# Patient Record
Sex: Male | Born: 1960
Health system: Southern US, Community
[De-identification: ages and names within clinical notes are randomized; demographics above are authoritative.]

## PROBLEM LIST (undated history)

## (undated) DIAGNOSIS — I10 Essential (primary) hypertension: Secondary | ICD-10-CM

## (undated) DIAGNOSIS — Z9289 Personal history of other medical treatment: Secondary | ICD-10-CM

## (undated) DIAGNOSIS — N186 End stage renal disease: Secondary | ICD-10-CM

## (undated) DIAGNOSIS — Z950 Presence of cardiac pacemaker: Secondary | ICD-10-CM

## (undated) DIAGNOSIS — I330 Acute and subacute infective endocarditis: Secondary | ICD-10-CM

## (undated) DIAGNOSIS — K219 Gastro-esophageal reflux disease without esophagitis: Secondary | ICD-10-CM

## (undated) DIAGNOSIS — D649 Anemia, unspecified: Secondary | ICD-10-CM

## (undated) DIAGNOSIS — I499 Cardiac arrhythmia, unspecified: Secondary | ICD-10-CM

## (undated) DIAGNOSIS — I079 Rheumatic tricuspid valve disease, unspecified: Secondary | ICD-10-CM

## (undated) DIAGNOSIS — M869 Osteomyelitis, unspecified: Secondary | ICD-10-CM

## (undated) DIAGNOSIS — D62 Acute posthemorrhagic anemia: Secondary | ICD-10-CM

## (undated) DIAGNOSIS — I96 Gangrene, not elsewhere classified: Secondary | ICD-10-CM

## (undated) DIAGNOSIS — D638 Anemia in other chronic diseases classified elsewhere: Secondary | ICD-10-CM

## (undated) DIAGNOSIS — I4891 Unspecified atrial fibrillation: Secondary | ICD-10-CM

## (undated) DIAGNOSIS — E785 Hyperlipidemia, unspecified: Secondary | ICD-10-CM

## (undated) DIAGNOSIS — J189 Pneumonia, unspecified organism: Secondary | ICD-10-CM

## (undated) DIAGNOSIS — I739 Peripheral vascular disease, unspecified: Secondary | ICD-10-CM

## (undated) DIAGNOSIS — E46 Unspecified protein-calorie malnutrition: Secondary | ICD-10-CM

## (undated) DIAGNOSIS — H269 Unspecified cataract: Secondary | ICD-10-CM

## (undated) DIAGNOSIS — M1990 Unspecified osteoarthritis, unspecified site: Secondary | ICD-10-CM

## (undated) DIAGNOSIS — I495 Sick sinus syndrome: Secondary | ICD-10-CM

## (undated) DIAGNOSIS — F1010 Alcohol abuse, uncomplicated: Secondary | ICD-10-CM

## (undated) DIAGNOSIS — R7989 Other specified abnormal findings of blood chemistry: Secondary | ICD-10-CM

## (undated) DIAGNOSIS — N189 Chronic kidney disease, unspecified: Secondary | ICD-10-CM

## (undated) DIAGNOSIS — R55 Syncope and collapse: Secondary | ICD-10-CM

## (undated) DIAGNOSIS — Z45018 Encounter for adjustment and management of other part of cardiac pacemaker: Secondary | ICD-10-CM

## (undated) DIAGNOSIS — I2510 Atherosclerotic heart disease of native coronary artery without angina pectoris: Secondary | ICD-10-CM

## (undated) HISTORY — DX: Acute and subacute infective endocarditis: I33.0

## (undated) HISTORY — DX: Anemia, unspecified: D64.9

## (undated) HISTORY — PX: EYE SURGERY: SHX253

## (undated) HISTORY — DX: Unspecified protein-calorie malnutrition: E46

## (undated) HISTORY — DX: Rheumatic tricuspid valve disease, unspecified: I07.9

## (undated) HISTORY — DX: Peripheral vascular disease, unspecified: I73.9

## (undated) HISTORY — DX: Anemia in other chronic diseases classified elsewhere: D63.8

## (undated) HISTORY — PX: COLONOSCOPY W/ POLYPECTOMY: SHX1380

## (undated) HISTORY — DX: Other specified abnormal findings of blood chemistry: R79.89

## (undated) HISTORY — DX: Acute posthemorrhagic anemia: D62

## (undated) HISTORY — DX: Unspecified cataract: H26.9

## (undated) HISTORY — DX: Chronic kidney disease, unspecified: N18.9

## (undated) HISTORY — DX: Hyperlipidemia, unspecified: E78.5

## (undated) HISTORY — DX: Alcohol abuse, uncomplicated: F10.10

## (undated) HISTORY — PX: INSERT / REPLACE / REMOVE PACEMAKER: SUR710

## (undated) HISTORY — PX: BACK SURGERY: SHX140

## (undated) HISTORY — DX: Gangrene, not elsewhere classified: I96

---
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
## 2004-06-14 ENCOUNTER — Emergency Department (HOSPITAL_COMMUNITY): Admission: EM | Admit: 2004-06-14 | Discharge: 2004-06-14 | Payer: Self-pay | Admitting: Emergency Medicine

## 2009-03-15 ENCOUNTER — Ambulatory Visit: Payer: Self-pay | Admitting: Family Medicine

## 2009-03-15 DIAGNOSIS — E119 Type 2 diabetes mellitus without complications: Secondary | ICD-10-CM | POA: Insufficient documentation

## 2009-03-15 DIAGNOSIS — I10 Essential (primary) hypertension: Secondary | ICD-10-CM | POA: Insufficient documentation

## 2009-03-15 DIAGNOSIS — F17200 Nicotine dependence, unspecified, uncomplicated: Secondary | ICD-10-CM | POA: Insufficient documentation

## 2009-03-15 DIAGNOSIS — E1122 Type 2 diabetes mellitus with diabetic chronic kidney disease: Secondary | ICD-10-CM | POA: Insufficient documentation

## 2009-03-15 DIAGNOSIS — R5381 Other malaise: Secondary | ICD-10-CM | POA: Insufficient documentation

## 2009-03-15 DIAGNOSIS — F1010 Alcohol abuse, uncomplicated: Secondary | ICD-10-CM | POA: Insufficient documentation

## 2009-03-15 DIAGNOSIS — R5383 Other fatigue: Secondary | ICD-10-CM

## 2009-03-16 LAB — CONVERTED CEMR LAB
ALT: 35 units/L (ref 0–53)
AST: 21 units/L (ref 0–37)
Albumin: 4 g/dL (ref 3.5–5.2)
Alkaline Phosphatase: 76 units/L (ref 39–117)
Basophils Absolute: 0 10*3/uL (ref 0.0–0.1)
Bilirubin, Direct: 0.2 mg/dL (ref 0.0–0.3)
Chloride: 104 meq/L (ref 96–112)
Creatinine,U: 62 mg/dL
Eosinophils Relative: 2.3 % (ref 0.0–5.0)
Hgb A1c MFr Bld: 10.1 % — ABNORMAL HIGH (ref 4.6–6.5)
Lymphs Abs: 2.9 10*3/uL (ref 0.7–4.0)
MCHC: 34.7 g/dL (ref 30.0–36.0)
Microalb Creat Ratio: 53.2 mg/g — ABNORMAL HIGH (ref 0.0–30.0)
Neutrophils Relative %: 49 % (ref 43.0–77.0)
Potassium: 4.3 meq/L (ref 3.5–5.1)
RBC: 4.39 M/uL (ref 4.22–5.81)
RDW: 11.5 % (ref 11.5–14.6)
Sodium: 139 meq/L (ref 135–145)
TSH: 2.08 microintl units/mL (ref 0.35–5.50)
Total Bilirubin: 0.9 mg/dL (ref 0.3–1.2)
Total Protein: 6.9 g/dL (ref 6.0–8.3)

## 2009-04-06 ENCOUNTER — Ambulatory Visit: Payer: Self-pay | Admitting: Family Medicine

## 2009-04-07 LAB — CONVERTED CEMR LAB
HDL: 62.8 mg/dL (ref >39.00)
LDL Cholesterol: 83 mg/dL (ref 0–99)
Total CHOL/HDL Ratio: 3

## 2009-04-14 ENCOUNTER — Ambulatory Visit: Payer: Self-pay | Admitting: Family Medicine

## 2009-04-14 DIAGNOSIS — E785 Hyperlipidemia, unspecified: Secondary | ICD-10-CM | POA: Insufficient documentation

## 2011-08-08 ENCOUNTER — Ambulatory Visit
Admission: RE | Admit: 2011-08-08 | Discharge: 2011-08-08 | Disposition: A | Discharge status: Home / Self Care | Payer: Managed Care, Other (non HMO) | Source: Ambulatory Visit | Attending: Physician Assistant | Admitting: Physician Assistant

## 2011-08-08 ENCOUNTER — Other Ambulatory Visit: Payer: Self-pay | Admitting: Physician Assistant

## 2011-08-08 DIAGNOSIS — M549 Dorsalgia, unspecified: Secondary | ICD-10-CM

## 2012-06-05 ENCOUNTER — Encounter (HOSPITAL_COMMUNITY): Payer: Self-pay | Admitting: Pharmacy Technician

## 2012-06-11 NOTE — Pre-Procedure Instructions (Addendum)
Alexander [name]   06/11/2012   Your procedure is scheduled on: Friday, August 2nd (12:30-15:32)              Report to [place] [facility] at  10:30 AM.  Call this number if you have problems the morning of surgery: [phone]   Remember:   Do not eat food OR drink any liquids:After Midnight Thursday.    Take these medicines the morning of surgery with A SIP OF WATER: Metoprolol   Do not wear jewelry, make-up or nail polish.  Do not wear lotions, powders, or perfumes. You may NOT wear deodorant.   Do not shave 48 hours prior to surgery. Men may shave face and neck.   Do not bring valuables to the hospital.   Contacts, dentures or bridgework may not be worn into surgery.   Leave suitcase in the car. After surgery it may be brought to your room.  For patients admitted to the hospital, checkout time is 11:00 AM the day of discharge.   Patients discharged the day of surgery will not be allowed to drive home.  Name and phone number of your driver:     Special Instructions: CHG Shower Use Special Wash: 1/2 bottle night before surgery and 1/2 bottle morning of surgery.   Please read over the following fact sheets that you were given: Pain Booklet, MRSA Information and Surgical Site Infection Prevention

## 2012-06-11 NOTE — Consult Note (Addendum)
Anesthesia Chart Review:  Patient is a 51 year old male scheduled for L2-3, L3-4, L4-5 laminectomy by Dr. Louanne Skye on 06/13/2012.  His PAT appointment is not until 06/12/2012. Today I am reviewing his medical clearance information and cardiac studies.  He was seen by Baruch Goldmann, PA-C with [facility] for medical clearance.   According to available records, his history includes smoking, diabetes mellitus type II, hypertension, daily use of alcohol, mild PVD, history of PVCs and chest pain, with normal nuclear stress test October 2012.  He was started on b-blocker and ACEI therapy.    His Cardiologist is Dr. [name].  He was last seen at [facility] on 04/02/12.  His EKG there on 03/03/12 showed SR with frequent PVCs, right BBB, LAD, bifascicular block.  He was noted to have bifascicular block on EKG at the time of his stress test on October 2012.    E[facility]) on 08/14/11 showed mild concentric LVH, LVEF 69%, mild MR.  Nuclear stress test on 08/14/11 showed normal myocardial perfusion demonstrating attenuation artifact in the inferior region of the myocardium.  No ischemia or infarct/scar seen in the remaining myocardium.    Additional results are pending following his PAT visit.  Myra Gianotti, PA-C 06/11/12 1600  Addendum: 06/12/12 1700 CBC, CMET reviewed.  UA and PCR are pending.  CXR from today showed no active cardiopulmonary disease.

## 2012-06-12 ENCOUNTER — Encounter (HOSPITAL_COMMUNITY): Payer: Self-pay

## 2012-06-12 ENCOUNTER — Encounter (HOSPITAL_COMMUNITY)
Admission: RE | Admit: 2012-06-12 | Discharge: 2012-06-12 | Disposition: A | Discharge status: Home / Self Care | Payer: Managed Care, Other (non HMO) | Source: Ambulatory Visit | Attending: Physician Assistant | Admitting: Physician Assistant

## 2012-06-12 ENCOUNTER — Encounter (HOSPITAL_COMMUNITY)
Admission: RE | Admit: 2012-06-12 | Discharge: 2012-06-12 | Disposition: A | Discharge status: Home / Self Care | Payer: Managed Care, Other (non HMO) | Source: Ambulatory Visit | Attending: Specialist | Admitting: Specialist

## 2012-06-12 DIAGNOSIS — M48062 Spinal stenosis, lumbar region with neurogenic claudication: Principal | ICD-10-CM | POA: Diagnosis present

## 2012-06-12 HISTORY — DX: Essential (primary) hypertension: I10

## 2012-06-12 HISTORY — DX: Cardiac arrhythmia, unspecified: I49.9

## 2012-06-12 LAB — COMPREHENSIVE METABOLIC PANEL
ALT: 31 U/L (ref 0–53)
AST: 17 U/L (ref 0–37)
Albumin: 3.8 g/dL (ref 3.5–5.2)
Alkaline Phosphatase: 64 U/L (ref 39–117)
BUN: 18 mg/dL (ref 6–23)
CO2: 28 mEq/L (ref 19–32)
GFR calc Af Amer: >90 mL/min (ref >90)
Potassium: 4.5 mEq/L (ref 3.5–5.1)
Sodium: 136 mEq/L (ref 135–145)
Total Protein: 7 g/dL (ref 6.0–8.3)

## 2012-06-12 LAB — URINALYSIS, ROUTINE W REFLEX MICROSCOPIC
Glucose, UA: 100 mg/dL — AB
Leukocytes, UA: NEGATIVE
Specific Gravity, Urine: 1.012 (ref 1.005–1.030)
pH: 6 (ref 5.0–8.0)

## 2012-06-12 LAB — CBC
MCH: 33.5 pg (ref 26.0–34.0)
Platelets: 195 10*3/uL (ref 150–400)
WBC: 9.6 10*3/uL (ref 4.0–10.5)

## 2012-06-12 MED ORDER — CHLORHEXIDINE GLUCONATE 4 % EX LIQD: 60.0000 mL | Freq: Once | CUTANEOUS | Status: DC

## 2012-06-12 MED ORDER — DEXTROSE 5 % IV SOLN
3.0000 g | INTRAVENOUS | Status: AC
  Administered 2012-06-13: 3 g via INTRAVENOUS
  Filled 2012-06-12 (×2): qty 3000

## 2012-06-12 NOTE — H&P (Signed)
Alexander [name] is an 51 y.o. male.   Chief Complaint: back pain and difficulty walking [id number] a 51 year old male.  He has been experiencing problems with his back and difficulties with walking for more than a year now.  He reports that he has noticed difficulty standing upright.  He tends to be stooped over.  Difficulties with walking a distance more than 100 feet.  Wife notes, and he notes, that he feels better if he is leaning over a cart.  His wife says that he has taken the cart from her whenever they are at the grocery store or otherwise; she does not get to push the cart any more.  He notices problems with feelings of weakness and pain into his buttocks and thighs down into the legs with numbness into the feet, especially into the top of his feet and all the toes.  He has a history of diabetes; is insulin dependent now.  Previously he had used oral medicines.  He does have decreased ability to stand upright for any period of time, decreased ability to ambulate a distance.  When he lies down at nighttime, he seems to do fairly well.  This limits his capacity to stand or walk any distance, limits his ability to exercise, which is important in light of his diabetes.  No bowel or bladder symptoms.  He does not take Motrin or Aleve.  Notes that when he first gets up in the morning that he has trouble standing upright or getting to an upright position.  His gait apparently is with a slap-type foot gait, wife notes, and this would be cnsistent with foot drop.  RADIOGRAPHS:  Plain radiographs of the lumbar spine from 03/17/2012:  AP radiograph shows a very slight curve at the lumbar spine, apex to left side at about the L2-3 level, some minimal lateral listhesis of L2 on L3 of about 2 or 3 mm versus spur here.  Lateral osteophytes present over multiple levels, extending from L1 to L4; degenerative disk narrowing noted on the AP radiograph.  The SI joints show some very mild sclerosis changes, left side greater  than right.  The patient's hip joints seen on the AP of the lumbar spine show the joint line is fairly well maintained, both sides.  Lateral radiograph of the lumbar spine shows degenerative disk narrowing present L5-S1, some very mild degenerative disk narrowing at L4-5 and L3-4; anterior osteophytes present over nearly every segment.  There is also some wedging of vertebra noted at the T12 level which may represent physiologic wedging versus an old compression deformity.  There is no sign of listhesis anteriorly or posteriorly at any level seen on this radiograph today.   ADVANCED IMAGING:  The  patient's MRI scan is reviewed.  The results of the MRI are also reviewed with him.  These studies demonstrate degenerative disk changes most significant at the L5-S1 level, but more importantly he has findings of rather significant lumbar spinal stenosis at L1-2, a multifactorial lumbar spinal stenosis secondary to disk bulge and joint enlargement with hypertrophy of ligaments causing narrowing of the spinal canal to the point where there is diminished filling of the thecal sac and CSF fluid here.  At L2-3, there is again moderately severe spinal stenosis, severe right greater than left lateral recess narrowing at L2-3 with a tiny bulge that is broad-based here.  At L3-4, he has a small broad-based disk bulge asymmetric to the right with slight hypertrophy of the ligamentum flavum here, and  this is causing moderately severe lumbar spinal stenosis and bilateral lateral recess stenosis, right side greater than left.  At L4-5, the most severe findings of lumbar spinal stenosis at this segment with significant thecal sac compression, compression into the corners of the spinal canals, both sides, and a broad-based disk protrusion, apparent anular tear causing marked thecal sac compression.  At L5-S1, there is a focal disk protrusion, accompanying osteophytes in lateral recess on the right side, right neural foramen without  significant impingement, and there is no compression of the S1 or L5 nerve roots.  Hypertrophy of the joints noted at this level with degenerative disk changes with modic changes involving the endplates on both sides at the L5-S1 disk.    Past Medical History  Diagnosis Date  . Hypertension   . Diabetes mellitus   . Dysrhythmia     h/o pvc's    No past surgical history on file.  No family history on file. Social History:  reports that he has been smoking Cigarettes.  He has a 30 pack-year smoking history. He does not have any smokeless tobacco history on file. He reports that he drinks about 21 ounces of alcohol per week. He reports that he does not use illicit drugs.  Allergies: No Known Allergies  No prescriptions prior to admission    No results found for this or any previous visit (from the past 48 hour(s)). Dg Chest 2 View  06/12/2012  *RADIOLOGY REPORT*  Clinical Data: Cough, hypertension, diabetes, smoker, preop evaluation for lumbar laminectomy  CHEST - 2 VIEW  Comparison:  None.  Findings:  The heart size and mediastinal contours are within normal limits.  Both lungs are clear.  The visualized skeletal structures are unremarkable.  IMPRESSION: No active cardiopulmonary disease.  Original Report Authenticated By: Jerilynn Mages. Daryll Brod, M.D.    Review of Systems  Constitutional: Negative.   HENT: Positive for neck pain.   Eyes: Negative.   Respiratory: Negative.   Cardiovascular: Negative.   Gastrointestinal: Negative.   Genitourinary: Negative.   Skin: Negative.   Neurological: Positive for sensory change.  Endo/Heme/Allergies: Negative.   Psychiatric/Behavioral: Negative.     There were no vitals taken for this visit. Physical Exam  Constitutional: He is oriented to person, place, and time. He appears well-developed and well-nourished.  HENT:  Head: Normocephalic and atraumatic.  Eyes: EOM are normal. Pupils are equal, round, and reactive to light.  Neck: Normal range  of motion. Neck supple.  Cardiovascular: Normal rate and intact distal pulses.   Murmur heard.      Irregular rate  Murmur heard throughout but does not radiate to neck  Respiratory: Effort normal and breath sounds normal.  GI: Soft. Bowel sounds are normal.  Musculoskeletal:        His gait, he is weak in any type of heel standing, both sides; toe standing intact.  He walks with a drop foot, both sides.  He has reflexes at the knee zero and symmetric, at the ankle zero and symmetric.  Sciatic tension tests are unremarkable.  His strength in knee extension, knee flexion is good.  Foot plantarflexion strength is good.  Dorsiflexion strength is weak.  The left side is 4/5, the right side 4+/5.  EHL is weak both sides at 4/5.  He does not show any sciatic tension signs clinically.  No particular numbness in his arms.  Numbness is present in his legs into the top of his feet, both sides, and also into the bottom  of his feet.   Neurological: He is alert and oriented to person, place, and time. He has normal reflexes.  Skin: Skin is warm and dry.  Psychiatric: He has a normal mood and affect.     Assessment/Plan Severe spinal stenosis L2-3,L3-4,and  L4-5  PLAN:  Central decompressive laminectomy L2-3 ,L3-4 and L4-5  Kaitlyn Franko M 06/12/2012, 3:49 PM

## 2012-06-13 ENCOUNTER — Encounter (HOSPITAL_COMMUNITY): Payer: Self-pay | Admitting: Specialist

## 2012-06-13 ENCOUNTER — Ambulatory Visit (HOSPITAL_COMMUNITY): Payer: Managed Care, Other (non HMO)

## 2012-06-13 ENCOUNTER — Ambulatory Visit (HOSPITAL_COMMUNITY): Payer: Managed Care, Other (non HMO) | Admitting: Vascular Surgery

## 2012-06-13 ENCOUNTER — Encounter (HOSPITAL_COMMUNITY)
Admission: RE | Disposition: A | Discharge status: Home Health | Payer: Self-pay | Source: Ambulatory Visit | Attending: Specialist

## 2012-06-13 ENCOUNTER — Encounter (HOSPITAL_COMMUNITY): Payer: Self-pay | Admitting: Vascular Surgery

## 2012-06-13 ENCOUNTER — Encounter (HOSPITAL_COMMUNITY): Payer: Self-pay | Admitting: *Deleted

## 2012-06-13 ENCOUNTER — Inpatient Hospital Stay (HOSPITAL_COMMUNITY)
Admission: RE | Admit: 2012-06-13 | Discharge: 2012-06-14 | DRG: 491 | Disposition: A | Discharge status: Home Health | Payer: Managed Care, Other (non HMO) | Source: Ambulatory Visit | Attending: Specialist | Admitting: Specialist

## 2012-06-13 DIAGNOSIS — M216X9 Other acquired deformities of unspecified foot: Secondary | ICD-10-CM | POA: Diagnosis present

## 2012-06-13 DIAGNOSIS — I10 Essential (primary) hypertension: Secondary | ICD-10-CM | POA: Diagnosis present

## 2012-06-13 DIAGNOSIS — E119 Type 2 diabetes mellitus without complications: Secondary | ICD-10-CM | POA: Diagnosis present

## 2012-06-13 DIAGNOSIS — M48062 Spinal stenosis, lumbar region with neurogenic claudication: Principal | ICD-10-CM | POA: Diagnosis present

## 2012-06-13 DIAGNOSIS — F17200 Nicotine dependence, unspecified, uncomplicated: Secondary | ICD-10-CM | POA: Diagnosis present

## 2012-06-13 DIAGNOSIS — M5126 Other intervertebral disc displacement, lumbar region: Secondary | ICD-10-CM | POA: Diagnosis present

## 2012-06-13 DIAGNOSIS — Z01812 Encounter for preprocedural laboratory examination: Secondary | ICD-10-CM

## 2012-06-13 HISTORY — PX: LUMBAR LAMINECTOMY: SHX95

## 2012-06-13 LAB — GLUCOSE, CAPILLARY
Glucose-Capillary: 181 mg/dL — ABNORMAL HIGH (ref 70–99)
Glucose-Capillary: 176 mg/dL — ABNORMAL HIGH (ref 70–99)

## 2012-06-13 SURGERY — MICRODISCECTOMY LUMBAR LAMINECTOMY
Anesthesia: General | Site: Spine Lumbar | Wound class: Clean

## 2012-06-13 MED ORDER — THERA M PLUS PO TABS: 1.0000 | ORAL_TABLET | Freq: Every day | ORAL | Status: DC

## 2012-06-13 MED ORDER — ACETAMINOPHEN 650 MG RE SUPP: 650.0000 mg | RECTAL | Status: DC | PRN

## 2012-06-13 MED ORDER — SENNOSIDES-DOCUSATE SODIUM 8.6-50 MG PO TABS: 1.0000 | ORAL_TABLET | Freq: Every evening | ORAL | Status: DC | PRN

## 2012-06-13 MED ORDER — SODIUM CHLORIDE 0.9 % IV SOLN: 250.0000 mL | INTRAVENOUS | Status: DC

## 2012-06-13 MED ORDER — BUPIVACAINE-EPINEPHRINE (PF) 0.5% -1:200000 IJ SOLN
INTRAMUSCULAR | Status: AC
  Filled 2012-06-13: qty 10

## 2012-06-13 MED ORDER — METHOCARBAMOL 500 MG PO TABS
ORAL_TABLET | ORAL | Status: AC
  Filled 2012-06-13: qty 1

## 2012-06-13 MED ORDER — POTASSIUM CHLORIDE IN NACL 20-0.9 MEQ/L-% IV SOLN
INTRAVENOUS | Status: DC
  Administered 2012-06-13: 20:00:00 via INTRAVENOUS
  Filled 2012-06-13 (×3): qty 1000

## 2012-06-13 MED ORDER — PROPOFOL 10 MG/ML IV EMUL
INTRAVENOUS | Status: DC | PRN
  Administered 2012-06-13: 200 mg via INTRAVENOUS

## 2012-06-13 MED ORDER — LACTATED RINGERS IV SOLN
INTRAVENOUS | Status: DC | PRN
  Administered 2012-06-13 (×2): via INTRAVENOUS

## 2012-06-13 MED ORDER — METOPROLOL SUCCINATE ER 50 MG PO TB24
75.0000 mg | ORAL_TABLET | Freq: Every day | ORAL | Status: DC
  Administered 2012-06-14: 75 mg via ORAL
  Filled 2012-06-13: qty 1

## 2012-06-13 MED ORDER — INSULIN ASPART 100 UNIT/ML ~~LOC~~ SOLN
0.0000 [IU] | Freq: Three times a day (TID) | SUBCUTANEOUS | Status: DC
  Administered 2012-06-14: 5 [IU] via SUBCUTANEOUS
  Administered 2012-06-14: 8 [IU] via SUBCUTANEOUS

## 2012-06-13 MED ORDER — HYDROCODONE-ACETAMINOPHEN 5-325 MG PO TABS: 1.0000 | ORAL_TABLET | ORAL | Status: DC | PRN

## 2012-06-13 MED ORDER — SODIUM CHLORIDE 0.9 % IJ SOLN: 3.0000 mL | INTRAMUSCULAR | Status: DC | PRN

## 2012-06-13 MED ORDER — ACETAMINOPHEN 10 MG/ML IV SOLN
INTRAVENOUS | Status: AC
  Filled 2012-06-13: qty 100

## 2012-06-13 MED ORDER — METHOCARBAMOL 100 MG/ML IJ SOLN
500.0000 mg | Freq: Four times a day (QID) | INTRAMUSCULAR | Status: DC | PRN
  Filled 2012-06-13: qty 5

## 2012-06-13 MED ORDER — KETOROLAC TROMETHAMINE 30 MG/ML IJ SOLN
INTRAMUSCULAR | Status: AC
  Filled 2012-06-13: qty 1

## 2012-06-13 MED ORDER — BUPIVACAINE-EPINEPHRINE PF 0.5-1:200000 % IJ SOLN
INTRAMUSCULAR | Status: DC | PRN
  Administered 2012-06-13: 18 mL

## 2012-06-13 MED ORDER — NEOSTIGMINE METHYLSULFATE 1 MG/ML IJ SOLN
INTRAMUSCULAR | Status: DC | PRN
  Administered 2012-06-13: 5 mg via INTRAVENOUS

## 2012-06-13 MED ORDER — OXYCODONE-ACETAMINOPHEN 5-325 MG PO TABS
ORAL_TABLET | ORAL | Status: AC
  Filled 2012-06-13: qty 2

## 2012-06-13 MED ORDER — THROMBIN 20000 UNITS EX KIT
PACK | CUTANEOUS | Status: DC | PRN
  Administered 2012-06-13: 15:00:00 via TOPICAL

## 2012-06-13 MED ORDER — PANTOPRAZOLE SODIUM 40 MG PO TBEC
40.0000 mg | DELAYED_RELEASE_TABLET | Freq: Every day | ORAL | Status: DC
  Administered 2012-06-13: 40 mg via ORAL
  Filled 2012-06-13: qty 1

## 2012-06-13 MED ORDER — HEPARIN SODIUM (PORCINE) 1000 UNIT/ML IJ SOLN
INTRAMUSCULAR | Status: AC
  Filled 2012-06-13: qty 1

## 2012-06-13 MED ORDER — THROMBIN 20000 UNITS EX SOLR
CUTANEOUS | Status: AC
  Filled 2012-06-13: qty 20000

## 2012-06-13 MED ORDER — KETOROLAC TROMETHAMINE 30 MG/ML IJ SOLN
30.0000 mg | Freq: Once | INTRAMUSCULAR | Status: AC
  Administered 2012-06-13: 30 mg via INTRAVENOUS

## 2012-06-13 MED ORDER — ASPIRIN EC 81 MG PO TBEC
81.0000 mg | DELAYED_RELEASE_TABLET | Freq: Every day | ORAL | Status: DC
  Administered 2012-06-14: 81 mg via ORAL
  Filled 2012-06-13: qty 1

## 2012-06-13 MED ORDER — ONDANSETRON HCL 4 MG/2ML IJ SOLN: 4.0000 mg | INTRAMUSCULAR | Status: DC | PRN

## 2012-06-13 MED ORDER — METHOCARBAMOL 500 MG PO TABS
500.0000 mg | ORAL_TABLET | Freq: Four times a day (QID) | ORAL | Status: DC | PRN
  Administered 2012-06-13: 500 mg via ORAL

## 2012-06-13 MED ORDER — LISINOPRIL-HYDROCHLOROTHIAZIDE 20-12.5 MG PO TABS: 2.0000 | ORAL_TABLET | Freq: Every day | ORAL | Status: DC

## 2012-06-13 MED ORDER — LIDOCAINE HCL (CARDIAC) 20 MG/ML IV SOLN
INTRAVENOUS | Status: DC | PRN
  Administered 2012-06-13: 100 mg via INTRAVENOUS

## 2012-06-13 MED ORDER — FLEET ENEMA 7-19 GM/118ML RE ENEM: 1.0000 | ENEMA | Freq: Once | RECTAL | Status: AC | PRN

## 2012-06-13 MED ORDER — PHENOL 1.4 % MT LIQD: 1.0000 | OROMUCOSAL | Status: DC | PRN

## 2012-06-13 MED ORDER — PANTOPRAZOLE SODIUM 40 MG IV SOLR
40.0000 mg | Freq: Every day | INTRAVENOUS | Status: DC
  Filled 2012-06-13 (×2): qty 40

## 2012-06-13 MED ORDER — MORPHINE SULFATE 2 MG/ML IJ SOLN
INTRAMUSCULAR | Status: AC
  Filled 2012-06-13: qty 1

## 2012-06-13 MED ORDER — CEFAZOLIN SODIUM 1-5 GM-% IV SOLN
1.0000 g | Freq: Three times a day (TID) | INTRAVENOUS | Status: AC
  Administered 2012-06-13 – 2012-06-14 (×2): 1 g via INTRAVENOUS
  Filled 2012-06-13 (×2): qty 50

## 2012-06-13 MED ORDER — OXYCODONE-ACETAMINOPHEN 5-325 MG PO TABS
1.0000 | ORAL_TABLET | ORAL | Status: DC | PRN
  Administered 2012-06-13: 2 via ORAL

## 2012-06-13 MED ORDER — PHENYLEPHRINE HCL 10 MG/ML IJ SOLN
INTRAMUSCULAR | Status: DC | PRN
  Administered 2012-06-13 (×3): 40 ug via INTRAVENOUS

## 2012-06-13 MED ORDER — FENTANYL CITRATE 0.05 MG/ML IJ SOLN
INTRAMUSCULAR | Status: DC | PRN
  Administered 2012-06-13: 200 ug via INTRAVENOUS
  Administered 2012-06-13: 50 ug via INTRAVENOUS

## 2012-06-13 MED ORDER — HYDROMORPHONE HCL PF 1 MG/ML IJ SOLN: 0.2500 mg | INTRAMUSCULAR | Status: DC | PRN

## 2012-06-13 MED ORDER — ACETAMINOPHEN 325 MG PO TABS: 650.0000 mg | ORAL_TABLET | ORAL | Status: DC | PRN

## 2012-06-13 MED ORDER — ACETAMINOPHEN 10 MG/ML IV SOLN
INTRAVENOUS | Status: DC | PRN
  Administered 2012-06-13: 1000 mg via INTRAVENOUS

## 2012-06-13 MED ORDER — SODIUM CHLORIDE 0.9 % IJ SOLN: 3.0000 mL | Freq: Two times a day (BID) | INTRAMUSCULAR | Status: DC

## 2012-06-13 MED ORDER — ROCURONIUM BROMIDE 100 MG/10ML IV SOLN
INTRAVENOUS | Status: DC | PRN
  Administered 2012-06-13: 30 mg via INTRAVENOUS
  Administered 2012-06-13: 50 mg via INTRAVENOUS
  Administered 2012-06-13 (×2): 10 mg via INTRAVENOUS

## 2012-06-13 MED ORDER — KETOROLAC TROMETHAMINE 30 MG/ML IJ SOLN: 15.0000 mg | Freq: Once | INTRAMUSCULAR | Status: DC | PRN

## 2012-06-13 MED ORDER — 0.9 % SODIUM CHLORIDE (POUR BTL) OPTIME
TOPICAL | Status: DC | PRN
  Administered 2012-06-13: 1000 mL

## 2012-06-13 MED ORDER — MIDAZOLAM HCL 5 MG/5ML IJ SOLN
INTRAMUSCULAR | Status: DC | PRN
  Administered 2012-06-13: 2 mg via INTRAVENOUS

## 2012-06-13 MED ORDER — VECURONIUM BROMIDE 10 MG IV SOLR
INTRAVENOUS | Status: DC | PRN
  Administered 2012-06-13 (×2): 2 mg via INTRAVENOUS

## 2012-06-13 MED ORDER — DOCUSATE SODIUM 100 MG PO CAPS
100.0000 mg | ORAL_CAPSULE | Freq: Two times a day (BID) | ORAL | Status: DC
  Administered 2012-06-13 – 2012-06-14 (×2): 100 mg via ORAL
  Filled 2012-06-13 (×3): qty 1

## 2012-06-13 MED ORDER — PROMETHAZINE HCL 25 MG/ML IJ SOLN: 6.2500 mg | INTRAMUSCULAR | Status: DC | PRN

## 2012-06-13 MED ORDER — ONDANSETRON HCL 4 MG/2ML IJ SOLN
INTRAMUSCULAR | Status: DC | PRN
  Administered 2012-06-13: 4 mg via INTRAVENOUS

## 2012-06-13 MED ORDER — GLYCOPYRROLATE 0.2 MG/ML IJ SOLN
INTRAMUSCULAR | Status: DC | PRN
  Administered 2012-06-13: 1 mg via INTRAVENOUS

## 2012-06-13 MED ORDER — BISACODYL 10 MG RE SUPP: 10.0000 mg | Freq: Every day | RECTAL | Status: DC | PRN

## 2012-06-13 MED ORDER — METFORMIN HCL 500 MG PO TABS
1000.0000 mg | ORAL_TABLET | Freq: Every day | ORAL | Status: DC
Start: 2012-06-15 — End: 2012-06-14

## 2012-06-13 MED ORDER — MORPHINE SULFATE 2 MG/ML IJ SOLN
1.0000 mg | INTRAMUSCULAR | Status: DC | PRN
  Administered 2012-06-13: 2 mg via INTRAVENOUS

## 2012-06-13 MED ORDER — MEPERIDINE HCL 25 MG/ML IJ SOLN: 6.2500 mg | INTRAMUSCULAR | Status: DC | PRN

## 2012-06-13 MED ORDER — LISINOPRIL 20 MG PO TABS
20.0000 mg | ORAL_TABLET | Freq: Every day | ORAL | Status: DC
  Administered 2012-06-13 – 2012-06-14 (×2): 20 mg via ORAL
  Filled 2012-06-13 (×3): qty 1

## 2012-06-13 MED ORDER — MENTHOL 3 MG MT LOZG: 1.0000 | LOZENGE | OROMUCOSAL | Status: DC | PRN

## 2012-06-13 MED ORDER — ADULT MULTIVITAMIN W/MINERALS CH
1.0000 | ORAL_TABLET | Freq: Every day | ORAL | Status: DC
  Administered 2012-06-13 – 2012-06-14 (×2): 1 via ORAL
  Filled 2012-06-13 (×2): qty 1

## 2012-06-13 MED ORDER — ZOLPIDEM TARTRATE 5 MG PO TABS: 5.0000 mg | ORAL_TABLET | Freq: Every evening | ORAL | Status: DC | PRN

## 2012-06-13 SURGICAL SUPPLY — 53 items
BUR RND FLUTED 2.5 (BURR) IMPLANT
BUR ROUND FLUTED 4 SOFT TCH (BURR) IMPLANT
BUR SABER RD CUTTING 3.0 (BURR) ×2 IMPLANT
CANISTER SUCTION 2500CC (MISCELLANEOUS) ×2 IMPLANT
CLOTH BEACON ORANGE TIMEOUT ST (SAFETY) ×2 IMPLANT
CORDS BIPOLAR (ELECTRODE) ×2 IMPLANT
COVER SURGICAL LIGHT HANDLE (MISCELLANEOUS) ×2 IMPLANT
DERMABOND ADVANCED (GAUZE/BANDAGES/DRESSINGS) ×1
DERMABOND ADVANCED .7 DNX12 (GAUZE/BANDAGES/DRESSINGS) ×1 IMPLANT
DRAPE C-ARM 42X72 X-RAY (DRAPES) ×2 IMPLANT
DRAPE MICROSCOPE LEICA (MISCELLANEOUS) ×2 IMPLANT
DRAPE POUCH INSTRU U-SHP 10X18 (DRAPES) ×2 IMPLANT
DRAPE PROXIMA HALF (DRAPES) IMPLANT
DRAPE SURG 17X23 STRL (DRAPES) ×8 IMPLANT
DRSG MEPILEX BORDER 4X4 (GAUZE/BANDAGES/DRESSINGS) ×2 IMPLANT
DRSG MEPILEX BORDER 4X8 (GAUZE/BANDAGES/DRESSINGS) ×2 IMPLANT
DURAPREP 26ML APPLICATOR (WOUND CARE) ×2 IMPLANT
ELECT BLADE 4.0 EZ CLEAN MEGAD (MISCELLANEOUS) ×2
ELECT CAUTERY BLADE 6.4 (BLADE) ×2 IMPLANT
ELECT REM PT RETURN 9FT ADLT (ELECTROSURGICAL) ×2
ELECTRODE BLDE 4.0 EZ CLN MEGD (MISCELLANEOUS) ×1 IMPLANT
ELECTRODE REM PT RTRN 9FT ADLT (ELECTROSURGICAL) ×1 IMPLANT
EVACUATOR 1/8 PVC DRAIN (DRAIN) ×2 IMPLANT
GLOVE BIOGEL PI IND STRL 7.5 (GLOVE) ×1 IMPLANT
GLOVE BIOGEL PI INDICATOR 7.5 (GLOVE) ×1
GLOVE ECLIPSE 7.0 STRL STRAW (GLOVE) ×2 IMPLANT
GLOVE ECLIPSE 8.5 STRL (GLOVE) ×2 IMPLANT
GLOVE SURG 8.5 LATEX PF (GLOVE) ×2 IMPLANT
GOWN PREVENTION PLUS LG XLONG (DISPOSABLE) IMPLANT
GOWN PREVENTION PLUS XXLARGE (GOWN DISPOSABLE) ×2 IMPLANT
GOWN STRL NON-REIN LRG LVL3 (GOWN DISPOSABLE) ×4 IMPLANT
KIT BASIN OR (CUSTOM PROCEDURE TRAY) ×2 IMPLANT
KIT ROOM TURNOVER OR (KITS) ×2 IMPLANT
NEEDLE 22X1 1/2 (OR ONLY) (NEEDLE) ×2 IMPLANT
NEEDLE SPNL 18GX3.5 QUINCKE PK (NEEDLE) ×4 IMPLANT
NS IRRIG 1000ML POUR BTL (IV SOLUTION) ×2 IMPLANT
PACK LAMINECTOMY ORTHO (CUSTOM PROCEDURE TRAY) ×2 IMPLANT
PAD ARMBOARD 7.5X6 YLW CONV (MISCELLANEOUS) ×4 IMPLANT
PATTIES SURGICAL .5 X.5 (GAUZE/BANDAGES/DRESSINGS) IMPLANT
PATTIES SURGICAL .75X.75 (GAUZE/BANDAGES/DRESSINGS) IMPLANT
SPONGE LAP 4X18 X RAY DECT (DISPOSABLE) IMPLANT
SPONGE SURGIFOAM ABS GEL 100 (HEMOSTASIS) IMPLANT
SUT VIC AB 1 CT1 27 (SUTURE) ×1
SUT VIC AB 1 CT1 27XBRD ANBCTR (SUTURE) ×1 IMPLANT
SUT VIC AB 2-0 CT1 27 (SUTURE)
SUT VIC AB 2-0 CT1 TAPERPNT 27 (SUTURE) IMPLANT
SUT VICRYL 0 UR6 27IN ABS (SUTURE) IMPLANT
SUT VICRYL 4-0 PS2 18IN ABS (SUTURE) IMPLANT
SYR CONTROL 10ML LL (SYRINGE) ×2 IMPLANT
TOWEL OR 17X24 6PK STRL BLUE (TOWEL DISPOSABLE) ×2 IMPLANT
TOWEL OR 17X26 10 PK STRL BLUE (TOWEL DISPOSABLE) ×2 IMPLANT
TRAY FOLEY CATH 14FR (SET/KITS/TRAYS/PACK) IMPLANT
WATER STERILE IRR 1000ML POUR (IV SOLUTION) ×2 IMPLANT

## 2012-06-13 NOTE — Op Note (Addendum)
06/13/2012  4:53 PM  PATIENT:  Alexander [name]  51 y.o. male  MRN: [id number]  OPERATIVE REPORT  PRE-OPERATIVE DIAGNOSIS:  Severe lumbar stenosis L2-3, L3-4, L4-5 and HNP left L4-5  POST-OPERATIVE DIAGNOSIS:  Severe lumbar stenosis L2-3, L3-4, L4-5 and HNP left L4-5  PROCEDURE:  Procedure(s): MICRODISCECTOMY LUMBAR LAMINECTOMY L4-5 left, central laminectomy L2-3 L3-4 and L4-5. Operating room microscope used for this procedure.    SURGEON:  Jessy Oto, MD     ASSISTANT:  Phillips Hay, PA-C  (Present throughout the entire procedure and necessary for completion of procedure in a timely manner)     ANESTHESIA:  General, Dr. Ocie Doyne    COMPLICATIONS:  None.     DRAINS: Foley to straight drain, Hemovac right lower lumbar x1.   PROCEDURE:  The patient was met in the holding area, and the appropriate L2-3 to L4-5 levels identified and marked with an "X" and my initials. The patient was then transported to OR and was placed under general anesthesia without difficulty. Then placed on the operative table in a prone position. The [facility] spine OR table was used. The patient received appropriate preoperative antibiotic prophylaxis. Nursing staff inserted a Foley catheter under sterile conditions prior to turning. Standard prep with DuraPrep solution from the mid dorsal spine to the mid sacral level.Time-out procedure was called and correct .  Patient was draped in the usual manner. Iodine Vi-Drape was used. 2 x 18-gauge spinal needles were placed at the expected and of the expected levels. Intraoperative lateral radiograph demonstrated the upper needle at the L2-3 level and the lower needle placed posterior to the L4-5 level. Initial incisions were made at the very superior aspect of the previous incision scar and this was at first palpable process representing L2 and at the lowest aspect of the incision at the expected level of L4-5 approximately 2 1/2 - 3 inches in length.  The incision were  infiltrated with Marcaine half percent with 1-200,000 epinephrine. Total of 10 cc were used.  Skin subcutaneous layers divide down to the lumbodorsal fascia this was incised in both L2 spinous process and lumbosacral spinous process and clamps placed at the interspinous process at L2-3 and L3-4. A lateral radiograph demonstrated the upper clamp at the L2-3 interspinous space. Lower clamp was at the L3-4 interspinous space. The L2-3 to L4-5 levels were then exposed using Cobb elevators and electrocautery. These areas were then packed. Boss McCollough retractor was inserted at the incision site. Leksell rongeur used to remove a small portion of the inferior aspect is process of L2 and each of the spinous processes of L3 and L4. Leksell rongeur was then used to further remove bone down to the ligamentum flavum and the central portions of the lamina and base of the spinous process were thinned. This facets were then exposed out laterally and were hypertrophic.Osteotomes then used medial aspect of the L3-4 facet and L4-5 facet bilaterally resecting approximately 10-15% of the facet bilaterally. Kerrisons were then used to resect central portions of the lamina of L4 and L3. Ligamentum flavum at the L2-3 L3-4 and L4-5 levels were then carefully resected centrally preserving at least 7-8 mm with at the pars level L4 and L3. The central laminectomy was further widened performing more resection of the medial aspect of facet at both L4-5 and L3-4.  Note that loupe magnification and headlight was used for this portion procedure.the central portions of the ligamentum flavum at the L2-3 level were resected and the medial  aspect of the L2-3 facet resected over about 5-10%. Hypertrophic flava was found to be present. The operating room microscope sterilely draped brought into the field and then carefully the right side decompressed at each level beginning at the right L5 neuroforamen resect bone over the superior lamina of L5 and  decompressing the right L5 foramen and reflected portion ligamentum flavum off the medial aspect of the right L4-5 facet. Hockey-stick nerve probe could be passed out both the L4 and L5 neuroforamen. The medial aspect of facet at the L3-4 level was then carefully evaluated and hypertrophic ligamentum flavum resected using Kerrisons decompressing the lateral recess on this right side. This was done such that hockey-stick nerve probe could be used to pass the outer recess demonstrating patency and decompression of the L4 nerve root. Attention then turned to the L2-3 were further debridement of the lateral recess and hypertrophic ligamentum flavum and reflected portions of ligamentum flavum were carried out. Changing sides of the OR table in decompression was carried out in similar fashion on the left side from the left L5 nerve root to the L2-3 level. At the left L4-5 level severe lateral recess stenosis was noted lateral recess was decompressed and the L5 nerve root freed up this protrusion was noted into the left lateral recess and posterior aspect disc on the left. Under the OR microscope Derricho retractor used to retract the thecal sac on the left side and the left L4-5 disc herniation exposed over the posterior aspect of the L4-5 disc. Retracting carefully then 15 blade scalpel was used to incise the disc vertically and both nerve hook and micropituitary rongeur used to debris subligamentous disc herniation and from the left and central portion of the posterior aspect of the disc. The disc was removed so as to decompress the left lateral recess and no further disc material could be obtained from the subligamentous area. This was a minimal this excision. Further decompression was then carried out the left side of the spinal canal decompressing the L 4, the L3 and the L2 and of of a nerve roots. Following this then a hockey-stick nerve probe could be passed out easily bilateral L2-L3 and L4 and 5 neuroforamen.  Following decompression bilaterally thrombin-soaked Gelfoam was applied to the laminotomy defects and these areas packed with small sponges. Adequate hemostasis was obtained at all levels. The thrombin-soaked Gelfoam was then removed and a medium Hemovac drain placed in the depths the incision the tip of the drain above the L2 lamina on the right side. Exiting on the right lower lumbar spine.  Boss McCollough retractor was then removed. The end of the case a approximately 15 mm the central laminotomy was present with normal pulsation of the thecal sac present. Following additional irrigation and with no active bleeding present at any level level, the incision was then closed by first approximating her lumbar muscles the midline with interrupted #1 Vicryl sutures loosely the lumbodorsal fascia then approximated with interrupted #1 Vicryl sutures. Deep subcutaneous layers approximated with interrupted #1-0 Vicryl suture more superficial layers with interrupted 2-0 Vicryl suture the skin closed with a running subcutaneous stitch of 4-0 Vicryl at both levels. All Mepilex bandage is applied to both sites. All instrument and sponge counts were correct. Patient was then reactivated returned to supine position and extubated. He was then returned to recovery room in satisfactory condition.  Phillips Hay PA-C for the duties of assistant surgeon throughout this case she assisted loupe magnification retracting delicate neural structures suctioning over  the counter neural structures throughout the cane bilaterally at the lower bar segment and superiorly. Is present from the beginning of the case to the end of the case. Assisted in positioning the patient having in positioning the arm and legs. She also participated in removal the patient from the operating table.        Ophelia Sipe E  06/13/2012, 4:53 PM

## 2012-06-13 NOTE — Anesthesia Postprocedure Evaluation (Signed)
  Anesthesia Post-op Note  Patient: Alexander [name]  Procedure(s) Performed: Procedure(s) (LRB): MICRODISCECTOMY LUMBAR LAMINECTOMY (N/A)  Patient Location: PACU  Anesthesia Type: General  Level of Consciousness: awake, alert , oriented and patient cooperative  Airway and Oxygen Therapy: Patient Spontanous Breathing and Patient connected to nasal cannula oxygen  Post-op Pain: mild  Post-op Assessment: Post-op Vital signs reviewed, Patient's Cardiovascular Status Stable, Respiratory Function Stable, Patent Airway, No signs of Nausea or vomiting and Pain level controlled  Post-op Vital Signs: stable  Complications: No apparent anesthesia complications

## 2012-06-13 NOTE — H&P (Signed)
Patient was seen and examined in the preop holding area. There has been no interval  Change in this patient's exam preop  history and physical exam  Lab tests and images have been examined and reviewed.  The Risks benefits and alternative treatments have been discussed  extensively,questions answered.  The patient has elected to undergo the discussed surgical treatment. 

## 2012-06-13 NOTE — Brief Op Note (Signed)
06/13/2012  4:16 PM  PATIENT:  Alexander [name]  51 y.o. male  PRE-OPERATIVE DIAGNOSIS:  Severe lumbar stenosis L2-3, L3-4, L4-5 and HNP L4-5 left  POST-OPERATIVE DIAGNOSIS:  Severe lumbar stenosis L2-3, L3-4, L4-5 and HNP L4-5 left  PROCEDURE:  Procedure(s) (LRB): MICRODISCECTOMY LUMBAR LAMINECTOMY (N/A) central decompressive laminectomy L2-3, L3-4 and L4-5 with bilateral foraminotomies L2 L3-L4 and L5. Left L4-5 minimal microdiscectomy. Microscope was used for this procedure.  SURGEON:  Surgeon(s) and Role:    * Jessy Oto, MD - Primary  PHYSICIAN ASSISTANT: Phillips Hay PA-C   ANESTHESIA:   local and general, Dr. Ocie Doyne  EBL:  Total I/O In: 1000 [I.V.:1000] Out: 450 [Urine:450]  BLOOD ADMINISTERED:none  DRAINS: (1) Hemovact drain(s) in the Right lower lumbar draining incision with  Suction Open   LOCAL MEDICATIONS USED:  MARCAINE    and Amount: 20 ml  SPECIMEN:  No Specimen  DISPOSITION OF SPECIMEN:  N/A  COUNTS:  YES  TOURNIQUET:  * No tourniquets in log *  DICTATION: .Dragon Dictation  PLAN OF CARE: Admit to inpatient   PATIENT DISPOSITION:  PACU - hemodynamically stable.   Delay start of Pharmacological VTE agent (>24hrs) due to surgical blood loss or risk of bleeding: yes

## 2012-06-13 NOTE — Anesthesia Preprocedure Evaluation (Addendum)
Anesthesia Evaluation  Patient identified by MRN, date of birth, ID band Patient awake    Reviewed: Allergy & Precautions, H&P , NPO status , Patient's Chart, lab work & pertinent test results  History of Anesthesia Complications Negative for: history of anesthetic complications  Airway Mallampati: II  Neck ROM: Full    Dental  (+) Missing and Poor Dentition   Pulmonary neg pulmonary ROS, Current Smoker,  breath sounds clear to auscultation        Cardiovascular hypertension, + dysrhythmias Rhythm:Irregular Rate:Normal + Systolic murmurs Abnormal EKG, PVCs   Neuro/Psych    GI/Hepatic negative GI ROS, Neg liver ROS,   Endo/Other    Renal/GU      Musculoskeletal   Abdominal (+) + obese,   Peds  Hematology   Anesthesia Other Findings   Reproductive/Obstetrics                          Anesthesia Physical Anesthesia Plan  ASA: II  Anesthesia Plan:    Post-op Pain Management:    Induction: Intravenous  Airway Management Planned: Oral ETT  Additional Equipment:   Intra-op Plan:   Post-operative Plan: Extubation in OR  Informed Consent: I have reviewed the patients History and Physical, chart, labs and discussed the procedure including the risks, benefits and alternatives for the proposed anesthesia with the patient or authorized representative who has indicated his/her understanding and acceptance.   Dental advisory given  Plan Discussed with: CRNA and Surgeon  Anesthesia Plan Comments:         Anesthesia Quick Evaluation

## 2012-06-13 NOTE — H&P (Signed)
Patient examined and lab reviewed with Vernon PA-C. 

## 2012-06-13 NOTE — Preoperative (Signed)
Beta Blockers   Reason not to administer Beta Blockers:Not Applicable 

## 2012-06-13 NOTE — Transfer of Care (Signed)
Immediate Anesthesia Transfer of Care Note  Patient: Alexander [name]  Procedure(s) Performed: Procedure(s) (LRB): MICRODISCECTOMY LUMBAR LAMINECTOMY (N/A)  Patient Location: PACU  Anesthesia Type: General  Level of Consciousness: awake and alert   Airway & Oxygen Therapy: Patient Spontanous Breathing  Post-op Assessment: Report given to PACU RN and Post -op Vital signs reviewed and stable  Post vital signs: Reviewed and stable  Complications: No apparent anesthesia complications22

## 2012-06-13 NOTE — Brief Op Note (Deleted)
06/13/2012  4:47 PM  PATIENT:  Alexander [name]  51 y.o. male  PRE-OPERATIVE DIAGNOSIS:  Severe lumbar stenosis L2-3, L3-4, L4-5 and HNP L4-5  POST-OPERATIVE DIAGNOSIS:  Severe lumbar stenosis L2-3, L3-4, L4-5 and HNP L4-5  PROCEDURE:  Procedure(s) (LRB): MICRODISCECTOMY LUMBAR LAMINECTOMY (N/A) Central decompressive laminectomy at L2-3 , L3-4 and L4-5 SURGEON:  Surgeon(s) and Role:    * Jessy Oto, MD - Primary  PHYSICIAN ASSISTANT: Delman Cheadle  ASSISTANTS: none   ANESTHESIA:   general  EBL:  Total I/O In: 1000 [I.V.:1000] Out: 560 [Urine:500; Blood:60]  BLOOD ADMINISTERED:none  DRAINS: (one) Hemovact drain(s) in the back with  Suction Open   LOCAL MEDICATIONS USED:  MARCAINE     SPECIMEN:  No Specimen  DISPOSITION OF SPECIMEN:  N/A  COUNTS:  YES  TOURNIQUET:  * No tourniquets in log *  DICTATION: .Dragon Dictation  PLAN OF CARE: Admit to inpatient   PATIENT DISPOSITION:  PACU - hemodynamically stable.   Delay start of Pharmacological VTE agent (>24hrs) due to surgical blood loss or risk of bleeding: yes

## 2012-06-13 NOTE — Progress Notes (Signed)
Main pharmacy will send pt's antibiotic to or.

## 2012-06-14 LAB — GLUCOSE, CAPILLARY

## 2012-06-14 MED ORDER — OXYCODONE-ACETAMINOPHEN 5-325 MG PO TABS: 1.0000 | ORAL_TABLET | Freq: Four times a day (QID) | ORAL | Status: AC | PRN

## 2012-06-14 MED ORDER — SENNOSIDES-DOCUSATE SODIUM 8.6-50 MG PO TABS: 1.0000 | ORAL_TABLET | Freq: Every evening | ORAL | Status: DC | PRN

## 2012-06-14 MED ORDER — DSS 100 MG PO CAPS: 100.0000 mg | ORAL_CAPSULE | Freq: Two times a day (BID) | ORAL | Status: DC

## 2012-06-14 MED ORDER — METHOCARBAMOL 500 MG PO TABS: 500.0000 mg | ORAL_TABLET | Freq: Four times a day (QID) | ORAL | Status: DC | PRN

## 2012-06-14 NOTE — Progress Notes (Signed)
D/c instructions reviewed with patient and daughter. RX x 4 given. No hh equipment or services needed. All questions answered. Pt d/c'ed via wheelchair in stable condition

## 2012-06-14 NOTE — Evaluation (Signed)
Physical Therapy Evaluation Patient Details Name: Alexander [name] MRN: [id number] DOB: June 13, 1961 Today's Date: 06/14/2012 Time: [id number] PT Time Calculation (min): 20 min  PT Assessment / Plan / Recommendation Clinical Impression  Pt is a 51 y.o. male who underwent laminectomy/decompression sx 06-13-12.  He is doing well and motivated to participate in PT.  PT indicated for increased mobility and education to ensure safe d/c home with wife.    PT Assessment  Patient needs continued PT services    Follow Up Recommendations  No PT follow up    Barriers to Discharge None      Equipment Recommendations  None recommended by PT    Recommendations for Other Services     Frequency Min 5X/week    Precautions / Restrictions Precautions Precautions: Back Precaution Comments: Educated pt on 3/3 back precautions. Restrictions Weight Bearing Restrictions: No   Pertinent Vitals/Pain 3/10      Mobility  Bed Mobility Bed Mobility: Rolling Left;Left Sidelying to Sit Rolling Left: 6: Modified independent (Device/Increase time) Left Sidelying to Sit: HOB flat;4: Min guard Details for Bed Mobility Assistance: verbal cues for sequencing, precautions Transfers Transfers: Sit to Stand;Stand to Sit Sit to Stand: 4: Min guard;From bed;With upper extremity assist Stand to Sit: 4: Min guard;To chair/3-in-1;With armrests Details for Transfer Assistance: verbal cues for sequencing, safety Ambulation/Gait Ambulation/Gait Assistance: 4: Min guard Ambulation Distance (Feet): 200 Feet Assistive device: Rolling walker Ambulation/Gait Assistance Details: verbal cues for RW management Gait Pattern: Step-through pattern;Decreased dorsiflexion - left;Decreased dorsiflexion - right    Exercises     PT Diagnosis: Difficulty walking;Acute pain  PT Problem List: Decreased mobility;Pain;Decreased knowledge of use of DME;Decreased knowledge of precautions PT Treatment Interventions: DME instruction;Gait  training;Stair training;Functional mobility training;Therapeutic activities;Patient/family education   PT Goals Acute Rehab PT Goals PT Goal Formulation: With patient Time For Goal Achievement: 06/21/12 Potential to Achieve Goals: Good Pt will go Supine/Side to Sit: with modified independence PT Goal: Supine/Side to Sit - Progress: Goal set today Pt will go Sit to Supine/Side: with modified independence PT Goal: Sit to Supine/Side - Progress: Goal set today Pt will go Sit to Stand: with modified independence PT Goal: Sit to Stand - Progress: Goal set today Pt will go Stand to Sit: with modified independence PT Goal: Stand to Sit - Progress: Goal set today Pt will Transfer Bed to Chair/Chair to Bed: with modified independence PT Transfer Goal: Bed to Chair/Chair to Bed - Progress: Goal set today Pt will Ambulate: >150 feet;with modified independence;with least restrictive assistive device PT Goal: Ambulate - Progress: Goal set today Pt will Go Up / Down Stairs: 1-2 stairs;with least restrictive assistive device PT Goal: Up/Down Stairs - Progress: Goal set today  Visit Information  Last PT Received On: 06/14/12 Assistance Needed: +1    Subjective Data  Subjective: "I am feeling pretty good." Patient Stated Goal: home   Prior [place] Lives With: Spouse Available Help at Discharge: Family Type of Home: House Home Access: Stairs to enter Technical brewer of Steps: 2 Entrance Stairs-Rails: None Home Layout: One level Home Adaptive Equipment: Straight cane Prior Function Level of Independence: Independent Able to Take Stairs?: Yes Driving: Yes Communication Communication: No difficulties    Cognition  Overall Cognitive Status: Appears within functional limits for tasks assessed/performed Arousal/Alertness: Awake/alert Orientation Level: Oriented X4 / Intact Behavior During Session: [facility] for tasks performed    Extremity/Trunk Assessment     Balance     End of Session PT - End  of Session Equipment Utilized During Treatment: Gait belt Activity Tolerance: Patient tolerated treatment well Patient left: in chair;with call bell/phone within reach;with family/visitor present Nurse Communication: Mobility status;Precautions  GP     Lorriane Shire 06/14/2012, 11:56 AM  Lorrin Goodell, PT  Office # [phone] Pager [phone]

## 2012-06-14 NOTE — Discharge Summary (Signed)
Physician Discharge Summary  Patient ID: Alexander [name] MRN: [id number] DOB/AGE: 05/04/1961 51 y.o.  Admit date: 06/13/2012 Discharge date:  06/14/2012  Admission Diagnoses:  Principal Problem:  *Spinal stenosis, lumbar region, with neurogenic claudication  herniated nucleus pulposus left L4-5 with left and right L5 radiculopathy.  Discharge Diagnoses:  Same  Past Medical History  Diagnosis Date  . Hypertension   . Diabetes mellitus   . Dysrhythmia     h/o pvc's    Surgeries: Procedure(s): MICRODISCECTOMY LUMBAR LAMINECTOMY on 06/13/2012  Central decompressive lumbar laminectomy on 06/13/2012 at the L2-3, L3-4 and L4-5 levels. Consultants:    Discharged Condition: Improved  Hospital Course: Alexander [name] is an 51 y.o. male who was admitted 06/13/2012 with a chief complaint of No chief complaint on file. , and found to have a diagnosis of Spinal stenosis, lumbar region, with neurogenic claudication.  They were brought to the operating room on 06/13/2012 and underwent the above named procedures.    They were given perioperative antibiotics:  Anti-infectives     Start     Dose/Rate Route Frequency Ordered Stop   06/13/12 2000   ceFAZolin (ANCEF) IVPB 1 g/50 mL premix        1 g 100 mL/hr over 30 Minutes Intravenous Every 8 hours 06/13/12 1816 06/14/12 0558   06/12/12 1105   ceFAZolin (ANCEF) 3 g in dextrose 5 % 50 mL IVPB        3 g 160 mL/hr over 30 Minutes Intravenous 60 min pre-op 06/12/12 1105 06/13/12 1343        . Postoperatively the patient was returned to the recovery room and then to the orthopedic suite where he tolerated IV medicines and sparse quantities. He was able to tolerate a regular diet. With physical therapy demonstrates capacity to ambulate weightbearing as tolerated on both lower extremities. On 06/14/2012 was discharged home to ambulate in house for up to one to 2 weeks. Discharge instructions provided. On postoperative day #1 the dressing was changed the  incision dry without your 3 moderate drainage. Drain was removed. Foley catheter was removed and the patient voided spontaneously with no difficulties. Is taking and tolerating by mouth diet with no nausea or vomiting bowel sounds are present on exam. He was advised change dressing on Tuesday, 06/17/2012 may bathe at that time. He is to avoid lifting weights. And 510 pounds avoid bending stooping. He seen back in the office 2 weeks.  They were given sequential compression devices, early ambulation, and chemoprophylaxis for DVT prophylaxis.  They benefited maximally from their hospital stay and there were no complications.    Recent vital signs:  Filed Vitals:   06/14/12 0658  BP: 143/66  Pulse: 60  Temp: 97.6 F (36.4 C)  Resp: 18    Recent laboratory studies:  Results for orders placed during the hospital encounter of 06/13/12  GLUCOSE, CAPILLARY      Component Value Range   Glucose-Capillary 181 (*) 70 - 99 mg/dL  GLUCOSE, CAPILLARY      Component Value Range   Glucose-Capillary 176 (*) 70 - 99 mg/dL   Comment 1 Call MD NNP PA CNM     Comment 2 Documented in Chart    GLUCOSE, CAPILLARY      Component Value Range   Glucose-Capillary 204 (*) 70 - 99 mg/dL   Comment 1 Documented in Chart     Comment 2 Notify RN    GLUCOSE, CAPILLARY      Component Value Range  Glucose-Capillary 258 (*) 70 - 99 mg/dL   Comment 1 Notify RN     Comment 2 Documented in Chart      Discharge Medications:   Medication List  As of 06/14/2012  1:27 PM   TAKE these medications         aspirin EC 81 MG tablet   Take 81 mg by mouth daily.      DSS 100 MG Caps   Take 100 mg by mouth 2 (two) times daily.      ibuprofen 200 MG tablet   Commonly known as: ADVIL,MOTRIN   Take 400 mg by mouth every 6 (six) hours as needed. For pain      lisinopril 20 MG tablet   Commonly known as: PRINIVIL,ZESTRIL   Take 20 mg by mouth daily.      metFORMIN 500 MG tablet   Commonly known as: GLUCOPHAGE   Take  1,000 mg by mouth daily.      methocarbamol 500 MG tablet   Commonly known as: ROBAXIN   Take 1 tablet (500 mg total) by mouth every 6 (six) hours as needed.      metoprolol succinate 50 MG 24 hr tablet   Commonly known as: TOPROL-XL   Take 75 mg by mouth daily. Take with or immediately following a meal.      multivitamins ther. w/minerals Tabs   Take 1 tablet by mouth daily. Men's One a Day      oxyCODONE-acetaminophen 5-325 MG per tablet   Commonly known as: PERCOCET/ROXICET   Take 1-2 tablets by mouth every 6 (six) hours as needed.      senna-docusate 8.6-50 MG per tablet   Commonly known as: Senokot-S   Take 1 tablet by mouth at bedtime as needed for constipation.            Diagnostic Studies: Dg Chest 2 View  06/12/2012  *RADIOLOGY REPORT*  Clinical Data: Cough, hypertension, diabetes, smoker, preop evaluation for lumbar laminectomy  CHEST - 2 VIEW  Comparison:  None.  Findings:  The heart size and mediastinal contours are within normal limits.  Both lungs are clear.  The visualized skeletal structures are unremarkable.  IMPRESSION: No active cardiopulmonary disease.  Original Report Authenticated By: Jerilynn Mages. Daryll Brod, M.D.   Dg Lumbar Spine 2-3 Views  06/13/2012  *RADIOLOGY REPORT*  Clinical Data: L2-L4 laminectomy.  LUMBAR SPINE - 2-3 VIEW  Comparison: 08/08/2011 lumbar spine radiographs.  Findings: Numbering convention used on prior exam preserved. Localization needles project over the inferior aspect of the L2 spinous process and between the L3-L4 spinous processes.  L5-S1 vacuum disc noted.  Subsequent images demonstrate a hemostat on the L3-L4 spinous process superiorly and the inferior hemostat is between the L3 and L4 spinous processes.  IMPRESSION: Intraoperative localization as above.  Original Report Authenticated By: Dereck Ligas, M.D.    Disposition: Final discharge disposition not confirmed  Discharge Orders    Future Orders Please Complete By Expires   Diet -  low sodium heart healthy      Scheduling Instructions:   Low carbohydrate heart healthy diet   Call MD / Call 911      Comments:   If you experience chest pain or shortness of breath, CALL 911 and be transported to the hospital emergency room.  If you develope a fever above 101 F, pus (white drainage) or increased drainage or redness at the wound, or calf pain, call your surgeon's office.   Constipation Prevention  Comments:   Drink plenty of fluids.  Prune juice may be helpful.  You may use a stool softener, such as Colace (over the counter) 100 mg twice a day.  Use MiraLax (over the counter) for constipation as needed.   Increase activity slowly as tolerated      Discharge instructions      Comments:   No lifting greater than 10 lbs. Avoid bending, stooping and twisting. Walk in house for first week them may start to get out slowly increasing distance up to one mile by 3 weeks post op. Keep incision dry for 3 days, may use tegaderm or similar water impervious dressing. May shower or bathe and wet area of the incision Tuesday, 06/17/2012.   Driving restrictions      Comments:   No driving for 6 weeks   Lifting restrictions      Comments:   No lifting for 6 weeks      Follow-up Information    Follow up with NITKA,JAMES E, MD. Schedule an appointment as soon as possible for a visit in 2 weeks.   Contact information:   [facility] [address] [place] [phone]           Signed: Jessy Oto 06/14/2012, 1:27 PM

## 2012-06-14 NOTE — Progress Notes (Signed)
Subjective: 1 Day Post-Op Procedure(s) (LRB): MICRODISCECTOMY LUMBAR LAMINECTOMY (N/A) this patient's awake alert oriented x4 family has been here today with him. He is taking and tolerating by mouth medicines discontinued IV medicines. He is tolerating by mouth diet with standing and ambulating with physical therapy today in the hall. No nausea, no vomiting. Patient reports pain as 4 on 0-10 scale and 5 on 0-10 scale.    Objective: Vital signs in last 24 hours: Temp:  [97.3 F (36.3 C)-98.3 F (36.8 C)] 97.6 F (36.4 C) (08/03 0658) Pulse Rate:  [51-66] 60  (08/03 0658) Resp:  [15-22] 18  (08/03 0658) BP: (108-171)/(41-101) 143/66 mmHg (08/03 0658) SpO2:  [95 %-100 %] 99 % (08/03 0658)  Intake/Output from previous day: 08/02 0701 - 08/03 0700 In: 2260 [P.O.:360; I.V.:1900] Out: 1410 [Urine:1150; Drains:200; Blood:60] Intake/Output this shift: Total I/O In: -  Out: 750 [Urine:750]   Basename 06/12/12 1530  HGB 13.2    Basename 06/12/12 1530  WBC 9.6  RBC 3.94*  HCT 36.2*  PLT 195    Basename 06/12/12 1530  NA 136  K 4.5  CL 100  CO2 28  BUN 18  CREATININE 0.83  GLUCOSE 204*  CALCIUM 9.4   No results found for this basename: LABPT:2,INR:2 in the last 72 hours  ABD soft Neurovascular intact Sensation intact distally Intact pulses distally Dorsiflexion/Plantar flexion intact Incision: no drainage No cellulitis present Dressing change today after removal of Hemovac drain right lower lumbar area. Incision is dry as is the area of drain. Lower extremities do demonstrate weakness in dorsiflexion left foot dorsi flexion is 4+/ 5 right EHL is 4/5.  Assessment/Plan: 1 Day Post-Op Procedure(s) (LRB): MICRODISCECTOMY LUMBAR LAMINECTOMY (N/A) Advance diet Up with therapy D/C IV fluids Discharge home with home health  Armarion Greek E 06/14/2012, 1:16 PM

## 2012-06-16 ENCOUNTER — Encounter (HOSPITAL_COMMUNITY): Payer: Self-pay | Admitting: Specialist

## 2013-03-02 ENCOUNTER — Ambulatory Visit (HOSPITAL_COMMUNITY)
Admission: RE | Admit: 2013-03-02 | Discharge: 2013-03-02 | Disposition: A | Discharge status: Home / Self Care | Payer: Managed Care, Other (non HMO) | Source: Ambulatory Visit | Attending: Family Medicine | Admitting: Family Medicine

## 2013-03-02 ENCOUNTER — Other Ambulatory Visit (HOSPITAL_COMMUNITY): Payer: Self-pay | Admitting: Family Medicine

## 2013-03-02 ENCOUNTER — Ambulatory Visit (HOSPITAL_COMMUNITY)
Admission: AD | Admit: 2013-03-02 | Discharge: 2013-03-02 | Disposition: A | Discharge status: Home / Self Care | Payer: Managed Care, Other (non HMO) | Source: Ambulatory Visit | Attending: Family Medicine | Admitting: Family Medicine

## 2013-03-02 DIAGNOSIS — M79609 Pain in unspecified limb: Secondary | ICD-10-CM | POA: Insufficient documentation

## 2013-03-02 DIAGNOSIS — M19079 Primary osteoarthritis, unspecified ankle and foot: Secondary | ICD-10-CM | POA: Insufficient documentation

## 2013-03-02 DIAGNOSIS — E1169 Type 2 diabetes mellitus with other specified complication: Secondary | ICD-10-CM

## 2013-03-02 DIAGNOSIS — L97509 Non-pressure chronic ulcer of other part of unspecified foot with unspecified severity: Secondary | ICD-10-CM | POA: Insufficient documentation

## 2013-03-02 DIAGNOSIS — M7989 Other specified soft tissue disorders: Secondary | ICD-10-CM | POA: Insufficient documentation

## 2013-03-11 ENCOUNTER — Encounter (HOSPITAL_BASED_OUTPATIENT_CLINIC_OR_DEPARTMENT_OTHER): Payer: Managed Care, Other (non HMO) | Attending: General Surgery

## 2013-03-11 DIAGNOSIS — Z794 Long term (current) use of insulin: Secondary | ICD-10-CM | POA: Insufficient documentation

## 2013-03-11 DIAGNOSIS — E1169 Type 2 diabetes mellitus with other specified complication: Secondary | ICD-10-CM | POA: Insufficient documentation

## 2013-03-11 DIAGNOSIS — I10 Essential (primary) hypertension: Secondary | ICD-10-CM | POA: Insufficient documentation

## 2013-03-11 DIAGNOSIS — L03119 Cellulitis of unspecified part of limb: Secondary | ICD-10-CM | POA: Insufficient documentation

## 2013-03-11 DIAGNOSIS — L02619 Cutaneous abscess of unspecified foot: Secondary | ICD-10-CM | POA: Insufficient documentation

## 2013-03-11 DIAGNOSIS — L97509 Non-pressure chronic ulcer of other part of unspecified foot with unspecified severity: Secondary | ICD-10-CM | POA: Insufficient documentation

## 2013-03-12 NOTE — Progress Notes (Signed)
[facility]  NAME:  Alexander [name], HALFACRE.:  [id number]  MEDICAL RECORD NO.:  [id number]      DATE OF BIRTH:  [birth year]  PHYSICIAN:  Judene Companion, M.D.           VISIT DATE:                                  OFFICE VISIT   He is a 52 year old diabetic gentleman who comes to [place] with an 90-month history of an ulcer on his left plantar aspect over the 5th metatarsal head.  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  He is wearing comfortable shoes and he does not think they cause any pressure ulcer on this left foot.  His doctor put him on amoxicillin.  I looked at it and it does undermine about 2.5 cm going towards the dorsum of the foot.  There is cellulitis.  He takes insulin for his diabetes.  He is also on metformin.  He is also taking metoprolol for hypertension.  He otherwise is healthy.  Today, his blood pressure was 140/80, his pulse was 70, his respirations 16, temperature 98.6.  He weighs 209 pounds.  He has an excellent pulse that is easily palpable, but there is a surrounding cellulitis.  As I mentioned, there is some undermining of this ulcer which is about a centimeter in diameter.  Today, I debrided it and got it down a good bloody tissue and we are treating it with silver alginate and will have him come back here in a week.  So, right now, I call it a diagnosis of diabetic foot ulcer left foot, Wagner 3, history of hypertension, and type 2 diabetes.     Judene Companion, M.D.     PP/MEDQ  D:  03/11/2013  T:  03/12/2013  Job:  [id number]

## 2013-03-17 ENCOUNTER — Ambulatory Visit (HOSPITAL_COMMUNITY)
Admission: RE | Admit: 2013-03-17 | Discharge: 2013-03-17 | Disposition: A | Discharge status: Home / Self Care | Payer: Managed Care, Other (non HMO) | Source: Ambulatory Visit | Attending: General Surgery | Admitting: General Surgery

## 2013-03-17 ENCOUNTER — Other Ambulatory Visit (HOSPITAL_BASED_OUTPATIENT_CLINIC_OR_DEPARTMENT_OTHER): Payer: Self-pay | Admitting: General Surgery

## 2013-03-17 DIAGNOSIS — X58XXXA Exposure to other specified factors, initial encounter: Secondary | ICD-10-CM | POA: Insufficient documentation

## 2013-03-17 DIAGNOSIS — I10 Essential (primary) hypertension: Secondary | ICD-10-CM | POA: Insufficient documentation

## 2013-03-17 DIAGNOSIS — T148XXA Other injury of unspecified body region, initial encounter: Secondary | ICD-10-CM

## 2013-03-17 DIAGNOSIS — E119 Type 2 diabetes mellitus without complications: Secondary | ICD-10-CM | POA: Insufficient documentation

## 2013-03-17 DIAGNOSIS — F17200 Nicotine dependence, unspecified, uncomplicated: Secondary | ICD-10-CM | POA: Insufficient documentation

## 2013-03-18 ENCOUNTER — Other Ambulatory Visit (HOSPITAL_COMMUNITY): Payer: Self-pay | Admitting: General Surgery

## 2013-03-18 ENCOUNTER — Ambulatory Visit (HOSPITAL_COMMUNITY)
Admission: RE | Admit: 2013-03-18 | Discharge: 2013-03-18 | Disposition: A | Discharge status: Home / Self Care | Payer: Managed Care, Other (non HMO) | Source: Ambulatory Visit | Attending: General Surgery | Admitting: General Surgery

## 2013-03-18 ENCOUNTER — Encounter (HOSPITAL_BASED_OUTPATIENT_CLINIC_OR_DEPARTMENT_OTHER): Payer: Managed Care, Other (non HMO) | Attending: General Surgery

## 2013-03-18 DIAGNOSIS — I872 Venous insufficiency (chronic) (peripheral): Secondary | ICD-10-CM

## 2013-03-18 DIAGNOSIS — E1169 Type 2 diabetes mellitus with other specified complication: Secondary | ICD-10-CM | POA: Insufficient documentation

## 2013-03-18 DIAGNOSIS — I509 Heart failure, unspecified: Secondary | ICD-10-CM

## 2013-03-18 DIAGNOSIS — L97509 Non-pressure chronic ulcer of other part of unspecified foot with unspecified severity: Secondary | ICD-10-CM | POA: Insufficient documentation

## 2013-03-18 DIAGNOSIS — I369 Nonrheumatic tricuspid valve disorder, unspecified: Secondary | ICD-10-CM

## 2013-03-18 NOTE — Progress Notes (Signed)
  Echocardiogram 2D Echocardiogram has been performed.  Alexander [name], [place] 03/18/2013, 9:21 AM

## 2013-03-28 NOTE — Progress Notes (Signed)
[facility]  NAME:  Alexander [name], Alexander [name].:  [id number]  MEDICAL RECORD NO.:  [id number]      DATE OF BIRTH:  Feb 26, 1961  PHYSICIAN:  Judene Companion, M.D.           VISIT DATE:                                  OFFICE VISIT   This gentleman is a patient of ours at the [facility].  He is 52 years of age.  He is diabetic.  He is a little overweight at 209 pounds.  He has normal vital signs.  He is afebrile, but he has a diabetic foot ulcer classified as Wagner 3, and it is on the plantar aspect of his left foot.  I have been treating it with debridements and putting on collagen and I really feel that he is going to need hyperbaric oxygen to get this ulcer to heal.  We have not made much progress and I have been seeing him since February with multiple debridements and collagen treatments, so I feel that hyperbaric oxygen would be a good option and then perhaps we could use Apligraf in conjunction with hyperbaric oxygen.     Judene Companion, M.D.     PP/MEDQ  D:  03/27/2013  T:  03/28/2013  Job:  [id number]

## 2013-04-13 ENCOUNTER — Encounter (HOSPITAL_BASED_OUTPATIENT_CLINIC_OR_DEPARTMENT_OTHER): Payer: Managed Care, Other (non HMO) | Attending: General Surgery

## 2013-04-13 DIAGNOSIS — L97509 Non-pressure chronic ulcer of other part of unspecified foot with unspecified severity: Secondary | ICD-10-CM | POA: Insufficient documentation

## 2013-04-13 DIAGNOSIS — E1169 Type 2 diabetes mellitus with other specified complication: Secondary | ICD-10-CM | POA: Insufficient documentation

## 2013-04-13 LAB — GLUCOSE, CAPILLARY: Glucose-Capillary: 145 mg/dL — ABNORMAL HIGH (ref 70–99)

## 2013-04-14 LAB — GLUCOSE, CAPILLARY
Glucose-Capillary: 164 mg/dL — ABNORMAL HIGH (ref 70–99)
Glucose-Capillary: 154 mg/dL — ABNORMAL HIGH (ref 70–99)

## 2013-04-15 ENCOUNTER — Encounter (HOSPITAL_BASED_OUTPATIENT_CLINIC_OR_DEPARTMENT_OTHER): Payer: Managed Care, Other (non HMO)

## 2013-04-15 LAB — GLUCOSE, CAPILLARY
Glucose-Capillary: 123 mg/dL — ABNORMAL HIGH (ref 70–99)
Glucose-Capillary: 133 mg/dL — ABNORMAL HIGH (ref 70–99)

## 2013-04-16 LAB — GLUCOSE, CAPILLARY: Glucose-Capillary: 165 mg/dL — ABNORMAL HIGH (ref 70–99)

## 2013-04-16 NOTE — Progress Notes (Signed)
[facility]  NAME:  Alexander [name], GWYNN.:  [id number]  MEDICAL RECORD NO.:  [id number]      DATE OF BIRTH:  Jul 21, 1961  PHYSICIAN:  Theodoro Kos, DO       VISIT DATE:  04/15/2013                                  OFFICE VISIT   The patient is a 52 year old gentleman who is here for followup on his diabetic foot ulcer, Wagner III.  He has been getting silver alginate and hyperbaric treatment.  He states that he feels like it is getting a little bit better.  His ulcer is located on the plantar aspect of his left foot.  PAST MEDICAL HISTORY:  Positive for vascular disease, hypertension, diabetes, heart disease, back pain.  PAST SURGICAL HISTORY:  He has had lumbar surgery before.  MEDICATIONS:  Lisinopril, glyburide, metoprolol, and Janumet, multivitamin and aspirin.  ALLERGIES:  He is allergic to Bactrim.  SOCIAL HISTORY:  He is a smoker and does use alcohol.  REVIEW OF SYSTEMS:  Otherwise negative.  PHYSICAL EXAMINATION:  On exam, he is pleasant, alert, oriented, cooperative, tolerating his p.o. well.  His pupils were equal.  His breathing is unlabored and his heart is regular.  The wound was debrided on the foot and on the lateral aspect down to and including tendon.  He has a little bit of tunneling as well.  It did have good blood flow as far as bleeding edges with the debridement.  He very well may need OR debridement because he is sensitive to even moderate touch, and I discussed this with him and he is going to think about it.  So, in the interim, we will continue with the silver alginate, the hyperbaric treatment and have him follow up in a week.  I do think that the formal debridement would be helpful while he is getting hyperbaric treatment.     Theodoro Kos, DO     CS/MEDQ  D:  04/15/2013  T:  04/15/2013  Job:  [phone] [id number]

## 2013-04-21 LAB — GLUCOSE, CAPILLARY
Glucose-Capillary: 196 mg/dL — ABNORMAL HIGH (ref 70–99)
Glucose-Capillary: 134 mg/dL — ABNORMAL HIGH (ref 70–99)

## 2013-04-22 LAB — GLUCOSE, CAPILLARY
Glucose-Capillary: 173 mg/dL — ABNORMAL HIGH (ref 70–99)
Glucose-Capillary: 140 mg/dL — ABNORMAL HIGH (ref 70–99)

## 2013-04-23 LAB — GLUCOSE, CAPILLARY
Glucose-Capillary: 194 mg/dL — ABNORMAL HIGH (ref 70–99)
Glucose-Capillary: 193 mg/dL — ABNORMAL HIGH (ref 70–99)

## 2013-04-27 LAB — GLUCOSE, CAPILLARY: Glucose-Capillary: 157 mg/dL — ABNORMAL HIGH (ref 70–99)

## 2013-04-28 LAB — GLUCOSE, CAPILLARY: Glucose-Capillary: 138 mg/dL — ABNORMAL HIGH (ref 70–99)

## 2013-04-29 ENCOUNTER — Other Ambulatory Visit (HOSPITAL_COMMUNITY): Payer: Self-pay | Admitting: General Surgery

## 2013-04-29 DIAGNOSIS — I739 Peripheral vascular disease, unspecified: Secondary | ICD-10-CM

## 2013-04-29 LAB — GLUCOSE, CAPILLARY
Glucose-Capillary: 172 mg/dL — ABNORMAL HIGH (ref 70–99)
Glucose-Capillary: 124 mg/dL — ABNORMAL HIGH (ref 70–99)

## 2013-04-30 LAB — GLUCOSE, CAPILLARY: Glucose-Capillary: 141 mg/dL — ABNORMAL HIGH (ref 70–99)

## 2013-05-04 LAB — GLUCOSE, CAPILLARY: Glucose-Capillary: 152 mg/dL — ABNORMAL HIGH (ref 70–99)

## 2013-05-06 ENCOUNTER — Ambulatory Visit (HOSPITAL_COMMUNITY)
Admission: RE | Admit: 2013-05-06 | Discharge: 2013-05-06 | Disposition: A | Discharge status: Home / Self Care | Payer: Managed Care, Other (non HMO) | Source: Ambulatory Visit | Attending: General Surgery | Admitting: General Surgery

## 2013-05-06 DIAGNOSIS — I739 Peripheral vascular disease, unspecified: Secondary | ICD-10-CM | POA: Insufficient documentation

## 2013-05-06 DIAGNOSIS — L97509 Non-pressure chronic ulcer of other part of unspecified foot with unspecified severity: Secondary | ICD-10-CM

## 2013-05-06 NOTE — Progress Notes (Signed)
Arterial Lower Ext. Duplex Completed. Oda Cogan, RDMS, RVT

## 2013-05-07 LAB — GLUCOSE, CAPILLARY
Glucose-Capillary: 171 mg/dL — ABNORMAL HIGH (ref 70–99)
Glucose-Capillary: 281 mg/dL — ABNORMAL HIGH (ref 70–99)
Glucose-Capillary: 183 mg/dL — ABNORMAL HIGH (ref 70–99)

## 2013-05-08 LAB — GLUCOSE, CAPILLARY
Glucose-Capillary: 194 mg/dL — ABNORMAL HIGH (ref 70–99)
Glucose-Capillary: 173 mg/dL — ABNORMAL HIGH (ref 70–99)

## 2013-05-12 ENCOUNTER — Encounter (HOSPITAL_BASED_OUTPATIENT_CLINIC_OR_DEPARTMENT_OTHER): Payer: Managed Care, Other (non HMO) | Attending: General Surgery

## 2013-05-12 DIAGNOSIS — E1169 Type 2 diabetes mellitus with other specified complication: Secondary | ICD-10-CM | POA: Insufficient documentation

## 2013-05-12 DIAGNOSIS — L97509 Non-pressure chronic ulcer of other part of unspecified foot with unspecified severity: Secondary | ICD-10-CM | POA: Insufficient documentation

## 2013-05-12 LAB — GLUCOSE, CAPILLARY: Glucose-Capillary: 133 mg/dL — ABNORMAL HIGH (ref 70–99)

## 2013-05-13 LAB — GLUCOSE, CAPILLARY: Glucose-Capillary: 177 mg/dL — ABNORMAL HIGH (ref 70–99)

## 2013-05-18 LAB — GLUCOSE, CAPILLARY: Glucose-Capillary: 173 mg/dL — ABNORMAL HIGH (ref 70–99)

## 2013-05-19 LAB — GLUCOSE, CAPILLARY
Glucose-Capillary: 174 mg/dL — ABNORMAL HIGH (ref 70–99)
Glucose-Capillary: 141 mg/dL — ABNORMAL HIGH (ref 70–99)

## 2013-05-20 LAB — GLUCOSE, CAPILLARY: Glucose-Capillary: 151 mg/dL — ABNORMAL HIGH (ref 70–99)

## 2013-05-21 LAB — GLUCOSE, CAPILLARY
Glucose-Capillary: 137 mg/dL — ABNORMAL HIGH (ref 70–99)
Glucose-Capillary: 122 mg/dL — ABNORMAL HIGH (ref 70–99)

## 2013-05-22 LAB — GLUCOSE, CAPILLARY: Glucose-Capillary: 136 mg/dL — ABNORMAL HIGH (ref 70–99)

## 2013-05-23 NOTE — Progress Notes (Signed)
[facility]  NAME:  Alexander [name], Alexander [name].:  [id number]  MEDICAL RECORD NO.:  [id number]      DATE OF BIRTH:  [birth year]  PHYSICIAN:  Judene Companion, M.D.           VISIT DATE:                                  OFFICE VISIT   [name] is a 52 year old diabetic gentleman who I have been taking care of for about 6 weeks for a Wagner 3 ulcer on the plantar aspect of his left foot.  We have treated this with antibiotics and debridements and collagen and we have also given him a course of hyperbaric oxygen therapy.  I feel that we have made great strides to improve this ulcer, it is smaller, it is about 50% of what it was to begin his treatment at the [facility].  It is now about 2 cm x 1 cm, and I would like to have permission to give him another course of hyperbaric oxygen along with Apligraf treatments to heal this diabetic ulcer up.     Judene Companion, M.D.     PP/MEDQ  D:  05/22/2013  T:  05/23/2013  Job:  [id number]

## 2013-05-28 NOTE — Progress Notes (Signed)
[facility]  NAME:  Alexander [name], Alexander [name].:  [id number]  MEDICAL RECORD NO.:  [id number]      DATE OF BIRTH:  Oct 22, 1961  PHYSICIAN:  Judene Companion, M.D.           VISIT DATE:                                  OFFICE VISIT   Note to the [employer]  This is a 52 year old diabetic gentleman who we have been treating for several months, for a diabetic foot ulcer on the left plantar aspect over the 5th metatarsal head.  He has been treated with hyperbaric oxygen.  We have also treated it with silver alginate, and we have most recently treated it with silver collagen, and we have done multiple debridements.  It is classified as a Wagner 3 diabetic foot ulcer.  We have made great strides in healing this ulcer with the different dressings and the hyperbaric oxygen treatments.  He is responding well and to make sure of saving his limb, I would like to give him another 30 days of hyperbaric oxygen along with the collagen treatments, and we have also applied for  dermagraft     Judene Companion, M.D.     PP/MEDQ  D:  05/27/2013  T:  05/28/2013  Job:  [id number]

## 2013-06-01 LAB — GLUCOSE, CAPILLARY: Glucose-Capillary: 236 mg/dL — ABNORMAL HIGH (ref 70–99)

## 2013-06-02 LAB — GLUCOSE, CAPILLARY: Glucose-Capillary: 191 mg/dL — ABNORMAL HIGH (ref 70–99)

## 2013-06-04 LAB — GLUCOSE, CAPILLARY
Glucose-Capillary: 177 mg/dL — ABNORMAL HIGH (ref 70–99)
Glucose-Capillary: 122 mg/dL — ABNORMAL HIGH (ref 70–99)

## 2013-06-08 LAB — GLUCOSE, CAPILLARY: Glucose-Capillary: 156 mg/dL — ABNORMAL HIGH (ref 70–99)

## 2013-06-10 LAB — GLUCOSE, CAPILLARY: Glucose-Capillary: 146 mg/dL — ABNORMAL HIGH (ref 70–99)

## 2013-06-12 ENCOUNTER — Encounter (HOSPITAL_BASED_OUTPATIENT_CLINIC_OR_DEPARTMENT_OTHER): Payer: Managed Care, Other (non HMO) | Attending: General Surgery

## 2013-06-12 DIAGNOSIS — L97509 Non-pressure chronic ulcer of other part of unspecified foot with unspecified severity: Secondary | ICD-10-CM | POA: Insufficient documentation

## 2013-06-12 DIAGNOSIS — E1169 Type 2 diabetes mellitus with other specified complication: Secondary | ICD-10-CM | POA: Insufficient documentation

## 2013-06-16 LAB — GLUCOSE, CAPILLARY
Glucose-Capillary: 132 mg/dL — ABNORMAL HIGH (ref 70–99)
Glucose-Capillary: 133 mg/dL — ABNORMAL HIGH (ref 70–99)

## 2013-06-18 LAB — GLUCOSE, CAPILLARY
Glucose-Capillary: 162 mg/dL — ABNORMAL HIGH (ref 70–99)
Glucose-Capillary: 189 mg/dL — ABNORMAL HIGH (ref 70–99)
Glucose-Capillary: 165 mg/dL — ABNORMAL HIGH (ref 70–99)

## 2013-06-19 LAB — GLUCOSE, CAPILLARY
Glucose-Capillary: 231 mg/dL — ABNORMAL HIGH (ref 70–99)
Glucose-Capillary: 155 mg/dL — ABNORMAL HIGH (ref 70–99)

## 2013-06-22 LAB — GLUCOSE, CAPILLARY: Glucose-Capillary: 227 mg/dL — ABNORMAL HIGH (ref 70–99)

## 2013-06-24 LAB — GLUCOSE, CAPILLARY: Glucose-Capillary: 155 mg/dL — ABNORMAL HIGH (ref 70–99)

## 2013-06-26 LAB — GLUCOSE, CAPILLARY
Glucose-Capillary: 167 mg/dL — ABNORMAL HIGH (ref 70–99)
Glucose-Capillary: 133 mg/dL — ABNORMAL HIGH (ref 70–99)

## 2013-06-29 LAB — GLUCOSE, CAPILLARY: Glucose-Capillary: 198 mg/dL — ABNORMAL HIGH (ref 70–99)

## 2013-06-30 LAB — GLUCOSE, CAPILLARY: Glucose-Capillary: 150 mg/dL — ABNORMAL HIGH (ref 70–99)

## 2013-07-02 LAB — GLUCOSE, CAPILLARY: Glucose-Capillary: 173 mg/dL — ABNORMAL HIGH (ref 70–99)

## 2013-07-03 LAB — GLUCOSE, CAPILLARY: Glucose-Capillary: 127 mg/dL — ABNORMAL HIGH (ref 70–99)

## 2013-07-07 LAB — GLUCOSE, CAPILLARY
Glucose-Capillary: 179 mg/dL — ABNORMAL HIGH (ref 70–99)
Glucose-Capillary: 199 mg/dL — ABNORMAL HIGH (ref 70–99)
Glucose-Capillary: 198 mg/dL — ABNORMAL HIGH (ref 70–99)

## 2013-07-08 LAB — GLUCOSE, CAPILLARY: Glucose-Capillary: 130 mg/dL — ABNORMAL HIGH (ref 70–99)

## 2013-07-10 LAB — GLUCOSE, CAPILLARY
Glucose-Capillary: 151 mg/dL — ABNORMAL HIGH (ref 70–99)
Glucose-Capillary: 120 mg/dL — ABNORMAL HIGH (ref 70–99)

## 2013-07-14 ENCOUNTER — Encounter (HOSPITAL_BASED_OUTPATIENT_CLINIC_OR_DEPARTMENT_OTHER): Payer: Managed Care, Other (non HMO) | Attending: General Surgery

## 2013-07-14 DIAGNOSIS — L97509 Non-pressure chronic ulcer of other part of unspecified foot with unspecified severity: Secondary | ICD-10-CM | POA: Insufficient documentation

## 2013-07-14 DIAGNOSIS — E1169 Type 2 diabetes mellitus with other specified complication: Secondary | ICD-10-CM | POA: Insufficient documentation

## 2013-07-22 ENCOUNTER — Other Ambulatory Visit (HOSPITAL_BASED_OUTPATIENT_CLINIC_OR_DEPARTMENT_OTHER): Payer: Self-pay | Admitting: General Surgery

## 2013-07-22 ENCOUNTER — Ambulatory Visit (HOSPITAL_COMMUNITY)
Admission: RE | Admit: 2013-07-22 | Discharge: 2013-07-22 | Disposition: A | Discharge status: Home / Self Care | Payer: Managed Care, Other (non HMO) | Source: Ambulatory Visit | Attending: General Surgery | Admitting: General Surgery

## 2013-07-22 DIAGNOSIS — M869 Osteomyelitis, unspecified: Secondary | ICD-10-CM

## 2013-07-22 DIAGNOSIS — X58XXXA Exposure to other specified factors, initial encounter: Secondary | ICD-10-CM | POA: Insufficient documentation

## 2013-07-22 DIAGNOSIS — S8990XA Unspecified injury of unspecified lower leg, initial encounter: Secondary | ICD-10-CM | POA: Insufficient documentation

## 2013-07-22 DIAGNOSIS — S99919A Unspecified injury of unspecified ankle, initial encounter: Secondary | ICD-10-CM | POA: Insufficient documentation

## 2013-07-22 DIAGNOSIS — M86179 Other acute osteomyelitis, unspecified ankle and foot: Secondary | ICD-10-CM | POA: Insufficient documentation

## 2013-07-22 LAB — GLUCOSE, CAPILLARY: Glucose-Capillary: 142 mg/dL — ABNORMAL HIGH (ref 70–99)

## 2013-08-12 ENCOUNTER — Encounter (HOSPITAL_BASED_OUTPATIENT_CLINIC_OR_DEPARTMENT_OTHER): Payer: Managed Care, Other (non HMO) | Attending: General Surgery

## 2013-08-12 DIAGNOSIS — E1169 Type 2 diabetes mellitus with other specified complication: Secondary | ICD-10-CM | POA: Insufficient documentation

## 2013-08-12 DIAGNOSIS — L97509 Non-pressure chronic ulcer of other part of unspecified foot with unspecified severity: Secondary | ICD-10-CM | POA: Insufficient documentation

## 2013-08-14 ENCOUNTER — Encounter (HOSPITAL_COMMUNITY): Payer: Self-pay

## 2013-08-17 ENCOUNTER — Encounter (HOSPITAL_COMMUNITY)
Admission: RE | Admit: 2013-08-17 | Discharge: 2013-08-17 | Disposition: A | Discharge status: Home / Self Care | Payer: Managed Care, Other (non HMO) | Source: Ambulatory Visit | Attending: Anesthesiology | Admitting: Anesthesiology

## 2013-08-17 ENCOUNTER — Encounter (HOSPITAL_COMMUNITY): Payer: Self-pay

## 2013-08-17 ENCOUNTER — Other Ambulatory Visit: Payer: Self-pay | Admitting: Orthopedic Surgery

## 2013-08-17 ENCOUNTER — Encounter (HOSPITAL_COMMUNITY)
Admission: RE | Admit: 2013-08-17 | Discharge: 2013-08-17 | Disposition: A | Discharge status: Home / Self Care | Payer: Managed Care, Other (non HMO) | Source: Ambulatory Visit | Attending: Orthopedic Surgery | Admitting: Orthopedic Surgery

## 2013-08-17 HISTORY — DX: Unspecified osteoarthritis, unspecified site: M19.90

## 2013-08-17 LAB — COMPREHENSIVE METABOLIC PANEL
ALT: 37 U/L (ref 0–53)
AST: 23 U/L (ref 0–37)
Alkaline Phosphatase: 73 U/L (ref 39–117)
CO2: 26 mEq/L (ref 19–32)
Calcium: 9.2 mg/dL (ref 8.4–10.5)
Chloride: 95 mEq/L — ABNORMAL LOW (ref 96–112)
GFR calc non Af Amer: >90 mL/min (ref >90)
Potassium: 4.8 mEq/L (ref 3.5–5.1)
Sodium: 130 mEq/L — ABNORMAL LOW (ref 135–145)
Total Bilirubin: 0.4 mg/dL (ref 0.3–1.2)

## 2013-08-17 LAB — CBC
HCT: 41.1 % (ref 39.0–52.0)
MCH: 32.6 pg (ref 26.0–34.0)
Platelets: 241 10*3/uL (ref 150–400)
RBC: 4.57 MIL/uL (ref 4.22–5.81)
WBC: 7.7 10*3/uL (ref 4.0–10.5)

## 2013-08-17 NOTE — Progress Notes (Signed)
Pt states he's had hyperbaric treatments (60 treatments, 5 days a week) and the doctors had told him that his vision would be blurry--it is now.   He's had a stress test with Dr. Tressia Miners Turner--i have requested those records.  Echo is in EPIC.Marland KitchenDA

## 2013-08-17 NOTE — Pre-Procedure Instructions (Signed)
ANTERIO DEMLOW  08/17/2013   Your procedure is scheduled on: Wednesday, October 8th   Report to [facility]  2 * 3 at 9:30 AM.             Dennis Bast will come in through Entrance "A" off of [address]---[employer])   Call this number if you have problems the morning of surgery: [phone]   Remember:   Do not eat food or drink liquids after midnight Tuesday.   Take these medicines the morning of surgery with A SIP OF WATER: Metoprolol   Do not wear jewelry.  Do not wear lotions, powders, or colognes. You may NOT wear deodorant.   Men may shave face and neck.   Do not bring valuables to the hospital.  [facility] is not responsible for any belongings or valuables.               Contacts, dentures or bridgework may not be worn into surgery.  Leave suitcase in the car. After surgery it may be brought to your room.  For patients admitted to the hospital, discharge time is determined by your treatment team.               Patients discharged the day of surgery will not be allowed to drive home and will need someone to stay with you for the first 24 hrs.   Name and phone number of your driver:    Special Instructions: Shower using CHG 2 nights before surgery and the night before surgery.  If you shower the day of surgery use CHG.  Use special wash - you have one bottle of CHG for all showers.  You should use approximately 1/3 of the bottle for each shower.   Please read over the following fact sheets that you were given: Pain Booklet, Coughing and Deep Breathing and Surgical Site Infection Prevention

## 2013-08-18 MED ORDER — POVIDONE-IODINE 7.5 % EX SOLN
Freq: Once | CUTANEOUS | Status: DC
  Filled 2013-08-18: qty 118

## 2013-08-18 NOTE — Progress Notes (Signed)
Re-requested stress test from Dr. Theodosia Blender office [phone])

## 2013-08-19 ENCOUNTER — Encounter (HOSPITAL_COMMUNITY)
Admission: RE | Disposition: A | Discharge status: Home / Self Care | Payer: Self-pay | Source: Ambulatory Visit | Attending: Orthopedic Surgery

## 2013-08-19 ENCOUNTER — Encounter (HOSPITAL_COMMUNITY): Payer: Managed Care, Other (non HMO) | Admitting: Vascular Surgery

## 2013-08-19 ENCOUNTER — Ambulatory Visit (HOSPITAL_COMMUNITY): Payer: Managed Care, Other (non HMO) | Admitting: Anesthesiology

## 2013-08-19 ENCOUNTER — Encounter (HOSPITAL_COMMUNITY): Payer: Self-pay | Admitting: *Deleted

## 2013-08-19 ENCOUNTER — Ambulatory Visit (HOSPITAL_COMMUNITY)
Admission: RE | Admit: 2013-08-19 | Discharge: 2013-08-19 | Disposition: A | Discharge status: Home / Self Care | Payer: Managed Care, Other (non HMO) | Source: Ambulatory Visit | Attending: Orthopedic Surgery | Admitting: Orthopedic Surgery

## 2013-08-19 DIAGNOSIS — M869 Osteomyelitis, unspecified: Secondary | ICD-10-CM | POA: Diagnosis present

## 2013-08-19 DIAGNOSIS — L03039 Cellulitis of unspecified toe: Secondary | ICD-10-CM | POA: Insufficient documentation

## 2013-08-19 DIAGNOSIS — I10 Essential (primary) hypertension: Secondary | ICD-10-CM | POA: Insufficient documentation

## 2013-08-19 DIAGNOSIS — E1169 Type 2 diabetes mellitus with other specified complication: Secondary | ICD-10-CM | POA: Insufficient documentation

## 2013-08-19 DIAGNOSIS — G8929 Other chronic pain: Secondary | ICD-10-CM | POA: Insufficient documentation

## 2013-08-19 DIAGNOSIS — Z01818 Encounter for other preprocedural examination: Secondary | ICD-10-CM | POA: Insufficient documentation

## 2013-08-19 DIAGNOSIS — M549 Dorsalgia, unspecified: Secondary | ICD-10-CM | POA: Insufficient documentation

## 2013-08-19 DIAGNOSIS — E669 Obesity, unspecified: Secondary | ICD-10-CM | POA: Insufficient documentation

## 2013-08-19 DIAGNOSIS — Z01810 Encounter for preprocedural cardiovascular examination: Secondary | ICD-10-CM | POA: Insufficient documentation

## 2013-08-19 DIAGNOSIS — E1149 Type 2 diabetes mellitus with other diabetic neurological complication: Secondary | ICD-10-CM | POA: Insufficient documentation

## 2013-08-19 DIAGNOSIS — L02619 Cutaneous abscess of unspecified foot: Secondary | ICD-10-CM | POA: Insufficient documentation

## 2013-08-19 DIAGNOSIS — Z01812 Encounter for preprocedural laboratory examination: Secondary | ICD-10-CM | POA: Insufficient documentation

## 2013-08-19 DIAGNOSIS — M86679 Other chronic osteomyelitis, unspecified ankle and foot: Secondary | ICD-10-CM | POA: Insufficient documentation

## 2013-08-19 DIAGNOSIS — F17200 Nicotine dependence, unspecified, uncomplicated: Secondary | ICD-10-CM | POA: Insufficient documentation

## 2013-08-19 DIAGNOSIS — G610 Guillain-Barre syndrome: Secondary | ICD-10-CM | POA: Insufficient documentation

## 2013-08-19 DIAGNOSIS — M9080 Osteopathy in diseases classified elsewhere, unspecified site: Secondary | ICD-10-CM | POA: Insufficient documentation

## 2013-08-19 HISTORY — DX: Osteomyelitis, unspecified: M86.9

## 2013-08-19 HISTORY — PX: AMPUTATION: SHX166

## 2013-08-19 LAB — SURGICAL PCR SCREEN: Staphylococcus aureus: NEGATIVE

## 2013-08-19 LAB — GLUCOSE, CAPILLARY: Glucose-Capillary: 145 mg/dL — ABNORMAL HIGH (ref 70–99)

## 2013-08-19 SURGERY — AMPUTATION, FOOT, RAY
Anesthesia: Monitor Anesthesia Care | Site: Foot | Laterality: Left | Wound class: Dirty or Infected

## 2013-08-19 MED ORDER — PROPOFOL INFUSION 10 MG/ML OPTIME
INTRAVENOUS | Status: DC | PRN
  Administered 2013-08-19: 100 ug/kg/min via INTRAVENOUS

## 2013-08-19 MED ORDER — FENTANYL CITRATE 0.05 MG/ML IJ SOLN
INTRAMUSCULAR | Status: DC | PRN
  Administered 2013-08-19: 100 ug via INTRAVENOUS
  Administered 2013-08-19: 50 ug via INTRAVENOUS

## 2013-08-19 MED ORDER — LACTATED RINGERS IV SOLN
INTRAVENOUS | Status: DC | PRN
  Administered 2013-08-19: 11:00:00 via INTRAVENOUS

## 2013-08-19 MED ORDER — BUPIVACAINE HCL (PF) 0.5 % IJ SOLN
INTRAMUSCULAR | Status: DC | PRN
  Administered 2013-08-19: 40 mL

## 2013-08-19 MED ORDER — HYDROMORPHONE HCL PF 1 MG/ML IJ SOLN: 0.2500 mg | INTRAMUSCULAR | Status: DC | PRN

## 2013-08-19 MED ORDER — DOXYCYCLINE HYCLATE 50 MG PO CAPS: 100.0000 mg | ORAL_CAPSULE | Freq: Two times a day (BID) | ORAL | Status: DC

## 2013-08-19 MED ORDER — MIDAZOLAM HCL 5 MG/5ML IJ SOLN
INTRAMUSCULAR | Status: DC | PRN
  Administered 2013-08-19: 2 mg via INTRAVENOUS

## 2013-08-19 MED ORDER — PHENYLEPHRINE HCL 10 MG/ML IJ SOLN
INTRAMUSCULAR | Status: DC | PRN
  Administered 2013-08-19: 40 ug via INTRAVENOUS

## 2013-08-19 MED ORDER — MEPERIDINE HCL 25 MG/ML IJ SOLN: 6.2500 mg | INTRAMUSCULAR | Status: DC | PRN

## 2013-08-19 MED ORDER — VANCOMYCIN HCL 1000 MG IV SOLR
1000.0000 mg | INTRAVENOUS | Status: DC | PRN
  Administered 2013-08-19: 1000 mg via INTRAVENOUS

## 2013-08-19 MED ORDER — CEFAZOLIN SODIUM-DEXTROSE 2-3 GM-% IV SOLR
INTRAVENOUS | Status: AC
  Administered 2013-08-19: 2 g via INTRAVENOUS
  Filled 2013-08-19: qty 50

## 2013-08-19 MED ORDER — OXYCODONE HCL 5 MG/5ML PO SOLN: 5.0000 mg | Freq: Once | ORAL | Status: DC | PRN

## 2013-08-19 MED ORDER — VANCOMYCIN HCL IN DEXTROSE 1-5 GM/200ML-% IV SOLN
INTRAVENOUS | Status: AC
  Filled 2013-08-19: qty 200

## 2013-08-19 MED ORDER — PROMETHAZINE HCL 25 MG/ML IJ SOLN: 6.2500 mg | INTRAMUSCULAR | Status: DC | PRN

## 2013-08-19 MED ORDER — HYDROCODONE-ACETAMINOPHEN 5-325 MG PO TABS: 1.0000 | ORAL_TABLET | Freq: Four times a day (QID) | ORAL | Status: DC | PRN

## 2013-08-19 MED ORDER — OXYCODONE HCL 5 MG PO TABS: 5.0000 mg | ORAL_TABLET | Freq: Once | ORAL | Status: DC | PRN

## 2013-08-19 MED ORDER — LIDOCAINE HCL (CARDIAC) 20 MG/ML IV SOLN
INTRAVENOUS | Status: DC | PRN
  Administered 2013-08-19: 60 mg via INTRAVENOUS

## 2013-08-19 MED ORDER — MIDAZOLAM HCL 2 MG/2ML IJ SOLN: 0.5000 mg | Freq: Once | INTRAMUSCULAR | Status: DC | PRN

## 2013-08-19 MED ORDER — MUPIROCIN 2 % EX OINT
TOPICAL_OINTMENT | Freq: Two times a day (BID) | CUTANEOUS | Status: DC
  Administered 2013-08-19: 1 via NASAL
  Filled 2013-08-19: qty 22

## 2013-08-19 SURGICAL SUPPLY — 43 items
BANDAGE CONFORM 2  STR LF (GAUZE/BANDAGES/DRESSINGS) IMPLANT
BANDAGE ELASTIC 3 VELCRO ST LF (GAUZE/BANDAGES/DRESSINGS) ×2 IMPLANT
BANDAGE ELASTIC 4 VELCRO ST LF (GAUZE/BANDAGES/DRESSINGS) ×2 IMPLANT
BANDAGE GAUZE ELAST BULKY 4 IN (GAUZE/BANDAGES/DRESSINGS) ×2 IMPLANT
BLADE OSCILLATING /SAGITTAL (BLADE) ×2 IMPLANT
BNDG COHESIVE 1X5 TAN STRL LF (GAUZE/BANDAGES/DRESSINGS) IMPLANT
BNDG ESMARK 4X9 LF (GAUZE/BANDAGES/DRESSINGS) ×2 IMPLANT
CLOTH BEACON ORANGE TIMEOUT ST (SAFETY) ×2 IMPLANT
COVER SURGICAL LIGHT HANDLE (MISCELLANEOUS) ×2 IMPLANT
CUFF TOURNIQUET SINGLE 18IN (TOURNIQUET CUFF) IMPLANT
CUFF TOURNIQUET SINGLE 24IN (TOURNIQUET CUFF) IMPLANT
CUFF TOURNIQUET SINGLE 34IN LL (TOURNIQUET CUFF) ×2 IMPLANT
CUFF TOURNIQUET SINGLE 44IN (TOURNIQUET CUFF) IMPLANT
DRAPE U-SHAPE 47X51 STRL (DRAPES) ×2 IMPLANT
DRSG ADAPTIC 3X8 NADH LF (GAUZE/BANDAGES/DRESSINGS) ×2 IMPLANT
GAUZE SPONGE 2X2 8PLY STRL LF (GAUZE/BANDAGES/DRESSINGS) IMPLANT
GLOVE BIOGEL PI IND STRL 8 (GLOVE) ×2 IMPLANT
GLOVE BIOGEL PI INDICATOR 8 (GLOVE) ×2
GLOVE ECLIPSE 7.5 STRL STRAW (GLOVE) ×4 IMPLANT
GOWN PREVENTION PLUS XLARGE (GOWN DISPOSABLE) ×2 IMPLANT
GOWN SRG XL XLNG 56XLVL 4 (GOWN DISPOSABLE) ×1 IMPLANT
GOWN STRL NON-REIN LRG LVL3 (GOWN DISPOSABLE) ×4 IMPLANT
GOWN STRL NON-REIN XL XLG LVL4 (GOWN DISPOSABLE) ×1
KIT BASIN OR (CUSTOM PROCEDURE TRAY) ×2 IMPLANT
KIT ROOM TURNOVER OR (KITS) ×2 IMPLANT
MANIFOLD NEPTUNE II (INSTRUMENTS) ×2 IMPLANT
NS IRRIG 1000ML POUR BTL (IV SOLUTION) ×2 IMPLANT
PACK ORTHO EXTREMITY (CUSTOM PROCEDURE TRAY) ×2 IMPLANT
PAD ARMBOARD 7.5X6 YLW CONV (MISCELLANEOUS) ×4 IMPLANT
PAD CAST 4YDX4 CTTN HI CHSV (CAST SUPPLIES) IMPLANT
PADDING CAST COTTON 4X4 STRL (CAST SUPPLIES)
SOLUTION BETADINE 4OZ (MISCELLANEOUS) ×2 IMPLANT
SPECIMEN JAR SMALL (MISCELLANEOUS) ×2 IMPLANT
SPONGE GAUZE 2X2 STER 10/PKG (GAUZE/BANDAGES/DRESSINGS)
SPONGE GAUZE 4X4 12PLY (GAUZE/BANDAGES/DRESSINGS) ×2 IMPLANT
SPONGE SCRUB IODOPHOR (GAUZE/BANDAGES/DRESSINGS) ×2 IMPLANT
SUCTION FRAZIER TIP 10 FR DISP (SUCTIONS) IMPLANT
SUT ETHILON 4 0 PS 2 18 (SUTURE) ×2 IMPLANT
TOWEL OR 17X24 6PK STRL BLUE (TOWEL DISPOSABLE) ×2 IMPLANT
TOWEL OR 17X26 10 PK STRL BLUE (TOWEL DISPOSABLE) ×2 IMPLANT
TUBE CONNECTING 12X1/4 (SUCTIONS) ×2 IMPLANT
UNDERPAD 30X30 INCONTINENT (UNDERPADS AND DIAPERS) ×2 IMPLANT
WATER STERILE IRR 1000ML POUR (IV SOLUTION) ×2 IMPLANT

## 2013-08-19 NOTE — Progress Notes (Signed)
Orthopedic Tech Progress Note Patient Details:  Alexander [name] September 17, 1961 [id number]  Ortho Devices Type of Ortho Device: Postop shoe/boot Ortho Device/Splint Interventions: Application   [name] 08/19/2013, 1:02 PM

## 2013-08-19 NOTE — Anesthesia Postprocedure Evaluation (Signed)
  Anesthesia Post-op Note  Patient: Alexander [name]  Procedure(s) Performed: Procedure(s) with comments: AMPUTATION RAY (Left) - ray amputation left 5th  Patient Location: PACU  Anesthesia Type:MAC combined with regional for post-op pain  Level of Consciousness: awake, alert , oriented and patient cooperative  Airway and Oxygen Therapy: Patient Spontanous Breathing  Post-op Pain: none  Post-op Assessment: Post-op Vital signs reviewed, Patient's Cardiovascular Status Stable, Respiratory Function Stable, Patent Airway, No signs of Nausea or vomiting, Adequate PO intake and Pain level controlled  Post-op Vital Signs: Reviewed and stable  Complications: No apparent anesthesia complications

## 2013-08-19 NOTE — H&P (Signed)
PREOPERATIVE H&P  Chief Complaint: l foot osteo and cellulitis l 5th toe and metatarsal  HPI: Alexander [name] is a 52 y.o. male who presents for evaluation of l foot infection. It has been present for 3 months and has been worsening. He has failed conservative measures. Pain is rated as mild.  Past Medical History  Diagnosis Date  . Hypertension   . Dysrhythmia     h/o pvc's  . Arthritis   . Diabetes mellitus     dx---been  awhile   Past Surgical History  Procedure Laterality Date  . Lumbar laminectomy  06/13/2012    Procedure: MICRODISCECTOMY LUMBAR LAMINECTOMY;  Surgeon: Jessy Oto, MD;  Location: [facility];  Service: Orthopedics;  Laterality: N/A;  Central laminectomy L2-3, L3-4, L4-5   History   Social History  . Marital Status: Married    Spouse Name: N/A    Number of Children: N/A  . Years of Education: N/A   Social History Main Topics  . Smoking status: Current Every Day Smoker -- 1.00 packs/day for 30 years    Types: Cigarettes  . Smokeless tobacco: None  . Alcohol Use: 21.0 oz/week    35 Cans of beer per week  . Drug Use: No  . Sexual Activity: None   Other Topics Concern  . None   Social History Narrative  . None   History reviewed. No pertinent family history. Allergies  Allergen Reactions  . Sulfa Antibiotics Rash   Prior to Admission medications   Medication Sig Start Date End Date Taking? Authorizing Provider  doxycycline (ADOXA) 100 MG tablet Take 100 mg by mouth 2 (two) times daily.   Yes Historical Provider, MD  glimepiride (AMARYL) 4 MG tablet Take 4 mg by mouth daily before breakfast.   Yes Historical Provider, MD  lisinopril (PRINIVIL,ZESTRIL) 20 MG tablet Take 20 mg by mouth 2 (two) times daily.    Yes Historical Provider, MD  metoprolol succinate (TOPROL-XL) 100 MG 24 hr tablet Take 100 mg by mouth daily.    Yes Historical Provider, MD  sitaGLIPtin-metformin (JANUMET) 50-1000 MG per tablet Take 2 tablets by mouth daily with supper.   Yes  Historical Provider, MD  aspirin EC 81 MG tablet Take 81 mg by mouth daily.    Historical Provider, MD  Multiple Vitamins-Minerals (MULTIVITAMINS THER. W/MINERALS) TABS Take 1 tablet by mouth daily. Men's One a Day    Historical Provider, MD     Positive ROS: none  All other systems have been reviewed and were otherwise negative with the exception of those mentioned in the HPI and as above.  Physical Exam: Filed Vitals:   08/19/13 0937  BP: 196/82  Pulse: 67  Temp: 97.1 F (36.2 C)  Resp: 18    General: Alert, no acute distress Cardiovascular: No pedal edema Respiratory: No cyanosis, no use of accessory musculature GI: No organomegaly, abdomen is soft and non-tender Skin: No lesions in the area of chief complaint Neurologic: Sensation intact distally Psychiatric: Patient is competent for consent with normal mood and affect Lymphatic: No axillary or cervical lymphadenopathy  MUSCULOSKELETAL: l foot erythema and open 5th met-phal jt  Assessment/Plan: Cellulitis,osteomyelitis 5th toe and distal metatarsal Plan for Procedure(s): AMPUTATION RAY with toe 5th  The risks benefits and alternatives were discussed with the patient including but not limited to the risks of nonoperative treatment, versus surgical intervention including infection, bleeding, nerve injury, malunion, nonunion, hardware prominence, hardware failure, need for hardware removal, blood clots, cardiopulmonary complications, morbidity, mortality,  among others, and they were willing to proceed.  Predicted outcome is good, although there will be at least a six to nine month expected recovery.  Devontre Siedschlag L, MD 08/19/2013 11:10 AM

## 2013-08-19 NOTE — Brief Op Note (Signed)
08/19/2013  12:13 PM  PATIENT:  Alexander [name]  52 y.o. male  PRE-OPERATIVE DIAGNOSIS:  cellulitis,osteomyelitis  POST-OPERATIVE DIAGNOSIS:  * No post-op diagnosis entered *  PROCEDURE:  Procedure(s) with comments: AMPUTATION RAY (Left) - ray amputation left 5th  SURGEON:  Surgeon(s) and Role:    * Alta Corning, MD - Primary  PHYSICIAN ASSISTANT:   ASSISTANTS: bethune   ANESTHESIA:   general  EBL:  Total I/O In: 800 [I.V.:800] Out: 10 [Blood:10]  BLOOD ADMINISTERED:none  DRAINS: none   LOCAL MEDICATIONS USED:  NONE  SPECIMEN:  Source of Specimen:  l 5th toe  DISPOSITION OF SPECIMEN:  PATHOLOGY  COUNTS:  YES  TOURNIQUET:   Total Tourniquet Time Documented: Thigh (Left) - 14 minutes Total: Thigh (Left) - 14 minutes   DICTATION: .Other Dictation: Dictation Number [id number]  PLAN OF CARE: Discharge to home after PACU  PATIENT DISPOSITION:  PACU - hemodynamically stable.   Delay start of Pharmacological VTE agent (>24hrs) due to surgical blood loss or risk of bleeding: no

## 2013-08-19 NOTE — Anesthesia Preprocedure Evaluation (Addendum)
Anesthesia Evaluation  Patient identified by MRN, date of birth, ID band Patient awake    Reviewed: Allergy & Precautions, H&P , NPO status , Patient's Chart, lab work & pertinent test results, reviewed documented beta blocker date and time   History of Anesthesia Complications Negative for: history of anesthetic complications  Airway Mallampati: II TM Distance: >3 FB Neck ROM: full    Dental  (+) Poor Dentition and Dental Advisory Given   Pulmonary Current Smoker,  breath sounds clear to auscultation  Pulmonary exam normal       Cardiovascular hypertension, Pt. on medications and Pt. on home beta blockers - angina+ dysrhythmias (occ PVC) Rhythm:Regular Rate:Normal  5/14 ECHO: EF 55-60%, valves OK '12 stress test: no ischemia or scar   Neuro/Psych PSYCHIATRIC DISORDERS Chronic back pain Diabetic peripheral neuropathy    GI/Hepatic negative GI ROS, (+)     substance abuse (18 beers/day)  alcohol use,   Endo/Other  diabetes (glu 145), Well Controlled, Type 2, Oral Hypoglycemic Agents  Renal/GU negative Renal ROS     Musculoskeletal   Abdominal (+) + obese,   Peds  Hematology   Anesthesia Other Findings Drinks 12-18 beers per day  Reproductive/Obstetrics                       Anesthesia Physical Anesthesia Plan  ASA: III  Anesthesia Plan: Regional and MAC   Post-op Pain Management:    Induction: Intravenous  Airway Management Planned: Natural Airway and Simple Face Mask  Additional Equipment:   Intra-op Plan:   Post-operative Plan:   Informed Consent: I have reviewed the patients History and Physical, chart, labs and discussed the procedure including the risks, benefits and alternatives for the proposed anesthesia with the patient or authorized representative who has indicated his/her understanding and acceptance.   Dental Advisory Given  Plan Discussed with: Anesthesiologist, CRNA  and Surgeon  Anesthesia Plan Comments: (Plan routine monitors, ankle block with MAC)       Anesthesia Quick Evaluation

## 2013-08-19 NOTE — Anesthesia Procedure Notes (Addendum)
Anesthesia Regional Block:  Ankle block  Pre-Anesthetic Checklist: ,, timeout performed, Correct Patient, Correct Site, Correct Laterality, Correct Procedure, Correct Position, site marked, Risks and benefits discussed,  Surgical consent,  Pre-op evaluation,  At surgeon's request and post-op pain management  Laterality: Left  Prep: chloraprep       Needles:  Injection technique: Single-shot  Needle Type: Quincke      Needle Gauge: 25 and 25 G    Additional Needles:  Procedures: other  (infiltration) Ankle block Narrative:  Start time: 08/19/2013 10:55 AM End time: 08/19/2013 11:03 AM Injection made incrementally with aspirations every 5 mL.  Performed by: Personally  Anesthesiologist: Jenita Seashore, MD  Additional Notes: Pt identified in Holding room.  Monitors applied. Working IV access confirmed. Sterile prep L ankle.  #25ga local injection around deep and sup peroneal, saphenous, sural, and post tibial nerves.  Total 40cc 0.5% Bupivacaine.  Patient asymptomatic, VSS, no heme aspirated, tolerated well.  Jenita Seashore, MD  Ankle block Procedure Name: MAC Date/Time: 08/19/2013 11:30 AM Performed by: Neldon Newport Pre-anesthesia Checklist: Suction available, Emergency Drugs available, Patient identified, Timeout performed and Patient being monitored Patient Re-evaluated:Patient Re-evaluated prior to inductionOxygen Delivery Method: Simple face mask Placement Confirmation: positive ETCO2

## 2013-08-19 NOTE — Transfer of Care (Signed)
Immediate Anesthesia Transfer of Care Note  Patient: Alexander [name]  Procedure(s) Performed: Procedure(s) with comments: AMPUTATION RAY (Left) - ray amputation left 5th  Patient Location: Short Stay  Anesthesia Type:MAC  Level of Consciousness: awake, alert  and oriented  Airway & Oxygen Therapy: Patient Spontanous Breathing  Post-op Assessment: Report given to PACU RN, Post -op Vital signs reviewed and stable and Patient moving all extremities X 4  Post vital signs: Reviewed and stable  Complications: No apparent anesthesia complications

## 2013-08-20 NOTE — Op Note (Signed)
NAME:  Alexander [name], Alexander [name].:  [id number]  MEDICAL RECORD NO.:  [id number]  LOCATION:  [facility]                         FACILITY:  [facility]  PHYSICIAN:  Alta Corning, M.D.   DATE OF BIRTH:  September 02, 1961  DATE OF PROCEDURE:  08/19/2013 DATE OF DISCHARGE:  08/19/2013                              OPERATIVE REPORT   PREOPERATIVE DIAGNOSIS:  Osteomyelitis of fifth metatarsal head, left foot, with open fifth metatarsophalangeal joint.  POSTOPERATIVE DIAGNOSIS:  Osteomyelitis of fifth metatarsal head, left foot, with open fifth metatarsophalangeal joint.  PRINCIPAL PROCEDURES: 1. Fifth ray amputation. 2. Debridement of fourth metatarsal head and debridement of partial     fourth proximal phalanx condyle.  ASSISTANT:  Gary Fleet, PA.  ANESTHESIA:  General.  BRIEF HISTORY:  Alexander [name] is a 52 year old male with a history of having had open fifth metatarsophalangeal joint.  He had been treated for prolonged time in the [place] with no success.  After failure of conservative care, he was evaluated.  At that point, was wanting surgical intervention.  We got an MRI just to make sure there were no other findings.  MRI showed osteomyelitis of the fifth metatarsal head, and we felt that needed to go at the time of surgery and he was brought to the operating room for fifth toe amputation as well as a portion of the fifth ray.  DESCRIPTION OF PROCEDURE:  The patient was taken to the operating room. After adequate anesthesia was obtained with general anesthetic, the patient was placed supine on the operating table.  Left foot was prepped and draped in usual sterile fashion.  Following this, the leg was exsanguinated.  Blood pressure tourniquet inflated to 250 mmHg. Following this, an incision was made for amputation of the fifth toe and fifth metatarsal midline along the metatarsal and then a racket-style incision around the toe with care being taken to try to preserve as  much skin as we could.  We then dissected down to the metatarsophalangeal joint, it was really obviously infected.  The fifth metatarsal head was obviously soft and infected.  We resected this fifth metatarsal and sent it down to Pathology and then we cultured the remaining portion of the foot to see if there is any additional signs.  We rongeured a lot of tissue that seemed questionable in this area.  We then went to the metatarsal, still looked a little bit questionable where we were, dissected a little bit farther proximal, took a little bit more of the metatarsal until we got to what we felt was good bone.  At that point, we let the tourniquet down.  I was not overly impressed with the amount of blood flow that we had, so it was very sluggish in this area, which was unfortunate.  We used warm water irrigation at that point, got a little bit edge bleeding all around the wound, but certainly was sluggish.  At the very distal portion of the wound, we had a little bit of a difficult time closing, so we decompressed by taking a little portion probably 25% of the fourth metatarsal head and 25% of the fourth proximal phalanx condyle.  Once  we did this, it closed a little bit better and by this time, the bleeding was a little bit more vigorous around the wound edges.  Again irrigated, suction dried, closed with nylon interrupted sutures.  Sterile compressive dressing was applied, and the patient was taken to the recovery room, was noted to be in satisfactory condition.  Estimated blood loss for the procedure was minimal.     Alta Corning, M.D.     Corliss Skains  D:  08/19/2013  T:  08/20/2013  Job:  [id number]

## 2013-08-21 ENCOUNTER — Encounter (HOSPITAL_COMMUNITY): Payer: Self-pay | Admitting: Orthopedic Surgery

## 2013-08-22 LAB — WOUND CULTURE
Culture: NO GROWTH
Gram Stain: NONE SEEN

## 2013-08-24 LAB — ANAEROBIC CULTURE: Gram Stain: NONE SEEN

## 2013-10-21 ENCOUNTER — Encounter (HOSPITAL_COMMUNITY): Payer: Self-pay | Admitting: Emergency Medicine

## 2013-10-21 ENCOUNTER — Encounter (HOSPITAL_BASED_OUTPATIENT_CLINIC_OR_DEPARTMENT_OTHER): Payer: Medicaid Other

## 2013-10-21 ENCOUNTER — Inpatient Hospital Stay (HOSPITAL_COMMUNITY)
Admission: EM | Admit: 2013-10-21 | Discharge: 2013-11-09 | DRG: 475 | Disposition: A | Discharge status: Home / Self Care | Payer: Medicaid Other | Attending: Internal Medicine | Admitting: Internal Medicine

## 2013-10-21 ENCOUNTER — Emergency Department (HOSPITAL_COMMUNITY): Payer: Medicaid Other

## 2013-10-21 DIAGNOSIS — Z7982 Long term (current) use of aspirin: Secondary | ICD-10-CM

## 2013-10-21 DIAGNOSIS — D62 Acute posthemorrhagic anemia: Secondary | ICD-10-CM | POA: Diagnosis present

## 2013-10-21 DIAGNOSIS — N179 Acute kidney failure, unspecified: Secondary | ICD-10-CM

## 2013-10-21 DIAGNOSIS — M48062 Spinal stenosis, lumbar region with neurogenic claudication: Secondary | ICD-10-CM

## 2013-10-21 DIAGNOSIS — I96 Gangrene, not elsewhere classified: Secondary | ICD-10-CM | POA: Insufficient documentation

## 2013-10-21 DIAGNOSIS — Z8249 Family history of ischemic heart disease and other diseases of the circulatory system: Secondary | ICD-10-CM

## 2013-10-21 DIAGNOSIS — E8770 Fluid overload, unspecified: Secondary | ICD-10-CM | POA: Clinically undetermined

## 2013-10-21 DIAGNOSIS — L97509 Non-pressure chronic ulcer of other part of unspecified foot with unspecified severity: Secondary | ICD-10-CM | POA: Diagnosis present

## 2013-10-21 DIAGNOSIS — E1122 Type 2 diabetes mellitus with diabetic chronic kidney disease: Secondary | ICD-10-CM | POA: Diagnosis present

## 2013-10-21 DIAGNOSIS — Z79899 Other long term (current) drug therapy: Secondary | ICD-10-CM

## 2013-10-21 DIAGNOSIS — D72829 Elevated white blood cell count, unspecified: Secondary | ICD-10-CM | POA: Diagnosis present

## 2013-10-21 DIAGNOSIS — Z6833 Body mass index (BMI) 33.0-33.9, adult: Secondary | ICD-10-CM

## 2013-10-21 DIAGNOSIS — Y838 Other surgical procedures as the cause of abnormal reaction of the patient, or of later complication, without mention of misadventure at the time of the procedure: Secondary | ICD-10-CM | POA: Diagnosis present

## 2013-10-21 DIAGNOSIS — T8789 Other complications of amputation stump: Principal | ICD-10-CM | POA: Diagnosis present

## 2013-10-21 DIAGNOSIS — I739 Peripheral vascular disease, unspecified: Secondary | ICD-10-CM | POA: Diagnosis present

## 2013-10-21 DIAGNOSIS — E119 Type 2 diabetes mellitus without complications: Secondary | ICD-10-CM

## 2013-10-21 DIAGNOSIS — E1169 Type 2 diabetes mellitus with other specified complication: Secondary | ICD-10-CM | POA: Diagnosis present

## 2013-10-21 DIAGNOSIS — F17200 Nicotine dependence, unspecified, uncomplicated: Secondary | ICD-10-CM

## 2013-10-21 DIAGNOSIS — S98139A Complete traumatic amputation of one unspecified lesser toe, initial encounter: Secondary | ICD-10-CM

## 2013-10-21 DIAGNOSIS — I708 Atherosclerosis of other arteries: Secondary | ICD-10-CM | POA: Diagnosis present

## 2013-10-21 DIAGNOSIS — I10 Essential (primary) hypertension: Secondary | ICD-10-CM | POA: Diagnosis present

## 2013-10-21 DIAGNOSIS — F1010 Alcohol abuse, uncomplicated: Secondary | ICD-10-CM

## 2013-10-21 DIAGNOSIS — E8779 Other fluid overload: Secondary | ICD-10-CM | POA: Diagnosis not present

## 2013-10-21 DIAGNOSIS — M9080 Osteopathy in diseases classified elsewhere, unspecified site: Secondary | ICD-10-CM | POA: Diagnosis present

## 2013-10-21 DIAGNOSIS — N5089 Other specified disorders of the male genital organs: Secondary | ICD-10-CM | POA: Diagnosis not present

## 2013-10-21 DIAGNOSIS — E871 Hypo-osmolality and hyponatremia: Secondary | ICD-10-CM | POA: Diagnosis present

## 2013-10-21 DIAGNOSIS — M869 Osteomyelitis, unspecified: Secondary | ICD-10-CM | POA: Diagnosis present

## 2013-10-21 DIAGNOSIS — Z889 Allergy status to unspecified drugs, medicaments and biological substances status: Secondary | ICD-10-CM

## 2013-10-21 DIAGNOSIS — E1159 Type 2 diabetes mellitus with other circulatory complications: Secondary | ICD-10-CM | POA: Insufficient documentation

## 2013-10-21 DIAGNOSIS — D649 Anemia, unspecified: Secondary | ICD-10-CM | POA: Diagnosis present

## 2013-10-21 DIAGNOSIS — T50995A Adverse effect of other drugs, medicaments and biological substances, initial encounter: Secondary | ICD-10-CM | POA: Diagnosis not present

## 2013-10-21 LAB — CBC
MCH: 31.2 pg (ref 26.0–34.0)
MCHC: 36.5 g/dL — ABNORMAL HIGH (ref 30.0–36.0)
MCV: 85.6 fL (ref 78.0–100.0)
Platelets: 266 10*3/uL (ref 150–400)
RDW: 11.5 % (ref 11.5–15.5)

## 2013-10-21 LAB — URINALYSIS, ROUTINE W REFLEX MICROSCOPIC
Ketones, ur: 40 mg/dL — AB
Leukocytes, UA: NEGATIVE
Nitrite: NEGATIVE
Protein, ur: >300 mg/dL — AB

## 2013-10-21 LAB — CBC WITH DIFFERENTIAL/PLATELET
Basophils Relative: 0 % (ref 0–1)
Eosinophils Absolute: 0 10*3/uL (ref 0.0–0.7)
HCT: 27.6 % — ABNORMAL LOW (ref 39.0–52.0)
Hemoglobin: 10.2 g/dL — ABNORMAL LOW (ref 13.0–17.0)
Lymphocytes Relative: 11 % — ABNORMAL LOW (ref 12–46)
Lymphs Abs: 1.4 10*3/uL (ref 0.7–4.0)
MCH: 31.5 pg (ref 26.0–34.0)
MCHC: 37 g/dL — ABNORMAL HIGH (ref 30.0–36.0)
MCV: 85.2 fL (ref 78.0–100.0)
Monocytes Absolute: 1.2 10*3/uL — ABNORMAL HIGH (ref 0.1–1.0)
Monocytes Relative: 9 % (ref 3–12)
Neutro Abs: 10.3 10*3/uL — ABNORMAL HIGH (ref 1.7–7.7)
Neutrophils Relative %: 80 % — ABNORMAL HIGH (ref 43–77)
RBC: 3.24 MIL/uL — ABNORMAL LOW (ref 4.22–5.81)

## 2013-10-21 LAB — URINE MICROSCOPIC-ADD ON

## 2013-10-21 LAB — COMPREHENSIVE METABOLIC PANEL
ALT: 26 U/L (ref 0–53)
AST: 20 U/L (ref 0–37)
Albumin: 2.4 g/dL — ABNORMAL LOW (ref 3.5–5.2)
Alkaline Phosphatase: 80 U/L (ref 39–117)
CO2: 22 mEq/L (ref 19–32)
Chloride: 88 mEq/L — ABNORMAL LOW (ref 96–112)
Creatinine, Ser: 0.93 mg/dL (ref 0.50–1.35)
Potassium: 4.5 mEq/L (ref 3.5–5.1)
Total Bilirubin: 0.5 mg/dL (ref 0.3–1.2)
Total Protein: 6 g/dL (ref 6.0–8.3)

## 2013-10-21 LAB — PHOSPHORUS: Phosphorus: 4.5 mg/dL (ref 2.3–4.6)

## 2013-10-21 LAB — BASIC METABOLIC PANEL
Calcium: 8.6 mg/dL (ref 8.4–10.5)
Creatinine, Ser: 0.89 mg/dL (ref 0.50–1.35)
GFR calc Af Amer: >90 mL/min (ref >90)

## 2013-10-21 LAB — GLUCOSE, CAPILLARY: Glucose-Capillary: 140 mg/dL — ABNORMAL HIGH (ref 70–99)

## 2013-10-21 MED ORDER — ADULT MULTIVITAMIN W/MINERALS CH
1.0000 | ORAL_TABLET | Freq: Every day | ORAL | Status: DC
  Administered 2013-10-21 – 2013-11-09 (×18): 1 via ORAL
  Filled 2013-10-21 (×20): qty 1

## 2013-10-21 MED ORDER — INFLUENZA VAC SPLIT QUAD 0.5 ML IM SUSP
0.5000 mL | INTRAMUSCULAR | Status: AC
  Administered 2013-10-22: 0.5 mL via INTRAMUSCULAR
  Filled 2013-10-21 (×2): qty 0.5

## 2013-10-21 MED ORDER — LISINOPRIL 20 MG PO TABS
20.0000 mg | ORAL_TABLET | Freq: Two times a day (BID) | ORAL | Status: DC
  Administered 2013-10-21 – 2013-10-27 (×13): 20 mg via ORAL
  Filled 2013-10-21 (×16): qty 1

## 2013-10-21 MED ORDER — SODIUM CHLORIDE 0.9 % IV SOLN
INTRAVENOUS | Status: DC
  Administered 2013-10-21 – 2013-10-22 (×2): via INTRAVENOUS

## 2013-10-21 MED ORDER — HYDRALAZINE HCL 20 MG/ML IJ SOLN
10.0000 mg | Freq: Four times a day (QID) | INTRAMUSCULAR | Status: DC | PRN
  Administered 2013-10-24 – 2013-10-27 (×8): 10 mg via INTRAVENOUS
  Filled 2013-10-21 (×8): qty 1

## 2013-10-21 MED ORDER — SITAGLIPTIN PHOS-METFORMIN HCL 50-1000 MG PO TABS: 2.0000 | ORAL_TABLET | Freq: Every day | ORAL | Status: DC

## 2013-10-21 MED ORDER — ACETAMINOPHEN 325 MG PO TABS: 650.0000 mg | ORAL_TABLET | Freq: Four times a day (QID) | ORAL | Status: DC | PRN

## 2013-10-21 MED ORDER — PIPERACILLIN-TAZOBACTAM 3.375 G IVPB 30 MIN
3.3750 g | INTRAVENOUS | Status: AC
  Administered 2013-10-21: 3.375 g via INTRAVENOUS
  Filled 2013-10-21: qty 50

## 2013-10-21 MED ORDER — PIPERACILLIN-TAZOBACTAM 3.375 G IVPB
3.3750 g | Freq: Three times a day (TID) | INTRAVENOUS | Status: DC
  Administered 2013-10-22 – 2013-10-30 (×25): 3.375 g via INTRAVENOUS
  Filled 2013-10-21 (×28): qty 50

## 2013-10-21 MED ORDER — METOPROLOL SUCCINATE ER 100 MG PO TB24
100.0000 mg | ORAL_TABLET | Freq: Every day | ORAL | Status: DC
  Administered 2013-10-21 – 2013-11-01 (×12): 100 mg via ORAL
  Filled 2013-10-21 (×13): qty 1

## 2013-10-21 MED ORDER — VANCOMYCIN HCL IN DEXTROSE 1-5 GM/200ML-% IV SOLN
1000.0000 mg | Freq: Two times a day (BID) | INTRAVENOUS | Status: DC
  Administered 2013-10-22 – 2013-10-28 (×13): 1000 mg via INTRAVENOUS
  Filled 2013-10-21 (×16): qty 200

## 2013-10-21 MED ORDER — PNEUMOCOCCAL VAC POLYVALENT 25 MCG/0.5ML IJ INJ
0.5000 mL | INJECTION | INTRAMUSCULAR | Status: AC
  Administered 2013-10-22: 0.5 mL via INTRAMUSCULAR
  Filled 2013-10-21 (×2): qty 0.5

## 2013-10-21 MED ORDER — VANCOMYCIN HCL 10 G IV SOLR
2000.0000 mg | INTRAVENOUS | Status: AC
  Administered 2013-10-21: 2000 mg via INTRAVENOUS
  Filled 2013-10-21: qty 2000

## 2013-10-21 MED ORDER — GLIMEPIRIDE 4 MG PO TABS
4.0000 mg | ORAL_TABLET | Freq: Every day | ORAL | Status: DC
  Administered 2013-10-22 – 2013-10-31 (×8): 4 mg via ORAL
  Filled 2013-10-21 (×11): qty 1

## 2013-10-21 MED ORDER — HYDROMORPHONE HCL PF 1 MG/ML IJ SOLN
1.0000 mg | INTRAMUSCULAR | Status: AC | PRN
  Administered 2013-10-21: 1 mg via INTRAVENOUS
  Filled 2013-10-21 (×2): qty 1

## 2013-10-21 MED ORDER — METFORMIN HCL 500 MG PO TABS
2000.0000 mg | ORAL_TABLET | Freq: Every day | ORAL | Status: DC
  Administered 2013-10-22 – 2013-10-27 (×5): 2000 mg via ORAL
  Filled 2013-10-21 (×8): qty 4

## 2013-10-21 MED ORDER — SODIUM CHLORIDE 0.9 % IJ SOLN
3.0000 mL | Freq: Two times a day (BID) | INTRAMUSCULAR | Status: DC
  Administered 2013-10-24 – 2013-11-09 (×16): 3 mL via INTRAVENOUS

## 2013-10-21 MED ORDER — HYDROMORPHONE HCL PF 1 MG/ML IJ SOLN
1.0000 mg | Freq: Once | INTRAMUSCULAR | Status: AC
  Administered 2013-10-21: 1 mg via INTRAVENOUS
  Filled 2013-10-21: qty 1

## 2013-10-21 MED ORDER — ONDANSETRON HCL 4 MG/2ML IJ SOLN: 4.0000 mg | Freq: Three times a day (TID) | INTRAMUSCULAR | Status: AC | PRN

## 2013-10-21 MED ORDER — LINAGLIPTIN 5 MG PO TABS
5.0000 mg | ORAL_TABLET | Freq: Every day | ORAL | Status: DC
  Administered 2013-10-22 – 2013-10-31 (×9): 5 mg via ORAL
  Filled 2013-10-21 (×11): qty 1

## 2013-10-21 MED ORDER — ASPIRIN EC 81 MG PO TBEC
81.0000 mg | DELAYED_RELEASE_TABLET | Freq: Every day | ORAL | Status: DC
  Administered 2013-10-21 – 2013-11-09 (×18): 81 mg via ORAL
  Filled 2013-10-21 (×20): qty 1

## 2013-10-21 MED ORDER — ONDANSETRON HCL 4 MG/2ML IJ SOLN
4.0000 mg | Freq: Once | INTRAMUSCULAR | Status: AC
  Administered 2013-10-21: 4 mg via INTRAVENOUS
  Filled 2013-10-21: qty 2

## 2013-10-21 MED ORDER — HYDROCODONE-ACETAMINOPHEN 5-325 MG PO TABS
1.0000 | ORAL_TABLET | ORAL | Status: DC | PRN
  Administered 2013-10-21 – 2013-10-23 (×3): 2 via ORAL
  Administered 2013-10-27: 1 via ORAL
  Administered 2013-10-28 – 2013-10-30 (×5): 2 via ORAL
  Administered 2013-11-01 (×2): 1 via ORAL
  Administered 2013-11-05: 2 via ORAL
  Filled 2013-10-21 (×2): qty 2
  Filled 2013-10-21: qty 1
  Filled 2013-10-21 (×4): qty 2
  Filled 2013-10-21 (×2): qty 1
  Filled 2013-10-21 (×3): qty 2

## 2013-10-21 MED ORDER — ACETAMINOPHEN 650 MG RE SUPP: 650.0000 mg | Freq: Four times a day (QID) | RECTAL | Status: DC | PRN

## 2013-10-21 NOTE — Progress Notes (Signed)
ANTIBIOTIC CONSULT NOTE - INITIAL  Pharmacy Consult for Vancomycin, Zosyn Indication: Gangrenous diabetic foot wound   Allergies  Allergen Reactions  . Sulfa Antibiotics Rash    Patient Measurements:   Adjusted Body Weight:   Vital Signs: Temp: 99.5 F (37.5 C) (12/10 1452) Temp src: Oral (12/10 1452) BP: 196/77 mmHg (12/10 1452) Pulse Rate: 91 (12/10 1452) Intake/Output from previous day:   Intake/Output from this shift:    Labs: No results found for this basename: WBC, HGB, PLT, LABCREA, CREATININE,  in the last 72 hours The CrCl is unknown because both a height and weight (above a minimum accepted value) are required for this calculation. No results found for this basename: VANCOTROUGH, VANCOPEAK, VANCORANDOM, GENTTROUGH, GENTPEAK, GENTRANDOM, TOBRATROUGH, TOBRAPEAK, TOBRARND, AMIKACINPEAK, AMIKACINTROU, AMIKACIN,  in the last 72 hours   Microbiology: No results found for this or any previous visit (from the past 720 hour(s)).  Medical History: Past Medical History  Diagnosis Date  . Hypertension   . Dysrhythmia     h/o pvc's  . Arthritis   . Diabetes mellitus     dx---been  awhile    Assessment: 52yo male with poorly controlled diabetes and recent left 5th toe amputation due to osteomyelitis presents to [facility] with gangrenous foot/left fourth toe and fever.  Pharmacy consulted to dose vancomycin and zoysn.    No vancomycin trough levels noted in [facility].  Allergy to sulfa antibiotics (rash) Height = 6', Weight = 240lb (109kg) per RN  No microbiology  Tm24h: >100 F per pt's wound care physician (Dr. Jerline Pain) Renal: SCr 0.74 (08/17/13), CrCl >100 (CG and N), labs today pending WBC: labs today pending  Goal of Therapy:  Vancomycin trough level 15-20 mcg/ml Eradication of infection  Plan:  1. Vancomycin 2000mg  IV x 1, then 1000mg  IV q 12 hours 2. Zosyn extended infection 3.375g IV q 8 hours  Ralene Bathe, PharmD, BCPS 10/21/2013, 4:04 PM  Pager: [phone]

## 2013-10-21 NOTE — ED Provider Notes (Signed)
Medical screening examination/treatment/procedure(s) were conducted as a shared visit with non-physician practitioner(s) and myself.  I personally evaluated the patient during the encounter.  EKG Interpretation   None      52yM with L foot pain, pain and swelling. 4th digit purple, cooler than adjacent toe and insensate. Two ulcerations to lateral aspect of foot. Proximal one near mid foot dry and seems superficial. Distal one is soupy and foul smelling. Head of 4th metatarsal palpated at base. Diffuse swelling of foot and faint erythema. Weak doppler able DP pulse.   Clinically osteomyelitis. To me seems beyond point of wound care and abx. Hx of diabetes. Pt also continues to smoke. Discussed he needs to stop this immediately. Basic labs. Abx. Ortho consult. Admission.   Virgel Manifold, MD 10/21/13 [phone]

## 2013-10-21 NOTE — Progress Notes (Signed)
CRITICAL VALUE ALERT  Critical value received:  NA 119  Date of notification:  10/21/13  Time of notification:  2104  Critical value read back:yes  Nurse who received alert:Kyon Bentler Foust-Brown  MD notified (1st page):  Tylene Fantasia  Time of first page:  2105   Time MD responded: BMP order in am at 2110

## 2013-10-21 NOTE — Progress Notes (Signed)
   CARE MANAGEMENT ED NOTE 10/21/2013  Patient:  Alexander [name], Alexander [name]   Account Number:  [id number]  Date Initiated:  10/21/2013  Documentation initiated by:  Westly Pam Assessment:   52 yr old male who states he has recently lost his job and no longer has his [employer] short term disability when he stopped working in April 2014 related to his 5th toe amputation Confirms he is followed by Dr Jerline Pain but no longer able to see     Subjective/Objective Assessment Detail:   Dr Dema Severin related a bill/collections agency issue connected with visits to the CV in the same practice.  Reports he was "blocked" from seeing the pcp.  States the wife brings in the only income Confirms [facility] staff provided resources     Action/Plan:   CM reviewed CM consult & [facility] notes for pt CM spoke with pt & wife about consult CM encouraged use of [facility] services offered. see notes below Updated EPIC for pcp, providers   Action/Plan Detail:   Anticipated DC Date:  10/24/2013     Status Recommendation to Physician:   Result of Recommendation:    Other ED Services  Consult Working [place]  [facility] - Pt will follow up  Other  PCP issues  CM consult    Choice offered to / List presented to:            Status of service:  Completed, signed off  ED Comments:  Stormy Fabian  Signed  Progress Notes Service date: 10/21/2013 4:23 PM [facility] provided pt with a list of primary care resources, information on [employer], and a [employer] application.   ED Comments Detail:  CM spoke with pt who confirms self pay [facility] resident with no pcp. CM discussed and provided written information for self pay pcps, importance of pcp for f/u care, www.needymeds.org, discounted pharmacies and other [employer] such as financial assistance, DSS and  health department  Reviewed resources for [employer] self pay pcps like [name], family medicine at [address], [facility] family practice, general medical clinics, [facility] urgent care plus others, [facility] out patient pharmacies and housing Wife inquired about who to provide the online copy of the disability papers to and CM referred her to Dr Jerline Pain since he is the provider providing primary services at this time Encouraged use of needymeds.org, and financial resources.  Pt voiced understanding and appreciation of resources provided
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
## 2013-10-21 NOTE — Progress Notes (Signed)
[facility] provided pt with a list of primary care resources, information on [facility], and a [facility] [employer] application.

## 2013-10-21 NOTE — ED Notes (Signed)
Pt diabetic; recent left 5th toe amputation Oct/14 by Dr Berenice Primas; being treated at wound center; sent here due to gangrenous side of foot/left fourth toe; foul smelling with fever per note from Dr Jerline Pain

## 2013-10-21 NOTE — ED Notes (Signed)
Pt is aware of the need for urine. Urinal at bedside.
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
## 2013-10-22 ENCOUNTER — Inpatient Hospital Stay (HOSPITAL_COMMUNITY): Payer: Medicaid Other

## 2013-10-22 ENCOUNTER — Encounter (HOSPITAL_COMMUNITY): Payer: Self-pay | Admitting: Radiology

## 2013-10-22 DIAGNOSIS — I10 Essential (primary) hypertension: Secondary | ICD-10-CM

## 2013-10-22 DIAGNOSIS — E871 Hypo-osmolality and hyponatremia: Secondary | ICD-10-CM

## 2013-10-22 DIAGNOSIS — I70269 Atherosclerosis of native arteries of extremities with gangrene, unspecified extremity: Secondary | ICD-10-CM

## 2013-10-22 LAB — BASIC METABOLIC PANEL
BUN: 17 mg/dL (ref 6–23)
Chloride: 89 mEq/L — ABNORMAL LOW (ref 96–112)
GFR calc Af Amer: 78 mL/min — ABNORMAL LOW (ref >90)
Glucose, Bld: 78 mg/dL (ref 70–99)
Sodium: 122 mEq/L — ABNORMAL LOW (ref 135–145)

## 2013-10-22 LAB — TSH: TSH: 2.048 u[IU]/mL (ref 0.350–4.500)

## 2013-10-22 LAB — GLUCOSE, CAPILLARY: Glucose-Capillary: 83 mg/dL (ref 70–99)

## 2013-10-22 LAB — CBC
HCT: 27.9 % — ABNORMAL LOW (ref 39.0–52.0)
Hemoglobin: 10.3 g/dL — ABNORMAL LOW (ref 13.0–17.0)
MCH: 31.7 pg (ref 26.0–34.0)
MCV: 85.8 fL (ref 78.0–100.0)
RBC: 3.25 MIL/uL — ABNORMAL LOW (ref 4.22–5.81)
WBC: 10.8 10*3/uL — ABNORMAL HIGH (ref 4.0–10.5)

## 2013-10-22 MED ORDER — SODIUM CHLORIDE 0.9 % IV SOLN
INTRAVENOUS | Status: DC
  Administered 2013-10-22 – 2013-10-26 (×5): via INTRAVENOUS

## 2013-10-22 NOTE — Progress Notes (Signed)
During AM huddle MD reported pt is no longer having surgery today and  Michela Pitcher it was ok for pt to eat. Order placed. Vwilliams,rn.

## 2013-10-22 NOTE — Consult Note (Signed)
HPI: Alexander [name] is an 52 y.o. male with HTN, DM, tobacco abuse and a non healing wound of the left foot. Really never c/o claudication or rest pain. [facility] consult reviewed [place] studies of LE arterial flow. Significant disease on left, though not clear what level. IR is asked to perform Aortoiliac angiogram with LE runoff and possible PTA. PMHx and meds reviewed.  Past Medical History:  Past Medical History  Diagnosis Date  . Hypertension   . Dysrhythmia     h/o pvc's  . Arthritis   . Diabetes mellitus     dx---been  awhile    Past Surgical History:  Past Surgical History  Procedure Laterality Date  . Lumbar laminectomy  06/13/2012    Procedure: MICRODISCECTOMY LUMBAR LAMINECTOMY;  Surgeon: Jessy Oto, MD;  Location: [place];  Service: Orthopedics;  Laterality: N/A;  Central laminectomy L2-3, L3-4, L4-5  . Amputation Left 08/19/2013    Procedure: AMPUTATION RAY;  Surgeon: Alta Corning, MD;  Location: [place];  Service: Orthopedics;  Laterality: Left;  ray amputation left 5th    Family History: History reviewed. No pertinent family history.  Social History:  reports that he has been smoking Cigarettes.  He has a 45 pack-year smoking history. He has never used smokeless tobacco. He reports that he drinks about 21.0 ounces of alcohol per week. He reports that he does not use illicit drugs.  Allergies:  Allergies  Allergen Reactions  . Sulfa Antibiotics Rash    Medications:   Medication List    ASK your doctor about these medications       acetaminophen-codeine 300-30 MG per tablet  Commonly known as:  TYLENOL #3  Take 2 tablets by mouth every 4 (four) hours as needed for moderate pain.     aspirin EC 81 MG tablet  Take 81 mg by mouth daily.     glimepiride 4 MG tablet  Commonly known as:  AMARYL  Take 4 mg by mouth daily before breakfast.     lisinopril 20 MG tablet  Commonly known as:  PRINIVIL,ZESTRIL  Take 20 mg by mouth 2 (two) times daily.     metoprolol  succinate 100 MG 24 hr tablet  Commonly known as:  TOPROL-XL  Take 100 mg by mouth daily.     multivitamins ther. w/minerals Tabs tablet  Take 1 tablet by mouth daily. Men's One a Day     sitaGLIPtin-metformin 50-1000 MG per tablet  Commonly known as:  JANUMET  Take 2 tablets by mouth daily with supper.        Please HPI for pertinent positives, otherwise complete 10 system ROS negative.  Physical Exam: BP 143/76  Pulse 73  Temp(Src) 98.2 F (36.8 C) (Oral)  Resp 18  Ht 6' (1.829 m)  Wt 240 lb (108.863 kg)  BMI 32.54 kg/m2  SpO2 97% Body mass index is 32.54 kg/(m^2).   General Appearance:  Alert, cooperative, no distress, appears stated age  Head:  Normocephalic, without obvious abnormality, atraumatic  ENT: Unremarkable  Neck: Supple, symmetrical, trachea midline  Lungs:   Clear to auscultation bilaterally, no w/r/r  Chest Wall:  No tenderness or deformity  Heart:  Regular rate and rhythm, S1, S2 normal, no murmur, rub or gallop.  Abdomen:   Soft, non-tender, non distended.  Extremities: Dressing on left foot.  Pulses: 2+ and symmetric femoral. Cannot feel LLE pulses from popliteal distal.  Neurologic: Normal affect, no gross deficits.   Results for orders placed during the  hospital encounter of 10/21/13 (from the past 48 hour(s))  GLUCOSE, CAPILLARY     Status: Abnormal   Collection Time    10/21/13  2:54 PM      Result Value Range   Glucose-Capillary 140 (*) 70 - 99 mg/dL  CBC     Status: Abnormal   Collection Time    10/21/13  3:45 PM      Result Value Range   WBC 12.5 (*) 4.0 - 10.5 K/uL   RBC 3.33 (*) 4.22 - 5.81 MIL/uL   Hemoglobin 10.4 (*) 13.0 - 17.0 g/dL   HCT 28.5 (*) 39.0 - 52.0 %   MCV 85.6  78.0 - 100.0 fL   MCH 31.2  26.0 - 34.0 pg   MCHC 36.5 (*) 30.0 - 36.0 g/dL   RDW 11.5  11.5 - 15.5 %   Platelets 266  150 - 400 K/uL  BASIC METABOLIC PANEL     Status: Abnormal   Collection Time    10/21/13  3:45 PM      Result Value Range   Sodium 116  (*) 135 - 145 mEq/L   Comment: CRITICAL RESULT CALLED TO, READ BACK BY AND VERIFIED WITH:     QUICK,M AT 1725 ON 121014 BY POTEAT,S   Potassium 5.0  3.5 - 5.1 mEq/L   Chloride 86 (*) 96 - 112 mEq/L   CO2 19  19 - 32 mEq/L   Glucose, Bld 141 (*) 70 - 99 mg/dL   BUN 14  6 - 23 mg/dL   Creatinine, Ser 0.89  0.50 - 1.35 mg/dL   Calcium 8.6  8.4 - 10.5 mg/dL   GFR calc non Af Amer >90  >90 mL/min   GFR calc Af Amer >90  >90 mL/min   Comment: (NOTE)     The eGFR has been calculated using the CKD EPI equation.     This calculation has not been validated in all clinical situations.     eGFR's persistently <90 mL/min signify possible Chronic Kidney     Disease.  URINALYSIS, ROUTINE W REFLEX MICROSCOPIC     Status: Abnormal   Collection Time    10/21/13  5:23 PM      Result Value Range   Color, Urine AMBER (*) YELLOW   Comment: BIOCHEMICALS MAY BE AFFECTED BY COLOR   APPearance CLOUDY (*) CLEAR   Specific Gravity, Urine 1.025  1.005 - 1.030   pH 5.5  5.0 - 8.0   Glucose, UA 100 (*) NEGATIVE mg/dL   Hgb urine dipstick LARGE (*) NEGATIVE   Bilirubin Urine MODERATE (*) NEGATIVE   Ketones, ur 40 (*) NEGATIVE mg/dL   Protein, ur >300 (*) NEGATIVE mg/dL   Urobilinogen, UA 1.0  0.0 - 1.0 mg/dL   Nitrite NEGATIVE  NEGATIVE   Leukocytes, UA NEGATIVE  NEGATIVE  SODIUM, URINE, RANDOM     Status: None   Collection Time    10/21/13  5:23 PM      Result Value Range   Sodium, Ur 15     Comment: Performed at [facility] 12/11 AT 1241: PREVIOUSLY REPORTED AS 14  OSMOLALITY, URINE     Status: None   Collection Time    10/21/13  5:23 PM      Result Value Range   Osmolality, Ur 469  390 - 1090 mOsm/kg   Comment: Performed at [facility] ON     Status: Abnormal  Collection Time    10/21/13  5:23 PM      Result Value Range   WBC, UA 0-2  <3 WBC/hpf   Bacteria, UA MANY (*) RARE   Urine-Other MUCOUS PRESENT    COMPREHENSIVE METABOLIC  PANEL     Status: Abnormal   Collection Time    10/21/13  8:07 PM      Result Value Range   Sodium 119 (*) 135 - 145 mEq/L   Comment: CRITICAL RESULT CALLED TO, READ BACK BY AND VERIFIED WITH:     BROWN,S RN @2105  ON 12.10.2014 BY MCREYNOLDS,B   Potassium 4.5  3.5 - 5.1 mEq/L   Chloride 88 (*) 96 - 112 mEq/L   CO2 22  19 - 32 mEq/L   Glucose, Bld 123 (*) 70 - 99 mg/dL   BUN 15  6 - 23 mg/dL   Creatinine, Ser 0.93  0.50 - 1.35 mg/dL   Calcium 8.5  8.4 - 10.5 mg/dL   Total Protein 6.0  6.0 - 8.3 g/dL   Albumin 2.4 (*) 3.5 - 5.2 g/dL   AST 20  0 - 37 U/L   ALT 26  0 - 53 U/L   Alkaline Phosphatase 80  39 - 117 U/L   Total Bilirubin 0.5  0.3 - 1.2 mg/dL   GFR calc non Af Amer >90  >90 mL/min   GFR calc Af Amer >90  >90 mL/min   Comment: (NOTE)     The eGFR has been calculated using the CKD EPI equation.     This calculation has not been validated in all clinical situations.     eGFR's persistently <90 mL/min signify possible Chronic Kidney     Disease.  MAGNESIUM     Status: None   Collection Time    10/21/13  8:07 PM      Result Value Range   Magnesium 2.1  1.5 - 2.5 mg/dL  PHOSPHORUS     Status: None   Collection Time    10/21/13  8:07 PM      Result Value Range   Phosphorus 4.5  2.3 - 4.6 mg/dL  CBC WITH DIFFERENTIAL     Status: Abnormal   Collection Time    10/21/13  8:07 PM      Result Value Range   WBC 12.9 (*) 4.0 - 10.5 K/uL   RBC 3.24 (*) 4.22 - 5.81 MIL/uL   Hemoglobin 10.2 (*) 13.0 - 17.0 g/dL   HCT 27.6 (*) 39.0 - 52.0 %   MCV 85.2  78.0 - 100.0 fL   MCH 31.5  26.0 - 34.0 pg   MCHC 37.0 (*) 30.0 - 36.0 g/dL   RDW 11.5  11.5 - 15.5 %   Platelets 275  150 - 400 K/uL   Neutrophils Relative % 80 (*) 43 - 77 %   Lymphocytes Relative 11 (*) 12 - 46 %   Monocytes Relative 9  3 - 12 %   Eosinophils Relative 0  0 - 5 %   Basophils Relative 0  0 - 1 %   Neutro Abs 10.3 (*) 1.7 - 7.7 K/uL   Lymphs Abs 1.4  0.7 - 4.0 K/uL   Monocytes Absolute 1.2 (*) 0.1 - 1.0  K/uL   Eosinophils Absolute 0.0  0.0 - 0.7 K/uL   Basophils Absolute 0.0  0.0 - 0.1 K/uL   WBC Morphology TOXIC GRANULATION    APTT     Status: Abnormal   Collection Time  10/21/13  8:07 PM      Result Value Range   aPTT 39 (*) 24 - 37 seconds   Comment:            IF BASELINE aPTT IS ELEVATED,     SUGGEST PATIENT RISK ASSESSMENT     BE USED TO DETERMINE APPROPRIATE     ANTICOAGULANT THERAPY.  PROTIME-INR     Status: Abnormal   Collection Time    10/21/13  8:07 PM      Result Value Range   Prothrombin Time 15.4 (*) 11.6 - 15.2 seconds   INR 1.25  0.00 - 1.49  TSH     Status: None   Collection Time    10/21/13  8:07 PM      Result Value Range   TSH 2.048  0.350 - 4.500 uIU/mL   Comment: Performed at [facility]  CBC     Status: Abnormal   Collection Time    10/22/13  5:25 AM      Result Value Range   WBC 10.8 (*) 4.0 - 10.5 K/uL   RBC 3.25 (*) 4.22 - 5.81 MIL/uL   Hemoglobin 10.3 (*) 13.0 - 17.0 g/dL   HCT 27.9 (*) 39.0 - 52.0 %   MCV 85.8  78.0 - 100.0 fL   MCH 31.7  26.0 - 34.0 pg   MCHC 36.9 (*) 30.0 - 36.0 g/dL   RDW 11.6  11.5 - 15.5 %   Platelets 281  150 - 400 K/uL  BASIC METABOLIC PANEL     Status: Abnormal   Collection Time    10/22/13  5:25 AM      Result Value Range   Sodium 122 (*) 135 - 145 mEq/L   Potassium 4.4  3.5 - 5.1 mEq/L   Chloride 89 (*) 96 - 112 mEq/L   CO2 20  19 - 32 mEq/L   Glucose, Bld 78  70 - 99 mg/dL   BUN 17  6 - 23 mg/dL   Creatinine, Ser 1.19  0.50 - 1.35 mg/dL   Calcium 8.4  8.4 - 10.5 mg/dL   GFR calc non Af Amer 68 (*) >90 mL/min   GFR calc Af Amer 78 (*) >90 mL/min   Comment: (NOTE)     The eGFR has been calculated using the CKD EPI equation.     This calculation has not been validated in all clinical situations.     eGFR's persistently <90 mL/min signify possible Chronic Kidney     Disease.  GLUCOSE, CAPILLARY     Status: None   Collection Time    10/22/13 10:43 AM      Result Value Range   Glucose-Capillary  83  70 - 99 mg/dL   Comment 1 Notify RN     Mr Foot Left Wo Contrast  10/22/2013   CLINICAL DATA:  Soft tissue wound of the lateral aspect of the left foot.  EXAM: MRI OF THE LEFT FOREFOOT WITHOUT CONTRAST  TECHNIQUE: Multiplanar, multisequence MR imaging was performed. No intravenous contrast was administered.  COMPARISON:  Radiographs dated 10/21/2013  FINDINGS: The areas osteomyelitis of the head of the 4th metatarsal and of the base of the proximal phalangeal bone of the 4th toe with bone destruction as indicated on the radiographs. There is gas in the soft tissues dorsal to the head of 4th metatarsal. There is no effusion in the 4th metatarsal phalangeal joint.  There is also edema in the base of the 5th metatarsal  with osseous irregularity of the stump of the bone. This could be reactive due to the surgery done in October but given the adjacent osteomyelitis the possibly of osteomyelitis of the stump of the 5th metatarsal should be considered. There appears to be a soft tissue ulceration over that area as well.  There are no definable abscesses.  No other acute abnormality of the bones of the forefoot.  IMPRESSION: 1. Osteomyelitis of the head of the 4th metatarsal and of the base of the proximal phalanx of the 4th toe. 2. Osteomyelitis of the stump of the 5th metatarsal.   Electronically Signed   By: Rozetta Nunnery M.D.   On: 10/22/2013 15:15   Dg Foot 2 Views Left  10/21/2013   CLINICAL DATA:  Diabetic patient with a foot wound.  EXAM: LEFT FOOT - 2 VIEW  COMPARISON:  Plain films 07/22/2013.  FINDINGS: Since the prior study, the patient has undergone amputation of the 5th metatarsal at the level the proximal diaphysis. There is a large soft tissue wound on the lateral margin of the foot centered at the level of the head of the 4th metatarsal. There is bony destructive change in the head of the 4th metatarsal with loss of cortical bone extending into the proximal diaphysis. Bony destructive change is  also seen in the periphery of the base of the proximal phalanx of 4th toe. Locules of gas are seen in the soft tissues between the 3rd and 4th MTP joints. Vascular calcifications are noted.  IMPRESSION: Findings consistent with osteomyelitis extending from the distal diaphysis of the 4th metatarsal into the base of the proximal phalanx left 4th toe. Overlying large soft tissue wound is noted.  Locules of gas in the soft tissues between the 3rd and 4th MTP joints could be due to abscess.  Status post amputation of the 5th metatarsal at the level of the proximal diaphysis.   Electronically Signed   By: Inge Rise M.D.   On: 10/21/2013 16:24    Assessment/Plan PAD with non-healing wound of left foot.\ Just returned from MRI Discussed Aortoiliac arteriogram with LE runoff, including the possibility of angioplasty/stent if there is a treatable lesion. Explained procedure, risks, complications, use of sedation. Labs reviewed. NPO p MN Consent signed in chart  Ascencion Dike PA-C 10/22/2013, 3:20 PM

## 2013-10-22 NOTE — Consult Note (Signed)
Reason for Consult:infection left foot Referring Physician: hospitalists  Alexander [name] is an 52 y.o. male.  HPI: the patient is a 52 year old male who had an infection in the left foot treated proximally a month ago.  He was initially slow to heal and had an open wound over the lateral aspect of the foot.  He more recently developed a dark fourth toe and we are consult for evaluation.  X-rays apparently show osteomyelitis of the fourth metatarsal.  He is admitted to the hospital and we are consult for evaluation and treatment.  Past Medical History  Diagnosis Date  . Hypertension   . Dysrhythmia     h/o pvc's  . Arthritis   . Diabetes mellitus     dx---been  awhile    Past Surgical History  Procedure Laterality Date  . Lumbar laminectomy  06/13/2012    Procedure: MICRODISCECTOMY LUMBAR LAMINECTOMY;  Surgeon: Jessy Oto, MD;  Location: [place];  Service: Orthopedics;  Laterality: N/A;  Central laminectomy L2-3, L3-4, L4-5  . Amputation Left 08/19/2013    Procedure: AMPUTATION RAY;  Surgeon: Alta Corning, MD;  Location: [place];  Service: Orthopedics;  Laterality: Left;  ray amputation left 5th    History reviewed. No pertinent family history.  Social History:  reports that he has been smoking Cigarettes.  He has a 45 pack-year smoking history. He has never used smokeless tobacco. He reports that he drinks about 21.0 ounces of alcohol per week. He reports that he does not use illicit drugs.  Allergies:  Allergies  Allergen Reactions  . Sulfa Antibiotics Rash    Medications: I have reviewed the patient's current medications.  Results for orders placed during the hospital encounter of 10/21/13 (from the past 48 hour(s))  GLUCOSE, CAPILLARY     Status: Abnormal   Collection Time    10/21/13  2:54 PM      Result Value Range   Glucose-Capillary 140 (*) 70 - 99 mg/dL  CBC     Status: Abnormal   Collection Time    10/21/13  3:45 PM      Result Value Range   WBC 12.5 (*) 4.0 -  10.5 K/uL   RBC 3.33 (*) 4.22 - 5.81 MIL/uL   Hemoglobin 10.4 (*) 13.0 - 17.0 g/dL   HCT 28.5 (*) 39.0 - 52.0 %   MCV 85.6  78.0 - 100.0 fL   MCH 31.2  26.0 - 34.0 pg   MCHC 36.5 (*) 30.0 - 36.0 g/dL   RDW 11.5  11.5 - 15.5 %   Platelets 266  150 - 400 K/uL  BASIC METABOLIC PANEL     Status: Abnormal   Collection Time    10/21/13  3:45 PM      Result Value Range   Sodium 116 (*) 135 - 145 mEq/L   Comment: CRITICAL RESULT CALLED TO, READ BACK BY AND VERIFIED WITH:     QUICK,M AT 1725 ON 121014 BY POTEAT,S   Potassium 5.0  3.5 - 5.1 mEq/L   Chloride 86 (*) 96 - 112 mEq/L   CO2 19  19 - 32 mEq/L   Glucose, Bld 141 (*) 70 - 99 mg/dL   BUN 14  6 - 23 mg/dL   Creatinine, Ser 0.89  0.50 - 1.35 mg/dL   Calcium 8.6  8.4 - 10.5 mg/dL   GFR calc non Af Amer >90  >90 mL/min   GFR calc Af Amer >90  >90 mL/min   Comment: (NOTE)  The eGFR has been calculated using the CKD EPI equation.     This calculation has not been validated in all clinical situations.     eGFR's persistently <90 mL/min signify possible Chronic Kidney     Disease.  URINALYSIS, ROUTINE W REFLEX MICROSCOPIC     Status: Abnormal   Collection Time    10/21/13  5:23 PM      Result Value Range   Color, Urine AMBER (*) YELLOW   Comment: BIOCHEMICALS MAY BE AFFECTED BY COLOR   APPearance CLOUDY (*) CLEAR   Specific Gravity, Urine 1.025  1.005 - 1.030   pH 5.5  5.0 - 8.0   Glucose, UA 100 (*) NEGATIVE mg/dL   Hgb urine dipstick LARGE (*) NEGATIVE   Bilirubin Urine MODERATE (*) NEGATIVE   Ketones, ur 40 (*) NEGATIVE mg/dL   Protein, ur >300 (*) NEGATIVE mg/dL   Urobilinogen, UA 1.0  0.0 - 1.0 mg/dL   Nitrite NEGATIVE  NEGATIVE   Leukocytes, UA NEGATIVE  NEGATIVE  SODIUM, URINE, RANDOM     Status: None   Collection Time    10/21/13  5:23 PM      Result Value Range   Sodium, Ur 14     Comment: Performed at [place], URINE     Status: None   Collection Time    10/21/13  5:23 PM      Result  Value Range   Osmolality, Ur 469  390 - 1090 mOsm/kg   Comment: Performed at [place] [facility]     Status: Abnormal   Collection Time    10/21/13  5:23 PM      Result Value Range   WBC, UA 0-2  <3 WBC/hpf   Bacteria, UA MANY (*) RARE   Urine-Other MUCOUS PRESENT    COMPREHENSIVE METABOLIC PANEL     Status: Abnormal   Collection Time    10/21/13  8:07 PM      Result Value Range   Sodium 119 (*) 135 - 145 mEq/L   Comment: CRITICAL RESULT CALLED TO, READ BACK BY AND VERIFIED WITH:     BROWN,S RN @2105  ON 12.10.2014 BY MCREYNOLDS,B   Potassium 4.5  3.5 - 5.1 mEq/L   Chloride 88 (*) 96 - 112 mEq/L   CO2 22  19 - 32 mEq/L   Glucose, Bld 123 (*) 70 - 99 mg/dL   BUN 15  6 - 23 mg/dL   Creatinine, Ser 0.93  0.50 - 1.35 mg/dL   Calcium 8.5  8.4 - 10.5 mg/dL   Total Protein 6.0  6.0 - 8.3 g/dL   Albumin 2.4 (*) 3.5 - 5.2 g/dL   AST 20  0 - 37 U/L   ALT 26  0 - 53 U/L   Alkaline Phosphatase 80  39 - 117 U/L   Total Bilirubin 0.5  0.3 - 1.2 mg/dL   GFR calc non Af Amer >90  >90 mL/min   GFR calc Af Amer >90  >90 mL/min   Comment: (NOTE)     The eGFR has been calculated using the CKD EPI equation.     This calculation has not been validated in all clinical situations.     eGFR's persistently <90 mL/min signify possible Chronic Kidney     Disease.  MAGNESIUM     Status: None   Collection Time    10/21/13  8:07 PM      Result Value Range   Magnesium  2.1  1.5 - 2.5 mg/dL  PHOSPHORUS     Status: None   Collection Time    10/21/13  8:07 PM      Result Value Range   Phosphorus 4.5  2.3 - 4.6 mg/dL  CBC WITH DIFFERENTIAL     Status: Abnormal   Collection Time    10/21/13  8:07 PM      Result Value Range   WBC 12.9 (*) 4.0 - 10.5 K/uL   RBC 3.24 (*) 4.22 - 5.81 MIL/uL   Hemoglobin 10.2 (*) 13.0 - 17.0 g/dL   HCT 27.6 (*) 39.0 - 52.0 %   MCV 85.2  78.0 - 100.0 fL   MCH 31.5  26.0 - 34.0 pg   MCHC 37.0 (*) 30.0 - 36.0 g/dL   RDW 11.5  11.5 - 15.5 %    Platelets 275  150 - 400 K/uL   Neutrophils Relative % 80 (*) 43 - 77 %   Lymphocytes Relative 11 (*) 12 - 46 %   Monocytes Relative 9  3 - 12 %   Eosinophils Relative 0  0 - 5 %   Basophils Relative 0  0 - 1 %   Neutro Abs 10.3 (*) 1.7 - 7.7 K/uL   Lymphs Abs 1.4  0.7 - 4.0 K/uL   Monocytes Absolute 1.2 (*) 0.1 - 1.0 K/uL   Eosinophils Absolute 0.0  0.0 - 0.7 K/uL   Basophils Absolute 0.0  0.0 - 0.1 K/uL   WBC Morphology TOXIC GRANULATION    APTT     Status: Abnormal   Collection Time    10/21/13  8:07 PM      Result Value Range   aPTT 39 (*) 24 - 37 seconds   Comment:            IF BASELINE aPTT IS ELEVATED,     SUGGEST PATIENT RISK ASSESSMENT     BE USED TO DETERMINE APPROPRIATE     ANTICOAGULANT THERAPY.  PROTIME-INR     Status: Abnormal   Collection Time    10/21/13  8:07 PM      Result Value Range   Prothrombin Time 15.4 (*) 11.6 - 15.2 seconds   INR 1.25  0.00 - 1.49  TSH     Status: None   Collection Time    10/21/13  8:07 PM      Result Value Range   TSH 2.048  0.350 - 4.500 uIU/mL   Comment: Performed at [facility]  CBC     Status: Abnormal   Collection Time    10/22/13  5:25 AM      Result Value Range   WBC 10.8 (*) 4.0 - 10.5 K/uL   RBC 3.25 (*) 4.22 - 5.81 MIL/uL   Hemoglobin 10.3 (*) 13.0 - 17.0 g/dL   HCT 27.9 (*) 39.0 - 52.0 %   MCV 85.8  78.0 - 100.0 fL   MCH 31.7  26.0 - 34.0 pg   MCHC 36.9 (*) 30.0 - 36.0 g/dL   RDW 11.6  11.5 - 15.5 %   Platelets 281  150 - 400 K/uL  BASIC METABOLIC PANEL     Status: Abnormal   Collection Time    10/22/13  5:25 AM      Result Value Range   Sodium 122 (*) 135 - 145 mEq/L   Potassium 4.4  3.5 - 5.1 mEq/L   Chloride 89 (*) 96 - 112 mEq/L   CO2 20  19 -  32 mEq/L   Glucose, Bld 78  70 - 99 mg/dL   BUN 17  6 - 23 mg/dL   Creatinine, Ser 1.19  0.50 - 1.35 mg/dL   Calcium 8.4  8.4 - 10.5 mg/dL   GFR calc non Af Amer 68 (*) >90 mL/min   GFR calc Af Amer 78 (*) >90 mL/min   Comment: (NOTE)     The eGFR  has been calculated using the CKD EPI equation.     This calculation has not been validated in all clinical situations.     eGFR's persistently <90 mL/min signify possible Chronic Kidney     Disease.    Dg Foot 2 Views Left  10/21/2013   CLINICAL DATA:  Diabetic patient with a foot wound.  EXAM: LEFT FOOT - 2 VIEW  COMPARISON:  Plain films 07/22/2013.  FINDINGS: Since the prior study, the patient has undergone amputation of the 5th metatarsal at the level the proximal diaphysis. There is a large soft tissue wound on the lateral margin of the foot centered at the level of the head of the 4th metatarsal. There is bony destructive change in the head of the 4th metatarsal with loss of cortical bone extending into the proximal diaphysis. Bony destructive change is also seen in the periphery of the base of the proximal phalanx of 4th toe. Locules of gas are seen in the soft tissues between the 3rd and 4th MTP joints. Vascular calcifications are noted.  IMPRESSION: Findings consistent with osteomyelitis extending from the distal diaphysis of the 4th metatarsal into the base of the proximal phalanx left 4th toe. Overlying large soft tissue wound is noted.  Locules of gas in the soft tissues between the 3rd and 4th MTP joints could be due to abscess.  Status post amputation of the 5th metatarsal at the level of the proximal diaphysis.   Electronically Signed   By: Inge Rise M.D.   On: 10/21/2013 16:24    ROS ROS: I have reviewed the patient's review of systems thoroughly and there are no positive responses as relates to the HPI. EXAM Blood pressure 143/76, pulse 73, temperature 98.2 F (36.8 C), temperature source Oral, resp. rate 18, height 6' (1.829 m), weight 108.863 kg (240 lb), SpO2 97.00%. Physical Exam Well-developed well-nourished patient in no acute distress. Alert and oriented x3 HEENT:within normal limits Cardiac: Regular rate and rhythm Pulmonary: Lungs clear to auscultation Abdomen:  Soft and nontender.  Normal active bowel sounds  Musculoskeletal: erythema over the dorsum of the left foot.  The fourth toe is dark and dusky today.  There's foul-smelling drainage and odor from the open wound on the lateral aspect of the foot.  There is difficult to Doppler pulses in the foot. Assessment/Plan: 52 year old male who is 1 month status post amputation of fifth ray  Who now presents with dusky fourth toe and open and draining foul-smelling wound.  Plain x-rays show possibly myelitis of the fourth metatarsal.  The patient had ABIs a proximally 6 months ago and these were normal.  At this point I think we need to reevaluate the entire situation.  I will redo ABIs, discussed the case with vascular, repeat MRI of the foot, and proceed with treatment based on the findings of this workup.  The patient states that he's recently stopped smoking in his blood sugars have been under reasonable control.  Ayano Douthitt L 10/22/2013, 8:27 AM

## 2013-10-22 NOTE — Progress Notes (Signed)
VASCULAR LAB PRELIMINARY  ARTERIAL  ABI completed:    RIGHT    LEFT    PRESSURE WAVEFORM  PRESSURE WAVEFORM  BRACHIAL 157 Triphasic BRACHIAL 157 Triphasic  DP 144 Biphasic DP 90 Dampened monophasic  AT   AT    PT 163 Biphasic PT 82 Monophasic  PER   PER    GREAT TOE 114 NA GREAT TOE 49 NA    RIGHT LEFT  ABI 1.04 0.57    The right ABI is within normal limits. The left ABI is suggestive of moderate arterial insufficiency. The right great toe pressure is within normal limits. Based on the absolute left great toe pressure of 47mmHg, there is adequate healing potential from a vascular standpoint.  Compared with the prior study from 04/2013, there appears to be progression of disease in the left lower extremity. Prior study results are available in [facility].  Preliminary results discussed with Dr.Vega and Dr.Graves.  10/22/2013 10:09 AM Maudry Mayhew, RVT, RDCS, RDMS

## 2013-10-22 NOTE — Progress Notes (Cosign Needed)
[facility]  NAME:  DEREON, RYEL.:  [id number]  MEDICAL RECORD NO.:  [id number]      DATE OF BIRTH:  05/17/1961  PHYSICIAN:  Judene Companion, M.D.      VISIT DATE:  10/21/2013                                  OFFICE VISIT   Dear Alexander [name] is a patient of mine at [facility] in [place], [place].  He has many different specialists taking care of him including an endocrinologist, a cardiologist, a orthopedist and myself who works in the [facility] at [employer].  This gentleman is 52 year old, and he came to me today with a very large ulcer which goes in to his left 5th MP joint and what looks like on x-ray is an osteomyelitis involving the proximal phalanx and the 5th metatarsal.  I am sending this man in to the hospital for amputation as I do not believe that there is any way this is going to heal.  He may even need a transmetatarsal amputation.  He also has episodes of ventricular tachycardia.  He has hypertension.  He has an enlarged left ventricle. He has had lumbar surgery in the past.  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  He would like to get back to work, but I am not sure he is ever going to be able to.  At least I would like to apply for short-term disability so that he would be able to pay his bills, his mortgage, and his general living expenses as well as perhaps some help in his hospital bills.  Thank you very much.     Judene Companion, M.D.     PP/MEDQ  D:  10/21/2013  T:  10/22/2013  Job:  [id number]

## 2013-10-22 NOTE — Progress Notes (Signed)
[facility] PROGRESS NOTE  BUFF STILWELL [id number] DOB: [birth year] DOA: 10/21/2013 PCP: No primary provider on file.  Assessment/Plan: Osteomyelitis of left foot  - Orthopedic surgeon and vascular surgeon on board. Workup currently underway for management recommendations - for now continue vanco and zosyn  - ABI and MRI of the left foot obtained today  Active Problems:  DIABETES MELLITUS, TYPE II  - may continue amaryl and januvia -  I do not see reports of prior hemoglobin A1c review of medical reported value of 10.1 was listed which would indicate poor control of diabetes. This could also be contributing to #1  HYPERTENSION  - continue lisinopril and metoprolol  - order placed for hydralazine 10 mg every 6 hours IV PRN for better BP control  - If blood pressure remains elevated we'll plan on adjusting home blood pressure regimen  Hyponatremia  - Most likely due to poor oral solid intake - Improved with IV fluids as such will continue at this juncture  Leukocytosis  - secondary to left foot osteomyelitis  - continue to monitor WBC count -  Continue IV antibiotics vancomycin and Zosyn  Anemia  - hemoglobin 10.4 initially, no active bleeding currently stable   Code Status: full Family Communication: Discussed at length with family (wife and daughters)  Disposition Plan: Pending further evaluation and recommendations from specialist involved.   Consultants:  Orthopaedic surgery  Vascular surgery  Procedures:  ABI  MRI of left foot  Arteriogram Pending most likely 10/23/13  Antibiotics:  Vancomycin and zosyn  HPI/Subjective: No new complaints. No acute issues reported overnight  Objective: Filed Vitals:   10/22/13 1552  BP: 171/91  Pulse: 75  Temp: 98.5 F (36.9 C)  Resp: 18    Intake/Output Summary (Last 24 hours) at 10/22/13 1809 Last data filed at 10/22/13 1632  Gross per 24 hour  Intake 1507.5 ml  Output      0 ml  Net 1507.5 ml    Filed Weights   10/21/13 1500  Weight: 108.863 kg (240 lb)    Exam:   General:  Pt in NAD, Alert and Awake  Cardiovascular: RRR, no MRG  Respiratory: CTA BL, no wheezes  Abdomen: soft, NT, ND  Musculoskeletal: no cyanosis or clubbing   Data Reviewed: Basic Metabolic Panel:  Recent Labs Lab 10/21/13 1545 10/21/13 2007 10/22/13 0525  NA 116* 119* 122*  K 5.0 4.5 4.4  CL 86* 88* 89*  CO2 19 22 20   GLUCOSE 141* 123* 78  BUN 14 15 17   CREATININE 0.89 0.93 1.19  CALCIUM 8.6 8.5 8.4  MG  --  2.1  --   PHOS  --  4.5  --    Liver Function Tests:  Recent Labs Lab 10/21/13 2007  AST 20  ALT 26  ALKPHOS 80  BILITOT 0.5  PROT 6.0  ALBUMIN 2.4*   No results found for this basename: LIPASE, AMYLASE,  in the last 168 hours No results found for this basename: AMMONIA,  in the last 168 hours CBC:  Recent Labs Lab 10/21/13 1545 10/21/13 2007 10/22/13 0525  WBC 12.5* 12.9* 10.8*  NEUTROABS  --  10.3*  --   HGB 10.4* 10.2* 10.3*  HCT 28.5* 27.6* 27.9*  MCV 85.6 85.2 85.8  PLT 266 275 281   Cardiac Enzymes: No results found for this basename: CKTOTAL, CKMB, CKMBINDEX, TROPONINI,  in the last 168 hours BNP (last 3 results) No results found for this basename: PROBNP,  in the last 8760  hours CBG:  Recent Labs Lab 10/21/13 1454 10/22/13 1043  GLUCAP 140* 83    No results found for this or any previous visit (from the past 240 hour(s)).   Studies: Mr Foot Left Wo Contrast  10/22/2013   CLINICAL DATA:  Soft tissue wound of the lateral aspect of the left foot.  EXAM: MRI OF THE LEFT FOREFOOT WITHOUT CONTRAST  TECHNIQUE: Multiplanar, multisequence MR imaging was performed. No intravenous contrast was administered.  COMPARISON:  Radiographs dated 10/21/2013  FINDINGS: The areas osteomyelitis of the head of the 4th metatarsal and of the base of the proximal phalangeal bone of the 4th toe with bone destruction as indicated on the radiographs. There is gas in the  soft tissues dorsal to the head of 4th metatarsal. There is no effusion in the 4th metatarsal phalangeal joint.  There is also edema in the base of the 5th metatarsal with osseous irregularity of the stump of the bone. This could be reactive due to the surgery done in October but given the adjacent osteomyelitis the possibly of osteomyelitis of the stump of the 5th metatarsal should be considered. There appears to be a soft tissue ulceration over that area as well.  There are no definable abscesses.  No other acute abnormality of the bones of the forefoot.  IMPRESSION: 1. Osteomyelitis of the head of the 4th metatarsal and of the base of the proximal phalanx of the 4th toe. 2. Osteomyelitis of the stump of the 5th metatarsal.   Electronically Signed   By: Rozetta Nunnery M.D.   On: 10/22/2013 15:15   Dg Foot 2 Views Left  10/21/2013   CLINICAL DATA:  Diabetic patient with a foot wound.  EXAM: LEFT FOOT - 2 VIEW  COMPARISON:  Plain films 07/22/2013.  FINDINGS: Since the prior study, the patient has undergone amputation of the 5th metatarsal at the level the proximal diaphysis. There is a large soft tissue wound on the lateral margin of the foot centered at the level of the head of the 4th metatarsal. There is bony destructive change in the head of the 4th metatarsal with loss of cortical bone extending into the proximal diaphysis. Bony destructive change is also seen in the periphery of the base of the proximal phalanx of 4th toe. Locules of gas are seen in the soft tissues between the 3rd and 4th MTP joints. Vascular calcifications are noted.  IMPRESSION: Findings consistent with osteomyelitis extending from the distal diaphysis of the 4th metatarsal into the base of the proximal phalanx left 4th toe. Overlying large soft tissue wound is noted.  Locules of gas in the soft tissues between the 3rd and 4th MTP joints could be due to abscess.  Status post amputation of the 5th metatarsal at the level of the proximal  diaphysis.   Electronically Signed   By: Inge Rise M.D.   On: 10/21/2013 16:24    Scheduled Meds: . aspirin EC  81 mg Oral Daily  . glimepiride  4 mg Oral QAC breakfast  . linagliptin  5 mg Oral Q supper   And  . metFORMIN  2,000 mg Oral Q supper  . lisinopril  20 mg Oral BID  . metoprolol succinate  100 mg Oral Daily  . multivitamin with minerals  1 tablet Oral Daily  . piperacillin-tazobactam (ZOSYN)  IV  3.375 g Intravenous Q8H  . sodium chloride  3 mL Intravenous Q12H  . vancomycin  1,000 mg Intravenous Q12H   Continuous Infusions: . sodium  chloride 75 mL/hr at 10/22/13 1040    Principal Problem:   Osteomyelitis of left foot Active Problems:   DIABETES MELLITUS, TYPE II   HYPERTENSION   Leukocytosis   Anemia    Time spent: > 35 minutes    Velvet Bathe  [facility] Pager [phone] If 7PM-7AM, please contact night-coverage at [email or web address], password [facility] 10/22/2013, 6:09 PM  LOS: 1 day

## 2013-10-22 NOTE — Consult Note (Signed)
[facility]  Patient name: Alexander [name] MRN: [id number] DOB: 08-15-1961 Sex: male  REASON FOR CONSULT: Nonhealing wound of the left foot. Consult from Dr. Dorna Leitz.  HPI: Alexander [name] is a 52 y.o. male who developed a wound on the lateral aspect of his left foot in April of this year. He was treated at the wound care center and also had 2 months of hyperbaric treatments. Ultimately, he underwent an amputation of the left fifth toe in October of this year for osteomyelitis. He was admitted 2 days ago with discoloration of the left fourth toe and wounds on the lateral aspect of his left foot. He is currently being treated with intravenous vancomycin and Zosyn.  Prior to developing the wound on his left fifth toe, he denied any history of claudication, rest pain, or nonhealing ulcers. He is not aware of any injury to the left foot.  His risk factors for peripheral vascular disease include diabetes, hypertension, and history of tobacco use. He has smoked 1-1/2 packs per day of cigarettes for 38 years. He denies any history of hypercholesterolemia or history of premature cardiovascular disease.  Past Medical History  Diagnosis Date  . Hypertension   . Dysrhythmia     h/o pvc's  . Arthritis   . Diabetes mellitus     dx---been  awhile   FAMILY HISTORY: He denies any history of premature cardiovascular disease.  SOCIAL HISTORY: History  Substance Use Topics  . Smoking status: Current Every Day Smoker -- 1.50 packs/day for 30 years    Types: Cigarettes  . Smokeless tobacco: Never Used  . Alcohol Use: 21.0 oz/week    35 Cans of beer per week     Comment: 10/21/13 - normallly "a bunch" none since 10/18/13   Allergies  Allergen Reactions  . Sulfa Antibiotics Rash   Current Facility-Administered Medications  Medication Dose Route Frequency Provider Last Rate Last Dose  . 0.9 %  sodium chloride infusion   Intravenous Continuous Robbie Lis, MD 75 mL/hr  at 10/22/13 1040    . acetaminophen (TYLENOL) tablet 650 mg  650 mg Oral Q6H PRN Robbie Lis, MD       Or  . acetaminophen (TYLENOL) suppository 650 mg  650 mg Rectal Q6H PRN Robbie Lis, MD      . aspirin EC tablet 81 mg  81 mg Oral Daily Robbie Lis, MD   81 mg at 10/22/13 1038  . glimepiride (AMARYL) tablet 4 mg  4 mg Oral QAC breakfast Robbie Lis, MD   4 mg at 10/22/13 1038  . hydrALAZINE (APRESOLINE) injection 10 mg  10 mg Intravenous Q6H PRN Robbie Lis, MD      . HYDROcodone-acetaminophen (NORCO/VICODIN) 5-325 MG per tablet 1-2 tablet  1-2 tablet Oral Q4H PRN Robbie Lis, MD   2 tablet at 10/22/13 0531  . linagliptin (TRADJENTA) tablet 5 mg  5 mg Oral Q supper Robbie Lis, MD       And  . metFORMIN (GLUCOPHAGE) tablet 2,000 mg  2,000 mg Oral Q supper Robbie Lis, MD      . lisinopril (PRINIVIL,ZESTRIL) tablet 20 mg  20 mg Oral BID Robbie Lis, MD   20 mg at 10/22/13 1038  . metoprolol succinate (TOPROL-XL) 24 hr tablet 100 mg  100 mg Oral Daily Robbie Lis, MD   100 mg at 10/22/13 1038  . multivitamin with minerals tablet 1 tablet  1 tablet Oral Daily Robbie Lis, MD   1 tablet at 10/22/13 1038  . piperacillin-tazobactam (ZOSYN) IVPB 3.375 g  3.375 g Intravenous Q8H Colleen E Summe, RPH   3.375 g at 10/22/13 1037  . sodium chloride 0.9 % injection 3 mL  3 mL Intravenous Q12H Robbie Lis, MD      . vancomycin (VANCOCIN) IVPB 1000 mg/200 mL premix  1,000 mg Intravenous Q12H Octavio Graves Summe, RPH   1,000 mg at 10/22/13 0531   REVIEW OF SYSTEMS: Valu.Nieves ] denotes positive finding; [  ] denotes negative finding CARDIOVASCULAR:  [ ]  chest pain   [ ]  chest pressure   [ ]  palpitations   [ ]  orthopnea Valu.Nieves ] dyspnea on exertion   [ ]  claudication   [ ]  rest pain   [ ]  DVT   [ ]  phlebitis PULMONARY:   [ ]  productive cough   [ ]  asthma   [ ]  wheezing NEUROLOGIC:   [ ]  weakness  [ ]  paresthesias  [ ]  aphasia  [ ]  amaurosis  [ ]  dizziness HEMATOLOGIC:   [ ]  bleeding problems   [ ]   clotting disorders MUSCULOSKELETAL:  Valu.Nieves ] joint pain   [ ]  joint swelling [ ]  leg swelling GASTROINTESTINAL: [ ]   blood in stool  [ ]   hematemesis GENITOURINARY:  [ ]   dysuria  [ ]   hematuria PSYCHIATRIC:  [ ]  history of major depression INTEGUMENTARY:  [ ]  rashes  Valu.Nieves ] ulcers CONSTITUTIONAL:  [ ]  fever   [ ]  chills  PHYSICAL EXAM: Filed Vitals:   10/21/13 1500 10/21/13 1848 10/21/13 2105 10/22/13 0542  BP:  179/79 152/67 143/76  Pulse:  89 85 73  Temp:  98.7 F (37.1 C) 98.8 F (37.1 C) 98.2 F (36.8 C)  TempSrc:  Oral Oral Oral  Resp:  18 20 18   Height: 6' (1.829 m)     Weight: 240 lb (108.863 kg)     SpO2:  98% 98% 97%   Body mass index is 32.54 kg/(m^2). GENERAL: The patient is a well-nourished male, in no acute distress. The vital signs are documented above. CARDIOVASCULAR: There is a regular rate and rhythm. I did not detect carotid bruits. He has palpable radial pulses. On the left side, which is the symptomatic side, he has a palpable femoral pulse. I cannot palpate popliteal, dorsalis pedis, or posterior tibial pulses. On the right side, he has a palpable femoral pulse and a palpable dorsalis pedis pulse. Cannot palpate a posterior tibial pulse. He has mild left lower extremity swelling. PULMONARY: There is good air exchange bilaterally without wheezing or rales. ABDOMEN: Soft and non-tender with normal pitched bowel sounds. He is obese and it is difficult to assess for an aneurysm. MUSCULOSKELETAL: he has undergone previous left fifth toe amputation. The fourth toe is cyanotic. NEUROLOGIC: No focal weakness or paresthesias are detected. SKIN: He has extensive wounds in the lateral aspect of his left foot. There is a black eschar which appears to be fairly deep along the lateral aspect of the left foot it measures approximately 1.5 x 1 cm in diameter. Toe amputation site also has an open wound with black necrotic tissue measuring approximately 2 x 3 cm. There is erythema on the  foot with swelling in the foot. There is no significant drainage. PSYCHIATRIC: The patient has a normal affect.  DATA:  Lab Results  Component Value Date   WBC 10.8* 10/22/2013   HGB 10.3* 10/22/2013  HCT 27.9* 10/22/2013   MCV 85.8 10/22/2013   PLT 281 10/22/2013   Lab Results  Component Value Date   NA 122* 10/22/2013   K 4.4 10/22/2013   CL 89* 10/22/2013   CO2 20 10/22/2013   Lab Results  Component Value Date   CREATININE 1.19 10/22/2013   Lab Results  Component Value Date   INR 1.25 10/21/2013   Lab Results  Component Value Date   HGBA1C 10.1* 03/15/2009   CBG (last 3)   Recent Labs  10/21/13 1454 10/22/13 1043  GLUCAP 140* 83   I have independently interpreted his arterial Doppler study which shows an ABI of 100% on the right and 57% on the left. On the left side, there is a dampened monophasic dorsalis pedis signal with the Doppler and a monophasic posterior tibial signal. On the right side, there is a biphasic dorsalis pedis signal and a biphasic posterior tibial signal. Great toe pressure on the right is 114 mmHg. Great toe pressure on the left is 49 mmHg.  X-ray of the left foot shows findings consistent with osteomyelitis extending from the distal fourth metatarsal to the base of the proximal phalanx. There is a loculated gas between the third and fourth toes.  MEDICAL ISSUES:  PERIPHERAL VASCULAR DISEASE WITH GANGRENOUS WOUND OF THE LEFT FOOT: Based on his exam, he has evidence of infrainguinal arterial occlusive disease on the left. He has extensive wounds on the lateral aspect of the left foot and with his history of diabetes is at high risk for limb loss.  The toe pressure of 49 mm mercury on the left I do not think that he has adequate circulation to heal the wounds on the left foot. I have recommended an arteriogram and have discussed this with interventional radiology to confirm that test here at [facility]. If he has disease amenable to angioplasty,  this could potentially be addressed at the same time. If he would require a bypass, he would have to be transferred to [place] and could potentially undergo bypass on Tuesday of next week. Even with revascularization I think that he is at significant risk for limb loss given the extent of the wound. He may require more extensive amputation of the left foot. We have discussed the importance of tobacco cessation. I've explained to him that this is clearly a limb threatening problem. He is on aspirin. Agree with continuing IV vancomycin and Zosyn for now. Of note his hemoglobin A1c is 10, which would suggest that his diabetes is not under control.  [facility] [place]: [phone]

## 2013-10-23 ENCOUNTER — Inpatient Hospital Stay (HOSPITAL_COMMUNITY): Payer: Medicaid Other

## 2013-10-23 LAB — GLUCOSE, CAPILLARY
Glucose-Capillary: 119 mg/dL — ABNORMAL HIGH (ref 70–99)
Glucose-Capillary: 145 mg/dL — ABNORMAL HIGH (ref 70–99)

## 2013-10-23 LAB — URINE CULTURE
Colony Count: NO GROWTH
Culture: NO GROWTH

## 2013-10-23 MED ORDER — FENTANYL CITRATE 0.05 MG/ML IJ SOLN
INTRAMUSCULAR | Status: AC
  Filled 2013-10-23: qty 6

## 2013-10-23 MED ORDER — MIDAZOLAM HCL 2 MG/2ML IJ SOLN
INTRAMUSCULAR | Status: AC
  Filled 2013-10-23: qty 4

## 2013-10-23 MED ORDER — FENTANYL CITRATE 0.05 MG/ML IJ SOLN
INTRAMUSCULAR | Status: AC
  Filled 2013-10-23: qty 4

## 2013-10-23 MED ORDER — IODIXANOL 320 MG/ML IV SOLN
100.0000 mL | Freq: Once | INTRAVENOUS | Status: AC | PRN
  Administered 2013-10-23: 195 mL via INTRA_ARTERIAL

## 2013-10-23 MED ORDER — MIDAZOLAM HCL 2 MG/2ML IJ SOLN
INTRAMUSCULAR | Status: AC
  Filled 2013-10-23: qty 6

## 2013-10-23 MED ORDER — FENTANYL CITRATE 0.05 MG/ML IJ SOLN
INTRAMUSCULAR | Status: AC | PRN
  Administered 2013-10-23 (×2): 100 ug via INTRAVENOUS

## 2013-10-23 MED ORDER — LIDOCAINE HCL 1 % IJ SOLN
INTRAMUSCULAR | Status: AC
  Filled 2013-10-23: qty 20

## 2013-10-23 MED ORDER — MIDAZOLAM HCL 2 MG/2ML IJ SOLN
INTRAMUSCULAR | Status: AC | PRN
  Administered 2013-10-23 (×2): 2 mg via INTRAVENOUS

## 2013-10-23 NOTE — Progress Notes (Signed)
Patient ID: Alexander [name], male   DOB: October 09, 1961, 52 y.o.   MRN: [id number]  i have reviewed resulte of arteriorgram and will await vascukar input but it would appear some improvement of inflow possible.  I will plan on doing something Monday if the decision is not to surgically improve inflow and will allow the vascular service to proceed with amputation if they plan revasc. Misty Rago

## 2013-10-23 NOTE — Progress Notes (Signed)
VASCULAR SURGERY  For arteriogram today to evaluate L LE for revascularization. If he is not a candidate for an endovascular approach, and if he needs a bypass, then I could proceed with bypass on Tuesday. He would need to be transferred to [facility] for this. I will check back on Monday. If any problems over the weekend, Dr. Bridgett Larsson is on call [phone]).   [name], MD, [place] [phone] 10/23/2013

## 2013-10-23 NOTE — Procedures (Signed)
Procedure:  Abdominal aortogram with bilateral lower extremity run-off Findings:  Abdominal aorta widely patent. Left Lower Extremity:  Smoothly tapered 50% stenosis of common iliac artery.  Mild SFA disease.  Critical/subtotal occlusion of proximal left popliteal artery just below adductor hiatus.  40-50% stenosis mid popliteal.  Posterior tibial artery patent into foot.  Anterior tibial artery diseased in distal calf, but open into foot.  Peroneal artery occluded in proximal calf. Right Lower Extremity:  No significant inflow disease.  40-50% distal SFA stenosis.  50% mid popliteal stenosis.  Open dominant 2 vessel AT and PT runoff.  Peroneal small but open.

## 2013-10-23 NOTE — Progress Notes (Signed)
Rx Brief Antibiotic note:  IV Vancomycin  Assessement:  VT=16.6 mg/L after 1Gm q12h @ [facility]  Scr with slight bump 0.89>0.93>1.19, CrCl~72 (N)  Plan:  At goal of 15-20 mg/L for osteomyelitis  No change  F/U SCr/additional levels as needed  Dorrene German 10/23/2013 5:00 PM

## 2013-10-23 NOTE — Progress Notes (Signed)
[facility] PROGRESS NOTE  Alexander [name] [id number] DOB: [birth year] DOA: 10/21/2013 PCP: No primary provider on file.  Assessment/Plan: Osteomyelitis of left foot  - Orthopedic surgeon and vascular surgeon on board. Workup currently underway for management recommendations - for now continue vanco and zosyn  - ABI and MRI of the left foot obtained - Arteriogram ordered for further evaluation and recommendations.  Active Problems:  DIABETES MELLITUS, TYPE II  - may continue amaryl and januvia - Blood sugars relatively well controlled currently  HYPERTENSION  - continue lisinopril and metoprolol  - order placed for hydralazine 10 mg every 6 hours IV PRN for better BP control  - Given possibility of vascular supply to lower extremity we'll not be overly aggressive in treating elevated blood pressure  Hyponatremia  - Most likely due to poor oral solid intake - Improved with IV fluids as such will continue at this juncture  Leukocytosis  - secondary to left foot osteomyelitis  - continue to monitor WBC count -  Continue IV antibiotics vancomycin and Zosyn  Anemia  - hemoglobin 10.4 initially, no active bleeding currently stable  Code Status: full Family Communication: Discussed at length with family (wife and daughters)  Disposition Plan: Pending further evaluation and recommendations from specialist involved.   Consultants:  Orthopaedic surgery  Vascular surgery  Procedures:  ABI  MRI of left foot  Arteriogram Pending most likely 10/23/13  Antibiotics:  Vancomycin and zosyn  HPI/Subjective: No new complaints. No acute issues reported overnight  Objective: Filed Vitals:   10/23/13 1510  BP: 157/83  Pulse: 71  Temp: 98.3 F (36.8 C)  Resp: 18    Intake/Output Summary (Last 24 hours) at 10/23/13 1831 Last data filed at 10/22/13 2146  Gross per 24 hour  Intake      0 ml  Output    800 ml  Net   -800 ml   Filed Weights   10/21/13 1500  10/22/13 0500  Weight: 108.863 kg (240 lb) 108.863 kg (240 lb)    Exam:   General:  Pt in NAD, Alert and Awake  Cardiovascular: RRR, no MRG  Respiratory: CTA BL, no wheezes  Abdomen: soft, NT, ND  Musculoskeletal: no cyanosis or clubbing. Patient has ulcer at left foot  Data Reviewed: Basic Metabolic Panel:  Recent Labs Lab 10/21/13 1545 10/21/13 2007 10/22/13 0525  NA 116* 119* 122*  K 5.0 4.5 4.4  CL 86* 88* 89*  CO2 19 22 20   GLUCOSE 141* 123* 78  BUN 14 15 17   CREATININE 0.89 0.93 1.19  CALCIUM 8.6 8.5 8.4  MG  --  2.1  --   PHOS  --  4.5  --    Liver Function Tests:  Recent Labs Lab 10/21/13 2007  AST 20  ALT 26  ALKPHOS 80  BILITOT 0.5  PROT 6.0  ALBUMIN 2.4*   No results found for this basename: LIPASE, AMYLASE,  in the last 168 hours No results found for this basename: AMMONIA,  in the last 168 hours CBC:  Recent Labs Lab 10/21/13 1545 10/21/13 2007 10/22/13 0525  WBC 12.5* 12.9* 10.8*  NEUTROABS  --  10.3*  --   HGB 10.4* 10.2* 10.3*  HCT 28.5* 27.6* 27.9*  MCV 85.6 85.2 85.8  PLT 266 275 281   Cardiac Enzymes: No results found for this basename: CKTOTAL, CKMB, CKMBINDEX, TROPONINI,  in the last 168 hours BNP (last 3 results) No results found for this basename: PROBNP,  in the last 8760  hours CBG:  Recent Labs Lab 10/21/13 1454 10/22/13 1043 10/23/13 0741  GLUCAP 140* 83 119*    Recent Results (from the past 240 hour(s))  URINE CULTURE     Status: None   Collection Time    10/22/13  3:10 PM      Result Value Range Status   Specimen Description URINE, RANDOM   Final   Special Requests NONE   Final   Culture  Setup Time     Final   Value: 10/22/2013 21:54     Performed at [place]     Final   Value: NO GROWTH     Performed at [facility]   Culture     Final   Value: NO GROWTH     Performed at [facility]   Report Status 10/23/2013 FINAL   Final     Studies: Ir  Aortagram Abdominal Serialogram  10/23/2013   CLINICAL DATA:  Left foot gangrene and osteomyelitis. Nonhealing of wound with clinical evidence of peripheral vascular disease.  EXAM: 1. ULTRASOUND GUIDANCE FOR VASCULAR ACCESS OF THE RIGHT COMMON FEMORAL ARTERY 2. ABDOMINAL AORTOGRAPHY 3. BILATERAL LOWER EXTREMITY ARTERIOGRAPHY  MEDICATIONS: Sedation:  4 mg IV Versed, 200 mcg IV fentanyl.  Total Moderate Sedation Time:  40 minutes.  CONTRAST:  177mL VISIPAQUE IODIXANOL 320 MG/ML IV SOLN  FLUOROSCOPY TIME:  6 min and 30 seconds.  PROCEDURE: The right groin was sterilely prepped with Betadine and draped. Local anesthesia was provided with 1% lidocaine. Ultrasound was used to confirm patency of the right common femoral artery.  The right common femoral artery was accessed utilizing a micropuncture set and ultrasound guidance. A guidewire was advanced into the abdominal aorta. A 5 French vascular sheath was placed. A 5 French pigtail catheter was advanced into the abdominal aorta. Abdominal aortogram was performed. This was followed by retraction of the pigtail catheter into the distal aorta. Runoff arteriography was then performed including bilateral oblique arteriography of the pelvis and frontal arteriography of lower extremity runoff into both feet.  After the procedure, the catheter was removed and the sheath removed. Hemostasis was obtained with manual compression and use of a V-Pad.  Complications: None  FINDINGS: Aorta: Normally patent. Bilateral single renal arteries are widely patent.  Left lower extremity: Iliac inflow shows a smoothly tapered stenosis of the mid common iliac artery with maximal 50% arterial stenosis. The internal and external iliac arteries show mild disease without significant stenosis. The common femoral artery is normally patent. The femoral bifurcation is normally patent and shows no significant disease. Scattered plaque is identified in the superficial femoral artery without significant  stenosis identified. Significant plaque is present just distal to the adductor hiatus at the level of the SFA/popliteal junction and in the proximal popliteal artery. Just above the femoral condyles, segmental high grade stenosis with near subtotal occlusion of the popliteal artery is identified. The popliteal artery also shows approximately 50% narrowing in its mid segment across the knee joint. The posterior tibial artery is continuously patent into the foot. The anterior tibial artery is open and shows slower flow. In the distal calf, nearly at the ankle, stenosis of the anterior tibial artery is seen approaching 80% narrowing without complete occlusion. The peroneal artery is only visualized in the proximal calf and becomes occluded.  Right lower extremity: No significant disease of the common, internal or external iliac arteries. The common femoral artery shows mild disease without significant stenosis. The femoral  bifurcation shows normal patency. Moderate atherosclerotic plaque is identified in the distal SFA with several areas of narrowing approaching 50- 60%. Smoothly tapered narrowing of the proximal popliteal artery above the knee approaches 50%. Stenosis of the mid popliteal segment also approaches 50% narrowing. Continuously patent anterior tibial and posterior tibial runoff is identified into the foot. The peroneal artery is very small in caliber and open into the distal calf.  IMPRESSION: 1. Severe stenosis with subtotal occlusion of the proximal left popliteal artery near its junction with the SFA. Continuous posterior tibial artery runoff is present below the knee. The anterior tibial artery is open and stenotic in the distal calf. 50% stenosis of the mid left common iliac artery identified. 2. Distal right SFA disease with maximal narrowing approaching 60%. Proximal and mid popliteal artery on the right shows areas of 50% narrowing. Patent anterior tibial and posterior tibial runoff is identified  in the lower right leg.   Electronically Signed   By: Aletta Edouard M.D.   On: 10/23/2013 17:53   Ir Angiogram Extremity Bilateral  10/23/2013   CLINICAL DATA:  Left foot gangrene and osteomyelitis. Nonhealing of wound with clinical evidence of peripheral vascular disease.  EXAM: 1. ULTRASOUND GUIDANCE FOR VASCULAR ACCESS OF THE RIGHT COMMON FEMORAL ARTERY 2. ABDOMINAL AORTOGRAPHY 3. BILATERAL LOWER EXTREMITY ARTERIOGRAPHY  MEDICATIONS: Sedation:  4 mg IV Versed, 200 mcg IV fentanyl.  Total Moderate Sedation Time:  40 minutes.  CONTRAST:  139mL VISIPAQUE IODIXANOL 320 MG/ML IV SOLN  FLUOROSCOPY TIME:  6 min and 30 seconds.  PROCEDURE: The right groin was sterilely prepped with Betadine and draped. Local anesthesia was provided with 1% lidocaine. Ultrasound was used to confirm patency of the right common femoral artery.  The right common femoral artery was accessed utilizing a micropuncture set and ultrasound guidance. A guidewire was advanced into the abdominal aorta. A 5 French vascular sheath was placed. A 5 French pigtail catheter was advanced into the abdominal aorta. Abdominal aortogram was performed. This was followed by retraction of the pigtail catheter into the distal aorta. Runoff arteriography was then performed including bilateral oblique arteriography of the pelvis and frontal arteriography of lower extremity runoff into both feet.  After the procedure, the catheter was removed and the sheath removed. Hemostasis was obtained with manual compression and use of a V-Pad.  Complications: None  FINDINGS: Aorta: Normally patent. Bilateral single renal arteries are widely patent.  Left lower extremity: Iliac inflow shows a smoothly tapered stenosis of the mid common iliac artery with maximal 50% arterial stenosis. The internal and external iliac arteries show mild disease without significant stenosis. The common femoral artery is normally patent. The femoral bifurcation is normally patent and shows no  significant disease. Scattered plaque is identified in the superficial femoral artery without significant stenosis identified. Significant plaque is present just distal to the adductor hiatus at the level of the SFA/popliteal junction and in the proximal popliteal artery. Just above the femoral condyles, segmental high grade stenosis with near subtotal occlusion of the popliteal artery is identified. The popliteal artery also shows approximately 50% narrowing in its mid segment across the knee joint. The posterior tibial artery is continuously patent into the foot. The anterior tibial artery is open and shows slower flow. In the distal calf, nearly at the ankle, stenosis of the anterior tibial artery is seen approaching 80% narrowing without complete occlusion. The peroneal artery is only visualized in the proximal calf and becomes occluded.  Right lower extremity: No significant  disease of the common, internal or external iliac arteries. The common femoral artery shows mild disease without significant stenosis. The femoral bifurcation shows normal patency. Moderate atherosclerotic plaque is identified in the distal SFA with several areas of narrowing approaching 50- 60%. Smoothly tapered narrowing of the proximal popliteal artery above the knee approaches 50%. Stenosis of the mid popliteal segment also approaches 50% narrowing. Continuously patent anterior tibial and posterior tibial runoff is identified into the foot. The peroneal artery is very small in caliber and open into the distal calf.  IMPRESSION: 1. Severe stenosis with subtotal occlusion of the proximal left popliteal artery near its junction with the SFA. Continuous posterior tibial artery runoff is present below the knee. The anterior tibial artery is open and stenotic in the distal calf. 50% stenosis of the mid left common iliac artery identified. 2. Distal right SFA disease with maximal narrowing approaching 60%. Proximal and mid popliteal artery on  the right shows areas of 50% narrowing. Patent anterior tibial and posterior tibial runoff is identified in the lower right leg.   Electronically Signed   By: Aletta Edouard M.D.   On: 10/23/2013 17:53   Mr Foot Left Wo Contrast  10/22/2013   CLINICAL DATA:  Soft tissue wound of the lateral aspect of the left foot.  EXAM: MRI OF THE LEFT FOREFOOT WITHOUT CONTRAST  TECHNIQUE: Multiplanar, multisequence MR imaging was performed. No intravenous contrast was administered.  COMPARISON:  Radiographs dated 10/21/2013  FINDINGS: The areas osteomyelitis of the head of the 4th metatarsal and of the base of the proximal phalangeal bone of the 4th toe with bone destruction as indicated on the radiographs. There is gas in the soft tissues dorsal to the head of 4th metatarsal. There is no effusion in the 4th metatarsal phalangeal joint.  There is also edema in the base of the 5th metatarsal with osseous irregularity of the stump of the bone. This could be reactive due to the surgery done in October but given the adjacent osteomyelitis the possibly of osteomyelitis of the stump of the 5th metatarsal should be considered. There appears to be a soft tissue ulceration over that area as well.  There are no definable abscesses.  No other acute abnormality of the bones of the forefoot.  IMPRESSION: 1. Osteomyelitis of the head of the 4th metatarsal and of the base of the proximal phalanx of the 4th toe. 2. Osteomyelitis of the stump of the 5th metatarsal.   Electronically Signed   By: Rozetta Nunnery M.D.   On: 10/22/2013 15:15   Ir US Guide Vasc Access Right  10/23/2013   CLINICAL DATA:  Left foot gangrene and osteomyelitis. Nonhealing of wound with clinical evidence of peripheral vascular disease.  EXAM: 1. ULTRASOUND GUIDANCE FOR VASCULAR ACCESS OF THE RIGHT COMMON FEMORAL ARTERY 2. ABDOMINAL AORTOGRAPHY 3. BILATERAL LOWER EXTREMITY ARTERIOGRAPHY  MEDICATIONS: Sedation:  4 mg IV Versed, 200 mcg IV fentanyl.  Total Moderate  Sedation Time:  40 minutes.  CONTRAST:  154mL VISIPAQUE IODIXANOL 320 MG/ML IV SOLN  FLUOROSCOPY TIME:  6 min and 30 seconds.  PROCEDURE: The right groin was sterilely prepped with Betadine and draped. Local anesthesia was provided with 1% lidocaine. Ultrasound was used to confirm patency of the right common femoral artery.  The right common femoral artery was accessed utilizing a micropuncture set and ultrasound guidance. A guidewire was advanced into the abdominal aorta. A 5 French vascular sheath was placed. A 5 French pigtail catheter was advanced into the abdominal  aorta. Abdominal aortogram was performed. This was followed by retraction of the pigtail catheter into the distal aorta. Runoff arteriography was then performed including bilateral oblique arteriography of the pelvis and frontal arteriography of lower extremity runoff into both feet.  After the procedure, the catheter was removed and the sheath removed. Hemostasis was obtained with manual compression and use of a V-Pad.  Complications: None  FINDINGS: Aorta: Normally patent. Bilateral single renal arteries are widely patent.  Left lower extremity: Iliac inflow shows a smoothly tapered stenosis of the mid common iliac artery with maximal 50% arterial stenosis. The internal and external iliac arteries show mild disease without significant stenosis. The common femoral artery is normally patent. The femoral bifurcation is normally patent and shows no significant disease. Scattered plaque is identified in the superficial femoral artery without significant stenosis identified. Significant plaque is present just distal to the adductor hiatus at the level of the SFA/popliteal junction and in the proximal popliteal artery. Just above the femoral condyles, segmental high grade stenosis with near subtotal occlusion of the popliteal artery is identified. The popliteal artery also shows approximately 50% narrowing in its mid segment across the knee joint. The  posterior tibial artery is continuously patent into the foot. The anterior tibial artery is open and shows slower flow. In the distal calf, nearly at the ankle, stenosis of the anterior tibial artery is seen approaching 80% narrowing without complete occlusion. The peroneal artery is only visualized in the proximal calf and becomes occluded.  Right lower extremity: No significant disease of the common, internal or external iliac arteries. The common femoral artery shows mild disease without significant stenosis. The femoral bifurcation shows normal patency. Moderate atherosclerotic plaque is identified in the distal SFA with several areas of narrowing approaching 50- 60%. Smoothly tapered narrowing of the proximal popliteal artery above the knee approaches 50%. Stenosis of the mid popliteal segment also approaches 50% narrowing. Continuously patent anterior tibial and posterior tibial runoff is identified into the foot. The peroneal artery is very small in caliber and open into the distal calf.  IMPRESSION: 1. Severe stenosis with subtotal occlusion of the proximal left popliteal artery near its junction with the SFA. Continuous posterior tibial artery runoff is present below the knee. The anterior tibial artery is open and stenotic in the distal calf. 50% stenosis of the mid left common iliac artery identified. 2. Distal right SFA disease with maximal narrowing approaching 60%. Proximal and mid popliteal artery on the right shows areas of 50% narrowing. Patent anterior tibial and posterior tibial runoff is identified in the lower right leg.   Electronically Signed   By: Aletta Edouard M.D.   On: 10/23/2013 17:53    Scheduled Meds: . aspirin EC  81 mg Oral Daily  . fentaNYL      . glimepiride  4 mg Oral QAC breakfast  . lidocaine      . linagliptin  5 mg Oral Q supper   And  . metFORMIN  2,000 mg Oral Q supper  . lisinopril  20 mg Oral BID  . metoprolol succinate  100 mg Oral Daily  . midazolam      .  multivitamin with minerals  1 tablet Oral Daily  . piperacillin-tazobactam (ZOSYN)  IV  3.375 g Intravenous Q8H  . sodium chloride  3 mL Intravenous Q12H  . vancomycin  1,000 mg Intravenous Q12H   Continuous Infusions: . sodium chloride 75 mL/hr at 10/22/13 1040  . sodium chloride 75 mL/hr at 10/23/13  1452    Principal Problem:   Osteomyelitis of left foot Active Problems:   DIABETES MELLITUS, TYPE II   HYPERTENSION   Leukocytosis   Anemia    Time spent: > 35 minutes    Velvet Bathe  [facility] Pager [phone] If 7PM-7AM, please contact night-coverage at [email or web address], password [facility] 10/23/2013, 6:31 PM  LOS: 2 days

## 2013-10-24 DIAGNOSIS — Z01810 Encounter for preprocedural cardiovascular examination: Secondary | ICD-10-CM

## 2013-10-24 LAB — BASIC METABOLIC PANEL
BUN: 20 mg/dL (ref 6–23)
Calcium: 8.1 mg/dL — ABNORMAL LOW (ref 8.4–10.5)
Creatinine, Ser: 1.53 mg/dL — ABNORMAL HIGH (ref 0.50–1.35)
GFR calc Af Amer: 58 mL/min — ABNORMAL LOW (ref >90)
Sodium: 124 mEq/L — ABNORMAL LOW (ref 135–145)

## 2013-10-24 LAB — GLUCOSE, CAPILLARY
Glucose-Capillary: 134 mg/dL — ABNORMAL HIGH (ref 70–99)
Glucose-Capillary: 136 mg/dL — ABNORMAL HIGH (ref 70–99)

## 2013-10-24 NOTE — Progress Notes (Addendum)
   I briefly reviewed the patient angiogram, I would treat it with a Viabahn stent in the above-the-knee popliteal segment.  I don't think the active flexion segment of the popliteal artery is involved.  Earliest the patient can be scheduled in Tuesday as Monday is completely booked at this point.  Unfortunately, this can't be absolutely verified except on the angiography table.  The other option is proceeding with an AK to BK popliteal bypass.  Vein mapping would need to be obtained to determine the feasibility of that procedure.    From a vascular viewpoint, once the vein mapping is obtained, the patient can come back to [facility] for the endovascular attempt.    Adele Barthel, MD [facility] [place] Office: [phone] Pager: [phone]  10/24/2013, 10:22 AM  Addendum  Vein mapping of LLE: demonstrates adequate conduit for bypass if needed.  Defer wound care to Orthopedics.  At earliest, pt can be scheduled for stenting of L pop artery on Tuesday.  Will defer final decision to Dr. Scot Dock.   Adele Barthel, MD [facility] [place] Office: [phone] Pager: [phone]  10/24/2013, 10:35 AM

## 2013-10-24 NOTE — Progress Notes (Signed)
[facility] PROGRESS NOTE  Alexander [name] [id number] DOB: 01/30/1961 DOA: 10/21/2013 PCP: No primary provider on file.  Assessment/Plan: Osteomyelitis of left foot  - Orthopedic surgeon and vascular surgeon on board. Workup currently underway for management recommendations - I would like to thank Dr. Bridgett Larsson for his input.  - Continue broad spectrum IV antibiotic - for now continue vanco and zosyn  - ABI and MRI of the left foot obtained - Arteriogram ordered for further evaluation and recommendations.  Active Problems:  DIABETES MELLITUS, TYPE II  - may continue amaryl and januvia - Blood sugars relatively well controlled currently  HYPERTENSION  - continue lisinopril and metoprolol  - Continue hydralazine 10 mg every 6 hours IV PRN for better BP control  - Given possibility of vascular supply to lower extremity we'll not be overly aggressive in treating elevated blood pressure  Hyponatremia  - Most likely due to poor oral solid intake - Improved with IV fluids as such will continue at this juncture  Leukocytosis  - secondary to left foot osteomyelitis  - continue to monitor WBC count - Continue IV antibiotics vancomycin and Zosyn  Anemia  - hemoglobin 10.4 initially, no active bleeding currently stable  Code Status: full Family Communication: Discussed at length with family (wife and daughters)  Disposition Plan: IV antibiotics with transfer to [place] likely Monday for Vascular procedure most likely on Tuesday.   Consultants:  Orthopaedic surgery  Vascular surgery  Procedures:  ABI  MRI of left foot  Arteriogram Pending most likely 10/23/13  Antibiotics:  Vancomycin and zosyn  HPI/Subjective: No new complaints. No acute issues reported overnight  Objective: Filed Vitals:   10/24/13 0951  BP: 184/74  Pulse: 82  Temp:   Resp: 20    Intake/Output Summary (Last 24 hours) at 10/24/13 1155 Last data filed at 10/24/13 0900  Gross per 24  hour  Intake 2638.75 ml  Output    200 ml  Net 2438.75 ml   Filed Weights   10/21/13 1500 10/22/13 0500 10/24/13 0631  Weight: 108.863 kg (240 lb) 108.863 kg (240 lb) 110.451 kg (243 lb 8 oz)    Exam:   General:  Pt in NAD, Alert and Awake  Cardiovascular: RRR, no MRG  Respiratory: CTA BL, no wheezes  Abdomen: soft, NT, ND  Musculoskeletal: no cyanosis or clubbing. Patient has ulcer at left foot  Data Reviewed: Basic Metabolic Panel:  Recent Labs Lab 10/21/13 1545 10/21/13 2007 10/22/13 0525  NA 116* 119* 122*  K 5.0 4.5 4.4  CL 86* 88* 89*  CO2 19 22 20   GLUCOSE 141* 123* 78  BUN 14 15 17   CREATININE 0.89 0.93 1.19  CALCIUM 8.6 8.5 8.4  MG  --  2.1  --   PHOS  --  4.5  --    Liver Function Tests:  Recent Labs Lab 10/21/13 2007  AST 20  ALT 26  ALKPHOS 80  BILITOT 0.5  PROT 6.0  ALBUMIN 2.4*   No results found for this basename: LIPASE, AMYLASE,  in the last 168 hours No results found for this basename: AMMONIA,  in the last 168 hours CBC:  Recent Labs Lab 10/21/13 1545 10/21/13 2007 10/22/13 0525  WBC 12.5* 12.9* 10.8*  NEUTROABS  --  10.3*  --   HGB 10.4* 10.2* 10.3*  HCT 28.5* 27.6* 27.9*  MCV 85.6 85.2 85.8  PLT 266 275 281   Cardiac Enzymes: No results found for this basename: CKTOTAL, CKMB, CKMBINDEX, TROPONINI,  in the last 168 hours BNP (last 3 results) No results found for this basename: PROBNP,  in the last 8760 hours CBG:  Recent Labs Lab 10/21/13 1454 10/22/13 1043 10/23/13 0741 10/23/13 2034 10/24/13 0726  GLUCAP 140* 83 119* 145* 134*    Recent Results (from the past 240 hour(s))  URINE CULTURE     Status: None   Collection Time    10/22/13  3:10 PM      Result Value Range Status   Specimen Description URINE, RANDOM   Final   Special Requests NONE   Final   Culture  Setup Time     Final   Value: 10/22/2013 21:54     Performed at [place]     Final   Value: NO GROWTH     Performed  at [facility]   Culture     Final   Value: NO GROWTH     Performed at [facility]   Report Status 10/23/2013 FINAL   Final     Studies: Ir Aortagram Abdominal Serialogram  10/23/2013   CLINICAL DATA:  Left foot gangrene and osteomyelitis. Nonhealing of wound with clinical evidence of peripheral vascular disease.  EXAM: 1. ULTRASOUND GUIDANCE FOR VASCULAR ACCESS OF THE RIGHT COMMON FEMORAL ARTERY 2. ABDOMINAL AORTOGRAPHY 3. BILATERAL LOWER EXTREMITY ARTERIOGRAPHY  MEDICATIONS: Sedation:  4 mg IV Versed, 200 mcg IV fentanyl.  Total Moderate Sedation Time:  40 minutes.  CONTRAST:  147mL VISIPAQUE IODIXANOL 320 MG/ML IV SOLN  FLUOROSCOPY TIME:  6 min and 30 seconds.  PROCEDURE: The right groin was sterilely prepped with Betadine and draped. Local anesthesia was provided with 1% lidocaine. Ultrasound was used to confirm patency of the right common femoral artery.  The right common femoral artery was accessed utilizing a micropuncture set and ultrasound guidance. A guidewire was advanced into the abdominal aorta. A 5 French vascular sheath was placed. A 5 French pigtail catheter was advanced into the abdominal aorta. Abdominal aortogram was performed. This was followed by retraction of the pigtail catheter into the distal aorta. Runoff arteriography was then performed including bilateral oblique arteriography of the pelvis and frontal arteriography of lower extremity runoff into both feet.  After the procedure, the catheter was removed and the sheath removed. Hemostasis was obtained with manual compression and use of a V-Pad.  Complications: None  FINDINGS: Aorta: Normally patent. Bilateral single renal arteries are widely patent.  Left lower extremity: Iliac inflow shows a smoothly tapered stenosis of the mid common iliac artery with maximal 50% arterial stenosis. The internal and external iliac arteries show mild disease without significant stenosis. The common femoral artery is normally  patent. The femoral bifurcation is normally patent and shows no significant disease. Scattered plaque is identified in the superficial femoral artery without significant stenosis identified. Significant plaque is present just distal to the adductor hiatus at the level of the SFA/popliteal junction and in the proximal popliteal artery. Just above the femoral condyles, segmental high grade stenosis with near subtotal occlusion of the popliteal artery is identified. The popliteal artery also shows approximately 50% narrowing in its mid segment across the knee joint. The posterior tibial artery is continuously patent into the foot. The anterior tibial artery is open and shows slower flow. In the distal calf, nearly at the ankle, stenosis of the anterior tibial artery is seen approaching 80% narrowing without complete occlusion. The peroneal artery is only visualized in the proximal calf and becomes occluded.  Right lower extremity: No significant disease of the common, internal or external iliac arteries. The common femoral artery shows mild disease without significant stenosis. The femoral bifurcation shows normal patency. Moderate atherosclerotic plaque is identified in the distal SFA with several areas of narrowing approaching 50- 60%. Smoothly tapered narrowing of the proximal popliteal artery above the knee approaches 50%. Stenosis of the mid popliteal segment also approaches 50% narrowing. Continuously patent anterior tibial and posterior tibial runoff is identified into the foot. The peroneal artery is very small in caliber and open into the distal calf.  IMPRESSION: 1. Severe stenosis with subtotal occlusion of the proximal left popliteal artery near its junction with the SFA. Continuous posterior tibial artery runoff is present below the knee. The anterior tibial artery is open and stenotic in the distal calf. 50% stenosis of the mid left common iliac artery identified. 2. Distal right SFA disease with maximal  narrowing approaching 60%. Proximal and mid popliteal artery on the right shows areas of 50% narrowing. Patent anterior tibial and posterior tibial runoff is identified in the lower right leg.   Electronically Signed   By: Aletta Edouard M.D.   On: 10/23/2013 17:53   Ir Angiogram Extremity Bilateral  10/23/2013   CLINICAL DATA:  Left foot gangrene and osteomyelitis. Nonhealing of wound with clinical evidence of peripheral vascular disease.  EXAM: 1. ULTRASOUND GUIDANCE FOR VASCULAR ACCESS OF THE RIGHT COMMON FEMORAL ARTERY 2. ABDOMINAL AORTOGRAPHY 3. BILATERAL LOWER EXTREMITY ARTERIOGRAPHY  MEDICATIONS: Sedation:  4 mg IV Versed, 200 mcg IV fentanyl.  Total Moderate Sedation Time:  40 minutes.  CONTRAST:  126mL VISIPAQUE IODIXANOL 320 MG/ML IV SOLN  FLUOROSCOPY TIME:  6 min and 30 seconds.  PROCEDURE: The right groin was sterilely prepped with Betadine and draped. Local anesthesia was provided with 1% lidocaine. Ultrasound was used to confirm patency of the right common femoral artery.  The right common femoral artery was accessed utilizing a micropuncture set and ultrasound guidance. A guidewire was advanced into the abdominal aorta. A 5 French vascular sheath was placed. A 5 French pigtail catheter was advanced into the abdominal aorta. Abdominal aortogram was performed. This was followed by retraction of the pigtail catheter into the distal aorta. Runoff arteriography was then performed including bilateral oblique arteriography of the pelvis and frontal arteriography of lower extremity runoff into both feet.  After the procedure, the catheter was removed and the sheath removed. Hemostasis was obtained with manual compression and use of a V-Pad.  Complications: None  FINDINGS: Aorta: Normally patent. Bilateral single renal arteries are widely patent.  Left lower extremity: Iliac inflow shows a smoothly tapered stenosis of the mid common iliac artery with maximal 50% arterial stenosis. The internal and  external iliac arteries show mild disease without significant stenosis. The common femoral artery is normally patent. The femoral bifurcation is normally patent and shows no significant disease. Scattered plaque is identified in the superficial femoral artery without significant stenosis identified. Significant plaque is present just distal to the adductor hiatus at the level of the SFA/popliteal junction and in the proximal popliteal artery. Just above the femoral condyles, segmental high grade stenosis with near subtotal occlusion of the popliteal artery is identified. The popliteal artery also shows approximately 50% narrowing in its mid segment across the knee joint. The posterior tibial artery is continuously patent into the foot. The anterior tibial artery is open and shows slower flow. In the distal calf, nearly at the ankle, stenosis of the anterior tibial artery is  seen approaching 80% narrowing without complete occlusion. The peroneal artery is only visualized in the proximal calf and becomes occluded.  Right lower extremity: No significant disease of the common, internal or external iliac arteries. The common femoral artery shows mild disease without significant stenosis. The femoral bifurcation shows normal patency. Moderate atherosclerotic plaque is identified in the distal SFA with several areas of narrowing approaching 50- 60%. Smoothly tapered narrowing of the proximal popliteal artery above the knee approaches 50%. Stenosis of the mid popliteal segment also approaches 50% narrowing. Continuously patent anterior tibial and posterior tibial runoff is identified into the foot. The peroneal artery is very small in caliber and open into the distal calf.  IMPRESSION: 1. Severe stenosis with subtotal occlusion of the proximal left popliteal artery near its junction with the SFA. Continuous posterior tibial artery runoff is present below the knee. The anterior tibial artery is open and stenotic in the  distal calf. 50% stenosis of the mid left common iliac artery identified. 2. Distal right SFA disease with maximal narrowing approaching 60%. Proximal and mid popliteal artery on the right shows areas of 50% narrowing. Patent anterior tibial and posterior tibial runoff is identified in the lower right leg.   Electronically Signed   By: Aletta Edouard M.D.   On: 10/23/2013 17:53   Mr Foot Left Wo Contrast  10/22/2013   CLINICAL DATA:  Soft tissue wound of the lateral aspect of the left foot.  EXAM: MRI OF THE LEFT FOREFOOT WITHOUT CONTRAST  TECHNIQUE: Multiplanar, multisequence MR imaging was performed. No intravenous contrast was administered.  COMPARISON:  Radiographs dated 10/21/2013  FINDINGS: The areas osteomyelitis of the head of the 4th metatarsal and of the base of the proximal phalangeal bone of the 4th toe with bone destruction as indicated on the radiographs. There is gas in the soft tissues dorsal to the head of 4th metatarsal. There is no effusion in the 4th metatarsal phalangeal joint.  There is also edema in the base of the 5th metatarsal with osseous irregularity of the stump of the bone. This could be reactive due to the surgery done in October but given the adjacent osteomyelitis the possibly of osteomyelitis of the stump of the 5th metatarsal should be considered. There appears to be a soft tissue ulceration over that area as well.  There are no definable abscesses.  No other acute abnormality of the bones of the forefoot.  IMPRESSION: 1. Osteomyelitis of the head of the 4th metatarsal and of the base of the proximal phalanx of the 4th toe. 2. Osteomyelitis of the stump of the 5th metatarsal.   Electronically Signed   By: Rozetta Nunnery M.D.   On: 10/22/2013 15:15   Ir US Guide Vasc Access Right  10/23/2013   CLINICAL DATA:  Left foot gangrene and osteomyelitis. Nonhealing of wound with clinical evidence of peripheral vascular disease.  EXAM: 1. ULTRASOUND GUIDANCE FOR VASCULAR ACCESS OF THE  RIGHT COMMON FEMORAL ARTERY 2. ABDOMINAL AORTOGRAPHY 3. BILATERAL LOWER EXTREMITY ARTERIOGRAPHY  MEDICATIONS: Sedation:  4 mg IV Versed, 200 mcg IV fentanyl.  Total Moderate Sedation Time:  40 minutes.  CONTRAST:  137mL VISIPAQUE IODIXANOL 320 MG/ML IV SOLN  FLUOROSCOPY TIME:  6 min and 30 seconds.  PROCEDURE: The right groin was sterilely prepped with Betadine and draped. Local anesthesia was provided with 1% lidocaine. Ultrasound was used to confirm patency of the right common femoral artery.  The right common femoral artery was accessed utilizing a micropuncture set and ultrasound  guidance. A guidewire was advanced into the abdominal aorta. A 5 French vascular sheath was placed. A 5 French pigtail catheter was advanced into the abdominal aorta. Abdominal aortogram was performed. This was followed by retraction of the pigtail catheter into the distal aorta. Runoff arteriography was then performed including bilateral oblique arteriography of the pelvis and frontal arteriography of lower extremity runoff into both feet.  After the procedure, the catheter was removed and the sheath removed. Hemostasis was obtained with manual compression and use of a V-Pad.  Complications: None  FINDINGS: Aorta: Normally patent. Bilateral single renal arteries are widely patent.  Left lower extremity: Iliac inflow shows a smoothly tapered stenosis of the mid common iliac artery with maximal 50% arterial stenosis. The internal and external iliac arteries show mild disease without significant stenosis. The common femoral artery is normally patent. The femoral bifurcation is normally patent and shows no significant disease. Scattered plaque is identified in the superficial femoral artery without significant stenosis identified. Significant plaque is present just distal to the adductor hiatus at the level of the SFA/popliteal junction and in the proximal popliteal artery. Just above the femoral condyles, segmental high grade stenosis  with near subtotal occlusion of the popliteal artery is identified. The popliteal artery also shows approximately 50% narrowing in its mid segment across the knee joint. The posterior tibial artery is continuously patent into the foot. The anterior tibial artery is open and shows slower flow. In the distal calf, nearly at the ankle, stenosis of the anterior tibial artery is seen approaching 80% narrowing without complete occlusion. The peroneal artery is only visualized in the proximal calf and becomes occluded.  Right lower extremity: No significant disease of the common, internal or external iliac arteries. The common femoral artery shows mild disease without significant stenosis. The femoral bifurcation shows normal patency. Moderate atherosclerotic plaque is identified in the distal SFA with several areas of narrowing approaching 50- 60%. Smoothly tapered narrowing of the proximal popliteal artery above the knee approaches 50%. Stenosis of the mid popliteal segment also approaches 50% narrowing. Continuously patent anterior tibial and posterior tibial runoff is identified into the foot. The peroneal artery is very small in caliber and open into the distal calf.  IMPRESSION: 1. Severe stenosis with subtotal occlusion of the proximal left popliteal artery near its junction with the SFA. Continuous posterior tibial artery runoff is present below the knee. The anterior tibial artery is open and stenotic in the distal calf. 50% stenosis of the mid left common iliac artery identified. 2. Distal right SFA disease with maximal narrowing approaching 60%. Proximal and mid popliteal artery on the right shows areas of 50% narrowing. Patent anterior tibial and posterior tibial runoff is identified in the lower right leg.   Electronically Signed   By: Aletta Edouard M.D.   On: 10/23/2013 17:53    Scheduled Meds: . aspirin EC  81 mg Oral Daily  . glimepiride  4 mg Oral QAC breakfast  . linagliptin  5 mg Oral Q supper    And  . metFORMIN  2,000 mg Oral Q supper  . lisinopril  20 mg Oral BID  . metoprolol succinate  100 mg Oral Daily  . multivitamin with minerals  1 tablet Oral Daily  . piperacillin-tazobactam (ZOSYN)  IV  3.375 g Intravenous Q8H  . sodium chloride  3 mL Intravenous Q12H  . vancomycin  1,000 mg Intravenous Q12H   Continuous Infusions: . sodium chloride 75 mL/hr at 10/22/13 1040  . sodium  chloride 75 mL/hr at 10/24/13 [id number]    Principal Problem:   Osteomyelitis of left foot Active Problems:   DIABETES MELLITUS, TYPE II   HYPERTENSION   Leukocytosis   Anemia    Time spent: > 35 minutes    Velvet Bathe  [facility] Pager [phone] If 7PM-7AM, please contact night-coverage at [email or web address], password [email or web address] 10/24/2013, 11:55 AM  LOS: 3 days

## 2013-10-24 NOTE — Progress Notes (Signed)
VASCULAR LAB PRELIMINARY  PRELIMINARY  PRELIMINARY  PRELIMINARY  Left Lower Extremity Vein Map    Left Great Saphenous Vein   Segment Diameter Comment  1. Origin 4.9 mm Patent compressible branch  2. High Thigh 5.4 mm Patent compressible enlarged lymph nodes  3. Mid Thigh 4.4 mm Patent compressible branch  4.Mid to distal thigh 3.2 mm Patent compressible multiple branches  5. Distal thigh 3.9 mm Patent compressible runs medial  6. Knee 3.6 mm Patent compressible  7. Below the knee 3.9 mm Patent compressible proximal to mid calf 3.1 mm with a branch  8. Mid calf 2.6 mm Patent compressible multiple branches 2.6 mm mid calf to ankle  9. Ankle 2.7 mm Patent compressible multiple branches interstitial fluid        Alexander [name], RVS 10/24/2013, 10:14 AM

## 2013-10-24 NOTE — Progress Notes (Signed)
10/24/13 0930 Telemetry monitor batteries were changed.

## 2013-10-25 LAB — BASIC METABOLIC PANEL
BUN: 16 mg/dL (ref 6–23)
Chloride: 98 mEq/L (ref 96–112)
Creatinine, Ser: 1.47 mg/dL — ABNORMAL HIGH (ref 0.50–1.35)
GFR calc Af Amer: 61 mL/min — ABNORMAL LOW (ref >90)
Potassium: 4.3 mEq/L (ref 3.5–5.1)
Sodium: 127 mEq/L — ABNORMAL LOW (ref 135–145)

## 2013-10-25 LAB — RETICULOCYTES
RBC.: 3.32 MIL/uL — ABNORMAL LOW (ref 4.22–5.81)
Retic Ct Pct: 0.8 % (ref 0.4–3.1)

## 2013-10-25 LAB — GLUCOSE, CAPILLARY: Glucose-Capillary: 139 mg/dL — ABNORMAL HIGH (ref 70–99)

## 2013-10-25 LAB — CBC
MCHC: 36.1 g/dL — ABNORMAL HIGH (ref 30.0–36.0)
MCV: 85.9 fL (ref 78.0–100.0)
Platelets: 302 10*3/uL (ref 150–400)
RDW: 11.9 % (ref 11.5–15.5)
WBC: 10.5 10*3/uL (ref 4.0–10.5)

## 2013-10-25 LAB — IRON AND TIBC: TIBC: 134 ug/dL — ABNORMAL LOW (ref 215–435)

## 2013-10-25 NOTE — Progress Notes (Signed)
VASCULAR SURGERY:  Dr. Lianne Moris note appreciated. I agree that he could potentially have a covered stent placed in order to address  the above knee popliteal artery stenosis. However, I think that he also has some disease in the L CFA and also the popliteal artery behind the knee. There is also narrowing of the proximal PTA artery. The vein looks reasonable on vein map. I would favor infrainguinal bypass and 4th toe amp on Tuesday. Once improved blood flow is established he will need more definitive work on his foot by ortho later.  Please proceed with transfer to [facility] tomorrow AM (10/26/13) as planned. I will discuss surgery with him again tomorrow at [facility]  [name], MD, [place] [phone] 10/25/2013

## 2013-10-25 NOTE — Progress Notes (Signed)
ANTIBIOTIC CONSULT NOTE - FOLLOW UP  Pharmacy Consult for Vancomycin, Zosyn Indication: Gangrenous diabetic foot wound  Allergies  Allergen Reactions  . Sulfa Antibiotics Rash    Patient Measurements: Height: 6' (182.9 cm) Weight: 246 lb 14.4 oz (111.993 kg) IBW/kg (Calculated) : 77.6  Vital Signs: Temp: 97.9 F (36.6 C) (12/14 0658) Temp src: Oral (12/14 0658) BP: 192/84 mmHg (12/14 0658) Pulse Rate: 72 (12/14 0658) Intake/Output from previous day: 12/13 0701 - 12/14 0700 In: 3205 [P.O.:1080; I.V.:1575; IV Piggyback:550] Out: 1300 [Urine:1300] Intake/Output from this shift: Total I/O In: 240 [P.O.:240] Out: 200 [Urine:200]  Labs:  Recent Labs  10/24/13 1334 10/25/13 0623  WBC  --  10.5  HGB  --  9.0*  PLT  --  302  CREATININE 1.53* 1.47*   Estimated Creatinine Clearance: 74.3 ml/min (by C-G formula based on Cr of 1.47).  Recent Labs  10/23/13 1555  VANCOTROUGH 16.6     Microbiology: Recent Results (from the past 720 hour(s))  URINE CULTURE     Status: None   Collection Time    10/22/13  3:10 PM      Result Value Range Status   Specimen Description URINE, RANDOM   Final   Special Requests NONE   Final   Culture  Setup Time     Final   Value: 10/22/2013 21:54     Performed at [place]     Final   Value: NO GROWTH     Performed at [facility]   Culture     Final   Value: NO GROWTH     Performed at [facility]   Report Status 10/23/2013 FINAL   Final    Anti-infectives   Start     Dose/Rate Route Frequency Ordered Stop   10/22/13 0500  vancomycin (VANCOCIN) IVPB 1000 mg/200 mL premix     1,000 mg 200 mL/hr over 60 Minutes Intravenous Every 12 hours 10/21/13 1606     10/22/13 0100  piperacillin-tazobactam (ZOSYN) IVPB 3.375 g     3.375 g 12.5 mL/hr over 240 Minutes Intravenous Every 8 hours 10/21/13 1606     10/21/13 1615  vancomycin (VANCOCIN) 2,000 mg in sodium chloride 0.9 % 500 mL IVPB     2,000  mg 250 mL/hr over 120 Minutes Intravenous STAT 10/21/13 1606 10/21/13 2000   10/21/13 1615  piperacillin-tazobactam (ZOSYN) IVPB 3.375 g     3.375 g 100 mL/hr over 30 Minutes Intravenous STAT 10/21/13 1606 10/21/13 1728      Assessment: 52yo male with poorly controlled diabetes and recent left 5th toe amputation due to osteomyelitis presents to [facility] with gangrenous foot/left fourth toe and fever. Pharmacy consulted to dose vancomycin and zoysn.   Day #4 Vancomycin and Zosyn  Tmax: afebrile  WBC: improved to 10.5  Renal: Scr trending up (0.89 >0.93 > 1.19 > 1.53 > 1.47) with CrCl ~ 74 ml/min CG, and ~ 58 ml/min Normalized.  First VT on 12/12 therapeutic at 16.6  Goal of Therapy:  Vancomycin trough level 15-20 mcg/ml Appropriate abx dosing, eradication of infection.   Plan:   Continue Zosyn 3.375g IV Q8H infused over 4hrs.   Continue Vancomycin 1g IV q12h.  Recheck Vanc trough today.  Follow up renal fxn and culture results.   Gretta Arab PharmD, BCPS Pager [phone] 10/25/2013 10:22 AM

## 2013-10-25 NOTE — Progress Notes (Signed)
[facility] PROGRESS NOTE  Alexander [name] [id number] DOB: 01/19/1961 DOA: 10/21/2013 PCP: No primary provider on file.  Assessment/Plan: Osteomyelitis of left foot  - Orthopedic surgeon and vascular surgeon on board. Patient has had extensive work up for the best recommendations given his current condition.  - I would like to thank Dr. Bridgett Larsson for his input over the weekend and plans will be to transition patient to [facility] next a.m. 10/26/2013.  - Continue broad spectrum IV antibiotic while in house. - for now continue vanco and zosyn  - ABI and MRI of the left foot obtained - Arteriogram obtained and reviewed by vascular surgeon  Active Problems:  DIABETES MELLITUS, TYPE II  - may continue amaryl and januvia - Blood sugars relatively well controlled currently  HYPERTENSION  - continue lisinopril and metoprolol  - Continue hydralazine 10 mg every 6 hours IV PRN for better BP control  - Given possibility of vascular supply to lower extremity we'll not be overly aggressive in treating elevated blood pressure  Hyponatremia  - Most likely due to poor oral solid intake - Improved with IV fluids as such will continue at this juncture  Leukocytosis  - secondary to left foot osteomyelitis  - continue to monitor WBC count - Continue IV antibiotics vancomycin and Zosyn  Anemia  - Will obtain anemia panel and reassess levels next a.m.  Code Status: full Family Communication: Discussed at length with family (wife and daughters)  Disposition Plan: IV antibiotics with transfer to [place] likely Monday for Vascular procedure most likely on Tuesday.   Consultants:  Orthopaedic surgery  Vascular surgery  Procedures:  ABI  MRI of left foot  Arteriogram Pending most likely 10/23/13  Antibiotics:  Vancomycin and zosyn  HPI/Subjective: No new complaints. No acute issues reported overnight  Objective: Filed Vitals:   10/25/13 0658  BP: 192/84  Pulse: 72   Temp: 97.9 F (36.6 C)  Resp: 20    Intake/Output Summary (Last 24 hours) at 10/25/13 1241 Last data filed at 10/25/13 1205  Gross per 24 hour  Intake   2810 ml  Output   2375 ml  Net    435 ml   Filed Weights   10/24/13 0631 10/25/13 0413 10/25/13 0658  Weight: 110.451 kg (243 lb 8 oz) 113.172 kg (249 lb 8 oz) 111.993 kg (246 lb 14.4 oz)    Exam:   General:  Pt in NAD, Alert and Awake  Cardiovascular: RRR, no MRG  Respiratory: CTA BL, no wheezes  Abdomen: soft, NT, ND  Musculoskeletal: no cyanosis or clubbing. Patient has ulcer at left foot  Data Reviewed: Basic Metabolic Panel:  Recent Labs Lab 10/21/13 1545 10/21/13 2007 10/22/13 0525 10/24/13 1334 10/25/13 0623  NA 116* 119* 122* 124* 127*  K 5.0 4.5 4.4 4.3 4.3  CL 86* 88* 89* 92* 98  CO2 19 22 20 19 21   GLUCOSE 141* 123* 78 153* 188*  BUN 14 15 17 20 16   CREATININE 0.89 0.93 1.19 1.53* 1.47*  CALCIUM 8.6 8.5 8.4 8.1* 7.9*  MG  --  2.1  --   --   --   PHOS  --  4.5  --   --   --    Liver Function Tests:  Recent Labs Lab 10/21/13 2007  AST 20  ALT 26  ALKPHOS 80  BILITOT 0.5  PROT 6.0  ALBUMIN 2.4*   No results found for this basename: LIPASE, AMYLASE,  in the last 168 hours No  results found for this basename: AMMONIA,  in the last 168 hours CBC:  Recent Labs Lab 10/21/13 1545 10/21/13 2007 10/22/13 0525 10/25/13 0623  WBC 12.5* 12.9* 10.8* 10.5  NEUTROABS  --  10.3*  --   --   HGB 10.4* 10.2* 10.3* 9.0*  HCT 28.5* 27.6* 27.9* 24.9*  MCV 85.6 85.2 85.8 85.9  PLT 266 275 281 302   Cardiac Enzymes: No results found for this basename: CKTOTAL, CKMB, CKMBINDEX, TROPONINI,  in the last 168 hours BNP (last 3 results) No results found for this basename: PROBNP,  in the last 8760 hours CBG:  Recent Labs Lab 10/23/13 0741 10/23/13 2034 10/24/13 0726 10/24/13 2050 10/25/13 0737  GLUCAP 119* 145* 134* 136* 139*    Recent Results (from the past 240 hour(s))  URINE CULTURE      Status: None   Collection Time    10/22/13  3:10 PM      Result Value Range Status   Specimen Description URINE, RANDOM   Final   Special Requests NONE   Final   Culture  Setup Time     Final   Value: 10/22/2013 21:54     Performed at [facility]     Final   Value: NO GROWTH     Performed at [facility]   Culture     Final   Value: NO GROWTH     Performed at [facility]   Report Status 10/23/2013 FINAL   Final     Studies: Ir Aortagram Abdominal Serialogram  10/23/2013   CLINICAL DATA:  Left foot gangrene and osteomyelitis. Nonhealing of wound with clinical evidence of peripheral vascular disease.  EXAM: 1. ULTRASOUND GUIDANCE FOR VASCULAR ACCESS OF THE RIGHT COMMON FEMORAL ARTERY 2. ABDOMINAL AORTOGRAPHY 3. BILATERAL LOWER EXTREMITY ARTERIOGRAPHY  MEDICATIONS: Sedation:  4 mg IV Versed, 200 mcg IV fentanyl.  Total Moderate Sedation Time:  40 minutes.  CONTRAST:  141mL VISIPAQUE IODIXANOL 320 MG/ML IV SOLN  FLUOROSCOPY TIME:  6 min and 30 seconds.  PROCEDURE: The right groin was sterilely prepped with Betadine and draped. Local anesthesia was provided with 1% lidocaine. Ultrasound was used to confirm patency of the right common femoral artery.  The right common femoral artery was accessed utilizing a micropuncture set and ultrasound guidance. A guidewire was advanced into the abdominal aorta. A 5 French vascular sheath was placed. A 5 French pigtail catheter was advanced into the abdominal aorta. Abdominal aortogram was performed. This was followed by retraction of the pigtail catheter into the distal aorta. Runoff arteriography was then performed including bilateral oblique arteriography of the pelvis and frontal arteriography of lower extremity runoff into both feet.  After the procedure, the catheter was removed and the sheath removed. Hemostasis was obtained with manual compression and use of a V-Pad.  Complications: None  FINDINGS: Aorta: Normally  patent. Bilateral single renal arteries are widely patent.  Left lower extremity: Iliac inflow shows a smoothly tapered stenosis of the mid common iliac artery with maximal 50% arterial stenosis. The internal and external iliac arteries show mild disease without significant stenosis. The common femoral artery is normally patent. The femoral bifurcation is normally patent and shows no significant disease. Scattered plaque is identified in the superficial femoral artery without significant stenosis identified. Significant plaque is present just distal to the adductor hiatus at the level of the SFA/popliteal junction and in the proximal popliteal artery. Just above the femoral condyles, segmental high grade  stenosis with near subtotal occlusion of the popliteal artery is identified. The popliteal artery also shows approximately 50% narrowing in its mid segment across the knee joint. The posterior tibial artery is continuously patent into the foot. The anterior tibial artery is open and shows slower flow. In the distal calf, nearly at the ankle, stenosis of the anterior tibial artery is seen approaching 80% narrowing without complete occlusion. The peroneal artery is only visualized in the proximal calf and becomes occluded.  Right lower extremity: No significant disease of the common, internal or external iliac arteries. The common femoral artery shows mild disease without significant stenosis. The femoral bifurcation shows normal patency. Moderate atherosclerotic plaque is identified in the distal SFA with several areas of narrowing approaching 50- 60%. Smoothly tapered narrowing of the proximal popliteal artery above the knee approaches 50%. Stenosis of the mid popliteal segment also approaches 50% narrowing. Continuously patent anterior tibial and posterior tibial runoff is identified into the foot. The peroneal artery is very small in caliber and open into the distal calf.  IMPRESSION: 1. Severe stenosis with  subtotal occlusion of the proximal left popliteal artery near its junction with the SFA. Continuous posterior tibial artery runoff is present below the knee. The anterior tibial artery is open and stenotic in the distal calf. 50% stenosis of the mid left common iliac artery identified. 2. Distal right SFA disease with maximal narrowing approaching 60%. Proximal and mid popliteal artery on the right shows areas of 50% narrowing. Patent anterior tibial and posterior tibial runoff is identified in the lower right leg.   Electronically Signed   By: Aletta Edouard M.D.   On: 10/23/2013 17:53   Ir Angiogram Extremity Bilateral  10/23/2013   CLINICAL DATA:  Left foot gangrene and osteomyelitis. Nonhealing of wound with clinical evidence of peripheral vascular disease.  EXAM: 1. ULTRASOUND GUIDANCE FOR VASCULAR ACCESS OF THE RIGHT COMMON FEMORAL ARTERY 2. ABDOMINAL AORTOGRAPHY 3. BILATERAL LOWER EXTREMITY ARTERIOGRAPHY  MEDICATIONS: Sedation:  4 mg IV Versed, 200 mcg IV fentanyl.  Total Moderate Sedation Time:  40 minutes.  CONTRAST:  187mL VISIPAQUE IODIXANOL 320 MG/ML IV SOLN  FLUOROSCOPY TIME:  6 min and 30 seconds.  PROCEDURE: The right groin was sterilely prepped with Betadine and draped. Local anesthesia was provided with 1% lidocaine. Ultrasound was used to confirm patency of the right common femoral artery.  The right common femoral artery was accessed utilizing a micropuncture set and ultrasound guidance. A guidewire was advanced into the abdominal aorta. A 5 French vascular sheath was placed. A 5 French pigtail catheter was advanced into the abdominal aorta. Abdominal aortogram was performed. This was followed by retraction of the pigtail catheter into the distal aorta. Runoff arteriography was then performed including bilateral oblique arteriography of the pelvis and frontal arteriography of lower extremity runoff into both feet.  After the procedure, the catheter was removed and the sheath removed.  Hemostasis was obtained with manual compression and use of a V-Pad.  Complications: None  FINDINGS: Aorta: Normally patent. Bilateral single renal arteries are widely patent.  Left lower extremity: Iliac inflow shows a smoothly tapered stenosis of the mid common iliac artery with maximal 50% arterial stenosis. The internal and external iliac arteries show mild disease without significant stenosis. The common femoral artery is normally patent. The femoral bifurcation is normally patent and shows no significant disease. Scattered plaque is identified in the superficial femoral artery without significant stenosis identified. Significant plaque is present just distal to the adductor hiatus  at the level of the SFA/popliteal junction and in the proximal popliteal artery. Just above the femoral condyles, segmental high grade stenosis with near subtotal occlusion of the popliteal artery is identified. The popliteal artery also shows approximately 50% narrowing in its mid segment across the knee joint. The posterior tibial artery is continuously patent into the foot. The anterior tibial artery is open and shows slower flow. In the distal calf, nearly at the ankle, stenosis of the anterior tibial artery is seen approaching 80% narrowing without complete occlusion. The peroneal artery is only visualized in the proximal calf and becomes occluded.  Right lower extremity: No significant disease of the common, internal or external iliac arteries. The common femoral artery shows mild disease without significant stenosis. The femoral bifurcation shows normal patency. Moderate atherosclerotic plaque is identified in the distal SFA with several areas of narrowing approaching 50- 60%. Smoothly tapered narrowing of the proximal popliteal artery above the knee approaches 50%. Stenosis of the mid popliteal segment also approaches 50% narrowing. Continuously patent anterior tibial and posterior tibial runoff is identified into the foot.  The peroneal artery is very small in caliber and open into the distal calf.  IMPRESSION: 1. Severe stenosis with subtotal occlusion of the proximal left popliteal artery near its junction with the SFA. Continuous posterior tibial artery runoff is present below the knee. The anterior tibial artery is open and stenotic in the distal calf. 50% stenosis of the mid left common iliac artery identified. 2. Distal right SFA disease with maximal narrowing approaching 60%. Proximal and mid popliteal artery on the right shows areas of 50% narrowing. Patent anterior tibial and posterior tibial runoff is identified in the lower right leg.   Electronically Signed   By: Aletta Edouard M.D.   On: 10/23/2013 17:53   Ir US Guide Vasc Access Right  10/23/2013   CLINICAL DATA:  Left foot gangrene and osteomyelitis. Nonhealing of wound with clinical evidence of peripheral vascular disease.  EXAM: 1. ULTRASOUND GUIDANCE FOR VASCULAR ACCESS OF THE RIGHT COMMON FEMORAL ARTERY 2. ABDOMINAL AORTOGRAPHY 3. BILATERAL LOWER EXTREMITY ARTERIOGRAPHY  MEDICATIONS: Sedation:  4 mg IV Versed, 200 mcg IV fentanyl.  Total Moderate Sedation Time:  40 minutes.  CONTRAST:  182mL VISIPAQUE IODIXANOL 320 MG/ML IV SOLN  FLUOROSCOPY TIME:  6 min and 30 seconds.  PROCEDURE: The right groin was sterilely prepped with Betadine and draped. Local anesthesia was provided with 1% lidocaine. Ultrasound was used to confirm patency of the right common femoral artery.  The right common femoral artery was accessed utilizing a micropuncture set and ultrasound guidance. A guidewire was advanced into the abdominal aorta. A 5 French vascular sheath was placed. A 5 French pigtail catheter was advanced into the abdominal aorta. Abdominal aortogram was performed. This was followed by retraction of the pigtail catheter into the distal aorta. Runoff arteriography was then performed including bilateral oblique arteriography of the pelvis and frontal arteriography of lower  extremity runoff into both feet.  After the procedure, the catheter was removed and the sheath removed. Hemostasis was obtained with manual compression and use of a V-Pad.  Complications: None  FINDINGS: Aorta: Normally patent. Bilateral single renal arteries are widely patent.  Left lower extremity: Iliac inflow shows a smoothly tapered stenosis of the mid common iliac artery with maximal 50% arterial stenosis. The internal and external iliac arteries show mild disease without significant stenosis. The common femoral artery is normally patent. The femoral bifurcation is normally patent and shows no significant disease.  Scattered plaque is identified in the superficial femoral artery without significant stenosis identified. Significant plaque is present just distal to the adductor hiatus at the level of the SFA/popliteal junction and in the proximal popliteal artery. Just above the femoral condyles, segmental high grade stenosis with near subtotal occlusion of the popliteal artery is identified. The popliteal artery also shows approximately 50% narrowing in its mid segment across the knee joint. The posterior tibial artery is continuously patent into the foot. The anterior tibial artery is open and shows slower flow. In the distal calf, nearly at the ankle, stenosis of the anterior tibial artery is seen approaching 80% narrowing without complete occlusion. The peroneal artery is only visualized in the proximal calf and becomes occluded.  Right lower extremity: No significant disease of the common, internal or external iliac arteries. The common femoral artery shows mild disease without significant stenosis. The femoral bifurcation shows normal patency. Moderate atherosclerotic plaque is identified in the distal SFA with several areas of narrowing approaching 50- 60%. Smoothly tapered narrowing of the proximal popliteal artery above the knee approaches 50%. Stenosis of the mid popliteal segment also approaches 50%  narrowing. Continuously patent anterior tibial and posterior tibial runoff is identified into the foot. The peroneal artery is very small in caliber and open into the distal calf.  IMPRESSION: 1. Severe stenosis with subtotal occlusion of the proximal left popliteal artery near its junction with the SFA. Continuous posterior tibial artery runoff is present below the knee. The anterior tibial artery is open and stenotic in the distal calf. 50% stenosis of the mid left common iliac artery identified. 2. Distal right SFA disease with maximal narrowing approaching 60%. Proximal and mid popliteal artery on the right shows areas of 50% narrowing. Patent anterior tibial and posterior tibial runoff is identified in the lower right leg.   Electronically Signed   By: Aletta Edouard M.D.   On: 10/23/2013 17:53    Scheduled Meds: . aspirin EC  81 mg Oral Daily  . glimepiride  4 mg Oral QAC breakfast  . linagliptin  5 mg Oral Q supper   And  . metFORMIN  2,000 mg Oral Q supper  . lisinopril  20 mg Oral BID  . metoprolol succinate  100 mg Oral Daily  . multivitamin with minerals  1 tablet Oral Daily  . piperacillin-tazobactam (ZOSYN)  IV  3.375 g Intravenous Q8H  . sodium chloride  3 mL Intravenous Q12H  . vancomycin  1,000 mg Intravenous Q12H   Continuous Infusions: . sodium chloride 75 mL/hr at 10/22/13 1040  . sodium chloride 75 mL/hr at 10/24/13 [id number]    Principal Problem:   Osteomyelitis of left foot Active Problems:   DIABETES MELLITUS, TYPE II   HYPERTENSION   Leukocytosis   Anemia    Time spent: > 35 minutes    Velvet Bathe  [facility] Pager [phone] If 7PM-7AM, please contact night-coverage at [email or web address], password [facility] 10/25/2013, 12:41 PM  LOS: 4 days

## 2013-10-26 DIAGNOSIS — M869 Osteomyelitis, unspecified: Secondary | ICD-10-CM

## 2013-10-26 DIAGNOSIS — I739 Peripheral vascular disease, unspecified: Secondary | ICD-10-CM

## 2013-10-26 DIAGNOSIS — I70269 Atherosclerosis of native arteries of extremities with gangrene, unspecified extremity: Secondary | ICD-10-CM

## 2013-10-26 LAB — BASIC METABOLIC PANEL
CO2: 20 mEq/L (ref 19–32)
Calcium: 8.2 mg/dL — ABNORMAL LOW (ref 8.4–10.5)
Creatinine, Ser: 1.48 mg/dL — ABNORMAL HIGH (ref 0.50–1.35)
GFR calc Af Amer: 60 mL/min — ABNORMAL LOW (ref >90)
GFR calc non Af Amer: 52 mL/min — ABNORMAL LOW (ref >90)
Sodium: 128 mEq/L — ABNORMAL LOW (ref 135–145)

## 2013-10-26 LAB — CBC
HCT: 26.9 % — ABNORMAL LOW (ref 39.0–52.0)
Hemoglobin: 9.5 g/dL — ABNORMAL LOW (ref 13.0–17.0)
MCH: 30.5 pg (ref 26.0–34.0)
MCHC: 35.3 g/dL (ref 30.0–36.0)
RDW: 11.9 % (ref 11.5–15.5)

## 2013-10-26 LAB — FERRITIN: Ferritin: 910 ng/mL — ABNORMAL HIGH (ref 22–322)

## 2013-10-26 LAB — VITAMIN B12: Vitamin B-12: 1845 pg/mL — ABNORMAL HIGH (ref 211–911)

## 2013-10-26 LAB — VANCOMYCIN, TROUGH: Vancomycin Tr: 19.4 ug/mL (ref 10.0–20.0)

## 2013-10-26 NOTE — Progress Notes (Signed)
Subjective: The patient states that he is ready to get something done at this point.   Objective: Vital signs in last 24 hours: Temp:  [97.3 F (36.3 C)-98.2 F (36.8 C)] 98.2 F (36.8 C) (12/15 0533) Pulse Rate:  [59-90] 90 (12/15 0533) Resp:  [18] 18 (12/15 0533) BP: (158-190)/(69-97) 179/97 mmHg (12/15 0533) SpO2:  [96 %-100 %] 96 % (12/15 0533) Weight:  [113.898 kg (251 lb 1.6 oz)] 113.898 kg (251 lb 1.6 oz) (12/15 0533)  Intake/Output from previous day: 12/14 0701 - 12/15 0700 In: 3310 [P.O.:960; I.V.:1800; IV Piggyback:550] Out: 2875 [Urine:2875] Intake/Output this shift:     Recent Labs  10/25/13 0623 10/26/13 0520  HGB 9.0* 9.5*    Recent Labs  10/25/13 0623 10/25/13 1636 10/26/13 0520  WBC 10.5  --  11.1*  RBC 2.90* 3.32* 3.11*  HCT 24.9*  --  26.9*  PLT 302  --  332    Recent Labs  10/25/13 0623 10/26/13 0520  NA 127* 128*  K 4.3 4.5  CL 98 96  CO2 21 20  BUN 16 13  CREATININE 1.47* 1.48*  GLUCOSE 188* 95  CALCIUM 7.9* 8.2*   No results found for this basename: LABPT, INR,  in the last 72 hours  EXAM: Wound shows the dead fourth toe and some necrotic tissue around the distal portion of the wound.  I don't think his progress and all of the last several days in fact I think is a little better. Assessment/Plan: At this point think the appropriate course of action is for him to get revascularized to improve blood supply.  I agree completely with Dr. Mason Jim assessment in that I would only amputate the fourth toe and protect Is a little bit of the fourth metatarsal head just enough to allow the wound to close.  I predict it will go on to heal at that point.  If additional workup is needed and I'll be happy to be involved in his care.  I would follow him as an outpatient once his revascularization, toe amputation, and wound debridement is completed.    Merina Behrendt L 10/26/2013, 8:31 AM

## 2013-10-26 NOTE — Progress Notes (Signed)
Report called to [facility] to [employer], Therapist, sports. Pt is aware of transfer and is stable at this time except slight elevation in sbp 190s. Pt has been given prn hydralazine and [facility] RNs aware.

## 2013-10-26 NOTE — Progress Notes (Signed)
  VASCULAR PROGRESS NOTE  SUBJECTIVE: No specific complaints.  PHYSICAL EXAM: Filed Vitals:   10/25/13 2143 10/26/13 0533 10/26/13 0800 10/26/13 1225  BP: 158/69 179/97 168/72 194/89  Pulse: 59 90 82 74  Temp: 97.7 F (36.5 C) 98.2 F (36.8 C)    TempSrc: Oral Oral    Resp: 18 18 16 18   Height:      Weight:  251 lb 1.6 oz (113.898 kg)    SpO2: 97% 96% 98% 96%   Dressing with moderate drainage.  LABS: Lab Results  Component Value Date   WBC 11.1* 10/26/2013   HGB 9.5* 10/26/2013   HCT 26.9* 10/26/2013   MCV 86.5 10/26/2013   PLT 332 10/26/2013   Lab Results  Component Value Date   CREATININE 1.48* 10/26/2013   Lab Results  Component Value Date   INR 1.25 10/21/2013   CBG (last 3)   Recent Labs  10/24/13 0726 10/24/13 2050 10/25/13 0737  GLUCAP 134* 136* 139*    Principal Problem:   Osteomyelitis of left foot Active Problems:   DIABETES MELLITUS, TYPE II   HYPERTENSION   Leukocytosis   Anemia  ASSESSMENT AND PLAN:  *  I have recommended that we proceed with left femoral to posterior tibial bypass tomorrow hopefully with a vein graft. We will also amputate the left fourth toe and debridement devitalized tissue leaving the wound open. He may subsequently require further orthopedic work on his foot in the future. I have reviewed the indications for lower extremity bypass. I have also reviewed the potential complications of surgery including but not limited to: wound healing problems, infection, graft thrombosis, limb loss, or other unpredictable medical problems. All the patient's questions were answered and they are agreeable to proceed.   Gae Gallop [id number] 10/26/2013

## 2013-10-26 NOTE — Progress Notes (Signed)
ANTIBIOTIC CONSULT NOTE - FOLLOW UP  Pharmacy Consult for Vancomycin, Zosyn Indication: Gangrenous diabetic foot wound  Allergies  Allergen Reactions  . Sulfa Antibiotics Rash    Patient Measurements: Height: 6' (182.9 cm) Weight: 251 lb 1.6 oz (113.898 kg) IBW/kg (Calculated) : 77.6  Vital Signs: Temp: 98.2 F (36.8 C) (12/15 0533) Temp src: Oral (12/15 0533) BP: 179/97 mmHg (12/15 0533) Pulse Rate: 90 (12/15 0533) Intake/Output from previous day: 12/14 0701 - 12/15 0700 In: 3310 [P.O.:960; I.V.:1800; IV Piggyback:550] Out: 2875 [Urine:2875] Intake/Output from this shift:    Labs:  Recent Labs  10/24/13 1334 10/25/13 0623 10/26/13 0520  WBC  --  10.5 11.1*  HGB  --  9.0* 9.5*  PLT  --  302 332  CREATININE 1.53* 1.47* 1.48*   Estimated Creatinine Clearance: 74.3 ml/min (by C-G formula based on Cr of 1.48). 57 ml/min/1.9m2 (normalized)  Recent Labs  10/23/13 1555 10/26/13 0520  VANCOTROUGH 16.6 19.4     Microbiology: Recent Results (from the past 720 hour(s))  URINE CULTURE     Status: None   Collection Time    10/22/13  3:10 PM      Result Value Range Status   Specimen Description URINE, RANDOM   Final   Special Requests NONE   Final   Culture  Setup Time     Final   Value: 10/22/2013 21:54     Performed at [place]     Final   Value: NO GROWTH     Performed at [facility]   Culture     Final   Value: NO GROWTH     Performed at [facility]   Report Status 10/23/2013 FINAL   Final   Anti-infectives: 12/10 >> Vanc >> 12/10 >> Zosyn >>  Assessment: 52yo male with poorly controlled diabetes and recent left 5th toe amputation due to osteomyelitis presents [facility] with gangrenous foot/left fourth toe and fever. Pharmacy consulted to dose vancomycin and zoysn. Vascular surgery consulted, plan for infrainguinal bypass and 4th toe amp on Tuesday.  Day #5 Vancomycin 1g q12h and Zosyn 3.375g q8h extended  infusions  Renal: Scr rising daily from admit, now leveling off, UOP ok ~68ml/kg/hr  Vancomycin trough therapeutic, monitoring closely for accumulation with rising SCr  Goal of Therapy:  Vancomycin trough level 15-20 mcg/ml Appropriate abx dosing, eradication of infection.   Plan:   Continue Zosyn 3.375g IV Q8H infused over 4hrs.   Continue Vancomycin 1g IV q12h.  Follow up renal fxn and culture results.   Peggyann Juba, PharmD, BCPS Pager: [phone]  10/26/2013 8:06 AM

## 2013-10-26 NOTE — Progress Notes (Signed)
[facility] PROGRESS NOTE  Alexander [name] [id number] DOB: 08/10/1961 DOA: 10/21/2013 PCP: No primary provider on file.  Brief Narrative: Patient is a 52 year old Caucasian male with history of diabetes on oral hypoglycemic agents, hypertension, and history of left foot ulcer. Patient approximately one month ago status post amputation of fifth ray but since operation his wound has not healed well and patient presented with dusky fourth toe and open and draining foul-smelling wound. Was found to have osteomyelitis. Orthopedic surgeon and vascular surgeon on board and plans are for further treatment from a surgical standpoint at [facility].  Assessment/Plan: Osteomyelitis of left foot  - Orthopedic surgeon and vascular surgeon on board. Patient has had extensive work up for the best recommendations given his current condition.  - Please refer to orthopedic surgeons last note on 10/26/2013 for surgical plans from orthopedic surgeon standpoint - Please refer to vascular surgeons last note on 10/26/2013 for surgical plans from his standpoint - Continue broad-spectrum IV antibiotics at this point  Active Problems:  DIABETES MELLITUS, TYPE II  - Will continue amaryl and januvia - Blood sugars relatively well controlled currently  HYPERTENSION  - continue lisinopril and metoprolol  - Continue hydralazine 10 mg every 6 hours IV PRN for better BP control  - Will continue current regimen  Hyponatremia  - Most likely due to poor oral solid intake - Improved with IV fluids as such will continue at this juncture  Leukocytosis  - secondary to left foot osteomyelitis  - continue to monitor WBC count - Continue IV antibiotics vancomycin and Zosyn  Anemia  - Will obtain anemia panel and reassess levels next a.m.  Code Status: full Family Communication: Discussed at length with family (wife and daughters)  Disposition Plan: IV antibiotics with transfer to [place] likely Monday for  Vascular procedure most likely on Tuesday.   Consultants:  Orthopaedic surgery  Vascular surgery  Procedures:  ABI  MRI of left foot  Arteriogram Pending most likely 10/23/13  Antibiotics:  Vancomycin and zosyn  HPI/Subjective: No new complaints. No acute issues reported overnight  Objective: Filed Vitals:   10/26/13 0533  BP: 179/97  Pulse: 90  Temp: 98.2 F (36.8 C)  Resp: 18    Intake/Output Summary (Last 24 hours) at 10/26/13 1043 Last data filed at 10/26/13 0900  Gross per 24 hour  Intake   3020 ml  Output   3675 ml  Net   -655 ml   Filed Weights   10/25/13 0413 10/25/13 0658 10/26/13 0533  Weight: 113.172 kg (249 lb 8 oz) 111.993 kg (246 lb 14.4 oz) 113.898 kg (251 lb 1.6 oz)    Exam:   General:  Pt in NAD, Alert and Awake  Cardiovascular: RRR, no MRG  Respiratory: CTA BL, no wheezes  Abdomen: soft, NT, ND  Musculoskeletal: no cyanosis or clubbing. Patient has ulcer at left foot  Data Reviewed: Basic Metabolic Panel:  Recent Labs Lab 10/21/13 1545 10/21/13 2007 10/22/13 0525 10/24/13 1334 10/25/13 0623 10/26/13 0520  NA 116* 119* 122* 124* 127* 128*  K 5.0 4.5 4.4 4.3 4.3 4.5  CL 86* 88* 89* 92* 98 96  CO2 19 22 20 19 21 20   GLUCOSE 141* 123* 78 153* 188* 95  BUN 14 15 17 20 16 13   CREATININE 0.89 0.93 1.19 1.53* 1.47* 1.48*  CALCIUM 8.6 8.5 8.4 8.1* 7.9* 8.2*  MG  --  2.1  --   --   --   --   PHOS  --  4.5  --   --   --   --    Liver Function Tests:  Recent Labs Lab 10/21/13 2007  AST 20  ALT 26  ALKPHOS 80  BILITOT 0.5  PROT 6.0  ALBUMIN 2.4*   No results found for this basename: LIPASE, AMYLASE,  in the last 168 hours No results found for this basename: AMMONIA,  in the last 168 hours CBC:  Recent Labs Lab 10/21/13 1545 10/21/13 2007 10/22/13 0525 10/25/13 0623 10/26/13 0520  WBC 12.5* 12.9* 10.8* 10.5 11.1*  NEUTROABS  --  10.3*  --   --   --   HGB 10.4* 10.2* 10.3* 9.0* 9.5*  HCT 28.5* 27.6* 27.9*  24.9* 26.9*  MCV 85.6 85.2 85.8 85.9 86.5  PLT 266 275 281 302 332   Cardiac Enzymes: No results found for this basename: CKTOTAL, CKMB, CKMBINDEX, TROPONINI,  in the last 168 hours BNP (last 3 results) No results found for this basename: PROBNP,  in the last 8760 hours CBG:  Recent Labs Lab 10/23/13 0741 10/23/13 2034 10/24/13 0726 10/24/13 2050 10/25/13 0737  GLUCAP 119* 145* 134* 136* 139*    Recent Results (from the past 240 hour(s))  URINE CULTURE     Status: None   Collection Time    10/22/13  3:10 PM      Result Value Range Status   Specimen Description URINE, RANDOM   Final   Special Requests NONE   Final   Culture  Setup Time     Final   Value: 10/22/2013 21:54     Performed at [place]     Final   Value: NO GROWTH     Performed at [facility]   Culture     Final   Value: NO GROWTH     Performed at [facility]   Report Status 10/23/2013 FINAL   Final     Studies: No results found.  Scheduled Meds: . aspirin EC  81 mg Oral Daily  . glimepiride  4 mg Oral QAC breakfast  . linagliptin  5 mg Oral Q supper   And  . metFORMIN  2,000 mg Oral Q supper  . lisinopril  20 mg Oral BID  . metoprolol succinate  100 mg Oral Daily  . multivitamin with minerals  1 tablet Oral Daily  . piperacillin-tazobactam (ZOSYN)  IV  3.375 g Intravenous Q8H  . sodium chloride  3 mL Intravenous Q12H  . vancomycin  1,000 mg Intravenous Q12H   Continuous Infusions: . sodium chloride 75 mL/hr at 10/22/13 1040  . sodium chloride 75 mL/hr at 10/25/13 2146    Principal Problem:   Osteomyelitis of left foot Active Problems:   DIABETES MELLITUS, TYPE II   HYPERTENSION   Leukocytosis   Anemia    Time spent: > 35 minutes    Velvet Bathe  [facility] Pager [phone] If 7PM-7AM, please contact night-coverage at [email or web address], password [facility] 10/26/2013, 10:43 AM  LOS: 5 days

## 2013-10-27 ENCOUNTER — Encounter (HOSPITAL_COMMUNITY)
Admission: EM | Disposition: A | Discharge status: Home / Self Care | Payer: Self-pay | Source: Home / Self Care | Attending: Internal Medicine

## 2013-10-27 ENCOUNTER — Inpatient Hospital Stay (HOSPITAL_COMMUNITY): Payer: Medicaid Other | Admitting: Anesthesiology

## 2013-10-27 ENCOUNTER — Inpatient Hospital Stay (HOSPITAL_COMMUNITY): Payer: Medicaid Other

## 2013-10-27 ENCOUNTER — Encounter (HOSPITAL_COMMUNITY): Payer: Self-pay | Admitting: Certified Registered"

## 2013-10-27 ENCOUNTER — Encounter (HOSPITAL_COMMUNITY): Payer: Medicaid Other | Admitting: Anesthesiology

## 2013-10-27 DIAGNOSIS — I739 Peripheral vascular disease, unspecified: Secondary | ICD-10-CM | POA: Diagnosis present

## 2013-10-27 HISTORY — PX: AMPUTATION: SHX166

## 2013-10-27 HISTORY — PX: FEMORAL-TIBIAL BYPASS GRAFT: SHX938

## 2013-10-27 HISTORY — PX: I & D EXTREMITY: SHX5045

## 2013-10-27 LAB — CBC
MCHC: 35.5 g/dL (ref 30.0–36.0)
Platelets: 366 10*3/uL (ref 150–400)
RBC: 3.11 MIL/uL — ABNORMAL LOW (ref 4.22–5.81)
RDW: 12.2 % (ref 11.5–15.5)
WBC: 10.5 10*3/uL (ref 4.0–10.5)

## 2013-10-27 LAB — CREATININE, SERUM: GFR calc Af Amer: 63 mL/min — ABNORMAL LOW (ref >90)

## 2013-10-27 LAB — GLUCOSE, CAPILLARY
Glucose-Capillary: 139 mg/dL — ABNORMAL HIGH (ref 70–99)
Glucose-Capillary: 163 mg/dL — ABNORMAL HIGH (ref 70–99)
Glucose-Capillary: 192 mg/dL — ABNORMAL HIGH (ref 70–99)

## 2013-10-27 LAB — PREPARE RBC (CROSSMATCH)

## 2013-10-27 LAB — ABO/RH: ABO/RH(D): A POS

## 2013-10-27 SURGERY — CREATION, BYPASS, ARTERIAL, FEMORAL TO TIBIAL, USING GRAFT
Anesthesia: General | Site: Toe | Laterality: Left

## 2013-10-27 MED ORDER — PHENOL 1.4 % MT LIQD: 1.0000 | OROMUCOSAL | Status: DC | PRN

## 2013-10-27 MED ORDER — LABETALOL HCL 5 MG/ML IV SOLN
10.0000 mg | INTRAVENOUS | Status: DC | PRN
  Administered 2013-10-31 (×2): 10 mg via INTRAVENOUS
  Filled 2013-10-27 (×3): qty 4

## 2013-10-27 MED ORDER — PAPAVERINE HCL 30 MG/ML IJ SOLN
INTRAMUSCULAR | Status: DC | PRN
  Administered 2013-10-27: 60 mg via INTRAVENOUS

## 2013-10-27 MED ORDER — PANTOPRAZOLE SODIUM 40 MG PO TBEC
40.0000 mg | DELAYED_RELEASE_TABLET | Freq: Every day | ORAL | Status: DC
  Administered 2013-10-27 – 2013-11-09 (×13): 40 mg via ORAL
  Filled 2013-10-27 (×13): qty 1

## 2013-10-27 MED ORDER — DOCUSATE SODIUM 100 MG PO CAPS
100.0000 mg | ORAL_CAPSULE | Freq: Every day | ORAL | Status: DC
  Administered 2013-10-28 – 2013-11-06 (×9): 100 mg via ORAL
  Filled 2013-10-27 (×14): qty 1

## 2013-10-27 MED ORDER — ROCURONIUM BROMIDE 100 MG/10ML IV SOLN
INTRAVENOUS | Status: DC | PRN
  Administered 2013-10-27: 50 mg via INTRAVENOUS

## 2013-10-27 MED ORDER — DEXTROSE 5 % IV SOLN: 1.5000 g | Freq: Two times a day (BID) | INTRAVENOUS | Status: DC

## 2013-10-27 MED ORDER — SENNOSIDES-DOCUSATE SODIUM 8.6-50 MG PO TABS
1.0000 | ORAL_TABLET | Freq: Every evening | ORAL | Status: DC | PRN
  Filled 2013-10-27: qty 1

## 2013-10-27 MED ORDER — ALUM & MAG HYDROXIDE-SIMETH 200-200-20 MG/5ML PO SUSP: 15.0000 mL | ORAL | Status: DC | PRN

## 2013-10-27 MED ORDER — HYDROMORPHONE HCL PF 1 MG/ML IJ SOLN
0.2500 mg | INTRAMUSCULAR | Status: DC | PRN
  Administered 2013-10-27 (×2): 0.5 mg via INTRAVENOUS

## 2013-10-27 MED ORDER — POTASSIUM CHLORIDE CRYS ER 20 MEQ PO TBCR: 20.0000 meq | EXTENDED_RELEASE_TABLET | Freq: Once | ORAL | Status: AC | PRN

## 2013-10-27 MED ORDER — FENTANYL CITRATE 0.05 MG/ML IJ SOLN
INTRAMUSCULAR | Status: DC | PRN
  Administered 2013-10-27 (×3): 50 ug via INTRAVENOUS
  Administered 2013-10-27: 150 ug via INTRAVENOUS
  Administered 2013-10-27: 50 ug via INTRAVENOUS

## 2013-10-27 MED ORDER — ALBUMIN HUMAN 5 % IV SOLN
INTRAVENOUS | Status: DC | PRN
  Administered 2013-10-27: 11:00:00 via INTRAVENOUS

## 2013-10-27 MED ORDER — NEOSTIGMINE METHYLSULFATE 1 MG/ML IJ SOLN
INTRAMUSCULAR | Status: DC | PRN
  Administered 2013-10-27: 3 mg via INTRAVENOUS

## 2013-10-27 MED ORDER — LIDOCAINE HCL (CARDIAC) 20 MG/ML IV SOLN
INTRAVENOUS | Status: DC | PRN
  Administered 2013-10-27: 80 mg via INTRAVENOUS

## 2013-10-27 MED ORDER — SODIUM CHLORIDE 0.9 % IV SOLN: 500.0000 mL | Freq: Once | INTRAVENOUS | Status: AC | PRN

## 2013-10-27 MED ORDER — THROMBIN 20000 UNITS EX SOLR
CUTANEOUS | Status: AC
  Filled 2013-10-27: qty 20000

## 2013-10-27 MED ORDER — GUAIFENESIN-DM 100-10 MG/5ML PO SYRP
15.0000 mL | ORAL_SOLUTION | ORAL | Status: DC | PRN
  Filled 2013-10-27: qty 15

## 2013-10-27 MED ORDER — PROTAMINE SULFATE 10 MG/ML IV SOLN
INTRAVENOUS | Status: DC | PRN
  Administered 2013-10-27: 20 mg via INTRAVENOUS
  Administered 2013-10-27: 10 mg via INTRAVENOUS

## 2013-10-27 MED ORDER — BACITRACIN ZINC 500 UNIT/GM EX OINT
TOPICAL_OINTMENT | CUTANEOUS | Status: AC
  Filled 2013-10-27: qty 15

## 2013-10-27 MED ORDER — FLEET ENEMA 7-19 GM/118ML RE ENEM: 1.0000 | ENEMA | Freq: Once | RECTAL | Status: AC | PRN

## 2013-10-27 MED ORDER — HEPARIN SODIUM (PORCINE) 1000 UNIT/ML IJ SOLN
INTRAMUSCULAR | Status: DC | PRN
  Administered 2013-10-27: 10000 [IU] via INTRAVENOUS

## 2013-10-27 MED ORDER — ONDANSETRON HCL 4 MG/2ML IJ SOLN: 4.0000 mg | Freq: Four times a day (QID) | INTRAMUSCULAR | Status: DC | PRN

## 2013-10-27 MED ORDER — HYDRALAZINE HCL 20 MG/ML IJ SOLN
10.0000 mg | INTRAMUSCULAR | Status: DC | PRN
  Administered 2013-10-30 – 2013-10-31 (×3): 10 mg via INTRAVENOUS
  Filled 2013-10-27 (×2): qty 1

## 2013-10-27 MED ORDER — METOPROLOL TARTRATE 1 MG/ML IV SOLN: 2.0000 mg | INTRAVENOUS | Status: DC | PRN

## 2013-10-27 MED ORDER — HYDROMORPHONE HCL PF 1 MG/ML IJ SOLN
INTRAMUSCULAR | Status: AC
  Administered 2013-10-27: 0.5 mg via INTRAVENOUS
  Filled 2013-10-27: qty 1

## 2013-10-27 MED ORDER — INSULIN ASPART 100 UNIT/ML ~~LOC~~ SOLN
0.0000 [IU] | Freq: Three times a day (TID) | SUBCUTANEOUS | Status: DC
  Administered 2013-10-27 – 2013-10-29 (×4): 3 [IU] via SUBCUTANEOUS
  Administered 2013-10-29: 2 [IU] via SUBCUTANEOUS
  Administered 2013-10-30: 3 [IU] via SUBCUTANEOUS
  Administered 2013-10-31 – 2013-11-02 (×5): 2 [IU] via SUBCUTANEOUS
  Administered 2013-11-02 – 2013-11-03 (×3): 3 [IU] via SUBCUTANEOUS
  Administered 2013-11-03: 2 [IU] via SUBCUTANEOUS
  Administered 2013-11-03: 3 [IU] via SUBCUTANEOUS
  Administered 2013-11-04 (×2): 2 [IU] via SUBCUTANEOUS
  Administered 2013-11-05: 3 [IU] via SUBCUTANEOUS
  Administered 2013-11-05: 2 [IU] via SUBCUTANEOUS
  Administered 2013-11-05 – 2013-11-06 (×2): 3 [IU] via SUBCUTANEOUS
  Administered 2013-11-06: 5 [IU] via SUBCUTANEOUS
  Administered 2013-11-06: 2 [IU] via SUBCUTANEOUS
  Administered 2013-11-07: 3 [IU] via SUBCUTANEOUS
  Administered 2013-11-07: 8 [IU] via SUBCUTANEOUS
  Administered 2013-11-07 – 2013-11-08 (×3): 3 [IU] via SUBCUTANEOUS
  Administered 2013-11-08 – 2013-11-09 (×2): 5 [IU] via SUBCUTANEOUS
  Administered 2013-11-09: 3 [IU] via SUBCUTANEOUS

## 2013-10-27 MED ORDER — DOPAMINE-DEXTROSE 3.2-5 MG/ML-% IV SOLN: 3.0000 ug/kg/min | INTRAVENOUS | Status: DC

## 2013-10-27 MED ORDER — PROPOFOL 10 MG/ML IV BOLUS
INTRAVENOUS | Status: DC | PRN
  Administered 2013-10-27: 200 mg via INTRAVENOUS

## 2013-10-27 MED ORDER — 0.9 % SODIUM CHLORIDE (POUR BTL) OPTIME
TOPICAL | Status: DC | PRN
  Administered 2013-10-27: 2000 mL

## 2013-10-27 MED ORDER — SODIUM CHLORIDE 0.9 % IR SOLN
Status: DC | PRN
  Administered 2013-10-27 (×2)

## 2013-10-27 MED ORDER — SODIUM CHLORIDE 0.9 % IV SOLN
INTRAVENOUS | Status: DC
  Administered 2013-10-27: 75 mL/h via INTRAVENOUS

## 2013-10-27 MED ORDER — MIDAZOLAM HCL 5 MG/5ML IJ SOLN
INTRAMUSCULAR | Status: DC | PRN
  Administered 2013-10-27: 2 mg via INTRAVENOUS

## 2013-10-27 MED ORDER — GLYCOPYRROLATE 0.2 MG/ML IJ SOLN
INTRAMUSCULAR | Status: DC | PRN
  Administered 2013-10-27: 0.4 mg via INTRAVENOUS

## 2013-10-27 MED ORDER — PAPAVERINE HCL 30 MG/ML IJ SOLN
INTRAMUSCULAR | Status: AC
  Filled 2013-10-27: qty 2

## 2013-10-27 MED ORDER — OXYCODONE-ACETAMINOPHEN 5-325 MG PO TABS
1.0000 | ORAL_TABLET | ORAL | Status: DC | PRN
  Administered 2013-10-29 – 2013-11-05 (×11): 2 via ORAL
  Filled 2013-10-27 (×11): qty 2

## 2013-10-27 MED ORDER — MAGNESIUM SULFATE 40 MG/ML IJ SOLN
2.0000 g | Freq: Once | INTRAMUSCULAR | Status: AC | PRN
  Filled 2013-10-27: qty 50

## 2013-10-27 MED ORDER — MORPHINE SULFATE 2 MG/ML IJ SOLN
2.0000 mg | INTRAMUSCULAR | Status: DC | PRN
  Administered 2013-10-27 – 2013-11-04 (×4): 4 mg via INTRAVENOUS
  Filled 2013-10-27 (×4): qty 2

## 2013-10-27 MED ORDER — ACETAMINOPHEN 325 MG PO TABS: 325.0000 mg | ORAL_TABLET | ORAL | Status: DC | PRN

## 2013-10-27 MED ORDER — BISACODYL 10 MG RE SUPP: 10.0000 mg | Freq: Every day | RECTAL | Status: DC | PRN

## 2013-10-27 MED ORDER — LACTATED RINGERS IV SOLN
INTRAVENOUS | Status: DC | PRN
  Administered 2013-10-27 (×3): via INTRAVENOUS

## 2013-10-27 MED ORDER — INSULIN ASPART 100 UNIT/ML ~~LOC~~ SOLN: 0.0000 [IU] | Freq: Three times a day (TID) | SUBCUTANEOUS | Status: DC

## 2013-10-27 MED ORDER — IOHEXOL 300 MG/ML  SOLN
INTRAMUSCULAR | Status: DC | PRN
  Administered 2013-10-27: 17 mL via INTRAVENOUS

## 2013-10-27 MED ORDER — ACETAMINOPHEN 650 MG RE SUPP: 325.0000 mg | RECTAL | Status: DC | PRN

## 2013-10-27 MED ORDER — ENOXAPARIN SODIUM 40 MG/0.4ML ~~LOC~~ SOLN
40.0000 mg | SUBCUTANEOUS | Status: DC
  Administered 2013-10-28: 40 mg via SUBCUTANEOUS
  Filled 2013-10-27 (×3): qty 0.4

## 2013-10-27 SURGICAL SUPPLY — 77 items
BANDAGE CONFORM 3  STR LF (GAUZE/BANDAGES/DRESSINGS) ×4 IMPLANT
BANDAGE ELASTIC 4 VELCRO ST LF (GAUZE/BANDAGES/DRESSINGS) ×4 IMPLANT
BANDAGE ELASTIC 6 VELCRO ST LF (GAUZE/BANDAGES/DRESSINGS) IMPLANT
BANDAGE ESMARK 6X9 LF (GAUZE/BANDAGES/DRESSINGS) IMPLANT
BANDAGE GAUZE ELAST BULKY 4 IN (GAUZE/BANDAGES/DRESSINGS) ×8 IMPLANT
BLADE AVERAGE 25X9 (BLADE) ×4 IMPLANT
BNDG ESMARK 6X9 LF (GAUZE/BANDAGES/DRESSINGS)
BNDG GAUZE ELAST 4 BULKY (GAUZE/BANDAGES/DRESSINGS) ×4 IMPLANT
CANISTER SUCTION 2500CC (MISCELLANEOUS) ×4 IMPLANT
CANNULA VESSEL 3MM 2 BLNT TIP (CANNULA) ×4 IMPLANT
CLIP TI MEDIUM 24 (CLIP) ×4 IMPLANT
CLIP TI WIDE RED SMALL 24 (CLIP) ×4 IMPLANT
COVER SURGICAL LIGHT HANDLE (MISCELLANEOUS) ×4 IMPLANT
CUFF TOURNIQUET SINGLE 18IN (TOURNIQUET CUFF) IMPLANT
CUFF TOURNIQUET SINGLE 24IN (TOURNIQUET CUFF) IMPLANT
CUFF TOURNIQUET SINGLE 34IN LL (TOURNIQUET CUFF) ×4 IMPLANT
CUFF TOURNIQUET SINGLE 44IN (TOURNIQUET CUFF) IMPLANT
DERMABOND ADVANCED (GAUZE/BANDAGES/DRESSINGS) ×2
DERMABOND ADVANCED .7 DNX12 (GAUZE/BANDAGES/DRESSINGS) ×6 IMPLANT
DRAIN CHANNEL 15F RND FF W/TCR (WOUND CARE) IMPLANT
DRAPE EXTREMITY T 121X128X90 (DRAPE) ×4 IMPLANT
DRAPE INCISE IOBAN 66X45 STRL (DRAPES) IMPLANT
DRAPE ORTHO SPLIT 77X108 STRL (DRAPES)
DRAPE PROXIMA HALF (DRAPES) ×4 IMPLANT
DRAPE SURG ORHT 6 SPLT 77X108 (DRAPES) IMPLANT
DRAPE WARM FLUID 44X44 (DRAPE) ×4 IMPLANT
DRAPE X-RAY CASS 24X20 (DRAPES) ×4 IMPLANT
ELECT REM PT RETURN 9FT ADLT (ELECTROSURGICAL) ×4
ELECTRODE REM PT RTRN 9FT ADLT (ELECTROSURGICAL) ×3 IMPLANT
EVACUATOR SILICONE 100CC (DRAIN) IMPLANT
GAUZE SPONGE 4X4 16PLY XRAY LF (GAUZE/BANDAGES/DRESSINGS) ×4 IMPLANT
GLOVE BIO SURGEON STRL SZ7.5 (GLOVE) ×16 IMPLANT
GLOVE BIOGEL PI IND STRL 7.5 (GLOVE) ×6 IMPLANT
GLOVE BIOGEL PI IND STRL 8 (GLOVE) ×9 IMPLANT
GLOVE BIOGEL PI INDICATOR 7.5 (GLOVE) ×2
GLOVE BIOGEL PI INDICATOR 8 (GLOVE) ×3
GLOVE SS BIOGEL STRL SZ 6.5 (GLOVE) ×6 IMPLANT
GLOVE SUPERSENSE BIOGEL SZ 6.5 (GLOVE) ×2
GLOVE SURG SS PI 7.0 STRL IVOR (GLOVE) ×8 IMPLANT
GOWN STRL NON-REIN LRG LVL3 (GOWN DISPOSABLE) ×12 IMPLANT
GOWN STRL REIN XL XLG (GOWN DISPOSABLE) ×16 IMPLANT
KIT BASIN OR (CUSTOM PROCEDURE TRAY) ×4 IMPLANT
KIT ROOM TURNOVER OR (KITS) ×4 IMPLANT
MARKER GRAFT CORONARY BYPASS (MISCELLANEOUS) ×4 IMPLANT
NS IRRIG 1000ML POUR BTL (IV SOLUTION) ×8 IMPLANT
PACK CV ACCESS (CUSTOM PROCEDURE TRAY) IMPLANT
PACK GENERAL/GYN (CUSTOM PROCEDURE TRAY) ×4 IMPLANT
PACK PERIPHERAL VASCULAR (CUSTOM PROCEDURE TRAY) ×4 IMPLANT
PAD ARMBOARD 7.5X6 YLW CONV (MISCELLANEOUS) ×8 IMPLANT
PADDING CAST COTTON 6X4 STRL (CAST SUPPLIES) IMPLANT
SET COLLECT BLD 21X3/4 12 (NEEDLE) ×4 IMPLANT
SPECIMEN JAR SMALL (MISCELLANEOUS) ×4 IMPLANT
SPONGE GAUZE 4X4 12PLY (GAUZE/BANDAGES/DRESSINGS) ×4 IMPLANT
SPONGE SURGIFOAM ABS GEL 100 (HEMOSTASIS) IMPLANT
STOPCOCK 4 WAY LG BORE MALE ST (IV SETS) ×4 IMPLANT
SUT ETHILON 3 0 PS 1 (SUTURE) ×4 IMPLANT
SUT PROLENE 5 0 C 1 24 (SUTURE) ×8 IMPLANT
SUT PROLENE 6 0 BV (SUTURE) ×16 IMPLANT
SUT PROLENE 7 0 BV 1 (SUTURE) IMPLANT
SUT SILK 2 0 (SUTURE) ×1
SUT SILK 2 0 FS (SUTURE) IMPLANT
SUT SILK 2 0 SH (SUTURE) ×4 IMPLANT
SUT SILK 2-0 18XBRD TIE 12 (SUTURE) ×3 IMPLANT
SUT SILK 3 0 (SUTURE) ×2
SUT SILK 3-0 18XBRD TIE 12 (SUTURE) ×6 IMPLANT
SUT VIC AB 2-0 CTB1 (SUTURE) ×8 IMPLANT
SUT VIC AB 3-0 SH 27 (SUTURE) ×3
SUT VIC AB 3-0 SH 27X BRD (SUTURE) ×9 IMPLANT
SUT VICRYL 4-0 PS2 18IN ABS (SUTURE) ×12 IMPLANT
SWAB COLLECTION DEVICE MRSA (MISCELLANEOUS) IMPLANT
TOWEL OR 17X24 6PK STRL BLUE (TOWEL DISPOSABLE) ×8 IMPLANT
TOWEL OR 17X26 10 PK STRL BLUE (TOWEL DISPOSABLE) ×8 IMPLANT
TRAY FOLEY CATH 16FRSI W/METER (SET/KITS/TRAYS/PACK) ×4 IMPLANT
TUBE ANAEROBIC SPECIMEN COL (MISCELLANEOUS) IMPLANT
TUBING EXTENTION W/L.L. (IV SETS) ×4 IMPLANT
UNDERPAD 30X30 INCONTINENT (UNDERPADS AND DIAPERS) ×4 IMPLANT
WATER STERILE IRR 1000ML POUR (IV SOLUTION) ×4 IMPLANT

## 2013-10-27 NOTE — Interval H&P Note (Signed)
History and Physical Interval Note:  10/27/2013 7:25 AM  Alexander [name]  has presented today for surgery, with the diagnosis of Peripheral Vascular Disease with Gangrenous wounds left foot  The various methods of treatment have been discussed with the patient and family. After consideration of risks, benefits and other options for treatment, the patient has consented to  Procedure(s): BYPASS GRAFT LEFT FEMORAL- POSTERIOR TIBIAL ARTERY (Left) IRRIGATION AND DEBRIDEMENT EXTREMITY- LEFT FOOT (Left) AMPUTATION DIGIT-LEFT 4TH TOE (Left) as a surgical intervention .  The patient's history has been reviewed, patient examined, no change in status, stable for surgery.  I have reviewed the patient's chart and labs.  Questions were answered to the patient's satisfaction.     DICKSON,[name]

## 2013-10-27 NOTE — Progress Notes (Signed)
[facility] PROGRESS NOTE  MARUIN EMBREE [id number] DOB: June 01, 1961 DOA: 10/21/2013 PCP: No primary provider on file.  Assessment/Plan: Osteomyelitis of left foot  - Orthopedic surgeon and vascular surgeon on board following - Continue broad-spectrum IV antibiotics at this point  Active Problems:  DIABETES MELLITUS, TYPE II  - Will continue amaryl and januvia - Blood sugars relatively well controlled currently  HYPERTENSION  - continue lisinopril and metoprolol  - Continue hydralazine 10 mg every 6 hours IV PRN for better BP control  - Will continue current regimen - May consider adding Ca channel blocker if bp still difficult to control  Hyponatremia  - Most likely due to poor oral solid intake - Improved with IV fluids  Leukocytosis  - secondary to left foot osteomyelitis  - continue to monitor WBC count - Continue IV antibiotics vancomycin and Zosyn  Anemia  - normocytic - Has remained stable thus far - Cont to monitor  Code Status: full Family Communication: Discussed at length with family (wife and daughters)  Disposition Plan: IV antibiotics with transfer to [place] likely Monday for Vascular procedure most likely on Tuesday.  Consultants:  Orthopaedic surgery  Vascular surgery  Procedures:  ABI  MRI of left foot  Arteriogram Pending most likely 10/23/13  Antibiotics:  Vancomycin and zosyn  HPI/Subjective: No new complaints. No acute issues reported overnight  Objective: Filed Vitals:   10/27/13 0520  BP: 186/83  Pulse: 80  Temp: 98 F (36.7 C)  Resp: 18    Intake/Output Summary (Last 24 hours) at 10/27/13 1408 Last data filed at 10/27/13 1315  Gross per 24 hour  Intake 3506.25 ml  Output   1080 ml  Net 2426.25 ml   Filed Weights   10/25/13 0413 10/25/13 0658 10/26/13 0533  Weight: 113.172 kg (249 lb 8 oz) 111.993 kg (246 lb 14.4 oz) 113.898 kg (251 lb 1.6 oz)    Exam:   General:  Pt in NAD, Alert and  Awake  Cardiovascular: RRR, no MRG  Respiratory: CTA BL, no wheezes  Abdomen: soft, NT, ND  Musculoskeletal: no cyanosis or clubbing. Patient has ulcer at left foot  Data Reviewed: Basic Metabolic Panel:  Recent Labs Lab 10/21/13 1545 10/21/13 2007 10/22/13 0525 10/24/13 1334 10/25/13 0623 10/26/13 0520  NA 116* 119* 122* 124* 127* 128*  [name] 5.0 4.5 4.4 4.3 4.3 4.5  CL 86* 88* 89* 92* 98 96  CO2 19 22 20 19 21 20   GLUCOSE 141* 123* 78 153* 188* 95  BUN 14 15 17 20 16 13   CREATININE 0.89 0.93 1.19 1.53* 1.47* 1.48*  CALCIUM 8.6 8.5 8.4 8.1* 7.9* 8.2*  MG  --  2.1  --   --   --   --   PHOS  --  4.5  --   --   --   --    Liver Function Tests:  Recent Labs Lab 10/21/13 2007  AST 20  ALT 26  ALKPHOS 80  BILITOT 0.5  PROT 6.0  ALBUMIN 2.4*   No results found for this basename: LIPASE, AMYLASE,  in the last 168 hours No results found for this basename: AMMONIA,  in the last 168 hours CBC:  Recent Labs Lab 10/21/13 2007 10/22/13 0525 10/25/13 0623 10/26/13 0520 10/27/13 0430  WBC 12.9* 10.8* 10.5 11.1* 10.5  NEUTROABS 10.3*  --   --   --   --   HGB 10.2* 10.3* 9.0* 9.5* 9.8*  HCT 27.6* 27.9* 24.9* 26.9* 27.6*  MCV 85.2 85.8 85.9 86.5 88.7  PLT 275 281 302 332 366   Cardiac Enzymes: No results found for this basename: CKTOTAL, CKMB, CKMBINDEX, TROPONINI,  in the last 168 hours BNP (last 3 results) No results found for this basename: PROBNP,  in the last 8760 hours CBG:  Recent Labs Lab 10/24/13 0726 10/24/13 2050 10/25/13 0737 10/26/13 2302 10/27/13 0540  GLUCAP 134* 136* 139* 117* 116*    Recent Results (from the past 240 hour(s))  URINE CULTURE     Status: None   Collection Time    10/22/13  3:10 PM      Result Value Range Status   Specimen Description URINE, RANDOM   Final   Special Requests NONE   Final   Culture  Setup Time     Final   Value: 10/22/2013 21:54     Performed at [place]     Final   Value: NO  GROWTH     Performed at [facility]   Culture     Final   Value: NO GROWTH     Performed at [facility]   Report Status 10/23/2013 FINAL   Final  MRSA PCR SCREENING     Status: None   Collection Time    10/26/13  9:51 PM      Result Value Range Status   MRSA by PCR NEGATIVE  NEGATIVE Final   Comment:            The GeneXpert MRSA Assay (FDA     approved for NASAL specimens     only), is one component of a     comprehensive MRSA colonization     surveillance program. It is not     intended to diagnose MRSA     infection nor to guide or     monitor treatment for     MRSA infections.     Studies: Dg Chest Portable 1 View  10/27/2013   CLINICAL DATA:  Rule out foreign object from surgery. Possible chipped tooth during induction.  EXAM: PORTABLE CHEST - 1 VIEW  COMPARISON:  08/17/2013  FINDINGS: Endotracheal tube tip projects approximately 3 cm above the carina. Multiple monitoring leads overlie the upper and mid chest. Lung volumes are mildly diminished with mild central pulmonary vascular congestion. There is oblong opacity in the retrocardiac left lung base. The cardiac silhouette is enlarged. No radiopaque foreign body is identified.  IMPRESSION: 1. No radiopaque foreign body identified in the chest. 2. Endotracheal tube in satisfactory position. 3. Mildly diminished lung volumes with central pulmonary vascular congestion and left basilar opacity, likely atelectasis.   Electronically Signed   By: Logan Bores   On: 10/27/2013 14:00   Dg Ang/ext/uni/or Left  10/27/2013   CLINICAL DATA:  Left lower extremity femoral to tibioperoneal trunk bypass for perform serial occlusive disease with nonhealing wound of left foot with osteomyelitis.  EXAM: LEFT ANG/EXT/UNI/ OR  TECHNIQUE: Intraoperative image was obtained.  CONTRAST:  See operative report.  COMPARISON:  Arteriography on 10/23/2013.  FLUOROSCOPY TIME:  See operative report.  FINDINGS: Lower leg image obtained  intraoperatively shows the distal and of a saphenous vein graft with anastomosis to the tibioperoneal trunk below the knee. Injected contrast opacified is the native anterior tibial artery and posterior tibial artery with flow identified to the level of the ankle. The peroneal artery is open but demonstrates significant segments of stenosis and disease.  IMPRESSION: Open distal bypass graft  with anastomosis to the tibioperoneal trunk below the knee. Anterior tibial and posterior tibial runoff is visualized. The peroneal artery is diffusely diseased.   Electronically Signed   By: Aletta Edouard M.D.   On: 10/27/2013 14:05    Scheduled Meds: . [facility] HOLD] aspirin EC  81 mg Oral Daily  . [MAR HOLD] glimepiride  4 mg Oral QAC breakfast  . [MAR HOLD] linagliptin  5 mg Oral Q supper   And  . [MAR HOLD] metFORMIN  2,000 mg Oral Q supper  . [MAR HOLD] lisinopril  20 mg Oral BID  . [facility] HOLD] metoprolol succinate  100 mg Oral Daily  . [MAR HOLD] multivitamin with minerals  1 tablet Oral Daily  . [MAR HOLD] piperacillin-tazobactam (ZOSYN)  IV  3.375 g Intravenous Q8H  . [MAR HOLD] sodium chloride  3 mL Intravenous Q12H  . [facility] HOLD] vancomycin  1,000 mg Intravenous Q12H   Continuous Infusions: . sodium chloride 75 mL/hr at 10/22/13 1040  . sodium chloride 75 mL/hr at 10/26/13 1233    Principal Problem:   Osteomyelitis of left foot Active Problems:   DIABETES MELLITUS, TYPE II   HYPERTENSION   Leukocytosis   Anemia   PAD (peripheral artery disease)  Time spent: > 35 minutes  Alexander [name]  [facility] Pager [phone] If 7PM-7AM, please contact night-coverage at [email or web address], password [facility] 10/27/2013, 2:08 PM  LOS: 6 days

## 2013-10-27 NOTE — Preoperative (Signed)
Beta Blockers   Patient took metoprolol 12/15 at 1000

## 2013-10-27 NOTE — Op Note (Signed)
NAME: Alexander [name]   MRN: [id number] DOB: 28-Apr-1961    DATE OF OPERATION: 10/27/2013  PREOP DIAGNOSIS: gangrene of the left foot with multilevel arterial occlusive disease  POSTOP DIAGNOSIS: same  PROCEDURE:  1. Left common femoral to tibial peroneal trunk bypass with non-reversed translocated saphenous vein graft 2. Intraoperative arteriogram 3. Ray amputation of the left fourth toe and debridement of lateral foot  SURGEON: Judeth Cornfield. Scot Dock, MD, FACS  ASSIST: Gerri Lins PA  ANESTHESIA: Gen.   EBL: 100 cc  INDICATIONS: Alexander [name] is a 52 y.o. male presenting with an extensive gangrenous wound of the left foot. He underwent a fifth toe amputation and this failed to heal. Initially it had normal Doppler studies however repeat Doppler studies were obtained when the foot with nonhealing suggested progression of disease on the left. Arteriogram showed multilevel arterial occlusive disease and it was felt that his best chance for limb salvage was attempted revascularization.  FINDINGS: this patient had diffuse disease throughout the common femoral artery, popliteal artery, and posterior tibial artery. I was able to find an adequate spot on the common femoral artery fairly high up on the artery. There was too much atherosclerotic disease in the below-knee popliteal artery and also in the posterior tibial artery. The tibial peroneal trunk was the arterial segment which was most amenable for the distal anastomosis. Completion arteriogram showed no time for problems with some spasm beyond the anastomosis. There was a brisk posterior tibial and dorsalis pedis signal at the completion the procedure. Of note, the greater saphenous vein was bifid.  TECHNIQUE: The patient was taken to the operating room and received a general anesthetic. The entire left foot and left lower extremity were prepped and draped in the usual sterile fashion. A longitudinal incision was made in the left groin.  Through this incision the superficial femoral artery was dissected free and was markedly diseased. The deep femoral artery was controlled with a vessel loop. I had to dissect up fairly high  Beneath the inguinal ligament in order to find an area of the distal external iliac artery that could be clamped. There was a soft spot on the common femoral artery slightly laterally and fairly high up the artery did appear to be adequate for the proximal anastomosis. Next greater saphenous vein was dissected free. The vein was bifid with multiple connecting branches. I selected the larger of the 2 veins. Through 3 additional incisions along the medial aspect of the left leg the greater saphenous vein was harvested from the groin to the midcalf. Again, the vein was bifid and I selected the largest channel throughout the course of the vein. Through the distal incision, dissection was carried down to the below-knee popliteal artery. The artery was significantly diseased and there was not an area that appeared to be good for the distal anastomosis. I therefore went down the posterior tibial artery by dividing the soleus muscle. This too was markedly diseased. I therefore explored the tibial peroneal trunk which did have a reasonable spot for the distal anastomosis. The vein was ligated distally and then removed from its tunnel. The saphenofemoral junction was clamped and the saphenous vein excised from the femoral vein. The femoral vein was oversewn with a 5-0 Prolene suture. The patient was heparinized.  Attention was then turned to the proximal anastomosis. The proximal valve in the vein was sharply excised. The external iliac artery was clamped. The deep femoral artery superficial femoral artery and multiple branches were controlled.  A longitudinal arteriotomy was made in the common femoral artery fairly high up slightly on the lateral aspect of the artery where the soft spot in the artery was. The vein was sewn end to side  in a non-reverse fashion with continuous 5-0 Prolene suture. Prior to completing this anastomosis the artery was backbled and flushed and there was excellent inflow. The anastomosis was completed. Of note a radiopaque marker was placed around the proximal anastomosis. Next using a retrograde [place] valvulotome the valves were lysed. Sof-Flo was established the graft was then flushed with heparinized saline and then clamped. The vein was then brought through the previous skin incisions after the vein had been marked to prevent twisting. A tourniquet was placed on the thigh and the leg exsanguinated with an Esmarch bandage. The tourniquet was inflated to 300 mm of mercury. Under tourniquet control, a longitudinal arteriotomy was made in the tibial peroneal trunk. The vein was cut to the appropriate length, spatulated, and sewn end to side to the tibial peroneal trunk using continuous 6-0 Prolene suture. Prior to completing this anastomosis, the tourniquet was released, and the arteries were backbled and flushed appropriately and the anastomosis completed. Flow was reestablished to the left leg. It was a good posterior tibial signal at this point and a good dorsalis pedis signal. The heparin was partially reversed with protamine. The groin incision was closed with 2 deep layers of 2-0 Vicryl after hemostasis was attained. The subcutaneous layer was closed with 3-0 Vicryl. Skin was closed with 4-0 Vicryl. The remaining 3 incisions were closed the deep layer of 3-0 Vicryl, subcutaneous layer with 3-0 Vicryl, and the skin closed with 4-0 subcuticular stitch. Dermabond was applied.  Next attention was turned to the left foot. The fourth toe was clearly gangrenous. It was also a extensive wound on the lateral aspect of the left foot in the midportion of the foot. An elliptical incision was made encompassing this wound also the fourth toe. Dissection was carried down to the fourth metatarsal which was dissected free and  divided with a TPS saw. Then carried the dissection on the lateral aspect of the foot down to the bone which was also divided using the saw. There was good bleeding posteriorly. There was some tissue adjacent to the third toe at the base the toe which did not appear viable. However, the decision had been made that we would reperfuse the foot and then follow the wound to allow for as much limb salvage is possible. Was felt that he would likely require further work on the foot once the wound had declared itself. There was too much tension to close any of the wound posteriorly. Wound is not clean enough for placement of a VAC. Hemostasis was obtained in the wound the wound is then packed with moist 4 x 4's and then wrapped with Kerlix and four-inch Ace. The patient tolerated the procedure well and transferred to recovery in stable condition. All needles sponge counts were correct.  [name], MD, FACS [facility]  DATE OF DICTATION:   10/27/2013

## 2013-10-27 NOTE — Progress Notes (Signed)
  VASCULAR POSTOP NOTE  SUBJECTIVE: No complaints.   PHYSICAL EXAM: Filed Vitals:   10/27/13 1530 10/27/13 1545 10/27/13 1600 10/27/13 1615  BP: 138/58  159/56 197/82  Pulse: 82 81 80   Temp:   97.4 F (36.3 C) 96.3 F (35.7 C)  TempSrc:    Axillary  Resp: 24 15 14    Height:      Weight:      SpO2: 100% 100% 100% 100%   Good doppler signals DP and PT Incisions fine.  LABS: Lab Results  Component Value Date   WBC 10.5 10/27/2013   HGB 9.8* 10/27/2013   HCT 27.6* 10/27/2013   MCV 88.7 10/27/2013   PLT 366 10/27/2013   Lab Results  Component Value Date   CREATININE 1.48* 10/26/2013   Lab Results  Component Value Date   INR 1.25 10/21/2013   CBG (last 3)   Recent Labs  10/27/13 0540 10/27/13 1411 10/27/13 1643  GLUCAP 116* 139* 163*    Principal Problem:   Osteomyelitis of left foot Active Problems:   DIABETES MELLITUS, TYPE II   HYPERTENSION   Leukocytosis   Anemia   PAD (peripheral artery disease)   ASSESSMENT AND PLAN:  Doing well post op.  Start dressing changes tomorrow.  Will likely need further surgery on the foot once it is cleaner.    Gae Gallop [id number] 10/27/2013

## 2013-10-27 NOTE — H&P (View-Only) (Signed)
  VASCULAR PROGRESS NOTE  SUBJECTIVE: No specific complaints.  PHYSICAL EXAM: Filed Vitals:   10/25/13 2143 10/26/13 0533 10/26/13 0800 10/26/13 1225  BP: 158/69 179/97 168/72 194/89  Pulse: 59 90 82 74  Temp: 97.7 F (36.5 C) 98.2 F (36.8 C)    TempSrc: Oral Oral    Resp: 18 18 16 18   Height:      Weight:  251 lb 1.6 oz (113.898 kg)    SpO2: 97% 96% 98% 96%   Dressing with moderate drainage.  LABS: Lab Results  Component Value Date   WBC 11.1* 10/26/2013   HGB 9.5* 10/26/2013   HCT 26.9* 10/26/2013   MCV 86.5 10/26/2013   PLT 332 10/26/2013   Lab Results  Component Value Date   CREATININE 1.48* 10/26/2013   Lab Results  Component Value Date   INR 1.25 10/21/2013   CBG (last 3)   Recent Labs  10/24/13 0726 10/24/13 2050 10/25/13 0737  GLUCAP 134* 136* 139*    Principal Problem:   Osteomyelitis of left foot Active Problems:   DIABETES MELLITUS, TYPE II   HYPERTENSION   Leukocytosis   Anemia  ASSESSMENT AND PLAN:  *  I have recommended that we proceed with left femoral to posterior tibial bypass tomorrow hopefully with a vein graft. We will also amputate the left fourth toe and debridement devitalized tissue leaving the wound open. He may subsequently require further orthopedic work on his foot in the future. I have reviewed the indications for lower extremity bypass. I have also reviewed the potential complications of surgery including but not limited to: wound healing problems, infection, graft thrombosis, limb loss, or other unpredictable medical problems. All the patient's questions were answered and they are agreeable to proceed.   Gae Gallop [id number] 10/26/2013

## 2013-10-27 NOTE — Transfer of Care (Signed)
Immediate Anesthesia Transfer of Care Note  Patient: Alexander [name]  Procedure(s) Performed: Procedure(s): BYPASS GRAFT LEFT FEMORAL- POSTERIOR TIBIAL ARTERY (Left) IRRIGATION AND DEBRIDEMENT EXTREMITY- LEFT FOOT (Left) AMPUTATION DIGIT-LEFT 4TH TOE, 4th and 5th metatarsal. (Left)  Patient Location: PACU  Anesthesia Type:General  Level of Consciousness: sedated and patient cooperative  Airway & Oxygen Therapy: Patient Spontanous Breathing and Patient connected to nasal cannula oxygen  Post-op Assessment: Report given to PACU RN, Post -op Vital signs reviewed and stable and Patient moving all extremities  Post vital signs: Reviewed and stable  Complications: No apparent anesthesia complications

## 2013-10-27 NOTE — Anesthesia Preprocedure Evaluation (Addendum)
Anesthesia Evaluation  Patient identified by MRN, date of birth, ID band Patient awake    Reviewed: Allergy & Precautions, H&P , NPO status , Patient's Chart, lab work & pertinent test results, reviewed documented beta blocker date and time   Airway Mallampati: II TM Distance: >3 FB Neck ROM: Full    Dental  (+) Dental Advisory Given, Poor Dentition and Edentulous Upper Very poor dentition, denies loose teeth. :   Pulmonary Current Smoker,          Cardiovascular hypertension,     Neuro/Psych    GI/Hepatic   Endo/Other  diabetes  Renal/GU      Musculoskeletal   Abdominal   Peds  Hematology   Anesthesia Other Findings   Reproductive/Obstetrics                          Anesthesia Physical Anesthesia Plan  ASA: III  Anesthesia Plan: General   Post-op Pain Management:    Induction:   Airway Management Planned: Oral ETT  Additional Equipment:   Intra-op Plan:   Post-operative Plan: Extubation in OR  Informed Consent: I have reviewed the patients History and Physical, chart, labs and discussed the procedure including the risks, benefits and alternatives for the proposed anesthesia with the patient or authorized representative who has indicated his/her understanding and acceptance.   Dental advisory given  Plan Discussed with: CRNA and Anesthesiologist  Anesthesia Plan Comments:        Anesthesia Quick Evaluation

## 2013-10-27 NOTE — Anesthesia Procedure Notes (Signed)
Procedure Name: Intubation Date/Time: 10/27/2013 7:45 AM Performed by: Julian Reil Pre-anesthesia Checklist: Patient identified, Emergency Drugs available, Suction available and Patient being monitored Patient Re-evaluated:Patient Re-evaluated prior to inductionOxygen Delivery Method: Circle system utilized Preoxygenation: Pre-oxygenation with 100% oxygen Intubation Type: IV induction Ventilation: Mask ventilation without difficulty and Oral airway inserted - appropriate to patient size Laryngoscope Size: Mac and 4 Grade View: Grade I Tube type: Oral Tube size: 7.5 mm Number of attempts: 1 Airway Equipment and Method: Stylet Placement Confirmation: ETT inserted through vocal cords under direct vision,  positive ETCO2 and breath sounds checked- equal and bilateral Secured at: 22 cm Tube secured with: Tape Comments: Smooth IV induction. Placed oral airway carefully and masked patient without difficulty. Upon removing the oral airway, prior to DL, blood noted on tongue. On further examination it appears that blood was coming from a broken upper tooth. Patient has very poor dentition. Examined oropharynx and airway very carefully prior to DL and thoroughly suctioned oropharynx, no blood or tooth fragments noted. DL x 1, grade I view, cords clear, atraumatic intubation. Plan to obtain portable chest Xray at end of case to rule out tooth fragment in lungs.

## 2013-10-27 NOTE — Progress Notes (Signed)
Patient received to room 3s bed 11 from PACU via bed. Pateint is alert and oriented x's 4 and wide awake, drinking coffee. Vitals obtained and BP is slightly elevated will continue to monitor. Wife and daughter's at bedside. Patient has been instructed to call for assistance.

## 2013-10-28 DIAGNOSIS — F17200 Nicotine dependence, unspecified, uncomplicated: Secondary | ICD-10-CM

## 2013-10-28 DIAGNOSIS — I739 Peripheral vascular disease, unspecified: Secondary | ICD-10-CM

## 2013-10-28 DIAGNOSIS — N179 Acute kidney failure, unspecified: Secondary | ICD-10-CM

## 2013-10-28 LAB — BASIC METABOLIC PANEL
BUN: 14 mg/dL (ref 6–23)
CO2: 20 mEq/L (ref 19–32)
Chloride: 99 mEq/L (ref 96–112)
Creatinine, Ser: 2.43 mg/dL — ABNORMAL HIGH (ref 0.50–1.35)
GFR calc Af Amer: 33 mL/min — ABNORMAL LOW (ref >90)
Glucose, Bld: 114 mg/dL — ABNORMAL HIGH (ref 70–99)
Potassium: 5.1 mEq/L (ref 3.5–5.1)

## 2013-10-28 LAB — CBC
HCT: 29.7 % — ABNORMAL LOW (ref 39.0–52.0)
Hemoglobin: 10.2 g/dL — ABNORMAL LOW (ref 13.0–17.0)
MCV: 90.8 fL (ref 78.0–100.0)
Platelets: 396 10*3/uL (ref 150–400)
RDW: 12.2 % (ref 11.5–15.5)
WBC: 14.4 10*3/uL — ABNORMAL HIGH (ref 4.0–10.5)

## 2013-10-28 LAB — GLUCOSE, CAPILLARY
Glucose-Capillary: 108 mg/dL — ABNORMAL HIGH (ref 70–99)
Glucose-Capillary: 155 mg/dL — ABNORMAL HIGH (ref 70–99)

## 2013-10-28 LAB — HEMOGLOBIN A1C: Mean Plasma Glucose: 146 mg/dL — ABNORMAL HIGH (ref <117)

## 2013-10-28 MED ORDER — VANCOMYCIN HCL 10 G IV SOLR
1500.0000 mg | INTRAVENOUS | Status: DC
  Administered 2013-10-29: 1500 mg via INTRAVENOUS
  Filled 2013-10-28: qty 1500

## 2013-10-28 MED ORDER — SODIUM CHLORIDE 0.9 % IV SOLN
INTRAVENOUS | Status: DC
  Administered 2013-10-28 (×2): via INTRAVENOUS

## 2013-10-28 NOTE — Progress Notes (Signed)
Physician notified: [name], [place] At: 1253  Regarding: Pt. Left foot dressing saturated. No orders to change dressing.  Awaiting return response.   Returned Response at: 1256  Order(s): OK to change dressing, guaze and ACE wrap, FYI surgical site is open.

## 2013-10-28 NOTE — Progress Notes (Signed)
Notified PA of low urine output throughout the night. New orders received to keep foley in place.

## 2013-10-28 NOTE — Progress Notes (Signed)
[facility] PROGRESS NOTE  Alexander [name] [id number] DOB: 1961/05/10 DOA: 10/21/2013 PCP: No primary provider on file.  Assessment/Plan: Osteomyelitis of left foot  - Orthopedic surgeon and vascular surgeon on board following - Continue broad-spectrum IV antibiotics at this point - sp L common femoral to tibial peroneal trunk bypass, intraoperative arteriogram, and amputation of L4th toe and debridement of lateral foot   DIABETES MELLITUS, TYPE II  - Will continue amaryl and januvia - Blood sugars relatively well controlled currently  HYPERTENSION  - continue on metoprolol - Hold ACEI secondary to ARF (see below) - Continue hydralazine 10 mg every 6 hours IV PRN for better BP control  - Will continue current regimen - May consider adding Ca channel blocker if bp still difficult to control  Hyponatremia  - Most likely due to poor oral solid intake - Continuing to improve with IV fluids  Leukocytosis  - secondary to left foot osteomyelitis  - continue to monitor WBC count - Continue IV antibiotics vancomycin and Zosyn  Anemia  - normocytic - Has remained stable thus far - Cont to monitor  ARF - Cr up to over 2.4 overnight with decreased urine output - suspect contrast nephropathy from yesterday's procedure - Will increase IVF to 100cc/hr - Monitor renal fx - Hold ACEI and other nephrotoxic drugs until renal function improves  Code Status: full Family Communication: Discussed at length with family (wife and daughters)  Disposition Plan: IV antibiotics with transfer to [place] likely Monday for Vascular procedure most likely on Tuesday.  Consultants:  Orthopaedic surgery  Vascular surgery  Procedures:  ABI  MRI of left foot  Arteriogram 10/27/13  L common femoral to tibial peroneal trunk bypass 10/27/13  Amputation of L4th toe and debridement of lateral foot 10/27/13  Antibiotics:  Vancomycin and zosyn  HPI/Subjective: No new complaints.  S/p surgery yesterday  Objective: Filed Vitals:   10/28/13 0746  BP:   Pulse:   Temp: 98.2 F (36.8 C)  Resp:     Intake/Output Summary (Last 24 hours) at 10/28/13 0804 Last data filed at 10/28/13 0754  Gross per 24 hour  Intake 4942.5 ml  Output    745 ml  Net 4197.5 ml   Filed Weights   10/25/13 0413 10/25/13 0658 10/26/13 0533  Weight: 113.172 kg (249 lb 8 oz) 111.993 kg (246 lb 14.4 oz) 113.898 kg (251 lb 1.6 oz)    Exam:   General:  Pt in NAD, Alert and Awake  Cardiovascular: RRR, no MRG  Respiratory: CTA BL, no wheezes  Abdomen: soft, NT, ND  Musculoskeletal: no cyanosis or clubbing. Patient has ulcer at left foot  Data Reviewed: Basic Metabolic Panel:  Recent Labs Lab 10/21/13 1545 10/21/13 2007 10/22/13 0525 10/24/13 1334 10/25/13 0623 10/26/13 0520 10/27/13 0430 10/28/13 0015  NA 116* 119* 122* 124* 127* 128*  --  130*  K 5.0 4.5 4.4 4.3 4.3 4.5  --  5.1  CL 86* 88* 89* 92* 98 96  --  99  CO2 19 22 20 19 21 20   --  20  GLUCOSE 141* 123* 78 153* 188* 95  --  114*  BUN 14 15 17 20 16 13   --  14  CREATININE 0.89 0.93 1.19 1.53* 1.47* 1.48* 1.43* 2.43*  CALCIUM 8.6 8.5 8.4 8.1* 7.9* 8.2*  --  7.9*  MG  --  2.1  --   --   --   --   --   --   PHOS  --  4.5  --   --   --   --   --   --    Liver Function Tests:  Recent Labs Lab 10/21/13 2007  AST 20  ALT 26  ALKPHOS 80  BILITOT 0.5  PROT 6.0  ALBUMIN 2.4*   No results found for this basename: LIPASE, AMYLASE,  in the last 168 hours No results found for this basename: AMMONIA,  in the last 168 hours CBC:  Recent Labs Lab 10/21/13 2007 10/22/13 0525 10/25/13 0623 10/26/13 0520 10/27/13 0430 10/28/13 0015  WBC 12.9* 10.8* 10.5 11.1* 10.5 14.4*  NEUTROABS 10.3*  --   --   --   --   --   HGB 10.2* 10.3* 9.0* 9.5* 9.8* 10.2*  HCT 27.6* 27.9* 24.9* 26.9* 27.6* 29.7*  MCV 85.2 85.8 85.9 86.5 88.7 90.8  PLT 275 281 302 332 366 396   Cardiac Enzymes: No results found for this  basename: CKTOTAL, CKMB, CKMBINDEX, TROPONINI,  in the last 168 hours BNP (last 3 results) No results found for this basename: PROBNP,  in the last 8760 hours CBG:  Recent Labs Lab 10/26/13 2302 10/27/13 0540 10/27/13 1411 10/27/13 1643 10/27/13 2134  GLUCAP 117* 116* 139* 163* 192*    Recent Results (from the past 240 hour(s))  URINE CULTURE     Status: None   Collection Time    10/22/13  3:10 PM      Result Value Range Status   Specimen Description URINE, RANDOM   Final   Special Requests NONE   Final   Culture  Setup Time     Final   Value: 10/22/2013 21:54     Performed at [facility]     Final   Value: NO GROWTH     Performed at [facility]   Culture     Final   Value: NO GROWTH     Performed at [facility]   Report Status 10/23/2013 FINAL   Final  MRSA PCR SCREENING     Status: None   Collection Time    10/26/13  9:51 PM      Result Value Range Status   MRSA by PCR NEGATIVE  NEGATIVE Final   Comment:            The GeneXpert MRSA Assay (FDA     approved for NASAL specimens     only), is one component of a     comprehensive MRSA colonization     surveillance program. It is not     intended to diagnose MRSA     infection nor to guide or     monitor treatment for     MRSA infections.     Studies: Dg Chest Portable 1 View  10/27/2013   CLINICAL DATA:  Rule out foreign object from surgery. Possible chipped tooth during induction.  EXAM: PORTABLE CHEST - 1 VIEW  COMPARISON:  08/17/2013  FINDINGS: Endotracheal tube tip projects approximately 3 cm above the carina. Multiple monitoring leads overlie the upper and mid chest. Lung volumes are mildly diminished with mild central pulmonary vascular congestion. There is oblong opacity in the retrocardiac left lung base. The cardiac silhouette is enlarged. No radiopaque foreign body is identified.  IMPRESSION: 1. No radiopaque foreign body identified in the chest. 2. Endotracheal  tube in satisfactory position. 3. Mildly diminished lung volumes with central pulmonary vascular congestion and left basilar opacity, likely atelectasis.   Electronically Signed  By: Logan Bores   On: 10/27/2013 14:00   Dg Ang/ext/uni/or Left  10/27/2013   CLINICAL DATA:  Left lower extremity femoral to tibioperoneal trunk bypass for perform serial occlusive disease with nonhealing wound of left foot with osteomyelitis.  EXAM: LEFT ANG/EXT/UNI/ OR  TECHNIQUE: Intraoperative image was obtained.  CONTRAST:  See operative report.  COMPARISON:  Arteriography on 10/23/2013.  FLUOROSCOPY TIME:  See operative report.  FINDINGS: Lower leg image obtained intraoperatively shows the distal and of a saphenous vein graft with anastomosis to the tibioperoneal trunk below the knee. Injected contrast opacified is the native anterior tibial artery and posterior tibial artery with flow identified to the level of the ankle. The peroneal artery is open but demonstrates significant segments of stenosis and disease.  IMPRESSION: Open distal bypass graft with anastomosis to the tibioperoneal trunk below the knee. Anterior tibial and posterior tibial runoff is visualized. The peroneal artery is diffusely diseased.   Electronically Signed   By: Aletta Edouard M.D.   On: 10/27/2013 14:05    Scheduled Meds: . aspirin EC  81 mg Oral Daily  . docusate sodium  100 mg Oral Daily  . enoxaparin (LOVENOX) injection  40 mg Subcutaneous Q24H  . glimepiride  4 mg Oral QAC breakfast  . insulin aspart  0-15 Units Subcutaneous TID WC  . linagliptin  5 mg Oral Q supper   And  . metFORMIN  2,000 mg Oral Q supper  . lisinopril  20 mg Oral BID  . metoprolol succinate  100 mg Oral Daily  . multivitamin with minerals  1 tablet Oral Daily  . pantoprazole  40 mg Oral Daily  . piperacillin-tazobactam (ZOSYN)  IV  3.375 g Intravenous Q8H  . sodium chloride  3 mL Intravenous Q12H  . vancomycin  1,000 mg Intravenous Q12H   Continuous  Infusions: . sodium chloride 75 mL/hr at 10/22/13 1040  . sodium chloride 75 mL/hr at 10/26/13 1233  . sodium chloride 75 mL/hr at 10/28/13 0600  . DOPamine      Principal Problem:   Osteomyelitis of left foot Active Problems:   DIABETES MELLITUS, TYPE II   HYPERTENSION   Leukocytosis   Anemia   PAD (peripheral artery disease)  Time spent: > 35 minutes  Kallen Mccrystal K  [facility] Pager [phone] If 7PM-7AM, please contact night-coverage at [email or web address], password [facility] 10/28/2013, 8:04 AM  LOS: 7 days

## 2013-10-28 NOTE — Progress Notes (Signed)
ANTIBIOTIC CONSULT NOTE - FOLLOW UP  Pharmacy Consult for vancomycin + zosyn Indication: osteo  Allergies  Allergen Reactions  . Sulfa Antibiotics Rash    Patient Measurements: Height: 6' (182.9 cm) Weight: 251 lb 1.6 oz (113.898 kg) IBW/kg (Calculated) : 77.6 Adjusted Body Weight:   Vital Signs: Temp: 98.2 F (36.8 C) (12/17 0746) Temp src: Oral (12/17 0746) BP: 129/60 mmHg (12/17 0748) Pulse Rate: 67 (12/17 0748) Intake/Output from previous day: 12/16 0701 - 12/17 0700 In: 5017.5 [P.O.:960; I.V.:3807.5; IV Piggyback:250] Out: 730 [Urine:655; Blood:75] Intake/Output from this shift: Total I/O In: 175 [P.O.:100; I.V.:75] Out: 15 [Urine:15]  Labs:  Recent Labs  10/26/13 0520 10/27/13 0430 10/28/13 0015  WBC 11.1* 10.5 14.4*  HGB 9.5* 9.8* 10.2*  PLT 332 366 396  CREATININE 1.48* 1.43* 2.43*   Estimated Creatinine Clearance: 45.3 ml/min (by C-G formula based on Cr of 2.43).  Recent Labs  10/26/13 0520  [employer] 19.4     Microbiology: Recent Results (from the past 720 hour(s))  URINE CULTURE     Status: None   Collection Time    10/22/13  3:10 PM      Result Value Range Status   Specimen Description URINE, RANDOM   Final   Special Requests NONE   Final   Culture  Setup Time     Final   Value: 10/22/2013 21:54     Performed at [place]     Final   Value: NO GROWTH     Performed at [facility]   Culture     Final   Value: NO GROWTH     Performed at [facility]   Report Status 10/23/2013 FINAL   Final  MRSA PCR SCREENING     Status: None   Collection Time    10/26/13  9:51 PM      Result Value Range Status   MRSA by PCR NEGATIVE  NEGATIVE Final   Comment:            The GeneXpert MRSA Assay (FDA     approved for NASAL specimens     only), is one component of a     comprehensive MRSA colonization     surveillance program. It is not     intended to diagnose MRSA     infection nor to guide or   monitor treatment for     MRSA infections.    Anti-infectives   Start     Dose/Rate Route Frequency Ordered Stop   10/29/13 0600  vancomycin (VANCOCIN) 1,500 mg in sodium chloride 0.9 % 500 mL IVPB     1,500 mg 250 mL/hr over 120 Minutes Intravenous Every 24 hours 10/28/13 1119     10/27/13 1645  cefUROXime (ZINACEF) 1.5 g in dextrose 5 % 50 mL IVPB  Status:  Discontinued     1.5 g 100 mL/hr over 30 Minutes Intravenous Every 12 hours 10/27/13 1639 10/27/13 1642   10/22/13 0500  vancomycin (VANCOCIN) IVPB 1000 mg/200 mL premix  Status:  Discontinued     1,000 mg 200 mL/hr over 60 Minutes Intravenous Every 12 hours 10/21/13 1606 10/28/13 1119   10/22/13 0100  piperacillin-tazobactam (ZOSYN) IVPB 3.375 g     3.375 g 12.5 mL/hr over 240 Minutes Intravenous Every 8 hours 10/21/13 1606     10/21/13 1615  vancomycin (VANCOCIN) 2,000 mg in sodium chloride 0.9 % 500 mL IVPB     2,000 mg 250 mL/hr over  120 Minutes Intravenous STAT 10/21/13 1606 10/21/13 2000   10/21/13 1615  piperacillin-tazobactam (ZOSYN) IVPB 3.375 g     3.375 g 100 mL/hr over 30 Minutes Intravenous STAT 10/21/13 1606 10/21/13 1728      Assessment: 19 yom continues on broad-spectrum vancomycin + zosyn for osteomyelitis. S/p amputation on 12/16. Pt is afebrile and WBC is elevated at 14.4. Of note, Scr jumped up from 1.43 to 2.43. UOP is also low.   Vanc 12/10>> Zosyn 12/10>>  12/15 MRSA - NEG 12/11 Urine - NEG  Goal of Therapy:  Vancomycin trough level 15-20 mcg/ml  Plan:  1. Continue zosyn 3.375gm IV Q8H (4 hr inf) 2. Change vancomycin to 1500mg  IV Q24H 3. F/u renal fxn, C&S, clinical status and trough at SS 4. MD - please addressed planned LOT  Shamieka Gullo, Rande Lawman 10/28/2013,11:20 AM

## 2013-10-28 NOTE — Progress Notes (Signed)
Physician notified: Wyline Copas At: 1542  Regarding: UOP 13ml from day shift. Has foley. Bladder scan 69ml. IVF NS @ 134ml/hr. Scrotum and penis 3+ edema. BLE 1+ pitting. Cr 2.43 this AM. DC metformin and lisinopril this AM.  Awaiting return response.   Returned Response at: 1543  Order(s): Continue to monitor. Continue IVF at 136ml/hr.

## 2013-10-28 NOTE — Evaluation (Signed)
Occupational Therapy Evaluation Patient Details Name: Alexander [name] MRN: [id number] DOB: 01/30/1961 Today's Date: 10/28/2013 Time: [id number] OT Time Calculation (min): 26 min  OT Assessment / Plan / Recommendation History of present illness s/p fem-pop bypass, 2* PVD, L 4th toe amputation and 5th metatarsal mid-shaft amputation.   Clinical Impression   Evaluation limited to sitting activities, brief standing with walker, and basic transfer with walker as WB status has not been clarified.  Pt presents with L LE pain and impaired balance following the above surgery.  Will follow to address bathroom equipment needs, toilet and shower transfers.  Pt will rely on his family to assist with LB bathing and dressing and is not interested in adaptive equipment.    OT Assessment  Patient needs continued OT Services    Follow Up Recommendations  Home health OT- will update recommendation next visit    Barriers to Discharge      Equipment Recommendations   (may required 3 in 1, will determine next visit)    Recommendations for Other Services    Frequency  Min 2X/week    Precautions / Restrictions Precautions Precautions: Fall Restrictions Weight Bearing Restrictions: No (charge nurse calling MD to clarify and for [place])   Pertinent Vitals/Pain See flow sheet, RN notified, repositioned L LE    ADL  Eating/Feeding: Independent Where Assessed - Eating/Feeding: Chair Grooming: Wash/dry hands;Wash/dry face;Brushing hair;Set up Where Assessed - Grooming: Unsupported sitting Upper Body Bathing: Set up Where Assessed - Upper Body Bathing: Supported sitting Lower Body Bathing: Moderate assistance Where Assessed - Lower Body Bathing: Unsupported sitting;Supported sit to stand Upper Body Dressing: Set up Where Assessed - Upper Body Dressing: Unsupported sitting Lower Body Dressing: Moderate assistance Where Assessed - Lower Body Dressing: Unsupported sitting;Supported sit to stand Toilet  Transfer: Simulated;Minimal assistance Toilet Transfer Method: Stand pivot Toilet Transfer Equipment:  (bed to chair) Equipment Used: Gait belt;Rolling walker Transfers/Ambulation Related to ADLs: did not ambulate with pt, transferred only bed<>chair since WB status not clarified ADL Comments: Pt not able to access his L foot for bathing and dressing, attempts to bend pulls at incision.  Able to donn and doff R sock crossing foot over opposite knee.    OT Diagnosis: Generalized weakness;Acute pain  OT Problem List: Decreased strength;Decreased activity tolerance;Impaired balance (sitting and/or standing);Decreased knowledge of use of DME or AE;Obesity;Pain OT Treatment Interventions: Self-care/ADL training;DME and/or AE instruction;Patient/family education;Balance training   OT Goals(Current goals can be found in the care plan section) Acute Rehab OT Goals Patient Stated Goal: Home with family. OT Goal Formulation: With patient Time For Goal Achievement: 10/28/13 Potential to Achieve Goals: Good ADL Goals Pt Will Perform Grooming: with supervision;standing Pt Will Transfer to Toilet: with supervision;ambulating (determine need for 3 in 1) Pt Will Perform Toileting - Clothing Manipulation and hygiene: with modified independence;sitting/lateral leans Pt Will Perform Tub/Shower Transfer: with supervision;ambulating;rolling walker (built in shower seat with bar at home.)  Visit Information  Last OT Received On: 10/28/13 Assistance Needed: +1 History of Present Illness: s/p fem-pop bypass, 2* PVD, L 4th toe amputation and 5th metatarsal mid-shaft amputation.       Prior [place] expects to be discharged to:: Private residence Living Arrangements: Spouse/significant other;Children Available Help at Discharge: Family;Available 24 hours/day Type of Home: House Home Access: Stairs to enter [employer] of Steps: 2 Entrance Stairs-Rails:  None Home Layout: One level Home Equipment: Walker - 2 wheels;Shower seat - built in;Grab bars - tub/shower;Crutches (  sink beside toilet) Prior Function Level of Independence: Independent Communication Communication: No difficulties Dominant Hand: Right         Vision/Perception Vision - History Patient Visual Report: No change from baseline   Cognition  Cognition Arousal/Alertness: Awake/alert Behavior During Therapy: WFL for tasks assessed/performed Overall Cognitive Status: Within Functional Limits for tasks assessed    Extremity/Trunk Assessment Upper Extremity Assessment Upper Extremity Assessment: Overall WFL for tasks assessed Lower Extremity Assessment Lower Extremity Assessment: Defer to PT evaluation Cervical / Trunk Assessment Cervical / Trunk Assessment: Normal     Mobility Transfers Transfers: Sit to Stand;Stand to Sit Sit to Stand: 4: Min guard;With upper extremity assist;From chair/3-in-1;From bed Stand to Sit: 4: Min guard;With upper extremity assist;To bed;To chair/3-in-1 Details for Transfer Assistance: verbal cues for hand placement     Exercise     Balance Balance Balance Assessed: Yes Dynamic Sitting Balance Dynamic Sitting - Balance Support: Feet supported Dynamic Sitting - Level of Assistance: 7: Independent Dynamic Sitting - Balance Activities: Forward lean/weight shifting (for socks) Static Standing Balance Static Standing - Balance Support: Right upper extremity supported;Left upper extremity supported Static Standing - Level of Assistance: 5: Stand by assistance Static Standing - Comment/# of Minutes: 1   End of Session OT - End of Session Activity Tolerance: Patient limited by pain Patient left: in chair;with call bell/phone within reach Nurse Communication: Patient requests pain meds  GO     Malka So 10/28/2013, 12:06 PM [phone]

## 2013-10-28 NOTE — Progress Notes (Signed)
Spoke with patient pertaining to diabetic knowledge. Pt. Has been diabetic for years, per patient statement. Understands medication regimen, compliant. States BG checked every other day, has been 150-160. Talked about PCP; pt. Stated he doesn't have insurance at this time, has information on it, doesn't want more information. Explained to patient [facility]. Importance of checking feet for blisters and monitoring blood glucose. Pt. Accepted education, teach back given from patient to RN. Will continue to monitor patient.

## 2013-10-28 NOTE — Progress Notes (Signed)
Physician notified: Scot Dock At: 1225  Regarding: Weight bearing status, ok for darco boot? Awaiting return response.   Returned Response at: 1226  Order(s): Partial weight bearing status on LLE, heel only. Darco shoe OK.

## 2013-10-28 NOTE — ED Provider Notes (Signed)
Medical screening examination/treatment/procedure(s) were conducted as a shared visit with non-physician practitioner(s) and myself.  I personally evaluated the patient during the encounter.  EKG Interpretation    Date/Time:    Ventricular Rate:    PR Interval:    QRS Duration:   QT Interval:    QTC Calculation:   R Axis:     Text Interpretation:             See other note.   Virgel Manifold, MD 10/28/13 [phone]

## 2013-10-28 NOTE — Evaluation (Signed)
Physical Therapy Evaluation Patient Details Name: Alexander [name] MRN: [id number] DOB: July 10, 1961 Today's Date: 10/28/2013 Time: [id number] PT Time Calculation (min): 26 min  PT Assessment / Plan / Recommendation History of Present Illness  s/p fem-pop bypass, 2* PVD, L 4th toe amputation and 5th metatarsal mid-shaft amputation.  Clinical Impression  Patient presents with decreased independence with mobility due to deficits listed below.  Will need acute level skilled PT to progress to safe mobility for d/c home with family assist.  Unable to effectively use heel wedge boot due to limited ankle AROM.  Likely needs to learn NWB for best healing potiential.    PT Assessment  Patient needs continued PT services    Follow Up Recommendations  Home health PT;Supervision/Assistance - 24 hour    Does the patient have the potential to tolerate intense rehabilitation    N/A  Barriers to Discharge  None      Equipment Recommendations  None recommended by PT    Recommendations for Other Services   None  Frequency Min 3X/week    Precautions / Restrictions Precautions Precautions: Fall Restrictions Weight Bearing Restrictions: Yes Other Position/Activity Restrictions: heel weight bearing on [facility] shoe   Pertinent Vitals/Pain 6/10 in left foot      Mobility  Bed Mobility Bed Mobility: Sit to Supine Sit to Supine: 5: Supervision;HOB flat Details for Bed Mobility Assistance: assist for lines and foley Transfers Transfers: Stand Pivot Transfers Sit to Stand: 4: Min guard;With upper extremity assist;From chair/3-in-1 Stand to Sit: 4: Min guard;With upper extremity assist;To bed Stand Pivot Transfers: 4: Min guard Details for Transfer Assistance: verbal cues for hand placement; pivot with walker to bed, cues for heel weight bearing in darco shoe, but unable due to limited ankle DF. Ambulation/Gait Ambulation/Gait Assistance: Not tested (comment)    Exercises General Exercises - Lower  Extremity Ankle Circles/Pumps: AROM;Left;10 reps;Seated   PT Diagnosis: Difficulty walking  PT Problem List: Decreased strength;Decreased range of motion;Decreased balance;Decreased mobility;Decreased knowledge of precautions;Decreased safety awareness;Decreased knowledge of use of DME PT Treatment Interventions: DME instruction;Balance training;Gait training;Stair training;Functional mobility training;Patient/family education;Therapeutic activities;Therapeutic exercise     PT Goals(Current goals can be found in the care plan section) Acute Rehab PT Goals Patient Stated Goal: Home with family. PT Goal Formulation: With patient Time For Goal Achievement: 11/04/13 Potential to Achieve Goals: Good  Visit Information  Last PT Received On: 10/28/13 Assistance Needed: +1 History of Present Illness: s/p fem-pop bypass, 2* PVD, L 4th toe amputation and 5th metatarsal mid-shaft amputation.       Prior [place] expects to be discharged to:: Private residence Living Arrangements: Spouse/significant other;Children Available Help at Discharge: Family;Available 24 hours/day Type of Home: House Home Access: Stairs to enter [employer] of Steps: 2 Entrance Stairs-Rails: None Home Layout: One level Home Equipment: Walker - 2 wheels;Shower seat - built in;Grab bars - tub/shower;Crutches Prior Function Level of Independence: Independent Communication Communication: No difficulties Dominant Hand: Right    Cognition  Cognition Arousal/Alertness: Awake/alert Behavior During Therapy: WFL for tasks assessed/performed Overall Cognitive Status: Within Functional Limits for tasks assessed    Extremity/Trunk Assessment Lower Extremity Assessment Lower Extremity Assessment: LLE deficits/detail LLE Deficits / Details: decreased ankle DF about -20 degrees with edema and large kerlix and ace bandage LLE Sensation: decreased light touch (on foot) Cervical /  Trunk Assessment Cervical / Trunk Assessment: Normal   Balance Static Standing Balance Static Standing - Balance Support: Right upper extremity supported;Left upper extremity supported Static Standing -  Level of Assistance: 4: Min assist  End of Session PT - End of Session Equipment Utilized During Treatment: Gait belt Activity Tolerance: Patient tolerated treatment well Patient left: in bed;with call bell/phone within reach  GP     [facility] 10/28/2013, 4:19 PM [place], [place] 10/28/2013

## 2013-10-28 NOTE — Progress Notes (Signed)
PHARMACIST - PHYSICIAN COMMUNICATION DR:  Wyline Copas CONCERNING:  METFORMIN SAFE ADMINISTRATION POLICY  RECOMMENDATION: Metformin has been placed on DISCONTINUE (rejected order) STATUS and should be reordered only after any of the conditions below are ruled out.  Current safety recommendations include avoiding metformin for a minimum of 48 hours after the patient's exposure to intravenous contrast media.  DESCRIPTION:  The Pharmacy Committee has adopted a policy that restricts the use of metformin in hospitalized patients until all the contraindications to administration have been ruled out. Specific contraindications are: [x]  Serum creatinine ? 1.5 for males []  Serum creatinine ? 1.4 for females []  Shock, acute MI, sepsis, hypoxemia, dehydration []  Planned administration of intravenous iodinated contrast media []  Heart Failure patients with low EF []  Acute or chronic metabolic acidosis (including DKA)      Salome Arnt, PharmD, BCPS Pager # [phone] 10/28/2013 8:09 AM

## 2013-10-28 NOTE — Progress Notes (Addendum)
[facility] [place]  Subjective  - Doing well over all.  Sitting up in the bed side chair.  Objective 127/65 70 98.5 F (36.9 C) (Oral) 14 98%  Intake/Output Summary (Last 24 hours) at 10/28/13 0737 Last data filed at 10/28/13 0500  Gross per 24 hour  Intake 4867.5 ml  Output    730 ml  Net 4137.5 ml   Doppler anterior tibialis and PT Left foot dressing in place Incisions clean and dry.  Placed dry guaze in left groin. Moderate edema left leg.  Assessment/Planning: POD #1 Left fem-pop by pass, left 4th digit amputation and -5th metatarsal revision amputation mid shaft. -3 erd Metatarsal head/ tissue does not appear viable.  Was felt that he would likely require further work on the foot once the wound had declared itself.  -Cr 1.48 10/27/2013, now 1.43.  Urine output 650 total since surgery. Will discuss Transfer status with Dr. Scot Dock. Dr. Berenice Primas will follow the left foot amputation site.  Laurence Slate [facility] 10/28/2013 7:37 AM  Agree with above. Palpable graft pulse and brisk DP and PT signal with the doppler. Begin dressing changes. Will need further work on the foot once wounds cleaner.  [name], MD, [place] [phone] 10/28/2013

## 2013-10-28 NOTE — Progress Notes (Signed)
Orthopedic Tech Progress Note Patient Details:  Alexander [name] [birth year] [id number]  Patient ID: Alexander [name], male   DOB: Aug 22, 1961, 52 y.o.   MRN: [id number]   [name] 10/28/2013, 2:15 PMPt has darco shoe at home.

## 2013-10-28 NOTE — Anesthesia Postprocedure Evaluation (Signed)
  Anesthesia Post-op Note  Patient: Alexander [name]  Procedure(s) Performed: Procedure(s): BYPASS GRAFT LEFT FEMORAL- POSTERIOR TIBIAL ARTERY (Left) IRRIGATION AND DEBRIDEMENT EXTREMITY- LEFT FOOT (Left) AMPUTATION DIGIT-LEFT 4TH TOE, 4th and 5th metatarsal. (Left)  Patient Location: PACU  Anesthesia Type:General  Level of Consciousness: awake  Airway and Oxygen Therapy: Patient Spontanous Breathing  Post-op Pain: mild  Post-op Assessment: Post-op Vital signs reviewed  Post-op Vital Signs: Reviewed  Complications: No apparent anesthesia complications

## 2013-10-29 ENCOUNTER — Encounter (HOSPITAL_COMMUNITY): Payer: Self-pay | Admitting: Vascular Surgery

## 2013-10-29 DIAGNOSIS — M48062 Spinal stenosis, lumbar region with neurogenic claudication: Secondary | ICD-10-CM

## 2013-10-29 DIAGNOSIS — N179 Acute kidney failure, unspecified: Secondary | ICD-10-CM

## 2013-10-29 LAB — URINE MICROSCOPIC-ADD ON

## 2013-10-29 LAB — URINALYSIS, ROUTINE W REFLEX MICROSCOPIC
Glucose, UA: NEGATIVE mg/dL
Ketones, ur: NEGATIVE mg/dL
Leukocytes, UA: NEGATIVE
Protein, ur: NEGATIVE mg/dL
pH: 5 (ref 5.0–8.0)

## 2013-10-29 LAB — BASIC METABOLIC PANEL
BUN: 21 mg/dL (ref 6–23)
CO2: 18 mEq/L — ABNORMAL LOW (ref 19–32)
Calcium: 7.5 mg/dL — ABNORMAL LOW (ref 8.4–10.5)
Chloride: 98 mEq/L (ref 96–112)
GFR calc Af Amer: 18 mL/min — ABNORMAL LOW (ref >90)
Glucose, Bld: 155 mg/dL — ABNORMAL HIGH (ref 70–99)
Potassium: 4.6 mEq/L (ref 3.5–5.1)

## 2013-10-29 LAB — PROTEIN / CREATININE RATIO, URINE
Protein Creatinine Ratio: 0.25 — ABNORMAL HIGH (ref 0.00–0.15)
Total Protein, Urine: 21.3 mg/dL

## 2013-10-29 LAB — GLUCOSE, CAPILLARY
Glucose-Capillary: 100 mg/dL — ABNORMAL HIGH (ref 70–99)
Glucose-Capillary: 89 mg/dL (ref 70–99)

## 2013-10-29 MED ORDER — HEPARIN SODIUM (PORCINE) 5000 UNIT/ML IJ SOLN
5000.0000 [IU] | Freq: Three times a day (TID) | INTRAMUSCULAR | Status: DC
  Administered 2013-10-29 – 2013-11-03 (×16): 5000 [IU] via SUBCUTANEOUS
  Filled 2013-10-29 (×22): qty 1

## 2013-10-29 MED ORDER — SODIUM CHLORIDE 0.9 % IV SOLN: INTRAVENOUS | Status: DC

## 2013-10-29 MED ORDER — FUROSEMIDE 10 MG/ML IJ SOLN
80.0000 mg | Freq: Three times a day (TID) | INTRAMUSCULAR | Status: DC
  Administered 2013-10-29 – 2013-10-30 (×4): 80 mg via INTRAVENOUS
  Filled 2013-10-29 (×6): qty 8

## 2013-10-29 MED ORDER — ENOXAPARIN SODIUM 30 MG/0.3ML ~~LOC~~ SOLN
30.0000 mg | SUBCUTANEOUS | Status: DC
  Filled 2013-10-29: qty 0.3

## 2013-10-29 NOTE — Progress Notes (Addendum)
[facility] PROGRESS NOTE  Alexander [name] [id number] DOB: 1961-03-03 DOA: 10/21/2013 PCP: No primary provider on file.  Off service summary: Patient is a 52 year old Caucasian male with history of diabetes on oral hypoglycemic agents, hypertension, and history of left foot ulcer. Patient approximately one month ago status post amputation of fifth ray but since operation his wound has not healed well and patient presented with dusky fourth toe and open and draining foul-smelling wound. Was found to have osteomyelitis. Orthopedic surgeon and vascular surgeon on board and plans are for further treatment from a surgical standpoint at [facility].  Upon transfer to [facility], the patient was seen by Orthopedic surgery and ultimately underwent partial L foot amputation on 10/27/13. Included was an intra-operative arteriogram. Post-operatively, the patient was noted to have acute renal failure, likely secondary to contrast nephropathy. He has since been continued on aggressive IVF.    Assessment/Plan: Osteomyelitis of left foot  - Orthopedic surgeon and vascular surgeon on board following - Continue broad-spectrum IV antibiotics at this point - sp L common femoral to tibial peroneal trunk bypass, intraoperative arteriogram, and amputation of L4th toe and debridement of lateral foot   DIABETES MELLITUS, TYPE II  - Will continue amaryl and januvia - Blood sugars relatively well controlled currently  HYPERTENSION  - continue on metoprolol - Hold ACEI secondary to ARF (see below) - Continue hydralazine 10 mg every 6 hours IV PRN for better BP control  - Will continue current regimen - May consider adding Ca channel blocker if bp still difficult to control  Hyponatremia  - Most likely due to poor oral solid intake - Continuing to improve with IV fluids  Leukocytosis  - secondary to left foot osteomyelitis  - continue to monitor WBC count - Continue IV antibiotics vancomycin and  Zosyn  Anemia  - normocytic - Has remained stable thus far - Cont to monitor  ARF - Cr up to over 2.4 with decreased urine output - suspect contrast nephropathy from yesterday's procedure - Will cont IVF at 100cc/hr - Monitor renal fx - Hold ACEI and other nephrotoxic drugs until renal function improves  Code Status: full Family Communication: Discussed at length with family (wife and daughters)  Disposition Plan: IV antibiotics with transfer to [place] likely Monday for Vascular procedure most likely on Tuesday.  Consultants:  Orthopaedic surgery  Vascular surgery  Procedures:  ABI  MRI of left foot  Arteriogram 10/27/13  L common femoral to tibial peroneal trunk bypass 10/27/13  Amputation of L4th toe and debridement of lateral foot 10/27/13  Antibiotics:  Vancomycin and zosyn  HPI/Subjective: No new complaints. S/p surgery yesterday  Objective: Filed Vitals:   10/29/13 0824  BP: 160/71  Pulse:   Temp: 99.2 F (37.3 C)  Resp: 14    Intake/Output Summary (Last 24 hours) at 10/29/13 0852 Last data filed at 10/29/13 0824  Gross per 24 hour  Intake   3080 ml  Output    845 ml  Net   2235 ml   Filed Weights   10/25/13 0413 10/25/13 0658 10/26/13 0533  Weight: 113.172 kg (249 lb 8 oz) 111.993 kg (246 lb 14.4 oz) 113.898 kg (251 lb 1.6 oz)    Exam:   General:  Pt in NAD, Alert and Awake  Cardiovascular: RRR, no MRG  Respiratory: CTA BL, no wheezes  Abdomen: soft, NT, ND  Musculoskeletal: no cyanosis or clubbing. Patient has ulcer at left foot  Data Reviewed: Basic Metabolic Panel:  Recent Labs  Lab 10/24/13 1334 10/25/13 0623 10/26/13 0520 10/27/13 0430 10/28/13 0015  NA 124* 127* 128*  --  130*  K 4.3 4.3 4.5  --  5.1  CL 92* 98 96  --  99  CO2 19 21 20   --  20  GLUCOSE 153* 188* 95  --  114*  BUN 20 16 13   --  14  CREATININE 1.53* 1.47* 1.48* 1.43* 2.43*  CALCIUM 8.1* 7.9* 8.2*  --  7.9*   Liver Function Tests: No  results found for this basename: AST, ALT, ALKPHOS, BILITOT, PROT, ALBUMIN,  in the last 168 hours No results found for this basename: LIPASE, AMYLASE,  in the last 168 hours No results found for this basename: AMMONIA,  in the last 168 hours CBC:  Recent Labs Lab 10/25/13 0623 10/26/13 0520 10/27/13 0430 10/28/13 0015  WBC 10.5 11.1* 10.5 14.4*  HGB 9.0* 9.5* 9.8* 10.2*  HCT 24.9* 26.9* 27.6* 29.7*  MCV 85.9 86.5 88.7 90.8  PLT 302 332 366 396   Cardiac Enzymes: No results found for this basename: CKTOTAL, CKMB, CKMBINDEX, TROPONINI,  in the last 168 hours BNP (last 3 results) No results found for this basename: PROBNP,  in the last 8760 hours CBG:  Recent Labs Lab 10/28/13 0751 10/28/13 1126 10/28/13 1629 10/28/13 2150 10/29/13 0719  GLUCAP 153* 108* 155* 100* 71    Recent Results (from the past 240 hour(s))  URINE CULTURE     Status: None   Collection Time    10/22/13  3:10 PM      Result Value Range Status   Specimen Description URINE, RANDOM   Final   Special Requests NONE   Final   Culture  Setup Time     Final   Value: 10/22/2013 21:54     Performed at [place]     Final   Value: NO GROWTH     Performed at [facility]   Culture     Final   Value: NO GROWTH     Performed at [facility]   Report Status 10/23/2013 FINAL   Final  MRSA PCR SCREENING     Status: None   Collection Time    10/26/13  9:51 PM      Result Value Range Status   MRSA by PCR NEGATIVE  NEGATIVE Final   Comment:            The GeneXpert MRSA Assay (FDA     approved for NASAL specimens     only), is one component of a     comprehensive MRSA colonization     surveillance program. It is not     intended to diagnose MRSA     infection nor to guide or     monitor treatment for     MRSA infections.     Studies: Dg Chest Portable 1 View  10/27/2013   CLINICAL DATA:  Rule out foreign object from surgery. Possible chipped tooth during  induction.  EXAM: PORTABLE CHEST - 1 VIEW  COMPARISON:  08/17/2013  FINDINGS: Endotracheal tube tip projects approximately 3 cm above the carina. Multiple monitoring leads overlie the upper and mid chest. Lung volumes are mildly diminished with mild central pulmonary vascular congestion. There is oblong opacity in the retrocardiac left lung base. The cardiac silhouette is enlarged. No radiopaque foreign body is identified.  IMPRESSION: 1. No radiopaque foreign body identified in the chest. 2. Endotracheal tube in satisfactory  position. 3. Mildly diminished lung volumes with central pulmonary vascular congestion and left basilar opacity, likely atelectasis.   Electronically Signed   By: Logan Bores   On: 10/27/2013 14:00   Dg Ang/ext/uni/or Left  10/27/2013   CLINICAL DATA:  Left lower extremity femoral to tibioperoneal trunk bypass for perform serial occlusive disease with nonhealing wound of left foot with osteomyelitis.  EXAM: LEFT ANG/EXT/UNI/ OR  TECHNIQUE: Intraoperative image was obtained.  CONTRAST:  See operative report.  COMPARISON:  Arteriography on 10/23/2013.  FLUOROSCOPY TIME:  See operative report.  FINDINGS: Lower leg image obtained intraoperatively shows the distal and of a saphenous vein graft with anastomosis to the tibioperoneal trunk below the knee. Injected contrast opacified is the native anterior tibial artery and posterior tibial artery with flow identified to the level of the ankle. The peroneal artery is open but demonstrates significant segments of stenosis and disease.  IMPRESSION: Open distal bypass graft with anastomosis to the tibioperoneal trunk below the knee. Anterior tibial and posterior tibial runoff is visualized. The peroneal artery is diffusely diseased.   Electronically Signed   By: Aletta Edouard M.D.   On: 10/27/2013 14:05    Scheduled Meds: . aspirin EC  81 mg Oral Daily  . docusate sodium  100 mg Oral Daily  . glimepiride  4 mg Oral QAC breakfast  . heparin  subcutaneous  5,000 Units Subcutaneous Q8H  . insulin aspart  0-15 Units Subcutaneous TID WC  . linagliptin  5 mg Oral Q supper  . metoprolol succinate  100 mg Oral Daily  . multivitamin with minerals  1 tablet Oral Daily  . pantoprazole  40 mg Oral Daily  . piperacillin-tazobactam (ZOSYN)  IV  3.375 g Intravenous Q8H  . sodium chloride  3 mL Intravenous Q12H  . vancomycin  1,500 mg Intravenous Q24H   Continuous Infusions: . sodium chloride 100 mL/hr at 10/29/13 0800    Principal Problem:   Osteomyelitis of left foot Active Problems:   DIABETES MELLITUS, TYPE II   HYPERTENSION   Leukocytosis   Anemia   PAD (peripheral artery disease)   Acute renal failure  Time spent: > 35 minutes  [place] K  [facility] Pager [phone] If 7PM-7AM, please contact night-coverage at [email or web address], password [facility] 10/29/2013, 8:52 AM  LOS: 8 days

## 2013-10-29 NOTE — Progress Notes (Signed)
VASCULAR LAB PRELIMINARY  ARTERIAL  ABI completed:    RIGHT    LEFT    PRESSURE WAVEFORM  PRESSURE WAVEFORM  BRACHIAL 179 Triphasic BRACHIAL 182 Triphasic  DP 161 Triphasic DP 116 Biphasic  PT 173 Triphasic PT 121 Biphasic    RIGHT LEFT  ABI 0.95 0.66   ABIs indicate a normal arterial flow on the right and a moderate decrease on the left. Doppler waveforms are within normal limits bilaterally  Alexander [name], RVS 10/29/2013, 9:33 AM

## 2013-10-29 NOTE — Progress Notes (Signed)
Occupational Therapy Treatment Patient Details Name: Alexander [name] MRN: [id number] DOB: October 26, 1961 Today's Date: 10/29/2013 Time: [id number] OT Time Calculation (min): 13 min  OT Assessment / Plan / Recommendation  History of present illness s/p fem-pop bypass, 2* PVD, L 4th toe amputation and 5th metatarsal mid-shaft amputation.   OT comments  Pt making excellent progress. Completed education regarding AE for ADL. Pt issued long handled sponge and reacher for home use due to financial constraints. Discussed home safety. Pt demonstrated understanding. All goals met.  Follow Up Recommendations  No OT follow up    Barriers to Discharge       Equipment Recommendations  3 in 1 bedside comode    Recommendations for Other Services    Frequency Min 3X/week   Progress towards OT Goals Progress towards OT goals: Goals met/education completed, patient discharged from [place] Discharge plan remains appropriate;All goals met and education completed, patient discharged from OT services    Precautions / Restrictions Precautions Precautions: Fall Restrictions Weight Bearing Restrictions: Yes LLE Weight Bearing:  (weight bearing through heel only with DARCO shoe) Other Position/Activity Restrictions: heel weight bearing on DARCO shoe   Pertinent Vitals/Pain no apparent distress     ADL  Toilet Transfer: Min Psychiatric nurse Method: Other (comment) (ambulating) Science writer: Other (comment) (simulated with recliner) Toileting - Clothing Manipulation and Hygiene: Supervision/safety Where Assessed - Toileting Clothing Manipulation and Hygiene: Sit to stand from 3-in-1 or toilet Equipment Used: Rolling walker;Gait belt;Other (comment) (Darco shoe) Transfers/Ambulation Related to ADLs: min guard ADL Comments: Educated pt on use of AE for LB ADL. Pt able to complete ADL with set up usig AE.     OT Diagnosis:    OT Problem List:   OT Treatment Interventions:     OT  Goals(current goals can now be found in the care plan section) Acute Rehab OT Goals Patient Stated Goal: Home with family. OT Goal Formulation: With patient Time For Goal Achievement: 11/05/13 Potential to Achieve Goals: Good ADL Goals Pt Will Perform Grooming: with supervision;standing Pt Will Transfer to Toilet: with supervision;ambulating Pt Will Perform Toileting - Clothing Manipulation and hygiene: with modified independence;sitting/lateral leans Pt Will Perform Tub/Shower Transfer: with supervision;ambulating;rolling walker  Visit Information  Last OT Received On: 10/29/13 Assistance Needed: +1 History of Present Illness: s/p fem-pop bypass, 2* PVD, L 4th toe amputation and 5th metatarsal mid-shaft amputation.    Subjective Data      Prior Functioning       Cognition  Cognition Arousal/Alertness: Awake/alert Behavior During Therapy: WFL for tasks assessed/performed Overall Cognitive Status: Within Functional Limits for tasks assessed    Mobility  Bed Mobility Bed Mobility: Supine to Sit;Sitting - Scoot to Edge of Bed Supine to Sit: 7: Independent Sitting - Scoot to [employer] of Bed: 7: Independent Transfers Transfers: Sit to Stand;Stand to Sit Sit to Stand: 5: Supervision Stand to Sit: 5: Supervision    Exercises  Other Exercises Other Exercises: encouraged pt to keep LLE elevated . encouraged ankle pumps   Balance     End of Session OT - End of Session Equipment Utilized During Treatment: Gait belt;Rolling walker Activity Tolerance: Patient tolerated treatment well Patient left: Other (comment) Nurse Communication: Mobility status  GO     Valoria Tamburri,HILLARY 10/29/2013, 1:08 PM [facility], OTR/L  [phone] 10/29/2013

## 2013-10-29 NOTE — Progress Notes (Signed)
Utilization review completed.  

## 2013-10-29 NOTE — Progress Notes (Addendum)
Attempted to call 5N for report again, no answer. Will try back in 10 min

## 2013-10-29 NOTE — Progress Notes (Signed)
Physical Therapy Treatment Patient Details Name: Alexander [name] MRN: [id number] DOB: 1961-05-26 Today's Date: 10/29/2013 Time: 1208-1223 PT Time Calculation (min): 15 min  PT Assessment / Plan / Recommendation  History of Present Illness s/p fem-pop bypass, 2* PVD, L 4th toe amputation and 5th metatarsal mid-shaft amputation.   PT Comments   Patient unable to ambulated NWB, but demo little more heel weight bearing walking without Darco and keeping left foot well ahead of walker and significantly shortening right step length.  Limited due to heel cord tightness and swelling.  Will not be able to pay for HHPT so simply recommend home with family assist.  Follow Up Recommendations  No PT follow up;Supervision/Assistance - 24 hour     Does the patient have the potential to tolerate intense rehabilitation   N/a  Barriers to Discharge  None      Equipment Recommendations  3in1 (PT) (depending on pt preference due to financial issues)    Recommendations for Other Services  None  Frequency Min 3X/week   Progress towards PT Goals Progress towards PT goals: Progressing toward goals  Plan Current plan remains appropriate    Precautions / Restrictions Precautions Precautions: Fall Restrictions Weight Bearing Restrictions: Yes LLE Weight Bearing:  (weight bearing through heel only with DARCO shoe) Other Position/Activity Restrictions: heel weight bearing on DARCO shoe   Pertinent Vitals/Pain No pain complaints    Mobility  Bed Mobility Bed Mobility: Not assessed Supine to Sit: 7: Independent Sitting - Scoot to Edge of Bed: 7: Independent Details for Bed Mobility Assistance: up in chair Transfers Sit to Stand: 5: Supervision;From chair/3-in-1 Stand to Sit: 5: Supervision;To chair/3-in-1;With upper extremity assist Ambulation/Gait Ambulation/Gait Assistance: 4: Min guard Ambulation Distance (Feet): 25 Feet Assistive device: Rolling walker Ambulation/Gait Assistance Details: cues to  keep left leg ahead of walker and to shorten right step to allow more heel weight bearing (demo NWB, pt unable to do safely.) Gait Pattern: Step-to pattern;Right foot flat;Trunk flexed;Decreased step length - right;Decreased stance time - left    Exercises Other Exercises Other Exercises: encouraged pt to keep LLE elevated . encouraged ankle pumps    PT Goals (current goals can now be found in the care plan section) Acute Rehab PT Goals Patient Stated Goal: Home with family.  Visit Information  Last PT Received On: 10/29/13 Assistance Needed: +1 History of Present Illness: s/p fem-pop bypass, 2* PVD, L 4th toe amputation and 5th metatarsal mid-shaft amputation.    Subjective Data  Patient Stated Goal: Home with family.   Cognition  Cognition Arousal/Alertness: Awake/alert Behavior During Therapy: WFL for tasks assessed/performed Overall Cognitive Status: Within Functional Limits for tasks assessed    Balance  Static Standing Balance Static Standing - Balance Support: Bilateral upper extremity supported Static Standing - Level of Assistance: 5: Stand by assistance  End of Session PT - End of Session Equipment Utilized During Treatment: Gait belt Activity Tolerance: Patient tolerated treatment well Patient left: in chair;with call bell/phone within reach   GP     [facility] 10/29/2013, 2:39 PM [place], [place] 10/29/2013

## 2013-10-29 NOTE — Progress Notes (Addendum)
Attempted to call 5N to give report, left on hold for extended time. Will try again later

## 2013-10-29 NOTE — Consult Note (Signed)
  Patient is status post revascularization left lower extremity. He is status post amputation of the left fourth and fifth rays.  Examination shows excellent skin turgor and temperature. He has good beefy granulation tissue in the open surgical wound. There is exposed third metatarsal distal in the wound.  Assessment well-perfused foot status post revascularization with exposed third metatarsal and status post open wound from the fourth and fifth ray amputations. Plan feel the best option for foot salvage would be a transmetatarsal amputation. Patient may require skin graft at the base of the fifth metatarsal area may require a wound VAC but patient may be able to have adequate coverage with skin to cover a transmetatarsal amputation.

## 2013-10-29 NOTE — Progress Notes (Signed)
10/29/13, 8:38PM  Attempted to call and receive report from [place] unit from [place], RN. Spoke with staff and they will leave a message for RN to return my phone call for report regarding patient's transfer to 5N.  10/29/13, 8:45PM Received a return phone call from [place], RN and also received report. Will await patient's arrival to [place].

## 2013-10-29 NOTE — Progress Notes (Signed)
Report called, Transferred Pt to 5N14 Pt stable no complaints.

## 2013-10-29 NOTE — Consult Note (Signed)
Renal Service Consult Note [facility]  Alexander [name] 10/29/2013 Alexander [name] Requesting Physician:  Dr Wyline Copas  Reason for Consult:   HPI: The patient is a 52 y.o. year-old with hx as below presented 10/21/13 with worsening of left chronic foot wounds, fever, severe pain in foot and darkening of 4th toe. Admitted and started on IV abx with vanc and zosyn.  Also was on lisinopril, metoprolol, PPI.  Creatinine on admit was 1.19 on admission, up to 1.47 on 12/13 then stable until up to 2.43 on 12/17 yesterday.  Today up to 3.94.  He rec'd IV contrast ~200 cc on 12/12 and a smaller amount 17 cc on 12/16 w arteriograms.  Creatinine was 0.72 in October.   Pt without complaints  Chart Review 06/2012 > lumbar spine surgery L2- L5,  HTN , DM, tobacco, PVD, etoh 08/19/2013 > Left foot 5th ray amp for osteo/cellulitis 10/27/2013 > LLE bypass graft surgery by [facility], a-gram and ray amp of 4th toe with I&D lateral foot  ROS  no abd pain, n/v/d  no rash  no sob or cp  Past Medical History  Past Medical History  Diagnosis Date  . Hypertension   . Dysrhythmia     h/o pvc's  . Arthritis   . Diabetes mellitus     dx---been  awhile   Past Surgical History  Past Surgical History  Procedure Laterality Date  . Lumbar laminectomy  06/13/2012    Procedure: MICRODISCECTOMY LUMBAR LAMINECTOMY;  Surgeon: Jessy Oto, MD;  Location: [place];  Service: Orthopedics;  Laterality: N/A;  Central laminectomy L2-3, L3-4, L4-5  . Amputation Left 08/19/2013    Procedure: AMPUTATION RAY;  Surgeon: Alta Corning, MD;  Location: [place];  Service: Orthopedics;  Laterality: Left;  ray amputation left 5th  . Femoral-tibial bypass graft Left 10/27/2013    Procedure: BYPASS GRAFT LEFT FEMORAL- POSTERIOR TIBIAL ARTERY;  Surgeon: Angelia Mould, MD;  Location: [place];  Service: Vascular;  Laterality: Left;  . I&d extremity Left 10/27/2013    Procedure: IRRIGATION AND DEBRIDEMENT EXTREMITY- LEFT FOOT;   Surgeon: Angelia Mould, MD;  Location: [place];  Service: Vascular;  Laterality: Left;  . Amputation Left 10/27/2013    Procedure: AMPUTATION DIGIT-LEFT 4TH TOE, 4th and 5th metatarsal.;  Surgeon: Angelia Mould, MD;  Location: [place];  Service: Vascular;  Laterality: Left;   Family History History reviewed. No pertinent family history. Social History  reports that he has been smoking Cigarettes.  He has a 45 pack-year smoking history. He has never used smokeless tobacco. He reports that he drinks about 21.0 ounces of alcohol per week. He reports that he does not use illicit drugs. Allergies  Allergies  Allergen Reactions  . Sulfa Antibiotics Rash   Home medications Prior to Admission medications   Medication Sig Start Date End Date Taking? Authorizing Provider  acetaminophen-codeine (TYLENOL #3) 300-30 MG per tablet Take 2 tablets by mouth every 4 (four) hours as needed for moderate pain.   Yes Historical Provider, MD  aspirin EC 81 MG tablet Take 81 mg by mouth daily.   Yes Historical Provider, MD  glimepiride (AMARYL) 4 MG tablet Take 4 mg by mouth daily before breakfast.   Yes Historical Provider, MD  lisinopril (PRINIVIL,ZESTRIL) 20 MG tablet Take 20 mg by mouth 2 (two) times daily.    Yes Historical Provider, MD  metoprolol succinate (TOPROL-XL) 100 MG 24 hr tablet Take 100 mg by mouth daily.  Yes Historical Provider, MD  Multiple Vitamins-Minerals (MULTIVITAMINS THER. W/MINERALS) TABS Take 1 tablet by mouth daily. Men's One a Day   Yes Historical Provider, MD  sitaGLIPtin-metformin (JANUMET) 50-1000 MG per tablet Take 2 tablets by mouth daily with supper.   Yes Historical Provider, MD   Liver Function Tests No results found for this basename: AST, ALT, ALKPHOS, BILITOT, PROT, ALBUMIN,  in the last 168 hours No results found for this basename: LIPASE, AMYLASE,  in the last 168 hours CBC  Recent Labs Lab 10/26/13 0520 10/27/13 0430 10/28/13 0015  WBC 11.1* 10.5  14.4*  HGB 9.5* 9.8* 10.2*  HCT 26.9* 27.6* 29.7*  MCV 86.5 88.7 90.8  PLT 332 366 [id number]   Basic Metabolic Panel  Recent Labs Lab 10/24/13 1334 10/25/13 0623 10/26/13 0520 10/27/13 0430 10/28/13 0015 10/29/13 1005  NA 124* 127* 128*  --  130* 127*  K 4.3 4.3 4.5  --  5.1 4.6  CL 92* 98 96  --  99 98  CO2 19 21 20   --  20 18*  GLUCOSE 153* 188* 95  --  114* 155*  BUN 20 16 13   --  14 21  CREATININE 1.53* 1.47* 1.48* 1.43* 2.43* 3.94*  CALCIUM 8.1* 7.9* 8.2*  --  7.9* 7.5*    Exam  Blood pressure 159/73, pulse 71, temperature 98.2 F (36.8 C), temperature source Oral, resp. rate 18, height 6' (1.829 m), weight 113.898 kg (251 lb 1.6 oz), SpO2 97.00%.  gen: alert, up in chair  skin: no rash, cyanosis  heent: eomi, sclera anicteric, throat clear  neck: ++ jvd  chest: rales bilat bases  cor: regular, no M or rub  abd: soft, nt, nd, no ascites  ext: 2+ bilat pitting LE edema, no joint effusion, L foot lateral large area of tissue loss, open wound  neuro: alert, ox3, nonfocal  Assessment/Plan: 1. Acute kidney injury: suspect contrast nephropathy (+acei) and/or vanc toxicity. Suggest d/c vanc (done) and follow clinically. Repeat UA, urine Na. Avoid further contrast and other nephrotoxins.  Keep sbp over 110. Treat vol overload. Will follow. Check urine prot:creat ratio.  2. L foot ulcer / left leg fem-tib/peroneal bypass 12/16: f/b ortho and vascular 3. DM, longstanding 4. Volume overload: rales, jvd, sig LE edema; will stop fluids and give IV lasix x 1. Up 5kg from admit  Will follow.     Kelly Splinter MD (pgr) [phone]    ([phone] 10/29/2013, 5:04 PM

## 2013-10-29 NOTE — Progress Notes (Addendum)
[facility] Progress Note  10/29/2013 7:41 AM 2 Days Post-Op  Subjective:  C/o soreness in leg  Afebrile VSS  Filed Vitals:   10/29/13 0423  BP: 149/75  Pulse: 74  Temp: 97.5 F (36.4 C)  Resp: 13    Physical Exam: Incisions:  C/d/i - healing nicely Extremities:  2+ edema LLE; + doppler signal in the left AT/peroneal  CBC    Component Value Date/Time   WBC 14.4* 10/28/2013 0015   RBC 3.27* 10/28/2013 0015   RBC 3.32* 10/25/2013 1636   HGB 10.2* 10/28/2013 0015   HCT 29.7* 10/28/2013 0015   PLT 396 10/28/2013 0015   MCV 90.8 10/28/2013 0015   MCH 31.2 10/28/2013 0015   MCHC 34.3 10/28/2013 0015   RDW 12.2 10/28/2013 0015   LYMPHSABS 1.4 10/21/2013 2007   MONOABS 1.2* 10/21/2013 2007   EOSABS 0.0 10/21/2013 2007   BASOSABS 0.0 10/21/2013 2007    BMET    Component Value Date/Time   NA 130* 10/28/2013 0015   K 5.1 10/28/2013 0015   CL 99 10/28/2013 0015   CO2 20 10/28/2013 0015   GLUCOSE 114* 10/28/2013 0015   BUN 14 10/28/2013 0015   CREATININE 2.43* 10/28/2013 0015   CALCIUM 7.9* 10/28/2013 0015   GFRNONAA 29* 10/28/2013 0015   GFRAA 33* 10/28/2013 0015    INR    Component Value Date/Time   INR 1.25 10/21/2013 2007     Intake/Output Summary (Last 24 hours) at 10/29/13 0741 Last data filed at 10/29/13 0424  Gross per 24 hour  Intake   1075 ml  Output    510 ml  Net    565 ml     Assessment:  52 y.o. male is s/p:  1. Left common femoral to tibial peroneal trunk bypass with non-reversed translocated saphenous vein graft  2. Intraoperative arteriogram  3. Ray amputation of the left fourth toe and debridement of lateral foot  2 Days Post-Op  Plan: -pt doing well this am-bypass is patent -pt with significant increase in creatinine from 1.43 to 2.43 yesterday.  Pharmacy dosing ABx -spoke with Dr. Donnetta Hutching, given pt had IV contrast intraoperatively,continue IVF at 100cc/hr and change Lovenox to heparin SQ.  Will check stat BMET  this am -check labs in am -DVT prophylaxis:  Lovenox-will d/c and start SQ heparin 5000 q 8 hrs -continue mobilization/PT -okay to transfer to 2W from surgery standpoint    Leontine Locket, PA-C [facility] [phone] 10/29/2013 7:41 AM    I have examined the patient, reviewed and agree with above. Palpable graft pulse the popliteal level and audible Doppler signal. Foot is pink and well perfused. Concerning regarding elevation of creatinine. Suspect this is related to contrast load. Will hydrate and recheck in the morning.  Corbin Falck, MD 10/29/2013 8:12 AM

## 2013-10-29 NOTE — Progress Notes (Addendum)
Pt's creatinine up yesterday.  No labs were ordered today.  Ordered stat BMP and creatinine up to 3.94 from 2.43 yesterday.  Renal consult obtained.  Spoke with Sonnie Alamo, PA-C.  Claudean Kinds, [facility] 10/29/2013 2:30 PM

## 2013-10-29 NOTE — Progress Notes (Signed)
Subjective: Feeling better with not much pain   Objective: Vital signs in last 24 hours: Temp:  [97.5 F (36.4 C)-98.2 F (36.8 C)] 97.5 F (36.4 C) (12/18 0423) Pulse Rate:  [70-74] 74 (12/18 0423) Resp:  [13-15] 13 (12/18 0423) BP: (134-155)/(66-75) 149/75 mmHg (12/18 0423) SpO2:  [99 %-100 %] 99 % (12/18 0423)  Intake/Output from previous day: 12/17 0701 - 12/18 0700 In: 1075 [P.O.:100; I.V.:975] Out: 510 [Urine:510] Intake/Output this shift:     Recent Labs  10/27/13 0430 10/28/13 0015  HGB 9.8* 10.2*    Recent Labs  10/27/13 0430 10/28/13 0015  WBC 10.5 14.4*  RBC 3.11* 3.27*  HCT 27.6* 29.7*  PLT 366 396    Recent Labs  10/27/13 0430 10/28/13 0015  NA  --  130*  K  --  5.1  CL  --  99  CO2  --  20  BUN  --  14  CREATININE 1.43* 2.43*  GLUCOSE  --  114*  CALCIUM  --  7.9*   No results found for this basename: LABPT, INR,  in the last 72 hours  Neurologically intact Dorsiflexion/Plantar flexion intact foot wound looks good laterally but fairly open.  Necrotic tissue at top of wound with obvious wide wound.  Looks like 3rd toe will likely need removal to get to a closure ,  Assessment/Plan: Will discuss case with Dr [name] and plan for further debridement vs transmet amp.  Decision to be made later today and surgery tomorrow.   Katryn Plummer L 10/29/2013, 8:07 AM

## 2013-10-29 NOTE — Progress Notes (Signed)
Occupational Therapy Treatment Patient Details Name: Alexander [name] MRN: [id number] DOB: January 26, 1961 Today's Date: 10/29/2013 Time: [id number] OT Time Calculation (min): 28 min  OT Assessment / Plan / Recommendation  History of present illness s/p fem-pop bypass, 2* PVD, L 4th toe amputation and 5th metatarsal mid-shaft amputation.   OT comments  Pt making good progress. Able to ambulated short distances for ADL with only c/o pain @ incisional sites for LLE. Pt will benefit from [facility]. Discussed home safety and use of AE to increase independence with ADL. Will see agin to teach use of AE and maximize safety with mobility. Pt states he will not be able to afford home health as he does not have ins.   Follow Up Recommendations  No OT follow up    Barriers to Discharge       Equipment Recommendations  3 in 1 bedside comode (pt trying to find a loaner commode from friend)    Recommendations for Other Services    Frequency Min 3X/week   Progress towards OT Goals Progress towards OT goals: Progressing toward goals  Plan Discharge plan needs to be updated;Frequency remains appropriate;Other (comment) (Pt has not ins and can not afford home health)    Precautions / Restrictions Precautions Precautions: Fall Restrictions Weight Bearing Restrictions: Yes LLE Weight Bearing:  (weight bearing through heel only with DARCO shoe) Other Position/Activity Restrictions: heel weight bearing on [facility] shoe   Pertinent Vitals/Pain C/o pain @ incisional site. nsg aware    ADL  Toilet Transfer: Min guard Toilet Transfer Method: Other (comment) (ambulating) Toilet Transfer Equipment: Other (comment) (simulated with recliner) Toileting - Clothing Manipulation and Hygiene: Supervision/safety Where Assessed - Toileting Clothing Manipulation and Hygiene: Sit to stand from 3-in-1 or toilet Equipment Used: Rolling walker;Gait belt;Other (comment) (Darco shoe) Transfers/Ambulation Related to ADLs: min  guard ADL Comments: vc on use of RW and sequence to relieve pressure from L foot    OT Diagnosis:    OT Problem List:   OT Treatment Interventions:     OT Goals(current goals can now be found in the care plan section) Acute Rehab OT Goals Patient Stated Goal: Home with family. OT Goal Formulation: With patient Time For Goal Achievement: 11/05/13 Potential to Achieve Goals: Good ADL Goals Pt Will Perform Grooming: with supervision;standing Pt Will Transfer to Toilet: with supervision;ambulating Pt Will Perform Toileting - Clothing Manipulation and hygiene: with modified independence;sitting/lateral leans Pt Will Perform Tub/Shower Transfer: with supervision;ambulating;rolling walker  Visit Information  Last OT Received On: 10/29/13 Assistance Needed: +1 History of Present Illness: s/p fem-pop bypass, 2* PVD, L 4th toe amputation and 5th metatarsal mid-shaft amputation.    Subjective Data      Prior Functioning       Cognition  Cognition Arousal/Alertness: Awake/alert Behavior During Therapy: WFL for tasks assessed/performed Overall Cognitive Status: Within Functional Limits for tasks assessed    Mobility  Bed Mobility Bed Mobility: Supine to Sit;Sitting - Scoot to Edge of Bed Supine to Sit: 7: Independent Sitting - Scoot to Edge of Bed: 7: Independent Transfers Transfers: Sit to Stand;Stand to Sit Sit to Stand: 4: Min guard;From bed Stand to Sit: 4: Min guard;To chair/3-in-1    Exercises  Other Exercises Other Exercises: encouraged pt to keep LLE elevated . encouraged ankle pumps   Balance  s for ADL   End of Session OT - End of Session Equipment Utilized During Treatment: Gait belt;Rolling walker Activity Tolerance: Patient tolerated treatment well Patient left: Other (comment) (L leg wound  oozing minimally)  GO     Nathaniel Wakeley,[facility] 10/29/2013, 11:48 AM Maurie Boettcher, OTR/L  [phone] 10/29/2013

## 2013-10-30 ENCOUNTER — Encounter (HOSPITAL_COMMUNITY)
Admission: EM | Disposition: A | Discharge status: Home / Self Care | Payer: Self-pay | Source: Home / Self Care | Attending: Internal Medicine

## 2013-10-30 DIAGNOSIS — E8770 Fluid overload, unspecified: Secondary | ICD-10-CM | POA: Clinically undetermined

## 2013-10-30 LAB — BASIC METABOLIC PANEL
BUN: 24 mg/dL — ABNORMAL HIGH (ref 6–23)
CO2: 18 mEq/L — ABNORMAL LOW (ref 19–32)
Chloride: 101 mEq/L (ref 96–112)
GFR calc Af Amer: 16 mL/min — ABNORMAL LOW (ref >90)
Glucose, Bld: 78 mg/dL (ref 70–99)
Potassium: 4.8 mEq/L (ref 3.5–5.1)

## 2013-10-30 LAB — GLUCOSE, CAPILLARY
Glucose-Capillary: 178 mg/dL — ABNORMAL HIGH (ref 70–99)
Glucose-Capillary: 80 mg/dL (ref 70–99)
Glucose-Capillary: 77 mg/dL (ref 70–99)
Glucose-Capillary: 155 mg/dL — ABNORMAL HIGH (ref 70–99)

## 2013-10-30 LAB — CBC
HCT: 25.8 % — ABNORMAL LOW (ref 39.0–52.0)
Hemoglobin: 8.9 g/dL — ABNORMAL LOW (ref 13.0–17.0)
MCH: 31 pg (ref 26.0–34.0)
MCHC: 34.5 g/dL (ref 30.0–36.0)
MCV: 89.9 fL (ref 78.0–100.0)

## 2013-10-30 LAB — HEPATITIS PANEL, ACUTE
HCV Ab: NEGATIVE
Hep A IgM: NONREACTIVE

## 2013-10-30 LAB — C3 COMPLEMENT: C3 Complement: 114 mg/dL (ref 90–180)

## 2013-10-30 SURGERY — AMPUTATION, FOOT, PARTIAL
Anesthesia: General | Laterality: Left

## 2013-10-30 MED ORDER — FUROSEMIDE 10 MG/ML IJ SOLN
80.0000 mg | Freq: Two times a day (BID) | INTRAMUSCULAR | Status: DC
  Administered 2013-10-31 – 2013-11-02 (×5): 80 mg via INTRAVENOUS
  Filled 2013-10-30 (×7): qty 8

## 2013-10-30 MED ORDER — PIPERACILLIN-TAZOBACTAM IN DEX 2-0.25 GM/50ML IV SOLN
2.2500 g | Freq: Four times a day (QID) | INTRAVENOUS | Status: DC
  Administered 2013-10-30 – 2013-11-01 (×7): 2.25 g via INTRAVENOUS
  Filled 2013-10-30 (×11): qty 50

## 2013-10-30 NOTE — Progress Notes (Signed)
Physical Therapy Treatment Patient Details Name: Alexander [name] MRN: [id number] DOB: November 06, 1961 Today's Date: 10/30/2013 Time: [id number] PT Time Calculation (min): 21 min  PT Assessment / Plan / Recommendation  History of Present Illness s/p fem-pop bypass, 2* PVD, L 4th toe amputation and 5th metatarsal mid-shaft amputation.   PT Comments   Pt pleasant & willing to participate in therapy despite going back to OR later today.  Pt increased ambulation distance but does require cueing to reinforce weightbearing through Lt heel only.  Pt states PT from previous session had him ambulate without darco shoe due to he was able to demonstrate increased heel WBing vs with use of shoe.  See PT note from 10/29/13.     Follow Up Recommendations  No PT follow up;Supervision/Assistance - 24 hour     Does the patient have the potential to tolerate intense rehabilitation     Barriers to Discharge        Equipment Recommendations  3in1 (PT)    Recommendations for Other Services    Frequency Min 3X/week   Progress towards PT Goals Progress towards PT goals: Progressing toward goals  Plan Current plan remains appropriate    Precautions / Restrictions Precautions Precautions: Fall Restrictions Weight Bearing Restrictions: Yes LLE Weight Bearing: Partial weight bearing (WBing through heel only) LLE Partial Weight Bearing Percentage or Pounds: heel only Other Position/Activity Restrictions: heel weight bearing on DARCO shoe       Mobility  Bed Mobility Bed Mobility: Supine to Sit;Sitting - Scoot to Edge of Bed Supine to Sit: 7: Independent Sitting - Scoot to Edge of Bed: 7: Independent Transfers Transfers: Sit to Stand;Stand to Sit Sit to Stand: 5: Supervision;From bed Stand to Sit: 5: Supervision;With upper extremity assist;With armrests;To chair/3-in-1 Details for Transfer Assistance: Cues to reinforce WBing restriction  Ambulation/Gait Ambulation/Gait Assistance: 5:  Supervision Ambulation Distance (Feet): 40 Feet Assistive device: Rolling walker Ambulation/Gait Assistance Details: cues to reinforce WBing through heel only.  Pt states PT from previous session did not have him wear darco shoe because it made him more unsteady & he actually put more weight through front of foot.   Gait Pattern: Step-to pattern General Gait Details: cues to decrease Rt step length to prevent step through gait sequence to keep from [place] through toes of Lt foot.   Stairs: No Wheelchair Mobility Wheelchair Mobility: No     PT Goals (current goals can now be found in the care plan section) Acute Rehab PT Goals PT Goal Formulation: With patient Time For Goal Achievement: 11/04/13 Potential to Achieve Goals: Good  Visit Information  Last PT Received On: 10/30/13 Assistance Needed: +1 History of Present Illness: s/p fem-pop bypass, 2* PVD, L 4th toe amputation and 5th metatarsal mid-shaft amputation.    Subjective Data      Cognition  Cognition Arousal/Alertness: Awake/alert Behavior During Therapy: WFL for tasks assessed/performed Overall Cognitive Status: Within Functional Limits for tasks assessed    Balance     End of Session PT - End of Session Activity Tolerance: Patient tolerated treatment well Patient left: in chair;with call bell/phone within reach Nurse Communication: Mobility status   GP     Sena Hitch 10/30/2013, 11:23 AM   Sarajane Marek, PTA [phone] 10/30/2013

## 2013-10-30 NOTE — Progress Notes (Signed)
Subjective: i feel swollen   Objective: Vital signs in last 24 hours: Temp:  [97.2 F (36.2 C)-98.6 F (37 C)] 97.2 F (36.2 C) (12/19 1400) Pulse Rate:  [71-85] 78 (12/19 1400) Resp:  [18-22] 18 (12/19 1400) BP: (158-176)/(56-89) 169/56 mmHg (12/19 1400) SpO2:  [97 %-100 %] 100 % (12/19 1400) Weight:  [122.108 kg (269 lb 3.2 oz)] 122.108 kg (269 lb 3.2 oz) (12/19 0500)  Intake/Output from previous day: 12/18 0701 - 12/19 0700 In: 4063.3 [P.O.:1200; I.V.:2413.3; IV Piggyback:450] Out: 2700 [Urine:2700] Intake/Output this shift: Total I/O In: 480 [P.O.:480] Out: 1801 [Urine:1800; Stool:1]   Recent Labs  10/28/13 0015 10/30/13 0508  HGB 10.2* 8.9*    Recent Labs  10/28/13 0015 10/30/13 0508  WBC 14.4* 11.8*  RBC 3.27* 2.87*  HCT 29.7* 25.8*  PLT 396 399    Recent Labs  10/29/13 1005 10/30/13 0508  NA 127* 131*  K 4.6 4.8  CL 98 101  CO2 18* 18*  BUN 21 24*  CREATININE 3.94* 4.33*  GLUCOSE 155* 78  CALCIUM 7.5* 8.0*   No results found for this basename: LABPT, INR,  in the last 72 hours  Neurologically intact ABD soft Neurovascular intact Sensation intact distally No cellulitis present Compartment soft markedly edamatous lower extremities  Assessment/Plan: Pt with necroytic tissue after revascularization l leg.  Pt in acute renal failure// Had a discussion with renalbut feel at risk for problems with surgery today.  Will wait til renal situation clarifies itself and then proceed with transmetatarsal amputation/   Garyn Arlotta L 10/30/2013, 3:01 PM

## 2013-10-30 NOTE — Progress Notes (Signed)
ANTIBIOTIC CONSULT NOTE - FOLLOW UP  Pharmacy Consult for zosyn Indication: foor infection, osteo  Allergies  Allergen Reactions  . Sulfa Antibiotics Rash    Patient Measurements: Height: 6' (182.9 cm) Weight: 269 lb 3.2 oz (122.108 kg) IBW/kg (Calculated) : 77.6   Vital Signs: Temp: 98.6 F (37 C) (12/19 0500) BP: 168/82 mmHg (12/19 0500) Pulse Rate: 82 (12/19 0500) Intake/Output from previous day: 12/18 0701 - 12/19 0700 In: 4063.3 [P.O.:1200; I.V.:2413.3; IV Piggyback:450] Out: 2700 [Urine:2700] Intake/Output from this shift: Total I/O In: 240 [P.O.:240] Out: 1300 [Urine:1300]  Labs:  Recent Labs  10/28/13 0015 10/29/13 1005 10/29/13 1823 10/30/13 0508  WBC 14.4*  --   --  11.8*  HGB 10.2*  --   --  8.9*  PLT 396  --   --  399  LABCREA  --   --  85.41  --   CREATININE 2.43* 3.94*  --  4.33*   Estimated Creatinine Clearance: 26.3 ml/min (by C-G formula based on Cr of 4.33). No results found for this basename: Letta Median, VANCORANDOM, [place], GENTPEAK, GENTRANDOM, TOBRATROUGH, TOBRAPEAK, TOBRARND, AMIKACINPEAK, AMIKACINTROU, AMIKACIN,  in the last 72 hours   Microbiology: Recent Results (from the past 720 hour(s))  URINE CULTURE     Status: None   Collection Time    10/22/13  3:10 PM      Result Value Range Status   Specimen Description URINE, RANDOM   Final   Special Requests NONE   Final   Culture  Setup Time     Final   Value: 10/22/2013 21:54     Performed at [facility]     Final   Value: NO GROWTH     Performed at [facility]   Culture     Final   Value: NO GROWTH     Performed at [facility]   Report Status 10/23/2013 FINAL   Final  MRSA PCR SCREENING     Status: None   Collection Time    10/26/13  9:51 PM      Result Value Range Status   MRSA by PCR NEGATIVE  NEGATIVE Final   Comment:            The GeneXpert MRSA Assay (FDA     approved for NASAL specimens     only), is one  component of a     comprehensive MRSA colonization     surveillance program. It is not     intended to diagnose MRSA     infection nor to guide or     monitor treatment for     MRSA infections.    Anti-infectives   Start     Dose/Rate Route Frequency Ordered Stop   10/29/13 0600  vancomycin (VANCOCIN) 1,500 mg in sodium chloride 0.9 % 500 mL IVPB  Status:  Discontinued     1,500 mg 250 mL/hr over 120 Minutes Intravenous Every 24 hours 10/28/13 1119 10/29/13 1209   10/27/13 1645  cefUROXime (ZINACEF) 1.5 g in dextrose 5 % 50 mL IVPB  Status:  Discontinued     1.5 g 100 mL/hr over 30 Minutes Intravenous Every 12 hours 10/27/13 1639 10/27/13 1642   10/22/13 0500  vancomycin (VANCOCIN) IVPB 1000 mg/200 mL premix  Status:  Discontinued     1,000 mg 200 mL/hr over 60 Minutes Intravenous Every 12 hours 10/21/13 1606 10/28/13 1119   10/22/13 0100  piperacillin-tazobactam (ZOSYN) IVPB 3.375 g  3.375 g 12.5 mL/hr over 240 Minutes Intravenous Every 8 hours 10/21/13 1606     10/21/13 1615  vancomycin (VANCOCIN) 2,000 mg in sodium chloride 0.9 % 500 mL IVPB     2,000 mg 250 mL/hr over 120 Minutes Intravenous STAT 10/21/13 1606 10/21/13 2000   10/21/13 1615  piperacillin-tazobactam (ZOSYN) IVPB 3.375 g     3.375 g 100 mL/hr over 30 Minutes Intravenous STAT 10/21/13 1606 10/21/13 1728      Assessment: Patient is a 52 y.o M with foot infection s/p amputation of left 4th & 5th toe, debridement of lateral foot, and angiogram with plan for possible transmetatarsal amputation.  He's also has acute kidney injury with scr continuing to trend up to 4.33 today.    Vanc 12/10 >>12/18 Zosyn 12/10 >>  12/15 MRSA - NEG 12/11 Urine - NEG  Plan:  1) change zosyn to 2.25gm IV q6h 2) monitor renal function closely and adjust dose again if needed  Nannie Starzyk P 10/30/2013,1:03 PM

## 2013-10-30 NOTE — Progress Notes (Addendum)
[facility] [place]  Subjective  - No new complaints.  "Just waiting until foot surgery is done so I can go home."   Objective 168/82 82 98.6 F (37 C) (Oral) 20 99%  Intake/Output Summary (Last 24 hours) at 10/30/13 1307 Last data filed at 10/30/13 1152  Gross per 24 hour  Intake 1581.66 ml  Output   3650 ml  Net -2068.34 ml    Doppler signals AT/peroneal left foot  Dressing in place over foot wound.  No drainage on dressing Mod edema in lower leg   Assessment/Planning: 1. Left common femoral to tibial peroneal trunk bypass with non-reversed translocated saphenous vein graft  2. Intraoperative arteriogram  3. Ray amputation of the left fourth toe and debridement of lateral foot  3 Days Post-Op. Pending further foot debridement per Dr. Berenice Primas. 4. Pending further nephrology work up Cr 4.3.    Laurence Slate [facility] 10/30/2013 1:07 PM --  Laboratory Lab Results:  Recent Labs  10/28/13 0015 10/30/13 0508  WBC 14.4* 11.8*  HGB 10.2* 8.9*  HCT 29.7* 25.8*  PLT 396 399   BMET  Recent Labs  10/29/13 1005 10/30/13 0508  NA 127* 131*  K 4.6 4.8  CL 98 101  CO2 18* 18*  GLUCOSE 155* 78  BUN 21 24*  CREATININE 3.94* 4.33*  CALCIUM 7.5* 8.0*    COAG Lab Results  Component Value Date   INR 1.25 10/21/2013   No results found for this basename: PTT      I have examined the patient, reviewed and agree with above.  Julyan Gales, MD 10/31/2013 8:10 AM

## 2013-10-30 NOTE — Progress Notes (Signed)
[facility] PROGRESS NOTE  Alexander [name] [id number] DOB: [birth year] DOA: 10/21/2013 PCP: No primary provider on file.  Off service summary: Patient is a 52 year old Caucasian male with history of diabetes on oral hypoglycemic agents, hypertension, and history of left foot ulcer. Patient approximately one month ago status post amputation of fifth ray but since operation his wound has not healed well and patient presented with dusky fourth toe and open and draining foul-smelling wound. Was found to have osteomyelitis. Orthopedic surgeon and vascular surgeon on board and plans are for further treatment from a surgical standpoint at [facility].  Upon transfer to [facility], the patient was seen by Orthopedic surgery and ultimately underwent partial L foot amputation on 10/27/13. Included was an intra-operative arteriogram. Post-operatively, the patient was noted to have acute renal failure, likely secondary to contrast nephropathy. He has since been continued on aggressive IVF.    Assessment/Plan: Osteomyelitis of left foot  - Orthopedic surgeon and vascular surgeon on board following - Continue broad-spectrum IV antibiotics at this point - sp L common femoral to tibial peroneal trunk bypass, intraoperative arteriogram, and amputation of L4th toe and debridement of lateral foot Orthopedics/ vascular following and holding off for now until renal function improves.   DIABETES MELLITUS, TYPE II  - Will continue amaryl and januvia - Blood sugars relatively well controlled currently  HYPERTENSION  - continue on metoprolol - Hold ACEI secondary to ARF (see below) - Continue hydralazine 10 mg every 6 hours IV PRN for better BP control  - Will continue current regimen - May consider adding Ca channel blocker if bp still difficult to control  Hyponatremia  - Most likely due to volume overload. Sodium levels improving with diuresis. Continue IV diuretics. IV fluids have been discontinued.    Leukocytosis  - secondary to left foot osteomyelitis  - continue to monitor WBC count - Continue IV antibiotics  Zosyn. Orthopedics and vascular surgery following.  Anemia  - normocytic - Has remained stable thus far - Cont to monitor  ARF - Cr up to over 4.83 with UOP improving with diuretics. - suspect contrast nephropathy and ACE inhibitor, and/or vancomycin toxicity - Continue IV Lasix. - Monitor renal fx - Hold ACEI and other nephrotoxic drugs until renal function improves Renal following and appreciate input and recommendations.  Volume overload Responding to IV diuretics. Follow.  Code Status: full Family Communication: Discussed at length with family (wife and daughters)  Disposition Plan: When medically stable.  Consultants:  Orthopaedic surgery  Vascular surgery  Procedures:  ABI  MRI of left foot  Arteriogram 10/27/13  L common femoral to tibial peroneal trunk bypass 10/27/13  Amputation of L4th toe and debridement of lateral foot 10/27/13  Antibiotics:  Vancomycin d/c'd 10/29/13   zosyn  HPI/Subjective: No new complaints. S/p surgery. Patient states some improvement with scrotal swelling and edema  Objective: Filed Vitals:   10/30/13 1400  BP: 169/56  Pulse: 78  Temp: 97.2 F (36.2 C)  Resp: 18    Intake/Output Summary (Last 24 hours) at 10/30/13 1957 Last data filed at 10/30/13 1700  Gross per 24 hour  Intake   1300 ml  Output   3676 ml  Net  -2376 ml   Filed Weights   10/25/13 0658 10/26/13 0533 10/30/13 0500  Weight: 111.993 kg (246 lb 14.4 oz) 113.898 kg (251 lb 1.6 oz) 122.108 kg (269 lb 3.2 oz)    Exam:   General:  Pt in NAD, Alert and Awake  Cardiovascular: RRR,  no MRG  Respiratory: CTA BL, no wheezes  Abdomen: soft, NT, ND  Musculoskeletal: no cyanosis or clubbing. Patient has ulcer at left foot. 2-3 + BLE edema  Data Reviewed: Basic Metabolic Panel:  Recent Labs Lab 10/25/13 0623 10/26/13 0520  10/27/13 0430 10/28/13 0015 10/29/13 1005 10/30/13 0508  NA 127* 128*  --  130* 127* 131*  K 4.3 4.5  --  5.1 4.6 4.8  CL 98 96  --  99 98 101  CO2 21 20  --  20 18* 18*  GLUCOSE 188* 95  --  114* 155* 78  BUN 16 13  --  14 21 24*  CREATININE 1.47* 1.48* 1.43* 2.43* 3.94* 4.33*  CALCIUM 7.9* 8.2*  --  7.9* 7.5* 8.0*   Liver Function Tests: No results found for this basename: AST, ALT, ALKPHOS, BILITOT, PROT, ALBUMIN,  in the last 168 hours No results found for this basename: LIPASE, AMYLASE,  in the last 168 hours No results found for this basename: AMMONIA,  in the last 168 hours CBC:  Recent Labs Lab 10/25/13 0623 10/26/13 0520 10/27/13 0430 10/28/13 0015 10/30/13 0508  WBC 10.5 11.1* 10.5 14.4* 11.8*  HGB 9.0* 9.5* 9.8* 10.2* 8.9*  HCT 24.9* 26.9* 27.6* 29.7* 25.8*  MCV 85.9 86.5 88.7 90.8 89.9  PLT 302 332 366 396 399   Cardiac Enzymes: No results found for this basename: CKTOTAL, CKMB, CKMBINDEX, TROPONINI,  in the last 168 hours BNP (last 3 results) No results found for this basename: PROBNP,  in the last 8760 hours CBG:  Recent Labs Lab 10/29/13 1509 10/29/13 2233 10/30/13 0646 10/30/13 1129 10/30/13 1603  GLUCAP 128* 89 80 77 166*    Recent Results (from the past 240 hour(s))  URINE CULTURE     Status: None   Collection Time    10/22/13  3:10 PM      Result Value Range Status   Specimen Description URINE, RANDOM   Final   Special Requests NONE   Final   Culture  Setup Time     Final   Value: 10/22/2013 21:54     Performed at [place]     Final   Value: NO GROWTH     Performed at [place]     Final   Value: NO GROWTH     Performed at [facility]   Report Status 10/23/2013 FINAL   Final  MRSA PCR SCREENING     Status: None   Collection Time    10/26/13  9:51 PM      Result Value Range Status   MRSA by PCR NEGATIVE  NEGATIVE Final   Comment:            The GeneXpert MRSA Assay (FDA      approved for NASAL specimens     only), is one component of a     comprehensive MRSA colonization     surveillance program. It is not     intended to diagnose MRSA     infection nor to guide or     monitor treatment for     MRSA infections.     Studies: No results found.  Scheduled Meds: . aspirin EC  81 mg Oral Daily  . docusate sodium  100 mg Oral Daily  . [START ON 10/31/2013] furosemide  80 mg Intravenous BID  . glimepiride  4 mg Oral QAC breakfast  . heparin subcutaneous  5,000 Units Subcutaneous Q8H  . insulin aspart  0-15 Units Subcutaneous TID WC  . linagliptin  5 mg Oral Q supper  . metoprolol succinate  100 mg Oral Daily  . multivitamin with minerals  1 tablet Oral Daily  . pantoprazole  40 mg Oral Daily  . piperacillin-tazobactam (ZOSYN)  IV  2.25 g Intravenous Q6H  . sodium chloride  3 mL Intravenous Q12H   Continuous Infusions:    Principal Problem:   Osteomyelitis of left foot Active Problems:   DIABETES MELLITUS, TYPE II   HYPERTENSION   Leukocytosis   Anemia   PAD (peripheral artery disease)   Acute renal failure   Volume overload  Time spent: > 35 minutes  THOMPSON,DANIEL MD [facility] Pager [phone] If 7PM-7AM, please contact night-coverage at [email or web address], password [facility] 10/30/2013, 7:57 PM  LOS: 9 days

## 2013-10-30 NOTE — Progress Notes (Signed)
Patient ID: Alexander [name], male   DOB: [birth year], 52 y.o.   MRN: [id number] S:no complaints O:BP 169/56  Pulse 78  Temp(Src) 97.2 F (36.2 C) (Oral)  Resp 18  Ht 6' (1.829 m)  Wt 122.108 kg (269 lb 3.2 oz)  BMI 36.50 kg/m2  SpO2 100%  Intake/Output Summary (Last 24 hours) at 10/30/13 1517 Last data filed at 10/30/13 1418  Gross per 24 hour  Intake 1253.33 ml  Output   3901 ml  Net -2647.67 ml   Intake/Output: I/O last 3 completed shifts: In: 4563.3 [P.O.:1200; I.V.:2413.3; IV Piggyback:950] Out: 3075 [Urine:3075]  Intake/Output this shift:  Total I/O In: 480 [P.O.:480] Out: 1801 [Urine:1800; Stool:1] Weight change:  Gen:WD WN WM inNAD CVS:rrr Resp:cta [id number] Ext:2+ pitting edema L>R   Recent Labs Lab 10/24/13 1334 10/25/13 0623 10/26/13 0520 10/27/13 0430 10/28/13 0015 10/29/13 1005 10/30/13 0508  NA 124* 127* 128*  --  130* 127* 131*  K 4.3 4.3 4.5  --  5.1 4.6 4.8  CL 92* 98 96  --  99 98 101  CO2 19 21 20   --  20 18* 18*  GLUCOSE 153* 188* 95  --  114* 155* 78  BUN 20 16 13   --  14 21 24*  CREATININE 1.53* 1.47* 1.48* 1.43* 2.43* 3.94* 4.33*  CALCIUM 8.1* 7.9* 8.2*  --  7.9* 7.5* 8.0*   Liver Function Tests: No results found for this basename: AST, ALT, ALKPHOS, BILITOT, PROT, ALBUMIN,  in the last 168 hours No results found for this basename: LIPASE, AMYLASE,  in the last 168 hours No results found for this basename: AMMONIA,  in the last 168 hours CBC:  Recent Labs Lab 10/25/13 0623 10/26/13 0520 10/27/13 0430 10/28/13 0015 10/30/13 0508  WBC 10.5 11.1* 10.5 14.4* 11.8*  HGB 9.0* 9.5* 9.8* 10.2* 8.9*  HCT 24.9* 26.9* 27.6* 29.7* 25.8*  MCV 85.9 86.5 88.7 90.8 89.9  PLT 302 332 366 396 399   Cardiac Enzymes: No results found for this basename: CKTOTAL, CKMB, CKMBINDEX, TROPONINI,  in the last 168 hours CBG:  Recent Labs Lab 10/29/13 1128 10/29/13 1509 10/29/13 2233 10/30/13 0646 10/30/13 1129  GLUCAP 155* 128* 89 80 77     Iron Studies: No results found for this basename: IRON, TIBC, TRANSFERRIN, FERRITIN,  in the last 72 hours Studies/Results: No results found. Marland Kitchen aspirin EC  81 mg Oral Daily  . docusate sodium  100 mg Oral Daily  . furosemide  80 mg Intravenous Q8H  . glimepiride  4 mg Oral QAC breakfast  . heparin subcutaneous  5,000 Units Subcutaneous Q8H  . insulin aspart  0-15 Units Subcutaneous TID WC  . linagliptin  5 mg Oral Q supper  . metoprolol succinate  100 mg Oral Daily  . multivitamin with minerals  1 tablet Oral Daily  . pantoprazole  40 mg Oral Daily  . piperacillin-tazobactam (ZOSYN)  IV  2.25 g Intravenous Q6H  . sodium chloride  3 mL Intravenous Q12H    BMET    Component Value Date/Time   NA 131* 10/30/2013 0508   K 4.8 10/30/2013 0508   CL 101 10/30/2013 0508   CO2 18* 10/30/2013 0508   GLUCOSE 78 10/30/2013 0508   BUN 24* 10/30/2013 0508   CREATININE 4.33* 10/30/2013 0508   CALCIUM 8.0* 10/30/2013 0508   GFRNONAA 14* 10/30/2013 0508   GFRAA 16* 10/30/2013 0508   CBC    Component Value Date/Time   WBC 11.8* 10/30/2013 [id number]  RBC 2.87* 10/30/2013 0508   RBC 3.32* 10/25/2013 1636   HGB 8.9* 10/30/2013 0508   HCT 25.8* 10/30/2013 0508   PLT 399 10/30/2013 0508   MCV 89.9 10/30/2013 0508   MCH 31.0 10/30/2013 0508   MCHC 34.5 10/30/2013 0508   RDW 12.2 10/30/2013 0508   LYMPHSABS 1.4 10/21/2013 2007   MONOABS 1.2* 10/21/2013 2007   EOSABS 0.0 10/21/2013 2007   BASOSABS 0.0 10/21/2013 2007     Assessment/Plan:  1. Acute kidney injury: non-oliguric.  Multiple renal insults including contrast nephropathy (+acei) and/or vanc toxicity.  1. vanco stopped.   2. Avoid further contrast and other nephrotoxins.  3. Keep sbp over 110.  4. Adjust zosyn dose for renal failure (q8h not q6) 5. Normal complements, low Uprot, low FeNa c/w pre-renal but has marked peripheral edema.  Continue to follow.  6. No urgent indication for HD but agree with holding off on transmet  amputation until he is more stable.  2. L foot ulcer / left leg fem-tib/peroneal bypass 12/16: f/b ortho and vascular  1. For transmet amputation.  Agree with Dr. Berenice Primas to hold off until more stable 3. Volume overload: Responding to IV lasix.  4. Hyponatremia- due to #1 and edema.  Improving after diuresis. 5. ABLA/Anemia- dropping Hgb,  6. DM, longstanding 7.   [place] A

## 2013-10-31 DIAGNOSIS — E8779 Other fluid overload: Secondary | ICD-10-CM

## 2013-10-31 LAB — CBC
MCH: 30.7 pg (ref 26.0–34.0)
MCHC: 34.2 g/dL (ref 30.0–36.0)
Platelets: 486 10*3/uL — ABNORMAL HIGH (ref 150–400)
WBC: 10.2 10*3/uL (ref 4.0–10.5)

## 2013-10-31 LAB — BASIC METABOLIC PANEL
Calcium: 8.2 mg/dL — ABNORMAL LOW (ref 8.4–10.5)
Creatinine, Ser: 4.62 mg/dL — ABNORMAL HIGH (ref 0.50–1.35)
GFR calc Af Amer: 15 mL/min — ABNORMAL LOW (ref >90)
Potassium: 4.1 mEq/L (ref 3.5–5.1)

## 2013-10-31 LAB — TYPE AND SCREEN
Antibody Screen: NEGATIVE
Unit division: 0
Unit division: 0

## 2013-10-31 LAB — GLUCOSE, CAPILLARY
Glucose-Capillary: 129 mg/dL — ABNORMAL HIGH (ref 70–99)
Glucose-Capillary: 136 mg/dL — ABNORMAL HIGH (ref 70–99)

## 2013-10-31 MED ORDER — AMLODIPINE BESYLATE 10 MG PO TABS
10.0000 mg | ORAL_TABLET | Freq: Every day | ORAL | Status: DC
  Administered 2013-10-31 – 2013-11-09 (×9): 10 mg via ORAL
  Filled 2013-10-31 (×10): qty 1

## 2013-10-31 NOTE — Progress Notes (Signed)
Patient ID: CLAYBURN GARBETT, male   DOB: 1961-11-01, 52 y.o.   MRN: [id number] S:no new complaints O:BP 173/69  Pulse 72  Temp(Src) 98.6 F (37 C) (Oral)  Resp 18  Ht 6' (1.829 m)  Wt 122.108 kg (269 lb 3.2 oz)  BMI 36.50 kg/m2  SpO2 99%  Intake/Output Summary (Last 24 hours) at 10/31/13 1058 Last data filed at 10/31/13 0800  Gross per 24 hour  Intake   1760 ml  Output   2051 ml  Net   -291 ml   Intake/Output: I/O last 3 completed shifts: In: [id number] [P.O.:1450; I.V.:90; IV Piggyback:200] Out: 4726 [Urine:4725; Stool:1]  Intake/Output this shift:  Total I/O In: 360 [P.O.:360] Out: -  Weight change:  Gen:WD WN WM in NAD CVS:no rub Resp:cta [id number] Ext:2+ edema on left leg 1+ on right   Recent Labs Lab 10/24/13 1334 10/25/13 0623 10/26/13 0520 10/27/13 0430 10/28/13 0015 10/29/13 1005 10/30/13 0508 10/31/13 0504  NA 124* 127* 128*  --  130* 127* 131* 134*  K 4.3 4.3 4.5  --  5.1 4.6 4.8 4.1  CL 92* 98 96  --  99 98 101 100  CO2 19 21 20   --  20 18* 18* 22  GLUCOSE 153* 188* 95  --  114* 155* 78 121*  BUN 20 16 13   --  14 21 24* 27*  CREATININE 1.53* 1.47* 1.48* 1.43* 2.43* 3.94* 4.33* 4.62*  CALCIUM 8.1* 7.9* 8.2*  --  7.9* 7.5* 8.0* 8.2*   Liver Function Tests: No results found for this basename: AST, ALT, ALKPHOS, BILITOT, PROT, ALBUMIN,  in the last 168 hours No results found for this basename: LIPASE, AMYLASE,  in the last 168 hours No results found for this basename: AMMONIA,  in the last 168 hours CBC:  Recent Labs Lab 10/26/13 0520 10/27/13 0430 10/28/13 0015 10/30/13 0508 10/31/13 0838  WBC 11.1* 10.5 14.4* 11.8* 10.2  HGB 9.5* 9.8* 10.2* 8.9* 9.4*  HCT 26.9* 27.6* 29.7* 25.8* 27.5*  MCV 86.5 88.7 90.8 89.9 89.9  PLT 332 366 396 399 486*   Cardiac Enzymes: No results found for this basename: CKTOTAL, CKMB, CKMBINDEX, TROPONINI,  in the last 168 hours CBG:  Recent Labs Lab 10/30/13 0646 10/30/13 1129 10/30/13 1603 10/30/13 2306  10/31/13 0700  GLUCAP 80 77 166* 155* 129*    Iron Studies: No results found for this basename: IRON, TIBC, TRANSFERRIN, FERRITIN,  in the last 72 hours Studies/Results: No results found. Marland Kitchen aspirin EC  81 mg Oral Daily  . docusate sodium  100 mg Oral Daily  . furosemide  80 mg Intravenous BID  . glimepiride  4 mg Oral QAC breakfast  . heparin subcutaneous  5,000 Units Subcutaneous Q8H  . insulin aspart  0-15 Units Subcutaneous TID WC  . linagliptin  5 mg Oral Q supper  . metoprolol succinate  100 mg Oral Daily  . multivitamin with minerals  1 tablet Oral Daily  . pantoprazole  40 mg Oral Daily  . piperacillin-tazobactam (ZOSYN)  IV  2.25 g Intravenous Q6H  . sodium chloride  3 mL Intravenous Q12H    BMET    Component Value Date/Time   NA 134* 10/31/2013 0504   K 4.1 10/31/2013 0504   CL 100 10/31/2013 0504   CO2 22 10/31/2013 0504   GLUCOSE 121* 10/31/2013 0504   BUN 27* 10/31/2013 0504   CREATININE 4.62* 10/31/2013 0504   CALCIUM 8.2* 10/31/2013 0504   GFRNONAA 13* 10/31/2013 0504  GFRAA 15* 10/31/2013 0504   CBC    Component Value Date/Time   WBC 10.2 10/31/2013 0838   RBC 3.06* 10/31/2013 0838   RBC 3.32* 10/25/2013 1636   HGB 9.4* 10/31/2013 0838   HCT 27.5* 10/31/2013 0838   PLT 486* 10/31/2013 0838   MCV 89.9 10/31/2013 0838   MCH 30.7 10/31/2013 0838   MCHC 34.2 10/31/2013 0838   RDW 12.1 10/31/2013 0838   LYMPHSABS 1.4 10/21/2013 2007   MONOABS 1.2* 10/21/2013 2007   EOSABS 0.0 10/21/2013 2007   BASOSABS 0.0 10/21/2013 2007     Assessment/Plan:  1. Acute kidney injury: non-oliguric. Multiple renal insults including contrast nephropathy (+acei) and/or vanc toxicity. (pre-op Scr 0.8-0.93)  1. vanco stopped.  2. Avoid further contrast and other nephrotoxins.  3. Keep sbp over 110.  4. Adjust zosyn dose for renal failure (q8h not q6) 5. Normal complements, low Uprot, low FeNa c/w pre-renal but has marked peripheral edema. Continue to follow.  6. No  urgent indication for HD but agree with holding off on transmet amputation until he is more stable.  7. Decreased frequency of lasix to help lower the rate of diuresis to avoid further renal insult.  Rate of rise has slowed.  Hopefully will start to see an improvement 2. L foot ulcer / left leg fem-tib/peroneal bypass 12/16: f/b ortho and vascular  1. For transmet amputation. Agree with Dr. Berenice Primas to hold off until more stable 3. Volume overload: Responding to IV lasix.  4. Hyponatremia- due to #1 and edema. Improving after diuresis. 5. ABLA/Anemia- dropping Hgb,  6. DM, longstanding  Alexander [name]

## 2013-10-31 NOTE — Progress Notes (Signed)
Subjective: 4 Days Post-Op Procedure(s) (LRB): BYPASS GRAFT LEFT FEMORAL- POSTERIOR TIBIAL ARTERY (Left) IRRIGATION AND DEBRIDEMENT EXTREMITY- LEFT FOOT (Left) AMPUTATION DIGIT-LEFT 4TH TOE, 4th and 5th metatarsal. (Left) Patient reports pain as mild.    Objective: Vital signs in last 24 hours: Temp:  [97.2 F (36.2 C)-98.6 F (37 C)] 98.6 F (37 C) (12/20 0634) Pulse Rate:  [72-79] 72 (12/20 0634) Resp:  [18] 18 (12/20 0634) BP: (152-173)/(56-69) 173/69 mmHg (12/20 0634) SpO2:  [97 %-100 %] 99 % (12/20 0634)  Intake/Output from previous day: 12/19 0701 - 12/20 0700 In: 1400 [P.O.:1210; I.V.:40; IV Piggyback:150] Out: 2851 [Urine:2850; Stool:1] Intake/Output this shift: Total I/O In: 360 [P.O.:360] Out: -    Recent Labs  10/30/13 0508 10/31/13 0838  HGB 8.9* 9.4*    Recent Labs  10/30/13 0508 10/31/13 0838  WBC 11.8* 10.2  RBC 2.87* 3.06*  HCT 25.8* 27.5*  PLT 399 486*    Recent Labs  10/30/13 0508 10/31/13 0504  NA 131* 134*  K 4.8 4.1  CL 101 100  CO2 18* 22  BUN 24* 27*  CREATININE 4.33* 4.62*  GLUCOSE 78 121*  CALCIUM 8.0* 8.2*   No results found for this basename: LABPT, INR,  in the last 72 hours  dressing intact.   Assessment/Plan: 4 Days Post-Op Procedure(s) (LRB): BYPASS GRAFT LEFT FEMORAL- POSTERIOR TIBIAL ARTERY (Left) IRRIGATION AND DEBRIDEMENT EXTREMITY- LEFT FOOT (Left) AMPUTATION DIGIT-LEFT 4TH TOE, 4th and 5th metatarsal. (Left) pending renal improvement   then foot surgery by Dr. Randa Spike C 10/31/2013, 10:54 AM

## 2013-10-31 NOTE — Progress Notes (Addendum)
[facility] [place]  Subjective  - Pending Orthopedic surgery waiting on kidney stabilization.  He wants to go home as soon as possible.   Objective 173/69 72 98.6 F (37 C) (Oral) 18 99%  Intake/Output Summary (Last 24 hours) at 10/31/13 0846 Last data filed at 10/31/13 [id number]  Gross per 24 hour  Intake   1400 ml  Output   2851 ml  Net  -1451 ml    Doppler signals AT/peroneal left foot  Dressing in place over foot wound. No drainage on dressing  Mod edema in lower leg Left thigh dressing with SS drainage.  No frank purulent drainage.  No erythema.  Dressing changed.  Assessment/Planning: Assessment/Planning:  1. Left common femoral to tibial peroneal trunk bypass with non-reversed translocated saphenous vein graft  2. Intraoperative arteriogram  3. Ray amputation of the left fourth toe and debridement of lateral foot  3 Days Post-Op. Pending further foot debridement per Dr. Berenice Primas.  4. Pending further nephrology work up Cr 4.3 to 4.62 today 5.Left thigh incision drainage.  Patient is on antibiotics IV Zosyn. No fever or chills.     Laurence Slate [facility] 10/31/2013 8:46 AM --  Laboratory Lab Results:  Recent Labs  10/30/13 0508  WBC 11.8*  HGB 8.9*  HCT 25.8*  PLT 399   BMET  Recent Labs  10/30/13 0508 10/31/13 0504  NA 131* 134*  K 4.8 4.1  CL 101 100  CO2 18* 22  GLUCOSE 78 121*  BUN 24* 27*  CREATININE 4.33* 4.62*  CALCIUM 8.0* 8.2*    COAG Lab Results  Component Value Date   INR 1.25 10/21/2013   No results found for this basename: PTT      I have examined the patient, reviewed and agree with above.Creat still rising  Khristi Schiller, MD 10/31/2013 9:37 AM

## 2013-10-31 NOTE — Progress Notes (Signed)
[facility] PROGRESS NOTE  Alexander [name] [id number] DOB: 06-30-1961 DOA: 10/21/2013 PCP: No primary provider on file.  Off service summary: Patient is a 52 year old Caucasian male with history of diabetes on oral hypoglycemic agents, hypertension, and history of left foot ulcer. Patient approximately one month ago status post amputation of fifth ray but since operation his wound has not healed well and patient presented with dusky fourth toe and open and draining foul-smelling wound. Was found to have osteomyelitis. Orthopedic surgeon and vascular surgeon on board and plans are for further treatment from a surgical standpoint at [facility].  Upon transfer to [facility], the patient was seen by Orthopedic surgery and ultimately underwent partial L foot amputation on 10/27/13. Included was an intra-operative arteriogram. Post-operatively, the patient was noted to have acute renal failure, likely secondary to contrast nephropathy. He has since been continued on aggressive IVF.    Assessment/Plan: Osteomyelitis of left foot  - Orthopedic surgeon and vascular surgeon on board following - Continue broad-spectrum IV antibiotics at this point - sp L common femoral to tibial peroneal trunk bypass, intraoperative arteriogram, and amputation of L4th toe and debridement of lateral foot Orthopedics/ vascular following and holding off for now until renal function improves.   DIABETES MELLITUS, TYPE II  - discontinue Amaryl and Januvia. Continue sliding scale insulin. - Blood sugars relatively well controlled currently  HYPERTENSION  - continue on metoprolol - Hold ACEI secondary to ARF (see below) - Continue hydralazine 10 mg every 6 hours IV PRN for better BP control  - Will continue current regimen - start Norvasc10 mg daily and follow.  Hyponatremia  - Most likely due to volume overload. Sodium levels improving with diuresis. Continue IV diuretics. IV fluids have been discontinued.    Leukocytosis  - secondary to left foot osteomyelitis  - continue to monitor WBC count - Continue IV antibiotics  Zosyn. Orthopedics and vascular surgery following.  Anemia  - normocytic - Has remained stable thus far - Cont to monitor  ARF - Cr up to over 4.62 with UOP improving with diuretics. - suspect contrast nephropathy and ACE inhibitor, and/or vancomycin toxicity - Continue IV Lasix. - Monitor renal fx - Hold ACEI and other nephrotoxic drugs until renal function improves Renal following and appreciate input and recommendations.  Volume overload Responding to IV diuretics. Follow.  Code Status: full Family Communication: Discussed patient, no family at bedside Disposition Plan: When medically stable.  Consultants:  Orthopaedic surgery  Vascular surgery  Procedures:  ABI  MRI of left foot  Arteriogram 10/27/13  L common femoral to tibial peroneal trunk bypass 10/27/13  Amputation of L4th toe and debridement of lateral foot 10/27/13  Antibiotics:  Vancomycin d/c'd 10/29/13   zosyn  HPI/Subjective: No new complaints. S/p surgery. Patient states some improvement with scrotal swelling and edema  Objective: Filed Vitals:   10/31/13 0634  BP: 173/69  Pulse: 72  Temp: 98.6 F (37 C)  Resp: 18    Intake/Output Summary (Last 24 hours) at 10/31/13 1040 Last data filed at 10/31/13 0800  Gross per 24 hour  Intake   1760 ml  Output   2051 ml  Net   -291 ml   Filed Weights   10/25/13 0658 10/26/13 0533 10/30/13 0500  Weight: 111.993 kg (246 lb 14.4 oz) 113.898 kg (251 lb 1.6 oz) 122.108 kg (269 lb 3.2 oz)    Exam:   General:  Pt in NAD, Alert and Awake  Cardiovascular: RRR, no MRG  Respiratory: CTA  BL, no wheezes  Abdomen: soft, NT, ND  Musculoskeletal: no cyanosis or clubbing. Patient has ulcer at left foot. 2-3 + BLE edema  Data Reviewed: Basic Metabolic Panel:  Recent Labs Lab 10/26/13 0520 10/27/13 0430 10/28/13 0015  10/29/13 1005 10/30/13 0508 10/31/13 0504  NA 128*  --  130* 127* 131* 134*  K 4.5  --  5.1 4.6 4.8 4.1  CL 96  --  99 98 101 100  CO2 20  --  20 18* 18* 22  GLUCOSE 95  --  114* 155* 78 121*  BUN 13  --  14 21 24* 27*  CREATININE 1.48* 1.43* 2.43* 3.94* 4.33* 4.62*  CALCIUM 8.2*  --  7.9* 7.5* 8.0* 8.2*   Liver Function Tests: No results found for this basename: AST, ALT, ALKPHOS, BILITOT, PROT, ALBUMIN,  in the last 168 hours No results found for this basename: LIPASE, AMYLASE,  in the last 168 hours No results found for this basename: AMMONIA,  in the last 168 hours CBC:  Recent Labs Lab 10/26/13 0520 10/27/13 0430 10/28/13 0015 10/30/13 0508 10/31/13 0838  WBC 11.1* 10.5 14.4* 11.8* 10.2  HGB 9.5* 9.8* 10.2* 8.9* 9.4*  HCT 26.9* 27.6* 29.7* 25.8* 27.5*  MCV 86.5 88.7 90.8 89.9 89.9  PLT 332 366 396 399 486*   Cardiac Enzymes: No results found for this basename: CKTOTAL, CKMB, CKMBINDEX, TROPONINI,  in the last 168 hours BNP (last 3 results) No results found for this basename: PROBNP,  in the last 8760 hours CBG:  Recent Labs Lab 10/30/13 0646 10/30/13 1129 10/30/13 1603 10/30/13 2306 10/31/13 0700  GLUCAP 80 77 166* 155* 129*    Recent Results (from the past 240 hour(s))  URINE CULTURE     Status: None   Collection Time    10/22/13  3:10 PM      Result Value Range Status   Specimen Description URINE, RANDOM   Final   Special Requests NONE   Final   Culture  Setup Time     Final   Value: 10/22/2013 21:54     Performed at [place]     Final   Value: NO GROWTH     Performed at [place]     Final   Value: NO GROWTH     Performed at [facility]   Report Status 10/23/2013 FINAL   Final  MRSA PCR SCREENING     Status: None   Collection Time    10/26/13  9:51 PM      Result Value Range Status   MRSA by PCR NEGATIVE  NEGATIVE Final   Comment:            The GeneXpert MRSA Assay (FDA      approved for NASAL specimens     only), is one component of a     comprehensive MRSA colonization     surveillance program. It is not     intended to diagnose MRSA     infection nor to guide or     monitor treatment for     MRSA infections.     Studies: No results found.  Scheduled Meds: . aspirin EC  81 mg Oral Daily  . docusate sodium  100 mg Oral Daily  . furosemide  80 mg Intravenous BID  . glimepiride  4 mg Oral QAC breakfast  . heparin subcutaneous  5,000 Units Subcutaneous Q8H  . insulin aspart  0-15 Units Subcutaneous TID WC  . linagliptin  5 mg Oral Q supper  . metoprolol succinate  100 mg Oral Daily  . multivitamin with minerals  1 tablet Oral Daily  . pantoprazole  40 mg Oral Daily  . piperacillin-tazobactam (ZOSYN)  IV  2.25 g Intravenous Q6H  . sodium chloride  3 mL Intravenous Q12H   Continuous Infusions:    Principal Problem:   Osteomyelitis of left foot Active Problems:   DIABETES MELLITUS, TYPE II   HYPERTENSION   Leukocytosis   Anemia   PAD (peripheral artery disease)   Acute renal failure   Volume overload  Time spent: > 35 minutes  Mertha Clyatt MD [facility] Pager [phone] If 7PM-7AM, please contact night-coverage at [email or web address], password [facility] 10/31/2013, 10:40 AM  LOS: 10 days

## 2013-10-31 NOTE — Progress Notes (Signed)
Pt's BP running 180/70's for past 4 hours. PRN antihypertensives administered, no decrease in pt's current BP. Pt does NOT complain of any headache/fullness or discomfort. MD notified, new oral antihypertensive meds ordered. Will admin and continue to monitor and assess.

## 2013-11-01 LAB — BASIC METABOLIC PANEL
BUN: 29 mg/dL — ABNORMAL HIGH (ref 6–23)
CO2: 22 mEq/L (ref 19–32)
Chloride: 100 mEq/L (ref 96–112)
Creatinine, Ser: 4.89 mg/dL — ABNORMAL HIGH (ref 0.50–1.35)
GFR calc Af Amer: 14 mL/min — ABNORMAL LOW (ref >90)
Potassium: 4 mEq/L (ref 3.5–5.1)

## 2013-11-01 LAB — GLUCOSE, CAPILLARY
Glucose-Capillary: 122 mg/dL — ABNORMAL HIGH (ref 70–99)
Glucose-Capillary: 133 mg/dL — ABNORMAL HIGH (ref 70–99)
Glucose-Capillary: 116 mg/dL — ABNORMAL HIGH (ref 70–99)

## 2013-11-01 LAB — CBC
HCT: 23.9 % — ABNORMAL LOW (ref 39.0–52.0)
Hemoglobin: 8.3 g/dL — ABNORMAL LOW (ref 13.0–17.0)
MCHC: 34.7 g/dL (ref 30.0–36.0)
MCV: 89.2 fL (ref 78.0–100.0)
RBC: 2.68 MIL/uL — ABNORMAL LOW (ref 4.22–5.81)
RDW: 12.3 % (ref 11.5–15.5)

## 2013-11-01 MED ORDER — HYDRALAZINE HCL 25 MG PO TABS
25.0000 mg | ORAL_TABLET | Freq: Three times a day (TID) | ORAL | Status: DC
  Administered 2013-11-01 – 2013-11-02 (×4): 25 mg via ORAL
  Filled 2013-11-01 (×7): qty 1

## 2013-11-01 MED ORDER — PIPERACILLIN-TAZOBACTAM IN DEX 2-0.25 GM/50ML IV SOLN
2.2500 g | Freq: Three times a day (TID) | INTRAVENOUS | Status: DC
  Administered 2013-11-01 – 2013-11-09 (×24): 2.25 g via INTRAVENOUS
  Filled 2013-11-01 (×28): qty 50

## 2013-11-01 NOTE — Progress Notes (Signed)
Patient ID: Alexander [name], male   DOB: [birth year], 52 y.o.   MRN: [id number] S:feels good O:BP 168/63  Pulse 73  Temp(Src) 98.8 F (37.1 C) (Oral)  Resp 20  Ht 6' (1.829 m)  Wt 122.108 kg (269 lb 3.2 oz)  BMI 36.50 kg/m2  SpO2 98%  Intake/Output Summary (Last 24 hours) at 11/01/13 1102 Last data filed at 11/01/13 0749  Gross per 24 hour  Intake   1200 ml  Output   2800 ml  Net  -1600 ml   Intake/Output: I/O last 3 completed shifts: In: 1520 [P.O.:1330; I.V.:40; IV Piggyback:150] Out: [id number] [Urine:3725]  Intake/Output this shift:  Total I/O In: 480 [P.O.:480] Out: 500 [Urine:500] Weight change:  Gen:WD WN WM in NAD CVS:no rub Resp:cta [id number] Ext:1+ edema, left leg, trace pitting edema right leg   Recent Labs Lab 10/26/13 0520 10/27/13 0430 10/28/13 0015 10/29/13 1005 10/30/13 0508 10/31/13 0504 11/01/13 0500  NA 128*  --  130* 127* 131* 134* 134*  K 4.5  --  5.1 4.6 4.8 4.1 4.0  CL 96  --  99 98 101 100 100  CO2 20  --  20 18* 18* 22 22  GLUCOSE 95  --  114* 155* 78 121* 148*  BUN 13  --  14 21 24* 27* 29*  CREATININE 1.48* 1.43* 2.43* 3.94* 4.33* 4.62* 4.89*  CALCIUM 8.2*  --  7.9* 7.5* 8.0* 8.2* 8.3*   Liver Function Tests: No results found for this basename: AST, ALT, ALKPHOS, BILITOT, PROT, ALBUMIN,  in the last 168 hours No results found for this basename: LIPASE, AMYLASE,  in the last 168 hours No results found for this basename: AMMONIA,  in the last 168 hours CBC:  Recent Labs Lab 10/27/13 0430 10/28/13 0015 10/30/13 0508 10/31/13 0838 11/01/13 0500  WBC 10.5 14.4* 11.8* 10.2 8.9  HGB 9.8* 10.2* 8.9* 9.4* 8.3*  HCT 27.6* 29.7* 25.8* 27.5* 23.9*  MCV 88.7 90.8 89.9 89.9 89.2  PLT 366 396 399 486* 463*   Cardiac Enzymes: No results found for this basename: CKTOTAL, CKMB, CKMBINDEX, TROPONINI,  in the last 168 hours CBG:  Recent Labs Lab 10/31/13 0700 10/31/13 1108 10/31/13 1611 10/31/13 2242 11/01/13 0656  GLUCAP 129* 136* 117*  155* 122*    Iron Studies: No results found for this basename: IRON, TIBC, TRANSFERRIN, FERRITIN,  in the last 72 hours Studies/Results: No results found. Marland Kitchen amLODipine  10 mg Oral Daily  . aspirin EC  81 mg Oral Daily  . docusate sodium  100 mg Oral Daily  . furosemide  80 mg Intravenous BID  . heparin subcutaneous  5,000 Units Subcutaneous Q8H  . hydrALAZINE  25 mg Oral Q8H  . insulin aspart  0-15 Units Subcutaneous TID WC  . metoprolol succinate  100 mg Oral Daily  . multivitamin with minerals  1 tablet Oral Daily  . pantoprazole  40 mg Oral Daily  . piperacillin-tazobactam (ZOSYN)  IV  2.25 g Intravenous Q8H  . sodium chloride  3 mL Intravenous Q12H    BMET    Component Value Date/Time   NA 134* 11/01/2013 0500   K 4.0 11/01/2013 0500   CL 100 11/01/2013 0500   CO2 22 11/01/2013 0500   GLUCOSE 148* 11/01/2013 0500   BUN 29* 11/01/2013 0500   CREATININE 4.89* 11/01/2013 0500   CALCIUM 8.3* 11/01/2013 0500   GFRNONAA 12* 11/01/2013 0500   GFRAA 14* 11/01/2013 0500   CBC    Component Value Date/Time  WBC 8.9 11/01/2013 0500   RBC 2.68* 11/01/2013 0500   RBC 3.32* 10/25/2013 1636   HGB 8.3* 11/01/2013 0500   HCT 23.9* 11/01/2013 0500   PLT 463* 11/01/2013 0500   MCV 89.2 11/01/2013 0500   MCH 31.0 11/01/2013 0500   MCHC 34.7 11/01/2013 0500   RDW 12.3 11/01/2013 0500   LYMPHSABS 1.4 10/21/2013 2007   MONOABS 1.2* 10/21/2013 2007   EOSABS 0.0 10/21/2013 2007   BASOSABS 0.0 10/21/2013 2007    Assessment/Plan:  1. Acute kidney injury: non-oliguric. Multiple renal insults including contrast nephropathy (+acei) and/or vanc toxicity. (pre-op Scr 0.8-0.93)  1. vanco stopped.  2. Avoid further contrast and other nephrotoxins.  3. Keep sbp over 110.  4. Adjust zosyn dose for renal failure (q8h not q6) 5. Normal complements, low Uprot, low FeNa c/w pre-renal but has marked peripheral edema. Continue to follow.  6. No urgent indication for HD but agree with holding  off on transmet amputation until he is more stable.  7. Decreased frequency of lasix to help lower the rate of diuresis to avoid further renal insult. Rate of rise has slowed. Hopefully will start to see an improvement 2. L foot ulcer / left leg fem-tib/peroneal bypass 12/16: f/b ortho and vascular  1. For transmet amputation. Edema is much better and should be stable for procedure Monday or Tuesday per Ortho 3. Volume overload: Responding to IV lasix.  4. Hyponatremia- due to #1 and edema. Improving after diuresis. 5. ABLA/Anemia- dropping Hgb, transfuse if drops below 8 and/or with surgery 6. DM, longstanding 7.   [place] A

## 2013-11-01 NOTE — Progress Notes (Signed)
[facility] PROGRESS NOTE  Alexander [name] [id number] DOB: 15-Jul-1961 DOA: 10/21/2013 PCP: No primary provider on file.  Off service summary: Patient is a 52 year old Caucasian male with history of diabetes on oral hypoglycemic agents, hypertension, and history of left foot ulcer. Patient approximately one month ago status post amputation of fifth ray but since operation his wound has not healed well and patient presented with dusky fourth toe and open and draining foul-smelling wound. Was found to have osteomyelitis. Orthopedic surgeon and vascular surgeon on board and plans are for further treatment from a surgical standpoint at [facility].  Upon transfer to [facility], the patient was seen by Orthopedic surgery and ultimately underwent partial L foot amputation on 10/27/13. Included was an intra-operative arteriogram. Post-operatively, the patient was noted to have acute renal failure, likely secondary to contrast nephropathy. He has since been continued on aggressive IVF.    Assessment/Plan: Osteomyelitis of left foot  - Orthopedic surgeon and vascular surgeon on board following - Continue broad-spectrum IV antibiotics at this point - sp L common femoral to tibial peroneal trunk bypass, intraoperative arteriogram, and amputation of L4th toe and debridement of lateral foot Orthopedics/ vascular following and cleared by renal for surgery.    DIABETES MELLITUS, TYPE II  - Continue sliding scale insulin. - Blood sugars relatively well controlled currently  HYPERTENSION  - continue on metoprolol - Hold ACEI secondary to ARF (see below) - Continue hydralazine 10 mg every 6 hours IV PRN for better BP control  - Will continue current regimen - Norvasc10 mg daily and follow. Add hydralazine 25mg  TID and titrate.  Hyponatremia  - Most likely due to volume overload. Sodium levels improving with diuresis. Continue IV diuretics. IV fluids have been discontinued.   Leukocytosis  - secondary  to left foot osteomyelitis  - continue to monitor WBC count - Continue IV antibiotics  Zosyn. Orthopedics and vascular surgery following.  Anemia  - normocytic - Has remained stable thus far - Cont to monitor  ARF - Cr up to over 4.89 with UOP improving with diuretics. - suspect contrast nephropathy and ACE inhibitor, and/or vancomycin toxicity - Continue IV Lasix. - Monitor renal fx - Hold ACEI and other nephrotoxic drugs until renal function improves Renal following and appreciate input and recommendations.  Volume overload Responding to IV diuretics. Follow.  Code Status: full Family Communication: Discussed patient, no family at bedside Disposition Plan: When medically stable.  Consultants:  Orthopaedic surgery  Vascular surgery  Procedures:  ABI  MRI of left foot  Arteriogram 10/27/13  L common femoral to tibial peroneal trunk bypass 10/27/13  Amputation of L4th toe and debridement of lateral foot 10/27/13  Antibiotics:  Vancomycin d/c'd 10/29/13   zosyn  HPI/Subjective: No new complaints. S/p surgery. Patient states some improvement with scrotal swelling and edema  Objective: Filed Vitals:   11/01/13 1324  BP: 171/68  Pulse: 77  Temp: 97.8 F (36.6 C)  Resp: 18    Intake/Output Summary (Last 24 hours) at 11/01/13 1736 Last data filed at 11/01/13 1500  Gross per 24 hour  Intake    960 ml  Output   3400 ml  Net  -2440 ml   Filed Weights   10/25/13 0658 10/26/13 0533 10/30/13 0500  Weight: 111.993 kg (246 lb 14.4 oz) 113.898 kg (251 lb 1.6 oz) 122.108 kg (269 lb 3.2 oz)    Exam:   General:  Pt in NAD, Alert and Awake  Cardiovascular: RRR, no MRG  Respiratory: CTA BL,  no wheezes  Abdomen: soft, NT, ND  Musculoskeletal: no cyanosis or clubbing. Patient has ulcer at left foot. 2-3 + BLE edema  Data Reviewed: Basic Metabolic Panel:  Recent Labs Lab 10/28/13 0015 10/29/13 1005 10/30/13 0508 10/31/13 0504 11/01/13 0500  NA  130* 127* 131* 134* 134*  K 5.1 4.6 4.8 4.1 4.0  CL 99 98 101 100 100  CO2 20 18* 18* 22 22  GLUCOSE 114* 155* 78 121* 148*  BUN 14 21 24* 27* 29*  CREATININE 2.43* 3.94* 4.33* 4.62* 4.89*  CALCIUM 7.9* 7.5* 8.0* 8.2* 8.3*   Liver Function Tests: No results found for this basename: AST, ALT, ALKPHOS, BILITOT, PROT, ALBUMIN,  in the last 168 hours No results found for this basename: LIPASE, AMYLASE,  in the last 168 hours No results found for this basename: AMMONIA,  in the last 168 hours CBC:  Recent Labs Lab 10/27/13 0430 10/28/13 0015 10/30/13 0508 10/31/13 0838 11/01/13 0500  WBC 10.5 14.4* 11.8* 10.2 8.9  HGB 9.8* 10.2* 8.9* 9.4* 8.3*  HCT 27.6* 29.7* 25.8* 27.5* 23.9*  MCV 88.7 90.8 89.9 89.9 89.2  PLT 366 396 399 486* 463*   Cardiac Enzymes: No results found for this basename: CKTOTAL, CKMB, CKMBINDEX, TROPONINI,  in the last 168 hours BNP (last 3 results) No results found for this basename: PROBNP,  in the last 8760 hours CBG:  Recent Labs Lab 10/31/13 1611 10/31/13 2242 11/01/13 0656 11/01/13 1110 11/01/13 1612  GLUCAP 117* 155* 122* 133* 116*    Recent Results (from the past 240 hour(s))  MRSA PCR SCREENING     Status: None   Collection Time    10/26/13  9:51 PM      Result Value Range Status   MRSA by PCR NEGATIVE  NEGATIVE Final   Comment:            The GeneXpert MRSA Assay (FDA     approved for NASAL specimens     only), is one component of a     comprehensive MRSA colonization     surveillance program. It is not     intended to diagnose MRSA     infection nor to guide or     monitor treatment for     MRSA infections.     Studies: No results found.  Scheduled Meds: . amLODipine  10 mg Oral Daily  . aspirin EC  81 mg Oral Daily  . docusate sodium  100 mg Oral Daily  . furosemide  80 mg Intravenous BID  . heparin subcutaneous  5,000 Units Subcutaneous Q8H  . hydrALAZINE  25 mg Oral Q8H  . insulin aspart  0-15 Units Subcutaneous TID WC   . metoprolol succinate  100 mg Oral Daily  . multivitamin with minerals  1 tablet Oral Daily  . pantoprazole  40 mg Oral Daily  . piperacillin-tazobactam (ZOSYN)  IV  2.25 g Intravenous Q8H  . sodium chloride  3 mL Intravenous Q12H   Continuous Infusions:    Principal Problem:   Osteomyelitis of left foot Active Problems:   DIABETES MELLITUS, TYPE II   HYPERTENSION   Leukocytosis   Anemia   PAD (peripheral artery disease)   Acute renal failure   Volume overload  Time spent: > 35 minutes  Edu On MD [facility] Pager [phone] If 7PM-7AM, please contact night-coverage at [email or web address], password [facility] 11/01/2013, 5:36 PM  LOS: 11 days

## 2013-11-01 NOTE — Progress Notes (Signed)
ANTIBIOTIC CONSULT NOTE - FOLLOW UP  Pharmacy Consult for Zosyn Indication: foor infection, osteo  Allergies  Allergen Reactions  . Sulfa Antibiotics Rash    Patient Measurements: Height: 6' (182.9 cm) Weight: 269 lb 3.2 oz (122.108 kg) IBW/kg (Calculated) : 77.6   Vital Signs: Temp: 98.8 F (37.1 C) (12/21 0500) Temp src: Oral (12/21 0500) BP: 168/63 mmHg (12/21 0500) Pulse Rate: 73 (12/21 0500) Intake/Output from previous day: 12/20 0701 - 12/21 0700 In: 1080 [P.O.:1080] Out: 2675 [Urine:2675] Intake/Output from this shift: Total I/O In: 480 [P.O.:480] Out: 500 [Urine:500]  Labs:  Recent Labs  10/29/13 1823 10/30/13 0508 10/31/13 0504 10/31/13 0838 11/01/13 0500  WBC  --  11.8*  --  10.2 8.9  HGB  --  8.9*  --  9.4* 8.3*  PLT  --  399  --  486* 463*  LABCREA 85.41  --   --   --   --   CREATININE  --  4.33* 4.62*  --  4.89*   Estimated Creatinine Clearance: 23.3 ml/min (by C-G formula based on Cr of 4.89). No results found for this basename: Letta Median, VANCORANDOM, [place], GENTPEAK, GENTRANDOM, TOBRATROUGH, TOBRAPEAK, TOBRARND, AMIKACINPEAK, AMIKACINTROU, AMIKACIN,  in the last 72 hours   Microbiology: Recent Results (from the past 720 hour(s))  URINE CULTURE     Status: None   Collection Time    10/22/13  3:10 PM      Result Value Range Status   Specimen Description URINE, RANDOM   Final   Special Requests NONE   Final   Culture  Setup Time     Final   Value: 10/22/2013 21:54     Performed at [place]     Final   Value: NO GROWTH     Performed at [facility]   Culture     Final   Value: NO GROWTH     Performed at [facility]   Report Status 10/23/2013 FINAL   Final  MRSA PCR SCREENING     Status: None   Collection Time    10/26/13  9:51 PM      Result Value Range Status   MRSA by PCR NEGATIVE  NEGATIVE Final   Comment:            The GeneXpert MRSA Assay (FDA     approved for  NASAL specimens     only), is one component of a     comprehensive MRSA colonization     surveillance program. It is not     intended to diagnose MRSA     infection nor to guide or     monitor treatment for     MRSA infections.    Anti-infectives   Start     Dose/Rate Route Frequency Ordered Stop   10/30/13 1800  piperacillin-tazobactam (ZOSYN) IVPB 2.25 g     2.25 g 100 mL/hr over 30 Minutes Intravenous Every 6 hours 10/30/13 1313     10/29/13 0600  vancomycin (VANCOCIN) 1,500 mg in sodium chloride 0.9 % 500 mL IVPB  Status:  Discontinued     1,500 mg 250 mL/hr over 120 Minutes Intravenous Every 24 hours 10/28/13 1119 10/29/13 1209   10/27/13 1645  cefUROXime (ZINACEF) 1.5 g in dextrose 5 % 50 mL IVPB  Status:  Discontinued     1.5 g 100 mL/hr over 30 Minutes Intravenous Every 12 hours 10/27/13 1639 10/27/13 1642   10/22/13 0500  vancomycin (  VANCOCIN) IVPB 1000 mg/200 mL premix  Status:  Discontinued     1,000 mg 200 mL/hr over 60 Minutes Intravenous Every 12 hours 10/21/13 1606 10/28/13 1119   10/22/13 0100  piperacillin-tazobactam (ZOSYN) IVPB 3.375 g  Status:  Discontinued     3.375 g 12.5 mL/hr over 240 Minutes Intravenous Every 8 hours 10/21/13 1606 10/30/13 1314   10/21/13 1615  vancomycin (VANCOCIN) 2,000 mg in sodium chloride 0.9 % 500 mL IVPB     2,000 mg 250 mL/hr over 120 Minutes Intravenous STAT 10/21/13 1606 10/21/13 2000   10/21/13 1615  piperacillin-tazobactam (ZOSYN) IVPB 3.375 g     3.375 g 100 mL/hr over 30 Minutes Intravenous STAT 10/21/13 1606 10/21/13 1728      Assessment: Patient is a 52 y.o M with foot infection s/p amputation of left 4th & 5th toe, debridement of lateral foot, and angiogram with plan for possible transmetatarsal amputation pending renal function. Patient's renal function continues to decline with Scr of 4.89. Nephrology has requested to change IV Zosyn from q6h to q8h.     Vanc 12/10 >>12/18 Zosyn 12/10 >>  12/15 MRSA - neg 12/11  Urine - neg  Plan:  - Change zosyn to 2.25gm IV q8h - Monitor renal function, WBC, cultures, and temp  Kadon Andrus A. Pincus Badder, PharmD Clinical Pharmacist - Resident Pager: [phone] Pharmacy: [phone] 11/01/2013 10:51 AM

## 2013-11-01 NOTE — Progress Notes (Signed)
Patient ID: Alexander [name], male   DOB: 06-May-1961, 52 y.o.   MRN: [id number] Comfortable. With serous drainage from left thigh to 2 edema. Left groin and foot are stable with a well-perfused foot and the remainder of the incisions looked good. Creatinine still rising up to 4.8 today. Able from vascular standpoint. Nephrology and orthopedics following regarding her renal function and foot issues

## 2013-11-01 NOTE — Progress Notes (Signed)
Subjective: 5 Days Post-Op Procedure(s) (LRB): BYPASS GRAFT LEFT FEMORAL- POSTERIOR TIBIAL ARTERY (Left) IRRIGATION AND DEBRIDEMENT EXTREMITY- LEFT FOOT (Left) AMPUTATION DIGIT-LEFT 4TH TOE, 4th and 5th metatarsal. (Left) Patient reports pain as mild.    Objective: Vital signs in last 24 hours: Temp:  [97.8 F (36.6 C)-98.8 F (37.1 C)] 98.8 F (37.1 C) (12/21 0500) Pulse Rate:  [72-83] 73 (12/21 0500) Resp:  [18-20] 20 (12/21 0500) BP: (151-186)/(56-73) 168/63 mmHg (12/21 0500) SpO2:  [98 %-100 %] 98 % (12/21 0500)  Intake/Output from previous day: 12/20 0701 - 12/21 0700 In: 1080 [P.O.:1080] Out: 2675 [Urine:2675] Intake/Output this shift: Total I/O In: 480 [P.O.:480] Out: 500 [Urine:500]   Recent Labs  10/30/13 0508 10/31/13 0838 11/01/13 0500  HGB 8.9* 9.4* 8.3*    Recent Labs  10/31/13 0838 11/01/13 0500  WBC 10.2 8.9  RBC 3.06* 2.68*  HCT 27.5* 23.9*  PLT 486* 463*    Recent Labs  10/31/13 0504 11/01/13 0500  NA 134* 134*  K 4.1 4.0  CL 100 100  CO2 22 22  BUN 27* 29*  CREATININE 4.62* 4.89*  GLUCOSE 121* 148*  CALCIUM 8.2* 8.3*   No results found for this basename: LABPT, INR,  in the last 72 hours  No change in exam  Assessment/Plan: 5 Days Post-Op Procedure(s) (LRB): BYPASS GRAFT LEFT FEMORAL- POSTERIOR TIBIAL ARTERY (Left) IRRIGATION AND DEBRIDEMENT EXTREMITY- LEFT FOOT (Left) AMPUTATION DIGIT-LEFT 4TH TOE, 4th and 5th metatarsal. (Left) PLAN     Surgery by [name]. Creatinine continues to climb per Neprology  OK for surgery Monday or Tuesday Raaga Maeder C 11/01/2013, 11:49 AM

## 2013-11-02 LAB — GLUCOSE, CAPILLARY
Glucose-Capillary: 165 mg/dL — ABNORMAL HIGH (ref 70–99)
Glucose-Capillary: 176 mg/dL — ABNORMAL HIGH (ref 70–99)

## 2013-11-02 LAB — BASIC METABOLIC PANEL
BUN: 34 mg/dL — ABNORMAL HIGH (ref 6–23)
CO2: 23 mEq/L (ref 19–32)
Calcium: 8.7 mg/dL (ref 8.4–10.5)
Creatinine, Ser: 4.65 mg/dL — ABNORMAL HIGH (ref 0.50–1.35)
GFR calc non Af Amer: 13 mL/min — ABNORMAL LOW (ref >90)
Glucose, Bld: 149 mg/dL — ABNORMAL HIGH (ref 70–99)
Sodium: 135 mEq/L (ref 135–145)

## 2013-11-02 LAB — CBC
HCT: 27.3 % — ABNORMAL LOW (ref 39.0–52.0)
Hemoglobin: 9.3 g/dL — ABNORMAL LOW (ref 13.0–17.0)
MCH: 30.7 pg (ref 26.0–34.0)
MCHC: 34.1 g/dL (ref 30.0–36.0)
MCV: 90.1 fL (ref 78.0–100.0)
RBC: 3.03 MIL/uL — ABNORMAL LOW (ref 4.22–5.81)
RDW: 12.3 % (ref 11.5–15.5)
WBC: 9.6 10*3/uL (ref 4.0–10.5)

## 2013-11-02 MED ORDER — FUROSEMIDE 10 MG/ML IJ SOLN
40.0000 mg | Freq: Two times a day (BID) | INTRAMUSCULAR | Status: DC
  Administered 2013-11-02 – 2013-11-03 (×2): 40 mg via INTRAVENOUS
  Filled 2013-11-02 (×2): qty 4

## 2013-11-02 MED ORDER — HYDRALAZINE HCL 50 MG PO TABS
50.0000 mg | ORAL_TABLET | Freq: Three times a day (TID) | ORAL | Status: DC
  Administered 2013-11-02 – 2013-11-03 (×3): 50 mg via ORAL
  Filled 2013-11-02 (×6): qty 1

## 2013-11-02 MED ORDER — HYDRALAZINE HCL 25 MG PO TABS
25.0000 mg | ORAL_TABLET | Freq: Once | ORAL | Status: AC
  Administered 2013-11-02: 25 mg via ORAL
  Filled 2013-11-02: qty 1

## 2013-11-02 MED ORDER — METOPROLOL SUCCINATE ER 50 MG PO TB24
150.0000 mg | ORAL_TABLET | Freq: Every day | ORAL | Status: DC
  Administered 2013-11-02 – 2013-11-09 (×8): 150 mg via ORAL
  Filled 2013-11-02 (×8): qty 1

## 2013-11-02 NOTE — Progress Notes (Signed)
Patient ID: Alexander [name], male   DOB: 1961-07-16, 52 y.o.   MRN: [id number] S:feels good.  I spoke with Dr. Berenice Primas this AM and we agree that giving Alexander [name] renal function [name] little bit more time to recover would benefit him.  Alexander [name] is OK to have surgery done on Wednesday.  Creatinine trended down for the first time today, he is nonoliguric   O:BP 184/65  Pulse 76  Temp(Src) 98 F (36.7 C) (Oral)  Resp 20  Ht 6' (1.829 m)  Wt 112.5 kg (248 lb 0.3 oz)  BMI 33.63 kg/m2  SpO2 99%  Intake/Output Summary (Last 24 hours) at 11/02/13 1422 Last data filed at 11/02/13 0900  Gross per 24 hour  Intake   1280 ml  Output   2420 ml  Net  -1140 ml   Intake/Output: I/O last 3 completed shifts: In: 2000 [P.O.:1560; I.V.:440] Out: 5470 [Urine:5470]  Intake/Output this shift:  Total I/O In: 240 [P.O.:240] Out: -  Weight change:  Gen:WD WN WM in NAD CVS:no rub Resp:cta [id number] Ext:1+ edema, left leg, trace pitting edema right leg   Recent Labs Lab 10/27/13 0430 10/28/13 0015 10/29/13 1005 10/30/13 0508 10/31/13 0504 11/01/13 0500 11/02/13 0705  NA  --  130* 127* 131* 134* 134* 135  K  --  5.1 4.6 4.8 4.1 4.0 4.3  CL  --  99 98 101 100 100 99  CO2  --  20 18* 18* 22 22 23   GLUCOSE  --  114* 155* 78 121* 148* 149*  BUN  --  14 21 24* 27* 29* 34*  CREATININE 1.43* 2.43* 3.94* 4.33* 4.62* 4.89* 4.65*  CALCIUM  --  7.9* 7.5* 8.0* 8.2* 8.3* 8.7   Liver Function Tests: No results found for this basename: AST, ALT, ALKPHOS, BILITOT, PROT, ALBUMIN,  in the last 168 hours No results found for this basename: LIPASE, AMYLASE,  in the last 168 hours No results found for this basename: AMMONIA,  in the last 168 hours CBC:  Recent Labs Lab 10/28/13 0015 10/30/13 0508 10/31/13 0838 11/01/13 0500 11/02/13 0925  WBC 14.4* 11.8* 10.2 8.9 9.6  HGB 10.2* 8.9* 9.4* 8.3* 9.3*  HCT 29.7* 25.8* 27.5* 23.9* 27.3*  MCV 90.8 89.9 89.9 89.2 90.1  PLT 396 399 486* 463* 485*    Cardiac Enzymes: No results found for this basename: CKTOTAL, CKMB, CKMBINDEX, TROPONINI,  in the last 168 hours CBG:  Recent Labs Lab 11/01/13 1110 11/01/13 1612 11/01/13 2134 11/02/13 0641 11/02/13 1111  GLUCAP 133* 116* 155* 167* 145*    Iron Studies: No results found for this basename: IRON, TIBC, TRANSFERRIN, FERRITIN,  in the last 72 hours Studies/Results: No results found. Marland Kitchen amLODipine  10 mg Oral Daily  . aspirin EC  81 mg Oral Daily  . docusate sodium  100 mg Oral Daily  . furosemide  80 mg Intravenous BID  . heparin subcutaneous  5,000 Units Subcutaneous Q8H  . hydrALAZINE  50 mg Oral Q8H  . insulin aspart  0-15 Units Subcutaneous TID WC  . metoprolol succinate  150 mg Oral Daily  . multivitamin with minerals  1 tablet Oral Daily  . pantoprazole  40 mg Oral Daily  . piperacillin-tazobactam (ZOSYN)  IV  2.25 g Intravenous Q8H  . sodium chloride  3 mL Intravenous Q12H    BMET    Component Value Date/Time   NA 135 11/02/2013 0705   K 4.3 11/02/2013 0705   CL 99 11/02/2013 0705  CO2 23 11/02/2013 0705   GLUCOSE 149* 11/02/2013 0705   BUN 34* 11/02/2013 0705   CREATININE 4.65* 11/02/2013 0705   CALCIUM 8.7 11/02/2013 0705   GFRNONAA 13* 11/02/2013 0705   GFRAA 15* 11/02/2013 0705   CBC    Component Value Date/Time   WBC 9.6 11/02/2013 0925   RBC 3.03* 11/02/2013 0925   RBC 3.32* 10/25/2013 1636   HGB 9.3* 11/02/2013 0925   HCT 27.3* 11/02/2013 0925   PLT 485* 11/02/2013 0925   MCV 90.1 11/02/2013 0925   MCH 30.7 11/02/2013 0925   MCHC 34.1 11/02/2013 0925   RDW 12.3 11/02/2013 0925   LYMPHSABS 1.4 10/21/2013 2007   MONOABS 1.2* 10/21/2013 2007   EOSABS 0.0 10/21/2013 2007   BASOSABS 0.0 10/21/2013 2007    Assessment/Plan:  1. Acute kidney injury: non-oliguric. Multiple renal insults including contrast nephropathy (+acei) and/or vanc toxicity. (pre-op Scr 0.8-0.93)  1. vanco stopped.  2. Avoid further contrast and other nephrotoxins.   3. Keep sbp over 110.  4. Adjust zosyn dose for renal failure (q8h not q6) 5. Normal complements, low Uprot, low FeNa c/w pre-renal but has marked peripheral edema. Continue to follow.  6. No urgent indication for HD and finally creatinine is trending down.  i think Wednesday is [name] good timeline for definitive surgery, will check daily labs.   2. Will decrease lasix further as i feel he will autodiurese 3. L foot ulcer / left leg fem-tib/peroneal bypass 12/16: f/b ortho and vascular  1. For transmet amputation on 12/24. Edema is much better and should be stable for procedure then per Ortho 4. Volume overload: Responding to IV lasix.  5. Hyponatremia- due to #1 and edema. Improved after diuresis. 6. ABLA/Anemia- dropping Hgb, I was going to transfuse for [name] hgb in the 8's- but now in the 9's.  Will reeval in AM 7. DM, longstanding 8.   Alexander [name]

## 2013-11-02 NOTE — Progress Notes (Addendum)
[facility] on OR for foot surgery.  He wants to go home as soon as possible.  Objective 183/62 78 98 F (36.7 C) (Oral) 20 99%  Intake/Output Summary (Last 24 hours) at 11/02/13 0852 Last data filed at 11/02/13 [id number]  Gross per 24 hour  Intake   1520 ml  Output   3170 ml  Net  -1650 ml    Right thigh incision clear drainage, no erythema. Dry dressing placed over incision. Distally skin is warm to touch well perfused.  Assessment/Planning: . Left common femoral to tibial peroneal trunk bypass with non-reversed translocated saphenous vein graft  2. Intraoperative arteriogram  3. Ray amputation of the left fourth toe and debridement of lateral foot  3 Days Post-Op. Pending further foot debridement per Dr. Berenice Primas.  4.  Nephrology monitoring Cr 5.Left thigh incision drainage. Patient is on antibiotics IV Zosyn. No fever or chills.   Laurence Slate [facility] 11/02/2013 8:52 AM  Agree with above. Bypass graft patent. Some serous drainage from proximal thigh incision. Creatinine equal to 4.89 yesterday. Hopefully this will start to trend down soon. Or so plans further work on the left foot.  [name], MD, [place] [phone] 11/02/2013

## 2013-11-02 NOTE — Progress Notes (Signed)
[facility] PROGRESS NOTE  Alexander [name] [id number] DOB: 1961/06/06 DOA: 10/21/2013 PCP: No primary provider on file.  Off service summary: Patient is a 52 year old Caucasian male with history of diabetes on oral hypoglycemic agents, hypertension, and history of left foot ulcer. Patient approximately one month ago status post amputation of fifth ray but since operation his wound has not healed well and patient presented with dusky fourth toe and open and draining foul-smelling wound. Was found to have osteomyelitis. Orthopedic surgeon and vascular surgeon on board and plans are for further treatment from a surgical standpoint at [facility].  Upon transfer to [facility], the patient was seen by Orthopedic surgery and ultimately underwent partial L foot amputation on 10/27/13. Included was an intra-operative arteriogram. Post-operatively, the patient was noted to have acute renal failure, likely secondary to contrast nephropathy. He has since been continued on aggressive IVF.    Assessment/Plan: Osteomyelitis of left foot  - Orthopedic surgeon and vascular surgeon on board following - Continue broad-spectrum IV antibiotics at this point - sp L common femoral to tibial peroneal trunk bypass, intraoperative arteriogram, and amputation of L4th toe and debridement of lateral foot Orthopedics/ vascular following and cleared by renal for surgery yesterday. Per vascular and ortho.    DIABETES MELLITUS, TYPE II  - Continue sliding scale insulin. - Blood sugars relatively well controlled currently  HYPERTENSION  - Increase metoprolol XL to 150mg  daily - Hold ACEI secondary to ARF (see below) - Continue hydralazine 10 mg every 6 hours IV PRN for better BP control  - Will continue current regimen - Norvasc10 mg daily and follow. Increase hydralazine to 50mg  TID and titrate.  Hyponatremia  - Most likely due to volume overload. Sodium levels improving with diuresis. Continue IV diuretics. IV  fluids have been discontinued.   Leukocytosis  - secondary to left foot osteomyelitis  - WBC count trending down. - Continue IV antibiotics  Zosyn. Orthopedics and vascular surgery following.  Anemia  - normocytic - Has remained stable thus far - Cont to monitor  ARF - Cr up to 4.65 today from 4.89 with UOP improving with diuretics.Likely renal function has plateaued and hopefully will begin to start trending down. - suspect contrast nephropathy and ACE inhibitor, and/or vancomycin toxicity - Continue IV Lasix. - Monitor renal fx - Hold ACEI and other nephrotoxic drugs until renal function improves Renal following and appreciate input and recommendations.  Volume overload Responding to IV diuretics. Follow.  Code Status: full Family Communication: Discussed patient, no family at bedside Disposition Plan: When medically stable.  Consultants:  Orthopaedic surgery  Vascular surgery  Procedures:  ABI  MRI of left foot  Arteriogram 10/27/13  L common femoral to tibial peroneal trunk bypass 10/27/13  Amputation of L4th toe and debridement of lateral foot 10/27/13  Antibiotics:  Vancomycin d/c'd 10/29/13   zosyn  HPI/Subjective: No new complaints. S/p surgery. Patient states some improvement with edema. Patient anxious to have procedure done in order to be dischraged before christmas.  Objective: Filed Vitals:   11/02/13 0946  BP: 184/65  Pulse: 76  Temp:   Resp:     Intake/Output Summary (Last 24 hours) at 11/02/13 1102 Last data filed at 11/02/13 0900  Gross per 24 hour  Intake   1760 ml  Output   3170 ml  Net  -1410 ml   Filed Weights   10/26/13 0533 10/30/13 0500 11/02/13 0513  Weight: 113.898 kg (251 lb 1.6 oz) 122.108 kg (269 lb 3.2 oz) 112.5  kg (248 lb 0.3 oz)    Exam:   General:  Pt in NAD, Alert and Awake  Cardiovascular: RRR, no MRG  Respiratory: CTA BL, no wheezes  Abdomen: soft, NT, ND  Musculoskeletal: no cyanosis or  clubbing. Patient has ulcer at left foot bandaged. 2 + BLE edema  Data Reviewed: Basic Metabolic Panel:  Recent Labs Lab 10/29/13 1005 10/30/13 0508 10/31/13 0504 11/01/13 0500 11/02/13 0705  NA 127* 131* 134* 134* 135  K 4.6 4.8 4.1 4.0 4.3  CL 98 101 100 100 99  CO2 18* 18* 22 22 23   GLUCOSE 155* 78 121* 148* 149*  BUN 21 24* 27* 29* 34*  CREATININE 3.94* 4.33* 4.62* 4.89* 4.65*  CALCIUM 7.5* 8.0* 8.2* 8.3* 8.7   Liver Function Tests: No results found for this basename: AST, ALT, ALKPHOS, BILITOT, PROT, ALBUMIN,  in the last 168 hours No results found for this basename: LIPASE, AMYLASE,  in the last 168 hours No results found for this basename: AMMONIA,  in the last 168 hours CBC:  Recent Labs Lab 10/28/13 0015 10/30/13 0508 10/31/13 0838 11/01/13 0500 11/02/13 0925  WBC 14.4* 11.8* 10.2 8.9 9.6  HGB 10.2* 8.9* 9.4* 8.3* 9.3*  HCT 29.7* 25.8* 27.5* 23.9* 27.3*  MCV 90.8 89.9 89.9 89.2 90.1  PLT 396 399 486* 463* 485*   Cardiac Enzymes: No results found for this basename: CKTOTAL, CKMB, CKMBINDEX, TROPONINI,  in the last 168 hours BNP (last 3 results) No results found for this basename: PROBNP,  in the last 8760 hours CBG:  Recent Labs Lab 11/01/13 0656 11/01/13 1110 11/01/13 1612 11/01/13 2134 11/02/13 0641  GLUCAP 122* 133* 116* 155* 167*    Recent Results (from the past 240 hour(s))  MRSA PCR SCREENING     Status: None   Collection Time    10/26/13  9:51 PM      Result Value Range Status   MRSA by PCR NEGATIVE  NEGATIVE Final   Comment:            The GeneXpert MRSA Assay (FDA     approved for NASAL specimens     only), is one component of a     comprehensive MRSA colonization     surveillance program. It is not     intended to diagnose MRSA     infection nor to guide or     monitor treatment for     MRSA infections.     Studies: No results found.  Scheduled Meds: . amLODipine  10 mg Oral Daily  . aspirin EC  81 mg Oral Daily  .  docusate sodium  100 mg Oral Daily  . furosemide  80 mg Intravenous BID  . heparin subcutaneous  5,000 Units Subcutaneous Q8H  . hydrALAZINE  50 mg Oral Q8H  . insulin aspart  0-15 Units Subcutaneous TID WC  . metoprolol succinate  150 mg Oral Daily  . multivitamin with minerals  1 tablet Oral Daily  . pantoprazole  40 mg Oral Daily  . piperacillin-tazobactam (ZOSYN)  IV  2.25 g Intravenous Q8H  . sodium chloride  3 mL Intravenous Q12H   Continuous Infusions:    Principal Problem:   Osteomyelitis of left foot Active Problems:   DIABETES MELLITUS, TYPE II   HYPERTENSION   Leukocytosis   Anemia   PAD (peripheral artery disease)   Acute renal failure   Volume overload  Time spent: > 35 minutes  [facility] MD [facility] Pager [phone]  If 7PM-7AM, please contact night-coverage at [email or web address], password [facility] 11/02/2013, 11:02 AM  LOS: 12 days

## 2013-11-02 NOTE — Progress Notes (Signed)
Subjective: Pt improving.  Anxious to get foot amputqated and get out of hospital.   Objective: Vital signs in last 24 hours: Temp:  [97.8 F (36.6 C)-98.4 F (36.9 C)] 98 F (36.7 C) (12/22 0513) Pulse Rate:  [76-78] 78 (12/22 0513) Resp:  [18-20] 20 (12/22 0513) BP: (165-178)/(62-68) 178/62 mmHg (12/22 0513) SpO2:  [98 %-99 %] 99 % (12/22 0513) Weight:  [112.5 kg (248 lb 0.3 oz)] 112.5 kg (248 lb 0.3 oz) (12/22 0513)  Intake/Output from previous day: 12/21 0701 - 12/22 0700 In: 2000 [P.O.:1560; I.V.:440] Out: 3670 [Urine:3670] Intake/Output this shift:     Recent Labs  10/31/13 0838 11/01/13 0500  HGB 9.4* 8.3*    Recent Labs  10/31/13 0838 11/01/13 0500  WBC 10.2 8.9  RBC 3.06* 2.68*  HCT 27.5* 23.9*  PLT 486* 463*    Recent Labs  10/31/13 0504 11/01/13 0500  NA 134* 134*  K 4.1 4.0  CL 100 100  CO2 22 22  BUN 27* 29*  CREATININE 4.62* 4.89*  GLUCOSE 121* 148*  CALCIUM 8.2* 8.3*   No results found for this basename: LABPT, INR,  in the last 72 hours  Neurologically intact ABD soft swelling improved currently  Assessment/Plan: Persistent wound l foot.  Need to clarify with Renal service when amputation prudent.  Will discuss case with [name] as he may be getting involved.Confusion centers around renal note yesterday suggesting surgery OK for MON/TUE buit on call renal doc suggesting creatinine should normalize.  Will check labs today when available and [name] or I will proceed with amputation whae proceed with amputation when appropriate.   Anisten Tomassi L 11/02/2013, 7:55 AM

## 2013-11-02 NOTE — Progress Notes (Signed)
Physical Therapy Treatment Patient Details Name: Alexander [name] MRN: [id number] DOB: Nov 23, 1960 Today's Date: 11/02/2013 Time: [id number] PT Time Calculation (min): 16 min  PT Assessment / Plan / Recommendation  History of Present Illness s/p fem-pop bypass, 2* PVD, L 4th toe amputation and 5th metatarsal mid-shaft amputation.   PT Comments   Pt agreeable to participate in therapy.  States he has been getting up & walking without RW in room by himself.  Encouraged pt to wear darco shoe while ambulating although has not been wearing it.  Pt does require cueing to reinforce "heel only" while walking.     Follow Up Recommendations  No PT follow up;Supervision/Assistance - 24 hour     Does the patient have the potential to tolerate intense rehabilitation     Barriers to Discharge        Equipment Recommendations  3in1 (PT)    Recommendations for Other Services    Frequency Min 3X/week   Progress towards PT Goals Progress towards PT goals: Progressing toward goals  Plan Current plan remains appropriate    Precautions / Restrictions Precautions Precautions: Fall Restrictions LLE Weight Bearing: Partial weight bearing LLE Partial Weight Bearing Percentage or Pounds: heel only       Mobility  Bed Mobility Bed Mobility: Supine to Sit Supine to Sit: 7: Independent Transfers Transfers: Sit to Stand;Stand to Sit Sit to Stand: 6: Modified independent (Device/Increase time);With upper extremity assist;From bed Stand to Sit: 6: Modified independent (Device/Increase time);With upper extremity assist;Other (comment) (to couch by window per pt's request) Ambulation/Gait Ambulation/Gait Assistance: 4: Min guard Ambulation Distance (Feet): 60 Feet Assistive device: Other (Comment) (IV pole) Ambulation/Gait Assistance Details: Pt states he hasn't been using RW over weekend, just pushing IV pole.  Mildly unsteady due to darco shoe but no LOB.  Cues to reinforce heel only & no "toe off"  during stance phase.    Gait Pattern: Step-to pattern General Gait Details: cues to not perform step through gait sequence to prevent WBing through toes of Lt foot.   Stairs: No Wheelchair Mobility Wheelchair Mobility: No      PT Goals (current goals can now be found in the care plan section) Acute Rehab PT Goals PT Goal Formulation: With patient Time For Goal Achievement: 11/04/13 Potential to Achieve Goals: Good  Visit Information  Last PT Received On: 11/02/13 Assistance Needed: +1 History of Present Illness: s/p fem-pop bypass, 2* PVD, L 4th toe amputation and 5th metatarsal mid-shaft amputation.    Subjective Data      Cognition  Cognition Arousal/Alertness: Awake/alert Behavior During Therapy: WFL for tasks assessed/performed Overall Cognitive Status: Within Functional Limits for tasks assessed    Balance     End of Session PT - End of Session Equipment Utilized During Treatment: Other (comment) (darco shoe) Activity Tolerance: Patient tolerated treatment well Patient left: Other (comment) (couch by window) Nurse Communication: Mobility status   GP     Sena Hitch 11/02/2013, 8:23 AM   Sarajane Marek, PTA [phone] 11/02/2013

## 2013-11-03 LAB — BASIC METABOLIC PANEL
Calcium: 8.5 mg/dL (ref 8.4–10.5)
GFR calc Af Amer: 16 mL/min — ABNORMAL LOW (ref >90)
GFR calc non Af Amer: 14 mL/min — ABNORMAL LOW (ref >90)
Glucose, Bld: 165 mg/dL — ABNORMAL HIGH (ref 70–99)
Potassium: 4.9 mEq/L (ref 3.5–5.1)
Sodium: 135 mEq/L (ref 135–145)

## 2013-11-03 LAB — CBC
MCH: 28.1 pg (ref 26.0–34.0)
MCV: 89 fL (ref 78.0–100.0)
Platelets: 457 10*3/uL — ABNORMAL HIGH (ref 150–400)
RBC: 2.92 MIL/uL — ABNORMAL LOW (ref 4.22–5.81)
RDW: 12.4 % (ref 11.5–15.5)

## 2013-11-03 LAB — GLUCOSE, CAPILLARY
Glucose-Capillary: 199 mg/dL — ABNORMAL HIGH (ref 70–99)
Glucose-Capillary: 182 mg/dL — ABNORMAL HIGH (ref 70–99)

## 2013-11-03 MED ORDER — HYDRALAZINE HCL 50 MG PO TABS
50.0000 mg | ORAL_TABLET | Freq: Once | ORAL | Status: AC
  Administered 2013-11-03: 50 mg via ORAL
  Filled 2013-11-03: qty 1

## 2013-11-03 MED ORDER — HYDRALAZINE HCL 50 MG PO TABS
100.0000 mg | ORAL_TABLET | Freq: Three times a day (TID) | ORAL | Status: DC
  Administered 2013-11-03 – 2013-11-09 (×16): 100 mg via ORAL
  Filled 2013-11-03 (×21): qty 2

## 2013-11-03 MED ORDER — FUROSEMIDE 10 MG/ML IJ SOLN
120.0000 mg | Freq: Two times a day (BID) | INTRAMUSCULAR | Status: DC
  Administered 2013-11-03 – 2013-11-06 (×6): 120 mg via INTRAVENOUS
  Filled 2013-11-03 (×7): qty 12

## 2013-11-03 NOTE — Progress Notes (Signed)
[facility] PROGRESS NOTE  Alexander [name] [id number] DOB: Nov 01, 1961 DOA: 10/21/2013 PCP: No primary provider on file.  Off service summary: Patient is a 52 year old Caucasian male with history of diabetes on oral hypoglycemic agents, hypertension, and history of left foot ulcer. Patient approximately one month ago status post amputation of fifth ray but since operation his wound has not healed well and patient presented with dusky fourth toe and open and draining foul-smelling wound. Was found to have osteomyelitis. Orthopedic surgeon and vascular surgeon on board and plans are for further treatment from a surgical standpoint at [facility].  Upon transfer to [facility], the patient was seen by Orthopedic surgery and ultimately underwent partial L foot amputation on 10/27/13. Included was an intra-operative arteriogram. Post-operatively, the patient was noted to have acute renal failure, likely secondary to contrast nephropathy. He has since been continued on aggressive IVF.    Assessment/Plan: Osteomyelitis of left foot  - Orthopedic surgeon and vascular surgeon on board following - Continue broad-spectrum IV antibiotics at this point - sp L common femoral to tibial peroneal trunk bypass, intraoperative arteriogram, and amputation of L4th toe and debridement of lateral foot Orthopedics/ vascular following and cleared by renal for surgery hopefully tomorrow. Per vascular and ortho.    DIABETES MELLITUS, TYPE II  - Continue sliding scale insulin. - Blood sugars relatively well controlled currently  HYPERTENSION  - Increased metoprolol XL to 150mg  daily - Hold ACEI secondary to ARF (see below) - Continue hydralazine 10 mg every 6 hours IV PRN for better BP control  - Will continue current regimen - Norvasc10 mg daily and follow. Increase hydralazine to 100mg  TID and titrate.  Hyponatremia  - Most likely due to volume overload. Sodium levels improving with diuresis. Continue IV  diuretics. IV fluids have been discontinued.   Leukocytosis  - secondary to left foot osteomyelitis  - WBC count trending down. - Continue IV antibiotics  Zosyn. Orthopedics and vascular surgery following.  Anemia  - normocytic - Has remained stable thus far - Cont to monitor  ARF - Cr started to trend back down and currently at 4.33 from 4.65 from 4.89 with UOP improving with diuretics.Likely renal function has plateaued and starting trending down. - suspect contrast nephropathy and ACE inhibitor, and/or vancomycin toxicity - Continue IV Lasix. - Monitor renal fx - Hold ACEI and other nephrotoxic drugs until renal function improves Renal following and appreciate input and recommendations.  Volume overload Responding to IV diuretics. Follow.  Code Status: full Family Communication: Discussed patient, no family at bedside Disposition Plan: When medically stable.  Consultants:  Orthopaedic surgery  Vascular surgery  nephrology  Procedures:  ABI  MRI of left foot  Arteriogram 10/27/13  L common femoral to tibial peroneal trunk bypass 10/27/13  Amputation of L4th toe and debridement of lateral foot 10/27/13  Antibiotics:  Vancomycin d/c'd 10/29/13   zosyn  HPI/Subjective: No new complaints. S/p surgery. Patient states some improvement with edema. Patient anxious to have procedure done in order to be dischraged before christmas.no complaints  Objective: Filed Vitals:   11/03/13 1602  BP: 160/58  Pulse: 73  Temp: 97.9 F (36.6 C)  Resp: 18    Intake/Output Summary (Last 24 hours) at 11/03/13 1707 Last data filed at 11/03/13 1500  Gross per 24 hour  Intake   1860 ml  Output   1600 ml  Net    260 ml   Filed Weights   10/30/13 0500 11/02/13 0513 11/03/13 0649  Weight: 122.108  kg (269 lb 3.2 oz) 112.5 kg (248 lb 0.3 oz) 113.1 kg (249 lb 5.4 oz)    Exam:   General:  Pt in NAD, Alert and Awake  Cardiovascular: RRR, no MRG  Respiratory: CTA BL,  no wheezes  Abdomen: soft, NT, ND  Musculoskeletal: no cyanosis or clubbing. Patient has ulcer at left foot bandaged. 1-2 + BLE edema  Data Reviewed: Basic Metabolic Panel:  Recent Labs Lab 10/30/13 0508 10/31/13 0504 11/01/13 0500 11/02/13 0705 11/03/13 0635  NA 131* 134* 134* 135 135  K 4.8 4.1 4.0 4.3 4.9  CL 101 100 100 99 99  CO2 18* 22 22 23 20   GLUCOSE 78 121* 148* 149* 165*  BUN 24* 27* 29* 34* 35*  CREATININE 4.33* 4.62* 4.89* 4.65* 4.33*  CALCIUM 8.0* 8.2* 8.3* 8.7 8.5   Liver Function Tests: No results found for this basename: AST, ALT, ALKPHOS, BILITOT, PROT, ALBUMIN,  in the last 168 hours No results found for this basename: LIPASE, AMYLASE,  in the last 168 hours No results found for this basename: AMMONIA,  in the last 168 hours CBC:  Recent Labs Lab 10/30/13 0508 10/31/13 0838 11/01/13 0500 11/02/13 0925 11/03/13 0635  WBC 11.8* 10.2 8.9 9.6 11.0*  HGB 8.9* 9.4* 8.3* 9.3* 8.2*  HCT 25.8* 27.5* 23.9* 27.3* 26.0*  MCV 89.9 89.9 89.2 90.1 89.0  PLT 399 486* 463* 485* 457*   Cardiac Enzymes: No results found for this basename: CKTOTAL, CKMB, CKMBINDEX, TROPONINI,  in the last 168 hours BNP (last 3 results) No results found for this basename: PROBNP,  in the last 8760 hours CBG:  Recent Labs Lab 11/02/13 1625 11/02/13 2145 11/03/13 0628 11/03/13 1103 11/03/13 1640  GLUCAP 165* 176* 163* 141* 199*    Recent Results (from the past 240 hour(s))  MRSA PCR SCREENING     Status: None   Collection Time    10/26/13  9:51 PM      Result Value Range Status   MRSA by PCR NEGATIVE  NEGATIVE Final   Comment:            The GeneXpert MRSA Assay (FDA     approved for NASAL specimens     only), is one component of a     comprehensive MRSA colonization     surveillance program. It is not     intended to diagnose MRSA     infection nor to guide or     monitor treatment for     MRSA infections.     Studies: No results found.  Scheduled Meds: .  amLODipine  10 mg Oral Daily  . aspirin EC  81 mg Oral Daily  . docusate sodium  100 mg Oral Daily  . furosemide  120 mg Intravenous BID  . heparin subcutaneous  5,000 Units Subcutaneous Q8H  . hydrALAZINE  100 mg Oral Q8H  . insulin aspart  0-15 Units Subcutaneous TID WC  . metoprolol succinate  150 mg Oral Daily  . multivitamin with minerals  1 tablet Oral Daily  . pantoprazole  40 mg Oral Daily  . piperacillin-tazobactam (ZOSYN)  IV  2.25 g Intravenous Q8H  . sodium chloride  3 mL Intravenous Q12H   Continuous Infusions:    Principal Problem:   Osteomyelitis of left foot Active Problems:   DIABETES MELLITUS, TYPE II   HYPERTENSION   Leukocytosis   Anemia   PAD (peripheral artery disease)   Acute renal failure   Volume overload  Time  spent: > 35 minutes  Sandro Burgo MD [facility] Pager [phone] If 7PM-7AM, please contact night-coverage at [email or web address], password [facility] 11/03/2013, 5:07 PM  LOS: 13 days

## 2013-11-03 NOTE — Progress Notes (Signed)
Patient ID: Alexander [name], male   DOB: [birth year], 52 y.o.   MRN: [id number] S:feels good.    Creatinine trended down again, he is nonoliguric.  I had backed down on his lasix, but i think needs more   O:BP 172/72  Pulse 75  Temp(Src) 98.5 F (36.9 C) (Oral)  Resp 19  Ht 6' (1.829 m)  Wt 113.1 kg (249 lb 5.4 oz)  BMI 33.81 kg/m2  SpO2 98%  Intake/Output Summary (Last 24 hours) at 11/03/13 1440 Last data filed at 11/03/13 0700  Gross per 24 hour  Intake   1380 ml  Output   1600 ml  Net   -220 ml   Intake/Output: I/O last 3 completed shifts: In: 1940 [P.O.:1320; I.V.:320; IV Piggyback:300] Out: 2750 [Urine:2750]  Intake/Output this shift:    Weight change: 0.6 kg (1 lb 5.2 oz) Gen:WD WN WM in NAD CVS:no rub Resp:cta [id number] Ext:2+ edema, left leg, 1 + edema right leg   Recent Labs Lab 10/28/13 0015 10/29/13 1005 10/30/13 0508 10/31/13 0504 11/01/13 0500 11/02/13 0705 11/03/13 0635  NA 130* 127* 131* 134* 134* 135 135  K 5.1 4.6 4.8 4.1 4.0 4.3 4.9  CL 99 98 101 100 100 99 99  CO2 20 18* 18* 22 22 23 20   GLUCOSE 114* 155* 78 121* 148* 149* 165*  BUN 14 21 24* 27* 29* 34* 35*  CREATININE 2.43* 3.94* 4.33* 4.62* 4.89* 4.65* 4.33*  CALCIUM 7.9* 7.5* 8.0* 8.2* 8.3* 8.7 8.5   Liver Function Tests: No results found for this basename: AST, ALT, ALKPHOS, BILITOT, PROT, ALBUMIN,  in the last 168 hours No results found for this basename: LIPASE, AMYLASE,  in the last 168 hours No results found for this basename: AMMONIA,  in the last 168 hours CBC:  Recent Labs Lab 10/30/13 0508 10/31/13 0838 11/01/13 0500 11/02/13 0925 11/03/13 0635  WBC 11.8* 10.2 8.9 9.6 11.0*  HGB 8.9* 9.4* 8.3* 9.3* 8.2*  HCT 25.8* 27.5* 23.9* 27.3* 26.0*  MCV 89.9 89.9 89.2 90.1 89.0  PLT 399 486* 463* 485* 457*   Cardiac Enzymes: No results found for this basename: CKTOTAL, CKMB, CKMBINDEX, TROPONINI,  in the last 168 hours CBG:  Recent Labs Lab 11/02/13 1111 11/02/13 1625  11/02/13 2145 11/03/13 0628 11/03/13 1103  GLUCAP 145* 165* 176* 163* 141*    Iron Studies: No results found for this basename: IRON, TIBC, TRANSFERRIN, FERRITIN,  in the last 72 hours Studies/Results: No results found. Marland Kitchen amLODipine  10 mg Oral Daily  . aspirin EC  81 mg Oral Daily  . docusate sodium  100 mg Oral Daily  . furosemide  40 mg Intravenous BID  . heparin subcutaneous  5,000 Units Subcutaneous Q8H  . hydrALAZINE  100 mg Oral Q8H  . insulin aspart  0-15 Units Subcutaneous TID WC  . metoprolol succinate  150 mg Oral Daily  . multivitamin with minerals  1 tablet Oral Daily  . pantoprazole  40 mg Oral Daily  . piperacillin-tazobactam (ZOSYN)  IV  2.25 g Intravenous Q8H  . sodium chloride  3 mL Intravenous Q12H    BMET    Component Value Date/Time   NA 135 11/03/2013 0635   K 4.9 11/03/2013 0635   CL 99 11/03/2013 0635   CO2 20 11/03/2013 0635   GLUCOSE 165* 11/03/2013 0635   BUN 35* 11/03/2013 0635   CREATININE 4.33* 11/03/2013 0635   CALCIUM 8.5 11/03/2013 0635   GFRNONAA 14* 11/03/2013 0635   GFRAA 16*  11/03/2013 0635   CBC    Component Value Date/Time   WBC 11.0* 11/03/2013 0635   RBC 2.92* 11/03/2013 0635   RBC 3.32* 10/25/2013 1636   HGB 8.2* 11/03/2013 0635   HCT 26.0* 11/03/2013 0635   PLT 457* 11/03/2013 0635   MCV 89.0 11/03/2013 0635   MCH 28.1 11/03/2013 0635   MCHC 31.5 11/03/2013 0635   RDW 12.4 11/03/2013 0635   LYMPHSABS 1.4 10/21/2013 2007   MONOABS 1.2* 10/21/2013 2007   EOSABS 0.0 10/21/2013 2007   BASOSABS 0.0 10/21/2013 2007    Assessment/Plan:  1. Acute kidney injury: non-oliguric. Multiple renal insults including contrast nephropathy (+acei) and/or vanc toxicity. (pre-op Scr 0.8-0.93)  1. vanco stopped.  2. Avoid further contrast and other nephrotoxins.  3. Keep sbp over 110.  4. Adjust zosyn dose for renal failure (q8h not q6) 5. Normal complements, low Uprot, low FeNa c/w pre-renal but has marked peripheral edema. Continue  to follow.  6. No urgent indication for HD and finally creatinine is finally trending down.  i think Wednesday is a good timeline for definitive surgery, will check daily labs.   2. Will increase lasix back up to assist with wound healing 3. L foot ulcer / left leg fem-tib/peroneal bypass 12/16: f/b ortho and vascular  1. For transmet amputation on 12/24. Edema is much better- will increase lasix to assist further and should be stable for procedure then per Ortho 4. Volume overload: Responding to IV lasix.  5. Hyponatremia- due to #1 and edema. Improved after diuresis. 6. ABLA/Anemia- dropping Hgb, I was going to transfuse for a hgb in the 8's- but was in the 9's yest.  Back in 8s today, will give one unit 7. DM, longstanding 8.   Megan Presti A

## 2013-11-03 NOTE — Progress Notes (Signed)
   VASCULAR PROGRESS NOTE  SUBJECTIVE: No specific complaints.   PHYSICAL EXAM: Filed Vitals:   11/02/13 1510 11/02/13 2135 11/03/13 0517 11/03/13 0649  BP: 166/70 166/58 172/72   Pulse:  73 75   Temp:  98.6 F (37 C) 98.5 F (36.9 C)   TempSrc:  Oral Oral   Resp:  19 19   Height:      Weight:    249 lb 5.4 oz (113.1 kg)  SpO2:  99% 98%    Palpable graft pulse. Moderate left leg swelling. Incisions look fine. Decrease drainage from thigh incision.  LABS: Lab Results  Component Value Date   WBC 11.0* 11/03/2013   HGB 8.2* 11/03/2013   HCT 26.0* 11/03/2013   MCV 89.0 11/03/2013   PLT 457* 11/03/2013   Lab Results  Component Value Date   CREATININE 4.33* 11/03/2013   CBG (last 3)   Recent Labs  11/02/13 1625 11/02/13 2145 11/03/13 0628  GLUCAP 165* 176* 163*   Principal Problem:   Osteomyelitis of left foot Active Problems:   DIABETES MELLITUS, TYPE II   HYPERTENSION   Leukocytosis   Anemia   PAD (peripheral artery disease)   Acute renal failure   Volume overload  ASSESSMENT AND PLAN:  * 7 Days Post-Op s/p: Left fem TPT bypass with vein. Graft patent with good graft pulse. Biphasic doppler signals on the left with ABI of 66 % post op  * Crt improved today.   * for TMA tomorrow.   Gae Gallop [id number] 11/03/2013

## 2013-11-03 NOTE — Progress Notes (Signed)
Physical Therapy Treatment Patient Details Name: Alexander [name] MRN: [id number] DOB: 1961/03/27 Today's Date: 11/03/2013 Time: 0922-0940 PT Time Calculation (min): 18 min  PT Assessment / Plan / Recommendation  History of Present Illness s/p fem-pop bypass, 2* PVD, L 4th toe amputation and 5th metatarsal mid-shaft amputation.   PT Comments   Per chart review pt is returning for surgery tomorrow.  Unsure if pt's WBing status will remain the same or if he will be NWBing after surgery.  Practiced ambulating as NWBing to prepare in case of status change.  Pt did well.  Pt does have 2 steps to get into house but he is declining practicing steps despite encouragement.     Follow Up Recommendations  No PT follow up;Supervision/Assistance - 24 hour     Does the patient have the potential to tolerate intense rehabilitation     Barriers to Discharge        Equipment Recommendations  3in1 (PT)    Recommendations for Other Services    Frequency Min 3X/week   Progress towards PT Goals Progress towards PT goals: Progressing toward goals  Plan Current plan remains appropriate    Precautions / Restrictions Precautions Precautions: Fall Restrictions LLE Weight Bearing: Partial weight bearing LLE Partial Weight Bearing Percentage or Pounds: heel only       Mobility  Bed Mobility Bed Mobility: Not assessed Transfers Transfers: Sit to Stand;Stand to Sit Sit to Stand: 6: Modified independent (Device/Increase time);With upper extremity assist;Other (comment) (couch) Stand to Sit: 6: Modified independent (Device/Increase time);With upper extremity assist;Other (comment) (couch) Ambulation/Gait Ambulation/Gait Assistance: 5: Supervision Ambulation Distance (Feet): 25 Feet Assistive device: Rolling walker Ambulation/Gait Assistance Details: Practiced ambulating as NWBing in case WBing restriction changed after surgery.  Pt able to maintain compliance with NWBing.   Gait Pattern: Step-to  pattern Stairs: No (Pt declining despite encouragement) Wheelchair Mobility Wheelchair Mobility: No      PT Goals (current goals can now be found in the care plan section) Acute Rehab PT Goals PT Goal Formulation: With patient Time For Goal Achievement: 11/04/13 Potential to Achieve Goals: Good  Visit Information  Last PT Received On: 11/03/13 Assistance Needed: +1 History of Present Illness: s/p fem-pop bypass, 2* PVD, L 4th toe amputation and 5th metatarsal mid-shaft amputation.    Subjective Data      Cognition  Cognition Arousal/Alertness: Awake/alert Behavior During Therapy: WFL for tasks assessed/performed Overall Cognitive Status: Within Functional Limits for tasks assessed    Balance     End of Session PT - End of Session Activity Tolerance: Patient tolerated treatment well Patient left: Other (comment) (couch) Nurse Communication: Mobility status   GP     Sena Hitch 11/03/2013, 9:47 AM   Sarajane Marek, PTA [phone] 11/03/2013

## 2013-11-03 NOTE — Progress Notes (Signed)
Patient does not want blood transfusion unless absolutely necessary. Hemoglobin today is 8.2. Notified Dr. Moshe Cipro. Patient will go to surgery tomorrow and follow up with lab work tomorrow. No transfusion today. Florian Buff, RN

## 2013-11-03 NOTE — Anesthesia Preprocedure Evaluation (Addendum)
Anesthesia Evaluation  Patient identified by MRN, date of birth, ID band Patient awake    Reviewed: Allergy & Precautions, H&P , NPO status , Patient's Chart, lab work & pertinent test results, reviewed documented beta blocker date and time   History of Anesthesia Complications Negative for: history of anesthetic complications  Airway Mallampati: II  Neck ROM: Full    Dental  (+) Edentulous Upper, Poor Dentition, Chipped, Missing and Dental Advisory Given   Pulmonary Current Smoker,  breath sounds clear to auscultation        Cardiovascular hypertension, Pt. on medications + Peripheral Vascular Disease Rhythm:Regular Rate:Normal  5/14 ECHO: EF 55-60%, valves OK '12 stress test: no ischemia or scar       Neuro/Psych negative neurological ROS     GI/Hepatic negative GI ROS, Neg liver ROS,   Endo/Other  diabetes, Oral Hypoglycemic AgentsMorbid obesity  Renal/GU ARFRenal disease (K+ 4.0, creat 4.64)     Musculoskeletal   Abdominal (+) + obese,   Peds  Hematology  (+) Blood dyscrasia (Hb 9.0), anemia ,   Anesthesia Other Findings   Reproductive/Obstetrics                         Anesthesia Physical Anesthesia Plan  ASA: III  Anesthesia Plan: General   Post-op Pain Management:    Induction: Intravenous  Airway Management Planned: LMA  Additional Equipment:   Intra-op Plan:   Post-operative Plan:   Informed Consent: I have reviewed the patients History and Physical, chart, labs and discussed the procedure including the risks, benefits and alternatives for the proposed anesthesia with the patient or authorized representative who has indicated his/her understanding and acceptance.   Dental advisory given  Plan Discussed with: CRNA and Surgeon  Anesthesia Plan Comments: (Plan routine monitors, GA- LMA OK)        Anesthesia Quick Evaluation

## 2013-11-04 ENCOUNTER — Encounter (HOSPITAL_COMMUNITY): Payer: Self-pay | Admitting: Certified Registered"

## 2013-11-04 ENCOUNTER — Encounter (HOSPITAL_COMMUNITY)
Admission: EM | Disposition: A | Discharge status: Home / Self Care | Payer: Self-pay | Source: Home / Self Care | Attending: Internal Medicine

## 2013-11-04 ENCOUNTER — Encounter (HOSPITAL_COMMUNITY): Payer: Medicaid Other | Admitting: Anesthesiology

## 2013-11-04 ENCOUNTER — Inpatient Hospital Stay (HOSPITAL_COMMUNITY): Payer: Medicaid Other | Admitting: Anesthesiology

## 2013-11-04 DIAGNOSIS — F1010 Alcohol abuse, uncomplicated: Secondary | ICD-10-CM

## 2013-11-04 HISTORY — PX: AMPUTATION: SHX166

## 2013-11-04 LAB — CBC
HCT: 26.3 % — ABNORMAL LOW (ref 39.0–52.0)
MCV: 89.8 fL (ref 78.0–100.0)
RBC: 2.93 MIL/uL — ABNORMAL LOW (ref 4.22–5.81)
WBC: 11.6 10*3/uL — ABNORMAL HIGH (ref 4.0–10.5)

## 2013-11-04 LAB — BASIC METABOLIC PANEL
BUN: 37 mg/dL — ABNORMAL HIGH (ref 6–23)
CO2: 24 mEq/L (ref 19–32)
Chloride: 99 mEq/L (ref 96–112)
GFR calc non Af Amer: 13 mL/min — ABNORMAL LOW (ref >90)
Potassium: 4 mEq/L (ref 3.5–5.1)
Sodium: 136 mEq/L (ref 135–145)

## 2013-11-04 LAB — GLUCOSE, CAPILLARY: Glucose-Capillary: 135 mg/dL — ABNORMAL HIGH (ref 70–99)

## 2013-11-04 LAB — SURGICAL PCR SCREEN
MRSA, PCR: NEGATIVE
Staphylococcus aureus: NEGATIVE

## 2013-11-04 SURGERY — AMPUTATION, FOOT, RAY
Anesthesia: General | Site: Toe | Laterality: Left

## 2013-11-04 MED ORDER — LACTATED RINGERS IV SOLN
INTRAVENOUS | Status: DC
  Administered 2013-11-04: 11:00:00 via INTRAVENOUS

## 2013-11-04 MED ORDER — LIDOCAINE HCL (CARDIAC) 20 MG/ML IV SOLN
INTRAVENOUS | Status: DC | PRN
  Administered 2013-11-04: 40 mg via INTRAVENOUS

## 2013-11-04 MED ORDER — HYDROMORPHONE HCL PF 1 MG/ML IJ SOLN
INTRAMUSCULAR | Status: AC
  Administered 2013-11-04: 0.5 mg via INTRAVENOUS
  Filled 2013-11-04: qty 1

## 2013-11-04 MED ORDER — OXYCODONE-ACETAMINOPHEN 5-325 MG PO TABS
ORAL_TABLET | ORAL | Status: AC
  Administered 2013-11-04: 2 via ORAL
  Filled 2013-11-04: qty 2

## 2013-11-04 MED ORDER — OXYCODONE HCL 5 MG PO TABS: 5.0000 mg | ORAL_TABLET | Freq: Once | ORAL | Status: DC | PRN

## 2013-11-04 MED ORDER — FENTANYL CITRATE 0.05 MG/ML IJ SOLN
INTRAMUSCULAR | Status: DC | PRN
  Administered 2013-11-04: 100 ug via INTRAVENOUS
  Administered 2013-11-04: 50 ug via INTRAVENOUS
  Administered 2013-11-04: 100 ug via INTRAVENOUS

## 2013-11-04 MED ORDER — PROMETHAZINE HCL 25 MG/ML IJ SOLN: 6.2500 mg | INTRAMUSCULAR | Status: DC | PRN

## 2013-11-04 MED ORDER — MIDAZOLAM HCL 2 MG/2ML IJ SOLN
0.5000 mg | Freq: Once | INTRAMUSCULAR | Status: AC | PRN
  Administered 2013-11-04: 2 mg via INTRAVENOUS

## 2013-11-04 MED ORDER — PROPOFOL 10 MG/ML IV BOLUS
INTRAVENOUS | Status: DC | PRN
  Administered 2013-11-04: 50 mg via INTRAVENOUS
  Administered 2013-11-04: 150 mg via INTRAVENOUS

## 2013-11-04 MED ORDER — HEPARIN SODIUM (PORCINE) 5000 UNIT/ML IJ SOLN
5000.0000 [IU] | Freq: Three times a day (TID) | INTRAMUSCULAR | Status: DC
  Administered 2013-11-05 – 2013-11-09 (×13): 5000 [IU] via SUBCUTANEOUS
  Filled 2013-11-04 (×16): qty 1

## 2013-11-04 MED ORDER — OXYCODONE HCL 5 MG/5ML PO SOLN: 5.0000 mg | Freq: Once | ORAL | Status: DC | PRN

## 2013-11-04 MED ORDER — MIDAZOLAM HCL 5 MG/5ML IJ SOLN
INTRAMUSCULAR | Status: DC | PRN
  Administered 2013-11-04: 2 mg via INTRAVENOUS

## 2013-11-04 MED ORDER — EPHEDRINE SULFATE 50 MG/ML IJ SOLN
INTRAMUSCULAR | Status: DC | PRN
  Administered 2013-11-04 (×2): 5 mg via INTRAVENOUS

## 2013-11-04 MED ORDER — MIDAZOLAM HCL 2 MG/2ML IJ SOLN
INTRAMUSCULAR | Status: AC
  Administered 2013-11-04: 2 mg via INTRAVENOUS
  Filled 2013-11-04: qty 2

## 2013-11-04 MED ORDER — ONDANSETRON HCL 4 MG/2ML IJ SOLN
INTRAMUSCULAR | Status: DC | PRN
  Administered 2013-11-04: 4 mg via INTRAVENOUS

## 2013-11-04 MED ORDER — ARTIFICIAL TEARS OP OINT
TOPICAL_OINTMENT | OPHTHALMIC | Status: DC | PRN
  Administered 2013-11-04: 1 via OPHTHALMIC

## 2013-11-04 MED ORDER — HYDROMORPHONE HCL PF 1 MG/ML IJ SOLN
0.2500 mg | INTRAMUSCULAR | Status: DC | PRN
  Administered 2013-11-04 (×2): 0.5 mg via INTRAVENOUS

## 2013-11-04 MED ORDER — SODIUM CHLORIDE 0.9 % IR SOLN
Status: DC | PRN
  Administered 2013-11-04 (×2): 1000 mL

## 2013-11-04 MED ORDER — MEPERIDINE HCL 25 MG/ML IJ SOLN: 6.2500 mg | INTRAMUSCULAR | Status: DC | PRN

## 2013-11-04 MED ORDER — 0.9 % SODIUM CHLORIDE (POUR BTL) OPTIME
TOPICAL | Status: DC | PRN
  Administered 2013-11-04: 1000 mL

## 2013-11-04 MED ORDER — LACTATED RINGERS IV SOLN
INTRAVENOUS | Status: DC | PRN
  Administered 2013-11-04: 12:00:00 via INTRAVENOUS

## 2013-11-04 SURGICAL SUPPLY — 56 items
BANDAGE CONFORM 2  STR LF (GAUZE/BANDAGES/DRESSINGS) IMPLANT
BANDAGE ELASTIC 3 VELCRO ST LF (GAUZE/BANDAGES/DRESSINGS) IMPLANT
BANDAGE ELASTIC 4 VELCRO ST LF (GAUZE/BANDAGES/DRESSINGS) ×2 IMPLANT
BANDAGE ELASTIC 6 VELCRO ST LF (GAUZE/BANDAGES/DRESSINGS) ×2 IMPLANT
BANDAGE GAUZE ELAST BULKY 4 IN (GAUZE/BANDAGES/DRESSINGS) ×12 IMPLANT
BLADE OSCILLATING /SAGITTAL (BLADE) ×2 IMPLANT
BNDG COHESIVE 1X5 TAN STRL LF (GAUZE/BANDAGES/DRESSINGS) IMPLANT
BNDG ESMARK 4X9 LF (GAUZE/BANDAGES/DRESSINGS) ×2 IMPLANT
CLOTH BEACON ORANGE TIMEOUT ST (SAFETY) ×2 IMPLANT
COVER SURGICAL LIGHT HANDLE (MISCELLANEOUS) ×2 IMPLANT
CUFF TOURNIQUET SINGLE 18IN (TOURNIQUET CUFF) IMPLANT
CUFF TOURNIQUET SINGLE 24IN (TOURNIQUET CUFF) IMPLANT
CUFF TOURNIQUET SINGLE 34IN LL (TOURNIQUET CUFF) IMPLANT
CUFF TOURNIQUET SINGLE 44IN (TOURNIQUET CUFF) IMPLANT
DRAIN PENROSE 1/4X12 LTX STRL (WOUND CARE) ×2 IMPLANT
DRAPE U-SHAPE 47X51 STRL (DRAPES) ×2 IMPLANT
DRSG ADAPTIC 3X8 NADH LF (GAUZE/BANDAGES/DRESSINGS) ×4 IMPLANT
FACESHIELD LNG OPTICON STERILE (SAFETY) ×2 IMPLANT
GAUZE SPONGE 2X2 8PLY STRL LF (GAUZE/BANDAGES/DRESSINGS) IMPLANT
GLOVE BIO SURGEON STRL SZ 6.5 (GLOVE) ×4 IMPLANT
GLOVE BIOGEL PI IND STRL 6.5 (GLOVE) ×1 IMPLANT
GLOVE BIOGEL PI IND STRL 8 (GLOVE) ×2 IMPLANT
GLOVE BIOGEL PI INDICATOR 6.5 (GLOVE) ×1
GLOVE BIOGEL PI INDICATOR 8 (GLOVE) ×2
GLOVE ECLIPSE 7.5 STRL STRAW (GLOVE) ×4 IMPLANT
GLOVE SURG SS PI 6.5 STRL IVOR (GLOVE) ×2 IMPLANT
GOWN PREVENTION PLUS XLARGE (GOWN DISPOSABLE) ×2 IMPLANT
GOWN SRG XL XLNG 56XLVL 4 (GOWN DISPOSABLE) ×1 IMPLANT
GOWN STRL NON-REIN LRG LVL3 (GOWN DISPOSABLE) ×8 IMPLANT
GOWN STRL NON-REIN XL XLG LVL4 (GOWN DISPOSABLE) ×1
HANDPIECE INTERPULSE COAX TIP (DISPOSABLE) ×1
KIT BASIN OR (CUSTOM PROCEDURE TRAY) ×2 IMPLANT
KIT ROOM TURNOVER OR (KITS) ×2 IMPLANT
MANIFOLD NEPTUNE II (INSTRUMENTS) ×2 IMPLANT
NS IRRIG 1000ML POUR BTL (IV SOLUTION) ×2 IMPLANT
PACK ORTHO EXTREMITY (CUSTOM PROCEDURE TRAY) ×2 IMPLANT
PAD ABD 8X10 STRL (GAUZE/BANDAGES/DRESSINGS) ×4 IMPLANT
PAD ARMBOARD 7.5X6 YLW CONV (MISCELLANEOUS) ×4 IMPLANT
PAD CAST 4YDX4 CTTN HI CHSV (CAST SUPPLIES) IMPLANT
PADDING CAST COTTON 4X4 STRL (CAST SUPPLIES)
SET HNDPC FAN SPRY TIP SCT (DISPOSABLE) ×1 IMPLANT
SOLUTION BETADINE 4OZ (MISCELLANEOUS) ×2 IMPLANT
SPECIMEN JAR SMALL (MISCELLANEOUS) ×2 IMPLANT
SPONGE GAUZE 2X2 STER 10/PKG (GAUZE/BANDAGES/DRESSINGS)
SPONGE GAUZE 4X4 12PLY (GAUZE/BANDAGES/DRESSINGS) ×2 IMPLANT
SPONGE LAP 18X18 X RAY DECT (DISPOSABLE) ×2 IMPLANT
SPONGE SCRUB IODOPHOR (GAUZE/BANDAGES/DRESSINGS) ×2 IMPLANT
STAPLER VISISTAT (STAPLE) ×2 IMPLANT
SUCTION FRAZIER TIP 10 FR DISP (SUCTIONS) ×2 IMPLANT
SUT ETHILON 2 0 PSLX (SUTURE) ×8 IMPLANT
SUT ETHILON 4 0 PS 2 18 (SUTURE) ×2 IMPLANT
TOWEL OR 17X24 6PK STRL BLUE (TOWEL DISPOSABLE) ×2 IMPLANT
TOWEL OR 17X26 10 PK STRL BLUE (TOWEL DISPOSABLE) ×2 IMPLANT
TUBE CONNECTING 12X1/4 (SUCTIONS) IMPLANT
UNDERPAD 30X30 INCONTINENT (UNDERPADS AND DIAPERS) ×2 IMPLANT
WATER STERILE IRR 1000ML POUR (IV SOLUTION) ×2 IMPLANT

## 2013-11-04 NOTE — Progress Notes (Signed)
Subjective: Ready for surgery   Objective: Vital signs in last 24 hours: Temp:  [97.9 F (36.6 C)-98.5 F (36.9 C)] 97.9 F (36.6 C) (12/24 1044) Pulse Rate:  [71-74] 73 (12/24 1044) Resp:  [18] 18 (12/24 1044) BP: (142-171)/(56-59) 171/59 mmHg (12/24 1044) SpO2:  [98 %-100 %] 98 % (12/24 1044)  Intake/Output from previous day: 12/23 0701 - 12/24 0700 In: 940 [P.O.:840; IV Piggyback:100] Out: 1000 [Urine:1000] Intake/Output this shift:     Recent Labs  11/02/13 0925 11/03/13 0635 11/04/13 0432  HGB 9.3* 8.2* 9.0*    Recent Labs  11/03/13 0635 11/04/13 0432  WBC 11.0* 11.6*  RBC 2.92* 2.93*  HCT 26.0* 26.3*  PLT 457* 475*    Recent Labs  11/03/13 0635 11/04/13 0432  NA 135 136  K 4.9 4.0  CL 99 99  CO2 20 24  BUN 35* 37*  CREATININE 4.33* 4.64*  GLUCOSE 165* 144*  CALCIUM 8.5 8.6   No results found for this basename: LABPT, INR,  in the last 72 hours  Neurologically intact ABD soft Intact pulses distally No cellulitis present Compartment soft  Assessment/Plan: Necrotic foot s/p revascularization// transmetatarsal amputation   Alexander [name] L 11/04/2013, 12:48 PM

## 2013-11-04 NOTE — Progress Notes (Signed)
Physical Therapy Treatment Patient Details Name: Alexander [name] MRN: [id number] DOB: 01-26-1961 Today's Date: 11/04/2013 Time: 0950-1001 PT Time Calculation (min): 11 min  PT Assessment / Plan / Recommendation  History of Present Illness s/p fem-pop bypass, 2* PVD, L 4th toe amputation and 5th metatarsal mid-shaft amputation.   PT Comments   Pt once again declining stair education/practice however pt's 2 daughters present & pt requesting to educate them on how to do the steps.  Educated daughters on stairs & handout provided.     Follow Up Recommendations  No PT follow up;Supervision/Assistance - 24 hour     Does the patient have the potential to tolerate intense rehabilitation     Barriers to Discharge        Equipment Recommendations  3in1 (PT)    Recommendations for Other Services    Frequency Min 3X/week   Progress towards PT Goals    Plan Current plan remains appropriate    Precautions / Restrictions Precautions Precautions: Fall Restrictions Weight Bearing Restrictions: Yes LLE Weight Bearing: Partial weight bearing LLE Partial Weight Bearing Percentage or Pounds: heel only         PT Goals (current goals can now be found in the care plan section) Acute Rehab PT Goals PT Goal Formulation: With patient Time For Goal Achievement: 11/04/13 Potential to Achieve Goals: Good  Visit Information  Last PT Received On: 11/04/13 Assistance Needed: +1 History of Present Illness: s/p fem-pop bypass, 2* PVD, L 4th toe amputation and 5th metatarsal mid-shaft amputation.    Subjective Data      Cognition  Cognition Arousal/Alertness: Awake/alert Behavior During Therapy: WFL for tasks assessed/performed Overall Cognitive Status: Within Functional Limits for tasks assessed            Sarajane Marek, [place] [phone] 11/04/2013

## 2013-11-04 NOTE — Progress Notes (Signed)
Patient ID: Alexander [name], male   DOB: 02-05-1961, 52 y.o.   MRN: [id number] S:feels good.    Creatinine trended up from yest ?why.  Currently in the or    O:BP 171/59  Pulse 73  Temp(Src) 97.9 F (36.6 C) (Oral)  Resp 18  Ht 6' (1.829 m)  Wt 113.1 kg (249 lb 5.4 oz)  BMI 33.81 kg/m2  SpO2 98%  Intake/Output Summary (Last 24 hours) at 11/04/13 1113 Last data filed at 11/04/13 0615  Gross per 24 hour  Intake    940 ml  Output   1000 ml  Net    -60 ml   Intake/Output: I/O last 3 completed shifts: In: [id number] [P.O.:1320; I.V.:120; IV Piggyback:400] Out: 1800 [Urine:1800]  Intake/Output this shift:    Weight change:  Gen:WD WN WM in NAD CVS:no rub Resp:cta [id number] Ext:2+ edema, left leg, 1 + edema right leg   Recent Labs Lab 10/29/13 1005 10/30/13 0508 10/31/13 0504 11/01/13 0500 11/02/13 0705 11/03/13 0635 11/04/13 0432  NA 127* 131* 134* 134* 135 135 136  K 4.6 4.8 4.1 4.0 4.3 4.9 4.0  CL 98 101 100 100 99 99 99  CO2 18* 18* 22 22 23 20 24   GLUCOSE 155* 78 121* 148* 149* 165* 144*  BUN 21 24* 27* 29* 34* 35* 37*  CREATININE 3.94* 4.33* 4.62* 4.89* 4.65* 4.33* 4.64*  CALCIUM 7.5* 8.0* 8.2* 8.3* 8.7 8.5 8.6   Liver Function Tests: No results found for this basename: AST, ALT, ALKPHOS, BILITOT, PROT, ALBUMIN,  in the last 168 hours No results found for this basename: LIPASE, AMYLASE,  in the last 168 hours No results found for this basename: AMMONIA,  in the last 168 hours CBC:  Recent Labs Lab 10/31/13 0838 11/01/13 0500 11/02/13 0925 11/03/13 0635 11/04/13 0432  WBC 10.2 8.9 9.6 11.0* 11.6*  HGB 9.4* 8.3* 9.3* 8.2* 9.0*  HCT 27.5* 23.9* 27.3* 26.0* 26.3*  MCV 89.9 89.2 90.1 89.0 89.8  PLT 486* 463* 485* 457* 475*   Cardiac Enzymes: No results found for this basename: CKTOTAL, CKMB, CKMBINDEX, TROPONINI,  in the last 168 hours CBG:  Recent Labs Lab 11/03/13 1103 11/03/13 1640 11/03/13 2143 11/04/13 0738 11/04/13 1041  GLUCAP 141* 199* 182*  144* 135*    Iron Studies: No results found for this basename: IRON, TIBC, TRANSFERRIN, FERRITIN,  in the last 72 hours Studies/Results: No results found. Alexander [name] HOLD] amLODipine  10 mg Oral Daily  . Alexander [name] HOLD] aspirin EC  81 mg Oral Daily  . Alexander [name] HOLD] docusate sodium  100 mg Oral Daily  . Alexander [name] HOLD] furosemide  120 mg Intravenous BID  . Alexander [name] HOLD] heparin subcutaneous  5,000 Units Subcutaneous Q8H  . [MAR HOLD] hydrALAZINE  100 mg Oral Q8H  . [MAR HOLD] insulin aspart  0-15 Units Subcutaneous TID WC  . Alexander [name] HOLD] metoprolol succinate  150 mg Oral Daily  . [MAR HOLD] multivitamin with minerals  1 tablet Oral Daily  . [MAR HOLD] pantoprazole  40 mg Oral Daily  . [MAR HOLD] piperacillin-tazobactam (ZOSYN)  IV  2.25 g Intravenous Q8H  . Alexander [name] HOLD] sodium chloride  3 mL Intravenous Q12H    BMET    Component Value Date/Time   NA 136 11/04/2013 0432   K 4.0 11/04/2013 0432   CL 99 11/04/2013 0432   CO2 24 11/04/2013 0432   GLUCOSE 144* 11/04/2013 0432   BUN 37* 11/04/2013 0432   CREATININE 4.64* 11/04/2013 0432   CALCIUM  8.6 11/04/2013 0432   GFRNONAA 13* 11/04/2013 0432   GFRAA 15* 11/04/2013 0432   CBC    Component Value Date/Time   WBC 11.6* 11/04/2013 0432   RBC 2.93* 11/04/2013 0432   RBC 3.32* 10/25/2013 1636   HGB 9.0* 11/04/2013 0432   HCT 26.3* 11/04/2013 0432   PLT 475* 11/04/2013 0432   MCV 89.8 11/04/2013 0432   MCH 30.7 11/04/2013 0432   MCHC 34.2 11/04/2013 0432   RDW 12.3 11/04/2013 0432   LYMPHSABS 1.4 10/21/2013 2007   MONOABS 1.2* 10/21/2013 2007   EOSABS 0.0 10/21/2013 2007   BASOSABS 0.0 10/21/2013 2007    Assessment/Plan:  1. Acute kidney injury: non-oliguric. Multiple renal insults including contrast nephropathy (+acei) and/or vanc toxicity. (pre-op Scr 0.8-0.93)  1. vanco stopped.  2. Avoid further contrast and other nephrotoxins.  3. Keep sbp over 110.  4. Adjust zosyn dose for renal failure (q8h not q6) 5. Normal complements, low Uprot,  low FeNa c/w pre-renal but has marked peripheral edema. Continue to follow.  6. No urgent indication for HD and finally creatinine is finally trending down.  i think today is [name] good timeline for definitive surgery, will check daily labs perioperatively.   2. Will increase lasix back up to assist with wound healing 3. L foot ulcer / left leg fem-tib/peroneal bypass 12/16: f/b ortho and vascular  1. For transmet amputation on 12/24. Edema is much better- will increase lasix to assist further and should be stable for procedure then per Ortho 4. Volume overload: Responding to IV lasix.  5. Hyponatremia- due to #1 and edema. Improved after diuresis. 6. ABLA/Anemia- dropping Hgb, I was going to transfuse but patient not comfortable with it.  Will follow post op trend. 7. DM, longstanding 8.   Alexander [name]

## 2013-11-04 NOTE — Progress Notes (Signed)
   VASCULAR PROGRESS NOTE  SUBJECTIVE: no specific complaints.  PHYSICAL EXAM: Filed Vitals:   11/03/13 1602 11/03/13 2137 11/04/13 0612 11/04/13 0615  BP: 160/58 142/57 146/56 146/56  Pulse: 73 74  71  Temp: 97.9 F (36.6 C) 98.5 F (36.9 C)  98.2 F (36.8 C)  TempSrc:  Oral  Oral  Resp: 18 18  18   Height:      Weight:      SpO2: 100% 99%  98%   Palpable graft pulse and left leg. Wounds looked good with some serous drainage from thigh wound.  LABS: Lab Results  Component Value Date   WBC 11.6* 11/04/2013   HGB 9.0* 11/04/2013   HCT 26.3* 11/04/2013   MCV 89.8 11/04/2013   PLT 475* 11/04/2013   Lab Results  Component Value Date   CREATININE 4.64* 11/04/2013   Lab Results  Component Value Date   INR 1.25 10/21/2013   CBG (last 3)   Recent Labs  11/03/13 1103 11/03/13 1640 11/03/13 2143  GLUCAP 141* 199* 182*    Principal Problem:   Osteomyelitis of left foot Active Problems:   DIABETES MELLITUS, TYPE II   HYPERTENSION   Leukocytosis   Anemia   PAD (peripheral artery disease)   Acute renal failure   Volume overload   ASSESSMENT AND PLAN:  * 8 Days Post-Op s/p: left femoral to tibial peroneal trunk bypass with vein graft.  * For transmetatarsal amputation today by orthopedics.   Gae Gallop [id number] 11/04/2013

## 2013-11-04 NOTE — Transfer of Care (Signed)
Immediate Anesthesia Transfer of Care Note  Patient: Alexander [name]  Procedure(s) Performed: Procedure(s): LEFT FOOT TRANS-METATARSAL AMPUTATION WITH WOUND CLOSURE  (Left)  Patient Location: PACU  Anesthesia Type:General  Level of Consciousness: awake, oriented and patient cooperative  Airway & Oxygen Therapy: Patient Spontanous Breathing and Patient connected to face mask oxygen  Post-op Assessment: Report given to PACU RN, Post -op Vital signs reviewed and stable and Patient moving all extremities  Post vital signs: Reviewed and stable  Complications: No apparent anesthesia complications

## 2013-11-04 NOTE — Anesthesia Procedure Notes (Signed)
Procedure Name: LMA Insertion Date/Time: 11/04/2013 1:05 PM Performed by: Sherilyn Banker Pre-anesthesia Checklist: Patient identified, Emergency Drugs available, Suction available, Patient being monitored and Timeout performed Patient Re-evaluated:Patient Re-evaluated prior to inductionOxygen Delivery Method: Circle system utilized Preoxygenation: Pre-oxygenation with 100% oxygen Intubation Type: IV induction LMA: LMA inserted and LMA flexible inserted LMA Size: 5.0 Number of attempts: 1 Placement Confirmation: positive ETCO2 and breath sounds checked- equal and bilateral Tube secured with: Tape Dental Injury: Teeth and Oropharynx as per pre-operative assessment

## 2013-11-04 NOTE — Care Management Note (Signed)
CARE MANAGEMENT NOTE 11/04/2013  Patient:  Alexander [name], Alexander [name]   Account Number:  [id number]  Date Initiated:  11/04/2013  Documentation initiated by:  Ricki Miller  Subjective/Objective Assessment:   52 yr old male admitted with gangrenous left foot.     Action/Plan:   OR today for transmetatarsal amp.  Wife states he will need assistance with HTN and diabetic meds at discharge. CM will follow.   Anticipated DC Date:     Anticipated DC Plan:  [place]  CM consult      Choice offered to / List presented to:             Status of service:  In process, will continue to follow

## 2013-11-04 NOTE — Brief Op Note (Signed)
10/21/2013 - 11/04/2013  2:04 PM  PATIENT:  Alexander [name]  52 y.o. male  PRE-OPERATIVE DIAGNOSIS:  infected foot with poor skin coverage  POST-OPERATIVE DIAGNOSIS:  infected foot   PROCEDURE:  Procedure(s): LEFT FOOT TRANS-METATARSAL AMPUTATION WITH WOUND CLOSURE  (Left)  SURGEON:  Surgeon(s) and Role:    * Alta Corning, MD - Primary  PHYSICIAN ASSISTANT:   ASSISTANTS: bethune   ANESTHESIA:   general  EBL:  Total I/O In: 300 [I.V.:300] Out: 100 [Blood:100]  BLOOD ADMINISTERED:none  DRAINS: Penrose drain in the l foot   LOCAL MEDICATIONS USED:  NONE  SPECIMEN:  Source of Specimen:  l foot  DISPOSITION OF SPECIMEN:  PATHOLOGY  COUNTS:  YES  TOURNIQUET:    DICTATION: .Other Dictation: Dictation Number [id number]  PLAN OF CARE: Admit to inpatient   PATIENT DISPOSITION:  PACU - hemodynamically stable.   Delay start of Pharmacological VTE agent (>24hrs) due to surgical blood loss or risk of bleeding: no

## 2013-11-04 NOTE — Progress Notes (Signed)
[facility] PROGRESS NOTE  KENNISON CAMPI [id number] DOB: 06/07/1961 DOA: 10/21/2013 PCP: No primary provider on file.  Off service summary: Patient is a 52 year old Caucasian male with history of diabetes on oral hypoglycemic agents, hypertension, and history of left foot ulcer. Patient approximately one month ago status post amputation of fifth ray but since operation his wound has not healed well and patient presented with dusky fourth toe and open and draining foul-smelling wound. Was found to have osteomyelitis. Orthopedic surgeon and vascular surgeon on board and plans are for further treatment from a surgical standpoint at [facility].  Upon transfer to [facility], the patient was seen by Orthopedic surgery and ultimately underwent partial L foot amputation on 10/27/13. Included was an intra-operative arteriogram. Post-operatively, the patient was noted to have acute renal failure, likely secondary to contrast nephropathy. He has since been continued on aggressive IVFinitailly which has now been d/c'd and patient now on IV diuretics. Renal function seems to have plateaued.    Assessment/Plan: Osteomyelitis of left foot  - Orthopedic surgeon and vascular surgeon on board following - Continue broad-spectrum IV antibiotics at this point - sp L common femoral to tibial peroneal trunk bypass, intraoperative arteriogram, and amputation of L4th toe and debridement of lateral foot Orthopedics/ vascular following and cleared by renal for surgery hopefully today. Patient for transmetatarsal amputation today per orthopedics. Per vascular and ortho.    DIABETES MELLITUS, TYPE II  - Continue sliding scale insulin. - Blood sugars relatively well controlled currently  HYPERTENSION  - Increased metoprolol XL to 150mg  daily - Hold ACEI secondary to ARF (see below) - Continue hydralazine 10 mg every 6 hours IV PRN for better BP control  - Will continue current regimen - Norvasc10 mg daily and  follow. Increased hydralazine to 100mg  TID.  Hyponatremia  - Most likely due to volume overload. Sodium levels improving with diuresis. Continue IV diuretics. IV fluids have been discontinued.   Leukocytosis  - secondary to left foot osteomyelitis  - WBC count trending down. - Continue IV antibiotics  Zosyn. Orthopedics and vascular surgery following.  Anemia  - normocytic - Has remained stable thus far - Cont to monitor  ARF - Cr started to trend back down and plateauing currently at 4.64 from 4.33 from 4.65 from 4.89 with UOP improving with diuretics.Likely renal function has plateaued and starting trending down. - suspect contrast nephropathy and ACE inhibitor, and/or vancomycin toxicity - Continue IV Lasix. - Monitor renal fx - Hold ACEI and other nephrotoxic drugs until renal function improves Renal following and appreciate input and recommendations.  Volume overload Responding to IV diuretics. Follow.  Code Status: full Family Communication: Discussed with patient, no family at bedside Disposition Plan: When medically stable.  Consultants:  Orthopaedic surgery  Vascular surgery  nephrology  Procedures:  ABI  MRI of left foot  Arteriogram 10/27/13  L common femoral to tibial peroneal trunk bypass 10/27/13  Amputation of L4th toe and debridement of lateral foot 10/27/13  Antibiotics:  Vancomycin d/c'd 10/29/13   zosyn  HPI/Subjective: No new complaints. S/p surgery with left femoral to tibial peroneal trunk bypass with graft. Patient states some improvement with edema. Patient denies any CP. No SOB.  Objective: Filed Vitals:   11/04/13 0615  BP: 146/56  Pulse: 71  Temp: 98.2 F (36.8 C)  Resp: 18    Intake/Output Summary (Last 24 hours) at 11/04/13 0824 Last data filed at 11/04/13 0615  Gross per 24 hour  Intake    940  ml  Output   1000 ml  Net    -60 ml   Filed Weights   10/30/13 0500 11/02/13 0513 11/03/13 0649  Weight: 122.108 kg  (269 lb 3.2 oz) 112.5 kg (248 lb 0.3 oz) 113.1 kg (249 lb 5.4 oz)    Exam:   General:  Pt in NAD, Alert and Awake  Cardiovascular: RRR, no MRG  Respiratory: CTA BL, no wheezes  Abdomen: soft, NT, ND  Musculoskeletal: no cyanosis or clubbing. Patient has ulcer at left foot bandaged. 1-2 + BLE edema  Data Reviewed: Basic Metabolic Panel:  Recent Labs Lab 10/31/13 0504 11/01/13 0500 11/02/13 0705 11/03/13 0635 11/04/13 0432  NA 134* 134* 135 135 136  K 4.1 4.0 4.3 4.9 4.0  CL 100 100 99 99 99  CO2 22 22 23 20 24   GLUCOSE 121* 148* 149* 165* 144*  BUN 27* 29* 34* 35* 37*  CREATININE 4.62* 4.89* 4.65* 4.33* 4.64*  CALCIUM 8.2* 8.3* 8.7 8.5 8.6   Liver Function Tests: No results found for this basename: AST, ALT, ALKPHOS, BILITOT, PROT, ALBUMIN,  in the last 168 hours No results found for this basename: LIPASE, AMYLASE,  in the last 168 hours No results found for this basename: AMMONIA,  in the last 168 hours CBC:  Recent Labs Lab 10/31/13 0838 11/01/13 0500 11/02/13 0925 11/03/13 0635 11/04/13 0432  WBC 10.2 8.9 9.6 11.0* 11.6*  HGB 9.4* 8.3* 9.3* 8.2* 9.0*  HCT 27.5* 23.9* 27.3* 26.0* 26.3*  MCV 89.9 89.2 90.1 89.0 89.8  PLT 486* 463* 485* 457* 475*   Cardiac Enzymes: No results found for this basename: CKTOTAL, CKMB, CKMBINDEX, TROPONINI,  in the last 168 hours BNP (last 3 results) No results found for this basename: PROBNP,  in the last 8760 hours CBG:  Recent Labs Lab 11/03/13 0628 11/03/13 1103 11/03/13 1640 11/03/13 2143 11/04/13 0738  GLUCAP 163* 141* 199* 182* 144*    Recent Results (from the past 240 hour(s))  MRSA PCR SCREENING     Status: None   Collection Time    10/26/13  9:51 PM      Result Value Range Status   MRSA by PCR NEGATIVE  NEGATIVE Final   Comment:            The GeneXpert MRSA Assay (FDA     approved for NASAL specimens     only), is one component of a     comprehensive MRSA colonization     surveillance program. It  is not     intended to diagnose MRSA     infection nor to guide or     monitor treatment for     MRSA infections.     Studies: No results found.  Scheduled Meds: . amLODipine  10 mg Oral Daily  . aspirin EC  81 mg Oral Daily  . docusate sodium  100 mg Oral Daily  . furosemide  120 mg Intravenous BID  . heparin subcutaneous  5,000 Units Subcutaneous Q8H  . hydrALAZINE  100 mg Oral Q8H  . insulin aspart  0-15 Units Subcutaneous TID WC  . metoprolol succinate  150 mg Oral Daily  . multivitamin with minerals  1 tablet Oral Daily  . pantoprazole  40 mg Oral Daily  . piperacillin-tazobactam (ZOSYN)  IV  2.25 g Intravenous Q8H  . sodium chloride  3 mL Intravenous Q12H   Continuous Infusions:    Principal Problem:   Osteomyelitis of left foot Active Problems:   DIABETES  MELLITUS, TYPE II   HYPERTENSION   Leukocytosis   Anemia   PAD (peripheral artery disease)   Acute renal failure   Volume overload  Time spent: > 35 minutes  Hamish Banks MD [facility] Pager [phone] If 7PM-7AM, please contact night-coverage at [email or web address], password [facility] 11/04/2013, 8:24 AM  LOS: 14 days

## 2013-11-04 NOTE — Preoperative (Signed)
Beta Blockers   Reason not to administer Beta Blockers:Toprol taken 11/04/13

## 2013-11-04 NOTE — Progress Notes (Signed)
Orthopedic Tech Progress Note Patient Details:  Alexander [name] [birth year] [id number]  Ortho Devices Type of Ortho Device: Postop shoe/boot Ortho Device/Splint Location: lle Ortho Device/Splint Interventions: Application   Hildred Priest 11/04/2013, 3:43 PM

## 2013-11-04 NOTE — Addendum Note (Signed)
Addendum created 11/04/13 1534 by Rogers Blocker, CRNA   Modules edited: Anesthesia Responsible Staff

## 2013-11-04 NOTE — Progress Notes (Signed)
ANTIBIOTIC CONSULT NOTE - FOLLOW UP  Pharmacy Consult for Zosyn Indication: foor infection, osteo  Allergies  Allergen Reactions  . Sulfa Antibiotics Rash    Patient Measurements: Height: 6' (182.9 cm) Weight: 249 lb 5.4 oz (113.1 kg) IBW/kg (Calculated) : 77.6   Vital Signs: Temp: 97.9 F (36.6 C) (12/24 1044) Temp src: Oral (12/24 0615) BP: 171/59 mmHg (12/24 1044) Pulse Rate: 73 (12/24 1044) Intake/Output from previous day: 12/23 0701 - 12/24 0700 In: 940 [P.O.:840; IV Piggyback:100] Out: 1000 [Urine:1000] Intake/Output from this shift:    Labs:  Recent Labs  11/02/13 0705 11/02/13 0925 11/03/13 0635 11/04/13 0432  WBC  --  9.6 11.0* 11.6*  HGB  --  9.3* 8.2* 9.0*  PLT  --  485* 457* 475*  CREATININE 4.65*  --  4.33* 4.64*   Estimated Creatinine Clearance: 23.6 ml/min (by C-G formula based on Cr of 4.64). No results found for this basename: Letta Median, VANCORANDOM, [place], GENTPEAK, GENTRANDOM, TOBRATROUGH, TOBRAPEAK, TOBRARND, AMIKACINPEAK, AMIKACINTROU, AMIKACIN,  in the last 72 hours   Microbiology: Recent Results (from the past 720 hour(s))  URINE CULTURE     Status: None   Collection Time    10/22/13  3:10 PM      Result Value Range Status   Specimen Description URINE, RANDOM   Final   Special Requests NONE   Final   Culture  Setup Time     Final   Value: 10/22/2013 21:54     Performed at [place]     Final   Value: NO GROWTH     Performed at [facility]   Culture     Final   Value: NO GROWTH     Performed at [facility]   Report Status 10/23/2013 FINAL   Final  MRSA PCR SCREENING     Status: None   Collection Time    10/26/13  9:51 PM      Result Value Range Status   MRSA by PCR NEGATIVE  NEGATIVE Final   Comment:            The GeneXpert MRSA Assay (FDA     approved for NASAL specimens     only), is one component of a     comprehensive MRSA colonization     surveillance  program. It is not     intended to diagnose MRSA     infection nor to guide or     monitor treatment for     MRSA infections.  SURGICAL PCR SCREEN     Status: None   Collection Time    11/04/13  6:20 AM      Result Value Range Status   MRSA, PCR NEGATIVE  NEGATIVE Final   Staphylococcus aureus NEGATIVE  NEGATIVE Final   Comment:            The Xpert SA Assay (FDA     approved for NASAL specimens     in patients over 54 years of age),     is one component of     a comprehensive surveillance     program.  Test performance has     been validated by [employer] for patients greater     than or equal to 27 year old.     It is not intended     to diagnose infection nor to     guide or monitor treatment.  Anti-infectives   Start     Dose/Rate Route Frequency Ordered Stop   11/01/13 1500  piperacillin-tazobactam (ZOSYN) IVPB 2.25 g     2.25 g 100 mL/hr over 30 Minutes Intravenous 3 times per day 11/01/13 1055     10/30/13 1800  piperacillin-tazobactam (ZOSYN) IVPB 2.25 g  Status:  Discontinued     2.25 g 100 mL/hr over 30 Minutes Intravenous Every 6 hours 10/30/13 1313 11/01/13 1055   10/29/13 0600  vancomycin (VANCOCIN) 1,500 mg in sodium chloride 0.9 % 500 mL IVPB  Status:  Discontinued     1,500 mg 250 mL/hr over 120 Minutes Intravenous Every 24 hours 10/28/13 1119 10/29/13 1209   10/27/13 1645  cefUROXime (ZINACEF) 1.5 g in dextrose 5 % 50 mL IVPB  Status:  Discontinued     1.5 g 100 mL/hr over 30 Minutes Intravenous Every 12 hours 10/27/13 1639 10/27/13 1642   10/22/13 0500  vancomycin (VANCOCIN) IVPB 1000 mg/200 mL premix  Status:  Discontinued     1,000 mg 200 mL/hr over 60 Minutes Intravenous Every 12 hours 10/21/13 1606 10/28/13 1119   10/22/13 0100  piperacillin-tazobactam (ZOSYN) IVPB 3.375 g  Status:  Discontinued     3.375 g 12.5 mL/hr over 240 Minutes Intravenous Every 8 hours 10/21/13 1606 10/30/13 1314   10/21/13 1615  vancomycin (VANCOCIN) 2,000 mg in  sodium chloride 0.9 % 500 mL IVPB     2,000 mg 250 mL/hr over 120 Minutes Intravenous STAT 10/21/13 1606 10/21/13 2000   10/21/13 1615  piperacillin-tazobactam (ZOSYN) IVPB 3.375 g     3.375 g 100 mL/hr over 30 Minutes Intravenous STAT 10/21/13 1606 10/21/13 1728      Assessment: Patient is a 52 y.o M with foot infection s/p amputation of left 4th & 5th toe, debridement of lateral foot, and angiogram with plan for transmetatarsal amputation today. Patients renal fxn remains poor but no need for urgent HD per renal. Nephrology has requested to change IV zosyn to 2.25gm IV Q8H.     Vanc 12/10 >>12/18 Zosyn 12/10 >>  12/15 MRSA - neg 12/11 Urine - neg  Plan:  1. Continue zosyn 2.25gm IV Q8H - MD please indicate if we can increase dose back up to 3.375gm IV Q8H (each dose infused over 4 hours) since CrCl>92ml/min 2. F/u renal fxn, C&S, clinical status and LOT  Salome Arnt, PharmD, BCPS Pager # [phone] 11/04/2013 10:55 AM

## 2013-11-04 NOTE — Anesthesia Postprocedure Evaluation (Signed)
  Anesthesia Post-op Note  Patient: Alexander [name]  Procedure(s) Performed: Procedure(s): LEFT FOOT TRANS-METATARSAL AMPUTATION WITH WOUND CLOSURE  (Left)  Patient Location: PACU  Anesthesia Type:General  Level of Consciousness: awake, alert , oriented and patient cooperative  Airway and Oxygen Therapy: Patient Spontanous Breathing  Post-op Pain: none  Post-op Assessment: Post-op Vital signs reviewed, Patient's Cardiovascular Status Stable, Respiratory Function Stable, Patent Airway, No signs of Nausea or vomiting and Pain level controlled  Post-op Vital Signs: Reviewed and stable  Complications: No apparent anesthesia complications

## 2013-11-05 LAB — CBC
HCT: 25.8 % — ABNORMAL LOW (ref 39.0–52.0)
Hemoglobin: 8.8 g/dL — ABNORMAL LOW (ref 13.0–17.0)
MCV: 90.5 fL (ref 78.0–100.0)
Platelets: 444 10*3/uL — ABNORMAL HIGH (ref 150–400)
RBC: 2.85 MIL/uL — ABNORMAL LOW (ref 4.22–5.81)
WBC: 12.9 10*3/uL — ABNORMAL HIGH (ref 4.0–10.5)

## 2013-11-05 LAB — GLUCOSE, CAPILLARY
Glucose-Capillary: 146 mg/dL — ABNORMAL HIGH (ref 70–99)
Glucose-Capillary: 175 mg/dL — ABNORMAL HIGH (ref 70–99)

## 2013-11-05 LAB — BASIC METABOLIC PANEL
CO2: 26 mEq/L (ref 19–32)
Calcium: 8.7 mg/dL (ref 8.4–10.5)
Chloride: 96 mEq/L (ref 96–112)
GFR calc Af Amer: 14 mL/min — ABNORMAL LOW (ref >90)
Sodium: 135 mEq/L (ref 135–145)

## 2013-11-05 NOTE — Progress Notes (Signed)
Subjective: Left Foot Trans-Met Amp 11-04-13  Sitting up, doing well, inquiring about going home    Objective: Vital signs in last 24 hours: Temp:  [98 F (36.7 C)-98.7 F (37.1 C)] 98 F (36.7 C) (12/25 [id number]) Pulse Rate:  [72-85] 81 (12/25 0642) Resp:  [18] 18 (12/25 0642) BP: (123-150)/(52-66) 123/66 mmHg (12/25 1324) SpO2:  [98 %-99 %] 99 % (12/25 0642) Weight:  [114.6 kg (252 lb 10.4 oz)] 114.6 kg (252 lb 10.4 oz) (12/25 0530)  Intake/Output from previous day: 12/24 0701 - 12/25 0700 In: 1220 [P.O.:720; I.V.:500] Out: 2200 [Urine:2100; Blood:100] Intake/Output this shift:     Recent Labs  11/03/13 0635 11/04/13 0432 11/05/13 0550  HGB 8.2* 9.0* 8.8*    Recent Labs  11/04/13 0432 11/05/13 0550  WBC 11.6* 12.9*  RBC 2.93* 2.85*  HCT 26.3* 25.8*  PLT 475* 444*    Recent Labs  11/04/13 0432 11/05/13 0550  NA 136 135  K 4.0 4.1  CL 99 96  CO2 24 26  BUN 37* 41*  CREATININE 4.64* 4.92*  GLUCOSE 144* 164*  CALCIUM 8.6 8.7   No results found for this basename: LABPT, INR,  in the last 72 hours  Incision: dressing C/D/I  Assessment/Plan: 1. I will plan to change dressing tomorrow afternoon, pulling drain at that time. 2. He may d/c anytime from ortho POV. 3. I informed patient that decision for d/c resides with hospitalist, with input from Nephrology. 4. If patient to d/c on Friday, please call me to let me know so that I may pull drain, inspect wound, and change dressing prior to d/c   Lameka Disla A. 11/05/2013, 7:21 PM

## 2013-11-05 NOTE — Progress Notes (Signed)
Patient ID: Alexander [name], male   DOB: 1960-12-08, 52 y.o.   MRN: [id number] S:feels good.    Creatinine trended up again  from yest ?why.  Making urine, bp is better, not too high or too low  O:BP 135/64  Pulse 81  Temp(Src) 98 F (36.7 C) (Oral)  Resp 18  Ht 6' (1.829 m)  Wt 114.6 kg (252 lb 10.4 oz)  BMI 34.26 kg/m2  SpO2 99%  Intake/Output Summary (Last 24 hours) at 11/05/13 1013 Last data filed at 11/05/13 [id number]  Gross per 24 hour  Intake   1700 ml  Output   2200 ml  Net   -500 ml   Intake/Output: I/O last 3 completed shifts: In: [id number] [P.O.:840; I.V.:500; IV Piggyback:50] Out: 3200 [Urine:3100; Blood:100]  Intake/Output this shift:  Total I/O In: 480 [P.O.:480] Out: -  Weight change:  Gen:WD WN WM in NAD CVS:no rub Resp:cta [id number] Ext:2+ edema, left leg, 1 + edema right leg   Recent Labs Lab 10/30/13 0508 10/31/13 0504 11/01/13 0500 11/02/13 0705 11/03/13 0635 11/04/13 0432 11/05/13 0550  NA 131* 134* 134* 135 135 136 135  K 4.8 4.1 4.0 4.3 4.9 4.0 4.1  CL 101 100 100 99 99 99 96  CO2 18* 22 22 23 20 24 26   GLUCOSE 78 121* 148* 149* 165* 144* 164*  BUN 24* 27* 29* 34* 35* 37* 41*  CREATININE 4.33* 4.62* 4.89* 4.65* 4.33* 4.64* 4.92*  CALCIUM 8.0* 8.2* 8.3* 8.7 8.5 8.6 8.7   Liver Function Tests: No results found for this basename: AST, ALT, ALKPHOS, BILITOT, PROT, ALBUMIN,  in the last 168 hours No results found for this basename: LIPASE, AMYLASE,  in the last 168 hours No results found for this basename: AMMONIA,  in the last 168 hours CBC:  Recent Labs Lab 11/01/13 0500 11/02/13 0925 11/03/13 0635 11/04/13 0432 11/05/13 0550  WBC 8.9 9.6 11.0* 11.6* 12.9*  HGB 8.3* 9.3* 8.2* 9.0* 8.8*  HCT 23.9* 27.3* 26.0* 26.3* 25.8*  MCV 89.2 90.1 89.0 89.8 90.5  PLT 463* 485* 457* 475* 444*   Cardiac Enzymes: No results found for this basename: CKTOTAL, CKMB, CKMBINDEX, TROPONINI,  in the last 168 hours CBG:  Recent Labs Lab 11/04/13 1041  11/04/13 1416 11/04/13 1616 11/04/13 2123 11/05/13 0630  GLUCAP 135* 132* 139* 213* 146*    Iron Studies: No results found for this basename: IRON, TIBC, TRANSFERRIN, FERRITIN,  in the last 72 hours Studies/Results: No results found. Marland Kitchen amLODipine  10 mg Oral Daily  . aspirin EC  81 mg Oral Daily  . docusate sodium  100 mg Oral Daily  . furosemide  120 mg Intravenous BID  . heparin subcutaneous  5,000 Units Subcutaneous Q8H  . hydrALAZINE  100 mg Oral Q8H  . insulin aspart  0-15 Units Subcutaneous TID WC  . metoprolol succinate  150 mg Oral Daily  . multivitamin with minerals  1 tablet Oral Daily  . pantoprazole  40 mg Oral Daily  . piperacillin-tazobactam (ZOSYN)  IV  2.25 g Intravenous Q8H  . sodium chloride  3 mL Intravenous Q12H    BMET    Component Value Date/Time   NA 135 11/05/2013 0550   K 4.1 11/05/2013 0550   CL 96 11/05/2013 0550   CO2 26 11/05/2013 0550   GLUCOSE 164* 11/05/2013 0550   BUN 41* 11/05/2013 0550   CREATININE 4.92* 11/05/2013 0550   CALCIUM 8.7 11/05/2013 0550   GFRNONAA 12* 11/05/2013 0550   GFRAA  14* 11/05/2013 0550   CBC    Component Value Date/Time   WBC 12.9* 11/05/2013 0550   RBC 2.85* 11/05/2013 0550   RBC 3.32* 10/25/2013 1636   HGB 8.8* 11/05/2013 0550   HCT 25.8* 11/05/2013 0550   PLT 444* 11/05/2013 0550   MCV 90.5 11/05/2013 0550   MCH 30.9 11/05/2013 0550   MCHC 34.1 11/05/2013 0550   RDW 12.4 11/05/2013 0550   LYMPHSABS 1.4 10/21/2013 2007   MONOABS 1.2* 10/21/2013 2007   EOSABS 0.0 10/21/2013 2007   BASOSABS 0.0 10/21/2013 2007    Assessment/Plan:  1. Acute kidney injury: non-oliguric. Multiple renal insults including contrast nephropathy (+acei) and/or vanc toxicity. (pre-op Scr 0.8-0.93)  1. vanco stopped.  2. Avoid further contrast and other nephrotoxins.  3. Keep sbp over 110.  4. Adjust zosyn dose for renal failure (q8h not q6) 5. Normal complements, low Uprot, low FeNa c/w pre-renal but has marked peripheral  edema. Continue to follow.  2. No urgent indication for HD -  creatinine is trending up but still making urine. I think just continued supportive care is indicated and wait for it to plateau again. Will continue lasix  to assist with wound healing, i dont think is dry 3. L foot ulcer / left leg fem-tib/peroneal bypass 12/16: f/b ortho and vascular  1. For transmet amputation on 12/24. Edema is much better- will increase lasix to assist further and should be stable for procedure then per Ortho 4. Volume overload: Responding to IV lasix.  5. Hyponatremia- due to #1 and edema. Improved after diuresis. 6. ABLA/Anemia- dropping Hgb, I was going to transfuse but patient not comfortable with it.  Will follow post op trend. 7. DM, longstanding 8. htn- better on multidrug reg- norvasc10, hydralazine 100tid, toprol xl 150 and lasix  Alexander [name]

## 2013-11-05 NOTE — Progress Notes (Signed)
[facility] [place]  Subjective  - Doing well over all from a vascular stand point.   Objective 135/64 81 98 F (36.7 C) (Oral) 18 99%  Intake/Output Summary (Last 24 hours) at 11/05/13 0758 Last data filed at 11/05/13 [id number]  Gross per 24 hour  Intake   1220 ml  Output   2200 ml  Net   -980 ml   Right thigh upper incision no active drainage.  Dressing with min. [place].  Clean dry dressing applied.   Assessment/Planning:  1. Left common femoral to tibial peroneal trunk bypass with non-reversed translocated saphenous vein graft  2. Intraoperative arteriogram  3. Ray amputation of the left fourth toe and debridement of lateral foot  3 Days Post-Op. Pending further foot debridement per Dr. Berenice Primas.  4. Nephrology monitoring Cr LEFT FOOT TRANS-METATARSAL AMPUTATION WITH WOUND CLOSURE (Left) Dr. Berenice Primas F/U in our office in 2-3 weeks with Dr. Scot Dock.    Laurence Slate [facility] 11/05/2013 7:58 AM --  Laboratory Lab Results:  Recent Labs  11/04/13 0432 11/05/13 0550  WBC 11.6* 12.9*  HGB 9.0* 8.8*  HCT 26.3* 25.8*  PLT 475* 444*   BMET  Recent Labs  11/04/13 0432 11/05/13 0550  NA 136 135  K 4.0 4.1  CL 99 96  CO2 24 26  GLUCOSE 144* 164*  BUN 37* 41*  CREATININE 4.64* 4.92*  CALCIUM 8.6 8.7    COAG Lab Results  Component Value Date   INR 1.25 10/21/2013   No results found for this basename: PTT

## 2013-11-05 NOTE — Progress Notes (Signed)
[facility] PROGRESS NOTE  VERDEN DONINI [id number] DOB: 01-Jul-1961 DOA: 10/21/2013 PCP: No primary provider on file.  Off service summary: Patient is a 52 year old Caucasian male with history of diabetes on oral hypoglycemic agents, hypertension, and history of left foot ulcer. Patient approximately one month ago status post amputation of fifth ray but since operation his wound has not healed well and patient presented with dusky fourth toe and open and draining foul-smelling wound. Was found to have osteomyelitis. Orthopedic surgeon and vascular surgeon on board and plans are for further treatment from a surgical standpoint at [facility].  Upon transfer to [facility], the patient was seen by Orthopedic surgery and ultimately underwent partial L foot amputation on 10/27/13. Included was an intra-operative arteriogram. Post-operatively, the patient was noted to have acute renal failure, likely secondary to contrast nephropathy. He has since been continued on aggressive IVFinitailly which has now been d/c'd and patient now on IV diuretics. Renal function seems to have plateaued.    Assessment/Plan: Osteomyelitis of left foot  - Orthopedic surgeon and vascular surgeon on board following - Continue broad-spectrum IV antibiotics at this point - sp L common femoral to tibial peroneal trunk bypass, intraoperative arteriogram, and amputation of L4th toe and debridement of lateral foot Orthopedics/ vascular following and cleared by renal for surgery hopefully today. Patient status post transmetatarsal amputation 11/04/2013 per orthopedics. Per vascular and ortho.    DIABETES MELLITUS, TYPE II  - Continue sliding scale insulin. - Blood sugars relatively well controlled currently  HYPERTENSION  - continue metoprolol XL to 150mg  daily - Hold ACEI secondary to ARF (see below) - Continue hydralazine 10 mg every 6 hours IV PRN for better BP control  - Norvasc10 mg daily and follow. continue  hydralazine to 100mg  TID.  Hyponatremia  - Most likely due to volume overload. Sodium levels improved with diuresis. Continue IV diuretics.   Leukocytosis  - secondary to left foot osteomyelitis  - WBC count trending down. - Continue IV antibiotics  Zosyn. Orthopedics and vascular surgery following.  Anemia  - normocytic - Has remained stable thus far - Cont to monitor  ARF - Cr started to trend back up today and currently at 4.92 from 4.64 from 4.33 from 4.65 from 4.89 with UOP improving with diuretics. - suspect contrast nephropathy and ACE inhibitor, and/or vancomycin toxicity - Continue IV Lasix. - Monitor renal fx - Hold ACEI and other nephrotoxic drugs until renal function improves Renal following and appreciate input and recommendations.  Volume overload Responding to IV diuretics. Follow.  Code Status: full Family Communication: Discussed with patient, no family at bedside Disposition Plan: When medically stable.  Consultants:  Orthopaedic surgery  Vascular surgery  nephrology  Procedures:  ABI  MRI of left foot  Arteriogram 10/27/13  L common femoral to tibial peroneal trunk bypass 10/27/13  Amputation of L4th toe and debridement of lateral foot 10/27/13  Status post left foot transmetatarsal amputation good wound closure 11/04/2013 per Dr. Berenice Primas  Antibiotics:  Vancomycin d/c'd 10/29/13   zosyn  HPI/Subjective: No new complaints. S/p surgery with left femoral to tibial peroneal trunk bypass with graft. Patient states some improvement with edema. Patient denies any CP. No SOB.  Objective: Filed Vitals:   11/05/13 1324  BP: 123/66  Pulse:   Temp:   Resp:     Intake/Output Summary (Last 24 hours) at 11/05/13 1348 Last data filed at 11/05/13 [id number]  Gross per 24 hour  Intake   1400 ml  Output  2100 ml  Net   -700 ml   Filed Weights   11/02/13 0513 11/03/13 0649 11/05/13 0530  Weight: 112.5 kg (248 lb 0.3 oz) 113.1 kg (249 lb 5.4 oz)  114.6 kg (252 lb 10.4 oz)    Exam:   General:  Pt in NAD, Alert and Awake  Cardiovascular: RRR, no MRG  Respiratory: CTA BL, no wheezes  Abdomen: soft, NT, ND  Musculoskeletal: no cyanosis or clubbing. Patient has ulcer at left foot bandaged. 1-2 + BLE edema  Data Reviewed: Basic Metabolic Panel:  Recent Labs Lab 11/01/13 0500 11/02/13 0705 11/03/13 0635 11/04/13 0432 11/05/13 0550  NA 134* 135 135 136 135  K 4.0 4.3 4.9 4.0 4.1  CL 100 99 99 99 96  CO2 22 23 20 24 26   GLUCOSE 148* 149* 165* 144* 164*  BUN 29* 34* 35* 37* 41*  CREATININE 4.89* 4.65* 4.33* 4.64* 4.92*  CALCIUM 8.3* 8.7 8.5 8.6 8.7   Liver Function Tests: No results found for this basename: AST, ALT, ALKPHOS, BILITOT, PROT, ALBUMIN,  in the last 168 hours No results found for this basename: LIPASE, AMYLASE,  in the last 168 hours No results found for this basename: AMMONIA,  in the last 168 hours CBC:  Recent Labs Lab 11/01/13 0500 11/02/13 0925 11/03/13 0635 11/04/13 0432 11/05/13 0550  WBC 8.9 9.6 11.0* 11.6* 12.9*  HGB 8.3* 9.3* 8.2* 9.0* 8.8*  HCT 23.9* 27.3* 26.0* 26.3* 25.8*  MCV 89.2 90.1 89.0 89.8 90.5  PLT 463* 485* 457* 475* 444*   Cardiac Enzymes: No results found for this basename: CKTOTAL, CKMB, CKMBINDEX, TROPONINI,  in the last 168 hours BNP (last 3 results) No results found for this basename: PROBNP,  in the last 8760 hours CBG:  Recent Labs Lab 11/04/13 1416 11/04/13 1616 11/04/13 2123 11/05/13 0630 11/05/13 1143  GLUCAP 132* 139* 213* 146* 175*    Recent Results (from the past 240 hour(s))  MRSA PCR SCREENING     Status: None   Collection Time    10/26/13  9:51 PM      Result Value Range Status   MRSA by PCR NEGATIVE  NEGATIVE Final   Comment:            The GeneXpert MRSA Assay (FDA     approved for NASAL specimens     only), is one component of a     comprehensive MRSA colonization     surveillance program. It is not     intended to diagnose MRSA      infection nor to guide or     monitor treatment for     MRSA infections.  SURGICAL PCR SCREEN     Status: None   Collection Time    11/04/13  6:20 AM      Result Value Range Status   MRSA, PCR NEGATIVE  NEGATIVE Final   Staphylococcus aureus NEGATIVE  NEGATIVE Final   Comment:            The Xpert SA Assay (FDA     approved for NASAL specimens     in patients over 71 years of age),     is one component of     a comprehensive surveillance     program.  Test performance has     been validated by [employer] for patients greater     than or equal to 15 year old.     It is not intended  to diagnose infection nor to     guide or monitor treatment.     Studies: No results found.  Scheduled Meds: . amLODipine  10 mg Oral Daily  . aspirin EC  81 mg Oral Daily  . docusate sodium  100 mg Oral Daily  . furosemide  120 mg Intravenous BID  . heparin subcutaneous  5,000 Units Subcutaneous Q8H  . hydrALAZINE  100 mg Oral Q8H  . insulin aspart  0-15 Units Subcutaneous TID WC  . metoprolol succinate  150 mg Oral Daily  . multivitamin with minerals  1 tablet Oral Daily  . pantoprazole  40 mg Oral Daily  . piperacillin-tazobactam (ZOSYN)  IV  2.25 g Intravenous Q8H  . sodium chloride  3 mL Intravenous Q12H   Continuous Infusions:    Principal Problem:   Osteomyelitis of left foot Active Problems:   DIABETES MELLITUS, TYPE II   HYPERTENSION   Leukocytosis   Anemia   PAD (peripheral artery disease)   Acute renal failure   Volume overload  Time spent: > 35 minutes  Yelina Sarratt MD [facility] Pager [phone] If 7PM-7AM, please contact night-coverage at [email or web address], password [facility] 11/05/2013, 1:48 PM  LOS: 15 days

## 2013-11-05 NOTE — Progress Notes (Signed)
   Daily Progress Note  Assessment/Planning: POD #9 s/p left femoral to tibial peroneal trunk bypass with vein graft, POD #1 s/p TMA   L TMA mgmt per Dr. Berenice Primas  Cont dressing to vein harvest sites  Subjective  - 1 Day Post-Op  Pain tolerable  Objective Filed Vitals:   11/04/13 2127 11/05/13 0217 11/05/13 0530 11/05/13 0642  BP: 150/55 123/52  135/64  Pulse: 85 72  81  Temp: 98.7 F (37.1 C) 98.7 F (37.1 C)  98 F (36.7 C)  TempSrc: Oral Oral  Oral  Resp: 18 18  18   Height:      Weight:   252 lb 10.4 oz (114.6 kg)   SpO2: 98% 99%  99%    Intake/Output Summary (Last 24 hours) at 11/05/13 0836 Last data filed at 11/05/13 [id number]  Gross per 24 hour  Intake   1220 ml  Output   2200 ml  Net   -980 ml   VASC  L leg incision c/d/i, per PA drainage improved from previous, L lower leg bandaged  Laboratory CBC    Component Value Date/Time   WBC 12.9* 11/05/2013 0550   HGB 8.8* 11/05/2013 0550   HCT 25.8* 11/05/2013 0550   PLT 444* 11/05/2013 0550    BMET    Component Value Date/Time   NA 135 11/05/2013 0550   K 4.1 11/05/2013 0550   CL 96 11/05/2013 0550   CO2 26 11/05/2013 0550   GLUCOSE 164* 11/05/2013 0550   BUN 41* 11/05/2013 0550   CREATININE 4.92* 11/05/2013 0550   CALCIUM 8.7 11/05/2013 0550   GFRNONAA 12* 11/05/2013 0550   GFRAA 14* 11/05/2013 0550    Adele Barthel, MD [facility] [place]: [phone] Pager: [phone]  11/05/2013, 8:36 AM

## 2013-11-06 ENCOUNTER — Telehealth: Payer: Self-pay | Admitting: Vascular Surgery

## 2013-11-06 LAB — CBC
Hemoglobin: 8.5 g/dL — ABNORMAL LOW (ref 13.0–17.0)
MCH: 31.5 pg (ref 26.0–34.0)
MCV: 89.6 fL (ref 78.0–100.0)
Platelets: 414 10*3/uL — ABNORMAL HIGH (ref 150–400)
RBC: 2.7 MIL/uL — ABNORMAL LOW (ref 4.22–5.81)
WBC: 10.2 10*3/uL (ref 4.0–10.5)

## 2013-11-06 LAB — GLUCOSE, CAPILLARY
Glucose-Capillary: 148 mg/dL — ABNORMAL HIGH (ref 70–99)
Glucose-Capillary: 243 mg/dL — ABNORMAL HIGH (ref 70–99)
Glucose-Capillary: 167 mg/dL — ABNORMAL HIGH (ref 70–99)
Glucose-Capillary: 193 mg/dL — ABNORMAL HIGH (ref 70–99)

## 2013-11-06 LAB — RENAL FUNCTION PANEL
Albumin: 2.4 g/dL — ABNORMAL LOW (ref 3.5–5.2)
BUN: 44 mg/dL — ABNORMAL HIGH (ref 6–23)
Calcium: 8.7 mg/dL (ref 8.4–10.5)
GFR calc Af Amer: 13 mL/min — ABNORMAL LOW (ref >90)
Phosphorus: 6.7 mg/dL — ABNORMAL HIGH (ref 2.3–4.6)
Potassium: 4.2 mEq/L (ref 3.5–5.1)
Sodium: 137 mEq/L (ref 135–145)

## 2013-11-06 MED ORDER — FUROSEMIDE 80 MG PO TABS
80.0000 mg | ORAL_TABLET | Freq: Two times a day (BID) | ORAL | Status: DC
  Administered 2013-11-06 – 2013-11-09 (×6): 80 mg via ORAL
  Filled 2013-11-06 (×9): qty 1

## 2013-11-06 NOTE — Telephone Encounter (Addendum)
Message copied by Gena Fray on Fri Nov 06, 2013  9:47 AM ------      Message from: [place], [place] M      Created: Thu Nov 05, 2013  8:02 AM       F/U in 2 weeks with Dr. Derry Lory -pop Dr. Berenice Primas did transmeatal amputation. ------  11/06/13: no answer and no voicemail. Mailed letter, dpm

## 2013-11-06 NOTE — Plan of Care (Signed)
Problem: Acute Rehab PT Goals(only PT should resolve) Goal: Pt Will Ambulate Outcome: Adequate for Discharge Only ambulated 25 feet with modified independence

## 2013-11-06 NOTE — Progress Notes (Signed)
[facility] PROGRESS NOTE  TAYVEON OHR [id number] DOB: [birth year] DOA: 10/21/2013 PCP: No primary provider on file.  Off service summary: Patient is a 52 year old Caucasian male with history of diabetes on oral hypoglycemic agents, hypertension, and history of left foot ulcer. Patient approximately one month ago status post amputation of fifth ray but since operation his wound has not healed well and patient presented with dusky fourth toe and open and draining foul-smelling wound. Was found to have osteomyelitis. Orthopedic surgeon and vascular surgeon on board and plans are for further treatment from a surgical standpoint at [facility].  Upon transfer to [facility], the patient was seen by Orthopedic surgery and ultimately underwent partial L foot amputation on 10/27/13. Included was an intra-operative arteriogram. Post-operatively, the patient was noted to have acute renal failure, likely secondary to contrast nephropathy. He has since been continued on aggressive IVFinitailly which has now been d/c'd and patient now on IV diuretics. Renal function seems to have plateaued.    Assessment/Plan: Osteomyelitis of left foot  - Orthopedic surgeon and vascular surgeon on board following - Continue broad-spectrum IV antibiotics at this point - sp L common femoral to tibial peroneal trunk bypass, intraoperative arteriogram, and amputation of L4th toe and debridement of lateral foot Patient status post transmetatarsal amputation 11/04/2013 per orthopedics. Per vascular and ortho.    DIABETES MELLITUS, TYPE II  - Continue sliding scale insulin. - Blood sugars relatively well controlled currently  HYPERTENSION  - continue metoprolol XL to 150mg  daily - Hold ACEI secondary to ARF (see below) - Continue hydralazine 10 mg every 6 hours IV PRN for better BP control  - Norvasc10 mg daily and follow. continue hydralazine to 100mg  TID.  Hyponatremia  - Most likely due to volume overload. Sodium  levels improved with diuresis. Continue diuretics.   Leukocytosis  - secondary to left foot osteomyelitis  - WBC count trending down. - Continue IV antibiotics  Zosyn. Orthopedics and vascular surgery following.  Anemia  - normocytic - Has remained stable thus far - Cont to monitor  ARF - Cr started to trend back up today and currently at 5.23 from 4.92 from 4.64 from 4.33 from 4.65 from 4.89 with UOP improving with diuretics. - suspect contrast nephropathy and ACE inhibitor, and/or vancomycin toxicity - Continue IV Lasix per renal. - Monitor renal fx - Hold ACEI and other nephrotoxic drugs until renal function improves Renal following and appreciate input and recommendations.  Volume overload Responding to IV diuretics. Follow.  Code Status: full Family Communication: Discussed with patient, no family at bedside Disposition Plan: When medically stable.  Consultants:  Orthopaedic surgery  Vascular surgery  nephrology  Procedures:  ABI  MRI of left foot  Arteriogram 10/27/13  L common femoral to tibial peroneal trunk bypass 10/27/13  Amputation of L4th toe and debridement of lateral foot 10/27/13  Status post left foot transmetatarsal amputation good wound closure 11/04/2013 per Dr. Berenice Primas  Antibiotics:  Vancomycin d/c'd 10/29/13   zosyn  HPI/Subjective: No new complaints. S/p surgery with left femoral to tibial peroneal trunk bypass with graft. Patient states some improvement with edema. Patient denies any CP. No SOB.  Objective: Filed Vitals:   11/06/13 0617  BP:   Pulse: 72  Temp: 98 F (36.7 C)  Resp: 18    Intake/Output Summary (Last 24 hours) at 11/06/13 1221 Last data filed at 11/05/13 2233  Gross per 24 hour  Intake    400 ml  Output      0  ml  Net    400 ml   Filed Weights   11/03/13 0649 11/05/13 0530 11/06/13 0500  Weight: 113.1 kg (249 lb 5.4 oz) 114.6 kg (252 lb 10.4 oz) 111.6 kg (246 lb 0.5 oz)    Exam:   General:  Pt in  NAD, Alert and Awake  Cardiovascular: RRR, no MRG  Respiratory: CTA BL, no wheezes  Abdomen: soft, NT, ND  Musculoskeletal: no cyanosis or clubbing. Patient has ulcer at left foot bandaged. 1-2 + BLE edema  Data Reviewed: Basic Metabolic Panel:  Recent Labs Lab 11/02/13 0705 11/03/13 0635 11/04/13 0432 11/05/13 0550 11/06/13 0509  NA 135 135 136 135 137  K 4.3 4.9 4.0 4.1 4.2  CL 99 99 99 96 97  CO2 23 20 24 26 25   GLUCOSE 149* 165* 144* 164* 139*  BUN 34* 35* 37* 41* 44*  CREATININE 4.65* 4.33* 4.64* 4.92* 5.23*  CALCIUM 8.7 8.5 8.6 8.7 8.7  PHOS  --   --   --   --  6.7*   Liver Function Tests:  Recent Labs Lab 11/06/13 0509  ALBUMIN 2.4*   No results found for this basename: LIPASE, AMYLASE,  in the last 168 hours No results found for this basename: AMMONIA,  in the last 168 hours CBC:  Recent Labs Lab 11/02/13 0925 11/03/13 0635 11/04/13 0432 11/05/13 0550 11/06/13 0509  WBC 9.6 11.0* 11.6* 12.9* 10.2  HGB 9.3* 8.2* 9.0* 8.8* 8.5*  HCT 27.3* 26.0* 26.3* 25.8* 24.2*  MCV 90.1 89.0 89.8 90.5 89.6  PLT 485* 457* 475* 444* 414*   Cardiac Enzymes: No results found for this basename: CKTOTAL, CKMB, CKMBINDEX, TROPONINI,  in the last 168 hours BNP (last 3 results) No results found for this basename: PROBNP,  in the last 8760 hours CBG:  Recent Labs Lab 11/05/13 1143 11/05/13 1623 11/05/13 2229 11/06/13 0631 11/06/13 1114  GLUCAP 175* 189* 172* 148* 243*    Recent Results (from the past 240 hour(s))  SURGICAL PCR SCREEN     Status: None   Collection Time    11/04/13  6:20 AM      Result Value Range Status   MRSA, PCR NEGATIVE  NEGATIVE Final   Staphylococcus aureus NEGATIVE  NEGATIVE Final   Comment:            The Xpert SA Assay (FDA     approved for NASAL specimens     in patients over 90 years of age),     is one component of     a comprehensive surveillance     program.  Test performance has     been validated by [employer] for  patients greater     than or equal to 71 year old.     It is not intended     to diagnose infection nor to     guide or monitor treatment.     Studies: No results found.  Scheduled Meds: . amLODipine  10 mg Oral Daily  . aspirin EC  81 mg Oral Daily  . docusate sodium  100 mg Oral Daily  . furosemide  80 mg Oral BID  . heparin subcutaneous  5,000 Units Subcutaneous Q8H  . hydrALAZINE  100 mg Oral Q8H  . insulin aspart  0-15 Units Subcutaneous TID WC  . metoprolol succinate  150 mg Oral Daily  . multivitamin with minerals  1 tablet Oral Daily  . pantoprazole  40 mg Oral Daily  .  piperacillin-tazobactam (ZOSYN)  IV  2.25 g Intravenous Q8H  . sodium chloride  3 mL Intravenous Q12H   Continuous Infusions:    Principal Problem:   Osteomyelitis of left foot Active Problems:   DIABETES MELLITUS, TYPE II   HYPERTENSION   Leukocytosis   Anemia   PAD (peripheral artery disease)   Acute renal failure   Volume overload  Time spent: > 35 minutes  Angella Montas MD [facility] Pager [phone] If 7PM-7AM, please contact night-coverage at [email or web address], password [facility] 11/06/2013, 12:21 PM  LOS: 16 days

## 2013-11-06 NOTE — Op Note (Signed)
NAME:  TOBEY, CAPELLA.:  [id number]  MEDICAL RECORD NO.:  [id number]  LOCATION:                               FACILITY:  [facility]  PHYSICIAN:  Alta Corning, M.D.   DATE OF BIRTH:  1961-10-16  DATE OF PROCEDURE:  11/04/2013 DATE OF DISCHARGE:  11/09/2013                              OPERATIVE REPORT   PREOPERATIVE DIAGNOSIS:  Contaminated infected left foot, status post amputation with failure and then revascularization.  POSTOPERATIVE DIAGNOSIS:  Contaminated infected left foot, status post amputation with failure and then revascularization.  PROCEDURE:  Transmetatarsal amputation of left.  SURGEON:  Alta Corning, M.D.  ASSISTANT:  Gary Fleet, PA-C  ANESTHESIA:  General.  BRIEF HISTORY:  Mr. Parziale is a 52 year old male with a history of having had a necrotic left little toe.  At that time, his ABIs were 0.99 on the left and one on the right.  We did a fifth ray amputation and the wound is completely broke down to a level that I felt like something was not correct with his inflow.  At that point, repeat ABIs were done in our hospital which showed a ABI on the left of 0.6.  He was evaluated by the vascular surgeons.  I did an arteriogram and found him to be a candidate for revascularization.  He was taken for that.  Unfortunately, postoperatively, he had significant renal compromise.  We were trying to wait for his renal compromise to resolve.  It started at least to peak down, it started coming down, at that point, he was cleared by Nephrology for transmetatarsal amputation because of his open wound with necrotic tissue around the third toe.  He was brought to the operating room for this procedure.  DESCRIPTION OF PROCEDURE:  The patient was brought to the operating room and after adequate anesthesia was obtained with general anesthetic, the patient was placed supine on the operating table.  Left foot was then prepped and draped in usual sterile  fashion.  Following this, an incision was made over the level of bone cuts dorsally and plantar wise. It was a long flap, subcutaneous cyst all over the bone was taken with a saw and then the remainder of the foot was removed and sent to Pathology.  At this point, the metatarsals were beveled. We went over to the fourth metatarsal, shortened it slightly, fifth metatarsal really had a take back to the joint to get that lateral skin closed.  Once that was done, a pulsatile lavage irrigator was used.  There was excellent bleeding on the flap on both sides with the reperfusion and once this was done, we closed the flap after irrigating it and then suctioning it dry.  We closed the flap over a Penrose drain with interrupted nylon sutures and staples.  Sterile compressive dressing was applied and a compressive dressing all the way up to the inferior extent of his vascular wound.  At that point, the patient was taken to the recovery, he was noted to be in satisfactory condition.  Estimated blood loss for the procedure was 150 mL.     Alta Corning, M.D.   ______________________________  Alta Corning, M.D.    Corliss Skains  D:  11/04/2013  T:  11/04/2013  Job:  [id number]

## 2013-11-06 NOTE — Progress Notes (Signed)
[name] ROUNDING NOTE   Subjective:   Interval History: still with lower extremity edema but improved with IV diuretics  Objective:  Vital signs in last 24 hours:  Temp:  [98 F (36.7 C)-98.1 F (36.7 C)] 98 F (36.7 C) (12/26 0617) Pulse Rate:  [69-72] 72 (12/26 0617) Resp:  [18] 18 (12/26 0617) BP: (115-140)/(42-70) 140/60 mmHg (12/26 0549) SpO2:  [94 %-96 %] 96 % (12/26 0617) Weight:  [111.6 kg (246 lb 0.5 oz)] 111.6 kg (246 lb 0.5 oz) (12/26 0500)  Weight change: -3 kg (-6 lb 9.8 oz) Filed Weights   11/03/13 0649 11/05/13 0530 11/06/13 0500  Weight: 113.1 kg (249 lb 5.4 oz) 114.6 kg (252 lb 10.4 oz) 111.6 kg (246 lb 0.5 oz)    Intake/Output: I/O last 3 completed shifts: In: 1360 [P.O.:1360] Out: 1400 [Urine:1400]   Intake/Output this shift:     CVS- RRR RS- CTA ABD- BS present soft non-distended EXT- 3+ edema R  Bandaged L   Basic Metabolic Panel:  Recent Labs Lab 11/02/13 0705 11/03/13 0635 11/04/13 0432 11/05/13 0550 11/06/13 0509  NA 135 135 136 135 137  K 4.3 4.9 4.0 4.1 4.2  CL 99 99 99 96 97  CO2 23 20 24 26 25   GLUCOSE 149* 165* 144* 164* 139*  BUN 34* 35* 37* 41* 44*  CREATININE 4.65* 4.33* 4.64* 4.92* 5.23*  CALCIUM 8.7 8.5 8.6 8.7 8.7  PHOS  --   --   --   --  6.7*    Liver Function Tests:  Recent Labs Lab 11/06/13 0509  ALBUMIN 2.4*   No results found for this basename: LIPASE, AMYLASE,  in the last 168 hours No results found for this basename: AMMONIA,  in the last 168 hours  CBC:  Recent Labs Lab 11/02/13 0925 11/03/13 0635 11/04/13 0432 11/05/13 0550 11/06/13 0509  WBC 9.6 11.0* 11.6* 12.9* 10.2  HGB 9.3* 8.2* 9.0* 8.8* 8.5*  HCT 27.3* 26.0* 26.3* 25.8* 24.2*  MCV 90.1 89.0 89.8 90.5 89.6  PLT 485* 457* 475* 444* 414*    Cardiac Enzymes: No results found for this basename: CKTOTAL, CKMB, CKMBINDEX, TROPONINI,  in the last 168 hours  BNP: No components found with this basename: POCBNP,    CBG:  Recent Labs Lab 11/05/13 0630 11/05/13 1143 11/05/13 1623 11/05/13 2229 11/06/13 0631  GLUCAP 146* 175* 189* 172* 148*    Microbiology: Results for orders placed during the hospital encounter of 10/21/13  URINE CULTURE     Status: None   Collection Time    10/22/13  3:10 PM      Result Value Range Status   Specimen Description URINE, RANDOM   Final   Special Requests NONE   Final   Culture  Setup Time     Final   Value: 10/22/2013 21:54     Performed at [facility]     Final   Value: NO GROWTH     Performed at [facility]   Culture     Final   Value: NO GROWTH     Performed at [facility]   Report Status 10/23/2013 FINAL   Final  MRSA PCR SCREENING     Status: None   Collection Time    10/26/13  9:51 PM      Result Value Range Status   MRSA by PCR NEGATIVE  NEGATIVE Final   Comment:  The GeneXpert MRSA Assay (FDA     approved for NASAL specimens     only), is one component of a     comprehensive MRSA colonization     surveillance program. It is not     intended to diagnose MRSA     infection nor to guide or     monitor treatment for     MRSA infections.  SURGICAL PCR SCREEN     Status: None   Collection Time    11/04/13  6:20 AM      Result Value Range Status   MRSA, PCR NEGATIVE  NEGATIVE Final   Staphylococcus aureus NEGATIVE  NEGATIVE Final   Comment:            The Xpert SA Assay (FDA     approved for NASAL specimens     in patients over 47 years of age),     is one component of     a comprehensive surveillance     program.  Test performance has     been validated by [employer] for patients greater     than or equal to 76 year old.     It is not intended     to diagnose infection nor to     guide or monitor treatment.    Coagulation Studies: No results found for this basename: LABPROT, INR,  in the last 72 hours  Urinalysis: No results found for this basename: COLORURINE,  APPERANCEUR, LABSPEC, PHURINE, GLUCOSEU, HGBUR, BILIRUBINUR, KETONESUR, PROTEINUR, UROBILINOGEN, NITRITE, LEUKOCYTESUR,  in the last 72 hours    Imaging: No results found.   Medications:     . amLODipine  10 mg Oral Daily  . aspirin EC  81 mg Oral Daily  . docusate sodium  100 mg Oral Daily  . furosemide  80 mg Oral BID  . heparin subcutaneous  5,000 Units Subcutaneous Q8H  . hydrALAZINE  100 mg Oral Q8H  . insulin aspart  0-15 Units Subcutaneous TID WC  . metoprolol succinate  150 mg Oral Daily  . multivitamin with minerals  1 tablet Oral Daily  . pantoprazole  40 mg Oral Daily  . piperacillin-tazobactam (ZOSYN)  IV  2.25 g Intravenous Q8H  . sodium chloride  3 mL Intravenous Q12H   acetaminophen, acetaminophen, bisacodyl, guaiFENesin-dextromethorphan, hydrALAZINE, HYDROcodone-acetaminophen, labetalol, metoprolol, morphine injection, ondansetron, oxyCODONE-acetaminophen, phenol, senna-docusate  Assessment/ Plan:  1.Acute kidney injury: non-oliguric. Multiple renal insults including contrast nephropathy (+acei) and/or vanc toxicity. (pre-op Scr 0.8-0.93)  1. vanco stopped.  2. Avoid further contrast and other nephrotoxins.  3. Keep sbp over 110.  4. Adjusted zosyn dose for renal failure (q8h not q6) 5. Normal complements, low Uprot, low FeNa c/w pre-renal but has marked peripheral edema. Continue to follow.  2.No urgent indication for HD - creatinine is trending up but still making urine. I think just continued supportive care is indicated and wait for it to plateau again. Will continue lasix but switch to oral lasix 3 L foot ulcer / left leg fem-tib/peroneal bypass 12/16: f/b ortho and vascular  1. Status post transmet amputation on 12/24. Edema is much better- will change to oral lasix to assist further and should be stable for procedure then per Ortho 4.Volume overload: Switch to oral lasix. 5. Hyponatremia- due to #1 and edema. Improved after diuresis.  6.ABLA/Anemia-  dropping Hgb Will follow post op trend. 7 DM, longstanding  8 htn- better on multidrug reg- norvasc10, hydralazine 100tid, toprol  xl 150 and lasix      LOS: 37 Alexander [name] W @TODAY @10 :53 AM

## 2013-11-06 NOTE — Plan of Care (Signed)
Problem: Acute Rehab PT Goals(only PT should resolve) Goal: Pt Will Go Up/Down Stairs Outcome: Not Met (add Reason) Patient deferred practicing steps.

## 2013-11-06 NOTE — Progress Notes (Signed)
ANTIBIOTIC CONSULT NOTE - FOLLOW UP  Pharmacy Consult for Zosyn Indication: Foot infection, Osteo  Allergies  Allergen Reactions  . Sulfa Antibiotics Rash    Patient Measurements: Height: 6' (182.9 cm) Weight: 246 lb 0.5 oz (111.6 kg) IBW/kg (Calculated) : 77.6 Adjusted Body Weight:    Vital Signs: Temp: 98 F (36.7 C) (12/26 0617) Temp src: Oral (12/26 0617) BP: 140/60 mmHg (12/26 0549) Pulse Rate: 72 (12/26 0617) Intake/Output from previous day: 12/25 0701 - 12/26 0700 In: 880 [P.O.:880] Out: -  Intake/Output from this shift:    Labs:  Recent Labs  11/04/13 0432 11/05/13 0550 11/06/13 0509  WBC 11.6* 12.9* 10.2  HGB 9.0* 8.8* 8.5*  PLT 475* 444* 414*  CREATININE 4.64* 4.92* 5.23*   Estimated Creatinine Clearance: 20.8 ml/min (by C-G formula based on Cr of 5.23). No results found for this basename: Letta Median, VANCORANDOM, [place], GENTPEAK, GENTRANDOM, TOBRATROUGH, TOBRAPEAK, TOBRARND, AMIKACINPEAK, AMIKACINTROU, AMIKACIN,  in the last 72 hours   Microbiology: Recent Results (from the past 720 hour(s))  URINE CULTURE     Status: None   Collection Time    10/22/13  3:10 PM      Result Value Range Status   Specimen Description URINE, RANDOM   Final   Special Requests NONE   Final   Culture  Setup Time     Final   Value: 10/22/2013 21:54     Performed at [facility]     Final   Value: NO GROWTH     Performed at [facility]   Culture     Final   Value: NO GROWTH     Performed at [facility]   Report Status 10/23/2013 FINAL   Final  MRSA PCR SCREENING     Status: None   Collection Time    10/26/13  9:51 PM      Result Value Range Status   MRSA by PCR NEGATIVE  NEGATIVE Final   Comment:            The GeneXpert MRSA Assay (FDA     approved for NASAL specimens     only), is one component of a     comprehensive MRSA colonization     surveillance program. It is not     intended to diagnose MRSA      infection nor to guide or     monitor treatment for     MRSA infections.  SURGICAL PCR SCREEN     Status: None   Collection Time    11/04/13  6:20 AM      Result Value Range Status   MRSA, PCR NEGATIVE  NEGATIVE Final   Staphylococcus aureus NEGATIVE  NEGATIVE Final   Comment:            The Xpert SA Assay (FDA     approved for NASAL specimens     in patients over 76 years of age),     is one component of     a comprehensive surveillance     program.  Test performance has     been validated by [employer] for patients greater     than or equal to 48 year old.     It is not intended     to diagnose infection nor to     guide or monitor treatment.    Anti-infectives   Start     Dose/Rate Route Frequency Ordered  Stop   11/01/13 1500  piperacillin-tazobactam (ZOSYN) IVPB 2.25 g     2.25 g 100 mL/hr over 30 Minutes Intravenous 3 times per day 11/01/13 1055     10/30/13 1800  piperacillin-tazobactam (ZOSYN) IVPB 2.25 g  Status:  Discontinued     2.25 g 100 mL/hr over 30 Minutes Intravenous Every 6 hours 10/30/13 1313 11/01/13 1055   10/29/13 0600  vancomycin (VANCOCIN) 1,500 mg in sodium chloride 0.9 % 500 mL IVPB  Status:  Discontinued     1,500 mg 250 mL/hr over 120 Minutes Intravenous Every 24 hours 10/28/13 1119 10/29/13 1209   10/27/13 1645  cefUROXime (ZINACEF) 1.5 g in dextrose 5 % 50 mL IVPB  Status:  Discontinued     1.5 g 100 mL/hr over 30 Minutes Intravenous Every 12 hours 10/27/13 1639 10/27/13 1642   10/22/13 0500  vancomycin (VANCOCIN) IVPB 1000 mg/200 mL premix  Status:  Discontinued     1,000 mg 200 mL/hr over 60 Minutes Intravenous Every 12 hours 10/21/13 1606 10/28/13 1119   10/22/13 0100  piperacillin-tazobactam (ZOSYN) IVPB 3.375 g  Status:  Discontinued     3.375 g 12.5 mL/hr over 240 Minutes Intravenous Every 8 hours 10/21/13 1606 10/30/13 1314   10/21/13 1615  vancomycin (VANCOCIN) 2,000 mg in sodium chloride 0.9 % 500 mL IVPB     2,000 mg 250  mL/hr over 120 Minutes Intravenous STAT 10/21/13 1606 10/21/13 2000   10/21/13 1615  piperacillin-tazobactam (ZOSYN) IVPB 3.375 g     3.375 g 100 mL/hr over 30 Minutes Intravenous STAT 10/21/13 1606 10/21/13 1728      Assessment: CC: L foot infxn  PMH: HTN, DM, OA  AC: None PTA, hep SQ and SCDs for VTE proph. Hgb drifting down to 8.5. Plts 414 (dropping)  ID: Zosyn D#[id number] for osteo - Afebrile, WBC 10.2 down, Scr back up to 5.23 - s/p amputation L4th and 5th toe + debridement of lateral foot+ arteriogram - s/p left foot amputation on 12/24  Vanc 12/10 >>12/18 Zosyn 12/10 >>  12/15 MRSA - neg 12/11 Urine - neg  12/12 VT 16.6 mg/L >> continue 1g q12h  12/15 VT 19.4>> cont 1g q12h  CV: Hx HTN -VSS - amlodipine10, asa81, lasix po, hydralazine, toprol (ACEi on hold)   Endo: Hx DM (A1c 6.7) - CBGs variable on SSI 146-243  GI/Nutr: Carb modified diet - LFTs WNL, PPI, docusate, MV  Neuro:   Renal: AKI from contrast vs. ACEi vs. Vanco - Scr 5.23, lytes ok, no need for urgent HD. Making some urine per renal note. Still fluid overloaded.  Pulm: 96% RA  Heme/Onc: H/H trend down to 8.5/24.2, Plt 414 declining  Best practices: hep SQ, SCDs  Plan: - Continue zosyn 2.25gm IV Q8H (dosed Q8H per renal request). - F/u renal plans, C&S, clinical status and LOT    Joetta Delprado S. Alford Highland, PharmD, BCPS Clinical Staff Pharmacist Pager [phone]  Eilene Ghazi Stillinger 11/06/2013,1:51 PM

## 2013-11-06 NOTE — Progress Notes (Signed)
Physical Therapy Treatment Patient Details Name: Alexander [name] MRN: [id number] DOB: 10-09-1961 Today's Date: 11/06/2013 Time: [id number] PT Time Calculation (min): 10 min  PT Assessment / Plan / Recommendation  History of Present Illness s/p fem-pop bypass, 2* PVD. underwent left trans-met amputation on 11/04/13   PT Comments   Patient safe with all mobility.  Patient modified independent ambulating with RW.  Patient deferred practicing stairs, stating they were "taken care of".  Feel patient safe for d/c from PT standpoint.  Able to maintain weight bearing through heel and has darco shoe at home (recommended to patient to use this shoe, as it is easier to maintain weight bearing through heel).  No further PT needs identified.  Will sign off.  Encouraged patient to continue to ambulate in room until discharge.   Follow Up Recommendations  No PT follow up     Does the patient have the potential to tolerate intense rehabilitation     Barriers to Discharge        Equipment Recommendations  None recommended by PT (patient reported he did not need 3-n-1)    Recommendations for Other Services    Frequency     Progress towards PT Goals Progress towards PT goals: Goals met/education completed, patient discharged from PT  Plan      Precautions / Restrictions Restrictions Weight Bearing Restrictions: Yes LLE Weight Bearing: Partial weight bearing LLE Partial Weight Bearing Percentage or Pounds: heel only Other Position/Activity Restrictions: heel weight bearing on DARCO shoe   Pertinent Vitals/Pain No pain    Mobility  Bed Mobility Bed Mobility: Supine to Sit Supine to Sit: 7: Independent Transfers Transfers: Sit to Stand;Stand to Sit Sit to Stand: 6: Modified independent (Device/Increase time);With upper extremity assist Stand to Sit: 6: Modified independent (Device/Increase time);With upper extremity assist Ambulation/Gait Ambulation/Gait Assistance: 6: Modified independent  (Device/Increase time) Ambulation Distance (Feet): 25 Feet Assistive device: Rolling walker Ambulation/Gait Assistance Details: patient able to ambulate with PWB through heel only.  No loss of balance noted Gait Pattern: Step-to pattern Stairs: No (patient reports stairs "already taken care of")    Exercises     PT Diagnosis:    PT Problem List:   PT Treatment Interventions:     PT Goals (current goals can now be found in the care plan section)    Visit Information  Last PT Received On: 11/06/13 Assistance Needed: +1 History of Present Illness: s/p fem-pop bypass, 2* PVD. underwent left trans-met amputation on 11/04/13    Subjective Data      Cognition  Cognition Arousal/Alertness: Awake/alert Behavior During Therapy: WFL for tasks assessed/performed Overall Cognitive Status: Within Functional Limits for tasks assessed    Balance  Balance Balance Assessed:  (no apparent balance deficits during mobility)  End of Session PT - End of Session Equipment Utilized During Treatment: Gait belt;Other (comment) (cast shoe, darco shoe not present) Activity Tolerance: Patient tolerated treatment well Patient left: in chair;with call bell/phone within reach   [place], [place], [place] 11/06/2013, 11:11 AM

## 2013-11-06 NOTE — Progress Notes (Signed)
Subjective: Left Foot Trans-Met Amp 11-04-13  Sitting up, doing well, still eager to go home  Objective: Vital signs in last 24 hours: Temp:  [98 F (36.7 C)-98.2 F (36.8 C)] 98.2 F (36.8 C) (12/26 1354) Pulse Rate:  [69-76] 76 (12/26 1354) Resp:  [18] 18 (12/26 1354) BP: (115-152)/(42-70) 152/66 mmHg (12/26 1354) SpO2:  [94 %-98 %] 98 % (12/26 1354) Weight:  [111.6 kg (246 lb 0.5 oz)] 111.6 kg (246 lb 0.5 oz) (12/26 0500)  Intake/Output from previous day: 12/25 0701 - 12/26 0700 In: 880 [P.O.:880] Out: -  Intake/Output this shift: Total I/O In: 360 [P.O.:360] Out: 400 [Urine:400]   Recent Labs  11/04/13 0432 11/05/13 0550 11/06/13 0509  HGB 9.0* 8.8* 8.5*    Recent Labs  11/05/13 0550 11/06/13 0509  WBC 12.9* 10.2  RBC 2.85* 2.70*  HCT 25.8* 24.2*  PLT 444* 414*    Recent Labs  11/05/13 0550 11/06/13 0509  NA 135 137  K 4.1 4.2  CL 96 97  CO2 26 25  BUN 41* 44*  CREATININE 4.92* 5.23*  GLUCOSE 164* 139*  CALCIUM 8.7 8.7   No results found for this basename: LABPT, INR,  in the last 72 hours  Foot dressing removed and penrose pulled.   Scant drainage from a couple portions of the incision.  Overall, looks good without purulent drainage or signifcant redness.  Tissues still swollen/puffy  Assessment/Plan: 1. I will plan to evaluate the wound again on Sunday 2. He may d/c anytime from ortho POV. 3. I informed patient that decision for d/c resides with hospitalist, with input from Nephrology. 4. If d/c prior to Sunday, pt to f/u with Dr. Berenice Primas next week for re-check.   Arnella Pralle A. 11/06/2013, 2:18 PM

## 2013-11-06 NOTE — Progress Notes (Signed)
   VASCULAR PROGRESS NOTE  SUBJECTIVE: Feels good.  PHYSICAL EXAM: Filed Vitals:   11/05/13 2237 11/06/13 0500 11/06/13 0549 11/06/13 0617  BP: 130/70  140/60   Pulse:    72  Temp:    98 F (36.7 C)  TempSrc:    Oral  Resp:    18  Height:      Weight:  246 lb 0.5 oz (111.6 kg)    SpO2:    96%   Palpable graft pulse left leg Incisions look good.  LABS: Lab Results  Component Value Date   WBC 10.2 11/06/2013   HGB 8.5* 11/06/2013   HCT 24.2* 11/06/2013   MCV 89.6 11/06/2013   PLT 414* 11/06/2013   Lab Results  Component Value Date   CREATININE 5.23* 11/06/2013   Lab Results  Component Value Date   INR 1.25 10/21/2013   CBG (last 3)   Recent Labs  11/05/13 1623 11/05/13 2229 11/06/13 0631  GLUCAP 189* 172* 148*    Principal Problem:   Osteomyelitis of left foot Active Problems:   DIABETES MELLITUS, TYPE II   HYPERTENSION   Leukocytosis   Anemia   PAD (peripheral artery disease)   Acute renal failure   Volume overload   ASSESSMENT AND PLAN:  * Left Fem-TPT  Bypass with vein (POD 10)  *  S/p left TMA by Dr. Berenice Primas  * OK for d/c from vascular standpoint. I will arrange f/u.  Gae Gallop [id number] 11/06/2013

## 2013-11-07 LAB — CBC
MCH: 31.3 pg (ref 26.0–34.0)
MCV: 89.7 fL (ref 78.0–100.0)
Platelets: 358 10*3/uL (ref 150–400)
RBC: 2.62 MIL/uL — ABNORMAL LOW (ref 4.22–5.81)
RDW: 12.4 % (ref 11.5–15.5)
WBC: 9.9 10*3/uL (ref 4.0–10.5)

## 2013-11-07 LAB — RENAL FUNCTION PANEL
Albumin: 2.3 g/dL — ABNORMAL LOW (ref 3.5–5.2)
BUN: 45 mg/dL — ABNORMAL HIGH (ref 6–23)
CO2: 24 mEq/L (ref 19–32)
Calcium: 8.4 mg/dL (ref 8.4–10.5)
Chloride: 94 mEq/L — ABNORMAL LOW (ref 96–112)
Creatinine, Ser: 4.66 mg/dL — ABNORMAL HIGH (ref 0.50–1.35)
Phosphorus: 5 mg/dL — ABNORMAL HIGH (ref 2.3–4.6)
Potassium: 3.7 mEq/L (ref 3.5–5.1)

## 2013-11-07 LAB — TYPE AND SCREEN
ABO/RH(D): A POS
Antibody Screen: NEGATIVE
Unit division: 0

## 2013-11-07 LAB — GLUCOSE, CAPILLARY
Glucose-Capillary: 269 mg/dL — ABNORMAL HIGH (ref 70–99)
Glucose-Capillary: 192 mg/dL — ABNORMAL HIGH (ref 70–99)

## 2013-11-07 NOTE — Progress Notes (Addendum)
[facility] Progress Note  11/07/2013 8:13 AM 3 Days Post-Op  Subjective:  Ready to go home    Filed Vitals:   11/07/13 0535  BP: 164/69  Pulse: 69  Temp: 97.8 F (36.6 C)  Resp: 18    Physical Exam: Incisions:  Healing nicely Extremities:  Ace wrap to knee from ortho surgery-clean and dry  CBC    Component Value Date/Time   WBC 10.2 11/06/2013 0509   RBC 2.70* 11/06/2013 0509   RBC 3.32* 10/25/2013 1636   HGB 8.5* 11/06/2013 0509   HCT 24.2* 11/06/2013 0509   PLT 414* 11/06/2013 0509   MCV 89.6 11/06/2013 0509   MCH 31.5 11/06/2013 0509   MCHC 35.1 11/06/2013 0509   RDW 12.5 11/06/2013 0509   LYMPHSABS 1.4 10/21/2013 2007   MONOABS 1.2* 10/21/2013 2007   EOSABS 0.0 10/21/2013 2007   BASOSABS 0.0 10/21/2013 2007    BMET    Component Value Date/Time   NA 137 11/06/2013 0509   K 4.2 11/06/2013 0509   CL 97 11/06/2013 0509   CO2 25 11/06/2013 0509   GLUCOSE 139* 11/06/2013 0509   BUN 44* 11/06/2013 0509   CREATININE 5.23* 11/06/2013 0509   CALCIUM 8.7 11/06/2013 0509   GFRNONAA 11* 11/06/2013 0509   GFRAA 13* 11/06/2013 0509    INR    Component Value Date/Time   INR 1.25 10/21/2013 2007     Intake/Output Summary (Last 24 hours) at 11/07/13 0813 Last data filed at 11/07/13 0540  Gross per 24 hour  Intake   1660 ml  Output   1500 ml  Net    160 ml     Assessment:  52 y.o. male is s/p:  1. Left common femoral to tibial peroneal trunk bypass with non-reversed translocated saphenous vein graft  2. Intraoperative arteriogram  3. Ray amputation of the left fourth toe and debridement of lateral foot  11 Days Post-Op And  Transmet amputation left foot (ortho) 3 Days Post-op  Plan: -okay to discharge from vascular standpoint -pt has appt with Dr. Scot Dock on 11/25/13    Leontine Locket, PA-C [facility] [phone] 11/07/2013 8:13 AM  Agree with above. Palpable graft pulse in left leg. Incisions look  fine. I will arrange F/U as as outpt.  [name], MD, [place] [phone] 11/07/2013

## 2013-11-07 NOTE — Progress Notes (Signed)
[facility] PROGRESS NOTE  TAKEEM KUCHER [id number] DOB: 11-25-1960 DOA: 10/21/2013 PCP: No primary provider on file.  Off service summary: Patient is a 52 year old Caucasian male with history of diabetes on oral hypoglycemic agents, hypertension, and history of left foot ulcer. Patient approximately one month ago status post amputation of fifth ray but since operation his wound has not healed well and patient presented with dusky fourth toe and open and draining foul-smelling wound. Was found to have osteomyelitis. Orthopedic surgeon and vascular surgeon on board and plans are for further treatment from a surgical standpoint at [facility].  Upon transfer to [facility], the patient was seen by Orthopedic surgery and ultimately underwent partial L foot amputation on 10/27/13. Included was an intra-operative arteriogram. Post-operatively, the patient was noted to have acute renal failure, likely secondary to contrast nephropathy. He has since been continued on aggressive IVFinitailly which has now been d/c'd and patient now on IV diuretics. Renal function seems to have plateaued.    Assessment/Plan: Osteomyelitis of left foot  - Orthopedic surgeon and vascular surgeon on board following - Continue broad-spectrum IV antibiotics at this point - sp L common femoral to tibial peroneal trunk bypass, intraoperative arteriogram, and amputation of L4th toe and debridement of lateral foot Patient status post transmetatarsal amputation 11/04/2013 per orthopedics. Per vascular and ortho.    DIABETES MELLITUS, TYPE II  - Continue sliding scale insulin. - Blood sugars relatively well controlled currently  HYPERTENSION  - continue metoprolol XL to 150mg  daily - Hold ACEI secondary to ARF (see below) - Continue hydralazine 10 mg every 6 hours IV PRN for better BP control  - Norvasc10 mg daily and follow. continue hydralazine to 100mg  TID.  Hyponatremia  - Most likely due to volume overload. Sodium  levels improved with diuresis. Continue diuretics.   Leukocytosis  - secondary to left foot osteomyelitis  - WBC count trending down. - Continue IV antibiotics  Zosyn. Orthopedics and vascular surgery following.  Anemia  - normocytic - Has remained stable thus far - Cont to monitor  ARF - Cr started to trend back up BMET pending and yesterday at 5.23 from 4.92 from 4.64 from 4.33 from 4.65 from 4.89 with UOP improving with diuretics. - suspect contrast nephropathy and ACE inhibitor, and/or vancomycin toxicity - Continue IV Lasix per renal. - Monitor renal fx - Hold ACEI and other nephrotoxic drugs until renal function improves Renal following and appreciate input and recommendations.  Volume overload Responding to diuretics. Follow.  Code Status: full Family Communication: Discussed with patient, no family at bedside Disposition Plan: When medically stable and ok with renal.  Consultants:  Orthopaedic surgery  Vascular surgery  nephrology  Procedures:  ABI  MRI of left foot  Arteriogram 10/27/13  L common femoral to tibial peroneal trunk bypass 10/27/13  Amputation of L4th toe and debridement of lateral foot 10/27/13  Status post left foot transmetatarsal amputation good wound closure 11/04/2013 per Dr. Berenice Primas  Antibiotics:  Vancomycin d/c'd 10/29/13   zosyn  HPI/Subjective: No new complaints. S/p surgery with left femoral to tibial peroneal trunk bypass with graft. Patient states some improvement with edema. Patient denies any CP. No SOB. Patient eager to go home.  Objective: Filed Vitals:   11/07/13 0535  BP: 164/69  Pulse: 69  Temp: 97.8 F (36.6 C)  Resp: 18    Intake/Output Summary (Last 24 hours) at 11/07/13 1111 Last data filed at 11/07/13 0540  Gross per 24 hour  Intake   1660 ml  Output   1500 ml  Net    160 ml   Filed Weights   11/05/13 0530 11/06/13 0500 11/07/13 0550  Weight: 114.6 kg (252 lb 10.4 oz) 111.6 kg (246 lb 0.5 oz)  110.36 kg (243 lb 4.8 oz)    Exam:   General:  Pt in NAD, Alert and Awake  Cardiovascular: RRR, no MRG  Respiratory: CTA BL, no wheezes  Abdomen: soft, NT, ND  Musculoskeletal: no cyanosis or clubbing. Patient has ulcer at left foot bandaged. 1-2 + BLE edema  Data Reviewed: Basic Metabolic Panel:  Recent Labs Lab 11/03/13 0635 11/04/13 0432 11/05/13 0550 11/06/13 0509 11/07/13 1015  NA 135 136 135 137 130*  K 4.9 4.0 4.1 4.2 3.7  CL 99 99 96 97 94*  CO2 20 24 26 25 24   GLUCOSE 165* 144* 164* 139* 324*  BUN 35* 37* 41* 44* 45*  CREATININE 4.33* 4.64* 4.92* 5.23* 4.66*  CALCIUM 8.5 8.6 8.7 8.7 8.4  PHOS  --   --   --  6.7* 5.0*   Liver Function Tests:  Recent Labs Lab 11/06/13 0509 11/07/13 1015  ALBUMIN 2.4* 2.3*   No results found for this basename: LIPASE, AMYLASE,  in the last 168 hours No results found for this basename: AMMONIA,  in the last 168 hours CBC:  Recent Labs Lab 11/03/13 0635 11/04/13 0432 11/05/13 0550 11/06/13 0509 11/07/13 1015  WBC 11.0* 11.6* 12.9* 10.2 9.9  HGB 8.2* 9.0* 8.8* 8.5* 8.2*  HCT 26.0* 26.3* 25.8* 24.2* 23.5*  MCV 89.0 89.8 90.5 89.6 89.7  PLT 457* 475* 444* 414* 358   Cardiac Enzymes: No results found for this basename: CKTOTAL, CKMB, CKMBINDEX, TROPONINI,  in the last 168 hours BNP (last 3 results) No results found for this basename: PROBNP,  in the last 8760 hours CBG:  Recent Labs Lab 11/06/13 0631 11/06/13 1114 11/06/13 1626 11/06/13 2133 11/07/13 0623  GLUCAP 148* 243* 167* 193* 173*    Recent Results (from the past 240 hour(s))  SURGICAL PCR SCREEN     Status: None   Collection Time    11/04/13  6:20 AM      Result Value Range Status   MRSA, PCR NEGATIVE  NEGATIVE Final   Staphylococcus aureus NEGATIVE  NEGATIVE Final   Comment:            The Xpert SA Assay (FDA     approved for NASAL specimens     in patients over 79 years of age),     is one component of     a comprehensive surveillance      program.  Test performance has     been validated by [employer] for patients greater     than or equal to 44 year old.     It is not intended     to diagnose infection nor to     guide or monitor treatment.     Studies: No results found.  Scheduled Meds: . amLODipine  10 mg Oral Daily  . aspirin EC  81 mg Oral Daily  . docusate sodium  100 mg Oral Daily  . furosemide  80 mg Oral BID  . heparin subcutaneous  5,000 Units Subcutaneous Q8H  . hydrALAZINE  100 mg Oral Q8H  . insulin aspart  0-15 Units Subcutaneous TID WC  . metoprolol succinate  150 mg Oral Daily  . multivitamin with minerals  1 tablet Oral Daily  . pantoprazole  40 mg Oral Daily  . piperacillin-tazobactam (ZOSYN)  IV  2.25 g Intravenous Q8H  . sodium chloride  3 mL Intravenous Q12H   Continuous Infusions:    Principal Problem:   Osteomyelitis of left foot Active Problems:   DIABETES MELLITUS, TYPE II   HYPERTENSION   Leukocytosis   Anemia   PAD (peripheral artery disease)   Acute renal failure   Volume overload  Time spent: > 35 minutes  THOMPSON,DANIEL MD [facility] Pager [phone] If 7PM-7AM, please contact night-coverage at [email or web address], password [facility] 11/07/2013, 11:11 AM  LOS: 17 days

## 2013-11-07 NOTE — Progress Notes (Signed)
[facility] ROUNDING NOTE   Subjective:   Interval History: some loose stools   Objective:  Vital signs in last 24 hours:  Temp:  [97.8 F (36.6 C)-98.9 F (37.2 C)] 97.8 F (36.6 C) (12/27 0535) Pulse Rate:  [69-76] 69 (12/27 0535) Resp:  [18] 18 (12/27 0535) BP: (107-164)/(48-69) 164/69 mmHg (12/27 0535) SpO2:  [95 %-98 %] 95 % (12/27 0535) Weight:  [110.36 kg (243 lb 4.8 oz)] 110.36 kg (243 lb 4.8 oz) (12/27 0550)  Weight change: -1.24 kg (-2 lb 11.7 oz) Filed Weights   11/05/13 0530 11/06/13 0500 11/07/13 0550  Weight: 114.6 kg (252 lb 10.4 oz) 111.6 kg (246 lb 0.5 oz) 110.36 kg (243 lb 4.8 oz)    Intake/Output: I/O last 3 completed shifts: In: 2060 [P.O.:1960; IV Piggyback:100] Out: 1500 [Urine:1500]   Intake/Output this shift:     CVS- RRR RS- CTA ABD- BS present soft non-distended EXT- 2+  Edema R     Bandaged L   Basic Metabolic Panel:  Recent Labs Lab 11/02/13 0705 11/03/13 0635 11/04/13 0432 11/05/13 0550 11/06/13 0509  NA 135 135 136 135 137  K 4.3 4.9 4.0 4.1 4.2  CL 99 99 99 96 97  CO2 23 20 24 26 25   GLUCOSE 149* 165* 144* 164* 139*  BUN 34* 35* 37* 41* 44*  CREATININE 4.65* 4.33* 4.64* 4.92* 5.23*  CALCIUM 8.7 8.5 8.6 8.7 8.7  PHOS  --   --   --   --  6.7*    Liver Function Tests:  Recent Labs Lab 11/06/13 0509  ALBUMIN 2.4*   No results found for this basename: LIPASE, AMYLASE,  in the last 168 hours No results found for this basename: AMMONIA,  in the last 168 hours  CBC:  Recent Labs Lab 11/03/13 0635 11/04/13 0432 11/05/13 0550 11/06/13 0509 11/07/13 1015  WBC 11.0* 11.6* 12.9* 10.2 9.9  HGB 8.2* 9.0* 8.8* 8.5* 8.2*  HCT 26.0* 26.3* 25.8* 24.2* 23.5*  MCV 89.0 89.8 90.5 89.6 89.7  PLT 457* 475* 444* 414* 358    Cardiac Enzymes: No results found for this basename: CKTOTAL, CKMB, CKMBINDEX, TROPONINI,  in the last 168 hours  BNP: No components found with this basename: POCBNP,   CBG:  Recent  Labs Lab 11/06/13 0631 11/06/13 1114 11/06/13 1626 11/06/13 2133 11/07/13 0623  GLUCAP 148* 243* 167* 193* 173*    Microbiology: Results for orders placed during the hospital encounter of 10/21/13  URINE CULTURE     Status: None   Collection Time    10/22/13  3:10 PM      Result Value Range Status   Specimen Description URINE, RANDOM   Final   Special Requests NONE   Final   Culture  Setup Time     Final   Value: 10/22/2013 21:54     Performed at [facility]     Final   Value: NO GROWTH     Performed at [facility]   Culture     Final   Value: NO GROWTH     Performed at [facility]   Report Status 10/23/2013 FINAL   Final  MRSA PCR SCREENING     Status: None   Collection Time    10/26/13  9:51 PM      Result Value Range Status   MRSA by PCR NEGATIVE  NEGATIVE Final   Comment:  The GeneXpert MRSA Assay (FDA     approved for NASAL specimens     only), is one component of a     comprehensive MRSA colonization     surveillance program. It is not     intended to diagnose MRSA     infection nor to guide or     monitor treatment for     MRSA infections.  SURGICAL PCR SCREEN     Status: None   Collection Time    11/04/13  6:20 AM      Result Value Range Status   MRSA, PCR NEGATIVE  NEGATIVE Final   Staphylococcus aureus NEGATIVE  NEGATIVE Final   Comment:            The Xpert SA Assay (FDA     approved for NASAL specimens     in patients over 56 years of age),     is one component of     a comprehensive surveillance     program.  Test performance has     been validated by [employer] for patients greater     than or equal to 38 year old.     It is not intended     to diagnose infection nor to     guide or monitor treatment.    Coagulation Studies: No results found for this basename: LABPROT, INR,  in the last 72 hours  Urinalysis: No results found for this basename: COLORURINE, APPERANCEUR, LABSPEC,  PHURINE, GLUCOSEU, HGBUR, BILIRUBINUR, KETONESUR, PROTEINUR, UROBILINOGEN, NITRITE, LEUKOCYTESUR,  in the last 72 hours    Imaging: No results found.   Medications:     . amLODipine  10 mg Oral Daily  . aspirin EC  81 mg Oral Daily  . docusate sodium  100 mg Oral Daily  . furosemide  80 mg Oral BID  . heparin subcutaneous  5,000 Units Subcutaneous Q8H  . hydrALAZINE  100 mg Oral Q8H  . insulin aspart  0-15 Units Subcutaneous TID WC  . metoprolol succinate  150 mg Oral Daily  . multivitamin with minerals  1 tablet Oral Daily  . pantoprazole  40 mg Oral Daily  . piperacillin-tazobactam (ZOSYN)  IV  2.25 g Intravenous Q8H  . sodium chloride  3 mL Intravenous Q12H   acetaminophen, acetaminophen, bisacodyl, guaiFENesin-dextromethorphan, hydrALAZINE, HYDROcodone-acetaminophen, labetalol, metoprolol, morphine injection, ondansetron, oxyCODONE-acetaminophen, phenol, senna-docusate  Assessment/ Plan:  1.Acute kidney injury: non-oliguric. Multiple renal insults including contrast nephropathy (+acei) and/or vanc toxicity. (pre-op Scr 0.8-0.93)  1. vanco stopped.  2. Avoid further contrast and other nephrotoxins.  3. Keep sbp over 110.  4. Adjusted zosyn dose for renal failure (q8h not q6) 5. Normal complements, low Uprot, low FeNa c/w pre-renal but has marked peripheral edema. Continue to follow.  2.No urgent indication for HD - creatinine is trending up but still making urine. I think just continued supportive care is indicated and wait for it to plateau again.  switched to oral lasix and still diuresing     weight down today   3 L foot ulcer / left leg fem-tib/peroneal bypass 12/16: f/b ortho and vascular  1. Status post transmet amputation on 12/24. Edema is much better-  changed to oral lasix yesterday  4.Volume overload: Switched to oral lasix.   5. Hyponatremia-Improved after diuresis.   6.ABLA/Anemia- dropping Hgb Will follow post op trend.   7 DM, longstanding   8 htn-  better on multidrug reg- norvasc10, hydralazine 100tid, toprol xl  150 and lasix  Waiting on labs today hopefully will start trending to baseline  I would recommend staying over weekend     LOS: 17 Alexander [name] W @TODAY @10 :35 AM

## 2013-11-08 LAB — RENAL FUNCTION PANEL
Albumin: 2.4 g/dL — ABNORMAL LOW (ref 3.5–5.2)
CO2: 25 mEq/L (ref 19–32)
Calcium: 8.7 mg/dL (ref 8.4–10.5)
Chloride: 95 mEq/L — ABNORMAL LOW (ref 96–112)
GFR calc Af Amer: 15 mL/min — ABNORMAL LOW (ref >90)
GFR calc non Af Amer: 13 mL/min — ABNORMAL LOW (ref >90)
Glucose, Bld: 188 mg/dL — ABNORMAL HIGH (ref 70–99)
Potassium: 3.8 mEq/L (ref 3.5–5.1)
Sodium: 132 mEq/L — ABNORMAL LOW (ref 135–145)

## 2013-11-08 LAB — GLUCOSE, CAPILLARY
Glucose-Capillary: 177 mg/dL — ABNORMAL HIGH (ref 70–99)
Glucose-Capillary: 186 mg/dL — ABNORMAL HIGH (ref 70–99)
Glucose-Capillary: 169 mg/dL — ABNORMAL HIGH (ref 70–99)
Glucose-Capillary: 243 mg/dL — ABNORMAL HIGH (ref 70–99)
Glucose-Capillary: 241 mg/dL — ABNORMAL HIGH (ref 70–99)

## 2013-11-08 NOTE — Progress Notes (Signed)
[facility] PROGRESS NOTE  DNIEL HERZING [id number] DOB: 1961-02-16 DOA: 10/21/2013 PCP: No primary provider on file.  Off service summary: Patient is a 52 year old Caucasian male with history of diabetes on oral hypoglycemic agents, hypertension, and history of left foot ulcer. Patient approximately one month ago status post amputation of fifth ray but since operation his wound has not healed well and patient presented with dusky fourth toe and open and draining foul-smelling wound. Was found to have osteomyelitis. Orthopedic surgeon and vascular surgeon on board and plans are for further treatment from a surgical standpoint at [facility].  Upon transfer to [facility], the patient was seen by Orthopedic surgery and ultimately underwent partial L foot amputation on 10/27/13. Included was an intra-operative arteriogram. Post-operatively, the patient was noted to have acute renal failure, likely secondary to contrast nephropathy. He has since been continued on aggressive IVFinitailly which has now been d/c'd and patient now on IV diuretics. Renal function seems to have plateaued.    Assessment/Plan: Osteomyelitis of left foot  - Orthopedic surgeon and vascular surgeon on board following - Continue broad-spectrum IV antibiotics at this point - sp L common femoral to tibial peroneal trunk bypass, intraoperative arteriogram, and amputation of L4th toe and debridement of lateral foot Patient status post transmetatarsal amputation 11/04/2013 per orthopedics. Per vascular and ortho.    DIABETES MELLITUS, TYPE II  - Continue sliding scale insulin. - Blood sugars relatively well controlled currently  HYPERTENSION  - continue metoprolol XL to 150mg  daily - Hold ACEI secondary to ARF (see below) - Continue hydralazine 10 mg every 6 hours IV PRN for better BP control  - Norvasc10 mg daily and follow. continue hydralazine to 100mg  TID.  Hyponatremia  - Most likely due to volume overload. Sodium  levels improved with diuresis. Continue diuretics.   Leukocytosis  - secondary to left foot osteomyelitis  - WBC count trending down. - Continue IV antibiotics  Zosyn. Orthopedics and vascular surgery following.  Anemia  - normocytic - Has remained stable thus far - Cont to monitor  ARF - suspect contrast nephropathy and ACE inhibitor, and/or vancomycin toxicity - Creatinine peaked at 5.23 on 12/26, Monitor renal fx - Continue Holding ACEI and other nephrotoxic drugs until renal function improves -Continue oral Lasix as per renal -Trending down-4.62 today, follow -Renal recommended to keep over weekend, appreciate input and recommendations.  Volume overload Responding to diuretics. Follow.  Code Status: full Family Communication: Discussed with patient, no family at bedside Disposition Plan: When medically stable and ok with renal.  Consultants:  Orthopaedic surgery  Vascular surgery  nephrology  Procedures:  ABI  MRI of left foot  Arteriogram 10/27/13  L common femoral to tibial peroneal trunk bypass 10/27/13  Amputation of L4th toe and debridement of lateral foot 10/27/13  Status post left foot transmetatarsal amputation good wound closure 11/04/2013 per Dr. Berenice Primas  Antibiotics:  Vancomycin d/c'd 10/29/13   zosyn  HPI/Subjective: Sitting up in chair, No new complaints.  Objective: Filed Vitals:   11/08/13 1450  BP: 148/62  Pulse: 66  Temp: 97.7 F (36.5 C)  Resp: 20    Intake/Output Summary (Last 24 hours) at 11/08/13 1456 Last data filed at 11/08/13 1038  Gross per 24 hour  Intake    643 ml  Output   1200 ml  Net   -557 ml   Filed Weights   11/06/13 0500 11/07/13 0550 11/08/13 0500  Weight: 111.6 kg (246 lb 0.5 oz) 110.36 kg (243 lb 4.8 oz)  112.038 kg (247 lb)    Exam:   General:  Pt in NAD, Alert and Awake  Cardiovascular: RRR, no MRG  Respiratory: CTA BL, no wheezes  Abdomen: soft, NT, ND  Musculoskeletal: no cyanosis or  clubbing. Patient left foot with dressing clean and dry.  +1 BLE edema  Data Reviewed: Basic Metabolic Panel:  Recent Labs Lab 11/04/13 0432 11/05/13 0550 11/06/13 0509 11/07/13 1015 11/08/13 0800  NA 136 135 137 130* 132*  K 4.0 4.1 4.2 3.7 3.8  CL 99 96 97 94* 95*  CO2 24 26 25 24 25   GLUCOSE 144* 164* 139* 324* 188*  BUN 37* 41* 44* 45* 48*  CREATININE 4.64* 4.92* 5.23* 4.66* 4.62*  CALCIUM 8.6 8.7 8.7 8.4 8.7  PHOS  --   --  6.7* 5.0* 5.0*   Liver Function Tests:  Recent Labs Lab 11/06/13 0509 11/07/13 1015 11/08/13 0800  ALBUMIN 2.4* 2.3* 2.4*   No results found for this basename: LIPASE, AMYLASE,  in the last 168 hours No results found for this basename: AMMONIA,  in the last 168 hours CBC:  Recent Labs Lab 11/03/13 0635 11/04/13 0432 11/05/13 0550 11/06/13 0509 11/07/13 1015  WBC 11.0* 11.6* 12.9* 10.2 9.9  HGB 8.2* 9.0* 8.8* 8.5* 8.2*  HCT 26.0* 26.3* 25.8* 24.2* 23.5*  MCV 89.0 89.8 90.5 89.6 89.7  PLT 457* 475* 444* 414* 358   Cardiac Enzymes: No results found for this basename: CKTOTAL, CKMB, CKMBINDEX, TROPONINI,  in the last 168 hours BNP (last 3 results) No results found for this basename: PROBNP,  in the last 8760 hours CBG:  Recent Labs Lab 11/07/13 1149 11/07/13 1612 11/07/13 2138 11/08/13 0642 11/08/13 1124  GLUCAP 269* 192* 177* 186* 169*    Recent Results (from the past 240 hour(s))  SURGICAL PCR SCREEN     Status: None   Collection Time    11/04/13  6:20 AM      Result Value Range Status   MRSA, PCR NEGATIVE  NEGATIVE Final   Staphylococcus aureus NEGATIVE  NEGATIVE Final   Comment:            The Xpert SA Assay (FDA     approved for NASAL specimens     in patients over 2 years of age),     is one component of     a comprehensive surveillance     program.  Test performance has     been validated by [employer] for patients greater     than or equal to 36 year old.     It is not intended     to diagnose  infection nor to     guide or monitor treatment.     Studies: No results found.  Scheduled Meds: . amLODipine  10 mg Oral Daily  . aspirin EC  81 mg Oral Daily  . docusate sodium  100 mg Oral Daily  . furosemide  80 mg Oral BID  . heparin subcutaneous  5,000 Units Subcutaneous Q8H  . hydrALAZINE  100 mg Oral Q8H  . insulin aspart  0-15 Units Subcutaneous TID WC  . metoprolol succinate  150 mg Oral Daily  . multivitamin with minerals  1 tablet Oral Daily  . pantoprazole  40 mg Oral Daily  . piperacillin-tazobactam (ZOSYN)  IV  2.25 g Intravenous Q8H  . sodium chloride  3 mL Intravenous Q12H   Continuous Infusions:    Principal Problem:   Osteomyelitis of  left foot Active Problems:   DIABETES MELLITUS, TYPE II   HYPERTENSION   Leukocytosis   Anemia   PAD (peripheral artery disease)   Acute renal failure   Volume overload  Time spent: 25 minutes  Sheila Oats MD [facility] Pager [phone] If 7PM-7AM, please contact night-coverage at [email or web address], password [facility] 11/08/2013, 2:56 PM  LOS: 18 days

## 2013-11-08 NOTE — Progress Notes (Signed)
Patient ID: Alexander [name], male   DOB: 1961-06-17, 52 y.o.   MRN: [id number]    [facility] Progress Note    Assessment/ Plan:   1.Acute kidney injury: non-oliguric. Multiple renal insults primarily CINP +/- hemodynamic injury with ongoing ACE-I (vancomycin trough NOT convincing as cause for AKI). Creatinine trending down slowly but still far away from baseline- fair UOP in response to lasix with improvement in volume overload. (baseline creatinine 0.8-0.9) . He is agreeable to stay one more day to trend creatinine and have a confirmed follow up appointment 2. L foot ulcer / left leg fem-tib/peroneal bypass 12/16: f/b ortho and vascular and status post TMT amputation on 12/24.  3. Volume overload: slowly responsive to lasix.  4. ABLA/Anemia- Hgb Low as expected with post-op losses and associated ESA resistance from inflammation 5. DM, longstanding  6. Hypertension: fair control on current regime- anticipate to improve further with restoration of euvolemic state.  ++++ He does NOT have a PCP and I have urged him to establish care with one, will set up for renal follow up with one of the doctors that have been following him here.  Subjective:   Reports to be feeling fair with some improvement in RLE swelling   Objective:   BP 148/62  Pulse 66  Temp(Src) 97.7 F (36.5 C) (Oral)  Resp 20  Ht 6' (1.829 m)  Wt 112.038 kg (247 lb)  BMI 33.49 kg/m2  SpO2 100%  Intake/Output Summary (Last 24 hours) at 11/08/13 1452 Last data filed at 11/08/13 1038  Gross per 24 hour  Intake    643 ml  Output   1200 ml  Net   -557 ml   Weight change: 1.678 kg (3 lb 11.2 oz)  Physical Exam: [id number] up in recliner [id number] RRR, Normal S1 and S2 Resp: decreased BS over bases otherwise CTA [id number], flat, NT, BS normal Ext:3+ RLE edema. LLE in ACE wrap  Imaging: No results found.  Labs: BMET  Recent Labs Lab 11/02/13 0705 11/03/13 0635 11/04/13 0432 11/05/13 0550  11/06/13 0509 11/07/13 1015 11/08/13 0800  NA 135 135 136 135 137 130* 132*  K 4.3 4.9 4.0 4.1 4.2 3.7 3.8  CL 99 99 99 96 97 94* 95*  CO2 23 20 24 26 25 24 25   GLUCOSE 149* 165* 144* 164* 139* 324* 188*  BUN 34* 35* 37* 41* 44* 45* 48*  CREATININE 4.65* 4.33* 4.64* 4.92* 5.23* 4.66* 4.62*  CALCIUM 8.7 8.5 8.6 8.7 8.7 8.4 8.7  PHOS  --   --   --   --  6.7* 5.0* 5.0*   CBC  Recent Labs Lab 11/04/13 0432 11/05/13 0550 11/06/13 0509 11/07/13 1015  WBC 11.6* 12.9* 10.2 9.9  HGB 9.0* 8.8* 8.5* 8.2*  HCT 26.3* 25.8* 24.2* 23.5*  MCV 89.8 90.5 89.6 89.7  PLT 475* 444* 414* 358   Medications:    . amLODipine  10 mg Oral Daily  . aspirin EC  81 mg Oral Daily  . docusate sodium  100 mg Oral Daily  . furosemide  80 mg Oral BID  . heparin subcutaneous  5,000 Units Subcutaneous Q8H  . hydrALAZINE  100 mg Oral Q8H  . insulin aspart  0-15 Units Subcutaneous TID WC  . metoprolol succinate  150 mg Oral Daily  . multivitamin with minerals  1 tablet Oral Daily  . pantoprazole  40 mg Oral Daily  . piperacillin-tazobactam (ZOSYN)  IV  2.25 g Intravenous Q8H  . sodium chloride  3 mL Intravenous Q12H   Elmarie Shiley, MD 11/08/2013, 2:52 PM

## 2013-11-08 NOTE — Progress Notes (Addendum)
[facility] Progress Note  11/08/2013 9:24 AM 4 Days Post-Op  Subjective:  Wants to go home  Afebrile VSS 95% RA  Filed Vitals:   11/08/13 0510  BP: 151/65  Pulse: 71  Temp: 98.4 F (36.9 C)  Resp: 20    Physical Exam: Incisions:  Healing nicely Extremities:  + palpable graft pulse;  ACE bandage from foot to knee in tact and clean.  CBC    Component Value Date/Time   WBC 9.9 11/07/2013 1015   RBC 2.62* 11/07/2013 1015   RBC 3.32* 10/25/2013 1636   HGB 8.2* 11/07/2013 1015   HCT 23.5* 11/07/2013 1015   PLT 358 11/07/2013 1015   MCV 89.7 11/07/2013 1015   MCH 31.3 11/07/2013 1015   MCHC 34.9 11/07/2013 1015   RDW 12.4 11/07/2013 1015   LYMPHSABS 1.4 10/21/2013 2007   MONOABS 1.2* 10/21/2013 2007   EOSABS 0.0 10/21/2013 2007   BASOSABS 0.0 10/21/2013 2007    BMET    Component Value Date/Time   NA 132* 11/08/2013 0800   K 3.8 11/08/2013 0800   CL 95* 11/08/2013 0800   CO2 25 11/08/2013 0800   GLUCOSE 188* 11/08/2013 0800   BUN 48* 11/08/2013 0800   CREATININE 4.62* 11/08/2013 0800   CALCIUM 8.7 11/08/2013 0800   GFRNONAA 13* 11/08/2013 0800   GFRAA 15* 11/08/2013 0800    INR    Component Value Date/Time   INR 1.25 10/21/2013 2007     Intake/Output Summary (Last 24 hours) at 11/08/13 0924 Last data filed at 11/08/13 0740  Gross per 24 hour  Intake    640 ml  Output   1200 ml  Net   -560 ml     Assessment:  52 y.o. male is s/p:  1. Left common femoral to tibial peroneal trunk bypass with non-reversed translocated saphenous vein graft  2. Intraoperative arteriogram  3. Ray amputation of the left fourth toe and debridement of lateral foot  12 Days Post-Op  And  Transmet amputation left foot (ortho)  4 Days Post-Op  Plan: -pt doing well from vascular surgery standpoint. -okay to discharge from our standpoint -pt does have f/u appt with Dr. Scot Dock on 11/25/13    Leontine Locket, PA-C [facility] [phone] 11/08/2013 9:24 AM  Agree with above. Palpable graft pulse. Incisions all look good.  [name], MD, [place] [phone] 11/08/2013

## 2013-11-08 NOTE — Progress Notes (Signed)
Subjective: Left Foot Trans-Met Amp 11-04-13  Sitting in chair.  No c/o about foot.  He reports that the dressing was not changed yesterday as ordered.  Objective: Vital signs in last 24 hours: Temp:  [97.9 F (36.6 C)-98.4 F (36.9 C)] 98.4 F (36.9 C) (12/28 0510) Pulse Rate:  [66-71] 67 (12/28 1041) Resp:  [18-20] 20 (12/28 0510) BP: (129-153)/(39-65) 153/62 mmHg (12/28 1041) SpO2:  [95 %-96 %] 95 % (12/28 0510) Weight:  [112.038 kg (247 lb)] 112.038 kg (247 lb) (12/28 0500)  Intake/Output from previous day: 12/27 0701 - 12/28 0700 In: 400 [P.O.:400] Out: 1000 [Urine:1000] Intake/Output this shift: Total I/O In: 243 [P.O.:240; I.V.:3] Out: 200 [Urine:200]   Recent Labs  11/06/13 0509 11/07/13 1015  HGB 8.5* 8.2*    Recent Labs  11/06/13 0509 11/07/13 1015  WBC 10.2 9.9  RBC 2.70* 2.62*  HCT 24.2* 23.5*  PLT 414* 358    Recent Labs  11/07/13 1015 11/08/13 0800  NA 130* 132*  K 3.7 3.8  CL 94* 95*  CO2 24 25  BUN 45* 48*  CREATININE 4.66* 4.62*  GLUCOSE 324* 188*  CALCIUM 8.4 8.7   No results found for this basename: LABPT, INR,  in the last 72 hours  Serosanguinous drainage on dressing over lateral portion of wound.  No expressible drainage.  Overall, looks good without purulent drainage or signifcant redness.  Tissues still swollen/puffy  Assessment/Plan: 1.  He may d/c anytime from ortho POV. 2.  Dr. Berenice Primas to re-assume care tomorrow. 3.  Begin daily dressing changes by nursing--dry gauze, loose Kerlix, and loose Ace (Not too snug for sake of vascular graft).   Carigan Lister A. 11/08/2013, 11:03 AM

## 2013-11-09 ENCOUNTER — Encounter (HOSPITAL_COMMUNITY): Payer: Self-pay | Admitting: Orthopedic Surgery

## 2013-11-09 LAB — BASIC METABOLIC PANEL
BUN: 53 mg/dL — ABNORMAL HIGH (ref 6–23)
CO2: 25 mEq/L (ref 19–32)
Chloride: 96 mEq/L (ref 96–112)
Creatinine, Ser: 4.58 mg/dL — ABNORMAL HIGH (ref 0.50–1.35)
GFR calc Af Amer: 15 mL/min — ABNORMAL LOW (ref >90)
Potassium: 3.8 mEq/L (ref 3.5–5.1)

## 2013-11-09 LAB — GLUCOSE, CAPILLARY

## 2013-11-09 MED ORDER — METOPROLOL SUCCINATE ER 50 MG PO TB24: 150.0000 mg | ORAL_TABLET | Freq: Every day | ORAL | Status: DC

## 2013-11-09 MED ORDER — AMLODIPINE BESYLATE 10 MG PO TABS: 10.0000 mg | ORAL_TABLET | Freq: Every day | ORAL | Status: DC

## 2013-11-09 MED ORDER — AMOXICILLIN-POT CLAVULANATE 500-125 MG PO TABS: 1.0000 | ORAL_TABLET | Freq: Two times a day (BID) | ORAL | Status: DC

## 2013-11-09 MED ORDER — FUROSEMIDE 80 MG PO TABS: 80.0000 mg | ORAL_TABLET | Freq: Two times a day (BID) | ORAL | Status: DC

## 2013-11-09 MED ORDER — HYDROCODONE-ACETAMINOPHEN 5-325 MG PO TABS: 1.0000 | ORAL_TABLET | ORAL | Status: DC | PRN

## 2013-11-09 MED ORDER — HYDRALAZINE HCL 100 MG PO TABS: 100.0000 mg | ORAL_TABLET | Freq: Three times a day (TID) | ORAL | Status: DC

## 2013-11-09 NOTE — Progress Notes (Addendum)
Have paged several Drs about d/cing pt .Dr Justin Mend, [place] Now trying to page attending Physician Dr Dillard Essex stated she was coming to see pt

## 2013-11-09 NOTE — Discharge Summary (Signed)
Physician Discharge Summary  Alexander [name] [id number] DOB: [birth year] DOA: 10/21/2013  PCP: No primary provider on file.  Admit date: 10/21/2013 Discharge date: 11/09/2013  Time spent: >30 minutes  Recommendations for Outpatient Follow-up:  Follow-up Information   Follow up with Alexander L, MD. Schedule an appointment as soon as possible for a visit in 2 weeks.   Specialty:  Orthopedic Surgery   Contact information:   [place] [address] [phone]       Follow up with Alexander S, MD In 2 weeks. (sent message to office)    Specialty:  Vascular Surgery   Contact information:   [address] [place] [address] [phone]       Follow up with [place]     On 11/20/2013.   Contact information:   [place] [address] [phone]      Please follow up. (Renal/Kidney as directed [id number])        Discharge Diagnoses:  Principal Problem:   Osteomyelitis of left foot Active Problems:   DIABETES MELLITUS, TYPE II   HYPERTENSION   Leukocytosis   Anemia   PAD (peripheral artery disease)   Acute renal failure   Volume overload   Discharge Condition: Improved/stable  Diet recommendation: Modified carbohydrate  Filed Weights   11/07/13 0550 11/08/13 0500 11/09/13 0500  Weight: 110.36 kg (243 lb 4.8 oz) 112.038 kg (247 lb) 112.946 kg (249 lb)    History of present illness:  52 year old male with past medical history of diabetes, hypertension, left 5th toe amputation for osteomyelitis in 08/2013 who presented to [facility] ED 10/21/2013 with concerns of worsening left 4th doe discoloration and drainage from the wound. Pt was seen in wound care center and was referred to ED for further evaluation and management.  In ED, BP was 196/77, HR 91, RR 20 and Tmax 99.5 F, O2 saturation was 96% on room air. CBC revealed WBC count of 12.5, hemoglobin of 10.4 and normal platelet count. BMP revealed  sodium of 116 and normal creatinine. X ray of the left foot showed findings consistent with osteomyelitis of the left 4th toe with pverlying large soft tissue wound and locules of gas in the soft tissues between the 3rd and 4th MTP joints which could be due to abscess. Noted also was amputation of the 5th metatarsal at the level of the proximal diaphysis. Ortho consulted by an ED physician.   Hospital Course:  Osteomyelitis of left foot  As discussed above, Patient approximately one month status post amputation of fifth ray but since operation his wound has not healed well and patient presented with dusky fourth toe and open and draining foul-smelling wound. Was found to have osteomyelitis. Orthopedic surgeon and vascular surgery consulted and written consent transfer from [facility] to [facility] Upon transfer to [facility], the patient was seen by Orthopedic surgery and vascular surgery>> sp [name] common femoral to tibial peroneal trunk bypass, intraoperative arteriogram on 12/16, with amputation of L4th toe and debridement of lateral foot and on 12/24 transmetatarsal amputation per Dr. Berenice Primas. -Patient was initially placed on broad spectrum IV antibiotics with vancomycin and Zosyn and subsequently the vancomycin was at [facility] per renal out because of his worsening renal function and patient was maintained on Zosyn -On followup today I discussed patient with infectious disease regarding further antibiotics and [name]/p amputation and Dr Linus Salmons at this time recommends antibiotics- Augmentin(renally dosed) for 2 weeks. He is to follow  up outpatient with orthopedics and PCP at [facility] and pending clinical course may be referred to ID outpatient as appropriate. DIABETES MELLITUS, TYPE II  - Patient was placed on sliding scale while in the hospital, his metformin/Januvia was discontinued due to his worsened renal function, has been instructed to resume his Amaryl and his to followup with her PCP for further  monitoring of his blood sugars and adjustment of his medications as clinically appropriate. HYPERTENSION  -Patient'[name] medications were adjusted-the ACE inhibitor was discontinued due to his renal failure, and he was started on Norvasc which is to continue on discharge, his metoprolol was changed out and 50 mg daily and he is also to continue hydralazine 100 3 times a day and followup with PCP outpatient. - continue metoprolol XL to 150mg  daily. Case manager was asked to  assist with his medications and also he was set up with her PCP outpatient. Hyponatremia  - Most likely due to volume overload. Sodium levels improved with diuresis.  His sodium today prior to discharge is 135. He is to continue Lasix upon discharge.  Leukocytosis  - secondary to left foot osteomyelitis  -Leukocytosis resolved with antibiotics, his white cell count prior to discharge today is 9.9. Anemia  - normocytic  - Has remained stable thus far  - Cont to monitor  ARF  Post-operatively, the patient was noted to have acute renal failure, likely secondary to contrast nephropathy, ACE inhibitor, and/or vancomycin toxicity. He was initially hydrated with IV fluids and renal followed and IVF DC'ed as pt got volume overloaded and he was diuresed with Lasix. ACE inhibitor and Vancomycin was DC'd per renal as above. -His Augmentin upon discharge today was renally dosed. - Creatinine peaked at 5.23 on 12/26, creatinine today prior to discharge is 4.58 - Continue Holding ACEI and other nephrotoxic drugs until renal function improves  -As patient renal and from their standpoint okay for discharge and Dr. Posey Pronto stated on 12/28 that he was set patient up for an appointment outpatient with renal. Volume overload  Responding to well todiuretics.     Consultants:  Orthopaedic surgery  Vascular surgery  nephrology Procedures:  ABI  MRI of left foot  Arteriogram 10/27/13  [name] common femoral to tibial peroneal trunk bypass 10/27/13   Amputation of L4th toe and debridement of lateral foot 10/27/13  Status post left foot transmetatarsal amputation good wound closure 11/04/2013 per Dr. Berenice Primas   Discharge Exam: Filed Vitals:   11/09/13 1107  BP: 153/63  Pulse:   Temp:   Resp:      Discharge Instructions  Discharge Orders   Future Appointments Provider Department Dept Phone   11/20/2013 2:00 PM [facility] Covering Provider [place] [phone]   11/25/2013 1:15 PM Angelia Mould, MD [facility] [phone]   Future Orders Complete By Expires   Diet Carb Modified  As directed    Increase activity slowly  As directed        Medication List    STOP taking these medications       lisinopril 20 MG tablet  Commonly known as:  PRINIVIL,ZESTRIL     sitaGLIPtin-metformin 50-1000 MG per tablet  Commonly known as:  JANUMET      TAKE these medications       acetaminophen-codeine 300-30 MG per tablet  Commonly known as:  TYLENOL #3  Take 2 tablets by mouth every 4 (four) hours as needed for moderate pain.  amLODipine 10 MG tablet  Commonly known as:  NORVASC  Take 1 tablet (10 mg total) by mouth daily.     amoxicillin-clavulanate 500-125 MG per tablet  Commonly known as:  AUGMENTIN  Take 1 tablet (500 mg total) by mouth every 12 (twelve) hours.     aspirin EC 81 MG tablet  Take 81 mg by mouth daily.     furosemide 80 MG tablet  Commonly known as:  LASIX  Take 1 tablet (80 mg total) by mouth 2 (two) times daily.     glimepiride 4 MG tablet  Commonly known as:  AMARYL  Take 4 mg by mouth daily before breakfast.     hydrALAZINE 100 MG tablet  Commonly known as:  APRESOLINE  Take 1 tablet (100 mg total) by mouth every 8 (eight) hours.     HYDROcodone-acetaminophen 5-325 MG per tablet  Commonly known as:  NORCO/VICODIN  Take 1-2 tablets by mouth every 4 (four) hours as needed for moderate pain.     metoprolol succinate 50 MG 24  hr tablet  Commonly known as:  TOPROL-XL  Take 3 tablets (150 mg total) by mouth daily.     multivitamins ther. w/minerals Tabs tablet  Take 1 tablet by mouth daily. Men'[name] One a Day       Allergies  Allergen Reactions  . Sulfa Antibiotics Rash       Follow-up Information   Follow up with Alexander L, MD. Schedule an appointment as soon as possible for a visit in 2 weeks.   Specialty:  Orthopedic Surgery   Contact information:   [place] [address] [phone]       Follow up with Alexander S, MD In 2 weeks. (sent message to office)    Specialty:  Vascular Surgery   Contact information:   [address] [place] [address] [phone]       Follow up with [place]     On 11/20/2013.   Contact information:   [place] [address] [phone]      Please follow up. (Renal/Kidney as directed [id number])        The results of significant diagnostics from this hospitalization (including imaging, microbiology, ancillary and laboratory) are listed below for reference.    Significant Diagnostic Studies: Ir Aortagram Abdominal Serialogram  10/23/2013   CLINICAL DATA:  Left foot gangrene and osteomyelitis. Nonhealing of wound with clinical evidence of peripheral vascular disease.  EXAM: 1. ULTRASOUND GUIDANCE FOR VASCULAR ACCESS OF THE RIGHT COMMON FEMORAL ARTERY 2. ABDOMINAL AORTOGRAPHY 3. BILATERAL LOWER EXTREMITY ARTERIOGRAPHY  MEDICATIONS: Sedation:  4 mg IV Versed, 200 mcg IV fentanyl.  Total Moderate Sedation Time:  40 minutes.  CONTRAST:  124mL VISIPAQUE IODIXANOL 320 MG/ML IV SOLN  FLUOROSCOPY TIME:  6 min and 30 seconds.  PROCEDURE: The right groin was sterilely prepped with Betadine and draped. Local anesthesia was provided with 1% lidocaine. Ultrasound was used to confirm patency of the right common femoral artery.  The right common femoral artery was accessed utilizing a micropuncture  set and ultrasound guidance. A guidewire was advanced into the abdominal aorta. A 5 French vascular sheath was placed. A 5 French pigtail catheter was advanced into the abdominal aorta. Abdominal aortogram was performed. This was followed by retraction of the pigtail catheter into the distal aorta. Runoff arteriography was then performed including bilateral oblique arteriography of the pelvis and frontal arteriography of lower extremity runoff into both feet.  After the procedure, the catheter was removed and the sheath removed. Hemostasis was obtained with manual compression and use of a V-Pad.  Complications: None  FINDINGS: Aorta: Normally patent. Bilateral single renal arteries are widely patent.  Left lower extremity: Iliac inflow shows a smoothly tapered stenosis of the mid common iliac artery with maximal 50% arterial stenosis. The internal and external iliac arteries show mild disease without significant stenosis. The common femoral artery is normally patent. The femoral bifurcation is normally patent and shows no significant disease. Scattered plaque is identified in the superficial femoral artery without significant stenosis identified. Significant plaque is present just distal to the adductor hiatus at the level of the SFA/popliteal junction and in the proximal popliteal artery. Just above the femoral condyles, segmental high grade stenosis with near subtotal occlusion of the popliteal artery is identified. The popliteal artery also shows approximately 50% narrowing in its mid segment across the knee joint. The posterior tibial artery is continuously patent into the foot. The anterior tibial artery is open and shows slower flow. In the distal calf, nearly at the ankle, stenosis of the anterior tibial artery is seen approaching 80% narrowing without complete occlusion. The peroneal artery is only visualized in the proximal calf and becomes occluded.  Right lower extremity: No significant disease of the  common, internal or external iliac arteries. The common femoral artery shows mild disease without significant stenosis. The femoral bifurcation shows normal patency. Moderate atherosclerotic plaque is identified in the distal SFA with several areas of narrowing approaching 50- 60%. Smoothly tapered narrowing of the proximal popliteal artery above the knee approaches 50%. Stenosis of the mid popliteal segment also approaches 50% narrowing. Continuously patent anterior tibial and posterior tibial runoff is identified into the foot. The peroneal artery is very small in caliber and open into the distal calf.  IMPRESSION: 1. Severe stenosis with subtotal occlusion of the proximal left popliteal artery near its junction with the SFA. Continuous posterior tibial artery runoff is present below the knee. The anterior tibial artery is open and stenotic in the distal calf. 50% stenosis of the mid left common iliac artery identified. 2. Distal right SFA disease with maximal narrowing approaching 60%. Proximal and mid popliteal artery on the right shows areas of 50% narrowing. Patent anterior tibial and posterior tibial runoff is identified in the lower right leg.   Electronically Signed   By: Aletta Edouard M.D.   On: 10/23/2013 17:53   Ir Angiogram Extremity Bilateral  10/23/2013   CLINICAL DATA:  Left foot gangrene and osteomyelitis. Nonhealing of wound with clinical evidence of peripheral vascular disease.  EXAM: 1. ULTRASOUND GUIDANCE FOR VASCULAR ACCESS OF THE RIGHT COMMON FEMORAL ARTERY 2. ABDOMINAL AORTOGRAPHY 3. BILATERAL LOWER EXTREMITY ARTERIOGRAPHY  MEDICATIONS: Sedation:  4 mg IV Versed, 200 mcg IV fentanyl.  Total Moderate Sedation Time:  40 minutes.  CONTRAST:  120mL VISIPAQUE IODIXANOL 320 MG/ML IV SOLN  FLUOROSCOPY TIME:  6 min and 30 seconds.  PROCEDURE: The right groin was sterilely prepped with Betadine and draped. Local anesthesia was provided with 1% lidocaine. Ultrasound was used to confirm patency of  the right common femoral artery.  The right common femoral artery was accessed utilizing a micropuncture set and ultrasound guidance. A guidewire was advanced into the abdominal aorta. A 5 French vascular sheath was placed. A 5 French pigtail catheter was advanced into the abdominal aorta. Abdominal aortogram was performed. This was followed by retraction of the pigtail catheter into the distal aorta. Runoff arteriography  was then performed including bilateral oblique arteriography of the pelvis and frontal arteriography of lower extremity runoff into both feet.  After the procedure, the catheter was removed and the sheath removed. Hemostasis was obtained with manual compression and use of a V-Pad.  Complications: None  FINDINGS: Aorta: Normally patent. Bilateral single renal arteries are widely patent.  Left lower extremity: Iliac inflow shows a smoothly tapered stenosis of the mid common iliac artery with maximal 50% arterial stenosis. The internal and external iliac arteries show mild disease without significant stenosis. The common femoral artery is normally patent. The femoral bifurcation is normally patent and shows no significant disease. Scattered plaque is identified in the superficial femoral artery without significant stenosis identified. Significant plaque is present just distal to the adductor hiatus at the level of the SFA/popliteal junction and in the proximal popliteal artery. Just above the femoral condyles, segmental high grade stenosis with near subtotal occlusion of the popliteal artery is identified. The popliteal artery also shows approximately 50% narrowing in its mid segment across the knee joint. The posterior tibial artery is continuously patent into the foot. The anterior tibial artery is open and shows slower flow. In the distal calf, nearly at the ankle, stenosis of the anterior tibial artery is seen approaching 80% narrowing without complete occlusion. The peroneal artery is only  visualized in the proximal calf and becomes occluded.  Right lower extremity: No significant disease of the common, internal or external iliac arteries. The common femoral artery shows mild disease without significant stenosis. The femoral bifurcation shows normal patency. Moderate atherosclerotic plaque is identified in the distal SFA with several areas of narrowing approaching 50- 60%. Smoothly tapered narrowing of the proximal popliteal artery above the knee approaches 50%. Stenosis of the mid popliteal segment also approaches 50% narrowing. Continuously patent anterior tibial and posterior tibial runoff is identified into the foot. The peroneal artery is very small in caliber and open into the distal calf.  IMPRESSION: 1. Severe stenosis with subtotal occlusion of the proximal left popliteal artery near its junction with the SFA. Continuous posterior tibial artery runoff is present below the knee. The anterior tibial artery is open and stenotic in the distal calf. 50% stenosis of the mid left common iliac artery identified. 2. Distal right SFA disease with maximal narrowing approaching 60%. Proximal and mid popliteal artery on the right shows areas of 50% narrowing. Patent anterior tibial and posterior tibial runoff is identified in the lower right leg.   Electronically Signed   By: Aletta Edouard M.D.   On: 10/23/2013 17:53   Mr Foot Left Wo Contrast  10/22/2013   CLINICAL DATA:  Soft tissue wound of the lateral aspect of the left foot.  EXAM: MRI OF THE LEFT FOREFOOT WITHOUT CONTRAST  TECHNIQUE: Multiplanar, multisequence MR imaging was performed. No intravenous contrast was administered.  COMPARISON:  Radiographs dated 10/21/2013  FINDINGS: The areas osteomyelitis of the head of the 4th metatarsal and of the base of the proximal phalangeal bone of the 4th toe with bone destruction as indicated on the radiographs. There is gas in the soft tissues dorsal to the head of 4th metatarsal. There is no effusion  in the 4th metatarsal phalangeal joint.  There is also edema in the base of the 5th metatarsal with osseous irregularity of the stump of the bone. This could be reactive due to the surgery done in October but given the adjacent osteomyelitis the possibly of osteomyelitis of the stump of the 5th metatarsal should  be considered. There appears to be a soft tissue ulceration over that area as well.  There are no definable abscesses.  No other acute abnormality of the bones of the forefoot.  IMPRESSION: 1. Osteomyelitis of the head of the 4th metatarsal and of the base of the proximal phalanx of the 4th toe. 2. Osteomyelitis of the stump of the 5th metatarsal.   Electronically Signed   By: Rozetta Nunnery M.D.   On: 10/22/2013 15:15   Ir US Guide Vasc Access Right  10/23/2013   CLINICAL DATA:  Left foot gangrene and osteomyelitis. Nonhealing of wound with clinical evidence of peripheral vascular disease.  EXAM: 1. ULTRASOUND GUIDANCE FOR VASCULAR ACCESS OF THE RIGHT COMMON FEMORAL ARTERY 2. ABDOMINAL AORTOGRAPHY 3. BILATERAL LOWER EXTREMITY ARTERIOGRAPHY  MEDICATIONS: Sedation:  4 mg IV Versed, 200 mcg IV fentanyl.  Total Moderate Sedation Time:  40 minutes.  CONTRAST:  145mL VISIPAQUE IODIXANOL 320 MG/ML IV SOLN  FLUOROSCOPY TIME:  6 min and 30 seconds.  PROCEDURE: The right groin was sterilely prepped with Betadine and draped. Local anesthesia was provided with 1% lidocaine. Ultrasound was used to confirm patency of the right common femoral artery.  The right common femoral artery was accessed utilizing a micropuncture set and ultrasound guidance. A guidewire was advanced into the abdominal aorta. A 5 French vascular sheath was placed. A 5 French pigtail catheter was advanced into the abdominal aorta. Abdominal aortogram was performed. This was followed by retraction of the pigtail catheter into the distal aorta. Runoff arteriography was then performed including bilateral oblique arteriography of the pelvis and  frontal arteriography of lower extremity runoff into both feet.  After the procedure, the catheter was removed and the sheath removed. Hemostasis was obtained with manual compression and use of a V-Pad.  Complications: None  FINDINGS: Aorta: Normally patent. Bilateral single renal arteries are widely patent.  Left lower extremity: Iliac inflow shows a smoothly tapered stenosis of the mid common iliac artery with maximal 50% arterial stenosis. The internal and external iliac arteries show mild disease without significant stenosis. The common femoral artery is normally patent. The femoral bifurcation is normally patent and shows no significant disease. Scattered plaque is identified in the superficial femoral artery without significant stenosis identified. Significant plaque is present just distal to the adductor hiatus at the level of the SFA/popliteal junction and in the proximal popliteal artery. Just above the femoral condyles, segmental high grade stenosis with near subtotal occlusion of the popliteal artery is identified. The popliteal artery also shows approximately 50% narrowing in its mid segment across the knee joint. The posterior tibial artery is continuously patent into the foot. The anterior tibial artery is open and shows slower flow. In the distal calf, nearly at the ankle, stenosis of the anterior tibial artery is seen approaching 80% narrowing without complete occlusion. The peroneal artery is only visualized in the proximal calf and becomes occluded.  Right lower extremity: No significant disease of the common, internal or external iliac arteries. The common femoral artery shows mild disease without significant stenosis. The femoral bifurcation shows normal patency. Moderate atherosclerotic plaque is identified in the distal SFA with several areas of narrowing approaching 50- 60%. Smoothly tapered narrowing of the proximal popliteal artery above the knee approaches 50%. Stenosis of the mid popliteal  segment also approaches 50% narrowing. Continuously patent anterior tibial and posterior tibial runoff is identified into the foot. The peroneal artery is very small in caliber and open into the distal calf.  IMPRESSION:  1. Severe stenosis with subtotal occlusion of the proximal left popliteal artery near its junction with the SFA. Continuous posterior tibial artery runoff is present below the knee. The anterior tibial artery is open and stenotic in the distal calf. 50% stenosis of the mid left common iliac artery identified. 2. Distal right SFA disease with maximal narrowing approaching 60%. Proximal and mid popliteal artery on the right shows areas of 50% narrowing. Patent anterior tibial and posterior tibial runoff is identified in the lower right leg.   Electronically Signed   By: Aletta Edouard M.D.   On: 10/23/2013 17:53   Dg Chest Portable 1 View  10/27/2013   CLINICAL DATA:  Rule out foreign object from surgery. Possible chipped tooth during induction.  EXAM: PORTABLE CHEST - 1 VIEW  COMPARISON:  08/17/2013  FINDINGS: Endotracheal tube tip projects approximately 3 cm above the carina. Multiple monitoring leads overlie the upper and mid chest. Lung volumes are mildly diminished with mild central pulmonary vascular congestion. There is oblong opacity in the retrocardiac left lung base. The cardiac silhouette is enlarged. No radiopaque foreign body is identified.  IMPRESSION: 1. No radiopaque foreign body identified in the chest. 2. Endotracheal tube in satisfactory position. 3. Mildly diminished lung volumes with central pulmonary vascular congestion and left basilar opacity, likely atelectasis.   Electronically Signed   By: Logan Bores   On: 10/27/2013 14:00   Dg Ang/ext/uni/or Left  10/27/2013   CLINICAL DATA:  Left lower extremity femoral to tibioperoneal trunk bypass for perform serial occlusive disease with nonhealing wound of left foot with osteomyelitis.  EXAM: LEFT ANG/EXT/UNI/ OR  TECHNIQUE:  Intraoperative image was obtained.  CONTRAST:  See operative report.  COMPARISON:  Arteriography on 10/23/2013.  FLUOROSCOPY TIME:  See operative report.  FINDINGS: Lower leg image obtained intraoperatively shows the distal and of a saphenous vein graft with anastomosis to the tibioperoneal trunk below the knee. Injected contrast opacified is the native anterior tibial artery and posterior tibial artery with flow identified to the level of the ankle. The peroneal artery is open but demonstrates significant segments of stenosis and disease.  IMPRESSION: Open distal bypass graft with anastomosis to the tibioperoneal trunk below the knee. Anterior tibial and posterior tibial runoff is visualized. The peroneal artery is diffusely diseased.   Electronically Signed   By: Aletta Edouard M.D.   On: 10/27/2013 14:05   Dg Foot 2 Views Left  10/21/2013   CLINICAL DATA:  Diabetic patient with a foot wound.  EXAM: LEFT FOOT - 2 VIEW  COMPARISON:  Plain films 07/22/2013.  FINDINGS: Since the prior study, the patient has undergone amputation of the 5th metatarsal at the level the proximal diaphysis. There is a large soft tissue wound on the lateral margin of the foot centered at the level of the head of the 4th metatarsal. There is bony destructive change in the head of the 4th metatarsal with loss of cortical bone extending into the proximal diaphysis. Bony destructive change is also seen in the periphery of the base of the proximal phalanx of 4th toe. Locules of gas are seen in the soft tissues between the 3rd and 4th MTP joints. Vascular calcifications are noted.  IMPRESSION: Findings consistent with osteomyelitis extending from the distal diaphysis of the 4th metatarsal into the base of the proximal phalanx left 4th toe. Overlying large soft tissue wound is noted.  Locules of gas in the soft tissues between the 3rd and 4th MTP joints could be due to  abscess.  Status post amputation of the 5th metatarsal at the level of  the proximal diaphysis.   Electronically Signed   By: Inge Rise M.D.   On: 10/21/2013 16:24    Microbiology: Recent Results (from the past 240 hour([name]))  SURGICAL PCR SCREEN     Status: None   Collection Time    11/04/13  6:20 AM      Result Value Range Status   MRSA, PCR NEGATIVE  NEGATIVE Final   Staphylococcus aureus NEGATIVE  NEGATIVE Final   Comment:            The Xpert SA Assay (FDA     approved for NASAL specimens     in patients over 50 years of age),     is one component of     a comprehensive surveillance     program.  Test performance has     been validated by [employer] for patients greater     than or equal to 60 year old.     It is not intended     to diagnose infection nor to     guide or monitor treatment.     Labs: Basic Metabolic Panel:  Recent Labs Lab 11/05/13 0550 11/06/13 0509 11/07/13 1015 11/08/13 0800 11/09/13 0900  NA 135 137 130* 132* 135  K 4.1 4.2 3.7 3.8 3.8  CL 96 97 94* 95* 96  CO2 26 25 24 25 25   GLUCOSE 164* 139* 324* 188* 170*  BUN 41* 44* 45* 48* 53*  CREATININE 4.92* 5.23* 4.66* 4.62* 4.58*  CALCIUM 8.7 8.7 8.4 8.7 8.6  PHOS  --  6.7* 5.0* 5.0*  --    Liver Function Tests:  Recent Labs Lab 11/06/13 0509 11/07/13 1015 11/08/13 0800  ALBUMIN 2.4* 2.3* 2.4*   No results found for this basename: LIPASE, AMYLASE,  in the last 168 hours No results found for this basename: AMMONIA,  in the last 168 hours CBC:  Recent Labs Lab 11/03/13 0635 11/04/13 0432 11/05/13 0550 11/06/13 0509 11/07/13 1015  WBC 11.0* 11.6* 12.9* 10.2 9.9  HGB 8.2* 9.0* 8.8* 8.5* 8.2*  HCT 26.0* 26.3* 25.8* 24.2* 23.5*  MCV 89.0 89.8 90.5 89.6 89.7  PLT 457* 475* 444* 414* 358   Cardiac Enzymes: No results found for this basename: CKTOTAL, CKMB, CKMBINDEX, TROPONINI,  in the last 168 hours BNP: BNP (last 3 results) No results found for this basename: PROBNP,  in the last 8760 hours CBG:  Recent Labs Lab 11/08/13 1124  11/08/13 1608 11/08/13 2154 11/09/13 0710 11/09/13 1137  GLUCAP 169* 243* 241* 217* 153*       Signed:  Shaunita Seney C  [facility] 11/09/2013, 3:21 PM

## 2013-11-09 NOTE — Progress Notes (Signed)
Dr Grandville Silos called wanted to make sure pt had follow up abx therapy.Let him talk to Dr Dillard Essex who had came to unit to d/c pt.Pt given RX and d/c instructions talked with Ricki Miller CM about obtaining meds given paperwork By Ricki Miller.Pt d/cd to home per w/c

## 2013-11-09 NOTE — Progress Notes (Signed)
Pharmacy Consult - Zosyn  Day 20 of Zosyn for osteo Afebrile Scr = 4.58  Plan: 1) Continue Zosyn 2.25 grams iv Q 8 hours  2) Pharmacy to sign off  Thank you. Anette Guarneri, PharmD [phone]

## 2013-11-09 NOTE — Care Management Note (Signed)
CARE MANAGEMENT NOTE 11/09/2013  Patient:  Alexander [name]   Account Number:  [id number]  Date Initiated:  11/04/2013  Documentation initiated by:  Ricki Miller  Subjective/Objective Assessment:   52 yr old male admitted with multiple wounds. S/p I & D     Action/Plan:   Patient states she has a friend that she can stay with but feels she needs to go to [employer] care.  SW following also.  CM arranged HHRN with [facility].   Anticipated DC Date:  11/07/2013   Anticipated DC Plan:  [place]  In-house referral  Clinical Social Worker      DC Planning Services  CM consult      [facility] Choice  HOME HEALTH   Choice offered to / List presented to:          [facility] arranged  HH-1 RN      [place].   Status of service:  Completed, signed off Medicare Important Message given?   (If response is "NO", the following Medicare IM given date fields will be blank) Date Medicare IM given:   Date Additional Medicare IM given:    Discharge Disposition:  [place]  Per UR Regulation:    If discussed at Long Length of Stay Meetings, dates discussed:    Comments:  11/09/13 2:27pm Phone number to contact patient: [phone]

## 2013-11-09 NOTE — Care Management Note (Signed)
CARE MANAGEMENT NOTE 11/09/2013  Patient:  Alexander [name], Alexander [name]   Account Number:  [id number]  Date Initiated:  11/04/2013  Documentation initiated by:  Ricki Miller  Subjective/Objective Assessment:   52 yr old male admitted with gangrenous left foot.     Action/Plan:   OR today for transmetatarsal amp.  Wife states he will need assistance with HTN and diabetic meds at discharge. CM will follow.   Anticipated DC Date:  11/09/2013   Anticipated DC Plan:  [place]  CM consult  [place] Program  Follow-up appt scheduled      Choice offered to / List presented to:  C-1 Patient           Status of service:  Completed, signed off Medicare Important Message given?   (If response is "NO", the following Medicare IM given date fields will be blank) Date Medicare IM given:   Date Additional Medicare IM given:    Discharge Disposition:  HOME/SELF CARE  Per UR Regulation:    If discussed at Long Length of Stay Meetings, dates discussed:    Comments:  11/09/13  2:57pm Ricki Miller, RN BSN Case Manager Patient scheduled for appointment at [facility]  for Friday November 20, 2013 at2:00pm

## 2013-11-20 ENCOUNTER — Encounter: Payer: Self-pay | Admitting: Internal Medicine

## 2013-11-20 ENCOUNTER — Ambulatory Visit: Payer: Medicaid Other | Attending: Internal Medicine | Admitting: Internal Medicine

## 2013-11-20 VITALS — BP 156/75 | HR 70 | Temp 98.7°F | Resp 14 | Ht 72.0 in | Wt 240.4 lb

## 2013-11-20 DIAGNOSIS — M9080 Osteopathy in diseases classified elsewhere, unspecified site: Secondary | ICD-10-CM | POA: Insufficient documentation

## 2013-11-20 DIAGNOSIS — E119 Type 2 diabetes mellitus without complications: Secondary | ICD-10-CM

## 2013-11-20 DIAGNOSIS — M869 Osteomyelitis, unspecified: Secondary | ICD-10-CM | POA: Insufficient documentation

## 2013-11-20 DIAGNOSIS — E1169 Type 2 diabetes mellitus with other specified complication: Secondary | ICD-10-CM | POA: Insufficient documentation

## 2013-11-20 LAB — CBC WITH DIFFERENTIAL/PLATELET
BASOS ABS: 0.1 10*3/uL (ref 0.0–0.1)
Basophils Relative: 1 % (ref 0–1)
EOS PCT: 12 % — AB (ref 0–5)
Eosinophils Absolute: 1.1 10*3/uL — ABNORMAL HIGH (ref 0.0–0.7)
HCT: 24.5 % — ABNORMAL LOW (ref 39.0–52.0)
Hemoglobin: 8.5 g/dL — ABNORMAL LOW (ref 13.0–17.0)
LYMPHS PCT: 26 % (ref 12–46)
Lymphs Abs: 2.4 10*3/uL (ref 0.7–4.0)
MCH: 30.2 pg (ref 26.0–34.0)
MCHC: 34.7 g/dL (ref 30.0–36.0)
MCV: 87.2 fL (ref 78.0–100.0)
Monocytes Absolute: 0.7 10*3/uL (ref 0.1–1.0)
Monocytes Relative: 8 % (ref 3–12)
NEUTROS ABS: 4.9 10*3/uL (ref 1.7–7.7)
NEUTROS PCT: 53 % (ref 43–77)
PLATELETS: 336 10*3/uL (ref 150–400)
RBC: 2.81 MIL/uL — ABNORMAL LOW (ref 4.22–5.81)
RDW: 13.6 % (ref 11.5–15.5)
WBC: 9.3 10*3/uL (ref 4.0–10.5)

## 2013-11-20 LAB — LIPID PANEL
CHOLESTEROL: 147 mg/dL (ref 0–200)
HDL: 30 mg/dL — AB (ref >39)
LDL Cholesterol: 85 mg/dL (ref 0–99)
TRIGLYCERIDES: 160 mg/dL — AB (ref <150)
Total CHOL/HDL Ratio: 4.9 Ratio
VLDL: 32 mg/dL (ref 0–40)

## 2013-11-20 LAB — COMPLETE METABOLIC PANEL WITH GFR
ALBUMIN: 3.7 g/dL (ref 3.5–5.2)
ALT: 32 U/L (ref 0–53)
AST: 16 U/L (ref 0–37)
Alkaline Phosphatase: 103 U/L (ref 39–117)
BUN: 45 mg/dL — AB (ref 6–23)
CALCIUM: 9.4 mg/dL (ref 8.4–10.5)
CHLORIDE: 100 meq/L (ref 96–112)
CO2: 31 mEq/L (ref 19–32)
Creat: 2.42 mg/dL — ABNORMAL HIGH (ref 0.50–1.35)
GFR, Est African American: 34 mL/min — ABNORMAL LOW
GFR, Est Non African American: 29 mL/min — ABNORMAL LOW
GLUCOSE: 130 mg/dL — AB (ref 70–99)
POTASSIUM: 4.2 meq/L (ref 3.5–5.3)
Sodium: 138 mEq/L (ref 135–145)
Total Bilirubin: 0.4 mg/dL (ref 0.3–1.2)
Total Protein: 6.8 g/dL (ref 6.0–8.3)

## 2013-11-20 MED ORDER — GLUCOSE BLOOD VI STRP: ORAL_STRIP | Status: DC

## 2013-11-20 MED ORDER — HYDRALAZINE HCL 100 MG PO TABS: 100.0000 mg | ORAL_TABLET | Freq: Three times a day (TID) | ORAL | Status: DC

## 2013-11-20 MED ORDER — AMLODIPINE BESYLATE 10 MG PO TABS
10.0000 mg | ORAL_TABLET | Freq: Every day | ORAL | Status: DC
Start: 2013-11-20 — End: 2014-01-18

## 2013-11-20 MED ORDER — SITAGLIPTIN PHOSPHATE 25 MG PO TABS: 25.0000 mg | ORAL_TABLET | Freq: Every day | ORAL | Status: DC

## 2013-11-20 MED ORDER — METOPROLOL SUCCINATE ER 50 MG PO TB24: 150.0000 mg | ORAL_TABLET | Freq: Every day | ORAL | Status: DC

## 2013-11-20 NOTE — Progress Notes (Signed)
Patient ID: Alexander [name], male   DOB: 1961/08/04, 53 y.o.   MRN: [id number]   CC:  HPI: 53 year old male with past medical history of diabetes, hypertension, left 5th toe amputation for osteomyelitis in 08/2013 who presented to [facility] ED 10/21/2013 with concerns of worsening left 4th doe discoloration and drainage from the wound. Pt was seen in wound care center and was referred to ED for further evaluation and management.  He was admitted from 12/10-12/29. Orthopedic surgeon and vascular surgery consulted  sp L common femoral to tibial peroneal trunk bypass, intraoperative arteriogram on 12/16, with amputation of L4th toe and debridement of lateral foot and on 12/24 transmetatarsal amputation per Dr. Berenice Primas. Patient was initially placed on broad spectrum IV antibiotics with vancomycin and Zosyn and subsequently the vancomycin was at [facility] per renal out because of his worsening renal function and patient was maintained on Zosyn. Subsequently switched to Augmentin, recently dose for 2 more weeks. The following surgeries were performed L common femoral to tibial peroneal trunk bypass 10/27/13 Amputation of L4th toe and debridement of lateral foot 10/27/13 Status post left foot transmetatarsal amputation good wound closure 11/04/2013 per Dr. Berenice Primas  Metformin was discontinued for his diabetes because of renal insufficiency.  He developed acute renal failurlikely secondary to contrast nephropathy, ACE inhibitor, and/or vancomycin toxicity. He was initially hydrated with IV fluids and renal followed and IVF DC'ed as pt got volume overloaded and he was diuresed with Lasix. ACE inhibitor and Vancomycin was DC'd per renal as above. e.Creatinine peaked at 5.23 on 12/26, creatinine today prior to discharge is 4.58. He has an appointment with Dr. Justin Mend this month   Allergies  Allergen Reactions  . Sulfa Antibiotics Rash   Past Medical History  Diagnosis Date  . Hypertension   . Dysrhythmia     h/o pvc's   . Arthritis   . Diabetes mellitus     dx---been  awhile   Current Outpatient Prescriptions on File Prior to Visit  Medication Sig Dispense Refill  . acetaminophen-codeine (TYLENOL #3) 300-30 MG per tablet Take 2 tablets by mouth every 4 (four) hours as needed for moderate pain.      Marland Kitchen amoxicillin-clavulanate (AUGMENTIN) 500-125 MG per tablet Take 1 tablet (500 mg total) by mouth every 12 (twelve) hours.  28 tablet  0  . aspirin EC 81 MG tablet Take 81 mg by mouth daily.      . furosemide (LASIX) 80 MG tablet Take 1 tablet (80 mg total) by mouth 2 (two) times daily.  60 tablet  0  . glimepiride (AMARYL) 4 MG tablet Take 4 mg by mouth daily before breakfast.      . HYDROcodone-acetaminophen (NORCO/VICODIN) 5-325 MG per tablet Take 1-2 tablets by mouth every 4 (four) hours as needed for moderate pain.  30 tablet  0  . Multiple Vitamins-Minerals (MULTIVITAMINS THER. W/MINERALS) TABS Take 1 tablet by mouth daily. Men's One a Day       No current facility-administered medications on file prior to visit.   History reviewed. No pertinent family history. History   Social History  . Marital Status: Married    Spouse Name: N/A    Number of Children: N/A  . Years of Education: N/A   Occupational History  . Not on file.   Social History Main Topics  . Smoking status: Former Smoker -- 1.50 packs/day for 30 years    Types: Cigarettes  . Smokeless tobacco: Never Used  . Alcohol Use: 21.0 oz/week  35 Cans of beer per week     Comment: 10/21/13 - normallly "a bunch" none since 10/18/13  . Drug Use: No  . Sexual Activity: Not on file   Other Topics Concern  . Not on file   Social History Narrative  . No narrative on file    Review of Systems  Constitutional as in history of present illness HENT: Negative for ear pain, nosebleeds, congestion, facial swelling, rhinorrhea, neck pain, neck stiffness and ear discharge.   Eyes: Negative for pain, discharge, redness, itching and visual  disturbance.  Respiratory: Negative for cough, choking, chest tightness, shortness of breath, wheezing and stridor.   Cardiovascular: Negative for chest pain, palpitations and leg swelling.  Gastrointestinal: Negative for abdominal distention.  Genitourinary: Negative for dysuria, urgency, frequency, hematuria, flank pain, decreased urine volume, difficulty urinating and dyspareunia.  Musculoskeletal: Negative for back pain, joint swelling, arthralgias and gait problem.  Neurological: Negative for dizziness, tremors, seizures, syncope, facial asymmetry, speech difficulty, weakness, light-headedness, numbness and headaches.  Hematological: Negative for adenopathy. Does not bruise/bleed easily.  Psychiatric/Behavioral: Negative for hallucinations, behavioral problems, confusion, dysphoric mood, decreased concentration and agitation.    Objective:   Filed Vitals:   11/20/13 1355  BP: 156/75  Pulse: 70  Temp: 98.7 F (37.1 C)  Resp: 14    Physical Exam  Constitutional: Appears well-developed and well-nourished. No distress.  HENT: Normocephalic. External right and left ear normal. Oropharynx is clear and moist.  Eyes: Conjunctivae and EOM are normal. PERRLA, no scleral icterus.  Neck: Normal ROM. Neck supple. No JVD. No tracheal deviation. No thyromegaly.  CVS: RRR, S1/S2 +, no murmurs, no gallops, no carotid bruit.  Pulmonary: Effort and breath sounds normal, no stridor, rhonchi, wheezes, rales.  Abdominal: Soft. BS +,  no distension, tenderness, rebound or guarding.  Musculoskeletal: Left forefoot amputation Lymphadenopathy: No lymphadenopathy noted, cervical, inguinal. Neuro: Alert. Normal reflexes, muscle tone coordination. No cranial nerve deficit. Skin: Skin is warm and dry. No rash noted. Not diaphoretic. No erythema. No pallor.  Psychiatric: Normal mood and affect. Behavior, judgment, thought content normal.   Lab Results  Component Value Date   WBC 9.9 11/07/2013   HGB  8.2* 11/07/2013   HCT 23.5* 11/07/2013   MCV 89.7 11/07/2013   PLT 358 11/07/2013   Lab Results  Component Value Date   CREATININE 4.58* 11/09/2013   BUN 53* 11/09/2013   NA 135 11/09/2013   K 3.8 11/09/2013   CL 96 11/09/2013   CO2 25 11/09/2013    Lab Results  Component Value Date   HGBA1C 6.7* 10/28/2013   Lipid Panel     Component Value Date/Time   CHOL 159 04/06/2009 1332   TRIG 66.0 04/06/2009 1332   HDL 62.80 04/06/2009 1332   CHOLHDL 3 04/06/2009 1332   VLDL 13.2 04/06/2009 1332   LDLCALC 83 04/06/2009 1332       Assessment and plan:   Patient Active Problem List   Diagnosis Date Noted  . Volume overload 10/30/2013  . Acute renal failure 10/28/2013  . PAD (peripheral artery disease) 10/27/2013  . Leukocytosis 10/21/2013  . Anemia 10/21/2013  . Osteomyelitis of left foot 08/19/2013  . Spinal stenosis, lumbar region, with neurogenic claudication 06/12/2012    Class: Diagnosis of  . DIABETES MELLITUS, TYPE II 03/15/2009  . ALCOHOL ABUSE 03/15/2009  . TOBACCO USE 03/15/2009  . HYPERTENSION 03/15/2009       Osteomyelitis of the left foot Good wound healing Daughter doing dressing changes Complete  Augmentin Patient to follow up with Dr. Scot Dock and Dr. Berenice Primas, vascular and orthopedic surgery respectively   Diabetes Continue Amaryl 4 mg patient cautioned about decrease dose with renal insufficiency, he has been elevated in the 250s, Reintroduce  januvia at 25 mg Glucometer strips provided  Hypertension continue current regimen on 3 medications refilled  Acute renal failure Repeat renal panel Follow up with Dr. Justin Mend    The patient was given clear instructions to go to ER or return to medical center if symptoms don't improve, worsen or new problems develop. The patient verbalized understanding. The patient was told to call to get any lab results if not heard anything in the next week.

## 2013-11-20 NOTE — Progress Notes (Signed)
Pt is here to establish care and hospital f/u for diabetes and hypertension. Pt does not have any medication for diabetes. Glucose this morning was 207. Had a symptom of a "seizure", high heart rate and rapid heartbeat, lasted a couple minutes during bedtime last Sunday night; eyes were closed and body felt limp. Pt is currently taking several heart medications. Concerned of side affects for these medications.

## 2013-11-23 ENCOUNTER — Telehealth: Payer: Self-pay | Admitting: *Deleted

## 2013-11-23 NOTE — Telephone Encounter (Signed)
Contacted pt to notify patient of the patient's kidney function is improving. He should followup with his nephrologist Dr. Justin Mend. Hemoglobin is 8.5 but stable. Telephone continued to just ring. Unable to leave a voicemail.

## 2013-11-24 ENCOUNTER — Encounter: Payer: Self-pay | Admitting: Vascular Surgery

## 2013-11-25 ENCOUNTER — Encounter: Payer: Self-pay | Admitting: Vascular Surgery

## 2013-12-09 ENCOUNTER — Other Ambulatory Visit (HOSPITAL_COMMUNITY): Payer: Self-pay | Admitting: *Deleted

## 2013-12-09 ENCOUNTER — Ambulatory Visit: Payer: Medicaid Other | Attending: Internal Medicine

## 2013-12-10 ENCOUNTER — Encounter (HOSPITAL_COMMUNITY)
Admission: RE | Admit: 2013-12-10 | Discharge: 2013-12-10 | Disposition: A | Discharge status: Home / Self Care | Payer: Medicaid Other | Source: Ambulatory Visit | Attending: Nephrology | Admitting: Nephrology

## 2013-12-10 DIAGNOSIS — D638 Anemia in other chronic diseases classified elsewhere: Secondary | ICD-10-CM | POA: Insufficient documentation

## 2013-12-10 DIAGNOSIS — N183 Chronic kidney disease, stage 3 unspecified: Secondary | ICD-10-CM | POA: Insufficient documentation

## 2013-12-10 LAB — POCT HEMOGLOBIN-HEMACUE: Hemoglobin: 9.5 g/dL — ABNORMAL LOW (ref 13.0–17.0)

## 2013-12-10 MED ORDER — DARBEPOETIN ALFA-POLYSORBATE 60 MCG/0.3ML IJ SOLN: 60.0000 ug | INTRAMUSCULAR | Status: DC

## 2013-12-10 MED ORDER — SITAGLIPTIN PHOSPHATE 25 MG PO TABS: 25.0000 mg | ORAL_TABLET | Freq: Every day | ORAL | Status: DC

## 2013-12-10 MED ORDER — GLIPIZIDE 5 MG PO TABS: 5.0000 mg | ORAL_TABLET | Freq: Two times a day (BID) | ORAL | Status: DC

## 2013-12-10 MED ORDER — DARBEPOETIN ALFA-POLYSORBATE 60 MCG/0.3ML IJ SOLN
INTRAMUSCULAR | Status: AC
  Administered 2013-12-10: 60 ug via SUBCUTANEOUS
  Filled 2013-12-10: qty 0.3

## 2013-12-10 NOTE — Progress Notes (Signed)
Called Dr. Justin Mend regarding bp. Orders received to give Clonidine. Took bp reading again prior to giving. Within parameters. Not giving Clonidine. Called [facility] back and informed Emm[place].

## 2013-12-11 ENCOUNTER — Other Ambulatory Visit: Payer: Self-pay | Admitting: Internal Medicine

## 2013-12-11 ENCOUNTER — Telehealth: Payer: Self-pay | Admitting: Internal Medicine

## 2013-12-11 DIAGNOSIS — H538 Other visual disturbances: Secondary | ICD-10-CM

## 2013-12-11 MED ORDER — SITAGLIPTIN PHOSPHATE 25 MG PO TABS: 25.0000 mg | ORAL_TABLET | Freq: Every day | ORAL | Status: DC

## 2013-12-11 NOTE — Telephone Encounter (Signed)
Spoke with pt wife Alexander [name] in regards to husband referral for worsening blurry vision. Pt currently has no insurance or enrolled in discount program. Pt was given Diane number to schedule appt.  Referral placed optometry

## 2013-12-11 NOTE — Telephone Encounter (Signed)
Pt's wife came in today to see if she could request an optometry referral for the pt; pt is concerned his vision is getting worse because of his diabetes; please f/u with pt to discuss this referral

## 2013-12-15 ENCOUNTER — Encounter: Payer: Self-pay | Admitting: Vascular Surgery

## 2013-12-16 ENCOUNTER — Ambulatory Visit (INDEPENDENT_AMBULATORY_CARE_PROVIDER_SITE_OTHER): Payer: MEDICAID | Admitting: Vascular Surgery

## 2013-12-16 ENCOUNTER — Encounter: Payer: Self-pay | Admitting: Vascular Surgery

## 2013-12-16 VITALS — BP 167/58 | HR 73 | Temp 97.9°F | Ht 72.0 in | Wt 235.4 lb

## 2013-12-16 DIAGNOSIS — I70269 Atherosclerosis of native arteries of extremities with gangrene, unspecified extremity: Secondary | ICD-10-CM | POA: Insufficient documentation

## 2013-12-16 NOTE — Addendum Note (Signed)
Addended by: Mena Goes on: 12/16/2013 05:34 PM   Modules accepted: Orders

## 2013-12-16 NOTE — Progress Notes (Signed)
   Patient name: Alexander [name] MRN: [id number] DOB: [birth year] Sex: male  REASON FOR VISIT: Follow up after left femoral-tibial bypass  HPI: Alexander [name] is a 53 y.o. male who presented with an extensive gangrenous wound of his left foot. He underwent a fifth toe amputation which failed to heal. He underwent an arteriogram which showed multilevel arterial occlusive disease. On 10/21/2013, he underwent a left common femoral artery to tibial peroneal trunk bypass with a vein graft and ray amputation of the left fourth toe. He ultimately had a transmetatarsal amputation on the left. He comes in for a routine follow up visit. [place] been doing quite well and has no specific complaints. He quit smoking.  REVIEW OF SYSTEMS: Valu.Nieves ] denotes positive finding; [  ] denotes negative finding  CARDIOVASCULAR:  [ ]  chest pain   [ ]  dyspnea on exertion    CONSTITUTIONAL:  [ ]  fever   [ ]  chills  PHYSICAL EXAM: Filed Vitals:   12/16/13 1458  BP: 167/58  Pulse: 73  Temp: 97.9 F (36.6 C)  TempSrc: Oral  Height: 6' (1.829 m)  Weight: 235 lb 6.4 oz (106.777 kg)  SpO2: 99%   Body mass index is 31.92 kg/(m^2). GENERAL: The patient is a well-nourished male, in no acute distress. The vital signs are documented above. CARDIOVASCULAR: There is a regular rate and rhythm. PULMONARY: There is good air exchange bilaterally without wheezing or rales. His incisions are all healed nicely. He has a brisk biphasic posterior tibial signal on the left with the Doppler.  MEDICAL ISSUES:  Atherosclerosis of native arteries of the extremities with gangrene The patient is doing well status post left femoral-tibial bypass for limb salvage. Dr. Berenice Primas perform his transmetatarsal amputation and this is healing nicely. I'll plan on seeing him back in 3 months with a duplex of his graft and ABIs. We will follow his graft closely. Fortunately he has quit smoking. He knows to call sooner if he has problems.   Return in about 3  months (around 03/15/2014).  [facility] [place] Beeper: [phone]

## 2013-12-16 NOTE — Assessment & Plan Note (Signed)
The patient is doing well status post left femoral-tibial bypass for limb salvage. Dr. Berenice Primas perform his transmetatarsal amputation and this is healing nicely. I'll plan on seeing him back in 3 months with a duplex of his graft and ABIs. We will follow his graft closely. Fortunately he has quit smoking. He knows to call sooner if he has problems.

## 2013-12-17 ENCOUNTER — Encounter (HOSPITAL_COMMUNITY)
Admission: RE | Admit: 2013-12-17 | Discharge: 2013-12-17 | Disposition: A | Discharge status: Home / Self Care | Payer: Medicaid Other | Source: Ambulatory Visit | Attending: Nephrology | Admitting: Nephrology

## 2013-12-17 DIAGNOSIS — N183 Chronic kidney disease, stage 3 unspecified: Secondary | ICD-10-CM | POA: Insufficient documentation

## 2013-12-17 DIAGNOSIS — D638 Anemia in other chronic diseases classified elsewhere: Secondary | ICD-10-CM | POA: Insufficient documentation

## 2013-12-17 LAB — POCT HEMOGLOBIN-HEMACUE: HEMOGLOBIN: 8.6 g/dL — AB (ref 13.0–17.0)

## 2013-12-17 MED ORDER — DARBEPOETIN ALFA-POLYSORBATE 60 MCG/0.3ML IJ SOLN
INTRAMUSCULAR | Status: AC
  Filled 2013-12-17: qty 0.3

## 2013-12-17 MED ORDER — CLONIDINE HCL 0.1 MG PO TABS: 0.3000 mg | ORAL_TABLET | Freq: Once | ORAL | Status: DC

## 2013-12-17 MED ORDER — DARBEPOETIN ALFA-POLYSORBATE 60 MCG/0.3ML IJ SOLN
60.0000 ug | INTRAMUSCULAR | Status: DC
  Administered 2013-12-17: 60 ug via SUBCUTANEOUS

## 2013-12-24 ENCOUNTER — Encounter (HOSPITAL_COMMUNITY)
Admission: RE | Admit: 2013-12-24 | Discharge: 2013-12-24 | Disposition: A | Discharge status: Home / Self Care | Payer: Medicaid Other | Source: Ambulatory Visit | Attending: Nephrology | Admitting: Nephrology

## 2013-12-24 LAB — POCT HEMOGLOBIN-HEMACUE: Hemoglobin: 9.3 g/dL — ABNORMAL LOW (ref 13.0–17.0)

## 2013-12-24 MED ORDER — DARBEPOETIN ALFA-POLYSORBATE 60 MCG/0.3ML IJ SOLN
INTRAMUSCULAR | Status: AC
  Filled 2013-12-24: qty 0.3

## 2013-12-24 MED ORDER — DARBEPOETIN ALFA-POLYSORBATE 60 MCG/0.3ML IJ SOLN
60.0000 ug | INTRAMUSCULAR | Status: DC
  Administered 2013-12-24: 60 ug via SUBCUTANEOUS

## 2013-12-31 ENCOUNTER — Encounter (HOSPITAL_COMMUNITY): Payer: Medicaid Other

## 2014-01-05 DIAGNOSIS — Z0279 Encounter for issue of other medical certificate: Secondary | ICD-10-CM

## 2014-01-06 ENCOUNTER — Encounter (HOSPITAL_COMMUNITY)
Admission: RE | Admit: 2014-01-06 | Discharge: 2014-01-06 | Disposition: A | Discharge status: Home / Self Care | Payer: Medicaid Other | Source: Ambulatory Visit | Attending: Nephrology | Admitting: Nephrology

## 2014-01-06 LAB — POCT HEMOGLOBIN-HEMACUE: HEMOGLOBIN: 10.5 g/dL — AB (ref 13.0–17.0)

## 2014-01-06 LAB — IRON AND TIBC
Iron: 83 ug/dL (ref 42–135)
Saturation Ratios: 33 % (ref 20–55)
TIBC: 248 ug/dL (ref 215–435)
UIBC: 165 ug/dL (ref 125–400)

## 2014-01-06 LAB — FERRITIN: Ferritin: 375 ng/mL — ABNORMAL HIGH (ref 22–322)

## 2014-01-06 LAB — RENAL FUNCTION PANEL
ALBUMIN: 3.4 g/dL — AB (ref 3.5–5.2)
BUN: 32 mg/dL — AB (ref 6–23)
CALCIUM: 8.9 mg/dL (ref 8.4–10.5)
CO2: 20 mEq/L (ref 19–32)
CREATININE: 1.31 mg/dL (ref 0.50–1.35)
Chloride: 106 mEq/L (ref 96–112)
GFR calc Af Amer: 69 mL/min — ABNORMAL LOW (ref >90)
GFR calc non Af Amer: 60 mL/min — ABNORMAL LOW (ref >90)
GLUCOSE: 155 mg/dL — AB (ref 70–99)
PHOSPHORUS: 4 mg/dL (ref 2.3–4.6)
Potassium: 5 mEq/L (ref 3.7–5.3)
Sodium: 138 mEq/L (ref 137–147)

## 2014-01-06 MED ORDER — DARBEPOETIN ALFA-POLYSORBATE 60 MCG/0.3ML IJ SOLN
INTRAMUSCULAR | Status: AC
  Filled 2014-01-06: qty 0.3

## 2014-01-06 MED ORDER — DARBEPOETIN ALFA-POLYSORBATE 60 MCG/0.3ML IJ SOLN
60.0000 ug | INTRAMUSCULAR | Status: DC
  Administered 2014-01-06: 60 ug via SUBCUTANEOUS

## 2014-01-07 ENCOUNTER — Encounter (HOSPITAL_COMMUNITY): Payer: Self-pay

## 2014-01-11 ENCOUNTER — Other Ambulatory Visit: Payer: Self-pay | Admitting: Internal Medicine

## 2014-01-14 ENCOUNTER — Encounter (HOSPITAL_COMMUNITY)
Admission: RE | Admit: 2014-01-14 | Discharge: 2014-01-14 | Disposition: A | Discharge status: Home / Self Care | Payer: Medicaid Other | Source: Ambulatory Visit | Attending: Nephrology | Admitting: Nephrology

## 2014-01-14 DIAGNOSIS — D638 Anemia in other chronic diseases classified elsewhere: Secondary | ICD-10-CM | POA: Insufficient documentation

## 2014-01-14 DIAGNOSIS — N183 Chronic kidney disease, stage 3 unspecified: Secondary | ICD-10-CM | POA: Insufficient documentation

## 2014-01-14 LAB — IRON AND TIBC
Iron: 50 ug/dL (ref 42–135)
SATURATION RATIOS: 21 % (ref 20–55)
TIBC: 233 ug/dL (ref 215–435)
UIBC: 183 ug/dL (ref 125–400)

## 2014-01-14 LAB — FERRITIN: Ferritin: 331 ng/mL — ABNORMAL HIGH (ref 22–322)

## 2014-01-14 LAB — POCT HEMOGLOBIN-HEMACUE: HEMOGLOBIN: 10.8 g/dL — AB (ref 13.0–17.0)

## 2014-01-14 MED ORDER — DARBEPOETIN ALFA-POLYSORBATE 60 MCG/0.3ML IJ SOLN
INTRAMUSCULAR | Status: AC
  Filled 2014-01-14: qty 0.3

## 2014-01-14 MED ORDER — DARBEPOETIN ALFA-POLYSORBATE 60 MCG/0.3ML IJ SOLN
60.0000 ug | INTRAMUSCULAR | Status: DC
  Administered 2014-01-14: 60 ug via SUBCUTANEOUS

## 2014-01-18 ENCOUNTER — Encounter: Payer: Self-pay | Admitting: Internal Medicine

## 2014-01-18 ENCOUNTER — Ambulatory Visit: Payer: Medicaid Other | Attending: Internal Medicine | Admitting: Internal Medicine

## 2014-01-18 VITALS — BP 160/80 | HR 91 | Temp 97.8°F | Resp 16 | Wt 245.6 lb

## 2014-01-18 DIAGNOSIS — I10 Essential (primary) hypertension: Secondary | ICD-10-CM | POA: Insufficient documentation

## 2014-01-18 DIAGNOSIS — E119 Type 2 diabetes mellitus without complications: Secondary | ICD-10-CM | POA: Insufficient documentation

## 2014-01-18 LAB — GLUCOSE, POCT (MANUAL RESULT ENTRY): POC GLUCOSE: 99 mg/dL (ref 70–99)

## 2014-01-18 MED ORDER — AMLODIPINE BESYLATE 10 MG PO TABS: 10.0000 mg | ORAL_TABLET | Freq: Every day | ORAL | Status: DC

## 2014-01-18 MED ORDER — METOPROLOL SUCCINATE ER 50 MG PO TB24: ORAL_TABLET | ORAL | Status: DC

## 2014-01-18 MED ORDER — HYDRALAZINE HCL 100 MG PO TABS: 100.0000 mg | ORAL_TABLET | Freq: Three times a day (TID) | ORAL | Status: DC

## 2014-01-18 NOTE — Progress Notes (Signed)
Patient here for follow up-DM Had his left toes amputated in dec.

## 2014-01-18 NOTE — Progress Notes (Signed)
MRN: [id number] Name: Alexander [name]  Sex: male Age: 53 y.o. DOB: 07-22-1961  Allergies: Sulfa antibiotics  Chief Complaint  Patient presents with  . Follow-up    HPI: Patient is 53 y.o. male who has to of diabetes, hypertension comes today for followup, today's blood pressure is elevated, denies any headache dizziness chest and shortness of breath, patient has not been taking his blood pressure medications as prescribed, I have counseled patient and encouraged to take the medication regularly, for diabetes he is taking Glucotrol and Januvia which was started on the last visit  patient reports improvement in his fasting sugar, today his fingerstick glucose is 99 mg per deciliter. Patient denies any hypoglycemic symptoms.   Past Medical History  Diagnosis Date  . Hypertension   . Dysrhythmia     h/o pvc's  . Arthritis   . Diabetes mellitus     dx---been  awhile    Past Surgical History  Procedure Laterality Date  . Lumbar laminectomy  06/13/2012    Procedure: MICRODISCECTOMY LUMBAR LAMINECTOMY;  Surgeon: Jessy Oto, MD;  Location: [facility];  Service: Orthopedics;  Laterality: N/A;  Central laminectomy L2-3, L3-4, L4-5  . Amputation Left 08/19/2013    Procedure: AMPUTATION RAY;  Surgeon: Alta Corning, MD;  Location: [facility];  Service: Orthopedics;  Laterality: Left;  ray amputation left 5th  . Femoral-tibial bypass graft Left 10/27/2013    Procedure: BYPASS GRAFT LEFT FEMORAL- POSTERIOR TIBIAL ARTERY;  Surgeon: Angelia Mould, MD;  Location: [facility];  Service: Vascular;  Laterality: Left;  . I&d extremity Left 10/27/2013    Procedure: IRRIGATION AND DEBRIDEMENT EXTREMITY- LEFT FOOT;  Surgeon: Angelia Mould, MD;  Location: [facility];  Service: Vascular;  Laterality: Left;  . Amputation Left 10/27/2013    Procedure: AMPUTATION DIGIT-LEFT 4TH TOE, 4th and 5th metatarsal.;  Surgeon: Angelia Mould, MD;  Location: [facility];  Service: Vascular;  Laterality: Left;  .  Amputation Left 11/04/2013    Procedure: LEFT FOOT TRANS-METATARSAL AMPUTATION WITH WOUND CLOSURE ;  Surgeon: Alta Corning, MD;  Location: [facility];  Service: Orthopedics;  Laterality: Left;      Medication List       This list is accurate as of: 01/18/14  2:54 PM.  Always use your most recent med list.               acetaminophen-codeine 300-30 MG per tablet  Commonly known as:  TYLENOL #3  Take 2 tablets by mouth every 4 (four) hours as needed for moderate pain.     amLODipine 10 MG tablet  Commonly known as:  NORVASC  Take 1 tablet (10 mg total) by mouth daily.     amoxicillin-clavulanate 500-125 MG per tablet  Commonly known as:  AUGMENTIN  Take 1 tablet (500 mg total) by mouth every 12 (twelve) hours.     aspirin EC 81 MG tablet  Take 81 mg by mouth daily.     furosemide 80 MG tablet  Commonly known as:  LASIX  Take 1 tablet (80 mg total) by mouth 2 (two) times daily.     glipiZIDE 5 MG tablet  Commonly known as:  GLUCOTROL  Take 1 tablet (5 mg total) by mouth 2 (two) times daily before a meal.     glucose blood test strip  Use as instructed     hydrALAZINE 100 MG tablet  Commonly known as:  APRESOLINE  Take 1 tablet (100 mg total) by mouth  every 8 (eight) hours.     HYDROcodone-acetaminophen 5-325 MG per tablet  Commonly known as:  NORCO/VICODIN  Take 1-2 tablets by mouth every 4 (four) hours as needed for moderate pain.     metoprolol succinate 50 MG 24 hr tablet  Commonly known as:  TOPROL-XL  TAKE 3 TABLETS BY MOUTH ONCE DAILY     multivitamins ther. w/minerals Tabs tablet  Take 1 tablet by mouth daily. Men's One a Day     sitaGLIPtin 25 MG tablet  Commonly known as:  JANUVIA  Take 1 tablet (25 mg total) by mouth daily.        Meds ordered this encounter  Medications  . amLODipine (NORVASC) 10 MG tablet    Sig: Take 1 tablet (10 mg total) by mouth daily.    Dispense:  30 tablet    Refill:  3  . hydrALAZINE (APRESOLINE) 100 MG tablet    Sig:  Take 1 tablet (100 mg total) by mouth every 8 (eight) hours.    Dispense:  90 tablet    Refill:  3  . metoprolol succinate (TOPROL-XL) 50 MG 24 hr tablet    Sig: TAKE 3 TABLETS BY MOUTH ONCE DAILY    Dispense:  90 tablet    Refill:  3    Immunization History  Administered Date(s) Administered  . Influenza,inj,Quad PF,36+ Mos 10/22/2013  . Pneumococcal Polysaccharide-23 10/22/2013    History reviewed. No pertinent family history.  History  Substance Use Topics  . Smoking status: Former Smoker -- 1.50 packs/day for 30 years    Types: Cigarettes  . Smokeless tobacco: Never Used  . Alcohol Use: 21.0 oz/week    35 Cans of beer per week     Comment: 10/21/13 - normallly "a bunch" none since 10/18/13    Review of Systems   As noted in HPI  Filed Vitals:   01/18/14 1450  BP: 160/80  Pulse:   Temp:   Resp:     Physical Exam  Physical Exam  Constitutional: No distress.  Eyes: EOM are normal. Pupils are equal, round, and reactive to light.  Cardiovascular: Normal rate and regular rhythm.   Pulmonary/Chest: Breath sounds normal. No respiratory distress. He has no wheezes. He has no rales.    CBC    Component Value Date/Time   WBC 9.3 11/20/2013 1422   RBC 2.81* 11/20/2013 1422   RBC 3.32* 10/25/2013 1636   HGB 10.8* 01/14/2014 0941   HCT 24.5* 11/20/2013 1422   PLT 336 11/20/2013 1422   MCV 87.2 11/20/2013 1422   LYMPHSABS 2.4 11/20/2013 1422   MONOABS 0.7 11/20/2013 1422   EOSABS 1.1* 11/20/2013 1422   BASOSABS 0.1 11/20/2013 1422    CMP     Component Value Date/Time   NA 138 01/06/2014 1130   K 5.0 01/06/2014 1130   CL 106 01/06/2014 1130   CO2 20 01/06/2014 1130   GLUCOSE 155* 01/06/2014 1130   BUN 32* 01/06/2014 1130   CREATININE 1.31 01/06/2014 1130   CREATININE 2.42* 11/20/2013 1422   CALCIUM 8.9 01/06/2014 1130   PROT 6.8 11/20/2013 1422   ALBUMIN 3.4* 01/06/2014 1130   AST 16 11/20/2013 1422   ALT 32 11/20/2013 1422   ALKPHOS 103 11/20/2013 1422   BILITOT 0.4 11/20/2013 1422    GFRNONAA 60* 01/06/2014 1130   GFRAA 69* 01/06/2014 1130    Lab Results  Component Value Date/Time   CHOL 147 11/20/2013  2:22 PM    No components found with  this basename: hga1c    Lab Results  Component Value Date/Time   AST 16 11/20/2013  2:22 PM    Assessment and Plan  Essential hypertension, benign - Plan: I have advised patient for low salt diet her, continue blood pressure medications  amLODipine (NORVASC) 10 MG tablet, hydrALAZINE (APRESOLINE) 100 MG tablet, metoprolol succinate (TOPROL-XL) 50 MG 24 hr tablet  DM (diabetes mellitus) - Plan: Glucose (CBG) Results for orders placed in visit on 01/18/14  GLUCOSE, POCT (MANUAL RESULT ENTRY)      Result Value Ref Range   POC Glucose 99  70 - 99 mg/dl   Continue Januvia and Glucotrol, will repeat his hemoglobin A1c on the next visit, repeat blood chemistry on the next visit.  Return in about 3 months (around 04/20/2014) for diabetes, hypertension.  Lorayne Marek, MD

## 2014-01-18 NOTE — Patient Instructions (Signed)
DASH Diet  The DASH diet stands for "Dietary Approaches to Stop Hypertension." It is a healthy eating plan that has been shown to reduce high blood pressure (hypertension) in as little as 14 days, while also possibly providing other significant health benefits. These other health benefits include reducing the risk of breast cancer after menopause and reducing the risk of type 2 diabetes, heart disease, colon cancer, and stroke. Health benefits also include weight loss and slowing kidney failure in patients with chronic kidney disease.   DIET GUIDELINES  · Limit salt (sodium). Your diet should contain less than 1500 mg of sodium daily.  · Limit refined or processed carbohydrates. Your diet should include mostly whole grains. Desserts and added sugars should be used sparingly.  · Include small amounts of heart-healthy fats. These types of fats include nuts, oils, and tub margarine. Limit saturated and trans fats. These fats have been shown to be harmful in the body.  CHOOSING FOODS   The following food groups are based on a 2000 calorie diet. See your Registered Dietitian for individual calorie needs.  Grains and Grain Products (6 to 8 servings daily)  · Eat More Often: Whole-wheat bread, brown rice, whole-grain or wheat pasta, quinoa, popcorn without added fat or salt (air popped).  · Eat Less Often: White bread, white pasta, white rice, cornbread.  Vegetables (4 to 5 servings daily)  · Eat More Often: Fresh, frozen, and canned vegetables. Vegetables may be raw, steamed, roasted, or grilled with a minimal amount of fat.  · Eat Less Often/Avoid: Creamed or fried vegetables. Vegetables in a cheese sauce.  Fruit (4 to 5 servings daily)  · Eat More Often: All fresh, canned (in natural juice), or frozen fruits. Dried fruits without added sugar. One hundred percent fruit juice (½ cup [237 mL] daily).  · Eat Less Often: Dried fruits with added sugar. Canned fruit in light or heavy syrup.  Lean Meats, Fish, and Poultry (2  servings or less daily. One serving is 3 to 4 oz [85-114 g]).  · Eat More Often: Ninety percent or leaner ground beef, tenderloin, sirloin. Round cuts of beef, chicken breast, turkey breast. All fish. Grill, bake, or broil your meat. Nothing should be fried.  · Eat Less Often/Avoid: Fatty cuts of meat, turkey, or chicken leg, thigh, or wing. Fried cuts of meat or fish.  Dairy (2 to 3 servings)  · Eat More Often: Low-fat or fat-free milk, low-fat plain or light yogurt, reduced-fat or part-skim cheese.  · Eat Less Often/Avoid: Milk (whole, 2%). Whole milk yogurt. Full-fat cheeses.  Nuts, Seeds, and Legumes (4 to 5 servings per week)  · Eat More Often: All without added salt.  · Eat Less Often/Avoid: Salted nuts and seeds, canned beans with added salt.  Fats and Sweets (limited)  · Eat More Often: Vegetable oils, tub margarines without trans fats, sugar-free gelatin. Mayonnaise and salad dressings.  · Eat Less Often/Avoid: Coconut oils, palm oils, butter, stick margarine, cream, half and half, cookies, candy, pie.  FOR MORE INFORMATION  The Dash Diet Eating Plan: www.dashdiet.org  Document Released: 10/18/2011 Document Revised: 01/21/2012 Document Reviewed: 10/18/2011  [employer]® Patient Information ©2014 [employer].

## 2014-01-21 ENCOUNTER — Encounter (HOSPITAL_COMMUNITY)
Admission: RE | Admit: 2014-01-21 | Discharge: 2014-01-21 | Disposition: A | Discharge status: Home / Self Care | Payer: Medicaid Other | Source: Ambulatory Visit | Attending: Nephrology | Admitting: Nephrology

## 2014-01-21 LAB — POCT HEMOGLOBIN-HEMACUE: HEMOGLOBIN: 11.5 g/dL — AB (ref 13.0–17.0)

## 2014-01-21 MED ORDER — DARBEPOETIN ALFA-POLYSORBATE 60 MCG/0.3ML IJ SOLN
60.0000 ug | INTRAMUSCULAR | Status: DC
  Administered 2014-01-21: 60 ug via SUBCUTANEOUS

## 2014-01-21 MED ORDER — DARBEPOETIN ALFA-POLYSORBATE 60 MCG/0.3ML IJ SOLN
INTRAMUSCULAR | Status: AC
  Filled 2014-01-21: qty 0.3

## 2014-01-28 ENCOUNTER — Encounter (HOSPITAL_COMMUNITY)
Admission: RE | Admit: 2014-01-28 | Discharge: 2014-01-28 | Disposition: A | Discharge status: Home / Self Care | Payer: Medicaid Other | Source: Ambulatory Visit | Attending: Nephrology | Admitting: Nephrology

## 2014-01-28 LAB — POCT HEMOGLOBIN-HEMACUE: Hemoglobin: 11.9 g/dL — ABNORMAL LOW (ref 13.0–17.0)

## 2014-01-28 MED ORDER — DARBEPOETIN ALFA-POLYSORBATE 60 MCG/0.3ML IJ SOLN
INTRAMUSCULAR | Status: AC
  Filled 2014-01-28: qty 0.3

## 2014-01-28 MED ORDER — DARBEPOETIN ALFA-POLYSORBATE 60 MCG/0.3ML IJ SOLN
60.0000 ug | INTRAMUSCULAR | Status: DC
  Administered 2014-01-28: 60 ug via SUBCUTANEOUS

## 2014-02-04 ENCOUNTER — Encounter (HOSPITAL_COMMUNITY)
Admission: RE | Admit: 2014-02-04 | Discharge: 2014-02-04 | Disposition: A | Discharge status: Home / Self Care | Payer: Medicaid Other | Source: Ambulatory Visit | Attending: Nephrology | Admitting: Nephrology

## 2014-02-04 LAB — POCT HEMOGLOBIN-HEMACUE: Hemoglobin: 12.7 g/dL — ABNORMAL LOW (ref 13.0–17.0)

## 2014-02-04 MED ORDER — DARBEPOETIN ALFA-POLYSORBATE 60 MCG/0.3ML IJ SOLN: 60.0000 ug | INTRAMUSCULAR | Status: DC

## 2014-02-08 ENCOUNTER — Other Ambulatory Visit: Payer: Self-pay | Admitting: Internal Medicine

## 2014-02-11 ENCOUNTER — Encounter (HOSPITAL_COMMUNITY)
Admission: RE | Admit: 2014-02-11 | Discharge: 2014-02-11 | Disposition: A | Discharge status: Home / Self Care | Payer: Medicaid Other | Source: Ambulatory Visit | Attending: Nephrology | Admitting: Nephrology

## 2014-02-11 DIAGNOSIS — N183 Chronic kidney disease, stage 3 unspecified: Secondary | ICD-10-CM | POA: Insufficient documentation

## 2014-02-11 DIAGNOSIS — D638 Anemia in other chronic diseases classified elsewhere: Secondary | ICD-10-CM | POA: Insufficient documentation

## 2014-02-11 LAB — IRON AND TIBC
Iron: 52 ug/dL (ref 42–135)
Saturation Ratios: 22 % (ref 20–55)
TIBC: 235 ug/dL (ref 215–435)
UIBC: 183 ug/dL (ref 125–400)

## 2014-02-11 LAB — POCT HEMOGLOBIN-HEMACUE: HEMOGLOBIN: 12.9 g/dL — AB (ref 13.0–17.0)

## 2014-02-11 LAB — FERRITIN: FERRITIN: 256 ng/mL (ref 22–322)

## 2014-02-11 MED ORDER — DARBEPOETIN ALFA-POLYSORBATE 60 MCG/0.3ML IJ SOLN: 60.0000 ug | INTRAMUSCULAR | Status: DC

## 2014-02-25 ENCOUNTER — Encounter (HOSPITAL_COMMUNITY)
Admission: RE | Admit: 2014-02-25 | Discharge: 2014-02-25 | Disposition: A | Discharge status: Home / Self Care | Payer: Medicaid Other | Source: Ambulatory Visit | Attending: Nephrology | Admitting: Nephrology

## 2014-02-25 DIAGNOSIS — D638 Anemia in other chronic diseases classified elsewhere: Secondary | ICD-10-CM | POA: Diagnosis not present

## 2014-02-25 LAB — POCT HEMOGLOBIN-HEMACUE: HEMOGLOBIN: 12.6 g/dL — AB (ref 13.0–17.0)

## 2014-02-25 MED ORDER — DARBEPOETIN ALFA-POLYSORBATE 60 MCG/0.3ML IJ SOLN: 60.0000 ug | INTRAMUSCULAR | Status: DC

## 2014-03-10 ENCOUNTER — Other Ambulatory Visit (HOSPITAL_COMMUNITY): Payer: Self-pay | Admitting: *Deleted

## 2014-03-11 ENCOUNTER — Other Ambulatory Visit: Payer: Self-pay | Admitting: Internal Medicine

## 2014-03-11 ENCOUNTER — Encounter (HOSPITAL_COMMUNITY)
Admission: RE | Admit: 2014-03-11 | Discharge: 2014-03-11 | Disposition: A | Discharge status: Home / Self Care | Payer: Medicaid Other | Source: Ambulatory Visit | Attending: Nephrology | Admitting: Nephrology

## 2014-03-11 DIAGNOSIS — D638 Anemia in other chronic diseases classified elsewhere: Secondary | ICD-10-CM | POA: Diagnosis not present

## 2014-03-11 LAB — IRON AND TIBC
IRON: 104 ug/dL (ref 42–135)
SATURATION RATIOS: 44 % (ref 20–55)
TIBC: 235 ug/dL (ref 215–435)
UIBC: 131 ug/dL (ref 125–400)

## 2014-03-11 LAB — POCT HEMOGLOBIN-HEMACUE: HEMOGLOBIN: 12.1 g/dL — AB (ref 13.0–17.0)

## 2014-03-11 LAB — FERRITIN: Ferritin: 548 ng/mL — ABNORMAL HIGH (ref 22–322)

## 2014-03-11 MED ORDER — DARBEPOETIN ALFA-POLYSORBATE 60 MCG/0.3ML IJ SOLN: 60.0000 ug | INTRAMUSCULAR | Status: DC

## 2014-03-16 ENCOUNTER — Encounter: Payer: Self-pay | Admitting: Vascular Surgery

## 2014-03-17 ENCOUNTER — Ambulatory Visit (INDEPENDENT_AMBULATORY_CARE_PROVIDER_SITE_OTHER)
Admission: RE | Admit: 2014-03-17 | Discharge: 2014-03-17 | Disposition: A | Discharge status: Home / Self Care | Payer: Medicaid Other | Source: Ambulatory Visit | Attending: Vascular Surgery | Admitting: Vascular Surgery

## 2014-03-17 ENCOUNTER — Encounter: Payer: Self-pay | Admitting: Vascular Surgery

## 2014-03-17 ENCOUNTER — Other Ambulatory Visit: Payer: Self-pay | Admitting: Vascular Surgery

## 2014-03-17 ENCOUNTER — Ambulatory Visit (HOSPITAL_COMMUNITY)
Admission: RE | Admit: 2014-03-17 | Discharge: 2014-03-17 | Disposition: A | Discharge status: Home / Self Care | Payer: Medicaid Other | Source: Ambulatory Visit | Attending: Vascular Surgery | Admitting: Vascular Surgery

## 2014-03-17 ENCOUNTER — Ambulatory Visit (INDEPENDENT_AMBULATORY_CARE_PROVIDER_SITE_OTHER): Payer: Medicaid Other | Admitting: Vascular Surgery

## 2014-03-17 VITALS — BP 188/76 | HR 105 | Resp 18 | Ht 72.0 in | Wt 246.0 lb

## 2014-03-17 DIAGNOSIS — Z48812 Encounter for surgical aftercare following surgery on the circulatory system: Secondary | ICD-10-CM | POA: Insufficient documentation

## 2014-03-17 DIAGNOSIS — I739 Peripheral vascular disease, unspecified: Secondary | ICD-10-CM

## 2014-03-17 DIAGNOSIS — I70269 Atherosclerosis of native arteries of extremities with gangrene, unspecified extremity: Secondary | ICD-10-CM | POA: Insufficient documentation

## 2014-03-17 MED ORDER — ATORVASTATIN CALCIUM 10 MG PO TABS: 10.0000 mg | ORAL_TABLET | Freq: Every day | ORAL | Status: DC

## 2014-03-17 NOTE — Progress Notes (Signed)
[facility]  Patient name: Alexander [name] MRN: [id number] DOB: 08-25-1961 Sex: male  REASON FOR VISIT: Follow up of peripheral vascular disease.    [facility] PATIENT  HPI: Alexander [name] is a 53 y.o. male who had presented with extensive gangrene of his left foot. He underwent a fifth toe amputation which failed to heal. He had multilevel arterial occlusive disease. On 10/21/2013, he underwent a left common femoral artery to tibial peroneal trunk bypass with a vein graft in [place] amputation of his left fourth toe. He ultimately required a transmetatarsal amputation on the left (Dr Berenice Primas). He comes in for a routine follow up visit.   Overall he been doing well and has no specific complaints. He denies claudication. His transmetatarsal amputation site has healed. He denies rest pain. He has quit smoking.  Past Medical History  Diagnosis Date  . Hypertension   . Dysrhythmia     h/o pvc's  . Arthritis   . Diabetes mellitus     dx---been  awhile   No family history on file. SOCIAL HISTORY: History  Substance Use Topics  . Smoking status: Former Smoker -- 1.50 packs/day for 30 years    Types: Cigarettes  . Smokeless tobacco: Never Used  . Alcohol Use: 21.0 oz/week    35 Cans of beer per week     Comment: 10/21/13 - normallly "a bunch" none since 10/18/13   Allergies  Allergen Reactions  . Sulfa Antibiotics Rash   Current Outpatient Prescriptions  Medication Sig Dispense Refill  . aspirin EC 81 MG tablet Take 81 mg by mouth daily.      Marland Kitchen glipiZIDE (GLUCOTROL) 5 MG tablet Take 1 tablet (5 mg total) by mouth 2 (two) times daily before a meal.  60 tablet  3  . glucose blood test strip Use as instructed  100 each  12  . hydrALAZINE (APRESOLINE) 100 MG tablet TAKE 1 TABLET BY MOUTH EVERY 8 HOURS  90 tablet  0  . metoprolol succinate (TOPROL-XL) 50 MG 24 hr tablet TAKE 3 TABLETS BY MOUTH ONCE DAILY  90 tablet  3  . metoprolol succinate (TOPROL-XL) 50 MG 24 hr  tablet TAKE 3 TABLETS BY MOUTH ONCE DAILY  90 tablet  0  . Multiple Vitamins-Minerals (MULTIVITAMINS THER. W/MINERALS) TABS Take 1 tablet by mouth daily. Men's One a Day      . sitaGLIPtin (JANUVIA) 25 MG tablet Take 1 tablet (25 mg total) by mouth daily.  90 tablet  3  . acetaminophen-codeine (TYLENOL #3) 300-30 MG per tablet Take 2 tablets by mouth every 4 (four) hours as needed for moderate pain.      Marland Kitchen amLODipine (NORVASC) 10 MG tablet Take 1 tablet (10 mg total) by mouth daily.  30 tablet  3  . amoxicillin-clavulanate (AUGMENTIN) 500-125 MG per tablet Take 1 tablet (500 mg total) by mouth every 12 (twelve) hours.  28 tablet  0  . atorvastatin (LIPITOR) 10 MG tablet Take 1 tablet (10 mg total) by mouth daily.  30 tablet  5  . furosemide (LASIX) 80 MG tablet Take 1 tablet (80 mg total) by mouth 2 (two) times daily.  60 tablet  0  . HYDROcodone-acetaminophen (NORCO/VICODIN) 5-325 MG per tablet Take 1-2 tablets by mouth every 4 (four) hours as needed for moderate pain.  30 tablet  0   No current facility-administered medications for this visit.   REVIEW OF SYSTEMS: Valu.Nieves ] denotes positive finding; [  ] denotes  negative finding  CARDIOVASCULAR:  [ ]  chest pain   [ ]  chest pressure   [ ]  palpitations   [ ]  orthopnea   [ ]  dyspnea on exertion   [ ]  claudication   [ ]  rest pain   [ ]  DVT   [ ]  phlebitis PULMONARY:   [ ]  productive cough   [ ]  asthma   [ ]  wheezing NEUROLOGIC:   [ ]  weakness  [ ]  paresthesias  [ ]  aphasia  [ ]  amaurosis  [ ]  dizziness HEMATOLOGIC:   [ ]  bleeding problems   [ ]  clotting disorders MUSCULOSKELETAL:  [ ]  joint pain   [ ]  joint swelling [ ]  leg swelling GASTROINTESTINAL: [ ]   blood in stool  [ ]   hematemesis GENITOURINARY:  [ ]   dysuria  [ ]   hematuria PSYCHIATRIC:  [ ]  history of major depression INTEGUMENTARY:  [ ]  rashes  [ ]  ulcers CONSTITUTIONAL:  [ ]  fever   [ ]  chills  PHYSICAL EXAM: Filed Vitals:   03/17/14 1203  BP: 188/76  Pulse: 105  Resp: 18  Height:  6' (1.829 m)  Weight: 246 lb (111.585 kg)   Body mass index is 33.36 kg/(m^2). GENERAL: The patient is a well-nourished male, in no acute distress. The vital signs are documented above. CARDIOVASCULAR: There is a regular rate and rhythm. I do not detect carotid bruits. He has palpable femoral pulses. Both feet are warm and well-perfused. PULMONARY: There is good air exchange bilaterally without wheezing or rales. ABDOMEN: Soft and non-tender with normal pitched bowel sounds.  MUSCULOSKELETAL: There are no major deformities or cyanosis. NEUROLOGIC: No focal weakness or paresthesias are detected. SKIN: There are no ulcers or rashes noted. PSYCHIATRIC: The patient has a normal affect.  DATA:  I have independently interpreted his duplex scan of his graft which shows triphasic waveforms throughout the graft. The graft is widely patent. ABI on the left is 100%. He has triphasic waveforms in the dorsalis pedis and posterior tibial positions bilaterally.  MEDICAL ISSUES:  Atherosclerosis of native arteries of the extremities with gangrene The patient's left femoral-tibial bypass graft is patent with normal blood flow in the left foot. His transmetatarsal amputation site is healed. He is on aspirin. He was not on a statin so I have started low dose Lipitor 10 mg daily. I've explained that there is strong evidence that for patient's with peripheral vascular disease, being both on aspirin and a statin will significantly lower his mortality. I've encouraged him to stay as active as possible. We will continue to follow his graft closely. See him back in 6 months. He knows to call sooner if he has problems.    Return in about 6 months (around 09/17/2014).  Angelia Mould [facility]: [phone]

## 2014-03-17 NOTE — Addendum Note (Signed)
Addended by: Mena Goes on: 03/17/2014 02:09 PM   Modules accepted: Orders

## 2014-03-17 NOTE — Assessment & Plan Note (Signed)
The patient's left femoral-tibial bypass graft is patent with normal blood flow in the left foot. His transmetatarsal amputation site is healed. He is on aspirin. He was not on a statin so I have started low dose Lipitor 10 mg daily. I've explained that there is strong evidence that for patient's with peripheral vascular disease, being both on aspirin and a statin will significantly lower his mortality. I've encouraged him to stay as active as possible. We will continue to follow his graft closely. See him back in 6 months. He knows to call sooner if he has problems.

## 2014-03-25 ENCOUNTER — Encounter (HOSPITAL_COMMUNITY)
Admission: RE | Admit: 2014-03-25 | Discharge: 2014-03-25 | Disposition: A | Discharge status: Home / Self Care | Payer: Medicaid Other | Source: Ambulatory Visit | Attending: Nephrology | Admitting: Nephrology

## 2014-03-25 DIAGNOSIS — N183 Chronic kidney disease, stage 3 unspecified: Secondary | ICD-10-CM | POA: Diagnosis not present

## 2014-03-25 DIAGNOSIS — D638 Anemia in other chronic diseases classified elsewhere: Secondary | ICD-10-CM | POA: Insufficient documentation

## 2014-03-25 LAB — POCT HEMOGLOBIN-HEMACUE: HEMOGLOBIN: 11.4 g/dL — AB (ref 13.0–17.0)

## 2014-03-25 MED ORDER — DARBEPOETIN ALFA-POLYSORBATE 60 MCG/0.3ML IJ SOLN
INTRAMUSCULAR | Status: AC
  Filled 2014-03-25: qty 0.3

## 2014-03-25 MED ORDER — DARBEPOETIN ALFA-POLYSORBATE 60 MCG/0.3ML IJ SOLN
60.0000 ug | INTRAMUSCULAR | Status: DC
  Administered 2014-03-25: 60 ug via SUBCUTANEOUS

## 2014-04-01 ENCOUNTER — Encounter (HOSPITAL_COMMUNITY)
Admission: RE | Admit: 2014-04-01 | Discharge: 2014-04-01 | Disposition: A | Discharge status: Home / Self Care | Payer: Medicaid Other | Source: Ambulatory Visit | Attending: Nephrology | Admitting: Nephrology

## 2014-04-01 DIAGNOSIS — D638 Anemia in other chronic diseases classified elsewhere: Secondary | ICD-10-CM | POA: Diagnosis not present

## 2014-04-01 LAB — POCT HEMOGLOBIN-HEMACUE: Hemoglobin: 11.5 g/dL — ABNORMAL LOW (ref 13.0–17.0)

## 2014-04-01 MED ORDER — DARBEPOETIN ALFA-POLYSORBATE 60 MCG/0.3ML IJ SOLN
INTRAMUSCULAR | Status: AC
  Filled 2014-04-01: qty 0.3

## 2014-04-01 MED ORDER — DARBEPOETIN ALFA-POLYSORBATE 60 MCG/0.3ML IJ SOLN
60.0000 ug | INTRAMUSCULAR | Status: DC
  Administered 2014-04-01: 60 ug via SUBCUTANEOUS

## 2014-04-08 ENCOUNTER — Encounter (HOSPITAL_COMMUNITY)
Admission: RE | Admit: 2014-04-08 | Discharge: 2014-04-08 | Disposition: A | Discharge status: Home / Self Care | Payer: Medicaid Other | Source: Ambulatory Visit | Attending: Nephrology | Admitting: Nephrology

## 2014-04-08 DIAGNOSIS — D638 Anemia in other chronic diseases classified elsewhere: Secondary | ICD-10-CM | POA: Diagnosis not present

## 2014-04-08 LAB — IRON AND TIBC
Iron: 63 ug/dL (ref 42–135)
Saturation Ratios: 30 % (ref 20–55)
TIBC: 209 ug/dL — AB (ref 215–435)
UIBC: 146 ug/dL (ref 125–400)

## 2014-04-08 LAB — FERRITIN: FERRITIN: 279 ng/mL (ref 22–322)

## 2014-04-08 LAB — POCT HEMOGLOBIN-HEMACUE: Hemoglobin: 11.7 g/dL — ABNORMAL LOW (ref 13.0–17.0)

## 2014-04-08 MED ORDER — DARBEPOETIN ALFA-POLYSORBATE 60 MCG/0.3ML IJ SOLN
INTRAMUSCULAR | Status: AC
  Filled 2014-04-08: qty 0.3

## 2014-04-08 MED ORDER — DARBEPOETIN ALFA-POLYSORBATE 60 MCG/0.3ML IJ SOLN
60.0000 ug | INTRAMUSCULAR | Status: DC
  Administered 2014-04-08: 60 ug via SUBCUTANEOUS

## 2014-04-09 ENCOUNTER — Other Ambulatory Visit: Payer: Self-pay | Admitting: Internal Medicine

## 2014-04-09 DIAGNOSIS — E119 Type 2 diabetes mellitus without complications: Secondary | ICD-10-CM

## 2014-04-09 DIAGNOSIS — I10 Essential (primary) hypertension: Secondary | ICD-10-CM

## 2014-04-15 ENCOUNTER — Encounter (HOSPITAL_COMMUNITY)
Admission: RE | Admit: 2014-04-15 | Discharge: 2014-04-15 | Disposition: A | Discharge status: Home / Self Care | Payer: Medicaid Other | Source: Ambulatory Visit | Attending: Nephrology | Admitting: Nephrology

## 2014-04-15 DIAGNOSIS — D638 Anemia in other chronic diseases classified elsewhere: Secondary | ICD-10-CM | POA: Insufficient documentation

## 2014-04-15 DIAGNOSIS — N183 Chronic kidney disease, stage 3 unspecified: Secondary | ICD-10-CM | POA: Diagnosis not present

## 2014-04-15 LAB — POCT HEMOGLOBIN-HEMACUE: Hemoglobin: 12.4 g/dL — ABNORMAL LOW (ref 13.0–17.0)

## 2014-04-15 MED ORDER — DARBEPOETIN ALFA-POLYSORBATE 60 MCG/0.3ML IJ SOLN: 60.0000 ug | INTRAMUSCULAR | Status: DC

## 2014-04-20 ENCOUNTER — Ambulatory Visit: Payer: PRIVATE HEALTH INSURANCE | Admitting: Internal Medicine

## 2014-04-22 ENCOUNTER — Encounter (HOSPITAL_COMMUNITY): Payer: Medicaid Other

## 2014-04-29 ENCOUNTER — Encounter (HOSPITAL_COMMUNITY)
Admission: RE | Admit: 2014-04-29 | Discharge: 2014-04-29 | Disposition: A | Discharge status: Home / Self Care | Payer: Medicaid Other | Source: Ambulatory Visit | Attending: Nephrology | Admitting: Nephrology

## 2014-04-29 DIAGNOSIS — D638 Anemia in other chronic diseases classified elsewhere: Secondary | ICD-10-CM | POA: Diagnosis not present

## 2014-04-29 LAB — POCT HEMOGLOBIN-HEMACUE: Hemoglobin: 12 g/dL — ABNORMAL LOW (ref 13.0–17.0)

## 2014-04-29 MED ORDER — DARBEPOETIN ALFA-POLYSORBATE 60 MCG/0.3ML IJ SOLN: 60.0000 ug | INTRAMUSCULAR | Status: DC

## 2014-04-30 ENCOUNTER — Emergency Department (HOSPITAL_COMMUNITY): Payer: Medicaid Other

## 2014-04-30 ENCOUNTER — Encounter (HOSPITAL_COMMUNITY): Payer: Self-pay | Admitting: Emergency Medicine

## 2014-04-30 ENCOUNTER — Observation Stay (HOSPITAL_COMMUNITY)
Admission: EM | Admit: 2014-04-30 | Discharge: 2014-05-01 | Disposition: A | Discharge status: Home / Self Care | Payer: Medicaid Other | Attending: Internal Medicine | Admitting: Internal Medicine

## 2014-04-30 DIAGNOSIS — I739 Peripheral vascular disease, unspecified: Secondary | ICD-10-CM

## 2014-04-30 DIAGNOSIS — I10 Essential (primary) hypertension: Secondary | ICD-10-CM

## 2014-04-30 DIAGNOSIS — I70269 Atherosclerosis of native arteries of extremities with gangrene, unspecified extremity: Secondary | ICD-10-CM

## 2014-04-30 DIAGNOSIS — R55 Syncope and collapse: Principal | ICD-10-CM

## 2014-04-30 DIAGNOSIS — M48062 Spinal stenosis, lumbar region with neurogenic claudication: Secondary | ICD-10-CM

## 2014-04-30 DIAGNOSIS — F17200 Nicotine dependence, unspecified, uncomplicated: Secondary | ICD-10-CM

## 2014-04-30 DIAGNOSIS — E119 Type 2 diabetes mellitus without complications: Secondary | ICD-10-CM

## 2014-04-30 DIAGNOSIS — F1010 Alcohol abuse, uncomplicated: Secondary | ICD-10-CM

## 2014-04-30 DIAGNOSIS — S98919A Complete traumatic amputation of unspecified foot, level unspecified, initial encounter: Secondary | ICD-10-CM | POA: Insufficient documentation

## 2014-04-30 DIAGNOSIS — N186 End stage renal disease: Secondary | ICD-10-CM | POA: Diagnosis present

## 2014-04-30 DIAGNOSIS — R946 Abnormal results of thyroid function studies: Secondary | ICD-10-CM | POA: Insufficient documentation

## 2014-04-30 DIAGNOSIS — I519 Heart disease, unspecified: Secondary | ICD-10-CM | POA: Insufficient documentation

## 2014-04-30 DIAGNOSIS — I160 Hypertensive urgency: Secondary | ICD-10-CM

## 2014-04-30 DIAGNOSIS — D649 Anemia, unspecified: Secondary | ICD-10-CM | POA: Diagnosis present

## 2014-04-30 DIAGNOSIS — M869 Osteomyelitis, unspecified: Secondary | ICD-10-CM

## 2014-04-30 DIAGNOSIS — D72829 Elevated white blood cell count, unspecified: Secondary | ICD-10-CM

## 2014-04-30 DIAGNOSIS — E1122 Type 2 diabetes mellitus with diabetic chronic kidney disease: Secondary | ICD-10-CM | POA: Diagnosis present

## 2014-04-30 DIAGNOSIS — Z7982 Long term (current) use of aspirin: Secondary | ICD-10-CM | POA: Insufficient documentation

## 2014-04-30 LAB — I-STAT TROPONIN, ED: TROPONIN I, POC: 0.02 ng/mL (ref 0.00–0.08)

## 2014-04-30 LAB — BASIC METABOLIC PANEL
BUN: 18 mg/dL (ref 6–23)
CO2: 22 mEq/L (ref 19–32)
CREATININE: 1.13 mg/dL (ref 0.50–1.35)
Calcium: 9 mg/dL (ref 8.4–10.5)
Chloride: 95 mEq/L — ABNORMAL LOW (ref 96–112)
GFR calc non Af Amer: 71 mL/min — ABNORMAL LOW (ref >90)
GFR, EST AFRICAN AMERICAN: 83 mL/min — AB (ref >90)
Glucose, Bld: 80 mg/dL (ref 70–99)
Potassium: 4.5 mEq/L (ref 3.7–5.3)
Sodium: 133 mEq/L — ABNORMAL LOW (ref 137–147)

## 2014-04-30 LAB — CBC
HCT: 34.1 % — ABNORMAL LOW (ref 39.0–52.0)
Hemoglobin: 11.7 g/dL — ABNORMAL LOW (ref 13.0–17.0)
MCH: 29.7 pg (ref 26.0–34.0)
MCHC: 34.3 g/dL (ref 30.0–36.0)
MCV: 86.5 fL (ref 78.0–100.0)
PLATELETS: 209 10*3/uL (ref 150–400)
RBC: 3.94 MIL/uL — ABNORMAL LOW (ref 4.22–5.81)
RDW: 13.8 % (ref 11.5–15.5)
WBC: 7.9 10*3/uL (ref 4.0–10.5)

## 2014-04-30 NOTE — ED Notes (Signed)
Patient with history of "blacking out".  Patient states that he has hypertension and has been getting lightheaded.  Patient denies any chest pain or shortness of breath.
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
## 2014-05-01 ENCOUNTER — Encounter (HOSPITAL_COMMUNITY): Payer: Self-pay | Admitting: Internal Medicine

## 2014-05-01 DIAGNOSIS — E119 Type 2 diabetes mellitus without complications: Secondary | ICD-10-CM

## 2014-05-01 DIAGNOSIS — I517 Cardiomegaly: Secondary | ICD-10-CM

## 2014-05-01 DIAGNOSIS — F17200 Nicotine dependence, unspecified, uncomplicated: Secondary | ICD-10-CM

## 2014-05-01 DIAGNOSIS — I10 Essential (primary) hypertension: Secondary | ICD-10-CM

## 2014-05-01 DIAGNOSIS — F1010 Alcohol abuse, uncomplicated: Secondary | ICD-10-CM

## 2014-05-01 DIAGNOSIS — R55 Syncope and collapse: Principal | ICD-10-CM

## 2014-05-01 LAB — COMPREHENSIVE METABOLIC PANEL
ALK PHOS: 79 U/L (ref 39–117)
ALT: 55 U/L — ABNORMAL HIGH (ref 0–53)
ALT: 57 U/L — ABNORMAL HIGH (ref 0–53)
AST: 35 U/L (ref 0–37)
AST: 39 U/L — AB (ref 0–37)
Albumin: 3.2 g/dL — ABNORMAL LOW (ref 3.5–5.2)
Albumin: 3.3 g/dL — ABNORMAL LOW (ref 3.5–5.2)
Alkaline Phosphatase: 79 U/L (ref 39–117)
BILIRUBIN TOTAL: 0.6 mg/dL (ref 0.3–1.2)
BUN: 19 mg/dL (ref 6–23)
BUN: 20 mg/dL (ref 6–23)
CO2: 19 mEq/L (ref 19–32)
CO2: 21 mEq/L (ref 19–32)
Calcium: 9 mg/dL (ref 8.4–10.5)
Calcium: 9.1 mg/dL (ref 8.4–10.5)
Chloride: 96 mEq/L (ref 96–112)
Chloride: 97 mEq/L (ref 96–112)
Creatinine, Ser: 1.19 mg/dL (ref 0.50–1.35)
Creatinine, Ser: 1.23 mg/dL (ref 0.50–1.35)
GFR calc Af Amer: 78 mL/min — ABNORMAL LOW (ref >90)
GFR calc non Af Amer: 64 mL/min — ABNORMAL LOW (ref >90)
GFR, EST AFRICAN AMERICAN: 75 mL/min — AB (ref >90)
GFR, EST NON AFRICAN AMERICAN: 67 mL/min — AB (ref >90)
GLUCOSE: 103 mg/dL — AB (ref 70–99)
Glucose, Bld: 84 mg/dL (ref 70–99)
Potassium: 4.7 mEq/L (ref 3.7–5.3)
Potassium: 4.9 mEq/L (ref 3.7–5.3)
SODIUM: 134 meq/L — AB (ref 137–147)
Sodium: 134 mEq/L — ABNORMAL LOW (ref 137–147)
Total Bilirubin: 0.7 mg/dL (ref 0.3–1.2)
Total Protein: 6.5 g/dL (ref 6.0–8.3)
Total Protein: 6.8 g/dL (ref 6.0–8.3)

## 2014-05-01 LAB — RAPID URINE DRUG SCREEN, HOSP PERFORMED
Amphetamines: NOT DETECTED
BARBITURATES: NOT DETECTED
Benzodiazepines: NOT DETECTED
Cocaine: NOT DETECTED
Opiates: NOT DETECTED
Tetrahydrocannabinol: NOT DETECTED

## 2014-05-01 LAB — GLUCOSE, CAPILLARY
GLUCOSE-CAPILLARY: 149 mg/dL — AB (ref 70–99)
Glucose-Capillary: 168 mg/dL — ABNORMAL HIGH (ref 70–99)
Glucose-Capillary: 121 mg/dL — ABNORMAL HIGH (ref 70–99)

## 2014-05-01 LAB — URINE MICROSCOPIC-ADD ON

## 2014-05-01 LAB — PROTIME-INR
INR: 1.02 (ref 0.00–1.49)
Prothrombin Time: 13.2 seconds (ref 11.6–15.2)

## 2014-05-01 LAB — URINALYSIS, ROUTINE W REFLEX MICROSCOPIC
BILIRUBIN URINE: NEGATIVE
Glucose, UA: NEGATIVE mg/dL
HGB URINE DIPSTICK: NEGATIVE
KETONES UR: NEGATIVE mg/dL
Leukocytes, UA: NEGATIVE
Nitrite: NEGATIVE
PH: 5.5 (ref 5.0–8.0)
Protein, ur: >300 mg/dL — AB
Specific Gravity, Urine: 1.016 (ref 1.005–1.030)
Urobilinogen, UA: 0.2 mg/dL (ref 0.0–1.0)

## 2014-05-01 LAB — TROPONIN I
Troponin I: <0.3 ng/mL (ref <0.30)
Troponin I: <0.3 ng/mL (ref <0.30)

## 2014-05-01 LAB — CBC
HCT: 32 % — ABNORMAL LOW (ref 39.0–52.0)
Hemoglobin: 10.9 g/dL — ABNORMAL LOW (ref 13.0–17.0)
MCH: 29.6 pg (ref 26.0–34.0)
MCHC: 34.1 g/dL (ref 30.0–36.0)
MCV: 87 fL (ref 78.0–100.0)
Platelets: 192 10*3/uL (ref 150–400)
RBC: 3.68 MIL/uL — ABNORMAL LOW (ref 4.22–5.81)
RDW: 13.8 % (ref 11.5–15.5)
WBC: 7.7 10*3/uL (ref 4.0–10.5)

## 2014-05-01 LAB — HEMOGLOBIN A1C
Hgb A1c MFr Bld: 6 % — ABNORMAL HIGH (ref <5.7)
Mean Plasma Glucose: 126 mg/dL — ABNORMAL HIGH (ref <117)

## 2014-05-01 LAB — T3, FREE: T3 FREE: 2.9 pg/mL (ref 2.3–4.2)

## 2014-05-01 LAB — T4, FREE: Free T4: 1.27 ng/dL (ref 0.80–1.80)

## 2014-05-01 LAB — TSH: TSH: 4.76 u[IU]/mL — ABNORMAL HIGH (ref 0.350–4.500)

## 2014-05-01 LAB — PHOSPHORUS: Phosphorus: 4.8 mg/dL — ABNORMAL HIGH (ref 2.3–4.6)

## 2014-05-01 LAB — ETHANOL: ALCOHOL ETHYL (B): 16 mg/dL — AB (ref 0–11)

## 2014-05-01 LAB — MAGNESIUM: Magnesium: 1.9 mg/dL (ref 1.5–2.5)

## 2014-05-01 MED ORDER — DOCUSATE SODIUM 100 MG PO CAPS
100.0000 mg | ORAL_CAPSULE | Freq: Two times a day (BID) | ORAL | Status: DC
  Administered 2014-05-01: 100 mg via ORAL
  Filled 2014-05-01 (×3): qty 1

## 2014-05-01 MED ORDER — ONDANSETRON HCL 4 MG/2ML IJ SOLN: 4.0000 mg | Freq: Four times a day (QID) | INTRAMUSCULAR | Status: DC | PRN

## 2014-05-01 MED ORDER — ALBUTEROL SULFATE (2.5 MG/3ML) 0.083% IN NEBU: 2.5000 mg | INHALATION_SOLUTION | RESPIRATORY_TRACT | Status: DC | PRN

## 2014-05-01 MED ORDER — PANTOPRAZOLE SODIUM 40 MG PO TBEC: 40.0000 mg | DELAYED_RELEASE_TABLET | Freq: Every day | ORAL | Status: DC

## 2014-05-01 MED ORDER — ACETAMINOPHEN 325 MG PO TABS: 650.0000 mg | ORAL_TABLET | Freq: Four times a day (QID) | ORAL | Status: DC | PRN

## 2014-05-01 MED ORDER — METOPROLOL SUCCINATE ER 50 MG PO TB24
150.0000 mg | ORAL_TABLET | Freq: Every day | ORAL | Status: DC
  Administered 2014-05-01: 150 mg via ORAL
  Filled 2014-05-01: qty 1

## 2014-05-01 MED ORDER — HYDRALAZINE HCL 50 MG PO TABS
100.0000 mg | ORAL_TABLET | Freq: Three times a day (TID) | ORAL | Status: DC
  Administered 2014-05-01: 100 mg via ORAL
  Filled 2014-05-01 (×4): qty 2

## 2014-05-01 MED ORDER — ATORVASTATIN CALCIUM 10 MG PO TABS
10.0000 mg | ORAL_TABLET | Freq: Every day | ORAL | Status: DC
  Administered 2014-05-01: 10 mg via ORAL
  Filled 2014-05-01: qty 1

## 2014-05-01 MED ORDER — ACETAMINOPHEN 650 MG RE SUPP: 650.0000 mg | Freq: Four times a day (QID) | RECTAL | Status: DC | PRN

## 2014-05-01 MED ORDER — RAMIPRIL 2.5 MG PO CAPS
2.5000 mg | ORAL_CAPSULE | Freq: Every day | ORAL | Status: DC
  Administered 2014-05-01: 2.5 mg via ORAL
  Filled 2014-05-01: qty 1

## 2014-05-01 MED ORDER — LORAZEPAM 1 MG PO TABS: 0.0000 mg | ORAL_TABLET | Freq: Four times a day (QID) | ORAL | Status: DC

## 2014-05-01 MED ORDER — LORAZEPAM 1 MG PO TABS
1.0000 mg | ORAL_TABLET | Freq: Four times a day (QID) | ORAL | Status: DC | PRN
Start: 2014-05-01 — End: 2014-05-01

## 2014-05-01 MED ORDER — LORAZEPAM 2 MG/ML IJ SOLN: 1.0000 mg | Freq: Four times a day (QID) | INTRAMUSCULAR | Status: DC | PRN

## 2014-05-01 MED ORDER — ADULT MULTIVITAMIN W/MINERALS CH
1.0000 | ORAL_TABLET | Freq: Every day | ORAL | Status: DC
  Administered 2014-05-01: 1 via ORAL
  Filled 2014-05-01: qty 1

## 2014-05-01 MED ORDER — LORAZEPAM 1 MG PO TABS: 0.0000 mg | ORAL_TABLET | Freq: Two times a day (BID) | ORAL | Status: DC

## 2014-05-01 MED ORDER — INSULIN ASPART 100 UNIT/ML ~~LOC~~ SOLN
0.0000 [IU] | SUBCUTANEOUS | Status: DC
  Administered 2014-05-01: 1 [IU] via SUBCUTANEOUS

## 2014-05-01 MED ORDER — HYDRALAZINE HCL 20 MG/ML IJ SOLN
10.0000 mg | INTRAMUSCULAR | Status: DC | PRN
  Filled 2014-05-01: qty 1

## 2014-05-01 MED ORDER — SODIUM CHLORIDE 0.9 % IV SOLN
INTRAVENOUS | Status: AC
  Administered 2014-05-01: 03:00:00 via INTRAVENOUS

## 2014-05-01 MED ORDER — ASPIRIN 81 MG PO CHEW: 81.0000 mg | CHEWABLE_TABLET | Freq: Every day | ORAL | Status: DC

## 2014-05-01 MED ORDER — ONDANSETRON HCL 4 MG PO TABS: 4.0000 mg | ORAL_TABLET | Freq: Four times a day (QID) | ORAL | Status: DC | PRN

## 2014-05-01 MED ORDER — FOLIC ACID 1 MG PO TABS
1.0000 mg | ORAL_TABLET | Freq: Every day | ORAL | Status: DC
  Administered 2014-05-01: 1 mg via ORAL
  Filled 2014-05-01: qty 1

## 2014-05-01 MED ORDER — ENOXAPARIN SODIUM 40 MG/0.4ML ~~LOC~~ SOLN
40.0000 mg | SUBCUTANEOUS | Status: DC
  Administered 2014-05-01: 40 mg via SUBCUTANEOUS
  Filled 2014-05-01 (×2): qty 0.4

## 2014-05-01 MED ORDER — THIAMINE HCL 100 MG/ML IJ SOLN
100.0000 mg | Freq: Every day | INTRAMUSCULAR | Status: DC
  Filled 2014-05-01: qty 1

## 2014-05-01 MED ORDER — HYDROCODONE-ACETAMINOPHEN 5-325 MG PO TABS: 1.0000 | ORAL_TABLET | ORAL | Status: DC | PRN

## 2014-05-01 MED ORDER — SODIUM CHLORIDE 0.9 % IJ SOLN
3.0000 mL | Freq: Two times a day (BID) | INTRAMUSCULAR | Status: DC
  Administered 2014-05-01: 3 mL via INTRAVENOUS

## 2014-05-01 MED ORDER — VITAMIN B-1 100 MG PO TABS
100.0000 mg | ORAL_TABLET | Freq: Every day | ORAL | Status: DC
  Administered 2014-05-01: 100 mg via ORAL
  Filled 2014-05-01: qty 1

## 2014-05-01 NOTE — Progress Notes (Signed)
Echocardiogram 2D Echocardiogram has been performed.  Alexander [name] 05/01/2014, 9:14 AM

## 2014-05-01 NOTE — Discharge Summary (Signed)
Physician Discharge Summary  Alexander [name] [id number] DOB: Mar 23, 1961 DOA: 04/30/2014  PCP: Lorayne Marek, MD  Admit date: 04/30/2014 Discharge date: 05/01/2014  Recommendations for Outpatient Follow-up:  1. Repeat TFTs  Discharge Diagnoses:  Active Problems:   DIABETES MELLITUS, TYPE II   ALCOHOL ABUSE   TOBACCO USE   HYPERTENSION   Anemia   Syncope elevated TSH Diastolic dysfunction  Discharge Condition: stable  Filed Weights   04/30/14 2012 05/01/14 0231  Weight: 113.399 kg (250 lb) 113 kg (249 lb 1.9 oz)    History of present illness:  53 y.o. male  has a past medical history of Hypertension; Dysrhythmia; Arthritis; and Diabetes mellitus.  Presented with  patient was sitting down and started to feel lightheaded and slumped over. His body had a few random jerks but no seizure. No loss of bladder function. Patient had a second episode while on the cammode after urination. On arrival to ER his face looked flushed. Denies any chest pain no shortness of breath. They checked his blood sugar and it was 97. Patient drinks about 6 beers a day and states does not get withdrawal symptoms. He reports lightheadedness with trying to get up. Have not had much intake all day.  In ER he was noted to be hypertensive up to 201/82 the blood pressure went down to 150 when patient sat up  Patient have had a recent admission for Left forefoot osteomyelitis requiring amputation.   Hospital Course:  Observed on telemetry without further syncope. Given saline infusion.  Carotid dopplers showed no critical stenosis, echo showed normal EF, but LVH and diastolic dysfunction.  Vitals stable.  Upon further question, patient reports only eating radishes, because he is trying to lose weight, also sitting out in the heat, drinking only beer.  Suspect related to heat, minimal PO intake and only beer ingestion. TSH 4.71, but normal free T4 and free T3. Recommend repeating TFTs in a few  months  Procedures:  none  Consultations:  none  Discharge Exam: Filed Vitals:   05/01/14 0430  BP: 157/59  Pulse: 77  Temp: 98.2 F (36.8 C)  Resp: 18    General: alert, oriented Cardiovascular: RRR Respiratory: CTA Ext trace edema  Discharge Instructions   Diet - low sodium heart healthy    Complete by:  As directed      Increase activity slowly    Complete by:  As directed             Medication List         aspirin 81 MG chewable tablet  Chew 81 mg by mouth daily.     atorvastatin 10 MG tablet  Commonly known as:  LIPITOR  Take 1 tablet (10 mg total) by mouth daily.     glipiZIDE 5 MG tablet  Commonly known as:  GLUCOTROL  Take 5 mg by mouth 2 (two) times daily.     glucose blood test strip  Use as instructed     hydrALAZINE 100 MG tablet  Commonly known as:  APRESOLINE  Take 100 mg by mouth 2 (two) times daily.     metoprolol succinate 50 MG 24 hr tablet  Commonly known as:  TOPROL-XL  Take 150 mg by mouth daily. Take with or immediately following a meal.     multivitamins ther. w/minerals Tabs tablet  Take 1 tablet by mouth daily. Centrum Silver     sitaGLIPtin 25 MG tablet  Commonly known as:  JANUVIA  Take 1 tablet (25  mg total) by mouth daily.       Allergies  Allergen Reactions  . Sulfa Antibiotics Rash       Follow-up Information   Follow up with Lorayne Marek, MD In 2 weeks. (to follow up thyroid tests)    Specialty:  Internal Medicine   Contact information:   [place] [address] [phone]        The results of significant diagnostics from this hospitalization (including imaging, microbiology, ancillary and laboratory) are listed below for reference.    Significant Diagnostic Studies: Dg Chest 2 View  04/30/2014   CLINICAL DATA:  53 year old male with congestion and syncope.  EXAM: CHEST  2 VIEW  COMPARISON:  10/27/2013 and 06/12/2012 radiographs  FINDINGS: The cardiomediastinal silhouette  is unremarkable.  There is no evidence of focal airspace disease, pulmonary edema, suspicious pulmonary nodule/mass, pleural effusion, or pneumothorax. No acute bony abnormalities are identified.  IMPRESSION: No active cardiopulmonary disease.   Electronically Signed   By: Hassan Rowan M.D.   On: 04/30/2014 21:15   Carotid doppler Right: mild soft plaque origin ICA. Left: mild to moderate mixed plaque origin ICA. Bilateral: 1-39% ICA stenosis. Vertebral artery flow is antegrade. ICA/CCA ratio: R-0.87 L-0.87.  Echo Left ventricle: The cavity size was normal. Wall thickness was increased in a pattern of moderate LVH. Systolic function was normal. The estimated ejection fraction was in the range of 55% to 60%. Wall motion was normal; there were no regional wall motion abnormalities. Features are consistent with a pseudonormal left ventricular filling pattern, with concomitant abnormal relaxation and increased filling pressure (grade 2 diastolic dysfunction). Doppler parameters are consistent with high ventricular filling pressure. - Left atrium: The atrium was mildly dilated.  Impressions:  - Normal LV function; moderate to severe LVH; grade 2 diastolic dysfunction.   Microbiology: No results found for this or any previous visit (from the past 240 hour(s)).   Labs: Basic Metabolic Panel:  Recent Labs Lab 04/30/14 2025 04/30/14 2353 05/01/14 0315  NA 133* 134* 134*  K 4.5 4.7 4.9  CL 95* 96 97  CO2 22 19 21   GLUCOSE 80 84 103*  BUN 18 19 20   CREATININE 1.13 1.19 1.23  CALCIUM 9.0 9.0 9.1  MG  --   --  1.9  PHOS  --   --  4.8*   Liver Function Tests:  Recent Labs Lab 04/30/14 2353 05/01/14 0315  AST 39* 35  ALT 57* 55*  ALKPHOS 79 79  BILITOT 0.6 0.7  PROT 6.8 6.5  ALBUMIN 3.3* 3.2*   No results found for this basename: LIPASE, AMYLASE,  in the last 168 hours No results found for this basename: AMMONIA,  in the last 168 hours CBC:  Recent Labs Lab  04/29/14 0831 04/30/14 2025 05/01/14 0315  WBC  --  7.9 7.7  HGB 12.0* 11.7* 10.9*  HCT  --  34.1* 32.0*  MCV  --  86.5 87.0  PLT  --  209 192   Cardiac Enzymes:  Recent Labs Lab 04/30/14 2353 05/01/14 0315 05/01/14 0831  TROPONINI <0.30 <0.30 <0.30   BNP: BNP (last 3 results) No results found for this basename: PROBNP,  in the last 8760 hours CBG:  Recent Labs Lab 05/01/14 0427 05/01/14 0847 05/01/14 1137  GLUCAP 149* 168* 121*       Signed:  SULLIVAN,CORINNA L  [facility] 05/01/2014, 12:09 PM
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
## 2014-05-01 NOTE — Progress Notes (Signed)
VASCULAR LAB PRELIMINARY  PRELIMINARY  PRELIMINARY  PRELIMINARY  Carotid Dopplers completed.    Preliminary report:  1-39% ICA stenosis.  Vertebral artery flow is antegrade.  KANADY, CANDACE, RVT 05/01/2014, 10:21 AM

## 2014-05-12 NOTE — ED Provider Notes (Signed)
I saw and evaluated the patient, reviewed the resident's note and I agree with the findings and plan.   Date: 05/12/2014  Rate: 71  Rhythm: normal sinus rhythm  QRS Axis: normal  Intervals: normal  ST/T Wave abnormalities: nonspecific ST/T changes  Conduction Disutrbances:right bundle branch block and left anterior fascicular block  Narrative Interpretation:   Old EKG Reviewed: none available    Results for orders placed during the hospital encounter of 04/30/14  CBC      Result Value Ref Range   WBC 7.9  4.0 - 10.5 K/uL   RBC 3.94 (*) 4.22 - 5.81 MIL/uL   Hemoglobin 11.7 (*) 13.0 - 17.0 g/dL   HCT 34.1 (*) 39.0 - 52.0 %   MCV 86.5  78.0 - 100.0 fL   MCH 29.7  26.0 - 34.0 pg   MCHC 34.3  30.0 - 36.0 g/dL   RDW 13.8  11.5 - 15.5 %   Platelets 209  150 - 400 K/uL  BASIC METABOLIC PANEL      Result Value Ref Range   Sodium 133 (*) 137 - 147 mEq/L   Potassium 4.5  3.7 - 5.3 mEq/L   Chloride 95 (*) 96 - 112 mEq/L   CO2 22  19 - 32 mEq/L   Glucose, Bld 80  70 - 99 mg/dL   BUN 18  6 - 23 mg/dL   Creatinine, Ser 1.13  0.50 - 1.35 mg/dL   Calcium 9.0  8.4 - 10.5 mg/dL   GFR calc non Af Amer 71 (*) >90 mL/min   GFR calc Af Amer 83 (*) >90 mL/min  URINE RAPID DRUG SCREEN (HOSP PERFORMED)      Result Value Ref Range   Opiates NONE DETECTED  NONE DETECTED   Cocaine NONE DETECTED  NONE DETECTED   Benzodiazepines NONE DETECTED  NONE DETECTED   Amphetamines NONE DETECTED  NONE DETECTED   Tetrahydrocannabinol NONE DETECTED  NONE DETECTED   Barbiturates NONE DETECTED  NONE DETECTED  COMPREHENSIVE METABOLIC PANEL      Result Value Ref Range   Sodium 134 (*) 137 - 147 mEq/L   Potassium 4.7  3.7 - 5.3 mEq/L   Chloride 96  96 - 112 mEq/L   CO2 19  19 - 32 mEq/L   Glucose, Bld 84  70 - 99 mg/dL   BUN 19  6 - 23 mg/dL   Creatinine, Ser 1.19  0.50 - 1.35 mg/dL   Calcium 9.0  8.4 - 10.5 mg/dL   Total Protein 6.8  6.0 - 8.3 g/dL   Albumin 3.3 (*) 3.5 - 5.2 g/dL   AST 39 (*) 0 - 37 U/L    ALT 57 (*) 0 - 53 U/L   Alkaline Phosphatase 79  39 - 117 U/L   Total Bilirubin 0.6  0.3 - 1.2 mg/dL   GFR calc non Af Amer 67 (*) >90 mL/min   GFR calc Af Amer 78 (*) >90 mL/min  PROTIME-INR      Result Value Ref Range   Prothrombin Time 13.2  11.6 - 15.2 seconds   INR 1.02  0.00 - 1.49  ETHANOL      Result Value Ref Range   Alcohol, Ethyl (B) 16 (*) 0 - 11 mg/dL  TROPONIN I      Result Value Ref Range   Troponin I <0.30  <0.30 ng/mL  HEMOGLOBIN A1C      Result Value Ref Range   Hemoglobin A1C 6.0 (*) <5.7 %  Mean Plasma Glucose 126 (*) <117 mg/dL  MAGNESIUM      Result Value Ref Range   Magnesium 1.9  1.5 - 2.5 mg/dL  PHOSPHORUS      Result Value Ref Range   Phosphorus 4.8 (*) 2.3 - 4.6 mg/dL  TSH      Result Value Ref Range   TSH 4.760 (*) 0.350 - 4.500 uIU/mL  COMPREHENSIVE METABOLIC PANEL      Result Value Ref Range   Sodium 134 (*) 137 - 147 mEq/L   Potassium 4.9  3.7 - 5.3 mEq/L   Chloride 97  96 - 112 mEq/L   CO2 21  19 - 32 mEq/L   Glucose, Bld 103 (*) 70 - 99 mg/dL   BUN 20  6 - 23 mg/dL   Creatinine, Ser 1.23  0.50 - 1.35 mg/dL   Calcium 9.1  8.4 - 10.5 mg/dL   Total Protein 6.5  6.0 - 8.3 g/dL   Albumin 3.2 (*) 3.5 - 5.2 g/dL   AST 35  0 - 37 U/L   ALT 55 (*) 0 - 53 U/L   Alkaline Phosphatase 79  39 - 117 U/L   Total Bilirubin 0.7  0.3 - 1.2 mg/dL   GFR calc non Af Amer 64 (*) >90 mL/min   GFR calc Af Amer 75 (*) >90 mL/min  CBC      Result Value Ref Range   WBC 7.7  4.0 - 10.5 K/uL   RBC 3.68 (*) 4.22 - 5.81 MIL/uL   Hemoglobin 10.9 (*) 13.0 - 17.0 g/dL   HCT 32.0 (*) 39.0 - 52.0 %   MCV 87.0  78.0 - 100.0 fL   MCH 29.6  26.0 - 34.0 pg   MCHC 34.1  30.0 - 36.0 g/dL   RDW 13.8  11.5 - 15.5 %   Platelets 192  150 - 400 K/uL  TROPONIN I      Result Value Ref Range   Troponin I <0.30  <0.30 ng/mL  TROPONIN I      Result Value Ref Range   Troponin I <0.30  <0.30 ng/mL  URINALYSIS, ROUTINE W REFLEX MICROSCOPIC      Result Value Ref Range    Color, Urine YELLOW  YELLOW   APPearance CLEAR  CLEAR   Specific Gravity, Urine 1.016  1.005 - 1.030   pH 5.5  5.0 - 8.0   Glucose, UA NEGATIVE  NEGATIVE mg/dL   Hgb urine dipstick NEGATIVE  NEGATIVE   Bilirubin Urine NEGATIVE  NEGATIVE   Ketones, ur NEGATIVE  NEGATIVE mg/dL   Protein, ur >300 (*) NEGATIVE mg/dL   Urobilinogen, UA 0.2  0.0 - 1.0 mg/dL   Nitrite NEGATIVE  NEGATIVE   Leukocytes, UA NEGATIVE  NEGATIVE  URINE MICROSCOPIC-ADD ON      Result Value Ref Range   Squamous Epithelial / LPF RARE  RARE   WBC, UA 0-2  <3 WBC/hpf   RBC / HPF 0-2  <3 RBC/hpf   Casts HYALINE CASTS (*) NEGATIVE  GLUCOSE, CAPILLARY      Result Value Ref Range   Glucose-Capillary 149 (*) 70 - 99 mg/dL   Comment 1 Documented in Chart     Comment 2 Notify RN    GLUCOSE, CAPILLARY      Result Value Ref Range   Glucose-Capillary 168 (*) 70 - 99 mg/dL   Comment 1 Notify RN    T4, FREE      Result Value Ref Range  Free T4 1.27  0.80 - 1.80 ng/dL  T3, FREE      Result Value Ref Range   T3, Free 2.9  2.3 - 4.2 pg/mL  GLUCOSE, CAPILLARY      Result Value Ref Range   Glucose-Capillary 121 (*) 70 - 99 mg/dL   Comment 1 Notify RN    I-STAT TROPOININ, ED      Result Value Ref Range   Troponin i, poc 0.02  0.00 - 0.08 ng/mL   Comment 3            Dg Chest 2 View  04/30/2014   CLINICAL DATA:  53 year old male with congestion and syncope.  EXAM: CHEST  2 VIEW  COMPARISON:  10/27/2013 and 06/12/2012 radiographs  FINDINGS: The cardiomediastinal silhouette is unremarkable.  There is no evidence of focal airspace disease, pulmonary edema, suspicious pulmonary nodule/mass, pleural effusion, or pneumothorax. No acute bony abnormalities are identified.  IMPRESSION: No active cardiopulmonary disease.   Electronically Signed   By: Hassan Rowan M.D.   On: 04/30/2014 21:15    Patient with unexplained syncope, no significant lab findings or arrhythmia on monitor. No significant physical exam findings. Tele admission to  be arranged.  Fredia Sorrow, MD 05/12/14 [phone]

## 2014-05-13 ENCOUNTER — Encounter (HOSPITAL_COMMUNITY)
Admission: RE | Admit: 2014-05-13 | Discharge: 2014-05-13 | Disposition: A | Discharge status: Home / Self Care | Payer: Medicaid Other | Source: Ambulatory Visit | Attending: Nephrology | Admitting: Nephrology

## 2014-05-13 DIAGNOSIS — D638 Anemia in other chronic diseases classified elsewhere: Secondary | ICD-10-CM | POA: Insufficient documentation

## 2014-05-13 DIAGNOSIS — N183 Chronic kidney disease, stage 3 unspecified: Secondary | ICD-10-CM | POA: Diagnosis not present

## 2014-05-13 LAB — POCT HEMOGLOBIN-HEMACUE: HEMOGLOBIN: 10.9 g/dL — AB (ref 13.0–17.0)

## 2014-05-13 LAB — IRON AND TIBC
Iron: 120 ug/dL (ref 42–135)
Saturation Ratios: 56 % — ABNORMAL HIGH (ref 20–55)
TIBC: 214 ug/dL — AB (ref 215–435)
UIBC: 94 ug/dL — AB (ref 125–400)

## 2014-05-13 LAB — FERRITIN: Ferritin: 496 ng/mL — ABNORMAL HIGH (ref 22–322)

## 2014-05-13 MED ORDER — DARBEPOETIN ALFA-POLYSORBATE 60 MCG/0.3ML IJ SOLN
60.0000 ug | INTRAMUSCULAR | Status: DC
  Administered 2014-05-13: 60 ug via SUBCUTANEOUS

## 2014-05-13 MED ORDER — DARBEPOETIN ALFA-POLYSORBATE 60 MCG/0.3ML IJ SOLN
INTRAMUSCULAR | Status: AC
  Filled 2014-05-13: qty 0.3

## 2014-05-20 ENCOUNTER — Encounter (HOSPITAL_COMMUNITY)
Admission: RE | Admit: 2014-05-20 | Discharge: 2014-05-20 | Disposition: A | Discharge status: Home / Self Care | Payer: Medicaid Other | Source: Ambulatory Visit | Attending: Nephrology | Admitting: Nephrology

## 2014-05-20 DIAGNOSIS — D638 Anemia in other chronic diseases classified elsewhere: Secondary | ICD-10-CM | POA: Diagnosis not present

## 2014-05-20 LAB — POCT HEMOGLOBIN-HEMACUE: Hemoglobin: 10.2 g/dL — ABNORMAL LOW (ref 13.0–17.0)

## 2014-05-20 MED ORDER — DARBEPOETIN ALFA-POLYSORBATE 60 MCG/0.3ML IJ SOLN
INTRAMUSCULAR | Status: AC
  Filled 2014-05-20: qty 0.3

## 2014-05-20 MED ORDER — DARBEPOETIN ALFA-POLYSORBATE 60 MCG/0.3ML IJ SOLN
60.0000 ug | INTRAMUSCULAR | Status: DC
  Administered 2014-05-20: 60 ug via SUBCUTANEOUS

## 2014-05-25 ENCOUNTER — Ambulatory Visit: Payer: Medicaid Other | Attending: Internal Medicine | Admitting: Internal Medicine

## 2014-05-25 ENCOUNTER — Encounter: Payer: Self-pay | Admitting: Internal Medicine

## 2014-05-25 VITALS — BP 145/60 | HR 68 | Temp 97.8°F | Resp 16 | Wt 250.6 lb

## 2014-05-25 DIAGNOSIS — E119 Type 2 diabetes mellitus without complications: Secondary | ICD-10-CM | POA: Diagnosis not present

## 2014-05-25 DIAGNOSIS — Z7982 Long term (current) use of aspirin: Secondary | ICD-10-CM | POA: Insufficient documentation

## 2014-05-25 DIAGNOSIS — E785 Hyperlipidemia, unspecified: Secondary | ICD-10-CM | POA: Insufficient documentation

## 2014-05-25 DIAGNOSIS — F17200 Nicotine dependence, unspecified, uncomplicated: Secondary | ICD-10-CM | POA: Diagnosis not present

## 2014-05-25 DIAGNOSIS — I10 Essential (primary) hypertension: Secondary | ICD-10-CM | POA: Diagnosis not present

## 2014-05-25 DIAGNOSIS — Z951 Presence of aortocoronary bypass graft: Secondary | ICD-10-CM | POA: Diagnosis not present

## 2014-05-25 DIAGNOSIS — S98139A Complete traumatic amputation of one unspecified lesser toe, initial encounter: Secondary | ICD-10-CM | POA: Insufficient documentation

## 2014-05-25 DIAGNOSIS — D649 Anemia, unspecified: Secondary | ICD-10-CM | POA: Insufficient documentation

## 2014-05-25 DIAGNOSIS — E139 Other specified diabetes mellitus without complications: Secondary | ICD-10-CM

## 2014-05-25 DIAGNOSIS — Z1211 Encounter for screening for malignant neoplasm of colon: Secondary | ICD-10-CM

## 2014-05-25 DIAGNOSIS — E089 Diabetes mellitus due to underlying condition without complications: Secondary | ICD-10-CM | POA: Insufficient documentation

## 2014-05-25 LAB — GLUCOSE, POCT (MANUAL RESULT ENTRY): POC Glucose: 135 mg/dl — AB (ref 70–99)

## 2014-05-25 NOTE — Progress Notes (Signed)
MRN: [id number] Name: Alexander [name]  Sex: male Age: 53 y.o. DOB: [birth year]  Allergies: Sulfa antibiotics  Chief Complaint  Patient presents with  . Follow-up    HPI: Patient is 53 y.o. male who has to of diabetes hypertension her hyperlipidemia comes today for followup, today's blood pressure initially was elevated, repeat manual blood pressure is 145/60, currently patient is taking metoprolol as well as hydralazine used to be on amlodipine but not anymore, denies any headache dizziness chest and shortness of breath, follows up with the nephrologist has history of anemia he denies any rectal bleeding patient never had colonoscopy done.  Past Medical History  Diagnosis Date  . Hypertension   . Dysrhythmia     h/o pvc's  . Arthritis   . Diabetes mellitus     dx---been  awhile    Past Surgical History  Procedure Laterality Date  . Lumbar laminectomy  06/13/2012    Procedure: MICRODISCECTOMY LUMBAR LAMINECTOMY;  Surgeon: Jessy Oto, MD;  Location: [place];  Service: Orthopedics;  Laterality: N/A;  Central laminectomy L2-3, L3-4, L4-5  . Amputation Left 08/19/2013    Procedure: AMPUTATION RAY;  Surgeon: Alta Corning, MD;  Location: [facility];  Service: Orthopedics;  Laterality: Left;  ray amputation left 5th  . Femoral-tibial bypass graft Left 10/27/2013    Procedure: BYPASS GRAFT LEFT FEMORAL- POSTERIOR TIBIAL ARTERY;  Surgeon: Angelia Mould, MD;  Location: [facility];  Service: Vascular;  Laterality: Left;  . I&d extremity Left 10/27/2013    Procedure: IRRIGATION AND DEBRIDEMENT EXTREMITY- LEFT FOOT;  Surgeon: Angelia Mould, MD;  Location: [place];  Service: Vascular;  Laterality: Left;  . Amputation Left 10/27/2013    Procedure: AMPUTATION DIGIT-LEFT 4TH TOE, 4th and 5th metatarsal.;  Surgeon: Angelia Mould, MD;  Location: [place];  Service: Vascular;  Laterality: Left;  . Amputation Left 11/04/2013    Procedure: LEFT FOOT TRANS-METATARSAL AMPUTATION WITH WOUND  CLOSURE ;  Surgeon: Alta Corning, MD;  Location: [place];  Service: Orthopedics;  Laterality: Left;      Medication List       This list is accurate as of: 05/25/14 10:51 AM.  Always use your most recent med list.               aspirin 81 MG chewable tablet  Chew 81 mg by mouth daily.     atorvastatin 10 MG tablet  Commonly known as:  LIPITOR  Take 1 tablet (10 mg total) by mouth daily.     glipiZIDE 5 MG tablet  Commonly known as:  GLUCOTROL  Take 5 mg by mouth 2 (two) times daily.     glucose blood test strip  Use as instructed     hydrALAZINE 100 MG tablet  Commonly known as:  APRESOLINE  Take 100 mg by mouth 2 (two) times daily.     metoprolol succinate 50 MG 24 hr tablet  Commonly known as:  TOPROL-XL  Take 150 mg by mouth daily. Take with or immediately following a meal.     multivitamins ther. w/minerals Tabs tablet  Take 1 tablet by mouth daily. Centrum Silver     sitaGLIPtin 25 MG tablet  Commonly known as:  JANUVIA  Take 1 tablet (25 mg total) by mouth daily.        No orders of the defined types were placed in this encounter.    Immunization History  Administered Date(s) Administered  . Influenza,inj,Quad PF,36+ Mos 10/22/2013  .  Pneumococcal Polysaccharide-23 10/22/2013    Family History  Problem Relation Age of Onset  . Diabetes type II Mother   . Hypertension Mother   . Liver cancer Father   . Diabetes type II Sister   . Diabetes type II Brother   . Kidney failure Brother   . Diabetes type II Sister     History  Substance Use Topics  . Smoking status: Current Some Day Smoker -- 1.50 packs/day for 30 years    Types: Cigarettes, Cigars  . Smokeless tobacco: Never Used  . Alcohol Use: 21.0 oz/week    35 Cans of beer per week     Comment: 6 of 12 oz beers a day    Review of Systems   As noted in HPI  Filed Vitals:   05/25/14 0956  BP: 145/60  Pulse:   Temp:   Resp:     Physical Exam  Physical Exam  Constitutional: No  distress.  Eyes: EOM are normal. Pupils are equal, round, and reactive to light.  Cardiovascular: Normal rate and regular rhythm.   Pulmonary/Chest: Breath sounds normal. No respiratory distress. He has no wheezes. He has no rales.  Musculoskeletal: He exhibits no edema.    CBC    Component Value Date/Time   WBC 7.7 05/01/2014 0315   RBC 3.68* 05/01/2014 0315   RBC 3.32* 10/25/2013 1636   HGB 10.2* 05/20/2014 0813   HCT 32.0* 05/01/2014 0315   PLT 192 05/01/2014 0315   MCV 87.0 05/01/2014 0315   LYMPHSABS 2.4 11/20/2013 1422   MONOABS 0.7 11/20/2013 1422   EOSABS 1.1* 11/20/2013 1422   BASOSABS 0.1 11/20/2013 1422    CMP     Component Value Date/Time   NA 134* 05/01/2014 0315   K 4.9 05/01/2014 0315   CL 97 05/01/2014 0315   CO2 21 05/01/2014 0315   GLUCOSE 103* 05/01/2014 0315   BUN 20 05/01/2014 0315   CREATININE 1.23 05/01/2014 0315   CREATININE 2.42* 11/20/2013 1422   CALCIUM 9.1 05/01/2014 0315   PROT 6.5 05/01/2014 0315   ALBUMIN 3.2* 05/01/2014 0315   AST 35 05/01/2014 0315   ALT 55* 05/01/2014 0315   ALKPHOS 79 05/01/2014 0315   BILITOT 0.7 05/01/2014 0315   GFRNONAA 64* 05/01/2014 0315   GFRNONAA 29* 11/20/2013 1422   GFRAA 75* 05/01/2014 0315   GFRAA 34* 11/20/2013 1422    Lab Results  Component Value Date/Time   CHOL 147 11/20/2013  2:22 PM    No components found with this basename: hga1c    Lab Results  Component Value Date/Time   AST 35 05/01/2014  3:15 AM    Assessment and Plan  Diabetes mellitus due to underlying condition without complications -  Results for orders placed in visit on 05/25/14  GLUCOSE, POCT (MANUAL RESULT ENTRY)      Result Value Ref Range   POC Glucose 135 (*) 70 - 99 mg/dl   Diabetes is well controlled last A1c was 6.0% continue with current medications.  HYPERTENSION Pressure is borderline elevated, advise for DASH diet continue with current meds.  Anemia, unspecified - Plan: Ambulatory referral to Gastroenterology  Special screening for  malignant neoplasms, colon - Plan: Ambulatory referral to Gastroenterology   Health Maintenance -Colonoscopy: Referred to GI   Return in about 3 months (around 08/25/2014) for diabetes, hypertension.  Lorayne Marek, MD

## 2014-05-25 NOTE — Patient Instructions (Signed)
DASH Eating Plan  DASH stands for "Dietary Approaches to Stop Hypertension." The DASH eating plan is a healthy eating plan that has been shown to reduce high blood pressure (hypertension). Additional health benefits may include reducing the risk of type 2 diabetes mellitus, heart disease, and stroke. The DASH eating plan may also help with weight loss.  WHAT DO I NEED TO KNOW ABOUT THE DASH EATING PLAN?  For the DASH eating plan, you will follow these general guidelines:  · Choose foods with a percent daily value for sodium of less than 5% (as listed on the food label).  · Use salt-free seasonings or herbs instead of table salt or sea salt.  · Check with your health care provider or pharmacist before using salt substitutes.  · Eat lower-sodium products, often labeled as "lower sodium" or "no salt added."  · Eat fresh foods.  · Eat more vegetables, fruits, and low-fat dairy products.  · Choose whole grains. Look for the word "whole" as the first word in the ingredient list.  · Choose fish and skinless chicken or turkey more often than red meat. Limit fish, poultry, and meat to 6 oz (170 g) each day.  · Limit sweets, desserts, sugars, and sugary drinks.  · Choose heart-healthy fats.  · Limit cheese to 1 oz (28 g) per day.  · Eat more home-cooked food and less restaurant, buffet, and fast food.  · Limit fried foods.  · Cook foods using methods other than frying.  · Limit canned vegetables. If you do use them, rinse them well to decrease the sodium.  · When eating at a restaurant, ask that your food be prepared with less salt, or no salt if possible.  WHAT FOODS CAN I EAT?  Seek help from a dietitian for individual calorie needs.  Grains  Whole grain or whole wheat bread. Brown rice. Whole grain or whole wheat pasta. Quinoa, bulgur, and whole grain cereals. Low-sodium cereals. Corn or whole wheat flour tortillas. Whole grain cornbread. Whole grain crackers. Low-sodium crackers.  Vegetables  Fresh or frozen vegetables  (raw, steamed, roasted, or grilled). Low-sodium or reduced-sodium tomato and vegetable juices. Low-sodium or reduced-sodium tomato sauce and paste. Low-sodium or reduced-sodium canned vegetables.   Fruits  All fresh, canned (in natural juice), or frozen fruits.  Meat and Other Protein Products  Ground beef (85% or leaner), grass-fed beef, or beef trimmed of fat. Skinless chicken or turkey. Ground chicken or turkey. Pork trimmed of fat. All fish and seafood. Eggs. Dried beans, peas, or lentils. Unsalted nuts and seeds. Unsalted canned beans.  Dairy  Low-fat dairy products, such as skim or 1% milk, 2% or reduced-fat cheeses, low-fat ricotta or cottage cheese, or plain low-fat yogurt. Low-sodium or reduced-sodium cheeses.  Fats and Oils  Tub margarines without trans fats. Light or reduced-fat mayonnaise and salad dressings (reduced sodium). Avocado. Safflower, olive, or canola oils. Natural peanut or almond butter.  Other  Unsalted popcorn and pretzels.  The items listed above may not be a complete list of recommended foods or beverages. Contact your dietitian for more options.  WHAT FOODS ARE NOT RECOMMENDED?  Grains  White bread. White pasta. White rice. Refined cornbread. Bagels and croissants. Crackers that contain trans fat.  Vegetables  Creamed or fried vegetables. Vegetables in a cheese sauce. Regular canned vegetables. Regular canned tomato sauce and paste. Regular tomato and vegetable juices.  Fruits  Dried fruits. Canned fruit in light or heavy syrup. Fruit juice.  Meat and Other Protein   Products  Fatty cuts of meat. Ribs, chicken wings, bacon, sausage, bologna, salami, chitterlings, fatback, hot dogs, bratwurst, and packaged luncheon meats. Salted nuts and seeds. Canned beans with salt.  Dairy  Whole or 2% milk, cream, half-and-half, and cream cheese. Whole-fat or sweetened yogurt. Full-fat cheeses or blue cheese. Nondairy creamers and whipped toppings. Processed cheese, cheese spreads, or cheese  curds.  Condiments  Onion and garlic salt, seasoned salt, table salt, and sea salt. Canned and packaged gravies. Worcestershire sauce. Tartar sauce. Barbecue sauce. Teriyaki sauce. Soy sauce, including reduced sodium. Steak sauce. Fish sauce. Oyster sauce. Cocktail sauce. Horseradish. Ketchup and mustard. Meat flavorings and tenderizers. Bouillon cubes. Hot sauce. Tabasco sauce. Marinades. Taco seasonings. Relishes.  Fats and Oils  Butter, stick margarine, lard, shortening, ghee, and bacon fat. Coconut, palm kernel, or palm oils. Regular salad dressings.  Other  Pickles and olives. Salted popcorn and pretzels.  The items listed above may not be a complete list of foods and beverages to avoid. Contact your dietitian for more information.  WHERE CAN I FIND MORE INFORMATION?  National Heart, Lung, and [employer] Institute: www.nhlbi.nih.gov/health/health-topics/topics/dash/  Document Released: 10/18/2011 Document Revised: 11/03/2013 Document Reviewed: 09/02/2013  [employer]® Patient Information ©2015 [employer]. This information is not intended to replace advice given to you by your health care provider. Make sure you discuss any questions you have with your health care provider.

## 2014-05-25 NOTE — Progress Notes (Signed)
Patient here for follow up on his diabetes 

## 2014-05-26 ENCOUNTER — Encounter: Payer: Self-pay | Admitting: Internal Medicine

## 2014-05-27 ENCOUNTER — Encounter (HOSPITAL_COMMUNITY)
Admission: RE | Admit: 2014-05-27 | Discharge: 2014-05-27 | Disposition: A | Discharge status: Home / Self Care | Payer: Medicaid Other | Source: Ambulatory Visit | Attending: Nephrology | Admitting: Nephrology

## 2014-05-27 DIAGNOSIS — D638 Anemia in other chronic diseases classified elsewhere: Secondary | ICD-10-CM | POA: Diagnosis not present

## 2014-05-27 LAB — POCT HEMOGLOBIN-HEMACUE: Hemoglobin: 10.7 g/dL — ABNORMAL LOW (ref 13.0–17.0)

## 2014-05-27 MED ORDER — DARBEPOETIN ALFA-POLYSORBATE 60 MCG/0.3ML IJ SOLN
60.0000 ug | INTRAMUSCULAR | Status: DC
  Administered 2014-05-27: 60 ug via SUBCUTANEOUS

## 2014-05-27 MED ORDER — DARBEPOETIN ALFA-POLYSORBATE 60 MCG/0.3ML IJ SOLN
INTRAMUSCULAR | Status: AC
  Filled 2014-05-27: qty 0.3

## 2014-06-03 ENCOUNTER — Encounter (HOSPITAL_COMMUNITY)
Admission: RE | Admit: 2014-06-03 | Discharge: 2014-06-03 | Disposition: A | Discharge status: Home / Self Care | Payer: Medicaid Other | Source: Ambulatory Visit | Attending: Nephrology | Admitting: Nephrology

## 2014-06-03 DIAGNOSIS — D638 Anemia in other chronic diseases classified elsewhere: Secondary | ICD-10-CM | POA: Diagnosis not present

## 2014-06-03 LAB — POCT HEMOGLOBIN-HEMACUE: Hemoglobin: 11.6 g/dL — ABNORMAL LOW (ref 13.0–17.0)

## 2014-06-03 MED ORDER — DARBEPOETIN ALFA-POLYSORBATE 60 MCG/0.3ML IJ SOLN
INTRAMUSCULAR | Status: AC
  Filled 2014-06-03: qty 0.3

## 2014-06-03 MED ORDER — DARBEPOETIN ALFA-POLYSORBATE 60 MCG/0.3ML IJ SOLN
60.0000 ug | INTRAMUSCULAR | Status: DC
  Administered 2014-06-03: 60 ug via SUBCUTANEOUS

## 2014-06-09 ENCOUNTER — Other Ambulatory Visit (HOSPITAL_COMMUNITY): Payer: Self-pay | Admitting: *Deleted

## 2014-06-10 ENCOUNTER — Encounter (HOSPITAL_COMMUNITY)
Admission: RE | Admit: 2014-06-10 | Discharge: 2014-06-10 | Disposition: A | Discharge status: Home / Self Care | Payer: Medicaid Other | Source: Ambulatory Visit | Attending: Nephrology | Admitting: Nephrology

## 2014-06-10 DIAGNOSIS — D638 Anemia in other chronic diseases classified elsewhere: Secondary | ICD-10-CM | POA: Diagnosis not present

## 2014-06-10 LAB — POCT HEMOGLOBIN-HEMACUE: Hemoglobin: 11.8 g/dL — ABNORMAL LOW (ref 13.0–17.0)

## 2014-06-10 MED ORDER — DARBEPOETIN ALFA-POLYSORBATE 60 MCG/0.3ML IJ SOLN
INTRAMUSCULAR | Status: AC
  Administered 2014-06-10: 60 ug via SUBCUTANEOUS
  Filled 2014-06-10: qty 0.3

## 2014-06-10 MED ORDER — DARBEPOETIN ALFA-POLYSORBATE 60 MCG/0.3ML IJ SOLN
60.0000 ug | INTRAMUSCULAR | Status: DC
  Administered 2014-06-10: 60 ug via SUBCUTANEOUS

## 2014-06-17 ENCOUNTER — Encounter (HOSPITAL_COMMUNITY)
Admission: RE | Admit: 2014-06-17 | Discharge: 2014-06-17 | Disposition: A | Discharge status: Home / Self Care | Payer: Medicaid Other | Source: Ambulatory Visit | Attending: Nephrology | Admitting: Nephrology

## 2014-06-17 DIAGNOSIS — N183 Chronic kidney disease, stage 3 unspecified: Secondary | ICD-10-CM | POA: Diagnosis not present

## 2014-06-17 DIAGNOSIS — D638 Anemia in other chronic diseases classified elsewhere: Secondary | ICD-10-CM | POA: Diagnosis not present

## 2014-06-17 LAB — IRON AND TIBC
IRON: 66 ug/dL (ref 42–135)
Saturation Ratios: 29 % (ref 20–55)
TIBC: 228 ug/dL (ref 215–435)
UIBC: 162 ug/dL (ref 125–400)

## 2014-06-17 LAB — POCT HEMOGLOBIN-HEMACUE: Hemoglobin: 12.8 g/dL — ABNORMAL LOW (ref 13.0–17.0)

## 2014-06-17 LAB — FERRITIN: Ferritin: 253 ng/mL (ref 22–322)

## 2014-06-17 MED ORDER — DARBEPOETIN ALFA-POLYSORBATE 60 MCG/0.3ML IJ SOLN: 60.0000 ug | INTRAMUSCULAR | Status: DC

## 2014-07-01 ENCOUNTER — Encounter (HOSPITAL_COMMUNITY)
Admission: RE | Admit: 2014-07-01 | Discharge: 2014-07-01 | Disposition: A | Discharge status: Home / Self Care | Payer: Medicaid Other | Source: Ambulatory Visit | Attending: Nephrology | Admitting: Nephrology

## 2014-07-01 DIAGNOSIS — D638 Anemia in other chronic diseases classified elsewhere: Secondary | ICD-10-CM | POA: Diagnosis not present

## 2014-07-01 LAB — POCT HEMOGLOBIN-HEMACUE: Hemoglobin: 13.2 g/dL (ref 13.0–17.0)

## 2014-07-01 MED ORDER — DARBEPOETIN ALFA-POLYSORBATE 60 MCG/0.3ML IJ SOLN: 60.0000 ug | INTRAMUSCULAR | Status: DC

## 2014-07-12 ENCOUNTER — Other Ambulatory Visit: Payer: Self-pay | Admitting: Internal Medicine

## 2014-07-15 ENCOUNTER — Encounter (HOSPITAL_COMMUNITY)
Admission: RE | Admit: 2014-07-15 | Discharge: 2014-07-15 | Disposition: A | Discharge status: Home / Self Care | Payer: Medicaid Other | Source: Ambulatory Visit | Attending: Nephrology | Admitting: Nephrology

## 2014-07-15 DIAGNOSIS — N183 Chronic kidney disease, stage 3 unspecified: Secondary | ICD-10-CM | POA: Diagnosis not present

## 2014-07-15 DIAGNOSIS — D638 Anemia in other chronic diseases classified elsewhere: Secondary | ICD-10-CM | POA: Diagnosis not present

## 2014-07-15 LAB — IRON AND TIBC
Iron: 131 ug/dL (ref 42–135)
Saturation Ratios: 63 % — ABNORMAL HIGH (ref 20–55)
TIBC: 207 ug/dL — ABNORMAL LOW (ref 215–435)
UIBC: 76 ug/dL — ABNORMAL LOW (ref 125–400)

## 2014-07-15 LAB — POCT HEMOGLOBIN-HEMACUE: HEMOGLOBIN: 12.7 g/dL — AB (ref 13.0–17.0)

## 2014-07-15 LAB — FERRITIN: FERRITIN: 438 ng/mL — AB (ref 22–322)

## 2014-07-15 MED ORDER — DARBEPOETIN ALFA-POLYSORBATE 60 MCG/0.3ML IJ SOLN: 60.0000 ug | INTRAMUSCULAR | Status: DC

## 2014-07-29 ENCOUNTER — Encounter (HOSPITAL_COMMUNITY)
Admission: RE | Admit: 2014-07-29 | Discharge: 2014-07-29 | Disposition: A | Discharge status: Home / Self Care | Payer: Medicaid Other | Source: Ambulatory Visit | Attending: Nephrology | Admitting: Nephrology

## 2014-07-29 DIAGNOSIS — D638 Anemia in other chronic diseases classified elsewhere: Secondary | ICD-10-CM | POA: Diagnosis not present

## 2014-07-29 LAB — POCT HEMOGLOBIN-HEMACUE: HEMOGLOBIN: 11.8 g/dL — AB (ref 13.0–17.0)

## 2014-07-29 MED ORDER — DARBEPOETIN ALFA-POLYSORBATE 60 MCG/0.3ML IJ SOLN
INTRAMUSCULAR | Status: AC
  Filled 2014-07-29: qty 0.3

## 2014-07-29 MED ORDER — DARBEPOETIN ALFA-POLYSORBATE 60 MCG/0.3ML IJ SOLN
60.0000 ug | INTRAMUSCULAR | Status: DC
  Administered 2014-07-29: 60 ug via SUBCUTANEOUS

## 2014-08-03 ENCOUNTER — Encounter: Payer: Self-pay | Admitting: Internal Medicine

## 2014-08-03 ENCOUNTER — Ambulatory Visit (INDEPENDENT_AMBULATORY_CARE_PROVIDER_SITE_OTHER): Payer: Medicaid Other | Admitting: Internal Medicine

## 2014-08-03 VITALS — BP 156/90 | HR 68 | Ht 72.0 in | Wt 258.0 lb

## 2014-08-03 DIAGNOSIS — N189 Chronic kidney disease, unspecified: Secondary | ICD-10-CM

## 2014-08-03 DIAGNOSIS — D649 Anemia, unspecified: Secondary | ICD-10-CM

## 2014-08-03 DIAGNOSIS — Z1211 Encounter for screening for malignant neoplasm of colon: Secondary | ICD-10-CM

## 2014-08-03 MED ORDER — MOVIPREP 100 G PO SOLR: 1.0000 | Freq: Once | ORAL | Status: DC

## 2014-08-03 NOTE — Patient Instructions (Signed)
You have been scheduled for a colonoscopy. Please follow written instructions given to you at your visit today.  Please pick up your prep kit at the pharmacy within the next 1-3 days. If you use inhalers (even only as needed), please bring them with you on the day of your procedure. Your physician has requested that you go to www.startemmi.com and enter the access code given to you at your visit today. This web site gives a general overview about your procedure. However, you should still follow specific instructions given to you by our office regarding your preparation for the procedure. CC:  Deepak Advani MD  

## 2014-08-03 NOTE — Progress Notes (Signed)
Patient ID: Alexander [name], male   DOB: Dec 22, 1960, 53 y.o.   MRN: [id number] HPI: Alexander [name] is a 53 yo male with PMH of diabetes, chronic kidney disease, hypertension and hyperlipidemia who is seen in consultation at the request of Dr. Annitta Needs for screening colonoscopy in the setting of anemia. He is here today with his daughter. He reports he is feeling well. He denies change in his bowel habit. He has a bowel movement every one to 2 days. He denies significant diarrhea or constipation. No abdominal pain. He denies visible blood in his stool or melena. No change in appetite, but he reports he only eats one meal a day in an attempt to keep his glucose under control. He has gained approximately 50 pounds in the past year with diabetes management. Prior to this he was diagnosed with a diabetic foot ulcer and had vascular bypass but eventually a transmetatarsal" to that foot. He denies heartburn, dysphagia or odynophagia. No known family history of colorectal cancer or IBD. He is on permanent disability and has 2 children. He is married.  Past Medical History  Diagnosis Date  . Hypertension   . Dysrhythmia     h/o pvc's  . Arthritis   . Diabetes mellitus     dx---been  awhile  . Anemia   . Alcohol abuse   . Chronic kidney disease   . Hyperlipemia     Past Surgical History  Procedure Laterality Date  . Lumbar laminectomy  06/13/2012    Procedure: MICRODISCECTOMY LUMBAR LAMINECTOMY;  Surgeon: Jessy Oto, MD;  Location: [place];  Service: Orthopedics;  Laterality: N/A;  Central laminectomy L2-3, L3-4, L4-5  . Amputation Left 08/19/2013    Procedure: AMPUTATION RAY;  Surgeon: Alta Corning, MD;  Location: [place];  Service: Orthopedics;  Laterality: Left;  ray amputation left 5th  . Femoral-tibial bypass graft Left 10/27/2013    Procedure: BYPASS GRAFT LEFT FEMORAL- POSTERIOR TIBIAL ARTERY;  Surgeon: Angelia Mould, MD;  Location: [place];  Service: Vascular;  Laterality: Left;  . I&d extremity  Left 10/27/2013    Procedure: IRRIGATION AND DEBRIDEMENT EXTREMITY- LEFT FOOT;  Surgeon: Angelia Mould, MD;  Location: [place];  Service: Vascular;  Laterality: Left;  . Amputation Left 10/27/2013    Procedure: AMPUTATION DIGIT-LEFT 4TH TOE, 4th and 5th metatarsal.;  Surgeon: Angelia Mould, MD;  Location: [place];  Service: Vascular;  Laterality: Left;  . Amputation Left 11/04/2013    Procedure: LEFT FOOT TRANS-METATARSAL AMPUTATION WITH WOUND CLOSURE ;  Surgeon: Alta Corning, MD;  Location: [place];  Service: Orthopedics;  Laterality: Left;    Outpatient Prescriptions Prior to Visit  Medication Sig Dispense Refill  . aspirin 81 MG chewable tablet Chew 81 mg by mouth daily.      Marland Kitchen atorvastatin (LIPITOR) 10 MG tablet Take 1 tablet (10 mg total) by mouth daily.  30 tablet  5  . glipiZIDE (GLUCOTROL) 5 MG tablet Take 5 mg by mouth 2 (two) times daily.      Marland Kitchen glucose blood test strip Use as instructed  100 each  12  . hydrALAZINE (APRESOLINE) 100 MG tablet Take 100 mg by mouth 2 (two) times daily.      . metoprolol succinate (TOPROL-XL) 50 MG 24 hr tablet TAKE 3 TABLETS BY MOUTH ONCE DAILY  90 tablet  3  . Multiple Vitamins-Minerals (MULTIVITAMINS THER. W/MINERALS) TABS Take 1 tablet by mouth daily. Centrum Silver      . sitaGLIPtin (  JANUVIA) 25 MG tablet Take 1 tablet (25 mg total) by mouth daily.  90 tablet  3  . hydrALAZINE (APRESOLINE) 100 MG tablet TAKE 1 TABLET BY MOUTH EVERY 8 HOURS  90 tablet  1  . metoprolol succinate (TOPROL-XL) 50 MG 24 hr tablet Take 150 mg by mouth daily. Take with or immediately following a meal.       No facility-administered medications prior to visit.    Allergies  Allergen Reactions  . Sulfa Antibiotics Rash    Family History  Problem Relation Age of Onset  . Diabetes type II Mother   . Hypertension Mother   . Liver cancer Father   . Diabetes type II Sister   . Diabetes type II Brother   . Kidney failure Brother   . Diabetes type II  Sister     History  Substance Use Topics  . Smoking status: Current Some Day Smoker -- 1.50 packs/day for 30 years    Types: Cigarettes, Cigars  . Smokeless tobacco: Never Used  . Alcohol Use: 21.0 oz/week    35 Cans of beer per week     Comment: 6 of 12 oz beers a day    ROS: As per history of present illness, otherwise negative  BP 156/90  Pulse 68  Ht 6' (1.829 m)  Wt 258 lb (117.028 kg)  BMI 34.98 kg/m2 Constitutional: Well-developed and well-nourished. No distress. HEENT: Normocephalic and atraumatic. Oropharynx is clear and moist. No oropharyngeal exudate. Conjunctivae are normal.  No scleral icterus. Neck: Neck supple. Trachea midline. Cardiovascular: Normal rate, regular rhythm and intact distal pulses. No M/R/G Pulmonary/chest: Effort normal and breath sounds normal. No wheezing, rales or rhonchi. Abdominal: Soft, nontender, nondistended. Bowel sounds active throughout. Extremities: no clubbing, cyanosis, or edema Neurological: Alert and oriented to person place and time. Psychiatric: Normal mood and affect. Behavior is normal.  RELEVANT LABS AND IMAGING: CBC    Component Value Date/Time   WBC 7.7 05/01/2014 0315   RBC 3.68* 05/01/2014 0315   RBC 3.32* 10/25/2013 1636   HGB 11.8* 07/29/2014 0859   HCT 32.0* 05/01/2014 0315   PLT 192 05/01/2014 0315   MCV 87.0 05/01/2014 0315   MCH 29.6 05/01/2014 0315   MCHC 34.1 05/01/2014 0315   RDW 13.8 05/01/2014 0315   LYMPHSABS 2.4 11/20/2013 1422   MONOABS 0.7 11/20/2013 1422   EOSABS 1.1* 11/20/2013 1422   BASOSABS 0.1 11/20/2013 1422    CMP     Component Value Date/Time   NA 134* 05/01/2014 0315   K 4.9 05/01/2014 0315   CL 97 05/01/2014 0315   CO2 21 05/01/2014 0315   GLUCOSE 103* 05/01/2014 0315   BUN 20 05/01/2014 0315   CREATININE 1.23 05/01/2014 0315   CREATININE 2.42* 11/20/2013 1422   CALCIUM 9.1 05/01/2014 0315   PROT 6.5 05/01/2014 0315   ALBUMIN 3.2* 05/01/2014 0315   AST 35 05/01/2014 0315   ALT 55* 05/01/2014 0315    ALKPHOS 79 05/01/2014 0315   BILITOT 0.7 05/01/2014 0315   GFRNONAA 64* 05/01/2014 0315   GFRNONAA 29* 11/20/2013 1422   GFRAA 75* 05/01/2014 0315   GFRAA 34* 11/20/2013 1422   Iron/TIBC/Ferritin/ %Sat    Component Value Date/Time   IRON 131 07/15/2014 0854   TIBC 207* 07/15/2014 0854   FERRITIN 438* 07/15/2014 0854   IRONPCTSAT 63* 07/15/2014 0854    ASSESSMENT/PLAN:  53 yo male with PMH of diabetes, chronic kidney disease, hypertension and hyperlipidemia who is seen in consultation at  the request of Dr. Annitta Needs for screening colonoscopy in the setting of anemia.  1. CRC screening -- I have recommended colonoscopy for screening. He is 53 years old and never had a colonoscopy. We discussed the test today including the risks and benefits and he is agreeable to proceed.   2. Normocytic anemia -- his anemia is likely that of chronic disease related to chronic kidney disease. His iron studies are mixed and not indicative of iron deficiency. My suspicion of GI blood loss is very low. Therefore I am not performing upper endoscopy or further GI evaluation at this time given the lack of iron deficiency.

## 2014-08-05 ENCOUNTER — Telehealth: Payer: Self-pay | Admitting: Internal Medicine

## 2014-08-05 ENCOUNTER — Encounter (HOSPITAL_COMMUNITY)
Admission: RE | Admit: 2014-08-05 | Discharge: 2014-08-05 | Disposition: A | Discharge status: Home / Self Care | Payer: Medicaid Other | Source: Ambulatory Visit | Attending: Nephrology | Admitting: Nephrology

## 2014-08-05 DIAGNOSIS — D638 Anemia in other chronic diseases classified elsewhere: Secondary | ICD-10-CM | POA: Diagnosis not present

## 2014-08-05 LAB — POCT HEMOGLOBIN-HEMACUE: Hemoglobin: 11.1 g/dL — ABNORMAL LOW (ref 13.0–17.0)

## 2014-08-05 MED ORDER — DARBEPOETIN ALFA-POLYSORBATE 60 MCG/0.3ML IJ SOLN
60.0000 ug | INTRAMUSCULAR | Status: DC
  Administered 2014-08-05: 60 ug via SUBCUTANEOUS

## 2014-08-05 MED ORDER — DARBEPOETIN ALFA-POLYSORBATE 60 MCG/0.3ML IJ SOLN
INTRAMUSCULAR | Status: AC
  Filled 2014-08-05: qty 0.3

## 2014-08-05 NOTE — Telephone Encounter (Signed)
Per Dr. Linde [name] is fine and not a risk for kidney.

## 2014-08-12 ENCOUNTER — Encounter (HOSPITAL_COMMUNITY)
Admission: RE | Admit: 2014-08-12 | Discharge: 2014-08-12 | Disposition: A | Discharge status: Home / Self Care | Payer: Medicaid Other | Source: Ambulatory Visit | Attending: Nephrology | Admitting: Nephrology

## 2014-08-12 DIAGNOSIS — N183 Chronic kidney disease, stage 3 (moderate): Secondary | ICD-10-CM | POA: Insufficient documentation

## 2014-08-12 DIAGNOSIS — D631 Anemia in chronic kidney disease: Secondary | ICD-10-CM | POA: Diagnosis not present

## 2014-08-12 LAB — POCT HEMOGLOBIN-HEMACUE: HEMOGLOBIN: 12 g/dL — AB (ref 13.0–17.0)

## 2014-08-12 LAB — IRON AND TIBC
Iron: 79 ug/dL (ref 42–135)
Saturation Ratios: 33 % (ref 20–55)
TIBC: 240 ug/dL (ref 215–435)
UIBC: 161 ug/dL (ref 125–400)

## 2014-08-12 LAB — FERRITIN: Ferritin: 353 ng/mL — ABNORMAL HIGH (ref 22–322)

## 2014-08-12 MED ORDER — DARBEPOETIN ALFA-POLYSORBATE 60 MCG/0.3ML IJ SOLN: 60.0000 ug | INTRAMUSCULAR | Status: DC

## 2014-08-26 ENCOUNTER — Encounter (HOSPITAL_COMMUNITY)
Admission: RE | Admit: 2014-08-26 | Discharge: 2014-08-26 | Disposition: A | Discharge status: Home / Self Care | Payer: Medicaid Other | Source: Ambulatory Visit | Attending: Nephrology | Admitting: Nephrology

## 2014-08-26 DIAGNOSIS — D631 Anemia in chronic kidney disease: Secondary | ICD-10-CM | POA: Diagnosis not present

## 2014-08-26 LAB — POCT HEMOGLOBIN-HEMACUE: HEMOGLOBIN: 11.4 g/dL — AB (ref 13.0–17.0)

## 2014-08-26 MED ORDER — DARBEPOETIN ALFA-POLYSORBATE 60 MCG/0.3ML IJ SOLN
60.0000 ug | INTRAMUSCULAR | Status: DC
  Administered 2014-08-26: 60 ug via SUBCUTANEOUS

## 2014-08-26 MED ORDER — DARBEPOETIN ALFA-POLYSORBATE 60 MCG/0.3ML IJ SOLN
INTRAMUSCULAR | Status: AC
  Filled 2014-08-26: qty 0.3

## 2014-09-01 ENCOUNTER — Other Ambulatory Visit: Payer: Self-pay | Admitting: Internal Medicine

## 2014-09-02 ENCOUNTER — Encounter (HOSPITAL_COMMUNITY)
Admission: RE | Admit: 2014-09-02 | Discharge: 2014-09-02 | Disposition: A | Discharge status: Home / Self Care | Payer: Medicaid Other | Source: Ambulatory Visit | Attending: Nephrology | Admitting: Nephrology

## 2014-09-02 DIAGNOSIS — D631 Anemia in chronic kidney disease: Secondary | ICD-10-CM | POA: Diagnosis not present

## 2014-09-02 LAB — POCT HEMOGLOBIN-HEMACUE: HEMOGLOBIN: 11.9 g/dL — AB (ref 13.0–17.0)

## 2014-09-02 MED ORDER — DARBEPOETIN ALFA-POLYSORBATE 60 MCG/0.3ML IJ SOLN
60.0000 ug | INTRAMUSCULAR | Status: DC
  Administered 2014-09-02: 60 ug via SUBCUTANEOUS

## 2014-09-02 MED ORDER — DARBEPOETIN ALFA-POLYSORBATE 60 MCG/0.3ML IJ SOLN
INTRAMUSCULAR | Status: AC
  Filled 2014-09-02: qty 0.3

## 2014-09-06 ENCOUNTER — Ambulatory Visit: Payer: Medicaid Other | Attending: Internal Medicine

## 2014-09-06 DIAGNOSIS — Z23 Encounter for immunization: Secondary | ICD-10-CM

## 2014-09-09 ENCOUNTER — Encounter (HOSPITAL_COMMUNITY)
Admission: RE | Admit: 2014-09-09 | Discharge: 2014-09-09 | Disposition: A | Discharge status: Home / Self Care | Payer: Medicaid Other | Source: Ambulatory Visit | Attending: Nephrology | Admitting: Nephrology

## 2014-09-09 DIAGNOSIS — D631 Anemia in chronic kidney disease: Secondary | ICD-10-CM | POA: Diagnosis not present

## 2014-09-09 LAB — IRON AND TIBC
Iron: 73 ug/dL (ref 42–135)
SATURATION RATIOS: 35 % (ref 20–55)
TIBC: 211 ug/dL — AB (ref 215–435)
UIBC: 138 ug/dL (ref 125–400)

## 2014-09-09 LAB — FERRITIN: Ferritin: 294 ng/mL (ref 22–322)

## 2014-09-09 LAB — POCT HEMOGLOBIN-HEMACUE: Hemoglobin: 12.2 g/dL — ABNORMAL LOW (ref 13.0–17.0)

## 2014-09-09 MED ORDER — DARBEPOETIN ALFA-POLYSORBATE 60 MCG/0.3ML IJ SOLN: 60.0000 ug | INTRAMUSCULAR | Status: DC

## 2014-09-22 ENCOUNTER — Ambulatory Visit: Payer: PRIVATE HEALTH INSURANCE | Admitting: Vascular Surgery

## 2014-09-22 ENCOUNTER — Encounter (HOSPITAL_COMMUNITY): Payer: PRIVATE HEALTH INSURANCE

## 2014-09-22 ENCOUNTER — Other Ambulatory Visit (HOSPITAL_COMMUNITY): Payer: PRIVATE HEALTH INSURANCE

## 2014-09-23 ENCOUNTER — Encounter (HOSPITAL_COMMUNITY)
Admission: RE | Admit: 2014-09-23 | Discharge: 2014-09-23 | Disposition: A | Discharge status: Home / Self Care | Payer: Medicaid Other | Source: Ambulatory Visit | Attending: Nephrology | Admitting: Nephrology

## 2014-09-23 DIAGNOSIS — N183 Chronic kidney disease, stage 3 (moderate): Secondary | ICD-10-CM | POA: Insufficient documentation

## 2014-09-23 DIAGNOSIS — D631 Anemia in chronic kidney disease: Secondary | ICD-10-CM | POA: Insufficient documentation

## 2014-09-23 LAB — POCT HEMOGLOBIN-HEMACUE: HEMOGLOBIN: 11.6 g/dL — AB (ref 13.0–17.0)

## 2014-09-23 MED ORDER — DARBEPOETIN ALFA 60 MCG/0.3ML IJ SOSY
PREFILLED_SYRINGE | INTRAMUSCULAR | Status: AC
  Filled 2014-09-23: qty 0.3

## 2014-09-23 MED ORDER — DARBEPOETIN ALFA 60 MCG/0.3ML IJ SOSY
60.0000 ug | PREFILLED_SYRINGE | INTRAMUSCULAR | Status: DC
  Administered 2014-09-23: 60 ug via SUBCUTANEOUS

## 2014-09-28 ENCOUNTER — Encounter: Payer: Self-pay | Admitting: Vascular Surgery

## 2014-09-28 ENCOUNTER — Ambulatory Visit (AMBULATORY_SURGERY_CENTER): Payer: Medicaid Other | Admitting: Internal Medicine

## 2014-09-28 ENCOUNTER — Encounter: Payer: Self-pay | Admitting: Internal Medicine

## 2014-09-28 VITALS — BP 185/76 | HR 64 | Temp 97.7°F | Resp 16 | Ht 72.0 in | Wt 258.0 lb

## 2014-09-28 DIAGNOSIS — D125 Benign neoplasm of sigmoid colon: Secondary | ICD-10-CM

## 2014-09-28 DIAGNOSIS — D122 Benign neoplasm of ascending colon: Secondary | ICD-10-CM

## 2014-09-28 DIAGNOSIS — D123 Benign neoplasm of transverse colon: Secondary | ICD-10-CM

## 2014-09-28 DIAGNOSIS — Z1211 Encounter for screening for malignant neoplasm of colon: Secondary | ICD-10-CM

## 2014-09-28 LAB — GLUCOSE, CAPILLARY
GLUCOSE-CAPILLARY: 276 mg/dL — AB (ref 70–99)
Glucose-Capillary: 301 mg/dL — ABNORMAL HIGH (ref 70–99)

## 2014-09-28 MED ORDER — SODIUM CHLORIDE 0.9 % IV SOLN: 500.0000 mL | INTRAVENOUS | Status: DC

## 2014-09-28 NOTE — Progress Notes (Signed)
Report to PACU, RN, vss, BBS= Clear.  

## 2014-09-28 NOTE — Progress Notes (Signed)
Called to room to assist during endoscopic procedure.  Patient ID and intended procedure confirmed with present staff. Received instructions for my participation in the procedure from the performing physician.  

## 2014-09-28 NOTE — Progress Notes (Signed)
Pt. Stated that he has been out of glucotrol for diabetes, has not had in about 3 weeks according to pt. Staff in room notified of CBG.

## 2014-09-28 NOTE — Patient Instructions (Signed)

## 2014-09-28 NOTE — Op Note (Signed)
[facility]  [address] [place], [id number]   COLONOSCOPY PROCEDURE REPORT  PATIENT: Alexander [name], Alexander [name]  MR#: [id number] BIRTHDATE: 1960/11/26 , 1  yrs. old GENDER: male ENDOSCOPIST: Jerene Bears, MD REFERRED [id number] Advani, MD PROCEDURE DATE:  09/28/2014 PROCEDURE:   Colonoscopy with snare polypectomy First Screening Colonoscopy - Avg.  risk and is 50 yrs.  old or older Yes.  Prior Negative Screening - Now for repeat screening. N/A  History of Adenoma - Now for follow-up colonoscopy & has been > or = to 3 yrs.  N/A  Polyps Removed Today? Yes. ASA CLASS:   Class II INDICATIONS:average risk for colorectal cancer and first colonoscopy. MEDICATIONS: Monitored anesthesia care and Propofol 340 mg IV  DESCRIPTION OF PROCEDURE:   After the risks benefits and alternatives of the procedure were thoroughly explained, informed consent was obtained.  The digital rectal exam revealed no rectal mass.   The LB [id number]  endoscope was introduced through the anus and advanced to the cecum, which was identified by both the appendix and ileocecal valve. No adverse events experienced. The quality of the prep was excellent, using MoviPrep  The instrument was then slowly withdrawn as the colon was fully examined.   COLON FINDINGS: Four sessile polyps ranging from 4 to 59mm in size were found in the ascending colon (1), transverse colon (1), and sigmoid colon (2).  Polypectomies were performed with a cold snare. The resection was complete, the polyp tissue was completely retrieved and sent to histology.   The examination was otherwise normal.  Retroflexed views revealed no abnormalities. The time to cecum=4 minutes 17 seconds.  Withdrawal time=14 minutes 29 seconds. The scope was withdrawn and the procedure completed. COMPLICATIONS: There were no immediate complications.  ENDOSCOPIC IMPRESSION: 1.   Four sessile polyps ranging from 4 to 39mm in size were found in the  ascending colon, transverse colon, and sigmoid colon; polypectomies were performed with a cold snare 2.   The examination was otherwise normal  RECOMMENDATIONS: 1.  Avoid all NSAIDS for the next 2 weeks. 2.  Await pathology results 3.  If the polyps removed today are proven to be adenomatous (pre-cancerous) polyps, you will need a colonoscopy in 3 years. Otherwise you should continue to follow colorectal cancer screening guidelines for "routine risk" patients with a colonoscopy in 10 years.  You will receive a letter within 1-2 weeks with the results of your biopsy as well as final recommendations.  Please call my office if you have not received a letter after 3 weeks.  eSigned:  Jerene Bears, MD 09/28/2014 2:51 PM cc:  The Patient, PCP

## 2014-09-29 ENCOUNTER — Ambulatory Visit (HOSPITAL_COMMUNITY)
Admission: RE | Admit: 2014-09-29 | Discharge: 2014-09-29 | Disposition: A | Discharge status: Home / Self Care | Payer: Medicaid Other | Source: Ambulatory Visit | Attending: Vascular Surgery | Admitting: Vascular Surgery

## 2014-09-29 ENCOUNTER — Ambulatory Visit (INDEPENDENT_AMBULATORY_CARE_PROVIDER_SITE_OTHER)
Admission: RE | Admit: 2014-09-29 | Discharge: 2014-09-29 | Disposition: A | Discharge status: Home / Self Care | Payer: Medicaid Other | Source: Ambulatory Visit | Attending: Vascular Surgery | Admitting: Vascular Surgery

## 2014-09-29 ENCOUNTER — Encounter: Payer: Self-pay | Admitting: Family

## 2014-09-29 ENCOUNTER — Ambulatory Visit (INDEPENDENT_AMBULATORY_CARE_PROVIDER_SITE_OTHER): Payer: Medicaid Other | Admitting: Family

## 2014-09-29 ENCOUNTER — Telehealth: Payer: Self-pay | Admitting: *Deleted

## 2014-09-29 ENCOUNTER — Other Ambulatory Visit: Payer: Self-pay

## 2014-09-29 VITALS — BP 160/76 | HR 68 | Resp 16 | Ht 72.0 in | Wt 253.0 lb

## 2014-09-29 DIAGNOSIS — I70269 Atherosclerosis of native arteries of extremities with gangrene, unspecified extremity: Secondary | ICD-10-CM

## 2014-09-29 DIAGNOSIS — Z48812 Encounter for surgical aftercare following surgery on the circulatory system: Secondary | ICD-10-CM

## 2014-09-29 DIAGNOSIS — I739 Peripheral vascular disease, unspecified: Secondary | ICD-10-CM

## 2014-09-29 NOTE — Patient Instructions (Addendum)
Peripheral Vascular Disease Peripheral Vascular Disease (PVD), also called Peripheral Arterial Disease (PAD), is a circulation problem caused by cholesterol (atherosclerotic plaque) deposits in the arteries. PVD commonly occurs in the lower extremities (legs) but it can occur in other areas of the body, such as your arms. The cholesterol buildup in the arteries reduces blood flow which can cause pain and other serious problems. The presence of PVD can place a person at risk for Coronary Artery Disease (CAD).  CAUSES  Causes of PVD can be many. It is usually associated with more than one risk factor such as:   High Cholesterol.  Smoking.  Diabetes.  Lack of exercise or inactivity.  High blood pressure (hypertension).  Obesity.  Family history. SYMPTOMS   When the lower extremities are affected, patients with PVD may experience:  Leg pain with exertion or physical activity. This is called INTERMITTENT CLAUDICATION. This may present as cramping or numbness with physical activity. The location of the pain is associated with the level of blockage. For example, blockage at the abdominal level (distal abdominal aorta) may result in buttock or hip pain. Lower leg arterial blockage may result in calf pain.  As PVD becomes more severe, pain can develop with less physical activity.  In people with severe PVD, leg pain may occur at rest.  Other PVD signs and symptoms:  Leg numbness or weakness.  Coldness in the affected leg or foot, especially when compared to the other leg.  A change in leg color.  Patients with significant PVD are more prone to ulcers or sores on toes, feet or legs. These may take longer to heal or may reoccur. The ulcers or sores can become infected.  If signs and symptoms of PVD are ignored, gangrene may occur. This can result in the loss of toes or loss of an entire limb.  Not all leg pain is related to PVD. Other medical conditions can cause leg pain such  as:  Blood clots (embolism) or Deep Vein Thrombosis.  Inflammation of the blood vessels (vasculitis).  Spinal stenosis. DIAGNOSIS  Diagnosis of PVD can involve several different types of tests. These can include:  Pulse Volume Recording Method (PVR). This test is simple, painless and does not involve the use of X-rays. PVR involves measuring and comparing the blood pressure in the arms and legs. An ABI (Ankle-Brachial Index) is calculated. The normal ratio of blood pressures is 1. As this number becomes smaller, it indicates more severe disease.  < 0.95 - indicates significant narrowing in one or more leg vessels.  <0.8 - there will usually be pain in the foot, leg or buttock with exercise.  <0.4 - will usually have pain in the legs at rest.  <0.25 - usually indicates limb threatening PVD.  Doppler detection of pulses in the legs. This test is painless and checks to see if you have a pulses in your legs/feet.  A dye or contrast material (a substance that highlights the blood vessels so they show up on x-ray) may be given to help your caregiver better see the arteries for the following tests. The dye is eliminated from your body by the kidney's. Your caregiver may order blood work to check your kidney function and other laboratory values before the following tests are performed:  Magnetic Resonance Angiography (MRA). An MRA is a picture study of the blood vessels and arteries. The MRA machine uses a large magnet to produce images of the blood vessels.  Computed Tomography Angiography (CTA). A CTA   is a specialized x-ray that looks at how the blood flows in your blood vessels. An IV may be inserted into your arm so contrast dye can be injected.  Angiogram. Is a procedure that uses x-rays to look at your blood vessels. This procedure is minimally invasive, meaning a small incision (cut) is made in your groin. A small tube (catheter) is then inserted into the artery of your groin. The catheter  is guided to the blood vessel or artery your caregiver wants to examine. Contrast dye is injected into the catheter. X-rays are then taken of the blood vessel or artery. After the images are obtained, the catheter is taken out. TREATMENT  Treatment of PVD involves many interventions which may include:  Lifestyle changes:  Quitting smoking.  Exercise.  Following a low fat, low cholesterol diet.  Control of diabetes.  Foot care is very important to the PVD patient. Good foot care can help prevent infection.  Medication:  Cholesterol-lowering medicine.  Blood pressure medicine.  Anti-platelet drugs.  Certain medicines may reduce symptoms of Intermittent Claudication.  Interventional/Surgical options:  Angioplasty. An Angioplasty is a procedure that inflates a balloon in the blocked artery. This opens the blocked artery to improve blood flow.  Stent Implant. A wire mesh tube (stent) is placed in the artery. The stent expands and stays in place, allowing the artery to remain open.  Peripheral Bypass Surgery. This is a surgical procedure that reroutes the blood around a blocked artery to help improve blood flow. This type of procedure may be performed if Angioplasty or stent implants are not an option. SEEK IMMEDIATE MEDICAL CARE IF:   You develop pain or numbness in your arms or legs.  Your arm or leg turns cold, becomes blue in color.  You develop redness, warmth, swelling and pain in your arms or legs. MAKE SURE YOU:   Understand these instructions.  Will watch your condition.  Will get help right away if you are not doing well or get worse. Document Released: 12/06/2004 Document Revised: 01/21/2012 Document Reviewed: 11/02/2008 Exit[employer] Patient Information 2015 [employer]. This information is not intended to replace advice given to you by your health care provider. Make sure you discuss any questions you have with your health care provider.    Smoking  Cessation Quitting smoking is important to your health and has many advantages. However, it is not always easy to quit since nicotine is a very addictive drug. Oftentimes, people try 3 times or more before being able to quit. This document explains the best ways for you to prepare to quit smoking. Quitting takes hard work and a lot of effort, but you can do it. ADVANTAGES OF QUITTING SMOKING  You will live longer, feel better, and live better.  Your body will feel the impact of quitting smoking almost immediately.  Within 20 minutes, blood pressure decreases. Your pulse returns to its normal level.  After 8 hours, carbon monoxide levels in the blood return to normal. Your oxygen level increases.  After 24 hours, the chance of having a heart attack starts to decrease. Your breath, hair, and body stop smelling like smoke.  After 48 hours, damaged nerve endings begin to recover. Your sense of taste and smell improve.  After 72 hours, the body is virtually free of nicotine. Your bronchial tubes relax and breathing becomes easier.  After 2 to 12 weeks, lungs can hold more air. Exercise becomes easier and circulation improves.  The risk of having a heart attack, stroke,   cancer, or lung disease is greatly reduced.  After 1 year, the risk of coronary heart disease is cut in half.  After 5 years, the risk of stroke falls to the same as a nonsmoker.  After 10 years, the risk of lung cancer is cut in half and the risk of other cancers decreases significantly.  After 15 years, the risk of coronary heart disease drops, usually to the level of a nonsmoker.  If you are pregnant, quitting smoking will improve your chances of having a healthy baby.  The people you live with, especially any children, will be healthier.  You will have extra money to spend on things other than cigarettes. QUESTIONS TO THINK ABOUT BEFORE ATTEMPTING TO QUIT You may want to talk about your answers with your health care  provider.  Why do you want to quit?  If you tried to quit in the past, what helped and what did not?  What will be the most difficult situations for you after you quit? How will you plan to handle them?  Who can help you through the tough times? Your family? Friends? A health care provider?  What pleasures do you get from smoking? What ways can you still get pleasure if you quit? Here are some questions to ask your health care provider:  How can you help me to be successful at quitting?  What medicine do you think would be best for me and how should I take it?  What should I do if I need more help?  What is smoking withdrawal like? How can I get information on withdrawal? GET READY  Set a quit date.  Change your environment by getting rid of all cigarettes, ashtrays, matches, and lighters in your home, car, or work. Do not let people smoke in your home.  Review your past attempts to quit. Think about what worked and what did not. GET SUPPORT AND ENCOURAGEMENT You have a better chance of being successful if you have help. You can get support in many ways.  Tell your family, friends, and coworkers that you are going to quit and need their support. Ask them not to smoke around you.  Get individual, group, or telephone counseling and support. Programs are available at local hospitals and health centers. Call your local health department for information about programs in your area.  Spiritual beliefs and practices may help some smokers quit.  Download a "quit meter" on your computer to keep track of quit statistics, such as how long you have gone without smoking, cigarettes not smoked, and money saved.  Get a self-help book about quitting smoking and staying off tobacco. LEARN NEW SKILLS AND BEHAVIORS  Distract yourself from urges to smoke. Talk to someone, go for a walk, or occupy your time with a task.  Change your normal routine. Take a different route to work. Drink tea  instead of coffee. Eat breakfast in a different place.  Reduce your stress. Take a hot bath, exercise, or read a book.  Plan something enjoyable to do every day. Reward yourself for not smoking.  Explore interactive web-based programs that specialize in helping you quit. GET MEDICINE AND USE IT CORRECTLY Medicines can help you stop smoking and decrease the urge to smoke. Combining medicine with the above behavioral methods and support can greatly increase your chances of successfully quitting smoking.  Nicotine replacement therapy helps deliver nicotine to your body without the negative effects and risks of smoking. Nicotine replacement therapy includes nicotine gum, lozenges,   inhalers, nasal sprays, and skin patches. Some may be available over-the-counter and others require a prescription.  Antidepressant medicine helps people abstain from smoking, but how this works is unknown. This medicine is available by prescription.  Nicotinic receptor partial agonist medicine simulates the effect of nicotine in your brain. This medicine is available by prescription. Ask your health care provider for advice about which medicines to use and how to use them based on your health history. Your health care provider will tell you what side effects to look out for if you choose to be on a medicine or therapy. Carefully read the information on the package. Do not use any other product containing nicotine while using a nicotine replacement product.  RELAPSE OR DIFFICULT SITUATIONS Most relapses occur within the first 3 months after quitting. Do not be discouraged if you start smoking again. Remember, most people try several times before finally quitting. You may have symptoms of withdrawal because your body is used to nicotine. You may crave cigarettes, be irritable, feel very hungry, cough often, get headaches, or have difficulty concentrating. The withdrawal symptoms are only temporary. They are strongest when you  first quit, but they will go away within 10-14 days. To reduce the chances of relapse, try to:  Avoid drinking alcohol. Drinking lowers your chances of successfully quitting.  Reduce the amount of caffeine you consume. Once you quit smoking, the amount of caffeine in your body increases and can give you symptoms, such as a rapid heartbeat, sweating, and anxiety.  Avoid smokers because they can make you want to smoke.  Do not let weight gain distract you. Many smokers will gain weight when they quit, usually less than 10 pounds. Eat a healthy diet and stay active. You can always lose the weight gained after you quit.  Find ways to improve your mood other than smoking. FOR MORE INFORMATION  www.smokefree.gov  Document Released: 10/23/2001 Document Revised: 03/15/2014 Document Reviewed: 02/07/2012 [employer] Patient Information 2015 [employer]. This information is not intended to replace advice given to you by your health care provider. Make sure you discuss any questions you have with your health care provider.    Smoking Cessation, Tips for Success If you are ready to quit smoking, congratulations! You have chosen to help yourself be healthier. Cigarettes bring nicotine, tar, carbon monoxide, and other irritants into your body. Your lungs, heart, and blood vessels will be able to work better without these poisons. There are many different ways to quit smoking. Nicotine gum, nicotine patches, a nicotine inhaler, or nicotine nasal spray can help with physical craving. Hypnosis, support groups, and medicines help break the habit of smoking. WHAT THINGS CAN I DO TO MAKE QUITTING EASIER?  Here are some tips to help you quit for good:  Pick a date when you will quit smoking completely. Tell all of your friends and family about your plan to quit on that date.  Do not try to slowly cut down on the number of cigarettes you are smoking. Pick a quit date and quit smoking completely starting on that  day.  Throw away all cigarettes.   Clean and remove all ashtrays from your home, work, and car.  On a card, write down your reasons for quitting. Carry the card with you and read it when you get the urge to smoke.  Cleanse your body of nicotine. Drink enough water and fluids to keep your urine clear or pale yellow. Do this after quitting to flush the nicotine from   your body.  Learn to predict your moods. Do not let a bad situation be your excuse to have a cigarette. Some situations in your life might tempt you into wanting a cigarette.  Never have "just one" cigarette. It leads to wanting another and another. Remind yourself of your decision to quit.  Change habits associated with smoking. If you smoked while driving or when feeling stressed, try other activities to replace smoking. Stand up when drinking your coffee. Brush your teeth after eating. Sit in a different chair when you read the paper. Avoid alcohol while trying to quit, and try to drink fewer caffeinated beverages. Alcohol and caffeine may urge you to smoke.  Avoid foods and drinks that can trigger a desire to smoke, such as sugary or spicy foods and alcohol.  Ask people who smoke not to smoke around you.  Have something planned to do right after eating or having a cup of coffee. For example, plan to take a walk or exercise.  Try a relaxation exercise to calm you down and decrease your stress. Remember, you may be tense and nervous for the first 2 weeks after you quit, but this will pass.  Find new activities to keep your hands busy. Play with a pen, coin, or rubber band. Doodle or draw things on paper.  Brush your teeth right after eating. This will help cut down on the craving for the taste of tobacco after meals. You can also try mouthwash.   Use oral substitutes in place of cigarettes. Try using lemon drops, carrots, cinnamon sticks, or chewing gum. Keep them handy so they are available when you have the urge to  smoke.  When you have the urge to smoke, try deep breathing.  Designate your home as a nonsmoking area.  If you are a heavy smoker, ask your health care provider about a prescription for nicotine chewing gum. It can ease your withdrawal from nicotine.  Reward yourself. Set aside the cigarette money you save and buy yourself something nice.  Look for support from others. Join a support group or smoking cessation program. Ask someone at home or at work to help you with your plan to quit smoking.  Always ask yourself, "Do I need this cigarette or is this just a reflex?" Tell yourself, "Today, I choose not to smoke," or "I do not want to smoke." You are reminding yourself of your decision to quit.  Do not replace cigarette smoking with electronic cigarettes (commonly called e-cigarettes). The safety of e-cigarettes is unknown, and some may contain harmful chemicals.  If you relapse, do not give up! Plan ahead and think about what you will do the next time you get the urge to smoke. HOW WILL I FEEL WHEN I QUIT SMOKING? You may have symptoms of withdrawal because your body is used to nicotine (the addictive substance in cigarettes). You may crave cigarettes, be irritable, feel very hungry, cough often, get headaches, or have difficulty concentrating. The withdrawal symptoms are only temporary. They are strongest when you first quit but will go away within 10-14 days. When withdrawal symptoms occur, stay in control. Think about your reasons for quitting. Remind yourself that these are signs that your body is healing and getting used to being without cigarettes. Remember that withdrawal symptoms are easier to treat than the major diseases that smoking can cause.  Even after the withdrawal is over, expect periodic urges to smoke. However, these cravings are generally short lived and will go away whether you   smoke or not. Do not smoke! WHAT RESOURCES ARE AVAILABLE TO HELP ME QUIT SMOKING? Your health care  provider can direct you to community resources or hospitals for support, which may include:  Group support.  Education.  Hypnosis.  Therapy. Document Released: 07/27/2004 Document Revised: 03/15/2014 Document Reviewed: 04/16/2013 [employer] Patient Information 2015 [employer]. This information is not intended to replace advice given to you by your health care provider. Make sure you discuss any questions you have with your health care provider.  

## 2014-09-29 NOTE — Telephone Encounter (Signed)
  Follow up Call-  Call back number 09/28/2014  Post procedure Call Back phone  # [phone]  Permission to leave phone message No     Patient questions:  Do you have a fever, pain , or abdominal swelling? No. Pain Score  0 *  Have you tolerated food without any problems? Yes.    Have you been able to return to your normal activities? Yes.    Do you have any questions about your discharge instructions: Diet   No. Medications  No. Follow up visit  No.  Do you have questions or concerns about your Care? No.  Actions: * If pain score is 4 or above: No action needed, pain <4.
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
## 2014-09-30 ENCOUNTER — Encounter (HOSPITAL_COMMUNITY)
Admission: RE | Admit: 2014-09-30 | Discharge: 2014-09-30 | Disposition: A | Discharge status: Home / Self Care | Payer: Medicaid Other | Source: Ambulatory Visit | Attending: Nephrology | Admitting: Nephrology

## 2014-09-30 ENCOUNTER — Encounter (HOSPITAL_COMMUNITY): Payer: Self-pay | Admitting: Emergency Medicine

## 2014-09-30 ENCOUNTER — Emergency Department (HOSPITAL_COMMUNITY)
Admission: EM | Admit: 2014-09-30 | Discharge: 2014-09-30 | Disposition: A | Discharge status: Home / Self Care | Payer: Medicaid Other | Attending: Emergency Medicine | Admitting: Emergency Medicine

## 2014-09-30 DIAGNOSIS — Z79899 Other long term (current) drug therapy: Secondary | ICD-10-CM | POA: Diagnosis not present

## 2014-09-30 DIAGNOSIS — E785 Hyperlipidemia, unspecified: Secondary | ICD-10-CM | POA: Diagnosis not present

## 2014-09-30 DIAGNOSIS — M1990 Unspecified osteoarthritis, unspecified site: Secondary | ICD-10-CM | POA: Diagnosis not present

## 2014-09-30 DIAGNOSIS — N183 Chronic kidney disease, stage 3 (moderate): Secondary | ICD-10-CM | POA: Insufficient documentation

## 2014-09-30 DIAGNOSIS — E1165 Type 2 diabetes mellitus with hyperglycemia: Secondary | ICD-10-CM | POA: Insufficient documentation

## 2014-09-30 DIAGNOSIS — R739 Hyperglycemia, unspecified: Secondary | ICD-10-CM

## 2014-09-30 DIAGNOSIS — Z720 Tobacco use: Secondary | ICD-10-CM | POA: Diagnosis not present

## 2014-09-30 DIAGNOSIS — Z862 Personal history of diseases of the blood and blood-forming organs and certain disorders involving the immune mechanism: Secondary | ICD-10-CM | POA: Insufficient documentation

## 2014-09-30 DIAGNOSIS — I129 Hypertensive chronic kidney disease with stage 1 through stage 4 chronic kidney disease, or unspecified chronic kidney disease: Secondary | ICD-10-CM | POA: Insufficient documentation

## 2014-09-30 DIAGNOSIS — D631 Anemia in chronic kidney disease: Secondary | ICD-10-CM | POA: Insufficient documentation

## 2014-09-30 DIAGNOSIS — Z8669 Personal history of other diseases of the nervous system and sense organs: Secondary | ICD-10-CM | POA: Diagnosis not present

## 2014-09-30 DIAGNOSIS — Z7982 Long term (current) use of aspirin: Secondary | ICD-10-CM | POA: Diagnosis not present

## 2014-09-30 DIAGNOSIS — N189 Chronic kidney disease, unspecified: Secondary | ICD-10-CM | POA: Insufficient documentation

## 2014-09-30 LAB — CBC
HCT: 31.2 % — ABNORMAL LOW (ref 39.0–52.0)
HEMOGLOBIN: 11 g/dL — AB (ref 13.0–17.0)
MCH: 30.1 pg (ref 26.0–34.0)
MCHC: 35.3 g/dL (ref 30.0–36.0)
MCV: 85.5 fL (ref 78.0–100.0)
Platelets: 243 10*3/uL (ref 150–400)
RBC: 3.65 MIL/uL — ABNORMAL LOW (ref 4.22–5.81)
RDW: 12.8 % (ref 11.5–15.5)
WBC: 7 10*3/uL (ref 4.0–10.5)

## 2014-09-30 LAB — URINE MICROSCOPIC-ADD ON

## 2014-09-30 LAB — BASIC METABOLIC PANEL
ANION GAP: 15 (ref 5–15)
BUN: 27 mg/dL — AB (ref 6–23)
CHLORIDE: 99 meq/L (ref 96–112)
CO2: 20 mEq/L (ref 19–32)
Calcium: 8.5 mg/dL (ref 8.4–10.5)
Creatinine, Ser: 1.7 mg/dL — ABNORMAL HIGH (ref 0.50–1.35)
GFR calc non Af Amer: 44 mL/min — ABNORMAL LOW (ref >90)
GFR, EST AFRICAN AMERICAN: 51 mL/min — AB (ref >90)
Glucose, Bld: 351 mg/dL — ABNORMAL HIGH (ref 70–99)
POTASSIUM: 4.4 meq/L (ref 3.7–5.3)
Sodium: 134 mEq/L — ABNORMAL LOW (ref 137–147)

## 2014-09-30 LAB — CBG MONITORING, ED
GLUCOSE-CAPILLARY: 265 mg/dL — AB (ref 70–99)
Glucose-Capillary: 356 mg/dL — ABNORMAL HIGH (ref 70–99)
Glucose-Capillary: 319 mg/dL — ABNORMAL HIGH (ref 70–99)

## 2014-09-30 LAB — URINALYSIS, ROUTINE W REFLEX MICROSCOPIC
Bilirubin Urine: NEGATIVE
Hgb urine dipstick: NEGATIVE
KETONES UR: NEGATIVE mg/dL
Leukocytes, UA: NEGATIVE
Nitrite: NEGATIVE
Specific Gravity, Urine: 1.02 (ref 1.005–1.030)
UROBILINOGEN UA: 0.2 mg/dL (ref 0.0–1.0)
pH: 5 (ref 5.0–8.0)

## 2014-09-30 LAB — TROPONIN I
Troponin I: <0.3 ng/mL (ref <0.30)
Troponin I: <0.3 ng/mL (ref <0.30)

## 2014-09-30 LAB — I-STAT TROPONIN, ED: TROPONIN I, POC: 0.11 ng/mL — AB (ref 0.00–0.08)

## 2014-09-30 LAB — POCT HEMOGLOBIN-HEMACUE: HEMOGLOBIN: 11.6 g/dL — AB (ref 13.0–17.0)

## 2014-09-30 MED ORDER — DARBEPOETIN ALFA 60 MCG/0.3ML IJ SOSY
60.0000 ug | PREFILLED_SYRINGE | INTRAMUSCULAR | Status: DC
  Administered 2014-09-30: 60 ug via SUBCUTANEOUS

## 2014-09-30 MED ORDER — SODIUM CHLORIDE 0.9 % IV BOLUS (SEPSIS)
1000.0000 mL | Freq: Once | INTRAVENOUS | Status: AC
  Administered 2014-09-30: 1000 mL via INTRAVENOUS

## 2014-09-30 MED ORDER — DARBEPOETIN ALFA 60 MCG/0.3ML IJ SOSY
PREFILLED_SYRINGE | INTRAMUSCULAR | Status: AC
  Filled 2014-09-30: qty 0.3

## 2014-09-30 NOTE — ED Notes (Signed)
  Notified MD and RN of elevated istat- trop.

## 2014-09-30 NOTE — Discharge Instructions (Signed)
Hyperglycemia °Hyperglycemia occurs when the glucose (sugar) in your blood is too high. Hyperglycemia can happen for many reasons, but it most often happens to people who do not know they have diabetes or are not managing their diabetes properly.  °CAUSES  °Whether you have diabetes or not, there are other causes of hyperglycemia. Hyperglycemia can occur when you have diabetes, but it can also occur in other situations that you might not be as aware of, such as: °Diabetes °· If you have diabetes and are having problems controlling your blood glucose, hyperglycemia could occur because of some of the following reasons: °· Not following your meal plan. °· Not taking your diabetes medications or not taking it properly. °· Exercising less or doing less activity than you normally do. °· Being sick. °Pre-diabetes °· This cannot be ignored. Before people develop Type 2 diabetes, they almost always have "pre-diabetes." This is when your blood glucose levels are higher than normal, but not yet high enough to be diagnosed as diabetes. Research has shown that some long-term damage to the body, especially the heart and circulatory system, may already be occurring during pre-diabetes. If you take action to manage your blood glucose when you have pre-diabetes, you may delay or prevent Type 2 diabetes from developing. °Stress °· If you have diabetes, you may be "diet" controlled or on oral medications or insulin to control your diabetes. However, you may find that your blood glucose is higher than usual in the hospital whether you have diabetes or not. This is often referred to as "stress hyperglycemia." Stress can elevate your blood glucose. This happens because of hormones put out by the body during times of stress. If stress has been the cause of your high blood glucose, it can be followed regularly by your caregiver. That way he/she can make sure your hyperglycemia does not continue to get worse or progress to  diabetes. °Steroids °· Steroids are medications that act on the infection fighting system (immune system) to block inflammation or infection. One side effect can be a rise in blood glucose. Most people can produce enough extra insulin to allow for this rise, but for those who cannot, steroids make blood glucose levels go even higher. It is not unusual for steroid treatments to "uncover" diabetes that is developing. It is not always possible to determine if the hyperglycemia will go away after the steroids are stopped. A special blood test called an A1c is sometimes done to determine if your blood glucose was elevated before the steroids were started. °SYMPTOMS °· Thirsty. °· Frequent urination. °· Dry mouth. °· Blurred vision. °· Tired or fatigue. °· Weakness. °· Sleepy. °· Tingling in feet or leg. °DIAGNOSIS  °Diagnosis is made by monitoring blood glucose in one or all of the following ways: °· A1c test. This is a chemical found in your blood. °· Fingerstick blood glucose monitoring. °· Laboratory results. °TREATMENT  °First, knowing the cause of the hyperglycemia is important before the hyperglycemia can be treated. Treatment may include, but is not be limited to: °· Education. °· Change or adjustment in medications. °· Change or adjustment in meal plan. °· Treatment for an illness, infection, etc. °· More frequent blood glucose monitoring. °· Change in exercise plan. °· Decreasing or stopping steroids. °· Lifestyle changes. °HOME CARE INSTRUCTIONS  °· Test your blood glucose as directed. °· Exercise regularly. Your caregiver will give you instructions about exercise. Pre-diabetes or diabetes which comes on with stress is helped by exercising. °· Eat wholesome,   balanced meals. Eat often and at regular, fixed times. Your caregiver or nutritionist will give you a meal plan to guide your sugar intake.  Being at an ideal weight is important. If needed, losing as little as 10 to 15 pounds may help improve blood  glucose levels. SEEK MEDICAL CARE IF:   You have questions about medicine, activity, or diet.  You continue to have symptoms (problems such as increased thirst, urination, or weight gain). SEEK IMMEDIATE MEDICAL CARE IF:   You are vomiting or have diarrhea.  Your breath smells fruity.  You are breathing faster or slower.  You are very sleepy or incoherent.  You have numbness, tingling, or pain in your feet or hands.  You have chest pain.  Your symptoms get worse even though you have been following your caregiver's orders.  If you have any other questions or concerns. Document Released: 04/24/2001 Document Revised: 01/21/2012 Document Reviewed: 02/25/2012 [facility] Patient Information 2015 [place], [place]. This information is not intended to replace advice given to you by your health care provider. Make sure you discuss any questions you have with your health care provider.   Basic Carbohydrate Counting for Diabetes Mellitus Carbohydrate counting is a method for keeping track of the amount of carbohydrates you eat. Eating carbohydrates naturally increases the level of sugar (glucose) in your blood, so it is important for you to know the amount that is okay for you to have in every meal. Carbohydrate counting helps keep the level of glucose in your blood within normal limits. The amount of carbohydrates allowed is different for every person. A dietitian can help you calculate the amount that is right for you. Once you know the amount of carbohydrates you can have, you can count the carbohydrates in the foods you want to eat. Carbohydrates are found in the following foods:  Grains, such as breads and cereals.  Dried beans and soy products.  Starchy vegetables, such as potatoes, peas, and corn.  Fruit and fruit juices.  Milk and yogurt.  Sweets and snack foods, such as cake, cookies, candy, chips, soft drinks, and fruit drinks. CARBOHYDRATE COUNTING There are two ways to count  the carbohydrates in your food. You can use either of the methods or a combination of both. Reading the "Nutrition Facts" on [place] The "Nutrition Facts" is an area that is included on the labels of almost all packaged food and beverages in the [place]. It includes the serving size of that food or beverage and information about the nutrients in each serving of the food, including the grams (g) of carbohydrate per serving.  Decide the number of servings of this food or beverage that you will be able to eat or drink. Multiply that number of servings by the number of grams of carbohydrate that is listed on the label for that serving. The total will be the amount of carbohydrates you will be having when you eat or drink this food or beverage. Learning Standard Serving Sizes of Food When you eat food that is not packaged or does not include "Nutrition Facts" on the label, you need to measure the servings in order to count the amount of carbohydrates.A serving of most carbohydrate-rich foods contains about 15 g of carbohydrates. The following list includes serving sizes of carbohydrate-rich foods that provide 15 g ofcarbohydrate per serving:   1 slice of bread (1 oz) or 1 six-inch tortilla.    of a hamburger bun or English muffin.  4-6 crackers.  cup unsweetened dry cereal.    cup hot cereal.   cup rice or pasta.    cup mashed potatoes or  of a large baked potato.  1 cup fresh fruit or one small piece of fruit.    cup canned or frozen fruit or fruit juice.  1 cup milk.   cup plain fat-free yogurt or yogurt sweetened with artificial sweeteners.   cup cooked dried beans or starchy vegetable, such as peas, corn, or potatoes.  Decide the number of standard-size servings that you will eat. Multiply that number of servings by 15 (the grams of carbohydrates in that serving). For example, if you eat 2 cups of strawberries, you will have eaten 2 servings and 30 g of  carbohydrates (2 servings x 15 g = 30 g). For foods such as soups and casseroles, in which more than one food is mixed in, you will need to count the carbohydrates in each food that is included. EXAMPLE OF CARBOHYDRATE COUNTING Sample Dinner  3 oz chicken breast.   cup of brown rice.   cup of corn.  1 cup milk.   1 cup strawberries with sugar-free whipped topping.  Carbohydrate Calculation Step 1: Identify the foods that contain carbohydrates:   Rice.   Corn.   Milk.   Strawberries. Step 2:Calculate the number of servings eaten of each:   2 servings of rice.   1 serving of corn.   1 serving of milk.   1 serving of strawberries. Step 3: Multiply each of those number of servings by 15 g:   2 servings of rice x 15 g = 30 g.   1 serving of corn x 15 g = 15 g.   1 serving of milk x 15 g = 15 g.   1 serving of strawberries x 15 g = 15 g. Step 4: Add together all of the amounts to find the total grams of carbohydrates eaten: 30 g + 15 g + 15 g + 15 g = 75 g. Document Released: 10/29/2005 Document Revised: 03/15/2014 Document Reviewed: 09/25/2013 [facility] Patient Information 2015 [place], [place]. This information is not intended to replace advice given to you by your health care provider. Make sure you discuss any questions you have with your health care provider.

## 2014-09-30 NOTE — ED Notes (Signed)
Pt reports since yesterday he has been having "head rushes" and feeling like he is going to black out. cbg 365 in triage. sts cbg has been running high lately as well. Pt reports episode of shaking this evening, but remembers the episode. Denies fever, chills, nvd.

## 2014-09-30 NOTE — ED Notes (Signed)
Pt states he is out of his medications, is having trouble getting his MD to contact pharmacy to reffil. Has an appointment with the MD in the morning. Blood sugars have been high and pt reports his blood pressure has been low (113/64 pt states). Pt also reports an episode of seizure like activity today that he remembers. Pt reports his right arm was shaking and his head.

## 2014-09-30 NOTE — ED Provider Notes (Signed)
TIME SEEN: 7:40 PM  CHIEF COMPLAINT: Dizziness, hyperglycemia  HPI: Pt is a 53 y.o. M with history of hypertension, diabetes, chronic kidney disease, hyperlipidemia who presents to the emergency department with lightheadedness. He reports that he has had intermittent episodes of lightheadedness that he describes as a "head rush" that last for several seconds and then resolve. He states during one of these episodes today he had some shaking of his left arm and head but was fully conscious during this episode. There is no incontinence or postictal state afterwards. He denies any chest pain or shortness of breath. No recent vomiting or diarrhea. No numbness, tingling or focal weakness. No headache. States he has not been taking his glipizide and his blood sugars have been elevated. Reports his blood sugar this morning was 4:15. He is not on insulin.  He denies polydipsia, polyuria. No recent fevers.   [facility]  ROS: See HPI Constitutional: no fever  Eyes: no drainage  ENT: no runny nose   Cardiovascular:  no chest pain  Resp: no SOB  GI: no vomiting GU: no dysuria Integumentary: no rash  Allergy: no hives  Musculoskeletal: no leg swelling  Neurological: no slurred speech ROS otherwise negative  PAST MEDICAL HISTORY/PAST SURGICAL HISTORY:  Past Medical History  Diagnosis Date  . Hypertension   . Dysrhythmia     h/o pvc's  . Arthritis   . Diabetes mellitus     dx---been  awhile  . Anemia   . Alcohol abuse   . Chronic kidney disease   . Hyperlipemia   . Cataract     BILATERAL    MEDICATIONS:  Prior to Admission medications   Medication Sig Start Date End Date Taking? Authorizing Provider  aspirin 81 MG chewable tablet Chew 81 mg by mouth daily.    Historical Provider, MD  atorvastatin (LIPITOR) 10 MG tablet Take 1 tablet (10 mg total) by mouth daily. 03/17/14   Angelia Mould, MD  glipiZIDE (GLUCOTROL) 5 MG tablet Take 5 mg by mouth 2 (two) times  daily.    Historical Provider, MD  glucose blood test strip Use as instructed 11/20/13   Reyne Dumas, MD  hydrALAZINE (APRESOLINE) 100 MG tablet Take 100 mg by mouth 2 (two) times daily.    Historical Provider, MD  metoprolol succinate (TOPROL-XL) 50 MG 24 hr tablet TAKE 3 TABLETS BY MOUTH ONCE DAILY 07/13/14   Lorayne Marek, MD  Multiple Vitamins-Minerals (MULTIVITAMINS THER. W/MINERALS) TABS Take 1 tablet by mouth daily. Centrum Silver    Historical Provider, MD  sitaGLIPtin (JANUVIA) 25 MG tablet Take 1 tablet (25 mg total) by mouth daily. 12/11/13   Tresa Garter, MD    ALLERGIES:  Allergies  Allergen Reactions  . Sulfa Antibiotics Rash    SOCIAL HISTORY:  History  Substance Use Topics  . Smoking status: Light Tobacco Smoker -- 1.50 packs/day for 30 years    Types: Cigars  . Smokeless tobacco: Never Used  . Alcohol Use: 21.0 oz/week    35 Cans of beer per week     Comment: 6 of 12 oz beers a day    FAMILY HISTORY: Family History  Problem Relation Age of Onset  . Diabetes type II Mother   . Hypertension Mother   . Liver cancer Father   . Diabetes type II Sister   . Breast cancer Sister   . Diabetes type II Brother   . Kidney failure Brother   . Diabetes type II  Sister     EXAM: BP 139/61 mmHg  Pulse 75  Temp(Src) 97.8 F (36.6 C) (Oral)  Resp 18  SpO2 97% CONSTITUTIONAL: Alert and oriented and responds appropriately to questions. Well-appearing; well-nourished HEAD: Normocephalic EYES: Conjunctivae clear, PERRL ENT: normal nose; no rhinorrhea; moist mucous membranes; pharynx without lesions noted NECK: Supple, no meningismus, no LAD  CARD: RRR; S1 and S2 appreciated; no murmurs, no clicks, no rubs, no gallops RESP: Normal chest excursion without splinting or tachypnea; breath sounds clear and equal bilaterally; no wheezes, no rhonchi, no rales,  ABD/GI: Normal bowel sounds; non-distended; soft, non-tender, no rebound, no guarding BACK:  The back appears  normal and is non-tender to palpation, there is no CVA tenderness EXT: Normal ROM in all joints; non-tender to palpation; no edema; normal capillary refill; no cyanosis    SKIN: Normal color for age and race; warm NEURO: Moves all extremities equally; sensation to light touch intact diffusely, no pronator drift, cranial nerves II-12 intact PSYCH: The patient's mood and manner are appropriate. Grooming and personal hygiene are appropriate.  MEDICAL DECISION MAKING: Pt here with intermittent episodes of lightheadedness. His labs are unremarkable other than mild elevation of his creatinine at 1.7 which she has had in the past. He is hyperglycemic but has an anion gap of 15, bicarbonate of 20. He is not in DKA. Will give IV fluids, check orthostatic vital signs. Patient reports that he is concerned that this could be coming from his heart is not having chest pain or shortness of breath. His EKG shows a bifascicular block which is unchanged. We'll add on cardiac enzymes as he does have risk factors for ACS.  He has follow-up with his primary care physician tomorrow.  I doubt that this was a seizure as he was awake, orientated before, after and during this episode. No prior history of epilepsy.  ED PROGRESS: Patient's repeat troponin is negative. Suspect his i-STAT troponin may have been slightly elevated due to his chronic kidney disease but will repeat a second troponin. Urine shows glucose, protein which is chronic. He does have bacteria in his urine but no other sign of infection. We'll send urine culture at this time I do not feel he needs to be treated. Will repeat CBG.  Orthostatic vital signs negative.   11:20 PM  Pt's repeat troponin is negative. He states he is still feeling well. He has outpatient follow-up tomorrow. Have advised him to check his blood sugar, avoid carbohydrates and take his medications. Have discussed return precautions. He verbalizes understanding and is comfortable with  plan.     EKG Interpretation  Date/Time:  Thursday September 30 2014 16:28:05 EST Ventricular Rate:  67 PR Interval:  166 QRS Duration: 152 QT Interval:  462 QTC Calculation: 488 R Axis:   -79 Text Interpretation:  Normal sinus rhythm Right bundle branch block Left anterior fascicular block Bifascicular block Abnormal ECG No significant change since last tracing Confirmed by Tyan Lasure,  DO, Kadra Kohan [id number]) on 09/30/2014 7:35:43 PM         [place], DO 09/30/14 2321

## 2014-10-01 ENCOUNTER — Encounter: Payer: Self-pay | Admitting: Internal Medicine

## 2014-10-01 ENCOUNTER — Ambulatory Visit: Payer: Medicaid Other | Attending: Internal Medicine | Admitting: Internal Medicine

## 2014-10-01 VITALS — BP 170/80 | HR 69 | Temp 97.8°F | Resp 16

## 2014-10-01 DIAGNOSIS — K029 Dental caries, unspecified: Secondary | ICD-10-CM

## 2014-10-01 DIAGNOSIS — Z7982 Long term (current) use of aspirin: Secondary | ICD-10-CM | POA: Insufficient documentation

## 2014-10-01 DIAGNOSIS — I10 Essential (primary) hypertension: Secondary | ICD-10-CM | POA: Diagnosis not present

## 2014-10-01 DIAGNOSIS — E119 Type 2 diabetes mellitus without complications: Secondary | ICD-10-CM | POA: Insufficient documentation

## 2014-10-01 DIAGNOSIS — Z89432 Acquired absence of left foot: Secondary | ICD-10-CM | POA: Diagnosis not present

## 2014-10-01 DIAGNOSIS — H9313 Tinnitus, bilateral: Secondary | ICD-10-CM | POA: Diagnosis not present

## 2014-10-01 DIAGNOSIS — F17210 Nicotine dependence, cigarettes, uncomplicated: Secondary | ICD-10-CM | POA: Insufficient documentation

## 2014-10-01 DIAGNOSIS — E785 Hyperlipidemia, unspecified: Secondary | ICD-10-CM | POA: Diagnosis not present

## 2014-10-01 DIAGNOSIS — E139 Other specified diabetes mellitus without complications: Secondary | ICD-10-CM

## 2014-10-01 LAB — POCT URINALYSIS DIPSTICK
BILIRUBIN UA: NEGATIVE
Blood, UA: NEGATIVE
GLUCOSE UA: 500
KETONES UA: NEGATIVE
LEUKOCYTES UA: NEGATIVE
Nitrite, UA: NEGATIVE
Protein, UA: >=300
SPEC GRAV UA: 1.015
Urobilinogen, UA: 0.2
pH, UA: 6.5

## 2014-10-01 LAB — GLUCOSE, POCT (MANUAL RESULT ENTRY): POC Glucose: 318 mg/dl — AB (ref 70–99)

## 2014-10-01 LAB — POCT GLYCOSYLATED HEMOGLOBIN (HGB A1C): Hemoglobin A1C: 7.6

## 2014-10-01 MED ORDER — ACCU-CHEK ADVANTAGE DIABETES KIT: PACK | Status: DC

## 2014-10-01 MED ORDER — HYDRALAZINE HCL 100 MG PO TABS: 100.0000 mg | ORAL_TABLET | Freq: Three times a day (TID) | ORAL | Status: DC

## 2014-10-01 MED ORDER — ACCU-CHEK FASTCLIX LANCETS MISC: Status: DC

## 2014-10-01 MED ORDER — GLUCOSE BLOOD VI STRP: ORAL_STRIP | Status: DC

## 2014-10-01 MED ORDER — GLIPIZIDE 5 MG PO TABS
5.0000 mg | ORAL_TABLET | Freq: Two times a day (BID) | ORAL | Status: DC
Start: 2014-10-01 — End: 2015-02-07

## 2014-10-01 NOTE — Progress Notes (Signed)
  MRN: [id number] Name: Alexander [name]  Sex: male Age: 53 y.o. DOB: 07/04/1961  Allergies: Sulfa antibiotics  Chief Complaint  Patient presents with  . Follow-up    HPI: Patient is 53 y.o. male who has history of hypertension diabetes hyperlipidemia PAD, yesterday patient went to the restroom with symptom of dizziness, EMR reviewed patient had elevated blood sugar level, was treated with IV fluid patient also has worsening renal function, as per patient he then out of his Glucotrol medication for the last one month but has been taking Januvia his hemoglobin A1c 10 has gone up compared to last visit currently 7.5%, his blood pressure is also uncontrolled, he was given clonidine and his repeat manual blood pressure is 170/80 currently denies any headache dizziness chest and shortness of breath, he does report ringing in the ears as per patient in the past he had tympanic tubes placed, he has not followed up with ENT. Patient also has of dental cavities and is requesting referral to see a dentist.  Past Medical History  Diagnosis Date  . Hypertension   . Dysrhythmia     h/o pvc's  . Arthritis   . Diabetes mellitus     dx---been  awhile  . Anemia   . Alcohol abuse   . Chronic kidney disease   . Hyperlipemia   . Cataract     BILATERAL    Past Surgical History  Procedure Laterality Date  . Lumbar laminectomy  06/13/2012    Procedure: MICRODISCECTOMY LUMBAR LAMINECTOMY;  Surgeon: James E Nitka, MD;  Location: [facility] OR;  Service: Orthopedics;  Laterality: N/A;  Central laminectomy L2-3, L3-4, L4-5  . Amputation Left 08/19/2013    Procedure: AMPUTATION RAY;  Surgeon: [name] Graves, MD;  Location: [facility] OR;  Service: Orthopedics;  Laterality: Left;  ray amputation left 5th  . Femoral-tibial bypass graft Left 10/27/2013    Procedure: BYPASS GRAFT LEFT FEMORAL- POSTERIOR TIBIAL ARTERY;  Surgeon: [name] Dickson, MD;  Location: [facility] OR;  Service: Vascular;  Laterality: Left;  . I&d extremity  Left 10/27/2013    Procedure: IRRIGATION AND DEBRIDEMENT EXTREMITY- LEFT FOOT;  Surgeon: [name] Dickson, MD;  Location: [facility] OR;  Service: Vascular;  Laterality: Left;  . Amputation Left 10/27/2013    Procedure: AMPUTATION DIGIT-LEFT 4TH TOE, 4th and 5th metatarsal.;  Surgeon: [name] Dickson, MD;  Location: [facility] OR;  Service: Vascular;  Laterality: Left;  . Amputation Left 11/04/2013    Procedure: LEFT FOOT TRANS-METATARSAL AMPUTATION WITH WOUND CLOSURE ;  Surgeon: [name] Graves, MD;  Location: [facility] OR;  Service: Orthopedics;  Laterality: Left;      Medication List       This list is accurate as of: 10/01/14  3:25 PM.  Always use your most recent med list.               ACCU-CHEK ADVANTAGE DIABETES kit  Use as instructed     ACCU-CHEK FASTCLIX LANCETS Misc  Check blood sugar TID & QHS     acetaminophen 500 MG tablet  Commonly known as:  TYLENOL  Take 1,000 mg by mouth every 6 (six) hours as needed for moderate pain.     aspirin 81 MG chewable tablet  Chew 81 mg by mouth daily.     atorvastatin 10 MG tablet  Commonly known as:  LIPITOR  Take 1 tablet (10 mg total) by mouth daily.     glipiZIDE 5 MG tablet  Commonly known as:  GLUCOTROL    Take 1 tablet (5 mg total) by mouth 2 (two) times daily.     glucose blood test strip  Use as instructed     glucose blood test strip  Commonly known as:  ACCU-CHEK AVIVA PLUS  Use as instructed     hydrALAZINE 100 MG tablet  Commonly known as:  APRESOLINE  Take 1 tablet (100 mg total) by mouth 3 (three) times daily.     metoprolol succinate 50 MG 24 hr tablet  Commonly known as:  TOPROL-XL  TAKE 3 TABLETS BY MOUTH ONCE DAILY     multivitamins ther. w/minerals Tabs tablet  Take 1 tablet by mouth daily. Centrum Silver     sitaGLIPtin 25 MG tablet  Commonly known as:  JANUVIA  Take 1 tablet (25 mg total) by mouth daily.        Meds ordered this encounter  Medications  . Blood Glucose Monitoring Suppl (ACCU-CHEK  ADVANTAGE DIABETES) kit    Sig: Use as instructed    Dispense:  1 each    Refill:  0  . glucose blood (ACCU-CHEK AVIVA PLUS) test strip    Sig: Use as instructed    Dispense:  100 each    Refill:  12  . ACCU-CHEK FASTCLIX LANCETS MISC    Sig: Check blood sugar TID & QHS    Dispense:  102 each    Refill:  2  . glipiZIDE (GLUCOTROL) 5 MG tablet    Sig: Take 1 tablet (5 mg total) by mouth 2 (two) times daily.    Dispense:  60 tablet    Refill:  3  . hydrALAZINE (APRESOLINE) 100 MG tablet    Sig: Take 1 tablet (100 mg total) by mouth 3 (three) times daily.    Dispense:  90 tablet    Refill:  3    Immunization History  Administered Date(s) Administered  . Influenza,inj,Quad PF,36+ Mos 10/22/2013, 09/06/2014  . Pneumococcal Polysaccharide-23 10/22/2013    Family History  Problem Relation Age of Onset  . Diabetes type II Mother   . Hypertension Mother   . Liver cancer Father   . Diabetes type II Sister   . Breast cancer Sister   . Diabetes type II Brother   . Kidney failure Brother   . Diabetes type II Sister     History  Substance Use Topics  . Smoking status: Light Tobacco Smoker -- 1.50 packs/day for 30 years    Types: Cigars  . Smokeless tobacco: Never Used  . Alcohol Use: 21.0 oz/week    35 Cans of beer per week     Comment: 6 of 12 oz beers a day    Review of Systems   As noted in HPI  Filed Vitals:   10/01/14 1521  BP: 170/80  Pulse:   Temp:   Resp:     Physical Exam  Physical Exam  HENT:  TM tubes in both ears .  Dental cavities   Eyes: EOM are normal.  Neck: Neck supple.  Cardiovascular: Normal rate and regular rhythm.   Pulmonary/Chest: Breath sounds normal. No respiratory distress. He has no wheezes. He has no rales.    CBC    Component Value Date/Time   WBC 7.0 09/30/2014 1619   RBC 3.65* 09/30/2014 1619   RBC 3.32* 10/25/2013 1636   HGB 11.0* 09/30/2014 1619   HCT 31.2* 09/30/2014 1619   PLT 243 09/30/2014 1619   MCV 85.5  09/30/2014 1619   LYMPHSABS 2.4 11/20/2013 1422  MONOABS 0.7 11/20/2013 1422   EOSABS 1.1* 11/20/2013 1422   BASOSABS 0.1 11/20/2013 1422    CMP     Component Value Date/Time   NA 134* 09/30/2014 1619   K 4.4 09/30/2014 1619   CL 99 09/30/2014 1619   CO2 20 09/30/2014 1619   GLUCOSE 351* 09/30/2014 1619   BUN 27* 09/30/2014 1619   CREATININE 1.70* 09/30/2014 1619   CREATININE 2.42* 11/20/2013 1422   CALCIUM 8.5 09/30/2014 1619   PROT 6.5 05/01/2014 0315   ALBUMIN 3.2* 05/01/2014 0315   AST 35 05/01/2014 0315   ALT 55* 05/01/2014 0315   ALKPHOS 79 05/01/2014 0315   BILITOT 0.7 05/01/2014 0315   GFRNONAA 44* 09/30/2014 1619   GFRNONAA 29* 11/20/2013 1422   GFRAA 51* 09/30/2014 1619   GFRAA 34* 11/20/2013 1422    Lab Results  Component Value Date/Time   CHOL 147 11/20/2013 02:22 PM    No components found for: HGA1C  Lab Results  Component Value Date/Time   AST 35 05/01/2014 03:15 AM    Assessment and Plan  Other specified diabetes mellitus without complications - Plan: Results for orders placed or performed in visit on 10/01/14  Glucose (CBG)  Result Value Ref Range   POC Glucose 318 (A) 70 - 99 mg/dl  HgB A1c  Result Value Ref Range   Hemoglobin A1C 7.6   Urinalysis Dipstick  Result Value Ref Range   Color, UA yellow    Clarity, UA clear    Glucose, UA 500    Bilirubin, UA neg    Ketones, UA neg    Spec Grav, UA 1.015    Blood, UA neg    pH, UA 6.5    Protein, UA >=300    Urobilinogen, UA 0.2    Nitrite, UA neg    Leukocytes, UA Negative    Hemoglobin A1c has trended up, we'll resume him back on Glucotrol continue Januvia, advise patient for diabetes meal planning will repeat his blood chemistry in 3 months. Urinalysis Dipstick, glipiZIDE (GLUCOTROL) 5 MG tablet  Essential hypertension, benign - Plan:blood pressure is uncontrolled, currently he is taking metoprolol, I have increased the dose of hydrALAZINE (APRESOLINE) 100 MG tablettake 3 times a  day, advised patient for DASH diet, she'll come back in 2 weeks per nurse visit check  Tinnitus, bilateral - Plan: Ambulatory referral to ENT  Dental cavities - Plan: Ambulatory referral to Dentistry   Health Maintenance -Colonoscopy: uptodate   uptodate with flu shot and pneumovax   Return in about 3 months (around 01/01/2015) for diabetes, hypertension, BP check in 2 weeks/Nurse Visit.  ADVANI, DEEPAK, MD   

## 2014-10-01 NOTE — Progress Notes (Signed)
Patient here for follow up  Was seen in the ED yesterday for seizure like symptoms States his sugars have been running high

## 2014-10-02 LAB — URINE CULTURE

## 2014-10-05 ENCOUNTER — Encounter: Payer: Self-pay | Admitting: Internal Medicine

## 2014-10-07 ENCOUNTER — Encounter (HOSPITAL_COMMUNITY): Payer: Self-pay

## 2014-10-07 ENCOUNTER — Encounter (HOSPITAL_COMMUNITY)
Admission: EM | Disposition: A | Discharge status: Home / Self Care | Payer: Self-pay | Source: Home / Self Care | Attending: Cardiology

## 2014-10-07 ENCOUNTER — Ambulatory Visit (HOSPITAL_COMMUNITY): Admit: 2014-10-07 | Payer: Self-pay | Admitting: Cardiology

## 2014-10-07 ENCOUNTER — Emergency Department (HOSPITAL_COMMUNITY): Payer: Medicaid Other

## 2014-10-07 ENCOUNTER — Inpatient Hospital Stay (HOSPITAL_COMMUNITY)
Admission: EM | Admit: 2014-10-07 | Discharge: 2014-10-09 | DRG: 243 | Disposition: A | Discharge status: Home / Self Care | Payer: Medicaid Other | Attending: Cardiology | Admitting: Cardiology

## 2014-10-07 DIAGNOSIS — N183 Chronic kidney disease, stage 3 unspecified: Secondary | ICD-10-CM

## 2014-10-07 DIAGNOSIS — I129 Hypertensive chronic kidney disease with stage 1 through stage 4 chronic kidney disease, or unspecified chronic kidney disease: Secondary | ICD-10-CM | POA: Diagnosis present

## 2014-10-07 DIAGNOSIS — E1165 Type 2 diabetes mellitus with hyperglycemia: Secondary | ICD-10-CM | POA: Diagnosis present

## 2014-10-07 DIAGNOSIS — I517 Cardiomegaly: Secondary | ICD-10-CM | POA: Diagnosis present

## 2014-10-07 DIAGNOSIS — E785 Hyperlipidemia, unspecified: Secondary | ICD-10-CM | POA: Diagnosis present

## 2014-10-07 DIAGNOSIS — I739 Peripheral vascular disease, unspecified: Secondary | ICD-10-CM | POA: Diagnosis present

## 2014-10-07 DIAGNOSIS — Z89432 Acquired absence of left foot: Secondary | ICD-10-CM | POA: Diagnosis not present

## 2014-10-07 DIAGNOSIS — R55 Syncope and collapse: Secondary | ICD-10-CM | POA: Diagnosis present

## 2014-10-07 DIAGNOSIS — F1010 Alcohol abuse, uncomplicated: Secondary | ICD-10-CM | POA: Diagnosis present

## 2014-10-07 DIAGNOSIS — F17210 Nicotine dependence, cigarettes, uncomplicated: Secondary | ICD-10-CM | POA: Diagnosis present

## 2014-10-07 DIAGNOSIS — Z959 Presence of cardiac and vascular implant and graft, unspecified: Secondary | ICD-10-CM

## 2014-10-07 DIAGNOSIS — I480 Paroxysmal atrial fibrillation: Secondary | ICD-10-CM | POA: Diagnosis present

## 2014-10-07 DIAGNOSIS — Z880 Allergy status to penicillin: Secondary | ICD-10-CM

## 2014-10-07 DIAGNOSIS — I452 Bifascicular block: Secondary | ICD-10-CM | POA: Diagnosis present

## 2014-10-07 DIAGNOSIS — I2510 Atherosclerotic heart disease of native coronary artery without angina pectoris: Secondary | ICD-10-CM | POA: Diagnosis present

## 2014-10-07 DIAGNOSIS — N189 Chronic kidney disease, unspecified: Secondary | ICD-10-CM | POA: Diagnosis present

## 2014-10-07 DIAGNOSIS — D631 Anemia in chronic kidney disease: Secondary | ICD-10-CM | POA: Diagnosis present

## 2014-10-07 DIAGNOSIS — I471 Supraventricular tachycardia: Secondary | ICD-10-CM | POA: Diagnosis present

## 2014-10-07 DIAGNOSIS — I495 Sick sinus syndrome: Secondary | ICD-10-CM | POA: Diagnosis present

## 2014-10-07 DIAGNOSIS — I469 Cardiac arrest, cause unspecified: Secondary | ICD-10-CM

## 2014-10-07 DIAGNOSIS — E1122 Type 2 diabetes mellitus with diabetic chronic kidney disease: Secondary | ICD-10-CM

## 2014-10-07 HISTORY — PX: CARDIAC CATHETERIZATION: SHX172

## 2014-10-07 HISTORY — DX: Syncope and collapse: R55

## 2014-10-07 HISTORY — PX: TEMPORARY PACEMAKER INSERTION: SHX5471

## 2014-10-07 HISTORY — DX: Unspecified atrial fibrillation: I48.91

## 2014-10-07 LAB — CBC WITH DIFFERENTIAL/PLATELET
Basophils Absolute: 0.1 10*3/uL (ref 0.0–0.1)
Basophils Relative: 1 % (ref 0–1)
Eosinophils Absolute: 0.1 10*3/uL (ref 0.0–0.7)
Eosinophils Relative: 1 % (ref 0–5)
HCT: 34.6 % — ABNORMAL LOW (ref 39.0–52.0)
Hemoglobin: 12.4 g/dL — ABNORMAL LOW (ref 13.0–17.0)
LYMPHS PCT: 19 % (ref 12–46)
Lymphs Abs: 1.9 10*3/uL (ref 0.7–4.0)
MCH: 30.6 pg (ref 26.0–34.0)
MCHC: 35.8 g/dL (ref 30.0–36.0)
MCV: 85.4 fL (ref 78.0–100.0)
Monocytes Absolute: 0.7 10*3/uL (ref 0.1–1.0)
Monocytes Relative: 7 % (ref 3–12)
NEUTROS ABS: 7.3 10*3/uL (ref 1.7–7.7)
Neutrophils Relative %: 72 % (ref 43–77)
PLATELETS: 267 10*3/uL (ref 150–400)
RBC: 4.05 MIL/uL — ABNORMAL LOW (ref 4.22–5.81)
RDW: 13.1 % (ref 11.5–15.5)
WBC: 10 10*3/uL (ref 4.0–10.5)

## 2014-10-07 LAB — I-STAT CHEM 8, ED
BUN: 22 mg/dL (ref 6–23)
CHLORIDE: 106 meq/L (ref 96–112)
CREATININE: 1.5 mg/dL — AB (ref 0.50–1.35)
Calcium, Ion: 1.11 mmol/L — ABNORMAL LOW (ref 1.12–1.23)
Glucose, Bld: 204 mg/dL — ABNORMAL HIGH (ref 70–99)
HEMATOCRIT: 37 % — AB (ref 39.0–52.0)
Hemoglobin: 12.6 g/dL — ABNORMAL LOW (ref 13.0–17.0)
POTASSIUM: 4.4 meq/L (ref 3.7–5.3)
Sodium: 138 mEq/L (ref 137–147)
TCO2: 19 mmol/L (ref 0–100)

## 2014-10-07 LAB — MAGNESIUM: Magnesium: 2 mg/dL (ref 1.5–2.5)

## 2014-10-07 LAB — COMPREHENSIVE METABOLIC PANEL
ALK PHOS: 100 U/L (ref 39–117)
ALT: 33 U/L (ref 0–53)
AST: 22 U/L (ref 0–37)
Albumin: 3 g/dL — ABNORMAL LOW (ref 3.5–5.2)
Anion gap: 16 — ABNORMAL HIGH (ref 5–15)
BILIRUBIN TOTAL: 0.5 mg/dL (ref 0.3–1.2)
BUN: 22 mg/dL (ref 6–23)
CHLORIDE: 103 meq/L (ref 96–112)
CO2: 17 meq/L — AB (ref 19–32)
Calcium: 8.6 mg/dL (ref 8.4–10.5)
Creatinine, Ser: 1.32 mg/dL (ref 0.50–1.35)
GFR calc non Af Amer: 59 mL/min — ABNORMAL LOW (ref >90)
GFR, EST AFRICAN AMERICAN: 69 mL/min — AB (ref >90)
GLUCOSE: 194 mg/dL — AB (ref 70–99)
Potassium: 4.7 mEq/L (ref 3.7–5.3)
SODIUM: 136 meq/L — AB (ref 137–147)
Total Protein: 6.4 g/dL (ref 6.0–8.3)

## 2014-10-07 LAB — MRSA PCR SCREENING: MRSA BY PCR: NEGATIVE

## 2014-10-07 LAB — I-STAT TROPONIN, ED: Troponin i, poc: 0.02 ng/mL (ref 0.00–0.08)

## 2014-10-07 LAB — GLUCOSE, CAPILLARY: Glucose-Capillary: 236 mg/dL — ABNORMAL HIGH (ref 70–99)

## 2014-10-07 SURGERY — TEMPORARY PACEMAKER INSERTION
Anesthesia: LOCAL | Laterality: Bilateral

## 2014-10-07 MED ORDER — SODIUM CHLORIDE 0.9 % IJ SOLN: 3.0000 mL | Freq: Two times a day (BID) | INTRAMUSCULAR | Status: DC

## 2014-10-07 MED ORDER — DIAZEPAM 2 MG PO TABS: 2.0000 mg | ORAL_TABLET | ORAL | Status: DC | PRN

## 2014-10-07 MED ORDER — HEPARIN (PORCINE) IN NACL 2-0.9 UNIT/ML-% IJ SOLN
INTRAMUSCULAR | Status: AC
  Filled 2014-10-07: qty 1500

## 2014-10-07 MED ORDER — FENTANYL CITRATE 0.05 MG/ML IJ SOLN
INTRAMUSCULAR | Status: AC
  Filled 2014-10-07: qty 2

## 2014-10-07 MED ORDER — SODIUM CHLORIDE 0.9 % IJ SOLN: 3.0000 mL | INTRAMUSCULAR | Status: DC | PRN

## 2014-10-07 MED ORDER — MIDAZOLAM HCL 2 MG/2ML IJ SOLN
INTRAMUSCULAR | Status: AC
  Filled 2014-10-07: qty 2

## 2014-10-07 MED ORDER — ACETAMINOPHEN 500 MG PO TABS
1000.0000 mg | ORAL_TABLET | Freq: Four times a day (QID) | ORAL | Status: DC | PRN
  Administered 2014-10-08: 22:00:00 1000 mg via ORAL
  Filled 2014-10-07: qty 2

## 2014-10-07 MED ORDER — ALPRAZOLAM 0.25 MG PO TABS: 0.2500 mg | ORAL_TABLET | Freq: Two times a day (BID) | ORAL | Status: DC | PRN

## 2014-10-07 MED ORDER — LIDOCAINE HCL (PF) 1 % IJ SOLN
INTRAMUSCULAR | Status: AC
  Filled 2014-10-07: qty 30

## 2014-10-07 MED ORDER — METOPROLOL SUCCINATE ER 50 MG PO TB24
150.0000 mg | ORAL_TABLET | Freq: Every day | ORAL | Status: DC
  Administered 2014-10-08 – 2014-10-09 (×3): 150 mg via ORAL
  Filled 2014-10-07 (×3): qty 1

## 2014-10-07 MED ORDER — HEPARIN (PORCINE) IN NACL 100-0.45 UNIT/ML-% IJ SOLN
1400.0000 [IU]/h | INTRAMUSCULAR | Status: DC
  Administered 2014-10-08: 1400 [IU]/h via INTRAVENOUS
  Filled 2014-10-07 (×3): qty 250

## 2014-10-07 MED ORDER — ONDANSETRON HCL 4 MG/2ML IJ SOLN: 4.0000 mg | Freq: Four times a day (QID) | INTRAMUSCULAR | Status: DC | PRN

## 2014-10-07 MED ORDER — AMIODARONE HCL IN DEXTROSE 360-4.14 MG/200ML-% IV SOLN
60.0000 mg/h | INTRAVENOUS | Status: DC
  Administered 2014-10-07: 60 mg/h via INTRAVENOUS
  Filled 2014-10-07 (×2): qty 200

## 2014-10-07 MED ORDER — AMIODARONE HCL 150 MG/3ML IV SOLN
INTRAVENOUS | Status: AC
  Filled 2014-10-07: qty 3

## 2014-10-07 MED ORDER — ATORVASTATIN CALCIUM 10 MG PO TABS
10.0000 mg | ORAL_TABLET | Freq: Every day | ORAL | Status: DC
  Administered 2014-10-07 – 2014-10-09 (×2): 10 mg via ORAL
  Filled 2014-10-07 (×4): qty 1

## 2014-10-07 MED ORDER — AMIODARONE HCL IN DEXTROSE 360-4.14 MG/200ML-% IV SOLN
30.0000 mg/h | INTRAVENOUS | Status: DC
  Administered 2014-10-08 (×2): 30 mg/h via INTRAVENOUS
  Filled 2014-10-07 (×4): qty 200

## 2014-10-07 MED ORDER — SODIUM CHLORIDE 0.9 % IV SOLN
1.0000 mL/kg/h | INTRAVENOUS | Status: DC
  Administered 2014-10-07: 1 mL/kg/h via INTRAVENOUS

## 2014-10-07 MED ORDER — NITROGLYCERIN 1 MG/10 ML FOR IR/CATH LAB
INTRA_ARTERIAL | Status: AC
  Filled 2014-10-07: qty 10

## 2014-10-07 MED ORDER — SODIUM CHLORIDE 0.9 % IV SOLN: 250.0000 mL | INTRAVENOUS | Status: DC | PRN

## 2014-10-07 MED ORDER — SODIUM CHLORIDE 0.9 % IV BOLUS (SEPSIS)
1000.0000 mL | Freq: Once | INTRAVENOUS | Status: AC
  Administered 2014-10-07: 1000 mL via INTRAVENOUS

## 2014-10-07 MED ORDER — LINAGLIPTIN 5 MG PO TABS
5.0000 mg | ORAL_TABLET | Freq: Every day | ORAL | Status: DC
  Administered 2014-10-09: 5 mg via ORAL
  Filled 2014-10-07 (×2): qty 1

## 2014-10-07 MED ORDER — ASPIRIN 81 MG PO CHEW
81.0000 mg | CHEWABLE_TABLET | Freq: Every day | ORAL | Status: DC
  Administered 2014-10-08 – 2014-10-09 (×2): 81 mg via ORAL
  Filled 2014-10-07 (×2): qty 1

## 2014-10-07 NOTE — Progress Notes (Signed)
Chaplain responded to code stemi. Family could not be located in ED. Chaplain asked ED staff to page her should pt family need support. [id number]   10/07/14 1730  Clinical Encounter Type  Visited With Health care provider  Visit Type Code;ED  Rolly Salter, Chaplain 10/07/2014 6:15 PM

## 2014-10-07 NOTE — ED Notes (Signed)
Per [facility], pt from home for syncopal episode. Has had occasional episodes since colonoscopy on 17th. Pt states he feels like he gets lightheaded and shakes and then wakes up. Sometimes he remembers everything vs few seconds of not remembering anything. Per EMS, pt looks conscious but there was a few seconds he couldn't tell her what was happening. Pt had two episodes of sinus pause in route. One lasting 10-15 seconds and second lasting 5 seconds. Hx of afib and returns to afib on the monitor. 20g to [facility].

## 2014-10-07 NOTE — Progress Notes (Signed)
ANTICOAGULATION CONSULT NOTE - Initial Consult  Pharmacy Consult:  Heparin Indication:  ACS s/p cath  Allergies  Allergen Reactions  . Sulfa Antibiotics Rash    Patient Measurements: Height: 6' (182.9 cm) Weight: 253 lb 1.4 oz (114.8 kg) IBW/kg (Calculated) : 77.6 Heparin Dosing Weight: 102 kg  Vital Signs: BP: 150/68 mmHg (11/26 1730) Pulse Rate: 68 (11/26 1802)  Labs:  Recent Labs  10/07/14 1636 10/07/14 1653  HGB 12.4* 12.6*  HCT 34.6* 37.0*  PLT 267  --   CREATININE 1.32 1.50*    Estimated Creatinine Clearance: 72.8 mL/min (by C-G formula based on Cr of 1.5).   Medical History: Past Medical History  Diagnosis Date  . Hypertension   . Dysrhythmia     h/o pvc's  . Arthritis   . Diabetes mellitus     dx---been  awhile  . Anemia   . Alcohol abuse   . Chronic kidney disease   . Hyperlipemia   . Cataract     BILATERAL       Assessment: 68 YOM presented with recurrent episodes of syncope and was taken to the cath lab for emergent temporary pacemaker.  Now s/p cath with plan for permanent pacemaker insertion and may need intervention due to high-grade LAD stenosis.  Pharmacy consulted to initiate IV heparin 8 hours post sheath removal.  RN reported arterial sheath was removed around 1915 and no bleeding nor hematoma is present.  Verified with Dr. [name] and heparin to start 8 hours after arterial sheath is removed.   Goal of Therapy:  Heparin level 0.3-0.7 units/ml Monitor platelets by anticoagulation protocol: Yes    Plan:  - At 0300 on 10/08/14, start heparin gtt at 1400 units/hr - Check 6 hr HL post heparin initiated - Daily HL / CBC    Nicanor Mendolia D. Mina Marble, PharmD, BCPS Pager:  [phone] 10/07/2014, 8:05 PM

## 2014-10-07 NOTE — ED Notes (Signed)
Pt just had brief episode of sinus pause on defib, seen by Dr. Ralene Bathe. Pt was nonverbal for few seconds. Pt now in NSR.

## 2014-10-07 NOTE — ED Notes (Signed)
Pt placed on defibrillator.

## 2014-10-07 NOTE — H&P (Signed)
Alexander [name] is an 53 y.o. male.   Chief Complaint: Syncope HPI: Patient is a 53 year old Caucasian male with history of hypertension, hyperlipidemia, diabetes mellitus which is uncontrolled, peripheral arterial disease with history of left femoropopliteal a past surgery and Sims amputation of the left foot, continued tobacco use disorder, admitted via emergency room after he presented with recurrent episodes of syncope. While on route to the emergency room, patient was found to be in paroxysmal episodes of atrial fibrillation. He also had episodes of 6-8 second ventricle standstill leading to syncope. Patient also had a witnessed episode of syncope with about 6-8 seconds of ventricular standstill in the emergency room, however telemetry strips were not obtained.   Patient had noticed palpitations ongoing for the past 1 week. Mild dyspnea which is chronic, but no PND or orthopnea. No chest pain was described.  On history,  Past Medical History  Diagnosis Date  . Hypertension   . Dysrhythmia     h/o pvc's  . Arthritis   . Diabetes mellitus     dx---been  awhile  . Anemia   . Alcohol abuse   . Chronic kidney disease   . Hyperlipemia   . Cataract     BILATERAL    Past Surgical History  Procedure Laterality Date  . Lumbar laminectomy  06/13/2012    Procedure: MICRODISCECTOMY LUMBAR LAMINECTOMY;  Surgeon: Jessy Oto, MD;  Location: [place];  Service: Orthopedics;  Laterality: N/A;  Central laminectomy L2-3, L3-4, L4-5  . Amputation Left 08/19/2013    Procedure: AMPUTATION RAY;  Surgeon: Alta Corning, MD;  Location: [place];  Service: Orthopedics;  Laterality: Left;  ray amputation left 5th  . Femoral-tibial bypass graft Left 10/27/2013    Procedure: BYPASS GRAFT LEFT FEMORAL- POSTERIOR TIBIAL ARTERY;  Surgeon: Angelia Mould, MD;  Location: [place];  Service: Vascular;  Laterality: Left;  . I&d extremity Left 10/27/2013    Procedure: IRRIGATION AND DEBRIDEMENT EXTREMITY- LEFT FOOT;   Surgeon: Angelia Mould, MD;  Location: [place];  Service: Vascular;  Laterality: Left;  . Amputation Left 10/27/2013    Procedure: AMPUTATION DIGIT-LEFT 4TH TOE, 4th and 5th metatarsal.;  Surgeon: Angelia Mould, MD;  Location: [place];  Service: Vascular;  Laterality: Left;  . Amputation Left 11/04/2013    Procedure: LEFT FOOT TRANS-METATARSAL AMPUTATION WITH WOUND CLOSURE ;  Surgeon: Alta Corning, MD;  Location: [place];  Service: Orthopedics;  Laterality: Left;    Family History  Problem Relation Age of Onset  . Diabetes type II Mother   . Hypertension Mother   . Liver cancer Father   . Diabetes type II Sister   . Breast cancer Sister   . Diabetes type II Brother   . Kidney failure Brother   . Diabetes type II Sister    Social History:  reports that he has been smoking Cigars.  He has never used smokeless tobacco. He reports that he drinks about 21.0 oz of alcohol per week. He reports that he does not use illicit drugs.  Allergies:  Allergies  Allergen Reactions  . Sulfa Antibiotics Rash     (Not in a hospital admission)    Review of Systems - chronic shortness of breath present. Diabetes mellitus uncontrolled. No symptoms of claudication or TIA. No recent weight changes. No bowel or bladder disturbances. Other systems negative.  Blood pressure 150/68, pulse 68, resp. rate 16, SpO2 97 %. General appearance: alert, cooperative, appears older than stated age, no distress  and moderately obese Eyes: negative findings: lids and lashes normal Neck: no adenopathy, no JVD, supple, symmetrical, trachea midline and Short neck, faint bilateral carotid bruit present. Neck: JVP - normal, carotids 2+= without bruits Resp: clear to auscultation bilaterally Chest wall: no tenderness Cardio: regular rate and rhythm, S1, S2 normal, no murmur, click, rub or gallop GI: soft, non-tender; bowel sounds normal; no masses,  no organomegaly and Obese Extremities: Left Sim's amputation  noted. No edema. Full range of movements in all 4 activities. Pulses: Bilateral femoral bruit present. Very distant pulses. Popliteal could not be felt bilaterally. Dorsalis pedis on the right and left is 1+ PT absent bilaterally. Skin: Skin color, texture, turgor normal. No rashes or lesions Neurologic: Grossly normal  Results for orders placed or performed during the hospital encounter of 10/07/14 (from the past 48 hour(s))  CBC with Differential     Status: Abnormal   Collection Time: 10/07/14  4:36 PM  Result Value Ref Range   WBC 10.0 4.0 - 10.5 K/uL   RBC 4.05 (L) 4.22 - 5.81 MIL/uL   Hemoglobin 12.4 (L) 13.0 - 17.0 g/dL   HCT 34.6 (L) 39.0 - 52.0 %   MCV 85.4 78.0 - 100.0 fL   MCH 30.6 26.0 - 34.0 pg   MCHC 35.8 30.0 - 36.0 g/dL   RDW 13.1 11.5 - 15.5 %   Platelets 267 150 - 400 K/uL   Neutrophils Relative % 72 43 - 77 %   Neutro Abs 7.3 1.7 - 7.7 K/uL   Lymphocytes Relative 19 12 - 46 %   Lymphs Abs 1.9 0.7 - 4.0 K/uL   Monocytes Relative 7 3 - 12 %   Monocytes Absolute 0.7 0.1 - 1.0 K/uL   Eosinophils Relative 1 0 - 5 %   Eosinophils Absolute 0.1 0.0 - 0.7 K/uL   Basophils Relative 1 0 - 1 %   Basophils Absolute 0.1 0.0 - 0.1 K/uL  Comprehensive metabolic panel     Status: Abnormal   Collection Time: 10/07/14  4:36 PM  Result Value Ref Range   Sodium 136 (L) 137 - 147 mEq/L   Potassium 4.7 3.7 - 5.3 mEq/L   Chloride 103 96 - 112 mEq/L   CO2 17 (L) 19 - 32 mEq/L   Glucose, Bld 194 (H) 70 - 99 mg/dL   BUN 22 6 - 23 mg/dL   Creatinine, Ser 1.32 0.50 - 1.35 mg/dL   Calcium 8.6 8.4 - 10.5 mg/dL   Total Protein 6.4 6.0 - 8.3 g/dL   Albumin 3.0 (L) 3.5 - 5.2 g/dL   AST 22 0 - 37 U/L   ALT 33 0 - 53 U/L   Alkaline Phosphatase 100 39 - 117 U/L   Total Bilirubin 0.5 0.3 - 1.2 mg/dL   GFR calc non Af Amer 59 (L) >90 mL/min   GFR calc Af Amer 69 (L) >90 mL/min    Comment: (NOTE) The eGFR has been calculated using the CKD EPI equation. This calculation has not been  validated in all clinical situations. eGFR's persistently <90 mL/min signify possible Chronic Kidney Disease.    Anion gap 16 (H) 5 - 15  Magnesium     Status: None   Collection Time: 10/07/14  4:36 PM  Result Value Ref Range   Magnesium 2.0 1.5 - 2.5 mg/dL  I-stat troponin, ED     Status: None   Collection Time: 10/07/14  4:51 PM  Result Value Ref Range   Troponin i, poc  0.02 0.00 - 0.08 ng/mL   Comment 3            Comment: Due to the release kinetics of cTnI, a negative result within the first hours of the onset of symptoms does not rule out myocardial infarction with certainty. If myocardial infarction is still suspected, repeat the test at appropriate intervals.   I-stat Chem 8, ED     Status: Abnormal   Collection Time: 10/07/14  4:53 PM  Result Value Ref Range   Sodium 138 137 - 147 mEq/L   Potassium 4.4 3.7 - 5.3 mEq/L   Chloride 106 96 - 112 mEq/L   BUN 22 6 - 23 mg/dL   Creatinine, Ser 1.50 (H) 0.50 - 1.35 mg/dL   Glucose, Bld 204 (H) 70 - 99 mg/dL   Calcium, Ion 1.11 (L) 1.12 - 1.23 mmol/L   TCO2 19 0 - 100 mmol/L   Hemoglobin 12.6 (L) 13.0 - 17.0 g/dL   HCT 37.0 (L) 39.0 - 52.0 %   Ct Head Wo Contrast  10/07/2014   CLINICAL DATA:  Syncopal episode. Lightheadedness. Recent colonoscopy.  EXAM: CT HEAD WITHOUT CONTRAST  TECHNIQUE: Contiguous axial images were obtained from the base of the skull through the vertex without intravenous contrast.  COMPARISON:  None.  FINDINGS: No acute intracranial hemorrhage. No focal mass lesion. No CT evidence of acute infarction. No midline shift or mass effect. No hydrocephalus. Basilar cisterns are patent.  There are several focal periventricular white matter hypodensities on the left.  Paranasal sinuses and  mastoid air cells are clear.  IMPRESSION: 1. No clear acute intracranial findings. 2. Focal hypodensities within the left deep white matter likely represent chronic microvascular disease however are age indeterminate.    Electronically Signed   By: Suzy Bouchard M.D.   On: 10/07/2014 17:37    Labs:   Lab Results  Component Value Date   WBC 10.0 10/07/2014   HGB 12.6* 10/07/2014   HCT 37.0* 10/07/2014   MCV 85.4 10/07/2014   PLT 267 10/07/2014    Recent Labs Lab 10/07/14 1636 10/07/14 1653  NA 136* 138  K 4.7 4.4  CL 103 106  CO2 17*  --   BUN 22 22  CREATININE 1.32 1.50*  CALCIUM 8.6  --   PROT 6.4  --   BILITOT 0.5  --   ALKPHOS 100  --   ALT 33  --   AST 22  --   GLUCOSE 194* 204*   Lab Results  Component Value Date   TROPONINI <0.30 09/30/2014    Lipid Panel     Component Value Date/Time   CHOL 147 11/20/2013 1422   TRIG 160* 11/20/2013 1422   HDL 30* 11/20/2013 1422   CHOLHDL 4.9 11/20/2013 1422   VLDL 32 11/20/2013 1422   LDLCALC 85 11/20/2013 1422    EKG: 10/07/2014: Normal sinus rhythm, left axis deviation, left anterior fascicular block. Right bundle branch block. Bifascicular block.   Assessment/Plan 1. Sick sinus syndrome, 2. Paroxysmal atrial fibrillation 3. Atrial tachycardia, patient converted from sinus rhythm to atrial fibrillation in the Cath Lab, then developed what appears to be atrial tachycardia with 2-1 conduction. 4. Diabetes mellitus type 2 uncontrolled 5. Peripheral arterial disease with history of left femoropopliteal bypass surgery in the past. 6. Hypertension, presently blood pressure borderline low. 7. Hyperlipidemia  Recommendation: We will take him to the Cath Lab to place a temporary pacemaker due to recurrent episodes of syncope, patient has had 6-8 episodes  of syncope and witnessed ventricle standstill. I will start the patient on amiodarone as his blood pressure has been low and patient is now in atrial fibrillation with rapid ventricle response. Will also perform left heart catheterization with limited contrast used to exclude ischemic etiology in a very high risk individual. Further conditions will follow. I have discussed with the  patient and his wife at the bedside regarding the risks, benefits and alternatives to coronary angiography and also placement of temporary pacemaker. They understand less than 1% risk of death, stroke, MI, need for urgent CABG but not limited to these. Contrast nephropathy was also discussed.  Laverda Page, MD 10/07/2014, 6:06 PM [facility]. [place] Pager: [phone] Office: [phone] If no answer: Cell:  [phone]

## 2014-10-07 NOTE — ED Provider Notes (Signed)
I saw and evaluated the patient, reviewed the resident's note and I agree with the findings and plan.   EKG Interpretation   Date/Time:  Thursday October 07 2014 16:32:52 EST Ventricular Rate:  76 PR Interval:  182 QRS Duration: 157 QT Interval:  441 QTC Calculation: 496 R Axis:   -75 Text Interpretation:  Sinus rhythm RBBB and LAFB Confirmed by Hazle Coca  [phone]) on 10/07/2014 4:59:47 PM      CRITICAL CARE Performed by: Quintella Reichert   Total critical care time: 30  Critical care time was exclusive of separately billable procedures and treating other patients.  Critical care was necessary to treat or prevent imminent or life-threatening deterioration.  Critical care was time spent personally by me on the following activities: development of treatment plan with patient and/or surrogate as well as nursing, discussions with consultants, evaluation of patient's response to treatment, examination of patient, obtaining history from patient or surrogate, ordering and performing treatments and interventions, ordering and review of laboratory studies, ordering and review of radiographic studies, pulse oximetry and re-evaluation of patient's condition.  Patient presents emergency department for syncopal episodes. Patient with multiple syncopal episodes at home today as well as one witnessed by EMS and one in the emergency department. Patient reports feeling lightheaded before these episodes becomes completely unresponsive. On the monitor during episodes patient is in asystole. We were unable to capture) these episodes but they last approximately 5-10 seconds each. Following the episodes patient has no recollection of event but has no complaints. Review of EKG rhythm strip shows an episode of A. fib with RVR as well as sinus rhythm. In the emergency department patient has had sinus rhythm alternating with asystole. Discussed with cardiology who will evaluate patient and place temporary  pacemaker.  Quintella Reichert, MD 10/07/14 1958

## 2014-10-07 NOTE — CV Procedure (Signed)
Procedure performed:  Left heart catheterization including hemodynamic monitoring of the left ventricle, LV gram, selective right and left coronary arteriography. Placement of a temporary transvenous pacemaker for ventricular standstill and asystole.  Indication patient is a 53 year-old Caucasian male with history of hypertension (), hyperlipidemia (), Diabetes Mellitus ()  who presents with cardiac syncope with episodes of ventricle standstill and witnessed syncope and witnessed ventricle standstill in the emergency room, patient also developed paroxysmal episodes of atrial fibrillation with rapid ventricle response. He was brought for placement of a temporary pacemaker, due to ongoing cardio vascular risk factors including known peripheral arterial disease, ongoing tobacco use disorder, felt that cardiac etiology including ischemic etiology needs to be excluded. Hence also underwent left heart catheterization with limited contrast use due to chronic kidney disease.  Hemodynamic data:  Left ventricular pressure was 112/3with LVEDP of 7 mm mercury. Aortic pressure was 116/60 with a mean of 75 mm mercury. There was no pressure gradient across the aortic valve.   Left ventricle: Performed in the RAO projection revealed LVEF of 60%. No gross wall motion abnormality noted. 5 mL of contrast was utilized. Hand injection.  Right coronary artery: A  small vessel, codominant with the circumflex coronary artery. Very small PDA branches arise from the distal bed. Proximal RCA has a 80% stenosis at the crux followed by tandem  high-grade 90% stenosis to 95% stenosis in the mid segment proximal to the origin of the RV branch. It is at most a 2.5 mm vessel in the proximal end.  Left main coronary artery is large and normal.   Circumflex coronary artery: A large vessel giving origin to a large obtuse marginal 1. This is also calcified. Distal segment gives origin to PDA and PL branches. Distal circumflex coronary  artery prior to the origin of these branches has a high-grade 80% stenosis  LAD:  LAD gives origin to a large diagonal 1. Just at the origin of D1, there is a high-grade 90% stenosis in LAD followed by a long segment diffuse 70-80% calcified eccentric stenosis in the midsegment.   Pacemaker insertion: Temporary transvenous pacemaker inserted via right femoral vein. Capture obtained at 1 mV. Settings were 5 mV, 60 bpm on demand. VVI.  Recommendation: Patient needs permanent pacemaker insertion.Coronary artery disease appears to be chronic and stable, but he will need coronary intervention due to high-grade mid LAD stenosis and also high-grade distal dominant circumflex coronary artery stenosis. These are amenable for percutaneous prevascular station. He'll need aggressive risk factor modification specifically smoking cessation.    Technique: Under sterile precautions using a 6 French right femoral venous access, a 6 French sheath was introduced into the right femoral vein under fluoroscopy guidance. A 5 French right femoral arterial access was obtained and a 5 [place] sheath was introduced. A temporary transvenous pacemaker was then advanced under fluoroscopic guidance with balloon inflated into the right ventricle without any problems or complications. Excellent capture was obtained.  A 5 [place] multipurpose B2 catheter was advanced into the ascending aorta and then into the left ventricle. Hemodynamics are analysed. LV angiogram   performed in RAO projection. Catheter pulled into the ascending aorta left main coronary artery was cannulated, and angiography was performed, to conserve contrast and selective visualization, the catheter was pulled out of the body and a 5 French Judkins left 4 diagnostic catheter was utilized to engage the left main coronary artery. Vertebral views were obtained, followed by exchanging to a 5 [place] Judkins right 4 diagnostic catheter and  RCA was cannulated and angiography was  performed. Catheter was then pulled out of the body over the J-wire. Patient also procedure well. A total of 40 mL of contrast was utilized for diagnostic angiography.
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
## 2014-10-08 ENCOUNTER — Encounter (HOSPITAL_COMMUNITY)
Admission: EM | Disposition: A | Discharge status: Home / Self Care | Payer: Medicaid Other | Source: Home / Self Care | Attending: Cardiology

## 2014-10-08 ENCOUNTER — Inpatient Hospital Stay (HOSPITAL_COMMUNITY): Payer: Medicaid Other

## 2014-10-08 ENCOUNTER — Inpatient Hospital Stay (HOSPITAL_COMMUNITY): Admission: RE | Admit: 2014-10-08 | Payer: Medicaid Other | Source: Ambulatory Visit

## 2014-10-08 ENCOUNTER — Encounter (HOSPITAL_COMMUNITY): Payer: Self-pay | Admitting: Cardiology

## 2014-10-08 DIAGNOSIS — I495 Sick sinus syndrome: Secondary | ICD-10-CM

## 2014-10-08 DIAGNOSIS — Z950 Presence of cardiac pacemaker: Secondary | ICD-10-CM | POA: Insufficient documentation

## 2014-10-08 DIAGNOSIS — R55 Syncope and collapse: Secondary | ICD-10-CM

## 2014-10-08 DIAGNOSIS — Z45018 Encounter for adjustment and management of other part of cardiac pacemaker: Secondary | ICD-10-CM

## 2014-10-08 DIAGNOSIS — I481 Persistent atrial fibrillation: Secondary | ICD-10-CM

## 2014-10-08 HISTORY — PX: PACEMAKER INSERTION: SHX728

## 2014-10-08 HISTORY — DX: Presence of cardiac pacemaker: Z95.0

## 2014-10-08 HISTORY — DX: Sick sinus syndrome: I49.5

## 2014-10-08 HISTORY — DX: Encounter for adjustment and management of other part of cardiac pacemaker: Z45.018

## 2014-10-08 HISTORY — PX: PERMANENT PACEMAKER INSERTION: SHX5480

## 2014-10-08 LAB — TROPONIN I: Troponin I: <0.3 ng/mL (ref <0.30)

## 2014-10-08 LAB — GLUCOSE, CAPILLARY
GLUCOSE-CAPILLARY: 164 mg/dL — AB (ref 70–99)
Glucose-Capillary: 212 mg/dL — ABNORMAL HIGH (ref 70–99)
Glucose-Capillary: 282 mg/dL — ABNORMAL HIGH (ref 70–99)
Glucose-Capillary: 260 mg/dL — ABNORMAL HIGH (ref 70–99)

## 2014-10-08 LAB — BASIC METABOLIC PANEL
Anion gap: 13 (ref 5–15)
BUN: 23 mg/dL (ref 6–23)
CHLORIDE: 107 meq/L (ref 96–112)
CO2: 21 meq/L (ref 19–32)
Calcium: 7.9 mg/dL — ABNORMAL LOW (ref 8.4–10.5)
Creatinine, Ser: 1.43 mg/dL — ABNORMAL HIGH (ref 0.50–1.35)
GFR calc Af Amer: 62 mL/min — ABNORMAL LOW (ref >90)
GFR, EST NON AFRICAN AMERICAN: 54 mL/min — AB (ref >90)
GLUCOSE: 217 mg/dL — AB (ref 70–99)
POTASSIUM: 4.6 meq/L (ref 3.7–5.3)
SODIUM: 141 meq/L (ref 137–147)

## 2014-10-08 LAB — CBC
HCT: 31.9 % — ABNORMAL LOW (ref 39.0–52.0)
HEMOGLOBIN: 10.8 g/dL — AB (ref 13.0–17.0)
MCH: 29.7 pg (ref 26.0–34.0)
MCHC: 33.9 g/dL (ref 30.0–36.0)
MCV: 87.6 fL (ref 78.0–100.0)
Platelets: 233 10*3/uL (ref 150–400)
RBC: 3.64 MIL/uL — AB (ref 4.22–5.81)
RDW: 13.5 % (ref 11.5–15.5)
WBC: 6.5 10*3/uL (ref 4.0–10.5)

## 2014-10-08 LAB — CALCIUM, IONIZED: Calcium, Ion: 1.21 mmol/L (ref 1.12–1.23)

## 2014-10-08 LAB — HEPARIN LEVEL (UNFRACTIONATED): Heparin Unfractionated: <0.1 IU/mL — ABNORMAL LOW (ref 0.30–0.70)

## 2014-10-08 LAB — TSH: TSH: 1.48 u[IU]/mL (ref 0.350–4.500)

## 2014-10-08 SURGERY — PERMANENT PACEMAKER INSERTION
Anesthesia: LOCAL

## 2014-10-08 MED ORDER — FENTANYL CITRATE 0.05 MG/ML IJ SOLN
INTRAMUSCULAR | Status: AC
  Filled 2014-10-08: qty 2

## 2014-10-08 MED ORDER — SODIUM CHLORIDE 0.9 % IV SOLN
250.0000 mL | INTRAVENOUS | Status: DC
Start: 2014-10-08 — End: 2014-10-08

## 2014-10-08 MED ORDER — MIDAZOLAM HCL 5 MG/5ML IJ SOLN
INTRAMUSCULAR | Status: AC
  Filled 2014-10-08: qty 5

## 2014-10-08 MED ORDER — SODIUM CHLORIDE 0.9 % IV SOLN: 250.0000 mL | INTRAVENOUS | Status: DC

## 2014-10-08 MED ORDER — MAGNESIUM HYDROXIDE 400 MG/5ML PO SUSP: 30.0000 mL | ORAL | Status: DC | PRN

## 2014-10-08 MED ORDER — HEPARIN (PORCINE) IN NACL 2-0.9 UNIT/ML-% IJ SOLN
INTRAMUSCULAR | Status: AC
  Filled 2014-10-08: qty 500

## 2014-10-08 MED ORDER — SODIUM CHLORIDE 0.9 % IJ SOLN: 3.0000 mL | INTRAMUSCULAR | Status: DC | PRN

## 2014-10-08 MED ORDER — LIDOCAINE HCL (PF) 1 % IJ SOLN
INTRAMUSCULAR | Status: AC
  Filled 2014-10-08: qty 30

## 2014-10-08 MED ORDER — CEFAZOLIN SODIUM-DEXTROSE 2-3 GM-% IV SOLR
2.0000 g | INTRAVENOUS | Status: DC
  Filled 2014-10-08: qty 50

## 2014-10-08 MED ORDER — CHLORHEXIDINE GLUCONATE 4 % EX LIQD
60.0000 mL | Freq: Once | CUTANEOUS | Status: AC
  Administered 2014-10-08: 4 via TOPICAL
  Filled 2014-10-08: qty 30

## 2014-10-08 MED ORDER — YOU HAVE A PACEMAKER BOOK
Freq: Once | Status: AC
  Administered 2014-10-08: 20:00:00
  Filled 2014-10-08: qty 1

## 2014-10-08 MED ORDER — CEFAZOLIN SODIUM-DEXTROSE 2-3 GM-% IV SOLR
INTRAVENOUS | Status: AC
  Filled 2014-10-08: qty 50

## 2014-10-08 MED ORDER — SODIUM CHLORIDE 0.9 % IR SOLN
80.0000 mg | Status: DC
  Filled 2014-10-08: qty 2

## 2014-10-08 MED ORDER — SODIUM CHLORIDE 0.9 % IV SOLN: INTRAVENOUS | Status: DC

## 2014-10-08 MED ORDER — INSULIN ASPART 100 UNIT/ML ~~LOC~~ SOLN
0.0000 [IU] | Freq: Three times a day (TID) | SUBCUTANEOUS | Status: DC
  Administered 2014-10-08: 18:00:00 11 [IU] via SUBCUTANEOUS
  Administered 2014-10-09: 4 [IU] via SUBCUTANEOUS

## 2014-10-08 MED ORDER — SODIUM CHLORIDE 0.9 % IJ SOLN: 3.0000 mL | Freq: Two times a day (BID) | INTRAMUSCULAR | Status: DC

## 2014-10-08 MED ORDER — CHLORHEXIDINE GLUCONATE 4 % EX LIQD
60.0000 mL | Freq: Once | CUTANEOUS | Status: DC
  Filled 2014-10-08: qty 60

## 2014-10-08 MED ORDER — AMIODARONE HCL 200 MG PO TABS
400.0000 mg | ORAL_TABLET | Freq: Two times a day (BID) | ORAL | Status: DC
  Administered 2014-10-08 – 2014-10-09 (×2): 400 mg via ORAL
  Filled 2014-10-08 (×3): qty 2

## 2014-10-08 MED ORDER — ONDANSETRON HCL 4 MG/2ML IJ SOLN: 4.0000 mg | Freq: Four times a day (QID) | INTRAMUSCULAR | Status: DC | PRN

## 2014-10-08 MED ORDER — CEFAZOLIN SODIUM 1-5 GM-% IV SOLN
1.0000 g | Freq: Four times a day (QID) | INTRAVENOUS | Status: AC
  Administered 2014-10-08 – 2014-10-09 (×3): 1 g via INTRAVENOUS
  Filled 2014-10-08 (×3): qty 50

## 2014-10-08 MED ORDER — ACETAMINOPHEN 325 MG PO TABS: 325.0000 mg | ORAL_TABLET | ORAL | Status: DC | PRN

## 2014-10-08 MED ORDER — SODIUM CHLORIDE 0.9 % IV SOLN
INTRAVENOUS | Status: DC
Start: 2014-10-08 — End: 2014-10-08
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
## 2014-10-08 NOTE — Plan of Care (Signed)
Problem: Consults Goal: Permanent Pacemaker Patient Education (See Patient Education module for education specifics.) Outcome: Progressing

## 2014-10-08 NOTE — Progress Notes (Signed)
Site area: rt groin Site Prior to Removal:  Level 0 Pressure Applied For: 10 minutes Manual:   yes Patient Status During Pull:  stable Post Pull Site:  Level 0 Post Pull Instructions Given:  yes Post Pull Pulses Present: yes Dressing Applied:  tegaderm Bedrest begins @ 1410 Comments: no complications

## 2014-10-08 NOTE — Progress Notes (Signed)
Pt sent to cath lab with cath lab nurse for pacemaker placement. Pre procedure orders completed.

## 2014-10-08 NOTE — Progress Notes (Signed)
Echocardiogram 2D Echocardiogram has been performed.  [name] 10/08/2014, 11:25 AM

## 2014-10-08 NOTE — Progress Notes (Signed)
Arm sling applied to left arm. IV d/c'ed from left hand. PCXR called for.

## 2014-10-08 NOTE — CV Procedure (Signed)
Preop DX:: atrial fibrillation post termination pauses and syncope  Post op DX:: same  Procedure    dual pacemaker implantation  After routine prep and drape, lidocaine was infiltrated in the prepectoral subclavicular region on the left side an incision was made and carried down to later the prepectoral fascia using electrocautery and sharp dissection a pocket was formed similarly. Hemostasis was obtained.  After this, we turned our attention to gaining accessm to the extrathoracic,left subclavian vein. This was accomplished with much difficulty but  without the aspiration of air but 2X  puncture of the artery. Pressure was h eld on both occasions for >28min  A venograM proved to be insufficiendt for viewing and given modest renal insufficiency elected tno to undertake a second time Ultimately a standard subclavian puncture was udnertaken and this required tissue dilitation   Finally two guidewires were placed and retained and sequentially 7 French sheath through which were  passed a Medtronic MRI compatible 5076 ventricular lead serial [id number] and an Medtronic MRI compatible 5076 atrial lead serial number [id number] .  The ventricular lead was manipulated to the right ventricular apex with a bipolar R wave was 5.4, the pacing impedance was 1053, the threshold was 0.6 @ 0.5 msec  Current at threshold was   O.6  Ma and the current of injury was  BRISK.  The right atrial lead was manipulated to the right atrial appendage with a bipolar P-wave  1.4, the pacing impedance was 594, the threshold 1.2@ 0.5 msec   Current at threshold was 2.2  Ma and the current of injury was BRISK.  The leads were affixed to the prepectoral fascia and attached to a  Medtronic MRI compatible  pulse generator serial number [id number].  Hemostasis was obtained. The pocket was copiously irrigated with antibiotic containing saline solution. The leads and the pulse generator were placed in the pocket and affixed to the  prepectoral fascia. The wound was then closed in 2 layers in the normal fashion.  The wound was washed dried . And a dermabond adhesive was applied  The transvenous pacer was removed while under flouro   Needle  Count, sponge counts and instrument counts were correct at the end of the procedure .   The patient tolerated the procedure without apparent complication   THERE WAS transient hypotension and flouro margin was ok no visualizied pneumonthorax  HR and O2 sats were stable    CXR pending  The .  Maryagnes Amos.D.

## 2014-10-08 NOTE — Progress Notes (Signed)
PCXR done

## 2014-10-09 ENCOUNTER — Inpatient Hospital Stay (HOSPITAL_COMMUNITY): Payer: Medicaid Other

## 2014-10-09 ENCOUNTER — Encounter (HOSPITAL_COMMUNITY): Payer: Self-pay | Admitting: *Deleted

## 2014-10-09 DIAGNOSIS — N183 Chronic kidney disease, stage 3 (moderate): Secondary | ICD-10-CM | POA: Diagnosis not present

## 2014-10-09 DIAGNOSIS — D631 Anemia in chronic kidney disease: Secondary | ICD-10-CM | POA: Diagnosis not present

## 2014-10-09 LAB — CBC
HEMATOCRIT: 30.6 % — AB (ref 39.0–52.0)
Hemoglobin: 10.2 g/dL — ABNORMAL LOW (ref 13.0–17.0)
MCH: 29.4 pg (ref 26.0–34.0)
MCHC: 33.3 g/dL (ref 30.0–36.0)
MCV: 88.2 fL (ref 78.0–100.0)
Platelets: 205 10*3/uL (ref 150–400)
RBC: 3.47 MIL/uL — AB (ref 4.22–5.81)
RDW: 13.5 % (ref 11.5–15.5)
WBC: 7.8 10*3/uL (ref 4.0–10.5)

## 2014-10-09 LAB — GLUCOSE, CAPILLARY: GLUCOSE-CAPILLARY: 195 mg/dL — AB (ref 70–99)

## 2014-10-09 MED ORDER — METOPROLOL TARTRATE 100 MG PO TABS: 50.0000 mg | ORAL_TABLET | Freq: Two times a day (BID) | ORAL | Status: DC

## 2014-10-09 MED ORDER — AMLODIPINE BESYLATE 10 MG PO TABS: 10.0000 mg | ORAL_TABLET | Freq: Every day | ORAL | Status: DC

## 2014-10-09 MED ORDER — AMIODARONE HCL 400 MG PO TABS: 400.0000 mg | ORAL_TABLET | Freq: Two times a day (BID) | ORAL | Status: DC

## 2014-10-09 MED ORDER — AMLODIPINE BESYLATE 10 MG PO TABS
10.0000 mg | ORAL_TABLET | Freq: Every day | ORAL | Status: DC
  Administered 2014-10-09: 10 mg via ORAL
  Filled 2014-10-09: qty 1

## 2014-10-09 MED ORDER — APIXABAN 5 MG PO TABS: 5.0000 mg | ORAL_TABLET | Freq: Two times a day (BID) | ORAL | Status: DC

## 2014-10-09 NOTE — Discharge Instructions (Signed)
Supplemental Discharge Instructions for  Pacemaker Patients  Activity No heavy lifting or vigorous activity with your left arm for 6 to 8 weeks.  Do not raise your left arm above your head for one week.  Gradually raise your affected arm as drawn below.           __11/28/15                       10/11/14                     10/13/14                10/15/14   NO DRIVING for     ; you may begin driving on     .  WOUND CARE - Keep the wound area clean and dry.  Do not get this area wet for one week. No showers for one week; you may shower on  10/14/14   . - The tape/steri-strips on your wound will fall off; do not pull them off.  No bandage is needed on the site.  DO  NOT apply any creams, oils, or ointments to the wound area. - If you notice any drainage or discharge from the wound, any swelling or bruising at the site, or you develop a fever > 101? F after you are discharged home, call the office at once.  Special Instructions - You are still able to use cellular telephones; use the ear opposite the side where you have your pacemaker/defibrillator.  Avoid carrying your cellular phone near your device. - When traveling through airports, show security personnel your identification card to avoid being screened in the metal detectors.  Ask the security personnel to use the hand wand. - Avoid arc welding equipment, MRI testing (magnetic resonance imaging), TENS units (transcutaneous nerve stimulators).  Call the office for questions about other devices. - Avoid electrical appliances that are in poor condition or are not properly grounded. - Microwave ovens are safe to be near or to operate.   Information on my medicine - ELIQUIS (apixaban)  This medication education was reviewed with me or my healthcare representative as part of my discharge preparation.  The pharmacist that spoke with me during my hospital stay was:  [facility], Margot Chimes, [facility]  Why was Eliquis prescribed for  you? Eliquis was prescribed for you to reduce the risk of a blood clot forming that can cause a stroke if you have a medical condition called atrial fibrillation (a type of irregular heartbeat).  What do You need to know about Eliquis ? Take your Eliquis TWICE DAILY - one tablet in the morning and one tablet in the evening with or without food. If you have difficulty swallowing the tablet whole please discuss with your pharmacist how to take the medication safely.  Take Eliquis exactly as prescribed by your doctor and DO NOT stop taking Eliquis without talking to the doctor who prescribed the medication.  Stopping may increase your risk of developing a stroke.  Refill your prescription before you run out.  After discharge, you should have regular check-up appointments with your healthcare provider that is prescribing your Eliquis.  In the future your dose may need to be changed if your kidney function or weight changes by a significant amount or as you get older.  What do you do if you miss a dose? If you miss a dose, take it as soon as you remember  on the same day and resume taking twice daily.  Do not take more than one dose of ELIQUIS at the same time to make up a missed dose.  Important Safety Information A possible side effect of Eliquis is bleeding. You should call your healthcare provider right away if you experience any of the following: ? Bleeding from an injury or your nose that does not stop. ? Unusual colored urine (red or dark brown) or unusual colored stools (red or black). ? Unusual bruising for unknown reasons. ? A serious fall or if you hit your head (even if there is no bleeding).  Some medicines may interact with Eliquis and might increase your risk of bleeding or clotting while on Eliquis. To help avoid this, consult your healthcare provider or pharmacist prior to using any new prescription or non-prescription medications, including herbals, vitamins, non-steroidal  anti-inflammatory drugs (NSAIDs) and supplements.  This website has more information on Eliquis (apixaban): http://www.eliquis.com/eliquis/home

## 2014-10-09 NOTE — Progress Notes (Signed)
Notified Dr [name] of BP ranging from 190's-234 over 82-91, new orders received for Norvasc as well as order to give toprol early.  Also let him know that pt was on hydralazine 100 3x a day at home but was not ordered here.  He said he would evaluate when he rounds on pt.

## 2014-10-09 NOTE — Discharge Summary (Signed)
Physician Discharge Summary  Patient ID: Alexander [name] MRN: [id number] DOB/AGE: 04-06-1961 53 y.o.  Admit date: 10/07/2014 Discharge date: 10/09/2014  Primary Discharge Diagnosis:  1. Syncope, cardiogenic, due to ventricle standstill.  2. Sick sinus syndrome, Paroxysmal atrial fibrillation, frequent episodes of ventricle standstill on the day of admission leading to syncope. 3. Atrial tachycardia, patient converted from sinus rhythm to atrial fibrillation in the Cath Lab, then developed what appears to be atrial tachycardia with 2-1 conduction. 4. Diabetes mellitus type 2 uncontrolled 5. Peripheral arterial disease with history of left femoropopliteal bypass surgery in the past. Patient follows Dr. Shanon Rosser. 6. Hypertension 7. Hyperlipidemia  Significant Diagnostic Studies: Echocardiogram 10/08/2014: Left ventricle: The cavity size was normal. There was severe concentric hypertrophy. Systolic function was normal. Wall motionwas normal; there were no regional wall motion abnormalities. Features are consistent with a pseudonormal left ventricular filling pattern, with concomitant abnormal relaxation and increased filling pressure (grade 2 diastolic dysfunction). Doppler parameters are consistent with both elevated ventricular end-diastolic filling pressure and elevated left atrial filling pressure.- Left atrium: The atrium was mildly to moderately dilated.  Coronary angiogram 10/07/2014: Hemodynamic data:  Left ventricular pressure was 112/3with LVEDP of 7 mm mercury. Aortic pressure was 116/60 with a mean of 75 mm mercury. There was no pressure gradient across the aortic valve.   Left ventricle: Performed in the RAO projection revealed LVEF of 60%. No gross wall motion abnormality noted. 5 mL of contrast was utilized. Hand injection.  Right coronary artery: A small vessel, codominant with the circumflex coronary artery. Very small PDA branches arise from the distal bed.  Proximal RCA has a 80% stenosis at the crux followed by tandem high-grade 90% stenosis to 95% stenosis in the mid segment proximal to the origin of the RV branch. It is at most a 2.5 mm vessel in the proximal end.  Left main coronary artery is large and normal.   Circumflex coronary artery: A large vessel giving origin to a large obtuse marginal 1. This is also calcified. Distal segment gives origin to PDA and PL branches. Distal circumflex coronary artery prior to the origin of these branches has a high-grade 80% stenosis  LAD: LAD gives origin to a large diagonal 1. Just at the origin of D1, there is a high-grade 90% stenosis in LAD followed by a long segment diffuse 70-80% calcified eccentric stenosis in the midsegment.   Pacemaker insertion: Temporary transvenous pacemaker inserted via right femoral vein. Capture obtained at 1 mV. Settings were 5 mV, 60 bpm on demand. VVI.  Placement of permanent transvenous pacemaker by Dr. Virl Axe 10/08/2014: Medtronic MRI compatible 5076 atrial lead serial number [id number]   Consults:  Dr. Virl Axe for sick sinus syndrome, ventricle standstill and syncope.  Hospital Course: Patient is a 53 year old Caucasian male with history of hypertension, hyperlipidemia, diabetes mellitus which is uncontrolled, peripheral arterial disease with history of left femoropopliteal bypass surgery and amputation of the left foot, continued tobacco use disorder, admitted via emergency room after he presented with recurrent episodes of syncope. While on route to the emergency room, patient was found to be in paroxysmal episodes of atrial fibrillation. He also had episodes of 6-8 second ventricle standstill leading to syncope. Patient also had a witnessed episode of syncope with about 6-8 seconds of ventricular standstill in the emergency room, however telemetry strips were not obtained. Temporary transvenous pacemaker was inserted via right femoral vein. Catheterization  also revealed significant CAD.  Dual pacemaker was placed the following day.  Recommendations on discharge: Coronary artery disease appears to be chronic and stable, but he will need coronary intervention due to high-grade mid LAD stenosis and also high-grade distal dominant circumflex coronary artery stenosis. These are amenable for percutaneous revascularization.  He will need at least 3 weeks to heal from pacemaker insertion prior to angioplasty.  He'll need aggressive risk factor modification, specifically smoking cessation until then.  Discharge Exam: Blood pressure 194/52, pulse 61, temperature 97.7 F (36.5 C), temperature source Oral, resp. rate 18, height 6' (1.829 m), weight 113 kg (249 lb 1.9 oz), SpO2 98 %.   General appearance: alert, cooperative, appears older than stated age, no distress and moderately obese Resp: clear to auscultation bilaterally Chest wall: left sided chest wall tenderness, pacemaker insertion site Cardio: regular rate and rhythm, S1, S2 normal, no murmur, click, rub or gallop Extremities: left Sim's amputation noted. No edema. Full range of movements in all 4 extremities Pulses: Bilateral femoral bruit present. Very distant pulses. Popliteal could not be felt bilaterally. Dorsalis pedis on the right and left is 1+ PT absent bilaterally.  Labs:   Lab Results  Component Value Date   WBC 7.8 10/09/2014   HGB 10.2* 10/09/2014   HCT 30.6* 10/09/2014   MCV 88.2 10/09/2014   PLT 205 10/09/2014    Recent Labs Lab 10/07/14 1636  10/08/14 0941  NA 136*  < > 141  K 4.7  < > 4.6  CL 103  < > 107  CO2 17*  --  21  BUN 22  < > 23  CREATININE 1.32  < > 1.43*  CALCIUM 8.6  --  7.9*  PROT 6.4  --   --   BILITOT 0.5  --   --   ALKPHOS 100  --   --   ALT 33  --   --   AST 22  --   --   GLUCOSE 194*  < > 217*  < > = values in this interval not displayed. Lab Results  Component Value Date   TROPONINI <0.30 10/08/2014    Lipid Panel     Component Value  Date/Time   CHOL 147 11/20/2013 1422   TRIG 160* 11/20/2013 1422   HDL 30* 11/20/2013 1422   CHOLHDL 4.9 11/20/2013 1422   VLDL 32 11/20/2013 1422   LDLCALC 85 11/20/2013 1422         Component Value Date/Time   CHOL 147 11/20/2013 1422   TRIG 160* 11/20/2013 1422   HDL 30* 11/20/2013 1422   CHOLHDL 4.9 11/20/2013 1422   VLDL 32 11/20/2013 1422   LDLCALC 85 11/20/2013 1422      EKG: 10/07/2014: Normal sinus rhythm, left axis deviation, left anterior fascicular block. Right bundle branch block. Bifascicular block. EKG 10/09/2014: A paced rhythm, right bundle branch block. Telemetry 10/07/2014: Paroxysmal episodes of atrial fibrillation with rapid ventricular response, frequent episodes of ventricle standstill between 6-8 seconds.    Radiology: Dg Chest 2 View  10/09/2014   CLINICAL DATA:  Atrial fibrillation. Syncope. Dual pacemaker implantation.  EXAM: CHEST  2 VIEW  COMPARISON:  Chest x-rays dated 09/07/2014 and 04/30/2014  FINDINGS: Heart size and pulmonary vascularity are normal and the lungs are clear. Dual lead pacemaker in place. No pneumothorax or effusion. No acute osseous abnormality. Slight reversal of the normal thoracic kyphosis, unchanged.  IMPRESSION: No acute abnormalities.  Pacemaker in place.   Electronically Signed   By: Rozetta Nunnery M.D.   On: 10/09/2014 08:45   Ct  Head Wo Contrast  10/07/2014   CLINICAL DATA:  Syncopal episode. Lightheadedness. Recent colonoscopy.  EXAM: CT HEAD WITHOUT CONTRAST  TECHNIQUE: Contiguous axial images were obtained from the base of the skull through the vertex without intravenous contrast.  COMPARISON:  None.  FINDINGS: No acute intracranial hemorrhage. No focal mass lesion. No CT evidence of acute infarction. No midline shift or mass effect. No hydrocephalus. Basilar cisterns are patent.  There are several focal periventricular white matter hypodensities on the left.  Paranasal sinuses and  mastoid air  cells are clear.  IMPRESSION: 1. No clear acute intracranial findings. 2. Focal hypodensities within the left deep white matter likely represent chronic microvascular disease however are age indeterminate.   Electronically Signed   By: Suzy Bouchard M.D.   On: 10/07/2014 17:37   Dg Chest Port 1 View  10/08/2014   CLINICAL DATA:  53 year old following pacer placement  EXAM: PORTABLE CHEST - 1 VIEW  COMPARISON:  10/07/2014  FINDINGS: The cardiac silhouette is enlarged. The mediastinal contours are within normal limits.  A left approach pacing device is present with tip projecting over the right atrium and right ventricle.  There is no focal airspace consolidation, pleural effusion or pneumothorax. There is no overt pulmonary edema.  The osseous structures are unremarkable.  IMPRESSION: 1. Interval dual lead pacer placement. 2. No pneumothorax. 3. Cardiomegaly.   Electronically Signed   By: Rosemarie Ax   On: 10/08/2014 14:51   Dg Chest Port 1 View  10/07/2014   CLINICAL DATA:  Loss of consciousness.  Hypertension  EXAM: PORTABLE CHEST - 1 VIEW  COMPARISON:  April 30, 2014  FINDINGS: Lungs are clear. Heart is borderline enlarged with pulmonary vascularity within normal limits. No adenopathy. No bone lesions.  IMPRESSION: Borderline cardiac enlargement.  No edema or consolidation.   Electronically Signed   By: Lowella Grip M.D.   On: 10/07/2014 18:14      FOLLOW UP PLANS AND APPOINTMENTS    Medication List    STOP taking these medications        metoprolol succinate 50 MG 24 hr tablet  Commonly known as:  TOPROL-XL      TAKE these medications        ACCU-CHEK ADVANTAGE DIABETES kit  Use as instructed     ACCU-CHEK FASTCLIX LANCETS Misc  Check blood sugar TID & QHS     acetaminophen 500 MG tablet  Commonly known as:  TYLENOL  Take 1,000 mg by mouth every 6 (six) hours as needed for moderate pain.     amiodarone 400 MG tablet  Commonly known as:  PACERONE  Take 1 tablet (400  mg total) by mouth 2 (two) times daily.     amLODipine 10 MG tablet  Commonly known as:  NORVASC  Take 1 tablet (10 mg total) by mouth daily.     apixaban 5 MG Tabs tablet  Commonly known as:  ELIQUIS  Take 1 tablet (5 mg total) by mouth 2 (two) times daily.     aspirin 81 MG chewable tablet  Chew 81 mg by mouth daily.     atorvastatin 10 MG tablet  Commonly known as:  LIPITOR  Take 1 tablet (10 mg total) by mouth daily.     glipiZIDE 5 MG tablet  Commonly known as:  GLUCOTROL  Take 1 tablet (5 mg total) by mouth 2 (two) times daily.     glucose blood test strip  Use as instructed     glucose  blood test strip  Commonly known as:  ACCU-CHEK AVIVA PLUS  Use as instructed     hydrALAZINE 100 MG tablet  Commonly known as:  APRESOLINE  Take 1 tablet (100 mg total) by mouth 3 (three) times daily.     metoprolol 100 MG tablet  Commonly known as:  LOPRESSOR  Take 0.5 tablets (50 mg total) by mouth 2 (two) times daily.     multivitamins ther. w/minerals Tabs tablet  Take 1 tablet by mouth daily. Centrum Silver     sitaGLIPtin 25 MG tablet  Commonly known as:  JANUVIA  Take 1 tablet (25 mg total) by mouth daily.       Follow-up Information    Follow up with Virl Axe, MD.   Specialty:  Cardiology   Why:  our office will call to arrange for device clinic follow up for wound check in 7-10 days. then 3 month follow up with Dr. Caryl Comes.    Contact information:   [address] [place] [address] [phone]       Follow up with Laverda Page, MD.   Specialty:  Cardiology   Why:  To be seen in 2 weeks   Contact information:   [address] [place] [address] [phone]        Laverda Page, MD 10/09/2014, 11:46 AM  Pager: [phone] Office: [phone] If no answer: [phone]

## 2014-10-10 NOTE — Care Management Note (Unsigned)
    Page 1 of 1   10/10/2014     7:33:25 AM CARE MANAGEMENT NOTE 10/10/2014  Patient:  Alexander [name], Alexander [name]   Account Number:  [id number]  Date Initiated:  10/09/2014  Documentation initiated by:  [facility]  Subjective/Objective Assessment:   adm: Syncope, cardiogenic, due to ventricle standstill.     Action/Plan:   discharge planning   Anticipated DC Date:  10/09/2014   Anticipated DC Plan:  [place]  CM consult  Medication Assistance      Choice offered to / List presented to:             Status of service:  Completed, signed off Medicare Important Message given?   (If response is "NO", the following Medicare IM given date fields will be blank) Date Medicare IM given:   Medicare IM given by:   Date Additional Medicare IM given:   Additional Medicare IM given by:    Discharge Disposition:  HOME/SELF CARE  Per UR Regulation:    If discussed at Long Length of Stay Meetings, dates discussed:    Comments:  10/09/14 09:00 CM received call from RN stating pt is seen at [facility] but needs 4 doses of prescriptions to last hime until he can get to the clinic to fill his prescriptions. CM contacted in house pharmacy, explained problem and pharmacy filled.  CM picked up the meds from pharmacy and gave them to pt along with an eliquis free 30 day trial card (he can fill at any pharmacy).  No other CM needs communicated.  Mariane Masters, BSN, CM [phone].

## 2014-10-11 ENCOUNTER — Other Ambulatory Visit: Payer: Self-pay

## 2014-10-11 DIAGNOSIS — I739 Peripheral vascular disease, unspecified: Secondary | ICD-10-CM

## 2014-10-11 MED ORDER — ATORVASTATIN CALCIUM 10 MG PO TABS: 10.0000 mg | ORAL_TABLET | Freq: Every day | ORAL | Status: DC

## 2014-10-13 ENCOUNTER — Other Ambulatory Visit: Payer: Self-pay

## 2014-10-13 DIAGNOSIS — I739 Peripheral vascular disease, unspecified: Secondary | ICD-10-CM

## 2014-10-13 MED ORDER — SITAGLIPTIN PHOSPHATE 25 MG PO TABS: 25.0000 mg | ORAL_TABLET | Freq: Every day | ORAL | Status: DC

## 2014-10-13 MED ORDER — ATORVASTATIN CALCIUM 10 MG PO TABS: 10.0000 mg | ORAL_TABLET | Freq: Every day | ORAL | Status: DC

## 2014-10-14 ENCOUNTER — Encounter (HOSPITAL_COMMUNITY)
Admission: RE | Admit: 2014-10-14 | Discharge: 2014-10-14 | Disposition: A | Discharge status: Home / Self Care | Payer: Medicaid Other | Source: Ambulatory Visit | Attending: Nephrology | Admitting: Nephrology

## 2014-10-14 DIAGNOSIS — N183 Chronic kidney disease, stage 3 (moderate): Secondary | ICD-10-CM | POA: Insufficient documentation

## 2014-10-14 DIAGNOSIS — D631 Anemia in chronic kidney disease: Secondary | ICD-10-CM | POA: Diagnosis not present

## 2014-10-14 LAB — IRON AND TIBC
Iron: 88 ug/dL (ref 42–135)
Saturation Ratios: 41 % (ref 20–55)
TIBC: 216 ug/dL (ref 215–435)
UIBC: 128 ug/dL (ref 125–400)

## 2014-10-14 LAB — FERRITIN: FERRITIN: 446 ng/mL — AB (ref 22–322)

## 2014-10-14 LAB — POCT HEMOGLOBIN-HEMACUE: HEMOGLOBIN: 11.6 g/dL — AB (ref 13.0–17.0)

## 2014-10-14 MED ORDER — DARBEPOETIN ALFA 60 MCG/0.3ML IJ SOSY
PREFILLED_SYRINGE | INTRAMUSCULAR | Status: AC
  Filled 2014-10-14: qty 0.3

## 2014-10-14 MED ORDER — DARBEPOETIN ALFA 60 MCG/0.3ML IJ SOSY
60.0000 ug | PREFILLED_SYRINGE | INTRAMUSCULAR | Status: DC
  Administered 2014-10-14: 60 ug via SUBCUTANEOUS

## 2014-10-18 ENCOUNTER — Ambulatory Visit (INDEPENDENT_AMBULATORY_CARE_PROVIDER_SITE_OTHER): Payer: Medicaid Other | Admitting: *Deleted

## 2014-10-18 ENCOUNTER — Encounter: Payer: Self-pay | Admitting: Internal Medicine

## 2014-10-18 DIAGNOSIS — I469 Cardiac arrest, cause unspecified: Secondary | ICD-10-CM

## 2014-10-18 NOTE — Progress Notes (Signed)
Wound check-PPM 

## 2014-10-21 ENCOUNTER — Encounter (HOSPITAL_COMMUNITY): Payer: Self-pay | Admitting: Cardiology

## 2014-10-21 ENCOUNTER — Encounter (HOSPITAL_COMMUNITY)
Admission: RE | Admit: 2014-10-21 | Discharge: 2014-10-21 | Disposition: A | Discharge status: Home / Self Care | Payer: Medicaid Other | Source: Ambulatory Visit | Attending: Nephrology | Admitting: Nephrology

## 2014-10-21 DIAGNOSIS — D631 Anemia in chronic kidney disease: Secondary | ICD-10-CM | POA: Diagnosis not present

## 2014-10-21 LAB — MDC_IDC_ENUM_SESS_TYPE_INCLINIC
Battery Voltage: 3.12 V
Brady Statistic AP VP Percent: 0.1 %
Lead Channel Impedance Value: 513 Ohm
Lead Channel Pacing Threshold Amplitude: 1 V
Lead Channel Pacing Threshold Pulse Width: 0.4 ms
Lead Channel Sensing Intrinsic Amplitude: 3.4 mV
Lead Channel Sensing Intrinsic Amplitude: 5.9 mV
Lead Channel Setting Pacing Amplitude: 3.5 V
Lead Channel Setting Pacing Pulse Width: 0.4 ms
Lead Channel Setting Sensing Sensitivity: 2.8 mV
MDC IDC MSMT LEADCHNL RA IMPEDANCE VALUE: 399 Ohm
MDC IDC MSMT LEADCHNL RA PACING THRESHOLD AMPLITUDE: 1 V
MDC IDC MSMT LEADCHNL RA PACING THRESHOLD PULSEWIDTH: 0.4 ms
MDC IDC SET LEADCHNL RA PACING AMPLITUDE: 3.5 V
MDC IDC SET ZONE DETECTION INTERVAL: 350 ms
MDC IDC SET ZONE DETECTION INTERVAL: 400 ms
MDC IDC STAT BRADY AP VS PERCENT: 98.1 %
MDC IDC STAT BRADY AS VP PERCENT: 0.1 %
MDC IDC STAT BRADY AS VS PERCENT: 1.6 %

## 2014-10-21 LAB — POCT HEMOGLOBIN-HEMACUE: Hemoglobin: 12 g/dL — ABNORMAL LOW (ref 13.0–17.0)

## 2014-10-21 MED ORDER — DARBEPOETIN ALFA 60 MCG/0.3ML IJ SOSY: 60.0000 ug | PREFILLED_SYRINGE | INTRAMUSCULAR | Status: DC

## 2014-11-03 ENCOUNTER — Encounter (HOSPITAL_COMMUNITY)
Admission: RE | Admit: 2014-11-03 | Discharge: 2014-11-03 | Disposition: A | Discharge status: Home / Self Care | Payer: Medicaid Other | Source: Ambulatory Visit | Attending: Nephrology | Admitting: Nephrology

## 2014-11-03 DIAGNOSIS — N183 Chronic kidney disease, stage 3 (moderate): Secondary | ICD-10-CM | POA: Diagnosis not present

## 2014-11-03 DIAGNOSIS — D631 Anemia in chronic kidney disease: Secondary | ICD-10-CM | POA: Insufficient documentation

## 2014-11-03 LAB — POCT HEMOGLOBIN-HEMACUE: HEMOGLOBIN: 12.2 g/dL — AB (ref 13.0–17.0)

## 2014-11-03 MED ORDER — DARBEPOETIN ALFA 60 MCG/0.3ML IJ SOSY
60.0000 ug | PREFILLED_SYRINGE | INTRAMUSCULAR | Status: DC
Start: 2014-11-03 — End: 2014-11-04

## 2014-11-09 ENCOUNTER — Ambulatory Visit (HOSPITAL_COMMUNITY)
Admission: RE | Admit: 2014-11-09 | Discharge: 2014-11-10 | Disposition: A | Discharge status: Home / Self Care | Payer: Medicaid Other | Source: Ambulatory Visit | Attending: Cardiology | Admitting: Cardiology

## 2014-11-09 ENCOUNTER — Encounter (HOSPITAL_COMMUNITY)
Admission: RE | Disposition: A | Discharge status: Home / Self Care | Payer: Medicaid Other | Source: Ambulatory Visit | Attending: Cardiology

## 2014-11-09 ENCOUNTER — Other Ambulatory Visit: Payer: Self-pay | Admitting: Pharmacist

## 2014-11-09 ENCOUNTER — Encounter (HOSPITAL_COMMUNITY): Payer: Self-pay | Admitting: General Practice

## 2014-11-09 DIAGNOSIS — E785 Hyperlipidemia, unspecified: Secondary | ICD-10-CM | POA: Diagnosis not present

## 2014-11-09 DIAGNOSIS — R55 Syncope and collapse: Secondary | ICD-10-CM | POA: Insufficient documentation

## 2014-11-09 DIAGNOSIS — I2510 Atherosclerotic heart disease of native coronary artery without angina pectoris: Secondary | ICD-10-CM | POA: Diagnosis not present

## 2014-11-09 DIAGNOSIS — E1165 Type 2 diabetes mellitus with hyperglycemia: Secondary | ICD-10-CM | POA: Diagnosis not present

## 2014-11-09 DIAGNOSIS — I4510 Unspecified right bundle-branch block: Secondary | ICD-10-CM | POA: Diagnosis not present

## 2014-11-09 DIAGNOSIS — I471 Supraventricular tachycardia: Secondary | ICD-10-CM | POA: Insufficient documentation

## 2014-11-09 DIAGNOSIS — Z7901 Long term (current) use of anticoagulants: Secondary | ICD-10-CM | POA: Diagnosis not present

## 2014-11-09 DIAGNOSIS — I495 Sick sinus syndrome: Secondary | ICD-10-CM | POA: Insufficient documentation

## 2014-11-09 DIAGNOSIS — I129 Hypertensive chronic kidney disease with stage 1 through stage 4 chronic kidney disease, or unspecified chronic kidney disease: Secondary | ICD-10-CM | POA: Insufficient documentation

## 2014-11-09 DIAGNOSIS — Z950 Presence of cardiac pacemaker: Secondary | ICD-10-CM | POA: Insufficient documentation

## 2014-11-09 DIAGNOSIS — N183 Chronic kidney disease, stage 3 (moderate): Secondary | ICD-10-CM | POA: Insufficient documentation

## 2014-11-09 DIAGNOSIS — I480 Paroxysmal atrial fibrillation: Secondary | ICD-10-CM | POA: Diagnosis not present

## 2014-11-09 DIAGNOSIS — Z955 Presence of coronary angioplasty implant and graft: Secondary | ICD-10-CM

## 2014-11-09 DIAGNOSIS — Z87891 Personal history of nicotine dependence: Secondary | ICD-10-CM | POA: Diagnosis not present

## 2014-11-09 DIAGNOSIS — Z9861 Coronary angioplasty status: Secondary | ICD-10-CM

## 2014-11-09 HISTORY — PX: PERCUTANEOUS CORONARY STENT INTERVENTION (PCI-S): SHX6016

## 2014-11-09 HISTORY — PX: LEFT HEART CATHETERIZATION WITH CORONARY ANGIOGRAM: SHX5451

## 2014-11-09 HISTORY — DX: Presence of cardiac pacemaker: Z95.0

## 2014-11-09 HISTORY — DX: Atherosclerotic heart disease of native coronary artery without angina pectoris: I25.10

## 2014-11-09 LAB — POCT ACTIVATED CLOTTING TIME
ACTIVATED CLOTTING TIME: 251 s
ACTIVATED CLOTTING TIME: 509 s

## 2014-11-09 LAB — GLUCOSE, CAPILLARY
GLUCOSE-CAPILLARY: 177 mg/dL — AB (ref 70–99)
Glucose-Capillary: 159 mg/dL — ABNORMAL HIGH (ref 70–99)
Glucose-Capillary: 223 mg/dL — ABNORMAL HIGH (ref 70–99)
Glucose-Capillary: 169 mg/dL — ABNORMAL HIGH (ref 70–99)
Glucose-Capillary: 241 mg/dL — ABNORMAL HIGH (ref 70–99)

## 2014-11-09 SURGERY — LEFT HEART CATHETERIZATION WITH CORONARY ANGIOGRAM
Anesthesia: LOCAL

## 2014-11-09 MED ORDER — FENTANYL CITRATE 0.05 MG/ML IJ SOLN
INTRAMUSCULAR | Status: AC
  Filled 2014-11-09: qty 2

## 2014-11-09 MED ORDER — APIXABAN 5 MG PO TABS
5.0000 mg | ORAL_TABLET | Freq: Two times a day (BID) | ORAL | Status: DC
  Administered 2014-11-09 – 2014-11-10 (×2): 5 mg via ORAL
  Filled 2014-11-09 (×3): qty 1

## 2014-11-09 MED ORDER — METOPROLOL TARTRATE 100 MG PO TABS
100.0000 mg | ORAL_TABLET | Freq: Two times a day (BID) | ORAL | Status: DC
  Administered 2014-11-09 – 2014-11-10 (×2): 100 mg via ORAL
  Filled 2014-11-09 (×3): qty 1

## 2014-11-09 MED ORDER — NITROGLYCERIN 1 MG/10 ML FOR IR/CATH LAB
INTRA_ARTERIAL | Status: AC
  Filled 2014-11-09: qty 10

## 2014-11-09 MED ORDER — LIDOCAINE HCL (PF) 1 % IJ SOLN
INTRAMUSCULAR | Status: AC
  Filled 2014-11-09: qty 30

## 2014-11-09 MED ORDER — VERAPAMIL HCL 2.5 MG/ML IV SOLN
INTRAVENOUS | Status: AC
  Filled 2014-11-09: qty 2

## 2014-11-09 MED ORDER — NITROGLYCERIN 1 MG/10 ML FOR IR/CATH LAB
INTRA_ARTERIAL | Status: AC
  Filled 2014-11-09: qty 20

## 2014-11-09 MED ORDER — MIDAZOLAM HCL 2 MG/2ML IJ SOLN
INTRAMUSCULAR | Status: AC
  Filled 2014-11-09: qty 2

## 2014-11-09 MED ORDER — CLOPIDOGREL BISULFATE 300 MG PO TABS
ORAL_TABLET | ORAL | Status: AC
  Filled 2014-11-09: qty 2

## 2014-11-09 MED ORDER — INSULIN ASPART 100 UNIT/ML ~~LOC~~ SOLN
0.0000 [IU] | Freq: Every day | SUBCUTANEOUS | Status: DC
  Administered 2014-11-09: 22:00:00 2 [IU] via SUBCUTANEOUS

## 2014-11-09 MED ORDER — HEPARIN (PORCINE) IN NACL 2-0.9 UNIT/ML-% IJ SOLN
INTRAMUSCULAR | Status: AC
  Filled 2014-11-09: qty 1000

## 2014-11-09 MED ORDER — ASPIRIN 81 MG PO CHEW
81.0000 mg | CHEWABLE_TABLET | Freq: Every day | ORAL | Status: DC
  Administered 2014-11-10: 10:00:00 81 mg via ORAL
  Filled 2014-11-09 (×2): qty 1

## 2014-11-09 MED ORDER — SODIUM CHLORIDE 0.9 % IV BOLUS (SEPSIS)
500.0000 mL | Freq: Once | INTRAVENOUS | Status: AC
  Administered 2014-11-09: 500 mL via INTRAVENOUS

## 2014-11-09 MED ORDER — FAMOTIDINE IN NACL 20-0.9 MG/50ML-% IV SOLN
INTRAVENOUS | Status: AC
  Filled 2014-11-09: qty 50

## 2014-11-09 MED ORDER — HYDRALAZINE HCL 50 MG PO TABS
100.0000 mg | ORAL_TABLET | Freq: Three times a day (TID) | ORAL | Status: DC
  Administered 2014-11-09 – 2014-11-10 (×3): 100 mg via ORAL
  Filled 2014-11-09 (×5): qty 2

## 2014-11-09 MED ORDER — INSULIN ASPART 100 UNIT/ML ~~LOC~~ SOLN
0.0000 [IU] | Freq: Three times a day (TID) | SUBCUTANEOUS | Status: DC
  Administered 2014-11-10: 2 [IU] via SUBCUTANEOUS

## 2014-11-09 MED ORDER — HEPARIN SODIUM (PORCINE) 1000 UNIT/ML IJ SOLN
INTRAMUSCULAR | Status: AC
  Filled 2014-11-09: qty 1

## 2014-11-09 MED ORDER — GLIPIZIDE 5 MG PO TABS
5.0000 mg | ORAL_TABLET | Freq: Two times a day (BID) | ORAL | Status: DC
  Administered 2014-11-09 – 2014-11-10 (×2): 5 mg via ORAL
  Filled 2014-11-09 (×4): qty 1

## 2014-11-09 MED ORDER — SODIUM CHLORIDE 0.9 % IV SOLN: 250.0000 mL | INTRAVENOUS | Status: DC | PRN

## 2014-11-09 MED ORDER — ASPIRIN 81 MG PO CHEW
CHEWABLE_TABLET | ORAL | Status: AC
  Administered 2014-11-09: 81 mg
  Filled 2014-11-09: qty 1

## 2014-11-09 MED ORDER — AMLODIPINE BESYLATE 10 MG PO TABS
10.0000 mg | ORAL_TABLET | Freq: Every day | ORAL | Status: DC
  Administered 2014-11-10: 10 mg via ORAL
  Filled 2014-11-09: qty 1

## 2014-11-09 MED ORDER — BIVALIRUDIN 250 MG IV SOLR
INTRAVENOUS | Status: AC
  Filled 2014-11-09: qty 250

## 2014-11-09 MED ORDER — ASPIRIN 81 MG PO CHEW: 81.0000 mg | CHEWABLE_TABLET | ORAL | Status: DC

## 2014-11-09 MED ORDER — LINAGLIPTIN 5 MG PO TABS
5.0000 mg | ORAL_TABLET | Freq: Every day | ORAL | Status: DC
  Administered 2014-11-09 – 2014-11-10 (×2): 5 mg via ORAL
  Filled 2014-11-09 (×3): qty 1

## 2014-11-09 MED ORDER — ACETAMINOPHEN 325 MG PO TABS: 650.0000 mg | ORAL_TABLET | ORAL | Status: DC | PRN

## 2014-11-09 MED ORDER — SODIUM CHLORIDE 0.9 % IJ SOLN: 3.0000 mL | Freq: Two times a day (BID) | INTRAMUSCULAR | Status: DC

## 2014-11-09 MED ORDER — VERAPAMIL HCL 2.5 MG/ML IV SOLN
INTRAVENOUS | Status: AC
  Filled 2014-11-09: qty 4

## 2014-11-09 MED ORDER — SODIUM CHLORIDE 0.9 % IV SOLN: INTRAVENOUS | Status: DC

## 2014-11-09 MED ORDER — AMIODARONE HCL 200 MG PO TABS: 200.0000 mg | ORAL_TABLET | Freq: Every day | ORAL | Status: DC

## 2014-11-09 MED ORDER — SODIUM CHLORIDE 0.9 % IJ SOLN
3.0000 mL | INTRAMUSCULAR | Status: DC | PRN
  Administered 2014-11-09: 3 mL via INTRAVENOUS
  Filled 2014-11-09: qty 3

## 2014-11-09 MED ORDER — HYDRALAZINE HCL 100 MG PO TABS: 100.0000 mg | ORAL_TABLET | Freq: Three times a day (TID) | ORAL | Status: DC

## 2014-11-09 MED ORDER — ATORVASTATIN CALCIUM 10 MG PO TABS
10.0000 mg | ORAL_TABLET | Freq: Every day | ORAL | Status: DC
  Administered 2014-11-10: 10 mg via ORAL
  Filled 2014-11-09: qty 1

## 2014-11-09 MED ORDER — CLOPIDOGREL BISULFATE 75 MG PO TABS
75.0000 mg | ORAL_TABLET | Freq: Every day | ORAL | Status: DC
  Administered 2014-11-10: 08:00:00 75 mg via ORAL
  Filled 2014-11-09: qty 1

## 2014-11-09 MED ORDER — ONDANSETRON HCL 4 MG/2ML IJ SOLN
4.0000 mg | Freq: Four times a day (QID) | INTRAMUSCULAR | Status: DC | PRN
Start: 2014-11-09 — End: 2014-11-10

## 2014-11-09 MED ORDER — AMIODARONE HCL 200 MG PO TABS
200.0000 mg | ORAL_TABLET | Freq: Every day | ORAL | Status: DC
  Administered 2014-11-10: 10:00:00 200 mg via ORAL
  Filled 2014-11-09: qty 1

## 2014-11-09 NOTE — H&P (Signed)
  Please see office visit notes for complete details of HPI. Scheduled for Coronary angio and PCI to LAD and Cx

## 2014-11-09 NOTE — Interval H&P Note (Signed)
History and Physical Interval Note:  11/09/2014 7:38 AM  Alexander [name]  has presented today for surgery, with the diagnosis of snycope  The various methods of treatment have been discussed with the patient and family. After consideration of risks, benefits and other options for treatment, the patient has consented to  Procedure(s): LEFT HEART CATHETERIZATION WITH CORONARY ANGIOGRAM (N/A) and possible PCI as a surgical intervention .  The patient's history has been reviewed, patient examined, no change in status, stable for surgery.  I have reviewed the patient's chart and labs.  Questions were answered to the patient's satisfaction.   Cath Lab Visit (complete for each Cath Lab visit)  Clinical Evaluation Leading to the Procedure:   ACS: No.  Non-ACS:    Anginal Classification: CCS III. Patient presenting with syncope and heart block and coronary angio 3 weeks ago revealing severe 3 vessel CAD.  Anti-ischemic medical therapy: Maximal Therapy (2 or more classes of medications)  Non-Invasive Test Results: No non-invasive testing performed  Prior CABG: No previous CABG   Patient Information: Ischemic Symptoms? CCS III (Marked limitation of ordinary activity)  Anti-ischemic Medical Therapy? Maximal Medical Therapy (2 or more classes of medications)  Non-invasive Test Results? No non-invasive testing performed  Prior CABG? No Previous CABG  Patient Information:  1-2V CAD, no prox LAD A (7) Indication: 20; Score 7 1180 Patient Information:  1-2V-CAD with DS 50-60%  With No FFR, No IVUS I (3) Indication: 21; Score 3 1183 Patient Information:  1-2V-CAD with DS 50-60%  With FFR<=0.8, and/or IVUS signficant reduction in CSA A (7) Indication: 22; Score 7 1184 Patient Information:  1-2V-CAD with DS 50-60%  With FFR>0.8, IVUS not significant I (2) Indication: 23; Score 2 1185 Patient Information:  3V-CAD without LMCA  With Abnormal LV systolic function A (9) Indication:  48; Score 9 1186 Patient Information:  LMCA-CAD A (9) Indication: 49; Score 9 1191 Patient Information:  2V-CAD with prox LAD  PCI A (7) Indication: 62; Score 7 1182 Patient Information:  2V-CAD with prox LAD  CABG A (8) Indication: 62; Score 8 1181 Patient Information:  3V-CAD without LMCA  With Low CAD burden(i.e., 3 focal stenoses, low SYNTAX score)  PCI A (7) Indication: 63; Score 7 1188 Patient Information:  3V-CAD without LMCA  With Low CAD burden(i.e., 3 focal stenoses, low SYNTAX score)  CABG A (9) Indication: 63; Score 9 1187 Patient Information:  3V-CAD without LMCA  E06c - Intermediate-high CAD burden (i.e., multiple diffuse lesions, presence of CTO, or high SYNTAX score)  PCI U (4) Indication: 64; Score 4 1190 Patient Information:  3V-CAD without LMCA  E06c - Intermediate-high CAD burden (i.e., multiple diffuse lesions, presence of CTO, or high SYNTAX score)  CABG A (9) Indication: 64; Score 9 1189 Patient Information:  LMCA-CAD  With Isolated LMCA stenosis  PCI U (6) Indication: 65; Score 6 1193 Patient Information:  LMCA-CAD  With Isolated LMCA stenosis  CABG A (9) Indication: 65; Score 9 1192 Patient Information:  LMCA-CAD  Additional CAD, low CAD burden (i.e., 1- to 2-vessel additional involvement, low SYNTAX score)  PCI U (5) Indication: 66; Score 5 1195 Patient Information:  LMCA-CAD  Additional CAD, low CAD burden (i.e., 1- to 2-vessel additional involvement, low SYNTAX score)  CABG A (9) Indication: 66; Score 9 1194 Patient Information:  LMCA-CAD  Additional CAD, intermediate-high CAD burden (i.e., 3-vessel involvement, presence of CTO, or high SYNTAX score)  PCI I (3) Indication: 67; Score 3 1197 Patient Information:  LMCA-CAD  Additional CAD, intermediate-high CAD burden (i.e., 3-vessel involvement, presence of CTO, or high SYNTAX score)  CABG A (9) Indication: 67; Score  9       Jaeleigh Monaco,JAGADEESH R

## 2014-11-09 NOTE — CV Procedure (Addendum)
Procedure performed:  Left coronary arteriography.  Diamondback CSI coronary atherectomy and stenting of the mid LAD with implantation of a 3.0 x 28 mm Promus Premier DES following. Stenting of the distal dominant circumflex coronary artery following diamondback atherectomy and implantation of a 2.5 x 16 mm Promus Premier DES for high-grade 90% stenosis.  Indication: Patient is a 53 year-old Caucasian male with history of hypertension,  hyperlipidemia,  Diabetes Mellitus,   peripheral arterial disease, who presents with syncope on 10/07/2014. Patient was found to be in heart block and also paroxysmal episodes of atrial fibrillation, Coronary angiography at that time it revealed high-grade severely calcified circumferential calcification stenosis in the mid LAD of 90-95% and distal circumflex coronary artery of 90%. Circumflex coronary artery is codominant with right coronary artery. He also has RCA stenosis, the lesions were felt to be amenable for coronary angioplasty. Hence patient was scheduled for elective angioplasty after he had undergone permanent pacemaker insertion with a MRI compatible Medtronic dual chamber pacemaker. Although patient asymptomatic, his syncope was felt to be due to underlying sick sinus syndrome and also CAD and due to the fact that he had high-grade stenosis it was felt that we should proceed with angioplasty.   Coronary angiogram on 10/07/2014 had revealed the following:  Left ventricle: Performed in the RAO projection revealed LVEF of 60%. No gross wall motion abnormality noted. 5 mL of contrast was utilized. Hand injection.  Right coronary artery: A small vessel, codominant with the circumflex coronary artery. Very small PDA branches arise from the distal bed. Proximal RCA has a 80% stenosis at the crux followed by tandem high-grade 90% stenosis to 95% stenosis in the mid segment proximal to the origin of the RV branch. It is at most a 2.5 mm vessel in the proximal  end.  Left main coronary artery is large and normal.   Circumflex coronary artery: A large vessel giving origin to a large obtuse marginal 1. This is also calcified. Distal segment gives origin to PDA and PL branches. Distal circumflex coronary artery prior to the origin of these branches has a high-grade 80% stenosis  LAD: LAD gives origin to a large diagonal 1. Just at the origin of D1, there is a high-grade 90% stenosis in LAD followed by a long segment diffuse 70-80% calcified eccentric stenosis in the midsegment.   Interventional data: Successful Diamondback CSI coronary atherectomy and stenting of the mid LAD with implantation of a 3.0 x 28 mm Promus Premier DES following. Stenting of the distal dominant circumflex coronary artery following diamondback atherectomy and implantation of a 2.5 x 16 mm Promus Premier DES for high-grade 90% stenosis.  Will need antiplatelet therapy with Plavix and Eliquis 5 mg by mouth twice a day will be continued.   Today we brought him for elective intervention.   Technique of intervention: 11/09/2014  Using a 6 French right radial arterial access,  Using a 6 [place] XB 3.5 guide catheter the left main  coronary  was selected and cannulated. Using Angiomax for anticoagulation, I utilized a  Viper 0.012"x290 cm guidewire and across the LAD coronary artery without any difficulty. I placed the tip of the wire into the distal  coronary artery. Angiography was performed. Then I utilized a 1.25 mm crown and multiple passes were made throughout the mid LAD both at low and high-speed, 8000 and 8200 RPMs. Following this angiography revealed excellent results with brisk TIMI-3 flow. I then implanted a 3.0 x 28 mm Promus Premier DES at 12  atmospheric pressure for 50 seconds followed by post dilatation with a 3.25 x 15 mm [place] emerge balloon at 18 atmospheric pressure for 70 seconds at the tightest stenotic segment and 30 seconds in the proximal segment. Following this  intracoronary nitroglycerin was administered and angiography was performed. Excellent result was evident without any edge dissection. The guidewire was withdrawn, angiography repeated.  Attention was then directed towards the circumflex coronary artery. Using the previously used noncompliant balloon as backup support, I was able to redirect the Viper wire into the circumflex coronary artery and placed the distal tip into the distal segment of the PDA. I then utilized the same atherectomy crown and multiple passes were made at 80,000 rpm's 2. This was followed by an plantation of a 2.5 x 16 mm Promus Premier DES followed by post dilatation with a 2.75 x 12 mm [place] emerge balloon at 16 atmospheric pressure at the distal end of the stent and 20 atmospheric pressure at the proximal end of the stent for 60 and 40 seconds respectively. I had difficulty advancing the noncompliant balloon at the proximal end of the stent strut, hence I utilized a 0.014" x 1 90 cm wiggle wire. The previously used wiper wire was pulled out of the body. After balloon angioplasty, intracoronary nitroglycerin was administered and angiography was performed. Excellent brisk flow was evident without any edge dissection. The procedure was successful. Guidewire was withdrawn out of the body, guide catheter disengaged and pulled out of the body. A total of 175 mL of contrast was utilized for interventional procedure. Patient tolerated the pressure well. There was no immediate competitions. Hemostasis was obtained by applying TR band.   Disposition: Patient will be discharged in morning  unless complications with out-patient follow up.

## 2014-11-09 NOTE — Care Management Note (Signed)
    Page 1 of 1   11/09/2014     4:58:12 PM CARE MANAGEMENT NOTE 11/09/2014  Patient:  Alexander [name], Alexander [name]   Account Number:  [id number]  Date Initiated:  11/09/2014  Documentation initiated by:  Mariann Laster  Subjective/Objective Assessment:   s/p PTCA     Action/Plan:   CM to follow for disposition needs   Anticipated DC Date:  11/10/2014   Anticipated DC Plan:  HOME/SELF CARE  In-house referral  NA      DC Planning Services  CM consult  Medication Assistance      Choice offered to / List presented to:             Status of service:  Completed, signed off Medicare Important Message given?  NO (If response is "NO", the following Medicare IM given date fields will be blank) Date Medicare IM given:   Medicare IM given by:   Date Additional Medicare IM given:   Additional Medicare IM given by:    Discharge Disposition:  HOME/SELF CARE  Per UR Regulation:  Reviewed for med. necessity/level of care/duration of stay  If discussed at [place] of Stay Meetings, dates discussed:    Comments:  Damire Remedios RN, BSN, MSHL, CCM  Nurse - Case Manager,  (Unit [phone]  11/09/2014 apixaban (ELIQUIS) tablet 5 mg 2 times / day Benefits Check in progress

## 2014-11-10 ENCOUNTER — Other Ambulatory Visit: Payer: Self-pay | Admitting: Internal Medicine

## 2014-11-10 DIAGNOSIS — Z950 Presence of cardiac pacemaker: Secondary | ICD-10-CM | POA: Diagnosis not present

## 2014-11-10 DIAGNOSIS — I495 Sick sinus syndrome: Secondary | ICD-10-CM | POA: Diagnosis not present

## 2014-11-10 DIAGNOSIS — I480 Paroxysmal atrial fibrillation: Secondary | ICD-10-CM | POA: Diagnosis not present

## 2014-11-10 DIAGNOSIS — I2510 Atherosclerotic heart disease of native coronary artery without angina pectoris: Secondary | ICD-10-CM | POA: Diagnosis not present

## 2014-11-10 LAB — BASIC METABOLIC PANEL
Anion gap: 4 — ABNORMAL LOW (ref 5–15)
BUN: 26 mg/dL — ABNORMAL HIGH (ref 6–23)
CO2: 23 mmol/L (ref 19–32)
Calcium: 8.3 mg/dL — ABNORMAL LOW (ref 8.4–10.5)
Chloride: 111 mEq/L (ref 96–112)
Creatinine, Ser: 1.74 mg/dL — ABNORMAL HIGH (ref 0.50–1.35)
GFR calc Af Amer: 49 mL/min — ABNORMAL LOW (ref >90)
GFR calc non Af Amer: 42 mL/min — ABNORMAL LOW (ref >90)
Glucose, Bld: 161 mg/dL — ABNORMAL HIGH (ref 70–99)
POTASSIUM: 4.5 mmol/L (ref 3.5–5.1)
SODIUM: 138 mmol/L (ref 135–145)

## 2014-11-10 LAB — GLUCOSE, CAPILLARY: Glucose-Capillary: 149 mg/dL — ABNORMAL HIGH (ref 70–99)

## 2014-11-10 LAB — CBC
HCT: 29.7 % — ABNORMAL LOW (ref 39.0–52.0)
Hemoglobin: 9.9 g/dL — ABNORMAL LOW (ref 13.0–17.0)
MCH: 29.4 pg (ref 26.0–34.0)
MCHC: 33.3 g/dL (ref 30.0–36.0)
MCV: 88.1 fL (ref 78.0–100.0)
PLATELETS: 145 10*3/uL — AB (ref 150–400)
RBC: 3.37 MIL/uL — AB (ref 4.22–5.81)
RDW: 12.6 % (ref 11.5–15.5)
WBC: 6.7 10*3/uL (ref 4.0–10.5)

## 2014-11-10 MED ORDER — FUROSEMIDE 40 MG PO TABS: 40.0000 mg | ORAL_TABLET | Freq: Every morning | ORAL | Status: DC

## 2014-11-10 MED ORDER — AMIODARONE HCL 400 MG PO TABS: 200.0000 mg | ORAL_TABLET | Freq: Every day | ORAL | Status: DC

## 2014-11-10 MED ORDER — CLOPIDOGREL BISULFATE 75 MG PO TABS: 75.0000 mg | ORAL_TABLET | Freq: Every day | ORAL | Status: DC

## 2014-11-10 MED FILL — Sodium Chloride IV Soln 0.9%: INTRAVENOUS | Qty: 50 | Status: AC

## 2014-11-10 NOTE — Discharge Summary (Signed)
Physician Discharge Summary  Patient ID: Alexander [name] MRN: [id number] DOB/AGE: Jul 29, 1961 53 y.o.  Admit date: 11/09/2014 Discharge date: 11/10/2014  Primary Discharge Diagnosis 1. Coronary artery disease of the native vessels S/P  PTCA 11/09/2014: Diamondback CSI coronary atherectomy and stenting of the mid LAD with implantation of a 3.0 x 28 mm Promus Premier DES following. Stenting of the distal dominant circumflex coronary artery following diamondback atherectomy and implantation of a 2.5 x 16 mm  2. Sick sinus syndrome, Paroxysmal atrial fibrillation, frequent episodes of ventricle standstill on the day of admission leading to syncope S/P Permanent MRI compatible Medtronic dual chamber pacemaker implantation on 10/08/2014. 3. Atrial tachycardia/Paroxysmal A.fibrillation with RVR on long term anticoagulation. CHA2DS2-VASCScore: Risk Score  3,  Yearly risk of stroke  3.2. Recommendation: ASA no/Anticoagulation yes  [place] Has Bled: Score 2 (due to plavix). Estimated bleeding risk: 1.8-3.2% estimated risk of major bleeding at 1 year with [place].  4. Diabetes mellitus type 2 uncontrolled 5. Peripheral arterial disease with history of left femoropopliteal bypass surgery in the past. Patient follows Dr. Shanon Rosser. 6. Tobacco use disorder, recently quit. 7. Long-term use of anticoagulants. 8. Chronic stage 3 kidney disease and diabetic nephropathy.  Significant Diagnostic Studies: 11/09/2014: Coronary intervention and placement of DES to LAD and Cx.  Hospital Course: Patient is a 53 year-old Caucasian male with history of hypertension, hyperlipidemia, Diabetes Mellitus, peripheral arterial disease, who presents with syncope on 10/07/2014. Patient was found to be in heart block and also paroxysmal episodes of atrial fibrillation with RVR.  Coronary angiography at that time it revealed high-grade severely calcified circumferential calcification stenosis in the mid LAD of 90-95% and distal  circumflex coronary artery of 90%. Circumflex coronary artery is codominant with right coronary artery. He also has RCA stenosis, high grade, but RCA was very small. The lesions were felt to be amenable for coronary angioplasty. Hence patient was scheduled for elective angioplasty after he had undergone permanent pacemaker insertion for recurrent syncope and heart block and ventricular standstill with a MRI compatible Medtronic dual chamber pacemaker on 10/08/2014.   Although patient asymptomatic, his syncope was felt to be due to underlying sick sinus syndrome and also CAD and due to the fact that he had high-grade stenosis it was felt that we should proceed with angioplasty due to high grade 3 vessel CAD.patient underwent successful  Revascularization of the LAD and dominant distal circumflex coronary artery.  The following morning he was stable for discharge.  His serum creatinine was minimally elevated, however still remained in stage III chronic kidney disease range and hence felt stable for discharge.   Recommendations on discharge: patient will be started on Plavix, patient is on Eliquis for paroxysmal atrial fibrillation and he'll be continued on dual agents for at least a period of 3 months if not for one year.  Patient is aware of the bleeding risk.  Discharge Exam: Blood pressure 153/61, pulse 68, temperature 98.3 F (36.8 C), temperature source Oral, resp. rate 18, height 6' (1.829 m), weight 112.03 kg (246 lb 15.7 oz), SpO2 98 %.   General appearance: alert, cooperative, appears older than stated age, no distress and moderately obese Resp: clear to auscultation bilaterally Chest wall: left sided chest wall tenderness, pacemaker insertion site Cardio: regular rate and rhythm, S1, S2 normal, no murmur, click, rub or gallop Extremities: left Sim's amputation noted. No edema. Full range of movements in all 4 extremities Pulses: Bilateral femoral bruit present. Very distant pulses. Popliteal  could not be  felt bilaterally. Dorsalis pedis on the right and left is 1+ PT absent bilaterally. Labs:   Lab Results  Component Value Date   WBC 6.7 11/10/2014   HGB 9.9* 11/10/2014   HCT 29.7* 11/10/2014   MCV 88.1 11/10/2014   PLT 145* 11/10/2014    Recent Labs Lab 11/10/14 0452  NA 138  K 4.5  CL 111  CO2 23  BUN 26*  CREATININE 1.74*  CALCIUM 8.3*  GLUCOSE 161*   Lab Results  Component Value Date   TROPONINI <0.30 10/08/2014    Lipid Panel     Component Value Date/Time   CHOL 147 11/20/2013 1422   TRIG 160* 11/20/2013 1422   HDL 30* 11/20/2013 1422   CHOLHDL 4.9 11/20/2013 1422   VLDL 32 11/20/2013 1422   LDLCALC 85 11/20/2013 1422    EKG: A paced rhythm, poor R-wave progression, left axis deviation, right bundle branch block.  Pulmonary disease pattern.  No change from prior EKG.  Echocardiogram 10/08/2014: Left ventricle: The cavity size was normal. There was severe concentric hypertrophy. Systolic function was normal. Wall motionwas normal; there were no regional wall motion abnormalities. Features are consistent with a pseudonormal left ventricular filling pattern, with concomitant abnormal relaxation and increased filling pressure (grade 2 diastolic dysfunction). Doppler parameters are consistent with both elevated ventricular end-diastolic filling pressure and elevated left atrial filling pressure.- Left atrium: The atrium was mildly to moderately dilated.   Radiology: No results found.    FOLLOW UP PLANS AND APPOINTMENTS    Medication List    STOP taking these medications        aspirin 81 MG chewable tablet      TAKE these medications        ACCU-CHEK ADVANTAGE DIABETES kit  Use as instructed     ACCU-CHEK FASTCLIX LANCETS Misc  Check blood sugar TID & QHS     amiodarone 400 MG tablet  Commonly known as:  PACERONE  Take 0.5 tablets (200 mg total) by mouth daily.     amLODipine 10 MG tablet  Commonly known as:  NORVASC   Take 1 tablet (10 mg total) by mouth daily.     apixaban 5 MG Tabs tablet  Commonly known as:  ELIQUIS  Take 1 tablet (5 mg total) by mouth 2 (two) times daily.     atorvastatin 10 MG tablet  Commonly known as:  LIPITOR  Take 1 tablet (10 mg total) by mouth daily.     clopidogrel 75 MG tablet  Commonly known as:  PLAVIX  Take 1 tablet (75 mg total) by mouth daily with breakfast.     furosemide 40 MG tablet  Commonly known as:  LASIX  Take 1 tablet (40 mg total) by mouth every morning.     glipiZIDE 5 MG tablet  Commonly known as:  GLUCOTROL  Take 1 tablet (5 mg total) by mouth 2 (two) times daily.     glucose blood test strip  Use as instructed     glucose blood test strip  Commonly known as:  ACCU-CHEK AVIVA PLUS  Use as instructed     hydrALAZINE 100 MG tablet  Commonly known as:  APRESOLINE  Take 1 tablet (100 mg total) by mouth 3 (three) times daily.     metoprolol 100 MG tablet  Commonly known as:  LOPRESSOR  Take 0.5 tablets (50 mg total) by mouth 2 (two) times daily.     multivitamins ther. w/minerals Tabs tablet  Take 1 tablet by  mouth daily. Centrum Silver     sitaGLIPtin 25 MG tablet  Commonly known as:  JANUVIA  Take 1 tablet (25 mg total) by mouth daily.           Follow-up Information    Follow up with Laverda Page, MD On 11/17/2014.   Specialty:  Cardiology   Contact information:   [address] [place] [address] [phone]        Laverda Page, MD 11/10/2014, 9:14 AM  Pager: [phone] Office: [phone] If no answer: [phone]

## 2014-11-10 NOTE — Progress Notes (Signed)
CARDIAC REHAB PHASE I   PRE:  Rate/Rhythm: 62 Pacing  BP:  Supine: 153/61  Sitting:   Standing:    SaO2:   MODE:  Ambulation: 460 ft   POST:  Rate/Rhythm: 101 Pacing  BP:  Supine:   Sitting: 162/67  Standing:    SaO2:  [id number] Assisted X 1 and used walker to ambulate. Gait steady with walker. Pt states that he has a cane and walker at home, but does not use them. He also admits that he does not walk much at home, just to [employer] and in grocery store some.No c/o with walking. Completed stent discharge education with pt. He voices understanding.Pt agrees to [facility]. [facility] in [place], will send referral. Pt quit smoking after hospitalization in November and has not smoking any. I congratulated him. We discussed smoking cessation. I gave him tips for quitting, coaching contact number and quit smart class information. Pt seems committed to making lifestyle changes.  Rodney Langton RN 11/10/2014 9:39 AM

## 2014-11-11 ENCOUNTER — Telehealth: Payer: Self-pay

## 2014-11-11 NOTE — Telephone Encounter (Signed)
Called [employer] to obtain prior authorization for [employer] for one year Approval # [id number] Per [employer]

## 2014-11-18 ENCOUNTER — Encounter (HOSPITAL_COMMUNITY)
Admission: RE | Admit: 2014-11-18 | Discharge: 2014-11-18 | Disposition: A | Discharge status: Home / Self Care | Payer: Medicaid Other | Source: Ambulatory Visit | Attending: Nephrology | Admitting: Nephrology

## 2014-11-18 DIAGNOSIS — N183 Chronic kidney disease, stage 3 (moderate): Secondary | ICD-10-CM | POA: Diagnosis not present

## 2014-11-18 DIAGNOSIS — D631 Anemia in chronic kidney disease: Secondary | ICD-10-CM | POA: Diagnosis not present

## 2014-11-18 LAB — FERRITIN: FERRITIN: 816 ng/mL — AB (ref 22–322)

## 2014-11-18 LAB — IRON AND TIBC
Iron: 120 ug/dL (ref 42–165)
Saturation Ratios: 50 % (ref 20–55)
TIBC: 240 ug/dL (ref 215–435)
UIBC: 120 ug/dL — ABNORMAL LOW (ref 125–400)

## 2014-11-18 LAB — POCT HEMOGLOBIN-HEMACUE: Hemoglobin: 11.4 g/dL — ABNORMAL LOW (ref 13.0–17.0)

## 2014-11-18 MED ORDER — DARBEPOETIN ALFA 60 MCG/0.3ML IJ SOSY
PREFILLED_SYRINGE | INTRAMUSCULAR | Status: AC
  Filled 2014-11-18: qty 0.3

## 2014-11-18 MED ORDER — DARBEPOETIN ALFA 60 MCG/0.3ML IJ SOSY
60.0000 ug | PREFILLED_SYRINGE | INTRAMUSCULAR | Status: DC
  Administered 2014-11-18: 60 ug via SUBCUTANEOUS

## 2014-11-25 ENCOUNTER — Encounter (HOSPITAL_COMMUNITY)
Admission: RE | Admit: 2014-11-25 | Discharge: 2014-11-25 | Disposition: A | Discharge status: Home / Self Care | Payer: Medicaid Other | Source: Ambulatory Visit | Attending: Nephrology | Admitting: Nephrology

## 2014-11-25 DIAGNOSIS — D631 Anemia in chronic kidney disease: Secondary | ICD-10-CM | POA: Diagnosis not present

## 2014-11-25 LAB — POCT HEMOGLOBIN-HEMACUE: Hemoglobin: 12.6 g/dL — ABNORMAL LOW (ref 13.0–17.0)

## 2014-11-25 MED ORDER — DARBEPOETIN ALFA 60 MCG/0.3ML IJ SOSY: 60.0000 ug | PREFILLED_SYRINGE | INTRAMUSCULAR | Status: DC

## 2014-12-07 ENCOUNTER — Other Ambulatory Visit: Payer: Self-pay | Admitting: Internal Medicine

## 2014-12-09 ENCOUNTER — Encounter (HOSPITAL_COMMUNITY)
Admission: RE | Admit: 2014-12-09 | Discharge: 2014-12-09 | Disposition: A | Discharge status: Home / Self Care | Payer: Medicaid Other | Source: Ambulatory Visit | Attending: Nephrology | Admitting: Nephrology

## 2014-12-09 DIAGNOSIS — D631 Anemia in chronic kidney disease: Secondary | ICD-10-CM | POA: Diagnosis not present

## 2014-12-09 MED ORDER — DARBEPOETIN ALFA 60 MCG/0.3ML IJ SOSY
PREFILLED_SYRINGE | INTRAMUSCULAR | Status: AC
  Administered 2014-12-09: 60 ug
  Filled 2014-12-09: qty 0.3

## 2014-12-09 MED ORDER — DARBEPOETIN ALFA 60 MCG/0.3ML IJ SOSY: 60.0000 ug | PREFILLED_SYRINGE | INTRAMUSCULAR | Status: DC

## 2014-12-10 LAB — POCT HEMOGLOBIN-HEMACUE: Hemoglobin: 11.2 g/dL — ABNORMAL LOW (ref 13.0–17.0)

## 2014-12-12 IMAGING — CR DG CHEST 2V
2 series · 2 of 2 positions shown · non-contrast
Comparison: 06/12/2012.

CLINICAL DATA: Open wound.  Pre hyperbaric chamber treatment.

CHEST - 2 VIEW

[w chest pa]
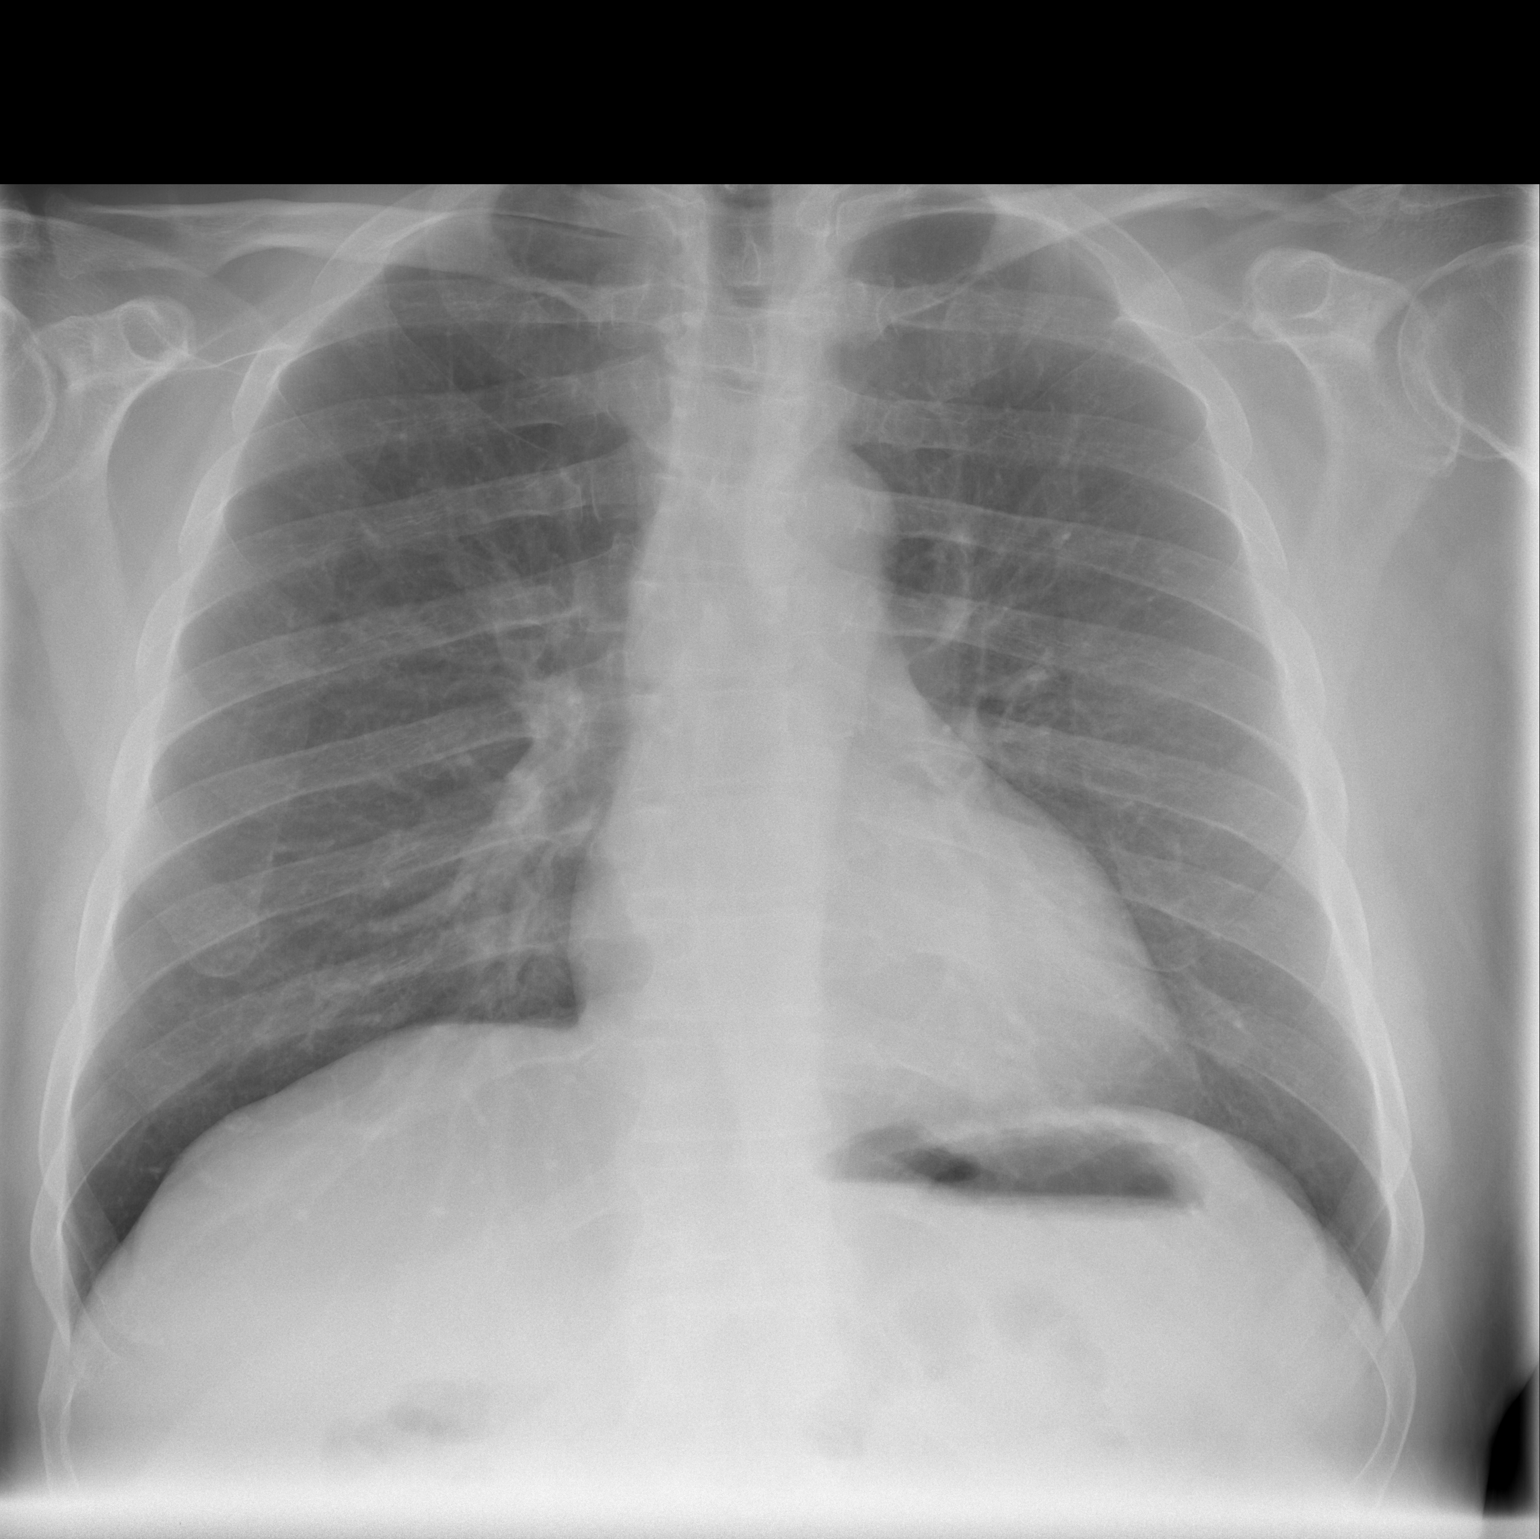

[w chest lat]
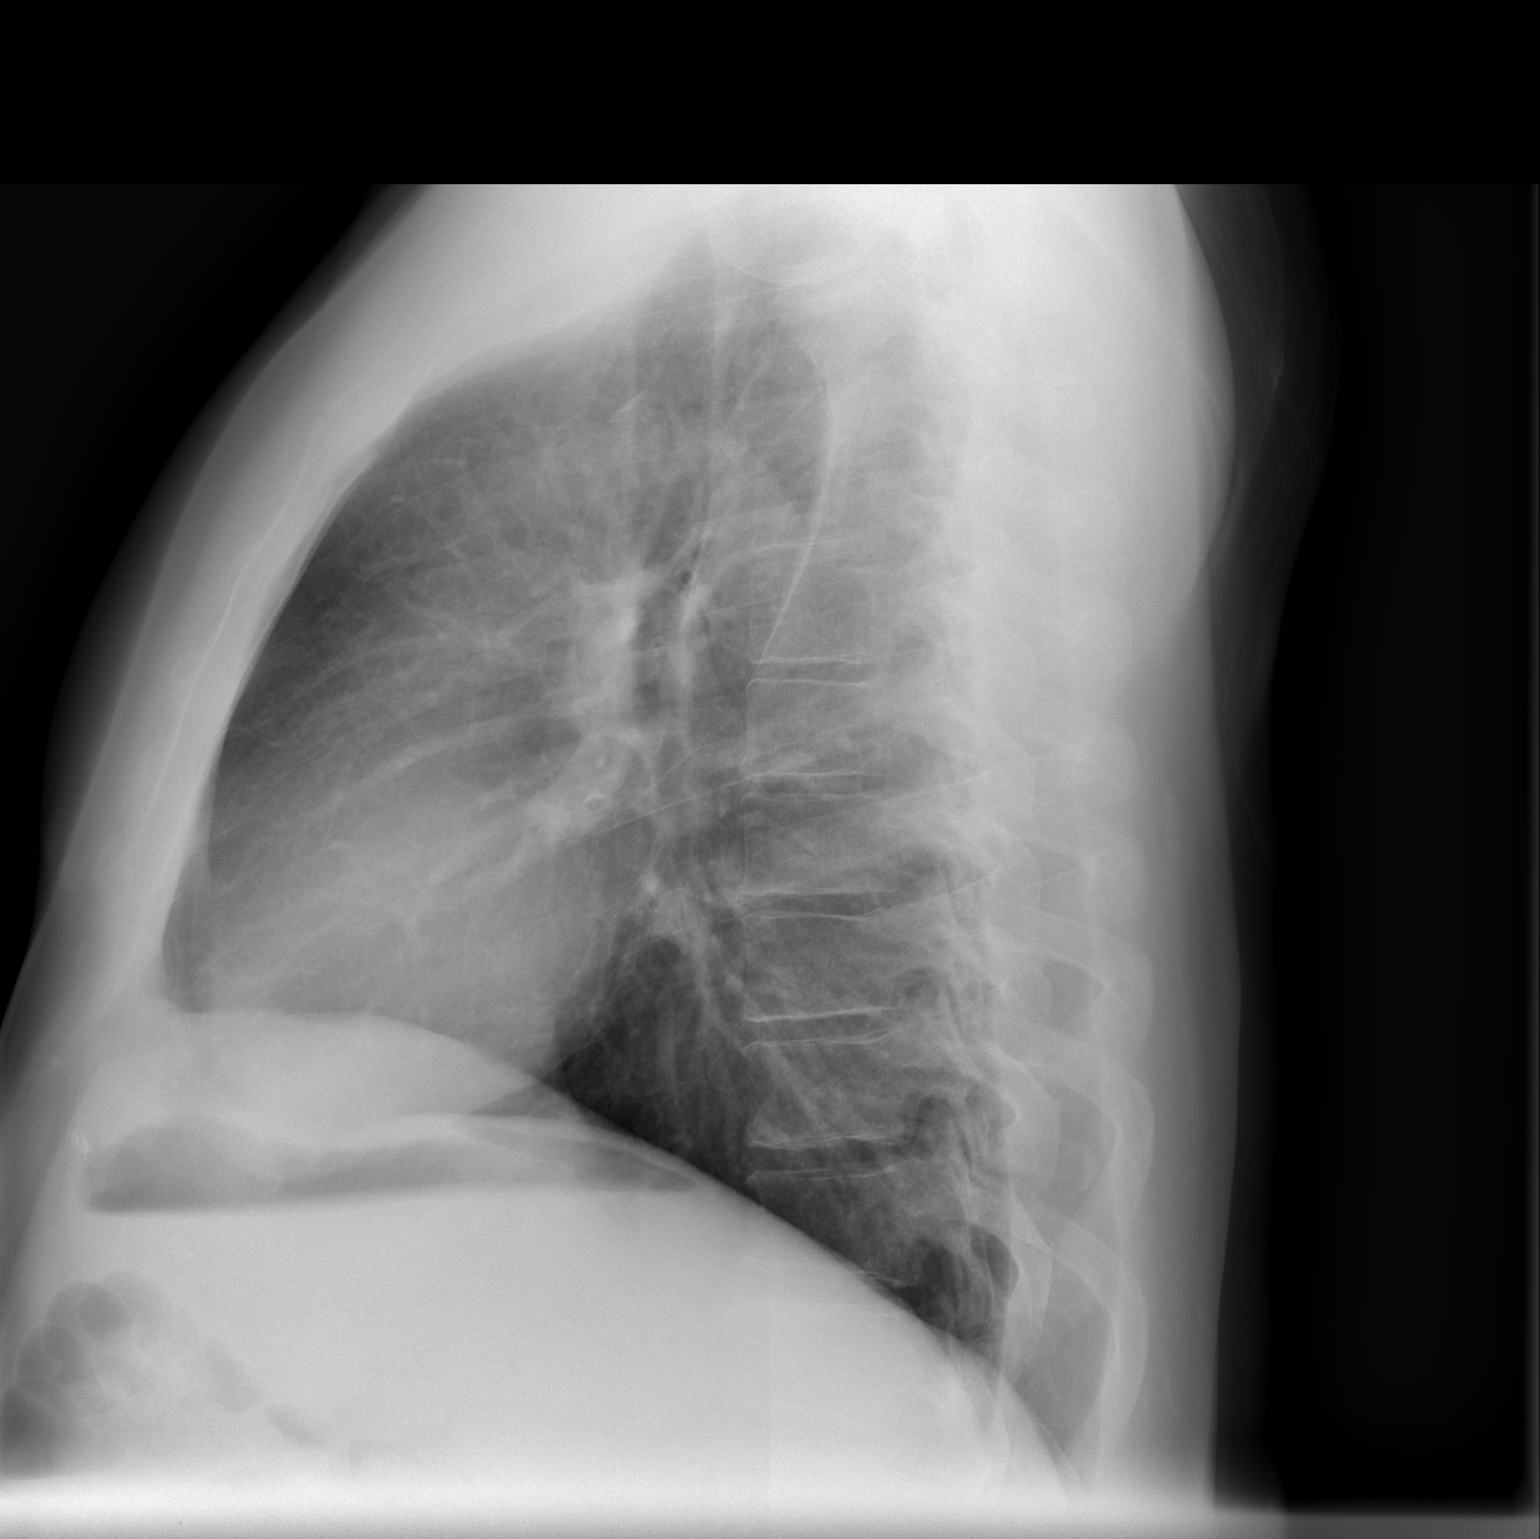

[2 of 2 positions shown; findings below may reference images not displayed]

FINDINGS: The heart size and mediastinal contours are normal. The
lungs are clear. There is no pleural effusion or pneumothorax. No
acute osseous findings are identified. Prominent nipple shadows are
noted bilaterally.
IMPRESSION: Stable examination.  No active cardiopulmonary process.

## 2014-12-16 ENCOUNTER — Encounter (HOSPITAL_COMMUNITY)
Admission: RE | Admit: 2014-12-16 | Discharge: 2014-12-16 | Disposition: A | Discharge status: Home / Self Care | Payer: Self-pay | Source: Ambulatory Visit | Attending: Nephrology | Admitting: Nephrology

## 2014-12-16 DIAGNOSIS — D631 Anemia in chronic kidney disease: Secondary | ICD-10-CM | POA: Insufficient documentation

## 2014-12-16 DIAGNOSIS — N183 Chronic kidney disease, stage 3 (moderate): Secondary | ICD-10-CM | POA: Insufficient documentation

## 2014-12-16 LAB — IRON AND TIBC
Iron: 67 ug/dL (ref 42–165)
Saturation Ratios: 29 % (ref 20–55)
TIBC: 230 ug/dL (ref 215–435)
UIBC: 163 ug/dL (ref 125–400)

## 2014-12-16 LAB — FERRITIN: Ferritin: 406 ng/mL — ABNORMAL HIGH (ref 22–322)

## 2014-12-16 LAB — POCT HEMOGLOBIN-HEMACUE: HEMOGLOBIN: 11.3 g/dL — AB (ref 13.0–17.0)

## 2014-12-16 MED ORDER — DARBEPOETIN ALFA 60 MCG/0.3ML IJ SOSY
60.0000 ug | PREFILLED_SYRINGE | INTRAMUSCULAR | Status: DC
  Administered 2014-12-16: 60 ug via SUBCUTANEOUS

## 2014-12-16 MED ORDER — DARBEPOETIN ALFA 60 MCG/0.3ML IJ SOSY
PREFILLED_SYRINGE | INTRAMUSCULAR | Status: AC
  Filled 2014-12-16: qty 0.3

## 2014-12-23 ENCOUNTER — Encounter (HOSPITAL_COMMUNITY)
Admission: RE | Admit: 2014-12-23 | Discharge: 2014-12-23 | Disposition: A | Discharge status: Home / Self Care | Payer: Self-pay | Source: Ambulatory Visit | Attending: Nephrology | Admitting: Nephrology

## 2014-12-23 LAB — POCT HEMOGLOBIN-HEMACUE: Hemoglobin: 11.7 g/dL — ABNORMAL LOW (ref 13.0–17.0)

## 2014-12-23 MED ORDER — DARBEPOETIN ALFA 60 MCG/0.3ML IJ SOSY
60.0000 ug | PREFILLED_SYRINGE | INTRAMUSCULAR | Status: DC
  Administered 2014-12-23: 60 ug via SUBCUTANEOUS

## 2014-12-23 MED ORDER — DARBEPOETIN ALFA 60 MCG/0.3ML IJ SOSY
PREFILLED_SYRINGE | INTRAMUSCULAR | Status: AC
  Filled 2014-12-23: qty 0.3

## 2014-12-30 ENCOUNTER — Encounter (HOSPITAL_COMMUNITY)
Admission: RE | Admit: 2014-12-30 | Discharge: 2014-12-30 | Disposition: A | Discharge status: Home / Self Care | Payer: Self-pay | Source: Ambulatory Visit | Attending: Nephrology | Admitting: Nephrology

## 2014-12-30 MED ORDER — DARBEPOETIN ALFA 60 MCG/0.3ML IJ SOSY: 60.0000 ug | PREFILLED_SYRINGE | INTRAMUSCULAR | Status: DC

## 2014-12-31 LAB — POCT HEMOGLOBIN-HEMACUE: Hemoglobin: 12.8 g/dL — ABNORMAL LOW (ref 13.0–17.0)

## 2015-01-03 ENCOUNTER — Other Ambulatory Visit: Payer: Self-pay | Admitting: Internal Medicine

## 2015-01-07 ENCOUNTER — Telehealth: Payer: Self-pay | Admitting: Internal Medicine

## 2015-01-07 ENCOUNTER — Other Ambulatory Visit: Payer: Self-pay | Admitting: Internal Medicine

## 2015-01-07 NOTE — Telephone Encounter (Signed)
Pt.'s wife came in and asked to speak to nurse regarding her huband's medication...Marland KitchenMarland KitchenMarland Kitchen Pt. Needs refill for Metoprolol....pt.'s wife stated she will be returning this Friday to get his medications

## 2015-01-11 ENCOUNTER — Ambulatory Visit: Payer: Self-pay | Attending: Internal Medicine

## 2015-01-11 ENCOUNTER — Encounter: Payer: Medicaid Other | Admitting: Internal Medicine

## 2015-01-11 ENCOUNTER — Other Ambulatory Visit: Payer: Self-pay

## 2015-01-11 ENCOUNTER — Other Ambulatory Visit: Payer: Self-pay | Admitting: Internal Medicine

## 2015-01-11 MED ORDER — METOPROLOL TARTRATE 100 MG PO TABS: 50.0000 mg | ORAL_TABLET | Freq: Two times a day (BID) | ORAL | Status: DC

## 2015-01-12 ENCOUNTER — Encounter: Payer: Self-pay | Admitting: Internal Medicine

## 2015-01-13 ENCOUNTER — Encounter (HOSPITAL_COMMUNITY)
Admission: RE | Admit: 2015-01-13 | Discharge: 2015-01-13 | Disposition: A | Discharge status: Home / Self Care | Payer: Self-pay | Source: Ambulatory Visit | Attending: Nephrology | Admitting: Nephrology

## 2015-01-13 DIAGNOSIS — D631 Anemia in chronic kidney disease: Secondary | ICD-10-CM | POA: Insufficient documentation

## 2015-01-13 DIAGNOSIS — N183 Chronic kidney disease, stage 3 (moderate): Secondary | ICD-10-CM | POA: Insufficient documentation

## 2015-01-13 LAB — IRON AND TIBC
IRON: 80 ug/dL (ref 42–165)
Saturation Ratios: 38 % (ref 20–55)
TIBC: 210 ug/dL — AB (ref 215–435)
UIBC: 130 ug/dL (ref 125–400)

## 2015-01-13 LAB — FERRITIN: Ferritin: 378 ng/mL — ABNORMAL HIGH (ref 22–322)

## 2015-01-13 LAB — POCT HEMOGLOBIN-HEMACUE: Hemoglobin: 11.2 g/dL — ABNORMAL LOW (ref 13.0–17.0)

## 2015-01-13 MED ORDER — DARBEPOETIN ALFA 60 MCG/0.3ML IJ SOSY
60.0000 ug | PREFILLED_SYRINGE | INTRAMUSCULAR | Status: DC
  Administered 2015-01-13: 60 ug via SUBCUTANEOUS

## 2015-01-13 MED ORDER — DARBEPOETIN ALFA 60 MCG/0.3ML IJ SOSY
PREFILLED_SYRINGE | INTRAMUSCULAR | Status: AC
  Filled 2015-01-13: qty 0.3

## 2015-01-14 ENCOUNTER — Other Ambulatory Visit: Payer: Self-pay

## 2015-01-14 MED ORDER — SITAGLIPTIN PHOSPHATE 25 MG PO TABS: 25.0000 mg | ORAL_TABLET | Freq: Every day | ORAL | Status: DC

## 2015-01-14 MED ORDER — APIXABAN 5 MG PO TABS: 5.0000 mg | ORAL_TABLET | Freq: Two times a day (BID) | ORAL | Status: DC

## 2015-01-20 ENCOUNTER — Encounter (HOSPITAL_COMMUNITY)
Admission: RE | Admit: 2015-01-20 | Discharge: 2015-01-20 | Disposition: A | Discharge status: Home / Self Care | Payer: Self-pay | Source: Ambulatory Visit | Attending: Nephrology | Admitting: Nephrology

## 2015-01-20 MED ORDER — DARBEPOETIN ALFA 60 MCG/0.3ML IJ SOSY
60.0000 ug | PREFILLED_SYRINGE | INTRAMUSCULAR | Status: DC
  Administered 2015-01-20: 60 ug via SUBCUTANEOUS

## 2015-01-20 MED ORDER — DARBEPOETIN ALFA 60 MCG/0.3ML IJ SOSY
PREFILLED_SYRINGE | INTRAMUSCULAR | Status: AC
  Filled 2015-01-20: qty 0.3

## 2015-01-21 LAB — POCT HEMOGLOBIN-HEMACUE: Hemoglobin: 11.3 g/dL — ABNORMAL LOW (ref 13.0–17.0)

## 2015-01-27 ENCOUNTER — Encounter (HOSPITAL_COMMUNITY)
Admission: RE | Admit: 2015-01-27 | Discharge: 2015-01-27 | Disposition: A | Discharge status: Home / Self Care | Payer: Self-pay | Source: Ambulatory Visit | Attending: Nephrology | Admitting: Nephrology

## 2015-01-27 DIAGNOSIS — D631 Anemia in chronic kidney disease: Secondary | ICD-10-CM | POA: Insufficient documentation

## 2015-01-27 DIAGNOSIS — N183 Chronic kidney disease, stage 3 (moderate): Secondary | ICD-10-CM | POA: Insufficient documentation

## 2015-01-27 LAB — POCT HEMOGLOBIN-HEMACUE: HEMOGLOBIN: 10.5 g/dL — AB (ref 13.0–17.0)

## 2015-01-27 MED ORDER — DARBEPOETIN ALFA 60 MCG/0.3ML IJ SOSY
PREFILLED_SYRINGE | INTRAMUSCULAR | Status: AC
  Filled 2015-01-27: qty 0.3

## 2015-01-27 MED ORDER — DARBEPOETIN ALFA 60 MCG/0.3ML IJ SOSY
60.0000 ug | PREFILLED_SYRINGE | INTRAMUSCULAR | Status: DC
  Administered 2015-01-27: 60 ug via SUBCUTANEOUS

## 2015-02-03 ENCOUNTER — Encounter (HOSPITAL_COMMUNITY)
Admission: RE | Admit: 2015-02-03 | Discharge: 2015-02-03 | Disposition: A | Discharge status: Home / Self Care | Payer: Self-pay | Source: Ambulatory Visit | Attending: Nephrology | Admitting: Nephrology

## 2015-02-03 LAB — POCT HEMOGLOBIN-HEMACUE: HEMOGLOBIN: 10.9 g/dL — AB (ref 13.0–17.0)

## 2015-02-03 MED ORDER — DARBEPOETIN ALFA 60 MCG/0.3ML IJ SOSY: 60.0000 ug | PREFILLED_SYRINGE | INTRAMUSCULAR | Status: DC

## 2015-02-03 MED ORDER — DARBEPOETIN ALFA 60 MCG/0.3ML IJ SOSY
PREFILLED_SYRINGE | INTRAMUSCULAR | Status: AC
  Administered 2015-02-03: 60 ug via SUBCUTANEOUS
  Filled 2015-02-03: qty 0.3

## 2015-02-07 ENCOUNTER — Ambulatory Visit: Payer: Self-pay | Attending: Internal Medicine | Admitting: Internal Medicine

## 2015-02-07 ENCOUNTER — Encounter: Payer: Self-pay | Admitting: Internal Medicine

## 2015-02-07 VITALS — BP 130/60 | HR 80 | Temp 98.0°F | Resp 16 | Wt 250.6 lb

## 2015-02-07 DIAGNOSIS — I10 Essential (primary) hypertension: Secondary | ICD-10-CM | POA: Insufficient documentation

## 2015-02-07 DIAGNOSIS — E119 Type 2 diabetes mellitus without complications: Secondary | ICD-10-CM | POA: Insufficient documentation

## 2015-02-07 DIAGNOSIS — F1010 Alcohol abuse, uncomplicated: Secondary | ICD-10-CM | POA: Insufficient documentation

## 2015-02-07 DIAGNOSIS — Z950 Presence of cardiac pacemaker: Secondary | ICD-10-CM | POA: Insufficient documentation

## 2015-02-07 DIAGNOSIS — Z89432 Acquired absence of left foot: Secondary | ICD-10-CM | POA: Insufficient documentation

## 2015-02-07 DIAGNOSIS — N289 Disorder of kidney and ureter, unspecified: Secondary | ICD-10-CM

## 2015-02-07 DIAGNOSIS — Z9861 Coronary angioplasty status: Secondary | ICD-10-CM

## 2015-02-07 DIAGNOSIS — I129 Hypertensive chronic kidney disease with stage 1 through stage 4 chronic kidney disease, or unspecified chronic kidney disease: Secondary | ICD-10-CM | POA: Insufficient documentation

## 2015-02-07 DIAGNOSIS — Z87891 Personal history of nicotine dependence: Secondary | ICD-10-CM | POA: Insufficient documentation

## 2015-02-07 DIAGNOSIS — I4891 Unspecified atrial fibrillation: Secondary | ICD-10-CM | POA: Insufficient documentation

## 2015-02-07 DIAGNOSIS — Z7901 Long term (current) use of anticoagulants: Secondary | ICD-10-CM | POA: Insufficient documentation

## 2015-02-07 DIAGNOSIS — E785 Hyperlipidemia, unspecified: Secondary | ICD-10-CM | POA: Insufficient documentation

## 2015-02-07 DIAGNOSIS — Z792 Long term (current) use of antibiotics: Secondary | ICD-10-CM | POA: Insufficient documentation

## 2015-02-07 DIAGNOSIS — I2510 Atherosclerotic heart disease of native coronary artery without angina pectoris: Secondary | ICD-10-CM | POA: Insufficient documentation

## 2015-02-07 DIAGNOSIS — N189 Chronic kidney disease, unspecified: Secondary | ICD-10-CM | POA: Insufficient documentation

## 2015-02-07 DIAGNOSIS — Z955 Presence of coronary angioplasty implant and graft: Secondary | ICD-10-CM | POA: Insufficient documentation

## 2015-02-07 DIAGNOSIS — E139 Other specified diabetes mellitus without complications: Secondary | ICD-10-CM

## 2015-02-07 LAB — COMPLETE METABOLIC PANEL WITH GFR
ALT: 30 U/L (ref 0–53)
AST: 25 U/L (ref 0–37)
Albumin: 3.6 g/dL (ref 3.5–5.2)
Alkaline Phosphatase: 109 U/L (ref 39–117)
BUN: 27 mg/dL — ABNORMAL HIGH (ref 6–23)
CHLORIDE: 101 meq/L (ref 96–112)
CO2: 26 mEq/L (ref 19–32)
Calcium: 9.3 mg/dL (ref 8.4–10.5)
Creat: 1.52 mg/dL — ABNORMAL HIGH (ref 0.50–1.35)
GFR, EST AFRICAN AMERICAN: 58 mL/min — AB
GFR, EST NON AFRICAN AMERICAN: 50 mL/min — AB
Glucose, Bld: 215 mg/dL — ABNORMAL HIGH (ref 70–99)
Potassium: 4.6 mEq/L (ref 3.5–5.3)
SODIUM: 136 meq/L (ref 135–145)
Total Bilirubin: 0.5 mg/dL (ref 0.2–1.2)
Total Protein: 6.8 g/dL (ref 6.0–8.3)

## 2015-02-07 LAB — POCT GLYCOSYLATED HEMOGLOBIN (HGB A1C): Hemoglobin A1C: 7.8

## 2015-02-07 LAB — GLUCOSE, POCT (MANUAL RESULT ENTRY): POC GLUCOSE: 229 mg/dL — AB (ref 70–99)

## 2015-02-07 MED ORDER — GLIPIZIDE 10 MG PO TABS: 10.0000 mg | ORAL_TABLET | Freq: Two times a day (BID) | ORAL | Status: DC

## 2015-02-07 MED ORDER — METOPROLOL TARTRATE 100 MG PO TABS: 50.0000 mg | ORAL_TABLET | Freq: Two times a day (BID) | ORAL | Status: DC

## 2015-02-07 MED ORDER — SITAGLIPTIN PHOSPHATE 25 MG PO TABS: 25.0000 mg | ORAL_TABLET | Freq: Every day | ORAL | Status: DC

## 2015-02-07 NOTE — Patient Instructions (Signed)
DASH Eating Plan DASH stands for "Dietary Approaches to Stop Hypertension." The DASH eating plan is a healthy eating plan that has been shown to reduce high blood pressure (hypertension). Additional health benefits may include reducing the risk of type 2 diabetes mellitus, heart disease, and stroke. The DASH eating plan may also help with weight loss. WHAT DO I NEED TO KNOW ABOUT THE DASH EATING PLAN? For the DASH eating plan, you will follow these general guidelines:  Choose foods with a percent daily value for sodium of less than 5% (as listed on the food label).  Use salt-free seasonings or herbs instead of table salt or sea salt.  Check with your health care provider or pharmacist before using salt substitutes.  Eat lower-sodium products, often labeled as "lower sodium" or "no salt added."  Eat fresh foods.  Eat more vegetables, fruits, and low-fat dairy products.  Choose whole grains. Look for the word "whole" as the first word in the ingredient list.  Choose fish and skinless chicken or turkey more often than red meat. Limit fish, poultry, and meat to 6 oz (170 g) each day.  Limit sweets, desserts, sugars, and sugary drinks.  Choose heart-healthy fats.  Limit cheese to 1 oz (28 g) per day.  Eat more home-cooked food and less restaurant, buffet, and fast food.  Limit fried foods.  Cook foods using methods other than frying.  Limit canned vegetables. If you do use them, rinse them well to decrease the sodium.  When eating at a restaurant, ask that your food be prepared with less salt, or no salt if possible. WHAT FOODS CAN I EAT? Seek help from a dietitian for individual calorie needs. Grains Whole grain or whole wheat bread. Brown rice. Whole grain or whole wheat pasta. Quinoa, bulgur, and whole grain cereals. Low-sodium cereals. Corn or whole wheat flour tortillas. Whole grain cornbread. Whole grain crackers. Low-sodium crackers. Vegetables Fresh or frozen vegetables  (raw, steamed, roasted, or grilled). Low-sodium or reduced-sodium tomato and vegetable juices. Low-sodium or reduced-sodium tomato sauce and paste. Low-sodium or reduced-sodium canned vegetables.  Fruits All fresh, canned (in natural juice), or frozen fruits. Meat and Other Protein Products Ground beef (85% or leaner), grass-fed beef, or beef trimmed of fat. Skinless chicken or turkey. Ground chicken or turkey. Pork trimmed of fat. All fish and seafood. Eggs. Dried beans, peas, or lentils. Unsalted nuts and seeds. Unsalted canned beans. Dairy Low-fat dairy products, such as skim or 1% milk, 2% or reduced-fat cheeses, low-fat ricotta or cottage cheese, or plain low-fat yogurt. Low-sodium or reduced-sodium cheeses. Fats and Oils Tub margarines without trans fats. Light or reduced-fat mayonnaise and salad dressings (reduced sodium). Avocado. Safflower, olive, or canola oils. Natural peanut or almond butter. Other Unsalted popcorn and pretzels. The items listed above may not be a complete list of recommended foods or beverages. Contact your dietitian for more options. WHAT FOODS ARE NOT RECOMMENDED? Grains White bread. White pasta. White rice. Refined cornbread. Bagels and croissants. Crackers that contain trans fat. Vegetables Creamed or fried vegetables. Vegetables in a cheese sauce. Regular canned vegetables. Regular canned tomato sauce and paste. Regular tomato and vegetable juices. Fruits Dried fruits. Canned fruit in light or heavy syrup. Fruit juice. Meat and Other Protein Products Fatty cuts of meat. Ribs, chicken wings, bacon, sausage, bologna, salami, chitterlings, fatback, hot dogs, bratwurst, and packaged luncheon meats. Salted nuts and seeds. Canned beans with salt. Dairy Whole or 2% milk, cream, half-and-half, and cream cheese. Whole-fat or sweetened yogurt. Full-fat   cheeses or blue cheese. Nondairy creamers and whipped toppings. Processed cheese, cheese spreads, or cheese  curds. Condiments Onion and garlic salt, seasoned salt, table salt, and sea salt. Canned and packaged gravies. Worcestershire sauce. Tartar sauce. Barbecue sauce. Teriyaki sauce. Soy sauce, including reduced sodium. Steak sauce. Fish sauce. Oyster sauce. Cocktail sauce. Horseradish. Ketchup and mustard. Meat flavorings and tenderizers. Bouillon cubes. Hot sauce. Tabasco sauce. Marinades. Taco seasonings. Relishes. Fats and Oils Butter, stick margarine, lard, shortening, ghee, and bacon fat. Coconut, palm kernel, or palm oils. Regular salad dressings. Other Pickles and olives. Salted popcorn and pretzels. The items listed above may not be a complete list of foods and beverages to avoid. Contact your dietitian for more information. WHERE CAN I FIND MORE INFORMATION? National Heart, Lung, and [employer] Institute: www.nhlbi.nih.gov/health/health-topics/topics/dash/ Document Released: 10/18/2011 Document Revised: 03/15/2014 Document Reviewed: 09/02/2013 ExitCare Patient Information 2015 [employer]. This information is not intended to replace advice given to you by your health care provider. Make sure you discuss any questions you have with your health care provider. Diabetes Mellitus and Food It is important for you to manage your blood sugar (glucose) level. Your blood glucose level can be greatly affected by what you eat. Eating healthier foods in the appropriate amounts throughout the day at about the same time each day will help you control your blood glucose level. It can also help slow or prevent worsening of your diabetes mellitus. Healthy eating may even help you improve the level of your blood pressure and reach or maintain a healthy weight.  HOW CAN FOOD AFFECT ME? Carbohydrates Carbohydrates affect your blood glucose level more than any other type of food. Your dietitian will help you determine how many carbohydrates to eat at each meal and teach you how to count carbohydrates. Counting  carbohydrates is important to keep your blood glucose at a healthy level, especially if you are using insulin or taking certain medicines for diabetes mellitus. Alcohol Alcohol can cause sudden decreases in blood glucose (hypoglycemia), especially if you use insulin or take certain medicines for diabetes mellitus. Hypoglycemia can be a life-threatening condition. Symptoms of hypoglycemia (sleepiness, dizziness, and disorientation) are similar to symptoms of having too much alcohol.  If your health care provider has given you approval to drink alcohol, do so in moderation and use the following guidelines:  Women should not have more than one drink per day, and men should not have more than two drinks per day. One drink is equal to:  12 oz of beer.  5 oz of wine.  1 oz of hard liquor.  Do not drink on an empty stomach.  Keep yourself hydrated. Have water, diet soda, or unsweetened iced tea.  Regular soda, juice, and other mixers might contain a lot of carbohydrates and should be counted. WHAT FOODS ARE NOT RECOMMENDED? As you make food choices, it is important to remember that all foods are not the same. Some foods have fewer nutrients per serving than other foods, even though they might have the same number of calories or carbohydrates. It is difficult to get your body what it needs when you eat foods with fewer nutrients. Examples of foods that you should avoid that are high in calories and carbohydrates but low in nutrients include:  Trans fats (most processed foods list trans fats on the Nutrition Facts label).  Regular soda.  Juice.  Candy.  Sweets, such as cake, pie, doughnuts, and cookies.  Fried foods. WHAT FOODS CAN I EAT? Have nutrient-rich foods,   which will nourish your body and keep you healthy. The food you should eat also will depend on several factors, including:  The calories you need.  The medicines you take.  Your weight.  Your blood glucose level.  Your  blood pressure level.  Your cholesterol level. You also should eat a variety of foods, including:  Protein, such as meat, poultry, fish, tofu, nuts, and seeds (lean animal proteins are best).  Fruits.  Vegetables.  Dairy products, such as milk, cheese, and yogurt (low fat is best).  Breads, grains, pasta, cereal, rice, and beans.  Fats such as olive oil, trans fat-free margarine, canola oil, avocado, and olives. DOES EVERYONE WITH DIABETES MELLITUS HAVE THE SAME MEAL PLAN? Because every person with diabetes mellitus is different, there is not one meal plan that works for everyone. It is very important that you meet with a dietitian who will help you create a meal plan that is just right for you. Document Released: 07/26/2005 Document Revised: 11/03/2013 Document Reviewed: 09/25/2013 ExitCare Patient Information 2015 [employer]. This information is not intended to replace advice given to you by your health care provider. Make sure you discuss any questions you have with your health care provider.  

## 2015-02-07 NOTE — Progress Notes (Signed)
Patient here for follow up on his diabetes and medication refills

## 2015-02-07 NOTE — Progress Notes (Signed)
MRN: [id number] Name: Alexander [name]  Sex: male Age: 54 y.o. DOB: 10-08-1961  Allergies: Sulfa antibiotics  Chief Complaint  Patient presents with  . Follow-up    HPI: Patient is 54 y.o. male who history of diabetes, hypertension, hyperlipidemia, patient is status post PCI with stent in December 2015, currently being followed up by his cardiologist today his blood pressure initially was elevated, repeat manual blood pressure is 130/60, patient denies any acute symptoms denies any headache dizziness chest and shortness of breath, he denies any hypoglycemic symptoms today his hemoglobin A1c noticed to have slightly trended up, patient is currently taking Januvia and Glucotrol.  Past Medical History  Diagnosis Date  . Hypertension   . Arthritis   . Diabetes mellitus     dx---been  awhile  . Anemia   . Alcohol abuse   . Chronic kidney disease   . Hyperlipemia   . Cataract     BILATERAL  . Cardiac syncope 10/07/14    rapid A fib with 8 sec pauses on converison with syncope- temp pacing wire placed then PPM  . Atrial fibrillation   . Coronary artery disease   . Presence of permanent cardiac pacemaker 10/08/2014    Past Surgical History  Procedure Laterality Date  . Lumbar laminectomy  06/13/2012    Procedure: MICRODISCECTOMY LUMBAR LAMINECTOMY;  Surgeon: Jessy Oto, MD;  Location: [place];  Service: Orthopedics;  Laterality: N/A;  Central laminectomy L2-3, L3-4, L4-5  . Amputation Left 08/19/2013    Procedure: AMPUTATION RAY;  Surgeon: Alta Corning, MD;  Location: [place];  Service: Orthopedics;  Laterality: Left;  ray amputation left 5th  . Femoral-tibial bypass graft Left 10/27/2013    Procedure: BYPASS GRAFT LEFT FEMORAL- POSTERIOR TIBIAL ARTERY;  Surgeon: Angelia Mould, MD;  Location: [place];  Service: Vascular;  Laterality: Left;  . I&d extremity Left 10/27/2013    Procedure: IRRIGATION AND DEBRIDEMENT EXTREMITY- LEFT FOOT;  Surgeon: Angelia Mould, MD;   Location: [place];  Service: Vascular;  Laterality: Left;  . Amputation Left 10/27/2013    Procedure: AMPUTATION DIGIT-LEFT 4TH TOE, 4th and 5th metatarsal.;  Surgeon: Angelia Mould, MD;  Location: [place];  Service: Vascular;  Laterality: Left;  . Amputation Left 11/04/2013    Procedure: LEFT FOOT TRANS-METATARSAL AMPUTATION WITH WOUND CLOSURE ;  Surgeon: Alta Corning, MD;  Location: [place];  Service: Orthopedics;  Laterality: Left;  . Pacemaker insertion  10/08/14    MDT Advisa MRI compatible dual chamber pacemaker implanted by Dr Caryl Comes for syncope with post-termination pauses  . Temporary pacemaker insertion Bilateral 10/07/2014    Procedure: TEMPORARY PACEMAKER INSERTION;  Surgeon: Laverda Page, MD;  Location[facility] C[facility] LAB;  Service: Cardiovascular;  Laterality: Bilateral;  . Cardiac catheterization  10/07/2014    Procedure: LEFT HEART CATH AND CORONARY ANGIOGRAPHY;  Surgeon: Laverda Page, MD;  Location: [facility] CATH LAB;  Service: Cardiovascular;;  . Permanent pacemaker insertion N/A 10/08/2014    Procedure: PERMANENT PACEMAKER INSERTION;  Surgeon: Deboraha Sprang, MD;  Location: [facility] CATH LAB;  Service: Cardiovascular;  Laterality: N/A;  . Percutaneous coronary stent intervention (pci-s)  11/09/2014    des to lad & distal circumflex         Dr  [name]  . Left heart catheterization with coronary angiogram N/A 11/09/2014    Procedure: LEFT HEART CATHETERIZATION WITH CORONARY ANGIOGRAM;  Surgeon: Laverda Page, MD;  Location: [facility] CATH LAB;  Service: Cardiovascular;  Laterality:  N/A;      Medication List       This list is accurate as of: 02/07/15  1:16 PM.  Always use your most recent med list.               ACCU-CHEK ADVANTAGE DIABETES kit  Use as instructed     ACCU-CHEK FASTCLIX LANCETS Misc  Check blood sugar TID & QHS     amiodarone 400 MG tablet  Commonly known as:  PACERONE  Take 0.5 tablets (200 mg total) by mouth daily.     amLODipine 10 MG tablet  Commonly  known as:  NORVASC  Take 1 tablet (10 mg total) by mouth daily.     apixaban 5 MG Tabs tablet  Commonly known as:  ELIQUIS  Take 1 tablet (5 mg total) by mouth 2 (two) times daily.     atorvastatin 10 MG tablet  Commonly known as:  LIPITOR  TAKE 1 TABLET BY MOUTH DAILY.     clopidogrel 75 MG tablet  Commonly known as:  PLAVIX  Take 1 tablet (75 mg total) by mouth daily with breakfast.     furosemide 40 MG tablet  Commonly known as:  LASIX  Take 1 tablet (40 mg total) by mouth every morning.     glipiZIDE 10 MG tablet  Commonly known as:  GLUCOTROL  Take 1 tablet (10 mg total) by mouth 2 (two) times daily.     glucose blood test strip  Use as instructed     glucose blood test strip  Commonly known as:  ACCU-CHEK AVIVA PLUS  Use as instructed     hydrALAZINE 100 MG tablet  Commonly known as:  APRESOLINE  Take 1 tablet (100 mg total) by mouth 3 (three) times daily.     metoprolol 100 MG tablet  Commonly known as:  LOPRESSOR  Take 0.5 tablets (50 mg total) by mouth 2 (two) times daily.     multivitamins ther. w/minerals Tabs tablet  Take 1 tablet by mouth daily. Centrum Silver     sitaGLIPtin 25 MG tablet  Commonly known as:  JANUVIA  Take 1 tablet (25 mg total) by mouth daily.        Meds ordered this encounter  Medications  . glipiZIDE (GLUCOTROL) 10 MG tablet    Sig: Take 1 tablet (10 mg total) by mouth 2 (two) times daily.    Dispense:  60 tablet    Refill:  3  . metoprolol (LOPRESSOR) 100 MG tablet    Sig: Take 0.5 tablets (50 mg total) by mouth 2 (two) times daily.    Dispense:  60 tablet    Refill:  3  . sitaGLIPtin (JANUVIA) 25 MG tablet    Sig: Take 1 tablet (25 mg total) by mouth daily.    Dispense:  90 tablet    Refill:  3    Immunization History  Administered Date(s) Administered  . Influenza,inj,Quad PF,36+ Mos 10/22/2013, 09/06/2014  . Pneumococcal Polysaccharide-23 10/22/2013    Family History  Problem Relation Age of Onset  .  Diabetes type II Mother   . Hypertension Mother   . Liver cancer Father   . Diabetes type II Sister   . Breast cancer Sister   . Diabetes type II Brother   . Kidney failure Brother   . Diabetes type II Sister     History  Substance Use Topics  . Smoking status: Former Smoker -- 1.50 packs/day for 30 years    Types: Cigars  Quit date: 10/07/2014  . Smokeless tobacco: Never Used  . Alcohol Use: 21.0 oz/week    35 Cans of beer per week     Comment: 6 of 12 oz beers a day    Review of Systems   As noted in HPI  Filed Vitals:   02/07/15 1143  BP: 130/60  Pulse:   Temp:   Resp:     Physical Exam  Physical Exam  Constitutional: No distress.  Eyes: EOM are normal. Pupils are equal, round, and reactive to light.  Cardiovascular: Normal rate and regular rhythm.   Pulmonary/Chest: Breath sounds normal. No respiratory distress. He has no wheezes. He has no rales.  Musculoskeletal: He exhibits no edema.    CBC    Component Value Date/Time   WBC 6.7 11/10/2014 0452   RBC 3.37* 11/10/2014 0452   RBC 3.32* 10/25/2013 1636   HGB 10.9* 02/03/2015 0836   HCT 29.7* 11/10/2014 0452   PLT 145* 11/10/2014 0452   MCV 88.1 11/10/2014 0452   LYMPHSABS 1.9 10/07/2014 1636   MONOABS 0.7 10/07/2014 1636   EOSABS 0.1 10/07/2014 1636   BASOSABS 0.1 10/07/2014 1636    CMP     Component Value Date/Time   NA 138 11/10/2014 0452   K 4.5 11/10/2014 0452   CL 111 11/10/2014 0452   CO2 23 11/10/2014 0452   GLUCOSE 161* 11/10/2014 0452   BUN 26* 11/10/2014 0452   CREATININE 1.74* 11/10/2014 0452   CREATININE 2.42* 11/20/2013 1422   CALCIUM 8.3* 11/10/2014 0452   PROT 6.4 10/07/2014 1636   ALBUMIN 3.0* 10/07/2014 1636   AST 22 10/07/2014 1636   ALT 33 10/07/2014 1636   ALKPHOS 100 10/07/2014 1636   BILITOT 0.5 10/07/2014 1636   GFRNONAA 42* 11/10/2014 0452   GFRNONAA 29* 11/20/2013 1422   GFRAA 49* 11/10/2014 0452   GFRAA 34* 11/20/2013 1422    Lab Results  Component  Value Date/Time   CHOL 147 11/20/2013 02:22 PM    No components found for: HGA1C  Lab Results  Component Value Date/Time   AST 22 10/07/2014 04:36 PM    Assessment and Plan  Other specified diabetes mellitus without complications - Plan:  Results for orders placed or performed in visit on 02/07/15  Glucose (CBG)  Result Value Ref Range   POC Glucose 229 (A) 70 - 99 mg/dl  HgB A1c  Result Value Ref Range   Hemoglobin A1C 7.80     HgB A1c has trended up compared to last visit, I have advised patient for diabetes meal planning, also increased the dose of Glucotrol, he'll continue Januvia, will repeat A1c in 3 months., glipiZIDE (GLUCOTROL) 10 MG tablet, sitaGLIPtin (JANUVIA) 25 MG tablet  Essential hypertension, benign - Plan: blood pressure is well controlled continue with metoprolol (LOPRESSOR) 100 MG tablet,also patient is on hydralazine, Norvasc COMPLETE METABOLIC PANEL WITH GFR  S/P PTCA (percutaneous transluminal coronary angioplasty) - Plan:currently patient is being followed up by his cardiologist he is on beta blocker, amiodarone, Lasix, Eliquis , hydralazine COMPLETE METABOLIC PANEL WITH GFR  Renal insufficiency - Plan:will repeat  COMPLETE METABOLIC PANEL WITH GFR   Health Maintenance  -Vaccinations:  up-to-date with flu shot and Pneumovax  Return in about 3 months (around 05/10/2015) for diabetes, hypertension.   This note has been created with Surveyor, quantity. Any transcriptional errors are unintentional.    Lorayne Marek, MD

## 2015-02-08 ENCOUNTER — Telehealth: Payer: Self-pay

## 2015-02-08 NOTE — Telephone Encounter (Signed)
Patient is aware of his lab results 

## 2015-02-08 NOTE — Telephone Encounter (Signed)
-----   Message from Lorayne Marek, MD sent at 02/08/2015  9:34 AM EDT ----- Call and let the patient know that his renal function is improved compared to last blood work.

## 2015-02-09 ENCOUNTER — Other Ambulatory Visit: Payer: Self-pay | Admitting: Internal Medicine

## 2015-02-10 ENCOUNTER — Encounter (HOSPITAL_COMMUNITY)
Admission: RE | Admit: 2015-02-10 | Discharge: 2015-02-10 | Disposition: A | Discharge status: Home / Self Care | Payer: Self-pay | Source: Ambulatory Visit | Attending: Nephrology | Admitting: Nephrology

## 2015-02-10 ENCOUNTER — Other Ambulatory Visit: Payer: Self-pay

## 2015-02-10 LAB — IRON AND TIBC
Iron: 66 ug/dL (ref 42–165)
SATURATION RATIOS: 31 % (ref 20–55)
TIBC: 215 ug/dL (ref 215–435)
UIBC: 149 ug/dL (ref 125–400)

## 2015-02-10 LAB — FERRITIN: Ferritin: 317 ng/mL (ref 22–322)

## 2015-02-10 LAB — POCT HEMOGLOBIN-HEMACUE: Hemoglobin: 11.1 g/dL — ABNORMAL LOW (ref 13.0–17.0)

## 2015-02-10 MED ORDER — GLUCOSE BLOOD VI STRP: ORAL_STRIP | Status: DC

## 2015-02-10 MED ORDER — DARBEPOETIN ALFA 60 MCG/0.3ML IJ SOSY
PREFILLED_SYRINGE | INTRAMUSCULAR | Status: AC
  Filled 2015-02-10: qty 0.3

## 2015-02-10 MED ORDER — DARBEPOETIN ALFA 60 MCG/0.3ML IJ SOSY
60.0000 ug | PREFILLED_SYRINGE | INTRAMUSCULAR | Status: DC
  Administered 2015-02-10: 60 ug via SUBCUTANEOUS

## 2015-02-10 MED ORDER — GLUCOCOM LANCETS 28G MISC: Status: DC

## 2015-02-10 MED ORDER — BLOOD GLUCOSE MONITOR KIT: PACK | Status: DC

## 2015-02-17 ENCOUNTER — Encounter (HOSPITAL_COMMUNITY)
Admission: RE | Admit: 2015-02-17 | Discharge: 2015-02-17 | Disposition: A | Discharge status: Home / Self Care | Payer: Self-pay | Source: Ambulatory Visit | Attending: Nephrology | Admitting: Nephrology

## 2015-02-17 DIAGNOSIS — D631 Anemia in chronic kidney disease: Secondary | ICD-10-CM | POA: Insufficient documentation

## 2015-02-17 DIAGNOSIS — N183 Chronic kidney disease, stage 3 (moderate): Secondary | ICD-10-CM | POA: Insufficient documentation

## 2015-02-17 LAB — POCT HEMOGLOBIN-HEMACUE: HEMOGLOBIN: 12.1 g/dL — AB (ref 13.0–17.0)

## 2015-02-17 MED ORDER — DARBEPOETIN ALFA 60 MCG/0.3ML IJ SOSY: 60.0000 ug | PREFILLED_SYRINGE | INTRAMUSCULAR | Status: DC

## 2015-03-03 ENCOUNTER — Encounter (HOSPITAL_COMMUNITY)
Admission: RE | Admit: 2015-03-03 | Discharge: 2015-03-03 | Disposition: A | Discharge status: Home / Self Care | Payer: Self-pay | Source: Ambulatory Visit | Attending: Nephrology | Admitting: Nephrology

## 2015-03-03 LAB — POCT HEMOGLOBIN-HEMACUE: HEMOGLOBIN: 11.2 g/dL — AB (ref 13.0–17.0)

## 2015-03-03 MED ORDER — DARBEPOETIN ALFA 60 MCG/0.3ML IJ SOSY
60.0000 ug | PREFILLED_SYRINGE | INTRAMUSCULAR | Status: DC
  Administered 2015-03-03: 60 ug via SUBCUTANEOUS

## 2015-03-03 MED ORDER — DARBEPOETIN ALFA 60 MCG/0.3ML IJ SOSY
PREFILLED_SYRINGE | INTRAMUSCULAR | Status: AC
  Filled 2015-03-03: qty 0.3

## 2015-03-10 ENCOUNTER — Encounter (HOSPITAL_COMMUNITY)
Admission: RE | Admit: 2015-03-10 | Discharge: 2015-03-10 | Disposition: A | Discharge status: Home / Self Care | Payer: Self-pay | Source: Ambulatory Visit | Attending: Nephrology | Admitting: Nephrology

## 2015-03-10 LAB — FERRITIN: Ferritin: 262 ng/mL (ref 22–322)

## 2015-03-10 LAB — IRON AND TIBC
IRON: 56 ug/dL (ref 42–165)
Saturation Ratios: 25 % (ref 20–55)
TIBC: 220 ug/dL (ref 215–435)
UIBC: 164 ug/dL (ref 125–400)

## 2015-03-10 LAB — POCT HEMOGLOBIN-HEMACUE: Hemoglobin: 12.1 g/dL — ABNORMAL LOW (ref 13.0–17.0)

## 2015-03-10 MED ORDER — DARBEPOETIN ALFA 60 MCG/0.3ML IJ SOSY: 60.0000 ug | PREFILLED_SYRINGE | INTRAMUSCULAR | Status: DC

## 2015-03-24 ENCOUNTER — Encounter (HOSPITAL_COMMUNITY)
Admission: RE | Admit: 2015-03-24 | Discharge: 2015-03-24 | Disposition: A | Discharge status: Home / Self Care | Payer: Self-pay | Source: Ambulatory Visit | Attending: Nephrology | Admitting: Nephrology

## 2015-03-24 DIAGNOSIS — D631 Anemia in chronic kidney disease: Secondary | ICD-10-CM | POA: Insufficient documentation

## 2015-03-24 DIAGNOSIS — N183 Chronic kidney disease, stage 3 (moderate): Secondary | ICD-10-CM | POA: Insufficient documentation

## 2015-03-24 LAB — POCT HEMOGLOBIN-HEMACUE: HEMOGLOBIN: 12.3 g/dL — AB (ref 13.0–17.0)

## 2015-03-24 MED ORDER — DARBEPOETIN ALFA 60 MCG/0.3ML IJ SOSY: 60.0000 ug | PREFILLED_SYRINGE | INTRAMUSCULAR | Status: DC

## 2015-03-28 ENCOUNTER — Encounter: Payer: Self-pay | Admitting: Family

## 2015-03-30 ENCOUNTER — Ambulatory Visit (INDEPENDENT_AMBULATORY_CARE_PROVIDER_SITE_OTHER): Payer: Self-pay | Admitting: Family

## 2015-03-30 ENCOUNTER — Encounter: Payer: Self-pay | Admitting: Family

## 2015-03-30 ENCOUNTER — Ambulatory Visit (HOSPITAL_COMMUNITY)
Admission: RE | Admit: 2015-03-30 | Discharge: 2015-03-30 | Disposition: A | Discharge status: Home / Self Care | Payer: Self-pay | Source: Ambulatory Visit | Attending: Vascular Surgery | Admitting: Vascular Surgery

## 2015-03-30 ENCOUNTER — Ambulatory Visit (INDEPENDENT_AMBULATORY_CARE_PROVIDER_SITE_OTHER)
Admission: RE | Admit: 2015-03-30 | Discharge: 2015-03-30 | Disposition: A | Discharge status: Home / Self Care | Payer: Self-pay | Source: Ambulatory Visit | Attending: Vascular Surgery | Admitting: Vascular Surgery

## 2015-03-30 VITALS — BP 143/75 | HR 61 | Resp 16 | Ht 72.0 in | Wt 254.0 lb

## 2015-03-30 DIAGNOSIS — Z48812 Encounter for surgical aftercare following surgery on the circulatory system: Secondary | ICD-10-CM

## 2015-03-30 DIAGNOSIS — Z95828 Presence of other vascular implants and grafts: Secondary | ICD-10-CM

## 2015-03-30 DIAGNOSIS — I739 Peripheral vascular disease, unspecified: Secondary | ICD-10-CM | POA: Insufficient documentation

## 2015-03-30 DIAGNOSIS — Z89432 Acquired absence of left foot: Secondary | ICD-10-CM | POA: Insufficient documentation

## 2015-03-30 DIAGNOSIS — Z87891 Personal history of nicotine dependence: Secondary | ICD-10-CM

## 2015-03-30 DIAGNOSIS — Z9889 Other specified postprocedural states: Secondary | ICD-10-CM

## 2015-03-30 DIAGNOSIS — Z950 Presence of cardiac pacemaker: Secondary | ICD-10-CM | POA: Insufficient documentation

## 2015-03-30 DIAGNOSIS — Z955 Presence of coronary angioplasty implant and graft: Secondary | ICD-10-CM | POA: Insufficient documentation

## 2015-03-30 NOTE — Progress Notes (Signed)
[facility] [place] HISTORY AND PHYSICAL -PAD  History of Present Illness Alexander [name] is a 54 y.o. male patient of Dr. Scot Dock who had initially presented with extensive gangrene of his left foot. He underwent a fifth toe amputation which failed to heal. He had multilevel arterial occlusive disease. On 10/21/2013, he underwent a left common femoral artery to tibial peroneal trunk bypass with a vein graft and Ray amputation of his left fourth toe. He ultimately required a transmetatarsal amputation on the left (Dr Berenice Primas). He comes in for a routine follow up visit.   He sees Dr. Justin Mend at [facility] for CKD, states his kidney function is improving.  The patient denies claudication symptoms with walking, denies non healing wounds. He has been walking more, yard work,  The patient denies any history of stroke, TIA, or MI.  The patient New Medical or Surgical History: had syncopal episodes, found that he had several seconds of no heartbeats, pacemaker placed in November 2015, he also had a heart cath at that time, pt states no problems found.  Pt Diabetic: Yes, states better controlled,  Review of records: A1C March 2016 was 7.8; pt states his blood sugars have improved since then Pt smoker: former smoker (cigars, 10 small cigars/day, states he does not inhale, started at age 35 yrs, quit November 2015 after his syncopal episodes)  Pt meds include: Statin :Yes Betablocker: Yes ASA: Yes Other anticoagulants/antiplatelets: Eliqiuis since atrial fib history in November 2015.       Past Medical History  Diagnosis Date  . Hypertension   . Arthritis   . Diabetes mellitus     dx---been  awhile  . Anemia   . Alcohol abuse   . Chronic kidney disease   . Hyperlipemia   . Cataract     BILATERAL  . Cardiac syncope 10/07/14    rapid A fib with 8 sec pauses on converison with syncope- temp pacing wire placed then PPM  . Atrial fibrillation   . Coronary artery  disease   . Presence of permanent cardiac pacemaker 10/08/2014  . Peripheral vascular disease     Social History History  Substance Use Topics  . Smoking status: Former Smoker -- 1.50 packs/day for 30 years    Types: Cigars    Quit date: 10/07/2014  . Smokeless tobacco: Never Used  . Alcohol Use: 21.0 oz/week    35 Cans of beer per week     Comment: 6 of 12 oz beers a day    Family History Family History  Problem Relation Age of Onset  . Diabetes type II Mother   . Hypertension Mother   . Diabetes Mother   . Liver cancer Father   . Diabetes type II Sister   . Breast cancer Sister   . Diabetes Sister   . Hypertension Sister   . Diabetes type II Brother   . Kidney failure Brother   . Diabetes Brother   . Hypertension Brother   . Diabetes type II Sister     Past Surgical History  Procedure Laterality Date  . Lumbar laminectomy  06/13/2012    Procedure: MICRODISCECTOMY LUMBAR LAMINECTOMY;  Surgeon: Jessy Oto, MD;  Location: [place];  Service: Orthopedics;  Laterality: N/A;  Central laminectomy L2-3, L3-4, L4-5  . Amputation Left 08/19/2013    Procedure: AMPUTATION RAY;  Surgeon: Alta Corning, MD;  Location: [place];  Service: Orthopedics;  Laterality: Left;  ray amputation left 5th  . Femoral-tibial  bypass graft Left 10/27/2013    Procedure: BYPASS GRAFT LEFT FEMORAL- POSTERIOR TIBIAL ARTERY;  Surgeon: Angelia Mould, MD;  Location: [place];  Service: Vascular;  Laterality: Left;  . I&d extremity Left 10/27/2013    Procedure: IRRIGATION AND DEBRIDEMENT EXTREMITY- LEFT FOOT;  Surgeon: Angelia Mould, MD;  Location: [place];  Service: Vascular;  Laterality: Left;  . Amputation Left 10/27/2013    Procedure: AMPUTATION DIGIT-LEFT 4TH TOE, 4th and 5th metatarsal.;  Surgeon: Angelia Mould, MD;  Location: [place];  Service: Vascular;  Laterality: Left;  . Amputation Left 11/04/2013    Procedure: LEFT FOOT TRANS-METATARSAL AMPUTATION WITH WOUND CLOSURE ;  Surgeon:  Alta Corning, MD;  Location: [place];  Service: Orthopedics;  Laterality: Left;  . Pacemaker insertion  10/08/14    MDT Advisa MRI compatible dual chamber pacemaker implanted by Dr Caryl Comes for syncope with post-termination pauses  . Temporary pacemaker insertion Bilateral 10/07/2014    Procedure: TEMPORARY PACEMAKER INSERTION;  Surgeon: Laverda Page, MD;  Location: [facility] CATH LAB;  Service: Cardiovascular;  Laterality: Bilateral;  . Cardiac catheterization  10/07/2014    Procedure: LEFT HEART CATH AND CORONARY ANGIOGRAPHY;  Surgeon: Laverda Page, MD;  Location: [facility] CATH LAB;  Service: Cardiovascular;;  . Permanent pacemaker insertion N/A 10/08/2014    Procedure: PERMANENT PACEMAKER INSERTION;  Surgeon: Deboraha Sprang, MD;  Location: [facility] CATH LAB;  Service: Cardiovascular;  Laterality: N/A;  . Percutaneous coronary stent intervention (pci-s)  11/09/2014    des to lad & distal circumflex         Dr  [name]  . Left heart catheterization with coronary angiogram N/A 11/09/2014    Procedure: LEFT HEART CATHETERIZATION WITH CORONARY ANGIOGRAM;  Surgeon: Laverda Page, MD;  Location: [facility] CATH LAB;  Service: Cardiovascular;  Laterality: N/A;    Allergies  Allergen Reactions  . Sulfa Antibiotics Rash    Current Outpatient Prescriptions  Medication Sig Dispense Refill  . amLODipine (NORVASC) 10 MG tablet Take 1 tablet (10 mg total) by mouth daily. 30 tablet 2  . apixaban (ELIQUIS) 5 MG TABS tablet Take 1 tablet (5 mg total) by mouth 2 (two) times daily. 180 tablet 3  . atorvastatin (LIPITOR) 10 MG tablet TAKE 1 TABLET BY MOUTH DAILY. 30 tablet 2  . chlorthalidone (HYGROTON) 25 MG tablet Take 25 mg by mouth daily. Take Two tablets daily    . clopidogrel (PLAVIX) 75 MG tablet Take 1 tablet (75 mg total) by mouth daily with breakfast. 60 tablet 6  . glipiZIDE (GLUCOTROL) 10 MG tablet Take 1 tablet (10 mg total) by mouth 2 (two) times daily. 60 tablet 3  . hydrALAZINE (APRESOLINE) 100 MG tablet Take  1 tablet (100 mg total) by mouth 3 (three) times daily. (Patient taking differently: Take 100 mg by mouth 2 (two) times daily. ) 90 tablet 3  . metoprolol (LOPRESSOR) 100 MG tablet Take 0.5 tablets (50 mg total) by mouth 2 (two) times daily. 60 tablet 3  . sitaGLIPtin (JANUVIA) 25 MG tablet Take 1 tablet (25 mg total) by mouth daily. 90 tablet 3  . ACCU-CHEK FASTCLIX LANCETS MISC Check blood sugar TID & QHS (Patient not taking: Reported on 03/30/2015) 102 each 2  . amiodarone (PACERONE) 400 MG tablet Take 0.5 tablets (200 mg total) by mouth daily. (Patient not taking: Reported on 03/30/2015) 60 tablet 0  . blood glucose meter kit and supplies KIT Check blood sugar TID & QHS (Patient not taking: Reported on 03/30/2015)  1 each 0  . Blood Glucose Monitoring Suppl (ACCU-CHEK ADVANTAGE DIABETES) kit Use as instructed (Patient not taking: Reported on 03/30/2015) 1 each 0  . furosemide (LASIX) 40 MG tablet Take 1 tablet (40 mg total) by mouth every morning. (Patient not taking: Reported on 03/30/2015) 30 tablet 2  . GlucoCom Lancets MISC Check blood sugar TID & QHS (Patient not taking: Reported on 03/30/2015) 100 each 0  . glucose blood (ACCU-CHEK AVIVA PLUS) test strip Use as instructed (Patient not taking: Reported on 03/30/2015) 100 each 12  . glucose blood (CHOICE DM FORA G20 TEST STRIPS) test strip Check blood sugar TID & QHS (Patient not taking: Reported on 03/30/2015) 100 each 12  . glucose blood test strip Use as instructed (Patient not taking: Reported on 03/30/2015) 100 each 12  . Multiple Vitamins-Minerals (MULTIVITAMINS THER. W/MINERALS) TABS Take 1 tablet by mouth daily. Centrum Silver     No current facility-administered medications for this visit.    ROS: See HPI for pertinent positives and negatives.   Physical Examination  Filed Vitals:   03/30/15 1128 03/30/15 1140  BP: 160/75 143/75  Pulse: 63 61  Resp: 16   Height: 6' (1.829 m)   Weight: 254 lb (115.214 kg)   SpO2: 98%    Body mass  index is 34.44 kg/(m^2).  General: A&O x 3, WDWN, obese male. Gait: normal Eyes: PERRLA. Pulmonary: CTAB, without wheezes , rales or rhonchi. Cardiac: regular Rythm , without detected murmur.     Carotid Bruits Right Left   Negative Negative  Aorta is not palpable. Radial pulses: 2+ palpable and =   VASCULAR EXAM: Extremities without ischemic changes, left lower foot is surgically absent, without Gangrene; without open wounds.     LE Pulses Right Left   FEMORAL 1+ palpable 2+ palpable    POPLITEAL not palpable   Palpable medial graft   POSTERIOR TIBIAL 1+ palpable  1+ palpable    DORSALIS PEDIS  ANTERIOR TIBIAL 1+ palpable  1+ palpable    Abdomen: soft, NT, no palpable masses. Skin: no rashes, no ulcers noted. Musculoskeletal: no muscle wasting or atrophy, left lower foot is surgically absent. Neurologic: A&O X 3; Appropriate Affect ; SENSATION: normal; MOTOR FUNCTION: moving all extremities equally, motor strength 5/5 throughout. Speech is fluent/normal. CN 2-12 intact.         Non-Invasive Vascular Imaging: DATE: 03/30/2015 LOWER EXTREMITY ARTERIAL DUPLEX EVALUATION    INDICATION: PVD    PREVIOUS INTERVENTION(S): Left femoral to tibial artery bypass graft with left metatarsal amputation 10/27/2013    DUPLEX EXAM:     RIGHT  LEFT   Peak Systolic Velocity (cm/s) Ratio (if abnormal) Waveform  Peak Systolic Velocity (cm/s) Ratio (if abnormal) Waveform     Inflow Artery 164  T     Proximal Anastomosis 210  B     Proximal Graft 72  T     Mid Graft 103  T      Distal Graft 181  T     Distal Anastomosis 195  B     Outflow Artery 123  B  .79 Today's ABI / TBI 1.07  .81 Previous ABI / TBI (  03/30/15) .88    Waveform:    M - Monophasic        B - Biphasic       T - Triphasic  If Ankle Brachial Index (ABI) or Toe Brachial Index (TBI) performed, please see complete report  ADDITIONAL FINDINGS:     IMPRESSION:  1. Patent left femoral to tibial artery bypass graft    Compared to the previous exam:  No significant change     ASSESSMENT: ARAM DOMZALSKI is a 54 y.o. male who is s/p left femoral to tibial artery bypass graft with left metatarsal amputation on 10/27/2013. He has no claudication symptoms with walking, no non healing wounds.  Today's left LE arterial Duplex suggests a patent left femoral to tibial artery bypass graft. Left ABI's have improved to normal with triphasic waveforms, normal left TBI. Right ABI remains stable with evidence of mild/moderate arterial occlusive disease. He is more active, walking more.  His atherosclerotic risk factors include uncontrolled DM which is improving and former smoker.  PLAN:  I discussed in depth with the patient the nature of atherosclerosis, and emphasized the importance of maximal medical management including strict control of blood pressure, blood glucose, and lipid levels, obtaining regular exercise, and continued cessation of smoking.  The patient is aware that without maximal medical management the underlying atherosclerotic disease process will progress, limiting the benefit of any interventions.  Based on the patient's vascular studies and examination, pt will return to clinic in 6 months for ABI's and left LE arterial Duplex; he knows to call sooner if he has concerns or problems.  The patient was given information about PAD including signs, symptoms, treatment, what symptoms should prompt the patient to seek immediate medical care, and risk reduction measures to take.  Clemon Chambers, RN, MSN, FNP-C [facility] Phone: [phone]  Clinic MD: Scot Dock  03/30/2015 11:52 AM

## 2015-03-30 NOTE — Progress Notes (Signed)
Filed Vitals:   03/30/15 1128 03/30/15 1140  BP: 160/75 143/75  Pulse: 63 61  Resp: 16   Height: 6' (1.829 m)   Weight: 254 lb (115.214 kg)   SpO2: 98%

## 2015-03-30 NOTE — Patient Instructions (Signed)

## 2015-04-07 ENCOUNTER — Encounter (HOSPITAL_COMMUNITY)
Admission: RE | Admit: 2015-04-07 | Discharge: 2015-04-07 | Disposition: A | Discharge status: Home / Self Care | Payer: Self-pay | Source: Ambulatory Visit | Attending: Nephrology | Admitting: Nephrology

## 2015-04-07 LAB — POCT HEMOGLOBIN-HEMACUE: Hemoglobin: 11.6 g/dL — ABNORMAL LOW (ref 13.0–17.0)

## 2015-04-07 LAB — IRON AND TIBC
IRON: 90 ug/dL (ref 45–182)
SATURATION RATIOS: 36 % (ref 17.9–39.5)
TIBC: 249 ug/dL — ABNORMAL LOW (ref 250–450)
UIBC: 159 ug/dL

## 2015-04-07 LAB — FERRITIN: FERRITIN: 259 ng/mL (ref 24–336)

## 2015-04-07 MED ORDER — DARBEPOETIN ALFA 60 MCG/0.3ML IJ SOSY: 60.0000 ug | PREFILLED_SYRINGE | INTRAMUSCULAR | Status: DC

## 2015-04-07 MED ORDER — DARBEPOETIN ALFA 60 MCG/0.3ML IJ SOSY
PREFILLED_SYRINGE | INTRAMUSCULAR | Status: AC
  Administered 2015-04-07: 60 ug via SUBCUTANEOUS
  Filled 2015-04-07: qty 0.3

## 2015-04-08 ENCOUNTER — Other Ambulatory Visit: Payer: Self-pay | Admitting: Internal Medicine

## 2015-04-14 ENCOUNTER — Other Ambulatory Visit: Payer: Self-pay | Admitting: Internal Medicine

## 2015-04-14 ENCOUNTER — Encounter (HOSPITAL_COMMUNITY)
Admission: RE | Admit: 2015-04-14 | Discharge: 2015-04-14 | Disposition: A | Discharge status: Home / Self Care | Payer: Self-pay | Source: Ambulatory Visit | Attending: Nephrology | Admitting: Nephrology

## 2015-04-14 DIAGNOSIS — D631 Anemia in chronic kidney disease: Secondary | ICD-10-CM | POA: Insufficient documentation

## 2015-04-14 DIAGNOSIS — N183 Chronic kidney disease, stage 3 (moderate): Secondary | ICD-10-CM | POA: Insufficient documentation

## 2015-04-14 LAB — POCT HEMOGLOBIN-HEMACUE: HEMOGLOBIN: 12.1 g/dL — AB (ref 13.0–17.0)

## 2015-04-14 MED ORDER — DARBEPOETIN ALFA 60 MCG/0.3ML IJ SOSY: 60.0000 ug | PREFILLED_SYRINGE | INTRAMUSCULAR | Status: DC

## 2015-04-18 ENCOUNTER — Telehealth: Payer: Self-pay | Admitting: Internal Medicine

## 2015-04-18 ENCOUNTER — Telehealth: Payer: Self-pay

## 2015-04-18 DIAGNOSIS — E139 Other specified diabetes mellitus without complications: Secondary | ICD-10-CM

## 2015-04-18 MED ORDER — ATORVASTATIN CALCIUM 10 MG PO TABS: 10.0000 mg | ORAL_TABLET | Freq: Every day | ORAL | Status: DC

## 2015-04-18 MED ORDER — SITAGLIPTIN PHOSPHATE 25 MG PO TABS: 25.0000 mg | ORAL_TABLET | Freq: Every day | ORAL | Status: DC

## 2015-04-18 NOTE — Telephone Encounter (Signed)
Patient called requesting refills on his [place] and lipitor [facility]

## 2015-04-18 NOTE — Telephone Encounter (Signed)
Patient called requesting medication refill for sitaGLIPtin (JANUVIA) 25 MG tablet and atorvastatin (LIPITOR) 10 MG tablet. Patient states he is completely out of medication. Patient uses [facility] pharmacy. Please f/u with patient

## 2015-04-28 ENCOUNTER — Encounter (HOSPITAL_COMMUNITY)
Admission: RE | Admit: 2015-04-28 | Discharge: 2015-04-28 | Disposition: A | Discharge status: Home / Self Care | Payer: Self-pay | Source: Ambulatory Visit | Attending: Nephrology | Admitting: Nephrology

## 2015-04-28 LAB — POCT HEMOGLOBIN-HEMACUE: Hemoglobin: 11.7 g/dL — ABNORMAL LOW (ref 13.0–17.0)

## 2015-04-28 MED ORDER — DARBEPOETIN ALFA 60 MCG/0.3ML IJ SOSY
PREFILLED_SYRINGE | INTRAMUSCULAR | Status: AC
  Filled 2015-04-28: qty 0.3

## 2015-04-28 MED ORDER — DARBEPOETIN ALFA 60 MCG/0.3ML IJ SOSY
60.0000 ug | PREFILLED_SYRINGE | INTRAMUSCULAR | Status: DC
  Administered 2015-04-28: 60 ug via SUBCUTANEOUS

## 2015-05-05 ENCOUNTER — Encounter (HOSPITAL_COMMUNITY)
Admission: RE | Admit: 2015-05-05 | Discharge: 2015-05-05 | Disposition: A | Discharge status: Home / Self Care | Payer: Self-pay | Source: Ambulatory Visit | Attending: Nephrology | Admitting: Nephrology

## 2015-05-05 DIAGNOSIS — D631 Anemia in chronic kidney disease: Secondary | ICD-10-CM | POA: Insufficient documentation

## 2015-05-05 DIAGNOSIS — N183 Chronic kidney disease, stage 3 (moderate): Secondary | ICD-10-CM | POA: Insufficient documentation

## 2015-05-05 DIAGNOSIS — Z79899 Other long term (current) drug therapy: Secondary | ICD-10-CM | POA: Insufficient documentation

## 2015-05-05 DIAGNOSIS — Z5181 Encounter for therapeutic drug level monitoring: Secondary | ICD-10-CM | POA: Insufficient documentation

## 2015-05-05 LAB — IRON AND TIBC
Iron: 54 ug/dL (ref 45–182)
SATURATION RATIOS: 21 % (ref 17.9–39.5)
TIBC: 258 ug/dL (ref 250–450)
UIBC: 204 ug/dL

## 2015-05-05 LAB — POCT HEMOGLOBIN-HEMACUE: HEMOGLOBIN: 11.6 g/dL — AB (ref 13.0–17.0)

## 2015-05-05 LAB — FERRITIN: FERRITIN: 181 ng/mL (ref 24–336)

## 2015-05-05 MED ORDER — DARBEPOETIN ALFA 60 MCG/0.3ML IJ SOSY
60.0000 ug | PREFILLED_SYRINGE | INTRAMUSCULAR | Status: DC
  Administered 2015-05-05: 60 ug via SUBCUTANEOUS

## 2015-05-05 MED ORDER — DARBEPOETIN ALFA 60 MCG/0.3ML IJ SOSY
PREFILLED_SYRINGE | INTRAMUSCULAR | Status: AC
  Filled 2015-05-05: qty 0.3

## 2015-05-12 ENCOUNTER — Encounter (HOSPITAL_COMMUNITY)
Admission: RE | Admit: 2015-05-12 | Discharge: 2015-05-12 | Disposition: A | Discharge status: Home / Self Care | Payer: Self-pay | Source: Ambulatory Visit | Attending: Nephrology | Admitting: Nephrology

## 2015-05-12 ENCOUNTER — Other Ambulatory Visit: Payer: Self-pay | Admitting: Internal Medicine

## 2015-05-12 LAB — POCT HEMOGLOBIN-HEMACUE: Hemoglobin: 12.3 g/dL — ABNORMAL LOW (ref 13.0–17.0)

## 2015-05-12 MED ORDER — DARBEPOETIN ALFA 60 MCG/0.3ML IJ SOSY: 60.0000 ug | PREFILLED_SYRINGE | INTRAMUSCULAR | Status: DC

## 2015-05-26 ENCOUNTER — Encounter (HOSPITAL_COMMUNITY)
Admission: RE | Admit: 2015-05-26 | Discharge: 2015-05-26 | Disposition: A | Discharge status: Home / Self Care | Payer: Self-pay | Source: Ambulatory Visit | Attending: Nephrology | Admitting: Nephrology

## 2015-05-26 DIAGNOSIS — N183 Chronic kidney disease, stage 3 (moderate): Secondary | ICD-10-CM | POA: Insufficient documentation

## 2015-05-26 DIAGNOSIS — D631 Anemia in chronic kidney disease: Secondary | ICD-10-CM | POA: Insufficient documentation

## 2015-05-26 LAB — POCT HEMOGLOBIN-HEMACUE: HEMOGLOBIN: 11.7 g/dL — AB (ref 13.0–17.0)

## 2015-05-26 MED ORDER — DARBEPOETIN ALFA 60 MCG/0.3ML IJ SOSY
60.0000 ug | PREFILLED_SYRINGE | INTRAMUSCULAR | Status: DC
  Administered 2015-05-26: 60 ug via SUBCUTANEOUS

## 2015-05-26 MED ORDER — DARBEPOETIN ALFA 60 MCG/0.3ML IJ SOSY
PREFILLED_SYRINGE | INTRAMUSCULAR | Status: AC
  Filled 2015-05-26: qty 0.3

## 2015-06-01 ENCOUNTER — Encounter: Payer: Self-pay | Admitting: Cardiology

## 2015-06-02 ENCOUNTER — Encounter (HOSPITAL_COMMUNITY)
Admission: RE | Admit: 2015-06-02 | Discharge: 2015-06-02 | Disposition: A | Discharge status: Home / Self Care | Payer: Self-pay | Source: Ambulatory Visit | Attending: Nephrology | Admitting: Nephrology

## 2015-06-02 LAB — IRON AND TIBC
IRON: 68 ug/dL (ref 45–182)
SATURATION RATIOS: 28 % (ref 17.9–39.5)
TIBC: 242 ug/dL — ABNORMAL LOW (ref 250–450)
UIBC: 174 ug/dL

## 2015-06-02 LAB — FERRITIN: FERRITIN: 207 ng/mL (ref 24–336)

## 2015-06-02 LAB — POCT HEMOGLOBIN-HEMACUE: Hemoglobin: 12 g/dL — ABNORMAL LOW (ref 13.0–17.0)

## 2015-06-02 MED ORDER — DARBEPOETIN ALFA 60 MCG/0.3ML IJ SOSY: 60.0000 ug | PREFILLED_SYRINGE | INTRAMUSCULAR | Status: DC

## 2015-06-14 ENCOUNTER — Other Ambulatory Visit: Payer: Self-pay | Admitting: Internal Medicine

## 2015-06-15 ENCOUNTER — Other Ambulatory Visit (HOSPITAL_COMMUNITY): Payer: Self-pay | Admitting: *Deleted

## 2015-06-16 ENCOUNTER — Encounter (HOSPITAL_COMMUNITY)
Admission: RE | Admit: 2015-06-16 | Discharge: 2015-06-16 | Disposition: A | Discharge status: Home / Self Care | Payer: Self-pay | Source: Ambulatory Visit | Attending: Nephrology | Admitting: Nephrology

## 2015-06-16 DIAGNOSIS — N183 Chronic kidney disease, stage 3 (moderate): Secondary | ICD-10-CM | POA: Insufficient documentation

## 2015-06-16 DIAGNOSIS — D631 Anemia in chronic kidney disease: Secondary | ICD-10-CM | POA: Insufficient documentation

## 2015-06-16 LAB — POCT HEMOGLOBIN-HEMACUE: HEMOGLOBIN: 11.5 g/dL — AB (ref 13.0–17.0)

## 2015-06-16 MED ORDER — DARBEPOETIN ALFA 60 MCG/0.3ML IJ SOSY
PREFILLED_SYRINGE | INTRAMUSCULAR | Status: AC
  Filled 2015-06-16: qty 0.3

## 2015-06-16 MED ORDER — DARBEPOETIN ALFA 60 MCG/0.3ML IJ SOSY
60.0000 ug | PREFILLED_SYRINGE | INTRAMUSCULAR | Status: DC
  Administered 2015-06-16: 60 ug via SUBCUTANEOUS

## 2015-06-22 ENCOUNTER — Other Ambulatory Visit: Payer: Self-pay

## 2015-06-24 ENCOUNTER — Encounter (HOSPITAL_COMMUNITY)
Admission: RE | Admit: 2015-06-24 | Discharge: 2015-06-24 | Disposition: A | Discharge status: Home / Self Care | Payer: Self-pay | Source: Ambulatory Visit | Attending: Nephrology | Admitting: Nephrology

## 2015-06-24 LAB — POCT HEMOGLOBIN-HEMACUE: Hemoglobin: 11.8 g/dL — ABNORMAL LOW (ref 13.0–17.0)

## 2015-06-24 MED ORDER — DARBEPOETIN ALFA 60 MCG/0.3ML IJ SOSY
60.0000 ug | PREFILLED_SYRINGE | INTRAMUSCULAR | Status: DC
Start: 2015-06-24 — End: 2015-06-25
  Administered 2015-06-24: 60 ug via SUBCUTANEOUS

## 2015-06-24 MED ORDER — DARBEPOETIN ALFA 60 MCG/0.3ML IJ SOSY
PREFILLED_SYRINGE | INTRAMUSCULAR | Status: AC
  Filled 2015-06-24: qty 0.3

## 2015-07-01 ENCOUNTER — Encounter (HOSPITAL_COMMUNITY)
Admission: RE | Admit: 2015-07-01 | Discharge: 2015-07-01 | Disposition: A | Discharge status: Home / Self Care | Payer: Self-pay | Source: Ambulatory Visit | Attending: Nephrology | Admitting: Nephrology

## 2015-07-01 LAB — IRON AND TIBC
Iron: 53 ug/dL (ref 45–182)
SATURATION RATIOS: 21 % (ref 17.9–39.5)
TIBC: 252 ug/dL (ref 250–450)
UIBC: 199 ug/dL

## 2015-07-01 LAB — FERRITIN: FERRITIN: 187 ng/mL (ref 24–336)

## 2015-07-01 LAB — POCT HEMOGLOBIN-HEMACUE: Hemoglobin: 11.7 g/dL — ABNORMAL LOW (ref 13.0–17.0)

## 2015-07-01 MED ORDER — DARBEPOETIN ALFA 60 MCG/0.3ML IJ SOSY
PREFILLED_SYRINGE | INTRAMUSCULAR | Status: AC
  Filled 2015-07-01: qty 0.3

## 2015-07-01 MED ORDER — DARBEPOETIN ALFA 60 MCG/0.3ML IJ SOSY
60.0000 ug | PREFILLED_SYRINGE | INTRAMUSCULAR | Status: DC
  Administered 2015-07-01: 60 ug via SUBCUTANEOUS

## 2015-07-08 ENCOUNTER — Encounter (HOSPITAL_COMMUNITY)
Admission: RE | Admit: 2015-07-08 | Discharge: 2015-07-08 | Disposition: A | Discharge status: Home / Self Care | Payer: Self-pay | Source: Ambulatory Visit | Attending: Nephrology | Admitting: Nephrology

## 2015-07-08 LAB — POCT HEMOGLOBIN-HEMACUE: Hemoglobin: 12.1 g/dL — ABNORMAL LOW (ref 13.0–17.0)

## 2015-07-08 MED ORDER — DARBEPOETIN ALFA 60 MCG/0.3ML IJ SOSY: 60.0000 ug | PREFILLED_SYRINGE | INTRAMUSCULAR | Status: DC

## 2015-07-08 NOTE — Discharge Instructions (Signed)
Darbepoetin Alfa injection What is this medicine? DARBEPOETIN ALFA (dar be POE e tin AL fa) helps your body make more red blood cells. It is used to treat anemia caused by chronic kidney failure and chemotherapy. This medicine may be used for other purposes; ask your health care provider or pharmacist if you have questions. COMMON BRAND NAME(S): Aranesp What should I tell my health care provider before I take this medicine? They need to know if you have any of these conditions: -blood clotting disorders or history of blood clots -cancer patient not on chemotherapy -cystic fibrosis -heart disease, such as angina, heart failure, or a history of a heart attack -hemoglobin level of 12 g/dL or greater -high blood pressure -low levels of folate, iron, or vitamin B12 -seizures -an unusual or allergic reaction to darbepoetin, erythropoietin, albumin, hamster proteins, latex, other medicines, foods, dyes, or preservatives -pregnant or trying to get pregnant -breast-feeding How should I use this medicine? This medicine is for injection into a vein or under the skin. It is usually given by a health care professional in a hospital or clinic setting. If you get this medicine at home, you will be taught how to prepare and give this medicine. Do not shake the solution before you withdraw a dose. Use exactly as directed. Take your medicine at regular intervals. Do not take your medicine more often than directed. It is important that you put your used needles and syringes in a special sharps container. Do not put them in a trash can. If you do not have a sharps container, call your pharmacist or healthcare provider to get one. Talk to your pediatrician regarding the use of this medicine in children. While this medicine may be used in children as young as 1 year for selected conditions, precautions do apply. Overdosage: If you think you have taken too much of this medicine contact a poison control center or  emergency room at once. NOTE: This medicine is only for you. Do not share this medicine with others. What if I miss a dose? If you miss a dose, take it as soon as you can. If it is almost time for your next dose, take only that dose. Do not take double or extra doses. What may interact with this medicine? Do not take this medicine with any of the following medications: -epoetin alfa This list may not describe all possible interactions. Give your health care provider a list of all the medicines, herbs, non-prescription drugs, or dietary supplements you use. Also tell them if you smoke, drink alcohol, or use illegal drugs. Some items may interact with your medicine. What should I watch for while using this medicine? Visit your prescriber or health care professional for regular checks on your progress and for the needed blood tests and blood pressure measurements. It is especially important for the doctor to make sure your hemoglobin level is in the desired range, to limit the risk of potential side effects and to give you the best benefit. Keep all appointments for any recommended tests. Check your blood pressure as directed. Ask your doctor what your blood pressure should be and when you should contact him or her. As your body makes more red blood cells, you may need to take iron, folic acid, or vitamin B supplements. Ask your doctor or health care provider which products are right for you. If you have kidney disease continue dietary restrictions, even though this medication can make you feel better. Talk with your doctor or health   care professional about the foods you eat and the vitamins that you take. What side effects may I notice from receiving this medicine? Side effects that you should report to your doctor or health care professional as soon as possible: -allergic reactions like skin rash, itching or hives, swelling of the face, lips, or tongue -breathing problems -changes in vision -chest  pain -confusion, trouble speaking or understanding -feeling faint or lightheaded, falls -high blood pressure -muscle aches or pains -pain, swelling, warmth in the leg -rapid weight gain -severe headaches -sudden numbness or weakness of the face, arm or leg -trouble walking, dizziness, loss of balance or coordination -seizures (convulsions) -swelling of the ankles, feet, hands -unusually weak or tired Side effects that usually do not require medical attention (report to your doctor or health care professional if they continue or are bothersome): -diarrhea -fever, chills (flu-like symptoms) -headaches -nausea, vomiting -redness, stinging, or swelling at site where injected This list may not describe all possible side effects. Call your doctor for medical advice about side effects. You may report side effects to FDA at 1-800-FDA-1088. Where should I keep my medicine? Keep out of the reach of children. Store in a refrigerator between 2 and 8 degrees C (36 and 46 degrees F). Do not freeze. Do not shake. Throw away any unused portion if using a single-dose vial. Throw away any unused medicine after the expiration date. NOTE: This sheet is a summary. It may not cover all possible information. If you have questions about this medicine, talk to your doctor, pharmacist, or health care provider.  2015, [employer]. (2008-10-12 10:23:57)  

## 2015-07-21 ENCOUNTER — Encounter (HOSPITAL_COMMUNITY)
Admission: RE | Admit: 2015-07-21 | Discharge: 2015-07-21 | Disposition: A | Discharge status: Home / Self Care | Payer: Self-pay | Source: Ambulatory Visit | Attending: Nephrology | Admitting: Nephrology

## 2015-07-21 DIAGNOSIS — N183 Chronic kidney disease, stage 3 (moderate): Secondary | ICD-10-CM | POA: Insufficient documentation

## 2015-07-21 DIAGNOSIS — D631 Anemia in chronic kidney disease: Secondary | ICD-10-CM | POA: Insufficient documentation

## 2015-07-21 LAB — POCT HEMOGLOBIN-HEMACUE: Hemoglobin: 12.5 g/dL — ABNORMAL LOW (ref 13.0–17.0)

## 2015-07-21 MED ORDER — DARBEPOETIN ALFA 60 MCG/0.3ML IJ SOSY: 60.0000 ug | PREFILLED_SYRINGE | INTRAMUSCULAR | Status: DC

## 2015-08-04 ENCOUNTER — Encounter (HOSPITAL_COMMUNITY)
Admission: RE | Admit: 2015-08-04 | Discharge: 2015-08-04 | Disposition: A | Discharge status: Home / Self Care | Payer: Self-pay | Source: Ambulatory Visit | Attending: Nephrology | Admitting: Nephrology

## 2015-08-04 LAB — IRON AND TIBC
IRON: 65 ug/dL (ref 45–182)
Saturation Ratios: 27 % (ref 17.9–39.5)
TIBC: 242 ug/dL — AB (ref 250–450)
UIBC: 177 ug/dL

## 2015-08-04 LAB — FERRITIN: FERRITIN: 246 ng/mL (ref 24–336)

## 2015-08-04 LAB — POCT HEMOGLOBIN-HEMACUE: HEMOGLOBIN: 11.7 g/dL — AB (ref 13.0–17.0)

## 2015-08-04 MED ORDER — DARBEPOETIN ALFA 60 MCG/0.3ML IJ SOSY
PREFILLED_SYRINGE | INTRAMUSCULAR | Status: AC
  Filled 2015-08-04: qty 0.3

## 2015-08-04 MED ORDER — DARBEPOETIN ALFA 60 MCG/0.3ML IJ SOSY
60.0000 ug | PREFILLED_SYRINGE | INTRAMUSCULAR | Status: DC
  Administered 2015-08-04: 60 ug via SUBCUTANEOUS

## 2015-08-05 ENCOUNTER — Other Ambulatory Visit: Payer: Self-pay | Admitting: Internal Medicine

## 2015-08-11 ENCOUNTER — Encounter (HOSPITAL_COMMUNITY)
Admission: RE | Admit: 2015-08-11 | Discharge: 2015-08-11 | Disposition: A | Discharge status: Home / Self Care | Payer: Self-pay | Source: Ambulatory Visit | Attending: Nephrology | Admitting: Nephrology

## 2015-08-11 LAB — POCT HEMOGLOBIN-HEMACUE: Hemoglobin: 11.5 g/dL — ABNORMAL LOW (ref 13.0–17.0)

## 2015-08-11 MED ORDER — DARBEPOETIN ALFA 60 MCG/0.3ML IJ SOSY
PREFILLED_SYRINGE | INTRAMUSCULAR | Status: AC
  Filled 2015-08-11: qty 0.3

## 2015-08-11 MED ORDER — DARBEPOETIN ALFA 60 MCG/0.3ML IJ SOSY
60.0000 ug | PREFILLED_SYRINGE | INTRAMUSCULAR | Status: DC
  Administered 2015-08-11: 60 ug via SUBCUTANEOUS

## 2015-08-18 ENCOUNTER — Encounter (HOSPITAL_COMMUNITY)
Admission: RE | Admit: 2015-08-18 | Discharge: 2015-08-18 | Disposition: A | Discharge status: Home / Self Care | Payer: Medicare Other | Source: Ambulatory Visit | Attending: Nephrology | Admitting: Nephrology

## 2015-08-18 DIAGNOSIS — N183 Chronic kidney disease, stage 3 (moderate): Secondary | ICD-10-CM | POA: Diagnosis not present

## 2015-08-18 DIAGNOSIS — D631 Anemia in chronic kidney disease: Secondary | ICD-10-CM | POA: Insufficient documentation

## 2015-08-18 LAB — POCT HEMOGLOBIN-HEMACUE: Hemoglobin: 12.5 g/dL — ABNORMAL LOW (ref 13.0–17.0)

## 2015-08-18 MED ORDER — DARBEPOETIN ALFA 60 MCG/0.3ML IJ SOSY: 60.0000 ug | PREFILLED_SYRINGE | INTRAMUSCULAR | Status: DC

## 2015-08-18 MED ORDER — DARBEPOETIN ALFA 60 MCG/0.3ML IJ SOSY
PREFILLED_SYRINGE | INTRAMUSCULAR | Status: AC
  Filled 2015-08-18: qty 0.3

## 2015-09-01 ENCOUNTER — Encounter (HOSPITAL_COMMUNITY)
Admission: RE | Admit: 2015-09-01 | Discharge: 2015-09-01 | Disposition: A | Discharge status: Home / Self Care | Payer: Medicare Other | Source: Ambulatory Visit | Attending: Nephrology | Admitting: Nephrology

## 2015-09-01 DIAGNOSIS — D631 Anemia in chronic kidney disease: Secondary | ICD-10-CM | POA: Diagnosis not present

## 2015-09-01 LAB — IRON AND TIBC
IRON: 69 ug/dL (ref 45–182)
Saturation Ratios: 24 % (ref 17.9–39.5)
TIBC: 283 ug/dL (ref 250–450)
UIBC: 214 ug/dL

## 2015-09-01 LAB — FERRITIN: FERRITIN: 253 ng/mL (ref 24–336)

## 2015-09-01 LAB — POCT HEMOGLOBIN-HEMACUE: Hemoglobin: 12.5 g/dL — ABNORMAL LOW (ref 13.0–17.0)

## 2015-09-01 MED ORDER — DARBEPOETIN ALFA 60 MCG/0.3ML IJ SOSY: 60.0000 ug | PREFILLED_SYRINGE | INTRAMUSCULAR | Status: DC

## 2015-09-15 ENCOUNTER — Encounter (HOSPITAL_COMMUNITY)
Admission: RE | Admit: 2015-09-15 | Discharge: 2015-09-15 | Disposition: A | Discharge status: Home / Self Care | Payer: Medicare Other | Source: Ambulatory Visit | Attending: Nephrology | Admitting: Nephrology

## 2015-09-15 DIAGNOSIS — D631 Anemia in chronic kidney disease: Secondary | ICD-10-CM | POA: Insufficient documentation

## 2015-09-15 DIAGNOSIS — N183 Chronic kidney disease, stage 3 (moderate): Secondary | ICD-10-CM | POA: Diagnosis not present

## 2015-09-15 LAB — POCT HEMOGLOBIN-HEMACUE: HEMOGLOBIN: 11.8 g/dL — AB (ref 13.0–17.0)

## 2015-09-15 MED ORDER — DARBEPOETIN ALFA 60 MCG/0.3ML IJ SOSY
PREFILLED_SYRINGE | INTRAMUSCULAR | Status: AC
  Filled 2015-09-15: qty 0.3

## 2015-09-15 MED ORDER — DARBEPOETIN ALFA 60 MCG/0.3ML IJ SOSY
60.0000 ug | PREFILLED_SYRINGE | INTRAMUSCULAR | Status: DC
  Administered 2015-09-15: 60 ug via SUBCUTANEOUS

## 2015-09-22 ENCOUNTER — Encounter (HOSPITAL_COMMUNITY)
Admission: RE | Admit: 2015-09-22 | Discharge: 2015-09-22 | Disposition: A | Discharge status: Home / Self Care | Payer: Medicare Other | Source: Ambulatory Visit | Attending: Nephrology | Admitting: Nephrology

## 2015-09-22 DIAGNOSIS — D631 Anemia in chronic kidney disease: Secondary | ICD-10-CM | POA: Diagnosis not present

## 2015-09-22 LAB — POCT HEMOGLOBIN-HEMACUE: Hemoglobin: 12.3 g/dL — ABNORMAL LOW (ref 13.0–17.0)

## 2015-09-22 MED ORDER — DARBEPOETIN ALFA 60 MCG/0.3ML IJ SOSY: 60.0000 ug | PREFILLED_SYRINGE | INTRAMUSCULAR | Status: DC

## 2015-09-27 ENCOUNTER — Encounter: Payer: Self-pay | Admitting: Family

## 2015-09-28 ENCOUNTER — Ambulatory Visit (INDEPENDENT_AMBULATORY_CARE_PROVIDER_SITE_OTHER): Payer: Medicare Other | Admitting: Family

## 2015-09-28 ENCOUNTER — Encounter: Payer: Self-pay | Admitting: Family

## 2015-09-28 VITALS — BP 132/74 | HR 60 | Temp 98.4°F | Resp 16 | Ht 72.0 in | Wt 250.0 lb

## 2015-09-28 DIAGNOSIS — Z87891 Personal history of nicotine dependence: Secondary | ICD-10-CM

## 2015-09-28 DIAGNOSIS — Z95828 Presence of other vascular implants and grafts: Secondary | ICD-10-CM | POA: Diagnosis not present

## 2015-09-28 DIAGNOSIS — S90121A Contusion of right lesser toe(s) without damage to nail, initial encounter: Secondary | ICD-10-CM

## 2015-09-28 DIAGNOSIS — I739 Peripheral vascular disease, unspecified: Secondary | ICD-10-CM | POA: Diagnosis not present

## 2015-09-28 NOTE — Progress Notes (Signed)
Filed Vitals:   09/28/15 1017 09/28/15 1031  BP: 151/78 132/74  Pulse: 62 60  Temp: 98.4 F (36.9 C)   TempSrc: Oral   Resp: 16   Height: 6' (1.829 m)   Weight: 250 lb (113.399 kg)   SpO2: 98%

## 2015-09-28 NOTE — Progress Notes (Signed)
[facility] [place] HISTORY AND PHYSICAL -PAD  History of Present Illness JARRETTE DEHNER is a 54 y.o. male patient of Dr. Scot Dock who had initially presented with extensive gangrene of his left foot. He underwent a fifth toe amputation which failed to heal. He had multilevel arterial occlusive disease. On 10/21/2013, he underwent a left common femoral artery to tibial peroneal trunk bypass with a vein graft and Ray amputation of his left fourth toe. He ultimately required a transmetatarsal amputation on the left (Dr Berenice Primas). He comes in today a month ahead of schedule at the request of Dr. [name] to evaluate black spot on the tip of his right second toe that his wife first noticed a few days ago. He denies any known injury or stubbing his toe. He denies pain in his toe or foot.  He sees Dr. Justin Mend at [facility] for CKD, states his kidney function is improving.  The patient denies claudication symptoms with walking, denies non healing wounds. He has been walking more, yard work,  The patient denies any history of stroke, TIA, or MI.  The patient New Medical or Surgical History: had syncopal episodes, found that he had several seconds of no heartbeats, pacemaker placed in November 2015, he also had a heart cath at that time, pt states no problems found.  Pt Diabetic: Yes, states better controlled, Review of records: A1C March 2016 was 7.8; pt states his fasting blood sugars lately have been up to 300, is working with his PCP to get his blood sugar under better control  Pt smoker: former smoker (cigars, was 10 small cigars/day, states he did not inhale, started at age 74 yrs, quit November 2015 after his syncopal episodes)  Pt meds include: Statin :Yes Betablocker: Yes ASA: Yes Other anticoagulants/antiplatelets: Eliqiuis since atrial fib history in November 2015.     Past Medical History  Diagnosis Date  . Hypertension   . Arthritis   . Diabetes mellitus      dx---been  awhile  . Anemia   . Alcohol abuse   . Chronic kidney disease   . Hyperlipemia   . Cataract     BILATERAL  . Cardiac syncope 10/07/14    rapid A fib with 8 sec pauses on converison with syncope- temp pacing wire placed then PPM  . Atrial fibrillation ([place])   . Coronary artery disease   . Presence of permanent cardiac pacemaker 10/08/2014  . Peripheral vascular disease [facility])     Social History Social History  Substance Use Topics  . Smoking status: Former Smoker -- 1.50 packs/day for 30 years    Types: Cigars    Quit date: 10/07/2014  . Smokeless tobacco: Never Used  . Alcohol Use: 21.0 oz/week    35 Cans of beer per week     Comment: 6 of 12 oz beers a day    Family History Family History  Problem Relation Age of Onset  . Diabetes type II Mother   . Hypertension Mother   . Diabetes Mother   . Liver cancer Father   . Diabetes type II Sister   . Breast cancer Sister   . Diabetes Sister   . Hypertension Sister   . Diabetes type II Brother   . Kidney failure Brother   . Diabetes Brother   . Hypertension Brother   . Diabetes type II Sister     Past Surgical History  Procedure Laterality Date  . Lumbar laminectomy  06/13/2012  Procedure: MICRODISCECTOMY LUMBAR LAMINECTOMY;  Surgeon: Jessy Oto, MD;  Location: [place];  Service: Orthopedics;  Laterality: N/A;  Central laminectomy L2-3, L3-4, L4-5  . Amputation Left 08/19/2013    Procedure: AMPUTATION RAY;  Surgeon: Alta Corning, MD;  Location: [place];  Service: Orthopedics;  Laterality: Left;  ray amputation left 5th  . Femoral-tibial bypass graft Left 10/27/2013    Procedure: BYPASS GRAFT LEFT FEMORAL- POSTERIOR TIBIAL ARTERY;  Surgeon: Angelia Mould, MD;  Location: [place];  Service: Vascular;  Laterality: Left;  . I&d extremity Left 10/27/2013    Procedure: IRRIGATION AND DEBRIDEMENT EXTREMITY- LEFT FOOT;  Surgeon: Angelia Mould, MD;  Location: [place];  Service: Vascular;  Laterality: Left;   . Amputation Left 10/27/2013    Procedure: AMPUTATION DIGIT-LEFT 4TH TOE, 4th and 5th metatarsal.;  Surgeon: Angelia Mould, MD;  Location: [place];  Service: Vascular;  Laterality: Left;  . Amputation Left 11/04/2013    Procedure: LEFT FOOT TRANS-METATARSAL AMPUTATION WITH WOUND CLOSURE ;  Surgeon: Alta Corning, MD;  Location: [place];  Service: Orthopedics;  Laterality: Left;  . Pacemaker insertion  10/08/14    MDT Advisa MRI compatible dual chamber pacemaker implanted by Dr Caryl Comes for syncope with post-termination pauses  . Temporary pacemaker insertion Bilateral 10/07/2014    Procedure: TEMPORARY PACEMAKER INSERTION;  Surgeon: Laverda Page, MD;  Location: [facility] CATH LAB;  Service: Cardiovascular;  Laterality: Bilateral;  . Cardiac catheterization  10/07/2014    Procedure: LEFT HEART CATH AND CORONARY ANGIOGRAPHY;  Surgeon: Laverda Page, MD;  Location: [facility] CATH LAB;  Service: Cardiovascular;;  . Permanent pacemaker insertion N/A 10/08/2014    Procedure: PERMANENT PACEMAKER INSERTION;  Surgeon: Deboraha Sprang, MD;  Location: [facility] CATH LAB;  Service: Cardiovascular;  Laterality: N/A;  . Percutaneous coronary stent intervention (pci-s)  11/09/2014    des to lad & distal circumflex         Dr  [name]  . Left heart catheterization with coronary angiogram N/A 11/09/2014    Procedure: LEFT HEART CATHETERIZATION WITH CORONARY ANGIOGRAM;  Surgeon: Laverda Page, MD;  Location: [facility] C[facility] LAB;  Service: Cardiovascular;  Laterality: N/A;    Allergies  Allergen Reactions  . Sulfa Antibiotics Rash    Current Outpatient Prescriptions  Medication Sig Dispense Refill  . ACCU-CHEK FASTCLIX LANCETS MISC Check blood sugar TID & QHS 102 each 2  . amLODipine (NORVASC) 10 MG tablet Take 1 tablet (10 mg total) by mouth daily. 30 tablet 2  . apixaban (ELIQUIS) 5 MG TABS tablet Take 1 tablet (5 mg total) by mouth 2 (two) times daily. 180 tablet 3  . aspirin 81 MG tablet Take 81 mg by mouth daily.     Marland Kitchen atorvastatin (LIPITOR) 10 MG tablet Take 1 tablet (10 mg total) by mouth daily. (Patient taking differently: Take 20 mg by mouth daily. ) 30 tablet 2  . blood glucose meter kit and supplies KIT Check blood sugar TID & QHS 1 each 0  . Blood Glucose Monitoring Suppl (ACCU-CHEK ADVANTAGE DIABETES) kit Use as instructed 1 each 0  . chlorthalidone (HYGROTON) 25 MG tablet Take 25 mg by mouth daily. Take Two tablets daily    . clopidogrel (PLAVIX) 75 MG tablet Take 1 tablet (75 mg total) by mouth daily with breakfast. 60 tablet 6  . glipiZIDE (GLUCOTROL) 10 MG tablet TAKE 1 TABLET BY MOUTH 2 TIMES DAILY. 60 tablet 3  . GlucoCom Lancets MISC Check blood sugar TID &  QHS 100 each 0  . glucose blood (ACCU-CHEK AVIVA PLUS) test strip Use as instructed 100 each 12  . glucose blood (CHOICE DM FORA G20 TEST STRIPS) test strip Check blood sugar TID & QHS 100 each 12  . glucose blood test strip Use as instructed 100 each 12  . hydrALAZINE (APRESOLINE) 100 MG tablet TAKE ONE TABLET BY MOUTH THREE TIMES DAILY 90 tablet 1  . metoprolol (LOPRESSOR) 100 MG tablet Take 0.5 tablets (50 mg total) by mouth 2 (two) times daily. 60 tablet 3  . Multiple Vitamins-Minerals (MULTIVITAMINS THER. W/MINERALS) TABS Take 1 tablet by mouth daily. Centrum Silver    . sitaGLIPtin (JANUVIA) 25 MG tablet Take 1 tablet (25 mg total) by mouth daily. 90 tablet 3  . TRESIBA FLEXTOUCH 100 UNIT/ML SOPN daily.  0  . amiodarone (PACERONE) 400 MG tablet Take 0.5 tablets (200 mg total) by mouth daily. (Patient not taking: Reported on 03/30/2015) 60 tablet 0  . atorvastatin (LIPITOR) 10 MG tablet TAKE 1 TABLET BY MOUTH DAILY. (Patient not taking: Reported on 09/28/2015) 30 tablet 0  . furosemide (LASIX) 40 MG tablet Take 1 tablet (40 mg total) by mouth every morning. (Patient not taking: Reported on 03/30/2015) 30 tablet 2   No current facility-administered medications for this visit.    ROS: See HPI for pertinent positives and  negatives.   Physical Examination  Filed Vitals:   09/28/15 1017 09/28/15 1031  BP: 151/78 132/74  Pulse: 62 60  Temp: 98.4 F (36.9 C)   TempSrc: Oral   Resp: 16   Height: 6' (1.829 m)   Weight: 250 lb (113.399 kg)   SpO2: 98%    Body mass index is 33.9 kg/(m^2).  General: A&O x 3, WDWN, obese male. Gait: normal Eyes: PERRLA. Pulmonary: CTAB, without wheezes , rales or rhonchi. Cardiac: regular rhythm, no detected murmur.     Carotid Bruits Right Left   Negative Negative  Aorta is not palpable. Radial pulses: 2+ palpable and =   VASCULAR EXAM: Extremities without ischemic changes, left lower foot is surgically absent, without Gangrene; without open wounds. Small hematoma at the tip of right 2nd toe, soft, dark fluid filled, well circumscribed, no drainage.     LE Pulses Right Left   FEMORAL 2+ palpable 2+ palpable    POPLITEAL not palpable   Palpable medial graft   POSTERIOR TIBIAL not palpable, audible Doppler signal  not palpable, audible Doppler signal    DORSALIS PEDIS  ANTERIOR TIBIAL Not palpable, audible Doppler signal  not palpable, audible Doppler signal    Abdomen: soft, NT, no palpable masses. Skin: no rashes, no ulcers, see Extremities. Musculoskeletal: no muscle wasting or atrophy, left lower foot is surgically absent. Neurologic: A&O X 3; Appropriate Affect ; SENSATION: normal; MOTOR FUNCTION: moving all extremities equally, motor strength 5/5 throughout. Speech is fluent/normal. CN 2-12 intact.                ASSESSMENT: CAMERON SCHWINN is a 54 y.o. male who had initially presented with extensive gangrene of his left foot. He underwent a fifth toe amputation which failed to heal. He had  multilevel arterial occlusive disease. On 10/21/2013, he underwent a left common femoral artery to tibial peroneal trunk bypass with a vein graft and Ray amputation of his left fourth toe. He ultimately required a transmetatarsal amputation on the left (Dr Berenice Primas). He comes in today a month ahead of schedule at the request of Dr. [name] to evaluate black spot  on the tip of his right second toe that his wife first noticed a few days ago. He denies any known injury or stubbing his toe. He denies pain in his toe or foot.   Small hematoma at tip of right second toe; pt advised to protect his feet, his body will absorb the hematoma.   PLAN:  Based on the patient's vascular studies and examination, pt will return to clinic as already scheduled on 11/04/15  I discussed in depth with the patient the nature of atherosclerosis, and emphasized the importance of maximal medical management including strict control of blood pressure, blood glucose, and lipid levels, obtaining regular exercise, and continued cessation of smoking.  The patient is aware that without maximal medical management the underlying atherosclerotic disease process will progress, limiting the benefit of any interventions.  The patient was given information about PAD including signs, symptoms, treatment, what symptoms should prompt the patient to seek immediate medical care, and risk reduction measures to take.  Clemon Chambers, RN, MSN, FNP-C [facility] Phone: [phone]  Clinic MD: Scot Dock  09/28/2015 10:36 AM

## 2015-09-28 NOTE — Patient Instructions (Signed)
Peripheral Vascular Disease Peripheral vascular disease (PVD) is a disease of the blood vessels that are not part of your heart and brain. A simple term for PVD is poor circulation. In most cases, PVD narrows the blood vessels that carry blood from your heart to the rest of your body. This can result in a decreased supply of blood to your arms, legs, and internal organs, like your stomach or kidneys. However, it most often affects a person's lower legs and feet. There are two types of PVD.  Organic PVD. This is the more common type. It is caused by damage to the structure of blood vessels.  Functional PVD. This is caused by conditions that make blood vessels contract and tighten (spasm). Without treatment, PVD tends to get worse over time. PVD can also lead to acute ischemic limb. This is when an arm or limb suddenly has trouble getting enough blood. This is a medical emergency. CAUSES Each type of PVD has many different causes. The most common cause of PVD is buildup of a fatty material (plaque) inside of your arteries (atherosclerosis). Small amounts of plaque can break off from the walls of the blood vessels and become lodged in a smaller artery. This blocks blood flow and can cause acute ischemic limb. Other common causes of PVD include:  Blood clots that form inside of blood vessels.  Injuries to blood vessels.  Diseases that cause inflammation of blood vessels or cause blood vessel spasms.  Health behaviors and health history that increase your risk of developing PVD. RISK FACTORS  You may have a greater risk of PVD if you:  Have a family history of PVD.  Have certain medical conditions, including:  High cholesterol.  Diabetes.  High blood pressure (hypertension).  Coronary heart disease.  Past problems with blood clots.  Past injury, such as burns or a broken bone. These may have damaged blood vessels in your limbs.  Buerger disease. This is caused by inflamed blood  vessels in your hands and feet.  Some forms of arthritis.  Rare birth defects that affect the arteries in your legs.  Use tobacco.  Do not get enough exercise.  Are obese.  Are age 50 or older. SIGNS AND SYMPTOMS  PVD may cause many different symptoms. Your symptoms depend on what part of your body is not getting enough blood. Some common signs and symptoms include:  Cramps in your lower legs. This may be a symptom of poor leg circulation (claudication).  Pain and weakness in your legs while you are physically active that goes away when you rest (intermittent claudication).  Leg pain when at rest.  Leg numbness, tingling, or weakness.  Coldness in a leg or foot, especially when compared with the other leg.  Skin or hair changes. These can include:  Hair loss.  Shiny skin.  Pale or bluish skin.  Thick toenails.  Inability to get or maintain an erection (erectile dysfunction). People with PVD are more prone to developing ulcers and sores on their toes, feet, or legs. These may take longer than normal to heal. DIAGNOSIS Your health care provider may diagnose PVD from your signs and symptoms. The health care provider will also do a physical exam. You may have tests to find out what is causing your PVD and determine its severity. Tests may include:  Blood pressure recordings from your arms and legs and measurements of the strength of your pulses (pulse volume recordings).  Imaging studies using sound waves to take pictures of   the blood flow through your blood vessels (Doppler ultrasound).  Injecting a dye into your blood vessels before having imaging studies using:  X-rays (angiogram or arteriogram).  Computer-generated X-rays (CT angiogram).  A powerful electromagnetic field and a computer (magnetic resonance angiogram or MRA). TREATMENT Treatment for PVD depends on the cause of your condition and the severity of your symptoms. It also depends on your age. Underlying  causes need to be treated and controlled. These include long-lasting (chronic) conditions, such as diabetes, high cholesterol, and high blood pressure. You may need to first try making lifestyle changes and taking medicines. Surgery may be needed if these do not work. Lifestyle changes may include:  Quitting smoking.  Exercising regularly.  Following a low-fat, low-cholesterol diet. Medicines may include:  Blood thinners to prevent blood clots.  Medicines to improve blood flow.  Medicines to improve your blood cholesterol levels. Surgical procedures may include:  A procedure that uses an inflated balloon to open a blocked artery and improve blood flow (angioplasty).  A procedure to put in a tube (stent) to keep a blocked artery open (stent implant).  Surgery to reroute blood flow around a blocked artery (peripheral bypass surgery).  Surgery to remove dead tissue from an infected wound on the affected limb.  Amputation. This is surgical removal of the affected limb. This may be necessary in cases of acute ischemic limb that are not improved through medical or surgical treatments. HOME CARE INSTRUCTIONS  Take medicines only as directed by your health care provider.  Do not use any tobacco products, including cigarettes, chewing tobacco, or electronic cigarettes. If you need help quitting, ask your health care provider.  Lose weight if you are overweight, and maintain a healthy weight as directed by your health care provider.  Eat a diet that is low in fat and cholesterol. If you need help, ask your health care provider.  Exercise regularly. Ask your health care provider to suggest some good activities for you.  Use compression stockings or other mechanical devices as directed by your health care provider.  Take good care of your feet.  Wear comfortable shoes that fit well.  Check your feet often for any cuts or sores. SEEK MEDICAL CARE IF:  You have cramps in your legs  while walking.  You have leg pain when you are at rest.  You have coldness in a leg or foot.  Your skin changes.  You have erectile dysfunction.  You have cuts or sores on your feet that are not healing. SEEK IMMEDIATE MEDICAL CARE IF:  Your arm or leg turns cold and blue.  Your arms or legs become red, warm, swollen, painful, or numb.  You have chest pain or trouble breathing.  You suddenly have weakness in your face, arm, or leg.  You become very confused or lose the ability to speak.  You suddenly have a very bad headache or lose your vision.   This information is not intended to replace advice given to you by your health care provider. Make sure you discuss any questions you have with your health care provider.   Document Released: 12/06/2004 Document Revised: 11/19/2014 Document Reviewed: 04/08/2014 Elsevier Interactive Patient Education 2016 [employer]  

## 2015-09-29 ENCOUNTER — Encounter (HOSPITAL_COMMUNITY): Payer: Medicare Other

## 2015-10-05 ENCOUNTER — Encounter (HOSPITAL_COMMUNITY): Payer: Self-pay

## 2015-10-05 ENCOUNTER — Encounter (HOSPITAL_COMMUNITY)
Admission: RE | Admit: 2015-10-05 | Discharge: 2015-10-05 | Disposition: A | Discharge status: Home / Self Care | Payer: Medicare Other | Source: Ambulatory Visit | Attending: Nephrology | Admitting: Nephrology

## 2015-10-05 ENCOUNTER — Ambulatory Visit: Payer: Self-pay | Admitting: Family

## 2015-10-05 ENCOUNTER — Other Ambulatory Visit (HOSPITAL_COMMUNITY): Payer: Self-pay

## 2015-10-05 DIAGNOSIS — D631 Anemia in chronic kidney disease: Secondary | ICD-10-CM | POA: Diagnosis not present

## 2015-10-05 LAB — IRON AND TIBC
IRON: 72 ug/dL (ref 45–182)
SATURATION RATIOS: 27 % (ref 17.9–39.5)
TIBC: 269 ug/dL (ref 250–450)
UIBC: 197 ug/dL

## 2015-10-05 LAB — FERRITIN: Ferritin: 246 ng/mL (ref 24–336)

## 2015-10-05 MED ORDER — DARBEPOETIN ALFA 60 MCG/0.3ML IJ SOSY
60.0000 ug | PREFILLED_SYRINGE | INTRAMUSCULAR | Status: DC
  Administered 2015-10-05: 60 ug via SUBCUTANEOUS

## 2015-10-05 MED ORDER — DARBEPOETIN ALFA 60 MCG/0.3ML IJ SOSY
PREFILLED_SYRINGE | INTRAMUSCULAR | Status: AC
  Filled 2015-10-05: qty 0.3

## 2015-10-07 LAB — POCT HEMOGLOBIN-HEMACUE: Hemoglobin: 11.9 g/dL — ABNORMAL LOW (ref 13.0–17.0)

## 2015-10-13 ENCOUNTER — Encounter (HOSPITAL_COMMUNITY)
Admission: RE | Admit: 2015-10-13 | Discharge: 2015-10-13 | Disposition: A | Discharge status: Home / Self Care | Payer: Medicare Other | Source: Ambulatory Visit | Attending: Nephrology | Admitting: Nephrology

## 2015-10-13 DIAGNOSIS — N183 Chronic kidney disease, stage 3 (moderate): Secondary | ICD-10-CM | POA: Diagnosis not present

## 2015-10-13 DIAGNOSIS — D631 Anemia in chronic kidney disease: Secondary | ICD-10-CM | POA: Insufficient documentation

## 2015-10-13 LAB — POCT HEMOGLOBIN-HEMACUE: HEMOGLOBIN: 11.7 g/dL — AB (ref 13.0–17.0)

## 2015-10-13 MED ORDER — DARBEPOETIN ALFA 60 MCG/0.3ML IJ SOSY: 60.0000 ug | PREFILLED_SYRINGE | INTRAMUSCULAR | Status: DC

## 2015-10-13 MED ORDER — DARBEPOETIN ALFA 60 MCG/0.3ML IJ SOSY
PREFILLED_SYRINGE | INTRAMUSCULAR | Status: AC
  Administered 2015-10-13: 60 ug
  Filled 2015-10-13: qty 0.3

## 2015-10-20 ENCOUNTER — Encounter (HOSPITAL_COMMUNITY)
Admission: RE | Admit: 2015-10-20 | Discharge: 2015-10-20 | Disposition: A | Discharge status: Home / Self Care | Payer: Medicare Other | Source: Ambulatory Visit | Attending: Nephrology | Admitting: Nephrology

## 2015-10-20 DIAGNOSIS — D631 Anemia in chronic kidney disease: Secondary | ICD-10-CM | POA: Diagnosis not present

## 2015-10-20 LAB — POCT HEMOGLOBIN-HEMACUE: Hemoglobin: 12.3 g/dL — ABNORMAL LOW (ref 13.0–17.0)

## 2015-10-20 MED ORDER — DARBEPOETIN ALFA 60 MCG/0.3ML IJ SOSY: 60.0000 ug | PREFILLED_SYRINGE | INTRAMUSCULAR | Status: DC

## 2015-10-28 ENCOUNTER — Encounter: Payer: Self-pay | Admitting: Family

## 2015-11-03 ENCOUNTER — Encounter (HOSPITAL_COMMUNITY)
Admission: RE | Admit: 2015-11-03 | Discharge: 2015-11-03 | Disposition: A | Discharge status: Home / Self Care | Payer: Medicare Other | Source: Ambulatory Visit | Attending: Nephrology | Admitting: Nephrology

## 2015-11-03 DIAGNOSIS — D631 Anemia in chronic kidney disease: Secondary | ICD-10-CM | POA: Diagnosis not present

## 2015-11-03 LAB — FERRITIN: Ferritin: 220 ng/mL (ref 24–336)

## 2015-11-03 LAB — POCT HEMOGLOBIN-HEMACUE: Hemoglobin: 11.9 g/dL — ABNORMAL LOW (ref 13.0–17.0)

## 2015-11-03 LAB — IRON AND TIBC
Iron: 100 ug/dL (ref 45–182)
SATURATION RATIOS: 38 % (ref 17.9–39.5)
TIBC: 262 ug/dL (ref 250–450)
UIBC: 162 ug/dL

## 2015-11-03 MED ORDER — DARBEPOETIN ALFA 60 MCG/0.3ML IJ SOSY
60.0000 ug | PREFILLED_SYRINGE | INTRAMUSCULAR | Status: DC
  Administered 2015-11-03: 60 ug via SUBCUTANEOUS

## 2015-11-03 MED ORDER — DARBEPOETIN ALFA 60 MCG/0.3ML IJ SOSY
PREFILLED_SYRINGE | INTRAMUSCULAR | Status: AC
  Filled 2015-11-03: qty 0.3

## 2015-11-04 ENCOUNTER — Ambulatory Visit (INDEPENDENT_AMBULATORY_CARE_PROVIDER_SITE_OTHER)
Admission: RE | Admit: 2015-11-04 | Discharge: 2015-11-04 | Disposition: A | Discharge status: Home / Self Care | Payer: Medicare Other | Source: Ambulatory Visit | Attending: Family | Admitting: Family

## 2015-11-04 ENCOUNTER — Ambulatory Visit (HOSPITAL_COMMUNITY)
Admission: RE | Admit: 2015-11-04 | Discharge: 2015-11-04 | Disposition: A | Discharge status: Home / Self Care | Payer: Medicare Other | Source: Ambulatory Visit | Attending: Family | Admitting: Family

## 2015-11-04 ENCOUNTER — Ambulatory Visit (INDEPENDENT_AMBULATORY_CARE_PROVIDER_SITE_OTHER): Payer: Medicare Other | Admitting: Family

## 2015-11-04 ENCOUNTER — Other Ambulatory Visit: Payer: Self-pay | Admitting: Family

## 2015-11-04 ENCOUNTER — Other Ambulatory Visit: Payer: Self-pay

## 2015-11-04 ENCOUNTER — Encounter: Payer: Self-pay | Admitting: Family

## 2015-11-04 VITALS — BP 146/64 | HR 60 | Temp 97.1°F | Resp 16 | Ht 72.0 in | Wt 245.0 lb

## 2015-11-04 DIAGNOSIS — I739 Peripheral vascular disease, unspecified: Secondary | ICD-10-CM

## 2015-11-04 DIAGNOSIS — R938 Abnormal findings on diagnostic imaging of other specified body structures: Secondary | ICD-10-CM | POA: Insufficient documentation

## 2015-11-04 DIAGNOSIS — Z95828 Presence of other vascular implants and grafts: Secondary | ICD-10-CM

## 2015-11-04 DIAGNOSIS — I779 Disorder of arteries and arterioles, unspecified: Secondary | ICD-10-CM

## 2015-11-04 DIAGNOSIS — I129 Hypertensive chronic kidney disease with stage 1 through stage 4 chronic kidney disease, or unspecified chronic kidney disease: Secondary | ICD-10-CM | POA: Insufficient documentation

## 2015-11-04 DIAGNOSIS — I70201 Unspecified atherosclerosis of native arteries of extremities, right leg: Secondary | ICD-10-CM | POA: Insufficient documentation

## 2015-11-04 DIAGNOSIS — I70229 Atherosclerosis of native arteries of extremities with rest pain, unspecified extremity: Secondary | ICD-10-CM

## 2015-11-04 DIAGNOSIS — Z87891 Personal history of nicotine dependence: Secondary | ICD-10-CM

## 2015-11-04 DIAGNOSIS — N189 Chronic kidney disease, unspecified: Secondary | ICD-10-CM | POA: Diagnosis not present

## 2015-11-04 DIAGNOSIS — E119 Type 2 diabetes mellitus without complications: Secondary | ICD-10-CM | POA: Insufficient documentation

## 2015-11-04 DIAGNOSIS — E785 Hyperlipidemia, unspecified: Secondary | ICD-10-CM | POA: Diagnosis not present

## 2015-11-04 DIAGNOSIS — I998 Other disorder of circulatory system: Secondary | ICD-10-CM | POA: Diagnosis not present

## 2015-11-04 DIAGNOSIS — E1151 Type 2 diabetes mellitus with diabetic peripheral angiopathy without gangrene: Secondary | ICD-10-CM | POA: Diagnosis not present

## 2015-11-04 NOTE — Progress Notes (Signed)
Filed Vitals:   11/04/15 1303 11/04/15 1313  BP: 158/64 146/64  Pulse: 61 60  Temp: 97.1 F (36.2 C)   TempSrc: Oral   Resp: 16   Height: 6' (1.829 m)   Weight: 245 lb (111.131 kg)   SpO2: 98%

## 2015-11-04 NOTE — Progress Notes (Signed)
[facility] [place] HISTORY AND PHYSICAL -PAD  History of Present Illness Alexander [name] is a 54 y.o. male patient of Dr. Scot Dock who had initially presented with extensive gangrene of his left foot. He underwent a fifth toe amputation which failed to heal. He had multilevel arterial occlusive disease. On 10/21/2013, he underwent a left common femoral artery to tibial peroneal trunk bypass with a vein graft and Ray amputation of his left fourth toe. He ultimately required a transmetatarsal amputation on the left (Dr Berenice Primas). He comes in today a month ahead of schedule at the request of Dr. [name] to evaluate black spot on the tip of his right second toe that his wife first noticed a few days ago. He denies any known injury or stubbing his toe. He denies pain in his toe or foot.  He sees Dr. Justin Mend at [facility] for CKD, states his kidney function is improving.  The patient denies claudication symptoms with walking, reports non healing wound: same size black spot at the tip of his right second toe. He has been walking more, yard work,  The patient denies any history of stroke, TIA, or MI.  The patient New Medical or Surgical History: had syncopal episodes, found that he had several seconds of no heartbeats, pacemaker placed in November 2015, he also had a heart cath at that time, pt states no problems found.  Pt Diabetic: Yes, states better controlled, Review of records: A1C March 2016 was 7.8; pt states his fasting blood sugars lately have been up to 300, is working with his PCP to get his blood sugar under better control. Wife states A1C about August 2016 was 9.? Pt smoker: former smoker (cigars, was 10 small cigars/day, states he did not inhale, started at age 34 yrs, quit November 2015 after his syncopal episodes)  Pt meds include: Statin :Yes Betablocker: Yes ASA: Yes Other anticoagulants/antiplatelets: Eliqiuis since atrial fib history in November  2015    Past Medical History  Diagnosis Date  . Hypertension   . Arthritis   . Diabetes mellitus     dx---been  awhile  . Anemia   . Alcohol abuse   . Chronic kidney disease   . Hyperlipemia   . Cataract     BILATERAL  . Cardiac syncope 10/07/14    rapid A fib with 8 sec pauses on converison with syncope- temp pacing wire placed then PPM  . Atrial fibrillation ([place])   . Coronary artery disease   . Presence of permanent cardiac pacemaker 10/08/2014  . Peripheral vascular disease [facility])     Social History Social History  Substance Use Topics  . Smoking status: Former Smoker -- 1.50 packs/day for 30 years    Types: Cigars    Quit date: 10/07/2014  . Smokeless tobacco: Never Used  . Alcohol Use: 21.0 oz/week    35 Cans of beer per week     Comment: 6 of 12 oz beers a day    Family History Family History  Problem Relation Age of Onset  . Diabetes type II Mother   . Hypertension Mother   . Diabetes Mother   . Liver cancer Father   . Diabetes type II Sister   . Breast cancer Sister   . Diabetes Sister   . Hypertension Sister   . Diabetes type II Brother   . Kidney failure Brother   . Diabetes Brother   . Hypertension Brother   . Diabetes type II Sister  Past Surgical History  Procedure Laterality Date  . Lumbar laminectomy  06/13/2012    Procedure: MICRODISCECTOMY LUMBAR LAMINECTOMY;  Surgeon: Jessy Oto, MD;  Location: [place];  Service: Orthopedics;  Laterality: N/A;  Central laminectomy L2-3, L3-4, L4-5  . Amputation Left 08/19/2013    Procedure: AMPUTATION RAY;  Surgeon: Alta Corning, MD;  Location: [place];  Service: Orthopedics;  Laterality: Left;  ray amputation left 5th  . Femoral-tibial bypass graft Left 10/27/2013    Procedure: BYPASS GRAFT LEFT FEMORAL- POSTERIOR TIBIAL ARTERY;  Surgeon: Angelia Mould, MD;  Location: [place];  Service: Vascular;  Laterality: Left;  . I&d extremity Left 10/27/2013    Procedure: IRRIGATION AND DEBRIDEMENT  EXTREMITY- LEFT FOOT;  Surgeon: Angelia Mould, MD;  Location: [place];  Service: Vascular;  Laterality: Left;  . Amputation Left 10/27/2013    Procedure: AMPUTATION DIGIT-LEFT 4TH TOE, 4th and 5th metatarsal.;  Surgeon: Angelia Mould, MD;  Location: [place];  Service: Vascular;  Laterality: Left;  . Amputation Left 11/04/2013    Procedure: LEFT FOOT TRANS-METATARSAL AMPUTATION WITH WOUND CLOSURE ;  Surgeon: Alta Corning, MD;  Location: [place];  Service: Orthopedics;  Laterality: Left;  . Pacemaker insertion  10/08/14    MDT Advisa MRI compatible dual chamber pacemaker implanted by Dr Caryl Comes for syncope with post-termination pauses  . Temporary pacemaker insertion Bilateral 10/07/2014    Procedure: TEMPORARY PACEMAKER INSERTION;  Surgeon: Laverda Page, MD;  Location: [facility] CATH LAB;  Service: Cardiovascular;  Laterality: Bilateral;  . Cardiac catheterization  10/07/2014    Procedure: LEFT HEART CATH AND CORONARY ANGIOGRAPHY;  Surgeon: Laverda Page, MD;  Location: [facility] CATH LAB;  Service: Cardiovascular;;  . Permanent pacemaker insertion N/A 10/08/2014    Procedure: PERMANENT PACEMAKER INSERTION;  Surgeon: Deboraha Sprang, MD;  Location: [facility] CATH LAB;  Service: Cardiovascular;  Laterality: N/A;  . Percutaneous coronary stent intervention (pci-s)  11/09/2014    des to lad & distal circumflex         Dr  [name]  . Left heart catheterization with coronary angiogram N/A 11/09/2014    Procedure: LEFT HEART CATHETERIZATION WITH CORONARY ANGIOGRAM;  Surgeon: Laverda Page, MD;  Location: [facility] CATH LAB;  Service: Cardiovascular;  Laterality: N/A;    Allergies  Allergen Reactions  . Sulfa Antibiotics Rash    Current Outpatient Prescriptions  Medication Sig Dispense Refill  . ACCU-CHEK FASTCLIX LANCETS MISC Check blood sugar TID & QHS 102 each 2  . amiodarone (PACERONE) 400 MG tablet Take 0.5 tablets (200 mg total) by mouth daily. 60 tablet 0  . amLODipine (NORVASC) 10 MG tablet Take 1  tablet (10 mg total) by mouth daily. 30 tablet 2  . apixaban (ELIQUIS) 5 MG TABS tablet Take 1 tablet (5 mg total) by mouth 2 (two) times daily. 180 tablet 3  . aspirin 81 MG tablet Take 81 mg by mouth daily.    Marland Kitchen atorvastatin (LIPITOR) 10 MG tablet TAKE 1 TABLET BY MOUTH DAILY. 30 tablet 0  . atorvastatin (LIPITOR) 10 MG tablet Take 1 tablet (10 mg total) by mouth daily. (Patient taking differently: Take 20 mg by mouth daily. ) 30 tablet 2  . atorvastatin (LIPITOR) 20 MG tablet Take 20 mg by mouth daily.  5  . blood glucose meter kit and supplies KIT Check blood sugar TID & QHS 1 each 0  . Blood Glucose Monitoring Suppl (ACCU-CHEK ADVANTAGE DIABETES) kit Use as instructed 1 each 0  .  chlorthalidone (HYGROTON) 25 MG tablet Take 25 mg by mouth daily. Take Two tablets daily    . clopidogrel (PLAVIX) 75 MG tablet Take 1 tablet (75 mg total) by mouth daily with breakfast. 60 tablet 6  . furosemide (LASIX) 40 MG tablet Take 1 tablet (40 mg total) by mouth every morning. 30 tablet 2  . GlucoCom Lancets MISC Check blood sugar TID & QHS 100 each 0  . glucose blood (ACCU-CHEK AVIVA PLUS) test strip Use as instructed 100 each 12  . glucose blood (CHOICE DM FORA G20 TEST STRIPS) test strip Check blood sugar TID & QHS 100 each 12  . glucose blood test strip Use as instructed 100 each 12  . HUMALOG MIX 75/25 KWIKPEN (75-25) 100 UNIT/ML Kwikpen   0  . hydrALAZINE (APRESOLINE) 100 MG tablet TAKE ONE TABLET BY MOUTH THREE TIMES DAILY 90 tablet 1  . metoprolol (LOPRESSOR) 100 MG tablet Take 0.5 tablets (50 mg total) by mouth 2 (two) times daily. 60 tablet 3  . Multiple Vitamins-Minerals (MULTIVITAMINS THER. W/MINERALS) TABS Take 1 tablet by mouth daily. Centrum Silver    . glipiZIDE (GLUCOTROL) 10 MG tablet TAKE 1 TABLET BY MOUTH 2 TIMES DAILY. (Patient not taking: Reported on 11/04/2015) 60 tablet 3  . sitaGLIPtin (JANUVIA) 25 MG tablet Take 1 tablet (25 mg total) by mouth daily. (Patient not taking: Reported on  11/04/2015) 90 tablet 3  . TRESIBA FLEXTOUCH 100 UNIT/ML SOPN daily. Reported on 11/04/2015  0   No current facility-administered medications for this visit.    ROS: See HPI for pertinent positives and negatives.   Physical Examination  Filed Vitals:   11/04/15 1303  BP: 158/64  Pulse: 61  Temp: 97.1 F (36.2 C)  TempSrc: Oral  Resp: 16  Height: 6' (1.829 m)  Weight: 245 lb (111.131 kg)  SpO2: 98%   Body mass index is 33.22 kg/(m^2).  General: A&O x 3, WDWN, obese male. Gait: normal Eyes: PERRLA. Pulmonary: CTAB, without wheezes , rales or rhonchi. Cardiac: regular rhythm, no detected murmur. Pacemaker palpated subcutaneously.    Carotid Bruits Right Left   Negative Negative  Aorta is not palpable. Radial pulses: 2+ palpable and =   VASCULAR EXAM: Extremities with ischemic changes, left lower foot is surgically absent; without open wounds. Small black dry area at the tip of right 2nd toe,  well circumscribed, no drainage. Right foot with dependent rubor.      LE Pulses Right Left   FEMORAL 2+ palpable 3+ palpable    POPLITEAL not palpable   Palpable medial graft   POSTERIOR TIBIAL not palpable, audible Doppler signal  not palpable, audible Doppler signal    DORSALIS PEDIS  ANTERIOR TIBIAL Not palpable, audible Doppler signal  2+ palpable    Abdomen: soft, NT, no palpable masses. Skin: no rashes, see Extremities. Musculoskeletal: no muscle wasting or atrophy, left lower foot is surgically absent. Neurologic: A&O X 3; Appropriate Affect ; SENSATION: normal; MOTOR FUNCTION: moving all extremities equally, motor strength 5/5 throughout. Speech is fluent/normal. CN 2-12 intact.                      Non-Invasive Vascular Imaging: DATE: 11/04/2015 LOWER EXTREMITY ARTERIAL EVALUATION    INDICATION: Peripheral vascular disease     PREVIOUS INTERVENTION(S): Left femoral tibial artery bypass graft with left ray amputation 10/27/2013.    DUPLEX EXAM: Duplex evaluation of the lower extremity arterial system to include the common femoral, superficial femoral, popliteal, and tibial  arteries and bypass graft(s) and/or stent(s) if present.      FINDINGS:                                       RIGHT LOWER EXTREMITY:  Heterogeneous plaque present throughout the lower extremity with hemodynamically significant stenosis present at the distal superficial femoral artery.    LEFT LOWER EXTREMITY:  Patent femoral-tibial artery bypass graft.      See the attached diagram for velocities.  IMPRESSION:  Right distal superficial femoral artery stenosis present in the 50%-74% range, which may be underestimated due to plaque and location difficult to interrogate. No color or spectral Doppler waveform could be obtained involving the left peroneal artery suggestive of vessel occlusion. Patent left femoral to tibial artery bypass graft, no hemodynamically significant stenosis. Decreased right and stable left ankle brachial indices since previous study on 03/30/2015.    ANKLE/BRACHIAL INDEX - Right = 0.27                     Left =   0.96         (Please see complete report)                   ASSESSMENT: Alexander [name] is a 54 y.o. male who is s/p left femoral tibial artery bypass graft with left ray amputation 10/27/2013.  Today's bilateral LE arterial duplex suggests right distal superficial femoral artery stenosis present in the 50%-74% range, which may be underestimated due to plaque and location difficult to interrogate. No color or spectral Doppler waveform could be obtained involving the left peroneal artery suggestive of vessel occlusion. Patent left femoral to tibial  artery bypass graft, no hemodynamically significant stenosis. Decreased right and stable left ankle brachial indices since previous study on 03/30/2015. Critical ischemia of the right LE: right ABI today of 0.27 compared to 0.79 on 03/30/15, all monophasic waveforms. Left ABI of 0.96 with all triphasic waveforms, compared to 1.07 on 03/30/15.  He quit tobacco use in November. His DM is uncontrolled.  He does not seem to have claudication sx's in his right leg, he does seem to have mild/moderate pain in his left foot. He has dependent rubor in his left foot and a dry black area at the tip of his right second toe.   Last creatinine result on file was 1.52 in March of 2016.  Face to face time with patient was 25 minutes. Over 50% of this time was spent on counseling and coordination of care.  PLAN:  Based on the patient's vascular studies and examination, and after discussing with Dr. Donnetta Hutching, will schedule pt for outpt arteriogram with bilateral run off, possible intervention, soonest available vascular surgeon.  I discussed in depth with the patient the nature of atherosclerosis, and emphasized the importance of maximal medical management including strict control of blood pressure, blood glucose, and lipid levels, obtaining regular exercise, and continued cessation of smoking.  The patient is aware that without maximal medical management the underlying atherosclerotic disease process will progress, limiting the benefit of any interventions.  The patient was given information about PAD including signs, symptoms, treatment, what symptoms should prompt the patient to seek immediate medical care, and risk reduction measures to take.  Clemon Chambers, RN, MSN, FNP-C [facility] Phone: [phone]  Clinic MD: Early on call  11/04/2015 1:15 PM

## 2015-11-04 NOTE — Patient Instructions (Signed)
Peripheral Vascular Disease Peripheral vascular disease (PVD) is a disease of the blood vessels that are not part of your heart and brain. A simple term for PVD is poor circulation. In most cases, PVD narrows the blood vessels that carry blood from your heart to the rest of your body. This can result in a decreased supply of blood to your arms, legs, and internal organs, like your stomach or kidneys. However, it most often affects a person's lower legs and feet. There are two types of PVD.  Organic PVD. This is the more common type. It is caused by damage to the structure of blood vessels.  Functional PVD. This is caused by conditions that make blood vessels contract and tighten (spasm). Without treatment, PVD tends to get worse over time. PVD can also lead to acute ischemic limb. This is when an arm or limb suddenly has trouble getting enough blood. This is a medical emergency. CAUSES Each type of PVD has many different causes. The most common cause of PVD is buildup of a fatty material (plaque) inside of your arteries (atherosclerosis). Small amounts of plaque can break off from the walls of the blood vessels and become lodged in a smaller artery. This blocks blood flow and can cause acute ischemic limb. Other common causes of PVD include:  Blood clots that form inside of blood vessels.  Injuries to blood vessels.  Diseases that cause inflammation of blood vessels or cause blood vessel spasms.  Health behaviors and health history that increase your risk of developing PVD. RISK FACTORS  You may have a greater risk of PVD if you:  Have a family history of PVD.  Have certain medical conditions, including:  High cholesterol.  Diabetes.  High blood pressure (hypertension).  Coronary heart disease.  Past problems with blood clots.  Past injury, such as burns or a broken bone. These may have damaged blood vessels in your limbs.  Buerger disease. This is caused by inflamed blood  vessels in your hands and feet.  Some forms of arthritis.  Rare birth defects that affect the arteries in your legs.  Use tobacco.  Do not get enough exercise.  Are obese.  Are age 50 or older. SIGNS AND SYMPTOMS  PVD may cause many different symptoms. Your symptoms depend on what part of your body is not getting enough blood. Some common signs and symptoms include:  Cramps in your lower legs. This may be a symptom of poor leg circulation (claudication).  Pain and weakness in your legs while you are physically active that goes away when you rest (intermittent claudication).  Leg pain when at rest.  Leg numbness, tingling, or weakness.  Coldness in a leg or foot, especially when compared with the other leg.  Skin or hair changes. These can include:  Hair loss.  Shiny skin.  Pale or bluish skin.  Thick toenails.  Inability to get or maintain an erection (erectile dysfunction). People with PVD are more prone to developing ulcers and sores on their toes, feet, or legs. These may take longer than normal to heal. DIAGNOSIS Your health care provider may diagnose PVD from your signs and symptoms. The health care provider will also do a physical exam. You may have tests to find out what is causing your PVD and determine its severity. Tests may include:  Blood pressure recordings from your arms and legs and measurements of the strength of your pulses (pulse volume recordings).  Imaging studies using sound waves to take pictures of   the blood flow through your blood vessels (Doppler ultrasound).  Injecting a dye into your blood vessels before having imaging studies using:  X-rays (angiogram or arteriogram).  Computer-generated X-rays (CT angiogram).  A powerful electromagnetic field and a computer (magnetic resonance angiogram or MRA). TREATMENT Treatment for PVD depends on the cause of your condition and the severity of your symptoms. It also depends on your age. Underlying  causes need to be treated and controlled. These include long-lasting (chronic) conditions, such as diabetes, high cholesterol, and high blood pressure. You may need to first try making lifestyle changes and taking medicines. Surgery may be needed if these do not work. Lifestyle changes may include:  Quitting smoking.  Exercising regularly.  Following a low-fat, low-cholesterol diet. Medicines may include:  Blood thinners to prevent blood clots.  Medicines to improve blood flow.  Medicines to improve your blood cholesterol levels. Surgical procedures may include:  A procedure that uses an inflated balloon to open a blocked artery and improve blood flow (angioplasty).  A procedure to put in a tube (stent) to keep a blocked artery open (stent implant).  Surgery to reroute blood flow around a blocked artery (peripheral bypass surgery).  Surgery to remove dead tissue from an infected wound on the affected limb.  Amputation. This is surgical removal of the affected limb. This may be necessary in cases of acute ischemic limb that are not improved through medical or surgical treatments. HOME CARE INSTRUCTIONS  Take medicines only as directed by your health care provider.  Do not use any tobacco products, including cigarettes, chewing tobacco, or electronic cigarettes. If you need help quitting, ask your health care provider.  Lose weight if you are overweight, and maintain a healthy weight as directed by your health care provider.  Eat a diet that is low in fat and cholesterol. If you need help, ask your health care provider.  Exercise regularly. Ask your health care provider to suggest some good activities for you.  Use compression stockings or other mechanical devices as directed by your health care provider.  Take good care of your feet.  Wear comfortable shoes that fit well.  Check your feet often for any cuts or sores. SEEK MEDICAL CARE IF:  You have cramps in your legs  while walking.  You have leg pain when you are at rest.  You have coldness in a leg or foot.  Your skin changes.  You have erectile dysfunction.  You have cuts or sores on your feet that are not healing. SEEK IMMEDIATE MEDICAL CARE IF:  Your arm or leg turns cold and blue.  Your arms or legs become red, warm, swollen, painful, or numb.  You have chest pain or trouble breathing.  You suddenly have weakness in your face, arm, or leg.  You become very confused or lose the ability to speak.  You suddenly have a very bad headache or lose your vision.   This information is not intended to replace advice given to you by your health care provider. Make sure you discuss any questions you have with your health care provider.   Document Released: 12/06/2004 Document Revised: 11/19/2014 Document Reviewed: 04/08/2014 Elsevier Interactive Patient Education 2016 [employer]  

## 2015-11-08 ENCOUNTER — Other Ambulatory Visit: Payer: Self-pay | Admitting: *Deleted

## 2015-11-08 ENCOUNTER — Ambulatory Visit (HOSPITAL_COMMUNITY)
Admission: RE | Admit: 2015-11-08 | Discharge: 2015-11-08 | Disposition: A | Discharge status: Home / Self Care | Payer: Medicare Other | Source: Ambulatory Visit | Attending: Surgery | Admitting: Surgery

## 2015-11-08 ENCOUNTER — Encounter (HOSPITAL_COMMUNITY)
Admission: RE | Disposition: A | Discharge status: Home / Self Care | Payer: Self-pay | Source: Ambulatory Visit | Attending: Surgery

## 2015-11-08 DIAGNOSIS — Z950 Presence of cardiac pacemaker: Secondary | ICD-10-CM | POA: Diagnosis not present

## 2015-11-08 DIAGNOSIS — Z7982 Long term (current) use of aspirin: Secondary | ICD-10-CM | POA: Insufficient documentation

## 2015-11-08 DIAGNOSIS — Z89422 Acquired absence of other left toe(s): Secondary | ICD-10-CM | POA: Insufficient documentation

## 2015-11-08 DIAGNOSIS — N189 Chronic kidney disease, unspecified: Secondary | ICD-10-CM | POA: Diagnosis not present

## 2015-11-08 DIAGNOSIS — E1151 Type 2 diabetes mellitus with diabetic peripheral angiopathy without gangrene: Secondary | ICD-10-CM | POA: Insufficient documentation

## 2015-11-08 DIAGNOSIS — Z794 Long term (current) use of insulin: Secondary | ICD-10-CM | POA: Insufficient documentation

## 2015-11-08 DIAGNOSIS — I70235 Atherosclerosis of native arteries of right leg with ulceration of other part of foot: Secondary | ICD-10-CM

## 2015-11-08 DIAGNOSIS — M1990 Unspecified osteoarthritis, unspecified site: Secondary | ICD-10-CM | POA: Insufficient documentation

## 2015-11-08 DIAGNOSIS — Z8249 Family history of ischemic heart disease and other diseases of the circulatory system: Secondary | ICD-10-CM | POA: Diagnosis not present

## 2015-11-08 DIAGNOSIS — I4891 Unspecified atrial fibrillation: Secondary | ICD-10-CM | POA: Insufficient documentation

## 2015-11-08 DIAGNOSIS — E1136 Type 2 diabetes mellitus with diabetic cataract: Secondary | ICD-10-CM | POA: Diagnosis not present

## 2015-11-08 DIAGNOSIS — E1165 Type 2 diabetes mellitus with hyperglycemia: Secondary | ICD-10-CM | POA: Insufficient documentation

## 2015-11-08 DIAGNOSIS — I2510 Atherosclerotic heart disease of native coronary artery without angina pectoris: Secondary | ICD-10-CM | POA: Insufficient documentation

## 2015-11-08 DIAGNOSIS — Z7901 Long term (current) use of anticoagulants: Secondary | ICD-10-CM | POA: Insufficient documentation

## 2015-11-08 DIAGNOSIS — L97519 Non-pressure chronic ulcer of other part of right foot with unspecified severity: Secondary | ICD-10-CM | POA: Diagnosis not present

## 2015-11-08 DIAGNOSIS — Z882 Allergy status to sulfonamides status: Secondary | ICD-10-CM | POA: Diagnosis not present

## 2015-11-08 DIAGNOSIS — I739 Peripheral vascular disease, unspecified: Secondary | ICD-10-CM

## 2015-11-08 DIAGNOSIS — E785 Hyperlipidemia, unspecified: Secondary | ICD-10-CM | POA: Insufficient documentation

## 2015-11-08 DIAGNOSIS — Z7902 Long term (current) use of antithrombotics/antiplatelets: Secondary | ICD-10-CM | POA: Insufficient documentation

## 2015-11-08 DIAGNOSIS — F1010 Alcohol abuse, uncomplicated: Secondary | ICD-10-CM | POA: Diagnosis not present

## 2015-11-08 DIAGNOSIS — Z9862 Peripheral vascular angioplasty status: Secondary | ICD-10-CM

## 2015-11-08 DIAGNOSIS — I129 Hypertensive chronic kidney disease with stage 1 through stage 4 chronic kidney disease, or unspecified chronic kidney disease: Secondary | ICD-10-CM | POA: Diagnosis not present

## 2015-11-08 DIAGNOSIS — Z87891 Personal history of nicotine dependence: Secondary | ICD-10-CM | POA: Diagnosis not present

## 2015-11-08 DIAGNOSIS — I7092 Chronic total occlusion of artery of the extremities: Secondary | ICD-10-CM | POA: Insufficient documentation

## 2015-11-08 DIAGNOSIS — E1122 Type 2 diabetes mellitus with diabetic chronic kidney disease: Secondary | ICD-10-CM | POA: Diagnosis not present

## 2015-11-08 DIAGNOSIS — D649 Anemia, unspecified: Secondary | ICD-10-CM | POA: Diagnosis not present

## 2015-11-08 HISTORY — PX: PERIPHERAL VASCULAR CATHETERIZATION: SHX172C

## 2015-11-08 HISTORY — PX: LOWER EXTREMITY ANGIOGRAM: SHX5508

## 2015-11-08 LAB — POCT ACTIVATED CLOTTING TIME
Activated Clotting Time: 234 seconds
Activated Clotting Time: 240 seconds
Activated Clotting Time: 193 seconds
Activated Clotting Time: 183 seconds

## 2015-11-08 LAB — POCT I-STAT, CHEM 8
BUN: 20 mg/dL (ref 6–20)
CHLORIDE: 101 mmol/L (ref 101–111)
CREATININE: 1.2 mg/dL (ref 0.61–1.24)
Calcium, Ion: 1.17 mmol/L (ref 1.12–1.23)
Glucose, Bld: 345 mg/dL — ABNORMAL HIGH (ref 65–99)
HEMATOCRIT: 36 % — AB (ref 39.0–52.0)
Hemoglobin: 12.2 g/dL — ABNORMAL LOW (ref 13.0–17.0)
Potassium: 3.7 mmol/L (ref 3.5–5.1)
SODIUM: 137 mmol/L (ref 135–145)
TCO2: 23 mmol/L (ref 0–100)

## 2015-11-08 LAB — GLUCOSE, CAPILLARY
GLUCOSE-CAPILLARY: 275 mg/dL — AB (ref 65–99)
Glucose-Capillary: 315 mg/dL — ABNORMAL HIGH (ref 65–99)

## 2015-11-08 SURGERY — ABDOMINAL AORTOGRAM
Anesthesia: LOCAL | Laterality: Right

## 2015-11-08 MED ORDER — ONDANSETRON HCL 4 MG/2ML IJ SOLN: 4.0000 mg | Freq: Four times a day (QID) | INTRAMUSCULAR | Status: DC | PRN

## 2015-11-08 MED ORDER — HYDRALAZINE HCL 20 MG/ML IJ SOLN
5.0000 mg | INTRAMUSCULAR | Status: DC | PRN
Start: 2015-11-08 — End: 2015-11-08

## 2015-11-08 MED ORDER — METOPROLOL TARTRATE 1 MG/ML IV SOLN: 2.0000 mg | INTRAVENOUS | Status: DC | PRN

## 2015-11-08 MED ORDER — OXYCODONE HCL 5 MG PO TABS: 5.0000 mg | ORAL_TABLET | ORAL | Status: DC | PRN

## 2015-11-08 MED ORDER — HEPARIN SODIUM (PORCINE) 1000 UNIT/ML IJ SOLN
INTRAMUSCULAR | Status: DC | PRN
  Administered 2015-11-08: 2000 [IU] via INTRAVENOUS
  Administered 2015-11-08: 10000 [IU] via INTRAVENOUS

## 2015-11-08 MED ORDER — IODIXANOL 320 MG/ML IV SOLN
INTRAVENOUS | Status: DC | PRN
  Administered 2015-11-08: 130 mL via INTRA_ARTERIAL

## 2015-11-08 MED ORDER — MORPHINE SULFATE (PF) 10 MG/ML IV SOLN: 2.0000 mg | INTRAVENOUS | Status: DC | PRN

## 2015-11-08 MED ORDER — INSULIN ASPART 100 UNIT/ML ~~LOC~~ SOLN
SUBCUTANEOUS | Status: AC
  Administered 2015-11-08: 10 [IU] via SUBCUTANEOUS
  Filled 2015-11-08: qty 1

## 2015-11-08 MED ORDER — INSULIN ASPART 100 UNIT/ML ~~LOC~~ SOLN
10.0000 [IU] | Freq: Once | SUBCUTANEOUS | Status: AC
  Administered 2015-11-08: 10 [IU] via SUBCUTANEOUS

## 2015-11-08 MED ORDER — FENTANYL CITRATE (PF) 100 MCG/2ML IJ SOLN
INTRAMUSCULAR | Status: AC
  Filled 2015-11-08: qty 2

## 2015-11-08 MED ORDER — HEPARIN SODIUM (PORCINE) 1000 UNIT/ML IJ SOLN
INTRAMUSCULAR | Status: AC
  Filled 2015-11-08: qty 1

## 2015-11-08 MED ORDER — VIPERSLIDE LUBRICANT OPTIME
TOPICAL | Status: DC | PRN
  Administered 2015-11-08: 13:00:00 via SURGICAL_CAVITY

## 2015-11-08 MED ORDER — NITROGLYCERIN IN D5W 200-5 MCG/ML-% IV SOLN
INTRAVENOUS | Status: AC
  Filled 2015-11-08: qty 250

## 2015-11-08 MED ORDER — MIDAZOLAM HCL 2 MG/2ML IJ SOLN
INTRAMUSCULAR | Status: AC
  Filled 2015-11-08: qty 2

## 2015-11-08 MED ORDER — SODIUM CHLORIDE 0.9 % IV SOLN
1.0000 mL/kg/h | INTRAVENOUS | Status: DC
Start: 2015-11-08 — End: 2015-11-08

## 2015-11-08 MED ORDER — PHENOL 1.4 % MT LIQD
1.0000 | OROMUCOSAL | Status: DC | PRN
Start: 2015-11-08 — End: 2015-11-08

## 2015-11-08 MED ORDER — LIDOCAINE HCL (PF) 1 % IJ SOLN
INTRAMUSCULAR | Status: DC | PRN
  Administered 2015-11-08: 13:00:00

## 2015-11-08 MED ORDER — SODIUM CHLORIDE 0.9 % IV SOLN
INTRAVENOUS | Status: DC
  Administered 2015-11-08: 11:00:00 via INTRAVENOUS

## 2015-11-08 MED ORDER — HEPARIN SODIUM (PORCINE) 1000 UNIT/ML IJ SOLN
INTRAMUSCULAR | Status: AC
Start: 2015-11-08 — End: 2015-11-08
  Filled 2015-11-08: qty 1

## 2015-11-08 MED ORDER — FENTANYL CITRATE (PF) 100 MCG/2ML IJ SOLN
INTRAMUSCULAR | Status: DC | PRN
  Administered 2015-11-08: 50 ug via INTRAVENOUS

## 2015-11-08 MED ORDER — MIDAZOLAM HCL 2 MG/2ML IJ SOLN
INTRAMUSCULAR | Status: DC | PRN
  Administered 2015-11-08: 2 mg via INTRAVENOUS

## 2015-11-08 MED ORDER — HEPARIN (PORCINE) IN NACL 2-0.9 UNIT/ML-% IJ SOLN
INTRAMUSCULAR | Status: AC
  Filled 2015-11-08: qty 1000

## 2015-11-08 MED ORDER — LIDOCAINE HCL (PF) 1 % IJ SOLN
INTRAMUSCULAR | Status: AC
  Filled 2015-11-08: qty 30

## 2015-11-08 MED ORDER — ACETAMINOPHEN 325 MG RE SUPP: 325.0000 mg | RECTAL | Status: DC | PRN

## 2015-11-08 MED ORDER — ACETAMINOPHEN 325 MG PO TABS: 325.0000 mg | ORAL_TABLET | ORAL | Status: DC | PRN

## 2015-11-08 MED ORDER — DOCUSATE SODIUM 100 MG PO CAPS: 100.0000 mg | ORAL_CAPSULE | Freq: Every day | ORAL | Status: DC

## 2015-11-08 MED ORDER — GUAIFENESIN-DM 100-10 MG/5ML PO SYRP: 15.0000 mL | ORAL_SOLUTION | ORAL | Status: DC | PRN

## 2015-11-08 MED ORDER — ALUM & MAG HYDROXIDE-SIMETH 200-200-20 MG/5ML PO SUSP: 15.0000 mL | ORAL | Status: DC | PRN

## 2015-11-08 MED ORDER — LABETALOL HCL 5 MG/ML IV SOLN: 10.0000 mg | INTRAVENOUS | Status: DC | PRN

## 2015-11-08 SURGICAL SUPPLY — 29 items
BAG SNAP BAND KOVER 36X36 (MISCELLANEOUS) ×3 IMPLANT
BALLN LUTONIX 5X120X130 (BALLOONS) ×3
BALLOON LUTONIX 5X120X130 (BALLOONS) ×2 IMPLANT
CATH OMNI FLUSH 5F 65CM (CATHETERS) ×3 IMPLANT
CATH QUICKCROSS .035X135CM (MICROCATHETER) ×3 IMPLANT
COVER DOME SNAP 22 D (MISCELLANEOUS) ×3 IMPLANT
COVER PRB 48X5XTLSCP FOLD TPE (BAG) ×2 IMPLANT
COVER PROBE 5X48 (BAG) ×1
DEVICE TORQUE H2O (MISCELLANEOUS) ×3 IMPLANT
DIAMONDBACK CLASSIC OAS 2.0MM (CATHETERS) ×3
DRAPE ZERO GRAVITY STERILE (DRAPES) ×3 IMPLANT
GUIDEWIRE .025 260CM (WIRE) IMPLANT
GUIDEWIRE ANGLED .035X260CM (WIRE) ×3 IMPLANT
KIT ENCORE 26 ADVANTAGE (KITS) ×3 IMPLANT
KIT MICROINTRODUCER STIFF 5F (SHEATH) ×3 IMPLANT
KIT PV (KITS) ×3 IMPLANT
LUBRICANT VIPERSLIDE CORONARY (MISCELLANEOUS) ×3 IMPLANT
SHEATH PINNACLE 5F 10CM (SHEATH) ×3 IMPLANT
SHEATH PINNACLE MP 7F 45CM (SHEATH) ×3 IMPLANT
SHEATH PINNACLE ST 7F 45CM (SHEATH) ×3 IMPLANT
SHIELD RADPAD SCOOP 12X17 (MISCELLANEOUS) ×3 IMPLANT
SYR MEDRAD MARK V 150ML (SYRINGE) ×3 IMPLANT
SYSTEM DIMODBCK CLSC OAS 2.0MM (CATHETERS) ×2 IMPLANT
TAPE RADIOPAQUE TURBO (MISCELLANEOUS) ×3 IMPLANT
TRANSDUCER W/STOPCOCK (MISCELLANEOUS) ×3 IMPLANT
TRAY PV CATH (CUSTOM PROCEDURE TRAY) ×3 IMPLANT
WIRE AMPLATZ SS-J .035X180CM (WIRE) ×3 IMPLANT
WIRE BENTSON .035X145CM (WIRE) ×3 IMPLANT
WIRE VIPER ADVANCE .017X335CM (WIRE) ×3 IMPLANT

## 2015-11-08 NOTE — Progress Notes (Signed)
Dr Trula Slade has been called x 2 for elevated glucose. Orders followed.

## 2015-11-08 NOTE — H&P (View-Only) (Signed)
[facility] [place] HISTORY AND PHYSICAL -PAD  History of Present Illness Alexander [name] is a 54 y.o. male patient of Dr. Scot Dock who had initially presented with extensive gangrene of his left foot. He underwent a fifth toe amputation which failed to heal. He had multilevel arterial occlusive disease. On 10/21/2013, he underwent a left common femoral artery to tibial peroneal trunk bypass with a vein graft and Ray amputation of his left fourth toe. He ultimately required a transmetatarsal amputation on the left (Dr Berenice Primas). He comes in today a month ahead of schedule at the request of Dr. [name] to evaluate black spot on the tip of his right second toe that his wife first noticed a few days ago. He denies any known injury or stubbing his toe. He denies pain in his toe or foot.  He sees Dr. Justin Mend at [facility] for CKD, states his kidney function is improving.  The patient denies claudication symptoms with walking, reports non healing wound: same size black spot at the tip of his right second toe. He has been walking more, yard work,  The patient denies any history of stroke, TIA, or MI.  The patient New Medical or Surgical History: had syncopal episodes, found that he had several seconds of no heartbeats, pacemaker placed in November 2015, he also had a heart cath at that time, pt states no problems found.  Pt Diabetic: Yes, states better controlled, Review of records: A1C March 2016 was 7.8; pt states his fasting blood sugars lately have been up to 300, is working with his PCP to get his blood sugar under better control. Wife states A1C about August 2016 was 9.? Pt smoker: former smoker (cigars, was 10 small cigars/day, states he did not inhale, started at age 80 yrs, quit November 2015 after his syncopal episodes)  Pt meds include: Statin :Yes Betablocker: Yes ASA: Yes Other anticoagulants/antiplatelets: Eliqiuis since atrial fib history in November  2015    Past Medical History  Diagnosis Date  . Hypertension   . Arthritis   . Diabetes mellitus     dx---been  awhile  . Anemia   . Alcohol abuse   . Chronic kidney disease   . Hyperlipemia   . Cataract     BILATERAL  . Cardiac syncope 10/07/14    rapid A fib with 8 sec pauses on converison with syncope- temp pacing wire placed then PPM  . Atrial fibrillation ([place])   . Coronary artery disease   . Presence of permanent cardiac pacemaker 10/08/2014  . Peripheral vascular disease [facility])     Social History Social History  Substance Use Topics  . Smoking status: Former Smoker -- 1.50 packs/day for 30 years    Types: Cigars    Quit date: 10/07/2014  . Smokeless tobacco: Never Used  . Alcohol Use: 21.0 oz/week    35 Cans of beer per week     Comment: 6 of 12 oz beers a day    Family History Family History  Problem Relation Age of Onset  . Diabetes type II Mother   . Hypertension Mother   . Diabetes Mother   . Liver cancer Father   . Diabetes type II Sister   . Breast cancer Sister   . Diabetes Sister   . Hypertension Sister   . Diabetes type II Brother   . Kidney failure Brother   . Diabetes Brother   . Hypertension Brother   . Diabetes type II Sister  Past Surgical History  Procedure Laterality Date  . Lumbar laminectomy  06/13/2012    Procedure: MICRODISCECTOMY LUMBAR LAMINECTOMY;  Surgeon: Jessy Oto, MD;  Location: [place];  Service: Orthopedics;  Laterality: N/A;  Central laminectomy L2-3, L3-4, L4-5  . Amputation Left 08/19/2013    Procedure: AMPUTATION RAY;  Surgeon: Alta Corning, MD;  Location: [place];  Service: Orthopedics;  Laterality: Left;  ray amputation left 5th  . Femoral-tibial bypass graft Left 10/27/2013    Procedure: BYPASS GRAFT LEFT FEMORAL- POSTERIOR TIBIAL ARTERY;  Surgeon: Angelia Mould, MD;  Location: [place];  Service: Vascular;  Laterality: Left;  . I&d extremity Left 10/27/2013    Procedure: IRRIGATION AND DEBRIDEMENT  EXTREMITY- LEFT FOOT;  Surgeon: Angelia Mould, MD;  Location: [place];  Service: Vascular;  Laterality: Left;  . Amputation Left 10/27/2013    Procedure: AMPUTATION DIGIT-LEFT 4TH TOE, 4th and 5th metatarsal.;  Surgeon: Angelia Mould, MD;  Location: [place];  Service: Vascular;  Laterality: Left;  . Amputation Left 11/04/2013    Procedure: LEFT FOOT TRANS-METATARSAL AMPUTATION WITH WOUND CLOSURE ;  Surgeon: Alta Corning, MD;  Location: [place];  Service: Orthopedics;  Laterality: Left;  . Pacemaker insertion  10/08/14    MDT Advisa MRI compatible dual chamber pacemaker implanted by Dr Caryl Comes for syncope with post-termination pauses  . Temporary pacemaker insertion Bilateral 10/07/2014    Procedure: TEMPORARY PACEMAKER INSERTION;  Surgeon: Laverda Page, MD;  Location: [facility] CATH LAB;  Service: Cardiovascular;  Laterality: Bilateral;  . Cardiac catheterization  10/07/2014    Procedure: LEFT HEART CATH AND CORONARY ANGIOGRAPHY;  Surgeon: Laverda Page, MD;  Location: [facility] CATH LAB;  Service: Cardiovascular;;  . Permanent pacemaker insertion N/A 10/08/2014    Procedure: PERMANENT PACEMAKER INSERTION;  Surgeon: Deboraha Sprang, MD;  Location: [facility] CATH LAB;  Service: Cardiovascular;  Laterality: N/A;  . Percutaneous coronary stent intervention (pci-s)  11/09/2014    des to lad & distal circumflex         Dr  [name]  . Left heart catheterization with coronary angiogram N/A 11/09/2014    Procedure: LEFT HEART CATHETERIZATION WITH CORONARY ANGIOGRAM;  Surgeon: Laverda Page, MD;  Location: [facility] CATH LAB;  Service: Cardiovascular;  Laterality: N/A;    Allergies  Allergen Reactions  . Sulfa Antibiotics Rash    Current Outpatient Prescriptions  Medication Sig Dispense Refill  . ACCU-CHEK FASTCLIX LANCETS MISC Check blood sugar TID & QHS 102 each 2  . amiodarone (PACERONE) 400 MG tablet Take 0.5 tablets (200 mg total) by mouth daily. 60 tablet 0  . amLODipine (NORVASC) 10 MG tablet Take 1  tablet (10 mg total) by mouth daily. 30 tablet 2  . apixaban (ELIQUIS) 5 MG TABS tablet Take 1 tablet (5 mg total) by mouth 2 (two) times daily. 180 tablet 3  . aspirin 81 MG tablet Take 81 mg by mouth daily.    Marland Kitchen atorvastatin (LIPITOR) 10 MG tablet TAKE 1 TABLET BY MOUTH DAILY. 30 tablet 0  . atorvastatin (LIPITOR) 10 MG tablet Take 1 tablet (10 mg total) by mouth daily. (Patient taking differently: Take 20 mg by mouth daily. ) 30 tablet 2  . atorvastatin (LIPITOR) 20 MG tablet Take 20 mg by mouth daily.  5  . blood glucose meter kit and supplies KIT Check blood sugar TID & QHS 1 each 0  . Blood Glucose Monitoring Suppl (ACCU-CHEK ADVANTAGE DIABETES) kit Use as instructed 1 each 0  .  chlorthalidone (HYGROTON) 25 MG tablet Take 25 mg by mouth daily. Take Two tablets daily    . clopidogrel (PLAVIX) 75 MG tablet Take 1 tablet (75 mg total) by mouth daily with breakfast. 60 tablet 6  . furosemide (LASIX) 40 MG tablet Take 1 tablet (40 mg total) by mouth every morning. 30 tablet 2  . GlucoCom Lancets MISC Check blood sugar TID & QHS 100 each 0  . glucose blood (ACCU-CHEK AVIVA PLUS) test strip Use as instructed 100 each 12  . glucose blood (CHOICE DM FORA G20 TEST STRIPS) test strip Check blood sugar TID & QHS 100 each 12  . glucose blood test strip Use as instructed 100 each 12  . HUMALOG MIX 75/25 KWIKPEN (75-25) 100 UNIT/ML Kwikpen   0  . hydrALAZINE (APRESOLINE) 100 MG tablet TAKE ONE TABLET BY MOUTH THREE TIMES DAILY 90 tablet 1  . metoprolol (LOPRESSOR) 100 MG tablet Take 0.5 tablets (50 mg total) by mouth 2 (two) times daily. 60 tablet 3  . Multiple Vitamins-Minerals (MULTIVITAMINS THER. W/MINERALS) TABS Take 1 tablet by mouth daily. Centrum Silver    . glipiZIDE (GLUCOTROL) 10 MG tablet TAKE 1 TABLET BY MOUTH 2 TIMES DAILY. (Patient not taking: Reported on 11/04/2015) 60 tablet 3  . sitaGLIPtin (JANUVIA) 25 MG tablet Take 1 tablet (25 mg total) by mouth daily. (Patient not taking: Reported on  11/04/2015) 90 tablet 3  . TRESIBA FLEXTOUCH 100 UNIT/ML SOPN daily. Reported on 11/04/2015  0   No current facility-administered medications for this visit.    ROS: See HPI for pertinent positives and negatives.   Physical Examination  Filed Vitals:   11/04/15 1303  BP: 158/64  Pulse: 61  Temp: 97.1 F (36.2 C)  TempSrc: Oral  Resp: 16  Height: 6' (1.829 m)  Weight: 245 lb (111.131 kg)  SpO2: 98%   Body mass index is 33.22 kg/(m^2).  General: A&O x 3, WDWN, obese male. Gait: normal Eyes: PERRLA. Pulmonary: CTAB, without wheezes , rales or rhonchi. Cardiac: regular rhythm, no detected murmur. Pacemaker palpated subcutaneously.    Carotid Bruits Right Left   Negative Negative  Aorta is not palpable. Radial pulses: 2+ palpable and =   VASCULAR EXAM: Extremities with ischemic changes, left lower foot is surgically absent; without open wounds. Small black dry area at the tip of right 2nd toe,  well circumscribed, no drainage. Right foot with dependent rubor.      LE Pulses Right Left   FEMORAL 2+ palpable 3+ palpable    POPLITEAL not palpable   Palpable medial graft   POSTERIOR TIBIAL not palpable, audible Doppler signal  not palpable, audible Doppler signal    DORSALIS PEDIS  ANTERIOR TIBIAL Not palpable, audible Doppler signal  2+ palpable    Abdomen: soft, NT, no palpable masses. Skin: no rashes, see Extremities. Musculoskeletal: no muscle wasting or atrophy, left lower foot is surgically absent. Neurologic: A&O X 3; Appropriate Affect ; SENSATION: normal; MOTOR FUNCTION: moving all extremities equally, motor strength 5/5 throughout. Speech is fluent/normal. CN 2-12 intact.                      Non-Invasive Vascular Imaging: DATE: 11/04/2015 LOWER EXTREMITY ARTERIAL EVALUATION    INDICATION: Peripheral vascular disease     PREVIOUS INTERVENTION(S): Left femoral tibial artery bypass graft with left ray amputation 10/27/2013.    DUPLEX EXAM: Duplex evaluation of the lower extremity arterial system to include the common femoral, superficial femoral, popliteal, and tibial  arteries and bypass graft(s) and/or stent(s) if present.      FINDINGS:                                       RIGHT LOWER EXTREMITY:  Heterogeneous plaque present throughout the lower extremity with hemodynamically significant stenosis present at the distal superficial femoral artery.    LEFT LOWER EXTREMITY:  Patent femoral-tibial artery bypass graft.      See the attached diagram for velocities.  IMPRESSION:  Right distal superficial femoral artery stenosis present in the 50%-74% range, which may be underestimated due to plaque and location difficult to interrogate. No color or spectral Doppler waveform could be obtained involving the left peroneal artery suggestive of vessel occlusion. Patent left femoral to tibial artery bypass graft, no hemodynamically significant stenosis. Decreased right and stable left ankle brachial indices since previous study on 03/30/2015.    ANKLE/BRACHIAL INDEX - Right = 0.27                     Left =   0.96         (Please see complete report)                   ASSESSMENT: Alexander [name] is a 54 y.o. male who is s/p left femoral tibial artery bypass graft with left ray amputation 10/27/2013.  Today's bilateral LE arterial duplex suggests right distal superficial femoral artery stenosis present in the 50%-74% range, which may be underestimated due to plaque and location difficult to interrogate. No color or spectral Doppler waveform could be obtained involving the left peroneal artery suggestive of vessel occlusion. Patent left femoral to tibial  artery bypass graft, no hemodynamically significant stenosis. Decreased right and stable left ankle brachial indices since previous study on 03/30/2015. Critical ischemia of the right LE: right ABI today of 0.27 compared to 0.79 on 03/30/15, all monophasic waveforms. Left ABI of 0.96 with all triphasic waveforms, compared to 1.07 on 03/30/15.  He quit tobacco use in November. His DM is uncontrolled.  He does not seem to have claudication sx's in his right leg, he does seem to have mild/moderate pain in his left foot. He has dependent rubor in his left foot and a dry black area at the tip of his right second toe.   Last creatinine result on file was 1.52 in March of 2016.  Face to face time with patient was 25 minutes. Over 50% of this time was spent on counseling and coordination of care.  PLAN:  Based on the patient's vascular studies and examination, and after discussing with Dr. Donnetta Hutching, will schedule pt for outpt arteriogram with bilateral run off, possible intervention, soonest available vascular surgeon.  I discussed in depth with the patient the nature of atherosclerosis, and emphasized the importance of maximal medical management including strict control of blood pressure, blood glucose, and lipid levels, obtaining regular exercise, and continued cessation of smoking.  The patient is aware that without maximal medical management the underlying atherosclerotic disease process will progress, limiting the benefit of any interventions.  The patient was given information about PAD including signs, symptoms, treatment, what symptoms should prompt the patient to seek immediate medical care, and risk reduction measures to take.  Clemon Chambers, RN, MSN, FNP-C [facility] Phone: [phone]  Clinic MD: Early on call  11/04/2015 1:15 PM

## 2015-11-08 NOTE — Discharge Instructions (Signed)
Angiogram, Care After °Refer to this sheet in the next few weeks. These instructions provide you with information about caring for yourself after your procedure. Your health care provider may also give you more specific instructions. Your treatment has been planned according to current medical practices, but problems sometimes occur. Call your health care provider if you have any problems or questions after your procedure. °WHAT TO EXPECT AFTER THE PROCEDURE °After your procedure, it is typical to have the following: °· Bruising at the catheter insertion site that usually fades within 1-2 weeks. °· Blood collecting in the tissue (hematoma) that may be painful to the touch. It should usually decrease in size and tenderness within 1-2 weeks. °HOME CARE INSTRUCTIONS °· Take medicines only as directed by your health care provider. °· You may shower 24-48 hours after the procedure or as directed by your health care provider. Remove the bandage (dressing) and gently wash the site with plain soap and water. Pat the area dry with a clean towel. Do not rub the site, because this may cause bleeding. °· Do not take baths, swim, or use a hot tub until your health care provider approves. °· Check your insertion site every day for redness, swelling, or drainage. °· Do not apply powder or lotion to the site. °· Do not lift over 10 lb (4.5 kg) for 5 days after your procedure or as directed by your health care provider. °· Ask your health care provider when it is okay to: °¨ Return to work or school. °¨ Resume usual physical activities or sports. °¨ Resume sexual activity. °· Do not drive home if you are discharged the same day as the procedure. Have someone else drive you. °· You may drive 24 hours after the procedure unless otherwise instructed by your health care provider. °· Do not operate machinery or power tools for 24 hours after the procedure or as directed by your health care provider. °· If your procedure was done as an  outpatient procedure, which means that you went home the same day as your procedure, a responsible adult should be with you for the first 24 hours after you arrive home. °· Keep all follow-up visits as directed by your health care provider. This is important. °SEEK MEDICAL CARE IF: °· You have a fever. °· You have chills. °· You have increased bleeding from the catheter insertion site. Hold pressure on the site.  CALL 911 °SEEK IMMEDIATE MEDICAL CARE IF: °· You have unusual pain at the catheter insertion site. °· You have redness, warmth, or swelling at the catheter insertion site. °· You have drainage (other than a small amount of blood on the dressing) from the catheter insertion site. °· The catheter insertion site is bleeding, and the bleeding does not stop after 30 minutes of holding steady pressure on the site. °· The area near or just beyond the catheter insertion site becomes pale, cool, tingly, or numb. °  °This information is not intended to replace advice given to you by your health care provider. Make sure you discuss any questions you have with your health care provider. °  °Document Released: 05/17/2005 Document Revised: 11/19/2014 Document Reviewed: 04/01/2013 °Elsevier Interactive Patient Education ©2016 [employer] ° °

## 2015-11-08 NOTE — Interval H&P Note (Signed)
History and Physical Interval Note:  11/08/2015 12:01 PM  Alexander [name]  has presented today for surgery, with the diagnosis of critical limb ischemia of RLE  The various methods of treatment have been discussed with the patient and family. After consideration of risks, benefits and other options for treatment, the patient has consented to  Procedure(s): Abdominal Aortogram (N/A) as a surgical intervention .  The patient's history has been reviewed, patient examined, no change in status, stable for surgery.  I have reviewed the patient's chart and labs.  Questions were answered to the patient's satisfaction.     Annamarie Major

## 2015-11-08 NOTE — Progress Notes (Signed)
Site area: left groin a 5 french arterial sheath ws removed  Site Prior to Removal:  Level 0  Pressure Applied For 15 MINUTES    Minutes Beginning at 1610  Manual:   Yes.    Patient Status During Pull:  stable  Post Pull Groin Site:  Level 0  Post Pull Instructions Given:  Yes.    Post Pull Pulses Present:  Yes.    Dressing Applied:  Yes.    Comments:  VS remain stable during sheath pull

## 2015-11-08 NOTE — Op Note (Signed)
Patient name: Alexander [name] MRN: [id number] DOB: 06/09/1961 Sex: male  11/08/2015 Pre-operative Diagnosis: Right toe ulcer Post-operative diagnosis:  Same Surgeon:  Annamarie Major Procedure Performed:  1.  Ultrasound-guided access, left femoral artery  2.  Abdominal aortogram  3.  Right lower extremity runoff  4.  Atherectomy, right popliteal artery  5.  Drug coated balloon angioplasty, right popliteal artery    Indications:  The patient has previously undergone a left femoral to posterior tibial artery bypass graft by Dr. Scot Dock as well as transmetatarsal amputation.  He presented recently with a new spot on his right second toe.  He comes in for further evaluation and possible intervention  Procedure:  The patient was identified in the holding area and taken to room 8.  The patient was then placed supine on the table and prepped and draped in the usual sterile fashion.  A time out was called.  Ultrasound was used to evaluate the left common femoral artery.  It was patent .  A digital ultrasound image was acquired.  A micropuncture needle was used to access the left common femoral artery under ultrasound guidance.  An 018 wire was advanced without resistance and a micropuncture sheath was placed.  The 018 wire was removed and a benson wire was placed.  The micropuncture sheath was exchanged for a 5 french sheath.  An omniflush catheter was advanced over the wire to the level of L-1.  An abdominal angiogram was obtained.  Next, using the omniflush catheter and a benson wire, the aortic bifurcation was crossed and the catheter was placed into theright external iliac artery and right runoff was obtained.  left runoff was  not performed given the amount of contrast used for the intervention  Findings:   Aortogram:  No significant renal artery stenosis.  The infrarenal abdominal aorta and bilateral common and external iliac arteries are widely patent.  Right Lower Extremity:  The right common  femoral, profunda femoral, superficial femoral artery are patent throughout their course.  Within the above-knee popliteal artery there is a short segment occlusion which is heavily calcified.  Just beyond this there is a 2 cm area of greater than 90% stenosis.  The below knee popliteal artery is mildly diseased but patent without hemodynamically significant stenosis.  There is three-vessel runoff.  Left Lower Extremity:  Not evaluated given contrast administration for right leg intervention  Intervention:  After the above images were acquired, the decision to proceed with intervention was made.  Over a Amplatz Super Stiff wire, a 7 French 45 cm sheath was advanced into the right external iliac artery.  The patient was fully heparinized.  Using a Glidewire and a quick cross catheter, both lesions in the above-knee popliteal artery were successfully crossed.  A contrast injection with the catheter beyond the lesions in the popliteal artery was performed, confirming successful    crossing.  A Viper wire was then placed.  Atherectomy was then performed using a CSI 2.0 classic device.  This was performed several times at low medium and high speeds.  Next, drug coated balloon angioplasty was performed using a Lutonix 5 x 1 20 balloon.  This was taken to 10 atm and held for 2 minutes.  Completion imaging revealed resolution of the stenosis.  Runoff of the leg was unchanged.  At this point, catheters and wires were removed.  The patient be taken the holding area for sheath pull once his coronaries profile corrects  Impression:  #1  no significant aortoiliac occlusive disease  #2   tandem above-knee popliteal artery lesions.  The first was a total occlusion, short segment.  The second was a 90% lesion.  Both were successfully treated with atherectomy using a CSI 2.0 classic device followed by drug coated balloon angioplasty using a 5 x 1 20 Lutonix balloon.  Residual stenosis was less than 10%  #3   three-vessel  runoff     V. Annamarie Major, M.D. [facility] [place] Office: [phone] Pager:  [phone]

## 2015-11-09 ENCOUNTER — Telehealth: Payer: Self-pay | Admitting: Surgery

## 2015-11-09 ENCOUNTER — Encounter (HOSPITAL_COMMUNITY): Payer: Self-pay | Admitting: Surgery

## 2015-11-09 NOTE — Telephone Encounter (Addendum)
sched u/s on 12/12/15  sched NP on 12/13/15  Spoke to pt to inform him of appt.  ----- Message from Mena Goes, RN sent at 11/08/2015  5:12 PM EST ----- Regarding: schedule   ----- Message -----    From: Serafina Mitchell, MD    Sent: 11/08/2015   2:15 PM      To: [facility] Charge Pool  11/08/2015:  Surgeon:  Annamarie Major Procedure Performed:  1.  Ultrasound-guided access, left femoral artery  2.  Abdominal aortogram  3.  Right lower extremity runoff  4.  Atherectomy, right popliteal artery  5.  Drug coated balloon angioplasty, right popliteal artery  Follow-up with Vinnie Level in one month with duplex of the right leg and ABI

## 2015-11-10 ENCOUNTER — Encounter (HOSPITAL_COMMUNITY)
Admission: RE | Admit: 2015-11-10 | Discharge: 2015-11-10 | Disposition: A | Discharge status: Home / Self Care | Payer: Medicare Other | Source: Ambulatory Visit | Attending: Nephrology | Admitting: Nephrology

## 2015-11-10 DIAGNOSIS — D631 Anemia in chronic kidney disease: Secondary | ICD-10-CM | POA: Diagnosis not present

## 2015-11-10 LAB — POCT HEMOGLOBIN-HEMACUE: HEMOGLOBIN: 11.5 g/dL — AB (ref 13.0–17.0)

## 2015-11-10 MED ORDER — DARBEPOETIN ALFA 60 MCG/0.3ML IJ SOSY
60.0000 ug | PREFILLED_SYRINGE | INTRAMUSCULAR | Status: DC
  Administered 2015-11-10: 60 ug via SUBCUTANEOUS

## 2015-11-10 MED ORDER — DARBEPOETIN ALFA 60 MCG/0.3ML IJ SOSY
PREFILLED_SYRINGE | INTRAMUSCULAR | Status: AC
  Filled 2015-11-10: qty 0.3

## 2015-11-12 ENCOUNTER — Encounter (HOSPITAL_COMMUNITY): Payer: Self-pay

## 2015-11-12 ENCOUNTER — Emergency Department (HOSPITAL_COMMUNITY): Payer: Medicare Other

## 2015-11-12 ENCOUNTER — Emergency Department (EMERGENCY_DEPARTMENT_HOSPITAL)
Admit: 2015-11-12 | Discharge: 2015-11-12 | Disposition: A | Discharge status: Home / Self Care | Payer: Medicare Other | Attending: Emergency Medicine | Admitting: Emergency Medicine

## 2015-11-12 ENCOUNTER — Emergency Department (HOSPITAL_COMMUNITY)
Admission: EM | Admit: 2015-11-12 | Discharge: 2015-11-12 | Disposition: A | Discharge status: Home / Self Care | Payer: Medicare Other | Attending: Emergency Medicine | Admitting: Emergency Medicine

## 2015-11-12 DIAGNOSIS — Z950 Presence of cardiac pacemaker: Secondary | ICD-10-CM | POA: Insufficient documentation

## 2015-11-12 DIAGNOSIS — Z862 Personal history of diseases of the blood and blood-forming organs and certain disorders involving the immune mechanism: Secondary | ICD-10-CM | POA: Diagnosis not present

## 2015-11-12 DIAGNOSIS — Z87891 Personal history of nicotine dependence: Secondary | ICD-10-CM | POA: Insufficient documentation

## 2015-11-12 DIAGNOSIS — I4891 Unspecified atrial fibrillation: Secondary | ICD-10-CM | POA: Diagnosis not present

## 2015-11-12 DIAGNOSIS — L97519 Non-pressure chronic ulcer of other part of right foot with unspecified severity: Secondary | ICD-10-CM | POA: Insufficient documentation

## 2015-11-12 DIAGNOSIS — Z79899 Other long term (current) drug therapy: Secondary | ICD-10-CM | POA: Diagnosis not present

## 2015-11-12 DIAGNOSIS — I2510 Atherosclerotic heart disease of native coronary artery without angina pectoris: Secondary | ICD-10-CM | POA: Insufficient documentation

## 2015-11-12 DIAGNOSIS — Z794 Long term (current) use of insulin: Secondary | ICD-10-CM | POA: Insufficient documentation

## 2015-11-12 DIAGNOSIS — I129 Hypertensive chronic kidney disease with stage 1 through stage 4 chronic kidney disease, or unspecified chronic kidney disease: Secondary | ICD-10-CM | POA: Insufficient documentation

## 2015-11-12 DIAGNOSIS — H269 Unspecified cataract: Secondary | ICD-10-CM | POA: Insufficient documentation

## 2015-11-12 DIAGNOSIS — Z951 Presence of aortocoronary bypass graft: Secondary | ICD-10-CM | POA: Diagnosis not present

## 2015-11-12 DIAGNOSIS — Z7902 Long term (current) use of antithrombotics/antiplatelets: Secondary | ICD-10-CM | POA: Insufficient documentation

## 2015-11-12 DIAGNOSIS — Z9861 Coronary angioplasty status: Secondary | ICD-10-CM | POA: Insufficient documentation

## 2015-11-12 DIAGNOSIS — M7989 Other specified soft tissue disorders: Secondary | ICD-10-CM | POA: Diagnosis not present

## 2015-11-12 DIAGNOSIS — M1990 Unspecified osteoarthritis, unspecified site: Secondary | ICD-10-CM | POA: Diagnosis not present

## 2015-11-12 DIAGNOSIS — L03115 Cellulitis of right lower limb: Secondary | ICD-10-CM | POA: Insufficient documentation

## 2015-11-12 DIAGNOSIS — Z9889 Other specified postprocedural states: Secondary | ICD-10-CM | POA: Diagnosis not present

## 2015-11-12 DIAGNOSIS — E785 Hyperlipidemia, unspecified: Secondary | ICD-10-CM | POA: Insufficient documentation

## 2015-11-12 DIAGNOSIS — E11621 Type 2 diabetes mellitus with foot ulcer: Secondary | ICD-10-CM

## 2015-11-12 DIAGNOSIS — Z7982 Long term (current) use of aspirin: Secondary | ICD-10-CM | POA: Diagnosis not present

## 2015-11-12 DIAGNOSIS — N189 Chronic kidney disease, unspecified: Secondary | ICD-10-CM | POA: Insufficient documentation

## 2015-11-12 DIAGNOSIS — Z7901 Long term (current) use of anticoagulants: Secondary | ICD-10-CM | POA: Insufficient documentation

## 2015-11-12 LAB — BASIC METABOLIC PANEL
Anion gap: 11 (ref 5–15)
BUN: 26 mg/dL — AB (ref 6–20)
CALCIUM: 9.2 mg/dL (ref 8.9–10.3)
CO2: 22 mmol/L (ref 22–32)
CREATININE: 1.48 mg/dL — AB (ref 0.61–1.24)
Chloride: 100 mmol/L — ABNORMAL LOW (ref 101–111)
GFR calc Af Amer: 59 mL/min — ABNORMAL LOW (ref >60)
GFR calc non Af Amer: 51 mL/min — ABNORMAL LOW (ref >60)
GLUCOSE: 227 mg/dL — AB (ref 65–99)
Potassium: 3.8 mmol/L (ref 3.5–5.1)
Sodium: 133 mmol/L — ABNORMAL LOW (ref 135–145)

## 2015-11-12 LAB — CBC WITH DIFFERENTIAL/PLATELET
Basophils Absolute: 0.1 10*3/uL (ref 0.0–0.1)
Basophils Relative: 1 %
Eosinophils Absolute: 0.2 10*3/uL (ref 0.0–0.7)
Eosinophils Relative: 2 %
HEMATOCRIT: 34 % — AB (ref 39.0–52.0)
Hemoglobin: 12.1 g/dL — ABNORMAL LOW (ref 13.0–17.0)
LYMPHS PCT: 21 %
Lymphs Abs: 1.9 10*3/uL (ref 0.7–4.0)
MCH: 31 pg (ref 26.0–34.0)
MCHC: 35.6 g/dL (ref 30.0–36.0)
MCV: 87.2 fL (ref 78.0–100.0)
MONO ABS: 0.6 10*3/uL (ref 0.1–1.0)
MONOS PCT: 6 %
NEUTROS ABS: 6.4 10*3/uL (ref 1.7–7.7)
Neutrophils Relative %: 70 %
Platelets: 219 10*3/uL (ref 150–400)
RBC: 3.9 MIL/uL — ABNORMAL LOW (ref 4.22–5.81)
RDW: 13.2 % (ref 11.5–15.5)
WBC: 9.1 10*3/uL (ref 4.0–10.5)

## 2015-11-12 LAB — D-DIMER, QUANTITATIVE: D-Dimer, Quant: 0.58 ug/mL-FEU — ABNORMAL HIGH (ref 0.00–0.50)

## 2015-11-12 MED ORDER — CEPHALEXIN 250 MG PO CAPS
500.0000 mg | ORAL_CAPSULE | Freq: Once | ORAL | Status: AC
  Administered 2015-11-12: 500 mg via ORAL
  Filled 2015-11-12: qty 2

## 2015-11-12 MED ORDER — DOXYCYCLINE HYCLATE 100 MG PO TABS
100.0000 mg | ORAL_TABLET | Freq: Once | ORAL | Status: AC
  Administered 2015-11-12: 100 mg via ORAL
  Filled 2015-11-12: qty 1

## 2015-11-12 MED ORDER — DOXYCYCLINE HYCLATE 100 MG PO CAPS: 100.0000 mg | ORAL_CAPSULE | Freq: Two times a day (BID) | ORAL | Status: DC

## 2015-11-12 MED ORDER — CEPHALEXIN 500 MG PO CAPS: 500.0000 mg | ORAL_CAPSULE | Freq: Four times a day (QID) | ORAL | Status: DC

## 2015-11-12 NOTE — ED Notes (Signed)
EDP at bedside  

## 2015-11-12 NOTE — Progress Notes (Signed)
VASCULAR LAB PRELIMINARY  PRELIMINARY  PRELIMINARY  PRELIMINARY  Right lower extremity venous duplex completed.    Preliminary report:  There is no DVT or SVT noted in the right lower extremity.   Daksh Coates, RVT 11/12/2015, 5:46 PM

## 2015-11-12 NOTE — ED Provider Notes (Signed)
CSN: [id number]     Arrival date & time 11/12/15  1550 History   First MD Initiated Contact with Patient 11/12/15 1818     Chief Complaint  Patient presents with  . Leg Swelling     (Consider location/radiation/quality/duration/timing/severity/associated sxs/prior Treatment) HPI Comments: 54 y/o male with hx of CAD, PAD (s/p stent placement to the right popiteal region on 12.25), IDDM, tobacco use comes in with cc of right foot swelling. Pt reports noticing increased leg swelling this morning. He has tightness to the leg, no numbness, tingling and denies significant pain or discomfort.  No recent trauma. Denies n/v/f/c.   The history is provided by the patient.    Past Medical History  Diagnosis Date  . Hypertension   . Arthritis   . Diabetes mellitus     dx---been  awhile  . Anemia   . Alcohol abuse   . Chronic kidney disease   . Hyperlipemia   . Cataract     BILATERAL  . Cardiac syncope 10/07/14    rapid A fib with 8 sec pauses on converison with syncope- temp pacing wire placed then PPM  . Atrial fibrillation ([place])   . Coronary artery disease   . Presence of permanent cardiac pacemaker 10/08/2014  . Peripheral vascular disease [facility])    Past Surgical History  Procedure Laterality Date  . Lumbar laminectomy  06/13/2012    Procedure: MICRODISCECTOMY LUMBAR LAMINECTOMY;  Surgeon: Jessy Oto, MD;  Location: [place];  Service: Orthopedics;  Laterality: N/A;  Central laminectomy L2-3, L3-4, L4-5  . Amputation Left 08/19/2013    Procedure: AMPUTATION RAY;  Surgeon: Alta Corning, MD;  Location: [place];  Service: Orthopedics;  Laterality: Left;  ray amputation left 5th  . Femoral-tibial bypass graft Left 10/27/2013    Procedure: BYPASS GRAFT LEFT FEMORAL- POSTERIOR TIBIAL ARTERY;  Surgeon: Angelia Mould, MD;  Location: [place];  Service: Vascular;  Laterality: Left;  . I&d extremity Left 10/27/2013    Procedure: IRRIGATION AND DEBRIDEMENT EXTREMITY- LEFT FOOT;  Surgeon:  Angelia Mould, MD;  Location: [place];  Service: Vascular;  Laterality: Left;  . Amputation Left 10/27/2013    Procedure: AMPUTATION DIGIT-LEFT 4TH TOE, 4th and 5th metatarsal.;  Surgeon: Angelia Mould, MD;  Location: [place];  Service: Vascular;  Laterality: Left;  . Amputation Left 11/04/2013    Procedure: LEFT FOOT TRANS-METATARSAL AMPUTATION WITH WOUND CLOSURE ;  Surgeon: Alta Corning, MD;  Location: [place];  Service: Orthopedics;  Laterality: Left;  . Pacemaker insertion  10/08/14    MDT Advisa MRI compatible dual chamber pacemaker implanted by Dr Caryl Comes for syncope with post-termination pauses  . Temporary pacemaker insertion Bilateral 10/07/2014    Procedure: TEMPORARY PACEMAKER INSERTION;  Surgeon: Laverda Page, MD;  Location: [facility] CATH LAB;  Service: Cardiovascular;  Laterality: Bilateral;  . Cardiac catheterization  10/07/2014    Procedure: LEFT HEART CATH AND CORONARY ANGIOGRAPHY;  Surgeon: Laverda Page, MD;  Location: [facility] CATH LAB;  Service: Cardiovascular;;  . Permanent pacemaker insertion N/A 10/08/2014    Procedure: PERMANENT PACEMAKER INSERTION;  Surgeon: Deboraha Sprang, MD;  Location: [facility] CATH LAB;  Service: Cardiovascular;  Laterality: N/A;  . Percutaneous coronary stent intervention (pci-s)  11/09/2014    des to lad & distal circumflex         Dr  [name]  . Left heart catheterization with coronary angiogram N/A 11/09/2014    Procedure: LEFT HEART CATHETERIZATION WITH CORONARY ANGIOGRAM;  Surgeon: Cammy Brochure  Carlynn Herald, MD;  Location: [place] CATH LAB;  Service: Cardiovascular;  Laterality: N/A;  . Peripheral vascular catheterization N/A 11/08/2015    Procedure: Abdominal Aortogram;  Surgeon: Serafina Mitchell, MD;  Location: [place] CV LAB;  Service: Cardiovascular;  Laterality: N/A;  . Lower extremity angiogram Right 11/08/2015    Procedure: Lower Extremity Angiogram;  Surgeon: Serafina Mitchell, MD;  Location: [place] CV LAB;  Service: Cardiovascular;  Laterality:  Right;  . Peripheral vascular catheterization Right 11/08/2015    Procedure: Peripheral Vascular Atherectomy;  Surgeon: Serafina Mitchell, MD;  Location: [place] CV LAB;  Service: Cardiovascular;  Laterality: Right;   Family History  Problem Relation Age of Onset  . Diabetes type II Mother   . Hypertension Mother   . Diabetes Mother   . Liver cancer Father   . Diabetes type II Sister   . Breast cancer Sister   . Diabetes Sister   . Hypertension Sister   . Diabetes type II Brother   . Kidney failure Brother   . Diabetes Brother   . Hypertension Brother   . Diabetes type II Sister    Social History  Substance Use Topics  . Smoking status: Former Smoker -- 1.50 packs/day for 30 years    Types: Cigars    Quit date: 10/07/2014  . Smokeless tobacco: Never Used  . Alcohol Use: 21.0 oz/week    35 Cans of beer per week     Comment: 6 of 12 oz beers a day    Review of Systems  Constitutional: Negative for fever and chills.  Respiratory: Negative for shortness of breath.   Cardiovascular: Negative for chest pain.  Gastrointestinal: Negative for nausea.  Genitourinary: Negative for dysuria.  Musculoskeletal: Negative for myalgias and arthralgias.  Skin: Positive for rash.  Allergic/Immunologic: Positive for immunocompromised state.  Hematological: Does not bruise/bleed easily.      Allergies  Sulfa antibiotics  Home Medications   Prior to Admission medications   Medication Sig Start Date End Date Taking? Authorizing Provider  amLODipine (NORVASC) 10 MG tablet Take 1 tablet (10 mg total) by mouth daily. 10/09/14  Yes Adrian Prows, MD  apixaban (ELIQUIS) 5 MG TABS tablet Take 1 tablet (5 mg total) by mouth 2 (two) times daily. 01/14/15  Yes Lorayne Marek, MD  aspirin EC 81 MG tablet Take 81 mg by mouth daily.   Yes Historical Provider, MD  atorvastatin (LIPITOR) 20 MG tablet Take 20 mg by mouth daily. 10/09/15  Yes Historical Provider, MD  chlorthalidone (HYGROTON) 25 MG tablet  Take 25 mg by mouth 2 (two) times daily.    Yes Historical Provider, MD  clopidogrel (PLAVIX) 75 MG tablet Take 1 tablet (75 mg total) by mouth daily with breakfast. 11/10/14  Yes Adrian Prows, MD  hydrALAZINE (APRESOLINE) 100 MG tablet TAKE ONE TABLET BY MOUTH THREE TIMES DAILY Patient taking differently: TAKE ONE TABLET BY MOUTH TWO TIMES DAILY 05/13/15  Yes Lorayne Marek, MD  Insulin Lispro Prot & Lispro (HUMALOG MIX 75/25 KWIKPEN) (75-25) 100 UNIT/ML Kwikpen Inject 30-40 Units into the skin 2 (two) times daily with a meal. Inject 40 units subcutaneously before breakfast and 30 units before supper   Yes Historical Provider, MD  metoprolol (LOPRESSOR) 100 MG tablet Take 0.5 tablets (50 mg total) by mouth 2 (two) times daily. Patient taking differently: Take 50 mg by mouth 2 (two) times daily with a meal.  02/07/15  Yes Lorayne Marek, MD  Multiple Vitamin (MULTIVITAMIN WITH MINERALS) TABS  tablet Take 1 tablet by mouth daily.   Yes Historical Provider, MD  ACCU-CHEK FASTCLIX LANCETS MISC Check blood sugar TID & QHS 10/01/14   Lorayne Marek, MD  amiodarone (PACERONE) 400 MG tablet Take 0.5 tablets (200 mg total) by mouth daily. Patient not taking: Reported on 11/04/2015 11/10/14   Adrian Prows, MD  atorvastatin (LIPITOR) 10 MG tablet Take 1 tablet (10 mg total) by mouth daily. Patient not taking: Reported on 11/04/2015 04/18/15   Lorayne Marek, MD  blood glucose meter kit and supplies KIT Check blood sugar TID & QHS 02/10/15   Lorayne Marek, MD  Blood Glucose Monitoring Suppl (ACCU-CHEK ADVANTAGE DIABETES) kit Use as instructed 10/01/14   Lorayne Marek, MD  cephALEXin (KEFLEX) 500 MG capsule Take 1 capsule (500 mg total) by mouth 4 (four) times daily. 11/12/15   Varney Biles, MD  doxycycline (VIBRAMYCIN) 100 MG capsule Take 1 capsule (100 mg total) by mouth 2 (two) times daily. 11/12/15   Varney Biles, MD  furosemide (LASIX) 40 MG tablet Take 1 tablet (40 mg total) by mouth every morning. Patient not  taking: Reported on 11/04/2015 11/10/14   Adrian Prows, MD  GlucoCom Lancets MISC Check blood sugar TID & QHS 02/10/15   Lorayne Marek, MD  glucose blood (ACCU-CHEK AVIVA PLUS) test strip Use as instructed 10/01/14   Lorayne Marek, MD  glucose blood (CHOICE DM FORA G20 TEST STRIPS) test strip Check blood sugar TID & QHS 02/10/15   Deepak Advani, MD  glucose blood test strip Use as instructed 11/20/13   Reyne Dumas, MD  sitaGLIPtin (JANUVIA) 25 MG tablet Take 1 tablet (25 mg total) by mouth daily. Patient not taking: Reported on 11/04/2015 04/18/15   Lorayne Marek, MD   BP 169/74 mmHg  Pulse 66  Temp(Src) 98 F (36.7 C) (Oral)  Resp 17  Ht 6' (1.829 m)  Wt 245 lb (111.131 kg)  BMI 33.22 kg/m2  SpO2 98% Physical Exam  Constitutional: He is oriented to person, place, and time. He appears well-developed.  HENT:  Head: Atraumatic.  Neck: Neck supple.  Cardiovascular: Normal rate.   STRONG DOPPLER PULSE IN THE DP AND PT OF THE R FOOT  Pulmonary/Chest: Effort normal.  Musculoskeletal:  RLE is grossly swollen, worse in the foot. There is rubor, and mild dolor with that. No open ulcers. There is a hyperpigmented necrotic lesion in the distal 2nd toe of the R leg - but it is not new.   Neurological: He is alert and oriented to person, place, and time.  Skin: Skin is warm.  Nursing note and vitals reviewed.   ED Course  Procedures (including critical care time) Labs Review Labs Reviewed  CBC WITH DIFFERENTIAL/PLATELET - Abnormal; Notable for the following:    RBC 3.90 (*)    Hemoglobin 12.1 (*)    HCT 34.0 (*)    All other components within normal limits  BASIC METABOLIC PANEL - Abnormal; Notable for the following:    Sodium 133 (*)    Chloride 100 (*)    Glucose, Bld 227 (*)    BUN 26 (*)    Creatinine, Ser 1.48 (*)    GFR calc non Af Amer 51 (*)    GFR calc Af Amer 59 (*)    All other components within normal limits  D-DIMER, QUANTITATIVE (NOT AT [facility]) - Abnormal; Notable for the  following:    D-Dimer, Quant 0.58 (*)    All other components within normal limits    Imaging Review  Dg Foot 2 Views Right  11/12/2015  CLINICAL DATA:  54 year old male with right foot pain and swelling today. Initial encounter. EXAM: RIGHT FOOT - 2 VIEW COMPARISON:  None. FINDINGS: There is no evidence of acute fracture, subluxation or dislocation. Dorsal soft tissue swelling is noted. Vascular calcifications are present. IMPRESSION: Soft tissue swelling without acute bony abnormality. Electronically Signed   By: Margarette Canada M.D.   On: 11/12/2015 19:18   I have personally reviewed and evaluated these images and lab results as part of my medical decision-making.   EKG Interpretation None              MDM   Final diagnoses:  Cellulitis of right lower extremity  Diabetic ulcer of right foot associated with type 2 diabetes mellitus ([place])    Pt comes in with cc of leg swelling. Concerns for cellulitis, as he has a warm, red foot with dopplerable pulse. Recent stent placed in the same leg - but no clinical concerns for critical limb ischemia based on the hx and the exam. DVT is also on the ddx.  Will get labs and reassess.  7:57 PM Labs are reassuring. Dimer +, [place] is neg for DVT. Will tx with doxy and keflex. Pt advised to see [place] in 2 days for recheck. IF THE RASH IS GETTING WORSE, we might have to look the the ulcer a bit closer (2nd pic). He has a diabetic ulcer, that appears clean and has no foul smell to it or drainage - but he has hx of amputation in the opposite leg and it will be prudent to do an osteo eval with sed rate and crp/mri.    Varney Biles, MD 11/12/15 [id number]

## 2015-11-12 NOTE — ED Notes (Signed)
Pt had abdominal aortogram through left femoral artery, atherectomy right popliteal artery, Right lower extremity runoff and  Drug coated balloon angioplasty, right popliteal artery done 11-08-15.  Onset today RLE and right foot swollen, was instructed to come to ED for eval for blood clot. No shortness of breath or chest pain.

## 2015-11-12 NOTE — Discharge Instructions (Signed)
Please start the antibiotics as prescribed. Return to the ER in 2 days for wound recheck. See your primary doctor in 1-2 weeks time. If you are not getting better, you might need admission and further workup.   Cellulitis Cellulitis is an infection of the skin and the tissue beneath it. The infected area is usually red and tender. Cellulitis occurs most often in the arms and lower legs.  CAUSES  Cellulitis is caused by bacteria that enter the skin through cracks or cuts in the skin. The most common types of bacteria that cause cellulitis are staphylococci and streptococci. SIGNS AND SYMPTOMS   Redness and warmth.  Swelling.  Tenderness or pain.  Fever. DIAGNOSIS  Your health care provider can usually determine what is wrong based on a physical exam. Blood tests may also be done. TREATMENT  Treatment usually involves taking an antibiotic medicine. HOME CARE INSTRUCTIONS   Take your antibiotic medicine as directed by your health care provider. Finish the antibiotic even if you start to feel better.  Keep the infected arm or leg elevated to reduce swelling.  Apply a warm cloth to the affected area up to 4 times per day to relieve pain.  Take medicines only as directed by your health care provider.  Keep all follow-up visits as directed by your health care provider. SEEK MEDICAL CARE IF:   You notice red streaks coming from the infected area.  Your red area gets larger or turns dark in color.  Your bone or joint underneath the infected area becomes painful after the skin has healed.  Your infection returns in the same area or another area.  You notice a swollen bump in the infected area.  You develop new symptoms.  You have a fever. SEEK IMMEDIATE MEDICAL CARE IF:   You feel very sleepy.  You develop vomiting or diarrhea.  You have a general ill feeling (malaise) with muscle aches and pains.   This information is not intended to replace advice given to you by your  health care provider. Make sure you discuss any questions you have with your health care provider.   Document Released: 08/08/2005 Document Revised: 07/20/2015 Document Reviewed: 01/14/2012 Elsevier Interactive Patient Education [employer].

## 2015-11-17 ENCOUNTER — Encounter (HOSPITAL_COMMUNITY): Payer: Medicare Other

## 2015-11-17 ENCOUNTER — Encounter (HOSPITAL_COMMUNITY)
Admission: RE | Admit: 2015-11-17 | Discharge: 2015-11-17 | Disposition: A | Discharge status: Home / Self Care | Payer: Medicare Other | Source: Ambulatory Visit | Attending: Nephrology | Admitting: Nephrology

## 2015-11-17 DIAGNOSIS — D631 Anemia in chronic kidney disease: Secondary | ICD-10-CM | POA: Diagnosis not present

## 2015-11-17 DIAGNOSIS — N183 Chronic kidney disease, stage 3 (moderate): Secondary | ICD-10-CM | POA: Insufficient documentation

## 2015-11-17 LAB — POCT HEMOGLOBIN-HEMACUE: HEMOGLOBIN: 11.9 g/dL — AB (ref 13.0–17.0)

## 2015-11-17 MED ORDER — DARBEPOETIN ALFA 60 MCG/0.3ML IJ SOSY
60.0000 ug | PREFILLED_SYRINGE | INTRAMUSCULAR | Status: DC
  Administered 2015-11-17: 60 ug via SUBCUTANEOUS

## 2015-11-17 MED ORDER — DARBEPOETIN ALFA 60 MCG/0.3ML IJ SOSY
PREFILLED_SYRINGE | INTRAMUSCULAR | Status: AC
  Filled 2015-11-17: qty 0.3

## 2015-11-24 ENCOUNTER — Encounter (HOSPITAL_COMMUNITY)
Admission: RE | Admit: 2015-11-24 | Discharge: 2015-11-24 | Disposition: A | Discharge status: Home / Self Care | Payer: Medicare Other | Source: Ambulatory Visit | Attending: Nephrology | Admitting: Nephrology

## 2015-11-24 DIAGNOSIS — D631 Anemia in chronic kidney disease: Secondary | ICD-10-CM | POA: Diagnosis not present

## 2015-11-24 LAB — POCT HEMOGLOBIN-HEMACUE: Hemoglobin: 12.7 g/dL — ABNORMAL LOW (ref 13.0–17.0)

## 2015-11-24 MED ORDER — DARBEPOETIN ALFA 60 MCG/0.3ML IJ SOSY: 60.0000 ug | PREFILLED_SYRINGE | INTRAMUSCULAR | Status: DC

## 2015-12-02 ENCOUNTER — Encounter: Payer: Self-pay | Admitting: Family

## 2015-12-05 ENCOUNTER — Encounter: Payer: Self-pay | Admitting: Family

## 2015-12-08 ENCOUNTER — Encounter (HOSPITAL_COMMUNITY)
Admission: RE | Admit: 2015-12-08 | Discharge: 2015-12-08 | Disposition: A | Discharge status: Home / Self Care | Payer: Medicare Other | Source: Ambulatory Visit | Attending: Nephrology | Admitting: Nephrology

## 2015-12-08 DIAGNOSIS — D631 Anemia in chronic kidney disease: Secondary | ICD-10-CM | POA: Diagnosis not present

## 2015-12-08 LAB — IRON AND TIBC
Iron: 81 ug/dL (ref 45–182)
SATURATION RATIOS: 31 % (ref 17.9–39.5)
TIBC: 259 ug/dL (ref 250–450)
UIBC: 178 ug/dL

## 2015-12-08 LAB — POCT HEMOGLOBIN-HEMACUE: Hemoglobin: 12.1 g/dL — ABNORMAL LOW (ref 13.0–17.0)

## 2015-12-08 LAB — FERRITIN: FERRITIN: 149 ng/mL (ref 24–336)

## 2015-12-08 MED ORDER — DARBEPOETIN ALFA 60 MCG/0.3ML IJ SOSY: 60.0000 ug | PREFILLED_SYRINGE | INTRAMUSCULAR | Status: DC

## 2015-12-12 ENCOUNTER — Ambulatory Visit (HOSPITAL_COMMUNITY)
Admission: RE | Admit: 2015-12-12 | Discharge: 2015-12-12 | Disposition: A | Discharge status: Home / Self Care | Payer: Medicare Other | Source: Ambulatory Visit | Attending: Surgery | Admitting: Surgery

## 2015-12-12 ENCOUNTER — Ambulatory Visit (INDEPENDENT_AMBULATORY_CARE_PROVIDER_SITE_OTHER)
Admission: RE | Admit: 2015-12-12 | Discharge: 2015-12-12 | Disposition: A | Discharge status: Home / Self Care | Payer: Medicare Other | Source: Ambulatory Visit | Attending: Surgery | Admitting: Surgery

## 2015-12-12 DIAGNOSIS — E1122 Type 2 diabetes mellitus with diabetic chronic kidney disease: Secondary | ICD-10-CM | POA: Diagnosis not present

## 2015-12-12 DIAGNOSIS — E785 Hyperlipidemia, unspecified: Secondary | ICD-10-CM | POA: Diagnosis not present

## 2015-12-12 DIAGNOSIS — Z9862 Peripheral vascular angioplasty status: Secondary | ICD-10-CM

## 2015-12-12 DIAGNOSIS — I739 Peripheral vascular disease, unspecified: Secondary | ICD-10-CM

## 2015-12-12 DIAGNOSIS — N189 Chronic kidney disease, unspecified: Secondary | ICD-10-CM | POA: Diagnosis not present

## 2015-12-12 DIAGNOSIS — R0989 Other specified symptoms and signs involving the circulatory and respiratory systems: Secondary | ICD-10-CM | POA: Diagnosis present

## 2015-12-12 DIAGNOSIS — I129 Hypertensive chronic kidney disease with stage 1 through stage 4 chronic kidney disease, or unspecified chronic kidney disease: Secondary | ICD-10-CM | POA: Insufficient documentation

## 2015-12-12 DIAGNOSIS — Z950 Presence of cardiac pacemaker: Secondary | ICD-10-CM | POA: Diagnosis not present

## 2015-12-13 ENCOUNTER — Encounter: Payer: Self-pay | Admitting: Family

## 2015-12-13 ENCOUNTER — Ambulatory Visit (INDEPENDENT_AMBULATORY_CARE_PROVIDER_SITE_OTHER): Payer: Medicare Other | Admitting: Family

## 2015-12-13 VITALS — BP 160/76 | HR 69 | Ht 72.0 in | Wt 254.2 lb

## 2015-12-13 DIAGNOSIS — Z87891 Personal history of nicotine dependence: Secondary | ICD-10-CM

## 2015-12-13 DIAGNOSIS — E1151 Type 2 diabetes mellitus with diabetic peripheral angiopathy without gangrene: Secondary | ICD-10-CM

## 2015-12-13 DIAGNOSIS — I779 Disorder of arteries and arterioles, unspecified: Secondary | ICD-10-CM

## 2015-12-13 DIAGNOSIS — Z48812 Encounter for surgical aftercare following surgery on the circulatory system: Secondary | ICD-10-CM

## 2015-12-13 DIAGNOSIS — Z95828 Presence of other vascular implants and grafts: Secondary | ICD-10-CM

## 2015-12-13 NOTE — Patient Instructions (Signed)
Peripheral Vascular Disease Peripheral vascular disease (PVD) is a disease of the blood vessels that are not part of your heart and brain. A simple term for PVD is poor circulation. In most cases, PVD narrows the blood vessels that carry blood from your heart to the rest of your body. This can result in a decreased supply of blood to your arms, legs, and internal organs, like your stomach or kidneys. However, it most often affects a person's lower legs and feet. There are two types of PVD.  Organic PVD. This is the more common type. It is caused by damage to the structure of blood vessels.  Functional PVD. This is caused by conditions that make blood vessels contract and tighten (spasm). Without treatment, PVD tends to get worse over time. PVD can also lead to acute ischemic limb. This is when an arm or limb suddenly has trouble getting enough blood. This is a medical emergency. CAUSES Each type of PVD has many different causes. The most common cause of PVD is buildup of a fatty material (plaque) inside of your arteries (atherosclerosis). Small amounts of plaque can break off from the walls of the blood vessels and become lodged in a smaller artery. This blocks blood flow and can cause acute ischemic limb. Other common causes of PVD include:  Blood clots that form inside of blood vessels.  Injuries to blood vessels.  Diseases that cause inflammation of blood vessels or cause blood vessel spasms.  Health behaviors and health history that increase your risk of developing PVD. RISK FACTORS  You may have a greater risk of PVD if you:  Have a family history of PVD.  Have certain medical conditions, including:  High cholesterol.  Diabetes.  High blood pressure (hypertension).  Coronary heart disease.  Past problems with blood clots.  Past injury, such as burns or a broken bone. These may have damaged blood vessels in your limbs.  Buerger disease. This is caused by inflamed blood  vessels in your hands and feet.  Some forms of arthritis.  Rare birth defects that affect the arteries in your legs.  Use tobacco.  Do not get enough exercise.  Are obese.  Are age 50 or older. SIGNS AND SYMPTOMS  PVD may cause many different symptoms. Your symptoms depend on what part of your body is not getting enough blood. Some common signs and symptoms include:  Cramps in your lower legs. This may be a symptom of poor leg circulation (claudication).  Pain and weakness in your legs while you are physically active that goes away when you rest (intermittent claudication).  Leg pain when at rest.  Leg numbness, tingling, or weakness.  Coldness in a leg or foot, especially when compared with the other leg.  Skin or hair changes. These can include:  Hair loss.  Shiny skin.  Pale or bluish skin.  Thick toenails.  Inability to get or maintain an erection (erectile dysfunction). People with PVD are more prone to developing ulcers and sores on their toes, feet, or legs. These may take longer than normal to heal. DIAGNOSIS Your health care provider may diagnose PVD from your signs and symptoms. The health care provider will also do a physical exam. You may have tests to find out what is causing your PVD and determine its severity. Tests may include:  Blood pressure recordings from your arms and legs and measurements of the strength of your pulses (pulse volume recordings).  Imaging studies using sound waves to take pictures of   the blood flow through your blood vessels (Doppler ultrasound).  Injecting a dye into your blood vessels before having imaging studies using:  X-rays (angiogram or arteriogram).  Computer-generated X-rays (CT angiogram).  A powerful electromagnetic field and a computer (magnetic resonance angiogram or MRA). TREATMENT Treatment for PVD depends on the cause of your condition and the severity of your symptoms. It also depends on your age. Underlying  causes need to be treated and controlled. These include long-lasting (chronic) conditions, such as diabetes, high cholesterol, and high blood pressure. You may need to first try making lifestyle changes and taking medicines. Surgery may be needed if these do not work. Lifestyle changes may include:  Quitting smoking.  Exercising regularly.  Following a low-fat, low-cholesterol diet. Medicines may include:  Blood thinners to prevent blood clots.  Medicines to improve blood flow.  Medicines to improve your blood cholesterol levels. Surgical procedures may include:  A procedure that uses an inflated balloon to open a blocked artery and improve blood flow (angioplasty).  A procedure to put in a tube (stent) to keep a blocked artery open (stent implant).  Surgery to reroute blood flow around a blocked artery (peripheral bypass surgery).  Surgery to remove dead tissue from an infected wound on the affected limb.  Amputation. This is surgical removal of the affected limb. This may be necessary in cases of acute ischemic limb that are not improved through medical or surgical treatments. HOME CARE INSTRUCTIONS  Take medicines only as directed by your health care provider.  Do not use any tobacco products, including cigarettes, chewing tobacco, or electronic cigarettes. If you need help quitting, ask your health care provider.  Lose weight if you are overweight, and maintain a healthy weight as directed by your health care provider.  Eat a diet that is low in fat and cholesterol. If you need help, ask your health care provider.  Exercise regularly. Ask your health care provider to suggest some good activities for you.  Use compression stockings or other mechanical devices as directed by your health care provider.  Take good care of your feet.  Wear comfortable shoes that fit well.  Check your feet often for any cuts or sores. SEEK MEDICAL CARE IF:  You have cramps in your legs  while walking.  You have leg pain when you are at rest.  You have coldness in a leg or foot.  Your skin changes.  You have erectile dysfunction.  You have cuts or sores on your feet that are not healing. SEEK IMMEDIATE MEDICAL CARE IF:  Your arm or leg turns cold and blue.  Your arms or legs become red, warm, swollen, painful, or numb.  You have chest pain or trouble breathing.  You suddenly have weakness in your face, arm, or leg.  You become very confused or lose the ability to speak.  You suddenly have a very bad headache or lose your vision.   This information is not intended to replace advice given to you by your health care provider. Make sure you discuss any questions you have with your health care provider.   Document Released: 12/06/2004 Document Revised: 11/19/2014 Document Reviewed: 04/08/2014 Elsevier Interactive Patient Education 2016 [employer]  

## 2015-12-13 NOTE — Progress Notes (Signed)
[facility] [place] HISTORY AND PHYSICAL -PAD  History of Present Illness Alexander [name] is a 55 y.o. male patient of Dr. Scot Dock who had initially presented with extensive gangrene of his left foot. He underwent a fifth toe amputation which failed to heal. He had multilevel arterial occlusive disease. On 10/21/2013, he underwent a left common femoral artery to tibial peroneal trunk bypass with a vein graft and Ray amputation of his left fourth toe. He ultimately required a transmetatarsal amputation on the left (Dr Berenice Primas).  11/08/2015 aortogram procedures/impressions:              -Atherectomy, right popliteal artery  -Drug coated balloon angioplasty, right popliteal artery              -no significant aortoiliac occlusive disease  -tandem above-knee popliteal artery lesions. The first was a total occlusion, short segment. The second was a 90% lesion. Both were successfully treated with atherectomy using a CSI 2.0 classic device followed by drug coated balloon angioplasty using a 5 x 1 20 Lutonix balloon. Residual stenosis was less than 10%  - three-vessel runoff   Pt c/o callus lateral aspect right foot that has not worsened, he noticed this a month ago.  He sees Dr. Justin Mend at [facility] for CKD, states his kidney function is improving.  The patient denies claudication symptoms with walking. He has been walking more, yard work,  The patient denies any history of stroke, TIA, or MI.  He had syncopal episodes, found that he had several seconds of no heartbeats, pacemaker placed in November 2015, he also had a heart cath at that time, pt states no problems found.  Pt Diabetic: Yes, states better controlled, Review of records: A1C March 2016 was 7.8; pt states his last A1C was 9.0, working with his PCP to get his blood sugar under better control. Wife states A1C about August 2016 was 9.? Pt smoker: former smoker (cigars, was  10 small cigars/day, states he did not inhale, started at age 17 yrs, quit November 2015 after his syncopal episodes)  Pt meds include: Statin :Yes Betablocker: Yes ASA: Yes Other anticoagulants/antiplatelets: Eliqiuis since atrial fib history in November 2015      Past Medical History  Diagnosis Date  . Hypertension   . Arthritis   . Diabetes mellitus     dx---been  awhile  . Anemia   . Alcohol abuse   . Chronic kidney disease   . Hyperlipemia   . Cataract     BILATERAL  . Cardiac syncope 10/07/14    rapid A fib with 8 sec pauses on converison with syncope- temp pacing wire placed then PPM  . Atrial fibrillation ([place])   . Coronary artery disease   . Presence of permanent cardiac pacemaker 10/08/2014  . Peripheral vascular disease [facility])     Social History Social History  Substance Use Topics  . Smoking status: Former Smoker -- 1.50 packs/day for 30 years    Types: Cigars    Quit date: 10/07/2014  . Smokeless tobacco: Never Used  . Alcohol Use: 21.0 oz/week    35 Cans of beer per week     Comment: 6 of 12 oz beers a day    Family History Family History  Problem Relation Age of Onset  . Diabetes type II Mother   . Hypertension Mother   . Diabetes Mother   . Liver cancer Father   . Diabetes type II Sister   . Breast cancer  Sister   . Diabetes Sister   . Hypertension Sister   . Diabetes type II Brother   . Kidney failure Brother   . Diabetes Brother   . Hypertension Brother   . Diabetes type II Sister     Past Surgical History  Procedure Laterality Date  . Lumbar laminectomy  06/13/2012    Procedure: MICRODISCECTOMY LUMBAR LAMINECTOMY;  Surgeon: Jessy Oto, MD;  Location: [place];  Service: Orthopedics;  Laterality: N/A;  Central laminectomy L2-3, L3-4, L4-5  . Amputation Left 08/19/2013    Procedure: AMPUTATION RAY;  Surgeon: Alta Corning, MD;  Location: [place];  Service: Orthopedics;  Laterality: Left;  ray amputation left 5th  . Femoral-tibial bypass  graft Left 10/27/2013    Procedure: BYPASS GRAFT LEFT FEMORAL- POSTERIOR TIBIAL ARTERY;  Surgeon: Angelia Mould, MD;  Location: [place];  Service: Vascular;  Laterality: Left;  . I&d extremity Left 10/27/2013    Procedure: IRRIGATION AND DEBRIDEMENT EXTREMITY- LEFT FOOT;  Surgeon: Angelia Mould, MD;  Location: [place];  Service: Vascular;  Laterality: Left;  . Amputation Left 10/27/2013    Procedure: AMPUTATION DIGIT-LEFT 4TH TOE, 4th and 5th metatarsal.;  Surgeon: Angelia Mould, MD;  Location: [place];  Service: Vascular;  Laterality: Left;  . Amputation Left 11/04/2013    Procedure: LEFT FOOT TRANS-METATARSAL AMPUTATION WITH WOUND CLOSURE ;  Surgeon: Alta Corning, MD;  Location: [place];  Service: Orthopedics;  Laterality: Left;  . Pacemaker insertion  10/08/14    MDT Advisa MRI compatible dual chamber pacemaker implanted by Dr Caryl Comes for syncope with post-termination pauses  . Temporary pacemaker insertion Bilateral 10/07/2014    Procedure: TEMPORARY PACEMAKER INSERTION;  Surgeon: Laverda Page, MD;  Location: [facility] CATH LAB;  Service: Cardiovascular;  Laterality: Bilateral;  . Cardiac catheterization  10/07/2014    Procedure: LEFT HEART CATH AND CORONARY ANGIOGRAPHY;  Surgeon: Laverda Page, MD;  Location: [facility] CATH LAB;  Service: Cardiovascular;;  . Permanent pacemaker insertion N/A 10/08/2014    Procedure: PERMANENT PACEMAKER INSERTION;  Surgeon: Deboraha Sprang, MD;  Location[facility] CATH LAB;  Service: Cardiovascular;  Laterality: N/A;  . Percutaneous coronary stent intervention (pci-s)  11/09/2014    des to lad & distal circumflex         Dr  [name]  . Left heart catheterization with coronary angiogram N/A 11/09/2014    Procedure: LEFT HEART CATHETERIZATION WITH CORONARY ANGIOGRAM;  Surgeon: Laverda Page, MD;  Location: [facility] CATH LAB;  Service: Cardiovascular;  Laterality: N/A;  . Peripheral vascular catheterization N/A 11/08/2015    Procedure: Abdominal Aortogram;   Surgeon: Serafina Mitchell, MD;  Location: [place] CV LAB;  Service: Cardiovascular;  Laterality: N/A;  . Lower extremity angiogram Right 11/08/2015    Procedure: Lower Extremity Angiogram;  Surgeon: Serafina Mitchell, MD;  Location: [place] CV LAB;  Service: Cardiovascular;  Laterality: Right;  . Peripheral vascular catheterization Right 11/08/2015    Procedure: Peripheral Vascular Atherectomy;  Surgeon: Serafina Mitchell, MD;  Location: [place] CV LAB;  Service: Cardiovascular;  Laterality: Right;    Allergies  Allergen Reactions  . Sulfa Antibiotics Rash    Current Outpatient Prescriptions  Medication Sig Dispense Refill  . amLODipine (NORVASC) 10 MG tablet Take 1 tablet (10 mg total) by mouth daily. 30 tablet 2  . apixaban (ELIQUIS) 5 MG TABS tablet Take 1 tablet (5 mg total) by mouth 2 (two) times daily. 180 tablet 3  . aspirin EC  81 MG tablet Take 81 mg by mouth daily.    Marland Kitchen atorvastatin (LIPITOR) 10 MG tablet Take 1 tablet (10 mg total) by mouth daily. (Patient taking differently: Take 20 mg by mouth daily. ) 30 tablet 2  . chlorthalidone (HYGROTON) 25 MG tablet Take 25 mg by mouth 2 (two) times daily.     . clopidogrel (PLAVIX) 75 MG tablet Take 1 tablet (75 mg total) by mouth daily with breakfast. 60 tablet 6  . furosemide (LASIX) 40 MG tablet Take 1 tablet (40 mg total) by mouth every morning. 30 tablet 2  . hydrALAZINE (APRESOLINE) 100 MG tablet TAKE ONE TABLET BY MOUTH THREE TIMES DAILY (Patient taking differently: TAKE ONE TABLET BY MOUTH TWO TIMES DAILY) 90 tablet 1  . Insulin Lispro Prot & Lispro (HUMALOG MIX 75/25 KWIKPEN) (75-25) 100 UNIT/ML Kwikpen Inject 30-40 Units into the skin 2 (two) times daily with a meal. Inject 40 units subcutaneously before breakfast and 30 units before supper    . metoprolol (LOPRESSOR) 100 MG tablet Take 0.5 tablets (50 mg total) by mouth 2 (two) times daily. (Patient taking differently: Take 50 mg by mouth 2 (two) times daily with a meal. )  60 tablet 3  . Multiple Vitamin (MULTIVITAMIN WITH MINERALS) TABS tablet Take 1 tablet by mouth daily.    Marland Kitchen ACCU-CHEK FASTCLIX LANCETS MISC Check blood sugar TID & QHS (Patient not taking: Reported on 12/13/2015) 102 each 2  . amiodarone (PACERONE) 400 MG tablet Take 0.5 tablets (200 mg total) by mouth daily. (Patient not taking: Reported on 11/04/2015) 60 tablet 0  . blood glucose meter kit and supplies KIT Check blood sugar TID & QHS (Patient not taking: Reported on 12/13/2015) 1 each 0  . Blood Glucose Monitoring Suppl (ACCU-CHEK ADVANTAGE DIABETES) kit Use as instructed (Patient not taking: Reported on 12/13/2015) 1 each 0  . cephALEXin (KEFLEX) 500 MG capsule Take 1 capsule (500 mg total) by mouth 4 (four) times daily. (Patient not taking: Reported on 12/13/2015) 40 capsule 0  . doxycycline (VIBRAMYCIN) 100 MG capsule Take 1 capsule (100 mg total) by mouth 2 (two) times daily. (Patient not taking: Reported on 12/13/2015) 20 capsule 0  . GlucoCom Lancets MISC Check blood sugar TID & QHS (Patient not taking: Reported on 12/13/2015) 100 each 0  . glucose blood (ACCU-CHEK AVIVA PLUS) test strip Use as instructed (Patient not taking: Reported on 12/13/2015) 100 each 12  . glucose blood (CHOICE DM FORA G20 TEST STRIPS) test strip Check blood sugar TID & QHS (Patient not taking: Reported on 12/13/2015) 100 each 12  . glucose blood test strip Use as instructed (Patient not taking: Reported on 12/13/2015) 100 each 12  . sitaGLIPtin (JANUVIA) 25 MG tablet Take 1 tablet (25 mg total) by mouth daily. (Patient not taking: Reported on 11/04/2015) 90 tablet 3   No current facility-administered medications for this visit.    ROS: See HPI for pertinent positives and negatives.   Physical Examination  Filed Vitals:   12/13/15 0923 12/13/15 0933  BP: 157/78 160/76  Pulse: 69   Height: 6' (1.829 m)   Weight: 254 lb 3.2 oz (115.304 kg)   SpO2: 97%    Body mass index is 34.47 kg/(m^2).  General: A&O x 3, WDWN,  obese male. Gait: normal Eyes: PERRLA. Pulmonary: CTAB, without wheezes , rales or rhonchi. Cardiac: regular rhythm, no detected murmur. Pacemaker palpated subcutaneously.    Carotid Bruits Right Left   Negative Negative  Aorta is not palpable. Radial  pulses: 2+ palpable and =   VASCULAR EXAM: Extremities with ischemic changes, left lower foot is surgically absent; without open wounds. Small black dry area at the tip of right 2nd toe, well circumscribed, no drainage. Right foot with dependent rubor.      LE Pulses Right Left   FEMORAL 2+ palpable 3+ palpable    POPLITEAL not palpable   Palpable medial graft   POSTERIOR TIBIAL not palpable, audible Doppler signal  not palpable, audible Doppler signal    DORSALIS PEDIS  ANTERIOR TIBIAL Not palpable, audible Doppler signal  2+ palpable    Abdomen: soft, NT, no palpable masses. Skin: no rashes, see Extremities. Musculoskeletal: no muscle wasting or atrophy, left lower foot is surgically absent. Neurologic: A&O X 3; Appropriate Affect ; SENSATION: normal; MOTOR FUNCTION: moving all extremities equally, motor strength 5/5 throughout. Speech is fluent/normal. CN 2-12 intact.                            Non-Invasive Vascular Imaging: DATE: 12/12/15 LOWER EXTREMITY ARTERIAL DUPLEX EVALUATION    INDICATION: Peripheral vascular disease    PREVIOUS INTERVENTION(S): Balloon angioplasty and atherectomy right popliteal artery 11/04/2015.  Left femoral tibial artery bypass graft with left amputation 10/27/2013.    DUPLEX EXAM:     RIGHT  LEFT   Peak Systolic Velocity (cm/s) Ratio (if abnormal) Waveform  Peak Systolic Velocity  (cm/s) Ratio (if abnormal) Waveform  130  T Common Femoral Artery     81  T Deep Femoral Artery     130/202 1.5 B Superficial Femoral Artery Proximal     138  B Superficial Femoral Artery Mid     168  B Superficial Femoral Artery Distal     115/263 2.2 B Popliteal Artery     204  B Posterior Tibial Artery Dist     106  b-m Anterior Tibial Artery Distal     NV  NV Peroneal Artery Distal     0.92 Today's ABI / TBI 1.0  0.27 Previous ABI / TBI (11/04/15) 0.96    Waveform:    M - Monophasic       B - Biphasic       T - Triphasic  If Ankle Brachial Index (ABI) or Toe Brachial Index (TBI) performed, please see complete report     ADDITIONAL FINDINGS: The left lower extremity was not imaged during this exam.    IMPRESSION: 1. Irregular plaque and mildly elevated peak systolic velocities noted in the proximal superficial femoral artery consistent with a 30-49% diameter reduction. 2. Irregular plaque with mildly elevated peak systolic velocities and a velocity ratio consistent with a 50-74% diameter reduction suggestive of residual stenosis vs restenosis.   3. The peroneal artery was unable to be visualized.   See page 2     Compared to the previous exam:  Patient has undergone intervention since previous exam.      ASSESSMENT: Alexander [name] is a 55 y.o. male who is s/p left femoral tibial artery bypass graft with left ray amputation on 10/27/2013. He is also s/p aortogram on 11/08/15 with right popliteal artery atherectomy, and drug coated balloon angioplasty of right popliteal artery. He has no claudication sx's with walking. He and his wife are concerned about a callus that they noticed about a month ago on the lateral aspect of his right foot that has not worsened over the last month, no signs of ischemia at this callus  or the remainder of his feet/legs. His DM is yet uncontrolled with a reported A1C of 9.?, but he states he is working with his PCP to improve his glycemic control. He  quit tobacco use in November 2015.  12/12/15 right LE arterial duplex suggests irregular plaque and mildly elevated peak systolic velocities noted in the proximal superficial femoral artery consistent with a 30-49% diameter reduction. Irregular plaque with mildly elevated peak systolic velocities and a velocity ratio consistent with a 50-74% diameter reduction suggestive of residual stenosis vs restenosis.   The peroneal artery was unable to be visualized.   Significant improvement in right LE ABI compared to 11/04/15: from critical limb ischemia to mild arterial occlusive disease with biphasic waveforms since the 11/08/15 vascular procedures. Right TBI is normal at 0.73. Left ABI remains normal with biphasic waveforms.     PLAN:  Continue graduated walking program.  Based on the patient's vascular studies and examination, and after discussing results with Dr. Donnetta Hutching, pt will return to clinic in 3 months with right LE arterial duplex and ABI's, see Dr. Trula Slade. I advised pt if he has concerns re the circulation in his feet/legs, to notify our office.   I discussed in depth with the patient the nature of atherosclerosis, and emphasized the importance of maximal medical management including strict control of blood pressure, blood glucose, and lipid levels, obtaining regular exercise, and continued cessation of smoking.  The patient is aware that without maximal medical management the underlying atherosclerotic disease process will progress, limiting the benefit of any interventions.  The patient was given information about PAD including signs, symptoms, treatment, what symptoms should prompt the patient to seek immediate medical care, and risk reduction measures to take.  Clemon Chambers, RN, MSN, FNP-C [facility] [employer] Phone: [phone]  Clinic MD: Early  12/13/2015 9:44 AM

## 2015-12-22 ENCOUNTER — Encounter (HOSPITAL_COMMUNITY)
Admission: RE | Admit: 2015-12-22 | Discharge: 2015-12-22 | Disposition: A | Discharge status: Home / Self Care | Payer: Medicare Other | Source: Ambulatory Visit | Attending: Nephrology | Admitting: Nephrology

## 2015-12-22 DIAGNOSIS — D631 Anemia in chronic kidney disease: Secondary | ICD-10-CM | POA: Diagnosis not present

## 2015-12-22 DIAGNOSIS — E1165 Type 2 diabetes mellitus with hyperglycemia: Secondary | ICD-10-CM | POA: Diagnosis not present

## 2015-12-22 DIAGNOSIS — N183 Chronic kidney disease, stage 3 (moderate): Secondary | ICD-10-CM | POA: Insufficient documentation

## 2015-12-22 LAB — POCT HEMOGLOBIN-HEMACUE: Hemoglobin: 11.8 g/dL — ABNORMAL LOW (ref 13.0–17.0)

## 2015-12-22 MED ORDER — DARBEPOETIN ALFA 60 MCG/0.3ML IJ SOSY
60.0000 ug | PREFILLED_SYRINGE | INTRAMUSCULAR | Status: DC
  Administered 2015-12-22: 60 ug via SUBCUTANEOUS

## 2015-12-22 MED ORDER — DARBEPOETIN ALFA 60 MCG/0.3ML IJ SOSY
PREFILLED_SYRINGE | INTRAMUSCULAR | Status: AC
  Filled 2015-12-22: qty 0.3

## 2015-12-28 DIAGNOSIS — N183 Chronic kidney disease, stage 3 (moderate): Secondary | ICD-10-CM | POA: Diagnosis not present

## 2015-12-28 DIAGNOSIS — E1159 Type 2 diabetes mellitus with other circulatory complications: Secondary | ICD-10-CM | POA: Diagnosis not present

## 2015-12-28 DIAGNOSIS — I10 Essential (primary) hypertension: Secondary | ICD-10-CM | POA: Diagnosis not present

## 2015-12-28 DIAGNOSIS — Z6833 Body mass index (BMI) 33.0-33.9, adult: Secondary | ICD-10-CM | POA: Diagnosis not present

## 2015-12-29 ENCOUNTER — Encounter (HOSPITAL_COMMUNITY)
Admission: RE | Admit: 2015-12-29 | Discharge: 2015-12-29 | Disposition: A | Discharge status: Home / Self Care | Payer: Medicare Other | Source: Ambulatory Visit | Attending: Nephrology | Admitting: Nephrology

## 2015-12-29 DIAGNOSIS — D631 Anemia in chronic kidney disease: Secondary | ICD-10-CM | POA: Diagnosis not present

## 2015-12-29 DIAGNOSIS — I472 Ventricular tachycardia: Secondary | ICD-10-CM | POA: Diagnosis not present

## 2015-12-29 DIAGNOSIS — I480 Paroxysmal atrial fibrillation: Secondary | ICD-10-CM | POA: Diagnosis not present

## 2015-12-29 DIAGNOSIS — I25119 Atherosclerotic heart disease of native coronary artery with unspecified angina pectoris: Secondary | ICD-10-CM | POA: Diagnosis not present

## 2015-12-29 DIAGNOSIS — I10 Essential (primary) hypertension: Secondary | ICD-10-CM | POA: Diagnosis not present

## 2015-12-29 LAB — POCT HEMOGLOBIN-HEMACUE: Hemoglobin: 11.9 g/dL — ABNORMAL LOW (ref 13.0–17.0)

## 2015-12-29 MED ORDER — DARBEPOETIN ALFA 60 MCG/0.3ML IJ SOSY
60.0000 ug | PREFILLED_SYRINGE | INTRAMUSCULAR | Status: DC
  Administered 2015-12-29: 60 ug via SUBCUTANEOUS

## 2015-12-29 MED ORDER — DARBEPOETIN ALFA 60 MCG/0.3ML IJ SOSY
PREFILLED_SYRINGE | INTRAMUSCULAR | Status: AC
  Filled 2015-12-29: qty 0.3

## 2016-01-05 ENCOUNTER — Encounter (HOSPITAL_COMMUNITY)
Admission: RE | Admit: 2016-01-05 | Discharge: 2016-01-05 | Disposition: A | Discharge status: Home / Self Care | Payer: Medicare Other | Source: Ambulatory Visit | Attending: Nephrology | Admitting: Nephrology

## 2016-01-05 DIAGNOSIS — D631 Anemia in chronic kidney disease: Secondary | ICD-10-CM | POA: Diagnosis not present

## 2016-01-05 LAB — IRON AND TIBC
IRON: 67 ug/dL (ref 45–182)
SATURATION RATIOS: 24 % (ref 17.9–39.5)
TIBC: 283 ug/dL (ref 250–450)
UIBC: 216 ug/dL

## 2016-01-05 LAB — FERRITIN: FERRITIN: 99 ng/mL (ref 24–336)

## 2016-01-05 LAB — POCT HEMOGLOBIN-HEMACUE: HEMOGLOBIN: 12.3 g/dL — AB (ref 13.0–17.0)

## 2016-01-05 MED ORDER — DARBEPOETIN ALFA 60 MCG/0.3ML IJ SOSY: 60.0000 ug | PREFILLED_SYRINGE | INTRAMUSCULAR | Status: DC

## 2016-01-19 ENCOUNTER — Encounter (HOSPITAL_COMMUNITY)
Admission: RE | Admit: 2016-01-19 | Discharge: 2016-01-19 | Disposition: A | Discharge status: Home / Self Care | Payer: Medicare Other | Source: Ambulatory Visit | Attending: Nephrology | Admitting: Nephrology

## 2016-01-19 DIAGNOSIS — N183 Chronic kidney disease, stage 3 (moderate): Secondary | ICD-10-CM | POA: Insufficient documentation

## 2016-01-19 DIAGNOSIS — D631 Anemia in chronic kidney disease: Secondary | ICD-10-CM | POA: Diagnosis not present

## 2016-01-19 LAB — POCT HEMOGLOBIN-HEMACUE: HEMOGLOBIN: 12.3 g/dL — AB (ref 13.0–17.0)

## 2016-01-19 MED ORDER — DARBEPOETIN ALFA 60 MCG/0.3ML IJ SOSY: 60.0000 ug | PREFILLED_SYRINGE | INTRAMUSCULAR | Status: DC

## 2016-01-26 DIAGNOSIS — R109 Unspecified abdominal pain: Secondary | ICD-10-CM | POA: Diagnosis not present

## 2016-01-26 DIAGNOSIS — I739 Peripheral vascular disease, unspecified: Secondary | ICD-10-CM | POA: Diagnosis not present

## 2016-01-26 DIAGNOSIS — I482 Chronic atrial fibrillation: Secondary | ICD-10-CM | POA: Diagnosis not present

## 2016-01-26 DIAGNOSIS — I10 Essential (primary) hypertension: Secondary | ICD-10-CM | POA: Diagnosis not present

## 2016-01-26 DIAGNOSIS — Z7901 Long term (current) use of anticoagulants: Secondary | ICD-10-CM | POA: Diagnosis not present

## 2016-01-26 DIAGNOSIS — I25810 Atherosclerosis of coronary artery bypass graft(s) without angina pectoris: Secondary | ICD-10-CM | POA: Diagnosis not present

## 2016-01-26 DIAGNOSIS — Z6834 Body mass index (BMI) 34.0-34.9, adult: Secondary | ICD-10-CM | POA: Diagnosis not present

## 2016-01-27 DIAGNOSIS — I472 Ventricular tachycardia: Secondary | ICD-10-CM | POA: Diagnosis not present

## 2016-01-31 DIAGNOSIS — I25119 Atherosclerotic heart disease of native coronary artery with unspecified angina pectoris: Secondary | ICD-10-CM | POA: Diagnosis not present

## 2016-01-31 DIAGNOSIS — I10 Essential (primary) hypertension: Secondary | ICD-10-CM | POA: Diagnosis not present

## 2016-01-31 DIAGNOSIS — I472 Ventricular tachycardia: Secondary | ICD-10-CM | POA: Diagnosis not present

## 2016-01-31 DIAGNOSIS — I480 Paroxysmal atrial fibrillation: Secondary | ICD-10-CM | POA: Diagnosis not present

## 2016-02-02 ENCOUNTER — Encounter (HOSPITAL_COMMUNITY)
Admission: RE | Admit: 2016-02-02 | Discharge: 2016-02-02 | Disposition: A | Discharge status: Home / Self Care | Payer: Medicare Other | Source: Ambulatory Visit | Attending: Nephrology | Admitting: Nephrology

## 2016-02-02 DIAGNOSIS — D631 Anemia in chronic kidney disease: Secondary | ICD-10-CM | POA: Diagnosis not present

## 2016-02-02 LAB — IRON AND TIBC
Iron: 81 ug/dL (ref 45–182)
Saturation Ratios: 30 % (ref 17.9–39.5)
TIBC: 267 ug/dL (ref 250–450)
UIBC: 186 ug/dL

## 2016-02-02 LAB — POCT HEMOGLOBIN-HEMACUE: Hemoglobin: 12.1 g/dL — ABNORMAL LOW (ref 13.0–17.0)

## 2016-02-02 LAB — FERRITIN: Ferritin: 168 ng/mL (ref 24–336)

## 2016-02-02 MED ORDER — DARBEPOETIN ALFA 60 MCG/0.3ML IJ SOSY
60.0000 ug | PREFILLED_SYRINGE | INTRAMUSCULAR | Status: DC
Start: 2016-02-02 — End: 2016-02-03

## 2016-02-16 ENCOUNTER — Encounter (HOSPITAL_COMMUNITY)
Admission: RE | Admit: 2016-02-16 | Discharge: 2016-02-16 | Disposition: A | Discharge status: Home / Self Care | Payer: Medicare Other | Source: Ambulatory Visit | Attending: Nephrology | Admitting: Nephrology

## 2016-02-16 DIAGNOSIS — D631 Anemia in chronic kidney disease: Secondary | ICD-10-CM | POA: Diagnosis not present

## 2016-02-16 DIAGNOSIS — N183 Chronic kidney disease, stage 3 (moderate): Secondary | ICD-10-CM | POA: Insufficient documentation

## 2016-02-16 LAB — POCT HEMOGLOBIN-HEMACUE: HEMOGLOBIN: 11.1 g/dL — AB (ref 13.0–17.0)

## 2016-02-16 MED ORDER — DARBEPOETIN ALFA 60 MCG/0.3ML IJ SOSY
PREFILLED_SYRINGE | INTRAMUSCULAR | Status: AC
  Filled 2016-02-16: qty 0.3

## 2016-02-16 MED ORDER — DARBEPOETIN ALFA 60 MCG/0.3ML IJ SOSY
60.0000 ug | PREFILLED_SYRINGE | INTRAMUSCULAR | Status: DC
  Administered 2016-02-16: 60 ug via SUBCUTANEOUS

## 2016-02-20 DIAGNOSIS — Z95820 Peripheral vascular angioplasty status with implants and grafts: Secondary | ICD-10-CM | POA: Diagnosis not present

## 2016-02-20 DIAGNOSIS — I10 Essential (primary) hypertension: Secondary | ICD-10-CM | POA: Diagnosis not present

## 2016-02-20 DIAGNOSIS — E1151 Type 2 diabetes mellitus with diabetic peripheral angiopathy without gangrene: Secondary | ICD-10-CM | POA: Diagnosis not present

## 2016-02-20 DIAGNOSIS — I1311 Hypertensive heart and chronic kidney disease without heart failure, with stage 5 chronic kidney disease, or end stage renal disease: Secondary | ICD-10-CM | POA: Diagnosis not present

## 2016-02-20 DIAGNOSIS — I25810 Atherosclerosis of coronary artery bypass graft(s) without angina pectoris: Secondary | ICD-10-CM | POA: Diagnosis not present

## 2016-02-20 DIAGNOSIS — Z794 Long term (current) use of insulin: Secondary | ICD-10-CM | POA: Diagnosis not present

## 2016-02-20 DIAGNOSIS — E1159 Type 2 diabetes mellitus with other circulatory complications: Secondary | ICD-10-CM | POA: Diagnosis not present

## 2016-02-20 DIAGNOSIS — I739 Peripheral vascular disease, unspecified: Secondary | ICD-10-CM | POA: Diagnosis not present

## 2016-02-20 DIAGNOSIS — D631 Anemia in chronic kidney disease: Secondary | ICD-10-CM | POA: Diagnosis not present

## 2016-02-20 DIAGNOSIS — N183 Chronic kidney disease, stage 3 (moderate): Secondary | ICD-10-CM | POA: Diagnosis not present

## 2016-02-23 ENCOUNTER — Encounter (HOSPITAL_COMMUNITY)
Admission: RE | Admit: 2016-02-23 | Discharge: 2016-02-23 | Disposition: A | Discharge status: Home / Self Care | Payer: Medicare Other | Source: Ambulatory Visit | Attending: Nephrology | Admitting: Nephrology

## 2016-02-23 DIAGNOSIS — D631 Anemia in chronic kidney disease: Secondary | ICD-10-CM | POA: Diagnosis not present

## 2016-02-23 MED ORDER — DARBEPOETIN ALFA 60 MCG/0.3ML IJ SOSY
PREFILLED_SYRINGE | INTRAMUSCULAR | Status: AC
  Filled 2016-02-23: qty 0.3

## 2016-02-23 MED ORDER — DARBEPOETIN ALFA 60 MCG/0.3ML IJ SOSY
60.0000 ug | PREFILLED_SYRINGE | INTRAMUSCULAR | Status: DC
  Administered 2016-02-23: 60 ug via SUBCUTANEOUS

## 2016-02-24 LAB — POCT HEMOGLOBIN-HEMACUE: HEMOGLOBIN: 11.4 g/dL — AB (ref 13.0–17.0)

## 2016-03-01 ENCOUNTER — Encounter (HOSPITAL_COMMUNITY)
Admission: RE | Admit: 2016-03-01 | Discharge: 2016-03-01 | Disposition: A | Discharge status: Home / Self Care | Payer: Medicare Other | Source: Ambulatory Visit | Attending: Nephrology | Admitting: Nephrology

## 2016-03-01 DIAGNOSIS — D631 Anemia in chronic kidney disease: Secondary | ICD-10-CM | POA: Diagnosis not present

## 2016-03-01 LAB — IRON AND TIBC
Iron: 52 ug/dL (ref 45–182)
Saturation Ratios: 18 % (ref 17.9–39.5)
TIBC: 283 ug/dL (ref 250–450)
UIBC: 231 ug/dL

## 2016-03-01 LAB — POCT HEMOGLOBIN-HEMACUE: HEMOGLOBIN: 12.2 g/dL — AB (ref 13.0–17.0)

## 2016-03-01 LAB — FERRITIN: Ferritin: 100 ng/mL (ref 24–336)

## 2016-03-01 MED ORDER — DARBEPOETIN ALFA 60 MCG/0.3ML IJ SOSY: 60.0000 ug | PREFILLED_SYRINGE | INTRAMUSCULAR | Status: DC

## 2016-03-07 DIAGNOSIS — I25119 Atherosclerotic heart disease of native coronary artery with unspecified angina pectoris: Secondary | ICD-10-CM | POA: Diagnosis not present

## 2016-03-07 DIAGNOSIS — I10 Essential (primary) hypertension: Secondary | ICD-10-CM | POA: Diagnosis not present

## 2016-03-07 DIAGNOSIS — I480 Paroxysmal atrial fibrillation: Secondary | ICD-10-CM | POA: Diagnosis not present

## 2016-03-07 DIAGNOSIS — Z950 Presence of cardiac pacemaker: Secondary | ICD-10-CM | POA: Diagnosis not present

## 2016-03-14 ENCOUNTER — Other Ambulatory Visit (HOSPITAL_COMMUNITY): Payer: Self-pay | Admitting: *Deleted

## 2016-03-15 ENCOUNTER — Encounter (HOSPITAL_COMMUNITY)
Admission: RE | Admit: 2016-03-15 | Discharge: 2016-03-15 | Disposition: A | Discharge status: Home / Self Care | Payer: Medicare Other | Source: Ambulatory Visit | Attending: Nephrology | Admitting: Nephrology

## 2016-03-15 DIAGNOSIS — I480 Paroxysmal atrial fibrillation: Secondary | ICD-10-CM | POA: Diagnosis not present

## 2016-03-15 DIAGNOSIS — N183 Chronic kidney disease, stage 3 (moderate): Secondary | ICD-10-CM | POA: Insufficient documentation

## 2016-03-15 DIAGNOSIS — I471 Supraventricular tachycardia: Secondary | ICD-10-CM | POA: Diagnosis not present

## 2016-03-15 DIAGNOSIS — D631 Anemia in chronic kidney disease: Secondary | ICD-10-CM | POA: Diagnosis not present

## 2016-03-15 LAB — POCT HEMOGLOBIN-HEMACUE: HEMOGLOBIN: 12.5 g/dL — AB (ref 13.0–17.0)

## 2016-03-15 MED ORDER — DARBEPOETIN ALFA 60 MCG/0.3ML IJ SOSY: 60.0000 ug | PREFILLED_SYRINGE | INTRAMUSCULAR | Status: DC

## 2016-03-27 ENCOUNTER — Encounter: Payer: Self-pay | Admitting: Surgery

## 2016-03-27 DIAGNOSIS — E1165 Type 2 diabetes mellitus with hyperglycemia: Secondary | ICD-10-CM | POA: Diagnosis not present

## 2016-03-28 ENCOUNTER — Encounter: Payer: Self-pay | Admitting: Internal Medicine

## 2016-03-28 ENCOUNTER — Ambulatory Visit (INDEPENDENT_AMBULATORY_CARE_PROVIDER_SITE_OTHER): Payer: Medicare Other | Admitting: Internal Medicine

## 2016-03-28 VITALS — BP 168/72 | HR 98 | Ht 72.0 in | Wt 248.0 lb

## 2016-03-28 DIAGNOSIS — I495 Sick sinus syndrome: Secondary | ICD-10-CM | POA: Diagnosis not present

## 2016-03-28 NOTE — Patient Instructions (Signed)
Medication Instructions:  Your physician recommends that you continue on your current medications as directed. Please refer to the Current Medication list given to you today.  Labwork: None ordered.  Testing/Procedures: None ordered.  Follow-Up: Your physician recommends that you schedule a follow-up appointment as needed.   Any Other Special Instructions Will Be Listed Below (If Applicable).     If you need a refill on your cardiac medications before your next appointment, please call your pharmacy.   

## 2016-03-29 ENCOUNTER — Encounter (HOSPITAL_COMMUNITY)
Admission: RE | Admit: 2016-03-29 | Discharge: 2016-03-29 | Disposition: A | Discharge status: Home / Self Care | Payer: Medicare Other | Source: Ambulatory Visit | Attending: Nephrology | Admitting: Nephrology

## 2016-03-29 DIAGNOSIS — D631 Anemia in chronic kidney disease: Secondary | ICD-10-CM | POA: Diagnosis not present

## 2016-03-29 DIAGNOSIS — N183 Chronic kidney disease, stage 3 (moderate): Secondary | ICD-10-CM | POA: Diagnosis not present

## 2016-03-29 LAB — IRON AND TIBC
Iron: 100 ug/dL (ref 45–182)
SATURATION RATIOS: 39 % (ref 17.9–39.5)
TIBC: 256 ug/dL (ref 250–450)
UIBC: 156 ug/dL

## 2016-03-29 LAB — CUP PACEART INCLINIC DEVICE CHECK
Battery Remaining Longevity: 104 mo
Battery Voltage: 3.02 V
Brady Statistic AP VP Percent: 0.09 %
Brady Statistic AS VS Percent: 13.49 %
Brady Statistic RV Percent Paced: 7.6 %
Implantable Lead Implant Date: 20151127
Implantable Lead Location: 753860
Lead Channel Impedance Value: 361 Ohm
Lead Channel Impedance Value: 418 Ohm
Lead Channel Pacing Threshold Amplitude: 0.75 V
Lead Channel Sensing Intrinsic Amplitude: 3.5 mV
Lead Channel Setting Pacing Amplitude: 1.75 V
Lead Channel Setting Sensing Sensitivity: 2.8 mV
MDC IDC LEAD IMPLANT DT: 20151127
MDC IDC LEAD LOCATION: 753859
MDC IDC MSMT LEADCHNL RA IMPEDANCE VALUE: 323 Ohm
MDC IDC MSMT LEADCHNL RA IMPEDANCE VALUE: 399 Ohm
MDC IDC MSMT LEADCHNL RA PACING THRESHOLD PULSEWIDTH: 0.4 ms
MDC IDC MSMT LEADCHNL RA SENSING INTR AMPL: 3.75 mV
MDC IDC MSMT LEADCHNL RV PACING THRESHOLD AMPLITUDE: 0.75 V
MDC IDC MSMT LEADCHNL RV PACING THRESHOLD PULSEWIDTH: 0.4 ms
MDC IDC MSMT LEADCHNL RV SENSING INTR AMPL: 10.125 mV
MDC IDC MSMT LEADCHNL RV SENSING INTR AMPL: 7 mV
MDC IDC SESS DTM: 20170517195413
MDC IDC SET LEADCHNL RV PACING AMPLITUDE: 2.5 V
MDC IDC SET LEADCHNL RV PACING PULSEWIDTH: 0.4 ms
MDC IDC STAT BRADY AP VS PERCENT: 78.92 %
MDC IDC STAT BRADY AS VP PERCENT: 7.5 %
MDC IDC STAT BRADY RA PERCENT PACED: 79.01 %

## 2016-03-29 LAB — FERRITIN: Ferritin: 182 ng/mL (ref 24–336)

## 2016-03-29 LAB — POCT HEMOGLOBIN-HEMACUE: Hemoglobin: 11.2 g/dL — ABNORMAL LOW (ref 13.0–17.0)

## 2016-03-29 MED ORDER — DARBEPOETIN ALFA 60 MCG/0.3ML IJ SOSY
60.0000 ug | PREFILLED_SYRINGE | INTRAMUSCULAR | Status: DC
  Administered 2016-03-29: 60 ug via SUBCUTANEOUS

## 2016-03-29 MED ORDER — DARBEPOETIN ALFA 60 MCG/0.3ML IJ SOSY
PREFILLED_SYRINGE | INTRAMUSCULAR | Status: AC
  Filled 2016-03-29: qty 0.3

## 2016-03-29 NOTE — Progress Notes (Signed)
HPI Alexander [name] is referred today for ongoing PPM followup. He is a pleasant 55 yo man with symptomatic sinus node dysfunction, s/p PPM insertion by Dr. Renaldo Reel, HTN and obesity. In the interim, he has been followed by Dr. [name] and recently noted to have an increase in paroxysmal atrial arrhythmias, most of which represents atrial fibrillation. He has been started on amiodarone and has been feeling fairly well. The patient does have clauidication and severe peripheral vascular disease and is s/p transmetatarsal amputation. No frank syncope.  Allergies  Allergen Reactions  . Sulfa Antibiotics Rash     Current Outpatient Prescriptions  Medication Sig Dispense Refill  . ACCU-CHEK FASTCLIX LANCETS MISC Check blood sugar TID & QHS 102 each 2  . amiodarone (PACERONE) 400 MG tablet Take 0.5 tablets (200 mg total) by mouth daily. 60 tablet 0  . amLODipine (NORVASC) 10 MG tablet Take 1 tablet (10 mg total) by mouth daily. 30 tablet 2  . apixaban (ELIQUIS) 5 MG TABS tablet Take 1 tablet (5 mg total) by mouth 2 (two) times daily. 180 tablet 3  . aspirin EC 81 MG tablet Take 81 mg by mouth daily.    Marland Kitchen atorvastatin (LIPITOR) 20 MG tablet Take 20 tablets by mouth daily.  2  . blood glucose meter kit and supplies KIT Check blood sugar TID & QHS 1 each 0  . Blood Glucose Monitoring Suppl (ACCU-CHEK ADVANTAGE DIABETES) kit Use as instructed 1 each 0  . chlorthalidone (HYGROTON) 25 MG tablet Take 25 mg by mouth 2 (two) times daily.     . chlorthalidone (HYGROTON) 50 MG tablet Take 1 tablet by mouth daily.  3  . cloNIDine (CATAPRES - DOSED IN MG/24 HR) 0.2 mg/24hr patch Place 1 patch onto the skin once a week.  0  . clopidogrel (PLAVIX) 75 MG tablet Take 1 tablet (75 mg total) by mouth daily with breakfast. 60 tablet 6  . GlucoCom Lancets MISC Check blood sugar TID & QHS 100 each 0  . glucose blood (ACCU-CHEK AVIVA PLUS) test strip Use as instructed 100 each 12  . glucose blood (CHOICE DM FORA G20 TEST  STRIPS) test strip Check blood sugar TID & QHS 100 each 12  . glucose blood test strip Use as instructed 100 each 12  . hydrALAZINE (APRESOLINE) 100 MG tablet Take 100 mg by mouth 3 (three) times daily.    . Insulin Lispro Prot & Lispro (HUMALOG MIX 75/25 KWIKPEN) (75-25) 100 UNIT/ML Kwikpen Inject 30-40 Units into the skin 2 (two) times daily with a meal. Inject 40 units subcutaneously before breakfast and 30 units before supper    . metoprolol (LOPRESSOR) 100 MG tablet Take 100 mg by mouth 2 (two) times daily.    . Multiple Vitamin (MULTIVITAMIN WITH MINERALS) TABS tablet Take 1 tablet by mouth daily.     No current facility-administered medications for this visit.   Facility-Administered Medications Ordered in Other Visits  Medication Dose Route Frequency Provider Last Rate Last Dose  . Darbepoetin Alfa (ARANESP) injection 60 mcg  60 mcg Subcutaneous Weekly Edrick Oh, MD   60 mcg at 03/29/16 9485     Past Medical History  Diagnosis Date  . Hypertension   . Arthritis   . Diabetes mellitus     dx---been  awhile  . Anemia   . Alcohol abuse   . Chronic kidney disease   . Hyperlipemia   . Cataract     BILATERAL  . Cardiac syncope 10/07/14  rapid A fib with 8 sec pauses on converison with syncope- temp pacing wire placed then PPM  . Atrial fibrillation ([place])   . Coronary artery disease   . Presence of permanent cardiac pacemaker 10/08/2014  . Peripheral vascular disease ([place])     ROS:   All systems reviewed and negative except as noted in the HPI.   Past Surgical History  Procedure Laterality Date  . Lumbar laminectomy  06/13/2012    Procedure: MICRODISCECTOMY LUMBAR LAMINECTOMY;  Surgeon: Jessy Oto, MD;  Location: [place];  Service: Orthopedics;  Laterality: N/A;  Central laminectomy L2-3, L3-4, L4-5  . Amputation Left 08/19/2013    Procedure: AMPUTATION RAY;  Surgeon: Alta Corning, MD;  Location: [place];  Service: Orthopedics;  Laterality: Left;  ray amputation left 5th   . Femoral-tibial bypass graft Left 10/27/2013    Procedure: BYPASS GRAFT LEFT FEMORAL- POSTERIOR TIBIAL ARTERY;  Surgeon: Angelia Mould, MD;  Location: [place];  Service: Vascular;  Laterality: Left;  . I&d extremity Left 10/27/2013    Procedure: IRRIGATION AND DEBRIDEMENT EXTREMITY- LEFT FOOT;  Surgeon: Angelia Mould, MD;  Location: [place];  Service: Vascular;  Laterality: Left;  . Amputation Left 10/27/2013    Procedure: AMPUTATION DIGIT-LEFT 4TH TOE, 4th and 5th metatarsal.;  Surgeon: Angelia Mould, MD;  Location: [place];  Service: Vascular;  Laterality: Left;  . Amputation Left 11/04/2013    Procedure: LEFT FOOT TRANS-METATARSAL AMPUTATION WITH WOUND CLOSURE ;  Surgeon: Alta Corning, MD;  Location: [place];  Service: Orthopedics;  Laterality: Left;  . Pacemaker insertion  10/08/14    MDT Advisa MRI compatible dual chamber pacemaker implanted by Dr Caryl Comes for syncope with post-termination pauses  . Temporary pacemaker insertion Bilateral 10/07/2014    Procedure: TEMPORARY PACEMAKER INSERTION;  Surgeon: Laverda Page, MD;  Location: [facility] CATH LAB;  Service: Cardiovascular;  Laterality: Bilateral;  . Cardiac catheterization  10/07/2014    Procedure: LEFT HEART CATH AND CORONARY ANGIOGRAPHY;  Surgeon: Laverda Page, MD;  Location: [facility] CATH LAB;  Service: Cardiovascular;;  . Permanent pacemaker insertion N/A 10/08/2014    Procedure: PERMANENT PACEMAKER INSERTION;  Surgeon: Deboraha Sprang, MD;  Location[facility] CATH LAB;  Service: Cardiovascular;  Laterality: N/A;  . Percutaneous coronary stent intervention (pci-s)  11/09/2014    des to lad & distal circumflex         Dr  [name]  . Left heart catheterization with coronary angiogram N/A 11/09/2014    Procedure: LEFT HEART CATHETERIZATION WITH CORONARY ANGIOGRAM;  Surgeon: Laverda Page, MD;  Location: [facility] CATH LAB;  Service: Cardiovascular;  Laterality: N/A;  . Peripheral vascular catheterization N/A 11/08/2015    Procedure:  Abdominal Aortogram;  Surgeon: Serafina Mitchell, MD;  Location: [place] CV LAB;  Service: Cardiovascular;  Laterality: N/A;  . Lower extremity angiogram Right 11/08/2015    Procedure: Lower Extremity Angiogram;  Surgeon: Serafina Mitchell, MD;  Location: [place] CV LAB;  Service: Cardiovascular;  Laterality: Right;  . Peripheral vascular catheterization Right 11/08/2015    Procedure: Peripheral Vascular Atherectomy;  Surgeon: Serafina Mitchell, MD;  Location: [place] CV LAB;  Service: Cardiovascular;  Laterality: Right;     Family History  Problem Relation Age of Onset  . Diabetes type II Mother   . Hypertension Mother   . Diabetes Mother   . Liver cancer Father   . Diabetes type II Sister   . Breast cancer Sister   . Diabetes  Sister   . Hypertension Sister   . Diabetes type II Brother   . Kidney failure Brother   . Diabetes Brother   . Hypertension Brother   . Diabetes type II Sister      Social History   Social History  . Marital Status: Married    Spouse Name: N/A  . Number of Children: 2  . Years of Education: N/A   Occupational History  . Disabled    Social History Main Topics  . Smoking status: Former Smoker -- 1.50 packs/day for 30 years    Types: Cigars    Quit date: 10/07/2014  . Smokeless tobacco: Never Used  . Alcohol Use: 21.0 oz/week    35 Cans of beer per week     Comment: 6 of 12 oz beers a day  . Drug Use: No  . Sexual Activity: Not on file   Other Topics Concern  . Not on file   Social History Narrative     BP 168/72 mmHg  Pulse 98  Ht 6' (1.829 m)  Wt 248 lb (112.492 kg)  BMI 33.63 kg/m2  Physical Exam:  obese appearing 55 yo man, NAD HEENT: Unremarkable Neck:  6 cm JVD, no thyromegally Lymphatics:  No adenopathy Back:  No CVA tenderness Lungs:  Clear with decreased breath sounds. HEART:  Regular rate rhythm, no murmurs, no rubs, no clicks Abd:  soft, positive bowel sounds, no organomegally, no rebound, no guarding Ext:   2 plus pulses, no edema, no cyanosis, no clubbing Skin:  No rashes no nodules Neuro:  CN II through XII intact, motor grossly intact  EKG -nsr  DEVICE  Normal device function.  See PaceArt for details. Essentially no atrial fib in the past months  Assess/Plan: 1. PAF - he is maintaining nsr on amio 200 mg daily. Continue. 2. Obesity - he is encouraged to lose weight. Obese patient's with atrial fib have more atrial fib. 3. HTN - his blood pressure is elevated and will be difficult to control with his lifestyle and diet.  4. PPM - his medtronic DDD PM is working normally. His atrial fib is controlled.   Alexander [name].D.

## 2016-04-02 ENCOUNTER — Other Ambulatory Visit: Payer: Self-pay | Admitting: Family

## 2016-04-02 ENCOUNTER — Ambulatory Visit (INDEPENDENT_AMBULATORY_CARE_PROVIDER_SITE_OTHER): Payer: Medicare Other | Admitting: Surgery

## 2016-04-02 ENCOUNTER — Ambulatory Visit (INDEPENDENT_AMBULATORY_CARE_PROVIDER_SITE_OTHER)
Admission: RE | Admit: 2016-04-02 | Discharge: 2016-04-02 | Disposition: A | Discharge status: Home / Self Care | Payer: Medicare Other | Source: Ambulatory Visit | Attending: Surgery | Admitting: Surgery

## 2016-04-02 ENCOUNTER — Ambulatory Visit (HOSPITAL_COMMUNITY)
Admission: RE | Admit: 2016-04-02 | Discharge: 2016-04-02 | Disposition: A | Discharge status: Home / Self Care | Payer: Medicare Other | Source: Ambulatory Visit | Attending: Surgery | Admitting: Surgery

## 2016-04-02 ENCOUNTER — Encounter: Payer: Self-pay | Admitting: Surgery

## 2016-04-02 VITALS — BP 144/72 | HR 65 | Temp 97.7°F | Resp 16 | Wt 249.0 lb

## 2016-04-02 DIAGNOSIS — E119 Type 2 diabetes mellitus without complications: Secondary | ICD-10-CM | POA: Diagnosis not present

## 2016-04-02 DIAGNOSIS — Z48812 Encounter for surgical aftercare following surgery on the circulatory system: Secondary | ICD-10-CM

## 2016-04-02 DIAGNOSIS — Z95828 Presence of other vascular implants and grafts: Secondary | ICD-10-CM | POA: Diagnosis not present

## 2016-04-02 DIAGNOSIS — I779 Disorder of arteries and arterioles, unspecified: Secondary | ICD-10-CM | POA: Diagnosis not present

## 2016-04-02 DIAGNOSIS — I70201 Unspecified atherosclerosis of native arteries of extremities, right leg: Secondary | ICD-10-CM | POA: Diagnosis not present

## 2016-04-02 DIAGNOSIS — I10 Essential (primary) hypertension: Secondary | ICD-10-CM | POA: Insufficient documentation

## 2016-04-02 NOTE — Progress Notes (Signed)
[facility]  Patient name: Alexander [name] MRN: [id number] DOB: October 12, 1961 Sex: male  REASON FOR VISIT: follow up  HPI: Alexander [name] is a 55 y.o. male who is back for follow-up.  He is status post left femoral to tibioperoneal trunk bypass on 10/21/2013 by Dr. Scot Dock.  This was done for a nonhealing wound which ultimately required a transmetatarsal amputation performed by Dr. Berenice Primas.  The patient also had a wound on his right leg and on 11/08/2015 I performed atherectomy with drug coated balloon angioplasty for a tandem above-the-knee popliteal stenosis.  At his first postoperative visit, he was found to have elevated velocities within the treated segment.  A 3 month ultrasound was recommended.  He is back today for follow-up.  He has no open wounds.  He has no claudication symptoms.  Past Medical History  Diagnosis Date  . Hypertension   . Arthritis   . Diabetes mellitus     dx---been  awhile  . Anemia   . Alcohol abuse   . Chronic kidney disease   . Hyperlipemia   . Cataract     BILATERAL  . Cardiac syncope 10/07/14    rapid A fib with 8 sec pauses on converison with syncope- temp pacing wire placed then PPM  . Atrial fibrillation ([place])   . Coronary artery disease   . Presence of permanent cardiac pacemaker 10/08/2014  . Peripheral vascular disease ([place])     Family History  Problem Relation Age of Onset  . Diabetes type II Mother   . Hypertension Mother   . Diabetes Mother   . Liver cancer Father   . Diabetes type II Sister   . Breast cancer Sister   . Diabetes Sister   . Hypertension Sister   . Diabetes type II Brother   . Kidney failure Brother   . Diabetes Brother   . Hypertension Brother   . Diabetes type II Sister     SOCIAL HISTORY: Social History  Substance Use Topics  . Smoking status: Former Smoker -- 1.50 packs/day for 30 years    Types: Cigars    Quit date: 10/07/2014  . Smokeless tobacco: Never Used  . Alcohol Use:  21.0 oz/week    35 Cans of beer per week     Comment: 6 of 12 oz beers a day    Allergies  Allergen Reactions  . Sulfa Antibiotics Rash    Current Outpatient Prescriptions  Medication Sig Dispense Refill  . ACCU-CHEK FASTCLIX LANCETS MISC Check blood sugar TID & QHS 102 each 2  . amiodarone (PACERONE) 400 MG tablet Take 0.5 tablets (200 mg total) by mouth daily. 60 tablet 0  . amLODipine (NORVASC) 10 MG tablet Take 1 tablet (10 mg total) by mouth daily. 30 tablet 2  . apixaban (ELIQUIS) 5 MG TABS tablet Take 1 tablet (5 mg total) by mouth 2 (two) times daily. 180 tablet 3  . aspirin EC 81 MG tablet Take 81 mg by mouth daily.    Marland Kitchen atorvastatin (LIPITOR) 20 MG tablet Take 20 tablets by mouth daily.  2  . blood glucose meter kit and supplies KIT Check blood sugar TID & QHS 1 each 0  . Blood Glucose Monitoring Suppl (ACCU-CHEK ADVANTAGE DIABETES) kit Use as instructed 1 each 0  . chlorthalidone (HYGROTON) 25 MG tablet Take 25 mg by mouth 2 (two) times daily.     . cloNIDine (CATAPRES - DOSED IN MG/24 HR) 0.2 mg/24hr patch Place  1 patch onto the skin once a week.  0  . clopidogrel (PLAVIX) 75 MG tablet Take 1 tablet (75 mg total) by mouth daily with breakfast. 60 tablet 6  . GlucoCom Lancets MISC Check blood sugar TID & QHS 100 each 0  . glucose blood (ACCU-CHEK AVIVA PLUS) test strip Use as instructed 100 each 12  . glucose blood (CHOICE DM FORA G20 TEST STRIPS) test strip Check blood sugar TID & QHS 100 each 12  . glucose blood test strip Use as instructed 100 each 12  . hydrALAZINE (APRESOLINE) 100 MG tablet Take 100 mg by mouth 3 (three) times daily.    . Insulin Lispro Prot & Lispro (HUMALOG MIX 75/25 KWIKPEN) (75-25) 100 UNIT/ML Kwikpen Inject 30-40 Units into the skin 2 (two) times daily with a meal. Inject 40 units subcutaneously before breakfast and 30 units before supper    . metoprolol (LOPRESSOR) 100 MG tablet Take 100 mg by mouth 2 (two) times daily.    . Multiple Vitamin  (MULTIVITAMIN WITH MINERALS) TABS tablet Take 1 tablet by mouth daily.     No current facility-administered medications for this visit.    REVIEW OF SYSTEMS:  [X] denotes positive finding, [ ] denotes negative finding Cardiac  Comments:  Chest pain or chest pressure:    Shortness of breath upon exertion:    Short of breath when lying flat:    Irregular heart rhythm:        Vascular    Pain in calf, thigh, or hip brought on by ambulation:    Pain in feet at night that wakes you up from your sleep:     Blood clot in your veins:    Leg swelling:         Pulmonary    Oxygen at home:    Productive cough:     Wheezing:         Neurologic    Sudden weakness in arms or legs:     Sudden numbness in arms or legs:     Sudden onset of difficulty speaking or slurred speech:    Temporary loss of vision in one eye:     Problems with dizziness:         Gastrointestinal    Blood in stool:     Vomited blood:         Genitourinary    Burning when urinating:     Blood in urine:        Psychiatric    Major depression:         Hematologic    Bleeding problems:    Problems with blood clotting too easily:        Skin    Rashes or ulcers:        Constitutional    Fever or chills:      PHYSICAL EXAM: Filed Vitals:   04/02/16 1147 04/02/16 1148  BP: 146/79 144/72  Pulse: 65 65  Temp: 97.7 F (36.5 C)   TempSrc: Oral   Resp: 16   Weight: 249 lb (112.946 kg)   SpO2: 99%     GENERAL: The patient is a well-nourished male, in no acute distress. The vital signs are documented above. CARDIAC: There is a regular rate and rhythm.  VASCULAR: Nonpalpable pedal pulse PULMONARY: There is good air exchange bilaterally without wheezing or rales. ABDOMEN: Soft and non-tender with normal pitched bowel sounds.  MUSCULOSKELETAL: There are no major deformities or cyanosis. NEUROLOGIC: No focal weakness or   paresthesias are detected. SKIN: There are no ulcers or rashes noted. PSYCHIATRIC:  The patient has a normal affect.  DATA:  Duplex ultrasound was ordered and reviewed.  ABI on the right is 0.91.  On the left is 1.06.  The treated area on the right superficial femoral artery has a velocity elevation of 295.  This had increased from [place]: Status post bilateral lower extremity revascularization.  We are following a recurrent stenosis within the percutaneously treated right leg.  He does not meet criteria for intervention at this time.  I think this needs to be closely monitored as he will likely need an intervention in the future.  I will have a follow-up in 6 months with bilateral lower extremity duplex.    Annamarie Major MD [facility] [place] Tel: [phone] Pager: [phone]

## 2016-04-05 ENCOUNTER — Encounter (HOSPITAL_COMMUNITY)
Admission: RE | Admit: 2016-04-05 | Discharge: 2016-04-05 | Disposition: A | Discharge status: Home / Self Care | Payer: Medicare Other | Source: Ambulatory Visit | Attending: Nephrology | Admitting: Nephrology

## 2016-04-05 DIAGNOSIS — I25119 Atherosclerotic heart disease of native coronary artery with unspecified angina pectoris: Secondary | ICD-10-CM | POA: Diagnosis not present

## 2016-04-05 DIAGNOSIS — I480 Paroxysmal atrial fibrillation: Secondary | ICD-10-CM | POA: Diagnosis not present

## 2016-04-05 DIAGNOSIS — D631 Anemia in chronic kidney disease: Secondary | ICD-10-CM | POA: Diagnosis not present

## 2016-04-05 DIAGNOSIS — I10 Essential (primary) hypertension: Secondary | ICD-10-CM | POA: Diagnosis not present

## 2016-04-05 DIAGNOSIS — I739 Peripheral vascular disease, unspecified: Secondary | ICD-10-CM | POA: Diagnosis not present

## 2016-04-05 LAB — POCT HEMOGLOBIN-HEMACUE: Hemoglobin: 11.9 g/dL — ABNORMAL LOW (ref 13.0–17.0)

## 2016-04-05 MED ORDER — DARBEPOETIN ALFA 60 MCG/0.3ML IJ SOSY
PREFILLED_SYRINGE | INTRAMUSCULAR | Status: AC
  Filled 2016-04-05: qty 0.3

## 2016-04-05 MED ORDER — DARBEPOETIN ALFA 60 MCG/0.3ML IJ SOSY
60.0000 ug | PREFILLED_SYRINGE | INTRAMUSCULAR | Status: DC
  Administered 2016-04-05: 60 ug via SUBCUTANEOUS

## 2016-04-12 ENCOUNTER — Encounter (HOSPITAL_COMMUNITY)
Admission: RE | Admit: 2016-04-12 | Discharge: 2016-04-12 | Disposition: A | Discharge status: Home / Self Care | Payer: Medicare Other | Source: Ambulatory Visit | Attending: Nephrology | Admitting: Nephrology

## 2016-04-12 DIAGNOSIS — N183 Chronic kidney disease, stage 3 (moderate): Secondary | ICD-10-CM | POA: Insufficient documentation

## 2016-04-12 DIAGNOSIS — D631 Anemia in chronic kidney disease: Secondary | ICD-10-CM | POA: Diagnosis not present

## 2016-04-12 LAB — POCT HEMOGLOBIN-HEMACUE: HEMOGLOBIN: 11.8 g/dL — AB (ref 13.0–17.0)

## 2016-04-12 MED ORDER — DARBEPOETIN ALFA 60 MCG/0.3ML IJ SOSY
PREFILLED_SYRINGE | INTRAMUSCULAR | Status: AC
  Filled 2016-04-12: qty 0.3

## 2016-04-12 MED ORDER — DARBEPOETIN ALFA 60 MCG/0.3ML IJ SOSY
60.0000 ug | PREFILLED_SYRINGE | INTRAMUSCULAR | Status: DC
  Administered 2016-04-12: 60 ug via SUBCUTANEOUS

## 2016-04-19 ENCOUNTER — Encounter (HOSPITAL_COMMUNITY)
Admission: RE | Admit: 2016-04-19 | Discharge: 2016-04-19 | Disposition: A | Discharge status: Home / Self Care | Payer: Medicare Other | Source: Ambulatory Visit | Attending: Nephrology | Admitting: Nephrology

## 2016-04-19 DIAGNOSIS — D631 Anemia in chronic kidney disease: Secondary | ICD-10-CM | POA: Diagnosis not present

## 2016-04-19 LAB — POCT HEMOGLOBIN-HEMACUE: HEMOGLOBIN: 12.6 g/dL — AB (ref 13.0–17.0)

## 2016-04-19 MED ORDER — DARBEPOETIN ALFA 60 MCG/0.3ML IJ SOSY: 60.0000 ug | PREFILLED_SYRINGE | INTRAMUSCULAR | Status: DC

## 2016-05-03 ENCOUNTER — Encounter (HOSPITAL_COMMUNITY)
Admission: RE | Admit: 2016-05-03 | Discharge: 2016-05-03 | Disposition: A | Discharge status: Home / Self Care | Payer: Medicare Other | Source: Ambulatory Visit | Attending: Nephrology | Admitting: Nephrology

## 2016-05-03 DIAGNOSIS — D631 Anemia in chronic kidney disease: Secondary | ICD-10-CM | POA: Diagnosis not present

## 2016-05-03 LAB — IRON AND TIBC
IRON: 88 ug/dL (ref 45–182)
SATURATION RATIOS: 33 % (ref 17.9–39.5)
TIBC: 270 ug/dL (ref 250–450)
UIBC: 182 ug/dL

## 2016-05-03 LAB — FERRITIN: Ferritin: 156 ng/mL (ref 24–336)

## 2016-05-03 LAB — POCT HEMOGLOBIN-HEMACUE: HEMOGLOBIN: 11.8 g/dL — AB (ref 13.0–17.0)

## 2016-05-03 MED ORDER — DARBEPOETIN ALFA 60 MCG/0.3ML IJ SOSY
60.0000 ug | PREFILLED_SYRINGE | INTRAMUSCULAR | Status: DC
  Administered 2016-05-03: 60 ug via SUBCUTANEOUS

## 2016-05-03 MED ORDER — DARBEPOETIN ALFA 60 MCG/0.3ML IJ SOSY
PREFILLED_SYRINGE | INTRAMUSCULAR | Status: AC
  Filled 2016-05-03: qty 0.3

## 2016-05-10 ENCOUNTER — Encounter (HOSPITAL_COMMUNITY)
Admission: RE | Admit: 2016-05-10 | Discharge: 2016-05-10 | Disposition: A | Discharge status: Home / Self Care | Payer: Medicare Other | Source: Ambulatory Visit | Attending: Nephrology | Admitting: Nephrology

## 2016-05-10 DIAGNOSIS — D631 Anemia in chronic kidney disease: Secondary | ICD-10-CM | POA: Diagnosis not present

## 2016-05-10 LAB — POCT HEMOGLOBIN-HEMACUE: Hemoglobin: 12.6 g/dL — ABNORMAL LOW (ref 13.0–17.0)

## 2016-05-10 MED ORDER — DARBEPOETIN ALFA 60 MCG/0.3ML IJ SOSY: 60.0000 ug | PREFILLED_SYRINGE | INTRAMUSCULAR | Status: DC

## 2016-05-22 DIAGNOSIS — H25812 Combined forms of age-related cataract, left eye: Secondary | ICD-10-CM | POA: Diagnosis not present

## 2016-05-22 DIAGNOSIS — H25811 Combined forms of age-related cataract, right eye: Secondary | ICD-10-CM | POA: Diagnosis not present

## 2016-05-22 DIAGNOSIS — E113393 Type 2 diabetes mellitus with moderate nonproliferative diabetic retinopathy without macular edema, bilateral: Secondary | ICD-10-CM | POA: Diagnosis not present

## 2016-05-24 ENCOUNTER — Encounter (HOSPITAL_COMMUNITY)
Admission: RE | Admit: 2016-05-24 | Discharge: 2016-05-24 | Disposition: A | Discharge status: Home / Self Care | Payer: Medicare Other | Source: Ambulatory Visit | Attending: Nephrology | Admitting: Nephrology

## 2016-05-24 DIAGNOSIS — D631 Anemia in chronic kidney disease: Secondary | ICD-10-CM | POA: Insufficient documentation

## 2016-05-24 DIAGNOSIS — N183 Chronic kidney disease, stage 3 (moderate): Secondary | ICD-10-CM | POA: Insufficient documentation

## 2016-05-24 LAB — POCT HEMOGLOBIN-HEMACUE: Hemoglobin: 12.1 g/dL — ABNORMAL LOW (ref 13.0–17.0)

## 2016-05-24 MED ORDER — DARBEPOETIN ALFA 60 MCG/0.3ML IJ SOSY: 60.0000 ug | PREFILLED_SYRINGE | INTRAMUSCULAR | Status: DC

## 2016-05-25 ENCOUNTER — Encounter (HOSPITAL_COMMUNITY): Payer: Medicare Other

## 2016-06-06 DIAGNOSIS — I739 Peripheral vascular disease, unspecified: Secondary | ICD-10-CM | POA: Diagnosis not present

## 2016-06-06 DIAGNOSIS — E1151 Type 2 diabetes mellitus with diabetic peripheral angiopathy without gangrene: Secondary | ICD-10-CM | POA: Diagnosis not present

## 2016-06-06 DIAGNOSIS — N183 Chronic kidney disease, stage 3 (moderate): Secondary | ICD-10-CM | POA: Diagnosis not present

## 2016-06-06 DIAGNOSIS — D631 Anemia in chronic kidney disease: Secondary | ICD-10-CM | POA: Diagnosis not present

## 2016-06-06 DIAGNOSIS — Z794 Long term (current) use of insulin: Secondary | ICD-10-CM | POA: Diagnosis not present

## 2016-06-06 DIAGNOSIS — E7800 Pure hypercholesterolemia, unspecified: Secondary | ICD-10-CM | POA: Diagnosis not present

## 2016-06-06 DIAGNOSIS — Z95820 Peripheral vascular angioplasty status with implants and grafts: Secondary | ICD-10-CM | POA: Diagnosis not present

## 2016-06-06 DIAGNOSIS — I130 Hypertensive heart and chronic kidney disease with heart failure and stage 1 through stage 4 chronic kidney disease, or unspecified chronic kidney disease: Secondary | ICD-10-CM | POA: Diagnosis not present

## 2016-06-06 DIAGNOSIS — Z6832 Body mass index (BMI) 32.0-32.9, adult: Secondary | ICD-10-CM | POA: Diagnosis not present

## 2016-06-06 DIAGNOSIS — I10 Essential (primary) hypertension: Secondary | ICD-10-CM | POA: Diagnosis not present

## 2016-06-07 ENCOUNTER — Encounter (HOSPITAL_COMMUNITY)
Admission: RE | Admit: 2016-06-07 | Discharge: 2016-06-07 | Disposition: A | Discharge status: Home / Self Care | Payer: Medicare Other | Source: Ambulatory Visit | Attending: Nephrology | Admitting: Nephrology

## 2016-06-07 DIAGNOSIS — E1122 Type 2 diabetes mellitus with diabetic chronic kidney disease: Secondary | ICD-10-CM

## 2016-06-07 DIAGNOSIS — N183 Chronic kidney disease, stage 3 unspecified: Secondary | ICD-10-CM

## 2016-06-07 DIAGNOSIS — D631 Anemia in chronic kidney disease: Secondary | ICD-10-CM | POA: Diagnosis not present

## 2016-06-07 LAB — FERRITIN: FERRITIN: 205 ng/mL (ref 24–336)

## 2016-06-07 LAB — IRON AND TIBC
IRON: 84 ug/dL (ref 45–182)
SATURATION RATIOS: 32 % (ref 17.9–39.5)
TIBC: 263 ug/dL (ref 250–450)
UIBC: 179 ug/dL

## 2016-06-07 LAB — POCT HEMOGLOBIN-HEMACUE: Hemoglobin: 11.1 g/dL — ABNORMAL LOW (ref 13.0–17.0)

## 2016-06-07 MED ORDER — DARBEPOETIN ALFA 60 MCG/0.3ML IJ SOSY
PREFILLED_SYRINGE | INTRAMUSCULAR | Status: AC
  Filled 2016-06-07: qty 0.3

## 2016-06-07 MED ORDER — DARBEPOETIN ALFA 60 MCG/0.3ML IJ SOSY
60.0000 ug | PREFILLED_SYRINGE | INTRAMUSCULAR | Status: DC
  Administered 2016-06-07: 60 ug via SUBCUTANEOUS

## 2016-06-08 DIAGNOSIS — I10 Essential (primary) hypertension: Secondary | ICD-10-CM | POA: Diagnosis not present

## 2016-06-08 DIAGNOSIS — I739 Peripheral vascular disease, unspecified: Secondary | ICD-10-CM | POA: Diagnosis not present

## 2016-06-08 DIAGNOSIS — I480 Paroxysmal atrial fibrillation: Secondary | ICD-10-CM | POA: Diagnosis not present

## 2016-06-08 DIAGNOSIS — I25119 Atherosclerotic heart disease of native coronary artery with unspecified angina pectoris: Secondary | ICD-10-CM | POA: Diagnosis not present

## 2016-06-12 ENCOUNTER — Other Ambulatory Visit: Payer: Self-pay | Admitting: Surgery

## 2016-06-12 DIAGNOSIS — I779 Disorder of arteries and arterioles, unspecified: Secondary | ICD-10-CM

## 2016-06-14 ENCOUNTER — Encounter (HOSPITAL_COMMUNITY)
Admission: RE | Admit: 2016-06-14 | Discharge: 2016-06-14 | Disposition: A | Discharge status: Home / Self Care | Payer: Medicare Other | Source: Ambulatory Visit | Attending: Nephrology | Admitting: Nephrology

## 2016-06-14 DIAGNOSIS — N183 Chronic kidney disease, stage 3 unspecified: Secondary | ICD-10-CM

## 2016-06-14 DIAGNOSIS — D631 Anemia in chronic kidney disease: Secondary | ICD-10-CM | POA: Diagnosis not present

## 2016-06-14 DIAGNOSIS — E1122 Type 2 diabetes mellitus with diabetic chronic kidney disease: Secondary | ICD-10-CM

## 2016-06-14 LAB — POCT HEMOGLOBIN-HEMACUE: Hemoglobin: 11.5 g/dL — ABNORMAL LOW (ref 13.0–17.0)

## 2016-06-14 MED ORDER — DARBEPOETIN ALFA 60 MCG/0.3ML IJ SOSY
60.0000 ug | PREFILLED_SYRINGE | INTRAMUSCULAR | Status: DC
  Administered 2016-06-14: 60 ug via SUBCUTANEOUS

## 2016-06-14 MED ORDER — DARBEPOETIN ALFA 60 MCG/0.3ML IJ SOSY
PREFILLED_SYRINGE | INTRAMUSCULAR | Status: AC
  Filled 2016-06-14: qty 0.3

## 2016-06-21 ENCOUNTER — Encounter (HOSPITAL_COMMUNITY)
Admission: RE | Admit: 2016-06-21 | Discharge: 2016-06-21 | Disposition: A | Discharge status: Home / Self Care | Payer: Medicare Other | Source: Ambulatory Visit | Attending: Nephrology | Admitting: Nephrology

## 2016-06-21 DIAGNOSIS — E1122 Type 2 diabetes mellitus with diabetic chronic kidney disease: Secondary | ICD-10-CM

## 2016-06-21 DIAGNOSIS — D631 Anemia in chronic kidney disease: Secondary | ICD-10-CM | POA: Diagnosis not present

## 2016-06-21 DIAGNOSIS — N183 Chronic kidney disease, stage 3 unspecified: Secondary | ICD-10-CM

## 2016-06-21 LAB — POCT HEMOGLOBIN-HEMACUE: HEMOGLOBIN: 11.9 g/dL — AB (ref 13.0–17.0)

## 2016-06-21 MED ORDER — DARBEPOETIN ALFA 60 MCG/0.3ML IJ SOSY
60.0000 ug | PREFILLED_SYRINGE | INTRAMUSCULAR | Status: DC
  Administered 2016-06-21: 60 ug via SUBCUTANEOUS

## 2016-06-21 MED ORDER — DARBEPOETIN ALFA 60 MCG/0.3ML IJ SOSY
PREFILLED_SYRINGE | INTRAMUSCULAR | Status: AC
  Filled 2016-06-21: qty 0.3

## 2016-06-28 ENCOUNTER — Encounter (HOSPITAL_COMMUNITY)
Admission: RE | Admit: 2016-06-28 | Discharge: 2016-06-28 | Disposition: A | Discharge status: Home / Self Care | Payer: Medicare Other | Source: Ambulatory Visit | Attending: Nephrology | Admitting: Nephrology

## 2016-06-28 DIAGNOSIS — E1122 Type 2 diabetes mellitus with diabetic chronic kidney disease: Secondary | ICD-10-CM

## 2016-06-28 DIAGNOSIS — D631 Anemia in chronic kidney disease: Secondary | ICD-10-CM | POA: Diagnosis not present

## 2016-06-28 DIAGNOSIS — N183 Chronic kidney disease, stage 3 unspecified: Secondary | ICD-10-CM

## 2016-06-28 LAB — POCT HEMOGLOBIN-HEMACUE: Hemoglobin: 13.2 g/dL (ref 13.0–17.0)

## 2016-06-28 MED ORDER — DARBEPOETIN ALFA 60 MCG/0.3ML IJ SOSY
60.0000 ug | PREFILLED_SYRINGE | INTRAMUSCULAR | Status: DC
Start: 2016-06-28 — End: 2016-06-29

## 2016-07-03 IMAGING — CT CT HEAD W/O CM
2 series · 16 of 30 positions shown, 20 images · non-contrast
Comparison: None.

CLINICAL DATA: Syncopal episode. Lightheadedness. Recent
colonoscopy.

EXAM:
CT HEAD WITHOUT CONTRAST
TECHNIQUE: Contiguous axial images were obtained from the base of the skull
through the vertex without intravenous contrast.

[Series 201: head w/o, idose (1) · axial · non-contrast · 0.49mm/px · z∈[+79,+209]mm · 13 of 32 slices shown, 17 images]
[im 3/32  brain]
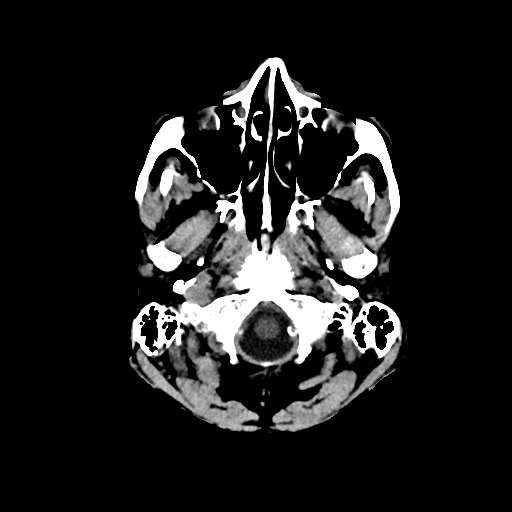
[im 3/32  bone]
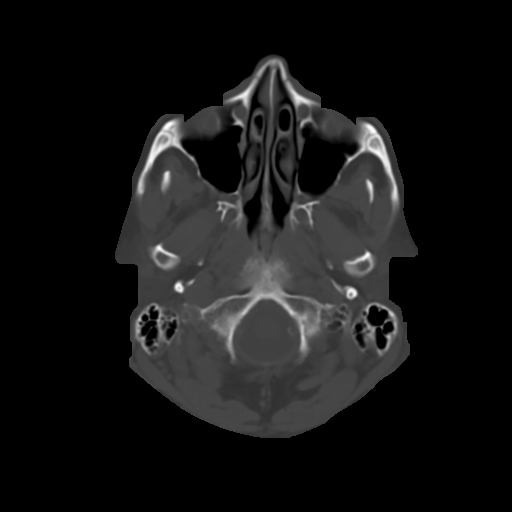
[im 5/32  brain]
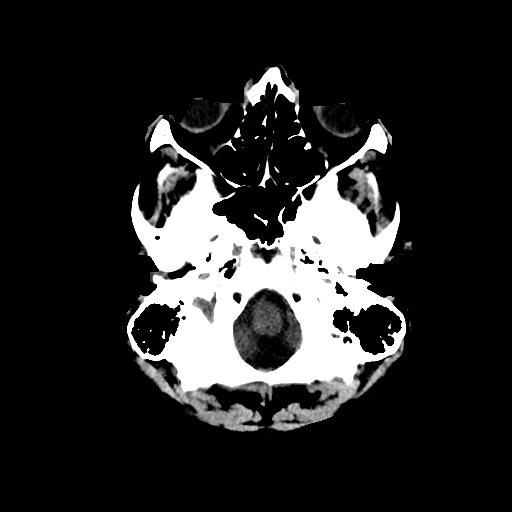
[im 7/32  brain]
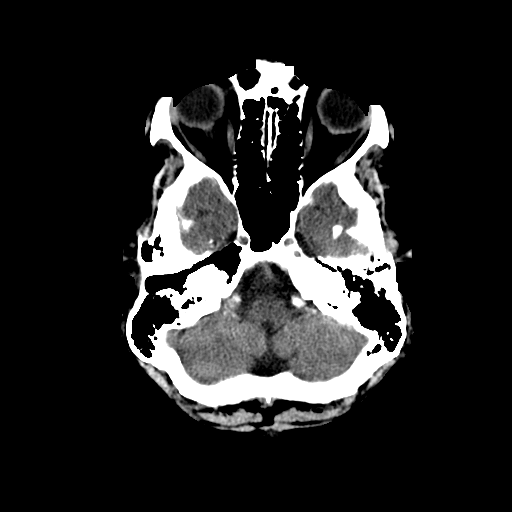
[im 9/32  brain]
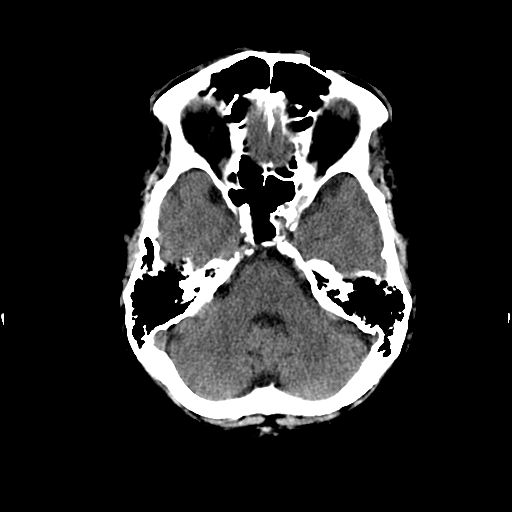
[im 12/32  brain]
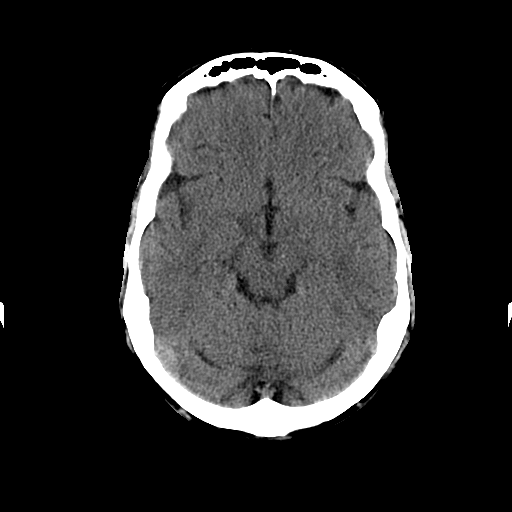
[im 12/32  bone]
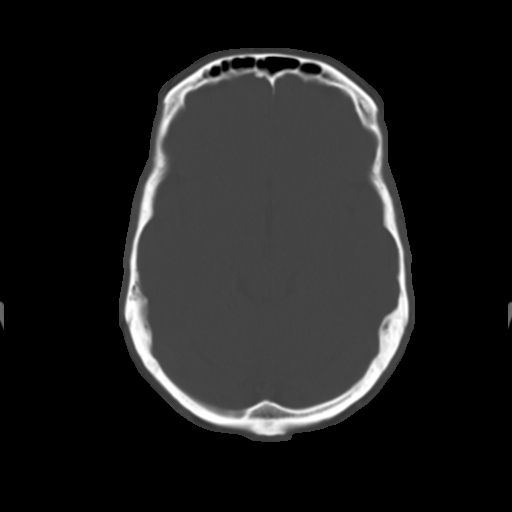
[im 14/32  brain]
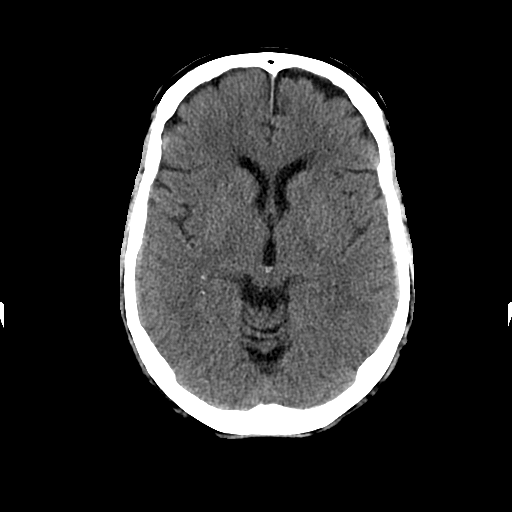
[im 16/32  brain]
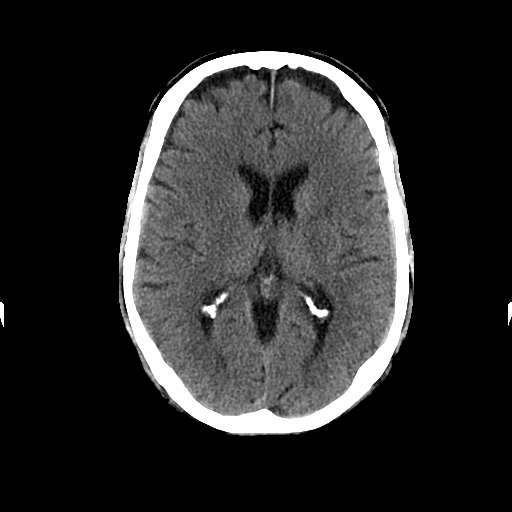
[im 18/32  brain]
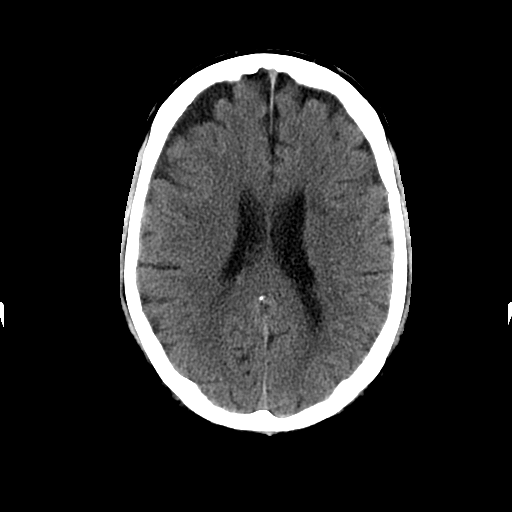
[im 20/32  brain]
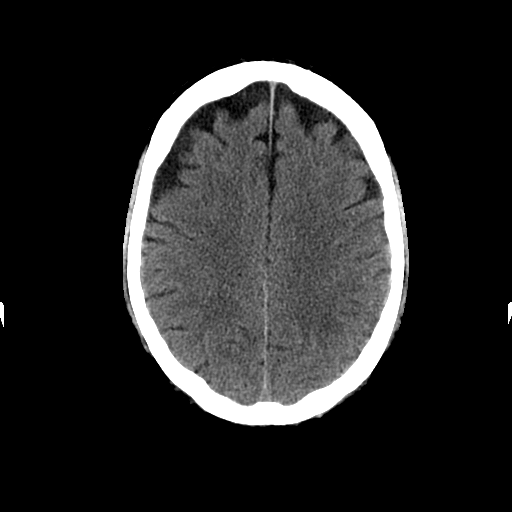
[im 20/32  bone]
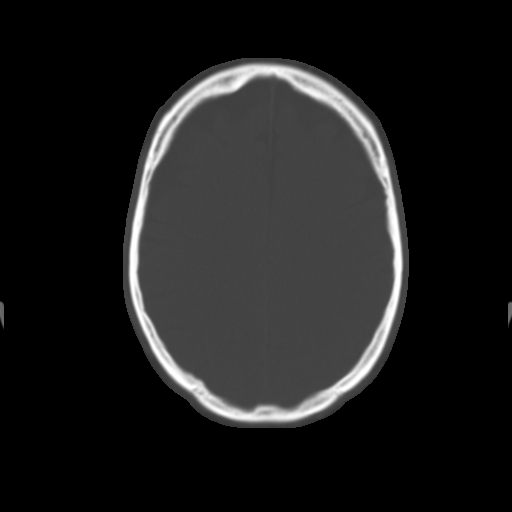
[im 23/32  brain]
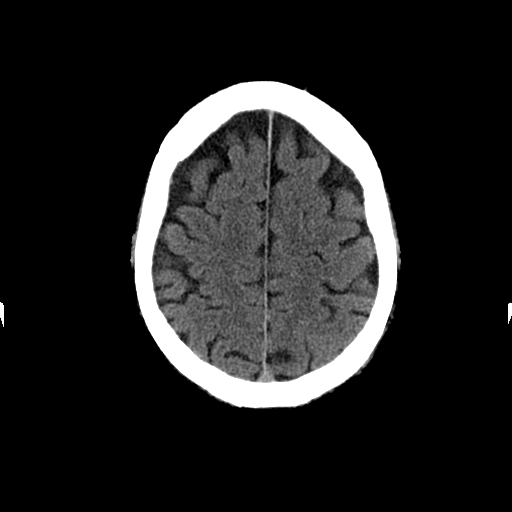
[im 25/32  brain]
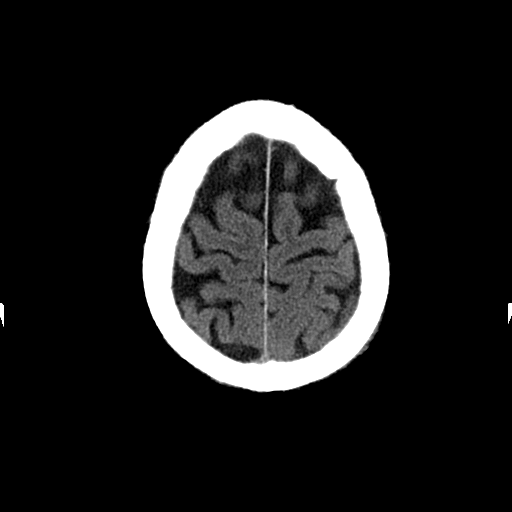
[im 27/32  brain]
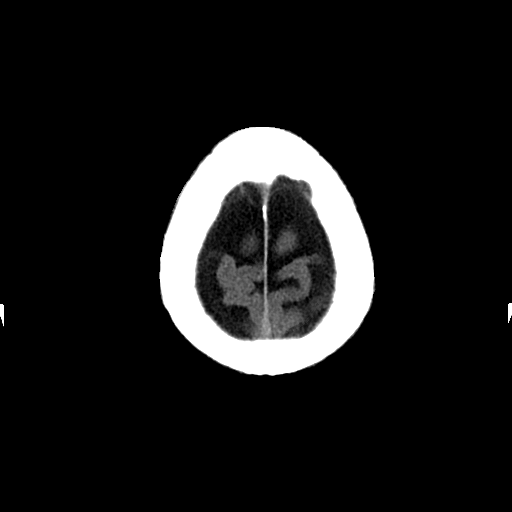
[im 29/32  brain]
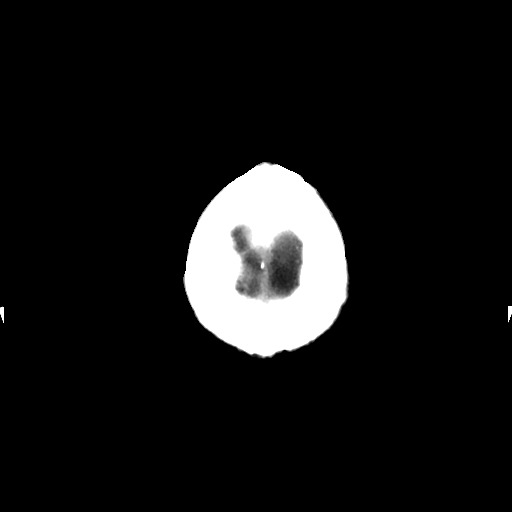
[im 29/32  bone]
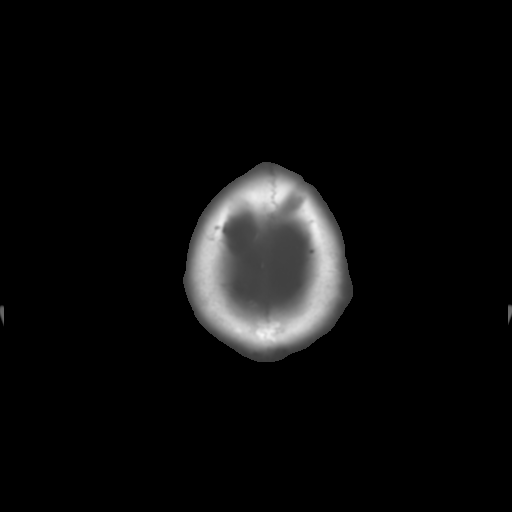

[Series 202: head w/o bone, idose (1) · axial · non-contrast · 0.49mm/px · z∈[+79,+124]mm · 3 of 32 slices shown]
[im 3/32  bone]
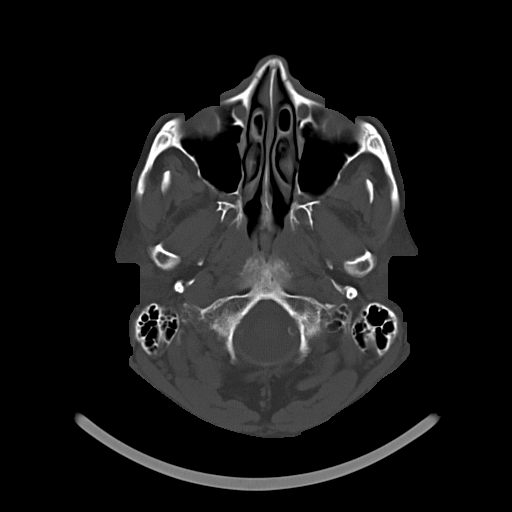
[im 7/32  bone]
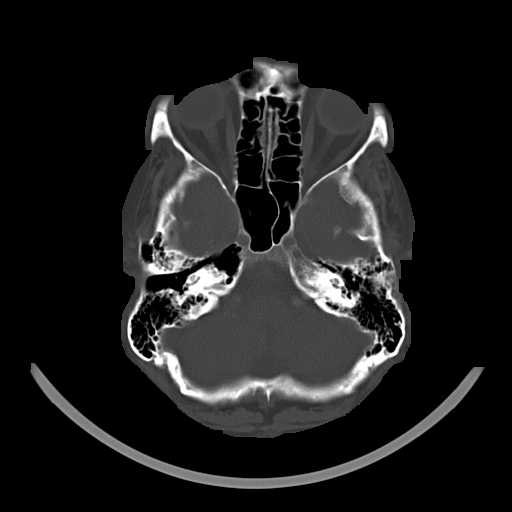
[im 12/32  bone]
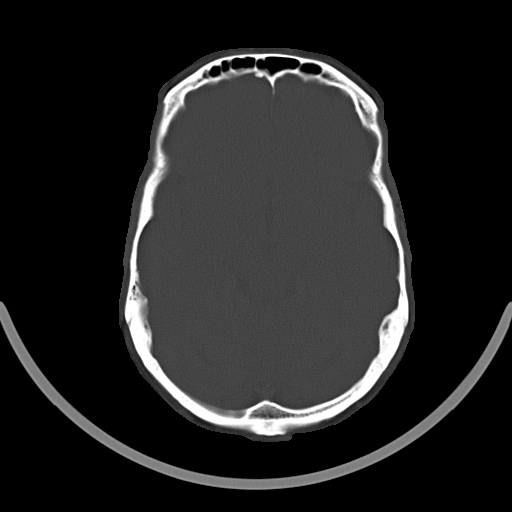

[16 of 30 positions shown; findings below may reference images not displayed]

FINDINGS: No acute intracranial hemorrhage. No focal mass lesion. No CT
evidence of acute infarction. No midline shift or mass effect. No
hydrocephalus. Basilar cisterns are patent.

There are several focal periventricular white matter hypodensities
on the left.

Paranasal sinuses and  mastoid air cells are clear.
IMPRESSION: 1. No clear acute intracranial findings.
2. Focal hypodensities within the left deep white matter likely
represent chronic microvascular disease however are age
indeterminate.

## 2016-07-04 DIAGNOSIS — H2512 Age-related nuclear cataract, left eye: Secondary | ICD-10-CM | POA: Diagnosis not present

## 2016-07-04 DIAGNOSIS — H25812 Combined forms of age-related cataract, left eye: Secondary | ICD-10-CM | POA: Diagnosis not present

## 2016-07-04 IMAGING — CR DG CHEST 1V PORT
1 series · 1 of 1 positions shown · non-contrast
Comparison: 10/07/2014

CLINICAL DATA: 53-year-old following pacer placement

EXAM:
PORTABLE CHEST - 1 VIEW

[AP]
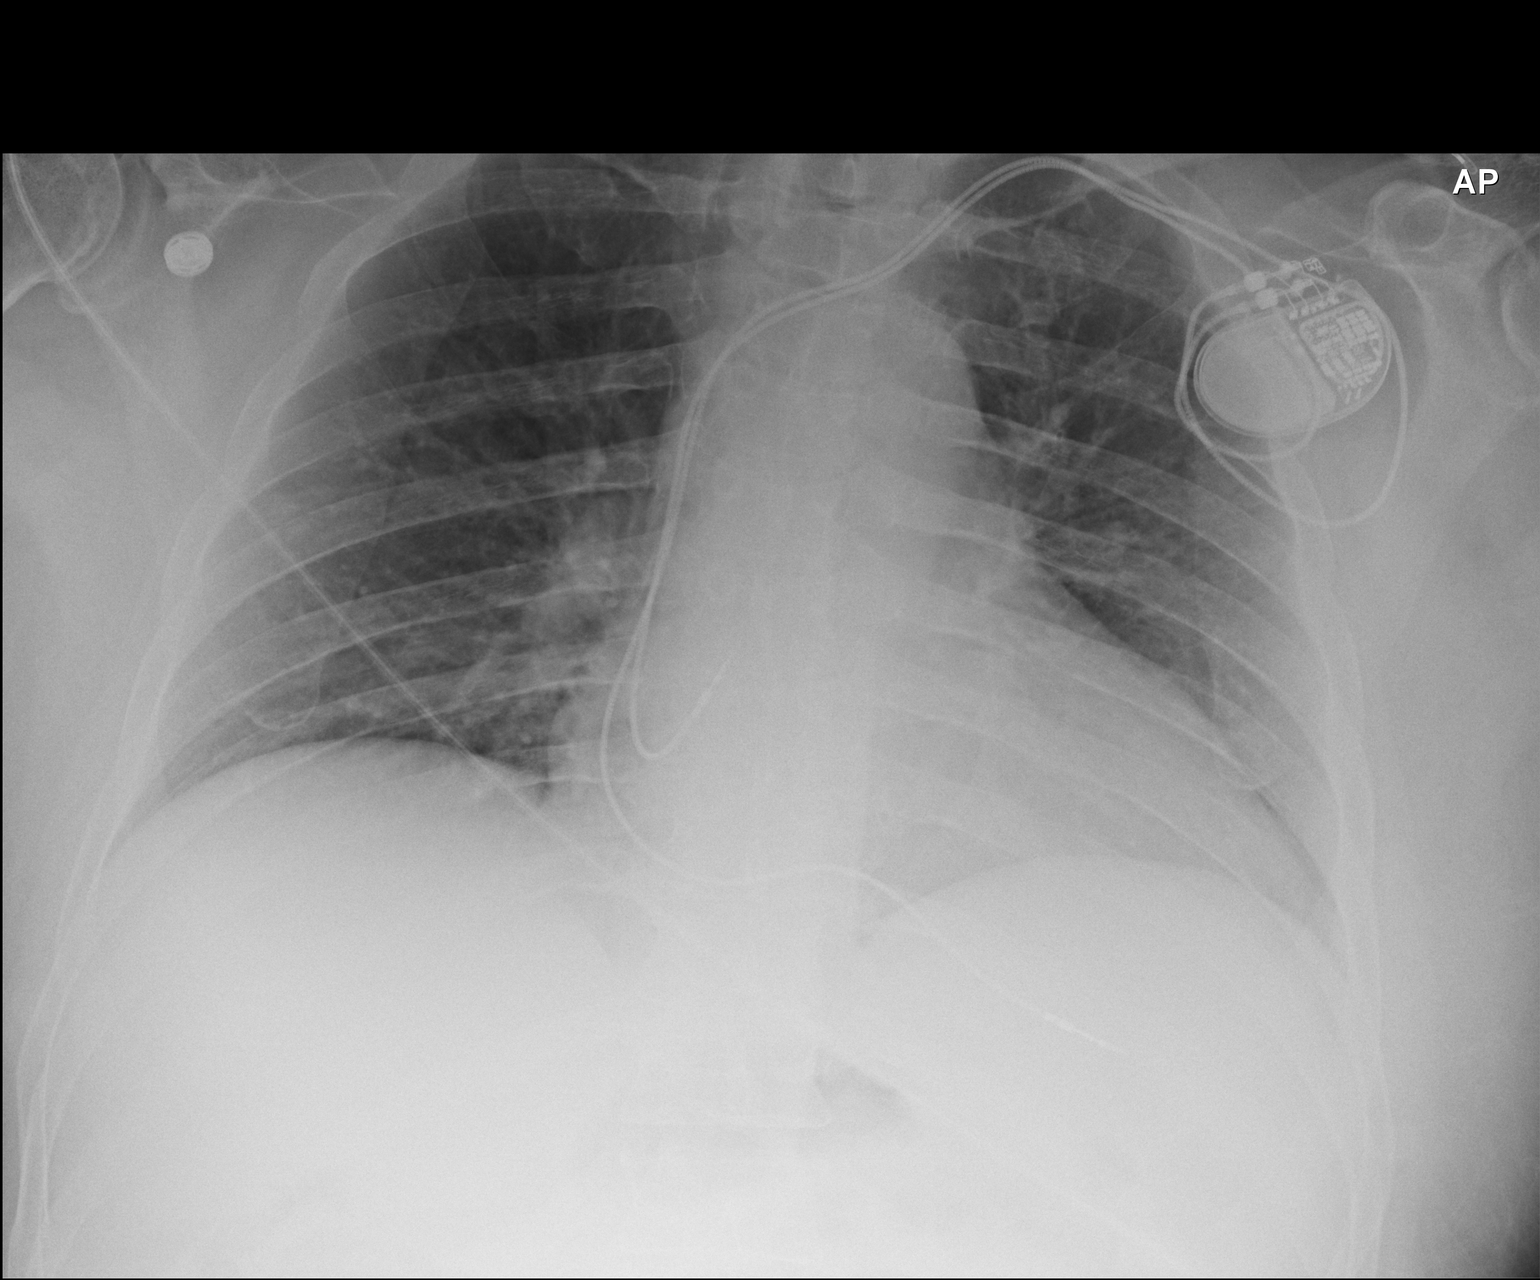

[1 of 1 positions shown; findings below may reference images not displayed]

FINDINGS: The cardiac silhouette is enlarged. The mediastinal contours are
within normal limits.

A left approach pacing device is present with tip projecting over
the right atrium and right ventricle.

There is no focal airspace consolidation, pleural effusion or
pneumothorax. There is no overt pulmonary edema.

The osseous structures are unremarkable.
IMPRESSION: 1. Interval dual lead pacer placement.
2. No pneumothorax.
3. Cardiomegaly.

## 2016-07-12 ENCOUNTER — Encounter (HOSPITAL_COMMUNITY)
Admission: RE | Admit: 2016-07-12 | Discharge: 2016-07-12 | Disposition: A | Discharge status: Home / Self Care | Payer: Medicare Other | Source: Ambulatory Visit | Attending: Nephrology | Admitting: Nephrology

## 2016-07-12 DIAGNOSIS — N183 Chronic kidney disease, stage 3 unspecified: Secondary | ICD-10-CM

## 2016-07-12 DIAGNOSIS — E1122 Type 2 diabetes mellitus with diabetic chronic kidney disease: Secondary | ICD-10-CM

## 2016-07-12 DIAGNOSIS — D631 Anemia in chronic kidney disease: Secondary | ICD-10-CM | POA: Diagnosis not present

## 2016-07-12 LAB — POCT HEMOGLOBIN-HEMACUE: Hemoglobin: 12.2 g/dL — ABNORMAL LOW (ref 13.0–17.0)

## 2016-07-12 LAB — FERRITIN: Ferritin: 179 ng/mL (ref 24–336)

## 2016-07-12 LAB — IRON AND TIBC
Iron: 109 ug/dL (ref 45–182)
SATURATION RATIOS: 40 % — AB (ref 17.9–39.5)
TIBC: 272 ug/dL (ref 250–450)
UIBC: 163 ug/dL

## 2016-07-12 MED ORDER — DARBEPOETIN ALFA 60 MCG/0.3ML IJ SOSY
60.0000 ug | PREFILLED_SYRINGE | INTRAMUSCULAR | Status: DC
Start: 2016-07-12 — End: 2016-07-13

## 2016-07-26 ENCOUNTER — Encounter (HOSPITAL_COMMUNITY)
Admission: RE | Admit: 2016-07-26 | Discharge: 2016-07-26 | Disposition: A | Discharge status: Home / Self Care | Payer: Medicare Other | Source: Ambulatory Visit | Attending: Nephrology | Admitting: Nephrology

## 2016-07-26 DIAGNOSIS — N183 Chronic kidney disease, stage 3 unspecified: Secondary | ICD-10-CM

## 2016-07-26 DIAGNOSIS — D631 Anemia in chronic kidney disease: Secondary | ICD-10-CM | POA: Insufficient documentation

## 2016-07-26 DIAGNOSIS — E1165 Type 2 diabetes mellitus with hyperglycemia: Secondary | ICD-10-CM | POA: Diagnosis not present

## 2016-07-26 DIAGNOSIS — E1122 Type 2 diabetes mellitus with diabetic chronic kidney disease: Secondary | ICD-10-CM

## 2016-07-26 LAB — POCT HEMOGLOBIN-HEMACUE: HEMOGLOBIN: 11.3 g/dL — AB (ref 13.0–17.0)

## 2016-07-26 MED ORDER — DARBEPOETIN ALFA 60 MCG/0.3ML IJ SOSY
PREFILLED_SYRINGE | INTRAMUSCULAR | Status: AC
  Filled 2016-07-26: qty 0.3

## 2016-07-26 MED ORDER — DARBEPOETIN ALFA 60 MCG/0.3ML IJ SOSY
60.0000 ug | PREFILLED_SYRINGE | INTRAMUSCULAR | Status: DC
  Administered 2016-07-26: 60 ug via SUBCUTANEOUS

## 2016-08-02 ENCOUNTER — Encounter (HOSPITAL_COMMUNITY)
Admission: RE | Admit: 2016-08-02 | Discharge: 2016-08-02 | Disposition: A | Discharge status: Home / Self Care | Payer: Medicare Other | Source: Ambulatory Visit | Attending: Nephrology | Admitting: Nephrology

## 2016-08-02 DIAGNOSIS — N183 Chronic kidney disease, stage 3 unspecified: Secondary | ICD-10-CM

## 2016-08-02 DIAGNOSIS — D631 Anemia in chronic kidney disease: Secondary | ICD-10-CM | POA: Diagnosis not present

## 2016-08-02 DIAGNOSIS — E1122 Type 2 diabetes mellitus with diabetic chronic kidney disease: Secondary | ICD-10-CM

## 2016-08-02 LAB — POCT HEMOGLOBIN-HEMACUE: HEMOGLOBIN: 11.8 g/dL — AB (ref 13.0–17.0)

## 2016-08-02 MED ORDER — DARBEPOETIN ALFA 60 MCG/0.3ML IJ SOSY
60.0000 ug | PREFILLED_SYRINGE | INTRAMUSCULAR | Status: DC
  Administered 2016-08-02: 60 ug via SUBCUTANEOUS

## 2016-08-02 MED ORDER — DARBEPOETIN ALFA 60 MCG/0.3ML IJ SOSY
PREFILLED_SYRINGE | INTRAMUSCULAR | Status: AC
  Filled 2016-08-02: qty 0.3

## 2016-08-08 DIAGNOSIS — H52221 Regular astigmatism, right eye: Secondary | ICD-10-CM | POA: Diagnosis not present

## 2016-08-08 DIAGNOSIS — H2511 Age-related nuclear cataract, right eye: Secondary | ICD-10-CM | POA: Diagnosis not present

## 2016-08-08 DIAGNOSIS — H25811 Combined forms of age-related cataract, right eye: Secondary | ICD-10-CM | POA: Diagnosis not present

## 2016-08-09 ENCOUNTER — Encounter (HOSPITAL_COMMUNITY)
Admission: RE | Admit: 2016-08-09 | Discharge: 2016-08-09 | Disposition: A | Discharge status: Home / Self Care | Payer: Medicare Other | Source: Ambulatory Visit | Attending: Nephrology | Admitting: Nephrology

## 2016-08-09 DIAGNOSIS — E1122 Type 2 diabetes mellitus with diabetic chronic kidney disease: Secondary | ICD-10-CM

## 2016-08-09 DIAGNOSIS — N183 Chronic kidney disease, stage 3 unspecified: Secondary | ICD-10-CM

## 2016-08-09 DIAGNOSIS — D631 Anemia in chronic kidney disease: Secondary | ICD-10-CM | POA: Diagnosis not present

## 2016-08-09 LAB — IRON AND TIBC
Iron: 68 ug/dL (ref 45–182)
Saturation Ratios: 24 % (ref 17.9–39.5)
TIBC: 287 ug/dL (ref 250–450)
UIBC: 219 ug/dL

## 2016-08-09 LAB — POCT HEMOGLOBIN-HEMACUE: HEMOGLOBIN: 12.6 g/dL — AB (ref 13.0–17.0)

## 2016-08-09 LAB — FERRITIN: Ferritin: 122 ng/mL (ref 24–336)

## 2016-08-09 MED ORDER — DARBEPOETIN ALFA 60 MCG/0.3ML IJ SOSY
60.0000 ug | PREFILLED_SYRINGE | INTRAMUSCULAR | Status: DC
Start: 2016-08-09 — End: 2016-08-10

## 2016-08-10 DIAGNOSIS — H2511 Age-related nuclear cataract, right eye: Secondary | ICD-10-CM | POA: Diagnosis not present

## 2016-08-10 DIAGNOSIS — I10 Essential (primary) hypertension: Secondary | ICD-10-CM | POA: Diagnosis not present

## 2016-08-10 DIAGNOSIS — I480 Paroxysmal atrial fibrillation: Secondary | ICD-10-CM | POA: Diagnosis not present

## 2016-08-10 DIAGNOSIS — I739 Peripheral vascular disease, unspecified: Secondary | ICD-10-CM | POA: Diagnosis not present

## 2016-08-10 DIAGNOSIS — I25119 Atherosclerotic heart disease of native coronary artery with unspecified angina pectoris: Secondary | ICD-10-CM | POA: Diagnosis not present

## 2016-08-16 DIAGNOSIS — H35372 Puckering of macula, left eye: Secondary | ICD-10-CM | POA: Diagnosis not present

## 2016-08-21 DIAGNOSIS — E113591 Type 2 diabetes mellitus with proliferative diabetic retinopathy without macular edema, right eye: Secondary | ICD-10-CM | POA: Diagnosis not present

## 2016-08-21 DIAGNOSIS — H4312 Vitreous hemorrhage, left eye: Secondary | ICD-10-CM | POA: Diagnosis not present

## 2016-08-21 DIAGNOSIS — E113592 Type 2 diabetes mellitus with proliferative diabetic retinopathy without macular edema, left eye: Secondary | ICD-10-CM | POA: Diagnosis not present

## 2016-08-21 DIAGNOSIS — H3561 Retinal hemorrhage, right eye: Secondary | ICD-10-CM | POA: Diagnosis not present

## 2016-08-23 ENCOUNTER — Encounter (HOSPITAL_COMMUNITY)
Admission: RE | Admit: 2016-08-23 | Discharge: 2016-08-23 | Disposition: A | Discharge status: Home / Self Care | Payer: Medicare Other | Source: Ambulatory Visit | Attending: Nephrology | Admitting: Nephrology

## 2016-08-23 DIAGNOSIS — D631 Anemia in chronic kidney disease: Secondary | ICD-10-CM | POA: Diagnosis not present

## 2016-08-23 DIAGNOSIS — N183 Chronic kidney disease, stage 3 unspecified: Secondary | ICD-10-CM

## 2016-08-23 DIAGNOSIS — H4312 Vitreous hemorrhage, left eye: Secondary | ICD-10-CM | POA: Diagnosis not present

## 2016-08-23 DIAGNOSIS — E1122 Type 2 diabetes mellitus with diabetic chronic kidney disease: Secondary | ICD-10-CM

## 2016-08-23 DIAGNOSIS — E113592 Type 2 diabetes mellitus with proliferative diabetic retinopathy without macular edema, left eye: Secondary | ICD-10-CM | POA: Diagnosis not present

## 2016-08-23 LAB — POCT HEMOGLOBIN-HEMACUE: Hemoglobin: 11.8 g/dL — ABNORMAL LOW (ref 13.0–17.0)

## 2016-08-23 MED ORDER — DARBEPOETIN ALFA 60 MCG/0.3ML IJ SOSY
60.0000 ug | PREFILLED_SYRINGE | INTRAMUSCULAR | Status: DC
  Administered 2016-08-23: 60 ug via SUBCUTANEOUS

## 2016-08-23 MED ORDER — DARBEPOETIN ALFA 60 MCG/0.3ML IJ SOSY
PREFILLED_SYRINGE | INTRAMUSCULAR | Status: AC
  Filled 2016-08-23: qty 0.3

## 2016-08-27 DIAGNOSIS — I10 Essential (primary) hypertension: Secondary | ICD-10-CM | POA: Diagnosis not present

## 2016-08-30 ENCOUNTER — Encounter (HOSPITAL_COMMUNITY)
Admission: RE | Admit: 2016-08-30 | Discharge: 2016-08-30 | Disposition: A | Discharge status: Home / Self Care | Payer: Medicare Other | Source: Ambulatory Visit | Attending: Nephrology | Admitting: Nephrology

## 2016-08-30 DIAGNOSIS — N183 Chronic kidney disease, stage 3 unspecified: Secondary | ICD-10-CM

## 2016-08-30 DIAGNOSIS — E1122 Type 2 diabetes mellitus with diabetic chronic kidney disease: Secondary | ICD-10-CM

## 2016-08-30 DIAGNOSIS — D631 Anemia in chronic kidney disease: Secondary | ICD-10-CM | POA: Diagnosis not present

## 2016-08-30 LAB — POCT HEMOGLOBIN-HEMACUE: Hemoglobin: 11.3 g/dL — ABNORMAL LOW (ref 13.0–17.0)

## 2016-08-30 MED ORDER — DARBEPOETIN ALFA 60 MCG/0.3ML IJ SOSY
PREFILLED_SYRINGE | INTRAMUSCULAR | Status: AC
  Filled 2016-08-30: qty 0.3

## 2016-08-30 MED ORDER — DARBEPOETIN ALFA 60 MCG/0.3ML IJ SOSY
60.0000 ug | PREFILLED_SYRINGE | INTRAMUSCULAR | Status: DC
  Administered 2016-08-30: 60 ug via SUBCUTANEOUS

## 2016-09-03 DIAGNOSIS — E113591 Type 2 diabetes mellitus with proliferative diabetic retinopathy without macular edema, right eye: Secondary | ICD-10-CM | POA: Diagnosis not present

## 2016-09-05 ENCOUNTER — Other Ambulatory Visit (HOSPITAL_COMMUNITY): Payer: Self-pay | Admitting: *Deleted

## 2016-09-06 ENCOUNTER — Encounter (HOSPITAL_COMMUNITY)
Admission: RE | Admit: 2016-09-06 | Discharge: 2016-09-06 | Disposition: A | Discharge status: Home / Self Care | Payer: Medicare Other | Source: Ambulatory Visit | Attending: Nephrology | Admitting: Nephrology

## 2016-09-06 DIAGNOSIS — N183 Chronic kidney disease, stage 3 unspecified: Secondary | ICD-10-CM

## 2016-09-06 DIAGNOSIS — E1122 Type 2 diabetes mellitus with diabetic chronic kidney disease: Secondary | ICD-10-CM

## 2016-09-06 DIAGNOSIS — D631 Anemia in chronic kidney disease: Secondary | ICD-10-CM | POA: Diagnosis not present

## 2016-09-06 LAB — FERRITIN: FERRITIN: 109 ng/mL (ref 24–336)

## 2016-09-06 LAB — POCT HEMOGLOBIN-HEMACUE: Hemoglobin: 12 g/dL — ABNORMAL LOW (ref 13.0–17.0)

## 2016-09-06 LAB — IRON AND TIBC
IRON: 53 ug/dL (ref 45–182)
Saturation Ratios: 20 % (ref 17.9–39.5)
TIBC: 266 ug/dL (ref 250–450)
UIBC: 213 ug/dL

## 2016-09-06 MED ORDER — DARBEPOETIN ALFA 60 MCG/0.3ML IJ SOSY
60.0000 ug | PREFILLED_SYRINGE | INTRAMUSCULAR | Status: DC
Start: 2016-09-06 — End: 2016-09-07

## 2016-09-20 ENCOUNTER — Encounter (HOSPITAL_COMMUNITY)
Admission: RE | Admit: 2016-09-20 | Discharge: 2016-09-20 | Disposition: A | Discharge status: Home / Self Care | Payer: Medicare Other | Source: Ambulatory Visit | Attending: Nephrology | Admitting: Nephrology

## 2016-09-20 DIAGNOSIS — D631 Anemia in chronic kidney disease: Secondary | ICD-10-CM | POA: Diagnosis not present

## 2016-09-20 DIAGNOSIS — N183 Chronic kidney disease, stage 3 unspecified: Secondary | ICD-10-CM

## 2016-09-20 DIAGNOSIS — E1122 Type 2 diabetes mellitus with diabetic chronic kidney disease: Secondary | ICD-10-CM

## 2016-09-20 LAB — POCT HEMOGLOBIN-HEMACUE: Hemoglobin: 11.9 g/dL — ABNORMAL LOW (ref 13.0–17.0)

## 2016-09-20 MED ORDER — DARBEPOETIN ALFA 60 MCG/0.3ML IJ SOSY
PREFILLED_SYRINGE | INTRAMUSCULAR | Status: AC
  Filled 2016-09-20: qty 0.3

## 2016-09-20 MED ORDER — DARBEPOETIN ALFA 60 MCG/0.3ML IJ SOSY
60.0000 ug | PREFILLED_SYRINGE | INTRAMUSCULAR | Status: DC
  Administered 2016-09-20: 60 ug via SUBCUTANEOUS

## 2016-09-25 DIAGNOSIS — H4312 Vitreous hemorrhage, left eye: Secondary | ICD-10-CM | POA: Diagnosis not present

## 2016-09-25 DIAGNOSIS — E113591 Type 2 diabetes mellitus with proliferative diabetic retinopathy without macular edema, right eye: Secondary | ICD-10-CM | POA: Diagnosis not present

## 2016-09-25 DIAGNOSIS — E113592 Type 2 diabetes mellitus with proliferative diabetic retinopathy without macular edema, left eye: Secondary | ICD-10-CM | POA: Diagnosis not present

## 2016-09-25 DIAGNOSIS — E113522 Type 2 diabetes mellitus with proliferative diabetic retinopathy with traction retinal detachment involving the macula, left eye: Secondary | ICD-10-CM | POA: Diagnosis not present

## 2016-09-26 ENCOUNTER — Encounter: Payer: Self-pay | Admitting: Surgery

## 2016-09-27 ENCOUNTER — Encounter (HOSPITAL_COMMUNITY)
Admission: RE | Admit: 2016-09-27 | Discharge: 2016-09-27 | Disposition: A | Discharge status: Home / Self Care | Payer: Medicare Other | Source: Ambulatory Visit | Attending: Nephrology | Admitting: Nephrology

## 2016-09-27 DIAGNOSIS — Z7901 Long term (current) use of anticoagulants: Secondary | ICD-10-CM | POA: Diagnosis not present

## 2016-09-27 DIAGNOSIS — N183 Chronic kidney disease, stage 3 unspecified: Secondary | ICD-10-CM

## 2016-09-27 DIAGNOSIS — D631 Anemia in chronic kidney disease: Secondary | ICD-10-CM | POA: Diagnosis not present

## 2016-09-27 DIAGNOSIS — E1122 Type 2 diabetes mellitus with diabetic chronic kidney disease: Secondary | ICD-10-CM

## 2016-09-27 LAB — POCT HEMOGLOBIN-HEMACUE: Hemoglobin: 11.8 g/dL — ABNORMAL LOW (ref 13.0–17.0)

## 2016-09-27 MED ORDER — DARBEPOETIN ALFA 60 MCG/0.3ML IJ SOSY
60.0000 ug | PREFILLED_SYRINGE | INTRAMUSCULAR | Status: DC
  Administered 2016-09-27: 60 ug via SUBCUTANEOUS

## 2016-09-27 MED ORDER — DARBEPOETIN ALFA 60 MCG/0.3ML IJ SOSY
PREFILLED_SYRINGE | INTRAMUSCULAR | Status: AC
  Filled 2016-09-27: qty 0.3

## 2016-10-01 DIAGNOSIS — E113522 Type 2 diabetes mellitus with proliferative diabetic retinopathy with traction retinal detachment involving the macula, left eye: Secondary | ICD-10-CM | POA: Diagnosis not present

## 2016-10-01 DIAGNOSIS — E113592 Type 2 diabetes mellitus with proliferative diabetic retinopathy without macular edema, left eye: Secondary | ICD-10-CM | POA: Diagnosis not present

## 2016-10-01 DIAGNOSIS — H4312 Vitreous hemorrhage, left eye: Secondary | ICD-10-CM | POA: Diagnosis not present

## 2016-10-05 ENCOUNTER — Inpatient Hospital Stay (HOSPITAL_COMMUNITY): Admission: RE | Admit: 2016-10-05 | Payer: Medicare Other | Source: Ambulatory Visit

## 2016-10-08 ENCOUNTER — Ambulatory Visit (INDEPENDENT_AMBULATORY_CARE_PROVIDER_SITE_OTHER)
Admission: RE | Admit: 2016-10-08 | Discharge: 2016-10-08 | Disposition: A | Discharge status: Home / Self Care | Payer: Medicare Other | Source: Ambulatory Visit | Attending: Surgery | Admitting: Surgery

## 2016-10-08 ENCOUNTER — Ambulatory Visit (INDEPENDENT_AMBULATORY_CARE_PROVIDER_SITE_OTHER): Payer: Medicare Other | Admitting: Surgery

## 2016-10-08 ENCOUNTER — Encounter: Payer: Self-pay | Admitting: *Deleted

## 2016-10-08 ENCOUNTER — Other Ambulatory Visit: Payer: Self-pay | Admitting: *Deleted

## 2016-10-08 ENCOUNTER — Ambulatory Visit (HOSPITAL_COMMUNITY)
Admission: RE | Admit: 2016-10-08 | Discharge: 2016-10-08 | Disposition: A | Discharge status: Home / Self Care | Payer: Medicare Other | Source: Ambulatory Visit | Attending: Surgery | Admitting: Surgery

## 2016-10-08 ENCOUNTER — Encounter: Payer: Self-pay | Admitting: Surgery

## 2016-10-08 VITALS — BP 111/66 | HR 66 | Temp 97.3°F | Resp 20 | Ht 72.0 in | Wt 255.3 lb

## 2016-10-08 DIAGNOSIS — I779 Disorder of arteries and arterioles, unspecified: Secondary | ICD-10-CM | POA: Diagnosis not present

## 2016-10-08 DIAGNOSIS — I70335 Atherosclerosis of unspecified type of bypass graft(s) of the right leg with ulceration of other part of foot: Secondary | ICD-10-CM

## 2016-10-08 LAB — VAS US LOWER EXTREMITY ARTERIAL DUPLEX
RATIBDISTSYS: 12 cm/s
RIGHT POST TIB DIST SYS: 23 cm/s
RSFMPSV: -90 cm/s
Right popliteal dist sys PSV: -38 cm/s
Right popliteal prox sys PSV: -331 cm/s
Right super femoral dist sys PSV: -45 cm/s
Right super femoral prox sys PSV: 153 cm/s

## 2016-10-08 NOTE — Progress Notes (Signed)
Vascular and Vein Specialist of [facility]  Patient name: Alexander [name] MRN: [id number] DOB: September 30, 1961 Sex: male  REASON FOR VISIT: follow up  HPI: Alexander [name] is a 55 y.o. male who is back for follow-up.  He is status post left femoral to tibioperoneal trunk bypass on 10/21/2013 by Dr. Scot Dock.  This was done for a nonhealing wound which ultimately required a transmetatarsal amputation performed by Dr. Berenice Primas.  The patient also had a wound on his right leg and on 11/08/2015 I performed atherectomy with drug coated balloon angioplasty for a tandem above-the-knee popliteal stenosis.   He has developed a ulcer between the fourth and fifth toe on the right as well as some discoloration on his toes and pain for about 1 month.  He is on Eliquis for AFIB he suffers from chronic kidney disease as well as diabetes.  He is a former smoker  Past Medical History:  Diagnosis Date  . Alcohol abuse   . Anemia   . Arthritis   . Atrial fibrillation ([place])   . Cardiac syncope 10/07/14   rapid A fib with 8 sec pauses on converison with syncope- temp pacing wire placed then PPM  . Cataract    BILATERAL  . Chronic kidney disease   . Coronary artery disease   . Diabetes mellitus    dx---been  awhile  . Hyperlipemia   . Hypertension   . Peripheral vascular disease ([place])   . Presence of permanent cardiac pacemaker 10/08/2014    Family History  Problem Relation Age of Onset  . Diabetes type II Mother   . Hypertension Mother   . Diabetes Mother   . Liver cancer Father   . Diabetes type II Sister   . Breast cancer Sister   . Diabetes Sister   . Hypertension Sister   . Diabetes type II Brother   . Kidney failure Brother   . Diabetes Brother   . Hypertension Brother   . Diabetes type II Sister     SOCIAL HISTORY: Social History  Substance Use Topics  . Smoking status: Former Smoker    Packs/day: 1.50    Years: 30.00    Types: Cigars    Quit date:  10/07/2014  . Smokeless tobacco: Never Used  . Alcohol use 21.0 oz/week    35 Cans of beer per week     Comment: 6 of 12 oz beers a day    Allergies  Allergen Reactions  . Sulfa Antibiotics Rash    Current Outpatient Prescriptions  Medication Sig Dispense Refill  . ACCU-CHEK FASTCLIX LANCETS MISC Check blood sugar TID & QHS 102 each 2  . amLODipine (NORVASC) 10 MG tablet Take 1 tablet (10 mg total) by mouth daily. 30 tablet 2  . apixaban (ELIQUIS) 5 MG TABS tablet Take 1 tablet (5 mg total) by mouth 2 (two) times daily. 180 tablet 3  . aspirin EC 81 MG tablet Take 81 mg by mouth daily.    Marland Kitchen atorvastatin (LIPITOR) 20 MG tablet Take 20 tablets by mouth daily.  2  . blood glucose meter kit and supplies KIT Check blood sugar TID & QHS 1 each 0  . Blood Glucose Monitoring Suppl (ACCU-CHEK ADVANTAGE DIABETES) kit Use as instructed 1 each 0  . chlorthalidone (HYGROTON) 25 MG tablet Take 25 mg by mouth 2 (two) times daily.     . GlucoCom Lancets MISC Check blood sugar TID & QHS 100 each 0  . glucose blood (ACCU-CHEK AVIVA PLUS)  test strip Use as instructed 100 each 12  . glucose blood (CHOICE DM FORA G20 TEST STRIPS) test strip Check blood sugar TID & QHS 100 each 12  . glucose blood test strip Use as instructed 100 each 12  . hydrALAZINE (APRESOLINE) 100 MG tablet Take 100 mg by mouth 3 (three) times daily.    . Insulin Lispro Prot & Lispro (HUMALOG MIX 75/25 KWIKPEN) (75-25) 100 UNIT/ML Kwikpen Inject 30-40 Units into the skin 2 (two) times daily with a meal. Inject 40 units subcutaneously before breakfast and 30 units before supper    . lisinopril (PRINIVIL,ZESTRIL) 10 MG tablet   0  . metoprolol (LOPRESSOR) 100 MG tablet Take 100 mg by mouth 2 (two) times daily.    . Multiple Vitamin (MULTIVITAMIN WITH MINERALS) TABS tablet Take 1 tablet by mouth daily.    Marland Kitchen amiodarone (PACERONE) 400 MG tablet Take 0.5 tablets (200 mg total) by mouth daily. (Patient not taking: Reported on 10/08/2016) 60  tablet 0  . cloNIDine (CATAPRES - DOSED IN MG/24 HR) 0.2 mg/24hr patch Place 1 patch onto the skin once a week.  0  . clopidogrel (PLAVIX) 75 MG tablet Take 1 tablet (75 mg total) by mouth daily with breakfast. (Patient not taking: Reported on 10/08/2016) 60 tablet 6  . lisinopril (PRINIVIL,ZESTRIL) 20 MG tablet Take 20 mg by mouth daily.     No current facility-administered medications for this visit.     REVIEW OF SYSTEMS:  _0  denotes positive finding, _1  denotes negative finding Cardiac  Comments:  Chest pain or chest pressure:    Shortness of breath upon exertion:    Short of breath when lying flat:    Irregular heart rhythm: x       Vascular    Pain in calf, thigh, or hip brought on by ambulation: x   Pain in feet at night that wakes you up from your sleep:     Blood clot in your veins:    Leg swelling:         Pulmonary    Oxygen at home:    Productive cough:     Wheezing:         Neurologic    Sudden weakness in arms or legs:     Sudden numbness in arms or legs:     Sudden onset of difficulty speaking or slurred speech:    Temporary loss of vision in one eye:     Problems with dizziness:         Gastrointestinal    Blood in stool:     Vomited blood:         Genitourinary    Burning when urinating:     Blood in urine:        Psychiatric    Major depression:         Hematologic    Bleeding problems:    Problems with blood clotting too easily:        Skin    Rashes or ulcers: x       Constitutional    Fever or chills:      PHYSICAL EXAM: Vitals:   10/08/16 1137  BP: 111/66  Pulse: 66  Resp: 20  Temp: 97.3 F (36.3 C)  TempSrc: Oral  SpO2: 96%  Weight: 255 lb 4.8 oz (115.8 kg)  Height: 6' (1.829 m)    GENERAL: The patient is a well-nourished male, in no acute distress. The vital signs are documented above. CARDIAC:  There is a regular rate and rhythm.  VASCULAR: Nonpalpable right pedal pulse PULMONARY: There is good air exchange bilaterally  without wheezing or rales. ABDOMEN: Soft and non-tender with normal pitched bowel sounds.  MUSCULOSKELETAL: There are no major deformities or cyanosis. NEUROLOGIC: No focal weakness or paresthesias are detected. SKIN: Reddish discoloration of the right forefoot with ulcer between the fourth and fifth toe PSYCHIATRIC: The patient has a normal affect.  DATA:  Duplex shows an ABI of 1.0 on the left.  On the right there is a velocity elevation of 331 in the popliteal artery.  The ABI has significantly dropped from 0.91 to 0.34  MEDICAL ISSUES: The patient has a limb threatening situation with a right foot ulcer and a high-grade stenosis within the right popliteal artery which has previously undergone atherectomy and angioplasty.  This needs to be addressed urgently.  He is on Eliquis, which I am stopping today.  I'm putting him on for arteriogram on Wednesday.  If I can get a second room in the PV lab, I will do the procedure, otherwise it will fall on to Dr. Claretha Cooper  scheduled    Annamarie Major, MD Vascular and Vein Specialists of [facility] [phone] Pager [phone]

## 2016-10-09 DIAGNOSIS — Z7901 Long term (current) use of anticoagulants: Secondary | ICD-10-CM | POA: Diagnosis not present

## 2016-10-09 DIAGNOSIS — Z794 Long term (current) use of insulin: Secondary | ICD-10-CM | POA: Diagnosis not present

## 2016-10-09 DIAGNOSIS — I10 Essential (primary) hypertension: Secondary | ICD-10-CM | POA: Diagnosis not present

## 2016-10-09 DIAGNOSIS — I739 Peripheral vascular disease, unspecified: Secondary | ICD-10-CM | POA: Diagnosis not present

## 2016-10-09 DIAGNOSIS — I130 Hypertensive heart and chronic kidney disease with heart failure and stage 1 through stage 4 chronic kidney disease, or unspecified chronic kidney disease: Secondary | ICD-10-CM | POA: Diagnosis not present

## 2016-10-09 DIAGNOSIS — I482 Chronic atrial fibrillation: Secondary | ICD-10-CM | POA: Diagnosis not present

## 2016-10-09 DIAGNOSIS — E1159 Type 2 diabetes mellitus with other circulatory complications: Secondary | ICD-10-CM | POA: Diagnosis not present

## 2016-10-09 DIAGNOSIS — I25810 Atherosclerosis of coronary artery bypass graft(s) without angina pectoris: Secondary | ICD-10-CM | POA: Diagnosis not present

## 2016-10-09 DIAGNOSIS — E7800 Pure hypercholesterolemia, unspecified: Secondary | ICD-10-CM | POA: Diagnosis not present

## 2016-10-09 DIAGNOSIS — E1151 Type 2 diabetes mellitus with diabetic peripheral angiopathy without gangrene: Secondary | ICD-10-CM | POA: Diagnosis not present

## 2016-10-10 ENCOUNTER — Encounter (HOSPITAL_COMMUNITY)
Admission: RE | Disposition: A | Discharge status: Home / Self Care | Payer: Self-pay | Source: Ambulatory Visit | Attending: Vascular Surgery

## 2016-10-10 ENCOUNTER — Other Ambulatory Visit: Payer: Self-pay | Admitting: *Deleted

## 2016-10-10 ENCOUNTER — Ambulatory Visit (HOSPITAL_COMMUNITY)
Admission: RE | Admit: 2016-10-10 | Discharge: 2016-10-10 | Disposition: A | Discharge status: Home / Self Care | Payer: Medicare Other | Source: Ambulatory Visit | Attending: Vascular Surgery | Admitting: Vascular Surgery

## 2016-10-10 DIAGNOSIS — Z833 Family history of diabetes mellitus: Secondary | ICD-10-CM | POA: Diagnosis not present

## 2016-10-10 DIAGNOSIS — Z79899 Other long term (current) drug therapy: Secondary | ICD-10-CM | POA: Diagnosis not present

## 2016-10-10 DIAGNOSIS — I4891 Unspecified atrial fibrillation: Secondary | ICD-10-CM | POA: Diagnosis not present

## 2016-10-10 DIAGNOSIS — I129 Hypertensive chronic kidney disease with stage 1 through stage 4 chronic kidney disease, or unspecified chronic kidney disease: Secondary | ICD-10-CM | POA: Diagnosis not present

## 2016-10-10 DIAGNOSIS — Z87891 Personal history of nicotine dependence: Secondary | ICD-10-CM | POA: Diagnosis not present

## 2016-10-10 DIAGNOSIS — I70235 Atherosclerosis of native arteries of right leg with ulceration of other part of foot: Secondary | ICD-10-CM | POA: Insufficient documentation

## 2016-10-10 DIAGNOSIS — I2510 Atherosclerotic heart disease of native coronary artery without angina pectoris: Secondary | ICD-10-CM | POA: Insufficient documentation

## 2016-10-10 DIAGNOSIS — L97519 Non-pressure chronic ulcer of other part of right foot with unspecified severity: Secondary | ICD-10-CM | POA: Insufficient documentation

## 2016-10-10 DIAGNOSIS — Z950 Presence of cardiac pacemaker: Secondary | ICD-10-CM | POA: Insufficient documentation

## 2016-10-10 DIAGNOSIS — Z7982 Long term (current) use of aspirin: Secondary | ICD-10-CM | POA: Insufficient documentation

## 2016-10-10 DIAGNOSIS — Z7901 Long term (current) use of anticoagulants: Secondary | ICD-10-CM | POA: Diagnosis not present

## 2016-10-10 DIAGNOSIS — I998 Other disorder of circulatory system: Secondary | ICD-10-CM | POA: Diagnosis not present

## 2016-10-10 DIAGNOSIS — E785 Hyperlipidemia, unspecified: Secondary | ICD-10-CM | POA: Diagnosis not present

## 2016-10-10 DIAGNOSIS — Z794 Long term (current) use of insulin: Secondary | ICD-10-CM | POA: Diagnosis not present

## 2016-10-10 DIAGNOSIS — F1010 Alcohol abuse, uncomplicated: Secondary | ICD-10-CM | POA: Insufficient documentation

## 2016-10-10 DIAGNOSIS — Z01810 Encounter for preprocedural cardiovascular examination: Secondary | ICD-10-CM

## 2016-10-10 DIAGNOSIS — Z8249 Family history of ischemic heart disease and other diseases of the circulatory system: Secondary | ICD-10-CM | POA: Insufficient documentation

## 2016-10-10 DIAGNOSIS — E1151 Type 2 diabetes mellitus with diabetic peripheral angiopathy without gangrene: Secondary | ICD-10-CM | POA: Diagnosis not present

## 2016-10-10 DIAGNOSIS — E1122 Type 2 diabetes mellitus with diabetic chronic kidney disease: Secondary | ICD-10-CM | POA: Diagnosis not present

## 2016-10-10 DIAGNOSIS — N189 Chronic kidney disease, unspecified: Secondary | ICD-10-CM | POA: Diagnosis not present

## 2016-10-10 DIAGNOSIS — I739 Peripheral vascular disease, unspecified: Secondary | ICD-10-CM

## 2016-10-10 HISTORY — PX: PERIPHERAL VASCULAR CATHETERIZATION: SHX172C

## 2016-10-10 LAB — POCT I-STAT, CHEM 8
BUN: 40 mg/dL — ABNORMAL HIGH (ref 6–20)
CALCIUM ION: 1.25 mmol/L (ref 1.15–1.40)
CREATININE: 1.5 mg/dL — AB (ref 0.61–1.24)
Chloride: 106 mmol/L (ref 101–111)
GLUCOSE: 223 mg/dL — AB (ref 65–99)
HCT: 33 % — ABNORMAL LOW (ref 39.0–52.0)
HEMOGLOBIN: 11.2 g/dL — AB (ref 13.0–17.0)
Potassium: 4.6 mmol/L (ref 3.5–5.1)
Sodium: 138 mmol/L (ref 135–145)
TCO2: 20 mmol/L (ref 0–100)

## 2016-10-10 LAB — GLUCOSE, CAPILLARY
GLUCOSE-CAPILLARY: 205 mg/dL — AB (ref 65–99)
GLUCOSE-CAPILLARY: 176 mg/dL — AB (ref 65–99)
Glucose-Capillary: 220 mg/dL — ABNORMAL HIGH (ref 65–99)

## 2016-10-10 LAB — POCT ACTIVATED CLOTTING TIME
ACTIVATED CLOTTING TIME: 169 s
Activated Clotting Time: 224 seconds
Activated Clotting Time: 208 seconds

## 2016-10-10 SURGERY — LOWER EXTREMITY ANGIOGRAPHY
Laterality: Right

## 2016-10-10 MED ORDER — HEPARIN (PORCINE) IN NACL 2-0.9 UNIT/ML-% IJ SOLN
INTRAMUSCULAR | Status: DC | PRN
  Administered 2016-10-10: 1000 mL via INTRA_ARTERIAL

## 2016-10-10 MED ORDER — OXYCODONE-ACETAMINOPHEN 5-325 MG PO TABS: 1.0000 | ORAL_TABLET | ORAL | Status: DC | PRN

## 2016-10-10 MED ORDER — CLOPIDOGREL BISULFATE 300 MG PO TABS
ORAL_TABLET | ORAL | Status: AC
  Filled 2016-10-10: qty 1

## 2016-10-10 MED ORDER — CLOPIDOGREL BISULFATE 75 MG PO TABS: 75.0000 mg | ORAL_TABLET | Freq: Every day | ORAL | 3 refills | Status: DC

## 2016-10-10 MED ORDER — HEPARIN SODIUM (PORCINE) 1000 UNIT/ML IJ SOLN
INTRAMUSCULAR | Status: DC | PRN
  Administered 2016-10-10: 3000 [IU] via INTRAVENOUS
  Administered 2016-10-10: 11000 [IU] via INTRAVENOUS

## 2016-10-10 MED ORDER — CLOPIDOGREL BISULFATE 75 MG PO TABS: 300.0000 mg | ORAL_TABLET | Freq: Once | ORAL | Status: DC

## 2016-10-10 MED ORDER — FENTANYL CITRATE (PF) 100 MCG/2ML IJ SOLN
INTRAMUSCULAR | Status: DC | PRN
  Administered 2016-10-10 (×2): 50 ug via INTRAVENOUS

## 2016-10-10 MED ORDER — LIDOCAINE HCL (PF) 1 % IJ SOLN
INTRAMUSCULAR | Status: AC
  Filled 2016-10-10: qty 30

## 2016-10-10 MED ORDER — IODIXANOL 320 MG/ML IV SOLN
INTRAVENOUS | Status: DC | PRN
  Administered 2016-10-10: 100 mL via INTRA_ARTERIAL

## 2016-10-10 MED ORDER — SODIUM CHLORIDE 0.9 % IV SOLN
1.0000 mL/kg/h | INTRAVENOUS | Status: DC
Start: 2016-10-10 — End: 2016-10-10

## 2016-10-10 MED ORDER — MIDAZOLAM HCL 2 MG/2ML IJ SOLN
INTRAMUSCULAR | Status: DC | PRN
Start: 2016-10-10 — End: 2016-10-10
  Administered 2016-10-10 (×3): 1 mg via INTRAVENOUS

## 2016-10-10 MED ORDER — MORPHINE SULFATE (PF) 4 MG/ML IV SOLN: 2.0000 mg | INTRAVENOUS | Status: DC | PRN

## 2016-10-10 MED ORDER — SODIUM CHLORIDE 0.9 % IV SOLN
INTRAVENOUS | Status: DC
  Administered 2016-10-10: 07:00:00 via INTRAVENOUS

## 2016-10-10 MED ORDER — FENTANYL CITRATE (PF) 100 MCG/2ML IJ SOLN
INTRAMUSCULAR | Status: AC
  Filled 2016-10-10: qty 2

## 2016-10-10 MED ORDER — HEPARIN (PORCINE) IN NACL 2-0.9 UNIT/ML-% IJ SOLN
INTRAMUSCULAR | Status: AC
  Filled 2016-10-10: qty 1000

## 2016-10-10 MED ORDER — VIPERSLIDE LUBRICANT OPTIME
TOPICAL | Status: DC | PRN
  Administered 2016-10-10: 10:00:00 via SURGICAL_CAVITY

## 2016-10-10 MED ORDER — MIDAZOLAM HCL 2 MG/2ML IJ SOLN
INTRAMUSCULAR | Status: AC
  Filled 2016-10-10: qty 2

## 2016-10-10 MED ORDER — LIDOCAINE HCL (PF) 1 % IJ SOLN
INTRAMUSCULAR | Status: DC | PRN
  Administered 2016-10-10: 10 mL via SUBCUTANEOUS

## 2016-10-10 MED ORDER — VIPERSLIDE LUBRICANT OPTIME: TOPICAL | Status: DC | PRN

## 2016-10-10 SURGICAL SUPPLY — 31 items
BAG SNAP BAND KOVER 36X36 (MISCELLANEOUS) ×3 IMPLANT
BALLN IN.PACT DCB 4X150 (BALLOONS) ×3
BALLN IN.PACT DCB 5X150 (BALLOONS) ×3
BUR JETSTREAM XC 2.1/3.0 (BURR) ×2 IMPLANT
BURR JETSTREAM XC 2.1/3.0 (BURR) ×3
CATH OMNI FLUSH 5F 65CM (CATHETERS) ×3 IMPLANT
CATH QUICKCROSS .035X135CM (MICROCATHETER) ×3 IMPLANT
CATH QUICKCROSS SUPP .035X90CM (MICROCATHETER) ×3 IMPLANT
COVER DOME SNAP 22 D (MISCELLANEOUS) ×3 IMPLANT
COVER PRB 48X5XTLSCP FOLD TPE (BAG) ×2 IMPLANT
COVER PROBE 5X48 (BAG) ×1
DCB IN.PACT 4X150 (BALLOONS) ×2 IMPLANT
DCB IN.PACT 5X150 (BALLOONS) ×2 IMPLANT
DEVICE CONTINUOUS FLUSH (MISCELLANEOUS) ×3 IMPLANT
DEVICE EMBOSHIELD NAV6 2.5-4.8 (WIRE) ×3 IMPLANT
GUIDEWIRE ANGLED .035X260CM (WIRE) ×3 IMPLANT
KIT ENCORE 26 ADVANTAGE (KITS) ×3 IMPLANT
KIT MICROINTRODUCER STIFF 5F (SHEATH) ×3 IMPLANT
KIT PV (KITS) ×3 IMPLANT
LUBRICANT VIPERSLIDE CORONARY (MISCELLANEOUS) ×3 IMPLANT
SHEATH HIGHFLEX ANSEL 7FR 55CM (SHEATH) ×3 IMPLANT
SHEATH PINNACLE 5F 10CM (SHEATH) ×3 IMPLANT
SHIELD RADPAD SCOOP 12X17 (MISCELLANEOUS) ×3 IMPLANT
SYR MEDRAD MARK V 150ML (SYRINGE) ×3 IMPLANT
TRANSDUCER W/STOPCOCK (MISCELLANEOUS) ×3 IMPLANT
TRAY PV CATH (CUSTOM PROCEDURE TRAY) ×3 IMPLANT
WIRE AMPLATZ SS-J .035X180CM (WIRE) ×3 IMPLANT
WIRE BAREWIRE WORK .014X315CM (WIRE) ×3 IMPLANT
WIRE BENTSON .035X145CM (WIRE) ×3 IMPLANT
WIRE HI TORQ VERSACORE J 260CM (WIRE) ×3 IMPLANT
WIRE ROSEN-J .035X260CM (WIRE) ×3 IMPLANT

## 2016-10-10 NOTE — Progress Notes (Signed)
On assessment left groin feels fuller, pressure held, Dr Donzetta Matters was notified, Tammy MInx was called to assist.

## 2016-10-10 NOTE — Op Note (Signed)
    Patient name: Alexander [name] MRN: [id number] DOB: [birth year] Sex: male  10/10/2016 Pre-operative Diagnosis: critical right lower extremity ischemia Post-operative diagnosis:  Same Surgeon:  Erlene Quan C. Donzetta Matters, MD Procedure Performed: 1.  US guided cannulation left common femoral artery 2.  Aortogram with right lower extremity runoff 3.  Jetstream atherectomy of Right sfa and popliteal arteries 4.  Drug coated balloon angioplasty of Right popliteal artery with 79mm impact 5.  Drug coated balloon angioplasty of Right superficial femoral artery with 59mm impact  Indications:  55 year old male with history of left lower Shiley bypasses and right popliteal intervention. He now has evidence of tight stenosis of his popliteal and has rest pain indicated for the above procedure.   Findings: There was a stenosis at this. There is a near occlusive lesion at the adductor. Following endarterectomy DCB there is brisk runoff dominated by the PT to the foot filling the arch. On the left side and possible cannulation of the graft at the common femoral artery.    Procedure:  The patient was identified in the holding area and taken to room 8.  He was sterilely prepped and draped in usual fashion timeout called. Began with ultrasound-guided identification of the left common femoral artery. This was punctured with micropuncture needle followed by a wire and sheath. We then passed a Benson wire followed by a 5 [place] sheath. An Omni catheter was used to perform aortogram. Then using Omni to go up and over Bentson wire and performed right lower extremity angiogram. We then placed a stiff wire into the right SFA and went up and over with a 7 French sheath parked in the right common femoral artery. Using Glidewire and quick cross catheter we were in able to cross the multiple stenoses in near occlusive lesion in the SFA and popliteal. We confirmed intraluminal access in the tibials. We then exchanged for 014 wire and placed  a an embo shield protection device. We then perform jetstream atherectomy of the right SFA and popliteal arteries. We exchanged for 035 wire after removing our embolic protection device and demonstrating significant calcium within it. Drug-coated balloon angioplasty with 4 mm at the popliteal and 5 mm in the SFA was used. Completion runoff as above demonstrated brisk runoff via the PT filling the pedal arch of right foot. Satisfied we withdrew our sheath performed a limited left lower extremity angiogram at the femoral level. Wire and catheter were removed sheath were made in place for possible and recovery area. Patient tolerated procedure well.  Contrast: 100 mL.  I administered 106 minutes of conscious sedation with fentanyl and Versed.  Mayjor Ager C. Donzetta Matters, MD V[facility] and Vein Specialists of [place] Office: [phone] Pager: [phone]

## 2016-10-10 NOTE — Progress Notes (Signed)
Site area: Insurance underwriter Prior to Removal:  Level site is firm and slightly puffy at sheath insertion site. A. Mitchell-RCIS observed groin with me for a second opinion. Scar tissue  from a graft is present. Pressure Applied For: 35 min Manual:   Hand , non- occlusive  Patient Status During Pull: A/O  Post Pull Site:  Level Lt groin is still firm and puffy. Edges of firm area are marked as a reference. Lt groin is unchanged from initial sheath pull. Post Pull Instructions Given:  Pt understands post instructions. Post Pull Pulses Present: Lt DP/PT Doppler///Rt DP/PT doppler Dressing Applied:  Tegaderm and a 4x4  Bedrest begins @ 13:40:00 Comments: Dr Donzetta Matters is paged to evaluate Lt groin. Dr Donzetta Matters is here to evaluate lt groin. Pulses are intact per doppler Lt\ dp/pt, Rt dp/pt per Dr Donzetta Matters. Pt may go to post procedure short stay and home once bedrest is over. Lt groin is unremarkable. Lt groin dressing is CDI. Pt leaves ha in stable condition.

## 2016-10-10 NOTE — Discharge Instructions (Signed)

## 2016-10-10 NOTE — H&P (Signed)
    History and Physical Update  The patient was interviewed and re-examined.  The patient's previous History and Physical has been reviewed and is unchanged from Dr. Trula Slade office visit. Aortogram with ble runoff and possible intervention.  Brandon C. Donzetta Matters, MD Vascular and Vein Specialists of [place] Office: [phone] Pager: [phone]   10/10/2016, 7:48 AM

## 2016-10-11 ENCOUNTER — Encounter (HOSPITAL_COMMUNITY): Payer: Self-pay | Admitting: Vascular Surgery

## 2016-10-11 MED FILL — Clopidogrel Bisulfate Tab 300 MG (Base Equiv): ORAL | Qty: 1 | Status: AC

## 2016-10-18 ENCOUNTER — Encounter (HOSPITAL_COMMUNITY)
Admission: RE | Admit: 2016-10-18 | Discharge: 2016-10-18 | Disposition: A | Discharge status: Home / Self Care | Payer: Medicare Other | Source: Ambulatory Visit | Attending: Nephrology | Admitting: Nephrology

## 2016-10-18 DIAGNOSIS — D631 Anemia in chronic kidney disease: Secondary | ICD-10-CM | POA: Insufficient documentation

## 2016-10-18 DIAGNOSIS — E1122 Type 2 diabetes mellitus with diabetic chronic kidney disease: Secondary | ICD-10-CM

## 2016-10-18 DIAGNOSIS — N183 Chronic kidney disease, stage 3 unspecified: Secondary | ICD-10-CM

## 2016-10-18 LAB — IRON AND TIBC
IRON: 82 ug/dL (ref 45–182)
Saturation Ratios: 34 % (ref 17.9–39.5)
TIBC: 242 ug/dL — AB (ref 250–450)
UIBC: 160 ug/dL

## 2016-10-18 LAB — POCT HEMOGLOBIN-HEMACUE: HEMOGLOBIN: 10.7 g/dL — AB (ref 13.0–17.0)

## 2016-10-18 LAB — FERRITIN: FERRITIN: 197 ng/mL (ref 24–336)

## 2016-10-18 MED ORDER — DARBEPOETIN ALFA 60 MCG/0.3ML IJ SOSY
PREFILLED_SYRINGE | INTRAMUSCULAR | Status: AC
  Filled 2016-10-18: qty 0.3

## 2016-10-18 MED ORDER — DARBEPOETIN ALFA 60 MCG/0.3ML IJ SOSY
60.0000 ug | PREFILLED_SYRINGE | INTRAMUSCULAR | Status: DC
  Administered 2016-10-18: 60 ug via SUBCUTANEOUS

## 2016-10-25 ENCOUNTER — Encounter (HOSPITAL_COMMUNITY)
Admission: RE | Admit: 2016-10-25 | Discharge: 2016-10-25 | Disposition: A | Discharge status: Home / Self Care | Payer: Medicare Other | Source: Ambulatory Visit | Attending: Nephrology | Admitting: Nephrology

## 2016-10-25 DIAGNOSIS — E1122 Type 2 diabetes mellitus with diabetic chronic kidney disease: Secondary | ICD-10-CM

## 2016-10-25 DIAGNOSIS — N183 Chronic kidney disease, stage 3 unspecified: Secondary | ICD-10-CM

## 2016-10-25 DIAGNOSIS — D631 Anemia in chronic kidney disease: Secondary | ICD-10-CM | POA: Diagnosis not present

## 2016-10-25 LAB — POCT HEMOGLOBIN-HEMACUE: Hemoglobin: 10.5 g/dL — ABNORMAL LOW (ref 13.0–17.0)

## 2016-10-25 MED ORDER — DARBEPOETIN ALFA 60 MCG/0.3ML IJ SOSY
60.0000 ug | PREFILLED_SYRINGE | INTRAMUSCULAR | Status: DC
  Administered 2016-10-25: 60 ug via SUBCUTANEOUS

## 2016-10-25 MED ORDER — DARBEPOETIN ALFA 60 MCG/0.3ML IJ SOSY
PREFILLED_SYRINGE | INTRAMUSCULAR | Status: AC
  Filled 2016-10-25: qty 0.3

## 2016-11-01 ENCOUNTER — Encounter (HOSPITAL_COMMUNITY)
Admission: RE | Admit: 2016-11-01 | Discharge: 2016-11-01 | Disposition: A | Discharge status: Home / Self Care | Payer: Medicare Other | Source: Ambulatory Visit | Attending: Nephrology | Admitting: Nephrology

## 2016-11-01 DIAGNOSIS — D631 Anemia in chronic kidney disease: Secondary | ICD-10-CM | POA: Diagnosis not present

## 2016-11-01 DIAGNOSIS — E1122 Type 2 diabetes mellitus with diabetic chronic kidney disease: Secondary | ICD-10-CM

## 2016-11-01 DIAGNOSIS — N183 Chronic kidney disease, stage 3 unspecified: Secondary | ICD-10-CM

## 2016-11-01 LAB — POCT HEMOGLOBIN-HEMACUE: HEMOGLOBIN: 11.5 g/dL — AB (ref 13.0–17.0)

## 2016-11-01 MED ORDER — DARBEPOETIN ALFA 60 MCG/0.3ML IJ SOSY
60.0000 ug | PREFILLED_SYRINGE | INTRAMUSCULAR | Status: DC
  Administered 2016-11-01: 60 ug via SUBCUTANEOUS

## 2016-11-01 MED ORDER — DARBEPOETIN ALFA 60 MCG/0.3ML IJ SOSY
PREFILLED_SYRINGE | INTRAMUSCULAR | Status: AC
  Filled 2016-11-01: qty 0.3

## 2016-11-02 ENCOUNTER — Encounter: Payer: Self-pay | Admitting: Surgery

## 2016-11-07 DIAGNOSIS — I739 Peripheral vascular disease, unspecified: Secondary | ICD-10-CM | POA: Diagnosis not present

## 2016-11-07 DIAGNOSIS — I10 Essential (primary) hypertension: Secondary | ICD-10-CM | POA: Diagnosis not present

## 2016-11-08 ENCOUNTER — Encounter (HOSPITAL_COMMUNITY)
Admission: RE | Admit: 2016-11-08 | Discharge: 2016-11-08 | Disposition: A | Discharge status: Home / Self Care | Payer: Medicare Other | Source: Ambulatory Visit | Attending: Nephrology | Admitting: Nephrology

## 2016-11-08 DIAGNOSIS — E1122 Type 2 diabetes mellitus with diabetic chronic kidney disease: Secondary | ICD-10-CM

## 2016-11-08 DIAGNOSIS — N183 Chronic kidney disease, stage 3 unspecified: Secondary | ICD-10-CM

## 2016-11-08 DIAGNOSIS — D631 Anemia in chronic kidney disease: Secondary | ICD-10-CM | POA: Diagnosis not present

## 2016-11-08 LAB — POCT HEMOGLOBIN-HEMACUE: HEMOGLOBIN: 11.3 g/dL — AB (ref 13.0–17.0)

## 2016-11-08 MED ORDER — DARBEPOETIN ALFA 60 MCG/0.3ML IJ SOSY
PREFILLED_SYRINGE | INTRAMUSCULAR | Status: AC
  Filled 2016-11-08: qty 0.3

## 2016-11-08 MED ORDER — DARBEPOETIN ALFA 60 MCG/0.3ML IJ SOSY
60.0000 ug | PREFILLED_SYRINGE | INTRAMUSCULAR | Status: DC
  Administered 2016-11-08: 60 ug via SUBCUTANEOUS

## 2016-11-15 ENCOUNTER — Inpatient Hospital Stay (HOSPITAL_COMMUNITY): Admission: RE | Admit: 2016-11-15 | Payer: Medicare Other | Source: Ambulatory Visit

## 2016-11-16 ENCOUNTER — Encounter (HOSPITAL_BASED_OUTPATIENT_CLINIC_OR_DEPARTMENT_OTHER): Payer: Medicare Other | Attending: Internal Medicine

## 2016-11-16 ENCOUNTER — Ambulatory Visit (HOSPITAL_COMMUNITY)
Admission: RE | Admit: 2016-11-16 | Discharge: 2016-11-16 | Disposition: A | Discharge status: Home / Self Care | Payer: Medicare Other | Source: Ambulatory Visit | Attending: Internal Medicine | Admitting: Internal Medicine

## 2016-11-16 ENCOUNTER — Encounter (HOSPITAL_COMMUNITY): Payer: Self-pay

## 2016-11-16 ENCOUNTER — Other Ambulatory Visit: Payer: Self-pay | Admitting: Internal Medicine

## 2016-11-16 DIAGNOSIS — L97511 Non-pressure chronic ulcer of other part of right foot limited to breakdown of skin: Secondary | ICD-10-CM | POA: Diagnosis not present

## 2016-11-16 DIAGNOSIS — I2510 Atherosclerotic heart disease of native coronary artery without angina pectoris: Secondary | ICD-10-CM | POA: Insufficient documentation

## 2016-11-16 DIAGNOSIS — I10 Essential (primary) hypertension: Secondary | ICD-10-CM | POA: Insufficient documentation

## 2016-11-16 DIAGNOSIS — Z87891 Personal history of nicotine dependence: Secondary | ICD-10-CM | POA: Diagnosis not present

## 2016-11-16 DIAGNOSIS — E1142 Type 2 diabetes mellitus with diabetic polyneuropathy: Secondary | ICD-10-CM | POA: Insufficient documentation

## 2016-11-16 DIAGNOSIS — E1151 Type 2 diabetes mellitus with diabetic peripheral angiopathy without gangrene: Secondary | ICD-10-CM | POA: Diagnosis not present

## 2016-11-16 DIAGNOSIS — M86171 Other acute osteomyelitis, right ankle and foot: Secondary | ICD-10-CM

## 2016-11-16 DIAGNOSIS — S91104A Unspecified open wound of right lesser toe(s) without damage to nail, initial encounter: Secondary | ICD-10-CM | POA: Diagnosis not present

## 2016-11-16 DIAGNOSIS — E11621 Type 2 diabetes mellitus with foot ulcer: Secondary | ICD-10-CM | POA: Insufficient documentation

## 2016-11-16 DIAGNOSIS — L97512 Non-pressure chronic ulcer of other part of right foot with fat layer exposed: Secondary | ICD-10-CM | POA: Diagnosis not present

## 2016-11-16 DIAGNOSIS — Z89432 Acquired absence of left foot: Secondary | ICD-10-CM | POA: Insufficient documentation

## 2016-11-16 DIAGNOSIS — I739 Peripheral vascular disease, unspecified: Secondary | ICD-10-CM | POA: Diagnosis not present

## 2016-11-19 ENCOUNTER — Encounter (HOSPITAL_COMMUNITY): Payer: Medicare Other

## 2016-11-21 ENCOUNTER — Encounter (HOSPITAL_COMMUNITY)
Admission: RE | Admit: 2016-11-21 | Discharge: 2016-11-21 | Disposition: A | Discharge status: Home / Self Care | Payer: Medicare Other | Source: Ambulatory Visit | Attending: Nephrology | Admitting: Nephrology

## 2016-11-21 DIAGNOSIS — N183 Chronic kidney disease, stage 3 unspecified: Secondary | ICD-10-CM

## 2016-11-21 DIAGNOSIS — E1165 Type 2 diabetes mellitus with hyperglycemia: Secondary | ICD-10-CM | POA: Diagnosis not present

## 2016-11-21 DIAGNOSIS — D631 Anemia in chronic kidney disease: Secondary | ICD-10-CM | POA: Insufficient documentation

## 2016-11-21 DIAGNOSIS — E1122 Type 2 diabetes mellitus with diabetic chronic kidney disease: Secondary | ICD-10-CM

## 2016-11-21 LAB — IRON AND TIBC
Iron: 79 ug/dL (ref 45–182)
Saturation Ratios: 28 % (ref 17.9–39.5)
TIBC: 280 ug/dL (ref 250–450)
UIBC: 201 ug/dL

## 2016-11-21 LAB — POCT HEMOGLOBIN-HEMACUE: HEMOGLOBIN: 12.4 g/dL — AB (ref 13.0–17.0)

## 2016-11-21 LAB — FERRITIN: Ferritin: 102 ng/mL (ref 24–336)

## 2016-11-21 MED ORDER — DARBEPOETIN ALFA 60 MCG/0.3ML IJ SOSY: 60.0000 ug | PREFILLED_SYRINGE | INTRAMUSCULAR | Status: DC

## 2016-11-26 DIAGNOSIS — E11621 Type 2 diabetes mellitus with foot ulcer: Secondary | ICD-10-CM | POA: Diagnosis not present

## 2016-11-26 DIAGNOSIS — S91301A Unspecified open wound, right foot, initial encounter: Secondary | ICD-10-CM | POA: Diagnosis not present

## 2016-11-29 ENCOUNTER — Encounter: Payer: Self-pay | Admitting: Surgery

## 2016-12-03 DIAGNOSIS — E11621 Type 2 diabetes mellitus with foot ulcer: Secondary | ICD-10-CM | POA: Diagnosis not present

## 2016-12-03 DIAGNOSIS — L97511 Non-pressure chronic ulcer of other part of right foot limited to breakdown of skin: Secondary | ICD-10-CM | POA: Diagnosis not present

## 2016-12-05 ENCOUNTER — Ambulatory Visit (HOSPITAL_COMMUNITY)
Admission: RE | Admit: 2016-12-05 | Discharge: 2016-12-05 | Disposition: A | Discharge status: Home / Self Care | Payer: Medicare Other | Source: Ambulatory Visit | Attending: Surgery | Admitting: Surgery

## 2016-12-05 ENCOUNTER — Ambulatory Visit (INDEPENDENT_AMBULATORY_CARE_PROVIDER_SITE_OTHER): Payer: Medicare Other | Admitting: Physician Assistant

## 2016-12-05 VITALS — BP 151/79 | HR 77 | Temp 99.0°F | Resp 16 | Ht 72.0 in | Wt 260.0 lb

## 2016-12-05 DIAGNOSIS — Z01810 Encounter for preprocedural cardiovascular examination: Secondary | ICD-10-CM | POA: Insufficient documentation

## 2016-12-05 DIAGNOSIS — I739 Peripheral vascular disease, unspecified: Secondary | ICD-10-CM

## 2016-12-05 LAB — VAS US LOWER EXTREMITY ARTERIAL DUPLEX
RATIBDISTSYS: 94 cm/s
RIGHT POST TIB DIST SYS: 116 cm/s
RSFDPSV: -128 cm/s
RSFMPSV: -134 cm/s
RSFPPSV: 211 cm/s

## 2016-12-05 NOTE — Progress Notes (Signed)
Vitals:   12/05/16 1432  BP: (!) 155/81  Pulse: 81  Resp: 16  Temp: 99 F (37.2 C)  SpO2: 97%  Weight: 260 lb (117.9 kg)  Height: 6' (1.829 m)

## 2016-12-05 NOTE — Progress Notes (Addendum)
HPI:  This is a 56 y.o. male who is s/p Surgeon:  Erlene Quan C. Donzetta Matters, MD Procedure Performed: 1.  US guided cannulation left common femoral artery 2.  Aortogram with right lower extremity runoff 3.  Jetstream atherectomy of Right sfa and popliteal arteries 4.  Drug coated balloon angioplasty of Right popliteal artery with 21m impact 5.  Drug coated balloon angioplasty of Right superficial femoral artery with 572mimpact  KiEVREN SHANKLANDs a [age].o. male patient of Dr. DiScot Dockho had initially presented with extensive gangrene of his left foot. He underwent a fifth toe amputation which failed to heal. He had multilevel arterial occlusive disease. On 10/21/2013, he underwent a left common femoral artery to tibial peroneal trunk bypass with a vein graft and Ray amputation of his left fourth toe.  He ultimately required a transmetatarsal amputation on the left (Dr GrBerenice Primas   He presented to the office with a new left 5th toe ulcer and dusky discoloration to his left foot.  ABI's on the right 0.34 on 10/08/2016.   He has been going to the wound center  And was released this last week secondary to 90%  healing of the ulcer on the left 5th toe.  He continues to wear a dry dressing between the 4th and 5th toes.  He is currently on Eliquis and plavix post angioplasty of Coronary arteries.  He manages his HTN with Lisinopril and Metoprolol, as well as takes Lipitor daily.       Past Medical History:  Diagnosis Date  . Alcohol abuse   . Anemia   . Arthritis   . Atrial fibrillation ([place]  . Cardiac syncope 10/07/14   rapid A fib with 8 sec pauses on converison with syncope- temp pacing wire placed then PPM  . Cataract    BILATERAL  . Chronic kidney disease   . Coronary artery disease   . Diabetes mellitus    dx---been  awhile  . Hyperlipemia   . Hypertension   . Peripheral vascular disease ([place]  . Presence of permanent cardiac pacemaker 10/08/2014    Past Surgical History:  Procedure  Laterality Date  . AMPUTATION Left 08/19/2013   Procedure: AMPUTATION RAY;  Surgeon: JoAlta CorningMD;  [facility];  Laterality: Left;  ray amputation left 5th  . AMPUTATION Left 10/27/2013   Procedure: AMPUTATION DIGIT-LEFT 4TH TOE, 4th and 5th metatarsal.;  Surgeon: ChAngelia MouldMD;  [facility];  Laterality: Left;  . AMPUTATION Left 11/04/2013   Procedure: LEFT FOOT TRANS-METATARSAL AMPUTATION WITH WOUND CLOSURE ;  Surgeon: JoAlta CorningMD;  [facility];  Laterality: Left;  . CARDIAC CATHETERIZATION  10/07/2014   Procedure: LEFT HEART CATH AND CORONARY ANGIOGRAPHY;  Surgeon: JaLaverda PageMD;  [facility];  Service: Cardiovascular;;  . FEMORAL-TIBIAL BYPASS GRAFT Left 10/27/2013   Procedure: BYPASS GRAFT LEFT FEMORAL- POSTERIOR TIBIAL ARTERY;  Surgeon: ChAngelia MouldMD;  [facility];  Laterality: Left;  . I&D EXTREMITY Left 10/27/2013   Procedure: IRRIGATION AND DEBRIDEMENT EXTREMITY- LEFT FOOT;  Surgeon: ChAngelia MouldMD;  [facility]: [facility];  Laterality: Left;  . LEFT HEART CATHETERIZATION WITH CORONARY ANGIOGRAM N/A 11/09/2014   Procedure: LEFT HEART CATHETERIZATION WITH CORONARY ANGIOGRAM;  Surgeon: JaLaverda PageMD;  [facility];  Service: Cardiovascular;  Laterality: N/A;  . LOWER EXTREMITY ANGIOGRAM  Right 11/08/2015   Procedure: Lower Extremity Angiogram;  Surgeon: Serafina Mitchell, MD;  Location: [place] CV LAB;  Service: Cardiovascular;  Laterality: Right;  . LUMBAR LAMINECTOMY  06/13/2012   Procedure: MICRODISCECTOMY LUMBAR LAMINECTOMY;  Surgeon: Jessy Oto, MD;  Location: [place];  Service: Orthopedics;  Laterality: N/A;  Central laminectomy L2-3, L3-4, L4-5  . PACEMAKER INSERTION  10/08/14   MDT Advisa MRI compatible dual chamber pacemaker implanted by Dr Caryl Comes for syncope with post-termination pauses    . PERCUTANEOUS CORONARY STENT INTERVENTION (PCI-S)  11/09/2014   des to lad & distal circumflex         Dr  [name]  . PERIPHERAL VASCULAR CATHETERIZATION N/A 11/08/2015   Procedure: Abdominal Aortogram;  Surgeon: Serafina Mitchell, MD;  Location: [place] CV LAB;  Service: Cardiovascular;  Laterality: N/A;  . PERIPHERAL VASCULAR CATHETERIZATION Right 11/08/2015   Procedure: Peripheral Vascular Atherectomy;  Surgeon: Serafina Mitchell, MD;  Location: [place] CV LAB;  Service: Cardiovascular;  Laterality: Right;  . PERIPHERAL VASCULAR CATHETERIZATION N/A 10/10/2016   Procedure: Lower Extremity Angiography;  Surgeon: Waynetta Sandy, MD;  Location: [place] CV LAB;  Service: Cardiovascular;  Laterality: N/A;  . PERIPHERAL VASCULAR CATHETERIZATION Right 10/10/2016   Procedure: Peripheral Vascular Atherectomy;  Surgeon: Waynetta Sandy, MD;  Location: [place] CV LAB;  Service: Cardiovascular;  Laterality: Right;  Popliteal  . PERMANENT PACEMAKER INSERTION N/A 10/08/2014   Procedure: PERMANENT PACEMAKER INSERTION;  Surgeon: Deboraha Sprang, MD;  [facility];  Service: Cardiovascular;  Laterality: N/A;  . TEMPORARY PACEMAKER INSERTION Bilateral 10/07/2014   Procedure: TEMPORARY PACEMAKER INSERTION;  Surgeon: Laverda Page, MD;  Location: [facility] CATH LAB;  Service: Cardiovascular;  Laterality: Bilateral;    ROS:   General:  No weight loss, Fever, chills  HEENT: No recent headaches, no nasal bleeding, no visual changes, no sore throat  Neurologic: No dizziness, blackouts, seizures. No recent symptoms of stroke or mini- stroke. No recent episodes of slurred speech, or temporary blindness.  Cardiac: No recent episodes of chest pain/pressure, no shortness of breath at rest.  No shortness of breath with exertion.  Denies history of atrial fibrillation or irregular heartbeat  Vascular: No history of rest pain in feet.  No history of claudication.  Positive history of  non-healing ulcer, No history of DVT   Pulmonary: No home oxygen, no productive cough, no hemoptysis,  No asthma or wheezing  Musculoskeletal:  _0  Arthritis, _1  Low back pain,  _2  Joint pain  Hematologic:No history of hypercoagulable state.  No history of easy bleeding.  No history of anemia  Gastrointestinal: No hematochezia or melena,  No gastroesophageal reflux, no trouble swallowing  Urinary: _3  chronic Kidney disease, _4  on HD - _5  MWF or _6  TTHS, _7  Burning with urination, _8  Frequent urination, _9  Difficulty urinating;   Skin: No rashes  Psychological: No history of anxiety,  No history of depression  Social History Social History  Substance Use Topics  . Smoking status: Former Smoker    Packs/day: 1.50    Years: 30.00    Types: Cigars    Quit date: 10/07/2014  . Smokeless tobacco: Never Used  . Alcohol use 21.0 oz/week    35 Cans of beer per week     Comment: 6 of 12 oz beers a day    Family History Family History  Problem Relation Age of Onset  . Diabetes type  II Mother   . Hypertension Mother   . Diabetes Mother   . Liver cancer Father   . Diabetes type II Sister   . Breast cancer Sister   . Diabetes Sister   . Hypertension Sister   . Diabetes type II Brother   . Kidney failure Brother   . Diabetes Brother   . Hypertension Brother   . Diabetes type II Sister     Allergies  Allergies  Allergen Reactions  . Sulfa Antibiotics Rash     Current Outpatient Prescriptions  Medication Sig Dispense Refill  . ACCU-CHEK FASTCLIX LANCETS MISC Check blood sugar TID & QHS 102 each 2  . amiodarone (PACERONE) 400 MG tablet Take 0.5 tablets (200 mg total) by mouth daily. 60 tablet 0  . amLODipine (NORVASC) 10 MG tablet Take 1 tablet (10 mg total) by mouth daily. 30 tablet 2  . apixaban (ELIQUIS) 5 MG TABS tablet Take 1 tablet (5 mg total) by mouth 2 (two) times daily. 180 tablet 3  . atorvastatin (LIPITOR) 20 MG tablet Take 20 tablets by mouth daily.  2   . blood glucose meter kit and supplies KIT Check blood sugar TID & QHS 1 each 0  . Blood Glucose Monitoring Suppl (ACCU-CHEK ADVANTAGE DIABETES) kit Use as instructed 1 each 0  . chlorthalidone (HYGROTON) 50 MG tablet Take 50 mg by mouth daily.    . clopidogrel (PLAVIX) 75 MG tablet Take 1 tablet (75 mg total) by mouth daily with breakfast. 60 tablet 6  . clopidogrel (PLAVIX) 75 MG tablet Take 1 tablet (75 mg total) by mouth daily. 30 tablet 3  . GlucoCom Lancets MISC Check blood sugar TID & QHS 100 each 0  . glucose blood (ACCU-CHEK AVIVA PLUS) test strip Use as instructed 100 each 12  . glucose blood (CHOICE DM FORA G20 TEST STRIPS) test strip Check blood sugar TID & QHS 100 each 12  . glucose blood test strip Use as instructed 100 each 12  . hydrALAZINE (APRESOLINE) 100 MG tablet Take 100 mg by mouth 3 (three) times daily.    . Insulin Lispro Prot & Lispro (HUMALOG MIX 75/25 KWIKPEN) (75-25) 100 UNIT/ML Kwikpen Inject 46-56 Units into the skin 2 (two) times daily with a meal. Inject 56units subcutaneously before breakfast and 46units before supper    . lisinopril (PRINIVIL,ZESTRIL) 10 MG tablet Take 10 mg by mouth daily.   0  . metoprolol (LOPRESSOR) 100 MG tablet Take 100 mg by mouth 2 (two) times daily.    . Multiple Vitamin (MULTIVITAMIN WITH MINERALS) TABS tablet Take 1 tablet by mouth daily.    Marland Kitchen ofloxacin (OCUFLOX) 0.3 % ophthalmic solution Place 1 drop into the left eye 4 (four) times daily. Continue until he follow-up appointment.  0  . prednisoLONE acetate (PRED FORTE) 1 % ophthalmic suspension Place 1 drop into the left eye 4 (four) times daily.  0   No current facility-administered medications for this visit.     Physical Examination  Vitals:   12/05/16 1432 12/05/16 1435  BP: (!) 155/81 (!) 151/79  Pulse: 81 77  Resp: 16   Temp: 99 F (37.2 C)   SpO2: 97%   Weight: 260 lb (117.9 kg)   Height: 6' (1.829 m)     Body mass index is 35.26 kg/m.  General:  Alert and  oriented, no acute distress HEENT: Normal Neck: No bruit or JVD Pulmonary: Clear to auscultation bilaterally Cardiac: Regular Rate and Rhythm without murmur Abdomen: Soft, non-tender,  non-distended, no mass, no scars Skin: No rash Extremity Pulses: left residual transmetatarsal foot is warm and right foot is warm to touch no palpable pulses in B feet.  Palpable femoral pulses B Musculoskeletal: No edema  Neurologic: Upper and lower extremity motor 5/5 and symmetric  DATA:  Arterial duplex Right 0.81 with monophasic flow in the tibial arteries Left ABI 0.86 Biphasic  Patent CFA and SFA S/P angioplasty right LE   ASSESSMENT:  PAD with healing ulcer right fifth toe s/p  Procedure Performed: 1.  US guided cannulation left common femoral artery 2.  Aortogram with right lower extremity runoff 3.  Jetstream atherectomy of Right sfa and popliteal arteries 4.  Drug coated balloon angioplasty of Right popliteal artery with 37m impact 5.  Drug coated balloon angioplasty of Right superficial femoral artery with 557mimpact   PLAN:  Improved arterial flow right LE with healing ulcer right foot.  Continue Eliquis and Plavix .  Dry dressing to right fifth toe.  Stable left LE s/p transmetatarsal amputation.   I will schedule him to f/u in 3 months for a repeat ABI's and arterial duplex bilaterally.  If he has problems he will call sooner.    COTheda SersEMMA MAUREEN PA-C [facility] [place] [place] in clinic today

## 2016-12-06 ENCOUNTER — Encounter (HOSPITAL_COMMUNITY)
Admission: RE | Admit: 2016-12-06 | Discharge: 2016-12-06 | Disposition: A | Discharge status: Home / Self Care | Payer: Medicare Other | Source: Ambulatory Visit | Attending: Nephrology | Admitting: Nephrology

## 2016-12-06 DIAGNOSIS — N183 Chronic kidney disease, stage 3 unspecified: Secondary | ICD-10-CM

## 2016-12-06 DIAGNOSIS — D631 Anemia in chronic kidney disease: Secondary | ICD-10-CM | POA: Diagnosis not present

## 2016-12-06 DIAGNOSIS — E1122 Type 2 diabetes mellitus with diabetic chronic kidney disease: Secondary | ICD-10-CM

## 2016-12-06 LAB — POCT HEMOGLOBIN-HEMACUE: Hemoglobin: 11.3 g/dL — ABNORMAL LOW (ref 13.0–17.0)

## 2016-12-06 MED ORDER — DARBEPOETIN ALFA 60 MCG/0.3ML IJ SOSY
PREFILLED_SYRINGE | INTRAMUSCULAR | Status: AC
  Filled 2016-12-06: qty 0.3

## 2016-12-06 MED ORDER — DARBEPOETIN ALFA 60 MCG/0.3ML IJ SOSY
60.0000 ug | PREFILLED_SYRINGE | INTRAMUSCULAR | Status: DC
  Administered 2016-12-06: 60 ug via SUBCUTANEOUS

## 2016-12-10 NOTE — Addendum Note (Signed)
Addended by: Lianne Cure A on: 12/10/2016 11:45 AM   Modules accepted: Orders

## 2016-12-20 ENCOUNTER — Encounter (HOSPITAL_COMMUNITY)
Admission: RE | Admit: 2016-12-20 | Discharge: 2016-12-20 | Disposition: A | Discharge status: Home / Self Care | Payer: Medicare Other | Source: Ambulatory Visit | Attending: Nephrology | Admitting: Nephrology

## 2016-12-20 DIAGNOSIS — N183 Chronic kidney disease, stage 3 unspecified: Secondary | ICD-10-CM

## 2016-12-20 DIAGNOSIS — E1122 Type 2 diabetes mellitus with diabetic chronic kidney disease: Secondary | ICD-10-CM

## 2016-12-20 DIAGNOSIS — D631 Anemia in chronic kidney disease: Secondary | ICD-10-CM | POA: Insufficient documentation

## 2016-12-20 DIAGNOSIS — Z950 Presence of cardiac pacemaker: Secondary | ICD-10-CM | POA: Diagnosis not present

## 2016-12-20 LAB — IRON AND TIBC
IRON: 78 ug/dL (ref 45–182)
Saturation Ratios: 30 % (ref 17.9–39.5)
TIBC: 258 ug/dL (ref 250–450)
UIBC: 180 ug/dL

## 2016-12-20 LAB — FERRITIN: Ferritin: 142 ng/mL (ref 24–336)

## 2016-12-20 LAB — POCT HEMOGLOBIN-HEMACUE: HEMOGLOBIN: 11.5 g/dL — AB (ref 13.0–17.0)

## 2016-12-20 MED ORDER — DARBEPOETIN ALFA 60 MCG/0.3ML IJ SOSY
PREFILLED_SYRINGE | INTRAMUSCULAR | Status: AC
  Filled 2016-12-20: qty 0.3

## 2016-12-20 MED ORDER — DARBEPOETIN ALFA 60 MCG/0.3ML IJ SOSY
60.0000 ug | PREFILLED_SYRINGE | INTRAMUSCULAR | Status: DC
  Administered 2016-12-20: 60 ug via SUBCUTANEOUS

## 2017-01-03 ENCOUNTER — Encounter (HOSPITAL_COMMUNITY)
Admission: RE | Admit: 2017-01-03 | Discharge: 2017-01-03 | Disposition: A | Discharge status: Home / Self Care | Payer: Medicare Other | Source: Ambulatory Visit | Attending: Nephrology | Admitting: Nephrology

## 2017-01-03 DIAGNOSIS — N183 Chronic kidney disease, stage 3 unspecified: Secondary | ICD-10-CM

## 2017-01-03 DIAGNOSIS — E1122 Type 2 diabetes mellitus with diabetic chronic kidney disease: Secondary | ICD-10-CM

## 2017-01-03 DIAGNOSIS — D631 Anemia in chronic kidney disease: Secondary | ICD-10-CM | POA: Diagnosis not present

## 2017-01-03 LAB — POCT HEMOGLOBIN-HEMACUE: Hemoglobin: 11.7 g/dL — ABNORMAL LOW (ref 13.0–17.0)

## 2017-01-03 MED ORDER — DARBEPOETIN ALFA 60 MCG/0.3ML IJ SOSY
60.0000 ug | PREFILLED_SYRINGE | INTRAMUSCULAR | Status: DC
  Administered 2017-01-03: 60 ug via SUBCUTANEOUS

## 2017-01-03 MED ORDER — DARBEPOETIN ALFA 60 MCG/0.3ML IJ SOSY
PREFILLED_SYRINGE | INTRAMUSCULAR | Status: AC
  Filled 2017-01-03: qty 0.3

## 2017-01-17 ENCOUNTER — Encounter (HOSPITAL_COMMUNITY)
Admission: RE | Admit: 2017-01-17 | Discharge: 2017-01-17 | Disposition: A | Discharge status: Home / Self Care | Payer: Medicare Other | Source: Ambulatory Visit | Attending: Nephrology | Admitting: Nephrology

## 2017-01-17 DIAGNOSIS — N183 Chronic kidney disease, stage 3 unspecified: Secondary | ICD-10-CM

## 2017-01-17 DIAGNOSIS — D631 Anemia in chronic kidney disease: Secondary | ICD-10-CM | POA: Diagnosis not present

## 2017-01-17 DIAGNOSIS — E1122 Type 2 diabetes mellitus with diabetic chronic kidney disease: Secondary | ICD-10-CM

## 2017-01-17 LAB — IRON AND TIBC
IRON: 74 ug/dL (ref 45–182)
Saturation Ratios: 28 % (ref 17.9–39.5)
TIBC: 263 ug/dL (ref 250–450)
UIBC: 189 ug/dL

## 2017-01-17 LAB — POCT HEMOGLOBIN-HEMACUE: HEMOGLOBIN: 12.1 g/dL — AB (ref 13.0–17.0)

## 2017-01-17 LAB — FERRITIN: Ferritin: 132 ng/mL (ref 24–336)

## 2017-01-17 MED ORDER — DARBEPOETIN ALFA 60 MCG/0.3ML IJ SOSY: 60.0000 ug | PREFILLED_SYRINGE | INTRAMUSCULAR | Status: DC

## 2017-01-31 ENCOUNTER — Encounter (HOSPITAL_COMMUNITY)
Admission: RE | Admit: 2017-01-31 | Discharge: 2017-01-31 | Disposition: A | Discharge status: Home / Self Care | Payer: Medicare Other | Source: Ambulatory Visit | Attending: Nephrology | Admitting: Nephrology

## 2017-01-31 DIAGNOSIS — D631 Anemia in chronic kidney disease: Secondary | ICD-10-CM | POA: Diagnosis not present

## 2017-01-31 DIAGNOSIS — N183 Chronic kidney disease, stage 3 unspecified: Secondary | ICD-10-CM

## 2017-01-31 DIAGNOSIS — E1122 Type 2 diabetes mellitus with diabetic chronic kidney disease: Secondary | ICD-10-CM

## 2017-01-31 LAB — POCT HEMOGLOBIN-HEMACUE: Hemoglobin: 11.3 g/dL — ABNORMAL LOW (ref 13.0–17.0)

## 2017-01-31 MED ORDER — DARBEPOETIN ALFA 60 MCG/0.3ML IJ SOSY
60.0000 ug | PREFILLED_SYRINGE | INTRAMUSCULAR | Status: DC
  Administered 2017-01-31: 60 ug via SUBCUTANEOUS

## 2017-01-31 MED ORDER — DARBEPOETIN ALFA 60 MCG/0.3ML IJ SOSY
PREFILLED_SYRINGE | INTRAMUSCULAR | Status: AC
  Filled 2017-01-31: qty 0.3

## 2017-02-05 DIAGNOSIS — I480 Paroxysmal atrial fibrillation: Secondary | ICD-10-CM | POA: Diagnosis not present

## 2017-02-05 DIAGNOSIS — I739 Peripheral vascular disease, unspecified: Secondary | ICD-10-CM | POA: Diagnosis not present

## 2017-02-05 DIAGNOSIS — I25119 Atherosclerotic heart disease of native coronary artery with unspecified angina pectoris: Secondary | ICD-10-CM | POA: Diagnosis not present

## 2017-02-05 DIAGNOSIS — I10 Essential (primary) hypertension: Secondary | ICD-10-CM | POA: Diagnosis not present

## 2017-02-14 ENCOUNTER — Encounter (HOSPITAL_COMMUNITY)
Admission: RE | Admit: 2017-02-14 | Discharge: 2017-02-14 | Disposition: A | Discharge status: Home / Self Care | Payer: Medicare Other | Source: Ambulatory Visit | Attending: Nephrology | Admitting: Nephrology

## 2017-02-14 DIAGNOSIS — N183 Chronic kidney disease, stage 3 unspecified: Secondary | ICD-10-CM

## 2017-02-14 DIAGNOSIS — E1122 Type 2 diabetes mellitus with diabetic chronic kidney disease: Secondary | ICD-10-CM

## 2017-02-14 DIAGNOSIS — D631 Anemia in chronic kidney disease: Secondary | ICD-10-CM | POA: Insufficient documentation

## 2017-02-14 LAB — IRON AND TIBC
IRON: 83 ug/dL (ref 45–182)
Saturation Ratios: 31 % (ref 17.9–39.5)
TIBC: 265 ug/dL (ref 250–450)
UIBC: 182 ug/dL

## 2017-02-14 LAB — POCT HEMOGLOBIN-HEMACUE: HEMOGLOBIN: 11.6 g/dL — AB (ref 13.0–17.0)

## 2017-02-14 LAB — FERRITIN: FERRITIN: 159 ng/mL (ref 24–336)

## 2017-02-14 MED ORDER — DARBEPOETIN ALFA 60 MCG/0.3ML IJ SOSY
60.0000 ug | PREFILLED_SYRINGE | INTRAMUSCULAR | Status: DC
  Administered 2017-02-14: 60 ug via SUBCUTANEOUS

## 2017-02-14 MED ORDER — DARBEPOETIN ALFA 60 MCG/0.3ML IJ SOSY
PREFILLED_SYRINGE | INTRAMUSCULAR | Status: AC
  Filled 2017-02-14: qty 0.3

## 2017-02-21 ENCOUNTER — Encounter: Payer: Self-pay | Admitting: Surgery

## 2017-02-25 DIAGNOSIS — E1151 Type 2 diabetes mellitus with diabetic peripheral angiopathy without gangrene: Secondary | ICD-10-CM | POA: Diagnosis not present

## 2017-02-25 DIAGNOSIS — Z95820 Peripheral vascular angioplasty status with implants and grafts: Secondary | ICD-10-CM | POA: Diagnosis not present

## 2017-02-25 DIAGNOSIS — N183 Chronic kidney disease, stage 3 (moderate): Secondary | ICD-10-CM | POA: Diagnosis not present

## 2017-02-25 DIAGNOSIS — Z950 Presence of cardiac pacemaker: Secondary | ICD-10-CM | POA: Diagnosis not present

## 2017-02-28 ENCOUNTER — Encounter (HOSPITAL_COMMUNITY)
Admission: RE | Admit: 2017-02-28 | Discharge: 2017-02-28 | Disposition: A | Discharge status: Home / Self Care | Payer: Medicare Other | Source: Ambulatory Visit | Attending: Nephrology | Admitting: Nephrology

## 2017-02-28 DIAGNOSIS — D631 Anemia in chronic kidney disease: Secondary | ICD-10-CM | POA: Diagnosis not present

## 2017-02-28 DIAGNOSIS — E1122 Type 2 diabetes mellitus with diabetic chronic kidney disease: Secondary | ICD-10-CM

## 2017-02-28 DIAGNOSIS — N183 Chronic kidney disease, stage 3 unspecified: Secondary | ICD-10-CM

## 2017-02-28 LAB — POCT HEMOGLOBIN-HEMACUE: Hemoglobin: 10.7 g/dL — ABNORMAL LOW (ref 13.0–17.0)

## 2017-02-28 MED ORDER — DARBEPOETIN ALFA 60 MCG/0.3ML IJ SOSY
PREFILLED_SYRINGE | INTRAMUSCULAR | Status: AC
  Filled 2017-02-28: qty 0.3

## 2017-02-28 MED ORDER — DARBEPOETIN ALFA 60 MCG/0.3ML IJ SOSY
60.0000 ug | PREFILLED_SYRINGE | INTRAMUSCULAR | Status: DC
  Administered 2017-02-28: 60 ug via SUBCUTANEOUS

## 2017-03-04 ENCOUNTER — Ambulatory Visit (INDEPENDENT_AMBULATORY_CARE_PROVIDER_SITE_OTHER): Payer: Medicare Other | Admitting: Physician Assistant

## 2017-03-04 ENCOUNTER — Ambulatory Visit (INDEPENDENT_AMBULATORY_CARE_PROVIDER_SITE_OTHER)
Admission: RE | Admit: 2017-03-04 | Discharge: 2017-03-04 | Disposition: A | Discharge status: Home / Self Care | Payer: Medicare Other | Source: Ambulatory Visit | Attending: Surgery | Admitting: Surgery

## 2017-03-04 ENCOUNTER — Ambulatory Visit (HOSPITAL_COMMUNITY)
Admission: RE | Admit: 2017-03-04 | Discharge: 2017-03-04 | Disposition: A | Discharge status: Home / Self Care | Payer: Medicare Other | Source: Ambulatory Visit | Attending: Surgery | Admitting: Surgery

## 2017-03-04 VITALS — BP 163/80 | HR 81 | Temp 97.3°F | Resp 20 | Ht 72.0 in | Wt 261.0 lb

## 2017-03-04 DIAGNOSIS — I739 Peripheral vascular disease, unspecified: Secondary | ICD-10-CM

## 2017-03-04 NOTE — Progress Notes (Signed)
Patient name: Alexander [name] MRN: [id number] DOB: 1961/08/18 Sex: male    HPI:Alexander [name] a 56 y.o.malepatient of Dr. Scot Dock who had initially presented with extensive gangrene of his left foot. He underwent a fifth toe amputation which failed to heal. He had multilevel arterial occlusive disease. On 10/21/2013, he underwent a left common femoral artery to tibial peroneal trunk bypass with a vein graft and Ray amputation of his left fourth toe.  He ultimately required a transmetatarsal amputation on the left (Dr Berenice Primas).   He presented to the office with a new left 5th toe ulcer and dusky discoloration to his left foot.  ABI's on the right 0.34 on 10/08/2016.  Dr. Donzetta Matters performed and angiogram with intervention angioplasty of the right SFA and Popliteal arteries as well as Jetstream atherectomy of Right sfa and popliteal arteries.  His right 5th toe ulcer is completely healed.  He is still taking Eliquis and 81 mg aspirin daily.  He has stopped the Plavix per instructed by Dr. Donzetta Matters.  He manages his HTN with Lisinopril and Metoprolol, as well as takes Lipitor daily.      Past Medical History:  Diagnosis Date  . Alcohol abuse   . Anemia   . Arthritis   . Atrial fibrillation ([place])   . Cardiac syncope 10/07/14   rapid A fib with 8 sec pauses on converison with syncope- temp pacing wire placed then PPM  . Cataract    BILATERAL  . Chronic kidney disease   . Coronary artery disease   . Diabetes mellitus    dx---been  awhile  . Hyperlipemia   . Hypertension   . Peripheral vascular disease ([place])   . Presence of permanent cardiac pacemaker 10/08/2014   Past Surgical History:  Procedure Laterality Date  . AMPUTATION Left 08/19/2013   Procedure: AMPUTATION RAY;  Surgeon: Alta Corning, MD;  Location: [place];  Service: Orthopedics;  Laterality: Left;  ray amputation left 5th  . AMPUTATION Left 10/27/2013   Procedure: AMPUTATION DIGIT-LEFT 4TH TOE, 4th and 5th metatarsal.;  Surgeon:  Angelia Mould, MD;  Location: [place];  Service: Vascular;  Laterality: Left;  . AMPUTATION Left 11/04/2013   Procedure: LEFT FOOT TRANS-METATARSAL AMPUTATION WITH WOUND CLOSURE ;  Surgeon: Alta Corning, MD;  Location: [place];  Service: Orthopedics;  Laterality: Left;  . CARDIAC CATHETERIZATION  10/07/2014   Procedure: LEFT HEART CATH AND CORONARY ANGIOGRAPHY;  Surgeon: Laverda Page, MD;  Location: [facility] C[facility] LAB;  Service: Cardiovascular;;  . FEMORAL-TIBIAL BYPASS GRAFT Left 10/27/2013   Procedure: BYPASS GRAFT LEFT FEMORAL- POSTERIOR TIBIAL ARTERY;  Surgeon: Angelia Mould, MD;  Location: [place];  Service: Vascular;  Laterality: Left;  . I&D EXTREMITY Left 10/27/2013   Procedure: IRRIGATION AND DEBRIDEMENT EXTREMITY- LEFT FOOT;  Surgeon: Angelia Mould, MD;  Location: [place];  Service: Vascular;  Laterality: Left;  . LEFT HEART CATHETERIZATION WITH CORONARY ANGIOGRAM N/A 11/09/2014   Procedure: LEFT HEART CATHETERIZATION WITH CORONARY ANGIOGRAM;  Surgeon: Laverda Page, MD;  Location: [facility] CATH LAB;  Service: Cardiovascular;  Laterality: N/A;  . LOWER EXTREMITY ANGIOGRAM Right 11/08/2015   Procedure: Lower Extremity Angiogram;  Surgeon: Serafina Mitchell, MD;  Location: [place] CV LAB;  Service: Cardiovascular;  Laterality: Right;  . LUMBAR LAMINECTOMY  06/13/2012   Procedure: MICRODISCECTOMY LUMBAR LAMINECTOMY;  Surgeon: Jessy Oto, MD;  Location: [place];  Service: Orthopedics;  Laterality: N/A;  Central laminectomy L2-3, L3-4, L4-5  .  PACEMAKER INSERTION  10/08/14   MDT Advisa MRI compatible dual chamber pacemaker implanted by Dr Caryl Comes for syncope with post-termination pauses  . PERCUTANEOUS CORONARY STENT INTERVENTION (PCI-S)  11/09/2014   des to lad & distal circumflex         Dr  [name]  . PERIPHERAL VASCULAR CATHETERIZATION N/A 11/08/2015   Procedure: Abdominal Aortogram;  Surgeon: Serafina Mitchell, MD;  Location: [place] CV LAB;  Service: Cardiovascular;   Laterality: N/A;  . PERIPHERAL VASCULAR CATHETERIZATION Right 11/08/2015   Procedure: Peripheral Vascular Atherectomy;  Surgeon: Serafina Mitchell, MD;  Location: [place] CV LAB;  Service: Cardiovascular;  Laterality: Right;  . PERIPHERAL VASCULAR CATHETERIZATION N/A 10/10/2016   Procedure: Lower Extremity Angiography;  Surgeon: Waynetta Sandy, MD;  Location: [place] CV LAB;  Service: Cardiovascular;  Laterality: N/A;  . PERIPHERAL VASCULAR CATHETERIZATION Right 10/10/2016   Procedure: Peripheral Vascular Atherectomy;  Surgeon: Waynetta Sandy, MD;  Location: [place] CV LAB;  Service: Cardiovascular;  Laterality: Right;  Popliteal  . PERMANENT PACEMAKER INSERTION N/A 10/08/2014   Procedure: PERMANENT PACEMAKER INSERTION;  Surgeon: Deboraha Sprang, MD;  Location: [facility] CATH LAB;  Service: Cardiovascular;  Laterality: N/A;  . TEMPORARY PACEMAKER INSERTION Bilateral 10/07/2014   Procedure: TEMPORARY PACEMAKER INSERTION;  Surgeon: Laverda Page, MD;  Location: [facility] CATH LAB;  Service: Cardiovascular;  Laterality: Bilateral;    Family History  Problem Relation Age of Onset  . Diabetes type II Mother   . Hypertension Mother   . Diabetes Mother   . Liver cancer Father   . Diabetes type II Sister   . Breast cancer Sister   . Diabetes Sister   . Hypertension Sister   . Diabetes type II Brother   . Kidney failure Brother   . Diabetes Brother   . Hypertension Brother   . Diabetes type II Sister     SOCIAL HISTORY: Social History   Social History  . Marital status: Married    Spouse name: N/A  . Number of children: 2  . Years of education: N/A   Occupational History  . Disabled    Social History Main Topics  . Smoking status: Former Smoker    Packs/day: 1.50    Years: 30.00    Types: Cigars    Quit date: 10/07/2014  . Smokeless tobacco: Never Used  . Alcohol use 21.0 oz/week    35 Cans of beer per week     Comment: 6 of 12 oz beers a day  . Drug use:  No  . Sexual activity: Not on file   Other Topics Concern  . Not on file   Social History Narrative  . No narrative on file    Allergies  Allergen Reactions  . Sulfa Antibiotics Rash    Current Outpatient Prescriptions  Medication Sig Dispense Refill  . ACCU-CHEK FASTCLIX LANCETS MISC Check blood sugar TID & QHS 102 each 2  . amiodarone (PACERONE) 400 MG tablet Take 0.5 tablets (200 mg total) by mouth daily. 60 tablet 0  . apixaban (ELIQUIS) 5 MG TABS tablet Take 1 tablet (5 mg total) by mouth 2 (two) times daily. 180 tablet 3  . aspirin 81 MG chewable tablet Chew by mouth daily.    Marland Kitchen atorvastatin (LIPITOR) 20 MG tablet Take 20 tablets by mouth daily.  2  . blood glucose meter kit and supplies KIT Check blood sugar TID & QHS 1 each 0  . Blood Glucose Monitoring Suppl (ACCU-CHEK ADVANTAGE DIABETES)  kit Use as instructed 1 each 0  . chlorthalidone (HYGROTON) 50 MG tablet Take 50 mg by mouth daily.    Marland Kitchen glucose blood (ACCU-CHEK AVIVA PLUS) test strip Use as instructed 100 each 12  . glucose blood (CHOICE DM FORA G20 TEST STRIPS) test strip Check blood sugar TID & QHS 100 each 12  . glucose blood test strip Use as instructed 100 each 12  . hydrALAZINE (APRESOLINE) 100 MG tablet Take 100 mg by mouth 3 (three) times daily.    . Insulin Lispro Prot & Lispro (HUMALOG MIX 75/25 KWIKPEN) (75-25) 100 UNIT/ML Kwikpen Inject 46-56 Units into the skin 2 (two) times daily with a meal. Inject 56units subcutaneously before breakfast and 46units before supper    . lisinopril (PRINIVIL,ZESTRIL) 10 MG tablet Take 10 mg by mouth daily.   0  . metoprolol (LOPRESSOR) 100 MG tablet Take 100 mg by mouth 2 (two) times daily.    . Multiple Vitamin (MULTIVITAMIN WITH MINERALS) TABS tablet Take 1 tablet by mouth daily.    . prednisoLONE acetate (PRED FORTE) 1 % ophthalmic suspension Place 1 drop into the left eye 4 (four) times daily.  0   No current facility-administered medications for this visit.      ROS:   General:  No weight loss, Fever, chills  HEENT: No recent headaches, no nasal bleeding, right eye visual changes, no sore throat  Neurologic: No dizziness, blackouts, seizures. No recent symptoms of stroke or mini- stroke. No recent episodes of slurred speech, or temporary blindness.  Cardiac: No recent episodes of chest pain/pressure, no shortness of breath at rest.  No shortness of breath with exertion.  Denies history of atrial fibrillation or irregular heartbeat  Vascular: No history of rest pain in feet.  No history of claudication.  No history of non-healing ulcer, No history of DVT   Pulmonary: No home oxygen, no productive cough, no hemoptysis,  No asthma or wheezing  Musculoskeletal:  '[ ]'  Arthritis, '[ ]'  Low back pain,  '[ ]'  Joint pain  Hematologic:No history of hypercoagulable state.  No history of easy bleeding.  No history of anemia  Gastrointestinal: No hematochezia or melena,  No gastroesophageal reflux, no trouble swallowing  Urinary: '[ ]'  chronic Kidney disease, '[ ]'  on HD - '[ ]'  MWF or '[ ]'  TTHS, '[ ]'  Burning with urination, '[ ]'  Frequent urination, '[ ]'  Difficulty urinating;   Skin: No rashes  Psychological: No history of anxiety,  No history of depression   Physical Examination  Vitals:   03/04/17 1544  BP: (!) 163/80  Pulse: 81  Resp: 20  Temp: 97.3 F (36.3 C)  TempSrc: Oral  SpO2: 96%  Weight: 261 lb (118.4 kg)  Height: 6' (1.829 m)    Body mass index is 35.4 kg/m.  General:  Alert and oriented, no acute distress HEENT: Normal Neck: No bruit or JVD Pulmonary: Clear to auscultation bilaterally Cardiac: Regular Rate and Rhythm without murmur Abdomen: Soft, non-tender, non-distended, no mass, no scars Skin: No rash, no ulcers well healed left transmet amputation site.  Right foot kissing ulcer 4/5 th toes completely healed. Extremity Pulses:  2+ radial, brachial, femoral,  Right dorsalis pedis palpable.  Left foot warm to  touch Musculoskeletal: No deformity or edema  Neurologic: Upper and lower extremity motor 5/5 and symmetric  DATA:  ABI's Right 0.95 monophasic TBI 0.51 Left 0.98 Biphasic PT/ Triphasic DP Arterial Duplex Right Distal SFA villosity 318 cm/s prior to angioplasty 331 cm/s Left by  pass graft patent, no evidence of stenosis      ASSESSMENT:   B lower extremity PAD with ischemic changes S/P left fem-pop by pass with vein graft harvest well healed left transmetatarsal amputation S/P right lower extremity repeat angioplasty   PLAN:   The by pass is patent and the right LE wound has completely healed.  The patient will continue Eliquis and 81 mg aspirin daily.  He will check his skin daily for ulcers.  We will schedule him to return in 6 months for repeat ABI's and arterial duplex B.  If he has concerns or problems he will call sooner.   Theda Sers, EMMA [facility] PA-C [facility]  MD in clinic: Dr. Trula Slade

## 2017-03-05 NOTE — Addendum Note (Signed)
Addended by: Lianne Cure A on: 03/05/2017 11:19 AM   Modules accepted: Orders

## 2017-03-11 ENCOUNTER — Ambulatory Visit: Payer: Medicare Other

## 2017-03-12 DIAGNOSIS — E113552 Type 2 diabetes mellitus with stable proliferative diabetic retinopathy, left eye: Secondary | ICD-10-CM | POA: Diagnosis not present

## 2017-03-12 DIAGNOSIS — H35042 Retinal micro-aneurysms, unspecified, left eye: Secondary | ICD-10-CM | POA: Diagnosis not present

## 2017-03-12 DIAGNOSIS — H3561 Retinal hemorrhage, right eye: Secondary | ICD-10-CM | POA: Diagnosis not present

## 2017-03-12 DIAGNOSIS — H4311 Vitreous hemorrhage, right eye: Secondary | ICD-10-CM | POA: Diagnosis not present

## 2017-03-14 ENCOUNTER — Encounter (HOSPITAL_COMMUNITY)
Admission: RE | Admit: 2017-03-14 | Discharge: 2017-03-14 | Disposition: A | Discharge status: Home / Self Care | Payer: Medicare Other | Source: Ambulatory Visit | Attending: Nephrology | Admitting: Nephrology

## 2017-03-14 DIAGNOSIS — N183 Chronic kidney disease, stage 3 unspecified: Secondary | ICD-10-CM

## 2017-03-14 DIAGNOSIS — D631 Anemia in chronic kidney disease: Secondary | ICD-10-CM | POA: Insufficient documentation

## 2017-03-14 DIAGNOSIS — E1122 Type 2 diabetes mellitus with diabetic chronic kidney disease: Secondary | ICD-10-CM

## 2017-03-14 LAB — IRON AND TIBC
IRON: 68 ug/dL (ref 45–182)
Saturation Ratios: 26 % (ref 17.9–39.5)
TIBC: 258 ug/dL (ref 250–450)
UIBC: 190 ug/dL

## 2017-03-14 LAB — FERRITIN: FERRITIN: 136 ng/mL (ref 24–336)

## 2017-03-14 LAB — POCT HEMOGLOBIN-HEMACUE: Hemoglobin: 11.2 g/dL — ABNORMAL LOW (ref 13.0–17.0)

## 2017-03-14 MED ORDER — DARBEPOETIN ALFA 60 MCG/0.3ML IJ SOSY
PREFILLED_SYRINGE | INTRAMUSCULAR | Status: AC
  Filled 2017-03-14: qty 0.3

## 2017-03-14 MED ORDER — DARBEPOETIN ALFA 60 MCG/0.3ML IJ SOSY
60.0000 ug | PREFILLED_SYRINGE | INTRAMUSCULAR | Status: DC
  Administered 2017-03-14: 60 ug via SUBCUTANEOUS

## 2017-03-18 ENCOUNTER — Ambulatory Visit: Payer: Medicare Other

## 2017-03-18 ENCOUNTER — Encounter (HOSPITAL_COMMUNITY): Payer: Medicare Other

## 2017-03-19 DIAGNOSIS — E113591 Type 2 diabetes mellitus with proliferative diabetic retinopathy without macular edema, right eye: Secondary | ICD-10-CM | POA: Diagnosis not present

## 2017-03-19 DIAGNOSIS — H4311 Vitreous hemorrhage, right eye: Secondary | ICD-10-CM | POA: Diagnosis not present

## 2017-03-19 DIAGNOSIS — E113592 Type 2 diabetes mellitus with proliferative diabetic retinopathy without macular edema, left eye: Secondary | ICD-10-CM | POA: Diagnosis not present

## 2017-03-28 ENCOUNTER — Encounter (HOSPITAL_COMMUNITY)
Admission: RE | Admit: 2017-03-28 | Discharge: 2017-03-28 | Disposition: A | Discharge status: Home / Self Care | Payer: Medicare Other | Source: Ambulatory Visit | Attending: Nephrology | Admitting: Nephrology

## 2017-03-28 DIAGNOSIS — D631 Anemia in chronic kidney disease: Secondary | ICD-10-CM | POA: Diagnosis not present

## 2017-03-28 DIAGNOSIS — N183 Chronic kidney disease, stage 3 unspecified: Secondary | ICD-10-CM

## 2017-03-28 DIAGNOSIS — E1122 Type 2 diabetes mellitus with diabetic chronic kidney disease: Secondary | ICD-10-CM

## 2017-03-28 LAB — POCT HEMOGLOBIN-HEMACUE: Hemoglobin: 11.7 g/dL — ABNORMAL LOW (ref 13.0–17.0)

## 2017-03-28 MED ORDER — DARBEPOETIN ALFA 60 MCG/0.3ML IJ SOSY
PREFILLED_SYRINGE | INTRAMUSCULAR | Status: AC
  Filled 2017-03-28: qty 0.3

## 2017-03-28 MED ORDER — DARBEPOETIN ALFA 60 MCG/0.3ML IJ SOSY: 60.0000 ug | PREFILLED_SYRINGE | INTRAMUSCULAR | Status: DC

## 2017-04-01 DIAGNOSIS — E1165 Type 2 diabetes mellitus with hyperglycemia: Secondary | ICD-10-CM | POA: Diagnosis not present

## 2017-04-11 ENCOUNTER — Encounter (HOSPITAL_COMMUNITY)
Admission: RE | Admit: 2017-04-11 | Discharge: 2017-04-11 | Disposition: A | Discharge status: Home / Self Care | Payer: Medicare Other | Source: Ambulatory Visit | Attending: Nephrology | Admitting: Nephrology

## 2017-04-11 DIAGNOSIS — N183 Chronic kidney disease, stage 3 unspecified: Secondary | ICD-10-CM

## 2017-04-11 DIAGNOSIS — E1122 Type 2 diabetes mellitus with diabetic chronic kidney disease: Secondary | ICD-10-CM

## 2017-04-11 DIAGNOSIS — Z950 Presence of cardiac pacemaker: Secondary | ICD-10-CM | POA: Diagnosis not present

## 2017-04-11 DIAGNOSIS — D631 Anemia in chronic kidney disease: Secondary | ICD-10-CM | POA: Diagnosis not present

## 2017-04-11 LAB — POCT HEMOGLOBIN-HEMACUE: Hemoglobin: 11.4 g/dL — ABNORMAL LOW (ref 13.0–17.0)

## 2017-04-11 MED ORDER — DARBEPOETIN ALFA 60 MCG/0.3ML IJ SOSY
60.0000 ug | PREFILLED_SYRINGE | INTRAMUSCULAR | Status: DC
  Administered 2017-04-11: 60 ug via SUBCUTANEOUS

## 2017-04-11 MED ORDER — DARBEPOETIN ALFA 60 MCG/0.3ML IJ SOSY
PREFILLED_SYRINGE | INTRAMUSCULAR | Status: AC
  Filled 2017-04-11: qty 0.3

## 2017-04-17 DIAGNOSIS — Z6834 Body mass index (BMI) 34.0-34.9, adult: Secondary | ICD-10-CM | POA: Diagnosis not present

## 2017-04-17 DIAGNOSIS — B353 Tinea pedis: Secondary | ICD-10-CM | POA: Diagnosis not present

## 2017-04-25 ENCOUNTER — Encounter (HOSPITAL_COMMUNITY)
Admission: RE | Admit: 2017-04-25 | Discharge: 2017-04-25 | Disposition: A | Discharge status: Home / Self Care | Payer: Medicare Other | Source: Ambulatory Visit | Attending: Nephrology | Admitting: Nephrology

## 2017-04-25 DIAGNOSIS — D631 Anemia in chronic kidney disease: Secondary | ICD-10-CM | POA: Insufficient documentation

## 2017-04-25 DIAGNOSIS — N183 Chronic kidney disease, stage 3 unspecified: Secondary | ICD-10-CM

## 2017-04-25 DIAGNOSIS — E1122 Type 2 diabetes mellitus with diabetic chronic kidney disease: Secondary | ICD-10-CM

## 2017-04-25 LAB — POCT HEMOGLOBIN-HEMACUE: Hemoglobin: 11.2 g/dL — ABNORMAL LOW (ref 13.0–17.0)

## 2017-04-25 MED ORDER — DARBEPOETIN ALFA 60 MCG/0.3ML IJ SOSY
PREFILLED_SYRINGE | INTRAMUSCULAR | Status: AC
  Filled 2017-04-25: qty 0.3

## 2017-04-25 MED ORDER — DARBEPOETIN ALFA 60 MCG/0.3ML IJ SOSY
60.0000 ug | PREFILLED_SYRINGE | INTRAMUSCULAR | Status: DC
  Administered 2017-04-25: 60 ug via SUBCUTANEOUS

## 2017-05-09 ENCOUNTER — Encounter (HOSPITAL_COMMUNITY)
Admission: RE | Admit: 2017-05-09 | Discharge: 2017-05-09 | Disposition: A | Discharge status: Home / Self Care | Payer: Medicare Other | Source: Ambulatory Visit | Attending: Nephrology | Admitting: Nephrology

## 2017-05-09 DIAGNOSIS — D631 Anemia in chronic kidney disease: Secondary | ICD-10-CM

## 2017-05-09 DIAGNOSIS — N183 Chronic kidney disease, stage 3 unspecified: Secondary | ICD-10-CM

## 2017-05-09 DIAGNOSIS — E1122 Type 2 diabetes mellitus with diabetic chronic kidney disease: Secondary | ICD-10-CM

## 2017-05-09 LAB — IRON AND TIBC
IRON: 78 ug/dL (ref 45–182)
SATURATION RATIOS: 29 % (ref 17.9–39.5)
TIBC: 273 ug/dL (ref 250–450)
UIBC: 195 ug/dL

## 2017-05-09 LAB — FERRITIN: FERRITIN: 145 ng/mL (ref 24–336)

## 2017-05-09 LAB — POCT HEMOGLOBIN-HEMACUE: HEMOGLOBIN: 11.6 g/dL — AB (ref 13.0–17.0)

## 2017-05-09 MED ORDER — DARBEPOETIN ALFA 60 MCG/0.3ML IJ SOSY
PREFILLED_SYRINGE | INTRAMUSCULAR | Status: AC
  Administered 2017-05-09: 60 ug via SUBCUTANEOUS
  Filled 2017-05-09: qty 0.3

## 2017-05-09 MED ORDER — DARBEPOETIN ALFA 60 MCG/0.3ML IJ SOSY
60.0000 ug | PREFILLED_SYRINGE | INTRAMUSCULAR | Status: DC
  Administered 2017-05-09: 60 ug via SUBCUTANEOUS

## 2017-05-22 ENCOUNTER — Other Ambulatory Visit (HOSPITAL_COMMUNITY): Payer: Self-pay | Admitting: *Deleted

## 2017-05-23 ENCOUNTER — Encounter (HOSPITAL_COMMUNITY)
Admission: RE | Admit: 2017-05-23 | Discharge: 2017-05-23 | Disposition: A | Discharge status: Home / Self Care | Payer: Medicare Other | Source: Ambulatory Visit | Attending: Nephrology | Admitting: Nephrology

## 2017-05-23 DIAGNOSIS — N183 Chronic kidney disease, stage 3 unspecified: Secondary | ICD-10-CM

## 2017-05-23 DIAGNOSIS — E1122 Type 2 diabetes mellitus with diabetic chronic kidney disease: Secondary | ICD-10-CM

## 2017-05-23 DIAGNOSIS — D631 Anemia in chronic kidney disease: Secondary | ICD-10-CM | POA: Diagnosis not present

## 2017-05-23 LAB — POCT HEMOGLOBIN-HEMACUE: HEMOGLOBIN: 12.5 g/dL — AB (ref 13.0–17.0)

## 2017-05-23 MED ORDER — DARBEPOETIN ALFA 60 MCG/0.3ML IJ SOSY: 60.0000 ug | PREFILLED_SYRINGE | INTRAMUSCULAR | Status: DC

## 2017-05-28 DIAGNOSIS — Z950 Presence of cardiac pacemaker: Secondary | ICD-10-CM | POA: Diagnosis not present

## 2017-05-28 DIAGNOSIS — I7389 Other specified peripheral vascular diseases: Secondary | ICD-10-CM | POA: Diagnosis not present

## 2017-05-28 DIAGNOSIS — E1151 Type 2 diabetes mellitus with diabetic peripheral angiopathy without gangrene: Secondary | ICD-10-CM | POA: Diagnosis not present

## 2017-05-28 DIAGNOSIS — I10 Essential (primary) hypertension: Secondary | ICD-10-CM | POA: Diagnosis not present

## 2017-06-03 DIAGNOSIS — E113551 Type 2 diabetes mellitus with stable proliferative diabetic retinopathy, right eye: Secondary | ICD-10-CM | POA: Diagnosis not present

## 2017-06-03 DIAGNOSIS — E113522 Type 2 diabetes mellitus with proliferative diabetic retinopathy with traction retinal detachment involving the macula, left eye: Secondary | ICD-10-CM | POA: Diagnosis not present

## 2017-06-03 DIAGNOSIS — Z09 Encounter for follow-up examination after completed treatment for conditions other than malignant neoplasm: Secondary | ICD-10-CM | POA: Diagnosis not present

## 2017-06-05 ENCOUNTER — Other Ambulatory Visit (HOSPITAL_COMMUNITY): Payer: Self-pay | Admitting: *Deleted

## 2017-06-06 ENCOUNTER — Encounter (HOSPITAL_COMMUNITY)
Admission: RE | Admit: 2017-06-06 | Discharge: 2017-06-06 | Disposition: A | Discharge status: Home / Self Care | Payer: Medicare Other | Source: Ambulatory Visit | Attending: Nephrology | Admitting: Nephrology

## 2017-06-06 DIAGNOSIS — N183 Chronic kidney disease, stage 3 unspecified: Secondary | ICD-10-CM

## 2017-06-06 DIAGNOSIS — D631 Anemia in chronic kidney disease: Secondary | ICD-10-CM

## 2017-06-06 DIAGNOSIS — E1122 Type 2 diabetes mellitus with diabetic chronic kidney disease: Secondary | ICD-10-CM

## 2017-06-06 LAB — IRON AND TIBC
IRON: 80 ug/dL (ref 45–182)
Saturation Ratios: 33 % (ref 17.9–39.5)
TIBC: 242 ug/dL — AB (ref 250–450)
UIBC: 162 ug/dL

## 2017-06-06 LAB — FERRITIN: FERRITIN: 147 ng/mL (ref 24–336)

## 2017-06-06 LAB — POCT HEMOGLOBIN-HEMACUE: Hemoglobin: 11.2 g/dL — ABNORMAL LOW (ref 13.0–17.0)

## 2017-06-06 MED ORDER — DARBEPOETIN ALFA 60 MCG/0.3ML IJ SOSY
PREFILLED_SYRINGE | INTRAMUSCULAR | Status: AC
  Filled 2017-06-06: qty 0.3

## 2017-06-06 MED ORDER — DARBEPOETIN ALFA 60 MCG/0.3ML IJ SOSY
60.0000 ug | PREFILLED_SYRINGE | INTRAMUSCULAR | Status: DC
  Administered 2017-06-06: 60 ug via SUBCUTANEOUS

## 2017-06-20 ENCOUNTER — Encounter (HOSPITAL_COMMUNITY)
Admission: RE | Admit: 2017-06-20 | Discharge: 2017-06-20 | Disposition: A | Discharge status: Home / Self Care | Payer: Medicare Other | Source: Ambulatory Visit | Attending: Nephrology | Admitting: Nephrology

## 2017-06-20 DIAGNOSIS — D631 Anemia in chronic kidney disease: Secondary | ICD-10-CM | POA: Insufficient documentation

## 2017-06-20 DIAGNOSIS — E1122 Type 2 diabetes mellitus with diabetic chronic kidney disease: Secondary | ICD-10-CM

## 2017-06-20 DIAGNOSIS — N183 Chronic kidney disease, stage 3 unspecified: Secondary | ICD-10-CM

## 2017-06-20 LAB — POCT HEMOGLOBIN-HEMACUE: HEMOGLOBIN: 10.8 g/dL — AB (ref 13.0–17.0)

## 2017-06-20 MED ORDER — DARBEPOETIN ALFA 60 MCG/0.3ML IJ SOSY
60.0000 ug | PREFILLED_SYRINGE | INTRAMUSCULAR | Status: DC
  Administered 2017-06-20: 60 ug via SUBCUTANEOUS

## 2017-06-20 MED ORDER — DARBEPOETIN ALFA 60 MCG/0.3ML IJ SOSY
PREFILLED_SYRINGE | INTRAMUSCULAR | Status: AC
  Administered 2017-06-20: 60 ug via SUBCUTANEOUS
  Filled 2017-06-20: qty 0.3

## 2017-06-26 DIAGNOSIS — E1151 Type 2 diabetes mellitus with diabetic peripheral angiopathy without gangrene: Secondary | ICD-10-CM | POA: Diagnosis not present

## 2017-06-26 DIAGNOSIS — N183 Chronic kidney disease, stage 3 (moderate): Secondary | ICD-10-CM | POA: Diagnosis not present

## 2017-06-26 DIAGNOSIS — I10 Essential (primary) hypertension: Secondary | ICD-10-CM | POA: Diagnosis not present

## 2017-06-26 DIAGNOSIS — Z7901 Long term (current) use of anticoagulants: Secondary | ICD-10-CM | POA: Diagnosis not present

## 2017-07-04 ENCOUNTER — Encounter (HOSPITAL_COMMUNITY)
Admission: RE | Admit: 2017-07-04 | Discharge: 2017-07-04 | Disposition: A | Discharge status: Home / Self Care | Payer: Medicare Other | Source: Ambulatory Visit | Attending: Nephrology | Admitting: Nephrology

## 2017-07-04 DIAGNOSIS — N183 Chronic kidney disease, stage 3 unspecified: Secondary | ICD-10-CM

## 2017-07-04 DIAGNOSIS — D631 Anemia in chronic kidney disease: Secondary | ICD-10-CM | POA: Diagnosis not present

## 2017-07-04 DIAGNOSIS — E1122 Type 2 diabetes mellitus with diabetic chronic kidney disease: Secondary | ICD-10-CM

## 2017-07-04 LAB — IRON AND TIBC
Iron: 86 ug/dL (ref 45–182)
SATURATION RATIOS: 36 % (ref 17.9–39.5)
TIBC: 238 ug/dL — ABNORMAL LOW (ref 250–450)
UIBC: 152 ug/dL

## 2017-07-04 LAB — FERRITIN: Ferritin: 119 ng/mL (ref 24–336)

## 2017-07-04 LAB — POCT HEMOGLOBIN-HEMACUE: Hemoglobin: 11.1 g/dL — ABNORMAL LOW (ref 13.0–17.0)

## 2017-07-04 MED ORDER — DARBEPOETIN ALFA 60 MCG/0.3ML IJ SOSY
60.0000 ug | PREFILLED_SYRINGE | INTRAMUSCULAR | Status: DC
  Administered 2017-07-04: 60 ug via SUBCUTANEOUS

## 2017-07-04 MED ORDER — DARBEPOETIN ALFA 60 MCG/0.3ML IJ SOSY
PREFILLED_SYRINGE | INTRAMUSCULAR | Status: AC
  Filled 2017-07-04: qty 0.3

## 2017-07-18 ENCOUNTER — Encounter (HOSPITAL_COMMUNITY)
Admission: RE | Admit: 2017-07-18 | Discharge: 2017-07-18 | Disposition: A | Discharge status: Home / Self Care | Payer: Medicare Other | Source: Ambulatory Visit | Attending: Nephrology | Admitting: Nephrology

## 2017-07-18 DIAGNOSIS — N183 Chronic kidney disease, stage 3 unspecified: Secondary | ICD-10-CM

## 2017-07-18 DIAGNOSIS — D631 Anemia in chronic kidney disease: Secondary | ICD-10-CM | POA: Insufficient documentation

## 2017-07-18 DIAGNOSIS — E1122 Type 2 diabetes mellitus with diabetic chronic kidney disease: Secondary | ICD-10-CM

## 2017-07-18 LAB — POCT HEMOGLOBIN-HEMACUE: Hemoglobin: 11.5 g/dL — ABNORMAL LOW (ref 13.0–17.0)

## 2017-07-18 MED ORDER — DARBEPOETIN ALFA 60 MCG/0.3ML IJ SOSY
PREFILLED_SYRINGE | INTRAMUSCULAR | Status: AC
  Filled 2017-07-18: qty 0.3

## 2017-07-18 MED ORDER — DARBEPOETIN ALFA 60 MCG/0.3ML IJ SOSY
60.0000 ug | PREFILLED_SYRINGE | INTRAMUSCULAR | Status: DC
  Administered 2017-07-18: 60 ug via SUBCUTANEOUS

## 2017-07-25 ENCOUNTER — Encounter: Payer: Self-pay | Admitting: Internal Medicine

## 2017-08-01 ENCOUNTER — Encounter (HOSPITAL_COMMUNITY)
Admission: RE | Admit: 2017-08-01 | Discharge: 2017-08-01 | Disposition: A | Discharge status: Home / Self Care | Payer: Medicare Other | Source: Ambulatory Visit | Attending: Nephrology | Admitting: Nephrology

## 2017-08-01 DIAGNOSIS — D631 Anemia in chronic kidney disease: Secondary | ICD-10-CM | POA: Diagnosis not present

## 2017-08-01 DIAGNOSIS — N183 Chronic kidney disease, stage 3 unspecified: Secondary | ICD-10-CM

## 2017-08-01 DIAGNOSIS — E1122 Type 2 diabetes mellitus with diabetic chronic kidney disease: Secondary | ICD-10-CM

## 2017-08-01 LAB — POCT HEMOGLOBIN-HEMACUE: Hemoglobin: 11.5 g/dL — ABNORMAL LOW (ref 13.0–17.0)

## 2017-08-01 LAB — IRON AND TIBC
IRON: 85 ug/dL (ref 45–182)
SATURATION RATIOS: 34 % (ref 17.9–39.5)
TIBC: 251 ug/dL (ref 250–450)
UIBC: 166 ug/dL

## 2017-08-01 LAB — FERRITIN: Ferritin: 137 ng/mL (ref 24–336)

## 2017-08-01 MED ORDER — DARBEPOETIN ALFA 60 MCG/0.3ML IJ SOSY
60.0000 ug | PREFILLED_SYRINGE | INTRAMUSCULAR | Status: DC
  Administered 2017-08-01: 60 ug via SUBCUTANEOUS

## 2017-08-01 MED ORDER — DARBEPOETIN ALFA 60 MCG/0.3ML IJ SOSY
PREFILLED_SYRINGE | INTRAMUSCULAR | Status: AC
  Administered 2017-08-01: 60 ug via SUBCUTANEOUS
  Filled 2017-08-01: qty 0.3

## 2017-08-08 ENCOUNTER — Emergency Department (HOSPITAL_COMMUNITY)
Admission: EM | Admit: 2017-08-08 | Discharge: 2017-08-09 | Disposition: A | Discharge status: Home / Self Care | Payer: Medicare Other | Attending: Physician Assistant | Admitting: Physician Assistant

## 2017-08-08 ENCOUNTER — Emergency Department (HOSPITAL_COMMUNITY): Payer: Medicare Other

## 2017-08-08 ENCOUNTER — Telehealth: Payer: Self-pay

## 2017-08-08 ENCOUNTER — Encounter (HOSPITAL_COMMUNITY): Payer: Self-pay

## 2017-08-08 DIAGNOSIS — Z7902 Long term (current) use of antithrombotics/antiplatelets: Secondary | ICD-10-CM | POA: Insufficient documentation

## 2017-08-08 DIAGNOSIS — Z79899 Other long term (current) drug therapy: Secondary | ICD-10-CM | POA: Diagnosis not present

## 2017-08-08 DIAGNOSIS — M79604 Pain in right leg: Secondary | ICD-10-CM | POA: Diagnosis not present

## 2017-08-08 DIAGNOSIS — I129 Hypertensive chronic kidney disease with stage 1 through stage 4 chronic kidney disease, or unspecified chronic kidney disease: Secondary | ICD-10-CM | POA: Diagnosis not present

## 2017-08-08 DIAGNOSIS — Z7901 Long term (current) use of anticoagulants: Secondary | ICD-10-CM | POA: Insufficient documentation

## 2017-08-08 DIAGNOSIS — E1122 Type 2 diabetes mellitus with diabetic chronic kidney disease: Secondary | ICD-10-CM | POA: Insufficient documentation

## 2017-08-08 DIAGNOSIS — Z794 Long term (current) use of insulin: Secondary | ICD-10-CM | POA: Diagnosis not present

## 2017-08-08 DIAGNOSIS — Z950 Presence of cardiac pacemaker: Secondary | ICD-10-CM | POA: Insufficient documentation

## 2017-08-08 DIAGNOSIS — I708 Atherosclerosis of other arteries: Secondary | ICD-10-CM | POA: Diagnosis not present

## 2017-08-08 DIAGNOSIS — Z9862 Peripheral vascular angioplasty status: Secondary | ICD-10-CM | POA: Diagnosis not present

## 2017-08-08 DIAGNOSIS — I2510 Atherosclerotic heart disease of native coronary artery without angina pectoris: Secondary | ICD-10-CM | POA: Insufficient documentation

## 2017-08-08 DIAGNOSIS — Z955 Presence of coronary angioplasty implant and graft: Secondary | ICD-10-CM | POA: Insufficient documentation

## 2017-08-08 DIAGNOSIS — N189 Chronic kidney disease, unspecified: Secondary | ICD-10-CM | POA: Insufficient documentation

## 2017-08-08 DIAGNOSIS — Z87891 Personal history of nicotine dependence: Secondary | ICD-10-CM | POA: Diagnosis not present

## 2017-08-08 DIAGNOSIS — I739 Peripheral vascular disease, unspecified: Secondary | ICD-10-CM | POA: Diagnosis not present

## 2017-08-08 LAB — PROTIME-INR
INR: 1.2
PROTHROMBIN TIME: 15.1 s (ref 11.4–15.2)

## 2017-08-08 LAB — COMPREHENSIVE METABOLIC PANEL
ALT: 25 U/L (ref 17–63)
AST: 22 U/L (ref 15–41)
Albumin: 3.9 g/dL (ref 3.5–5.0)
Alkaline Phosphatase: 85 U/L (ref 38–126)
Anion gap: 9 (ref 5–15)
BUN: 35 mg/dL — ABNORMAL HIGH (ref 6–20)
CHLORIDE: 104 mmol/L (ref 101–111)
CO2: 23 mmol/L (ref 22–32)
Calcium: 9.2 mg/dL (ref 8.9–10.3)
Creatinine, Ser: 2.16 mg/dL — ABNORMAL HIGH (ref 0.61–1.24)
GFR, EST AFRICAN AMERICAN: 37 mL/min — AB (ref >60)
GFR, EST NON AFRICAN AMERICAN: 32 mL/min — AB (ref >60)
Glucose, Bld: 133 mg/dL — ABNORMAL HIGH (ref 65–99)
POTASSIUM: 4.5 mmol/L (ref 3.5–5.1)
Sodium: 136 mmol/L (ref 135–145)
Total Bilirubin: 0.8 mg/dL (ref 0.3–1.2)
Total Protein: 6.9 g/dL (ref 6.5–8.1)

## 2017-08-08 LAB — CBC WITH DIFFERENTIAL/PLATELET
BASOS ABS: 0.1 10*3/uL (ref 0.0–0.1)
Basophils Relative: 1 %
EOS PCT: 2 %
Eosinophils Absolute: 0.2 10*3/uL (ref 0.0–0.7)
HCT: 34.1 % — ABNORMAL LOW (ref 39.0–52.0)
Hemoglobin: 11.6 g/dL — ABNORMAL LOW (ref 13.0–17.0)
LYMPHS PCT: 18 %
Lymphs Abs: 1.2 10*3/uL (ref 0.7–4.0)
MCH: 30.4 pg (ref 26.0–34.0)
MCHC: 34 g/dL (ref 30.0–36.0)
MCV: 89.5 fL (ref 78.0–100.0)
MONO ABS: 0.5 10*3/uL (ref 0.1–1.0)
Monocytes Relative: 7 %
Neutro Abs: 4.9 10*3/uL (ref 1.7–7.7)
Neutrophils Relative %: 72 %
PLATELETS: 227 10*3/uL (ref 150–400)
RBC: 3.81 MIL/uL — ABNORMAL LOW (ref 4.22–5.81)
RDW: 13.5 % (ref 11.5–15.5)
WBC: 6.8 10*3/uL (ref 4.0–10.5)

## 2017-08-08 LAB — CBG MONITORING, ED: Glucose-Capillary: 175 mg/dL — ABNORMAL HIGH (ref 65–99)

## 2017-08-08 MED ORDER — IOPAMIDOL (ISOVUE-370) INJECTION 76%
INTRAVENOUS | Status: AC
  Administered 2017-08-08: 100 mL
  Filled 2017-08-08: qty 100

## 2017-08-08 NOTE — ED Provider Notes (Signed)
[facility] DEPT Provider Note   CSN: [id number] Arrival date & time: 08/08/17  1511     History   Chief Complaint Chief Complaint  Patient presents with  . Leg Pain    HPI RYON LAYTON is a 56 y.o. male.  HPI  Patient is a 56 yo male presenting with RLE pain.  Paitent has had two stents in the RLE  bypass in the LLE.  R popliteal angioplasty 2016 and R fem bipap 2014 Followed by Burnadette Pop.  Patient reports worsening right lower extremity pain, tingling. The pain is cramping in his right calf with pain in his right sole.  Patient says it's mildly painful. Becoming is made worse by exertion. Patient states claudication symptoms have been getting worse in the last 2 days have gotten very hard to manage at home.  Patient on Plavix, Eliquis at home.   Past Medical History:  Diagnosis Date  . Alcohol abuse   . Anemia   . Arthritis   . Atrial fibrillation ([place])   . Cardiac syncope 10/07/14   rapid A fib with 8 sec pauses on converison with syncope- temp pacing wire placed then PPM  . Cataract    BILATERAL  . Chronic kidney disease   . Coronary artery disease   . Diabetes mellitus    dx---been  awhile  . Hyperlipemia   . Hypertension   . Peripheral vascular disease ([place])   . Presence of permanent cardiac pacemaker 10/08/2014    Patient Active Problem List   Diagnosis Date Noted  . Essential hypertension, benign 02/07/2015  . Renal insufficiency 02/07/2015  . Post PTCA 11/09/2014  . S/P PTCA (percutaneous transluminal coronary angioplasty) 11/09/2014  . Cardiac asystole ([place]) 10/07/2014  . Cardiac syncope 10/07/2014  . Diabetes mellitus with stage 3 chronic kidney disease ([place]) 10/07/2014  . CAD (coronary artery disease), native coronary artery 10/07/2014  . Diabetes mellitus due to underlying condition without complications ([place]) 37/48/2707  . Anemia, unspecified 05/25/2014  . Syncope 04/30/2014  . Atherosclerosis of native arteries of the extremities  with gangrene ([place]) 12/16/2013  . Volume overload 10/30/2013  . Acute renal failure ([place]) 10/28/2013  . PAD (peripheral artery disease) ([place]) 10/27/2013  . Leukocytosis 10/21/2013  . Anemia 10/21/2013  . Osteomyelitis of left foot ([place]) 08/19/2013  . Spinal stenosis, lumbar region, with neurogenic claudication 06/12/2012    Class: Diagnosis of  . DIABETES MELLITUS, TYPE II 03/15/2009  . ALCOHOL ABUSE 03/15/2009  . TOBACCO USE 03/15/2009  . HYPERTENSION 03/15/2009    Past Surgical History:  Procedure Laterality Date  . AMPUTATION Left 08/19/2013   Procedure: AMPUTATION RAY;  Surgeon: Alta Corning, MD;  Location: [place];  Service: Orthopedics;  Laterality: Left;  ray amputation left 5th  . AMPUTATION Left 10/27/2013   Procedure: AMPUTATION DIGIT-LEFT 4TH TOE, 4th and 5th metatarsal.;  Surgeon: Angelia Mould, MD;  Location: [place];  Service: Vascular;  Laterality: Left;  . AMPUTATION Left 11/04/2013   Procedure: LEFT FOOT TRANS-METATARSAL AMPUTATION WITH WOUND CLOSURE ;  Surgeon: Alta Corning, MD;  Location: [place];  Service: Orthopedics;  Laterality: Left;  . CARDIAC CATHETERIZATION  10/07/2014   Procedure: LEFT HEART CATH AND CORONARY ANGIOGRAPHY;  Surgeon: Laverda Page, MD;  Location: [facility] CATH LAB;  Service: Cardiovascular;;  . FEMORAL-TIBIAL BYPASS GRAFT Left 10/27/2013   Procedure: BYPASS GRAFT LEFT FEMORAL- POSTERIOR TIBIAL ARTERY;  Surgeon: Angelia Mould, MD;  Location: [place];  Service: Vascular;  Laterality: Left;  .  I&D EXTREMITY Left 10/27/2013   Procedure: IRRIGATION AND DEBRIDEMENT EXTREMITY- LEFT FOOT;  Surgeon: Angelia Mould, MD;  Location: [place];  Service: Vascular;  Laterality: Left;  . LEFT HEART CATHETERIZATION WITH CORONARY ANGIOGRAM N/A 11/09/2014   Procedure: LEFT HEART CATHETERIZATION WITH CORONARY ANGIOGRAM;  Surgeon: Laverda Page, MD;  Location: [facility] CATH LAB;  Service: Cardiovascular;  Laterality: N/A;  . LOWER EXTREMITY ANGIOGRAM  Right 11/08/2015   Procedure: Lower Extremity Angiogram;  Surgeon: Serafina Mitchell, MD;  Location: [place] CV LAB;  Service: Cardiovascular;  Laterality: Right;  . LUMBAR LAMINECTOMY  06/13/2012   Procedure: MICRODISCECTOMY LUMBAR LAMINECTOMY;  Surgeon: Jessy Oto, MD;  Location: [place];  Service: Orthopedics;  Laterality: N/A;  Central laminectomy L2-3, L3-4, L4-5  . PACEMAKER INSERTION  10/08/14   MDT Advisa MRI compatible dual chamber pacemaker implanted by Dr Caryl Comes for syncope with post-termination pauses  . PERCUTANEOUS CORONARY STENT INTERVENTION (PCI-S)  11/09/2014   des to lad & distal circumflex         Dr  [name]  . PERIPHERAL VASCULAR CATHETERIZATION N/A 11/08/2015   Procedure: Abdominal Aortogram;  Surgeon: Serafina Mitchell, MD;  Location: [place] CV LAB;  Service: Cardiovascular;  Laterality: N/A;  . PERIPHERAL VASCULAR CATHETERIZATION Right 11/08/2015   Procedure: Peripheral Vascular Atherectomy;  Surgeon: Serafina Mitchell, MD;  Location: [place] CV LAB;  Service: Cardiovascular;  Laterality: Right;  . PERIPHERAL VASCULAR CATHETERIZATION N/A 10/10/2016   Procedure: Lower Extremity Angiography;  Surgeon: Waynetta Sandy, MD;  Location: [place] CV LAB;  Service: Cardiovascular;  Laterality: N/A;  . PERIPHERAL VASCULAR CATHETERIZATION Right 10/10/2016   Procedure: Peripheral Vascular Atherectomy;  Surgeon: Waynetta Sandy, MD;  Location: [place] CV LAB;  Service: Cardiovascular;  Laterality: Right;  Popliteal  . PERMANENT PACEMAKER INSERTION N/A 10/08/2014   Procedure: PERMANENT PACEMAKER INSERTION;  Surgeon: Deboraha Sprang, MD;  Location: [facility] CATH LAB;  Service: Cardiovascular;  Laterality: N/A;  . TEMPORARY PACEMAKER INSERTION Bilateral 10/07/2014   Procedure: TEMPORARY PACEMAKER INSERTION;  Surgeon: Laverda Page, MD;  Location: [facility] CATH LAB;  Service: Cardiovascular;  Laterality: Bilateral;       Home Medications    Prior to Admission  medications   Medication Sig Start Date End Date Taking? Authorizing Provider  ACCU-CHEK FASTCLIX LANCETS MISC Check blood sugar TID & QHS 10/01/14   Lorayne Marek, MD  amiodarone (PACERONE) 400 MG tablet Take 0.5 tablets (200 mg total) by mouth daily. 11/10/14   Adrian Prows, MD  apixaban (ELIQUIS) 5 MG TABS tablet Take 1 tablet (5 mg total) by mouth 2 (two) times daily. 01/14/15   Lorayne Marek, MD  aspirin 81 MG chewable tablet Chew by mouth daily.    [provider]  atorvastatin (LIPITOR) 20 MG tablet Take 20 tablets by mouth daily. 03/01/16   [provider]  blood glucose meter kit and supplies KIT Check blood sugar TID & QHS 02/10/15   Advani, Vernon Prey, MD  Blood Glucose Monitoring Suppl (ACCU-CHEK ADVANTAGE DIABETES) kit Use as instructed 10/01/14   Lorayne Marek, MD  chlorthalidone (HYGROTON) 50 MG tablet Take 50 mg by mouth daily.    [provider]  glucose blood (ACCU-CHEK AVIVA PLUS) test strip Use as instructed 10/01/14   Advani, Vernon Prey, MD  glucose blood (CHOICE DM FORA G20 TEST STRIPS) test strip Check blood sugar TID & QHS 02/10/15   Advani, Vernon Prey, MD  glucose blood test strip Use as instructed 11/20/13   Abrol,  Ascencion Dike, MD  hydrALAZINE (APRESOLINE) 100 MG tablet Take 100 mg by mouth 3 (three) times daily.    [provider]  Insulin Lispro Prot & Lispro (HUMALOG MIX 75/25 KWIKPEN) (75-25) 100 UNIT/ML Kwikpen Inject 46-56 Units into the skin 2 (two) times daily with a meal. Inject 56units subcutaneously before breakfast and 46units before supper    [provider]  Insulin NPH Isophane & Regular (NOVOLIN 70/30 [place]) Inject 56 Units/day into the skin every morning. 46 units at night    [provider]  lisinopril (PRINIVIL,ZESTRIL) 10 MG tablet Take 10 mg by mouth daily.  10/02/16   [provider]  metoprolol (LOPRESSOR) 100 MG tablet Take 100 mg by mouth 2 (two) times daily.    [provider]  Multiple Vitamin  (MULTIVITAMIN WITH MINERALS) TABS tablet Take 1 tablet by mouth daily.    [provider]  prednisoLONE acetate (PRED FORTE) 1 % ophthalmic suspension Place 1 drop into the left eye 4 (four) times daily. 09/28/16   [provider]    Family History Family History  Problem Relation Age of Onset  . Diabetes type II Mother   . Hypertension Mother   . Diabetes Mother   . Liver cancer Father   . Diabetes type II Sister   . Breast cancer Sister   . Diabetes Sister   . Hypertension Sister   . Diabetes type II Brother   . Kidney failure Brother   . Diabetes Brother   . Hypertension Brother   . Diabetes type II Sister     Social History Social History  Substance Use Topics  . Smoking status: Former Smoker    Packs/day: 1.50    Years: 30.00    Types: Cigars    Quit date: 10/07/2014  . Smokeless tobacco: Never Used  . Alcohol use 21.0 oz/week    35 Cans of beer per week     Comment: 6 of 12 oz beers a day     Allergies   Sulfa antibiotics   Review of Systems Review of Systems  Constitutional: Negative for activity change.  Respiratory: Negative for shortness of breath.   Cardiovascular: Negative for chest pain.  Gastrointestinal: Negative for abdominal pain.     Physical Exam Updated Vital Signs BP (!) 115/54 (BP Location: Right Arm)   Pulse 62   Temp 98.8 F (37.1 C) (Oral)   Resp 20   Ht 6' (1.829 m)   Wt 117.9 kg (260 lb)   SpO2 95%   BMI 35.26 kg/m   Physical Exam  Constitutional: He appears well-developed and well-nourished.  HENT:  Head: Normocephalic and atraumatic.  Eyes: Conjunctivae are normal.  Neck: Neck supple.  Cardiovascular: Normal rate and regular rhythm.   No murmur heard. Pulmonary/Chest: Effort normal and breath sounds normal. No respiratory distress.  Abdominal: Soft. He exhibits no distension. There is no tenderness.  Musculoskeletal: He exhibits no edema.  R leg still well pefused, + dopplerable pulses to DP and  PD. L leg with scar  Neurological: He is alert.  Skin: Skin is warm and dry.  Psychiatric: He has a normal mood and affect.  Nursing note and vitals reviewed.    ED Treatments / Results  Labs (all labs ordered are listed, but only abnormal results are displayed) Labs Reviewed  CBC WITH DIFFERENTIAL/PLATELET - Abnormal; Notable for the following:       Result Value   RBC 3.81 (*)    Hemoglobin 11.6 (*)  HCT 34.1 (*)    All other components within normal limits  COMPREHENSIVE METABOLIC PANEL - Abnormal; Notable for the following:    Glucose, Bld 133 (*)    BUN 35 (*)    Creatinine, Ser 2.16 (*)    GFR calc non Af Amer 32 (*)    GFR calc Af Amer 37 (*)    All other components within normal limits  PROTIME-INR    EKG  EKG Interpretation None       Radiology No results found.  Procedures Procedures (including critical care time)  Medications Ordered in ED Medications - No data to display   Initial Impression / Assessment and Plan / ED Course  I have reviewed the triage vital signs and the nursing notes.  Pertinent labs & imaging results that were available during my care of the patient were reviewed by me and considered in my medical decision making (see chart for details).    Patient is a 56 yo male presenting with RLE pain.  Paitent has had two stents in the RLE  bypass in the LLE.  R popliteal angioplasty 2016 and R fem bipap 2014 Followed by Burnadette Pop.  Patient reports worsening right lower extremity pain, tingling. The pain is cramping in his right calf with pain in his right sole.  Patient says it's mildly painful. Becoming is made worse by exertion. Patient states claudication symptoms have been getting worse in the last 2 days have gotten very hard to manage at home.  Patient on Plavix, Eliquis at home.  12:11 AM CT angiogram with runoff shows no acute occlusion. This is likely worsening claudication. Discussed with vascular surgeon will have  patient follow up with vascular surgeon on tomorrow or Monday.   Final Clinical Impressions(s) / ED Diagnoses   Final diagnoses:  None    New Prescriptions New Prescriptions   No medications on file     Macarthur Critchley, MD 08/09/17 0013

## 2017-08-08 NOTE — ED Triage Notes (Signed)
Pt presents with 2 week h/o RLE pain.  Pt describes as a cramping to calf; reports stent placed to RLE x 6  Months ago.  Denies long distance trips.

## 2017-08-08 NOTE — Telephone Encounter (Signed)
Returned call to pt's wife - c/o R foot numbness (plantar) and R calf tightness that feels as if it's going to cramp. States R foot is cold to the touch  Intermittent pain 5-8/10 when walking, no rest pain, both legs feel normally warm, no ulcers. Denies fever/chills States this has been going on for 2-3 days. S//w [name], [place] in office today and she suggested the pt go to the ER for evaluation due to his history. Called pt's wife back and advised her to take Alexander [name] to the ER ASAP. She stated he's in the shower and when he's done they would be on there way to [facility] ED. Wife verbalized understanding and agreed with the above plan.

## 2017-08-09 ENCOUNTER — Encounter: Payer: Self-pay | Admitting: Surgery

## 2017-08-09 NOTE — ED Notes (Signed)
Pt verbalized understanding of d.c instructions, no further questions

## 2017-08-09 NOTE — Discharge Instructions (Signed)
Please follow up with your vascular surgeon soon as possible for worsening claudication symtpoms.

## 2017-08-12 ENCOUNTER — Ambulatory Visit: Payer: Medicare Other | Admitting: Family

## 2017-08-12 ENCOUNTER — Encounter: Payer: Self-pay | Admitting: Surgery

## 2017-08-12 ENCOUNTER — Ambulatory Visit (INDEPENDENT_AMBULATORY_CARE_PROVIDER_SITE_OTHER): Payer: Medicare Other | Admitting: Surgery

## 2017-08-12 VITALS — BP 144/75 | HR 65 | Temp 97.9°F | Resp 18 | Ht 72.0 in | Wt 264.7 lb

## 2017-08-12 DIAGNOSIS — I70211 Atherosclerosis of native arteries of extremities with intermittent claudication, right leg: Secondary | ICD-10-CM | POA: Diagnosis not present

## 2017-08-12 MED ORDER — CILOSTAZOL 100 MG PO TABS: 100.0000 mg | ORAL_TABLET | Freq: Two times a day (BID) | ORAL | 11 refills | Status: DC

## 2017-08-12 NOTE — Progress Notes (Signed)
Vascular and Vein Specialist of [facility]  Patient name: Alexander [name] MRN: [id number] DOB: Mar 25, 1961 Sex: male   REASON FOR VISIT:    Right leg Pain  HISOTRY OF PRESENT ILLNESS:    Alexander [name] is a 56 y.o. male who is back for follow-up.  He is status post left femoral to tibioperoneal trunk bypass on 10/21/2013 by Dr. Scot Dock.  This was done for a nonhealing wound which ultimately required a transmetatarsal amputation performed by Dr. Berenice Primas.  The patient also had a wound on his right leg and on 11/08/2015 I performed atherectomy with drug coated balloon angioplasty for a tandem above-the-knee popliteal stenosis.  On 10/10/2016 he presented with a high-grade stenosis in his right popliteal artery as well as rest pain and Dr. Donzetta Matters performed drug coated balloon and plasty as well as atherectomy of the right superficial femoral and popliteal artery.  The patient had been doing well without lifestyle limiting symptoms of claudication and no ulcers.  Approximate 3 weeks ago he began experiencing worsening pain in his right leg especially with walking.  He describes his leg is feeling tight and if he is walking on a rock on his heel.  He does get cramping with short distance walking.  He feels his leg is cold to the touch.  He went to the emergency department.  A CT angiogram of the right leg was performed which showed popliteal artery occlusion.  The patient does have a pacemaker in place.  He is on Eliquis.  He takes a statin for hypercholesterolemia.  He is a former smoker.   PAST MEDICAL HISTORY:   Past Medical History:  Diagnosis Date  . Alcohol abuse   . Anemia   . Arthritis   . Atrial fibrillation ([place])   . Cardiac syncope 10/07/14   rapid A fib with 8 sec pauses on converison with syncope- temp pacing wire placed then PPM  . Cataract    BILATERAL  . Chronic kidney disease   . Coronary artery disease   . Diabetes mellitus    dx---been   awhile  . Hyperlipemia   . Hypertension   . Peripheral vascular disease ([place])   . Presence of permanent cardiac pacemaker 10/08/2014     FAMILY HISTORY:   Family History  Problem Relation Age of Onset  . Diabetes type II Mother   . Hypertension Mother   . Diabetes Mother   . Liver cancer Father   . Diabetes type II Sister   . Breast cancer Sister   . Diabetes Sister   . Hypertension Sister   . Diabetes type II Brother   . Kidney failure Brother   . Diabetes Brother   . Hypertension Brother   . Diabetes type II Sister     SOCIAL HISTORY:   Social History  Substance Use Topics  . Smoking status: Former Smoker    Packs/day: 1.50    Years: 30.00    Types: Cigars    Quit date: 10/07/2014  . Smokeless tobacco: Never Used  . Alcohol use 21.0 oz/week    35 Cans of beer per week     Comment: 6 of 12 oz beers a day     ALLERGIES:   Allergies  Allergen Reactions  . Sulfa Antibiotics Rash     CURRENT MEDICATIONS:   Current Outpatient Prescriptions  Medication Sig Dispense Refill  . ACCU-CHEK FASTCLIX LANCETS MISC Check blood sugar TID & QHS 102 each 2  . amiodarone (PACERONE) 400 MG  tablet Take 0.5 tablets (200 mg total) by mouth daily. 60 tablet 0  . apixaban (ELIQUIS) 5 MG TABS tablet Take 1 tablet (5 mg total) by mouth 2 (two) times daily. 180 tablet 3  . aspirin 81 MG chewable tablet Chew by mouth daily.    Marland Kitchen atorvastatin (LIPITOR) 20 MG tablet Take 20 tablets by mouth daily.  2  . blood glucose meter kit and supplies KIT Check blood sugar TID & QHS 1 each 0  . Blood Glucose Monitoring Suppl (ACCU-CHEK ADVANTAGE DIABETES) kit Use as instructed 1 each 0  . chlorthalidone (HYGROTON) 50 MG tablet Take 50 mg by mouth daily.    Marland Kitchen glucose blood (ACCU-CHEK AVIVA PLUS) test strip Use as instructed 100 each 12  . glucose blood (CHOICE DM FORA G20 TEST STRIPS) test strip Check blood sugar TID & QHS 100 each 12  . glucose blood test strip Use as instructed 100 each 12    . hydrALAZINE (APRESOLINE) 100 MG tablet Take 100 mg by mouth 3 (three) times daily.    . Insulin Lispro Prot & Lispro (HUMALOG MIX 75/25 KWIKPEN) (75-25) 100 UNIT/ML Kwikpen Inject 46-56 Units into the skin 2 (two) times daily with a meal. Inject 56units subcutaneously before breakfast and 46units before supper    . Insulin NPH Isophane & Regular (NOVOLIN 70/30 [place]) Inject 56 Units/day into the skin every morning. 46 units at night    . lisinopril (PRINIVIL,ZESTRIL) 10 MG tablet Take 10 mg by mouth daily.   0  . metoprolol (LOPRESSOR) 100 MG tablet Take 100 mg by mouth 2 (two) times daily.    . Multiple Vitamin (MULTIVITAMIN WITH MINERALS) TABS tablet Take 1 tablet by mouth daily.    . prednisoLONE acetate (PRED FORTE) 1 % ophthalmic suspension Place 1 drop into the left eye 4 (four) times daily.  0   No current facility-administered medications for this visit.     REVIEW OF SYSTEMS:   '[X]'  denotes positive finding, '[ ]'  denotes negative finding Cardiac  Comments:  Chest pain or chest pressure:    Shortness of breath upon exertion:    Short of breath when lying flat:    Irregular heart rhythm: x       Vascular    Pain in calf, thigh, or hip brought on by ambulation: x   Pain in feet at night that wakes you up from your sleep:  x   Blood clot in your veins:    Leg swelling:         Pulmonary    Oxygen at home:    Productive cough:     Wheezing:         Neurologic    Sudden weakness in arms or legs:     Sudden numbness in arms or legs:     Sudden onset of difficulty speaking or slurred speech:    Temporary loss of vision in one eye:     Problems with dizziness:         Gastrointestinal    Blood in stool:     Vomited blood:         Genitourinary    Burning when urinating:     Blood in urine:        Psychiatric    Major depression:         Hematologic    Bleeding problems:    Problems with blood clotting too easily:        Skin    Rashes  or ulcers:         Constitutional    Fever or chills:      PHYSICAL EXAM:   Vitals:   08/12/17 0836 08/12/17 0837  BP: (!) 146/73 (!) 144/75  Pulse: 65   Resp: 18   Temp: 97.9 F (36.6 C)   TempSrc: Oral   SpO2: 96%   Weight: 264 lb 11.2 oz (120.1 kg)   Height: 6' (1.829 m)     GENERAL: The patient is a well-nourished male, in no acute distress. The vital signs are documented above. CARDIAC: There is a regular rate and rhythm.  VASCULAR: Nonpalpable right pedal pulse. PULMONARY: Non-labored respirations  MUSCULOSKELETAL: There are no major deformities or cyanosis. NEUROLOGIC: No focal weakness or paresthesias are detected. SKIN: There are no ulcers or rashes noted. PSYCHIATRIC: The patient has a normal affect.  STUDIES:   I have reviewed his CT angiography with the following findings: 1. Occlusion involving the right distal femoral/above knee popliteal artery to the below-knee popliteal with perfusion of the tibial trunk from collaterals. Faintly visualized anterior, posterior and peroneal arteries with perfusion within the anterior and posterior tibial arteries to the ankle joint identified. The peroneal artery is less confidently visualized along its entirety. These results were called by telephone at the time of interpretation on 08/08/2017 at 11:44 pm to Dr. Zenovia Jarred , who verbally acknowledged these results. 2. Moderate calcific atherosclerosis with greater than 50% luminal narrowing of the left proximal common iliac artery. 3. Moderate stenosis of the proximal right femoral artery due to atherosclerotic calcifications, with slightly greater than 50% luminal narrowing.  MEDICAL ISSUES:   Right popliteal occlusion: I discussed with the patient that his symptoms currently are claudication.  Previously, when we have intervened, he has had a nonhealing wound.  Because the circumstances are different now, I told him that we would approach this differently.  It appears that his  popliteal occlusion extends below the knee.  Since he only has claudication I told him I would like to try to treat this nonoperatively.  I stressed the importance of continued smoking cessation.  We discussed the details of an exercise program including walking 3-5 times a week to the point of claudication symptoms and in trying to walk further.  We also discussed adding cilostazol to his medical regimen see if he has any improvement.  He is going to follow up in 6 weeks.  He will contact us sooner should he develop worsening symptoms or nonhealing wound.  Hopefully at 6 weeks he will state that his symptoms are improved and he will not need any intervention.  I suspect, if he does need intervention, he is looking at bypass.  I will have him follow-up with Dr. Scot Dock in 6 weeks since he performed his first surgery.    Annamarie Major, MD Vascular and Vein Specialists of [facility] [phone] Pager [phone]

## 2017-08-15 ENCOUNTER — Encounter (HOSPITAL_COMMUNITY)
Admission: RE | Admit: 2017-08-15 | Discharge: 2017-08-15 | Disposition: A | Discharge status: Home / Self Care | Payer: Medicare Other | Source: Ambulatory Visit | Attending: Nephrology | Admitting: Nephrology

## 2017-08-15 DIAGNOSIS — N183 Chronic kidney disease, stage 3 unspecified: Secondary | ICD-10-CM

## 2017-08-15 DIAGNOSIS — D631 Anemia in chronic kidney disease: Secondary | ICD-10-CM | POA: Insufficient documentation

## 2017-08-15 DIAGNOSIS — E1122 Type 2 diabetes mellitus with diabetic chronic kidney disease: Secondary | ICD-10-CM

## 2017-08-15 LAB — POCT HEMOGLOBIN-HEMACUE: HEMOGLOBIN: 11.5 g/dL — AB (ref 13.0–17.0)

## 2017-08-15 MED ORDER — DARBEPOETIN ALFA 60 MCG/0.3ML IJ SOSY
60.0000 ug | PREFILLED_SYRINGE | INTRAMUSCULAR | Status: DC
  Administered 2017-08-15: 60 ug via SUBCUTANEOUS

## 2017-08-15 MED ORDER — DARBEPOETIN ALFA 60 MCG/0.3ML IJ SOSY
PREFILLED_SYRINGE | INTRAMUSCULAR | Status: AC
  Filled 2017-08-15: qty 0.3

## 2017-08-28 DIAGNOSIS — Z45010 Encounter for checking and testing of cardiac pacemaker pulse generator [battery]: Secondary | ICD-10-CM | POA: Diagnosis not present

## 2017-08-28 DIAGNOSIS — Z950 Presence of cardiac pacemaker: Secondary | ICD-10-CM | POA: Diagnosis not present

## 2017-08-29 ENCOUNTER — Encounter (HOSPITAL_COMMUNITY)
Admission: RE | Admit: 2017-08-29 | Discharge: 2017-08-29 | Disposition: A | Discharge status: Home / Self Care | Payer: Medicare Other | Source: Ambulatory Visit | Attending: Nephrology | Admitting: Nephrology

## 2017-08-29 DIAGNOSIS — E1122 Type 2 diabetes mellitus with diabetic chronic kidney disease: Secondary | ICD-10-CM

## 2017-08-29 DIAGNOSIS — D631 Anemia in chronic kidney disease: Secondary | ICD-10-CM | POA: Diagnosis not present

## 2017-08-29 DIAGNOSIS — N183 Chronic kidney disease, stage 3 unspecified: Secondary | ICD-10-CM

## 2017-08-29 LAB — IRON AND TIBC
IRON: 58 ug/dL (ref 45–182)
SATURATION RATIOS: 24 % (ref 17.9–39.5)
TIBC: 246 ug/dL — AB (ref 250–450)
UIBC: 188 ug/dL

## 2017-08-29 LAB — POCT HEMOGLOBIN-HEMACUE: HEMOGLOBIN: 11 g/dL — AB (ref 13.0–17.0)

## 2017-08-29 LAB — FERRITIN: FERRITIN: 114 ng/mL (ref 24–336)

## 2017-08-29 MED ORDER — DARBEPOETIN ALFA 60 MCG/0.3ML IJ SOSY
60.0000 ug | PREFILLED_SYRINGE | INTRAMUSCULAR | Status: DC
  Administered 2017-08-29: 60 ug via SUBCUTANEOUS

## 2017-08-29 MED ORDER — DARBEPOETIN ALFA 60 MCG/0.3ML IJ SOSY
PREFILLED_SYRINGE | INTRAMUSCULAR | Status: AC
  Administered 2017-08-29: 60 ug via SUBCUTANEOUS
  Filled 2017-08-29: qty 0.3

## 2017-09-03 ENCOUNTER — Telehealth: Payer: Self-pay | Admitting: Vascular Surgery

## 2017-09-03 NOTE — Telephone Encounter (Signed)
-----   Message from Mena Goes, RN sent at 09/02/2017  5:00 PM EDT ----- Regarding: Appt for Wednesday with [facility]SD This patient is coming in on Wednesday 10-24 at 1:15 for the 1:30 slot,  followup on toe, turning black, open sore, drainage. Patient is aware of appt, I held a spot

## 2017-09-04 ENCOUNTER — Ambulatory Visit (HOSPITAL_COMMUNITY)
Admission: RE | Admit: 2017-09-04 | Discharge: 2017-09-04 | Disposition: A | Discharge status: Home / Self Care | Payer: Medicare Other | Source: Ambulatory Visit | Attending: Vascular Surgery | Admitting: Vascular Surgery

## 2017-09-04 ENCOUNTER — Encounter: Payer: Self-pay | Admitting: Vascular Surgery

## 2017-09-04 ENCOUNTER — Other Ambulatory Visit: Payer: Self-pay

## 2017-09-04 ENCOUNTER — Encounter: Payer: Self-pay | Admitting: *Deleted

## 2017-09-04 ENCOUNTER — Ambulatory Visit (INDEPENDENT_AMBULATORY_CARE_PROVIDER_SITE_OTHER): Payer: Medicare Other | Admitting: Vascular Surgery

## 2017-09-04 ENCOUNTER — Other Ambulatory Visit: Payer: Self-pay | Admitting: *Deleted

## 2017-09-04 VITALS — BP 159/73 | HR 67 | Temp 98.4°F | Resp 20 | Ht 72.0 in | Wt 268.0 lb

## 2017-09-04 DIAGNOSIS — I739 Peripheral vascular disease, unspecified: Secondary | ICD-10-CM | POA: Diagnosis not present

## 2017-09-04 DIAGNOSIS — Z01818 Encounter for other preprocedural examination: Secondary | ICD-10-CM | POA: Diagnosis not present

## 2017-09-04 NOTE — Progress Notes (Signed)
Patient name: Alexander [name] MRN: [id number] DOB: 1961/06/28 Sex: male  REASON FOR CONSULT: PAD with new ulcer between 4th and 5th digits right LE  HPI: Alexander [name] is a 56 y.o. male,  who is back for follow-up with new right foot ulcer that started 3-4 days ago. He is status post left femoral to tibioperoneal trunk bypass on 10/21/2013 by Dr. Scot Dock. This was done for a nonhealing wound which ultimately required a transmetatarsal amputation performed by Dr. Berenice Primas. The patient also had a wound on his right leg and on 11/08/2015 I performed atherectomy with drug coated balloon angioplasty for a tandem above-the-knee popliteal stenosis.  On 10/10/2016 he presented with a high-grade stenosis in his right popliteal artery as well as rest pain and Dr. Donzetta Matters performed drug coated balloon and plasty as well as atherectomy of the right superficial femoral and popliteal artery.    Approximate 3 weeks ago he began experiencing worsening pain in his right leg especially with walking.  He describes his leg is feeling tight and if he is walking on a rock on his heel.  He does get cramping with short distance walking.  He feels his leg is cold to the touch.  He went to the emergency department.  A CT angiogram of the right leg was performed which showed popliteal artery occlusion.  He saw Dr. Trula Slade 08/12/2017 He was given a walking program,  adding cilostazol, and a 6 week follow up appointment.    Past medical history includes: DM, CKD last Cr was 2.16 08/08/2017, HTN, hypercholesterolemia, Hx of tobacco abuse( he has stopped smoking), Atrial fib managed with Eliquis and 81 mg aspirin, and Psychologist, forensic.   Past Medical History:  Diagnosis Date  . Alcohol abuse   . Anemia   . Arthritis   . Atrial fibrillation ([place])   . Cardiac syncope 10/07/14   rapid A fib with 8 sec pauses on converison with syncope- temp pacing wire placed then PPM  . Cataract    BILATERAL  . Chronic kidney disease   . Coronary  artery disease   . Diabetes mellitus    dx---been  awhile  . Hyperlipemia   . Hypertension   . Peripheral vascular disease ([place])   . Presence of permanent cardiac pacemaker 10/08/2014   Past Surgical History:  Procedure Laterality Date  . AMPUTATION Left 08/19/2013   Procedure: AMPUTATION RAY;  Surgeon: Alta Corning, MD;  Location: [place];  Service: Orthopedics;  Laterality: Left;  ray amputation left 5th  . AMPUTATION Left 10/27/2013   Procedure: AMPUTATION DIGIT-LEFT 4TH TOE, 4th and 5th metatarsal.;  Surgeon: Angelia Mould, MD;  Location: [place];  Service: Vascular;  Laterality: Left;  . AMPUTATION Left 11/04/2013   Procedure: LEFT FOOT TRANS-METATARSAL AMPUTATION WITH WOUND CLOSURE ;  Surgeon: Alta Corning, MD;  Location: [place];  Service: Orthopedics;  Laterality: Left;  . CARDIAC CATHETERIZATION  10/07/2014   Procedure: LEFT HEART CATH AND CORONARY ANGIOGRAPHY;  Surgeon: Laverda Page, MD;  Location: [facility] CATH LAB;  Service: Cardiovascular;;  . FEMORAL-TIBIAL BYPASS GRAFT Left 10/27/2013   Procedure: BYPASS GRAFT LEFT FEMORAL- POSTERIOR TIBIAL ARTERY;  Surgeon: Angelia Mould, MD;  Location: [place];  Service: Vascular;  Laterality: Left;  . I&D EXTREMITY Left 10/27/2013   Procedure: IRRIGATION AND DEBRIDEMENT EXTREMITY- LEFT FOOT;  Surgeon: Angelia Mould, MD;  Location: [place];  Service: Vascular;  Laterality: Left;  . LEFT HEART CATHETERIZATION WITH CORONARY ANGIOGRAM N/A 11/09/2014  Procedure: LEFT HEART CATHETERIZATION WITH CORONARY ANGIOGRAM;  Surgeon: Laverda Page, MD;  Location: [facility] CATH LAB;  Service: Cardiovascular;  Laterality: N/A;  . LOWER EXTREMITY ANGIOGRAM Right 11/08/2015   Procedure: Lower Extremity Angiogram;  Surgeon: Serafina Mitchell, MD;  Location: [place] CV LAB;  Service: Cardiovascular;  Laterality: Right;  . LUMBAR LAMINECTOMY  06/13/2012   Procedure: MICRODISCECTOMY LUMBAR LAMINECTOMY;  Surgeon: Jessy Oto, MD;  Location: [place];   Service: Orthopedics;  Laterality: N/A;  Central laminectomy L2-3, L3-4, L4-5  . PACEMAKER INSERTION  10/08/14   MDT Advisa MRI compatible dual chamber pacemaker implanted by Dr Caryl Comes for syncope with post-termination pauses  . PERCUTANEOUS CORONARY STENT INTERVENTION (PCI-S)  11/09/2014   des to lad & distal circumflex         Dr  [name]  . PERIPHERAL VASCULAR CATHETERIZATION N/A 11/08/2015   Procedure: Abdominal Aortogram;  Surgeon: Serafina Mitchell, MD;  Location: [place] CV LAB;  Service: Cardiovascular;  Laterality: N/A;  . PERIPHERAL VASCULAR CATHETERIZATION Right 11/08/2015   Procedure: Peripheral Vascular Atherectomy;  Surgeon: Serafina Mitchell, MD;  Location: [facility];  Service: Cardiovascular;  Laterality: Right;  . PERIPHERAL VASCULAR CATHETERIZATION N/A 10/10/2016   Procedure: Lower Extremity Angiography;  Surgeon: Waynetta Sandy, MD;  Location: [place] CV LAB;  Service: Cardiovascular;  Laterality: N/A;  . PERIPHERAL VASCULAR CATHETERIZATION Right 10/10/2016   Procedure: Peripheral Vascular Atherectomy;  Surgeon: Waynetta Sandy, MD;  Location: [place] CV LAB;  Service: Cardiovascular;  Laterality: Right;  Popliteal  . PERMANENT PACEMAKER INSERTION N/A 10/08/2014   Procedure: PERMANENT PACEMAKER INSERTION;  Surgeon: Deboraha Sprang, MD;  Location: [facility] CATH LAB;  Service: Cardiovascular;  Laterality: N/A;  . TEMPORARY PACEMAKER INSERTION Bilateral 10/07/2014   Procedure: TEMPORARY PACEMAKER INSERTION;  Surgeon: Laverda Page, MD;  [facility] C[facility] LAB;  Service: Cardiovascular;  Laterality: Bilateral;    Family History  Problem Relation Age of Onset  . Diabetes type II Mother   . Hypertension Mother   . Diabetes Mother   . Liver cancer Father   . Diabetes type II Sister   . Breast cancer Sister   . Diabetes Sister   . Hypertension Sister   . Diabetes type II Brother   . Kidney failure Brother   . Diabetes Brother   . Hypertension  Brother   . Diabetes type II Sister     SOCIAL HISTORY: Social History   Social History  . Marital status: Married    Spouse name: N/A  . Number of children: 2  . Years of education: N/A   Occupational History  . Disabled    Social History Main Topics  . Smoking status: Former Smoker    Packs/day: 1.50    Years: 30.00    Types: Cigars    Quit date: 10/07/2014  . Smokeless tobacco: Never Used  . Alcohol use 21.0 oz/week    35 Cans of beer per week     Comment: 6 of 12 oz beers a day  . Drug use: No  . Sexual activity: Not on file   Other Topics Concern  . Not on file   Social History Narrative  . No narrative on file    Allergies  Allergen Reactions  . Sulfa Antibiotics Rash    Current Outpatient Prescriptions  Medication Sig Dispense Refill  . ACCU-CHEK FASTCLIX LANCETS MISC Check blood sugar TID & QHS 102 each 2  . amLODipine (NORVASC) 10 MG  tablet   1  . apixaban (ELIQUIS) 5 MG TABS tablet Take 1 tablet (5 mg total) by mouth 2 (two) times daily. 180 tablet 3  . aspirin 81 MG chewable tablet Chew by mouth daily.    Marland Kitchen atorvastatin (LIPITOR) 20 MG tablet Take 20 tablets by mouth daily.  2  . blood glucose meter kit and supplies KIT Check blood sugar TID & QHS 1 each 0  . Blood Glucose Monitoring Suppl (ACCU-CHEK ADVANTAGE DIABETES) kit Use as instructed 1 each 0  . chlorthalidone (HYGROTON) 50 MG tablet Take 50 mg by mouth daily.    Marland Kitchen glucose blood (ACCU-CHEK AVIVA PLUS) test strip Use as instructed 100 each 12  . glucose blood (CHOICE DM FORA G20 TEST STRIPS) test strip Check blood sugar TID & QHS 100 each 12  . glucose blood test strip Use as instructed 100 each 12  . hydrALAZINE (APRESOLINE) 100 MG tablet Take 100 mg by mouth 3 (three) times daily.    . Insulin NPH Isophane & Regular (NOVOLIN 70/30 [place]) Inject 56 Units/day into the skin every morning. 46 units at night    . lisinopril (PRINIVIL,ZESTRIL) 10 MG tablet Take 10 mg by mouth daily.   0  .  metoprolol (LOPRESSOR) 100 MG tablet Take 100 mg by mouth 2 (two) times daily.    . Multiple Vitamin (MULTIVITAMIN WITH MINERALS) TABS tablet Take 1 tablet by mouth daily.    . prazosin (MINIPRESS) 2 MG capsule   0  . amiodarone (PACERONE) 400 MG tablet Take 0.5 tablets (200 mg total) by mouth daily. (Patient not taking: Reported on 09/04/2017) 60 tablet 0  . cilostazol (PLETAL) 100 MG tablet Take 1 tablet (100 mg total) by mouth 2 (two) times daily before a meal. (Patient not taking: Reported on 09/04/2017) 60 tablet 11  . Insulin Lispro Prot & Lispro (HUMALOG MIX 75/25 KWIKPEN) (75-25) 100 UNIT/ML Kwikpen Inject 46-56 Units into the skin 2 (two) times daily with a meal. Inject 56units subcutaneously before breakfast and 46units before supper    . prednisoLONE acetate (PRED FORTE) 1 % ophthalmic suspension Place 1 drop into the left eye 4 (four) times daily.  0   No current facility-administered medications for this visit.     ROS:   General:  No weight loss, Fever, chills  HEENT: No recent headaches, no nasal bleeding, no visual changes, no sore throat  Neurologic: No dizziness, blackouts, seizures. No recent symptoms of stroke or mini- stroke. No recent episodes of slurred speech, or temporary blindness.  Cardiac: No recent episodes of chest pain/pressure, no shortness of breath at rest.  No shortness of breath with exertion.  Denies history of atrial fibrillation or irregular heartbeat  Vascular: Positive right LE rest pain in feet.  Positive history of claudication.  No history of non-healing ulcer, No history of DVT   Pulmonary: No home oxygen, no productive cough, no hemoptysis,  No asthma or wheezing  Musculoskeletal:  _0  Arthritis, _1  Low back pain,  _2  Joint pain  Hematologic:No history of hypercoagulable state.  No history of easy bleeding.  Positive history of anemia  Gastrointestinal: No hematochezia or melena,  No gastroesophageal reflux, no trouble  swallowing  Urinary: [ x] chronic Kidney disease, _3  on HD - _4  MWF or _5  TTHS, _6  Burning with urination, _7  Frequent urination, _8  Difficulty urinating;   Skin: No rashes, non healing ulcer right foot between 4th and 5th digits  Psychological:  No history of anxiety,  No history of depression   Physical Examination  Vitals:   09/04/17 1336 09/04/17 1340  BP: (!) 157/72 (!) 159/73  Pulse: 67   Resp: 20   Temp: 98.4 F (36.9 C)   TempSrc: Oral   SpO2: 96%   Weight: 268 lb (121.6 kg)   Height: 6' (1.829 m)     Body mass index is 36.35 kg/m.  General:  Alert and oriented, no acute distress HEENT: Normal Neck: No bruit or JVD Pulmonary: Clear to auscultation bilaterally Cardiac: Regular Rate and Rhythm without murmur Abdomen: Soft, non-tender, non-distended, no mass, no scars Skin: No rash Extremity Pulses:  2+ radial, brachial, femoral pulses bilaterally, not palpable dorsalis pedis, posterior tibial.  Doppler signal monophasic right DP/PT Musculoskeletal: Left transmetatarsal amputation well healed, positive right LE edema  Neurologic: Upper and lower extremity motor 5/5 and grossly symmetric  DATA:  Vein mapping in progress today right LE Right LE saphenous vein mapping 09/04/2017 0.82 Femoral junction to 0.28 distal calf.  CT angiogram of the right leg was performed which showed popliteal artery occlusion.  08/08/2017  ASSESSMENT:   Right popliteal occlusion Claudication , non healing wound right foot and rest pain.  PLAN:   We will get right LE vein mapping today appears acceptable  Monday 10 29 he will be scheduled for angiogram with CO2 due to CKD by Dr. Scot Dock.  He plans to use a right femoral artery as his stick site, not the left to prevent injury to the by pass on the left LE.  He was told to hold his Eliquis for 48 hours prior to the angiogram.  Pending results he will be scheduled for right LE by pass on Tuesday 09/10/2017 by Dr. Scot Dock.   Continue  maximum medical management with his statin, 81 mg aspirin, and BP control.  Dry guaze between the toes where the non healing wound is.   COLLINS, EMMA MAUREEN PA-C [facility] of [place]:   I have interviewed the patient and examined the patient. I agree with the findings by the PA This patient is well-known to me.  He underwent a left femoral to tibial peroneal trunk bypass with vein in 2014 for gangrenous wound on the left foot.  He ultimately healed his toe amputations.  He now presents with a wound between the fourth and fifth toes on the right foot.  He has had 2 attempts at an endovascular approach to his infrainguinal arterial occlusive disease.  On 11/08/2015, he had atherectomy of the right popliteal artery and drug-coated balloon angioplasty of the right popliteal artery.  He subsequently presented with rest pain of the right foot and was found to have a tight stenosis of the popliteal artery.  On 10/10/2016 he underwent jetstream atherectomy of the right superficial femoral artery and popliteal arteries, drug-coated balloon angioplasty of the right popliteal artery, and drug-coated balloon angioplasty of the right superficial femoral artery.  He was seen by Dr. Trula Slade in the office on 08/12/2017.  At that time he was having right leg pain but did not have any open ulcers on his foot.  A CT angiogram of his right leg that was performed at that time which showed a popliteal artery occlusion.  However, given that he was only having claudication at the time he was set up for a follow-up visit with me in 6 weeks.  He now has a wound of the right foot and clearly  a limb threatening situation given his infrainguinal arterial occlusive disease and diabetes with a nonhealing wound of the foot.  The situation is further complicated by his renal insufficiency.  08/08/2017 his creatinine was 2.16 with a GFR of 32.   I have recommended that we  proceed with arteriography that  need to be done with CO2 and possibly very limited contrast.  I have tentatively scheduled him for a bypass the following day as we will be holding his Eliquis 48 hours prior to his arteriogram.  His arteriogram is scheduled for 09/09/2017 with plans for a bypass on 09/10/2017.  The schedule was shifted around so that Dr. Donzetta Matters will be performing the procedure.  Certainly if he feels that there is an endovascular approach that I think this would be reasonable.  We will make further recommendations pending the results of his arteriogram.  Gae Gallop, MD [phone]

## 2017-09-05 ENCOUNTER — Telehealth: Payer: Self-pay | Admitting: *Deleted

## 2017-09-05 ENCOUNTER — Other Ambulatory Visit: Payer: Self-pay | Admitting: *Deleted

## 2017-09-05 NOTE — Telephone Encounter (Signed)
Patient called and instructed to hold Eliquis per OK with Dr. [name] (no Bridge).

## 2017-09-06 ENCOUNTER — Other Ambulatory Visit: Payer: Self-pay | Admitting: Vascular Surgery

## 2017-09-06 ENCOUNTER — Encounter: Payer: Self-pay | Admitting: Vascular Surgery

## 2017-09-06 ENCOUNTER — Telehealth: Payer: Self-pay | Admitting: *Deleted

## 2017-09-06 NOTE — Telephone Encounter (Signed)
Spoke to April Rn at Dr. Irven Shelling office, stated they will fax Cardiac  Clearance.

## 2017-09-09 ENCOUNTER — Encounter (HOSPITAL_COMMUNITY)
Admission: AD | Disposition: A | Discharge status: Home Health | Payer: Self-pay | Source: Ambulatory Visit | Attending: Vascular Surgery

## 2017-09-09 ENCOUNTER — Ambulatory Visit: Payer: Medicare Other | Admitting: Family

## 2017-09-09 ENCOUNTER — Encounter (HOSPITAL_COMMUNITY): Payer: Self-pay

## 2017-09-09 ENCOUNTER — Encounter (HOSPITAL_COMMUNITY): Payer: Medicare Other

## 2017-09-09 ENCOUNTER — Inpatient Hospital Stay (HOSPITAL_COMMUNITY)
Admission: AD | Admit: 2017-09-09 | Discharge: 2017-09-16 | DRG: 253 | Disposition: A | Discharge status: Home Health | Payer: Medicare Other | Source: Ambulatory Visit | Attending: Vascular Surgery | Admitting: Vascular Surgery

## 2017-09-09 DIAGNOSIS — Z89432 Acquired absence of left foot: Secondary | ICD-10-CM

## 2017-09-09 DIAGNOSIS — I739 Peripheral vascular disease, unspecified: Secondary | ICD-10-CM

## 2017-09-09 DIAGNOSIS — E1136 Type 2 diabetes mellitus with diabetic cataract: Secondary | ICD-10-CM | POA: Diagnosis present

## 2017-09-09 DIAGNOSIS — M79604 Pain in right leg: Secondary | ICD-10-CM | POA: Diagnosis present

## 2017-09-09 DIAGNOSIS — Z950 Presence of cardiac pacemaker: Secondary | ICD-10-CM

## 2017-09-09 DIAGNOSIS — Z419 Encounter for procedure for purposes other than remedying health state, unspecified: Secondary | ICD-10-CM

## 2017-09-09 DIAGNOSIS — D62 Acute posthemorrhagic anemia: Secondary | ICD-10-CM | POA: Diagnosis not present

## 2017-09-09 DIAGNOSIS — Z7901 Long term (current) use of anticoagulants: Secondary | ICD-10-CM

## 2017-09-09 DIAGNOSIS — I70235 Atherosclerosis of native arteries of right leg with ulceration of other part of foot: Secondary | ICD-10-CM | POA: Diagnosis not present

## 2017-09-09 DIAGNOSIS — E1122 Type 2 diabetes mellitus with diabetic chronic kidney disease: Secondary | ICD-10-CM | POA: Diagnosis not present

## 2017-09-09 DIAGNOSIS — T85898A Other specified complication of other internal prosthetic devices, implants and grafts, initial encounter: Secondary | ICD-10-CM | POA: Diagnosis not present

## 2017-09-09 DIAGNOSIS — I129 Hypertensive chronic kidney disease with stage 1 through stage 4 chronic kidney disease, or unspecified chronic kidney disease: Secondary | ICD-10-CM | POA: Diagnosis present

## 2017-09-09 DIAGNOSIS — Z7982 Long term (current) use of aspirin: Secondary | ICD-10-CM | POA: Diagnosis not present

## 2017-09-09 DIAGNOSIS — E1152 Type 2 diabetes mellitus with diabetic peripheral angiopathy with gangrene: Secondary | ICD-10-CM | POA: Diagnosis not present

## 2017-09-09 DIAGNOSIS — Z79899 Other long term (current) drug therapy: Secondary | ICD-10-CM

## 2017-09-09 DIAGNOSIS — E1169 Type 2 diabetes mellitus with other specified complication: Secondary | ICD-10-CM | POA: Diagnosis not present

## 2017-09-09 DIAGNOSIS — N182 Chronic kidney disease, stage 2 (mild): Secondary | ICD-10-CM | POA: Diagnosis present

## 2017-09-09 DIAGNOSIS — Z955 Presence of coronary angioplasty implant and graft: Secondary | ICD-10-CM

## 2017-09-09 DIAGNOSIS — E7800 Pure hypercholesterolemia, unspecified: Secondary | ICD-10-CM | POA: Diagnosis not present

## 2017-09-09 DIAGNOSIS — Z9889 Other specified postprocedural states: Secondary | ICD-10-CM | POA: Diagnosis not present

## 2017-09-09 DIAGNOSIS — I70209 Unspecified atherosclerosis of native arteries of extremities, unspecified extremity: Secondary | ICD-10-CM | POA: Diagnosis present

## 2017-09-09 DIAGNOSIS — I96 Gangrene, not elsewhere classified: Secondary | ICD-10-CM | POA: Diagnosis present

## 2017-09-09 DIAGNOSIS — Z87891 Personal history of nicotine dependence: Secondary | ICD-10-CM

## 2017-09-09 DIAGNOSIS — I2510 Atherosclerotic heart disease of native coronary artery without angina pectoris: Secondary | ICD-10-CM | POA: Diagnosis present

## 2017-09-09 DIAGNOSIS — Z794 Long term (current) use of insulin: Secondary | ICD-10-CM | POA: Diagnosis not present

## 2017-09-09 DIAGNOSIS — L98499 Non-pressure chronic ulcer of skin of other sites with unspecified severity: Secondary | ICD-10-CM | POA: Diagnosis present

## 2017-09-09 DIAGNOSIS — I4891 Unspecified atrial fibrillation: Secondary | ICD-10-CM | POA: Diagnosis not present

## 2017-09-09 DIAGNOSIS — I10 Essential (primary) hypertension: Secondary | ICD-10-CM | POA: Diagnosis not present

## 2017-09-09 DIAGNOSIS — T82868A Thrombosis of vascular prosthetic devices, implants and grafts, initial encounter: Secondary | ICD-10-CM | POA: Diagnosis not present

## 2017-09-09 DIAGNOSIS — M869 Osteomyelitis, unspecified: Secondary | ICD-10-CM | POA: Diagnosis not present

## 2017-09-09 HISTORY — PX: IR ANGIOGRAM FOLLOW UP STUDY: IMG697

## 2017-09-09 HISTORY — PX: ABDOMINAL AORTOGRAM W/LOWER EXTREMITY: CATH118223

## 2017-09-09 LAB — COMPREHENSIVE METABOLIC PANEL
ALK PHOS: 83 U/L (ref 38–126)
ALT: 18 U/L (ref 17–63)
ANION GAP: 10 (ref 5–15)
AST: 13 U/L — ABNORMAL LOW (ref 15–41)
Albumin: 3.1 g/dL — ABNORMAL LOW (ref 3.5–5.0)
BUN: 36 mg/dL — ABNORMAL HIGH (ref 6–20)
CALCIUM: 8.7 mg/dL — AB (ref 8.9–10.3)
CO2: 19 mmol/L — ABNORMAL LOW (ref 22–32)
CREATININE: 1.51 mg/dL — AB (ref 0.61–1.24)
Chloride: 106 mmol/L (ref 101–111)
GFR calc non Af Amer: 49 mL/min — ABNORMAL LOW (ref >60)
GFR, EST AFRICAN AMERICAN: 57 mL/min — AB (ref >60)
Glucose, Bld: 307 mg/dL — ABNORMAL HIGH (ref 65–99)
Potassium: 4.2 mmol/L (ref 3.5–5.1)
Sodium: 135 mmol/L (ref 135–145)
Total Bilirubin: 0.6 mg/dL (ref 0.3–1.2)
Total Protein: 6.4 g/dL — ABNORMAL LOW (ref 6.5–8.1)

## 2017-09-09 LAB — POCT I-STAT, CHEM 8
BUN: 36 mg/dL — AB (ref 6–20)
CALCIUM ION: 1.24 mmol/L (ref 1.15–1.40)
CHLORIDE: 109 mmol/L (ref 101–111)
Creatinine, Ser: 1.6 mg/dL — ABNORMAL HIGH (ref 0.61–1.24)
Glucose, Bld: 193 mg/dL — ABNORMAL HIGH (ref 65–99)
HEMATOCRIT: 29 % — AB (ref 39.0–52.0)
Hemoglobin: 9.9 g/dL — ABNORMAL LOW (ref 13.0–17.0)
Potassium: 4.5 mmol/L (ref 3.5–5.1)
SODIUM: 140 mmol/L (ref 135–145)
TCO2: 22 mmol/L (ref 22–32)

## 2017-09-09 LAB — URINALYSIS, ROUTINE W REFLEX MICROSCOPIC
Bilirubin Urine: NEGATIVE
GLUCOSE, UA: 50 mg/dL — AB
HGB URINE DIPSTICK: NEGATIVE
Ketones, ur: NEGATIVE mg/dL
Leukocytes, UA: NEGATIVE
Nitrite: NEGATIVE
PH: 5 (ref 5.0–8.0)
Protein, ur: NEGATIVE mg/dL
SPECIFIC GRAVITY, URINE: 1.018 (ref 1.005–1.030)

## 2017-09-09 LAB — CBC
HCT: 29.9 % — ABNORMAL LOW (ref 39.0–52.0)
HEMOGLOBIN: 10.1 g/dL — AB (ref 13.0–17.0)
MCH: 30.1 pg (ref 26.0–34.0)
MCHC: 33.8 g/dL (ref 30.0–36.0)
MCV: 89.3 fL (ref 78.0–100.0)
PLATELETS: 194 10*3/uL (ref 150–400)
RBC: 3.35 MIL/uL — AB (ref 4.22–5.81)
RDW: 12.1 % (ref 11.5–15.5)
WBC: 6.3 10*3/uL (ref 4.0–10.5)

## 2017-09-09 LAB — APTT: aPTT: 55 seconds — ABNORMAL HIGH (ref 24–36)

## 2017-09-09 LAB — PROTIME-INR
INR: 1.18
PROTHROMBIN TIME: 14.9 s (ref 11.4–15.2)

## 2017-09-09 LAB — TYPE AND SCREEN
ABO/RH(D): A POS
Antibody Screen: NEGATIVE

## 2017-09-09 LAB — GLUCOSE, CAPILLARY
GLUCOSE-CAPILLARY: 179 mg/dL — AB (ref 65–99)
GLUCOSE-CAPILLARY: 274 mg/dL — AB (ref 65–99)
GLUCOSE-CAPILLARY: 273 mg/dL — AB (ref 65–99)
Glucose-Capillary: 161 mg/dL — ABNORMAL HIGH (ref 65–99)
Glucose-Capillary: 339 mg/dL — ABNORMAL HIGH (ref 65–99)

## 2017-09-09 LAB — SURGICAL PCR SCREEN
MRSA, PCR: NEGATIVE
Staphylococcus aureus: NEGATIVE

## 2017-09-09 SURGERY — ABDOMINAL AORTOGRAM W/LOWER EXTREMITY
Anesthesia: LOCAL

## 2017-09-09 MED ORDER — MIDAZOLAM HCL 2 MG/2ML IJ SOLN
INTRAMUSCULAR | Status: DC | PRN
  Administered 2017-09-09: 1 mg via INTRAVENOUS

## 2017-09-09 MED ORDER — CILOSTAZOL 100 MG PO TABS
100.0000 mg | ORAL_TABLET | Freq: Two times a day (BID) | ORAL | Status: DC
  Filled 2017-09-09 (×3): qty 1

## 2017-09-09 MED ORDER — SODIUM CHLORIDE 0.9% FLUSH: 3.0000 mL | Freq: Two times a day (BID) | INTRAVENOUS | Status: DC

## 2017-09-09 MED ORDER — INSULIN ASPART 100 UNIT/ML ~~LOC~~ SOLN
0.0000 [IU] | Freq: Three times a day (TID) | SUBCUTANEOUS | Status: DC
  Administered 2017-09-09: 11 [IU] via SUBCUTANEOUS
  Administered 2017-09-10: 5 [IU] via SUBCUTANEOUS

## 2017-09-09 MED ORDER — ADULT MULTIVITAMIN W/MINERALS CH: 1.0000 | ORAL_TABLET | Freq: Every day | ORAL | Status: DC

## 2017-09-09 MED ORDER — SODIUM CHLORIDE 0.9 % IV SOLN
INTRAVENOUS | Status: DC
  Administered 2017-09-09: 18:00:00 via INTRAVENOUS

## 2017-09-09 MED ORDER — FENTANYL CITRATE (PF) 100 MCG/2ML IJ SOLN
INTRAMUSCULAR | Status: AC
Start: 2017-09-09 — End: 2017-09-09
  Filled 2017-09-09: qty 2

## 2017-09-09 MED ORDER — AMIODARONE HCL 200 MG PO TABS: 200.0000 mg | ORAL_TABLET | Freq: Every day | ORAL | Status: DC

## 2017-09-09 MED ORDER — APIXABAN 5 MG PO TABS: 5.0000 mg | ORAL_TABLET | Freq: Two times a day (BID) | ORAL | Status: DC

## 2017-09-09 MED ORDER — MIDAZOLAM HCL 2 MG/2ML IJ SOLN
INTRAMUSCULAR | Status: AC
  Filled 2017-09-09: qty 2

## 2017-09-09 MED ORDER — LIDOCAINE HCL 2 % IJ SOLN
INTRAMUSCULAR | Status: AC
  Filled 2017-09-09: qty 20

## 2017-09-09 MED ORDER — SODIUM CHLORIDE 0.9 % IV SOLN: 250.0000 mL | INTRAVENOUS | Status: DC | PRN

## 2017-09-09 MED ORDER — CHLORTHALIDONE 50 MG PO TABS
50.0000 mg | ORAL_TABLET | Freq: Every day | ORAL | Status: DC
  Filled 2017-09-09: qty 1

## 2017-09-09 MED ORDER — HYDRALAZINE HCL 20 MG/ML IJ SOLN: 5.0000 mg | INTRAMUSCULAR | Status: DC | PRN

## 2017-09-09 MED ORDER — FENTANYL CITRATE (PF) 100 MCG/2ML IJ SOLN
INTRAMUSCULAR | Status: DC | PRN
  Administered 2017-09-09: 50 ug via INTRAVENOUS

## 2017-09-09 MED ORDER — IODIXANOL 320 MG/ML IV SOLN
INTRAVENOUS | Status: DC | PRN
  Administered 2017-09-09: 45 mL via INTRA_ARTERIAL

## 2017-09-09 MED ORDER — INSULIN NPH ISOPHANE & REGULAR (70-30) 100 UNIT/ML ~~LOC~~ SUSP: 56.0000 [IU] | Freq: Every day | SUBCUTANEOUS | Status: DC

## 2017-09-09 MED ORDER — MUPIROCIN 2 % EX OINT: 1.0000 "application " | TOPICAL_OINTMENT | Freq: Once | CUTANEOUS | Status: DC

## 2017-09-09 MED ORDER — ASPIRIN 81 MG PO CHEW: 81.0000 mg | CHEWABLE_TABLET | Freq: Every day | ORAL | Status: DC

## 2017-09-09 MED ORDER — LISINOPRIL 10 MG PO TABS: 10.0000 mg | ORAL_TABLET | Freq: Every day | ORAL | Status: DC

## 2017-09-09 MED ORDER — ATORVASTATIN CALCIUM 40 MG PO TABS: 40.0000 mg | ORAL_TABLET | Freq: Every day | ORAL | Status: DC

## 2017-09-09 MED ORDER — ONDANSETRON HCL 4 MG/2ML IJ SOLN
4.0000 mg | Freq: Four times a day (QID) | INTRAMUSCULAR | Status: DC | PRN
Start: 2017-09-09 — End: 2017-09-10

## 2017-09-09 MED ORDER — AMLODIPINE BESYLATE 10 MG PO TABS: 10.0000 mg | ORAL_TABLET | Freq: Every day | ORAL | Status: DC

## 2017-09-09 MED ORDER — SODIUM CHLORIDE 0.9 % WEIGHT BASED INFUSION
1.0000 mL/kg/h | INTRAVENOUS | Status: DC
Start: 2017-09-09 — End: 2017-09-09

## 2017-09-09 MED ORDER — SODIUM CHLORIDE 0.9% FLUSH: 3.0000 mL | INTRAVENOUS | Status: DC | PRN

## 2017-09-09 MED ORDER — ACETAMINOPHEN 325 MG PO TABS: 650.0000 mg | ORAL_TABLET | ORAL | Status: DC | PRN

## 2017-09-09 MED ORDER — INSULIN ASPART 100 UNIT/ML ~~LOC~~ SOLN
0.0000 [IU] | Freq: Every day | SUBCUTANEOUS | Status: DC
  Administered 2017-09-09: 3 [IU] via SUBCUTANEOUS
  Administered 2017-09-10: 2 [IU] via SUBCUTANEOUS
  Administered 2017-09-11: 5 [IU] via SUBCUTANEOUS
  Administered 2017-09-11: 2 [IU] via SUBCUTANEOUS
  Administered 2017-09-12: 3 [IU] via SUBCUTANEOUS
  Administered 2017-09-13: 2 [IU] via SUBCUTANEOUS
  Administered 2017-09-14: 3 [IU] via SUBCUTANEOUS
  Administered 2017-09-15: 2 [IU] via SUBCUTANEOUS

## 2017-09-09 MED ORDER — LABETALOL HCL 5 MG/ML IV SOLN: 10.0000 mg | INTRAVENOUS | Status: DC | PRN

## 2017-09-09 MED ORDER — CHLORHEXIDINE GLUCONATE 4 % EX LIQD
60.0000 mL | Freq: Once | CUTANEOUS | Status: AC
  Administered 2017-09-10: 4 via TOPICAL
  Filled 2017-09-09: qty 60

## 2017-09-09 MED ORDER — HEPARIN (PORCINE) IN NACL 2-0.9 UNIT/ML-% IJ SOLN
INTRAMUSCULAR | Status: AC
  Filled 2017-09-09: qty 1000

## 2017-09-09 MED ORDER — PRAZOSIN HCL 2 MG PO CAPS
2.0000 mg | ORAL_CAPSULE | Freq: Every evening | ORAL | Status: DC
  Administered 2017-09-09: 2 mg via ORAL
  Filled 2017-09-09 (×2): qty 1

## 2017-09-09 MED ORDER — DEXTROSE 5 % IV SOLN
1.5000 g | INTRAVENOUS | Status: DC
  Filled 2017-09-09: qty 1.5

## 2017-09-09 MED ORDER — CHLORHEXIDINE GLUCONATE 4 % EX LIQD
60.0000 mL | Freq: Once | CUTANEOUS | Status: DC
  Filled 2017-09-09: qty 60

## 2017-09-09 MED ORDER — METOPROLOL TARTRATE 100 MG PO TABS
100.0000 mg | ORAL_TABLET | Freq: Two times a day (BID) | ORAL | Status: DC
  Administered 2017-09-09: 100 mg via ORAL
  Filled 2017-09-09: qty 1

## 2017-09-09 MED ORDER — HYDRALAZINE HCL 50 MG PO TABS
100.0000 mg | ORAL_TABLET | Freq: Three times a day (TID) | ORAL | Status: DC
  Administered 2017-09-09 (×2): 100 mg via ORAL
  Filled 2017-09-09 (×2): qty 2

## 2017-09-09 MED ORDER — SODIUM CHLORIDE 0.9 % IV SOLN
INTRAVENOUS | Status: DC
  Administered 2017-09-09: 10:00:00 via INTRAVENOUS

## 2017-09-09 SURGICAL SUPPLY — 13 items
CATH ANGIO 5F PIGTAIL 65CM (CATHETERS) ×2 IMPLANT
COVER PRB 48X5XTLSCP FOLD TPE (BAG) ×1 IMPLANT
COVER PROBE 5X48 (BAG) ×1
FILTER CO2 0.2 MICRON (VASCULAR PRODUCTS) ×2 IMPLANT
KIT MICROINTRODUCER STIFF 5F (SHEATH) ×2 IMPLANT
KIT PV (KITS) ×2 IMPLANT
RESERVOIR CO2 (VASCULAR PRODUCTS) ×2 IMPLANT
SET FLUSH CO2 (MISCELLANEOUS) ×2 IMPLANT
SHEATH PINNACLE 5F 10CM (SHEATH) ×2 IMPLANT
SYR MEDRAD MARK V 150ML (SYRINGE) ×2 IMPLANT
TRANSDUCER W/STOPCOCK (MISCELLANEOUS) ×2 IMPLANT
TRAY PV CATH (CUSTOM PROCEDURE TRAY) ×2 IMPLANT
WIRE HI TORQ VERSACORE J 260CM (WIRE) ×2 IMPLANT

## 2017-09-09 NOTE — Interval H&P Note (Signed)
History and Physical Interval Note:  09/09/2017 11:26 AM  Alexander [name]  has presented today for surgery, with the diagnosis of pad  The various methods of treatment have been discussed with the patient and family. After consideration of risks, benefits and other options for treatment, the patient has consented to  Procedure(s): ABDOMINAL AORTOGRAM W/LOWER EXTREMITY (N/A) as a surgical intervention .  The patient's history has been reviewed, patient examined, no change in status, stable for surgery.  I have reviewed the patient's chart and labs.  Questions were answered to the patient's satisfaction.     [name]

## 2017-09-09 NOTE — Op Note (Signed)
    NAME: [name]    MRN: [id number] DOB: [birth year]    DATE OF OPERATION: 09/09/2017  PREOP DIAGNOSIS:    Nonhealing wound right foot with infrainguinal arterial occlusive disease  POSTOP DIAGNOSIS:    Same  PROCEDURE:    1.  Conscious sedation 2.  Ultrasound-guided access to the right common femoral artery 3.  CO2 aortogram with right lower extremity runoff  SURGEON: Judeth Cornfield. Scot Dock, MD, FACS  ANESTHESIA: Local with sedation  EBL: Minimal  INDICATIONS:    Alexander [name] is a 56 y.o. male who presents with a nonhealing wound of the right fourth and fifth toes.  He has had 2 previous endovascular attempts at revascularization and presents with an occluded popliteal artery.  He has some mild chronic kidney disease and is admitted the night before for hydration.  Presents for elective arteriogram with plans for bypass tomorrow.  FINDINGS:   1.  No significant aortoiliac occlusive disease 2.  The right common femoral, deep femoral, superficial femoral arteries are patent.  Superficial femoral artery is occluded at the adductor canal with reconstitution of the popliteal artery below the knee.  There is two-vessel runoff on the right via the anterior tibial and posterior tibial arteries.  TECHNIQUE:   The patient was taken to the peripheral vascular lab and was sedated. The period of conscious sedation was 33 minutes.  During that time period, I was present face-to-face 100% of the time.  The patient was administered 1 mg of Versed and 50 mcg of fentanyl. The patient's heart rate, blood pressure, and oxygen saturation were monitored by the nurse continuously during the procedure.  Both groins were prepped and draped in usual sterile fashion.  Under ultrasound guidance, after the skin was anesthetized, the right common femoral artery was cannulated with a micropuncture needle and a micropuncture sheath introduced over the wire.  This was exchanged for a 5 [place] sheath over  a Bentson wire.  The pigtail catheter was positioned at the L1 vertebral body and flush aortogram obtained.  Catheter was positioned above the aortic bifurcation and an oblique iliac projection was obtained.  These were all done with CO2.  I then removed the catheter over a wire and a retrograde right femoral arteriogram was obtained to evaluate the femoral artery to the level of the knee.  These were all done with CO2.  Next I used a limited contrast to evaluate the lower extremity to find a distal bypass target.  The initial injection of 15 cc did not provide adequate visualization.  I repeated the study with 30 cc.  A total of 45 cc of contrast was used.  The anterior tibial and posterior tibial arteries were patent to the distal leg.  I did not obtain an additional lateral projection of the foot given my concerns for using dye.  CLINICAL NOTE: Patient is scheduled for a right femoral to below-knee popliteal artery bypass tomorrow with amputation of the right first and second toes.    [name], MD, FACS [facility]  DATE OF DICTATION:   09/09/2017

## 2017-09-09 NOTE — H&P (View-Only) (Signed)
Patient name: Alexander [name] MRN: [id number] DOB: 1961/06/28 Sex: male  REASON FOR CONSULT: PAD with new ulcer between 4th and 5th digits right LE  HPI: Alexander [name] is a 56 y.o. male,  who is back for follow-up with new right foot ulcer that started 3-4 days ago. He is status post left femoral to tibioperoneal trunk bypass on 10/21/2013 by Dr. Scot Dock. This was done for a nonhealing wound which ultimately required a transmetatarsal amputation performed by Dr. Berenice Primas. The patient also had a wound on his right leg and on 11/08/2015 I performed atherectomy with drug coated balloon angioplasty for a tandem above-the-knee popliteal stenosis.  On 10/10/2016 he presented with a high-grade stenosis in his right popliteal artery as well as rest pain and Dr. Donzetta Matters performed drug coated balloon and plasty as well as atherectomy of the right superficial femoral and popliteal artery.    Approximate 3 weeks ago he began experiencing worsening pain in his right leg especially with walking.  He describes his leg is feeling tight and if he is walking on a rock on his heel.  He does get cramping with short distance walking.  He feels his leg is cold to the touch.  He went to the emergency department.  A CT angiogram of the right leg was performed which showed popliteal artery occlusion.  He saw Dr. Trula Slade 08/12/2017 He was given a walking program,  adding cilostazol, and a 6 week follow up appointment.    Past medical history includes: DM, CKD last Cr was 2.16 08/08/2017, HTN, hypercholesterolemia, Hx of tobacco abuse( he has stopped smoking), Atrial fib managed with Eliquis and 81 mg aspirin, and Psychologist, forensic.   Past Medical History:  Diagnosis Date  . Alcohol abuse   . Anemia   . Arthritis   . Atrial fibrillation ([place])   . Cardiac syncope 10/07/14   rapid A fib with 8 sec pauses on converison with syncope- temp pacing wire placed then PPM  . Cataract    BILATERAL  . Chronic kidney disease   . Coronary  artery disease   . Diabetes mellitus    dx---been  awhile  . Hyperlipemia   . Hypertension   . Peripheral vascular disease ([place])   . Presence of permanent cardiac pacemaker 10/08/2014   Past Surgical History:  Procedure Laterality Date  . AMPUTATION Left 08/19/2013   Procedure: AMPUTATION RAY;  Surgeon: Alta Corning, MD;  Location: [place];  Service: Orthopedics;  Laterality: Left;  ray amputation left 5th  . AMPUTATION Left 10/27/2013   Procedure: AMPUTATION DIGIT-LEFT 4TH TOE, 4th and 5th metatarsal.;  Surgeon: Angelia Mould, MD;  Location: [place];  Service: Vascular;  Laterality: Left;  . AMPUTATION Left 11/04/2013   Procedure: LEFT FOOT TRANS-METATARSAL AMPUTATION WITH WOUND CLOSURE ;  Surgeon: Alta Corning, MD;  Location: [place];  Service: Orthopedics;  Laterality: Left;  . CARDIAC CATHETERIZATION  10/07/2014   Procedure: LEFT HEART CATH AND CORONARY ANGIOGRAPHY;  Surgeon: Laverda Page, MD;  Location: [facility] CATH LAB;  Service: Cardiovascular;;  . FEMORAL-TIBIAL BYPASS GRAFT Left 10/27/2013   Procedure: BYPASS GRAFT LEFT FEMORAL- POSTERIOR TIBIAL ARTERY;  Surgeon: Angelia Mould, MD;  Location: [place];  Service: Vascular;  Laterality: Left;  . I&D EXTREMITY Left 10/27/2013   Procedure: IRRIGATION AND DEBRIDEMENT EXTREMITY- LEFT FOOT;  Surgeon: Angelia Mould, MD;  Location: [place];  Service: Vascular;  Laterality: Left;  . LEFT HEART CATHETERIZATION WITH CORONARY ANGIOGRAM N/A 11/09/2014  Procedure: LEFT HEART CATHETERIZATION WITH CORONARY ANGIOGRAM;  Surgeon: Laverda Page, MD;  Location: [facility] CATH LAB;  Service: Cardiovascular;  Laterality: N/A;  . LOWER EXTREMITY ANGIOGRAM Right 11/08/2015   Procedure: Lower Extremity Angiogram;  Surgeon: Serafina Mitchell, MD;  Location: [place] CV LAB;  Service: Cardiovascular;  Laterality: Right;  . LUMBAR LAMINECTOMY  06/13/2012   Procedure: MICRODISCECTOMY LUMBAR LAMINECTOMY;  Surgeon: Jessy Oto, MD;  Location: [place];   Service: Orthopedics;  Laterality: N/A;  Central laminectomy L2-3, L3-4, L4-5  . PACEMAKER INSERTION  10/08/14   MDT Advisa MRI compatible dual chamber pacemaker implanted by Dr Caryl Comes for syncope with post-termination pauses  . PERCUTANEOUS CORONARY STENT INTERVENTION (PCI-S)  11/09/2014   des to lad & distal circumflex         Dr  [name]  . PERIPHERAL VASCULAR CATHETERIZATION N/A 11/08/2015   Procedure: Abdominal Aortogram;  Surgeon: Serafina Mitchell, MD;  Location: [place] CV LAB;  Service: Cardiovascular;  Laterality: N/A;  . PERIPHERAL VASCULAR CATHETERIZATION Right 11/08/2015   Procedure: Peripheral Vascular Atherectomy;  Surgeon: Serafina Mitchell, MD;  Location: [facility];  Service: Cardiovascular;  Laterality: Right;  . PERIPHERAL VASCULAR CATHETERIZATION N/A 10/10/2016   Procedure: Lower Extremity Angiography;  Surgeon: Waynetta Sandy, MD;  Location: [place] CV LAB;  Service: Cardiovascular;  Laterality: N/A;  . PERIPHERAL VASCULAR CATHETERIZATION Right 10/10/2016   Procedure: Peripheral Vascular Atherectomy;  Surgeon: Waynetta Sandy, MD;  Location: [place] CV LAB;  Service: Cardiovascular;  Laterality: Right;  Popliteal  . PERMANENT PACEMAKER INSERTION N/A 10/08/2014   Procedure: PERMANENT PACEMAKER INSERTION;  Surgeon: Deboraha Sprang, MD;  Location: [facility] CATH LAB;  Service: Cardiovascular;  Laterality: N/A;  . TEMPORARY PACEMAKER INSERTION Bilateral 10/07/2014   Procedure: TEMPORARY PACEMAKER INSERTION;  Surgeon: Laverda Page, MD;  [facility] C[facility] LAB;  Service: Cardiovascular;  Laterality: Bilateral;    Family History  Problem Relation Age of Onset  . Diabetes type II Mother   . Hypertension Mother   . Diabetes Mother   . Liver cancer Father   . Diabetes type II Sister   . Breast cancer Sister   . Diabetes Sister   . Hypertension Sister   . Diabetes type II Brother   . Kidney failure Brother   . Diabetes Brother   . Hypertension  Brother   . Diabetes type II Sister     SOCIAL HISTORY: Social History   Social History  . Marital status: Married    Spouse name: N/A  . Number of children: 2  . Years of education: N/A   Occupational History  . Disabled    Social History Main Topics  . Smoking status: Former Smoker    Packs/day: 1.50    Years: 30.00    Types: Cigars    Quit date: 10/07/2014  . Smokeless tobacco: Never Used  . Alcohol use 21.0 oz/week    35 Cans of beer per week     Comment: 6 of 12 oz beers a day  . Drug use: No  . Sexual activity: Not on file   Other Topics Concern  . Not on file   Social History Narrative  . No narrative on file    Allergies  Allergen Reactions  . Sulfa Antibiotics Rash    Current Outpatient Prescriptions  Medication Sig Dispense Refill  . ACCU-CHEK FASTCLIX LANCETS MISC Check blood sugar TID & QHS 102 each 2  . amLODipine (NORVASC) 10 MG  tablet   1  . apixaban (ELIQUIS) 5 MG TABS tablet Take 1 tablet (5 mg total) by mouth 2 (two) times daily. 180 tablet 3  . aspirin 81 MG chewable tablet Chew by mouth daily.    Marland Kitchen atorvastatin (LIPITOR) 20 MG tablet Take 20 tablets by mouth daily.  2  . blood glucose meter kit and supplies KIT Check blood sugar TID & QHS 1 each 0  . Blood Glucose Monitoring Suppl (ACCU-CHEK ADVANTAGE DIABETES) kit Use as instructed 1 each 0  . chlorthalidone (HYGROTON) 50 MG tablet Take 50 mg by mouth daily.    Marland Kitchen glucose blood (ACCU-CHEK AVIVA PLUS) test strip Use as instructed 100 each 12  . glucose blood (CHOICE DM FORA G20 TEST STRIPS) test strip Check blood sugar TID & QHS 100 each 12  . glucose blood test strip Use as instructed 100 each 12  . hydrALAZINE (APRESOLINE) 100 MG tablet Take 100 mg by mouth 3 (three) times daily.    . Insulin NPH Isophane & Regular (NOVOLIN 70/30 [place]) Inject 56 Units/day into the skin every morning. 46 units at night    . lisinopril (PRINIVIL,ZESTRIL) 10 MG tablet Take 10 mg by mouth daily.   0  .  metoprolol (LOPRESSOR) 100 MG tablet Take 100 mg by mouth 2 (two) times daily.    . Multiple Vitamin (MULTIVITAMIN WITH MINERALS) TABS tablet Take 1 tablet by mouth daily.    . prazosin (MINIPRESS) 2 MG capsule   0  . amiodarone (PACERONE) 400 MG tablet Take 0.5 tablets (200 mg total) by mouth daily. (Patient not taking: Reported on 09/04/2017) 60 tablet 0  . cilostazol (PLETAL) 100 MG tablet Take 1 tablet (100 mg total) by mouth 2 (two) times daily before a meal. (Patient not taking: Reported on 09/04/2017) 60 tablet 11  . Insulin Lispro Prot & Lispro (HUMALOG MIX 75/25 KWIKPEN) (75-25) 100 UNIT/ML Kwikpen Inject 46-56 Units into the skin 2 (two) times daily with a meal. Inject 56units subcutaneously before breakfast and 46units before supper    . prednisoLONE acetate (PRED FORTE) 1 % ophthalmic suspension Place 1 drop into the left eye 4 (four) times daily.  0   No current facility-administered medications for this visit.     ROS:   General:  No weight loss, Fever, chills  HEENT: No recent headaches, no nasal bleeding, no visual changes, no sore throat  Neurologic: No dizziness, blackouts, seizures. No recent symptoms of stroke or mini- stroke. No recent episodes of slurred speech, or temporary blindness.  Cardiac: No recent episodes of chest pain/pressure, no shortness of breath at rest.  No shortness of breath with exertion.  Denies history of atrial fibrillation or irregular heartbeat  Vascular: Positive right LE rest pain in feet.  Positive history of claudication.  No history of non-healing ulcer, No history of DVT   Pulmonary: No home oxygen, no productive cough, no hemoptysis,  No asthma or wheezing  Musculoskeletal:  _0  Arthritis, _1  Low back pain,  _2  Joint pain  Hematologic:No history of hypercoagulable state.  No history of easy bleeding.  Positive history of anemia  Gastrointestinal: No hematochezia or melena,  No gastroesophageal reflux, no trouble  swallowing  Urinary: [ x] chronic Kidney disease, _3  on HD - _4  MWF or _5  TTHS, _6  Burning with urination, _7  Frequent urination, _8  Difficulty urinating;   Skin: No rashes, non healing ulcer right foot between 4th and 5th digits  Psychological:  No history of anxiety,  No history of depression   Physical Examination  Vitals:   09/04/17 1336 09/04/17 1340  BP: (!) 157/72 (!) 159/73  Pulse: 67   Resp: 20   Temp: 98.4 F (36.9 C)   TempSrc: Oral   SpO2: 96%   Weight: 268 lb (121.6 kg)   Height: 6' (1.829 m)     Body mass index is 36.35 kg/m.  General:  Alert and oriented, no acute distress HEENT: Normal Neck: No bruit or JVD Pulmonary: Clear to auscultation bilaterally Cardiac: Regular Rate and Rhythm without murmur Abdomen: Soft, non-tender, non-distended, no mass, no scars Skin: No rash Extremity Pulses:  2+ radial, brachial, femoral pulses bilaterally, not palpable dorsalis pedis, posterior tibial.  Doppler signal monophasic right DP/PT Musculoskeletal: Left transmetatarsal amputation well healed, positive right LE edema  Neurologic: Upper and lower extremity motor 5/5 and grossly symmetric  DATA:  Vein mapping in progress today right LE Right LE saphenous vein mapping 09/04/2017 0.82 Femoral junction to 0.28 distal calf.  CT angiogram of the right leg was performed which showed popliteal artery occlusion.  08/08/2017  ASSESSMENT:   Right popliteal occlusion Claudication , non healing wound right foot and rest pain.  PLAN:   We will get right LE vein mapping today appears acceptable  Monday 10 29 he will be scheduled for angiogram with CO2 due to CKD by Dr. Scot Dock.  He plans to use a right femoral artery as his stick site, not the left to prevent injury to the by pass on the left LE.  He was told to hold his Eliquis for 48 hours prior to the angiogram.  Pending results he will be scheduled for right LE by pass on Tuesday 09/10/2017 by Dr. Scot Dock.   Continue  maximum medical management with his statin, 81 mg aspirin, and BP control.  Dry guaze between the toes where the non healing wound is.   COLLINS, EMMA MAUREEN PA-C [facility] [place]:   I have interviewed the patient and examined the patient. I agree with the findings by the PA This patient is well-known to me.  He underwent a left femoral to tibial peroneal trunk bypass with vein in 2014 for gangrenous wound on the left foot.  He ultimately healed his toe amputations.  He now presents with a wound between the fourth and fifth toes on the right foot.  He has had 2 attempts at an endovascular approach to his infrainguinal arterial occlusive disease.  On 11/08/2015, he had atherectomy of the right popliteal artery and drug-coated balloon angioplasty of the right popliteal artery.  He subsequently presented with rest pain of the right foot and was found to have a tight stenosis of the popliteal artery.  On 10/10/2016 he underwent jetstream atherectomy of the right superficial femoral artery and popliteal arteries, drug-coated balloon angioplasty of the right popliteal artery, and drug-coated balloon angioplasty of the right superficial femoral artery.  He was seen by Dr. Trula Slade in the office on 08/12/2017.  At that time he was having right leg pain but did not have any open ulcers on his foot.  A CT angiogram of his right leg that was performed at that time which showed a popliteal artery occlusion.  However, given that he was only having claudication at the time he was set up for a follow-up visit with me in 6 weeks.  He now has a wound of the right foot and clearly  a limb threatening situation given his infrainguinal arterial occlusive disease and diabetes with a nonhealing wound of the foot.  The situation is further complicated by his renal insufficiency.  08/08/2017 his creatinine was 2.16 with a GFR of 32.   I have recommended that we  proceed with arteriography that  need to be done with CO2 and possibly very limited contrast.  I have tentatively scheduled him for a bypass the following day as we will be holding his Eliquis 48 hours prior to his arteriogram.  His arteriogram is scheduled for 09/09/2017 with plans for a bypass on 09/10/2017.  The schedule was shifted around so that Dr. Donzetta Matters will be performing the procedure.  Certainly if he feels that there is an endovascular approach that I think this would be reasonable.  We will make further recommendations pending the results of his arteriogram.  Gae Gallop, MD [phone]

## 2017-09-09 NOTE — Progress Notes (Signed)
Site area: rt groin fa sheath Site Prior to Removal:  Level 0 Pressure Applied For:  20 minutes Manual:   yes Patient Status During Pull:  stable Post Pull Site:  Level  0 Post Pull Instructions Given:  yes Post Pull Pulses Present: dopplered rt peroneal Dressing Applied:  Gauze and tegaderm Bedrest begins @ 1310 Comments:

## 2017-09-10 ENCOUNTER — Inpatient Hospital Stay (HOSPITAL_COMMUNITY): Admission: RE | Admit: 2017-09-10 | Payer: Medicare Other | Source: Ambulatory Visit | Admitting: Vascular Surgery

## 2017-09-10 ENCOUNTER — Inpatient Hospital Stay (HOSPITAL_COMMUNITY): Payer: Medicare Other | Admitting: Certified Registered Nurse Anesthetist

## 2017-09-10 ENCOUNTER — Inpatient Hospital Stay (HOSPITAL_COMMUNITY): Payer: Medicare Other

## 2017-09-10 ENCOUNTER — Encounter (HOSPITAL_COMMUNITY)
Admission: AD | Disposition: A | Discharge status: Home Health | Payer: Self-pay | Source: Ambulatory Visit | Attending: Vascular Surgery

## 2017-09-10 ENCOUNTER — Encounter (HOSPITAL_COMMUNITY): Payer: Self-pay | Admitting: Vascular Surgery

## 2017-09-10 HISTORY — PX: AMPUTATION: SHX166

## 2017-09-10 HISTORY — PX: FEMORAL-TIBIAL BYPASS GRAFT: SHX938

## 2017-09-10 HISTORY — PX: INTRAOPERATIVE ARTERIOGRAM: SHX5157

## 2017-09-10 LAB — POCT I-STAT 4, (NA,K, GLUC, HGB,HCT)
Glucose, Bld: 189 mg/dL — ABNORMAL HIGH (ref 65–99)
HCT: 26 % — ABNORMAL LOW (ref 39.0–52.0)
Hemoglobin: 8.8 g/dL — ABNORMAL LOW (ref 13.0–17.0)
POTASSIUM: 4.5 mmol/L (ref 3.5–5.1)
SODIUM: 141 mmol/L (ref 135–145)

## 2017-09-10 LAB — APTT: aPTT: 51 seconds — ABNORMAL HIGH (ref 24–36)

## 2017-09-10 LAB — GLUCOSE, CAPILLARY
GLUCOSE-CAPILLARY: 236 mg/dL — AB (ref 65–99)
GLUCOSE-CAPILLARY: 200 mg/dL — AB (ref 65–99)
GLUCOSE-CAPILLARY: 208 mg/dL — AB (ref 65–99)
Glucose-Capillary: 240 mg/dL — ABNORMAL HIGH (ref 65–99)

## 2017-09-10 LAB — HEMOGLOBIN A1C
Hgb A1c MFr Bld: 6.5 % — ABNORMAL HIGH (ref 4.8–5.6)
Mean Plasma Glucose: 139.85 mg/dL

## 2017-09-10 LAB — HEPARIN LEVEL (UNFRACTIONATED): Heparin Unfractionated: 0.31 IU/mL (ref 0.30–0.70)

## 2017-09-10 SURGERY — CREATION, BYPASS, ARTERIAL, FEMORAL TO TIBIAL, USING GRAFT
Anesthesia: General | Site: Toe | Laterality: Right

## 2017-09-10 MED ORDER — POTASSIUM CHLORIDE IN NACL 20-0.9 MEQ/L-% IV SOLN
INTRAVENOUS | Status: DC
  Administered 2017-09-10: 16:00:00 via INTRAVENOUS
  Filled 2017-09-10 (×2): qty 1000

## 2017-09-10 MED ORDER — LIDOCAINE 2% (20 MG/ML) 5 ML SYRINGE
INTRAMUSCULAR | Status: AC
  Filled 2017-09-10: qty 5

## 2017-09-10 MED ORDER — MEPERIDINE HCL 25 MG/ML IJ SOLN
6.2500 mg | INTRAMUSCULAR | Status: DC | PRN
Start: 2017-09-10 — End: 2017-09-10

## 2017-09-10 MED ORDER — HYDROMORPHONE HCL 1 MG/ML IJ SOLN: 0.2500 mg | INTRAMUSCULAR | Status: DC | PRN

## 2017-09-10 MED ORDER — ASPIRIN 81 MG PO CHEW
81.0000 mg | CHEWABLE_TABLET | Freq: Every day | ORAL | Status: DC
  Administered 2017-09-10 – 2017-09-16 (×6): 81 mg via ORAL
  Filled 2017-09-10 (×6): qty 1

## 2017-09-10 MED ORDER — PROPOFOL 10 MG/ML IV BOLUS
INTRAVENOUS | Status: AC
  Filled 2017-09-10: qty 20

## 2017-09-10 MED ORDER — OXYCODONE-ACETAMINOPHEN 5-325 MG PO TABS
1.0000 | ORAL_TABLET | ORAL | Status: DC | PRN
  Administered 2017-09-11 – 2017-09-15 (×12): 2 via ORAL
  Filled 2017-09-10 (×12): qty 2

## 2017-09-10 MED ORDER — LABETALOL HCL 5 MG/ML IV SOLN: 10.0000 mg | INTRAVENOUS | Status: DC | PRN

## 2017-09-10 MED ORDER — ARTIFICIAL TEARS OPHTHALMIC OINT
TOPICAL_OINTMENT | OPHTHALMIC | Status: DC | PRN
Start: 2017-09-10 — End: 2017-09-10
  Administered 2017-09-10: 1 via OPHTHALMIC

## 2017-09-10 MED ORDER — IODIXANOL 320 MG/ML IV SOLN
INTRAVENOUS | Status: DC | PRN
  Administered 2017-09-10: 35 mL via INTRAVENOUS

## 2017-09-10 MED ORDER — BACITRACIN ZINC 500 UNIT/GM EX OINT
TOPICAL_OINTMENT | CUTANEOUS | Status: DC | PRN
  Administered 2017-09-10: 1 via TOPICAL

## 2017-09-10 MED ORDER — PRAZOSIN HCL 2 MG PO CAPS
2.0000 mg | ORAL_CAPSULE | Freq: Every evening | ORAL | Status: DC
  Administered 2017-09-10 – 2017-09-15 (×5): 2 mg via ORAL
  Filled 2017-09-10 (×7): qty 1

## 2017-09-10 MED ORDER — ALBUTEROL SULFATE HFA 108 (90 BASE) MCG/ACT IN AERS
INHALATION_SPRAY | RESPIRATORY_TRACT | Status: DC | PRN
  Administered 2017-09-10: 2 via RESPIRATORY_TRACT

## 2017-09-10 MED ORDER — PAPAVERINE HCL 30 MG/ML IJ SOLN
INTRAMUSCULAR | Status: AC
  Filled 2017-09-10: qty 2

## 2017-09-10 MED ORDER — METOPROLOL TARTRATE 100 MG PO TABS
100.0000 mg | ORAL_TABLET | Freq: Two times a day (BID) | ORAL | Status: DC
  Administered 2017-09-10 – 2017-09-16 (×12): 100 mg via ORAL
  Filled 2017-09-10 (×12): qty 1

## 2017-09-10 MED ORDER — HYDRALAZINE HCL 50 MG PO TABS
100.0000 mg | ORAL_TABLET | Freq: Three times a day (TID) | ORAL | Status: DC
  Administered 2017-09-10 – 2017-09-16 (×16): 100 mg via ORAL
  Filled 2017-09-10 (×17): qty 2

## 2017-09-10 MED ORDER — ARTIFICIAL TEARS OPHTHALMIC OINT
TOPICAL_OINTMENT | OPHTHALMIC | Status: AC
  Filled 2017-09-10: qty 3.5

## 2017-09-10 MED ORDER — PHENOL 1.4 % MT LIQD: 1.0000 | OROMUCOSAL | Status: DC | PRN

## 2017-09-10 MED ORDER — MORPHINE SULFATE (PF) 2 MG/ML IV SOLN: 2.0000 mg | INTRAVENOUS | Status: DC | PRN

## 2017-09-10 MED ORDER — DOCUSATE SODIUM 100 MG PO CAPS
100.0000 mg | ORAL_CAPSULE | Freq: Every day | ORAL | Status: DC
  Administered 2017-09-11 – 2017-09-16 (×4): 100 mg via ORAL
  Filled 2017-09-10 (×5): qty 1

## 2017-09-10 MED ORDER — GLYCOPYRROLATE 0.2 MG/ML IJ SOLN
INTRAMUSCULAR | Status: DC | PRN
  Administered 2017-09-10: 0.1 mg via INTRAVENOUS

## 2017-09-10 MED ORDER — LISINOPRIL 10 MG PO TABS
10.0000 mg | ORAL_TABLET | Freq: Every day | ORAL | Status: DC
  Administered 2017-09-10 – 2017-09-16 (×7): 10 mg via ORAL
  Filled 2017-09-10 (×7): qty 1

## 2017-09-10 MED ORDER — PROTAMINE SULFATE 10 MG/ML IV SOLN
INTRAVENOUS | Status: DC | PRN
  Administered 2017-09-10: 40 mg via INTRAVENOUS

## 2017-09-10 MED ORDER — MAGNESIUM SULFATE 2 GM/50ML IV SOLN
2.0000 g | Freq: Every day | INTRAVENOUS | Status: DC | PRN
  Filled 2017-09-10: qty 50

## 2017-09-10 MED ORDER — PIPERACILLIN-TAZOBACTAM 3.375 G IVPB
3.3750 g | Freq: Three times a day (TID) | INTRAVENOUS | Status: DC
  Administered 2017-09-10 – 2017-09-12 (×6): 3.375 g via INTRAVENOUS
  Filled 2017-09-10 (×7): qty 50

## 2017-09-10 MED ORDER — FENTANYL CITRATE (PF) 250 MCG/5ML IJ SOLN
INTRAMUSCULAR | Status: DC | PRN
  Administered 2017-09-10 (×2): 25 ug via INTRAVENOUS
  Administered 2017-09-10: 150 ug via INTRAVENOUS
  Administered 2017-09-10: 50 ug via INTRAVENOUS

## 2017-09-10 MED ORDER — LACTATED RINGERS IV SOLN
INTRAVENOUS | Status: DC | PRN
  Administered 2017-09-10 (×3): via INTRAVENOUS

## 2017-09-10 MED ORDER — ALBUTEROL SULFATE HFA 108 (90 BASE) MCG/ACT IN AERS
INHALATION_SPRAY | RESPIRATORY_TRACT | Status: AC
Start: 2017-09-10 — End: 2017-09-10
  Filled 2017-09-10: qty 6.7

## 2017-09-10 MED ORDER — CEFUROXIME SODIUM 1.5 G IV SOLR
1.5000 g | Freq: Two times a day (BID) | INTRAVENOUS | Status: DC
  Filled 2017-09-10: qty 1.5

## 2017-09-10 MED ORDER — ALBUMIN HUMAN 5 % IV SOLN
INTRAVENOUS | Status: DC | PRN
  Administered 2017-09-10 (×2): via INTRAVENOUS

## 2017-09-10 MED ORDER — HEPARIN (PORCINE) IN NACL 100-0.45 UNIT/ML-% IJ SOLN
1450.0000 [IU]/h | INTRAMUSCULAR | Status: DC
  Administered 2017-09-10 – 2017-09-11 (×2): 1450 [IU]/h via INTRAVENOUS
  Filled 2017-09-10 (×3): qty 250

## 2017-09-10 MED ORDER — PROTAMINE SULFATE 10 MG/ML IV SOLN
INTRAVENOUS | Status: AC
  Filled 2017-09-10: qty 5

## 2017-09-10 MED ORDER — ONDANSETRON HCL 4 MG/2ML IJ SOLN: 4.0000 mg | Freq: Once | INTRAMUSCULAR | Status: DC | PRN

## 2017-09-10 MED ORDER — INSULIN ASPART 100 UNIT/ML ~~LOC~~ SOLN
0.0000 [IU] | Freq: Three times a day (TID) | SUBCUTANEOUS | Status: DC
  Administered 2017-09-10: 5 [IU] via SUBCUTANEOUS
  Administered 2017-09-11: 8 [IU] via SUBCUTANEOUS
  Administered 2017-09-11: 5 [IU] via SUBCUTANEOUS
  Administered 2017-09-11: 8 [IU] via SUBCUTANEOUS
  Administered 2017-09-12: 5 [IU] via SUBCUTANEOUS
  Administered 2017-09-13: 11 [IU] via SUBCUTANEOUS
  Administered 2017-09-13 (×2): 8 [IU] via SUBCUTANEOUS
  Administered 2017-09-14 (×2): 5 [IU] via SUBCUTANEOUS
  Administered 2017-09-14: 8 [IU] via SUBCUTANEOUS
  Administered 2017-09-15: 5 [IU] via SUBCUTANEOUS
  Administered 2017-09-15: 11 [IU] via SUBCUTANEOUS
  Administered 2017-09-15 – 2017-09-16 (×3): 5 [IU] via SUBCUTANEOUS

## 2017-09-10 MED ORDER — HEPARIN SODIUM (PORCINE) 1000 UNIT/ML IJ SOLN
INTRAMUSCULAR | Status: DC | PRN
  Administered 2017-09-10: 10000 [IU] via INTRAVENOUS

## 2017-09-10 MED ORDER — CHLORTHALIDONE 50 MG PO TABS
50.0000 mg | ORAL_TABLET | Freq: Every day | ORAL | Status: DC
  Administered 2017-09-10 – 2017-09-16 (×6): 50 mg via ORAL
  Filled 2017-09-10 (×7): qty 1

## 2017-09-10 MED ORDER — SODIUM CHLORIDE 0.9 % IV SOLN: 500.0000 mL | Freq: Once | INTRAVENOUS | Status: DC | PRN

## 2017-09-10 MED ORDER — FENTANYL CITRATE (PF) 250 MCG/5ML IJ SOLN
INTRAMUSCULAR | Status: AC
  Filled 2017-09-10: qty 5

## 2017-09-10 MED ORDER — PROPOFOL 10 MG/ML IV BOLUS
INTRAVENOUS | Status: DC | PRN
  Administered 2017-09-10: 150 mg via INTRAVENOUS

## 2017-09-10 MED ORDER — AMLODIPINE BESYLATE 10 MG PO TABS
10.0000 mg | ORAL_TABLET | Freq: Every day | ORAL | Status: DC
  Administered 2017-09-10 – 2017-09-16 (×7): 10 mg via ORAL
  Filled 2017-09-10 (×7): qty 1

## 2017-09-10 MED ORDER — ROCURONIUM BROMIDE 100 MG/10ML IV SOLN
INTRAVENOUS | Status: DC | PRN
  Administered 2017-09-10 (×3): 50 mg via INTRAVENOUS

## 2017-09-10 MED ORDER — PANTOPRAZOLE SODIUM 40 MG PO TBEC
40.0000 mg | DELAYED_RELEASE_TABLET | Freq: Every day | ORAL | Status: DC
  Administered 2017-09-11 – 2017-09-16 (×5): 40 mg via ORAL
  Filled 2017-09-10 (×5): qty 1

## 2017-09-10 MED ORDER — METOPROLOL TARTRATE 5 MG/5ML IV SOLN: 2.0000 mg | INTRAVENOUS | Status: DC | PRN

## 2017-09-10 MED ORDER — 0.9 % SODIUM CHLORIDE (POUR BTL) OPTIME
TOPICAL | Status: DC | PRN
  Administered 2017-09-10 (×2): 1000 mL

## 2017-09-10 MED ORDER — MIDAZOLAM HCL 2 MG/2ML IJ SOLN
INTRAMUSCULAR | Status: DC | PRN
  Administered 2017-09-10 (×2): 1 mg via INTRAVENOUS

## 2017-09-10 MED ORDER — ROCURONIUM BROMIDE 10 MG/ML (PF) SYRINGE
PREFILLED_SYRINGE | INTRAVENOUS | Status: AC
Start: 2017-09-10 — End: 2017-09-10
  Filled 2017-09-10: qty 5

## 2017-09-10 MED ORDER — CEFAZOLIN SODIUM-DEXTROSE 2-3 GM-%(50ML) IV SOLR
INTRAVENOUS | Status: DC | PRN
  Administered 2017-09-10: 2 g via INTRAVENOUS

## 2017-09-10 MED ORDER — ONDANSETRON HCL 4 MG/2ML IJ SOLN: 4.0000 mg | Freq: Four times a day (QID) | INTRAMUSCULAR | Status: DC | PRN

## 2017-09-10 MED ORDER — GUAIFENESIN-DM 100-10 MG/5ML PO SYRP: 15.0000 mL | ORAL_SOLUTION | ORAL | Status: DC | PRN

## 2017-09-10 MED ORDER — ONDANSETRON HCL 4 MG/2ML IJ SOLN
INTRAMUSCULAR | Status: DC | PRN
  Administered 2017-09-10: 4 mg via INTRAVENOUS

## 2017-09-10 MED ORDER — BACITRACIN ZINC 500 UNIT/GM EX OINT
TOPICAL_OINTMENT | CUTANEOUS | Status: AC
  Filled 2017-09-10: qty 28.35

## 2017-09-10 MED ORDER — SODIUM CHLORIDE 0.9 % IV SOLN
INTRAVENOUS | Status: DC | PRN
  Administered 2017-09-10: 500 mL

## 2017-09-10 MED ORDER — CILOSTAZOL 100 MG PO TABS
100.0000 mg | ORAL_TABLET | Freq: Two times a day (BID) | ORAL | Status: DC
  Administered 2017-09-15: 100 mg via ORAL
  Filled 2017-09-10 (×13): qty 1

## 2017-09-10 MED ORDER — POTASSIUM CHLORIDE CRYS ER 20 MEQ PO TBCR: 20.0000 meq | EXTENDED_RELEASE_TABLET | Freq: Every day | ORAL | Status: DC | PRN

## 2017-09-10 MED ORDER — SODIUM CHLORIDE 0.9 % IV SOLN
INTRAVENOUS | Status: DC | PRN
  Administered 2017-09-10: 50 ug/min via INTRAVENOUS

## 2017-09-10 MED ORDER — ADULT MULTIVITAMIN W/MINERALS CH
1.0000 | ORAL_TABLET | Freq: Every day | ORAL | Status: DC
  Administered 2017-09-11 – 2017-09-16 (×5): 1 via ORAL
  Filled 2017-09-10 (×5): qty 1

## 2017-09-10 MED ORDER — SUGAMMADEX SODIUM 200 MG/2ML IV SOLN
INTRAVENOUS | Status: AC
  Filled 2017-09-10: qty 2

## 2017-09-10 MED ORDER — ROCURONIUM BROMIDE 10 MG/ML (PF) SYRINGE
PREFILLED_SYRINGE | INTRAVENOUS | Status: AC
  Filled 2017-09-10: qty 5

## 2017-09-10 MED ORDER — ALUM & MAG HYDROXIDE-SIMETH 200-200-20 MG/5ML PO SUSP: 15.0000 mL | ORAL | Status: DC | PRN

## 2017-09-10 MED ORDER — ONDANSETRON HCL 4 MG/2ML IJ SOLN
INTRAMUSCULAR | Status: AC
  Filled 2017-09-10: qty 2

## 2017-09-10 MED ORDER — SUGAMMADEX SODIUM 200 MG/2ML IV SOLN
INTRAVENOUS | Status: DC | PRN
  Administered 2017-09-10: 200 mg via INTRAVENOUS

## 2017-09-10 MED ORDER — ACETAMINOPHEN 325 MG PO TABS: 325.0000 mg | ORAL_TABLET | ORAL | Status: DC | PRN

## 2017-09-10 MED ORDER — PAPAVERINE HCL 30 MG/ML IJ SOLN
INTRAMUSCULAR | Status: DC | PRN
  Administered 2017-09-10: 60 mg

## 2017-09-10 MED ORDER — MIDAZOLAM HCL 2 MG/2ML IJ SOLN
INTRAMUSCULAR | Status: AC
  Filled 2017-09-10: qty 2

## 2017-09-10 MED ORDER — ATORVASTATIN CALCIUM 40 MG PO TABS
40.0000 mg | ORAL_TABLET | Freq: Every day | ORAL | Status: DC
  Administered 2017-09-10 – 2017-09-16 (×6): 40 mg via ORAL
  Filled 2017-09-10 (×6): qty 1

## 2017-09-10 MED ORDER — HEPARIN SODIUM (PORCINE) 1000 UNIT/ML IJ SOLN
INTRAMUSCULAR | Status: AC
  Filled 2017-09-10: qty 1

## 2017-09-10 MED ORDER — LIDOCAINE HCL (CARDIAC) 20 MG/ML IV SOLN
INTRAVENOUS | Status: DC | PRN
  Administered 2017-09-10: 100 mg via INTRATRACHEAL

## 2017-09-10 MED ORDER — HYDRALAZINE HCL 20 MG/ML IJ SOLN
5.0000 mg | INTRAMUSCULAR | Status: DC | PRN
  Administered 2017-09-10: 5 mg via INTRAVENOUS
  Filled 2017-09-10: qty 1

## 2017-09-10 MED ORDER — ACETAMINOPHEN 650 MG RE SUPP: 325.0000 mg | RECTAL | Status: DC | PRN

## 2017-09-10 MED FILL — Lidocaine HCl Local Inj 2%: INTRAMUSCULAR | Qty: 20 | Status: AC

## 2017-09-10 MED FILL — Heparin Sodium (Porcine) 2 Unit/ML in Sodium Chloride 0.9%: INTRAMUSCULAR | Qty: 500 | Status: AC

## 2017-09-10 SURGICAL SUPPLY — 75 items
BANDAGE ACE 4X5 VEL STRL LF (GAUZE/BANDAGES/DRESSINGS) ×6 IMPLANT
BANDAGE ACE 6X5 VEL STRL LF (GAUZE/BANDAGES/DRESSINGS) ×3 IMPLANT
BANDAGE ESMARK 6X9 LF (GAUZE/BANDAGES/DRESSINGS) ×2 IMPLANT
BIOPATCH RED 1 DISK 7.0 (GAUZE/BANDAGES/DRESSINGS) ×6 IMPLANT
BLADE AVERAGE 25X9 (BLADE) IMPLANT
BNDG ESMARK 6X9 LF (GAUZE/BANDAGES/DRESSINGS) ×3
BNDG GAUZE ELAST 4 BULKY (GAUZE/BANDAGES/DRESSINGS) ×3 IMPLANT
CANISTER SUCT 3000ML PPV (MISCELLANEOUS) ×3 IMPLANT
CANNULA VESSEL 3MM 2 BLNT TIP (CANNULA) ×6 IMPLANT
CLIP VESOCCLUDE MED 24/CT (CLIP) ×3 IMPLANT
CLIP VESOCCLUDE SM WIDE 24/CT (CLIP) ×6 IMPLANT
COVER MAYO STAND STRL (DRAPES) ×3 IMPLANT
COVER SURGICAL LIGHT HANDLE (MISCELLANEOUS) IMPLANT
CUFF TOURNIQUET SINGLE 18IN (TOURNIQUET CUFF) IMPLANT
CUFF TOURNIQUET SINGLE 24IN (TOURNIQUET CUFF) IMPLANT
CUFF TOURNIQUET SINGLE 34IN LL (TOURNIQUET CUFF) ×6 IMPLANT
CUFF TOURNIQUET SINGLE 44IN (TOURNIQUET CUFF) IMPLANT
DERMABOND ADVANCED (GAUZE/BANDAGES/DRESSINGS) ×2
DERMABOND ADVANCED .7 DNX12 (GAUZE/BANDAGES/DRESSINGS) ×4 IMPLANT
DRAIN CHANNEL 15F RND FF W/TCR (WOUND CARE) ×9 IMPLANT
DRAPE EXTREMITY T 121X128X90 (DRAPE) IMPLANT
DRAPE HALF SHEET 40X57 (DRAPES) ×3 IMPLANT
DRAPE X-RAY CASS 24X20 (DRAPES) ×3 IMPLANT
DRSG COVADERM 4X14 (GAUZE/BANDAGES/DRESSINGS) ×3 IMPLANT
DRSG COVADERM 4X8 (GAUZE/BANDAGES/DRESSINGS) ×3 IMPLANT
DRSG TEGADERM 2-3/8X2-3/4 SM (GAUZE/BANDAGES/DRESSINGS) ×3 IMPLANT
DRSG TEGADERM 4X4.75 (GAUZE/BANDAGES/DRESSINGS) ×3 IMPLANT
ELECT CAUTERY BLADE 6.4 (BLADE) ×3 IMPLANT
ELECT REM PT RETURN 9FT ADLT (ELECTROSURGICAL) ×3
ELECTRODE REM PT RTRN 9FT ADLT (ELECTROSURGICAL) ×2 IMPLANT
EVACUATOR SILICONE 100CC (DRAIN) ×9 IMPLANT
GAUZE SPONGE 4X4 12PLY STRL (GAUZE/BANDAGES/DRESSINGS) IMPLANT
GAUZE SPONGE 4X4 12PLY STRL LF (GAUZE/BANDAGES/DRESSINGS) ×9 IMPLANT
GLOVE BIO SURGEON STRL SZ7.5 (GLOVE) ×3 IMPLANT
GLOVE BIOGEL PI IND STRL 6.5 (GLOVE) ×6 IMPLANT
GLOVE BIOGEL PI IND STRL 8 (GLOVE) ×2 IMPLANT
GLOVE BIOGEL PI IND STRL 8.5 (GLOVE) ×2 IMPLANT
GLOVE BIOGEL PI INDICATOR 6.5 (GLOVE) ×3
GLOVE BIOGEL PI INDICATOR 8 (GLOVE) ×1
GLOVE BIOGEL PI INDICATOR 8.5 (GLOVE) ×1
GLOVE SS BIOGEL STRL SZ 8 (GLOVE) ×2 IMPLANT
GLOVE SUPERSENSE BIOGEL SZ 8 (GLOVE) ×1
GLOVE SURG SS PI 7.5 STRL IVOR (GLOVE) ×3 IMPLANT
GOWN STRL REUS W/ TWL LRG LVL3 (GOWN DISPOSABLE) ×6 IMPLANT
GOWN STRL REUS W/ TWL XL LVL3 (GOWN DISPOSABLE) ×2 IMPLANT
GOWN STRL REUS W/TWL LRG LVL3 (GOWN DISPOSABLE) ×3
GOWN STRL REUS W/TWL XL LVL3 (GOWN DISPOSABLE) ×1
KIT BASIN OR (CUSTOM PROCEDURE TRAY) ×3 IMPLANT
KIT ROOM TURNOVER OR (KITS) ×3 IMPLANT
MARKER GRAFT CORONARY BYPASS (MISCELLANEOUS) ×3 IMPLANT
NS IRRIG 1000ML POUR BTL (IV SOLUTION) ×6 IMPLANT
PACK GENERAL/GYN (CUSTOM PROCEDURE TRAY) IMPLANT
PACK PERIPHERAL VASCULAR (CUSTOM PROCEDURE TRAY) ×3 IMPLANT
PAD ARMBOARD 7.5X6 YLW CONV (MISCELLANEOUS) ×6 IMPLANT
PENCIL BUTTON HOLSTER BLD 10FT (ELECTRODE) ×3 IMPLANT
SET COLLECT BLD 21X3/4 12 (NEEDLE) ×3 IMPLANT
SPONGE SURGIFOAM ABS GEL 100 (HEMOSTASIS) IMPLANT
STOPCOCK 4 WAY LG BORE MALE ST (IV SETS) ×3 IMPLANT
SUT ETHILON 3 0 PS 1 (SUTURE) ×12 IMPLANT
SUT PROLENE 5 0 C 1 24 (SUTURE) ×3 IMPLANT
SUT PROLENE 6 0 BV (SUTURE) ×18 IMPLANT
SUT SILK 2 0 PERMA HAND 18 BK (SUTURE) IMPLANT
SUT SILK 2 0 SH (SUTURE) ×3 IMPLANT
SUT SILK 3 0 (SUTURE) ×2
SUT SILK 3-0 18XBRD TIE 12 (SUTURE) ×4 IMPLANT
SUT VIC AB 2-0 CTB1 (SUTURE) ×6 IMPLANT
SUT VIC AB 3-0 SH 27 (SUTURE) ×7
SUT VIC AB 3-0 SH 27X BRD (SUTURE) ×14 IMPLANT
SUT VIC AB 4-0 PS2 18 (SUTURE) ×3 IMPLANT
SUT VICRYL 4-0 PS2 18IN ABS (SUTURE) ×6 IMPLANT
TOWEL GREEN STERILE (TOWEL DISPOSABLE) ×3 IMPLANT
TRAY FOLEY W/METER SILVER 16FR (SET/KITS/TRAYS/PACK) ×3 IMPLANT
TUBING EXTENTION W/L.L. (IV SETS) ×3 IMPLANT
UNDERPAD 30X30 (UNDERPADS AND DIAPERS) ×3 IMPLANT
WATER STERILE IRR 1000ML POUR (IV SOLUTION) ×3 IMPLANT

## 2017-09-10 NOTE — Anesthesia Preprocedure Evaluation (Signed)
Anesthesia Evaluation  Patient identified by MRN, date of birth, ID band Patient awake    Reviewed: Allergy & Precautions, NPO status , Patient's Chart, lab work & pertinent test results  Airway Mallampati: I  TM Distance: >3 FB Neck ROM: Full    Dental   Pulmonary former smoker,    Pulmonary exam normal        Cardiovascular hypertension, Pt. on medications + CAD and + Cardiac Stents  Normal cardiovascular exam+ pacemaker      Neuro/Psych    GI/Hepatic   Endo/Other  diabetes, Type 2, Insulin Dependent  Renal/GU Renal InsufficiencyRenal disease     Musculoskeletal   Abdominal   Peds  Hematology   Anesthesia Other Findings   Reproductive/Obstetrics                             Anesthesia Physical Anesthesia Plan  ASA: III  Anesthesia Plan: General   Post-op Pain Management:    Induction: Intravenous  PONV Risk Score and Plan: 2 and Ondansetron and Treatment may vary due to age or medical condition  Airway Management Planned: Oral ETT  Additional Equipment:   Intra-op Plan:   Post-operative Plan: Extubation in OR  Informed Consent: I have reviewed the patients History and Physical, chart, labs and discussed the procedure including the risks, benefits and alternatives for the proposed anesthesia with the patient or authorized representative who has indicated his/her understanding and acceptance.     Plan Discussed with: CRNA and Surgeon  Anesthesia Plan Comments:         Anesthesia Quick Evaluation

## 2017-09-10 NOTE — Interval H&P Note (Signed)
History and Physical Interval Note:  09/10/2017 7:22 AM  Alexander [name]  has presented today for surgery, with the diagnosis of PERIPHERAL ARTERY DISEASE GANEGRENE  The various methods of treatment have been discussed with the patient and family. After consideration of risks, benefits and other options for treatment, the patient has consented to  Procedure(s): BYPASS GRAFT FEMORAL-TIBIAL ARTERY RIGHT LOWER EXTREMITY (Right) AMPUTATION RIGHT TOE 4 AND 5 (Right) as a surgical intervention .  The patient's history has been reviewed, patient examined, no change in status, stable for surgery.  I have reviewed the patient's chart and labs.  Questions were answered to the patient's satisfaction.     [name]

## 2017-09-10 NOTE — Progress Notes (Addendum)
Pharmacy Antibiotic Note  Alexander [name] is a 56 y.o. male admitted on 09/09/2017 with nonhealing wound of right foot with gangrene of 4th and 5th toes, s/p amputation (AET 1157 10/30).  Pharmacy has been consulted for zosyn dosing.  WBC is within normal limits. Scr is trending downward (1.6>1.51). Patient is afebrile.   Plan: Discontinue post-op cefuroxime since on concurrent zosyn  Zosyn 3.375g IV q8h (4 hour infusion).  Monitor renal function, WBC, cx results, and length of therapy.  Height: 6' (182.9 cm) Weight: 260 lb (117.9 kg) IBW/kg (Calculated) : 77.6  Temp (24hrs), Avg:98.4 F (36.9 C), Min:98.1 F (36.7 C), Max:98.8 F (37.1 C)   Recent Labs Lab 09/09/17 0946 09/09/17 1642  WBC  --  6.3  CREATININE 1.60* 1.51*    Estimated Creatinine Clearance: 70.7 mL/min (A) (by C-G formula based on SCr of 1.51 mg/dL (H)).    Allergies  Allergen Reactions  . Sulfa Antibiotics Rash    Antimicrobials this admission: Cefazolin 10/30 x1 Zosyn 10/30 >>   Dose adjustments this admission: N/A  Microbiology results: 10/29 MRSA PCR: negative  Thank you for allowing pharmacy to be a part of this patient's care.  Doylene Canard, PharmD Clinical Pharmacist  Pager: [phone] Clinical Phone for 09/10/2017 until 3:30pm: [phone] If after 3:30pm, please call main pharmacy at [phone]

## 2017-09-10 NOTE — Transfer of Care (Signed)
Immediate Anesthesia Transfer of Care Note  Patient: Alexander [name]  Procedure(s) Performed: RIGHT SUPERFICIAL  FEMORAL ARTERY-BELOW KNEE POPLITEAL ARTERY BYPASS GRAFT WITH VEIN (Right Leg Lower) RIGHT FOURTH AND FIFTH TOE AMPUTATION (Right Toe) INTRA OPERATIVE ARTERIOGRAM (Right Leg Lower)  Patient Location: PACU  Anesthesia Type:General  Level of Consciousness: awake, alert  and patient cooperative  Airway & Oxygen Therapy: Patient Spontanous Breathing and Patient connected to nasal cannula oxygen  Post-op Assessment: Report given to RN, Post -op Vital signs reviewed and stable, Patient moving all extremities X 4 and Patient able to stick tongue midline  Post vital signs: Reviewed and stable  Last Vitals:  Vitals:   09/10/17 0505 09/10/17 1156  BP: (!) 143/72   Pulse: 69   Resp: 17   Temp: 37.1 C (P) 36.8 C  SpO2: 95% (P) 94%    Last Pain:  Vitals:   09/10/17 0505  TempSrc: Oral  PainSc:          Complications: No apparent anesthesia complications

## 2017-09-10 NOTE — Progress Notes (Addendum)
ANTICOAGULATION CONSULT NOTE - Initial Consult  Pharmacy Consult for heparin Indication: atrial fibrillation  Allergies  Allergen Reactions  . Sulfa Antibiotics Rash    Patient Measurements: Height: 6' (182.9 cm) Weight: 260 lb (117.9 kg) IBW/kg (Calculated) : 77.6 Heparin Dosing Weight: 103 kg  Vital Signs: Temp: 98.1 F (36.7 C) (10/30 1315) Temp Source: Oral (10/30 0505) BP: 161/58 (10/30 1310) Pulse Rate: 72 (10/30 1315)  Labs:  Recent Labs  09/09/17 0946 09/09/17 1642 09/10/17 0942  HGB 9.9* 10.1* 8.8*  HCT 29.0* 29.9* 26.0*  PLT  --  194  --   APTT  --  55*  --   LABPROT  --  14.9  --   INR  --  1.18  --   CREATININE 1.60* 1.51*  --     Estimated Creatinine Clearance: 70.7 mL/min (A) (by C-G formula based on SCr of 1.51 mg/dL (H)).   Medical History: Past Medical History:  Diagnosis Date  . Alcohol abuse   . Anemia   . Arthritis   . Atrial fibrillation ([place])   . Cardiac syncope 10/07/14   rapid A fib with 8 sec pauses on converison with syncope- temp pacing wire placed then PPM  . Cataract    BILATERAL  . Chronic kidney disease   . Coronary artery disease   . Diabetes mellitus    dx---been  awhile  . Hyperlipemia   . Hypertension   . Peripheral vascular disease ([place])   . Presence of permanent cardiac pacemaker 10/08/2014    Medications:  Scheduled:  . chlorhexidine  60 mL Topical Once  . [START ON 09/11/2017] docusate sodium  100 mg Oral Daily  . insulin aspart  0-15 Units Subcutaneous TID WC  . insulin aspart  0-15 Units Subcutaneous TID WC  . insulin aspart  0-5 Units Subcutaneous QHS  . pantoprazole  40 mg Oral Daily    Assessment: 66 yom presenting with nonhealing wound of right foot with gangrene of 4th and 5th toes, s/p amputation (AET 1157 10/30). Being consulted to start heparin for atrial fibrillation. Patient is on apixaban at home, with last dose being documented as taken on 10/26.   Hgb is down slightly from admission  (10>8.8) and platelets are within normal limits. No signs/symptoms of bleeding documented. Baseline aPTT 51 and heparin level is 0.31 roughly 5 hours after heparin received in cath- do not believe PTA apixaban will be influencing future heparin levels.    Goal of Therapy:  Heparin level 0.3-0.7 units/ml Monitor platelets by anticoagulation protocol: Yes   Plan:  Start heparin infusion at 1450 units/hr tonight at 2100 Check anti-Xa level in 6 hours and daily while on heparin Continue to monitor H&H and platelets  Doylene Canard, PharmD Clinical Pharmacist  Pager: [phone] Clinical Phone for 09/10/2017 until 3:30pm: [phone] If after 3:30pm, please call main pharmacy at [phone]

## 2017-09-10 NOTE — Anesthesia Postprocedure Evaluation (Signed)
Anesthesia Post Note  Patient: Alexander [name]  Procedure(s) Performed: RIGHT SUPERFICIAL  FEMORAL ARTERY-BELOW KNEE POPLITEAL ARTERY BYPASS GRAFT WITH VEIN (Right Leg Lower) RIGHT FOURTH AND FIFTH TOE AMPUTATION (Right Toe) INTRA OPERATIVE ARTERIOGRAM (Right Leg Lower)     Patient location during evaluation: PACU Anesthesia Type: General Level of consciousness: awake and alert Pain management: pain level controlled Vital Signs Assessment: post-procedure vital signs reviewed and stable Respiratory status: spontaneous breathing, nonlabored ventilation, respiratory function stable and patient connected to nasal cannula oxygen Cardiovascular status: blood pressure returned to baseline and stable Postop Assessment: no apparent nausea or vomiting Anesthetic complications: no    Last Vitals:  Vitals:   09/10/17 1310 09/10/17 1315  BP: (!) 161/58   Pulse: 73 72  Resp: 17 18  Temp:  36.7 C  SpO2: 94% 97%    Last Pain:  Vitals:   09/10/17 1300  TempSrc:   PainSc: 0-No pain                 [name] DAVID

## 2017-09-10 NOTE — Anesthesia Procedure Notes (Signed)
Procedure Name: Intubation Date/Time: 09/10/2017 7:56 AM Performed by: Lillia Abed Pre-anesthesia Checklist: Patient identified, Emergency Drugs available, Suction available and Patient being monitored Patient Re-evaluated:Patient Re-evaluated prior to induction Oxygen Delivery Method: Circle system utilized Preoxygenation: Pre-oxygenation with 100% oxygen Induction Type: IV induction Ventilation: Mask ventilation without difficulty and Nasal airway inserted- appropriate to patient size Laryngoscope Size: Mac and 3 Grade View: Grade II Tube type: Oral Tube size: 7.0 mm Number of attempts: 1 Airway Equipment and Method: Stylet Placement Confirmation: ETT inserted through vocal cords under direct vision,  positive ETCO2 and breath sounds checked- equal and bilateral Secured at: 22 cm Tube secured with: Tape Dental Injury: Teeth and Oropharynx as per pre-operative assessment

## 2017-09-10 NOTE — Progress Notes (Signed)
Inpatient Diabetes Program Recommendations  AACE/ADA: New Consensus Statement on Inpatient Glycemic Control (2015)  Target Ranges:  Prepandial:   less than 140 mg/dL      Peak postprandial:   less than 180 mg/dL (1-2 hours)      Critically ill patients:  140 - 180 mg/dL   Results for Alexander [name], Alexander [name] (MRN [id number]) as of 09/10/2017 11:48  Ref. Range 09/09/2017 12:50 09/09/2017 14:25 09/09/2017 18:50 09/09/2017 21:40 09/10/2017 06:05  Glucose-Capillary Latest Ref Range: 65 - 99 mg/dL 161 (H) 274 (H) 339 (H) 273 (H) 236 (H)   Review of Glycemic Control  Diabetes history: DM 2 Outpatient Diabetes medications: 70/30 56 units QAM, 46 units QPM Current orders for Inpatient glycemic control: Novolog Moderate Correction 0-15 units tid + Novolog HS scale 0-5 units  A1c 6.5% on 09/10/17 good control  Inpatient Diabetes Program Recommendations:    Patient takes 70/30 insulin at home. Glucose slightly elevated this am. Consider low dose Lantus 12 units Q24 hours (0.1units/kg) while inpatient. May have to increase when patient starts eating.  Thanks,  Tama Headings RN, MSN, [facility] Inpatient Diabetes Coordinator Team Pager [phone] (8a-5p)

## 2017-09-10 NOTE — Progress Notes (Signed)
Patient politely refused cilostazol (pletal) this evening. Per patient's wife it makes the pt "feel funny". Marked dose as not given. Recommended family speak to MD in the am.   Fritz Pickerel, RN

## 2017-09-10 NOTE — Progress Notes (Signed)
Met with patient and family member. Had prayer with them for healthy recovery. Conard Novak, Chaplain   09/10/17 1500  Clinical Encounter Type  Visited With Patient and family together  Visit Type Initial;Spiritual support  Referral From Nurse  Consult/Referral To Chaplain  Spiritual Encounters  Spiritual Needs Prayer  Stress Factors  Patient Stress Factors None identified  Family Stress Factors None identified

## 2017-09-10 NOTE — Op Note (Signed)
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  Right superficial femoral artery to below-knee popliteal artery bypass with non-reversed translocated saphenous vein graft 2.  Intraoperative arteriogram 3.  Ray amputation of the right fourth and fifth toes  SURGEON: Judeth Cornfield. Scot Dock, MD, FACS  ASSIST: [name], MD, Linus Orn, PA  ANESTHESIA: General  EBL: Minimal  INDICATIONS:    Alexander [name] is a 56 y.o. male who presented with a nonhealing wound of his right foot with gangrene of the fourth and fifth toes.  He had infrainguinal arterial occlusive disease and presents for attempted revascularization.   FINDINGS:   Completion arteriogram showed no technical problems.  TECHNIQUE:   The patient was taken to the operating room and a general anesthetic.  Right lower extremity was prepped and draped in the usual sterile fashion.  Longitudinal incision was made in the right groin and through this incision the common femoral artery was dissected free.  This is markedly calcified as was the superficial femoral artery.  Felt that it would be very difficult to clamp here.  I therefore dissected lower on the superficial femoral artery and was able to find a soft area.  Had an excellent pulse at this area and I felt that it would be best to originate the graft from this area.  Through the same incision the saphenofemoral junction was dissected free.  Using 3 additional incisions along the medial aspect of the right leg great saphenous vein was harvested down to the proximal calf.  Branches were divided between clips and 3-0 silk ties.  Through the distal incision the below-knee popliteal artery was exposed and it was soft and amenable to bypass.  A tunnel was then created  from the below the knee incision to the groin incision and the patient was then heparinized.  The great saphenous vein was ligated distally.  At the saphenofemoral junction the vein was excised from the femoral vein oversewn  Attention was first turned to the proximal anastomosis.  The superficial femoral artery was clamped proximally distally and a longitudinal arteriotomy was made.  The vein was used in a non-reversed fashion.  Spatulated and sewn end to side to the superficial femoral artery using continuous 6-0 Prolene suture.  Next using retrograde Jerelene Redden valvulotome the valves were excised.  Excellent flow was established to the graft which was flushed with heparinized saline.  It was then marked to prevent twisting.  It was then brought to the previously created tunnel for anastomosis to the below-knee popliteal artery.  Tourniquet was placed on the thigh.  The leg was exsanguinated with an Esmarch bandage.  Tourniquet was inflated to 300 mmHg.  Under tourniquet control, a longitudinal arteriotomy was made in the below-knee popliteal artery.  The vein was cut to appropriate length, spatulated and sewn end to side to the below-knee popliteal artery using continuous 6-0 Prolene suture.  Patient is a good posterior tibial signal with the Doppler.  An intraoperative arteriogram was obtained by cannulating the proximal graft.  This showed no technical problems.  There is two-vessel runoff via the anterior tibial and posterior tibial arteries.  Hemostasis was obtained in the wounds and the heparin was partially with protamine.  The groin incision is closed over a  drain with 2 deep layers of 2-0 Vicryl, subcutaneously with Vicryl and the skin with 4-0 Vicryl.  The vein harvest incisions were closed with 2 deep layers of 3-0 Vicryl and the skin closed with 4-0 Vicryl.  Attention was then turned to the right foot.  This had already been prepped into the field.  An elliptical incision was made encompassing the  fourth and fifth toes.  Dissection was carried down to the metatarsal bone which was divided.  Hemostasis was obtained in the wound.  There is no obvious deep space infection.  Wound was irrigated.  Interrupted 3-0 Vicryl for place for the deep layer and then the wound closed with interrupted 3-0 nylon's.  There is too much tension to close the wound in its entirety and the medial aspect was left slightly open.  Sterile dressing was applied.  Patient tolerated the procedure well and was transferred to the recovery room in stable condition.  All needle and sponge counts were correct.   [name], MD, FACS [facility]  DATE OF DICTATION:   09/10/2017

## 2017-09-10 NOTE — Progress Notes (Signed)
  Day of Surgery Note    Subjective:  No complaints.   Vitals:   09/10/17 1156 09/10/17 1200  BP: (!) 155/106   Pulse: (!) 50 60  Resp: 14 14  Temp: 98.3 F (36.8 C)   SpO2: 94% 94%    Incisions:   right groin bandage clean and dry-no hematoma; ace wrap in place on foot and leg and are clean and dry Extremities:  Brisk right AT/PT doppler signals Cardiac:  regular Lungs:  Non labored   Assessment/Plan:  This is a 56 y.o. male who is s/p  Right Femoral to tibial bypass  -pt with brisk right AT/PT doppler signals -minimal output from drains. -right groin bandage clean and dry-no hematoma -to 4 east later this afternoon.   Leontine Locket, PA-C 09/10/2017 12:08 PM [phone]

## 2017-09-11 ENCOUNTER — Encounter (HOSPITAL_COMMUNITY): Payer: Self-pay | Admitting: Vascular Surgery

## 2017-09-11 LAB — BASIC METABOLIC PANEL
Anion gap: 8 (ref 5–15)
BUN: 30 mg/dL — AB (ref 6–20)
CHLORIDE: 106 mmol/L (ref 101–111)
CO2: 22 mmol/L (ref 22–32)
CREATININE: 1.59 mg/dL — AB (ref 0.61–1.24)
Calcium: 8.4 mg/dL — ABNORMAL LOW (ref 8.9–10.3)
GFR, EST AFRICAN AMERICAN: 54 mL/min — AB (ref >60)
GFR, EST NON AFRICAN AMERICAN: 46 mL/min — AB (ref >60)
Glucose, Bld: 235 mg/dL — ABNORMAL HIGH (ref 65–99)
Potassium: 4.3 mmol/L (ref 3.5–5.1)
SODIUM: 136 mmol/L (ref 135–145)

## 2017-09-11 LAB — CBC
HCT: 25.9 % — ABNORMAL LOW (ref 39.0–52.0)
Hemoglobin: 8.6 g/dL — ABNORMAL LOW (ref 13.0–17.0)
MCH: 29.8 pg (ref 26.0–34.0)
MCHC: 33.2 g/dL (ref 30.0–36.0)
MCV: 89.6 fL (ref 78.0–100.0)
PLATELETS: 178 10*3/uL (ref 150–400)
RBC: 2.89 MIL/uL — ABNORMAL LOW (ref 4.22–5.81)
RDW: 12 % (ref 11.5–15.5)
WBC: 6.7 10*3/uL (ref 4.0–10.5)

## 2017-09-11 LAB — GLUCOSE, CAPILLARY
GLUCOSE-CAPILLARY: 235 mg/dL — AB (ref 65–99)
GLUCOSE-CAPILLARY: 259 mg/dL — AB (ref 65–99)
Glucose-Capillary: 272 mg/dL — ABNORMAL HIGH (ref 65–99)
Glucose-Capillary: 237 mg/dL — ABNORMAL HIGH (ref 65–99)

## 2017-09-11 LAB — HEPARIN LEVEL (UNFRACTIONATED)
HEPARIN UNFRACTIONATED: 0.36 [IU]/mL (ref 0.30–0.70)
HEPARIN UNFRACTIONATED: 0.38 [IU]/mL (ref 0.30–0.70)

## 2017-09-11 NOTE — Progress Notes (Signed)
ANTICOAGULATION CONSULT NOTE - F/u Consult  Pharmacy Consult for heparin Indication: atrial fibrillation  Allergies  Allergen Reactions  . Sulfa Antibiotics Rash   Patient Measurements: Height: 6' (182.9 cm) Weight: 260 lb (117.9 kg) IBW/kg (Calculated) : 77.6 Heparin Dosing Weight: 103 kg  Vital Signs: Temp: 98.9 F (37.2 C) (10/31 0749) Temp Source: Oral (10/31 0749) BP: 124/54 (10/31 0749) Pulse Rate: 70 (10/31 0749)  Labs:  Recent Labs  09/09/17 0946 09/09/17 1642 09/10/17 0942 09/10/17 1422 09/11/17 0248 09/11/17 0836  HGB 9.9* 10.1* 8.8*  --  8.6*  --   HCT 29.0* 29.9* 26.0*  --  25.9*  --   PLT  --  194  --   --  178  --   APTT  --  55*  --  51*  --   --   LABPROT  --  14.9  --   --   --   --   INR  --  1.18  --   --   --   --   HEPARINUNFRC  --   --   --  0.31 0.36 0.38  CREATININE 1.60* 1.51*  --   --  1.59*  --     Estimated Creatinine Clearance: 67.1 mL/min (A) (by C-G formula based on SCr of 1.59 mg/dL (H)).   Medical History: Past Medical History:  Diagnosis Date  . Alcohol abuse   . Anemia   . Arthritis   . Atrial fibrillation ([place])   . Cardiac syncope 10/07/14   rapid A fib with 8 sec pauses on converison with syncope- temp pacing wire placed then PPM  . Cataract    BILATERAL  . Chronic kidney disease   . Coronary artery disease   . Diabetes mellitus    dx---been  awhile  . Hyperlipemia   . Hypertension   . Peripheral vascular disease ([place])   . Presence of permanent cardiac pacemaker 10/08/2014    Medications:  Scheduled:  . amLODipine  10 mg Oral Daily  . aspirin  81 mg Oral Daily  . atorvastatin  40 mg Oral Daily  . chlorhexidine  60 mL Topical Once  . chlorthalidone  50 mg Oral Daily  . cilostazol  100 mg Oral BID AC  . docusate sodium  100 mg Oral Daily  . hydrALAZINE  100 mg Oral TID  . insulin aspart  0-15 Units Subcutaneous TID WC  . insulin aspart  0-5 Units Subcutaneous QHS  . lisinopril  10 mg Oral Daily  .  metoprolol tartrate  100 mg Oral BID  . multivitamin with minerals  1 tablet Oral Daily  . pantoprazole  40 mg Oral Daily  . prazosin  2 mg Oral QPM    Assessment: 42 yom presenting with nonhealing wound of right foot with gangrene of 4th and 5th toes, s/p amputation 10/30. Pharmacy consulted to start heparin for atrial fibrillation. Patient is on apixaban at home, with last dose being documented as taken on 10/26.   Heparin level after start of infusion is therapeutic on 1450 units/hr. Repeat level this morning came back at 0.38, within goal range. CBC stable and no overt s/s bleeding documented.    Goal of Therapy:  Heparin level 0.3-0.7 units/ml Monitor platelets by anticoagulation protocol: Yes   Plan:  Continue heparin gtt at 1450 units/hr Daily heparin level and CBC Monitor for s/s bleeding Follow up for plan to restart apixaban  Doylene Canard, PharmD Clinical Pharmacist  Pager: [phone] Clinical Phone for  09/11/2017 until 3:30pm: [phone] If after 3:30pm, please call main pharmacy at [phone]

## 2017-09-11 NOTE — Progress Notes (Addendum)
[facility] [place]  Subjective  - Doing well over all.   Objective (!) 162/57 74 99.2 F (37.3 C) (Oral) 20 93%  Intake/Output Summary (Last 24 hours) at 09/11/17 0727 Last data filed at 09/11/17 0358  Gross per 24 hour  Intake          4412.47 ml  Output             1288 ml  Net          3124.47 ml    Doppler PT/AT right LE JP x 3 98 cc total since surgery Ace wraps and foot dressing clean and dry Heart RRR Lungs non labored breathing  Assessment/Planning: POD # 1 1.  Right superficial femoral artery to below-knee popliteal artery bypass with non-reversed translocated saphenous vein graft 2.  Intraoperative arteriogram 3.  Ray amputation of the right fourth and fifth toes  Will order Darco shoe for heel weight bearing Will maintain drains today PT for mobility Pending ABI's Will start Eliquis before he is discharged home  [facility] 09/11/2017 7:27 AM --  Laboratory Lab Results:  Recent Labs  09/09/17 1642 09/10/17 0942 09/11/17 0248  WBC 6.3  --  6.7  HGB 10.1* 8.8* 8.6*  HCT 29.9* 26.0* 25.9*  PLT 194  --  178   BMET  Recent Labs  09/09/17 1642 09/10/17 0942 09/11/17 0248  NA 135 141 136  K 4.2 4.5 4.3  CL 106  --  106  CO2 19*  --  22  GLUCOSE 307* 189* 235*  BUN 36*  --  30*  CREATININE 1.51*  --  1.59*  CALCIUM 8.7*  --  8.4*    COAG Lab Results  Component Value Date   INR 1.18 09/09/2017   INR 1.20 08/08/2017   INR 1.02 04/30/2014   No results found for: PTT  I have independently interviewed and examined patient and agree with PA assessment and plan above.   Tony Friscia C. Donzetta Matters, MD [facility] [place] Office: [phone] Pager: [phone]

## 2017-09-11 NOTE — Evaluation (Signed)
Occupational Therapy Evaluation Patient Details Name: Alexander [name] MRN: [id number] DOB: May 28, 1961 Today's Date: 09/11/2017    History of Present Illness Pt adm with diabetic foot ulcer on R foot. Pt underwent amputation of rt 4th and 5th ray and RLE bpg on 10/30. PMH - PAD, L transmetatarsal amputation, dm, htn, ckd, afib, pacemaker.   Clinical Impression   PTA, pt was independent with ADL and occasionally used cane for functional mobility. He currently requires mod assist for LB ADL and mod assist for toilet transfers with RW. He presents with R LE pain, decreased activity tolerance for ADL, and decreased R LE strength impacting his independence and safety with ADL and ADL transfers. He would benefit from continued OT services while admitted to improve independence with ADL and functional mobiltiy prior to return home with 24 hour assistance from family. Pt may benefit from home health OT follow-up to maximize independence in home environment especially as his recovery is complicated by previous L transmetatarsal amputation.     Follow Up Recommendations  Home health OT;Supervision/Assistance - 24 hour    Equipment Recommendations  None recommended by OT    Recommendations for Other Services       Precautions / Restrictions Precautions Precautions: Fall Precaution Comments: pacemaker Required Braces or Orthoses: Other Brace/Splint Other Brace/Splint: Darco Restrictions Weight Bearing Restrictions: Yes RLE Weight Bearing: Weight bearing as tolerated Other Position/Activity Restrictions: on heel with Darco shoe      Mobility Bed Mobility               General bed mobility comments: OOB in chair on my arrival  Transfers Overall transfer level: Needs assistance Equipment used: Rolling walker (2 wheeled) Transfers: Sit to/from Stand Sit to Stand: Mod assist         General transfer comment: Mod assist to power up into full standing position with 2 efforts. Chair  proved to be difficult surface to rise from for pt.     Balance Overall balance assessment: Needs assistance Sitting-balance support: No upper extremity supported;Feet supported Sitting balance-Leahy Scale: Good     Standing balance support: Bilateral upper extremity supported Standing balance-Leahy Scale: Poor Standing balance comment: UE support from RW and min assist for standing balance                           ADL either performed or assessed with clinical judgement   ADL Overall ADL's : Needs assistance/impaired Eating/Feeding: Set up;Sitting   Grooming: Set up;Sitting   Upper Body Bathing: Supervision/ safety;Sitting   Lower Body Bathing: Moderate assistance;Sit to/from stand   Upper Body Dressing : Supervision/safety;Sitting   Lower Body Dressing: Moderate assistance;Sit to/from stand   Toilet Transfer: Moderate assistance;Ambulation;RW;BSC Toilet Transfer Details (indicate cue type and reason): Taking only a few steps; mod assist to rise from chair; min assist once on his feet Toileting- Clothing Manipulation and Hygiene: Sit to/from stand;Minimal assistance Toileting - Clothing Manipulation Details (indicate cue type and reason): Mod assist for sit<>stand     Functional mobility during ADLs: Moderate assistance;Rolling walker General ADL Comments: Mod assist to rise to standing. Pt limited by pain. Has a good understanding of recovery process.      Vision   Additional Comments: Need to further assess. No deficits noted with functional tasks but need to clarify.     Perception     Praxis      Pertinent Vitals/Pain Pain Assessment: Faces Faces Pain Scale: Hurts even more  Pain Location: RLE Pain Descriptors / Indicators: Burning Pain Intervention(s): Limited activity within patient's tolerance;Monitored during session;Repositioned     Hand Dominance Right   Extremity/Trunk Assessment Upper Extremity Assessment Upper Extremity Assessment:  Overall WFL for tasks assessed   Lower Extremity Assessment Lower Extremity Assessment: Defer to PT evaluation       Communication Communication Communication: No difficulties   Cognition Arousal/Alertness: Awake/alert Behavior During Therapy: WFL for tasks assessed/performed Overall Cognitive Status: Within Functional Limits for tasks assessed                                     General Comments  VSS throughout. Pt limited by pain.     Exercises     Shoulder Instructions      Home Living Family/patient expects to be discharged to:: Private residence Living Arrangements: Spouse/significant other Available Help at Discharge: Family;Available 24 hours/day Type of Home: House Home Access: Stairs to enter [employer] of Steps: 2 Entrance Stairs-Rails: None Home Layout: One level     Bathroom Shower/Tub: Occupational psychologist: Standard     Home Equipment: Environmental consultant - 2 wheels;Crutches;Cane - single point;Bedside commode (need to confirm if has handheld shower head)          Prior Functioning/Environment Level of Independence: Independent with assistive device(s)        Comments: Used cane at times.         OT Problem List: Decreased activity tolerance;Impaired balance (sitting and/or standing);Decreased safety awareness;Decreased knowledge of use of DME or AE;Decreased knowledge of precautions;Pain      OT Treatment/Interventions: Self-care/ADL training;Therapeutic exercise;Energy conservation;DME and/or AE instruction;Therapeutic activities;Patient/family education;Balance training    OT Goals(Current goals can be found in the care plan section) Acute Rehab OT Goals Patient Stated Goal: return home OT Goal Formulation: With patient Time For Goal Achievement: 09/25/17 Potential to Achieve Goals: Good ADL Goals Pt Will Perform Grooming: with min guard assist;standing Pt Will Perform Upper Body Dressing: with modified  independence;sitting Pt Will Perform Lower Body Dressing: with min guard assist;sit to/from stand Pt Will Transfer to Toilet: with supervision;ambulating;bedside commode (BSC over toielet) Pt Will Perform Toileting - Clothing Manipulation and hygiene: with supervision;sit to/from stand Pt Will Perform Tub/Shower Transfer: with min assist;Shower transfer;ambulating;3 in 1;rolling walker  OT Frequency: Min 2X/week   Barriers to D/C:            Co-evaluation              AM-PAC PT "6 Clicks" Daily Activity     Outcome Measure Help from another person eating meals?: None Help from another person taking care of personal grooming?: A Little Help from another person toileting, which includes using toliet, bedpan, or urinal?: A Lot Help from another person bathing (including washing, rinsing, drying)?: A Lot Help from another person to put on and taking off regular upper body clothing?: A Little Help from another person to put on and taking off regular lower body clothing?: A Lot 6 Click Score: 16   End of Session Equipment Utilized During Treatment: Rolling walker;Gait belt Nurse Communication: Mobility status  Activity Tolerance: Patient tolerated treatment well Patient left: in chair;with call bell/phone within reach  OT Visit Diagnosis: Other abnormalities of gait and mobility (R26.89);Pain Pain - Right/Left: Right Pain - part of body: Ankle and joints of foot  Time: 9604-5409 OT Time Calculation (min): 19 min Charges:  OT General Charges $OT Visit: 1 Visit OT Evaluation $OT Eval Moderate Complexity: 1 Mod G-Codes:     Norman Herrlich, MS OTR/L  Pager: [place] A Alexander [name] 09/11/2017, 2:26 PM

## 2017-09-11 NOTE — Progress Notes (Signed)
Orthopedic Tech Progress Note Patient Details:  Alexander [name] Aug 17, 1961 [id number]  Ortho Devices Type of Ortho Device: Darco shoe Ortho Device/Splint Location: rle Ortho Device/Splint Interventions: Application   Hildred Priest 09/11/2017, 10:15 AM Viewed order from doctor's order list

## 2017-09-11 NOTE — Progress Notes (Signed)
Inpatient Diabetes Program Recommendations  AACE/ADA: New Consensus Statement on Inpatient Glycemic Control (2015)  Target Ranges:  Prepandial:   less than 140 mg/dL      Peak postprandial:   less than 180 mg/dL (1-2 hours)      Critically ill patients:  140 - 180 mg/dL   Lab Results  Component Value Date   GLUCAP 235 (H) 09/11/2017   HGBA1C 6.5 (H) 09/10/2017    Review of Glycemic Control Results for ARSH, FEUTZ (MRN [id number]) as of 09/11/2017 09:45  Ref. Range 09/10/2017 06:05 09/10/2017 11:58 09/10/2017 16:44 09/10/2017 21:00 09/11/2017 06:09  Glucose-Capillary Latest Ref Range: 65 - 99 mg/dL 236 (H) 200 (H) 240 (H) 208 (H) 235 (H)   Diabetes history: DM 2 Outpatient Diabetes medications: 70/30 56 units QAM, 46 units QPM Current orders for Inpatient glycemic control: Novolog Moderate Correction 0-15 units tid + Novolog HS scale 0-5 units  Inpatient Diabetes Program Recommendations:   Eating well. Noted elevated postprandials. -70/30 insulin mix 25 units bid ac meals  Thank you, Nani Gasser. Laverta Harnisch, RN, MSN, CDE  Diabetes Coordinator Inpatient Glycemic Control Team Team Pager [phone] (8am-5pm) 09/11/2017 9:48 AM

## 2017-09-11 NOTE — Progress Notes (Signed)
ANTICOAGULATION CONSULT NOTE - F/u Consult  Pharmacy Consult for heparin Indication: atrial fibrillation  Allergies  Allergen Reactions  . Sulfa Antibiotics Rash   Patient Measurements: Height: 6' (182.9 cm) Weight: 260 lb (117.9 kg) IBW/kg (Calculated) : 77.6 Heparin Dosing Weight: 103 kg  Vital Signs: Temp: 99.2 F (37.3 C) (10/31 0316) Temp Source: Oral (10/31 0316) BP: 162/57 (10/31 0316) Pulse Rate: 74 (10/31 0316)  Labs:  Recent Labs  09/09/17 0946 09/09/17 1642 09/10/17 0942 09/10/17 1422 09/11/17 0248  HGB 9.9* 10.1* 8.8*  --  8.6*  HCT 29.0* 29.9* 26.0*  --  25.9*  PLT  --  194  --   --  178  APTT  --  55*  --  51*  --   LABPROT  --  14.9  --   --   --   INR  --  1.18  --   --   --   HEPARINUNFRC  --   --   --  0.31 0.36  CREATININE 1.60* 1.51*  --   --   --     Estimated Creatinine Clearance: 70.7 mL/min (A) (by C-G formula based on SCr of 1.51 mg/dL (H)).   Medical History: Past Medical History:  Diagnosis Date  . Alcohol abuse   . Anemia   . Arthritis   . Atrial fibrillation ([place])   . Cardiac syncope 10/07/14   rapid A fib with 8 sec pauses on converison with syncope- temp pacing wire placed then PPM  . Cataract    BILATERAL  . Chronic kidney disease   . Coronary artery disease   . Diabetes mellitus    dx---been  awhile  . Hyperlipemia   . Hypertension   . Peripheral vascular disease ([place])   . Presence of permanent cardiac pacemaker 10/08/2014    Medications:  Scheduled:  . amLODipine  10 mg Oral Daily  . aspirin  81 mg Oral Daily  . atorvastatin  40 mg Oral Daily  . chlorhexidine  60 mL Topical Once  . chlorthalidone  50 mg Oral Daily  . cilostazol  100 mg Oral BID AC  . docusate sodium  100 mg Oral Daily  . hydrALAZINE  100 mg Oral TID  . insulin aspart  0-15 Units Subcutaneous TID WC  . insulin aspart  0-5 Units Subcutaneous QHS  . lisinopril  10 mg Oral Daily  . metoprolol tartrate  100 mg Oral BID  . multivitamin with  minerals  1 tablet Oral Daily  . pantoprazole  40 mg Oral Daily  . prazosin  2 mg Oral QPM    Assessment: 69 yom presenting with nonhealing wound of right foot with gangrene of 4th and 5th toes, s/p amputation 10/30. Pharmacy consulted to start heparin for atrial fibrillation. Patient is on apixaban at home, with last dose being documented as taken on 10/26.   Heparin level is therapeutic at 0.36. CBC stable and no overt s/s bleeding documented.    Goal of Therapy:  Heparin level 0.3-0.7 units/ml Monitor platelets by anticoagulation protocol: Yes   Plan:  Continue heparin gtt at 1450 units/hr Heparin level in 6 hrs to confirm Daily heparin level and CBC Monitor for s/s bleeding  Lavonda Jumbo, PharmD Clinical Pharmacist 09/11/17 3:45 AM

## 2017-09-11 NOTE — Evaluation (Signed)
Physical Therapy Evaluation Patient Details Name: Alexander [name] MRN: [id number] DOB: Dec 30, 1960 Today's Date: 09/11/2017   History of Present Illness  Pt adm with diabetic foot ulcer on rt foot. Pt underwent amputation of rt 4th and 5th ray and RLE bpg on 10/30. PMH - PAD, lt transmetatarsal amputation, dm, htn, ckd, afib, pacer,   Clinical Impression  Pt admitted with above diagnosis and presents to PT with functional limitations due to deficits listed below (See PT problem list). Pt needs skilled PT to maximize independence and safety to allow discharge to home with wife. Expect progress will be steady but slower than the norm due to already having a transmetatarsal amputation on the lt.     Follow Up Recommendations Home health PT;Supervision for mobility/OOB    Equipment Recommendations  None recommended by PT    Recommendations for Other Services       Precautions / Restrictions Precautions Precautions: Fall Required Braces or Orthoses: Other Brace/Splint Other Brace/Splint: Darco Restrictions Weight Bearing Restrictions: Yes RLE Weight Bearing: Weight bearing as tolerated Other Position/Activity Restrictions: on heel with Darco      Mobility  Bed Mobility               General bed mobility comments: Pt up in chair  Transfers Overall transfer level: Needs assistance Equipment used: Rolling walker (2 wheeled) Transfers: Sit to/from Stand Sit to Stand: Mod assist;Min assist;From elevated surface         General transfer comment: Assist to bring hips up. Mod assist from low recliner and min assist from elevated bed. Verbal cues for hand placement  Ambulation/Gait Ambulation/Gait assistance: Min assist Ambulation Distance (Feet): 10 Feet (x 2) Assistive device: Rolling walker (2 wheeled) Gait Pattern/deviations: Step-to pattern;Decreased step length - right;Decreased step length - left Gait velocity: decr Gait velocity interpretation: Below normal speed  for age/gender General Gait Details: Assist for balance and support. Pt with heavy weight bearing on walker resulting in fatigue of UE's.   Stairs            Wheelchair Mobility    Modified Rankin (Stroke Patients Only)       Balance Overall balance assessment: Needs assistance Sitting-balance support: No upper extremity supported;Feet supported Sitting balance-Leahy Scale: Good     Standing balance support: Bilateral upper extremity supported Standing balance-Leahy Scale: Poor Standing balance comment: support of walker and min guard for static standing                             Pertinent Vitals/Pain Pain Assessment: 0-10 Pain Score: 8  Pain Location: RLE Pain Descriptors / Indicators: Burning Pain Intervention(s): Limited activity within patient's tolerance;Premedicated before session;Repositioned    Home Living Family/patient expects to be discharged to:: Private residence Living Arrangements: Spouse/significant other Available Help at Discharge: Family;Available 24 hours/day Type of Home: House Home Access: Stairs to enter Entrance Stairs-Rails: None Entrance Stairs-Number of Steps: 2 Home Layout: One level Home Equipment: Walker - 2 wheels;Crutches;Cane - single point;Bedside commode      Prior Function Level of Independence: Independent with assistive device(s)         Comments: Used cane at times.      Hand Dominance   Dominant Hand: Right    Extremity/Trunk Assessment   Upper Extremity Assessment Upper Extremity Assessment: Defer to OT evaluation    Lower Extremity Assessment Lower Extremity Assessment: RLE deficits/detail;LLE deficits/detail RLE Deficits / Details: 4th and 5th ray amputation RLE  Sensation: history of peripheral neuropathy LLE Deficits / Details: transmetarsal amputation LLE Sensation: history of peripheral neuropathy       Communication   Communication: No difficulties  Cognition Arousal/Alertness:  Awake/alert Behavior During Therapy: WFL for tasks assessed/performed Overall Cognitive Status: Within Functional Limits for tasks assessed                                        General Comments      Exercises     Assessment/Plan    PT Assessment Patient needs continued PT services  PT Problem List Decreased strength;Decreased activity tolerance;Decreased balance;Decreased mobility;Decreased knowledge of use of DME;Pain;Impaired sensation       PT Treatment Interventions DME instruction;Gait training;Therapeutic activities;Functional mobility training;Stair training;Therapeutic exercise;Balance training;Patient/family education    PT Goals (Current goals can be found in the Care Plan section)  Acute Rehab PT Goals Patient Stated Goal: return home PT Goal Formulation: With patient Time For Goal Achievement: 09/18/17 Potential to Achieve Goals: Good    Frequency Min 3X/week   Barriers to discharge        Co-evaluation               AM-PAC PT "6 Clicks" Daily Activity  Outcome Measure Difficulty turning over in bed (including adjusting bedclothes, sheets and blankets)?: Unable Difficulty moving from lying on back to sitting on the side of the bed? : Unable Difficulty sitting down on and standing up from a chair with arms (e.g., wheelchair, bedside commode, etc,.)?: Unable Help needed moving to and from a bed to chair (including a wheelchair)?: A Lot Help needed walking in hospital room?: A Lot Help needed climbing 3-5 steps with a railing? : A Lot 6 Click Score: 9    End of Session Equipment Utilized During Treatment: Gait belt Activity Tolerance: Patient limited by fatigue Patient left: in chair;with call bell/phone within reach Nurse Communication: Mobility status PT Visit Diagnosis: Unsteadiness on feet (R26.81);Other abnormalities of gait and mobility (R26.89);Pain Pain - Right/Left: Right Pain - part of body: Leg    Time: 1015-1040 PT  Time Calculation (min) (ACUTE ONLY): 25 min   Charges:   PT Evaluation $PT Eval Moderate Complexity: 1 Mod PT Treatments $Gait Training: 8-22 mins   PT G Codes:        [facility] PT [place] 09/11/2017, 11:51 AM

## 2017-09-12 ENCOUNTER — Inpatient Hospital Stay (HOSPITAL_COMMUNITY): Payer: Medicare Other | Admitting: Anesthesiology

## 2017-09-12 ENCOUNTER — Inpatient Hospital Stay (HOSPITAL_COMMUNITY): Payer: Medicare Other

## 2017-09-12 ENCOUNTER — Encounter (HOSPITAL_COMMUNITY): Payer: Medicare Other

## 2017-09-12 ENCOUNTER — Encounter (HOSPITAL_COMMUNITY): Payer: Self-pay | Admitting: *Deleted

## 2017-09-12 ENCOUNTER — Encounter (HOSPITAL_COMMUNITY)
Admission: AD | Disposition: A | Discharge status: Home Health | Payer: Self-pay | Source: Ambulatory Visit | Attending: Vascular Surgery

## 2017-09-12 DIAGNOSIS — I739 Peripheral vascular disease, unspecified: Secondary | ICD-10-CM

## 2017-09-12 DIAGNOSIS — T82868A Thrombosis of vascular prosthetic devices, implants and grafts, initial encounter: Secondary | ICD-10-CM

## 2017-09-12 HISTORY — PX: EMBOLECTOMY: SHX44

## 2017-09-12 LAB — CBC
HCT: 24.1 % — ABNORMAL LOW (ref 39.0–52.0)
HEMOGLOBIN: 8.2 g/dL — AB (ref 13.0–17.0)
MCH: 30.4 pg (ref 26.0–34.0)
MCHC: 34 g/dL (ref 30.0–36.0)
MCV: 89.3 fL (ref 78.0–100.0)
PLATELETS: 186 10*3/uL (ref 150–400)
RBC: 2.7 MIL/uL — AB (ref 4.22–5.81)
RDW: 12.1 % (ref 11.5–15.5)
WBC: 6.5 10*3/uL (ref 4.0–10.5)

## 2017-09-12 LAB — GLUCOSE, CAPILLARY
GLUCOSE-CAPILLARY: 197 mg/dL — AB (ref 65–99)
GLUCOSE-CAPILLARY: 212 mg/dL — AB (ref 65–99)
GLUCOSE-CAPILLARY: 254 mg/dL — AB (ref 65–99)
Glucose-Capillary: 227 mg/dL — ABNORMAL HIGH (ref 65–99)
Glucose-Capillary: 224 mg/dL — ABNORMAL HIGH (ref 65–99)

## 2017-09-12 LAB — HEPARIN LEVEL (UNFRACTIONATED): HEPARIN UNFRACTIONATED: 0.43 [IU]/mL (ref 0.30–0.70)

## 2017-09-12 SURGERY — EMBOLECTOMY
Anesthesia: General | Site: Leg Lower | Laterality: Right

## 2017-09-12 MED ORDER — SUGAMMADEX SODIUM 200 MG/2ML IV SOLN
INTRAVENOUS | Status: AC
  Filled 2017-09-12: qty 2

## 2017-09-12 MED ORDER — SUGAMMADEX SODIUM 200 MG/2ML IV SOLN
INTRAVENOUS | Status: DC | PRN
  Administered 2017-09-12: 200 mg via INTRAVENOUS

## 2017-09-12 MED ORDER — PHENYLEPHRINE HCL 10 MG/ML IJ SOLN
INTRAMUSCULAR | Status: DC | PRN
  Administered 2017-09-12: 20 ug/min via INTRAVENOUS

## 2017-09-12 MED ORDER — HEPARIN (PORCINE) IN NACL 100-0.45 UNIT/ML-% IJ SOLN: 500.0000 [IU]/h | INTRAMUSCULAR | Status: DC

## 2017-09-12 MED ORDER — SODIUM CHLORIDE 0.9 % IV SOLN
INTRAVENOUS | Status: DC | PRN
  Administered 2017-09-12: 500 mL

## 2017-09-12 MED ORDER — LIDOCAINE 2% (20 MG/ML) 5 ML SYRINGE
INTRAMUSCULAR | Status: DC | PRN
  Administered 2017-09-12: 60 mg via INTRAVENOUS

## 2017-09-12 MED ORDER — HYDROMORPHONE HCL 1 MG/ML IJ SOLN: 0.2500 mg | INTRAMUSCULAR | Status: DC | PRN

## 2017-09-12 MED ORDER — DEXAMETHASONE SODIUM PHOSPHATE 10 MG/ML IJ SOLN
INTRAMUSCULAR | Status: AC
  Filled 2017-09-12: qty 1

## 2017-09-12 MED ORDER — PHENYLEPHRINE HCL 10 MG/ML IJ SOLN
INTRAMUSCULAR | Status: AC
  Filled 2017-09-12: qty 1

## 2017-09-12 MED ORDER — MIDAZOLAM HCL 2 MG/2ML IJ SOLN
INTRAMUSCULAR | Status: AC
  Filled 2017-09-12: qty 2

## 2017-09-12 MED ORDER — PROPOFOL 10 MG/ML IV BOLUS
INTRAVENOUS | Status: AC
  Filled 2017-09-12: qty 20

## 2017-09-12 MED ORDER — DEXAMETHASONE SODIUM PHOSPHATE 10 MG/ML IJ SOLN
INTRAMUSCULAR | Status: DC | PRN
  Administered 2017-09-12: 10 mg via INTRAVENOUS

## 2017-09-12 MED ORDER — HEPARIN SODIUM (PORCINE) 1000 UNIT/ML IJ SOLN
INTRAMUSCULAR | Status: DC | PRN
  Administered 2017-09-12: 3000 [IU] via INTRAVENOUS
  Administered 2017-09-12: 5000 [IU] via INTRAVENOUS

## 2017-09-12 MED ORDER — FENTANYL CITRATE (PF) 100 MCG/2ML IJ SOLN
INTRAMUSCULAR | Status: DC | PRN
  Administered 2017-09-12: 50 ug via INTRAVENOUS
  Administered 2017-09-12 (×2): 25 ug via INTRAVENOUS
  Administered 2017-09-12: 50 ug via INTRAVENOUS

## 2017-09-12 MED ORDER — CEFAZOLIN SODIUM-DEXTROSE 2-4 GM/100ML-% IV SOLN
2.0000 g | INTRAVENOUS | Status: AC
  Administered 2017-09-12: 2 g via INTRAVENOUS
  Filled 2017-09-12: qty 100

## 2017-09-12 MED ORDER — HEPARIN SODIUM (PORCINE) 1000 UNIT/ML IJ SOLN
INTRAMUSCULAR | Status: AC
  Filled 2017-09-12: qty 1

## 2017-09-12 MED ORDER — FENTANYL CITRATE (PF) 250 MCG/5ML IJ SOLN
INTRAMUSCULAR | Status: AC
  Filled 2017-09-12: qty 5

## 2017-09-12 MED ORDER — LACTATED RINGERS IV SOLN
INTRAVENOUS | Status: DC
  Administered 2017-09-12 (×2): via INTRAVENOUS

## 2017-09-12 MED ORDER — 0.9 % SODIUM CHLORIDE (POUR BTL) OPTIME
TOPICAL | Status: DC | PRN
  Administered 2017-09-12: 2000 mL

## 2017-09-12 MED ORDER — PROMETHAZINE HCL 25 MG/ML IJ SOLN: 6.2500 mg | INTRAMUSCULAR | Status: DC | PRN

## 2017-09-12 MED ORDER — ROCURONIUM BROMIDE 10 MG/ML (PF) SYRINGE
PREFILLED_SYRINGE | INTRAVENOUS | Status: AC
  Filled 2017-09-12: qty 5

## 2017-09-12 MED ORDER — ROCURONIUM BROMIDE 10 MG/ML (PF) SYRINGE
PREFILLED_SYRINGE | INTRAVENOUS | Status: DC | PRN
  Administered 2017-09-12: 50 mg via INTRAVENOUS
  Administered 2017-09-12: 20 mg via INTRAVENOUS

## 2017-09-12 MED ORDER — LIDOCAINE 2% (20 MG/ML) 5 ML SYRINGE
INTRAMUSCULAR | Status: AC
  Filled 2017-09-12: qty 5

## 2017-09-12 MED ORDER — MIDAZOLAM HCL 2 MG/2ML IJ SOLN
INTRAMUSCULAR | Status: DC | PRN
  Administered 2017-09-12: 2 mg via INTRAVENOUS

## 2017-09-12 MED ORDER — CEFAZOLIN SODIUM-DEXTROSE 2-4 GM/100ML-% IV SOLN
INTRAVENOUS | Status: AC
  Filled 2017-09-12: qty 100

## 2017-09-12 MED ORDER — CEFAZOLIN SODIUM-DEXTROSE 2-4 GM/100ML-% IV SOLN
INTRAVENOUS | Status: AC
Start: 2017-09-12 — End: 2017-09-12
  Filled 2017-09-12: qty 100

## 2017-09-12 MED ORDER — ONDANSETRON HCL 4 MG/2ML IJ SOLN
INTRAMUSCULAR | Status: DC | PRN
Start: 2017-09-12 — End: 2017-09-12
  Administered 2017-09-12: 4 mg via INTRAVENOUS

## 2017-09-12 MED ORDER — ONDANSETRON HCL 4 MG/2ML IJ SOLN
INTRAMUSCULAR | Status: AC
  Filled 2017-09-12: qty 2

## 2017-09-12 MED ORDER — PROPOFOL 10 MG/ML IV BOLUS
INTRAVENOUS | Status: DC | PRN
  Administered 2017-09-12: 200 mg via INTRAVENOUS

## 2017-09-12 SURGICAL SUPPLY — 56 items
BIOPATCH RED 1 DISK 7.0 (GAUZE/BANDAGES/DRESSINGS) ×2 IMPLANT
CANISTER SUCT 3000ML PPV (MISCELLANEOUS) ×2 IMPLANT
CATH EMB 2FR 60CM (CATHETERS) ×2 IMPLANT
CATH EMB 3FR 80CM (CATHETERS) ×2 IMPLANT
CATH EMB 4FR 80CM (CATHETERS) ×2 IMPLANT
CATH EMB 5FR 80CM (CATHETERS) IMPLANT
CLIP VESOCCLUDE MED 24/CT (CLIP) ×2 IMPLANT
CLIP VESOCCLUDE SM WIDE 24/CT (CLIP) ×2 IMPLANT
DERMABOND ADVANCED (GAUZE/BANDAGES/DRESSINGS) ×1
DERMABOND ADVANCED .7 DNX12 (GAUZE/BANDAGES/DRESSINGS) ×1 IMPLANT
DRAIN CHANNEL 15F RND FF W/TCR (WOUND CARE) IMPLANT
DRAPE X-RAY CASS 24X20 (DRAPES) IMPLANT
DRSG COVADERM 4X10 (GAUZE/BANDAGES/DRESSINGS) ×2 IMPLANT
ELECT REM PT RETURN 9FT ADLT (ELECTROSURGICAL) ×2
ELECTRODE REM PT RTRN 9FT ADLT (ELECTROSURGICAL) ×1 IMPLANT
EVACUATOR SILICONE 100CC (DRAIN) ×2 IMPLANT
GAUZE SPONGE 4X4 12PLY STRL (GAUZE/BANDAGES/DRESSINGS) ×2 IMPLANT
GLOVE BIO SURGEON STRL SZ 6.5 (GLOVE) ×4 IMPLANT
GLOVE BIO SURGEON STRL SZ7.5 (GLOVE) ×4 IMPLANT
GLOVE BIO SURGEON STRL SZ8 (GLOVE) ×2 IMPLANT
GLOVE BIOGEL PI IND STRL 6.5 (GLOVE) ×3 IMPLANT
GLOVE BIOGEL PI IND STRL 8.5 (GLOVE) ×1 IMPLANT
GLOVE BIOGEL PI INDICATOR 6.5 (GLOVE) ×3
GLOVE BIOGEL PI INDICATOR 8.5 (GLOVE) ×1
GOWN STRL REUS W/ TWL LRG LVL3 (GOWN DISPOSABLE) ×2 IMPLANT
GOWN STRL REUS W/ TWL XL LVL3 (GOWN DISPOSABLE) ×1 IMPLANT
GOWN STRL REUS W/TWL LRG LVL3 (GOWN DISPOSABLE) ×2
GOWN STRL REUS W/TWL XL LVL3 (GOWN DISPOSABLE) ×3 IMPLANT
HEMOSTAT SNOW SURGICEL 2X4 (HEMOSTASIS) IMPLANT
KIT BASIN OR (CUSTOM PROCEDURE TRAY) ×2 IMPLANT
KIT ROOM TURNOVER OR (KITS) ×2 IMPLANT
NS IRRIG 1000ML POUR BTL (IV SOLUTION) ×4 IMPLANT
PACK PERIPHERAL VASCULAR (CUSTOM PROCEDURE TRAY) ×2 IMPLANT
PAD ARMBOARD 7.5X6 YLW CONV (MISCELLANEOUS) ×4 IMPLANT
SET COLLECT BLD 21X3/4 12 (NEEDLE) IMPLANT
STAPLER VISISTAT 35W (STAPLE) ×2 IMPLANT
STOPCOCK 4 WAY LG BORE MALE ST (IV SETS) IMPLANT
SUT ETHILON 3 0 PS 1 (SUTURE) IMPLANT
SUT MNCRL AB 4-0 PS2 18 (SUTURE) ×2 IMPLANT
SUT PROLENE 5 0 C 1 24 (SUTURE) IMPLANT
SUT PROLENE 6 0 BV (SUTURE) ×4 IMPLANT
SUT SILK 2 0 PERMA HAND 18 BK (SUTURE) ×2 IMPLANT
SUT SILK 2 0 SH (SUTURE) ×2 IMPLANT
SUT SILK 3 0 (SUTURE)
SUT SILK 3-0 18XBRD TIE 12 (SUTURE) IMPLANT
SUT VIC AB 2-0 CT1 27 (SUTURE) ×1
SUT VIC AB 2-0 CT1 TAPERPNT 27 (SUTURE) ×1 IMPLANT
SUT VIC AB 3-0 SH 27 (SUTURE) ×2
SUT VIC AB 3-0 SH 27X BRD (SUTURE) ×2 IMPLANT
SUT VIC AB 4-0 PS2 27 (SUTURE) ×2 IMPLANT
SYR 3ML LL SCALE MARK (SYRINGE) IMPLANT
SYRINGE 3CC LL L/F (MISCELLANEOUS) ×2 IMPLANT
TRAY FOLEY W/METER SILVER 16FR (SET/KITS/TRAYS/PACK) ×2 IMPLANT
TUBING EXTENTION W/L.L. (IV SETS) IMPLANT
UNDERPAD 30X30 (UNDERPADS AND DIAPERS) ×2 IMPLANT
WATER STERILE IRR 1000ML POUR (IV SOLUTION) ×2 IMPLANT

## 2017-09-12 NOTE — Transfer of Care (Signed)
Immediate Anesthesia Transfer of Care Note  Patient: Alexander [name]  Procedure(s) Performed: Thrombectomy  & Redo Right Below Knee Popliteal Artery Bypass Graft   (Right Leg Lower)  Patient Location: PACU  Anesthesia Type:General  Level of Consciousness: awake and oriented  Airway & Oxygen Therapy: Patient Spontanous Breathing  Post-op Assessment: Report given to RN  Post vital signs: Reviewed and stable  Last Vitals:  Vitals:   09/12/17 0348 09/12/17 1119  BP: (!) 125/55 (!) 142/65  Pulse: 61 64  Resp: 13   Temp: 36.9 C   SpO2: 96%     Last Pain:  Vitals:   09/12/17 0611  TempSrc:   PainSc: 0-No pain         Complications: No apparent anesthesia complications

## 2017-09-12 NOTE — Anesthesia Procedure Notes (Signed)
Procedure Name: Intubation Date/Time: 09/12/2017 3:05 PM Performed by: Sampson Si E Pre-anesthesia Checklist: Patient identified, Emergency Drugs available, Suction available and Patient being monitored Patient Re-evaluated:Patient Re-evaluated prior to induction Oxygen Delivery Method: Circle System Utilized Preoxygenation: Pre-oxygenation with 100% oxygen Induction Type: IV induction Ventilation: Mask ventilation without difficulty and Oral airway inserted - appropriate to patient size Laryngoscope Size: Mac and 4 Grade View: Grade I Tube type: Oral Tube size: 7.5 mm Number of attempts: 1 Airway Equipment and Method: Stylet and Oral airway Placement Confirmation: ETT inserted through vocal cords under direct vision,  positive ETCO2 and breath sounds checked- equal and bilateral Secured at: 22 cm Tube secured with: Tape Dental Injury: Teeth and Oropharynx as per pre-operative assessment

## 2017-09-12 NOTE — Care Management Note (Signed)
Case Management Note Marvetta Gibbons RN, BSN Unit 4E-Case Manager [phone]  Patient Details  Name: Alexander [name] MRN: [id number] Date of Birth: Jun 17, 1961  Subjective/Objective:    Pt admitted with PVD s/p  Fem-pop bypass graft- plan to return to OR on 09/12/17 for revision due to graft occluded.               Action/Plan: PTA pt lived at home with spouse- Per PT eval recommendations for [facility]- will need HH orders prior to discharge- CM to follow.   Expected Discharge Date:                  Expected Discharge Plan:  [place]  In-House Referral:     Discharge planning Services  CM Consult  Post Acute Care Choice:    Choice offered to:     DME Arranged:    DME Agency:     HH Arranged:    [place] Agency:     Status of Service:  In process, will continue to follow  If discussed at Long Length of Stay Meetings, dates discussed:    Discharge Disposition:   Additional Comments:  Dawayne Patricia, RN 09/12/2017, 2:07 PM

## 2017-09-12 NOTE — Op Note (Signed)
Patient name: Alexander [name] MRN: [id number] DOB: 03/25/1961 Sex: male  09/12/2017 Pre-operative Diagnosis: thrombosed femoral to popliteal bypass graft Post-operative diagnosis:  Same Surgeon:  Erlene Quan C. Donzetta Matters, MD Assistant: Linus Orn Procedure Performed: 1.  Re-exposure of right groin and below knee popliteal incisions less than 30 days 2.  Thrombectomy of right superficial femoral to popliteal vein bypass 3.  Re-do sfa to below knee bypass with reversed greater saphenous vein tunneled medially   Indications: 56 year old male recently underwent a right SFA to below-knee popliteal artery bypass with non-reversed ipsilateral greater saphenous vein.  On exam this morning was noted to have monophasic only venous signals at the level of the foot and duplex demonstrated thrombosed graft.  He was therefore indicated for take back to the operating room with thrombectomy and possible redo bypass of his right lower extremity.  Findings: The vein bypass was thrombosed.  Distally the artery was healthy and there was adequate backbleeding from the popliteal distally as well as antegrade bleeding that was minimal.  In the common femoral artery there was stable calcification in the SFA there was palpable pulse and strong antegrade bleeding and the lumen was free of thrombosis or disease.  I could not pass a Fogarty catheter through the vein bypass in either direction and so it was removed in total.  I then re-tunneled after anastomosing approximately but then had no pulsatility.  I subsequently re-tunneled medially on the leg and more of a subcutaneous plane and there was strong antegrade pulsatility.  I completion there were strong AT and PT signals that augmented with compression of the graft.   Procedure:  The patient was identified in the holding area and taken to the operating room where he was placed supine on the operating table and general endotracheal anesthesia was induced.  He was given  antibiotics and sterilely prepped and draped in the right lower extremity in the usual fashion and timeout called.  We began with reopening his below-knee popliteal incision where we encountered an obviously thrombosed vein bypass.  This was encircled with vessel loop as well as the popliteal artery above and below the bypass.  The patient was given 5000 units of heparin at this time.  We then removed our vein bypass from the anastomosis and demonstrated adequate back bleeding from the popliteal artery as well as minimal antegrade bleeding.  The vein bypass itself was thrombosed and the popliteal artery was clamped.  I attempted to pass first a 3 followed by 4 followed by a 2 Fogarty through the vein bypass but was unable to pass it further than 20 cm.  With this we elected to reopen the groin incision.  We dissected down to the common femoral and superficial femoral arteries where the bypass takeoff was.  We placed Vesseloops around these arteries and then clamped the SFA proximally distally and took down the anastomosis there.  We encountered acute clot in the vein bypass but the SFA itself was free of disease.  There was strong antegrade bleeding in the SFA.  We attempted to pass a Fogarty catheter through the vein bypass antegrade but again would not pass.  I then pulled on the vein bypass and it removed easily.  We then performed thrombectomy and flushed it and the vein appeared to be healthy.  We then reversed the vein and sewed it end to side to the common femoral artery.  Upon releasing the clamps there was strong pulsatile bleeding through the vein bypass and it  appeared very healthy again.  This was then tunneled in an anatomic plane making sure to maintain orientation.  Unfortunately upon pulling through the tunnel we then again had no pulsatility in the vein bypass.  With this I removed it from the tunnel and elected to tunnel it more in the subcutaneous plane medially on the thigh.  After tunneling it  we then had strong pulsatility.  We again had strong backbleeding from our popliteal artery distally and then sewed the vein in place end-to-side with 6-0 Prolene suture.  Prior to completing the anastomosis we allow flushing techniques antegrade and retrograde.  Upon completion there was a strong pulse in the bypass with flow in the popliteal artery distally that was continuous through diastole.  There were also strong signals at the anterior tibial and posterior tibial at the level of the ankle with an augmented with compression of the vein bypass.  Satisfied with this we obtained hemostasis in both of her wounds.  We did leave one drain in the middle of the thigh but the other 2 drains were removed from the groin and the below-knee popliteal incision.  The wounds were irrigated and the groin was closed in multiple layers of Vicryl and 4-0 Monocryl at the level of the skin.  The below-knee popliteal incision was closed with Vicryl followed by staples at the level of the skin.  The patient was allowed to wake from anesthesia having tolerated the procedure well without immediate complication.  All counts were correct at completion.  EBL 150 cc.    Sayana Salley C. Donzetta Matters, MD [facility] [place] Office: [phone] Pager: [phone]

## 2017-09-12 NOTE — Progress Notes (Addendum)
  Progress Note    09/12/2017 7:31 AM 2 Days Post-Op  Subjective:  Says he feels good and denies pain in his foot  Afebrile HR  48'G 500'B systolic 70% RA  Vitals:   09/11/17 1503 09/12/17 0348  BP: (!) 144/66 (!) 125/55  Pulse:  61  Resp: 14 13  Temp: 98 F (36.7 C) 98.4 F (36.9 C)  SpO2: 96% 96%    Physical Exam: Cardiac:  regular Lungs:  Non labored Incisions:  All are clean and dry Extremities:  Very faint right PT doppler signal; brisk popliteal bypass doppler signal; toe amp site is clean and dry   CBC    Component Value Date/Time   WBC 6.5 09/12/2017 0258   RBC 2.70 (L) 09/12/2017 0258   HGB 8.2 (L) 09/12/2017 0258   HCT 24.1 (L) 09/12/2017 0258   PLT 186 09/12/2017 0258   MCV 89.3 09/12/2017 0258   MCH 30.4 09/12/2017 0258   MCHC 34.0 09/12/2017 0258   RDW 12.1 09/12/2017 0258   LYMPHSABS 1.2 08/08/2017 1546   MONOABS 0.5 08/08/2017 1546   EOSABS 0.2 08/08/2017 1546   BASOSABS 0.1 08/08/2017 1546    BMET    Component Value Date/Time   NA 136 09/11/2017 0248   K 4.3 09/11/2017 0248   CL 106 09/11/2017 0248   CO2 22 09/11/2017 0248   GLUCOSE 235 (H) 09/11/2017 0248   BUN 30 (H) 09/11/2017 0248   CREATININE 1.59 (H) 09/11/2017 0248   CREATININE 1.52 (H) 02/07/2015 1151   CALCIUM 8.4 (L) 09/11/2017 0248   GFRNONAA 46 (L) 09/11/2017 0248   GFRNONAA 50 (L) 02/07/2015 1151   GFRAA 54 (L) 09/11/2017 0248   GFRAA 58 (L) 02/07/2015 1151    INR    Component Value Date/Time   INR 1.18 09/09/2017 1642     Intake/Output Summary (Last 24 hours) at 09/12/17 0731 Last data filed at 09/12/17 0300  Gross per 24 hour  Intake              840 ml  Output              630 ml  Net              210 ml     Assessment:  56 y.o. male is s/p:  1. Right superficial femoral artery to below-knee popliteal artery bypass with non-reversed translocated saphenous vein graft 2. Intraoperative arteriogram 3. Ray amputation of the right fourth and fifth  toes  2 Days Post-Op   Plan: -pt's doppler signals are not as strong as yesterday-will get stat arterial duplex.  I have talked to the vascular lab -may need to go to the OR so npo -wet to dry dressing on toe amp site for now   Leontine Locket, PA-C [facility] [phone] 09/12/2017 7:31 AM  I have independently interviewed and examiend patient and agree with PA assessment and plan above. Bypass graft is occluded by duplex. Will return to OR today.  Brandon C. Donzetta Matters, MD [facility] [place] Office: [phone] Pager: [phone]

## 2017-09-12 NOTE — Progress Notes (Signed)
PT Cancellation Note  Patient Details Name: Alexander [name] MRN: [id number] DOB: [birth year]   Cancelled Treatment:    Reason Eval/Treat Not Completed: (P) Medical issues which prohibited therapy (Results show RLE arterial Duplex- Femoral popliteal bypass graft occluded. Will hold off on PT due to return to OR this PM.)  Fransisca Connors, [place] 09/12/2017, 11:59 AM

## 2017-09-12 NOTE — Anesthesia Preprocedure Evaluation (Addendum)
Anesthesia Evaluation  Patient identified by MRN, date of birth, ID band Patient awake    Reviewed: Allergy & Precautions, NPO status , Patient's Chart, lab work & pertinent test results  Airway Mallampati: I  TM Distance: >3 FB Neck ROM: Full    Dental  (+) Poor Dentition, Chipped, Missing, Edentulous Upper, Dental Advisory Given   Pulmonary former smoker,    Pulmonary exam normal        Cardiovascular hypertension, Pt. on medications + CAD and + Cardiac Stents  Normal cardiovascular exam+ pacemaker      Neuro/Psych    GI/Hepatic   Endo/Other  diabetes, Type 2, Insulin Dependent  Renal/GU Renal InsufficiencyRenal disease     Musculoskeletal   Abdominal   Peds  Hematology  (+) anemia ,   Anesthesia Other Findings   Reproductive/Obstetrics                            Lab Results  Component Value Date   WBC 6.5 09/12/2017   HGB 8.2 (L) 09/12/2017   HCT 24.1 (L) 09/12/2017   MCV 89.3 09/12/2017   PLT 186 09/12/2017   Lab Results  Component Value Date   CREATININE 1.59 (H) 09/11/2017   BUN 30 (H) 09/11/2017   NA 136 09/11/2017   K 4.3 09/11/2017   CL 106 09/11/2017   CO2 22 09/11/2017    Anesthesia Physical  Anesthesia Plan  ASA: III  Anesthesia Plan: General   Post-op Pain Management:    Induction: Intravenous  PONV Risk Score and Plan: 2 and Ondansetron, Treatment may vary due to age or medical condition and Dexamethasone  Airway Management Planned: Oral ETT  Additional Equipment:   Intra-op Plan:   Post-operative Plan: Extubation in OR  Informed Consent: I have reviewed the patients History and Physical, chart, labs and discussed the procedure including the risks, benefits and alternatives for the proposed anesthesia with the patient or authorized representative who has indicated his/her understanding and acceptance.     Plan Discussed with: CRNA and  Surgeon  Anesthesia Plan Comments:         Anesthesia Quick Evaluation

## 2017-09-12 NOTE — Progress Notes (Signed)
OT Cancellation    09/12/17 1500  OT Visit Information  Last OT Received On 09/12/17  Reason Eval/Treat Not Completed Patient at procedure or test/ unavailable (OR)  [facility], OT/L  [phone] 09/12/2017

## 2017-09-12 NOTE — Progress Notes (Signed)
Preliminary results by tech - Right Lower Ext. Arterial Duplex Competed. The femoral-popliteal bypass graft appears occluded. Doppler/color flow is absent. Oda Cogan, BS, RDMS, RVT

## 2017-09-13 ENCOUNTER — Encounter (HOSPITAL_COMMUNITY): Payer: Self-pay | Admitting: Vascular Surgery

## 2017-09-13 LAB — CBC
HCT: 23.5 % — ABNORMAL LOW (ref 39.0–52.0)
Hemoglobin: 7.9 g/dL — ABNORMAL LOW (ref 13.0–17.0)
MCH: 30 pg (ref 26.0–34.0)
MCHC: 33.6 g/dL (ref 30.0–36.0)
MCV: 89.4 fL (ref 78.0–100.0)
PLATELETS: 220 10*3/uL (ref 150–400)
RBC: 2.63 MIL/uL — AB (ref 4.22–5.81)
RDW: 12 % (ref 11.5–15.5)
WBC: 8.5 10*3/uL (ref 4.0–10.5)

## 2017-09-13 LAB — GLUCOSE, CAPILLARY
GLUCOSE-CAPILLARY: 289 mg/dL — AB (ref 65–99)
Glucose-Capillary: 269 mg/dL — ABNORMAL HIGH (ref 65–99)
Glucose-Capillary: 333 mg/dL — ABNORMAL HIGH (ref 65–99)
Glucose-Capillary: 238 mg/dL — ABNORMAL HIGH (ref 65–99)

## 2017-09-13 LAB — HEPARIN LEVEL (UNFRACTIONATED): Heparin Unfractionated: 0.33 IU/mL (ref 0.30–0.70)

## 2017-09-13 MED ORDER — INSULIN ASPART PROT & ASPART (70-30 MIX) 100 UNIT/ML ~~LOC~~ SUSP
25.0000 [IU] | Freq: Two times a day (BID) | SUBCUTANEOUS | Status: DC
  Administered 2017-09-13 – 2017-09-16 (×6): 25 [IU] via SUBCUTANEOUS
  Filled 2017-09-13: qty 10

## 2017-09-13 MED ORDER — HEPARIN (PORCINE) IN NACL 100-0.45 UNIT/ML-% IJ SOLN
1650.0000 [IU]/h | INTRAMUSCULAR | Status: DC
  Administered 2017-09-13 – 2017-09-14 (×3): 1450 [IU]/h via INTRAVENOUS
  Filled 2017-09-13 (×3): qty 250

## 2017-09-13 MED ORDER — INSULIN ASPART PROT & ASPART (70-30 MIX) 100 UNIT/ML ~~LOC~~ SUSP
12.0000 [IU] | Freq: Once | SUBCUTANEOUS | Status: AC
  Administered 2017-09-13: 12 [IU] via SUBCUTANEOUS
  Filled 2017-09-13: qty 10

## 2017-09-13 MED ORDER — OXYCODONE-ACETAMINOPHEN 5-325 MG PO TABS: 1.0000 | ORAL_TABLET | Freq: Four times a day (QID) | ORAL | 0 refills | Status: DC | PRN

## 2017-09-13 NOTE — Anesthesia Postprocedure Evaluation (Signed)
Anesthesia Post Note  Patient: Alexander [name]  Procedure(s) Performed: Thrombectomy  & Redo Right Below Knee Popliteal Artery Bypass Graft   (Right Leg Lower)     Patient location during evaluation: PACU Anesthesia Type: General Level of consciousness: awake and alert Pain management: pain level controlled Vital Signs Assessment: post-procedure vital signs reviewed and stable Respiratory status: spontaneous breathing, nonlabored ventilation, respiratory function stable and patient connected to nasal cannula oxygen Cardiovascular status: blood pressure returned to baseline and stable Postop Assessment: no apparent nausea or vomiting Anesthetic complications: no    Last Vitals:  Vitals:   09/13/17 0924 09/13/17 0926  BP: (!) 153/66   Pulse:  68  Resp:    Temp:    SpO2:      Last Pain:  Vitals:   09/13/17 0830  TempSrc:   PainSc: 0-No pain                 Tiajuana Amass

## 2017-09-13 NOTE — Progress Notes (Signed)
OT Cancellation Note  Patient Details Name: Alexander [name] MRN: [id number] DOB: 03-10-1961   Cancelled Treatment:    Reason Eval/Treat Not Completed: Other (comment). Pt reports he does not want to get up on his RLE today due to having procedure yesterday that he feels it needs to heal and air out more. He is aware how important it is to get up to prevent blood clots and PNA.   Alexander [name] [phone] 09/13/2017, 11:42 AM

## 2017-09-13 NOTE — Care Management Important Message (Signed)
Important Message  Patient Details  Name: Alexander [name] MRN: [id number] Date of Birth: 06-21-1961   Medicare Important Message Given:  Yes    Mirl Hillery Abena 09/13/2017, 10:12 AM

## 2017-09-13 NOTE — Progress Notes (Signed)
ANTICOAGULATION CONSULT NOTE - Follow Up Consult  Pharmacy Consult for Heparin Indication: atrial fibrillation  Allergies  Allergen Reactions  . Sulfa Antibiotics Rash    Patient Measurements: Height: 6' (182.9 cm) Weight: 260 lb (117.9 kg) IBW/kg (Calculated) : 77.6 Heparin Dosing Weight:  103.3 kg  Vital Signs:    Labs:  Recent Labs  09/10/17 1422 09/11/17 0248 09/11/17 0836 09/12/17 0258 09/13/17 0257  HGB  --  8.6*  --  8.2* 7.9*  HCT  --  25.9*  --  24.1* 23.5*  PLT  --  178  --  186 220  APTT 51*  --   --   --   --   HEPARINUNFRC 0.31 0.36 0.38 0.43  --   CREATININE  --  1.59*  --   --   --     Estimated Creatinine Clearance: 67.1 mL/min (A) (by C-G formula based on SCr of 1.59 mg/dL (H)).   Assessment:  Anticoag: Apixaban PTA for afib (LD 10/26)>>hep gtt. Bypass graft is occluded by duplex so pt back to OR 11/1. Resume heparin 11/2. Post-op anemia with Hgb 7.9. MD aware.  Goal of Therapy:  Heparin level 0.3-0.7 units/ml Monitor platelets by anticoagulation protocol: Yes   Plan:  Resume IV heparin 1450 units/hr (no bolus) Check heparin level in 6 hrs. Daily HL and CBC Resume home insulin?   Alexander [name], PharmD, BCPS Clinical Staff Pharmacist Pager [phone]  Alexander [name] 09/13/2017,8:54 AM

## 2017-09-13 NOTE — Progress Notes (Signed)
Inpatient Diabetes Program Recommendations  AACE/ADA: New Consensus Statement on Inpatient Glycemic Control (2015)  Target Ranges:  Prepandial:   less than 140 mg/dL      Peak postprandial:   less than 180 mg/dL (1-2 hours)      Critically ill patients:  140 - 180 mg/dL   Lab Results  Component Value Date   GLUCAP 333 (H) 09/13/2017   HGBA1C 6.5 (H) 09/10/2017    Review of Glycemic Control Results for Alexander [name], Alexander [name] (MRN [id number]) as of 09/13/2017 11:48  Ref. Range 09/12/2017 14:13 09/12/2017 17:40 09/12/2017 21:00 09/13/2017 06:02 09/13/2017 11:11  Glucose-Capillary Latest Ref Range: 65 - 99 mg/dL 197 (H) 212 (H) 254 (H) 269 (H) 333 (H)   Diabetes history:DM 2 Outpatient Diabetes medications: 70/30 56 units QAM, 46 units QPM Current orders for Inpatient glycemic control: Novolog Moderate Correction 0-15 units tid + Novolog HS scale 0-5 units  Inpatient Diabetes Program Recommendations:    Spoke with Leontine Locket PA regarding elevated postprandial CBGs. Received verbal orders for 70/30 insulin mix 12 units for now and start 25 units bid starting ac supper meal. Spoke with RN Philippa Sicks regarding change in insulin orders.  Thank you, Nani Gasser. Malay Fantroy, RN, MSN, CDE  Diabetes Coordinator Inpatient Glycemic Control Team Team Pager [phone] (8am-5pm) 09/13/2017 11:51 AM

## 2017-09-13 NOTE — Progress Notes (Addendum)
ANTICOAGULATION CONSULT NOTE - Follow Up Consult  Pharmacy Consult for Heparin Indication: atrial fibrillation  Allergies  Allergen Reactions  . Sulfa Antibiotics Rash    Patient Measurements: Height: 6' (182.9 cm) Weight: 260 lb (117.9 kg) IBW/kg (Calculated) : 77.6 Heparin Dosing Weight:  103.3 kg  Vital Signs: BP: 148/58 (11/02 1648) Pulse Rate: 68 (11/02 0926)  Labs:  Recent Labs  09/11/17 0248 09/11/17 0836 09/12/17 0258 09/13/17 0257 09/13/17 1534  HGB 8.6*  --  8.2* 7.9*  --   HCT 25.9*  --  24.1* 23.5*  --   PLT 178  --  186 220  --   HEPARINUNFRC 0.36 0.38 0.43  --  0.33  CREATININE 1.59*  --   --   --   --     Estimated Creatinine Clearance: 67.1 mL/min (A) (by C-G formula based on SCr of 1.59 mg/dL (H)).   Assessment: 47 yom on apixaban PTA for afib, transitioned to heparin drip. Bypass graft is occluded, and pt back to OR 11/1. Heparin resumed 11/2 at previously therapeutic rate. Post-op anemia with Hgb 7.9, plt wnl. Heparin level remains therapeutic at 0.33. No bleed documented.  Goal of Therapy:  Heparin level 0.3-0.7 units/ml Monitor platelets by anticoagulation protocol: Yes   Plan:  Continue IV heparin at 1450 units/hr Confirmatory heparin level with AM labs Daily heparin level and CBC Monitor for s/sx bleeding   Elicia Lamp, PharmD, BCPS Clinical Pharmacist 09/13/2017 4:52 PM

## 2017-09-13 NOTE — Progress Notes (Signed)
Physical Therapy Treatment Patient Details Name: Alexander [name] MRN: [id number] DOB: 1960/12/19 Today's Date: 09/13/2017    History of Present Illness Pt adm with diabetic foot ulcer on R foot. Pt underwent amputation of rt 4th and 5th ray and RLE bpg on 10/30. 11/1 Results Femoral popliteal bypass graft occluded return to OR. PMH - PAD, L transmetatarsal amputation, dm, htn, ckd, afib, pacemaker.    PT Comments    Pt performed supine exercises and sat edge of bed x 10 min.  Pt refused transfer and gait activities this session.  Pt remains safe to return home at this time.  PLan next session for OOB mobility and stair training.   Follow Up Recommendations  Home health PT;Supervision for mobility/OOB     Equipment Recommendations  None recommended by PT    Recommendations for Other Services       Precautions / Restrictions Precautions Precautions: Fall Precaution Comments: pacemaker Required Braces or Orthoses: Other Brace/Splint Other Brace/Splint: Darco Restrictions Weight Bearing Restrictions: Yes RLE Weight Bearing: Weight bearing as tolerated RLE Partial Weight Bearing Percentage or Pounds:  on heel is darco shoe. Other Position/Activity Restrictions: on heel with Darco shoe    Mobility  Bed Mobility Overal bed mobility: Needs Assistance Bed Mobility: Supine to Sit;Sit to Supine     Supine to sit: Min guard Sit to supine: Min guard   General bed mobility comments: Tactile cueing to guide patient into and OOB.  Pt sat edge of bed x 10 min unassisted.    Transfers                 General transfer comment: Pt refused to stand or ambulate this session secondary to dressing being removed and not wanting to bear weight on his surgical foot.    Ambulation/Gait                 Stairs            Wheelchair Mobility    Modified Rankin (Stroke Patients Only)       Balance Overall balance assessment: Needs assistance   Sitting balance-Leahy  Scale: Good                                      Cognition Arousal/Alertness: Awake/alert Behavior During Therapy: WFL for tasks assessed/performed Overall Cognitive Status: Within Functional Limits for tasks assessed                                        Exercises General Exercises - Lower Extremity Ankle Circles/Pumps: AROM;20 reps;10 reps;Supine Quad Sets: AROM;10 reps;Both;Supine Heel Slides: AROM;Both;10 reps;Supine Hip ABduction/ADduction: AROM;Both;10 reps;Supine Straight Leg Raises: AROM;Both;10 reps;Supine    General Comments        Pertinent Vitals/Pain Pain Assessment: 0-10 Pain Score: 2  Pain Location: RLE Pain Descriptors / Indicators: Tingling (stinging) Pain Intervention(s): Monitored during session;Repositioned    Home Living                      Prior Function            PT Goals (current goals can now be found in the care plan section) Acute Rehab PT Goals Patient Stated Goal: return home Potential to Achieve Goals: Good Progress towards PT goals: Progressing toward goals  Frequency    Min 3X/week      PT Plan Current plan remains appropriate    Co-evaluation              AM-PAC PT "6 Clicks" Daily Activity  Outcome Measure  Difficulty turning over in bed (including adjusting bedclothes, sheets and blankets)?: A Little Difficulty moving from lying on back to sitting on the side of the bed? : A Little Difficulty sitting down on and standing up from a chair with arms (e.g., wheelchair, bedside commode, etc,.)?: Unable Help needed moving to and from a bed to chair (including a wheelchair)?: A Lot Help needed walking in hospital room?: A Lot Help needed climbing 3-5 steps with a railing? : A Lot 6 Click Score: 13    End of Session Equipment Utilized During Treatment: Gait belt Activity Tolerance: Patient limited by fatigue Patient left: in chair;with call bell/phone within reach Nurse  Communication: Mobility status PT Visit Diagnosis: Unsteadiness on feet (R26.81);Other abnormalities of gait and mobility (R26.89);Pain Pain - Right/Left: Right Pain - part of body: Leg     Time: 0950-1016 PT Time Calculation (min) (ACUTE ONLY): 26 min  Charges:  $Therapeutic Exercise: 8-22 mins $Therapeutic Activity: 8-22 mins                    G Codes:       Governor Rooks, PTA pager [phone]    Cristela Blue 09/13/2017, 12:36 PM

## 2017-09-13 NOTE — Progress Notes (Addendum)
  Progress Note    09/13/2017 7:19 AM 1 Day Post-Op  Subjective:  No complaints  Afebrile HR 60's-80's NSR 811'B-147'W systolic 29% RA  Vitals:   09/12/17 1825 09/12/17 2051  BP:  (!) 150/55  Pulse:  65  Resp:  13  Temp: (!) 97.3 F (36.3 C) 98.5 F (36.9 C)  SpO2:  98%    Physical Exam: Cardiac:  regular Lungs:  Non labored Incisions:  Bandages are clean and dry Extremities:  Brisk doppler signals right PT/AT   CBC    Component Value Date/Time   WBC 8.5 09/13/2017 0257   RBC 2.63 (L) 09/13/2017 0257   HGB 7.9 (L) 09/13/2017 0257   HCT 23.5 (L) 09/13/2017 0257   PLT 220 09/13/2017 0257   MCV 89.4 09/13/2017 0257   MCH 30.0 09/13/2017 0257   MCHC 33.6 09/13/2017 0257   RDW 12.0 09/13/2017 0257   LYMPHSABS 1.2 08/08/2017 1546   MONOABS 0.5 08/08/2017 1546   EOSABS 0.2 08/08/2017 1546   BASOSABS 0.1 08/08/2017 1546    BMET    Component Value Date/Time   NA 136 09/11/2017 0248   K 4.3 09/11/2017 0248   CL 106 09/11/2017 0248   CO2 22 09/11/2017 0248   GLUCOSE 235 (H) 09/11/2017 0248   BUN 30 (H) 09/11/2017 0248   CREATININE 1.59 (H) 09/11/2017 0248   CREATININE 1.52 (H) 02/07/2015 1151   CALCIUM 8.4 (L) 09/11/2017 0248   GFRNONAA 46 (L) 09/11/2017 0248   GFRNONAA 50 (L) 02/07/2015 1151   GFRAA 54 (L) 09/11/2017 0248   GFRAA 58 (L) 02/07/2015 1151    INR    Component Value Date/Time   INR 1.18 09/09/2017 1642     Intake/Output Summary (Last 24 hours) at 09/13/17 0719 Last data filed at 09/13/17 0600  Gross per 24 hour  Intake             1000 ml  Output              745 ml  Net              255 ml     Assessment:  56 y.o. male is s/p:  1.  Right superficial femoral artery to below-knee popliteal artery bypass with non-reversed translocated saphenous vein graft 2.  Intraoperative arteriogram 3.  Ray amputation of the right fourth and fifth toes and 1.  Re-exposure of right groin and below knee popliteal incisions less than 30 days 2.   Thrombectomy of right superficial femoral to popliteal vein bypass 3.  Re-do sfa to below knee bypass with reversed greater saphenous vein tunneled medially   1 Day Post-Op  Plan: -pt with brisk doppler signals this morning -will start therapeutic heparin gtt -acute blood loss anemia-hgb is 7.9-tolerating - will check again in the am -check bmp tomorrow -continue mobilization    Leontine Locket, PA-C [facility] [phone] 09/13/2017 7:19 AM     I have independently interviewed and examined patient and agree with PA assessment and plan above.  Ok to mobilize and initiate heparin drip. D/c medial thigh drain.  Eltha Tingley C. Donzetta Matters, MD [facility] [place] Office: [phone] Pager: [phone]

## 2017-09-14 LAB — GLUCOSE, CAPILLARY
GLUCOSE-CAPILLARY: 261 mg/dL — AB (ref 65–99)
GLUCOSE-CAPILLARY: 264 mg/dL — AB (ref 65–99)
Glucose-Capillary: 218 mg/dL — ABNORMAL HIGH (ref 65–99)
Glucose-Capillary: 248 mg/dL — ABNORMAL HIGH (ref 65–99)

## 2017-09-14 LAB — PREPARE RBC (CROSSMATCH)

## 2017-09-14 LAB — BASIC METABOLIC PANEL
ANION GAP: 8 (ref 5–15)
BUN: 65 mg/dL — ABNORMAL HIGH (ref 6–20)
CALCIUM: 8.3 mg/dL — AB (ref 8.9–10.3)
CO2: 20 mmol/L — ABNORMAL LOW (ref 22–32)
Chloride: 105 mmol/L (ref 101–111)
Creatinine, Ser: 2.28 mg/dL — ABNORMAL HIGH (ref 0.61–1.24)
GFR, EST AFRICAN AMERICAN: 35 mL/min — AB (ref >60)
GFR, EST NON AFRICAN AMERICAN: 30 mL/min — AB (ref >60)
GLUCOSE: 254 mg/dL — AB (ref 65–99)
Potassium: 4.7 mmol/L (ref 3.5–5.1)
SODIUM: 133 mmol/L — AB (ref 135–145)

## 2017-09-14 LAB — CBC
HCT: 21.4 % — ABNORMAL LOW (ref 39.0–52.0)
HCT: 25.5 % — ABNORMAL LOW (ref 39.0–52.0)
Hemoglobin: 7.1 g/dL — ABNORMAL LOW (ref 13.0–17.0)
Hemoglobin: 8.5 g/dL — ABNORMAL LOW (ref 13.0–17.0)
MCH: 29.6 pg (ref 26.0–34.0)
MCH: 29.8 pg (ref 26.0–34.0)
MCHC: 33.2 g/dL (ref 30.0–36.0)
MCHC: 33.3 g/dL (ref 30.0–36.0)
MCV: 89.2 fL (ref 78.0–100.0)
MCV: 89.5 fL (ref 78.0–100.0)
PLATELETS: 248 10*3/uL (ref 150–400)
PLATELETS: 222 10*3/uL (ref 150–400)
RBC: 2.4 MIL/uL — ABNORMAL LOW (ref 4.22–5.81)
RBC: 2.85 MIL/uL — AB (ref 4.22–5.81)
RDW: 12.2 % (ref 11.5–15.5)
RDW: 12.4 % (ref 11.5–15.5)
WBC: 7.2 10*3/uL (ref 4.0–10.5)
WBC: 6.8 10*3/uL (ref 4.0–10.5)

## 2017-09-14 LAB — HEPARIN LEVEL (UNFRACTIONATED): HEPARIN UNFRACTIONATED: 0.39 [IU]/mL (ref 0.30–0.70)

## 2017-09-14 MED ORDER — SODIUM CHLORIDE 0.9 % IV SOLN: Freq: Once | INTRAVENOUS | Status: DC

## 2017-09-14 NOTE — Plan of Care (Signed)
Problem: Pain Managment: Goal: General experience of comfort will improve Outcome: Progressing Pain management discussed with pt.; decrease pain level.

## 2017-09-14 NOTE — Progress Notes (Signed)
PT Cancellation Note  Patient Details Name: Alexander [name] MRN: [id number] DOB: October 12, 1961   Cancelled Treatment:    Reason Eval/Treat Not Completed: Patient declined, no reason specified  Adamantly refuses PT. Encouraged mobility to optimize recovery and prevent secondary complications. States he plans to have 3 friends "carry" him up his steps into his home at discharge   Ellouise Newer 09/14/2017, 3:20 PM  [place], [place]

## 2017-09-14 NOTE — Progress Notes (Signed)
  Progress Note    09/14/2017 3:56 PM 2 Days Post-Op  Subjective:  Doing well  Vitals:   09/14/17 1336 09/14/17 1356  BP: (!) 127/56 (!) 130/50  Pulse: 66   Resp: 16   Temp: 98.4 F (36.9 C) 98.3 F (36.8 C)  SpO2: 100% 100%    Physical Exam: aaox3 Abdomen is soft Right groin soft, incision cdi Bandages on right leg are clean Strong dp/pt signals Toe amp site is healing  CBC    Component Value Date/Time   WBC 7.2 09/14/2017 0401   RBC 2.40 (L) 09/14/2017 0401   HGB 7.1 (L) 09/14/2017 0401   HCT 21.4 (L) 09/14/2017 0401   PLT 248 09/14/2017 0401   MCV 89.2 09/14/2017 0401   MCH 29.6 09/14/2017 0401   MCHC 33.2 09/14/2017 0401   RDW 12.2 09/14/2017 0401   LYMPHSABS 1.2 08/08/2017 1546   MONOABS 0.5 08/08/2017 1546   EOSABS 0.2 08/08/2017 1546   BASOSABS 0.1 08/08/2017 1546    BMET    Component Value Date/Time   NA 133 (L) 09/14/2017 0401   K 4.7 09/14/2017 0401   CL 105 09/14/2017 0401   CO2 20 (L) 09/14/2017 0401   GLUCOSE 254 (H) 09/14/2017 0401   BUN 65 (H) 09/14/2017 0401   CREATININE 2.28 (H) 09/14/2017 0401   CREATININE 1.52 (H) 02/07/2015 1151   CALCIUM 8.3 (L) 09/14/2017 0401   GFRNONAA 30 (L) 09/14/2017 0401   GFRNONAA 50 (L) 02/07/2015 1151   GFRAA 35 (L) 09/14/2017 0401   GFRAA 58 (L) 02/07/2015 1151    INR    Component Value Date/Time   INR 1.18 09/09/2017 1642     Intake/Output Summary (Last 24 hours) at 09/14/17 1556 Last data filed at 09/14/17 1336  Gross per 24 hour  Intake            489.5 ml  Output              600 ml  Net           -110.5 ml     Assessment:  56 y.o. male is s/p right fem-pop bypass with vein, 5th toe amputation  Plan: oob with darko shoe On therapeutic heparin, will transition to home eliquis prior to dc Transfuse 2 units prbc's   Nyron Mozer C. Donzetta Matters, MD [facility] [place] Office: [phone] Pager: [phone]  09/14/2017 3:56 PM

## 2017-09-14 NOTE — Progress Notes (Signed)
ANTICOAGULATION CONSULT NOTE - Follow Up Consult  Pharmacy Consult for Heparin Indication: atrial fibrillation  Allergies  Allergen Reactions  . Sulfa Antibiotics Rash    Patient Measurements: Height: 6' (182.9 cm) Weight: 260 lb (117.9 kg) IBW/kg (Calculated) : 77.6 Heparin Dosing Weight: 103.3 kg  Vital Signs: Temp: 97.7 F (36.5 C) (11/03 0628) Temp Source: Oral (11/03 0628) BP: 138/62 (11/03 0948)  Labs:  Recent Labs  09/12/17 0258 09/13/17 0257 09/13/17 1534 09/14/17 0401  HGB 8.2* 7.9*  --  7.1*  HCT 24.1* 23.5*  --  21.4*  PLT 186 220  --  248  HEPARINUNFRC 0.43  --  0.33 0.39  CREATININE  --   --   --  2.28*    Estimated Creatinine Clearance: 46.8 mL/min (A) (by C-G formula based on SCr of 2.28 mg/dL (H)).  Assessment:  Anticoag: Apixaban PTA for afib (LD 10/26)>>hep gtt. Bypass graft is occluded by duplex so pt back to OR 11/1. Resumed heparin 11/2. Post-op anemia with Hgb 7.9>7.1 today. Plts ok. HL 0.39 in goal.  Goal of Therapy:  Heparin level 0.3-0.7 units/ml Monitor platelets by anticoagulation protocol: Yes   Plan:  Continue IV heparin at 1450 units/hr Daily heparin level and CBC F/u need for transfusion Watch Scr on ACEI-MD sticky note  Ibrahim Mcpheeters S. Alford Highland, PharmD, BCPS Clinical Staff Pharmacist Pager [phone]  [place], [place] 09/14/2017,11:12 AM

## 2017-09-15 ENCOUNTER — Other Ambulatory Visit: Payer: Self-pay

## 2017-09-15 LAB — GLUCOSE, CAPILLARY
GLUCOSE-CAPILLARY: 234 mg/dL — AB (ref 65–99)
GLUCOSE-CAPILLARY: 218 mg/dL — AB (ref 65–99)
Glucose-Capillary: 349 mg/dL — ABNORMAL HIGH (ref 65–99)
Glucose-Capillary: 242 mg/dL — ABNORMAL HIGH (ref 65–99)

## 2017-09-15 LAB — BASIC METABOLIC PANEL
Anion gap: 7 (ref 5–15)
BUN: 59 mg/dL — AB (ref 6–20)
CHLORIDE: 109 mmol/L (ref 101–111)
CO2: 20 mmol/L — ABNORMAL LOW (ref 22–32)
CREATININE: 1.92 mg/dL — AB (ref 0.61–1.24)
Calcium: 8.3 mg/dL — ABNORMAL LOW (ref 8.9–10.3)
GFR, EST AFRICAN AMERICAN: 43 mL/min — AB (ref >60)
GFR, EST NON AFRICAN AMERICAN: 37 mL/min — AB (ref >60)
Glucose, Bld: 232 mg/dL — ABNORMAL HIGH (ref 65–99)
Potassium: 4.6 mmol/L (ref 3.5–5.1)
SODIUM: 136 mmol/L (ref 135–145)

## 2017-09-15 LAB — TYPE AND SCREEN
ABO/RH(D): A POS
Antibody Screen: NEGATIVE
UNIT DIVISION: 0
Unit division: 0

## 2017-09-15 LAB — HEPARIN LEVEL (UNFRACTIONATED): HEPARIN UNFRACTIONATED: 0.26 [IU]/mL — AB (ref 0.30–0.70)

## 2017-09-15 LAB — BPAM RBC
Blood Product Expiration Date: 201811152359
Blood Product Expiration Date: 201811152359
ISSUE DATE / TIME: 201811031654
ISSUE DATE / TIME: 201811031324
UNIT TYPE AND RH: 6200
Unit Type and Rh: 6200

## 2017-09-15 MED ORDER — APIXABAN 5 MG PO TABS
5.0000 mg | ORAL_TABLET | Freq: Two times a day (BID) | ORAL | Status: DC
  Administered 2017-09-15 – 2017-09-16 (×3): 5 mg via ORAL
  Filled 2017-09-15 (×3): qty 1

## 2017-09-15 NOTE — Progress Notes (Signed)
ANTICOAGULATION CONSULT NOTE - Follow Up Consult  Pharmacy Consult for Heparin Indication: atrial fibrillation  Allergies  Allergen Reactions  . Sulfa Antibiotics Rash   Patient Measurements: Height: 6' (182.9 cm) Weight: 260 lb (117.9 kg) IBW/kg (Calculated) : 77.6 Heparin Dosing Weight: 103.3 kg  Vital Signs: Temp: 97.9 F (36.6 C) (11/03 1949) Temp Source: Oral (11/03 1949) BP: 139/53 (11/03 1949) Pulse Rate: 68 (11/03 1724)  Labs: Recent Labs    09/13/17 0257 09/13/17 1534 09/14/17 0401 09/14/17 2215 09/15/17 0150  HGB 7.9*  --  7.1* 8.5*  --   HCT 23.5*  --  21.4* 25.5*  --   PLT 220  --  248 222  --   HEPARINUNFRC  --  0.33 0.39  --  0.26*  CREATININE  --   --  2.28*  --  1.92*    Estimated Creatinine Clearance: 55.6 mL/min (A) (by C-G formula based on SCr of 1.92 mg/dL (H)).  Assessment: 52 yom on apixaban PTA for afib, transitioned to heparin drip per pharmacy. Last dose of apixaban 10/26. S/p vascular surgery on 11/1 for thrombectomy and redo right fem-pop artery bypass graft with toe amputation. Patient also received 2 units PRBCs on 11/3 for hgb 7.1.   Heparin level slightly subtherapeutic at 0.26 and no infusion issues per RN. CBC stable s/p blood products and no overt s/s bleeding documented.   Goal of Therapy:  Heparin level 0.3-0.7 units/ml Monitor platelets by anticoagulation protocol: Yes   Plan:  Increase heparin gtt to 1650 units/hr  Heparin level in 6 hrs Daily heparin level and CBC Monitor for s/s bleeding F/u resume oral AC  Lavonda Jumbo, PharmD Clinical Pharmacist 09/15/17 3:53 AM

## 2017-09-15 NOTE — Progress Notes (Signed)
[facility] for Eliquis Indication: atrial fibrillation  Allergies  Allergen Reactions  . Sulfa Antibiotics Rash   Patient Measurements: Height: 6' (182.9 cm) Weight: 260 lb (117.9 kg) IBW/kg (Calculated) : 77.6 Heparin Dosing Weight: 103.3 kg  Vital Signs: Temp: 98.3 F (36.8 C) (11/04 0412) Temp Source: Oral (11/04 0412) BP: 148/62 (11/04 0412) Pulse Rate: 62 (11/04 0412)  Labs: Recent Labs    09/13/17 0257 09/13/17 1534 09/14/17 0401 09/14/17 2215 09/15/17 0150  HGB 7.9*  --  7.1* 8.5*  --   HCT 23.5*  --  21.4* 25.5*  --   PLT 220  --  248 222  --   HEPARINUNFRC  --  0.33 0.39  --  0.26*  CREATININE  --   --  2.28*  --  1.92*    Estimated Creatinine Clearance: 55.6 mL/min (A) (by C-G formula based on SCr of 1.92 mg/dL (H)).  Assessment: 23 yom on apixaban PTA for afib, transitioned to heparin drip per pharmacy. Last dose of apixaban 10/26. S/p vascular surgery on 11/1 for thrombectomy and redo right fem-pop artery bypass graft with toe amputation. Patient also received 2 units PRBCs on 11/3 for hgb 7.1.   Pt to be transitioned back to apixaban today. Instructed RN to give first dose of apixaban this AM and discontinue heparin gtt at that time.   Goal of Therapy:  Heparin level 0.3-0.7 units/ml Monitor platelets by anticoagulation protocol: Yes   Plan:  Discontinue heparin gtt  Begin apixaban 5mg  po BID Monitor for s/s bleeding  Leroy Libman, PharmD Pharmacy Resident Pager: [phone]

## 2017-09-15 NOTE — Progress Notes (Signed)
  Progress Note    09/15/2017 10:10 AM 3 Days Post-Op  Subjective: feels good today  Vitals:   09/14/17 1949 09/15/17 0412  BP: (!) 139/53 (!) 148/62  Pulse:  62  Resp: 18 10  Temp: 97.9 F (36.6 C) 98.3 F (36.8 C)  SpO2: 99% 99%    Physical Exam: aaox3 Abdomen is soft Right groin soft, incision cdi Right leg incisions are cdi Strong dp/pt signals on right 2+ pitting edema Toe amp site is healing with dessication medially    CBC    Component Value Date/Time   WBC 6.8 09/14/2017 2215   RBC 2.85 (L) 09/14/2017 2215   HGB 8.5 (L) 09/14/2017 2215   HCT 25.5 (L) 09/14/2017 2215   PLT 222 09/14/2017 2215   MCV 89.5 09/14/2017 2215   MCH 29.8 09/14/2017 2215   MCHC 33.3 09/14/2017 2215   RDW 12.4 09/14/2017 2215   LYMPHSABS 1.2 08/08/2017 1546   MONOABS 0.5 08/08/2017 1546   EOSABS 0.2 08/08/2017 1546   BASOSABS 0.1 08/08/2017 1546    BMET    Component Value Date/Time   NA 136 09/15/2017 0150   K 4.6 09/15/2017 0150   CL 109 09/15/2017 0150   CO2 20 (L) 09/15/2017 0150   GLUCOSE 232 (H) 09/15/2017 0150   BUN 59 (H) 09/15/2017 0150   CREATININE 1.92 (H) 09/15/2017 0150   CREATININE 1.52 (H) 02/07/2015 1151   CALCIUM 8.3 (L) 09/15/2017 0150   GFRNONAA 37 (L) 09/15/2017 0150   GFRNONAA 50 (L) 02/07/2015 1151   GFRAA 43 (L) 09/15/2017 0150   GFRAA 58 (L) 02/07/2015 1151    INR    Component Value Date/Time   INR 1.18 09/09/2017 1642     Intake/Output Summary (Last 24 hours) at 09/15/2017 1010 Last data filed at 09/15/2017 0600 Gross per 24 hour  Intake 1700 ml  Output 2225 ml  Net -525 ml     Assessment:  56 y.o. male is s/p right fem-pop bypass with vein, 5th toe amputation. Transfused yesterday  Plan: oob with darko shoe today On therapeutic heparin, initiate eliquis today Diet as tolerates Dry dressing to right foot with hydrogel   Teagyn Fishel C. Donzetta Matters, MD [facility] [place] Office: [phone] Pager:  [phone]  09/15/2017 10:10 AM

## 2017-09-16 ENCOUNTER — Inpatient Hospital Stay (HOSPITAL_COMMUNITY): Payer: Medicare Other

## 2017-09-16 DIAGNOSIS — I739 Peripheral vascular disease, unspecified: Secondary | ICD-10-CM

## 2017-09-16 LAB — BASIC METABOLIC PANEL
Anion gap: 6 (ref 5–15)
BUN: 46 mg/dL — AB (ref 6–20)
CHLORIDE: 110 mmol/L (ref 101–111)
CO2: 21 mmol/L — AB (ref 22–32)
CREATININE: 1.68 mg/dL — AB (ref 0.61–1.24)
Calcium: 8.3 mg/dL — ABNORMAL LOW (ref 8.9–10.3)
GFR calc Af Amer: 50 mL/min — ABNORMAL LOW (ref >60)
GFR calc non Af Amer: 43 mL/min — ABNORMAL LOW (ref >60)
GLUCOSE: 240 mg/dL — AB (ref 65–99)
Potassium: 4.3 mmol/L (ref 3.5–5.1)
Sodium: 137 mmol/L (ref 135–145)

## 2017-09-16 LAB — CBC
HEMATOCRIT: 25.3 % — AB (ref 39.0–52.0)
HEMOGLOBIN: 8.2 g/dL — AB (ref 13.0–17.0)
MCH: 28.9 pg (ref 26.0–34.0)
MCHC: 32.4 g/dL (ref 30.0–36.0)
MCV: 89.1 fL (ref 78.0–100.0)
Platelets: 245 10*3/uL (ref 150–400)
RBC: 2.84 MIL/uL — ABNORMAL LOW (ref 4.22–5.81)
RDW: 12.4 % (ref 11.5–15.5)
WBC: 6.5 10*3/uL (ref 4.0–10.5)

## 2017-09-16 LAB — GLUCOSE, CAPILLARY
Glucose-Capillary: 240 mg/dL — ABNORMAL HIGH (ref 65–99)
Glucose-Capillary: 244 mg/dL — ABNORMAL HIGH (ref 65–99)

## 2017-09-16 MED ORDER — CARRASYN HYDROGEL WOUND DRESS EX GEL: CUTANEOUS | 0 refills | Status: DC | PRN

## 2017-09-16 NOTE — Progress Notes (Signed)
   VASCULAR SURGERY ASSESSMENT & PLAN:   His right femoropopliteal bypass graft there is patent. Appreciate Dr. Claretha Cooper help  Brisk Doppler flow in the right foot. His toe amputation site is healing nicely.  He is back on his Eliquis.   Home today.   SUBJECTIVE:   No complaints  PHYSICAL EXAM:   Vitals:   09/15/17 1953 09/16/17 0450 09/16/17 0632 09/16/17 0833  BP: (!) 127/54 (!) 152/62  (!) 152/61  Pulse: 71 67 (!) 31 62  Resp: 13 14 17 11   Temp: 98.4 F (36.9 C) 97.9 F (36.6 C)  97.9 F (36.6 C)  TempSrc: Oral Oral  Oral  SpO2: 97%  100% 97%  Weight:      Height:       Brisk right posterior tibial and anterior tibial signal. To amputation site looks fine without erythema or drainage.  LABS:   Lab Results  Component Value Date   WBC 6.5 09/16/2017   HGB 8.2 (L) 09/16/2017   HCT 25.3 (L) 09/16/2017   MCV 89.1 09/16/2017   PLT 245 09/16/2017   Lab Results  Component Value Date   CREATININE 1.68 (H) 09/16/2017   Lab Results  Component Value Date   INR 1.18 09/09/2017   CBG (last 3)  Recent Labs    09/15/17 1646 09/15/17 2105 09/16/17 0632  GLUCAP 349* 242* 240*    PROBLEM LIST:    Active Problems:   Atherosclerotic PVD with ulceration (HCC)   CURRENT MEDS:   . amLODipine  10 mg Oral Daily  . apixaban  5 mg Oral BID  . aspirin  81 mg Oral Daily  . atorvastatin  40 mg Oral Daily  . chlorthalidone  50 mg Oral Daily  . cilostazol  100 mg Oral BID AC  . docusate sodium  100 mg Oral Daily  . hydrALAZINE  100 mg Oral TID  . insulin aspart  0-15 Units Subcutaneous TID WC  . insulin aspart  0-5 Units Subcutaneous QHS  . insulin aspart protamine- aspart  25 Units Subcutaneous BID WC  . lisinopril  10 mg Oral Daily  . metoprolol tartrate  100 mg Oral BID  . multivitamin with minerals  1 tablet Oral Daily  . pantoprazole  40 mg Oral Daily  . prazosin  2 mg Oral QPM    [name] Beeper: [phone] Office:  [phone] 09/16/2017

## 2017-09-16 NOTE — Progress Notes (Addendum)
VASCULAR LAB PRELIMINARY  ARTERIAL  ABI completed:    RIGHT    LEFT    PRESSURE WAVEFORM  PRESSURE WAVEFORM  BRACHIAL 161 Triphasic BRACHIAL 162 Triphasic  AT 115 Monophasic AT 123 Triphasic  PT 139 Monophasic PT 127 Triphasic    RIGHT LEFT  ABI 0.86 0.78   ABIs indicate a mild reduction on the right and a moderate reduction on the left ar rest  Retia Cordle, RVS 09/16/2017, 1:28 PM

## 2017-09-16 NOTE — Progress Notes (Signed)
OT Cancellation Note  Patient Details Name: Alexander [name] MRN: [id number] DOB: [birth year]   Cancelled Treatment:    Reason Eval/Treat Not Completed: Other (comment): Pt politely declined to participate in OT reporting that he is going home today and would like to rest until then. Encouraged increased activity and participation in ADL tasks once at home. Will continue to follow while admitted.  Norman Herrlich, MS OTR/L  Pager: [place] A [name] 09/16/2017, 11:38 AM

## 2017-09-16 NOTE — Progress Notes (Signed)
Late Entry  Patient had ABI's completed prior to discharge.  [name], RN

## 2017-09-16 NOTE — Progress Notes (Signed)
PT Cancellation Note  Patient Details Name: LEONDRE TAUL MRN: [id number] DOB: 03/16/1961   Cancelled Treatment:    Reason Eval/Treat Not Completed: Other (comment). Pt reported he had been up in room yesterday and felt comfortable mobilizing. Reports he want to wait to amb until he goes home today.    Shary Decamp [place] 09/16/2017, 11:05 AM Suanne Marker PT [phone]

## 2017-09-16 NOTE — Care Management Note (Signed)
Case Management Note Marvetta Gibbons RN, BSN Unit 4E-Case Manager [phone]  Patient Details  Name: Alexander [name] MRN: [id number] Date of Birth: 1961/10/04  Subjective/Objective:    Pt admitted with PVD s/p  Fem-pop bypass graft- plan to return to OR on 09/12/17 for revision due to graft occluded.               Action/Plan: PTA pt lived at home with spouse- Per PT eval recommendations for [facility]- will need HH orders prior to discharge- CM to follow.   Expected Discharge Date:  09/16/17               Expected Discharge Plan:  [place]  In-House Referral:     Discharge planning Services  CM Consult  Post Acute Care Choice:  NA Choice offered to:  NA  DME Arranged:    DME Agency:     HH Arranged:    [place] Agency:     Status of Service:  Completed, signed off  If discussed at [employer] of Stay Meetings, dates discussed:    Discharge Disposition: home/self care   Additional Comments:  09/16/17- 1245- Alleyne Lac RN, CM-- pt for d/c home today- pt declined [place] services- no DME recommended.   Dawayne Patricia, RN 09/16/2017, 12:45 PM

## 2017-09-16 NOTE — Progress Notes (Signed)
Patient to be discharged to home with family. PIV x1 removed. Telemetry removed per protocol and CCMD notified. Discharge instructions, follow up appts, medications, and incisional care reviewed with patient and patient's spouse. All prescriptions given to patient. All belongings returned to patient. Reviewed dressing change for right amputated toes with patient's daughter who stated she would be doing them at home - stated understanding. All questions answered.  [name], RN

## 2017-09-18 ENCOUNTER — Encounter: Payer: Self-pay | Admitting: Vascular Surgery

## 2017-09-18 ENCOUNTER — Ambulatory Visit (INDEPENDENT_AMBULATORY_CARE_PROVIDER_SITE_OTHER): Payer: Self-pay | Admitting: Vascular Surgery

## 2017-09-18 VITALS — BP 136/68 | HR 68 | Temp 98.5°F | Resp 16 | Ht 72.0 in | Wt 260.0 lb

## 2017-09-18 DIAGNOSIS — Z48812 Encounter for surgical aftercare following surgery on the circulatory system: Secondary | ICD-10-CM

## 2017-09-18 MED ORDER — CEPHALEXIN 500 MG PO CAPS: 500.0000 mg | ORAL_CAPSULE | Freq: Three times a day (TID) | ORAL | 1 refills | Status: AC

## 2017-09-18 NOTE — Discharge Summary (Signed)
[facility] Discharge Summary   Patient ID:  Alexander [name] MRN: [id number] DOB/AGE: 58-Jan-1962 58 y.o.  Admit date: 09/09/2017 Discharge date: 09/16/2017 Date of Surgery: 09/12/2017 Surgeon: Surgeon(s): Waynetta Sandy, MD  Admission Diagnosis: Atherosclerotic PVD with ulceration ([place]) [I70.209, L98.499]  Discharge Diagnoses:  Atherosclerotic PVD with ulceration ([place]) [I70.209, L98.499]  Secondary Diagnoses: Past Medical History:  Diagnosis Date  . Alcohol abuse   . Anemia   . Arthritis   . Atrial fibrillation ([place])   . Cardiac syncope 10/07/14   rapid A fib with 8 sec pauses on converison with syncope- temp pacing wire placed then PPM  . Cataract    BILATERAL  . Chronic kidney disease   . Coronary artery disease   . Diabetes mellitus    dx---been  awhile  . Hyperlipemia   . Hypertension   . Peripheral vascular disease ([place])   . Presence of permanent cardiac pacemaker 10/08/2014    Procedure(s): Thrombectomy  & Redo Right Below Knee Popliteal Artery Bypass Graft    Discharged Condition: stable  HPI: Alexander [name] is a 56 y.o. male,  who is back for follow-up with new right foot ulcer that started 3-4 days ago. He is status post left femoral to tibioperoneal trunk bypass on 10/21/2013 by Dr. Scot Dock. This was done for a nonhealing wound which ultimately required a transmetatarsal amputation performed by Dr. Berenice Primas. The patient also had a wound on his right leg and on 11/08/2015 I performed atherectomy with drug coated balloon angioplasty for a tandem above-the-knee popliteal stenosis. On 10/10/2016 he presented with a high-grade stenosis in his right popliteal artery as well as rest pain and Dr. Donzetta Matters performed drug coated balloon and plasty as well as atherectomy of the right superficial femoral and popliteal artery.   Approximate 3 weeks ago he began experiencing worsening pain in his right leg especially with walking. He describes his  leg is feeling tight and if he is walking on a rock on his heel. He does get cramping with short distance walking. He feels his leg is cold to the touch. He went to the emergency department. A CT angiogram of the right leg was performed which showed popliteal artery occlusion.  He saw Dr. Trula Slade 08/12/2017 He was given a walking program, adding cilostazol, and a 6 week follow up appointment.    Vein mapping in progress today right LE Right LE saphenous vein mapping 09/04/2017 0.82 Femoral junction to 0.28 distal calf.  CT angiogram of the right leg was performed which showed popliteal artery occlusion.  08/08/2017  ASSESSMENT:   Right popliteal occlusion Claudication , non healing wound right foot and rest pain.     Hospital Course:  Alexander [name] is a 56 y.o. male is S/P  09/10/2017 1. Right superficial femoral artery to below-knee popliteal artery bypass with non-reversed translocated saphenous vein graft 2. Intraoperative arteriogram 3. Ray amputation of the right fourth and fifth toes    Procedure(s): Thrombectomy  & Redo Right Below Knee Popliteal Artery Bypass Graft    Assessment/Planning: POD # 1 1. Right superficial femoral artery to below-knee popliteal artery bypass with non-reversed translocated saphenous vein graft 2. Intraoperative arteriogram 3. Ray amputation of the right fourth and fifth toes  Will order Darco shoe for heel weight bearing Will maintain drains today PT for mobility Pending ABI's Will start Eliquis before he is discharged home  POD#2 Assessment:  56 y.o. male is s/p:  1. Right superficial femoral artery to below-knee  popliteal artery bypass with non-reversed translocated saphenous vein graft 2. Intraoperative arteriogram 3. Ray amputation of the right fourth and fifth toes  2 Days Post-Op   Plan: -pt's doppler signals are not as strong as yesterday-will get stat arterial duplex.  I have talked to the vascular  lab -may need to go to the OR so npo -wet to dry dressing on toe amp site for now  Dr. Donzetta Matters:  I have independently interviewed and examiend patient and agree with PA assessment and plan above. Bypass graft is occluded by duplex. Will return to OR today.  Plan: POD#1 revision by pass -pt with brisk doppler signals this morning -will start therapeutic heparin gtt -acute blood loss anemia-hgb is 7.9-tolerating - will check again in the am -check bmp tomorrow -continue mobilization  Plan: POD#2 revision by pass oob with darko shoe On therapeutic heparin, will transition to home eliquis prior to dc Transfuse 2 units prbc's   Plan:POD#3 revision by pass oob with darko shoe today On therapeutic heparin, initiate eliquis today Diet as tolerates Dry dressing to right foot with hydrogel  His right femoropopliteal bypass graft there is patent. Appreciate Dr. Claretha Cooper help  Brisk Doppler flow in the right foot. His toe amputation site is healing nicely.  He is back on his Eliquis.   Home today.        Consults:    Significant Diagnostic Studies: CBC Lab Results  Component Value Date   WBC 6.5 09/16/2017   HGB 8.2 (L) 09/16/2017   HCT 25.3 (L) 09/16/2017   MCV 89.1 09/16/2017   PLT 245 09/16/2017    BMET    Component Value Date/Time   NA 137 09/16/2017 0232   K 4.3 09/16/2017 0232   CL 110 09/16/2017 0232   CO2 21 (L) 09/16/2017 0232   GLUCOSE 240 (H) 09/16/2017 0232   BUN 46 (H) 09/16/2017 0232   CREATININE 1.68 (H) 09/16/2017 0232   CREATININE 1.52 (H) 02/07/2015 1151   CALCIUM 8.3 (L) 09/16/2017 0232   GFRNONAA 43 (L) 09/16/2017 0232   GFRNONAA 50 (L) 02/07/2015 1151   GFRAA 50 (L) 09/16/2017 0232   GFRAA 58 (L) 02/07/2015 1151   COAG Lab Results  Component Value Date   INR 1.18 09/09/2017   INR 1.20 08/08/2017   INR 1.02 04/30/2014     Disposition:  Discharge to :Home Discharge Instructions    Call MD for:  redness, tenderness, or signs of  infection (pain, swelling, bleeding, redness, odor or green/yellow discharge around incision site)   Complete by:  As directed    Call MD for:  severe or increased pain, loss or decreased feeling  in affected limb(s)   Complete by:  As directed    Call MD for:  temperature >100.5   Complete by:  As directed    Discharge instructions   Complete by:  As directed    Heel weight bearing in shoe for protection, elevate you foot when at rest.  Shower as needed dry incisions well.  Continue current dressing changes with hydro gel and guaze daily.   Driving Restrictions   Complete by:  As directed    No driving for 3-4 weeks   Lifting restrictions   Complete by:  As directed    No lifting for 3-4 weeks   Resume previous diet   Complete by:  As directed      Allergies as of 09/16/2017      Reactions   Sulfa Antibiotics Rash  Medication List    TAKE these medications   ACCU-CHEK ADVANTAGE DIABETES kit Use as instructed   ACCU-CHEK FASTCLIX LANCETS Misc Check blood sugar TID & QHS   allantoin gel Apply as needed topically for wound care.   amiodarone 400 MG tablet Commonly known as:  PACERONE Take 0.5 tablets (200 mg total) by mouth daily.   amLODipine 10 MG tablet Commonly known as:  NORVASC Take 10 mg by mouth daily.   apixaban 5 MG Tabs tablet Commonly known as:  ELIQUIS Take 1 tablet (5 mg total) by mouth 2 (two) times daily.   aspirin 81 MG chewable tablet Chew 81 mg by mouth daily.   atorvastatin 20 MG tablet Commonly known as:  LIPITOR Take 20 tablets by mouth daily.   blood glucose meter kit and supplies Kit Check blood sugar TID & QHS   chlorthalidone 50 MG tablet Commonly known as:  HYGROTON Take 50 mg by mouth daily.   glucose blood test strip Use as instructed   glucose blood test strip Commonly known as:  ACCU-CHEK AVIVA PLUS Use as instructed   glucose blood test strip Commonly known as:  CHOICE DM FORA G20 TEST STRIPS Check blood sugar TID  & QHS   hydrALAZINE 100 MG tablet Commonly known as:  APRESOLINE Take 100 mg by mouth 3 (three) times daily.   lisinopril 10 MG tablet Commonly known as:  PRINIVIL,ZESTRIL Take 10 mg by mouth daily.   metoprolol tartrate 100 MG tablet Commonly known as:  LOPRESSOR Take 100 mg by mouth 2 (two) times daily.   multivitamin with minerals Tabs tablet Take 1 tablet by mouth daily.   NOVOLIN 70/30 [place] Inject 46-56 Units into the skin See admin instructions. Inject 56 units SQ in the morning and inject 46 units SQ in the evening   oxyCODONE-acetaminophen 5-325 MG tablet Commonly known as:  PERCOCET/ROXICET Take 1 tablet by mouth every 6 (six) hours as needed for moderate pain.   prazosin 2 MG capsule Commonly known as:  MINIPRESS Take 2 mg by mouth every evening.      Verbal and written Discharge instructions given to the patient. Wound care per Discharge AVS Follow-up Information    Angelia Mould, MD Follow up in 2 week(s).   Specialties:  Vascular Surgery, Cardiology Why:  Office will call you to arrange your appt (sent) Contact information: [address] [place] [address] [phone]           Signed: Laurence Slate [facility] 09/18/2017, 12:02 PM - For VQI Registry use --- Instructions: Press F2 to tab through selections.  Delete question if not applicable.   Post-op:  Wound infection: No  Graft infection: No  Transfusion: Yes  If yes, 2 units given New Arrhythmia: No Ipsilateral amputation: [x ] no, _0  Minor, _1  BKA, _2  AKA Discharge patency: _3  Primary, [x ] Primary assisted, _4  Secondary, _5  Occluded Patency judged by: [x ] Dopper only, _6  Palpable graft pulse, _7  Palpable distal pulse, _8  ABI inc. > 0.15, _9  Duplex Discharge ABI: R 0.86, L 0.78  D/C Ambulatory Status: Ambulatory with Assistance  Complications: MI: [x ] No, _10  Troponin only, _11  EKG or Clinical CHF: No Resp failure: [ x] none, _12  Pneumonia, _13  Ventilator Chg in  renal function: [x ] none, _14  Inc. Cr > 0.5, _15  Temp. Dialysis, _16  Permanent dialysis Stroke: [x ] None, _17  Minor, _18   Major Return to OR: Yes  Reason for return to OR: _0  Bleeding, _1  Infection, [x Thrombosis, _2  Revision  Discharge medications: Statin use:  Yes ASA use:  Yes Plavix use:  No  for medical reason   Beta blocker use: Yes Coumadin use: No  for medical reason on Eliquis

## 2017-09-18 NOTE — Progress Notes (Signed)
Patient name: Alexander [name] MRN: [id number] DOB: [birth year] Sex: male  REASON FOR VISIT:   Follow-up  HPI:   Alexander [name] is a pleasant 56 y.o. male who had presented with a nonhealing wound of the right foot.  He underwent a right superficial femoral artery to below-knee popliteal artery bypass with a vein graft and ray amputation of the right fourth and fifth toes.  On postop day 2 his graft occluded and Dr. Donzetta Matters took him back and reversed the vein and tunneled it superficially.  He did well after that.  He returns for his first postoperative visit.  Patient is doing well and his only complaint is moderate right lower extremity swelling.  He has been doing the dressing changes and ambulating with a Darco shoe.  Current Outpatient Medications  Medication Sig Dispense Refill  . ACCU-CHEK FASTCLIX LANCETS MISC Check blood sugar TID & QHS 102 each 2  . amiodarone (PACERONE) 400 MG tablet Take 0.5 tablets (200 mg total) by mouth daily. 60 tablet 0  . amLODipine (NORVASC) 10 MG tablet Take 10 mg by mouth daily.   1  . apixaban (ELIQUIS) 5 MG TABS tablet Take 1 tablet (5 mg total) by mouth 2 (two) times daily. 180 tablet 3  . aspirin 81 MG chewable tablet Chew 81 mg by mouth daily.     Marland Kitchen atorvastatin (LIPITOR) 20 MG tablet Take 20 tablets by mouth daily.  2  . blood glucose meter kit and supplies KIT Check blood sugar TID & QHS 1 each 0  . Blood Glucose Monitoring Suppl (ACCU-CHEK ADVANTAGE DIABETES) kit Use as instructed 1 each 0  . chlorthalidone (HYGROTON) 50 MG tablet Take 50 mg by mouth daily.    Marland Kitchen glucose blood (ACCU-CHEK AVIVA PLUS) test strip Use as instructed 100 each 12  . glucose blood (CHOICE DM FORA G20 TEST STRIPS) test strip Check blood sugar TID & QHS 100 each 12  . glucose blood test strip Use as instructed 100 each 12  . hydrALAZINE (APRESOLINE) 100 MG tablet Take 100 mg by mouth 3 (three) times daily.    . Insulin NPH Isophane & Regular (NOVOLIN 70/30 [place]) Inject 46-56  Units into the skin See admin instructions. Inject 56 units SQ in the morning and inject 46 units SQ in the evening    . lisinopril (PRINIVIL,ZESTRIL) 10 MG tablet Take 10 mg by mouth daily.   0  . metoprolol (LOPRESSOR) 100 MG tablet Take 100 mg by mouth 2 (two) times daily.    . Multiple Vitamin (MULTIVITAMIN WITH MINERALS) TABS tablet Take 1 tablet by mouth daily.    Marland Kitchen oxyCODONE-acetaminophen (PERCOCET/ROXICET) 5-325 MG tablet Take 1 tablet by mouth every 6 (six) hours as needed for moderate pain. 30 tablet 0  . prazosin (MINIPRESS) 2 MG capsule Take 2 mg by mouth every evening.   0  . Wound Dressings (ALLANTOIN) gel Apply as needed topically for wound care. 15 g 0   No current facility-administered medications for this visit.     REVIEW OF SYSTEMS:  '[X]'  denotes positive finding, '[ ]'  denotes negative finding Cardiac  Comments:  Chest pain or chest pressure:    Shortness of breath upon exertion:    Short of breath when lying flat:    Irregular heart rhythm:    Constitutional    Fever or chills:     PHYSICAL EXAM:   Vitals:   09/18/17 0904  BP: 136/68  Pulse: 68  Resp: 16  Temp:  98.5 F (36.9 C)  TempSrc: Oral  SpO2: 97%  Weight: 260 lb (117.9 kg)  Height: 6' (1.829 m)    GENERAL: The patient is a well-nourished male, in no acute distress. The vital signs are documented above. CARDIOVASCULAR: There is a regular rate and rhythm. PULMONARY: There is good air exchange bilaterally without wheezing or rales. VASCULAR: He has a brisk posterior tibial signal with the Doppler.   His incisions are healing nicely.   He has moderate  drainage on his dressing.  There is mild erythema. He has moderate right lower extremity swelling.  DATA:   No new data  MEDICAL ISSUES:   STATUS POST RIGHT FEMOROPOPLITEAL BYPASS WITH VEIN: We have discussed the importance of leg elevation and have encouraged him to focus on this for the next few weeks to get the swelling down.  I think this will  help with healing of the toe amputation site.  I also written him a prescription for Keflex given the cellulitis on the foot.  I will see him back in 2 weeks.  Hopefully at that time I can get out his staples in the thigh and also remove the staples from the toe amputation sites possibly.  [name] [facility] [place] [phone]

## 2017-09-19 ENCOUNTER — Encounter (HOSPITAL_COMMUNITY)
Admission: RE | Admit: 2017-09-19 | Discharge: 2017-09-19 | Disposition: A | Discharge status: Home / Self Care | Payer: Medicare Other | Source: Ambulatory Visit | Attending: Nephrology | Admitting: Nephrology

## 2017-09-19 VITALS — BP 111/55 | HR 70 | Temp 98.3°F | Resp 18

## 2017-09-19 DIAGNOSIS — N183 Chronic kidney disease, stage 3 unspecified: Secondary | ICD-10-CM

## 2017-09-19 DIAGNOSIS — D631 Anemia in chronic kidney disease: Secondary | ICD-10-CM | POA: Diagnosis not present

## 2017-09-19 DIAGNOSIS — E1122 Type 2 diabetes mellitus with diabetic chronic kidney disease: Secondary | ICD-10-CM

## 2017-09-19 LAB — POCT HEMOGLOBIN-HEMACUE: Hemoglobin: 10 g/dL — ABNORMAL LOW (ref 13.0–17.0)

## 2017-09-19 MED ORDER — DARBEPOETIN ALFA 60 MCG/0.3ML IJ SOSY: 60.0000 ug | PREFILLED_SYRINGE | INTRAMUSCULAR | Status: DC

## 2017-09-19 MED ORDER — DARBEPOETIN ALFA 60 MCG/0.3ML IJ SOSY
PREFILLED_SYRINGE | INTRAMUSCULAR | Status: AC
  Administered 2017-09-19: 60 ug
  Filled 2017-09-19: qty 0.3

## 2017-09-22 DIAGNOSIS — E1165 Type 2 diabetes mellitus with hyperglycemia: Secondary | ICD-10-CM | POA: Diagnosis not present

## 2017-10-02 ENCOUNTER — Ambulatory Visit (INDEPENDENT_AMBULATORY_CARE_PROVIDER_SITE_OTHER): Payer: Medicare Other | Admitting: Vascular Surgery

## 2017-10-02 ENCOUNTER — Ambulatory Visit: Payer: Self-pay | Admitting: Vascular Surgery

## 2017-10-02 ENCOUNTER — Encounter: Payer: Self-pay | Admitting: Vascular Surgery

## 2017-10-02 ENCOUNTER — Encounter (HOSPITAL_COMMUNITY)
Admission: RE | Admit: 2017-10-02 | Discharge: 2017-10-02 | Disposition: A | Discharge status: Home / Self Care | Payer: Medicare Other | Source: Ambulatory Visit | Attending: Nephrology | Admitting: Nephrology

## 2017-10-02 VITALS — BP 108/39 | HR 70 | Resp 18

## 2017-10-02 VITALS — BP 127/67 | HR 91 | Temp 98.5°F | Resp 20 | Ht 72.0 in | Wt 260.0 lb

## 2017-10-02 DIAGNOSIS — D631 Anemia in chronic kidney disease: Secondary | ICD-10-CM

## 2017-10-02 DIAGNOSIS — Z48812 Encounter for surgical aftercare following surgery on the circulatory system: Secondary | ICD-10-CM

## 2017-10-02 DIAGNOSIS — N183 Chronic kidney disease, stage 3 unspecified: Secondary | ICD-10-CM

## 2017-10-02 DIAGNOSIS — E1122 Type 2 diabetes mellitus with diabetic chronic kidney disease: Secondary | ICD-10-CM

## 2017-10-02 LAB — FERRITIN: FERRITIN: 181 ng/mL (ref 24–336)

## 2017-10-02 LAB — POCT HEMOGLOBIN-HEMACUE: HEMOGLOBIN: 10.1 g/dL — AB (ref 13.0–17.0)

## 2017-10-02 LAB — IRON AND TIBC
Iron: 64 ug/dL (ref 45–182)
SATURATION RATIOS: 29 % (ref 17.9–39.5)
TIBC: 220 ug/dL — AB (ref 250–450)
UIBC: 156 ug/dL

## 2017-10-02 MED ORDER — DARBEPOETIN ALFA 60 MCG/0.3ML IJ SOSY
60.0000 ug | PREFILLED_SYRINGE | INTRAMUSCULAR | Status: DC
  Administered 2017-10-02: 60 ug via SUBCUTANEOUS

## 2017-10-02 MED ORDER — DARBEPOETIN ALFA 60 MCG/0.3ML IJ SOSY
PREFILLED_SYRINGE | INTRAMUSCULAR | Status: AC
  Filled 2017-10-02: qty 0.3

## 2017-10-02 NOTE — Progress Notes (Signed)
Patient name: Alexander [name] MRN: [id number] DOB: 12/28/1960 Sex: male  REASON FOR VISIT:   Follow-up  HPI:   Alexander [name] is a pleasant 56 y.o. male who I last saw on 09/18/2017.  He had a nonhealing wound on the right foot and underwent a right superficial femoral artery to below-knee popliteal artery bypass with a vein graft and ray amputation of the right fourth and fifth toes.  His graft occluded on postoperative day #2 he was taken back in the bypass tunneled in a different fashion.  When I saw him back he had significant leg swelling we discussed the importance of tobacco cessation.  I also wrote him a prescription for Keflex as he had cellulitis in the right foot.  He comes in for 2-week follow-up visit.  Since I saw him last, I think the swelling in his leg is improved and the cellulitis has improved.  He has been elevating his leg.  He is almost completed his course of antibiotics.  Current Outpatient Medications  Medication Sig Dispense Refill  . ACCU-CHEK FASTCLIX LANCETS MISC Check blood sugar TID & QHS 102 each 2  . amiodarone (PACERONE) 400 MG tablet Take 0.5 tablets (200 mg total) by mouth daily. 60 tablet 0  . amLODipine (NORVASC) 10 MG tablet Take 10 mg by mouth daily.   1  . apixaban (ELIQUIS) 5 MG TABS tablet Take 1 tablet (5 mg total) by mouth 2 (two) times daily. 180 tablet 3  . aspirin 81 MG chewable tablet Chew 81 mg by mouth daily.     Marland Kitchen atorvastatin (LIPITOR) 20 MG tablet Take 20 tablets by mouth daily.  2  . blood glucose meter kit and supplies KIT Check blood sugar TID & QHS 1 each 0  . Blood Glucose Monitoring Suppl (ACCU-CHEK ADVANTAGE DIABETES) kit Use as instructed 1 each 0  . cephALEXin (KEFLEX) 500 MG capsule Take 1 capsule (500 mg total) 3 (three) times daily for 14 days by mouth. 42 capsule 1  . chlorthalidone (HYGROTON) 50 MG tablet Take 50 mg by mouth daily.    Marland Kitchen glucose blood (ACCU-CHEK AVIVA PLUS) test strip Use as instructed 100 each 12  . glucose  blood (CHOICE DM FORA G20 TEST STRIPS) test strip Check blood sugar TID & QHS 100 each 12  . glucose blood test strip Use as instructed 100 each 12  . hydrALAZINE (APRESOLINE) 100 MG tablet Take 100 mg by mouth 3 (three) times daily.    . Insulin NPH Isophane & Regular (NOVOLIN 70/30 [place]) Inject 46-56 Units into the skin See admin instructions. Inject 56 units SQ in the morning and inject 46 units SQ in the evening    . lisinopril (PRINIVIL,ZESTRIL) 10 MG tablet Take 10 mg by mouth daily.   0  . metoprolol (LOPRESSOR) 100 MG tablet Take 100 mg by mouth 2 (two) times daily.    . Multiple Vitamin (MULTIVITAMIN WITH MINERALS) TABS tablet Take 1 tablet by mouth daily.    Marland Kitchen oxyCODONE-acetaminophen (PERCOCET/ROXICET) 5-325 MG tablet Take 1 tablet by mouth every 6 (six) hours as needed for moderate pain. 30 tablet 0  . prazosin (MINIPRESS) 2 MG capsule Take 2 mg by mouth every evening.   0  . Wound Dressings (ALLANTOIN) gel Apply as needed topically for wound care. 15 g 0   No current facility-administered medications for this visit.    Facility-Administered Medications Ordered in Other Visits  Medication Dose Route Frequency Provider Last Rate Last Dose  .  Darbepoetin Alfa (ARANESP) 60 MCG/0.3ML injection           . Darbepoetin Alfa (ARANESP) injection 60 mcg  60 mcg Subcutaneous Q14 Days Edrick Oh, MD   60 mcg at 10/02/17 0845    REVIEW OF SYSTEMS:  '[X]'  denotes positive finding, '[ ]'  denotes negative finding Cardiac  Comments:  Chest pain or chest pressure:    Shortness of breath upon exertion:    Short of breath when lying flat:    Irregular heart rhythm:    Constitutional    Fever or chills:     PHYSICAL EXAM:   Vitals:   10/02/17 1018  BP: 127/67  Pulse: 91  Resp: 20  Temp: 98.5 F (36.9 C)  TempSrc: Oral  SpO2: 95%  Weight: 260 lb (117.9 kg)  Height: 6' (1.829 m)    GENERAL: The patient is a well-nourished male, in no acute distress. The vital signs are documented  above. CARDIOVASCULAR: There is a regular rate and rhythm. PULMONARY: There is good air exchange bilaterally without wheezing or rales. He has brisk Doppler signals in the right dorsalis pedis and posterior tibial positions.  I removed the sutures from the toe amputation site.  The wound has separated some but appears to have reasonable granulation tissue under the devitalized tissue on top. His leg swelling is better.  DATA:   No new data  MEDICAL ISSUES:   STATUS POST RIGHT FEMOROPOPLITEAL BYPASS: His bypass graft is patent with brisk Doppler signals in the right foot.  His leg swelling and cellulitis are better.  I have instructed him to do dressing changes wet-to-dry to clean up the wound at the site of the amputated toes.  I will see him back in 3-4 weeks.  He knows to call sooner if he has problems.  [name] [facility] [phone]

## 2017-10-10 DIAGNOSIS — Z950 Presence of cardiac pacemaker: Secondary | ICD-10-CM | POA: Diagnosis not present

## 2017-10-10 DIAGNOSIS — N183 Chronic kidney disease, stage 3 (moderate): Secondary | ICD-10-CM | POA: Diagnosis not present

## 2017-10-10 DIAGNOSIS — Z95820 Peripheral vascular angioplasty status with implants and grafts: Secondary | ICD-10-CM | POA: Diagnosis not present

## 2017-10-10 DIAGNOSIS — E1151 Type 2 diabetes mellitus with diabetic peripheral angiopathy without gangrene: Secondary | ICD-10-CM | POA: Diagnosis not present

## 2017-10-16 ENCOUNTER — Encounter (HOSPITAL_COMMUNITY)
Admission: RE | Admit: 2017-10-16 | Discharge: 2017-10-16 | Disposition: A | Discharge status: Home / Self Care | Payer: Medicare Other | Source: Ambulatory Visit | Attending: Nephrology | Admitting: Nephrology

## 2017-10-16 VITALS — BP 127/53 | HR 68 | Temp 98.3°F | Resp 16

## 2017-10-16 DIAGNOSIS — E1122 Type 2 diabetes mellitus with diabetic chronic kidney disease: Secondary | ICD-10-CM

## 2017-10-16 DIAGNOSIS — N183 Chronic kidney disease, stage 3 unspecified: Secondary | ICD-10-CM

## 2017-10-16 DIAGNOSIS — D631 Anemia in chronic kidney disease: Secondary | ICD-10-CM | POA: Diagnosis not present

## 2017-10-16 MED ORDER — DARBEPOETIN ALFA 60 MCG/0.3ML IJ SOSY
60.0000 ug | PREFILLED_SYRINGE | INTRAMUSCULAR | Status: DC
  Administered 2017-10-16: 60 ug via SUBCUTANEOUS

## 2017-10-16 MED ORDER — DARBEPOETIN ALFA 60 MCG/0.3ML IJ SOSY
PREFILLED_SYRINGE | INTRAMUSCULAR | Status: AC
  Filled 2017-10-16: qty 0.3

## 2017-10-17 LAB — POCT HEMOGLOBIN-HEMACUE: HEMOGLOBIN: 10.6 g/dL — AB (ref 13.0–17.0)

## 2017-10-23 ENCOUNTER — Ambulatory Visit: Payer: Medicare Other | Admitting: Vascular Surgery

## 2017-10-30 ENCOUNTER — Encounter: Payer: Self-pay | Admitting: Family

## 2017-10-30 ENCOUNTER — Ambulatory Visit (INDEPENDENT_AMBULATORY_CARE_PROVIDER_SITE_OTHER): Payer: Medicare Other | Admitting: Family

## 2017-10-30 VITALS — BP 140/68 | HR 70 | Temp 97.2°F | Resp 17 | Wt 263.5 lb

## 2017-10-30 DIAGNOSIS — I779 Disorder of arteries and arterioles, unspecified: Secondary | ICD-10-CM

## 2017-10-30 DIAGNOSIS — Z95828 Presence of other vascular implants and grafts: Secondary | ICD-10-CM

## 2017-10-30 DIAGNOSIS — Z87891 Personal history of nicotine dependence: Secondary | ICD-10-CM

## 2017-10-30 DIAGNOSIS — Z4889 Encounter for other specified surgical aftercare: Secondary | ICD-10-CM

## 2017-10-30 DIAGNOSIS — E1151 Type 2 diabetes mellitus with diabetic peripheral angiopathy without gangrene: Secondary | ICD-10-CM

## 2017-10-30 MED ORDER — COLLAGENASE 250 UNIT/GM EX OINT: 1.0000 "application " | TOPICAL_OINTMENT | Freq: Every day | CUTANEOUS | 1 refills | Status: DC

## 2017-10-30 MED ORDER — CIPROFLOXACIN HCL 500 MG PO TABS: 500.0000 mg | ORAL_TABLET | Freq: Two times a day (BID) | ORAL | 0 refills | Status: DC

## 2017-10-30 NOTE — Progress Notes (Signed)
.   Postoperative Visit   History of Present Illness  Alexander [name] is a 56 y.o. year old male who had a nonhealing wound on the right foot and underwent a right superficial femoral artery to below-knee popliteal artery bypass with a vein graft and ray amputation of the right fourth and fifth toes.  His graft occluded on postoperative day #2 he was taken back in the bypass tunneled in a different fashion.  When Dr. Scot Dock saw him back he had significant leg swelling, discussed the importance of tobacco cessation. Dr. Scot Dock prescribed Keflex as he had cellulitis in the right foot.    Dr. Scot Dock last evaluated pt on 10-02-17. At that time his bypass graft was patent with brisk Doppler signals in the right foot.  His leg swelling and cellulitis were better.  I have instructed him to do dressing changes wet-to-dry to clean up the wound at the site of the amputated toes.  I will see him back in 3-4 weeks.  He knows to call sooner if he has problems. Pt returns today for wound check. He denies fever or chills. He states he could not finish the refill on the Keflex as it upset his stomach.   He quit smoking in 2015 after 30 years of smoking.  DM: last A1C result on file was 6.5 on 09-10-17.  He has anemia and CKD, last serum creatinine result on file was 1.68 on 09-16-17, eGFR was 43 for non AA. He has atrial fib, takes Eliquis, daily 81 mg ASA, and a statin.   The patient notes resolution of lower extremity symptoms.  The patient is able to complete their activities of daily living.     For VQI Use Only  PRE-ADM LIVING: Home  AMB STATUS: Ambulatory    Past Medical History:  Diagnosis Date  . Alcohol abuse   . Anemia   . Arthritis   . Atrial fibrillation ([place])   . Cardiac syncope 10/07/14   rapid A fib with 8 sec pauses on converison with syncope- temp pacing wire placed then PPM  . Cataract    BILATERAL  . Chronic kidney disease   . Coronary artery disease   . Diabetes mellitus     dx---been  awhile  . Hyperlipemia   . Hypertension   . Peripheral vascular disease ([place])   . Presence of permanent cardiac pacemaker 10/08/2014    Past Surgical History:  Procedure Laterality Date  . ABDOMINAL AORTOGRAM W/LOWER EXTREMITY N/A 09/09/2017   Procedure: ABDOMINAL AORTOGRAM W/LOWER EXTREMITY;  Surgeon: Angelia Mould, MD;  Location: [facility];  Service: Cardiovascular;  Laterality: N/A;  . AMPUTATION Left 08/19/2013   Procedure: AMPUTATION RAY;  Surgeon: Alta Corning, MD;  Location: [facility];  Service: Orthopedics;  Laterality: Left;  ray amputation left 5th  . AMPUTATION Left 10/27/2013   Procedure: AMPUTATION DIGIT-LEFT 4TH TOE, 4th and 5th metatarsal.;  Surgeon: Angelia Mould, MD;  Location: [facility];  Service: Vascular;  Laterality: Left;  . AMPUTATION Left 11/04/2013   Procedure: LEFT FOOT TRANS-METATARSAL AMPUTATION WITH WOUND CLOSURE ;  Surgeon: Alta Corning, MD;  Location: [facility];  Service: Orthopedics;  Laterality: Left;  . AMPUTATION Right 09/10/2017   Procedure: RIGHT FOURTH AND FIFTH TOE AMPUTATION;  Surgeon: Angelia Mould, MD;  Location: [facility];  Service: Vascular;  Laterality: Right;  . CARDIAC CATHETERIZATION  10/07/2014   Procedure: LEFT HEART CATH AND CORONARY ANGIOGRAPHY;  Surgeon: Laverda Page, MD;  [facility];  Service: Cardiovascular;;  . EMBOLECTOMY Right 09/12/2017   Procedure: Thrombectomy  & Redo Right Below Knee Popliteal Artery Bypass Graft  ;  Surgeon: Cain, Brandon Christopher, MD;  [facility];  Service: Vascular;  Laterality: Right;  . FEMORAL-TIBIAL BYPASS GRAFT Left 10/27/2013   Procedure: BYPASS GRAFT LEFT FEMORAL- POSTERIOR TIBIAL ARTERY;  Surgeon: [name] Dickson, MD;  [facility];  Service: Vascular;  Laterality: Left;  . FEMORAL-TIBIAL BYPASS GRAFT Right 09/10/2017   Procedure: RIGHT SUPERFICIAL  FEMORAL ARTERY-BELOW KNEE POPLITEAL ARTERY BYPASS GRAFT WITH VEIN;  Surgeon: Dickson,  [name], MD;  [facility];  Service: Vascular;  Laterality: Right;  . I&D EXTREMITY Left 10/27/2013   Procedure: IRRIGATION AND DEBRIDEMENT EXTREMITY- LEFT FOOT;  Surgeon: [name] Dickson, MD;  [facility];  Service: Vascular;  Laterality: Left;  . INTRAOPERATIVE ARTERIOGRAM Right 09/10/2017   Procedure: INTRA OPERATIVE ARTERIOGRAM;  Surgeon: Dickson, [name], MD;  [facility];  Service: Vascular;  Laterality: Right;  . IR ANGIOGRAM FOLLOW UP STUDY  09/09/2017  . LEFT HEART CATHETERIZATION WITH CORONARY ANGIOGRAM N/A 11/09/2014   Procedure: LEFT HEART CATHETERIZATION WITH CORONARY ANGIOGRAM;  Surgeon: Jagadeesh R Ganji, MD;  [facility];  Service: Cardiovascular;  Laterality: N/A;  . LOWER EXTREMITY ANGIOGRAM Right 11/08/2015   Procedure: Lower Extremity Angiogram;  Surgeon: Vance W Brabham, MD;  [facility];  Service: Cardiovascular;  Laterality: Right;  . LUMBAR LAMINECTOMY  06/13/2012   Procedure: MICRODISCECTOMY LUMBAR LAMINECTOMY;  Surgeon: James E Nitka, MD;  [facility];  Service: Orthopedics;  Laterality: N/A;  Central laminectomy L2-3, L3-4, L4-5  . PACEMAKER INSERTION  10/08/14   MDT Advisa MRI compatible dual chamber pacemaker implanted by Dr Klein for syncope with post-termination pauses  . PERCUTANEOUS CORONARY STENT INTERVENTION (PCI-S)  11/09/2014   des to lad & distal circumflex         Dr  Ganji  . PERIPHERAL VASCULAR CATHETERIZATION N/A 11/08/2015   Procedure: Abdominal Aortogram;  Surgeon: Vance W Brabham, MD;  [facility];  Service: Cardiovascular;  Laterality: N/A;  . PERIPHERAL VASCULAR CATHETERIZATION Right 11/08/2015   Procedure: Peripheral Vascular Atherectomy;  Surgeon: Vance W Brabham, MD;  [facility];  Service: Cardiovascular;  Laterality: Right;  . PERIPHERAL VASCULAR CATHETERIZATION N/A 10/10/2016   Procedure: Lower Extremity Angiography;  Surgeon: Brandon Christopher Cain,  MD;  [facility];  Service: Cardiovascular;  Laterality: N/A;  . PERIPHERAL VASCULAR CATHETERIZATION Right 10/10/2016   Procedure: Peripheral Vascular Atherectomy;  Surgeon: Brandon Christopher Cain, MD;  [facility];  Service: Cardiovascular;  Laterality: Right;  Popliteal  . PERMANENT PACEMAKER INSERTION N/A 10/08/2014   Procedure: PERMANENT PACEMAKER INSERTION;  Surgeon: Steven C Klein, MD;  [facility];  Service: Cardiovascular;  Laterality: N/A;  . TEMPORARY PACEMAKER INSERTION Bilateral 10/07/2014   Procedure: TEMPORARY PACEMAKER INSERTION;  Surgeon: Jagadeesh R Ganji, MD;  [facility];  Service: Cardiovascular;  Laterality: Bilateral;    Social History   Socioeconomic History  . Marital status: Married    Spouse name: Not on file  . Number of children: 2  . Years of education: Not on file  . Highest education level: Not on file  Social Needs  . Financial resource strain: Not on file  . Food insecurity - worry: Not on file  . Food insecurity - inability: Not on file  . Transportation needs -   medical: Not on file  . Transportation needs - non-medical: Not on file  Occupational History  . Occupation: Disabled  Tobacco Use  . Smoking status: Former Smoker    Packs/day: 1.50    Years: 30.00    Pack years: 45.00    Types: Cigars    Last attempt to quit: 10/07/2014    Years since quitting: 3.0  . Smokeless tobacco: Never Used  Substance and Sexual Activity  . Alcohol use: Yes    Alcohol/week: 21.0 oz    Types: 35 Cans of beer per week    Comment: 6 of 12 oz beers a day  . Drug use: No  . Sexual activity: Not on file  Other Topics Concern  . Not on file  Social History Narrative  . Not on file    Allergies  Allergen Reactions  . Pletal [Cilostazol] Palpitations    "Can hear heart beating loudly".  . Sulfa Antibiotics Rash    Current Outpatient Medications on File Prior to Visit  Medication Sig Dispense Refill    . ACCU-CHEK FASTCLIX LANCETS MISC Check blood sugar TID & QHS 102 each 2  . amiodarone (PACERONE) 400 MG tablet Take 0.5 tablets (200 mg total) by mouth daily. 60 tablet 0  . amLODipine (NORVASC) 10 MG tablet Take 10 mg by mouth daily.   1  . apixaban (ELIQUIS) 5 MG TABS tablet Take 1 tablet (5 mg total) by mouth 2 (two) times daily. 180 tablet 3  . aspirin 81 MG chewable tablet Chew 81 mg by mouth daily.     Marland Kitchen atorvastatin (LIPITOR) 20 MG tablet Take 20 tablets by mouth daily.  2  . blood glucose meter kit and supplies KIT Check blood sugar TID & QHS 1 each 0  . Blood Glucose Monitoring Suppl (ACCU-CHEK ADVANTAGE DIABETES) kit Use as instructed 1 each 0  . chlorthalidone (HYGROTON) 50 MG tablet Take 50 mg by mouth daily.    Marland Kitchen glucose blood (ACCU-CHEK AVIVA PLUS) test strip Use as instructed 100 each 12  . glucose blood (CHOICE DM FORA G20 TEST STRIPS) test strip Check blood sugar TID & QHS 100 each 12  . glucose blood test strip Use as instructed 100 each 12  . hydrALAZINE (APRESOLINE) 100 MG tablet Take 100 mg by mouth 3 (three) times daily.    . Insulin NPH Isophane & Regular (NOVOLIN 70/30 [place]) Inject 46-56 Units into the skin See admin instructions. Inject 56 units SQ in the morning and inject 46 units SQ in the evening    . lisinopril (PRINIVIL,ZESTRIL) 10 MG tablet Take 10 mg by mouth daily.   0  . metoprolol (LOPRESSOR) 100 MG tablet Take 100 mg by mouth 2 (two) times daily.    . Multiple Vitamin (MULTIVITAMIN WITH MINERALS) TABS tablet Take 1 tablet by mouth daily.    Marland Kitchen oxyCODONE-acetaminophen (PERCOCET/ROXICET) 5-325 MG tablet Take 1 tablet by mouth every 6 (six) hours as needed for moderate pain. 30 tablet 0  . prazosin (MINIPRESS) 2 MG capsule Take 2 mg by mouth every evening.   0  . Wound Dressings (ALLANTOIN) gel Apply as needed topically for wound care. 15 g 0   No current facility-administered medications on file prior to visit.       Physical Examination  Vitals:    10/30/17 0926 10/30/17 0928  BP: (!) 146/71 140/68  Pulse: 70   Resp: 17   Temp: (!) 97.2 F (36.2 C)   TempSrc: Oral   SpO2: 97%  Weight: 263 lb 8 oz (119.5 kg)    Body mass index is 35.74 kg/m.           Brisk Doppler signals right DP, PT, and peroneal. 1+ palpable bilateral femoral pulses. Mild erythema at right groin incision and medial thigh incisions, medial right calf incision healing well. No drainage at any of the incisions.  Right foot 4th and 5th toes amputation site with necrotic tissue, slightly foul smell, no purulence.    Medical Decision Making  JORDANI NUNN is a 56 y.o. year old male who presents s/p right superficial femoral artery to below-knee popliteal artery bypass with a vein graft and ray amputation of the right fourth and fifth toes (09-10-17, Dr. Scot Dock).  His graft occluded on postoperative day #2 he was taken back in the bypass tunneled in a different fashion (09-12-17, DrDonzetta Matters).   Dr. Scot Dock debrided right 4th, 5th toes amputation site of necrotic tissue. Santyl dressing changes daily, pt has some at home.  Cipro 500 mg po bid x 10 days, disp #20, 0 refills.  Return in 3 weeks and see Dr. Scot Dock only. I advised pt and daughter to notify us if they develop concerns re his incisions or toes amputation site.    Alexander Chambers, RN, MSN, FNP-C [facility] Office: [phone]  10/30/2017, 9:31 AM  Clinic MD: Scot Dock

## 2017-10-31 ENCOUNTER — Encounter (HOSPITAL_COMMUNITY)
Admission: RE | Admit: 2017-10-31 | Discharge: 2017-10-31 | Disposition: A | Discharge status: Home / Self Care | Payer: Medicare Other | Source: Ambulatory Visit | Attending: Nephrology | Admitting: Nephrology

## 2017-10-31 VITALS — BP 101/46 | HR 66 | Temp 98.6°F | Resp 18

## 2017-10-31 DIAGNOSIS — N183 Chronic kidney disease, stage 3 unspecified: Secondary | ICD-10-CM

## 2017-10-31 DIAGNOSIS — D631 Anemia in chronic kidney disease: Secondary | ICD-10-CM

## 2017-10-31 DIAGNOSIS — E1122 Type 2 diabetes mellitus with diabetic chronic kidney disease: Secondary | ICD-10-CM

## 2017-10-31 DIAGNOSIS — E113551 Type 2 diabetes mellitus with stable proliferative diabetic retinopathy, right eye: Secondary | ICD-10-CM | POA: Diagnosis not present

## 2017-10-31 DIAGNOSIS — H3561 Retinal hemorrhage, right eye: Secondary | ICD-10-CM | POA: Diagnosis not present

## 2017-10-31 DIAGNOSIS — E113522 Type 2 diabetes mellitus with proliferative diabetic retinopathy with traction retinal detachment involving the macula, left eye: Secondary | ICD-10-CM | POA: Diagnosis not present

## 2017-10-31 DIAGNOSIS — H35042 Retinal micro-aneurysms, unspecified, left eye: Secondary | ICD-10-CM | POA: Diagnosis not present

## 2017-10-31 LAB — IRON AND TIBC
Iron: 62 ug/dL (ref 45–182)
Saturation Ratios: 25 % (ref 17.9–39.5)
TIBC: 252 ug/dL (ref 250–450)
UIBC: 190 ug/dL

## 2017-10-31 LAB — FERRITIN: FERRITIN: 176 ng/mL (ref 24–336)

## 2017-10-31 LAB — POCT HEMOGLOBIN-HEMACUE: HEMOGLOBIN: 10.7 g/dL — AB (ref 13.0–17.0)

## 2017-10-31 MED ORDER — DARBEPOETIN ALFA 60 MCG/0.3ML IJ SOSY
PREFILLED_SYRINGE | INTRAMUSCULAR | Status: AC
  Administered 2017-10-31: 60 ug via SUBCUTANEOUS
  Filled 2017-10-31: qty 0.3

## 2017-10-31 MED ORDER — DARBEPOETIN ALFA 60 MCG/0.3ML IJ SOSY
60.0000 ug | PREFILLED_SYRINGE | INTRAMUSCULAR | Status: DC
  Administered 2017-10-31: 60 ug via SUBCUTANEOUS

## 2017-11-14 ENCOUNTER — Encounter (HOSPITAL_COMMUNITY)
Admission: RE | Admit: 2017-11-14 | Discharge: 2017-11-14 | Disposition: A | Discharge status: Home / Self Care | Payer: Medicare Other | Source: Ambulatory Visit | Attending: Nephrology | Admitting: Nephrology

## 2017-11-14 VITALS — BP 115/55 | HR 66 | Temp 98.4°F | Resp 18

## 2017-11-14 DIAGNOSIS — N183 Chronic kidney disease, stage 3 unspecified: Secondary | ICD-10-CM

## 2017-11-14 DIAGNOSIS — D631 Anemia in chronic kidney disease: Secondary | ICD-10-CM | POA: Insufficient documentation

## 2017-11-14 DIAGNOSIS — E1122 Type 2 diabetes mellitus with diabetic chronic kidney disease: Secondary | ICD-10-CM

## 2017-11-14 LAB — POCT HEMOGLOBIN-HEMACUE: HEMOGLOBIN: 10.4 g/dL — AB (ref 13.0–17.0)

## 2017-11-14 MED ORDER — DARBEPOETIN ALFA 60 MCG/0.3ML IJ SOSY
PREFILLED_SYRINGE | INTRAMUSCULAR | Status: AC
  Administered 2017-11-14: 60 ug
  Filled 2017-11-14: qty 0.3

## 2017-11-14 MED ORDER — DARBEPOETIN ALFA 60 MCG/0.3ML IJ SOSY: 60.0000 ug | PREFILLED_SYRINGE | INTRAMUSCULAR | Status: DC

## 2017-11-20 ENCOUNTER — Ambulatory Visit (INDEPENDENT_AMBULATORY_CARE_PROVIDER_SITE_OTHER): Payer: Medicare Other | Admitting: Vascular Surgery

## 2017-11-20 ENCOUNTER — Encounter: Payer: Self-pay | Admitting: Vascular Surgery

## 2017-11-20 VITALS — BP 145/74 | HR 79 | Temp 97.4°F | Resp 20 | Ht 72.0 in | Wt 263.0 lb

## 2017-11-20 DIAGNOSIS — I7025 Atherosclerosis of native arteries of other extremities with ulceration: Secondary | ICD-10-CM

## 2017-11-20 DIAGNOSIS — I739 Peripheral vascular disease, unspecified: Secondary | ICD-10-CM

## 2017-11-20 DIAGNOSIS — Z48812 Encounter for surgical aftercare following surgery on the circulatory system: Secondary | ICD-10-CM

## 2017-11-20 NOTE — Progress Notes (Signed)
Patient name: Alexander [name] MRN: [id number] DOB: September 19, 1961 Sex: male  REASON FOR VISIT:   Follow-up  HPI:   Alexander [name] is a pleasant 57 y.o. male who underwent a right superficial femoral artery to below-knee popliteal artery bypass with a vein graft and ray amputation of the right fourth and fifth toes.  His graft occluded on postoperative day #2 he was taken back to the operating room for thrombectomy.  He was last seen in our office by the nurse practitioner on 10/30/2017.  I debrided his wounds some at that time he has been doing dressing changes.  He is on Cipro.  Comes in for a wound check.  Since I saw him last, he has no specific complaints.  He has been doing the dressing changes with Santyl.  Current Outpatient Medications  Medication Sig Dispense Refill  . ACCU-CHEK FASTCLIX LANCETS MISC Check blood sugar TID & QHS 102 each 2  . amiodarone (PACERONE) 400 MG tablet Take 0.5 tablets (200 mg total) by mouth daily. 60 tablet 0  . amLODipine (NORVASC) 10 MG tablet Take 10 mg by mouth daily.   1  . apixaban (ELIQUIS) 5 MG TABS tablet Take 1 tablet (5 mg total) by mouth 2 (two) times daily. 180 tablet 3  . aspirin 81 MG chewable tablet Chew 81 mg by mouth daily.     Marland Kitchen atorvastatin (LIPITOR) 20 MG tablet Take 20 tablets by mouth daily.  2  . blood glucose meter kit and supplies KIT Check blood sugar TID & QHS 1 each 0  . Blood Glucose Monitoring Suppl (ACCU-CHEK ADVANTAGE DIABETES) kit Use as instructed 1 each 0  . chlorthalidone (HYGROTON) 50 MG tablet Take 50 mg by mouth daily.    . collagenase (SANTYL) ointment Apply 1 application topically daily. 30 g 1  . glucose blood (ACCU-CHEK AVIVA PLUS) test strip Use as instructed 100 each 12  . glucose blood (CHOICE DM FORA G20 TEST STRIPS) test strip Check blood sugar TID & QHS 100 each 12  . glucose blood test strip Use as instructed 100 each 12  . hydrALAZINE (APRESOLINE) 100 MG tablet Take 100 mg by mouth 3 (three) times daily.      . Insulin NPH Isophane & Regular (NOVOLIN 70/30 [place]) Inject 46-56 Units into the skin See admin instructions. Inject 56 units SQ in the morning and inject 46 units SQ in the evening    . lisinopril (PRINIVIL,ZESTRIL) 10 MG tablet Take 10 mg by mouth daily.   0  . metoprolol (LOPRESSOR) 100 MG tablet Take 100 mg by mouth 2 (two) times daily.    . Multiple Vitamin (MULTIVITAMIN WITH MINERALS) TABS tablet Take 1 tablet by mouth daily.    . Wound Dressings (ALLANTOIN) gel Apply as needed topically for wound care. 15 g 0   No current facility-administered medications for this visit.     REVIEW OF SYSTEMS:  _0  denotes positive finding, _1  denotes negative finding Cardiac  Comments:  Chest pain or chest pressure:    Shortness of breath upon exertion:    Short of breath when lying flat:    Irregular heart rhythm:    Constitutional    Fever or chills:     PHYSICAL EXAM:   Vitals:   11/20/17 1143  BP: (!) 145/74  Pulse: 79  Resp: 20  Temp: (!) 97.4 F (36.3 C)  TempSrc: Oral  SpO2: 97%  Weight: 263 lb (119.3 kg)  Height: 6' (1.829 m)  GENERAL: The patient is a well-nourished male, in no acute distress. The vital signs are documented above. CARDIOVASCULAR: There is a regular rate and rhythm. PULMONARY: There is good air exchange bilaterally without wheezing or rales. His incisions all look good. He has a brisk dorsalis pedis and posterior tibial signal with the Doppler. The toe amputation site which is open has reasonable granulation tissue.  I did do some debridement of devitalized tissue.  DATA:   No new data.  MEDICAL ISSUES:   STATUS POST RIGHT SUPERFICIAL FEMORAL ARTERY TO POPLITEAL ARTERY BYPASS WITH VEIN AND FOURTH AND FIFTH TOE AMPS: His bypass graft is patent.  The wound on his right foot is slowly healing.  He will continue dressing changes with Santyl.  I will see him back in 6 weeks.  We will check his graft and ABIs at that time.  Fortunately he is not a  smoker.  [name] [facility] [phone]

## 2017-11-22 NOTE — Addendum Note (Signed)
Addended by: Lianne Cure A on: 11/22/2017 12:40 PM   Modules accepted: Orders

## 2017-11-26 DIAGNOSIS — Z45018 Encounter for adjustment and management of other part of cardiac pacemaker: Secondary | ICD-10-CM | POA: Diagnosis not present

## 2017-11-26 DIAGNOSIS — I495 Sick sinus syndrome: Secondary | ICD-10-CM | POA: Diagnosis not present

## 2017-11-26 DIAGNOSIS — Z950 Presence of cardiac pacemaker: Secondary | ICD-10-CM | POA: Diagnosis not present

## 2017-11-27 DIAGNOSIS — N2581 Secondary hyperparathyroidism of renal origin: Secondary | ICD-10-CM | POA: Diagnosis not present

## 2017-11-27 DIAGNOSIS — I129 Hypertensive chronic kidney disease with stage 1 through stage 4 chronic kidney disease, or unspecified chronic kidney disease: Secondary | ICD-10-CM | POA: Diagnosis not present

## 2017-11-27 DIAGNOSIS — D631 Anemia in chronic kidney disease: Secondary | ICD-10-CM | POA: Diagnosis not present

## 2017-11-27 DIAGNOSIS — N183 Chronic kidney disease, stage 3 (moderate): Secondary | ICD-10-CM | POA: Diagnosis not present

## 2017-11-27 DIAGNOSIS — R809 Proteinuria, unspecified: Secondary | ICD-10-CM | POA: Diagnosis not present

## 2017-11-27 DIAGNOSIS — E1122 Type 2 diabetes mellitus with diabetic chronic kidney disease: Secondary | ICD-10-CM | POA: Diagnosis not present

## 2017-11-28 ENCOUNTER — Encounter (HOSPITAL_COMMUNITY)
Admission: RE | Admit: 2017-11-28 | Discharge: 2017-11-28 | Disposition: A | Discharge status: Home / Self Care | Payer: Medicare Other | Source: Ambulatory Visit | Attending: Nephrology | Admitting: Nephrology

## 2017-11-28 VITALS — BP 137/65 | HR 67 | Temp 98.2°F | Resp 18

## 2017-11-28 DIAGNOSIS — D631 Anemia in chronic kidney disease: Secondary | ICD-10-CM | POA: Diagnosis not present

## 2017-11-28 DIAGNOSIS — N183 Chronic kidney disease, stage 3 unspecified: Secondary | ICD-10-CM

## 2017-11-28 DIAGNOSIS — E1122 Type 2 diabetes mellitus with diabetic chronic kidney disease: Secondary | ICD-10-CM

## 2017-11-28 LAB — IRON AND TIBC
IRON: 67 ug/dL (ref 45–182)
SATURATION RATIOS: 27 % (ref 17.9–39.5)
TIBC: 249 ug/dL — ABNORMAL LOW (ref 250–450)
UIBC: 182 ug/dL

## 2017-11-28 LAB — FERRITIN: Ferritin: 133 ng/mL (ref 24–336)

## 2017-11-28 LAB — POCT HEMOGLOBIN-HEMACUE: Hemoglobin: 11.1 g/dL — ABNORMAL LOW (ref 13.0–17.0)

## 2017-11-28 MED ORDER — DARBEPOETIN ALFA 60 MCG/0.3ML IJ SOSY
60.0000 ug | PREFILLED_SYRINGE | INTRAMUSCULAR | Status: DC
  Administered 2017-11-28: 60 ug via SUBCUTANEOUS

## 2017-11-28 MED ORDER — DARBEPOETIN ALFA 60 MCG/0.3ML IJ SOSY
PREFILLED_SYRINGE | INTRAMUSCULAR | Status: AC
  Filled 2017-11-28: qty 0.3

## 2017-12-04 ENCOUNTER — Ambulatory Visit: Payer: Medicare Other | Admitting: Vascular Surgery

## 2017-12-06 DIAGNOSIS — I129 Hypertensive chronic kidney disease with stage 1 through stage 4 chronic kidney disease, or unspecified chronic kidney disease: Secondary | ICD-10-CM | POA: Diagnosis not present

## 2017-12-06 DIAGNOSIS — N183 Chronic kidney disease, stage 3 (moderate): Secondary | ICD-10-CM | POA: Diagnosis not present

## 2017-12-12 ENCOUNTER — Encounter (HOSPITAL_COMMUNITY)
Admission: RE | Admit: 2017-12-12 | Discharge: 2017-12-12 | Disposition: A | Discharge status: Home / Self Care | Payer: Medicare Other | Source: Ambulatory Visit | Attending: Nephrology | Admitting: Nephrology

## 2017-12-12 VITALS — BP 111/55 | HR 67 | Temp 98.4°F | Resp 18

## 2017-12-12 DIAGNOSIS — D631 Anemia in chronic kidney disease: Secondary | ICD-10-CM | POA: Diagnosis not present

## 2017-12-12 DIAGNOSIS — N183 Chronic kidney disease, stage 3 unspecified: Secondary | ICD-10-CM

## 2017-12-12 DIAGNOSIS — E1122 Type 2 diabetes mellitus with diabetic chronic kidney disease: Secondary | ICD-10-CM

## 2017-12-12 LAB — POCT HEMOGLOBIN-HEMACUE: Hemoglobin: 11.5 g/dL — ABNORMAL LOW (ref 13.0–17.0)

## 2017-12-12 MED ORDER — DARBEPOETIN ALFA 150 MCG/0.3ML IJ SOSY
PREFILLED_SYRINGE | INTRAMUSCULAR | Status: AC
  Administered 2017-12-12: 120 ug via SUBCUTANEOUS
  Filled 2017-12-12: qty 0.3

## 2017-12-12 MED ORDER — DARBEPOETIN ALFA 150 MCG/0.3ML IJ SOSY
120.0000 ug | PREFILLED_SYRINGE | INTRAMUSCULAR | Status: DC
  Administered 2017-12-12: 120 ug via SUBCUTANEOUS

## 2018-01-01 ENCOUNTER — Ambulatory Visit (HOSPITAL_COMMUNITY)
Admission: RE | Admit: 2018-01-01 | Discharge: 2018-01-01 | Disposition: A | Discharge status: Home / Self Care | Payer: Medicare Other | Source: Ambulatory Visit | Attending: Vascular Surgery | Admitting: Vascular Surgery

## 2018-01-01 ENCOUNTER — Encounter: Payer: Self-pay | Admitting: Vascular Surgery

## 2018-01-01 ENCOUNTER — Ambulatory Visit: Payer: Medicare Other | Admitting: Vascular Surgery

## 2018-01-01 ENCOUNTER — Ambulatory Visit (INDEPENDENT_AMBULATORY_CARE_PROVIDER_SITE_OTHER)
Admission: RE | Admit: 2018-01-01 | Discharge: 2018-01-01 | Disposition: A | Discharge status: Home / Self Care | Payer: Medicare Other | Source: Ambulatory Visit | Attending: Vascular Surgery | Admitting: Vascular Surgery

## 2018-01-01 VITALS — BP 136/72 | HR 79 | Temp 98.1°F | Resp 18 | Ht 72.0 in | Wt 267.0 lb

## 2018-01-01 DIAGNOSIS — Z48812 Encounter for surgical aftercare following surgery on the circulatory system: Secondary | ICD-10-CM

## 2018-01-01 DIAGNOSIS — I739 Peripheral vascular disease, unspecified: Secondary | ICD-10-CM

## 2018-01-01 DIAGNOSIS — I7025 Atherosclerosis of native arteries of other extremities with ulceration: Secondary | ICD-10-CM

## 2018-01-01 NOTE — Progress Notes (Signed)
Patient name: Alexander [name] MRN: [id number] DOB: 05/18/1961 Sex: male  REASON FOR VISIT:   Follow-up  HPI:   Alexander [name] is a pleasant 57 y.o. male who underwent a right superficial femoral artery to below-knee popliteal artery bypass with a vein graft and ray amputation of the right fourth and fifth toes.  This graft occluded on postoperative day #2 and he underwent a thrombectomy and revision of his graft .  I last saw the patient on 11/20/2017 at which time his graft was patent and the right foot was slowly healing.  He comes in for a 6-week follow-up visit.  Since I saw him last he has been doing well.  He has been elevating his legs and the swelling has improved significantly.  He has been doing dressing changes with Santyl.  He denies claudication or rest pain.  Current Outpatient Medications  Medication Sig Dispense Refill  . ACCU-CHEK FASTCLIX LANCETS MISC Check blood sugar TID & QHS 102 each 2  . apixaban (ELIQUIS) 5 MG TABS tablet Take 1 tablet (5 mg total) by mouth 2 (two) times daily. 180 tablet 3  . aspirin 81 MG chewable tablet Chew 81 mg by mouth daily.     Marland Kitchen atorvastatin (LIPITOR) 20 MG tablet Take 20 tablets by mouth daily.  2  . blood glucose meter kit and supplies KIT Check blood sugar TID & QHS 1 each 0  . Blood Glucose Monitoring Suppl (ACCU-CHEK ADVANTAGE DIABETES) kit Use as instructed 1 each 0  . collagenase (SANTYL) ointment Apply 1 application topically daily. 30 g 1  . furosemide (LASIX) 40 MG tablet   1  . glucose blood (ACCU-CHEK AVIVA PLUS) test strip Use as instructed 100 each 12  . glucose blood (CHOICE DM FORA G20 TEST STRIPS) test strip Check blood sugar TID & QHS 100 each 12  . glucose blood test strip Use as instructed 100 each 12  . hydrALAZINE (APRESOLINE) 100 MG tablet Take 100 mg by mouth 3 (three) times daily.    . Insulin NPH Isophane & Regular (NOVOLIN 70/30 [place]) Inject 46-56 Units into the skin See admin instructions. Inject 56 units SQ in the  morning and inject 46 units SQ in the evening    . lisinopril (PRINIVIL,ZESTRIL) 10 MG tablet Take 10 mg by mouth daily.   0  . metoprolol (LOPRESSOR) 100 MG tablet Take 100 mg by mouth 2 (two) times daily.    . Multiple Vitamin (MULTIVITAMIN WITH MINERALS) TABS tablet Take 1 tablet by mouth daily.    Marland Kitchen amiodarone (PACERONE) 400 MG tablet Take 0.5 tablets (200 mg total) by mouth daily. (Patient not taking: Reported on 01/01/2018) 60 tablet 0  . amLODipine (NORVASC) 10 MG tablet Take 10 mg by mouth daily.   1  . chlorthalidone (HYGROTON) 50 MG tablet Take 50 mg by mouth daily.    . Wound Dressings (ALLANTOIN) gel Apply as needed topically for wound care. (Patient not taking: Reported on 01/01/2018) 15 g 0   No current facility-administered medications for this visit.     REVIEW OF SYSTEMS:  '[X]'  denotes positive finding, '[ ]'  denotes negative finding Cardiac  Comments:  Chest pain or chest pressure:    Shortness of breath upon exertion:    Short of breath when lying flat:    Irregular heart rhythm:    Constitutional    Fever or chills:     PHYSICAL EXAM:   Vitals:   01/01/18 1004 01/01/18 1005  BP: Marland Kitchen)  153/85 136/72  Pulse: 79   Resp: 18   Temp: 98.1 F (36.7 C)   TempSrc: Oral   SpO2: 96%   Weight: 267 lb (121.1 kg)   Height: 6' (1.829 m)     GENERAL: The patient is a well-nourished male, in no acute distress. The vital signs are documented above. CARDIOVASCULAR: There is a regular rate and rhythm. PULMONARY: There is good air exchange bilaterally without wheezing or rales. I do not detect carotid bruits. His incisions are healing nicely. The toe amputation site measures 24 mm x 12 mm millimeters with excellent granulation tissue. I cannot palpate femoral or popliteal pulses because of his size but the foot is warm and well-perfused.  DATA:   GRAFT DUPLEX: I have independently interpreted his graft duplex scan today.  His right femoropopliteal bypass graft is patent without  any areas of stenosis.  BILATERAL LOWER EXTREMITY ARTERIAL DOPPLER STUDY: I have independently interpreted his lower extremity arterial Doppler study.  On the right side there is a monophasic dorsalis pedis and posterior tibial signal.  ABIs 100%.  Toe pressure on the right is 152 mmHg.  On the left side there is a monophasic dorsalis pedis and posterior tibial signal.  ABIs 98%.  MEDICAL ISSUES:   STATUS POST RIGHT SUPERFICIAL FEMORAL ARTERY TO BELOW-KNEE POPLITEAL ARTERY BYPASS WITH VEIN: His bypass graft is patent.  The toe amputation site is healing nicely.  He will continue dressing changes.  I have ordered a follow-up graft duplex and ABIs in 6 months and I will see him back at that time.  He quit smoking.  He is on aspirin and a statin.  He is also on Eliquis.  He knows to call sooner if he has problems.  [name] [facility] [phone]

## 2018-01-09 ENCOUNTER — Encounter (HOSPITAL_COMMUNITY)
Admission: RE | Admit: 2018-01-09 | Discharge: 2018-01-09 | Disposition: A | Discharge status: Home / Self Care | Payer: Medicare Other | Source: Ambulatory Visit | Attending: Nephrology | Admitting: Nephrology

## 2018-01-09 VITALS — BP 128/67 | HR 77 | Temp 98.3°F | Resp 18

## 2018-01-09 DIAGNOSIS — E1122 Type 2 diabetes mellitus with diabetic chronic kidney disease: Secondary | ICD-10-CM

## 2018-01-09 DIAGNOSIS — D631 Anemia in chronic kidney disease: Secondary | ICD-10-CM | POA: Diagnosis not present

## 2018-01-09 DIAGNOSIS — N183 Chronic kidney disease, stage 3 unspecified: Secondary | ICD-10-CM

## 2018-01-09 LAB — IRON AND TIBC
Iron: 97 ug/dL (ref 45–182)
SATURATION RATIOS: 38 % (ref 17.9–39.5)
TIBC: 256 ug/dL (ref 250–450)
UIBC: 159 ug/dL

## 2018-01-09 LAB — POCT HEMOGLOBIN-HEMACUE: HEMOGLOBIN: 11.9 g/dL — AB (ref 13.0–17.0)

## 2018-01-09 LAB — FERRITIN: Ferritin: 240 ng/mL (ref 24–336)

## 2018-01-09 MED ORDER — DARBEPOETIN ALFA 150 MCG/0.3ML IJ SOSY
120.0000 ug | PREFILLED_SYRINGE | INTRAMUSCULAR | Status: DC
Start: 2018-01-09 — End: 2018-01-10
  Administered 2018-01-09: 120 ug via SUBCUTANEOUS

## 2018-01-09 MED ORDER — DARBEPOETIN ALFA 150 MCG/0.3ML IJ SOSY
PREFILLED_SYRINGE | INTRAMUSCULAR | Status: AC
  Administered 2018-01-09: 120 ug via SUBCUTANEOUS
  Filled 2018-01-09: qty 0.3

## 2018-01-28 ENCOUNTER — Other Ambulatory Visit: Payer: Self-pay

## 2018-01-28 DIAGNOSIS — I7025 Atherosclerosis of native arteries of other extremities with ulceration: Secondary | ICD-10-CM

## 2018-01-28 DIAGNOSIS — I70269 Atherosclerosis of native arteries of extremities with gangrene, unspecified extremity: Secondary | ICD-10-CM

## 2018-01-28 DIAGNOSIS — I739 Peripheral vascular disease, unspecified: Secondary | ICD-10-CM

## 2018-02-06 ENCOUNTER — Encounter (HOSPITAL_COMMUNITY)
Admission: RE | Admit: 2018-02-06 | Discharge: 2018-02-06 | Disposition: A | Discharge status: Home / Self Care | Payer: Medicare Other | Source: Ambulatory Visit | Attending: Nephrology | Admitting: Nephrology

## 2018-02-06 VITALS — BP 118/59 | HR 75 | Temp 98.0°F | Resp 18

## 2018-02-06 DIAGNOSIS — N183 Chronic kidney disease, stage 3 unspecified: Secondary | ICD-10-CM

## 2018-02-06 DIAGNOSIS — E1122 Type 2 diabetes mellitus with diabetic chronic kidney disease: Secondary | ICD-10-CM

## 2018-02-06 DIAGNOSIS — D631 Anemia in chronic kidney disease: Secondary | ICD-10-CM | POA: Diagnosis not present

## 2018-02-06 LAB — IRON AND TIBC
Iron: 109 ug/dL (ref 45–182)
Saturation Ratios: 42 % — ABNORMAL HIGH (ref 17.9–39.5)
TIBC: 260 ug/dL (ref 250–450)
UIBC: 151 ug/dL

## 2018-02-06 LAB — POCT HEMOGLOBIN-HEMACUE: HEMOGLOBIN: 11.4 g/dL — AB (ref 13.0–17.0)

## 2018-02-06 LAB — FERRITIN: FERRITIN: 269 ng/mL (ref 24–336)

## 2018-02-06 MED ORDER — DARBEPOETIN ALFA 60 MCG/0.3ML IJ SOSY
PREFILLED_SYRINGE | INTRAMUSCULAR | Status: AC
  Administered 2018-02-06: 120 ug
  Filled 2018-02-06: qty 0.6

## 2018-02-06 MED ORDER — DARBEPOETIN ALFA 150 MCG/0.3ML IJ SOSY
120.0000 ug | PREFILLED_SYRINGE | INTRAMUSCULAR | Status: DC
Start: 2018-02-06 — End: 2018-02-07

## 2018-02-26 DIAGNOSIS — I480 Paroxysmal atrial fibrillation: Secondary | ICD-10-CM | POA: Diagnosis not present

## 2018-02-26 DIAGNOSIS — I495 Sick sinus syndrome: Secondary | ICD-10-CM | POA: Diagnosis not present

## 2018-02-26 DIAGNOSIS — I25119 Atherosclerotic heart disease of native coronary artery with unspecified angina pectoris: Secondary | ICD-10-CM | POA: Diagnosis not present

## 2018-02-26 DIAGNOSIS — Z950 Presence of cardiac pacemaker: Secondary | ICD-10-CM | POA: Diagnosis not present

## 2018-03-05 ENCOUNTER — Other Ambulatory Visit (HOSPITAL_COMMUNITY): Payer: Self-pay | Admitting: *Deleted

## 2018-03-06 ENCOUNTER — Ambulatory Visit (HOSPITAL_COMMUNITY)
Admission: RE | Admit: 2018-03-06 | Discharge: 2018-03-06 | Disposition: A | Discharge status: Home / Self Care | Payer: Medicare Other | Source: Ambulatory Visit | Attending: Nephrology | Admitting: Nephrology

## 2018-03-06 VITALS — BP 125/56 | HR 65 | Temp 98.2°F | Resp 18

## 2018-03-06 DIAGNOSIS — D631 Anemia in chronic kidney disease: Secondary | ICD-10-CM | POA: Diagnosis present

## 2018-03-06 DIAGNOSIS — N183 Chronic kidney disease, stage 3 unspecified: Secondary | ICD-10-CM

## 2018-03-06 DIAGNOSIS — E1122 Type 2 diabetes mellitus with diabetic chronic kidney disease: Secondary | ICD-10-CM

## 2018-03-06 LAB — IRON AND TIBC
Iron: 80 ug/dL (ref 45–182)
SATURATION RATIOS: 34 % (ref 17.9–39.5)
TIBC: 235 ug/dL — ABNORMAL LOW (ref 250–450)
UIBC: 155 ug/dL

## 2018-03-06 LAB — FERRITIN: Ferritin: 311 ng/mL (ref 24–336)

## 2018-03-06 LAB — POCT HEMOGLOBIN-HEMACUE: Hemoglobin: 10 g/dL — ABNORMAL LOW (ref 13.0–17.0)

## 2018-03-06 MED ORDER — DARBEPOETIN ALFA 60 MCG/0.3ML IJ SOSY
PREFILLED_SYRINGE | INTRAMUSCULAR | Status: AC
  Administered 2018-03-06: 120 ug
  Filled 2018-03-06: qty 0.6

## 2018-03-06 MED ORDER — DARBEPOETIN ALFA 150 MCG/0.3ML IJ SOSY
PREFILLED_SYRINGE | INTRAMUSCULAR | Status: AC
  Filled 2018-03-06: qty 0.3

## 2018-03-06 MED ORDER — DARBEPOETIN ALFA 150 MCG/0.3ML IJ SOSY
120.0000 ug | PREFILLED_SYRINGE | INTRAMUSCULAR | Status: DC
Start: 2018-03-06 — End: 2018-03-07

## 2018-03-18 DIAGNOSIS — D631 Anemia in chronic kidney disease: Secondary | ICD-10-CM | POA: Diagnosis not present

## 2018-03-18 DIAGNOSIS — E1151 Type 2 diabetes mellitus with diabetic peripheral angiopathy without gangrene: Secondary | ICD-10-CM | POA: Diagnosis not present

## 2018-03-18 DIAGNOSIS — Z794 Long term (current) use of insulin: Secondary | ICD-10-CM | POA: Diagnosis not present

## 2018-03-18 DIAGNOSIS — N183 Chronic kidney disease, stage 3 (moderate): Secondary | ICD-10-CM | POA: Diagnosis not present

## 2018-04-03 ENCOUNTER — Ambulatory Visit (HOSPITAL_COMMUNITY)
Admission: RE | Admit: 2018-04-03 | Discharge: 2018-04-03 | Disposition: A | Discharge status: Home / Self Care | Payer: Medicare Other | Source: Ambulatory Visit | Attending: Nephrology | Admitting: Nephrology

## 2018-04-03 VITALS — BP 115/59 | HR 63 | Temp 98.7°F | Resp 18

## 2018-04-03 DIAGNOSIS — N183 Chronic kidney disease, stage 3 unspecified: Secondary | ICD-10-CM

## 2018-04-03 DIAGNOSIS — D631 Anemia in chronic kidney disease: Secondary | ICD-10-CM | POA: Diagnosis not present

## 2018-04-03 DIAGNOSIS — E1122 Type 2 diabetes mellitus with diabetic chronic kidney disease: Secondary | ICD-10-CM

## 2018-04-03 LAB — FERRITIN: Ferritin: 266 ng/mL (ref 24–336)

## 2018-04-03 LAB — POCT HEMOGLOBIN-HEMACUE: HEMOGLOBIN: 10.4 g/dL — AB (ref 13.0–17.0)

## 2018-04-03 LAB — IRON AND TIBC
Iron: 70 ug/dL (ref 45–182)
Saturation Ratios: 29 % (ref 17.9–39.5)
TIBC: 242 ug/dL — ABNORMAL LOW (ref 250–450)
UIBC: 172 ug/dL

## 2018-04-03 MED ORDER — DARBEPOETIN ALFA 150 MCG/0.3ML IJ SOSY
PREFILLED_SYRINGE | INTRAMUSCULAR | Status: AC
  Administered 2018-04-03: 120 ug via SUBCUTANEOUS
  Filled 2018-04-03: qty 0.3

## 2018-04-03 MED ORDER — DARBEPOETIN ALFA 150 MCG/0.3ML IJ SOSY
120.0000 ug | PREFILLED_SYRINGE | INTRAMUSCULAR | Status: DC
  Administered 2018-04-03: 120 ug via SUBCUTANEOUS

## 2018-04-13 DIAGNOSIS — E1151 Type 2 diabetes mellitus with diabetic peripheral angiopathy without gangrene: Secondary | ICD-10-CM | POA: Diagnosis not present

## 2018-05-01 ENCOUNTER — Ambulatory Visit (HOSPITAL_COMMUNITY)
Admission: RE | Admit: 2018-05-01 | Discharge: 2018-05-01 | Disposition: A | Discharge status: Home / Self Care | Payer: Medicare Other | Source: Ambulatory Visit | Attending: Nephrology | Admitting: Nephrology

## 2018-05-01 VITALS — BP 179/55 | HR 79 | Temp 98.5°F | Resp 18

## 2018-05-01 DIAGNOSIS — N183 Chronic kidney disease, stage 3 unspecified: Secondary | ICD-10-CM

## 2018-05-01 DIAGNOSIS — D631 Anemia in chronic kidney disease: Secondary | ICD-10-CM | POA: Insufficient documentation

## 2018-05-01 DIAGNOSIS — E1122 Type 2 diabetes mellitus with diabetic chronic kidney disease: Secondary | ICD-10-CM

## 2018-05-01 LAB — IRON AND TIBC
Iron: 87 ug/dL (ref 45–182)
Saturation Ratios: 35 % (ref 17.9–39.5)
TIBC: 248 ug/dL — ABNORMAL LOW (ref 250–450)
UIBC: 161 ug/dL

## 2018-05-01 LAB — FERRITIN: FERRITIN: 258 ng/mL (ref 24–336)

## 2018-05-01 LAB — POCT HEMOGLOBIN-HEMACUE: Hemoglobin: 10.3 g/dL — ABNORMAL LOW (ref 13.0–17.0)

## 2018-05-01 MED ORDER — DARBEPOETIN ALFA 150 MCG/0.3ML IJ SOSY: 120.0000 ug | PREFILLED_SYRINGE | INTRAMUSCULAR | Status: DC

## 2018-05-01 MED ORDER — DARBEPOETIN ALFA 60 MCG/0.3ML IJ SOSY
PREFILLED_SYRINGE | INTRAMUSCULAR | Status: AC
  Administered 2018-05-01: 120 ug via SUBCUTANEOUS
  Filled 2018-05-01: qty 0.6

## 2018-05-28 ENCOUNTER — Other Ambulatory Visit (HOSPITAL_COMMUNITY): Payer: Self-pay

## 2018-05-29 ENCOUNTER — Ambulatory Visit (HOSPITAL_COMMUNITY)
Admission: RE | Admit: 2018-05-29 | Discharge: 2018-05-29 | Disposition: A | Discharge status: Home / Self Care | Payer: Medicare Other | Source: Ambulatory Visit | Attending: Nephrology | Admitting: Nephrology

## 2018-05-29 VITALS — BP 152/72 | HR 75 | Temp 98.4°F | Resp 18

## 2018-05-29 DIAGNOSIS — N183 Chronic kidney disease, stage 3 unspecified: Secondary | ICD-10-CM

## 2018-05-29 DIAGNOSIS — D631 Anemia in chronic kidney disease: Secondary | ICD-10-CM | POA: Insufficient documentation

## 2018-05-29 DIAGNOSIS — E1122 Type 2 diabetes mellitus with diabetic chronic kidney disease: Secondary | ICD-10-CM

## 2018-05-29 LAB — IRON AND TIBC
IRON: 116 ug/dL (ref 45–182)
Saturation Ratios: 47 % — ABNORMAL HIGH (ref 17.9–39.5)
TIBC: 248 ug/dL — ABNORMAL LOW (ref 250–450)
UIBC: 132 ug/dL

## 2018-05-29 LAB — FERRITIN: Ferritin: 230 ng/mL (ref 24–336)

## 2018-05-29 LAB — POCT HEMOGLOBIN-HEMACUE: HEMOGLOBIN: 10.8 g/dL — AB (ref 13.0–17.0)

## 2018-05-29 MED ORDER — DARBEPOETIN ALFA 60 MCG/0.3ML IJ SOSY
PREFILLED_SYRINGE | INTRAMUSCULAR | Status: AC
  Administered 2018-05-29: 120 ug
  Filled 2018-05-29: qty 0.6

## 2018-05-29 MED ORDER — DARBEPOETIN ALFA 150 MCG/0.3ML IJ SOSY: 120.0000 ug | PREFILLED_SYRINGE | INTRAMUSCULAR | Status: DC

## 2018-06-26 ENCOUNTER — Ambulatory Visit (HOSPITAL_COMMUNITY)
Admission: RE | Admit: 2018-06-26 | Discharge: 2018-06-26 | Disposition: A | Discharge status: Home / Self Care | Payer: Medicare Other | Source: Ambulatory Visit | Attending: Nephrology | Admitting: Nephrology

## 2018-06-26 VITALS — BP 154/57 | HR 83 | Temp 98.1°F | Ht 72.0 in | Wt 260.0 lb

## 2018-06-26 DIAGNOSIS — D631 Anemia in chronic kidney disease: Secondary | ICD-10-CM | POA: Insufficient documentation

## 2018-06-26 DIAGNOSIS — N183 Chronic kidney disease, stage 3 unspecified: Secondary | ICD-10-CM

## 2018-06-26 DIAGNOSIS — E1122 Type 2 diabetes mellitus with diabetic chronic kidney disease: Secondary | ICD-10-CM

## 2018-06-26 LAB — IRON AND TIBC
IRON: 62 ug/dL (ref 45–182)
SATURATION RATIOS: 25 % (ref 17.9–39.5)
TIBC: 252 ug/dL (ref 250–450)
UIBC: 190 ug/dL

## 2018-06-26 LAB — POCT HEMOGLOBIN-HEMACUE: Hemoglobin: 10.8 g/dL — ABNORMAL LOW (ref 13.0–17.0)

## 2018-06-26 LAB — FERRITIN: Ferritin: 211 ng/mL (ref 24–336)

## 2018-06-26 MED ORDER — DARBEPOETIN ALFA 150 MCG/0.3ML IJ SOSY: 120.0000 ug | PREFILLED_SYRINGE | INTRAMUSCULAR | Status: DC

## 2018-06-26 MED ORDER — DARBEPOETIN ALFA 60 MCG/0.3ML IJ SOSY
PREFILLED_SYRINGE | INTRAMUSCULAR | Status: AC
  Administered 2018-06-26: 120 ug via SUBCUTANEOUS
  Filled 2018-06-26: qty 0.6

## 2018-07-02 DIAGNOSIS — I129 Hypertensive chronic kidney disease with stage 1 through stage 4 chronic kidney disease, or unspecified chronic kidney disease: Secondary | ICD-10-CM | POA: Diagnosis not present

## 2018-07-02 DIAGNOSIS — E1122 Type 2 diabetes mellitus with diabetic chronic kidney disease: Secondary | ICD-10-CM | POA: Diagnosis not present

## 2018-07-02 DIAGNOSIS — N2581 Secondary hyperparathyroidism of renal origin: Secondary | ICD-10-CM | POA: Diagnosis not present

## 2018-07-02 DIAGNOSIS — D631 Anemia in chronic kidney disease: Secondary | ICD-10-CM | POA: Diagnosis not present

## 2018-07-02 DIAGNOSIS — N183 Chronic kidney disease, stage 3 (moderate): Secondary | ICD-10-CM | POA: Diagnosis not present

## 2018-07-16 ENCOUNTER — Ambulatory Visit (HOSPITAL_COMMUNITY)
Admission: RE | Admit: 2018-07-16 | Discharge: 2018-07-16 | Disposition: A | Discharge status: Home / Self Care | Payer: Medicare Other | Source: Ambulatory Visit | Attending: Vascular Surgery | Admitting: Vascular Surgery

## 2018-07-16 ENCOUNTER — Ambulatory Visit (INDEPENDENT_AMBULATORY_CARE_PROVIDER_SITE_OTHER)
Admission: RE | Admit: 2018-07-16 | Discharge: 2018-07-16 | Disposition: A | Discharge status: Home / Self Care | Payer: Medicare Other | Source: Ambulatory Visit | Attending: Vascular Surgery | Admitting: Vascular Surgery

## 2018-07-16 ENCOUNTER — Ambulatory Visit: Payer: Medicare Other | Admitting: Vascular Surgery

## 2018-07-16 ENCOUNTER — Encounter: Payer: Self-pay | Admitting: Vascular Surgery

## 2018-07-16 ENCOUNTER — Other Ambulatory Visit: Payer: Self-pay

## 2018-07-16 VITALS — BP 158/80 | HR 79 | Temp 98.0°F | Resp 20 | Ht 72.0 in | Wt 278.0 lb

## 2018-07-16 DIAGNOSIS — I739 Peripheral vascular disease, unspecified: Secondary | ICD-10-CM | POA: Diagnosis not present

## 2018-07-16 DIAGNOSIS — I10 Essential (primary) hypertension: Secondary | ICD-10-CM | POA: Diagnosis not present

## 2018-07-16 DIAGNOSIS — R0989 Other specified symptoms and signs involving the circulatory and respiratory systems: Secondary | ICD-10-CM | POA: Diagnosis not present

## 2018-07-16 DIAGNOSIS — Z89421 Acquired absence of other right toe(s): Secondary | ICD-10-CM | POA: Insufficient documentation

## 2018-07-16 DIAGNOSIS — Z89422 Acquired absence of other left toe(s): Secondary | ICD-10-CM | POA: Insufficient documentation

## 2018-07-16 DIAGNOSIS — I7025 Atherosclerosis of native arteries of other extremities with ulceration: Secondary | ICD-10-CM

## 2018-07-16 DIAGNOSIS — R9389 Abnormal findings on diagnostic imaging of other specified body structures: Secondary | ICD-10-CM | POA: Diagnosis not present

## 2018-07-16 DIAGNOSIS — I70269 Atherosclerosis of native arteries of extremities with gangrene, unspecified extremity: Secondary | ICD-10-CM | POA: Diagnosis not present

## 2018-07-16 DIAGNOSIS — Z87891 Personal history of nicotine dependence: Secondary | ICD-10-CM | POA: Diagnosis not present

## 2018-07-16 DIAGNOSIS — E785 Hyperlipidemia, unspecified: Secondary | ICD-10-CM | POA: Insufficient documentation

## 2018-07-16 NOTE — Progress Notes (Signed)
Patient name: Alexander [name] MRN: [id number] DOB: Mar 02, 1961 Sex: male  REASON FOR VISIT:   Follow-up of right femoropopliteal bypass  HPI:   Alexander [name] is a pleasant 57 y.o. male who underwent a right superficial femoral artery to below-knee popliteal artery bypass with a vein graft and amputation of the right fourth and fifth toes on 09/10/2017.  The graft occluded and on 09/12/2017 Dr. Donzetta Matters re-tunnel the graft medially and the graft has remained patent.  Comes in for routine follow-up visit.  I last saw him on 01/01/2018 and at that time his graft was patent.  ABI on the right was 100% an ABI of the left was 98%.  He comes in for 59-monthfollow-up visit.  Of note he is on aspirin, a statin, and Eliquis.  The open wound at the amputation site on the lateral right foot has almost completely healed.  He denies any fever or drainage from the wound.  Swelling has improved in both legs.  He denies any history of claudication or rest pain.  Past Medical History:  Diagnosis Date  . Alcohol abuse   . Anemia   . Arthritis   . Atrial fibrillation ([place]   . Cardiac syncope 10/07/14   rapid A fib with 8 sec pauses on converison with syncope- temp pacing wire placed then PPM  . Cataract    BILATERAL  . Chronic kidney disease   . Coronary artery disease   . Diabetes mellitus    dx---been  awhile  . Hyperlipemia   . Hypertension   . Peripheral vascular disease ([place]   . Presence of permanent cardiac pacemaker 10/08/2014    Family History  Problem Relation Age of Onset  . Diabetes type II Mother   . Hypertension Mother   . Diabetes Mother   . Liver cancer Father   . Diabetes type II Sister   . Breast cancer Sister   . Diabetes Sister   . Hypertension Sister   . Diabetes type II Brother   . Kidney failure Brother   . Diabetes Brother   . Hypertension Brother   . Diabetes type II Sister     SOCIAL HISTORY: Social History   Tobacco Use  . Smoking status: Former Smoker   Packs/day: 1.50    Years: 30.00    Pack years: 45.00    Types: Cigars    Last attempt to quit: 10/07/2014    Years since quitting: 3.7  . Smokeless tobacco: Never Used  Substance Use Topics  . Alcohol use: Yes    Alcohol/week: 35.0 standard drinks    Types: 35 Cans of beer per week    Comment: 6 of 12 oz beers a day    Allergies  Allergen Reactions  . Pletal [Cilostazol] Palpitations    "Can hear heart beating loudly".  . Sulfa Antibiotics Rash    Current Outpatient Medications  Medication Sig Dispense Refill  . ACCU-CHEK FASTCLIX LANCETS MISC Check blood sugar TID & QHS 102 each 2  . amLODipine (NORVASC) 10 MG tablet Take 10 mg by mouth daily.   1  . apixaban (ELIQUIS) 5 MG TABS tablet Take 1 tablet (5 mg total) by mouth 2 (two) times daily. 180 tablet 3  . aspirin 81 MG chewable tablet Chew 81 mg by mouth daily.     .Marland Kitchenatorvastatin (LIPITOR) 20 MG tablet Take 20 tablets by mouth daily.  2  . blood glucose meter kit and supplies KIT Check blood sugar TID &  QHS 1 each 0  . Blood Glucose Monitoring Suppl (ACCU-CHEK ADVANTAGE DIABETES) kit Use as instructed 1 each 0  . chlorthalidone (HYGROTON) 50 MG tablet Take 50 mg by mouth daily.    . collagenase (SANTYL) ointment Apply 1 application topically daily. 30 g 1  . furosemide (LASIX) 40 MG tablet   1  . glucose blood (ACCU-CHEK AVIVA PLUS) test strip Use as instructed 100 each 12  . glucose blood (CHOICE DM FORA G20 TEST STRIPS) test strip Check blood sugar TID & QHS 100 each 12  . glucose blood test strip Use as instructed 100 each 12  . hydrALAZINE (APRESOLINE) 100 MG tablet Take 100 mg by mouth 3 (three) times daily.    . Insulin NPH Isophane & Regular (NOVOLIN 70/30 [place]) Inject 46-56 Units into the skin See admin instructions. Inject 56 units SQ in the morning and inject 46 units SQ in the evening    . lisinopril (PRINIVIL,ZESTRIL) 10 MG tablet Take 10 mg by mouth daily.   0  . metoprolol (LOPRESSOR) 100 MG tablet Take 100 mg by  mouth 2 (two) times daily.    . Multiple Vitamin (MULTIVITAMIN WITH MINERALS) TABS tablet Take 1 tablet by mouth daily.     No current facility-administered medications for this visit.     REVIEW OF SYSTEMS:  _0  denotes positive finding, _1  denotes negative finding Cardiac  Comments:  Chest pain or chest pressure:    Shortness of breath upon exertion: x   Short of breath when lying flat:    Irregular heart rhythm:        Vascular    Pain in calf, thigh, or hip brought on by ambulation:    Pain in feet at night that wakes you up from your sleep:     Blood clot in your veins:    Leg swelling:         Pulmonary    Oxygen at home:    Productive cough:     Wheezing:         Neurologic    Sudden weakness in arms or legs:     Sudden numbness in arms or legs:     Sudden onset of difficulty speaking or slurred speech:    Temporary loss of vision in one eye:     Problems with dizziness:         Gastrointestinal    Blood in stool:     Vomited blood:         Genitourinary    Burning when urinating:     Blood in urine:        Psychiatric    Major depression:         Hematologic    Bleeding problems:    Problems with blood clotting too easily:        Skin    Rashes or ulcers:        Constitutional    Fever or chills:     PHYSICAL EXAM:   Vitals:   07/16/18 1128  BP: (!) 158/80  Pulse: 79  Resp: 20  Temp: 98 F (36.7 C)  TempSrc: Oral  SpO2: 96%  Weight: 278 lb (126.1 kg)  Height: 6' (1.829 m)    GENERAL: The patient is a well-nourished male, in no acute distress. The vital signs are documented above. CARDIAC: There is a regular rate and rhythm.  VASCULAR: I do not detect carotid bruits. He has palpable femoral pulses. Both feet are warm  and well-perfused. PULMONARY: There is good air exchange bilaterally without wheezing or rales. ABDOMEN: Soft and non-tender with normal pitched bowel sounds.  MUSCULOSKELETAL: There are no major deformities or  cyanosis. NEUROLOGIC: No focal weakness or paresthesias are detected. SKIN: The small open area at the amputation site is only about a millimeter in size.  It was previously 24 mm x 12 mm and had excellent granulation tissue. PSYCHIATRIC: The patient has a normal affect.  DATA:    ARTERIAL DUPLEX: I have independently interpreted his arterial duplex scan today.  On the right side there is a biphasic dorsalis pedis and posterior tibial signal.  ABI is 100%.  Toe pressure on the right is 103 mmHg.  On the left side he has a triphasic posterior tibial signal with a biphasic dorsalis pedis signal.  ABI is 100%.  GRAFT DUPLEX: I have independently interpreted his graft duplex scan.  His graft is widely patent with triphasic signals throughout and no areas of stenosis identified.  MEDICAL ISSUES:   STATUS POST RIGHT FEMOROPOPLITEAL BYPASS WITH VEIN: His bypass graft is patent.  The toe amputation sites have continued to heal and now the wound is almost completely resolved.  I ordered a follow-up duplex scan in 6 months with ABIs at that time.  He knows to call sooner if he has problems.  He is not a smoker.  He is on aspirin and a statin.  He is also on Eliquis.  He has a history of a pacemaker.  [name] [facility] [phone]

## 2018-07-24 ENCOUNTER — Ambulatory Visit (HOSPITAL_COMMUNITY)
Admission: RE | Admit: 2018-07-24 | Discharge: 2018-07-24 | Disposition: A | Discharge status: Home / Self Care | Payer: Medicare Other | Source: Ambulatory Visit | Attending: Nephrology | Admitting: Nephrology

## 2018-07-24 VITALS — BP 143/57 | HR 67 | Temp 98.4°F | Resp 20

## 2018-07-24 DIAGNOSIS — E1122 Type 2 diabetes mellitus with diabetic chronic kidney disease: Secondary | ICD-10-CM | POA: Insufficient documentation

## 2018-07-24 DIAGNOSIS — N183 Chronic kidney disease, stage 3 unspecified: Secondary | ICD-10-CM

## 2018-07-24 DIAGNOSIS — D631 Anemia in chronic kidney disease: Secondary | ICD-10-CM

## 2018-07-24 LAB — IRON AND TIBC
IRON: 75 ug/dL (ref 45–182)
SATURATION RATIOS: 29 % (ref 17.9–39.5)
TIBC: 260 ug/dL (ref 250–450)
UIBC: 185 ug/dL

## 2018-07-24 LAB — POCT HEMOGLOBIN-HEMACUE: HEMOGLOBIN: 10.6 g/dL — AB (ref 13.0–17.0)

## 2018-07-24 LAB — FERRITIN: FERRITIN: 247 ng/mL (ref 24–336)

## 2018-07-24 MED ORDER — DARBEPOETIN ALFA 150 MCG/0.3ML IJ SOSY
120.0000 ug | PREFILLED_SYRINGE | INTRAMUSCULAR | Status: DC
  Administered 2018-07-24: 120 ug via SUBCUTANEOUS

## 2018-07-24 MED ORDER — DARBEPOETIN ALFA 150 MCG/0.3ML IJ SOSY
PREFILLED_SYRINGE | INTRAMUSCULAR | Status: AC
  Administered 2018-07-24: 120 ug via SUBCUTANEOUS
  Filled 2018-07-24: qty 0.3

## 2018-07-29 DIAGNOSIS — I495 Sick sinus syndrome: Secondary | ICD-10-CM | POA: Diagnosis not present

## 2018-07-29 DIAGNOSIS — Z950 Presence of cardiac pacemaker: Secondary | ICD-10-CM | POA: Diagnosis not present

## 2018-07-29 DIAGNOSIS — I480 Paroxysmal atrial fibrillation: Secondary | ICD-10-CM | POA: Diagnosis not present

## 2018-07-29 DIAGNOSIS — I25119 Atherosclerotic heart disease of native coronary artery with unspecified angina pectoris: Secondary | ICD-10-CM | POA: Diagnosis not present

## 2018-08-06 DIAGNOSIS — E113551 Type 2 diabetes mellitus with stable proliferative diabetic retinopathy, right eye: Secondary | ICD-10-CM | POA: Diagnosis not present

## 2018-08-06 DIAGNOSIS — E113552 Type 2 diabetes mellitus with stable proliferative diabetic retinopathy, left eye: Secondary | ICD-10-CM | POA: Diagnosis not present

## 2018-08-06 DIAGNOSIS — H35042 Retinal micro-aneurysms, unspecified, left eye: Secondary | ICD-10-CM | POA: Diagnosis not present

## 2018-08-06 DIAGNOSIS — H3561 Retinal hemorrhage, right eye: Secondary | ICD-10-CM | POA: Diagnosis not present

## 2018-08-20 ENCOUNTER — Other Ambulatory Visit (HOSPITAL_COMMUNITY): Payer: Self-pay | Admitting: *Deleted

## 2018-08-21 ENCOUNTER — Ambulatory Visit (HOSPITAL_COMMUNITY)
Admission: RE | Admit: 2018-08-21 | Discharge: 2018-08-21 | Disposition: A | Discharge status: Home / Self Care | Payer: Medicare Other | Source: Ambulatory Visit | Attending: Nephrology | Admitting: Nephrology

## 2018-08-21 VITALS — BP 152/67 | HR 67 | Temp 98.5°F | Resp 20

## 2018-08-21 DIAGNOSIS — N183 Chronic kidney disease, stage 3 unspecified: Secondary | ICD-10-CM

## 2018-08-21 DIAGNOSIS — D631 Anemia in chronic kidney disease: Secondary | ICD-10-CM | POA: Insufficient documentation

## 2018-08-21 DIAGNOSIS — E1122 Type 2 diabetes mellitus with diabetic chronic kidney disease: Secondary | ICD-10-CM

## 2018-08-21 LAB — IRON AND TIBC
Iron: 87 ug/dL (ref 45–182)
Saturation Ratios: 35 % (ref 17.9–39.5)
TIBC: 248 ug/dL — ABNORMAL LOW (ref 250–450)
UIBC: 161 ug/dL

## 2018-08-21 LAB — FERRITIN: Ferritin: 192 ng/mL (ref 24–336)

## 2018-08-21 LAB — POCT HEMOGLOBIN-HEMACUE: Hemoglobin: 10.4 g/dL — ABNORMAL LOW (ref 13.0–17.0)

## 2018-08-21 MED ORDER — DARBEPOETIN ALFA 150 MCG/0.3ML IJ SOSY
PREFILLED_SYRINGE | INTRAMUSCULAR | Status: AC
  Administered 2018-08-21: 120 ug via SUBCUTANEOUS
  Filled 2018-08-21: qty 0.3

## 2018-08-21 MED ORDER — DARBEPOETIN ALFA 150 MCG/0.3ML IJ SOSY
120.0000 ug | PREFILLED_SYRINGE | INTRAMUSCULAR | Status: DC
  Administered 2018-08-21: 120 ug via SUBCUTANEOUS

## 2018-09-17 DIAGNOSIS — I495 Sick sinus syndrome: Secondary | ICD-10-CM | POA: Diagnosis not present

## 2018-09-17 DIAGNOSIS — Z950 Presence of cardiac pacemaker: Secondary | ICD-10-CM | POA: Diagnosis not present

## 2018-09-17 DIAGNOSIS — Z45018 Encounter for adjustment and management of other part of cardiac pacemaker: Secondary | ICD-10-CM | POA: Diagnosis not present

## 2018-09-18 ENCOUNTER — Ambulatory Visit (HOSPITAL_COMMUNITY)
Admission: RE | Admit: 2018-09-18 | Discharge: 2018-09-18 | Disposition: A | Discharge status: Home / Self Care | Payer: Medicare Other | Source: Ambulatory Visit | Attending: Nephrology | Admitting: Nephrology

## 2018-09-18 VITALS — BP 139/63 | HR 65 | Temp 98.4°F | Resp 20

## 2018-09-18 DIAGNOSIS — N183 Chronic kidney disease, stage 3 unspecified: Secondary | ICD-10-CM

## 2018-09-18 DIAGNOSIS — D631 Anemia in chronic kidney disease: Secondary | ICD-10-CM | POA: Insufficient documentation

## 2018-09-18 DIAGNOSIS — E1122 Type 2 diabetes mellitus with diabetic chronic kidney disease: Secondary | ICD-10-CM | POA: Insufficient documentation

## 2018-09-18 LAB — FERRITIN: Ferritin: 222 ng/mL (ref 24–336)

## 2018-09-18 LAB — IRON AND TIBC
Iron: 77 ug/dL (ref 45–182)
Saturation Ratios: 32 % (ref 17.9–39.5)
TIBC: 242 ug/dL — AB (ref 250–450)
UIBC: 165 ug/dL

## 2018-09-18 LAB — POCT HEMOGLOBIN-HEMACUE: Hemoglobin: 10.3 g/dL — ABNORMAL LOW (ref 13.0–17.0)

## 2018-09-18 MED ORDER — DARBEPOETIN ALFA 150 MCG/0.3ML IJ SOSY
PREFILLED_SYRINGE | INTRAMUSCULAR | Status: AC
  Filled 2018-09-18: qty 0.3

## 2018-09-18 MED ORDER — DARBEPOETIN ALFA 150 MCG/0.3ML IJ SOSY
120.0000 ug | PREFILLED_SYRINGE | INTRAMUSCULAR | Status: DC
  Administered 2018-09-18: 120 ug via SUBCUTANEOUS

## 2018-10-03 DIAGNOSIS — D631 Anemia in chronic kidney disease: Secondary | ICD-10-CM | POA: Diagnosis not present

## 2018-10-03 DIAGNOSIS — Z950 Presence of cardiac pacemaker: Secondary | ICD-10-CM | POA: Diagnosis not present

## 2018-10-03 DIAGNOSIS — N183 Chronic kidney disease, stage 3 (moderate): Secondary | ICD-10-CM | POA: Diagnosis not present

## 2018-10-03 DIAGNOSIS — E1151 Type 2 diabetes mellitus with diabetic peripheral angiopathy without gangrene: Secondary | ICD-10-CM | POA: Diagnosis not present

## 2018-10-03 DIAGNOSIS — Z23 Encounter for immunization: Secondary | ICD-10-CM | POA: Diagnosis not present

## 2018-10-14 ENCOUNTER — Encounter: Payer: Self-pay | Admitting: Internal Medicine

## 2018-10-16 ENCOUNTER — Ambulatory Visit (HOSPITAL_COMMUNITY)
Admission: RE | Admit: 2018-10-16 | Discharge: 2018-10-16 | Disposition: A | Discharge status: Home / Self Care | Payer: Medicare Other | Source: Ambulatory Visit | Attending: Nephrology | Admitting: Nephrology

## 2018-10-16 VITALS — BP 122/63 | HR 84 | Temp 98.3°F | Resp 20

## 2018-10-16 DIAGNOSIS — D631 Anemia in chronic kidney disease: Secondary | ICD-10-CM | POA: Insufficient documentation

## 2018-10-16 DIAGNOSIS — N183 Chronic kidney disease, stage 3 unspecified: Secondary | ICD-10-CM

## 2018-10-16 DIAGNOSIS — E1122 Type 2 diabetes mellitus with diabetic chronic kidney disease: Secondary | ICD-10-CM

## 2018-10-16 LAB — IRON AND TIBC
Iron: 83 ug/dL (ref 45–182)
Saturation Ratios: 34 % (ref 17.9–39.5)
TIBC: 244 ug/dL — ABNORMAL LOW (ref 250–450)
UIBC: 161 ug/dL

## 2018-10-16 LAB — POCT HEMOGLOBIN-HEMACUE: HEMOGLOBIN: 10 g/dL — AB (ref 13.0–17.0)

## 2018-10-16 LAB — FERRITIN: FERRITIN: 200 ng/mL (ref 24–336)

## 2018-10-16 MED ORDER — DARBEPOETIN ALFA 150 MCG/0.3ML IJ SOSY
120.0000 ug | PREFILLED_SYRINGE | INTRAMUSCULAR | Status: DC
  Administered 2018-10-16: 120 ug via SUBCUTANEOUS

## 2018-10-16 MED ORDER — DARBEPOETIN ALFA 60 MCG/0.3ML IJ SOSY
PREFILLED_SYRINGE | INTRAMUSCULAR | Status: AC
  Filled 2018-10-16: qty 0.6

## 2018-10-17 MED FILL — Darbepoetin Alfa Soln Prefilled Syringe 60 MCG/0.3ML: INTRAMUSCULAR | Qty: 0.6 | Status: AC

## 2018-10-23 DIAGNOSIS — E1151 Type 2 diabetes mellitus with diabetic peripheral angiopathy without gangrene: Secondary | ICD-10-CM | POA: Diagnosis not present

## 2018-11-13 ENCOUNTER — Ambulatory Visit (HOSPITAL_COMMUNITY)
Admission: RE | Admit: 2018-11-13 | Discharge: 2018-11-13 | Disposition: A | Discharge status: Home / Self Care | Payer: Medicare Other | Source: Ambulatory Visit | Attending: Nephrology | Admitting: Nephrology

## 2018-11-13 VITALS — BP 126/66 | HR 78 | Resp 20

## 2018-11-13 DIAGNOSIS — D631 Anemia in chronic kidney disease: Secondary | ICD-10-CM | POA: Diagnosis not present

## 2018-11-13 DIAGNOSIS — N183 Chronic kidney disease, stage 3 unspecified: Secondary | ICD-10-CM

## 2018-11-13 DIAGNOSIS — E1122 Type 2 diabetes mellitus with diabetic chronic kidney disease: Secondary | ICD-10-CM | POA: Insufficient documentation

## 2018-11-13 LAB — POCT HEMOGLOBIN-HEMACUE: Hemoglobin: 10.4 g/dL — ABNORMAL LOW (ref 13.0–17.0)

## 2018-11-13 LAB — IRON AND TIBC
IRON: 70 ug/dL (ref 45–182)
Saturation Ratios: 28 % (ref 17.9–39.5)
TIBC: 249 ug/dL — AB (ref 250–450)
UIBC: 179 ug/dL

## 2018-11-13 LAB — FERRITIN: FERRITIN: 241 ng/mL (ref 24–336)

## 2018-11-13 MED ORDER — DARBEPOETIN ALFA 150 MCG/0.3ML IJ SOSY: 120.0000 ug | PREFILLED_SYRINGE | INTRAMUSCULAR | Status: DC

## 2018-11-13 MED ORDER — DARBEPOETIN ALFA 60 MCG/0.3ML IJ SOSY
PREFILLED_SYRINGE | INTRAMUSCULAR | Status: AC
  Administered 2018-11-13: 120 ug via SUBCUTANEOUS
  Filled 2018-11-13: qty 0.6

## 2018-11-27 DIAGNOSIS — I25119 Atherosclerotic heart disease of native coronary artery with unspecified angina pectoris: Secondary | ICD-10-CM | POA: Diagnosis not present

## 2018-11-27 DIAGNOSIS — Z45018 Encounter for adjustment and management of other part of cardiac pacemaker: Secondary | ICD-10-CM | POA: Diagnosis not present

## 2018-11-27 DIAGNOSIS — Z950 Presence of cardiac pacemaker: Secondary | ICD-10-CM | POA: Diagnosis not present

## 2018-11-27 DIAGNOSIS — I495 Sick sinus syndrome: Secondary | ICD-10-CM | POA: Diagnosis not present

## 2018-12-11 ENCOUNTER — Ambulatory Visit (HOSPITAL_COMMUNITY)
Admission: RE | Admit: 2018-12-11 | Discharge: 2018-12-11 | Disposition: A | Discharge status: Home / Self Care | Payer: Medicare Other | Source: Ambulatory Visit | Attending: Nephrology | Admitting: Nephrology

## 2018-12-11 VITALS — BP 162/73 | HR 78 | Temp 98.8°F | Resp 20

## 2018-12-11 DIAGNOSIS — N183 Chronic kidney disease, stage 3 unspecified: Secondary | ICD-10-CM

## 2018-12-11 DIAGNOSIS — D631 Anemia in chronic kidney disease: Secondary | ICD-10-CM | POA: Insufficient documentation

## 2018-12-11 DIAGNOSIS — E1122 Type 2 diabetes mellitus with diabetic chronic kidney disease: Secondary | ICD-10-CM | POA: Insufficient documentation

## 2018-12-11 LAB — IRON AND TIBC
Iron: 90 ug/dL (ref 45–182)
Saturation Ratios: 34 % (ref 17.9–39.5)
TIBC: 266 ug/dL (ref 250–450)
UIBC: 176 ug/dL

## 2018-12-11 LAB — FERRITIN: Ferritin: 247 ng/mL (ref 24–336)

## 2018-12-11 LAB — POCT HEMOGLOBIN-HEMACUE: Hemoglobin: 10.2 g/dL — ABNORMAL LOW (ref 13.0–17.0)

## 2018-12-11 MED ORDER — DARBEPOETIN ALFA 150 MCG/0.3ML IJ SOSY
120.0000 ug | PREFILLED_SYRINGE | INTRAMUSCULAR | Status: DC
  Administered 2018-12-11: 120 ug via SUBCUTANEOUS

## 2018-12-11 MED ORDER — DARBEPOETIN ALFA 150 MCG/0.3ML IJ SOSY
PREFILLED_SYRINGE | INTRAMUSCULAR | Status: AC
  Administered 2018-12-11: 120 ug via SUBCUTANEOUS
  Filled 2018-12-11: qty 0.3

## 2019-01-08 ENCOUNTER — Encounter (HOSPITAL_COMMUNITY): Payer: Medicare Other

## 2019-01-08 DIAGNOSIS — N2581 Secondary hyperparathyroidism of renal origin: Secondary | ICD-10-CM | POA: Diagnosis not present

## 2019-01-08 DIAGNOSIS — N183 Chronic kidney disease, stage 3 (moderate): Secondary | ICD-10-CM | POA: Diagnosis not present

## 2019-01-08 DIAGNOSIS — N189 Chronic kidney disease, unspecified: Secondary | ICD-10-CM | POA: Diagnosis not present

## 2019-01-08 DIAGNOSIS — E1122 Type 2 diabetes mellitus with diabetic chronic kidney disease: Secondary | ICD-10-CM | POA: Diagnosis not present

## 2019-01-08 DIAGNOSIS — I129 Hypertensive chronic kidney disease with stage 1 through stage 4 chronic kidney disease, or unspecified chronic kidney disease: Secondary | ICD-10-CM | POA: Diagnosis not present

## 2019-01-08 DIAGNOSIS — D631 Anemia in chronic kidney disease: Secondary | ICD-10-CM | POA: Diagnosis not present

## 2019-01-09 ENCOUNTER — Ambulatory Visit (HOSPITAL_COMMUNITY)
Admission: RE | Admit: 2019-01-09 | Discharge: 2019-01-09 | Disposition: A | Discharge status: Home / Self Care | Payer: Medicare Other | Source: Ambulatory Visit | Attending: Nephrology | Admitting: Nephrology

## 2019-01-09 VITALS — BP 149/68 | HR 67 | Temp 98.5°F | Resp 20

## 2019-01-09 DIAGNOSIS — E1122 Type 2 diabetes mellitus with diabetic chronic kidney disease: Secondary | ICD-10-CM | POA: Insufficient documentation

## 2019-01-09 DIAGNOSIS — N183 Chronic kidney disease, stage 3 unspecified: Secondary | ICD-10-CM

## 2019-01-09 DIAGNOSIS — D631 Anemia in chronic kidney disease: Secondary | ICD-10-CM | POA: Insufficient documentation

## 2019-01-09 LAB — FERRITIN: FERRITIN: 227 ng/mL (ref 24–336)

## 2019-01-09 LAB — IRON AND TIBC
Iron: 96 ug/dL (ref 45–182)
Saturation Ratios: 37 % (ref 17.9–39.5)
TIBC: 262 ug/dL (ref 250–450)
UIBC: 166 ug/dL

## 2019-01-09 LAB — POCT HEMOGLOBIN-HEMACUE: Hemoglobin: 10.6 g/dL — ABNORMAL LOW (ref 13.0–17.0)

## 2019-01-09 MED ORDER — DARBEPOETIN ALFA 150 MCG/0.3ML IJ SOSY
150.0000 ug | PREFILLED_SYRINGE | Freq: Once | INTRAMUSCULAR | Status: AC
  Administered 2019-01-09: 150 ug via SUBCUTANEOUS

## 2019-01-09 MED ORDER — DARBEPOETIN ALFA 150 MCG/0.3ML IJ SOSY
PREFILLED_SYRINGE | INTRAMUSCULAR | Status: AC
  Filled 2019-01-09: qty 0.3

## 2019-01-09 MED ORDER — DARBEPOETIN ALFA 150 MCG/0.3ML IJ SOSY: 120.0000 ug | PREFILLED_SYRINGE | INTRAMUSCULAR | Status: DC

## 2019-01-12 ENCOUNTER — Other Ambulatory Visit: Payer: Self-pay

## 2019-01-12 DIAGNOSIS — I739 Peripheral vascular disease, unspecified: Secondary | ICD-10-CM

## 2019-01-12 DIAGNOSIS — I70269 Atherosclerosis of native arteries of extremities with gangrene, unspecified extremity: Secondary | ICD-10-CM

## 2019-01-12 DIAGNOSIS — I779 Disorder of arteries and arterioles, unspecified: Secondary | ICD-10-CM

## 2019-01-14 ENCOUNTER — Encounter: Payer: Self-pay | Admitting: Vascular Surgery

## 2019-01-14 ENCOUNTER — Ambulatory Visit (HOSPITAL_COMMUNITY)
Admission: RE | Admit: 2019-01-14 | Discharge: 2019-01-14 | Disposition: A | Discharge status: Home / Self Care | Payer: Medicare Other | Source: Ambulatory Visit | Attending: Vascular Surgery | Admitting: Vascular Surgery

## 2019-01-14 ENCOUNTER — Ambulatory Visit (INDEPENDENT_AMBULATORY_CARE_PROVIDER_SITE_OTHER): Payer: Medicare Other | Admitting: Vascular Surgery

## 2019-01-14 ENCOUNTER — Ambulatory Visit (INDEPENDENT_AMBULATORY_CARE_PROVIDER_SITE_OTHER)
Admission: RE | Admit: 2019-01-14 | Discharge: 2019-01-14 | Disposition: A | Discharge status: Home / Self Care | Payer: Medicare Other | Source: Ambulatory Visit | Attending: Vascular Surgery | Admitting: Vascular Surgery

## 2019-01-14 ENCOUNTER — Other Ambulatory Visit: Payer: Self-pay

## 2019-01-14 VITALS — BP 135/71 | HR 80 | Temp 97.6°F | Resp 20 | Ht 72.0 in | Wt 266.0 lb

## 2019-01-14 DIAGNOSIS — I779 Disorder of arteries and arterioles, unspecified: Secondary | ICD-10-CM | POA: Diagnosis not present

## 2019-01-14 DIAGNOSIS — I70269 Atherosclerosis of native arteries of extremities with gangrene, unspecified extremity: Secondary | ICD-10-CM

## 2019-01-14 DIAGNOSIS — I739 Peripheral vascular disease, unspecified: Secondary | ICD-10-CM

## 2019-01-14 NOTE — Progress Notes (Signed)
Patient name: Alexander [name] MRN: [id number] DOB: 1961-08-15 Sex: male  REASON FOR VISIT:   Follow-up of peripheral vascular disease.  HPI:   Alexander [name] is a pleasant 58 y.o. male who underwent a right superficial femoral artery to below-knee popliteal artery bypass with a vein graft and amputation of the right fourth and fifth toes in October 2018.  I last saw him on 07/16/2018 at which point his bypass graft was patent and his amputation sites had almost completely healed.  Comes in for 47-monthfollow-up visit.  Of note he is on Eliquis and also has a history of a pacemaker.  10/27/2013: The patient had a left common femoral to tibioperoneal trunk bypass with a vein graft and ray amputation of the left fourth toe and debridement of the lateral foot.  He ultimately required a left transmetatarsal amputation.  09/10/2017: The patient had a right superficial femoral artery to below-knee popliteal artery bypass with a vein graft and ray amputation of the right fourth and fifth toes.  Since I saw him last, he denies any history of claudication or rest pain.  He is not a smoker.  There have been no significant changes in his medical history.  Past Medical History:  Diagnosis Date  . Alcohol abuse   . Anemia   . Arthritis   . Atrial fibrillation ([place]   . Cardiac syncope 10/07/14   rapid A fib with 8 sec pauses on converison with syncope- temp pacing wire placed then PPM  . Cataract    BILATERAL  . Chronic kidney disease   . Coronary artery disease   . Diabetes mellitus    dx---been  awhile  . Hyperlipemia   . Hypertension   . Peripheral vascular disease ([place]   . Presence of permanent cardiac pacemaker 10/08/2014    Family History  Problem Relation Age of Onset  . Diabetes type II Mother   . Hypertension Mother   . Diabetes Mother   . Liver cancer Father   . Diabetes type II Sister   . Breast cancer Sister   . Diabetes Sister   . Hypertension Sister   . Diabetes type II  Brother   . Kidney failure Brother   . Diabetes Brother   . Hypertension Brother   . Diabetes type II Sister     SOCIAL HISTORY: Social History   Tobacco Use  . Smoking status: Former Smoker    Packs/day: 1.50    Years: 30.00    Pack years: 45.00    Types: Cigars    Last attempt to quit: 10/07/2014    Years since quitting: 4.2  . Smokeless tobacco: Never Used  Substance Use Topics  . Alcohol use: Yes    Alcohol/week: 35.0 standard drinks    Types: 35 Cans of beer per week    Comment: 6 of 12 oz beers a day    Allergies  Allergen Reactions  . Pletal [Cilostazol] Palpitations    "Can hear heart beating loudly".  . Sulfa Antibiotics Rash    Current Outpatient Medications  Medication Sig Dispense Refill  . ACCU-CHEK FASTCLIX LANCETS MISC Check blood sugar TID & QHS 102 each 2  . amLODipine (NORVASC) 10 MG tablet Take 10 mg by mouth daily.   1  . apixaban (ELIQUIS) 5 MG TABS tablet Take 1 tablet (5 mg total) by mouth 2 (two) times daily. 180 tablet 3  . aspirin 81 MG chewable tablet Chew 81 mg by mouth daily.     .Marland Kitchen  atorvastatin (LIPITOR) 20 MG tablet Take 20 tablets by mouth daily.  2  . blood glucose meter kit and supplies KIT Check blood sugar TID & QHS 1 each 0  . Blood Glucose Monitoring Suppl (ACCU-CHEK ADVANTAGE DIABETES) kit Use as instructed 1 each 0  . chlorthalidone (HYGROTON) 50 MG tablet Take 50 mg by mouth daily.    . collagenase (SANTYL) ointment Apply 1 application topically daily. 30 g 1  . furosemide (LASIX) 40 MG tablet   1  . glucose blood (ACCU-CHEK AVIVA PLUS) test strip Use as instructed 100 each 12  . glucose blood (CHOICE DM FORA G20 TEST STRIPS) test strip Check blood sugar TID & QHS 100 each 12  . glucose blood test strip Use as instructed 100 each 12  . hydrALAZINE (APRESOLINE) 100 MG tablet Take 100 mg by mouth 3 (three) times daily.    . Insulin NPH Isophane & Regular (NOVOLIN 70/30 [place]) Inject 46-56 Units into the skin See admin instructions.  Inject 56 units SQ in the morning and inject 46 units SQ in the evening    . lisinopril (PRINIVIL,ZESTRIL) 10 MG tablet Take 10 mg by mouth daily.   0  . metoprolol (LOPRESSOR) 100 MG tablet Take 100 mg by mouth 2 (two) times daily.    . Multiple Vitamin (MULTIVITAMIN WITH MINERALS) TABS tablet Take 1 tablet by mouth daily.     No current facility-administered medications for this visit.     REVIEW OF SYSTEMS:  _0  denotes positive finding, _1  denotes negative finding Cardiac  Comments:  Chest pain or chest pressure:    Shortness of breath upon exertion: x   Short of breath when lying flat:    Irregular heart rhythm:        Vascular    Pain in calf, thigh, or hip brought on by ambulation:    Pain in feet at night that wakes you up from your sleep:     Blood clot in your veins:    Leg swelling:         Pulmonary    Oxygen at home:    Productive cough:     Wheezing:         Neurologic    Sudden weakness in arms or legs:     Sudden numbness in arms or legs:     Sudden onset of difficulty speaking or slurred speech:    Temporary loss of vision in one eye:     Problems with dizziness:         Gastrointestinal    Blood in stool:     Vomited blood:         Genitourinary    Burning when urinating:     Blood in urine:        Psychiatric    Major depression:         Hematologic    Bleeding problems:    Problems with blood clotting too easily:        Skin    Rashes or ulcers:        Constitutional    Fever or chills:     PHYSICAL EXAM:   Vitals:   01/14/19 1043  BP: 135/71  Pulse: 80  Resp: 20  Temp: 97.6 F (36.4 C)  SpO2: 95%  Weight: 266 lb (120.7 kg)  Height: 6' (1.829 m)   GENERAL: The patient is a well-nourished male, in no acute distress. The vital signs are documented above. CARDIAC: There is  a regular rate and rhythm.  VASCULAR: I do not detect carotid bruits. He has palpable femoral pulses. I cannot palpate pedal pulses. He has mild bilateral  lower extremity swelling PULMONARY: There is good air exchange bilaterally without wheezing or rales. ABDOMEN: Soft and non-tender with normal pitched bowel sounds.  MUSCULOSKELETAL: He has a transmetatarsal amputation on the left.  He has a fourth and fifth toe amputation on the right. NEUROLOGIC: No focal weakness or paresthesias are detected. SKIN: There are no ulcers or rashes noted. PSYCHIATRIC: The patient has a normal affect.  DATA:    GRAFT DUPLEX: I have independently interpreted his graft duplex scan today.  His right femoral below-knee pop bypass is widely patent without evidence of significant stenosis.  There are some mildly elevated velocities proximal to the graft.  His left femoral tibial bypass graft is also patent.  ARTERIAL DOPPLER STUDY: I have independently interpreted his arterial Doppler study today.  On the right side he has a triphasic posterior tibial and dorsalis pedis signal with an ABI of 100%.  Toe pressures 118 mmHg.  On the left side he has a biphasic posterior tibial signal with a monophasic dorsalis pedis signal.  ABI is 86%.  MEDICAL ISSUES:   PERIPHERAL VASCULAR DISEASE: This patient is undergone a previous left femoral to tibial peroneal trunk bypass with vein and a right femoral to below-knee popliteal artery bypass with vein.  He has healed the wounds on both feet and has a transmetatarsal amputation of the left and fourth and fifth toe amputation on the right.  He is asymptomatic.  He is quit smoking.  His bypass grafts are widely patent.  I encouraged him to stay as active as possible.  I have ordered a follow-up studies in 1 year.  I ordered ABIs and also a graft duplex bilaterally.  He knows to call sooner if he has problems.  [name] [facility] [phone]

## 2019-02-06 ENCOUNTER — Other Ambulatory Visit: Payer: Self-pay

## 2019-02-06 ENCOUNTER — Encounter (HOSPITAL_COMMUNITY)
Admission: RE | Admit: 2019-02-06 | Discharge: 2019-02-06 | Disposition: A | Discharge status: Home / Self Care | Payer: Medicare Other | Source: Ambulatory Visit | Attending: Nephrology | Admitting: Nephrology

## 2019-02-06 DIAGNOSIS — N183 Chronic kidney disease, stage 3 unspecified: Secondary | ICD-10-CM

## 2019-02-06 DIAGNOSIS — D631 Anemia in chronic kidney disease: Secondary | ICD-10-CM | POA: Insufficient documentation

## 2019-02-06 DIAGNOSIS — E1122 Type 2 diabetes mellitus with diabetic chronic kidney disease: Secondary | ICD-10-CM | POA: Insufficient documentation

## 2019-02-06 LAB — IRON AND TIBC
Iron: 89 ug/dL (ref 45–182)
Saturation Ratios: 33 % (ref 17.9–39.5)
TIBC: 270 ug/dL (ref 250–450)
UIBC: 181 ug/dL

## 2019-02-06 LAB — FERRITIN: Ferritin: 208 ng/mL (ref 24–336)

## 2019-02-06 MED ORDER — DARBEPOETIN ALFA 150 MCG/0.3ML IJ SOSY
150.0000 ug | PREFILLED_SYRINGE | INTRAMUSCULAR | Status: DC
  Administered 2019-02-06: 150 ug via SUBCUTANEOUS

## 2019-02-06 MED ORDER — DARBEPOETIN ALFA 150 MCG/0.3ML IJ SOSY
PREFILLED_SYRINGE | INTRAMUSCULAR | Status: AC
  Administered 2019-02-06: 150 ug via SUBCUTANEOUS
  Filled 2019-02-06: qty 0.3

## 2019-02-09 LAB — POCT HEMOGLOBIN-HEMACUE: Hemoglobin: 10.6 g/dL — ABNORMAL LOW (ref 13.0–17.0)

## 2019-03-05 ENCOUNTER — Other Ambulatory Visit: Payer: Self-pay

## 2019-03-06 ENCOUNTER — Ambulatory Visit (HOSPITAL_COMMUNITY)
Admission: RE | Admit: 2019-03-06 | Discharge: 2019-03-06 | Disposition: A | Discharge status: Home / Self Care | Payer: Medicare Other | Source: Ambulatory Visit | Attending: Nephrology | Admitting: Nephrology

## 2019-03-06 VITALS — BP 178/72 | HR 80 | Temp 98.2°F | Resp 20

## 2019-03-06 DIAGNOSIS — E1122 Type 2 diabetes mellitus with diabetic chronic kidney disease: Secondary | ICD-10-CM | POA: Insufficient documentation

## 2019-03-06 DIAGNOSIS — D631 Anemia in chronic kidney disease: Secondary | ICD-10-CM | POA: Diagnosis not present

## 2019-03-06 DIAGNOSIS — N183 Chronic kidney disease, stage 3 unspecified: Secondary | ICD-10-CM

## 2019-03-06 LAB — POCT HEMOGLOBIN-HEMACUE: Hemoglobin: 10.6 g/dL — ABNORMAL LOW (ref 13.0–17.0)

## 2019-03-06 LAB — IRON AND TIBC
Iron: 79 ug/dL (ref 45–182)
Saturation Ratios: 30 % (ref 17.9–39.5)
TIBC: 262 ug/dL (ref 250–450)
UIBC: 183 ug/dL

## 2019-03-06 LAB — FERRITIN: Ferritin: 186 ng/mL (ref 24–336)

## 2019-03-06 MED ORDER — DARBEPOETIN ALFA 150 MCG/0.3ML IJ SOSY
150.0000 ug | PREFILLED_SYRINGE | INTRAMUSCULAR | Status: DC
  Administered 2019-03-06: 150 ug via SUBCUTANEOUS

## 2019-03-06 MED ORDER — DARBEPOETIN ALFA 150 MCG/0.3ML IJ SOSY
PREFILLED_SYRINGE | INTRAMUSCULAR | Status: AC
  Filled 2019-03-06: qty 0.3

## 2019-03-18 DIAGNOSIS — I495 Sick sinus syndrome: Secondary | ICD-10-CM | POA: Diagnosis not present

## 2019-03-18 DIAGNOSIS — Z950 Presence of cardiac pacemaker: Secondary | ICD-10-CM | POA: Diagnosis not present

## 2019-03-18 DIAGNOSIS — Z45018 Encounter for adjustment and management of other part of cardiac pacemaker: Secondary | ICD-10-CM | POA: Diagnosis not present

## 2019-03-31 ENCOUNTER — Other Ambulatory Visit: Payer: Self-pay | Admitting: Cardiology

## 2019-03-31 NOTE — Telephone Encounter (Signed)
Please fill

## 2019-04-01 DIAGNOSIS — Z7901 Long term (current) use of anticoagulants: Secondary | ICD-10-CM | POA: Diagnosis not present

## 2019-04-01 DIAGNOSIS — I25810 Atherosclerosis of coronary artery bypass graft(s) without angina pectoris: Secondary | ICD-10-CM | POA: Diagnosis not present

## 2019-04-01 DIAGNOSIS — E1151 Type 2 diabetes mellitus with diabetic peripheral angiopathy without gangrene: Secondary | ICD-10-CM | POA: Diagnosis not present

## 2019-04-01 DIAGNOSIS — I4821 Permanent atrial fibrillation: Secondary | ICD-10-CM | POA: Diagnosis not present

## 2019-04-03 ENCOUNTER — Other Ambulatory Visit: Payer: Self-pay

## 2019-04-03 ENCOUNTER — Encounter (HOSPITAL_COMMUNITY)
Admission: RE | Admit: 2019-04-03 | Discharge: 2019-04-03 | Disposition: A | Discharge status: Home / Self Care | Payer: Medicare Other | Source: Ambulatory Visit | Attending: Nephrology | Admitting: Nephrology

## 2019-04-03 VITALS — BP 171/68 | HR 62 | Temp 97.9°F | Resp 20

## 2019-04-03 DIAGNOSIS — N183 Chronic kidney disease, stage 3 unspecified: Secondary | ICD-10-CM

## 2019-04-03 DIAGNOSIS — E1122 Type 2 diabetes mellitus with diabetic chronic kidney disease: Secondary | ICD-10-CM | POA: Diagnosis not present

## 2019-04-03 DIAGNOSIS — D631 Anemia in chronic kidney disease: Secondary | ICD-10-CM | POA: Diagnosis not present

## 2019-04-03 LAB — IRON AND TIBC
Iron: 60 ug/dL (ref 45–182)
Saturation Ratios: 24 % (ref 17.9–39.5)
TIBC: 251 ug/dL (ref 250–450)
UIBC: 191 ug/dL

## 2019-04-03 LAB — POCT HEMOGLOBIN-HEMACUE: Hemoglobin: 10.3 g/dL — ABNORMAL LOW (ref 13.0–17.0)

## 2019-04-03 LAB — FERRITIN: Ferritin: 132 ng/mL (ref 24–336)

## 2019-04-03 MED ORDER — DARBEPOETIN ALFA 150 MCG/0.3ML IJ SOSY
150.0000 ug | PREFILLED_SYRINGE | INTRAMUSCULAR | Status: DC
  Administered 2019-04-03: 150 ug via SUBCUTANEOUS

## 2019-04-03 MED ORDER — DARBEPOETIN ALFA 150 MCG/0.3ML IJ SOSY
PREFILLED_SYRINGE | INTRAMUSCULAR | Status: AC
  Filled 2019-04-03: qty 0.3

## 2019-04-27 ENCOUNTER — Other Ambulatory Visit: Payer: Self-pay | Admitting: Cardiology

## 2019-04-27 DIAGNOSIS — I495 Sick sinus syndrome: Secondary | ICD-10-CM

## 2019-05-01 ENCOUNTER — Encounter (HOSPITAL_COMMUNITY)
Admission: RE | Admit: 2019-05-01 | Discharge: 2019-05-01 | Disposition: A | Discharge status: Home / Self Care | Payer: Medicare Other | Source: Ambulatory Visit | Attending: Nephrology | Admitting: Nephrology

## 2019-05-01 ENCOUNTER — Other Ambulatory Visit: Payer: Self-pay

## 2019-05-01 VITALS — BP 173/57 | HR 62 | Temp 98.1°F | Resp 18

## 2019-05-01 DIAGNOSIS — D631 Anemia in chronic kidney disease: Secondary | ICD-10-CM | POA: Insufficient documentation

## 2019-05-01 DIAGNOSIS — E1122 Type 2 diabetes mellitus with diabetic chronic kidney disease: Secondary | ICD-10-CM | POA: Diagnosis not present

## 2019-05-01 DIAGNOSIS — N183 Chronic kidney disease, stage 3 unspecified: Secondary | ICD-10-CM

## 2019-05-01 LAB — POCT HEMOGLOBIN-HEMACUE: Hemoglobin: 10.2 g/dL — ABNORMAL LOW (ref 13.0–17.0)

## 2019-05-01 LAB — IRON AND TIBC
Iron: 71 ug/dL (ref 45–182)
Saturation Ratios: 28 % (ref 17.9–39.5)
TIBC: 253 ug/dL (ref 250–450)
UIBC: 182 ug/dL

## 2019-05-01 LAB — FERRITIN: Ferritin: 161 ng/mL (ref 24–336)

## 2019-05-01 MED ORDER — DARBEPOETIN ALFA 150 MCG/0.3ML IJ SOSY
150.0000 ug | PREFILLED_SYRINGE | INTRAMUSCULAR | Status: DC
  Administered 2019-05-01: 09:00:00 150 ug via SUBCUTANEOUS

## 2019-05-25 ENCOUNTER — Other Ambulatory Visit: Payer: Self-pay | Admitting: Cardiology

## 2019-05-29 ENCOUNTER — Ambulatory Visit: Payer: Medicare Other | Admitting: Cardiology

## 2019-05-29 ENCOUNTER — Encounter: Payer: Self-pay | Admitting: Cardiology

## 2019-05-29 ENCOUNTER — Ambulatory Visit (HOSPITAL_COMMUNITY)
Admission: RE | Admit: 2019-05-29 | Discharge: 2019-05-29 | Disposition: A | Discharge status: Home / Self Care | Payer: Medicare Other | Source: Ambulatory Visit | Attending: Nephrology | Admitting: Nephrology

## 2019-05-29 ENCOUNTER — Other Ambulatory Visit: Payer: Self-pay

## 2019-05-29 VITALS — BP 156/63 | HR 65 | Temp 97.9°F | Resp 18

## 2019-05-29 VITALS — BP 150/63 | Ht 72.0 in | Wt 267.0 lb

## 2019-05-29 DIAGNOSIS — N183 Chronic kidney disease, stage 3 unspecified: Secondary | ICD-10-CM

## 2019-05-29 DIAGNOSIS — Z950 Presence of cardiac pacemaker: Secondary | ICD-10-CM

## 2019-05-29 DIAGNOSIS — D631 Anemia in chronic kidney disease: Secondary | ICD-10-CM | POA: Insufficient documentation

## 2019-05-29 DIAGNOSIS — E1122 Type 2 diabetes mellitus with diabetic chronic kidney disease: Secondary | ICD-10-CM | POA: Insufficient documentation

## 2019-05-29 DIAGNOSIS — I739 Peripheral vascular disease, unspecified: Secondary | ICD-10-CM

## 2019-05-29 DIAGNOSIS — I480 Paroxysmal atrial fibrillation: Secondary | ICD-10-CM | POA: Diagnosis not present

## 2019-05-29 DIAGNOSIS — I4729 Other ventricular tachycardia: Secondary | ICD-10-CM

## 2019-05-29 DIAGNOSIS — I10 Essential (primary) hypertension: Secondary | ICD-10-CM

## 2019-05-29 DIAGNOSIS — I2510 Atherosclerotic heart disease of native coronary artery without angina pectoris: Secondary | ICD-10-CM | POA: Diagnosis not present

## 2019-05-29 DIAGNOSIS — I472 Ventricular tachycardia: Secondary | ICD-10-CM

## 2019-05-29 DIAGNOSIS — Z789 Other specified health status: Secondary | ICD-10-CM

## 2019-05-29 DIAGNOSIS — Z7289 Other problems related to lifestyle: Secondary | ICD-10-CM

## 2019-05-29 LAB — IRON AND TIBC
Iron: 72 ug/dL (ref 45–182)
Saturation Ratios: 28 % (ref 17.9–39.5)
TIBC: 256 ug/dL (ref 250–450)
UIBC: 184 ug/dL

## 2019-05-29 LAB — POCT HEMOGLOBIN-HEMACUE: Hemoglobin: 10.4 g/dL — ABNORMAL LOW (ref 13.0–17.0)

## 2019-05-29 LAB — FERRITIN: Ferritin: 171 ng/mL (ref 24–336)

## 2019-05-29 MED ORDER — DARBEPOETIN ALFA 150 MCG/0.3ML IJ SOSY
PREFILLED_SYRINGE | INTRAMUSCULAR | Status: AC
  Administered 2019-05-29: 150 ug via SUBCUTANEOUS
  Filled 2019-05-29: qty 0.3

## 2019-05-29 MED ORDER — DARBEPOETIN ALFA 150 MCG/0.3ML IJ SOSY
150.0000 ug | PREFILLED_SYRINGE | INTRAMUSCULAR | Status: DC
  Administered 2019-05-29: 09:00:00 150 ug via SUBCUTANEOUS

## 2019-05-29 MED ORDER — APIXABAN 5 MG PO TABS: 5.0000 mg | ORAL_TABLET | Freq: Two times a day (BID) | ORAL | 3 refills | Status: DC

## 2019-05-29 NOTE — Progress Notes (Signed)
Primary Physician:  [name], MD   Patient ID: Alexander [name], male    DOB: Mar 01, 1961, 58 y.o.   MRN: [id number]  Subjective:    Chief Complaint  Patient presents with  . PAD  . Hypertension  . Follow-up    45mh   This visit type was conducted due to national recommendations for restrictions regarding the COVID-19 Pandemic (e.g. social distancing).  This format is felt to be most appropriate for this patient at this time.  All issues noted in this document were discussed and addressed.  No physical exam was performed (except for noted visual exam findings with Telehealth visits).  The patient has consented to conduct a Telehealth visit and understands insurance will be billed.   I discussed the limitations of evaluation and management by telemedicine and the availability of in person appointments. The patient expressed understanding and agreed to proceed.  Virtual Visit via Video Note is as below  I connected with [name] on 05/29/19 at 1005 by telephone and verified that I am speaking with the correct person using two identifiers. Unable to perform video visit as patient did not have equipment.    I have discussed with the patient regarding the safety during COVID Pandemic and steps and precautions including social distancing with the patient.    HPI: Alexander [name] is a 58y.o. male  with paroxysmal atrial fibrillation noted on pacemaker transmission, complete heart block leading to Medtronic pacemaker implantation on 10/07/2014, coronary artery disease with angioplasty and stenting to the LAD and dominant circumflex in December 2015, diffusely diseased small RCA, uncontrolled diabetes mellitus with peripheral arterial disease being followed by vascular and is had left metatarsal amputation in 2014. He was admitted to the hospital on 09/16/2017 with right foot ulceration, he underwent thrombectomy followed by redo right below-knee popliteal artery bypass surgery by Dr.  DDoren Custard   Patient is here for follow-up for CAD and PAF. In office pacercheck in Jan 2020 revealed 30 sec of A fib; otherwise, no arrhythmias. Normal pacer function and he is pacer dependent. He is presently doing well without any complaints today. Denies any chest pain. Has chronic dyspnea on exertion that has remained unchanged. He has been monitored regularly by Dr. DDoren Custard previously had right lateral foot ulceration that is now healed. He has seen Dr. DDoren Custardin March and graft duplex was widely patent. ABI on right was 1.0 and left ABI was 0.86. He is to follow up in 1 year. No symptoms of claudication.  He reports that diabetes has been well-controlled, last hemoglobin A1c in approximately 7%.  He also continues to be followed by Dr. MEdrick Ohfor chronic renal failure and iron deficiency anemia requiring monthly injections.  He has remained abstinent from tobacco use. Does admit to drinking 6 beers every other day.    Past Medical History:  Diagnosis Date  . Alcohol abuse   . Anemia   . Arthritis   . Atrial fibrillation ([place]   . Cardiac syncope 10/07/14   rapid A fib with 8 sec pauses on converison with syncope- temp pacing wire placed then PPM  . Cataract    BILATERAL  . Chronic kidney disease   . Coronary artery disease   . Diabetes mellitus    dx---been  awhile  . Hyperlipemia   . Hypertension   . Peripheral vascular disease ([place]   . Presence of permanent cardiac pacemaker 10/08/2014    Past Surgical History:  Procedure Laterality Date  .  ABDOMINAL AORTOGRAM W/LOWER EXTREMITY N/A 09/09/2017   Procedure: ABDOMINAL AORTOGRAM W/LOWER EXTREMITY;  Surgeon: Angelia Mould, MD;  Location: [place] [facility];  Service: Cardiovascular;  Laterality: N/A;  . AMPUTATION Left 08/19/2013   Procedure: AMPUTATION RAY;  Surgeon: Alta Corning, MD;  Location: [place];  Service: Orthopedics;  Laterality: Left;  ray amputation left 5th  . AMPUTATION Left 10/27/2013   Procedure:  AMPUTATION DIGIT-LEFT 4TH TOE, 4th and 5th metatarsal.;  Surgeon: Angelia Mould, MD;  Location: [place];  Service: Vascular;  Laterality: Left;  . AMPUTATION Left 11/04/2013   Procedure: LEFT FOOT TRANS-METATARSAL AMPUTATION WITH WOUND CLOSURE ;  Surgeon: Alta Corning, MD;  Location: [place];  Service: Orthopedics;  Laterality: Left;  . AMPUTATION Right 09/10/2017   Procedure: RIGHT FOURTH AND FIFTH TOE AMPUTATION;  Surgeon: Angelia Mould, MD;  Location: [place];  Service: Vascular;  Laterality: Right;  . CARDIAC CATHETERIZATION  10/07/2014   Procedure: LEFT HEART CATH AND CORONARY ANGIOGRAPHY;  Surgeon: Laverda Page, MD;  Location: [facility] CATH LAB;  Service: Cardiovascular;;  . EMBOLECTOMY Right 09/12/2017   Procedure: Thrombectomy  & Redo Right Below Knee Popliteal Artery Bypass Graft  ;  Surgeon: Waynetta Sandy, MD;  Location: [place];  Service: Vascular;  Laterality: Right;  . FEMORAL-TIBIAL BYPASS GRAFT Left 10/27/2013   Procedure: BYPASS GRAFT LEFT FEMORAL- POSTERIOR TIBIAL ARTERY;  Surgeon: Angelia Mould, MD;  Location: [place];  Service: Vascular;  Laterality: Left;  . FEMORAL-TIBIAL BYPASS GRAFT Right 09/10/2017   Procedure: RIGHT SUPERFICIAL  FEMORAL ARTERY-BELOW KNEE POPLITEAL ARTERY BYPASS GRAFT WITH VEIN;  Surgeon: Angelia Mould, MD;  Location: [place];  Service: Vascular;  Laterality: Right;  . I&D EXTREMITY Left 10/27/2013   Procedure: IRRIGATION AND DEBRIDEMENT EXTREMITY- LEFT FOOT;  Surgeon: Angelia Mould, MD;  Location: [place];  Service: Vascular;  Laterality: Left;  . INTRAOPERATIVE ARTERIOGRAM Right 09/10/2017   Procedure: INTRA OPERATIVE ARTERIOGRAM;  Surgeon: Angelia Mould, MD;  Location: [place];  Service: Vascular;  Laterality: Right;  . IR ANGIOGRAM FOLLOW UP STUDY  09/09/2017  . LEFT HEART CATHETERIZATION WITH CORONARY ANGIOGRAM N/A 11/09/2014   Procedure: LEFT HEART CATHETERIZATION WITH CORONARY ANGIOGRAM;  Surgeon:  Laverda Page, MD;  Location: [facility] CATH LAB;  Service: Cardiovascular;  Laterality: N/A;  . LOWER EXTREMITY ANGIOGRAM Right 11/08/2015   Procedure: Lower Extremity Angiogram;  Surgeon: Serafina Mitchell, MD;  Location: [place] [facility];  Service: Cardiovascular;  Laterality: Right;  . LUMBAR LAMINECTOMY  06/13/2012   Procedure: MICRODISCECTOMY LUMBAR LAMINECTOMY;  Surgeon: Jessy Oto, MD;  Location: [place];  Service: Orthopedics;  Laterality: N/A;  Central laminectomy L2-3, L3-4, L4-5  . PACEMAKER INSERTION  10/08/14   MDT Advisa MRI compatible dual chamber pacemaker implanted by Dr Caryl Comes for syncope with post-termination pauses  . PERCUTANEOUS CORONARY STENT INTERVENTION (PCI-S)  11/09/2014   des to lad & distal circumflex         Dr  [name]  . PERIPHERAL VASCULAR CATHETERIZATION N/A 11/08/2015   Procedure: Abdominal Aortogram;  Surgeon: Serafina Mitchell, MD;  Location: [place] [facility];  Service: Cardiovascular;  Laterality: N/A;  . PERIPHERAL VASCULAR CATHETERIZATION Right 11/08/2015   Procedure: Peripheral Vascular Atherectomy;  Surgeon: Serafina Mitchell, MD;  Location: [place] [facility];  Service: Cardiovascular;  Laterality: Right;  . PERIPHERAL VASCULAR CATHETERIZATION N/A 10/10/2016   Procedure: Lower Extremity Angiography;  Surgeon: Waynetta Sandy, MD;  Location: [place]  [facility];  Service: Cardiovascular;  Laterality: N/A;  . PERIPHERAL VASCULAR CATHETERIZATION Right 10/10/2016   Procedure: Peripheral Vascular Atherectomy;  Surgeon: Waynetta Sandy, MD;  Location: [place] [facility];  Service: Cardiovascular;  Laterality: Right;  Popliteal  . PERMANENT PACEMAKER INSERTION N/A 10/08/2014   Procedure: PERMANENT PACEMAKER INSERTION;  Surgeon: Deboraha Sprang, MD;  Location: [facility] CATH LAB;  Service: Cardiovascular;  Laterality: N/A;  . TEMPORARY PACEMAKER INSERTION Bilateral 10/07/2014   Procedure: TEMPORARY PACEMAKER INSERTION;  Surgeon: Laverda Page, MD;   Location: [facility] CATH LAB;  Service: Cardiovascular;  Laterality: Bilateral;    Social History   Socioeconomic History  . Marital status: Married    Spouse name: Not on file  . Number of children: 2  . Years of education: Not on file  . Highest education level: Not on file  Occupational History  . Occupation: Disabled  Social Needs  . Financial resource strain: Not on file  . Food insecurity    Worry: Not on file    Inability: Not on file  . Transportation needs    Medical: Not on file    Non-medical: Not on file  Tobacco Use  . Smoking status: Former Smoker    Packs/day: 1.50    Years: 30.00    Pack years: 45.00    Types: Cigars    Quit date: 10/07/2014    Years since quitting: 4.6  . Smokeless tobacco: Never Used  Substance and Sexual Activity  . Alcohol use: Yes    Alcohol/week: 35.0 standard drinks    Types: 35 Cans of beer per week    Comment: 6 of 12 oz beers a day  . Drug use: No  . Sexual activity: Not on file  Lifestyle  . Physical activity    Days per week: Not on file    Minutes per session: Not on file  . Stress: Not on file  Relationships  . Social Herbalist on phone: Not on file    Gets together: Not on file    Attends religious service: Not on file    Active member of club or organization: Not on file    Attends meetings of clubs or organizations: Not on file    Relationship status: Not on file  . Intimate partner violence    Fear of current or ex partner: Not on file    Emotionally abused: Not on file    Physically abused: Not on file    Forced sexual activity: Not on file  Other Topics Concern  . Not on file  Social History Narrative  . Not on file    Review of Systems  Constitution: Negative for decreased appetite, malaise/fatigue, weight gain and weight loss.  Eyes: Negative for visual disturbance.  Cardiovascular: Negative for chest pain, claudication, dyspnea on exertion, leg swelling, orthopnea, palpitations and syncope.   Respiratory: Negative for hemoptysis and wheezing.   Endocrine: Negative for cold intolerance and heat intolerance.  Hematologic/Lymphatic: Does not bruise/bleed easily.  Skin: Negative for nail changes.  Musculoskeletal: Negative for muscle weakness and myalgias.  Gastrointestinal: Negative for abdominal pain, change in bowel habit, nausea and vomiting.  Neurological: Negative for difficulty with concentration, dizziness, focal weakness and headaches.  Psychiatric/Behavioral: Negative for altered mental status and suicidal ideas.  All other systems reviewed and are negative.     Objective:  Blood pressure (!) 150/63, height 6' (1.829 m), weight 267 lb (121.1 kg). Body mass index is 36.21 kg/m.  Physical exam not performed or limited due to virtual visit.    Please see exam details from prior visit is as below.   Physical Exam  Constitutional: He is oriented to person, place, and time. Vital signs are normal. He appears well-developed and well-nourished.  HENT:  Head: Normocephalic and atraumatic.  Neck: Normal range of motion.  Cardiovascular: Normal rate, regular rhythm, normal heart sounds and intact distal pulses.  Pulses:      Femoral pulses are 1+ on the right side with bruit and 1+ on the left side with bruit.      Popliteal pulses are 0 on the right side and 0 on the left side.       Dorsalis pedis pulses are 0 on the right side and 0 on the left side.       Posterior tibial pulses are 0 on the right side and 0 on the left side.  Paradoxical split S2  Bilateral ischemic changes: Left SIMS amputation, left fem-pop bypass, right 4&5 toe amputation and right fem pop bypass surgical scar noted.  Trace edema  Pulmonary/Chest: Effort normal and breath sounds normal. No accessory muscle usage. No respiratory distress.  Abdominal: Soft. Bowel sounds are normal.  Musculoskeletal: Normal range of motion.  Neurological: He is alert and oriented to person, place, and time.   Skin: Skin is warm and dry.  Vitals reviewed.  Radiology: No results found.  Laboratory examination:    CMP Latest Ref Rng & Units 09/16/2017 09/15/2017 09/14/2017  Glucose 65 - 99 mg/dL 240(H) 232(H) 254(H)  BUN 6 - 20 mg/dL 46(H) 59(H) 65(H)  Creatinine 0.61 - 1.24 mg/dL 1.68(H) 1.92(H) 2.28(H)  Sodium 135 - 145 mmol/L 137 136 133(L)  Potassium 3.5 - 5.1 mmol/L 4.3 4.6 4.7  Chloride 101 - 111 mmol/L 110 109 105  CO2 22 - 32 mmol/L 21(L) 20(L) 20(L)  Calcium 8.9 - 10.3 mg/dL 8.3(L) 8.3(L) 8.3(L)  Total Protein 6.5 - 8.1 g/dL - - -  Total Bilirubin 0.3 - 1.2 mg/dL - - -  Alkaline Phos 38 - 126 U/L - - -  AST 15 - 41 U/L - - -  ALT 17 - 63 U/L - - -   CBC Latest Ref Rng & Units 05/01/2019 04/03/2019 03/06/2019  WBC 4.0 - 10.5 K/uL - - -  Hemoglobin 13.0 - 17.0 g/dL 10.2(L) 10.3(L) 10.6(L)  Hematocrit 39.0 - 52.0 % - - -  Platelets 150 - 400 K/uL - - -   Lipid Panel     Component Value Date/Time   CHOL 147 11/20/2013 1422   TRIG 160 (H) 11/20/2013 1422   HDL 30 (L) 11/20/2013 1422   CHOLHDL 4.9 11/20/2013 1422   VLDL 32 11/20/2013 1422   LDLCALC 85 11/20/2013 1422   HEMOGLOBIN A1C Lab Results  Component Value Date   HGBA1C 6.5 (H) 09/10/2017   MPG 139.85 09/10/2017   TSH No results for input(s): TSH in the last 8760 hours.  PRN Meds:. Medications Discontinued During This Encounter  Medication Reason  . collagenase (SANTYL) ointment Error   Current Meds  Medication Sig  . ACCU-CHEK FASTCLIX LANCETS MISC Check blood sugar TID & QHS  . amLODipine (NORVASC) 10 MG tablet Take 10 mg by mouth daily.   Marland Kitchen aspirin 81 MG chewable tablet Chew 81 mg by mouth daily.   Marland Kitchen atorvastatin (LIPITOR) 20 MG tablet Take 1 tablet by mouth once daily  . blood glucose meter kit and supplies KIT Check blood sugar TID &  QHS  . Blood Glucose Monitoring Suppl (ACCU-CHEK ADVANTAGE DIABETES) kit Use as instructed  . chlorthalidone (HYGROTON) 50 MG tablet Take 50 mg by mouth daily.  Marland Kitchen ELIQUIS 5  MG TABS tablet Take 1 tablet by mouth twice daily  . furosemide (LASIX) 40 MG tablet   . glucose blood (ACCU-CHEK AVIVA PLUS) test strip Use as instructed  . glucose blood (CHOICE DM FORA G20 TEST STRIPS) test strip Check blood sugar TID & QHS  . glucose blood test strip Use as instructed  . hydrALAZINE (APRESOLINE) 100 MG tablet Take 100 mg by mouth 3 (three) times daily.  . Insulin NPH Isophane & Regular (NOVOLIN 70/30 [place]) Inject 46-56 Units into the skin See admin instructions. Inject 56 units SQ in the morning and inject 48 units SQ in the evening  . lisinopril (ZESTRIL) 10 MG tablet Take 1 tablet by mouth once daily  . metoprolol tartrate (LOPRESSOR) 100 MG tablet Take 1 tablet by mouth twice daily  . Multiple Vitamin (MULTIVITAMIN WITH MINERALS) TABS tablet Take 1 tablet by mouth daily.    Cardiac Studies:   Scheduled In Person Pacemaker Check 11/27/2018: Battery life >5 years. Normal pacemaker function, normal pacemaker thresholds. AP >55%. Brief AT/AF for 30 seconds, no VHR episodes. No further Sustained atrial fibrillation with controlled ventricular response 01/24/2017 for 12 hours.  Lexiscan myoview stress test 01/27/2016: 1. The resting electrocardiogram demonstrated normal sinus rhythm, normal resting conduction, no resting arrhythmias and normal rest repolarization. Stress EKG is non-diagnostic for ischemia as it a pharmacologic stress using Lexiscan. Stress symptoms included dyspnea. 2. The perfusion imaging study demonstrates a moderate-sized defect in the inferior wall extending from the base towards the apex, the defect slightly improved in stress images, suggests soft tissue attenuation. However scar in this region could not excluded. Left ventricular systolic function Calculated by QGS was 72%. This is a low risk study. Clinical correlation is recommended.  Echocardiogram 03/15/2016: Left ventricle cavity is normal in size. Severe concentric hypertrophy of the left ventricle.  Normal global wall motion. Normal diastolic filling pattern. Calculated EF 66%. Left atrial cavity is mildly dilated. Right ventricle cavity is normal in size. Normal right ventricular function. Pacemaker lead/ICD lead noted in the RV. Trace mitral regurgitation. Trace pulmonic regurgitation.  Coronary angiogram 11/09/2014: CSI diamont back atherectomy to Mid LAD and Distal dominant Cx and stenting 3.0x69m and 2.5x136mPromus DES. RCA long stenosis and small vessel (angio 10/07/14) left alone (medical management only). LVEF 60%.  SSS with tachy-brady syndrome S/P Medtronic advica DR (MRI compatible) dual-chamber pacemaker by Dr. KlCaryl Comesn 10/07/2014  Assessment:     ICD-10-CM   1. Atherosclerosis of native coronary artery of native heart without angina pectoris  I25.10   2. Essential hypertension  I10   3. Paroxysmal atrial fibrillation (HCC)  I48.0   4. Cardiac pacemaker in situ  Z95.0   5. PAD (peripheral artery disease) (HCC)  I73.9   6. NSVT (nonsustained ventricular tachycardia) (HCC)  I47.2   7. Alcohol use  Z72.89     EKG 02/26/2018: A paced rhythm with first-degree block at rate of 72 bpm, left axis deviation, left anterior fascicular block. Right bundle branch block. No significant change from EKG 02/05/2017.  CHA2DS2-VASc Score is 3 with yearly risk of stroke of 3.2%. HAS-Bled score is and estimated major bleeding in one year is 1.88-3.2%  Recommendations:   Patient is presently doing well without any symptoms of angina.  He was evaluated by Dr. DiDoren Custardn March, by lower  extremity studies, he was stable.  No symptoms of claudication.  He is to follow-up in 1 year with him.  He has paroxysmal atrial fibrillation, no recent episodes by remote transmission of pacemaker.  He did have 2 episodes of nonsustained VT.  He is currently on beta-blocker therapy, will continue the same.  No bleeding diathesis on anticoagulation, he request a refill on Eliquis which was provided today.  His  blood pressure is elevated, and by his records when presenting to the hospital for his injections for anemia it is also been elevated.  He admits to only taking hydralazine twice a day, will increase to 3 times a day.  Could consider addition of isosorbide.  He has not recently seen his PCP for labs, I have encouraged him to do this.  He admits to drinking approximately 6 beers every other day.  I have advised him to cut back on his alcohol use.  Effects of alcohol on his heart were discussed.  He is agreeable to this.  I will see him back in 6 weeks for telephone appointment to follow-up on his blood pressure.  Miquel Dunn, MSN, APRN, FNP-C [facility]. [place] Office: [phone] Fax: [phone]

## 2019-06-02 ENCOUNTER — Other Ambulatory Visit: Payer: Self-pay | Admitting: Cardiology

## 2019-06-17 DIAGNOSIS — Z45018 Encounter for adjustment and management of other part of cardiac pacemaker: Secondary | ICD-10-CM | POA: Diagnosis not present

## 2019-06-17 DIAGNOSIS — Z950 Presence of cardiac pacemaker: Secondary | ICD-10-CM | POA: Diagnosis not present

## 2019-06-17 DIAGNOSIS — I495 Sick sinus syndrome: Secondary | ICD-10-CM | POA: Diagnosis not present

## 2019-06-20 ENCOUNTER — Encounter: Payer: Self-pay | Admitting: Cardiology

## 2019-06-25 ENCOUNTER — Other Ambulatory Visit: Payer: Self-pay

## 2019-06-26 ENCOUNTER — Encounter (HOSPITAL_COMMUNITY)
Admission: RE | Admit: 2019-06-26 | Discharge: 2019-06-26 | Disposition: A | Discharge status: Home / Self Care | Payer: Medicare Other | Source: Ambulatory Visit | Attending: Nephrology | Admitting: Nephrology

## 2019-06-26 VITALS — BP 151/57 | HR 74 | Temp 97.9°F | Resp 18

## 2019-06-26 DIAGNOSIS — E1122 Type 2 diabetes mellitus with diabetic chronic kidney disease: Secondary | ICD-10-CM | POA: Diagnosis not present

## 2019-06-26 DIAGNOSIS — N183 Chronic kidney disease, stage 3 unspecified: Secondary | ICD-10-CM

## 2019-06-26 DIAGNOSIS — D631 Anemia in chronic kidney disease: Secondary | ICD-10-CM | POA: Diagnosis not present

## 2019-06-26 LAB — IRON AND TIBC
Iron: 66 ug/dL (ref 45–182)
Saturation Ratios: 29 % (ref 17.9–39.5)
TIBC: 231 ug/dL — ABNORMAL LOW (ref 250–450)
UIBC: 165 ug/dL

## 2019-06-26 LAB — FERRITIN: Ferritin: 189 ng/mL (ref 24–336)

## 2019-06-26 LAB — POCT HEMOGLOBIN-HEMACUE: Hemoglobin: 9.9 g/dL — ABNORMAL LOW (ref 13.0–17.0)

## 2019-06-26 MED ORDER — DARBEPOETIN ALFA 150 MCG/0.3ML IJ SOSY
PREFILLED_SYRINGE | INTRAMUSCULAR | Status: AC
  Administered 2019-06-26: 150 ug
  Filled 2019-06-26: qty 0.3

## 2019-06-26 MED ORDER — DARBEPOETIN ALFA 150 MCG/0.3ML IJ SOSY: 150.0000 ug | PREFILLED_SYRINGE | INTRAMUSCULAR | Status: DC

## 2019-06-27 ENCOUNTER — Other Ambulatory Visit: Payer: Self-pay | Admitting: Cardiology

## 2019-07-10 ENCOUNTER — Ambulatory Visit: Payer: Medicare Other | Admitting: Cardiology

## 2019-07-10 NOTE — Progress Notes (Deleted)
Primary Physician:  [name], MD   Patient ID: Alexander [name], male    DOB: 10/06/1961, 58 y.o.   MRN: [id number]  Subjective:    No chief complaint on file.   HPI: Alexander [name]  is a 58 y.o. male  with paroxysmal atrial fibrillation noted on pacemaker transmission, complete heart block leading to Medtronic pacemaker implantation on 10/07/2014, coronary artery disease with angioplasty and stenting to the LAD and dominant circumflex in December 2015, diffusely diseased small RCA, uncontrolled diabetes mellitus with peripheral arterial disease being followed by vascular and is had left metatarsal amputation in 2014. He was admitted to the hospital on 09/16/2017 with right foot ulceration, he underwent thrombectomy followed by redo right below-knee popliteal artery bypass surgery by Dr. Doren Custard.   Patient is here for follow-up for CAD, HTN, and PAF. In office pacercheck in Jan 2020 revealed 30 sec of A fib; otherwise, no arrhythmias. Normal pacer function and he is pacer dependent. Remote transmission in May sowed 2 episodes of NSVT for 5 and 8 beats. He is presently doing well without any complaints today. Denies any chest pain. Has chronic dyspnea on exertion that has remained unchanged. He has been monitored regularly by Dr. Doren Custard, previously had right lateral foot ulceration that is now healed. He has seen Dr. Doren Custard in March and graft duplex was widely patent. ABI on right was 1.0 and left ABI was 0.86. He is to follow up in 1 year. No symptoms of claudication.  He reports that diabetes has been well-controlled, last hemoglobin A1c in approximately 7%.  He also continues to be followed by Dr. Edrick Oh for chronic renal failure and iron deficiency anemia requiring monthly injections.  He has remained abstinent from tobacco use. Does admit to drinking 6 beers every other day.    Past Medical History:  Diagnosis Date  . Alcohol abuse   . Anemia   . Arthritis   . Atrial  fibrillation ([place])   . Cardiac syncope 10/07/14   rapid A fib with 8 sec pauses on converison with syncope- temp pacing wire placed then PPM  . Cataract    BILATERAL  . Chronic kidney disease   . Coronary artery disease   . Diabetes mellitus    dx---been  awhile  . Encounter for care of pacemaker 10/08/2014  . Hyperlipemia   . Hypertension   . Peripheral vascular disease ([place])   . Presence of permanent cardiac pacemaker 10/08/2014  . Sinus node dysfunction ([place]) 10/08/2014    Past Surgical History:  Procedure Laterality Date  . ABDOMINAL AORTOGRAM W/LOWER EXTREMITY N/A 09/09/2017   Procedure: ABDOMINAL AORTOGRAM W/LOWER EXTREMITY;  Surgeon: Angelia Mould, MD;  Location: [place] [facility];  Service: Cardiovascular;  Laterality: N/A;  . AMPUTATION Left 08/19/2013   Procedure: AMPUTATION RAY;  Surgeon: Alta Corning, MD;  Location: [place];  Service: Orthopedics;  Laterality: Left;  ray amputation left 5th  . AMPUTATION Left 10/27/2013   Procedure: AMPUTATION DIGIT-LEFT 4TH TOE, 4th and 5th metatarsal.;  Surgeon: Angelia Mould, MD;  Location: [place];  Service: Vascular;  Laterality: Left;  . AMPUTATION Left 11/04/2013   Procedure: LEFT FOOT TRANS-METATARSAL AMPUTATION WITH WOUND CLOSURE ;  Surgeon: Alta Corning, MD;  Location: [place];  Service: Orthopedics;  Laterality: Left;  . AMPUTATION Right 09/10/2017   Procedure: RIGHT FOURTH AND FIFTH TOE AMPUTATION;  Surgeon: Angelia Mould, MD;  Location: [place];  Service: Vascular;  Laterality: Right;  .  CARDIAC CATHETERIZATION  10/07/2014   Procedure: LEFT HEART CATH AND CORONARY ANGIOGRAPHY;  Surgeon: Laverda Page, MD;  Location: [facility] CATH LAB;  Service: Cardiovascular;;  . EMBOLECTOMY Right 09/12/2017   Procedure: Thrombectomy  & Redo Right Below Knee Popliteal Artery Bypass Graft  ;  Surgeon: Waynetta Sandy, MD;  Location: [place];  Service: Vascular;  Laterality: Right;  . FEMORAL-TIBIAL BYPASS GRAFT Left  10/27/2013   Procedure: BYPASS GRAFT LEFT FEMORAL- POSTERIOR TIBIAL ARTERY;  Surgeon: Angelia Mould, MD;  Location: [place];  Service: Vascular;  Laterality: Left;  . FEMORAL-TIBIAL BYPASS GRAFT Right 09/10/2017   Procedure: RIGHT SUPERFICIAL  FEMORAL ARTERY-BELOW KNEE POPLITEAL ARTERY BYPASS GRAFT WITH VEIN;  Surgeon: Angelia Mould, MD;  Location: [place];  Service: Vascular;  Laterality: Right;  . I&D EXTREMITY Left 10/27/2013   Procedure: IRRIGATION AND DEBRIDEMENT EXTREMITY- LEFT FOOT;  Surgeon: Angelia Mould, MD;  Location: [place];  Service: Vascular;  Laterality: Left;  . INTRAOPERATIVE ARTERIOGRAM Right 09/10/2017   Procedure: INTRA OPERATIVE ARTERIOGRAM;  Surgeon: Angelia Mould, MD;  Location: [place];  Service: Vascular;  Laterality: Right;  . IR ANGIOGRAM FOLLOW UP STUDY  09/09/2017  . LEFT HEART CATHETERIZATION WITH CORONARY ANGIOGRAM N/A 11/09/2014   Procedure: LEFT HEART CATHETERIZATION WITH CORONARY ANGIOGRAM;  Surgeon: Laverda Page, MD;  Location: [facility] CATH LAB;  Service: Cardiovascular;  Laterality: N/A;  . LOWER EXTREMITY ANGIOGRAM Right 11/08/2015   Procedure: Lower Extremity Angiogram;  Surgeon: Serafina Mitchell, MD;  Location: [place] [facility];  Service: Cardiovascular;  Laterality: Right;  . LUMBAR LAMINECTOMY  06/13/2012   Procedure: MICRODISCECTOMY LUMBAR LAMINECTOMY;  Surgeon: Jessy Oto, MD;  Location: [place];  Service: Orthopedics;  Laterality: N/A;  Central laminectomy L2-3, L3-4, L4-5  . PACEMAKER INSERTION  10/08/14   MDT Advisa MRI compatible dual chamber pacemaker implanted by Dr Caryl Comes for syncope with post-termination pauses  . PERCUTANEOUS CORONARY STENT INTERVENTION (PCI-S)  11/09/2014   des to lad & distal circumflex         Dr  [name]  . PERIPHERAL VASCULAR CATHETERIZATION N/A 11/08/2015   Procedure: Abdominal Aortogram;  Surgeon: Serafina Mitchell, MD;  Location: [place] [facility];  Service: Cardiovascular;  Laterality: N/A;  .  PERIPHERAL VASCULAR CATHETERIZATION Right 11/08/2015   Procedure: Peripheral Vascular Atherectomy;  Surgeon: Serafina Mitchell, MD;  Location: [place] [facility];  Service: Cardiovascular;  Laterality: Right;  . PERIPHERAL VASCULAR CATHETERIZATION N/A 10/10/2016   Procedure: Lower Extremity Angiography;  Surgeon: Waynetta Sandy, MD;  Location: [place] [facility];  Service: Cardiovascular;  Laterality: N/A;  . PERIPHERAL VASCULAR CATHETERIZATION Right 10/10/2016   Procedure: Peripheral Vascular Atherectomy;  Surgeon: Waynetta Sandy, MD;  Location: [place] [facility];  Service: Cardiovascular;  Laterality: Right;  Popliteal  . PERMANENT PACEMAKER INSERTION N/A 10/08/2014   Procedure: PERMANENT PACEMAKER INSERTION;  Surgeon: Deboraha Sprang, MD;  Location: [facility] CATH LAB;  Service: Cardiovascular;  Laterality: N/A;  . TEMPORARY PACEMAKER INSERTION Bilateral 10/07/2014   Procedure: TEMPORARY PACEMAKER INSERTION;  Surgeon: Laverda Page, MD;  Location: [facility] CATH LAB;  Service: Cardiovascular;  Laterality: Bilateral;    Social History   Socioeconomic History  . Marital status: Married    Spouse name: Not on file  . Number of children: 2  . Years of education: Not on file  . Highest education level: Not on file  Occupational History  . Occupation: Disabled  Social Needs  . Financial  resource strain: Not on file  . Food insecurity    Worry: Not on file    Inability: Not on file  . Transportation needs    Medical: Not on file    Non-medical: Not on file  Tobacco Use  . Smoking status: Former Smoker    Packs/day: 1.50    Years: 30.00    Pack years: 45.00    Types: Cigars    Quit date: 10/07/2014    Years since quitting: 4.7  . Smokeless tobacco: Never Used  Substance and Sexual Activity  . Alcohol use: Yes    Alcohol/week: 35.0 standard drinks    Types: 35 Cans of beer per week    Comment: 6 of 12 oz beers every other day  . Drug use: No  . Sexual activity: Not on  file  Lifestyle  . Physical activity    Days per week: Not on file    Minutes per session: Not on file  . Stress: Not on file  Relationships  . Social Herbalist on phone: Not on file    Gets together: Not on file    Attends religious service: Not on file    Active member of club or organization: Not on file    Attends meetings of clubs or organizations: Not on file    Relationship status: Not on file  . Intimate partner violence    Fear of current or ex partner: Not on file    Emotionally abused: Not on file    Physically abused: Not on file    Forced sexual activity: Not on file  Other Topics Concern  . Not on file  Social History Narrative  . Not on file    Review of Systems  Constitution: Negative for decreased appetite, malaise/fatigue, weight gain and weight loss.  Eyes: Negative for visual disturbance.  Cardiovascular: Negative for chest pain, claudication, dyspnea on exertion, leg swelling, orthopnea, palpitations and syncope.  Respiratory: Negative for hemoptysis and wheezing.   Endocrine: Negative for cold intolerance and heat intolerance.  Hematologic/Lymphatic: Does not bruise/bleed easily.  Skin: Negative for nail changes.  Musculoskeletal: Negative for muscle weakness and myalgias.  Gastrointestinal: Negative for abdominal pain, change in bowel habit, nausea and vomiting.  Neurological: Negative for difficulty with concentration, dizziness, focal weakness and headaches.  Psychiatric/Behavioral: Negative for altered mental status and suicidal ideas.  All other systems reviewed and are negative.     Objective:  There were no vitals taken for this visit. There is no height or weight on file to calculate BMI.    Physical exam not performed or limited due to virtual visit.    Please see exam details from prior visit is as below.   Physical Exam  Constitutional: He is oriented to person, place, and time. Vital signs are normal. He appears  well-developed and well-nourished.  HENT:  Head: Normocephalic and atraumatic.  Neck: Normal range of motion.  Cardiovascular: Normal rate, regular rhythm, normal heart sounds and intact distal pulses.  Pulses:      Femoral pulses are 1+ on the right side with bruit and 1+ on the left side with bruit.      Popliteal pulses are 0 on the right side and 0 on the left side.       Dorsalis pedis pulses are 0 on the right side and 0 on the left side.       Posterior tibial pulses are 0 on the right side and 0 on  the left side.  Paradoxical split S2  Bilateral ischemic changes: Left SIMS amputation, left fem-pop bypass, right 4&5 toe amputation and right fem pop bypass surgical scar noted.  Trace edema  Pulmonary/Chest: Effort normal and breath sounds normal. No accessory muscle usage. No respiratory distress.  Abdominal: Soft. Bowel sounds are normal.  Musculoskeletal: Normal range of motion.  Neurological: He is alert and oriented to person, place, and time.  Skin: Skin is warm and dry.  Vitals reviewed.  Radiology: No results found.  Laboratory examination:    CMP Latest Ref Rng & Units 09/16/2017 09/15/2017 09/14/2017  Glucose 65 - 99 mg/dL 240(H) 232(H) 254(H)  BUN 6 - 20 mg/dL 46(H) 59(H) 65(H)  Creatinine 0.61 - 1.24 mg/dL 1.68(H) 1.92(H) 2.28(H)  Sodium 135 - 145 mmol/L 137 136 133(L)  Potassium 3.5 - 5.1 mmol/L 4.3 4.6 4.7  Chloride 101 - 111 mmol/L 110 109 105  CO2 22 - 32 mmol/L 21(L) 20(L) 20(L)  Calcium 8.9 - 10.3 mg/dL 8.3(L) 8.3(L) 8.3(L)  Total Protein 6.5 - 8.1 g/dL - - -  Total Bilirubin 0.3 - 1.2 mg/dL - - -  Alkaline Phos 38 - 126 U/L - - -  AST 15 - 41 U/L - - -  ALT 17 - 63 U/L - - -   CBC Latest Ref Rng & Units 06/26/2019 05/29/2019 05/01/2019  WBC 4.0 - 10.5 K/uL - - -  Hemoglobin 13.0 - 17.0 g/dL 9.9(L) 10.4(L) 10.2(L)  Hematocrit 39.0 - 52.0 % - - -  Platelets 150 - 400 K/uL - - -   Lipid Panel     Component Value Date/Time   CHOL 147 11/20/2013 1422    TRIG 160 (H) 11/20/2013 1422   HDL 30 (L) 11/20/2013 1422   CHOLHDL 4.9 11/20/2013 1422   VLDL 32 11/20/2013 1422   LDLCALC 85 11/20/2013 1422   HEMOGLOBIN A1C Lab Results  Component Value Date   HGBA1C 6.5 (H) 09/10/2017   MPG 139.85 09/10/2017   TSH No results for input(s): TSH in the last 8760 hours.  PRN Meds:. There are no discontinued medications. No outpatient medications have been marked as taking for the 07/10/19 encounter (Appointment) with Miquel Dunn, NP.    Cardiac Studies:   Scheduled In Person Pacemaker Check 11/27/2018: Battery life >5 years. Normal pacemaker function, normal pacemaker thresholds. AP >55%. Brief AT/AF for 30 seconds, no VHR episodes. No further Sustained atrial fibrillation with controlled ventricular response 01/24/2017 for 12 hours.  Lexiscan myoview stress test 01/27/2016: 1. The resting electrocardiogram demonstrated normal sinus rhythm, normal resting conduction, no resting arrhythmias and normal rest repolarization. Stress EKG is non-diagnostic for ischemia as it a pharmacologic stress using Lexiscan. Stress symptoms included dyspnea. 2. The perfusion imaging study demonstrates a moderate-sized defect in the inferior wall extending from the base towards the apex, the defect slightly improved in stress images, suggests soft tissue attenuation. However scar in this region could not excluded. Left ventricular systolic function Calculated by QGS was 72%. This is a low risk study. Clinical correlation is recommended.  Echocardiogram 03/15/2016: Left ventricle cavity is normal in size. Severe concentric hypertrophy of the left ventricle. Normal global wall motion. Normal diastolic filling pattern. Calculated EF 66%. Left atrial cavity is mildly dilated. Right ventricle cavity is normal in size. Normal right ventricular function. Pacemaker lead/ICD lead noted in the RV. Trace mitral regurgitation. Trace pulmonic regurgitation.  Coronary  angiogram 11/09/2014: CSI diamont back atherectomy to Mid LAD and Distal dominant Cx and stenting  3.0x41mm and 2.5x53mm Promus DES. RCA long stenosis and small vessel (angio 10/07/14) left alone (medical management only). LVEF 60%.  SSS with tachy-brady syndrome S/P Medtronic advica DR (MRI compatible) dual-chamber pacemaker by Dr. Caryl Comes on 10/07/2014  Assessment:   No diagnosis found.  EKG 02/26/2018: A paced rhythm with first-degree block at rate of 72 bpm, left axis deviation, left anterior fascicular block. Right bundle branch block. No significant change from EKG 02/05/2017.  CHA2DS2-VASc Score is 3 with yearly risk of stroke of 3.2%. HAS-Bled score is and estimated major bleeding in one year is 1.88-3.2%  Recommendations:   Patient is here for 6-week follow-up for CAD, hypertension, and PAD.  He is presently doing well, blood pressure has improved with taking hydralazine on a regular basis.  We will continue the same.  On his last remote transmission in, had 2 episodes of nonsustained VT, will continue with beta-blocker therapy.  He has not had any further alerts on his pacemaker since.  He is on anticoagulation for paroxysmal atrial fibrillation, tolerating this well without any bleeding diathesis.  He has not had any symptoms of angina, on appropriate medical therapy.  Continue the same.  Continue to modify his risk factors.  He has cut back on his alcohol use since last seen by me 6 weeks ago, congratulated him on this and provided positive reinforcement.  I will see him back in 3 months or sooner if needed.  Miquel Dunn, MSN, APRN, FNP-C [facility]. [place] Office: [phone] Fax: [phone]

## 2019-07-24 ENCOUNTER — Ambulatory Visit (HOSPITAL_COMMUNITY)
Admission: RE | Admit: 2019-07-24 | Discharge: 2019-07-24 | Disposition: A | Discharge status: Home / Self Care | Payer: Medicare Other | Source: Ambulatory Visit | Attending: Nephrology | Admitting: Nephrology

## 2019-07-24 ENCOUNTER — Other Ambulatory Visit: Payer: Self-pay

## 2019-07-24 VITALS — BP 162/72 | HR 76 | Temp 97.2°F | Resp 18

## 2019-07-24 DIAGNOSIS — E1122 Type 2 diabetes mellitus with diabetic chronic kidney disease: Secondary | ICD-10-CM | POA: Insufficient documentation

## 2019-07-24 DIAGNOSIS — N183 Chronic kidney disease, stage 3 unspecified: Secondary | ICD-10-CM

## 2019-07-24 DIAGNOSIS — D631 Anemia in chronic kidney disease: Secondary | ICD-10-CM | POA: Diagnosis not present

## 2019-07-24 LAB — IRON AND TIBC
Iron: 65 ug/dL (ref 45–182)
Saturation Ratios: 27 % (ref 17.9–39.5)
TIBC: 242 ug/dL — ABNORMAL LOW (ref 250–450)
UIBC: 177 ug/dL

## 2019-07-24 LAB — POCT HEMOGLOBIN-HEMACUE: Hemoglobin: 9.7 g/dL — ABNORMAL LOW (ref 13.0–17.0)

## 2019-07-24 LAB — FERRITIN: Ferritin: 166 ng/mL (ref 24–336)

## 2019-07-24 MED ORDER — DARBEPOETIN ALFA 150 MCG/0.3ML IJ SOSY
PREFILLED_SYRINGE | INTRAMUSCULAR | Status: AC
  Filled 2019-07-24: qty 0.3

## 2019-07-24 MED ORDER — DARBEPOETIN ALFA 150 MCG/0.3ML IJ SOSY
150.0000 ug | PREFILLED_SYRINGE | INTRAMUSCULAR | Status: DC
  Administered 2019-07-24: 150 ug via SUBCUTANEOUS

## 2019-07-28 ENCOUNTER — Other Ambulatory Visit: Payer: Self-pay | Admitting: Cardiology

## 2019-07-28 DIAGNOSIS — I495 Sick sinus syndrome: Secondary | ICD-10-CM

## 2019-08-21 ENCOUNTER — Other Ambulatory Visit: Payer: Self-pay

## 2019-08-21 ENCOUNTER — Ambulatory Visit (HOSPITAL_COMMUNITY)
Admission: RE | Admit: 2019-08-21 | Discharge: 2019-08-21 | Disposition: A | Discharge status: Home / Self Care | Payer: Medicare Other | Source: Ambulatory Visit | Attending: Nephrology | Admitting: Nephrology

## 2019-08-21 VITALS — BP 119/57 | HR 67 | Temp 98.1°F | Resp 18

## 2019-08-21 DIAGNOSIS — E1122 Type 2 diabetes mellitus with diabetic chronic kidney disease: Secondary | ICD-10-CM | POA: Diagnosis not present

## 2019-08-21 DIAGNOSIS — D631 Anemia in chronic kidney disease: Secondary | ICD-10-CM

## 2019-08-21 DIAGNOSIS — N183 Chronic kidney disease, stage 3 unspecified: Secondary | ICD-10-CM | POA: Diagnosis not present

## 2019-08-21 LAB — POCT HEMOGLOBIN-HEMACUE: Hemoglobin: 9.3 g/dL — ABNORMAL LOW (ref 13.0–17.0)

## 2019-08-21 LAB — IRON AND TIBC
Iron: 81 ug/dL (ref 45–182)
Saturation Ratios: 34 % (ref 17.9–39.5)
TIBC: 241 ug/dL — ABNORMAL LOW (ref 250–450)
UIBC: 160 ug/dL

## 2019-08-21 LAB — FERRITIN: Ferritin: 157 ng/mL (ref 24–336)

## 2019-08-21 MED ORDER — DARBEPOETIN ALFA 150 MCG/0.3ML IJ SOSY
150.0000 ug | PREFILLED_SYRINGE | INTRAMUSCULAR | Status: DC
  Administered 2019-08-21: 09:00:00 150 ug via SUBCUTANEOUS

## 2019-08-21 MED ORDER — DARBEPOETIN ALFA 150 MCG/0.3ML IJ SOSY
PREFILLED_SYRINGE | INTRAMUSCULAR | Status: AC
  Administered 2019-08-21: 150 ug via SUBCUTANEOUS
  Filled 2019-08-21: qty 0.3

## 2019-08-27 ENCOUNTER — Other Ambulatory Visit: Payer: Self-pay | Admitting: Cardiology

## 2019-09-04 ENCOUNTER — Telehealth: Payer: Self-pay

## 2019-09-04 NOTE — Telephone Encounter (Signed)
We can continue remote for now

## 2019-09-04 NOTE — Telephone Encounter (Signed)
Pt called he does not feel comfortable coming to in office ICD/Pacer check on 10/28 so we canceled his appt and he will call when he is comfortable coming in

## 2019-09-09 ENCOUNTER — Ambulatory Visit: Payer: Medicare Other | Admitting: Cardiology

## 2019-09-18 ENCOUNTER — Ambulatory Visit (HOSPITAL_COMMUNITY)
Admission: RE | Admit: 2019-09-18 | Discharge: 2019-09-18 | Disposition: A | Discharge status: Home / Self Care | Payer: Medicare Other | Source: Ambulatory Visit | Attending: Nephrology | Admitting: Nephrology

## 2019-09-18 ENCOUNTER — Other Ambulatory Visit: Payer: Self-pay

## 2019-09-18 VITALS — BP 160/66 | HR 71 | Temp 97.5°F | Resp 18

## 2019-09-18 DIAGNOSIS — N183 Chronic kidney disease, stage 3 unspecified: Secondary | ICD-10-CM

## 2019-09-18 DIAGNOSIS — E1122 Type 2 diabetes mellitus with diabetic chronic kidney disease: Secondary | ICD-10-CM | POA: Diagnosis not present

## 2019-09-18 LAB — IRON AND TIBC
Iron: 80 ug/dL (ref 45–182)
Saturation Ratios: 31 % (ref 17.9–39.5)
TIBC: 255 ug/dL (ref 250–450)
UIBC: 175 ug/dL

## 2019-09-18 LAB — POCT HEMOGLOBIN-HEMACUE: Hemoglobin: 9.6 g/dL — ABNORMAL LOW (ref 13.0–17.0)

## 2019-09-18 LAB — FERRITIN: Ferritin: 178 ng/mL (ref 24–336)

## 2019-09-18 MED ORDER — DARBEPOETIN ALFA 200 MCG/0.4ML IJ SOSY
PREFILLED_SYRINGE | INTRAMUSCULAR | Status: AC
  Filled 2019-09-18: qty 0.4

## 2019-09-18 MED ORDER — DARBEPOETIN ALFA 200 MCG/0.4ML IJ SOSY
200.0000 ug | PREFILLED_SYRINGE | INTRAMUSCULAR | Status: DC
  Administered 2019-09-18: 200 ug via SUBCUTANEOUS

## 2019-09-22 ENCOUNTER — Telehealth: Payer: Self-pay

## 2019-09-22 NOTE — Telephone Encounter (Signed)
Pt aware of transmission  

## 2019-09-22 NOTE — Telephone Encounter (Signed)
-----   Message from Adrian Prows, MD sent at 09/20/2019  9:58 AM EST ----- Regarding: Pacemaker Scheduled Remote pacemaker check 09/18/2019: DDD PM. No mode switch. No VHR episodes. Health trends do not demonstrate significant abnormality. Battery longevity is 4.5-7.5 years. RA pacing is 58.4 %, RV pacing is <0.1 %.  Normal function.

## 2019-09-25 ENCOUNTER — Other Ambulatory Visit: Payer: Self-pay | Admitting: Cardiology

## 2019-10-06 DIAGNOSIS — Z125 Encounter for screening for malignant neoplasm of prostate: Secondary | ICD-10-CM | POA: Diagnosis not present

## 2019-10-06 DIAGNOSIS — E7800 Pure hypercholesterolemia, unspecified: Secondary | ICD-10-CM | POA: Diagnosis not present

## 2019-10-06 DIAGNOSIS — E1151 Type 2 diabetes mellitus with diabetic peripheral angiopathy without gangrene: Secondary | ICD-10-CM | POA: Diagnosis not present

## 2019-10-13 DIAGNOSIS — I25810 Atherosclerosis of coronary artery bypass graft(s) without angina pectoris: Secondary | ICD-10-CM | POA: Diagnosis not present

## 2019-10-13 DIAGNOSIS — Z0000 Encounter for general adult medical examination without abnormal findings: Secondary | ICD-10-CM | POA: Diagnosis not present

## 2019-10-13 DIAGNOSIS — Z7901 Long term (current) use of anticoagulants: Secondary | ICD-10-CM | POA: Diagnosis not present

## 2019-10-13 DIAGNOSIS — I4821 Permanent atrial fibrillation: Secondary | ICD-10-CM | POA: Diagnosis not present

## 2019-10-14 ENCOUNTER — Encounter: Payer: Self-pay | Admitting: Cardiology

## 2019-10-16 ENCOUNTER — Other Ambulatory Visit: Payer: Self-pay

## 2019-10-16 ENCOUNTER — Ambulatory Visit (HOSPITAL_COMMUNITY)
Admission: RE | Admit: 2019-10-16 | Discharge: 2019-10-16 | Disposition: A | Discharge status: Home / Self Care | Payer: Medicare Other | Source: Ambulatory Visit | Attending: Nephrology | Admitting: Nephrology

## 2019-10-16 VITALS — BP 135/60 | HR 64 | Temp 97.2°F | Resp 18

## 2019-10-16 DIAGNOSIS — N183 Chronic kidney disease, stage 3 unspecified: Secondary | ICD-10-CM | POA: Insufficient documentation

## 2019-10-16 DIAGNOSIS — E1122 Type 2 diabetes mellitus with diabetic chronic kidney disease: Secondary | ICD-10-CM | POA: Insufficient documentation

## 2019-10-16 LAB — IRON AND TIBC
Iron: 83 ug/dL (ref 45–182)
Saturation Ratios: 34 % (ref 17.9–39.5)
TIBC: 244 ug/dL — ABNORMAL LOW (ref 250–450)
UIBC: 161 ug/dL

## 2019-10-16 LAB — FERRITIN: Ferritin: 186 ng/mL (ref 24–336)

## 2019-10-16 LAB — POCT HEMOGLOBIN-HEMACUE: Hemoglobin: 9.3 g/dL — ABNORMAL LOW (ref 13.0–17.0)

## 2019-10-16 MED ORDER — DARBEPOETIN ALFA 200 MCG/0.4ML IJ SOSY
200.0000 ug | PREFILLED_SYRINGE | INTRAMUSCULAR | Status: DC
  Administered 2019-10-16: 200 ug via SUBCUTANEOUS

## 2019-10-16 MED ORDER — DARBEPOETIN ALFA 200 MCG/0.4ML IJ SOSY
PREFILLED_SYRINGE | INTRAMUSCULAR | Status: AC
  Filled 2019-10-16: qty 0.4

## 2019-10-23 ENCOUNTER — Other Ambulatory Visit: Payer: Self-pay

## 2019-10-23 ENCOUNTER — Ambulatory Visit (INDEPENDENT_AMBULATORY_CARE_PROVIDER_SITE_OTHER): Payer: Medicare Other | Admitting: Cardiology

## 2019-10-23 VITALS — BP 123/60 | HR 88 | Temp 97.8°F | Resp 15 | Ht 72.0 in | Wt 269.0 lb

## 2019-10-23 DIAGNOSIS — I129 Hypertensive chronic kidney disease with stage 1 through stage 4 chronic kidney disease, or unspecified chronic kidney disease: Secondary | ICD-10-CM | POA: Diagnosis not present

## 2019-10-23 DIAGNOSIS — I480 Paroxysmal atrial fibrillation: Secondary | ICD-10-CM | POA: Diagnosis not present

## 2019-10-23 DIAGNOSIS — I1310 Hypertensive heart and chronic kidney disease without heart failure, with stage 1 through stage 4 chronic kidney disease, or unspecified chronic kidney disease: Secondary | ICD-10-CM | POA: Diagnosis not present

## 2019-10-23 DIAGNOSIS — I10 Essential (primary) hypertension: Secondary | ICD-10-CM

## 2019-10-23 DIAGNOSIS — I2510 Atherosclerotic heart disease of native coronary artery without angina pectoris: Secondary | ICD-10-CM | POA: Diagnosis not present

## 2019-10-23 DIAGNOSIS — Z950 Presence of cardiac pacemaker: Secondary | ICD-10-CM | POA: Diagnosis not present

## 2019-10-23 DIAGNOSIS — R82998 Other abnormal findings in urine: Secondary | ICD-10-CM | POA: Diagnosis not present

## 2019-10-23 DIAGNOSIS — N184 Chronic kidney disease, stage 4 (severe): Secondary | ICD-10-CM

## 2019-10-23 NOTE — Progress Notes (Signed)
Primary Physician:  [name], MD   Patient ID: Alexander [name], male    DOB: Dec 20, 1960, 58 y.o.   MRN: [id number]  Subjective:    Chief Complaint  Patient presents with  . Hypertension  . Results    Labs  . Follow-up   HPI: Alexander [name]  is a 58 y.o. male  with paroxysmal atrial fibrillation noted on pacemaker transmission, complete heart block leading to Medtronic pacemaker implantation on 10/07/2014, coronary artery disease with angioplasty and stenting to the LAD and dominant circumflex in December 2015, diffusely diseased small RCA, uncontrolled diabetes mellitus with peripheral arterial disease being followed by vascular and is had left metatarsal amputation in 2014. He was admitted to the hospital on 09/16/2017 with right foot ulceration, he underwent thrombectomy followed by redo right below-knee popliteal artery bypass surgery by Dr. Doren Custard.   Patient is here for follow-up for CAD and PAF. He has not had any recent episodes of A fib by pacemaker transmissions. He did have 21 beats of NSVT in August; however, no recurrence on recent transmission. Denies any chest pain. Has chronic dyspnea on exertion that has remained unchanged.   He has been monitored regularly by Dr. Doren Custard, previously had right lateral foot ulceration that is now healed. He has seen Dr. Doren Custard in March and graft duplex was widely patent. ABI on right was 1.0 and left ABI was 0.86. He is to follow up in 1 year. No symptoms of claudication. He does have some cramping in his legs at night.  He reports that diabetes has been well-controlled, last hemoglobin A1c 6.5%.  He also continues to be followed by Dr. Edrick Oh for chronic renal failure and iron deficiency anemia requiring monthly injections. He has recently had worsening kidney function.   He has remained abstinent from tobacco use. Does admit to drinking 1 beer daily.    Past Medical History:  Diagnosis Date  . Alcohol abuse   . Anemia   .  Arthritis   . Atrial fibrillation ([place])   . Cardiac syncope 10/07/14   rapid A fib with 8 sec pauses on converison with syncope- temp pacing wire placed then PPM  . Cataract    BILATERAL  . Chronic kidney disease   . Coronary artery disease   . Diabetes mellitus    dx---been  awhile  . Encounter for care of pacemaker 10/08/2014  . Hyperlipemia   . Hypertension   . Peripheral vascular disease ([place])   . Presence of permanent cardiac pacemaker 10/08/2014  . Sinus node dysfunction ([place]) 10/08/2014    Past Surgical History:  Procedure Laterality Date  . ABDOMINAL AORTOGRAM W/LOWER EXTREMITY N/A 09/09/2017   Procedure: ABDOMINAL AORTOGRAM W/LOWER EXTREMITY;  Surgeon: Angelia Mould, MD;  Location: [place] [facility];  Service: Cardiovascular;  Laterality: N/A;  . AMPUTATION Left 08/19/2013   Procedure: AMPUTATION RAY;  Surgeon: Alta Corning, MD;  Location: [place];  Service: Orthopedics;  Laterality: Left;  ray amputation left 5th  . AMPUTATION Left 10/27/2013   Procedure: AMPUTATION DIGIT-LEFT 4TH TOE, 4th and 5th metatarsal.;  Surgeon: Angelia Mould, MD;  Location: [place];  Service: Vascular;  Laterality: Left;  . AMPUTATION Left 11/04/2013   Procedure: LEFT FOOT TRANS-METATARSAL AMPUTATION WITH WOUND CLOSURE ;  Surgeon: Alta Corning, MD;  Location: [place];  Service: Orthopedics;  Laterality: Left;  . AMPUTATION Right 09/10/2017   Procedure: RIGHT FOURTH AND FIFTH TOE AMPUTATION;  Surgeon: Angelia Mould, MD;  Location: [facility];  Service: Vascular;  Laterality: Right;  . CARDIAC CATHETERIZATION  10/07/2014   Procedure: LEFT HEART CATH AND CORONARY ANGIOGRAPHY;  Surgeon: Laverda Page, MD;  Location: [facility] C[facility] LAB;  Service: Cardiovascular;;  . EMBOLECTOMY Right 09/12/2017   Procedure: Thrombectomy  & Redo Right Below Knee Popliteal Artery Bypass Graft  ;  Surgeon: Waynetta Sandy, MD;  Location: [place];  Service: Vascular;  Laterality: Right;  .  FEMORAL-TIBIAL BYPASS GRAFT Left 10/27/2013   Procedure: BYPASS GRAFT LEFT FEMORAL- POSTERIOR TIBIAL ARTERY;  Surgeon: Angelia Mould, MD;  Location: [place];  Service: Vascular;  Laterality: Left;  . FEMORAL-TIBIAL BYPASS GRAFT Right 09/10/2017   Procedure: RIGHT SUPERFICIAL  FEMORAL ARTERY-BELOW KNEE POPLITEAL ARTERY BYPASS GRAFT WITH VEIN;  Surgeon: Angelia Mould, MD;  Location: [place];  Service: Vascular;  Laterality: Right;  . I&D EXTREMITY Left 10/27/2013   Procedure: IRRIGATION AND DEBRIDEMENT EXTREMITY- LEFT FOOT;  Surgeon: Angelia Mould, MD;  Location: [place];  Service: Vascular;  Laterality: Left;  . INTRAOPERATIVE ARTERIOGRAM Right 09/10/2017   Procedure: INTRA OPERATIVE ARTERIOGRAM;  Surgeon: Angelia Mould, MD;  Location: [place];  Service: Vascular;  Laterality: Right;  . IR ANGIOGRAM FOLLOW UP STUDY  09/09/2017  . LEFT HEART CATHETERIZATION WITH CORONARY ANGIOGRAM N/A 11/09/2014   Procedure: LEFT HEART CATHETERIZATION WITH CORONARY ANGIOGRAM;  Surgeon: Laverda Page, MD;  Location: [facility] C[facility] LAB;  Service: Cardiovascular;  Laterality: N/A;  . LOWER EXTREMITY ANGIOGRAM Right 11/08/2015   Procedure: Lower Extremity Angiogram;  Surgeon: Serafina Mitchell, MD;  Location: [place] [facility];  Service: Cardiovascular;  Laterality: Right;  . LUMBAR LAMINECTOMY  06/13/2012   Procedure: MICRODISCECTOMY LUMBAR LAMINECTOMY;  Surgeon: Jessy Oto, MD;  Location: [place];  Service: Orthopedics;  Laterality: N/A;  Central laminectomy L2-3, L3-4, L4-5  . PACEMAKER INSERTION  10/08/14   MDT Advisa MRI compatible dual chamber pacemaker implanted by Dr Caryl Comes for syncope with post-termination pauses  . PERCUTANEOUS CORONARY STENT INTERVENTION (PCI-S)  11/09/2014   des to lad & distal circumflex         Dr  [name]  . PERIPHERAL VASCULAR CATHETERIZATION N/A 11/08/2015   Procedure: Abdominal Aortogram;  Surgeon: Serafina Mitchell, MD;  Location: [place] [facility];  Service:  Cardiovascular;  Laterality: N/A;  . PERIPHERAL VASCULAR CATHETERIZATION Right 11/08/2015   Procedure: Peripheral Vascular Atherectomy;  Surgeon: Serafina Mitchell, MD;  Location: [place] [facility];  Service: Cardiovascular;  Laterality: Right;  . PERIPHERAL VASCULAR CATHETERIZATION N/A 10/10/2016   Procedure: Lower Extremity Angiography;  Surgeon: Waynetta Sandy, MD;  Location: [place] [facility];  Service: Cardiovascular;  Laterality: N/A;  . PERIPHERAL VASCULAR CATHETERIZATION Right 10/10/2016   Procedure: Peripheral Vascular Atherectomy;  Surgeon: Waynetta Sandy, MD;  Location: [place] [facility];  Service: Cardiovascular;  Laterality: Right;  Popliteal  . PERMANENT PACEMAKER INSERTION N/A 10/08/2014   Procedure: PERMANENT PACEMAKER INSERTION;  Surgeon: Deboraha Sprang, MD;  Location: [facility] CATH LAB;  Service: Cardiovascular;  Laterality: N/A;  . TEMPORARY PACEMAKER INSERTION Bilateral 10/07/2014   Procedure: TEMPORARY PACEMAKER INSERTION;  Surgeon: Laverda Page, MD;  Location: [facility] CATH LAB;  Service: Cardiovascular;  Laterality: Bilateral;    Social History   Socioeconomic History  . Marital status: Married    Spouse name: Not on file  . Number of children: 2  . Years of education: Not on file  . Highest education level: Not on file  Occupational  History  . Occupation: Disabled  Tobacco Use  . Smoking status: Former Smoker    Packs/day: 1.50    Years: 30.00    Pack years: 45.00    Types: Cigars    Quit date: 10/07/2014    Years since quitting: 5.0  . Smokeless tobacco: Never Used  Substance and Sexual Activity  . Alcohol use: Yes    Alcohol/week: 35.0 standard drinks    Types: 35 Cans of beer per week    Comment: 6 of 12 oz beers every other day  . Drug use: No  . Sexual activity: Not on file  Other Topics Concern  . Not on file  Social History Narrative  . Not on file   Social Determinants of Health   Financial Resource Strain:   . Difficulty of  Paying Living Expenses: Not on file  Food Insecurity:   . Worried About Charity fundraiser in the Last Year: Not on file  . Ran Out of Food in the Last Year: Not on file  Transportation Needs:   . Lack of Transportation (Medical): Not on file  . Lack of Transportation (Non-Medical): Not on file  Physical Activity:   . Days of Exercise per Week: Not on file  . Minutes of Exercise per Session: Not on file  Stress:   . Feeling of Stress : Not on file  Social Connections:   . Frequency of Communication with Friends and Family: Not on file  . Frequency of Social Gatherings with Friends and Family: Not on file  . Attends Religious Services: Not on file  . Active Member of Clubs or Organizations: Not on file  . Attends Archivist Meetings: Not on file  . Marital Status: Not on file  Intimate Partner Violence:   . Fear of Current or Ex-Partner: Not on file  . Emotionally Abused: Not on file  . Physically Abused: Not on file  . Sexually Abused: Not on file    Review of Systems  Constitution: Negative for decreased appetite, malaise/fatigue, weight gain and weight loss.  Eyes: Negative for visual disturbance.  Cardiovascular: Negative for chest pain, claudication, dyspnea on exertion, leg swelling, orthopnea, palpitations and syncope.  Respiratory: Negative for hemoptysis and wheezing.   Endocrine: Negative for cold intolerance and heat intolerance.  Hematologic/Lymphatic: Does not bruise/bleed easily.  Skin: Negative for nail changes.  Musculoskeletal: Negative for muscle weakness and myalgias.  Gastrointestinal: Negative for abdominal pain, change in bowel habit, nausea and vomiting.  Neurological: Negative for difficulty with concentration, dizziness, focal weakness and headaches.  Psychiatric/Behavioral: Negative for altered mental status and suicidal ideas.  All other systems reviewed and are negative.     Objective:   Vitals with BMI 10/23/2019 10/23/2019 10/16/2019    Height '6\' 0"'  '6\' 0"'  -  Weight 269 lbs 269 lbs 3 oz -  BMI 69.67 89.3 -  Systolic 810 - 175  Diastolic 60 - 60  Pulse 88 - 64    Physical Exam  Constitutional: He is oriented to person, place, and time. Vital signs are normal. He appears well-developed and well-nourished.  HENT:  Head: Normocephalic and atraumatic.  Cardiovascular: Normal rate, regular rhythm, normal heart sounds and intact distal pulses.  Pulses:      Femoral pulses are 1+ on the right side with bruit and 1+ on the left side with bruit.      Popliteal pulses are 0 on the right side and 0 on the left side.  Dorsalis pedis pulses are 0 on the right side and 0 on the left side.       Posterior tibial pulses are 0 on the right side and 0 on the left side.  Paradoxical split S2  Bilateral ischemic changes: Left SIMS amputation, left fem-pop bypass, right 4&5 toe amputation and right fem pop bypass surgical scar noted.  Trace edema  Pulmonary/Chest: Effort normal and breath sounds normal. No accessory muscle usage. No respiratory distress.  Abdominal: Soft. Bowel sounds are normal.  Musculoskeletal:        General: Normal range of motion.     Cervical back: Normal range of motion.  Neurological: He is alert and oriented to person, place, and time.  Skin: Skin is warm and dry.  Vitals reviewed.  Radiology: No results found.  Laboratory examination:   Labs 10/06/2019: A1c 6.5%.  Serum glucose 136 mg, BUN 55, creatinine 2.6, eGFR 25/30 ML.  Potassium 5.4.  CMP otherwise normal.  HB 10.2/HCT 31.4, platelets 141.  Normal indicis.  Total cholesterol 95, triglycerides 98, HDL 34, LDL 41.  Non-HDL cholesterol 61.  PSA normal.  10/03/2018: Glucose 214, creatinine 2.1, EGFR 32/39, potassium 4.9, CMP otherwise normal.  RBC 3.6, hemoglobin 11.0, hematocrit 33.8, CBC otherwise normal.  Cholesterol 122, triglycerides 180, HDL 35, LDL 51.  CMP Latest Ref Rng & Units 09/16/2017 09/15/2017 09/14/2017  Glucose 65 - 99 mg/dL  240(H) 232(H) 254(H)  BUN 6 - 20 mg/dL 46(H) 59(H) 65(H)  Creatinine 0.61 - 1.24 mg/dL 1.68(H) 1.92(H) 2.28(H)  Sodium 135 - 145 mmol/L 137 136 133(L)  Potassium 3.5 - 5.1 mmol/L 4.3 4.6 4.7  Chloride 101 - 111 mmol/L 110 109 105  CO2 22 - 32 mmol/L 21(L) 20(L) 20(L)  Calcium 8.9 - 10.3 mg/dL 8.3(L) 8.3(L) 8.3(L)  Total Protein 6.5 - 8.1 g/dL - - -  Total Bilirubin 0.3 - 1.2 mg/dL - - -  Alkaline Phos 38 - 126 U/L - - -  AST 15 - 41 U/L - - -  ALT 17 - 63 U/L - - -   CBC Latest Ref Rng & Units 10/16/2019 09/18/2019 08/21/2019  WBC 4.0 - 10.5 K/uL - - -  Hemoglobin 13.0 - 17.0 g/dL 9.3(L) 9.6(L) 9.3(L)  Hematocrit 39.0 - 52.0 % - - -  Platelets 150 - 400 K/uL - - -   Lipid Panel     Component Value Date/Time   CHOL 147 11/20/2013 1422   TRIG 160 (H) 11/20/2013 1422   HDL 30 (L) 11/20/2013 1422   CHOLHDL 4.9 11/20/2013 1422   VLDL 32 11/20/2013 1422   LDLCALC 85 11/20/2013 1422   HEMOGLOBIN A1C Lab Results  Component Value Date   HGBA1C 6.5 (H) 09/10/2017   MPG 139.85 09/10/2017   TSH No results for input(s): TSH in the last 8760 hours.  PRN Meds:. Medications Discontinued During This Encounter  Medication Reason  . chlorthalidone (HYGROTON) 50 MG tablet Error  . furosemide (LASIX) 40 MG tablet Error  . amLODipine (NORVASC) 10 MG tablet Error   Current Meds  Medication Sig  . ACCU-CHEK FASTCLIX LANCETS MISC Check blood sugar TID & QHS  . apixaban (ELIQUIS) 5 MG TABS tablet Take 1 tablet (5 mg total) by mouth 2 (two) times daily.  Marland Kitchen aspirin 81 MG chewable tablet Chew 81 mg by mouth daily.   Marland Kitchen atorvastatin (LIPITOR) 20 MG tablet Take 1 tablet by mouth once daily  . blood glucose meter kit and supplies KIT Check blood sugar TID &  QHS  . Blood Glucose Monitoring Suppl (ACCU-CHEK ADVANTAGE DIABETES) kit Use as instructed  . furosemide (LASIX) 40 MG tablet Take 40 mg by mouth 2 (two) times daily.  Marland Kitchen glucose blood (ACCU-CHEK AVIVA PLUS) test strip Use as instructed  .  glucose blood (CHOICE DM FORA G20 TEST STRIPS) test strip Check blood sugar TID & QHS  . glucose blood test strip Use as instructed  . hydrALAZINE (APRESOLINE) 100 MG tablet TAKE 1 TABLET BY MOUTH THREE TIMES DAILY  . Insulin NPH Isophane & Regular (NOVOLIN 70/30 [place]) Inject 46-56 Units into the skin See admin instructions. Inject 56 units SQ in the morning and inject 48 units SQ in the evening  . lisinopril (ZESTRIL) 10 MG tablet Take 1 tablet by mouth once daily  . metoprolol tartrate (LOPRESSOR) 100 MG tablet Take 1 tablet by mouth twice daily  . Multiple Vitamin (MULTIVITAMIN WITH MINERALS) TABS tablet Take 1 tablet by mouth daily.  . prazosin (MINIPRESS) 2 MG capsule TAKE 1 CAPSULE BY MOUTH IN THE EVENING AFTER SUPPER  . [DISCONTINUED] furosemide (LASIX) 40 MG tablet     Cardiac Studies:   Lexiscan myoview stress test 01/27/2016: 1. The resting electrocardiogram demonstrated normal sinus rhythm, normal resting conduction, no resting arrhythmias and normal rest repolarization. Stress EKG is non-diagnostic for ischemia as it a pharmacologic stress using Lexiscan. Stress symptoms included dyspnea. 2. The perfusion imaging study demonstrates a moderate-sized defect in the inferior wall extending from the base towards the apex, the defect slightly improved in stress images, suggests soft tissue attenuation. However scar in this region could not excluded. Left ventricular systolic function Calculated by QGS was 72%. This is a low risk study. Clinical correlation is recommended.  Echocardiogram 03/15/2016: Left ventricle cavity is normal in size. Severe concentric hypertrophy of the left ventricle. Normal global wall motion. Normal diastolic filling pattern. Calculated EF 66%. Left atrial cavity is mildly dilated. Right ventricle cavity is normal in size. Normal right ventricular function. Pacemaker lead/ICD lead noted in the RV. Trace mitral regurgitation. Trace pulmonic  regurgitation.  Coronary angiogram 11/09/2014: CSI diamont back atherectomy to Mid LAD and Distal dominant Cx and stenting 3.0x44m and 2.5x174mPromus DES. RCA long stenosis and small vessel (angio 10/07/14) left alone (medical management only). LVEF 60%.  SSS with tachy-brady syndrome S/P Medtronic advica DR (MRI compatible) dual-chamber pacemaker by Dr. KlCaryl Comesn 10/07/2014  Assessment:     ICD-10-CM   1. Atherosclerosis of native coronary artery of native heart without angina pectoris  I25.10   2. Essential hypertension, benign  I10 EKG 12-Lead  3. Cardiac pacemaker in situ  Z95.0   4. Paroxysmal atrial fibrillation (HCC)  I48.0   5. CKD (chronic kidney disease) stage 4, GFR 15-29 ml/min (HCC)  N18.4 PCV RENAL/RENAL ARTERY DUPLEX COMPLETE   EKG 10/23/2019: Sinus rhythm at 82 bpm with first degree AV block, left axis deviation, left anterior fascicular block. Right bundle branch block.   EKG 02/26/2018: A paced rhythm with first-degree block at rate of 72 bpm, left axis deviation, left anterior fascicular block. Right bundle branch block. No significant change from EKG 02/05/2017.  CHA2DS2-VASc Score is 3 with yearly risk of stroke of 3.2%. HAS-Bled score is and estimated major bleeding in one year is 1.88-3.2%  Remote pacemaker check (Unscheduled (Alert) ) 8.5.20: One VHR episode, EKG shows 21 beat NSVT. Health trends do not demonstrate significant abnormality. Battery longevity is 4.5-7.0 years . RA pacing is 55.8 %, RV pacing is <0.1 %.  Scheduled Remote pacemaker check 09/18/2019: DDD PM. No mode switch. No VHR episodes. Health trends do not demonstrate significant abnormality. Battery longevity is 4.5-7.5 years. RA pacing is 58.4 %, RV pacing is <0.1 %.  Scheduled In Person Pacemaker Check 11/27/2018: Battery life >5 years. Normal pacemaker function, normal pacemaker thresholds. AP >55%. Brief AT/AF for 30 seconds, no VHR episodes. No further Sustained atrial fibrillation with  controlled ventricular response 01/24/2017 for 12 hours.   Recommendations:   Patient is here for 53-monthoffice visit.  He is doing well without any complaints today.  No symptoms of angina or clinical evidence of heart failure.  His blood pressure is well controlled with current medications.  He has a history of difficult to control hypertension.  I reviewed recent labs from PCP office, he has had progressively worsening kidney function.  He is being followed by nephrology for management of CKD and also his anemia.  He does not appear to have had renal artery duplex, will obtain this for evaluation especially given his history of PAD.  He continues to follow Dr. DDoren Custardon an annual basis, no symptoms of claudication or changes to his vascular exam.  His lipids are well controlled.  He previously had unscheduled alert on remote transmission showing 21 beat NSVT; however, on recent transmission this was not noted.  Has normal pacemaker function.  We will continue with remote monitoring.  He will be due for an office pacemaker check in the next few months which will be arranged.  No recent episodes of atrial fibrillation, continue with anticoagulation.  No bleeding diathesis reported.  I will plan to see him back in 6 months, but encouraged him to contact me sooner if needed.  I will notify him of renal artery duplex results.  Alexander Dunn MSN, APRN, FNP-C [facility]. PDeer LickOffice: [phone]: [phone]

## 2019-10-27 ENCOUNTER — Other Ambulatory Visit: Payer: Self-pay | Admitting: Cardiology

## 2019-10-27 DIAGNOSIS — I495 Sick sinus syndrome: Secondary | ICD-10-CM

## 2019-10-27 DIAGNOSIS — Z1212 Encounter for screening for malignant neoplasm of rectum: Secondary | ICD-10-CM | POA: Diagnosis not present

## 2019-11-02 DIAGNOSIS — L03115 Cellulitis of right lower limb: Secondary | ICD-10-CM | POA: Diagnosis not present

## 2019-11-02 DIAGNOSIS — I739 Peripheral vascular disease, unspecified: Secondary | ICD-10-CM | POA: Diagnosis not present

## 2019-11-02 DIAGNOSIS — L97419 Non-pressure chronic ulcer of right heel and midfoot with unspecified severity: Secondary | ICD-10-CM | POA: Diagnosis not present

## 2019-11-02 DIAGNOSIS — E11621 Type 2 diabetes mellitus with foot ulcer: Secondary | ICD-10-CM | POA: Diagnosis not present

## 2019-11-04 ENCOUNTER — Telehealth (HOSPITAL_COMMUNITY): Payer: Self-pay

## 2019-11-04 ENCOUNTER — Other Ambulatory Visit: Payer: Self-pay

## 2019-11-04 DIAGNOSIS — R600 Localized edema: Secondary | ICD-10-CM

## 2019-11-04 NOTE — Telephone Encounter (Signed)

## 2019-11-05 ENCOUNTER — Other Ambulatory Visit: Payer: Self-pay

## 2019-11-05 ENCOUNTER — Ambulatory Visit (HOSPITAL_COMMUNITY)
Admission: RE | Admit: 2019-11-05 | Discharge: 2019-11-05 | Disposition: A | Discharge status: Home / Self Care | Payer: Medicare Other | Source: Ambulatory Visit | Attending: Vascular Surgery | Admitting: Vascular Surgery

## 2019-11-05 ENCOUNTER — Ambulatory Visit (INDEPENDENT_AMBULATORY_CARE_PROVIDER_SITE_OTHER): Payer: Medicare Other | Admitting: Physician Assistant

## 2019-11-05 VITALS — BP 155/70 | HR 69 | Temp 98.0°F | Resp 16 | Ht 72.0 in | Wt 269.0 lb

## 2019-11-05 DIAGNOSIS — L97411 Non-pressure chronic ulcer of right heel and midfoot limited to breakdown of skin: Secondary | ICD-10-CM

## 2019-11-05 DIAGNOSIS — I739 Peripheral vascular disease, unspecified: Secondary | ICD-10-CM

## 2019-11-05 DIAGNOSIS — L97409 Non-pressure chronic ulcer of unspecified heel and midfoot with unspecified severity: Secondary | ICD-10-CM | POA: Insufficient documentation

## 2019-11-05 DIAGNOSIS — R600 Localized edema: Secondary | ICD-10-CM | POA: Diagnosis not present

## 2019-11-05 NOTE — Progress Notes (Signed)
Established Critical Limb Ischemia Patient   History of Present Illness   Alexander [name] is a 58 y.o. (06/14/1961) male who presents with new painful ulceration of right medial heel.   Surgical history significant for left common femoral to TP trunk bypass with vein graft and right amputation of left fourth and fifth toe which subsequently required left TMA in December 2014.  In October 2018 he had a right SFA to below the knee popliteal artery bypass with vein graft and ray amputation of right fourth and fifth toes.  Patient states current heel ulceration started about a week ago and patient also had concurrent cramping of right calf.  He denies any motor or sensory changes in right foot.  He was last seen in March of this year with a patent right lower extremity bypass.  He is on Eliquis and has history of pacemaker.  He is also taking aspirin and statin daily.  He is a diabetic with last hemoglobin A1c of 6.5%.  He is seen today in a wheelchair as he is trying to avoid pressure on right heel.  Patient states he is a non-smoker.  When asked why there is a cigarette odor patient states he lives with smokers.  The patient's PMH, PSH, SH, and FamHx were reviewed and are unchanged from prior visit.  Current Outpatient Medications  Medication Sig Dispense Refill  . ACCU-CHEK FASTCLIX LANCETS MISC Check blood sugar TID & QHS 102 each 2  . apixaban (ELIQUIS) 5 MG TABS tablet Take 1 tablet (5 mg total) by mouth 2 (two) times daily. 180 tablet 3  . aspirin 81 MG chewable tablet Chew 81 mg by mouth daily.     Marland Kitchen atorvastatin (LIPITOR) 20 MG tablet Take 1 tablet by mouth once daily 90 tablet 0  . blood glucose meter kit and supplies KIT Check blood sugar TID & QHS 1 each 0  . Blood Glucose Monitoring Suppl (ACCU-CHEK ADVANTAGE DIABETES) kit Use as instructed 1 each 0  . furosemide (LASIX) 40 MG tablet Take 40 mg by mouth 2 (two) times daily.    Marland Kitchen glucose blood (ACCU-CHEK AVIVA PLUS) test strip Use as  instructed 100 each 12  . glucose blood (CHOICE DM FORA G20 TEST STRIPS) test strip Check blood sugar TID & QHS 100 each 12  . glucose blood test strip Use as instructed 100 each 12  . hydrALAZINE (APRESOLINE) 100 MG tablet TAKE 1 TABLET BY MOUTH THREE TIMES DAILY 270 tablet 0  . Insulin NPH Isophane & Regular (NOVOLIN 70/30 [place]) Inject 46-56 Units into the skin See admin instructions. Inject 56 units SQ in the morning and inject 48 units SQ in the evening    . lisinopril (ZESTRIL) 10 MG tablet Take 1 tablet by mouth once daily 90 tablet 0  . metoprolol tartrate (LOPRESSOR) 100 MG tablet Take 1 tablet by mouth twice daily 180 tablet 0  . Multiple Vitamin (MULTIVITAMIN WITH MINERALS) TABS tablet Take 1 tablet by mouth daily.    . prazosin (MINIPRESS) 2 MG capsule TAKE 1 CAPSULE BY MOUTH IN THE EVENING AFTER SUPPER 90 capsule 0   No current facility-administered medications for this visit.    On ROS today: 10 system ROS is negative unless otherwise noted in HPI   Physical Examination   Vitals:   11/05/19 1028  BP: (!) 155/70  Pulse: 69  Resp: 16  Temp: 98 F (36.7 C)  TempSrc: Oral  SpO2: 98%  Weight: 269 lb (122 kg)  Height: 6' (1.829 m)   Body mass index is 36.48 kg/m.  General Alert, O x 3, WD, NAD  Pulmonary Sym exp, good B air movt, CTA B  Cardiac RRR, Nl S1, S2  Vascular Vessel Right Left  Radial Palpable Palpable  PT Monophasic and soft by Doppler Not palpable  DP Monophasic and soft by Doppler Not palpable    Gastro- intestinal soft, non-distended, non-tender to palpation,   Musculo- skeletal M/S 5/5 throughout  , Right medial heel with necrotic 1 x 1 cm ulceration  Neurologic Cranial nerves 2-12 intact , Pain and light touch intact in extremities , Motor exam as listed above    Non-Invasive Vascular Imaging   ABI   R:   ABI: 0.42,   TBI:  0.00  L:   ABI: 0.84    TBI: TMA   Medical Decision Making   Alexander [name] is a 58 y.o. male who presents  with: critical limb ischemia RLE with 1 week history of necrotic heel ulceration wounds.   Given new ulceration and significant drop in ABI I fear that right lower extremity bypass has thrombosed  Patient however has a monophasic DP and PT signal with sensation and motor intact  He will be scheduled for aortogram and right lower extremity arteriogram with possible intervention the first part of next week  Discussed risks of procedure with the patient however given tissue loss and drop in ABI this is a limb threatening situation if left untreated   Dagoberto Ligas PA-C [facility] Office: [phone]  Clinic MD: Scot Dock

## 2019-11-09 ENCOUNTER — Other Ambulatory Visit (HOSPITAL_COMMUNITY)
Admission: RE | Admit: 2019-11-09 | Discharge: 2019-11-09 | Disposition: A | Discharge status: Home / Self Care | Payer: Medicare Other | Source: Ambulatory Visit | Attending: Vascular Surgery | Admitting: Vascular Surgery

## 2019-11-09 DIAGNOSIS — Z20828 Contact with and (suspected) exposure to other viral communicable diseases: Secondary | ICD-10-CM | POA: Insufficient documentation

## 2019-11-09 DIAGNOSIS — Z01812 Encounter for preprocedural laboratory examination: Secondary | ICD-10-CM | POA: Insufficient documentation

## 2019-11-10 ENCOUNTER — Telehealth: Payer: Self-pay | Admitting: *Deleted

## 2019-11-10 ENCOUNTER — Other Ambulatory Visit: Payer: Self-pay | Admitting: *Deleted

## 2019-11-10 LAB — SARS CORONAVIRUS 2 (TAT 6-24 HRS): SARS Coronavirus 2: NEGATIVE

## 2019-11-10 NOTE — Telephone Encounter (Signed)
Patient notified of time change for 11/11/2019 procedure. To be at [facility] admitting at 6:30 am. Take 70 % of Novolin insulin tonight.NPO past MN tonight.    Instructed to take Metoprolol with sips of water in the am prior to leaving for the hospital hold all other medications and insulin till after this procedure. States  He has held Eliquis and had nasal swab done. Verbalized understanding.

## 2019-11-11 ENCOUNTER — Encounter (HOSPITAL_COMMUNITY)
Admission: RE | Disposition: A | Discharge status: Home / Self Care | Payer: Self-pay | Source: Home / Self Care | Attending: Vascular Surgery

## 2019-11-11 ENCOUNTER — Other Ambulatory Visit: Payer: Self-pay

## 2019-11-11 ENCOUNTER — Ambulatory Visit (HOSPITAL_COMMUNITY)
Admission: RE | Admit: 2019-11-11 | Discharge: 2019-11-11 | Disposition: A | Discharge status: Home / Self Care | Payer: Medicare Other | Attending: Vascular Surgery | Admitting: Vascular Surgery

## 2019-11-11 DIAGNOSIS — Z79899 Other long term (current) drug therapy: Secondary | ICD-10-CM | POA: Diagnosis not present

## 2019-11-11 DIAGNOSIS — Z794 Long term (current) use of insulin: Secondary | ICD-10-CM | POA: Insufficient documentation

## 2019-11-11 DIAGNOSIS — I70234 Atherosclerosis of native arteries of right leg with ulceration of heel and midfoot: Secondary | ICD-10-CM | POA: Insufficient documentation

## 2019-11-11 DIAGNOSIS — L97419 Non-pressure chronic ulcer of right heel and midfoot with unspecified severity: Secondary | ICD-10-CM | POA: Diagnosis not present

## 2019-11-11 DIAGNOSIS — Z7982 Long term (current) use of aspirin: Secondary | ICD-10-CM | POA: Insufficient documentation

## 2019-11-11 DIAGNOSIS — E11621 Type 2 diabetes mellitus with foot ulcer: Secondary | ICD-10-CM | POA: Insufficient documentation

## 2019-11-11 DIAGNOSIS — Z950 Presence of cardiac pacemaker: Secondary | ICD-10-CM | POA: Insufficient documentation

## 2019-11-11 DIAGNOSIS — I739 Peripheral vascular disease, unspecified: Secondary | ICD-10-CM | POA: Diagnosis not present

## 2019-11-11 DIAGNOSIS — Z7901 Long term (current) use of anticoagulants: Secondary | ICD-10-CM | POA: Diagnosis not present

## 2019-11-11 HISTORY — PX: PERIPHERAL VASCULAR BALLOON ANGIOPLASTY: CATH118281

## 2019-11-11 HISTORY — PX: ABDOMINAL AORTOGRAM W/LOWER EXTREMITY: CATH118223

## 2019-11-11 LAB — POCT I-STAT, CHEM 8
BUN: 99 mg/dL — ABNORMAL HIGH (ref 6–20)
Calcium, Ion: 1.24 mmol/L (ref 1.15–1.40)
Chloride: 105 mmol/L (ref 98–111)
Creatinine, Ser: 2.8 mg/dL — ABNORMAL HIGH (ref 0.61–1.24)
Glucose, Bld: 223 mg/dL — ABNORMAL HIGH (ref 70–99)
HCT: 27 % — ABNORMAL LOW (ref 39.0–52.0)
Hemoglobin: 9.2 g/dL — ABNORMAL LOW (ref 13.0–17.0)
Potassium: 4.7 mmol/L (ref 3.5–5.1)
Sodium: 138 mmol/L (ref 135–145)
TCO2: 21 mmol/L — ABNORMAL LOW (ref 22–32)

## 2019-11-11 LAB — POCT ACTIVATED CLOTTING TIME
Activated Clotting Time: 219 seconds
Activated Clotting Time: 213 seconds
Activated Clotting Time: 186 seconds
Activated Clotting Time: 169 seconds

## 2019-11-11 LAB — GLUCOSE, CAPILLARY: Glucose-Capillary: 216 mg/dL — ABNORMAL HIGH (ref 70–99)

## 2019-11-11 SURGERY — ABDOMINAL AORTOGRAM W/LOWER EXTREMITY
Anesthesia: LOCAL | Laterality: Right

## 2019-11-11 MED ORDER — ONDANSETRON HCL 4 MG/2ML IJ SOLN: 4.0000 mg | Freq: Four times a day (QID) | INTRAMUSCULAR | Status: DC | PRN

## 2019-11-11 MED ORDER — IODIXANOL 320 MG/ML IV SOLN
INTRAVENOUS | Status: DC | PRN
  Administered 2019-11-11: 55 mL

## 2019-11-11 MED ORDER — LIDOCAINE HCL (PF) 1 % IJ SOLN
INTRAMUSCULAR | Status: AC
  Filled 2019-11-11: qty 30

## 2019-11-11 MED ORDER — CLOPIDOGREL BISULFATE 75 MG PO TABS: 75.0000 mg | ORAL_TABLET | Freq: Every day | ORAL | 0 refills | Status: DC

## 2019-11-11 MED ORDER — HYDRALAZINE HCL 20 MG/ML IJ SOLN: 5.0000 mg | INTRAMUSCULAR | Status: DC | PRN

## 2019-11-11 MED ORDER — LIDOCAINE HCL (PF) 1 % IJ SOLN
INTRAMUSCULAR | Status: DC | PRN
  Administered 2019-11-11: 15 mL

## 2019-11-11 MED ORDER — HEPARIN (PORCINE) IN NACL 1000-0.9 UT/500ML-% IV SOLN
INTRAVENOUS | Status: DC | PRN
  Administered 2019-11-11 (×2): 500 mL

## 2019-11-11 MED ORDER — SODIUM CHLORIDE 0.9 % IV SOLN
INTRAVENOUS | Status: AC
  Administered 2019-11-11: 200 mL via INTRAVENOUS

## 2019-11-11 MED ORDER — HEPARIN SODIUM (PORCINE) 1000 UNIT/ML IJ SOLN
INTRAMUSCULAR | Status: AC
  Filled 2019-11-11: qty 1

## 2019-11-11 MED ORDER — NITROGLYCERIN 1 MG/10 ML FOR IR/CATH LAB
INTRA_ARTERIAL | Status: AC
  Filled 2019-11-11: qty 10

## 2019-11-11 MED ORDER — CLOPIDOGREL BISULFATE 300 MG PO TABS
ORAL_TABLET | ORAL | Status: AC
  Filled 2019-11-11: qty 1

## 2019-11-11 MED ORDER — ACETAMINOPHEN 325 MG PO TABS: 650.0000 mg | ORAL_TABLET | ORAL | Status: DC | PRN

## 2019-11-11 MED ORDER — SODIUM CHLORIDE 0.9% FLUSH: 3.0000 mL | INTRAVENOUS | Status: DC | PRN

## 2019-11-11 MED ORDER — CLOPIDOGREL BISULFATE 75 MG PO TABS: 75.0000 mg | ORAL_TABLET | Freq: Every day | ORAL | Status: DC

## 2019-11-11 MED ORDER — HEPARIN (PORCINE) IN NACL 1000-0.9 UT/500ML-% IV SOLN
INTRAVENOUS | Status: AC
  Filled 2019-11-11: qty 1000

## 2019-11-11 MED ORDER — NITROGLYCERIN 1 MG/10 ML FOR IR/CATH LAB
INTRA_ARTERIAL | Status: DC | PRN
  Administered 2019-11-11: 300 ug via INTRA_ARTERIAL

## 2019-11-11 MED ORDER — CLOPIDOGREL BISULFATE 75 MG PO TABS
300.0000 mg | ORAL_TABLET | Freq: Once | ORAL | Status: AC
  Administered 2019-11-11: 300 mg via ORAL

## 2019-11-11 MED ORDER — LABETALOL HCL 5 MG/ML IV SOLN: 10.0000 mg | INTRAVENOUS | Status: DC | PRN

## 2019-11-11 MED ORDER — SODIUM CHLORIDE 0.9 % IV SOLN: INTRAVENOUS | Status: DC

## 2019-11-11 MED ORDER — HEPARIN SODIUM (PORCINE) 1000 UNIT/ML IJ SOLN
INTRAMUSCULAR | Status: DC | PRN
  Administered 2019-11-11: 12000 [IU] via INTRAVENOUS

## 2019-11-11 MED ORDER — SODIUM CHLORIDE 0.9 % IV SOLN: 250.0000 mL | INTRAVENOUS | Status: DC | PRN

## 2019-11-11 MED ORDER — SODIUM CHLORIDE 0.9% FLUSH: 3.0000 mL | Freq: Two times a day (BID) | INTRAVENOUS | Status: DC

## 2019-11-11 SURGICAL SUPPLY — 20 items
BALLN STERLING OTW 3X150X150 (BALLOONS) ×2
BALLOON STERLING OTW 3X150X150 (BALLOONS) ×1 IMPLANT
CATH CROSS OVER TEMPO 5F (CATHETERS) ×2 IMPLANT
CATH CXI SUPP 2.6F 150 ST (CATHETERS) ×2 IMPLANT
CATH OMNI FLUSH 5F 65CM (CATHETERS) ×2 IMPLANT
CATH TEMPO AQUA 5F 100CM (CATHETERS) ×2 IMPLANT
DEVICE TORQUE .025-.038 (MISCELLANEOUS) ×2 IMPLANT
GLIDEWIRE ADV .035X260CM (WIRE) ×2 IMPLANT
GUIDEWIRE ANGLED .035X150CM (WIRE) ×2 IMPLANT
KIT ENCORE 26 ADVANTAGE (KITS) ×2 IMPLANT
KIT MICROPUNCTURE NIT STIFF (SHEATH) ×2 IMPLANT
KIT PV (KITS) ×2 IMPLANT
SHEATH HIGHFLEX ANSEL 6FRX55 (SHEATH) ×2 IMPLANT
SHEATH PINNACLE 5F 10CM (SHEATH) ×2 IMPLANT
SHEATH PINNACLE 6F 10CM (SHEATH) ×2 IMPLANT
SHEATH PROBE COVER 6X72 (BAG) ×2 IMPLANT
TRANSDUCER W/STOPCOCK (MISCELLANEOUS) ×2 IMPLANT
TRAY PV CATH (CUSTOM PROCEDURE TRAY) ×2 IMPLANT
WIRE BENTSON .035X145CM (WIRE) ×2 IMPLANT
WIRE G V18X300CM (WIRE) ×2 IMPLANT

## 2019-11-11 NOTE — H&P (Signed)
History and Physical Interval Note:  11/11/2019 8:17 AM  Alexander [name]  has presented today for surgery, with the diagnosis of PVD.  The various methods of treatment have been discussed with the patient and family. After consideration of risks, benefits and other options for treatment, the patient has consented to  Procedure(s): ABDOMINAL AORTOGRAM W/LOWER EXTREMITY (Right) as a surgical intervention.  The patient's history has been reviewed, patient examined, no change in status, stable for surgery.  I have reviewed the patient's chart and labs.  Questions were answered to the patient's satisfaction.    Aortogram, right lower extremity arteriogram and possible intervention.  Patient has ABI of 0.4 in the right lower extremity with tissue loss.  Suspect his bypass is occluded.  Will attempt endovascular intervention given multiple failed bypasses.  Alexander [name]  Established Critical Limb Ischemia Patient  History of Present Illness  Alexander [name] is a 58 y.o. (04-14-1961) male who presents with new painful ulceration of right medial heel. Surgical history significant for left common femoral to TP trunk bypass with vein graft and right amputation of left fourth and fifth toe which subsequently required left TMA in December 2014. In October 2018 he had a right SFA to below the knee popliteal artery bypass with vein graft and ray amputation of right fourth and fifth toes. Patient states current heel ulceration started about a week ago and patient also had concurrent cramping of right calf. He denies any motor or sensory changes in right foot. He was last seen in March of this year with a patent right lower extremity bypass. He is on Eliquis and has history of pacemaker. He is also taking aspirin and statin daily. He is a diabetic with last hemoglobin A1c of 6.5%. He is seen today in a wheelchair as he is trying to avoid pressure on right heel. Patient states he is a non-smoker. When asked why there  is a cigarette odor patient states he lives with smokers.  The patient's PMH, PSH, SH, and FamHx were reviewed and are unchanged from prior visit.        Current Outpatient Medications  Medication Sig Dispense Refill  . ACCU-CHEK FASTCLIX LANCETS MISC Check blood sugar TID & QHS 102 each 2  . apixaban (ELIQUIS) 5 MG TABS tablet Take 1 tablet (5 mg total) by mouth 2 (two) times daily. 180 tablet 3  . aspirin 81 MG chewable tablet Chew 81 mg by mouth daily.     Marland Kitchen atorvastatin (LIPITOR) 20 MG tablet Take 1 tablet by mouth once daily 90 tablet 0  . blood glucose meter kit and supplies KIT Check blood sugar TID & QHS 1 each 0  . Blood Glucose Monitoring Suppl (ACCU-CHEK ADVANTAGE DIABETES) kit Use as instructed 1 each 0  . furosemide (LASIX) 40 MG tablet Take 40 mg by mouth 2 (two) times daily.    Marland Kitchen glucose blood (ACCU-CHEK AVIVA PLUS) test strip Use as instructed 100 each 12  . glucose blood (CHOICE DM FORA G20 TEST STRIPS) test strip Check blood sugar TID & QHS 100 each 12  . glucose blood test strip Use as instructed 100 each 12  . hydrALAZINE (APRESOLINE) 100 MG tablet TAKE 1 TABLET BY MOUTH THREE TIMES DAILY 270 tablet 0  . Insulin NPH Isophane & Regular (NOVOLIN 70/30 [place]) Inject 46-56 Units into the skin See admin instructions. Inject 56 units SQ in the morning and inject 48 units SQ in the evening    . lisinopril (ZESTRIL)  10 MG tablet Take 1 tablet by mouth once daily 90 tablet 0  . metoprolol tartrate (LOPRESSOR) 100 MG tablet Take 1 tablet by mouth twice daily 180 tablet 0  . Multiple Vitamin (MULTIVITAMIN WITH MINERALS) TABS tablet Take 1 tablet by mouth daily.    . prazosin (MINIPRESS) 2 MG capsule TAKE 1 CAPSULE BY MOUTH IN THE EVENING AFTER SUPPER 90 capsule 0   No current facility-administered medications for this visit.   On ROS today: 10 system ROS is negative unless otherwise noted in HPI  Physical Examination      Vitals:   11/05/19 1028  BP: (!) 155/70  Pulse: 69  Resp:  16  Temp: 98 F (36.7 C)  TempSrc: Oral  SpO2: 98%  Weight: 269 lb (122 kg)  Height: 6' (1.829 m)   Body mass index is 36.48 kg/m.  General Alert, O x 3, WD, NAD  Pulmonary Sym exp, good B air movt, CTA B  Cardiac RRR, Nl S1, S2  Vascular Vessel Right Left   Radial Palpable Palpable   PT Monophasic and soft by Doppler Not palpable   DP Monophasic and soft by Doppler Not palpable  Gastro-  intestinal soft, non-distended, non-tender to palpation,   Musculo-  skeletal M/S 5/5 throughout , Right medial heel with necrotic 1 x 1 cm ulceration  Neurologic Cranial nerves 2-12 intact , Pain and light touch intact in extremities , Motor exam as listed above   Non-Invasive Vascular Imaging  ABI  R:  ABI: 0.42,  TBI: 0.00 L:  ABI: 0.84  TBI: TMA Medical Decision Making  Alexander [name] is a 58 y.o. male who presents with: critical limb ischemia RLE with 1 week history of necrotic heel ulceration wounds.  Given new ulceration and significant drop in ABI I fear that right lower extremity bypass has thrombosed  Patient however has a monophasic DP and PT signal with sensation and motor intact  He will be scheduled for aortogram and right lower extremity arteriogram with possible intervention the first part of next week  Discussed risks of procedure with the patient however given tissue loss and drop in ABI this is a limb threatening situation if left untreated Dagoberto Ligas PA-C  [facility] [place]: [phone]  Clinic MD: Gilda Crease, MD [facility] [place]: [phone] Pager: [place]

## 2019-11-11 NOTE — Progress Notes (Signed)
Site area: Left groin Site prior: Level 0 Manual Pressure applied for: 20 min Patient Status During pull: Calm Post Pull site: Level 0 Post pull instructions given Post pull pulses present: Left Pt dopplered Dressing applied Bedrest begins at: 1300 Comments: No complications

## 2019-11-11 NOTE — Op Note (Signed)
Patient name: Alexander [name] MRN: [id number] DOB: 08/24/1961 Sex: male  11/11/2019 Pre-operative Diagnosis: Critical limb ischemia of the right lower extremity with tissue loss in the setting of known SFA to below-knee popliteal bypass with GSV Post-operative diagnosis:  Same Surgeon:  Marty Heck, MD Procedure Performed: 1.  Ultrasound-guided access of the left common femoral artery 2.  CO2 aortogram 3.  Right lower extremity arteriogram with selection of third order branches including selection of right SFA to below-knee popliteal bypass 4.  Right below-knee popliteal, tibioperoneal trunk and posterior tibial artery angioplasty (3 mm x 150 mm Sterling)  Indications: Patient is a 58 year old male well-known to the vascular surgery service who previously underwent a right SFA to below-knee popliteal bypass with Dr. Randa Ngo in 2018.  Ultimately this occluded shortly after surgery and he had required a revision with Dr. Donzetta Matters.  He presented to clinic last week with a new wound on his right heel.  He presents today for right lower extremity arteriogram and possible intervention after risk benefits were discussed.  Findings:   CO2 aortogram showed single renal arteries bilaterally and no significant flow-limiting aortoiliac stenosis.  Right lower extremity arteriogram showed a patent common femoral as well as a patent SFA to below-knee popliteal bypass.  The native SFA occluded near Hunter's canal.  Ultimately patient appeared to have a near occlusive lesion in the native below-knee popliteal artery distal to the bypass as well as in the tibioperoneal trunk and a separate 90% stenosis in the mid posterior tibial artery.  All of this was treated with a 3 mm Sterling with no evidence of residual stenosis.  He now has excellent inline flow down a very robust posterior tibial artery into the ankle and retrograde filling of the anterior tibial artery.   Procedure:  The patient was identified in  the holding area and taken to room 8.  The patient was then placed supine on the table and prepped and draped in the usual sterile fashion.  A time out was called.  Ultrasound was used to evaluate the left common femoral artery.  It was patent .  A digital ultrasound image was acquired.  A micropuncture needle was used to access the left common femoral artery under ultrasound guidance.  An 018 wire was advanced without resistance and a micropuncture sheath was placed.  The 018 wire was removed and a benson wire was placed.  The micropuncture sheath was exchanged for a 5 french sheath.  An omniflush catheter was advanced over the wire to the level of L-1.  An abdominal angiogram was obtained.   Next I used a crossover catheter with a soft angled Glidewire to select the right iliac and then advanced our catheter into the distal right external iliac.  We got staged imaging of the right lower extremity with CO2 and it was apparent that his SFA to below-knee popliteal bypass was indeed patent.  It was difficult to evaluate runoff with CO2.  I then used a glidewire advantage to select the bypass and ultimately a long straight flush catheter was placed into the bypass.  We then got serial images of the right lower extremity with contrast.  Ultimately was apparent that the patient had a near occlusive likely 95% stenosis in the native below-knee popliteal artery as well as disease in the tibioperoneal trunk and a separate near occlusive lesion in the mid posterior tibial artery.  I used my Glidewire advantage to exchange for a long 6 [place] [employer], maintenance (IT)  in the left groin over the aortic bifurcation.  Patient was given 100 units/kg heparin.  I then used a V 18 with a CXI catheter to cross all these lesions into the native posterior tibial artery at the ankle.  A brief hand-injection was used to confirm we were in the true lumen.  I then used the 3 mm x 150 mm Sterling and angioplastied from the bypass into the native  below-knee popliteal artery into the tibioperoneal trunk and proximal to mid posterior tibial artery.  All this was inflated nominal pressure for 2 minutes.  I did shoot another picture and there was one additional lesion in the posterior tibial artery.  I went back down and treated this a second time with a 3 mm balloon.  Nitroglycerin was given.  One final injection showed excellent inline flow down the bypass as well as the below-knee popliteal artery, tibioperoneal trunk, and posterior tibial artery with filling of the plantar arch in the foot and retrograde filling of the AT.  Happy with the results I used my Glidewire and exchanged for a short 6 French sheath in the left groin.  I did a brief left groin injection with CO2 and I thought my access was too close to the hood of the bypass.  I was not comfortable using a mynx closure device.  He will be taken to PACU for manual pressure holding and sheath removal.  Complications: None  Condition: Stable  Plan: I will have him follow-up in 1 month with arterial duplex and ABIs  Marty Heck, MD [facility] [place] [facility] Office: [place]

## 2019-11-11 NOTE — Discharge Instructions (Signed)

## 2019-11-16 ENCOUNTER — Ambulatory Visit (HOSPITAL_COMMUNITY)
Admission: RE | Admit: 2019-11-16 | Discharge: 2019-11-16 | Disposition: A | Discharge status: Home / Self Care | Payer: Medicare Other | Source: Ambulatory Visit | Attending: Nephrology | Admitting: Nephrology

## 2019-11-16 ENCOUNTER — Other Ambulatory Visit: Payer: Self-pay

## 2019-11-16 VITALS — BP 136/58 | HR 70 | Temp 97.3°F | Resp 18

## 2019-11-16 DIAGNOSIS — E1122 Type 2 diabetes mellitus with diabetic chronic kidney disease: Secondary | ICD-10-CM | POA: Diagnosis not present

## 2019-11-16 DIAGNOSIS — N183 Chronic kidney disease, stage 3 unspecified: Secondary | ICD-10-CM

## 2019-11-16 LAB — IRON AND TIBC
Iron: 78 ug/dL (ref 45–182)
Saturation Ratios: 40 % — ABNORMAL HIGH (ref 17.9–39.5)
TIBC: 197 ug/dL — ABNORMAL LOW (ref 250–450)
UIBC: 119 ug/dL

## 2019-11-16 LAB — POCT HEMOGLOBIN-HEMACUE: Hemoglobin: 9.2 g/dL — ABNORMAL LOW (ref 13.0–17.0)

## 2019-11-16 LAB — FERRITIN: Ferritin: 186 ng/mL (ref 24–336)

## 2019-11-16 MED ORDER — DARBEPOETIN ALFA 200 MCG/0.4ML IJ SOSY
PREFILLED_SYRINGE | INTRAMUSCULAR | Status: AC
  Filled 2019-11-16: qty 0.4

## 2019-11-16 MED ORDER — DARBEPOETIN ALFA 200 MCG/0.4ML IJ SOSY
200.0000 ug | PREFILLED_SYRINGE | INTRAMUSCULAR | Status: DC
  Administered 2019-11-16: 200 ug via SUBCUTANEOUS

## 2019-11-25 ENCOUNTER — Other Ambulatory Visit: Payer: Self-pay | Admitting: Cardiology

## 2019-12-14 ENCOUNTER — Ambulatory Visit (HOSPITAL_COMMUNITY)
Admission: RE | Admit: 2019-12-14 | Discharge: 2019-12-14 | Disposition: A | Discharge status: Home / Self Care | Payer: Medicare Other | Source: Ambulatory Visit | Attending: Nephrology | Admitting: Nephrology

## 2019-12-14 ENCOUNTER — Other Ambulatory Visit: Payer: Self-pay

## 2019-12-14 VITALS — BP 145/62 | HR 82 | Temp 97.2°F | Resp 18

## 2019-12-14 DIAGNOSIS — E1122 Type 2 diabetes mellitus with diabetic chronic kidney disease: Secondary | ICD-10-CM | POA: Insufficient documentation

## 2019-12-14 DIAGNOSIS — N183 Chronic kidney disease, stage 3 unspecified: Secondary | ICD-10-CM | POA: Diagnosis not present

## 2019-12-14 LAB — IRON AND TIBC
Iron: 35 ug/dL — ABNORMAL LOW (ref 45–182)
Saturation Ratios: 15 % — ABNORMAL LOW (ref 17.9–39.5)
TIBC: 237 ug/dL — ABNORMAL LOW (ref 250–450)
UIBC: 202 ug/dL

## 2019-12-14 LAB — FERRITIN: Ferritin: 161 ng/mL (ref 24–336)

## 2019-12-14 LAB — POCT HEMOGLOBIN-HEMACUE: Hemoglobin: 8.9 g/dL — ABNORMAL LOW (ref 13.0–17.0)

## 2019-12-14 MED ORDER — DARBEPOETIN ALFA 200 MCG/0.4ML IJ SOSY
PREFILLED_SYRINGE | INTRAMUSCULAR | Status: AC
  Administered 2019-12-14: 200 ug via SUBCUTANEOUS
  Filled 2019-12-14: qty 0.4

## 2019-12-14 MED ORDER — DARBEPOETIN ALFA 200 MCG/0.4ML IJ SOSY: 200.0000 ug | PREFILLED_SYRINGE | INTRAMUSCULAR | Status: DC

## 2019-12-16 DIAGNOSIS — I495 Sick sinus syndrome: Secondary | ICD-10-CM | POA: Diagnosis not present

## 2019-12-16 DIAGNOSIS — Z45018 Encounter for adjustment and management of other part of cardiac pacemaker: Secondary | ICD-10-CM | POA: Diagnosis not present

## 2019-12-16 DIAGNOSIS — Z950 Presence of cardiac pacemaker: Secondary | ICD-10-CM | POA: Diagnosis not present

## 2019-12-20 ENCOUNTER — Other Ambulatory Visit: Payer: Self-pay | Admitting: Cardiology

## 2019-12-22 ENCOUNTER — Telehealth (HOSPITAL_COMMUNITY): Payer: Self-pay

## 2019-12-22 NOTE — Telephone Encounter (Signed)

## 2019-12-23 ENCOUNTER — Other Ambulatory Visit: Payer: Self-pay

## 2019-12-23 ENCOUNTER — Encounter: Payer: Self-pay | Admitting: Vascular Surgery

## 2019-12-23 ENCOUNTER — Ambulatory Visit (INDEPENDENT_AMBULATORY_CARE_PROVIDER_SITE_OTHER): Payer: Medicare Other | Admitting: Vascular Surgery

## 2019-12-23 ENCOUNTER — Ambulatory Visit (HOSPITAL_COMMUNITY)
Admission: RE | Admit: 2019-12-23 | Discharge: 2019-12-23 | Disposition: A | Discharge status: Home / Self Care | Payer: Medicare Other | Source: Ambulatory Visit | Attending: Vascular Surgery | Admitting: Vascular Surgery

## 2019-12-23 ENCOUNTER — Ambulatory Visit (INDEPENDENT_AMBULATORY_CARE_PROVIDER_SITE_OTHER)
Admission: RE | Admit: 2019-12-23 | Discharge: 2019-12-23 | Disposition: A | Discharge status: Home / Self Care | Payer: Medicare Other | Source: Ambulatory Visit | Attending: Vascular Surgery | Admitting: Vascular Surgery

## 2019-12-23 VITALS — BP 120/54 | HR 67 | Temp 98.0°F | Resp 18 | Ht 72.0 in | Wt 269.0 lb

## 2019-12-23 DIAGNOSIS — Z48812 Encounter for surgical aftercare following surgery on the circulatory system: Secondary | ICD-10-CM

## 2019-12-23 DIAGNOSIS — I739 Peripheral vascular disease, unspecified: Secondary | ICD-10-CM

## 2019-12-23 MED ORDER — CEPHALEXIN 500 MG PO CAPS: 500.0000 mg | ORAL_CAPSULE | Freq: Three times a day (TID) | ORAL | 2 refills | Status: DC

## 2019-12-23 NOTE — Telephone Encounter (Signed)
refill 

## 2019-12-23 NOTE — Progress Notes (Signed)
Patient name: Alexander [name] MRN: [id number] DOB: 01/12/1961 Sex: male  REASON FOR VISIT:   Follow-up of peripheral vascular disease.  HPI:   Alexander [name] is a pleasant 59 y.o. male with a complicated past surgical history.  On 10/27/2013 he had a left femoral to posterior tibial artery bypass. On 09/10/2017 the patient had a right superficial femoral artery to below-knee pop bypass with vein and ray amputation of the right fourth and fifth toes. On 09/12/2017 the patient had a revision of this bypass.  The graft was retunneled after it had occluded early postop. On 11/11/2019, the patient had angioplasty of the right below-knee popliteal artery tibial peroneal trunk and posterior tibial artery.  Since the patient was in the hospital he has developed a wound on the lateral aspect of his right foot.  His daughter has been doing dressing changes.  He denies fever or chills.  Past Medical History:  Diagnosis Date  . Alcohol abuse   . Anemia   . Arthritis   . Atrial fibrillation ([place])   . Cardiac syncope 10/07/14   rapid A fib with 8 sec pauses on converison with syncope- temp pacing wire placed then PPM  . Cataract    BILATERAL  . Chronic kidney disease   . Coronary artery disease   . Diabetes mellitus    dx---been  awhile  . Encounter for care of pacemaker 10/08/2014  . Hyperlipemia   . Hypertension   . Peripheral vascular disease ([place])   . Presence of permanent cardiac pacemaker 10/08/2014  . Sinus node dysfunction ([place]) 10/08/2014    Family History  Problem Relation Age of Onset  . Diabetes type II Mother   . Hypertension Mother   . Diabetes Mother   . Liver cancer Father   . Diabetes type II Sister   . Breast cancer Sister   . Diabetes Sister   . Hypertension Sister   . Diabetes type II Brother   . Kidney failure Brother   . Diabetes Brother   . Hypertension Brother   . Diabetes type II Sister     SOCIAL HISTORY: Social History   Tobacco Use  . Smoking  status: Former Smoker    Packs/day: 1.50    Years: 30.00    Pack years: 45.00    Types: Cigars    Quit date: 10/07/2014    Years since quitting: 5.2  . Smokeless tobacco: Never Used  Substance Use Topics  . Alcohol use: Yes    Alcohol/week: 35.0 standard drinks    Types: 35 Cans of beer per week    Comment: 6 of 12 oz beers every other day    Allergies  Allergen Reactions  . Pletal [Cilostazol] Palpitations    "Can hear heart beating loudly".  . Sulfa Antibiotics Rash    Current Outpatient Medications  Medication Sig Dispense Refill  . ACCU-CHEK FASTCLIX LANCETS MISC Check blood sugar TID & QHS 102 each 2  . apixaban (ELIQUIS) 5 MG TABS tablet Take 1 tablet (5 mg total) by mouth 2 (two) times daily. 180 tablet 3  . aspirin 81 MG chewable tablet Chew 81 mg by mouth daily.     Marland Kitchen atorvastatin (LIPITOR) 20 MG tablet Take 1 tablet by mouth once daily (Patient taking differently: Take 20 mg by mouth daily. ) 90 tablet 0  . blood glucose meter kit and supplies KIT Check blood sugar TID & QHS 1 each 0  . Blood Glucose Monitoring Suppl (ACCU-CHEK ADVANTAGE DIABETES)  kit Use as instructed 1 each 0  . furosemide (LASIX) 40 MG tablet Take 40 mg by mouth 2 (two) times daily.    Marland Kitchen glucose blood (ACCU-CHEK AVIVA PLUS) test strip Use as instructed 100 each 12  . glucose blood (CHOICE DM FORA G20 TEST STRIPS) test strip Check blood sugar TID & QHS 100 each 12  . glucose blood test strip Use as instructed 100 each 12  . hydrALAZINE (APRESOLINE) 100 MG tablet TAKE 1 TABLET BY MOUTH THREE TIMES DAILY (Patient taking differently: Take 100 mg by mouth 3 (three) times daily. ) 270 tablet 0  . Insulin NPH Isophane & Regular (NOVOLIN 70/30 [place]) Inject 46-56 Units into the skin See admin instructions. Inject 56 units subcutaneously in the morning & inject 46 units subcutaneously in the evening.    Marland Kitchen lisinopril (ZESTRIL) 10 MG tablet Take 1 tablet by mouth once daily (Patient taking differently: Take 10 mg  by mouth daily. ) 90 tablet 0  . metoprolol tartrate (LOPRESSOR) 100 MG tablet Take 1 tablet by mouth twice daily (Patient taking differently: Take 100 mg by mouth 2 (two) times daily. ) 180 tablet 0  . Multiple Vitamin (MULTIVITAMIN WITH MINERALS) TABS tablet Take 1 tablet by mouth daily.    . mupirocin ointment (BACTROBAN) 2 % Apply 1 application topically 2 (two) times daily. Applied to wound on heel    . prazosin (MINIPRESS) 2 MG capsule TAKE 1 CAPSULE BY MOUTH EVERY  EVENING AFTER SUPPER 90 capsule 0  . clopidogrel (PLAVIX) 75 MG tablet Take 1 tablet (75 mg total) by mouth daily. (Patient not taking: Reported on 12/23/2019) 30 tablet 0  . doxycycline (DORYX) 100 MG EC tablet Take 100 mg by mouth 2 (two) times daily.     No current facility-administered medications for this visit.    REVIEW OF SYSTEMS:  '[X]'  denotes positive finding, '[ ]'  denotes negative finding Cardiac  Comments:  Chest pain or chest pressure:    Shortness of breath upon exertion:    Short of breath when lying flat:    Irregular heart rhythm:        Vascular    Pain in calf, thigh, or hip brought on by ambulation:    Pain in feet at night that wakes you up from your sleep:     Blood clot in your veins:    Leg swelling:  x       Pulmonary    Oxygen at home:    Productive cough:     Wheezing:         Neurologic    Sudden weakness in arms or legs:     Sudden numbness in arms or legs:     Sudden onset of difficulty speaking or slurred speech:    Temporary loss of vision in one eye:     Problems with dizziness:         Gastrointestinal    Blood in stool:     Vomited blood:         Genitourinary    Burning when urinating:     Blood in urine:        Psychiatric    Major depression:         Hematologic    Bleeding problems:    Problems with blood clotting too easily:        Skin    Rashes or ulcers:        Constitutional    Fever or chills:  PHYSICAL EXAM:   Vitals:   12/23/19 1511  BP: (!)  120/54  Pulse: 67  Resp: 18  Temp: 98 F (36.7 C)  TempSrc: Oral  SpO2: 96%  Weight: 269 lb (122 kg)  Height: 6' (1.829 m)    GENERAL: The patient is a well-nourished male, in no acute distress. The vital signs are documented above. CARDIAC: There is a regular rate and rhythm.  VASCULAR: I do not detect carotid bruits. He has palpable femoral pulses. He has monophasic Doppler signals in the right foot. He has a biphasic posterior tibial signal on the left. PULMONARY: There is good air exchange bilaterally without wheezing or rales. ABDOMEN: Soft and non-tender with normal pitched bowel sounds.  MUSCULOSKELETAL: There are no major deformities or cyanosis. NEUROLOGIC: No focal weakness or paresthesias are detected. SKIN: He has a wound on the lateral aspect of the right foot which I debrided.  This measures approximately 1-1/2 cm in diameter. There is a wound on the medial aspect of the right heel.      PSYCHIATRIC: The patient has a normal affect.  DATA:    ARTERIAL DUPLEX: I have independently interpreted the arterial duplex of the right lower extremity today.  The right lower extremity bypass is patent with monophasic flow throughout.  Peak systolic velocity in the inflow artery is 243 cm/s suggesting a 50 to 74% stenosis.  There are elevated velocities to 188 cm/s in the outflow artery.  ARTERIAL DOPPLER STUDY: I have independently interpreted his arterial Doppler study today.  On the right side he has a monophasic dorsalis pedis and posterior tibial signal.  ABI is 72%.  Toe pressures 48 mmHg.  On the left side there is a biphasic posterior tibial signal with a monophasic dorsalis pedis signal.  ABI is 78%.  Toe pressure is 68 mmHg.  LABS: His most recent creatinine was 2.8.  MEDICAL ISSUES:   NONHEALING WOUNDS RIGHT FOOT: This patient has a wound on the lateral aspect of his right foot which I have debrided.  It had reasonable bleeding so I think there is a  reasonable chance of this healing with aggressive wound care.  I have recommended twice daily wet-to-dry dressing changes to help clean this up.  In addition I have started him on Keflex.  We will have him see him back in 2 weeks.  Based on his most recent arteriogram he has no significant inflow disease on the right and has a functioning right superficial femoral artery to below-knee popliteal artery bypass.  His tibial artery occlusive disease was addressed with balloon angioplasty with an excellent result.  Not sure there is much more to do from a vascular standpoint to improve the circulation.  He understands that this could become a limb threatening problem.  [name] [facility] [phone]

## 2019-12-25 ENCOUNTER — Telehealth: Payer: Self-pay | Admitting: *Deleted

## 2019-12-25 MED ORDER — CIPROFLOXACIN HCL 500 MG PO TABS: 500.0000 mg | ORAL_TABLET | Freq: Two times a day (BID) | ORAL | 1 refills | Status: DC

## 2019-12-25 NOTE — Telephone Encounter (Signed)
Patient called and said he was having an adverse reaction to the Keflex . Spoke with Dr Donzetta Matters he gave verbal order to change antibiotic to Cipro. Rx sent to patient pharmacy.

## 2020-01-11 ENCOUNTER — Ambulatory Visit (HOSPITAL_COMMUNITY)
Admission: RE | Admit: 2020-01-11 | Discharge: 2020-01-11 | Disposition: A | Discharge status: Home / Self Care | Payer: Medicare Other | Source: Ambulatory Visit | Attending: Nephrology | Admitting: Nephrology

## 2020-01-11 ENCOUNTER — Other Ambulatory Visit: Payer: Self-pay

## 2020-01-11 VITALS — BP 99/50 | HR 66 | Temp 97.0°F | Resp 18

## 2020-01-11 DIAGNOSIS — N183 Chronic kidney disease, stage 3 unspecified: Secondary | ICD-10-CM

## 2020-01-11 DIAGNOSIS — E1122 Type 2 diabetes mellitus with diabetic chronic kidney disease: Secondary | ICD-10-CM | POA: Diagnosis not present

## 2020-01-11 LAB — IRON AND TIBC
Iron: 25 ug/dL — ABNORMAL LOW (ref 45–182)
Saturation Ratios: 11 % — ABNORMAL LOW (ref 17.9–39.5)
TIBC: 223 ug/dL — ABNORMAL LOW (ref 250–450)
UIBC: 198 ug/dL

## 2020-01-11 LAB — FERRITIN: Ferritin: 185 ng/mL (ref 24–336)

## 2020-01-11 MED ORDER — DARBEPOETIN ALFA 200 MCG/0.4ML IJ SOSY
PREFILLED_SYRINGE | INTRAMUSCULAR | Status: AC
  Administered 2020-01-11: 09:00:00 200 ug
  Filled 2020-01-11: qty 0.4

## 2020-01-11 MED ORDER — DARBEPOETIN ALFA 200 MCG/0.4ML IJ SOSY: 200.0000 ug | PREFILLED_SYRINGE | INTRAMUSCULAR | Status: DC

## 2020-01-11 NOTE — Progress Notes (Signed)
Pt here for monthly labs and Aranesp injection.  HGB was 7.0 via hemocue. Pt has no bleeding, chest pain or shortness of breath.  Dr Justin Mend notified.  Orders received to change visits to every 2 weeks and to schedule for 2 Feraheme infusions.  This was discussed with Mr Glazier.  He is to call to schedule his Feraheme infusion once he knows his schedule.

## 2020-01-13 ENCOUNTER — Other Ambulatory Visit (HOSPITAL_COMMUNITY): Payer: Self-pay

## 2020-01-13 ENCOUNTER — Telehealth (HOSPITAL_COMMUNITY): Payer: Self-pay

## 2020-01-13 LAB — POCT HEMOGLOBIN-HEMACUE: Hemoglobin: 7 g/dL — ABNORMAL LOW (ref 13.0–17.0)

## 2020-01-13 NOTE — Telephone Encounter (Signed)

## 2020-01-14 ENCOUNTER — Ambulatory Visit (HOSPITAL_COMMUNITY)
Admission: RE | Admit: 2020-01-14 | Discharge: 2020-01-14 | Disposition: A | Discharge status: Home / Self Care | Payer: Medicare Other | Source: Ambulatory Visit | Attending: Physician Assistant | Admitting: Physician Assistant

## 2020-01-14 ENCOUNTER — Encounter: Payer: Self-pay | Admitting: Physician Assistant

## 2020-01-14 ENCOUNTER — Other Ambulatory Visit: Payer: Self-pay

## 2020-01-14 ENCOUNTER — Encounter (HOSPITAL_COMMUNITY)
Admission: RE | Admit: 2020-01-14 | Discharge: 2020-01-14 | Disposition: A | Discharge status: Home / Self Care | Payer: Medicare Other | Source: Ambulatory Visit | Attending: Nephrology | Admitting: Nephrology

## 2020-01-14 ENCOUNTER — Ambulatory Visit (INDEPENDENT_AMBULATORY_CARE_PROVIDER_SITE_OTHER): Payer: Medicare Other | Admitting: Physician Assistant

## 2020-01-14 VITALS — BP 99/64 | HR 80 | Temp 98.7°F | Resp 18 | Ht 72.0 in | Wt 270.0 lb

## 2020-01-14 DIAGNOSIS — I739 Peripheral vascular disease, unspecified: Secondary | ICD-10-CM | POA: Insufficient documentation

## 2020-01-14 DIAGNOSIS — N183 Chronic kidney disease, stage 3 unspecified: Secondary | ICD-10-CM | POA: Diagnosis not present

## 2020-01-14 DIAGNOSIS — E1122 Type 2 diabetes mellitus with diabetic chronic kidney disease: Secondary | ICD-10-CM | POA: Insufficient documentation

## 2020-01-14 MED ORDER — SODIUM CHLORIDE 0.9 % IV SOLN
510.0000 mg | INTRAVENOUS | Status: DC
  Administered 2020-01-14: 510 mg via INTRAVENOUS
  Filled 2020-01-14: qty 17

## 2020-01-14 NOTE — H&P (View-Only) (Signed)
[facility] [place] HISTORY AND PHYSICAL   History of Present Illness:  Patient is a 59 y.o. year old male who presents for evaluation right LE s/p angiogram with intervention  Right below-knee popliteal, tibioperoneal trunk and posterior tibial artery angioplasty (3 mm x 150 mm [place]).  He previously underwent a right SFA to below-knee popliteal bypass with Dr. Randa Ngo in 2018.  Ultimately this occluded shortly after surgery and he had required a revision with Dr. Donzetta Matters.  He presented to clinic last week with a new wound on his right heel.  He was seen by Dr. Scot Dock on 12/23/19 and he had developed a new right lateral foot wound since the angiogram.  He has been walking on the outside of his foot to keep pressure off his heel.  The wound has a bad odor and has gotten bigger.    Past surgical history:  On 10/27/2013 he had a left femoral to posterior tibial artery bypass. On 09/10/2017 the patient had a right superficial femoral artery to below-knee pop bypass with vein and ray amputation of the right fourth and fifth toes. On 09/12/2017 the patient had a revision of this bypass.  The graft was retunneled after it had occluded early postop. On 11/11/2019, the patient had angioplasty of the right below-knee popliteal artery tibial peroneal trunk and posterior tibial artery.   Past Medical History:  Diagnosis Date  . Alcohol abuse   . Anemia   . Arthritis   . Atrial fibrillation ([place])   . Cardiac syncope 10/07/14   rapid A fib with 8 sec pauses on converison with syncope- temp pacing wire placed then PPM  . Cataract    BILATERAL  . Chronic kidney disease   . Coronary artery disease   . Diabetes mellitus    dx---been  awhile  . Encounter for care of pacemaker 10/08/2014  . Hyperlipemia   . Hypertension   . Peripheral vascular disease ([place])   . Presence of permanent cardiac pacemaker 10/08/2014  . Sinus node dysfunction ([place]) 10/08/2014    Past Surgical History:   Procedure Laterality Date  . ABDOMINAL AORTOGRAM W/LOWER EXTREMITY N/A 09/09/2017   Procedure: ABDOMINAL AORTOGRAM W/LOWER EXTREMITY;  Surgeon: Angelia Mould, MD;  Location: [place] [facility];  Service: Cardiovascular;  Laterality: N/A;  . ABDOMINAL AORTOGRAM W/LOWER EXTREMITY Right 11/11/2019   Procedure: ABDOMINAL AORTOGRAM W/LOWER EXTREMITY;  Surgeon: Marty Heck, MD;  Location: [place] [facility];  Service: Cardiovascular;  Laterality: Right;  . AMPUTATION Left 08/19/2013   Procedure: AMPUTATION RAY;  Surgeon: Alta Corning, MD;  Location: [place];  Service: Orthopedics;  Laterality: Left;  ray amputation left 5th  . AMPUTATION Left 10/27/2013   Procedure: AMPUTATION DIGIT-LEFT 4TH TOE, 4th and 5th metatarsal.;  Surgeon: Angelia Mould, MD;  Location: [place];  Service: Vascular;  Laterality: Left;  . AMPUTATION Left 11/04/2013   Procedure: LEFT FOOT TRANS-METATARSAL AMPUTATION WITH WOUND CLOSURE ;  Surgeon: Alta Corning, MD;  Location: [place];  Service: Orthopedics;  Laterality: Left;  . AMPUTATION Right 09/10/2017   Procedure: RIGHT FOURTH AND FIFTH TOE AMPUTATION;  Surgeon: Angelia Mould, MD;  Location: [place];  Service: Vascular;  Laterality: Right;  . CARDIAC CATHETERIZATION  10/07/2014   Procedure: LEFT HEART CATH AND CORONARY ANGIOGRAPHY;  Surgeon: Laverda Page, MD;  Location: [facility];  Service: Cardiovascular;;  . EMBOLECTOMY Right 09/12/2017   Procedure: Thrombectomy  & Redo Right Below Knee Popliteal Artery Bypass  Graft  ;  Surgeon: Waynetta Sandy, MD;  Location: [place];  Service: Vascular;  Laterality: Right;  . FEMORAL-TIBIAL BYPASS GRAFT Left 10/27/2013   Procedure: BYPASS GRAFT LEFT FEMORAL- POSTERIOR TIBIAL ARTERY;  Surgeon: Angelia Mould, MD;  Location: [place];  Service: Vascular;  Laterality: Left;  . FEMORAL-TIBIAL BYPASS GRAFT Right 09/10/2017   Procedure: RIGHT SUPERFICIAL  FEMORAL ARTERY-BELOW KNEE POPLITEAL ARTERY  BYPASS GRAFT WITH VEIN;  Surgeon: Angelia Mould, MD;  Location: [place];  Service: Vascular;  Laterality: Right;  . I & D EXTREMITY Left 10/27/2013   Procedure: IRRIGATION AND DEBRIDEMENT EXTREMITY- LEFT FOOT;  Surgeon: Angelia Mould, MD;  Location: [place];  Service: Vascular;  Laterality: Left;  . INTRAOPERATIVE ARTERIOGRAM Right 09/10/2017   Procedure: INTRA OPERATIVE ARTERIOGRAM;  Surgeon: Angelia Mould, MD;  Location: [place];  Service: Vascular;  Laterality: Right;  . IR ANGIOGRAM FOLLOW UP STUDY  09/09/2017  . LEFT HEART CATHETERIZATION WITH CORONARY ANGIOGRAM N/A 11/09/2014   Procedure: LEFT HEART CATHETERIZATION WITH CORONARY ANGIOGRAM;  Surgeon: Laverda Page, MD;  Location: [facility] CATH LAB;  Service: Cardiovascular;  Laterality: N/A;  . LOWER EXTREMITY ANGIOGRAM Right 11/08/2015   Procedure: Lower Extremity Angiogram;  Surgeon: Serafina Mitchell, MD;  Location: [place] [facility];  Service: Cardiovascular;  Laterality: Right;  . LUMBAR LAMINECTOMY  06/13/2012   Procedure: MICRODISCECTOMY LUMBAR LAMINECTOMY;  Surgeon: Jessy Oto, MD;  Location: [place];  Service: Orthopedics;  Laterality: N/A;  Central laminectomy L2-3, L3-4, L4-5  . PACEMAKER INSERTION  10/08/14   MDT Advisa MRI compatible dual chamber pacemaker implanted by Dr Caryl Comes for syncope with post-termination pauses  . PERCUTANEOUS CORONARY STENT INTERVENTION (PCI-S)  11/09/2014   des to lad & distal circumflex         Dr  [name]  . PERIPHERAL VASCULAR BALLOON ANGIOPLASTY Right 11/11/2019   Procedure: PERIPHERAL VASCULAR BALLOON ANGIOPLASTY;  Surgeon: Marty Heck, MD;  Location: [place] [facility];  Service: Cardiovascular;  Laterality: Right;  below knee popliteal, tibioperoneal trunk, posterior tibial arteries  . PERIPHERAL VASCULAR CATHETERIZATION N/A 11/08/2015   Procedure: Abdominal Aortogram;  Surgeon: Serafina Mitchell, MD;  Location: [place] [facility];  Service: Cardiovascular;  Laterality: N/A;   . PERIPHERAL VASCULAR CATHETERIZATION Right 11/08/2015   Procedure: Peripheral Vascular Atherectomy;  Surgeon: Serafina Mitchell, MD;  Location: [place] [facility];  Service: Cardiovascular;  Laterality: Right;  . PERIPHERAL VASCULAR CATHETERIZATION N/A 10/10/2016   Procedure: Lower Extremity Angiography;  Surgeon: Waynetta Sandy, MD;  Location: [place] [facility];  Service: Cardiovascular;  Laterality: N/A;  . PERIPHERAL VASCULAR CATHETERIZATION Right 10/10/2016   Procedure: Peripheral Vascular Atherectomy;  Surgeon: Waynetta Sandy, MD;  Location: [place] [facility];  Service: Cardiovascular;  Laterality: Right;  Popliteal  . PERMANENT PACEMAKER INSERTION N/A 10/08/2014   Procedure: PERMANENT PACEMAKER INSERTION;  Surgeon: Deboraha Sprang, MD;  Location: [facility] CATH LAB;  Service: Cardiovascular;  Laterality: N/A;  . TEMPORARY PACEMAKER INSERTION Bilateral 10/07/2014   Procedure: TEMPORARY PACEMAKER INSERTION;  Surgeon: Laverda Page, MD;  Location: [facility] CATH LAB;  Service: Cardiovascular;  Laterality: Bilateral;    ROS:   General:  No weight loss, Fever, chills  HEENT: No recent headaches, no nasal bleeding, no visual changes, no sore throat  Neurologic: No dizziness, blackouts, seizures. No recent symptoms of stroke or mini- stroke. No recent episodes of slurred speech, or temporary blindness.  Cardiac: No recent episodes of chest pain/pressure,  no shortness of breath at rest.  No shortness of breath with exertion.  Denies history of atrial fibrillation or irregular heartbeat  Vascular: No history of rest pain in feet.  No history of claudication.  No history of non-healing ulcer, No history of DVT   Pulmonary: No home oxygen, no productive cough, no hemoptysis,  No asthma or wheezing  Musculoskeletal:  '[ ]'  Arthritis, '[ ]'  Low back pain,  '[ ]'  Joint pain  Hematologic:No history of hypercoagulable state.  No history of easy bleeding.  No history of anemia   Gastrointestinal: No hematochezia or melena,  No gastroesophageal reflux, no trouble swallowing  Urinary: '[ ]'  chronic Kidney disease, '[ ]'  on HD - '[ ]'  MWF or '[ ]'  TTHS, '[ ]'  Burning with urination, '[ ]'  Frequent urination, '[ ]'  Difficulty urinating;   Skin: No rashes  Psychological: No history of anxiety,  No history of depression  Social History Social History   Tobacco Use  . Smoking status: Former Smoker    Packs/day: 1.50    Years: 30.00    Pack years: 45.00    Types: Cigars    Quit date: 10/07/2014    Years since quitting: 5.2  . Smokeless tobacco: Never Used  Substance Use Topics  . Alcohol use: Yes    Alcohol/week: 35.0 standard drinks    Types: 35 Cans of beer per week    Comment: 6 of 12 oz beers every other day  . Drug use: No    Family History Family History  Problem Relation Age of Onset  . Diabetes type II Mother   . Hypertension Mother   . Diabetes Mother   . Liver cancer Father   . Diabetes type II Sister   . Breast cancer Sister   . Diabetes Sister   . Hypertension Sister   . Diabetes type II Brother   . Kidney failure Brother   . Diabetes Brother   . Hypertension Brother   . Diabetes type II Sister     Allergies  Allergies  Allergen Reactions  . Keflex [Cephalexin]   . Pletal [Cilostazol] Palpitations    "Can hear heart beating loudly".  . Sulfa Antibiotics Rash     Current Outpatient Medications  Medication Sig Dispense Refill  . ACCU-CHEK FASTCLIX LANCETS MISC Check blood sugar TID & QHS 102 each 2  . apixaban (ELIQUIS) 5 MG TABS tablet Take 1 tablet (5 mg total) by mouth 2 (two) times daily. 180 tablet 3  . aspirin 81 MG chewable tablet Chew 81 mg by mouth daily.     Marland Kitchen atorvastatin (LIPITOR) 20 MG tablet Take 1 tablet by mouth once daily 90 tablet 2  . blood glucose meter kit and supplies KIT Check blood sugar TID & QHS 1 each 0  . Blood Glucose Monitoring Suppl (ACCU-CHEK ADVANTAGE DIABETES) kit Use as instructed 1 each 0  .  ciprofloxacin (CIPRO) 500 MG tablet Take 1 tablet (500 mg total) by mouth 2 (two) times daily. 20 tablet 1  . clopidogrel (PLAVIX) 75 MG tablet Take 1 tablet (75 mg total) by mouth daily. 30 tablet 0  . doxycycline (DORYX) 100 MG EC tablet Take 100 mg by mouth 2 (two) times daily.    . furosemide (LASIX) 40 MG tablet Take 40 mg by mouth 2 (two) times daily.    Marland Kitchen glucose blood (ACCU-CHEK AVIVA PLUS) test strip Use as instructed 100 each 12  . glucose blood (CHOICE DM FORA G20 TEST STRIPS) test strip Check  blood sugar TID & QHS 100 each 12  . glucose blood test strip Use as instructed 100 each 12  . hydrALAZINE (APRESOLINE) 100 MG tablet TAKE 1 TABLET BY MOUTH THREE TIMES DAILY (Patient taking differently: Take 100 mg by mouth 3 (three) times daily. ) 270 tablet 0  . Insulin NPH Isophane & Regular (NOVOLIN 70/30 [place]) Inject 46-56 Units into the skin See admin instructions. Inject 56 units subcutaneously in the morning & inject 46 units subcutaneously in the evening.    Marland Kitchen lisinopril (ZESTRIL) 10 MG tablet Take 1 tablet by mouth once daily 90 tablet 2  . metoprolol tartrate (LOPRESSOR) 100 MG tablet Take 1 tablet by mouth twice daily (Patient taking differently: Take 100 mg by mouth 2 (two) times daily. ) 180 tablet 0  . Multiple Vitamin (MULTIVITAMIN WITH MINERALS) TABS tablet Take 1 tablet by mouth daily.    . mupirocin ointment (BACTROBAN) 2 % Apply 1 application topically 2 (two) times daily. Applied to wound on heel    . prazosin (MINIPRESS) 2 MG capsule TAKE 1 CAPSULE BY MOUTH EVERY  EVENING AFTER SUPPER 90 capsule 0   No current facility-administered medications for this visit.    Physical Examination  Vitals:   01/14/20 0921  BP: 99/64  Pulse: 80  Resp: 18  Temp: 98.7 F (37.1 C)  TempSrc: Oral  SpO2: 96%  Weight: 270 lb (122.5 kg)  Height: 6' (1.829 m)    Body mass index is 36.62 kg/m.  General:  Alert and oriented, no acute distress HEENT: Normal Neck: No bruit or JVD  Pulmonary: Clear to auscultation bilaterally Cardiac: Regular Rate and Rhythm without murmur Abdomen: Soft, non-tender, non-distended, no mass, no scars Skin:     Extremity Pulses:  Right LE doppler signals brisk dorsalis pedis, posterior tibial  Musculoskeletal: No deformity or edema  Neurologic: Upper and lower extremity motor 5/5 and symmetric  DATA:  Right Fem pop bypass duplex 01/14/20: Right fem-pop bypass is patent per ultrasound  ASSESSMENT:  Sever PAD with complicated history S/P right fem pop bypass with most recent intervention of right LE angiogram and tibial angioplasty secondary to new right heel wound.  Since February 2021 he has developed a right later foot wound that has progressed in size and malodor.      PLAN:   I feel it is reasonable to perform irrigation and debridement of the right foot with brisk signals and open bypass.   I asked Dr. Oneida Alar to review the patients history and he looked at the wound and agrees with the plan to proceed to surgery.    Roxy Horseman PA-C [facility] [place] Office: [phone]  MD in clinic [place]

## 2020-01-14 NOTE — Discharge Instructions (Signed)

## 2020-01-14 NOTE — Progress Notes (Signed)
[facility]  HISTORY AND PHYSICAL   History of Present Illness:  Patient is a 59 y.o. year old male who presents for evaluation right LE s/p angiogram with intervention  Right below-knee popliteal, tibioperoneal trunk and posterior tibial artery angioplasty (3 mm x 150 mm [place]).  He previously underwent a right SFA to below-knee popliteal bypass with Dr. Randa Ngo in 2018.  Ultimately this occluded shortly after surgery and he had required a revision with Dr. Donzetta Matters.  He presented to clinic last week with a new wound on his right heel.  He was seen by Dr. Scot Dock on 12/23/19 and he had developed a new right lateral foot wound since the angiogram.  He has been walking on the outside of his foot to keep pressure off his heel.  The wound has a bad odor and has gotten bigger.    Past surgical history:  On 10/27/2013 he had a left femoral to posterior tibial artery bypass. On 09/10/2017 the patient had a right superficial femoral artery to below-knee pop bypass with vein and ray amputation of the right fourth and fifth toes. On 09/12/2017 the patient had a revision of this bypass.  The graft was retunneled after it had occluded early postop. On 11/11/2019, the patient had angioplasty of the right below-knee popliteal artery tibial peroneal trunk and posterior tibial artery.   Past Medical History:  Diagnosis Date  . Alcohol abuse   . Anemia   . Arthritis   . Atrial fibrillation ([place])   . Cardiac syncope 10/07/14   rapid A fib with 8 sec pauses on converison with syncope- temp pacing wire placed then PPM  . Cataract    BILATERAL  . Chronic kidney disease   . Coronary artery disease   . Diabetes mellitus    dx---been  awhile  . Encounter for care of pacemaker 10/08/2014  . Hyperlipemia   . Hypertension   . Peripheral vascular disease ([place])   . Presence of permanent cardiac pacemaker 10/08/2014  . Sinus node dysfunction ([place]) 10/08/2014    Past Surgical History:   Procedure Laterality Date  . ABDOMINAL AORTOGRAM W/LOWER EXTREMITY N/A 09/09/2017   Procedure: ABDOMINAL AORTOGRAM W/LOWER EXTREMITY;  Surgeon: Angelia Mould, MD;  Location: [place] [facility];  Service: Cardiovascular;  Laterality: N/A;  . ABDOMINAL AORTOGRAM W/LOWER EXTREMITY Right 11/11/2019   Procedure: ABDOMINAL AORTOGRAM W/LOWER EXTREMITY;  Surgeon: Marty Heck, MD;  Location: [place] [facility];  Service: Cardiovascular;  Laterality: Right;  . AMPUTATION Left 08/19/2013   Procedure: AMPUTATION RAY;  Surgeon: Alta Corning, MD;  Location: [place];  Service: Orthopedics;  Laterality: Left;  ray amputation left 5th  . AMPUTATION Left 10/27/2013   Procedure: AMPUTATION DIGIT-LEFT 4TH TOE, 4th and 5th metatarsal.;  Surgeon: Angelia Mould, MD;  Location: [place];  Service: Vascular;  Laterality: Left;  . AMPUTATION Left 11/04/2013   Procedure: LEFT FOOT TRANS-METATARSAL AMPUTATION WITH WOUND CLOSURE ;  Surgeon: Alta Corning, MD;  Location: [place];  Service: Orthopedics;  Laterality: Left;  . AMPUTATION Right 09/10/2017   Procedure: RIGHT FOURTH AND FIFTH TOE AMPUTATION;  Surgeon: Angelia Mould, MD;  Location: [place];  Service: Vascular;  Laterality: Right;  . CARDIAC CATHETERIZATION  10/07/2014   Procedure: LEFT HEART CATH AND CORONARY ANGIOGRAPHY;  Surgeon: Laverda Page, MD;  Location: [facility] CATH [facility];  Service: Cardiovascular;;  . EMBOLECTOMY Right 09/12/2017   Procedure: Thrombectomy  & Redo Right Below Knee Popliteal Artery Bypass  Graft  ;  Surgeon: Waynetta Sandy, MD;  Location: [place];  Service: Vascular;  Laterality: Right;  . FEMORAL-TIBIAL BYPASS GRAFT Left 10/27/2013   Procedure: BYPASS GRAFT LEFT FEMORAL- POSTERIOR TIBIAL ARTERY;  Surgeon: Angelia Mould, MD;  Location: [place];  Service: Vascular;  Laterality: Left;  . FEMORAL-TIBIAL BYPASS GRAFT Right 09/10/2017   Procedure: RIGHT SUPERFICIAL  FEMORAL ARTERY-BELOW KNEE POPLITEAL ARTERY  BYPASS GRAFT WITH VEIN;  Surgeon: Angelia Mould, MD;  Location: [place];  Service: Vascular;  Laterality: Right;  . I & D EXTREMITY Left 10/27/2013   Procedure: IRRIGATION AND DEBRIDEMENT EXTREMITY- LEFT FOOT;  Surgeon: Angelia Mould, MD;  Location: [place];  Service: Vascular;  Laterality: Left;  . INTRAOPERATIVE ARTERIOGRAM Right 09/10/2017   Procedure: INTRA OPERATIVE ARTERIOGRAM;  Surgeon: Angelia Mould, MD;  Location: [place];  Service: Vascular;  Laterality: Right;  . IR ANGIOGRAM FOLLOW UP STUDY  09/09/2017  . LEFT HEART CATHETERIZATION WITH CORONARY ANGIOGRAM N/A 11/09/2014   Procedure: LEFT HEART CATHETERIZATION WITH CORONARY ANGIOGRAM;  Surgeon: Laverda Page, MD;  Location: [facility] CATH LAB;  Service: Cardiovascular;  Laterality: N/A;  . LOWER EXTREMITY ANGIOGRAM Right 11/08/2015   Procedure: Lower Extremity Angiogram;  Surgeon: Serafina Mitchell, MD;  Location: [place] [facility];  Service: Cardiovascular;  Laterality: Right;  . LUMBAR LAMINECTOMY  06/13/2012   Procedure: MICRODISCECTOMY LUMBAR LAMINECTOMY;  Surgeon: Jessy Oto, MD;  Location: [place];  Service: Orthopedics;  Laterality: N/A;  Central laminectomy L2-3, L3-4, L4-5  . PACEMAKER INSERTION  10/08/14   MDT Advisa MRI compatible dual chamber pacemaker implanted by Dr Caryl Comes for syncope with post-termination pauses  . PERCUTANEOUS CORONARY STENT INTERVENTION (PCI-S)  11/09/2014   des to lad & distal circumflex         Dr  [name]  . PERIPHERAL VASCULAR BALLOON ANGIOPLASTY Right 11/11/2019   Procedure: PERIPHERAL VASCULAR BALLOON ANGIOPLASTY;  Surgeon: Marty Heck, MD;  Location: [place] [facility];  Service: Cardiovascular;  Laterality: Right;  below knee popliteal, tibioperoneal trunk, posterior tibial arteries  . PERIPHERAL VASCULAR CATHETERIZATION N/A 11/08/2015   Procedure: Abdominal Aortogram;  Surgeon: Serafina Mitchell, MD;  Location: [place] [facility];  Service: Cardiovascular;  Laterality: N/A;    . PERIPHERAL VASCULAR CATHETERIZATION Right 11/08/2015   Procedure: Peripheral Vascular Atherectomy;  Surgeon: Serafina Mitchell, MD;  Location: [place] [facility];  Service: Cardiovascular;  Laterality: Right;  . PERIPHERAL VASCULAR CATHETERIZATION N/A 10/10/2016   Procedure: Lower Extremity Angiography;  Surgeon: Waynetta Sandy, MD;  Location: [place] [facility];  Service: Cardiovascular;  Laterality: N/A;  . PERIPHERAL VASCULAR CATHETERIZATION Right 10/10/2016   Procedure: Peripheral Vascular Atherectomy;  Surgeon: Waynetta Sandy, MD;  Location: [place] [facility];  Service: Cardiovascular;  Laterality: Right;  Popliteal  . PERMANENT PACEMAKER INSERTION N/A 10/08/2014   Procedure: PERMANENT PACEMAKER INSERTION;  Surgeon: Deboraha Sprang, MD;  Location: [facility] CATH LAB;  Service: Cardiovascular;  Laterality: N/A;  . TEMPORARY PACEMAKER INSERTION Bilateral 10/07/2014   Procedure: TEMPORARY PACEMAKER INSERTION;  Surgeon: Laverda Page, MD;  Location: [facility] CATH LAB;  Service: Cardiovascular;  Laterality: Bilateral;    ROS:   General:  No weight loss, Fever, chills  HEENT: No recent headaches, no nasal bleeding, no visual changes, no sore throat  Neurologic: No dizziness, blackouts, seizures. No recent symptoms of stroke or mini- stroke. No recent episodes of slurred speech, or temporary blindness.  Cardiac: No recent episodes of chest  pain/pressure, no shortness of breath at rest.  No shortness of breath with exertion.  Denies history of atrial fibrillation or irregular heartbeat  Vascular: No history of rest pain in feet.  No history of claudication.  No history of non-healing ulcer, No history of DVT   Pulmonary: No home oxygen, no productive cough, no hemoptysis,  No asthma or wheezing  Musculoskeletal:  '[ ]'  Arthritis, '[ ]'  Low back pain,  '[ ]'  Joint pain  Hematologic:No history of hypercoagulable state.  No history of easy bleeding.  No history of  anemia  Gastrointestinal: No hematochezia or melena,  No gastroesophageal reflux, no trouble swallowing  Urinary: '[ ]'  chronic Kidney disease, '[ ]'  on HD - '[ ]'  MWF or '[ ]'  TTHS, '[ ]'  Burning with urination, '[ ]'  Frequent urination, '[ ]'  Difficulty urinating;   Skin: No rashes  Psychological: No history of anxiety,  No history of depression  Social History Social History   Tobacco Use  . Smoking status: Former Smoker    Packs/day: 1.50    Years: 30.00    Pack years: 45.00    Types: Cigars    Quit date: 10/07/2014    Years since quitting: 5.2  . Smokeless tobacco: Never Used  Substance Use Topics  . Alcohol use: Yes    Alcohol/week: 35.0 standard drinks    Types: 35 Cans of beer per week    Comment: 6 of 12 oz beers every other day  . Drug use: No    Family History Family History  Problem Relation Age of Onset  . Diabetes type II Mother   . Hypertension Mother   . Diabetes Mother   . Liver cancer Father   . Diabetes type II Sister   . Breast cancer Sister   . Diabetes Sister   . Hypertension Sister   . Diabetes type II Brother   . Kidney failure Brother   . Diabetes Brother   . Hypertension Brother   . Diabetes type II Sister     Allergies  Allergies  Allergen Reactions  . Keflex [Cephalexin]   . Pletal [Cilostazol] Palpitations    "Can hear heart beating loudly".  . Sulfa Antibiotics Rash     Current Outpatient Medications  Medication Sig Dispense Refill  . ACCU-CHEK FASTCLIX LANCETS MISC Check blood sugar TID & QHS 102 each 2  . apixaban (ELIQUIS) 5 MG TABS tablet Take 1 tablet (5 mg total) by mouth 2 (two) times daily. 180 tablet 3  . aspirin 81 MG chewable tablet Chew 81 mg by mouth daily.     Marland Kitchen atorvastatin (LIPITOR) 20 MG tablet Take 1 tablet by mouth once daily 90 tablet 2  . blood glucose meter kit and supplies KIT Check blood sugar TID & QHS 1 each 0  . Blood Glucose Monitoring Suppl (ACCU-CHEK ADVANTAGE DIABETES) kit Use as instructed 1 each 0  .  ciprofloxacin (CIPRO) 500 MG tablet Take 1 tablet (500 mg total) by mouth 2 (two) times daily. 20 tablet 1  . clopidogrel (PLAVIX) 75 MG tablet Take 1 tablet (75 mg total) by mouth daily. 30 tablet 0  . doxycycline (DORYX) 100 MG EC tablet Take 100 mg by mouth 2 (two) times daily.    . furosemide (LASIX) 40 MG tablet Take 40 mg by mouth 2 (two) times daily.    Marland Kitchen glucose blood (ACCU-CHEK AVIVA PLUS) test strip Use as instructed 100 each 12  . glucose blood (CHOICE DM FORA G20 TEST STRIPS) test strip  Check blood sugar TID & QHS 100 each 12  . glucose blood test strip Use as instructed 100 each 12  . hydrALAZINE (APRESOLINE) 100 MG tablet TAKE 1 TABLET BY MOUTH THREE TIMES DAILY (Patient taking differently: Take 100 mg by mouth 3 (three) times daily. ) 270 tablet 0  . Insulin NPH Isophane & Regular (NOVOLIN 70/30 [place]) Inject 46-56 Units into the skin See admin instructions. Inject 56 units subcutaneously in the morning & inject 46 units subcutaneously in the evening.    Marland Kitchen lisinopril (ZESTRIL) 10 MG tablet Take 1 tablet by mouth once daily 90 tablet 2  . metoprolol tartrate (LOPRESSOR) 100 MG tablet Take 1 tablet by mouth twice daily (Patient taking differently: Take 100 mg by mouth 2 (two) times daily. ) 180 tablet 0  . Multiple Vitamin (MULTIVITAMIN WITH MINERALS) TABS tablet Take 1 tablet by mouth daily.    . mupirocin ointment (BACTROBAN) 2 % Apply 1 application topically 2 (two) times daily. Applied to wound on heel    . prazosin (MINIPRESS) 2 MG capsule TAKE 1 CAPSULE BY MOUTH EVERY  EVENING AFTER SUPPER 90 capsule 0   No current facility-administered medications for this visit.    Physical Examination  Vitals:   01/14/20 0921  BP: 99/64  Pulse: 80  Resp: 18  Temp: 98.7 F (37.1 C)  TempSrc: Oral  SpO2: 96%  Weight: 270 lb (122.5 kg)  Height: 6' (1.829 m)    Body mass index is 36.62 kg/m.  General:  Alert and oriented, no acute distress HEENT: Normal Neck: No bruit or  JVD Pulmonary: Clear to auscultation bilaterally Cardiac: Regular Rate and Rhythm without murmur Abdomen: Soft, non-tender, non-distended, no mass, no scars Skin:     Extremity Pulses:  Right LE doppler signals brisk dorsalis pedis, posterior tibial  Musculoskeletal: No deformity or edema  Neurologic: Upper and lower extremity motor 5/5 and symmetric  DATA:  Right Fem pop bypass duplex 01/14/20: Right fem-pop bypass is patent per ultrasound  ASSESSMENT:  Sever PAD with complicated history S/P right fem pop bypass with most recent intervention of right LE angiogram and tibial angioplasty secondary to new right heel wound.  Since February 2021 he has developed a right later foot wound that has progressed in size and malodor.      PLAN:   I feel it is reasonable to perform irrigation and debridement of the right foot with brisk signals and open bypass.   I asked Dr. Oneida Alar to review the patients history and he looked at the wound and agrees with the plan to proceed to surgery.    Roxy Horseman PA-C [facility] [place] Office: [phone]  MD in clinic [place]

## 2020-01-25 ENCOUNTER — Encounter (HOSPITAL_COMMUNITY): Payer: Self-pay | Admitting: Vascular Surgery

## 2020-01-25 ENCOUNTER — Other Ambulatory Visit: Payer: Self-pay

## 2020-01-25 ENCOUNTER — Other Ambulatory Visit (HOSPITAL_COMMUNITY)
Admission: RE | Admit: 2020-01-25 | Discharge: 2020-01-25 | Disposition: A | Discharge status: Home / Self Care | Payer: Medicare Other | Source: Ambulatory Visit | Attending: Vascular Surgery | Admitting: Vascular Surgery

## 2020-01-25 ENCOUNTER — Encounter (HOSPITAL_COMMUNITY)
Admission: RE | Admit: 2020-01-25 | Discharge: 2020-01-25 | Disposition: A | Discharge status: Home / Self Care | Payer: Medicare Other | Source: Ambulatory Visit | Attending: Nephrology | Admitting: Nephrology

## 2020-01-25 VITALS — BP 91/47 | HR 65 | Temp 96.5°F | Resp 18 | Ht 72.0 in | Wt 270.0 lb

## 2020-01-25 DIAGNOSIS — E1122 Type 2 diabetes mellitus with diabetic chronic kidney disease: Secondary | ICD-10-CM

## 2020-01-25 DIAGNOSIS — I4891 Unspecified atrial fibrillation: Secondary | ICD-10-CM | POA: Diagnosis not present

## 2020-01-25 DIAGNOSIS — E1165 Type 2 diabetes mellitus with hyperglycemia: Secondary | ICD-10-CM | POA: Diagnosis not present

## 2020-01-25 DIAGNOSIS — I96 Gangrene, not elsewhere classified: Secondary | ICD-10-CM | POA: Diagnosis not present

## 2020-01-25 DIAGNOSIS — M1990 Unspecified osteoarthritis, unspecified site: Secondary | ICD-10-CM | POA: Diagnosis present

## 2020-01-25 DIAGNOSIS — D631 Anemia in chronic kidney disease: Secondary | ICD-10-CM | POA: Diagnosis not present

## 2020-01-25 DIAGNOSIS — M869 Osteomyelitis, unspecified: Secondary | ICD-10-CM | POA: Diagnosis not present

## 2020-01-25 DIAGNOSIS — T8189XA Other complications of procedures, not elsewhere classified, initial encounter: Secondary | ICD-10-CM | POA: Diagnosis not present

## 2020-01-25 DIAGNOSIS — Z7902 Long term (current) use of antithrombotics/antiplatelets: Secondary | ICD-10-CM | POA: Diagnosis not present

## 2020-01-25 DIAGNOSIS — Z950 Presence of cardiac pacemaker: Secondary | ICD-10-CM | POA: Diagnosis not present

## 2020-01-25 DIAGNOSIS — I70209 Unspecified atherosclerosis of native arteries of extremities, unspecified extremity: Secondary | ICD-10-CM | POA: Diagnosis not present

## 2020-01-25 DIAGNOSIS — E785 Hyperlipidemia, unspecified: Secondary | ICD-10-CM | POA: Diagnosis not present

## 2020-01-25 DIAGNOSIS — Z833 Family history of diabetes mellitus: Secondary | ICD-10-CM | POA: Diagnosis not present

## 2020-01-25 DIAGNOSIS — I129 Hypertensive chronic kidney disease with stage 1 through stage 4 chronic kidney disease, or unspecified chronic kidney disease: Secondary | ICD-10-CM | POA: Diagnosis not present

## 2020-01-25 DIAGNOSIS — N183 Chronic kidney disease, stage 3 unspecified: Secondary | ICD-10-CM

## 2020-01-25 DIAGNOSIS — Z79899 Other long term (current) drug therapy: Secondary | ICD-10-CM | POA: Diagnosis not present

## 2020-01-25 DIAGNOSIS — Z87891 Personal history of nicotine dependence: Secondary | ICD-10-CM | POA: Diagnosis not present

## 2020-01-25 DIAGNOSIS — Z794 Long term (current) use of insulin: Secondary | ICD-10-CM | POA: Diagnosis not present

## 2020-01-25 DIAGNOSIS — E1151 Type 2 diabetes mellitus with diabetic peripheral angiopathy without gangrene: Secondary | ICD-10-CM | POA: Diagnosis not present

## 2020-01-25 DIAGNOSIS — L97419 Non-pressure chronic ulcer of right heel and midfoot with unspecified severity: Secondary | ICD-10-CM | POA: Diagnosis not present

## 2020-01-25 DIAGNOSIS — I2510 Atherosclerotic heart disease of native coronary artery without angina pectoris: Secondary | ICD-10-CM | POA: Diagnosis not present

## 2020-01-25 DIAGNOSIS — Z7982 Long term (current) use of aspirin: Secondary | ICD-10-CM | POA: Diagnosis not present

## 2020-01-25 DIAGNOSIS — I70234 Atherosclerosis of native arteries of right leg with ulceration of heel and midfoot: Secondary | ICD-10-CM | POA: Diagnosis not present

## 2020-01-25 DIAGNOSIS — N189 Chronic kidney disease, unspecified: Secondary | ICD-10-CM | POA: Diagnosis not present

## 2020-01-25 DIAGNOSIS — I7025 Atherosclerosis of native arteries of other extremities with ulceration: Secondary | ICD-10-CM | POA: Diagnosis not present

## 2020-01-25 DIAGNOSIS — E1169 Type 2 diabetes mellitus with other specified complication: Secondary | ICD-10-CM | POA: Diagnosis not present

## 2020-01-25 DIAGNOSIS — Z20822 Contact with and (suspected) exposure to covid-19: Secondary | ICD-10-CM | POA: Diagnosis not present

## 2020-01-25 LAB — SARS CORONAVIRUS 2 (TAT 6-24 HRS): SARS Coronavirus 2: NEGATIVE

## 2020-01-25 MED ORDER — DARBEPOETIN ALFA 200 MCG/0.4ML IJ SOSY
PREFILLED_SYRINGE | INTRAMUSCULAR | Status: AC
  Administered 2020-01-25: 200 ug via SUBCUTANEOUS
  Filled 2020-01-25: qty 0.4

## 2020-01-25 MED ORDER — SODIUM CHLORIDE 0.9 % IV SOLN
510.0000 mg | Freq: Once | INTRAVENOUS | Status: AC
  Administered 2020-01-25: 510 mg via INTRAVENOUS
  Filled 2020-01-25: qty 510

## 2020-01-25 MED ORDER — DARBEPOETIN ALFA 200 MCG/0.4ML IJ SOSY: 200.0000 ug | PREFILLED_SYRINGE | INTRAMUSCULAR | Status: DC

## 2020-01-25 MED ORDER — VANCOMYCIN HCL 1500 MG/300ML IV SOLN
1500.0000 mg | INTRAVENOUS | Status: AC
  Administered 2020-01-26: 1500 mg via INTRAVENOUS
  Filled 2020-01-25: qty 300

## 2020-01-25 NOTE — Anesthesia Preprocedure Evaluation (Addendum)
Anesthesia Evaluation  Patient identified by MRN, date of birth, ID band Patient awake    Reviewed: Allergy & Precautions, NPO status , Patient's Chart, lab work & pertinent test results, reviewed documented beta blocker date and time   Airway Mallampati: II  TM Distance: >3 FB Neck ROM: Full    Dental  (+) Dental Advisory Given, Poor Dentition, Missing   Pulmonary former smoker,    Pulmonary exam normal breath sounds clear to auscultation       Cardiovascular hypertension, Pt. on home beta blockers and Pt. on medications (-) angina+ CAD, + Cardiac Stents and + Peripheral Vascular Disease (PERIPHERAL ARTERY DISEASE WITH LEFT FOOT WOUND)  (-) Past MI + dysrhythmias Atrial Fibrillation + pacemaker  Rhythm:Irregular Rate:Normal     Neuro/Psych negative neurological ROS     GI/Hepatic negative GI ROS, (+)     substance abuse  alcohol use,   Endo/Other  diabetes, Type 2, Insulin DependentObesity   Renal/GU Renal InsufficiencyRenal disease     Musculoskeletal  (+) Arthritis ,   Abdominal   Peds  Hematology  (+) Blood dyscrasia (Eliquis), ,   Anesthesia Other Findings Day of surgery medications reviewed with the patient.  Reproductive/Obstetrics                            Anesthesia Physical Anesthesia Plan  ASA: III  Anesthesia Plan: MAC   Post-op Pain Management:    Induction: Intravenous  PONV Risk Score and Plan: 1 and Propofol infusion  Airway Management Planned: Natural Airway and Nasal Cannula  Additional Equipment:   Intra-op Plan:   Post-operative Plan:   Informed Consent: I have reviewed the patients History and Physical, chart, labs and discussed the procedure including the risks, benefits and alternatives for the proposed anesthesia with the patient or authorized representative who has indicated his/her understanding and acceptance.     Dental advisory given  Plan  Discussed with: CRNA  Anesthesia Plan Comments:        Anesthesia Quick Evaluation

## 2020-01-25 NOTE — Progress Notes (Addendum)
Mr. Bassford denies chest pain or shortness of breath. Patient tested negative for Covid on 3/12 and has been in quarantine with family. Mr Pannone has type II diabetes, patient reports that CBG run in the 200's. I instructed patient to take 31 units of 70/30 Insulin tonight and no Insulin in am.  I instructed patient to check CBG after awaking and every 2 hours until arrival  to the hospital.  I Instructed patient if CBG is less than 70 to drink Gr 1/2 cup of a clear juice. Recheck CBG in 15 minutes then call pre- op desk at [phone] for further instructions.   Mr. Hodgman reported that his HgB was 7.3 today in Medicial Day, it was 7.0.on 01/11/2020. I sent Dr. Scot Dock a staff message.

## 2020-01-26 ENCOUNTER — Encounter (HOSPITAL_COMMUNITY): Payer: Self-pay | Admitting: Vascular Surgery

## 2020-01-26 ENCOUNTER — Ambulatory Visit (HOSPITAL_COMMUNITY): Payer: Medicare Other | Admitting: Anesthesiology

## 2020-01-26 ENCOUNTER — Encounter (HOSPITAL_COMMUNITY)
Admission: RE | Disposition: A | Discharge status: Home / Self Care | Payer: Self-pay | Source: Home / Self Care | Attending: Vascular Surgery

## 2020-01-26 ENCOUNTER — Inpatient Hospital Stay (HOSPITAL_COMMUNITY)
Admission: RE | Admit: 2020-01-26 | Discharge: 2020-01-28 | DRG: 264 | Disposition: A | Discharge status: Home / Self Care | Payer: Medicare Other | Attending: Vascular Surgery | Admitting: Vascular Surgery

## 2020-01-26 DIAGNOSIS — E1151 Type 2 diabetes mellitus with diabetic peripheral angiopathy without gangrene: Principal | ICD-10-CM | POA: Diagnosis present

## 2020-01-26 DIAGNOSIS — D631 Anemia in chronic kidney disease: Secondary | ICD-10-CM | POA: Diagnosis present

## 2020-01-26 DIAGNOSIS — L97419 Non-pressure chronic ulcer of right heel and midfoot with unspecified severity: Secondary | ICD-10-CM | POA: Diagnosis present

## 2020-01-26 DIAGNOSIS — I129 Hypertensive chronic kidney disease with stage 1 through stage 4 chronic kidney disease, or unspecified chronic kidney disease: Secondary | ICD-10-CM | POA: Diagnosis present

## 2020-01-26 DIAGNOSIS — Z841 Family history of disorders of kidney and ureter: Secondary | ICD-10-CM

## 2020-01-26 DIAGNOSIS — L98499 Non-pressure chronic ulcer of skin of other sites with unspecified severity: Secondary | ICD-10-CM | POA: Diagnosis present

## 2020-01-26 DIAGNOSIS — E785 Hyperlipidemia, unspecified: Secondary | ICD-10-CM | POA: Diagnosis present

## 2020-01-26 DIAGNOSIS — Z833 Family history of diabetes mellitus: Secondary | ICD-10-CM

## 2020-01-26 DIAGNOSIS — Z79899 Other long term (current) drug therapy: Secondary | ICD-10-CM | POA: Diagnosis not present

## 2020-01-26 DIAGNOSIS — Z950 Presence of cardiac pacemaker: Secondary | ICD-10-CM | POA: Diagnosis not present

## 2020-01-26 DIAGNOSIS — E1122 Type 2 diabetes mellitus with diabetic chronic kidney disease: Secondary | ICD-10-CM | POA: Diagnosis not present

## 2020-01-26 DIAGNOSIS — E1165 Type 2 diabetes mellitus with hyperglycemia: Secondary | ICD-10-CM | POA: Diagnosis present

## 2020-01-26 DIAGNOSIS — Z8249 Family history of ischemic heart disease and other diseases of the circulatory system: Secondary | ICD-10-CM

## 2020-01-26 DIAGNOSIS — Z7982 Long term (current) use of aspirin: Secondary | ICD-10-CM

## 2020-01-26 DIAGNOSIS — Z20822 Contact with and (suspected) exposure to covid-19: Secondary | ICD-10-CM | POA: Diagnosis present

## 2020-01-26 DIAGNOSIS — Z881 Allergy status to other antibiotic agents status: Secondary | ICD-10-CM

## 2020-01-26 DIAGNOSIS — I4891 Unspecified atrial fibrillation: Secondary | ICD-10-CM | POA: Diagnosis present

## 2020-01-26 DIAGNOSIS — I2510 Atherosclerotic heart disease of native coronary artery without angina pectoris: Secondary | ICD-10-CM | POA: Diagnosis present

## 2020-01-26 DIAGNOSIS — Z87891 Personal history of nicotine dependence: Secondary | ICD-10-CM

## 2020-01-26 DIAGNOSIS — I495 Sick sinus syndrome: Secondary | ICD-10-CM

## 2020-01-26 DIAGNOSIS — M1990 Unspecified osteoarthritis, unspecified site: Secondary | ICD-10-CM | POA: Diagnosis present

## 2020-01-26 DIAGNOSIS — M869 Osteomyelitis, unspecified: Secondary | ICD-10-CM | POA: Diagnosis not present

## 2020-01-26 DIAGNOSIS — Z888 Allergy status to other drugs, medicaments and biological substances status: Secondary | ICD-10-CM

## 2020-01-26 DIAGNOSIS — N189 Chronic kidney disease, unspecified: Secondary | ICD-10-CM | POA: Diagnosis present

## 2020-01-26 DIAGNOSIS — Z794 Long term (current) use of insulin: Secondary | ICD-10-CM

## 2020-01-26 DIAGNOSIS — Z882 Allergy status to sulfonamides status: Secondary | ICD-10-CM

## 2020-01-26 DIAGNOSIS — Z7902 Long term (current) use of antithrombotics/antiplatelets: Secondary | ICD-10-CM | POA: Diagnosis not present

## 2020-01-26 DIAGNOSIS — I7025 Atherosclerosis of native arteries of other extremities with ulceration: Secondary | ICD-10-CM | POA: Diagnosis present

## 2020-01-26 DIAGNOSIS — N183 Chronic kidney disease, stage 3 unspecified: Secondary | ICD-10-CM | POA: Diagnosis not present

## 2020-01-26 DIAGNOSIS — E1169 Type 2 diabetes mellitus with other specified complication: Secondary | ICD-10-CM | POA: Diagnosis present

## 2020-01-26 DIAGNOSIS — I96 Gangrene, not elsewhere classified: Secondary | ICD-10-CM | POA: Diagnosis not present

## 2020-01-26 DIAGNOSIS — I70209 Unspecified atherosclerosis of native arteries of extremities, unspecified extremity: Secondary | ICD-10-CM | POA: Diagnosis present

## 2020-01-26 DIAGNOSIS — I70234 Atherosclerosis of native arteries of right leg with ulceration of heel and midfoot: Secondary | ICD-10-CM | POA: Diagnosis present

## 2020-01-26 HISTORY — PX: I & D EXTREMITY: SHX5045

## 2020-01-26 HISTORY — DX: Personal history of other medical treatment: Z92.89

## 2020-01-26 LAB — CBC
HCT: 22.1 % — ABNORMAL LOW (ref 39.0–52.0)
Hemoglobin: 6.9 g/dL — CL (ref 13.0–17.0)
MCH: 27.9 pg (ref 26.0–34.0)
MCHC: 31.2 g/dL (ref 30.0–36.0)
MCV: 89.5 fL (ref 80.0–100.0)
Platelets: 242 10*3/uL (ref 150–400)
RBC: 2.47 MIL/uL — ABNORMAL LOW (ref 4.22–5.81)
RDW: 14.9 % (ref 11.5–15.5)
WBC: 7.4 10*3/uL (ref 4.0–10.5)
nRBC: 0 % (ref 0.0–0.2)

## 2020-01-26 LAB — COMPREHENSIVE METABOLIC PANEL
ALT: 18 U/L (ref 0–44)
AST: 13 U/L — ABNORMAL LOW (ref 15–41)
Albumin: 2.9 g/dL — ABNORMAL LOW (ref 3.5–5.0)
Alkaline Phosphatase: 70 U/L (ref 38–126)
Anion gap: 12 (ref 5–15)
BUN: 93 mg/dL — ABNORMAL HIGH (ref 8–23)
CO2: 18 mmol/L — ABNORMAL LOW (ref 22–32)
Calcium: 8.8 mg/dL — ABNORMAL LOW (ref 8.9–10.3)
Chloride: 106 mmol/L (ref 98–111)
Creatinine, Ser: 2.86 mg/dL — ABNORMAL HIGH (ref 0.61–1.24)
GFR calc Af Amer: 26 mL/min — ABNORMAL LOW (ref >60)
GFR calc non Af Amer: 23 mL/min — ABNORMAL LOW (ref >60)
Glucose, Bld: 300 mg/dL — ABNORMAL HIGH (ref 70–99)
Potassium: 4.9 mmol/L (ref 3.5–5.1)
Sodium: 136 mmol/L (ref 135–145)
Total Bilirubin: 0.7 mg/dL (ref 0.3–1.2)
Total Protein: 6.8 g/dL (ref 6.5–8.1)

## 2020-01-26 LAB — PREPARE RBC (CROSSMATCH)

## 2020-01-26 LAB — GLUCOSE, CAPILLARY
Glucose-Capillary: 278 mg/dL — ABNORMAL HIGH (ref 70–99)
Glucose-Capillary: 286 mg/dL — ABNORMAL HIGH (ref 70–99)
Glucose-Capillary: 300 mg/dL — ABNORMAL HIGH (ref 70–99)
Glucose-Capillary: 336 mg/dL — ABNORMAL HIGH (ref 70–99)
Glucose-Capillary: 465 mg/dL — ABNORMAL HIGH (ref 70–99)

## 2020-01-26 LAB — POCT HEMOGLOBIN-HEMACUE: Hemoglobin: 7.3 g/dL — ABNORMAL LOW (ref 13.0–17.0)

## 2020-01-26 LAB — PROTIME-INR
INR: 1.2 (ref 0.8–1.2)
Prothrombin Time: 15.4 seconds — ABNORMAL HIGH (ref 11.4–15.2)

## 2020-01-26 SURGERY — IRRIGATION AND DEBRIDEMENT EXTREMITY
Anesthesia: General | Site: Foot | Laterality: Right

## 2020-01-26 MED ORDER — CHLORHEXIDINE GLUCONATE CLOTH 2 % EX PADS: 6.0000 | MEDICATED_PAD | Freq: Once | CUTANEOUS | Status: DC

## 2020-01-26 MED ORDER — INSULIN ASPART 100 UNIT/ML ~~LOC~~ SOLN
SUBCUTANEOUS | Status: AC
  Filled 2020-01-26: qty 1

## 2020-01-26 MED ORDER — HYDROMORPHONE HCL 1 MG/ML IJ SOLN
0.5000 mg | INTRAMUSCULAR | Status: DC | PRN
  Administered 2020-01-27: 1 mg via INTRAVENOUS
  Filled 2020-01-26: qty 1

## 2020-01-26 MED ORDER — LISINOPRIL 10 MG PO TABS
10.0000 mg | ORAL_TABLET | Freq: Every day | ORAL | Status: DC
  Administered 2020-01-26 – 2020-01-28 (×3): 10 mg via ORAL
  Filled 2020-01-26 (×3): qty 1

## 2020-01-26 MED ORDER — 0.9 % SODIUM CHLORIDE (POUR BTL) OPTIME
TOPICAL | Status: DC | PRN
  Administered 2020-01-26: 1000 mL

## 2020-01-26 MED ORDER — ALUM & MAG HYDROXIDE-SIMETH 200-200-20 MG/5ML PO SUSP: 15.0000 mL | ORAL | Status: DC | PRN

## 2020-01-26 MED ORDER — PHENOL 1.4 % MT LIQD: 1.0000 | OROMUCOSAL | Status: DC | PRN

## 2020-01-26 MED ORDER — INSULIN ASPART 100 UNIT/ML ~~LOC~~ SOLN
0.0000 [IU] | Freq: Every day | SUBCUTANEOUS | Status: DC
  Administered 2020-01-26: 5 [IU] via SUBCUTANEOUS

## 2020-01-26 MED ORDER — PROPOFOL 10 MG/ML IV BOLUS
INTRAVENOUS | Status: AC
  Filled 2020-01-26: qty 20

## 2020-01-26 MED ORDER — ASPIRIN EC 81 MG PO TBEC
81.0000 mg | DELAYED_RELEASE_TABLET | Freq: Every day | ORAL | Status: DC
  Administered 2020-01-27 – 2020-01-28 (×2): 81 mg via ORAL
  Filled 2020-01-26 (×2): qty 1

## 2020-01-26 MED ORDER — POTASSIUM CHLORIDE CRYS ER 20 MEQ PO TBCR: 20.0000 meq | EXTENDED_RELEASE_TABLET | Freq: Once | ORAL | Status: DC

## 2020-01-26 MED ORDER — LIDOCAINE 2% (20 MG/ML) 5 ML SYRINGE
INTRAMUSCULAR | Status: AC
  Filled 2020-01-26: qty 5

## 2020-01-26 MED ORDER — DEXAMETHASONE SODIUM PHOSPHATE 10 MG/ML IJ SOLN
INTRAMUSCULAR | Status: DC | PRN
  Administered 2020-01-26: 5 mg via INTRAVENOUS

## 2020-01-26 MED ORDER — SODIUM CHLORIDE 0.9% IV SOLUTION: Freq: Once | INTRAVENOUS | Status: DC

## 2020-01-26 MED ORDER — ADULT MULTIVITAMIN W/MINERALS CH
1.0000 | ORAL_TABLET | Freq: Every day | ORAL | Status: DC
  Administered 2020-01-26 – 2020-01-28 (×3): 1 via ORAL
  Filled 2020-01-26 (×3): qty 1

## 2020-01-26 MED ORDER — LIDOCAINE HCL (CARDIAC) PF 100 MG/5ML IV SOSY
PREFILLED_SYRINGE | INTRAVENOUS | Status: DC | PRN
  Administered 2020-01-26: 60 mg via INTRATRACHEAL

## 2020-01-26 MED ORDER — ONDANSETRON HCL 4 MG/2ML IJ SOLN: 4.0000 mg | Freq: Four times a day (QID) | INTRAMUSCULAR | Status: DC | PRN

## 2020-01-26 MED ORDER — ONDANSETRON HCL 4 MG/2ML IJ SOLN
INTRAMUSCULAR | Status: AC
  Filled 2020-01-26: qty 2

## 2020-01-26 MED ORDER — INSULIN ASPART 100 UNIT/ML ~~LOC~~ SOLN
0.0000 [IU] | Freq: Three times a day (TID) | SUBCUTANEOUS | Status: DC
  Administered 2020-01-27: 15 [IU] via SUBCUTANEOUS

## 2020-01-26 MED ORDER — EPHEDRINE SULFATE 50 MG/ML IJ SOLN
INTRAMUSCULAR | Status: DC | PRN
  Administered 2020-01-26: 5 mg via INTRAVENOUS

## 2020-01-26 MED ORDER — FENTANYL CITRATE (PF) 250 MCG/5ML IJ SOLN
INTRAMUSCULAR | Status: AC
  Filled 2020-01-26: qty 5

## 2020-01-26 MED ORDER — OXYCODONE-ACETAMINOPHEN 5-325 MG PO TABS
1.0000 | ORAL_TABLET | ORAL | Status: DC | PRN
  Administered 2020-01-26 (×2): 2 via ORAL
  Administered 2020-01-27: 1 via ORAL
  Filled 2020-01-26: qty 2
  Filled 2020-01-26: qty 1

## 2020-01-26 MED ORDER — FUROSEMIDE 40 MG PO TABS
40.0000 mg | ORAL_TABLET | Freq: Two times a day (BID) | ORAL | Status: DC
  Administered 2020-01-26 – 2020-01-28 (×4): 40 mg via ORAL
  Filled 2020-01-26 (×4): qty 1

## 2020-01-26 MED ORDER — FENTANYL CITRATE (PF) 100 MCG/2ML IJ SOLN
INTRAMUSCULAR | Status: AC
  Filled 2020-01-26: qty 2

## 2020-01-26 MED ORDER — PHENYLEPHRINE HCL (PRESSORS) 10 MG/ML IV SOLN
INTRAVENOUS | Status: DC | PRN
  Administered 2020-01-26: 25 ug via INTRAVENOUS
  Administered 2020-01-26: 55 ug via INTRAVENOUS

## 2020-01-26 MED ORDER — PHENYLEPHRINE HCL-NACL 10-0.9 MG/250ML-% IV SOLN
INTRAVENOUS | Status: DC | PRN
  Administered 2020-01-26: 25 ug/min via INTRAVENOUS

## 2020-01-26 MED ORDER — METOPROLOL TARTRATE 5 MG/5ML IV SOLN: 2.0000 mg | INTRAVENOUS | Status: DC | PRN

## 2020-01-26 MED ORDER — EPHEDRINE 5 MG/ML INJ
INTRAVENOUS | Status: AC
  Filled 2020-01-26: qty 10

## 2020-01-26 MED ORDER — METOPROLOL TARTRATE 100 MG PO TABS
100.0000 mg | ORAL_TABLET | Freq: Two times a day (BID) | ORAL | Status: DC
  Administered 2020-01-26 – 2020-01-28 (×4): 100 mg via ORAL
  Filled 2020-01-26 (×4): qty 1

## 2020-01-26 MED ORDER — GUAIFENESIN-DM 100-10 MG/5ML PO SYRP: 15.0000 mL | ORAL_SOLUTION | ORAL | Status: DC | PRN

## 2020-01-26 MED ORDER — PANTOPRAZOLE SODIUM 40 MG PO TBEC
40.0000 mg | DELAYED_RELEASE_TABLET | Freq: Every day | ORAL | Status: DC
  Administered 2020-01-26 – 2020-01-28 (×3): 40 mg via ORAL
  Filled 2020-01-26 (×3): qty 1

## 2020-01-26 MED ORDER — LABETALOL HCL 5 MG/ML IV SOLN: 10.0000 mg | INTRAVENOUS | Status: DC | PRN

## 2020-01-26 MED ORDER — ONDANSETRON HCL 4 MG/2ML IJ SOLN: 4.0000 mg | Freq: Once | INTRAMUSCULAR | Status: DC | PRN

## 2020-01-26 MED ORDER — ACETAMINOPHEN 500 MG PO TABS
1000.0000 mg | ORAL_TABLET | Freq: Once | ORAL | Status: AC
  Administered 2020-01-26: 1000 mg via ORAL
  Filled 2020-01-26: qty 2

## 2020-01-26 MED ORDER — INSULIN ASPART 100 UNIT/ML ~~LOC~~ SOLN
4.0000 [IU] | Freq: Three times a day (TID) | SUBCUTANEOUS | Status: DC
  Administered 2020-01-27: 4 [IU] via SUBCUTANEOUS

## 2020-01-26 MED ORDER — INSULIN ASPART 100 UNIT/ML ~~LOC~~ SOLN
5.0000 [IU] | Freq: Once | SUBCUTANEOUS | Status: AC
  Administered 2020-01-26: 5 [IU] via SUBCUTANEOUS

## 2020-01-26 MED ORDER — PHENYLEPHRINE 40 MCG/ML (10ML) SYRINGE FOR IV PUSH (FOR BLOOD PRESSURE SUPPORT)
PREFILLED_SYRINGE | INTRAVENOUS | Status: AC
  Filled 2020-01-26: qty 10

## 2020-01-26 MED ORDER — SODIUM CHLORIDE 0.9 % IV SOLN: INTRAVENOUS | Status: DC

## 2020-01-26 MED ORDER — INSULIN ASPART 100 UNIT/ML ~~LOC~~ SOLN
0.0000 [IU] | Freq: Three times a day (TID) | SUBCUTANEOUS | Status: DC
  Administered 2020-01-26: 8 [IU] via SUBCUTANEOUS
  Administered 2020-01-26: 11 [IU] via SUBCUTANEOUS

## 2020-01-26 MED ORDER — HYDRALAZINE HCL 50 MG PO TABS
100.0000 mg | ORAL_TABLET | Freq: Three times a day (TID) | ORAL | Status: DC
  Administered 2020-01-26 – 2020-01-28 (×6): 100 mg via ORAL
  Filled 2020-01-26 (×6): qty 2

## 2020-01-26 MED ORDER — FENTANYL CITRATE (PF) 250 MCG/5ML IJ SOLN
INTRAMUSCULAR | Status: DC | PRN
  Administered 2020-01-26 (×2): 25 ug via INTRAVENOUS

## 2020-01-26 MED ORDER — PRAZOSIN HCL 2 MG PO CAPS
2.0000 mg | ORAL_CAPSULE | Freq: Every day | ORAL | Status: DC
  Administered 2020-01-26 – 2020-01-28 (×3): 2 mg via ORAL
  Filled 2020-01-26 (×3): qty 1

## 2020-01-26 MED ORDER — PROPOFOL 10 MG/ML IV BOLUS
INTRAVENOUS | Status: DC | PRN
  Administered 2020-01-26: 50 mg via INTRAVENOUS

## 2020-01-26 MED ORDER — PROPOFOL 500 MG/50ML IV EMUL
INTRAVENOUS | Status: DC | PRN
  Administered 2020-01-26: 75 ug/kg/min via INTRAVENOUS

## 2020-01-26 MED ORDER — FENTANYL CITRATE (PF) 100 MCG/2ML IJ SOLN
25.0000 ug | INTRAMUSCULAR | Status: DC | PRN
  Administered 2020-01-26: 50 ug via INTRAVENOUS

## 2020-01-26 MED ORDER — DEXAMETHASONE SODIUM PHOSPHATE 10 MG/ML IJ SOLN
INTRAMUSCULAR | Status: AC
  Filled 2020-01-26: qty 1

## 2020-01-26 MED ORDER — MIDAZOLAM HCL 2 MG/2ML IJ SOLN
INTRAMUSCULAR | Status: AC
  Filled 2020-01-26: qty 2

## 2020-01-26 MED ORDER — VANCOMYCIN HCL 1750 MG/350ML IV SOLN
1750.0000 mg | INTRAVENOUS | Status: DC
  Administered 2020-01-28: 1750 mg via INTRAVENOUS
  Filled 2020-01-26: qty 350

## 2020-01-26 MED ORDER — ONDANSETRON HCL 4 MG/2ML IJ SOLN
INTRAMUSCULAR | Status: DC | PRN
  Administered 2020-01-26: 4 mg via INTRAVENOUS

## 2020-01-26 MED ORDER — OXYCODONE-ACETAMINOPHEN 5-325 MG PO TABS
ORAL_TABLET | ORAL | Status: AC
  Filled 2020-01-26: qty 2

## 2020-01-26 MED ORDER — APIXABAN 5 MG PO TABS
5.0000 mg | ORAL_TABLET | Freq: Two times a day (BID) | ORAL | Status: DC
  Administered 2020-01-26 – 2020-01-28 (×4): 5 mg via ORAL
  Filled 2020-01-26 (×4): qty 1

## 2020-01-26 MED ORDER — MIDAZOLAM HCL 2 MG/2ML IJ SOLN
INTRAMUSCULAR | Status: DC | PRN
  Administered 2020-01-26: 2 mg via INTRAVENOUS

## 2020-01-26 MED ORDER — HEPARIN SODIUM (PORCINE) 5000 UNIT/ML IJ SOLN: 5000.0000 [IU] | Freq: Three times a day (TID) | INTRAMUSCULAR | Status: DC

## 2020-01-26 MED ORDER — ATORVASTATIN CALCIUM 10 MG PO TABS
20.0000 mg | ORAL_TABLET | Freq: Every day | ORAL | Status: DC
  Administered 2020-01-26 – 2020-01-28 (×3): 20 mg via ORAL
  Filled 2020-01-26 (×3): qty 2

## 2020-01-26 MED ORDER — HYDRALAZINE HCL 20 MG/ML IJ SOLN: 5.0000 mg | INTRAMUSCULAR | Status: DC | PRN

## 2020-01-26 SURGICAL SUPPLY — 46 items
BLADE LONG MED 31MMX9MM (MISCELLANEOUS) ×1
BLADE LONG MED 31X9 (MISCELLANEOUS) ×1 IMPLANT
BLADE SURG 15 STRL LF DISP TIS (BLADE) IMPLANT
BLADE SURG 15 STRL SS (BLADE) ×3
BNDG ELASTIC 4X5.8 VLCR STR LF (GAUZE/BANDAGES/DRESSINGS) ×2 IMPLANT
BNDG ELASTIC 6X5.8 VLCR STR LF (GAUZE/BANDAGES/DRESSINGS) IMPLANT
BNDG GAUZE ELAST 4 BULKY (GAUZE/BANDAGES/DRESSINGS) ×2 IMPLANT
CANISTER SUCT 3000ML PPV (MISCELLANEOUS) ×3 IMPLANT
CNTNR URN SCR LID CUP LEK RST (MISCELLANEOUS) IMPLANT
CONT SPEC 4OZ STRL OR WHT (MISCELLANEOUS) ×3
COVER SURGICAL LIGHT HANDLE (MISCELLANEOUS) ×3 IMPLANT
COVER WAND RF STERILE (DRAPES) ×3 IMPLANT
DRAPE EXTREMITY ABC'S (DRAPES) ×1
DRAPE EXTREMITY ABCS (DRAPES) ×1 IMPLANT
DRAPE HALF SHEET 40X57 (DRAPES) ×3 IMPLANT
DRAPE INCISE IOBAN 66X45 STRL (DRAPES) ×3 IMPLANT
DRAPE ORTHO SPLIT 77X108 STRL (DRAPES) ×6
DRAPE SURG ORHT 6 SPLT 77X108 (DRAPES) ×2 IMPLANT
DRSG VAC ATS SM SENSATRAC (GAUZE/BANDAGES/DRESSINGS) ×2 IMPLANT
ELECT REM PT RETURN 9FT ADLT (ELECTROSURGICAL) ×3
ELECTRODE REM PT RTRN 9FT ADLT (ELECTROSURGICAL) ×1 IMPLANT
GAUZE SPONGE 2X2 8PLY STRL LF (GAUZE/BANDAGES/DRESSINGS) IMPLANT
GAUZE SPONGE 4X4 12PLY STRL (GAUZE/BANDAGES/DRESSINGS) ×3 IMPLANT
GLOVE BIO SURGEON STRL SZ7.5 (GLOVE) ×3 IMPLANT
GLOVE BIOGEL PI IND STRL 8 (GLOVE) ×1 IMPLANT
GLOVE BIOGEL PI INDICATOR 8 (GLOVE) ×2
GOWN STRL REUS W/ TWL LRG LVL3 (GOWN DISPOSABLE) ×3 IMPLANT
GOWN STRL REUS W/TWL LRG LVL3 (GOWN DISPOSABLE) ×9
KIT BASIN OR (CUSTOM PROCEDURE TRAY) ×3 IMPLANT
KIT TURNOVER KIT B (KITS) ×3 IMPLANT
NDL HYPO 25GX1X1/2 BEV (NEEDLE) IMPLANT
NEEDLE HYPO 25GX1X1/2 BEV (NEEDLE) ×3 IMPLANT
NS IRRIG 1000ML POUR BTL (IV SOLUTION) ×3 IMPLANT
PACK CV ACCESS (CUSTOM PROCEDURE TRAY) IMPLANT
PACK GENERAL/GYN (CUSTOM PROCEDURE TRAY) IMPLANT
PAD ARMBOARD 7.5X6 YLW CONV (MISCELLANEOUS) ×6 IMPLANT
SPONGE GAUZE 2X2 STER 10/PKG (GAUZE/BANDAGES/DRESSINGS) ×2
SPONGE LAP 18X18 RF (DISPOSABLE) ×2 IMPLANT
SUT ETHILON 3 0 PS 1 (SUTURE) IMPLANT
SUT VIC AB 2-0 CTB1 (SUTURE) IMPLANT
SUT VIC AB 3-0 SH 27 (SUTURE)
SUT VIC AB 3-0 SH 27X BRD (SUTURE) IMPLANT
SUT VICRYL 4-0 PS2 18IN ABS (SUTURE) IMPLANT
SYR CONTROL 10ML LL (SYRINGE) ×2 IMPLANT
TOWEL GREEN STERILE (TOWEL DISPOSABLE) ×3 IMPLANT
WATER STERILE IRR 1000ML POUR (IV SOLUTION) ×3 IMPLANT

## 2020-01-26 NOTE — Progress Notes (Signed)
MD ordered emergent blood, 1 unit given in the OR and 2nd unit given in PACU, will consent with wife via phone.

## 2020-01-26 NOTE — Interval H&P Note (Signed)
History and Physical Interval Note:  01/26/2020 9:16 AM  Alexander [name]  has presented today for surgery, with the diagnosis of PERIPHERAL ARTERY WITH LEFT FOOT WOUND.  The various methods of treatment have been discussed with the patient and family. After consideration of risks, benefits and other options for treatment, the patient has consented to  Procedure(s): IRRIGATION AND DEBRIDEMENT EXTREMITY RIGHT FOOT WOUND (Right) as a surgical intervention.  The patient's history has been reviewed, patient examined, no change in status, stable for surgery.  I have reviewed the patient's chart and labs.  Questions were answered to the patient's satisfaction.     [name]

## 2020-01-26 NOTE — Interval H&P Note (Signed)
History and Physical Interval Note:  01/26/2020 9:15 AM  Alexander [name]  has presented today for surgery, with the diagnosis of PERIPHERAL ARTERY WITH LEFT FOOT WOUND.  The various methods of treatment have been discussed with the patient and family. After consideration of risks, benefits and other options for treatment, the patient has consented to  Procedure(s): IRRIGATION AND DEBRIDEMENT EXTREMITY RIGHT FOOT WOUND (Right) as a surgical intervention.  The patient's history has been reviewed, patient examined, no change in status, stable for surgery.  I have reviewed the patient's chart and labs.  Questions were answered to the patient's satisfaction.     [name]

## 2020-01-26 NOTE — Progress Notes (Signed)
PHARMACY NOTE:  ANTIMICROBIAL RENAL DOSAGE ADJUSTMENT  Current antimicrobial regimen includes a mismatch between antimicrobial dosage and estimated renal function.  As per policy approved by the Pharmacy & Therapeutics and Medical Executive Committees, the antimicrobial dosage will be adjusted accordingly.  Current antimicrobial dosage:  Vancomycin 1gm Iv q12h  Indication: Wound infection  Renal Function:  Estimated Creatinine Clearance: 36.7 mL/min (A) (by C-G formula based on SCr of 2.86 mg/dL (H)). []      On intermittent HD, scheduled: []      On CRRT    Antimicrobial dosage has been changed to:  1750mg  IV q48 hours  Additional comments: Patient received 1500mg  IV x 1 at ~0830 on 3/16. CrCl ~36 ml/min. Will adjust vancomycin to 1750mg  IV q48 with next dose due at 0830 on 3/18 to achieve an AUC of 490 (goal 400-550) using a Scr of 2.86.   Please enter pharmacy consult for vancomycin management.    Talayia Hjort A. Levada Dy, PharmD, BCPS, FNKF Clinical Pharmacist [place] Please utilize Amion for appropriate phone number to reach the unit pharmacist ([place])   01/26/2020 5:19 PM

## 2020-01-26 NOTE — Anesthesia Procedure Notes (Signed)
Procedure Name: MAC Date/Time: 01/26/2020 9:45 AM Performed by: Kathryne Hitch, CRNA Pre-anesthesia Checklist: Patient identified, Emergency Drugs available, Suction available and Patient being monitored Patient Re-evaluated:Patient Re-evaluated prior to induction Oxygen Delivery Method: Simple face mask Preoxygenation: Pre-oxygenation with 100% oxygen Induction Type: IV induction

## 2020-01-26 NOTE — Progress Notes (Signed)
CRITICAL VALUE ALERT  Critical Value:  Hgb 6.9  Date & Time Notied:  01/26/20 8209  Provider Notified: Dr. Scot Dock  Orders Received/Actions taken: Provider Notified; Laurita Quint, RN notified.

## 2020-01-26 NOTE — Anesthesia Procedure Notes (Signed)
Procedure Name: LMA Insertion Date/Time: 01/26/2020 10:07 AM Performed by: Kathryne Hitch, CRNA Pre-anesthesia Checklist: Patient identified, Emergency Drugs available, Suction available and Patient being monitored Patient Re-evaluated:Patient Re-evaluated prior to induction Oxygen Delivery Method: Circle system utilized Preoxygenation: Pre-oxygenation with 100% oxygen Induction Type: IV induction Ventilation: Mask ventilation without difficulty LMA: LMA inserted LMA Size: 5.0 Number of attempts: 1 Tube secured with: Tape Dental Injury: Teeth and Oropharynx as per pre-operative assessment

## 2020-01-26 NOTE — Transfer of Care (Signed)
Immediate Anesthesia Transfer of Care Note  Patient: Alexander [name]  Procedure(s) Performed: IRRIGATION AND DEBRIDEMENT EXTREMITY RIGHT FOOT WOUND (Right Foot)  Patient Location: PACU  Anesthesia Type:General  Level of Consciousness: drowsy and patient cooperative  Airway & Oxygen Therapy: Patient Spontanous Breathing  Post-op Assessment: Report given to RN and Post -op Vital signs reviewed and stable  Post vital signs: Reviewed and stable  Last Vitals:  Vitals Value Taken Time  BP 121/49 01/26/20 1058  Temp    Pulse 66 01/26/20 1058  Resp 15 01/26/20 1059  SpO2 94 % 01/26/20 1058  Vitals shown include unvalidated device data.  Last Pain:  Vitals:   01/26/20 0748  PainSc: 0-No pain      Patients Stated Pain Goal: 2 (00/86/76 1950)  Complications: No apparent anesthesia complications

## 2020-01-26 NOTE — Progress Notes (Signed)
Pt arrived from PACU to rm 4E21. Tele initiated, assessment performed, vss, BG checked. Called bell within reach. Bed alarmed initiated.   Lavenia Atlas, RN

## 2020-01-26 NOTE — Op Note (Signed)
    NAME: Alexander [name]    MRN: [id number] DOB: 1960-12-18    DATE OF OPERATION: 01/26/2020  PREOP DIAGNOSIS:    Gangrene right lateral foot  POSTOP DIAGNOSIS:    Same with osteomyelitis right fifth metatarsal  PROCEDURE:    Excisional debridement of lateral wound right foot including bone, skin, subcutaneous tissue, fascia, and adipose tissue. Excision of fifth metatarsal right foot Placement of VAC  SURGEON: Jerre Vandrunen S. Scot Dock, MD  ASSIST: None  ANESTHESIA: General  EBL: Minimal  INDICATIONS:    Alexander [name] is a 59 y.o. male who is undergone previous right femoropopliteal bypass and also tibial interventions.  He presented with a gangrenous wound of the lateral aspect of his right foot which was progressing despite aggressive outpatient care.  He was set up for debridement.      FINDINGS:   There was good bleeding at the site of the debridement.  The fifth metatarsal was clearly infected and this was excised and culture sent of the bone.  TECHNIQUE:   The patient was taken to the operating room and received a general anesthetic.  The right foot was prepped and draped in usual sterile fashion.  I debrided a large amount of necrotic adipose tissue sharply using Metzenbaum scissors.  I then incised skin around the circumference of the wound using a 10 blade.  The dissection was carried down to the fifth metatarsal which was clearly infected and a bone culture was obtained and sent.  I dissected out the metatarsal completely back to the proximal aspect of the wound where the bone was divided using a CD4 saw.  I then excised additional fascia, bone, subcutaneous tissue, and adipose tissue using the rongeur.  Hemostasis was obtained using electrocautery.  The wound measured 1-1/2 cm in depth by 7 cm in length by 5 cm in width.  A VAC dressing was placed with a good seal.  There was a small wound on the medial aspect of the right heel and I debrided this sharply with  Metzenbaum scissors back to healthy tissue.  This did not appear to track deep.  Hemostasis was obtained using electrocautery.  A sterile dressing was applied.  The patient tolerated the procedure well was transferred to the recovery room in stable condition.  All needle and sponge counts were correct.  [name], MD, FACS [facility]  DATE OF DICTATION:   01/26/2020

## 2020-01-27 ENCOUNTER — Other Ambulatory Visit: Payer: Self-pay | Admitting: Cardiology

## 2020-01-27 DIAGNOSIS — I495 Sick sinus syndrome: Secondary | ICD-10-CM

## 2020-01-27 LAB — BPAM RBC
Blood Product Expiration Date: 202104122359
Blood Product Expiration Date: 202104122359
ISSUE DATE / TIME: 202103161029
ISSUE DATE / TIME: 202103161115
Unit Type and Rh: 6200
Unit Type and Rh: 6200

## 2020-01-27 LAB — CBC
HCT: 28 % — ABNORMAL LOW (ref 39.0–52.0)
Hemoglobin: 8.9 g/dL — ABNORMAL LOW (ref 13.0–17.0)
MCH: 28.5 pg (ref 26.0–34.0)
MCHC: 31.8 g/dL (ref 30.0–36.0)
MCV: 89.7 fL (ref 80.0–100.0)
Platelets: 221 10*3/uL (ref 150–400)
RBC: 3.12 MIL/uL — ABNORMAL LOW (ref 4.22–5.81)
RDW: 14.9 % (ref 11.5–15.5)
WBC: 9.1 10*3/uL (ref 4.0–10.5)
nRBC: 0 % (ref 0.0–0.2)

## 2020-01-27 LAB — GLUCOSE, CAPILLARY
Glucose-Capillary: 417 mg/dL — ABNORMAL HIGH (ref 70–99)
Glucose-Capillary: 387 mg/dL — ABNORMAL HIGH (ref 70–99)
Glucose-Capillary: 355 mg/dL — ABNORMAL HIGH (ref 70–99)
Glucose-Capillary: 313 mg/dL — ABNORMAL HIGH (ref 70–99)
Glucose-Capillary: 216 mg/dL — ABNORMAL HIGH (ref 70–99)

## 2020-01-27 LAB — TYPE AND SCREEN
ABO/RH(D): A POS
Antibody Screen: NEGATIVE
Unit division: 0
Unit division: 0

## 2020-01-27 LAB — HEMOGLOBIN A1C
Hgb A1c MFr Bld: 8.6 % — ABNORMAL HIGH (ref 4.8–5.6)
Mean Plasma Glucose: 200.12 mg/dL

## 2020-01-27 LAB — HIV ANTIBODY (ROUTINE TESTING W REFLEX): HIV Screen 4th Generation wRfx: NONREACTIVE

## 2020-01-27 MED ORDER — INSULIN NPH ISOPHANE & REGULAR (70-30) 100 UNIT/ML ~~LOC~~ SUSP: 46.0000 [IU] | SUBCUTANEOUS | Status: DC

## 2020-01-27 MED ORDER — INSULIN ASPART PROT & ASPART (70-30 MIX) 100 UNIT/ML ~~LOC~~ SUSP
46.0000 [IU] | Freq: Every day | SUBCUTANEOUS | Status: DC
  Administered 2020-01-27: 46 [IU] via SUBCUTANEOUS
  Filled 2020-01-27: qty 10

## 2020-01-27 MED ORDER — INSULIN ASPART PROT & ASPART (70-30 MIX) 100 UNIT/ML ~~LOC~~ SUSP
56.0000 [IU] | Freq: Every day | SUBCUTANEOUS | Status: DC
  Administered 2020-01-27: 56 [IU] via SUBCUTANEOUS
  Filled 2020-01-27: qty 10

## 2020-01-27 MED ORDER — INSULIN ASPART 100 UNIT/ML ~~LOC~~ SOLN
0.0000 [IU] | Freq: Three times a day (TID) | SUBCUTANEOUS | Status: DC
  Administered 2020-01-27: 20 [IU] via SUBCUTANEOUS
  Administered 2020-01-27: 15 [IU] via SUBCUTANEOUS
  Administered 2020-01-28: 7 [IU] via SUBCUTANEOUS
  Administered 2020-01-28: 4 [IU] via SUBCUTANEOUS

## 2020-01-27 MED ORDER — INSULIN ASPART 100 UNIT/ML ~~LOC~~ SOLN
0.0000 [IU] | Freq: Every day | SUBCUTANEOUS | Status: DC
  Administered 2020-01-27: 2 [IU] via SUBCUTANEOUS

## 2020-01-27 NOTE — Progress Notes (Signed)
Orthopedic Tech Progress Note Patient Details:  Alexander [name] Apr 02, 1961 [id number] Applied by Liane Comber. RN called requesting a [place] for patient Patient ID: Alexander [name], male   DOB: 1961-03-18, 59 y.o.   MRN: [id number]   [name] 01/27/2020, 9:22 AM

## 2020-01-27 NOTE — Consult Note (Addendum)
    [facility] CM Inpatient Consult   01/27/2020  Alexander [name] Apr 17, 1961 [id number]   [place] Management referral:  We have reviewed your referral request and defer the request for wound VAC to inpatient Transition of Care team.of referral to [facility]. Notified inpatient TOC RNCM.  However, chart reviewed briefly for post hospital traniition of care need and patient could benefit from [facility] follow up for diabetes management, Hgb A1C 8.6,  will follow up with inpatient [name] staff.  Inpatient [facility] RNCM aware of needs for VAC.  1230 pm: Call to patient's room, HIPAA verified.  Explained to patient about [place] [facility] for meeting diabetes management goals/needs.  Patient states he is suppose to have a nurse or 'somebody' to come out and felt that would be sufficient for now,  Will mail information to patient's address as verified with HIPAA assessment per request for information.  Patient currently declines post hospital follow up for [place] [facility].  Patient in [place] Medicare with Dr. Domenick Gong with [facility] provider.  That office provides the transition of care follow up.  For questions, please contact:  [name], RN BSN [facility] Liaison  [phone] business mobile phone Toll free office [phone]  Fax number: [phone] [email or web address]

## 2020-01-27 NOTE — Progress Notes (Signed)
   VASCULAR SURGERY ASSESSMENT & PLAN:   POD 1 DEBRIDEMENT OF RIGHT LATERAL FOOT WOUND AND EXCISION OF FIFTH METATARSAL: Continue VAC.  This will be changed on Tuesdays Thursdays and Saturdays.  Hopefully he can go home with the [facility] once this is approved.  ID: The patient is on vancomycin (Day 2).  Gram stain of the bone that was excised shows gram-positive cocci.  The culture is pending.  DVT PROPHYLAXIS: The patient is back on Eliquis.  Not sure why he is on Eliquis.  VASCULAR QUALITY INITIATIVE: The patient is on aspirin and is on a statin.  MOBILITY: Physical therapy has been consulted for partial weightbearing right leg heel only using Darco shoe.  ANEMIA: He came in with a hemoglobin of 6.9.  He received 2 units of blood yesterday.  His hemoglobin today is 8.9.  I believe he has anemia secondary to chronic disease but will check a stool guaiac  DIABETES: His diabetes is poorly controlled.  I will consult the diabetic coordinator.  Certainly poorly controlled blood sugar can interfere with healing.  SUBJECTIVE:   No specific complaints this morning.  PHYSICAL EXAM:   Vitals:   01/26/20 1655 01/26/20 2007 01/26/20 2330 01/27/20 0453  BP: (!) 163/71 135/79 (!) 159/55 (!) 148/56  Pulse: 68 70 68 65  Resp: 15 15 14 13   Temp: 98 F (36.7 C) 98.4 F (36.9 C) 97.8 F (36.6 C) 98.3 F (36.8 C)  TempSrc: Oral Oral Oral Oral  SpO2: 99% 99% 98% 99%  Weight:      Height:       The VAC has a good seal.  LABS:   Lab Results  Component Value Date   WBC 9.1 01/27/2020   HGB 8.9 (L) 01/27/2020   HCT 28.0 (L) 01/27/2020   MCV 89.7 01/27/2020   PLT 221 01/27/2020   Lab Results  Component Value Date   CREATININE 2.86 (H) 01/26/2020   Lab Results  Component Value Date   INR 1.2 01/26/2020   CBG (last 3)  Recent Labs    01/26/20 1701 01/26/20 2112 01/27/20 0624  GLUCAP 336* 465* 417*    PROBLEM LIST:    Active Problems:   Atherosclerotic PVD with ulceration  (HCC)   CURRENT MEDS:   . sodium chloride   Intravenous Once  . apixaban  5 mg Oral BID  . aspirin EC  81 mg Oral Daily  . atorvastatin  20 mg Oral Daily  . furosemide  40 mg Oral BID  . hydrALAZINE  100 mg Oral TID  . insulin aspart  0-15 Units Subcutaneous TID WC  . insulin aspart  0-5 Units Subcutaneous QHS  . insulin aspart  4 Units Subcutaneous TID WC  . insulin NPH-regular Human  46-56 Units Subcutaneous See admin instructions  . lisinopril  10 mg Oral Daily  . metoprolol tartrate  100 mg Oral BID  . multivitamin with minerals  1 tablet Oral Daily  . pantoprazole  40 mg Oral Daily  . potassium chloride  20-40 mEq Oral Once  . prazosin  2 mg Oral Daily    [name] Office: [phone] 01/27/2020

## 2020-01-27 NOTE — Progress Notes (Signed)
PHYSICAL THERAPY EVALUATION  HISTORY OF CURRENT ILLNESS: 59 y/o male presented 3/16 for surgery, with the diagnosis of PERIPHERAL ARTERY WITH LEFT FOOT WOUND.  Procedure(s): IRRIGATION AND DEBRIDEMENT EXTREMITY RIGHT FOOT WOUND (Right). PMHx: permanent cardiac pacemaker, HTN. HLD, DM, CAD, CKD, a-fib, arthritis, anemia, alcohol abuse. L foot amputation of all digits.  CLINICAL IMPRESSION: Pt admitted with above diagnosis. PTA was living home with spouse and daughter, for most part was very independent with all ADLs, at times using walker as needed. But when every he was unable wife and daughter assisted with ADLs. Pt currently with functional limitations due to the deficits listed below (see PT Problem List). This am pt did well with mobility was able to complete most bed mob with stand by assist and set up, transfers with SBA/min guard assist, pt ambulated in room approx 81ft x 2 w/ RW and min guard assist (w/ seated rest between). Pt had on R tow offloading Darco shoe for ambulation, he c/o 4/10 pain with ambulation, PT attempted to pad shoe to reduce pain where pt indicated but did not do much to alleviate reported pain. Pt agreeable to practicing with shoe to see if pain abates. Pt will benefit from skilled PT to increase his overall strength, balance and coordination, activity tolerance,  independence and safety with mobility to allow discharge to the venue listed below.     D/C RECOMMENDATION: Home with Home health PT     01/27/20 1000  PT Visit Information  Last PT Received On 01/27/20  Assistance Needed +1  History of Present Illness 59 y/o male presented 3/16 for surgery, with the diagnosis of PERIPHERAL ARTERY WITH LEFT FOOT WOUND.  Procedure(s): IRRIGATION AND DEBRIDEMENT EXTREMITY RIGHT FOOT WOUND (Right). PMHx: permanent cardiac pacemaker, HTN. HLD, DM, CAD, CKD, a-fib, arthritis, anemia, alcohol abuse. L foot amputation of all digits.  Precautions  Precautions Fall;ICD/Pacemaker   Precaution Comments wound vac in place RLE  Restrictions  Weight Bearing Restrictions Yes  RLE Weight Bearing PWB  RLE Partial Weight Bearing Percentage or Pounds heel only  Home Living  Family/patient expects to be discharged to: Private residence  Living Arrangements Spouse/significant other;Children (daughters grown)  Available Help at Discharge Family  Type of [place] to enter  Entrance Stairs-Number of Steps 2  Entrance Stairs-Rails None (daughters help)  Home Layout One level  Engineer, manufacturing systems Yes  Home Equipment [place] - 2 wheels;Cane - single point;BSC;Shower seat - built in;Grab bars - tub/shower;Grab bars - toilet  Prior Function  Level of Independence Independent with assistive device(s) (wife and daughter helps if needed)  Communication  Communication No difficulties  Pain Assessment  Pain Assessment 0-10  Pain Score 4  Pain Location RLE middle of R foot with ambulation  Pain Descriptors / Indicators  (sting)  Pain Intervention(s) Limited activity within patient's tolerance;Monitored during session  Cognition  Arousal/Alertness Awake/alert  Behavior During Therapy WFL for tasks assessed/performed  Overall Cognitive Status Within Functional Limits for tasks assessed  Upper Extremity Assessment  Upper Extremity Assessment Generalized weakness  Lower Extremity Assessment  Lower Extremity Assessment Generalized weakness  Cervical / Trunk Assessment  Cervical / Trunk Assessment Normal  Bed Mobility  Overal bed mobility Needs Assistance  Bed Mobility Supine to Sit;Sit to Supine  Supine to sit Supervision  Sit to supine Supervision  General bed mobility comments needs set up  Transfers  Overall transfer level Needs assistance  Equipment used  Rolling walker (2 wheeled)  Transfers Sit to/from [facility]  Sit to Stand Min guard;Supervision  Stand pivot  transfers Min guard;Supervision  Ambulation/Gait  Ambulation/Gait assistance Min guard  Gait Distance (Feet) 38 Feet  Assistive device Rolling walker (2 wheeled)  Gait Pattern/deviations Step-to pattern;Wide base of support;Trunk flexed  General Gait Details ambulated in room 2 x 38 ft with RW and min guard assist, minimal cues needed for safety and sequencing, pt c/o pain 4/10 with ambulation in RLE  Gait velocity dec  Balance  Overall balance assessment Needs assistance  Sitting-balance support Feet supported  Sitting balance-Leahy Scale Fair  Sitting balance - Comments sits unsupported edge of bed with no LOB  Standing balance support During functional activity;Bilateral upper extremity supported  Standing balance-Leahy Scale Poor  Standing balance comment relies on UE for support  General Comments  General comments (skin integrity, edema, etc.) on room air and sats in 90s   PT - End of Session  Equipment Utilized During Treatment Gait belt  Activity Tolerance Patient limited by fatigue;Patient limited by pain  Patient left in bed;with call bell/phone within reach;with bed alarm set  Nurse Communication Mobility status  PT Assessment  PT Recommendation/Assessment Patient needs continued PT services  PT Visit Diagnosis Other abnormalities of gait and mobility (R26.89);Unsteadiness on feet (R26.81);Muscle weakness (generalized) (M62.81);Pain  Pain - Right/Left Right  Pain - part of body Leg  PT Problem List Decreased strength;Decreased activity tolerance;Decreased balance;Decreased mobility;Decreased coordination;Decreased knowledge of use of DME;Decreased safety awareness  PT Plan  PT Frequency (ACUTE ONLY) Min 3X/week  PT Treatment/Interventions (ACUTE ONLY) Gait training;DME instruction;Stair training;Functional mobility training;Therapeutic activities;Therapeutic exercise;Balance training;Neuromuscular re-education;Patient/family education  AM-PAC PT "6 Clicks" Mobility Outcome  Measure (Version 2)  Help needed turning from your back to your side while in a flat bed without using bedrails? 4  Help needed moving from lying on your back to sitting on the side of a flat bed without using bedrails? 4  Help needed moving to and from a bed to a chair (including a wheelchair)? 3  Help needed standing up from a chair using your arms (e.g., wheelchair or bedside chair)? 3  Help needed to walk in hospital room? 3  Help needed climbing 3-5 steps with a railing?  2  6 Click Score 19  Consider Recommendation of Discharge To: Home with [facility]  PT Recommendation  Recommendations for Other Services OT consult  Follow Up Recommendations Home health PT  PT equipment None recommended by PT  Individuals Consulted  Consulted and Agree with Results and Recommendations Patient  Acute Rehab PT Goals  Patient Stated Goal to get better and return home  PT Goal Formulation With patient  Time For Goal Achievement 02/10/20  Potential to Achieve Goals Fair  PT Time Calculation  PT Start Time (ACUTE ONLY) 1010  PT Stop Time (ACUTE ONLY) 1049  PT Time Calculation (min) (ACUTE ONLY) 39 min  PT General Charges  $$ ACUTE PT VISIT 1 Visit  PT Evaluation  $PT Eval Moderate Complexity 1 Mod  PT Treatments  $Gait Training 8-22 mins  $Therapeutic Activity 8-22 mins  Written Expression  Dominant Hand Right    Horald Chestnut, PT

## 2020-01-27 NOTE — Progress Notes (Signed)
Inpatient Diabetes Program Recommendations  AACE/ADA: New Consensus Statement on Inpatient Glycemic Control (2015)  Target Ranges:  Prepandial:   less than 140 mg/dL      Peak postprandial:   less than 180 mg/dL (1-2 hours)      Critically ill patients:  140 - 180 mg/dL   Lab Results  Component Value Date   GLUCAP 313 (H) 01/27/2020   HGBA1C 8.6 (H) 01/27/2020    Review of Glycemic Control  Diabetes history: DM2 Outpatient Diabetes medications: Novolin 70/30 56 units in am and 50 units QPM Current orders for Inpatient glycemic control: Novolog 70/30 56 units in am and 46 units before supper  HgbA1C - 8.6% Blood sugars in 300s - slowly coming down.  Inpatient Diabetes Program Recommendations:     May want to increase pm 70/30 dose to 50 units before supper. Novolog meal coverage 4 units tidwc has been discontinued.  Spoke with pt regarding his blood sugar control at home. Pt states the last HgbA1C in his PCP office was 6.9%. Pt sees Dr. Rosana Hoes for his diabetes, normally every 3-6 months. Checks his blood sugars 2x/day. Rarely has hypoglycemia. Gets very little exercise, mainly walking around the house. States it's tough to follow a good diet, has been eating candy lately. Only drinks diet soda. No juices. We discussed importance of getting his blood sugars in better control for healing wounds and prevention of short and long-term complications. Has no problems with getting his insulin. Pt will make effort to improve his diet to have better glucose control. No other questions. Appreciative of call.  Thank you. Lorenda Peck, RD, LDN, CDE Inpatient Diabetes Coordinator [phone]

## 2020-01-27 NOTE — Progress Notes (Signed)
Pt encouraged to get OOB and up to recliner.  He declines at this time.

## 2020-01-27 NOTE — Anesthesia Postprocedure Evaluation (Signed)
Anesthesia Post Note  Patient: Alexander [name]  Procedure(s) Performed: IRRIGATION AND DEBRIDEMENT EXTREMITY RIGHT FOOT WOUND (Right Foot)     Patient location during evaluation: PACU Anesthesia Type: General Level of consciousness: awake and alert Pain management: pain level controlled Vital Signs Assessment: post-procedure vital signs reviewed and stable Respiratory status: spontaneous breathing, nonlabored ventilation, respiratory function stable and patient connected to nasal cannula oxygen Cardiovascular status: blood pressure returned to baseline and stable Postop Assessment: no apparent nausea or vomiting Anesthetic complications: no    Last Vitals:  Vitals:   01/26/20 2330 01/27/20 0453  BP: (!) 159/55 (!) 148/56  Pulse: 68 65  Resp: 14 13  Temp: 36.6 C 36.8 C  SpO2: 98% 99%    Last Pain:  Vitals:   01/27/20 0759  TempSrc:   PainSc: 2                  Catalina Gravel

## 2020-01-27 NOTE — TOC Initial Note (Addendum)
Transition of Care [facility]) - Initial/Assessment Note    Patient Details  Name: Alexander [name] MRN: [id number] Date of Birth: [birth year]  Transition of Care [facility]) CM/SW Contact:    Carles Collet, RN Phone Number: 01/27/2020, 12:25 PM  Clinical Narrative:              Damaris Schooner w patient at bedside. He lives at home w his wife and 2 grown children. He is interested in [facility] services, he has no preference, agreeable to start w top rated companies. Referral accepted by [facility]. WIll need VAC Order faxed to [facility]. Anticipate delivery of VAC to room,  early tomorrow.   VAC changes TTS. Should get VAC dressing changed Thursday prior to DC. HH will start Saturday   Expected Discharge Plan: [place] Barriers to Discharge: Continued Medical Work up   Patient Goals and CMS Choice Patient states their goals for this hospitalization and ongoing recovery are:: to go home CMS Medicare.gov Compare Post Acute Care list provided to:: Patient Choice offered to / list presented to : Patient  Expected Discharge Plan and Services Expected Discharge Plan: [place] Arranged: RN [facility] Agency: [facility] Date [place]: 01/27/20 Time [place]: 39 Representative spoke with at [place]: [place]  Prior Living Arrangements/Services                       Activities of Daily Living Home Assistive Devices/Equipment: Environmental consultant (specify type), Cane (specify quad or straight), CBG Meter, Built-in shower seat ADL Screening (condition at time of admission) Patient's cognitive ability adequate to safely complete daily activities?: Yes Is the patient deaf or have difficulty hearing?: No Does the patient have difficulty seeing, even when wearing glasses/contacts?: No Does the patient have difficulty concentrating, remembering, or making decisions?: No Patient able to express need for assistance  with ADLs?: Yes Does the patient have difficulty dressing or bathing?: No Independently performs ADLs?: Yes (appropriate for developmental age) Does the patient have difficulty walking or climbing stairs?: Yes Weakness of Legs: None Weakness of Arms/Hands: None  Permission Sought/Granted                  Emotional Assessment              Admission diagnosis:  Atherosclerotic PVD with ulceration ([place]) [I70.209, L98.499] Patient Active Problem List   Diagnosis Date Noted  . Heel ulcer ([place]) 11/05/2019  . Atherosclerotic PVD with ulceration ([place]) 09/09/2017  . Essential hypertension, benign 02/07/2015  . Renal insufficiency 02/07/2015  . Post PTCA 11/09/2014  . S/P PTCA (percutaneous transluminal coronary angioplasty) 11/09/2014  . Pacemaker: [place] DR MRI [id number] Dual chamber pacemaker 10/08/2014 10/08/2014  . Encounter for care of pacemaker 10/08/2014  . Sinus node dysfunction ([place]) 10/08/2014  . Cardiac asystole ([place]) 10/07/2014  . Cardiac syncope 10/07/2014  . Diabetes mellitus with stage 3 chronic kidney disease ([place]) 10/07/2014  . CAD (coronary artery disease), native coronary artery 10/07/2014  . Diabetes mellitus due to underlying condition without complications ([place]) 83/25/4982  . Anemia, unspecified 05/25/2014  . Syncope 04/30/2014  . Atherosclerosis of native arteries of the extremities with gangrene ([place]) 12/16/2013  . Volume overload 10/30/2013  . Acute renal failure ([place]) 10/28/2013  .  PAD (peripheral artery disease) ([place]) 10/27/2013  . Leukocytosis 10/21/2013  . Anemia 10/21/2013  . Osteomyelitis of left foot ([place]) 08/19/2013  . Spinal stenosis, lumbar region, with neurogenic claudication 06/12/2012    Class: Diagnosis of  . DIABETES MELLITUS, TYPE II 03/15/2009  . ALCOHOL ABUSE 03/15/2009  . TOBACCO USE 03/15/2009  . HYPERTENSION 03/15/2009   PCP:  [name], MD Pharmacy:   [facility] [address], [place] [address] Phone: [phone] Fax: [phone]     Social Determinants of Health (SDOH) Interventions    Readmission Risk Interventions No flowsheet data found.

## 2020-01-28 LAB — BASIC METABOLIC PANEL
Anion gap: 8 (ref 5–15)
BUN: 92 mg/dL — ABNORMAL HIGH (ref 8–23)
CO2: 20 mmol/L — ABNORMAL LOW (ref 22–32)
Calcium: 8.6 mg/dL — ABNORMAL LOW (ref 8.9–10.3)
Chloride: 109 mmol/L (ref 98–111)
Creatinine, Ser: 3.01 mg/dL — ABNORMAL HIGH (ref 0.61–1.24)
GFR calc Af Amer: 25 mL/min — ABNORMAL LOW (ref >60)
GFR calc non Af Amer: 21 mL/min — ABNORMAL LOW (ref >60)
Glucose, Bld: 154 mg/dL — ABNORMAL HIGH (ref 70–99)
Potassium: 4.5 mmol/L (ref 3.5–5.1)
Sodium: 137 mmol/L (ref 135–145)

## 2020-01-28 LAB — GLUCOSE, CAPILLARY
Glucose-Capillary: 182 mg/dL — ABNORMAL HIGH (ref 70–99)
Glucose-Capillary: 243 mg/dL — ABNORMAL HIGH (ref 70–99)

## 2020-01-28 LAB — OCCULT BLOOD X 1 CARD TO LAB, STOOL: Fecal Occult Bld: NEGATIVE

## 2020-01-28 MED ORDER — INSULIN ASPART PROT & ASPART (70-30 MIX) 100 UNIT/ML ~~LOC~~ SUSP: 50.0000 [IU] | Freq: Every day | SUBCUTANEOUS | Status: DC

## 2020-01-28 MED ORDER — INSULIN ASPART PROT & ASPART (70-30 MIX) 100 UNIT/ML ~~LOC~~ SUSP
56.0000 [IU] | Freq: Every day | SUBCUTANEOUS | Status: DC
  Administered 2020-01-28: 56 [IU] via SUBCUTANEOUS

## 2020-01-28 MED ORDER — CIPROFLOXACIN HCL 250 MG PO TABS: 250.0000 mg | ORAL_TABLET | Freq: Two times a day (BID) | ORAL | 0 refills | Status: DC

## 2020-01-28 MED ORDER — OXYCODONE-ACETAMINOPHEN 5-325 MG PO TABS: 1.0000 | ORAL_TABLET | Freq: Four times a day (QID) | ORAL | 0 refills | Status: DC | PRN

## 2020-01-28 MED ORDER — ATORVASTATIN CALCIUM 80 MG PO TABS: 80.0000 mg | ORAL_TABLET | Freq: Every day | ORAL | Status: DC

## 2020-01-28 NOTE — Progress Notes (Signed)
D/c tele and Ivs. Packed pt's belongings. Went over AVS with pt. Pt verbalized understood. Sent humolog mix 70/30 home with pt. Waiting for pt's ride.   Lavenia Atlas, RN

## 2020-01-28 NOTE — Progress Notes (Signed)
Pharmacy Antibiotic Note  Alexander [name] is a 59 y.o. male on vancomycin for R foot gangrene with osteo of fifth metatarsal s/p amputation.  Pharmacy dosing vancomycin -WBC= 9.1, CrCl ~ 35 -wound cultures with GPC in pairs  Plan: -Continue vancomycin 1750mg  IV q48hr (estimated AUC= 514 using SCr= 3.01) -Consider defining length of therapy -Will follow renal function, cultures and clinical progress    Height: 6' (182.9 cm) Weight: 270 lb 1 oz (122.5 kg) IBW/kg (Calculated) : 77.6  Temp (24hrs), Avg:98 F (36.7 C), Min:97.5 F (36.4 C), Max:98.6 F (37 C)  Recent Labs  Lab 01/26/20 0811 01/27/20 0332 01/28/20 0305  WBC 7.4 9.1  --   CREATININE 2.86*  --  3.01*    Estimated Creatinine Clearance: 34.8 mL/min (A) (by C-G formula based on SCr of 3.01 mg/dL (H)).    Allergies  Allergen Reactions  . Keflex [Cephalexin] Nausea And Vomiting  . Pletal [Cilostazol] Palpitations    "Can hear heart beating loudly".  . Sulfa Antibiotics Rash    Thank you for allowing pharmacy to be a part of this patient's care.  Hildred Laser, PharmD Clinical Pharmacist **Pharmacist phone directory can now be found on [email or web address] ([email or web address]).  Listed under [place].

## 2020-01-28 NOTE — Consult Note (Signed)
[place] Nurse Consult Note: Patient receiving care in [place]. Reason for Consult: VAC placement to right lateral foot incision Wound type: surgical Pressure Injury POA: Yes/No/NA Measurement: 7.8 cm x 5 cm x 1.8 cm Wound bed: beefy red Drainage (amount, consistency, odor) sanginous Periwound: intact Dressing procedure/placement/frequency:  Saline moistened gauze dressing removed from right lateral foot.  One piece of black foam placed into wound bed, immediate VAC seal obtained. Patient tolerated very well.  I understand the patient is to be discharged today. Thank you for the consult.  Discussed plan of care with the patient and bedside nurse.  [place] nurse will not follow at this time.  Please re-consult the [place] team if needed.  Val Riles, RN, MSN, CWOCN, CNS-BC, pager [phone]

## 2020-01-28 NOTE — Progress Notes (Signed)
MOBILITY TEAM - Progress Note   01/28/20 1009  Mobility  Activity Transferred:  Bed to chair  Level of Assistance Modified independent, requires aide device or extra time  Assistive Device Front wheel walker  RLE Weight Bearing PWB  RLE Partial Weight Bearing Percentage or Pounds heel only in darco shoe  Distance Ambulated (ft) 2 ft  Mobility Response Tolerated well  Mobility performed by Mobility specialist  Bed Position Chair   Pt declined ambulation, agreeable to transfer from bed to recliner. Able to take a few steps with walker and R darco shoe donned. Noted plan for d/c home today.  Mabeline Caras, PT, DPT Mobility Team Pager [phone]

## 2020-01-28 NOTE — TOC Transition Note (Signed)
Transition of Care [facility]) - CM/SW Discharge Note   Patient Details  Name: Alexander [name] MRN: [id number] Date of Birth: [birth year]  Transition [facility] CM/SW Contact:  Bartholomew Crews, RN Phone Number: [phone] 01/28/2020, 9:01 AM   Clinical Narrative:    Patient to transition home today. Spoke with Traci at [facility] is on schedule for delivery to patient bedside this AM. Sp[facility] with Britney at [facility] - [facility] for [facility] RN, PT, OT - [facility] to do start of care on Saturday. Spoke with bedside RN, wound care to change vac dressing today prior to discharge. No further TOC needs identified at this time.    Final next level of care: [place] Barriers to Discharge: No Barriers Identified   Patient Goals and CMS Choice Patient states their goals for this hospitalization and ongoing recovery are:: to go home CMS Medicare.gov Compare Post Acute Care list provided to:: Patient Choice offered to / list presented to : Patient  Discharge Placement                       Discharge Plan and Services                DME Arranged: Vac   Date DME Agency Contacted: 01/28/20 Time DME Agency Contacted: 0900 Representative spoke with at DME Agency: [place]: RN, PT, OT [place] Agency: [facility] Date [place]: 01/28/20 Time [place]: 0901 Representative spoke with at [place]: [place] ([place]) Interventions     Readmission Risk Interventions No flowsheet data found.

## 2020-01-28 NOTE — Discharge Summary (Signed)
Discharge Summary    Alexander [name] 1961/11/06 59 y.o. male  [id number]  Admission Date: 01/26/2020  Discharge Date: 01/28/2020  Physician: Alexander [name], *  Admission Diagnosis: Atherosclerotic PVD with ulceration (Alexander [name]) [I70.209, L98.499]   HPI:   This is a 59 y.o. male who presents for evaluation right LE s/p angiogram with intervention  Right below-knee popliteal, tibioperonealtrunk and posterior tibial artery angioplasty (3 mm x 150 mmSterling).             He previously underwent a right SFA to below-knee popliteal bypass with Dr. Randa [name] in 2018. Ultimately this occluded shortly after surgery and he had required a revision with Dr. Donzetta [name]. He presented to clinic last week with a new wound on his right heel.  He was seen by Dr. Scot [name] on 12/23/19 and he had developed a new right lateral foot wound since the angiogram.  He has been walking on the outside of his foot to keep pressure off his heel.  The wound has a bad odor and has gotten bigger.               Past surgical history:  On 10/27/2013 he had a left femoral to posterior tibial artery bypass. On 09/10/2017 the patient had a rightsuperficialfemoralarteryto below-knee pop bypasswith vein and ray amputation of the right fourth and fifth toes. On 09/12/2017 the patient had a revision of this bypass.The graft was retunneled after it had occluded early postop. On 11/11/2019, the patient had angioplasty of the right below-knee popliteal artery tibial peroneal trunk and posterior tibial artery.  [name] Course:  The patient was admitted to the [name] and taken to the operating room on 01/26/2020 and underwent: Excisional debridement of lateral wound right foot including bone, skin, subcutaneous tissue, fascia, and adipose tissue. Excision of fifth metatarsal right foot Placement of VAC    Findings:   There was good bleeding at the site of the debridement.  The fifth metatarsal was clearly infected and this  was excised and culture sent of the bone.  The pt tolerated the procedure well and was transported to the PACU in good condition.   POD 1, his vac was continued and scheduled for vac changes on T/T/S.  His cx revealed GPC and was on Vanc.  He was back on his Eliquis.  Pt with anemia.  He came in with hgb of 6.9 and received 2 units PRBC's.  His f/u hgb was 8.9.  Dr. Scot [name] felt his anemia was secondary to chronic dz but his stool was checked and was negative.   POD 2, pt doing well.    Wound cx as follows: Specimen Description BONE RIGHT FOOT WOUND   Special Requests A   Gram Stain RARE WBC PRESENT,BOTH PMN AND MONONUCLEAR  FEW GRAM POSITIVE COCCI IN PAIRS  Performed at Alexander [name], [address] [place], [place] [address]   Culture FEW DIPHTHEROIDS(CORYNEBACTERIUM SPECIES)  FEW STREPTOCOCCUS MITIS/ORALIS  Standardized susceptibility testing for this organism is not available.  FOR DIPHTHEROIDS (CORYNBACTERIUM SPECIES)  NO ANAEROBES ISOLATED; CULTURE IN PROGRESS FOR 5 DAYS   Report Status PENDING   Organism ID, Bacteria STREPTOCOCCUS MITIS/ORALIS   Resulting Agency [name]  Susceptibility   Streptococcus mitis/oralis    MIC    CEFTRIAXONE  Sensitive1    CLINDAMYCIN <=0.25 SENS... Sensitive    PENICILLIN  Sensitive2    TETRACYCLINE 0.5 SENSITIVE  Sensitive    VANCOMYCIN 0.5 SENSITIVE  Sensitive      He was  discharged home with Alexander [name] and PT.  Upon doing discharge summary for this pt-there is no sensitivity for Cipro.  I called the micro [name] and spoke with Alexander [name].  She is adding Cipro to be tested and will be sent out.  The remainder of the [name] course consisted of increasing mobilization and increasing intake of solids without difficulty.  CBC    Component Value Date/Time   WBC 9.1 01/27/2020 0332   RBC 3.12 (L) 01/27/2020 0332   HGB 8.9 (L) 01/27/2020 0332   HCT 28.0 (L) 01/27/2020 0332   PLT 221 01/27/2020 0332   MCV 89.7 01/27/2020 0332   MCH 28.5  01/27/2020 0332   MCHC 31.8 01/27/2020 0332   RDW 14.9 01/27/2020 0332   LYMPHSABS 1.2 08/08/2017 1546   MONOABS 0.5 08/08/2017 1546   EOSABS 0.2 08/08/2017 1546   BASOSABS 0.1 08/08/2017 1546    BMET    Component Value Date/Time   NA 137 01/28/2020 0305   K 4.5 01/28/2020 0305   CL 109 01/28/2020 0305   CO2 20 (L) 01/28/2020 0305   GLUCOSE 154 (H) 01/28/2020 0305   BUN 92 (H) 01/28/2020 0305   CREATININE 3.01 (H) 01/28/2020 0305   CREATININE 1.52 (H) 02/07/2015 1151   CALCIUM 8.6 (L) 01/28/2020 0305   GFRNONAA 21 (L) 01/28/2020 0305   GFRNONAA 50 (L) 02/07/2015 1151   GFRAA 25 (L) 01/28/2020 0305   GFRAA 58 (L) 02/07/2015 1151      Discharge Instructions    Call MD for:  redness, tenderness, or signs of infection (pain, swelling, bleeding, redness, odor or green/yellow discharge around incision site)   Complete by: As directed    Call MD for:  severe or increased pain, loss or decreased feeling  in affected limb(s)   Complete by: As directed    Call MD for:  temperature >100.5   Complete by: As directed    Discharge instructions   Complete by: As directed    Heel weight bearing only with Darco shoe.   Discharge wound care:   Complete by: As directed    Wound vac changes every Tuesday, Thursday, and Saturday.   Driving Restrictions   Complete by: As directed    No driving for 4 weeks and while taking pain medicaiton.   Lifting restrictions   Complete by: As directed    No heavy  lifting for 4 weeks   Resume previous diet   Complete by: As directed       Discharge Diagnosis:  Atherosclerotic PVD with ulceration ([name]) [I70.209, L98.499]  Secondary Diagnosis: Patient Active Problem List   Diagnosis Date Noted  . Heel ulcer ([name]) 11/05/2019  . Atherosclerotic PVD with ulceration ([name]) 09/09/2017  . Essential hypertension, benign 02/07/2015  . Renal insufficiency 02/07/2015  . Post PTCA 11/09/2014  . S/P PTCA (percutaneous transluminal coronary angioplasty)  11/09/2014  . Pacemaker: [place] DR MRI [id number] Dual chamber pacemaker 10/08/2014 10/08/2014  . Encounter for care of pacemaker 10/08/2014  . Sinus node dysfunction ([place]) 10/08/2014  . Cardiac asystole ([place]) 10/07/2014  . Cardiac syncope 10/07/2014  . Diabetes mellitus with stage 3 chronic kidney disease ([place]) 10/07/2014  . CAD (coronary artery disease), native coronary artery 10/07/2014  . Diabetes mellitus due to underlying condition without complications ([place]) 80/99/8338  . Anemia, unspecified 05/25/2014  . Syncope 04/30/2014  . Atherosclerosis of native arteries of the extremities with gangrene ([place]) 12/16/2013  . Volume overload 10/30/2013  . Acute renal failure ([place]) 10/28/2013  . PAD (  peripheral artery disease) ([place]) 10/27/2013  . Leukocytosis 10/21/2013  . Anemia 10/21/2013  . Osteomyelitis of left foot ([place]) 08/19/2013  . Spinal stenosis, lumbar region, with neurogenic claudication 06/12/2012    Class: Diagnosis of  . DIABETES MELLITUS, TYPE II 03/15/2009  . ALCOHOL ABUSE 03/15/2009  . TOBACCO USE 03/15/2009  . HYPERTENSION 03/15/2009   Past Medical History:  Diagnosis Date  . Alcohol abuse   . Anemia   . Arthritis    "patient does not think so."  . Atrial fibrillation ([place])   . Cardiac syncope 10/07/14   rapid A fib with 8 sec pauses on converison with syncope- temp pacing wire placed then PPM  . Cataract    BILATERAL  . Chronic kidney disease   . Coronary artery disease   . Diabetes mellitus    dx---been  awhile  . Encounter for care of pacemaker 10/08/2014  . History of blood transfusion   . Hyperlipemia   . Hypertension   . Peripheral vascular disease ([place])   . Presence of permanent cardiac pacemaker 10/08/2014  . Sinus node dysfunction ([place]) 10/08/2014     Allergies as of 01/28/2020      Reactions   Keflex [cephalexin] Nausea And Vomiting   Pletal [cilostazol] Palpitations   "Can hear heart beating loudly".   Sulfa Antibiotics Rash        Medication List    TAKE these medications   Accu-Chek Advantage Diabetes kit Use as instructed   Accu-Chek FastClix Lancets Misc Check blood sugar TID & QHS   apixaban 5 MG Tabs tablet Commonly known as: Eliquis Take 1 tablet (5 mg total) by mouth 2 (two) times daily.   aspirin 81 MG EC tablet Take 81 mg by mouth daily.   atorvastatin 20 MG tablet Commonly known as: LIPITOR Take 1 tablet by mouth once daily   blood glucose meter kit and supplies Kit Check blood sugar TID & QHS   ciprofloxacin 250 MG tablet Commonly known as: Cipro Take 1 tablet (250 mg total) by mouth 2 (two) times daily.   clopidogrel 75 MG tablet Commonly known as: Plavix Take 1 tablet (75 mg total) by mouth daily.   furosemide 40 MG tablet Commonly known as: LASIX Take 40 mg by mouth 2 (two) times daily.   glucose blood test strip Use as instructed   glucose blood test strip Commonly known as: Accu-Chek Aviva Plus Use as instructed   glucose blood test strip Commonly known as: Choice DM [place] G20 Test Strips Check blood sugar TID & QHS   hydrALAZINE 100 MG tablet Commonly known as: APRESOLINE TAKE 1 TABLET BY MOUTH THREE TIMES DAILY   lisinopril 10 MG tablet Commonly known as: ZESTRIL Take 1 tablet by mouth once daily   metoprolol tartrate 100 MG tablet Commonly known as: LOPRESSOR Take 1 tablet by mouth twice daily   multivitamin with minerals Tabs tablet Take 1 tablet by mouth daily.   NOVOLIN 70/30 [place] Inject 46-56 Units into the skin See admin instructions. Inject 56 units subcutaneously in the morning & inject 46 units subcutaneously in the evening.   oxyCODONE-acetaminophen 5-325 MG tablet Commonly known as: Percocet Take 1 tablet by mouth every 6 (six) hours as needed.   prazosin 2 MG capsule Commonly known as: MINIPRESS TAKE 1 CAPSULE BY MOUTH EVERY  EVENING AFTER SUPPER What changed: See the new instructions.            Discharge Care Instructions  (From  admission, onward)  Start     Ordered   01/28/20 0000  Discharge wound care:    Comments: Wound vac changes every Tuesday, Thursday, and Saturday.   01/28/20 0805          Prescriptions given: 1.  Roxicet #20 No Refill 2.  Cipro 281m bid #14 No refill.  (spoke with pharmacist and recommend 2571mbid given renal function).   Instructions: 1.  Wound vac change on T/T/S 2.  Heel weight bearing only  Disposition: home with HHPT/RN  Patient's condition: is Good  Follow up: 1. Dr. DiScot Dockn 2 weeks   SaLeontine LocketPA-C [phone]/18/2021  8:05 AM

## 2020-01-28 NOTE — Progress Notes (Signed)
   VASCULAR SURGERY ASSESSMENT & PLAN:   POD 2 DEBRIDEMENT OF RIGHT LATERAL FOOT WOUND AND EXCISION OF FIFTH METATARSAL: Continue VAC.  This will be changed on Tuesdays Thursdays and Saturdays.  Home with the [employer] once this is approved.  ID: The patient is on vancomycin (Day 3).  Gram stain of the bone that was excised shows gram-positive cocci.  The culture is pending. When discharged, can go home on po Cipro.   DVT PROPHYLAXIS: The patient is on Eliquis.  VASCULAR QUALITY INITIATIVE: The patient is on aspirin and is on a statin.  MOBILITY: Physical therapy has worked with the patient.  He feels comfortable using the Darco shoe.  ANEMIA: Received 2 units on admission. F/U CBC tomorrow. Stool guiac pending.   DIABETES: Appreciate diabetic coordinator's help.  Blood sugars under better control.  SUBJECTIVE:   No complaints. Wants to go home.   PHYSICAL EXAM:   Vitals:   01/27/20 1553 01/27/20 2010 01/28/20 0033 01/28/20 0836  BP: 122/60 (!) 118/56 126/61 (!) 148/75  Pulse: 62 64 (!) 59 62  Resp: 17 12 13 15   Temp: 97.8 F (36.6 C) 98.3 F (36.8 C) 98 F (36.7 C) 98.6 F (37 C)  TempSrc: Oral Oral Oral Oral  SpO2: 96% 96% 97% 100%  Weight:      Height:       Inspected wound. Good bleeding. Looks "OK."  LABS:   Lab Results  Component Value Date   WBC 9.1 01/27/2020   HGB 8.9 (L) 01/27/2020   HCT 28.0 (L) 01/27/2020   MCV 89.7 01/27/2020   PLT 221 01/27/2020   Lab Results  Component Value Date   CREATININE 3.01 (H) 01/28/2020   Lab Results  Component Value Date   INR 1.2 01/26/2020   CBG (last 3)  Recent Labs    01/27/20 1556 01/27/20 2117 01/28/20 0643  GLUCAP 313* 216* 182*    PROBLEM LIST:    Active Problems:   Atherosclerotic PVD with ulceration (HCC)   CURRENT MEDS:   . sodium chloride   Intravenous Once  . apixaban  5 mg Oral BID  . aspirin EC  81 mg Oral Daily  . atorvastatin  20 mg Oral Daily  . furosemide  40 mg Oral BID  .  hydrALAZINE  100 mg Oral TID  . insulin aspart  0-20 Units Subcutaneous TID WC  . insulin aspart  0-5 Units Subcutaneous QHS  . insulin aspart protamine- aspart  50 Units Subcutaneous Q supper   And  . insulin aspart protamine- aspart  56 Units Subcutaneous Q breakfast  . lisinopril  10 mg Oral Daily  . metoprolol tartrate  100 mg Oral BID  . multivitamin with minerals  1 tablet Oral Daily  . pantoprazole  40 mg Oral Daily  . potassium chloride  20-40 mEq Oral Once  . prazosin  2 mg Oral Daily    [name] Office: [phone] 01/28/2020'

## 2020-01-30 DIAGNOSIS — M48062 Spinal stenosis, lumbar region with neurogenic claudication: Secondary | ICD-10-CM | POA: Diagnosis not present

## 2020-01-30 DIAGNOSIS — L97312 Non-pressure chronic ulcer of right ankle with fat layer exposed: Secondary | ICD-10-CM | POA: Diagnosis not present

## 2020-01-30 DIAGNOSIS — I495 Sick sinus syndrome: Secondary | ICD-10-CM | POA: Diagnosis not present

## 2020-01-30 DIAGNOSIS — B9689 Other specified bacterial agents as the cause of diseases classified elsewhere: Secondary | ICD-10-CM | POA: Diagnosis not present

## 2020-01-30 DIAGNOSIS — N183 Chronic kidney disease, stage 3 unspecified: Secondary | ICD-10-CM | POA: Diagnosis not present

## 2020-01-30 DIAGNOSIS — E1122 Type 2 diabetes mellitus with diabetic chronic kidney disease: Secondary | ICD-10-CM | POA: Diagnosis not present

## 2020-01-30 DIAGNOSIS — I70233 Atherosclerosis of native arteries of right leg with ulceration of ankle: Secondary | ICD-10-CM | POA: Diagnosis not present

## 2020-01-30 DIAGNOSIS — Z48812 Encounter for surgical aftercare following surgery on the circulatory system: Secondary | ICD-10-CM | POA: Diagnosis not present

## 2020-01-30 DIAGNOSIS — I4891 Unspecified atrial fibrillation: Secondary | ICD-10-CM | POA: Diagnosis not present

## 2020-01-30 DIAGNOSIS — E1151 Type 2 diabetes mellitus with diabetic peripheral angiopathy without gangrene: Secondary | ICD-10-CM | POA: Diagnosis not present

## 2020-01-30 DIAGNOSIS — I2510 Atherosclerotic heart disease of native coronary artery without angina pectoris: Secondary | ICD-10-CM | POA: Diagnosis not present

## 2020-01-30 DIAGNOSIS — M1990 Unspecified osteoarthritis, unspecified site: Secondary | ICD-10-CM | POA: Diagnosis not present

## 2020-01-30 DIAGNOSIS — I70235 Atherosclerosis of native arteries of right leg with ulceration of other part of foot: Secondary | ICD-10-CM | POA: Diagnosis not present

## 2020-01-30 DIAGNOSIS — D631 Anemia in chronic kidney disease: Secondary | ICD-10-CM | POA: Diagnosis not present

## 2020-01-30 DIAGNOSIS — I129 Hypertensive chronic kidney disease with stage 1 through stage 4 chronic kidney disease, or unspecified chronic kidney disease: Secondary | ICD-10-CM | POA: Diagnosis not present

## 2020-01-30 DIAGNOSIS — L97516 Non-pressure chronic ulcer of other part of right foot with bone involvement without evidence of necrosis: Secondary | ICD-10-CM | POA: Diagnosis not present

## 2020-02-01 LAB — AEROBIC/ANAEROBIC CULTURE W GRAM STAIN (SURGICAL/DEEP WOUND)

## 2020-02-02 DIAGNOSIS — L03115 Cellulitis of right lower limb: Secondary | ICD-10-CM | POA: Diagnosis not present

## 2020-02-02 DIAGNOSIS — I739 Peripheral vascular disease, unspecified: Secondary | ICD-10-CM | POA: Diagnosis not present

## 2020-02-02 DIAGNOSIS — N184 Chronic kidney disease, stage 4 (severe): Secondary | ICD-10-CM | POA: Diagnosis not present

## 2020-02-02 DIAGNOSIS — D631 Anemia in chronic kidney disease: Secondary | ICD-10-CM | POA: Diagnosis not present

## 2020-02-08 ENCOUNTER — Encounter (HOSPITAL_COMMUNITY)
Admission: RE | Admit: 2020-02-08 | Discharge: 2020-02-08 | Disposition: A | Discharge status: Home / Self Care | Payer: Medicare Other | Source: Ambulatory Visit | Attending: Nephrology | Admitting: Nephrology

## 2020-02-08 ENCOUNTER — Other Ambulatory Visit: Payer: Self-pay

## 2020-02-08 ENCOUNTER — Encounter (HOSPITAL_COMMUNITY): Payer: Medicare Other

## 2020-02-08 VITALS — BP 125/52 | HR 68 | Temp 96.7°F | Resp 18

## 2020-02-08 DIAGNOSIS — E1122 Type 2 diabetes mellitus with diabetic chronic kidney disease: Secondary | ICD-10-CM

## 2020-02-08 DIAGNOSIS — N183 Chronic kidney disease, stage 3 unspecified: Secondary | ICD-10-CM | POA: Diagnosis not present

## 2020-02-08 LAB — POCT HEMOGLOBIN-HEMACUE: Hemoglobin: 9.5 g/dL — ABNORMAL LOW (ref 13.0–17.0)

## 2020-02-08 MED ORDER — DARBEPOETIN ALFA 200 MCG/0.4ML IJ SOSY
200.0000 ug | PREFILLED_SYRINGE | INTRAMUSCULAR | Status: DC
  Administered 2020-02-08: 200 ug via SUBCUTANEOUS

## 2020-02-08 MED ORDER — DARBEPOETIN ALFA 200 MCG/0.4ML IJ SOSY
PREFILLED_SYRINGE | INTRAMUSCULAR | Status: AC
  Filled 2020-02-08: qty 0.4

## 2020-02-09 ENCOUNTER — Telehealth (HOSPITAL_COMMUNITY): Payer: Self-pay

## 2020-02-09 NOTE — Telephone Encounter (Signed)
The above patient or their representative was contacted and gave the following answers to these questions:        ° °Do you have any of the following symptoms?    NO ° °Fever                    Cough                   Shortness of breath ° °Do  you have any of the following other symptoms?  ° ° muscle pain         vomiting,        diarrhea        rash         weakness        red eye        abdominal pain         bruising          bruising or bleeding              joint pain           severe headache ° ° ° °Have you been in contact with someone who was or has been sick in the past 2 weeks?  NO ° °Yes                 Unsure                         Unable to assess  ° °Does the person that you were in contact with have any of the following symptoms?  ° °Cough         shortness of breath           muscle pain         vomiting,            diarrhea            rash            weakness           fever            red eye           abdominal pain          ° °bruising  or  bleeding                joint pain                severe headache ° °           ° ° ° ° °COMMENTS OR ACTION PLAN FOR THIS PATIENT:  ° ° ° ° ° °ALL QUESTIONS WERE ANWERED/[facility] °

## 2020-02-10 ENCOUNTER — Encounter: Payer: Self-pay | Admitting: Vascular Surgery

## 2020-02-10 ENCOUNTER — Ambulatory Visit (INDEPENDENT_AMBULATORY_CARE_PROVIDER_SITE_OTHER): Payer: Self-pay | Admitting: Vascular Surgery

## 2020-02-10 ENCOUNTER — Other Ambulatory Visit: Payer: Self-pay

## 2020-02-10 VITALS — BP 145/68 | HR 65 | Temp 97.7°F | Resp 20 | Ht 72.0 in | Wt 270.0 lb

## 2020-02-10 DIAGNOSIS — N184 Chronic kidney disease, stage 4 (severe): Secondary | ICD-10-CM | POA: Diagnosis not present

## 2020-02-10 DIAGNOSIS — I70269 Atherosclerosis of native arteries of extremities with gangrene, unspecified extremity: Secondary | ICD-10-CM

## 2020-02-10 NOTE — Progress Notes (Signed)
Note on my history the patient stopped elevating his leg once we put the [facility] on thinking this was no longer needed.  Patient name: Alexander [name] MRN: [id number] DOB: Apr 07, 1961 Sex: male  REASON FOR VISIT:   Follow-up after excisional debridement of wound on the lateral aspect of the right foot.  HPI:   Alexander [name] is a pleasant 59 y.o. male who is undergone previous right femoral popliteal bypass and also tibial interventions.  He presented with a gangrenous wound the lateral aspect of his right foot which progressed despite aggressive outpatient wound care.  He was set up for debridement.  On 01/26/2020 I debrided the wound on the lateral aspect of his right foot including debridement of bone, subcutaneous tissue, skin, and fascia.  I excised the fifth metatarsal.  He had placement of a VAC.  He comes in for a wound check.  The home health nurse discontinued his VAC on Monday 2 days ago because he was having a lot of swelling and seeping from the skin.  He denies fever or chills.  Current Outpatient Medications  Medication Sig Dispense Refill  . ACCU-CHEK FASTCLIX LANCETS MISC Check blood sugar TID & QHS 102 each 2  . apixaban (ELIQUIS) 5 MG TABS tablet Take 1 tablet (5 mg total) by mouth 2 (two) times daily. 180 tablet 3  . aspirin 81 MG EC tablet Take 81 mg by mouth daily.     Marland Kitchen atorvastatin (LIPITOR) 20 MG tablet Take 1 tablet by mouth once daily (Patient taking differently: Take 20 mg by mouth daily. ) 90 tablet 2  . blood glucose meter kit and supplies KIT Check blood sugar TID & QHS 1 each 0  . Blood Glucose Monitoring Suppl (ACCU-CHEK ADVANTAGE DIABETES) kit Use as instructed 1 each 0  . clopidogrel (PLAVIX) 75 MG tablet Take 1 tablet (75 mg total) by mouth daily. 30 tablet 0  . furosemide (LASIX) 40 MG tablet Take 40 mg by mouth 2 (two) times daily.    Marland Kitchen glucose blood (ACCU-CHEK AVIVA PLUS) test strip Use as instructed 100 each 12  . glucose blood (CHOICE DM FORA G20 TEST STRIPS)  test strip Check blood sugar TID & QHS 100 each 12  . glucose blood test strip Use as instructed 100 each 12  . hydrALAZINE (APRESOLINE) 100 MG tablet TAKE 1 TABLET BY MOUTH THREE TIMES DAILY (Patient taking differently: Take 100 mg by mouth 3 (three) times daily. ) 270 tablet 0  . Insulin NPH Isophane & Regular (NOVOLIN 70/30 [place]) Inject 46-56 Units into the skin See admin instructions. Inject 56 units subcutaneously in the morning & inject 46 units subcutaneously in the evening.    Marland Kitchen lisinopril (ZESTRIL) 10 MG tablet Take 1 tablet by mouth once daily (Patient taking differently: Take 10 mg by mouth daily. ) 90 tablet 2  . metoprolol tartrate (LOPRESSOR) 100 MG tablet Take 1 tablet by mouth twice daily 180 tablet 0  . Multiple Vitamin (MULTIVITAMIN WITH MINERALS) TABS tablet Take 1 tablet by mouth daily.    . prazosin (MINIPRESS) 2 MG capsule TAKE 1 CAPSULE BY MOUTH EVERY  EVENING AFTER SUPPER (Patient taking differently: Take 2 mg by mouth daily. ) 90 capsule 0  . oxyCODONE-acetaminophen (PERCOCET) 5-325 MG tablet Take 1 tablet by mouth every 6 (six) hours as needed. (Patient not taking: Reported on 02/10/2020) 20 tablet 0   No current facility-administered medications for this visit.    REVIEW OF SYSTEMS:  '[X]'  denotes positive finding, '[ ]'   denotes negative finding Vascular    Leg swelling    Cardiac    Chest pain or chest pressure:    Shortness of breath upon exertion:    Short of breath when lying flat:    Irregular heart rhythm:    Constitutional    Fever or chills:     PHYSICAL EXAM:   Vitals:   02/10/20 1503  BP: (!) 145/68  Pulse: 65  Resp: 20  Temp: 97.7 F (36.5 C)  SpO2: 98%  Weight: 270 lb (122.5 kg)  Height: 6' (1.829 m)    GENERAL: The patient is a well-nourished male, in no acute distress. The vital signs are documented above. CARDIOVASCULAR: There is a regular rate and rhythm. PULMONARY: There is good air exchange bilaterally without wheezing or  rales. VASCULAR: He has a brisk anterior tibial, peroneal, and posterior tibial signal with the Doppler. The anterior aspect of the wound looks well perfused.  There was some necrotic tissue on the more posterior aspect of the wound.  I did an excisional debridement of this in the office today.  This is very close to the bone. The wound measures 8 cm in length by 4-1/2 cm in width.   This picture was before debridement.  DATA:   No new data.  MEDICAL ISSUES:   EXTENSIVE LATERAL FOOT WOUND RIGHT LEG: This patient has a functioning femoropopliteal bypass and is also undergone tibial intervention.  The circulation is as good as we can get it.  He has a very extensive wound on the lateral aspect of the right foot that required excision of the fifth metatarsal.  The wound currently is very close to the bone and I think the only chance of this healing is to continue with the VAC.  I have done some excisional debridement in the office today and then will do wet-to-dry dressing changes until the [facility] is put back on on Friday (2 days).  I recommended that they use Mepitel because he has a lot of bleeding when the [facility] is changed because of the sponge adhering to the wound.  I plan on seeing him back in 4 weeks.  I explained that if the wound does not continue to show progress then he would likely require a below the knee amputation.  We have also discussed the importance of leg elevation and the proper positioning.  I think the leg swelling is also contributing to the slow progress and was also an issue with the back.  He has a lot of lymphatic seepage from the wound.  [name] [facility] [place] [phone]

## 2020-02-15 ENCOUNTER — Emergency Department (HOSPITAL_COMMUNITY): Payer: Medicare Other

## 2020-02-15 ENCOUNTER — Observation Stay (HOSPITAL_COMMUNITY)
Admission: EM | Admit: 2020-02-15 | Discharge: 2020-02-16 | Disposition: A | Discharge status: Home / Self Care | Payer: Medicare Other | Attending: Vascular Surgery | Admitting: Vascular Surgery

## 2020-02-15 ENCOUNTER — Encounter (HOSPITAL_COMMUNITY): Payer: Self-pay | Admitting: Emergency Medicine

## 2020-02-15 DIAGNOSIS — N189 Chronic kidney disease, unspecified: Secondary | ICD-10-CM | POA: Insufficient documentation

## 2020-02-15 DIAGNOSIS — I4891 Unspecified atrial fibrillation: Secondary | ICD-10-CM | POA: Insufficient documentation

## 2020-02-15 DIAGNOSIS — Z9889 Other specified postprocedural states: Secondary | ICD-10-CM | POA: Diagnosis not present

## 2020-02-15 DIAGNOSIS — Z881 Allergy status to other antibiotic agents status: Secondary | ICD-10-CM | POA: Insufficient documentation

## 2020-02-15 DIAGNOSIS — Z20822 Contact with and (suspected) exposure to covid-19: Secondary | ICD-10-CM | POA: Insufficient documentation

## 2020-02-15 DIAGNOSIS — E1122 Type 2 diabetes mellitus with diabetic chronic kidney disease: Secondary | ICD-10-CM | POA: Diagnosis not present

## 2020-02-15 DIAGNOSIS — I2510 Atherosclerotic heart disease of native coronary artery without angina pectoris: Secondary | ICD-10-CM | POA: Insufficient documentation

## 2020-02-15 DIAGNOSIS — Z794 Long term (current) use of insulin: Secondary | ICD-10-CM | POA: Diagnosis not present

## 2020-02-15 DIAGNOSIS — Z7901 Long term (current) use of anticoagulants: Secondary | ICD-10-CM | POA: Insufficient documentation

## 2020-02-15 DIAGNOSIS — E1151 Type 2 diabetes mellitus with diabetic peripheral angiopathy without gangrene: Secondary | ICD-10-CM | POA: Insufficient documentation

## 2020-02-15 DIAGNOSIS — Z800 Family history of malignant neoplasm of digestive organs: Secondary | ICD-10-CM | POA: Insufficient documentation

## 2020-02-15 DIAGNOSIS — L97518 Non-pressure chronic ulcer of other part of right foot with other specified severity: Secondary | ICD-10-CM | POA: Diagnosis not present

## 2020-02-15 DIAGNOSIS — R52 Pain, unspecified: Secondary | ICD-10-CM | POA: Diagnosis not present

## 2020-02-15 DIAGNOSIS — Z833 Family history of diabetes mellitus: Secondary | ICD-10-CM | POA: Insufficient documentation

## 2020-02-15 DIAGNOSIS — Z79899 Other long term (current) drug therapy: Secondary | ICD-10-CM | POA: Insufficient documentation

## 2020-02-15 DIAGNOSIS — Z955 Presence of coronary angioplasty implant and graft: Secondary | ICD-10-CM | POA: Insufficient documentation

## 2020-02-15 DIAGNOSIS — I10 Essential (primary) hypertension: Secondary | ICD-10-CM | POA: Diagnosis not present

## 2020-02-15 DIAGNOSIS — Z882 Allergy status to sulfonamides status: Secondary | ICD-10-CM | POA: Diagnosis not present

## 2020-02-15 DIAGNOSIS — Z7982 Long term (current) use of aspirin: Secondary | ICD-10-CM | POA: Diagnosis not present

## 2020-02-15 DIAGNOSIS — Z87891 Personal history of nicotine dependence: Secondary | ICD-10-CM | POA: Insufficient documentation

## 2020-02-15 DIAGNOSIS — Z950 Presence of cardiac pacemaker: Secondary | ICD-10-CM | POA: Diagnosis not present

## 2020-02-15 DIAGNOSIS — I499 Cardiac arrhythmia, unspecified: Secondary | ICD-10-CM | POA: Diagnosis not present

## 2020-02-15 DIAGNOSIS — M1990 Unspecified osteoarthritis, unspecified site: Secondary | ICD-10-CM | POA: Insufficient documentation

## 2020-02-15 DIAGNOSIS — R5381 Other malaise: Secondary | ICD-10-CM | POA: Diagnosis not present

## 2020-02-15 DIAGNOSIS — S91301A Unspecified open wound, right foot, initial encounter: Secondary | ICD-10-CM | POA: Diagnosis not present

## 2020-02-15 DIAGNOSIS — Z8249 Family history of ischemic heart disease and other diseases of the circulatory system: Secondary | ICD-10-CM | POA: Insufficient documentation

## 2020-02-15 DIAGNOSIS — Z89421 Acquired absence of other right toe(s): Secondary | ICD-10-CM | POA: Diagnosis not present

## 2020-02-15 DIAGNOSIS — E785 Hyperlipidemia, unspecified: Secondary | ICD-10-CM | POA: Diagnosis not present

## 2020-02-15 DIAGNOSIS — I739 Peripheral vascular disease, unspecified: Secondary | ICD-10-CM | POA: Diagnosis present

## 2020-02-15 DIAGNOSIS — I129 Hypertensive chronic kidney disease with stage 1 through stage 4 chronic kidney disease, or unspecified chronic kidney disease: Secondary | ICD-10-CM | POA: Insufficient documentation

## 2020-02-15 DIAGNOSIS — Z89422 Acquired absence of other left toe(s): Secondary | ICD-10-CM | POA: Insufficient documentation

## 2020-02-15 DIAGNOSIS — L089 Local infection of the skin and subcutaneous tissue, unspecified: Secondary | ICD-10-CM

## 2020-02-15 DIAGNOSIS — Z841 Family history of disorders of kidney and ureter: Secondary | ICD-10-CM | POA: Insufficient documentation

## 2020-02-15 DIAGNOSIS — Z03818 Encounter for observation for suspected exposure to other biological agents ruled out: Secondary | ICD-10-CM | POA: Diagnosis not present

## 2020-02-15 LAB — CBC WITH DIFFERENTIAL/PLATELET
Abs Immature Granulocytes: 0.02 10*3/uL (ref 0.00–0.07)
Basophils Absolute: 0.1 10*3/uL (ref 0.0–0.1)
Basophils Relative: 1 %
Eosinophils Absolute: 0.2 10*3/uL (ref 0.0–0.5)
Eosinophils Relative: 3 %
HCT: 30.1 % — ABNORMAL LOW (ref 39.0–52.0)
Hemoglobin: 9.4 g/dL — ABNORMAL LOW (ref 13.0–17.0)
Immature Granulocytes: 0 %
Lymphocytes Relative: 19 %
Lymphs Abs: 1.3 10*3/uL (ref 0.7–4.0)
MCH: 28.2 pg (ref 26.0–34.0)
MCHC: 31.2 g/dL (ref 30.0–36.0)
MCV: 90.4 fL (ref 80.0–100.0)
Monocytes Absolute: 0.8 10*3/uL (ref 0.1–1.0)
Monocytes Relative: 11 %
Neutro Abs: 4.9 10*3/uL (ref 1.7–7.7)
Neutrophils Relative %: 66 %
Platelets: 258 10*3/uL (ref 150–400)
RBC: 3.33 MIL/uL — ABNORMAL LOW (ref 4.22–5.81)
RDW: 15.2 % (ref 11.5–15.5)
WBC: 7.3 10*3/uL (ref 4.0–10.5)
nRBC: 0 % (ref 0.0–0.2)

## 2020-02-15 LAB — COMPREHENSIVE METABOLIC PANEL
ALT: 18 U/L (ref 0–44)
AST: 16 U/L (ref 15–41)
Albumin: 2.9 g/dL — ABNORMAL LOW (ref 3.5–5.0)
Alkaline Phosphatase: 77 U/L (ref 38–126)
Anion gap: 13 (ref 5–15)
BUN: 77 mg/dL — ABNORMAL HIGH (ref 8–23)
CO2: 19 mmol/L — ABNORMAL LOW (ref 22–32)
Calcium: 8.7 mg/dL — ABNORMAL LOW (ref 8.9–10.3)
Chloride: 96 mmol/L — ABNORMAL LOW (ref 98–111)
Creatinine, Ser: 2.79 mg/dL — ABNORMAL HIGH (ref 0.61–1.24)
GFR calc Af Amer: 27 mL/min — ABNORMAL LOW (ref >60)
GFR calc non Af Amer: 23 mL/min — ABNORMAL LOW (ref >60)
Glucose, Bld: 328 mg/dL — ABNORMAL HIGH (ref 70–99)
Potassium: 4.3 mmol/L (ref 3.5–5.1)
Sodium: 128 mmol/L — ABNORMAL LOW (ref 135–145)
Total Bilirubin: 0.7 mg/dL (ref 0.3–1.2)
Total Protein: 6.8 g/dL (ref 6.5–8.1)

## 2020-02-15 LAB — LACTIC ACID, PLASMA: Lactic Acid, Venous: 1 mmol/L (ref 0.5–1.9)

## 2020-02-15 NOTE — ED Notes (Signed)
[phone] Alexander [name] daughter please call for an update.

## 2020-02-15 NOTE — ED Triage Notes (Signed)
Pt reports right sided foot pain. Pt has wound vac to foot for a week. Sent by home health RN due to a new spot on foot and worried about cellulitis.

## 2020-02-16 LAB — CBG MONITORING, ED
Glucose-Capillary: 227 mg/dL — ABNORMAL HIGH (ref 70–99)
Glucose-Capillary: 217 mg/dL — ABNORMAL HIGH (ref 70–99)

## 2020-02-16 LAB — SARS CORONAVIRUS 2 (TAT 6-24 HRS): SARS Coronavirus 2: NEGATIVE

## 2020-02-16 MED ORDER — OXYCODONE-ACETAMINOPHEN 5-325 MG PO TABS: 1.0000 | ORAL_TABLET | Freq: Four times a day (QID) | ORAL | Status: DC | PRN

## 2020-02-16 MED ORDER — APIXABAN 5 MG PO TABS: 5.0000 mg | ORAL_TABLET | Freq: Two times a day (BID) | ORAL | Status: DC

## 2020-02-16 MED ORDER — FUROSEMIDE 20 MG PO TABS
40.0000 mg | ORAL_TABLET | Freq: Two times a day (BID) | ORAL | Status: DC
  Administered 2020-02-16: 40 mg via ORAL
  Filled 2020-02-16: qty 2

## 2020-02-16 MED ORDER — PRAZOSIN HCL 2 MG PO CAPS
2.0000 mg | ORAL_CAPSULE | Freq: Every day | ORAL | Status: DC
  Administered 2020-02-16: 2 mg via ORAL
  Filled 2020-02-16: qty 1

## 2020-02-16 MED ORDER — DOCUSATE SODIUM 100 MG PO CAPS: 100.0000 mg | ORAL_CAPSULE | Freq: Every day | ORAL | Status: DC

## 2020-02-16 MED ORDER — SODIUM CHLORIDE 0.9 % IV BOLUS
500.0000 mL | Freq: Once | INTRAVENOUS | Status: AC
  Administered 2020-02-16: 500 mL via INTRAVENOUS

## 2020-02-16 MED ORDER — ACETAMINOPHEN 325 MG PO TABS: 325.0000 mg | ORAL_TABLET | ORAL | Status: DC | PRN

## 2020-02-16 MED ORDER — LABETALOL HCL 5 MG/ML IV SOLN: 10.0000 mg | INTRAVENOUS | Status: DC | PRN

## 2020-02-16 MED ORDER — INSULIN ASPART 100 UNIT/ML ~~LOC~~ SOLN: 0.0000 [IU] | Freq: Every day | SUBCUTANEOUS | Status: DC

## 2020-02-16 MED ORDER — LISINOPRIL 10 MG PO TABS: 10.0000 mg | ORAL_TABLET | Freq: Every day | ORAL | Status: DC

## 2020-02-16 MED ORDER — INSULIN ASPART 100 UNIT/ML ~~LOC~~ SOLN
0.0000 [IU] | Freq: Three times a day (TID) | SUBCUTANEOUS | Status: DC
  Administered 2020-02-16 (×2): 7 [IU] via SUBCUTANEOUS

## 2020-02-16 MED ORDER — SODIUM CHLORIDE 0.9 % IV SOLN: 500.0000 mL | Freq: Once | INTRAVENOUS | Status: DC | PRN

## 2020-02-16 MED ORDER — ONDANSETRON HCL 4 MG/2ML IJ SOLN: 4.0000 mg | Freq: Four times a day (QID) | INTRAMUSCULAR | Status: DC | PRN

## 2020-02-16 MED ORDER — HYDRALAZINE HCL 25 MG PO TABS
100.0000 mg | ORAL_TABLET | Freq: Three times a day (TID) | ORAL | Status: DC
  Administered 2020-02-16: 100 mg via ORAL
  Filled 2020-02-16: qty 4

## 2020-02-16 MED ORDER — ATORVASTATIN CALCIUM 10 MG PO TABS
20.0000 mg | ORAL_TABLET | Freq: Every day | ORAL | Status: DC
  Administered 2020-02-16: 20 mg via ORAL
  Filled 2020-02-16: qty 2

## 2020-02-16 MED ORDER — ALUM & MAG HYDROXIDE-SIMETH 200-200-20 MG/5ML PO SUSP: 15.0000 mL | ORAL | Status: DC | PRN

## 2020-02-16 MED ORDER — CIPROFLOXACIN HCL 250 MG PO TABS: 250.0000 mg | ORAL_TABLET | Freq: Two times a day (BID) | ORAL | 0 refills | Status: DC

## 2020-02-16 MED ORDER — METOPROLOL TARTRATE 5 MG/5ML IV SOLN: 2.0000 mg | INTRAVENOUS | Status: DC | PRN

## 2020-02-16 MED ORDER — PANTOPRAZOLE SODIUM 40 MG PO TBEC
40.0000 mg | DELAYED_RELEASE_TABLET | Freq: Every day | ORAL | Status: DC
  Administered 2020-02-16: 40 mg via ORAL
  Filled 2020-02-16: qty 1

## 2020-02-16 MED ORDER — MAGNESIUM SULFATE 2 GM/50ML IV SOLN: 2.0000 g | Freq: Every day | INTRAVENOUS | Status: DC | PRN

## 2020-02-16 MED ORDER — HYDRALAZINE HCL 20 MG/ML IJ SOLN: 5.0000 mg | INTRAMUSCULAR | Status: DC | PRN

## 2020-02-16 MED ORDER — ASPIRIN EC 81 MG PO TBEC
81.0000 mg | DELAYED_RELEASE_TABLET | Freq: Every day | ORAL | Status: DC
  Administered 2020-02-16: 09:00:00 81 mg via ORAL
  Filled 2020-02-16: qty 1

## 2020-02-16 MED ORDER — METOPROLOL TARTRATE 25 MG PO TABS
100.0000 mg | ORAL_TABLET | Freq: Two times a day (BID) | ORAL | Status: DC
  Administered 2020-02-16: 100 mg via ORAL
  Filled 2020-02-16: qty 4

## 2020-02-16 MED ORDER — ACETAMINOPHEN 325 MG RE SUPP: 325.0000 mg | RECTAL | Status: DC | PRN

## 2020-02-16 MED ORDER — POTASSIUM CHLORIDE CRYS ER 20 MEQ PO TBCR: 20.0000 meq | EXTENDED_RELEASE_TABLET | Freq: Every day | ORAL | Status: DC | PRN

## 2020-02-16 NOTE — ED Notes (Signed)
Vascular at bedside

## 2020-02-16 NOTE — ED Notes (Signed)
PA at bedside.

## 2020-02-16 NOTE — H&P (View-Only) (Signed)
VASCULAR SURGERY:  I had a long discussion with [name] in the emergency department this morning. I do not think that this wound is likely salvageable. When I debrided the wound last Wednesday he had good granulation tissue but this is already all dried out with dry gangrene. I think further debridement will result in a similar situation with more of the foot involved. For this reason I think he will require a below the knee amputation. He does have a functioning bypass on this side. The patient feels strongly that he would like one more attempt at debridement. I explained that this would be quite extensive and we may have to take the third metatarsal and thus he would require a third toe amputation and placement of a VAC. He would like to attempt this before proceeding with below the knee amputation. We will discharge him today on Cipro and he knows to hold his Eliquis. He will come back on Friday for surgery.  [name], MD Office: [phone]

## 2020-02-16 NOTE — ED Notes (Signed)
Pt was seen by Dr. Scot Dock & pt is to return to the hospital on Friday for surgery regarding his Rt foot wound. This RN wrapped/covered his wound as Dr. Scot Dock ordered/requested, & then given a [place] sandwich/drink.. Pt called his wife and arranged his transport home after D/C. This RN explained D/C instructions, pt expressed/verbalized understanding. PIV removed from [facility], assisted into a w/c & brought out to his wife's car.

## 2020-02-16 NOTE — ED Notes (Signed)
covid swab collected, labeled with 2 pt identifiers, and brought to lab 

## 2020-02-16 NOTE — Progress Notes (Signed)
VASCULAR SURGERY:  I had a long discussion with Alexander [name] in the emergency department this morning. I do not think that this wound is likely salvageable. When I debrided the wound last Wednesday he had good granulation tissue but this is already all dried out with dry gangrene. I think further debridement will result in a similar situation with more of the foot involved. For this reason I think he will require a below the knee amputation. He does have a functioning bypass on this side. The patient feels strongly that he would like one more attempt at debridement. I explained that this would be quite extensive and we may have to take the third metatarsal and thus he would require a third toe amputation and placement of a VAC. He would like to attempt this before proceeding with below the knee amputation. We will discharge him today on Cipro and he knows to hold his Eliquis. He will come back on Friday for surgery.  [name], MD Office: [phone]

## 2020-02-16 NOTE — ED Notes (Signed)
PIV initiated, 20G to [place]. IV flushes with 10 cc NS without s/s of infiltration. Positive blood return noted. Secured with tape and tegaderm. Fluid bolus initiated per MAR. Name/DOB verified with pt

## 2020-02-16 NOTE — ED Notes (Signed)
Report given to Leslie, RN.

## 2020-02-16 NOTE — ED Provider Notes (Signed)
[facility] EMERGENCY DEPARTMENT Provider Note   CSN: [id number] Arrival date & time: 02/15/20  1741     History Chief Complaint  Patient presents with  . Wound Infection    Alexander [name] is a 59 y.o. male with a history of PVD who has undergone previous right femoral-popliteal bypass and also tibial interventions, diabetes mellitus, A. fib, sick sinus syndrome s/p pacemaker, and CKD.   In brief, the patient presented with a gangrenous wound to the lateral aspect of his right ankle that progressed despite aggressive outpatient management.  He underwent debridement on 3/16 with Dr. Scot Dock with vascular surgery that included debridement of bone, subcutaneous tissue, skin, and fascia.  The fifth metatarsal was excised.  He had a wound VAC placed.  He was discharged on 3/18 with a 7-day course of ciprofloxacin.    He was last seen by vascular surgery on 03/31.  Wound VAC had been removed 2 days prior to the visit by home health nurse due to swelling and seeping from the skin.  The wound VAC dressing was changed on 04/2.  When the patient's home health nurse came to change the dressing today (4/5), she was concerned about a new ulceration and was worried that the patient was developing a cellulitis.  The patient denies any fevers or chills.  He reports that he does have pain when he bears weight on the foot, but he is unsure if pain is increased in the last few days.  He is a poor historian and is unable to tell me if the foot and lower leg are more significantly red, warm, or swollen. Spoke with the patient's daughter [name] who reports that the patient was complaining of worsening pain in the right foot yesterday to his mother.   He denies new numbness, weakness, cough, shortness of breath, chest pain, or pain in the right calf or thigh.  The history is provided by the patient. No language interpreter was used.       Past Medical History:  Diagnosis Date  . Alcohol abuse    . Anemia   . Arthritis    "patient does not think so."  . Atrial fibrillation ([place])   . Cardiac syncope 10/07/14   rapid A fib with 8 sec pauses on converison with syncope- temp pacing wire placed then PPM  . Cataract    BILATERAL  . Chronic kidney disease   . Coronary artery disease   . Diabetes mellitus    dx---been  awhile  . Encounter for care of pacemaker 10/08/2014  . History of blood transfusion   . Hyperlipemia   . Hypertension   . Peripheral vascular disease ([place])   . Presence of permanent cardiac pacemaker 10/08/2014  . Sinus node dysfunction ([place]) 10/08/2014    Patient Active Problem List   Diagnosis Date Noted  . Heel ulcer ([place]) 11/05/2019  . Atherosclerotic PVD with ulceration ([place]) 09/09/2017  . Essential hypertension, benign 02/07/2015  . Renal insufficiency 02/07/2015  . Post PTCA 11/09/2014  . S/P PTCA (percutaneous transluminal coronary angioplasty) 11/09/2014  . Pacemaker: Anderson DR MRI [id number] Dual chamber pacemaker 10/08/2014 10/08/2014  . Encounter for care of pacemaker 10/08/2014  . Sinus node dysfunction ([place]) 10/08/2014  . Cardiac asystole ([place]) 10/07/2014  . Cardiac syncope 10/07/2014  . Diabetes mellitus with stage 3 chronic kidney disease ([place]) 10/07/2014  . CAD (coronary artery disease), native coronary artery 10/07/2014  . Diabetes mellitus due to underlying condition without complications ([place]) 86/[id number]/8469  .  Anemia, unspecified 05/25/2014  . Syncope 04/30/2014  . Atherosclerosis of native arteries of the extremities with gangrene ([place]) 12/16/2013  . Volume overload 10/30/2013  . Acute renal failure ([place]) 10/28/2013  . PAD (peripheral artery disease) ([place]) 10/27/2013  . Leukocytosis 10/21/2013  . Anemia 10/21/2013  . Osteomyelitis of left foot ([place]) 08/19/2013  . Spinal stenosis, lumbar region, with neurogenic claudication 06/12/2012    Class: Diagnosis of  . DIABETES MELLITUS, TYPE II 03/15/2009  . ALCOHOL ABUSE 03/15/2009    . TOBACCO USE 03/15/2009  . HYPERTENSION 03/15/2009    Past Surgical History:  Procedure Laterality Date  . ABDOMINAL AORTOGRAM W/LOWER EXTREMITY N/A 09/09/2017   Procedure: ABDOMINAL AORTOGRAM W/LOWER EXTREMITY;  Surgeon: Angelia Mould, MD;  Location: [facility];  Service: Cardiovascular;  Laterality: N/A;  . ABDOMINAL AORTOGRAM W/LOWER EXTREMITY Right 11/11/2019   Procedure: ABDOMINAL AORTOGRAM W/LOWER EXTREMITY;  Surgeon: Marty Heck, MD;  Location: [facility];  Service: Cardiovascular;  Laterality: Right;  . AMPUTATION Left 08/19/2013   Procedure: AMPUTATION RAY;  Surgeon: Alta Corning, MD;  Location: [facility];  Service: Orthopedics;  Laterality: Left;  ray amputation left 5th  . AMPUTATION Left 10/27/2013   Procedure: AMPUTATION DIGIT-LEFT 4TH TOE, 4th and 5th metatarsal.;  Surgeon: Angelia Mould, MD;  Location: [facility];  Service: Vascular;  Laterality: Left;  . AMPUTATION Left 11/04/2013   Procedure: LEFT FOOT TRANS-METATARSAL AMPUTATION WITH WOUND CLOSURE ;  Surgeon: Alta Corning, MD;  Location: [facility];  Service: Orthopedics;  Laterality: Left;  . AMPUTATION Right 09/10/2017   Procedure: RIGHT FOURTH AND FIFTH TOE AMPUTATION;  Surgeon: Angelia Mould, MD;  Location: [facility];  Service: Vascular;  Laterality: Right;  . CARDIAC CATHETERIZATION  10/07/2014   Procedure: LEFT HEART CATH AND CORONARY ANGIOGRAPHY;  Surgeon: Laverda Page, MD;  Location: [facility] C[facility] LAB;  Service: Cardiovascular;;  . COLONOSCOPY W/ POLYPECTOMY    . EMBOLECTOMY Right 09/12/2017   Procedure: Thrombectomy  & Redo Right Below Knee Popliteal Artery Bypass Graft  ;  Surgeon: Waynetta Sandy, MD;  Location: [facility];  Service: Vascular;  Laterality: Right;  . EYE SURGERY Bilateral    cataract  . FEMORAL-TIBIAL BYPASS GRAFT Left 10/27/2013   Procedure: BYPASS GRAFT LEFT FEMORAL- POSTERIOR TIBIAL ARTERY;  Surgeon: Angelia Mould, MD;  Location: [facility];  Service:  Vascular;  Laterality: Left;  . FEMORAL-TIBIAL BYPASS GRAFT Right 09/10/2017   Procedure: RIGHT SUPERFICIAL  FEMORAL ARTERY-BELOW KNEE POPLITEAL ARTERY BYPASS GRAFT WITH VEIN;  Surgeon: Angelia Mould, MD;  Location: [facility];  Service: Vascular;  Laterality: Right;  . I & D EXTREMITY Left 10/27/2013   Procedure: IRRIGATION AND DEBRIDEMENT EXTREMITY- LEFT FOOT;  Surgeon: Angelia Mould, MD;  Location: [facility];  Service: Vascular;  Laterality: Left;  . I & D EXTREMITY Right 01/26/2020   Procedure: IRRIGATION AND DEBRIDEMENT EXTREMITY RIGHT FOOT WOUND;  Surgeon: Angelia Mould, MD;  Location: [facility];  Service: Vascular;  Laterality: Right;  . INTRAOPERATIVE ARTERIOGRAM Right 09/10/2017   Procedure: INTRA OPERATIVE ARTERIOGRAM;  Surgeon: Angelia Mould, MD;  Location: [facility];  Service: Vascular;  Laterality: Right;  . IR ANGIOGRAM FOLLOW UP STUDY  09/09/2017  . LEFT HEART CATHETERIZATION WITH CORONARY ANGIOGRAM N/A 11/09/2014   Procedure: LEFT HEART CATHETERIZATION WITH CORONARY ANGIOGRAM;  Surgeon: Laverda Page, MD;  Location: [facility] C[facility] LAB;  Service: Cardiovascular;  Laterality: N/A;  . LOWER EXTREMITY ANGIOGRAM Right 11/08/2015   Procedure:  Lower Extremity Angiogram;  Surgeon: Serafina Mitchell, MD;  Location: [facility];  Service: Cardiovascular;  Laterality: Right;  . LUMBAR LAMINECTOMY  06/13/2012   Procedure: MICRODISCECTOMY LUMBAR LAMINECTOMY;  Surgeon: Jessy Oto, MD;  Location: [facility];  Service: Orthopedics;  Laterality: N/A;  Central laminectomy L2-3, L3-4, L4-5  . PACEMAKER INSERTION  10/08/14   MDT Advisa MRI compatible dual chamber pacemaker implanted by Dr Caryl Comes for syncope with post-termination pauses  . PERCUTANEOUS CORONARY STENT INTERVENTION (PCI-S)  11/09/2014   des to lad & distal circumflex         Dr  [name]  . PERIPHERAL VASCULAR BALLOON ANGIOPLASTY Right 11/11/2019   Procedure: PERIPHERAL VASCULAR BALLOON ANGIOPLASTY;  Surgeon: Marty Heck, MD;  Location: [facility];  Service: Cardiovascular;  Laterality: Right;  below knee popliteal, tibioperoneal trunk, posterior tibial arteries  . PERIPHERAL VASCULAR CATHETERIZATION N/A 11/08/2015   Procedure: Abdominal Aortogram;  Surgeon: Serafina Mitchell, MD;  Location: [facility];  Service: Cardiovascular;  Laterality: N/A;  . PERIPHERAL VASCULAR CATHETERIZATION Right 11/08/2015   Procedure: Peripheral Vascular Atherectomy;  Surgeon: Serafina Mitchell, MD;  Location: [facility];  Service: Cardiovascular;  Laterality: Right;  . PERIPHERAL VASCULAR CATHETERIZATION N/A 10/10/2016   Procedure: Lower Extremity Angiography;  Surgeon: Waynetta Sandy, MD;  Location: [facility];  Service: Cardiovascular;  Laterality: N/A;  . PERIPHERAL VASCULAR CATHETERIZATION Right 10/10/2016   Procedure: Peripheral Vascular Atherectomy;  Surgeon: Waynetta Sandy, MD;  Location: [facility];  Service: Cardiovascular;  Laterality: Right;  Popliteal  . PERMANENT PACEMAKER INSERTION N/A 10/08/2014   Procedure: PERMANENT PACEMAKER INSERTION;  Surgeon: Deboraha Sprang, MD;  Location[facility] C[facility];  Service: Cardiovascular;  Laterality: N/A;  . TEMPORARY PACEMAKER INSERTION Bilateral 10/07/2014   Procedure: TEMPORARY PACEMAKER INSERTION;  Surgeon: Laverda Page, MD;  Location: [facility] CATH LAB;  Service: Cardiovascular;  Laterality: Bilateral;       Family History  Problem Relation Age of Onset  . Diabetes type II Mother   . Hypertension Mother   . Diabetes Mother   . Liver cancer Father   . Diabetes type II Sister   . Breast cancer Sister   . Diabetes Sister   . Hypertension Sister   . Diabetes type II Brother   . Kidney failure Brother   . Diabetes Brother   . Hypertension Brother   . Diabetes type II Sister     Social History   Tobacco Use  . Smoking status: Former Smoker    Packs/day: 1.50    Years: 30.00    Pack years: 45.00     Types: Cigars    Quit date: 10/07/2014    Years since quitting: 5.3  . Smokeless tobacco: Never Used  Substance Use Topics  . Alcohol use: Yes    Alcohol/week: 6.0 standard drinks    Types: 6 Cans of beer per week  . Drug use: No    Home Medications Prior to Admission medications   Medication Sig Start Date End Date Taking? Authorizing Provider  apixaban (ELIQUIS) 5 MG TABS tablet Take 1 tablet (5 mg total) by mouth 2 (two) times daily. 05/29/19  Yes Miquel Dunn, NP  aspirin 81 MG EC tablet Take 81 mg by mouth daily.    Yes [provider]  atorvastatin (LIPITOR) 20 MG tablet Take 1 tablet by mouth once daily Patient taking differently: Take 20 mg by mouth daily.  12/24/19  Yes Miquel Dunn, NP  furosemide (LASIX) 40 MG tablet Take 40 mg by mouth 2 (two) times daily.   Yes [provider]  hydrALAZINE (APRESOLINE) 100 MG tablet TAKE 1 TABLET BY MOUTH THREE TIMES DAILY Patient taking differently: Take 100 mg by mouth 2 (two) times daily.  09/25/19  Yes Miquel Dunn, NP  Insulin NPH Isophane & Regular (NOVOLIN 70/30 [place]) Inject 46-56 Units into the skin See admin instructions. Inject 56 units subcutaneously in the morning & inject 46 units subcutaneously in the evening.   Yes [provider]  lisinopril (ZESTRIL) 10 MG tablet Take 1 tablet by mouth once daily Patient taking differently: Take 10 mg by mouth daily.  12/24/19  Yes Miquel Dunn, NP  metoprolol tartrate (LOPRESSOR) 100 MG tablet Take 1 tablet by mouth twice daily Patient taking differently: Take 100 mg by mouth 2 (two) times daily.  01/27/20  Yes Miquel Dunn, NP  Multiple Vitamin (MULTIVITAMIN WITH MINERALS) TABS tablet Take 1 tablet by mouth daily.   Yes [provider]  oxyCODONE-acetaminophen (PERCOCET) 5-325 MG tablet Take 1 tablet by mouth every 6 (six) hours as needed. Patient taking differently: Take 1 tablet by mouth every 6 (six) hours as  needed for moderate pain.  01/28/20  Yes Rhyne, Samantha J, PA-C  prazosin (MINIPRESS) 2 MG capsule TAKE 1 CAPSULE BY MOUTH EVERY  EVENING AFTER SUPPER Patient taking differently: Take 2 mg by mouth daily.  11/25/19  Yes Adrian Prows, MD  ACCU-CHEK FASTCLIX LANCETS MISC Check blood sugar TID & QHS 10/01/14   Lorayne Marek, MD  blood glucose meter kit and supplies KIT Check blood sugar TID & QHS 02/10/15   Advani, Vernon Prey, MD  Blood Glucose Monitoring Suppl (ACCU-CHEK ADVANTAGE DIABETES) kit Use as instructed 10/01/14   Lorayne Marek, MD  clopidogrel (PLAVIX) 75 MG tablet Take 1 tablet (75 mg total) by mouth daily. 11/11/19 11/10/20  Marty Heck, MD  glucose blood (ACCU-CHEK AVIVA PLUS) test strip Use as instructed 10/01/14   Lorayne Marek, MD  glucose blood (CHOICE DM FORA G20 TEST STRIPS) test strip Check blood sugar TID & QHS 02/10/15   Advani, Vernon Prey, MD  glucose blood test strip Use as instructed 11/20/13   Reyne Dumas, MD    Allergies    Keflex [cephalexin], Pletal [cilostazol], and Sulfa antibiotics  Review of Systems   Review of Systems  Constitutional: Negative for appetite change, chills and fever.  HENT: Negative for congestion and sore throat.   Respiratory: Negative for apnea, cough, shortness of breath and wheezing.   Cardiovascular: Negative for chest pain and palpitations.  Gastrointestinal: Negative for abdominal pain, blood in stool, diarrhea, nausea and vomiting.  Genitourinary: Negative for discharge, dysuria, frequency, genital sores, penile pain and urgency.  Musculoskeletal: Positive for gait problem. Negative for arthralgias, back pain, joint swelling, myalgias, neck pain and neck stiffness.  Skin: Positive for color change and wound. Negative for rash.  Allergic/Immunologic: Negative for immunocompromised state.  Neurological: Negative for dizziness, weakness, numbness and headaches.  Psychiatric/Behavioral: Negative for confusion.    Physical Exam Updated  Vital Signs BP (!) 148/69   Pulse 63   Temp 98 F (36.7 C) (Oral)   Resp 17   SpO2 98%   Physical Exam Vitals and nursing note reviewed.  Constitutional:      Appearance: He is well-developed.     Comments: Chronically ill appearing. NAD.  Nontoxic appearing.  HENT:     Head: Normocephalic.  Eyes:     Conjunctiva/sclera: Conjunctivae normal.  Cardiovascular:     Rate and Rhythm: Normal rate and regular rhythm.     Heart sounds: No murmur.  Pulmonary:     Effort: Pulmonary effort is normal.  Abdominal:     General: There is no distension.     Palpations: Abdomen is soft.  Musculoskeletal:     Cervical back: Neck supple.     Comments: Diffuse erythema and warmth noted to the right foot.  The foot, ankle, and right lower leg are edematous.  Muscular compartments are soft.  He has neurovascularly intact.  Wound VAC is in place.   When wound VAC is removed, there is a strong malodorous odor coming from the right foot.  There appears to be a developing gangrenous area into the right lateral wound.  The inferior portion of the wound appears to be friable with controlled bleeding.  Patient does note he has noticed more bleeding from this portion of the wound over the last few days.  There is also a developing ulceration noted to the dorsum of the foot.  Skin:    General: Skin is warm and dry.  Neurological:     Mental Status: He is alert.  Psychiatric:        Behavior: Behavior normal.   Dorsum of the right foot  Right lateral foot and ankle   Right medial ankle         ED Results / Procedures / Treatments   Labs (all labs ordered are listed, but only abnormal results are displayed) Labs Reviewed  COMPREHENSIVE METABOLIC PANEL - Abnormal; Notable for the following components:      Result Value   Sodium 128 (*)    Chloride 96 (*)    CO2 19 (*)    Glucose, Bld 328 (*)    BUN 77 (*)    Creatinine, Ser 2.79 (*)    Calcium 8.7 (*)    Albumin 2.9 (*)    GFR calc non  Af Amer 23 (*)    GFR calc Af Amer 27 (*)    All other components within normal limits  CBC WITH DIFFERENTIAL/PLATELET - Abnormal; Notable for the following components:   RBC 3.33 (*)    Hemoglobin 9.4 (*)    HCT 30.1 (*)    All other components within normal limits  SARS CORONAVIRUS 2 (TAT 6-24 HRS)  LACTIC ACID, PLASMA    EKG None  Radiology DG Foot Complete Right  Result Date: 02/15/2020 CLINICAL DATA:  Infection.  Wound will not heal. EXAM: RIGHT FOOT COMPLETE - 3+ VIEW COMPARISON:  11/16/2016 FINDINGS: 18 prior amputation of the 5th toe including the 5th metatarsal and the 4th toe at the MTP joint. Soft tissue gas noted in the lateral soft tissues. Soft tissue swelling. No current bone destruction to suggest acute osteomyelitis. No fracture, subluxation or dislocation. IMPRESSION: Prior amputation as above. No evidence for current acute osteomyelitis. Electronically Signed   By: Rolm Baptise M.D.   On: 02/15/2020 18:40    Procedures Procedures (including critical care time)  Medications Ordered in ED Medications  aspirin EC tablet 81 mg (has no administration in time range)  oxyCODONE-acetaminophen (PERCOCET/ROXICET) 5-325 MG per tablet 1 tablet (has no administration in time range)  atorvastatin (LIPITOR) tablet 20 mg (has no administration in time range)  furosemide (LASIX) tablet 40 mg (has no administration in time range)  hydrALAZINE (APRESOLINE) tablet 100 mg (has no administration in time range)  metoprolol  tartrate (LOPRESSOR) tablet 100 mg (has no administration in time range)  prazosin (MINIPRESS) capsule 2 mg (has no administration in time range)  0.9 %  sodium chloride infusion (has no administration in time range)  magnesium sulfate IVPB 2 g 50 mL (has no administration in time range)  potassium chloride SA (KLOR-CON) CR tablet 20-40 mEq (has no administration in time range)  acetaminophen (TYLENOL) tablet 325-650 mg (has no administration in time range)    Or    acetaminophen (TYLENOL) suppository 325-650 mg (has no administration in time range)  docusate sodium (COLACE) capsule 100 mg (has no administration in time range)  ondansetron (ZOFRAN) injection 4 mg (has no administration in time range)  alum & mag hydroxide-simeth (MAALOX/MYLANTA) 200-200-20 MG/5ML suspension 15-30 mL (has no administration in time range)  pantoprazole (PROTONIX) EC tablet 40 mg (has no administration in time range)  labetalol (NORMODYNE) injection 10 mg (has no administration in time range)  hydrALAZINE (APRESOLINE) injection 5 mg (has no administration in time range)  metoprolol tartrate (LOPRESSOR) injection 2-5 mg (has no administration in time range)  insulin aspart (novoLOG) injection 0-20 Units (has no administration in time range)  insulin aspart (novoLOG) injection 0-5 Units (has no administration in time range)  sodium chloride 0.9 % bolus 500 mL (0 mLs Intravenous Stopped 02/16/20 0430)    ED Course  I have reviewed the triage vital signs and the nursing notes.  Pertinent labs & imaging results that were available during my care of the patient were reviewed by me and considered in my medical decision making (see chart for details).    MDM Rules/Calculators/A&P                      59 year old male with a history of PVD who has undergone previous right femoral-popliteal bypass and also tibial interventions, diabetes mellitus, A. fib, sick sinus syndrome s/p pacemaker, and CKD.  The patient has a wound VAC in place after undergoing debridement with Dr. Scot Dock x2 over the last month.  He was sent to the ER by his home health nurse after his wound VAC dressing was changed today and changes were noted to the debrided wound on the right lateral foot.  There also appear to be a new ulceration noted to the dorsum of the foot.  He is afebrile.  He was initially sent home with a 7-day course of ciprofloxacin that he finished on approximately 325.  No other recent  antibiotic administration.  He has no leukocytosis.  On exam, the right foot does appear erythematous and warm to the touch.  He is neurovascularly intact.  When wound VAC is removed, there does appear to be a developing gangrenous area to the debrided area on the right lateral foot.  There is also a new ulceration developing to the dorsum of the foot.  Patient's medical record was reviewed.  Glucose is elevated at 328.  Bicarb is 19, which appears chronic.  Anion gap is normal.  Patient reports that he has not taken his nighttime dose of insulin.  He also has a new mild hyponatremia of 128.  We will treat with 500 cc fluids.  Will hold his nighttime medications until he is seen by vascular surgery.  Labs are otherwise reassuring.  Hemoglobin is unchanged from previous.  He has no leukocytosis.  X-ray of the right foot with soft tissue gas noted in the lateral soft tissues, which is consistent with where his wound VAC is.  No  evidence of acute osteomyelitis on my review.  As the patient is not septic at this time, will hold antibiotics until he has been evaluated by vascular surgery. Discussed with Dr. Leonides Schanz, attending physician.   Consulted vascular surgery and Dr. Carlis Abbott will come to evaluate the patient in the ER.  Consult appreciated.  I discussed with the patient at bedside the changes to his wound and if amputation were required would he be agreeable.  Patient states that he would undergo what ever procedure was needed for treatment of his wound, including amputation.  Patient's wishes were discussed with Dr. Carlis Abbott.  Dr. Carlis Abbott is seen the patient and will accept him for admission. The patient appears reasonably stabilized for admission considering the current resources, flow, and capabilities available in the ED at this time, and I doubt any other [facility] requiring further screening and/or treatment in the ED prior to admission.  Final Clinical Impression(s) / ED Diagnoses Final diagnoses:  Right foot  infection    Rx / DC Orders ED Discharge Orders    None       Summer Mccolgan A, PA-C 02/16/20 [place], Delice Bison, DO 02/16/20 1962

## 2020-02-16 NOTE — H&P (Addendum)
Reason for Consult:  Progression right foot tissue loss Referring Physician:  ED MRN #:  [id number]  History of Present Illness: This is a 59 y.o. male with hx DM, afib on eliquis, CKD, HTN, HLD and PVD that presents for evaluation of progressive tissue loss right lower extremity. HE had a non-healing right diabetic foot wound and previously underwent right SFA  To below knee popliteal artery bypass on 09/10/17 by Dr. Scot Dock.  This thrombosed and required re-do by Dr. Donzetta Matters on 09/12/17.  Most recently underwent right BK pop, TP trunk, and PT angioplasty by myself on 11/01/19.   States home health nurse went to change the [facility] and was very concerned about the appearance of the wound.  Ultimately was sent to the ED by home health.  States having a lot of pain in the foot when he walks.  His last Eliquis dose was yesterday morning.  Past Medical History:  Diagnosis Date  . Alcohol abuse   . Anemia   . Arthritis    "patient does not think so."  . Atrial fibrillation ([place])   . Cardiac syncope 10/07/14   rapid A fib with 8 sec pauses on converison with syncope- temp pacing wire placed then PPM  . Cataract    BILATERAL  . Chronic kidney disease   . Coronary artery disease   . Diabetes mellitus    dx---been  awhile  . Encounter for care of pacemaker 10/08/2014  . History of blood transfusion   . Hyperlipemia   . Hypertension   . Peripheral vascular disease ([place])   . Presence of permanent cardiac pacemaker 10/08/2014  . Sinus node dysfunction ([place]) 10/08/2014    Past Surgical History:  Procedure Laterality Date  . ABDOMINAL AORTOGRAM W/LOWER EXTREMITY N/A 09/09/2017   Procedure: ABDOMINAL AORTOGRAM W/LOWER EXTREMITY;  Surgeon: Angelia Mould, MD;  Location: [place] [facility];  Service: Cardiovascular;  Laterality: N/A;  . ABDOMINAL AORTOGRAM W/LOWER EXTREMITY Right 11/11/2019   Procedure: ABDOMINAL AORTOGRAM W/LOWER EXTREMITY;  Surgeon: Marty Heck, MD;  Location:  [place] [facility];  Service: Cardiovascular;  Laterality: Right;  . AMPUTATION Left 08/19/2013   Procedure: AMPUTATION RAY;  Surgeon: Alta Corning, MD;  Location: [place];  Service: Orthopedics;  Laterality: Left;  ray amputation left 5th  . AMPUTATION Left 10/27/2013   Procedure: AMPUTATION DIGIT-LEFT 4TH TOE, 4th and 5th metatarsal.;  Surgeon: Angelia Mould, MD;  Location: [place];  Service: Vascular;  Laterality: Left;  . AMPUTATION Left 11/04/2013   Procedure: LEFT FOOT TRANS-METATARSAL AMPUTATION WITH WOUND CLOSURE ;  Surgeon: Alta Corning, MD;  Location: [place];  Service: Orthopedics;  Laterality: Left;  . AMPUTATION Right 09/10/2017   Procedure: RIGHT FOURTH AND FIFTH TOE AMPUTATION;  Surgeon: Angelia Mould, MD;  Location: [place];  Service: Vascular;  Laterality: Right;  . CARDIAC CATHETERIZATION  10/07/2014   Procedure: LEFT HEART CATH AND CORONARY ANGIOGRAPHY;  Surgeon: Laverda Page, MD;  Location: [facility] CATH LAB;  Service: Cardiovascular;;  . COLONOSCOPY W/ POLYPECTOMY    . EMBOLECTOMY Right 09/12/2017   Procedure: Thrombectomy  & Redo Right Below Knee Popliteal Artery Bypass Graft  ;  Surgeon: Waynetta Sandy, MD;  Location: [place];  Service: Vascular;  Laterality: Right;  . EYE SURGERY Bilateral    cataract  . FEMORAL-TIBIAL BYPASS GRAFT Left 10/27/2013   Procedure: BYPASS GRAFT LEFT FEMORAL- POSTERIOR TIBIAL ARTERY;  Surgeon: Angelia Mould, MD;  Location: [place];  Service: Vascular;  Laterality: Left;  . FEMORAL-TIBIAL BYPASS GRAFT Right 09/10/2017   Procedure: RIGHT SUPERFICIAL  FEMORAL ARTERY-BELOW KNEE POPLITEAL ARTERY BYPASS GRAFT WITH VEIN;  Surgeon: Angelia Mould, MD;  Location: [place];  Service: Vascular;  Laterality: Right;  . I & D EXTREMITY Left 10/27/2013   Procedure: IRRIGATION AND DEBRIDEMENT EXTREMITY- LEFT FOOT;  Surgeon: Angelia Mould, MD;  Location: [place];  Service: Vascular;  Laterality: Left;  . I & D EXTREMITY Right  01/26/2020   Procedure: IRRIGATION AND DEBRIDEMENT EXTREMITY RIGHT FOOT WOUND;  Surgeon: Angelia Mould, MD;  Location: [place];  Service: Vascular;  Laterality: Right;  . INTRAOPERATIVE ARTERIOGRAM Right 09/10/2017   Procedure: INTRA OPERATIVE ARTERIOGRAM;  Surgeon: Angelia Mould, MD;  Location: [place];  Service: Vascular;  Laterality: Right;  . IR ANGIOGRAM FOLLOW UP STUDY  09/09/2017  . LEFT HEART CATHETERIZATION WITH CORONARY ANGIOGRAM N/A 11/09/2014   Procedure: LEFT HEART CATHETERIZATION WITH CORONARY ANGIOGRAM;  Surgeon: Laverda Page, MD;  Location: [facility] CATH LAB;  Service: Cardiovascular;  Laterality: N/A;  . LOWER EXTREMITY ANGIOGRAM Right 11/08/2015   Procedure: Lower Extremity Angiogram;  Surgeon: Serafina Mitchell, MD;  Location: [place] [facility];  Service: Cardiovascular;  Laterality: Right;  . LUMBAR LAMINECTOMY  06/13/2012   Procedure: MICRODISCECTOMY LUMBAR LAMINECTOMY;  Surgeon: Jessy Oto, MD;  Location: [place];  Service: Orthopedics;  Laterality: N/A;  Central laminectomy L2-3, L3-4, L4-5  . PACEMAKER INSERTION  10/08/14   MDT Advisa MRI compatible dual chamber pacemaker implanted by Dr Caryl Comes for syncope with post-termination pauses  . PERCUTANEOUS CORONARY STENT INTERVENTION (PCI-S)  11/09/2014   des to lad & distal circumflex         Dr  [name]  . PERIPHERAL VASCULAR BALLOON ANGIOPLASTY Right 11/11/2019   Procedure: PERIPHERAL VASCULAR BALLOON ANGIOPLASTY;  Surgeon: Marty Heck, MD;  Location: [place] [facility];  Service: Cardiovascular;  Laterality: Right;  below knee popliteal, tibioperoneal trunk, posterior tibial arteries  . PERIPHERAL VASCULAR CATHETERIZATION N/A 11/08/2015   Procedure: Abdominal Aortogram;  Surgeon: Serafina Mitchell, MD;  Location: [place] [facility];  Service: Cardiovascular;  Laterality: N/A;  . PERIPHERAL VASCULAR CATHETERIZATION Right 11/08/2015   Procedure: Peripheral Vascular Atherectomy;  Surgeon: Serafina Mitchell, MD;   Location: [place] [facility];  Service: Cardiovascular;  Laterality: Right;  . PERIPHERAL VASCULAR CATHETERIZATION N/A 10/10/2016   Procedure: Lower Extremity Angiography;  Surgeon: Waynetta Sandy, MD;  Location: [place] [facility];  Service: Cardiovascular;  Laterality: N/A;  . PERIPHERAL VASCULAR CATHETERIZATION Right 10/10/2016   Procedure: Peripheral Vascular Atherectomy;  Surgeon: Waynetta Sandy, MD;  Location: [place] [facility];  Service: Cardiovascular;  Laterality: Right;  Popliteal  . PERMANENT PACEMAKER INSERTION N/A 10/08/2014   Procedure: PERMANENT PACEMAKER INSERTION;  Surgeon: Deboraha Sprang, MD;  Location: [facility] C[facility] LAB;  Service: Cardiovascular;  Laterality: N/A;  . TEMPORARY PACEMAKER INSERTION Bilateral 10/07/2014   Procedure: TEMPORARY PACEMAKER INSERTION;  Surgeon: Laverda Page, MD;  Location: [facility] CATH LAB;  Service: Cardiovascular;  Laterality: Bilateral;    Allergies  Allergen Reactions  . Keflex [Cephalexin] Nausea And Vomiting  . Pletal [Cilostazol] Palpitations    "Can hear heart beating loudly".  . Sulfa Antibiotics Rash    Prior to Admission medications   Medication Sig Start Date End Date Taking? Authorizing Provider  ACCU-CHEK FASTCLIX LANCETS MISC Check blood sugar TID & QHS 10/01/14   Lorayne Marek, MD  apixaban (ELIQUIS) 5  MG TABS tablet Take 1 tablet (5 mg total) by mouth 2 (two) times daily. 05/29/19   Miquel Dunn, NP  aspirin 81 MG EC tablet Take 81 mg by mouth daily.     [provider]  atorvastatin (LIPITOR) 20 MG tablet Take 1 tablet by mouth once daily Patient taking differently: Take 20 mg by mouth daily.  12/24/19   Miquel Dunn, NP  blood glucose meter kit and supplies KIT Check blood sugar TID & QHS 02/10/15   Advani, Vernon Prey, MD  Blood Glucose Monitoring Suppl (ACCU-CHEK ADVANTAGE DIABETES) kit Use as instructed 10/01/14   Lorayne Marek, MD  clopidogrel (PLAVIX) 75 MG tablet Take 1 tablet (75 mg  total) by mouth daily. 11/11/19 11/10/20  Marty Heck, MD  furosemide (LASIX) 40 MG tablet Take 40 mg by mouth 2 (two) times daily.    [provider]  glucose blood (ACCU-CHEK AVIVA PLUS) test strip Use as instructed 10/01/14   Advani, Vernon Prey, MD  glucose blood (CHOICE DM FORA G20 TEST STRIPS) test strip Check blood sugar TID & QHS 02/10/15   Advani, Vernon Prey, MD  glucose blood test strip Use as instructed 11/20/13   Reyne Dumas, MD  hydrALAZINE (APRESOLINE) 100 MG tablet TAKE 1 TABLET BY MOUTH THREE TIMES DAILY Patient taking differently: Take 100 mg by mouth 3 (three) times daily.  09/25/19   Miquel Dunn, NP  Insulin NPH Isophane & Regular (NOVOLIN 70/30 [place]) Inject 46-56 Units into the skin See admin instructions. Inject 56 units subcutaneously in the morning & inject 46 units subcutaneously in the evening.    [provider]  lisinopril (ZESTRIL) 10 MG tablet Take 1 tablet by mouth once daily Patient taking differently: Take 10 mg by mouth daily.  12/24/19   Miquel Dunn, NP  metoprolol tartrate (LOPRESSOR) 100 MG tablet Take 1 tablet by mouth twice daily 01/27/20   Miquel Dunn, NP  Multiple Vitamin (MULTIVITAMIN WITH MINERALS) TABS tablet Take 1 tablet by mouth daily.    [provider]  oxyCODONE-acetaminophen (PERCOCET) 5-325 MG tablet Take 1 tablet by mouth every 6 (six) hours as needed. Patient not taking: Reported on 02/10/2020 01/28/20   Gabriel Earing, PA-C  prazosin (MINIPRESS) 2 MG capsule TAKE 1 CAPSULE BY MOUTH EVERY  EVENING AFTER SUPPER Patient taking differently: Take 2 mg by mouth daily.  11/25/19   Adrian Prows, MD    Social History   Socioeconomic History  . Marital status: Married    Spouse name: Not on file  . Number of children: 2  . Years of education: Not on file  . Highest education level: Not on file  Occupational History  . Occupation: Disabled  Tobacco Use  . Smoking status: Former Smoker     Packs/day: 1.50    Years: 30.00    Pack years: 45.00    Types: Cigars    Quit date: 10/07/2014    Years since quitting: 5.3  . Smokeless tobacco: Never Used  Substance and Sexual Activity  . Alcohol use: Yes    Alcohol/week: 6.0 standard drinks    Types: 6 Cans of beer per week  . Drug use: No  . Sexual activity: Not on file  Other Topics Concern  . Not on file  Social History Narrative  . Not on file   Social Determinants of Health   Financial Resource Strain:   . Difficulty of Paying Living Expenses:   Food Insecurity:   . Worried About Running  Out of Food in the Last Year:   . [place] in the Last Year:   Transportation Needs:   . Lack of Transportation (Medical):   [place] Kitchen Lack of Transportation (Non-Medical):   Physical Activity:   . Days of Exercise per Week:   . Minutes of Exercise per Session:   Stress:   . Feeling of Stress :   Social Connections:   . Frequency of Communication with Friends and Family:   . Frequency of Social Gatherings with Friends and Family:   . Attends Religious Services:   . Active Member of Clubs or Organizations:   . Attends Archivist Meetings:   Marland Kitchen Marital Status:   Intimate Partner Violence:   . Fear of Current or Ex-Partner:   . Emotionally Abused:   Mar[place] Kitchen Physically Abused:   . Sexually Abused:      Family History  Problem Relation Age of Onset  . Diabetes type II Mother   . Hypertension Mother   . Diabetes Mother   . Liver cancer Father   . Diabetes type II Sister   . Breast cancer Sister   . Diabetes Sister   . Hypertension Sister   . Diabetes type II Brother   . Kidney failure Brother   . Diabetes Brother   . Hypertension Brother   . Diabetes type II Sister     ROS: _0  Positive   _1  Negative   _2  All sytems reviewed and are negative  Cardiovascular: _3  chest pain/pressure _4  palpitations _5  SOB lying flat _6  DOE _7  pain in legs while walking _8  pain in legs at rest _9  pain in legs at night _10   non-healing ulcers _11  hx of DVT _12  swelling in legs  Pulmonary: _13  productive cough _14  asthma/wheezing _15  home O2  Neurologic: _16  weakness in _17  arms _18  legs _19  numbness in _20  arms _21  legs _22  hx of CVA _23  mini stroke _24 difficulty speaking or slurred speech _25  temporary loss of vision in one eye _26  dizziness  Hematologic: _27  hx of cancer _28  bleeding problems _29  problems with blood clotting easily  Endocrine:   _30  diabetes _31  thyroid disease  GI _32  vomiting blood _33  blood in stool  GU: _34  CKD/renal failure _35  HD--_36  M/W/F or _37  T/T/S _38  burning with urination _39  blood in urine  Psychiatric: _40  anxiety _41  depression  Musculoskeletal: _42  arthritis _43  joint pain  Integumentary: _44  rashes _45  ulcers  Constitutional: _46  fever _47  chills   Physical Examination  Vitals:   02/16/20 0056 02/16/20 0130  BP: 139/62 (!) 153/110  Pulse: 72 66  Resp:    Temp:    SpO2: 100% 100%   There is no height or weight on file to calculate BMI.  General:  WDWN in NAD Gait: Not observed HENT: WNL, normocephalic Pulmonary: normal non-labored breathing, without Rales, rhonchi,  wheezing Cardiac: regular, without  Murmurs, rubs or gallops Abdomen:  soft, NT/ND, no masses Vascular Exam/Pulses:  Right Left  Radial 2+ (normal) 2+ (normal)  Ulnar    Femoral 2+ (normal) 2+ (normal)  Popliteal    DP Not palpable, warm   PT Not palpable warm    Extremities: see right foot pictured below Neurologic: A&O X 3; Appropriate Affect ; SENSATION: normal; MOTOR FUNCTION:  moving all extremities equally. Speech is fluent/normal        CBC    Component Value Date/Time   WBC 7.3 02/15/2020 1800   RBC 3.33 (L) 02/15/2020 1800   HGB  9.4 (L) 02/15/2020 1800   HCT 30.1 (L) 02/15/2020 1800   PLT 258 02/15/2020 1800   MCV 90.4 02/15/2020 1800   MCH 28.2 02/15/2020 1800   MCHC 31.2 02/15/2020 1800   RDW 15.2 02/15/2020 1800   LYMPHSABS 1.3 02/15/2020 1800   MONOABS 0.8  02/15/2020 1800   EOSABS 0.2 02/15/2020 1800   BASOSABS 0.1 02/15/2020 1800    BMET    Component Value Date/Time   NA 128 (L) 02/15/2020 1800   K 4.3 02/15/2020 1800   CL 96 (L) 02/15/2020 1800   CO2 19 (L) 02/15/2020 1800   GLUCOSE 328 (H) 02/15/2020 1800   BUN 77 (H) 02/15/2020 1800   CREATININE 2.79 (H) 02/15/2020 1800   CREATININE 1.52 (H) 02/07/2015 1151   CALCIUM 8.7 (L) 02/15/2020 1800   GFRNONAA 23 (L) 02/15/2020 1800   GFRNONAA 50 (L) 02/07/2015 1151   GFRAA 27 (L) 02/15/2020 1800   GFRAA 58 (L) 02/07/2015 1151    COAGS: Lab Results  Component Value Date   INR 1.2 01/26/2020   INR 1.18 09/09/2017   INR 1.20 08/08/2017     Non-Invasive Vascular Imaging:    Right lower extremity arterial bypass duplex 01/14/2020 shows a patent bypass.   ASSESSMENT/PLAN: This is a 59 y.o. male with nonhealing diabetic foot wound of the right lower extremity in the setting of patent SFA to below-knee popliteal bypass and subsequent tibial intervention.  Patient was just seen by Dr. Scot Dock last Wednesday in clinic and discussed if progression of tissue loss next option would likely be below-knee amputation.  Discussed with patient plan for below-knee amputation versus further wound debridement knowing there is very low likelihood that this is going to heal at this point especially with extent of tissue loss.  He would like to think about his options.  I will let Dr. Scot Dock discuss next steps with him in the morning.  We will hold his Eliquis in anticipation of the OR.  Marty Heck, MD [facility] Office: [phone]

## 2020-02-17 ENCOUNTER — Ambulatory Visit: Payer: Medicare Other | Admitting: Vascular Surgery

## 2020-02-17 ENCOUNTER — Other Ambulatory Visit: Payer: Self-pay

## 2020-02-18 ENCOUNTER — Encounter (HOSPITAL_COMMUNITY): Payer: Self-pay | Admitting: Vascular Surgery

## 2020-02-18 ENCOUNTER — Other Ambulatory Visit: Payer: Self-pay

## 2020-02-18 MED ORDER — VANCOMYCIN HCL 1500 MG/300ML IV SOLN
1500.0000 mg | INTRAVENOUS | Status: AC
  Administered 2020-02-19: 1500 mg via INTRAVENOUS
  Filled 2020-02-18: qty 300

## 2020-02-18 NOTE — Progress Notes (Signed)
Spoke with pt for pre-op call. Pt has hx of CAD, A-fib and has a pacemaker. Sent message to Perioperative Cardiac Device Programming for instructions.   Pt denies any recent chest pain. Pt was instructed to hold his Eliquis, last dose was 02/15/20 (AM dose). I sent a message to Dr. Scot Dock asking if pt is to continue his Plavix and he responded that pt can continue Plavix.  Pt is a type 2 Diabetic. Last A1C was 8.6 on 01/27/20. He states his fasting blood sugar was 144 this AM. Instructed pt to take 32 units of his Novolin 70/30 insulin this evening (70% of regular dose). Instructed pt to check his blood sugar when he gets up and every 2 hours until he leaves for the hospital in the morning. If blood sugar is 70 or below, treat with 1/2 cup of clear juice (apple or cranberry) and recheck blood sugar 15 minutes after drinking juice. If blood sugar continues to be 70 or below, call the Short Stay department and ask to speak to a nurse. Pt voiced understanding.   Covid test done on 02/16/20 while in the ED. He states he's been quarantined since leaving the ED that day. Understands that he needs to stay in quarantine until he comes to the hospital Friday.

## 2020-02-19 ENCOUNTER — Observation Stay (HOSPITAL_COMMUNITY)
Admission: AD | Admit: 2020-02-19 | Discharge: 2020-02-20 | Disposition: A | Discharge status: Home Health | Payer: Medicare Other | Attending: Vascular Surgery | Admitting: Vascular Surgery

## 2020-02-19 ENCOUNTER — Encounter (HOSPITAL_COMMUNITY): Payer: Self-pay | Admitting: Vascular Surgery

## 2020-02-19 ENCOUNTER — Encounter (HOSPITAL_COMMUNITY)
Admission: AD | Disposition: A | Discharge status: Home Health | Payer: Self-pay | Source: Home / Self Care | Attending: Vascular Surgery

## 2020-02-19 ENCOUNTER — Ambulatory Visit (HOSPITAL_COMMUNITY): Payer: Medicare Other

## 2020-02-19 DIAGNOSIS — E1122 Type 2 diabetes mellitus with diabetic chronic kidney disease: Secondary | ICD-10-CM | POA: Insufficient documentation

## 2020-02-19 DIAGNOSIS — Z794 Long term (current) use of insulin: Secondary | ICD-10-CM | POA: Insufficient documentation

## 2020-02-19 DIAGNOSIS — Z89421 Acquired absence of other right toe(s): Secondary | ICD-10-CM | POA: Insufficient documentation

## 2020-02-19 DIAGNOSIS — E11621 Type 2 diabetes mellitus with foot ulcer: Secondary | ICD-10-CM | POA: Diagnosis not present

## 2020-02-19 DIAGNOSIS — Z950 Presence of cardiac pacemaker: Secondary | ICD-10-CM | POA: Diagnosis not present

## 2020-02-19 DIAGNOSIS — I96 Gangrene, not elsewhere classified: Secondary | ICD-10-CM | POA: Insufficient documentation

## 2020-02-19 DIAGNOSIS — E1152 Type 2 diabetes mellitus with diabetic peripheral angiopathy with gangrene: Secondary | ICD-10-CM | POA: Diagnosis not present

## 2020-02-19 DIAGNOSIS — N183 Chronic kidney disease, stage 3 unspecified: Secondary | ICD-10-CM | POA: Diagnosis not present

## 2020-02-19 DIAGNOSIS — I2510 Atherosclerotic heart disease of native coronary artery without angina pectoris: Secondary | ICD-10-CM | POA: Diagnosis not present

## 2020-02-19 DIAGNOSIS — L97509 Non-pressure chronic ulcer of other part of unspecified foot with unspecified severity: Secondary | ICD-10-CM | POA: Diagnosis present

## 2020-02-19 DIAGNOSIS — M1990 Unspecified osteoarthritis, unspecified site: Secondary | ICD-10-CM | POA: Diagnosis not present

## 2020-02-19 DIAGNOSIS — I129 Hypertensive chronic kidney disease with stage 1 through stage 4 chronic kidney disease, or unspecified chronic kidney disease: Secondary | ICD-10-CM | POA: Insufficient documentation

## 2020-02-19 DIAGNOSIS — L97519 Non-pressure chronic ulcer of other part of right foot with unspecified severity: Secondary | ICD-10-CM | POA: Insufficient documentation

## 2020-02-19 DIAGNOSIS — Z87891 Personal history of nicotine dependence: Secondary | ICD-10-CM | POA: Insufficient documentation

## 2020-02-19 DIAGNOSIS — E119 Type 2 diabetes mellitus without complications: Secondary | ICD-10-CM | POA: Diagnosis not present

## 2020-02-19 DIAGNOSIS — I10 Essential (primary) hypertension: Secondary | ICD-10-CM | POA: Diagnosis not present

## 2020-02-19 HISTORY — PX: APPLICATION OF WOUND VAC: SHX5189

## 2020-02-19 HISTORY — PX: AMPUTATION: SHX166

## 2020-02-19 HISTORY — PX: WOUND DEBRIDEMENT: SHX247

## 2020-02-19 LAB — GLUCOSE, CAPILLARY
Glucose-Capillary: 213 mg/dL — ABNORMAL HIGH (ref 70–99)
Glucose-Capillary: 249 mg/dL — ABNORMAL HIGH (ref 70–99)
Glucose-Capillary: 270 mg/dL — ABNORMAL HIGH (ref 70–99)
Glucose-Capillary: 352 mg/dL — ABNORMAL HIGH (ref 70–99)

## 2020-02-19 SURGERY — DEBRIDEMENT, WOUND
Anesthesia: General | Laterality: Right

## 2020-02-19 MED ORDER — DOCUSATE SODIUM 100 MG PO CAPS
100.0000 mg | ORAL_CAPSULE | Freq: Every day | ORAL | Status: DC
  Administered 2020-02-20: 100 mg via ORAL
  Filled 2020-02-19: qty 1

## 2020-02-19 MED ORDER — 0.9 % SODIUM CHLORIDE (POUR BTL) OPTIME
TOPICAL | Status: DC | PRN
  Administered 2020-02-19: 1000 mL

## 2020-02-19 MED ORDER — VASOPRESSIN 20 UNIT/ML IV SOLN
INTRAVENOUS | Status: DC | PRN
  Administered 2020-02-19 (×2): 1 [IU] via INTRAVENOUS
  Administered 2020-02-19: 2 [IU] via INTRAVENOUS

## 2020-02-19 MED ORDER — POLYETHYLENE GLYCOL 3350 17 G PO PACK: 17.0000 g | PACK | Freq: Every day | ORAL | Status: DC | PRN

## 2020-02-19 MED ORDER — PHENOL 1.4 % MT LIQD: 1.0000 | OROMUCOSAL | Status: DC | PRN

## 2020-02-19 MED ORDER — CHLORHEXIDINE GLUCONATE 4 % EX LIQD: 60.0000 mL | Freq: Once | CUTANEOUS | Status: DC

## 2020-02-19 MED ORDER — HYDRALAZINE HCL 50 MG PO TABS
100.0000 mg | ORAL_TABLET | Freq: Two times a day (BID) | ORAL | Status: DC
  Administered 2020-02-20: 100 mg via ORAL
  Filled 2020-02-19 (×2): qty 2

## 2020-02-19 MED ORDER — GLYCOPYRROLATE PF 0.2 MG/ML IJ SOSY
PREFILLED_SYRINGE | INTRAMUSCULAR | Status: AC
  Filled 2020-02-19: qty 1

## 2020-02-19 MED ORDER — ASPIRIN EC 81 MG PO TBEC
81.0000 mg | DELAYED_RELEASE_TABLET | Freq: Every day | ORAL | Status: DC
  Administered 2020-02-20: 81 mg via ORAL
  Filled 2020-02-19: qty 1

## 2020-02-19 MED ORDER — SODIUM CHLORIDE 0.9 % IV SOLN: INTRAVENOUS | Status: DC

## 2020-02-19 MED ORDER — LIDOCAINE HCL (CARDIAC) PF 100 MG/5ML IV SOSY
PREFILLED_SYRINGE | INTRAVENOUS | Status: DC | PRN
  Administered 2020-02-19: 60 mg via INTRAVENOUS

## 2020-02-19 MED ORDER — VASOPRESSIN 20 UNIT/ML IV SOLN
INTRAVENOUS | Status: AC
  Filled 2020-02-19: qty 1

## 2020-02-19 MED ORDER — OXYCODONE-ACETAMINOPHEN 5-325 MG PO TABS
1.0000 | ORAL_TABLET | ORAL | Status: DC | PRN
  Administered 2020-02-19 – 2020-02-20 (×2): 1 via ORAL
  Filled 2020-02-19: qty 1
  Filled 2020-02-19: qty 2

## 2020-02-19 MED ORDER — FENTANYL CITRATE (PF) 250 MCG/5ML IJ SOLN
INTRAMUSCULAR | Status: AC
  Filled 2020-02-19: qty 5

## 2020-02-19 MED ORDER — INSULIN ASPART PROT & ASPART (70-30 MIX) 100 UNIT/ML ~~LOC~~ SUSP
56.0000 [IU] | Freq: Every day | SUBCUTANEOUS | Status: DC
  Administered 2020-02-20: 56 [IU] via SUBCUTANEOUS
  Filled 2020-02-19: qty 10

## 2020-02-19 MED ORDER — APIXABAN 5 MG PO TABS
5.0000 mg | ORAL_TABLET | Freq: Two times a day (BID) | ORAL | Status: DC
  Administered 2020-02-20: 5 mg via ORAL
  Filled 2020-02-19: qty 1

## 2020-02-19 MED ORDER — LACTATED RINGERS IV SOLN: INTRAVENOUS | Status: DC | PRN

## 2020-02-19 MED ORDER — MAGNESIUM SULFATE 2 GM/50ML IV SOLN: 2.0000 g | Freq: Every day | INTRAVENOUS | Status: DC | PRN

## 2020-02-19 MED ORDER — METOPROLOL TARTRATE 100 MG PO TABS
100.0000 mg | ORAL_TABLET | Freq: Two times a day (BID) | ORAL | Status: DC
  Administered 2020-02-20: 100 mg via ORAL
  Filled 2020-02-19 (×2): qty 1

## 2020-02-19 MED ORDER — DIPHENHYDRAMINE HCL 50 MG/ML IJ SOLN
INTRAMUSCULAR | Status: AC
  Filled 2020-02-19: qty 1

## 2020-02-19 MED ORDER — GUAIFENESIN-DM 100-10 MG/5ML PO SYRP: 15.0000 mL | ORAL_SOLUTION | ORAL | Status: DC | PRN

## 2020-02-19 MED ORDER — ONDANSETRON HCL 4 MG/2ML IJ SOLN: 4.0000 mg | Freq: Once | INTRAMUSCULAR | Status: DC | PRN

## 2020-02-19 MED ORDER — PHENYLEPHRINE HCL (PRESSORS) 10 MG/ML IV SOLN
INTRAVENOUS | Status: DC | PRN
  Administered 2020-02-19 (×2): 80 ug via INTRAVENOUS
  Administered 2020-02-19: 160 ug via INTRAVENOUS
  Administered 2020-02-19: 80 ug via INTRAVENOUS

## 2020-02-19 MED ORDER — POTASSIUM CHLORIDE CRYS ER 20 MEQ PO TBCR: 20.0000 meq | EXTENDED_RELEASE_TABLET | Freq: Every day | ORAL | Status: DC | PRN

## 2020-02-19 MED ORDER — PROPOFOL 10 MG/ML IV BOLUS
INTRAVENOUS | Status: DC | PRN
  Administered 2020-02-19: 170 mg via INTRAVENOUS

## 2020-02-19 MED ORDER — PANTOPRAZOLE SODIUM 40 MG PO TBEC
40.0000 mg | DELAYED_RELEASE_TABLET | Freq: Every day | ORAL | Status: DC
  Administered 2020-02-19 – 2020-02-20 (×2): 40 mg via ORAL
  Filled 2020-02-19 (×2): qty 1

## 2020-02-19 MED ORDER — ONDANSETRON HCL 4 MG/2ML IJ SOLN
INTRAMUSCULAR | Status: AC
  Filled 2020-02-19: qty 2

## 2020-02-19 MED ORDER — FUROSEMIDE 40 MG PO TABS
40.0000 mg | ORAL_TABLET | Freq: Two times a day (BID) | ORAL | Status: DC
  Administered 2020-02-19 – 2020-02-20 (×2): 40 mg via ORAL
  Filled 2020-02-19 (×2): qty 1

## 2020-02-19 MED ORDER — ACETAMINOPHEN 325 MG RE SUPP: 325.0000 mg | RECTAL | Status: DC | PRN

## 2020-02-19 MED ORDER — CIPROFLOXACIN HCL 500 MG PO TABS
250.0000 mg | ORAL_TABLET | Freq: Two times a day (BID) | ORAL | Status: DC
  Administered 2020-02-19 – 2020-02-20 (×2): 250 mg via ORAL
  Filled 2020-02-19 (×2): qty 1

## 2020-02-19 MED ORDER — FENTANYL CITRATE (PF) 100 MCG/2ML IJ SOLN
INTRAMUSCULAR | Status: DC | PRN
  Administered 2020-02-19: 50 ug via INTRAVENOUS

## 2020-02-19 MED ORDER — HYDRALAZINE HCL 20 MG/ML IJ SOLN: 5.0000 mg | INTRAMUSCULAR | Status: DC | PRN

## 2020-02-19 MED ORDER — ONDANSETRON HCL 4 MG/2ML IJ SOLN
INTRAMUSCULAR | Status: DC | PRN
  Administered 2020-02-19: 4 mg via INTRAVENOUS

## 2020-02-19 MED ORDER — ACETAMINOPHEN 325 MG PO TABS: 325.0000 mg | ORAL_TABLET | ORAL | Status: DC | PRN

## 2020-02-19 MED ORDER — EPHEDRINE SULFATE 50 MG/ML IJ SOLN
INTRAMUSCULAR | Status: DC | PRN
  Administered 2020-02-19 (×2): 10 mg via INTRAVENOUS

## 2020-02-19 MED ORDER — METOPROLOL TARTRATE 5 MG/5ML IV SOLN: 2.0000 mg | INTRAVENOUS | Status: DC | PRN

## 2020-02-19 MED ORDER — ALUM & MAG HYDROXIDE-SIMETH 200-200-20 MG/5ML PO SUSP: 15.0000 mL | ORAL | Status: DC | PRN

## 2020-02-19 MED ORDER — MEPERIDINE HCL 25 MG/ML IJ SOLN: 6.2500 mg | INTRAMUSCULAR | Status: DC | PRN

## 2020-02-19 MED ORDER — CLOPIDOGREL BISULFATE 75 MG PO TABS
75.0000 mg | ORAL_TABLET | Freq: Every day | ORAL | Status: DC
  Administered 2020-02-20: 75 mg via ORAL
  Filled 2020-02-19: qty 1

## 2020-02-19 MED ORDER — ONDANSETRON HCL 4 MG/2ML IJ SOLN: 4.0000 mg | Freq: Four times a day (QID) | INTRAMUSCULAR | Status: DC | PRN

## 2020-02-19 MED ORDER — FENTANYL CITRATE (PF) 100 MCG/2ML IJ SOLN: 25.0000 ug | INTRAMUSCULAR | Status: DC | PRN

## 2020-02-19 MED ORDER — MIDAZOLAM HCL 2 MG/2ML IJ SOLN
INTRAMUSCULAR | Status: AC
  Filled 2020-02-19: qty 2

## 2020-02-19 MED ORDER — ATORVASTATIN CALCIUM 10 MG PO TABS: 20.0000 mg | ORAL_TABLET | Freq: Every day | ORAL | Status: DC

## 2020-02-19 MED ORDER — PRAZOSIN HCL 2 MG PO CAPS
2.0000 mg | ORAL_CAPSULE | Freq: Every day | ORAL | Status: DC
  Administered 2020-02-19: 2 mg via ORAL
  Filled 2020-02-19 (×2): qty 1

## 2020-02-19 MED ORDER — BISACODYL 10 MG RE SUPP: 10.0000 mg | Freq: Every day | RECTAL | Status: DC | PRN

## 2020-02-19 MED ORDER — DIPHENHYDRAMINE HCL 50 MG/ML IJ SOLN
INTRAMUSCULAR | Status: DC | PRN
  Administered 2020-02-19: 12.5 mg via INTRAVENOUS

## 2020-02-19 MED ORDER — DEXAMETHASONE SODIUM PHOSPHATE 10 MG/ML IJ SOLN
INTRAMUSCULAR | Status: AC
  Filled 2020-02-19: qty 1

## 2020-02-19 MED ORDER — LISINOPRIL 10 MG PO TABS
10.0000 mg | ORAL_TABLET | Freq: Every day | ORAL | Status: DC
  Administered 2020-02-19 – 2020-02-20 (×2): 10 mg via ORAL
  Filled 2020-02-19 (×2): qty 1

## 2020-02-19 MED ORDER — INSULIN ASPART PROT & ASPART (70-30 MIX) 100 UNIT/ML ~~LOC~~ SUSP
46.0000 [IU] | Freq: Every day | SUBCUTANEOUS | Status: DC
  Administered 2020-02-19: 46 [IU] via SUBCUTANEOUS
  Filled 2020-02-19: qty 10

## 2020-02-19 MED ORDER — MORPHINE SULFATE (PF) 4 MG/ML IV SOLN: 4.0000 mg | INTRAVENOUS | Status: DC | PRN

## 2020-02-19 MED ORDER — GLYCOPYRROLATE 0.2 MG/ML IJ SOLN
INTRAMUSCULAR | Status: DC | PRN
  Administered 2020-02-19: .2 mg via INTRAVENOUS

## 2020-02-19 MED ORDER — MIDAZOLAM HCL 2 MG/2ML IJ SOLN
INTRAMUSCULAR | Status: DC | PRN
  Administered 2020-02-19: 2 mg via INTRAVENOUS

## 2020-02-19 MED ORDER — LABETALOL HCL 5 MG/ML IV SOLN: 10.0000 mg | INTRAVENOUS | Status: DC | PRN

## 2020-02-19 SURGICAL SUPPLY — 32 items
BLADE AVERAGE 25X9 (BLADE) ×2 IMPLANT
BNDG ELASTIC 4X5.8 VLCR STR LF (GAUZE/BANDAGES/DRESSINGS) ×2 IMPLANT
BNDG GAUZE ELAST 4 BULKY (GAUZE/BANDAGES/DRESSINGS) ×2 IMPLANT
CANISTER SUCT 3000ML PPV (MISCELLANEOUS) ×2 IMPLANT
CANISTER WOUNDNEG PRESSURE 500 (CANNISTER) ×2 IMPLANT
COVER SURGICAL LIGHT HANDLE (MISCELLANEOUS) ×2 IMPLANT
COVER WAND RF STERILE (DRAPES) IMPLANT
DRAPE EXTREMITY T 121X128X90 (DISPOSABLE) ×2 IMPLANT
DRAPE HALF SHEET 40X57 (DRAPES) ×2 IMPLANT
DRSG VAC ATS MED SENSATRAC (GAUZE/BANDAGES/DRESSINGS) ×2 IMPLANT
ELECT REM PT RETURN 9FT ADLT (ELECTROSURGICAL) ×2
ELECTRODE REM PT RTRN 9FT ADLT (ELECTROSURGICAL) ×1 IMPLANT
GAUZE SPONGE 4X4 12PLY STRL (GAUZE/BANDAGES/DRESSINGS) IMPLANT
GAUZE SPONGE 4X4 12PLY STRL LF (GAUZE/BANDAGES/DRESSINGS) ×2 IMPLANT
GLOVE BIO SURGEON STRL SZ7.5 (GLOVE) ×2 IMPLANT
GLOVE BIOGEL PI IND STRL 8 (GLOVE) ×1 IMPLANT
GLOVE BIOGEL PI INDICATOR 8 (GLOVE) ×1
GOWN STRL REUS W/ TWL LRG LVL3 (GOWN DISPOSABLE) ×4 IMPLANT
GOWN STRL REUS W/TWL LRG LVL3 (GOWN DISPOSABLE) ×8
KIT BASIN OR (CUSTOM PROCEDURE TRAY) ×2 IMPLANT
KIT TURNOVER KIT B (KITS) ×2 IMPLANT
NS IRRIG 1000ML POUR BTL (IV SOLUTION) ×2 IMPLANT
PACK GENERAL/GYN (CUSTOM PROCEDURE TRAY) ×2 IMPLANT
PAD ARMBOARD 7.5X6 YLW CONV (MISCELLANEOUS) ×4 IMPLANT
SPONGE LAP 18X18 RF (DISPOSABLE) ×2 IMPLANT
SUT ETHILON 3 0 FSL (SUTURE) ×4 IMPLANT
SUT ETHILON 3 0 PS 1 (SUTURE) ×2 IMPLANT
SUT VIC AB 3-0 SH 27 (SUTURE) ×2
SUT VIC AB 3-0 SH 27X BRD (SUTURE) ×1 IMPLANT
TOWEL GREEN STERILE (TOWEL DISPOSABLE) ×4 IMPLANT
UNDERPAD 30X30 (UNDERPADS AND DIAPERS) ×2 IMPLANT
WATER STERILE IRR 1000ML POUR (IV SOLUTION) ×2 IMPLANT

## 2020-02-19 NOTE — Anesthesia Preprocedure Evaluation (Signed)
Anesthesia Evaluation  Patient identified by MRN, date of birth, ID band Patient awake    Reviewed: Allergy & Precautions, NPO status , Patient's Chart, lab work & pertinent test results  Airway Mallampati: III  TM Distance: >3 FB Neck ROM: Full    Dental  (+) Poor Dentition, Chipped, Missing, Loose   Pulmonary former smoker,    Pulmonary exam normal breath sounds clear to auscultation       Cardiovascular hypertension, Pt. on medications + CAD and + Peripheral Vascular Disease  + pacemaker  Rhythm:Regular Rate:Normal     Neuro/Psych PSYCHIATRIC DISORDERS    GI/Hepatic negative GI ROS, Neg liver ROS,   Endo/Other  diabetes, Poorly Controlled, Type 2, Insulin Dependent  Renal/GU Renal InsufficiencyRenal disease  negative genitourinary   Musculoskeletal  (+) Arthritis , Osteoarthritis,  Open wound right foot   Abdominal (+) + obese,   Peds  Hematology  (+) anemia ,   Anesthesia Other Findings   Reproductive/Obstetrics                             Anesthesia Physical Anesthesia Plan  ASA: III  Anesthesia Plan: General   Post-op Pain Management:    Induction: Intravenous  PONV Risk Score and Plan: 3 and Ondansetron, Treatment may vary due to age or medical condition and Midazolam  Airway Management Planned: LMA  Additional Equipment:   Intra-op Plan:   Post-operative Plan: Extubation in OR  Informed Consent: I have reviewed the patients History and Physical, chart, labs and discussed the procedure including the risks, benefits and alternatives for the proposed anesthesia with the patient or authorized representative who has indicated his/her understanding and acceptance.     Dental advisory given  Plan Discussed with: CRNA and Surgeon  Anesthesia Plan Comments:         Anesthesia Quick Evaluation

## 2020-02-19 NOTE — Interval H&P Note (Signed)
History and Physical Interval Note:  02/19/2020 10:42 AM  Alexander [name]  has presented today for surgery, with the diagnosis of NON HEALING WOUND RIGHT FOOT.  The various methods of treatment have been discussed with the patient and family. After consideration of risks, benefits and other options for treatment, the patient has consented to  Procedure(s): DEBRIDEMENT WOUND RIGHT FOOT (Right) AMPUTATION RIGHT 3RD TOE WITH VAC PLACEMENT (Right) as a surgical intervention.  The patient's history has been reviewed, patient examined, no change in status, stable for surgery.  I have reviewed the patient's chart and labs.  Questions were answered to the patient's satisfaction.     [name]

## 2020-02-19 NOTE — Interval H&P Note (Signed)
History and Physical Interval Note:  02/19/2020 11:14 AM  Alexander [name]  has presented today for surgery, with the diagnosis of NON HEALING WOUND RIGHT FOOT.  The various methods of treatment have been discussed with the patient and family. After consideration of risks, benefits and other options for treatment, the patient has consented to  Procedure(s): DEBRIDEMENT WOUND RIGHT FOOT (Right) AMPUTATION RIGHT 3RD TOE WITH VAC PLACEMENT (Right) as a surgical intervention.  The patient's history has been reviewed, patient examined, no change in status, stable for surgery.  I have reviewed the patient's chart and labs.  Questions were answered to the patient's satisfaction.     [name]

## 2020-02-19 NOTE — Transfer of Care (Signed)
Immediate Anesthesia Transfer of Care Note  Patient: Alexander [name]  Procedure(s) Performed: DEBRIDEMENT WOUND RIGHT FOOT (Right ) AMPUTATION RIGHT 3RD TOE (Right ) Application Of Wound Vac Right foot (Right )  Patient Location: PACU  Anesthesia Type:General  Level of Consciousness: awake, alert  and oriented  Airway & Oxygen Therapy: Patient Spontanous Breathing and Patient connected to nasal cannula oxygen  Post-op Assessment: Report given to RN, Post -op Vital signs reviewed and stable and Patient moving all extremities X 4  Post vital signs: Reviewed and stable  Last Vitals:  Vitals Value Taken Time  BP 137/57 02/19/20 1225  Temp 36.1 C 02/19/20 1225  Pulse 67 02/19/20 1231  Resp 14 02/19/20 1231  SpO2 93 % 02/19/20 1231  Vitals shown include unvalidated device data.  Last Pain:  Vitals:   02/19/20 1225  PainSc: 2          Complications: No apparent anesthesia complications

## 2020-02-19 NOTE — TOC Initial Note (Signed)
Transition of Care (TOC) - Initial/Assessment Note  Marvetta Gibbons RN, BSN Transitions of Care Unit 4E- RN Case Manager [phone]   Patient Details  Name: Alexander [name] MRN: [id number] Date of Birth: 1961-01-21  Transition of Care [facility]) CM/SW Contact:    Dawayne Patricia, RN Phone Number: 02/19/2020, 3:42 PM  Clinical Narrative:                 Pt s/p I&D and replacement of wound VAC- pt from home and PTA was active with [facility] for [facility] [place]- (spoke with Britney with [facility] and confirmed services and plan for resumption)- pt also has home wound VAC with [facility].  Order placed for resumption of HHRN for home wound VAC drsg needs.    Expected Discharge Plan: [place] Barriers to Discharge: Continued Medical Work up   Patient Goals and CMS Choice        Expected Discharge Plan and Services Expected Discharge Plan: [place]   Discharge Planning Services: CM Consult Post Acute Care Choice: Home Health, Resumption of Svcs/PTA Provider Living arrangements for the past 2 months: Single Family Home                 DME Arranged: Vac DME Agency: [facility](already have home wound VAC)       HH Arranged: RN HH Agency: [facility] Date [place]: 02/19/20 Time HH Agency Contacted: 14 Representative spoke with at [place]: [place]  Prior Living Arrangements/Services Living arrangements for the past 2 months: [place] with:: Spouse Patient language and need for interpreter reviewed:: Yes Do you feel safe going back to the place where you live?: Yes      Need for Family Participation in Patient Care: Yes (Comment) Care giver support system in place?: Yes (comment) Current home services: DME, Home RN Criminal Activity/Legal Involvement Pertinent to Current Situation/Hospitalization: No - Comment as needed  Activities of Daily Living Home Assistive Devices/Equipment: CBG Meter ADL Screening (condition at time of  admission) Patient's cognitive ability adequate to safely complete daily activities?: Yes Is the patient deaf or have difficulty hearing?: No Does the patient have difficulty seeing, even when wearing glasses/contacts?: No Does the patient have difficulty concentrating, remembering, or making decisions?: No Patient able to express need for assistance with ADLs?: Yes Does the patient have difficulty dressing or bathing?: No Independently performs ADLs?: Yes (appropriate for developmental age) Does the patient have difficulty walking or climbing stairs?: Yes Weakness of Legs: Right Weakness of Arms/Hands: None  Permission Sought/Granted Permission sought to share information with : Chartered certified accountant granted to share information with : Yes, Verbal Permission Granted     Permission granted to share info w AGENCY: [place] agency        Emotional Assessment Appearance:: Appears stated age     Orientation: : Oriented to Self, Oriented to  Time, Oriented to Situation, Oriented to Place Alcohol / Substance Use: Not Applicable Psych Involvement: No (comment)  Admission diagnosis:  Diabetic foot ulcer associated with type 2 diabetes mellitus ([place]) [W97.989, L97.509] Patient Active Problem List   Diagnosis Date Noted  . Diabetic foot ulcer associated with type 2 diabetes mellitus ([place]) 02/19/2020  . Heel ulcer ([place]) 11/05/2019  . Atherosclerotic PVD with ulceration ([place]) 09/09/2017  . Essential hypertension, benign 02/07/2015  . Renal insufficiency 02/07/2015  . Post PTCA 11/09/2014  . S/P PTCA (percutaneous transluminal coronary angioplasty) 11/09/2014  . Pacemaker: [place] DR MRI  [id number] Dual chamber pacemaker 10/08/2014 10/08/2014  . Encounter for care of pacemaker 10/08/2014  . Sinus node dysfunction ([place]) 10/08/2014  . Cardiac asystole ([place]) 10/07/2014  . Cardiac syncope 10/07/2014  . Diabetes mellitus with stage 3 chronic kidney disease ([place])  10/07/2014  . CAD (coronary artery disease), native coronary artery 10/07/2014  . Diabetes mellitus due to underlying condition without complications ([place]) 52/77/8242  . Anemia, unspecified 05/25/2014  . Syncope 04/30/2014  . Atherosclerosis of native arteries of the extremities with gangrene ([place]) 12/16/2013  . Volume overload 10/30/2013  . Acute renal failure ([place]) 10/28/2013  . PAD (peripheral artery disease) ([place]) 10/27/2013  . Leukocytosis 10/21/2013  . Anemia 10/21/2013  . Osteomyelitis of left foot ([place]) 08/19/2013  . Spinal stenosis, lumbar region, with neurogenic claudication 06/12/2012    Class: Diagnosis of  . DIABETES MELLITUS, TYPE II 03/15/2009  . ALCOHOL ABUSE 03/15/2009  . TOBACCO USE 03/15/2009  . HYPERTENSION 03/15/2009   PCP:  [name], MD Pharmacy:   [facility] [address], [place] [address] Phone: [phone] Fax: [phone]     Social Determinants of Health (SDOH) Interventions    Readmission Risk Interventions No flowsheet data found.

## 2020-02-19 NOTE — Op Note (Signed)
    NAME: Alexander [name]    MRN: [id number] DOB: 09-30-1961    DATE OF OPERATION: 02/19/2020  PREOP DIAGNOSIS:    Nonhealing wound of the right foot  POSTOP DIAGNOSIS:    Same  PROCEDURE:    Extensive debridement of lateral right foot involving skin, subcutaneous tissue, fascia, and the third metatarsal and third toe. Placement of VAC.  SURGEON: Judeth Cornfield. Scot Dock, MD  ASSIST: Risa Grill, PA  ANESTHESIA: General  EBL: Minimal  INDICATIONS:    [name] is a 59 y.o. male who developed a wound over the lateral aspect of his right foot and has had previous amputation of the fourth and fifth toes.  The wound is extensive and was not healing despite of VAC and aggressive wound care.  I felt that he would require a below the knee amputation however he felt strongly that he wanted to attempt debridement of the wound 1 last time before agreeing to proceed with amputation.  Of note he is undergone a previous right lower extremity bypass and also a tibial intervention and has good blood flow to the foot.  FINDINGS:   Excellent bleeding at the wound at the completion.  I removed the third metatarsal and the third toe.  TECHNIQUE:   The patient was taken to the operating room and received a general anesthetic.  The right foot was prepped and draped in usual sterile fashion.  I made a an incision with a 10 blade excising an ellipse of skin and subcutaneous tissue at the edges of the wound which were dry with dry gangrene.  The dissection was carried down to the remaining segment of the fourth metatarsal and also down to the third metatarsal.  The bone was dissected free and I excised the third toe buying making an elliptical incision encompassing the third toe as this too was involved.  I debrided all subcutaneous tissue tendon fascia skin sharply with a scalpel and scissors back to healthy bleeding tissue.  I rongeured the bone back to healthy tissue proximally.  Hemostasis was obtained  using electrocautery.  A VAC sponge was then placed.  The wound measured 15 cm in length by 6 cm in width by 4 cm in depth.  The VAC had a good seal.  He was transferred to the recovery room in stable condition.  [name], MD, FACS [facility]  DATE OF DICTATION:   02/19/2020

## 2020-02-19 NOTE — Anesthesia Postprocedure Evaluation (Signed)
Anesthesia Post Note  Patient: Alexander [name]  Procedure(s) Performed: DEBRIDEMENT WOUND RIGHT FOOT (Right ) AMPUTATION RIGHT 3RD TOE (Right ) Application Of Wound Vac Right foot (Right )     Patient location during evaluation: PACU Anesthesia Type: General Level of consciousness: awake and alert and oriented Pain management: pain level controlled Vital Signs Assessment: post-procedure vital signs reviewed and stable Respiratory status: spontaneous breathing, nonlabored ventilation and respiratory function stable Cardiovascular status: blood pressure returned to baseline and stable Postop Assessment: no apparent nausea or vomiting Anesthetic complications: no    Last Vitals:  Vitals:   02/19/20 1240 02/19/20 1255  BP: (!) 127/56 (!) 123/58  Pulse: 66 63  Resp: 13 13  Temp:    SpO2: 94% 95%    Last Pain:  Vitals:   02/19/20 1255  PainSc: 0-No pain                 Kwaku Mostafa A.

## 2020-02-19 NOTE — Anesthesia Procedure Notes (Addendum)
Procedure Name: LMA Insertion Date/Time: 02/19/2020 11:24 AM Performed by: Inda Coke, CRNA Pre-anesthesia Checklist: Patient identified, Emergency Drugs available, Suction available and Patient being monitored Patient Re-evaluated:Patient Re-evaluated prior to induction Oxygen Delivery Method: Circle System Utilized Preoxygenation: Pre-oxygenation with 100% oxygen Induction Type: IV induction Ventilation: Mask ventilation without difficulty LMA: LMA inserted LMA Size: 5.0 Number of attempts: 1 Placement Confirmation: positive ETCO2 Tube secured with: Tape Dental Injury: Teeth and Oropharynx as per pre-operative assessment

## 2020-02-20 DIAGNOSIS — E11621 Type 2 diabetes mellitus with foot ulcer: Secondary | ICD-10-CM | POA: Diagnosis not present

## 2020-02-20 DIAGNOSIS — Z89421 Acquired absence of other right toe(s): Secondary | ICD-10-CM | POA: Diagnosis not present

## 2020-02-20 DIAGNOSIS — I129 Hypertensive chronic kidney disease with stage 1 through stage 4 chronic kidney disease, or unspecified chronic kidney disease: Secondary | ICD-10-CM | POA: Diagnosis not present

## 2020-02-20 DIAGNOSIS — N183 Chronic kidney disease, stage 3 unspecified: Secondary | ICD-10-CM | POA: Diagnosis not present

## 2020-02-20 DIAGNOSIS — Z950 Presence of cardiac pacemaker: Secondary | ICD-10-CM | POA: Diagnosis not present

## 2020-02-20 DIAGNOSIS — L97519 Non-pressure chronic ulcer of other part of right foot with unspecified severity: Secondary | ICD-10-CM | POA: Diagnosis not present

## 2020-02-20 DIAGNOSIS — Z87891 Personal history of nicotine dependence: Secondary | ICD-10-CM | POA: Diagnosis not present

## 2020-02-20 DIAGNOSIS — M1990 Unspecified osteoarthritis, unspecified site: Secondary | ICD-10-CM | POA: Diagnosis not present

## 2020-02-20 DIAGNOSIS — I96 Gangrene, not elsewhere classified: Secondary | ICD-10-CM | POA: Diagnosis not present

## 2020-02-20 DIAGNOSIS — E1122 Type 2 diabetes mellitus with diabetic chronic kidney disease: Secondary | ICD-10-CM | POA: Diagnosis not present

## 2020-02-20 DIAGNOSIS — E1152 Type 2 diabetes mellitus with diabetic peripheral angiopathy with gangrene: Secondary | ICD-10-CM | POA: Diagnosis not present

## 2020-02-20 DIAGNOSIS — I2510 Atherosclerotic heart disease of native coronary artery without angina pectoris: Secondary | ICD-10-CM | POA: Diagnosis not present

## 2020-02-20 DIAGNOSIS — Z794 Long term (current) use of insulin: Secondary | ICD-10-CM | POA: Diagnosis not present

## 2020-02-20 LAB — BASIC METABOLIC PANEL
Anion gap: 12 (ref 5–15)
BUN: 69 mg/dL — ABNORMAL HIGH (ref 8–23)
CO2: 21 mmol/L — ABNORMAL LOW (ref 22–32)
Calcium: 8.4 mg/dL — ABNORMAL LOW (ref 8.9–10.3)
Chloride: 100 mmol/L (ref 98–111)
Creatinine, Ser: 2.97 mg/dL — ABNORMAL HIGH (ref 0.61–1.24)
GFR calc Af Amer: 25 mL/min — ABNORMAL LOW (ref >60)
GFR calc non Af Amer: 22 mL/min — ABNORMAL LOW (ref >60)
Glucose, Bld: 298 mg/dL — ABNORMAL HIGH (ref 70–99)
Potassium: 5 mmol/L (ref 3.5–5.1)
Sodium: 133 mmol/L — ABNORMAL LOW (ref 135–145)

## 2020-02-20 LAB — CBC
HCT: 25 % — ABNORMAL LOW (ref 39.0–52.0)
Hemoglobin: 7.8 g/dL — ABNORMAL LOW (ref 13.0–17.0)
MCH: 28 pg (ref 26.0–34.0)
MCHC: 31.2 g/dL (ref 30.0–36.0)
MCV: 89.6 fL (ref 80.0–100.0)
Platelets: 246 10*3/uL (ref 150–400)
RBC: 2.79 MIL/uL — ABNORMAL LOW (ref 4.22–5.81)
RDW: 14.8 % (ref 11.5–15.5)
WBC: 6.8 10*3/uL (ref 4.0–10.5)
nRBC: 0 % (ref 0.0–0.2)

## 2020-02-20 LAB — GLUCOSE, CAPILLARY
Glucose-Capillary: 266 mg/dL — ABNORMAL HIGH (ref 70–99)
Glucose-Capillary: 209 mg/dL — ABNORMAL HIGH (ref 70–99)

## 2020-02-20 MED ORDER — PNEUMOCOCCAL VAC POLYVALENT 25 MCG/0.5ML IJ INJ: 0.5000 mL | INJECTION | INTRAMUSCULAR | Status: DC

## 2020-02-20 MED ORDER — OXYCODONE-ACETAMINOPHEN 5-325 MG PO TABS: 1.0000 | ORAL_TABLET | Freq: Four times a day (QID) | ORAL | 0 refills | Status: DC | PRN

## 2020-02-20 MED ORDER — CIPROFLOXACIN HCL 250 MG PO TABS: 250.0000 mg | ORAL_TABLET | Freq: Two times a day (BID) | ORAL | 0 refills | Status: AC

## 2020-02-20 NOTE — Progress Notes (Signed)
Orthotics to bring up blue boot.

## 2020-02-20 NOTE — Progress Notes (Signed)
Orthopedic Tech Progress Note Patient Details:  Alexander [name] 01-Nov-1961 [id number]  Ortho Devices Type of Ortho Device: Prafo boot/shoe Ortho Device/Splint Interventions: Application   Post Interventions Patient Tolerated: Well Instructions Provided: Adjustment of device, Care of device   [name] 02/20/2020, 11:56 AM

## 2020-02-20 NOTE — Progress Notes (Addendum)
  Progress Note    02/20/2020 9:07 AM 1 Day Post-Op  Subjective:  Ready for discharge home   Vitals:   02/20/20 0400 02/20/20 0800  BP: (!) 136/57 (!) 129/56  Pulse: 76 74  Resp: (!) 22 18  Temp:  98.5 F (36.9 C)  SpO2: 96% 96%   Physical Exam: Lungs:  Non labored Incisions:  R foot with vac in place and good seal Neurologic: A&O  CBC    Component Value Date/Time   WBC 6.8 02/20/2020 0306   RBC 2.79 (L) 02/20/2020 0306   HGB 7.8 (L) 02/20/2020 0306   HCT 25.0 (L) 02/20/2020 0306   PLT 246 02/20/2020 0306   MCV 89.6 02/20/2020 0306   MCH 28.0 02/20/2020 0306   MCHC 31.2 02/20/2020 0306   RDW 14.8 02/20/2020 0306   LYMPHSABS 1.3 02/15/2020 1800   MONOABS 0.8 02/15/2020 1800   EOSABS 0.2 02/15/2020 1800   BASOSABS 0.1 02/15/2020 1800    BMET    Component Value Date/Time   NA 133 (L) 02/20/2020 0306   K 5.0 02/20/2020 0306   CL 100 02/20/2020 0306   CO2 21 (L) 02/20/2020 0306   GLUCOSE 298 (H) 02/20/2020 0306   BUN 69 (H) 02/20/2020 0306   CREATININE 2.97 (H) 02/20/2020 0306   CREATININE 1.52 (H) 02/07/2015 1151   CALCIUM 8.4 (L) 02/20/2020 0306   GFRNONAA 22 (L) 02/20/2020 0306   GFRNONAA 50 (L) 02/07/2015 1151   GFRAA 25 (L) 02/20/2020 0306   GFRAA 58 (L) 02/07/2015 1151    INR    Component Value Date/Time   INR 1.2 01/26/2020 0811     Intake/Output Summary (Last 24 hours) at 02/20/2020 0907 Last data filed at 02/20/2020 0354 Gross per 24 hour  Intake 860 ml  Output 925 ml  Net -65 ml     Assessment/Plan:  59 y.o. male is s/p R foot debridement and wound vac replacement 1 Day Post-Op   Wound vac in place R foot with good seal Darco shoe ordered TOC team submitted order to resume [facility] RN for wound vac change 3x/week Patient has wound vac pump at home;  Can send patient home with prevena pump if need be Rx provided for cipro  Alexander Ligas, PA-C [facility] [phone] 02/20/2020 9:07 AM  I have independently  interviewed and examined patient and agree with PA assessment and plan above.   Olinda Nola C. Donzetta Matters, MD [facility] [place] Office: [phone] Pager: [phone]

## 2020-02-20 NOTE — TOC Transition Note (Signed)
Transition of Care [facility]) - CM/SW Discharge Note   Patient Details  Name: Alexander [name] MRN: [id number] Date of Birth: [birth year]  Transition of Care [facility]) CM/SW Contact:  Claudie Leach, RN 02/20/2020, 11:47 AM   Clinical Narrative:    Patient to d/c today to prior services of [facility] RN and Wound vac through [facility].  [facility] aware of d/c today.   Patient is potential CC44.  UR is aware and unable to reach MD.  Patient is ready to leave.   Final next level of care: [place] Barriers to Discharge: Continued Medical Work up    Discharge Plan and Services   Discharge Planning Services: CM Consult Post Acute Care Choice: Home Health, Resumption of Svcs/PTA Provider          DME Arranged: Vac DME Agency: [facility](already have home wound VAC)       HH Arranged: RN HH Agency: [facility] Date [place]: 02/20/20 Time [place]: 37 Representative spoke with at [place]: [place] ([place]) Interventions     Readmission Risk Interventions No flowsheet data found.

## 2020-02-20 NOTE — Progress Notes (Signed)
Pt requesting a special Orthotic boot described as "blue boot".  Paged Dr. Hendricks Limes at this time for order.  Also trying to contact case manager, Caryl Pina, to make sure [place] is definitely set up.

## 2020-02-20 NOTE — Care Management CC44 (Signed)
Condition Code 44 Documentation Completed  Patient Details  Name: Alexander [name] MRN: [id number] Date of Birth: June 23, 1961   Condition Code 44 given:  Yes Patient signature on Condition Code 44 notice:  Yes Documentation of 2 MD's agreement:  Yes Code 44 added to claim:  Yes    Carles Collet, RN 02/20/2020, 12:04 PM

## 2020-02-20 NOTE — Evaluation (Signed)
Occupational Therapy Evaluation Patient Details Name: Alexander [name] MRN: [id number] DOB: [birth year] Today's Date: 02/20/2020    History of Present Illness This is a 59 y.o. male with hx DM, afib on eliquis, CKD, HTN, HLD and PVD that presents for evaluation of progressive tissue loss right lower extremity. Has undergone multiple attempts at revascularization to the RLE but presents due to concerns by the [facility] during wound vac change. Amp 3rd R toe 4/9.   Clinical Impression   This 59 y/o male presents with the above. PTA pt reports using [facility] for functional mobility and performing ADL without assist. Today pt completing room level mobility using RW at [facility] - supervision level. He currently requires minguard-minA for LB ADL given current LE deficits. Pt reports plans to return home with family who can assist with ADL PRN, reports has necessary DME as well. Educated pt in general WB precautions including WB through R heel and elevating RLE as much as possible after return home. Questions answered throughout session. Pt anticipating d/c home today.    Follow Up Recommendations  No OT follow up    Equipment Recommendations  None recommended by OT           Precautions / Restrictions Precautions Precautions: Fall Precaution Comments: pt with transmet other side Restrictions Weight Bearing Restrictions: Yes RLE Weight Bearing: Weight bearing as tolerated Other Position/Activity Restrictions: encourage heel WB'ing, MD to order post op shoe      Mobility Bed Mobility Overal bed mobility: Modified Independent             General bed mobility comments: no difficulties coming to EOB  Transfers Overall transfer level: Needs assistance Equipment used: Rolling walker (2 wheeled) Transfers: Sit to/from Stand Sit to Stand: Supervision         General transfer comment: vc's for hand placement and minimizing wt on RLE, no LOB    Balance Overall balance assessment: Needs  assistance Sitting-balance support: No upper extremity supported;Feet supported Sitting balance-Leahy Scale: Normal     Standing balance support: No upper extremity supported Standing balance-Leahy Scale: Fair Standing balance comment: with L transmet amp and now R toe amp and wound vac, pt with increased fall risk. Educated pt on this and need for use of RW at this time instead of cane                           ADL either performed or assessed with clinical judgement   ADL Overall ADL's : Needs assistance/impaired Eating/Feeding: Independent;Sitting   Grooming: Set up;Sitting   Upper Body Bathing: Set up;Supervision/ safety;Sitting Upper Body Bathing Details (indicate cue type and reason): pt reports mostly sponge bathing PTA Lower Body Bathing: Min guard;Minimal assistance;Sit to/from stand   Upper Body Dressing : Sitting;Modified independent   Lower Body Dressing: Minimal assistance;Sit to/from stand Lower Body Dressing Details (indicate cue type and reason): pt able to don L shoe via figure 4 Toilet Transfer: Min guard;Ambulation;RW Toilet Transfer Details (indicate cue type and reason): simulated via transfer to recliner, room level mobility  Toileting- Clothing Manipulation and Hygiene: Min guard;Sit to/from stand       Functional mobility during ADLs: Surveyor, minerals     Praxis      Pertinent Vitals/Pain Pain Assessment: Faces Faces Pain Scale: Hurts little more Pain Location: R toes Pain Descriptors / Indicators: Discomfort;Grimacing Pain  Intervention(s): Limited activity within patient's tolerance;Monitored during session     Hand Dominance Right   Extremity/Trunk Assessment Upper Extremity Assessment Upper Extremity Assessment: Overall WFL for tasks assessed   Lower Extremity Assessment Lower Extremity Assessment: Defer to PT evaluation RLE Deficits / Details: pt lacks full ankle df bilaterally,  hip flex 4/5 RLE Sensation: history of peripheral neuropathy RLE Coordination: decreased gross motor LLE Deficits / Details: lacks full active df L ankle, h/o transmet amp, wears socks in shoe. Grossly 4+/5 LLE Sensation: history of peripheral neuropathy LLE Coordination: decreased gross motor   Cervical / Trunk Assessment Cervical / Trunk Assessment: Normal   Communication Communication Communication: No difficulties   Cognition Arousal/Alertness: Awake/alert Behavior During Therapy: WFL for tasks assessed/performed Overall Cognitive Status: Within Functional Limits for tasks assessed                                     General Comments  SPO2 96% on RA, HR 93 bpm after ambulation. Discussed ambulating each hour but minimizing time up to <10 mins and then down with RLE elevation.     Exercises Exercises: General Lower Extremity General Exercises - Lower Extremity Ankle Circles/Pumps: AROM;Both;10 reps;Seated   Shoulder Instructions      Home Living Family/patient expects to be discharged to:: Private residence Living Arrangements: Spouse/significant other;Children Available Help at Discharge: Family Type of Home: House Home Access: Stairs to enter [employer] of Steps: 2 (one is half step) Entrance Stairs-Rails: None Home Layout: One level     Bathroom Shower/Tub: Occupational psychologist: Standard Bathroom Accessibility: Yes   Home Equipment: Environmental consultant - 2 wheels;Cane - single point;Bedside commode;Shower seat - built in;Grab bars - tub/shower;Grab bars - toilet          Prior Functioning/Environment Level of Independence: Independent with assistive device(s)        Comments: using SPC        OT Problem List: Decreased strength;Decreased range of motion;Decreased activity tolerance;Impaired balance (sitting and/or standing);Decreased safety awareness;Decreased knowledge of use of DME or AE;Pain      OT  Treatment/Interventions: Self-care/ADL training;Therapeutic exercise;Energy conservation;DME and/or AE instruction;Therapeutic activities;Patient/family education;Balance training    OT Goals(Current goals can be found in the care plan section) Acute Rehab OT Goals Patient Stated Goal: return home OT Goal Formulation: With patient Time For Goal Achievement: 03/05/20 Potential to Achieve Goals: Good  OT Frequency: Min 2X/week   Barriers to D/C:            Co-evaluation PT/OT/SLP Co-Evaluation/Treatment: Yes Reason for Co-Treatment: Complexity of the patient's impairments (multi-system involvement) PT goals addressed during session: Mobility/safety with mobility;Balance;Proper use of DME;Strengthening/ROM OT goals addressed during session: ADL's and self-care      AM-PAC OT "6 Clicks" Daily Activity     Outcome Measure Help from another person eating meals?: None Help from another person taking care of personal grooming?: A Little Help from another person toileting, which includes using toliet, bedpan, or urinal?: A Little Help from another person bathing (including washing, rinsing, drying)?: A Little Help from another person to put on and taking off regular upper body clothing?: None Help from another person to put on and taking off regular lower body clothing?: A Little 6 Click Score: 20   End of Session Equipment Utilized During Treatment: Rolling walker Nurse Communication: Mobility status  Activity Tolerance: Patient tolerated treatment well Patient left: in chair;with call  bell/phone within reach  OT Visit Diagnosis: Other abnormalities of gait and mobility (R26.89);Pain Pain - Right/Left: Right Pain - part of body: Ankle and joints of foot                Time: 1595-3967 OT Time Calculation (min): 31 min Charges:  OT General Charges $OT Visit: 1 Visit OT Evaluation $OT Eval Moderate Complexity: [place], OT Acute Rehabilitation Services Pager  [phone] Office [place] 02/20/2020, 1:15 PM

## 2020-02-20 NOTE — Evaluation (Signed)
Physical Therapy Evaluation Patient Details Name: Alexander [name] MRN: [id number] DOB: 1961-06-19 Today's Date: 02/20/2020   History of Present Illness  This is a 59 y.o. male with hx DM, afib on eliquis, CKD, HTN, HLD and PVD that presents for evaluation of progressive tissue loss right lower extremity. Has undergone multiple attempts at revascularization to the RLE but presents due to concerns by the [facility] during wound vac change. Amp 3rd R toe 4/9.  Clinical Impression  Patient evaluated by Physical Therapy with no further acute PT needs identified. All education has been completed and the patient has no further questions. Pt mobilizing with supervision with use of RW to minimize WB'ing RLE. One LOB while up with self correction. Discussed activity level upon return home as well as RLE elevation. Pt will have supervision from family.  See below for any follow-up Physical Therapy or equipment needs. PT is signing off. Thank you for this referral.     Follow Up Recommendations No PT follow up    Equipment Recommendations  None recommended by PT    Recommendations for Other Services       Precautions / Restrictions Precautions Precautions: Fall Precaution Comments: pt with transmet other side Restrictions Weight Bearing Restrictions: Yes RLE Weight Bearing: Weight bearing as tolerated Other Position/Activity Restrictions: encourage heel WB'ing, MD to order post op shoe      Mobility  Bed Mobility Overal bed mobility: Modified Independent             General bed mobility comments: no difficulties coming to EOB  Transfers Overall transfer level: Needs assistance Equipment used: Rolling walker (2 wheeled) Transfers: Sit to/from Stand Sit to Stand: Supervision         General transfer comment: vc's for hand placement and minimizing wt on RLE, no LOB  Ambulation/Gait Ambulation/Gait assistance: Min guard Gait Distance (Feet): 30 Feet Assistive device: Rolling walker (2  wheeled) Gait Pattern/deviations: Step-to pattern;Decreased dorsiflexion - right;Decreased dorsiflexion - left Gait velocity: decreased Gait velocity interpretation: <1.8 ft/sec, indicate of risk for recurrent falls General Gait Details: pt with 1 LOB with self correction with use of RW. vc's for taking wt through UE's to minimize wt through RLE  Stairs            Wheelchair Mobility    Modified Rankin (Stroke Patients Only)       Balance Overall balance assessment: Needs assistance Sitting-balance support: No upper extremity supported;Feet supported Sitting balance-Leahy Scale: Normal     Standing balance support: No upper extremity supported Standing balance-Leahy Scale: Fair Standing balance comment: with L transmet amp and now R toe amp and wound vac, pt with increased fall risk. Educated pt on this and need for use of RW at this time instead of cane                             Pertinent Vitals/Pain Pain Assessment: Faces Faces Pain Scale: Hurts little more Pain Location: R toes Pain Descriptors / Indicators: Discomfort;Grimacing Pain Intervention(s): Limited activity within patient's tolerance;Monitored during session    Home Living Family/patient expects to be discharged to:: Private residence Living Arrangements: Spouse/significant other;Children Available Help at Discharge: Family Type of Home: House Home Access: Stairs to enter Entrance Stairs-Rails: None Entrance Stairs-Number of Steps: 2 (one is half step) Home Layout: One level Home Equipment: Walker - 2 wheels;Cane - single point;Bedside commode;Shower seat - built in;Grab bars - tub/shower;Grab bars - toilet  Prior Function Level of Independence: Independent with assistive device(s)         Comments: using SPC     Hand Dominance   Dominant Hand: Right    Extremity/Trunk Assessment   Upper Extremity Assessment Upper Extremity Assessment: Defer to OT evaluation    Lower  Extremity Assessment Lower Extremity Assessment: RLE deficits/detail;LLE deficits/detail RLE Deficits / Details: pt lacks full ankle df bilaterally, hip flex 4/5 RLE Sensation: history of peripheral neuropathy RLE Coordination: decreased gross motor LLE Deficits / Details: lacks full active df L ankle, h/o transmet amp, wears socks in shoe. Grossly 4+/5 LLE Sensation: history of peripheral neuropathy LLE Coordination: decreased gross motor    Cervical / Trunk Assessment Cervical / Trunk Assessment: Normal  Communication   Communication: No difficulties  Cognition Arousal/Alertness: Awake/alert Behavior During Therapy: WFL for tasks assessed/performed Overall Cognitive Status: Within Functional Limits for tasks assessed                                        General Comments General comments (skin integrity, edema, etc.): SPO2 96% on RA, HR 93 bpm after ambulation. Discussed ambulating each hour but minimizing time up to <10 mins and then down with RLE elevation.     Exercises General Exercises - Lower Extremity Ankle Circles/Pumps: AROM;Both;10 reps;Seated   Assessment/Plan    PT Assessment Patent does not need any further PT services  PT Problem List         PT Treatment Interventions      PT Goals (Current goals can be found in the Care Plan section)  Acute Rehab PT Goals Patient Stated Goal: return home PT Goal Formulation: All assessment and education complete, DC therapy    Frequency     Barriers to discharge        Co-evaluation PT/OT/SLP Co-Evaluation/Treatment: Yes Reason for Co-Treatment: Complexity of the patient's impairments (multi-system involvement) PT goals addressed during session: Mobility/safety with mobility;Balance;Proper use of DME;Strengthening/ROM         AM-PAC PT "6 Clicks" Mobility  Outcome Measure Help needed turning from your back to your side while in a flat bed without using bedrails?: None Help needed moving from  lying on your back to sitting on the side of a flat bed without using bedrails?: None Help needed moving to and from a bed to a chair (including a wheelchair)?: None Help needed standing up from a chair using your arms (e.g., wheelchair or bedside chair)?: None Help needed to walk in hospital room?: A Little Help needed climbing 3-5 steps with a railing? : A Little 6 Click Score: 22    End of Session   Activity Tolerance: Patient tolerated treatment well Patient left: in chair;with call bell/phone within reach Nurse Communication: Mobility status PT Visit Diagnosis: Unsteadiness on feet (R26.81);Difficulty in walking, not elsewhere classified (R26.2);Pain Pain - Right/Left: Right Pain - part of body: Ankle and joints of foot    Time: 0832-0903 PT Time Calculation (min) (ACUTE ONLY): 31 min   Charges:   PT Evaluation $PT Eval Moderate Complexity: Cross  Pager [phone] Office [place] 02/20/2020, 10:38 AM

## 2020-02-20 NOTE — Progress Notes (Signed)
Orthopedic Tech Progress Note Patient Details:  MADHAV MOHON 06/17/1961 [id number] Patient has [facility], patient wanted a regular POST OP SHOE. Told patient I would reach out to MD and see if we could change out shoe Patient ID: [name], male   DOB: 06-14-1961, 59 y.o.   MRN: [id number]   [name] 02/20/2020, 10:14 AM

## 2020-02-20 NOTE — Care Management Obs Status (Signed)
[place] [facility] NOTIFICATION   Patient Details  Name: CHIEF WALKUP MRN: [id number] Date of Birth: 1961/10/01   Medicare Observation Status Notification Given:  Yes    Carles Collet, RN 02/20/2020, 12:04 PM

## 2020-02-22 ENCOUNTER — Telehealth: Payer: Self-pay

## 2020-02-22 ENCOUNTER — Telehealth: Payer: Self-pay | Admitting: Vascular Surgery

## 2020-02-22 ENCOUNTER — Encounter (HOSPITAL_COMMUNITY)
Admission: RE | Admit: 2020-02-22 | Discharge: 2020-02-22 | Disposition: A | Discharge status: Home / Self Care | Payer: Medicare Other | Source: Ambulatory Visit | Attending: Nephrology | Admitting: Nephrology

## 2020-02-22 VITALS — BP 101/50 | HR 62 | Temp 96.8°F | Resp 18

## 2020-02-22 DIAGNOSIS — E1122 Type 2 diabetes mellitus with diabetic chronic kidney disease: Secondary | ICD-10-CM | POA: Insufficient documentation

## 2020-02-22 DIAGNOSIS — N183 Chronic kidney disease, stage 3 unspecified: Secondary | ICD-10-CM | POA: Insufficient documentation

## 2020-02-22 LAB — POCT I-STAT, CHEM 8
BUN: 54 mg/dL — ABNORMAL HIGH (ref 8–23)
Calcium, Ion: 1.31 mmol/L (ref 1.15–1.40)
Chloride: 103 mmol/L (ref 98–111)
Creatinine, Ser: 2.6 mg/dL — ABNORMAL HIGH (ref 0.61–1.24)
Glucose, Bld: 237 mg/dL — ABNORMAL HIGH (ref 70–99)
HCT: 35 % — ABNORMAL LOW (ref 39.0–52.0)
Hemoglobin: 11.9 g/dL — ABNORMAL LOW (ref 13.0–17.0)
Potassium: 4.6 mmol/L (ref 3.5–5.1)
Sodium: 135 mmol/L (ref 135–145)
TCO2: 22 mmol/L (ref 22–32)

## 2020-02-22 LAB — IRON AND TIBC
Iron: 24 ug/dL — ABNORMAL LOW (ref 45–182)
Saturation Ratios: 17 % — ABNORMAL LOW (ref 17.9–39.5)
TIBC: 143 ug/dL — ABNORMAL LOW (ref 250–450)
UIBC: 119 ug/dL

## 2020-02-22 LAB — FERRITIN: Ferritin: 595 ng/mL — ABNORMAL HIGH (ref 24–336)

## 2020-02-22 LAB — POCT HEMOGLOBIN-HEMACUE: Hemoglobin: 7.7 g/dL — ABNORMAL LOW (ref 13.0–17.0)

## 2020-02-22 MED ORDER — DARBEPOETIN ALFA 200 MCG/0.4ML IJ SOSY
PREFILLED_SYRINGE | INTRAMUSCULAR | Status: AC
  Filled 2020-02-22: qty 0.4

## 2020-02-22 MED ORDER — DARBEPOETIN ALFA 200 MCG/0.4ML IJ SOSY
200.0000 ug | PREFILLED_SYRINGE | INTRAMUSCULAR | Status: DC
  Administered 2020-02-22: 10:00:00 200 ug via SUBCUTANEOUS

## 2020-02-22 NOTE — Telephone Encounter (Signed)
Pts daughter called and said that the home health nurse was out today and that she had taken off the wound vac to change the pad but could not get the wound to stop bleeding. Stated that she left the wound vac off and applied a wet to dry dressing and said that she was going to call someone else to come out and look at the wound but since she left the wound has bled through the dressing and they cannot get it to stop.   Advised daughter to take him to the ER for evaluation and to control the bleeding but stated that they had already heard from Dr Oneida Alar who is on call and he is suppose to call them back after he finishes in the OR. They said that they really did not want to go to the ER.  Sent a message to Dr Oneida Alar to advise   Alexander [name], [place]

## 2020-02-22 NOTE — Telephone Encounter (Signed)
Pt wife called saying he was bleeding from TMA wound after VAC change today.  Pt is on Eliquis.  They are going to hold pressure for 15 min.  If still bleeding at that point they will go to the [facility] ER.  Ruta Hinds, MD

## 2020-02-22 NOTE — Progress Notes (Signed)
Notified Amber / Dr. Justin Mend at [facility] of HGB7.7, patient denies any bleeding or SOB.  Per Dr. Justin Mend we should give regular dose of aranesp today.

## 2020-02-23 ENCOUNTER — Telehealth: Payer: Self-pay

## 2020-02-23 DIAGNOSIS — T8189XA Other complications of procedures, not elsewhere classified, initial encounter: Secondary | ICD-10-CM | POA: Diagnosis not present

## 2020-02-23 NOTE — Telephone Encounter (Addendum)
I called [facility] regarding Dr. Nicole Cella response.  The Rep transferred me to voicemail.  I left 2 messages.  Thurston Hole., LPN  ----- Message from Angelia Mould, MD sent at 02/23/2020 12:34 PM EDT ----- Regarding: RE: Wound VAC He needs the wound VAC. Thanks CD ----- Message ----- From: Kaleen Mask, LPN Sent: 9/75/3005  11:03 AM EDT To: Angelia Mould, MD Subject: Wound VAC                                      This pt has an appt with Scot Dock on 4-28.  His wife left message that Alta did not put his wound Vac back on and just left.  Looking back at his telephone notes, did you want the wound vac back on or just stick to wet to dry dressings?  Please advise.  Thanks,  Thurston Hole., LPN

## 2020-02-23 NOTE — Discharge Summary (Signed)
Discharge Summary  Patient ID: Alexander [name] [id number] 59 y.o. 1961-04-05  Admit date: 02/19/2020  Discharge date and time: 02/20/2020 12:38 PM   Admitting Physician: Angelia Mould, MD   Discharge Physician: same  Admission Diagnoses: Diabetic foot ulcer associated with type 2 diabetes mellitus ([place]) [E11.621, L97.509]  Discharge Diagnoses: same  Admission Condition: fair  Discharged Condition: fair  Indication for Admission: Nonhealing wound of the right foot  Hospital Course: [name] is a 59 year old male who was brought in as an outpatient due to nonhealing wound of the right foot and underwent extensive debridement and placement of wound VAC.  He tolerated the procedure well and was kept overnight.  POD #1 Home health RN was contacted and resumed to help with wound VAC dressing changes 3 times a week.  He will follow up in office to see Dr. Scot Dock in about 2 weeks for wound check.  Cipro antibiotic regimen was reordered.  He was also provided pain medication for continued postoperative pain control.  He will be again heel weightbearing only.  He will be discharged home in stable condition.  Consults: None  Treatments: surgery: Extensive debridement of right foot and placement of wound VAC by Dr. Scot Dock on 02/19/2020  Discharge Exam: See progress note 02/20/20 Vitals:   02/20/20 1000 02/20/20 1100  BP:    Pulse: 73 61  Resp:    Temp:    SpO2: 97% 98%     Disposition: Discharge disposition: 01-Home or Self Care       Patient Instructions:  Allergies as of 02/20/2020      Reactions   Keflex [cephalexin] Nausea And Vomiting   Pletal [cilostazol] Palpitations   "Can hear heart beating loudly".   Sulfa Antibiotics Rash      Medication List    TAKE these medications   Accu-Chek Advantage Diabetes kit Use as instructed   Accu-Chek FastClix Lancets Misc Check blood sugar TID & QHS   apixaban 5 MG Tabs tablet Commonly known as: Eliquis Take  1 tablet (5 mg total) by mouth 2 (two) times daily. DO NOT TAKE THIS MEDICATION UNTIL WE RESTART IT AFTER YOUR SURGERY.   aspirin 81 MG EC tablet Take 81 mg by mouth daily.   atorvastatin 20 MG tablet Commonly known as: LIPITOR Take 1 tablet by mouth once daily   blood glucose meter kit and supplies Kit Check blood sugar TID & QHS   ciprofloxacin 250 MG tablet Commonly known as: Cipro Take 1 tablet (250 mg total) by mouth 2 (two) times daily for 14 days.   clopidogrel 75 MG tablet Commonly known as: Plavix Take 1 tablet (75 mg total) by mouth daily.   furosemide 40 MG tablet Commonly known as: LASIX Take 40 mg by mouth 2 (two) times daily.   glucose blood test strip Use as instructed   glucose blood test strip Commonly known as: Accu-Chek Aviva Plus Use as instructed   glucose blood test strip Commonly known as: Choice DM [place] G20 Test Strips Check blood sugar TID & QHS   hydrALAZINE 100 MG tablet Commonly known as: APRESOLINE TAKE 1 TABLET BY MOUTH THREE TIMES DAILY What changed: when to take this   lisinopril 10 MG tablet Commonly known as: ZESTRIL Take 1 tablet (10 mg total) by mouth daily.   metoprolol tartrate 100 MG tablet Commonly known as: LOPRESSOR Take 1 tablet by mouth twice daily   multivitamin with minerals Tabs tablet Take 1 tablet by mouth daily.  NOVOLIN 70/30 [place] Inject 46-56 Units into the skin See admin instructions. Inject 56 units subcutaneously in the morning & inject 46 units subcutaneously in the evening.   oxyCODONE-acetaminophen 5-325 MG tablet Commonly known as: Percocet Take 1 tablet by mouth every 6 (six) hours as needed for moderate pain.   prazosin 2 MG capsule Commonly known as: MINIPRESS TAKE 1 CAPSULE BY MOUTH EVERY  EVENING AFTER SUPPER What changed: See the new instructions.      Activity: activity as tolerated Diet: regular diet Wound Care: Wound VAC to be assessed by home health nurse and changed 3 times a  week  Follow-up with Dr. Scot Dock in 2 weeks.  Signed: Dagoberto Ligas, PA-C 02/23/2020 12:50 PM [facility] Office: [phone]

## 2020-02-24 ENCOUNTER — Other Ambulatory Visit: Payer: Self-pay | Admitting: Cardiology

## 2020-02-29 DIAGNOSIS — I4891 Unspecified atrial fibrillation: Secondary | ICD-10-CM | POA: Diagnosis not present

## 2020-02-29 DIAGNOSIS — M48062 Spinal stenosis, lumbar region with neurogenic claudication: Secondary | ICD-10-CM | POA: Diagnosis not present

## 2020-02-29 DIAGNOSIS — Z48812 Encounter for surgical aftercare following surgery on the circulatory system: Secondary | ICD-10-CM | POA: Diagnosis not present

## 2020-02-29 DIAGNOSIS — F1010 Alcohol abuse, uncomplicated: Secondary | ICD-10-CM | POA: Diagnosis not present

## 2020-02-29 DIAGNOSIS — I129 Hypertensive chronic kidney disease with stage 1 through stage 4 chronic kidney disease, or unspecified chronic kidney disease: Secondary | ICD-10-CM | POA: Diagnosis not present

## 2020-02-29 DIAGNOSIS — N183 Chronic kidney disease, stage 3 unspecified: Secondary | ICD-10-CM | POA: Diagnosis not present

## 2020-02-29 DIAGNOSIS — E1122 Type 2 diabetes mellitus with diabetic chronic kidney disease: Secondary | ICD-10-CM | POA: Diagnosis not present

## 2020-02-29 DIAGNOSIS — M1990 Unspecified osteoarthritis, unspecified site: Secondary | ICD-10-CM | POA: Diagnosis not present

## 2020-02-29 DIAGNOSIS — F17210 Nicotine dependence, cigarettes, uncomplicated: Secondary | ICD-10-CM | POA: Diagnosis not present

## 2020-02-29 DIAGNOSIS — I2510 Atherosclerotic heart disease of native coronary artery without angina pectoris: Secondary | ICD-10-CM | POA: Diagnosis not present

## 2020-02-29 DIAGNOSIS — E1151 Type 2 diabetes mellitus with diabetic peripheral angiopathy without gangrene: Secondary | ICD-10-CM | POA: Diagnosis not present

## 2020-02-29 DIAGNOSIS — L97516 Non-pressure chronic ulcer of other part of right foot with bone involvement without evidence of necrosis: Secondary | ICD-10-CM | POA: Diagnosis not present

## 2020-02-29 DIAGNOSIS — I495 Sick sinus syndrome: Secondary | ICD-10-CM | POA: Diagnosis not present

## 2020-02-29 DIAGNOSIS — D631 Anemia in chronic kidney disease: Secondary | ICD-10-CM | POA: Diagnosis not present

## 2020-02-29 DIAGNOSIS — I70235 Atherosclerosis of native arteries of right leg with ulceration of other part of foot: Secondary | ICD-10-CM | POA: Diagnosis not present

## 2020-02-29 DIAGNOSIS — Z4781 Encounter for orthopedic aftercare following surgical amputation: Secondary | ICD-10-CM | POA: Diagnosis not present

## 2020-03-01 ENCOUNTER — Telehealth: Payer: Self-pay | Admitting: Surgery

## 2020-03-01 MED ORDER — DOXYCYCLINE HYCLATE 100 MG PO CAPS: 100.0000 mg | ORAL_CAPSULE | Freq: Two times a day (BID) | ORAL | 0 refills | Status: DC

## 2020-03-01 NOTE — Telephone Encounter (Signed)
[facility] called and was concerned about infection in the foot with wound vac.  I perscribed Doxy and asked to schedule a visit this week to see a provider  Annamarie Major

## 2020-03-03 ENCOUNTER — Ambulatory Visit: Payer: Medicare Other

## 2020-03-04 ENCOUNTER — Other Ambulatory Visit (HOSPITAL_COMMUNITY): Payer: Self-pay

## 2020-03-07 ENCOUNTER — Other Ambulatory Visit: Payer: Self-pay

## 2020-03-07 ENCOUNTER — Encounter (HOSPITAL_COMMUNITY)
Admission: RE | Admit: 2020-03-07 | Discharge: 2020-03-07 | Disposition: A | Discharge status: Home / Self Care | Payer: Medicare Other | Source: Ambulatory Visit | Attending: Nephrology | Admitting: Nephrology

## 2020-03-07 VITALS — BP 109/49 | HR 66 | Temp 97.4°F | Resp 18

## 2020-03-07 DIAGNOSIS — N183 Chronic kidney disease, stage 3 unspecified: Secondary | ICD-10-CM

## 2020-03-07 DIAGNOSIS — E1122 Type 2 diabetes mellitus with diabetic chronic kidney disease: Secondary | ICD-10-CM | POA: Diagnosis not present

## 2020-03-07 LAB — POCT HEMOGLOBIN-HEMACUE: Hemoglobin: 7 g/dL — ABNORMAL LOW (ref 13.0–17.0)

## 2020-03-07 MED ORDER — DARBEPOETIN ALFA 200 MCG/0.4ML IJ SOSY: 200.0000 ug | PREFILLED_SYRINGE | INTRAMUSCULAR | Status: DC

## 2020-03-07 MED ORDER — SODIUM CHLORIDE 0.9 % IV SOLN
510.0000 mg | INTRAVENOUS | Status: DC
  Administered 2020-03-07: 510 mg via INTRAVENOUS
  Filled 2020-03-07: qty 17

## 2020-03-07 MED ORDER — DARBEPOETIN ALFA 200 MCG/0.4ML IJ SOSY
PREFILLED_SYRINGE | INTRAMUSCULAR | Status: AC
  Administered 2020-03-07: 200 ug via SUBCUTANEOUS
  Filled 2020-03-07: qty 0.4

## 2020-03-07 NOTE — Progress Notes (Signed)
Pt stated he was not notified by [facility] that he would be receiving IV iron today.

## 2020-03-09 ENCOUNTER — Ambulatory Visit (INDEPENDENT_AMBULATORY_CARE_PROVIDER_SITE_OTHER): Payer: Self-pay | Admitting: Vascular Surgery

## 2020-03-09 ENCOUNTER — Other Ambulatory Visit: Payer: Self-pay

## 2020-03-09 ENCOUNTER — Encounter: Payer: Self-pay | Admitting: Vascular Surgery

## 2020-03-09 ENCOUNTER — Ambulatory Visit: Payer: Medicare Other | Admitting: Vascular Surgery

## 2020-03-09 VITALS — BP 91/50 | HR 75 | Temp 97.6°F | Resp 20 | Ht 72.0 in | Wt 265.0 lb

## 2020-03-09 DIAGNOSIS — I70261 Atherosclerosis of native arteries of extremities with gangrene, right leg: Secondary | ICD-10-CM

## 2020-03-09 DIAGNOSIS — I739 Peripheral vascular disease, unspecified: Secondary | ICD-10-CM

## 2020-03-09 NOTE — Progress Notes (Signed)
Patient name: Alexander [name] MRN: [id number] DOB: November 17, 1960 Sex: male  REASON FOR VISIT:   Follow-up of right foot wound.  HPI:   Alexander [name] is a pleasant 59 y.o. male who we have been following with a wound on the lateral aspect of his right foot.  This wound has failed to heal despite aggressive wound care and use of the [facility]  We had recommended below the knee amputation however he felt strongly about 1 further attempt at debridement before considering amputation.  He underwent extensive debridement on 02/19/2020 with placement of a VAC and comes in for a follow-up visit.  Since I saw him last he does have some pain associated with the wound on his right foot.  There is also been some odor to the wound.  The VAC is being changed Monday Wednesdays and Fridays.  Current Outpatient Medications  Medication Sig Dispense Refill  . ACCU-CHEK FASTCLIX LANCETS MISC Check blood sugar TID & QHS 102 each 2  . apixaban (ELIQUIS) 5 MG TABS tablet Take 1 tablet (5 mg total) by mouth 2 (two) times daily. DO NOT TAKE THIS MEDICATION UNTIL WE RESTART IT AFTER YOUR SURGERY.    Marland Kitchen aspirin 81 MG EC tablet Take 81 mg by mouth daily.     Marland Kitchen atorvastatin (LIPITOR) 20 MG tablet Take 1 tablet by mouth once daily (Patient taking differently: Take 20 mg by mouth daily. ) 90 tablet 2  . blood glucose meter kit and supplies KIT Check blood sugar TID & QHS 1 each 0  . Blood Glucose Monitoring Suppl (ACCU-CHEK ADVANTAGE DIABETES) kit Use as instructed 1 each 0  . clopidogrel (PLAVIX) 75 MG tablet Take 1 tablet (75 mg total) by mouth daily. 30 tablet 0  . furosemide (LASIX) 40 MG tablet Take 40 mg by mouth 2 (two) times daily.    Marland Kitchen glucose blood (ACCU-CHEK AVIVA PLUS) test strip Use as instructed 100 each 12  . glucose blood (CHOICE DM FORA G20 TEST STRIPS) test strip Check blood sugar TID & QHS 100 each 12  . glucose blood test strip Use as instructed 100 each 12  . hydrALAZINE (APRESOLINE) 100 MG tablet TAKE 1 TABLET  BY MOUTH THREE TIMES DAILY 270 tablet 0  . Insulin NPH Isophane & Regular (NOVOLIN 70/30 [place]) Inject 46-56 Units into the skin See admin instructions. Inject 56 units subcutaneously in the morning & inject 46 units subcutaneously in the evening.    Marland Kitchen lisinopril (ZESTRIL) 10 MG tablet Take 1 tablet (10 mg total) by mouth daily.    . metoprolol tartrate (LOPRESSOR) 100 MG tablet Take 1 tablet by mouth twice daily (Patient taking differently: Take 100 mg by mouth 2 (two) times daily. ) 180 tablet 0  . Multiple Vitamin (MULTIVITAMIN WITH MINERALS) TABS tablet Take 1 tablet by mouth daily.    Marland Kitchen oxyCODONE-acetaminophen (PERCOCET) 5-325 MG tablet Take 1 tablet by mouth every 6 (six) hours as needed for moderate pain. 15 tablet 0  . prazosin (MINIPRESS) 2 MG capsule TAKE 1 CAPSULE BY MOUTH IN THE EVENING AFTER SUPPER 90 capsule 0   No current facility-administered medications for this visit.    REVIEW OF SYSTEMS:  '[X]'  denotes positive finding, '[ ]'  denotes negative finding Vascular    Leg swelling    Cardiac    Chest pain or chest pressure:    Shortness of breath upon exertion:    Short of breath when lying flat:    Irregular heart rhythm:  Constitutional    Fever or chills:     PHYSICAL EXAM:   Vitals:   03/09/20 1027  BP: (!) 91/50  Pulse: 75  Resp: 20  Temp: 97.6 F (36.4 C)  SpO2: 95%  Weight: 265 lb (120.2 kg)  Height: 6' (1.829 m)    GENERAL: The patient is a well-nourished male, in no acute distress. The vital signs are documented above. CARDIOVASCULAR: There is a regular rate and rhythm. PULMONARY: There is good air exchange bilaterally without wheezing or rales. VASCULAR: There is reasonable granulation tissue over 90% of the wound however unfortunately there is now developing a wound on the dorsum of the foot adjacent to the tendons.  In addition there is necrotic tissue on the most distal aspect of the wound.     DATA:   No new data  MEDICAL ISSUES:   PERIPHERAL  VASCULAR DISEASE WITH GANGRENE RIGHT FOOT: Despite revascularization and aggressive attempts at debridement with VAC placement I think the best option is to proceed with below the knee amputation and the patient is agreeable.  I have discussed this with Dr. Meridee Score will try to get him on the schedule later this week.  [name] [facility] [phone]

## 2020-03-10 ENCOUNTER — Encounter (HOSPITAL_COMMUNITY): Payer: Self-pay | Admitting: Orthopedic Surgery

## 2020-03-10 ENCOUNTER — Encounter: Payer: Self-pay | Admitting: Orthopedic Surgery

## 2020-03-10 ENCOUNTER — Ambulatory Visit: Payer: Medicare Other | Admitting: Orthopedic Surgery

## 2020-03-10 ENCOUNTER — Other Ambulatory Visit (HOSPITAL_COMMUNITY)
Admission: RE | Admit: 2020-03-10 | Discharge: 2020-03-10 | Disposition: A | Discharge status: Home / Self Care | Payer: Medicare Other | Source: Ambulatory Visit | Attending: Orthopedic Surgery | Admitting: Orthopedic Surgery

## 2020-03-10 ENCOUNTER — Other Ambulatory Visit: Payer: Self-pay

## 2020-03-10 ENCOUNTER — Other Ambulatory Visit: Payer: Self-pay | Admitting: Physician Assistant

## 2020-03-10 ENCOUNTER — Other Ambulatory Visit: Payer: Self-pay | Admitting: *Deleted

## 2020-03-10 VITALS — Ht 72.0 in | Wt 265.0 lb

## 2020-03-10 DIAGNOSIS — Y835 Amputation of limb(s) as the cause of abnormal reaction of the patient, or of later complication, without mention of misadventure at the time of the procedure: Secondary | ICD-10-CM | POA: Diagnosis present

## 2020-03-10 DIAGNOSIS — N179 Acute kidney failure, unspecified: Secondary | ICD-10-CM | POA: Diagnosis not present

## 2020-03-10 DIAGNOSIS — Z881 Allergy status to other antibiotic agents status: Secondary | ICD-10-CM | POA: Diagnosis not present

## 2020-03-10 DIAGNOSIS — D62 Acute posthemorrhagic anemia: Secondary | ICD-10-CM | POA: Diagnosis not present

## 2020-03-10 DIAGNOSIS — Z833 Family history of diabetes mellitus: Secondary | ICD-10-CM | POA: Diagnosis not present

## 2020-03-10 DIAGNOSIS — E785 Hyperlipidemia, unspecified: Secondary | ICD-10-CM | POA: Diagnosis not present

## 2020-03-10 DIAGNOSIS — Z87891 Personal history of nicotine dependence: Secondary | ICD-10-CM | POA: Diagnosis not present

## 2020-03-10 DIAGNOSIS — I739 Peripheral vascular disease, unspecified: Secondary | ICD-10-CM

## 2020-03-10 DIAGNOSIS — T8781 Dehiscence of amputation stump: Secondary | ICD-10-CM | POA: Diagnosis not present

## 2020-03-10 DIAGNOSIS — I70238 Atherosclerosis of native arteries of right leg with ulceration of other part of lower right leg: Secondary | ICD-10-CM | POA: Diagnosis not present

## 2020-03-10 DIAGNOSIS — Z8249 Family history of ischemic heart disease and other diseases of the circulatory system: Secondary | ICD-10-CM | POA: Diagnosis not present

## 2020-03-10 DIAGNOSIS — Z01812 Encounter for preprocedural laboratory examination: Secondary | ICD-10-CM | POA: Insufficient documentation

## 2020-03-10 DIAGNOSIS — Z841 Family history of disorders of kidney and ureter: Secondary | ICD-10-CM | POA: Diagnosis not present

## 2020-03-10 DIAGNOSIS — E1169 Type 2 diabetes mellitus with other specified complication: Secondary | ICD-10-CM | POA: Diagnosis not present

## 2020-03-10 DIAGNOSIS — Z89421 Acquired absence of other right toe(s): Secondary | ICD-10-CM | POA: Diagnosis not present

## 2020-03-10 DIAGNOSIS — I2510 Atherosclerotic heart disease of native coronary artery without angina pectoris: Secondary | ICD-10-CM | POA: Diagnosis not present

## 2020-03-10 DIAGNOSIS — E1142 Type 2 diabetes mellitus with diabetic polyneuropathy: Secondary | ICD-10-CM

## 2020-03-10 DIAGNOSIS — Z955 Presence of coronary angioplasty implant and graft: Secondary | ICD-10-CM | POA: Diagnosis not present

## 2020-03-10 DIAGNOSIS — E1152 Type 2 diabetes mellitus with diabetic peripheral angiopathy with gangrene: Secondary | ICD-10-CM | POA: Diagnosis not present

## 2020-03-10 DIAGNOSIS — G8918 Other acute postprocedural pain: Secondary | ICD-10-CM | POA: Diagnosis not present

## 2020-03-10 DIAGNOSIS — Z888 Allergy status to other drugs, medicaments and biological substances status: Secondary | ICD-10-CM | POA: Diagnosis not present

## 2020-03-10 DIAGNOSIS — Z882 Allergy status to sulfonamides status: Secondary | ICD-10-CM | POA: Diagnosis not present

## 2020-03-10 DIAGNOSIS — I482 Chronic atrial fibrillation, unspecified: Secondary | ICD-10-CM | POA: Diagnosis not present

## 2020-03-10 DIAGNOSIS — Z89511 Acquired absence of right leg below knee: Secondary | ICD-10-CM | POA: Diagnosis not present

## 2020-03-10 DIAGNOSIS — I96 Gangrene, not elsewhere classified: Secondary | ICD-10-CM | POA: Diagnosis not present

## 2020-03-10 DIAGNOSIS — Z89422 Acquired absence of other left toe(s): Secondary | ICD-10-CM | POA: Diagnosis not present

## 2020-03-10 DIAGNOSIS — Z950 Presence of cardiac pacemaker: Secondary | ICD-10-CM | POA: Diagnosis not present

## 2020-03-10 DIAGNOSIS — Z95828 Presence of other vascular implants and grafts: Secondary | ICD-10-CM | POA: Diagnosis not present

## 2020-03-10 DIAGNOSIS — L97509 Non-pressure chronic ulcer of other part of unspecified foot with unspecified severity: Secondary | ICD-10-CM | POA: Diagnosis not present

## 2020-03-10 DIAGNOSIS — M86071 Acute hematogenous osteomyelitis, right ankle and foot: Secondary | ICD-10-CM | POA: Diagnosis not present

## 2020-03-10 DIAGNOSIS — T8130XA Disruption of wound, unspecified, initial encounter: Secondary | ICD-10-CM | POA: Diagnosis not present

## 2020-03-10 DIAGNOSIS — Z20822 Contact with and (suspected) exposure to covid-19: Secondary | ICD-10-CM | POA: Diagnosis not present

## 2020-03-10 DIAGNOSIS — Z800 Family history of malignant neoplasm of digestive organs: Secondary | ICD-10-CM | POA: Diagnosis not present

## 2020-03-10 LAB — SARS CORONAVIRUS 2 (TAT 6-24 HRS): SARS Coronavirus 2: NEGATIVE

## 2020-03-10 NOTE — Progress Notes (Addendum)
Anesthesia Chart Review: Alexander [name]   Case: [id number] Date/Time: 03/11/20 1305   Procedure: RIGHT BELOW KNEE AMPUTATION (Right Knee)   Anesthesia type: Choice   Pre-op diagnosis: dehiscence right foot wound   Location: [facility] OR ROOM 05 / [place] OR   Surgeons: Newt Minion, MD      DISCUSSION: Patient is a 59 year old male scheduled for the above procedure. Patient with known PVD and DM2 with BLE bypasses. He has a non-healing right foot wound. He is s/p extensive debridement of lateral right foot involving skin, subcutaneous tissue, fascia, and the third metatarsal and third toe on 02/19/20, but unfortunately wound has progressed. Seen by Dr. Scot Dock on 03/09/20 and by [name] on 03/10/20. Right BKA recommended.    History includes former smoker (quit 10/07/14), HTN, DM2, anemia, HLD, CKD, cardiac syncope/sinus node dysfunction (rapid afib followed by 8 second pause 10/07/14, s/p dual chamber Medtronic MRI compatible PPM 10/08/14), CAD (s/p DES LAD, CX, medical therapy for > 80% small RCA 11/09/14), PVD (left CFA-TP trunk bypass 10/27/13; left TMA 11/04/13; right popliteal atherectomy/angioplasty 11/08/15; angioplasty right popliteal and right SFA 10/10/16; right SFA-pop bypass and 4th & 5th toe ray amputations 09/10/17 with early thrombosis s/p thrombectomy and redo bypass 09/12/17; angioplasty right popliteal, TP trunk, PT artery 11/11/19; excision right 5th toe 01/26/20), L2-5 laminectomy (06/13/12). Daily alcohol (1 beer) by 10/2019 cardiology notes.   Nephrologist is Dr. Justin Mend. Last office note from 01/08/19 received. Cr 2.1 then. Since 11/11/19, Creatinine in [facility] has ranged from 2.60-3.01 and HGB has been < 10.0 except on ISTAT 02/19/20 with HGB 11.9 prior to right foot debridement in the OR. Post-operative HGB were in the 7 range. HGB 7.0 on 03/07/20 which was noted by [name] who wrote, "we will need to type and cross him for 2 units of packed red blood cells."  Last cardiology evaluation at [facility] in 10/2019 with 6 month follow-up recommended. He has a Medtronic PPM.  Patient states he is on Eliquis at this time and not Plavix--this was also noted by [name].   03/10/20 presurgical COVID-19 test is in process. He is a same day work-up, so he will get labs and anesthesia team evaluation on the day of surgery.   VS:   BP Readings from Last 3 Encounters:  03/09/20 (!) 91/50  03/07/20 (!) 109/49  02/22/20 (!) 101/50   Pulse Readings from Last 3 Encounters:  03/09/20 75  03/07/20 66  02/22/20 62    PROVIDERS: Tisovec, [name], MD is PCP  - [name], MD is vascular surgeon - Adrian Prows, MD is cardiologist. Last evaluation on 10/23/19 by Jeri Lager, NP.  No recent episodes of A. fib by pacemaker transmissions. 21 beats of NSVT in August, but no recurrence. No chest pain. Chronic DOE. Six month follow-up planned. Edrick Oh, MD is nephrologist. Last visit 01/08/19 with Cr 2.1.    LABS: For day of surgery. See DISCUSSION. A1c 8.6% on 01/27/20.    EKG:  EKG 10/23/2019 [facility] CV): Sinus rhythm at 82 bpm with first degree AV block, left axis deviation, left anterior fascicular block. Right bundle branch block.    CV: Lexiscan myoview stress test 01/27/2016: 1. The resting electrocardiogram demonstrated normal sinus rhythm, normal resting conduction, no resting arrhythmias and normal rest repolarization. Stress EKG is non-diagnostic for ischemia as it a pharmacologic stress using Lexiscan. Stress symptoms included dyspnea. 2. The perfusion imaging study demonstrates a moderate-sized defect in the inferior wall extending  from the base towards the apex, the defect slightly improved in stress images, suggests soft tissue attenuation. However scar in this region could not excluded. Left ventricular systolic function Calculated by QGS was 72%. This is a low risk study. Clinical correlation is recommended.  Echocardiogram 03/15/2016: Left ventricle  cavity is normal in size. Severe concentric hypertrophy of the left ventricle. Normal global wall motion. Normal diastolic filling pattern. Calculated EF 66%. Left atrial cavity is mildly dilated. Right ventricle cavity is normal in size. Normal right ventricular function. Pacemaker lead/ICD lead noted in the RV. Trace mitral regurgitation. Trace pulmonic regurgitation.  Coronary angiogram/PCI 11/09/2014:  Normal large, left main.  LAD gives origin to large D1.  Just at the origin of D1, there is high-grade 90% LAD followed by long segment diffuse 70 to 80% midsegment stenosis. Large CX giving origin to large OM1. 80% distal CX. Circumflex coronary artery is codominant with RCA.  Small RCA.  Very small PDA branches arise from the distal bed.  80% proximal RCA at the crux followed by tandem high-grade 90% stenosis to 95% stenosis in the midsegment proximal to the origin of the RV branch.  It is at most a 2.5 mm vessel in the proximal end.   PCI: CSI diamond back atherectomy to Mid LAD and Distal dominant Cx and stenting 3.0x89m and 2.5x143mPromus DES. RCA long stenosis and small vessel (angio 10/07/14) left alone (medical management only). LVEF 60%.  SSS with tachy-brady syndrome S/P Medtronic advica DR (MRI compatible) dual-chamber pacemaker by Dr. KlCaryl Comesn 10/07/2014 Nuclear stress test 01/27/16:  The perfusion imaging study demonstrates a moderate size defect in the inferior wall extending from the base towards apex, the defect slightly improved and stress images, suggest soft tissue attenuation.  However scar in this region could not be excluded.  Left ventricular systolic function calculated by QGS was 72%.  This is a low risk study.  Clinical correlation recommended   Past Medical History:  Diagnosis Date  . Alcohol abuse   . Anemia   . Arthritis    "patient does not think so."  . Atrial fibrillation ([facility]  . Cardiac syncope 10/07/14   rapid A fib with 8 sec pauses on converison with  syncope- temp pacing wire placed then PPM  . Cataract    BILATERAL  . Chronic kidney disease   . Coronary artery disease   . Diabetes mellitus    dx---been  awhile  . Encounter for care of pacemaker 10/08/2014  . History of blood transfusion   . Hyperlipemia   . Hypertension   . Peripheral vascular disease ([facility]  . Presence of permanent cardiac pacemaker 10/08/2014  . Sinus node dysfunction (HC[place]aconia11/27/2015    Past Surgical History:  Procedure Laterality Date  . ABDOMINAL AORTOGRAM W/LOWER EXTREMITY N/A 09/09/2017   Procedure: ABDOMINAL AORTOGRAM W/LOWER EXTREMITY;  Surgeon: DiAngelia MouldMD;  [place] [place];  Service: Cardiovascular;  Laterality: N/A;  . ABDOMINAL AORTOGRAM W/LOWER EXTREMITY Right 11/11/2019   Procedure: ABDOMINAL AORTOGRAM W/LOWER EXTREMITY;  Surgeon: ClMarty HeckMD;  [facility];  Service: Cardiovascular;  Laterality: Right;  . AMPUTATION Left 08/19/2013   Procedure: AMPUTATION RAY;  Surgeon: JoAlta CorningMD;  [place] [facility] [place] Service: Orthopedics;  Laterality: Left;  ray amputation left 5th  . AMPUTATION Left 10/27/2013   Procedure: AMPUTATION DIGIT-LEFT 4TH TOE, 4th and 5th metatarsal.;  Surgeon: ChAngelia MouldMD;  [place] [facility] Service: Vascular;  Laterality: Left;  . AMPUTATION Left 11/04/2013   Procedure: LEFT FOOT TRANS-METATARSAL AMPUTATION WITH WOUND CLOSURE ;  Surgeon: Alta Corning, MD;  Location: [place];  Service: Orthopedics;  Laterality: Left;  . AMPUTATION Right 09/10/2017   Procedure: RIGHT FOURTH AND FIFTH TOE AMPUTATION;  Surgeon: Angelia Mould, MD;  Location: [place];  Service: Vascular;  Laterality: Right;  . AMPUTATION Right 02/19/2020   Procedure: AMPUTATION RIGHT 3RD TOE;  Surgeon: Angelia Mould, MD;  Location: [place];  Service: Vascular;  Laterality: Right;  . APPLICATION OF WOUND VAC Right 02/19/2020   Procedure: Application Of Wound Vac Right foot;  Surgeon:  Angelia Mould, MD;  Location: [place];  Service: Vascular;  Laterality: Right;  . CARDIAC CATHETERIZATION  10/07/2014   Procedure: LEFT HEART CATH AND CORONARY ANGIOGRAPHY;  Surgeon: Laverda Page, MD;  Location: [facility] CATH LAB;  Service: Cardiovascular;;  . COLONOSCOPY W/ POLYPECTOMY    . EMBOLECTOMY Right 09/12/2017   Procedure: Thrombectomy  & Redo Right Below Knee Popliteal Artery Bypass Graft  ;  Surgeon: Waynetta Sandy, MD;  Location: [place];  Service: Vascular;  Laterality: Right;  . EYE SURGERY Bilateral    cataract  . FEMORAL-TIBIAL BYPASS GRAFT Left 10/27/2013   Procedure: BYPASS GRAFT LEFT FEMORAL- POSTERIOR TIBIAL ARTERY;  Surgeon: Angelia Mould, MD;  Location: [place];  Service: Vascular;  Laterality: Left;  . FEMORAL-TIBIAL BYPASS GRAFT Right 09/10/2017   Procedure: RIGHT SUPERFICIAL  FEMORAL ARTERY-BELOW KNEE POPLITEAL ARTERY BYPASS GRAFT WITH VEIN;  Surgeon: Angelia Mould, MD;  Location: [place];  Service: Vascular;  Laterality: Right;  . I & D EXTREMITY Left 10/27/2013   Procedure: IRRIGATION AND DEBRIDEMENT EXTREMITY- LEFT FOOT;  Surgeon: Angelia Mould, MD;  Location: [place];  Service: Vascular;  Laterality: Left;  . I & D EXTREMITY Right 01/26/2020   Procedure: IRRIGATION AND DEBRIDEMENT EXTREMITY RIGHT FOOT WOUND;  Surgeon: Angelia Mould, MD;  Location: [place];  Service: Vascular;  Laterality: Right;  . INTRAOPERATIVE ARTERIOGRAM Right 09/10/2017   Procedure: INTRA OPERATIVE ARTERIOGRAM;  Surgeon: Angelia Mould, MD;  Location: [place];  Service: Vascular;  Laterality: Right;  . IR ANGIOGRAM FOLLOW UP STUDY  09/09/2017  . LEFT HEART CATHETERIZATION WITH CORONARY ANGIOGRAM N/A 11/09/2014   Procedure: LEFT HEART CATHETERIZATION WITH CORONARY ANGIOGRAM;  Surgeon: Laverda Page, MD;  Location: [facility] CATH LAB;  Service: Cardiovascular;  Laterality: N/A;  . LOWER EXTREMITY ANGIOGRAM Right 11/08/2015   Procedure: Lower Extremity  Angiogram;  Surgeon: Serafina Mitchell, MD;  Location: [place] CV LAB;  Service: Cardiovascular;  Laterality: Right;  . LUMBAR LAMINECTOMY  06/13/2012   Procedure: MICRODISCECTOMY LUMBAR LAMINECTOMY;  Surgeon: Jessy Oto, MD;  Location: [place];  Service: Orthopedics;  Laterality: N/A;  Central laminectomy L2-3, L3-4, L4-5  . PACEMAKER INSERTION  10/08/14   MDT Advisa MRI compatible dual chamber pacemaker implanted by Dr Caryl Comes for syncope with post-termination pauses  . PERCUTANEOUS CORONARY STENT INTERVENTION (PCI-S)  11/09/2014   des to lad & distal circumflex         Dr  [name]  . PERIPHERAL VASCULAR BALLOON ANGIOPLASTY Right 11/11/2019   Procedure: PERIPHERAL VASCULAR BALLOON ANGIOPLASTY;  Surgeon: Marty Heck, MD;  Location: [place] CV LAB;  Service: Cardiovascular;  Laterality: Right;  below knee popliteal, tibioperoneal trunk, posterior tibial arteries  . PERIPHERAL VASCULAR CATHETERIZATION N/A 11/08/2015   Procedure: Abdominal Aortogram;  Surgeon: Serafina Mitchell, MD;  Location: [facility]  INVASIVE CV LAB;  Service: Cardiovascular;  Laterality: N/A;  . PERIPHERAL VASCULAR CATHETERIZATION Right 11/08/2015   Procedure: Peripheral Vascular Atherectomy;  Surgeon: Serafina Mitchell, MD;  Location: [place] CV LAB;  Service: Cardiovascular;  Laterality: Right;  . PERIPHERAL VASCULAR CATHETERIZATION N/A 10/10/2016   Procedure: Lower Extremity Angiography;  Surgeon: Waynetta Sandy, MD;  Location: [place] CV LAB;  Service: Cardiovascular;  Laterality: N/A;  . PERIPHERAL VASCULAR CATHETERIZATION Right 10/10/2016   Procedure: Peripheral Vascular Atherectomy;  Surgeon: Waynetta Sandy, MD;  Location: [place] CV LAB;  Service: Cardiovascular;  Laterality: Right;  Popliteal  . PERMANENT PACEMAKER INSERTION N/A 10/08/2014   Procedure: PERMANENT PACEMAKER INSERTION;  Surgeon: Deboraha Sprang, MD;  Location: [facility] CATH LAB;  Service: Cardiovascular;  Laterality: N/A;  .  TEMPORARY PACEMAKER INSERTION Bilateral 10/07/2014   Procedure: TEMPORARY PACEMAKER INSERTION;  Surgeon: Laverda Page, MD;  Location: [facility] CATH LAB;  Service: Cardiovascular;  Laterality: Bilateral;  . WOUND DEBRIDEMENT Right 02/19/2020   Procedure: DEBRIDEMENT WOUND RIGHT FOOT;  Surgeon: Angelia Mould, MD;  Location: [facility] OR;  Service: Vascular;  Laterality: Right;    MEDICATIONS: No current facility-administered medications for this encounter.   Marland Kitchen ACCU-CHEK FASTCLIX LANCETS MISC  . apixaban (ELIQUIS) 5 MG TABS tablet  . aspirin 81 MG EC tablet  . atorvastatin (LIPITOR) 20 MG tablet  . blood glucose meter kit and supplies KIT  . Blood Glucose Monitoring Suppl (ACCU-CHEK ADVANTAGE DIABETES) kit  . clopidogrel (PLAVIX) 75 MG tablet  . furosemide (LASIX) 40 MG tablet  . glucose blood (ACCU-CHEK AVIVA PLUS) test strip  . glucose blood (CHOICE DM FORA G20 TEST STRIPS) test strip  . glucose blood test strip  . hydrALAZINE (APRESOLINE) 100 MG tablet  . Insulin NPH Isophane & Regular (NOVOLIN 70/30 [place])  . lisinopril (ZESTRIL) 10 MG tablet  . metoprolol tartrate (LOPRESSOR) 100 MG tablet  . Multiple Vitamin (MULTIVITAMIN WITH MINERALS) TABS tablet  . oxyCODONE-acetaminophen (PERCOCET) 5-325 MG tablet  . prazosin (MINIPRESS) 2 MG capsule    Myra Gianotti, PA-C Surgical Short Stay/Anesthesiology [facility] Phone [phone] [facility] Phone [phone] 03/10/2020 4:57 PM

## 2020-03-10 NOTE — Progress Notes (Signed)
Office Visit Note   Patient: Alexander [name]           Date of Birth: [birth year]           MRN: [id number] Visit Date: 03/10/2020              Requested by: [name], MD [address] [place],  [place] [id number] PCP: [name] [name], MD  Chief Complaint  Patient presents with  . Right Foot - Open Wound    eval for BKA      HPI: Patient is a 59 year old gentleman who was seen for initial evaluation and consultation from vascular vein surgery.  Patient has had revascularization surgery to the right lower extremity and multiple surgical interventions for limb salvage.  Patient states that despite multiple attempts of foot salvage he has had persistent gangrenous changes odor and drainage.  Patient states he is on Eliquis at this time he states he does not take Plavix.  Assessment & Plan: Visit Diagnoses:  1. Dehiscence of amputation stump ([place])   2. PVD (peripheral vascular disease) ([place])   3. Diabetic polyneuropathy associated with type 2 diabetes mellitus ([place])   4. Gangrene of right foot ([place])     Plan: Discussed with the patient that there are not any further foot salvage intervention options.  I recommended proceeding with a transtibial amputation discussed postoperative care including discharge with home health therapy versus inpatient rehab versus outpatient rehab.  Patient states he would like to proceed with surgical intervention at this time as soon as possible we will set him up for surgery tomorrow.  Type and cross for 2 units of packed red blood cells.  Follow-Up Instructions: Return in about 1 week (around 03/17/2020).   Ortho Exam  Patient is alert, oriented, no adenopathy, well-dressed, normal affect, normal respiratory effort. Examination patient has progressive gangrenous changes to the right foot.  There is an ischemic ulcer dorsally over the ankle the third fourth and fifth ray amputation wound has necrotic edges with odor drainage and nonviable  tissue.  There is no ascending cellulitis he does have venous swelling.  Most recent hemoglobin A1c is 8.6 and most recent albumin is 2.9.  Patient's most recent hemoglobin is 7.0 and we will need to type and cross him for 2 units of packed red blood cells.  Imaging: No results found. No images are attached to the encounter.  Labs: Lab Results  Component Value Date   HGBA1C 8.6 (H) 01/27/2020   HGBA1C 6.5 (H) 09/10/2017   HGBA1C 7.80 02/07/2015   REPTSTATUS 02/01/2020 FINAL 01/26/2020   GRAMSTAIN  01/26/2020    RARE WBC PRESENT,BOTH PMN AND MONONUCLEAR FEW GRAM POSITIVE COCCI IN PAIRS    CULT  01/26/2020    FEW DIPHTHEROIDS(CORYNEBACTERIUM SPECIES) FEW STREPTOCOCCUS MITIS/ORALIS Standardized susceptibility testing for this organism is not available. FOR DIPHTHEROIDS  (CORYNBACTERIUM SPECIES) NO ANAEROBES ISOLATED Performed at [facility], [place] [address], [place],  [address]    LABORGA STREPTOCOCCUS MITIS/ORALIS 01/26/2020     Lab Results  Component Value Date   ALBUMIN 2.9 (L) 02/15/2020   ALBUMIN 2.9 (L) 01/26/2020   ALBUMIN 3.1 (L) 09/09/2017    Lab Results  Component Value Date   MG 2.0 10/07/2014   MG 1.9 05/01/2014   MG 2.1 10/21/2013   No results found for: VD25OH  No results found for: PREALBUMIN CBC EXTENDED Latest Ref Rng & Units 03/07/2020 02/22/2020 02/20/2020  WBC 4.0 - 10.5 K/uL - - 6.8  RBC 4.22 - 5.81 MIL/uL - - 2.79(L)  HGB 13.0 - 17.0 g/dL 7.0(L) 7.7(L) 7.8(L)  HCT 39.0 - 52.0 % - - 25.0(L)  PLT 150 - 400 K/uL - - 246  NEUTROABS 1.7 - 7.7 K/uL - - -  LYMPHSABS 0.7 - 4.0 K/uL - - -     Body mass index is 35.94 kg/m.  Orders:  No orders of the defined types were placed in this encounter.  No orders of the defined types were placed in this encounter.    Procedures: No procedures performed  Clinical Data: No additional findings.  ROS:  All other systems negative, except as noted in the HPI. Review of  Systems  Objective: Vital Signs: Ht 6' (1.829 m)   Wt 265 lb (120.2 kg)   BMI 35.94 kg/m   Specialty Comments:  No specialty comments available.  PMFS History: Patient Active Problem List   Diagnosis Date Noted  . Diabetic foot ulcer associated with type 2 diabetes mellitus ([place]) 02/19/2020  . Heel ulcer ([place]) 11/05/2019  . Atherosclerotic PVD with ulceration ([place]) 09/09/2017  . Essential hypertension, benign 02/07/2015  . Renal insufficiency 02/07/2015  . Post PTCA 11/09/2014  . S/P PTCA (percutaneous transluminal coronary angioplasty) 11/09/2014  . Pacemaker: [place] DR MRI [id number] Dual chamber pacemaker 10/08/2014 10/08/2014  . Encounter for care of pacemaker 10/08/2014  . Sinus node dysfunction ([place]) 10/08/2014  . Cardiac asystole ([place]) 10/07/2014  . Cardiac syncope 10/07/2014  . Diabetes mellitus with stage 3 chronic kidney disease ([place]) 10/07/2014  . CAD (coronary artery disease), native coronary artery 10/07/2014  . Diabetes mellitus due to underlying condition without complications ([place]) 08/65/7846  . Anemia, unspecified 05/25/2014  . Syncope 04/30/2014  . Atherosclerosis of native arteries of the extremities with gangrene ([place]) 12/16/2013  . Volume overload 10/30/2013  . Acute renal failure ([place]) 10/28/2013  . PAD (peripheral artery disease) ([place]) 10/27/2013  . Leukocytosis 10/21/2013  . Anemia 10/21/2013  . Osteomyelitis of left foot ([place]) 08/19/2013  . Spinal stenosis, lumbar region, with neurogenic claudication 06/12/2012    Class: Diagnosis of  . DIABETES MELLITUS, TYPE II 03/15/2009  . ALCOHOL ABUSE 03/15/2009  . TOBACCO USE 03/15/2009  . HYPERTENSION 03/15/2009   Past Medical History:  Diagnosis Date  . Alcohol abuse   . Anemia   . Arthritis    "patient does not think so."  . Atrial fibrillation ([place])   . Cardiac syncope 10/07/14   rapid A fib with 8 sec pauses on converison with syncope- temp pacing wire placed then PPM  . Cataract     BILATERAL  . Chronic kidney disease   . Coronary artery disease   . Diabetes mellitus    dx---been  awhile  . Encounter for care of pacemaker 10/08/2014  . History of blood transfusion   . Hyperlipemia   . Hypertension   . Peripheral vascular disease ([place])   . Presence of permanent cardiac pacemaker 10/08/2014  . Sinus node dysfunction ([place]) 10/08/2014    Family History  Problem Relation Age of Onset  . Diabetes type II Mother   . Hypertension Mother   . Diabetes Mother   . Liver cancer Father   . Diabetes type II Sister   . Breast cancer Sister   . Diabetes Sister   . Hypertension Sister   . Diabetes type II Brother   . Kidney failure Brother   . Diabetes Brother   . Hypertension Brother   . Diabetes type II Sister  Past Surgical History:  Procedure Laterality Date  . ABDOMINAL AORTOGRAM W/LOWER EXTREMITY N/A 09/09/2017   Procedure: ABDOMINAL AORTOGRAM W/LOWER EXTREMITY;  Surgeon: Angelia Mould, MD;  Location: [place];  Service: Cardiovascular;  Laterality: N/A;  . ABDOMINAL AORTOGRAM W/LOWER EXTREMITY Right 11/11/2019   Procedure: ABDOMINAL AORTOGRAM W/LOWER EXTREMITY;  Surgeon: Marty Heck, MD;  Location: [facility];  Service: Cardiovascular;  Laterality: Right;  . AMPUTATION Left 08/19/2013   Procedure: AMPUTATION RAY;  Surgeon: Alta Corning, MD;  Location: [facility];  Service: Orthopedics;  Laterality: Left;  ray amputation left 5th  . AMPUTATION Left 10/27/2013   Procedure: AMPUTATION DIGIT-LEFT 4TH TOE, 4th and 5th metatarsal.;  Surgeon: Angelia Mould, MD;  Location: [facility];  Service: Vascular;  Laterality: Left;  . AMPUTATION Left 11/04/2013   Procedure: LEFT FOOT TRANS-METATARSAL AMPUTATION WITH WOUND CLOSURE ;  Surgeon: Alta Corning, MD;  Location: [facility];  Service: Orthopedics;  Laterality: Left;  . AMPUTATION Right 09/10/2017   Procedure: RIGHT FOURTH AND FIFTH TOE AMPUTATION;  Surgeon: Angelia Mould, MD;   Location: [facility];  Service: Vascular;  Laterality: Right;  . AMPUTATION Right 02/19/2020   Procedure: AMPUTATION RIGHT 3RD TOE;  Surgeon: Angelia Mould, MD;  Location: [facility];  Service: Vascular;  Laterality: Right;  . APPLICATION OF WOUND VAC Right 02/19/2020   Procedure: Application Of Wound Vac Right foot;  Surgeon: Angelia Mould, MD;  Location: [facility];  Service: Vascular;  Laterality: Right;  . CARDIAC CATHETERIZATION  10/07/2014   Procedure: LEFT HEART CATH AND CORONARY ANGIOGRAPHY;  Surgeon: Laverda Page, MD;  Location: [facility] C[facility] LAB;  Service: Cardiovascular;;  . COLONOSCOPY W/ POLYPECTOMY    . EMBOLECTOMY Right 09/12/2017   Procedure: Thrombectomy  & Redo Right Below Knee Popliteal Artery Bypass Graft  ;  Surgeon: Waynetta Sandy, MD;  Location: [place];  Service: Vascular;  Laterality: Right;  . EYE SURGERY Bilateral    cataract  . FEMORAL-TIBIAL BYPASS GRAFT Left 10/27/2013   Procedure: BYPASS GRAFT LEFT FEMORAL- POSTERIOR TIBIAL ARTERY;  Surgeon: Angelia Mould, MD;  Location: [place];  Service: Vascular;  Laterality: Left;  . FEMORAL-TIBIAL BYPASS GRAFT Right 09/10/2017   Procedure: RIGHT SUPERFICIAL  FEMORAL ARTERY-BELOW KNEE POPLITEAL ARTERY BYPASS GRAFT WITH VEIN;  Surgeon: Angelia Mould, MD;  Location: [place];  Service: Vascular;  Laterality: Right;  . I & D EXTREMITY Left 10/27/2013   Procedure: IRRIGATION AND DEBRIDEMENT EXTREMITY- LEFT FOOT;  Surgeon: Angelia Mould, MD;  Location: [facility];  Service: Vascular;  Laterality: Left;  . I & D EXTREMITY Right 01/26/2020   Procedure: IRRIGATION AND DEBRIDEMENT EXTREMITY RIGHT FOOT WOUND;  Surgeon: Angelia Mould, MD;  Location: [place];  Service: Vascular;  Laterality: Right;  . INTRAOPERATIVE ARTERIOGRAM Right 09/10/2017   Procedure: INTRA OPERATIVE ARTERIOGRAM;  Surgeon: Angelia Mould, MD;  Location: [facility];  Service: Vascular;  Laterality: Right;  . IR ANGIOGRAM FOLLOW UP STUDY   09/09/2017  . LEFT HEART CATHETERIZATION WITH CORONARY ANGIOGRAM N/A 11/09/2014   Procedure: LEFT HEART CATHETERIZATION WITH CORONARY ANGIOGRAM;  Surgeon: Laverda Page, MD;  Location: [facility] CATH LAB;  Service: Cardiovascular;  Laterality: N/A;  . LOWER EXTREMITY ANGIOGRAM Right 11/08/2015   Procedure: Lower Extremity Angiogram;  Surgeon: Serafina Mitchell, MD;  Location: [facility] [place] [facility];  Service: Cardiovascular;  Laterality: Right;  . LUMBAR LAMINECTOMY  06/13/2012   Procedure: MICRODISCECTOMY LUMBAR LAMINECTOMY;  Surgeon: Jeneen Rinks  Howell Pringle, MD;  Location: [place];  Service: Orthopedics;  Laterality: N/A;  Central laminectomy L2-3, L3-4, L4-5  . PACEMAKER INSERTION  10/08/14   MDT [employer] MRI compatible dual chamber pacemaker implanted by Dr Caryl Comes for syncope with post-termination pauses  . PERCUTANEOUS CORONARY STENT INTERVENTION (PCI-S)  11/09/2014   des to lad & distal circumflex         Dr  [name]  . PERIPHERAL VASCULAR BALLOON ANGIOPLASTY Right 11/11/2019   Procedure: PERIPHERAL VASCULAR BALLOON ANGIOPLASTY;  Surgeon: Marty Heck, MD;  Location: [place] [facility];  Service: Cardiovascular;  Laterality: Right;  below knee popliteal, tibioperoneal trunk, posterior tibial arteries  . PERIPHERAL VASCULAR CATHETERIZATION N/A 11/08/2015   Procedure: Abdominal Aortogram;  Surgeon: Serafina Mitchell, MD;  Location: [place] [facility];  Service: Cardiovascular;  Laterality: N/A;  . PERIPHERAL VASCULAR CATHETERIZATION Right 11/08/2015   Procedure: Peripheral Vascular Atherectomy;  Surgeon: Serafina Mitchell, MD;  Location: [place] [facility];  Service: Cardiovascular;  Laterality: Right;  . PERIPHERAL VASCULAR CATHETERIZATION N/A 10/10/2016   Procedure: Lower Extremity Angiography;  Surgeon: Waynetta Sandy, MD;  Location: [place] [facility];  Service: Cardiovascular;  Laterality: N/A;  . PERIPHERAL VASCULAR CATHETERIZATION Right 10/10/2016   Procedure: Peripheral Vascular Atherectomy;   Surgeon: Waynetta Sandy, MD;  Location: [place] [facility];  Service: Cardiovascular;  Laterality: Right;  Popliteal  . PERMANENT PACEMAKER INSERTION N/A 10/08/2014   Procedure: PERMANENT PACEMAKER INSERTION;  Surgeon: Deboraha Sprang, MD;  Location[facility];  Service: Cardiovascular;  Laterality: N/A;  . TEMPORARY PACEMAKER INSERTION Bilateral 10/07/2014   Procedure: TEMPORARY PACEMAKER INSERTION;  Surgeon: Laverda Page, MD;  Location: [facility] LAB;  Service: Cardiovascular;  Laterality: Bilateral;  . WOUND DEBRIDEMENT Right 02/19/2020   Procedure: DEBRIDEMENT WOUND RIGHT FOOT;  Surgeon: Angelia Mould, MD;  Location: [facility] OR;  Service: Vascular;  Laterality: Right;   Social History   Occupational History  . Occupation: Disabled  Tobacco Use  . Smoking status: Former Smoker    Packs/day: 1.50    Years: 30.00    Pack years: 45.00    Types: Cigars    Quit date: 10/07/2014    Years since quitting: 5.4  . Smokeless tobacco: Never Used  Substance and Sexual Activity  . Alcohol use: Not Currently    Alcohol/week: 6.0 standard drinks    Types: 6 Cans of beer per week  . Drug use: No  . Sexual activity: Not on file

## 2020-03-10 NOTE — Progress Notes (Signed)
Pt denies SOB and chest pain. Pt stated that he is under the care of Dr. [name], Cardiology an Dr. [name], PCP. Pt satted that last dose of Eliquis was 03/09/20 as instructed by MD. Pt made aware to stop takingvitamins, fish oil and herbal medications. Do not take any NSAIDs ie: Ibuprofen, Advil, Naproxen (Aleve), Motrin, BC and Goody Powder. Pt made aware to take 32 units of Novolin insulin 70/30 at HS and no insulin DOS ( type II ). Pt made aware to check CBG every 2 hours prior to arrival to hospital on DOS. Pt made aware to treat a CBG < 70 with 4 ounces of apple or cranberry juice, wait 15 minutes after intervention to recheck BG, if BG remains < 70, call Short Stay unit to speak with a nurse. Pt reminded to quarantine. Pt verbalized understanding of all pre-op instructions. Peri-op prescription for pacemaker emailed; awaiting response. [employer] Rep emailed per protocol. PA, Anesthesiology, asked to review pt history; see note.

## 2020-03-10 NOTE — Progress Notes (Signed)
Per Bevely Palmer Persons, PA,  okay for pt to arrive 2.5 hours earlier on DOS " all of the blood does not need to be transfused, just as long as it is started when going into the OR. "  PA stated that she will reach out to pt to make them aware of the need to transfuse ( Hemoglobin 7.0 ).

## 2020-03-10 NOTE — Anesthesia Preprocedure Evaluation (Addendum)
Anesthesia Evaluation  Patient identified by MRN, date of birth, ID band Patient awake    Reviewed: Allergy & Precautions, NPO status , Patient's Chart, lab work & pertinent test results  History of Anesthesia Complications Negative for: history of anesthetic complications  Airway Mallampati: II  TM Distance: >3 FB Neck ROM: Full    Dental  (+) Dental Advisory Given, Poor Dentition, Missing, Chipped   Pulmonary former smoker,    Pulmonary exam normal        Cardiovascular hypertension, Pt. on medications + CAD, + Cardiac Stents and + Peripheral Vascular Disease  Normal cardiovascular exam+ dysrhythmias Atrial Fibrillation + pacemaker      Neuro/Psych negative neurological ROS  negative psych ROS   GI/Hepatic negative GI ROS, (+)     substance abuse  alcohol use,   Endo/Other  diabetes, Insulin Dependent Obesity   Renal/GU CRFRenal disease     Musculoskeletal  (+) Arthritis ,   Abdominal   Peds  Hematology  (+) anemia ,  On eliquis    Anesthesia Other Findings Covid neg 4/29  Reproductive/Obstetrics                            Anesthesia Physical Anesthesia Plan  ASA: IV  Anesthesia Plan: General   Post-op Pain Management:  Regional for Post-op pain   Induction: Intravenous  PONV Risk Score and Plan: 2 and Treatment may vary due to age or medical condition, Ondansetron, Dexamethasone and Midazolam  Airway Management Planned: LMA  Additional Equipment: None  Intra-op Plan:   Post-operative Plan: Extubation in OR  Informed Consent: I have reviewed the patients History and Physical, chart, labs and discussed the procedure including the risks, benefits and alternatives for the proposed anesthesia with the patient or authorized representative who has indicated his/her understanding and acceptance.     Dental advisory given  Plan Discussed with: CRNA and  Anesthesiologist  Anesthesia Plan Comments:       Anesthesia Quick Evaluation

## 2020-03-11 ENCOUNTER — Inpatient Hospital Stay (HOSPITAL_COMMUNITY): Payer: Medicare Other | Admitting: Physician Assistant

## 2020-03-11 ENCOUNTER — Inpatient Hospital Stay (HOSPITAL_COMMUNITY)
Admission: RE | Admit: 2020-03-11 | Discharge: 2020-03-17 | DRG: 240 | Disposition: A | Discharge status: Home Health | Payer: Medicare Other | Attending: Orthopedic Surgery | Admitting: Orthopedic Surgery

## 2020-03-11 ENCOUNTER — Encounter (HOSPITAL_COMMUNITY): Payer: Self-pay | Admitting: Orthopedic Surgery

## 2020-03-11 ENCOUNTER — Encounter (HOSPITAL_COMMUNITY)
Admission: RE | Disposition: A | Discharge status: Home / Self Care | Payer: Self-pay | Source: Home / Self Care | Attending: Orthopedic Surgery

## 2020-03-11 ENCOUNTER — Other Ambulatory Visit: Payer: Self-pay

## 2020-03-11 DIAGNOSIS — Z20822 Contact with and (suspected) exposure to covid-19: Secondary | ICD-10-CM | POA: Diagnosis present

## 2020-03-11 DIAGNOSIS — I10 Essential (primary) hypertension: Secondary | ICD-10-CM | POA: Diagnosis present

## 2020-03-11 DIAGNOSIS — Z950 Presence of cardiac pacemaker: Secondary | ICD-10-CM

## 2020-03-11 DIAGNOSIS — E1169 Type 2 diabetes mellitus with other specified complication: Secondary | ICD-10-CM | POA: Diagnosis present

## 2020-03-11 DIAGNOSIS — T8130XA Disruption of wound, unspecified, initial encounter: Secondary | ICD-10-CM | POA: Diagnosis present

## 2020-03-11 DIAGNOSIS — M86071 Acute hematogenous osteomyelitis, right ankle and foot: Secondary | ICD-10-CM | POA: Diagnosis present

## 2020-03-11 DIAGNOSIS — T8781 Dehiscence of amputation stump: Secondary | ICD-10-CM | POA: Diagnosis present

## 2020-03-11 DIAGNOSIS — Z7289 Other problems related to lifestyle: Secondary | ICD-10-CM

## 2020-03-11 DIAGNOSIS — D62 Acute posthemorrhagic anemia: Secondary | ICD-10-CM | POA: Diagnosis not present

## 2020-03-11 DIAGNOSIS — Z881 Allergy status to other antibiotic agents status: Secondary | ICD-10-CM

## 2020-03-11 DIAGNOSIS — E1152 Type 2 diabetes mellitus with diabetic peripheral angiopathy with gangrene: Principal | ICD-10-CM | POA: Diagnosis present

## 2020-03-11 DIAGNOSIS — Z841 Family history of disorders of kidney and ureter: Secondary | ICD-10-CM | POA: Diagnosis not present

## 2020-03-11 DIAGNOSIS — Z882 Allergy status to sulfonamides status: Secondary | ICD-10-CM | POA: Diagnosis not present

## 2020-03-11 DIAGNOSIS — F1910 Other psychoactive substance abuse, uncomplicated: Secondary | ICD-10-CM | POA: Diagnosis present

## 2020-03-11 DIAGNOSIS — E1142 Type 2 diabetes mellitus with diabetic polyneuropathy: Secondary | ICD-10-CM | POA: Diagnosis present

## 2020-03-11 DIAGNOSIS — Z89421 Acquired absence of other right toe(s): Secondary | ICD-10-CM | POA: Diagnosis not present

## 2020-03-11 DIAGNOSIS — Z87891 Personal history of nicotine dependence: Secondary | ICD-10-CM

## 2020-03-11 DIAGNOSIS — Z79899 Other long term (current) drug therapy: Secondary | ICD-10-CM

## 2020-03-11 DIAGNOSIS — Z89422 Acquired absence of other left toe(s): Secondary | ICD-10-CM

## 2020-03-11 DIAGNOSIS — Z95828 Presence of other vascular implants and grafts: Secondary | ICD-10-CM

## 2020-03-11 DIAGNOSIS — Z955 Presence of coronary angioplasty implant and graft: Secondary | ICD-10-CM

## 2020-03-11 DIAGNOSIS — Z888 Allergy status to other drugs, medicaments and biological substances status: Secondary | ICD-10-CM | POA: Diagnosis not present

## 2020-03-11 DIAGNOSIS — Z8249 Family history of ischemic heart disease and other diseases of the circulatory system: Secondary | ICD-10-CM | POA: Diagnosis not present

## 2020-03-11 DIAGNOSIS — Z800 Family history of malignant neoplasm of digestive organs: Secondary | ICD-10-CM | POA: Diagnosis not present

## 2020-03-11 DIAGNOSIS — Z833 Family history of diabetes mellitus: Secondary | ICD-10-CM | POA: Diagnosis not present

## 2020-03-11 DIAGNOSIS — E669 Obesity, unspecified: Secondary | ICD-10-CM | POA: Diagnosis present

## 2020-03-11 DIAGNOSIS — Z794 Long term (current) use of insulin: Secondary | ICD-10-CM

## 2020-03-11 DIAGNOSIS — M1990 Unspecified osteoarthritis, unspecified site: Secondary | ICD-10-CM | POA: Diagnosis present

## 2020-03-11 DIAGNOSIS — I2510 Atherosclerotic heart disease of native coronary artery without angina pectoris: Secondary | ICD-10-CM | POA: Diagnosis present

## 2020-03-11 DIAGNOSIS — Y835 Amputation of limb(s) as the cause of abnormal reaction of the patient, or of later complication, without mention of misadventure at the time of the procedure: Secondary | ICD-10-CM | POA: Diagnosis present

## 2020-03-11 DIAGNOSIS — I482 Chronic atrial fibrillation, unspecified: Secondary | ICD-10-CM | POA: Diagnosis present

## 2020-03-11 DIAGNOSIS — I96 Gangrene, not elsewhere classified: Secondary | ICD-10-CM | POA: Diagnosis not present

## 2020-03-11 DIAGNOSIS — Z6836 Body mass index (BMI) 36.0-36.9, adult: Secondary | ICD-10-CM

## 2020-03-11 DIAGNOSIS — Z7982 Long term (current) use of aspirin: Secondary | ICD-10-CM

## 2020-03-11 DIAGNOSIS — Z7901 Long term (current) use of anticoagulants: Secondary | ICD-10-CM

## 2020-03-11 DIAGNOSIS — E785 Hyperlipidemia, unspecified: Secondary | ICD-10-CM | POA: Diagnosis present

## 2020-03-11 DIAGNOSIS — D638 Anemia in other chronic diseases classified elsewhere: Secondary | ICD-10-CM | POA: Diagnosis present

## 2020-03-11 HISTORY — DX: Osteomyelitis, unspecified: M86.9

## 2020-03-11 HISTORY — PX: AMPUTATION: SHX166

## 2020-03-11 LAB — GLUCOSE, CAPILLARY
Glucose-Capillary: 301 mg/dL — ABNORMAL HIGH (ref 70–99)
Glucose-Capillary: 291 mg/dL — ABNORMAL HIGH (ref 70–99)
Glucose-Capillary: 310 mg/dL — ABNORMAL HIGH (ref 70–99)
Glucose-Capillary: 261 mg/dL — ABNORMAL HIGH (ref 70–99)
Glucose-Capillary: 359 mg/dL — ABNORMAL HIGH (ref 70–99)

## 2020-03-11 LAB — PREPARE RBC (CROSSMATCH)

## 2020-03-11 LAB — CBC
HCT: 22 % — ABNORMAL LOW (ref 39.0–52.0)
Hemoglobin: 6.6 g/dL — CL (ref 13.0–17.0)
MCH: 27.2 pg (ref 26.0–34.0)
MCHC: 30 g/dL (ref 30.0–36.0)
MCV: 90.5 fL (ref 80.0–100.0)
Platelets: 350 10*3/uL (ref 150–400)
RBC: 2.43 MIL/uL — ABNORMAL LOW (ref 4.22–5.81)
RDW: 14.6 % (ref 11.5–15.5)
WBC: 9.3 10*3/uL (ref 4.0–10.5)
nRBC: 0 % (ref 0.0–0.2)

## 2020-03-11 LAB — PROTIME-INR
INR: 1.7 — ABNORMAL HIGH (ref 0.8–1.2)
Prothrombin Time: 19.5 seconds — ABNORMAL HIGH (ref 11.4–15.2)

## 2020-03-11 LAB — BASIC METABOLIC PANEL
Anion gap: 11 (ref 5–15)
BUN: 78 mg/dL — ABNORMAL HIGH (ref 8–23)
CO2: 19 mmol/L — ABNORMAL LOW (ref 22–32)
Calcium: 8.6 mg/dL — ABNORMAL LOW (ref 8.9–10.3)
Chloride: 103 mmol/L (ref 98–111)
Creatinine, Ser: 2.51 mg/dL — ABNORMAL HIGH (ref 0.61–1.24)
GFR calc Af Amer: 31 mL/min — ABNORMAL LOW (ref >60)
GFR calc non Af Amer: 27 mL/min — ABNORMAL LOW (ref >60)
Glucose, Bld: 317 mg/dL — ABNORMAL HIGH (ref 70–99)
Potassium: 4.7 mmol/L (ref 3.5–5.1)
Sodium: 133 mmol/L — ABNORMAL LOW (ref 135–145)

## 2020-03-11 SURGERY — AMPUTATION BELOW KNEE
Anesthesia: General | Site: Knee | Laterality: Right

## 2020-03-11 MED ORDER — INSULIN ASPART 100 UNIT/ML ~~LOC~~ SOLN
0.0000 [IU] | Freq: Three times a day (TID) | SUBCUTANEOUS | Status: DC
  Administered 2020-03-12: 15 [IU] via SUBCUTANEOUS
  Administered 2020-03-12: 11 [IU] via SUBCUTANEOUS
  Administered 2020-03-12: 15 [IU] via SUBCUTANEOUS
  Administered 2020-03-13 (×2): 3 [IU] via SUBCUTANEOUS
  Administered 2020-03-13 – 2020-03-14 (×3): 5 [IU] via SUBCUTANEOUS
  Administered 2020-03-14 – 2020-03-15 (×3): 3 [IU] via SUBCUTANEOUS
  Administered 2020-03-15: 5 [IU] via SUBCUTANEOUS
  Administered 2020-03-16: 2 [IU] via SUBCUTANEOUS
  Administered 2020-03-16: 3 [IU] via SUBCUTANEOUS
  Administered 2020-03-16: 5 [IU] via SUBCUTANEOUS
  Administered 2020-03-17 (×2): 3 [IU] via SUBCUTANEOUS

## 2020-03-11 MED ORDER — METOPROLOL TARTRATE 50 MG PO TABS
100.0000 mg | ORAL_TABLET | Freq: Two times a day (BID) | ORAL | Status: DC
  Administered 2020-03-11 – 2020-03-17 (×11): 100 mg via ORAL
  Filled 2020-03-11 (×12): qty 2

## 2020-03-11 MED ORDER — APIXABAN 5 MG PO TABS
5.0000 mg | ORAL_TABLET | Freq: Two times a day (BID) | ORAL | Status: DC
  Administered 2020-03-12 – 2020-03-17 (×10): 5 mg via ORAL
  Filled 2020-03-11 (×11): qty 1

## 2020-03-11 MED ORDER — INSULIN NPH (HUMAN) (ISOPHANE) 100 UNIT/ML ~~LOC~~ SUSP
35.0000 [IU] | Freq: Every day | SUBCUTANEOUS | Status: DC
  Administered 2020-03-12 – 2020-03-17 (×6): 35 [IU] via SUBCUTANEOUS
  Filled 2020-03-11: qty 10

## 2020-03-11 MED ORDER — METOCLOPRAMIDE HCL 5 MG/ML IJ SOLN: 5.0000 mg | Freq: Three times a day (TID) | INTRAMUSCULAR | Status: DC | PRN

## 2020-03-11 MED ORDER — MIDAZOLAM HCL 2 MG/2ML IJ SOLN
INTRAMUSCULAR | Status: AC
  Administered 2020-03-11: 2 mg via INTRAVENOUS
  Filled 2020-03-11: qty 2

## 2020-03-11 MED ORDER — PROPOFOL 10 MG/ML IV BOLUS
INTRAVENOUS | Status: DC | PRN
  Administered 2020-03-11: 120 mg via INTRAVENOUS

## 2020-03-11 MED ORDER — FENTANYL CITRATE (PF) 250 MCG/5ML IJ SOLN
INTRAMUSCULAR | Status: AC
  Filled 2020-03-11: qty 5

## 2020-03-11 MED ORDER — INSULIN NPH (HUMAN) (ISOPHANE) 100 UNIT/ML ~~LOC~~ SUSP
30.0000 [IU] | Freq: Every day | SUBCUTANEOUS | Status: DC
  Administered 2020-03-11 – 2020-03-16 (×6): 30 [IU] via SUBCUTANEOUS

## 2020-03-11 MED ORDER — DEXTROSE 5 % IV SOLN
3.0000 g | INTRAVENOUS | Status: AC
  Administered 2020-03-11: 3 g via INTRAVENOUS
  Filled 2020-03-11: qty 3

## 2020-03-11 MED ORDER — EPHEDRINE SULFATE-NACL 50-0.9 MG/10ML-% IV SOSY
PREFILLED_SYRINGE | INTRAVENOUS | Status: DC | PRN
  Administered 2020-03-11: 10 mg via INTRAVENOUS

## 2020-03-11 MED ORDER — CEFAZOLIN SODIUM-DEXTROSE 2-4 GM/100ML-% IV SOLN
2.0000 g | Freq: Four times a day (QID) | INTRAVENOUS | Status: AC
  Administered 2020-03-11 – 2020-03-12 (×3): 2 g via INTRAVENOUS
  Filled 2020-03-11 (×4): qty 100

## 2020-03-11 MED ORDER — FENTANYL CITRATE (PF) 100 MCG/2ML IJ SOLN: 25.0000 ug | INTRAMUSCULAR | Status: DC | PRN

## 2020-03-11 MED ORDER — ROPIVACAINE HCL 5 MG/ML IJ SOLN
INTRAMUSCULAR | Status: DC | PRN
Start: 2020-03-11 — End: 2020-03-11
  Administered 2020-03-11: 20 mL via EPIDURAL
  Administered 2020-03-11: 25 mL via EPIDURAL

## 2020-03-11 MED ORDER — INSULIN ASPART 100 UNIT/ML ~~LOC~~ SOLN
10.0000 [IU] | Freq: Once | SUBCUTANEOUS | Status: AC
  Administered 2020-03-11: 12:00:00 10 [IU] via SUBCUTANEOUS

## 2020-03-11 MED ORDER — SODIUM CHLORIDE 0.9 % IV SOLN
INTRAVENOUS | Status: DC | PRN
Start: 2020-03-11 — End: 2020-03-11

## 2020-03-11 MED ORDER — METHOCARBAMOL 500 MG PO TABS
500.0000 mg | ORAL_TABLET | Freq: Four times a day (QID) | ORAL | Status: DC | PRN
  Administered 2020-03-13 – 2020-03-14 (×3): 500 mg via ORAL
  Filled 2020-03-11 (×4): qty 1

## 2020-03-11 MED ORDER — FENTANYL CITRATE (PF) 100 MCG/2ML IJ SOLN
INTRAMUSCULAR | Status: AC
  Administered 2020-03-11: 50 ug via INTRAVENOUS
  Filled 2020-03-11: qty 2

## 2020-03-11 MED ORDER — SODIUM CHLORIDE 0.9 % IV SOLN: INTRAVENOUS | Status: DC

## 2020-03-11 MED ORDER — FENTANYL CITRATE (PF) 100 MCG/2ML IJ SOLN: 50.0000 ug | Freq: Once | INTRAMUSCULAR | Status: AC

## 2020-03-11 MED ORDER — LISINOPRIL 10 MG PO TABS
10.0000 mg | ORAL_TABLET | Freq: Every day | ORAL | Status: DC
  Administered 2020-03-13 – 2020-03-17 (×4): 10 mg via ORAL
  Filled 2020-03-11 (×6): qty 1

## 2020-03-11 MED ORDER — OXYCODONE HCL 5 MG PO TABS
5.0000 mg | ORAL_TABLET | ORAL | Status: DC | PRN
  Administered 2020-03-12: 5 mg via ORAL
  Administered 2020-03-12 – 2020-03-14 (×6): 10 mg via ORAL
  Administered 2020-03-15 – 2020-03-16 (×3): 5 mg via ORAL
  Filled 2020-03-11 (×2): qty 1
  Filled 2020-03-11 (×4): qty 2
  Filled 2020-03-11 (×2): qty 1
  Filled 2020-03-11 (×2): qty 2

## 2020-03-11 MED ORDER — ACETAMINOPHEN 325 MG PO TABS: 325.0000 mg | ORAL_TABLET | Freq: Four times a day (QID) | ORAL | Status: DC | PRN

## 2020-03-11 MED ORDER — ONDANSETRON HCL 4 MG/2ML IJ SOLN
INTRAMUSCULAR | Status: AC
Start: 2020-03-11 — End: ?
  Filled 2020-03-11: qty 2

## 2020-03-11 MED ORDER — OXYCODONE HCL 5 MG PO TABS: 5.0000 mg | ORAL_TABLET | Freq: Once | ORAL | Status: DC | PRN

## 2020-03-11 MED ORDER — DEXAMETHASONE SODIUM PHOSPHATE 10 MG/ML IJ SOLN
INTRAMUSCULAR | Status: AC
  Filled 2020-03-11: qty 1

## 2020-03-11 MED ORDER — INSULIN ASPART 100 UNIT/ML ~~LOC~~ SOLN
10.0000 [IU] | Freq: Once | SUBCUTANEOUS | Status: AC
  Administered 2020-03-11: 10 [IU] via SUBCUTANEOUS

## 2020-03-11 MED ORDER — ONDANSETRON HCL 4 MG/2ML IJ SOLN: 4.0000 mg | Freq: Four times a day (QID) | INTRAMUSCULAR | Status: DC | PRN

## 2020-03-11 MED ORDER — 0.9 % SODIUM CHLORIDE (POUR BTL) OPTIME
TOPICAL | Status: DC | PRN
  Administered 2020-03-11: 13:00:00 1000 mL

## 2020-03-11 MED ORDER — ONDANSETRON HCL 4 MG PO TABS: 4.0000 mg | ORAL_TABLET | Freq: Four times a day (QID) | ORAL | Status: DC | PRN

## 2020-03-11 MED ORDER — LIDOCAINE 2% (20 MG/ML) 5 ML SYRINGE
INTRAMUSCULAR | Status: DC | PRN
  Administered 2020-03-11: 100 mg via INTRAVENOUS

## 2020-03-11 MED ORDER — METHOCARBAMOL 1000 MG/10ML IJ SOLN
500.0000 mg | Freq: Four times a day (QID) | INTRAVENOUS | Status: DC | PRN
  Filled 2020-03-11: qty 5

## 2020-03-11 MED ORDER — FUROSEMIDE 40 MG PO TABS
40.0000 mg | ORAL_TABLET | Freq: Two times a day (BID) | ORAL | Status: DC
  Administered 2020-03-12 – 2020-03-17 (×11): 40 mg via ORAL
  Filled 2020-03-11 (×11): qty 1

## 2020-03-11 MED ORDER — OXYCODONE HCL 5 MG/5ML PO SOLN: 5.0000 mg | Freq: Once | ORAL | Status: DC | PRN

## 2020-03-11 MED ORDER — ONDANSETRON HCL 4 MG/2ML IJ SOLN: 4.0000 mg | Freq: Once | INTRAMUSCULAR | Status: DC | PRN

## 2020-03-11 MED ORDER — ONDANSETRON HCL 4 MG/2ML IJ SOLN
INTRAMUSCULAR | Status: DC | PRN
  Administered 2020-03-11: 4 mg via INTRAVENOUS

## 2020-03-11 MED ORDER — LACTATED RINGERS IV SOLN: INTRAVENOUS | Status: DC

## 2020-03-11 MED ORDER — ATORVASTATIN CALCIUM 10 MG PO TABS
20.0000 mg | ORAL_TABLET | Freq: Every day | ORAL | Status: DC
  Administered 2020-03-12 – 2020-03-17 (×6): 20 mg via ORAL
  Filled 2020-03-11 (×6): qty 2

## 2020-03-11 MED ORDER — PRAZOSIN HCL 2 MG PO CAPS
2.0000 mg | ORAL_CAPSULE | Freq: Every day | ORAL | Status: DC
  Administered 2020-03-11 – 2020-03-16 (×6): 2 mg via ORAL
  Filled 2020-03-11 (×7): qty 1

## 2020-03-11 MED ORDER — MIDAZOLAM HCL 2 MG/2ML IJ SOLN
INTRAMUSCULAR | Status: AC
  Filled 2020-03-11: qty 2

## 2020-03-11 MED ORDER — HYDRALAZINE HCL 50 MG PO TABS
100.0000 mg | ORAL_TABLET | Freq: Three times a day (TID) | ORAL | Status: DC
  Administered 2020-03-11 – 2020-03-16 (×5): 100 mg via ORAL
  Filled 2020-03-11 (×11): qty 2

## 2020-03-11 MED ORDER — MIDAZOLAM HCL 2 MG/2ML IJ SOLN: 2.0000 mg | Freq: Once | INTRAMUSCULAR | Status: AC

## 2020-03-11 MED ORDER — INSULIN NPH ISOPHANE & REGULAR (70-30) 100 UNIT/ML ~~LOC~~ SUSP: 30.0000 [IU] | SUBCUTANEOUS | Status: DC

## 2020-03-11 MED ORDER — DOCUSATE SODIUM 100 MG PO CAPS
100.0000 mg | ORAL_CAPSULE | Freq: Two times a day (BID) | ORAL | Status: DC
  Administered 2020-03-11 – 2020-03-17 (×12): 100 mg via ORAL
  Filled 2020-03-11 (×12): qty 1

## 2020-03-11 MED ORDER — METOCLOPRAMIDE HCL 5 MG PO TABS: 5.0000 mg | ORAL_TABLET | Freq: Three times a day (TID) | ORAL | Status: DC | PRN

## 2020-03-11 MED ORDER — SODIUM CHLORIDE 0.9% IV SOLUTION: Freq: Once | INTRAVENOUS | Status: AC

## 2020-03-11 MED ORDER — HYDROMORPHONE HCL 1 MG/ML IJ SOLN: 0.5000 mg | INTRAMUSCULAR | Status: DC | PRN

## 2020-03-11 SURGICAL SUPPLY — 37 items
BLADE SAW RECIP 87.9 MT (BLADE) ×2 IMPLANT
BLADE SURG 21 STRL SS (BLADE) ×2 IMPLANT
BNDG COHESIVE 6X5 TAN STRL LF (GAUZE/BANDAGES/DRESSINGS) IMPLANT
CANISTER WOUND CARE 500ML ATS (WOUND CARE) ×2 IMPLANT
COVER SURGICAL LIGHT HANDLE (MISCELLANEOUS) ×2 IMPLANT
COVER WAND RF STERILE (DRAPES) IMPLANT
CUFF TOURN SGL QUICK 34 (TOURNIQUET CUFF) ×2
CUFF TRNQT CYL 34X4.125X (TOURNIQUET CUFF) ×1 IMPLANT
DRAPE INCISE IOBAN 66X45 STRL (DRAPES) ×2 IMPLANT
DRAPE U-SHAPE 47X51 STRL (DRAPES) ×2 IMPLANT
DRESSING PREVENA PLUS CUSTOM (GAUZE/BANDAGES/DRESSINGS) ×1 IMPLANT
DRSG PREVENA PLUS CUSTOM (GAUZE/BANDAGES/DRESSINGS) ×2
DURAPREP 26ML APPLICATOR (WOUND CARE) ×2 IMPLANT
ELECT REM PT RETURN 9FT ADLT (ELECTROSURGICAL) ×2
ELECTRODE REM PT RTRN 9FT ADLT (ELECTROSURGICAL) ×1 IMPLANT
GLOVE BIOGEL PI IND STRL 9 (GLOVE) ×1 IMPLANT
GLOVE BIOGEL PI INDICATOR 9 (GLOVE) ×1
GLOVE SURG ORTHO 9.0 STRL STRW (GLOVE) ×2 IMPLANT
GOWN STRL REUS W/ TWL XL LVL3 (GOWN DISPOSABLE) ×2 IMPLANT
GOWN STRL REUS W/TWL XL LVL3 (GOWN DISPOSABLE) ×4
KIT BASIN OR (CUSTOM PROCEDURE TRAY) ×2 IMPLANT
KIT TURNOVER KIT B (KITS) ×2 IMPLANT
MANIFOLD NEPTUNE II (INSTRUMENTS) ×2 IMPLANT
NS IRRIG 1000ML POUR BTL (IV SOLUTION) ×2 IMPLANT
PACK ORTHO EXTREMITY (CUSTOM PROCEDURE TRAY) ×2 IMPLANT
PAD ARMBOARD 7.5X6 YLW CONV (MISCELLANEOUS) ×2 IMPLANT
PREVENA RESTOR ARTHOFORM 46X30 (CANNISTER) ×2 IMPLANT
SPONGE LAP 18X18 RF (DISPOSABLE) IMPLANT
STAPLER VISISTAT 35W (STAPLE) ×2 IMPLANT
STOCKINETTE IMPERVIOUS LG (DRAPES) ×2 IMPLANT
SUT ETHILON 2 0 PSLX (SUTURE) ×4 IMPLANT
SUT SILK 2 0 (SUTURE) ×2
SUT SILK 2-0 18XBRD TIE 12 (SUTURE) ×1 IMPLANT
SUT VIC AB 1 CTX 27 (SUTURE) ×4 IMPLANT
TOWEL GREEN STERILE (TOWEL DISPOSABLE) ×2 IMPLANT
TUBE CONNECTING 12X1/4 (SUCTIONS) ×2 IMPLANT
YANKAUER SUCT BULB TIP NO VENT (SUCTIONS) ×2 IMPLANT

## 2020-03-11 NOTE — Op Note (Signed)
   Date of Surgery: 03/11/2020  INDICATIONS: Mr. Gaza is a 59 y.o.-year-old male who has undergone foot salvage intervention that has broken down with exposed bone necrotic tissue from the forefoot to the hindfoot.  He does not have any further foot salvage intervention options and presents at this time for transtibial amputation.Marland Kitchen  PREOPERATIVE DIAGNOSIS: Gangrene osteomyelitis right foot  POSTOPERATIVE DIAGNOSIS: Same.  PROCEDURE: Transtibial amputation Application of Prevena wound VAC  SURGEON: [name], M.D.  ANESTHESIA:  general  IV FLUIDS AND URINE: See anesthesia.  ESTIMATED BLOOD LOSS: 100 mL.  COMPLICATIONS: None.  DESCRIPTION OF PROCEDURE: The patient was brought to the operating room and underwent a general anesthetic. After adequate levels of anesthesia were obtained patient's lower extremity was prepped using DuraPrep draped into a sterile field. A timeout was called. The foot was draped out of the sterile field with impervious stockinette. A transverse incision was made 11 cm distal to the tibial tubercle. This curved proximally and a large posterior flap was created. The tibia was transected 1 cm proximal to the skin incision. The fibula was transected just proximal to the tibial incision. The tibia was beveled anteriorly. A large posterior flap was created. The sciatic nerve was pulled cut and allowed to retract. The vascular bundles were suture ligated with 2-0 silk. The deep and superficial fascial layers were closed using #1 Vicryl. The skin was closed using staples and 2-0 nylon. The wound was covered with a Prevena wound VAC. There was a good suction fit. A prosthetic shrinker was applied. Patient was extubated taken to the PACU in stable condition.   DISCHARGE PLANNING:  Antibiotic duration: 24 hours  Weightbearing: Nonweightbearing on the right  Pain medication: Opioid pathway  Dressing care/ Wound VAC: Continue wound VAC for 1 week at discharge  Discharge to:  Skilled nursing versus rehab versus home health pending on recommendations from physical therapy  Follow-up: In the office 1 week post operative.  Meridee Score, MD [place] 2:46 PM

## 2020-03-11 NOTE — H&P (Signed)
Alexander [name] is an 59 y.o. male.   Chief Complaint: Right Foot osteomyelitis HPI: Patient is a 59 year old gentleman who was seen for initial evaluation and consultation from vascular vein surgery.  Patient has had revascularization surgery to the right lower extremity and multiple surgical interventions for limb salvage.  Patient states that despite multiple attempts of foot salvage he has had persistent gangrenous changes odor and drainage.   Past Medical History:  Diagnosis Date  . Alcohol abuse   . Anemia   . Arthritis    "patient does not think so."  . Atrial fibrillation ([place])   . Cardiac syncope 10/07/14   rapid A fib with 8 sec pauses on converison with syncope- temp pacing wire placed then PPM  . Cataract    BILATERAL  . Chronic kidney disease   . Coronary artery disease   . Diabetes mellitus    dx---been  awhile  . Encounter for care of pacemaker 10/08/2014  . History of blood transfusion   . Hyperlipemia   . Hypertension   . Osteomyelitis ([place])    right foot  . Peripheral vascular disease ([place])   . Presence of permanent cardiac pacemaker 10/08/2014  . Sinus node dysfunction ([place]) 10/08/2014    Past Surgical History:  Procedure Laterality Date  . ABDOMINAL AORTOGRAM W/LOWER EXTREMITY N/A 09/09/2017   Procedure: ABDOMINAL AORTOGRAM W/LOWER EXTREMITY;  Surgeon: Angelia Mould, MD;  Location: [facility];  Service: Cardiovascular;  Laterality: N/A;  . ABDOMINAL AORTOGRAM W/LOWER EXTREMITY Right 11/11/2019   Procedure: ABDOMINAL AORTOGRAM W/LOWER EXTREMITY;  Surgeon: Marty Heck, MD;  Location: [facility];  Service: Cardiovascular;  Laterality: Right;  . AMPUTATION Left 08/19/2013   Procedure: AMPUTATION RAY;  Surgeon: Alta Corning, MD;  Location: [facility];  Service: Orthopedics;  Laterality: Left;  ray amputation left 5th  . AMPUTATION Left 10/27/2013   Procedure: AMPUTATION DIGIT-LEFT 4TH TOE, 4th and 5th metatarsal.;  Surgeon:  Angelia Mould, MD;  Location: [facility];  Service: Vascular;  Laterality: Left;  . AMPUTATION Left 11/04/2013   Procedure: LEFT FOOT TRANS-METATARSAL AMPUTATION WITH WOUND CLOSURE ;  Surgeon: Alta Corning, MD;  Location: [facility];  Service: Orthopedics;  Laterality: Left;  . AMPUTATION Right 09/10/2017   Procedure: RIGHT FOURTH AND FIFTH TOE AMPUTATION;  Surgeon: Angelia Mould, MD;  Location: [facility];  Service: Vascular;  Laterality: Right;  . AMPUTATION Right 02/19/2020   Procedure: AMPUTATION RIGHT 3RD TOE;  Surgeon: Angelia Mould, MD;  Location: [facility];  Service: Vascular;  Laterality: Right;  . APPLICATION OF WOUND VAC Right 02/19/2020   Procedure: Application Of Wound Vac Right foot;  Surgeon: Angelia Mould, MD;  Location: [facility];  Service: Vascular;  Laterality: Right;  . CARDIAC CATHETERIZATION  10/07/2014   Procedure: LEFT HEART CATH AND CORONARY ANGIOGRAPHY;  Surgeon: Laverda Page, MD;  Location: [facility] CATH LAB;  Service: Cardiovascular;;  . COLONOSCOPY W/ POLYPECTOMY    . EMBOLECTOMY Right 09/12/2017   Procedure: Thrombectomy  & Redo Right Below Knee Popliteal Artery Bypass Graft  ;  Surgeon: Waynetta Sandy, MD;  Location: [facility];  Service: Vascular;  Laterality: Right;  . EYE SURGERY Bilateral    cataract  . FEMORAL-TIBIAL BYPASS GRAFT Left 10/27/2013   Procedure: BYPASS GRAFT LEFT FEMORAL- POSTERIOR TIBIAL ARTERY;  Surgeon: Angelia Mould, MD;  Location: [facility];  Service: Vascular;  Laterality: Left;  . FEMORAL-TIBIAL BYPASS GRAFT Right 09/10/2017   Procedure: RIGHT  SUPERFICIAL  FEMORAL ARTERY-BELOW KNEE POPLITEAL ARTERY BYPASS GRAFT WITH VEIN;  Surgeon: Angelia Mould, MD;  Location: [place];  Service: Vascular;  Laterality: Right;  . I & D EXTREMITY Left 10/27/2013   Procedure: IRRIGATION AND DEBRIDEMENT EXTREMITY- LEFT FOOT;  Surgeon: Angelia Mould, MD;  Location: [facility];  Service: Vascular;  Laterality: Left;  . I & D EXTREMITY  Right 01/26/2020   Procedure: IRRIGATION AND DEBRIDEMENT EXTREMITY RIGHT FOOT WOUND;  Surgeon: Angelia Mould, MD;  Location: [facility];  Service: Vascular;  Laterality: Right;  . INTRAOPERATIVE ARTERIOGRAM Right 09/10/2017   Procedure: INTRA OPERATIVE ARTERIOGRAM;  Surgeon: Angelia Mould, MD;  Location: [facility];  Service: Vascular;  Laterality: Right;  . IR ANGIOGRAM FOLLOW UP STUDY  09/09/2017  . LEFT HEART CATHETERIZATION WITH CORONARY ANGIOGRAM N/A 11/09/2014   Procedure: LEFT HEART CATHETERIZATION WITH CORONARY ANGIOGRAM;  Surgeon: Laverda Page, MD;  [facility];  Service: Cardiovascular;  Laterality: N/A;  . LOWER EXTREMITY ANGIOGRAM Right 11/08/2015   Procedure: Lower Extremity Angiogram;  Surgeon: Serafina Mitchell, MD;  Location: [facility];  Service: Cardiovascular;  Laterality: Right;  . LUMBAR LAMINECTOMY  06/13/2012   Procedure: MICRODISCECTOMY LUMBAR LAMINECTOMY;  Surgeon: Jessy Oto, MD;  Location: [facility];  Service: Orthopedics;  Laterality: N/A;  Central laminectomy L2-3, L3-4, L4-5  . PACEMAKER INSERTION  10/08/14   MDT Advisa MRI compatible dual chamber pacemaker implanted by Dr Caryl Comes for syncope with post-termination pauses  . PERCUTANEOUS CORONARY STENT INTERVENTION (PCI-S)  11/09/2014   des to lad & distal circumflex         Dr  [name]  . PERIPHERAL VASCULAR BALLOON ANGIOPLASTY Right 11/11/2019   Procedure: PERIPHERAL VASCULAR BALLOON ANGIOPLASTY;  Surgeon: Marty Heck, MD;  Location: [facility];  Service: Cardiovascular;  Laterality: Right;  below knee popliteal, tibioperoneal trunk, posterior tibial arteries  . PERIPHERAL VASCULAR CATHETERIZATION N/A 11/08/2015   Procedure: Abdominal Aortogram;  Surgeon: Serafina Mitchell, MD;  Location: [facility];  Service: Cardiovascular;  Laterality: N/A;  . PERIPHERAL VASCULAR CATHETERIZATION Right 11/08/2015   Procedure: Peripheral Vascular Atherectomy;  Surgeon: Serafina Mitchell,  MD;  Location: [facility];  Service: Cardiovascular;  Laterality: Right;  . PERIPHERAL VASCULAR CATHETERIZATION N/A 10/10/2016   Procedure: Lower Extremity Angiography;  Surgeon: Waynetta Sandy, MD;  Location: [facility];  Service: Cardiovascular;  Laterality: N/A;  . PERIPHERAL VASCULAR CATHETERIZATION Right 10/10/2016   Procedure: Peripheral Vascular Atherectomy;  Surgeon: Waynetta Sandy, MD;  Location: [facility];  Service: Cardiovascular;  Laterality: Right;  Popliteal  . PERMANENT PACEMAKER INSERTION N/A 10/08/2014   Procedure: PERMANENT PACEMAKER INSERTION;  Surgeon: Deboraha Sprang, MD;  Location: [facility] CATH LAB;  Service: Cardiovascular;  Laterality: N/A;  . TEMPORARY PACEMAKER INSERTION Bilateral 10/07/2014   Procedure: TEMPORARY PACEMAKER INSERTION;  Surgeon: Laverda Page, MD;  Location: [facility] CATH LAB;  Service: Cardiovascular;  Laterality: Bilateral;  . WOUND DEBRIDEMENT Right 02/19/2020   Procedure: DEBRIDEMENT WOUND RIGHT FOOT;  Surgeon: Angelia Mould, MD;  Location: [facility];  Service: Vascular;  Laterality: Right;    Family History  Problem Relation Age of Onset  . Diabetes type II Mother   . Hypertension Mother   . Diabetes Mother   . Liver cancer Father   . Diabetes type II Sister   . Breast cancer Sister   . Diabetes Sister   . Hypertension Sister   .  Diabetes type II Brother   . Kidney failure Brother   . Diabetes Brother   . Hypertension Brother   . Diabetes type II Sister    Social History:  reports that he quit smoking about 5 years ago. His smoking use included cigars. He has a 45.00 pack-year smoking history. He has never used smokeless tobacco. He reports current alcohol use of about 6.0 standard drinks of alcohol per week. He reports that he does not use drugs.  Allergies:  Allergies  Allergen Reactions  . Keflex [Cephalexin] Nausea And Vomiting  . Pletal [Cilostazol] Palpitations    "Can hear heart beating  loudly".  . Sulfa Antibiotics Rash    No medications prior to admission.    Results for orders placed or performed during the hospital encounter of 03/10/20 (from the past 48 hour(s))  SARS CORONAVIRUS 2 (TAT 6-24 HRS) Nasopharyngeal Nasopharyngeal Swab     Status: None   Collection Time: 03/10/20  2:59 PM   Specimen: Nasopharyngeal Swab  Result Value Ref Range   SARS Coronavirus 2 NEGATIVE NEGATIVE    Comment: (NOTE) SARS-CoV-2 target nucleic acids are NOT DETECTED. The SARS-CoV-2 RNA is generally detectable in upper and lower respiratory specimens during the acute phase of infection. Negative results do not preclude SARS-CoV-2 infection, do not rule out co-infections with other pathogens, and should not be used as the sole basis for treatment or other patient management decisions. Negative results must be combined with clinical observations, patient history, and epidemiological information. The expected result is Negative. Fact Sheet for Patients: SugarRoll.be Fact Sheet for Healthcare Providers: https://www.woods-mathews.com/ This test is not yet approved or cleared by the [place] FDA and  has been authorized for detection and/or diagnosis of SARS-CoV-2 by FDA under an Emergency Use Authorization (EUA). This EUA will remain  in effect (meaning this test can be used) for the duration of the COVID-19 declaration under Section 56 4(b)(1) of the Act, 21 U.S.C. section 360bbb-3(b)(1), unless the authorization is terminated or revoked sooner. Performed at [facility], [place] [address], [place], [place] [address]    No results found.  Review of Systems  All other systems reviewed and are negative.   There were no vitals taken for this visit. Physical Exam  Patient is alert, oriented, no adenopathy, well-dressed, normal affect, normal respiratory effort. Examination patient has progressive gangrenous changes to the right  foot.  There is an ischemic ulcer dorsally over the ankle the third fourth and fifth ray amputation wound has necrotic edges with odor drainage and nonviable tissue.  There is no ascending cellulitis he does have venous swelling.  Most recent hemoglobin A1c is 8.6 and most recent albumin is 2.9. Heart RRR Lungs Clear Patient's most recent hemoglobin is 7.0 and we will need to type and cross him for 2 units of packed red blood cells. Assessment/Plan 1. Dehiscence of amputation stump ([place])   2. PVD (peripheral vascular disease) ([place])   3. Diabetic polyneuropathy associated with type 2 diabetes mellitus ([place])   4. Gangrene of right foot ([place])     Plan: Discussed with the patient that there are not any further foot salvage intervention options.  I recommended proceeding with a transtibial amputation discussed postoperative care including discharge with home health therapy versus inpatient rehab versus outpatient rehab.  Patient states he would like to proceed with surgical intervention at this time as soon as possible we will set him up for surgery tomorrow.   Bevely Palmer Chrishawn Kring, [place] 03/11/2020,  7:04 AM

## 2020-03-11 NOTE — Anesthesia Procedure Notes (Signed)
Anesthesia Regional Block: Popliteal block   Pre-Anesthetic Checklist: ,, timeout performed, Correct Patient, Correct Site, Correct Laterality, Correct Procedure, Correct Position, site marked, Risks and benefits discussed,  Surgical consent,  Pre-op evaluation,  At surgeon's request and post-op pain management  Laterality: Right  Prep: chloraprep       Needles:  Injection technique: Single-shot  Needle Type: Echogenic Needle     Needle Length: 10cm  Needle Gauge: 21     Additional Needles:   Narrative:  Start time: 03/11/2020 1:50 PM End time: 03/11/2020 1:53 PM Injection made incrementally with aspirations every 5 mL.  Performed by: Personally  Anesthesiologist: Audry Pili, MD  Additional Notes: No pain on injection. No increased resistance to injection. Injection made in 5cc increments. Good needle visualization. Patient tolerated the procedure well.

## 2020-03-11 NOTE — Transfer of Care (Addendum)
Immediate Anesthesia Transfer of Care Note  Patient: Alexander [name]  Procedure(s) Performed: RIGHT BELOW KNEE AMPUTATION (Right Knee)  Patient Location: PACU  Anesthesia Type:GA combined with regional for post-op pain  Level of Consciousness: awake, alert  and oriented  Airway & Oxygen Therapy: Patient Spontanous Breathing  Post-op Assessment: Report given to RN and Post -op Vital signs reviewed and stable  Post vital signs: Reviewed and stable  Last Vitals:  Vitals Value Taken Time  BP 126/58 03/11/20 1450  Temp    Pulse 66 03/11/20 1449  Resp 14 03/11/20 1451  SpO2 98 % 03/11/20 1449  Vitals shown include unvalidated device data.  Last Pain:  Vitals:   03/11/20 1327  TempSrc: Oral  PainSc:       Patients Stated Pain Goal: 3 (83/77/93 9688)  Complications: No apparent anesthesia complications

## 2020-03-11 NOTE — Anesthesia Procedure Notes (Signed)
Anesthesia Regional Block: Adductor canal block   Pre-Anesthetic Checklist: ,, timeout performed, Correct Patient, Correct Site, Correct Laterality, Correct Procedure, Correct Position, site marked, Risks and benefits discussed,  Surgical consent,  Pre-op evaluation,  At surgeon's request and post-op pain management  Laterality: Right  Prep: chloraprep       Needles:  Injection technique: Single-shot  Needle Type: Echogenic Needle     Needle Length: 10cm  Needle Gauge: 21     Additional Needles:   Narrative:  Start time: 03/11/2020 1:46 PM End time: 03/11/2020 1:49 PM Injection made incrementally with aspirations every 5 mL.  Performed by: Personally  Anesthesiologist: Audry Pili, MD  Additional Notes: No pain on injection. No increased resistance to injection. Injection made in 5cc increments. Good needle visualization. Patient tolerated the procedure well.

## 2020-03-11 NOTE — Anesthesia Procedure Notes (Signed)
Procedure Name: LMA Insertion Date/Time: 03/11/2020 2:05 PM Performed by: Imagene Riches, CRNA Pre-anesthesia Checklist: Patient identified, Emergency Drugs available, Suction available and Patient being monitored Patient Re-evaluated:Patient Re-evaluated prior to induction Oxygen Delivery Method: Circle System Utilized Preoxygenation: Pre-oxygenation with 100% oxygen Induction Type: IV induction Ventilation: Mask ventilation without difficulty LMA: LMA inserted LMA Size: 4.0 Number of attempts: 1 Airway Equipment and Method: Bite block Placement Confirmation: positive ETCO2 Tube secured with: Tape Dental Injury: Teeth and Oropharynx as per pre-operative assessment

## 2020-03-11 NOTE — Progress Notes (Signed)
CRITICAL VALUE ALERT  Critical Value:  Hgb 6.6  Date & Time Notified:  03/11/20 1215  Provider Notified: T.Brock MD  Orders Received/Actions taken: proceed with blood transfusion

## 2020-03-12 LAB — CBC
HCT: 23.5 % — ABNORMAL LOW (ref 39.0–52.0)
Hemoglobin: 7.6 g/dL — ABNORMAL LOW (ref 13.0–17.0)
MCH: 27.7 pg (ref 26.0–34.0)
MCHC: 32.3 g/dL (ref 30.0–36.0)
MCV: 85.8 fL (ref 80.0–100.0)
Platelets: 296 10*3/uL (ref 150–400)
RBC: 2.74 MIL/uL — ABNORMAL LOW (ref 4.22–5.81)
RDW: 15 % (ref 11.5–15.5)
WBC: 7.7 10*3/uL (ref 4.0–10.5)
nRBC: 0 % (ref 0.0–0.2)

## 2020-03-12 LAB — GLUCOSE, CAPILLARY
Glucose-Capillary: 364 mg/dL — ABNORMAL HIGH (ref 70–99)
Glucose-Capillary: 418 mg/dL — ABNORMAL HIGH (ref 70–99)
Glucose-Capillary: 350 mg/dL — ABNORMAL HIGH (ref 70–99)
Glucose-Capillary: 330 mg/dL — ABNORMAL HIGH (ref 70–99)

## 2020-03-12 NOTE — Discharge Instructions (Signed)

## 2020-03-12 NOTE — Progress Notes (Signed)
Patient ID: Alexander [name], male   DOB: July 20, 1961, 59 y.o.   MRN: [id number] Postoperative day 1 right transtibial amputation.  The wound VAC canister has a good suction fit.  Patient states that the wound VAC drainage canister was full and changed out however I do not see anything recorded in the chart.  Patient does have a total of 100 cc in the wound VAC canister.  Plan for discharge as per recommendations from physical therapy.

## 2020-03-12 NOTE — Evaluation (Signed)
Physical Therapy Evaluation Patient Details Name: Alexander [name] MRN: [id number] DOB: [birth year] Today's Date: 03/12/2020   History of Present Illness  Pt is a 59 y/o male s/p R transtibial amputation. PMH including but not limited to PVD, a-fib, CKD, CAD, HLD, HTN.  Clinical Impression  Pt presented supine in bed with HOB elevated, awake and willing to participate in therapy session. Prior to admission, pt reported that he ambulated with use of a RW and was independent with ADLs. Pt lives with his wife and two adult daughters in a single level home with two steps to enter. Pt stated that a ramp was being built and would be ready prior to d/c. At the time of evaluation, pt overall moving relatively well; however, limited more so due to the pain from his L wrist IV site when attempting to WB through the RW. He was able to perform bed mobility with supervision and transfer with min A. Hopeful that pt will continue to progress well with mobility. Pt would continue to benefit from skilled physical therapy services at this time while admitted and after d/c to address the below listed limitations in order to improve overall safety and independence with functional mobility.     Follow Up Recommendations Home health PT;Supervision/Assistance - 24 hour    Equipment Recommendations  3in1 (PT);Wheelchair (measurements PT);Wheelchair cushion (measurements PT);Other (comment)(w/c with amputee support attachment)    Recommendations for Other Services       Precautions / Restrictions Precautions Precautions: Fall Precaution Comments: wound VAC Restrictions Weight Bearing Restrictions: Yes RLE Weight Bearing: Non weight bearing      Mobility  Bed Mobility Overal bed mobility: Modified Independent                Transfers Overall transfer level: Needs assistance Equipment used: Rolling walker (2 wheeled) Transfers: Sit to/from Stand Sit to Stand: Min assist         General transfer  comment: pt able to stand from EOB with min A for balance; however, IV in L wrist was very painful and pt unable to attempt hops or pivot transfer  Ambulation/Gait                Stairs            Wheelchair Mobility    Modified Rankin (Stroke Patients Only)       Balance Overall balance assessment: Needs assistance Sitting-balance support: No upper extremity supported;Feet supported Sitting balance-Leahy Scale: Good     Standing balance support: Bilateral upper extremity supported Standing balance-Leahy Scale: Poor                               Pertinent Vitals/Pain Pain Assessment: Faces Faces Pain Scale: Hurts a little bit Pain Location: R residual limb Pain Descriptors / Indicators: Discomfort Pain Intervention(s): Monitored during session;Repositioned    Home Living Family/patient expects to be discharged to:: Private residence Living Arrangements: Spouse/significant other;Children Available Help at Discharge: Family;Available 24 hours/day Type of Home: House Home Access: Stairs to enter;Other (comment)(working on building a ramp before d/c) Entrance Stairs-Rails: None Entrance Stairs-Number of Steps: 2 Home Layout: One level Home Equipment: [place] - 2 wheels;Cane - single point;Bedside commode;Shower seat - built in;Grab bars - tub/shower;Grab bars - toilet      Prior Function Level of Independence: Independent with assistive device(s)         Comments: uses a RW for ambulation  Hand Dominance   Dominant Hand: Right    Extremity/Trunk Assessment   Upper Extremity Assessment Upper Extremity Assessment: Overall WFL for tasks assessed    Lower Extremity Assessment Lower Extremity Assessment: RLE deficits/detail RLE Deficits / Details: pt with wound VAC in place to distal aspect of residual limb. Decreased sensation to light touch through distal aspect. Pt currently with full AROM knee extension, flexion to ~90 degrees  actively       Communication   Communication: No difficulties  Cognition Arousal/Alertness: Awake/alert Behavior During Therapy: WFL for tasks assessed/performed Overall Cognitive Status: Within Functional Limits for tasks assessed                                        General Comments      Exercises Amputee Exercises Quad Sets: AROM;Strengthening;Right;5 reps Knee Flexion: AROM;Strengthening;Right;10 reps;Seated Knee Extension: AROM;Strengthening;Right;10 reps;Seated   Assessment/Plan    PT Assessment Patient needs continued PT services  PT Problem List Decreased strength;Decreased range of motion;Decreased balance;Decreased activity tolerance;Decreased mobility;Decreased coordination;Decreased knowledge of use of DME;Decreased safety awareness;Decreased knowledge of precautions;Pain       PT Treatment Interventions DME instruction;Gait training;Functional mobility training;Stair training;Therapeutic activities;Therapeutic exercise;Balance training;Neuromuscular re-education;Patient/family education    PT Goals (Current goals can be found in the Care Plan section)  Acute Rehab PT Goals Patient Stated Goal: return home PT Goal Formulation: With patient Time For Goal Achievement: 03/26/20 Potential to Achieve Goals: Good    Frequency Min 5X/week   Barriers to discharge        Co-evaluation               AM-PAC PT "6 Clicks" Mobility  Outcome Measure Help needed turning from your back to your side while in a flat bed without using bedrails?: None Help needed moving from lying on your back to sitting on the side of a flat bed without using bedrails?: None Help needed moving to and from a bed to a chair (including a wheelchair)?: A Little Help needed standing up from a chair using your arms (e.g., wheelchair or bedside chair)?: A Little Help needed to walk in hospital room?: A Little Help needed climbing 3-5 steps with a railing? : A Lot 6 Click  Score: 19    End of Session Equipment Utilized During Treatment: Gait belt Activity Tolerance: Patient tolerated treatment well Patient left: in bed;with call bell/phone within reach;Other (comment)(sitting EOB for a wash up with nurse tech) Nurse Communication: Mobility status PT Visit Diagnosis: Other abnormalities of gait and mobility (R26.89);Pain Pain - Right/Left: Right Pain - part of body: Leg    Time: 9030-0923 PT Time Calculation (min) (ACUTE ONLY): 30 min   Charges:   PT Evaluation $PT Eval Moderate Complexity: 1 Mod PT Treatments $Therapeutic Activity: 8-22 mins        Anastasio Champion, DPT  Acute Rehabilitation Services Pager [phone] Office [place] 03/12/2020, 3:58 PM

## 2020-03-12 NOTE — Progress Notes (Signed)
Occupational Therapy Evaluation Patient Details Name: Alexander [name] MRN: [id number] DOB: 10/21/1961 Today's Date: 03/12/2020    History of Present Illness Alexander [name] is a 59 year old gentleman who presents to [facility] due to right transtibial amputation with PMH significant of PVD, A fib, CKD, CAD, HLD, HTN   Clinical Impression   Pt presents with above diagnosis. PTA pt PLOF living at home with family, mostly Mod I with ADLs and IADLs with use of AE and increased time as needed. Pt currently limited with functional transfers and safe ADL engagement due to weakness, pain, and decreased standing stability. Pt will benefit from additional acute OT to address safety with ADLs and maximize independence prior to dc to [facility] with support.     Follow Up Recommendations  Home health OT    Equipment Recommendations  None recommended by OT    Recommendations for Other Services       Precautions / Restrictions Precautions Precautions: Fall Restrictions Weight Bearing Restrictions: Yes RLE Weight Bearing: Non weight bearing      Mobility Bed Mobility Overal bed mobility: Modified Independent             General bed mobility comments: received sitting up EOB, however, mod I entering bed.  Transfers Overall transfer level: Needs assistance Equipment used: Rolling walker (2 wheeled) Transfers: Sit to/from [facility] Sit to Stand: Min assist Stand pivot transfers: Mod assist       General transfer comment: Two attempts to stand from [facility], limited stability, pt educated to scoot towards edge of seat to improve sit to stand sequence    Balance Overall balance assessment: Needs assistance Sitting-balance support: No upper extremity supported;Feet supported Sitting balance-Leahy Scale: Normal     Standing balance support: No upper extremity supported Standing balance-Leahy Scale: Fair Standing balance comment: with L transmet amp and now R toe amp and wound vac, pt  with increased fall risk. Educated pt on this and need for use of RW at this time instead of cane                           ADL either performed or assessed with clinical judgement   ADL Overall ADL's : Needs assistance/impaired Eating/Feeding: Independent;Sitting   Grooming: Set up;Sitting   Upper Body Bathing: Set up;Supervision/ safety;Sitting Upper Body Bathing Details (indicate cue type and reason): pt reports mostly sponge bathing PTA Lower Body Bathing: Min guard;Minimal assistance;Sit to/from stand   Upper Body Dressing : Sitting;Modified independent   Lower Body Dressing: Minimal assistance;Sit to/from stand Lower Body Dressing Details (indicate cue type and reason): pt able to don L shoe via figure 4, doff sock bending  forward Toilet Transfer: RW;Moderate assistance;Stand-pivot;BSC Toilet Transfer Details (indicate cue type and reason): toilet transfer from bed to recliner, heavy VC's for hand placement and safety with sequencing. Pt limited with power up to stand mod A for safety. Toileting- Clothing Manipulation and Hygiene: Supervision/safety;Sitting/lateral lean Toileting - Clothing Manipulation Details (indicate cue type and reason): Pt demonstrated wiping while seated on BSC with shifting and leaning     Functional mobility during ADLs: Rolling walker;Moderate assistance;Cueing for safety;Cueing for sequencing       Vision         Perception     Praxis      Pertinent Vitals/Pain Pain Assessment: Faces Faces Pain Scale: Hurts a little bit Pain Location: R toes Pain Descriptors / Indicators: Discomfort;Grimacing Pain Intervention(s): Monitored during session;Premedicated before session;Repositioned  Hand Dominance Right   Extremity/Trunk Assessment Upper Extremity Assessment Upper Extremity Assessment: Overall WFL for tasks assessed   Lower Extremity Assessment Lower Extremity Assessment: Defer to PT evaluation   Cervical / Trunk  Assessment Cervical / Trunk Assessment: Normal   Communication Communication Communication: No difficulties   Cognition Arousal/Alertness: Awake/alert Behavior During Therapy: WFL for tasks assessed/performed Overall Cognitive Status: Within Functional Limits for tasks assessed                                     General Comments       Exercises     Shoulder Instructions      Home Living Family/patient expects to be discharged to:: Private residence Living Arrangements: Spouse/significant other;Children Available Help at Discharge: Family;Available 24 hours/day Type of Home: House Home Access: Stairs to enter;Other (comment)(working on building a ramp before d/c) Entrance Stairs-Number of Steps: 2 Entrance Stairs-Rails: None Home Layout: One level     Bathroom Shower/Tub: Occupational psychologist: Standard Bathroom Accessibility: Yes   Home Equipment: Environmental consultant - 2 wheels;Cane - single point;Bedside commode;Shower seat - built in;Grab bars - tub/shower;Grab bars - toilet          Prior Functioning/Environment Level of Independence: Independent with assistive device(s)        Comments: uses a RW for ambulation        OT Problem List: Decreased strength;Decreased range of motion;Decreased activity tolerance;Impaired balance (sitting and/or standing);Decreased safety awareness;Decreased knowledge of use of DME or AE;Pain      OT Treatment/Interventions: Self-care/ADL training;Therapeutic exercise;Energy conservation;DME and/or AE instruction;Therapeutic activities;Patient/family education;Balance training    OT Goals(Current goals can be found in the care plan section) Acute Rehab OT Goals Patient Stated Goal: return home OT Goal Formulation: With patient Time For Goal Achievement: 03/26/20 Potential to Achieve Goals: Good  OT Frequency: Min 2X/week   Barriers to D/C: Inaccessible home environment          Co-evaluation               AM-PAC OT "6 Clicks" Daily Activity     Outcome Measure Help from another person eating meals?: None Help from another person taking care of personal grooming?: A Little Help from another person toileting, which includes using toliet, bedpan, or urinal?: A Little Help from another person bathing (including washing, rinsing, drying)?: A Little Help from another person to put on and taking off regular upper body clothing?: None Help from another person to put on and taking off regular lower body clothing?: A Little 6 Click Score: 20   End of Session Equipment Utilized During Treatment: Rolling walker;Gait belt Nurse Communication: Mobility status  Activity Tolerance: Patient tolerated treatment well Patient left: with call bell/phone within reach;in bed;with nursing/sitter in room  OT Visit Diagnosis: Other abnormalities of gait and mobility (R26.89);Pain Pain - Right/Left: Right Pain - part of body: Leg                Time: 9833-8250 OT Time Calculation (min): 38 min Charges:  OT General Charges $OT Visit: 1 Visit OT Treatments $Self Care/Home Management : 23-37 mins  Minus Breeding, MSOT, OTR/L  Supplemental Rehabilitation Services  [phone]  Marius Ditch 03/12/2020, 11:46 AM

## 2020-03-13 LAB — CBC
HCT: 20.2 % — ABNORMAL LOW (ref 39.0–52.0)
Hemoglobin: 6.2 g/dL — CL (ref 13.0–17.0)
MCH: 27.7 pg (ref 26.0–34.0)
MCHC: 30.7 g/dL (ref 30.0–36.0)
MCV: 90.2 fL (ref 80.0–100.0)
Platelets: 311 10*3/uL (ref 150–400)
RBC: 2.24 MIL/uL — ABNORMAL LOW (ref 4.22–5.81)
RDW: 15.4 % (ref 11.5–15.5)
WBC: 8.2 10*3/uL (ref 4.0–10.5)
nRBC: 0.2 % (ref 0.0–0.2)

## 2020-03-13 LAB — PREPARE RBC (CROSSMATCH)

## 2020-03-13 LAB — HEMOGLOBIN AND HEMATOCRIT, BLOOD
HCT: 25.2 % — ABNORMAL LOW (ref 39.0–52.0)
Hemoglobin: 8.1 g/dL — ABNORMAL LOW (ref 13.0–17.0)

## 2020-03-13 LAB — GLUCOSE, CAPILLARY
Glucose-Capillary: 200 mg/dL — ABNORMAL HIGH (ref 70–99)
Glucose-Capillary: 244 mg/dL — ABNORMAL HIGH (ref 70–99)
Glucose-Capillary: 166 mg/dL — ABNORMAL HIGH (ref 70–99)
Glucose-Capillary: 232 mg/dL — ABNORMAL HIGH (ref 70–99)

## 2020-03-13 MED ORDER — SODIUM CHLORIDE 0.9 % IV SOLN
510.0000 mg | INTRAVENOUS | Status: DC
  Administered 2020-03-14: 510 mg via INTRAVENOUS
  Filled 2020-03-13: qty 17

## 2020-03-13 MED ORDER — SODIUM CHLORIDE 0.9% IV SOLUTION: Freq: Once | INTRAVENOUS | Status: DC

## 2020-03-13 NOTE — Plan of Care (Signed)

## 2020-03-13 NOTE — Progress Notes (Signed)
Lab reported low HGB 6.2 ,contacted [facility]  Answering service to inform on call of abnormal lab results.

## 2020-03-13 NOTE — Progress Notes (Signed)
Dr. Lorin Mercy return call on low HGB for patient in room 5N 25 ordered two units of packed red blood cells telephone order to be administered followed by post transfusion H/H

## 2020-03-13 NOTE — Progress Notes (Signed)
   Subjective: 2 Days Post-Op Procedure(s) (LRB): RIGHT BELOW KNEE AMPUTATION (Right) Patient reports pain as moderate.    Objective: Vital signs in last 24 hours: Temp:  [97.6 F (36.4 C)-98.1 F (36.7 C)] 97.9 F (36.6 C) (05/02 1034) Pulse Rate:  [58-68] 58 (05/02 1049) Resp:  [16-18] 18 (05/02 1049) BP: (86-128)/(50-63) 94/52 (05/02 1049) SpO2:  [99 %-100 %] 100 % (05/02 1049)  Intake/Output from previous day: 05/01 0701 - 05/02 0700 In: 1400 [P.O.:1400] Out: 750 [Urine:400; Drains:350] Intake/Output this shift: Total I/O In: 542 [P.O.:240; Blood:302] Out: 500 [Urine:500]  Recent Labs    03/11/20 1112 03/12/20 0334 03/13/20 0417  HGB 6.6* 7.6* 6.2*   Recent Labs    03/12/20 0334 03/13/20 0417  WBC 7.7 8.2  RBC 2.74* 2.24*  HCT 23.5* 20.2*  PLT 296 311   Recent Labs    03/11/20 1112  NA 133*  K 4.7  CL 103  CO2 19*  BUN 78*  CREATININE 2.51*  GLUCOSE 317*  CALCIUM 8.6*   Recent Labs    03/11/20 1112  INR 1.7*    getting 2nd unit PRBC .  No results found.  Assessment/Plan: 2 Days Post-Op Procedure(s) (LRB): RIGHT BELOW KNEE AMPUTATION (Right) Up with therapy likely tomorrow better progress after blood in.   Marybelle Killings 03/13/2020, 12:04 PM

## 2020-03-13 NOTE — Progress Notes (Addendum)
PT Progress Note  Patient underwent R transtibial amputation which impairs their ability to perform daily activities like ambulate in the home.  A walker alone will not resolve the issues with performing activities of daily living. A wheelchair will allow patient to safely perform daily activities.  The patient can self propel in the home or has a caregiver who can provide assistance.      Lorrin Goodell, PT  Office # [phone] Pager [phone]

## 2020-03-13 NOTE — TOC Progression Note (Signed)
Transition of Care [facility]) - Progression Note    Patient Details  Name: Alexander [name] MRN: [id number] Date of Birth: 1961/02/15  [facility]) CM/SW Contact  Claudie Leach, RN 03/13/2020, 3:42 PM  Clinical Narrative:    Patient plans to d/c home when medically stable.  Patient had [facility] services PTA for RN/wound vac.  Patient will need to d/c with PT/OT/?RN and will have [employer] wound vac.    Patient states he is having a ramp built on his house that will be started tomorrow.  He would prefer not to go home until it is finished; perhaps Tuesday.  Patient needs WC and 3n1.  WC note entered by PT.  Keon with [employer] made aware of order, but larger WCs are out of stock currently.  Keon/Zach will follow for availability.  [employer] also out of large WCs.       Barriers to Discharge: Continued Medical Work up  Expected Discharge Plan and Services                           DME Arranged: 3-N-1, Youth worker wheelchair with seat cushion DME Agency: [employer] Date DME Agency Contacted: 03/13/20 Time DME Agency Contacted: [phone] Representative spoke with at DME Agency: [place]: [facility] [place]

## 2020-03-13 NOTE — Progress Notes (Signed)
PT Cancellation Note  Patient Details Name: Alexander [name] MRN: [id number] DOB: [birth year]   Cancelled Treatment:    Reason Eval/Treat Not Completed: Medical issues which prohibited therapy. Pt with 6.2 Hgb. Scheduled to receive 2 units PRBCs.   Lorriane Shire 03/13/2020, 8:13 AM   Lorrin Goodell, PT  Office # [phone] Pager [phone]

## 2020-03-14 ENCOUNTER — Encounter (HOSPITAL_COMMUNITY): Payer: Medicare Other

## 2020-03-14 LAB — TYPE AND SCREEN
ABO/RH(D): A POS
Antibody Screen: NEGATIVE
Unit division: 0
Unit division: 0
Unit division: 0
Unit division: 0

## 2020-03-14 LAB — BPAM RBC
Blood Product Expiration Date: 202105222359
Blood Product Expiration Date: 202105252359
Blood Product Expiration Date: 202105252359
Blood Product Expiration Date: 202105272359
ISSUE DATE / TIME: 202104301300
ISSUE DATE / TIME: 202104301300
ISSUE DATE / TIME: 202105020624
ISSUE DATE / TIME: 202105021021
Unit Type and Rh: 6200
Unit Type and Rh: 6200
Unit Type and Rh: 6200
Unit Type and Rh: 6200

## 2020-03-14 LAB — GLUCOSE, CAPILLARY
Glucose-Capillary: 180 mg/dL — ABNORMAL HIGH (ref 70–99)
Glucose-Capillary: 225 mg/dL — ABNORMAL HIGH (ref 70–99)
Glucose-Capillary: 202 mg/dL — ABNORMAL HIGH (ref 70–99)
Glucose-Capillary: 191 mg/dL — ABNORMAL HIGH (ref 70–99)

## 2020-03-14 LAB — SURGICAL PATHOLOGY

## 2020-03-14 NOTE — Plan of Care (Signed)

## 2020-03-14 NOTE — Progress Notes (Signed)
Patient is status post below-knee amputation postop day #3.  He is lying in bed comfortably.  H&H yesterday was 8 and 25.  Patient's desire is to go home in the next 1 to 2 days  Vital signs stable afebrile alert pleasant in bed.  No further drainage and wound VAC total of 300 overall.  Wound VAC in place and functioning.  We will plan for discharge next 1 to 2 days

## 2020-03-14 NOTE — Progress Notes (Signed)
Occupational Therapy Treatment Patient Details Name: Alexander [name] MRN: [id number] DOB: 1960-11-30 Today's Date: 03/14/2020    History of present illness Pt is a 59 y/o male s/p R transtibial amputation. PMH including but not limited to PVD, a-fib, CKD, CAD, HLD, HTN.   OT comments  Pt continuing to progress toward established OT goals. Pt reporting pain level 3/10 upon arrival. Pt currently requires modA to powerup into standing with use of Clarise Cruz stedy to the transfer to [facility]. Pt requested to sit on [facility] for a bit to have BM, with RN permission, left pt on BSC with call bell. Pt will continue to benefit from skilled OT services to maximize safety and independence with ADL/IADL and functional mobility. Will continue to follow acutely and progress as tolerated.    Follow Up Recommendations  Home health OT    Equipment Recommendations  3 in 1 bedside commode;Other (comment)(with drop arm)    Recommendations for Other Services      Precautions / Restrictions Precautions Precautions: Fall Precaution Comments: wound VAC Required Braces or Orthoses: Other Brace Restrictions Weight Bearing Restrictions: Yes RLE Weight Bearing: Non weight bearing       Mobility Bed Mobility Overal bed mobility: Modified Independent             General bed mobility comments: sitting in recliner upon arrival  Transfers Overall transfer level: Needs assistance Equipment used: None Transfers: Sit to/from Stand Sit to Stand: Mod assist        Lateral/Scoot Transfers: Min guard General transfer comment: modA to powerup into standing    Balance Overall balance assessment: Needs assistance Sitting-balance support: Feet supported;No upper extremity supported Sitting balance-Leahy Scale: Good     Standing balance support: Bilateral upper extremity supported Standing balance-Leahy Scale: Poor Standing balance comment: use of stedy with BUE support to stand;able to progress into full upright  standing                           ADL either performed or assessed with clinical judgement   ADL Overall ADL's : Needs assistance/impaired Eating/Feeding: Independent;Sitting   Grooming: Set up;Sitting                   Toilet Transfer: Moderate assistance;BSC Toilet Transfer Details (indicate cue type and reason): with use of sara stedy Toileting- Clothing Manipulation and Hygiene: Moderate assistance;Sit to/from stand Toileting - Clothing Manipulation Details (indicate cue type and reason): with use of sara stedy     Functional mobility during ADLs: Moderate assistance(stedy) General ADL Comments: pt verbalized fear of "everything"     Vision       Perception     Praxis      Cognition Arousal/Alertness: Awake/alert Behavior During Therapy: WFL for tasks assessed/performed Overall Cognitive Status: Within Functional Limits for tasks assessed                                 General Comments: pt verbalized "fear of everything" prior to transfer to commode, unsure what pt meant specifically as he clarified he's not afraid of increased pain, appeared to have fear of falling        Exercises Amputee Exercises Quad Sets: AROM;Strengthening;Right;5 reps Hip ABduction/ADduction: AROM;Right;5 reps;Supine Hip Flexion/Marching: AROM;Right;5 reps;Supine   Shoulder Instructions       General Comments vss;pt with wash cloth under RLE with a few drops of blood  Pertinent Vitals/ Pain       Pain Assessment: 0-10 Pain Score: 3  Faces Pain Scale: Hurts a little bit Pain Location: Rt residual limb Pain Descriptors / Indicators: Discomfort;Sore Pain Intervention(s): Limited activity within patient's tolerance;Monitored during session  Home Living                                          Prior Functioning/Environment              Frequency  Min 2X/week        Progress Toward Goals  OT Goals(current goals can  now be found in the care plan section)  Progress towards OT goals: Progressing toward goals  Acute Rehab OT Goals Patient Stated Goal: return home OT Goal Formulation: With patient Time For Goal Achievement: 03/26/20 Potential to Achieve Goals: Good ADL Goals Pt Will Perform Grooming: with modified independence;sitting Pt Will Transfer to Toilet: with modified independence;stand pivot transfer;bedside commode Pt Will Perform Toileting - Clothing Manipulation and hygiene: with modified independence;sit to/from stand;with adaptive equipment Pt Will Perform Tub/Shower Transfer: Shower transfer;tub bench;rolling walker;ambulating;with supervision  Plan Discharge plan remains appropriate    Co-evaluation                 AM-PAC OT "6 Clicks" Daily Activity     Outcome Measure   Help from another person eating meals?: A Little Help from another person taking care of personal grooming?: A Little Help from another person toileting, which includes using toliet, bedpan, or urinal?: A Little Help from another person bathing (including washing, rinsing, drying)?: A Little Help from another person to put on and taking off regular upper body clothing?: A Little Help from another person to put on and taking off regular lower body clothing?: A Little 6 Click Score: 18    End of Session Equipment Utilized During Treatment: Gait belt;Other (comment)(sara stedy)  OT Visit Diagnosis: Other abnormalities of gait and mobility (R26.89);Pain Pain - Right/Left: Right Pain - part of body: Leg   Activity Tolerance Patient tolerated treatment well   Patient Left with call bell/phone within reach;Other (comment)(on BSC with RN permission)   Nurse Communication Mobility status(pt on [facility])        Time: 1452-1510 OT Time Calculation (min): 18 min  Charges: OT General Charges $OT Visit: 1 Visit OT Treatments $Self Care/Home Management : 8-22 mins  Helene Kelp OTR/L Acute Rehabilitation  Services Office: [place] 03/14/2020, 3:37 PM

## 2020-03-14 NOTE — Anesthesia Postprocedure Evaluation (Signed)
Anesthesia Post Note  Patient: EVERTON BERTHA  Procedure(s) Performed: RIGHT BELOW KNEE AMPUTATION (Right Knee)     Patient location during evaluation: PACU Anesthesia Type: General Level of consciousness: awake and alert Pain management: pain level controlled Vital Signs Assessment: post-procedure vital signs reviewed and stable Respiratory status: spontaneous breathing, nonlabored ventilation and respiratory function stable Cardiovascular status: blood pressure returned to baseline and stable Postop Assessment: no apparent nausea or vomiting Anesthetic complications: no    Last Vitals:  Vitals:   03/13/20 1403 03/13/20 1932  BP: (!) 125/59 (!) 139/57  Pulse: 62 65  Resp: 16 17  Temp: 36.9 C 37.1 C  SpO2: 100% 97%    Last Pain:  Vitals:   03/13/20 2100  TempSrc:   PainSc: 0-No pain                 Audry Pili

## 2020-03-14 NOTE — Progress Notes (Signed)
Physical Therapy Treatment Patient Details Name: Alexander [name] MRN: [id number] DOB: 02-06-1961 Today's Date: 03/14/2020    History of Present Illness Pt is a 59 y/o male s/p R transtibial amputation. PMH including but not limited to PVD, a-fib, CKD, CAD, HLD, HTN.    PT Comments    Patient progressing well towards PT goals. Tolerated lateral scoot transfer to drop arm recliner towards right side with cues needed for technique. Tolerated there ex for right residual limb. Reports some phantom pain. Reports he has been doing some there ex laying in bed. Encouraged OOB as much as tolerated now that pt has a drop arm recliner. Declined standing today. Will follow.    Follow Up Recommendations  Home health PT;Supervision/Assistance - 24 hour     Equipment Recommendations  3in1 (PT);Wheelchair (measurements PT);Wheelchair cushion (measurements PT);Other (comment)    Recommendations for Other Services       Precautions / Restrictions Precautions Precautions: Fall Precaution Comments: wound VAC Required Braces or Orthoses: Other Brace    Mobility  Bed Mobility Overal bed mobility: Modified Independent             General bed mobility comments: Increased time but no assist needed.  Transfers Overall transfer level: Needs assistance Equipment used: None Transfers: Lateral/Scoot Transfers          Lateral/Scoot Transfers: Min guard General transfer comment: Able to laterally scoot to drop arm recliner towards right with multiple small scoots and cues for technique. No assist needed. Declined standing.  Ambulation/Gait                 Stairs             Wheelchair Mobility    Modified Rankin (Stroke Patients Only)       Balance Overall balance assessment: Needs assistance Sitting-balance support: Feet supported;No upper extremity supported Sitting balance-Leahy Scale: Good                                      Cognition  Arousal/Alertness: Awake/alert Behavior During Therapy: WFL for tasks assessed/performed Overall Cognitive Status: Within Functional Limits for tasks assessed                                        Exercises Amputee Exercises Quad Sets: AROM;Strengthening;Right;5 reps Hip ABduction/ADduction: AROM;Right;5 reps;Supine Hip Flexion/Marching: AROM;Right;5 reps;Supine    General Comments        Pertinent Vitals/Pain Pain Assessment: Faces Faces Pain Scale: Hurts a little bit Pain Location: Rt residual limb Pain Descriptors / Indicators: Discomfort;Sore Pain Intervention(s): Repositioned;Monitored during session    Home Living                      Prior Function            PT Goals (current goals can now be found in the care plan section) Progress towards PT goals: Progressing toward goals    Frequency    Min 5X/week      PT Plan Current plan remains appropriate    Co-evaluation              AM-PAC PT "6 Clicks" Mobility   Outcome Measure  Help needed turning from your back to your side while in a flat bed without using bedrails?: None Help needed moving  from lying on your back to sitting on the side of a flat bed without using bedrails?: A Little Help needed moving to and from a bed to a chair (including a wheelchair)?: A Little Help needed standing up from a chair using your arms (e.g., wheelchair or bedside chair)?: A Little Help needed to walk in hospital room?: A Little Help needed climbing 3-5 steps with a railing? : Total 6 Click Score: 17    End of Session Equipment Utilized During Treatment: Gait belt Activity Tolerance: Patient tolerated treatment well Patient left: in chair;with call bell/phone within reach Nurse Communication: Mobility status PT Visit Diagnosis: Other abnormalities of gait and mobility (R26.89);Pain Pain - Right/Left: Right Pain - part of body: Leg     Time: 9509-3267 PT Time Calculation (min)  (ACUTE ONLY): 18 min  Charges:  $Therapeutic Activity: 8-22 mins                     Marisa Severin, PT, DPT Acute Rehabilitation Services Pager [phone] Office [place] 03/14/2020, 1:28 PM

## 2020-03-15 LAB — GLUCOSE, CAPILLARY
Glucose-Capillary: 222 mg/dL — ABNORMAL HIGH (ref 70–99)
Glucose-Capillary: 197 mg/dL — ABNORMAL HIGH (ref 70–99)
Glucose-Capillary: 204 mg/dL — ABNORMAL HIGH (ref 70–99)
Glucose-Capillary: 183 mg/dL — ABNORMAL HIGH (ref 70–99)

## 2020-03-15 NOTE — Plan of Care (Signed)

## 2020-03-15 NOTE — Progress Notes (Signed)
The patient is postop day #4 status post below-knee amputation on the right.  He is doing very well with physical therapy and Occupational Therapy is planning on going home with home health.  He is having a handicap ramp put on his front step today  Vital signs stable afebrile total of 350 cc in the back.  VAC is functioning well.  Pain control is good.   Status post above, doing well.  Patient will discharge tomorrow with home health occupational therapy physical therapy and DME.  Patient would like to make sure that his ramp gets installed today so he may have accessibility to his home which is difficult without the ramp

## 2020-03-15 NOTE — Care Management (Signed)
    Durable Medical Equipment  (From admission, onward)         Start     Ordered   03/15/20 1644  For home use only DME lightweight manual wheelchair with seat cushion  Once    Comments: Patient suffers from S/P Right transtibial amputation which impairs their ability to perform daily activities like TDDU:20254 in the home.  A walking YHC:[id number] will not resolve  issue with performing activities of daily living. A wheelchair will allow patient to safely perform daily activities. Patient is not able to propel themselves in the home using a standard weight wheelchair due to weakness:20653. Patient can self propel in the lightweight wheelchair. Length of need Timeframe:22503. Accessories: elevating leg rests (ELRs), wheel locks, extensions and anti-tippers.   03/15/20 1644   03/15/20 1643  For home use only DME 3 n 1  Once     03/15/20 1644   03/13/20 0916  For home use only DME 3 n 1  Once     03/13/20 0915   03/13/20 0914  For home use only DME lightweight manual wheelchair with seat cushion  Once    Comments: Patient suffers from right transtibial amputation which impairs their ability to perform daily activities like ADLs in the home.  A walker will not resolve  issue with performing activities of daily living. A wheelchair will allow patient to safely perform daily activities. Patient is not able to propel themselves in the home using a standard weight wheelchair due to weakness. Patient can self propel in the lightweight wheelchair. Length of need Lifetime. Accessories: elevating leg rests (ELRs), wheel locks, extensions and anti-tippers, back cushion, amputee leg support.   03/13/20 0915

## 2020-03-15 NOTE — Plan of Care (Signed)

## 2020-03-15 NOTE — TOC Initial Note (Signed)
Transition of Care [facility]) - Initial/Assessment Note    Patient Details  Name: Alexander [name] MRN: [id number] Date of Birth: [birth year]  Transition of Care [facility]) CM/SW Contact:    Curlene Labrum, RN Phone Number: 03/15/2020, 4:52 PM  Clinical Narrative:                 Case management met with the patient, who lives at home with his wife.  Patient is S/P Right transtibial amputation by [name].  The patient is waiting for a ramp to be built by the neighbor at his house today 5/4 - but due to the weather, was unable to have the ramp built.  However, the wife stated that the patient's daughter purchased an ATV ramp from [employer] to use in the meantime.  Wife was given instructions that a Wheelchair and 3:1 were ordered from [employer].  [employer] also can ramp availability to rent from the office in [place] for $700 to purchase a $95 a month to rent.  Patient given choice regarding home health services - patient currently receiving services through [facility] for RN.  Called [facility] and RN, PT and OT ordered.  Will continue to follow tomorrow to insure ramp is available and equipment delivered to the hospital room prior to discharge.  Expected Discharge Plan: [place] Barriers to Discharge: (Waiting on family to place ramp today - if not will offer ramp to rent from [employer].)   Patient Goals and CMS Choice Patient states their goals for this hospitalization and ongoing recovery are:: I'm ready to go home but I can't go home until I can get my wheelchair and ramp at the house so I can get into the house. CMS Medicare.gov Compare Post Acute Care list provided to:: Patient Choice offered to / list presented to : Spouse  Expected Discharge Plan and Services Expected Discharge Plan: [place]   Discharge Planning Services: CM Consult Post Acute Care Choice: [place] arrangements for the past 2 months: Single Family Home                 DME  Arranged: 3-N-1, Wheelchair manual(Needs ramp to get up 2 steps at home) DME Agency: [facility](wheelchair with amputee left, 3:1 and possible ramp.) Date DME Agency Contacted: 03/15/20 Time DME Agency Contacted: [phone] Representative spoke with at DME Agency: [facility] [place] Arranged: RN, PT, OT [place] Agency: [facility] [place] Date [place]: 03/15/20 Time [place]: [place] Representative spoke with at [place]: Lauretta Chester  Prior Living Arrangements/Services Living arrangements for the past 2 months: [place] with:: Spouse Patient language and need for interpreter reviewed:: Yes Do you feel safe going back to the place where you live?: Yes      Need for Family Participation in Patient Care: Yes (Comment) Care giver support system in place?: Yes (comment) Current home services: Home RN(current services with RN) Criminal Activity/Legal Involvement Pertinent to Current Situation/Hospitalization: No - Comment as needed  Activities of Daily Living Home Assistive Devices/Equipment: CBG Meter, Walker (specify type), Cane (specify quad or straight) ADL Screening (condition at time of admission) Patient's cognitive ability adequate to safely complete daily activities?: Yes Is the patient deaf or have difficulty hearing?: No Does the patient have difficulty seeing, even when wearing glasses/contacts?: No Does the patient have difficulty concentrating, remembering, or making decisions?: No Patient able to express need for assistance with ADLs?: Yes Does the patient have difficulty dressing  or bathing?: No Independently performs ADLs?: Yes (appropriate for developmental age) Does the patient have difficulty walking or climbing stairs?: Yes Weakness of Legs: Right Weakness of Arms/Hands: None  Permission Sought/Granted Permission sought to share information with : Case Manager Permission granted to share information with : Yes, Verbal Permission Granted      Permission granted to share info w AGENCY: [facility], [facility]  Permission granted to share info w Relationship: wife     Emotional Assessment Appearance:: Appears stated age   Affect (typically observed): Accepting Orientation: : Oriented to Self, Oriented to Place, Oriented to  Time, Oriented to Situation   Psych Involvement: No (comment)  Admission diagnosis:  Acute hematogenous osteomyelitis of right foot [facility]) [M86.071] Patient Active Problem List   Diagnosis Date Noted  . Acute hematogenous osteomyelitis of right foot ([place]) 03/11/2020  . Gangrene of right foot ([place])   . Diabetic foot ulcer associated with type 2 diabetes mellitus ([place]) 02/19/2020  . Heel ulcer ([place]) 11/05/2019  . Atherosclerotic PVD with ulceration ([place]) 09/09/2017  . Essential hypertension, benign 02/07/2015  . Renal insufficiency 02/07/2015  . Post PTCA 11/09/2014  . S/P PTCA (percutaneous transluminal coronary angioplasty) 11/09/2014  . Pacemaker: [place] DR MRI [id number] Dual chamber pacemaker 10/08/2014 10/08/2014  . Encounter for care of pacemaker 10/08/2014  . Sinus node dysfunction ([place]) 10/08/2014  . Cardiac asystole ([employer]) 10/07/2014  . Cardiac syncope 10/07/2014  . Diabetes mellitus with stage 3 chronic kidney disease ([place]) 10/07/2014  . CAD (coronary artery disease), native coronary artery 10/07/2014  . Diabetes mellitus due to underlying condition without complications ([place]) 89/16/9450  . Anemia, unspecified 05/25/2014  . Syncope 04/30/2014  . Atherosclerosis of native arteries of the extremities with gangrene ([place]) 12/16/2013  . Volume overload 10/30/2013  . Acute renal failure ([place]) 10/28/2013  . PAD (peripheral artery disease) ([place]) 10/27/2013  . Leukocytosis 10/21/2013  . Anemia 10/21/2013  . Osteomyelitis of left foot ([place]) 08/19/2013  . Spinal stenosis, lumbar region, with neurogenic claudication 06/12/2012    Class: Diagnosis of  . DIABETES MELLITUS, TYPE II 03/15/2009  .  ALCOHOL ABUSE 03/15/2009  . TOBACCO USE 03/15/2009  . HYPERTENSION 03/15/2009   PCP:  [name], MD Pharmacy:   [place], [address] [place] [address] Phone: [phone] Fax: [phone]     Social Determinants of Health (SDOH) Interventions    Readmission Risk Interventions Readmission Risk Prevention Plan 03/15/2020  Transportation Screening Complete  PCP or Specialist Appt within 5-7 Days Complete  Home Care Screening Complete  Medication Review (RN CM) Complete  Some recent data might be hidden

## 2020-03-15 NOTE — Progress Notes (Signed)
Physical Therapy Treatment Patient Details Name: Alexander [name] MRN: [id number] DOB: [birth year] Today's Date: 03/15/2020    History of Present Illness Pt is a 59 y/o male s/p R transtibial amputation. PMH including but not limited to PVD, a-fib, CKD, CAD, HLD, HTN.    PT Comments    Patient progressing well towards PT goals. Tolerated therex in sidelying, supine and seated in chair for right residual limb. Pt with limited right hip extension. Encouraged performing exercises a few times daily to improve AROM/strength of right residual limb. Able to perform lateral scoot transfer to chair with Min guard assist to stabilize chair. Would benefit from practice with standing trials during stedy as this is how pt is getting into bathroom. Reports family is working on getting a ramp built. Will follow.   Follow Up Recommendations  Home health PT;Supervision/Assistance - 24 hour     Equipment Recommendations  3in1 (PT);Wheelchair (measurements PT);Wheelchair cushion (measurements PT);Other (comment)    Recommendations for Other Services       Precautions / Restrictions Precautions Precautions: Fall Precaution Comments: wound VAC Required Braces or Orthoses: Other Brace Other Brace: limb guard Restrictions Weight Bearing Restrictions: Yes RLE Weight Bearing: Non weight bearing    Mobility  Bed Mobility Overal bed mobility: Needs Assistance Bed Mobility: Sidelying to Sit;Rolling Rolling: Supervision Sidelying to sit: Supervision;HOB elevated       General bed mobility comments: Supervision for safety. Able to roll onto left side for there ex and onto right side to prepare to getting EOB.  Transfers Overall transfer level: Needs assistance Equipment used: None Transfers: Lateral/Scoot Transfers          Lateral/Scoot Transfers: Min guard General transfer comment: performed lateral scoot to chair towards right.  Ambulation/Gait                 Stairs              Wheelchair Mobility    Modified Rankin (Stroke Patients Only)       Balance Overall balance assessment: Needs assistance Sitting-balance support: Feet supported;No upper extremity supported Sitting balance-Leahy Scale: Good                                      Cognition Arousal/Alertness: Awake/alert Behavior During Therapy: WFL for tasks assessed/performed Overall Cognitive Status: Within Functional Limits for tasks assessed                                        Exercises Amputee Exercises Quad Sets: AROM;Strengthening;Right;10 reps Hip Extension: Strengthening;Right;10 reps;Sidelying Hip ABduction/ADduction: Strengthening;Right;10 reps;Sidelying Knee Flexion: AROM;Right;10 reps;Supine Knee Extension: AROM;Right;10 reps;Supine Straight Leg Raises: Strengthening;Right;10 reps;Supine Chair Push Up: Strengthening;5 reps;Seated    General Comments General comments (skin integrity, edema, etc.): VSS      Pertinent Vitals/Pain Pain Assessment: Faces Faces Pain Scale: Hurts a little bit Pain Location: Rt residual limb with movement esp in dependent position Pain Descriptors / Indicators: Discomfort;Sore;Aching Pain Intervention(s): Monitored during session;Repositioned    Home Living                      Prior Function            PT Goals (current goals can now be found in the care plan section) Progress towards PT goals: Progressing toward goals  Frequency    Min 5X/week      PT Plan Current plan remains appropriate    Co-evaluation              AM-PAC PT "6 Clicks" Mobility   Outcome Measure  Help needed turning from your back to your side while in a flat bed without using bedrails?: None Help needed moving from lying on your back to sitting on the side of a flat bed without using bedrails?: A Little Help needed moving to and from a bed to a chair (including a wheelchair)?: A Little Help needed  standing up from a chair using your arms (e.g., wheelchair or bedside chair)?: A Lot Help needed to walk in hospital room?: A Lot Help needed climbing 3-5 steps with a railing? : Total 6 Click Score: 15    End of Session   Activity Tolerance: Patient tolerated treatment well Patient left: in chair;with call bell/phone within reach Nurse Communication: Mobility status PT Visit Diagnosis: Other abnormalities of gait and mobility (R26.89);Pain Pain - Right/Left: Right Pain - part of body: Leg     Time: 2449-7530 PT Time Calculation (min) (ACUTE ONLY): 20 min  Charges:  $Therapeutic Exercise: 8-22 mins                     Marisa Severin, PT, DPT Acute Rehabilitation Services Pager [phone] Office [place] 03/15/2020, 11:45 AM

## 2020-03-16 LAB — GLUCOSE, CAPILLARY
Glucose-Capillary: 162 mg/dL — ABNORMAL HIGH (ref 70–99)
Glucose-Capillary: 238 mg/dL — ABNORMAL HIGH (ref 70–99)
Glucose-Capillary: 149 mg/dL — ABNORMAL HIGH (ref 70–99)
Glucose-Capillary: 201 mg/dL — ABNORMAL HIGH (ref 70–99)

## 2020-03-16 MED ORDER — OXYCODONE HCL 5 MG PO TABS: 5.0000 mg | ORAL_TABLET | ORAL | 0 refills | Status: DC | PRN

## 2020-03-16 NOTE — Progress Notes (Signed)
This is a pleasant gentleman who is status post right below-knee amputation day 5.  He is doing well.  He has been progressing with physical therapy.  He is prepared to go home.  He is asking me to check his left foot he is status post transmetatarsal amputations and nurses have placed a dressing there and he wanted to be sure there was nothing that were concerning  Vital signs stable he did have some hypotension yesterday and blood per her medication was held.  He is not tachycardic is comfortable.  0 cc drainage in the back  Focused examination of the right side demonstrates VAC is functioning and ready to transition to a Prevena pump.  He will go home with this and this will be removed in the office next week.  He should follow-up with the PA or nurse practitioner early next week for removal of the VAC.  Focused examination of his left transmetatarsal amputation stump there is no erythema no fluctuance no tenderness he has a very small pressure area on the lateral side of the heel there is no foul odor it is not even broken the skin.  No surrounding cellulitis.  Patient will be discharged to home with home health today.  I discussed with him the importance of elevating his left leg on a pillow so his heel does not have any contact pressure.  He will go home with a Prevena VAC.  Follow-up next week in our office

## 2020-03-16 NOTE — Progress Notes (Signed)
Physical Therapy Treatment Patient Details Name: Alexander [name] MRN: [id number] DOB: 06-14-1961 Today's Date: 03/16/2020    History of Present Illness Pt is a 59 y/o male s/p R transtibial amputation. PMH including but not limited to PVD, a-fib, CKD, CAD, HLD, HTN.    PT Comments    Pt progressing well towards his physical therapy goals. Session focused on serial sit to stands for functional strengthening. Requiring two person moderate assist to stand from recliner. Postural re-education provided to promote upright and glute activation. Educated pt on performing lateral scoot transfer at home to increase independence/safety but can continue to work on standing and pre gait training when therapy is present. D/c plan remains appropriate.    Follow Up Recommendations  Home health PT;Supervision/Assistance - 24 hour     Equipment Recommendations  3in1 (PT);Wheelchair (measurements PT);Wheelchair cushion (measurements PT);Other (comment)(amputee support legrest)    Recommendations for Other Services       Precautions / Restrictions Precautions Precautions: Fall Precaution Comments: wound VAC Required Braces or Orthoses: Other Brace Other Brace: limb guard Restrictions Weight Bearing Restrictions: Yes RLE Weight Bearing: Non weight bearing    Mobility  Bed Mobility               General bed mobility comments: pt sitting in recliner upon arrival  Transfers Overall transfer level: Needs assistance Equipment used: Rolling walker (2 wheeled) Transfers: Sit to/from Stand Sit to Stand: Mod assist;+2 physical assistance;+2 safety/equipment         General transfer comment: sit<>stand x3 with modA+2;1x loss of balance requiring increased effort from therapists to stabilize  Ambulation/Gait                 Stairs             Wheelchair Mobility    Modified Rankin (Stroke Patients Only)       Balance Overall balance assessment: Needs  assistance Sitting-balance support: Feet supported;No upper extremity supported Sitting balance-Leahy Scale: Good     Standing balance support: Bilateral upper extremity supported Standing balance-Leahy Scale: Poor Standing balance comment: able to progress into full upright standing with +2 for powerup                            Cognition Arousal/Alertness: Awake/alert Behavior During Therapy: WFL for tasks assessed/performed;Flat affect Overall Cognitive Status: Within Functional Limits for tasks assessed                                        Exercises Amputee Exercises Knee Extension: Right;10 reps;Seated Chair Push Up: Strengthening;Seated;10 reps Glute sets: Strengthening; Standing; 10 reps Other Exercises Other Exercises: Educated provided on prone positioning for hip extension flexibility    General Comments        Pertinent Vitals/Pain Pain Assessment: Faces Faces Pain Scale: Hurts a little bit Pain Location: Rt residual limb with movement esp in dependent position Pain Descriptors / Indicators: Discomfort;Sore;Aching Pain Intervention(s): Limited activity within patient's tolerance;Monitored during session    Home Living                      Prior Function            PT Goals (current goals can now be found in the care plan section) Acute Rehab PT Goals Patient Stated Goal: return home Potential to Achieve Goals:  Good Progress towards PT goals: Progressing toward goals    Frequency    Min 5X/week      PT Plan Current plan remains appropriate    Co-evaluation PT/OT/SLP Co-Evaluation/Treatment: Yes Reason for Co-Treatment: For patient/therapist safety;To address functional/ADL transfers PT goals addressed during session: Mobility/safety with mobility OT goals addressed during session: ADL's and self-care      AM-PAC PT "6 Clicks" Mobility   Outcome Measure  Help needed turning from your back to your side  while in a flat bed without using bedrails?: None Help needed moving from lying on your back to sitting on the side of a flat bed without using bedrails?: A Little Help needed moving to and from a bed to a chair (including a wheelchair)?: A Little Help needed standing up from a chair using your arms (e.g., wheelchair or bedside chair)?: A Lot Help needed to walk in hospital room?: Total Help needed climbing 3-5 steps with a railing? : Total 6 Click Score: 14    End of Session Equipment Utilized During Treatment: Gait belt Activity Tolerance: Patient tolerated treatment well Patient left: in chair;with call bell/phone within reach Nurse Communication: Mobility status PT Visit Diagnosis: Other abnormalities of gait and mobility (R26.89);Pain Pain - Right/Left: Right Pain - part of body: (residual limb)     Time: 1237-1300 PT Time Calculation (min) (ACUTE ONLY): 23 min  Charges:  $Therapeutic Activity: 8-22 mins                       [name], PT, DPT Acute Rehabilitation Services Pager [phone] Office [phone]    [name] 03/16/2020, 2:42 PM

## 2020-03-16 NOTE — Plan of Care (Signed)
  Problem: Education: Goal: Knowledge of General Education information will improve Description Including pain rating scale, medication(s)/side effects and non-pharmacologic comfort measures Outcome: Progressing   Problem: Health Behavior/Discharge Planning: Goal: Ability to manage health-related needs will improve Outcome: Progressing   

## 2020-03-16 NOTE — Plan of Care (Signed)

## 2020-03-16 NOTE — Care Management (Signed)
Patient needs a heavy duty 3:1 commode for home - due to body habitus.  Patient is 122 kg.

## 2020-03-16 NOTE — Progress Notes (Signed)
Occupational Therapy Treatment Patient Details Name: Alexander [name] MRN: [id number] DOB: November 27, 1960 Today's Date: 03/16/2020    History of present illness Pt is a 59 y/o male s/p R transtibial amputation. PMH including but not limited to PVD, a-fib, CKD, CAD, HLD, HTN.   OT comments  Pt sitting in recliner upon OT/PT arrival, pt agreeable to session. He required minguard to don sock and shoe while sitting in recliner. He required modA+2 for sit<>stand from recliner x3. Educated pt to only work on sit<>stand during therapy. Pt will continue to benefit from skilled OT services to maximize safety and independence with ADL/IADL and functional mobility. Will continue to follow acutely and progress as tolerated.    Follow Up Recommendations  Home health OT    Equipment Recommendations  3 in 1 bedside commode;Other (comment)(with drop arm)    Recommendations for Other Services      Precautions / Restrictions Precautions Precautions: Fall Precaution Comments: wound VAC Required Braces or Orthoses: Other Brace Other Brace: limb guard Restrictions Weight Bearing Restrictions: Yes RLE Weight Bearing: Non weight bearing       Mobility Bed Mobility               General bed mobility comments: pt sitting in recliner upon arrival  Transfers Overall transfer level: Needs assistance Equipment used: Rolling walker (2 wheeled) Transfers: Sit to/from Stand Sit to Stand: Mod assist;+2 physical assistance;+2 safety/equipment         General transfer comment: sit<>stand x3 with modA+2;1x loss of balance requiring increased effort from therapists to stabilize    Balance Overall balance assessment: Needs assistance Sitting-balance support: Feet supported;No upper extremity supported Sitting balance-Leahy Scale: Good     Standing balance support: Bilateral upper extremity supported Standing balance-Leahy Scale: Poor Standing balance comment: able to progress into full upright  standing with +2 for powerup                           ADL either performed or assessed with clinical judgement   ADL Overall ADL's : Needs assistance/impaired     Grooming: Set up;Sitting               Lower Body Dressing: Min guard Lower Body Dressing Details (indicate cue type and reason): donned sock and shoe with figure-4     Toileting- Clothing Manipulation and Hygiene: Moderate assistance;+2 for physical assistance;+2 for safety/equipment;Sit to/from stand Toileting - Clothing Manipulation Details (indicate cue type and reason): to powerup and stabilize     Functional mobility during ADLs: Moderate assistance;+2 for physical assistance;+2 for safety/equipment General ADL Comments: pt verbalized fear of "everything"     Vision       Perception     Praxis      Cognition Arousal/Alertness: Awake/alert Behavior During Therapy: WFL for tasks assessed/performed;Flat affect Overall Cognitive Status: Within Functional Limits for tasks assessed                                          Exercises Amputee Exercises Chair Push Up: Strengthening;Seated;10 reps   Shoulder Instructions       General Comments      Pertinent Vitals/ Pain       Pain Assessment: Faces Faces Pain Scale: Hurts a little bit Pain Location: Rt residual limb with movement esp in dependent position Pain Descriptors / Indicators: Discomfort;Sore;Aching Pain  Intervention(s): Monitored during session;Limited activity within patient's tolerance  Home Living                                          Prior Functioning/Environment              Frequency  Min 2X/week        Progress Toward Goals  OT Goals(current goals can now be found in the care plan section)  Progress towards OT goals: Progressing toward goals  Acute Rehab OT Goals Patient Stated Goal: return home OT Goal Formulation: With patient Time For Goal Achievement:  03/26/20 Potential to Achieve Goals: Good ADL Goals Pt Will Perform Grooming: with modified independence;sitting Pt Will Transfer to Toilet: with modified independence;stand pivot transfer;bedside commode Pt Will Perform Toileting - Clothing Manipulation and hygiene: with modified independence;sit to/from stand;with adaptive equipment Pt Will Perform Tub/Shower Transfer: Shower transfer;tub bench;rolling walker;ambulating;with supervision  Plan Discharge plan remains appropriate    Co-evaluation    PT/OT/SLP Co-Evaluation/Treatment: Yes Reason for Co-Treatment: Complexity of the patient's impairments (multi-system involvement);For patient/therapist safety;To address functional/ADL transfers   OT goals addressed during session: ADL's and self-care      AM-PAC OT "6 Clicks" Daily Activity     Outcome Measure   Help from another person eating meals?: A Little Help from another person taking care of personal grooming?: A Little Help from another person toileting, which includes using toliet, bedpan, or urinal?: A Little Help from another person bathing (including washing, rinsing, drying)?: A Little Help from another person to put on and taking off regular upper body clothing?: A Little Help from another person to put on and taking off regular lower body clothing?: A Little 6 Click Score: 18    End of Session Equipment Utilized During Treatment: Gait belt;Rolling walker  OT Visit Diagnosis: Other abnormalities of gait and mobility (R26.89);Pain Pain - Right/Left: Right Pain - part of body: Leg   Activity Tolerance Patient tolerated treatment well   Patient Left with call bell/phone within reach;in chair   Nurse Communication Mobility status        Time: 1237-1300 OT Time Calculation (min): 23 min  Charges: OT General Charges $OT Visit: 1 Visit OT Treatments $Self Care/Home Management : 8-22 mins  Helene Kelp OTR/L Acute Rehabilitation Services Office:  [place] 03/16/2020, 1:18 PM

## 2020-03-16 NOTE — Discharge Summary (Signed)
Discharge Diagnoses:  Active Problems:   Gangrene of right foot ([place])   Acute hematogenous osteomyelitis of right foot ([place])   Surgeries: Procedure(s): RIGHT BELOW KNEE AMPUTATION on 03/11/2020    Consultants:   Discharged Condition: Improved  Hospital Course: Alexander [name] is an 59 y.o. male who was admitted 03/11/2020 with a chief complaint of Right Foot osteomyelitis, with a final diagnosis of dehiscence right foot wound.  Patient was brought to the operating room on 03/11/2020 and underwent Procedure(s): RIGHT BELOW KNEE AMPUTATION.    Patient was given perioperative antibiotics:  Anti-infectives (From admission, onward)   Start     Dose/Rate Route Frequency Ordered Stop   03/11/20 2030  ceFAZolin (ANCEF) IVPB 2g/100 mL premix     2 g 200 mL/hr over 30 Minutes Intravenous Every 6 hours 03/11/20 2005 03/12/20 1115   03/11/20 1115  ceFAZolin (ANCEF) 3 g in dextrose 5 % 50 mL IVPB     3 g 100 mL/hr over 30 Minutes Intravenous On call to O.R. 03/11/20 1110 03/11/20 1408    .  Patient was given sequential compression devices, early ambulation, and aspirin for DVT prophylaxis.  Recent vital signs:  Patient Vitals for the past 24 hrs:  BP Temp Temp src Pulse Resp SpO2  03/16/20 0358 (!) 135/59 97.7 F (36.5 C) Oral 67 17 97 %  03/15/20 2012 136/61 98.6 F (37 C) Oral 66 16 97 %  03/15/20 1513 (!) 109/49 (!) 97.3 F (36.3 C) Oral 64 17 99 %  03/15/20 0813 129/71 98.2 F (36.8 C) Oral 66 17 99 %  .  Recent laboratory studies: No results found.  Discharge Medications:   Allergies as of 03/16/2020      Reactions   Keflex [cephalexin] Nausea And Vomiting   Pletal [cilostazol] Palpitations   "Can hear heart beating loudly".   Sulfa Antibiotics Rash      Medication List    STOP taking these medications   ciprofloxacin 250 MG tablet Commonly known as: CIPRO   doxycycline 100 MG capsule Commonly known as: VIBRAMYCIN   oxyCODONE-acetaminophen 5-325 MG tablet Commonly  known as: Percocet     TAKE these medications   Accu-Chek Advantage Diabetes kit Use as instructed   Accu-Chek FastClix Lancets Misc Check blood sugar TID & QHS   apixaban 5 MG Tabs tablet Commonly known as: Eliquis Take 1 tablet (5 mg total) by mouth 2 (two) times daily. DO NOT TAKE THIS MEDICATION UNTIL WE RESTART IT AFTER YOUR SURGERY.   aspirin 81 MG EC tablet Take 81 mg by mouth daily.   atorvastatin 20 MG tablet Commonly known as: LIPITOR Take 1 tablet by mouth once daily   blood glucose meter kit and supplies Kit Check blood sugar TID & QHS   furosemide 40 MG tablet Commonly known as: LASIX Take 40 mg by mouth 2 (two) times daily with a meal.   glucose blood test strip Use as instructed   glucose blood test strip Commonly known as: Accu-Chek Aviva Plus Use as instructed   glucose blood test strip Commonly known as: Choice DM Fora G20 Test Strips Check blood sugar TID & QHS   hydrALAZINE 100 MG tablet Commonly known as: APRESOLINE TAKE 1 TABLET BY MOUTH THREE TIMES DAILY What changed: when to take this   insulin NPH-regular Human (70-30) 100 UNIT/ML injection Inject 46-56 Units into the skin See admin instructions. Inject 56 units subcutaneously before breakfast and 46 units before supper   lisinopril 10 MG tablet  Commonly known as: ZESTRIL Take 1 tablet (10 mg total) by mouth daily.   metoprolol tartrate 100 MG tablet Commonly known as: LOPRESSOR Take 1 tablet by mouth twice daily   multivitamin with minerals Tabs tablet Take 1 tablet by mouth daily.   oxyCODONE 5 MG immediate release tablet Commonly known as: Oxy IR/ROXICODONE Take 1-2 tablets (5-10 mg total) by mouth every 4 (four) hours as needed for moderate pain (pain score 4-6).   prazosin 2 MG capsule Commonly known as: MINIPRESS TAKE 1 CAPSULE BY MOUTH IN THE EVENING AFTER SUPPER What changed: See the new instructions.            Durable Medical Equipment  (From admission,  onward)         Start     Ordered   03/15/20 1644  For home use only DME lightweight manual wheelchair with seat cushion  Once    Comments: Patient suffers from S/P Right transtibial amputation which impairs their ability to perform daily activities like [id number] in the home.  A walking [id number] will not resolve  issue with performing activities of daily living. A wheelchair will allow patient to safely perform daily activities. Patient is not able to propel themselves in the home using a standard weight wheelchair due to weakness:20653. Patient can self propel in the lightweight wheelchair. Length of need Timeframe:22503. Accessories: elevating leg rests (ELRs), wheel locks, extensions and anti-tippers.   03/15/20 1644   03/15/20 1643  For home use only DME 3 n 1  Once     03/15/20 1644   03/13/20 0916  For home use only DME 3 n 1  Once     03/13/20 0915   03/13/20 0914  For home use only DME lightweight manual wheelchair with seat cushion  Once    Comments: Patient suffers from right transtibial amputation which impairs their ability to perform daily activities like ADLs in the home.  A walker will not resolve  issue with performing activities of daily living. A wheelchair will allow patient to safely perform daily activities. Patient is not able to propel themselves in the home using a standard weight wheelchair due to weakness. Patient can self propel in the lightweight wheelchair. Length of need Lifetime. Accessories: elevating leg rests (ELRs), wheel locks, extensions and anti-tippers, back cushion, amputee leg support.   03/13/20 0915          Diagnostic Studies: DG Foot Complete Right  Result Date: 02/15/2020 CLINICAL DATA:  Infection.  Wound will not heal. EXAM: RIGHT FOOT COMPLETE - 3+ VIEW COMPARISON:  11/16/2016 FINDINGS: 18 prior amputation of the 5th toe including the 5th metatarsal and the 4th toe at the MTP joint. Soft tissue gas noted in the lateral soft tissues. Soft  tissue swelling. No current bone destruction to suggest acute osteomyelitis. No fracture, subluxation or dislocation. IMPRESSION: Prior amputation as above. No evidence for current acute osteomyelitis. Electronically Signed   By: Rolm Baptise M.D.   On: 02/15/2020 18:40    Patient benefited maximally from their hospital stay and there were no complications.     Disposition:  There are no questions and answers to display.       Discharge Instructions    Negative Pressure Wound Therapy - Incisional   Complete by: As directed    Show patient how to attach Gargatha, Roosevelt, NP In 1 week.   Specialty: Orthopedic Surgery Contact information: [address]  [place] [address] [phone]            Signed: Bevely Palmer Cece Milhouse 03/16/2020, 7:30 AM

## 2020-03-17 LAB — GLUCOSE, CAPILLARY
Glucose-Capillary: 158 mg/dL — ABNORMAL HIGH (ref 70–99)
Glucose-Capillary: 176 mg/dL — ABNORMAL HIGH (ref 70–99)

## 2020-03-17 NOTE — Discharge Summary (Signed)
Discharge Diagnoses:  Active Problems:   Gangrene of right foot ([place])   Acute hematogenous osteomyelitis of right foot ([place])   Surgeries: Procedure(s): RIGHT BELOW KNEE AMPUTATION on 03/11/2020    Consultants:   Discharged Condition: Improved  Hospital Course: Alexander [name] is an 59 y.o. male who was admitted 03/11/2020 with a chief complaint of right foot pain, with a final diagnosis of dehiscence right foot wound.  Patient was brought to the operating room on 03/11/2020 and underwent Procedure(s): RIGHT BELOW KNEE AMPUTATION.    Patient was given perioperative antibiotics:  Anti-infectives (From admission, onward)   Start     Dose/Rate Route Frequency Ordered Stop   03/11/20 2030  ceFAZolin (ANCEF) IVPB 2g/100 mL premix     2 g 200 mL/hr over 30 Minutes Intravenous Every 6 hours 03/11/20 2005 03/12/20 1115   03/11/20 1115  ceFAZolin (ANCEF) 3 g in dextrose 5 % 50 mL IVPB     3 g 100 mL/hr over 30 Minutes Intravenous On call to O.R. 03/11/20 1110 03/11/20 1408    .  Patient was given sequential compression devices, early ambulation, and aspirin for DVT prophylaxis.  Recent vital signs:  Patient Vitals for the past 24 hrs:  BP Temp Temp src Pulse Resp SpO2  03/17/20 0411 (!) 108/58 98.3 F (36.8 C) Oral 66 16 96 %  03/16/20 2208 (!) 135/55 - - - - -  03/16/20 1917 (!) 104/51 98.2 F (36.8 C) Oral 79 16 97 %  03/16/20 1500 126/62 98.1 F (36.7 C) Oral 66 18 99 %  03/16/20 0845 (!) 109/52 98.5 F (36.9 C) Oral 64 18 99 %  .  Recent laboratory studies: No results found.  Discharge Medications:   Allergies as of 03/17/2020      Reactions   Keflex [cephalexin] Nausea And Vomiting   Pletal [cilostazol] Palpitations   "Can hear heart beating loudly".   Sulfa Antibiotics Rash      Medication List    STOP taking these medications   ciprofloxacin 250 MG tablet Commonly known as: CIPRO   doxycycline 100 MG capsule Commonly known as: VIBRAMYCIN    oxyCODONE-acetaminophen 5-325 MG tablet Commonly known as: Percocet     TAKE these medications   Accu-Chek Advantage Diabetes kit Use as instructed   Accu-Chek FastClix Lancets Misc Check blood sugar TID & QHS   apixaban 5 MG Tabs tablet Commonly known as: Eliquis Take 1 tablet (5 mg total) by mouth 2 (two) times daily. DO NOT TAKE THIS MEDICATION UNTIL WE RESTART IT AFTER YOUR SURGERY.   aspirin 81 MG EC tablet Take 81 mg by mouth daily.   atorvastatin 20 MG tablet Commonly known as: LIPITOR Take 1 tablet by mouth once daily   blood glucose meter kit and supplies Kit Check blood sugar TID & QHS   furosemide 40 MG tablet Commonly known as: LASIX Take 40 mg by mouth 2 (two) times daily with a meal.   glucose blood test strip Use as instructed   glucose blood test strip Commonly known as: Accu-Chek Aviva Plus Use as instructed   glucose blood test strip Commonly known as: Choice DM Fora G20 Test Strips Check blood sugar TID & QHS   hydrALAZINE 100 MG tablet Commonly known as: APRESOLINE TAKE 1 TABLET BY MOUTH THREE TIMES DAILY What changed: when to take this   insulin NPH-regular Human (70-30) 100 UNIT/ML injection Inject 46-56 Units into the skin See admin instructions. Inject 56 units subcutaneously before breakfast and  46 units before supper   lisinopril 10 MG tablet Commonly known as: ZESTRIL Take 1 tablet (10 mg total) by mouth daily.   metoprolol tartrate 100 MG tablet Commonly known as: LOPRESSOR Take 1 tablet by mouth twice daily   multivitamin with minerals Tabs tablet Take 1 tablet by mouth daily.   oxyCODONE 5 MG immediate release tablet Commonly known as: Oxy IR/ROXICODONE Take 1-2 tablets (5-10 mg total) by mouth every 4 (four) hours as needed for moderate pain (pain score 4-6).   prazosin 2 MG capsule Commonly known as: MINIPRESS TAKE 1 CAPSULE BY MOUTH IN THE EVENING AFTER SUPPER What changed: See the new instructions.             Durable Medical Equipment  (From admission, onward)         Start     Ordered   03/16/20 1540  For home use only DME 3 n 1  Once    Comments: Needs heavy duty 3:1 for body habitus - BMI of 122 kg   03/16/20 1552   03/15/20 1644  For home use only DME lightweight manual wheelchair with seat cushion  Once    Comments: Patient suffers from S/P Right transtibial amputation which impairs their ability to perform daily activities like [id number] in the home.  A walking [id number] will not resolve  issue with performing activities of daily living. A wheelchair will allow patient to safely perform daily activities. Patient is not able to propel themselves in the home using a standard weight wheelchair due to weakness:20653. Patient can self propel in the lightweight wheelchair. Length of need Timeframe:22503. Accessories: elevating leg rests (ELRs), wheel locks, extensions and anti-tippers.   03/15/20 1644   03/13/20 0916  For home use only DME 3 n 1  Once     03/13/20 0915   03/13/20 0914  For home use only DME lightweight manual wheelchair with seat cushion  Once    Comments: Patient suffers from right transtibial amputation which impairs their ability to perform daily activities like ADLs in the home.  A walker will not resolve  issue with performing activities of daily living. A wheelchair will allow patient to safely perform daily activities. Patient is not able to propel themselves in the home using a standard weight wheelchair due to weakness. Patient can self propel in the lightweight wheelchair. Length of need Lifetime. Accessories: elevating leg rests (ELRs), wheel locks, extensions and anti-tippers, back cushion, amputee leg support.   03/13/20 0915          Diagnostic Studies: No results found.  Patient benefited maximally from their hospital stay and there were no complications.     Disposition: Discharge disposition: 01-Home or Self Care      Discharge Instructions     Call MD / Call 911   Complete by: As directed    If you experience chest pain or shortness of breath, CALL 911 and be transported to the hospital emergency room.  If you develope a fever above 101 F, pus (white drainage) or increased drainage or redness at the wound, or calf pain, call your surgeon's office.   Constipation Prevention   Complete by: As directed    Drink plenty of fluids.  Prune juice may be helpful.  You may use a stool softener, such as Colace (over the counter) 100 mg twice a day.  Use MiraLax (over the counter) for constipation as needed.   Diet - low sodium heart healthy   Complete by: As  directed    Increase activity slowly as tolerated   Complete by: As directed    Negative Pressure Wound Therapy - Incisional   Complete by: As directed    Show patient how to attach [place], Erin R, NP In 1 week.   Specialty: Orthopedic Surgery Contact information: [address] [place] [address] [phone]            Signed: Bevely Palmer Ahliya Glatt 03/17/2020, 7:31 AM

## 2020-03-17 NOTE — Care Management Important Message (Signed)
Important Message  Patient Details  Name: Alexander [name] MRN: [id number] Date of Birth: [birth year]   Medicare Important Message Given:  Yes     Amarrion Pastorino Montine Circle 03/17/2020, 1:49 PM

## 2020-03-17 NOTE — Progress Notes (Signed)
Patient is doing well this morning discharge was delayed secondary to not having an appropriate sized wheelchair.  He is gotten is now and is prepared to discharge home.  Vital signs stable alert pleasant  Will follow up in 1 week for VAC removal

## 2020-03-17 NOTE — Progress Notes (Signed)
Patient discharged to home by private care.  All personal belongings gathered and taken with patient including DME provided. Discharge instructions given to patient, including all follow up appointments and how to charge and trouble shoot prevena.  Patient denied further questions.

## 2020-03-17 NOTE — Plan of Care (Signed)

## 2020-03-18 ENCOUNTER — Telehealth: Payer: Self-pay | Admitting: Radiology

## 2020-03-18 NOTE — Telephone Encounter (Signed)
Alexander [name], HHRN called from patient's home wanting clarification on wound vac. I advised we would remove wound vac in office at patient's first post op visit and home health would pick up wound care after that. Patient is S/P right BKA 03/11/2020. He has follow up appt scheduled for 03/25/2020.

## 2020-03-18 NOTE — Telephone Encounter (Signed)
Noted  

## 2020-03-21 ENCOUNTER — Encounter (HOSPITAL_COMMUNITY): Payer: Medicare Other

## 2020-03-25 ENCOUNTER — Encounter: Payer: Self-pay | Admitting: Family

## 2020-03-25 ENCOUNTER — Ambulatory Visit (INDEPENDENT_AMBULATORY_CARE_PROVIDER_SITE_OTHER): Payer: Medicare Other | Admitting: Family

## 2020-03-25 ENCOUNTER — Other Ambulatory Visit: Payer: Self-pay

## 2020-03-25 DIAGNOSIS — Z89511 Acquired absence of right leg below knee: Secondary | ICD-10-CM

## 2020-03-25 NOTE — Progress Notes (Signed)
Post-Op Visit Note   Patient: Alexander [name]           Date of Birth: [birth year]           MRN: [id number] Visit Date: 03/25/2020 PCP: [name], MD  Chief Complaint:  Chief Complaint  Patient presents with  . Right Leg - Routine Post Op    HPI:  HPI The patient is a 59 year old gentleman seen today status post right below knee amputation. Wound vac removed today.  Ortho Exam Incision well approximated with staples. No drainage. No erythema no sign of infection.  Visit Diagnoses: No diagnosis found.  Plan: begin daily dial soap cleansing. Dry dressing changes. shrinker around clock follow up in 2 weeks for staple removal.  Follow-Up Instructions: No follow-ups on file.   Imaging: No results found.  Orders:  No orders of the defined types were placed in this encounter.  No orders of the defined types were placed in this encounter.    PMFS History: Patient Active Problem List   Diagnosis Date Noted  . Acute hematogenous osteomyelitis of right foot ([place]) 03/11/2020  . Gangrene of right foot ([place])   . Diabetic foot ulcer associated with type 2 diabetes mellitus ([place]) 02/19/2020  . Heel ulcer ([place]) 11/05/2019  . Atherosclerotic PVD with ulceration ([place]) 09/09/2017  . Essential hypertension, benign 02/07/2015  . Renal insufficiency 02/07/2015  . Post PTCA 11/09/2014  . S/P PTCA (percutaneous transluminal coronary angioplasty) 11/09/2014  . Pacemaker: [employer] DR MRI [id number] Dual chamber pacemaker 10/08/2014 10/08/2014  . Encounter for care of pacemaker 10/08/2014  . Sinus node dysfunction ([place]) 10/08/2014  . Cardiac asystole ([place]) 10/07/2014  . Cardiac syncope 10/07/2014  . Diabetes mellitus with stage 3 chronic kidney disease ([place]) 10/07/2014  . CAD (coronary artery disease), native coronary artery 10/07/2014  . Diabetes mellitus due to underlying condition without complications ([place]) 41/96/2229  . Anemia, unspecified 05/25/2014  . Syncope  04/30/2014  . Atherosclerosis of native arteries of the extremities with gangrene ([place]) 12/16/2013  . Volume overload 10/30/2013  . Acute renal failure ([place]) 10/28/2013  . PAD (peripheral artery disease) ([place]) 10/27/2013  . Leukocytosis 10/21/2013  . Anemia 10/21/2013  . Osteomyelitis of left foot ([place]) 08/19/2013  . Spinal stenosis, lumbar region, with neurogenic claudication 06/12/2012    Class: Diagnosis of  . DIABETES MELLITUS, TYPE II 03/15/2009  . ALCOHOL ABUSE 03/15/2009  . TOBACCO USE 03/15/2009  . HYPERTENSION 03/15/2009   Past Medical History:  Diagnosis Date  . Alcohol abuse   . Anemia   . Arthritis    "patient does not think so."  . Atrial fibrillation ([place])   . Cardiac syncope 10/07/14   rapid A fib with 8 sec pauses on converison with syncope- temp pacing wire placed then PPM  . Cataract    BILATERAL  . Chronic kidney disease   . Coronary artery disease   . Diabetes mellitus    dx---been  awhile  . Encounter for care of pacemaker 10/08/2014  . History of blood transfusion   . Hyperlipemia   . Hypertension   . Osteomyelitis ([place])    right foot  . Peripheral vascular disease ([place])   . Presence of permanent cardiac pacemaker 10/08/2014  . Sinus node dysfunction ([place]) 10/08/2014    Family History  Problem Relation Age of Onset  . Diabetes type II Mother   . Hypertension Mother   . Diabetes Mother   . Liver cancer Father   .  Diabetes type II Sister   . Breast cancer Sister   . Diabetes Sister   . Hypertension Sister   . Diabetes type II Brother   . Kidney failure Brother   . Diabetes Brother   . Hypertension Brother   . Diabetes type II Sister     Past Surgical History:  Procedure Laterality Date  . ABDOMINAL AORTOGRAM W/LOWER EXTREMITY N/A 09/09/2017   Procedure: ABDOMINAL AORTOGRAM W/LOWER EXTREMITY;  Surgeon: Angelia Mould, MD;  Location: [place] [facility];  Service: Cardiovascular;  Laterality: N/A;  . ABDOMINAL AORTOGRAM W/LOWER  EXTREMITY Right 11/11/2019   Procedure: ABDOMINAL AORTOGRAM W/LOWER EXTREMITY;  Surgeon: Marty Heck, MD;  Location: [place] [facility];  Service: Cardiovascular;  Laterality: Right;  . AMPUTATION Left 08/19/2013   Procedure: AMPUTATION RAY;  Surgeon: Alta Corning, MD;  Location: [place];  Service: Orthopedics;  Laterality: Left;  ray amputation left 5th  . AMPUTATION Left 10/27/2013   Procedure: AMPUTATION DIGIT-LEFT 4TH TOE, 4th and 5th metatarsal.;  Surgeon: Angelia Mould, MD;  Location: [place];  Service: Vascular;  Laterality: Left;  . AMPUTATION Left 11/04/2013   Procedure: LEFT FOOT TRANS-METATARSAL AMPUTATION WITH WOUND CLOSURE ;  Surgeon: Alta Corning, MD;  Location: [place];  Service: Orthopedics;  Laterality: Left;  . AMPUTATION Right 09/10/2017   Procedure: RIGHT FOURTH AND FIFTH TOE AMPUTATION;  Surgeon: Angelia Mould, MD;  Location: [place];  Service: Vascular;  Laterality: Right;  . AMPUTATION Right 02/19/2020   Procedure: AMPUTATION RIGHT 3RD TOE;  Surgeon: Angelia Mould, MD;  Location: [place];  Service: Vascular;  Laterality: Right;  . AMPUTATION Right 03/11/2020   Procedure: RIGHT BELOW KNEE AMPUTATION;  Surgeon: Newt Minion, MD;  Location: [place];  Service: Orthopedics;  Laterality: Right;  . APPLICATION OF WOUND VAC Right 02/19/2020   Procedure: Application Of Wound Vac Right foot;  Surgeon: Angelia Mould, MD;  Location: [place];  Service: Vascular;  Laterality: Right;  . CARDIAC CATHETERIZATION  10/07/2014   Procedure: LEFT HEART CATH AND CORONARY ANGIOGRAPHY;  Surgeon: Laverda Page, MD;  Location: [facility] CATH LAB;  Service: Cardiovascular;;  . COLONOSCOPY W/ POLYPECTOMY    . EMBOLECTOMY Right 09/12/2017   Procedure: Thrombectomy  & Redo Right Below Knee Popliteal Artery Bypass Graft  ;  Surgeon: Waynetta Sandy, MD;  Location: [place];  Service: Vascular;  Laterality: Right;  . EYE SURGERY Bilateral    cataract  . FEMORAL-TIBIAL  BYPASS GRAFT Left 10/27/2013   Procedure: BYPASS GRAFT LEFT FEMORAL- POSTERIOR TIBIAL ARTERY;  Surgeon: Angelia Mould, MD;  Location: [place];  Service: Vascular;  Laterality: Left;  . FEMORAL-TIBIAL BYPASS GRAFT Right 09/10/2017   Procedure: RIGHT SUPERFICIAL  FEMORAL ARTERY-BELOW KNEE POPLITEAL ARTERY BYPASS GRAFT WITH VEIN;  Surgeon: Angelia Mould, MD;  Location: [place];  Service: Vascular;  Laterality: Right;  . I & D EXTREMITY Left 10/27/2013   Procedure: IRRIGATION AND DEBRIDEMENT EXTREMITY- LEFT FOOT;  Surgeon: Angelia Mould, MD;  Location: [place];  Service: Vascular;  Laterality: Left;  . I & D EXTREMITY Right 01/26/2020   Procedure: IRRIGATION AND DEBRIDEMENT EXTREMITY RIGHT FOOT WOUND;  Surgeon: Angelia Mould, MD;  Location: [place];  Service: Vascular;  Laterality: Right;  . INTRAOPERATIVE ARTERIOGRAM Right 09/10/2017   Procedure: INTRA OPERATIVE ARTERIOGRAM;  Surgeon: Angelia Mould, MD;  Location: [place];  Service: Vascular;  Laterality: Right;  . IR ANGIOGRAM FOLLOW UP STUDY  09/09/2017  .  LEFT HEART CATHETERIZATION WITH CORONARY ANGIOGRAM N/A 11/09/2014   Procedure: LEFT HEART CATHETERIZATION WITH CORONARY ANGIOGRAM;  Surgeon: Laverda Page, MD;  Location: [facility] LAB;  Service: Cardiovascular;  Laterality: N/A;  . LOWER EXTREMITY ANGIOGRAM Right 11/08/2015   Procedure: Lower Extremity Angiogram;  Surgeon: Serafina Mitchell, MD;  Location: [place] [facility];  Service: Cardiovascular;  Laterality: Right;  . LUMBAR LAMINECTOMY  06/13/2012   Procedure: MICRODISCECTOMY LUMBAR LAMINECTOMY;  Surgeon: Jessy Oto, MD;  Location: [place];  Service: Orthopedics;  Laterality: N/A;  Central laminectomy L2-3, L3-4, L4-5  . PACEMAKER INSERTION  10/08/14   MDT Advisa MRI compatible dual chamber pacemaker implanted by Dr Caryl Comes for syncope with post-termination pauses  . PERCUTANEOUS CORONARY STENT INTERVENTION (PCI-S)  11/09/2014   des to lad & distal circumflex          Dr  [name]  . PERIPHERAL VASCULAR BALLOON ANGIOPLASTY Right 11/11/2019   Procedure: PERIPHERAL VASCULAR BALLOON ANGIOPLASTY;  Surgeon: Marty Heck, MD;  Location: [place] [facility];  Service: Cardiovascular;  Laterality: Right;  below knee popliteal, tibioperoneal trunk, posterior tibial arteries  . PERIPHERAL VASCULAR CATHETERIZATION N/A 11/08/2015   Procedure: Abdominal Aortogram;  Surgeon: Serafina Mitchell, MD;  Location: [place] [facility];  Service: Cardiovascular;  Laterality: N/A;  . PERIPHERAL VASCULAR CATHETERIZATION Right 11/08/2015   Procedure: Peripheral Vascular Atherectomy;  Surgeon: Serafina Mitchell, MD;  Location: [place] [facility];  Service: Cardiovascular;  Laterality: Right;  . PERIPHERAL VASCULAR CATHETERIZATION N/A 10/10/2016   Procedure: Lower Extremity Angiography;  Surgeon: Waynetta Sandy, MD;  Location: [place] [facility];  Service: Cardiovascular;  Laterality: N/A;  . PERIPHERAL VASCULAR CATHETERIZATION Right 10/10/2016   Procedure: Peripheral Vascular Atherectomy;  Surgeon: Waynetta Sandy, MD;  Location: [place] [facility];  Service: Cardiovascular;  Laterality: Right;  Popliteal  . PERMANENT PACEMAKER INSERTION N/A 10/08/2014   Procedure: PERMANENT PACEMAKER INSERTION;  Surgeon: Deboraha Sprang, MD;  Location: [facility] CATH LAB;  Service: Cardiovascular;  Laterality: N/A;  . TEMPORARY PACEMAKER INSERTION Bilateral 10/07/2014   Procedure: TEMPORARY PACEMAKER INSERTION;  Surgeon: Laverda Page, MD;  Location: [facility] CATH LAB;  Service: Cardiovascular;  Laterality: Bilateral;  . WOUND DEBRIDEMENT Right 02/19/2020   Procedure: DEBRIDEMENT WOUND RIGHT FOOT;  Surgeon: Angelia Mould, MD;  Location: [facility] OR;  Service: Vascular;  Laterality: Right;   Social History   Occupational History  . Occupation: Disabled  Tobacco Use  . Smoking status: Former Smoker    Packs/day: 1.50    Years: 30.00    Pack years: 45.00    Types: Cigars    Quit  date: 10/07/2014    Years since quitting: 5.4  . Smokeless tobacco: Never Used  Substance and Sexual Activity  . Alcohol use: Yes    Alcohol/week: 6.0 standard drinks    Types: 6 Cans of beer per week    Comment: 2-3 beers daily  . Drug use: No  . Sexual activity: Not on file

## 2020-03-28 ENCOUNTER — Telehealth: Payer: Self-pay

## 2020-03-28 NOTE — Telephone Encounter (Signed)
Normal renal arteries by angiogram 11/11/2019. Cancel the test

## 2020-03-31 ENCOUNTER — Ambulatory Visit (HOSPITAL_COMMUNITY)
Admission: RE | Admit: 2020-03-31 | Discharge: 2020-03-31 | Disposition: A | Discharge status: Home / Self Care | Payer: Medicare Other | Source: Ambulatory Visit | Attending: Nephrology | Admitting: Nephrology

## 2020-03-31 VITALS — BP 94/49 | HR 64

## 2020-03-31 DIAGNOSIS — E1122 Type 2 diabetes mellitus with diabetic chronic kidney disease: Secondary | ICD-10-CM | POA: Insufficient documentation

## 2020-03-31 DIAGNOSIS — N183 Chronic kidney disease, stage 3 unspecified: Secondary | ICD-10-CM

## 2020-03-31 LAB — IRON AND TIBC
Iron: 73 ug/dL (ref 45–182)
Saturation Ratios: 35 % (ref 17.9–39.5)
TIBC: 207 ug/dL — ABNORMAL LOW (ref 250–450)
UIBC: 134 ug/dL

## 2020-03-31 LAB — POCT HEMOGLOBIN-HEMACUE: Hemoglobin: 8 g/dL — ABNORMAL LOW (ref 13.0–17.0)

## 2020-03-31 LAB — FERRITIN: Ferritin: 561 ng/mL — ABNORMAL HIGH (ref 24–336)

## 2020-03-31 MED ORDER — DARBEPOETIN ALFA 200 MCG/0.4ML IJ SOSY
PREFILLED_SYRINGE | INTRAMUSCULAR | Status: AC
  Administered 2020-03-31: 200 ug
  Filled 2020-03-31: qty 0.4

## 2020-03-31 MED ORDER — DARBEPOETIN ALFA 200 MCG/0.4ML IJ SOSY: 200.0000 ug | PREFILLED_SYRINGE | INTRAMUSCULAR | Status: DC

## 2020-04-07 ENCOUNTER — Ambulatory Visit (HOSPITAL_COMMUNITY)
Admission: RE | Admit: 2020-04-07 | Discharge: 2020-04-07 | Disposition: A | Discharge status: Home / Self Care | Payer: Medicare Other | Source: Ambulatory Visit | Attending: Nephrology | Admitting: Nephrology

## 2020-04-07 ENCOUNTER — Other Ambulatory Visit: Payer: Self-pay

## 2020-04-07 VITALS — BP 147/62 | HR 62 | Temp 96.5°F | Resp 18

## 2020-04-07 DIAGNOSIS — E1122 Type 2 diabetes mellitus with diabetic chronic kidney disease: Secondary | ICD-10-CM | POA: Insufficient documentation

## 2020-04-07 DIAGNOSIS — N183 Chronic kidney disease, stage 3 unspecified: Secondary | ICD-10-CM | POA: Diagnosis not present

## 2020-04-07 LAB — POCT HEMOGLOBIN-HEMACUE: Hemoglobin: 8.1 g/dL — ABNORMAL LOW (ref 13.0–17.0)

## 2020-04-07 MED ORDER — DARBEPOETIN ALFA 200 MCG/0.4ML IJ SOSY
PREFILLED_SYRINGE | INTRAMUSCULAR | Status: AC
  Administered 2020-04-07: 200 ug
  Filled 2020-04-07: qty 0.4

## 2020-04-07 MED ORDER — DARBEPOETIN ALFA 200 MCG/0.4ML IJ SOSY: 200.0000 ug | PREFILLED_SYRINGE | INTRAMUSCULAR | Status: DC

## 2020-04-08 ENCOUNTER — Ambulatory Visit: Payer: Medicare Other | Admitting: Family

## 2020-04-12 ENCOUNTER — Ambulatory Visit (INDEPENDENT_AMBULATORY_CARE_PROVIDER_SITE_OTHER): Payer: Medicare Other | Admitting: Orthopedic Surgery

## 2020-04-12 ENCOUNTER — Other Ambulatory Visit: Payer: Self-pay

## 2020-04-12 ENCOUNTER — Other Ambulatory Visit: Payer: Medicare Other

## 2020-04-12 ENCOUNTER — Encounter: Payer: Self-pay | Admitting: Family

## 2020-04-12 VITALS — Ht 72.0 in | Wt 270.0 lb

## 2020-04-12 DIAGNOSIS — Z89432 Acquired absence of left foot: Secondary | ICD-10-CM

## 2020-04-12 DIAGNOSIS — Z89511 Acquired absence of right leg below knee: Secondary | ICD-10-CM

## 2020-04-12 NOTE — Progress Notes (Signed)
Office Visit Note   Patient: Alexander [name]           Date of Birth: [birth year]           MRN: [id number] Visit Date: 04/12/2020              Requested by: [name], MD [address] [place],  [place] [address] PCP: [name] [name], MD  Chief Complaint  Patient presents with  . Right Leg - Routine Post Op    03/11/20 right BKA       HPI: Patient is a 59 year old gentleman who presents status post right transtibial amputation he is about 4 weeks out patient feels like he is doing well no complaints no drainage he is also status post a transmetatarsal amputation on the left.  Assessment & Plan: Visit Diagnoses:  1. Right below-knee amputee ([place])   2. S/P transmetatarsal amputation of foot, left ([place])     Plan: Patient is given a prescription to follow-up with [facility] for extra-depth shoe custom orthotics spacer and double upright brace for the left transmetatarsal amputation and a prescription for a right transtibial amputation prosthesis.  Follow-Up Instructions: Return in about 3 weeks (around 05/03/2020).   Ortho Exam  Patient is alert, oriented, no adenopathy, well-dressed, normal affect, normal respiratory effort. Examination the left foot is well-healed with no complicating issues with a transmetatarsal amputation the left right transtibial amputation has healed well there is no cellulitis no drainage no signs of infection we will harvest the staples today patient was given instructions to wear the stump shrinker daily.  Imaging: No results found. No images are attached to the encounter.  Labs: Lab Results  Component Value Date   HGBA1C 8.6 (H) 01/27/2020   HGBA1C 6.5 (H) 09/10/2017   HGBA1C 7.80 02/07/2015   REPTSTATUS 02/01/2020 FINAL 01/26/2020   GRAMSTAIN  01/26/2020    RARE WBC PRESENT,BOTH PMN AND MONONUCLEAR FEW GRAM POSITIVE COCCI IN PAIRS    CULT  01/26/2020    FEW DIPHTHEROIDS(CORYNEBACTERIUM SPECIES) FEW STREPTOCOCCUS  MITIS/ORALIS Standardized susceptibility testing for this organism is not available. FOR DIPHTHEROIDS  (CORYNBACTERIUM SPECIES) NO ANAEROBES ISOLATED Performed at [facility], [place] [address] [place], [place] [address]    LABORGA STREPTOCOCCUS MITIS/ORALIS 01/26/2020     Lab Results  Component Value Date   ALBUMIN 2.9 (L) 02/15/2020   ALBUMIN 2.9 (L) 01/26/2020   ALBUMIN 3.1 (L) 09/09/2017    Lab Results  Component Value Date   MG 2.0 10/07/2014   MG 1.9 05/01/2014   MG 2.1 10/21/2013   No results found for: VD25OH  No results found for: PREALBUMIN CBC EXTENDED Latest Ref Rng & Units 04/07/2020 03/31/2020 03/13/2020  WBC 4.0 - 10.5 K/uL - - -  RBC 4.22 - 5.81 MIL/uL - - -  HGB 13.0 - 17.0 g/dL 8.1(L) 8.0(L) 8.1(L)  HCT 39.0 - 52.0 % - - 25.2(L)  PLT 150 - 400 K/uL - - -  NEUTROABS 1.7 - 7.7 K/uL - - -  LYMPHSABS 0.7 - 4.0 K/uL - - -     Body mass index is 36.62 kg/m.  Orders:  No orders of the defined types were placed in this encounter.  No orders of the defined types were placed in this encounter.    Procedures: No procedures performed  Clinical Data: No additional findings.  ROS:  All other systems negative, except as noted in the HPI. Review of Systems  Objective: Vital Signs: Ht 6' (1.829 m)  Wt 270 lb (122.5 kg)   BMI 36.62 kg/m   Specialty Comments:  No specialty comments available.  PMFS History: Patient Active Problem List   Diagnosis Date Noted  . Acute hematogenous osteomyelitis of right foot ([place]) 03/11/2020  . Gangrene of right foot ([place])   . Diabetic foot ulcer associated with type 2 diabetes mellitus ([place]) 02/19/2020  . Heel ulcer ([place]) 11/05/2019  . Atherosclerotic PVD with ulceration ([place]) 09/09/2017  . Essential hypertension, benign 02/07/2015  . Renal insufficiency 02/07/2015  . Post PTCA 11/09/2014  . S/P PTCA (percutaneous transluminal coronary angioplasty) 11/09/2014  . Pacemaker: Micro DR MRI [id number]  Dual chamber pacemaker 10/08/2014 10/08/2014  . Encounter for care of pacemaker 10/08/2014  . Sinus node dysfunction ([place]) 10/08/2014  . Cardiac asystole ([place]) 10/07/2014  . Cardiac syncope 10/07/2014  . Diabetes mellitus with stage 3 chronic kidney disease ([place]) 10/07/2014  . CAD (coronary artery disease), native coronary artery 10/07/2014  . Diabetes mellitus due to underlying condition without complications ([place]) 32/44/0102  . Anemia, unspecified 05/25/2014  . Syncope 04/30/2014  . Atherosclerosis of native arteries of the extremities with gangrene ([place]) 12/16/2013  . Volume overload 10/30/2013  . Acute renal failure ([place]) 10/28/2013  . PAD (peripheral artery disease) ([place]) 10/27/2013  . Leukocytosis 10/21/2013  . Anemia 10/21/2013  . Osteomyelitis of left foot ([place]) 08/19/2013  . Spinal stenosis, lumbar region, with neurogenic claudication 06/12/2012    Class: Diagnosis of  . DIABETES MELLITUS, TYPE II 03/15/2009  . ALCOHOL ABUSE 03/15/2009  . TOBACCO USE 03/15/2009  . HYPERTENSION 03/15/2009   Past Medical History:  Diagnosis Date  . Alcohol abuse   . Anemia   . Arthritis    "patient does not think so."  . Atrial fibrillation ([place])   . Cardiac syncope 10/07/14   rapid A fib with 8 sec pauses on converison with syncope- temp pacing wire placed then PPM  . Cataract    BILATERAL  . Chronic kidney disease   . Coronary artery disease   . Diabetes mellitus    dx---been  awhile  . Encounter for care of pacemaker 10/08/2014  . History of blood transfusion   . Hyperlipemia   . Hypertension   . Osteomyelitis ([place])    right foot  . Peripheral vascular disease ([place])   . Presence of permanent cardiac pacemaker 10/08/2014  . Sinus node dysfunction ([place]) 10/08/2014    Family History  Problem Relation Age of Onset  . Diabetes type II Mother   . Hypertension Mother   . Diabetes Mother   . Liver cancer Father   . Diabetes type II Sister   . Breast cancer Sister   .  Diabetes Sister   . Hypertension Sister   . Diabetes type II Brother   . Kidney failure Brother   . Diabetes Brother   . Hypertension Brother   . Diabetes type II Sister     Past Surgical History:  Procedure Laterality Date  . ABDOMINAL AORTOGRAM W/LOWER EXTREMITY N/A 09/09/2017   Procedure: ABDOMINAL AORTOGRAM W/LOWER EXTREMITY;  Surgeon: Angelia Mould, MD;  Location: [place] [facility];  Service: Cardiovascular;  Laterality: N/A;  . ABDOMINAL AORTOGRAM W/LOWER EXTREMITY Right 11/11/2019   Procedure: ABDOMINAL AORTOGRAM W/LOWER EXTREMITY;  Surgeon: Marty Heck, MD;  Location: [place] [facility];  Service: Cardiovascular;  Laterality: Right;  . AMPUTATION Left 08/19/2013   Procedure: AMPUTATION RAY;  Surgeon: Alta Corning, MD;  Location: [place];  Service: Orthopedics;  Laterality:  Left;  ray amputation left 5th  . AMPUTATION Left 10/27/2013   Procedure: AMPUTATION DIGIT-LEFT 4TH TOE, 4th and 5th metatarsal.;  Surgeon: Angelia Mould, MD;  Location: [place];  Service: Vascular;  Laterality: Left;  . AMPUTATION Left 11/04/2013   Procedure: LEFT FOOT TRANS-METATARSAL AMPUTATION WITH WOUND CLOSURE ;  Surgeon: Alta Corning, MD;  Location: [place];  Service: Orthopedics;  Laterality: Left;  . AMPUTATION Right 09/10/2017   Procedure: RIGHT FOURTH AND FIFTH TOE AMPUTATION;  Surgeon: Angelia Mould, MD;  Location: [place];  Service: Vascular;  Laterality: Right;  . AMPUTATION Right 02/19/2020   Procedure: AMPUTATION RIGHT 3RD TOE;  Surgeon: Angelia Mould, MD;  Location: [place];  Service: Vascular;  Laterality: Right;  . AMPUTATION Right 03/11/2020   Procedure: RIGHT BELOW KNEE AMPUTATION;  Surgeon: Newt Minion, MD;  Location: [place];  Service: Orthopedics;  Laterality: Right;  . APPLICATION OF WOUND VAC Right 02/19/2020   Procedure: Application Of Wound Vac Right foot;  Surgeon: Angelia Mould, MD;  Location: [place];  Service: Vascular;  Laterality: Right;  .  CARDIAC CATHETERIZATION  10/07/2014   Procedure: LEFT HEART CATH AND CORONARY ANGIOGRAPHY;  Surgeon: Laverda Page, MD;  Location: [facility] CATH LAB;  Service: Cardiovascular;;  . COLONOSCOPY W/ POLYPECTOMY    . EMBOLECTOMY Right 09/12/2017   Procedure: Thrombectomy  & Redo Right Below Knee Popliteal Artery Bypass Graft  ;  Surgeon: Waynetta Sandy, MD;  Location: [place];  Service: Vascular;  Laterality: Right;  . EYE SURGERY Bilateral    cataract  . FEMORAL-TIBIAL BYPASS GRAFT Left 10/27/2013   Procedure: BYPASS GRAFT LEFT FEMORAL- POSTERIOR TIBIAL ARTERY;  Surgeon: Angelia Mould, MD;  Location: [place];  Service: Vascular;  Laterality: Left;  . FEMORAL-TIBIAL BYPASS GRAFT Right 09/10/2017   Procedure: RIGHT SUPERFICIAL  FEMORAL ARTERY-BELOW KNEE POPLITEAL ARTERY BYPASS GRAFT WITH VEIN;  Surgeon: Angelia Mould, MD;  Location: [place];  Service: Vascular;  Laterality: Right;  . I & D EXTREMITY Left 10/27/2013   Procedure: IRRIGATION AND DEBRIDEMENT EXTREMITY- LEFT FOOT;  Surgeon: Angelia Mould, MD;  Location: [place];  Service: Vascular;  Laterality: Left;  . I & D EXTREMITY Right 01/26/2020   Procedure: IRRIGATION AND DEBRIDEMENT EXTREMITY RIGHT FOOT WOUND;  Surgeon: Angelia Mould, MD;  Location: [place];  Service: Vascular;  Laterality: Right;  . INTRAOPERATIVE ARTERIOGRAM Right 09/10/2017   Procedure: INTRA OPERATIVE ARTERIOGRAM;  Surgeon: Angelia Mould, MD;  Location: [place];  Service: Vascular;  Laterality: Right;  . IR ANGIOGRAM FOLLOW UP STUDY  09/09/2017  . LEFT HEART CATHETERIZATION WITH CORONARY ANGIOGRAM N/A 11/09/2014   Procedure: LEFT HEART CATHETERIZATION WITH CORONARY ANGIOGRAM;  Surgeon: Laverda Page, MD;  Location: [facility] CATH LAB;  Service: Cardiovascular;  Laterality: N/A;  . LOWER EXTREMITY ANGIOGRAM Right 11/08/2015   Procedure: Lower Extremity Angiogram;  Surgeon: Serafina Mitchell, MD;  Location: [place] [facility];  Service:  Cardiovascular;  Laterality: Right;  . LUMBAR LAMINECTOMY  06/13/2012   Procedure: MICRODISCECTOMY LUMBAR LAMINECTOMY;  Surgeon: Jessy Oto, MD;  Location: [place];  Service: Orthopedics;  Laterality: N/A;  Central laminectomy L2-3, L3-4, L4-5  . PACEMAKER INSERTION  10/08/14   MDT [employer]visa MRI compatible dual chamber pacemaker implanted by Dr Caryl Comes for syncope with post-termination pauses  . PERCUTANEOUS CORONARY STENT INTERVENTION (PCI-S)  11/09/2014   des to lad & distal circumflex         Dr  Ganji  . PERIPHERAL VASCULAR BALLOON ANGIOPLASTY Right 11/11/2019   Procedure: PERIPHERAL VASCULAR BALLOON ANGIOPLASTY;  Surgeon: Marty Heck, MD;  Location: [place] [facility];  Service: Cardiovascular;  Laterality: Right;  below knee popliteal, tibioperoneal trunk, posterior tibial arteries  . PERIPHERAL VASCULAR CATHETERIZATION N/A 11/08/2015   Procedure: Abdominal Aortogram;  Surgeon: Serafina Mitchell, MD;  Location: [place] [facility] LAB;  Service: Cardiovascular;  Laterality: N/A;  . PERIPHERAL VASCULAR CATHETERIZATION Right 11/08/2015   Procedure: Peripheral Vascular Atherectomy;  Surgeon: Serafina Mitchell, MD;  Location: [place] [facility];  Service: Cardiovascular;  Laterality: Right;  . PERIPHERAL VASCULAR CATHETERIZATION N/A 10/10/2016   Procedure: Lower Extremity Angiography;  Surgeon: Waynetta Sandy, MD;  Location: [place] [facility];  Service: Cardiovascular;  Laterality: N/A;  . PERIPHERAL VASCULAR CATHETERIZATION Right 10/10/2016   Procedure: Peripheral Vascular Atherectomy;  Surgeon: Waynetta Sandy, MD;  Location: [place] [facility];  Service: Cardiovascular;  Laterality: Right;  Popliteal  . PERMANENT PACEMAKER INSERTION N/A 10/08/2014   Procedure: PERMANENT PACEMAKER INSERTION;  Surgeon: Deboraha Sprang, MD;  Location: [facility] CATH LAB;  Service: Cardiovascular;  Laterality: N/A;  . TEMPORARY PACEMAKER INSERTION Bilateral 10/07/2014   Procedure: TEMPORARY PACEMAKER  INSERTION;  Surgeon: Laverda Page, MD;  Location: [facility] CATH LAB;  Service: Cardiovascular;  Laterality: Bilateral;  . WOUND DEBRIDEMENT Right 02/19/2020   Procedure: DEBRIDEMENT WOUND RIGHT FOOT;  Surgeon: Angelia Mould, MD;  Location: [facility] OR;  Service: Vascular;  Laterality: Right;   Social History   Occupational History  . Occupation: Disabled  Tobacco Use  . Smoking status: Former Smoker    Packs/day: 1.50    Years: 30.00    Pack years: 45.00    Types: Cigars    Quit date: 10/07/2014    Years since quitting: 5.5  . Smokeless tobacco: Never Used  Substance and Sexual Activity  . Alcohol use: Yes    Alcohol/week: 6.0 standard drinks    Types: 6 Cans of beer per week    Comment: 2-3 beers daily  . Drug use: No  . Sexual activity: Not on file

## 2020-04-14 ENCOUNTER — Encounter (HOSPITAL_COMMUNITY)
Admission: RE | Admit: 2020-04-14 | Discharge: 2020-04-14 | Disposition: A | Discharge status: Home / Self Care | Payer: Medicare Other | Source: Ambulatory Visit | Attending: Nephrology | Admitting: Nephrology

## 2020-04-14 ENCOUNTER — Other Ambulatory Visit: Payer: Self-pay

## 2020-04-14 VITALS — BP 108/46 | HR 64 | Temp 97.2°F | Resp 18

## 2020-04-14 DIAGNOSIS — N183 Chronic kidney disease, stage 3 unspecified: Secondary | ICD-10-CM | POA: Diagnosis not present

## 2020-04-14 DIAGNOSIS — Z89511 Acquired absence of right leg below knee: Secondary | ICD-10-CM | POA: Diagnosis not present

## 2020-04-14 DIAGNOSIS — E1122 Type 2 diabetes mellitus with diabetic chronic kidney disease: Secondary | ICD-10-CM | POA: Insufficient documentation

## 2020-04-14 DIAGNOSIS — Z95820 Peripheral vascular angioplasty status with implants and grafts: Secondary | ICD-10-CM | POA: Diagnosis not present

## 2020-04-14 DIAGNOSIS — E1151 Type 2 diabetes mellitus with diabetic peripheral angiopathy without gangrene: Secondary | ICD-10-CM | POA: Diagnosis not present

## 2020-04-14 DIAGNOSIS — N184 Chronic kidney disease, stage 4 (severe): Secondary | ICD-10-CM | POA: Diagnosis not present

## 2020-04-14 LAB — POCT HEMOGLOBIN-HEMACUE: Hemoglobin: 9.2 g/dL — ABNORMAL LOW (ref 13.0–17.0)

## 2020-04-14 MED ORDER — DARBEPOETIN ALFA 200 MCG/0.4ML IJ SOSY
200.0000 ug | PREFILLED_SYRINGE | INTRAMUSCULAR | Status: DC
  Administered 2020-04-14: 200 ug via SUBCUTANEOUS

## 2020-04-14 MED ORDER — DARBEPOETIN ALFA 200 MCG/0.4ML IJ SOSY
PREFILLED_SYRINGE | INTRAMUSCULAR | Status: AC
  Filled 2020-04-14: qty 0.4

## 2020-04-18 ENCOUNTER — Telehealth: Payer: Self-pay

## 2020-04-18 NOTE — Telephone Encounter (Signed)
I called and pt and his daughter did not know if he needed to apply his sock first and then the dressing or vice versa. I advised to make sure that the shrinker and sock are making direct contact with the skin and that if he is having any drainage they can apply a dry dressing to the outside to absorb this. Will call with any questions and advised we can move his appt up sooner should he have any concerns about the way things are looking.

## 2020-04-18 NOTE — Telephone Encounter (Signed)
Patient's daughter, [name] called wanting clarification on dressing for patient's right leg.  Cb# [id number].  Please advise.  Thank you.

## 2020-04-21 ENCOUNTER — Other Ambulatory Visit: Payer: Self-pay

## 2020-04-21 ENCOUNTER — Ambulatory Visit (HOSPITAL_COMMUNITY)
Admission: RE | Admit: 2020-04-21 | Discharge: 2020-04-21 | Disposition: A | Discharge status: Home / Self Care | Payer: Medicare Other | Source: Ambulatory Visit | Attending: Nephrology | Admitting: Nephrology

## 2020-04-21 VITALS — BP 131/59 | HR 65 | Temp 97.2°F | Resp 18

## 2020-04-21 DIAGNOSIS — Z950 Presence of cardiac pacemaker: Secondary | ICD-10-CM | POA: Diagnosis not present

## 2020-04-21 DIAGNOSIS — E1122 Type 2 diabetes mellitus with diabetic chronic kidney disease: Secondary | ICD-10-CM | POA: Diagnosis not present

## 2020-04-21 DIAGNOSIS — N183 Chronic kidney disease, stage 3 unspecified: Secondary | ICD-10-CM | POA: Diagnosis not present

## 2020-04-21 DIAGNOSIS — Z45018 Encounter for adjustment and management of other part of cardiac pacemaker: Secondary | ICD-10-CM | POA: Diagnosis not present

## 2020-04-21 DIAGNOSIS — I495 Sick sinus syndrome: Secondary | ICD-10-CM | POA: Diagnosis not present

## 2020-04-21 LAB — POCT HEMOGLOBIN-HEMACUE: Hemoglobin: 10.3 g/dL — ABNORMAL LOW (ref 13.0–17.0)

## 2020-04-21 MED ORDER — DARBEPOETIN ALFA 200 MCG/0.4ML IJ SOSY
200.0000 ug | PREFILLED_SYRINGE | INTRAMUSCULAR | Status: DC
  Administered 2020-04-21: 200 ug via SUBCUTANEOUS

## 2020-04-21 MED ORDER — DARBEPOETIN ALFA 200 MCG/0.4ML IJ SOSY
PREFILLED_SYRINGE | INTRAMUSCULAR | Status: AC
  Filled 2020-04-21: qty 0.4

## 2020-04-22 ENCOUNTER — Ambulatory Visit: Payer: Medicare Other | Admitting: Cardiology

## 2020-04-22 ENCOUNTER — Encounter: Payer: Self-pay | Admitting: Cardiology

## 2020-04-22 ENCOUNTER — Other Ambulatory Visit: Payer: Self-pay

## 2020-04-22 VITALS — BP 129/49 | HR 70 | Ht 72.0 in | Wt 265.0 lb

## 2020-04-22 DIAGNOSIS — Z7289 Other problems related to lifestyle: Secondary | ICD-10-CM

## 2020-04-22 DIAGNOSIS — Z87891 Personal history of nicotine dependence: Secondary | ICD-10-CM

## 2020-04-22 DIAGNOSIS — E1151 Type 2 diabetes mellitus with diabetic peripheral angiopathy without gangrene: Secondary | ICD-10-CM | POA: Diagnosis not present

## 2020-04-22 DIAGNOSIS — Z794 Long term (current) use of insulin: Secondary | ICD-10-CM

## 2020-04-22 DIAGNOSIS — Z89511 Acquired absence of right leg below knee: Secondary | ICD-10-CM

## 2020-04-22 DIAGNOSIS — I2510 Atherosclerotic heart disease of native coronary artery without angina pectoris: Secondary | ICD-10-CM

## 2020-04-22 DIAGNOSIS — E7800 Pure hypercholesterolemia, unspecified: Secondary | ICD-10-CM | POA: Diagnosis not present

## 2020-04-22 DIAGNOSIS — F1090 Alcohol use, unspecified, uncomplicated: Secondary | ICD-10-CM

## 2020-04-22 DIAGNOSIS — I4729 Other ventricular tachycardia: Secondary | ICD-10-CM

## 2020-04-22 DIAGNOSIS — N184 Chronic kidney disease, stage 4 (severe): Secondary | ICD-10-CM

## 2020-04-22 DIAGNOSIS — I10 Essential (primary) hypertension: Secondary | ICD-10-CM

## 2020-04-22 DIAGNOSIS — I70269 Atherosclerosis of native arteries of extremities with gangrene, unspecified extremity: Secondary | ICD-10-CM | POA: Diagnosis not present

## 2020-04-22 DIAGNOSIS — I472 Ventricular tachycardia: Secondary | ICD-10-CM

## 2020-04-22 DIAGNOSIS — Z950 Presence of cardiac pacemaker: Secondary | ICD-10-CM

## 2020-04-22 DIAGNOSIS — I739 Peripheral vascular disease, unspecified: Secondary | ICD-10-CM

## 2020-04-22 DIAGNOSIS — Z7901 Long term (current) use of anticoagulants: Secondary | ICD-10-CM

## 2020-04-22 DIAGNOSIS — Z789 Other specified health status: Secondary | ICD-10-CM

## 2020-04-22 DIAGNOSIS — E1165 Type 2 diabetes mellitus with hyperglycemia: Secondary | ICD-10-CM

## 2020-04-22 DIAGNOSIS — I480 Paroxysmal atrial fibrillation: Secondary | ICD-10-CM

## 2020-04-22 NOTE — Progress Notes (Signed)
Alexander [name] Date of Birth: 09/16/1961 MRN: [id number] Primary Care Provider:Tisovec, [name], MD Former Cardiology Providers: Dr. Adrian Prows, Jeri Lager, APRN, FNP-C Primary Cardiologist: Rex Kras, DO, [facility] (established care 04/22/2020) Electrophysiologist: Dr. Cristopher Peru  Date: 04/22/20 Last Visit: 10/24/2019  Chief Complaint  Patient presents with  . Coronary Artery Disease  . Pacemaker Problem    HPI  Alexander [name] is a 59 y.o.  male who presents to the office with a chief complaint of " 37-monthfollow-up in regards to management of CAD and pacemaker alert."  Patient has a complex past medical history and multiple cardiovascular risk factors which include but not limited to: Paroxysmal atrial fibrillation, on oral anticoagulation, coronary artery disease with prior intervention, peripheral artery disease with prior bypass, status post right BKA, pacemaker implantation secondary to complete heart block (implanted 10/07/2014), former smoker, insulin-dependent diabetes mellitus, increased alcohol use, obesity due to excess calories.  Patient presents to the office accompanied by her daughter KJoellen Jersey  Patient was last seen in the office back in December 2020 by AJeri Lager APRN, FNP-C and now is here to reestablish care with myself 6 months later after pacemaker alert noted nonsustained ventricular tachycardia.  For the last office visit patient has been in the care of his vascular surgeon and orthopedic and multiple hospitalizations which eventually led to right BKA.  Patient is doing well postoperatively and follows up with both his vascular surgeon and orthopedic as recommended.  From cardiovascular standpoint he denies any chest or shortness of breath at rest or with effort related activities.  Patient had a pacemaker transmission alert sent to the office which noted that on  April 21, 2020 he had an episode of nonsustained ventricular tachycardia.  Patient's last ischemic  evaluation was back in 2017.  He has established coronary artery disease coronary artery disease with angioplasty and stenting to the LAD and dominant circumflex in December 2015, diffusely diseased small RCA.   ALLERGIES: Allergies  Allergen Reactions  . Keflex [Cephalexin] Nausea And Vomiting  . Pletal [Cilostazol] Palpitations    "Can hear heart beating loudly".  . Sulfa Antibiotics Rash     MEDICATION LIST PRIOR TO VISIT: Current Outpatient Medications on File Prior to Visit  Medication Sig Dispense Refill  . apixaban (ELIQUIS) 5 MG TABS tablet Take 1 tablet (5 mg total) by mouth 2 (two) times daily. DO NOT TAKE THIS MEDICATION UNTIL WE RESTART IT AFTER YOUR SURGERY.    .Marland Kitchenaspirin 81 MG EC tablet Take 81 mg by mouth daily.     .Marland Kitchenatorvastatin (LIPITOR) 20 MG tablet Take 1 tablet by mouth once daily (Patient taking differently: Take 20 mg by mouth daily. ) 90 tablet 2  . blood glucose meter kit and supplies KIT Check blood sugar TID & QHS 1 each 0  . Blood Glucose Monitoring Suppl (ACCU-CHEK ADVANTAGE DIABETES) kit Use as instructed 1 each 0  . furosemide (LASIX) 40 MG tablet Take 40 mg by mouth 2 (two) times daily with a meal.     . glucose blood (ACCU-CHEK AVIVA PLUS) test strip Use as instructed 100 each 12  . glucose blood (CHOICE DM FORA G20 TEST STRIPS) test strip Check blood sugar TID & QHS 100 each 12  . glucose blood test strip Use as instructed 100 each 12  . hydrALAZINE (APRESOLINE) 100 MG tablet TAKE 1 TABLET BY MOUTH THREE TIMES DAILY (Patient taking differently: Take 100 mg by mouth 2 (two) times daily. ) 270 tablet  0  . insulin NPH-regular Human (70-30) 100 UNIT/ML injection Inject 46-56 Units into the skin daily with breakfast.    . lisinopril (ZESTRIL) 10 MG tablet Take 1 tablet (10 mg total) by mouth daily.    . metoprolol tartrate (LOPRESSOR) 100 MG tablet Take 1 tablet by mouth twice daily (Patient taking differently: Take 100 mg by mouth 2 (two) times daily. ) 180  tablet 0  . Multiple Vitamin (MULTIVITAMIN WITH MINERALS) TABS tablet Take 1 tablet by mouth daily.    Marland Kitchen oxyCODONE (OXY IR/ROXICODONE) 5 MG immediate release tablet Take 1-2 tablets (5-10 mg total) by mouth every 4 (four) hours as needed for moderate pain (pain score 4-6). 30 tablet 0  . prazosin (MINIPRESS) 2 MG capsule TAKE 1 CAPSULE BY MOUTH IN THE EVENING AFTER SUPPER (Patient taking differently: Take 2 mg by mouth daily after supper. ) 90 capsule 0  . ACCU-CHEK FASTCLIX LANCETS MISC Check blood sugar TID & QHS 102 each 2   No current facility-administered medications on file prior to visit.    PAST MEDICAL HISTORY: Past Medical History:  Diagnosis Date  . Alcohol abuse   . Anemia   . Arthritis    "patient does not think so."  . Atrial fibrillation ([place])   . Cardiac syncope 10/07/14   rapid A fib with 8 sec pauses on converison with syncope- temp pacing wire placed then PPM  . Cataract    BILATERAL  . Chronic kidney disease   . Coronary artery disease   . Diabetes mellitus    dx---been  awhile  . Encounter for care of pacemaker 10/08/2014  . History of blood transfusion   . Hyperlipemia   . Hypertension   . Osteomyelitis ([place])    right foot  . Peripheral vascular disease ([place])   . Presence of permanent cardiac pacemaker 10/08/2014  . Sinus node dysfunction ([place]) 10/08/2014    PAST SURGICAL HISTORY: Past Surgical History:  Procedure Laterality Date  . ABDOMINAL AORTOGRAM W/LOWER EXTREMITY N/A 09/09/2017   Procedure: ABDOMINAL AORTOGRAM W/LOWER EXTREMITY;  Surgeon: Angelia Mould, MD;  Location: [place] [facility];  Service: Cardiovascular;  Laterality: N/A;  . ABDOMINAL AORTOGRAM W/LOWER EXTREMITY Right 11/11/2019   Procedure: ABDOMINAL AORTOGRAM W/LOWER EXTREMITY;  Surgeon: Marty Heck, MD;  Location: [place] [facility];  Service: Cardiovascular;  Laterality: Right;  . AMPUTATION Left 08/19/2013   Procedure: AMPUTATION RAY;  Surgeon: Alta Corning, MD;   Location: [place];  Service: Orthopedics;  Laterality: Left;  ray amputation left 5th  . AMPUTATION Left 10/27/2013   Procedure: AMPUTATION DIGIT-LEFT 4TH TOE, 4th and 5th metatarsal.;  Surgeon: Angelia Mould, MD;  Location: [place];  Service: Vascular;  Laterality: Left;  . AMPUTATION Left 11/04/2013   Procedure: LEFT FOOT TRANS-METATARSAL AMPUTATION WITH WOUND CLOSURE ;  Surgeon: Alta Corning, MD;  Location: [place];  Service: Orthopedics;  Laterality: Left;  . AMPUTATION Right 09/10/2017   Procedure: RIGHT FOURTH AND FIFTH TOE AMPUTATION;  Surgeon: Angelia Mould, MD;  Location: [place];  Service: Vascular;  Laterality: Right;  . AMPUTATION Right 02/19/2020   Procedure: AMPUTATION RIGHT 3RD TOE;  Surgeon: Angelia Mould, MD;  Location: [place];  Service: Vascular;  Laterality: Right;  . AMPUTATION Right 03/11/2020   Procedure: RIGHT BELOW KNEE AMPUTATION;  Surgeon: Newt Minion, MD;  Location: [place];  Service: Orthopedics;  Laterality: Right;  . APPLICATION OF WOUND VAC Right 02/19/2020   Procedure: Application Of Wound Vac  Right foot;  Surgeon: Angelia Mould, MD;  Location: [facility] OR;  Service: Vascular;  Laterality: Right;  . CARDIAC CATHETERIZATION  10/07/2014   Procedure: LEFT HEART CATH AND CORONARY ANGIOGRAPHY;  Surgeon: Laverda Page, MD;  Location: [facility] CATH LAB;  Service: Cardiovascular;;  . COLONOSCOPY W/ POLYPECTOMY    . EMBOLECTOMY Right 09/12/2017   Procedure: Thrombectomy  & Redo Right Below Knee Popliteal Artery Bypass Graft  ;  Surgeon: Waynetta Sandy, MD;  Location: [place];  Service: Vascular;  Laterality: Right;  . EYE SURGERY Bilateral    cataract  . FEMORAL-TIBIAL BYPASS GRAFT Left 10/27/2013   Procedure: BYPASS GRAFT LEFT FEMORAL- POSTERIOR TIBIAL ARTERY;  Surgeon: Angelia Mould, MD;  Location: [place];  Service: Vascular;  Laterality: Left;  . FEMORAL-TIBIAL BYPASS GRAFT Right 09/10/2017   Procedure: RIGHT SUPERFICIAL  FEMORAL  ARTERY-BELOW KNEE POPLITEAL ARTERY BYPASS GRAFT WITH VEIN;  Surgeon: Angelia Mould, MD;  Location: [place];  Service: Vascular;  Laterality: Right;  . I & D EXTREMITY Left 10/27/2013   Procedure: IRRIGATION AND DEBRIDEMENT EXTREMITY- LEFT FOOT;  Surgeon: Angelia Mould, MD;  Location: [place];  Service: Vascular;  Laterality: Left;  . I & D EXTREMITY Right 01/26/2020   Procedure: IRRIGATION AND DEBRIDEMENT EXTREMITY RIGHT FOOT WOUND;  Surgeon: Angelia Mould, MD;  Location: [place];  Service: Vascular;  Laterality: Right;  . INTRAOPERATIVE ARTERIOGRAM Right 09/10/2017   Procedure: INTRA OPERATIVE ARTERIOGRAM;  Surgeon: Angelia Mould, MD;  Location: [place];  Service: Vascular;  Laterality: Right;  . IR ANGIOGRAM FOLLOW UP STUDY  09/09/2017  . LEFT HEART CATHETERIZATION WITH CORONARY ANGIOGRAM N/A 11/09/2014   Procedure: LEFT HEART CATHETERIZATION WITH CORONARY ANGIOGRAM;  Surgeon: Laverda Page, MD;  Location: [facility] CATH LAB;  Service: Cardiovascular;  Laterality: N/A;  . LOWER EXTREMITY ANGIOGRAM Right 11/08/2015   Procedure: Lower Extremity Angiogram;  Surgeon: Serafina Mitchell, MD;  Location: [place] [facility];  Service: Cardiovascular;  Laterality: Right;  . LUMBAR LAMINECTOMY  06/13/2012   Procedure: MICRODISCECTOMY LUMBAR LAMINECTOMY;  Surgeon: Jessy Oto, MD;  Location: [place];  Service: Orthopedics;  Laterality: N/A;  Central laminectomy L2-3, L3-4, L4-5  . PACEMAKER INSERTION  10/08/14   MDT Advisa MRI compatible dual chamber pacemaker implanted by Dr Caryl Comes for syncope with post-termination pauses  . PERCUTANEOUS CORONARY STENT INTERVENTION (PCI-S)  11/09/2014   des to lad & distal circumflex         Dr  [name]  . PERIPHERAL VASCULAR BALLOON ANGIOPLASTY Right 11/11/2019   Procedure: PERIPHERAL VASCULAR BALLOON ANGIOPLASTY;  Surgeon: Marty Heck, MD;  Location: [place] [facility];  Service: Cardiovascular;  Laterality: Right;  below knee popliteal,  tibioperoneal trunk, posterior tibial arteries  . PERIPHERAL VASCULAR CATHETERIZATION N/A 11/08/2015   Procedure: Abdominal Aortogram;  Surgeon: Serafina Mitchell, MD;  Location: [place] [facility];  Service: Cardiovascular;  Laterality: N/A;  . PERIPHERAL VASCULAR CATHETERIZATION Right 11/08/2015   Procedure: Peripheral Vascular Atherectomy;  Surgeon: Serafina Mitchell, MD;  Location: [place] [facility];  Service: Cardiovascular;  Laterality: Right;  . PERIPHERAL VASCULAR CATHETERIZATION N/A 10/10/2016   Procedure: Lower Extremity Angiography;  Surgeon: Waynetta Sandy, MD;  Location: [place] [facility];  Service: Cardiovascular;  Laterality: N/A;  . PERIPHERAL VASCULAR CATHETERIZATION Right 10/10/2016   Procedure: Peripheral Vascular Atherectomy;  Surgeon: Waynetta Sandy, MD;  Location: [place] [facility];  Service: Cardiovascular;  Laterality: Right;  Popliteal  . PERMANENT  PACEMAKER INSERTION N/A 10/08/2014   Procedure: PERMANENT PACEMAKER INSERTION;  Surgeon: Deboraha Sprang, MD;  Location: [facility] CATH LAB;  Service: Cardiovascular;  Laterality: N/A;  . TEMPORARY PACEMAKER INSERTION Bilateral 10/07/2014   Procedure: TEMPORARY PACEMAKER INSERTION;  Surgeon: Laverda Page, MD;  [facility] LAB;  Service: Cardiovascular;  Laterality: Bilateral;  . WOUND DEBRIDEMENT Right 02/19/2020   Procedure: DEBRIDEMENT WOUND RIGHT FOOT;  Surgeon: Angelia Mould, MD;  [facility];  Service: Vascular;  Laterality: Right;    FAMILY HISTORY: The patient's family history includes Breast cancer in his sister; Diabetes in his brother, mother, and sister; Diabetes type II in his brother, mother, sister, and sister; Hypertension in his brother, mother, and sister; Kidney failure in his brother; Liver cancer in his father.   SOCIAL HISTORY:  The patient  reports that he quit smoking about 5 years ago. His smoking use included cigars. He has a 45.00 pack-year smoking history. He has  never used smokeless tobacco. He reports current alcohol use of about 6.0 standard drinks of alcohol per week. He reports that he does not use drugs.  Review of Systems  Constitutional: Negative for chills and fever.  HENT: Negative for hoarse voice and nosebleeds.   Eyes: Negative for discharge, double vision and pain.  Cardiovascular: Negative for chest pain, claudication, dyspnea on exertion, leg swelling, near-syncope, orthopnea, palpitations, paroxysmal nocturnal dyspnea and syncope.  Respiratory: Negative for hemoptysis and shortness of breath.   Musculoskeletal: Negative for muscle cramps and myalgias.  Gastrointestinal: Negative for abdominal pain, constipation, diarrhea, hematemesis, hematochezia, melena, nausea and vomiting.  Neurological: Negative for dizziness and light-headedness.    PHYSICAL EXAM: Vitals with BMI 04/22/2020 04/21/2020 04/14/2020  Height 6' 0" - -  Weight 265 lbs - -  BMI 34.74 - -  Systolic 259 563 875  Diastolic 49 59 46  Pulse 70 65 64   CONSTITUTIONAL: Appears older than stated age, well-developed and well-nourished. No acute distress.  SKIN: Skin is warm and dry. No rash noted. No cyanosis. No pallor. No jaundice HEAD: Normocephalic and atraumatic.  EYES: No scleral icterus MOUTH/THROAT: Moist oral membranes.  NECK: No JVD present. No thyromegaly noted. No carotid bruits  LYMPHATIC: No visible cervical adenopathy.  CHEST Normal respiratory effort. No intercostal retractions.  Pacemaker in the left infraclavicular region site is clean dry and intact LUNGS: Clear to auscultation bilaterally.  No stridor. No wheezes. No rales.  CARDIOVASCULAR: Regular, positive S1-S2, no murmurs rubs or gallops appreciated ABDOMINAL: Soft, nontender, nondistended, positive bowel sounds in all 4 quadrants, no apparent ascites.  EXTREMITIES: Right BKA, +1 pitting edema in the left lower extremity, difficult to palpate dorsalis pedis posterior tibial pulses on the left side,  prior surgical site well-healed. HEMATOLOGIC: No significant bruising NEUROLOGIC: Oriented to person, place, and time. Nonfocal. Normal muscle tone.  PSYCHIATRIC: Normal mood and affect. Normal behavior. Cooperative  CARDIAC DATABASE: Pacemaker in situ: S/P Medtronic advica DR (MRI compatible) dual-chamber pacemaker by Dr. Caryl Comes on 10/07/2014 06/010/2021: 12 Sec NSVT. One AHR episode consistent with AF for 00:08:15:50. There was 0.3% cumulative atrial arrhythmia burden. Normal function. RA paced 36 and RV paced <0.1%.  EKG: 10/23/2019: Sinus rhythm at 82 bpm with first degree AV block, left axis deviation, left anterior fascicular block. Right bundle branch block.   04/22/2020: Normal sinus rhythm, 72 bpm, left axis deviation, left anterior fascicular block, right bundle branch block, poor R wave progression.  No significant change compared to prior ECG dated 10/23/2019.  Echocardiogram: 03/15/2016: Left ventricle cavity is normal in size. Severe concentric hypertrophy of the left ventricle. Normal global wall motion. Normal diastolic filling pattern. Calculated EF 66%. Left atrial cavity is mildly dilated. Right ventricle cavity is normal in size. Normal right ventricular function. Pacemaker lead/ICD lead noted in the RV. Trace mitral regurgitation. Trace pulmonic regurgitation.  Stress Testing:  Lexiscan myoview stress test 01/27/2016: 1. The resting electrocardiogram demonstrated normal sinus rhythm, normal resting conduction, no resting arrhythmias and normal rest repolarization. Stress EKG is non-diagnostic for ischemia as it a pharmacologic stress using Lexiscan. Stress symptoms included dyspnea. 2. The perfusion imaging study demonstrates a moderate-sized defect in the inferior wall extending from the base towards the apex, the defect slightly improved in stress images, suggests soft tissue attenuation. However scar in this region could not excluded. Left ventricular systolic function  Calculated by QGS was 72%. This is a low risk study. Clinical correlation is recommended.  Heart Catheterization: Coronary angiogram 11/09/2014: CSI diamont back atherectomy to Mid LAD and Distal dominant Cx and stenting 3.0x39m and 2.5x148mPromus DES. RCA long stenosis and small vessel (angio 10/07/14) left alone (medical management only). LVEF 60%.   LABORATORY DATA: CBC Latest Ref Rng & Units 04/21/2020 04/14/2020 04/07/2020  WBC 4.0 - 10.5 K/uL - - -  Hemoglobin 13.0 - 17.0 g/dL 10.3(L) 9.2(L) 8.1(L)  Hematocrit 39 - 52 % - - -  Platelets 150 - 400 K/uL - - -    CMP Latest Ref Rng & Units 03/11/2020 02/20/2020 02/19/2020  Glucose 70 - 99 mg/dL 317(H) 298(H) 237(H)  BUN 8 - 23 mg/dL 78(H) 69(H) 54(H)  Creatinine 0.61 - 1.24 mg/dL 2.51(H) 2.97(H) 2.60(H)  Sodium 135 - 145 mmol/L 133(L) 133(L) 135  Potassium 3.5 - 5.1 mmol/L 4.7 5.0 4.6  Chloride 98 - 111 mmol/L 103 100 103  CO2 22 - 32 mmol/L 19(L) 21(L) -  Calcium 8.9 - 10.3 mg/dL 8.6(L) 8.4(L) -  Total Protein 6.5 - 8.1 g/dL - - -  Total Bilirubin 0.3 - 1.2 mg/dL - - -  Alkaline Phos 38 - 126 U/L - - -  AST 15 - 41 U/L - - -  ALT 0 - 44 U/L - - -    Lipid Panel     Component Value Date/Time   CHOL 147 11/20/2013 1422   TRIG 160 (H) 11/20/2013 1422   HDL 30 (L) 11/20/2013 1422   CHOLHDL 4.9 11/20/2013 1422   VLDL 32 11/20/2013 1422   LDLCALC 85 11/20/2013 1422    Lab Results  Component Value Date   HGBA1C 8.6 (H) 01/27/2020   HGBA1C 6.5 (H) 09/10/2017   HGBA1C 7.80 02/07/2015   No components found for: NTPROBNP Lab Results  Component Value Date   TSH 1.480 10/08/2014   TSH 4.760 (H) 05/01/2014   TSH 2.048 10/21/2013    Cardiac Panel (last 3 results) No results for input(s): CKTOTAL, CKMB, TROPONINIHS, RELINDX in the last 72 hours.  IMPRESSION:    ICD-10-CM   1. NSVT (nonsustained ventricular tachycardia) (HCC)  I47.2 EKG 12-Lead    PCV ECHOCARDIOGRAM COMPLETE    PCV MYOCARDIAL PERFUSION WITH LEXISCAN     Basic metabolic panel    Magnesium  2. Pacemaker: Medtronic Advisa DR MRI [id number] chamber pacemaker 10/08/2014  Z95.0   3. Atherosclerosis of native coronary artery of native heart without angina pectoris  I25.10 PCV MYOCARDIAL PERFUSION WITH LEXISCAN  4. Atherosclerosis of native artery of lower extremity with gangrene, unspecified laterality ([place] I70.269  5. Status post below-knee amputation of right lower extremity ([place])  Z89.511   6. PAD (peripheral artery disease) (HCC)  I73.9   7. Essential hypertension, benign  I10   8. Cardiac pacemaker in situ  Z95.0   9. Paroxysmal atrial fibrillation (HCC)  I48.0   10. Long term (current) use of anticoagulants  Z79.01   11. CKD (chronic kidney disease) stage 4, GFR 15-29 ml/min (HCC)  N18.4   12. Essential hypertension  I10   13. Alcohol use  Z72.89   14. Former smoker  Z87.891   58. Type 2 diabetes mellitus with hyperglycemia, with long-term current use of insulin (HCC)  E11.65    Z79.4   16. Long-term insulin use (HCC)  Z79.4      RECOMMENDATIONS: CURREN MOHRMANN is a 59 y.o. male whose past medical history and cardiovascular risk factors include: Paroxysmal atrial fibrillation, on oral anticoagulation, coronary artery disease with prior intervention, peripheral artery disease with prior bypass, status post right BKA, pacemaker implantation secondary to complete heart block (implanted 10/07/2014), former smoker, insulin-dependent diabetes mellitus, increased alcohol use, obesity due to excess calories.  Nonsustained ventricular tachycardia:  Noted on recent pacemaker alert.  Most recent remote transmission reviewed and interpreted by my partner Dr. Adrian Prows  Nuclear stress test recommended to evaluate for reversible ischemia.  Echocardiogram will be ordered to evaluate for structural heart disease and left ventricular systolic function.  Patient is already on Lopressor 100 mg p.o. twice daily  Patient consumes at least 10 drinks  4-5 times per week.  Patient is educated on importance of decreasing alcohol consumption as this can lead to cardiomyopathy and electrolyte imbalances which may be contributory factor to nonsustained ventricular tachycardia.  Check BMP and magnesium level.  Patient would like to have the labs done at his PCPs office later today  Paroxysmal atrial fibrillation . Rate control: Metoprolol . Rhythm control: N/A . Thromboembolic prophylaxis: Eliquis . CHA2DS2-VASc SCORE is 3 which correlates to 3.2% risk of stroke per year (hypertension, diabetes, vascular disease). . Patient does not endorse any evidence of bleeding.  Long-term oral anticoagulation: Patient does not endorse any evidence of bleeding Reemphasized the risk of, benefits, and alternatives of oral anticoagulation.  Patient verbalized understanding.  Established coronary artery disease with prior stenting without angina pectoris: See above  Pacemaker in situ: Most recent pacemaker interrogation report reviewed with the patient as discussed above.  Educated on importance of improving his modifiable cardiovascular risk factors which include but not limited to: Decreasing alcohol consumption to no more than 2 drinks per day, blood pressure management, lipid management, and glycemic control.  Patient verbalizes understanding.  FINAL MEDICATION LIST END OF ENCOUNTER: No orders of the defined types were placed in this encounter.   Medications Discontinued During This Encounter  Medication Reason  . insulin NPH-regular Human (70-30) 100 UNIT/ML injection Change in therapy     Current Outpatient Medications:  .  apixaban (ELIQUIS) 5 MG TABS tablet, Take 1 tablet (5 mg total) by mouth 2 (two) times daily. DO NOT TAKE THIS MEDICATION UNTIL WE RESTART IT AFTER YOUR SURGERY., Disp: , Rfl:  .  aspirin 81 MG EC tablet, Take 81 mg by mouth daily. , Disp: , Rfl:  .  atorvastatin (LIPITOR) 20 MG tablet, Take 1 tablet by mouth once daily (Patient  taking differently: Take 20 mg by mouth daily. ), Disp: 90 tablet, Rfl: 2 .  blood glucose meter kit and supplies KIT, Check blood sugar TID & QHS,  Disp: 1 each, Rfl: 0 .  Blood Glucose Monitoring Suppl (ACCU-CHEK ADVANTAGE DIABETES) kit, Use as instructed, Disp: 1 each, Rfl: 0 .  furosemide (LASIX) 40 MG tablet, Take 40 mg by mouth 2 (two) times daily with a meal. , Disp: , Rfl:  .  glucose blood (ACCU-CHEK AVIVA PLUS) test strip, Use as instructed, Disp: 100 each, Rfl: 12 .  glucose blood (CHOICE DM FORA G20 TEST STRIPS) test strip, Check blood sugar TID & QHS, Disp: 100 each, Rfl: 12 .  glucose blood test strip, Use as instructed, Disp: 100 each, Rfl: 12 .  hydrALAZINE (APRESOLINE) 100 MG tablet, TAKE 1 TABLET BY MOUTH THREE TIMES DAILY (Patient taking differently: Take 100 mg by mouth 2 (two) times daily. ), Disp: 270 tablet, Rfl: 0 .  insulin NPH-regular Human (70-30) 100 UNIT/ML injection, Inject 46-56 Units into the skin daily with breakfast., Disp: , Rfl:  .  lisinopril (ZESTRIL) 10 MG tablet, Take 1 tablet (10 mg total) by mouth daily., Disp: , Rfl:  .  metoprolol tartrate (LOPRESSOR) 100 MG tablet, Take 1 tablet by mouth twice daily (Patient taking differently: Take 100 mg by mouth 2 (two) times daily. ), Disp: 180 tablet, Rfl: 0 .  Multiple Vitamin (MULTIVITAMIN WITH MINERALS) TABS tablet, Take 1 tablet by mouth daily., Disp: , Rfl:  .  oxyCODONE (OXY IR/ROXICODONE) 5 MG immediate release tablet, Take 1-2 tablets (5-10 mg total) by mouth every 4 (four) hours as needed for moderate pain (pain score 4-6)., Disp: 30 tablet, Rfl: 0 .  prazosin (MINIPRESS) 2 MG capsule, TAKE 1 CAPSULE BY MOUTH IN THE EVENING AFTER SUPPER (Patient taking differently: Take 2 mg by mouth daily after supper. ), Disp: 90 capsule, Rfl: 0 .  ACCU-CHEK FASTCLIX LANCETS MISC, Check blood sugar TID & QHS, Disp: 102 each, Rfl: 2  Orders Placed This Encounter  Procedures  . Basic metabolic panel  . Magnesium  . PCV  MYOCARDIAL PERFUSION WITH LEXISCAN  . EKG 12-Lead  . PCV ECHOCARDIOGRAM COMPLETE   --Continue cardiac medications as reconciled in final medication list. --Return in about 6 weeks (around 06/03/2020) for review labs and stress and echo results. . Or sooner if needed. --Continue follow-up with your primary care physician regarding the management of your other chronic comorbid conditions.  Patient's questions and concerns were addressed to his satisfaction. He voices understanding of the instructions provided during this encounter.   Total encounter time 70 minutes. *Total Encounter Time as defined by the Centers for Medicare and Medicaid Services includes, in addition to the face-to-face time of a patient visit (documented in the note above) non-face-to-face time: reviewing hospitalization history,reviewing medications, tests or procedures, care coordination (communications with other health care professionals or caregivers) and documentation in the medical record.  This note was created using a voice recognition software as a result there may be grammatical errors inadvertently enclosed that do not reflect the nature of this encounter. Every attempt is made to correct such errors.  Rex Kras, [place], [facility]  Pager: [phone] Office: [phone]

## 2020-04-26 ENCOUNTER — Telehealth: Payer: Self-pay

## 2020-04-26 NOTE — Telephone Encounter (Signed)
Spoke to patients spouse. She appreciates your time in reviewing his chart. Patient had a leg amputation and is not doing well with the bleeding. Patient will be seeing PCP next week. Patient will call back when feeling better. She understand the concern for blockages and will follow up with office as soon as possible.   Thank you

## 2020-04-26 NOTE — Telephone Encounter (Signed)
Please inform the wife that the arrhythmia that was noted on his device can be concerning for blockage in the arteries around his heart. That is why the echo and stress test were requested. She is more than welcome to transition care if needed but for his safety would recommend follow up with cardiology.

## 2020-04-27 ENCOUNTER — Other Ambulatory Visit: Payer: Medicare Other

## 2020-04-28 ENCOUNTER — Encounter (HOSPITAL_COMMUNITY)
Admission: RE | Admit: 2020-04-28 | Discharge: 2020-04-28 | Disposition: A | Discharge status: Home / Self Care | Payer: Medicare Other | Source: Ambulatory Visit | Attending: Nephrology | Admitting: Nephrology

## 2020-04-28 ENCOUNTER — Other Ambulatory Visit: Payer: Self-pay

## 2020-04-28 VITALS — BP 124/50 | HR 66 | Temp 96.5°F | Resp 20

## 2020-04-28 DIAGNOSIS — N183 Chronic kidney disease, stage 3 unspecified: Secondary | ICD-10-CM | POA: Diagnosis not present

## 2020-04-28 DIAGNOSIS — E1122 Type 2 diabetes mellitus with diabetic chronic kidney disease: Secondary | ICD-10-CM | POA: Diagnosis not present

## 2020-04-28 LAB — POCT HEMOGLOBIN-HEMACUE: Hemoglobin: 11.4 g/dL — ABNORMAL LOW (ref 13.0–17.0)

## 2020-04-28 LAB — IRON AND TIBC
Iron: 41 ug/dL — ABNORMAL LOW (ref 45–182)
Saturation Ratios: 20 % (ref 17.9–39.5)
TIBC: 202 ug/dL — ABNORMAL LOW (ref 250–450)
UIBC: 161 ug/dL

## 2020-04-28 LAB — FERRITIN: Ferritin: 201 ng/mL (ref 24–336)

## 2020-04-28 MED ORDER — DARBEPOETIN ALFA 200 MCG/0.4ML IJ SOSY
PREFILLED_SYRINGE | INTRAMUSCULAR | Status: AC
  Filled 2020-04-28: qty 0.4

## 2020-04-28 MED ORDER — DARBEPOETIN ALFA 200 MCG/0.4ML IJ SOSY
200.0000 ug | PREFILLED_SYRINGE | INTRAMUSCULAR | Status: DC
  Administered 2020-04-28: 200 ug via SUBCUTANEOUS

## 2020-04-29 ENCOUNTER — Other Ambulatory Visit: Payer: Medicare Other

## 2020-05-03 ENCOUNTER — Ambulatory Visit (INDEPENDENT_AMBULATORY_CARE_PROVIDER_SITE_OTHER): Payer: Medicare Other | Admitting: Family

## 2020-05-03 ENCOUNTER — Encounter: Payer: Self-pay | Admitting: Family

## 2020-05-03 ENCOUNTER — Other Ambulatory Visit: Payer: Self-pay

## 2020-05-03 VITALS — Ht 72.0 in | Wt 265.0 lb

## 2020-05-03 DIAGNOSIS — Z89511 Acquired absence of right leg below knee: Secondary | ICD-10-CM

## 2020-05-03 NOTE — Progress Notes (Signed)
Post-Op Visit Note   Patient: Alexander [name]           Date of Birth: 1960-12-30           MRN: [id number] Visit Date: 05/03/2020 PCP: [name], MD  Chief Complaint:  Chief Complaint  Patient presents with  . Right Leg - Routine Post Op    03/11/20 right BKA     HPI:  HPI The patient is a 59 year old gentleman who presents today status post right below-knee amputation on April 30.  He is currently working with Museum/gallery curator for his prosthesis set up.  His daughter accompanies the visit.  She feels that his ulcerated areas are slowly improving.  She is seeing less drainage from the lateral ulcer.  Her currently using the shrinker with direct skin contact and padding with gauze outside the shrinker.  Ortho Exam On examination of the right residual limb there are scattered ulcerations.  Centrally there is a 15 mm in diameter ulcer this is 2 mm deep this is filled in bleeding granulation tissue.  Laterally there is a 2 cm in diameter ulcer this is 50% filled in with fibrinous tissue is 5 mm deep there is bleeding there is no purulence no surrounding erythema no odor no sign of infection  Visit Diagnoses:  1. Right below-knee amputee [facility])     Plan: We will continue with daily Dial soap cleansing.  Dry dressing changes.  Shrinker with direct skin contact.  He will follow-up in 2 weeks  Follow-Up Instructions: Return in about 2 weeks (around 05/17/2020).   Imaging: No results found.  Orders:  No orders of the defined types were placed in this encounter.  No orders of the defined types were placed in this encounter.    PMFS History: Patient Active Problem List   Diagnosis Date Noted  . Acute hematogenous osteomyelitis of right foot ([place]) 03/11/2020  . Gangrene of right foot ([place])   . Diabetic foot ulcer associated with type 2 diabetes mellitus ([place]) 02/19/2020  . Heel ulcer ([place]) 11/05/2019  . Atherosclerotic PVD with ulceration ([place]) 09/09/2017  . Essential hypertension,  benign 02/07/2015  . Renal insufficiency 02/07/2015  . Post PTCA 11/09/2014  . S/P PTCA (percutaneous transluminal coronary angioplasty) 11/09/2014  . Pacemaker: [place] DR MRI [id number] Dual chamber pacemaker 10/08/2014 10/08/2014  . Encounter for care of pacemaker 10/08/2014  . Sinus node dysfunction ([place]) 10/08/2014  . Cardiac asystole ([place]) 10/07/2014  . Cardiac syncope 10/07/2014  . Diabetes mellitus with stage 3 chronic kidney disease ([place]) 10/07/2014  . CAD (coronary artery disease), native coronary artery 10/07/2014  . Diabetes mellitus due to underlying condition without complications ([place]) 38/25/0539  . Anemia, unspecified 05/25/2014  . Syncope 04/30/2014  . Atherosclerosis of native arteries of the extremities with gangrene ([place]) 12/16/2013  . Volume overload 10/30/2013  . Acute renal failure ([place]) 10/28/2013  . PAD (peripheral artery disease) ([place]) 10/27/2013  . Leukocytosis 10/21/2013  . Anemia 10/21/2013  . Osteomyelitis of left foot ([place]) 08/19/2013  . Spinal stenosis, lumbar region, with neurogenic claudication 06/12/2012    Class: Diagnosis of  . DIABETES MELLITUS, TYPE II 03/15/2009  . ALCOHOL ABUSE 03/15/2009  . TOBACCO USE 03/15/2009  . HYPERTENSION 03/15/2009   Past Medical History:  Diagnosis Date  . Alcohol abuse   . Anemia   . Arthritis    "patient does not think so."  . Atrial fibrillation ([place])   . Cardiac syncope 10/07/14   rapid A fib with  8 sec pauses on converison with syncope- temp pacing wire placed then PPM  . Cataract    BILATERAL  . Chronic kidney disease   . Coronary artery disease   . Diabetes mellitus    dx---been  awhile  . Encounter for care of pacemaker 10/08/2014  . History of blood transfusion   . Hyperlipemia   . Hypertension   . Osteomyelitis ([place])    right foot  . Peripheral vascular disease ([place])   . Presence of permanent cardiac pacemaker 10/08/2014  . Sinus node dysfunction ([place]) 10/08/2014    Family History    Problem Relation Age of Onset  . Diabetes type II Mother   . Hypertension Mother   . Diabetes Mother   . Liver cancer Father   . Diabetes type II Sister   . Breast cancer Sister   . Diabetes Sister   . Hypertension Sister   . Diabetes type II Brother   . Kidney failure Brother   . Diabetes Brother   . Hypertension Brother   . Diabetes type II Sister     Past Surgical History:  Procedure Laterality Date  . ABDOMINAL AORTOGRAM W/LOWER EXTREMITY N/A 09/09/2017   Procedure: ABDOMINAL AORTOGRAM W/LOWER EXTREMITY;  Surgeon: Angelia Mould, MD;  Location: [place] [facility];  Service: Cardiovascular;  Laterality: N/A;  . ABDOMINAL AORTOGRAM W/LOWER EXTREMITY Right 11/11/2019   Procedure: ABDOMINAL AORTOGRAM W/LOWER EXTREMITY;  Surgeon: Marty Heck, MD;  Location: [place] [facility];  Service: Cardiovascular;  Laterality: Right;  . AMPUTATION Left 08/19/2013   Procedure: AMPUTATION RAY;  Surgeon: Alta Corning, MD;  Location: [place];  Service: Orthopedics;  Laterality: Left;  ray amputation left 5th  . AMPUTATION Left 10/27/2013   Procedure: AMPUTATION DIGIT-LEFT 4TH TOE, 4th and 5th metatarsal.;  Surgeon: Angelia Mould, MD;  Location: [place];  Service: Vascular;  Laterality: Left;  . AMPUTATION Left 11/04/2013   Procedure: LEFT FOOT TRANS-METATARSAL AMPUTATION WITH WOUND CLOSURE ;  Surgeon: Alta Corning, MD;  Location: [place];  Service: Orthopedics;  Laterality: Left;  . AMPUTATION Right 09/10/2017   Procedure: RIGHT FOURTH AND FIFTH TOE AMPUTATION;  Surgeon: Angelia Mould, MD;  Location: [place];  Service: Vascular;  Laterality: Right;  . AMPUTATION Right 02/19/2020   Procedure: AMPUTATION RIGHT 3RD TOE;  Surgeon: Angelia Mould, MD;  Location: [place];  Service: Vascular;  Laterality: Right;  . AMPUTATION Right 03/11/2020   Procedure: RIGHT BELOW KNEE AMPUTATION;  Surgeon: Newt Minion, MD;  Location: [place];  Service: Orthopedics;  Laterality: Right;  .  APPLICATION OF WOUND VAC Right 02/19/2020   Procedure: Application Of Wound Vac Right foot;  Surgeon: Angelia Mould, MD;  Location: [place];  Service: Vascular;  Laterality: Right;  . CARDIAC CATHETERIZATION  10/07/2014   Procedure: LEFT HEART CATH AND CORONARY ANGIOGRAPHY;  Surgeon: Laverda Page, MD;  Location: [facility] C[facility] LAB;  Service: Cardiovascular;;  . COLONOSCOPY W/ POLYPECTOMY    . EMBOLECTOMY Right 09/12/2017   Procedure: Thrombectomy  & Redo Right Below Knee Popliteal Artery Bypass Graft  ;  Surgeon: Waynetta Sandy, MD;  Location: [place];  Service: Vascular;  Laterality: Right;  . EYE SURGERY Bilateral    cataract  . FEMORAL-TIBIAL BYPASS GRAFT Left 10/27/2013   Procedure: BYPASS GRAFT LEFT FEMORAL- POSTERIOR TIBIAL ARTERY;  Surgeon: Angelia Mould, MD;  Location: [place];  Service: Vascular;  Laterality: Left;  . FEMORAL-TIBIAL BYPASS GRAFT Right 09/10/2017  Procedure: RIGHT SUPERFICIAL  FEMORAL ARTERY-BELOW KNEE POPLITEAL ARTERY BYPASS GRAFT WITH VEIN;  Surgeon: Angelia Mould, MD;  Location: [place];  Service: Vascular;  Laterality: Right;  . I & D EXTREMITY Left 10/27/2013   Procedure: IRRIGATION AND DEBRIDEMENT EXTREMITY- LEFT FOOT;  Surgeon: Angelia Mould, MD;  Location: [place];  Service: Vascular;  Laterality: Left;  . I & D EXTREMITY Right 01/26/2020   Procedure: IRRIGATION AND DEBRIDEMENT EXTREMITY RIGHT FOOT WOUND;  Surgeon: Angelia Mould, MD;  Location: [place];  Service: Vascular;  Laterality: Right;  . INTRAOPERATIVE ARTERIOGRAM Right 09/10/2017   Procedure: INTRA OPERATIVE ARTERIOGRAM;  Surgeon: Angelia Mould, MD;  Location: [place];  Service: Vascular;  Laterality: Right;  . IR ANGIOGRAM FOLLOW UP STUDY  09/09/2017  . LEFT HEART CATHETERIZATION WITH CORONARY ANGIOGRAM N/A 11/09/2014   Procedure: LEFT HEART CATHETERIZATION WITH CORONARY ANGIOGRAM;  Surgeon: Laverda Page, MD;  Location: [facility] CATH LAB;  Service:  Cardiovascular;  Laterality: N/A;  . LOWER EXTREMITY ANGIOGRAM Right 11/08/2015   Procedure: Lower Extremity Angiogram;  Surgeon: Serafina Mitchell, MD;  Location: [place] [facility];  Service: Cardiovascular;  Laterality: Right;  . LUMBAR LAMINECTOMY  06/13/2012   Procedure: MICRODISCECTOMY LUMBAR LAMINECTOMY;  Surgeon: Jessy Oto, MD;  Location: [place];  Service: Orthopedics;  Laterality: N/A;  Central laminectomy L2-3, L3-4, L4-5  . PACEMAKER INSERTION  10/08/14   MDT Ad[employer]a MRI compatible dual chamber pacemaker implanted by Dr Caryl Comes for syncope with post-termination pauses  . PERCUTANEOUS CORONARY STENT INTERVENTION (PCI-S)  11/09/2014   des to lad & distal circumflex         Dr  [name]  . PERIPHERAL VASCULAR BALLOON ANGIOPLASTY Right 11/11/2019   Procedure: PERIPHERAL VASCULAR BALLOON ANGIOPLASTY;  Surgeon: Marty Heck, MD;  Location: [place] [facility];  Service: Cardiovascular;  Laterality: Right;  below knee popliteal, tibioperoneal trunk, posterior tibial arteries  . PERIPHERAL VASCULAR CATHETERIZATION N/A 11/08/2015   Procedure: Abdominal Aortogram;  Surgeon: Serafina Mitchell, MD;  Location: [place] [facility];  Service: Cardiovascular;  Laterality: N/A;  . PERIPHERAL VASCULAR CATHETERIZATION Right 11/08/2015   Procedure: Peripheral Vascular Atherectomy;  Surgeon: Serafina Mitchell, MD;  Location: [place] [facility];  Service: Cardiovascular;  Laterality: Right;  . PERIPHERAL VASCULAR CATHETERIZATION N/A 10/10/2016   Procedure: Lower Extremity Angiography;  Surgeon: Waynetta Sandy, MD;  Location: [place] [facility];  Service: Cardiovascular;  Laterality: N/A;  . PERIPHERAL VASCULAR CATHETERIZATION Right 10/10/2016   Procedure: Peripheral Vascular Atherectomy;  Surgeon: Waynetta Sandy, MD;  Location: [place] [facility];  Service: Cardiovascular;  Laterality: Right;  Popliteal  . PERMANENT PACEMAKER INSERTION N/A 10/08/2014   Procedure: PERMANENT PACEMAKER  INSERTION;  Surgeon: Deboraha Sprang, MD;  Location: [facility] CATH LAB;  Service: Cardiovascular;  Laterality: N/A;  . TEMPORARY PACEMAKER INSERTION Bilateral 10/07/2014   Procedure: TEMPORARY PACEMAKER INSERTION;  Surgeon: Laverda Page, MD;  Location: [facility] CATH LAB;  Service: Cardiovascular;  Laterality: Bilateral;  . WOUND DEBRIDEMENT Right 02/19/2020   Procedure: DEBRIDEMENT WOUND RIGHT FOOT;  Surgeon: Angelia Mould, MD;  Location: [facility] OR;  Service: Vascular;  Laterality: Right;   Social History   Occupational History  . Occupation: Disabled  Tobacco Use  . Smoking status: Former Smoker    Packs/day: 1.50    Years: 30.00    Pack years: 45.00    Types: Cigars    Quit date: 10/07/2014    Years since quitting: 5.5  .  Smokeless tobacco: Never Used  Vaping Use  . Vaping Use: Never used  Substance and Sexual Activity  . Alcohol use: Yes    Alcohol/week: 6.0 standard drinks    Types: 6 Cans of beer per week    Comment: 2-3 beers daily  . Drug use: No  . Sexual activity: Not on file

## 2020-05-05 ENCOUNTER — Other Ambulatory Visit: Payer: Self-pay

## 2020-05-05 ENCOUNTER — Ambulatory Visit (HOSPITAL_COMMUNITY)
Admission: RE | Admit: 2020-05-05 | Discharge: 2020-05-05 | Disposition: A | Discharge status: Home / Self Care | Payer: Medicare Other | Source: Ambulatory Visit | Attending: Nephrology | Admitting: Nephrology

## 2020-05-05 VITALS — BP 149/53 | HR 68 | Temp 97.2°F

## 2020-05-05 DIAGNOSIS — N183 Chronic kidney disease, stage 3 unspecified: Secondary | ICD-10-CM | POA: Diagnosis not present

## 2020-05-05 DIAGNOSIS — E1122 Type 2 diabetes mellitus with diabetic chronic kidney disease: Secondary | ICD-10-CM | POA: Diagnosis not present

## 2020-05-05 LAB — POCT HEMOGLOBIN-HEMACUE: Hemoglobin: 11.8 g/dL — ABNORMAL LOW (ref 13.0–17.0)

## 2020-05-05 MED ORDER — DARBEPOETIN ALFA 200 MCG/0.4ML IJ SOSY
200.0000 ug | PREFILLED_SYRINGE | INTRAMUSCULAR | Status: DC
  Administered 2020-05-05: 200 ug via SUBCUTANEOUS

## 2020-05-05 MED ORDER — DARBEPOETIN ALFA 200 MCG/0.4ML IJ SOSY
PREFILLED_SYRINGE | INTRAMUSCULAR | Status: AC
  Filled 2020-05-05: qty 0.4

## 2020-05-12 ENCOUNTER — Encounter (HOSPITAL_COMMUNITY)
Admission: RE | Admit: 2020-05-12 | Discharge: 2020-05-12 | Disposition: A | Discharge status: Home / Self Care | Payer: Medicare Other | Source: Ambulatory Visit | Attending: Nephrology | Admitting: Nephrology

## 2020-05-12 ENCOUNTER — Other Ambulatory Visit: Payer: Self-pay

## 2020-05-12 VITALS — BP 148/63 | HR 60 | Temp 97.3°F | Resp 18

## 2020-05-12 DIAGNOSIS — N183 Chronic kidney disease, stage 3 unspecified: Secondary | ICD-10-CM

## 2020-05-12 DIAGNOSIS — E1122 Type 2 diabetes mellitus with diabetic chronic kidney disease: Secondary | ICD-10-CM | POA: Insufficient documentation

## 2020-05-12 LAB — POCT HEMOGLOBIN-HEMACUE: Hemoglobin: 12.2 g/dL — ABNORMAL LOW (ref 13.0–17.0)

## 2020-05-12 MED ORDER — DARBEPOETIN ALFA 200 MCG/0.4ML IJ SOSY: 200.0000 ug | PREFILLED_SYRINGE | INTRAMUSCULAR | Status: DC

## 2020-05-13 ENCOUNTER — Other Ambulatory Visit: Payer: Self-pay | Admitting: Cardiology

## 2020-05-13 DIAGNOSIS — I495 Sick sinus syndrome: Secondary | ICD-10-CM

## 2020-05-19 ENCOUNTER — Encounter (HOSPITAL_COMMUNITY): Payer: Medicare Other

## 2020-05-23 ENCOUNTER — Inpatient Hospital Stay (HOSPITAL_COMMUNITY): Payer: Medicare Other

## 2020-05-23 ENCOUNTER — Encounter (HOSPITAL_COMMUNITY): Payer: Self-pay

## 2020-05-23 ENCOUNTER — Inpatient Hospital Stay (HOSPITAL_COMMUNITY)
Admission: EM | Admit: 2020-05-23 | Discharge: 2020-06-05 | DRG: 378 | Disposition: A | Discharge status: Home / Self Care | Payer: Medicare Other | Attending: Family Medicine | Admitting: Family Medicine

## 2020-05-23 ENCOUNTER — Other Ambulatory Visit: Payer: Self-pay

## 2020-05-23 DIAGNOSIS — Z89422 Acquired absence of other left toe(s): Secondary | ICD-10-CM

## 2020-05-23 DIAGNOSIS — E875 Hyperkalemia: Secondary | ICD-10-CM | POA: Diagnosis present

## 2020-05-23 DIAGNOSIS — N184 Chronic kidney disease, stage 4 (severe): Secondary | ICD-10-CM | POA: Diagnosis not present

## 2020-05-23 DIAGNOSIS — Z7901 Long term (current) use of anticoagulants: Secondary | ICD-10-CM

## 2020-05-23 DIAGNOSIS — E669 Obesity, unspecified: Secondary | ICD-10-CM | POA: Diagnosis not present

## 2020-05-23 DIAGNOSIS — Z9114 Patient's other noncompliance with medication regimen: Secondary | ICD-10-CM

## 2020-05-23 DIAGNOSIS — Z713 Dietary counseling and surveillance: Secondary | ICD-10-CM

## 2020-05-23 DIAGNOSIS — K2981 Duodenitis with bleeding: Principal | ICD-10-CM | POA: Diagnosis present

## 2020-05-23 DIAGNOSIS — K922 Gastrointestinal hemorrhage, unspecified: Secondary | ICD-10-CM | POA: Insufficient documentation

## 2020-05-23 DIAGNOSIS — I10 Essential (primary) hypertension: Secondary | ICD-10-CM | POA: Diagnosis not present

## 2020-05-23 DIAGNOSIS — R195 Other fecal abnormalities: Secondary | ICD-10-CM

## 2020-05-23 DIAGNOSIS — I2510 Atherosclerotic heart disease of native coronary artery without angina pectoris: Secondary | ICD-10-CM | POA: Diagnosis not present

## 2020-05-23 DIAGNOSIS — Z4901 Encounter for fitting and adjustment of extracorporeal dialysis catheter: Secondary | ICD-10-CM | POA: Diagnosis not present

## 2020-05-23 DIAGNOSIS — Z6836 Body mass index (BMI) 36.0-36.9, adult: Secondary | ICD-10-CM

## 2020-05-23 DIAGNOSIS — Z86010 Personal history of colonic polyps: Secondary | ICD-10-CM

## 2020-05-23 DIAGNOSIS — Z7982 Long term (current) use of aspirin: Secondary | ICD-10-CM

## 2020-05-23 DIAGNOSIS — K2980 Duodenitis without bleeding: Secondary | ICD-10-CM | POA: Diagnosis not present

## 2020-05-23 DIAGNOSIS — Z87891 Personal history of nicotine dependence: Secondary | ICD-10-CM | POA: Diagnosis not present

## 2020-05-23 DIAGNOSIS — E1122 Type 2 diabetes mellitus with diabetic chronic kidney disease: Secondary | ICD-10-CM | POA: Diagnosis present

## 2020-05-23 DIAGNOSIS — Z03818 Encounter for observation for suspected exposure to other biological agents ruled out: Secondary | ICD-10-CM | POA: Diagnosis not present

## 2020-05-23 DIAGNOSIS — Z794 Long term (current) use of insulin: Secondary | ICD-10-CM

## 2020-05-23 DIAGNOSIS — I4891 Unspecified atrial fibrillation: Secondary | ICD-10-CM | POA: Diagnosis not present

## 2020-05-23 DIAGNOSIS — I129 Hypertensive chronic kidney disease with stage 1 through stage 4 chronic kidney disease, or unspecified chronic kidney disease: Secondary | ICD-10-CM | POA: Diagnosis present

## 2020-05-23 DIAGNOSIS — E1165 Type 2 diabetes mellitus with hyperglycemia: Secondary | ICD-10-CM | POA: Diagnosis present

## 2020-05-23 DIAGNOSIS — D631 Anemia in chronic kidney disease: Secondary | ICD-10-CM | POA: Diagnosis present

## 2020-05-23 DIAGNOSIS — K921 Melena: Secondary | ICD-10-CM | POA: Diagnosis not present

## 2020-05-23 DIAGNOSIS — I160 Hypertensive urgency: Secondary | ICD-10-CM | POA: Diagnosis present

## 2020-05-23 DIAGNOSIS — E1151 Type 2 diabetes mellitus with diabetic peripheral angiopathy without gangrene: Secondary | ICD-10-CM | POA: Diagnosis not present

## 2020-05-23 DIAGNOSIS — I361 Nonrheumatic tricuspid (valve) insufficiency: Secondary | ICD-10-CM | POA: Diagnosis not present

## 2020-05-23 DIAGNOSIS — I495 Sick sinus syndrome: Secondary | ICD-10-CM | POA: Diagnosis present

## 2020-05-23 DIAGNOSIS — E089 Diabetes mellitus due to underlying condition without complications: Secondary | ICD-10-CM | POA: Diagnosis present

## 2020-05-23 DIAGNOSIS — Z79899 Other long term (current) drug therapy: Secondary | ICD-10-CM

## 2020-05-23 DIAGNOSIS — Z888 Allergy status to other drugs, medicaments and biological substances status: Secondary | ICD-10-CM

## 2020-05-23 DIAGNOSIS — R58 Hemorrhage, not elsewhere classified: Secondary | ICD-10-CM | POA: Diagnosis not present

## 2020-05-23 DIAGNOSIS — E1129 Type 2 diabetes mellitus with other diabetic kidney complication: Secondary | ICD-10-CM | POA: Diagnosis not present

## 2020-05-23 DIAGNOSIS — I739 Peripheral vascular disease, unspecified: Secondary | ICD-10-CM | POA: Diagnosis present

## 2020-05-23 DIAGNOSIS — Z20822 Contact with and (suspected) exposure to covid-19: Secondary | ICD-10-CM | POA: Diagnosis present

## 2020-05-23 DIAGNOSIS — N183 Chronic kidney disease, stage 3 unspecified: Secondary | ICD-10-CM

## 2020-05-23 DIAGNOSIS — M48062 Spinal stenosis, lumbar region with neurogenic claudication: Secondary | ICD-10-CM | POA: Diagnosis not present

## 2020-05-23 DIAGNOSIS — N179 Acute kidney failure, unspecified: Secondary | ICD-10-CM | POA: Diagnosis not present

## 2020-05-23 DIAGNOSIS — E785 Hyperlipidemia, unspecified: Secondary | ICD-10-CM | POA: Diagnosis present

## 2020-05-23 DIAGNOSIS — I480 Paroxysmal atrial fibrillation: Secondary | ICD-10-CM | POA: Diagnosis not present

## 2020-05-23 DIAGNOSIS — Z7289 Other problems related to lifestyle: Secondary | ICD-10-CM

## 2020-05-23 DIAGNOSIS — D649 Anemia, unspecified: Secondary | ICD-10-CM | POA: Diagnosis not present

## 2020-05-23 DIAGNOSIS — Z882 Allergy status to sulfonamides status: Secondary | ICD-10-CM

## 2020-05-23 DIAGNOSIS — Z833 Family history of diabetes mellitus: Secondary | ICD-10-CM

## 2020-05-23 DIAGNOSIS — Z950 Presence of cardiac pacemaker: Secondary | ICD-10-CM | POA: Diagnosis not present

## 2020-05-23 DIAGNOSIS — R319 Hematuria, unspecified: Secondary | ICD-10-CM | POA: Diagnosis not present

## 2020-05-23 DIAGNOSIS — N058 Unspecified nephritic syndrome with other morphologic changes: Secondary | ICD-10-CM

## 2020-05-23 DIAGNOSIS — D696 Thrombocytopenia, unspecified: Secondary | ICD-10-CM | POA: Diagnosis present

## 2020-05-23 DIAGNOSIS — D72829 Elevated white blood cell count, unspecified: Secondary | ICD-10-CM | POA: Diagnosis not present

## 2020-05-23 DIAGNOSIS — I340 Nonrheumatic mitral (valve) insufficiency: Secondary | ICD-10-CM | POA: Diagnosis not present

## 2020-05-23 DIAGNOSIS — N2889 Other specified disorders of kidney and ureter: Secondary | ICD-10-CM | POA: Diagnosis not present

## 2020-05-23 DIAGNOSIS — Q600 Renal agenesis, unilateral: Secondary | ICD-10-CM | POA: Diagnosis not present

## 2020-05-23 DIAGNOSIS — Z8249 Family history of ischemic heart disease and other diseases of the circulatory system: Secondary | ICD-10-CM

## 2020-05-23 DIAGNOSIS — E872 Acidosis: Secondary | ICD-10-CM | POA: Diagnosis present

## 2020-05-23 DIAGNOSIS — N19 Unspecified kidney failure: Secondary | ICD-10-CM | POA: Diagnosis not present

## 2020-05-23 DIAGNOSIS — N059 Unspecified nephritic syndrome with unspecified morphologic changes: Secondary | ICD-10-CM

## 2020-05-23 DIAGNOSIS — Z96651 Presence of right artificial knee joint: Secondary | ICD-10-CM | POA: Diagnosis present

## 2020-05-23 DIAGNOSIS — Z992 Dependence on renal dialysis: Secondary | ICD-10-CM

## 2020-05-23 DIAGNOSIS — Z881 Allergy status to other antibiotic agents status: Secondary | ICD-10-CM

## 2020-05-23 DIAGNOSIS — R809 Proteinuria, unspecified: Secondary | ICD-10-CM | POA: Diagnosis not present

## 2020-05-23 DIAGNOSIS — Z89511 Acquired absence of right leg below knee: Secondary | ICD-10-CM | POA: Diagnosis not present

## 2020-05-23 LAB — CBG MONITORING, ED
Glucose-Capillary: 172 mg/dL — ABNORMAL HIGH (ref 70–99)
Glucose-Capillary: 142 mg/dL — ABNORMAL HIGH (ref 70–99)

## 2020-05-23 LAB — CBC
HCT: 35.9 % — ABNORMAL LOW (ref 39.0–52.0)
HCT: 34.6 % — ABNORMAL LOW (ref 39.0–52.0)
Hemoglobin: 11.5 g/dL — ABNORMAL LOW (ref 13.0–17.0)
Hemoglobin: 11.3 g/dL — ABNORMAL LOW (ref 13.0–17.0)
MCH: 28.5 pg (ref 26.0–34.0)
MCH: 29.4 pg (ref 26.0–34.0)
MCHC: 32 g/dL (ref 30.0–36.0)
MCHC: 32.7 g/dL (ref 30.0–36.0)
MCV: 89.1 fL (ref 80.0–100.0)
MCV: 89.9 fL (ref 80.0–100.0)
Platelets: 143 10*3/uL — ABNORMAL LOW (ref 150–400)
Platelets: 138 10*3/uL — ABNORMAL LOW (ref 150–400)
RBC: 4.03 MIL/uL — ABNORMAL LOW (ref 4.22–5.81)
RBC: 3.85 MIL/uL — ABNORMAL LOW (ref 4.22–5.81)
RDW: 14.1 % (ref 11.5–15.5)
RDW: 14.2 % (ref 11.5–15.5)
WBC: 5.7 10*3/uL (ref 4.0–10.5)
WBC: 6 10*3/uL (ref 4.0–10.5)
nRBC: 0 % (ref 0.0–0.2)
nRBC: 0 % (ref 0.0–0.2)

## 2020-05-23 LAB — COMPREHENSIVE METABOLIC PANEL
ALT: 20 U/L (ref 0–44)
AST: 14 U/L — ABNORMAL LOW (ref 15–41)
Albumin: 3.5 g/dL (ref 3.5–5.0)
Alkaline Phosphatase: 105 U/L (ref 38–126)
Anion gap: 14 (ref 5–15)
BUN: 97 mg/dL — ABNORMAL HIGH (ref 8–23)
CO2: 16 mmol/L — ABNORMAL LOW (ref 22–32)
Calcium: 8.4 mg/dL — ABNORMAL LOW (ref 8.9–10.3)
Chloride: 105 mmol/L (ref 98–111)
Creatinine, Ser: 6.78 mg/dL — ABNORMAL HIGH (ref 0.61–1.24)
GFR calc Af Amer: 9 mL/min — ABNORMAL LOW (ref >60)
GFR calc non Af Amer: 8 mL/min — ABNORMAL LOW (ref >60)
Glucose, Bld: 142 mg/dL — ABNORMAL HIGH (ref 70–99)
Potassium: 5.8 mmol/L — ABNORMAL HIGH (ref 3.5–5.1)
Sodium: 135 mmol/L (ref 135–145)
Total Bilirubin: 0.3 mg/dL (ref 0.3–1.2)
Total Protein: 6.9 g/dL (ref 6.5–8.1)

## 2020-05-23 LAB — URINALYSIS, ROUTINE W REFLEX MICROSCOPIC
Bilirubin Urine: NEGATIVE
Glucose, UA: 50 mg/dL — AB
Ketones, ur: NEGATIVE mg/dL
Leukocytes,Ua: NEGATIVE
Nitrite: NEGATIVE
Protein, ur: 100 mg/dL — AB
RBC / HPF: >50 RBC/hpf — ABNORMAL HIGH (ref 0–5)
Specific Gravity, Urine: 1.008 (ref 1.005–1.030)
pH: 5 (ref 5.0–8.0)

## 2020-05-23 LAB — SARS CORONAVIRUS 2 BY RT PCR (HOSPITAL ORDER, PERFORMED IN ~~LOC~~ HOSPITAL LAB): SARS Coronavirus 2: NEGATIVE

## 2020-05-23 LAB — HEMOGLOBIN A1C
Hgb A1c MFr Bld: 5.5 % (ref 4.8–5.6)
Mean Plasma Glucose: 111.15 mg/dL

## 2020-05-23 LAB — TYPE AND SCREEN
ABO/RH(D): A POS
Antibody Screen: NEGATIVE

## 2020-05-23 LAB — CREATININE, URINE, RANDOM: Creatinine, Urine: 40.07 mg/dL

## 2020-05-23 LAB — POC OCCULT BLOOD, ED: Fecal Occult Bld: POSITIVE — AB

## 2020-05-23 LAB — SODIUM, URINE, RANDOM: Sodium, Ur: 86 mmol/L

## 2020-05-23 MED ORDER — INSULIN ASPART 100 UNIT/ML ~~LOC~~ SOLN
0.0000 [IU] | SUBCUTANEOUS | Status: DC
  Administered 2020-05-23: 2 [IU] via SUBCUTANEOUS
  Administered 2020-05-24: 3 [IU] via SUBCUTANEOUS
  Administered 2020-05-24: 2 [IU] via SUBCUTANEOUS
  Administered 2020-05-25: 3 [IU] via SUBCUTANEOUS
  Administered 2020-05-25: 5 [IU] via SUBCUTANEOUS
  Administered 2020-05-25: 3 [IU] via SUBCUTANEOUS
  Administered 2020-05-25: 2 [IU] via SUBCUTANEOUS
  Administered 2020-05-26: 5 [IU] via SUBCUTANEOUS
  Administered 2020-05-26 (×4): 3 [IU] via SUBCUTANEOUS
  Administered 2020-05-27 (×3): 5 [IU] via SUBCUTANEOUS
  Administered 2020-05-27: 3 [IU] via SUBCUTANEOUS
  Administered 2020-05-27 – 2020-05-28 (×3): 5 [IU] via SUBCUTANEOUS
  Administered 2020-05-28: 8 [IU] via SUBCUTANEOUS
  Administered 2020-05-28: 5 [IU] via SUBCUTANEOUS
  Administered 2020-05-28: 8 [IU] via SUBCUTANEOUS
  Administered 2020-05-28 (×2): 5 [IU] via SUBCUTANEOUS
  Administered 2020-05-29: 2 [IU] via SUBCUTANEOUS
  Administered 2020-05-29: 3 [IU] via SUBCUTANEOUS
  Administered 2020-05-29 (×4): 5 [IU] via SUBCUTANEOUS
  Administered 2020-05-30: 2 [IU] via SUBCUTANEOUS
  Administered 2020-05-30 (×2): 5 [IU] via SUBCUTANEOUS
  Administered 2020-05-30: 8 [IU] via SUBCUTANEOUS
  Administered 2020-05-30: 5 [IU] via SUBCUTANEOUS
  Administered 2020-05-30 – 2020-06-01 (×4): 3 [IU] via SUBCUTANEOUS
  Administered 2020-06-01 (×2): 8 [IU] via SUBCUTANEOUS
  Administered 2020-06-01: 5 [IU] via SUBCUTANEOUS
  Administered 2020-06-01: 8 [IU] via SUBCUTANEOUS
  Administered 2020-06-02 (×2): 5 [IU] via SUBCUTANEOUS
  Administered 2020-06-02: 8 [IU] via SUBCUTANEOUS
  Administered 2020-06-02: 11 [IU] via SUBCUTANEOUS
  Administered 2020-06-02: 3 [IU] via SUBCUTANEOUS
  Filled 2020-05-23: qty 0.15

## 2020-05-23 MED ORDER — LABETALOL HCL 5 MG/ML IV SOLN
10.0000 mg | INTRAVENOUS | Status: DC | PRN
  Administered 2020-05-23 – 2020-05-24 (×2): 10 mg via INTRAVENOUS
  Filled 2020-05-23 (×2): qty 4

## 2020-05-23 MED ORDER — ACETAMINOPHEN 650 MG RE SUPP: 650.0000 mg | Freq: Four times a day (QID) | RECTAL | Status: DC | PRN

## 2020-05-23 MED ORDER — PRAZOSIN HCL 2 MG PO CAPS
2.0000 mg | ORAL_CAPSULE | Freq: Every day | ORAL | Status: DC
  Administered 2020-05-24 – 2020-06-04 (×12): 2 mg via ORAL
  Filled 2020-05-23 (×2): qty 2
  Filled 2020-05-23 (×2): qty 1
  Filled 2020-05-23 (×2): qty 2
  Filled 2020-05-23: qty 1
  Filled 2020-05-23: qty 2
  Filled 2020-05-23: qty 1
  Filled 2020-05-23: qty 2
  Filled 2020-05-23 (×2): qty 1

## 2020-05-23 MED ORDER — SODIUM ZIRCONIUM CYCLOSILICATE 10 G PO PACK
10.0000 g | PACK | Freq: Once | ORAL | Status: AC
  Administered 2020-05-23: 10 g via ORAL
  Filled 2020-05-23: qty 1

## 2020-05-23 MED ORDER — PANTOPRAZOLE SODIUM 40 MG IV SOLR
40.0000 mg | Freq: Two times a day (BID) | INTRAVENOUS | Status: DC
  Administered 2020-05-23 – 2020-05-29 (×12): 40 mg via INTRAVENOUS
  Filled 2020-05-23 (×12): qty 40

## 2020-05-23 MED ORDER — ACETAMINOPHEN 325 MG PO TABS: 650.0000 mg | ORAL_TABLET | Freq: Four times a day (QID) | ORAL | Status: DC | PRN

## 2020-05-23 MED ORDER — ATORVASTATIN CALCIUM 10 MG PO TABS
20.0000 mg | ORAL_TABLET | Freq: Every day | ORAL | Status: DC
  Administered 2020-05-24 – 2020-06-04 (×12): 20 mg via ORAL
  Filled 2020-05-23: qty 2
  Filled 2020-05-23 (×2): qty 1
  Filled 2020-05-23 (×2): qty 2
  Filled 2020-05-23 (×2): qty 1
  Filled 2020-05-23 (×2): qty 2
  Filled 2020-05-23: qty 1
  Filled 2020-05-23: qty 2
  Filled 2020-05-23 (×2): qty 1

## 2020-05-23 MED ORDER — INSULIN GLARGINE 100 UNIT/ML ~~LOC~~ SOLN: 15.0000 [IU] | Freq: Every day | SUBCUTANEOUS | Status: DC

## 2020-05-23 MED ORDER — ALBUTEROL SULFATE (2.5 MG/3ML) 0.083% IN NEBU: 2.5000 mg | INHALATION_SOLUTION | RESPIRATORY_TRACT | Status: DC | PRN

## 2020-05-23 MED ORDER — SODIUM CHLORIDE 0.9 % IV SOLN
80.0000 mg | Freq: Once | INTRAVENOUS | Status: DC
Start: 2020-05-23 — End: 2020-05-23

## 2020-05-23 MED ORDER — HYDRALAZINE HCL 50 MG PO TABS
100.0000 mg | ORAL_TABLET | Freq: Three times a day (TID) | ORAL | Status: DC
  Administered 2020-05-23 – 2020-06-05 (×33): 100 mg via ORAL
  Filled 2020-05-23 (×37): qty 2

## 2020-05-23 MED ORDER — INSULIN GLARGINE 100 UNIT/ML ~~LOC~~ SOLN
5.0000 [IU] | Freq: Two times a day (BID) | SUBCUTANEOUS | Status: DC
  Administered 2020-05-23 – 2020-05-28 (×8): 5 [IU] via SUBCUTANEOUS
  Filled 2020-05-23 (×11): qty 0.05

## 2020-05-23 MED ORDER — METOPROLOL TARTRATE 100 MG PO TABS
100.0000 mg | ORAL_TABLET | Freq: Two times a day (BID) | ORAL | Status: DC
  Administered 2020-05-23 – 2020-06-04 (×24): 100 mg via ORAL
  Filled 2020-05-23: qty 4
  Filled 2020-05-23: qty 1
  Filled 2020-05-23: qty 4
  Filled 2020-05-23: qty 2
  Filled 2020-05-23: qty 1
  Filled 2020-05-23: qty 2
  Filled 2020-05-23: qty 1
  Filled 2020-05-23: qty 2
  Filled 2020-05-23: qty 1
  Filled 2020-05-23: qty 2
  Filled 2020-05-23 (×2): qty 1
  Filled 2020-05-23 (×2): qty 2
  Filled 2020-05-23: qty 4
  Filled 2020-05-23: qty 2
  Filled 2020-05-23: qty 4
  Filled 2020-05-23: qty 2
  Filled 2020-05-23 (×5): qty 1
  Filled 2020-05-23: qty 2

## 2020-05-23 MED ORDER — ONDANSETRON HCL 4 MG/2ML IJ SOLN: 4.0000 mg | Freq: Four times a day (QID) | INTRAMUSCULAR | Status: DC | PRN

## 2020-05-23 MED ORDER — SODIUM CHLORIDE 0.9 % IV SOLN: INTRAVENOUS | Status: DC

## 2020-05-23 MED ORDER — ONDANSETRON HCL 4 MG PO TABS: 4.0000 mg | ORAL_TABLET | Freq: Four times a day (QID) | ORAL | Status: DC | PRN

## 2020-05-23 MED ORDER — SODIUM CHLORIDE 0.9 % IV SOLN: 8.0000 mg/h | INTRAVENOUS | Status: DC

## 2020-05-23 NOTE — H&P (View-Only) (Signed)
 Referring Provider:  [facility]         Primary Care Physician:  Tisovec, Richard W, MD Primary Gastroenterologist: Jay Pyrtle, MD             We were asked to see this patient for:  Dark , heme + stool                ASSESSMENT /  PLAN    Alexander [name] is a 59 y.o. male PMH significant for, but not necessarily limited to, DM, Afib , HTN, PVD, CKD, adenomatous colon polyps, osteomyelitis s/p right BKA May 2021                                                                                                                         # Dark, heme positive stools on Eliquis / ASA.  --Rule out PUD, AVMs --Hgb 11.5 at baseline --Clears okay today. NPO after MN for possible EGD if Endo schedule allows. The risks and benefits of EGD were discussed and the patient agrees to proceed.  --BID IV PPI --Monitor CBC --I called and updated wife per patient's request  # CKD on AKI.  --Cr 2.5 >> 6.7 --Followed outpatient by Dr. Webb  # Afib on Eliquis  # Hx of adenomatous colon polyps in 2015.  --Overdue for recommended 3 year recall colonoscopy in 2018. This can be done as outpatient unless this current clinical course dictates otherwise.  .     [facility] Attending   I have taken an interval history, reviewed the chart and examined the patient. I agree with the Advanced Practitioner's note, impression and recommendations.    Ebany Bowermaster E. Sadaf Przybysz, MD, FACG [facility] 05/23/2020 7:39 PM   HPI:    Chief Complaint: dark stools  Alexander [name] is a 59 y.o. male on Eliquis with PMH as above. Patient passed dark stool this am, advised by PCP to come to ED. In ED his hgb is 11.5, about at baseline. He has CKD. Cr 2.5 end of April, now at 6.7. His K+ is 5.8. In mid June his ferritin is 201, TIBC 202 with 20 % sat. Stools are heme +.   Patient began having dark, loose stools yesterday and has had a few more today. No Fe+ or bismuth use. No associated abdominal pain. No nausea. He  takes a daily baby asa, no other NSAIDS. On Eliquis, last dose this am. Patient feels fine.    PREVIOUS ENDOSCOPIC EVALUATIONS / GI STUDIES :  Nov 2015 Screening colonoscopy ENDOSCOPIC IMPRESSION: 1. Four sessile polyps ranging from 4 to 6mm in size were found in the ascending colon, transverse colon, and sigmoid colon; polypectomies were performed with a cold snare 2. The examination was otherwise normal  Biopsies - tubular adenomas  Past Medical History:  Diagnosis Date  . Alcohol abuse   . Anemia   . Arthritis    "patient does not think so."  . Atrial fibrillation (HCC)   .   Cardiac syncope 10/07/14   rapid A fib with 8 sec pauses on converison with syncope- temp pacing wire placed then PPM  . Cataract    BILATERAL  . Chronic kidney disease   . Coronary artery disease   . Diabetes mellitus    dx---been  awhile  . Encounter for care of pacemaker 10/08/2014  . History of blood transfusion   . Hyperlipemia   . Hypertension   . Osteomyelitis (HCC)    right foot  . Peripheral vascular disease (HCC)   . Presence of permanent cardiac pacemaker 10/08/2014  . Sinus node dysfunction (HCC) 10/08/2014    Past Surgical History:  Procedure Laterality Date  . ABDOMINAL AORTOGRAM W/LOWER EXTREMITY N/A 09/09/2017   Procedure: ABDOMINAL AORTOGRAM W/LOWER EXTREMITY;  Surgeon: Dickson, [name], MD;  [facility]: [facility];  Service: Cardiovascular;  Laterality: N/A;  . ABDOMINAL AORTOGRAM W/LOWER EXTREMITY Right 11/11/2019   Procedure: ABDOMINAL AORTOGRAM W/LOWER EXTREMITY;  Surgeon: Clark, Christopher J, MD;  [facility]: [facility];  Service: Cardiovascular;  Laterality: Right;  . AMPUTATION Left 08/19/2013   Procedure: AMPUTATION RAY;  Surgeon: [name] Graves, MD;  Location: [facility];  Service: Orthopedics;  Laterality: Left;  ray amputation left 5th  . AMPUTATION Left 10/27/2013   Procedure: AMPUTATION DIGIT-LEFT 4TH TOE, 4th and 5th metatarsal.;  Surgeon: [name]  Dickson, MD;  Location: [facility];  Service: Vascular;  Laterality: Left;  . AMPUTATION Left 11/04/2013   Procedure: LEFT FOOT TRANS-METATARSAL AMPUTATION WITH WOUND CLOSURE ;  Surgeon: [name] Graves, MD;  Location: [facility];  Service: Orthopedics;  Laterality: Left;  . AMPUTATION Right 09/10/2017   Procedure: RIGHT FOURTH AND FIFTH TOE AMPUTATION;  Surgeon: Dickson, [name], MD;  [facility]: [facility];  Service: Vascular;  Laterality: Right;  . AMPUTATION Right 02/19/2020   Procedure: AMPUTATION RIGHT 3RD TOE;  Surgeon: Dickson, [name], MD;  [facility]: [facility];  Service: Vascular;  Laterality: Right;  . AMPUTATION Right 03/11/2020   Procedure: RIGHT BELOW KNEE AMPUTATION;  Surgeon: Duda, Marcus V, MD;  [facility]: [facility];  Service: Orthopedics;  Laterality: Right;  . APPLICATION OF WOUND VAC Right 02/19/2020   Procedure: Application Of Wound Vac Right foot;  Surgeon: Dickson, [name], MD;  [facility]: [facility];  Service: Vascular;  Laterality: Right;  . CARDIAC CATHETERIZATION  10/07/2014   Procedure: LEFT HEART CATH AND CORONARY ANGIOGRAPHY;  Surgeon: Jagadeesh R Ganji, MD;  [facility]: [facility];  Service: Cardiovascular;;  . COLONOSCOPY W/ POLYPECTOMY    . EMBOLECTOMY Right 09/12/2017   Procedure: Thrombectomy  & Redo Right Below Knee Popliteal Artery Bypass Graft  ;  Surgeon: Cain, Brandon Christopher, MD;  [facility]: [facility];  Service: Vascular;  Laterality: Right;  . EYE SURGERY Bilateral    cataract  . FEMORAL-TIBIAL BYPASS GRAFT Left 10/27/2013   Procedure: BYPASS GRAFT LEFT FEMORAL- POSTERIOR TIBIAL ARTERY;  Surgeon: [name] Dickson, MD;  [facility];  Service: Vascular;  Laterality: Left;  . FEMORAL-TIBIAL BYPASS GRAFT Right 09/10/2017   Procedure: RIGHT SUPERFICIAL  FEMORAL ARTERY-BELOW KNEE POPLITEAL ARTERY BYPASS GRAFT WITH VEIN;  Surgeon: Dickson, [name], MD;  [facility];  Service: Vascular;  Laterality: Right;  . I & D EXTREMITY Left 10/27/2013   Procedure: IRRIGATION  AND DEBRIDEMENT EXTREMITY- LEFT FOOT;  Surgeon: [name] Dickson, MD;  [facility];  Service: Vascular;  Laterality: Left;  . I & D EXTREMITY Right 01/26/2020   Procedure: IRRIGATION AND DEBRIDEMENT EXTREMITY RIGHT FOOT   WOUND;  Surgeon: Dickson, [name], MD;  [facility];  Service: Vascular;  Laterality: Right;  . INTRAOPERATIVE ARTERIOGRAM Right 09/10/2017   Procedure: INTRA OPERATIVE ARTERIOGRAM;  Surgeon: Dickson, [name], MD;  [facility];  Service: Vascular;  Laterality: Right;  . IR ANGIOGRAM FOLLOW UP STUDY  09/09/2017  . LEFT HEART CATHETERIZATION WITH CORONARY ANGIOGRAM N/A 11/09/2014   Procedure: LEFT HEART CATHETERIZATION WITH CORONARY ANGIOGRAM;  Surgeon: Jagadeesh R Ganji, MD;  [facility]: [facility];  Service: Cardiovascular;  Laterality: N/A;  . LOWER EXTREMITY ANGIOGRAM Right 11/08/2015   Procedure: Lower Extremity Angiogram;  Surgeon: Vance W Brabham, MD;  [facility];  Service: Cardiovascular;  Laterality: Right;  . LUMBAR LAMINECTOMY  06/13/2012   Procedure: MICRODISCECTOMY LUMBAR LAMINECTOMY;  Surgeon: James E Nitka, MD;  [facility]: [facility];  Service: Orthopedics;  Laterality: N/A;  Central laminectomy L2-3, L3-4, L4-5  . PACEMAKER INSERTION  10/08/14   MDT Advisa MRI compatible dual chamber pacemaker implanted by Dr Klein for syncope with post-termination pauses  . PERCUTANEOUS CORONARY STENT INTERVENTION (PCI-S)  11/09/2014   des to lad & distal circumflex         Dr  Ganji  . PERIPHERAL VASCULAR BALLOON ANGIOPLASTY Right 11/11/2019   Procedure: PERIPHERAL VASCULAR BALLOON ANGIOPLASTY;  Surgeon: Clark, Christopher J, MD;  [facility];  Service: Cardiovascular;  Laterality: Right;  below knee popliteal, tibioperoneal trunk, posterior tibial arteries  . PERIPHERAL VASCULAR CATHETERIZATION N/A 11/08/2015   Procedure: Abdominal Aortogram;  Surgeon: Vance W Brabham, MD;  [facility];  Service: Cardiovascular;   Laterality: N/A;  . PERIPHERAL VASCULAR CATHETERIZATION Right 11/08/2015   Procedure: Peripheral Vascular Atherectomy;  Surgeon: Vance W Brabham, MD;  [facility];  Service: Cardiovascular;  Laterality: Right;  . PERIPHERAL VASCULAR CATHETERIZATION N/A 10/10/2016   Procedure: Lower Extremity Angiography;  Surgeon: Brandon Christopher Cain, MD;  [facility];  Service: Cardiovascular;  Laterality: N/A;  . PERIPHERAL VASCULAR CATHETERIZATION Right 10/10/2016   Procedure: Peripheral Vascular Atherectomy;  Surgeon: Brandon Christopher Cain, MD;  [facility];  Service: Cardiovascular;  Laterality: Right;  Popliteal  . PERMANENT PACEMAKER INSERTION N/A 10/08/2014   Procedure: PERMANENT PACEMAKER INSERTION;  Surgeon: Steven C Klein, MD;  [facility];  Service: Cardiovascular;  Laterality: N/A;  . TEMPORARY PACEMAKER INSERTION Bilateral 10/07/2014   Procedure: TEMPORARY PACEMAKER INSERTION;  Surgeon: Jagadeesh R Ganji, MD;  [facility];  Service: Cardiovascular;  Laterality: Bilateral;  . WOUND DEBRIDEMENT Right 02/19/2020   Procedure: DEBRIDEMENT WOUND RIGHT FOOT;  Surgeon: Dickson, [name], MD;  [facility];  Service: Vascular;  Laterality: Right;    Prior to Admission medications   Medication Sig Start Date End Date Taking? Authorizing Provider  apixaban (ELIQUIS) 5 MG TABS tablet Take 1 tablet (5 mg total) by mouth 2 (two) times daily. DO NOT TAKE THIS MEDICATION UNTIL WE RESTART IT AFTER YOUR SURGERY. 02/16/20  Yes Rhyne, Samantha J, PA-C  aspirin 81 MG EC tablet Take 81 mg by mouth daily.    Yes [provider]  atorvastatin (LIPITOR) 20 MG tablet Take 1 tablet by mouth once daily Patient taking differently: Take 20 mg by mouth daily.  12/24/19  Yes Kelley, Ashton Haynes, NP  furosemide (LASIX) 40 MG tablet Take 40 mg by mouth 2 (two) times daily with a meal.    Yes [provider]  hydrALAZINE  (APRESOLINE) 100 MG   tablet TAKE 1 TABLET BY MOUTH THREE TIMES DAILY Patient taking differently: Take 100 mg by mouth 2 (two) times daily.  02/24/20  Yes Ganji, Jay, MD  insulin NPH-regular Human (70-30) 100 UNIT/ML injection Inject 46-56 Units into the skin daily with breakfast.   Yes [provider]  lisinopril (ZESTRIL) 10 MG tablet Take 1 tablet (10 mg total) by mouth daily. 02/16/20  Yes Rhyne, Samantha J, PA-C  metoprolol tartrate (LOPRESSOR) 100 MG tablet Take 1 tablet by mouth twice daily 05/13/20  Yes Tolia, Sunit, DO  prazosin (MINIPRESS) 2 MG capsule TAKE 1 CAPSULE BY MOUTH IN THE EVENING AFTER SUPPER Patient taking differently: Take 2 mg by mouth daily after supper.  02/24/20  Yes Ganji, Jay, MD  ACCU-CHEK FASTCLIX LANCETS MISC Check blood sugar TID & QHS 10/01/14   Advani, Deepak, MD  blood glucose meter kit and supplies KIT Check blood sugar TID & QHS 02/10/15   Advani, Deepak, MD  Blood Glucose Monitoring Suppl (ACCU-CHEK ADVANTAGE DIABETES) kit Use as instructed 10/01/14   Advani, Deepak, MD  glucose blood (ACCU-CHEK AVIVA PLUS) test strip Use as instructed 10/01/14   Advani, Deepak, MD  glucose blood (CHOICE DM FORA G20 TEST STRIPS) test strip Check blood sugar TID & QHS 02/10/15   Advani, Deepak, MD  glucose blood test strip Use as instructed 11/20/13   Abrol, Nayana, MD  Multiple Vitamin (MULTIVITAMIN WITH MINERALS) TABS tablet Take 1 tablet by mouth daily. Patient not taking: Reported on 05/23/2020    [provider]  oxyCODONE (OXY IR/ROXICODONE) 5 MG immediate release tablet Take 1-2 tablets (5-10 mg total) by mouth every 4 (four) hours as needed for moderate pain (pain score 4-6). Patient not taking: Reported on 05/23/2020 03/16/20   Persons, Mary Anne, PA    No current facility-administered medications for this encounter.   Current Outpatient Medications  Medication Sig Dispense Refill  . apixaban (ELIQUIS) 5 MG TABS tablet Take 1 tablet (5 mg total) by mouth 2 (two)  times daily. DO NOT TAKE THIS MEDICATION UNTIL WE RESTART IT AFTER YOUR SURGERY.    . aspirin 81 MG EC tablet Take 81 mg by mouth daily.     . atorvastatin (LIPITOR) 20 MG tablet Take 1 tablet by mouth once daily (Patient taking differently: Take 20 mg by mouth daily. ) 90 tablet 2  . furosemide (LASIX) 40 MG tablet Take 40 mg by mouth 2 (two) times daily with a meal.     . hydrALAZINE (APRESOLINE) 100 MG tablet TAKE 1 TABLET BY MOUTH THREE TIMES DAILY (Patient taking differently: Take 100 mg by mouth 2 (two) times daily. ) 270 tablet 0  . insulin NPH-regular Human (70-30) 100 UNIT/ML injection Inject 46-56 Units into the skin daily with breakfast.    . lisinopril (ZESTRIL) 10 MG tablet Take 1 tablet (10 mg total) by mouth daily.    . metoprolol tartrate (LOPRESSOR) 100 MG tablet Take 1 tablet by mouth twice daily 180 tablet 0  . prazosin (MINIPRESS) 2 MG capsule TAKE 1 CAPSULE BY MOUTH IN THE EVENING AFTER SUPPER (Patient taking differently: Take 2 mg by mouth daily after supper. ) 90 capsule 0  . ACCU-CHEK FASTCLIX LANCETS MISC Check blood sugar TID & QHS 102 each 2  . blood glucose meter kit and supplies KIT Check blood sugar TID & QHS 1 each 0  . Blood Glucose Monitoring Suppl (ACCU-CHEK ADVANTAGE DIABETES) kit Use as instructed 1 each 0  . glucose blood (ACCU-CHEK AVIVA PLUS) test   strip Use as instructed 100 each 12  . glucose blood (CHOICE DM FORA G20 TEST STRIPS) test strip Check blood sugar TID & QHS 100 each 12  . glucose blood test strip Use as instructed 100 each 12  . Multiple Vitamin (MULTIVITAMIN WITH MINERALS) TABS tablet Take 1 tablet by mouth daily. (Patient not taking: Reported on 05/23/2020)    . oxyCODONE (OXY IR/ROXICODONE) 5 MG immediate release tablet Take 1-2 tablets (5-10 mg total) by mouth every 4 (four) hours as needed for moderate pain (pain score 4-6). (Patient not taking: Reported on 05/23/2020) 30 tablet 0    Allergies as of 05/23/2020 - Review Complete 05/23/2020    Allergen Reaction Noted  . Keflex [cephalexin] Nausea And Vomiting 12/25/2019  . Pletal [cilostazol] Palpitations 09/18/2017  . Sulfa antibiotics Rash 08/14/2013    Family History  Problem Relation Age of Onset  . Diabetes type II Mother   . Hypertension Mother   . Diabetes Mother   . Liver cancer Father   . Diabetes type II Sister   . Breast cancer Sister   . Diabetes Sister   . Hypertension Sister   . Diabetes type II Brother   . Kidney failure Brother   . Diabetes Brother   . Hypertension Brother   . Diabetes type II Sister     Social History   Socioeconomic History  . Marital status: Married    Spouse name: Not on file  . Number of children: 2  . Years of education: Not on file  . Highest education level: Not on file  Occupational History  . Occupation: Disabled  Tobacco Use  . Smoking status: Former Smoker    Packs/day: 1.50    Years: 30.00    Pack years: 45.00    Types: Cigars    Quit date: 10/07/2014    Years since quitting: 5.6  . Smokeless tobacco: Never Used  Vaping Use  . Vaping Use: Never used  Substance and Sexual Activity  . Alcohol use: Yes    Alcohol/week: 6.0 standard drinks    Types: 6 Cans of beer per week    Comment: 2-3 beers daily  . Drug use: No  . Sexual activity: Not on file  Other Topics Concern  . Not on file  Social History Narrative  . Not on file   Social Determinants of Health   Financial Resource Strain:   . Difficulty of Paying Living Expenses:   Food Insecurity:   . Worried About Running Out of Food in the Last Year:   . Ran Out of Food in the Last Year:   Transportation Needs:   . Lack of Transportation (Medical):   . Lack of Transportation (Non-Medical):   Physical Activity:   . Days of Exercise per Week:   . Minutes of Exercise per Session:   Stress:   . Feeling of Stress :   Social Connections:   . Frequency of Communication with Friends and Family:   . Frequency of Social Gatherings with Friends and  Family:   . Attends Religious Services:   . Active Member of Clubs or Organizations:   . Attends Club or Organization Meetings:   . Marital Status:   Intimate Partner Violence:   . Fear of Current or Ex-Partner:   . Emotionally Abused:   . Physically Abused:   . Sexually Abused:     Review of Systems: All systems reviewed and negative except where noted in HPI.  Physical Exam: Vital signs in last   24 hours: Temp:  [97.7 F (36.5 C)-98.1 F (36.7 C)] 97.7 F (36.5 C) (07/12 1545) Pulse Rate:  [60-89] 61 (07/12 1600) Resp:  [12-18] 13 (07/12 1600) BP: (172-202)/(70-84) 186/76 (07/12 1600) SpO2:  [13 %-99 %] 97 % (07/12 1600) Weight:  [122.5 kg] 122.5 kg (07/12 1251)   General:   Alert, well-developed,  male in NAD Psych:  Pleasant, cooperative. Normal mood and affect. Eyes:  Pupils equal, sclera clear, no icterus.   Conjunctiva pink. Ears:  Normal auditory acuity. Nose:  No deformity, discharge,  or lesions. Neck:  Supple; no masses Lungs:  Clear throughout to auscultation.   No wheezes, crackles, or rhonchi.  Heart:  Regular rate and rhythm; no murmurs, no lower extremity edema Abdomen:  Soft, non-distended, nontender, BS active, no palp mass   Rectal:  No repeat. Stool dark, heme + on EDP exam Msk:  Right BKA, partial amputation left foot. Neurologic:  Alert and  oriented x4;  grossly normal neurologically. Skin:  Intact without significant lesions or rashes.   Intake/Output from previous day: No intake/output data recorded. Intake/Output this shift: No intake/output data recorded.  Lab Results: Recent Labs    05/23/20 1319  WBC 5.7  HGB 11.5*  HCT 35.9*  PLT 143*   BMET Recent Labs    05/23/20 1319  NA 135  K 5.8*  CL 105  CO2 16*  GLUCOSE 142*  BUN 97*  CREATININE 6.78*  CALCIUM 8.4*   LFT Recent Labs    05/23/20 1319  PROT 6.9  ALBUMIN 3.5  AST 14*  ALT 20  ALKPHOS 105  BILITOT 0.3   PT/INR No results for input(s): LABPROT, INR in the  last 72 hours. Hepatitis Panel No results for input(s): HEPBSAG, HCVAB, HEPAIGM, HEPBIGM in the last 72 hours.   . CBC Latest Ref Rng & Units 05/23/2020 05/12/2020 05/05/2020  WBC 4.0 - 10.5 K/uL 5.7 - -  Hemoglobin 13.0 - 17.0 g/dL 11.5(L) 12.2(L) 11.8(L)  Hematocrit 39 - 52 % 35.9(L) - -  Platelets 150 - 400 K/uL 143(L) - -    . CMP Latest Ref Rng & Units 05/23/2020 03/11/2020 02/20/2020  Glucose 70 - 99 mg/dL 142(H) 317(H) 298(H)  BUN 8 - 23 mg/dL 97(H) 78(H) 69(H)  Creatinine 0.61 - 1.24 mg/dL 6.78(H) 2.51(H) 2.97(H)  Sodium 135 - 145 mmol/L 135 133(L) 133(L)  Potassium 3.5 - 5.1 mmol/L 5.8(H) 4.7 5.0  Chloride 98 - 111 mmol/L 105 103 100  CO2 22 - 32 mmol/L 16(L) 19(L) 21(L)  Calcium 8.9 - 10.3 mg/dL 8.4(L) 8.6(L) 8.4(L)  Total Protein 6.5 - 8.1 g/dL 6.9 - -  Total Bilirubin 0.3 - 1.2 mg/dL 0.3 - -  Alkaline Phos 38 - 126 U/L 105 - -  AST 15 - 41 U/L 14(L) - -  ALT 0 - 44 U/L 20 - -   Studies/Results: No results found.  Active Problems:   * No active hospital problems. *    Paula Guenther, NP-C @  05/23/2020, 4:35 PM     

## 2020-05-23 NOTE — ED Notes (Signed)
Per Dr.Nanavati, orthostatic v/s do not need to be obtained due to pt having previous amputations to lower extremities.

## 2020-05-23 NOTE — ED Provider Notes (Addendum)
[facility] DEPT Provider Note   CSN: [id number] Arrival date & time: 05/23/20  1247     History Chief Complaint  Patient presents with  . GI Bleeding    Alexander [name] is a 59 y.o. male.  HPI    59 year old male with history of atrial fibrillation on Eliquis, CAD, CKD, diabetes comes in a chief complaint of dark stools.  Patient reports that over the last 2 days he has had dark stools.  He had called his PCP and he was advised to come to the ER to rule out any bleeding.  Patient has had about 3-4 BMs in the last 2 days.  He is not taking any iron or Pepto-Bismol right now.  He denies any abdominal pain.  There is no history of GI bleed.  Past Medical History:  Diagnosis Date  . Alcohol abuse   . Anemia   . Arthritis    "patient does not think so."  . Atrial fibrillation ([place])   . Cardiac syncope 10/07/14   rapid A fib with 8 sec pauses on converison with syncope- temp pacing wire placed then PPM  . Cataract    BILATERAL  . Chronic kidney disease   . Coronary artery disease   . Diabetes mellitus    dx---been  awhile  . Encounter for care of pacemaker 10/08/2014  . History of blood transfusion   . Hyperlipemia   . Hypertension   . Osteomyelitis ([place])    right foot  . Peripheral vascular disease ([place])   . Presence of permanent cardiac pacemaker 10/08/2014  . Sinus node dysfunction ([place]) 10/08/2014    Patient Active Problem List   Diagnosis Date Noted  . Acute hematogenous osteomyelitis of right foot ([place]) 03/11/2020  . Gangrene of right foot ([place])   . Diabetic foot ulcer associated with type 2 diabetes mellitus ([place]) 02/19/2020  . Heel ulcer ([place]) 11/05/2019  . Atherosclerotic PVD with ulceration ([place]) 09/09/2017  . Essential hypertension, benign 02/07/2015  . Renal insufficiency 02/07/2015  . Post PTCA 11/09/2014  . S/P PTCA (percutaneous transluminal coronary angioplasty) 11/09/2014  . Pacemaker: [place] DR MRI [id number]  Dual chamber pacemaker 10/08/2014 10/08/2014  . Encounter for care of pacemaker 10/08/2014  . Sinus node dysfunction ([place]) 10/08/2014  . Cardiac asystole ([place]) 10/07/2014  . Cardiac syncope 10/07/2014  . Diabetes mellitus with stage 3 chronic kidney disease ([place]) 10/07/2014  . CAD (coronary artery disease), native coronary artery 10/07/2014  . Diabetes mellitus due to underlying condition without complications ([place]) 23/76/2831  . Anemia, unspecified 05/25/2014  . Syncope 04/30/2014  . Atherosclerosis of native arteries of the extremities with gangrene ([place]) 12/16/2013  . Volume overload 10/30/2013  . Acute renal failure ([place]) 10/28/2013  . PAD (peripheral artery disease) ([place]) 10/27/2013  . Leukocytosis 10/21/2013  . Anemia 10/21/2013  . Osteomyelitis of left foot ([place]) 08/19/2013  . Spinal stenosis, lumbar region, with neurogenic claudication 06/12/2012    Class: Diagnosis of  . DIABETES MELLITUS, TYPE II 03/15/2009  . ALCOHOL ABUSE 03/15/2009  . TOBACCO USE 03/15/2009  . HYPERTENSION 03/15/2009    Past Surgical History:  Procedure Laterality Date  . ABDOMINAL AORTOGRAM W/LOWER EXTREMITY N/A 09/09/2017   Procedure: ABDOMINAL AORTOGRAM W/LOWER EXTREMITY;  Surgeon: Angelia Mould, MD;  Location: [facility];  Service: Cardiovascular;  Laterality: N/A;  . ABDOMINAL AORTOGRAM W/LOWER EXTREMITY Right 11/11/2019   Procedure: ABDOMINAL AORTOGRAM W/LOWER EXTREMITY;  Surgeon: Marty Heck, MD;  Location: [facility];  Service: Cardiovascular;  Laterality: Right;  . AMPUTATION Left 08/19/2013   Procedure: AMPUTATION RAY;  Surgeon: Alta Corning, MD;  Location: [facility];  Service: Orthopedics;  Laterality: Left;  ray amputation left 5th  . AMPUTATION Left 10/27/2013   Procedure: AMPUTATION DIGIT-LEFT 4TH TOE, 4th and 5th metatarsal.;  Surgeon: Angelia Mould, MD;  Location: [facility];  Service: Vascular;  Laterality: Left;  . AMPUTATION Left 11/04/2013    Procedure: LEFT FOOT TRANS-METATARSAL AMPUTATION WITH WOUND CLOSURE ;  Surgeon: Alta Corning, MD;  Location: [facility];  Service: Orthopedics;  Laterality: Left;  . AMPUTATION Right 09/10/2017   Procedure: RIGHT FOURTH AND FIFTH TOE AMPUTATION;  Surgeon: Angelia Mould, MD;  Location: [facility];  Service: Vascular;  Laterality: Right;  . AMPUTATION Right 02/19/2020   Procedure: AMPUTATION RIGHT 3RD TOE;  Surgeon: Angelia Mould, MD;  Location: [facility];  Service: Vascular;  Laterality: Right;  . AMPUTATION Right 03/11/2020   Procedure: RIGHT BELOW KNEE AMPUTATION;  Surgeon: Newt Minion, MD;  Location: [facility];  Service: Orthopedics;  Laterality: Right;  . APPLICATION OF WOUND VAC Right 02/19/2020   Procedure: Application Of Wound Vac Right foot;  Surgeon: Angelia Mould, MD;  Location: [facility];  Service: Vascular;  Laterality: Right;  . CARDIAC CATHETERIZATION  10/07/2014   Procedure: LEFT HEART CATH AND CORONARY ANGIOGRAPHY;  Surgeon: Laverda Page, MD;  Location: [facility] CATH LAB;  Service: Cardiovascular;;  . COLONOSCOPY W/ POLYPECTOMY    . EMBOLECTOMY Right 09/12/2017   Procedure: Thrombectomy  & Redo Right Below Knee Popliteal Artery Bypass Graft  ;  Surgeon: Waynetta Sandy, MD;  Location: [facility];  Service: Vascular;  Laterality: Right;  . EYE SURGERY Bilateral    cataract  . FEMORAL-TIBIAL BYPASS GRAFT Left 10/27/2013   Procedure: BYPASS GRAFT LEFT FEMORAL- POSTERIOR TIBIAL ARTERY;  Surgeon: Angelia Mould, MD;  Location: [facility];  Service: Vascular;  Laterality: Left;  . FEMORAL-TIBIAL BYPASS GRAFT Right 09/10/2017   Procedure: RIGHT SUPERFICIAL  FEMORAL ARTERY-BELOW KNEE POPLITEAL ARTERY BYPASS GRAFT WITH VEIN;  Surgeon: Angelia Mould, MD;  Location: [facility];  Service: Vascular;  Laterality: Right;  . I & D EXTREMITY Left 10/27/2013   Procedure: IRRIGATION AND DEBRIDEMENT EXTREMITY- LEFT FOOT;  Surgeon: Angelia Mould, MD;  Location: [facility];  Service:  Vascular;  Laterality: Left;  . I & D EXTREMITY Right 01/26/2020   Procedure: IRRIGATION AND DEBRIDEMENT EXTREMITY RIGHT FOOT WOUND;  Surgeon: Angelia Mould, MD;  Location: [facility];  Service: Vascular;  Laterality: Right;  . INTRAOPERATIVE ARTERIOGRAM Right 09/10/2017   Procedure: INTRA OPERATIVE ARTERIOGRAM;  Surgeon: Angelia Mould, MD;  Location: [facility];  Service: Vascular;  Laterality: Right;  . IR ANGIOGRAM FOLLOW UP STUDY  09/09/2017  . LEFT HEART CATHETERIZATION WITH CORONARY ANGIOGRAM N/A 11/09/2014   Procedure: LEFT HEART CATHETERIZATION WITH CORONARY ANGIOGRAM;  Surgeon: Laverda Page, MD;  Location: [facility] CATH LAB;  Service: Cardiovascular;  Laterality: N/A;  . LOWER EXTREMITY ANGIOGRAM Right 11/08/2015   Procedure: Lower Extremity Angiogram;  Surgeon: Serafina Mitchell, MD;  Location: [facility];  Service: Cardiovascular;  Laterality: Right;  . LUMBAR LAMINECTOMY  06/13/2012   Procedure: MICRODISCECTOMY LUMBAR LAMINECTOMY;  Surgeon: Jessy Oto, MD;  Location: [facility];  Service: Orthopedics;  Laterality: N/A;  Central laminectomy L2-3, L3-4, L4-5  . PACEMAKER INSERTION  10/08/14   MDT [employer]a MRI compatible dual chamber pacemaker implanted by Dr Caryl Comes  for syncope with post-termination pauses  . PERCUTANEOUS CORONARY STENT INTERVENTION (PCI-S)  11/09/2014   des to lad & distal circumflex         Dr  [name]  . PERIPHERAL VASCULAR BALLOON ANGIOPLASTY Right 11/11/2019   Procedure: PERIPHERAL VASCULAR BALLOON ANGIOPLASTY;  Surgeon: Marty Heck, MD;  Location: [facility];  Service: Cardiovascular;  Laterality: Right;  below knee popliteal, tibioperoneal trunk, posterior tibial arteries  . PERIPHERAL VASCULAR CATHETERIZATION N/A 11/08/2015   Procedure: Abdominal Aortogram;  Surgeon: Serafina Mitchell, MD;  Location: [facility];  Service: Cardiovascular;  Laterality: N/A;  . PERIPHERAL VASCULAR CATHETERIZATION Right 11/08/2015   Procedure: Peripheral  Vascular Atherectomy;  Surgeon: Serafina Mitchell, MD;  Location: [facility];  Service: Cardiovascular;  Laterality: Right;  . PERIPHERAL VASCULAR CATHETERIZATION N/A 10/10/2016   Procedure: Lower Extremity Angiography;  Surgeon: Waynetta Sandy, MD;  Location: [facility];  Service: Cardiovascular;  Laterality: N/A;  . PERIPHERAL VASCULAR CATHETERIZATION Right 10/10/2016   Procedure: Peripheral Vascular Atherectomy;  Surgeon: Waynetta Sandy, MD;  Location: [facility];  Service: Cardiovascular;  Laterality: Right;  Popliteal  . PERMANENT PACEMAKER INSERTION N/A 10/08/2014   Procedure: PERMANENT PACEMAKER INSERTION;  Surgeon: Deboraha Sprang, MD;  Location: [facility] CATH LAB;  Service: Cardiovascular;  Laterality: N/A;  . TEMPORARY PACEMAKER INSERTION Bilateral 10/07/2014   Procedure: TEMPORARY PACEMAKER INSERTION;  Surgeon: Laverda Page, MD;  Location: [facility] CATH LAB;  Service: Cardiovascular;  Laterality: Bilateral;  . WOUND DEBRIDEMENT Right 02/19/2020   Procedure: DEBRIDEMENT WOUND RIGHT FOOT;  Surgeon: Angelia Mould, MD;  Location: [facility] OR;  Service: Vascular;  Laterality: Right;       Family History  Problem Relation Age of Onset  . Diabetes type II Mother   . Hypertension Mother   . Diabetes Mother   . Liver cancer Father   . Diabetes type II Sister   . Breast cancer Sister   . Diabetes Sister   . Hypertension Sister   . Diabetes type II Brother   . Kidney failure Brother   . Diabetes Brother   . Hypertension Brother   . Diabetes type II Sister     Social History   Tobacco Use  . Smoking status: Former Smoker    Packs/day: 1.50    Years: 30.00    Pack years: 45.00    Types: Cigars    Quit date: 10/07/2014    Years since quitting: 5.6  . Smokeless tobacco: Never Used  Vaping Use  . Vaping Use: Never used  Substance Use Topics  . Alcohol use: Yes    Alcohol/week: 6.0 standard drinks    Types: 6 Cans of beer per week     Comment: 2-3 beers daily  . Drug use: No    Home Medications Prior to Admission medications   Medication Sig Start Date End Date Taking? Authorizing Provider  apixaban (ELIQUIS) 5 MG TABS tablet Take 1 tablet (5 mg total) by mouth 2 (two) times daily. DO NOT TAKE THIS MEDICATION UNTIL WE RESTART IT AFTER YOUR SURGERY. 02/16/20  Yes Rhyne, Hulen Shouts, PA-C  aspirin 81 MG EC tablet Take 81 mg by mouth daily.    Yes [provider]  atorvastatin (LIPITOR) 20 MG tablet Take 1 tablet by mouth once daily Patient taking differently: Take 20 mg by mouth daily.  12/24/19  Yes Miquel Dunn, NP  furosemide (LASIX) 40 MG tablet Take 40 mg by  mouth 2 (two) times daily with a meal.    Yes [provider]  hydrALAZINE (APRESOLINE) 100 MG tablet TAKE 1 TABLET BY MOUTH THREE TIMES DAILY Patient taking differently: Take 100 mg by mouth 2 (two) times daily.  02/24/20  Yes Adrian Prows, MD  insulin NPH-regular Human (70-30) 100 UNIT/ML injection Inject 46-56 Units into the skin daily with breakfast.   Yes [provider]  lisinopril (ZESTRIL) 10 MG tablet Take 1 tablet (10 mg total) by mouth daily. 02/16/20  Yes Rhyne, Samantha J, PA-C  metoprolol tartrate (LOPRESSOR) 100 MG tablet Take 1 tablet by mouth twice daily 05/13/20  Yes Tolia, Sunit, DO  prazosin (MINIPRESS) 2 MG capsule TAKE 1 CAPSULE BY MOUTH IN THE EVENING AFTER SUPPER Patient taking differently: Take 2 mg by mouth daily after supper.  02/24/20  Yes Adrian Prows, MD  ACCU-CHEK FASTCLIX LANCETS MISC Check blood sugar TID & QHS 10/01/14   Lorayne Marek, MD  blood glucose meter kit and supplies KIT Check blood sugar TID & QHS 02/10/15   Advani, Vernon Prey, MD  Blood Glucose Monitoring Suppl (ACCU-CHEK ADVANTAGE DIABETES) kit Use as instructed 10/01/14   Lorayne Marek, MD  glucose blood (ACCU-CHEK AVIVA PLUS) test strip Use as instructed 10/01/14   Advani, Vernon Prey, MD  glucose blood (CHOICE DM FORA G20 TEST STRIPS) test strip Check  blood sugar TID & QHS 02/10/15   Advani, Vernon Prey, MD  glucose blood test strip Use as instructed 11/20/13   Reyne Dumas, MD  Multiple Vitamin (MULTIVITAMIN WITH MINERALS) TABS tablet Take 1 tablet by mouth daily. Patient not taking: Reported on 05/23/2020    [provider]  oxyCODONE (OXY IR/ROXICODONE) 5 MG immediate release tablet Take 1-2 tablets (5-10 mg total) by mouth every 4 (four) hours as needed for moderate pain (pain score 4-6). Patient not taking: Reported on 05/23/2020 03/16/20   Persons, Bevely Palmer, [place]    Allergies    Keflex [cephalexin], Pletal [cilostazol], and Sulfa antibiotics  Review of Systems   Review of Systems  Constitutional: Positive for activity change.  Respiratory: Negative for shortness of breath.   Cardiovascular: Negative for chest pain.  Gastrointestinal: Positive for blood in stool. Negative for nausea and vomiting.  Neurological: Negative for dizziness.  Hematological: Bruises/bleeds easily.  All other systems reviewed and are negative.   Physical Exam Updated Vital Signs BP (!) 186/76   Pulse 61   Temp 97.7 F (36.5 C) (Oral)   Resp 13   Ht 6' (1.829 m)   Wt 122.5 kg   SpO2 97%   BMI 36.62 kg/m   Physical Exam Vitals and nursing note reviewed.  Constitutional:      Appearance: He is well-developed.  HENT:     Head: Atraumatic.  Eyes:     Extraocular Movements: Extraocular movements intact.     Pupils: Pupils are equal, round, and reactive to light.  Cardiovascular:     Rate and Rhythm: Normal rate.  Pulmonary:     Effort: Pulmonary effort is normal.  Abdominal:     General: There is no distension.     Palpations: Abdomen is soft.     Tenderness: There is no abdominal tenderness.     Comments: Digital rectal exam revealed dark stools, not consistent with melena.  Musculoskeletal:     Cervical back: Neck supple.  Skin:    General: Skin is warm.  Neurological:     Mental Status: He is alert and oriented to person, place, and  time.  ED Results / Procedures / Treatments   Labs (all labs ordered are listed, but only abnormal results are displayed) Labs Reviewed  COMPREHENSIVE METABOLIC PANEL - Abnormal; Notable for the following components:      Result Value   Potassium 5.8 (*)    CO2 16 (*)    Glucose, Bld 142 (*)    BUN 97 (*)    Creatinine, Ser 6.78 (*)    Calcium 8.4 (*)    AST 14 (*)    GFR calc non Af Amer 8 (*)    GFR calc Af Amer 9 (*)    All other components within normal limits  CBC - Abnormal; Notable for the following components:   RBC 4.03 (*)    Hemoglobin 11.5 (*)    HCT 35.9 (*)    Platelets 143 (*)    All other components within normal limits  CBG MONITORING, ED - Abnormal; Notable for the following components:   Glucose-Capillary 172 (*)    All other components within normal limits  POC OCCULT BLOOD, ED - Abnormal; Notable for the following components:   Fecal Occult Bld POSITIVE (*)    All other components within normal limits  SARS CORONAVIRUS 2 BY RT PCR (HOSPITAL ORDER, [facility])  TYPE AND SCREEN    EKG None  Radiology No results found.  Procedures Procedures (including critical care time)  Medications Ordered in ED Medications - No data to display  ED Course  I have reviewed the triage vital signs and the nursing notes.  Pertinent labs & imaging results that were available during my care of the patient were reviewed by me and considered in my medical decision making (see chart for details).  Clinical Course as of May 23 1648  Mon May 23, 2020  1541 Patient's stools are occult positive.  His hemoglobin is almost at baseline. Given multiple medical comorbidities and the fact that he is on blood thinners, we will admit him.  Fecal Occult Blood, POC(!): POSITIVE [AN]  1600 Patient has elevated creatinine. With acute on renal failure, and questionable bleed, we will recommend admission. Patient prefers going home.  Creatinine(!):  6.78 [AN]  1649 I discussed the case with the GI service Nevin Bloodgood). [facility] will see the patient. PT recommended patient be admitted.   [AN]    Clinical Course User Index [AN] Varney Biles, MD   MDM Rules/Calculators/A&P                          59 year old male comes in a chief complaint of dark stools.  He reports that they were not tarry or sticky.  He has no history of GI bleed.  Patient has multiple medical comorbidities including A. fib for which he is on blood thinners.  He has no abdominal tenderness on exam and the digital rectal exam was positive for dark stools that are not consistent with melena.  Appropriate labs sent.  Patient is not having any symptoms of poor perfusion.  Final Clinical Impression(s) / ED Diagnoses Final diagnoses:  Lower GI bleed  Acute renal failure, unspecified acute renal failure type [facility])    Rx / DC Orders ED Discharge Orders    None       Varney Biles, MD 05/23/20 [place], Luberta Grabinski, MD 05/23/20 1650

## 2020-05-23 NOTE — Consult Note (Signed)
Alexander [name] Admit Date: 05/23/2020 05/23/2020 Alexander [name] Requesting Physician:  Lupita Leash MD  Reason for Consult:  AoCKD HPI:  8M PMH including PVD s/p recent R BKA, DM2, HTN on lisinopriol, AFib on apixaban, CAD on ASA, s/p PPM presented to ED earlier today with several days of black tarry stools, FOBT+ in ED.  Also has CKD4, sees [place] at [facility] but no eval since 12/2018, rec ESA For anemia of CKD.  Recent BL SCr 2.5-3.0.    In ED SCr 6.8, K 5.8, HCO3 16 with AG 14.  BUN 97.  Pt denies NSAID use. No recent new meds, no antibiotics. No inc in ACEi.  No sig LEE, no sig LUTS.    No imaging since admission.  Rec lokelma for hyperK.  BPs elevated, says has missed some of his meds recently.   Hb stable 11.3. GI has seen for EGD.    Creat (mg/dL)  Date Value  02/07/2015 1.52 (H)  11/20/2013 2.42 (H)   Creatinine, Ser (mg/dL)  Date Value  05/23/2020 6.78 (H)  03/11/2020 2.51 (H)  02/20/2020 2.97 (H)  02/19/2020 2.60 (H)  02/15/2020 2.79 (H)  01/28/2020 3.01 (H)  01/26/2020 2.86 (H)  11/11/2019 2.80 (H)  09/16/2017 1.68 (H)  09/15/2017 1.92 (H)  ]  ROS NSAIDS: denies use IV Contrast no exposure TMP/SMX denies exposure Hypotension not present Balance of 12 systems is negative w/ exceptions as above  PMH  Past Medical History:  Diagnosis Date  . Alcohol abuse   . Anemia   . Arthritis    "patient does not think so."  . Atrial fibrillation ([place])   . Cardiac syncope 10/07/14   rapid A fib with 8 sec pauses on converison with syncope- temp pacing wire placed then PPM  . Cataract    BILATERAL  . Chronic kidney disease   . Coronary artery disease   . Diabetes mellitus    dx---been  awhile  . Encounter for care of pacemaker 10/08/2014  . History of blood transfusion   . Hyperlipemia   . Hypertension   . Osteomyelitis ([place])    right foot  . Peripheral vascular disease ([place])   . Presence of permanent cardiac pacemaker 10/08/2014  . Sinus node dysfunction ([place]) 10/08/2014    PSH  Past Surgical History:  Procedure Laterality Date  . ABDOMINAL AORTOGRAM W/LOWER EXTREMITY N/A 09/09/2017   Procedure: ABDOMINAL AORTOGRAM W/LOWER EXTREMITY;  Surgeon: Angelia Mould, MD;  Location: [place] [facility];  Service: Cardiovascular;  Laterality: N/A;  . ABDOMINAL AORTOGRAM W/LOWER EXTREMITY Right 11/11/2019   Procedure: ABDOMINAL AORTOGRAM W/LOWER EXTREMITY;  Surgeon: Marty Heck, MD;  Location: [place] [facility];  Service: Cardiovascular;  Laterality: Right;  . AMPUTATION Left 08/19/2013   Procedure: AMPUTATION RAY;  Surgeon: Alta Corning, MD;  Location: [place];  Service: Orthopedics;  Laterality: Left;  ray amputation left 5th  . AMPUTATION Left 10/27/2013   Procedure: AMPUTATION DIGIT-LEFT 4TH TOE, 4th and 5th metatarsal.;  Surgeon: Angelia Mould, MD;  Location: [place];  Service: Vascular;  Laterality: Left;  . AMPUTATION Left 11/04/2013   Procedure: LEFT FOOT TRANS-METATARSAL AMPUTATION WITH WOUND CLOSURE ;  Surgeon: Alta Corning, MD;  Location: [place];  Service: Orthopedics;  Laterality: Left;  . AMPUTATION Right 09/10/2017   Procedure: RIGHT FOURTH AND FIFTH TOE AMPUTATION;  Surgeon: Angelia Mould, MD;  Location: [place];  Service: Vascular;  Laterality: Right;  . AMPUTATION Right 02/19/2020   Procedure: AMPUTATION RIGHT  3RD TOE;  Surgeon: Angelia Mould, MD;  Location: [place];  Service: Vascular;  Laterality: Right;  . AMPUTATION Right 03/11/2020   Procedure: RIGHT BELOW KNEE AMPUTATION;  Surgeon: Newt Minion, MD;  Location: [place];  Service: Orthopedics;  Laterality: Right;  . APPLICATION OF WOUND VAC Right 02/19/2020   Procedure: Application Of Wound Vac Right foot;  Surgeon: Angelia Mould, MD;  Location: [place];  Service: Vascular;  Laterality: Right;  . CARDIAC CATHETERIZATION  10/07/2014   Procedure: LEFT HEART CATH AND CORONARY ANGIOGRAPHY;  Surgeon: Laverda Page, MD;  Location: [facility] CATH LAB;  Service:  Cardiovascular;;  . COLONOSCOPY W/ POLYPECTOMY    . EMBOLECTOMY Right 09/12/2017   Procedure: Thrombectomy  & Redo Right Below Knee Popliteal Artery Bypass Graft  ;  Surgeon: Waynetta Sandy, MD;  Location: [place];  Service: Vascular;  Laterality: Right;  . EYE SURGERY Bilateral    cataract  . FEMORAL-TIBIAL BYPASS GRAFT Left 10/27/2013   Procedure: BYPASS GRAFT LEFT FEMORAL- POSTERIOR TIBIAL ARTERY;  Surgeon: Angelia Mould, MD;  Location: [place];  Service: Vascular;  Laterality: Left;  . FEMORAL-TIBIAL BYPASS GRAFT Right 09/10/2017   Procedure: RIGHT SUPERFICIAL  FEMORAL ARTERY-BELOW KNEE POPLITEAL ARTERY BYPASS GRAFT WITH VEIN;  Surgeon: Angelia Mould, MD;  Location: [place];  Service: Vascular;  Laterality: Right;  . I & D EXTREMITY Left 10/27/2013   Procedure: IRRIGATION AND DEBRIDEMENT EXTREMITY- LEFT FOOT;  Surgeon: Angelia Mould, MD;  Location: [place];  Service: Vascular;  Laterality: Left;  . I & D EXTREMITY Right 01/26/2020   Procedure: IRRIGATION AND DEBRIDEMENT EXTREMITY RIGHT FOOT WOUND;  Surgeon: Angelia Mould, MD;  Location: [place];  Service: Vascular;  Laterality: Right;  . INTRAOPERATIVE ARTERIOGRAM Right 09/10/2017   Procedure: INTRA OPERATIVE ARTERIOGRAM;  Surgeon: Angelia Mould, MD;  Location: [place];  Service: Vascular;  Laterality: Right;  . IR ANGIOGRAM FOLLOW UP STUDY  09/09/2017  . LEFT HEART CATHETERIZATION WITH CORONARY ANGIOGRAM N/A 11/09/2014   Procedure: LEFT HEART CATHETERIZATION WITH CORONARY ANGIOGRAM;  Surgeon: Laverda Page, MD;  Location: [facility] CATH LAB;  Service: Cardiovascular;  Laterality: N/A;  . LOWER EXTREMITY ANGIOGRAM Right 11/08/2015   Procedure: Lower Extremity Angiogram;  Surgeon: Serafina Mitchell, MD;  Location: [place] [facility];  Service: Cardiovascular;  Laterality: Right;  . LUMBAR LAMINECTOMY  06/13/2012   Procedure: MICRODISCECTOMY LUMBAR LAMINECTOMY;  Surgeon: Jessy Oto, MD;  Location: [place];   Service: Orthopedics;  Laterality: N/A;  Central laminectomy L2-3, L3-4, L4-5  . PACEMAKER INSERTION  10/08/14   MDT Advisa MRI compatible dual chamber pacemaker implanted by Dr Caryl Comes for syncope with post-termination pauses  . PERCUTANEOUS CORONARY STENT INTERVENTION (PCI-S)  11/09/2014   des to lad & distal circumflex         Dr  [name]  . PERIPHERAL VASCULAR BALLOON ANGIOPLASTY Right 11/11/2019   Procedure: PERIPHERAL VASCULAR BALLOON ANGIOPLASTY;  Surgeon: Marty Heck, MD;  Location: [place] [facility];  Service: Cardiovascular;  Laterality: Right;  below knee popliteal, tibioperoneal trunk, posterior tibial arteries  . PERIPHERAL VASCULAR CATHETERIZATION N/A 11/08/2015   Procedure: Abdominal Aortogram;  Surgeon: Serafina Mitchell, MD;  Location: [place] [facility];  Service: Cardiovascular;  Laterality: N/A;  . PERIPHERAL VASCULAR CATHETERIZATION Right 11/08/2015   Procedure: Peripheral Vascular Atherectomy;  Surgeon: Serafina Mitchell, MD;  Location: [place] [facility];  Service: Cardiovascular;  Laterality: Right;  . PERIPHERAL VASCULAR CATHETERIZATION N/A  10/10/2016   Procedure: Lower Extremity Angiography;  Surgeon: Waynetta Sandy, MD;  Location: [place] [facility];  Service: Cardiovascular;  Laterality: N/A;  . PERIPHERAL VASCULAR CATHETERIZATION Right 10/10/2016   Procedure: Peripheral Vascular Atherectomy;  Surgeon: Waynetta Sandy, MD;  Location: [place] [facility];  Service: Cardiovascular;  Laterality: Right;  Popliteal  . PERMANENT PACEMAKER INSERTION N/A 10/08/2014   Procedure: PERMANENT PACEMAKER INSERTION;  Surgeon: Deboraha Sprang, MD;  Location: [facility] CATH LAB;  Service: Cardiovascular;  Laterality: N/A;  . TEMPORARY PACEMAKER INSERTION Bilateral 10/07/2014   Procedure: TEMPORARY PACEMAKER INSERTION;  Surgeon: Laverda Page, MD;  Location: [facility] CATH LAB;  Service: Cardiovascular;  Laterality: Bilateral;  . WOUND DEBRIDEMENT Right 02/19/2020   Procedure:  DEBRIDEMENT WOUND RIGHT FOOT;  Surgeon: Angelia Mould, MD;  Location: [facility] OR;  Service: Vascular;  Laterality: Right;   FH  Family History  Problem Relation Age of Onset  . Diabetes type II Mother   . Hypertension Mother   . Diabetes Mother   . Liver cancer Father   . Diabetes type II Sister   . Breast cancer Sister   . Diabetes Sister   . Hypertension Sister   . Diabetes type II Brother   . Kidney failure Brother   . Diabetes Brother   . Hypertension Brother   . Diabetes type II Sister    SH  reports that he quit smoking about 5 years ago. His smoking use included cigars. He has a 45.00 pack-year smoking history. He has never used smokeless tobacco. He reports current alcohol use of about 6.0 standard drinks of alcohol per week. He reports that he does not use drugs. Allergies  Allergies  Allergen Reactions  . Keflex [Cephalexin] Nausea And Vomiting  . Pletal [Cilostazol] Palpitations    "Can hear heart beating loudly".  . Sulfa Antibiotics Rash   Home medications Prior to Admission medications   Medication Sig Start Date End Date Taking? Authorizing Provider  apixaban (ELIQUIS) 5 MG TABS tablet Take 1 tablet (5 mg total) by mouth 2 (two) times daily. DO NOT TAKE THIS MEDICATION UNTIL WE RESTART IT AFTER YOUR SURGERY. 02/16/20  Yes Rhyne, Hulen Shouts, PA-C  aspirin 81 MG EC tablet Take 81 mg by mouth daily.    Yes [provider]  atorvastatin (LIPITOR) 20 MG tablet Take 1 tablet by mouth once daily Patient taking differently: Take 20 mg by mouth daily.  12/24/19  Yes Miquel Dunn, NP  furosemide (LASIX) 40 MG tablet Take 40 mg by mouth 2 (two) times daily with a meal.    Yes [provider]  hydrALAZINE (APRESOLINE) 100 MG tablet TAKE 1 TABLET BY MOUTH THREE TIMES DAILY Patient taking differently: Take 100 mg by mouth 2 (two) times daily.  02/24/20  Yes Adrian Prows, MD  insulin NPH-regular Human (70-30) 100 UNIT/ML injection Inject 46-56 Units into  the skin daily with breakfast.   Yes [provider]  lisinopril (ZESTRIL) 10 MG tablet Take 1 tablet (10 mg total) by mouth daily. 02/16/20  Yes Rhyne, Samantha J, PA-C  metoprolol tartrate (LOPRESSOR) 100 MG tablet Take 1 tablet by mouth twice daily 05/13/20  Yes Tolia, Sunit, DO  prazosin (MINIPRESS) 2 MG capsule TAKE 1 CAPSULE BY MOUTH IN THE EVENING AFTER SUPPER Patient taking differently: Take 2 mg by mouth daily after supper.  02/24/20  Yes Adrian Prows, MD  ACCU-CHEK FASTCLIX LANCETS MISC Check blood sugar TID & QHS 10/01/14   Advani, Vernon Prey,  MD  blood glucose meter kit and supplies KIT Check blood sugar TID & QHS 02/10/15   Advani, Vernon Prey, MD  Blood Glucose Monitoring Suppl (ACCU-CHEK ADVANTAGE DIABETES) kit Use as instructed 10/01/14   Lorayne Marek, MD  glucose blood (ACCU-CHEK AVIVA PLUS) test strip Use as instructed 10/01/14   Advani, Vernon Prey, MD  glucose blood (CHOICE DM FORA G20 TEST STRIPS) test strip Check blood sugar TID & QHS 02/10/15   Advani, Vernon Prey, MD  glucose blood test strip Use as instructed 11/20/13   Reyne Dumas, MD    Current Medications Scheduled Meds: . atorvastatin  20 mg Oral Daily  . hydrALAZINE  100 mg Oral TID  . insulin aspart  0-15 Units Subcutaneous Q4H  . insulin glargine  5 Units Subcutaneous BID  . metoprolol tartrate  100 mg Oral BID  . pantoprazole (PROTONIX) IV  40 mg Intravenous Q12H  . prazosin  2 mg Oral QPC supper   Continuous Infusions: . sodium chloride     PRN Meds:.acetaminophen **OR** acetaminophen, albuterol, labetalol, ondansetron **OR** ondansetron (ZOFRAN) IV  CBC Recent Labs  Lab 05/23/20 1319 05/23/20 1955  WBC 5.7 6.0  HGB 11.5* 11.3*  HCT 35.9* 34.6*  MCV 89.1 89.9  PLT 143* 440*   Basic Metabolic Panel Recent Labs  Lab 05/23/20 1319  NA 135  K 5.8*  CL 105  CO2 16*  GLUCOSE 142*  BUN 97*  CREATININE 6.78*  CALCIUM 8.4*    Physical Exam  Blood pressure (!) 209/82, pulse 60, temperature 97.7 F (36.5  C), temperature source Oral, resp. rate 13, height 6' (1.829 m), weight 122.5 kg, SpO2 99 %. GEN: NAD ENT: NCAT EYES: EOMI CV: RRR nl s1s2 PULM: CTAB, nl wob ABD: s/nt/nd SKIN: No rashes/lesions, R BKA with sock EXT:No LEE in LLE  Assessment 82F AoCKD4, Mild hyperkalemia, and melanotic stools.    1. AoCKD4, no obvious new nephrotoxins, doubt obstruction (but will get [place]).  Maybe combo of #2 and ACEi/Diuretic?  This isn't progression of CKD, more acute.  No uremic S/Sx, no indication for HD. 2. Melena / GIB; Hb stable, Gi following, on DOAC and ASA, held. For EGD 3. Hyperkalemia, mild.   4. HTN on ACEi, lasix; acutely elevated now; hydralazine+MTP resumed 5. CAD 6. PVD s/p BKA 02/2020 7. S/p PPM, AFib on apixaban, held 8. DM2  Plan 1. Check [place] 2. UA, U lytes 3. Agree with gentle IVFs 4. Trend SCr 5. Daily weights, Daily Renal Panel, Strict I/Os, Avoid nephrotoxins (NSAIDs, judicious IV Contrast)    Alexander [name]  05/23/2020, 8:41 PM

## 2020-05-23 NOTE — H&P (Signed)
History and Physical  Alexander [name] ZOX:[id number] DOB: [birth year] DOA: 05/23/2020  PCP: [name], MD   Patient coming from:Home.  Chief Complaint:Dark Bowel Movement.  HPI: Alexander [name] is a 59 y.o. male with medical history significant for A. fib on Eliquis,recent right TKA 4/30,CAD s/ PTCA 2015,PVD,s/p PM,HLD,CKD stage IV baseline creatinine 2.5-3 followed by Dr. Justin Mend on ASA/Lipitor/Lasix 40 mg bid/hydralazine,lisinopril,prazosin, diabetes mellitus on long-term insulin with NPH 70/30, 56  Ac BF and 46 U before supper to the ED for evaluation of black tarry stool for 2 days.Patient has had 3 bowel movements in the past 2 days,has not had any iron or Pepto-Bismol. He has had no previous history of GI bleed.  Patient is any chest pain, nausea, vomiting, abdominal pain fever or chills.  Patient reports she has had EGD and colonoscopy in 2015 and was unremarkable.   ED Course:Initial blood pressure in 200 and subsequently in 180s,room air, blood work showed hyperkalemia 5.8, metabolic acidosis bicarb 16, BUN 97 creatinine 6.7,hemoglobin 11.5.ED had dark brown stool heme positive. In ED he had 2 large-bore IV,Lokelma 10 g x 1,kept n.p.o.GI was consulted and admission was requested under hospitalist.  Review of Systems:All systems were reviewed and were negative except as mentioned in HPI above. Negative for fever. Negative for chest pain. Negative for shortness of breath.  Past Medical History:  Diagnosis Date  . Alcohol abuse   . Anemia   . Arthritis    "patient does not think so."  . Atrial fibrillation ([place])   . Cardiac syncope 10/07/14   rapid A fib with 8 sec pauses on converison with syncope- temp pacing wire placed then PPM  . Cataract    BILATERAL  . Chronic kidney disease   . Coronary artery disease   . Diabetes mellitus    dx---been  awhile  . Encounter for care of pacemaker 10/08/2014  . History of blood transfusion   . Hyperlipemia   . Hypertension   .  Osteomyelitis ([place])    right foot  . Peripheral vascular disease ([place])   . Presence of permanent cardiac pacemaker 10/08/2014  . Sinus node dysfunction ([place]) 10/08/2014    Past Surgical History:  Procedure Laterality Date  . ABDOMINAL AORTOGRAM W/LOWER EXTREMITY N/A 09/09/2017   Procedure: ABDOMINAL AORTOGRAM W/LOWER EXTREMITY;  Surgeon: Angelia Mould, MD;  Location: [facility];  Service: Cardiovascular;  Laterality: N/A;  . ABDOMINAL AORTOGRAM W/LOWER EXTREMITY Right 11/11/2019   Procedure: ABDOMINAL AORTOGRAM W/LOWER EXTREMITY;  Surgeon: Marty Heck, MD;  Location: [facility];  Service: Cardiovascular;  Laterality: Right;  . AMPUTATION Left 08/19/2013   Procedure: AMPUTATION RAY;  Surgeon: Alta Corning, MD;  Location: [facility];  Service: Orthopedics;  Laterality: Left;  ray amputation left 5th  . AMPUTATION Left 10/27/2013   Procedure: AMPUTATION DIGIT-LEFT 4TH TOE, 4th and 5th metatarsal.;  Surgeon: Angelia Mould, MD;  Location: [facility];  Service: Vascular;  Laterality: Left;  . AMPUTATION Left 11/04/2013   Procedure: LEFT FOOT TRANS-METATARSAL AMPUTATION WITH WOUND CLOSURE ;  Surgeon: Alta Corning, MD;  Location: [facility];  Service: Orthopedics;  Laterality: Left;  . AMPUTATION Right 09/10/2017   Procedure: RIGHT FOURTH AND FIFTH TOE AMPUTATION;  Surgeon: Angelia Mould, MD;  Location: [facility];  Service: Vascular;  Laterality: Right;  . AMPUTATION Right 02/19/2020   Procedure: AMPUTATION RIGHT 3RD TOE;  Surgeon: Angelia Mould, MD;  Location: [facility];  Service: Vascular;  Laterality: Right;  .  AMPUTATION Right 03/11/2020   Procedure: RIGHT BELOW KNEE AMPUTATION;  Surgeon: Newt Minion, MD;  Location: [facility];  Service: Orthopedics;  Laterality: Right;  . APPLICATION OF WOUND VAC Right 02/19/2020   Procedure: Application Of Wound Vac Right foot;  Surgeon: Angelia Mould, MD;  Location: [facility];  Service: Vascular;  Laterality: Right;  .  CARDIAC CATHETERIZATION  10/07/2014   Procedure: LEFT HEART CATH AND CORONARY ANGIOGRAPHY;  Surgeon: Laverda Page, MD;  Location: [facility];  Service: Cardiovascular;;  . COLONOSCOPY W/ POLYPECTOMY    . EMBOLECTOMY Right 09/12/2017   Procedure: Thrombectomy  & Redo Right Below Knee Popliteal Artery Bypass Graft  ;  Surgeon: Waynetta Sandy, MD;  Location: [facility];  Service: Vascular;  Laterality: Right;  . EYE SURGERY Bilateral    cataract  . FEMORAL-TIBIAL BYPASS GRAFT Left 10/27/2013   Procedure: BYPASS GRAFT LEFT FEMORAL- POSTERIOR TIBIAL ARTERY;  Surgeon: Angelia Mould, MD;  Location: [facility];  Service: Vascular;  Laterality: Left;  . FEMORAL-TIBIAL BYPASS GRAFT Right 09/10/2017   Procedure: RIGHT SUPERFICIAL  FEMORAL ARTERY-BELOW KNEE POPLITEAL ARTERY BYPASS GRAFT WITH VEIN;  Surgeon: Angelia Mould, MD;  Location: [facility];  Service: Vascular;  Laterality: Right;  . I & D EXTREMITY Left 10/27/2013   Procedure: IRRIGATION AND DEBRIDEMENT EXTREMITY- LEFT FOOT;  Surgeon: Angelia Mould, MD;  Location: [facility];  Service: Vascular;  Laterality: Left;  . I & D EXTREMITY Right 01/26/2020   Procedure: IRRIGATION AND DEBRIDEMENT EXTREMITY RIGHT FOOT WOUND;  Surgeon: Angelia Mould, MD;  Location: [facility];  Service: Vascular;  Laterality: Right;  . INTRAOPERATIVE ARTERIOGRAM Right 09/10/2017   Procedure: INTRA OPERATIVE ARTERIOGRAM;  Surgeon: Angelia Mould, MD;  Location: [facility];  Service: Vascular;  Laterality: Right;  . IR ANGIOGRAM FOLLOW UP STUDY  09/09/2017  . LEFT HEART CATHETERIZATION WITH CORONARY ANGIOGRAM N/A 11/09/2014   Procedure: LEFT HEART CATHETERIZATION WITH CORONARY ANGIOGRAM;  Surgeon: Laverda Page, MD;  Location: [facility] CATH LAB;  Service: Cardiovascular;  Laterality: N/A;  . LOWER EXTREMITY ANGIOGRAM Right 11/08/2015   Procedure: Lower Extremity Angiogram;  Surgeon: Serafina Mitchell, MD;  Location: [facility];  Service:  Cardiovascular;  Laterality: Right;  . LUMBAR LAMINECTOMY  06/13/2012   Procedure: MICRODISCECTOMY LUMBAR LAMINECTOMY;  Surgeon: Jessy Oto, MD;  Location: [facility];  Service: Orthopedics;  Laterality: N/A;  Central laminectomy L2-3, L3-4, L4-5  . PACEMAKER INSERTION  10/08/14   MDT Advisa MRI compatible dual chamber pacemaker implanted by Dr Caryl Comes for syncope with post-termination pauses  . PERCUTANEOUS CORONARY STENT INTERVENTION (PCI-S)  11/09/2014   des to lad & distal circumflex         Dr  [name]  . PERIPHERAL VASCULAR BALLOON ANGIOPLASTY Right 11/11/2019   Procedure: PERIPHERAL VASCULAR BALLOON ANGIOPLASTY;  Surgeon: Marty Heck, MD;  Location: [facility];  Service: Cardiovascular;  Laterality: Right;  below knee popliteal, tibioperoneal trunk, posterior tibial arteries  . PERIPHERAL VASCULAR CATHETERIZATION N/A 11/08/2015   Procedure: Abdominal Aortogram;  Surgeon: Serafina Mitchell, MD;  Location: [facility];  Service: Cardiovascular;  Laterality: N/A;  . PERIPHERAL VASCULAR CATHETERIZATION Right 11/08/2015   Procedure: Peripheral Vascular Atherectomy;  Surgeon: Serafina Mitchell, MD;  Location: [facility];  Service: Cardiovascular;  Laterality: Right;  . PERIPHERAL VASCULAR CATHETERIZATION N/A 10/10/2016   Procedure: Lower Extremity Angiography;  Surgeon: Waynetta Sandy, MD;  Location: [place];  Service: Cardiovascular;  Laterality: N/A;  . PERIPHERAL VASCULAR CATHETERIZATION Right 10/10/2016   Procedure: Peripheral Vascular Atherectomy;  Surgeon: Waynetta Sandy, MD;  Location: [facility];  Service: Cardiovascular;  Laterality: Right;  Popliteal  . PERMANENT PACEMAKER INSERTION N/A 10/08/2014   Procedure: PERMANENT PACEMAKER INSERTION;  Surgeon: Deboraha Sprang, MD;  Location: [facility] C[facility];  Service: Cardiovascular;  Laterality: N/A;  . TEMPORARY PACEMAKER INSERTION Bilateral 10/07/2014   Procedure: TEMPORARY PACEMAKER  INSERTION;  Surgeon: Laverda Page, MD;  Location: [facility];  Service: Cardiovascular;  Laterality: Bilateral;  . WOUND DEBRIDEMENT Right 02/19/2020   Procedure: DEBRIDEMENT WOUND RIGHT FOOT;  Surgeon: Angelia Mould, MD;  Location: [place];  Service: Vascular;  Laterality: Right;     reports that he quit smoking about 5 years ago. His smoking use included cigars. He has a 45.00 pack-year smoking history. He has never used smokeless tobacco. He reports current alcohol use of about 6.0 standard drinks of alcohol per week. He reports that he does not use drugs.  Allergies  Allergen Reactions  . Keflex [Cephalexin] Nausea And Vomiting  . Pletal [Cilostazol] Palpitations    "Can hear heart beating loudly".  . Sulfa Antibiotics Rash    Family History  Problem Relation Age of Onset  . Diabetes type II Mother   . Hypertension Mother   . Diabetes Mother   . Liver cancer Father   . Diabetes type II Sister   . Breast cancer Sister   . Diabetes Sister   . Hypertension Sister   . Diabetes type II Brother   . Kidney failure Brother   . Diabetes Brother   . Hypertension Brother   . Diabetes type II Sister      Prior to Admission medications   Medication Sig Start Date End Date Taking? Authorizing Provider  apixaban (ELIQUIS) 5 MG TABS tablet Take 1 tablet (5 mg total) by mouth 2 (two) times daily. DO NOT TAKE THIS MEDICATION UNTIL WE RESTART IT AFTER YOUR SURGERY. 02/16/20  Yes Rhyne, Hulen Shouts, PA-C  aspirin 81 MG EC tablet Take 81 mg by mouth daily.    Yes [provider]  atorvastatin (LIPITOR) 20 MG tablet Take 1 tablet by mouth once daily Patient taking differently: Take 20 mg by mouth daily.  12/24/19  Yes Miquel Dunn, NP  furosemide (LASIX) 40 MG tablet Take 40 mg by mouth 2 (two) times daily with a meal.    Yes [provider]  hydrALAZINE (APRESOLINE) 100 MG tablet TAKE 1 TABLET BY MOUTH THREE TIMES DAILY Patient taking differently: Take 100 mg  by mouth 2 (two) times daily.  02/24/20  Yes Adrian Prows, MD  insulin NPH-regular Human (70-30) 100 UNIT/ML injection Inject 46-56 Units into the skin daily with breakfast.   Yes [provider]  lisinopril (ZESTRIL) 10 MG tablet Take 1 tablet (10 mg total) by mouth daily. 02/16/20  Yes Rhyne, Samantha J, PA-C  metoprolol tartrate (LOPRESSOR) 100 MG tablet Take 1 tablet by mouth twice daily 05/13/20  Yes Tolia, Sunit, DO  prazosin (MINIPRESS) 2 MG capsule TAKE 1 CAPSULE BY MOUTH IN THE EVENING AFTER SUPPER Patient taking differently: Take 2 mg by mouth daily after supper.  02/24/20  Yes Adrian Prows, MD  ACCU-CHEK FASTCLIX LANCETS MISC Check blood sugar TID & QHS 10/01/14   Lorayne Marek, MD  blood glucose meter kit and supplies KIT Check blood sugar TID & QHS 02/10/15   Lorayne Marek, MD  Blood Glucose Monitoring Suppl (ACCU-CHEK ADVANTAGE DIABETES) kit Use as instructed 10/01/14   Lorayne Marek, MD  glucose blood (ACCU-CHEK AVIVA PLUS) test strip Use as instructed 10/01/14   Advani, Vernon Prey, MD  glucose blood (CHOICE DM FORA G20 TEST STRIPS) test strip Check blood sugar TID & QHS 02/10/15   Advani, Vernon Prey, MD  glucose blood test strip Use as instructed 11/20/13   Reyne Dumas, MD  Multiple Vitamin (MULTIVITAMIN WITH MINERALS) TABS tablet Take 1 tablet by mouth daily. Patient not taking: Reported on 05/23/2020    [provider]  oxyCODONE (OXY IR/ROXICODONE) 5 MG immediate release tablet Take 1-2 tablets (5-10 mg total) by mouth every 4 (four) hours as needed for moderate pain (pain score 4-6). Patient not taking: Reported on 05/23/2020 03/16/20   Persons, Bevely Palmer, [place]    Physical Exam: Vitals:   05/23/20 1545 05/23/20 1600 05/23/20 1630 05/23/20 1700  BP: (!) 181/70 (!) 186/76 (!) 159/61 (!) 175/72  Pulse: 60 61 (!) 59 60  Resp: _0 Temp: 97.7 F (36.5 C)     TempSrc: Oral     SpO2: 98% 97% 98% 99%  Weight:      Height:        General exam: AAOx3, not in acute  distress,weak appearing. HEENT:Oral mucosa moist, Ear/Nose WNL grossly, dentition normal. Respiratory system: bilaterally clear,no wheezing or crackles,no use of accessory muscle. Cardiovascular system: S1 & S2 +, No JVD. Gastrointestinal system: Abdomen soft, NT,ND, BS+. Nervous System:Alert, awake, moving extremities and grossly non-focal.Right BKA left midfoot amputation. Extremities:Mild pitting edema on the left lower extremity,distal peripheral pulses palpable.  Skin:No rashes,no icterus. TMY:TRZNBV muscle bulk,tone, power.  Labs on Admission: I have personally reviewed following labs and imaging studies. CBC: Recent Labs  Lab 05/23/20 1319  WBC 5.7  HGB 11.5*  HCT 35.9*  MCV 89.1  PLT 670*   Basic Metabolic Panel: Recent Labs  Lab 05/23/20 1319  NA 135  K 5.8*  CL 105  CO2 16*  GLUCOSE 142*  BUN 97*  CREATININE 6.78*  CALCIUM 8.4*   GFR: Estimated Creatinine Clearance: 15.5 mL/min (A) (by C-G formula based on SCr of 6.78 mg/dL (H)). Liver Function Tests: Recent Labs  Lab 05/23/20 1319  AST 14*  ALT 20  ALKPHOS 105  BILITOT 0.3  PROT 6.9  ALBUMIN 3.5   No results for input(s): LIPASE, AMYLASE in the last 168 hours. No results for input(s): AMMONIA in the last 168 hours. Coagulation Profile: No results for input(s): INR, PROTIME in the last 168 hours. Cardiac Enzymes: No results for input(s): CKTOTAL, CKMB, CKMBINDEX, TROPONINI in the last 168 hours. BNP (last 3 results) No results for input(s): PROBNP in the last 8760 hours. HbA1C: No results for input(s): HGBA1C in the last 72 hours. CBG: Recent Labs  Lab 05/23/20 1259  GLUCAP 172*   Lipid Profile: No results for input(s): CHOL, HDL, LDLCALC, TRIG, CHOLHDL, LDLDIRECT in the last 72 hours. Thyroid Function Tests: No results for input(s): TSH, T4TOTAL, FREET4, T3FREE, THYROIDAB in the last 72 hours. Anemia Panel: No results for input(s): VITAMINB12, FOLATE, FERRITIN, TIBC, IRON, RETICCTPCT in  the last 72 hours. Urine analysis:    Component Value Date/Time   COLORURINE YELLOW 09/09/2017 1742   APPEARANCEUR CLEAR 09/09/2017 1742   LABSPEC 1.018 09/09/2017 1742   PHURINE 5.0 09/09/2017 1742   GLUCOSEU 50 (A) 09/09/2017 1742   HGBUR NEGATIVE 09/09/2017 1742   BILIRUBINUR NEGATIVE 09/09/2017 1742   BILIRUBINUR neg 10/01/2014 1445  KETONESUR NEGATIVE 09/09/2017 1742   PROTEINUR NEGATIVE 09/09/2017 1742   UROBILINOGEN 0.2 10/01/2014 1445   UROBILINOGEN 0.2 09/30/2014 2051   NITRITE NEGATIVE 09/09/2017 1742   LEUKOCYTESUR NEGATIVE 09/09/2017 1742    Radiological Exams on Admission: No results found.   Assessment/Plan  GI bleeding with elevated BUN/ heme positive stool with black tarry stool for 2 days In the setting of Eliquis and also with AKI on CKD: Admit to SDU,Keep the patient n.p.o. add gentle IV fluid, GI has been consulted- Ordered Protonix, serial H&H, clear liquid diet as per GI.  Holding anticoagulation/aspirin for now.  AKI on CKD stage IV baseline creatinine 2.5-3: Suspect due to # 1, BUN/creatinine significantly elevated, nephrology will be consulted hold lisinopril and lasix. add gentle IV fluid hydration.  Monitor intake output.  He is followed by Dr. Justin Mend consulted Dr. Carmina Miller and discussed the case with him.  Hyperkalemia: K at 5.8 likely from AKI, lisinopril and also from GI bleed.  Keep on IV fluid hydration monitor potassium level closely, nephro consulted.  Patient received Lokelma in the ED.   Metabolic acidosis with bicarb at 16.  Likely from AKI on CKD keep on IV fluids repeat labs.  CAD s/ PTCA 2015, PVD, HLDs/p PM: Denies chest pain.Hold Aspirin/anticoagulation, resume antihypertensive,statins.  Uncontrolled hypertension/hypertension urgency blood pressure 200 initially: BP now down in 170s. Resume p.o. home meds-hydralazine,prazosin,hold lisinopril,and Lasix due to AKI, and add as needed labetalol.  A. fib on Eliquis: We will hold  anticoagulation.  Metoprolol.  Recent right TKA 4/30.  Diabetes mellitus on long-term insulin with NPH 70/30, 56  Ac BF and 46 U before supper: Keep the patient clear liquid diet, add sliding scale insulin, levemir 5 u nits BID, monitor accuchecks.Last A1c 8.6 in March of this year  Morbid obesity with BMI Body mass index is 36.62 kg/m.   Severity of Illness: * I certify that at the point of admission it is my clinical judgment that the patient will require inpatient hospital care spanning beyond 2 midnights from the point of admission due to high intensity of service, high risk for further deterioration and high frequency of surveillance required for acute GI bleed hyperkalemia and severe AKI on CKD*   DVT prophylaxis:  SCD Code Status: Full code Family Communication: Admission, patients condition and plan of care including tests being ordered have been discussed with the patient  who indicate understanding and agree with the plan and Code Status.  Consults called:  Gastroenterology Nephrology  Antonieta Pert MD [facility]  If 7PM-7AM, please contact night-coverage [email or web address]  05/23/2020, 5:46 PM

## 2020-05-23 NOTE — ED Triage Notes (Signed)
Per EMS- patient c/o black tarry stools x 2 days.

## 2020-05-23 NOTE — Consult Note (Addendum)
Referring Provider:  [facility]         Primary Care Physician:  [name], MD Primary Gastroenterologist: Zenovia Jarred, MD             We were asked to see this patient for:  Dark , heme + stool                ASSESSMENT /  PLAN    Alexander [name] is a 59 y.o. male PMH significant for, but not necessarily limited to, DM, Afib , HTN, PVD, CKD, adenomatous colon polyps, osteomyelitis s/p right BKA May 2021                                                                                                                         # Dark, heme positive stools on Eliquis / ASA.  --Rule out PUD, AVMs --Hgb 11.5 at baseline --Clears okay today. NPO after MN for possible EGD if Endo schedule allows. The risks and benefits of EGD were discussed and the patient agrees to proceed.  --BID IV PPI --Monitor CBC --I called and updated wife per patient's request  # CKD on AKI.  --Cr 2.5 >> 6.7 --Followed outpatient by Dr. Justin Mend  # Afib on Eliquis  # Hx of adenomatous colon polyps in 2015.  --Overdue for recommended 3 year recall colonoscopy in 2018. This can be done as outpatient unless this current clinical course dictates otherwise.  Velora Heckler GI Attending   I have taken an interval history, reviewed the chart and examined the patient. I agree with the Advanced Practitioner's note, impression and recommendations.    Gatha Mayer, MD, [facility] 05/23/2020 7:39 PM   HPI:    Chief Complaint: dark stools  Alexander [name] is a 59 y.o. male on Eliquis with PMH as above. Patient passed dark stool this am, advised by PCP to come to ED. In ED his hgb is 11.5, about at baseline. He has CKD. Cr 2.5 end of April, now at 6.7. His K+ is 5.8. In mid June his ferritin is 201, TIBC 202 with 20 % sat. Stools are heme +.   Patient began having dark, loose stools yesterday and has had a few more today. No Fe+ or bismuth use. No associated abdominal pain. No nausea. He  takes a daily baby asa, no other NSAIDS. On Eliquis, last dose this am. Patient feels fine.    PREVIOUS ENDOSCOPIC EVALUATIONS / GI STUDIES :  Nov 2015 Screening colonoscopy ENDOSCOPIC IMPRESSION: 1. Four sessile polyps ranging from 4 to 77m in size were found in the ascending colon, transverse colon, and sigmoid colon; polypectomies were performed with a cold snare 2. The examination was otherwise normal  Biopsies - tubular adenomas  Past Medical History:  Diagnosis Date  . Alcohol abuse   . Anemia   . Arthritis    "patient does not think so."  . [place]   .  Cardiac syncope 10/07/14   rapid A fib with 8 sec pauses on converison with syncope- temp pacing wire placed then PPM  . Cataract    BILATERAL  . Chronic kidney disease   . Coronary artery disease   . Diabetes mellitus    dx---been  awhile  . Encounter for care of pacemaker 10/08/2014  . History of blood transfusion   . Hyperlipemia   . Hypertension   . Osteomyelitis ([place])    right foot  . Peripheral vascular disease ([place])   . Presence of permanent cardiac pacemaker 10/08/2014  . Sinus node dysfunction ([place]) 10/08/2014    Past Surgical History:  Procedure Laterality Date  . ABDOMINAL AORTOGRAM W/LOWER EXTREMITY N/A 09/09/2017   Procedure: ABDOMINAL AORTOGRAM W/LOWER EXTREMITY;  Surgeon: Angelia Mould, MD;  Location: [place] [facility];  Service: Cardiovascular;  Laterality: N/A;  . ABDOMINAL AORTOGRAM W/LOWER EXTREMITY Right 11/11/2019   Procedure: ABDOMINAL AORTOGRAM W/LOWER EXTREMITY;  Surgeon: Marty Heck, MD;  Location: [place] [facility];  Service: Cardiovascular;  Laterality: Right;  . AMPUTATION Left 08/19/2013   Procedure: AMPUTATION RAY;  Surgeon: Alta Corning, MD;  Location: [place];  Service: Orthopedics;  Laterality: Left;  ray amputation left 5th  . AMPUTATION Left 10/27/2013   Procedure: AMPUTATION DIGIT-LEFT 4TH TOE, 4th and 5th metatarsal.;  Surgeon: Angelia Mould, MD;  Location: [place];  Service: Vascular;  Laterality: Left;  . AMPUTATION Left 11/04/2013   Procedure: LEFT FOOT TRANS-METATARSAL AMPUTATION WITH WOUND CLOSURE ;  Surgeon: Alta Corning, MD;  Location: [place];  Service: Orthopedics;  Laterality: Left;  . AMPUTATION Right 09/10/2017   Procedure: RIGHT FOURTH AND FIFTH TOE AMPUTATION;  Surgeon: Angelia Mould, MD;  Location: [place];  Service: Vascular;  Laterality: Right;  . AMPUTATION Right 02/19/2020   Procedure: AMPUTATION RIGHT 3RD TOE;  Surgeon: Angelia Mould, MD;  Location: [place];  Service: Vascular;  Laterality: Right;  . AMPUTATION Right 03/11/2020   Procedure: RIGHT BELOW KNEE AMPUTATION;  Surgeon: Newt Minion, MD;  Location: [place];  Service: Orthopedics;  Laterality: Right;  . APPLICATION OF WOUND VAC Right 02/19/2020   Procedure: Application Of Wound Vac Right foot;  Surgeon: Angelia Mould, MD;  Location: [place];  Service: Vascular;  Laterality: Right;  . CARDIAC CATHETERIZATION  10/07/2014   Procedure: LEFT HEART CATH AND CORONARY ANGIOGRAPHY;  Surgeon: Laverda Page, MD;  Location: [facility] C[facility] LAB;  Service: Cardiovascular;;  . COLONOSCOPY W/ POLYPECTOMY    . EMBOLECTOMY Right 09/12/2017   Procedure: Thrombectomy  & Redo Right Below Knee Popliteal Artery Bypass Graft  ;  Surgeon: Waynetta Sandy, MD;  Location: [place];  Service: Vascular;  Laterality: Right;  . EYE SURGERY Bilateral    cataract  . FEMORAL-TIBIAL BYPASS GRAFT Left 10/27/2013   Procedure: BYPASS GRAFT LEFT FEMORAL- POSTERIOR TIBIAL ARTERY;  Surgeon: Angelia Mould, MD;  Location: [place];  Service: Vascular;  Laterality: Left;  . FEMORAL-TIBIAL BYPASS GRAFT Right 09/10/2017   Procedure: RIGHT SUPERFICIAL  FEMORAL ARTERY-BELOW KNEE POPLITEAL ARTERY BYPASS GRAFT WITH VEIN;  Surgeon: Angelia Mould, MD;  Location: [place];  Service: Vascular;  Laterality: Right;  . I & D EXTREMITY Left 10/27/2013   Procedure: IRRIGATION  AND DEBRIDEMENT EXTREMITY- LEFT FOOT;  Surgeon: Angelia Mould, MD;  Location: [place];  Service: Vascular;  Laterality: Left;  . I & D EXTREMITY Right 01/26/2020   Procedure: IRRIGATION AND DEBRIDEMENT EXTREMITY RIGHT FOOT  WOUND;  Surgeon: Angelia Mould, MD;  Location: [place];  Service: Vascular;  Laterality: Right;  . INTRAOPERATIVE ARTERIOGRAM Right 09/10/2017   Procedure: INTRA OPERATIVE ARTERIOGRAM;  Surgeon: Angelia Mould, MD;  Location: [place];  Service: Vascular;  Laterality: Right;  . IR ANGIOGRAM FOLLOW UP STUDY  09/09/2017  . LEFT HEART CATHETERIZATION WITH CORONARY ANGIOGRAM N/A 11/09/2014   Procedure: LEFT HEART CATHETERIZATION WITH CORONARY ANGIOGRAM;  Surgeon: Laverda Page, MD;  Location: [facility] CATH LAB;  Service: Cardiovascular;  Laterality: N/A;  . LOWER EXTREMITY ANGIOGRAM Right 11/08/2015   Procedure: Lower Extremity Angiogram;  Surgeon: Serafina Mitchell, MD;  Location: [place] [facility];  Service: Cardiovascular;  Laterality: Right;  . LUMBAR LAMINECTOMY  06/13/2012   Procedure: MICRODISCECTOMY LUMBAR LAMINECTOMY;  Surgeon: Jessy Oto, MD;  Location: [place];  Service: Orthopedics;  Laterality: N/A;  Central laminectomy L2-3, L3-4, L4-5  . PACEMAKER INSERTION  10/08/14   MDT Advisa MRI compatible dual chamber pacemaker implanted by Dr Caryl Comes for syncope with post-termination pauses  . PERCUTANEOUS CORONARY STENT INTERVENTION (PCI-S)  11/09/2014   des to lad & distal circumflex         Dr  [name]  . PERIPHERAL VASCULAR BALLOON ANGIOPLASTY Right 11/11/2019   Procedure: PERIPHERAL VASCULAR BALLOON ANGIOPLASTY;  Surgeon: Marty Heck, MD;  Location: [place] [facility];  Service: Cardiovascular;  Laterality: Right;  below knee popliteal, tibioperoneal trunk, posterior tibial arteries  . PERIPHERAL VASCULAR CATHETERIZATION N/A 11/08/2015   Procedure: Abdominal Aortogram;  Surgeon: Serafina Mitchell, MD;  Location: [place] [facility];  Service: Cardiovascular;   Laterality: N/A;  . PERIPHERAL VASCULAR CATHETERIZATION Right 11/08/2015   Procedure: Peripheral Vascular Atherectomy;  Surgeon: Serafina Mitchell, MD;  Location: [place] [facility];  Service: Cardiovascular;  Laterality: Right;  . PERIPHERAL VASCULAR CATHETERIZATION N/A 10/10/2016   Procedure: Lower Extremity Angiography;  Surgeon: Waynetta Sandy, MD;  Location: [place] [facility];  Service: Cardiovascular;  Laterality: N/A;  . PERIPHERAL VASCULAR CATHETERIZATION Right 10/10/2016   Procedure: Peripheral Vascular Atherectomy;  Surgeon: Waynetta Sandy, MD;  Location: [place] [facility];  Service: Cardiovascular;  Laterality: Right;  Popliteal  . PERMANENT PACEMAKER INSERTION N/A 10/08/2014   Procedure: PERMANENT PACEMAKER INSERTION;  Surgeon: Deboraha Sprang, MD;  Location: [facility] CATH LAB;  Service: Cardiovascular;  Laterality: N/A;  . TEMPORARY PACEMAKER INSERTION Bilateral 10/07/2014   Procedure: TEMPORARY PACEMAKER INSERTION;  Surgeon: Laverda Page, MD;  Location: [facility] CATH LAB;  Service: Cardiovascular;  Laterality: Bilateral;  . WOUND DEBRIDEMENT Right 02/19/2020   Procedure: DEBRIDEMENT WOUND RIGHT FOOT;  Surgeon: Angelia Mould, MD;  Location: [facility];  Service: Vascular;  Laterality: Right;    Prior to Admission medications   Medication Sig Start Date End Date Taking? Authorizing Provider  apixaban (ELIQUIS) 5 MG TABS tablet Take 1 tablet (5 mg total) by mouth 2 (two) times daily. DO NOT TAKE THIS MEDICATION UNTIL WE RESTART IT AFTER YOUR SURGERY. 02/16/20  Yes Rhyne, Hulen Shouts, PA-C  aspirin 81 MG EC tablet Take 81 mg by mouth daily.    Yes [provider]  atorvastatin (LIPITOR) 20 MG tablet Take 1 tablet by mouth once daily Patient taking differently: Take 20 mg by mouth daily.  12/24/19  Yes Miquel Dunn, NP  furosemide (LASIX) 40 MG tablet Take 40 mg by mouth 2 (two) times daily with a meal.    Yes [provider]  hydrALAZINE  (APRESOLINE) 100 MG  tablet TAKE 1 TABLET BY MOUTH THREE TIMES DAILY Patient taking differently: Take 100 mg by mouth 2 (two) times daily.  02/24/20  Yes Adrian Prows, MD  insulin NPH-regular Human (70-30) 100 UNIT/ML injection Inject 46-56 Units into the skin daily with breakfast.   Yes [provider]  lisinopril (ZESTRIL) 10 MG tablet Take 1 tablet (10 mg total) by mouth daily. 02/16/20  Yes Rhyne, Samantha J, PA-C  metoprolol tartrate (LOPRESSOR) 100 MG tablet Take 1 tablet by mouth twice daily 05/13/20  Yes Tolia, Sunit, DO  prazosin (MINIPRESS) 2 MG capsule TAKE 1 CAPSULE BY MOUTH IN THE EVENING AFTER SUPPER Patient taking differently: Take 2 mg by mouth daily after supper.  02/24/20  Yes Adrian Prows, MD  ACCU-CHEK FASTCLIX LANCETS MISC Check blood sugar TID & QHS 10/01/14   Lorayne Marek, MD  blood glucose meter kit and supplies KIT Check blood sugar TID & QHS 02/10/15   Advani, Vernon Prey, MD  Blood Glucose Monitoring Suppl (ACCU-CHEK ADVANTAGE DIABETES) kit Use as instructed 10/01/14   Lorayne Marek, MD  glucose blood (ACCU-CHEK AVIVA PLUS) test strip Use as instructed 10/01/14   Advani, Vernon Prey, MD  glucose blood (CHOICE DM FORA G20 TEST STRIPS) test strip Check blood sugar TID & QHS 02/10/15   Advani, Vernon Prey, MD  glucose blood test strip Use as instructed 11/20/13   Reyne Dumas, MD  Multiple Vitamin (MULTIVITAMIN WITH MINERALS) TABS tablet Take 1 tablet by mouth daily. Patient not taking: Reported on 05/23/2020    [provider]  oxyCODONE (OXY IR/ROXICODONE) 5 MG immediate release tablet Take 1-2 tablets (5-10 mg total) by mouth every 4 (four) hours as needed for moderate pain (pain score 4-6). Patient not taking: Reported on 05/23/2020 03/16/20   Persons, Bevely Palmer, [place]    No current facility-administered medications for this encounter.   Current Outpatient Medications  Medication Sig Dispense Refill  . apixaban (ELIQUIS) 5 MG TABS tablet Take 1 tablet (5 mg total) by mouth 2 (two)  times daily. DO NOT TAKE THIS MEDICATION UNTIL WE RESTART IT AFTER YOUR SURGERY.    Marland Kitchen aspirin 81 MG EC tablet Take 81 mg by mouth daily.     Marland Kitchen atorvastatin (LIPITOR) 20 MG tablet Take 1 tablet by mouth once daily (Patient taking differently: Take 20 mg by mouth daily. ) 90 tablet 2  . furosemide (LASIX) 40 MG tablet Take 40 mg by mouth 2 (two) times daily with a meal.     . hydrALAZINE (APRESOLINE) 100 MG tablet TAKE 1 TABLET BY MOUTH THREE TIMES DAILY (Patient taking differently: Take 100 mg by mouth 2 (two) times daily. ) 270 tablet 0  . insulin NPH-regular Human (70-30) 100 UNIT/ML injection Inject 46-56 Units into the skin daily with breakfast.    . lisinopril (ZESTRIL) 10 MG tablet Take 1 tablet (10 mg total) by mouth daily.    . metoprolol tartrate (LOPRESSOR) 100 MG tablet Take 1 tablet by mouth twice daily 180 tablet 0  . prazosin (MINIPRESS) 2 MG capsule TAKE 1 CAPSULE BY MOUTH IN THE EVENING AFTER SUPPER (Patient taking differently: Take 2 mg by mouth daily after supper. ) 90 capsule 0  . ACCU-CHEK FASTCLIX LANCETS MISC Check blood sugar TID & QHS 102 each 2  . blood glucose meter kit and supplies KIT Check blood sugar TID & QHS 1 each 0  . Blood Glucose Monitoring Suppl (ACCU-CHEK ADVANTAGE DIABETES) kit Use as instructed 1 each 0  . glucose blood (ACCU-CHEK AVIVA PLUS) test  strip Use as instructed 100 each 12  . glucose blood (CHOICE DM FORA G20 TEST STRIPS) test strip Check blood sugar TID & QHS 100 each 12  . glucose blood test strip Use as instructed 100 each 12  . Multiple Vitamin (MULTIVITAMIN WITH MINERALS) TABS tablet Take 1 tablet by mouth daily. (Patient not taking: Reported on 05/23/2020)    . oxyCODONE (OXY IR/ROXICODONE) 5 MG immediate release tablet Take 1-2 tablets (5-10 mg total) by mouth every 4 (four) hours as needed for moderate pain (pain score 4-6). (Patient not taking: Reported on 05/23/2020) 30 tablet 0    Allergies as of 05/23/2020 - Review Complete 05/23/2020    Allergen Reaction Noted  . Keflex [cephalexin] Nausea And Vomiting 12/25/2019  . Pletal [cilostazol] Palpitations 09/18/2017  . Sulfa antibiotics Rash 08/14/2013    Family History  Problem Relation Age of Onset  . Diabetes type II Mother   . Hypertension Mother   . Diabetes Mother   . Liver cancer Father   . Diabetes type II Sister   . Breast cancer Sister   . Diabetes Sister   . Hypertension Sister   . Diabetes type II Brother   . Kidney failure Brother   . Diabetes Brother   . Hypertension Brother   . Diabetes type II Sister     Social History   Socioeconomic History  . Marital status: Married    Spouse name: Not on file  . Number of children: 2  . Years of education: Not on file  . Highest education level: Not on file  Occupational History  . Occupation: Disabled  Tobacco Use  . Smoking status: Former Smoker    Packs/day: 1.50    Years: 30.00    Pack years: 45.00    Types: Cigars    Quit date: 10/07/2014    Years since quitting: 5.6  . Smokeless tobacco: Never Used  Vaping Use  . Vaping Use: Never used  Substance and Sexual Activity  . Alcohol use: Yes    Alcohol/week: 6.0 standard drinks    Types: 6 Cans of beer per week    Comment: 2-3 beers daily  . Drug use: No  . Sexual activity: Not on file  Other Topics Concern  . Not on file  Social History Narrative  . Not on file   Social Determinants of Health   Financial Resource Strain:   . Difficulty of Paying Living Expenses:   Food Insecurity:   . Worried About Charity fundraiser in the Last Year:   . Arboriculturist in the Last Year:   Transportation Needs:   . Film/video editor (Medical):   Marland Kitchen Lack of Transportation (Non-Medical):   Physical Activity:   . Days of Exercise per Week:   . Minutes of Exercise per Session:   Stress:   . Feeling of Stress :   Social Connections:   . Frequency of Communication with Friends and Family:   . Frequency of Social Gatherings with Friends and  Family:   . Attends Religious Services:   . Active Member of Clubs or Organizations:   . Attends Archivist Meetings:   Marland Kitchen Marital Status:   Intimate Partner Violence:   . Fear of Current or Ex-Partner:   . Emotionally Abused:   Marland Kitchen Physically Abused:   . Sexually Abused:     Review of Systems: All systems reviewed and negative except where noted in HPI.  Physical Exam: Vital signs in last  24 hours: Temp:  [97.7 F (36.5 C)-98.1 F (36.7 C)] 97.7 F (36.5 C) (07/12 1545) Pulse Rate:  [60-89] 61 (07/12 1600) Resp:  [12-18] 13 (07/12 1600) BP: (172-202)/(70-84) 186/76 (07/12 1600) SpO2:  [13 %-99 %] 97 % (07/12 1600) Weight:  [122.5 kg] 122.5 kg (07/12 1251)   General:   Alert, well-developed,  male in NAD Psych:  Pleasant, cooperative. Normal mood and affect. Eyes:  Pupils equal, sclera clear, no icterus.   Conjunctiva pink. Ears:  Normal auditory acuity. Nose:  No deformity, discharge,  or lesions. Neck:  Supple; no masses Lungs:  Clear throughout to auscultation.   No wheezes, crackles, or rhonchi.  Heart:  Regular rate and rhythm; no murmurs, no lower extremity edema Abdomen:  Soft, non-distended, nontender, BS active, no palp mass   Rectal:  No repeat. Stool dark, heme + on EDP exam Msk:  Right BKA, partial amputation left foot. Neurologic:  Alert and  oriented x4;  grossly normal neurologically. Skin:  Intact without significant lesions or rashes.   Intake/Output from previous day: No intake/output data recorded. Intake/Output this shift: No intake/output data recorded.  Lab Results: Recent Labs    05/23/20 1319  WBC 5.7  HGB 11.5*  HCT 35.9*  PLT 143*   BMET Recent Labs    05/23/20 1319  NA 135  K 5.8*  CL 105  CO2 16*  GLUCOSE 142*  BUN 97*  CREATININE 6.78*  CALCIUM 8.4*   LFT Recent Labs    05/23/20 1319  PROT 6.9  ALBUMIN 3.5  AST 14*  ALT 20  ALKPHOS 105  BILITOT 0.3   PT/INR No results for input(s): LABPROT, INR in the  last 72 hours. Hepatitis Panel No results for input(s): HEPBSAG, HCVAB, HEPAIGM, HEPBIGM in the last 72 hours.   . CBC Latest Ref Rng & Units 05/23/2020 05/12/2020 05/05/2020  WBC 4.0 - 10.5 K/uL 5.7 - -  Hemoglobin 13.0 - 17.0 g/dL 11.5(L) 12.2(L) 11.8(L)  Hematocrit 39 - 52 % 35.9(L) - -  Platelets 150 - 400 K/uL 143(L) - -    . CMP Latest Ref Rng & Units 05/23/2020 03/11/2020 02/20/2020  Glucose 70 - 99 mg/dL 142(H) 317(H) 298(H)  BUN 8 - 23 mg/dL 97(H) 78(H) 69(H)  Creatinine 0.61 - 1.24 mg/dL 6.78(H) 2.51(H) 2.97(H)  Sodium 135 - 145 mmol/L 135 133(L) 133(L)  Potassium 3.5 - 5.1 mmol/L 5.8(H) 4.7 5.0  Chloride 98 - 111 mmol/L 105 103 100  CO2 22 - 32 mmol/L 16(L) 19(L) 21(L)  Calcium 8.9 - 10.3 mg/dL 8.4(L) 8.6(L) 8.4(L)  Total Protein 6.5 - 8.1 g/dL 6.9 - -  Total Bilirubin 0.3 - 1.2 mg/dL 0.3 - -  Alkaline Phos 38 - 126 U/L 105 - -  AST 15 - 41 U/L 14(L) - -  ALT 0 - 44 U/L 20 - -   Studies/Results: No results found.  Active Problems:   * No active hospital problems. Tye Savoy, NP-C @  05/23/2020, 4:35 PM

## 2020-05-23 NOTE — ED Notes (Signed)
ED Provider at bedside. 

## 2020-05-24 ENCOUNTER — Encounter (HOSPITAL_COMMUNITY): Payer: Self-pay | Admitting: Internal Medicine

## 2020-05-24 ENCOUNTER — Inpatient Hospital Stay (HOSPITAL_COMMUNITY): Payer: Medicare Other | Admitting: Anesthesiology

## 2020-05-24 ENCOUNTER — Encounter (HOSPITAL_COMMUNITY)
Admission: EM | Disposition: A | Discharge status: Home / Self Care | Payer: Self-pay | Source: Home / Self Care | Attending: Internal Medicine

## 2020-05-24 ENCOUNTER — Inpatient Hospital Stay (HOSPITAL_COMMUNITY): Payer: Medicare Other

## 2020-05-24 ENCOUNTER — Ambulatory Visit: Payer: Medicare Other | Admitting: Physician Assistant

## 2020-05-24 ENCOUNTER — Other Ambulatory Visit: Payer: Self-pay

## 2020-05-24 DIAGNOSIS — R195 Other fecal abnormalities: Secondary | ICD-10-CM | POA: Insufficient documentation

## 2020-05-24 DIAGNOSIS — N179 Acute kidney failure, unspecified: Secondary | ICD-10-CM

## 2020-05-24 DIAGNOSIS — I739 Peripheral vascular disease, unspecified: Secondary | ICD-10-CM

## 2020-05-24 DIAGNOSIS — I340 Nonrheumatic mitral (valve) insufficiency: Secondary | ICD-10-CM

## 2020-05-24 DIAGNOSIS — I361 Nonrheumatic tricuspid (valve) insufficiency: Secondary | ICD-10-CM

## 2020-05-24 DIAGNOSIS — K921 Melena: Secondary | ICD-10-CM | POA: Insufficient documentation

## 2020-05-24 DIAGNOSIS — I10 Essential (primary) hypertension: Secondary | ICD-10-CM

## 2020-05-24 DIAGNOSIS — I2510 Atherosclerotic heart disease of native coronary artery without angina pectoris: Secondary | ICD-10-CM

## 2020-05-24 DIAGNOSIS — K2980 Duodenitis without bleeding: Secondary | ICD-10-CM

## 2020-05-24 DIAGNOSIS — K922 Gastrointestinal hemorrhage, unspecified: Secondary | ICD-10-CM

## 2020-05-24 HISTORY — PX: ESOPHAGOGASTRODUODENOSCOPY (EGD) WITH PROPOFOL: SHX5813

## 2020-05-24 LAB — COMPREHENSIVE METABOLIC PANEL
ALT: 19 U/L (ref 0–44)
AST: 14 U/L — ABNORMAL LOW (ref 15–41)
Albumin: 3 g/dL — ABNORMAL LOW (ref 3.5–5.0)
Alkaline Phosphatase: 90 U/L (ref 38–126)
Anion gap: 13 (ref 5–15)
BUN: 111 mg/dL — ABNORMAL HIGH (ref 8–23)
CO2: 17 mmol/L — ABNORMAL LOW (ref 22–32)
Calcium: 8.2 mg/dL — ABNORMAL LOW (ref 8.9–10.3)
Chloride: 105 mmol/L (ref 98–111)
Creatinine, Ser: 6.99 mg/dL — ABNORMAL HIGH (ref 0.61–1.24)
GFR calc Af Amer: 9 mL/min — ABNORMAL LOW (ref >60)
GFR calc non Af Amer: 8 mL/min — ABNORMAL LOW (ref >60)
Glucose, Bld: 140 mg/dL — ABNORMAL HIGH (ref 70–99)
Potassium: 5 mmol/L (ref 3.5–5.1)
Sodium: 135 mmol/L (ref 135–145)
Total Bilirubin: 0.6 mg/dL (ref 0.3–1.2)
Total Protein: 6.4 g/dL — ABNORMAL LOW (ref 6.5–8.1)

## 2020-05-24 LAB — CBG MONITORING, ED
Glucose-Capillary: 108 mg/dL — ABNORMAL HIGH (ref 70–99)
Glucose-Capillary: 112 mg/dL — ABNORMAL HIGH (ref 70–99)
Glucose-Capillary: 121 mg/dL — ABNORMAL HIGH (ref 70–99)
Glucose-Capillary: 135 mg/dL — ABNORMAL HIGH (ref 70–99)
Glucose-Capillary: 156 mg/dL — ABNORMAL HIGH (ref 70–99)
Glucose-Capillary: 162 mg/dL — ABNORMAL HIGH (ref 70–99)
Glucose-Capillary: 190 mg/dL — ABNORMAL HIGH (ref 70–99)

## 2020-05-24 LAB — CBC
HCT: 31.8 % — ABNORMAL LOW (ref 39.0–52.0)
HCT: 26.1 % — ABNORMAL LOW (ref 39.0–52.0)
Hemoglobin: 10.5 g/dL — ABNORMAL LOW (ref 13.0–17.0)
Hemoglobin: 8.3 g/dL — ABNORMAL LOW (ref 13.0–17.0)
MCH: 29.2 pg (ref 26.0–34.0)
MCH: 29.1 pg (ref 26.0–34.0)
MCHC: 33 g/dL (ref 30.0–36.0)
MCHC: 31.8 g/dL (ref 30.0–36.0)
MCV: 88.6 fL (ref 80.0–100.0)
MCV: 91.6 fL (ref 80.0–100.0)
Platelets: 132 10*3/uL — ABNORMAL LOW (ref 150–400)
Platelets: 114 10*3/uL — ABNORMAL LOW (ref 150–400)
RBC: 3.59 MIL/uL — ABNORMAL LOW (ref 4.22–5.81)
RBC: 2.85 MIL/uL — ABNORMAL LOW (ref 4.22–5.81)
RDW: 14.3 % (ref 11.5–15.5)
RDW: 14.4 % (ref 11.5–15.5)
WBC: 6 10*3/uL (ref 4.0–10.5)
WBC: 4.4 10*3/uL (ref 4.0–10.5)
nRBC: 0 % (ref 0.0–0.2)
nRBC: 0 % (ref 0.0–0.2)

## 2020-05-24 LAB — ECHOCARDIOGRAM COMPLETE
Height: 72 in
Weight: 4320 oz

## 2020-05-24 SURGERY — ESOPHAGOGASTRODUODENOSCOPY (EGD) WITH PROPOFOL
Anesthesia: Monitor Anesthesia Care

## 2020-05-24 MED ORDER — PEG-KCL-NACL-NASULF-NA ASC-C 100 G PO SOLR: 1.0000 | Freq: Once | ORAL | Status: DC

## 2020-05-24 MED ORDER — PEG-KCL-NACL-NASULF-NA ASC-C 100 G PO SOLR
0.5000 | Freq: Once | ORAL | Status: AC
  Administered 2020-05-24: 100 g via ORAL
  Filled 2020-05-24: qty 1

## 2020-05-24 MED ORDER — PEG-KCL-NACL-NASULF-NA ASC-C 100 G PO SOLR
0.5000 | Freq: Once | ORAL | Status: AC
  Administered 2020-05-25: 100 g via ORAL
  Filled 2020-05-24: qty 1

## 2020-05-24 MED ORDER — LIDOCAINE 2% (20 MG/ML) 5 ML SYRINGE
INTRAMUSCULAR | Status: DC | PRN
  Administered 2020-05-24: 100 mg via INTRAVENOUS

## 2020-05-24 MED ORDER — PROPOFOL 500 MG/50ML IV EMUL
INTRAVENOUS | Status: AC
  Filled 2020-05-24: qty 50

## 2020-05-24 MED ORDER — PROPOFOL 10 MG/ML IV BOLUS
INTRAVENOUS | Status: DC | PRN
  Administered 2020-05-24 (×7): 20 mg via INTRAVENOUS
  Administered 2020-05-24: 40 mg via INTRAVENOUS
  Administered 2020-05-24 (×2): 20 mg via INTRAVENOUS
  Administered 2020-05-24: 60 mg via INTRAVENOUS
  Administered 2020-05-24 (×2): 20 mg via INTRAVENOUS
  Administered 2020-05-24: 40 mg via INTRAVENOUS

## 2020-05-24 SURGICAL SUPPLY — 15 items

## 2020-05-24 NOTE — Anesthesia Preprocedure Evaluation (Addendum)
Anesthesia Evaluation  Patient identified by MRN, date of birth, ID band Patient awake    Reviewed: Allergy & Precautions, NPO status , Patient's Chart, lab work & pertinent test results  History of Anesthesia Complications Negative for: history of anesthetic complications  Airway Mallampati: II  TM Distance: >3 FB Neck ROM: Full    Dental  (+) Dental Advisory Given, Poor Dentition, Missing, Chipped, Edentulous Upper,    Pulmonary former smoker,    Pulmonary exam normal        Cardiovascular hypertension, Pt. on medications + CAD, + Cardiac Stents and + Peripheral Vascular Disease  Normal cardiovascular exam+ dysrhythmias Atrial Fibrillation + pacemaker      Neuro/Psych negative neurological ROS  negative psych ROS   GI/Hepatic negative GI ROS, (+)     substance abuse  alcohol use,   Endo/Other  diabetes, Insulin Dependent Obesity   Renal/GU CRFRenal disease     Musculoskeletal  (+) Arthritis ,   Abdominal   Peds  Hematology  (+) Blood dyscrasia, anemia ,  On eliquis    Anesthesia Other Findings Covid neg 4/29  Reproductive/Obstetrics                            Anesthesia Physical  Anesthesia Plan  ASA: IV  Anesthesia Plan: MAC   Post-op Pain Management:  Regional for Post-op pain   Induction: Intravenous  PONV Risk Score and Plan: 2  Airway Management Planned: Nasal Cannula, Simple Face Mask and Mask  Additional Equipment: None  Intra-op Plan:   Post-operative Plan:   Informed Consent: I have reviewed the patients History and Physical, chart, labs and discussed the procedure including the risks, benefits and alternatives for the proposed anesthesia with the patient or authorized representative who has indicated his/her understanding and acceptance.     Dental advisory given  Plan Discussed with: CRNA and Anesthesiologist  Anesthesia Plan Comments:          Anesthesia Quick Evaluation

## 2020-05-24 NOTE — Transfer of Care (Signed)
Immediate Anesthesia Transfer of Care Note  Patient: Alexander [name]  Procedure(s) Performed: ESOPHAGOGASTRODUODENOSCOPY (EGD) WITH PROPOFOL (N/A )  Patient Location: PACU  Anesthesia Type:MAC  Level of Consciousness: sedated  Airway & Oxygen Therapy: Patient Spontanous Breathing and Patient connected to face mask oxygen  Post-op Assessment: Report given to RN and Post -op Vital signs reviewed and stable  Post vital signs: Reviewed and stable  Last Vitals:  Vitals Value Taken Time  BP    Temp    Pulse 64 05/24/20 1517  Resp 13 05/24/20 1517  SpO2 99 % 05/24/20 1517  Vitals shown include unvalidated device data.  Last Pain:  Vitals:   05/24/20 1430  TempSrc: Oral  PainSc: 0-No pain         Complications: No complications documented.

## 2020-05-24 NOTE — Progress Notes (Signed)
[facility] Progress Note  Subjective: no c/o today, no n/v or SOB  Vitals:   05/24/20 0821 05/24/20 0852 05/24/20 0941 05/24/20 1015  BP: (!) 167/66 (!) 167/66 (!) 188/76 (!) 197/77  Pulse: 64 81 63 66  Resp: '17 16 13 13  ' Temp:      TempSrc:      SpO2: 97% 97% 98% 97%  Weight:      Height:        Exam:  alert, nad   no jvd  Chest cta bilat  Cor reg no RG  Abd soft ntnd no ascites   Ext R bka,  1+ pretib edema,    Alert, NF, ox3    UNa 86, UCr 40   UA 7.12 - clear rare bact, >50 rbc, 0-5 wbc   Renal US - 13.1/ 13.7 cm diffusely increased echogenicity with mild diffuse cortical thinning, consistent with chronic medical renal disease. No hydronephrosis.    CXR - borderline CM, no acute disease  Assessment/ Plan: 1. AoCKD 4 - b/l creat 2.5- 3.0, eGFR 21- 24 ml/min march-April 2021. 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 Here [place] neg for obstruction, may be combination GIB/ acei/ diuretics? Suspect this more acute than CKD. Urine lytes c/w advanced CKD. No uremic sx/sx, no indcation for HD yet. Cont saline IVF"s at 100/hr, check CXR /echo,  holding acei/ diuretic. Will follow. 2. GIB/ melena - Hb not real low, on DOAC/ ASA. For EGD.  3. HTN - cont hydralazine/ metop, holding acei/ lasix.  4. CAD 5.  PAD sp BKA 02/2020 6. Afib / sp PPM - eliquis on hold 7. DM2 - insulin dependent many years     Rob Jewell Ryans 05/24/2020, 10:48 AM   Recent Labs  Lab 05/23/20 1319 05/23/20 1319 05/23/20 1955 05/24/20 0507  K 5.8*  --   --  5.0  BUN 97*  --   --  111*  CREATININE 6.78*  --   --  6.99*  CALCIUM 8.4*  --   --  8.2*  HGB 11.5*   < > 11.3* 10.5*   < > = values in this interval not displayed.   Inpatient medications:  atorvastatin  20 mg Oral Daily   hydrALAZINE  100 mg Oral TID   insulin aspart  0-15 Units Subcutaneous Q4H   insulin glargine  5 Units Subcutaneous BID   metoprolol tartrate   100 mg Oral BID   pantoprazole (PROTONIX) IV  40 mg Intravenous Q12H   prazosin  2 mg Oral QPC supper    sodium chloride 125 mL/hr at 05/24/20 0505   acetaminophen **OR** acetaminophen, albuterol, labetalol, ondansetron **OR** ondansetron (ZOFRAN) IV

## 2020-05-24 NOTE — Progress Notes (Signed)
PROGRESS NOTE  Alexander [name]  ALP:[id number] DOB: 1961-07-19 DOA: 05/23/2020 PCP: [name], MD   Brief Narrative: Per HPI: Alexander [name] is a 59 y.o. male with medical history significant for A. fib on Eliquis,recent right TKA 4/30,CAD s/ PTCA 2015,PVD,s/p PM,HLD,CKD stage IV baseline creatinine 2.5-3 followed by Dr. Justin Mend on ASA/Lipitor/Lasix 40 mg bid/hydralazine,lisinopril,prazosin, diabetes mellitus on long-term insulin with NPH 70/30, 56  Ac BF and 46 U before supper to the ED for evaluation of black tarry stool for 2 days.Patient has had 3 bowel movements in the past 2 days,has not had any iron or Pepto-Bismol. He has had no previous history of GI bleed.  Patient is any chest pain, nausea, vomiting, abdominal pain fever or chills.  Patient reports she has had EGD and colonoscopy in 2015 and was unremarkable.   ED Course:Initial blood pressure in 200 and subsequently in 180s,room air, blood work showed hyperkalemia 5.8, metabolic acidosis bicarb 16, BUN 97 creatinine 6.7,hemoglobin 11.5.ED had dark brown stool heme positive. In ED he had 2 large-bore IV,Lokelma 10 g x 1,kept n.p.o.GI was consulted and admission was requested under hospitalist.  EGD performed 7/13 revealed duodenitis. Colonoscopy is recommended.   Assessment & Plan: Principal Problem:   GI bleeding Active Problems:   PAD (peripheral artery disease) (HCC)   Acute renal failure (HCC)   CAD (coronary artery disease), native coronary artery   Essential hypertension, benign   Acute hematogenous osteomyelitis of right foot (HCC)   Acute GI bleeding   Occult blood in stools   Melena  GI bleeding, duodenitis:  - PPI - Diet clears with prep today, planning colonoscopy 7/14.  - Monitor H/H, Hgb down 11.5 > 10.5 > 8.3 this PM with suspicion for hemodilution contributing (all cell lines down). Will recheck serially. Holding anticoagulation/ASA for now.   AKI on stage IV CKD: Baseline creatinine 2.5-3 due to diabetic  nephropathy. BUN elevation at least in part due to GIB. - Appreciate nephrology assistance. Holding diuretic, ACE.  - Continue IVF, monitor I/O. No indications for HD.   Hyperkalemia: Improved w/fluids, lokelma.  - Continue monitoring.    Metabolic acidosis: Normal anion gap. Due to renal impairment.   CAD s/ PTCA 2015, PVD, HLD, s/p PPM: No anginal complaints. With active bleeding and CAD, would use transfusion threshold of 8g/dl.  - Hold ASA, give BB, statin. - Echo pending   Uncontrolled hypertension/hypertension urgency:  - Continue hydralazine, prazosin ,hold lisinopril,hold lasix  - Continue prn labetalol.  PAF: Currently NSR - Continue metoprolol, holding AC as above.  PAD s/p TKA 4/30: Appears to have normal healing.  IDT2DM: HbA1c 8.6%.  - Continue current management, CBGs at inpatient goal.   Morbid obesity: Body mass index is 36.62 kg/m.   Thrombocytopenia:  - Hold DOAC/ASA as above.   DVT prophylaxis: SCDs Code Status: Full Family Communication: None at bedside Disposition Plan:  Status is: Inpatient  Remains inpatient appropriate because:Ongoing diagnostic testing needed not appropriate for outpatient work up   Dispo: The patient is from: Home              Anticipated d/c is to: Home              Anticipated d/c date is: 2 days              Patient currently is not medically stable to d/c.  Consultants:   GI  Nephrology  Procedures:   EGD 05/24/2020:  Impression:       -  Normal esophagus.                           - Normal stomach.                           - Duodenitis.                           - Normal third portion of the duodenum.                           - No specimens collected. Recommendation:- Return patient to hospital ward for ongoing care.                           - Clear liquid diet today.                           - Continue present medications including                            pantoprazole 40 mg po bid.                            - Duodenitis is a potential source for blood loss.                            Recommend performing a colonoscopy tomorrow and                            will discuss with patient when he recovers from                            sedation.  Antimicrobials:  None   Subjective: Didn't sleep, he's hungry, no further bleeding that he knows of, no BMs overnight. Denies chest pain or dyspnea, no abd pain.   Objective: Vitals:   05/24/20 1530 05/24/20 1536 05/24/20 1610 05/24/20 1612  BP:  (!) 128/42 (!) 152/64 (!) 152/64  Pulse:  65 64 63  Resp:  _0 Temp:      TempSrc:      SpO2: 99% 99% 98% 96%  Weight:      Height:        Intake/Output Summary (Last 24 hours) at 05/24/2020 1658 Last data filed at 05/24/2020 1508 Gross per 24 hour  Intake 200 ml  Output 1200 ml  Net -1000 ml   Filed Weights   05/23/20 1251  Weight: 122.5 kg   Gen: 59 y.o. male in no distress Pulm: Non-labored breathing. Clear to auscultation bilaterally.  CV: Regular rate and rhythm. No murmur, rub, or gallop. No JVD, no pitting pedal edema. GI: Abdomen soft, non-tender, non-distended, with normoactive bowel sounds. No organomegaly or masses felt. Ext: Warm, no deformities Skin: No rashes, lesions or ulcers Neuro: Alert and oriented. No focal neurological deficits. Psych: Judgement and insight appear normal. Mood & affect appropriate.   Data Reviewed: I have personally reviewed following labs and imaging studies  CBC: Recent Labs  Lab 05/23/20 1319 05/23/20 1955 05/24/20 0507  WBC 5.7 6.0  6.0  HGB 11.5* 11.3* 10.5*  HCT 35.9* 34.6* 31.8*  MCV 89.1 89.9 88.6  PLT 143* 138* 735*   Basic Metabolic Panel: Recent Labs  Lab 05/23/20 1319 05/24/20 0507  NA 135 135  K 5.8* 5.0  CL 105 105  CO2 16* 17*  GLUCOSE 142* 140*  BUN 97* 111*  CREATININE 6.78* 6.99*  CALCIUM 8.4* 8.2*   GFR: Estimated Creatinine Clearance: 15 mL/min (A) (by C-G formula based on SCr of 6.99 mg/dL  (H)). Liver Function Tests: Recent Labs  Lab 05/23/20 1319 05/24/20 0507  AST 14* 14*  ALT 20 19  ALKPHOS 105 90  BILITOT 0.3 0.6  PROT 6.9 6.4*  ALBUMIN 3.5 3.0*   No results for input(s): LIPASE, AMYLASE in the last 168 hours. No results for input(s): AMMONIA in the last 168 hours. Coagulation Profile: No results for input(s): INR, PROTIME in the last 168 hours. Cardiac Enzymes: No results for input(s): CKTOTAL, CKMB, CKMBINDEX, TROPONINI in the last 168 hours. BNP (last 3 results) No results for input(s): PROBNP in the last 8760 hours. HbA1C: Recent Labs    05/23/20 1955  HGBA1C 5.5   CBG: Recent Labs  Lab 05/24/20 0020 05/24/20 0404 05/24/20 0836 05/24/20 1352 05/24/20 1611  GLUCAP 190* 112* 121* 156* 135*   Lipid Profile: No results for input(s): CHOL, HDL, LDLCALC, TRIG, CHOLHDL, LDLDIRECT in the last 72 hours. Thyroid Function Tests: No results for input(s): TSH, T4TOTAL, FREET4, T3FREE, THYROIDAB in the last 72 hours. Anemia Panel: No results for input(s): VITAMINB12, FOLATE, FERRITIN, TIBC, IRON, RETICCTPCT in the last 72 hours. Urine analysis:    Component Value Date/Time   COLORURINE YELLOW 05/23/2020 2057   APPEARANCEUR CLEAR 05/23/2020 2057   LABSPEC 1.008 05/23/2020 2057   PHURINE 5.0 05/23/2020 2057   GLUCOSEU 50 (A) 05/23/2020 2057   HGBUR LARGE (A) 05/23/2020 2057   BILIRUBINUR NEGATIVE 05/23/2020 2057   BILIRUBINUR neg 10/01/2014 1445   KETONESUR NEGATIVE 05/23/2020 2057   PROTEINUR 100 (A) 05/23/2020 2057   UROBILINOGEN 0.2 10/01/2014 1445   UROBILINOGEN 0.2 09/30/2014 2051   NITRITE NEGATIVE 05/23/2020 2057   LEUKOCYTESUR NEGATIVE 05/23/2020 2057   Recent Results (from the past 240 hour(s))  SARS Coronavirus 2 by RT PCR (hospital order, performed in [facility] hospital lab) Nasopharyngeal Nasopharyngeal Swab     Status: None   Collection Time: 05/23/20  4:00 PM   Specimen: Nasopharyngeal Swab  Result Value Ref Range Status   SARS  Coronavirus 2 NEGATIVE NEGATIVE Final    Comment: (NOTE) SARS-CoV-2 target nucleic acids are NOT DETECTED.  The SARS-CoV-2 RNA is generally detectable in upper and lower respiratory specimens during the acute phase of infection. The lowest concentration of SARS-CoV-2 viral copies this assay can detect is 250 copies / mL. A negative result does not preclude SARS-CoV-2 infection and should not be used as the sole basis for treatment or other patient management decisions.  A negative result may occur with improper specimen collection / handling, submission of specimen other than nasopharyngeal swab, presence of viral mutation(s) within the areas targeted by this assay, and inadequate number of viral copies (<250 copies / mL). A negative result must be combined with clinical observations, patient history, and epidemiological information.  Fact Sheet for Patients:   StrictlyIdeas.no  Fact Sheet for Healthcare Providers: BankingDealers.co.za  This test is not yet approved or  cleared by the [place] FDA and has been authorized for detection and/or diagnosis of SARS-CoV-2 by FDA under an Emergency  Use Authorization (EUA).  This EUA will remain in effect (meaning this test can be used) for the duration of the COVID-19 declaration under Section 564(b)(1) of the Act, 21 U.S.C. section 360bbb-3(b)(1), unless the authorization is terminated or revoked sooner.  Performed at [facility], [place] [address] [place], [place] [address]       Radiology Studies: US RENAL  Result Date: 05/23/2020 CLINICAL DATA:  Initial evaluation for acute renal insufficiency. EXAM: RENAL / URINARY TRACT ULTRASOUND COMPLETE COMPARISON:  None. FINDINGS: Right Kidney: Renal measurements: 13.1 x 6.4 x 8.7 cm = volume: 370.2 mL. Diffusely increased echogenicity within the renal parenchyma with mild diffuse cortical thinning. No nephrolithiasis or  hydronephrosis. No focal renal mass. Left Kidney: Renal measurements: 13.7 x 7.1 x 7.1 cm = volume: 361.6 mL. Increased echogenicity within the renal parenchyma with mild diffuse cortical thinning. No nephrolithiasis or hydronephrosis. No focal renal mass. Bladder: Appears normal for degree of bladder distention. Other: None. IMPRESSION: 1. Diffusely increased echogenicity within the renal parenchyma with underlying mild diffuse cortical thinning, consistent with chronic medical renal disease. 2. No hydronephrosis or other significant finding. Electronically Signed   By: Jeannine Boga M.D.   On: 05/23/2020 21:30   DG CHEST PORT 1 VIEW  Result Date: 05/24/2020 CLINICAL DATA:  Atrial fibrillation. EXAM: PORTABLE CHEST 1 VIEW COMPARISON:  October 09, 2014 FINDINGS: Lungs are clear. Heart is borderline enlarged with pacemaker leads attached to right atrium and right ventricle. Pulmonary vascularity is normal. No adenopathy. No bone lesions. No pneumothorax. IMPRESSION: Heart borderline enlarged. Pacemaker leads attached to right atrium and right ventricle. Lungs clear. Electronically Signed   By: Lowella Grip III M.D.   On: 05/24/2020 10:12    Scheduled Meds: . atorvastatin  20 mg Oral Daily  . hydrALAZINE  100 mg Oral TID  . insulin aspart  0-15 Units Subcutaneous Q4H  . insulin glargine  5 Units Subcutaneous BID  . metoprolol tartrate  100 mg Oral BID  . pantoprazole (PROTONIX) IV  40 mg Intravenous Q12H  . peg 3350 powder  1 kit Oral Once  . prazosin  2 mg Oral QPC supper   Continuous Infusions: . sodium chloride 100 mL/hr at 05/24/20 1454     LOS: 1 day   Time spent: 25 minutes.  Patrecia Pour, MD [facility] [email or web address] 05/24/2020, 4:58 PM

## 2020-05-24 NOTE — Interval H&P Note (Signed)
History and Physical Interval Note:  05/24/2020 2:49 PM  Alexander [name]  has presented today for surgery, with the diagnosis of upper GI bleed.  The various methods of treatment have been discussed with the patient and family. After consideration of risks, benefits and other options for treatment, the patient has consented to  Procedure(s): ESOPHAGOGASTRODUODENOSCOPY (EGD) WITH PROPOFOL (N/A) as a surgical intervention.  The patient's history has been reviewed, patient examined, no change in status, stable for surgery.  I have reviewed the patient's chart and labs.  Questions were answered to the patient's satisfaction.     Pricilla Riffle. Fuller Plan

## 2020-05-24 NOTE — Anesthesia Preprocedure Evaluation (Addendum)
Anesthesia Evaluation  Patient identified by MRN, date of birth, ID band  Reviewed: Allergy & Precautions, NPO status , Patient's Chart, lab work & pertinent test results  Airway Mallampati: III  TM Distance: >3 FB Neck ROM: Full    Dental no notable dental hx. (+) Poor Dentition, Dental Advisory Given,    Pulmonary neg pulmonary ROS, former smoker,    Pulmonary exam normal breath sounds clear to auscultation       Cardiovascular hypertension, + CAD and + Peripheral Vascular Disease  Normal cardiovascular exam+ dysrhythmias  Rhythm:Regular Rate:Normal  05/24/20 Echo 1. Left ventricular ejection fraction, by estimation, is 60 to 65%. The  left ventricle has normal function. The left ventricle has no regional  wall motion abnormalities. There is severe concentric left ventricular  hypertrophy. Left ventricular diastolic  parameters are consistent with Grade II diastolic dysfunction  (pseudonormalization).  2. Right ventricular systolic function is normal. The right ventricular  size is normal. There is mildly elevated pulmonary artery systolic  pressure.    Neuro/Psych    GI/Hepatic   Endo/Other  diabetes  Renal/GU CRFRenal diseaseLab Results      Component                Value               Date                      CREATININE               6.99 (H)            05/24/2020                BUN                      111 (H)             05/24/2020                NA                       135                 05/24/2020                K                        5.0                 05/24/2020                CL                       105                 05/24/2020                CO2                      17 (L)              05/24/2020                Musculoskeletal   Abdominal (+) + obese,   Peds  Hematology  (+) anemia , Lab Results      Component  Value               Date                      WBC                       4.4                 05/24/2020                HGB                      8.3 (L)             05/24/2020                HCT                      26.1 (L)            05/24/2020                MCV                      91.6                05/24/2020                PLT                      114 (L)             05/24/2020              Anesthesia Other Findings   Reproductive/Obstetrics                            Anesthesia Physical Anesthesia Plan  ASA: III  Anesthesia Plan: MAC   Post-op Pain Management:    Induction:   PONV Risk Score and Plan: Treatment may vary due to age or medical condition  Airway Management Planned: Natural Airway and Nasal Cannula  Additional Equipment: None  Intra-op Plan:   Post-operative Plan:   Informed Consent: I have reviewed the patients History and Physical, chart, labs and discussed the procedure including the risks, benefits and alternatives for the proposed anesthesia with the patient or authorized representative who has indicated his/her understanding and acceptance.     Dental advisory given  Plan Discussed with:   Anesthesia Plan Comments:        Anesthesia Quick Evaluation

## 2020-05-24 NOTE — Progress Notes (Signed)
Echocardiogram 2D Echocardiogram has been performed.  Oneal Deputy Anaclara Acklin 05/24/2020, 1:38 PM

## 2020-05-24 NOTE — Anesthesia Procedure Notes (Signed)
Date/Time: 05/24/2020 2:56 PM Performed by: Talbot Grumbling, CRNA Oxygen Delivery Method: Simple face mask

## 2020-05-24 NOTE — Op Note (Signed)
[facility] Patient Name: Alexander [name] Procedure Date: 05/24/2020 MRN: [id number] Attending MD: Ladene Artist , MD Date of Birth: [birth year] CSN: [id number] Age: 59 Admit Type: Inpatient Procedure:                Upper GI endoscopy Indications:              Heme positive stool, Melena Providers:                Pricilla Riffle. Fuller Plan, MD, Carmie End, RN, Cherylynn Ridges, Technician, Marla Roe, CRNA Referring MD:             [facility] Medicines:                Monitored Anesthesia Care Complications:            No immediate complications. Estimated Blood Loss:     Estimated blood loss: none. Procedure:                Pre-Anesthesia Assessment:                           - Prior to the procedure, a History and Physical                            was performed, and patient medications and                            allergies were reviewed. The patient's tolerance of                            previous anesthesia was also reviewed. The risks                            and benefits of the procedure and the sedation                            options and risks were discussed with the patient.                            All questions were answered, and informed consent                            was obtained. Prior Anticoagulants: The patient has                            taken Eliquis (apixaban), last dose was 2 days                            prior to procedure. ASA Grade Assessment: III - A                            patient with severe systemic disease. After  reviewing the risks and benefits, the patient was                            deemed in satisfactory condition to undergo the                            procedure.                           After obtaining informed consent, the endoscope was                            passed under direct vision. Throughout the                            procedure, the patient's blood  pressure, pulse, and                            oxygen saturations were monitored continuously. The                            [id number]-H190 ([id number]) Olympus gastroscope was                            introduced through the mouth, and advanced to the                            third part of duodenum. The upper GI endoscopy was                            accomplished without difficulty. The patient                            tolerated the procedure well. Scope In: Scope Out: Findings:      The examined esophagus was normal.      The entire examined stomach was normal.      Diffuse moderate inflammation characterized by congestion (edema),       erythema and granularity was found in the duodenal bulb and in the       second portion of the duodenum.      The third portion of the duodenum was normal. Impression:               - Normal esophagus.                           - Normal stomach.                           - Duodenitis.                           - Normal third portion of the duodenum.                           - No specimens collected. Moderate Sedation:      Not Applicable - Patient had  care per Anesthesia. Recommendation:           - Return patient to hospital ward for ongoing care.                           - Clear liquid diet today.                           - Continue present medications including                            pantoprazole 40 mg po bid.                           - Duodenitis is a potential source for blood loss.                            Recommend performing a colonoscopy tomorrow and                            will discuss with patient when he recovers from                            sedation. Procedure Code(s):        --- Professional ---                           [phone], Esophagogastroduodenoscopy, flexible,                            transoral; diagnostic, including collection of                            specimen(s) by brushing or washing, when performed                             (separate procedure) Diagnosis Code(s):        --- Professional ---                           K29.80, Duodenitis without bleeding                           R19.5, Other fecal abnormalities                           K92.1, Melena (includes Hematochezia) CPT copyright 2019 American Medical Association. All rights reserved. The codes documented in this report are preliminary and upon coder review may  be revised to meet current compliance requirements. Ladene Artist, MD 05/24/2020 3:21:54 PM This report has been signed electronically. Number of Addenda: 0

## 2020-05-25 ENCOUNTER — Encounter (HOSPITAL_COMMUNITY): Payer: Self-pay | Admitting: Internal Medicine

## 2020-05-25 ENCOUNTER — Encounter (HOSPITAL_COMMUNITY)
Admission: EM | Disposition: A | Discharge status: Home / Self Care | Payer: Self-pay | Source: Home / Self Care | Attending: Internal Medicine

## 2020-05-25 ENCOUNTER — Inpatient Hospital Stay (HOSPITAL_COMMUNITY): Payer: Medicare Other | Admitting: Anesthesiology

## 2020-05-25 DIAGNOSIS — R195 Other fecal abnormalities: Secondary | ICD-10-CM | POA: Diagnosis not present

## 2020-05-25 HISTORY — PX: COLONOSCOPY WITH PROPOFOL: SHX5780

## 2020-05-25 LAB — COMPREHENSIVE METABOLIC PANEL
ALT: 16 U/L (ref 0–44)
ALT: 18 U/L (ref 0–44)
AST: 13 U/L — ABNORMAL LOW (ref 15–41)
AST: 11 U/L — ABNORMAL LOW (ref 15–41)
Albumin: 3.3 g/dL — ABNORMAL LOW (ref 3.5–5.0)
Albumin: 3.5 g/dL (ref 3.5–5.0)
Alkaline Phosphatase: 90 U/L (ref 38–126)
Alkaline Phosphatase: 96 U/L (ref 38–126)
Anion gap: 12 (ref 5–15)
Anion gap: 15 (ref 5–15)
BUN: 111 mg/dL — ABNORMAL HIGH (ref 8–23)
BUN: 112 mg/dL — ABNORMAL HIGH (ref 8–23)
CO2: 14 mmol/L — ABNORMAL LOW (ref 22–32)
CO2: 16 mmol/L — ABNORMAL LOW (ref 22–32)
Calcium: 7.9 mg/dL — ABNORMAL LOW (ref 8.9–10.3)
Calcium: 8.4 mg/dL — ABNORMAL LOW (ref 8.9–10.3)
Chloride: 113 mmol/L — ABNORMAL HIGH (ref 98–111)
Chloride: 110 mmol/L (ref 98–111)
Creatinine, Ser: 6.61 mg/dL — ABNORMAL HIGH (ref 0.61–1.24)
Creatinine, Ser: 7.1 mg/dL — ABNORMAL HIGH (ref 0.61–1.24)
GFR calc Af Amer: 10 mL/min — ABNORMAL LOW (ref >60)
GFR calc Af Amer: 9 mL/min — ABNORMAL LOW (ref >60)
GFR calc non Af Amer: 8 mL/min — ABNORMAL LOW (ref >60)
GFR calc non Af Amer: 8 mL/min — ABNORMAL LOW (ref >60)
Glucose, Bld: 113 mg/dL — ABNORMAL HIGH (ref 70–99)
Glucose, Bld: 118 mg/dL — ABNORMAL HIGH (ref 70–99)
Potassium: 4.9 mmol/L (ref 3.5–5.1)
Potassium: 5.6 mmol/L — ABNORMAL HIGH (ref 3.5–5.1)
Sodium: 139 mmol/L (ref 135–145)
Sodium: 141 mmol/L (ref 135–145)
Total Bilirubin: 0.5 mg/dL (ref 0.3–1.2)
Total Bilirubin: 0.5 mg/dL (ref 0.3–1.2)
Total Protein: 6.6 g/dL (ref 6.5–8.1)
Total Protein: 6.6 g/dL (ref 6.5–8.1)

## 2020-05-25 LAB — GLUCOSE, CAPILLARY
Glucose-Capillary: 103 mg/dL — ABNORMAL HIGH (ref 70–99)
Glucose-Capillary: 113 mg/dL — ABNORMAL HIGH (ref 70–99)
Glucose-Capillary: 137 mg/dL — ABNORMAL HIGH (ref 70–99)
Glucose-Capillary: 170 mg/dL — ABNORMAL HIGH (ref 70–99)
Glucose-Capillary: 237 mg/dL — ABNORMAL HIGH (ref 70–99)

## 2020-05-25 LAB — CBC
HCT: 26.6 % — ABNORMAL LOW (ref 39.0–52.0)
HCT: 32.3 % — ABNORMAL LOW (ref 39.0–52.0)
Hemoglobin: 8.4 g/dL — ABNORMAL LOW (ref 13.0–17.0)
Hemoglobin: 10.2 g/dL — ABNORMAL LOW (ref 13.0–17.0)
MCH: 29 pg (ref 26.0–34.0)
MCH: 29.1 pg (ref 26.0–34.0)
MCHC: 31.6 g/dL (ref 30.0–36.0)
MCHC: 31.6 g/dL (ref 30.0–36.0)
MCV: 91.7 fL (ref 80.0–100.0)
MCV: 92.3 fL (ref 80.0–100.0)
Platelets: 106 10*3/uL — ABNORMAL LOW (ref 150–400)
Platelets: 121 10*3/uL — ABNORMAL LOW (ref 150–400)
RBC: 2.9 MIL/uL — ABNORMAL LOW (ref 4.22–5.81)
RBC: 3.5 MIL/uL — ABNORMAL LOW (ref 4.22–5.81)
RDW: 14.5 % (ref 11.5–15.5)
RDW: 14.6 % (ref 11.5–15.5)
WBC: 4.5 10*3/uL (ref 4.0–10.5)
WBC: 4.4 10*3/uL (ref 4.0–10.5)
nRBC: 0 % (ref 0.0–0.2)
nRBC: 0 % (ref 0.0–0.2)

## 2020-05-25 LAB — CBG MONITORING, ED
Glucose-Capillary: 103 mg/dL — ABNORMAL HIGH (ref 70–99)
Glucose-Capillary: 114 mg/dL — ABNORMAL HIGH (ref 70–99)

## 2020-05-25 LAB — PHOSPHORUS: Phosphorus: 8.9 mg/dL — ABNORMAL HIGH (ref 2.5–4.6)

## 2020-05-25 LAB — MAGNESIUM: Magnesium: 1.8 mg/dL (ref 1.7–2.4)

## 2020-05-25 SURGERY — COLONOSCOPY WITH PROPOFOL
Anesthesia: Monitor Anesthesia Care

## 2020-05-25 MED ORDER — APIXABAN 5 MG PO TABS: 5.0000 mg | ORAL_TABLET | Freq: Two times a day (BID) | ORAL | Status: DC

## 2020-05-25 MED ORDER — THIAMINE HCL 100 MG/ML IJ SOLN
100.0000 mg | Freq: Every day | INTRAMUSCULAR | Status: DC
  Filled 2020-05-25 (×2): qty 2

## 2020-05-25 MED ORDER — LORAZEPAM 2 MG/ML IJ SOLN: 0.0000 mg | Freq: Two times a day (BID) | INTRAMUSCULAR | Status: AC

## 2020-05-25 MED ORDER — LORAZEPAM 2 MG/ML IJ SOLN: 1.0000 mg | INTRAMUSCULAR | Status: DC | PRN

## 2020-05-25 MED ORDER — PROPOFOL 500 MG/50ML IV EMUL
INTRAVENOUS | Status: DC | PRN
  Administered 2020-05-25: 100 ug/kg/min via INTRAVENOUS
  Administered 2020-05-25: 80 mg via INTRAVENOUS

## 2020-05-25 MED ORDER — LACTATED RINGERS IV SOLN: INTRAVENOUS | Status: DC | PRN

## 2020-05-25 MED ORDER — FOLIC ACID 1 MG PO TABS
1.0000 mg | ORAL_TABLET | Freq: Every day | ORAL | Status: DC
  Administered 2020-05-26 – 2020-06-05 (×11): 1 mg via ORAL
  Filled 2020-05-25 (×11): qty 1

## 2020-05-25 MED ORDER — SODIUM CHLORIDE 0.9 % IV SOLN
500.0000 mg | INTRAVENOUS | Status: AC
  Administered 2020-05-25 – 2020-05-27 (×3): 500 mg via INTRAVENOUS
  Filled 2020-05-25 (×3): qty 4

## 2020-05-25 MED ORDER — SODIUM ZIRCONIUM CYCLOSILICATE 10 G PO PACK
10.0000 g | PACK | Freq: Two times a day (BID) | ORAL | Status: DC
  Administered 2020-05-25 – 2020-05-29 (×9): 10 g via ORAL
  Filled 2020-05-25 (×13): qty 1

## 2020-05-25 MED ORDER — LORAZEPAM 2 MG/ML IJ SOLN: 0.0000 mg | Freq: Four times a day (QID) | INTRAMUSCULAR | Status: AC

## 2020-05-25 MED ORDER — LORAZEPAM 1 MG PO TABS: 1.0000 mg | ORAL_TABLET | ORAL | Status: DC | PRN

## 2020-05-25 MED ORDER — EPHEDRINE SULFATE-NACL 50-0.9 MG/10ML-% IV SOSY
PREFILLED_SYRINGE | INTRAVENOUS | Status: DC | PRN
  Administered 2020-05-25: 10 mg via INTRAVENOUS

## 2020-05-25 MED ORDER — ADULT MULTIVITAMIN W/MINERALS CH
1.0000 | ORAL_TABLET | Freq: Every day | ORAL | Status: DC
  Administered 2020-05-26 – 2020-06-05 (×11): 1 via ORAL
  Filled 2020-05-25 (×12): qty 1

## 2020-05-25 MED ORDER — THIAMINE HCL 100 MG PO TABS
100.0000 mg | ORAL_TABLET | Freq: Every day | ORAL | Status: DC
  Administered 2020-05-26 – 2020-06-05 (×11): 100 mg via ORAL
  Filled 2020-05-25 (×11): qty 1

## 2020-05-25 SURGICAL SUPPLY — 21 items

## 2020-05-25 NOTE — Anesthesia Postprocedure Evaluation (Signed)
Anesthesia Post Note  Patient: Alexander [name]  Procedure(s) Performed: COLONOSCOPY WITH PROPOFOL (N/A )     Patient location during evaluation: Endoscopy Anesthesia Type: MAC Level of consciousness: awake and alert Pain management: pain level controlled Vital Signs Assessment: post-procedure vital signs reviewed and stable Respiratory status: spontaneous breathing, nonlabored ventilation, respiratory function stable and patient connected to nasal cannula oxygen Cardiovascular status: blood pressure returned to baseline and stable Postop Assessment: no apparent nausea or vomiting Anesthetic complications: no   No complications documented.  Last Vitals:  Vitals:   05/25/20 1209 05/25/20 1310  BP: (!) 172/61 (!) 169/66  Pulse: 62 63  Resp: 14 16  Temp:  36.4 C  SpO2: 100% 99%    Last Pain:  Vitals:   05/25/20 1310  TempSrc: Oral  PainSc:                  Barnet Glasgow

## 2020-05-25 NOTE — Interval H&P Note (Signed)
History and Physical Interval Note:  05/25/2020 11:15 AM  Alexander [name]  has presented today for surgery, with the diagnosis of gastrointestinal bleeding.  The various methods of treatment have been discussed with the patient and family. After consideration of risks, benefits and other options for treatment, the patient has consented to  Procedure(s): COLONOSCOPY WITH PROPOFOL (N/A) as a surgical intervention.  The patient's history has been reviewed, patient examined, no change in status, stable for surgery.  I have reviewed the patient's chart and labs.  Questions were answered to the patient's satisfaction.     [name]

## 2020-05-25 NOTE — Op Note (Signed)
[facility] Patient Name: Alexander [name] Procedure Date: 05/25/2020 MRN: [id number] Attending MD: Gatha Mayer , MD Date of Birth: 12/04/1960 CSN: [id number] Age: 59 Admit Type: Inpatient Procedure:                Colonoscopy Indications:              Heme positive stool Providers:                Gatha Mayer, MD, Cleda Daub, RN, Janie                            Billups, Jerene Bears CRNA Referring MD:              Medicines:                Propofol per Anesthesia, Monitored Anesthesia Care Complications:            No immediate complications. Estimated Blood Loss:     Estimated blood loss: none. Procedure:                Pre-Anesthesia Assessment:                           - Prior to the procedure, a History and Physical                            was performed, and patient medications and                            allergies were reviewed. The patient's tolerance of                            previous anesthesia was also reviewed. The risks                            and benefits of the procedure and the sedation                            options and risks were discussed with the patient.                            All questions were answered, and informed consent                            was obtained. Prior Anticoagulants: The patient                            last took Eliquis (apixaban) 2 days prior to the                            procedure. ASA Grade Assessment: III - A patient                            with severe systemic disease. After reviewing the  risks and benefits, the patient was deemed in                            satisfactory condition to undergo the procedure.                           After obtaining informed consent, the colonoscope                            was passed under direct vision. Throughout the                            procedure, the patient's blood pressure, pulse, and                             oxygen saturations were monitored continuously. The                            PCF-H190DL ([id number] Olympus pediatric colonscope                            was introduced through the anus and advanced to the                            the terminal ileum, with identification of the                            appendiceal orifice and IC valve. The colonoscopy                            was performed without difficulty. The patient                            tolerated the procedure well. The quality of the                            bowel preparation was excellent. The terminal                            ileum, ileocecal valve, appendiceal orifice, and                            rectum were photographed. Scope In: 11:22:25 AM Scope Out: 11:38:06 AM Scope Withdrawal Time: 0 hours 10 minutes 22 seconds  Total Procedure Duration: 0 hours 15 minutes 41 seconds  Findings:      The perianal and digital rectal examinations were normal. Pertinent       negatives include normal prostate (size, shape, and consistency).      The terminal ileum appeared normal.      The entire examined colon appeared normal on direct and retroflexion       views. Impression:               - The examined portion of the ileum was normal.                           -  The entire examined colon is normal on direct and                            retroflexion views.                           - No specimens collected.                           - Personal history of colonic polyps. Moderate Sedation:      Not Applicable - Patient had care per Anesthesia. Recommendation:           - Return patient to hospital ward for ongoing care.                           - Repeat colonoscopy in 5 years for surveillance. 4                            adenomas 2015 - Dr. Hilarie Fredrickson                           - GI signining off - based upon what I know now I                            think decreased Hgb likely related to the acute                             kidney injury more than any other possible bleeding                            - he had darlk stools but did not clearly have                            melena                           - Resume Eliquis - ordered                           - If more issues raising ? of GI bleeding would                            consider a capsule endoscopy small bowel but not at                            this time                           - anemia management may require EPO support - per                            nephrology Procedure Code(s):        --- Professional ---  45378, Colonoscopy, flexible; diagnostic, including                            collection of specimen(s) by brushing or washing,                            when performed (separate procedure) Diagnosis Code(s):        --- Professional ---                           R19.5, Other fecal abnormalities CPT copyright 2019 [employer]. All rights reserved. The codes documented in this report are preliminary and upon coder review may  be revised to meet current compliance requirements. Gatha Mayer, MD 05/25/2020 11:46:16 AM This report has been signed electronically. Number of Addenda: 0

## 2020-05-25 NOTE — Anesthesia Postprocedure Evaluation (Signed)
Anesthesia Post Note  Patient: Alexander [name]  Procedure(s) Performed: ESOPHAGOGASTRODUODENOSCOPY (EGD) WITH PROPOFOL (N/A )     Patient location during evaluation: PACU Anesthesia Type: MAC Level of consciousness: awake and alert Pain management: pain level controlled Vital Signs Assessment: post-procedure vital signs reviewed and stable Respiratory status: spontaneous breathing, nonlabored ventilation, respiratory function stable and patient connected to nasal cannula oxygen Cardiovascular status: stable and blood pressure returned to baseline Postop Assessment: no apparent nausea or vomiting Anesthetic complications: no   No complications documented.  Last Vitals:  Vitals:   05/25/20 0755 05/25/20 0800  BP: (!) 153/71 (!) 162/69  Pulse: 94 67  Resp: 15 13  Temp:    SpO2: 98% 97%    Last Pain:  Vitals:   05/24/20 1536  TempSrc:   PainSc: 0-No pain                 Jasean Ambrosia

## 2020-05-25 NOTE — Progress Notes (Signed)
[facility] Progress Note  Subjective: no c/o today, creat no better or worse, good UOP, tea-colored urine in the urinal  Vitals:   05/25/20 1143 05/25/20 1159 05/25/20 1209 05/25/20 1310  BP: (!) 129/51 (!) 139/57 (!) 172/61 (!) 169/66  Pulse:  68 62 63  Resp: 17 (!) '21 14 16  ' Temp: 97.8 F (36.6 C)   97.6 F (36.4 C)  TempSrc: Oral   Oral  SpO2: 97% 98% 100% 99%  Weight:      Height:        Exam:  alert, nad   no jvd  Chest cta bilat  Cor reg no RG  Abd soft ntnd no ascites   Ext R bka,  1+ pretib edema,    Alert, NF, ox3    UNa 86, UCr 40   UA 7.12 - clear rare bact, >50 rbc, 0-5 wbc   Renal US - 13.1/ 13.7 cm diffusely increased echogenicity with mild diffuse cortical thinning, consistent with chronic medical renal disease. No hydronephrosis.    CXR - borderline CM, no acute disease  Assessment/ Plan: 1. AoCKD 4 - b/l creat 2.5- 3.0, eGFR 21- 24 ml/min march-April 2021. CKD likely diab nephropathy underlying but he urine here has RBC's, is tea-colored and the sediments is active w/ RBC casts. 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 Recommend a kidney biopsy, pt / wife in agreement, IR consulted. Also give IV bolus steroids 543m solumedrol/ d x 3.  2. GIB/ melena - Hb not real low, on DOAC/ ASA. Sp EGD and colonscopy.  3. HTN - cont hydralazine/ metop, holding acei/ lasix.  4. CAD 5.  PAD sp BKA 02/2020 6. Afib / sp PPM - eliquis on hold 7. DM2 - insulin dependent many years     Rob Zena Vitelli 05/25/2020, 4:19 PM   Recent Labs  Lab 05/25/20 0513 05/25/20 1308  K 4.9 5.6*  BUN 111* 112*  CREATININE 6.61* 7.10*  CALCIUM 7.9* 8.4*  PHOS  --  8.9*  HGB 8.4* 10.2*   Inpatient medications: . atorvastatin  20 mg Oral Daily  . folic acid  1 mg Oral Daily  . hydrALAZINE  100 mg Oral TID  . insulin aspart  0-15 Units Subcutaneous Q4H  . insulin glargine  5 Units Subcutaneous BID  . LORazepam  0-4 mg Intravenous Q6H   Followed by  . [START ON 05/27/2020] LORazepam  0-4  mg Intravenous Q12H  . metoprolol tartrate  100 mg Oral BID  . multivitamin with minerals  1 tablet Oral Daily  . pantoprazole (PROTONIX) IV  40 mg Intravenous Q12H  . prazosin  2 mg Oral QPC supper  . sodium zirconium cyclosilicate  10 g Oral BID  . thiamine  100 mg Oral Daily   Or  . thiamine  100 mg Intravenous Daily   . sodium chloride 100 mL/hr at 05/25/20 1318  . methylPREDNISolone (SOLU-MEDROL) injection     acetaminophen **OR** acetaminophen, albuterol, labetalol, LORazepam **OR** LORazepam, ondansetron **OR** ondansetron (ZOFRAN) IV

## 2020-05-25 NOTE — Progress Notes (Signed)
PT Cancellation Note  Patient Details Name: Alexander [name] MRN: [id number] DOB: 1961/08/11   Cancelled Treatment:    Reason Eval/Treat Not Completed: Medical issues which prohibited therapy, appears to be prepping for colonoscopy. Will check back another time.   Claretha Cooper 05/25/2020, 7:12 AM [name] Pager [phone] Office [phone]

## 2020-05-25 NOTE — Progress Notes (Signed)
PROGRESS NOTE  Alexander [name]  BTD:[id number] DOB: [birth year] DOA: 05/23/2020 PCP: [name], MD   Brief Narrative: Per HPI: Alexander [name] is a 59 y.o. male with medical history significant for A. fib on Eliquis,recent right TKA 4/30,CAD s/ PTCA 2015,PVD,s/p PM,HLD,CKD stage IV baseline creatinine 2.5-3 followed by Dr. Justin Mend on ASA/Lipitor/Lasix 40 mg bid/hydralazine,lisinopril,prazosin, diabetes mellitus on long-term insulin with NPH 70/30, 56  Ac BF and 46 U before supper to the ED for evaluation of black tarry stool for 2 days.Patient has had 3 bowel movements in the past 2 days,has not had any iron or Pepto-Bismol. He has had no previous history of GI bleed.  Patient is any chest pain, nausea, vomiting, abdominal pain fever or chills.  Patient reports she has had EGD and colonoscopy in 2015 and was unremarkable.   ED Course:Initial blood pressure in 200 and subsequently in 180s,room air, blood work showed hyperkalemia 5.8, metabolic acidosis bicarb 16, BUN 97 creatinine 6.7,hemoglobin 11.5.ED had dark brown stool heme positive. In ED he had 2 large-bore IV,Lokelma 10 g x 1,kept n.p.o.GI was consulted and admission was requested under hospitalist.  EGD performed 7/13 revealed duodenitis. Colonoscopy is recommended.   Assessment & Plan: GI bleeding, duodenitis:  - PPI - Monitor H/H, Hgb down 11.5 > 10.5 > 8.3  - suspicion for hemodilution contributing (all cell lines down).  AKI on stage IV CKD: Baseline creatinine 2.5-3 due to diabetic nephropathy. BUN elevation at least in part due to GIB. - Appreciate nephrology assistance. Holding diuretic, ACE.  - Continue IVF, monitor I/O. No indications for HD.  - added lokelma and will need renal biopsy  Hyperkalemia: Improved w/fluids, lokelma.  - Continue monitoring.    Metabolic acidosis: Normal anion gap.  Due to renal impairment.   CAD s/ PTCA 2015, PVD, HLD, s/p PPM: No anginal complaints. With active bleeding and CAD, would use  transfusion threshold of 8g/dl.  - Hold ASA, give BB, statin.  Uncontrolled hypertension/hypertension urgency:  - Continue hydralazine, prazosin ,hold lisinopril,hold lasix  - Continue prn labetalol.  PAF: Currently NSR - Continue metoprolol, holding AC as above.  PAD s/p TKA 4/30: Appears to have normal healing.  IDT2DM: HbA1c 8.6%.  - Continue current management, CBGs at inpatient goal.   Morbid obesity: Body mass index is 36.62 kg/m.   Thrombocytopenia:  - Hold DOAC/ASA as above.   DVT prophylaxis: SCDs Code Status: Full Family Communication: None at bedside Disposition Plan:  Status is: Inpatient  Remains inpatient appropriate because:Ongoing diagnostic testing needed not appropriate for outpatient work up   Dispo: The patient is from: Home              Anticipated d/c is to: Home              Anticipated d/c date is: 2 days              Patient currently is not medically stable to d/c.  Consultants:   GI  Nephrology  Procedures:   EGD 05/24/2020:  Impression:       - Normal esophagus.                           - Normal stomach.                           - Duodenitis.                           -  Normal third portion of the duodenum.                           - No specimens collected. Recommendation:- Return patient to hospital ward for ongoing care.                           - Clear liquid diet today.                           - Continue present medications including                            pantoprazole 40 mg po bid.                           - Duodenitis is a potential source for blood loss.                            Recommend performing a colonoscopy tomorrow and                            will discuss with patient when he recovers from                            sedation.  Antimicrobials:  None   Subjective: No acute complains no fever or chills. No nausea or vomiting.   Objective: Vitals:   05/25/20 1159 05/25/20 1209 05/25/20 1310  05/25/20 1805  BP: (!) 139/57 (!) 172/61 (!) 169/66 (!) 188/66  Pulse: 68 62 63 68  Resp: (!) 21 14 16 16   Temp:   97.6 F (36.4 C) 97.6 F (36.4 C)  TempSrc:   Oral   SpO2: 98% 100% 99% 98%  Weight:      Height:        Intake/Output Summary (Last 24 hours) at 05/25/2020 1906 Last data filed at 05/25/2020 1645 Gross per 24 hour  Intake 2278.97 ml  Output 400 ml  Net 1878.97 ml   Filed Weights   05/23/20 1251  Weight: 122.5 kg   General: Appear in mild distress, no Rash; Oral Mucosa Clear, moist. no Abnormal Neck Mass Or lumps, Conjunctiva normal  Cardiovascular: S1 and S2 Present, no Murmur, Respiratory: good respiratory effort, Bilateral Air entry present and Clear to Auscultation, no Crackles, no wheezes Abdomen: Bowel Sound present, Soft and no tenderness Extremities: no Pedal edema, no calf tenderness Neurology: alert and oriented to time, place, and person affect appropriate. no new focal deficit Gait not checked due to patient safety concerns   Data Reviewed: I have personally reviewed following labs and imaging studies  CBC: Recent Labs  Lab 05/23/20 1955 05/24/20 0507 05/24/20 1636 05/25/20 0513 05/25/20 1308  WBC 6.0 6.0 4.4 4.5 4.4  HGB 11.3* 10.5* 8.3* 8.4* 10.2*  HCT 34.6* 31.8* 26.1* 26.6* 32.3*  MCV 89.9 88.6 91.6 91.7 92.3  PLT 138* 132* 114* 106* 419*   Basic Metabolic Panel: Recent Labs  Lab 05/23/20 1319 05/24/20 0507 05/25/20 0513 05/25/20 1308  NA 135 135 139 141  K 5.8* 5.0 4.9 5.6*  CL 105 105 113* 110  CO2 16* 17* 14* 16*  GLUCOSE 142* 140* 113*  118*  BUN 97* 111* 111* 112*  CREATININE 6.78* 6.99* 6.61* 7.10*  CALCIUM 8.4* 8.2* 7.9* 8.4*  MG  --   --   --  1.8  PHOS  --   --   --  8.9*   GFR: Estimated Creatinine Clearance: 14.8 mL/min (A) (by C-G formula based on SCr of 7.1 mg/dL (H)). Liver Function Tests: Recent Labs  Lab 05/23/20 1319 05/24/20 0507 05/25/20 0513 05/25/20 1308  AST 14* 14* 13* 11*  ALT 20 19 16 18     ALKPHOS 105 90 90 96  BILITOT 0.3 0.6 0.5 0.5  PROT 6.9 6.4* 6.6 6.6  ALBUMIN 3.5 3.0* 3.3* 3.5   No results for input(s): LIPASE, AMYLASE in the last 168 hours. No results for input(s): AMMONIA in the last 168 hours. Coagulation Profile: No results for input(s): INR, PROTIME in the last 168 hours. Cardiac Enzymes: No results for input(s): CKTOTAL, CKMB, CKMBINDEX, TROPONINI in the last 168 hours. BNP (last 3 results) No results for input(s): PROBNP in the last 8760 hours. HbA1C: Recent Labs    05/23/20 1955  HGBA1C 5.5   CBG: Recent Labs  Lab 05/25/20 0435 05/25/20 0752 05/25/20 1106 05/25/20 1313 05/25/20 1701  GLUCAP 103* 114* 103* 113* 237*   Lipid Profile: No results for input(s): CHOL, HDL, LDLCALC, TRIG, CHOLHDL, LDLDIRECT in the last 72 hours. Thyroid Function Tests: No results for input(s): TSH, T4TOTAL, FREET4, T3FREE, THYROIDAB in the last 72 hours. Anemia Panel: No results for input(s): VITAMINB12, FOLATE, FERRITIN, TIBC, IRON, RETICCTPCT in the last 72 hours. Urine analysis:    Component Value Date/Time   COLORURINE YELLOW 05/23/2020 2057   APPEARANCEUR CLEAR 05/23/2020 2057   LABSPEC 1.008 05/23/2020 2057   PHURINE 5.0 05/23/2020 2057   GLUCOSEU 50 (A) 05/23/2020 2057   HGBUR LARGE (A) 05/23/2020 2057   BILIRUBINUR NEGATIVE 05/23/2020 2057   BILIRUBINUR neg 10/01/2014 1445   KETONESUR NEGATIVE 05/23/2020 2057   PROTEINUR 100 (A) 05/23/2020 2057   UROBILINOGEN 0.2 10/01/2014 1445   UROBILINOGEN 0.2 09/30/2014 2051   NITRITE NEGATIVE 05/23/2020 2057   LEUKOCYTESUR NEGATIVE 05/23/2020 2057   Recent Results (from the past 240 hour(s))  SARS Coronavirus 2 by RT PCR (hospital order, performed in [facility] hospital lab) Nasopharyngeal Nasopharyngeal Swab     Status: None   Collection Time: 05/23/20  4:00 PM   Specimen: Nasopharyngeal Swab  Result Value Ref Range Status   SARS Coronavirus 2 NEGATIVE NEGATIVE Final    Comment: (NOTE) SARS-CoV-2  target nucleic acids are NOT DETECTED.  The SARS-CoV-2 RNA is generally detectable in upper and lower respiratory specimens during the acute phase of infection. The lowest concentration of SARS-CoV-2 viral copies this assay can detect is 250 copies / mL. A negative result does not preclude SARS-CoV-2 infection and should not be used as the sole basis for treatment or other patient management decisions.  A negative result may occur with improper specimen collection / handling, submission of specimen other than nasopharyngeal swab, presence of viral mutation(s) within the areas targeted by this assay, and inadequate number of viral copies (<250 copies / mL). A negative result must be combined with clinical observations, patient history, and epidemiological information.  Fact Sheet for Patients:   StrictlyIdeas.no  Fact Sheet for Healthcare Providers: BankingDealers.co.za  This test is not yet approved or  cleared by the [place] FDA and has been authorized for detection and/or diagnosis of SARS-CoV-2 by FDA under an Emergency Use Authorization (EUA).  This EUA  will remain in effect (meaning this test can be used) for the duration of the COVID-19 declaration under Section 564(b)(1) of the Act, 21 U.S.C. section 360bbb-3(b)(1), unless the authorization is terminated or revoked sooner.  Performed at [facility], [place] [address], [place], [place] [address]       Radiology Studies: US RENAL  Result Date: 05/23/2020 CLINICAL DATA:  Initial evaluation for acute renal insufficiency. EXAM: RENAL / URINARY TRACT ULTRASOUND COMPLETE COMPARISON:  None. FINDINGS: Right Kidney: Renal measurements: 13.1 x 6.4 x 8.7 cm = volume: 370.2 mL. Diffusely increased echogenicity within the renal parenchyma with mild diffuse cortical thinning. No nephrolithiasis or hydronephrosis. No focal renal mass. Left Kidney: Renal measurements: 13.7  x 7.1 x 7.1 cm = volume: 361.6 mL. Increased echogenicity within the renal parenchyma with mild diffuse cortical thinning. No nephrolithiasis or hydronephrosis. No focal renal mass. Bladder: Appears normal for degree of bladder distention. Other: None. IMPRESSION: 1. Diffusely increased echogenicity within the renal parenchyma with underlying mild diffuse cortical thinning, consistent with chronic medical renal disease. 2. No hydronephrosis or other significant finding. Electronically Signed   By: Jeannine Boga M.D.   On: 05/23/2020 21:30   DG CHEST PORT 1 VIEW  Result Date: 05/24/2020 CLINICAL DATA:  Atrial fibrillation. EXAM: PORTABLE CHEST 1 VIEW COMPARISON:  October 09, 2014 FINDINGS: Lungs are clear. Heart is borderline enlarged with pacemaker leads attached to right atrium and right ventricle. Pulmonary vascularity is normal. No adenopathy. No bone lesions. No pneumothorax. IMPRESSION: Heart borderline enlarged. Pacemaker leads attached to right atrium and right ventricle. Lungs clear. Electronically Signed   By: Lowella Grip III M.D.   On: 05/24/2020 10:12   ECHOCARDIOGRAM COMPLETE  Result Date: 05/24/2020    ECHOCARDIOGRAM REPORT   Patient Name:   Alexander [name] [facility] Date of Exam: 05/24/2020 Medical Rec #:  [id number]     Height:       72.0 in Accession #:    [id number]    Weight:       270.0 lb Date of Birth:  [birth year]     BSA:          2.420 m Patient Age:    61 years      BP:           190/75 mmHg Patient Gender: M             HR:           66 bpm. Exam Location:  Inpatient Procedure: 2D Echo, Color Doppler, Cardiac Doppler and Intracardiac            Opacification Agent Indications:    R60.0 Lower extremity edema  History:        Patient has prior history of Echocardiogram examinations, most                 recent 10/08/2014. CAD, Pacemaker, Arrythmias:Atrial                 Fibrillation; Risk Factors:Hypertension and Diabetes.  Sonographer:    Raquel Sarna Senior RDCS Referring Phys: [id number] [place]  1. Left ventricular ejection fraction, by estimation, is 60 to 65%. The left ventricle has normal function. The left ventricle has no regional wall motion abnormalities. There is severe concentric left ventricular hypertrophy. Left ventricular diastolic  parameters are consistent with Grade II diastolic dysfunction (pseudonormalization).  2. Right ventricular systolic function is normal. The right ventricular size is normal. There is mildly elevated pulmonary artery systolic  pressure.  3. Left atrial size was moderately dilated.  4. Right atrial size was moderately dilated.  5. The mitral valve is normal in structure. Mild mitral valve regurgitation. No evidence of mitral stenosis.  6. The aortic valve is tricuspid. Aortic valve regurgitation is not visualized. No aortic stenosis is present.  7. The inferior vena cava is normal in size with greater than 50% respiratory variability, suggesting right atrial pressure of 3 mmHg. Conclusion(s)/Recommendation(s): Normal biventricular function without evidence of hemodynamically significant valvular heart disease. FINDINGS  Left Ventricle: Left ventricular ejection fraction, by estimation, is 60 to 65%. The left ventricle has normal function. The left ventricle has no regional wall motion abnormalities. Definity contrast agent was given IV to delineate the left ventricular  endocardial borders. The left ventricular internal cavity size was normal in size. There is severe concentric left ventricular hypertrophy. Left ventricular diastolic parameters are consistent with Grade II diastolic dysfunction (pseudonormalization). Right Ventricle: The right ventricular size is normal. No increase in right ventricular wall thickness. Right ventricular systolic function is normal. There is mildly elevated pulmonary artery systolic pressure. The tricuspid regurgitant velocity is 3.03  m/s, and with an assumed right atrial pressure of 3 mmHg, the estimated right  ventricular systolic pressure is 50.3 mmHg. Left Atrium: Left atrial size was moderately dilated. Right Atrium: Right atrial size was moderately dilated. Pericardium: There is no evidence of pericardial effusion. Mitral Valve: The mitral valve is normal in structure. Mild mitral valve regurgitation. No evidence of mitral valve stenosis. Tricuspid Valve: The tricuspid valve is normal in structure. Tricuspid valve regurgitation is mild . No evidence of tricuspid stenosis. Aortic Valve: The aortic valve is tricuspid. Aortic valve regurgitation is not visualized. No aortic stenosis is present. Pulmonic Valve: The pulmonic valve was grossly normal. Pulmonic valve regurgitation is trivial. No evidence of pulmonic stenosis. Aorta: The aortic root, ascending aorta and aortic arch are all structurally normal, with no evidence of dilitation or obstruction. Venous: The inferior vena cava is normal in size with greater than 50% respiratory variability, suggesting right atrial pressure of 3 mmHg. IAS/Shunts: The atrial septum is grossly normal. Additional Comments: A pacer wire is visualized in the right atrium and right ventricle.  LEFT VENTRICLE PLAX 2D LVIDd:         4.96 cm  Diastology LVIDs:         2.63 cm  LV e' lateral:   7.83 cm/s LV PW:         1.62 cm  LV E/e' lateral: 15.7 LV IVS:        2.28 cm  LV e' medial:    6.53 cm/s LVOT diam:     2.30 cm  LV E/e' medial:  18.8 LV SV:         116 LV SV Index:   48 LVOT Area:     4.15 cm  RIGHT VENTRICLE RV S prime:     15.20 cm/s TAPSE (M-mode): 2.5 cm LEFT ATRIUM              Index       RIGHT ATRIUM           Index LA diam:        3.60 cm  1.49 cm/m  RA Area:     24.60 cm LA Vol (A2C):   142.0 ml 58.67 ml/m RA Volume:   80.60 ml  33.30 ml/m LA Vol (A4C):   98.9 ml  40.86 ml/m LA Biplane Vol: 123.0 ml 50.82  ml/m  AORTIC VALVE LVOT Vmax:   113.00 cm/s LVOT Vmean:  75.900 cm/s LVOT VTI:    0.280 m  AORTA Ao Root diam: 3.70 cm Ao Asc diam:  3.40 cm MITRAL VALVE                 TRICUSPID VALVE MV Area (PHT): 4.80 cm     TR Peak grad:   36.7 mmHg MV Decel Time: 158 msec     TR Vmax:        303.00 cm/s MV E velocity: 123.00 cm/s MV A velocity: 84.40 cm/s   SHUNTS MV E/A ratio:  1.46         Systemic VTI:  0.28 m                             Systemic Diam: 2.30 cm Buford Dresser MD Electronically signed by Buford Dresser MD Signature Date/Time: 05/24/2020/5:35:57 PM    Final     Scheduled Meds: . atorvastatin  20 mg Oral Daily  . folic acid  1 mg Oral Daily  . hydrALAZINE  100 mg Oral TID  . insulin aspart  0-15 Units Subcutaneous Q4H  . insulin glargine  5 Units Subcutaneous BID  . LORazepam  0-4 mg Intravenous Q6H   Followed by  . [START ON 05/27/2020] LORazepam  0-4 mg Intravenous Q12H  . metoprolol tartrate  100 mg Oral BID  . multivitamin with minerals  1 tablet Oral Daily  . pantoprazole (PROTONIX) IV  40 mg Intravenous Q12H  . prazosin  2 mg Oral QPC supper  . sodium zirconium cyclosilicate  10 g Oral BID  . thiamine  100 mg Oral Daily   Or  . thiamine  100 mg Intravenous Daily   Continuous Infusions: . sodium chloride 75 mL/hr at 05/25/20 1645  . methylPREDNISolone (SOLU-MEDROL) injection 500 mg (05/25/20 1856)     LOS: 2 days   Time spent: 25 minutes.  Berle Mull, MD [facility] [email or web address] 05/25/2020, 7:06 PM

## 2020-05-25 NOTE — ED Notes (Signed)
ENDO nurse at bedside to take patient for colonoscopy.

## 2020-05-25 NOTE — Transfer of Care (Signed)
Immediate Anesthesia Transfer of Care Note  Patient: Alexander [name]  Procedure(s) Performed: Procedure(s): COLONOSCOPY WITH PROPOFOL (N/A)  Patient Location: PACU and Endoscopy Unit  Anesthesia Type:MAC  Level of Consciousness: awake, alert  and oriented  Airway & Oxygen Therapy: Patient Spontanous Breathing and Patient connected to nasal cannula oxygen  Post-op Assessment: Report given to RN and Post -op Vital signs reviewed and stable  Post vital signs: Reviewed and stable  Last Vitals:  Vitals:   05/25/20 1030 05/25/20 1102  BP: (!) 160/74 (!) 187/61  Pulse: 65 61  Resp: 13 (!) 24  Temp:  36.6 C  SpO2: 59% 27%    Complications: No apparent anesthesia complications

## 2020-05-26 ENCOUNTER — Encounter (HOSPITAL_COMMUNITY): Payer: Medicare Other

## 2020-05-26 ENCOUNTER — Inpatient Hospital Stay (HOSPITAL_COMMUNITY): Payer: Medicare Other

## 2020-05-26 LAB — GLUCOSE, CAPILLARY
Glucose-Capillary: 153 mg/dL — ABNORMAL HIGH (ref 70–99)
Glucose-Capillary: 178 mg/dL — ABNORMAL HIGH (ref 70–99)
Glucose-Capillary: 178 mg/dL — ABNORMAL HIGH (ref 70–99)
Glucose-Capillary: 179 mg/dL — ABNORMAL HIGH (ref 70–99)
Glucose-Capillary: 202 mg/dL — ABNORMAL HIGH (ref 70–99)
Glucose-Capillary: 206 mg/dL — ABNORMAL HIGH (ref 70–99)

## 2020-05-26 LAB — CBC
HCT: 31.8 % — ABNORMAL LOW (ref 39.0–52.0)
Hemoglobin: 10.1 g/dL — ABNORMAL LOW (ref 13.0–17.0)
MCH: 28.6 pg (ref 26.0–34.0)
MCHC: 31.8 g/dL (ref 30.0–36.0)
MCV: 90.1 fL (ref 80.0–100.0)
Platelets: 126 10*3/uL — ABNORMAL LOW (ref 150–400)
RBC: 3.53 MIL/uL — ABNORMAL LOW (ref 4.22–5.81)
RDW: 14.5 % (ref 11.5–15.5)
WBC: 3.5 10*3/uL — ABNORMAL LOW (ref 4.0–10.5)
nRBC: 0 % (ref 0.0–0.2)

## 2020-05-26 LAB — COMPREHENSIVE METABOLIC PANEL
ALT: 23 U/L (ref 0–44)
AST: 19 U/L (ref 15–41)
Albumin: 3 g/dL — ABNORMAL LOW (ref 3.5–5.0)
Alkaline Phosphatase: 87 U/L (ref 38–126)
Anion gap: 12 (ref 5–15)
BUN: 110 mg/dL — ABNORMAL HIGH (ref 8–23)
CO2: 14 mmol/L — ABNORMAL LOW (ref 22–32)
Calcium: 8.3 mg/dL — ABNORMAL LOW (ref 8.9–10.3)
Chloride: 111 mmol/L (ref 98–111)
Creatinine, Ser: 7.01 mg/dL — ABNORMAL HIGH (ref 0.61–1.24)
GFR calc Af Amer: 9 mL/min — ABNORMAL LOW (ref >60)
GFR calc non Af Amer: 8 mL/min — ABNORMAL LOW (ref >60)
Glucose, Bld: 193 mg/dL — ABNORMAL HIGH (ref 70–99)
Potassium: 5.7 mmol/L — ABNORMAL HIGH (ref 3.5–5.1)
Sodium: 137 mmol/L (ref 135–145)
Total Bilirubin: 0.5 mg/dL (ref 0.3–1.2)
Total Protein: 6.6 g/dL (ref 6.5–8.1)

## 2020-05-26 LAB — PROTIME-INR
INR: 1.2 (ref 0.8–1.2)
Prothrombin Time: 15.1 seconds (ref 11.4–15.2)

## 2020-05-26 LAB — PROTEIN / CREATININE RATIO, URINE
Creatinine, Urine: 120.99 mg/dL
Protein Creatinine Ratio: 0.84 mg/mg{Cre} — ABNORMAL HIGH (ref 0.00–0.15)
Total Protein, Urine: 102 mg/dL

## 2020-05-26 LAB — HEPATITIS PANEL, ACUTE
HCV Ab: NONREACTIVE
Hep A IgM: NONREACTIVE
Hep B C IgM: NONREACTIVE
Hepatitis B Surface Ag: NONREACTIVE

## 2020-05-26 MED ORDER — MIDAZOLAM HCL 2 MG/2ML IJ SOLN
INTRAMUSCULAR | Status: AC | PRN
  Administered 2020-05-26 (×2): 1 mg via INTRAVENOUS

## 2020-05-26 MED ORDER — SODIUM CHLORIDE (PF) 0.9 % IJ SOLN
INTRAMUSCULAR | Status: AC
  Filled 2020-05-26: qty 20

## 2020-05-26 MED ORDER — GELATIN ABSORBABLE 12-7 MM EX MISC
CUTANEOUS | Status: AC
  Filled 2020-05-26: qty 1

## 2020-05-26 MED ORDER — MIDAZOLAM HCL 2 MG/2ML IJ SOLN
INTRAMUSCULAR | Status: AC
  Filled 2020-05-26: qty 2

## 2020-05-26 MED ORDER — FENTANYL CITRATE (PF) 100 MCG/2ML IJ SOLN
INTRAMUSCULAR | Status: AC
  Filled 2020-05-26: qty 2

## 2020-05-26 MED ORDER — FENTANYL CITRATE (PF) 100 MCG/2ML IJ SOLN
INTRAMUSCULAR | Status: AC | PRN
  Administered 2020-05-26 (×2): 50 ug via INTRAVENOUS

## 2020-05-26 MED ORDER — AMLODIPINE BESYLATE 5 MG PO TABS
5.0000 mg | ORAL_TABLET | Freq: Every day | ORAL | Status: DC
  Administered 2020-05-26 – 2020-05-30 (×5): 5 mg via ORAL
  Filled 2020-05-26 (×5): qty 1

## 2020-05-26 MED ORDER — LIDOCAINE HCL 1 % IJ SOLN
INTRAMUSCULAR | Status: AC
  Filled 2020-05-26: qty 20

## 2020-05-26 NOTE — Progress Notes (Addendum)
[facility] Progress Note  Subjective: only c/o is feeling "puffy", no sob or n/v. Awaiting renal bx.   Vitals:   05/26/20 1110 05/26/20 1115 05/26/20 1125 05/26/20 1141  BP: (!) 171/72 (!) 167/70 (!) 136/52 (!) 164/62  Pulse: 83 82 82 78  Resp: '19 20 17 18  ' Temp:    97.6 F (36.4 C)  TempSrc:    Oral  SpO2: 98% 96% 96% 97%  Weight:      Height:        Exam:  alert, nad   no jvd  Chest cta bilat  Cor reg no RG  Abd soft ntnd no ascites   Ext R bka,  1+ pretib edema,    Alert, NF, ox3    UNa 86, UCr 40   UA 7.12 - clear rare bact, >50 rbc, 0-5 wbc   Renal US - 13.1/ 13.7 cm diffusely increased echogenicity with mild diffuse cortical thinning, consistent with chronic medical renal disease. No hydronephrosis.    CXR - borderline CM, no acute disease  Assessment/ Plan: 1. AoCKD 4 - b/l creat 2.5- 3.0, eGFR 21- 24 ml/min march-April 2021. CKD likely diab nephropathy, but now creat 6-7 w/o good reason and UA is suspicious for GN. Giving IV bolus steroids 545m solumedrol/ d x 3. IR is planning for renal biopsy today. Serologies drawn today. Will dc IVF"s, follow-up creat in am.  2. GIB/ melena - Hb not real low, eliquis on hold. Sp EGD and colonscopy.  3. HTN - cont hydralazine/ metop, holding acei/ lasix.  4. CAD 5.  PAD sp BKA 02/2020 6. Afib / sp PPM - eliquis on hold 7. DM2 - insulin dependent many years     RKelly Splinter7/15/2021, 12:38 PM   Recent Labs  Lab 05/25/20 1308 05/26/20 0533  K 5.6* 5.7*  BUN 112* 110*  CREATININE 7.10* 7.01*  CALCIUM 8.4* 8.3*  PHOS 8.9*  --   HGB 10.2* 10.1*   Inpatient medications: . amLODipine  5 mg Oral Daily  . atorvastatin  20 mg Oral Daily  . folic acid  1 mg Oral Daily  . hydrALAZINE  100 mg Oral TID  . insulin aspart  0-15 Units Subcutaneous Q4H  . insulin glargine  5 Units Subcutaneous BID  . LORazepam  0-4 mg Intravenous Q6H   Followed by  . [START ON 05/27/2020] LORazepam  0-4 mg Intravenous Q12H   . metoprolol tartrate  100 mg Oral BID  . multivitamin with minerals  1 tablet Oral Daily  . pantoprazole (PROTONIX) IV  40 mg Intravenous Q12H  . prazosin  2 mg Oral QPC supper  . sodium zirconium cyclosilicate  10 g Oral BID  . thiamine  100 mg Oral Daily   Or  . thiamine  100 mg Intravenous Daily   . sodium chloride 75 mL/hr at 05/26/20 0208  . methylPREDNISolone (SOLU-MEDROL) injection 500 mg (05/25/20 1856)   acetaminophen **OR** acetaminophen, albuterol, labetalol, LORazepam **OR** LORazepam, ondansetron **OR** ondansetron (ZOFRAN) IV

## 2020-05-26 NOTE — Progress Notes (Signed)
PROGRESS NOTE  ARVO EALY  GQQ:[id number] DOB: 08/07/1961 DOA: 05/23/2020 PCP: [name], MD   Brief Narrative: Per HPI: Alexander [name] is a 59 y.o. male with medical history significant for A. fib on Eliquis,recent right TKA 4/30,CAD s/ PTCA 2015,PVD,s/p PM,HLD,CKD stage IV baseline creatinine 2.5-3 followed by Dr. Justin Mend on ASA/Lipitor/Lasix 40 mg bid/hydralazine,lisinopril,prazosin, diabetes mellitus on long-term insulin with NPH 70/30, 56  Ac BF and 46 U before supper to the ED for evaluation of black tarry stool for 2 days.Patient has had 3 bowel movements in the past 2 days,has not had any iron or Pepto-Bismol. He has had no previous history of GI bleed.  Patient is any chest pain, nausea, vomiting, abdominal pain fever or chills. Patient reports she has had EGD and colonoscopy in 2015 and was unremarkable.   Assessment & Plan: GI bleeding, duodenitis:  H&H now stable. Underwent EGD and colonoscopy. EGD showed duodenitis. Continue PPI.  AKI on stage IV CKD:  Baseline creatinine 2.5-3 due to diabetic nephropathy.  BUN elevation at least in part due to GIB. Appreciate nephrology assistance. Holding diuretic, ACE.  Continue IVF, monitor I/O. No indications for HD.  added lokelma Underwent renal biopsy. Monitor renal function.  Monitor urine output.  Mild volume overload noted.  Hyperkalemia:  Improved w/fluids, lokelma.  - Continue monitoring.    Metabolic acidosis: Normal anion gap.  Due to renal impairment.   CAD s/ PTCA 2015, PVD, HLD, s/p PPM:  No anginal complaints. With active bleeding and CAD, would use transfusion threshold of 8g/dl.  - Hold ASA, give BB, statin.  Uncontrolled hypertension/hypertension urgency:  - Continue hydralazine, prazosin ,hold lisinopril,hold lasix  - Continue prn labetalol.  PAF: Currently NSR - Continue metoprolol, holding AC as above.  PAD s/p TKA 4/30: Appears to have normal healing.  IDT2DM: HbA1c 8.6%.  - Continue  current management, CBGs at inpatient goal.   Morbid obesity: Body mass index is 36.62 kg/m.   Thrombocytopenia:  - Hold DOAC/ASA as above.   DVT prophylaxis: SCDs Code Status: Full Family Communication: None at bedside Disposition Plan:  Status is: Inpatient  Remains inpatient appropriate because:Ongoing diagnostic testing needed not appropriate for outpatient work up   Dispo: The patient is from: Home              Anticipated d/c is to: Home              Anticipated d/c date is: 2 days              Patient currently is not medically stable to d/c.  Consultants:   GI  Nephrology  Procedures:   EGD 05/24/2020:  Impression:       - Normal esophagus.                           - Normal stomach.                           - Duodenitis.                           - Normal third portion of the duodenum.                           - No specimens collected. Recommendation:- Return patient to hospital ward for ongoing care.                           -  Clear liquid diet today.                           - Continue present medications including                            pantoprazole 40 mg po bid.                           - Duodenitis is a potential source for blood loss.                            Recommend performing a colonoscopy tomorrow and                            will discuss with patient when he recovers from                            sedation.  Antimicrobials:  None   Subjective: Feeling comfortable.  No nausea no vomiting.  Seen after renal biopsy.  Low back pain reported by the patient.  Objective: Vitals:   05/26/20 1115 05/26/20 1125 05/26/20 1141 05/26/20 1619  BP: (!) 167/70 (!) 136/52 (!) 164/62 (!) 160/58  Pulse: 82 82 78 73  Resp: 20 17 18 20   Temp:   97.6 F (36.4 C) 97.9 F (36.6 C)  TempSrc:   Oral Oral  SpO2: 96% 96% 97% 96%  Weight:      Height:        Intake/Output Summary (Last 24 hours) at 05/26/2020 1854 Last data filed at 05/26/2020  1700 Gross per 24 hour  Intake 1380.65 ml  Output 610 ml  Net 770.65 ml   Filed Weights   05/23/20 1251  Weight: 122.5 kg   General: Appear in mild distress, no Rash; Oral Mucosa Clear, moist. no Abnormal Neck Mass Or lumps, Conjunctiva normal  Cardiovascular: S1 and S2 Present, no Murmur, Respiratory: good respiratory effort, Bilateral Air entry present and Clear to Auscultation, no Crackles, no wheezes Abdomen: Bowel Sound present, Soft and no tenderness Extremities: no Pedal edema, no calf tenderness Neurology: alert and oriented to time, place, and person affect appropriate. no new focal deficit Gait not checked due to patient safety concerns   Data Reviewed: I have personally reviewed following labs and imaging studies  CBC: Recent Labs  Lab 05/24/20 0507 05/24/20 1636 05/25/20 0513 05/25/20 1308 05/26/20 0533  WBC 6.0 4.4 4.5 4.4 3.5*  HGB 10.5* 8.3* 8.4* 10.2* 10.1*  HCT 31.8* 26.1* 26.6* 32.3* 31.8*  MCV 88.6 91.6 91.7 92.3 90.1  PLT 132* 114* 106* 121* 419*   Basic Metabolic Panel: Recent Labs  Lab 05/23/20 1319 05/24/20 0507 05/25/20 0513 05/25/20 1308 05/26/20 0533  NA 135 135 139 141 137  K 5.8* 5.0 4.9 5.6* 5.7*  CL 105 105 113* 110 111  CO2 16* 17* 14* 16* 14*  GLUCOSE 142* 140* 113* 118* 193*  BUN 97* 111* 111* 112* 110*  CREATININE 6.78* 6.99* 6.61* 7.10* 7.01*  CALCIUM 8.4* 8.2* 7.9* 8.4* 8.3*  MG  --   --   --  1.8  --   PHOS  --   --   --  8.9*  --    GFR: Estimated Creatinine Clearance:  15 mL/min (A) (by C-G formula based on SCr of 7.01 mg/dL (H)). Liver Function Tests: Recent Labs  Lab 05/23/20 1319 05/24/20 0507 05/25/20 0513 05/25/20 1308 05/26/20 0533  AST 14* 14* 13* 11* 19  ALT 20 19 16 18 23   ALKPHOS 105 90 90 96 87  BILITOT 0.3 0.6 0.5 0.5 0.5  PROT 6.9 6.4* 6.6 6.6 6.6  ALBUMIN 3.5 3.0* 3.3* 3.5 3.0*   No results for input(s): LIPASE, AMYLASE in the last 168 hours. No results for input(s): AMMONIA in the last 168  hours. Coagulation Profile: Recent Labs  Lab 05/26/20 0851  INR 1.2   Cardiac Enzymes: No results for input(s): CKTOTAL, CKMB, CKMBINDEX, TROPONINI in the last 168 hours. BNP (last 3 results) No results for input(s): PROBNP in the last 8760 hours. HbA1C: Recent Labs    05/23/20 1955  HGBA1C 5.5   CBG: Recent Labs  Lab 05/25/20 2345 05/26/20 0402 05/26/20 0730 05/26/20 1148 05/26/20 1615  GLUCAP 170* 178* 178* 153* 179*   Lipid Profile: No results for input(s): CHOL, HDL, LDLCALC, TRIG, CHOLHDL, LDLDIRECT in the last 72 hours. Thyroid Function Tests: No results for input(s): TSH, T4TOTAL, FREET4, T3FREE, THYROIDAB in the last 72 hours. Anemia Panel: No results for input(s): VITAMINB12, FOLATE, FERRITIN, TIBC, IRON, RETICCTPCT in the last 72 hours. Urine analysis:    Component Value Date/Time   COLORURINE YELLOW 05/23/2020 2057   APPEARANCEUR CLEAR 05/23/2020 2057   LABSPEC 1.008 05/23/2020 2057   PHURINE 5.0 05/23/2020 2057   GLUCOSEU 50 (A) 05/23/2020 2057   HGBUR LARGE (A) 05/23/2020 2057   BILIRUBINUR NEGATIVE 05/23/2020 2057   BILIRUBINUR neg 10/01/2014 1445   KETONESUR NEGATIVE 05/23/2020 2057   PROTEINUR 100 (A) 05/23/2020 2057   UROBILINOGEN 0.2 10/01/2014 1445   UROBILINOGEN 0.2 09/30/2014 2051   NITRITE NEGATIVE 05/23/2020 2057   LEUKOCYTESUR NEGATIVE 05/23/2020 2057   Recent Results (from the past 240 hour(s))  SARS Coronavirus 2 by RT PCR (hospital order, performed in [facility] hospital lab) Nasopharyngeal Nasopharyngeal Swab     Status: None   Collection Time: 05/23/20  4:00 PM   Specimen: Nasopharyngeal Swab  Result Value Ref Range Status   SARS Coronavirus 2 NEGATIVE NEGATIVE Final    Comment: (NOTE) SARS-CoV-2 target nucleic acids are NOT DETECTED.  The SARS-CoV-2 RNA is generally detectable in upper and lower respiratory specimens during the acute phase of infection. The lowest concentration of SARS-CoV-2 viral copies this assay can  detect is 250 copies / mL. A negative result does not preclude SARS-CoV-2 infection and should not be used as the sole basis for treatment or other patient management decisions.  A negative result may occur with improper specimen collection / handling, submission of specimen other than nasopharyngeal swab, presence of viral mutation(s) within the areas targeted by this assay, and inadequate number of viral copies (<250 copies / mL). A negative result must be combined with clinical observations, patient history, and epidemiological information.  Fact Sheet for Patients:   StrictlyIdeas.no  Fact Sheet for Healthcare Providers: BankingDealers.co.za  This test is not yet approved or  cleared by the [place] FDA and has been authorized for detection and/or diagnosis of SARS-CoV-2 by FDA under an Emergency Use Authorization (EUA).  This EUA will remain in effect (meaning this test can be used) for the duration of the COVID-19 declaration under Section 564(b)(1) of the Act, 21 U.S.C. section 360bbb-3(b)(1), unless the authorization is terminated or revoked sooner.  Performed at [facility],  [place] [address], [place], [place] [address]       Radiology Studies: IR KIDNEY BIOPSY  Result Date: 05/26/2020 INDICATION: Proteinuria and worsening renal function. Please perform ultrasound-guided renal biopsy for tissue diagnostic purposes. EXAM: ULTRASOUND GUIDED RENAL BIOPSY COMPARISON:  Renal ultrasound-05/23/2020 MEDICATIONS: None. ANESTHESIA/SEDATION: Fentanyl 100 mcg IV; Versed 2 mg IV Total Moderate Sedation time: 10 minutes; The patient was continuously monitored during the procedure by the interventional radiology nurse under my direct supervision. COMPLICATIONS: None immediate. PROCEDURE: Informed written consent was obtained from the patient after a discussion of the risks, benefits and alternatives to treatment. The  patient understands and consents the procedure. A timeout was performed prior to the initiation of the procedure. Ultrasound scanning was performed of the bilateral flanks. The inferior pole of the right kidney was selected for biopsy due to location and sonographic window. The procedure was planned. The operative site was prepped and draped in the usual sterile fashion. The overlying soft tissues were anesthetized with 1% lidocaine with epinephrine. A 17 gauge core needle biopsy device was advanced into the inferior cortex of the right kidney and 2 core biopsies were obtained under direct ultrasound guidance. Images were saved for documentation purposes. The biopsy device was removed and hemostasis was obtained with manual compression. Post procedural scanning was negative for significant post procedural hemorrhage or additional complication. A dressing was placed. The patient tolerated the procedure well without immediate post procedural complication. IMPRESSION: Technically successful ultrasound guided right renal biopsy. Electronically Signed   By: Sandi Mariscal M.D.   On: 05/26/2020 12:09    Scheduled Meds: . amLODipine  5 mg Oral Daily  . atorvastatin  20 mg Oral Daily  . folic acid  1 mg Oral Daily  . hydrALAZINE  100 mg Oral TID  . insulin aspart  0-15 Units Subcutaneous Q4H  . insulin glargine  5 Units Subcutaneous BID  . LORazepam  0-4 mg Intravenous Q6H   Followed by  . [START ON 05/27/2020] LORazepam  0-4 mg Intravenous Q12H  . metoprolol tartrate  100 mg Oral BID  . multivitamin with minerals  1 tablet Oral Daily  . pantoprazole (PROTONIX) IV  40 mg Intravenous Q12H  . prazosin  2 mg Oral QPC supper  . sodium zirconium cyclosilicate  10 g Oral BID  . thiamine  100 mg Oral Daily   Or  . thiamine  100 mg Intravenous Daily   Continuous Infusions: . methylPREDNISolone (SOLU-MEDROL) injection 500 mg (05/25/20 1856)     LOS: 3 days   Time spent: 25 minutes.  Berle Mull, MD [facility] [email or web address] 05/26/2020, 6:54 PM

## 2020-05-26 NOTE — TOC Initial Note (Signed)
Transition of Care [facility]) - Initial/Assessment Note    Patient Details  Name: Alexander [name] MRN: [id number] Date of Birth: [birth year]  [facility] CM/SW Contact:    Dessa Phi, RN Phone Number: 05/26/2020, 12:58 PM  Clinical Narrative: CM referral for substance counseling-patient declines any assistance-states I don't smoke, & I can control my drinking. No further CM needs.                  Expected Discharge Plan: Home/Self Care Barriers to Discharge: Continued Medical Work up   Patient Goals and CMS Choice Patient states their goals for this hospitalization and ongoing recovery are:: go home CMS Medicare.gov Compare Post Acute Care list provided to:: Patient Choice offered to / list presented to : Patient  Expected Discharge Plan and Services Expected Discharge Plan: Home/Self Care   Discharge Planning Services: CM Consult   Living arrangements for the past 2 months: Single Family Home                                      Prior Living Arrangements/Services Living arrangements for the past 2 months: Single Family Home Lives with:: Spouse   Do you feel safe going back to the place where you live?: Yes      Need for Family Participation in Patient Care: No (Comment) Care giver support system in place?: Yes (comment)   Criminal Activity/Legal Involvement Pertinent to Current Situation/Hospitalization: No - Comment as needed  Activities of Daily Living Home Assistive Devices/Equipment: Wheelchair, Eyeglasses, CBG Meter ADL Screening (condition at time of admission) Patient's cognitive ability adequate to safely complete daily activities?: Yes Is the patient deaf or have difficulty hearing?: No Does the patient have difficulty seeing, even when wearing glasses/contacts?: No Does the patient have difficulty concentrating, remembering, or making decisions?: No Patient able to express need for assistance with ADLs?: Yes Does the patient have  difficulty dressing or bathing?: No Independently performs ADLs?: No Communication: Independent Dressing (OT): Independent Grooming: Independent Feeding: Independent Bathing: Independent Toileting: Needs assistance (patient waiting for stump to heal-using a wheelchair) Is this a change from baseline?: Pre-admission baseline In/Out Bed: Needs assistance Is this a change from baseline?: Pre-admission baseline Walks in Home: Dependent Is this a change from baseline?: Pre-admission baseline Does the patient have difficulty walking or climbing stairs?: Yes Weakness of Legs: Left (right aka) Weakness of Arms/Hands: None  Permission Sought/Granted Permission sought to share information with : Case Manager Permission granted to share information with : Yes, Verbal Permission Granted              Emotional Assessment              Admission diagnosis:  GI bleeding [K92.2] Acute GI bleeding [K92.2] Lower GI bleed [K92.2] AKI (acute kidney injury) ([place]) [N17.9] Acute renal failure, unspecified acute renal failure type ([place]) [N17.9] Patient Active Problem List   Diagnosis Date Noted  . Occult blood in stools   . Melena   . GI bleeding 05/23/2020  . Acute GI bleeding 05/23/2020  . Acute hematogenous osteomyelitis of right foot ([place]) 03/11/2020  . Gangrene of right foot ([place])   . Diabetic foot ulcer associated with type 2 diabetes mellitus ([place]) 02/19/2020  . Heel ulcer ([place]) 11/05/2019  . Atherosclerotic PVD with ulceration ([place]) 09/09/2017  . Essential hypertension, benign 02/07/2015  . Renal insufficiency 02/07/2015  . Post PTCA 11/09/2014  .  S/P PTCA (percutaneous transluminal coronary angioplasty) 11/09/2014  . Pacemaker: [place] DR MRI [id number] Dual chamber pacemaker 10/08/2014 10/08/2014  . Encounter for care of pacemaker 10/08/2014  . Sinus node dysfunction ([place]) 10/08/2014  . Cardiac asystole ([place]) 10/07/2014  . Cardiac syncope 10/07/2014  . Diabetes  mellitus with stage 3 chronic kidney disease ([place]) 10/07/2014  . CAD (coronary artery disease), native coronary artery 10/07/2014  . Diabetes mellitus due to underlying condition without complications ([place]) 78/93/8101  . Anemia, unspecified 05/25/2014  . Syncope 04/30/2014  . Atherosclerosis of native arteries of the extremities with gangrene ([place]) 12/16/2013  . Volume overload 10/30/2013  . Acute renal failure ([place]) 10/28/2013  . PAD (peripheral artery disease) ([place]) 10/27/2013  . Leukocytosis 10/21/2013  . Anemia 10/21/2013  . Osteomyelitis of left foot ([place]) 08/19/2013  . Spinal stenosis, lumbar region, with neurogenic claudication 06/12/2012    Class: Diagnosis of  . DIABETES MELLITUS, TYPE II 03/15/2009  . ALCOHOL ABUSE 03/15/2009  . TOBACCO USE 03/15/2009  . HYPERTENSION 03/15/2009   PCP:  [name], MD Pharmacy:   [facility] [address] [place] [address] Phone: [phone] Fax: [phone]     Social Determinants of Health (SDOH) Interventions    Readmission Risk Interventions Readmission Risk Prevention Plan 03/15/2020  Transportation Screening Complete  PCP or Specialist Appt within 5-7 Days Complete  Home Care Screening Complete  Medication Review (RN CM) Complete  Some recent data might be hidden

## 2020-05-26 NOTE — Consult Note (Addendum)
Chief Complaint: Patient was seen in consultation today for AKI on CKD/random renal biopsy.  Referring Physician(s): Roney Jaffe  Supervising Physician: Sandi Mariscal  Patient Status: [facility] - In-pt  History of Present Illness: Alexander [name] is a 59 y.o. male with a past medical history of hypertension, hyperlipidemia, atrial fibrillation on chronic anticoagulation with Eliquis, CAD, sinus node dysfunction s/p pacemaker, PVD, CKD stage IV, diabetes mellitus, anemia, osteomyelitis s/p right BKA 03/2020, arthritis cataracts, and alcohol abuse. He presented to [facility] ED 05/23/2020 secondary to dark stools. In ED, stool was heme positive. GI was consulted and he was admitted for further management. He underwent EGD 05/24/2020 (revealing duodenitis) and colonoscopy 05/25/2020 (normal exam). Due to CKD with worsening renal function on admission, nephrology was also consulted who recommended IR consultation for possible random renal biopsy to determine cause of AKI.  IR requested by Dr. Jonnie Finner for possible image-guided random renal biopsy. Patient awake and alert laying in bed watching TV with no complaints at this time. Denies fever, chills, chest pain, dyspnea, abdominal pain, or headache.  LD Eliquis prior to admission- 05/23/2020.   Past Medical History:  Diagnosis Date  . Alcohol abuse   . Anemia   . Arthritis    "patient does not think so."  . Atrial fibrillation ([place])   . Cardiac syncope 10/07/14   rapid A fib with 8 sec pauses on converison with syncope- temp pacing wire placed then PPM  . Cataract    BILATERAL  . Chronic kidney disease   . Coronary artery disease   . Diabetes mellitus    dx---been  awhile  . Encounter for care of pacemaker 10/08/2014  . History of blood transfusion   . Hyperlipemia   . Hypertension   . Osteomyelitis ([place])    right foot  . Peripheral vascular disease ([place])   . Presence of permanent cardiac pacemaker 10/08/2014  . Sinus node dysfunction ([place])  10/08/2014    Past Surgical History:  Procedure Laterality Date  . ABDOMINAL AORTOGRAM W/LOWER EXTREMITY N/A 09/09/2017   Procedure: ABDOMINAL AORTOGRAM W/LOWER EXTREMITY;  Surgeon: Angelia Mould, MD;  Location: [place] CV LAB;  Service: Cardiovascular;  Laterality: N/A;  . ABDOMINAL AORTOGRAM W/LOWER EXTREMITY Right 11/11/2019   Procedure: ABDOMINAL AORTOGRAM W/LOWER EXTREMITY;  Surgeon: Marty Heck, MD;  Location: [place] CV LAB;  Service: Cardiovascular;  Laterality: Right;  . AMPUTATION Left 08/19/2013   Procedure: AMPUTATION RAY;  Surgeon: Alta Corning, MD;  Location: [place];  Service: Orthopedics;  Laterality: Left;  ray amputation left 5th  . AMPUTATION Left 10/27/2013   Procedure: AMPUTATION DIGIT-LEFT 4TH TOE, 4th and 5th metatarsal.;  Surgeon: Angelia Mould, MD;  Location: [place];  Service: Vascular;  Laterality: Left;  . AMPUTATION Left 11/04/2013   Procedure: LEFT FOOT TRANS-METATARSAL AMPUTATION WITH WOUND CLOSURE ;  Surgeon: Alta Corning, MD;  Location: [place];  Service: Orthopedics;  Laterality: Left;  . AMPUTATION Right 09/10/2017   Procedure: RIGHT FOURTH AND FIFTH TOE AMPUTATION;  Surgeon: Angelia Mould, MD;  Location: [place];  Service: Vascular;  Laterality: Right;  . AMPUTATION Right 02/19/2020   Procedure: AMPUTATION RIGHT 3RD TOE;  Surgeon: Angelia Mould, MD;  Location: [place];  Service: Vascular;  Laterality: Right;  . AMPUTATION Right 03/11/2020   Procedure: RIGHT BELOW KNEE AMPUTATION;  Surgeon: Newt Minion, MD;  Location: [place];  Service: Orthopedics;  Laterality: Right;  . APPLICATION OF WOUND VAC Right 02/19/2020   Procedure:  Application Of Wound Vac Right foot;  Surgeon: Angelia Mould, MD;  Location: [place];  Service: Vascular;  Laterality: Right;  . CARDIAC CATHETERIZATION  10/07/2014   Procedure: LEFT HEART CATH AND CORONARY ANGIOGRAPHY;  Surgeon: Laverda Page, MD;  Location: [facility] CATH LAB;  Service:  Cardiovascular;;  . COLONOSCOPY W/ POLYPECTOMY    . EMBOLECTOMY Right 09/12/2017   Procedure: Thrombectomy  & Redo Right Below Knee Popliteal Artery Bypass Graft  ;  Surgeon: Waynetta Sandy, MD;  Location: [place];  Service: Vascular;  Laterality: Right;  . EYE SURGERY Bilateral    cataract  . FEMORAL-TIBIAL BYPASS GRAFT Left 10/27/2013   Procedure: BYPASS GRAFT LEFT FEMORAL- POSTERIOR TIBIAL ARTERY;  Surgeon: Angelia Mould, MD;  Location: [place];  Service: Vascular;  Laterality: Left;  . FEMORAL-TIBIAL BYPASS GRAFT Right 09/10/2017   Procedure: RIGHT SUPERFICIAL  FEMORAL ARTERY-BELOW KNEE POPLITEAL ARTERY BYPASS GRAFT WITH VEIN;  Surgeon: Angelia Mould, MD;  Location: [place];  Service: Vascular;  Laterality: Right;  . I & D EXTREMITY Left 10/27/2013   Procedure: IRRIGATION AND DEBRIDEMENT EXTREMITY- LEFT FOOT;  Surgeon: Angelia Mould, MD;  Location: [place];  Service: Vascular;  Laterality: Left;  . I & D EXTREMITY Right 01/26/2020   Procedure: IRRIGATION AND DEBRIDEMENT EXTREMITY RIGHT FOOT WOUND;  Surgeon: Angelia Mould, MD;  Location: [place];  Service: Vascular;  Laterality: Right;  . INTRAOPERATIVE ARTERIOGRAM Right 09/10/2017   Procedure: INTRA OPERATIVE ARTERIOGRAM;  Surgeon: Angelia Mould, MD;  Location: [place];  Service: Vascular;  Laterality: Right;  . IR ANGIOGRAM FOLLOW UP STUDY  09/09/2017  . LEFT HEART CATHETERIZATION WITH CORONARY ANGIOGRAM N/A 11/09/2014   Procedure: LEFT HEART CATHETERIZATION WITH CORONARY ANGIOGRAM;  Surgeon: Laverda Page, MD;  Location: [facility] CATH LAB;  Service: Cardiovascular;  Laterality: N/A;  . LOWER EXTREMITY ANGIOGRAM Right 11/08/2015   Procedure: Lower Extremity Angiogram;  Surgeon: Serafina Mitchell, MD;  Location: [place] CV LAB;  Service: Cardiovascular;  Laterality: Right;  . LUMBAR LAMINECTOMY  06/13/2012   Procedure: MICRODISCECTOMY LUMBAR LAMINECTOMY;  Surgeon: Jessy Oto, MD;  Location: [place];   Service: Orthopedics;  Laterality: N/A;  Central laminectomy L2-3, L3-4, L4-5  . PACEMAKER INSERTION  10/08/14   MDT Advisa MRI compatible dual chamber pacemaker implanted by Dr Caryl Comes for syncope with post-termination pauses  . PERCUTANEOUS CORONARY STENT INTERVENTION (PCI-S)  11/09/2014   des to lad & distal circumflex         Dr  [name]  . PERIPHERAL VASCULAR BALLOON ANGIOPLASTY Right 11/11/2019   Procedure: PERIPHERAL VASCULAR BALLOON ANGIOPLASTY;  Surgeon: Marty Heck, MD;  Location: [place] CV LAB;  Service: Cardiovascular;  Laterality: Right;  below knee popliteal, tibioperoneal trunk, posterior tibial arteries  . PERIPHERAL VASCULAR CATHETERIZATION N/A 11/08/2015   Procedure: Abdominal Aortogram;  Surgeon: Serafina Mitchell, MD;  Location: [place] CV LAB;  Service: Cardiovascular;  Laterality: N/A;  . PERIPHERAL VASCULAR CATHETERIZATION Right 11/08/2015   Procedure: Peripheral Vascular Atherectomy;  Surgeon: Serafina Mitchell, MD;  Location: [place] CV LAB;  Service: Cardiovascular;  Laterality: Right;  . PERIPHERAL VASCULAR CATHETERIZATION N/A 10/10/2016   Procedure: Lower Extremity Angiography;  Surgeon: Waynetta Sandy, MD;  Location: [place] CV LAB;  Service: Cardiovascular;  Laterality: N/A;  . PERIPHERAL VASCULAR CATHETERIZATION Right 10/10/2016   Procedure: Peripheral Vascular Atherectomy;  Surgeon: Waynetta Sandy, MD;  Location: [place] CV LAB;  Service: Cardiovascular;  Laterality: Right;  Popliteal  . PERMANENT PACEMAKER INSERTION N/A 10/08/2014   Procedure: PERMANENT PACEMAKER INSERTION;  Surgeon: Deboraha Sprang, MD;  Location: [facility] CATH LAB;  Service: Cardiovascular;  Laterality: N/A;  . TEMPORARY PACEMAKER INSERTION Bilateral 10/07/2014   Procedure: TEMPORARY PACEMAKER INSERTION;  Surgeon: Laverda Page, MD;  Location: [facility] CATH LAB;  Service: Cardiovascular;  Laterality: Bilateral;  . WOUND DEBRIDEMENT Right 02/19/2020   Procedure:  DEBRIDEMENT WOUND RIGHT FOOT;  Surgeon: Angelia Mould, MD;  Location: [place];  Service: Vascular;  Laterality: Right;    Allergies: Keflex [cephalexin], Pletal [cilostazol], and Sulfa antibiotics  Medications: Prior to Admission medications   Medication Sig Start Date End Date Taking? Authorizing Provider  apixaban (ELIQUIS) 5 MG TABS tablet Take 1 tablet (5 mg total) by mouth 2 (two) times daily. DO NOT TAKE THIS MEDICATION UNTIL WE RESTART IT AFTER YOUR SURGERY. 02/16/20  Yes Rhyne, Hulen Shouts, PA-C  aspirin 81 MG EC tablet Take 81 mg by mouth daily.    Yes [provider]  atorvastatin (LIPITOR) 20 MG tablet Take 1 tablet by mouth once daily Patient taking differently: Take 20 mg by mouth daily.  12/24/19  Yes Miquel Dunn, NP  furosemide (LASIX) 40 MG tablet Take 40 mg by mouth 2 (two) times daily with a meal.    Yes [provider]  hydrALAZINE (APRESOLINE) 100 MG tablet TAKE 1 TABLET BY MOUTH THREE TIMES DAILY Patient taking differently: Take 100 mg by mouth 2 (two) times daily.  02/24/20  Yes Adrian Prows, MD  insulin NPH-regular Human (70-30) 100 UNIT/ML injection Inject 46-56 Units into the skin daily with breakfast.   Yes [provider]  lisinopril (ZESTRIL) 10 MG tablet Take 1 tablet (10 mg total) by mouth daily. 02/16/20  Yes Rhyne, Samantha J, PA-C  metoprolol tartrate (LOPRESSOR) 100 MG tablet Take 1 tablet by mouth twice daily 05/13/20  Yes Tolia, Sunit, DO  prazosin (MINIPRESS) 2 MG capsule TAKE 1 CAPSULE BY MOUTH IN THE EVENING AFTER SUPPER Patient taking differently: Take 2 mg by mouth daily after supper.  02/24/20  Yes Adrian Prows, MD  ACCU-CHEK FASTCLIX LANCETS MISC Check blood sugar TID & QHS 10/01/14   Lorayne Marek, MD  blood glucose meter kit and supplies KIT Check blood sugar TID & QHS 02/10/15   Advani, Vernon Prey, MD  Blood Glucose Monitoring Suppl (ACCU-CHEK ADVANTAGE DIABETES) kit Use as instructed 10/01/14   Lorayne Marek, MD    glucose blood (ACCU-CHEK AVIVA PLUS) test strip Use as instructed 10/01/14   Advani, Vernon Prey, MD  glucose blood (CHOICE DM FORA G20 TEST STRIPS) test strip Check blood sugar TID & QHS 02/10/15   Advani, Vernon Prey, MD  glucose blood test strip Use as instructed 11/20/13   Reyne Dumas, MD     Family History  Problem Relation Age of Onset  . Diabetes type II Mother   . Hypertension Mother   . Diabetes Mother   . Liver cancer Father   . Diabetes type II Sister   . Breast cancer Sister   . Diabetes Sister   . Hypertension Sister   . Diabetes type II Brother   . Kidney failure Brother   . Diabetes Brother   . Hypertension Brother   . Diabetes type II Sister     Social History   Socioeconomic History  . Marital status: Married    Spouse name: Not on file  . Number of children: 2  . Years of education: Not on file  .  Highest education level: Not on file  Occupational History  . Occupation: Disabled  Tobacco Use  . Smoking status: Former Smoker    Packs/day: 1.50    Years: 30.00    Pack years: 45.00    Types: Cigars    Quit date: 10/07/2014    Years since quitting: 5.6  . Smokeless tobacco: Never Used  Vaping Use  . Vaping Use: Never used  Substance and Sexual Activity  . Alcohol use: Yes    Alcohol/week: 6.0 standard drinks    Types: 6 Cans of beer per week    Comment: 2-3 beers daily  . Drug use: No  . Sexual activity: Not on file  Other Topics Concern  . Not on file  Social History Narrative  . Not on file   Social Determinants of Health   Financial Resource Strain:   . Difficulty of Paying Living Expenses:   Food Insecurity:   . Worried About Charity fundraiser in the Last Year:   . Arboriculturist in the Last Year:   Transportation Needs:   . Film/video editor (Medical):   Marland Kitchen Lack of Transportation (Non-Medical):   Physical Activity:   . Days of Exercise per Week:   . Minutes of Exercise per Session:   Stress:   . Feeling of Stress :   Social  Connections:   . Frequency of Communication with Friends and Family:   . Frequency of Social Gatherings with Friends and Family:   . Attends Religious Services:   . Active Member of Clubs or Organizations:   . Attends Archivist Meetings:   Marland Kitchen Marital Status:      Review of Systems: A 12 point ROS discussed and pertinent positives are indicated in the HPI above.  All other systems are negative.  Review of Systems  Constitutional: Negative for chills and fever.  Respiratory: Negative for shortness of breath and wheezing.   Cardiovascular: Negative for chest pain and palpitations.  Gastrointestinal: Negative for abdominal pain.  Neurological: Negative for headaches.  Psychiatric/Behavioral: Negative for behavioral problems and confusion.    Vital Signs: BP (!) 173/71 (BP Location: Left Arm)   Pulse 72   Temp 98.3 F (36.8 C) (Oral)   Resp 16   Ht 6' (1.829 m)   Wt 270 lb (122.5 kg)   SpO2 97%   BMI 36.62 kg/m   Physical Exam Vitals and nursing note reviewed.  Constitutional:      General: He is not in acute distress.    Appearance: Normal appearance.  Cardiovascular:     Heart sounds: No murmur heard.      Comments: Irregular rate, irregular rhythm. Pulmonary:     Effort: Pulmonary effort is normal. No respiratory distress.     Breath sounds: Normal breath sounds. No wheezing.  Neurological:     Mental Status: He is alert.      MD Evaluation Airway: WNL Heart: WNL Abdomen: WNL Chest/ Lungs: WNL ASA  Classification: 3 Mallampati/Airway Score: Two   Imaging: US RENAL  Result Date: 05/23/2020 CLINICAL DATA:  Initial evaluation for acute renal insufficiency. EXAM: RENAL / URINARY TRACT ULTRASOUND COMPLETE COMPARISON:  None. FINDINGS: Right Kidney: Renal measurements: 13.1 x 6.4 x 8.7 cm = volume: 370.2 mL. Diffusely increased echogenicity within the renal parenchyma with mild diffuse cortical thinning. No nephrolithiasis or hydronephrosis. No focal  renal mass. Left Kidney: Renal measurements: 13.7 x 7.1 x 7.1 cm = volume: 361.6 mL. Increased echogenicity within the  renal parenchyma with mild diffuse cortical thinning. No nephrolithiasis or hydronephrosis. No focal renal mass. Bladder: Appears normal for degree of bladder distention. Other: None. IMPRESSION: 1. Diffusely increased echogenicity within the renal parenchyma with underlying mild diffuse cortical thinning, consistent with chronic medical renal disease. 2. No hydronephrosis or other significant finding. Electronically Signed   By: Jeannine Boga M.D.   On: 05/23/2020 21:30   DG CHEST PORT 1 VIEW  Result Date: 05/24/2020 CLINICAL DATA:  Atrial fibrillation. EXAM: PORTABLE CHEST 1 VIEW COMPARISON:  October 09, 2014 FINDINGS: Lungs are clear. Heart is borderline enlarged with pacemaker leads attached to right atrium and right ventricle. Pulmonary vascularity is normal. No adenopathy. No bone lesions. No pneumothorax. IMPRESSION: Heart borderline enlarged. Pacemaker leads attached to right atrium and right ventricle. Lungs clear. Electronically Signed   By: Lowella Grip III M.D.   On: 05/24/2020 10:12   ECHOCARDIOGRAM COMPLETE  Result Date: 05/24/2020    ECHOCARDIOGRAM REPORT   Patient Name:   Alexander [name] [facility] Date of Exam: 05/24/2020 Medical Rec #:  [id number]     Height:       72.0 in Accession #:    [id number]    Weight:       270.0 lb Date of Birth:  Mar 11, 1961     BSA:          2.420 m Patient Age:    35 years      BP:           190/75 mmHg Patient Gender: M             HR:           66 bpm. Exam Location:  Inpatient Procedure: 2D Echo, Color Doppler, Cardiac Doppler and Intracardiac            Opacification Agent Indications:    R60.0 Lower extremity edema  History:        Patient has prior history of Echocardiogram examinations, most                 recent 10/08/2014. CAD, Pacemaker, Arrythmias:Atrial                 Fibrillation; Risk Factors:Hypertension and Diabetes.   Sonographer:    Raquel Sarna Senior RDCS Referring Phys: [id number] [place]  1. Left ventricular ejection fraction, by estimation, is 60 to 65%. The left ventricle has normal function. The left ventricle has no regional wall motion abnormalities. There is severe concentric left ventricular hypertrophy. Left ventricular diastolic  parameters are consistent with Grade II diastolic dysfunction (pseudonormalization).  2. Right ventricular systolic function is normal. The right ventricular size is normal. There is mildly elevated pulmonary artery systolic pressure.  3. Left atrial size was moderately dilated.  4. Right atrial size was moderately dilated.  5. The mitral valve is normal in structure. Mild mitral valve regurgitation. No evidence of mitral stenosis.  6. The aortic valve is tricuspid. Aortic valve regurgitation is not visualized. No aortic stenosis is present.  7. The inferior vena cava is normal in size with greater than 50% respiratory variability, suggesting right atrial pressure of 3 mmHg. Conclusion(s)/Recommendation(s): Normal biventricular function without evidence of hemodynamically significant valvular heart disease. FINDINGS  Left Ventricle: Left ventricular ejection fraction, by estimation, is 60 to 65%. The left ventricle has normal function. The left ventricle has no regional wall motion abnormalities. Definity contrast agent was given IV to delineate the left ventricular  endocardial borders. The left ventricular internal cavity  size was normal in size. There is severe concentric left ventricular hypertrophy. Left ventricular diastolic parameters are consistent with Grade II diastolic dysfunction (pseudonormalization). Right Ventricle: The right ventricular size is normal. No increase in right ventricular wall thickness. Right ventricular systolic function is normal. There is mildly elevated pulmonary artery systolic pressure. The tricuspid regurgitant velocity is 3.03  m/s, and with an  assumed right atrial pressure of 3 mmHg, the estimated right ventricular systolic pressure is 65.7 mmHg. Left Atrium: Left atrial size was moderately dilated. Right Atrium: Right atrial size was moderately dilated. Pericardium: There is no evidence of pericardial effusion. Mitral Valve: The mitral valve is normal in structure. Mild mitral valve regurgitation. No evidence of mitral valve stenosis. Tricuspid Valve: The tricuspid valve is normal in structure. Tricuspid valve regurgitation is mild . No evidence of tricuspid stenosis. Aortic Valve: The aortic valve is tricuspid. Aortic valve regurgitation is not visualized. No aortic stenosis is present. Pulmonic Valve: The pulmonic valve was grossly normal. Pulmonic valve regurgitation is trivial. No evidence of pulmonic stenosis. Aorta: The aortic root, ascending aorta and aortic arch are all structurally normal, with no evidence of dilitation or obstruction. Venous: The inferior vena cava is normal in size with greater than 50% respiratory variability, suggesting right atrial pressure of 3 mmHg. IAS/Shunts: The atrial septum is grossly normal. Additional Comments: A pacer wire is visualized in the right atrium and right ventricle.  LEFT VENTRICLE PLAX 2D LVIDd:         4.96 cm  Diastology LVIDs:         2.63 cm  LV e' lateral:   7.83 cm/s LV PW:         1.62 cm  LV E/e' lateral: 15.7 LV IVS:        2.28 cm  LV e' medial:    6.53 cm/s LVOT diam:     2.30 cm  LV E/e' medial:  18.8 LV SV:         116 LV SV Index:   48 LVOT Area:     4.15 cm  RIGHT VENTRICLE RV S prime:     15.20 cm/s TAPSE (M-mode): 2.5 cm LEFT ATRIUM              Index       RIGHT ATRIUM           Index LA diam:        3.60 cm  1.49 cm/m  RA Area:     24.60 cm LA Vol (A2C):   142.0 ml 58.67 ml/m RA Volume:   80.60 ml  33.30 ml/m LA Vol (A4C):   98.9 ml  40.86 ml/m LA Biplane Vol: 123.0 ml 50.82 ml/m  AORTIC VALVE LVOT Vmax:   113.00 cm/s LVOT Vmean:  75.900 cm/s LVOT VTI:    0.280 m  AORTA Ao  Root diam: 3.70 cm Ao Asc diam:  3.40 cm MITRAL VALVE                TRICUSPID VALVE MV Area (PHT): 4.80 cm     TR Peak grad:   36.7 mmHg MV Decel Time: 158 msec     TR Vmax:        303.00 cm/s MV E velocity: 123.00 cm/s MV A velocity: 84.40 cm/s   SHUNTS MV E/A ratio:  1.46         Systemic VTI:  0.28 m  Systemic Diam: 2.30 cm Buford Dresser MD Electronically signed by Buford Dresser MD Signature Date/Time: 05/24/2020/5:35:57 PM    Final     Labs:  CBC: Recent Labs    05/24/20 1636 05/25/20 0513 05/25/20 1308 05/26/20 0533  WBC 4.4 4.5 4.4 3.5*  HGB 8.3* 8.4* 10.2* 10.1*  HCT 26.1* 26.6* 32.3* 31.8*  PLT 114* 106* 121* 126*    COAGS: Recent Labs    01/26/20 0811 03/11/20 1112  INR 1.2 1.7*    BMP: Recent Labs    05/24/20 0507 05/25/20 0513 05/25/20 1308 05/26/20 0533  NA 135 139 141 137  K 5.0 4.9 5.6* 5.7*  CL 105 113* 110 111  CO2 17* 14* 16* 14*  GLUCOSE 140* 113* 118* 193*  BUN 111* 111* 112* 110*  CALCIUM 8.2* 7.9* 8.4* 8.3*  CREATININE 6.99* 6.61* 7.10* 7.01*  GFRNONAA 8* 8* 8* 8*  GFRAA 9* 10* 9* 9*    LIVER FUNCTION TESTS: Recent Labs    05/24/20 0507 05/25/20 0513 05/25/20 1308 05/26/20 0533  BILITOT 0.6 0.5 0.5 0.5  AST 14* 13* 11* 19  ALT '19 16 18 23  ' ALKPHOS 90 90 96 87  PROT 6.4* 6.6 6.6 6.6  ALBUMIN 3.0* 3.3* 3.5 3.0*     Assessment and Plan:  AKI on CKD with concerns for glomerulonephritis. Plan for image-guided random renal biopsy tentatively for today in IR. Patient is NPO. Afebrile. Eliquis held per IR protocol. INR 1.2 today.  Risks and benefits discussed with the patient including, but not limited to bleeding, infection, damage to adjacent structures or low yield requiring additional tests. All of the patient's questions were answered, patient is agreeable to proceed. Consent signed and in chart.   Thank you for this interesting consult.  I greatly enjoyed meeting TYLYN DERWIN and  look forward to participating in their care.  A copy of this report was sent to the requesting provider on this date.  Electronically Signed: Earley Abide, PA-C 05/26/2020, 9:10 AM   I spent a total of 40 Minutes in face to face in clinical consultation, greater than 50% of which was counseling/coordinating care for AKI on CKD/random renal biopsy.

## 2020-05-26 NOTE — Care Management Important Message (Signed)
Important Message  Patient Details IM Letter given to Dessa Phi RN Case Manager to present to the Patient Name: Alexander [name] MRN: [id number] Date of Birth: 1961/05/27   Medicare Important Message Given:  Yes     Kerin Salen 05/26/2020, 12:15 PM

## 2020-05-26 NOTE — Procedures (Signed)
Pre Procedure Dx: Proteinuria; worsening renal function Post Procedural Dx: Same  Technically successful US guided biopsy of inferior pole of the right kidney.   EBL: None  No immediate complications.   Ronny Bacon, MD Pager #: [phone]

## 2020-05-26 NOTE — Progress Notes (Signed)
Assumed care of this patient at 1500.  I agree with the previous nurses assessment.   

## 2020-05-26 NOTE — Sedation Documentation (Signed)
Dr. Pascal Lux at bedside ultrasounding patients kidneys prior to procedure.

## 2020-05-26 NOTE — Progress Notes (Signed)
PT Cancellation Note  Patient Details Name: Alexander [name] MRN: [id number] DOB: 23-Oct-1961   Cancelled Treatment:    Reason Eval/Treat Not Completed: Medical issues which prohibited therapy-going for renal biapsy. Will check back another time.    Claretha Cooper 05/26/2020, 10:55 AM [place] Pager [phone] Office [phone]

## 2020-05-27 ENCOUNTER — Encounter (HOSPITAL_COMMUNITY): Payer: Self-pay | Admitting: Gastroenterology

## 2020-05-27 LAB — RENAL FUNCTION PANEL
Albumin: 3.4 g/dL — ABNORMAL LOW (ref 3.5–5.0)
Anion gap: 15 (ref 5–15)
BUN: 121 mg/dL — ABNORMAL HIGH (ref 8–23)
CO2: 11 mmol/L — ABNORMAL LOW (ref 22–32)
Calcium: 8.4 mg/dL — ABNORMAL LOW (ref 8.9–10.3)
Chloride: 109 mmol/L (ref 98–111)
Creatinine, Ser: 8.23 mg/dL — ABNORMAL HIGH (ref 0.61–1.24)
GFR calc Af Amer: 7 mL/min — ABNORMAL LOW (ref >60)
GFR calc non Af Amer: 6 mL/min — ABNORMAL LOW (ref >60)
Glucose, Bld: 246 mg/dL — ABNORMAL HIGH (ref 70–99)
Phosphorus: 9.3 mg/dL — ABNORMAL HIGH (ref 2.5–4.6)
Potassium: 5.2 mmol/L — ABNORMAL HIGH (ref 3.5–5.1)
Sodium: 135 mmol/L (ref 135–145)

## 2020-05-27 LAB — PROTEIN ELECTROPHORESIS, SERUM
A/G Ratio: 1.1 (ref 0.7–1.7)
Albumin ELP: 3.3 g/dL (ref 2.9–4.4)
Alpha-1-Globulin: 0.2 g/dL (ref 0.0–0.4)
Alpha-2-Globulin: 0.6 g/dL (ref 0.4–1.0)
Beta Globulin: 0.7 g/dL (ref 0.7–1.3)
Gamma Globulin: 1.5 g/dL (ref 0.4–1.8)
Globulin, Total: 3.1 g/dL (ref 2.2–3.9)
Total Protein ELP: 6.4 g/dL (ref 6.0–8.5)

## 2020-05-27 LAB — CBC
HCT: 32.7 % — ABNORMAL LOW (ref 39.0–52.0)
Hemoglobin: 10.3 g/dL — ABNORMAL LOW (ref 13.0–17.0)
MCH: 28.7 pg (ref 26.0–34.0)
MCHC: 31.5 g/dL (ref 30.0–36.0)
MCV: 91.1 fL (ref 80.0–100.0)
Platelets: 151 10*3/uL (ref 150–400)
RBC: 3.59 MIL/uL — ABNORMAL LOW (ref 4.22–5.81)
RDW: 14.5 % (ref 11.5–15.5)
WBC: 5.5 10*3/uL (ref 4.0–10.5)
nRBC: 0 % (ref 0.0–0.2)

## 2020-05-27 LAB — GLUCOSE, CAPILLARY
Glucose-Capillary: 224 mg/dL — ABNORMAL HIGH (ref 70–99)
Glucose-Capillary: 225 mg/dL — ABNORMAL HIGH (ref 70–99)
Glucose-Capillary: 250 mg/dL — ABNORMAL HIGH (ref 70–99)
Glucose-Capillary: 245 mg/dL — ABNORMAL HIGH (ref 70–99)
Glucose-Capillary: 240 mg/dL — ABNORMAL HIGH (ref 70–99)

## 2020-05-27 LAB — ANTISTREPTOLYSIN O TITER: ASO: 25 IU/mL (ref 0.0–200.0)

## 2020-05-27 LAB — C4 COMPLEMENT: Complement C4, Body Fluid: 17 mg/dL (ref 12–38)

## 2020-05-27 LAB — C3 COMPLEMENT: C3 Complement: 91 mg/dL (ref 82–167)

## 2020-05-27 MED ORDER — PREDNISONE 50 MG PO TABS
60.0000 mg | ORAL_TABLET | Freq: Every day | ORAL | Status: DC
  Administered 2020-05-28 – 2020-06-05 (×9): 60 mg via ORAL
  Filled 2020-05-27 (×2): qty 3
  Filled 2020-05-27 (×4): qty 1
  Filled 2020-05-27: qty 3
  Filled 2020-05-27 (×2): qty 1

## 2020-05-27 MED ORDER — STERILE WATER FOR INJECTION IV SOLN
INTRAVENOUS | Status: AC
  Filled 2020-05-27: qty 150

## 2020-05-27 MED ORDER — SODIUM BICARBONATE 650 MG PO TABS
650.0000 mg | ORAL_TABLET | Freq: Three times a day (TID) | ORAL | Status: DC
  Administered 2020-05-27 – 2020-05-31 (×13): 650 mg via ORAL
  Filled 2020-05-27 (×13): qty 1

## 2020-05-27 MED ORDER — SODIUM CHLORIDE 0.9 % IV SOLN
510.0000 mg | Freq: Once | INTRAVENOUS | Status: AC
  Administered 2020-05-27: 510 mg via INTRAVENOUS
  Filled 2020-05-27: qty 510

## 2020-05-27 NOTE — Progress Notes (Signed)
Inpatient Diabetes Program Recommendations  AACE/ADA: New Consensus Statement on Inpatient Glycemic Control (2015)  Target Ranges:  Prepandial:   less than 140 mg/dL      Peak postprandial:   less than 180 mg/dL (1-2 hours)      Critically ill patients:  140 - 180 mg/dL   Lab Results  Component Value Date   GLUCAP 250 (H) 05/27/2020   HGBA1C 5.5 05/23/2020  Results for BLADYN, TIPPS (MRN [id number]) as of 05/27/2020 13:20  Ref. Range 05/26/2020 19:55 05/26/2020 23:34 05/27/2020 04:32 05/27/2020 07:54 05/27/2020 11:44  Glucose-Capillary Latest Ref Range: 70 - 99 mg/dL 202 (H) 206 (H) 224 (H) 225 (H) 250 (H)    Review of Glycemic Control  Diabetes history: type 2 Outpatient Diabetes medications: NPH/Regular insulin 70/30 46-56 units at breakfast Current orders for Inpatient glycemic control: Lantus 5 units BID, Novolog MODERATE correction scale every 4 hours  Inpatient Diabetes Program Recommendations:   Noted that patient's CBGs have been greater than 200 mg/dl.   Recommend increasing Lantus to 8 units BID (weight based: 122.5 kg X 0.15 unit/kg=18.375 units) and change Novolog MODERATE correction scale to TID & HS scale if eating. Titrate dosages as needed.   Harvel Ricks RN BSN CDE Diabetes Coordinator Pager: [phone]  8am-5pm

## 2020-05-27 NOTE — Progress Notes (Signed)
PT Cancellation Note  Patient Details Name: Alexander [name] MRN: [id number] DOB: 06/20/1961   Cancelled Treatment:    Reason Eval/Treat Not Completed: PT screened, no needs identified, will sign off, Patient declined to mobilize, reports  He feels he is at his baseline. Has DME, reports wrapping residual limb with guaze and aces and the shrinkers.    Claretha Cooper 05/27/2020, 8:57 AM [place] Pager [phone] Office [phone]

## 2020-05-27 NOTE — Progress Notes (Signed)
Patient ID: Alexander [name], male   DOB: Sep 13, 1961, 60 y.o.   MRN: [id number] [name] Progress Note   Assessment/ Plan:   1. Acute kidney Injury on chronic kidney disease stage IV: Baseline creatinine ranging 2.5-3.0 and admitted with near doubling of creatinine with consequent renal biopsy done because of high suspicion of GN based on hematuria and proteinuria on urinalysis.  He is currently on empiric Solu-Medrol and has received 500 mg daily x #3 doses (including today).  Will begin oral prednisone starting tomorrow as we await serologies and pathology report from renal biopsy before deciding on additional management.  Unfortunately, both BUN and creatinine continue to rise with worsening metabolic acidosis and mild hyperkalemia-he is on Lokelma 10 g twice daily and I suspect the etiology of the BUN/creatinine disconnect may be in part from GI bleed but also from corticosteroid use.  I will begin oral sodium bicarbonate and give 1 L of isotonic sodium bicarbonate. 2.  GI bleed/melena: Suspected to be secondary to duodenitis based on EGD/colonoscopy and currently on PPI.  He is hemodynamically stable and without indications for PRBC transfusion. 3.  Hypertension: Blood pressures marginally elevated on hydralazine/prazosin and metoprolol.  Lisinopril and furosemide currently on hold. 4.  Peripheral vascular disease status post right BKA with intact dressing-malodorous. 5.  Type 2 diabetes mellitus: Insulin-dependent and likely the predominating etiology of his underlying chronic kidney disease.  Subjective:   Reports to be feeling fair-denies any chest pain or shortness of breath. Has urinated twice since renal biopsy yesterday.   Objective:   BP (!) 154/67 (BP Location: Left Arm)   Pulse 67   Temp 99.7 F (37.6 C) (Oral)   Resp 19   Ht 6' (1.829 m)   Wt 122.5 kg   SpO2 97%   BMI 36.62 kg/m   Intake/Output Summary (Last 24 hours) at 05/27/2020 1214 Last data filed at  05/27/2020 1149 Gross per 24 hour  Intake 354 ml  Output 300 ml  Net 54 ml   Weight change:   Physical Exam: Gen: Comfortably resting in bed, watching television CVS: Pulse regular rhythm, normal rate, S1 and S2 normal Resp: Clear to auscultation, no rales/rhonchi Abd: Soft, obese, nontender Ext: Trace left leg/ankle edema. Right leg status post BKA with intact dressing/shrinker  Imaging: IR KIDNEY BIOPSY  Result Date: 05/26/2020 INDICATION: Proteinuria and worsening renal function. Please perform ultrasound-guided renal biopsy for tissue diagnostic purposes. EXAM: ULTRASOUND GUIDED RENAL BIOPSY COMPARISON:  Renal ultrasound-05/23/2020 MEDICATIONS: None. ANESTHESIA/SEDATION: Fentanyl 100 mcg IV; Versed 2 mg IV Total Moderate Sedation time: 10 minutes; The patient was continuously monitored during the procedure by the interventional radiology nurse under my direct supervision. COMPLICATIONS: None immediate. PROCEDURE: Informed written consent was obtained from the patient after a discussion of the risks, benefits and alternatives to treatment. The patient understands and consents the procedure. A timeout was performed prior to the initiation of the procedure. Ultrasound scanning was performed of the bilateral flanks. The inferior pole of the right kidney was selected for biopsy due to location and sonographic window. The procedure was planned. The operative site was prepped and draped in the usual sterile fashion. The overlying soft tissues were anesthetized with 1% lidocaine with epinephrine. A 17 gauge core needle biopsy device was advanced into the inferior cortex of the right kidney and 2 core biopsies were obtained under direct ultrasound guidance. Images were saved for documentation purposes. The biopsy device was removed and hemostasis was obtained with manual compression. Post procedural  scanning was negative for significant post procedural hemorrhage or additional complication. A dressing  was placed. The patient tolerated the procedure well without immediate post procedural complication. IMPRESSION: Technically successful ultrasound guided right renal biopsy. Electronically Signed   By: Sandi Mariscal M.D.   On: 05/26/2020 12:09    Labs: BMET Recent Labs  Lab 05/23/20 1319 05/24/20 0507 05/25/20 0513 05/25/20 1308 05/26/20 0533 05/27/20 0529  NA 135 135 139 141 137 135  K 5.8* 5.0 4.9 5.6* 5.7* 5.2*  CL 105 105 113* 110 111 109  CO2 16* 17* 14* 16* 14* 11*  GLUCOSE 142* 140* 113* 118* 193* 246*  BUN 97* 111* 111* 112* 110* 121*  CREATININE 6.78* 6.99* 6.61* 7.10* 7.01* 8.23*  CALCIUM 8.4* 8.2* 7.9* 8.4* 8.3* 8.4*  PHOS  --   --   --  8.9*  --  9.3*   CBC Recent Labs  Lab 05/25/20 0513 05/25/20 1308 05/26/20 0533 05/27/20 0529  WBC 4.5 4.4 3.5* 5.5  HGB 8.4* 10.2* 10.1* 10.3*  HCT 26.6* 32.3* 31.8* 32.7*  MCV 91.7 92.3 90.1 91.1  PLT 106* 121* 126* 151    Medications:    . amLODipine  5 mg Oral Daily  . atorvastatin  20 mg Oral Daily  . folic acid  1 mg Oral Daily  . hydrALAZINE  100 mg Oral TID  . insulin aspart  0-15 Units Subcutaneous Q4H  . insulin glargine  5 Units Subcutaneous BID  . LORazepam  0-4 mg Intravenous Q12H  . metoprolol tartrate  100 mg Oral BID  . multivitamin with minerals  1 tablet Oral Daily  . pantoprazole (PROTONIX) IV  40 mg Intravenous Q12H  . prazosin  2 mg Oral QPC supper  . sodium zirconium cyclosilicate  10 g Oral BID  . thiamine  100 mg Oral Daily   Or  . thiamine  100 mg Intravenous Daily   Elmarie Shiley, MD 05/27/2020, 12:14 PM

## 2020-05-27 NOTE — Progress Notes (Signed)
PROGRESS NOTE  Alexander [name]  ZOX:[id number] DOB: January 18, 1961 DOA: 05/23/2020 PCP: [name], MD   Brief Narrative: Per HPI: Alexander [name] is a 59 y.o. male with medical history significant for A. fib on Eliquis,recent right TKA 4/30,CAD s/ PTCA 2015,PVD,s/p PM,HLD,CKD stage IV baseline creatinine 2.5-3 followed by Dr. Justin Mend on ASA/Lipitor/Lasix 40 mg bid/hydralazine,lisinopril,prazosin, diabetes mellitus on long-term insulin with NPH 70/30, 56  Ac BF and 46 U before supper to the ED for evaluation of black tarry stool for 2 days.Patient has had 3 bowel movements in the past 2 days,has not had any iron or Pepto-Bismol. He has had no previous history of GI bleed.  Patient is any chest pain, nausea, vomiting, abdominal pain fever or chills. Patient reports she has had EGD and colonoscopy in 2015 and was unremarkable.   Assessment & Plan: GI bleeding, duodenitis:  H&H now stable. Underwent EGD and colonoscopy. EGD showed duodenitis. Continue PPI.  AKI on stage IV CKD:  Baseline creatinine 2.5-3 due to diabetic nephropathy.  BUN elevation at least in part due to GIB. Appreciate nephrology assistance. Holding diuretic, ACE.  Continue IVF, monitor I/O. No indications for HD.  added lokelma Patient was also started on high-dose IV steroids.  Now steroids are tapering off. Started on bicarb. Underwent renal biopsy. Monitor renal function.  Monitor urine output.  Mild volume overload noted.  Hyperkalemia:  Improved w/fluids, lokelma.  - Continue monitoring.    Metabolic acidosis: Normal anion gap.  Due to renal impairment.   CAD s/ PTCA 2015, PVD, HLD, s/p PPM:  No anginal complaints. With active bleeding and CAD, would use transfusion threshold of 8g/dl.  - Hold ASA, give BB, statin.  Uncontrolled hypertension/hypertension urgency:  - Continue hydralazine, prazosin ,hold lisinopril,hold lasix  - Continue prn labetalol.  PAF: Currently NSR - Continue metoprolol, holding AC as  above.  PAD s/p TKA 4/30: Appears to have normal healing.  IDT2DM: HbA1c 8.6%.  - Continue current management, CBG elevated although currently holding off on further modification of medication due to change in steroid dose.  Morbid obesity: Body mass index is 36.62 kg/m.   Thrombocytopenia:  - Hold DOAC/ASA as above.   DVT prophylaxis: SCDs Code Status: Full Family Communication: None at bedside Disposition Plan:  Status is: Inpatient  Remains inpatient appropriate because:Ongoing diagnostic testing needed not appropriate for outpatient work up   Dispo: The patient is from: Home              Anticipated d/c is to: Home              Anticipated d/c date is: 2 days              Patient currently is not medically stable to d/c.  Consultants:   GI  Nephrology  Procedures:   EGD 05/24/2020:  Impression:       - Normal esophagus.                           - Normal stomach.                           - Duodenitis.                           - Normal third portion of the duodenum.                           -  No specimens collected. Recommendation:- Return patient to hospital ward for ongoing care.                           - Clear liquid diet today.                           - Continue present medications including                            pantoprazole 40 mg po bid.                           - Duodenitis is a potential source for blood loss.                            Recommend performing a colonoscopy tomorrow and                            will discuss with patient when he recovers from                            sedation.  Antimicrobials:  None   Subjective: No nausea no vomiting.  No back pain.  Continues to have swelling in the leg.  Objective: Vitals:   05/26/20 2101 05/27/20 0440 05/27/20 1148 05/27/20 1326  BP: (!) 156/66 (!) 158/68 (!) 154/67 (!) 160/65  Pulse: 70 70 67 69  Resp: 18 18 19 13   Temp: 98.4 F (36.9 C) 98.1 F (36.7 C) 99.7 F (37.6 C)  97.9 F (36.6 C)  TempSrc:   Oral   SpO2: 96% 99% 97% 98%  Weight:      Height:        Intake/Output Summary (Last 24 hours) at 05/27/2020 1932 Last data filed at 05/27/2020 1632 Gross per 24 hour  Intake 1291.46 ml  Output 0 ml  Net 1291.46 ml   Filed Weights   05/23/20 1251  Weight: 122.5 kg   General: Appear in mild distress, no Rash; Oral Mucosa Clear, moist. no Abnormal Neck Mass Or lumps, Conjunctiva normal  Cardiovascular: S1 and S2 Present, no Murmur, Respiratory: good respiratory effort, Bilateral Air entry present and Clear to Auscultation, no Crackles, no wheezes Abdomen: Bowel Sound present, Soft and no tenderness Extremities: Bilateral pedal edema, no calf tenderness Neurology: alert and oriented to time, place, and person affect appropriate. no new focal deficit Gait not checked due to patient safety concerns   Data Reviewed: I have personally reviewed following labs and imaging studies  CBC: Recent Labs  Lab 05/24/20 1636 05/25/20 0513 05/25/20 1308 05/26/20 0533 05/27/20 0529  WBC 4.4 4.5 4.4 3.5* 5.5  HGB 8.3* 8.4* 10.2* 10.1* 10.3*  HCT 26.1* 26.6* 32.3* 31.8* 32.7*  MCV 91.6 91.7 92.3 90.1 91.1  PLT 114* 106* 121* 126* 035   Basic Metabolic Panel: Recent Labs  Lab 05/24/20 0507 05/25/20 0513 05/25/20 1308 05/26/20 0533 05/27/20 0529  NA 135 139 141 137 135  K 5.0 4.9 5.6* 5.7* 5.2*  CL 105 113* 110 111 109  CO2 17* 14* 16* 14* 11*  GLUCOSE 140* 113* 118* 193* 246*  BUN 111* 111* 112* 110* 121*  CREATININE 6.99* 6.61* 7.10* 7.01* 8.23*  CALCIUM  8.2* 7.9* 8.4* 8.3* 8.4*  MG  --   --  1.8  --   --   PHOS  --   --  8.9*  --  9.3*   GFR: Estimated Creatinine Clearance: 12.7 mL/min (A) (by C-G formula based on SCr of 8.23 mg/dL (H)). Liver Function Tests: Recent Labs  Lab 05/23/20 1319 05/23/20 1319 05/24/20 0507 05/25/20 0513 05/25/20 1308 05/26/20 0533 05/27/20 0529  AST 14*  --  14* 13* 11* 19  --   ALT 20  --  19 16 18 23   --    ALKPHOS 105  --  90 90 96 87  --   BILITOT 0.3  --  0.6 0.5 0.5 0.5  --   PROT 6.9  --  6.4* 6.6 6.6 6.6  --   ALBUMIN 3.5   < > 3.0* 3.3* 3.5 3.0* 3.4*   < > = values in this interval not displayed.   No results for input(s): LIPASE, AMYLASE in the last 168 hours. No results for input(s): AMMONIA in the last 168 hours. Coagulation Profile: Recent Labs  Lab 05/26/20 0851  INR 1.2   Cardiac Enzymes: No results for input(s): CKTOTAL, CKMB, CKMBINDEX, TROPONINI in the last 168 hours. BNP (last 3 results) No results for input(s): PROBNP in the last 8760 hours. HbA1C: No results for input(s): HGBA1C in the last 72 hours. CBG: Recent Labs  Lab 05/26/20 2334 05/27/20 0432 05/27/20 0754 05/27/20 1144 05/27/20 1631  GLUCAP 206* 224* 225* 250* 245*   Lipid Profile: No results for input(s): CHOL, HDL, LDLCALC, TRIG, CHOLHDL, LDLDIRECT in the last 72 hours. Thyroid Function Tests: No results for input(s): TSH, T4TOTAL, FREET4, T3FREE, THYROIDAB in the last 72 hours. Anemia Panel: No results for input(s): VITAMINB12, FOLATE, FERRITIN, TIBC, IRON, RETICCTPCT in the last 72 hours. Urine analysis:    Component Value Date/Time   COLORURINE YELLOW 05/23/2020 2057   APPEARANCEUR CLEAR 05/23/2020 2057   LABSPEC 1.008 05/23/2020 2057   PHURINE 5.0 05/23/2020 2057   GLUCOSEU 50 (A) 05/23/2020 2057   HGBUR LARGE (A) 05/23/2020 2057   BILIRUBINUR NEGATIVE 05/23/2020 2057   BILIRUBINUR neg 10/01/2014 1445   KETONESUR NEGATIVE 05/23/2020 2057   PROTEINUR 100 (A) 05/23/2020 2057   UROBILINOGEN 0.2 10/01/2014 1445   UROBILINOGEN 0.2 09/30/2014 2051   NITRITE NEGATIVE 05/23/2020 2057   LEUKOCYTESUR NEGATIVE 05/23/2020 2057   Recent Results (from the past 240 hour(s))  SARS Coronavirus 2 by RT PCR (hospital order, performed in [facility] hospital lab) Nasopharyngeal Nasopharyngeal Swab     Status: None   Collection Time: 05/23/20  4:00 PM   Specimen: Nasopharyngeal Swab  Result Value  Ref Range Status   SARS Coronavirus 2 NEGATIVE NEGATIVE Final    Comment: (NOTE) SARS-CoV-2 target nucleic acids are NOT DETECTED.  The SARS-CoV-2 RNA is generally detectable in upper and lower respiratory specimens during the acute phase of infection. The lowest concentration of SARS-CoV-2 viral copies this assay can detect is 250 copies / mL. A negative result does not preclude SARS-CoV-2 infection and should not be used as the sole basis for treatment or other patient management decisions.  A negative result may occur with improper specimen collection / handling, submission of specimen other than nasopharyngeal swab, presence of viral mutation(s) within the areas targeted by this assay, and inadequate number of viral copies (<250 copies / mL). A negative result must be combined with clinical observations, patient history, and epidemiological information.  Fact Sheet for Patients:  StrictlyIdeas.no  Fact Sheet for Healthcare Providers: BankingDealers.co.za  This test is not yet approved or  cleared by the [place] FDA and has been authorized for detection and/or diagnosis of SARS-CoV-2 by FDA under an Emergency Use Authorization (EUA).  This EUA will remain in effect (meaning this test can be used) for the duration of the COVID-19 declaration under Section 564(b)(1) of the Act, 21 U.S.C. section 360bbb-3(b)(1), unless the authorization is terminated or revoked sooner.  Performed at [facility], [place] [address] [place], [place] [address]       Radiology Studies: IR KIDNEY BIOPSY  Result Date: 05/26/2020 INDICATION: Proteinuria and worsening renal function. Please perform ultrasound-guided renal biopsy for tissue diagnostic purposes. EXAM: ULTRASOUND GUIDED RENAL BIOPSY COMPARISON:  Renal ultrasound-05/23/2020 MEDICATIONS: None. ANESTHESIA/SEDATION: Fentanyl 100 mcg IV; Versed 2 mg IV Total Moderate  Sedation time: 10 minutes; The patient was continuously monitored during the procedure by the interventional radiology nurse under my direct supervision. COMPLICATIONS: None immediate. PROCEDURE: Informed written consent was obtained from the patient after a discussion of the risks, benefits and alternatives to treatment. The patient understands and consents the procedure. A timeout was performed prior to the initiation of the procedure. Ultrasound scanning was performed of the bilateral flanks. The inferior pole of the right kidney was selected for biopsy due to location and sonographic window. The procedure was planned. The operative site was prepped and draped in the usual sterile fashion. The overlying soft tissues were anesthetized with 1% lidocaine with epinephrine. A 17 gauge core needle biopsy device was advanced into the inferior cortex of the right kidney and 2 core biopsies were obtained under direct ultrasound guidance. Images were saved for documentation purposes. The biopsy device was removed and hemostasis was obtained with manual compression. Post procedural scanning was negative for significant post procedural hemorrhage or additional complication. A dressing was placed. The patient tolerated the procedure well without immediate post procedural complication. IMPRESSION: Technically successful ultrasound guided right renal biopsy. Electronically Signed   By: Sandi Mariscal M.D.   On: 05/26/2020 12:09    Scheduled Meds: . amLODipine  5 mg Oral Daily  . atorvastatin  20 mg Oral Daily  . folic acid  1 mg Oral Daily  . hydrALAZINE  100 mg Oral TID  . insulin aspart  0-15 Units Subcutaneous Q4H  . insulin glargine  5 Units Subcutaneous BID  . LORazepam  0-4 mg Intravenous Q12H  . metoprolol tartrate  100 mg Oral BID  . multivitamin with minerals  1 tablet Oral Daily  . pantoprazole (PROTONIX) IV  40 mg Intravenous Q12H  . prazosin  2 mg Oral QPC supper  . [START ON 05/28/2020] predniSONE  60 mg  Oral Q breakfast  . sodium bicarbonate  650 mg Oral TID  . sodium zirconium cyclosilicate  10 g Oral BID  . thiamine  100 mg Oral Daily   Or  . thiamine  100 mg Intravenous Daily   Continuous Infusions: .  sodium bicarbonate (isotonic) infusion in sterile water 100 mL/hr at 05/27/20 1433     LOS: 4 days   Time spent: 25 minutes.  Berle Mull, MD [facility] [email or web address] 05/27/2020, 7:32 PM

## 2020-05-28 LAB — GLUCOSE, CAPILLARY
Glucose-Capillary: 214 mg/dL — ABNORMAL HIGH (ref 70–99)
Glucose-Capillary: 228 mg/dL — ABNORMAL HIGH (ref 70–99)
Glucose-Capillary: 236 mg/dL — ABNORMAL HIGH (ref 70–99)
Glucose-Capillary: 248 mg/dL — ABNORMAL HIGH (ref 70–99)
Glucose-Capillary: 260 mg/dL — ABNORMAL HIGH (ref 70–99)
Glucose-Capillary: 298 mg/dL — ABNORMAL HIGH (ref 70–99)
Glucose-Capillary: 298 mg/dL — ABNORMAL HIGH (ref 70–99)

## 2020-05-28 LAB — RENAL FUNCTION PANEL
Albumin: 3.1 g/dL — ABNORMAL LOW (ref 3.5–5.0)
Anion gap: 15 (ref 5–15)
BUN: 137 mg/dL — ABNORMAL HIGH (ref 8–23)
CO2: 14 mmol/L — ABNORMAL LOW (ref 22–32)
Calcium: 8.2 mg/dL — ABNORMAL LOW (ref 8.9–10.3)
Chloride: 104 mmol/L (ref 98–111)
Creatinine, Ser: 8.23 mg/dL — ABNORMAL HIGH (ref 0.61–1.24)
GFR calc Af Amer: 7 mL/min — ABNORMAL LOW (ref >60)
GFR calc non Af Amer: 6 mL/min — ABNORMAL LOW (ref >60)
Glucose, Bld: 257 mg/dL — ABNORMAL HIGH (ref 70–99)
Phosphorus: 9.1 mg/dL — ABNORMAL HIGH (ref 2.5–4.6)
Potassium: 4.9 mmol/L (ref 3.5–5.1)
Sodium: 133 mmol/L — ABNORMAL LOW (ref 135–145)

## 2020-05-28 LAB — CBC
HCT: 29.7 % — ABNORMAL LOW (ref 39.0–52.0)
Hemoglobin: 9.9 g/dL — ABNORMAL LOW (ref 13.0–17.0)
MCH: 29.2 pg (ref 26.0–34.0)
MCHC: 33.3 g/dL (ref 30.0–36.0)
MCV: 87.6 fL (ref 80.0–100.0)
Platelets: 165 10*3/uL (ref 150–400)
RBC: 3.39 MIL/uL — ABNORMAL LOW (ref 4.22–5.81)
RDW: 14.5 % (ref 11.5–15.5)
WBC: 5.6 10*3/uL (ref 4.0–10.5)
nRBC: 0 % (ref 0.0–0.2)

## 2020-05-28 MED ORDER — STERILE WATER FOR INJECTION IV SOLN
INTRAVENOUS | Status: AC
  Filled 2020-05-28: qty 150

## 2020-05-28 MED ORDER — INSULIN GLARGINE 100 UNIT/ML ~~LOC~~ SOLN
8.0000 [IU] | Freq: Two times a day (BID) | SUBCUTANEOUS | Status: DC
  Administered 2020-05-28 – 2020-06-01 (×8): 8 [IU] via SUBCUTANEOUS
  Filled 2020-05-28 (×11): qty 0.08

## 2020-05-28 NOTE — Progress Notes (Signed)
Patient ID: Alexander [name], male   DOB: 04-14-1961, 59 y.o.   MRN: [id number] [facility] Progress Note   Assessment/ Plan:   1. Acute kidney Injury on chronic kidney disease stage IV: Baseline creatinine ranging 2.5-3.0 and admitted with near doubling of creatinine with consequent renal biopsy done because of high suspicion of GN based on hematuria and proteinuria on urinalysis.  Transition from Solu-Medrol to oral prednisone starting today.  Renal biopsy reports pending and labs reviewed; unremarkable ASO titer and complement levels within normal limit.  No evidence of plasma cell dyscrasia.  Renal function essentially unchanged overnight with rising BUN likely from corticosteroid.  Nonoliguric and with repeat 1 L isotonic sodium bicarbonate today. 2.  GI bleed/melena: Suspected to be secondary to duodenitis based on EGD/colonoscopy and currently on PPI.  He is hemodynamically stable and without indications for PRBC transfusion. 3.  Hypertension: Blood pressures marginally elevated on hydralazine/prazosin and metoprolol.  Lisinopril and furosemide currently on hold. 4.  Peripheral vascular disease status post right BKA with intact dressing-malodorous. 5.  Type 2 diabetes mellitus: Insulin-dependent and likely the predominating etiology of his underlying chronic kidney disease.  Subjective:   Denies any complaints including chest pain, shortness of breath, dysgeusia or abnormal limb jerking movements.   Objective:   BP (!) 161/68 (BP Location: Left Arm)   Pulse 66   Temp 97.8 F (36.6 C) (Oral)   Resp 20   Ht 6' (1.829 m)   Wt 120.5 kg   SpO2 96%   BMI 36.04 kg/m   Intake/Output Summary (Last 24 hours) at 05/28/2020 1128 Last data filed at 05/28/2020 1000 Gross per 24 hour  Intake 2288.21 ml  Output 875 ml  Net 1413.21 ml   Weight change:   Physical Exam: Gen: Comfortably resting in bed, watching television CVS: Pulse regular rhythm, normal rate, S1 and S2  normal Resp: Clear to auscultation, no rales/rhonchi Abd: Soft, obese, nontender Ext: Trace left leg/ankle edema. Right leg status post BKA with intact dressing/shrinker  Imaging: IR KIDNEY BIOPSY  Result Date: 05/26/2020 INDICATION: Proteinuria and worsening renal function. Please perform ultrasound-guided renal biopsy for tissue diagnostic purposes. EXAM: ULTRASOUND GUIDED RENAL BIOPSY COMPARISON:  Renal ultrasound-05/23/2020 MEDICATIONS: None. ANESTHESIA/SEDATION: Fentanyl 100 mcg IV; Versed 2 mg IV Total Moderate Sedation time: 10 minutes; The patient was continuously monitored during the procedure by the interventional radiology nurse under my direct supervision. COMPLICATIONS: None immediate. PROCEDURE: Informed written consent was obtained from the patient after a discussion of the risks, benefits and alternatives to treatment. The patient understands and consents the procedure. A timeout was performed prior to the initiation of the procedure. Ultrasound scanning was performed of the bilateral flanks. The inferior pole of the right kidney was selected for biopsy due to location and sonographic window. The procedure was planned. The operative site was prepped and draped in the usual sterile fashion. The overlying soft tissues were anesthetized with 1% lidocaine with epinephrine. A 17 gauge core needle biopsy device was advanced into the inferior cortex of the right kidney and 2 core biopsies were obtained under direct ultrasound guidance. Images were saved for documentation purposes. The biopsy device was removed and hemostasis was obtained with manual compression. Post procedural scanning was negative for significant post procedural hemorrhage or additional complication. A dressing was placed. The patient tolerated the procedure well without immediate post procedural complication. IMPRESSION: Technically successful ultrasound guided right renal biopsy. Electronically Signed   By: Sandi Mariscal M.D.    On:  05/26/2020 12:09    Labs: BMET Recent Labs  Lab 05/23/20 1319 05/24/20 0507 05/25/20 0513 05/25/20 1308 05/26/20 0533 05/27/20 0529 05/28/20 0607  NA 135 135 139 141 137 135 133*  K 5.8* 5.0 4.9 5.6* 5.7* 5.2* 4.9  CL 105 105 113* 110 111 109 104  CO2 16* 17* 14* 16* 14* 11* 14*  GLUCOSE 142* 140* 113* 118* 193* 246* 257*  BUN 97* 111* 111* 112* 110* 121* 137*  CREATININE 6.78* 6.99* 6.61* 7.10* 7.01* 8.23* 8.23*  CALCIUM 8.4* 8.2* 7.9* 8.4* 8.3* 8.4* 8.2*  PHOS  --   --   --  8.9*  --  9.3* 9.1*   CBC Recent Labs  Lab 05/25/20 1308 05/26/20 0533 05/27/20 0529 05/28/20 0607  WBC 4.4 3.5* 5.5 5.6  HGB 10.2* 10.1* 10.3* 9.9*  HCT 32.3* 31.8* 32.7* 29.7*  MCV 92.3 90.1 91.1 87.6  PLT 121* 126* 151 165    Medications:    . amLODipine  5 mg Oral Daily  . atorvastatin  20 mg Oral Daily  . folic acid  1 mg Oral Daily  . hydrALAZINE  100 mg Oral TID  . insulin aspart  0-15 Units Subcutaneous Q4H  . insulin glargine  5 Units Subcutaneous BID  . LORazepam  0-4 mg Intravenous Q12H  . metoprolol tartrate  100 mg Oral BID  . multivitamin with minerals  1 tablet Oral Daily  . pantoprazole (PROTONIX) IV  40 mg Intravenous Q12H  . prazosin  2 mg Oral QPC supper  . predniSONE  60 mg Oral Q breakfast  . sodium bicarbonate  650 mg Oral TID  . sodium zirconium cyclosilicate  10 g Oral BID  . thiamine  100 mg Oral Daily   Or  . thiamine  100 mg Intravenous Daily   Elmarie Shiley, MD 05/28/2020, 11:28 AM

## 2020-05-28 NOTE — Progress Notes (Signed)
Resumed care for this pt at 0300. I agree with previous RN's assessment. Pt resting in bed at this time.

## 2020-05-28 NOTE — Progress Notes (Signed)
PROGRESS NOTE  DEWAUN KINZLER  OZH:[id number] DOB: 30-Dec-1960 DOA: 05/23/2020 PCP: [name], MD   Brief Narrative: Per HPI: TRASK VOSLER is a 59 y.o. male with medical history significant for A. fib on Eliquis,recent right TKA 4/30,CAD s/ PTCA 2015,PVD,s/p PM,HLD,CKD stage IV baseline creatinine 2.5-3 followed by Dr. Justin Mend on ASA/Lipitor/Lasix 40 mg bid/hydralazine,lisinopril,prazosin, diabetes mellitus on long-term insulin with NPH 70/30, 56  Ac BF and 46 U before supper to the ED for evaluation of black tarry stool for 2 days.Patient has had 3 bowel movements in the past 2 days,has not had any iron or Pepto-Bismol. He has had no previous history of GI bleed.  Patient is any chest pain, nausea, vomiting, abdominal pain fever or chills. Patient reports she has had EGD and colonoscopy in 2015 and was unremarkable.   Assessment & Plan: GI bleeding, duodenitis:  H&H now stable. Underwent EGD and colonoscopy. EGD showed duodenitis. Continue PPI.  AKI on stage IV CKD:  Baseline creatinine 2.5-3 due to diabetic nephropathy.  BUN elevation at least in part due to GIB. Appreciate nephrology assistance. Holding diuretic, ACE.  Continue IVF, monitor I/O. No indications for HD.  Added lokelma Patient was also started on high-dose IV steroids.  Now steroids are tapering off. Started on bicarb. Underwent renal biopsy. Monitor renal function.  Monitor urine output. Stabilizing now.   Hyperkalemia:  Improved w/fluids, lokelma.  Continue monitoring.    Metabolic acidosis: Normal anion gap.  Due to renal impairment.   CAD s/ PTCA 2015, PVD, HLD, s/p PPM:  No anginal complaints. With active bleeding and CAD, would use transfusion threshold of 8g/dl.  Hold ASA, give BB, statin.  Uncontrolled hypertension/hypertension urgency:  Continue hydralazine, prazosin ,hold lisinopril,hold lasix  Continue prn labetalol.  PAF: Currently NSR Continue metoprolol, holding AC as above.  PAD s/p  TKA 4/30:  Appears to have normal healing.  IDT2DM:  HbA1c 8.6%.  Continue current management, CBG elevated although currently holding off on further modification of medication due to change in steroid dose.  Morbid obesity: Body mass index is 36.04 kg/m.   Thrombocytopenia:  - Hold DOAC/ASA as above.   DVT prophylaxis: SCDs Code Status: Full Family Communication: None at bedside Disposition Plan:  Status is: Inpatient  Remains inpatient appropriate because:Ongoing diagnostic testing needed not appropriate for outpatient work up   Dispo: The patient is from: Home              Anticipated d/c is to: Home              Anticipated d/c date is: 2 days              Patient currently is not medically stable to d/c.  Consultants:   GI  Nephrology  Procedures:   EGD 05/24/2020:  Impression:       - Normal esophagus.                           - Normal stomach.                           - Duodenitis.                           - Normal third portion of the duodenum.                           -  No specimens collected. Recommendation:- Return patient to hospital ward for ongoing care.                           - Clear liquid diet today.                           - Continue present medications including                            pantoprazole 40 mg po bid.                           - Duodenitis is a potential source for blood loss.                            Recommend performing a colonoscopy tomorrow and                            will discuss with patient when he recovers from                            sedation.  Antimicrobials:  None   Subjective: No fever or chills. No nausea and vomiting. No chest pain.   Objective: Vitals:   05/27/20 1326 05/27/20 2002 05/28/20 0503 05/28/20 1357  BP: (!) 160/65 (!) 149/68 (!) 161/68 (!) 175/71  Pulse: 69 69 66 66  Resp: 13 18 20 18   Temp: 97.9 F (36.6 C) 97.7 F (36.5 C) 97.8 F (36.6 C) 98.1 F (36.7 C)  TempSrc:   Oral Oral Oral  SpO2: 98% 95% 96% 97%  Weight:   120.5 kg   Height:        Intake/Output Summary (Last 24 hours) at 05/28/2020 1932 Last data filed at 05/28/2020 1757 Gross per 24 hour  Intake 1482.95 ml  Output 1075 ml  Net 407.95 ml   Filed Weights   05/23/20 1251 05/28/20 0503  Weight: 122.5 kg 120.5 kg   General: Appear in mild distress, no Rash; Oral Mucosa Clear, moist. no Abnormal Neck Mass Or lumps, Conjunctiva normal  Cardiovascular: S1 and S2 Present, no Murmur, Respiratory: good respiratory effort, Bilateral Air entry present and Clear to Auscultation, no Crackles, no wheezes Abdomen: Bowel Sound present, Soft and no tenderness Extremities: improving pedal edema, no calf tenderness Neurology: alert and oriented to time, place, and person affect appropriate. no new focal deficit Gait not checked due to patient safety concerns   Data Reviewed: I have personally reviewed following labs and imaging studies  CBC: Recent Labs  Lab 05/25/20 0513 05/25/20 1308 05/26/20 0533 05/27/20 0529 05/28/20 0607  WBC 4.5 4.4 3.5* 5.5 5.6  HGB 8.4* 10.2* 10.1* 10.3* 9.9*  HCT 26.6* 32.3* 31.8* 32.7* 29.7*  MCV 91.7 92.3 90.1 91.1 87.6  PLT 106* 121* 126* 151 250   Basic Metabolic Panel: Recent Labs  Lab 05/25/20 0513 05/25/20 1308 05/26/20 0533 05/27/20 0529 05/28/20 0607  NA 139 141 137 135 133*  K 4.9 5.6* 5.7* 5.2* 4.9  CL 113* 110 111 109 104  CO2 14* 16* 14* 11* 14*  GLUCOSE 113* 118* 193* 246* 257*  BUN 111* 112* 110* 121* 137*  CREATININE 6.61* 7.10* 7.01* 8.23* 8.23*  CALCIUM 7.9* 8.4* 8.3* 8.4* 8.2*  MG  --  1.8  --   --   --   PHOS  --  8.9*  --  9.3* 9.1*   GFR: Estimated Creatinine Clearance: 12.6 mL/min (A) (by C-G formula based on SCr of 8.23 mg/dL (H)). Liver Function Tests: Recent Labs  Lab 05/23/20 1319 05/23/20 1319 05/24/20 0507 05/24/20 0507 05/25/20 0513 05/25/20 1308 05/26/20 0533 05/27/20 0529 05/28/20 0607  AST 14*  --  14*  --   13* 11* 19  --   --   ALT 20  --  19  --  16 18 23   --   --   ALKPHOS 105  --  90  --  90 96 87  --   --   BILITOT 0.3  --  0.6  --  0.5 0.5 0.5  --   --   PROT 6.9  --  6.4*  --  6.6 6.6 6.6  --   --   ALBUMIN 3.5   < > 3.0*   < > 3.3* 3.5 3.0* 3.4* 3.1*   < > = values in this interval not displayed.   No results for input(s): LIPASE, AMYLASE in the last 168 hours. No results for input(s): AMMONIA in the last 168 hours. Coagulation Profile: Recent Labs  Lab 05/26/20 0851  INR 1.2   Cardiac Enzymes: No results for input(s): CKTOTAL, CKMB, CKMBINDEX, TROPONINI in the last 168 hours. BNP (last 3 results) No results for input(s): PROBNP in the last 8760 hours. HbA1C: No results for input(s): HGBA1C in the last 72 hours. CBG: Recent Labs  Lab 05/28/20 0023 05/28/20 0459 05/28/20 0728 05/28/20 1157 05/28/20 1638  GLUCAP 248* 236* 214* 298* 298*   Lipid Profile: No results for input(s): CHOL, HDL, LDLCALC, TRIG, CHOLHDL, LDLDIRECT in the last 72 hours. Thyroid Function Tests: No results for input(s): TSH, T4TOTAL, FREET4, T3FREE, THYROIDAB in the last 72 hours. Anemia Panel: No results for input(s): VITAMINB12, FOLATE, FERRITIN, TIBC, IRON, RETICCTPCT in the last 72 hours. Urine analysis:    Component Value Date/Time   COLORURINE YELLOW 05/23/2020 2057   APPEARANCEUR CLEAR 05/23/2020 2057   LABSPEC 1.008 05/23/2020 2057   PHURINE 5.0 05/23/2020 2057   GLUCOSEU 50 (A) 05/23/2020 2057   HGBUR LARGE (A) 05/23/2020 2057   BILIRUBINUR NEGATIVE 05/23/2020 2057   BILIRUBINUR neg 10/01/2014 1445   KETONESUR NEGATIVE 05/23/2020 2057   PROTEINUR 100 (A) 05/23/2020 2057   UROBILINOGEN 0.2 10/01/2014 1445   UROBILINOGEN 0.2 09/30/2014 2051   NITRITE NEGATIVE 05/23/2020 2057   LEUKOCYTESUR NEGATIVE 05/23/2020 2057   Recent Results (from the past 240 hour(s))  SARS Coronavirus 2 by RT PCR (hospital order, performed in [facility] hospital lab) Nasopharyngeal Nasopharyngeal Swab      Status: None   Collection Time: 05/23/20  4:00 PM   Specimen: Nasopharyngeal Swab  Result Value Ref Range Status   SARS Coronavirus 2 NEGATIVE NEGATIVE Final    Comment: (NOTE) SARS-CoV-2 target nucleic acids are NOT DETECTED.  The SARS-CoV-2 RNA is generally detectable in upper and lower respiratory specimens during the acute phase of infection. The lowest concentration of SARS-CoV-2 viral copies this assay can detect is 250 copies / mL. A negative result does not preclude SARS-CoV-2 infection and should not be used as the sole basis for treatment or other patient management decisions.  A negative result may occur with improper specimen collection / handling, submission of specimen other than nasopharyngeal swab, presence of viral  mutation(s) within the areas targeted by this assay, and inadequate number of viral copies (<250 copies / mL). A negative result must be combined with clinical observations, patient history, and epidemiological information.  Fact Sheet for Patients:   StrictlyIdeas.no  Fact Sheet for Healthcare Providers: BankingDealers.co.za  This test is not yet approved or  cleared by the [place] FDA and has been authorized for detection and/or diagnosis of SARS-CoV-2 by FDA under an Emergency Use Authorization (EUA).  This EUA will remain in effect (meaning this test can be used) for the duration of the COVID-19 declaration under Section 564(b)(1) of the Act, 21 U.S.C. section 360bbb-3(b)(1), unless the authorization is terminated or revoked sooner.  Performed at [facility], [place] [address], [place], [place] [address]       Radiology Studies: No results found.  Scheduled Meds: . amLODipine  5 mg Oral Daily  . atorvastatin  20 mg Oral Daily  . folic acid  1 mg Oral Daily  . hydrALAZINE  100 mg Oral TID  . insulin aspart  0-15 Units Subcutaneous Q4H  . insulin glargine  8 Units  Subcutaneous BID  . LORazepam  0-4 mg Intravenous Q12H  . metoprolol tartrate  100 mg Oral BID  . multivitamin with minerals  1 tablet Oral Daily  . pantoprazole (PROTONIX) IV  40 mg Intravenous Q12H  . prazosin  2 mg Oral QPC supper  . predniSONE  60 mg Oral Q breakfast  . sodium bicarbonate  650 mg Oral TID  . sodium zirconium cyclosilicate  10 g Oral BID  . thiamine  100 mg Oral Daily   Or  . thiamine  100 mg Intravenous Daily   Continuous Infusions: .  sodium bicarbonate (isotonic) infusion in sterile water 100 mL/hr at 05/28/20 1242     LOS: 5 days   Time spent: 25 minutes.  Berle Mull, MD [facility] [email or web address] 05/28/2020, 7:32 PM

## 2020-05-28 NOTE — Progress Notes (Signed)
Resumed care for this pt at 2300. I agree with previous RNs assessment. Pt resting in bed at this time. 

## 2020-05-29 LAB — RENAL FUNCTION PANEL
Albumin: 3.2 g/dL — ABNORMAL LOW (ref 3.5–5.0)
Anion gap: 14 (ref 5–15)
BUN: 129 mg/dL — ABNORMAL HIGH (ref 8–23)
CO2: 21 mmol/L — ABNORMAL LOW (ref 22–32)
Calcium: 7.9 mg/dL — ABNORMAL LOW (ref 8.9–10.3)
Chloride: 102 mmol/L (ref 98–111)
Creatinine, Ser: 8.67 mg/dL — ABNORMAL HIGH (ref 0.61–1.24)
GFR calc Af Amer: 7 mL/min — ABNORMAL LOW (ref >60)
GFR calc non Af Amer: 6 mL/min — ABNORMAL LOW (ref >60)
Glucose, Bld: 176 mg/dL — ABNORMAL HIGH (ref 70–99)
Phosphorus: 9.9 mg/dL — ABNORMAL HIGH (ref 2.5–4.6)
Potassium: 3.9 mmol/L (ref 3.5–5.1)
Sodium: 137 mmol/L (ref 135–145)

## 2020-05-29 LAB — CBC
HCT: 30.4 % — ABNORMAL LOW (ref 39.0–52.0)
Hemoglobin: 9.9 g/dL — ABNORMAL LOW (ref 13.0–17.0)
MCH: 28.8 pg (ref 26.0–34.0)
MCHC: 32.6 g/dL (ref 30.0–36.0)
MCV: 88.4 fL (ref 80.0–100.0)
Platelets: 174 10*3/uL (ref 150–400)
RBC: 3.44 MIL/uL — ABNORMAL LOW (ref 4.22–5.81)
RDW: 14.4 % (ref 11.5–15.5)
WBC: 7.7 10*3/uL (ref 4.0–10.5)
nRBC: 0 % (ref 0.0–0.2)

## 2020-05-29 LAB — GLUCOSE, CAPILLARY
Glucose-Capillary: 145 mg/dL — ABNORMAL HIGH (ref 70–99)
Glucose-Capillary: 178 mg/dL — ABNORMAL HIGH (ref 70–99)
Glucose-Capillary: 203 mg/dL — ABNORMAL HIGH (ref 70–99)
Glucose-Capillary: 219 mg/dL — ABNORMAL HIGH (ref 70–99)
Glucose-Capillary: 230 mg/dL — ABNORMAL HIGH (ref 70–99)
Glucose-Capillary: 241 mg/dL — ABNORMAL HIGH (ref 70–99)

## 2020-05-29 MED ORDER — HEPARIN SODIUM (PORCINE) 5000 UNIT/ML IJ SOLN
5000.0000 [IU] | Freq: Three times a day (TID) | INTRAMUSCULAR | Status: AC
  Administered 2020-05-30 (×3): 5000 [IU] via SUBCUTANEOUS
  Filled 2020-05-29 (×3): qty 1

## 2020-05-29 MED ORDER — HEPARIN SODIUM (PORCINE) 5000 UNIT/ML IJ SOLN: 5000.0000 [IU] | Freq: Three times a day (TID) | INTRAMUSCULAR | Status: DC

## 2020-05-29 MED ORDER — PANTOPRAZOLE SODIUM 40 MG PO TBEC
40.0000 mg | DELAYED_RELEASE_TABLET | Freq: Two times a day (BID) | ORAL | Status: DC
  Administered 2020-05-29 – 2020-06-05 (×14): 40 mg via ORAL
  Filled 2020-05-29 (×14): qty 1

## 2020-05-29 NOTE — Progress Notes (Signed)
PROGRESS NOTE  Alexander [name]  OXB:[id number] DOB: [birth year] DOA: 05/23/2020 PCP: [name], MD   Brief Narrative: Per HPI: Alexander [name] is a 59 y.o. male with medical history significant for A. fib on Eliquis,recent right TKA 4/30,CAD s/ PTCA 2015,PVD,s/p PM,HLD,CKD stage IV baseline creatinine 2.5-3 followed by Dr. Justin Mend on ASA/Lipitor/Lasix 40 mg bid/hydralazine,lisinopril,prazosin, diabetes mellitus on long-term insulin with NPH 70/30, 56  Ac BF and 46 U before supper to the ED for evaluation of black tarry stool for 2 days.Patient has had 3 bowel movements in the past 2 days,has not had any iron or Pepto-Bismol. He has had no previous history of GI bleed.  Patient is any chest pain, nausea, vomiting, abdominal pain fever or chills. Patient reports she has had EGD and colonoscopy in 2015 and was unremarkable.   Presents with GI bleed and found to have AKI on CKD. Underwent EGD on 05/24/2020, colonoscopy on 05/25/2020 and IR guided renal biopsy on 05/26/2020. Renal function progressively continues to worsen and currently plan is to transfer to [place] to observe renal function but no indication for HD for now.  Assessment & Plan: GI bleeding, duodenitis:  H&H now stable. Underwent EGD and colonoscopy. EGD showed duodenitis.  Likely a potential source for GI bleeding. Colonoscopy showed no acute bleeding and no acute abnormality. Continue PPI twice daily. Patient was started back on anticoagulation by GI.  AKI on stage IV CKD:  Baseline creatinine 2.5-3 due to diabetic nephropathy.  BUN elevation at least in part due to GIB. Appreciate nephrology assistance. Holding diuretic, ACE.  Continue IVF, monitor I/O. No indications for HD.  Added lokelma Patient was also started on high-dose IV steroids.  Now steroids are tapering off. Started on bicarb. Underwent renal biopsy. Monitor renal function.  Monitor urine output. Stabilizing now.   Hyperkalemia:    Improved w/fluids, lokelma.  Continue monitoring.    Metabolic acidosis: Normal anion gap.  Due to renal impairment. Receiving bicarb therapy by nephrology.  CAD s/ PTCA 2015, PVD, HLD, s/p PPM:  No anginal complaints. With active bleeding and CAD, would use transfusion threshold of 8g/dl.  Hold ASA, give BB, statin.  Uncontrolled hypertension/hypertension urgency:  Continue hydralazine, prazosin ,hold lisinopril,hold lasix  Continue prn labetalol.  PAF: Currently NSR Continue metoprolol, holding AC as above.  PAD s/p BKA 4/30:  Appears to have normal healing.  Type 2 diabetes mellitus, uncontrolled with hyperglycemia HbA1c 8.6%.  Continue current management, CBG elevated although currently holding off on further modification of medication due to change in steroid dose.  Morbid obesity: Body mass index is 36.51 kg/m.   Thrombocytopenia:  - Hold DOAC/ASA as above.   DVT prophylaxis: Heparin Code Status: Full Family Communication: None at bedside Disposition Plan:  Status is: Inpatient  Remains inpatient appropriate because:Ongoing diagnostic testing needed not appropriate for outpatient work up   Dispo: The patient is from: Home              Anticipated d/c is to: Home              Anticipated d/c date is: 2 days              Patient currently is not medically stable to d/c.  Consultants:   GI  Nephrology  Procedures:   EGD 05/24/2020:  Impression:       - Normal esophagus.                           -  Normal stomach.                           - Duodenitis.                           - Normal third portion of the duodenum.                           - No specimens collected. Recommendation:- Return patient to hospital ward for ongoing care.                           - Clear liquid diet today.                           - Continue present medications including                            pantoprazole 40 mg po bid.                           - Duodenitis is  a potential source for blood loss.                            Recommend performing a colonoscopy tomorrow and                            will discuss with patient when he recovers from                            sedation.  Antimicrobials:  None   Subjective: Denies any acute complaint  With that he is actually feeling great.  No nausea no vomiting.  No itching.  No dizziness or lightheadedness.  No confusion.  Oral diet is improved  Objective: Vitals:   05/28/20 2046 05/29/20 0414 05/29/20 0417 05/29/20 1237  BP: (!) 167/65 (!) 172/72  (!) 160/63  Pulse: 64 75  62  Resp: 18 18  16   Temp: 97.8 F (36.6 C) 98.3 F (36.8 C)  97.9 F (36.6 C)  TempSrc: Oral Oral  Oral  SpO2: 97% 97%  98%  Weight:   122.1 kg   Height:        Intake/Output Summary (Last 24 hours) at 05/29/2020 1941 Last data filed at 05/29/2020 1707 Gross per 24 hour  Intake 1160 ml  Output 400 ml  Net 760 ml   Filed Weights   05/23/20 1251 05/28/20 0503 05/29/20 0417  Weight: 122.5 kg 120.5 kg 122.1 kg   General: Appear in mild distress, no Rash; Oral Mucosa Clear, moist. no Abnormal Neck Mass Or lumps, Conjunctiva normal  Cardiovascular: S1 and S2 Present, no Murmur, Respiratory: good respiratory effort, Bilateral Air entry present and Clear to Auscultation, no Crackles, no wheezes Abdomen: Bowel Sound present, Soft and no tenderness Extremities: improving pedal edema, no calf tenderness Neurology: alert and oriented to time, place, and person affect appropriate. no new focal deficit Gait not checked due to patient safety concerns   Data Reviewed: I have personally reviewed following labs and imaging studies  CBC: Recent Labs  Lab 05/25/20 1308 05/26/20  2836 05/27/20 0529 05/28/20 0607 05/29/20 0629  WBC 4.4 3.5* 5.5 5.6 7.7  HGB 10.2* 10.1* 10.3* 9.9* 9.9*  HCT 32.3* 31.8* 32.7* 29.7* 30.4*  MCV 92.3 90.1 91.1 87.6 88.4  PLT 121* 126* 151 165 629   Basic Metabolic Panel: Recent Labs  Lab  05/25/20 1308 05/26/20 0533 05/27/20 0529 05/28/20 0607 05/29/20 0629  NA 141 137 135 133* 137  K 5.6* 5.7* 5.2* 4.9 3.9  CL 110 111 109 104 102  CO2 16* 14* 11* 14* 21*  GLUCOSE 118* 193* 246* 257* 176*  BUN 112* 110* 121* 137* 129*  CREATININE 7.10* 7.01* 8.23* 8.23* 8.67*  CALCIUM 8.4* 8.3* 8.4* 8.2* 7.9*  MG 1.8  --   --   --   --   PHOS 8.9*  --  9.3* 9.1* 9.9*   GFR: Estimated Creatinine Clearance: 12.1 mL/min (A) (by C-G formula based on SCr of 8.67 mg/dL (H)). Liver Function Tests: Recent Labs  Lab 05/23/20 1319 05/23/20 1319 05/24/20 0507 05/24/20 0507 05/25/20 0513 05/25/20 0513 05/25/20 1308 05/26/20 0533 05/27/20 0529 05/28/20 0607 05/29/20 0629  AST 14*  --  14*  --  13*  --  11* 19  --   --   --   ALT 20  --  19  --  16  --  18 23  --   --   --   ALKPHOS 105  --  90  --  90  --  96 87  --   --   --   BILITOT 0.3  --  0.6  --  0.5  --  0.5 0.5  --   --   --   PROT 6.9  --  6.4*  --  6.6  --  6.6 6.6  --   --   --   ALBUMIN 3.5   < > 3.0*   < > 3.3*   < > 3.5 3.0* 3.4* 3.1* 3.2*   < > = values in this interval not displayed.   No results for input(s): LIPASE, AMYLASE in the last 168 hours. No results for input(s): AMMONIA in the last 168 hours. Coagulation Profile: Recent Labs  Lab 05/26/20 0851  INR 1.2   Cardiac Enzymes: No results for input(s): CKTOTAL, CKMB, CKMBINDEX, TROPONINI in the last 168 hours. BNP (last 3 results) No results for input(s): PROBNP in the last 8760 hours. HbA1C: No results for input(s): HGBA1C in the last 72 hours. CBG: Recent Labs  Lab 05/28/20 2358 05/29/20 0411 05/29/20 0717 05/29/20 1134 05/29/20 1705  GLUCAP 228* 203* 145* 178* 230*   Lipid Profile: No results for input(s): CHOL, HDL, LDLCALC, TRIG, CHOLHDL, LDLDIRECT in the last 72 hours. Thyroid Function Tests: No results for input(s): TSH, T4TOTAL, FREET4, T3FREE, THYROIDAB in the last 72 hours. Anemia Panel: No results for input(s): VITAMINB12,  FOLATE, FERRITIN, TIBC, IRON, RETICCTPCT in the last 72 hours. Urine analysis:    Component Value Date/Time   COLORURINE YELLOW 05/23/2020 2057   APPEARANCEUR CLEAR 05/23/2020 2057   LABSPEC 1.008 05/23/2020 2057   PHURINE 5.0 05/23/2020 2057   GLUCOSEU 50 (A) 05/23/2020 2057   HGBUR LARGE (A) 05/23/2020 2057   BILIRUBINUR NEGATIVE 05/23/2020 2057   BILIRUBINUR neg 10/01/2014 1445   KETONESUR NEGATIVE 05/23/2020 2057   PROTEINUR 100 (A) 05/23/2020 2057   UROBILINOGEN 0.2 10/01/2014 1445   UROBILINOGEN 0.2 09/30/2014 2051   NITRITE NEGATIVE 05/23/2020 2057   LEUKOCYTESUR NEGATIVE 05/23/2020 2057   Recent Results (from  the past 240 hour(s))  SARS Coronavirus 2 by RT PCR (hospital order, performed in [facility] hospital lab) Nasopharyngeal Nasopharyngeal Swab     Status: None   Collection Time: 05/23/20  4:00 PM   Specimen: Nasopharyngeal Swab  Result Value Ref Range Status   SARS Coronavirus 2 NEGATIVE NEGATIVE Final    Comment: (NOTE) SARS-CoV-2 target nucleic acids are NOT DETECTED.  The SARS-CoV-2 RNA is generally detectable in upper and lower respiratory specimens during the acute phase of infection. The lowest concentration of SARS-CoV-2 viral copies this assay can detect is 250 copies / mL. A negative result does not preclude SARS-CoV-2 infection and should not be used as the sole basis for treatment or other patient management decisions.  A negative result may occur with improper specimen collection / handling, submission of specimen other than nasopharyngeal swab, presence of viral mutation(s) within the areas targeted by this assay, and inadequate number of viral copies (<250 copies / mL). A negative result must be combined with clinical observations, patient history, and epidemiological information.  Fact Sheet for Patients:   StrictlyIdeas.no  Fact Sheet for Healthcare Providers: BankingDealers.co.za  This test is  not yet approved or  cleared by the [place] FDA and has been authorized for detection and/or diagnosis of SARS-CoV-2 by FDA under an Emergency Use Authorization (EUA).  This EUA will remain in effect (meaning this test can be used) for the duration of the COVID-19 declaration under Section 564(b)(1) of the Act, 21 U.S.C. section 360bbb-3(b)(1), unless the authorization is terminated or revoked sooner.  Performed at [facility], [place] [address] [place], [place] [address]       Radiology Studies: No results found.  Scheduled Meds: . amLODipine  5 mg Oral Daily  . atorvastatin  20 mg Oral Daily  . folic acid  1 mg Oral Daily  . hydrALAZINE  100 mg Oral TID  . insulin aspart  0-15 Units Subcutaneous Q4H  . insulin glargine  8 Units Subcutaneous BID  . metoprolol tartrate  100 mg Oral BID  . multivitamin with minerals  1 tablet Oral Daily  . pantoprazole  40 mg Oral BID  . prazosin  2 mg Oral QPC supper  . predniSONE  60 mg Oral Q breakfast  . sodium bicarbonate  650 mg Oral TID  . sodium zirconium cyclosilicate  10 g Oral BID  . thiamine  100 mg Oral Daily   Or  . thiamine  100 mg Intravenous Daily   Continuous Infusions:    LOS: 6 days   Time spent: 25 minutes.  Berle Mull, MD [facility] [email or web address] 05/29/2020, 7:41 PM

## 2020-05-29 NOTE — Progress Notes (Signed)
[facility] RN called handoff report to Gwenlyn Perking, RN at [facility] who will be receiving patient upon [facility] transport.

## 2020-05-29 NOTE — Progress Notes (Signed)
Patient ID: Alexander [name], male   DOB: 1961-11-05, 59 y.o.   MRN: [id number] [facility] Progress Note   Assessment/ Plan:   1. Acute kidney Injury on chronic kidney disease stage IV: Baseline creatinine ranging 2.5-3.0 and admitted with creatinine of 6.8/potassium 5.8 following which he underwent renal biopsy because of proteinuria and hematuria.  Empirically on corticosteroids, serologies negative to date and without evidence of plasma cell dyscrasia.  Nonoliguric with worsening renal function noted overnight; will arrange to have him transferred to [facility] in case this keeps worsening and he needs dialysis.  Surprisingly, he does not have any uremic symptoms or acute indications for dialysis at this time. 2.  GI bleed/melena: Suspected to be secondary to duodenitis based on EGD/colonoscopy and currently on PPI.  He is hemodynamically stable and without indications for PRBC transfusion. 3.  Hypertension: Blood pressures marginally elevated on hydralazine/prazosin and metoprolol.  Lisinopril and furosemide currently on hold. 4.  Peripheral vascular disease status post right BKA with intact dressing-malodorous. 5.  Type 2 diabetes mellitus: Insulin-dependent and likely the predominating etiology of his underlying chronic kidney disease.  Subjective:   Reports to be feeling well and surprised that his kidney function is worse.  He denies any chest pain, shortness of breath, changes in sleep/wake and any abnormal limb jerking movements.   Objective:   BP (!) 172/72 (BP Location: Left Arm)   Pulse 75   Temp 98.3 F (36.8 C) (Oral)   Resp 18   Ht 6' (1.829 m)   Wt 122.1 kg   SpO2 97%   BMI 36.51 kg/m   Intake/Output Summary (Last 24 hours) at 05/29/2020 1120 Last data filed at 05/29/2020 4132 Gross per 24 hour  Intake 1126.23 ml  Output 800 ml  Net 326.23 ml   Weight change: 1.579 kg  Physical Exam: Gen: Comfortably laying flat in bed, watching television CVS: Pulse  regular rhythm, normal rate, S1 and S2 normal Resp: Clear to auscultation, no rales/rhonchi Abd: Soft, obese, nontender Ext: Trace left leg/ankle edema. Right leg status post BKA with intact dressing/shrinker  Imaging: No results found.  Labs: BMET Recent Labs  Lab 05/24/20 0507 05/25/20 0513 05/25/20 1308 05/26/20 0533 05/27/20 0529 05/28/20 0607 05/29/20 0629  NA 135 139 141 137 135 133* 137  K 5.0 4.9 5.6* 5.7* 5.2* 4.9 3.9  CL 105 113* 110 111 109 104 102  CO2 17* 14* 16* 14* 11* 14* 21*  GLUCOSE 140* 113* 118* 193* 246* 257* 176*  BUN 111* 111* 112* 110* 121* 137* 129*  CREATININE 6.99* 6.61* 7.10* 7.01* 8.23* 8.23* 8.67*  CALCIUM 8.2* 7.9* 8.4* 8.3* 8.4* 8.2* 7.9*  PHOS  --   --  8.9*  --  9.3* 9.1* 9.9*   CBC Recent Labs  Lab 05/26/20 0533 05/27/20 0529 05/28/20 0607 05/29/20 0629  WBC 3.5* 5.5 5.6 7.7  HGB 10.1* 10.3* 9.9* 9.9*  HCT 31.8* 32.7* 29.7* 30.4*  MCV 90.1 91.1 87.6 88.4  PLT 126* 151 165 174    Medications:    . amLODipine  5 mg Oral Daily  . atorvastatin  20 mg Oral Daily  . folic acid  1 mg Oral Daily  . hydrALAZINE  100 mg Oral TID  . insulin aspart  0-15 Units Subcutaneous Q4H  . insulin glargine  8 Units Subcutaneous BID  . LORazepam  0-4 mg Intravenous Q12H  . metoprolol tartrate  100 mg Oral BID  . multivitamin with minerals  1 tablet Oral  Daily  . pantoprazole  40 mg Oral BID  . prazosin  2 mg Oral QPC supper  . predniSONE  60 mg Oral Q breakfast  . sodium bicarbonate  650 mg Oral TID  . sodium zirconium cyclosilicate  10 g Oral BID  . thiamine  100 mg Oral Daily   Or  . thiamine  100 mg Intravenous Daily   Elmarie Shiley, MD 05/29/2020, 11:20 AM

## 2020-05-30 LAB — CBC
HCT: 31.2 % — ABNORMAL LOW (ref 39.0–52.0)
Hemoglobin: 10.3 g/dL — ABNORMAL LOW (ref 13.0–17.0)
MCH: 28.7 pg (ref 26.0–34.0)
MCHC: 33 g/dL (ref 30.0–36.0)
MCV: 86.9 fL (ref 80.0–100.0)
Platelets: 142 10*3/uL — ABNORMAL LOW (ref 150–400)
RBC: 3.59 MIL/uL — ABNORMAL LOW (ref 4.22–5.81)
RDW: 14.2 % (ref 11.5–15.5)
WBC: 7.8 10*3/uL (ref 4.0–10.5)
nRBC: 0 % (ref 0.0–0.2)

## 2020-05-30 LAB — RENAL FUNCTION PANEL
Albumin: 3.1 g/dL — ABNORMAL LOW (ref 3.5–5.0)
Anion gap: 16 — ABNORMAL HIGH (ref 5–15)
BUN: 158 mg/dL — ABNORMAL HIGH (ref 8–23)
CO2: 19 mmol/L — ABNORMAL LOW (ref 22–32)
Calcium: 7.9 mg/dL — ABNORMAL LOW (ref 8.9–10.3)
Chloride: 102 mmol/L (ref 98–111)
Creatinine, Ser: 8.64 mg/dL — ABNORMAL HIGH (ref 0.61–1.24)
GFR calc Af Amer: 7 mL/min — ABNORMAL LOW (ref >60)
GFR calc non Af Amer: 6 mL/min — ABNORMAL LOW (ref >60)
Glucose, Bld: 129 mg/dL — ABNORMAL HIGH (ref 70–99)
Phosphorus: 10.5 mg/dL — ABNORMAL HIGH (ref 2.5–4.6)
Potassium: 4 mmol/L (ref 3.5–5.1)
Sodium: 137 mmol/L (ref 135–145)

## 2020-05-30 LAB — GLUCOSE, CAPILLARY
Glucose-Capillary: 115 mg/dL — ABNORMAL HIGH (ref 70–99)
Glucose-Capillary: 150 mg/dL — ABNORMAL HIGH (ref 70–99)
Glucose-Capillary: 188 mg/dL — ABNORMAL HIGH (ref 70–99)
Glucose-Capillary: 234 mg/dL — ABNORMAL HIGH (ref 70–99)
Glucose-Capillary: 236 mg/dL — ABNORMAL HIGH (ref 70–99)
Glucose-Capillary: 261 mg/dL — ABNORMAL HIGH (ref 70–99)

## 2020-05-30 MED ORDER — CHLORHEXIDINE GLUCONATE CLOTH 2 % EX PADS
6.0000 | MEDICATED_PAD | Freq: Every day | CUTANEOUS | Status: DC
  Administered 2020-05-31: 6 via TOPICAL

## 2020-05-30 MED ORDER — AMLODIPINE BESYLATE 10 MG PO TABS
10.0000 mg | ORAL_TABLET | Freq: Every day | ORAL | Status: DC
  Administered 2020-06-01 – 2020-06-05 (×5): 10 mg via ORAL
  Filled 2020-05-30 (×5): qty 1

## 2020-05-30 MED ORDER — AMLODIPINE BESYLATE 5 MG PO TABS
5.0000 mg | ORAL_TABLET | Freq: Once | ORAL | Status: AC
  Administered 2020-05-30: 5 mg via ORAL
  Filled 2020-05-30: qty 1

## 2020-05-30 NOTE — Progress Notes (Signed)
Patient ID: Alexander [name], male   DOB: August 04, 1961, 59 y.o.   MRN: [id number] [facility] Progress Note   Assessment/ Plan:   1. Acute kidney Injury on chronic kidney disease stage IV: Baseline creatinine ranging 2.5-3.0 and admitted with creatinine of 6.8/potassium 5.8 following which he underwent renal biopsy because of proteinuria and hematuria.  Empirically on corticosteroids, serologies in process and without evidence of plasma cell dyscrasia. Hepatitis nonreactive - Start HD tomorrow after access placement - HD on 7/20 and 7/21 then assess needs daily   - Consult IR for tunneled catheter for hemodialysis for 7/20  - note GBM pending, MPO/PR3 pending, free light chain ratio pending - will need to interpret renal bx results in context of ANCA studies once they are available - Discontinue scheduled lokelma - for now on pred 60 mg daily  - Please defer eliquis - placing tunneled catheter  2.  GI bleed/melena: Suspected to be secondary to duodenitis based on EGD/colonoscopy and currently on PPI.  He is hemodynamically stable and without indications for PRBC transfusion.  3.  Hypertension: Increase amlodipine to 10 mg daily.  Lisinopril and furosemide currently on hold.  4.  Peripheral vascular disease status post right BKA with intact dressing-malodorous.  5. Metabolic acidosis - stop bicarbonate once on HD  6.  Type 2 diabetes mellitus: Insulin-dependent and likely the predominating etiology of his underlying chronic kidney disease.  Subjective:   Renal biopsy prelim returned with diabetic nephropathy with nodular glomerulosclerosis and rare extra capillary proliferations interpreted to represent pseudocrescents.  Focal acute tubular injury with intratubular red blood cell casts moderate to severe interstitial fibrosis and tubular atrophy.  There is no evidence of immune complex mediated GN.  Per path the rare extracapillary proliferations might be best interpreted as diabetes  associated so-called pseudocrescents.  Additional studies in process.  All results should be interpreted in the context of ANCA titers  He and I discussed the risks, benefits, and indications for hemodialysis and he does agree to start dialysis after access placement.  He feels well and doesn't understand why his labs don't reflect.  Spoke with his wife via phone with the patient and she is supportive of him starting dialysis.  Review of systems:  Denies shortness of breath or chest pain  Denies n/v  Denies mental status changes Doesn't have a good appetite but this was after conversation   Objective:   BP (!) 185/74 (BP Location: Left Arm)   Pulse 72   Temp 98 F (36.7 C) (Oral)   Resp (!) 21   Ht 6' (1.829 m)   Wt 122.1 kg   SpO2 97%   BMI 36.51 kg/m   Intake/Output Summary (Last 24 hours) at 05/30/2020 1126 Last data filed at 05/30/2020 1100 Gross per 24 hour  Intake 990 ml  Output 1100 ml  Net -110 ml   Weight change:   Physical Exam:  Gen: adult male in bed in NAD  CVS: S1 and S2 no rub Resp: Clear to auscultation, no rales/rhonchi; unlabored at rest  Abd: Soft, obese, nontender Ext: Trace left leg/ankle edema. Right leg status post BKA with intact dressing/shrinker pysch normal mood and affect Neuro  Alert and oriented x 3 provides hx and follows commands  Imaging: No results found.  Labs: BMET Recent Labs  Lab 05/25/20 0513 05/25/20 1308 05/26/20 0533 05/27/20 0529 05/28/20 0607 05/29/20 0629 05/30/20 0827  NA 139 141 137 135 133* 137 137  K 4.9 5.6* 5.7* 5.2* 4.9  3.9 4.0  CL 113* 110 111 109 104 102 102  CO2 14* 16* 14* 11* 14* 21* 19*  GLUCOSE 113* 118* 193* 246* 257* 176* 129*  BUN 111* 112* 110* 121* 137* 129* 158*  CREATININE 6.61* 7.10* 7.01* 8.23* 8.23* 8.67* 8.64*  CALCIUM 7.9* 8.4* 8.3* 8.4* 8.2* 7.9* 7.9*  PHOS  --  8.9*  --  9.3* 9.1* 9.9* 10.5*   CBC Recent Labs  Lab 05/27/20 0529 05/28/20 0607 05/29/20 0629 05/30/20 0558  WBC  5.5 5.6 7.7 7.8  HGB 10.3* 9.9* 9.9* 10.3*  HCT 32.7* 29.7* 30.4* 31.2*  MCV 91.1 87.6 88.4 86.9  PLT 151 165 174 142*    Medications:    . amLODipine  5 mg Oral Daily  . atorvastatin  20 mg Oral Daily  . folic acid  1 mg Oral Daily  . heparin injection (subcutaneous)  5,000 Units Subcutaneous Q8H  . hydrALAZINE  100 mg Oral TID  . insulin aspart  0-15 Units Subcutaneous Q4H  . insulin glargine  8 Units Subcutaneous BID  . metoprolol tartrate  100 mg Oral BID  . multivitamin with minerals  1 tablet Oral Daily  . pantoprazole  40 mg Oral BID  . prazosin  2 mg Oral QPC supper  . predniSONE  60 mg Oral Q breakfast  . sodium bicarbonate  650 mg Oral TID  . sodium zirconium cyclosilicate  10 g Oral BID  . thiamine  100 mg Oral Daily   Or  . thiamine  100 mg Intravenous Daily   Claudia Desanctis, MD 05/30/2020, 12:16 PM

## 2020-05-30 NOTE — Consult Note (Signed)
[place] Nurse Consult Note: Reason for Consult: Assess right stump wound.  BKA approximately 2 months ago and patient has not seen surgeon (Dr. Eather Colas.) He was supposed to see this week. Wound type: Full thickness wounds at periphery of incision line. Incision line is well approximated Pressure Injury POA: N/A Measurement: Two full thickness wounds at the lateral aspect of the LE at the periphery of the wound. Proximal measures 2cm x 3cm x 0.4cmand distal measures 2cm x 2cm x 0.4cm Wound bed: Pale pink, with 40% yellow slough. Drainage (amount, consistency, odor) moderate amount yellow drainage. Periwound: Excoriations from patient scratching, hygiene issues such as scaling.  Patient reports "dabbing" at wound to clean. Dressing procedure/placement/frequency: Recommend twice daily soap and water cleanse with rinse and pat dry. Xeroform gauze to wounds, topped with dry gauze and secured with roll gauze.  Stump shrinker. Nursing has been provided with guidance via the Orders for this wound care.  Patient to follow up with [name] for continuing care post discharge.  [place] nursing team will not follow, but will remain available to this patient, the nursing and medical teams.  Please re-consult if needed. Thanks, Maudie Flakes, MSN, RN, [place], Arther Abbott  Pager# [phone]

## 2020-05-30 NOTE — Consult Note (Signed)
Chief Complaint: Patient was seen in consultation today for AKI/tunneled HD catheter placement.  Referring Physician(s): Claudia Desanctis  Supervising Physician: Daryll Brod  Patient Status: [facility] - In-pt  History of Present Illness: Alexander [name] is a 59 y.o. male with a past medical history of hypertension, hyperlipidemia, atrial fibrillation on chronic anticoagulation with Eliquis, CAD, sinus node dysfunction s/p pacemaker, PVD, CKD stage IV, diabetes mellitus, anemia, osteomyelitis s/p right BKA 03/2020, arthritis cataracts, and alcohol abuse. He presented to [facility] ED 05/23/2020 secondary to dark stools. In ED, stool was heme positive. GI was consulted and he was admitted for further management. He underwent EGD 05/24/2020 (revealing duodenitis) and colonoscopy 05/25/2020 (normal exam). Currently hemodynamically stable from GI bleed standpoint. Due to CKD with worsening renal function on admission, nephrology was also consulted who recommends patient initiate HD at this time, along with IR consultation for possible HD catheter placement.  IR consulted by Dr. Royce Macadamia for possible image-guided tunneled HD catheter placement. Patient awake and alert sitting in bed watching TV with no complaints at this time. Denies fever, chills, chest pain, dyspnea, abdominal pain, or headache.   Past Medical History:  Diagnosis Date  . Alcohol abuse   . Anemia   . Arthritis    "patient does not think so."  . Atrial fibrillation ([place])   . Cardiac syncope 10/07/14   rapid A fib with 8 sec pauses on converison with syncope- temp pacing wire placed then PPM  . Cataract    BILATERAL  . Chronic kidney disease   . Coronary artery disease   . Diabetes mellitus    dx---been  awhile  . Encounter for care of pacemaker 10/08/2014  . History of blood transfusion   . Hyperlipemia   . Hypertension   . Osteomyelitis ([place])    right foot  . Peripheral vascular disease ([place])   . Presence of permanent cardiac  pacemaker 10/08/2014  . Sinus node dysfunction ([place]) 10/08/2014    Past Surgical History:  Procedure Laterality Date  . ABDOMINAL AORTOGRAM W/LOWER EXTREMITY N/A 09/09/2017   Procedure: ABDOMINAL AORTOGRAM W/LOWER EXTREMITY;  Surgeon: Angelia Mould, MD;  Location: [place] [facility];  Service: Cardiovascular;  Laterality: N/A;  . ABDOMINAL AORTOGRAM W/LOWER EXTREMITY Right 11/11/2019   Procedure: ABDOMINAL AORTOGRAM W/LOWER EXTREMITY;  Surgeon: Marty Heck, MD;  Location: [place] [facility];  Service: Cardiovascular;  Laterality: Right;  . AMPUTATION Left 08/19/2013   Procedure: AMPUTATION RAY;  Surgeon: Alta Corning, MD;  Location: [place];  Service: Orthopedics;  Laterality: Left;  ray amputation left 5th  . AMPUTATION Left 10/27/2013   Procedure: AMPUTATION DIGIT-LEFT 4TH TOE, 4th and 5th metatarsal.;  Surgeon: Angelia Mould, MD;  Location: [place];  Service: Vascular;  Laterality: Left;  . AMPUTATION Left 11/04/2013   Procedure: LEFT FOOT TRANS-METATARSAL AMPUTATION WITH WOUND CLOSURE ;  Surgeon: Alta Corning, MD;  Location: [place];  Service: Orthopedics;  Laterality: Left;  . AMPUTATION Right 09/10/2017   Procedure: RIGHT FOURTH AND FIFTH TOE AMPUTATION;  Surgeon: Angelia Mould, MD;  Location: [place];  Service: Vascular;  Laterality: Right;  . AMPUTATION Right 02/19/2020   Procedure: AMPUTATION RIGHT 3RD TOE;  Surgeon: Angelia Mould, MD;  Location: [place];  Service: Vascular;  Laterality: Right;  . AMPUTATION Right 03/11/2020   Procedure: RIGHT BELOW KNEE AMPUTATION;  Surgeon: Newt Minion, MD;  Location: [place];  Service: Orthopedics;  Laterality: Right;  . APPLICATION OF WOUND VAC Right  02/19/2020   Procedure: Application Of Wound Vac Right foot;  Surgeon: Angelia Mould, MD;  Location: [place];  Service: Vascular;  Laterality: Right;  . CARDIAC CATHETERIZATION  10/07/2014   Procedure: LEFT HEART CATH AND CORONARY ANGIOGRAPHY;  Surgeon:  Laverda Page, MD;  Location: [facility] CATH LAB;  Service: Cardiovascular;;  . COLONOSCOPY W/ POLYPECTOMY    . COLONOSCOPY WITH PROPOFOL N/A 05/25/2020   Procedure: COLONOSCOPY WITH PROPOFOL;  Surgeon: Gatha Mayer, MD;  Location: [facility];  Service: Endoscopy;  Laterality: N/A;  . EMBOLECTOMY Right 09/12/2017   Procedure: Thrombectomy  & Redo Right Below Knee Popliteal Artery Bypass Graft  ;  Surgeon: Waynetta Sandy, MD;  Location: [place];  Service: Vascular;  Laterality: Right;  . ESOPHAGOGASTRODUODENOSCOPY (EGD) WITH PROPOFOL N/A 05/24/2020   Procedure: ESOPHAGOGASTRODUODENOSCOPY (EGD) WITH PROPOFOL;  Surgeon: Ladene Artist, MD;  Location: [facility];  Service: Endoscopy;  Laterality: N/A;  . EYE SURGERY Bilateral    cataract  . FEMORAL-TIBIAL BYPASS GRAFT Left 10/27/2013   Procedure: BYPASS GRAFT LEFT FEMORAL- POSTERIOR TIBIAL ARTERY;  Surgeon: Angelia Mould, MD;  Location: [place];  Service: Vascular;  Laterality: Left;  . FEMORAL-TIBIAL BYPASS GRAFT Right 09/10/2017   Procedure: RIGHT SUPERFICIAL  FEMORAL ARTERY-BELOW KNEE POPLITEAL ARTERY BYPASS GRAFT WITH VEIN;  Surgeon: Angelia Mould, MD;  Location: [place];  Service: Vascular;  Laterality: Right;  . I & D EXTREMITY Left 10/27/2013   Procedure: IRRIGATION AND DEBRIDEMENT EXTREMITY- LEFT FOOT;  Surgeon: Angelia Mould, MD;  Location: [place];  Service: Vascular;  Laterality: Left;  . I & D EXTREMITY Right 01/26/2020   Procedure: IRRIGATION AND DEBRIDEMENT EXTREMITY RIGHT FOOT WOUND;  Surgeon: Angelia Mould, MD;  Location: [place];  Service: Vascular;  Laterality: Right;  . INTRAOPERATIVE ARTERIOGRAM Right 09/10/2017   Procedure: INTRA OPERATIVE ARTERIOGRAM;  Surgeon: Angelia Mould, MD;  Location: [place];  Service: Vascular;  Laterality: Right;  . IR ANGIOGRAM FOLLOW UP STUDY  09/09/2017  . LEFT HEART CATHETERIZATION WITH CORONARY ANGIOGRAM N/A 11/09/2014   Procedure: LEFT HEART  CATHETERIZATION WITH CORONARY ANGIOGRAM;  Surgeon: Laverda Page, MD;  Location: [facility] CATH LAB;  Service: Cardiovascular;  Laterality: N/A;  . LOWER EXTREMITY ANGIOGRAM Right 11/08/2015   Procedure: Lower Extremity Angiogram;  Surgeon: Serafina Mitchell, MD;  Location: [place] [facility];  Service: Cardiovascular;  Laterality: Right;  . LUMBAR LAMINECTOMY  06/13/2012   Procedure: MICRODISCECTOMY LUMBAR LAMINECTOMY;  Surgeon: Jessy Oto, MD;  Location: [place] ;  Service: Orthopedics;  Laterality: N/A;  Central laminectomy L2-3, L3-4, L4-5  . PACEMAKER INSERTION  10/08/14   MDT Advisa MRI compatible dual chamber pacemaker implanted by Dr Caryl Comes for syncope with post-termination pauses  . PERCUTANEOUS CORONARY STENT INTERVENTION (PCI-S)  11/09/2014   des to lad & distal circumflex         Dr  [name]  . PERIPHERAL VASCULAR BALLOON ANGIOPLASTY Right 11/11/2019   Procedure: PERIPHERAL VASCULAR BALLOON ANGIOPLASTY;  Surgeon: Marty Heck, MD;  Location: [place] [facility];  Service: Cardiovascular;  Laterality: Right;  below knee popliteal, tibioperoneal trunk, posterior tibial arteries  . PERIPHERAL VASCULAR CATHETERIZATION N/A 11/08/2015   Procedure: Abdominal Aortogram;  Surgeon: Serafina Mitchell, MD;  Location: [place] [facility];  Service: Cardiovascular;  Laterality: N/A;  . PERIPHERAL VASCULAR CATHETERIZATION Right 11/08/2015   Procedure: Peripheral Vascular Atherectomy;  Surgeon: Serafina Mitchell, MD;  Location: [place] [facility];  Service: Cardiovascular;  Laterality:  Right;  Marland Kitchen PERIPHERAL VASCULAR CATHETERIZATION N/A 10/10/2016   Procedure: Lower Extremity Angiography;  Surgeon: Waynetta Sandy, MD;  Location: [place] [facility];  Service: Cardiovascular;  Laterality: N/A;  . PERIPHERAL VASCULAR CATHETERIZATION Right 10/10/2016   Procedure: Peripheral Vascular Atherectomy;  Surgeon: Waynetta Sandy, MD;  Location: [place] [facility];  Service: Cardiovascular;  Laterality:  Right;  Popliteal  . PERMANENT PACEMAKER INSERTION N/A 10/08/2014   Procedure: PERMANENT PACEMAKER INSERTION;  Surgeon: Deboraha Sprang, MD;  Location: [facility] CATH LAB;  Service: Cardiovascular;  Laterality: N/A;  . TEMPORARY PACEMAKER INSERTION Bilateral 10/07/2014   Procedure: TEMPORARY PACEMAKER INSERTION;  Surgeon: Laverda Page, MD;  Location: [facility] C[facility] LAB;  Service: Cardiovascular;  Laterality: Bilateral;  . WOUND DEBRIDEMENT Right 02/19/2020   Procedure: DEBRIDEMENT WOUND RIGHT FOOT;  Surgeon: Angelia Mould, MD;  Location: [place];  Service: Vascular;  Laterality: Right;    Allergies: Keflex [cephalexin], Pletal [cilostazol], and Sulfa antibiotics  Medications: Prior to Admission medications   Medication Sig Start Date End Date Taking? Authorizing Provider  apixaban (ELIQUIS) 5 MG TABS tablet Take 1 tablet (5 mg total) by mouth 2 (two) times daily. DO NOT TAKE THIS MEDICATION UNTIL WE RESTART IT AFTER YOUR SURGERY. 02/16/20  Yes Rhyne, Hulen Shouts, PA-C  aspirin 81 MG EC tablet Take 81 mg by mouth daily.    Yes [provider]  atorvastatin (LIPITOR) 20 MG tablet Take 1 tablet by mouth once daily Patient taking differently: Take 20 mg by mouth daily.  12/24/19  Yes Miquel Dunn, NP  furosemide (LASIX) 40 MG tablet Take 40 mg by mouth 2 (two) times daily with a meal.    Yes [provider]  hydrALAZINE (APRESOLINE) 100 MG tablet TAKE 1 TABLET BY MOUTH THREE TIMES DAILY Patient taking differently: Take 100 mg by mouth 2 (two) times daily.  02/24/20  Yes Adrian Prows, MD  insulin NPH-regular Human (70-30) 100 UNIT/ML injection Inject 46-56 Units into the skin daily with breakfast.   Yes [provider]  lisinopril (ZESTRIL) 10 MG tablet Take 1 tablet (10 mg total) by mouth daily. 02/16/20  Yes Rhyne, Samantha J, PA-C  metoprolol tartrate (LOPRESSOR) 100 MG tablet Take 1 tablet by mouth twice daily 05/13/20  Yes Tolia, Sunit, DO  prazosin (MINIPRESS) 2 MG  capsule TAKE 1 CAPSULE BY MOUTH IN THE EVENING AFTER SUPPER Patient taking differently: Take 2 mg by mouth daily after supper.  02/24/20  Yes Adrian Prows, MD  ACCU-CHEK FASTCLIX LANCETS MISC Check blood sugar TID & QHS 10/01/14   Lorayne Marek, MD  blood glucose meter kit and supplies KIT Check blood sugar TID & QHS 02/10/15   Advani, Vernon Prey, MD  Blood Glucose Monitoring Suppl (ACCU-CHEK ADVANTAGE DIABETES) kit Use as instructed 10/01/14   Lorayne Marek, MD  glucose blood (ACCU-CHEK AVIVA PLUS) test strip Use as instructed 10/01/14   Advani, Vernon Prey, MD  glucose blood (CHOICE DM FORA G20 TEST STRIPS) test strip Check blood sugar TID & QHS 02/10/15   Advani, Vernon Prey, MD  glucose blood test strip Use as instructed 11/20/13   Reyne Dumas, MD     Family History  Problem Relation Age of Onset  . Diabetes type II Mother   . Hypertension Mother   . Diabetes Mother   . Liver cancer Father   . Diabetes type II Sister   . Breast cancer Sister   . Diabetes Sister   . Hypertension Sister   . Diabetes type II  Brother   . Kidney failure Brother   . Diabetes Brother   . Hypertension Brother   . Diabetes type II Sister     Social History   Socioeconomic History  . Marital status: Married    Spouse name: Not on file  . Number of children: 2  . Years of education: Not on file  . Highest education level: Not on file  Occupational History  . Occupation: Disabled  Tobacco Use  . Smoking status: Former Smoker    Packs/day: 1.50    Years: 30.00    Pack years: 45.00    Types: Cigars    Quit date: 10/07/2014    Years since quitting: 5.6  . Smokeless tobacco: Never Used  Vaping Use  . Vaping Use: Never used  Substance and Sexual Activity  . Alcohol use: Yes    Alcohol/week: 6.0 standard drinks    Types: 6 Cans of beer per week    Comment: 2-3 beers daily  . Drug use: No  . Sexual activity: Not on file  Other Topics Concern  . Not on file  Social History Narrative  . Not on file   Social  Determinants of Health   Financial Resource Strain:   . Difficulty of Paying Living Expenses:   Food Insecurity:   . Worried About Charity fundraiser in the Last Year:   . Arboriculturist in the Last Year:   Transportation Needs:   . Film/video editor (Medical):   Marland Kitchen Lack of Transportation (Non-Medical):   Physical Activity:   . Days of Exercise per Week:   . Minutes of Exercise per Session:   Stress:   . Feeling of Stress :   Social Connections:   . Frequency of Communication with Friends and Family:   . Frequency of Social Gatherings with Friends and Family:   . Attends Religious Services:   . Active Member of Clubs or Organizations:   . Attends Archivist Meetings:   Marland Kitchen Marital Status:      Review of Systems: A 12 point ROS discussed and pertinent positives are indicated in the HPI above.  All other systems are negative.  Review of Systems  Constitutional: Negative for chills and fever.  Respiratory: Negative for shortness of breath and wheezing.   Cardiovascular: Negative for chest pain and palpitations.  Gastrointestinal: Negative for abdominal pain.  Neurological: Negative for headaches.  Psychiatric/Behavioral: Negative for behavioral problems and confusion.    Vital Signs: BP (!) 185/74 (BP Location: Left Arm)   Pulse 72   Temp 98 F (36.7 C) (Oral)   Resp (!) 21   Ht 6' (1.829 m)   Wt 269 lb 2.9 oz (122.1 kg)   SpO2 97%   BMI 36.51 kg/m   Physical Exam Vitals and nursing note reviewed.  Constitutional:      General: He is not in acute distress.    Appearance: Normal appearance.  Cardiovascular:     Rate and Rhythm: Normal rate and regular rhythm.     Heart sounds: Normal heart sounds. No murmur heard.   Pulmonary:     Effort: Pulmonary effort is normal. No respiratory distress.     Breath sounds: Normal breath sounds. No wheezing.  Musculoskeletal:     Comments: (+) RLE BKA.  Skin:    General: Skin is warm and dry.  Neurological:       Mental Status: He is alert and oriented to person, place, and time.  MD Evaluation Airway: WNL Heart: WNL Abdomen: WNL Chest/ Lungs: WNL ASA  Classification: 3 Mallampati/Airway Score: Two   Imaging: US RENAL  Result Date: 05/23/2020 CLINICAL DATA:  Initial evaluation for acute renal insufficiency. EXAM: RENAL / URINARY TRACT ULTRASOUND COMPLETE COMPARISON:  None. FINDINGS: Right Kidney: Renal measurements: 13.1 x 6.4 x 8.7 cm = volume: 370.2 mL. Diffusely increased echogenicity within the renal parenchyma with mild diffuse cortical thinning. No nephrolithiasis or hydronephrosis. No focal renal mass. Left Kidney: Renal measurements: 13.7 x 7.1 x 7.1 cm = volume: 361.6 mL. Increased echogenicity within the renal parenchyma with mild diffuse cortical thinning. No nephrolithiasis or hydronephrosis. No focal renal mass. Bladder: Appears normal for degree of bladder distention. Other: None. IMPRESSION: 1. Diffusely increased echogenicity within the renal parenchyma with underlying mild diffuse cortical thinning, consistent with chronic medical renal disease. 2. No hydronephrosis or other significant finding. Electronically Signed   By: Jeannine Boga M.D.   On: 05/23/2020 21:30   DG CHEST PORT 1 VIEW  Result Date: 05/24/2020 CLINICAL DATA:  Atrial fibrillation. EXAM: PORTABLE CHEST 1 VIEW COMPARISON:  October 09, 2014 FINDINGS: Lungs are clear. Heart is borderline enlarged with pacemaker leads attached to right atrium and right ventricle. Pulmonary vascularity is normal. No adenopathy. No bone lesions. No pneumothorax. IMPRESSION: Heart borderline enlarged. Pacemaker leads attached to right atrium and right ventricle. Lungs clear. Electronically Signed   By: Lowella Grip III M.D.   On: 05/24/2020 10:12   ECHOCARDIOGRAM COMPLETE  Result Date: 05/24/2020    ECHOCARDIOGRAM REPORT   Patient Name:   JENNIE BOLAR [facility] Date of Exam: 05/24/2020 Medical Rec #:  [id number]     Height:        72.0 in Accession #:    [id number]    Weight:       270.0 lb Date of Birth:  Aug 05, 1961     BSA:          2.420 m Patient Age:    34 years      BP:           190/75 mmHg Patient Gender: M             HR:           66 bpm. Exam Location:  Inpatient Procedure: 2D Echo, Color Doppler, Cardiac Doppler and Intracardiac            Opacification Agent Indications:    R60.0 Lower extremity edema  History:        Patient has prior history of Echocardiogram examinations, most                 recent 10/08/2014. CAD, Pacemaker, Arrythmias:Atrial                 Fibrillation; Risk Factors:Hypertension and Diabetes.  Sonographer:    Raquel Sarna Senior RDCS Referring Phys: [id number] [place]  1. Left ventricular ejection fraction, by estimation, is 60 to 65%. The left ventricle has normal function. The left ventricle has no regional wall motion abnormalities. There is severe concentric left ventricular hypertrophy. Left ventricular diastolic  parameters are consistent with Grade II diastolic dysfunction (pseudonormalization).  2. Right ventricular systolic function is normal. The right ventricular size is normal. There is mildly elevated pulmonary artery systolic pressure.  3. Left atrial size was moderately dilated.  4. Right atrial size was moderately dilated.  5. The mitral valve is normal in structure. Mild mitral valve regurgitation. No evidence of mitral stenosis.  6.  The aortic valve is tricuspid. Aortic valve regurgitation is not visualized. No aortic stenosis is present.  7. The inferior vena cava is normal in size with greater than 50% respiratory variability, suggesting right atrial pressure of 3 mmHg. Conclusion(s)/Recommendation(s): Normal biventricular function without evidence of hemodynamically significant valvular heart disease. FINDINGS  Left Ventricle: Left ventricular ejection fraction, by estimation, is 60 to 65%. The left ventricle has normal function. The left ventricle has no regional wall motion  abnormalities. Definity contrast agent was given IV to delineate the left ventricular  endocardial borders. The left ventricular internal cavity size was normal in size. There is severe concentric left ventricular hypertrophy. Left ventricular diastolic parameters are consistent with Grade II diastolic dysfunction (pseudonormalization). Right Ventricle: The right ventricular size is normal. No increase in right ventricular wall thickness. Right ventricular systolic function is normal. There is mildly elevated pulmonary artery systolic pressure. The tricuspid regurgitant velocity is 3.03  m/s, and with an assumed right atrial pressure of 3 mmHg, the estimated right ventricular systolic pressure is 85.6 mmHg. Left Atrium: Left atrial size was moderately dilated. Right Atrium: Right atrial size was moderately dilated. Pericardium: There is no evidence of pericardial effusion. Mitral Valve: The mitral valve is normal in structure. Mild mitral valve regurgitation. No evidence of mitral valve stenosis. Tricuspid Valve: The tricuspid valve is normal in structure. Tricuspid valve regurgitation is mild . No evidence of tricuspid stenosis. Aortic Valve: The aortic valve is tricuspid. Aortic valve regurgitation is not visualized. No aortic stenosis is present. Pulmonic Valve: The pulmonic valve was grossly normal. Pulmonic valve regurgitation is trivial. No evidence of pulmonic stenosis. Aorta: The aortic root, ascending aorta and aortic arch are all structurally normal, with no evidence of dilitation or obstruction. Venous: The inferior vena cava is normal in size with greater than 50% respiratory variability, suggesting right atrial pressure of 3 mmHg. IAS/Shunts: The atrial septum is grossly normal. Additional Comments: A pacer wire is visualized in the right atrium and right ventricle.  LEFT VENTRICLE PLAX 2D LVIDd:         4.96 cm  Diastology LVIDs:         2.63 cm  LV e' lateral:   7.83 cm/s LV PW:         1.62 cm  LV  E/e' lateral: 15.7 LV IVS:        2.28 cm  LV e' medial:    6.53 cm/s LVOT diam:     2.30 cm  LV E/e' medial:  18.8 LV SV:         116 LV SV Index:   48 LVOT Area:     4.15 cm  RIGHT VENTRICLE RV S prime:     15.20 cm/s TAPSE (M-mode): 2.5 cm LEFT ATRIUM              Index       RIGHT ATRIUM           Index LA diam:        3.60 cm  1.49 cm/m  RA Area:     24.60 cm LA Vol (A2C):   142.0 ml 58.67 ml/m RA Volume:   80.60 ml  33.30 ml/m LA Vol (A4C):   98.9 ml  40.86 ml/m LA Biplane Vol: 123.0 ml 50.82 ml/m  AORTIC VALVE LVOT Vmax:   113.00 cm/s LVOT Vmean:  75.900 cm/s LVOT VTI:    0.280 m  AORTA Ao Root diam: 3.70 cm Ao Asc diam:  3.40 cm MITRAL  VALVE                TRICUSPID VALVE MV Area (PHT): 4.80 cm     TR Peak grad:   36.7 mmHg MV Decel Time: 158 msec     TR Vmax:        303.00 cm/s MV E velocity: 123.00 cm/s MV A velocity: 84.40 cm/s   SHUNTS MV E/A ratio:  1.46         Systemic VTI:  0.28 m                             Systemic Diam: 2.30 cm Buford Dresser MD Electronically signed by Buford Dresser MD Signature Date/Time: 05/24/2020/5:35:57 PM    Final    IR KIDNEY BIOPSY  Result Date: 05/26/2020 INDICATION: Proteinuria and worsening renal function. Please perform ultrasound-guided renal biopsy for tissue diagnostic purposes. EXAM: ULTRASOUND GUIDED RENAL BIOPSY COMPARISON:  Renal ultrasound-05/23/2020 MEDICATIONS: None. ANESTHESIA/SEDATION: Fentanyl 100 mcg IV; Versed 2 mg IV Total Moderate Sedation time: 10 minutes; The patient was continuously monitored during the procedure by the interventional radiology nurse under my direct supervision. COMPLICATIONS: None immediate. PROCEDURE: Informed written consent was obtained from the patient after a discussion of the risks, benefits and alternatives to treatment. The patient understands and consents the procedure. A timeout was performed prior to the initiation of the procedure. Ultrasound scanning was performed of the bilateral flanks. The  inferior pole of the right kidney was selected for biopsy due to location and sonographic window. The procedure was planned. The operative site was prepped and draped in the usual sterile fashion. The overlying soft tissues were anesthetized with 1% lidocaine with epinephrine. A 17 gauge core needle biopsy device was advanced into the inferior cortex of the right kidney and 2 core biopsies were obtained under direct ultrasound guidance. Images were saved for documentation purposes. The biopsy device was removed and hemostasis was obtained with manual compression. Post procedural scanning was negative for significant post procedural hemorrhage or additional complication. A dressing was placed. The patient tolerated the procedure well without immediate post procedural complication. IMPRESSION: Technically successful ultrasound guided right renal biopsy. Electronically Signed   By: Sandi Mariscal M.D.   On: 05/26/2020 12:09    Labs:  CBC: Recent Labs    05/27/20 0529 05/28/20 0607 05/29/20 0629 05/30/20 0558  WBC 5.5 5.6 7.7 7.8  HGB 10.3* 9.9* 9.9* 10.3*  HCT 32.7* 29.7* 30.4* 31.2*  PLT 151 165 174 142*    COAGS: Recent Labs    01/26/20 0811 03/11/20 1112 05/26/20 0851  INR 1.2 1.7* 1.2    BMP: Recent Labs    05/27/20 0529 05/28/20 0607 05/29/20 0629 05/30/20 0827  NA 135 133* 137 137  K 5.2* 4.9 3.9 4.0  CL 109 104 102 102  CO2 11* 14* 21* 19*  GLUCOSE 246* 257* 176* 129*  BUN 121* 137* 129* 158*  CALCIUM 8.4* 8.2* 7.9* 7.9*  CREATININE 8.23* 8.23* 8.67* 8.64*  GFRNONAA 6* 6* 6* 6*  GFRAA 7* 7* 7* 7*    LIVER FUNCTION TESTS: Recent Labs    05/24/20 0507 05/24/20 0507 05/25/20 0513 05/25/20 0513 05/25/20 1308 05/25/20 1308 05/26/20 0533 05/26/20 0533 05/27/20 0529 05/28/20 0607 05/29/20 0629 05/30/20 0827  BILITOT 0.6  --  0.5  --  0.5  --  0.5  --   --   --   --   --   AST 14*  --  13*  --  11*  --  19  --   --   --   --   --   ALT 19  --  16  --  18  --   23  --   --   --   --   --   ALKPHOS 90  --  90  --  96  --  87  --   --   --   --   --   PROT 6.4*  --  6.6  --  6.6  --  6.6  --   --   --   --   --   ALBUMIN 3.0*   < > 3.3*   < > 3.5   < > 3.0*   < > 3.4* 3.1* 3.2* 3.1*   < > = values in this interval not displayed.     Assessment and Plan:  AKI in need of HD. Plan for image-guided tunneled HD catheter placement tentatively for 05/31/2020 in IR pending scheduling. Patient will be NPO at midnight. Afebrile and WBCs WNL. INR 1.2 05/26/2020.  Risks and benefits discussed with the patient including, but not limited to bleeding, infection, vascular injury, pneumothorax which may require chest tube placement, air embolism or even death. All of the patient's questions were answered, patient is agreeable to proceed. Consent signed and in IR control room.   Thank you for this interesting consult.  I greatly enjoyed meeting Alexander [name] and look forward to participating in their care.  A copy of this report was sent to the requesting provider on this date.  Electronically Signed: Earley Abide, PA-C 05/30/2020, 3:04 PM   I spent a total of 40 Minutes in face to face in clinical consultation, greater than 50% of which was counseling/coordinating care for AKI/tunneled HD catheter placement.

## 2020-05-30 NOTE — Progress Notes (Addendum)
NEW ADMISSION NOTE  Arrival Method: Bed Mental Orientation: Alert and orientedx4 Telemetry: 16 Assessment: Completed Skin: , lower right back, Band-Aid from previous kidney biopsy IV: right forearm Pain: 0 Tubes: 0 Safety Measures: Safety Fall Prevention Plan has been given, discussed and signed Admission: Completed [facility] Orientation: Patient has been orientated to the room, unit and staff.  Family: 0  Orders have been reviewed and implemented. Will continue to monitor the patient. Call light has been placed within reach and bed alarm has been activated.   Beatris Ship, RN

## 2020-05-30 NOTE — Progress Notes (Signed)
PROGRESS NOTE    Alexander [name]  ZOX:[id number] DOB: 1961/01/16 DOA: 05/23/2020 PCP: [name], MD   Brief Narrative: 59 year old with past medical history significant for A. fib on Eliquis, recent right TKA/daily, CAD status post PTCA 2015, PVD, CKD stage IV baseline creatinine 2.5--3 followed by Dr. Justin Mend who presents to the ED for evaluation of black tarry stool for 2 days.  Denies taking any iron or Pepto-Bismol.  Patient has had a prior endoscopy and colonoscopy 2015 that was unremarkable.  Patient presents with GI bleed and found to have AKI on CKD.  Underwent endoscopy 7/13 2021, colonoscopy 05/25/2020 and IR guided renal biopsy on 05/26/2020.  Renal function progressively continued to worsen and nephrology following for evaluation for hemodialysis.     Assessment & Plan:   Principal Problem:   GI bleeding Active Problems:   PAD (peripheral artery disease) (HCC)   Acute renal failure (HCC)   CAD (coronary artery disease), native coronary artery   Essential hypertension, benign   Acute hematogenous osteomyelitis of right foot (HCC)   Acute GI bleeding   Occult blood in stools   Melena  1-GI, Duodenitis;  Underwent endoscopy and colonoscopy.  Endoscopy showed duodenitis.  Likely a potential source for GI bleed. Colonoscopy showed no acute bleeding. Continue with PPI twice daily. Okay to resume anticoagulation per GI  2-AKI on stage IV CKD: Baseline creatinine 2.5--3 due to diabetic nephropathy. BUN elevation in part due to GI bleed. Patient received high-dose IV steroid, now a steroid has been tapering off Continue with bicarb Underwent renal biopsy Nephrology planning to start HD tomorrow after access placement.   Hyperkalemia: recieved lokelma.   Metabolic acidosis, normal anion gap; Continue with  sodium bicarb.   CAD status post PTCA 2015, PVD, hyperlipidemia, status post PPM; Treated with metoprolol, Lipitor, hydralazine  Uncontrolled  hypertension/hypertension urgency Norvasc increase today, continue with metoprolol, hydralazine.   PAF; Continue with metoprolol/  Holding eliquis, plan to place tunneled catheter tomorrow.   PAD s/p BKA 4/30 With dressing.  Wound care consult.   Type 2 diabetes uncontrolled hyperglycemia: Hemoglobin A1c 8.6 Continue with Lantus twice daily. Continue with a sliding scale insulin  Thrombocytopenia: Mild. Monitor  Morbid obesity         Estimated body mass index is 36.51 kg/m as calculated from the following:   Height as of this encounter: 6' (1.829 m).   Weight as of this encounter: 122.1 kg.   DVT prophylaxis: Heparin Code Status: Full code Family Communication: Care discussed with patient Disposition Plan:  Status is: Inpatient  Remains inpatient appropriate because:Persistent severe electrolyte disturbances and Ongoing active pain requiring inpatient pain management   Dispo:  Patient From: Home  Planned Disposition: Home  Expected discharge date: 05/31/20  Medically stable for discharge: No         Consultants:   Nephrology  Procedures:   EGD 05/24/2020:  Impression: - Normal esophagus. - Normal stomach. - Duodenitis. - Normal third portion of the duodenum. - No specimens collected. Recommendation:- Return patient to hospital ward for ongoing care. - Clear liquid diet today. - Continue present medications including  pantoprazole 40 mg po bid. - Duodenitis is a potential source for blood loss.  Recommend performing a colonoscopy tomorrow and  will discuss with patient when he recovers from  sedation.   Antimicrobials:    Subjective: Patient is  alert, oriented x3.  He is feeling well.  He denies nausea vomiting.  He has some brown dark stool last  black.  Objective: Vitals:   05/29/20 1237 05/29/20 2024 05/30/20 0414 05/30/20 0915  BP: (!) 160/63 (!) 180/64 (!) 166/69 (!) 185/74  Pulse: 62 66 67 72  Resp: 16 18 18  (!) 21  Temp: 97.9 F (36.6 C) 98 F (36.7 C) 97.9 F (36.6 C) 98 F (36.7 C)  TempSrc: Oral Oral Oral Oral  SpO2: 98% 95% 97% 97%  Weight:      Height:        Intake/Output Summary (Last 24 hours) at 05/30/2020 1026 Last data filed at 05/30/2020 0850 Gross per 24 hour  Intake 640 ml  Output 1100 ml  Net -460 ml   Filed Weights   05/23/20 1251 05/28/20 0503 05/29/20 0417  Weight: 122.5 kg 120.5 kg 122.1 kg    Examination:  General exam: Appears calm and comfortable  Respiratory system: Clear to auscultation. Respiratory effort normal. Cardiovascular system: S1 & S2 heard, RRR. No JVD, murmurs, rubs, gallops or clicks. No pedal edema. Gastrointestinal system: Abdomen is nondistended, soft and nontender. No organomegaly or masses felt. Normal bowel sounds heard. Central nervous system: Alert and oriented. No focal neurological deficits. Extremities: Right lower extremity status post BKA dressing in place.  Left lower extremity status post transmetatarsal amputation.    Data Reviewed: I have personally reviewed following labs and imaging studies  CBC: Recent Labs  Lab 05/26/20 0533 05/27/20 0529 05/28/20 0607 05/29/20 0629 05/30/20 0558  WBC 3.5* 5.5 5.6 7.7 7.8  HGB 10.1* 10.3* 9.9* 9.9* 10.3*  HCT 31.8* 32.7* 29.7* 30.4* 31.2*  MCV 90.1 91.1 87.6 88.4 86.9  PLT 126* 151 165 174 224*   Basic Metabolic Panel: Recent Labs  Lab 05/25/20 1308 05/25/20 1308 05/26/20 0533 05/27/20 0529 05/28/20 0607 05/29/20 0629 05/30/20 0827  NA 141   < > 137 135 133* 137 137  K 5.6*   < > 5.7* 5.2* 4.9 3.9 4.0  CL 110   < > 111 109 104 102 102  CO2 16*   < > 14* 11* 14* 21* 19*  GLUCOSE 118*   < >  193* 246* 257* 176* 129*  BUN 112*   < > 110* 121* 137* 129* 158*  CREATININE 7.10*   < > 7.01* 8.23* 8.23* 8.67* 8.64*  CALCIUM 8.4*   < > 8.3* 8.4* 8.2* 7.9* 7.9*  MG 1.8  --   --   --   --   --   --   PHOS 8.9*  --   --  9.3* 9.1* 9.9* 10.5*   < > = values in this interval not displayed.   GFR: Estimated Creatinine Clearance: 12.1 mL/min (A) (by C-G formula based on SCr of 8.64 mg/dL (H)). Liver Function Tests: Recent Labs  Lab 05/23/20 1319 05/23/20 1319 05/24/20 0507 05/24/20 0507 05/25/20 0513 05/25/20 0513 05/25/20 1308 05/25/20 1308 05/26/20 0533 05/27/20 0529 05/28/20 0607 05/29/20 0629 05/30/20 0827  AST 14*  --  14*  --  13*  --  11*  --  19  --   --   --   --   ALT 20  --  19  --  16  --  18  --  23  --   --   --   --   ALKPHOS 105  --  90  --  90  --  96  --  87  --   --   --   --   BILITOT 0.3  --  0.6  --  0.5  --  0.5  --  0.5  --   --   --   --   PROT 6.9  --  6.4*  --  6.6  --  6.6  --  6.6  --   --   --   --   ALBUMIN 3.5   < > 3.0*   < > 3.3*   < > 3.5   < > 3.0* 3.4* 3.1* 3.2* 3.1*   < > = values in this interval not displayed.   No results for input(s): LIPASE, AMYLASE in the last 168 hours. No results for input(s): AMMONIA in the last 168 hours. Coagulation Profile: Recent Labs  Lab 05/26/20 0851  INR 1.2   Cardiac Enzymes: No results for input(s): CKTOTAL, CKMB, CKMBINDEX, TROPONINI in the last 168 hours. BNP (last 3 results) No results for input(s): PROBNP in the last 8760 hours. HbA1C: No results for input(s): HGBA1C in the last 72 hours. CBG: Recent Labs  Lab 05/29/20 1705 05/29/20 2022 05/29/20 2355 05/30/20 0412 05/30/20 0748  GLUCAP 230* 241* 219* 150* 115*   Lipid Profile: No results for input(s): CHOL, HDL, LDLCALC, TRIG, CHOLHDL, LDLDIRECT in the last 72 hours. Thyroid Function Tests: No results for input(s): TSH, T4TOTAL, FREET4, T3FREE, THYROIDAB in the last 72 hours. Anemia Panel: No results for input(s): VITAMINB12,  FOLATE, FERRITIN, TIBC, IRON, RETICCTPCT in the last 72 hours. Sepsis Labs: No results for input(s): PROCALCITON, LATICACIDVEN in the last 168 hours.  Recent Results (from the past 240 hour(s))  SARS Coronavirus 2 by RT PCR (hospital order, performed in [facility] hospital lab) Nasopharyngeal Nasopharyngeal Swab     Status: None   Collection Time: 05/23/20  4:00 PM   Specimen: Nasopharyngeal Swab  Result Value Ref Range Status   SARS Coronavirus 2 NEGATIVE NEGATIVE Final    Comment: (NOTE) SARS-CoV-2 target nucleic acids are NOT DETECTED.  The SARS-CoV-2 RNA is generally detectable in upper and lower respiratory specimens during the acute phase of infection. The lowest concentration of SARS-CoV-2 viral copies this assay can detect is 250 copies / mL. A negative result does not preclude SARS-CoV-2 infection and should not be used as the sole basis for treatment or other patient management decisions.  A negative result may occur with improper specimen collection / handling, submission of specimen other than nasopharyngeal swab, presence of viral mutation(s) within the areas targeted by this assay, and inadequate number of viral copies (<250 copies / mL). A negative result must be combined with clinical observations, patient history, and epidemiological information.  Fact Sheet for Patients:   StrictlyIdeas.no  Fact Sheet for Healthcare Providers: BankingDealers.co.za  This test is not yet approved or  cleared by the [place] FDA and has been authorized for detection and/or diagnosis of SARS-CoV-2 by FDA under an Emergency Use Authorization (EUA).  This EUA will remain in effect (meaning this test can be used) for the duration of the COVID-19 declaration under Section 564(b)(1) of the Act, 21 U.S.C. section 360bbb-3(b)(1), unless the authorization is terminated or revoked sooner.  Performed at [facility], [place] [address] [place],  [address]          Radiology Studies: No results found.      Scheduled Meds: . amLODipine  5 mg Oral Daily  . atorvastatin  20 mg Oral Daily  . folic acid  1 mg Oral Daily  . heparin injection (subcutaneous)  5,000 Units Subcutaneous Q8H  . hydrALAZINE  100 mg Oral TID  .  insulin aspart  0-15 Units Subcutaneous Q4H  . insulin glargine  8 Units Subcutaneous BID  . metoprolol tartrate  100 mg Oral BID  . multivitamin with minerals  1 tablet Oral Daily  . pantoprazole  40 mg Oral BID  . prazosin  2 mg Oral QPC supper  . predniSONE  60 mg Oral Q breakfast  . sodium bicarbonate  650 mg Oral TID  . sodium zirconium cyclosilicate  10 g Oral BID  . thiamine  100 mg Oral Daily   Or  . thiamine  100 mg Intravenous Daily   Continuous Infusions:   LOS: 7 days    Time spent: 35 minutes.     Elmarie Shiley, MD [facility]   If 7PM-7AM, please contact night-coverage [email or web address]  05/30/2020, 10:26 AM

## 2020-05-30 NOTE — Progress Notes (Signed)
Attempted to call receiving RN at [facility] but she was unable to take report at this time, call back info given as well as making her aware that [facility] was on unit to pick up patient for transport

## 2020-05-31 ENCOUNTER — Inpatient Hospital Stay (HOSPITAL_COMMUNITY): Payer: Medicare Other

## 2020-05-31 HISTORY — PX: IR FLUORO GUIDE CV LINE RIGHT: IMG2283

## 2020-05-31 LAB — KAPPA/LAMBDA LIGHT CHAINS
Kappa free light chain: 125.9 mg/L — ABNORMAL HIGH (ref 3.3–19.4)
Kappa, lambda light chain ratio: 1.64 (ref 0.26–1.65)
Lambda free light chains: 76.6 mg/L — ABNORMAL HIGH (ref 5.7–26.3)

## 2020-05-31 LAB — RENAL FUNCTION PANEL
Albumin: 2.7 g/dL — ABNORMAL LOW (ref 3.5–5.0)
Anion gap: 17 — ABNORMAL HIGH (ref 5–15)
BUN: 172 mg/dL — ABNORMAL HIGH (ref 8–23)
CO2: 18 mmol/L — ABNORMAL LOW (ref 22–32)
Calcium: 7.7 mg/dL — ABNORMAL LOW (ref 8.9–10.3)
Chloride: 102 mmol/L (ref 98–111)
Creatinine, Ser: 9.31 mg/dL — ABNORMAL HIGH (ref 0.61–1.24)
GFR calc Af Amer: 6 mL/min — ABNORMAL LOW (ref >60)
GFR calc non Af Amer: 5 mL/min — ABNORMAL LOW (ref >60)
Glucose, Bld: 125 mg/dL — ABNORMAL HIGH (ref 70–99)
Phosphorus: 10 mg/dL — ABNORMAL HIGH (ref 2.5–4.6)
Potassium: 4 mmol/L (ref 3.5–5.1)
Sodium: 137 mmol/L (ref 135–145)

## 2020-05-31 LAB — GLUCOSE, CAPILLARY
Glucose-Capillary: 102 mg/dL — ABNORMAL HIGH (ref 70–99)
Glucose-Capillary: 117 mg/dL — ABNORMAL HIGH (ref 70–99)
Glucose-Capillary: 174 mg/dL — ABNORMAL HIGH (ref 70–99)
Glucose-Capillary: 178 mg/dL — ABNORMAL HIGH (ref 70–99)
Glucose-Capillary: 234 mg/dL — ABNORMAL HIGH (ref 70–99)
Glucose-Capillary: 244 mg/dL — ABNORMAL HIGH (ref 70–99)

## 2020-05-31 LAB — CBC
HCT: 30.5 % — ABNORMAL LOW (ref 39.0–52.0)
Hemoglobin: 10.1 g/dL — ABNORMAL LOW (ref 13.0–17.0)
MCH: 28.9 pg (ref 26.0–34.0)
MCHC: 33.1 g/dL (ref 30.0–36.0)
MCV: 87.1 fL (ref 80.0–100.0)
Platelets: 149 10*3/uL — ABNORMAL LOW (ref 150–400)
RBC: 3.5 MIL/uL — ABNORMAL LOW (ref 4.22–5.81)
RDW: 14.1 % (ref 11.5–15.5)
WBC: 6.7 10*3/uL (ref 4.0–10.5)
nRBC: 0 % (ref 0.0–0.2)

## 2020-05-31 MED ORDER — LIDOCAINE HCL 1 % IJ SOLN
INTRAMUSCULAR | Status: AC | PRN
  Administered 2020-05-31: 10 mL

## 2020-05-31 MED ORDER — HEPARIN SODIUM (PORCINE) 1000 UNIT/ML IJ SOLN
INTRAMUSCULAR | Status: AC | PRN
  Administered 2020-05-31: 3800 [IU] via INTRAVENOUS

## 2020-05-31 MED ORDER — FENTANYL CITRATE (PF) 100 MCG/2ML IJ SOLN
INTRAMUSCULAR | Status: AC
  Filled 2020-05-31: qty 2

## 2020-05-31 MED ORDER — MIDAZOLAM HCL 2 MG/2ML IJ SOLN
INTRAMUSCULAR | Status: AC
  Filled 2020-05-31: qty 2

## 2020-05-31 MED ORDER — VANCOMYCIN HCL IN DEXTROSE 1-5 GM/200ML-% IV SOLN
INTRAVENOUS | Status: AC
  Administered 2020-05-31: 1000 mg via INTRAVENOUS
  Filled 2020-05-31: qty 200

## 2020-05-31 MED ORDER — SEVELAMER CARBONATE 800 MG PO TABS
800.0000 mg | ORAL_TABLET | Freq: Three times a day (TID) | ORAL | Status: DC
  Administered 2020-05-31 – 2020-06-05 (×11): 800 mg via ORAL
  Filled 2020-05-31 (×10): qty 1

## 2020-05-31 MED ORDER — HEPARIN SODIUM (PORCINE) 1000 UNIT/ML IJ SOLN
INTRAMUSCULAR | Status: AC
  Filled 2020-05-31: qty 1

## 2020-05-31 MED ORDER — MIDAZOLAM HCL 2 MG/2ML IJ SOLN
INTRAMUSCULAR | Status: AC | PRN
  Administered 2020-05-31: 1 mg via INTRAVENOUS

## 2020-05-31 MED ORDER — LIDOCAINE HCL 1 % IJ SOLN
INTRAMUSCULAR | Status: AC
  Filled 2020-05-31: qty 20

## 2020-05-31 MED ORDER — VANCOMYCIN HCL IN DEXTROSE 1-5 GM/200ML-% IV SOLN: 1000.0000 mg | Freq: Once | INTRAVENOUS | Status: AC

## 2020-05-31 MED ORDER — CHLORHEXIDINE GLUCONATE CLOTH 2 % EX PADS: 6.0000 | MEDICATED_PAD | Freq: Every day | CUTANEOUS | Status: DC

## 2020-05-31 MED ORDER — APIXABAN 5 MG PO TABS
5.0000 mg | ORAL_TABLET | Freq: Two times a day (BID) | ORAL | Status: DC
  Administered 2020-06-01 – 2020-06-05 (×9): 5 mg via ORAL
  Filled 2020-05-31 (×9): qty 1

## 2020-05-31 MED ORDER — HEPARIN SODIUM (PORCINE) 1000 UNIT/ML IJ SOLN
INTRAMUSCULAR | Status: AC
  Filled 2020-05-31: qty 4

## 2020-05-31 MED ORDER — FENTANYL CITRATE (PF) 100 MCG/2ML IJ SOLN
INTRAMUSCULAR | Status: AC | PRN
  Administered 2020-05-31: 50 ug via INTRAVENOUS

## 2020-05-31 NOTE — Procedures (Signed)
  Procedure: R IJ tunneled HD catheter   EBL:   minimal Complications:  none immediate  See full dictation in [facility].  Dillard Cannon MD Main # [phone] Pager  [phone]

## 2020-05-31 NOTE — Progress Notes (Signed)
PROGRESS NOTE    Alexander [name]  GUR:[id number] DOB: 1961-08-15 DOA: 05/23/2020 PCP: [name], MD   Brief Narrative: 59 year old with past medical history significant for A. fib on Eliquis, recent right TKA/daily, CAD status post PTCA 2015, PVD, CKD stage IV baseline creatinine 2.5--3 followed by Dr. Justin Mend who presents to the ED for evaluation of black tarry stool for 2 days.  Denies taking any iron or Pepto-Bismol.  Patient has had a prior endoscopy and colonoscopy 2015 that was unremarkable.  Patient presents with GI bleed and found to have AKI on CKD.  Underwent endoscopy 7/13 2021, colonoscopy 05/25/2020 and IR guided renal biopsy on 05/26/2020.  Renal function progressively continued to worsen and nephrology following for evaluation for hemodialysis.  Per Nephrology note preliminary biopsy report consistent with diabetic nephropathy with nodular glomerulosclerosis and rare extra capillary proliferation interpreted to present pseudocrescents.  Focal acute tubular injury with intratubular red blood cell casts moderate to severe interstitial fibrosis and tubular atrophy.  Awaiting final biopsy report.   Patient underwent tunneled hemodialysis catheter on 7/20 in anticipation to start hemodialysis.  For his chronic stump wound, [name] was informed of patient admission today. Dr [name] will see patient 7/21.  Assessment & Plan:   Principal Problem:   GI bleeding Active Problems:   PAD (peripheral artery disease) (HCC)   Acute renal failure (HCC)   CAD (coronary artery disease), native coronary artery   Essential hypertension, benign   Acute hematogenous osteomyelitis of right foot (HCC)   Acute GI bleeding   Occult blood in stools   Melena  1-GI, Duodenitis;  Underwent endoscopy and colonoscopy.  Endoscopy showed duodenitis.  Likely a potential source for GI bleed. Colonoscopy showed no acute bleeding. Continue with PPI twice daily. Okay to resume anticoagulation per GI. Hb  remain stable.   2-AKI on stage IV CKD: Baseline creatinine 2.5--3 due to diabetic nephropathy. BUN elevation in part due to GI bleed. Patient received high-dose IV steroid, now a steroid has been tapering off Continue with bicarb Underwent renal biopsy, final report pending.  Nephrology planning to start HD 7/20. Underwent Tunneled HD catheter placement 7/20.  Hyperkalemia: recieved lokelma. Resolved.   Metabolic acidosis, normal anion gap; Continue with  sodium bicarb. Plan to stop sodium bicarb tablet after initiation of HD>   CAD status post PTCA 2015, PVD, hyperlipidemia, status post PPM; Treated with metoprolol, Lipitor, hydralazine  Uncontrolled hypertension/hypertension urgency Norvasc increase today, continue with metoprolol, hydralazine.   PAF; Continue with metoprolol/  Holding eliquis, plan to place tunneled catheter today/  Ok to resume eliquis tomorrow per nephrologist.   PAD s/p BKA 4/30 Wound care consulted. Continue with daily dressing changes.  Dr [name] was informed of patient admission. He will see patient on 7/21.  Type 2 diabetes uncontrolled hyperglycemia: Hemoglobin A1c 8.6 Continue with Lantus twice daily. Continue with a sliding scale insulin  Thrombocytopenia: Mild. Monitor  Morbid obesity         Estimated body mass index is 36.51 kg/m as calculated from the following:   Height as of this encounter: 6' (1.829 m).   Weight as of this encounter: 122.1 kg.   DVT prophylaxis: Heparin Code Status: Full code Family Communication: Care discussed with patient Disposition Plan:  Status is: Inpatient  Remains inpatient appropriate because:Persistent severe electrolyte disturbances and Ongoing active pain requiring inpatient pain management plan to start HD 7-20   Dispo:  Patient From: Home  Planned Disposition: Home  Expected discharge date: To be  determined, will start hemodialysis today.  Medically stable for discharge: No and to  start hemodialysis today.         Consultants:   Nephrology  Procedures:   EGD 05/24/2020:  Impression: - Normal esophagus. - Normal stomach. - Duodenitis. - Normal third portion of the duodenum. - No specimens collected. Recommendation:- Return patient to hospital ward for ongoing care. - Clear liquid diet today. - Continue present medications including  pantoprazole 40 mg po bid. - Duodenitis is a potential source for blood loss.  Recommend performing a colonoscopy tomorrow and  will discuss with patient when he recovers from  sedation.   Antimicrobials:    Subjective; Patient is alert and conversant, he would like to be able to eat.  He is awaiting hemodialysis catheter placement today. He denies abdominal pain.  DeniesObjective: Vitals:   05/31/20 1438 05/31/20 1500 05/31/20 1513 05/31/20 1532  BP: (!) 155/70 (!) 166/73 (!) 166/73 (!) 164/76  Pulse: 61 60 60 63  Resp: 18 18 18 18   Temp: 98.4 F (36.9 C) 98.3 F (36.8 C) 98.3 F (36.8 C) 98.4 F (36.9 C)  TempSrc: Oral Oral Oral Oral  SpO2: 100% 99%    Weight:      Height:        Intake/Output Summary (Last 24 hours) at 05/31/2020 1547 Last data filed at 05/31/2020 1335 Gross per 24 hour  Intake 360 ml  Output 1101 ml  Net -741 ml   Filed Weights   05/23/20 1251 05/28/20 0503 05/29/20 0417  Weight: 122.5 kg 120.5 kg 122.1 kg    Examination:  General exam: NAD Respiratory system: CTA Cardiovascular system: S 1, S 2 RRR Gastrointestinal system: BS present, soft, nt Central nervous system: Non focal.  Extremities: Right lower extremity status post BKA dressing in place.  Left lower extremity status post  transmetatarsal amputation.    Data Reviewed: I have personally reviewed following labs and imaging studies  CBC: Recent Labs  Lab 05/27/20 0529 05/28/20 0607 05/29/20 0629 05/30/20 0558 05/31/20 0751  WBC 5.5 5.6 7.7 7.8 6.7  HGB 10.3* 9.9* 9.9* 10.3* 10.1*  HCT 32.7* 29.7* 30.4* 31.2* 30.5*  MCV 91.1 87.6 88.4 86.9 87.1  PLT 151 165 174 142* 211*   Basic Metabolic Panel: Recent Labs  Lab 05/25/20 1308 05/26/20 0533 05/27/20 0529 05/28/20 0607 05/29/20 0629 05/30/20 0827 05/31/20 0751  NA 141   < > 135 133* 137 137 137  K 5.6*   < > 5.2* 4.9 3.9 4.0 4.0  CL 110   < > 109 104 102 102 102  CO2 16*   < > 11* 14* 21* 19* 18*  GLUCOSE 118*   < > 246* 257* 176* 129* 125*  BUN 112*   < > 121* 137* 129* 158* 172*  CREATININE 7.10*   < > 8.23* 8.23* 8.67* 8.64* 9.31*  CALCIUM 8.4*   < > 8.4* 8.2* 7.9* 7.9* 7.7*  MG 1.8  --   --   --   --   --   --   PHOS 8.9*  --  9.3* 9.1* 9.9* 10.5* 10.0*   < > = values in this interval not displayed.   GFR: Estimated Creatinine Clearance: 11.2 mL/min (A) (by C-G formula based on SCr of 9.31 mg/dL (H)). Liver Function Tests: Recent Labs  Lab 05/25/20 9417 05/25/20 4081 05/25/20 1308 05/25/20 1308 05/26/20 0533 05/26/20 0533 05/27/20 0529 05/28/20 4481 05/29/20 0629 05/30/20 0827 05/31/20 0751  AST 13*  --  11*  --  19  --   --   --   --   --   --   ALT 16  --  18  --  23  --   --   --   --   --   --   ALKPHOS 90  --  96  --  87  --   --   --   --   --   --   BILITOT 0.5  --  0.5  --  0.5  --   --   --   --   --   --   PROT 6.6  --  6.6  --  6.6  --   --   --   --   --   --   ALBUMIN 3.3*   < > 3.5   < > 3.0*   < > 3.4* 3.1* 3.2* 3.1* 2.7*   < > = values in this interval not displayed.   No results for input(s): LIPASE, AMYLASE in the last 168 hours. No results for input(s): AMMONIA in the last 168 hours. Coagulation Profile: Recent Labs  Lab 05/26/20 0851  INR 1.2   Cardiac Enzymes: No results for input(s): CKTOTAL,  CKMB, CKMBINDEX, TROPONINI in the last 168 hours. BNP (last 3 results) No results for input(s): PROBNP in the last 8760 hours. HbA1C: No results for input(s): HGBA1C in the last 72 hours. CBG: Recent Labs  Lab 05/30/20 2046 05/30/20 2338 05/31/20 0428 05/31/20 0740 05/31/20 1127  GLUCAP 261* 234* 178* 117* 102*   Lipid Profile: No results for input(s): CHOL, HDL, LDLCALC, TRIG, CHOLHDL, LDLDIRECT in the last 72 hours. Thyroid Function Tests: No results for input(s): TSH, T4TOTAL, FREET4, T3FREE, THYROIDAB in the last 72 hours. Anemia Panel: No results for input(s): VITAMINB12, FOLATE, FERRITIN, TIBC, IRON, RETICCTPCT in the last 72 hours. Sepsis Labs: No results for input(s): PROCALCITON, LATICACIDVEN in the last 168 hours.  Recent Results (from the past 240 hour(s))  SARS Coronavirus 2 by RT PCR (hospital order, performed in [facility] hospital lab) Nasopharyngeal Nasopharyngeal Swab     Status: None   Collection Time: 05/23/20  4:00 PM   Specimen: Nasopharyngeal Swab  Result Value Ref Range Status   SARS Coronavirus 2 NEGATIVE NEGATIVE Final    Comment: (NOTE) SARS-CoV-2 target nucleic acids are NOT DETECTED.  The SARS-CoV-2 RNA is generally detectable in upper and lower respiratory specimens during the acute phase of infection. The lowest concentration of SARS-CoV-2 viral copies this assay can detect is 250 copies / mL. A negative result does not preclude SARS-CoV-2 infection and should not be used as the sole basis for treatment or other patient management decisions.  A negative result may occur with improper specimen collection / handling, submission of specimen other than nasopharyngeal swab, presence of viral mutation(s) within the areas targeted by this assay, and inadequate number of viral copies (<250 copies / mL). A negative result must be combined with clinical observations, patient history, and epidemiological information.  Fact Sheet for Patients:     StrictlyIdeas.no  Fact Sheet for Healthcare Providers: BankingDealers.co.za  This test is not yet approved or  cleared by the [place] FDA and has been authorized for detection and/or diagnosis of SARS-CoV-2 by FDA under an Emergency Use Authorization (EUA).  This EUA will remain in effect (meaning this test can be used) for the duration of the COVID-19 declaration under Section 564(b)(1) of the Act, 21 U.S.C. section  360bbb-3(b)(1), unless the authorization is terminated or revoked sooner.  Performed at [facility], [place] [address] [place], [place] [address]          Radiology Studies: No results found.      Scheduled Meds: . amLODipine  10 mg Oral Daily  . [START ON 06/01/2020] apixaban  5 mg Oral BID  . atorvastatin  20 mg Oral Daily  . Chlorhexidine Gluconate Cloth  6 each Topical Q0600  . folic acid  1 mg Oral Daily  . heparin sodium (porcine)      . hydrALAZINE  100 mg Oral TID  . insulin aspart  0-15 Units Subcutaneous Q4H  . insulin glargine  8 Units Subcutaneous BID  . lidocaine      . metoprolol tartrate  100 mg Oral BID  . multivitamin with minerals  1 tablet Oral Daily  . pantoprazole  40 mg Oral BID  . prazosin  2 mg Oral QPC supper  . predniSONE  60 mg Oral Q breakfast  . sevelamer carbonate  800 mg Oral TID WC  . sodium bicarbonate  650 mg Oral TID  . thiamine  100 mg Oral Daily   Or  . thiamine  100 mg Intravenous Daily   Continuous Infusions:   LOS: 8 days    Time spent: 35 minutes.     Elmarie Shiley, MD [facility]   If 7PM-7AM, please contact night-coverage [email or web address]  05/31/2020, 3:47 PM

## 2020-05-31 NOTE — Plan of Care (Signed)
  Problem: Activity: Goal: Risk for activity intolerance will decrease Outcome: Progressing   

## 2020-05-31 NOTE — Progress Notes (Signed)
Pt is s/p tunnel cath. Ok to resume apixaban on 7/21 per Dr. Royce Macadamia.   Onnie Boer, PharmD, BCIDP, AAHIVP, CPP Infectious Disease Pharmacist 05/31/2020 2:40 PM

## 2020-05-31 NOTE — Progress Notes (Signed)
Patient ID: DIN BOOKWALTER, male   DOB: [birth year], 59 y.o.   MRN: [id number] [facility] Progress Note   Assessment/ Plan:   1. Acute kidney Injury on chronic kidney disease stage IV: Baseline creatinine ranging 2.5-3.0 and admitted with creatinine of 6.8/potassium 5.8 following which he underwent renal biopsy because of proteinuria and hematuria.  Empirically on corticosteroids, serologies in process and without evidence of plasma cell dyscrasia. Hepatitis nonreactive - Start HD today after access placement - HD on 7/20 and 7/21 as well as 7/22 then assess needs daily   - have Consulted IR for tunneled catheter for hemodialysis for 7/20  - note GBM pending, MPO/PR3 pending, free light chain ratio pending - will need to interpret renal bx results in context of ANCA studies once they are available  - for now on pred 60 mg daily  - Please defer eliquis - placing tunneled catheter  2.  GI bleed/melena: Suspected to be secondary to duodenitis based on EGD/colonoscopy and currently on PPI.  He is hemodynamically stable and without indications for PRBC transfusion.  3.  Hypertension: Increased amlodipine to 10 mg daily.  Lisinopril and furosemide currently on hold.  4.  Peripheral vascular disease status post right BKA with intact dressing-malodorous.  5. Metabolic acidosis - stop bicarbonate once on HD  6. Metabolic bone disease/Hyperphosphatemia - Starting HD and will start renvela for when taking PO. Check intact PTH  7.  Type 2 diabetes mellitus: Insulin-dependent and likely the predominating etiology of his underlying chronic kidney disease.  Subjective:   As documented yesterday, he has consented for HD and plans to start today after access.  Hasn't been placed yet.  He feels well and a little frustrated that labs don't reflect.    Review of systems:  Denies shortness of breath or chest pain  Denies n/v  Denies mental status changes Has been NPO for access placement     Objective:   BP (!) 156/65 (BP Location: Left Arm)   Pulse 60   Temp 98 F (36.7 C) (Oral)   Resp 18   Ht 6' (1.829 m)   Wt 122.1 kg   SpO2 97%   BMI 36.51 kg/m   Intake/Output Summary (Last 24 hours) at 05/31/2020 1002 Last data filed at 05/31/2020 7902 Gross per 24 hour  Intake 710 ml  Output 726 ml  Net -16 ml   Weight change:   Physical Exam:   Gen: adult male in bed in NAD  CVS: S1 and S2 no rub Resp: Clear to auscultation, no rales/rhonchi; unlabored at rest  Abd: Soft, obese, nontender Ext: Trace left leg/ankle edema. Right leg status post BKA pysch normal mood and affect Neuro  Alert and oriented x 3 provides hx and follows commands  Imaging: No results found.  Labs: BMET Recent Labs  Lab 05/25/20 1308 05/26/20 0533 05/27/20 0529 05/28/20 0607 05/29/20 0629 05/30/20 0827 05/31/20 0751  NA 141 137 135 133* 137 137 137  K 5.6* 5.7* 5.2* 4.9 3.9 4.0 4.0  CL 110 111 109 104 102 102 102  CO2 16* 14* 11* 14* 21* 19* 18*  GLUCOSE 118* 193* 246* 257* 176* 129* 125*  BUN 112* 110* 121* 137* 129* 158* 172*  CREATININE 7.10* 7.01* 8.23* 8.23* 8.67* 8.64* 9.31*  CALCIUM 8.4* 8.3* 8.4* 8.2* 7.9* 7.9* 7.7*  PHOS 8.9*  --  9.3* 9.1* 9.9* 10.5* 10.0*   CBC Recent Labs  Lab 05/28/20 0607 05/29/20 0629 05/30/20 0558 05/31/20 0751  WBC  5.6 7.7 7.8 6.7  HGB 9.9* 9.9* 10.3* 10.1*  HCT 29.7* 30.4* 31.2* 30.5*  MCV 87.6 88.4 86.9 87.1  PLT 165 174 142* 149*    Medications:    . amLODipine  10 mg Oral Daily  . atorvastatin  20 mg Oral Daily  . Chlorhexidine Gluconate Cloth  6 each Topical Q0600  . folic acid  1 mg Oral Daily  . hydrALAZINE  100 mg Oral TID  . insulin aspart  0-15 Units Subcutaneous Q4H  . insulin glargine  8 Units Subcutaneous BID  . metoprolol tartrate  100 mg Oral BID  . multivitamin with minerals  1 tablet Oral Daily  . pantoprazole  40 mg Oral BID  . prazosin  2 mg Oral QPC supper  . predniSONE  60 mg Oral Q breakfast  . sodium  bicarbonate  650 mg Oral TID  . thiamine  100 mg Oral Daily   Or  . thiamine  100 mg Intravenous Daily   Claudia Desanctis, MD 05/31/2020, 10:07 AM

## 2020-06-01 ENCOUNTER — Encounter (HOSPITAL_COMMUNITY): Payer: Self-pay

## 2020-06-01 HISTORY — PX: IR US GUIDE VASC ACCESS RIGHT: IMG2390

## 2020-06-01 LAB — ANTINUCLEAR ANTIBODIES, IFA: ANA Ab, IFA: NEGATIVE

## 2020-06-01 LAB — CBC
HCT: 29.7 % — ABNORMAL LOW (ref 39.0–52.0)
Hemoglobin: 9.9 g/dL — ABNORMAL LOW (ref 13.0–17.0)
MCH: 28.7 pg (ref 26.0–34.0)
MCHC: 33.3 g/dL (ref 30.0–36.0)
MCV: 86.1 fL (ref 80.0–100.0)
Platelets: 144 10*3/uL — ABNORMAL LOW (ref 150–400)
RBC: 3.45 MIL/uL — ABNORMAL LOW (ref 4.22–5.81)
RDW: 14.2 % (ref 11.5–15.5)
WBC: 7.1 10*3/uL (ref 4.0–10.5)
nRBC: 0 % (ref 0.0–0.2)

## 2020-06-01 LAB — GLUCOSE, CAPILLARY
Glucose-Capillary: 260 mg/dL — ABNORMAL HIGH (ref 70–99)
Glucose-Capillary: 168 mg/dL — ABNORMAL HIGH (ref 70–99)
Glucose-Capillary: 164 mg/dL — ABNORMAL HIGH (ref 70–99)
Glucose-Capillary: 298 mg/dL — ABNORMAL HIGH (ref 70–99)
Glucose-Capillary: 267 mg/dL — ABNORMAL HIGH (ref 70–99)

## 2020-06-01 LAB — RENAL FUNCTION PANEL
Albumin: 2.5 g/dL — ABNORMAL LOW (ref 3.5–5.0)
Anion gap: 15 (ref 5–15)
BUN: 135 mg/dL — ABNORMAL HIGH (ref 8–23)
CO2: 19 mmol/L — ABNORMAL LOW (ref 22–32)
Calcium: 7.7 mg/dL — ABNORMAL LOW (ref 8.9–10.3)
Chloride: 104 mmol/L (ref 98–111)
Creatinine, Ser: 7.47 mg/dL — ABNORMAL HIGH (ref 0.61–1.24)
GFR calc Af Amer: 8 mL/min — ABNORMAL LOW (ref >60)
GFR calc non Af Amer: 7 mL/min — ABNORMAL LOW (ref >60)
Glucose, Bld: 260 mg/dL — ABNORMAL HIGH (ref 70–99)
Phosphorus: 7.8 mg/dL — ABNORMAL HIGH (ref 2.5–4.6)
Potassium: 3.8 mmol/L (ref 3.5–5.1)
Sodium: 138 mmol/L (ref 135–145)

## 2020-06-01 LAB — GLOMERULAR BASEMENT MEMBRANE ANTIBODIES: GBM Ab: 6 units (ref 0–20)

## 2020-06-01 LAB — MPO/PR-3 (ANCA) ANTIBODIES
ANCA Proteinase 3: 61.8 U/mL — ABNORMAL HIGH (ref 0.0–3.5)
Myeloperoxidase Abs: 9.5 U/mL — ABNORMAL HIGH (ref 0.0–9.0)

## 2020-06-01 MED ORDER — INSULIN GLARGINE 100 UNIT/ML ~~LOC~~ SOLN
12.0000 [IU] | Freq: Two times a day (BID) | SUBCUTANEOUS | Status: DC
  Administered 2020-06-01: 12 [IU] via SUBCUTANEOUS
  Filled 2020-06-01 (×3): qty 0.12

## 2020-06-01 MED ORDER — ALBUMIN HUMAN 25 % IV SOLN
12.5000 g | Freq: Once | INTRAVENOUS | Status: DC
  Filled 2020-06-01: qty 50

## 2020-06-01 MED ORDER — CHLORHEXIDINE GLUCONATE CLOTH 2 % EX PADS
6.0000 | MEDICATED_PAD | Freq: Every day | CUTANEOUS | Status: DC
  Administered 2020-06-02 – 2020-06-03 (×2): 6 via TOPICAL

## 2020-06-01 MED ORDER — HEPARIN SODIUM (PORCINE) 1000 UNIT/ML IJ SOLN
INTRAMUSCULAR | Status: AC
  Administered 2020-06-01: 1000 [IU]
  Filled 2020-06-01: qty 4

## 2020-06-01 NOTE — Progress Notes (Signed)
Inpatient Diabetes Program Recommendations  AACE/ADA: New Consensus Statement on Inpatient Glycemic Control (2015)  Target Ranges:  Prepandial:   less than 140 mg/dL      Peak postprandial:   less than 180 mg/dL (1-2 hours)      Critically ill patients:  140 - 180 mg/dL   Lab Results  Component Value Date   GLUCAP 168 (H) 06/01/2020   HGBA1C 5.5 05/23/2020    Review of Glycemic Control Results for Alexander [name], Alexander [name] (MRN [id number]) as of 06/01/2020 09:22  Ref. Range 05/31/2020 07:40 05/31/2020 11:27 05/31/2020 16:09 05/31/2020 22:13 05/31/2020 23:49 06/01/2020 04:19 06/01/2020 08:16  Glucose-Capillary Latest Ref Range: 70 - 99 mg/dL 117 (H) 102 (H) 174 (H) 234 (H) 244 (H) 260 (H) 168 (H)   Diabetes history: DM 2 Outpatient Diabetes medications: 70/30 insulin 46-56 units at breakfast Current orders for Inpatient glycemic control:  Lantus 8 units bid Novolog 0-15 units Q4 hours  PO prednisone 60 mg Daily  Inpatient Diabetes Program Recommendations:    Glucose trends increase after meal intake. While on current dose of steroids consider:  - Consider Novolog 2 units tid meal coverage with parameters for pt to eat >50% of meals and glucose is at least 100 mg/dl.  - Lower Novolog Correction to "sensitive" due to renal function 0-9 units tid and hs instead of Q4 since pt is eating.  Thanks,  Tama Headings RN, MSN, BC-ADM Inpatient Diabetes Coordinator Team Pager [phone] (8a-5p)

## 2020-06-01 NOTE — Progress Notes (Signed)
PROGRESS NOTE    Alexander [name]  OIN:[id number] DOB: 1961-08-30 DOA: 05/23/2020 PCP: [name], MD   Brief Narrative: 59 year old with past medical history significant for A. fib on Eliquis, recent right TKA/daily, CAD status post PTCA 2015, PVD, CKD stage IV baseline creatinine 2.5--3 followed by Dr. Justin Mend who presents to the ED for evaluation of black tarry stool for 2 days.  Denies taking any iron or Pepto-Bismol.  Patient has had a prior endoscopy and colonoscopy 2015 that was unremarkable.  Patient presents with GI bleed and found to have AKI on CKD.  Underwent endoscopy 7/13 2021, colonoscopy 05/25/2020 and IR guided renal biopsy on 05/26/2020.  Renal function progressively continued to worsen and nephrology following for evaluation for hemodialysis.  Per Nephrology note preliminary biopsy report consistent with diabetic nephropathy with nodular glomerulosclerosis and rare extra capillary proliferation interpreted to present pseudocrescents.  Focal acute tubular injury with intratubular red blood cell casts moderate to severe interstitial fibrosis and tubular atrophy.  Awaiting final biopsy report.   Patient underwent tunneled hemodialysis catheter on 7/20 in anticipation to start hemodialysis.  For his chronic stump wound, [name] was consulted.  Assessment & Plan:   Principal Problem:   GI bleeding Active Problems:   PAD (peripheral artery disease) (HCC)   Acute renal failure (HCC)   CAD (coronary artery disease), native coronary artery   Essential hypertension, benign   Acute hematogenous osteomyelitis of right foot (HCC)   Acute GI bleeding   Occult blood in stools   Melena  1-GI, Duodenitis;  Underwent endoscopy and colonoscopy.  Endoscopy showed duodenitis.  Likely a potential source for GI bleed. Colonoscopy showed no acute bleeding. Continue with PPI twice daily. Okay to resume anticoagulation per GI. Hb remain stable.  Patient has not required any transfusion  during this hospitalization.  2-AKI on stage IV CKD: Baseline creatinine 2.5--3 due to diabetic nephropathy but admitted with creatinine of 6.78. BUN elevation in part due to GI bleed.  Creatinine peaked at 9.31 on 05/31/2020.  Status post renal biopsy 05/26/2020, final report pending.  Had [place] on 05/31/2020 with first HD session. Patient received high-dose IV steroid, now a steroid has been tapering off.  Appreciate nephrology assistance.  Hyperkalemia: recieved lokelma. Resolved.   Metabolic acidosis, normal anion gap; Mild.  Now on HD so will defer to nephrology.  CAD status post PTCA 2015, PVD, hyperlipidemia, status post PPM; Treated with metoprolol, Lipitor, hydralazine  Uncontrolled hypertension/hypertension urgency Blood pressure slightly elevated this morning however he had not received his antihypertensives yet.  Will follow and monitor.  PAF; Continue with metoprolol/Eliquis.  PAD s/p BKA 4/30 Wound care consulted. Continue with daily dressing changes.  Dr [name] was informed of patient admission. He will see patient today.  Type 2 diabetes uncontrolled hyperglycemia: Hemoglobin A1c 8.6.  Blood sugar slightly elevated.  Will increase Lantus to 12 units twice daily and continue SSI.  Thrombocytopenia: Mild. Monitor  Morbid obesity: Weight loss counseling provided.  Estimated body mass index is 36.78 kg/m as calculated from the following:   Height as of this encounter: 6' (1.829 m).   Weight as of this encounter: 123 kg.   DVT prophylaxis: Heparin Code Status: Full code Family Communication: Care discussed with patient Disposition Plan:  Status is: Inpatient  Remains inpatient appropriate because:Persistent severe electrolyte disturbances and Ongoing active pain requiring inpatient pain management now on HD.   Dispo:  Patient From: Home  Planned Disposition: Home  Expected discharge date: To be determined  based on CLIP completed.  Medically stable for discharge:  No   Consultants:   Nephrology  Procedures:   EGD 05/24/2020:  Impression: - Normal esophagus. - Normal stomach. - Duodenitis. - Normal third portion of the duodenum. - No specimens collected. Recommendation:- Return patient to hospital ward for ongoing care. - Clear liquid diet today. - Continue present medications including  pantoprazole 40 mg po bid. - Duodenitis is a potential source for blood loss.  Recommend performing a colonoscopy tomorrow and  will discuss with patient when he recovers from  sedation.   Antimicrobials:    Subjective; Seen and examined.  He has no complaints.  He tells me that he has not had any complaints since admission.  DeniesObjective: Vitals:   06/01/20 0800 06/01/20 0830 06/01/20 0900 06/01/20 1016  BP: (!) 170/83 (!) 177/83 (!) 173/82 (!) 171/74  Pulse: 61 69 67 68  Resp: 14 15 20 18   Temp:    97.6 F (36.4 C)  TempSrc:    Oral  SpO2:    100%  Weight:      Height:        Intake/Output Summary (Last 24 hours) at 06/01/2020 1248 Last data filed at 06/01/2020 0900 Gross per 24 hour  Intake 360 ml  Output 700 ml  Net -340 ml   Filed Weights   05/31/20 1929 05/31/20 2142 06/01/20 0655  Weight: 123 kg 122.8 kg 123 kg    Examination:  General exam: Appears calm and comfortable  Respiratory system: Clear to auscultation. Respiratory effort normal. Cardiovascular system: S1 & S2 heard, RRR. No JVD, murmurs, rubs, gallops or clicks.  +1 pitting edema left lower extremity. Gastrointestinal system: Abdomen is nondistended, soft and nontender. No organomegaly or masses felt. Normal bowel sounds heard. Central nervous system: Alert and oriented. No  focal neurological deficits. Extremities: Right BKA.  Left transmetatarsal amputation. Skin: No rashes, lesions or ulcers.  Psychiatry: Judgement and insight appear normal. Mood & affect appropriate.   Data Reviewed: I have personally reviewed following labs and imaging studies  CBC: Recent Labs  Lab 05/28/20 0607 05/29/20 0629 05/30/20 0558 05/31/20 0751 06/01/20 0637  WBC 5.6 7.7 7.8 6.7 7.1  HGB 9.9* 9.9* 10.3* 10.1* 9.9*  HCT 29.7* 30.4* 31.2* 30.5* 29.7*  MCV 87.6 88.4 86.9 87.1 86.1  PLT 165 174 142* 149* 378*   Basic Metabolic Panel: Recent Labs  Lab 05/25/20 1308 05/26/20 0533 05/28/20 0607 05/29/20 0629 05/30/20 0827 05/31/20 0751 06/01/20 0453  NA 141   < > 133* 137 137 137 138  K 5.6*   < > 4.9 3.9 4.0 4.0 3.8  CL 110   < > 104 102 102 102 104  CO2 16*   < > 14* 21* 19* 18* 19*  GLUCOSE 118*   < > 257* 176* 129* 125* 260*  BUN 112*   < > 137* 129* 158* 172* 135*  CREATININE 7.10*   < > 8.23* 8.67* 8.64* 9.31* 7.47*  CALCIUM 8.4*   < > 8.2* 7.9* 7.9* 7.7* 7.7*  MG 1.8  --   --   --   --   --   --   PHOS 8.9*   < > 9.1* 9.9* 10.5* 10.0* 7.8*   < > = values in this interval not displayed.   GFR: Estimated Creatinine Clearance: 14.1 mL/min (A) (by C-G formula based on SCr of 7.47 mg/dL (H)). Liver Function Tests: Recent Labs  Lab 05/25/20 1308 05/25/20 1308 05/26/20 0533 05/27/20 0529 05/28/20  4742 05/29/20 0629 05/30/20 0827 05/31/20 0751 06/01/20 0453  AST 11*  --  19  --   --   --   --   --   --   ALT 18  --  23  --   --   --   --   --   --   ALKPHOS 96  --  87  --   --   --   --   --   --   BILITOT 0.5  --  0.5  --   --   --   --   --   --   PROT 6.6  --  6.6  --   --   --   --   --   --   ALBUMIN 3.5   < > 3.0*   < > 3.1* 3.2* 3.1* 2.7* 2.5*   < > = values in this interval not displayed.   No results for input(s): LIPASE, AMYLASE in the last 168 hours. No results for input(s): AMMONIA in the last 168 hours. Coagulation Profile: Recent  Labs  Lab 05/26/20 0851  INR 1.2   Cardiac Enzymes: No results for input(s): CKTOTAL, CKMB, CKMBINDEX, TROPONINI in the last 168 hours. BNP (last 3 results) No results for input(s): PROBNP in the last 8760 hours. HbA1C: No results for input(s): HGBA1C in the last 72 hours. CBG: Recent Labs  Lab 05/31/20 2213 05/31/20 2349 06/01/20 0419 06/01/20 0816 06/01/20 1018  GLUCAP 234* 244* 260* 168* 164*   Lipid Profile: No results for input(s): CHOL, HDL, LDLCALC, TRIG, CHOLHDL, LDLDIRECT in the last 72 hours. Thyroid Function Tests: No results for input(s): TSH, T4TOTAL, FREET4, T3FREE, THYROIDAB in the last 72 hours. Anemia Panel: No results for input(s): VITAMINB12, FOLATE, FERRITIN, TIBC, IRON, RETICCTPCT in the last 72 hours. Sepsis Labs: No results for input(s): PROCALCITON, LATICACIDVEN in the last 168 hours.  Recent Results (from the past 240 hour(s))  SARS Coronavirus 2 by RT PCR (hospital order, performed in [facility] hospital lab) Nasopharyngeal Nasopharyngeal Swab     Status: None   Collection Time: 05/23/20  4:00 PM   Specimen: Nasopharyngeal Swab  Result Value Ref Range Status   SARS Coronavirus 2 NEGATIVE NEGATIVE Final    Comment: (NOTE) SARS-CoV-2 target nucleic acids are NOT DETECTED.  The SARS-CoV-2 RNA is generally detectable in upper and lower respiratory specimens during the acute phase of infection. The lowest concentration of SARS-CoV-2 viral copies this assay can detect is 250 copies / mL. A negative result does not preclude SARS-CoV-2 infection and should not be used as the sole basis for treatment or other patient management decisions.  A negative result may occur with improper specimen collection / handling, submission of specimen other than nasopharyngeal swab, presence of viral mutation(s) within the areas targeted by this assay, and inadequate number of viral copies (<250 copies / mL). A negative result must be combined with  clinical observations, patient history, and epidemiological information.  Fact Sheet for Patients:   StrictlyIdeas.no  Fact Sheet for Healthcare Providers: BankingDealers.co.za  This test is not yet approved or  cleared by the [place] FDA and has been authorized for detection and/or diagnosis of SARS-CoV-2 by FDA under an Emergency Use Authorization (EUA).  This EUA will remain in effect (meaning this test can be used) for the duration of the COVID-19 declaration under Section 564(b)(1) of the Act, 21 U.S.C. section 360bbb-3(b)(1), unless the authorization is terminated or revoked sooner.  Performed  at [facility], [place] [address] [place], [place] [address]          Radiology Studies: IR Fluoro Guide CV Line Right  Result Date: 05/31/2020 CLINICAL DATA:  Renal failure, needs dialysis access EXAM: TUNNELED HEMODIALYSIS CATHETER PLACEMENT WITH ULTRASOUND AND FLUOROSCOPIC GUIDANCE TECHNIQUE: The procedure, risks, benefits, and alternatives were explained to the patient. Questions regarding the procedure were encouraged and answered. The patient understands and consents to the procedure. As antibiotic prophylaxis, vancomycin 1 g was ordered pre-procedure and administered intravenously within one hour of incision.Patency of the right IJ vein was confirmed with ultrasound with image documentation. An appropriate skin site was determined. Region was prepped using maximum barrier technique including cap and mask, sterile gown, sterile gloves, large sterile sheet, and Chlorhexidine as cutaneous antisepsis. The region was infiltrated locally with 1% lidocaine. Intravenous Fentanyl 50mcg and Versed 1mg  were administered as conscious sedation during continuous monitoring of the patient's level of consciousness and physiological / cardiorespiratory status by the radiology RN, with a total moderate sedation time of 13 minutes.  Under real-time ultrasound guidance, the right IJ vein was accessed with a 21 gauge micropuncture needle; the needle tip within the vein was confirmed with ultrasound image documentation. Needle exchanged over the 018 guidewire for transitional dilator, which allowed advancement of a Benson wire into the IVC. Over this, an MPA catheter was advanced. A Palindrome 23 hemodialysis catheter was tunneled from the right anterior chest wall approach to the right IJ dermatotomy site. The MPA catheter was exchanged over an Amplatz wire for serial vascular dilators which allow placement of a peel-away sheath, through which the catheter was advanced under intermittent fluoroscopy, positioned with its tips in the proximal and midright atrium. Spot chest radiograph confirms good catheter position. No pneumothorax. Catheter was flushed and primed per protocol. Catheter secured externally with O Prolene sutures. The right IJ dermatotomy site was closed with Dermabond. COMPLICATIONS: COMPLICATIONS None immediate FLUOROSCOPY TIME:  36 seconds; 6 mGy COMPARISON:  None IMPRESSION: 1. Technically successful placement of tunneled right IJ hemodialysis catheter with ultrasound and fluoroscopic guidance. Ready for routine use. ACCESS: Remains approachable for percutaneous intervention as needed. Electronically Signed   By: Lucrezia Europe M.D.   On: 05/31/2020 16:44   IR US Guide Vasc Access Right  Result Date: 06/01/2020 CLINICAL DATA:  Renal failure, needs dialysis access EXAM: TUNNELED HEMODIALYSIS CATHETER PLACEMENT WITH ULTRASOUND AND FLUOROSCOPIC GUIDANCE TECHNIQUE: The procedure, risks, benefits, and alternatives were explained to the patient. Questions regarding the procedure were encouraged and answered. The patient understands and consents to the procedure. As antibiotic prophylaxis, vancomycin 1 g was ordered pre-procedure and administered intravenously within one hour of incision.Patency of the right IJ vein was confirmed with  ultrasound with image documentation. An appropriate skin site was determined. Region was prepped using maximum barrier technique including cap and mask, sterile gown, sterile gloves, large sterile sheet, and Chlorhexidine as cutaneous antisepsis. The region was infiltrated locally with 1% lidocaine. Intravenous Fentanyl 57mcg and Versed 1mg  were administered as conscious sedation during continuous monitoring of the patient's level of consciousness and physiological / cardiorespiratory status by the radiology RN, with a total moderate sedation time of 13 minutes. Under real-time ultrasound guidance, the right IJ vein was accessed with a 21 gauge micropuncture needle; the needle tip within the vein was confirmed with ultrasound image documentation. Needle exchanged over the 018 guidewire for transitional dilator, which allowed advancement of a Benson wire into the IVC. Over this, an MPA catheter  was advanced. A Palindrome 23 hemodialysis catheter was tunneled from the right anterior chest wall approach to the right IJ dermatotomy site. The MPA catheter was exchanged over an Amplatz wire for serial vascular dilators which allow placement of a peel-away sheath, through which the catheter was advanced under intermittent fluoroscopy, positioned with its tips in the proximal and midright atrium. Spot chest radiograph confirms good catheter position. No pneumothorax. Catheter was flushed and primed per protocol. Catheter secured externally with O Prolene sutures. The right IJ dermatotomy site was closed with Dermabond. COMPLICATIONS: COMPLICATIONS None immediate FLUOROSCOPY TIME:  36 seconds; 6 mGy COMPARISON:  None IMPRESSION: 1. Technically successful placement of tunneled right IJ hemodialysis catheter with ultrasound and fluoroscopic guidance. Ready for routine use. ACCESS: Remains approachable for percutaneous intervention as needed. Electronically Signed   By: Lucrezia Europe M.D.   On: 05/31/2020 16:44         Scheduled Meds:  amLODipine  10 mg Oral Daily   apixaban  5 mg Oral BID   atorvastatin  20 mg Oral Daily   Chlorhexidine Gluconate Cloth  6 each Topical H1834   folic acid  1 mg Oral Daily   hydrALAZINE  100 mg Oral TID   insulin aspart  0-15 Units Subcutaneous Q4H   insulin glargine  12 Units Subcutaneous BID   metoprolol tartrate  100 mg Oral BID   multivitamin with minerals  1 tablet Oral Daily   pantoprazole  40 mg Oral BID   prazosin  2 mg Oral QPC supper   predniSONE  60 mg Oral Q breakfast   sevelamer carbonate  800 mg Oral TID WC   thiamine  100 mg Oral Daily   Or   thiamine  100 mg Intravenous Daily   Continuous Infusions:   LOS: 9 days    Time spent: 36 minutes.   Darliss Cheney, MD [facility]  If 7PM-7AM, please contact night-coverage [email or web address]  06/01/2020, 12:48 PM

## 2020-06-01 NOTE — Progress Notes (Signed)
This is a pleasant 59 year old gentleman seen today in dialysis.  He is status post right transtibial amputation by [name] in May.  He is comfortable lying in bed.  At his last visit he was given a prescription to have a prosthetic made by [employer].  Patient states he has not done this yet because "it has not completely healed up yet "  He has a dressing beneath the shrinker as well as a large Xeroform sheet over the end of the amputation stump.  Swelling is well controlled.  There are 2 areas both on the anterior and medial side that are mild dehiscence is that are healing.  Overall the wound is wet from the Xeroform  Explained to the patient today and will write an order that it is important that the shrinker be right against the skin as it is medicated.  If there is any drainage a dressing could be applied over the top.  He does have another shrinker and should alternate these daily.  If shrinker is unavailable that is cleaned and he should have a dry dressing.  Simply 4 x 4's and an Ace wrap.

## 2020-06-01 NOTE — Progress Notes (Signed)
Orthopedic Tech Progress Note Patient Details:  Alexander [name] August 19, 1961 [id number] Called in order to [facility] for a [facility] for a BKA Patient ID: Alexander [name], male   DOB: [birth year], 59 y.o.   MRN: [id number]   [name] 06/01/2020, 10:23 AM

## 2020-06-01 NOTE — Progress Notes (Signed)
Renal Navigator discussed patient with Nephrologist/Dr. Royce Macadamia and patient. Navigator will start OP HD referral for AKI and cancel if needed. Patient is closest to [facility], however, they are not accepting new patients at this time. [name] is next closest clinic to patient.  Navigator completed referral to [facility] to request care at [facility] clinic and will continue to follow closely.  Alphonzo Cruise, [place]  Renal Navigator [phone]

## 2020-06-01 NOTE — Plan of Care (Signed)
  Problem: Activity: Goal: Risk for activity intolerance will decrease Outcome: Progressing   

## 2020-06-01 NOTE — Progress Notes (Signed)
Patient ID: Alexander [name], male   DOB: Mar 22, 1961, 59 y.o.   MRN: [id number] [facility] Progress Note   Assessment/ Plan:   1. Acute kidney Injury on chronic kidney disease stage IV: Baseline creatinine ranging 2.5-3.0 and admitted with creatinine of 6.8/potassium 5.8 following which he underwent renal biopsy because of proteinuria and hematuria.  Empirically on corticosteroids, serologies in process and without evidence of plasma cell dyscrasia. Hepatitis nonreactive.  Had first HD on 7/20 after tunneled catheter with IR.   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  Await serologies  - HD on 7/21 as well as 7/22 then assess needs daily    - note GBM pending, MPO/PR3 pending - will need to interpret renal bx results in context of ANCA studies once they are available.  Note baseline Cr   - for now on pred 60 mg daily  - ordered renal DM diet - Discussed with Colleen re: CLIP as AKI   2.  GI bleed/melena: Suspected to be secondary to duodenitis based on EGD/colonoscopy and currently on PPI.  He is hemodynamically stable and without indications for PRBC transfusion.  3.  Hypertension: increased amlodipine to 10 mg daily.  He missed multiple meds on 7/20 including the amlodipine and hydralazine doses.  Follow on current regimen for now.  Lisinopril and furosemide currently on hold.  4.  Peripheral vascular disease status post right BKA with intact dressing-malodorous.  5. Metabolic acidosis - stop bicarbonate; now on on HD  6. Metabolic bone disease/Hyperphosphatemia - on HD and started renvela. Check intact PTH   7.  Type 2 diabetes mellitus: Insulin-dependent and likely the predominating etiology of his underlying chronic kidney disease.  Subjective:   Had first HD on 7/20 with keep even.  Seen and examined on dialysis at 8:29 am.  Blood pressure 162/77 and HR 61. Tolerating goal.  RIJ tunn cath.  Frustrated because he feels well and doesn't understand  why labs are not reflecting.   Review of systems:   Denies shortness of breath or chest pain  Denies n/v  Denies mental status changes that he is aware of    Objective:   BP (!) 170/83   Pulse 61   Temp 98.1 F (36.7 C) (Oral)   Resp 14   Ht 6' (1.829 m)   Wt 123 kg   SpO2 98%   BMI 36.78 kg/m   Intake/Output Summary (Last 24 hours) at 06/01/2020 0820 Last data filed at 06/01/2020 0600 Gross per 24 hour  Intake 360 ml  Output 1075 ml  Net -715 ml   Weight change:   Physical Exam:   Gen: adult male in bed in NAD   CVS: S1 and S2 no rub Resp: Clear to auscultation, no rales/rhonchi; unlabored at rest  Abd: Soft, obese, nontender Ext: Trace left leg/ankle edema. Right leg status post BKA pysch normal mood and affect Neuro  Alert and oriented x 3 provides hx and follows commands Access: RIJ tunneled catheter    Imaging: IR Fluoro Guide CV Line Right  Result Date: 05/31/2020 CLINICAL DATA:  Renal failure, needs dialysis access EXAM: TUNNELED HEMODIALYSIS CATHETER PLACEMENT WITH ULTRASOUND AND FLUOROSCOPIC GUIDANCE TECHNIQUE: The procedure, risks, benefits, and alternatives were explained to the patient. Questions regarding the procedure were encouraged and answered. The patient understands and consents to the procedure. As antibiotic prophylaxis, vancomycin 1 g was ordered pre-procedure and administered intravenously within one hour of incision.Patency of the right IJ  vein was confirmed with ultrasound with image documentation. An appropriate skin site was determined. Region was prepped using maximum barrier technique including cap and mask, sterile gown, sterile gloves, large sterile sheet, and Chlorhexidine as cutaneous antisepsis. The region was infiltrated locally with 1% lidocaine. Intravenous Fentanyl 40mcg and Versed 1mg  were administered as conscious sedation during continuous monitoring of the patient's level of consciousness and physiological / cardiorespiratory status by  the radiology RN, with a total moderate sedation time of 13 minutes. Under real-time ultrasound guidance, the right IJ vein was accessed with a 21 gauge micropuncture needle; the needle tip within the vein was confirmed with ultrasound image documentation. Needle exchanged over the 018 guidewire for transitional dilator, which allowed advancement of a Benson wire into the IVC. Over this, an MPA catheter was advanced. A Palindrome 23 hemodialysis catheter was tunneled from the right anterior chest wall approach to the right IJ dermatotomy site. The MPA catheter was exchanged over an Amplatz wire for serial vascular dilators which allow placement of a peel-away sheath, through which the catheter was advanced under intermittent fluoroscopy, positioned with its tips in the proximal and midright atrium. Spot chest radiograph confirms good catheter position. No pneumothorax. Catheter was flushed and primed per protocol. Catheter secured externally with O Prolene sutures. The right IJ dermatotomy site was closed with Dermabond. COMPLICATIONS: COMPLICATIONS None immediate FLUOROSCOPY TIME:  36 seconds; 6 mGy COMPARISON:  None IMPRESSION: 1. Technically successful placement of tunneled right IJ hemodialysis catheter with ultrasound and fluoroscopic guidance. Ready for routine use. ACCESS: Remains approachable for percutaneous intervention as needed. Electronically Signed   By: Lucrezia Europe M.D.   On: 05/31/2020 16:44    Labs: BMET Recent Labs  Lab 05/25/20 1308 05/25/20 1308 05/26/20 0533 05/27/20 0529 05/28/20 2395 05/29/20 0629 05/30/20 0827 05/31/20 0751 06/01/20 0453  NA 141   < > 137 135 133* 137 137 137 138  K 5.6*   < > 5.7* 5.2* 4.9 3.9 4.0 4.0 3.8  CL 110   < > 111 109 104 102 102 102 104  CO2 16*   < > 14* 11* 14* 21* 19* 18* 19*  GLUCOSE 118*   < > 193* 246* 257* 176* 129* 125* 260*  BUN 112*   < > 110* 121* 137* 129* 158* 172* 135*  CREATININE 7.10*   < > 7.01* 8.23* 8.23* 8.67* 8.64* 9.31*  7.47*  CALCIUM 8.4*   < > 8.3* 8.4* 8.2* 7.9* 7.9* 7.7* 7.7*  PHOS 8.9*  --   --  9.3* 9.1* 9.9* 10.5* 10.0* 7.8*   < > = values in this interval not displayed.   CBC Recent Labs  Lab 05/29/20 0629 05/30/20 0558 05/31/20 0751 06/01/20 0637  WBC 7.7 7.8 6.7 7.1  HGB 9.9* 10.3* 10.1* 9.9*  HCT 30.4* 31.2* 30.5* 29.7*  MCV 88.4 86.9 87.1 86.1  PLT 174 142* 149* 144*    Medications:    . amLODipine  10 mg Oral Daily  . apixaban  5 mg Oral BID  . atorvastatin  20 mg Oral Daily  . Chlorhexidine Gluconate Cloth  6 each Topical Q0600  . folic acid  1 mg Oral Daily  . heparin sodium (porcine)      . hydrALAZINE  100 mg Oral TID  . insulin aspart  0-15 Units Subcutaneous Q4H  . insulin glargine  8 Units Subcutaneous BID  . metoprolol tartrate  100 mg Oral BID  . multivitamin with minerals  1 tablet Oral Daily  .  pantoprazole  40 mg Oral BID  . prazosin  2 mg Oral QPC supper  . predniSONE  60 mg Oral Q breakfast  . sevelamer carbonate  800 mg Oral TID WC  . sodium bicarbonate  650 mg Oral TID  . thiamine  100 mg Oral Daily   Or  . thiamine  100 mg Intravenous Daily   Claudia Desanctis, MD 06/01/2020, 8:34 AM

## 2020-06-02 LAB — CBC WITH DIFFERENTIAL/PLATELET
Abs Immature Granulocytes: 0.02 10*3/uL (ref 0.00–0.07)
Basophils Absolute: 0 10*3/uL (ref 0.0–0.1)
Basophils Relative: 0 %
Eosinophils Absolute: 0 10*3/uL (ref 0.0–0.5)
Eosinophils Relative: 0 %
HCT: 28.9 % — ABNORMAL LOW (ref 39.0–52.0)
Hemoglobin: 9.3 g/dL — ABNORMAL LOW (ref 13.0–17.0)
Immature Granulocytes: 0 %
Lymphocytes Relative: 16 %
Lymphs Abs: 1.1 10*3/uL (ref 0.7–4.0)
MCH: 27.9 pg (ref 26.0–34.0)
MCHC: 32.2 g/dL (ref 30.0–36.0)
MCV: 86.8 fL (ref 80.0–100.0)
Monocytes Absolute: 0.6 10*3/uL (ref 0.1–1.0)
Monocytes Relative: 8 %
Neutro Abs: 5.5 10*3/uL (ref 1.7–7.7)
Neutrophils Relative %: 76 %
Platelets: 112 10*3/uL — ABNORMAL LOW (ref 150–400)
RBC: 3.33 MIL/uL — ABNORMAL LOW (ref 4.22–5.81)
RDW: 14.1 % (ref 11.5–15.5)
WBC: 7.2 10*3/uL (ref 4.0–10.5)
nRBC: 0 % (ref 0.0–0.2)

## 2020-06-02 LAB — RENAL FUNCTION PANEL
Albumin: 2.6 g/dL — ABNORMAL LOW (ref 3.5–5.0)
Anion gap: 10 (ref 5–15)
BUN: 103 mg/dL — ABNORMAL HIGH (ref 8–23)
CO2: 24 mmol/L (ref 22–32)
Calcium: 7.7 mg/dL — ABNORMAL LOW (ref 8.9–10.3)
Chloride: 102 mmol/L (ref 98–111)
Creatinine, Ser: 6.15 mg/dL — ABNORMAL HIGH (ref 0.61–1.24)
GFR calc Af Amer: 10 mL/min — ABNORMAL LOW (ref >60)
GFR calc non Af Amer: 9 mL/min — ABNORMAL LOW (ref >60)
Glucose, Bld: 235 mg/dL — ABNORMAL HIGH (ref 70–99)
Phosphorus: 6 mg/dL — ABNORMAL HIGH (ref 2.5–4.6)
Potassium: 4.4 mmol/L (ref 3.5–5.1)
Sodium: 136 mmol/L (ref 135–145)

## 2020-06-02 LAB — GLUCOSE, CAPILLARY
Glucose-Capillary: 196 mg/dL — ABNORMAL HIGH (ref 70–99)
Glucose-Capillary: 237 mg/dL — ABNORMAL HIGH (ref 70–99)
Glucose-Capillary: 242 mg/dL — ABNORMAL HIGH (ref 70–99)
Glucose-Capillary: 253 mg/dL — ABNORMAL HIGH (ref 70–99)
Glucose-Capillary: 316 mg/dL — ABNORMAL HIGH (ref 70–99)

## 2020-06-02 MED ORDER — HEPARIN SODIUM (PORCINE) 1000 UNIT/ML IJ SOLN
INTRAMUSCULAR | Status: AC
  Administered 2020-06-02: 1000 [IU]
  Filled 2020-06-02: qty 4

## 2020-06-02 MED ORDER — INSULIN GLARGINE 100 UNIT/ML ~~LOC~~ SOLN
15.0000 [IU] | Freq: Two times a day (BID) | SUBCUTANEOUS | Status: DC
  Administered 2020-06-02 (×2): 15 [IU] via SUBCUTANEOUS
  Filled 2020-06-02 (×4): qty 0.15

## 2020-06-02 MED ORDER — ISOSORBIDE MONONITRATE ER 30 MG PO TB24
15.0000 mg | ORAL_TABLET | Freq: Every day | ORAL | Status: DC
  Administered 2020-06-02 – 2020-06-03 (×2): 15 mg via ORAL
  Filled 2020-06-02 (×2): qty 1

## 2020-06-02 NOTE — Progress Notes (Signed)
Patient refused bed alarm. Advised patient to call before getting up to use commode. Patient verbalized understanding. Call bell within reach.

## 2020-06-02 NOTE — Progress Notes (Signed)
PROGRESS NOTE    Alexander [name]  QQV:[id number] DOB: July 31, 1961 DOA: 05/23/2020 PCP: [name], MD   Brief Narrative: 59 year old with past medical history significant for A. fib on Eliquis, recent right TKA/daily, CAD status post PTCA 2015, PVD, CKD stage IV baseline creatinine 2.5--3 followed by Dr. Justin Mend who presents to the ED for evaluation of black tarry stool for 2 days.  Denies taking any iron or Pepto-Bismol.  Patient has had a prior endoscopy and colonoscopy 2015 that was unremarkable.  Patient presents with GI bleed and found to have AKI on CKD.  Underwent endoscopy 7/13 2021, colonoscopy 05/25/2020 and IR guided renal biopsy on 05/26/2020.  Renal function progressively continued to worsen and nephrology following for evaluation for hemodialysis.  Per Nephrology note preliminary biopsy report consistent with diabetic nephropathy with nodular glomerulosclerosis and rare extra capillary proliferation interpreted to present pseudocrescents.  Focal acute tubular injury with intratubular red blood cell casts moderate to severe interstitial fibrosis and tubular atrophy.  Awaiting final biopsy report.   Patient underwent tunneled hemodialysis catheter on 7/20 in anticipation to start hemodialysis.  For his chronic stump wound, [name] was consulted.  Assessment & Plan:   Principal Problem:   GI bleeding Active Problems:   PAD (peripheral artery disease) (HCC)   Acute renal failure (HCC)   CAD (coronary artery disease), native coronary artery   Essential hypertension, benign   Acute hematogenous osteomyelitis of right foot (HCC)   Acute GI bleeding   Occult blood in stools   Melena  1-GI, Duodenitis;  Underwent endoscopy and colonoscopy.  Endoscopy showed duodenitis.  Likely a potential source for GI bleed. Colonoscopy showed no acute bleeding. Continue with PPI twice daily. Okay to resume anticoagulation per GI. Hb remain stable.  Patient has not required any transfusion  during this hospitalization.  2-AKI on stage IV CKD: Baseline creatinine 2.5--3 due to diabetic nephropathy but admitted with creatinine of 6.78. BUN elevation in part due to GI bleed.  Creatinine peaked at 9.31 on 05/31/2020.  Status post renal biopsy 05/26/2020, final report pending.  Had [place] on 05/31/2020 with first HD session. Patient received high-dose IV steroid, now a steroid has been tapering off.  Appreciate nephrology assistance.  Hyperkalemia: recieved lokelma. Resolved.   Metabolic acidosis, normal anion gap; Mild.  Now on HD so will defer to nephrology.  CAD status post PTCA 2015, PVD, hyperlipidemia, status post PPM; Treated with metoprolol, Lipitor, hydralazine  Uncontrolled hypertension/hypertension urgency Blood pressure slightly elevated this morning.  Already on max dose of amlodipine, Lopressor and hydralazine.  We will add Imdur 15 mg.  PAF; Continue with metoprolol/Eliquis.  PAD s/p BKA 4/30 Wound care consulted. Continue with daily dressing changes.  Dr [name] was informed of patient admission. He will see patient today.  Type 2 diabetes uncontrolled hyperglycemia: Hemoglobin A1c 8.6.  Blood sugar still slightly elevated.  Will increase Lantus to 15 units twice daily and continue SSI.  Thrombocytopenia: Mild. Monitor  Morbid obesity: Weight loss counseling provided.  Estimated body mass index is 36.45 kg/m as calculated from the following:   Height as of this encounter: 6' (1.829 m).   Weight as of this encounter: 121.9 kg.   DVT prophylaxis: Heparin Code Status: Full code Family Communication: Care discussed with patient Disposition Plan:  Status is: Inpatient  Remains inpatient appropriate because:Persistent severe electrolyte disturbances and Ongoing active pain requiring inpatient pain management now on HD.   Dispo:  Patient From: Home  Planned Disposition: Home  Expected  discharge date: To be determined based on CLIP completed.  Medically  stable for discharge: No   Consultants:   Nephrology  Procedures:   EGD 05/24/2020:  Impression: - Normal esophagus. - Normal stomach. - Duodenitis. - Normal third portion of the duodenum. - No specimens collected. Recommendation:- Return patient to hospital ward for ongoing care. - Clear liquid diet today. - Continue present medications including  pantoprazole 40 mg po bid. - Duodenitis is a potential source for blood loss.  Recommend performing a colonoscopy tomorrow and  will discuss with patient when he recovers from  sedation.   Antimicrobials:    Subjective; Seen and examined in dialysis unit.  He had no complaint like yesterday.  He is just frustrated for being in the hospital.  DeniesObjective: Vitals:   06/02/20 0930 06/02/20 0953 06/02/20 1000 06/02/20 1032  BP: (!) 176/77 (!) 174/82 (!) 186/96 (!) 183/74  Pulse: 60 68 67 74  Resp: 14 17 17 18   Temp:   97.8 F (36.6 C) 97.8 F (36.6 C)  TempSrc:   Oral Oral  SpO2:  98%  97%  Weight:  121.9 kg    Height:        Intake/Output Summary (Last 24 hours) at 06/02/2020 1327 Last data filed at 06/02/2020 0953 Gross per 24 hour  Intake 360 ml  Output 1475 ml  Net -1115 ml   Filed Weights   06/01/20 0655 06/02/20 0645 06/02/20 0953  Weight: 123 kg 122.9 kg 121.9 kg    Examination:  General exam: Appears calm and comfortable  Respiratory system: Clear to auscultation. Respiratory effort normal. Cardiovascular system: S1 & S2 heard, RRR. No JVD, murmurs, rubs, gallops or clicks. No pedal edema. Gastrointestinal system: Abdomen is nondistended, soft and nontender. No organomegaly or masses felt. Normal bowel sounds  heard. Central nervous system: Alert and oriented. No focal neurological deficits. Extremities: Symmetric 5 x 5 power. Skin: No rashes, lesions or ulcers.  Psychiatry: Judgement and insight appear normal. Mood & affect appropriate.    Data Reviewed: I have personally reviewed following labs and imaging studies  CBC: Recent Labs  Lab 05/29/20 0629 05/30/20 0558 05/31/20 0751 06/01/20 0637 06/02/20 0404  WBC 7.7 7.8 6.7 7.1 7.2  NEUTROABS  --   --   --   --  5.5  HGB 9.9* 10.3* 10.1* 9.9* 9.3*  HCT 30.4* 31.2* 30.5* 29.7* 28.9*  MCV 88.4 86.9 87.1 86.1 86.8  PLT 174 142* 149* 144* 742*   Basic Metabolic Panel: Recent Labs  Lab 05/29/20 0629 05/30/20 0827 05/31/20 0751 06/01/20 0453 06/02/20 0404  NA 137 137 137 138 136  K 3.9 4.0 4.0 3.8 4.4  CL 102 102 102 104 102  CO2 21* 19* 18* 19* 24  GLUCOSE 176* 129* 125* 260* 235*  BUN 129* 158* 172* 135* 103*  CREATININE 8.67* 8.64* 9.31* 7.47* 6.15*  CALCIUM 7.9* 7.9* 7.7* 7.7* 7.7*  PHOS 9.9* 10.5* 10.0* 7.8* 6.0*   GFR: Estimated Creatinine Clearance: 17 mL/min (A) (by C-G formula based on SCr of 6.15 mg/dL (H)). Liver Function Tests: Recent Labs  Lab 05/29/20 0629 05/30/20 0827 05/31/20 0751 06/01/20 0453 06/02/20 0404  ALBUMIN 3.2* 3.1* 2.7* 2.5* 2.6*   No results for input(s): LIPASE, AMYLASE in the last 168 hours. No results for input(s): AMMONIA in the last 168 hours. Coagulation Profile: No results for input(s): INR, PROTIME in the last 168 hours. Cardiac Enzymes: No results for input(s): CKTOTAL, CKMB, CKMBINDEX, TROPONINI in the last 168 hours.  BNP (last 3 results) No results for input(s): PROBNP in the last 8760 hours. HbA1C: No results for input(s): HGBA1C in the last 72 hours. CBG: Recent Labs  Lab 06/01/20 1648 06/01/20 2013 06/02/20 0055 06/02/20 0501 06/02/20 1146  GLUCAP 298* 267* 242* 196* 253*   Lipid Profile: No results for input(s): CHOL, HDL, LDLCALC, TRIG, CHOLHDL, LDLDIRECT in  the last 72 hours. Thyroid Function Tests: No results for input(s): TSH, T4TOTAL, FREET4, T3FREE, THYROIDAB in the last 72 hours. Anemia Panel: No results for input(s): VITAMINB12, FOLATE, FERRITIN, TIBC, IRON, RETICCTPCT in the last 72 hours. Sepsis Labs: No results for input(s): PROCALCITON, LATICACIDVEN in the last 168 hours.  Recent Results (from the past 240 hour(s))  SARS Coronavirus 2 by RT PCR (hospital order, performed in [facility] hospital lab) Nasopharyngeal Nasopharyngeal Swab     Status: None   Collection Time: 05/23/20  4:00 PM   Specimen: Nasopharyngeal Swab  Result Value Ref Range Status   SARS Coronavirus 2 NEGATIVE NEGATIVE Final    Comment: (NOTE) SARS-CoV-2 target nucleic acids are NOT DETECTED.  The SARS-CoV-2 RNA is generally detectable in upper and lower respiratory specimens during the acute phase of infection. The lowest concentration of SARS-CoV-2 viral copies this assay can detect is 250 copies / mL. A negative result does not preclude SARS-CoV-2 infection and should not be used as the sole basis for treatment or other patient management decisions.  A negative result may occur with improper specimen collection / handling, submission of specimen other than nasopharyngeal swab, presence of viral mutation(s) within the areas targeted by this assay, and inadequate number of viral copies (<250 copies / mL). A negative result must be combined with clinical observations, patient history, and epidemiological information.  Fact Sheet for Patients:   StrictlyIdeas.no  Fact Sheet for Healthcare Providers: BankingDealers.co.za  This test is not yet approved or  cleared by the [place] FDA and has been authorized for detection and/or diagnosis of SARS-CoV-2 by FDA under an Emergency Use Authorization (EUA).  This EUA will remain in effect (meaning this test can be used) for the duration of the COVID-19  declaration under Section 564(b)(1) of the Act, 21 U.S.C. section 360bbb-3(b)(1), unless the authorization is terminated or revoked sooner.  Performed at [facility], [place] [address] [place], [place] [address]          Radiology Studies: IR Fluoro Guide CV Line Right  Result Date: 05/31/2020 CLINICAL DATA:  Renal failure, needs dialysis access EXAM: TUNNELED HEMODIALYSIS CATHETER PLACEMENT WITH ULTRASOUND AND FLUOROSCOPIC GUIDANCE TECHNIQUE: The procedure, risks, benefits, and alternatives were explained to the patient. Questions regarding the procedure were encouraged and answered. The patient understands and consents to the procedure. As antibiotic prophylaxis, vancomycin 1 g was ordered pre-procedure and administered intravenously within one hour of incision.Patency of the right IJ vein was confirmed with ultrasound with image documentation. An appropriate skin site was determined. Region was prepped using maximum barrier technique including cap and mask, sterile gown, sterile gloves, large sterile sheet, and Chlorhexidine as cutaneous antisepsis. The region was infiltrated locally with 1% lidocaine. Intravenous Fentanyl 79mcg and Versed 1mg  were administered as conscious sedation during continuous monitoring of the patient's level of consciousness and physiological / cardiorespiratory status by the radiology RN, with a total moderate sedation time of 13 minutes. Under real-time ultrasound guidance, the right IJ vein was accessed with a 21 gauge micropuncture needle; the needle tip within the vein was confirmed with ultrasound image documentation. Needle exchanged  over the 018 guidewire for transitional dilator, which allowed advancement of a Benson wire into the IVC. Over this, an MPA catheter was advanced. A Palindrome 23 hemodialysis catheter was tunneled from the right anterior chest wall approach to the right IJ dermatotomy site. The MPA catheter was exchanged over an  Amplatz wire for serial vascular dilators which allow placement of a peel-away sheath, through which the catheter was advanced under intermittent fluoroscopy, positioned with its tips in the proximal and midright atrium. Spot chest radiograph confirms good catheter position. No pneumothorax. Catheter was flushed and primed per protocol. Catheter secured externally with O Prolene sutures. The right IJ dermatotomy site was closed with Dermabond. COMPLICATIONS: COMPLICATIONS None immediate FLUOROSCOPY TIME:  36 seconds; 6 mGy COMPARISON:  None IMPRESSION: 1. Technically successful placement of tunneled right IJ hemodialysis catheter with ultrasound and fluoroscopic guidance. Ready for routine use. ACCESS: Remains approachable for percutaneous intervention as needed. Electronically Signed   By: Lucrezia Europe M.D.   On: 05/31/2020 16:44   IR US Guide Vasc Access Right  Result Date: 06/01/2020 CLINICAL DATA:  Renal failure, needs dialysis access EXAM: TUNNELED HEMODIALYSIS CATHETER PLACEMENT WITH ULTRASOUND AND FLUOROSCOPIC GUIDANCE TECHNIQUE: The procedure, risks, benefits, and alternatives were explained to the patient. Questions regarding the procedure were encouraged and answered. The patient understands and consents to the procedure. As antibiotic prophylaxis, vancomycin 1 g was ordered pre-procedure and administered intravenously within one hour of incision.Patency of the right IJ vein was confirmed with ultrasound with image documentation. An appropriate skin site was determined. Region was prepped using maximum barrier technique including cap and mask, sterile gown, sterile gloves, large sterile sheet, and Chlorhexidine as cutaneous antisepsis. The region was infiltrated locally with 1% lidocaine. Intravenous Fentanyl 17mcg and Versed 1mg  were administered as conscious sedation during continuous monitoring of the patient's level of consciousness and physiological / cardiorespiratory status by the radiology RN,  with a total moderate sedation time of 13 minutes. Under real-time ultrasound guidance, the right IJ vein was accessed with a 21 gauge micropuncture needle; the needle tip within the vein was confirmed with ultrasound image documentation. Needle exchanged over the 018 guidewire for transitional dilator, which allowed advancement of a Benson wire into the IVC. Over this, an MPA catheter was advanced. A Palindrome 23 hemodialysis catheter was tunneled from the right anterior chest wall approach to the right IJ dermatotomy site. The MPA catheter was exchanged over an Amplatz wire for serial vascular dilators which allow placement of a peel-away sheath, through which the catheter was advanced under intermittent fluoroscopy, positioned with its tips in the proximal and midright atrium. Spot chest radiograph confirms good catheter position. No pneumothorax. Catheter was flushed and primed per protocol. Catheter secured externally with O Prolene sutures. The right IJ dermatotomy site was closed with Dermabond. COMPLICATIONS: COMPLICATIONS None immediate FLUOROSCOPY TIME:  36 seconds; 6 mGy COMPARISON:  None IMPRESSION: 1. Technically successful placement of tunneled right IJ hemodialysis catheter with ultrasound and fluoroscopic guidance. Ready for routine use. ACCESS: Remains approachable for percutaneous intervention as needed. Electronically Signed   By: Lucrezia Europe M.D.   On: 05/31/2020 16:44        Scheduled Meds:  amLODipine  10 mg Oral Daily   apixaban  5 mg Oral BID   atorvastatin  20 mg Oral Daily   Chlorhexidine Gluconate Cloth  6 each Topical [id number]   folic acid  1 mg Oral Daily   hydrALAZINE  100 mg Oral TID   insulin aspart  0-15 Units Subcutaneous Q4H   insulin glargine  15 Units Subcutaneous BID   isosorbide mononitrate  15 mg Oral Daily   metoprolol tartrate  100 mg Oral BID   multivitamin with minerals  1 tablet Oral Daily   pantoprazole  40 mg Oral BID   prazosin  2 mg Oral  QPC supper   predniSONE  60 mg Oral Q breakfast   sevelamer carbonate  800 mg Oral TID WC   thiamine  100 mg Oral Daily   Or   thiamine  100 mg Intravenous Daily   Continuous Infusions:   LOS: 10 days    Time spent: 30 minutes.   Darliss Cheney, MD [facility]  If 7PM-7AM, please contact night-coverage [email or web address]  06/02/2020, 1:27 PM

## 2020-06-02 NOTE — Progress Notes (Signed)
Patient ID: Alexander [name], male   DOB: May 25, 1961, 59 y.o.   MRN: [id number] [facility] Progress Note   Assessment/ Plan:   1. Acute kidney Injury on chronic kidney disease stage IV: Baseline creatinine ranging 2.5-3.0 and admitted with creatinine of 6.8/potassium 5.8 following which he underwent renal biopsy because of proteinuria and hematuria.  Empirically on corticosteroids, serologies in process and without evidence of plasma cell dyscrasia. Hepatitis nonreactive.  Had first HD on 7/20 after tunneled catheter with IR.   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  Await serologies  - HD on 7/21 as well as 7/22 then assess needs daily    - note GBM pending, elevated PR3, modestly elevated MPO. Note baseline Cr --> will review biopsy to eval IF/TA to see if immunosuppressive therapy risk < benefit.   - for now on pred 60 mg daily  - ordered renal DM diet - Discussed with Colleen re: CLIP as AKI --> [facility] closed to new pt, [facility] referral made.   2.  GI bleed/melena: Suspected to be secondary to duodenitis based on EGD/colonoscopy and currently on PPI.  He is hemodynamically stable and without indications for PRBC transfusion.  3.  Hypertension: increased amlodipine to 10 mg daily.  He missed multiple meds on 7/20 including the amlodipine and hydralazine doses.  Follow on current regimen for now.  Lisinopril and furosemide currently on hold.  BP overnight fine, this AM high - monitor.   4.  Peripheral vascular disease status post right BKA with intact dressing.  5. Metabolic acidosis - stopped bicarbonate; now on on HD  6. Metabolic bone disease/Hyperphosphatemia - on HD and started renvela. Check intact PTH, pending.   7.  Type 2 diabetes mellitus: Insulin-dependent and likely the predominating etiology of his underlying chronic kidney disease.  Subjective:   HD #3 today.  Seen during treatment.  No new issues, feeling well.    Review of systems:    Denies shortness of breath or chest pain  Denies n/v  Denies mental status changes that he is aware of    Objective:   BP (!) 187/80   Pulse 61   Temp 98 F (36.7 C) (Oral)   Resp 14   Ht 6' (1.829 m)   Wt 122.9 kg   SpO2 98%   BMI 36.75 kg/m   Intake/Output Summary (Last 24 hours) at 06/02/2020 5784 Last data filed at 06/02/2020 6962 Gross per 24 hour  Intake 600 ml  Output 725 ml  Net -125 ml   Weight change: -0.1 kg  Physical Exam:   Gen: adult male in bed in NAD   CVS: S1 and S2 no rub Resp: Clear to auscultation, no rales/rhonchi; unlabored at rest  Abd: Soft, obese, nontender Ext: 1+left leg/ankle edema. Right leg status post BKA pysch normal mood and affect Neuro  Alert and oriented x 3 provides hx and follows commands Access: RIJ tunneled catheter with some dried blood at exit site but o/w c/d/i no erythema  Imaging: IR Fluoro Guide CV Line Right  Result Date: 05/31/2020 CLINICAL DATA:  Renal failure, needs dialysis access EXAM: TUNNELED HEMODIALYSIS CATHETER PLACEMENT WITH ULTRASOUND AND FLUOROSCOPIC GUIDANCE TECHNIQUE: The procedure, risks, benefits, and alternatives were explained to the patient. Questions regarding the procedure were encouraged and answered. The patient understands and consents to the procedure. As antibiotic prophylaxis, vancomycin 1 g was ordered pre-procedure and administered intravenously within one hour of incision.Patency of the right IJ  vein was confirmed with ultrasound with image documentation. An appropriate skin site was determined. Region was prepped using maximum barrier technique including cap and mask, sterile gown, sterile gloves, large sterile sheet, and Chlorhexidine as cutaneous antisepsis. The region was infiltrated locally with 1% lidocaine. Intravenous Fentanyl 41mcg and Versed 1mg  were administered as conscious sedation during continuous monitoring of the patient's level of consciousness and physiological / cardiorespiratory  status by the radiology RN, with a total moderate sedation time of 13 minutes. Under real-time ultrasound guidance, the right IJ vein was accessed with a 21 gauge micropuncture needle; the needle tip within the vein was confirmed with ultrasound image documentation. Needle exchanged over the 018 guidewire for transitional dilator, which allowed advancement of a Benson wire into the IVC. Over this, an MPA catheter was advanced. A Palindrome 23 hemodialysis catheter was tunneled from the right anterior chest wall approach to the right IJ dermatotomy site. The MPA catheter was exchanged over an Amplatz wire for serial vascular dilators which allow placement of a peel-away sheath, through which the catheter was advanced under intermittent fluoroscopy, positioned with its tips in the proximal and midright atrium. Spot chest radiograph confirms good catheter position. No pneumothorax. Catheter was flushed and primed per protocol. Catheter secured externally with O Prolene sutures. The right IJ dermatotomy site was closed with Dermabond. COMPLICATIONS: COMPLICATIONS None immediate FLUOROSCOPY TIME:  36 seconds; 6 mGy COMPARISON:  None IMPRESSION: 1. Technically successful placement of tunneled right IJ hemodialysis catheter with ultrasound and fluoroscopic guidance. Ready for routine use. ACCESS: Remains approachable for percutaneous intervention as needed. Electronically Signed   By: Lucrezia Europe M.D.   On: 05/31/2020 16:44   IR US Guide Vasc Access Right  Result Date: 06/01/2020 CLINICAL DATA:  Renal failure, needs dialysis access EXAM: TUNNELED HEMODIALYSIS CATHETER PLACEMENT WITH ULTRASOUND AND FLUOROSCOPIC GUIDANCE TECHNIQUE: The procedure, risks, benefits, and alternatives were explained to the patient. Questions regarding the procedure were encouraged and answered. The patient understands and consents to the procedure. As antibiotic prophylaxis, vancomycin 1 g was ordered pre-procedure and administered  intravenously within one hour of incision.Patency of the right IJ vein was confirmed with ultrasound with image documentation. An appropriate skin site was determined. Region was prepped using maximum barrier technique including cap and mask, sterile gown, sterile gloves, large sterile sheet, and Chlorhexidine as cutaneous antisepsis. The region was infiltrated locally with 1% lidocaine. Intravenous Fentanyl 55mcg and Versed 1mg  were administered as conscious sedation during continuous monitoring of the patient's level of consciousness and physiological / cardiorespiratory status by the radiology RN, with a total moderate sedation time of 13 minutes. Under real-time ultrasound guidance, the right IJ vein was accessed with a 21 gauge micropuncture needle; the needle tip within the vein was confirmed with ultrasound image documentation. Needle exchanged over the 018 guidewire for transitional dilator, which allowed advancement of a Benson wire into the IVC. Over this, an MPA catheter was advanced. A Palindrome 23 hemodialysis catheter was tunneled from the right anterior chest wall approach to the right IJ dermatotomy site. The MPA catheter was exchanged over an Amplatz wire for serial vascular dilators which allow placement of a peel-away sheath, through which the catheter was advanced under intermittent fluoroscopy, positioned with its tips in the proximal and midright atrium. Spot chest radiograph confirms good catheter position. No pneumothorax. Catheter was flushed and primed per protocol. Catheter secured externally with O Prolene sutures. The right IJ dermatotomy site was closed with Dermabond. COMPLICATIONS: COMPLICATIONS None immediate FLUOROSCOPY TIME:  36 seconds; 6 mGy COMPARISON:  None IMPRESSION: 1. Technically successful placement of tunneled right IJ hemodialysis catheter with ultrasound and fluoroscopic guidance. Ready for routine use. ACCESS: Remains approachable for percutaneous intervention as  needed. Electronically Signed   By: Lucrezia Europe M.D.   On: 05/31/2020 16:44    Labs: BMET Recent Labs  Lab 05/27/20 0529 05/28/20 5436 05/29/20 0629 05/30/20 0827 05/31/20 0751 06/01/20 0453 06/02/20 0404  NA 135 133* 137 137 137 138 136  K 5.2* 4.9 3.9 4.0 4.0 3.8 4.4  CL 109 104 102 102 102 104 102  CO2 11* 14* 21* 19* 18* 19* 24  GLUCOSE 246* 257* 176* 129* 125* 260* 235*  BUN 121* 137* 129* 158* 172* 135* 103*  CREATININE 8.23* 8.23* 8.67* 8.64* 9.31* 7.47* 6.15*  CALCIUM 8.4* 8.2* 7.9* 7.9* 7.7* 7.7* 7.7*  PHOS 9.3* 9.1* 9.9* 10.5* 10.0* 7.8* 6.0*   CBC Recent Labs  Lab 05/30/20 0558 05/31/20 0751 06/01/20 0637 06/02/20 0404  WBC 7.8 6.7 7.1 7.2  NEUTROABS  --   --   --  5.5  HGB 10.3* 10.1* 9.9* 9.3*  HCT 31.2* 30.5* 29.7* 28.9*  MCV 86.9 87.1 86.1 86.8  PLT 142* 149* 144* 112*    Medications:    . amLODipine  10 mg Oral Daily  . apixaban  5 mg Oral BID  . atorvastatin  20 mg Oral Daily  . Chlorhexidine Gluconate Cloth  6 each Topical Q0600  . folic acid  1 mg Oral Daily  . hydrALAZINE  100 mg Oral TID  . insulin aspart  0-15 Units Subcutaneous Q4H  . insulin glargine  12 Units Subcutaneous BID  . metoprolol tartrate  100 mg Oral BID  . multivitamin with minerals  1 tablet Oral Daily  . pantoprazole  40 mg Oral BID  . prazosin  2 mg Oral QPC supper  . predniSONE  60 mg Oral Q breakfast  . sevelamer carbonate  800 mg Oral TID WC  . thiamine  100 mg Oral Daily   Or  . thiamine  100 mg Intravenous Daily   Justin Mend, MD 06/02/2020, 8:14 AM

## 2020-06-02 NOTE — Plan of Care (Signed)
Nutrition Education Note  RD consulted for Renal Education. Provided Renal Food Guide Pyramid to patient/family. Reviewed food groups and provided written recommended serving sizes specifically determined for patient's current nutritional status.   Explained why diet restrictions are needed and provided lists of foods to limit/avoid that are high potassium, sodium, and phosphorus. Provided specific recommendations on safer alternatives of these foods. Strongly encouraged compliance of this diet.   Discussed importance of protein intake at each meal and snack. Provided examples of how to maximize protein intake throughout the day. Discussed need for fluid restriction with dialysis, importance of minimizing weight gain between HD treatments, and renal-friendly beverage options.  Encouraged pt to discuss specific diet questions/concerns with RD at HD outpatient facility. Teach back method used.  Expect fair compliance.  Body mass index is 36.45 kg/m. Pt meets criteria for obesity based on current BMI.  Current diet order is renal/carb modified, patient is consuming approximately 95-100% of meals at this time. Labs and medications reviewed. No further nutrition interventions warranted at this time. RD contact information provided. If additional nutrition issues arise, please re-consult RD.  Larkin Ina, MS, RD, LDN RD pager number and weekend/on-call pager number located in [place].

## 2020-06-03 ENCOUNTER — Ambulatory Visit: Payer: Medicare Other | Admitting: Cardiology

## 2020-06-03 LAB — RENAL FUNCTION PANEL
Albumin: 2.6 g/dL — ABNORMAL LOW (ref 3.5–5.0)
Anion gap: 8 (ref 5–15)
BUN: 76 mg/dL — ABNORMAL HIGH (ref 8–23)
CO2: 27 mmol/L (ref 22–32)
Calcium: 7.9 mg/dL — ABNORMAL LOW (ref 8.9–10.3)
Chloride: 101 mmol/L (ref 98–111)
Creatinine, Ser: 5.15 mg/dL — ABNORMAL HIGH (ref 0.61–1.24)
GFR calc Af Amer: 13 mL/min — ABNORMAL LOW (ref >60)
GFR calc non Af Amer: 11 mL/min — ABNORMAL LOW (ref >60)
Glucose, Bld: 245 mg/dL — ABNORMAL HIGH (ref 70–99)
Phosphorus: 4.9 mg/dL — ABNORMAL HIGH (ref 2.5–4.6)
Potassium: 4.1 mmol/L (ref 3.5–5.1)
Sodium: 136 mmol/L (ref 135–145)

## 2020-06-03 LAB — GLUCOSE, CAPILLARY
Glucose-Capillary: 225 mg/dL — ABNORMAL HIGH (ref 70–99)
Glucose-Capillary: 312 mg/dL — ABNORMAL HIGH (ref 70–99)
Glucose-Capillary: 306 mg/dL — ABNORMAL HIGH (ref 70–99)
Glucose-Capillary: 291 mg/dL — ABNORMAL HIGH (ref 70–99)

## 2020-06-03 LAB — PARATHYROID HORMONE, INTACT (NO CA): PTH: 74 pg/mL — ABNORMAL HIGH (ref 15–65)

## 2020-06-03 MED ORDER — INSULIN ASPART 100 UNIT/ML ~~LOC~~ SOLN
0.0000 [IU] | Freq: Three times a day (TID) | SUBCUTANEOUS | Status: DC
  Administered 2020-06-03: 5 [IU] via SUBCUTANEOUS
  Administered 2020-06-03 (×2): 11 [IU] via SUBCUTANEOUS
  Administered 2020-06-04: 5 [IU] via SUBCUTANEOUS
  Administered 2020-06-04: 11 [IU] via SUBCUTANEOUS
  Administered 2020-06-05: 3 [IU] via SUBCUTANEOUS

## 2020-06-03 MED ORDER — INSULIN GLARGINE 100 UNIT/ML ~~LOC~~ SOLN
20.0000 [IU] | Freq: Two times a day (BID) | SUBCUTANEOUS | Status: DC
  Administered 2020-06-03 – 2020-06-05 (×5): 20 [IU] via SUBCUTANEOUS
  Filled 2020-06-03 (×6): qty 0.2

## 2020-06-03 MED ORDER — CYCLOPHOSPHAMIDE 25 MG PO CAPS
100.0000 mg | ORAL_CAPSULE | Freq: Every day | ORAL | Status: DC
  Administered 2020-06-03 – 2020-06-05 (×3): 100 mg via ORAL
  Filled 2020-06-03: qty 4
  Filled 2020-06-03: qty 2
  Filled 2020-06-03 (×2): qty 4

## 2020-06-03 MED ORDER — INSULIN ASPART 100 UNIT/ML ~~LOC~~ SOLN
0.0000 [IU] | Freq: Every day | SUBCUTANEOUS | Status: DC
  Administered 2020-06-03: 3 [IU] via SUBCUTANEOUS
  Administered 2020-06-04: 4 [IU] via SUBCUTANEOUS

## 2020-06-03 NOTE — Progress Notes (Signed)
PROGRESS NOTE    Alexander [name]  JTT:[id number] DOB: Feb 01, 1961 DOA: 05/23/2020 PCP: [name], MD   Brief Narrative: 59 year old with past medical history significant for A. fib on Eliquis, recent right TKA/daily, CAD status post PTCA 2015, PVD, CKD stage IV baseline creatinine 2.5--3 followed by Dr. Justin Mend who presented to the ED for evaluation of black tarry stool for 2 days.  Denies taking any iron or Pepto-Bismol.  Patient has had a prior endoscopy and colonoscopy 2015 that was unremarkable.  He was also found to have AKI on CKD stage IV.  GI and nephrology were consulted.  Underwent endoscopy 7/13 2021, colonoscopy 05/25/2020 and IR guided renal biopsy on 05/26/2020.  Renal function progressively continued to worsen so eventually patient had tunneled hemodialysis catheter on 05/31/2020 and started on HD.  Per Nephrology note preliminary biopsy report consistent with diabetic nephropathy with nodular glomerulosclerosis and rare extra capillary proliferation interpreted to present pseudocrescents.  Focal acute tubular injury with intratubular red blood cell casts moderate to severe interstitial fibrosis and tubular atrophy.  Awaiting final biopsy report.   For his chronic stump wound, [name] was consulted.  No further recommendations other than continuing to use a shrinker.  Assessment & Plan:   Principal Problem:   GI bleeding Active Problems:   PAD (peripheral artery disease) (HCC)   Acute renal failure (HCC)   CAD (coronary artery disease), native coronary artery   Essential hypertension, benign   Acute hematogenous osteomyelitis of right foot (HCC)   Acute GI bleeding   Occult blood in stools   Melena  1-GI, Duodenitis;  Underwent endoscopy and colonoscopy.  Endoscopy showed duodenitis.  Likely a potential source for GI bleed. Colonoscopy showed no acute bleeding. Continue with PPI twice daily. Okay to resume anticoagulation per GI. Hb remain stable.  Patient has not  required any transfusion during this hospitalization.  2-AKI on stage IV CKD: Baseline creatinine 2.5--3 due to diabetic nephropathy but admitted with creatinine of 6.78. BUN elevation in part due to GI bleed.  Creatinine peaked at 9.31 on 05/31/2020.  Status post renal biopsy 05/26/2020, final report pending.  Had [place] on 05/31/2020 with first HD session. Patient received high-dose IV steroid, now a steroid has been tapering off.  Appreciate nephrology assistance.  Hyperkalemia: recieved lokelma. Resolved.   Metabolic acidosis, normal anion gap; Mild.  Now on HD so will defer to nephrology.  CAD status post PTCA 2015, PVD, hyperlipidemia, status post PPM; Treated with metoprolol, Lipitor, hydralazine  Uncontrolled hypertension/hypertension urgency Blood pressure now much better controlled.  Continue amlodipine, Lopressor and hydralazine and Imdur 15 mg.  PAF; Continue with metoprolol/Eliquis.  PAD s/p BKA 4/30 Wound care consulted. Continue with daily dressing changes.  Dr [name] was informed of patient admission. He will see patient today.  Type 2 diabetes uncontrolled hyperglycemia: Hemoglobin A1c 8.6.  Blood sugar still slightly elevated.  Will increase Lantus to 20 units twice daily and continue SSI.  Thrombocytopenia: Mild. Monitor  Morbid obesity: Weight loss counseling provided.  Estimated body mass index is 36.45 kg/m as calculated from the following:   Height as of this encounter: 6' (1.829 m).   Weight as of this encounter: 121.9 kg.   DVT prophylaxis: Heparin Code Status: Full code Family Communication: Care discussed with patient Disposition Plan:  Status is: Inpatient  Remains inpatient appropriate because:Persistent severe electrolyte disturbances and Ongoing active pain requiring inpatient pain management now on HD.   Dispo:  Patient From: Home  Planned Disposition: Home  Expected discharge date: To be determined based on CLIP completed.  Medically stable  for discharge:yes  Consultants:   Nephrology  Procedures:   EGD 05/24/2020:  Impression: - Normal esophagus. - Normal stomach. - Duodenitis. - Normal third portion of the duodenum. - No specimens collected. Recommendation:- Return patient to hospital ward for ongoing care. - Clear liquid diet today. - Continue present medications including  pantoprazole 40 mg po bid. - Duodenitis is a potential source for blood loss.  Recommend performing a colonoscopy tomorrow and  will discuss with patient when he recovers from  sedation.   Antimicrobials:    Subjective; Seen and examined.  He has no complaints.  DeniesObjective: Vitals:   06/02/20 1616 06/02/20 2041 06/03/20 0506 06/03/20 0933  BP: (!) 158/65 (!) 161/62 (!) 149/99 (!) 154/73  Pulse: 66 64 73 64  Resp: 18 18 18 18   Temp: 98.6 F (37 C) 97.7 F (36.5 C) 98 F (36.7 C) 98 F (36.7 C)  TempSrc: Oral Oral Oral Oral  SpO2: 97% 98% 97% 98%  Weight:  (!) 121.9 kg    Height:        Intake/Output Summary (Last 24 hours) at 06/03/2020 1229 Last data filed at 06/03/2020 0506 Gross per 24 hour  Intake 480 ml  Output 600 ml  Net -120 ml   Filed Weights   06/02/20 0645 06/02/20 0953 06/02/20 2041  Weight: 122.9 kg 121.9 kg (!) 121.9 kg    Examination:  General exam: Appears calm and comfortable  Respiratory system: Clear to auscultation. Respiratory effort normal. Cardiovascular system: S1 & S2 heard, RRR. No JVD, murmurs, rubs, gallops or clicks.  +1 pitting edema left lower extremity. Gastrointestinal system: Abdomen is nondistended, soft and nontender. No organomegaly or masses felt. Normal bowel sounds heard. Central nervous  system: Alert and oriented. No focal neurological deficits. Extremities: Right BKA. Skin: No rashes, lesions or ulcers.  Psychiatry: Judgement and insight appear normal. Mood & affect appropriate.    Data Reviewed: I have personally reviewed following labs and imaging studies  CBC: Recent Labs  Lab 05/29/20 0629 05/30/20 0558 05/31/20 0751 06/01/20 0637 06/02/20 0404  WBC 7.7 7.8 6.7 7.1 7.2  NEUTROABS  --   --   --   --  5.5  HGB 9.9* 10.3* 10.1* 9.9* 9.3*  HCT 30.4* 31.2* 30.5* 29.7* 28.9*  MCV 88.4 86.9 87.1 86.1 86.8  PLT 174 142* 149* 144* 841*   Basic Metabolic Panel: Recent Labs  Lab 05/30/20 0827 05/31/20 0751 06/01/20 0453 06/02/20 0404 06/03/20 0712  NA 137 137 138 136 136  K 4.0 4.0 3.8 4.4 4.1  CL 102 102 104 102 101  CO2 19* 18* 19* 24 27  GLUCOSE 129* 125* 260* 235* 245*  BUN 158* 172* 135* 103* 76*  CREATININE 8.64* 9.31* 7.47* 6.15* 5.15*  CALCIUM 7.9* 7.7* 7.7* 7.7* 7.9*  PHOS 10.5* 10.0* 7.8* 6.0* 4.9*   GFR: Estimated Creatinine Clearance: 20.3 mL/min (A) (by C-G formula based on SCr of 5.15 mg/dL (H)). Liver Function Tests: Recent Labs  Lab 05/30/20 0827 05/31/20 0751 06/01/20 0453 06/02/20 0404 06/03/20 0712  ALBUMIN 3.1* 2.7* 2.5* 2.6* 2.6*   No results for input(s): LIPASE, AMYLASE in the last 168 hours. No results for input(s): AMMONIA in the last 168 hours. Coagulation Profile: No results for input(s): INR, PROTIME in the last 168 hours. Cardiac Enzymes: No results for input(s): CKTOTAL, CKMB, CKMBINDEX, TROPONINI in the last 168 hours. BNP (last 3 results) No results for  input(s): PROBNP in the last 8760 hours. HbA1C: No results for input(s): HGBA1C in the last 72 hours. CBG: Recent Labs  Lab 06/02/20 1146 06/02/20 1617 06/02/20 2041 06/03/20 0719 06/03/20 1118  GLUCAP 253* 316* 237* 225* 312*   Lipid Profile: No results for input(s): CHOL, HDL, LDLCALC, TRIG, CHOLHDL, LDLDIRECT in the last 72 hours. Thyroid Function  Tests: No results for input(s): TSH, T4TOTAL, FREET4, T3FREE, THYROIDAB in the last 72 hours. Anemia Panel: No results for input(s): VITAMINB12, FOLATE, FERRITIN, TIBC, IRON, RETICCTPCT in the last 72 hours. Sepsis Labs: No results for input(s): PROCALCITON, LATICACIDVEN in the last 168 hours.  No results found for this or any previous visit (from the past 240 hour(s)).       Radiology Studies: No results found.      Scheduled Meds: . amLODipine  10 mg Oral Daily  . apixaban  5 mg Oral BID  . atorvastatin  20 mg Oral Daily  . Chlorhexidine Gluconate Cloth  6 each Topical Q0600  . cyclophosphamide  100 mg Oral Daily  . folic acid  1 mg Oral Daily  . hydrALAZINE  100 mg Oral TID  . insulin aspart  0-15 Units Subcutaneous TID WC  . insulin aspart  0-5 Units Subcutaneous QHS  . insulin glargine  20 Units Subcutaneous BID  . isosorbide mononitrate  15 mg Oral Daily  . metoprolol tartrate  100 mg Oral BID  . multivitamin with minerals  1 tablet Oral Daily  . pantoprazole  40 mg Oral BID  . prazosin  2 mg Oral QPC supper  . predniSONE  60 mg Oral Q breakfast  . sevelamer carbonate  800 mg Oral TID WC  . thiamine  100 mg Oral Daily   Or  . thiamine  100 mg Intravenous Daily   Continuous Infusions:   LOS: 11 days    Time spent: 28 minutes.   Darliss Cheney, MD [facility]  If 7PM-7AM, please contact night-coverage [email or web address]  06/03/2020, 12:29 PM

## 2020-06-03 NOTE — TOC Initial Note (Signed)
Transition of Care [facility]) - Initial/Assessment Note    Patient Details  Name: Alexander [name] MRN: [id number] Date of Birth: Aug 07, 1961  Transition of Care [facility]) CM/SW Contact:    Bartholomew Crews, RN Phone Number: [phone] 06/03/2020, 5:04 PM  Clinical Narrative:                  Spoke with patient at the bedside to discuss transition planning. PTA home with spouse and 2 adult daughters. He states that he no longer receives [facility] services. He is in the process of getting a prosthesis for his leg. He has DME in the home to include wheelchair, walker, and 3N1. He states that he is independent with his self care.   Discussed outpatient hemodialysis to begin Tuesday. Patient states that his family will make sure that he gets to dialysis and other medical appointments. He reports no problems traveling in private vehicle.   Discussed medication -cytoxan. This medication can be difficult to fill at [facility]. NCM called [facility] outpatient pharmacy to verify in [place]. Saturday hours from 8-4:30am, Closed on Sunday. Patient states that his family can pick up medication. If patient discharges on Sunday, medication can be sent to pharmacy and patient will be able to pick up on Monday.   [facility] referral placed for outpatient community case management.   TOC following for transition needs.   Expected Discharge Plan: Home/Self Care Barriers to Discharge: Continued Medical Work up   Patient Goals and CMS Choice Patient states their goals for this hospitalization and ongoing recovery are:: home with wife CMS Medicare.gov Compare Post Acute Care list provided to:: Patient Choice offered to / list presented to : Patient  Expected Discharge Plan and Services Expected Discharge Plan: Home/Self Care In-house Referral: [facility], Clinical Social Work Discharge Planning Services: CM Consult Post Acute Care Choice: Dialysis Living arrangements for the past 2 months: Single Family Home                 DME  Arranged: N/A DME Agency: NA       HH Arranged: NA [place] Agency: NA        Prior Living Arrangements/Services Living arrangements for the past 2 months: [place] Lives with:: Self, Spouse Patient language and need for interpreter reviewed:: Yes Do you feel safe going back to the place where you live?: Yes      Need for Family Participation in Patient Care: Yes (Comment) Care giver support system in place?: Yes (comment) Current home services: DME (wheelchair, walker, 3N1) Criminal Activity/Legal Involvement Pertinent to Current Situation/Hospitalization: No - Comment as needed  Activities of Daily Living Home Assistive Devices/Equipment: Wheelchair, Eyeglasses, CBG Meter ADL Screening (condition at time of admission) Patient's cognitive ability adequate to safely complete daily activities?: Yes Is the patient deaf or have difficulty hearing?: No Does the patient have difficulty seeing, even when wearing glasses/contacts?: No Does the patient have difficulty concentrating, remembering, or making decisions?: No Patient able to express need for assistance with ADLs?: Yes Does the patient have difficulty dressing or bathing?: No Independently performs ADLs?: No Communication: Independent Dressing (OT): Independent Grooming: Independent Feeding: Independent Bathing: Independent Toileting: Needs assistance (patient waiting for stump to heal-using a wheelchair) Is this a change from baseline?: Pre-admission baseline In/Out Bed: Needs assistance Is this a change from baseline?: Pre-admission baseline Walks in Home: Dependent Is this a change from baseline?: Pre-admission baseline Does the patient have difficulty walking or climbing stairs?: Yes Weakness of Legs: Left (right aka)  Weakness of Arms/Hands: None  Permission Sought/Granted Permission sought to share information with : Case Manager Permission granted to share information with : Yes, Verbal Permission  Granted              Emotional Assessment Appearance:: Appears stated age Attitude/Demeanor/Rapport: Engaged Affect (typically observed): Accepting, Flat Orientation: : Oriented to Self, Oriented to  Time, Oriented to Place, Oriented to Situation Alcohol / Substance Use: Not Applicable Psych Involvement: No (comment)  Admission diagnosis:  GI bleeding [K92.2] Acute GI bleeding [K92.2] Lower GI bleed [K92.2] AKI (acute kidney injury) ([place]) [N17.9] Acute renal failure, unspecified acute renal failure type [facility]) [N17.9] Patient Active Problem List   Diagnosis Date Noted  . Occult blood in stools   . Melena   . GI bleeding 05/23/2020  . Acute GI bleeding 05/23/2020  . Acute hematogenous osteomyelitis of right foot ([place]) 03/11/2020  . Gangrene of right foot ([place])   . Diabetic foot ulcer associated with type 2 diabetes mellitus ([place]) 02/19/2020  . Heel ulcer ([place]) 11/05/2019  . Atherosclerotic PVD with ulceration ([place]) 09/09/2017  . Essential hypertension, benign 02/07/2015  . Renal insufficiency 02/07/2015  . Post PTCA 11/09/2014  . S/P PTCA (percutaneous transluminal coronary angioplasty) 11/09/2014  . Pacemaker: [place] DR MRI [id number] Dual chamber pacemaker 10/08/2014 10/08/2014  . Encounter for care of pacemaker 10/08/2014  . Sinus node dysfunction ([place]) 10/08/2014  . Cardiac asystole ([place]) 10/07/2014  . Cardiac syncope 10/07/2014  . Diabetes mellitus with stage 3 chronic kidney disease ([place]) 10/07/2014  . CAD (coronary artery disease), native coronary artery 10/07/2014  . Diabetes mellitus due to underlying condition without complications ([place]) [id number]/[id number]  . Anemia, unspecified 05/25/2014  . Syncope 04/30/2014  . Atherosclerosis of native arteries of the extremities with gangrene ([place]) 12/16/2013  . Volume overload 10/30/2013  . Acute renal failure ([place]) 10/28/2013  . PAD (peripheral artery disease) ([place]) 10/27/2013  . Leukocytosis 10/21/2013  . Anemia  10/21/2013  . Osteomyelitis of left foot ([place]) 08/19/2013  . Spinal stenosis, lumbar region, with neurogenic claudication 06/12/2012    Class: Diagnosis of  . DIABETES MELLITUS, TYPE II 03/15/2009  . ALCOHOL ABUSE 03/15/2009  . TOBACCO USE 03/15/2009  . HYPERTENSION 03/15/2009   PCP:  [name], MD Pharmacy:   [facility] [address], [place] [address] Phone: [phone] Fax: [phone]  [place], [place] [address] Phone: [phone] Fax: [phone]     Social Determinants of Health ([place]) Interventions    Readmission Risk Interventions Readmission Risk Prevention Plan 05/26/2020 03/15/2020  Transportation Screening Complete Complete  PCP or Specialist Appt within 5-7 Days - Complete  Home Care Screening - Complete  Medication Review (RN CM) - Complete  Medication Review ([place]) Complete -  PCP or Specialist appointment within 3-5 days of discharge Complete -  [place] or Home Care Consult Complete -  SW Recovery Care/Counseling Consult Complete -  Palliative Care Screening Not Applicable -  [place] Not Applicable -  Some recent data might be hidden

## 2020-06-03 NOTE — Progress Notes (Signed)
Dr. Singh/Nephrologist confirmed patient's needs OP HD treatment for AKI. Navigator spoke with [name] clinic Manager, who states he has been accepted on a TTS schedule with a seat time of 11:30am. Unfortunately, she does not have availability to start him tomorrow. Clinic will be prepared to start patient on Tuesday, 06/07/20. Dr. Candiss Norse updated.  Navigator met with patient to discuss clinic/schedule. He needs to arrive to his treatments at 11:10am. He needs to arrive to the clinic at 10:45am on 06/07/20 in order to sign intake paperwork prior to his first treatment. He states understanding and again reports no issues with transportation.   Alexander [name], [place] Renal Navigator [phone]

## 2020-06-03 NOTE — Progress Notes (Addendum)
Patient ID: Alexander [name], male   DOB: [birth year], 59 y.o.   MRN: [id number] [facility] Progress Note   Assessment/ Plan:   1. 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  Empirically on corticosteroids. Hepatitis nonreactive.  Had first HD on 7/20 after tunneled catheter with IR.   -rediscussed with [facility] nephropathology 7/22: ~50% IF/TA, many RBC casts (atypical of DM nephropathy). With this data along with updates on ANCA/PR3 serologies will give him a chance and start immunosuppression. - HD on 7/21 as well as 7/22 then assess needs daily, plan for HD tomorrow if still here  - for now on pred 60 mg daily; plan for 6 month taper. Continue with 60mg  daily for 1 month then will plan on taper to 50mg  daily thereafter -will start cytoxan 100mg  daily ( 0.8mg /kg/day). will tentatively plan for a 3 month duration of treatment and reassess for any form of renal recovery (can extend treatment up to 6 months if needed). Can utilize ritux for flares if needed moving forward (hep panel and quant panel done) - ordered renal DM diet - Discussed with Colleen re: CLIP as AKI --> [place] closed to new pt, [facility] referral made, may be able to get a chair for tomorrow  2.  GI bleed/melena: Suspected to be secondary to duodenitis based on EGD/colonoscopy and currently on PPI.  He is hemodynamically stable and without indications for PRBC transfusion.  3.  Hypertension: increased amlodipine to 10 mg daily.  He missed multiple meds on 7/20 including the amlodipine and hydralazine doses.  Follow on current regimen for now.  Lisinopril and furosemide  currently on hold.  4.  Peripheral vascular disease status post right BKA with intact dressing.  5. Metabolic acidosis - stopped bicarbonate; now on on HD  6. Metabolic bone disease/Hyperphosphatemia - on HD and started renvela. Pth=74 7/22  7.  Type 2 diabetes mellitus: Insulin-dependent and likely the predominating etiology of his underlying chronic kidney disease.  If able to get a chair at [place] starting tomorrow, then okay for discharge from nephrology perspective. Otherwise will dialyze him tomorrow and anticipate plans for d/c over the weekend.   Subjective:   Tolerated hd yesterday. Wants to go home badly. Denies fevers, chest pain, sob, rash, epistaxis, hemoptysis.    Objective:   BP (!) 154/73 (BP Location: Left Arm)   Pulse 64   Temp 98 F (36.7 C) (Oral)   Resp 18   Ht 6' (1.829 m)   Wt (!) 121.9 kg   SpO2 98%   BMI 36.45 kg/m   Intake/Output Summary (Last 24 hours) at 06/03/2020 1404 Last data filed at 06/03/2020 0506 Gross per 24 hour  Intake 480 ml  Output 600 ml  Net -120 ml   Weight change: -1 kg  Physical Exam:   Gen: adult male in bed in NAD   CVS: S1 and S2 no rub Resp: Clear to auscultation, no rales/rhonchi; unlabored at rest  Abd: Soft, obese, nontender Ext: trace edema. Right leg status post BKA pysch normal mood and affect Neuro  Alert and oriented x 3 provides hx and follows commands Access: RIJ tunneled catheter: c/d/i  Imaging: No results found.  Labs: BMET Recent  Labs  Lab 05/28/20 2947 05/29/20 0629 05/30/20 0827 05/31/20 0751 06/01/20 0453 06/02/20 0404 06/03/20 0712  NA 133* 137 137 137 138 136 136  K 4.9 3.9 4.0 4.0 3.8 4.4 4.1  CL 104 102 102 102 104 102 101  CO2 14* 21* 19* 18* 19* 24 27  GLUCOSE 257* 176* 129* 125* 260* 235* 245*  BUN 137* 129* 158* 172* 135* 103* 76*  CREATININE 8.23* 8.67* 8.64* 9.31* 7.47* 6.15* 5.15*  CALCIUM 8.2* 7.9* 7.9* 7.7* 7.7* 7.7* 7.9*  PHOS 9.1* 9.9* 10.5* 10.0* 7.8* 6.0* 4.9*    CBC Recent Labs  Lab 05/30/20 0558 05/31/20 0751 06/01/20 0637 06/02/20 0404  WBC 7.8 6.7 7.1 7.2  NEUTROABS  --   --   --  5.5  HGB 10.3* 10.1* 9.9* 9.3*  HCT 31.2* 30.5* 29.7* 28.9*  MCV 86.9 87.1 86.1 86.8  PLT 142* 149* 144* 112*    Medications:    . amLODipine  10 mg Oral Daily  . apixaban  5 mg Oral BID  . atorvastatin  20 mg Oral Daily  . Chlorhexidine Gluconate Cloth  6 each Topical Q0600  . cyclophosphamide  100 mg Oral Daily  . folic acid  1 mg Oral Daily  . hydrALAZINE  100 mg Oral TID  . insulin aspart  0-15 Units Subcutaneous TID WC  . insulin aspart  0-5 Units Subcutaneous QHS  . insulin glargine  20 Units Subcutaneous BID  . isosorbide mononitrate  15 mg Oral Daily  . metoprolol tartrate  100 mg Oral BID  . multivitamin with minerals  1 tablet Oral Daily  . pantoprazole  40 mg Oral BID  . prazosin  2 mg Oral QPC supper  . predniSONE  60 mg Oral Q breakfast  . sevelamer carbonate  800 mg Oral TID WC  . thiamine  100 mg Oral Daily   Or  . thiamine  100 mg Intravenous Daily   Gean Quint, MD 06/03/2020, 2:04 PM

## 2020-06-04 DIAGNOSIS — N058 Unspecified nephritic syndrome with other morphologic changes: Secondary | ICD-10-CM

## 2020-06-04 DIAGNOSIS — Z992 Dependence on renal dialysis: Secondary | ICD-10-CM

## 2020-06-04 LAB — RENAL FUNCTION PANEL
Albumin: 2.6 g/dL — ABNORMAL LOW (ref 3.5–5.0)
Anion gap: 12 (ref 5–15)
BUN: 94 mg/dL — ABNORMAL HIGH (ref 8–23)
CO2: 20 mmol/L — ABNORMAL LOW (ref 22–32)
Calcium: 7.8 mg/dL — ABNORMAL LOW (ref 8.9–10.3)
Chloride: 102 mmol/L (ref 98–111)
Creatinine, Ser: 5.85 mg/dL — ABNORMAL HIGH (ref 0.61–1.24)
GFR calc Af Amer: 11 mL/min — ABNORMAL LOW (ref >60)
GFR calc non Af Amer: 10 mL/min — ABNORMAL LOW (ref >60)
Glucose, Bld: 377 mg/dL — ABNORMAL HIGH (ref 70–99)
Phosphorus: 4.9 mg/dL — ABNORMAL HIGH (ref 2.5–4.6)
Potassium: 4.6 mmol/L (ref 3.5–5.1)
Sodium: 134 mmol/L — ABNORMAL LOW (ref 135–145)

## 2020-06-04 LAB — CBC
HCT: 30.2 % — ABNORMAL LOW (ref 39.0–52.0)
Hemoglobin: 9.8 g/dL — ABNORMAL LOW (ref 13.0–17.0)
MCH: 28.7 pg (ref 26.0–34.0)
MCHC: 32.5 g/dL (ref 30.0–36.0)
MCV: 88.6 fL (ref 80.0–100.0)
Platelets: 114 10*3/uL — ABNORMAL LOW (ref 150–400)
RBC: 3.41 MIL/uL — ABNORMAL LOW (ref 4.22–5.81)
RDW: 14 % (ref 11.5–15.5)
WBC: 10.4 10*3/uL (ref 4.0–10.5)
nRBC: 0 % (ref 0.0–0.2)

## 2020-06-04 LAB — GLUCOSE, CAPILLARY
Glucose-Capillary: 308 mg/dL — ABNORMAL HIGH (ref 70–99)
Glucose-Capillary: 209 mg/dL — ABNORMAL HIGH (ref 70–99)
Glucose-Capillary: 346 mg/dL — ABNORMAL HIGH (ref 70–99)
Glucose-Capillary: 310 mg/dL — ABNORMAL HIGH (ref 70–99)

## 2020-06-04 MED ORDER — SEVELAMER CARBONATE 800 MG PO TABS: 800.0000 mg | ORAL_TABLET | Freq: Three times a day (TID) | ORAL | 0 refills | Status: AC

## 2020-06-04 MED ORDER — HEPARIN SODIUM (PORCINE) 1000 UNIT/ML IJ SOLN
INTRAMUSCULAR | Status: AC
  Administered 2020-06-04: 3800 [IU] via INTRAVENOUS_CENTRAL
  Filled 2020-06-04: qty 4

## 2020-06-04 MED ORDER — SODIUM CHLORIDE 0.9 % IV SOLN: 100.0000 mL | INTRAVENOUS | Status: DC | PRN

## 2020-06-04 MED ORDER — PENTAFLUOROPROP-TETRAFLUOROETH EX AERO: 1.0000 "application " | INHALATION_SPRAY | CUTANEOUS | Status: DC | PRN

## 2020-06-04 MED ORDER — ATOVAQUONE 750 MG/5ML PO SUSP: 1500.0000 mg | Freq: Every day | ORAL | 0 refills | Status: DC

## 2020-06-04 MED ORDER — ISOSORBIDE MONONITRATE ER 30 MG PO TB24: 30.0000 mg | ORAL_TABLET | Freq: Every day | ORAL | 0 refills | Status: DC

## 2020-06-04 MED ORDER — HEPARIN SODIUM (PORCINE) 1000 UNIT/ML DIALYSIS: 1000.0000 [IU] | INTRAMUSCULAR | Status: DC | PRN

## 2020-06-04 MED ORDER — AMLODIPINE BESYLATE 10 MG PO TABS: 10.0000 mg | ORAL_TABLET | Freq: Every day | ORAL | 0 refills | Status: DC

## 2020-06-04 MED ORDER — ATOVAQUONE 750 MG/5ML PO SUSP
1500.0000 mg | Freq: Every day | ORAL | Status: DC
  Filled 2020-06-04: qty 10

## 2020-06-04 MED ORDER — SULFAMETHOXAZOLE-TRIMETHOPRIM 400-80 MG PO TABS
1.0000 | ORAL_TABLET | ORAL | Status: DC
  Administered 2020-06-04: 1 via ORAL
  Filled 2020-06-04: qty 1

## 2020-06-04 MED ORDER — SULFAMETHOXAZOLE-TRIMETHOPRIM 400-80 MG PO TABS: 1.0000 | ORAL_TABLET | Freq: Two times a day (BID) | ORAL | Status: DC

## 2020-06-04 MED ORDER — ALTEPLASE 2 MG IJ SOLR: 2.0000 mg | Freq: Once | INTRAMUSCULAR | Status: DC | PRN

## 2020-06-04 MED ORDER — PANTOPRAZOLE SODIUM 40 MG PO TBEC: 40.0000 mg | DELAYED_RELEASE_TABLET | Freq: Two times a day (BID) | ORAL | 0 refills | Status: DC

## 2020-06-04 MED ORDER — ISOSORBIDE MONONITRATE ER 30 MG PO TB24
30.0000 mg | ORAL_TABLET | Freq: Every day | ORAL | Status: DC
  Administered 2020-06-04 – 2020-06-05 (×2): 30 mg via ORAL
  Filled 2020-06-04 (×2): qty 1

## 2020-06-04 MED ORDER — FOLIC ACID 1 MG PO TABS: 1.0000 mg | ORAL_TABLET | Freq: Every day | ORAL | 0 refills | Status: AC

## 2020-06-04 MED ORDER — CYCLOPHOSPHAMIDE 50 MG PO CAPS: 100.0000 mg | ORAL_CAPSULE | Freq: Every day | ORAL | 0 refills | Status: AC

## 2020-06-04 MED ORDER — LIDOCAINE-PRILOCAINE 2.5-2.5 % EX CREA: 1.0000 "application " | TOPICAL_CREAM | CUTANEOUS | Status: DC | PRN

## 2020-06-04 MED ORDER — PREDNISONE 20 MG PO TABS: 60.0000 mg | ORAL_TABLET | Freq: Every day | ORAL | 0 refills | Status: AC

## 2020-06-04 MED ORDER — LIDOCAINE HCL (PF) 1 % IJ SOLN: 5.0000 mL | INTRAMUSCULAR | Status: DC | PRN

## 2020-06-04 MED FILL — SEVELAMER CARBONATE 800 MG: 800 | 30 days supply | Qty: 90 | Fill #0

## 2020-06-04 MED FILL — predniSONE 20 MG TABS: 20 | 30 days supply | Qty: 90 | Fill #0

## 2020-06-04 MED FILL — ISOSORBIDE MONONITRATE ER 3: 30 | 30 days supply | Qty: 30 | Fill #0

## 2020-06-04 MED FILL — FOLIC ACID 1 MG TABS: 1 | 30 days supply | Qty: 30 | Fill #0

## 2020-06-04 MED FILL — PANTOPRAZOLE SOD DR 40 MG T: 40 | 20 days supply | Qty: 40 | Fill #0

## 2020-06-04 MED FILL — AMLODIPINE BESYLATE 10 MG T: 10 | 30 days supply | Qty: 30 | Fill #0

## 2020-06-04 NOTE — Progress Notes (Signed)
PROGRESS NOTE    Alexander [name]  RFX:[id number] DOB: [birth year] DOA: 05/23/2020 PCP: [name], MD   Brief Narrative: 59 year old with past medical history significant for A. fib on Eliquis, recent right TKA/daily, CAD status post PTCA 2015, PVD, CKD stage IV baseline creatinine 2.5--3 followed by Dr. Justin Mend who presented to the ED for evaluation of black tarry stool for 2 days.  Denies taking any iron or Pepto-Bismol.  Patient has had a prior endoscopy and colonoscopy 2015 that was unremarkable.  He was also found to have AKI on CKD stage IV.  GI and nephrology were consulted.  Underwent endoscopy 7/13 2021, colonoscopy 05/25/2020 and IR guided renal biopsy on 05/26/2020.  Renal function progressively continued to worsen so eventually patient had tunneled hemodialysis catheter on 05/31/2020 and started on HD.  Per Nephrology note preliminary biopsy report consistent with diabetic nephropathy with nodular glomerulosclerosis and rare extra capillary proliferation interpreted to present pseudocrescents.  Focal acute tubular injury with intratubular red blood cell casts moderate to severe interstitial fibrosis and tubular atrophy.  Awaiting final biopsy report.   For his chronic stump wound, [name] was consulted.  No further recommendations other than continuing to use a shrinker.  Assessment & Plan:   Principal Problem:   Upper GI bleeding Active Problems:   PAD (peripheral artery disease) (HCC)   Acute renal failure (HCC)   Diabetes mellitus due to underlying condition without complications (HCC)   CAD (coronary artery disease), native coronary artery   Essential hypertension, benign   Hemodialysis patient ([place])   Necrotizing glomerulonephritis  1-GI, Duodenitis;  Underwent endoscopy and colonoscopy.  Endoscopy showed duodenitis.  Likely a potential source for GI bleed. Colonoscopy showed no acute bleeding. Continue with PPI twice daily. Okay to resume anticoagulation per GI. Hb  remain stable.  Patient has not required any transfusion during this hospitalization.  2-AKI on stage IV CKD: Baseline creatinine 2.5--3 due to diabetic nephropathy but admitted with creatinine of 6.78. BUN elevation in part due to GI bleed.  Creatinine peaked at 9.31 on 05/31/2020.  Status post renal biopsy 05/26/2020, Had [facility] on 05/31/2020 with first HD session. Patient received high-dose IV steroid, now a steroid has been tapering off.  Per nephrology note, his final pathology report shows focal necrotizing glomerulonephritis.  They recommend prednisone along with Cytoxan and multivitamins.  Needs PCP prophylaxis but allergic to sulfa.  They recommended Mepron however cost is a barrier which patient cannot afford.  He is willing to try Bactrim as he believes that he likely does not have any sulfa allergy as mentioned in nephrology's note today.  In order to give a test dose, he is also willing to stay today.  Nephrology has placed the orders already.  Hyperkalemia: recieved lokelma. Resolved.   Metabolic acidosis, normal anion gap; Mild.  Now on HD so will defer to nephrology.  CAD status post PTCA 2015, PVD, hyperlipidemia, status post PPM; Treated with metoprolol, Lipitor, hydralazine  Uncontrolled hypertension/hypertension urgency Blood pressure now much better controlled.  Continue amlodipine, Lopressor and hydralazine and Imdur 15 mg.  PAF; Continue with metoprolol/Eliquis.  PAD s/p BKA 4/30 Wound care consulted. Continue with daily dressing changes.  Dr [name] was informed of patient admission. He will see patient today.  Type 2 diabetes uncontrolled hyperglycemia: Hemoglobin A1c 8.6.  Blood sugar still slightly elevated.  Will increase Lantus to 20 units twice daily and continue SSI.  Thrombocytopenia: Mild. Monitor  Morbid obesity: Weight loss counseling provided.  Estimated body mass  index is 36.12 kg/m as calculated from the following:   Height as of this encounter: 6' (1.829  m).   Weight as of this encounter: 120.8 kg.   DVT prophylaxis: Heparin Code Status: Full code Family Communication: Care discussed with patient Disposition Plan:  Status is: Inpatient  Remains inpatient appropriate because:Persistent severe electrolyte disturbances and Ongoing active pain requiring inpatient pain management now on HD.   Dispo:  Patient From: Home  Planned Disposition: Home  Expected discharge date: 06/05/2020  Medically stable for discharge:yes  Consultants:   Nephrology  Procedures:   EGD 05/24/2020:  Impression: - Normal esophagus. - Normal stomach. - Duodenitis. - Normal third portion of the duodenum. - No specimens collected. Recommendation:- Return patient to hospital ward for ongoing care. - Clear liquid diet today. - Continue present medications including  pantoprazole 40 mg po bid. - Duodenitis is a potential source for blood loss.  Recommend performing a colonoscopy tomorrow and  will discuss with patient when he recovers from  sedation.   Antimicrobials:    Subjective; Seen and examined in dialysis unit.  No complaints as usual.  DeniesObjective: Vitals:   06/04/20 1000 06/04/20 1030 06/04/20 1050 06/04/20 1146  BP: (!) 154/76 (!) 143/80 (!) 162/97 (!) 165/78  Pulse: 80 77 84 71  Resp: 17 16 17 18   Temp:   97.6 F (36.4 C) 98 F (36.7 C)  TempSrc:   Oral Oral  SpO2:   98% 98%  Weight:   (!) 120.8 kg   Height:        Intake/Output Summary (Last 24 hours) at 06/04/2020 1333 Last data filed at 06/04/2020 1050 Gross per 24 hour  Intake 600 ml  Output 2600 ml  Net -2000 ml   Filed Weights   06/02/20 2041 06/04/20 0714 06/04/20 1050  Weight:  (!) 121.9 kg (!) 122.9 kg (!) 120.8 kg    Examination:  General exam: Appears calm and comfortable  Respiratory system: Clear to auscultation. Respiratory effort normal. Cardiovascular system: S1 & S2 heard, RRR. No JVD, murmurs, rubs, gallops or clicks.  Left lower extremity +1 pitting edema Gastrointestinal system: Abdomen is nondistended, soft and nontender. No organomegaly or masses felt. Normal bowel sounds heard. Central nervous system: Alert and oriented. No focal neurological deficits. Extremities: Right BKA Skin: No rashes, lesions or ulcers.  Psychiatry: Judgement and insight appear normal. Mood & affect appropriate.    Data Reviewed: I have personally reviewed following labs and imaging studies  CBC: Recent Labs  Lab 05/30/20 0558 05/31/20 0751 06/01/20 0637 06/02/20 0404 06/04/20 0730  WBC 7.8 6.7 7.1 7.2 10.4  NEUTROABS  --   --   --  5.5  --   HGB 10.3* 10.1* 9.9* 9.3* 9.8*  HCT 31.2* 30.5* 29.7* 28.9* 30.2*  MCV 86.9 87.1 86.1 86.8 88.6  PLT 142* 149* 144* 112* 742*   Basic Metabolic Panel: Recent Labs  Lab 05/31/20 0751 06/01/20 0453 06/02/20 0404 06/03/20 0712 06/04/20 0241  NA 137 138 136 136 134*  K 4.0 3.8 4.4 4.1 4.6  CL 102 104 102 101 102  CO2 18* 19* 24 27 20*  GLUCOSE 125* 260* 235* 245* 377*  BUN 172* 135* 103* 76* 94*  CREATININE 9.31* 7.47* 6.15* 5.15* 5.85*  CALCIUM 7.7* 7.7* 7.7* 7.9* 7.8*  PHOS 10.0* 7.8* 6.0* 4.9* 4.9*   GFR: Estimated Creatinine Clearance: 17.8 mL/min (A) (by C-G formula based on SCr of 5.85 mg/dL (H)). Liver Function Tests: Recent Labs  Lab 05/31/20 [phone]  06/01/20 0453 06/02/20 0404 06/03/20 0712 06/04/20 0241  ALBUMIN 2.7* 2.5* 2.6* 2.6* 2.6*   No results for input(s): LIPASE, AMYLASE in the last 168 hours. No results for input(s): AMMONIA in the last 168 hours. Coagulation Profile: No results for input(s): INR, PROTIME in the last 168 hours. Cardiac Enzymes: No results for input(s): CKTOTAL, CKMB,  CKMBINDEX, TROPONINI in the last 168 hours. BNP (last 3 results) No results for input(s): PROBNP in the last 8760 hours. HbA1C: No results for input(s): HGBA1C in the last 72 hours. CBG: Recent Labs  Lab 06/03/20 1118 06/03/20 1654 06/03/20 2058 06/04/20 0641 06/04/20 1144  GLUCAP 312* 306* 291* 308* 209*   Lipid Profile: No results for input(s): CHOL, HDL, LDLCALC, TRIG, CHOLHDL, LDLDIRECT in the last 72 hours. Thyroid Function Tests: No results for input(s): TSH, T4TOTAL, FREET4, T3FREE, THYROIDAB in the last 72 hours. Anemia Panel: No results for input(s): VITAMINB12, FOLATE, FERRITIN, TIBC, IRON, RETICCTPCT in the last 72 hours. Sepsis Labs: No results for input(s): PROCALCITON, LATICACIDVEN in the last 168 hours.  No results found for this or any previous visit (from the past 240 hour(s)).       Radiology Studies: No results found.      Scheduled Meds: . amLODipine  10 mg Oral Daily  . apixaban  5 mg Oral BID  . atorvastatin  20 mg Oral Daily  . Chlorhexidine Gluconate Cloth  6 each Topical Q0600  . cyclophosphamide  100 mg Oral Daily  . folic acid  1 mg Oral Daily  . hydrALAZINE  100 mg Oral TID  . insulin aspart  0-15 Units Subcutaneous TID WC  . insulin aspart  0-5 Units Subcutaneous QHS  . insulin glargine  20 Units Subcutaneous BID  . isosorbide mononitrate  30 mg Oral Daily  . metoprolol tartrate  100 mg Oral BID  . multivitamin with minerals  1 tablet Oral Daily  . pantoprazole  40 mg Oral BID  . prazosin  2 mg Oral QPC supper  . predniSONE  60 mg Oral Q breakfast  . sevelamer carbonate  800 mg Oral TID WC  . sulfamethoxazole-trimethoprim  1 tablet Oral Once per day on Tue Thu Sat  . thiamine  100 mg Oral Daily   Or  . thiamine  100 mg Intravenous Daily   Continuous Infusions:   LOS: 12 days    Time spent: 30 minutes.   Darliss Cheney, MD [facility]  If 7PM-7AM, please contact night-coverage [email or web address]  06/04/2020, 1:33 PM

## 2020-06-04 NOTE — Discharge Instructions (Signed)

## 2020-06-04 NOTE — Discharge Summary (Addendum)
Physician Discharge Summary  Alexander [name]:[id number] DOB: [birth year] DOA: 05/23/2020  PCP: [name], MD  Admit date: 05/23/2020 Discharge date: 06/05/2020  Admitted From: Home Disposition: Home  Recommendations for Outpatient Follow-up:  1. Follow up with PCP in 1-2 weeks 2. Follow with nephrology as outpatient 3. Please obtain BMP/CBC in one week 4. Please follow up with your PCP on the following pending results: Unresulted Labs (From admission, onward) Comment          Start     Ordered   06/03/20 0500  QuantiFERON-TB Gold Plus  Tomorrow morning,   R       Question:  Specimen collection method  Answer:  Lab=Lab collect   06/02/20 0820   05/27/20 0500  Renal function panel  Daily,   R     Question:  Specimen collection method  Answer:  Lab=Lab collect   05/26/20 [place]: None Equipment/Devices: None  Discharge Condition: Stable CODE STATUS: Full code Diet recommendation: Low-sodium  Subjective: Seen and examined in his room with nephrologist.  He has no complaints.  He wants to go home today.  Brief/Interim Summary: 59 year old with past medical history significant for A. fib on Eliquis, recent right TKA/daily, CAD status post PTCA 2015, PVD, CKD stage IV baseline creatinine 2.5--3 followed by Dr. Justin Mend who presented to the ED for evaluation of black tarry stool for 2 days.  Denies taking any iron or Pepto-Bismol.  Patient has had a prior endoscopy and colonoscopy 2015 that was unremarkable.  He was also found to have AKI on CKD stage IV.  GI and nephrology were consulted.  Underwent endoscopy 7/13 2021, colonoscopy 05/25/2020 and IR guided renal biopsy on 05/26/2020.  Renal function progressively continued to worsen so eventually patient had tunneled hemodialysis catheter on 05/31/2020 and started on HD.  Per Nephrology note preliminary biopsy report consistent with diabetic nephropathy with nodular glomerulosclerosis and rare extra capillary  proliferation interpreted to present pseudocrescents.  Focal acute tubular injury with intratubular red blood cell casts moderate to severe interstitial fibrosis and tubular atrophy.  Awaiting final biopsy report.   For his chronic stump wound, [name] was consulted.  No further recommendations other than continuing to use a shrinker.  Please note that patient was initially going to be discharged on 06/04/2020 however there were 2 barriers to his discharge yesterday.  One was the fact that there was a high co-pay for it to work when more than $200 per month which patient was unable to afford.  After further inquiry, patient informed us that he was given Bactrim DS in 2015 and for that he developed only a small rash on his abdomen which resolved and he continued his medication without any side effect so he was willing to take a chance and take Bactrim DS.  For that reason, nephrology decided to hold his discharge and give a trial of Bactrim.  He took that without any side effects.  Other reason for hold off his discharge was the fact that he needed prior authorization for Cytoxan however his insurance company was closed on the weekend for that to be done.  Discussed case with nephrology today.  Per them, patient is fine with going home right now without any Cytoxan and that nephrology's office will do prior authorization on Monday or Tuesday and get back to the patient.  In the meantime he will be discharged on prednisone alone. Overall, his atovaquone  is being canceled and he is going home on Bactrim single strength TTS schedule now and he will get Cytoxan once his prior authorization will be completed by nephrology clinic.  Also, his blood pressure was still elevated so his Lopressor was increased today from 100 mg twice daily to 150 mg twice daily.  [facility] is closed today.  I have left them a voicemail regarding this patient and asked them to cancel his prior Lopressor of 100 mg and  dispense 150 mg twice daily and cancel his atovaquone and dispense Bactrim DS.  1-GI, Duodenitis;  Underwent endoscopy and colonoscopy.  Endoscopy showed duodenitis.  Likely a potential source for GI bleed. Colonoscopy showed no acute bleeding. Continue with PPI twice daily for total of 30 days, that means another 20 days. Okay to resume anticoagulation per GI. Hb remain stable.  Patient has not required any transfusion during this hospitalization.  2-AKI on stage IV CKD/concern for possible necrotizing GN: Baseline creatinine 2.5--3 due to diabetic nephropathy but admitted with creatinine of 6.78. BUN elevation in part due to GI bleed.  Creatinine peaked at 9.31 on 05/31/2020.  Status post renal biopsy 05/26/2020 which is suspicious of necrotizing glomerulonephritis. per nephrology note, Final path is more in line of ANCA associated etiology/pauci immune focal necrotizing GN with less than 10% segmental cellular crescent formation): Marland Kitchen  Had [facility] catheter on 05/31/2020 with first HD session. Patient received high-dose IV steroid, now on 60 mg p.o. daily being discharged on that with nephrology to taper over the course of next 6 months.  They also recommend PCP prophylaxis with atovaquone 1.5 mg daily (patient has sulfa allergy), GI prophylaxis with PPI, Renvela , folic acid and Cytoxan 100 mg p.o. daily.  Hyperkalemia: recieved lokelma. Resolved.   Metabolic acidosis, normal anion gap; Mild.  Now on HD and resolved.  CAD status post PTCA 2015, PVD, hyperlipidemia, status post PPM; Treated with metoprolol, Lipitor, hydralazine  Uncontrolled hypertension/hypertension urgency Blood pressure remained uncontrolled here so multiple medications were adjusted and currently he is on high dose of amlodipine, hydralazine, beta-blocker and 60 mg of Imdur.  PAF; Continue with metoprolol/Eliquis.  PAD s/p BKA 4/30 Wound care consulted. Continue with daily dressing changes.  Dr [name] was informed of  patient admission. He will see patient today.  Type 2 diabetes uncontrolled hyperglycemia: Blood sugar remained elevated here.  Will resume his home dosage of 70/30.  Thrombocytopenia: Mild. Monitor  Morbid obesity: Weight loss counseling provided.  Discharge Diagnoses:  Principal Problem:   Upper GI bleeding Active Problems:   PAD (peripheral artery disease) (HCC)   Acute renal failure (HCC)   Diabetes mellitus due to underlying condition without complications (HCC)   CAD (coronary artery disease), native coronary artery   Essential hypertension, benign   Hemodialysis patient [facility])   Necrotizing glomerulonephritis    Discharge Instructions  Discharge Instructions    Discharge patient   Complete by: As directed    Discharge disposition: 01-Home or Self Care   Discharge patient date: 06/05/2020     Allergies as of 06/05/2020      Reactions   Keflex [cephalexin] Nausea And Vomiting   Pletal [cilostazol] Palpitations   "Can hear heart beating loudly".   Sulfa Antibiotics Rash      Medication List    STOP taking these medications   furosemide 40 MG tablet Commonly known as: LASIX   lisinopril 10 MG tablet Commonly known as: ZESTRIL     TAKE these medications  Accu-Chek Advantage Diabetes kit Use as instructed   Accu-Chek FastClix Lancets Misc Check blood sugar TID & QHS   amLODipine 10 MG tablet Commonly known as: NORVASC Take 1 tablet (10 mg total) by mouth daily.   apixaban 5 MG Tabs tablet Commonly known as: Eliquis Take 1 tablet (5 mg total) by mouth 2 (two) times daily. DO NOT TAKE THIS MEDICATION UNTIL WE RESTART IT AFTER YOUR SURGERY.   aspirin 81 MG EC tablet Take 81 mg by mouth daily.   atorvastatin 20 MG tablet Commonly known as: LIPITOR Take 1 tablet by mouth once daily   blood glucose meter kit and supplies Kit Check blood sugar TID & QHS   cyclophosphamide 50 MG capsule Commonly known as: CYTOXAN Take 2 capsules (100 mg total)  by mouth daily. Take with food to minimize GI upset. Take early in the day and maintain hydration.   folic acid 1 MG tablet Commonly known as: FOLVITE Take 1 tablet (1 mg total) by mouth daily.   glucose blood test strip Use as instructed   glucose blood test strip Commonly known as: Accu-Chek Aviva Plus Use as instructed   glucose blood test strip Commonly known as: Choice DM Fora G20 Test Strips Check blood sugar TID & QHS   hydrALAZINE 100 MG tablet Commonly known as: APRESOLINE TAKE 1 TABLET BY MOUTH THREE TIMES DAILY What changed: when to take this   insulin NPH-regular Human (70-30) 100 UNIT/ML injection Inject 46-56 Units into the skin daily with breakfast.   isosorbide mononitrate 30 MG 24 hr tablet Commonly known as: IMDUR Take 1 tablet (30 mg total) by mouth daily.   Metoprolol Tartrate 75 MG Tabs Take 150 mg by mouth 2 (two) times daily. What changed:   medication strength  how much to take   pantoprazole 40 MG tablet Commonly known as: PROTONIX Take 1 tablet (40 mg total) by mouth 2 (two) times daily for 20 days.   prazosin 2 MG capsule Commonly known as: MINIPRESS TAKE 1 CAPSULE BY MOUTH IN THE EVENING AFTER SUPPER What changed: See the new instructions.   predniSONE 20 MG tablet Commonly known as: DELTASONE Take 3 tablets (60 mg total) by mouth daily with breakfast.   sevelamer carbonate 800 MG tablet Commonly known as: RENVELA Take 1 tablet (800 mg total) by mouth 3 (three) times daily with meals.   sulfamethoxazole-trimethoprim 400-80 MG tablet Commonly known as: BACTRIM Take 1 tablet by mouth 3 (three) times a week. Start taking on: June 07, 2020       Follow-up Information    Tisovec, [name], MD Follow up in 1 week(s).   Specialty: Internal Medicine Contact information: [place] [address] [phone]              Allergies  Allergen Reactions  . Keflex [Cephalexin] Nausea And Vomiting  . Pletal  [Cilostazol] Palpitations    "Can hear heart beating loudly".  . Sulfa Antibiotics Rash    Consultations: Nephrology and IR   Procedures/Studies: US RENAL  Result Date: 05/23/2020 CLINICAL DATA:  Initial evaluation for acute renal insufficiency. EXAM: RENAL / URINARY TRACT ULTRASOUND COMPLETE COMPARISON:  None. FINDINGS: Right Kidney: Renal measurements: 13.1 x 6.4 x 8.7 cm = volume: 370.2 mL. Diffusely increased echogenicity within the renal parenchyma with mild diffuse cortical thinning. No nephrolithiasis or hydronephrosis. No focal renal mass. Left Kidney: Renal measurements: 13.7 x 7.1 x 7.1 cm = volume: 361.6 mL. Increased echogenicity within the renal parenchyma with  mild diffuse cortical thinning. No nephrolithiasis or hydronephrosis. No focal renal mass. Bladder: Appears normal for degree of bladder distention. Other: None. IMPRESSION: 1. Diffusely increased echogenicity within the renal parenchyma with underlying mild diffuse cortical thinning, consistent with chronic medical renal disease. 2. No hydronephrosis or other significant finding. Electronically Signed   By: Jeannine Boga M.D.   On: 05/23/2020 21:30   IR Fluoro Guide CV Line Right  Result Date: 05/31/2020 CLINICAL DATA:  Renal failure, needs dialysis access EXAM: TUNNELED HEMODIALYSIS CATHETER PLACEMENT WITH ULTRASOUND AND FLUOROSCOPIC GUIDANCE TECHNIQUE: The procedure, risks, benefits, and alternatives were explained to the patient. Questions regarding the procedure were encouraged and answered. The patient understands and consents to the procedure. As antibiotic prophylaxis, vancomycin 1 g was ordered pre-procedure and administered intravenously within one hour of incision.Patency of the right IJ vein was confirmed with ultrasound with image documentation. An appropriate skin site was determined. Region was prepped using maximum barrier technique including cap and mask, sterile gown, sterile gloves, large sterile sheet,  and Chlorhexidine as cutaneous antisepsis. The region was infiltrated locally with 1% lidocaine. Intravenous Fentanyl 54mg and Versed '1mg'$  were administered as conscious sedation during continuous monitoring of the patient's level of consciousness and physiological / cardiorespiratory status by the radiology RN, with a total moderate sedation time of 13 minutes. Under real-time ultrasound guidance, the right IJ vein was accessed with a 21 gauge micropuncture needle; the needle tip within the vein was confirmed with ultrasound image documentation. Needle exchanged over the 018 guidewire for transitional dilator, which allowed advancement of a Benson wire into the IVC. Over this, an MPA catheter was advanced. A Palindrome 23 hemodialysis catheter was tunneled from the right anterior chest wall approach to the right IJ dermatotomy site. The MPA catheter was exchanged over an Amplatz wire for serial vascular dilators which allow placement of a peel-away sheath, through which the catheter was advanced under intermittent fluoroscopy, positioned with its tips in the proximal and midright atrium. Spot chest radiograph confirms good catheter position. No pneumothorax. Catheter was flushed and primed per protocol. Catheter secured externally with O Prolene sutures. The right IJ dermatotomy site was closed with Dermabond. COMPLICATIONS: COMPLICATIONS None immediate FLUOROSCOPY TIME:  36 seconds; 6 mGy COMPARISON:  None IMPRESSION: 1. Technically successful placement of tunneled right IJ hemodialysis catheter with ultrasound and fluoroscopic guidance. Ready for routine use. ACCESS: Remains approachable for percutaneous intervention as needed. Electronically Signed   By: DLucrezia EuropeM.D.   On: 05/31/2020 16:44   IR [place]Guide Vasc Access Right  Result Date: 06/01/2020 CLINICAL DATA:  Renal failure, needs dialysis access EXAM: TUNNELED HEMODIALYSIS CATHETER PLACEMENT WITH ULTRASOUND AND FLUOROSCOPIC GUIDANCE TECHNIQUE: The  procedure, risks, benefits, and alternatives were explained to the patient. Questions regarding the procedure were encouraged and answered. The patient understands and consents to the procedure. As antibiotic prophylaxis, vancomycin 1 g was ordered pre-procedure and administered intravenously within one hour of incision.Patency of the right IJ vein was confirmed with ultrasound with image documentation. An appropriate skin site was determined. Region was prepped using maximum barrier technique including cap and mask, sterile gown, sterile gloves, large sterile sheet, and Chlorhexidine as cutaneous antisepsis. The region was infiltrated locally with 1% lidocaine. Intravenous Fentanyl 533m and Versed '1mg'$  were administered as conscious sedation during continuous monitoring of the patient's level of consciousness and physiological / cardiorespiratory status by the radiology RN, with a total moderate sedation time of 13 minutes. Under real-time ultrasound guidance, the right IJ vein was  accessed with a 21 gauge micropuncture needle; the needle tip within the vein was confirmed with ultrasound image documentation. Needle exchanged over the 018 guidewire for transitional dilator, which allowed advancement of a Benson wire into the IVC. Over this, an MPA catheter was advanced. A Palindrome 23 hemodialysis catheter was tunneled from the right anterior chest wall approach to the right IJ dermatotomy site. The MPA catheter was exchanged over an Amplatz wire for serial vascular dilators which allow placement of a peel-away sheath, through which the catheter was advanced under intermittent fluoroscopy, positioned with its tips in the proximal and midright atrium. Spot chest radiograph confirms good catheter position. No pneumothorax. Catheter was flushed and primed per protocol. Catheter secured externally with O Prolene sutures. The right IJ dermatotomy site was closed with Dermabond. COMPLICATIONS: COMPLICATIONS None  immediate FLUOROSCOPY TIME:  36 seconds; 6 mGy COMPARISON:  None IMPRESSION: 1. Technically successful placement of tunneled right IJ hemodialysis catheter with ultrasound and fluoroscopic guidance. Ready for routine use. ACCESS: Remains approachable for percutaneous intervention as needed. Electronically Signed   By: Lucrezia Europe M.D.   On: 05/31/2020 16:44   DG CHEST PORT 1 VIEW  Result Date: 05/24/2020 CLINICAL DATA:  Atrial fibrillation. EXAM: PORTABLE CHEST 1 VIEW COMPARISON:  October 09, 2014 FINDINGS: Lungs are clear. Heart is borderline enlarged with pacemaker leads attached to right atrium and right ventricle. Pulmonary vascularity is normal. No adenopathy. No bone lesions. No pneumothorax. IMPRESSION: Heart borderline enlarged. Pacemaker leads attached to right atrium and right ventricle. Lungs clear. Electronically Signed   By: Lowella Grip III M.D.   On: 05/24/2020 10:12   ECHOCARDIOGRAM COMPLETE  Result Date: 05/24/2020    ECHOCARDIOGRAM REPORT   Patient Name:   CAETANO OBERHAUS [facility] Date of Exam: 05/24/2020 Medical Rec #:  [id number]     Height:       72.0 in Accession #:    [id number]    Weight:       270.0 lb Date of Birth:  [birth year]     BSA:          2.420 m Patient Age:    58 years      BP:           190/75 mmHg Patient Gender: M             HR:           66 bpm. Exam Location:  Inpatient Procedure: 2D Echo, Color Doppler, Cardiac Doppler and Intracardiac            Opacification Agent Indications:    R60.0 Lower extremity edema  History:        Patient has prior history of Echocardiogram examinations, most                 recent 10/08/2014. CAD, Pacemaker, Arrythmias:Atrial                 Fibrillation; Risk Factors:Hypertension and Diabetes.  Sonographer:    Raquel Sarna Senior RDCS Referring Phys: [id number] [place]  1. Left ventricular ejection fraction, by estimation, is 60 to 65%. The left ventricle has normal function. The left ventricle has no regional wall motion abnormalities.  There is severe concentric left ventricular hypertrophy. Left ventricular diastolic  parameters are consistent with Grade II diastolic dysfunction (pseudonormalization).  2. Right ventricular systolic function is normal. The right ventricular size is normal. There is mildly elevated pulmonary artery systolic pressure.  3. Left atrial size was moderately dilated.  4. Right atrial size was moderately dilated.  5. The mitral valve is normal in structure. Mild mitral valve regurgitation. No evidence of mitral stenosis.  6. The aortic valve is tricuspid. Aortic valve regurgitation is not visualized. No aortic stenosis is present.  7. The inferior vena cava is normal in size with greater than 50% respiratory variability, suggesting right atrial pressure of 3 mmHg. Conclusion(s)/Recommendation(s): Normal biventricular function without evidence of hemodynamically significant valvular heart disease. FINDINGS  Left Ventricle: Left ventricular ejection fraction, by estimation, is 60 to 65%. The left ventricle has normal function. The left ventricle has no regional wall motion abnormalities. Definity contrast agent was given IV to delineate the left ventricular  endocardial borders. The left ventricular internal cavity size was normal in size. There is severe concentric left ventricular hypertrophy. Left ventricular diastolic parameters are consistent with Grade II diastolic dysfunction (pseudonormalization). Right Ventricle: The right ventricular size is normal. No increase in right ventricular wall thickness. Right ventricular systolic function is normal. There is mildly elevated pulmonary artery systolic pressure. The tricuspid regurgitant velocity is 3.03  m/s, and with an assumed right atrial pressure of 3 mmHg, the estimated right ventricular systolic pressure is 03.8 mmHg. Left Atrium: Left atrial size was moderately dilated. Right Atrium: Right atrial size was moderately dilated. Pericardium: There is no evidence of  pericardial effusion. Mitral Valve: The mitral valve is normal in structure. Mild mitral valve regurgitation. No evidence of mitral valve stenosis. Tricuspid Valve: The tricuspid valve is normal in structure. Tricuspid valve regurgitation is mild . No evidence of tricuspid stenosis. Aortic Valve: The aortic valve is tricuspid. Aortic valve regurgitation is not visualized. No aortic stenosis is present. Pulmonic Valve: The pulmonic valve was grossly normal. Pulmonic valve regurgitation is trivial. No evidence of pulmonic stenosis. Aorta: The aortic root, ascending aorta and aortic arch are all structurally normal, with no evidence of dilitation or obstruction. Venous: The inferior vena cava is normal in size with greater than 50% respiratory variability, suggesting right atrial pressure of 3 mmHg. IAS/Shunts: The atrial septum is grossly normal. Additional Comments: A pacer wire is visualized in the right atrium and right ventricle.  LEFT VENTRICLE PLAX 2D LVIDd:         4.96 cm  Diastology LVIDs:         2.63 cm  LV e' lateral:   7.83 cm/s LV PW:         1.62 cm  LV E/e' lateral: 15.7 LV IVS:        2.28 cm  LV e' medial:    6.53 cm/s LVOT diam:     2.30 cm  LV E/e' medial:  18.8 LV SV:         116 LV SV Index:   48 LVOT Area:     4.15 cm  RIGHT VENTRICLE RV S prime:     15.20 cm/s TAPSE (M-mode): 2.5 cm LEFT ATRIUM              Index       RIGHT ATRIUM           Index LA diam:        3.60 cm  1.49 cm/m  RA Area:     24.60 cm LA Vol (A2C):   142.0 ml 58.67 ml/m RA Volume:   80.60 ml  33.30 ml/m LA Vol (A4C):   98.9 ml  40.86 ml/m LA Biplane Vol: 123.0 ml 50.82 ml/m  AORTIC VALVE LVOT Vmax:   113.00 cm/s  LVOT Vmean:  75.900 cm/s LVOT VTI:    0.280 m  AORTA Ao Root diam: 3.70 cm Ao Asc diam:  3.40 cm MITRAL VALVE                TRICUSPID VALVE MV Area (PHT): 4.80 cm     TR Peak grad:   36.7 mmHg MV Decel Time: 158 msec     TR Vmax:        303.00 cm/s MV E velocity: 123.00 cm/s MV A velocity: 84.40 cm/s    SHUNTS MV E/A ratio:  1.46         Systemic VTI:  0.28 m                             Systemic Diam: 2.30 cm Buford Dresser MD Electronically signed by Buford Dresser MD Signature Date/Time: 05/24/2020/5:35:57 PM    Final    IR KIDNEY BIOPSY  Result Date: 05/26/2020 INDICATION: Proteinuria and worsening renal function. Please perform ultrasound-guided renal biopsy for tissue diagnostic purposes. EXAM: ULTRASOUND GUIDED RENAL BIOPSY COMPARISON:  Renal ultrasound-05/23/2020 MEDICATIONS: None. ANESTHESIA/SEDATION: Fentanyl 100 mcg IV; Versed 2 mg IV Total Moderate Sedation time: 10 minutes; The patient was continuously monitored during the procedure by the interventional radiology nurse under my direct supervision. COMPLICATIONS: None immediate. PROCEDURE: Informed written consent was obtained from the patient after a discussion of the risks, benefits and alternatives to treatment. The patient understands and consents the procedure. A timeout was performed prior to the initiation of the procedure. Ultrasound scanning was performed of the bilateral flanks. The inferior pole of the right kidney was selected for biopsy due to location and sonographic window. The procedure was planned. The operative site was prepped and draped in the usual sterile fashion. The overlying soft tissues were anesthetized with 1% lidocaine with epinephrine. A 17 gauge core needle biopsy device was advanced into the inferior cortex of the right kidney and 2 core biopsies were obtained under direct ultrasound guidance. Images were saved for documentation purposes. The biopsy device was removed and hemostasis was obtained with manual compression. Post procedural scanning was negative for significant post procedural hemorrhage or additional complication. A dressing was placed. The patient tolerated the procedure well without immediate post procedural complication. IMPRESSION: Technically successful ultrasound guided right renal  biopsy. Electronically Signed   By: Sandi Mariscal M.D.   On: 05/26/2020 12:09     Discharge Exam: Vitals:   06/05/20 0452 06/05/20 0921  BP: (!) 154/77 (!) 156/69  Pulse: 76 74  Resp: 18 18  Temp: 98 F (36.7 C) 97.8 F (36.6 C)  SpO2: 96% 98%   Vitals:   06/04/20 2054 06/05/20 0452 06/05/20 0500 06/05/20 0921  BP: (!) 136/69 (!) 154/77  (!) 156/69  Pulse: 70 76  74  Resp: _0 Temp: 98.4 F (36.9 C) 98 F (36.7 C)  97.8 F (36.6 C)  TempSrc:  Oral  Oral  SpO2: 97% 96%  98%  Weight:   (!) 120.8 kg   Height:        General: Pt is alert, awake, not in acute distress Cardiovascular: RRR, S1/S2 +, no rubs, no gallops Respiratory: CTA bilaterally, no wheezing, no rhonchi Abdominal: Soft, NT, ND, bowel sounds + Extremities: Right BKA.  +1 pitting edema left lower extremity.    The results of significant diagnostics from this hospitalization (including imaging, microbiology, ancillary and laboratory) are listed below for reference.  Microbiology: No results found for this or any previous visit (from the past 240 hour(s)).   Labs: BNP (last 3 results) No results for input(s): BNP in the last 8760 hours. Basic Metabolic Panel: Recent Labs  Lab 05/31/20 0751 06/01/20 0453 06/02/20 0404 06/03/20 0712 06/04/20 0241  NA 137 138 136 136 134*  K 4.0 3.8 4.4 4.1 4.6  CL 102 104 102 101 102  CO2 18* 19* 24 27 20*  GLUCOSE 125* 260* 235* 245* 377*  BUN 172* 135* 103* 76* 94*  CREATININE 9.31* 7.47* 6.15* 5.15* 5.85*  CALCIUM 7.7* 7.7* 7.7* 7.9* 7.8*  PHOS 10.0* 7.8* 6.0* 4.9* 4.9*   Liver Function Tests: Recent Labs  Lab 05/31/20 0751 06/01/20 0453 06/02/20 0404 06/03/20 0712 06/04/20 0241  ALBUMIN 2.7* 2.5* 2.6* 2.6* 2.6*   No results for input(s): LIPASE, AMYLASE in the last 168 hours. No results for input(s): AMMONIA in the last 168 hours. CBC: Recent Labs  Lab 05/30/20 0558 05/31/20 0751 06/01/20 0637 06/02/20 0404 06/04/20 0730  WBC 7.8  6.7 7.1 7.2 10.4  NEUTROABS  --   --   --  5.5  --   HGB 10.3* 10.1* 9.9* 9.3* 9.8*  HCT 31.2* 30.5* 29.7* 28.9* 30.2*  MCV 86.9 87.1 86.1 86.8 88.6  PLT 142* 149* 144* 112* 114*   Cardiac Enzymes: No results for input(s): CKTOTAL, CKMB, CKMBINDEX, TROPONINI in the last 168 hours. BNP: Invalid input(s): POCBNP CBG: Recent Labs  Lab 06/04/20 0641 06/04/20 1144 06/04/20 1618 06/04/20 2052 06/05/20 0653  GLUCAP 308* 209* 346* 310* 191*   D-Dimer No results for input(s): DDIMER in the last 72 hours. Hgb A1c No results for input(s): HGBA1C in the last 72 hours. Lipid Profile No results for input(s): CHOL, HDL, LDLCALC, TRIG, CHOLHDL, LDLDIRECT in the last 72 hours. Thyroid function studies No results for input(s): TSH, T4TOTAL, T3FREE, THYROIDAB in the last 72 hours.  Invalid input(s): FREET3 Anemia work up No results for input(s): VITAMINB12, FOLATE, FERRITIN, TIBC, IRON, RETICCTPCT in the last 72 hours. Urinalysis    Component Value Date/Time   COLORURINE YELLOW 05/23/2020 2057   APPEARANCEUR CLEAR 05/23/2020 2057   LABSPEC 1.008 05/23/2020 2057   PHURINE 5.0 05/23/2020 2057   GLUCOSEU 50 (A) 05/23/2020 2057   HGBUR LARGE (A) 05/23/2020 2057   BILIRUBINUR NEGATIVE 05/23/2020 2057   BILIRUBINUR neg 10/01/2014 1445   KETONESUR NEGATIVE 05/23/2020 2057   PROTEINUR 100 (A) 05/23/2020 2057   UROBILINOGEN 0.2 10/01/2014 1445   UROBILINOGEN 0.2 09/30/2014 2051   NITRITE NEGATIVE 05/23/2020 2057   LEUKOCYTESUR NEGATIVE 05/23/2020 2057   Sepsis Labs Invalid input(s): PROCALCITONIN,  WBC,  LACTICIDVEN Microbiology No results found for this or any previous visit (from the past 240 hour(s)).   Time coordinating discharge: Over 30 minutes  SIGNED:   Darliss Cheney, MD  [facility] 06/05/2020, 10:53 AM  If 7PM-7AM, please contact night-coverage [email or web address]

## 2020-06-04 NOTE — TOC Progression Note (Signed)
Transition of Care [facility]) - Progression Note    Patient Details  Name: Alexander [name] MRN: [id number] Date of Birth: 09-Jun-1961  Transition of Care [facility]) CM/SW Contact  Bartholomew Crews, RN Phone Number: [phone] 06/04/2020, 5:59 PM  Clinical Narrative:     Patient was to transition home after HD today, however, barriers to obtaining discharge medications d/t weekend. Cyclophosphamide requires prior auth from medical not pharmacy, which cannot be done on the weekend. Additionally, patient had a copay of $222.62 for Mepron, which patient stated he could not afford. Discussed rationale for not using bactrim d/t sulfa allergy. Patient stated that in 2015 he had a little bit of a rash on his arm when he took a sulfa drug while getting hyperbaric treatments. Patient states it never got worse, and maybe would not react the same. Anticipate transition home on Monday.   Expected Discharge Plan: Home/Self Care Barriers to Discharge: Continued Medical Work up  Expected Discharge Plan and Services Expected Discharge Plan: Home/Self Care In-house Referral: [facility], Clinical Social Work Discharge Planning Services: CM Consult Post Acute Care Choice: Dialysis Living arrangements for the past 2 months: Single Family Home Expected Discharge Date: 06/04/20               DME Arranged: N/A DME Agency: NA       HH Arranged: NA HH Agency: NA         Social Determinants of Health (SDOH) Interventions    Readmission Risk Interventions Readmission Risk Prevention Plan 05/26/2020 03/15/2020  Transportation Screening Complete Complete  PCP or Specialist Appt within 5-7 Days - Complete  Home Care Screening - Complete  Medication Review (RN CM) - Complete  Medication Review Press photographer) Complete -  PCP or Specialist appointment within 3-5 days of discharge Complete -  [place] or Home Care Consult Complete -  SW Recovery Care/Counseling Consult Complete -  [place] Not Applicable -  Some recent data might be hidden

## 2020-06-04 NOTE — Progress Notes (Signed)
Patient ID: [name], male   DOB: February 25, 1961, 59 y.o.   MRN: [id number] [facility] Progress Note   Assessment/ Plan:   1. 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  Empirically on corticosteroids. Hepatitis nonreactive.  Had first HD on 7/20 after tunneled catheter with IR.   -rediscussed with [facility] nephropathology 7/22: ~50% IF/TA, many RBC casts (atypical of DM nephropathy). With this data along with updates on ANCA/PR3 serologies will give him a chance and start immunosuppression. Final path reviewed which is more in line of ANCA associated etiology/pauci immune focal necrotizing GN with less than 10% segmental cellular crescent formation) - no changes with dialysis today, tolerating treatment, will maintain on tts schdule - for now on pred 60 mg daily; plan for 6 month taper. Continue with 60mg  daily for 1 month then will plan on taper to 50mg  daily thereafter -cytoxan 100mg  daily ( 0.8mg /kg/day), started 06/03/2020. will tentatively plan for a 3 month duration of treatment and reassess for any form of renal recovery (can extend treatment up to 6 months if needed). Can utilize ritux for flares if needed moving forward (hep panel and quant panel done) -will start PCP prophylaxis with atovaquone 1.5g daily (sulfa allergy). GI ppx: keep on protonix - ordered renal DM diet - Discussed with Colleen re: CLIP as AKI --> Has a chair for this coming Tuesday at [facility], seat time: 11:30am. See below for discharge medications -okay from nephrology perspective for discharge, discussed with patient about follow up plan and  discharge medications. He is to call the office to set up an appointment otherwise will see [place] at the dialysis unit  2.  GI bleed/melena: Suspected to be secondary to duodenitis based on EGD/colonoscopy and currently on PPI.  He is hemodynamically stable and without indications for PRBC transfusion.  3.  Hypertension: increased amlodipine to 10 mg daily.  He missed multiple meds on 7/20 including the amlodipine and hydralazine doses.  Follow on current regimen for now.  Lisinopril and furosemide currently on hold.  4.  Peripheral vascular disease status post right BKA with intact dressing.  5. Metabolic acidosis - stopped bicarbonate; now on on HD  6. Metabolic bone disease/Hyperphosphatemia - on HD and started renvela. Pth=74 7/22  7.  Type 2 diabetes mellitus: Insulin-dependent and likely the predominating etiology of his underlying chronic kidney disease.  Medications from nephrology perspective to be discharged on: Cytoxan 100mg  daily Atovaquone 1.5g daily Protonix Prednisone 60mg  daily with over all 6 month taper Renvela 800mg  TIDAC Folic Acid 1mg  daily  Discussed with primary service.   Subjective:   Seen on HD, tolerating treatment, no complaints. Eager to go home. Reviewed discharge medications and immunosuppression with patient. Updated him on final pathology results.  3.5 hrs, BFR 400/DFR 800, F180, uf goal 2L, bp 166/80, hr67 RIJ tunneled dialysis catheter    Objective:   BP (!) 148/77   Pulse 70   Temp 98.2 F (36.8 C) (Oral)   Resp 14   Ht 6' (1.829 m)   Wt (!) 122.9 kg   SpO2 98%   BMI 36.75 kg/m   Intake/Output Summary (Last 24 hours) at 06/04/2020 0904 Last data filed at 06/04/2020 0600  Gross per 24 hour  Intake 840 ml  Output 600 ml  Net 240 ml   Weight change:   Physical Exam:   Gen: adult male in bed in NAD   CVS: S1 and S2 no rub Resp: Clear to auscultation, no rales/rhonchi; unlabored at rest  Abd: Soft, obese, nontender Ext: trace edema.  Right leg status post BKA pysch normal mood and affect Neuro  Alert and oriented x 3 provides hx and follows commands Access: RIJ tunneled catheter: c/d/i  Imaging: No results found.  Labs: BMET Recent Labs  Lab 05/29/20 0629 05/30/20 0827 05/31/20 0751 06/01/20 0453 06/02/20 0404 06/03/20 0712 06/04/20 0241  NA 137 137 137 138 136 136 134*  K 3.9 4.0 4.0 3.8 4.4 4.1 4.6  CL 102 102 102 104 102 101 102  CO2 21* 19* 18* 19* 24 27 20*  GLUCOSE 176* 129* 125* 260* 235* 245* 377*  BUN 129* 158* 172* 135* 103* 76* 94*  CREATININE 8.67* 8.64* 9.31* 7.47* 6.15* 5.15* 5.85*  CALCIUM 7.9* 7.9* 7.7* 7.7* 7.7* 7.9* 7.8*  PHOS 9.9* 10.5* 10.0* 7.8* 6.0* 4.9* 4.9*   CBC Recent Labs  Lab 05/31/20 0751 06/01/20 0637 06/02/20 0404 06/04/20 0730  WBC 6.7 7.1 7.2 10.4  NEUTROABS  --   --  5.5  --   HGB 10.1* 9.9* 9.3* 9.8*  HCT 30.5* 29.7* 28.9* 30.2*  MCV 87.1 86.1 86.8 88.6  PLT 149* 144* 112* 114*    Medications:    . amLODipine  10 mg Oral Daily  . apixaban  5 mg Oral BID  . atorvastatin  20 mg Oral Daily  . Chlorhexidine Gluconate Cloth  6 each Topical Q0600  . cyclophosphamide  100 mg Oral Daily  . folic acid  1 mg Oral Daily  . hydrALAZINE  100 mg Oral TID  . insulin aspart  0-15 Units Subcutaneous TID WC  . insulin aspart  0-5 Units Subcutaneous QHS  . insulin glargine  20 Units Subcutaneous BID  . isosorbide mononitrate  30 mg Oral Daily  . metoprolol tartrate  100 mg Oral BID  . multivitamin with minerals  1 tablet Oral Daily  . pantoprazole  40 mg Oral BID  . prazosin  2 mg Oral QPC supper  . predniSONE  60 mg Oral Q breakfast  . sevelamer carbonate  800 mg Oral TID WC  . thiamine  100 mg Oral Daily   Or  . thiamine  100 mg Intravenous Daily   Gean Quint, MD 06/04/2020, 9:04 AM

## 2020-06-04 NOTE — Treatment Plan (Signed)
Informed that cost is a barrier to obtaining mepron. Had a slight rash when he received bactrim years ago (2015) when he was in a hyperbaric chamber. Will give him a test dose today and if he tolerates it can proceed with discharge tomorrow. Orders placed, mepron d/c'ed.  Gean Quint, MD [facility]

## 2020-06-04 NOTE — Progress Notes (Signed)
Pt refuse to have new IV placed.  Pt had d/c home orders last IV removed.  MD aware.

## 2020-06-05 LAB — SURGICAL PATHOLOGY

## 2020-06-05 LAB — GLUCOSE, CAPILLARY: Glucose-Capillary: 191 mg/dL — ABNORMAL HIGH (ref 70–99)

## 2020-06-05 MED ORDER — SULFAMETHOXAZOLE-TRIMETHOPRIM 400-80 MG PO TABS: 1.0000 | ORAL_TABLET | ORAL | 0 refills | Status: AC

## 2020-06-05 MED ORDER — METOPROLOL TARTRATE 50 MG PO TABS
150.0000 mg | ORAL_TABLET | Freq: Two times a day (BID) | ORAL | Status: DC
  Administered 2020-06-05: 150 mg via ORAL
  Filled 2020-06-05: qty 1

## 2020-06-05 MED ORDER — METOPROLOL TARTRATE 75 MG PO TABS: 150.0000 mg | ORAL_TABLET | Freq: Two times a day (BID) | ORAL | 0 refills | Status: DC

## 2020-06-05 NOTE — Progress Notes (Signed)
Patient ID: NTHONY LEFFERTS, male   DOB: [birth year], 59 y.o.   MRN: [id number] [facility] Progress Note   Assessment/ Plan:   1. 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  Empirically on corticosteroids. Hepatitis nonreactive.  Had first HD on 7/20 after tunneled catheter with IR.   -rediscussed with [facility] nephropathology 7/22: ~50% IF/TA, many RBC casts (atypical of DM nephropathy). With this data along with updates on ANCA/PR3 serologies will give him a chance and start immunosuppression. Final path reviewed which is more in line of ANCA associated etiology/pauci immune focal necrotizing GN with less than 10% segmental cellular crescent formation) - no changes with dialysis today, tolerating treatment, will maintain on tts schdule - for now on pred 60 mg daily; plan for 6 month taper. Continue with 60mg  daily for 1 month then will plan on taper to 50mg  daily thereafter -cytoxan 100mg  daily ( 0.8mg /kg/day), started 06/03/2020. will tentatively plan for a 3 month duration of treatment and reassess for any form of renal recovery (can extend treatment up to 6 months if needed). Can utilize ritux for flares if needed moving forward (hep panel and quant panel done). Having issues with prior auth with insurance, unable to obtain this today and patient will be discharged today -sulfa allergy but cannot afford mepron. Gave test dose of bactrim 7/24 and toelrated dose (sulfa allergy is questionable). Will use ss bactrim TTS for pcp ppx. - ordered renal DM diet - Discussed with Colleen re: CLIP as AKI --> Has a chair for this coming Tuesday  at [facility], seat time: 11:30am. See below for discharge medications -okay from nephrology perspective for discharge, discussed with patient about follow up plan and discharge medications. He is to call the office to set up an appointment otherwise will see [place] at the dialysis unit  2.  GI bleed/melena: Suspected to be secondary to duodenitis based on EGD/colonoscopy and currently on PPI.  He is hemodynamically stable and without indications for PRBC transfusion.  3.  Hypertension: increased amlodipine to 10 mg daily.  He missed multiple meds on 7/20 including the amlodipine and hydralazine doses.  Follow on current regimen for now.  Lisinopril and furosemide currently on hold.  4.  Peripheral vascular disease status post right BKA with intact dressing.  5. Metabolic acidosis - stopped bicarbonate; now on on HD  6. Metabolic bone disease/Hyperphosphatemia - on HD and started renvela. Pth=74 7/22  7.  Type 2 diabetes mellitus: Insulin-dependent and likely the predominating etiology of his underlying chronic kidney disease.  Medications from nephrology perspective to be discharged on: Cytoxan 100mg  daily--will need to get prior auth for this Bactrim SS tues, thurs, sat Protonix Prednisone 60mg  daily with over all 6 month taper Renvela 800mg  TIDAC Folic Acid 1mg  daily  Discussed with primary service.   Subjective:   Tolerated dialysis yesterday. Received test dose of bactrim which he tolerated (no sob, hoarse voice, rash, swelling). Is insistent that he is going home today. Needs prior auth for cytoxan (has not been able to get this med from [place])    Objective:   BP (!) 156/69 (BP Location: Left Arm)   Pulse 74   Temp 97.8 F (36.6  C) (Oral)   Resp 18   Ht 6' (1.829 m)   Wt (!) 120.8 kg   SpO2 98%   BMI 36.12 kg/m   Intake/Output Summary (Last 24 hours) at 06/05/2020 1455 Last data filed at 06/05/2020 0900 Gross per 24 hour  Intake 600 ml  Output 500 ml  Net 100 ml    Weight change:   Physical Exam:   Gen: adult male in bed in NAD   CVS: S1 and S2 no rub Resp: Clear to auscultation, no rales/rhonchi; unlabored at rest  Abd: Soft, obese, nontender Ext: trace edema. Right leg status post BKA pysch normal mood and affect Neuro  Alert and oriented x 3 provides hx and follows commands Access: RIJ tunneled catheter: c/d/i  Imaging: No results found.  Labs: BMET Recent Labs  Lab 05/30/20 0827 05/31/20 0751 06/01/20 0453 06/02/20 0404 06/03/20 0712 06/04/20 0241  NA 137 137 138 136 136 134*  K 4.0 4.0 3.8 4.4 4.1 4.6  CL 102 102 104 102 101 102  CO2 19* 18* 19* 24 27 20*  GLUCOSE 129* 125* 260* 235* 245* 377*  BUN 158* 172* 135* 103* 76* 94*  CREATININE 8.64* 9.31* 7.47* 6.15* 5.15* 5.85*  CALCIUM 7.9* 7.7* 7.7* 7.7* 7.9* 7.8*  PHOS 10.5* 10.0* 7.8* 6.0* 4.9* 4.9*   CBC Recent Labs  Lab 05/31/20 0751 06/01/20 0637 06/02/20 0404 06/04/20 0730  WBC 6.7 7.1 7.2 10.4  NEUTROABS  --   --  5.5  --   HGB 10.1* 9.9* 9.3* 9.8*  HCT 30.5* 29.7* 28.9* 30.2*  MCV 87.1 86.1 86.8 88.6  PLT 149* 144* 112* 114*    Medications:    . amLODipine  10 mg Oral Daily  . apixaban  5 mg Oral BID  . atorvastatin  20 mg Oral Daily  . Chlorhexidine Gluconate Cloth  6 each Topical Q0600  . cyclophosphamide  100 mg Oral Daily  . folic acid  1 mg Oral Daily  . hydrALAZINE  100 mg Oral TID  . insulin aspart  0-15 Units Subcutaneous TID WC  . insulin aspart  0-5 Units Subcutaneous QHS  . insulin glargine  20 Units Subcutaneous BID  . isosorbide mononitrate  30 mg Oral Daily  . metoprolol tartrate  150 mg Oral BID  . multivitamin with minerals  1 tablet Oral Daily  . pantoprazole  40 mg Oral BID  . prazosin  2 mg Oral QPC supper  . predniSONE  60 mg Oral Q breakfast  . sevelamer carbonate  800 mg Oral TID WC  . sulfamethoxazole-trimethoprim  1 tablet Oral Once per day on Tue Thu Sat  . thiamine  100 mg Oral Daily   Or  . thiamine  100 mg  Intravenous Daily   Gean Quint, MD 06/05/2020, 2:55 PM

## 2020-06-05 NOTE — Progress Notes (Signed)
D/C summary given to pt.  Pt. Verbalized understanding to all d/c instructions.  Tele d/c CCMD notified, IV site d/c 06/04/20 without any issues.  Pt reports all medications has been picked up from [facility].

## 2020-06-06 ENCOUNTER — Encounter (HOSPITAL_COMMUNITY): Payer: Self-pay | Admitting: Nephrology

## 2020-06-06 ENCOUNTER — Telehealth: Payer: Self-pay | Admitting: Nephrology

## 2020-06-06 LAB — QUANTIFERON-TB GOLD PLUS: QuantiFERON-TB Gold Plus: UNDETERMINED — AB

## 2020-06-06 LAB — QUANTIFERON-TB GOLD PLUS (RQFGPL)
QuantiFERON Mitogen Value: 0.1 IU/mL
QuantiFERON Nil Value: 0 IU/mL
QuantiFERON TB1 Ag Value: 0 IU/mL
QuantiFERON TB2 Ag Value: 0 IU/mL

## 2020-06-06 MED FILL — SULFAMETHOXAZOLE-TMP SS TAB: 400-80 | 28 days supply | Qty: 12 | Fill #0

## 2020-06-06 NOTE — Telephone Encounter (Signed)
Transition of Care Contact from [facility]   Date of Discharge: 06/05/20 Date of Contact: 06/06/20 Method of contact: phone Talked to patient and wife   Patient contacted to discuss transition of care form recent hospitaliztion. Patient was admitted to [facility] from 05/23/20 to 06/05/20 with the discharge diagnosis of AKI on CKD stage IV s/s necrotizing GN requiring dialysis.  Medication changes were reviewed.   Patient will follow up with is outpatient dialysis center 06/06/20.   Other follow up needs include prior authorization for cytoxan.    Jen Mow, PA-C [facility] Pager: [phone]

## 2020-06-07 DIAGNOSIS — N179 Acute kidney failure, unspecified: Secondary | ICD-10-CM | POA: Diagnosis not present

## 2020-06-07 MED FILL — CYCLOPHOSPHAMIDE 50 MG CAPS: 50 | 30 days supply | Qty: 60 | Fill #0

## 2020-06-09 DIAGNOSIS — N179 Acute kidney failure, unspecified: Secondary | ICD-10-CM | POA: Diagnosis not present

## 2020-06-10 ENCOUNTER — Other Ambulatory Visit: Payer: Self-pay | Admitting: *Deleted

## 2020-06-13 ENCOUNTER — Other Ambulatory Visit: Payer: Self-pay | Admitting: *Deleted

## 2020-06-13 NOTE — Patient Outreach (Signed)
[facility] Care Management  06/13/2020  Alexander [name] [birth year] [id number]  Telephone outreach for care management services.   Pt identity verified. Advised pt reason for call to enroll in care management services. He states he is well covered with his doctors and does not feel he has needs for additional support.  Will send pt a successful outreach letter. Encouraged him to call me back if he decides this is a service that would benefit him.  [name]. [name], MSN, [facility] Gerontological Nurse Practitioner [facility] Care Management [phone]

## 2020-06-16 ENCOUNTER — Telehealth: Payer: Self-pay | Admitting: Cardiology

## 2020-06-17 NOTE — Telephone Encounter (Signed)
His vml is not set up yet. I gave him 08/10 at 10:15am with dr Terri Skains. I sent him a mychart message to confirm.   Thank you  -Leda Quail

## 2020-06-21 ENCOUNTER — Ambulatory Visit: Payer: Medicare Other | Admitting: Cardiology

## 2020-06-23 DIAGNOSIS — N179 Acute kidney failure, unspecified: Secondary | ICD-10-CM | POA: Diagnosis not present

## 2020-06-24 ENCOUNTER — Other Ambulatory Visit: Payer: Self-pay | Admitting: Cardiology

## 2020-06-27 ENCOUNTER — Other Ambulatory Visit: Payer: Self-pay | Admitting: Cardiology

## 2020-06-29 ENCOUNTER — Ambulatory Visit (INDEPENDENT_AMBULATORY_CARE_PROVIDER_SITE_OTHER): Payer: Medicare Other | Admitting: Family

## 2020-06-29 ENCOUNTER — Encounter: Payer: Self-pay | Admitting: Family

## 2020-06-29 VITALS — Ht 72.0 in | Wt 266.0 lb

## 2020-06-29 DIAGNOSIS — Z89432 Acquired absence of left foot: Secondary | ICD-10-CM

## 2020-06-29 DIAGNOSIS — Z89511 Acquired absence of right leg below knee: Secondary | ICD-10-CM | POA: Diagnosis not present

## 2020-06-29 NOTE — Progress Notes (Addendum)
Post-Op Visit Note   Patient: Alexander [name]           Date of Birth: 1961/10/09           MRN: [id number] Visit Date: 06/29/2020 PCP: [name], MD  Chief Complaint:  Chief Complaint  Patient presents with  . Right Leg - Routine Post Op    03/11/20 right BKA     HPI:  HPI The patient is a 59 year old gentleman who presents today status post right below-knee amputation on April 30.  He is currently working with Museum/gallery curator for his prosthesis set up. Patient is eager to ambulate with a prosthesis. Has been wearing two shrinkers, healing slowly. Several scabbed areas. No open ulcers or concerns. Eager to get set up with prosthesis and shoe wear for left TMA.   The patient has the ability to navigate uneven terrain. He has ability to become a Hydrographic surveyor with the ability to traverse most environmental barriers and may have vocational, therapeutic, or exercise activity that would require prosthesis utilization beyond simple locomotion. He will make an excellent K3 level ambulator.  He is expecting to ambulate outside the home a significant amount of time.   Ortho Exam On examination of the right residual limb there are scattered eschars. Medially has 2 areas that are just 5 mm in diameter, debrided with 10 blade back to viable tissue.  Some bleeding.  Centrally incision has fully healed does have another 5 mm in diameter ulcer laterally.  This was debrided of eschar as well there is no surrounding erythema no odor no purulence wound consolidating well.    Visit Diagnoses:  No diagnosis found.  Plan: We will continue with daily Dial soap cleansing.  Dry dressing changes.  Shrinker with direct skin contact.  He will follow-up in 3 months after set up with prosthesis.  Provided an order to [facility] for his prosthesis set up of the right below-knee amputation as well as custom orthotic extra-depth shoe with a spacer for his left foot.    Follow-Up Instructions: No follow-ups on  file.   Imaging: No results found.  Orders:  No orders of the defined types were placed in this encounter.  No orders of the defined types were placed in this encounter.    PMFS History: Patient Active Problem List   Diagnosis Date Noted  . Hemodialysis patient ([place]) 06/04/2020  . Necrotizing glomerulonephritis 06/04/2020  . Occult blood in stools   . Melena   . Upper GI bleeding 05/23/2020  . Acute GI bleeding 05/23/2020  . Acute hematogenous osteomyelitis of right foot ([place]) 03/11/2020  . Gangrene of right foot ([place])   . Diabetic foot ulcer associated with type 2 diabetes mellitus ([place]) 02/19/2020  . Heel ulcer ([place]) 11/05/2019  . Atherosclerotic PVD with ulceration ([place]) 09/09/2017  . Essential hypertension, benign 02/07/2015  . Renal insufficiency 02/07/2015  . Post PTCA 11/09/2014  . S/P PTCA (percutaneous transluminal coronary angioplasty) 11/09/2014  . Pacemaker: [place] DR MRI [id number] Dual chamber pacemaker 10/08/2014 10/08/2014  . Encounter for care of pacemaker 10/08/2014  . Sinus node dysfunction ([place]) 10/08/2014  . Cardiac asystole ([place]) 10/07/2014  . Cardiac syncope 10/07/2014  . Diabetes mellitus with stage 3 chronic kidney disease ([place]) 10/07/2014  . CAD (coronary artery disease), native coronary artery 10/07/2014  . Diabetes mellitus due to underlying condition without complications ([place]) 35/70/1779  . Anemia, unspecified 05/25/2014  . Syncope 04/30/2014  . Atherosclerosis of native arteries of the extremities with  gangrene ([place]) 12/16/2013  . Volume overload 10/30/2013  . Acute renal failure ([place]) 10/28/2013  . PAD (peripheral artery disease) ([place]) 10/27/2013  . Leukocytosis 10/21/2013  . Anemia 10/21/2013  . Osteomyelitis of left foot ([place]) 08/19/2013  . Spinal stenosis, lumbar region, with neurogenic claudication 06/12/2012    Class: Diagnosis of  . DIABETES MELLITUS, TYPE II 03/15/2009  . ALCOHOL ABUSE 03/15/2009  . TOBACCO USE 03/15/2009   . HYPERTENSION 03/15/2009   Past Medical History:  Diagnosis Date  . Alcohol abuse   . Anemia   . Arthritis    "patient does not think so."  . Atrial fibrillation ([place])   . Cardiac syncope 10/07/14   rapid A fib with 8 sec pauses on converison with syncope- temp pacing wire placed then PPM  . Cataract    BILATERAL  . Chronic kidney disease   . Coronary artery disease   . Diabetes mellitus    dx---been  awhile  . Encounter for care of pacemaker 10/08/2014  . History of blood transfusion   . Hyperlipemia   . Hypertension   . Osteomyelitis ([place])    right foot  . Peripheral vascular disease ([place])   . Presence of permanent cardiac pacemaker 10/08/2014  . Sinus node dysfunction ([place]) 10/08/2014    Family History  Problem Relation Age of Onset  . Diabetes type II Mother   . Hypertension Mother   . Diabetes Mother   . Liver cancer Father   . Diabetes type II Sister   . Breast cancer Sister   . Diabetes Sister   . Hypertension Sister   . Diabetes type II Brother   . Kidney failure Brother   . Diabetes Brother   . Hypertension Brother   . Diabetes type II Sister     Past Surgical History:  Procedure Laterality Date  . ABDOMINAL AORTOGRAM W/LOWER EXTREMITY N/A 09/09/2017   Procedure: ABDOMINAL AORTOGRAM W/LOWER EXTREMITY;  Surgeon: Angelia Mould, MD;  Location: [facility];  Service: Cardiovascular;  Laterality: N/A;  . ABDOMINAL AORTOGRAM W/LOWER EXTREMITY Right 11/11/2019   Procedure: ABDOMINAL AORTOGRAM W/LOWER EXTREMITY;  Surgeon: Marty Heck, MD;  Location: [facility];  Service: Cardiovascular;  Laterality: Right;  . AMPUTATION Left 08/19/2013   Procedure: AMPUTATION RAY;  Surgeon: Alta Corning, MD;  Location: [facility];  Service: Orthopedics;  Laterality: Left;  ray amputation left 5th  . AMPUTATION Left 10/27/2013   Procedure: AMPUTATION DIGIT-LEFT 4TH TOE, 4th and 5th metatarsal.;  Surgeon: Angelia Mould, MD;  Location: [place];   Service: Vascular;  Laterality: Left;  . AMPUTATION Left 11/04/2013   Procedure: LEFT FOOT TRANS-METATARSAL AMPUTATION WITH WOUND CLOSURE ;  Surgeon: Alta Corning, MD;  Location: [facility];  Service: Orthopedics;  Laterality: Left;  . AMPUTATION Right 09/10/2017   Procedure: RIGHT FOURTH AND FIFTH TOE AMPUTATION;  Surgeon: Angelia Mould, MD;  Location: [place] [facility];  Service: Vascular;  Laterality: Right;  . AMPUTATION Right 02/19/2020   Procedure: AMPUTATION RIGHT 3RD TOE;  Surgeon: Angelia Mould, MD;  Location: [place] [place];  Service: Vascular;  Laterality: Right;  . AMPUTATION Right 03/11/2020   Procedure: RIGHT BELOW KNEE AMPUTATION;  Surgeon: Newt Minion, MD;  Location: [facility];  Service: Orthopedics;  Laterality: Right;  . APPLICATION OF WOUND VAC Right 02/19/2020   Procedure: Application Of Wound Vac Right foot;  Surgeon: Angelia Mould, MD;  Location: [place];  Service: Vascular;  Laterality: Right;  . CARDIAC CATHETERIZATION  10/07/2014   Procedure: LEFT HEART CATH AND CORONARY ANGIOGRAPHY;  Surgeon: Laverda Page, MD;  Location: [facility];  Service: Cardiovascular;;  . COLONOSCOPY W/ POLYPECTOMY    . COLONOSCOPY WITH PROPOFOL N/A 05/25/2020   Procedure: COLONOSCOPY WITH PROPOFOL;  Surgeon: Gatha Mayer, MD;  Location: [facility];  Service: Endoscopy;  Laterality: N/A;  . EMBOLECTOMY Right 09/12/2017   Procedure: Thrombectomy  & Redo Right Below Knee Popliteal Artery Bypass Graft  ;  Surgeon: Waynetta Sandy, MD;  Location: [place];  Service: Vascular;  Laterality: Right;  . ESOPHAGOGASTRODUODENOSCOPY (EGD) WITH PROPOFOL N/A 05/24/2020   Procedure: ESOPHAGOGASTRODUODENOSCOPY (EGD) WITH PROPOFOL;  Surgeon: Ladene Artist, MD;  Location: [facility];  Service: Endoscopy;  Laterality: N/A;  . EYE SURGERY Bilateral    cataract  . FEMORAL-TIBIAL BYPASS GRAFT Left 10/27/2013   Procedure: BYPASS GRAFT LEFT FEMORAL- POSTERIOR TIBIAL ARTERY;  Surgeon: Angelia Mould, MD;  Location: [place];  Service: Vascular;  Laterality: Left;  . FEMORAL-TIBIAL BYPASS GRAFT Right 09/10/2017   Procedure: RIGHT SUPERFICIAL  FEMORAL ARTERY-BELOW KNEE POPLITEAL ARTERY BYPASS GRAFT WITH VEIN;  Surgeon: Angelia Mould, MD;  Location: [place];  Service: Vascular;  Laterality: Right;  . I & D EXTREMITY Left 10/27/2013   Procedure: IRRIGATION AND DEBRIDEMENT EXTREMITY- LEFT FOOT;  Surgeon: Angelia Mould, MD;  Location: [place];  Service: Vascular;  Laterality: Left;  . I & D EXTREMITY Right 01/26/2020   Procedure: IRRIGATION AND DEBRIDEMENT EXTREMITY RIGHT FOOT WOUND;  Surgeon: Angelia Mould, MD;  Location: [place];  Service: Vascular;  Laterality: Right;  . INTRAOPERATIVE ARTERIOGRAM Right 09/10/2017   Procedure: INTRA OPERATIVE ARTERIOGRAM;  Surgeon: Angelia Mould, MD;  Location: [place];  Service: Vascular;  Laterality: Right;  . IR ANGIOGRAM FOLLOW UP STUDY  09/09/2017  . IR FLUORO GUIDE CV LINE RIGHT  05/31/2020  . IR US GUIDE VASC ACCESS RIGHT  06/01/2020  . LEFT HEART CATHETERIZATION WITH CORONARY ANGIOGRAM N/A 11/09/2014   Procedure: LEFT HEART CATHETERIZATION WITH CORONARY ANGIOGRAM;  Surgeon: Laverda Page, MD;  Location: [facility] C[facility] LAB;  Service: Cardiovascular;  Laterality: N/A;  . LOWER EXTREMITY ANGIOGRAM Right 11/08/2015   Procedure: Lower Extremity Angiogram;  Surgeon: Serafina Mitchell, MD;  Location: [facility] [facility];  Service: Cardiovascular;  Laterality: Right;  . LUMBAR LAMINECTOMY  06/13/2012   Procedure: MICRODISCECTOMY LUMBAR LAMINECTOMY;  Surgeon: Jessy Oto, MD;  Location: [place];  Service: Orthopedics;  Laterality: N/A;  Central laminectomy L2-3, L3-4, L4-5  . PACEMAKER INSERTION  10/08/14   MDT Advisa MRI compatible dual chamber pacemaker implanted by Dr Caryl Comes for syncope with post-termination pauses  . PERCUTANEOUS CORONARY STENT INTERVENTION (PCI-S)  11/09/2014   des to lad & distal circumflex         Dr  [name]  .  PERIPHERAL VASCULAR BALLOON ANGIOPLASTY Right 11/11/2019   Procedure: PERIPHERAL VASCULAR BALLOON ANGIOPLASTY;  Surgeon: Marty Heck, MD;  Location: [place] [facility];  Service: Cardiovascular;  Laterality: Right;  below knee popliteal, tibioperoneal trunk, posterior tibial arteries  . PERIPHERAL VASCULAR CATHETERIZATION N/A 11/08/2015   Procedure: Abdominal Aortogram;  Surgeon: Serafina Mitchell, MD;  Location: [place] [facility];  Service: Cardiovascular;  Laterality: N/A;  . PERIPHERAL VASCULAR CATHETERIZATION Right 11/08/2015   Procedure: Peripheral Vascular Atherectomy;  Surgeon: Serafina Mitchell, MD;  Location: [place] [facility];  Service: Cardiovascular;  Laterality: Right;  . PERIPHERAL VASCULAR CATHETERIZATION N/A 10/10/2016   Procedure:  Lower Extremity Angiography;  Surgeon: Waynetta Sandy, MD;  Location: [place] [facility];  Service: Cardiovascular;  Laterality: N/A;  . PERIPHERAL VASCULAR CATHETERIZATION Right 10/10/2016   Procedure: Peripheral Vascular Atherectomy;  Surgeon: Waynetta Sandy, MD;  Location: [place] [facility];  Service: Cardiovascular;  Laterality: Right;  Popliteal  . PERMANENT PACEMAKER INSERTION N/A 10/08/2014   Procedure: PERMANENT PACEMAKER INSERTION;  Surgeon: Deboraha Sprang, MD;  Location: [facility] CATH LAB;  Service: Cardiovascular;  Laterality: N/A;  . TEMPORARY PACEMAKER INSERTION Bilateral 10/07/2014   Procedure: TEMPORARY PACEMAKER INSERTION;  Surgeon: Laverda Page, MD;  Location: [facility] CATH LAB;  Service: Cardiovascular;  Laterality: Bilateral;  . WOUND DEBRIDEMENT Right 02/19/2020   Procedure: DEBRIDEMENT WOUND RIGHT FOOT;  Surgeon: Angelia Mould, MD;  Location[facility];  Service: Vascular;  Laterality: Right;   Social History   Occupational History  . Occupation: Disabled  Tobacco Use  . Smoking status: Former Smoker    Packs/day: 1.50    Years: 30.00    Pack years: 45.00    Types: Cigars    Quit date: 10/07/2014     Years since quitting: 5.7  . Smokeless tobacco: Never Used  Vaping Use  . Vaping Use: Never used  Substance and Sexual Activity  . Alcohol use: Yes    Alcohol/week: 6.0 standard drinks    Types: 6 Cans of beer per week    Comment: 2-3 beers daily  . Drug use: No  . Sexual activity: Not on file

## 2020-06-30 DIAGNOSIS — N179 Acute kidney failure, unspecified: Secondary | ICD-10-CM | POA: Diagnosis not present

## 2020-07-02 ENCOUNTER — Other Ambulatory Visit: Payer: Self-pay | Admitting: Cardiology

## 2020-07-05 DIAGNOSIS — N179 Acute kidney failure, unspecified: Secondary | ICD-10-CM | POA: Diagnosis not present

## 2020-07-07 ENCOUNTER — Other Ambulatory Visit: Payer: Self-pay | Admitting: Cardiology

## 2020-07-07 DIAGNOSIS — I480 Paroxysmal atrial fibrillation: Secondary | ICD-10-CM | POA: Diagnosis not present

## 2020-07-07 MED ORDER — APIXABAN 5 MG PO TABS: 5.0000 mg | ORAL_TABLET | Freq: Two times a day (BID) | ORAL | 0 refills | Status: DC

## 2020-07-07 NOTE — Telephone Encounter (Signed)
Patient has been difficult to be contacted, I have not seen him in quite a while, he has had multiple hospital admissions and recently got out of the hospital again and I was able to connect with his daughter [name] and explained to her that he is on Eliquis which is probably appropriate to be on but I do not have his full list.  I did research and looked at his charts from the hospitalizations and it appears that he is still on Eliquis 5 mg twice daily.  I have refilled for 1 month supply, advised them that I will not refill any of the medications again unless he makes an appointment to see [place].  Patient's daughter reassured me that she will call us and make an appointment tomorrow to be seen in the next couple weeks.  This was a 11 minutes of telephone encounter and additional 9 minutes for evaluation of his medical records.    ICD-10-CM   1. Paroxysmal atrial fibrillation (HCC)  I48.0 apixaban (ELIQUIS) 5 MG TABS tablet    Adrian Prows, MD, [facility] 07/07/2020, 6:27 PM Office: [phone]

## 2020-07-07 NOTE — Telephone Encounter (Signed)
3rd refill request from pharmacy. Patient has not followed up since you reached out on 8/5 & 8/6. Patient did not show to appt on 8/10 w [facility]. Is refill appropriate?

## 2020-07-07 NOTE — Addendum Note (Signed)
Addended by: Kela Millin on: 07/07/2020 06:29 PM   Modules accepted: Orders

## 2020-07-13 ENCOUNTER — Ambulatory Visit: Payer: Medicare Other | Admitting: Cardiology

## 2020-07-13 ENCOUNTER — Other Ambulatory Visit: Payer: Self-pay

## 2020-07-13 ENCOUNTER — Encounter: Payer: Self-pay | Admitting: Cardiology

## 2020-07-13 VITALS — BP 133/59 | HR 74 | Resp 16 | Ht 72.0 in | Wt 270.0 lb

## 2020-07-13 DIAGNOSIS — I4729 Other ventricular tachycardia: Secondary | ICD-10-CM

## 2020-07-13 DIAGNOSIS — Z7901 Long term (current) use of anticoagulants: Secondary | ICD-10-CM

## 2020-07-13 DIAGNOSIS — I739 Peripheral vascular disease, unspecified: Secondary | ICD-10-CM

## 2020-07-13 DIAGNOSIS — I2510 Atherosclerotic heart disease of native coronary artery without angina pectoris: Secondary | ICD-10-CM

## 2020-07-13 DIAGNOSIS — Z950 Presence of cardiac pacemaker: Secondary | ICD-10-CM | POA: Diagnosis not present

## 2020-07-13 DIAGNOSIS — I480 Paroxysmal atrial fibrillation: Secondary | ICD-10-CM

## 2020-07-13 DIAGNOSIS — Z7289 Other problems related to lifestyle: Secondary | ICD-10-CM

## 2020-07-13 DIAGNOSIS — I472 Ventricular tachycardia: Secondary | ICD-10-CM | POA: Diagnosis not present

## 2020-07-13 DIAGNOSIS — I4891 Unspecified atrial fibrillation: Secondary | ICD-10-CM

## 2020-07-13 DIAGNOSIS — I70269 Atherosclerosis of native arteries of extremities with gangrene, unspecified extremity: Secondary | ICD-10-CM

## 2020-07-13 DIAGNOSIS — Z87891 Personal history of nicotine dependence: Secondary | ICD-10-CM

## 2020-07-13 DIAGNOSIS — I10 Essential (primary) hypertension: Secondary | ICD-10-CM

## 2020-07-13 DIAGNOSIS — Z89511 Acquired absence of right leg below knee: Secondary | ICD-10-CM

## 2020-07-13 DIAGNOSIS — Z789 Other specified health status: Secondary | ICD-10-CM

## 2020-07-13 MED ORDER — APIXABAN 5 MG PO TABS: 5.0000 mg | ORAL_TABLET | Freq: Two times a day (BID) | ORAL | 0 refills | Status: DC

## 2020-07-13 MED ORDER — ISOSORBIDE MONONITRATE ER 30 MG PO TB24: 30.0000 mg | ORAL_TABLET | Freq: Every day | ORAL | 0 refills | Status: DC

## 2020-07-13 NOTE — Progress Notes (Signed)
Alexander [name] Date of Birth: 1960-12-04 MRN: [id number] Primary Care Provider:Tisovec, [name], MD Former Cardiology Providers: Dr. Adrian Prows, Jeri Lager, APRN, FNP-C Primary Cardiologist: Rex Kras, DO, [facility] (established care 04/22/2020) Electrophysiologist: Dr. Cristopher Peru  Date: 07/13/20 Last Office Visit: 04/22/2020  Chief Complaint  Patient presents with  . Review pacemaker findings  . Follow-up    Hospital follow-up  . Medication Refill    HPI  Alexander [name] is a 59 y.o.  male who presents to the office with a chief complaint of " medication refills, recent hospitalization follow-up, and review pacemaker findings." Patient has a complex past medical history and multiple cardiovascular risk factors which include but not limited to: Paroxysmal atrial fibrillation, on oral anticoagulation, coronary artery disease ( angioplasty and stenting to the LAD and dominant circumflex in December 2015, diffusely diseased small RCA), peripheral artery disease with prior bypass, status post right BKA, pacemaker implantation secondary to complete heart block (implanted 10/07/2014), former smoker, insulin-dependent diabetes mellitus, alcohol use, hemodialysis, obesity due to excess calories.  Patient presents to the office accompanied by [name].  Formally under the care of Jeri Lager, APRN, FNP-C and  reestablish care with myself back in June 2021.   At the last office visit patient was noted to have nonsustained ventricular tachycardia noted on his pacemaker. He was recommended to undergo stress test and echocardiogram for further evaluation as he has multiple cardiovascular risk factors.  Patient had canceled all his testing as he was working with his vascular and orthopedic surgeon given his recent right BKA. Since then patient states that he has been hospitalized and had a GI bleed evaluation with no known source per patient. His serum creatinine level is elevated to the  point that he is on hemodialysis Tuesdays Thursdays and Saturdays. He continues to make minimal urine.   Patient states that he has been off of Eliquis for the last 2 weeks at least because he ran out of his medications. And is here for medication refill and to reconsider stress test given his pacemaker findings noted above.   Reviewed the most recent pacemaker report findings with the patient and his daughter at today's visit.  ALLERGIES: Allergies  Allergen Reactions  . Keflex [Cephalexin] Nausea And Vomiting  . Pletal [Cilostazol] Palpitations    "Can hear heart beating loudly".  . Sulfa Antibiotics Rash   MEDICATION LIST PRIOR TO VISIT: Current Outpatient Medications on File Prior to Visit  Medication Sig Dispense Refill  . ACCU-CHEK FASTCLIX LANCETS MISC Check blood sugar TID & QHS 102 each 2  . amLODipine (NORVASC) 5 MG tablet Take 5 mg by mouth daily.    Marland Kitchen atorvastatin (LIPITOR) 20 MG tablet Take 1 tablet by mouth once daily (Patient taking differently: Take 20 mg by mouth daily. ) 90 tablet 2  . blood glucose meter kit and supplies KIT Check blood sugar TID & QHS 1 each 0  . Blood Glucose Monitoring Suppl (ACCU-CHEK ADVANTAGE DIABETES) kit Use as instructed 1 each 0  . cyclophosphamide (CYTOXAN) 50 MG capsule Take 50 mg by mouth daily. Take with food to minimize GI upset. Take early in the day and maintain hydration.    Marland Kitchen glucose blood (ACCU-CHEK AVIVA PLUS) test strip Use as instructed 100 each 12  . glucose blood (CHOICE DM FORA G20 TEST STRIPS) test strip Check blood sugar TID & QHS 100 each 12  . glucose blood test strip Use as instructed 100 each 12  . hydrALAZINE (  APRESOLINE) 100 MG tablet TAKE 1 TABLET BY MOUTH THREE TIMES DAILY (Patient taking differently: Take 100 mg by mouth 3 (three) times daily. ) 270 tablet 0  . insulin NPH-regular Human (70-30) 100 UNIT/ML injection Inject 46-56 Units into the skin daily with breakfast.    . metoprolol tartrate (LOPRESSOR) 100 MG  tablet Take 100 mg by mouth 2 (two) times daily.    . prazosin (MINIPRESS) 2 MG capsule Take 1 capsule (2 mg total) by mouth daily after supper. 90 capsule 1  . pantoprazole (PROTONIX) 40 MG tablet Take 1 tablet (40 mg total) by mouth 2 (two) times daily for 20 days. (Patient not taking: Reported on 07/13/2020) 40 tablet 0   No current facility-administered medications on file prior to visit.    PAST MEDICAL HISTORY: Past Medical History:  Diagnosis Date  . Alcohol abuse   . Anemia   . Arthritis    "patient does not think so."  . Atrial fibrillation ([place])   . Cardiac syncope 10/07/14   rapid A fib with 8 sec pauses on converison with syncope- temp pacing wire placed then PPM  . Cataract    BILATERAL  . Chronic kidney disease   . Coronary artery disease   . Diabetes mellitus    dx---been  awhile  . Encounter for care of pacemaker 10/08/2014  . History of blood transfusion   . Hyperlipemia   . Hypertension   . Osteomyelitis ([place])    right foot  . Peripheral vascular disease ([place])   . Presence of permanent cardiac pacemaker 10/08/2014  . Sinus node dysfunction ([place]) 10/08/2014    PAST SURGICAL HISTORY: Past Surgical History:  Procedure Laterality Date  . ABDOMINAL AORTOGRAM W/LOWER EXTREMITY N/A 09/09/2017   Procedure: ABDOMINAL AORTOGRAM W/LOWER EXTREMITY;  Surgeon: Angelia Mould, MD;  Location: [place] [facility];  Service: Cardiovascular;  Laterality: N/A;  . ABDOMINAL AORTOGRAM W/LOWER EXTREMITY Right 11/11/2019   Procedure: ABDOMINAL AORTOGRAM W/LOWER EXTREMITY;  Surgeon: Marty Heck, MD;  Location: [place] [facility];  Service: Cardiovascular;  Laterality: Right;  . AMPUTATION Left 08/19/2013   Procedure: AMPUTATION RAY;  Surgeon: Alta Corning, MD;  Location: [place];  Service: Orthopedics;  Laterality: Left;  ray amputation left 5th  . AMPUTATION Left 10/27/2013   Procedure: AMPUTATION DIGIT-LEFT 4TH TOE, 4th and 5th metatarsal.;  Surgeon: Angelia Mould, MD;  Location: [place];  Service: Vascular;  Laterality: Left;  . AMPUTATION Left 11/04/2013   Procedure: LEFT FOOT TRANS-METATARSAL AMPUTATION WITH WOUND CLOSURE ;  Surgeon: Alta Corning, MD;  Location: [place];  Service: Orthopedics;  Laterality: Left;  . AMPUTATION Right 09/10/2017   Procedure: RIGHT FOURTH AND FIFTH TOE AMPUTATION;  Surgeon: Angelia Mould, MD;  Location: [place];  Service: Vascular;  Laterality: Right;  . AMPUTATION Right 02/19/2020   Procedure: AMPUTATION RIGHT 3RD TOE;  Surgeon: Angelia Mould, MD;  Location: [place];  Service: Vascular;  Laterality: Right;  . AMPUTATION Right 03/11/2020   Procedure: RIGHT BELOW KNEE AMPUTATION;  Surgeon: Newt Minion, MD;  Location: [place];  Service: Orthopedics;  Laterality: Right;  . APPLICATION OF WOUND VAC Right 02/19/2020   Procedure: Application Of Wound Vac Right foot;  Surgeon: Angelia Mould, MD;  Location: [place];  Service: Vascular;  Laterality: Right;  . CARDIAC CATHETERIZATION  10/07/2014   Procedure: LEFT HEART CATH AND CORONARY ANGIOGRAPHY;  Surgeon: Laverda Page, MD;  Location: [facility] C[facility] LAB;  Service: Cardiovascular;;  .  COLONOSCOPY W/ POLYPECTOMY    . COLONOSCOPY WITH PROPOFOL N/A 05/25/2020   Procedure: COLONOSCOPY WITH PROPOFOL;  Surgeon: Gatha Mayer, MD;  Location: [facility];  Service: Endoscopy;  Laterality: N/A;  . EMBOLECTOMY Right 09/12/2017   Procedure: Thrombectomy  & Redo Right Below Knee Popliteal Artery Bypass Graft  ;  Surgeon: Waynetta Sandy, MD;  Location: [place];  Service: Vascular;  Laterality: Right;  . ESOPHAGOGASTRODUODENOSCOPY (EGD) WITH PROPOFOL N/A 05/24/2020   Procedure: ESOPHAGOGASTRODUODENOSCOPY (EGD) WITH PROPOFOL;  Surgeon: Ladene Artist, MD;  Location: [facility];  Service: Endoscopy;  Laterality: N/A;  . EYE SURGERY Bilateral    cataract  . FEMORAL-TIBIAL BYPASS GRAFT Left 10/27/2013   Procedure: BYPASS GRAFT LEFT FEMORAL- POSTERIOR TIBIAL  ARTERY;  Surgeon: Angelia Mould, MD;  Location: [place];  Service: Vascular;  Laterality: Left;  . FEMORAL-TIBIAL BYPASS GRAFT Right 09/10/2017   Procedure: RIGHT SUPERFICIAL  FEMORAL ARTERY-BELOW KNEE POPLITEAL ARTERY BYPASS GRAFT WITH VEIN;  Surgeon: Angelia Mould, MD;  Location: [place];  Service: Vascular;  Laterality: Right;  . I & D EXTREMITY Left 10/27/2013   Procedure: IRRIGATION AND DEBRIDEMENT EXTREMITY- LEFT FOOT;  Surgeon: Angelia Mould, MD;  Location: [place];  Service: Vascular;  Laterality: Left;  . I & D EXTREMITY Right 01/26/2020   Procedure: IRRIGATION AND DEBRIDEMENT EXTREMITY RIGHT FOOT WOUND;  Surgeon: Angelia Mould, MD;  Location: [place];  Service: Vascular;  Laterality: Right;  . INTRAOPERATIVE ARTERIOGRAM Right 09/10/2017   Procedure: INTRA OPERATIVE ARTERIOGRAM;  Surgeon: Angelia Mould, MD;  Location: [place];  Service: Vascular;  Laterality: Right;  . IR ANGIOGRAM FOLLOW UP STUDY  09/09/2017  . IR FLUORO GUIDE CV LINE RIGHT  05/31/2020  . IR US GUIDE VASC ACCESS RIGHT  06/01/2020  . LEFT HEART CATHETERIZATION WITH CORONARY ANGIOGRAM N/A 11/09/2014   Procedure: LEFT HEART CATHETERIZATION WITH CORONARY ANGIOGRAM;  Surgeon: Laverda Page, MD;  Location: [facility] CATH LAB;  Service: Cardiovascular;  Laterality: N/A;  . LOWER EXTREMITY ANGIOGRAM Right 11/08/2015   Procedure: Lower Extremity Angiogram;  Surgeon: Serafina Mitchell, MD;  Location: [place] [facility];  Service: Cardiovascular;  Laterality: Right;  . LUMBAR LAMINECTOMY  06/13/2012   Procedure: MICRODISCECTOMY LUMBAR LAMINECTOMY;  Surgeon: Jessy Oto, MD;  Location: [place];  Service: Orthopedics;  Laterality: N/A;  Central laminectomy L2-3, L3-4, L4-5  . PACEMAKER INSERTION  10/08/14   MDT Advisa MRI compatible dual chamber pacemaker implanted by Dr Caryl Comes for syncope with post-termination pauses  . PERCUTANEOUS CORONARY STENT INTERVENTION (PCI-S)  11/09/2014   des to lad & distal  circumflex         Dr  [name]  . PERIPHERAL VASCULAR BALLOON ANGIOPLASTY Right 11/11/2019   Procedure: PERIPHERAL VASCULAR BALLOON ANGIOPLASTY;  Surgeon: Marty Heck, MD;  Location: [place] [facility];  Service: Cardiovascular;  Laterality: Right;  below knee popliteal, tibioperoneal trunk, posterior tibial arteries  . PERIPHERAL VASCULAR CATHETERIZATION N/A 11/08/2015   Procedure: Abdominal Aortogram;  Surgeon: Serafina Mitchell, MD;  Location: [place] [facility];  Service: Cardiovascular;  Laterality: N/A;  . PERIPHERAL VASCULAR CATHETERIZATION Right 11/08/2015   Procedure: Peripheral Vascular Atherectomy;  Surgeon: Serafina Mitchell, MD;  Location: [place] [facility];  Service: Cardiovascular;  Laterality: Right;  . PERIPHERAL VASCULAR CATHETERIZATION N/A 10/10/2016   Procedure: Lower Extremity Angiography;  Surgeon: Waynetta Sandy, MD;  Location: [place] [facility];  Service: Cardiovascular;  Laterality: N/A;  . PERIPHERAL VASCULAR CATHETERIZATION  Right 10/10/2016   Procedure: Peripheral Vascular Atherectomy;  Surgeon: Waynetta Sandy, MD;  Location: [place] [facility];  Service: Cardiovascular;  Laterality: Right;  Popliteal  . PERMANENT PACEMAKER INSERTION N/A 10/08/2014   Procedure: PERMANENT PACEMAKER INSERTION;  Surgeon: Deboraha Sprang, MD;  Location[facility];  Service: Cardiovascular;  Laterality: N/A;  . TEMPORARY PACEMAKER INSERTION Bilateral 10/07/2014   Procedure: TEMPORARY PACEMAKER INSERTION;  Surgeon: Laverda Page, MD;  Location: [facility] CATH LAB;  Service: Cardiovascular;  Laterality: Bilateral;  . WOUND DEBRIDEMENT Right 02/19/2020   Procedure: DEBRIDEMENT WOUND RIGHT FOOT;  Surgeon: Angelia Mould, MD;  Location: [facility];  Service: Vascular;  Laterality: Right;    FAMILY HISTORY: The patient's family history includes Breast cancer in his sister; Diabetes in his brother, mother, and sister; Diabetes type II in his brother, mother, sister, and  sister; Hypertension in his brother, mother, and sister; Kidney failure in his brother; Liver cancer in his father.   SOCIAL HISTORY:  The patient  reports that he quit smoking about 5 years ago. His smoking use included cigarettes. He has a 45.00 pack-year smoking history. He has never used smokeless tobacco. He reports current alcohol use of about 6.0 standard drinks of alcohol per week. He reports that he does not use drugs.  Review of Systems  Constitutional: Negative for chills and fever.  HENT: Negative for hoarse voice and nosebleeds.   Eyes: Negative for discharge, double vision and pain.  Cardiovascular: Negative for chest pain, claudication, dyspnea on exertion, leg swelling, near-syncope, orthopnea, palpitations, paroxysmal nocturnal dyspnea and syncope.  Respiratory: Negative for hemoptysis and shortness of breath.   Musculoskeletal: Negative for muscle cramps and myalgias.  Gastrointestinal: Negative for abdominal pain, constipation, diarrhea, hematemesis, hematochezia, melena, nausea and vomiting.  Neurological: Negative for dizziness and light-headedness.    PHYSICAL EXAM: Vitals with BMI 07/13/2020 06/29/2020 06/05/2020  Height _0  _1  -  Weight 270 lbs 266 lbs -  BMI 44.01 02.72 -  Systolic 536 - 644  Diastolic 59 - 69  Pulse 74 - 74   CONSTITUTIONAL: Appears older than stated age, well-developed and well-nourished. No acute distress.  SKIN: Skin is warm and dry. No rash noted. No cyanosis. No pallor. No jaundice HEAD: Normocephalic and atraumatic.  EYES: No scleral icterus MOUTH/THROAT: Moist oral membranes.  NECK: No JVD present. No thyromegaly noted. No carotid bruits  LYMPHATIC: No visible cervical adenopathy.  CHEST Normal respiratory effort. No intercostal retractions.  Pacemaker in the left infraclavicular region site is clean dry and intact. Dialysis catheter present anterior chest wall.  LUNGS: Clear to auscultation bilaterally.  No stridor. No wheezes. No  rales.  CARDIOVASCULAR: Regular, positive S1-S2, no murmurs rubs or gallops appreciated ABDOMINAL: Soft, nontender, nondistended, positive bowel sounds in all 4 quadrants, no apparent ascites.  EXTREMITIES: Right BKA, +1 pitting edema in the left lower extremity, difficult to palpate dorsalis pedis posterior tibial pulses on the left side, prior surgical site well-healed. HEMATOLOGIC: No significant bruising NEUROLOGIC: Oriented to person, place, and time. Nonfocal. Normal muscle tone.  PSYCHIATRIC: Normal mood and affect. Normal behavior. Cooperative  CARDIAC DATABASE: Pacemaker in situ: S/P Medtronic advica DR (MRI compatible) dual-chamber pacemaker by Dr. Caryl Comes on 10/07/2014 Remote pacemaker check 04/21/20: One VHR episode, EKG shows 21 beat NSVT. 06/010/2021: 12 Sec NSVT. One AHR episode consistent with AF for 00:08:15:50. There was 0.3% cumulative atrial arrhythmia burden. Normal function.  RA paced 36 and RV paced <0.1%. Patient is on Eliquis. H/O  prior NSVT. Will consider stress testing.  Unscheduled (Alert)  06/15/20: The patient experienced a recurrence of atrial fibrillation, persisting since 06/03/20 (04:54 am). There were several PAF episodes on 7/20, 7/22 and 06/03/20 prior to the persistent episode, lasting up to 4 hours and 12 minutes in duration. There were 0 high ventricular rate episodes detected. Health trends do not demonstrate significant abnormality. Battery longevity is 4 years. RA pacing is 44.5 %, RV pacing is 8.6 %. Patient on Eliquis.   EKG: 04/22/2020: Normal sinus rhythm, 72 bpm, left axis deviation, left anterior fascicular block, right bundle branch block, poor R wave progression.  No significant change compared to prior ECG dated 10/23/2019.  07/13/2020: Atrial fibrillation, 84 bpm, left axis deviation, right bundle branch block, left anterior fascicular block. Since last ECG sinus rhythm is transition to A. fib.  Echocardiogram: 03/15/2016: Left ventricle cavity  is normal in size. Severe concentric hypertrophy of the left ventricle. Normal global wall motion. Normal diastolic filling pattern. Calculated EF 66%. Left atrial cavity is mildly dilated. Right ventricle cavity is normal in size. Normal right ventricular function. Pacemaker lead/ICD lead noted in the RV. Trace mitral regurgitation. Trace pulmonic regurgitation.  05/24/2020: LVEF 60-65%, no regional wall motion of normalities, severe left ventricular hypertrophy, grade 2 diastolic impairment, mildly elevated PASP, moderately dilated biatrial cavities, mild MR.   Stress Testing:  Lexiscan myoview stress test 01/27/2016: 1. The resting electrocardiogram demonstrated normal sinus rhythm, normal resting conduction, no resting arrhythmias and normal rest repolarization. Stress EKG is non-diagnostic for ischemia as it a pharmacologic stress using Lexiscan. Stress symptoms included dyspnea. 2. The perfusion imaging study demonstrates a moderate-sized defect in the inferior wall extending from the base towards the apex, the defect slightly improved in stress images, suggests soft tissue attenuation. However scar in this region could not excluded. Left ventricular systolic function Calculated by QGS was 72%. This is a low risk study. Clinical correlation is recommended.  Heart Catheterization: Coronary angiogram 11/09/2014: CSI diamont back atherectomy to Mid LAD and Distal dominant Cx and stenting 3.0x26m and 2.5x120mPromus DES. RCA long stenosis and small vessel (angio 10/07/14) left alone (medical management only). LVEF 60%.   LABORATORY DATA: CBC Latest Ref Rng & Units 06/04/2020 06/02/2020 06/01/2020  WBC 4.0 - 10.5 K/uL 10.4 7.2 7.1  Hemoglobin 13.0 - 17.0 g/dL 9.8(L) 9.3(L) 9.9(L)  Hematocrit 39 - 52 % 30.2(L) 28.9(L) 29.7(L)  Platelets 150 - 400 K/uL 114(L) 112(L) 144(L)    CMP Latest Ref Rng & Units 06/04/2020 06/03/2020 06/02/2020  Glucose 70 - 99 mg/dL 377(H) 245(H) 235(H)  BUN 8 - 23 mg/dL  94(H) 76(H) 103(H)  Creatinine 0.61 - 1.24 mg/dL 5.85(H) 5.15(H) 6.15(H)  Sodium 135 - 145 mmol/L 134(L) 136 136  Potassium 3.5 - 5.1 mmol/L 4.6 4.1 4.4  Chloride 98 - 111 mmol/L 102 101 102  CO2 22 - 32 mmol/L 20(L) 27 24  Calcium 8.9 - 10.3 mg/dL 7.8(L) 7.9(L) 7.7(L)  Total Protein 6.5 - 8.1 g/dL - - -  Total Bilirubin 0.3 - 1.2 mg/dL - - -  Alkaline Phos 38 - 126 U/L - - -  AST 15 - 41 U/L - - -  ALT 0 - 44 U/L - - -    Lipid Panel     Component Value Date/Time   CHOL 147 11/20/2013 1422   TRIG 160 (H) 11/20/2013 1422   HDL 30 (L) 11/20/2013 1422   CHOLHDL 4.9 11/20/2013 1422   VLDL 32 11/20/2013 1422   LDLCALC  85 11/20/2013 1422    Lab Results  Component Value Date   HGBA1C 5.5 05/23/2020   HGBA1C 8.6 (H) 01/27/2020   HGBA1C 6.5 (H) 09/10/2017   No components found for: NTPROBNP Lab Results  Component Value Date   TSH 1.480 10/08/2014   TSH 4.760 (H) 05/01/2014   TSH 2.048 10/21/2013    Cardiac Panel (last 3 results) No results for input(s): CKTOTAL, CKMB, TROPONINIHS, RELINDX in the last 72 hours.  IMPRESSION:    ICD-10-CM   1. Paroxysmal atrial fibrillation (HCC)  I48.0 DISCONTINUED: apixaban (ELIQUIS) 5 MG TABS tablet  2. Atrial fibrillation, unspecified type (HCC)  I48.91 EKG 12-Lead  3. NSVT (nonsustained ventricular tachycardia) (HCC)  I47.2 PCV MYOCARDIAL PERFUSION WITH LEXISCAN  4. Presence of permanent cardiac pacemaker  Z95.0   5. Atherosclerosis of native coronary artery of native heart without angina pectoris  I25.10 isosorbide mononitrate (IMDUR) 30 MG 24 hr tablet    PCV MYOCARDIAL PERFUSION WITH LEXISCAN  6. Atherosclerosis of native artery of lower extremity with gangrene, unspecified laterality ([place])  I70.269   7. Status post below-knee amputation of right lower extremity ([place])  Z89.511   8. PAD (peripheral artery disease) (HCC)  I73.9   9. Essential hypertension, benign  I10 isosorbide mononitrate (IMDUR) 30 MG 24 hr tablet  10. Long term  (current) use of anticoagulants  Z79.01 DISCONTINUED: apixaban (ELIQUIS) 5 MG TABS tablet  11. Alcohol use  Z72.89   12. Former smoker  Z87.891      RECOMMENDATIONS: AKIEM URIETA is a 59 y.o. male whose past medical history and cardiovascular risk factors include: Paroxysmal atrial fibrillation, on oral anticoagulation, coronary artery disease with prior intervention, peripheral artery disease with prior bypass, status post right BKA, pacemaker implantation secondary to complete heart block (implanted 10/07/2014), former smoker, insulin-dependent diabetes mellitus, increased alcohol use, obesity due to excess calories.  Nonsustained ventricular tachycardia:  Most recent pacemaker interrogation reviewed with the patient and his daughter.   Nuclear stress test recommended to evaluate for reversible ischemia.  Echocardiogram was also performed at [facility] reviewed and noted above for further reference.   Continue Lopressor 100 mg p.o. twice daily  Paroxysmal atrial fibrillation . Rate control: Metoprolol . Rhythm control: N/A . Thromboembolic prophylaxis: Eliquis . CHA2DS2-VASc SCORE is 3 which correlates to 3.2% risk of stroke per year (hypertension, diabetes, vascular disease). . Patient does not endorse any evidence of bleeding. Marland Kitchen Refilled Eliquis.  Long-term oral anticoagulation:  Patient does not endorse any evidence of bleeding  Reemphasized the risk of, benefits, and alternatives of oral anticoagulation.  Patient verbalized understanding.  Established coronary artery disease with prior stenting without angina pectoris:   See above  Patient requested refill on Imdur.   Patient states that this medication was prescribed by nephrology but he has ran out. Refilled Imdur for 1 month until he gets a chance to see his nephrologist. Patient is aware of the drug to drug interactions between Imdur and phosphodiesterase inhibitor medications such as Viagra/sildenafil,  Levitra/vardenafil, Cialis/tadalafil. Patient states that he does not have access to it such medications and notes that concomitant use of both classes of medications can lead to morbidity mortality and even death. He verbalized understanding and provides feedback.  Pacemaker in situ: Most recent pacemaker interrogation report reviewed with the patient as discussed above.  Educated on importance of improving his modifiable cardiovascular risk factors which include but not limited to: Decreasing alcohol consumption to no more than 2 drinks per day, blood pressure  management, lipid management, and glycemic control.  Patient verbalizes understanding.  FINAL MEDICATION LIST END OF ENCOUNTER:   Current Outpatient Medications:  .  ACCU-CHEK FASTCLIX LANCETS MISC, Check blood sugar TID & QHS, Disp: 102 each, Rfl: 2 .  amLODipine (NORVASC) 5 MG tablet, Take 5 mg by mouth daily., Disp: , Rfl:  .  atorvastatin (LIPITOR) 20 MG tablet, Take 1 tablet by mouth once daily (Patient taking differently: Take 20 mg by mouth daily. ), Disp: 90 tablet, Rfl: 2 .  blood glucose meter kit and supplies KIT, Check blood sugar TID & QHS, Disp: 1 each, Rfl: 0 .  Blood Glucose Monitoring Suppl (ACCU-CHEK ADVANTAGE DIABETES) kit, Use as instructed, Disp: 1 each, Rfl: 0 .  cyclophosphamide (CYTOXAN) 50 MG capsule, Take 50 mg by mouth daily. Take with food to minimize GI upset. Take early in the day and maintain hydration., Disp: , Rfl:  .  glucose blood (ACCU-CHEK AVIVA PLUS) test strip, Use as instructed, Disp: 100 each, Rfl: 12 .  glucose blood (CHOICE DM FORA G20 TEST STRIPS) test strip, Check blood sugar TID & QHS, Disp: 100 each, Rfl: 12 .  glucose blood test strip, Use as instructed, Disp: 100 each, Rfl: 12 .  hydrALAZINE (APRESOLINE) 100 MG tablet, TAKE 1 TABLET BY MOUTH THREE TIMES DAILY (Patient taking differently: Take 100 mg by mouth 3 (three) times daily. ), Disp: 270 tablet, Rfl: 0 .  insulin NPH-regular Human  (70-30) 100 UNIT/ML injection, Inject 46-56 Units into the skin daily with breakfast., Disp: , Rfl:  .  metoprolol tartrate (LOPRESSOR) 100 MG tablet, Take 100 mg by mouth 2 (two) times daily., Disp: , Rfl:  .  prazosin (MINIPRESS) 2 MG capsule, Take 1 capsule (2 mg total) by mouth daily after supper., Disp: 90 capsule, Rfl: 1 .  apixaban (ELIQUIS) 5 MG TABS tablet, Take 1 tablet (5 mg total) by mouth 2 (two) times daily., Disp: 180 tablet, Rfl: 0 .  isosorbide mononitrate (IMDUR) 30 MG 24 hr tablet, Take 1 tablet (30 mg total) by mouth daily., Disp: 30 tablet, Rfl: 0 .  pantoprazole (PROTONIX) 40 MG tablet, Take 1 tablet (40 mg total) by mouth 2 (two) times daily for 20 days. (Patient not taking: Reported on 07/13/2020), Disp: 40 tablet, Rfl: 0  Orders Placed This Encounter  Procedures  . PCV MYOCARDIAL PERFUSION WITH LEXISCAN  . EKG 12-Lead   --Continue cardiac medications as reconciled in final medication list. --Return in about 6 weeks (around 08/24/2020) for review MPI results.. Or sooner if needed. --Continue follow-up with your primary care physician regarding the management of your other chronic comorbid conditions.  Patient's questions and concerns were addressed to his satisfaction. He voices understanding of the instructions provided during this encounter.   This note was created using a voice recognition software as a result there may be grammatical errors inadvertently enclosed that do not reflect the nature of this encounter. Every attempt is made to correct such errors.  Total time spent: 40 minutes  Mechele Claude [facility]  Pager: [phone] Office: [phone]

## 2020-07-14 DIAGNOSIS — N179 Acute kidney failure, unspecified: Secondary | ICD-10-CM | POA: Diagnosis not present

## 2020-07-18 ENCOUNTER — Other Ambulatory Visit: Payer: Self-pay

## 2020-07-18 ENCOUNTER — Inpatient Hospital Stay (HOSPITAL_COMMUNITY)
Admission: EM | Admit: 2020-07-18 | Discharge: 2020-07-23 | DRG: 808 | Disposition: A | Discharge status: Home / Self Care | Payer: Medicare Other | Attending: Internal Medicine | Admitting: Internal Medicine

## 2020-07-18 ENCOUNTER — Other Ambulatory Visit: Payer: Self-pay | Admitting: Physician Assistant

## 2020-07-18 DIAGNOSIS — N009 Acute nephritic syndrome with unspecified morphologic changes: Secondary | ICD-10-CM | POA: Diagnosis not present

## 2020-07-18 DIAGNOSIS — R195 Other fecal abnormalities: Secondary | ICD-10-CM | POA: Diagnosis not present

## 2020-07-18 DIAGNOSIS — I132 Hypertensive heart and chronic kidney disease with heart failure and with stage 5 chronic kidney disease, or end stage renal disease: Secondary | ICD-10-CM | POA: Diagnosis not present

## 2020-07-18 DIAGNOSIS — Z6835 Body mass index (BMI) 35.0-35.9, adult: Secondary | ICD-10-CM

## 2020-07-18 DIAGNOSIS — Z992 Dependence on renal dialysis: Secondary | ICD-10-CM

## 2020-07-18 DIAGNOSIS — N186 End stage renal disease: Secondary | ICD-10-CM | POA: Diagnosis present

## 2020-07-18 DIAGNOSIS — D61810 Antineoplastic chemotherapy induced pancytopenia: Principal | ICD-10-CM | POA: Diagnosis present

## 2020-07-18 DIAGNOSIS — T451X5A Adverse effect of antineoplastic and immunosuppressive drugs, initial encounter: Secondary | ICD-10-CM | POA: Diagnosis not present

## 2020-07-18 DIAGNOSIS — Z841 Family history of disorders of kidney and ureter: Secondary | ICD-10-CM

## 2020-07-18 DIAGNOSIS — I5032 Chronic diastolic (congestive) heart failure: Secondary | ICD-10-CM | POA: Diagnosis not present

## 2020-07-18 DIAGNOSIS — D62 Acute posthemorrhagic anemia: Secondary | ICD-10-CM | POA: Diagnosis not present

## 2020-07-18 DIAGNOSIS — I739 Peripheral vascular disease, unspecified: Secondary | ICD-10-CM | POA: Diagnosis not present

## 2020-07-18 DIAGNOSIS — E1136 Type 2 diabetes mellitus with diabetic cataract: Secondary | ICD-10-CM | POA: Diagnosis present

## 2020-07-18 DIAGNOSIS — K3189 Other diseases of stomach and duodenum: Secondary | ICD-10-CM | POA: Diagnosis present

## 2020-07-18 DIAGNOSIS — K2980 Duodenitis without bleeding: Secondary | ICD-10-CM | POA: Diagnosis present

## 2020-07-18 DIAGNOSIS — E785 Hyperlipidemia, unspecified: Secondary | ICD-10-CM | POA: Diagnosis present

## 2020-07-18 DIAGNOSIS — I7789 Other specified disorders of arteries and arterioles: Secondary | ICD-10-CM | POA: Diagnosis present

## 2020-07-18 DIAGNOSIS — K259 Gastric ulcer, unspecified as acute or chronic, without hemorrhage or perforation: Secondary | ICD-10-CM | POA: Diagnosis present

## 2020-07-18 DIAGNOSIS — Z87891 Personal history of nicotine dependence: Secondary | ICD-10-CM

## 2020-07-18 DIAGNOSIS — Z89511 Acquired absence of right leg below knee: Secondary | ICD-10-CM

## 2020-07-18 DIAGNOSIS — Z45018 Encounter for adjustment and management of other part of cardiac pacemaker: Secondary | ICD-10-CM | POA: Diagnosis not present

## 2020-07-18 DIAGNOSIS — I2510 Atherosclerotic heart disease of native coronary artery without angina pectoris: Secondary | ICD-10-CM | POA: Diagnosis present

## 2020-07-18 DIAGNOSIS — Z950 Presence of cardiac pacemaker: Secondary | ICD-10-CM | POA: Diagnosis not present

## 2020-07-18 DIAGNOSIS — E1122 Type 2 diabetes mellitus with diabetic chronic kidney disease: Secondary | ICD-10-CM | POA: Diagnosis present

## 2020-07-18 DIAGNOSIS — I459 Conduction disorder, unspecified: Secondary | ICD-10-CM | POA: Diagnosis present

## 2020-07-18 DIAGNOSIS — D61818 Other pancytopenia: Secondary | ICD-10-CM | POA: Diagnosis not present

## 2020-07-18 DIAGNOSIS — E1151 Type 2 diabetes mellitus with diabetic peripheral angiopathy without gangrene: Secondary | ICD-10-CM | POA: Diagnosis present

## 2020-07-18 DIAGNOSIS — Z800 Family history of malignant neoplasm of digestive organs: Secondary | ICD-10-CM

## 2020-07-18 DIAGNOSIS — K2970 Gastritis, unspecified, without bleeding: Secondary | ICD-10-CM | POA: Diagnosis not present

## 2020-07-18 DIAGNOSIS — Y929 Unspecified place or not applicable: Secondary | ICD-10-CM | POA: Diagnosis not present

## 2020-07-18 DIAGNOSIS — N179 Acute kidney failure, unspecified: Secondary | ICD-10-CM | POA: Diagnosis present

## 2020-07-18 DIAGNOSIS — I4891 Unspecified atrial fibrillation: Secondary | ICD-10-CM | POA: Diagnosis not present

## 2020-07-18 DIAGNOSIS — D649 Anemia, unspecified: Secondary | ICD-10-CM | POA: Diagnosis not present

## 2020-07-18 DIAGNOSIS — D631 Anemia in chronic kidney disease: Secondary | ICD-10-CM | POA: Diagnosis not present

## 2020-07-18 DIAGNOSIS — K921 Melena: Secondary | ICD-10-CM | POA: Diagnosis not present

## 2020-07-18 DIAGNOSIS — I482 Chronic atrial fibrillation, unspecified: Secondary | ICD-10-CM | POA: Diagnosis present

## 2020-07-18 DIAGNOSIS — Z888 Allergy status to other drugs, medicaments and biological substances status: Secondary | ICD-10-CM

## 2020-07-18 DIAGNOSIS — I120 Hypertensive chronic kidney disease with stage 5 chronic kidney disease or end stage renal disease: Secondary | ICD-10-CM | POA: Diagnosis not present

## 2020-07-18 DIAGNOSIS — E1129 Type 2 diabetes mellitus with other diabetic kidney complication: Secondary | ICD-10-CM | POA: Diagnosis not present

## 2020-07-18 DIAGNOSIS — I495 Sick sinus syndrome: Secondary | ICD-10-CM | POA: Diagnosis not present

## 2020-07-18 DIAGNOSIS — Z7901 Long term (current) use of anticoagulants: Secondary | ICD-10-CM | POA: Diagnosis not present

## 2020-07-18 DIAGNOSIS — I480 Paroxysmal atrial fibrillation: Secondary | ICD-10-CM | POA: Diagnosis present

## 2020-07-18 DIAGNOSIS — Z833 Family history of diabetes mellitus: Secondary | ICD-10-CM

## 2020-07-18 DIAGNOSIS — N2581 Secondary hyperparathyroidism of renal origin: Secondary | ICD-10-CM | POA: Diagnosis not present

## 2020-07-18 DIAGNOSIS — Z7982 Long term (current) use of aspirin: Secondary | ICD-10-CM

## 2020-07-18 DIAGNOSIS — D696 Thrombocytopenia, unspecified: Secondary | ICD-10-CM | POA: Diagnosis not present

## 2020-07-18 DIAGNOSIS — Z955 Presence of coronary angioplasty implant and graft: Secondary | ICD-10-CM

## 2020-07-18 DIAGNOSIS — E669 Obesity, unspecified: Secondary | ICD-10-CM | POA: Diagnosis present

## 2020-07-18 DIAGNOSIS — Z79899 Other long term (current) drug therapy: Secondary | ICD-10-CM

## 2020-07-18 DIAGNOSIS — Z794 Long term (current) use of insulin: Secondary | ICD-10-CM

## 2020-07-18 DIAGNOSIS — I452 Bifascicular block: Secondary | ICD-10-CM | POA: Diagnosis not present

## 2020-07-18 DIAGNOSIS — Z20822 Contact with and (suspected) exposure to covid-19: Secondary | ICD-10-CM | POA: Diagnosis present

## 2020-07-18 DIAGNOSIS — Z8249 Family history of ischemic heart disease and other diseases of the circulatory system: Secondary | ICD-10-CM

## 2020-07-18 DIAGNOSIS — B9681 Helicobacter pylori [H. pylori] as the cause of diseases classified elsewhere: Secondary | ICD-10-CM | POA: Diagnosis present

## 2020-07-18 DIAGNOSIS — K922 Gastrointestinal hemorrhage, unspecified: Secondary | ICD-10-CM | POA: Diagnosis not present

## 2020-07-18 DIAGNOSIS — E8889 Other specified metabolic disorders: Secondary | ICD-10-CM | POA: Diagnosis present

## 2020-07-18 DIAGNOSIS — Z89422 Acquired absence of other left toe(s): Secondary | ICD-10-CM

## 2020-07-18 DIAGNOSIS — Z803 Family history of malignant neoplasm of breast: Secondary | ICD-10-CM

## 2020-07-18 DIAGNOSIS — I509 Heart failure, unspecified: Secondary | ICD-10-CM | POA: Diagnosis not present

## 2020-07-18 LAB — COMPREHENSIVE METABOLIC PANEL
ALT: 22 U/L (ref 0–44)
AST: 18 U/L (ref 15–41)
Albumin: 3.2 g/dL — ABNORMAL LOW (ref 3.5–5.0)
Alkaline Phosphatase: 65 U/L (ref 38–126)
Anion gap: 11 (ref 5–15)
BUN: 31 mg/dL — ABNORMAL HIGH (ref 8–23)
CO2: 29 mmol/L (ref 22–32)
Calcium: 8.8 mg/dL — ABNORMAL LOW (ref 8.9–10.3)
Chloride: 95 mmol/L — ABNORMAL LOW (ref 98–111)
Creatinine, Ser: 5.21 mg/dL — ABNORMAL HIGH (ref 0.61–1.24)
GFR calc Af Amer: 13 mL/min — ABNORMAL LOW (ref >60)
GFR calc non Af Amer: 11 mL/min — ABNORMAL LOW (ref >60)
Glucose, Bld: 267 mg/dL — ABNORMAL HIGH (ref 70–99)
Potassium: 3.8 mmol/L (ref 3.5–5.1)
Sodium: 135 mmol/L (ref 135–145)
Total Bilirubin: 0.8 mg/dL (ref 0.3–1.2)
Total Protein: 5.5 g/dL — ABNORMAL LOW (ref 6.5–8.1)

## 2020-07-18 LAB — CBC
HCT: 18.3 % — ABNORMAL LOW (ref 39.0–52.0)
Hemoglobin: 5.6 g/dL — CL (ref 13.0–17.0)
MCH: 29 pg (ref 26.0–34.0)
MCHC: 30.6 g/dL (ref 30.0–36.0)
MCV: 94.8 fL (ref 80.0–100.0)
Platelets: 80 10*3/uL — ABNORMAL LOW (ref 150–400)
RBC: 1.93 MIL/uL — ABNORMAL LOW (ref 4.22–5.81)
RDW: 17 % — ABNORMAL HIGH (ref 11.5–15.5)
WBC: 3 10*3/uL — ABNORMAL LOW (ref 4.0–10.5)
nRBC: 0.7 % — ABNORMAL HIGH (ref 0.0–0.2)

## 2020-07-18 LAB — SARS CORONAVIRUS 2 BY RT PCR (HOSPITAL ORDER, PERFORMED IN ~~LOC~~ HOSPITAL LAB): SARS Coronavirus 2: NEGATIVE

## 2020-07-18 LAB — GLUCOSE, CAPILLARY: Glucose-Capillary: 145 mg/dL — ABNORMAL HIGH (ref 70–99)

## 2020-07-18 LAB — PREPARE RBC (CROSSMATCH)

## 2020-07-18 LAB — POC OCCULT BLOOD, ED: Fecal Occult Bld: POSITIVE — AB

## 2020-07-18 MED ORDER — METOPROLOL TARTRATE 50 MG PO TABS
100.0000 mg | ORAL_TABLET | Freq: Two times a day (BID) | ORAL | Status: DC
  Administered 2020-07-18 – 2020-07-23 (×9): 100 mg via ORAL
  Filled 2020-07-18 (×9): qty 2

## 2020-07-18 MED ORDER — ATORVASTATIN CALCIUM 10 MG PO TABS
20.0000 mg | ORAL_TABLET | Freq: Every day | ORAL | Status: DC
  Administered 2020-07-18 – 2020-07-23 (×5): 20 mg via ORAL
  Filled 2020-07-18 (×5): qty 2

## 2020-07-18 MED ORDER — SODIUM CHLORIDE 0.9 % IV SOLN: 10.0000 mL/h | Freq: Once | INTRAVENOUS | Status: DC

## 2020-07-18 MED ORDER — PANTOPRAZOLE SODIUM 40 MG IV SOLR
40.0000 mg | Freq: Two times a day (BID) | INTRAVENOUS | Status: DC
  Administered 2020-07-18 – 2020-07-19 (×2): 40 mg via INTRAVENOUS
  Filled 2020-07-18 (×2): qty 40

## 2020-07-18 MED ORDER — PRAZOSIN HCL 2 MG PO CAPS
2.0000 mg | ORAL_CAPSULE | Freq: Every day | ORAL | Status: DC
  Administered 2020-07-20 – 2020-07-22 (×3): 2 mg via ORAL
  Filled 2020-07-18 (×6): qty 1

## 2020-07-18 MED ORDER — AMLODIPINE BESYLATE 5 MG PO TABS
5.0000 mg | ORAL_TABLET | Freq: Every day | ORAL | Status: DC
  Administered 2020-07-19: 5 mg via ORAL
  Filled 2020-07-18: qty 1

## 2020-07-18 MED ORDER — INSULIN ASPART 100 UNIT/ML ~~LOC~~ SOLN
0.0000 [IU] | Freq: Three times a day (TID) | SUBCUTANEOUS | Status: DC
  Administered 2020-07-19 (×2): 2 [IU] via SUBCUTANEOUS
  Administered 2020-07-19: 5 [IU] via SUBCUTANEOUS
  Administered 2020-07-20: 3 [IU] via SUBCUTANEOUS
  Administered 2020-07-20: 2 [IU] via SUBCUTANEOUS
  Administered 2020-07-21 – 2020-07-22 (×2): 5 [IU] via SUBCUTANEOUS
  Administered 2020-07-22: 3 [IU] via SUBCUTANEOUS
  Administered 2020-07-22: 5 [IU] via SUBCUTANEOUS
  Administered 2020-07-23: 2 [IU] via SUBCUTANEOUS

## 2020-07-18 MED ORDER — ONDANSETRON HCL 4 MG/2ML IJ SOLN: 4.0000 mg | Freq: Four times a day (QID) | INTRAMUSCULAR | Status: DC | PRN

## 2020-07-18 MED ORDER — HYDRALAZINE HCL 50 MG PO TABS
100.0000 mg | ORAL_TABLET | Freq: Three times a day (TID) | ORAL | Status: DC
  Administered 2020-07-18 – 2020-07-22 (×10): 100 mg via ORAL
  Filled 2020-07-18 (×12): qty 2

## 2020-07-18 MED ORDER — INSULIN GLARGINE 100 UNIT/ML ~~LOC~~ SOLN
20.0000 [IU] | Freq: Two times a day (BID) | SUBCUTANEOUS | Status: DC
  Administered 2020-07-19 – 2020-07-23 (×8): 20 [IU] via SUBCUTANEOUS
  Filled 2020-07-18 (×11): qty 0.2

## 2020-07-18 MED ORDER — ONDANSETRON HCL 4 MG PO TABS
4.0000 mg | ORAL_TABLET | Freq: Four times a day (QID) | ORAL | Status: DC | PRN
  Filled 2020-07-18: qty 1

## 2020-07-18 MED ORDER — ACETAMINOPHEN 325 MG PO TABS: 650.0000 mg | ORAL_TABLET | Freq: Four times a day (QID) | ORAL | Status: DC | PRN

## 2020-07-18 MED ORDER — ISOSORBIDE MONONITRATE ER 30 MG PO TB24
30.0000 mg | ORAL_TABLET | Freq: Every day | ORAL | Status: DC
  Administered 2020-07-19 – 2020-07-23 (×4): 30 mg via ORAL
  Filled 2020-07-18 (×4): qty 1

## 2020-07-18 MED ORDER — SODIUM CHLORIDE 0.9% FLUSH
3.0000 mL | Freq: Two times a day (BID) | INTRAVENOUS | Status: DC
  Administered 2020-07-18 – 2020-07-22 (×7): 3 mL via INTRAVENOUS

## 2020-07-18 MED ORDER — INSULIN ASPART 100 UNIT/ML ~~LOC~~ SOLN
0.0000 [IU] | Freq: Every day | SUBCUTANEOUS | Status: DC
  Administered 2020-07-22: 2 [IU] via SUBCUTANEOUS

## 2020-07-18 MED ORDER — HYDRALAZINE HCL 20 MG/ML IJ SOLN: 10.0000 mg | Freq: Four times a day (QID) | INTRAMUSCULAR | Status: DC | PRN

## 2020-07-18 MED ORDER — ACETAMINOPHEN 650 MG RE SUPP: 650.0000 mg | Freq: Four times a day (QID) | RECTAL | Status: DC | PRN

## 2020-07-18 NOTE — ED Provider Notes (Addendum)
[facility] EMERGENCY DEPARTMENT Provider Note   CSN: [id number] Arrival date & time: 07/18/20  1255     History Chief Complaint  Patient presents with  . Abnormal Lab    Alexander [name] is a 59 y.o. male with history of ESRD on dialysis Tuesday Thursday Saturday, coronary artery disease, diabetes mellitus, hypertension, hyperlipidemia, peripheral vascular disease status post right BKA presents for evaluation of acute onset, persistent malaise for 3 days.  He tells me that he has not been feeling well but has a difficult time elaborating.  He states that he had blood work drawn at dialysis and received a phone call today notifying him that his hemoglobin was 5.8 and instructing him to come to the ED for further evaluation.  He denies shortness of breath, lightheadedness, syncope, abdominal pain, blood in the urine or stool.  He was recently started on dialysis in July after an admission for dark tarry stools and worsening renal insufficiency.  He states that he does make urine and has not noticed any urinary symptoms.  He underwent colonoscopy on 05/25/2020 without a source of bleeding identified.  He did undergo EGD on 05/24/2020 which showed findings concerning for duodenitis.  He is currently on Eliquis for paroxysmal A. fib and reports compliance with this medication.  Also of note the patient was on cyclophosphamide while in the hospital and states that he took it for 1 month post discharge as well but is not currently on it.  He cannot tell me when his last dose was.   The history is provided by the patient.       Past Medical History:  Diagnosis Date  . Alcohol abuse   . Anemia   . Arthritis    "patient does not think so."  . Atrial fibrillation ([place])   . Cardiac syncope 10/07/14   rapid A fib with 8 sec pauses on converison with syncope- temp pacing wire placed then PPM  . Cataract    BILATERAL  . Chronic kidney disease   . Coronary artery disease   . Diabetes  mellitus    dx---been  awhile  . Encounter for care of pacemaker 10/08/2014  . History of blood transfusion   . Hyperlipemia   . Hypertension   . Osteomyelitis ([place])    right foot  . Peripheral vascular disease ([place])   . Presence of permanent cardiac pacemaker 10/08/2014  . Sinus node dysfunction ([place]) 10/08/2014    Patient Active Problem List   Diagnosis Date Noted  . Acute blood loss anemia 07/18/2020  . Hemodialysis patient ([place]) 06/04/2020  . Necrotizing glomerulonephritis 06/04/2020  . Occult blood in stools   . Melena   . Upper GI bleeding 05/23/2020  . Acute GI bleeding 05/23/2020  . Acute hematogenous osteomyelitis of right foot ([place]) 03/11/2020  . Gangrene of right foot ([place])   . Diabetic foot ulcer associated with type 2 diabetes mellitus ([place]) 02/19/2020  . Heel ulcer ([place]) 11/05/2019  . Atherosclerotic PVD with ulceration ([place]) 09/09/2017  . Essential hypertension, benign 02/07/2015  . Renal insufficiency 02/07/2015  . Post PTCA 11/09/2014  . S/P PTCA (percutaneous transluminal coronary angioplasty) 11/09/2014  . Pacemaker: [place] DR MRI [id number] Dual chamber pacemaker 10/08/2014 10/08/2014  . Encounter for care of pacemaker 10/08/2014  . Sinus node dysfunction ([place]) 10/08/2014  . Cardiac asystole ([place]) 10/07/2014  . Cardiac syncope 10/07/2014  . Diabetes mellitus with stage 3 chronic kidney disease ([place]) 10/07/2014  . CAD (coronary artery disease),  native coronary artery 10/07/2014  . Diabetes mellitus due to underlying condition without complications ([place]) 82/80/0349  . Anemia, unspecified 05/25/2014  . Syncope 04/30/2014  . Atherosclerosis of native arteries of the extremities with gangrene ([place]) 12/16/2013  . Volume overload 10/30/2013  . Acute renal failure ([place]) 10/28/2013  . PAD (peripheral artery disease) ([place]) 10/27/2013  . Leukocytosis 10/21/2013  . Anemia 10/21/2013  . Osteomyelitis of left foot ([place]) 08/19/2013  . Spinal stenosis, lumbar  region, with neurogenic claudication 06/12/2012    Class: Diagnosis of  . DIABETES MELLITUS, TYPE II 03/15/2009  . ALCOHOL ABUSE 03/15/2009  . TOBACCO USE 03/15/2009  . HYPERTENSION 03/15/2009    Past Surgical History:  Procedure Laterality Date  . ABDOMINAL AORTOGRAM W/LOWER EXTREMITY N/A 09/09/2017   Procedure: ABDOMINAL AORTOGRAM W/LOWER EXTREMITY;  Surgeon: Angelia Mould, MD;  Location: [facility];  Service: Cardiovascular;  Laterality: N/A;  . ABDOMINAL AORTOGRAM W/LOWER EXTREMITY Right 11/11/2019   Procedure: ABDOMINAL AORTOGRAM W/LOWER EXTREMITY;  Surgeon: Marty Heck, MD;  Location: [facility];  Service: Cardiovascular;  Laterality: Right;  . AMPUTATION Left 08/19/2013   Procedure: AMPUTATION RAY;  Surgeon: Alta Corning, MD;  Location: [facility];  Service: Orthopedics;  Laterality: Left;  ray amputation left 5th  . AMPUTATION Left 10/27/2013   Procedure: AMPUTATION DIGIT-LEFT 4TH TOE, 4th and 5th metatarsal.;  Surgeon: Angelia Mould, MD;  Location: [facility];  Service: Vascular;  Laterality: Left;  . AMPUTATION Left 11/04/2013   Procedure: LEFT FOOT TRANS-METATARSAL AMPUTATION WITH WOUND CLOSURE ;  Surgeon: Alta Corning, MD;  Location: [facility];  Service: Orthopedics;  Laterality: Left;  . AMPUTATION Right 09/10/2017   Procedure: RIGHT FOURTH AND FIFTH TOE AMPUTATION;  Surgeon: Angelia Mould, MD;  Location: [facility];  Service: Vascular;  Laterality: Right;  . AMPUTATION Right 02/19/2020   Procedure: AMPUTATION RIGHT 3RD TOE;  Surgeon: Angelia Mould, MD;  Location: [facility];  Service: Vascular;  Laterality: Right;  . AMPUTATION Right 03/11/2020   Procedure: RIGHT BELOW KNEE AMPUTATION;  Surgeon: Newt Minion, MD;  Location: [facility];  Service: Orthopedics;  Laterality: Right;  . APPLICATION OF WOUND VAC Right 02/19/2020   Procedure: Application Of Wound Vac Right foot;  Surgeon: Angelia Mould, MD;  Location: [facility];  Service: Vascular;   Laterality: Right;  . CARDIAC CATHETERIZATION  10/07/2014   Procedure: LEFT HEART CATH AND CORONARY ANGIOGRAPHY;  Surgeon: Laverda Page, MD;  Location: [facility] CATH LAB;  Service: Cardiovascular;;  . COLONOSCOPY W/ POLYPECTOMY    . COLONOSCOPY WITH PROPOFOL N/A 05/25/2020   Procedure: COLONOSCOPY WITH PROPOFOL;  Surgeon: Gatha Mayer, MD;  Location: [facility];  Service: Endoscopy;  Laterality: N/A;  . EMBOLECTOMY Right 09/12/2017   Procedure: Thrombectomy  & Redo Right Below Knee Popliteal Artery Bypass Graft  ;  Surgeon: Waynetta Sandy, MD;  Location: [facility];  Service: Vascular;  Laterality: Right;  . ESOPHAGOGASTRODUODENOSCOPY (EGD) WITH PROPOFOL N/A 05/24/2020   Procedure: ESOPHAGOGASTRODUODENOSCOPY (EGD) WITH PROPOFOL;  Surgeon: Ladene Artist, MD;  Location: [facility];  Service: Endoscopy;  Laterality: N/A;  . EYE SURGERY Bilateral    cataract  . FEMORAL-TIBIAL BYPASS GRAFT Left 10/27/2013   Procedure: BYPASS GRAFT LEFT FEMORAL- POSTERIOR TIBIAL ARTERY;  Surgeon: Angelia Mould, MD;  Location: [facility];  Service: Vascular;  Laterality: Left;  . FEMORAL-TIBIAL BYPASS GRAFT Right 09/10/2017   Procedure: RIGHT SUPERFICIAL  FEMORAL ARTERY-BELOW KNEE POPLITEAL ARTERY BYPASS GRAFT WITH VEIN;  Surgeon: Angelia Mould, MD;  [facility];  Service: Vascular;  Laterality: Right;  . I & D EXTREMITY Left 10/27/2013   Procedure: IRRIGATION AND DEBRIDEMENT EXTREMITY- LEFT FOOT;  Surgeon: Angelia Mould, MD;  Location: [facility];  Service: Vascular;  Laterality: Left;  . I & D EXTREMITY Right 01/26/2020   Procedure: IRRIGATION AND DEBRIDEMENT EXTREMITY RIGHT FOOT WOUND;  Surgeon: Angelia Mould, MD;  Location: [facility];  Service: Vascular;  Laterality: Right;  . INTRAOPERATIVE ARTERIOGRAM Right 09/10/2017   Procedure: INTRA OPERATIVE ARTERIOGRAM;  Surgeon: Angelia Mould, MD;  Location: [facility];  Service: Vascular;  Laterality: Right;  . IR ANGIOGRAM FOLLOW  UP STUDY  09/09/2017  . IR FLUORO GUIDE CV LINE RIGHT  05/31/2020  . IR US GUIDE VASC ACCESS RIGHT  06/01/2020  . LEFT HEART CATHETERIZATION WITH CORONARY ANGIOGRAM N/A 11/09/2014   Procedure: LEFT HEART CATHETERIZATION WITH CORONARY ANGIOGRAM;  Surgeon: Laverda Page, MD;  Location: [facility] CATH LAB;  Service: Cardiovascular;  Laterality: N/A;  . LOWER EXTREMITY ANGIOGRAM Right 11/08/2015   Procedure: Lower Extremity Angiogram;  Surgeon: Serafina Mitchell, MD;  Location: [facility] CV LAB;  Service: Cardiovascular;  Laterality: Right;  . LUMBAR LAMINECTOMY  06/13/2012   Procedure: MICRODISCECTOMY LUMBAR LAMINECTOMY;  Surgeon: Jessy Oto, MD;  Location: [facility];  Service: Orthopedics;  Laterality: N/A;  Central laminectomy L2-3, L3-4, L4-5  . PACEMAKER INSERTION  10/08/14   MDT Advisa MRI compatible dual chamber pacemaker implanted by Dr Caryl Comes for syncope with post-termination pauses  . PERCUTANEOUS CORONARY STENT INTERVENTION (PCI-S)  11/09/2014   des to lad & distal circumflex         Dr  [name]  . PERIPHERAL VASCULAR BALLOON ANGIOPLASTY Right 11/11/2019   Procedure: PERIPHERAL VASCULAR BALLOON ANGIOPLASTY;  Surgeon: Marty Heck, MD;  Location: [facility] LAB;  Service: Cardiovascular;  Laterality: Right;  below knee popliteal, tibioperoneal trunk, posterior tibial arteries  . PERIPHERAL VASCULAR CATHETERIZATION N/A 11/08/2015   Procedure: Abdominal Aortogram;  Surgeon: Serafina Mitchell, MD;  Location: [facility] LAB;  Service: Cardiovascular;  Laterality: N/A;  . PERIPHERAL VASCULAR CATHETERIZATION Right 11/08/2015   Procedure: Peripheral Vascular Atherectomy;  Surgeon: Serafina Mitchell, MD;  Location: [facility];  Service: Cardiovascular;  Laterality: Right;  . PERIPHERAL VASCULAR CATHETERIZATION N/A 10/10/2016   Procedure: Lower Extremity Angiography;  Surgeon: Waynetta Sandy, MD;  Location: [facility];  Service: Cardiovascular;  Laterality: N/A;  .  PERIPHERAL VASCULAR CATHETERIZATION Right 10/10/2016   Procedure: Peripheral Vascular Atherectomy;  Surgeon: Waynetta Sandy, MD;  Location: [facility];  Service: Cardiovascular;  Laterality: Right;  Popliteal  . PERMANENT PACEMAKER INSERTION N/A 10/08/2014   Procedure: PERMANENT PACEMAKER INSERTION;  Surgeon: Deboraha Sprang, MD;  Location: [facility] CATH LAB;  Service: Cardiovascular;  Laterality: N/A;  . TEMPORARY PACEMAKER INSERTION Bilateral 10/07/2014   Procedure: TEMPORARY PACEMAKER INSERTION;  Surgeon: Laverda Page, MD;  Location: [facility] CATH LAB;  Service: Cardiovascular;  Laterality: Bilateral;  . WOUND DEBRIDEMENT Right 02/19/2020   Procedure: DEBRIDEMENT WOUND RIGHT FOOT;  Surgeon: Angelia Mould, MD;  Location: [facility] OR;  Service: Vascular;  Laterality: Right;       Family History  Problem Relation Age of Onset  . Diabetes type II Mother   . Hypertension Mother   . Diabetes Mother   . Liver cancer Father   . Diabetes type II Sister   . Breast cancer Sister   .  Diabetes Sister   . Hypertension Sister   . Diabetes type II Brother   . Kidney failure Brother   . Diabetes Brother   . Hypertension Brother   . Diabetes type II Sister     Social History   Tobacco Use  . Smoking status: Former Smoker    Packs/day: 1.50    Years: 30.00    Pack years: 45.00    Types: Cigarettes    Quit date: 10/07/2014    Years since quitting: 5.7  . Smokeless tobacco: Never Used  Vaping Use  . Vaping Use: Never used  Substance Use Topics  . Alcohol use: Yes    Alcohol/week: 6.0 standard drinks    Types: 6 Cans of beer per week    Comment: 2-3 beers daily  . Drug use: No    Home Medications Prior to Admission medications   Medication Sig Start Date End Date Taking? Authorizing Provider  amLODipine (NORVASC) 5 MG tablet Take 5 mg by mouth daily. 06/09/20  Yes [provider]  apixaban (ELIQUIS) 5 MG TABS tablet Take 1 tablet (5 mg total) by mouth 2 (two)  times daily. 07/13/20 10/11/20 Yes Tolia, Sunit, DO  aspirin EC 81 MG tablet Take 81 mg by mouth daily. Swallow whole.   Yes [provider]  atorvastatin (LIPITOR) 20 MG tablet Take 1 tablet by mouth once daily Patient taking differently: Take 20 mg by mouth daily.  12/24/19  Yes Miquel Dunn, NP  hydrALAZINE (APRESOLINE) 100 MG tablet TAKE 1 TABLET BY MOUTH THREE TIMES DAILY Patient taking differently: Take 100 mg by mouth 3 (three) times daily.  02/24/20  Yes Adrian Prows, MD  insulin NPH-regular Human (70-30) 100 UNIT/ML injection Inject 46-56 Units into the skin 2 (two) times daily with a meal. Take 56 units in the morning and Take 46 in the evening   Yes [provider]  metoprolol tartrate (LOPRESSOR) 100 MG tablet Take 100 mg by mouth 2 (two) times daily.   Yes [provider]  Multiple Vitamin (MULTIVITAMIN ADULT) TABS Take 1 tablet by mouth daily.   Yes [provider]  prazosin (MINIPRESS) 2 MG capsule Take 1 capsule (2 mg total) by mouth daily after supper. 06/27/20  Yes Adrian Prows, MD  ACCU-CHEK FASTCLIX LANCETS MISC Check blood sugar TID & QHS 10/01/14   Lorayne Marek, MD  blood glucose meter kit and supplies KIT Check blood sugar TID & QHS 02/10/15   Advani, Vernon Prey, MD  Blood Glucose Monitoring Suppl (ACCU-CHEK ADVANTAGE DIABETES) kit Use as instructed 10/01/14   Lorayne Marek, MD  cyclophosphamide (CYTOXAN) 50 MG capsule Take 50 mg by mouth daily. Take with food to minimize GI upset. Take early in the day and maintain hydration.    [provider]  glucose blood (ACCU-CHEK AVIVA PLUS) test strip Use as instructed 10/01/14   Advani, Vernon Prey, MD  glucose blood (CHOICE DM FORA G20 TEST STRIPS) test strip Check blood sugar TID & QHS 02/10/15   Advani, Vernon Prey, MD  glucose blood test strip Use as instructed 11/20/13   Reyne Dumas, MD  isosorbide mononitrate (IMDUR) 30 MG 24 hr tablet Take 1 tablet (30 mg total) by mouth daily. 07/13/20 08/12/20   Tolia, Sunit, DO    Allergies    Keflex [cephalexin] and Pletal [cilostazol]  Review of Systems   Review of Systems  Constitutional: Positive for fatigue. Negative for chills and fever.  Respiratory: Negative for shortness of breath.   Cardiovascular: Negative for chest  pain.  Gastrointestinal: Negative for abdominal pain, blood in stool, nausea and vomiting.  Genitourinary: Negative for dysuria, frequency and urgency.  All other systems reviewed and are negative.   Physical Exam Updated Vital Signs BP 133/60   Pulse 68   Temp 97.9 F (36.6 C) (Oral)   Resp 10   SpO2 100%   Physical Exam Vitals and nursing note reviewed.  Constitutional:      General: He is not in acute distress.    Appearance: He is well-developed.  HENT:     Head: Normocephalic and atraumatic.  Eyes:     General:        Right eye: No discharge.        Left eye: No discharge.     Conjunctiva/sclera: Conjunctivae normal.  Neck:     Vascular: No JVD.     Trachea: No tracheal deviation.  Cardiovascular:     Rate and Rhythm: Normal rate.     Comments: Tunneled catheter to the right chest wall with no erythema, tenderness or abnormal drainage Pulmonary:     Effort: Pulmonary effort is normal.     Breath sounds: Normal breath sounds.  Abdominal:     General: There is no distension.     Palpations: Abdomen is soft.     Tenderness: There is no abdominal tenderness. There is no guarding or rebound.  Genitourinary:    Comments: Examination performed in the presence of a chaperone.  No frank rectal bleeding.  Moderate amount of brown stool in the rectal vault, does not appear overtly bloody Musculoskeletal:     Comments: Right BKA  Skin:    General: Skin is warm.     Findings: No erythema.  Neurological:     Mental Status: He is alert.  Psychiatric:        Behavior: Behavior normal.     ED Results / Procedures / Treatments   Labs (all labs ordered are listed, but only abnormal results are  displayed) Labs Reviewed  COMPREHENSIVE METABOLIC PANEL - Abnormal; Notable for the following components:      Result Value   Chloride 95 (*)    Glucose, Bld 267 (*)    BUN 31 (*)    Creatinine, Ser 5.21 (*)    Calcium 8.8 (*)    Total Protein 5.5 (*)    Albumin 3.2 (*)    GFR calc non Af Amer 11 (*)    GFR calc Af Amer 13 (*)    All other components within normal limits  CBC - Abnormal; Notable for the following components:   WBC 3.0 (*)    RBC 1.93 (*)    Hemoglobin 5.6 (*)    HCT 18.3 (*)    RDW 17.0 (*)    Platelets 80 (*)    nRBC 0.7 (*)    All other components within normal limits  POC OCCULT BLOOD, ED - Abnormal; Notable for the following components:   Fecal Occult Bld POSITIVE (*)    All other components within normal limits  SARS CORONAVIRUS 2 BY RT PCR (HOSPITAL ORDER, [facility])  CBC  MAGNESIUM  CBC  BASIC METABOLIC PANEL  TYPE AND SCREEN  PREPARE RBC (CROSSMATCH)    EKG EKG Interpretation  Date/Time:  Monday July 18 2020 16:26:13 EDT Ventricular Rate:  82 PR Interval:    QRS Duration: 165 QT Interval:  443 QTC Calculation: 518 R Axis:   -51 Text Interpretation: Atrial fibrillation RBBB and LAFB A  fib new from previous paced rhythm Confirmed by Theotis Burrow [phone]) on 07/18/2020 4:42:52 PM   Radiology No results found.  Procedures Procedures (including critical care time)  Medications Ordered in ED Medications  0.9 %  sodium chloride infusion (has no administration in time range)  amLODipine (NORVASC) tablet 5 mg (has no administration in time range)  atorvastatin (LIPITOR) tablet 20 mg (has no administration in time range)  hydrALAZINE (APRESOLINE) tablet 100 mg (has no administration in time range)  isosorbide mononitrate (IMDUR) 24 hr tablet 30 mg (has no administration in time range)  metoprolol tartrate (LOPRESSOR) tablet 100 mg (has no administration in time range)  prazosin (MINIPRESS) capsule 2 mg (has  no administration in time range)  sodium chloride flush (NS) 0.9 % injection 3 mL (has no administration in time range)  acetaminophen (TYLENOL) tablet 650 mg (has no administration in time range)    Or  acetaminophen (TYLENOL) suppository 650 mg (has no administration in time range)  ondansetron (ZOFRAN) tablet 4 mg (has no administration in time range)    Or  ondansetron (ZOFRAN) injection 4 mg (has no administration in time range)  hydrALAZINE (APRESOLINE) injection 10 mg (has no administration in time range)  insulin aspart (novoLOG) injection 0-15 Units (has no administration in time range)  insulin aspart (novoLOG) injection 0-5 Units (has no administration in time range)  insulin glargine (LANTUS) injection 20 Units (has no administration in time range)  pantoprazole (PROTONIX) injection 40 mg (has no administration in time range)    ED Course  I have reviewed the triage vital signs and the nursing notes.  Pertinent labs & imaging results that were available during my care of the patient were reviewed by me and considered in my medical decision making (see chart for details).    MDM Rules/Calculators/A&P                          Patient presenting for evaluation of abnormal lab, was told that he had a hemoglobin of 5.8 after labs were drawn at dialysis.  He is afebrile, vital signs are stable.  He is nontoxic in appearance.  He tells me that he feels generally unwell but does not have any specific complaints.  He is anticoagulated on Eliquis for paroxysmal A. fib.  DRE reassuring with no frank rectal bleeding however stools are faintly heme positive though do not appear overtly bloody on examination.  His abdomen is soft and nontender with no rebound or guarding.  Lab work in the ED today reviewed by myself shows pancytopenia with hemoglobin of 5.6, WBC count 3.0, platelet count of 80.  This could potentially be a side effect of the cyclophosphamide that he was on recently however  with heme positive stools and hemoglobin less than 7, concern for possible GI bleed.  He had an EGD in July that showed findings concerning for duodenitis.  He does appear hemodynamically stable and I do not feel that he requires emergent scope at this time.  He was transfused 2 units packed red blood cells in the ED. Remainder of lab work reviewed and interpreted by myself shows renal insufficiency consistent with history of ESRD newly on dialysis.  Does not require emergent dialysis at this time.   Spoke with Dr. Doristine Bosworth with [facility] hospitalist service who agrees to assume care of patient and bring him to the hospital for further evaluation and management.  He requests GI consult.  I placed a nonurgent consult  to Dr. Hilarie Fredrickson with [facility] with plan for a.m. GI evaluation.  Discussed with Dr. Rex Kras who agrees with assessment and plan at this time.        Final Clinical Impression(s) / ED Diagnoses Final diagnoses:  Pancytopenia ([place])  Heme positive stool    Rx / DC Orders ED Discharge Orders    None        Debroah Baller 07/18/20 2026    Little, Wenda Overland, MD 07/18/20 2247    Rex Kras, Wenda Overland, MD 07/18/20 2249

## 2020-07-18 NOTE — H&P (Signed)
History and Physical    QUINNTIN MALTER VVZ:[id number] DOB: [birth year] DOA: 07/18/2020  PCP: [name], MD  Patient coming from: Home  I have personally briefly reviewed patient's old medical records in [place]  Chief Complaint: Abnormal lab  HPI: Alexander [name] is a 59 y.o. male with medical history significant of end-stage renal disease and glomerulonephritis recently started on hemodialysis with TTS schedule, duodenitis found on recent EGD, paroxysmal atrial fibrillation on Eliquis, type 2 diabetes mellitus, hypertension who was sent to the ED due to low hemoglobin.  According to the history provided to me by ED provider as well as patient, patient had his hemodialysis on Saturday and labs were drawn (patient unable to tell whether labs were drawn before or after dialysis) and he received a call from nephrology today that his hemoglobin was 5.8 and that he need to come to ER.  Patient himself has no complaints at all.  He denies having any hematemesis or any melena.  No chest pain shortness of breath or any other complaint.  Patient was recently on Cytoxan and according to him, he took it for a month and his last dose was 2 weeks ago.  ED Course: Upon arrival to ED, he was hemodynamically stable.  CBC showed pancytopenia with hemoglobin of 5.6 and worsening of platelets.  Renal function was stable.  Electrolytes are stable.  2 units of PRBC transfusion has been ordered.  Hospitalist service were consulted to admit the patient for further management.  Dr. Hilarie Fredrickson of [facility] has been consulted by ED physician.  Review of Systems: As per HPI otherwise negative.    Past Medical History:  Diagnosis Date  . Alcohol abuse   . Anemia   . Arthritis    "patient does not think so."  . Atrial fibrillation ([place])   . Cardiac syncope 10/07/14   rapid A fib with 8 sec pauses on converison with syncope- temp pacing wire placed then PPM  . Cataract    BILATERAL  . Chronic kidney disease   .  Coronary artery disease   . Diabetes mellitus    dx---been  awhile  . Encounter for care of pacemaker 10/08/2014  . History of blood transfusion   . Hyperlipemia   . Hypertension   . Osteomyelitis ([place])    right foot  . Peripheral vascular disease ([place])   . Presence of permanent cardiac pacemaker 10/08/2014  . Sinus node dysfunction ([place]) 10/08/2014    Past Surgical History:  Procedure Laterality Date  . ABDOMINAL AORTOGRAM W/LOWER EXTREMITY N/A 09/09/2017   Procedure: ABDOMINAL AORTOGRAM W/LOWER EXTREMITY;  Surgeon: Angelia Mould, MD;  Location: [place] [facility];  Service: Cardiovascular;  Laterality: N/A;  . ABDOMINAL AORTOGRAM W/LOWER EXTREMITY Right 11/11/2019   Procedure: ABDOMINAL AORTOGRAM W/LOWER EXTREMITY;  Surgeon: Marty Heck, MD;  Location: [place] [facility];  Service: Cardiovascular;  Laterality: Right;  . AMPUTATION Left 08/19/2013   Procedure: AMPUTATION RAY;  Surgeon: Alta Corning, MD;  Location: [place];  Service: Orthopedics;  Laterality: Left;  ray amputation left 5th  . AMPUTATION Left 10/27/2013   Procedure: AMPUTATION DIGIT-LEFT 4TH TOE, 4th and 5th metatarsal.;  Surgeon: Angelia Mould, MD;  Location: [place];  Service: Vascular;  Laterality: Left;  . AMPUTATION Left 11/04/2013   Procedure: LEFT FOOT TRANS-METATARSAL AMPUTATION WITH WOUND CLOSURE ;  Surgeon: Alta Corning, MD;  Location: [place];  Service: Orthopedics;  Laterality: Left;  . AMPUTATION Right 09/10/2017  Procedure: RIGHT FOURTH AND FIFTH TOE AMPUTATION;  Surgeon: Angelia Mould, MD;  Location: [place];  Service: Vascular;  Laterality: Right;  . AMPUTATION Right 02/19/2020   Procedure: AMPUTATION RIGHT 3RD TOE;  Surgeon: Angelia Mould, MD;  Location: [place];  Service: Vascular;  Laterality: Right;  . AMPUTATION Right 03/11/2020   Procedure: RIGHT BELOW KNEE AMPUTATION;  Surgeon: Newt Minion, MD;  Location: [place];  Service: Orthopedics;  Laterality: Right;  .  APPLICATION OF WOUND VAC Right 02/19/2020   Procedure: Application Of Wound Vac Right foot;  Surgeon: Angelia Mould, MD;  Location: [place];  Service: Vascular;  Laterality: Right;  . CARDIAC CATHETERIZATION  10/07/2014   Procedure: LEFT HEART CATH AND CORONARY ANGIOGRAPHY;  Surgeon: Laverda Page, MD;  Location: [facility];  Service: Cardiovascular;;  . COLONOSCOPY W/ POLYPECTOMY    . COLONOSCOPY WITH PROPOFOL N/A 05/25/2020   Procedure: COLONOSCOPY WITH PROPOFOL;  Surgeon: Gatha Mayer, MD;  Location: [facility];  Service: Endoscopy;  Laterality: N/A;  . EMBOLECTOMY Right 09/12/2017   Procedure: Thrombectomy  & Redo Right Below Knee Popliteal Artery Bypass Graft  ;  Surgeon: Waynetta Sandy, MD;  Location: [place];  Service: Vascular;  Laterality: Right;  . ESOPHAGOGASTRODUODENOSCOPY (EGD) WITH PROPOFOL N/A 05/24/2020   Procedure: ESOPHAGOGASTRODUODENOSCOPY (EGD) WITH PROPOFOL;  Surgeon: Ladene Artist, MD;  Location: [facility];  Service: Endoscopy;  Laterality: N/A;  . EYE SURGERY Bilateral    cataract  . FEMORAL-TIBIAL BYPASS GRAFT Left 10/27/2013   Procedure: BYPASS GRAFT LEFT FEMORAL- POSTERIOR TIBIAL ARTERY;  Surgeon: Angelia Mould, MD;  Location: [place];  Service: Vascular;  Laterality: Left;  . FEMORAL-TIBIAL BYPASS GRAFT Right 09/10/2017   Procedure: RIGHT SUPERFICIAL  FEMORAL ARTERY-BELOW KNEE POPLITEAL ARTERY BYPASS GRAFT WITH VEIN;  Surgeon: Angelia Mould, MD;  Location: [place];  Service: Vascular;  Laterality: Right;  . I & D EXTREMITY Left 10/27/2013   Procedure: IRRIGATION AND DEBRIDEMENT EXTREMITY- LEFT FOOT;  Surgeon: Angelia Mould, MD;  Location: [place];  Service: Vascular;  Laterality: Left;  . I & D EXTREMITY Right 01/26/2020   Procedure: IRRIGATION AND DEBRIDEMENT EXTREMITY RIGHT FOOT WOUND;  Surgeon: Angelia Mould, MD;  Location: [place];  Service: Vascular;  Laterality: Right;  . INTRAOPERATIVE ARTERIOGRAM Right  09/10/2017   Procedure: INTRA OPERATIVE ARTERIOGRAM;  Surgeon: Angelia Mould, MD;  Location: [place];  Service: Vascular;  Laterality: Right;  . IR ANGIOGRAM FOLLOW UP STUDY  09/09/2017  . IR FLUORO GUIDE CV LINE RIGHT  05/31/2020  . IR US GUIDE VASC ACCESS RIGHT  06/01/2020  . LEFT HEART CATHETERIZATION WITH CORONARY ANGIOGRAM N/A 11/09/2014   Procedure: LEFT HEART CATHETERIZATION WITH CORONARY ANGIOGRAM;  Surgeon: Laverda Page, MD;  Location: [facility] CATH LAB;  Service: Cardiovascular;  Laterality: N/A;  . LOWER EXTREMITY ANGIOGRAM Right 11/08/2015   Procedure: Lower Extremity Angiogram;  Surgeon: Serafina Mitchell, MD;  Location: [place] [facility];  Service: Cardiovascular;  Laterality: Right;  . LUMBAR LAMINECTOMY  06/13/2012   Procedure: MICRODISCECTOMY LUMBAR LAMINECTOMY;  Surgeon: Jessy Oto, MD;  Location: [place];  Service: Orthopedics;  Laterality: N/A;  Central laminectomy L2-3, L3-4, L4-5  . PACEMAKER INSERTION  10/08/14   MDT Advisa MRI compatible dual chamber pacemaker implanted by Dr Caryl Comes for syncope with post-termination pauses  . PERCUTANEOUS CORONARY STENT INTERVENTION (PCI-S)  11/09/2014   des to lad & distal circumflex  Dr  [name]  . PERIPHERAL VASCULAR BALLOON ANGIOPLASTY Right 11/11/2019   Procedure: PERIPHERAL VASCULAR BALLOON ANGIOPLASTY;  Surgeon: Marty Heck, MD;  Location: [place] [facility];  Service: Cardiovascular;  Laterality: Right;  below knee popliteal, tibioperoneal trunk, posterior tibial arteries  . PERIPHERAL VASCULAR CATHETERIZATION N/A 11/08/2015   Procedure: Abdominal Aortogram;  Surgeon: Serafina Mitchell, MD;  Location: [place] [facility];  Service: Cardiovascular;  Laterality: N/A;  . PERIPHERAL VASCULAR CATHETERIZATION Right 11/08/2015   Procedure: Peripheral Vascular Atherectomy;  Surgeon: Serafina Mitchell, MD;  Location: [place] [facility];  Service: Cardiovascular;  Laterality: Right;  . PERIPHERAL VASCULAR CATHETERIZATION N/A  10/10/2016   Procedure: Lower Extremity Angiography;  Surgeon: Waynetta Sandy, MD;  Location: [place] [facility];  Service: Cardiovascular;  Laterality: N/A;  . PERIPHERAL VASCULAR CATHETERIZATION Right 10/10/2016   Procedure: Peripheral Vascular Atherectomy;  Surgeon: Waynetta Sandy, MD;  Location: [place] [facility];  Service: Cardiovascular;  Laterality: Right;  Popliteal  . PERMANENT PACEMAKER INSERTION N/A 10/08/2014   Procedure: PERMANENT PACEMAKER INSERTION;  Surgeon: Deboraha Sprang, MD;  Location: [facility] C[facility] LAB;  Service: Cardiovascular;  Laterality: N/A;  . TEMPORARY PACEMAKER INSERTION Bilateral 10/07/2014   Procedure: TEMPORARY PACEMAKER INSERTION;  Surgeon: Laverda Page, MD;  Location: [facility] CATH LAB;  Service: Cardiovascular;  Laterality: Bilateral;  . WOUND DEBRIDEMENT Right 02/19/2020   Procedure: DEBRIDEMENT WOUND RIGHT FOOT;  Surgeon: Angelia Mould, MD;  Location: [place];  Service: Vascular;  Laterality: Right;     reports that he quit smoking about 5 years ago. His smoking use included cigarettes. He has a 45.00 pack-year smoking history. He has never used smokeless tobacco. He reports current alcohol use of about 6.0 standard drinks of alcohol per week. He reports that he does not use drugs.  Allergies  Allergen Reactions  . Keflex [Cephalexin] Nausea And Vomiting  . Pletal [Cilostazol] Palpitations    "Can hear heart beating loudly".    Family History  Problem Relation Age of Onset  . Diabetes type II Mother   . Hypertension Mother   . Diabetes Mother   . Liver cancer Father   . Diabetes type II Sister   . Breast cancer Sister   . Diabetes Sister   . Hypertension Sister   . Diabetes type II Brother   . Kidney failure Brother   . Diabetes Brother   . Hypertension Brother   . Diabetes type II Sister     Prior to Admission medications   Medication Sig Start Date End Date Taking? Authorizing Provider  ACCU-CHEK FASTCLIX LANCETS MISC  Check blood sugar TID & QHS 10/01/14   Advani, Vernon Prey, MD  amLODipine (NORVASC) 5 MG tablet Take 5 mg by mouth daily. 06/09/20   [provider]  apixaban (ELIQUIS) 5 MG TABS tablet Take 1 tablet (5 mg total) by mouth 2 (two) times daily. 07/13/20 10/11/20  Tolia, Sunit, DO  atorvastatin (LIPITOR) 20 MG tablet Take 1 tablet by mouth once daily Patient taking differently: Take 20 mg by mouth daily.  12/24/19   Miquel Dunn, NP  blood glucose meter kit and supplies KIT Check blood sugar TID & QHS 02/10/15   Advani, Vernon Prey, MD  Blood Glucose Monitoring Suppl (ACCU-CHEK ADVANTAGE DIABETES) kit Use as instructed 10/01/14   Lorayne Marek, MD  cyclophosphamide (CYTOXAN) 50 MG capsule Take 50 mg by mouth daily. Take with food to minimize GI upset. Take early in the day and maintain hydration.  [provider]  glucose blood (ACCU-CHEK AVIVA PLUS) test strip Use as instructed 10/01/14   Advani, Vernon Prey, MD  glucose blood (CHOICE DM FORA G20 TEST STRIPS) test strip Check blood sugar TID & QHS 02/10/15   Advani, Vernon Prey, MD  glucose blood test strip Use as instructed 11/20/13   Reyne Dumas, MD  hydrALAZINE (APRESOLINE) 100 MG tablet TAKE 1 TABLET BY MOUTH THREE TIMES DAILY Patient taking differently: Take 100 mg by mouth 3 (three) times daily.  02/24/20   Adrian Prows, MD  insulin NPH-regular Human (70-30) 100 UNIT/ML injection Inject 46-56 Units into the skin daily with breakfast.    [provider]  isosorbide mononitrate (IMDUR) 30 MG 24 hr tablet Take 1 tablet (30 mg total) by mouth daily. 07/13/20 08/12/20  Tolia, Sunit, DO  metoprolol tartrate (LOPRESSOR) 100 MG tablet Take 100 mg by mouth 2 (two) times daily.    [provider]  pantoprazole (PROTONIX) 40 MG tablet Take 1 tablet (40 mg total) by mouth 2 (two) times daily for 20 days. Patient not taking: Reported on 07/13/2020 06/04/20 06/24/20  Darliss Cheney, MD  prazosin (MINIPRESS) 2 MG capsule Take 1 capsule (2 mg  total) by mouth daily after supper. 06/27/20   Adrian Prows, MD    Physical Exam: Vitals:   07/18/20 1630 07/18/20 1743 07/18/20 1800 07/18/20 1815  BP: (!) 125/42 (!) 166/75 (!) 152/76 (!) 160/63  Pulse: 67 82 68 74  Resp: '14 11 12 16  ' Temp:  98.6 F (37 C)    TempSrc:  Oral  Oral  SpO2: 99% 100% 100% 99%    Constitutional: NAD, calm, comfortable Vitals:   07/18/20 1630 07/18/20 1743 07/18/20 1800 07/18/20 1815  BP: (!) 125/42 (!) 166/75 (!) 152/76 (!) 160/63  Pulse: 67 82 68 74  Resp: '14 11 12 16  ' Temp:  98.6 F (37 C)    TempSrc:  Oral  Oral  SpO2: 99% 100% 100% 99%   Eyes: PERRL, lids and conjunctivae normal ENMT: Mucous membranes are moist. Posterior pharynx clear of any exudate or lesions.Normal dentition.  Neck: normal, supple, no masses, no thyromegaly Respiratory: clear to auscultation bilaterally, no wheezing, no crackles. Normal respiratory effort. No accessory muscle use.  Cardiovascular: Regular rate and rhythm, no murmurs / rubs / gallops.  +1 pitting edema left lower extremity, 2+ pedal pulses. No carotid bruits.  Abdomen: no tenderness, no masses palpated. No hepatosplenomegaly. Bowel sounds positive.  Musculoskeletal: no clubbing / cyanosis. No joint deformity upper and lower extremities. Good ROM, no contractures. Normal muscle tone.  Right BKA Skin: no rashes, lesions, ulcers. No induration Neurologic: CN 2-12 grossly intact. Sensation intact, DTR normal. Strength 5/5 in all 4.  Psychiatric: Normal judgment and insight. Alert and oriented x 3. Normal mood.    Labs on Admission: I have personally reviewed following labs and imaging studies  CBC: Recent Labs  Lab 07/18/20 1311  WBC 3.0*  HGB 5.6*  HCT 18.3*  MCV 94.8  PLT 80*   Basic Metabolic Panel: Recent Labs  Lab 07/18/20 1311  NA 135  K 3.8  CL 95*  CO2 29  GLUCOSE 267*  BUN 31*  CREATININE 5.21*  CALCIUM 8.8*   GFR: Estimated Creatinine Clearance: 20.1 mL/min (A) (by C-G formula  based on SCr of 5.21 mg/dL (H)). Liver Function Tests: Recent Labs  Lab 07/18/20 1311  AST 18  ALT 22  ALKPHOS 65  BILITOT 0.8  PROT 5.5*  ALBUMIN 3.2*   No results  for input(s): LIPASE, AMYLASE in the last 168 hours. No results for input(s): AMMONIA in the last 168 hours. Coagulation Profile: No results for input(s): INR, PROTIME in the last 168 hours. Cardiac Enzymes: No results for input(s): CKTOTAL, CKMB, CKMBINDEX, TROPONINI in the last 168 hours. BNP (last 3 results) No results for input(s): PROBNP in the last 8760 hours. HbA1C: No results for input(s): HGBA1C in the last 72 hours. CBG: No results for input(s): GLUCAP in the last 168 hours. Lipid Profile: No results for input(s): CHOL, HDL, LDLCALC, TRIG, CHOLHDL, LDLDIRECT in the last 72 hours. Thyroid Function Tests: No results for input(s): TSH, T4TOTAL, FREET4, T3FREE, THYROIDAB in the last 72 hours. Anemia Panel: No results for input(s): VITAMINB12, FOLATE, FERRITIN, TIBC, IRON, RETICCTPCT in the last 72 hours. Urine analysis:    Component Value Date/Time   COLORURINE YELLOW 05/23/2020 2057   APPEARANCEUR CLEAR 05/23/2020 2057   LABSPEC 1.008 05/23/2020 2057   PHURINE 5.0 05/23/2020 2057   GLUCOSEU 50 (A) 05/23/2020 2057   HGBUR LARGE (A) 05/23/2020 2057   BILIRUBINUR NEGATIVE 05/23/2020 2057   BILIRUBINUR neg 10/01/2014 1445   KETONESUR NEGATIVE 05/23/2020 2057   PROTEINUR 100 (A) 05/23/2020 2057   UROBILINOGEN 0.2 10/01/2014 1445   UROBILINOGEN 0.2 09/30/2014 2051   NITRITE NEGATIVE 05/23/2020 2057   LEUKOCYTESUR NEGATIVE 05/23/2020 2057    Radiological Exams on Admission: No results found.  EKG: Independently reviewed.  Atrial fibrillation  Assessment/Plan Active Problems:   Acute blood loss anemia   Acute blood loss anemia/pancytopenia/presumed upper GI bleed: Hemoglobin 5.8.  FOBT positive in the ED.  Has a history of duodenitis diagnosed on recent EGD.  Colonoscopy was unremarkable.  MCV  within normal range.  He had a history of pancytopenia but his platelets are lower than his baseline.  Also has new leukopenia.  Unsure whether his presentation/pancytopenia is due to Cytoxan or another episode of GI bleed/duodenitis.  ER physician has ordered 2 units of PRBC transfusion.  I will start this patient on Protonix IV twice daily.  ER physician has consulted GI as well.  I will keep him n.p.o. starting midnight for any potential procedure.  Hold Eliquis.  ESRD/acute glomerulonephritis: I have consulted nephrology and discussed with Dr. Marval Regal.  He receives TTS schedule hemodialysis.  Essential hypertension: Blood pressure stable.  Resume all home medications.  Type 2 diabetes mellitus: He takes 70/30 at home.  During his recent hospitalization, he was getting 20 units of Lantus twice daily and was still hyperglycemic.  Since he is going to be n.p.o. overnight, I am going to start him on 20 units twice daily Lantus and start on SSI.    DVT prophylaxis: SCDs Start: 07/18/20 1828 Code Status: Full code Family Communication: None present at bedside.  Plan of care discussed with patient in length and he verbalized understanding and agreed with it. Disposition Plan: Home in 1 to 2 days Consults called: GI, Dr. Hilarie Fredrickson of [facility] Admission status: Observation   Status is: Observation  The patient remains OBS appropriate and will d/c before 2 midnights.  Dispo: The patient is from: Home              Anticipated d/c is to: Home              Anticipated d/c date is: 1 day              Patient currently is not medically stable to d/c.   Darliss Cheney MD [facility]  07/18/2020,  6:32 PM  To contact the attending provider between 7A-7P or the covering provider during after hours 7P-7A, please log into the web site [email or web address]

## 2020-07-18 NOTE — ED Notes (Signed)
Informed Mina - PA of pt's positive occult blood result.

## 2020-07-18 NOTE — ED Triage Notes (Signed)
Pt was called by dialysis nurse and advised to come to ED because his hemoglobin is 5.8. Endorses generalized fatigue. Denies any obvious sources of bleeding. Last dialysis Saturday.

## 2020-07-19 DIAGNOSIS — D62 Acute posthemorrhagic anemia: Secondary | ICD-10-CM

## 2020-07-19 DIAGNOSIS — I132 Hypertensive heart and chronic kidney disease with heart failure and with stage 5 chronic kidney disease, or end stage renal disease: Secondary | ICD-10-CM | POA: Diagnosis present

## 2020-07-19 DIAGNOSIS — T451X5A Adverse effect of antineoplastic and immunosuppressive drugs, initial encounter: Secondary | ICD-10-CM | POA: Diagnosis present

## 2020-07-19 DIAGNOSIS — E785 Hyperlipidemia, unspecified: Secondary | ICD-10-CM | POA: Diagnosis present

## 2020-07-19 DIAGNOSIS — I2510 Atherosclerotic heart disease of native coronary artery without angina pectoris: Secondary | ICD-10-CM | POA: Diagnosis present

## 2020-07-19 DIAGNOSIS — R195 Other fecal abnormalities: Secondary | ICD-10-CM | POA: Diagnosis not present

## 2020-07-19 DIAGNOSIS — E1122 Type 2 diabetes mellitus with diabetic chronic kidney disease: Secondary | ICD-10-CM | POA: Diagnosis present

## 2020-07-19 DIAGNOSIS — D649 Anemia, unspecified: Secondary | ICD-10-CM | POA: Diagnosis not present

## 2020-07-19 DIAGNOSIS — Z20822 Contact with and (suspected) exposure to covid-19: Secondary | ICD-10-CM | POA: Diagnosis present

## 2020-07-19 DIAGNOSIS — Z950 Presence of cardiac pacemaker: Secondary | ICD-10-CM | POA: Diagnosis not present

## 2020-07-19 DIAGNOSIS — E8889 Other specified metabolic disorders: Secondary | ICD-10-CM | POA: Diagnosis present

## 2020-07-19 DIAGNOSIS — Z7901 Long term (current) use of anticoagulants: Secondary | ICD-10-CM | POA: Diagnosis not present

## 2020-07-19 DIAGNOSIS — K922 Gastrointestinal hemorrhage, unspecified: Secondary | ICD-10-CM | POA: Diagnosis not present

## 2020-07-19 DIAGNOSIS — Y929 Unspecified place or not applicable: Secondary | ICD-10-CM | POA: Diagnosis not present

## 2020-07-19 DIAGNOSIS — N179 Acute kidney failure, unspecified: Secondary | ICD-10-CM | POA: Diagnosis present

## 2020-07-19 DIAGNOSIS — I5032 Chronic diastolic (congestive) heart failure: Secondary | ICD-10-CM | POA: Diagnosis present

## 2020-07-19 DIAGNOSIS — D61818 Other pancytopenia: Secondary | ICD-10-CM | POA: Diagnosis not present

## 2020-07-19 DIAGNOSIS — D631 Anemia in chronic kidney disease: Secondary | ICD-10-CM | POA: Diagnosis present

## 2020-07-19 DIAGNOSIS — I480 Paroxysmal atrial fibrillation: Secondary | ICD-10-CM | POA: Diagnosis present

## 2020-07-19 DIAGNOSIS — K2980 Duodenitis without bleeding: Secondary | ICD-10-CM

## 2020-07-19 DIAGNOSIS — Z89511 Acquired absence of right leg below knee: Secondary | ICD-10-CM | POA: Diagnosis not present

## 2020-07-19 DIAGNOSIS — I482 Chronic atrial fibrillation, unspecified: Secondary | ICD-10-CM | POA: Diagnosis present

## 2020-07-19 DIAGNOSIS — D61810 Antineoplastic chemotherapy induced pancytopenia: Secondary | ICD-10-CM | POA: Diagnosis present

## 2020-07-19 DIAGNOSIS — D696 Thrombocytopenia, unspecified: Secondary | ICD-10-CM | POA: Diagnosis not present

## 2020-07-19 DIAGNOSIS — N009 Acute nephritic syndrome with unspecified morphologic changes: Secondary | ICD-10-CM | POA: Diagnosis present

## 2020-07-19 DIAGNOSIS — Z992 Dependence on renal dialysis: Secondary | ICD-10-CM | POA: Diagnosis not present

## 2020-07-19 DIAGNOSIS — N186 End stage renal disease: Secondary | ICD-10-CM | POA: Diagnosis present

## 2020-07-19 DIAGNOSIS — K3189 Other diseases of stomach and duodenum: Secondary | ICD-10-CM | POA: Diagnosis not present

## 2020-07-19 DIAGNOSIS — I452 Bifascicular block: Secondary | ICD-10-CM | POA: Diagnosis present

## 2020-07-19 DIAGNOSIS — I7789 Other specified disorders of arteries and arterioles: Secondary | ICD-10-CM | POA: Diagnosis present

## 2020-07-19 DIAGNOSIS — N2581 Secondary hyperparathyroidism of renal origin: Secondary | ICD-10-CM | POA: Diagnosis present

## 2020-07-19 DIAGNOSIS — E1151 Type 2 diabetes mellitus with diabetic peripheral angiopathy without gangrene: Secondary | ICD-10-CM | POA: Diagnosis present

## 2020-07-19 LAB — CBC
HCT: 21.6 % — ABNORMAL LOW (ref 39.0–52.0)
Hemoglobin: 7 g/dL — ABNORMAL LOW (ref 13.0–17.0)
MCH: 29.8 pg (ref 26.0–34.0)
MCHC: 32.4 g/dL (ref 30.0–36.0)
MCV: 91.9 fL (ref 80.0–100.0)
Platelets: 94 10*3/uL — ABNORMAL LOW (ref 150–400)
RBC: 2.35 MIL/uL — ABNORMAL LOW (ref 4.22–5.81)
RDW: 16.2 % — ABNORMAL HIGH (ref 11.5–15.5)
WBC: 3.6 10*3/uL — ABNORMAL LOW (ref 4.0–10.5)
nRBC: 1.7 % — ABNORMAL HIGH (ref 0.0–0.2)

## 2020-07-19 LAB — MAGNESIUM: Magnesium: 1.9 mg/dL (ref 1.7–2.4)

## 2020-07-19 LAB — TYPE AND SCREEN
ABO/RH(D): A POS
Antibody Screen: NEGATIVE
Unit division: 0
Unit division: 0

## 2020-07-19 LAB — BASIC METABOLIC PANEL
Anion gap: 9 (ref 5–15)
BUN: 35 mg/dL — ABNORMAL HIGH (ref 8–23)
CO2: 30 mmol/L (ref 22–32)
Calcium: 8.8 mg/dL — ABNORMAL LOW (ref 8.9–10.3)
Chloride: 98 mmol/L (ref 98–111)
Creatinine, Ser: 5.66 mg/dL — ABNORMAL HIGH (ref 0.61–1.24)
GFR calc Af Amer: 12 mL/min — ABNORMAL LOW (ref >60)
GFR calc non Af Amer: 10 mL/min — ABNORMAL LOW (ref >60)
Glucose, Bld: 173 mg/dL — ABNORMAL HIGH (ref 70–99)
Potassium: 4.1 mmol/L (ref 3.5–5.1)
Sodium: 137 mmol/L (ref 135–145)

## 2020-07-19 LAB — BPAM RBC
Blood Product Expiration Date: 202110012359
Blood Product Expiration Date: 202110012359
ISSUE DATE / TIME: 202109061728
ISSUE DATE / TIME: 202109062232
Unit Type and Rh: 6200
Unit Type and Rh: 6200

## 2020-07-19 LAB — GLUCOSE, CAPILLARY
Glucose-Capillary: 231 mg/dL — ABNORMAL HIGH (ref 70–99)
Glucose-Capillary: 137 mg/dL — ABNORMAL HIGH (ref 70–99)

## 2020-07-19 LAB — HEPATITIS B SURFACE ANTIGEN: Hepatitis B Surface Ag: NONREACTIVE

## 2020-07-19 LAB — HEPATITIS B SURFACE ANTIBODY,QUALITATIVE: Hep B S Ab: NONREACTIVE

## 2020-07-19 MED ORDER — HEPARIN SODIUM (PORCINE) 1000 UNIT/ML IJ SOLN
INTRAMUSCULAR | Status: AC
  Administered 2020-07-19: 3800 [IU]
  Filled 2020-07-19: qty 4

## 2020-07-19 MED ORDER — CHLORHEXIDINE GLUCONATE CLOTH 2 % EX PADS
6.0000 | MEDICATED_PAD | Freq: Every day | CUTANEOUS | Status: DC
  Administered 2020-07-19: 6 via TOPICAL

## 2020-07-19 MED ORDER — CHLORHEXIDINE GLUCONATE CLOTH 2 % EX PADS: 6.0000 | MEDICATED_PAD | Freq: Every day | CUTANEOUS | Status: DC

## 2020-07-19 MED ORDER — POLYETHYLENE GLYCOL 3350 17 GM/SCOOP PO POWD
1.0000 | Freq: Once | ORAL | Status: DC
  Filled 2020-07-19: qty 255

## 2020-07-19 MED ORDER — BISACODYL 5 MG PO TBEC
10.0000 mg | DELAYED_RELEASE_TABLET | Freq: Once | ORAL | Status: AC
  Administered 2020-07-19: 10 mg via ORAL
  Filled 2020-07-19: qty 2

## 2020-07-19 MED ORDER — POLYETHYLENE GLYCOL 3350 17 GM/SCOOP PO POWD
0.5000 | Freq: Once | ORAL | Status: AC
  Administered 2020-07-19: 127.5 g via ORAL
  Filled 2020-07-19: qty 255

## 2020-07-19 NOTE — Consult Note (Signed)
[facility] Renal Consultation Note    Indication for Consultation:  Management of ESRD/hemodialysis, anemia, hypertension/volume, and secondary hyperparathyroidism.  HPI: Alexander [name] is a 59 y.o. 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 He has been on immunosuppression with cytoxan with plan for 3 month duration (per patient, took for about 1 month) and 6 month prednisone taper. He has been on dialysis at [place] on TTS schedule. Prior to AKI baseline Scr 2.5-3, most recent Scr 5.38 on 07/14/20, appears to have minimal renal recovery to date. Last HD was 07/19/20. During last admission, he also have a GI bleed and underwent EGD/colonoscopy. Found to have duodenitis and started on PPI. Yesterday, outpatient HD unit called to report Hgb 5.8 on 07/14/20 and patient was advised to come to the ED for further work up. Labs notable for Hgb 5.6 with leukopenia and thrombocytopenia, K+ 4.1, Cr 5.66, BUN 35. Patient received 2 units PRBNC and Hgb up to 7.0. this AM. Seen by GI today, appears plan for possible enteroscopy.   Patient reports he has peripheral edema since onset of AKI, which has been improving. Dialyzing through [facility]. Denies any issues with dialysis treatments, no hypotension or cramping. Pt reports feeling "totally fine." Denies CP, palpitations, dizziness, SOB, orthopnea. Reports some L sided abdominal pain on palpation and recent diarrhea, but did not notice any melena or hematochezia. No nausea or vomiting.   Past Medical History:  Diagnosis Date   Alcohol abuse    Anemia    Arthritis    "patient does not think so."   Atrial fibrillation ([place])    Cardiac syncope 10/07/14   rapid A fib with 8 sec pauses on converison with syncope- temp pacing wire placed then PPM   Cataract    BILATERAL   Chronic kidney disease    Coronary artery disease    Diabetes  mellitus    dx---been  awhile   Encounter for care of pacemaker 10/08/2014   History of blood transfusion    Hyperlipemia    Hypertension    Osteomyelitis (HCC)    right foot   Peripheral vascular disease ([place])    Presence of permanent cardiac pacemaker 10/08/2014   Sinus node dysfunction ([place]) 10/08/2014   Past Surgical History:  Procedure Laterality Date   ABDOMINAL AORTOGRAM W/LOWER EXTREMITY N/A 09/09/2017   Procedure: ABDOMINAL AORTOGRAM W/LOWER EXTREMITY;  Surgeon: Angelia Mould, MD;  Location: [place] [facility];  Service: Cardiovascular;  Laterality: N/A;   ABDOMINAL AORTOGRAM W/LOWER EXTREMITY Right 11/11/2019   Procedure: ABDOMINAL AORTOGRAM W/LOWER EXTREMITY;  Surgeon: Marty Heck, MD;  Location: [place] [facility];  Service: Cardiovascular;  Laterality: Right;   AMPUTATION Left 08/19/2013   Procedure: AMPUTATION RAY;  Surgeon: Alta Corning, MD;  Location: [place];  Service: Orthopedics;  Laterality: Left;  ray amputation left 5th   AMPUTATION Left 10/27/2013   Procedure: AMPUTATION DIGIT-LEFT 4TH TOE, 4th and 5th metatarsal.;  Surgeon: Angelia Mould, MD;  Location: [place];  Service: Vascular;  Laterality: Left;   AMPUTATION Left 11/04/2013   Procedure: LEFT FOOT TRANS-METATARSAL AMPUTATION WITH WOUND CLOSURE ;  Surgeon: Alta Corning, MD;  Location: [place];  Service: Orthopedics;  Laterality: Left;   AMPUTATION Right 09/10/2017   Procedure: RIGHT FOURTH AND FIFTH TOE AMPUTATION;  Surgeon: Angelia Mould, MD;  Location: [place];  Service: Vascular;  Laterality:  Right;   AMPUTATION Right 02/19/2020   Procedure: AMPUTATION RIGHT 3RD TOE;  Surgeon: Angelia Mould, MD;  Location: [place];  Service: Vascular;  Laterality: Right;   AMPUTATION Right 03/11/2020   Procedure: RIGHT BELOW KNEE AMPUTATION;  Surgeon: Newt Minion, MD;  Location: [place];  Service: Orthopedics;  Laterality: Right;   APPLICATION OF WOUND VAC Right 02/19/2020    Procedure: Application Of Wound Vac Right foot;  Surgeon: Angelia Mould, MD;  Location: [place];  Service: Vascular;  Laterality: Right;   CARDIAC CATHETERIZATION  10/07/2014   Procedure: LEFT HEART CATH AND CORONARY ANGIOGRAPHY;  Surgeon: Laverda Page, MD;  Location: [facility] CATH LAB;  Service: Cardiovascular;;   COLONOSCOPY W/ POLYPECTOMY     COLONOSCOPY WITH PROPOFOL N/A 05/25/2020   Procedure: COLONOSCOPY WITH PROPOFOL;  Surgeon: Gatha Mayer, MD;  Location: [facility];  Service: Endoscopy;  Laterality: N/A;   EMBOLECTOMY Right 09/12/2017   Procedure: Thrombectomy  & Redo Right Below Knee Popliteal Artery Bypass Graft  ;  Surgeon: Waynetta Sandy, MD;  Location: [place];  Service: Vascular;  Laterality: Right;   ESOPHAGOGASTRODUODENOSCOPY (EGD) WITH PROPOFOL N/A 05/24/2020   Procedure: ESOPHAGOGASTRODUODENOSCOPY (EGD) WITH PROPOFOL;  Surgeon: Ladene Artist, MD;  Location: [facility];  Service: Endoscopy;  Laterality: N/A;   EYE SURGERY Bilateral    cataract   FEMORAL-TIBIAL BYPASS GRAFT Left 10/27/2013   Procedure: BYPASS GRAFT LEFT FEMORAL- POSTERIOR TIBIAL ARTERY;  Surgeon: Angelia Mould, MD;  Location: [place];  Service: Vascular;  Laterality: Left;   FEMORAL-TIBIAL BYPASS GRAFT Right 09/10/2017   Procedure: RIGHT SUPERFICIAL  FEMORAL ARTERY-BELOW KNEE POPLITEAL ARTERY BYPASS GRAFT WITH VEIN;  Surgeon: Angelia Mould, MD;  Location: [place];  Service: Vascular;  Laterality: Right;   I & D EXTREMITY Left 10/27/2013   Procedure: IRRIGATION AND DEBRIDEMENT EXTREMITY- LEFT FOOT;  Surgeon: Angelia Mould, MD;  Location: [place];  Service: Vascular;  Laterality: Left;   I & D EXTREMITY Right 01/26/2020   Procedure: IRRIGATION AND DEBRIDEMENT EXTREMITY RIGHT FOOT WOUND;  Surgeon: Angelia Mould, MD;  Location: [place];  Service: Vascular;  Laterality: Right;   INTRAOPERATIVE ARTERIOGRAM Right 09/10/2017   Procedure: INTRA OPERATIVE  ARTERIOGRAM;  Surgeon: Angelia Mould, MD;  Location: [place];  Service: Vascular;  Laterality: Right;   IR ANGIOGRAM FOLLOW UP STUDY  09/09/2017   IR FLUORO GUIDE CV LINE RIGHT  05/31/2020   IR US GUIDE VASC ACCESS RIGHT  06/01/2020   LEFT HEART CATHETERIZATION WITH CORONARY ANGIOGRAM N/A 11/09/2014   Procedure: LEFT HEART CATHETERIZATION WITH CORONARY ANGIOGRAM;  Surgeon: Laverda Page, MD;  Location: [facility] C[facility] LAB;  Service: Cardiovascular;  Laterality: N/A;   LOWER EXTREMITY ANGIOGRAM Right 11/08/2015   Procedure: Lower Extremity Angiogram;  Surgeon: Serafina Mitchell, MD;  Location: [place] [facility];  Service: Cardiovascular;  Laterality: Right;   LUMBAR LAMINECTOMY  06/13/2012   Procedure: MICRODISCECTOMY LUMBAR LAMINECTOMY;  Surgeon: Jessy Oto, MD;  Location: [place];  Service: Orthopedics;  Laterality: N/A;  Central laminectomy L2-3, L3-4, L4-5   PACEMAKER INSERTION  10/08/14   MDT Advisa MRI compatible dual chamber pacemaker implanted by Dr Caryl Comes for syncope with post-termination pauses   PERCUTANEOUS CORONARY STENT INTERVENTION (PCI-S)  11/09/2014   des to lad & distal circumflex         Dr  [name]   PERIPHERAL VASCULAR BALLOON ANGIOPLASTY Right 11/11/2019   Procedure: PERIPHERAL VASCULAR BALLOON ANGIOPLASTY;  Surgeon: Carlis Abbott,  Gwenyth Allegra, MD;  Location: [place] [facility];  Service: Cardiovascular;  Laterality: Right;  below knee popliteal, tibioperoneal trunk, posterior tibial arteries   PERIPHERAL VASCULAR CATHETERIZATION N/A 11/08/2015   Procedure: Abdominal Aortogram;  Surgeon: Serafina Mitchell, MD;  Location: [place] [facility];  Service: Cardiovascular;  Laterality: N/A;   PERIPHERAL VASCULAR CATHETERIZATION Right 11/08/2015   Procedure: Peripheral Vascular Atherectomy;  Surgeon: Serafina Mitchell, MD;  Location: [place] [facility];  Service: Cardiovascular;  Laterality: Right;   PERIPHERAL VASCULAR CATHETERIZATION N/A 10/10/2016   Procedure: Lower Extremity  Angiography;  Surgeon: Waynetta Sandy, MD;  Location: [place] [facility];  Service: Cardiovascular;  Laterality: N/A;   PERIPHERAL VASCULAR CATHETERIZATION Right 10/10/2016   Procedure: Peripheral Vascular Atherectomy;  Surgeon: Waynetta Sandy, MD;  Location: [place] [facility];  Service: Cardiovascular;  Laterality: Right;  Popliteal   PERMANENT PACEMAKER INSERTION N/A 10/08/2014   Procedure: PERMANENT PACEMAKER INSERTION;  Surgeon: Deboraha Sprang, MD;  Location: [facility];  Service: Cardiovascular;  Laterality: N/A;   TEMPORARY PACEMAKER INSERTION Bilateral 10/07/2014   Procedure: TEMPORARY PACEMAKER INSERTION;  Surgeon: Laverda Page, MD;  Location: [facility] CATH LAB;  Service: Cardiovascular;  Laterality: Bilateral;   WOUND DEBRIDEMENT Right 02/19/2020   Procedure: DEBRIDEMENT WOUND RIGHT FOOT;  Surgeon: Angelia Mould, MD;  Location: [facility] OR;  Service: Vascular;  Laterality: Right;   Family History  Problem Relation Age of Onset   Diabetes type II Mother    Hypertension Mother    Diabetes Mother    Liver cancer Father    Diabetes type II Sister    Breast cancer Sister    Diabetes Sister    Hypertension Sister    Diabetes type II Brother    Kidney failure Brother    Diabetes Brother    Hypertension Brother    Diabetes type II Sister    Social History:  reports that he quit smoking about 5 years ago. His smoking use included cigarettes. He has a 45.00 pack-year smoking history. He has never used smokeless tobacco. He reports current alcohol use of about 6.0 standard drinks of alcohol per week. He reports that he does not use drugs.  ROS: As per HPI otherwise negative.  Physical Exam: Vitals:   07/18/20 2305 07/19/20 0030 07/19/20 0435 07/19/20 0800  BP: 135/62 138/75 118/68 125/72  Pulse: 63 65 65 79  Resp: _0 Temp: 98 F (36.7 C) 98.1 F (36.7 C) 98.6 F (37 C) 99.1 F (37.3 C)  TempSrc: Oral Oral Oral Oral  SpO2: 98%  97% 99% 97%  Weight:         General: Well developed, well nourished, in no acute distress. Head: Normocephalic, atraumatic, sclera non-icteric, mucus membranes are moist. Neck: Supple without lymphadenopathy/masses. JVD not elevated. Lungs: Clear bilaterally to auscultation without wheezes, rales, or rhonchi. Breathing is unlabored. Heart: RRR with normal S1, S2. No murmurs, rubs, or gallops appreciated. Abdomen: Soft, non-tender, non-distended with normoactive bowel sounds. No rebound/guarding. No obvious abdominal masses. Musculoskeletal:  Strength and tone appear normal for age. Lower extremities: 2+ pitting edema bilateral lower extremities Neuro: Alert and oriented X 3. Moves all extremities spontaneously. Psych:  Responds to questions appropriately with a normal affect. Dialysis Access: R IJ TDC  Allergies  Allergen Reactions   Keflex [Cephalexin] Nausea And Vomiting   Pletal [Cilostazol] Palpitations    "Can hear heart beating loudly".   Prior to Admission medications   Medication Sig  Start Date End Date Taking? Authorizing Provider  amLODipine (NORVASC) 5 MG tablet Take 5 mg by mouth daily. 06/09/20  Yes [provider]  apixaban (ELIQUIS) 5 MG TABS tablet Take 1 tablet (5 mg total) by mouth 2 (two) times daily. 07/13/20 10/11/20 Yes Tolia, Sunit, DO  aspirin EC 81 MG tablet Take 81 mg by mouth daily. Swallow whole.   Yes [provider]  atorvastatin (LIPITOR) 20 MG tablet Take 1 tablet by mouth once daily Patient taking differently: Take 20 mg by mouth daily.  12/24/19  Yes Miquel Dunn, NP  hydrALAZINE (APRESOLINE) 100 MG tablet TAKE 1 TABLET BY MOUTH THREE TIMES DAILY Patient taking differently: Take 100 mg by mouth 3 (three) times daily.  02/24/20  Yes Adrian Prows, MD  insulin NPH-regular Human (70-30) 100 UNIT/ML injection Inject 46-56 Units into the skin 2 (two) times daily with a meal. Take 56 units in the morning and Take 46 in the evening   Yes  [provider]  metoprolol tartrate (LOPRESSOR) 100 MG tablet Take 100 mg by mouth 2 (two) times daily.   Yes [provider]  Multiple Vitamin (MULTIVITAMIN ADULT) TABS Take 1 tablet by mouth daily.   Yes [provider]  prazosin (MINIPRESS) 2 MG capsule Take 1 capsule (2 mg total) by mouth daily after supper. 06/27/20  Yes Adrian Prows, MD  ACCU-CHEK FASTCLIX LANCETS MISC Check blood sugar TID & QHS 10/01/14   Lorayne Marek, MD  blood glucose meter kit and supplies KIT Check blood sugar TID & QHS 02/10/15   Advani, Vernon Prey, MD  Blood Glucose Monitoring Suppl (ACCU-CHEK ADVANTAGE DIABETES) kit Use as instructed 10/01/14   Lorayne Marek, MD  cyclophosphamide (CYTOXAN) 50 MG capsule Take 50 mg by mouth daily. Take with food to minimize GI upset. Take early in the day and maintain hydration.    [provider]  glucose blood (ACCU-CHEK AVIVA PLUS) test strip Use as instructed 10/01/14   Advani, Vernon Prey, MD  glucose blood (CHOICE DM FORA G20 TEST STRIPS) test strip Check blood sugar TID & QHS 02/10/15   Advani, Vernon Prey, MD  glucose blood test strip Use as instructed 11/20/13   Reyne Dumas, MD  isosorbide mononitrate (IMDUR) 30 MG 24 hr tablet Take 1 tablet (30 mg total) by mouth daily. 07/13/20 08/12/20  Rex Kras, DO   Current Facility-Administered Medications  Medication Dose Route Frequency Provider Last Rate Last Admin   0.9 %  sodium chloride infusion  10 mL/hr Intravenous Once Fawze, Mina A, PA-C       acetaminophen (TYLENOL) tablet 650 mg  650 mg Oral Q6H PRN Darliss Cheney, MD       Or   acetaminophen (TYLENOL) suppository 650 mg  650 mg Rectal Q6H PRN Darliss Cheney, MD       amLODipine (NORVASC) tablet 5 mg  5 mg Oral Daily Pahwani, Ravi, MD       atorvastatin (LIPITOR) tablet 20 mg  20 mg Oral Daily Pahwani, Einar Grad, MD   20 mg at 07/19/20 0840   Chlorhexidine Gluconate Cloth 2 % PADS 6 each  6 each Topical Daily Pahwani, Einar Grad, MD       hydrALAZINE  (APRESOLINE) injection 10 mg  10 mg Intravenous Q6H PRN Pahwani, Einar Grad, MD       hydrALAZINE (APRESOLINE) tablet 100 mg  100 mg Oral TID Darliss Cheney, MD   100 mg at 07/19/20 0841   insulin aspart (novoLOG) injection 0-15 Units  0-15 Units Subcutaneous TID  WC Darliss Cheney, MD   2 Units at 07/19/20 0842   insulin aspart (novoLOG) injection 0-5 Units  0-5 Units Subcutaneous QHS Darliss Cheney, MD       insulin glargine (LANTUS) injection 20 Units  20 Units Subcutaneous BID Darliss Cheney, MD   20 Units at 07/19/20 0843   isosorbide mononitrate (IMDUR) 24 hr tablet 30 mg  30 mg Oral Daily Darliss Cheney, MD   30 mg at 07/19/20 0839   metoprolol tartrate (LOPRESSOR) tablet 100 mg  100 mg Oral BID Darliss Cheney, MD   100 mg at 07/19/20 0841   ondansetron (ZOFRAN) tablet 4 mg  4 mg Oral Q6H PRN Darliss Cheney, MD       Or   ondansetron (ZOFRAN) injection 4 mg  4 mg Intravenous Q6H PRN Darliss Cheney, MD       polyethylene glycol powder (GLYCOLAX/MIRALAX) container 255 g  1 Container Oral Once Gribbin, Sarah J, PA-C       prazosin (MINIPRESS) capsule 2 mg  2 mg Oral QPC supper Darliss Cheney, MD       sodium chloride flush (NS) 0.9 % injection 3 mL  3 mL Intravenous Q12H Darliss Cheney, MD   3 mL at 07/18/20 2207   Labs: Basic Metabolic Panel: Recent Labs  Lab 07/18/20 1311 07/19/20 0615  NA 135 137  K 3.8 4.1  CL 95* 98  CO2 29 30  GLUCOSE 267* 173*  BUN 31* 35*  CREATININE 5.21* 5.66*  CALCIUM 8.8* 8.8*   Liver Function Tests: Recent Labs  Lab 07/18/20 1311  AST 18  ALT 22  ALKPHOS 65  BILITOT 0.8  PROT 5.5*  ALBUMIN 3.2*   CBC: Recent Labs  Lab 07/18/20 1311 07/19/20 0615  WBC 3.0* 3.6*  HGB 5.6* 7.0*  HCT 18.3* 21.6*  MCV 94.8 91.9  PLT 80* 94*   CBG: Recent Labs  Lab 07/18/20 2112  GLUCAP 145*    Dialysis Orders:  Center: [facility]  on TTS. 180NRe, 3:45hr, BFR 400, DFR 800, EDw 118kg, 2K/2.5Ca, TDC Mircera 200 mcg IV q 2 weeks- last dose  Calcitriol  0.75 mcg PO q HD No heparin  Assessment/Plan: 1.  Acute on chronic anemia: Hgb 5.6, FOBT +. Had GI bleed last admission as well. Seen by GI, apepars plan is for enteroscopy (per pt this will be tomorrow). No heparin with HD 2.  AKI on CKD: Biopsy during admission 05/31/20 consistent with AKI likely secondary to PR3 associated ANCA vasculitis. Dialysis dependent since last admit 05/2020 with minimal renal recovery today. Patient with peripheral edema which he reports is improving. Will continue dialysis on TTS schedule here and follow creatinine. Reports he took cytoxan for about 1 month before running out. Unclear if he was taking prednisone as ordered at discharge. Given pancytopenia, will hold off on restarting this for now and discuss with Dr. Jonnie Finner. Avoid hypotension and nephrotoxic medications including NSAIDs and IV contrast.  3.  Hypertension/volume: 2+ edema on exam, no respiratory symptoms and BP controlled. Continue home meds (metoprolol, amlodipine, hydralazine, imdur) 4.  Anemia: Hgb 7.0, last ESA dose 9/2. See #1 5.  Metabolic bone disease: Calcium at goal. Resume renvela once tolerating PO, follow phos.  6.  Nutrition:  Advance per GI 7. T2DM: Insulin management per primary  Anice Paganini, PA-C 07/19/2020, 9:36 AM  [facility] Pager: [phone]

## 2020-07-19 NOTE — Progress Notes (Deleted)
[facility] Consult: 8:19 AM 07/19/2020  LOS: 0 days    Referring Provider: Dr Broadus John  Primary Care Physician:  Tisovec, [name], MD Primary Gastroenterologist:  Dr.Pyrtle    Reason for Consultation:  Recurrent FOBT + anemia.     HPI: ARREN LAMINACK is a 59 y.o. male.  PMH IDDM.  Obesity. CAD w stenting.  PAD.  A fib.  CHF-D.  Chronic Eliquis.  Heart block.  Cardiac pacemaker. LE revascularization and amputation. ESRD, just recently started HD TTS.  Anemia, transfsued PRBCs in 2018, 01/2020 (#2) and 03/2020 (#3).       09/2014 Colonoscopy.  Screening study.  5, sessile polyps, 4 to 6 mm size. O/w normal study.  Path: TAs.  05/24/2020 EGD for FOBT + dark stools.  Normal to D 3.   05/25/20 Colonoscopy: Normal study to TI, "GI signining off - based upon what I know now I think decreased Hgb likely related to acute kidney injury more than any other possible bleeding - she had darlk stools but did not clearly have melena".  No plans for further studies. Hgb nadir of 8.4, was not transfused with PRBCs.  Returns for admission w pancytopenia, Hgb 5.8; was 9.8 on 7/24. MCV 91.   Platelets 80 K, were in low 100s in 05/2020. WBCs 3.   No c/o melena, BPR, N/V, abd pain, SOB, weakness, presyncope, dizziness, angina.  The only unusual bleeding as she is he has had is he developed a large bruise in his lower right arm within the past few weeks but this is resolving.  There is been no significant bleeding from his dialysis access site in his upper right chest.  In order to save money, the patient has been taking Eliquis just once instead of twice a day.  He is in the "donut hole "and has difficulty affording his medication.  He is taking his low-dose aspirin daily.  Cytoxan (for glomerulonephritis) ran out about 2 weeks ago and  patient asked about this in dialysis as well as made a call to the [facility] where it had been filled but had not gotten any refills and was not sure if he was exposed to keep taking this.  Patient recalls having received Aranesp as well as iron infusions as an outpatient in the past but not sure if he is getting CSA or Feraheme now that he is a dialysis patient.  Lives with his wife.  Drinks one half bottle of wine daily.     Past Medical History:  Diagnosis Date  . Alcohol abuse   . Anemia   . Arthritis    "patient does not think so."  . Atrial fibrillation ([employer])   . Cardiac syncope 10/07/14   rapid A fib with 8 sec pauses on converison with syncope- temp pacing wire placed then PPM  . Cataract    BILATERAL  . Chronic kidney disease   . Coronary artery disease   . Diabetes mellitus    dx---been  awhile  . Encounter for care of pacemaker 10/08/2014  .  History of blood transfusion   . Hyperlipemia   . Hypertension   . Osteomyelitis ([place])    right foot  . Peripheral vascular disease ([place])   . Presence of permanent cardiac pacemaker 10/08/2014  . Sinus node dysfunction ([place]) 10/08/2014    Past Surgical History:  Procedure Laterality Date  . ABDOMINAL AORTOGRAM W/LOWER EXTREMITY N/A 09/09/2017   Procedure: ABDOMINAL AORTOGRAM W/LOWER EXTREMITY;  Surgeon: Angelia Mould, MD;  Location: [place] [facility];  Service: Cardiovascular;  Laterality: N/A;  . ABDOMINAL AORTOGRAM W/LOWER EXTREMITY Right 11/11/2019   Procedure: ABDOMINAL AORTOGRAM W/LOWER EXTREMITY;  Surgeon: Marty Heck, MD;  Location: [place] [facility];  Service: Cardiovascular;  Laterality: Right;  . AMPUTATION Left 08/19/2013   Procedure: AMPUTATION RAY;  Surgeon: Alta Corning, MD;  Location: [place];  Service: Orthopedics;  Laterality: Left;  ray amputation left 5th  . AMPUTATION Left 10/27/2013   Procedure: AMPUTATION DIGIT-LEFT 4TH TOE, 4th and 5th metatarsal.;  Surgeon: Angelia Mould, MD;  Location: [place];  Service: Vascular;  Laterality: Left;  . AMPUTATION Left 11/04/2013   Procedure: LEFT FOOT TRANS-METATARSAL AMPUTATION WITH WOUND CLOSURE ;  Surgeon: Alta Corning, MD;  Location: [place];  Service: Orthopedics;  Laterality: Left;  . AMPUTATION Right 09/10/2017   Procedure: RIGHT FOURTH AND FIFTH TOE AMPUTATION;  Surgeon: Angelia Mould, MD;  Location: [place];  Service: Vascular;  Laterality: Right;  . AMPUTATION Right 02/19/2020   Procedure: AMPUTATION RIGHT 3RD TOE;  Surgeon: Angelia Mould, MD;  Location: [place];  Service: Vascular;  Laterality: Right;  . AMPUTATION Right 03/11/2020   Procedure: RIGHT BELOW KNEE AMPUTATION;  Surgeon: Newt Minion, MD;  Location: [place];  Service: Orthopedics;  Laterality: Right;  . APPLICATION OF WOUND VAC Right 02/19/2020   Procedure: Application Of Wound Vac Right foot;  Surgeon: Angelia Mould, MD;  Location: [place];  Service: Vascular;  Laterality: Right;  . CARDIAC CATHETERIZATION  10/07/2014   Procedure: LEFT HEART CATH AND CORONARY ANGIOGRAPHY;  Surgeon: Laverda Page, MD;  Location: [facility] CATH LAB;  Service: Cardiovascular;;  . COLONOSCOPY W/ POLYPECTOMY    . COLONOSCOPY WITH PROPOFOL N/A 05/25/2020   Procedure: COLONOSCOPY WITH PROPOFOL;  Surgeon: Gatha Mayer, MD;  [facility];  Service: Endoscopy;  Laterality: N/A;  . EMBOLECTOMY Right 09/12/2017   Procedure: Thrombectomy  & Redo Right Below Knee Popliteal Artery Bypass Graft  ;  Surgeon: Waynetta Sandy, MD;  Location: [place];  Service: Vascular;  Laterality: Right;  . ESOPHAGOGASTRODUODENOSCOPY (EGD) WITH PROPOFOL N/A 05/24/2020   Procedure: ESOPHAGOGASTRODUODENOSCOPY (EGD) WITH PROPOFOL;  Surgeon: Ladene Artist, MD;  [facility];  Service: Endoscopy;  Laterality: N/A;  . EYE SURGERY Bilateral    cataract  . FEMORAL-TIBIAL BYPASS GRAFT Left 10/27/2013   Procedure: BYPASS GRAFT LEFT FEMORAL- POSTERIOR TIBIAL  ARTERY;  Surgeon: Angelia Mould, MD;  Location: [place];  Service: Vascular;  Laterality: Left;  . FEMORAL-TIBIAL BYPASS GRAFT Right 09/10/2017   Procedure: RIGHT SUPERFICIAL  FEMORAL ARTERY-BELOW KNEE POPLITEAL ARTERY BYPASS GRAFT WITH VEIN;  Surgeon: Angelia Mould, MD;  Location: [place];  Service: Vascular;  Laterality: Right;  . I & D EXTREMITY Left 10/27/2013   Procedure: IRRIGATION AND DEBRIDEMENT EXTREMITY- LEFT FOOT;  Surgeon: Angelia Mould, MD;  Location: [place];  Service: Vascular;  Laterality: Left;  . I & D EXTREMITY Right 01/26/2020   Procedure: IRRIGATION AND DEBRIDEMENT EXTREMITY RIGHT FOOT WOUND;  Surgeon: Angelia Mould, MD;  Location: [facility] OR;  Service: Vascular;  Laterality: Right;  . INTRAOPERATIVE ARTERIOGRAM Right 09/10/2017   Procedure: INTRA OPERATIVE ARTERIOGRAM;  Surgeon: Angelia Mould, MD;  Location: [place];  Service: Vascular;  Laterality: Right;  . IR ANGIOGRAM FOLLOW UP STUDY  09/09/2017  . IR FLUORO GUIDE CV LINE RIGHT  05/31/2020  . IR US GUIDE VASC ACCESS RIGHT  06/01/2020  . LEFT HEART CATHETERIZATION WITH CORONARY ANGIOGRAM N/A 11/09/2014   Procedure: LEFT HEART CATHETERIZATION WITH CORONARY ANGIOGRAM;  Surgeon: Laverda Page, MD;  Location: [facility] CATH LAB;  Service: Cardiovascular;  Laterality: N/A;  . LOWER EXTREMITY ANGIOGRAM Right 11/08/2015   Procedure: Lower Extremity Angiogram;  Surgeon: Serafina Mitchell, MD;  Location: [place] CV LAB;  Service: Cardiovascular;  Laterality: Right;  . LUMBAR LAMINECTOMY  06/13/2012   Procedure: MICRODISCECTOMY LUMBAR LAMINECTOMY;  Surgeon: Jessy Oto, MD;  Location: [place];  Service: Orthopedics;  Laterality: N/A;  Central laminectomy L2-3, L3-4, L4-5  . PACEMAKER INSERTION  10/08/14   MDT Advisa MRI compatible dual chamber pacemaker implanted by Dr Caryl Comes for syncope with post-termination pauses  . PERCUTANEOUS CORONARY STENT INTERVENTION (PCI-S)  11/09/2014   des to lad & distal  circumflex         Dr  [name]  . PERIPHERAL VASCULAR BALLOON ANGIOPLASTY Right 11/11/2019   Procedure: PERIPHERAL VASCULAR BALLOON ANGIOPLASTY;  Surgeon: Marty Heck, MD;  Location: [place] [facility];  Service: Cardiovascular;  Laterality: Right;  below knee popliteal, tibioperoneal trunk, posterior tibial arteries  . PERIPHERAL VASCULAR CATHETERIZATION N/A 11/08/2015   Procedure: Abdominal Aortogram;  Surgeon: Serafina Mitchell, MD;  Location: [place] [facility];  Service: Cardiovascular;  Laterality: N/A;  . PERIPHERAL VASCULAR CATHETERIZATION Right 11/08/2015   Procedure: Peripheral Vascular Atherectomy;  Surgeon: Serafina Mitchell, MD;  Location: [place] [facility];  Service: Cardiovascular;  Laterality: Right;  . PERIPHERAL VASCULAR CATHETERIZATION N/A 10/10/2016   Procedure: Lower Extremity Angiography;  Surgeon: Waynetta Sandy, MD;  Location: [place] [facility];  Service: Cardiovascular;  Laterality: N/A;  . PERIPHERAL VASCULAR CATHETERIZATION Right 10/10/2016   Procedure: Peripheral Vascular Atherectomy;  Surgeon: Waynetta Sandy, MD;  Location: [place] [facility];  Service: Cardiovascular;  Laterality: Right;  Popliteal  . PERMANENT PACEMAKER INSERTION N/A 10/08/2014   Procedure: PERMANENT PACEMAKER INSERTION;  Surgeon: Deboraha Sprang, MD;  Location: [facility] CATH LAB;  Service: Cardiovascular;  Laterality: N/A;  . TEMPORARY PACEMAKER INSERTION Bilateral 10/07/2014   Procedure: TEMPORARY PACEMAKER INSERTION;  Surgeon: Laverda Page, MD;  Location: [facility] CATH LAB;  Service: Cardiovascular;  Laterality: Bilateral;  . WOUND DEBRIDEMENT Right 02/19/2020   Procedure: DEBRIDEMENT WOUND RIGHT FOOT;  Surgeon: Angelia Mould, MD;  [facility] OR;  Service: Vascular;  Laterality: Right;    Prior to Admission medications   Medication Sig Start Date End Date Taking? Authorizing Provider  amLODipine (NORVASC) 5 MG tablet Take 5 mg by mouth daily. 06/09/20  Yes [provider]  apixaban (ELIQUIS) 5 MG TABS tablet Take 1 tablet (5 mg total) by mouth 2 (two) times daily. 07/13/20 10/11/20 Yes Tolia, Sunit, DO  aspirin EC 81 MG tablet Take 81 mg by mouth daily. Swallow whole.   Yes [provider]  atorvastatin (LIPITOR) 20 MG tablet Take 1 tablet by mouth once daily Patient taking differently: Take 20 mg by mouth daily.  12/24/19  Yes Miquel Dunn, NP  hydrALAZINE (APRESOLINE) 100 MG tablet  TAKE 1 TABLET BY MOUTH THREE TIMES DAILY Patient taking differently: Take 100 mg by mouth 3 (three) times daily.  02/24/20  Yes Adrian Prows, MD  insulin NPH-regular Human (70-30) 100 UNIT/ML injection Inject 46-56 Units into the skin 2 (two) times daily with a meal. Take 56 units in the morning and Take 46 in the evening   Yes [provider]  metoprolol tartrate (LOPRESSOR) 100 MG tablet Take 100 mg by mouth 2 (two) times daily.   Yes [provider]  Multiple Vitamin (MULTIVITAMIN ADULT) TABS Take 1 tablet by mouth daily.   Yes [provider]  prazosin (MINIPRESS) 2 MG capsule Take 1 capsule (2 mg total) by mouth daily after supper. 06/27/20  Yes Adrian Prows, MD  ACCU-CHEK FASTCLIX LANCETS MISC Check blood sugar TID & QHS 10/01/14   Lorayne Marek, MD  blood glucose meter kit and supplies KIT Check blood sugar TID & QHS 02/10/15   Advani, Vernon Prey, MD  Blood Glucose Monitoring Suppl (ACCU-CHEK ADVANTAGE DIABETES) kit Use as instructed 10/01/14   Lorayne Marek, MD  cyclophosphamide (CYTOXAN) 50 MG capsule Take 50 mg by mouth daily. Take with food to minimize GI upset. Take early in the day and maintain hydration.    [provider]  glucose blood (ACCU-CHEK AVIVA PLUS) test strip Use as instructed 10/01/14   Advani, Vernon Prey, MD  glucose blood (CHOICE DM FORA G20 TEST STRIPS) test strip Check blood sugar TID & QHS 02/10/15   Advani, Vernon Prey, MD  glucose blood test strip Use as instructed 11/20/13   Reyne Dumas, MD  isosorbide  mononitrate (IMDUR) 30 MG 24 hr tablet Take 1 tablet (30 mg total) by mouth daily. 07/13/20 08/12/20  Tolia, Sunit, DO    Scheduled Meds: . amLODipine  5 mg Oral Daily  . atorvastatin  20 mg Oral Daily  . Chlorhexidine Gluconate Cloth  6 each Topical Daily  . hydrALAZINE  100 mg Oral TID  . insulin aspart  0-15 Units Subcutaneous TID WC  . insulin aspart  0-5 Units Subcutaneous QHS  . insulin glargine  20 Units Subcutaneous BID  . isosorbide mononitrate  30 mg Oral Daily  . metoprolol tartrate  100 mg Oral BID  . pantoprazole (PROTONIX) IV  40 mg Intravenous Q12H  . prazosin  2 mg Oral QPC supper  . sodium chloride flush  3 mL Intravenous Q12H   Infusions: . sodium chloride     PRN Meds: acetaminophen **OR** acetaminophen, hydrALAZINE, ondansetron **OR** ondansetron (ZOFRAN) IV   Allergies as of 07/18/2020 - Review Complete 07/18/2020  Allergen Reaction Noted  . Keflex [cephalexin] Nausea And Vomiting 12/25/2019  . Pletal [cilostazol] Palpitations 09/18/2017    Family History  Problem Relation Age of Onset  . Diabetes type II Mother   . Hypertension Mother   . Diabetes Mother   . Liver cancer Father   . Diabetes type II Sister   . Breast cancer Sister   . Diabetes Sister   . Hypertension Sister   . Diabetes type II Brother   . Kidney failure Brother   . Diabetes Brother   . Hypertension Brother   . Diabetes type II Sister     Social History   Socioeconomic History  . Marital status: Married    Spouse name: Not on file  . Number of children: 2  . Years of education: Not on file  . Highest education level: Not on file  Occupational History  . Occupation: Disabled  Tobacco Use  .  Smoking status: Former Smoker    Packs/day: 1.50    Years: 30.00    Pack years: 45.00    Types: Cigarettes    Quit date: 10/07/2014    Years since quitting: 5.7  . Smokeless tobacco: Never Used  Vaping Use  . Vaping Use: Never used  Substance and Sexual Activity  . Alcohol use:  Yes    Alcohol/week: 6.0 standard drinks    Types: 6 Cans of beer per week    Comment: 2-3 beers daily  . Drug use: No  . Sexual activity: Not on file  Other Topics Concern  . Not on file  Social History Narrative  . Not on file   Social Determinants of Health   Financial Resource Strain:   . Difficulty of Paying Living Expenses: Not on file  Food Insecurity:   . Worried About Charity fundraiser in the Last Year: Not on file  . Ran Out of Food in the Last Year: Not on file  Transportation Needs:   . Lack of Transportation (Medical): Not on file  . Lack of Transportation (Non-Medical): Not on file  Physical Activity:   . Days of Exercise per Week: Not on file  . Minutes of Exercise per Session: Not on file  Stress:   . Feeling of Stress : Not on file  Social Connections:   . Frequency of Communication with Friends and Family: Not on file  . Frequency of Social Gatherings with Friends and Family: Not on file  . Attends Religious Services: Not on file  . Active Member of Clubs or Organizations: Not on file  . Attends Archivist Meetings: Not on file  . Marital Status: Not on file  Intimate Partner Violence:   . Fear of Current or Ex-Partner: Not on file  . Emotionally Abused: Not on file  . Physically Abused: Not on file  . Sexually Abused: Not on file    REVIEW OF SYSTEMS: Constitutional: No profound weakness or fatigue. ENT:  No nose bleeds Pulm: No breathing difficulties.  No cough. CV:  No palpitations, no LE edema.  No angina GU:  No hematuria, no frequency.  Still makes urine. GI: See HPI Heme: No unusual bleeding or bruising Transfusions: See HPI Neuro:  No headaches, no peripheral tingling or numbness.  No syncope, no seizures. Derm:  No itching, no rash or sores.  Endocrine:  No sweats or chills.  No polyuria or dysuria Immunization: Has been vaccinated for COVID-19.    PHYSICAL EXAM: Vital signs in last 24 hours: Vitals:   07/19/20 0030  07/19/20 0435  BP: 138/75 118/68  Pulse: 65 65  Resp: 16 16  Temp: 98.1 F (36.7 C) 98.6 F (37 C)  SpO2: 97% 99%   Wt Readings from Last 3 Encounters:  07/18/20 118.8 kg  07/13/20 122.5 kg  06/29/20 120.7 kg    General: Pleasant, nonill appearing, obese, comfortable, alert Head: No facial asymmetry or swelling.  No signs of head trauma. Eyes: No scleral icterus, no conjunctival pallor.  EOMI Ears: Not hard of hearing Nose: No congestion or discharge Mouth: Dentition in poor repair.  Tongue midline.  Mucosa moist, pink, clear. Neck: No JVD, no masses, no thyromegaly Lungs: No labored breathing or cough.  Lungs clear with good breath sounds bilaterally. Heart: RRR.  No MRG.  S1, S2 present Abdomen: Obese, soft.  No masses, HSM, bruits, hernias.  Active bowel sounds..   Rectal: Deferred Musc/Skeltl: No joint redness, swelling or gross  deformity.  Left forefoot amputation, no toes remain.  Right BKA. Extremities: Nonpitting swelling in the right lower extremity into the foot. Neurologic: Alert.  Oriented x3.  No tremors, no limb weakness.  No gross deficits Skin: No telangiectasia, no sores, no rashes, no suspicious lesions.  Hint of resolving hematoma in right forearm. Nodes: No cervical adenopathy Psych: Calm, pleasant, cooperative.  Intake/Output from previous day: 09/06 0701 - 09/07 0700 In: 603.5 [P.O.:120; I.V.:100; Blood:383.5] Out: 0  Intake/Output this shift: No intake/output data recorded.  LAB RESULTS: Recent Labs    07/18/20 1311 07/19/20 0615  WBC 3.0* 3.6*  HGB 5.6* 7.0*  HCT 18.3* 21.6*  PLT 80* 94*   BMET Lab Results  Component Value Date   NA 137 07/19/2020   NA 135 07/18/2020   NA 134 (L) 06/04/2020   K 4.1 07/19/2020   K 3.8 07/18/2020   K 4.6 06/04/2020   CL 98 07/19/2020   CL 95 (L) 07/18/2020   CL 102 06/04/2020   CO2 30 07/19/2020   CO2 29 07/18/2020   CO2 20 (L) 06/04/2020   GLUCOSE 173 (H) 07/19/2020   GLUCOSE 267 (H) 07/18/2020     GLUCOSE 377 (H) 06/04/2020   BUN 35 (H) 07/19/2020   BUN 31 (H) 07/18/2020   BUN 94 (H) 06/04/2020   CREATININE 5.66 (H) 07/19/2020   CREATININE 5.21 (H) 07/18/2020   CREATININE 5.85 (H) 06/04/2020   CALCIUM 8.8 (L) 07/19/2020   CALCIUM 8.8 (L) 07/18/2020   CALCIUM 7.8 (L) 06/04/2020   LFT Recent Labs    07/18/20 1311  PROT 5.5*  ALBUMIN 3.2*  AST 18  ALT 22  ALKPHOS 65  BILITOT 0.8   PT/INR Lab Results  Component Value Date   INR 1.2 05/26/2020   INR 1.7 (H) 03/11/2020   INR 1.2 01/26/2020   Hepatitis Panel No results for input(s): HEPBSAG, HCVAB, HEPAIGM, HEPBIGM in the last 72 hours. C-Diff No components found for: CDIFF Lipase  No results found for: LIPASE  Drugs of Abuse     Component Value Date/Time   LABOPIA NONE DETECTED 05/01/2014 0348   COCAINSCRNUR NONE DETECTED 05/01/2014 0348   LABBENZ NONE DETECTED 05/01/2014 0348   AMPHETMU NONE DETECTED 05/01/2014 0348   THCU NONE DETECTED 05/01/2014 0348   LABBARB NONE DETECTED 05/01/2014 0348     RADIOLOGY STUDIES: No results found.    IMPRESSION:   *   FOBT + anemia. In setting of pancytopenia.   Recent unrevealing EGD, colonoscopy in 05/2020. Hgb 5.6 >> 7.  S/p 2 PRBCs.    *    New ESRD.  HD on TTS initiated  *   CAD, PVD.  A fib, LE amputations.  A fib Chronic Eliquis, last dose was 9/6.      PLAN:     *   SB enteroscopy and possible placement of VCE at time of procedure.   Discussed the half dose MiraLAX bowel prep with renal MD, Dr. Jonnie Finner, and this is ok.   Clear dinner, NPO after 10 PM.   Stopped IV Protonix, there was no indication for this at 05/2020 EGD, there are no GERD/PUD sxs, no melena.     Azucena Freed  07/19/2020, 8:19 AM Phone [phone]

## 2020-07-19 NOTE — Progress Notes (Signed)
Hemodialysis initiated via R Chest HD catheter without issue. 3k bath. Uf goal 3L. Patient currently without complaints. Call bell at bedside.

## 2020-07-19 NOTE — Progress Notes (Signed)
[facility] Brief addendum to earlier consult note:  ESA history obtained from outpatient HD clinic. Last mircera dose was 272mcg on 07/14/20 (prior doses 179mcg on 06/30/20 and 60 mcg on 06/18/20). Tsat was 49% on 8/19 with ferritin 572. Patient has not been receiving IV Fe.   Recent hemoglobin trend:  07/14/20   5.8 8.26/21 7.0 06/30/20 7.1 06/23/20 7.3  06/16/20   7.7 06/09/20 9.3  Anice Paganini, PA-C 07/19/2020, 4:18 PM  [facility] Pager: [phone]

## 2020-07-19 NOTE — TOC Initial Note (Signed)
Transition of Care [facility]) - Initial/Assessment Note    Patient Details  Name: Alexander [name] MRN: [id number] Date of Birth: June 21, 1961  [facility]) CM/SW Contact:    Bartholomew Crews, RN Phone Number: [phone] 07/19/2020, 2:24 PM  Clinical Narrative:                  Acknowledging consult for medication assistance. Patient enrolled in a [employer] policy. Noted that patient had stated that he was in the donut hole. Spoke with representative to verify that is patient is in the donut hole that the patient is responsible for 25% of the total cost of medications. Unable to speak to patient this afternoon d/t patient not in room. NCM reviewed scheduled medications, and noted that patient is on Lantus Solostar which is more expensive. Spoke with representative at [employer] who advised that there is a patient assistance program for medicare patients that is income based. Downloaded application to be given to patient.        Patient Goals and CMS Choice        Expected Discharge Plan and Services                                                Prior Living Arrangements/Services                       Activities of Daily Living      Permission Sought/Granted                  Emotional Assessment              Admission diagnosis:  Acute blood loss anemia [D62] Heme positive stool [R19.5] Pancytopenia ([place]) [D61.818] Patient Active Problem List   Diagnosis Date Noted  . Acute blood loss anemia 07/18/2020  . Hemodialysis patient ([place]) 06/04/2020  . Necrotizing glomerulonephritis 06/04/2020  . Occult blood in stools   . Melena   . Upper GI bleeding 05/23/2020  . Acute GI bleeding 05/23/2020  . Acute hematogenous osteomyelitis of right foot ([place]) 03/11/2020  . Gangrene of right foot ([place])   . Diabetic foot ulcer associated with type 2 diabetes mellitus ([place]) 02/19/2020  . Heel ulcer ([employer]) 11/05/2019  . Atherosclerotic PVD  with ulceration ([place]) 09/09/2017  . Essential hypertension, benign 02/07/2015  . Renal insufficiency 02/07/2015  . Post PTCA 11/09/2014  . S/P PTCA (percutaneous transluminal coronary angioplasty) 11/09/2014  . Pacemaker: Basco DR MRI [id number] Dual chamber pacemaker 10/08/2014 10/08/2014  . Encounter for care of pacemaker 10/08/2014  . Sinus node dysfunction ([place]) 10/08/2014  . Cardiac asystole ([place]) 10/07/2014  . Cardiac syncope 10/07/2014  . Diabetes mellitus with stage 3 chronic kidney disease ([place]) 10/07/2014  . CAD (coronary artery disease), native coronary artery 10/07/2014  . Diabetes mellitus due to underlying condition without complications ([place]) 23/53/6144  . Anemia, unspecified 05/25/2014  . Syncope 04/30/2014  . Atherosclerosis of native arteries of the extremities with gangrene ([place]) 12/16/2013  . Volume overload 10/30/2013  . Acute renal failure ([place]) 10/28/2013  . PAD (peripheral artery disease) ([place]) 10/27/2013  . Leukocytosis 10/21/2013  . Anemia 10/21/2013  . Osteomyelitis of left foot ([place]) 08/19/2013  . Spinal stenosis, lumbar region, with neurogenic claudication 06/12/2012    Class: Diagnosis of  . DIABETES MELLITUS, TYPE II 03/15/2009  .  ALCOHOL ABUSE 03/15/2009  . TOBACCO USE 03/15/2009  . HYPERTENSION 03/15/2009   PCP:  [name], MD Pharmacy:   [facility] [address], [place] - [place] [address] Phone: [phone] Fax: [phone]  [place], [place] - [place] [address] Phone: [phone] Fax: [phone]     Social Determinants of Health ([place]) Interventions    Readmission Risk Interventions Readmission Risk Prevention Plan 05/26/2020 03/15/2020  Transportation Screening Complete Complete  PCP or Specialist Appt within 5-7 Days - Complete  Home Care Screening - Complete   Medication Review (RN CM) - Complete  Medication Review ([place]) Complete -  PCP or Specialist appointment within 3-5 days of discharge Complete -  [place] or Home Care Consult Complete -  SW Recovery Care/Counseling Consult Complete -  Palliative Care Screening Not Applicable -  [place] Not Applicable -  Some recent data might be hidden

## 2020-07-19 NOTE — H&P (View-Only) (Signed)
                                                                           [facility] Consult: 8:19 AM 07/19/2020  LOS: 0 days    Referring Provider: Dr Joseph  Primary Care Physician:  Tisovec, Richard W, MD Primary Gastroenterologist:  Dr.Pyrtle    Reason for Consultation:  Recurrent FOBT + anemia.     HPI: Alexander [name] is a 59 y.o. male.  PMH IDDM.  Obesity. CAD w stenting.  PAD.  A fib.  CHF-D.  Chronic Eliquis.  Heart block.  Cardiac pacemaker. LE revascularization and amputation. ESRD, just recently started HD TTS.  Anemia, transfsued PRBCs in 2018, 01/2020 (#2) and 03/2020 (#3).       09/2014 Colonoscopy.  Screening study.  5, sessile polyps, 4 to 6 mm size. O/w normal study.  Path: TAs.  05/24/2020 EGD for FOBT + dark stools.  Normal to D 3.   05/25/20 Colonoscopy: Normal study to TI, "GI signining off - based upon what I know now I think decreased Hgb likely related to acute kidney injury more than any other possible bleeding - she had darlk stools but did not clearly have melena".  No plans for further studies. Hgb nadir of 8.4, was not transfused with PRBCs.  Returns for admission w pancytopenia, Hgb 5.8; was 9.8 on 7/24. MCV 91.   Platelets 80 K, were in low 100s in 05/2020. WBCs 3.   No c/o melena, BPR, N/V, abd pain, SOB, weakness, presyncope, dizziness, angina.  The only unusual bleeding as she is he has had is he developed a large bruise in his lower right arm within the past few weeks but this is resolving.  There is been no significant bleeding from his dialysis access site in his upper right chest.  In order to save money, the patient has been taking Eliquis just once instead of twice a day.  He is in the "donut hole "and has difficulty affording his medication.  He is taking his low-dose aspirin daily.  Cytoxan (for glomerulonephritis) ran out about 2 weeks ago and  patient asked about this in dialysis as well as made a call to the [place] pharmacy where it had been filled but had not gotten any refills and was not sure if he was exposed to keep taking this.  Patient recalls having received Aranesp as well as iron infusions as an outpatient in the past but not sure if he is getting CSA or Feraheme now that he is a dialysis patient.  Lives with his wife.  Drinks one half bottle of wine daily.     Past Medical History:  Diagnosis Date  . Alcohol abuse   . Anemia   . Arthritis    "patient does not think so."  . Atrial fibrillation (HCC)   . Cardiac syncope 10/07/14   rapid A fib with 8 sec pauses on converison with syncope- temp pacing wire placed then PPM  . Cataract    BILATERAL  . Chronic kidney disease   . Coronary artery disease   . Diabetes mellitus    dx---been  awhile  . Encounter for care of pacemaker 10/08/2014  .   History of blood transfusion   . Hyperlipemia   . Hypertension   . Osteomyelitis (HCC)    right foot  . Peripheral vascular disease (HCC)   . Presence of permanent cardiac pacemaker 10/08/2014  . Sinus node dysfunction (HCC) 10/08/2014    Past Surgical History:  Procedure Laterality Date  . ABDOMINAL AORTOGRAM W/LOWER EXTREMITY N/A 09/09/2017   Procedure: ABDOMINAL AORTOGRAM W/LOWER EXTREMITY;  Surgeon: Dickson, [name], MD;  [facility];  Service: Cardiovascular;  Laterality: N/A;  . ABDOMINAL AORTOGRAM W/LOWER EXTREMITY Right 11/11/2019   Procedure: ABDOMINAL AORTOGRAM W/LOWER EXTREMITY;  Surgeon: Clark, Christopher J, MD;  [facility]: [facility];  Service: Cardiovascular;  Laterality: Right;  . AMPUTATION Left 08/19/2013   Procedure: AMPUTATION RAY;  Surgeon: [name] Graves, MD;  Location: [facility];  Service: Orthopedics;  Laterality: Left;  ray amputation left 5th  . AMPUTATION Left 10/27/2013   Procedure: AMPUTATION DIGIT-LEFT 4TH TOE, 4th and 5th metatarsal.;  Surgeon: [name]  Dickson, MD;  [facility]: [facility];  Service: Vascular;  Laterality: Left;  . AMPUTATION Left 11/04/2013   Procedure: LEFT FOOT TRANS-METATARSAL AMPUTATION WITH WOUND CLOSURE ;  Surgeon: [name] Graves, MD;  [facility]: [facility];  Service: Orthopedics;  Laterality: Left;  . AMPUTATION Right 09/10/2017   Procedure: RIGHT FOURTH AND FIFTH TOE AMPUTATION;  Surgeon: Dickson, [name], MD;  [facility];  Service: Vascular;  Laterality: Right;  . AMPUTATION Right 02/19/2020   Procedure: AMPUTATION RIGHT 3RD TOE;  Surgeon: Dickson, [name], MD;  [facility];  Service: Vascular;  Laterality: Right;  . AMPUTATION Right 03/11/2020   Procedure: RIGHT BELOW KNEE AMPUTATION;  Surgeon: Duda, Marcus V, MD;  [facility]: [facility];  Service: Orthopedics;  Laterality: Right;  . APPLICATION OF WOUND VAC Right 02/19/2020   Procedure: Application Of Wound Vac Right foot;  Surgeon: Dickson, [name], MD;  [facility];  Service: Vascular;  Laterality: Right;  . CARDIAC CATHETERIZATION  10/07/2014   Procedure: LEFT HEART CATH AND CORONARY ANGIOGRAPHY;  Surgeon: Jagadeesh R Ganji, MD;  [facility];  Service: Cardiovascular;;  . COLONOSCOPY W/ POLYPECTOMY    . COLONOSCOPY WITH PROPOFOL N/A 05/25/2020   Procedure: COLONOSCOPY WITH PROPOFOL;  Surgeon: Gessner, Carl E, MD;  Location: [facility];  Service: Endoscopy;  Laterality: N/A;  . EMBOLECTOMY Right 09/12/2017   Procedure: Thrombectomy  & Redo Right Below Knee Popliteal Artery Bypass Graft  ;  Surgeon: Cain, Brandon Christopher, MD;  [facility];  Service: Vascular;  Laterality: Right;  . ESOPHAGOGASTRODUODENOSCOPY (EGD) WITH PROPOFOL N/A 05/24/2020   Procedure: ESOPHAGOGASTRODUODENOSCOPY (EGD) WITH PROPOFOL;  Surgeon: Stark, Malcolm T, MD;  Location: [facility];  Service: Endoscopy;  Laterality: N/A;  . EYE SURGERY Bilateral    cataract  . FEMORAL-TIBIAL BYPASS GRAFT Left 10/27/2013   Procedure: BYPASS GRAFT LEFT FEMORAL- POSTERIOR TIBIAL  ARTERY;  Surgeon: [name] Dickson, MD;  [facility];  Service: Vascular;  Laterality: Left;  . FEMORAL-TIBIAL BYPASS GRAFT Right 09/10/2017   Procedure: RIGHT SUPERFICIAL  FEMORAL ARTERY-BELOW KNEE POPLITEAL ARTERY BYPASS GRAFT WITH VEIN;  Surgeon: Dickson, [name], MD;  [facility];  Service: Vascular;  Laterality: Right;  . I & D EXTREMITY Left 10/27/2013   Procedure: IRRIGATION AND DEBRIDEMENT EXTREMITY- LEFT FOOT;  Surgeon: [name] Dickson, MD;  [facility];  Service: Vascular;  Laterality: Left;  . I & D EXTREMITY Right 01/26/2020   Procedure: IRRIGATION AND DEBRIDEMENT EXTREMITY RIGHT FOOT WOUND;    Surgeon: Dickson, [name], MD;  [facility];  Service: Vascular;  Laterality: Right;  . INTRAOPERATIVE ARTERIOGRAM Right 09/10/2017   Procedure: INTRA OPERATIVE ARTERIOGRAM;  Surgeon: Dickson, [name], MD;  [facility];  Service: Vascular;  Laterality: Right;  . IR ANGIOGRAM FOLLOW UP STUDY  09/09/2017  . IR FLUORO GUIDE CV LINE RIGHT  05/31/2020  . IR US GUIDE VASC ACCESS RIGHT  06/01/2020  . LEFT HEART CATHETERIZATION WITH CORONARY ANGIOGRAM N/A 11/09/2014   Procedure: LEFT HEART CATHETERIZATION WITH CORONARY ANGIOGRAM;  Surgeon: Jagadeesh R Ganji, MD;  [facility];  Service: Cardiovascular;  Laterality: N/A;  . LOWER EXTREMITY ANGIOGRAM Right 11/08/2015   Procedure: Lower Extremity Angiogram;  Surgeon: Vance W Brabham, MD;  [facility];  Service: Cardiovascular;  Laterality: Right;  . LUMBAR LAMINECTOMY  06/13/2012   Procedure: MICRODISCECTOMY LUMBAR LAMINECTOMY;  Surgeon: James E Nitka, MD;  [facility];  Service: Orthopedics;  Laterality: N/A;  Central laminectomy L2-3, L3-4, L4-5  . PACEMAKER INSERTION  10/08/14   MDT Advisa MRI compatible dual chamber pacemaker implanted by Dr Klein for syncope with post-termination pauses  . PERCUTANEOUS CORONARY STENT INTERVENTION (PCI-S)  11/09/2014   des to lad & distal  circumflex         Dr  Ganji  . PERIPHERAL VASCULAR BALLOON ANGIOPLASTY Right 11/11/2019   Procedure: PERIPHERAL VASCULAR BALLOON ANGIOPLASTY;  Surgeon: Clark, Christopher J, MD;  [facility];  Service: Cardiovascular;  Laterality: Right;  below knee popliteal, tibioperoneal trunk, posterior tibial arteries  . PERIPHERAL VASCULAR CATHETERIZATION N/A 11/08/2015   Procedure: Abdominal Aortogram;  Surgeon: Vance W Brabham, MD;  [facility];  Service: Cardiovascular;  Laterality: N/A;  . PERIPHERAL VASCULAR CATHETERIZATION Right 11/08/2015   Procedure: Peripheral Vascular Atherectomy;  Surgeon: Vance W Brabham, MD;  [facility];  Service: Cardiovascular;  Laterality: Right;  . PERIPHERAL VASCULAR CATHETERIZATION N/A 10/10/2016   Procedure: Lower Extremity Angiography;  Surgeon: Brandon Christopher Cain, MD;  [facility];  Service: Cardiovascular;  Laterality: N/A;  . PERIPHERAL VASCULAR CATHETERIZATION Right 10/10/2016   Procedure: Peripheral Vascular Atherectomy;  Surgeon: Brandon Christopher Cain, MD;  [facility];  Service: Cardiovascular;  Laterality: Right;  Popliteal  . PERMANENT PACEMAKER INSERTION N/A 10/08/2014   Procedure: PERMANENT PACEMAKER INSERTION;  Surgeon: Steven C Klein, MD;  [facility];  Service: Cardiovascular;  Laterality: N/A;  . TEMPORARY PACEMAKER INSERTION Bilateral 10/07/2014   Procedure: TEMPORARY PACEMAKER INSERTION;  Surgeon: Jagadeesh R Ganji, MD;  [facility];  Service: Cardiovascular;  Laterality: Bilateral;  . WOUND DEBRIDEMENT Right 02/19/2020   Procedure: DEBRIDEMENT WOUND RIGHT FOOT;  Surgeon: Dickson, [name], MD;  [facility];  Service: Vascular;  Laterality: Right;    Prior to Admission medications   Medication Sig Start Date End Date Taking? Authorizing Provider  amLODipine (NORVASC) 5 MG tablet Take 5 mg by mouth daily. 06/09/20  Yes [provider]  apixaban (ELIQUIS) 5 MG TABS tablet Take 1 tablet (5 mg total) by mouth 2 (two) times daily. 07/13/20 10/11/20 Yes Tolia, Sunit, DO  aspirin EC 81 MG tablet Take 81 mg by mouth daily. Swallow whole.   Yes [provider]  atorvastatin (LIPITOR) 20 MG tablet Take 1 tablet by mouth once daily Patient taking differently: Take 20 mg by mouth daily.  12/24/19  Yes Kelley, Ashton Haynes, NP  hydrALAZINE (APRESOLINE) 100 MG tablet   TAKE 1 TABLET BY MOUTH THREE TIMES DAILY Patient taking differently: Take 100 mg by mouth 3 (three) times daily.  02/24/20  Yes Ganji, Jay, MD  insulin NPH-regular Human (70-30) 100 UNIT/ML injection Inject 46-56 Units into the skin 2 (two) times daily with a meal. Take 56 units in the morning and Take 46 in the evening   Yes [provider]  metoprolol tartrate (LOPRESSOR) 100 MG tablet Take 100 mg by mouth 2 (two) times daily.   Yes [provider]  Multiple Vitamin (MULTIVITAMIN ADULT) TABS Take 1 tablet by mouth daily.   Yes [provider]  prazosin (MINIPRESS) 2 MG capsule Take 1 capsule (2 mg total) by mouth daily after supper. 06/27/20  Yes Ganji, Jay, MD  ACCU-CHEK FASTCLIX LANCETS MISC Check blood sugar TID & QHS 10/01/14   Advani, Deepak, MD  blood glucose meter kit and supplies KIT Check blood sugar TID & QHS 02/10/15   Advani, Deepak, MD  Blood Glucose Monitoring Suppl (ACCU-CHEK ADVANTAGE DIABETES) kit Use as instructed 10/01/14   Advani, Deepak, MD  cyclophosphamide (CYTOXAN) 50 MG capsule Take 50 mg by mouth daily. Take with food to minimize GI upset. Take early in the day and maintain hydration.    [provider]  glucose blood (ACCU-CHEK AVIVA PLUS) test strip Use as instructed 10/01/14   Advani, Deepak, MD  glucose blood (CHOICE DM FORA G20 TEST STRIPS) test strip Check blood sugar TID & QHS 02/10/15   Advani, Deepak, MD  glucose blood test strip Use as instructed 11/20/13   Abrol, Nayana, MD  isosorbide  mononitrate (IMDUR) 30 MG 24 hr tablet Take 1 tablet (30 mg total) by mouth daily. 07/13/20 08/12/20  Tolia, Sunit, DO    Scheduled Meds: . amLODipine  5 mg Oral Daily  . atorvastatin  20 mg Oral Daily  . Chlorhexidine Gluconate Cloth  6 each Topical Daily  . hydrALAZINE  100 mg Oral TID  . insulin aspart  0-15 Units Subcutaneous TID WC  . insulin aspart  0-5 Units Subcutaneous QHS  . insulin glargine  20 Units Subcutaneous BID  . isosorbide mononitrate  30 mg Oral Daily  . metoprolol tartrate  100 mg Oral BID  . pantoprazole (PROTONIX) IV  40 mg Intravenous Q12H  . prazosin  2 mg Oral QPC supper  . sodium chloride flush  3 mL Intravenous Q12H   Infusions: . sodium chloride     PRN Meds: acetaminophen **OR** acetaminophen, hydrALAZINE, ondansetron **OR** ondansetron (ZOFRAN) IV   Allergies as of 07/18/2020 - Review Complete 07/18/2020  Allergen Reaction Noted  . Keflex [cephalexin] Nausea And Vomiting 12/25/2019  . Pletal [cilostazol] Palpitations 09/18/2017    Family History  Problem Relation Age of Onset  . Diabetes type II Mother   . Hypertension Mother   . Diabetes Mother   . Liver cancer Father   . Diabetes type II Sister   . Breast cancer Sister   . Diabetes Sister   . Hypertension Sister   . Diabetes type II Brother   . Kidney failure Brother   . Diabetes Brother   . Hypertension Brother   . Diabetes type II Sister     Social History   Socioeconomic History  . Marital status: Married    Spouse name: Not on file  . Number of children: 2  . Years of education: Not on file  . Highest education level: Not on file  Occupational History  . Occupation: Disabled  Tobacco Use  .   Smoking status: Former Smoker    Packs/day: 1.50    Years: 30.00    Pack years: 45.00    Types: Cigarettes    Quit date: 10/07/2014    Years since quitting: 5.7  . Smokeless tobacco: Never Used  Vaping Use  . Vaping Use: Never used  Substance and Sexual Activity  . Alcohol use:  Yes    Alcohol/week: 6.0 standard drinks    Types: 6 Cans of beer per week    Comment: 2-3 beers daily  . Drug use: No  . Sexual activity: Not on file  Other Topics Concern  . Not on file  Social History Narrative  . Not on file   Social Determinants of Health   Financial Resource Strain:   . Difficulty of Paying Living Expenses: Not on file  Food Insecurity:   . Worried About Running Out of Food in the Last Year: Not on file  . Ran Out of Food in the Last Year: Not on file  Transportation Needs:   . Lack of Transportation (Medical): Not on file  . Lack of Transportation (Non-Medical): Not on file  Physical Activity:   . Days of Exercise per Week: Not on file  . Minutes of Exercise per Session: Not on file  Stress:   . Feeling of Stress : Not on file  Social Connections:   . Frequency of Communication with Friends and Family: Not on file  . Frequency of Social Gatherings with Friends and Family: Not on file  . Attends Religious Services: Not on file  . Active Member of Clubs or Organizations: Not on file  . Attends Club or Organization Meetings: Not on file  . Marital Status: Not on file  Intimate Partner Violence:   . Fear of Current or Ex-Partner: Not on file  . Emotionally Abused: Not on file  . Physically Abused: Not on file  . Sexually Abused: Not on file    REVIEW OF SYSTEMS: Constitutional: No profound weakness or fatigue. ENT:  No nose bleeds Pulm: No breathing difficulties.  No cough. CV:  No palpitations, no LE edema.  No angina GU:  No hematuria, no frequency.  Still makes urine. GI: See HPI Heme: No unusual bleeding or bruising Transfusions: See HPI Neuro:  No headaches, no peripheral tingling or numbness.  No syncope, no seizures. Derm:  No itching, no rash or sores.  Endocrine:  No sweats or chills.  No polyuria or dysuria Immunization: Has been vaccinated for COVID-19.    PHYSICAL EXAM: Vital signs in last 24 hours: Vitals:   07/19/20 0030  07/19/20 0435  BP: 138/75 118/68  Pulse: 65 65  Resp: 16 16  Temp: 98.1 F (36.7 C) 98.6 F (37 C)  SpO2: 97% 99%   Wt Readings from Last 3 Encounters:  07/18/20 118.8 kg  07/13/20 122.5 kg  06/29/20 120.7 kg    General: Pleasant, nonill appearing, obese, comfortable, alert Head: No facial asymmetry or swelling.  No signs of head trauma. Eyes: No scleral icterus, no conjunctival pallor.  EOMI Ears: Not hard of hearing Nose: No congestion or discharge Mouth: Dentition in poor repair.  Tongue midline.  Mucosa moist, pink, clear. Neck: No JVD, no masses, no thyromegaly Lungs: No labored breathing or cough.  Lungs clear with good breath sounds bilaterally. Heart: RRR.  No MRG.  S1, S2 present Abdomen: Obese, soft.  No masses, HSM, bruits, hernias.  Active bowel sounds..   Rectal: Deferred Musc/Skeltl: No joint redness, swelling or gross   deformity.  Left forefoot amputation, no toes remain.  Right BKA. Extremities: Nonpitting swelling in the right lower extremity into the foot. Neurologic: Alert.  Oriented x3.  No tremors, no limb weakness.  No gross deficits Skin: No telangiectasia, no sores, no rashes, no suspicious lesions.  Hint of resolving hematoma in right forearm. Nodes: No cervical adenopathy Psych: Calm, pleasant, cooperative.  Intake/Output from previous day: 09/06 0701 - 09/07 0700 In: 603.5 [P.O.:120; I.V.:100; Blood:383.5] Out: 0  Intake/Output this shift: No intake/output data recorded.  LAB RESULTS: Recent Labs    07/18/20 1311 07/19/20 0615  WBC 3.0* 3.6*  HGB 5.6* 7.0*  HCT 18.3* 21.6*  PLT 80* 94*   BMET Lab Results  Component Value Date   NA 137 07/19/2020   NA 135 07/18/2020   NA 134 (L) 06/04/2020   K 4.1 07/19/2020   K 3.8 07/18/2020   K 4.6 06/04/2020   CL 98 07/19/2020   CL 95 (L) 07/18/2020   CL 102 06/04/2020   CO2 30 07/19/2020   CO2 29 07/18/2020   CO2 20 (L) 06/04/2020   GLUCOSE 173 (H) 07/19/2020   GLUCOSE 267 (H) 07/18/2020     GLUCOSE 377 (H) 06/04/2020   BUN 35 (H) 07/19/2020   BUN 31 (H) 07/18/2020   BUN 94 (H) 06/04/2020   CREATININE 5.66 (H) 07/19/2020   CREATININE 5.21 (H) 07/18/2020   CREATININE 5.85 (H) 06/04/2020   CALCIUM 8.8 (L) 07/19/2020   CALCIUM 8.8 (L) 07/18/2020   CALCIUM 7.8 (L) 06/04/2020   LFT Recent Labs    07/18/20 1311  PROT 5.5*  ALBUMIN 3.2*  AST 18  ALT 22  ALKPHOS 65  BILITOT 0.8   PT/INR Lab Results  Component Value Date   INR 1.2 05/26/2020   INR 1.7 (H) 03/11/2020   INR 1.2 01/26/2020   Hepatitis Panel No results for input(s): HEPBSAG, HCVAB, HEPAIGM, HEPBIGM in the last 72 hours. C-Diff No components found for: CDIFF Lipase  No results found for: LIPASE  Drugs of Abuse     Component Value Date/Time   LABOPIA NONE DETECTED 05/01/2014 0348   COCAINSCRNUR NONE DETECTED 05/01/2014 0348   LABBENZ NONE DETECTED 05/01/2014 0348   AMPHETMU NONE DETECTED 05/01/2014 0348   THCU NONE DETECTED 05/01/2014 0348   LABBARB NONE DETECTED 05/01/2014 0348     RADIOLOGY STUDIES: No results found.    IMPRESSION:   *   FOBT + anemia. In setting of pancytopenia.   Recent unrevealing EGD, colonoscopy in 05/2020. Hgb 5.6 >> 7.  S/p 2 PRBCs.    *    New ESRD.  HD on TTS initiated  *   CAD, PVD.  A fib, LE amputations.  A fib Chronic Eliquis, last dose was 9/6.      PLAN:     *   SB enteroscopy and possible placement of VCE at time of procedure.   Discussed the half dose MiraLAX bowel prep with renal MD, Dr. Schertz, and this is ok.   Clear dinner, NPO after 10 PM.   Stopped IV Protonix, there was no indication for this at 05/2020 EGD, there are no GERD/PUD sxs, no melena.     Darreon Lutes  07/19/2020, 8:19 AM Phone [phone]     

## 2020-07-19 NOTE — Anesthesia Preprocedure Evaluation (Addendum)
Anesthesia Evaluation  Patient identified by MRN, date of birth, ID band Patient awake    Reviewed: Allergy & Precautions, NPO status , Patient's Chart, lab work & pertinent test results  Airway Mallampati: II  TM Distance: >3 FB Neck ROM: Full    Dental no notable dental hx. (+) Edentulous Upper, Poor Dentition,    Pulmonary neg pulmonary ROS, former smoker,    Pulmonary exam normal breath sounds clear to auscultation       Cardiovascular hypertension, Pt. on medications and Pt. on home beta blockers + CAD and + Peripheral Vascular Disease  Normal cardiovascular exam+ pacemaker  Rhythm:Regular Rate:Normal     Neuro/Psych PSYCHIATRIC DISORDERS negative neurological ROS     GI/Hepatic negative GI ROS, Neg liver ROS,   Endo/Other  diabetes, Type 1, Insulin Dependent  Renal/GU Dialysis and ARFRenal diseaseK+ 4.1 Cr 5.66  Tu TH Sat dialysis     Musculoskeletal  (+) Arthritis ,   Abdominal (+) + obese,   Peds  Hematology  (+) anemia , Hgb 7.0   Anesthesia Other Findings   Reproductive/Obstetrics negative OB ROS                            Anesthesia Physical Anesthesia Plan  ASA: IV  Anesthesia Plan: MAC   Post-op Pain Management:    Induction: Intravenous  PONV Risk Score and Plan: Treatment may vary due to age or medical condition  Airway Management Planned: Natural Airway and Nasal Cannula  Additional Equipment: None  Intra-op Plan:   Post-operative Plan:   Informed Consent: I have reviewed the patients History and Physical, chart, labs and discussed the procedure including the risks, benefits and alternatives for the proposed anesthesia with the patient or authorized representative who has indicated his/her understanding and acceptance.     Dental advisory given  Plan Discussed with: CRNA and Anesthesiologist  Anesthesia Plan Comments:        Anesthesia Quick  Evaluation

## 2020-07-19 NOTE — Progress Notes (Signed)
PROGRESS NOTE    Alexander [name]  SNK:[id number] DOB: [birth year] DOA: 07/18/2020 PCP: [name], MD  Brief Narrative: Alexander [name] is a 59 year old male with history of ESRD, AKI due to necrotizing glomerulonephritis/ANCA vasculitis started on hemodialysis in July, chronic Cytoxan therapy started 2 months prior, also chronic anemia recent extensive GI work-up, peripheral vascular disease, right BKA, chronic diastolic CHF, paroxysmal atrial fibrillation on Eliquis, CAD, type 2 diabetes mellitus was sent to the emergency room due to severe anemia noted on labs, hemoglobin of 5.8.  Patient denied any overt hematemesis or melena, he took Cytoxan for about a month after his discharge in July and after that ran out of this medicine.  -He was heme positive in the emergency room, labs also noted pancytopenia -Transfused 2 units of PRBC overnight   Assessment & Plan:   Acute on chronic anemia Symptomatic anemia Pancytopenia -Suspect this is multifactorial, heme positive stools in the ED last night,, history of duodenitis on recent EGD, colonoscopy in July was unremarkable -Gastroenterology following, plan for enteroscopy/capsule study -Also component of anemia of chronic disease from CKD and bone marrow suppression from Cytoxan -Transfused 2 units of PRBC overnight, hemoglobin up to 7.4 this morning -Eliquis on hold -Check anemia panel  ESRD/necrotizing glomerulonephritis/ANCA vasculitis -Started on hemodialysis in July -Nephrology consulted, due for HD today -Stop taking Cytoxan a month ago after he ran out of his prescription  Hypertension -Stable, continue metoprolol, amlodipine, hydralazine, prazosin  Type 2 diabetes mellitus -CBGs are stable, continue Lantus and sliding scale insulin  Peripheral vascular disease -Status post right BKA  DVT prophylaxis: SCDs Code Status: Full code Family Communication: Discussed with patient in detail, no family at bedside Disposition Plan:    Status is: Observation  The patient will require care spanning > 2 midnights and should be moved to inpatient because: Ongoing diagnostic testing needed not appropriate for outpatient work up  Dispo: The patient is from: Home              Anticipated d/c is to: Home              Anticipated d/c date is: 1-2              Patient currently is not medically stable to d/c.  Consultants:   Gastroenterology, renal   Procedures:   Antimicrobials:    Subjective: -Feels better today reports feeling more tired than usual in the past 1 to 2 weeks  Objective: Vitals:   07/18/20 2305 07/19/20 0030 07/19/20 0435 07/19/20 0800  BP: 135/62 138/75 118/68 125/72  Pulse: 63 65 65 79  Resp: 16 16 16 16   Temp: 98 F (36.7 C) 98.1 F (36.7 C) 98.6 F (37 C) 99.1 F (37.3 C)  TempSrc: Oral Oral Oral Oral  SpO2: 98% 97% 99% 97%  Weight:        Intake/Output Summary (Last 24 hours) at 07/19/2020 1121 Last data filed at 07/19/2020 9379 Gross per 24 hour  Intake 903.5 ml  Output 500 ml  Net 403.5 ml   Filed Weights   07/18/20 2031  Weight: 118.8 kg    Examination:  General exam: Pleasant middle-aged male sitting up in bed, AAOx3, no distress HEENT: No JVD CVS: S1-S2, regular rate rhythm Lungs: Clear bilaterally Abdomen: Soft, nontender, bowel sounds present Extremities: Right BKA Skin: No rashes on exposed skin Psychiatry: Judgement and insight appear normal. Mood & affect appropriate.     Data Reviewed:   CBC: Recent Labs  Lab  07/18/20 1311 07/19/20 0615  WBC 3.0* 3.6*  HGB 5.6* 7.0*  HCT 18.3* 21.6*  MCV 94.8 91.9  PLT 80* 94*   Basic Metabolic Panel: Recent Labs  Lab 07/18/20 1311 07/19/20 0615  NA 135 137  K 3.8 4.1  CL 95* 98  CO2 29 30  GLUCOSE 267* 173*  BUN 31* 35*  CREATININE 5.21* 5.66*  CALCIUM 8.8* 8.8*  MG  --  1.9   GFR: Estimated Creatinine Clearance: 18.2 mL/min (A) (by C-G formula based on SCr of 5.66 mg/dL (H)). Liver Function  Tests: Recent Labs  Lab 07/18/20 1311  AST 18  ALT 22  ALKPHOS 65  BILITOT 0.8  PROT 5.5*  ALBUMIN 3.2*   No results for input(s): LIPASE, AMYLASE in the last 168 hours. No results for input(s): AMMONIA in the last 168 hours. Coagulation Profile: No results for input(s): INR, PROTIME in the last 168 hours. Cardiac Enzymes: No results for input(s): CKTOTAL, CKMB, CKMBINDEX, TROPONINI in the last 168 hours. BNP (last 3 results) No results for input(s): PROBNP in the last 8760 hours. HbA1C: No results for input(s): HGBA1C in the last 72 hours. CBG: Recent Labs  Lab 07/18/20 2112  GLUCAP 145*   Lipid Profile: No results for input(s): CHOL, HDL, LDLCALC, TRIG, CHOLHDL, LDLDIRECT in the last 72 hours. Thyroid Function Tests: No results for input(s): TSH, T4TOTAL, FREET4, T3FREE, THYROIDAB in the last 72 hours. Anemia Panel: No results for input(s): VITAMINB12, FOLATE, FERRITIN, TIBC, IRON, RETICCTPCT in the last 72 hours. Urine analysis:    Component Value Date/Time   COLORURINE YELLOW 05/23/2020 2057   APPEARANCEUR CLEAR 05/23/2020 2057   LABSPEC 1.008 05/23/2020 2057   PHURINE 5.0 05/23/2020 2057   GLUCOSEU 50 (A) 05/23/2020 2057   HGBUR LARGE (A) 05/23/2020 2057   BILIRUBINUR NEGATIVE 05/23/2020 2057   BILIRUBINUR neg 10/01/2014 1445   KETONESUR NEGATIVE 05/23/2020 2057   PROTEINUR 100 (A) 05/23/2020 2057   UROBILINOGEN 0.2 10/01/2014 1445   UROBILINOGEN 0.2 09/30/2014 2051   NITRITE NEGATIVE 05/23/2020 2057   LEUKOCYTESUR NEGATIVE 05/23/2020 2057   Sepsis Labs: @LABRCNTIP (procalcitonin:4,lacticidven:4)  ) Recent Results (from the past 240 hour(s))  SARS Coronavirus 2 by RT PCR (hospital order, performed in [place] hospital lab) Nasopharyngeal Nasopharyngeal Swab     Status: None   Collection Time: 07/18/20  4:47 PM   Specimen: Nasopharyngeal Swab  Result Value Ref Range Status   SARS Coronavirus 2 NEGATIVE NEGATIVE Final    Comment: (NOTE) SARS-CoV-2  target nucleic acids are NOT DETECTED.  The SARS-CoV-2 RNA is generally detectable in upper and lower respiratory specimens during the acute phase of infection. The lowest concentration of SARS-CoV-2 viral copies this assay can detect is 250 copies / mL. A negative result does not preclude SARS-CoV-2 infection and should not be used as the sole basis for treatment or other patient management decisions.  A negative result may occur with improper specimen collection / handling, submission of specimen other than nasopharyngeal swab, presence of viral mutation(s) within the areas targeted by this assay, and inadequate number of viral copies (<250 copies / mL). A negative result must be combined with clinical observations, patient history, and epidemiological information.  Fact Sheet for Patients:   StrictlyIdeas.no  Fact Sheet for Healthcare Providers: BankingDealers.co.za  This test is not yet approved or  cleared by the [place] FDA and has been authorized for detection and/or diagnosis of SARS-CoV-2 by FDA under an Emergency Use Authorization (EUA).  This EUA will remain  in effect (meaning this test can be used) for the duration of the COVID-19 declaration under Section 564(b)(1) of the Act, 21 U.S.C. section 360bbb-3(b)(1), unless the authorization is terminated or revoked sooner.  Performed at [facility], [address], [place], [place] [address]          Radiology Studies: No results found.      Scheduled Meds: . amLODipine  5 mg Oral Daily  . atorvastatin  20 mg Oral Daily  . bisacodyl  10 mg Oral Once  . Chlorhexidine Gluconate Cloth  6 each Topical Daily  . Chlorhexidine Gluconate Cloth  6 each Topical Q0600  . hydrALAZINE  100 mg Oral TID  . insulin aspart  0-15 Units Subcutaneous TID WC  . insulin aspart  0-5 Units Subcutaneous QHS  . insulin glargine  20 Units Subcutaneous BID  . isosorbide  mononitrate  30 mg Oral Daily  . metoprolol tartrate  100 mg Oral BID  . polyethylene glycol powder  0.5 Container Oral Once  . prazosin  2 mg Oral QPC supper  . sodium chloride flush  3 mL Intravenous Q12H   Continuous Infusions: . sodium chloride       LOS: 0 days    Time spent: 58min  Domenic Polite, MD [facility]  07/19/2020, 11:21 AM

## 2020-07-19 NOTE — Consult Note (Signed)
                                                                           [facility] Consult: 8:19 AM 07/19/2020  LOS: 0 days    Referring Provider: Dr Joseph  Primary Care Physician:  Tisovec, Richard W, MD Primary Gastroenterologist:  Dr.Pyrtle    Reason for Consultation:  Recurrent FOBT + anemia.     HPI: Alexander [name] is a 59 y.o. male.  PMH IDDM.  Obesity. CAD w stenting.  PAD.  A fib.  CHF-D.  Chronic Eliquis.  Heart block.  Cardiac pacemaker. LE revascularization and amputation. ESRD, just recently started HD TTS.  Anemia, transfsued PRBCs in 2018, 01/2020 (#2) and 03/2020 (#3).       09/2014 Colonoscopy.  Screening study.  5, sessile polyps, 4 to 6 mm size. O/w normal study.  Path: TAs.  05/24/2020 EGD for FOBT + dark stools.  Normal to D 3.   05/25/20 Colonoscopy: Normal study to TI, "GI signining off - based upon what I know now I think decreased Hgb likely related to acute kidney injury more than any other possible bleeding - she had darlk stools but did not clearly have melena".  No plans for further studies. Hgb nadir of 8.4, was not transfused with PRBCs.  Returns for admission w pancytopenia, Hgb 5.8; was 9.8 on 7/24. MCV 91.   Platelets 80 K, were in low 100s in 05/2020. WBCs 3.   No c/o melena, BPR, N/V, abd pain, SOB, weakness, presyncope, dizziness, angina.  The only unusual bleeding as she is he has had is he developed a large bruise in his lower right arm within the past few weeks but this is resolving.  There is been no significant bleeding from his dialysis access site in his upper right chest.  In order to save money, the patient has been taking Eliquis just once instead of twice a day.  He is in the "donut hole "and has difficulty affording his medication.  He is taking his low-dose aspirin daily.  Cytoxan (for glomerulonephritis) ran out about 2 weeks ago and  patient asked about this in dialysis as well as made a call to the [facility] pharmacy where it had been filled but had not gotten any refills and was not sure if he was exposed to keep taking this.  Patient recalls having received Aranesp as well as iron infusions as an outpatient in the past but not sure if he is getting CSA or Feraheme now that he is a dialysis patient.  Lives with his wife.  Drinks one half bottle of wine daily.     Past Medical History:  Diagnosis Date  . Alcohol abuse   . Anemia   . Arthritis    "patient does not think so."  . Atrial fibrillation (HCC)   . Cardiac syncope 10/07/14   rapid A fib with 8 sec pauses on converison with syncope- temp pacing wire placed then PPM  . Cataract    BILATERAL  . Chronic kidney disease   . Coronary artery disease   . Diabetes mellitus    dx---been  awhile  . Encounter for care of pacemaker 10/08/2014  .   History of blood transfusion   . Hyperlipemia   . Hypertension   . Osteomyelitis (HCC)    right foot  . Peripheral vascular disease (HCC)   . Presence of permanent cardiac pacemaker 10/08/2014  . Sinus node dysfunction (HCC) 10/08/2014    Past Surgical History:  Procedure Laterality Date  . ABDOMINAL AORTOGRAM W/LOWER EXTREMITY N/A 09/09/2017   Procedure: ABDOMINAL AORTOGRAM W/LOWER EXTREMITY;  Surgeon: Dickson, [name], MD;  [facility]: [facility];  Service: Cardiovascular;  Laterality: N/A;  . ABDOMINAL AORTOGRAM W/LOWER EXTREMITY Right 11/11/2019   Procedure: ABDOMINAL AORTOGRAM W/LOWER EXTREMITY;  Surgeon: Clark, Christopher J, MD;  [facility]: [facility];  Service: Cardiovascular;  Laterality: Right;  . AMPUTATION Left 08/19/2013   Procedure: AMPUTATION RAY;  Surgeon: [name] Graves, MD;  Location: [facility];  Service: Orthopedics;  Laterality: Left;  ray amputation left 5th  . AMPUTATION Left 10/27/2013   Procedure: AMPUTATION DIGIT-LEFT 4TH TOE, 4th and 5th metatarsal.;  Surgeon: [name]  Dickson, MD;  [facility]: [facility];  Service: Vascular;  Laterality: Left;  . AMPUTATION Left 11/04/2013   Procedure: LEFT FOOT TRANS-METATARSAL AMPUTATION WITH WOUND CLOSURE ;  Surgeon: [name] Graves, MD;  [facility]: [facility];  Service: Orthopedics;  Laterality: Left;  . AMPUTATION Right 09/10/2017   Procedure: RIGHT FOURTH AND FIFTH TOE AMPUTATION;  Surgeon: Dickson, [name], MD;  [facility];  Service: Vascular;  Laterality: Right;  . AMPUTATION Right 02/19/2020   Procedure: AMPUTATION RIGHT 3RD TOE;  Surgeon: Dickson, [name], MD;  [facility];  Service: Vascular;  Laterality: Right;  . AMPUTATION Right 03/11/2020   Procedure: RIGHT BELOW KNEE AMPUTATION;  Surgeon: Duda, Marcus V, MD;  [facility]: [facility];  Service: Orthopedics;  Laterality: Right;  . APPLICATION OF WOUND VAC Right 02/19/2020   Procedure: Application Of Wound Vac Right foot;  Surgeon: Dickson, [name], MD;  [facility];  Service: Vascular;  Laterality: Right;  . CARDIAC CATHETERIZATION  10/07/2014   Procedure: LEFT HEART CATH AND CORONARY ANGIOGRAPHY;  Surgeon: Jagadeesh R Ganji, MD;  [facility];  Service: Cardiovascular;;  . COLONOSCOPY W/ POLYPECTOMY    . COLONOSCOPY WITH PROPOFOL N/A 05/25/2020   Procedure: COLONOSCOPY WITH PROPOFOL;  Surgeon: Gessner, Carl E, MD;  Location: [facility];  Service: Endoscopy;  Laterality: N/A;  . EMBOLECTOMY Right 09/12/2017   Procedure: Thrombectomy  & Redo Right Below Knee Popliteal Artery Bypass Graft  ;  Surgeon: Cain, Brandon Christopher, MD;  [facility];  Service: Vascular;  Laterality: Right;  . ESOPHAGOGASTRODUODENOSCOPY (EGD) WITH PROPOFOL N/A 05/24/2020   Procedure: ESOPHAGOGASTRODUODENOSCOPY (EGD) WITH PROPOFOL;  Surgeon: Stark, Malcolm T, MD;  Location: [facility];  Service: Endoscopy;  Laterality: N/A;  . EYE SURGERY Bilateral    cataract  . FEMORAL-TIBIAL BYPASS GRAFT Left 10/27/2013   Procedure: BYPASS GRAFT LEFT FEMORAL- POSTERIOR TIBIAL  ARTERY;  Surgeon: [name] Dickson, MD;  [facility];  Service: Vascular;  Laterality: Left;  . FEMORAL-TIBIAL BYPASS GRAFT Right 09/10/2017   Procedure: RIGHT SUPERFICIAL  FEMORAL ARTERY-BELOW KNEE POPLITEAL ARTERY BYPASS GRAFT WITH VEIN;  Surgeon: Dickson, [name], MD;  [facility];  Service: Vascular;  Laterality: Right;  . I & D EXTREMITY Left 10/27/2013   Procedure: IRRIGATION AND DEBRIDEMENT EXTREMITY- LEFT FOOT;  Surgeon: [name] Dickson, MD;  [facility];  Service: Vascular;  Laterality: Left;  . I & D EXTREMITY Right 01/26/2020   Procedure: IRRIGATION AND DEBRIDEMENT EXTREMITY RIGHT FOOT WOUND;    Surgeon: Dickson, [name], MD;  [facility];  Service: Vascular;  Laterality: Right;  . INTRAOPERATIVE ARTERIOGRAM Right 09/10/2017   Procedure: INTRA OPERATIVE ARTERIOGRAM;  Surgeon: Dickson, [name], MD;  [facility];  Service: Vascular;  Laterality: Right;  . IR ANGIOGRAM FOLLOW UP STUDY  09/09/2017  . IR FLUORO GUIDE CV LINE RIGHT  05/31/2020  . IR US GUIDE VASC ACCESS RIGHT  06/01/2020  . LEFT HEART CATHETERIZATION WITH CORONARY ANGIOGRAM N/A 11/09/2014   Procedure: LEFT HEART CATHETERIZATION WITH CORONARY ANGIOGRAM;  Surgeon: Jagadeesh R Ganji, MD;  [facility];  Service: Cardiovascular;  Laterality: N/A;  . LOWER EXTREMITY ANGIOGRAM Right 11/08/2015   Procedure: Lower Extremity Angiogram;  Surgeon: Vance W Brabham, MD;  [facility];  Service: Cardiovascular;  Laterality: Right;  . LUMBAR LAMINECTOMY  06/13/2012   Procedure: MICRODISCECTOMY LUMBAR LAMINECTOMY;  Surgeon: James E Nitka, MD;  [facility];  Service: Orthopedics;  Laterality: N/A;  Central laminectomy L2-3, L3-4, L4-5  . PACEMAKER INSERTION  10/08/14   MDT Advisa MRI compatible dual chamber pacemaker implanted by Dr Klein for syncope with post-termination pauses  . PERCUTANEOUS CORONARY STENT INTERVENTION (PCI-S)  11/09/2014   des to lad & distal  circumflex         Dr  Ganji  . PERIPHERAL VASCULAR BALLOON ANGIOPLASTY Right 11/11/2019   Procedure: PERIPHERAL VASCULAR BALLOON ANGIOPLASTY;  Surgeon: Clark, Christopher J, MD;  [facility];  Service: Cardiovascular;  Laterality: Right;  below knee popliteal, tibioperoneal trunk, posterior tibial arteries  . PERIPHERAL VASCULAR CATHETERIZATION N/A 11/08/2015   Procedure: Abdominal Aortogram;  Surgeon: Vance W Brabham, MD;  [facility];  Service: Cardiovascular;  Laterality: N/A;  . PERIPHERAL VASCULAR CATHETERIZATION Right 11/08/2015   Procedure: Peripheral Vascular Atherectomy;  Surgeon: Vance W Brabham, MD;  [facility];  Service: Cardiovascular;  Laterality: Right;  . PERIPHERAL VASCULAR CATHETERIZATION N/A 10/10/2016   Procedure: Lower Extremity Angiography;  Surgeon: Brandon Christopher Cain, MD;  [facility];  Service: Cardiovascular;  Laterality: N/A;  . PERIPHERAL VASCULAR CATHETERIZATION Right 10/10/2016   Procedure: Peripheral Vascular Atherectomy;  Surgeon: Brandon Christopher Cain, MD;  [facility];  Service: Cardiovascular;  Laterality: Right;  Popliteal  . PERMANENT PACEMAKER INSERTION N/A 10/08/2014   Procedure: PERMANENT PACEMAKER INSERTION;  Surgeon: Steven C Klein, MD;  [facility];  Service: Cardiovascular;  Laterality: N/A;  . TEMPORARY PACEMAKER INSERTION Bilateral 10/07/2014   Procedure: TEMPORARY PACEMAKER INSERTION;  Surgeon: Jagadeesh R Ganji, MD;  [facility];  Service: Cardiovascular;  Laterality: Bilateral;  . WOUND DEBRIDEMENT Right 02/19/2020   Procedure: DEBRIDEMENT WOUND RIGHT FOOT;  Surgeon: Dickson, [name], MD;  [facility];  Service: Vascular;  Laterality: Right;    Prior to Admission medications   Medication Sig Start Date End Date Taking? Authorizing Provider  amLODipine (NORVASC) 5 MG tablet Take 5 mg by mouth daily. 06/09/20  Yes [provider]  apixaban (ELIQUIS) 5 MG TABS tablet Take 1 tablet (5 mg total) by mouth 2 (two) times daily. 07/13/20 10/11/20 Yes Tolia, Sunit, DO  aspirin EC 81 MG tablet Take 81 mg by mouth daily. Swallow whole.   Yes [provider]  atorvastatin (LIPITOR) 20 MG tablet Take 1 tablet by mouth once daily Patient taking differently: Take 20 mg by mouth daily.  12/24/19  Yes Kelley, Ashton Haynes, NP  hydrALAZINE (APRESOLINE) 100 MG tablet   TAKE 1 TABLET BY MOUTH THREE TIMES DAILY Patient taking differently: Take 100 mg by mouth 3 (three) times daily.  02/24/20  Yes Ganji, Jay, MD  insulin NPH-regular Human (70-30) 100 UNIT/ML injection Inject 46-56 Units into the skin 2 (two) times daily with a meal. Take 56 units in the morning and Take 46 in the evening   Yes [provider]  metoprolol tartrate (LOPRESSOR) 100 MG tablet Take 100 mg by mouth 2 (two) times daily.   Yes [provider]  Multiple Vitamin (MULTIVITAMIN ADULT) TABS Take 1 tablet by mouth daily.   Yes [provider]  prazosin (MINIPRESS) 2 MG capsule Take 1 capsule (2 mg total) by mouth daily after supper. 06/27/20  Yes Ganji, Jay, MD  ACCU-CHEK FASTCLIX LANCETS MISC Check blood sugar TID & QHS 10/01/14   Advani, Deepak, MD  blood glucose meter kit and supplies KIT Check blood sugar TID & QHS 02/10/15   Advani, Deepak, MD  Blood Glucose Monitoring Suppl (ACCU-CHEK ADVANTAGE DIABETES) kit Use as instructed 10/01/14   Advani, Deepak, MD  cyclophosphamide (CYTOXAN) 50 MG capsule Take 50 mg by mouth daily. Take with food to minimize GI upset. Take early in the day and maintain hydration.    [provider]  glucose blood (ACCU-CHEK AVIVA PLUS) test strip Use as instructed 10/01/14   Advani, Deepak, MD  glucose blood (CHOICE DM FORA G20 TEST STRIPS) test strip Check blood sugar TID & QHS 02/10/15   Advani, Deepak, MD  glucose blood test strip Use as instructed 11/20/13   Abrol, Nayana, MD  isosorbide  mononitrate (IMDUR) 30 MG 24 hr tablet Take 1 tablet (30 mg total) by mouth daily. 07/13/20 08/12/20  Tolia, Sunit, DO    Scheduled Meds: . amLODipine  5 mg Oral Daily  . atorvastatin  20 mg Oral Daily  . Chlorhexidine Gluconate Cloth  6 each Topical Daily  . hydrALAZINE  100 mg Oral TID  . insulin aspart  0-15 Units Subcutaneous TID WC  . insulin aspart  0-5 Units Subcutaneous QHS  . insulin glargine  20 Units Subcutaneous BID  . isosorbide mononitrate  30 mg Oral Daily  . metoprolol tartrate  100 mg Oral BID  . pantoprazole (PROTONIX) IV  40 mg Intravenous Q12H  . prazosin  2 mg Oral QPC supper  . sodium chloride flush  3 mL Intravenous Q12H   Infusions: . sodium chloride     PRN Meds: acetaminophen **OR** acetaminophen, hydrALAZINE, ondansetron **OR** ondansetron (ZOFRAN) IV   Allergies as of 07/18/2020 - Review Complete 07/18/2020  Allergen Reaction Noted  . Keflex [cephalexin] Nausea And Vomiting 12/25/2019  . Pletal [cilostazol] Palpitations 09/18/2017    Family History  Problem Relation Age of Onset  . Diabetes type II Mother   . Hypertension Mother   . Diabetes Mother   . Liver cancer Father   . Diabetes type II Sister   . Breast cancer Sister   . Diabetes Sister   . Hypertension Sister   . Diabetes type II Brother   . Kidney failure Brother   . Diabetes Brother   . Hypertension Brother   . Diabetes type II Sister     Social History   Socioeconomic History  . Marital status: Married    Spouse name: Not on file  . Number of children: 2  . Years of education: Not on file  . Highest education level: Not on file  Occupational History  . Occupation: Disabled  Tobacco Use  .   Smoking status: Former Smoker    Packs/day: 1.50    Years: 30.00    Pack years: 45.00    Types: Cigarettes    Quit date: 10/07/2014    Years since quitting: 5.7  . Smokeless tobacco: Never Used  Vaping Use  . Vaping Use: Never used  Substance and Sexual Activity  . Alcohol use:  Yes    Alcohol/week: 6.0 standard drinks    Types: 6 Cans of beer per week    Comment: 2-3 beers daily  . Drug use: No  . Sexual activity: Not on file  Other Topics Concern  . Not on file  Social History Narrative  . Not on file   Social Determinants of Health   Financial Resource Strain:   . Difficulty of Paying Living Expenses: Not on file  Food Insecurity:   . Worried About Running Out of Food in the Last Year: Not on file  . Ran Out of Food in the Last Year: Not on file  Transportation Needs:   . Lack of Transportation (Medical): Not on file  . Lack of Transportation (Non-Medical): Not on file  Physical Activity:   . Days of Exercise per Week: Not on file  . Minutes of Exercise per Session: Not on file  Stress:   . Feeling of Stress : Not on file  Social Connections:   . Frequency of Communication with Friends and Family: Not on file  . Frequency of Social Gatherings with Friends and Family: Not on file  . Attends Religious Services: Not on file  . Active Member of Clubs or Organizations: Not on file  . Attends Club or Organization Meetings: Not on file  . Marital Status: Not on file  Intimate Partner Violence:   . Fear of Current or Ex-Partner: Not on file  . Emotionally Abused: Not on file  . Physically Abused: Not on file  . Sexually Abused: Not on file    REVIEW OF SYSTEMS: Constitutional: No profound weakness or fatigue. ENT:  No nose bleeds Pulm: No breathing difficulties.  No cough. CV:  No palpitations, no LE edema.  No angina GU:  No hematuria, no frequency.  Still makes urine. GI: See HPI Heme: No unusual bleeding or bruising Transfusions: See HPI Neuro:  No headaches, no peripheral tingling or numbness.  No syncope, no seizures. Derm:  No itching, no rash or sores.  Endocrine:  No sweats or chills.  No polyuria or dysuria Immunization: Has been vaccinated for COVID-19.    PHYSICAL EXAM: Vital signs in last 24 hours: Vitals:   07/19/20 0030  07/19/20 0435  BP: 138/75 118/68  Pulse: 65 65  Resp: 16 16  Temp: 98.1 F (36.7 C) 98.6 F (37 C)  SpO2: 97% 99%   Wt Readings from Last 3 Encounters:  07/18/20 118.8 kg  07/13/20 122.5 kg  06/29/20 120.7 kg    General: Pleasant, nonill appearing, obese, comfortable, alert Head: No facial asymmetry or swelling.  No signs of head trauma. Eyes: No scleral icterus, no conjunctival pallor.  EOMI Ears: Not hard of hearing Nose: No congestion or discharge Mouth: Dentition in poor repair.  Tongue midline.  Mucosa moist, pink, clear. Neck: No JVD, no masses, no thyromegaly Lungs: No labored breathing or cough.  Lungs clear with good breath sounds bilaterally. Heart: RRR.  No MRG.  S1, S2 present Abdomen: Obese, soft.  No masses, HSM, bruits, hernias.  Active bowel sounds..   Rectal: Deferred Musc/Skeltl: No joint redness, swelling or gross   deformity.  Left forefoot amputation, no toes remain.  Right BKA. Extremities: Nonpitting swelling in the right lower extremity into the foot. Neurologic: Alert.  Oriented x3.  No tremors, no limb weakness.  No gross deficits Skin: No telangiectasia, no sores, no rashes, no suspicious lesions.  Hint of resolving hematoma in right forearm. Nodes: No cervical adenopathy Psych: Calm, pleasant, cooperative.  Intake/Output from previous day: 09/06 0701 - 09/07 0700 In: 603.5 [P.O.:120; I.V.:100; Blood:383.5] Out: 0  Intake/Output this shift: No intake/output data recorded.  LAB RESULTS: Recent Labs    07/18/20 1311 07/19/20 0615  WBC 3.0* 3.6*  HGB 5.6* 7.0*  HCT 18.3* 21.6*  PLT 80* 94*   BMET Lab Results  Component Value Date   NA 137 07/19/2020   NA 135 07/18/2020   NA 134 (L) 06/04/2020   K 4.1 07/19/2020   K 3.8 07/18/2020   K 4.6 06/04/2020   CL 98 07/19/2020   CL 95 (L) 07/18/2020   CL 102 06/04/2020   CO2 30 07/19/2020   CO2 29 07/18/2020   CO2 20 (L) 06/04/2020   GLUCOSE 173 (H) 07/19/2020   GLUCOSE 267 (H) 07/18/2020     GLUCOSE 377 (H) 06/04/2020   BUN 35 (H) 07/19/2020   BUN 31 (H) 07/18/2020   BUN 94 (H) 06/04/2020   CREATININE 5.66 (H) 07/19/2020   CREATININE 5.21 (H) 07/18/2020   CREATININE 5.85 (H) 06/04/2020   CALCIUM 8.8 (L) 07/19/2020   CALCIUM 8.8 (L) 07/18/2020   CALCIUM 7.8 (L) 06/04/2020   LFT Recent Labs    07/18/20 1311  PROT 5.5*  ALBUMIN 3.2*  AST 18  ALT 22  ALKPHOS 65  BILITOT 0.8   PT/INR Lab Results  Component Value Date   INR 1.2 05/26/2020   INR 1.7 (H) 03/11/2020   INR 1.2 01/26/2020   Hepatitis Panel No results for input(s): HEPBSAG, HCVAB, HEPAIGM, HEPBIGM in the last 72 hours. C-Diff No components found for: CDIFF Lipase  No results found for: LIPASE  Drugs of Abuse     Component Value Date/Time   LABOPIA NONE DETECTED 05/01/2014 0348   COCAINSCRNUR NONE DETECTED 05/01/2014 0348   LABBENZ NONE DETECTED 05/01/2014 0348   AMPHETMU NONE DETECTED 05/01/2014 0348   THCU NONE DETECTED 05/01/2014 0348   LABBARB NONE DETECTED 05/01/2014 0348     RADIOLOGY STUDIES: No results found.    IMPRESSION:   *   FOBT + anemia. In setting of pancytopenia.   Recent unrevealing EGD, colonoscopy in 05/2020. Hgb 5.6 >> 7.  S/p 2 PRBCs.    *    New ESRD.  HD on TTS initiated  *   CAD, PVD.  A fib, LE amputations.  A fib Chronic Eliquis, last dose was 9/6.      PLAN:     *   SB enteroscopy and possible placement of VCE at time of procedure.   Discussed the half dose MiraLAX bowel prep with renal MD, Dr. Schertz, and this is ok.   Clear dinner, NPO after 10 PM.   Stopped IV Protonix, there was no indication for this at 05/2020 EGD, there are no GERD/PUD sxs, no melena.     Cheray Pardi  07/19/2020, 8:19 AM Phone [phone]     

## 2020-07-20 ENCOUNTER — Encounter (HOSPITAL_COMMUNITY)
Admission: EM | Disposition: A | Discharge status: Home / Self Care | Payer: Self-pay | Source: Home / Self Care | Attending: Internal Medicine

## 2020-07-20 ENCOUNTER — Encounter (HOSPITAL_COMMUNITY): Payer: Self-pay | Admitting: Internal Medicine

## 2020-07-20 ENCOUNTER — Inpatient Hospital Stay (HOSPITAL_COMMUNITY): Payer: Medicare Other | Admitting: Anesthesiology

## 2020-07-20 DIAGNOSIS — K3189 Other diseases of stomach and duodenum: Secondary | ICD-10-CM

## 2020-07-20 DIAGNOSIS — K922 Gastrointestinal hemorrhage, unspecified: Secondary | ICD-10-CM

## 2020-07-20 HISTORY — PX: BIOPSY: SHX5522

## 2020-07-20 HISTORY — PX: GIVENS CAPSULE STUDY: SHX5432

## 2020-07-20 HISTORY — PX: ENTEROSCOPY: SHX5533

## 2020-07-20 HISTORY — PX: SUBMUCOSAL TATTOO INJECTION: SHX6856

## 2020-07-20 LAB — GLUCOSE, CAPILLARY
Glucose-Capillary: 146 mg/dL — ABNORMAL HIGH (ref 70–99)
Glucose-Capillary: 129 mg/dL — ABNORMAL HIGH (ref 70–99)
Glucose-Capillary: 194 mg/dL — ABNORMAL HIGH (ref 70–99)
Glucose-Capillary: 178 mg/dL — ABNORMAL HIGH (ref 70–99)

## 2020-07-20 LAB — BASIC METABOLIC PANEL
Anion gap: 11 (ref 5–15)
BUN: 19 mg/dL (ref 8–23)
CO2: 24 mmol/L (ref 22–32)
Calcium: 8.3 mg/dL — ABNORMAL LOW (ref 8.9–10.3)
Chloride: 103 mmol/L (ref 98–111)
Creatinine, Ser: 4.05 mg/dL — ABNORMAL HIGH (ref 0.61–1.24)
GFR calc Af Amer: 17 mL/min — ABNORMAL LOW (ref >60)
GFR calc non Af Amer: 15 mL/min — ABNORMAL LOW (ref >60)
Glucose, Bld: 155 mg/dL — ABNORMAL HIGH (ref 70–99)
Potassium: 4.3 mmol/L (ref 3.5–5.1)
Sodium: 138 mmol/L (ref 135–145)

## 2020-07-20 LAB — CBC
HCT: 22 % — ABNORMAL LOW (ref 39.0–52.0)
Hemoglobin: 7.1 g/dL — ABNORMAL LOW (ref 13.0–17.0)
MCH: 30.6 pg (ref 26.0–34.0)
MCHC: 32.3 g/dL (ref 30.0–36.0)
MCV: 94.8 fL (ref 80.0–100.0)
Platelets: 95 10*3/uL — ABNORMAL LOW (ref 150–400)
RBC: 2.32 MIL/uL — ABNORMAL LOW (ref 4.22–5.81)
RDW: 16.7 % — ABNORMAL HIGH (ref 11.5–15.5)
WBC: 3.9 10*3/uL — ABNORMAL LOW (ref 4.0–10.5)
nRBC: 1.8 % — ABNORMAL HIGH (ref 0.0–0.2)

## 2020-07-20 SURGERY — ENTEROSCOPY
Anesthesia: Monitor Anesthesia Care

## 2020-07-20 MED ORDER — PHENYLEPHRINE 40 MCG/ML (10ML) SYRINGE FOR IV PUSH (FOR BLOOD PRESSURE SUPPORT)
PREFILLED_SYRINGE | INTRAVENOUS | Status: DC | PRN
  Administered 2020-07-20: 120 ug via INTRAVENOUS
  Administered 2020-07-20: 80 ug via INTRAVENOUS
  Administered 2020-07-20: 120 ug via INTRAVENOUS
  Administered 2020-07-20: 80 ug via INTRAVENOUS

## 2020-07-20 MED ORDER — EPHEDRINE SULFATE-NACL 50-0.9 MG/10ML-% IV SOSY
PREFILLED_SYRINGE | INTRAVENOUS | Status: DC | PRN
  Administered 2020-07-20 (×2): 5 mg via INTRAVENOUS

## 2020-07-20 MED ORDER — CHLORHEXIDINE GLUCONATE CLOTH 2 % EX PADS
6.0000 | MEDICATED_PAD | Freq: Every day | CUTANEOUS | Status: DC
  Administered 2020-07-21 – 2020-07-22 (×2): 6 via TOPICAL

## 2020-07-20 MED ORDER — SODIUM CHLORIDE 0.9 % IV SOLN: INTRAVENOUS | Status: DC | PRN

## 2020-07-20 MED ORDER — PROPOFOL 10 MG/ML IV BOLUS
INTRAVENOUS | Status: DC | PRN
  Administered 2020-07-20: 30 mg via INTRAVENOUS
  Administered 2020-07-20: 20 mg via INTRAVENOUS
  Administered 2020-07-20: 50 mg via INTRAVENOUS
  Administered 2020-07-20: 20 mg via INTRAVENOUS

## 2020-07-20 MED ORDER — SPOT INK MARKER SYRINGE KIT
PACK | SUBMUCOSAL | Status: DC | PRN
  Administered 2020-07-20: 1 mL via SUBMUCOSAL

## 2020-07-20 MED ORDER — PROPOFOL 500 MG/50ML IV EMUL
INTRAVENOUS | Status: DC | PRN
  Administered 2020-07-20: 75 ug/kg/min via INTRAVENOUS

## 2020-07-20 MED ORDER — LIDOCAINE 2% (20 MG/ML) 5 ML SYRINGE
INTRAMUSCULAR | Status: DC | PRN
  Administered 2020-07-20: 60 mg via INTRAVENOUS

## 2020-07-20 MED ORDER — SPOT INK MARKER SYRINGE KIT
PACK | SUBMUCOSAL | Status: AC
  Filled 2020-07-20: qty 5

## 2020-07-20 NOTE — Anesthesia Postprocedure Evaluation (Signed)
Anesthesia Post Note  Patient: Alexander [name]  Procedure(s) Performed: ENTEROSCOPY (N/A ) GIVENS CAPSULE STUDY (N/A ) SUBMUCOSAL TATTOO INJECTION BIOPSY     Patient location during evaluation: PACU Anesthesia Type: MAC Level of consciousness: awake and alert Pain management: pain level controlled Vital Signs Assessment: post-procedure vital signs reviewed and stable Respiratory status: spontaneous breathing, nonlabored ventilation, respiratory function stable and patient connected to nasal cannula oxygen Cardiovascular status: stable and blood pressure returned to baseline Postop Assessment: no apparent nausea or vomiting Anesthetic complications: no   No complications documented.  Last Vitals:  Vitals:   07/20/20 1015 07/20/20 1027  BP: 139/74 125/62  Pulse: 77 64  Resp: 17 18  Temp:  36.6 C  SpO2: 100% 97%    Last Pain:  Vitals:   07/20/20 1015  TempSrc:   PainSc: 0-No pain                 Barnet Glasgow

## 2020-07-20 NOTE — Progress Notes (Signed)
PROGRESS NOTE    DERWARD MARPLE  VOJ:[id number] DOB: 09-17-1961 DOA: 07/18/2020 PCP: [name], MD   Brief Narrative: 59 year old with past medical history significant for ESRD, AKI due to necrotizing glomerulonephritis/ANCA vasculitis started on hemodialysis in July, chronic Cytoxan therapy started 23-month prior, chronic anemia recent extensive GI work-up, peripheral vascular disease, right BKA, chronic diastolic heart failure, paroxysmal A. fib on Eliquis, CAD, diabetes type 2 who presents to the emergency department due to severe anemia noticed on lab with a hemoglobin of 5.8.  He denies overt hematemesis or melena, he took Cytoxan for about a month after his discharge in July and after the run out of this medicine. Heme positive in the ED.  Also noted to have pancytopenia.  He received 2 unit of packed red blood cell during admission.  Assessment & Plan:   Active Problems:   Acute blood loss anemia   1-Acute on chronic anemia, symptomatic anemia, pancytopenia: Suspect multifactorial, Endoscopy showed stable duodenitis, small gastric erosion no evidence of bleeding. Had video capsule today.  If unrevealing no further GI evaluation plan had Received 2 unit of packed red blood cell. Continue to hold Eliquis until tomorrow.  Patient would like to hold on starting heparin Will order anemia panel for tomorrow am.  Hb stable at 7.   2-ESRD necrotizing glomerulonephritis/ANCA vasculitis Continue with hemodialysis, nephrology following Defer to nephrology reinitiation of Cytoxan  Hypertension: Continue with metoprolol.  Hold amlodipine due to soft blood pressure.  Holding parameters for hydralazine  Diabetes type 2: Continue Lantus and a sliding scale  Peripheral vascular disease a status post right BKA   Estimated body mass index is 35.1 kg/m as calculated from the following:   Height as of this encounter: 6' (1.829 m).   Weight as of this encounter: 117.4 kg.   DVT  prophylaxis: SCDs Code Status: Full code Family Communication: Care discussed with patient Disposition Plan:  Status is: Inpatient  Remains inpatient appropriate because:Ongoing diagnostic testing needed not appropriate for outpatient work up   Dispo: The patient is from: Home              Anticipated d/c is to: Home              Anticipated d/c date is: 1 day              Patient currently is not medically stable to d/c.        Consultants:   Nephrology  GI  Procedures:  EndoscopyNo gross lesions in esophagus. Z-line regular, 45 cm from the incisors.                           - Erosive gastropathy with no bleeding and no                            stigmata of recent bleeding. No other gross lesions                            in the stomach. Biopsied.                           - Erythematous duodenopathy in Bulb/D1/D2.                            Otherwise  normal mucosa was found in the entire                            examined duodenum. Melanosis noted.                           - Normal mucosa was found in the visualized                            proximal jejunum. Melanosis noted. Tattooed distal                             extent of SBE today.  Antimicrobials:    Subjective: He denies pain, hematochezia, melena  Objective: Vitals:   07/20/20 0955 07/20/20 1005 07/20/20 1015 07/20/20 1027  BP: 118/89 138/65 139/74 125/62  Pulse: 70 71 77 64  Resp: 20 19 17 18   Temp:    97.9 F (36.6 C)  TempSrc:      SpO2: 100% 100% 100% 97%  Weight:      Height:        Intake/Output Summary (Last 24 hours) at 07/20/2020 1419 Last data filed at 07/20/2020 0945 Gross per 24 hour  Intake 600 ml  Output 3002 ml  Net -2402 ml   Filed Weights   07/19/20 1230 07/19/20 1610 07/20/20 0936  Weight: 121 kg 117.4 kg 117.4 kg    Examination:  General exam: Appears calm and comfortable  Respiratory system: Clear to auscultation. Respiratory effort normal. Cardiovascular  system: S1 & S2 heard, RRR. No JVD, murmurs, rubs, gallops or clicks. No pedal edema. Gastrointestinal system: Abdomen is nondistended, soft and nontender. No organomegaly or masses felt. Normal bowel sounds heard. Central nervous system: Alert and oriented. No focal neurological deficits. Extremities: left fore foot amputation, right BKA   Data Reviewed: I have personally reviewed following labs and imaging studies  CBC: Recent Labs  Lab 07/18/20 1311 07/19/20 0615 07/20/20 0710  WBC 3.0* 3.6* 3.9*  HGB 5.6* 7.0* 7.1*  HCT 18.3* 21.6* 22.0*  MCV 94.8 91.9 94.8  PLT 80* 94* 95*   Basic Metabolic Panel: Recent Labs  Lab 07/18/20 1311 07/19/20 0615 07/20/20 0710  NA 135 137 138  K 3.8 4.1 4.3  CL 95* 98 103  CO2 29 30 24   GLUCOSE 267* 173* 155*  BUN 31* 35* 19  CREATININE 5.21* 5.66* 4.05*  CALCIUM 8.8* 8.8* 8.3*  MG  --  1.9  --    GFR: Estimated Creatinine Clearance: 25.3 mL/min (A) (by C-G formula based on SCr of 4.05 mg/dL (H)). Liver Function Tests: Recent Labs  Lab 07/18/20 1311  AST 18  ALT 22  ALKPHOS 65  BILITOT 0.8  PROT 5.5*  ALBUMIN 3.2*   No results for input(s): LIPASE, AMYLASE in the last 168 hours. No results for input(s): AMMONIA in the last 168 hours. Coagulation Profile: No results for input(s): INR, PROTIME in the last 168 hours. Cardiac Enzymes: No results for input(s): CKTOTAL, CKMB, CKMBINDEX, TROPONINI in the last 168 hours. BNP (last 3 results) No results for input(s): PROBNP in the last 8760 hours. HbA1C: No results for input(s): HGBA1C in the last 72 hours. CBG: Recent Labs  Lab 07/18/20 2112 07/19/20 1200 07/19/20 1647 07/20/20 0733 07/20/20 1128  GLUCAP 145* 231* 137* 146* 129*   Lipid Profile: No  results for input(s): CHOL, HDL, LDLCALC, TRIG, CHOLHDL, LDLDIRECT in the last 72 hours. Thyroid Function Tests: No results for input(s): TSH, T4TOTAL, FREET4, T3FREE, THYROIDAB in the last 72 hours. Anemia Panel: No  results for input(s): VITAMINB12, FOLATE, FERRITIN, TIBC, IRON, RETICCTPCT in the last 72 hours. Sepsis Labs: No results for input(s): PROCALCITON, LATICACIDVEN in the last 168 hours.  Recent Results (from the past 240 hour(s))  SARS Coronavirus 2 by RT PCR (hospital order, performed in [facility] hospital lab) Nasopharyngeal Nasopharyngeal Swab     Status: None   Collection Time: 07/18/20  4:47 PM   Specimen: Nasopharyngeal Swab  Result Value Ref Range Status   SARS Coronavirus 2 NEGATIVE NEGATIVE Final    Comment: (NOTE) SARS-CoV-2 target nucleic acids are NOT DETECTED.  The SARS-CoV-2 RNA is generally detectable in upper and lower respiratory specimens during the acute phase of infection. The lowest concentration of SARS-CoV-2 viral copies this assay can detect is 250 copies / mL. A negative result does not preclude SARS-CoV-2 infection and should not be used as the sole basis for treatment or other patient management decisions.  A negative result may occur with improper specimen collection / handling, submission of specimen other than nasopharyngeal swab, presence of viral mutation(s) within the areas targeted by this assay, and inadequate number of viral copies (<250 copies / mL). A negative result must be combined with clinical observations, patient history, and epidemiological information.  Fact Sheet for Patients:   StrictlyIdeas.no  Fact Sheet for Healthcare Providers: BankingDealers.co.za  This test is not yet approved or  cleared by the [place] FDA and has been authorized for detection and/or diagnosis of SARS-CoV-2 by FDA under an Emergency Use Authorization (EUA).  This EUA will remain in effect (meaning this test can be used) for the duration of the COVID-19 declaration under Section 564(b)(1) of the Act, 21 U.S.C. section 360bbb-3(b)(1), unless the authorization is terminated or revoked sooner.  Performed at  [facility], [place] [address] [place], [place] [address]          Radiology Studies: No results found.      Scheduled Meds: . atorvastatin  20 mg Oral Daily  . [START ON 07/21/2020] Chlorhexidine Gluconate Cloth  6 each Topical Q0600  . hydrALAZINE  100 mg Oral TID  . insulin aspart  0-15 Units Subcutaneous TID WC  . insulin aspart  0-5 Units Subcutaneous QHS  . insulin glargine  20 Units Subcutaneous BID  . isosorbide mononitrate  30 mg Oral Daily  . metoprolol tartrate  100 mg Oral BID  . prazosin  2 mg Oral QPC supper  . sodium chloride flush  3 mL Intravenous Q12H   Continuous Infusions: . sodium chloride       LOS: 1 day    Time spent: 35 minutes.     Elmarie Shiley, MD [facility]   If 7PM-7AM, please contact night-coverage [email or web address]  07/20/2020, 2:19 PM

## 2020-07-20 NOTE — Progress Notes (Signed)
New Admission Note:  Arrival Method: Stretcher from ED Mental Orientation:  A&Ox4 Telemetry: N/A Assessment: Completed Skin: Intact IV: PIV L AC NSL Pain: 0/10 Tubes: None Safety Measures: Safety Fall Prevention Plan was discussed  Admission: Initiated Belongings: Clothing in bag at bedside Unit Orientation: Patient has been oriented to the room, unit, and the staff.  Orders have been reviewed and implemented. Call light has been placed within reach and bed alarm has been activated. Will continue to monitor the patient.  Percell Boston, RN

## 2020-07-20 NOTE — Progress Notes (Signed)
PT Screen Note  Patient Details Name: Alexander [name] MRN: [id number] DOB: 1961-05-25   Cancelled Treatment:    Reason Eval/Treat Not Completed: PT screened, no needs identified, will sign off Pt reports he is at his baseline.  Pt has been stand pivoting to bsc and completing ADLs independently (nursing staff confirmed).  Pt reports only pivots at home and that he has all necessary DME.  He denies any symptoms of lightheadedness, dizziness, or weakness associated with anemia.  Will sign off.  Abran Richard, PT Acute Rehab Services Pager [phone] [facility] 07/20/2020, 5:23 PM

## 2020-07-20 NOTE — Op Note (Addendum)
[facility] Patient Name: Alexander [name] Procedure Date : 07/20/2020 MRN: [id number] Attending MD: Justice Britain , MD Date of Birth: Mar 28, 1961 CSN: [id number] Age: 59 Admit Type: Inpatient Procedure:                Small bowel enteroscopy Indications:              Anemia, Hemeoccult stool with concern for GI                            bleeding source not documented by previous EGD and                            colonoscopy, Occult blood in stool, History of                            duodenitis Providers:                Justice Britain, MD, Clyde Lundborg, RN, Elspeth Cho Tech., Technician, Vania Rea, CRNA Referring MD:             [facility] Medicines:                Monitored Anesthesia Care Complications:            No immediate complications. Estimated Blood Loss:     Estimated blood loss was minimal. Procedure:                Pre-Anesthesia Assessment:                           - Prior to the procedure, a History and Physical                            was performed, and patient medications and                            allergies were reviewed. The patient's tolerance of                            previous anesthesia was also reviewed. The risks                            and benefits of the procedure and the sedation                            options and risks were discussed with the patient.                            All questions were answered, and informed consent                            was obtained. Prior Anticoagulants: The patient has  taken Eliquis (apixaban), last dose was 2 days                            prior to procedure. ASA Grade Assessment: III - A                            patient with severe systemic disease. After                            reviewing the risks and benefits, the patient was                            deemed in satisfactory condition to undergo the                             procedure.                           After obtaining informed consent, the endoscope was                            passed under direct vision. Throughout the                            procedure, the patient's blood pressure, pulse, and                            oxygen saturations were monitored continuously. The                            [id number]-H190DL ([id number]) Olympus pediatric colonoscope                            was introduced through the mouth and advanced to                            the proximal jejunum. The GIF-H190 ([id number])                            Olympus gastroscope was introduced through the                            mouth and advanced to the jejunal crest. The small                            bowel enteroscopy was accomplished without                            difficulty. The patient tolerated the procedure. Scope In: Scope Out: Findings:      No gross lesions were noted in the entire esophagus.      The Z-line was regular and was found 45 cm from the incisors.      A few dispersed small erosions with no bleeding and no stigmata of  recent bleeding were found in the gastric antrum.      No other gross lesions were noted in the entire examined stomach.       Biopsies were taken with a cold forceps for histology and Helicobacter       pylori testing.      Patchy erythematous mucosa without active bleeding and with no stigmata       of bleeding was found in the duodenal bulb, in the first portion of the       duodenum and in the second portion of the duodenum - consistent with       duodenopathy/duodenitis.      Normal mucosa was found in the entire duodenum otherwise with some       evidence of melanosis.      Normal mucosa was found in the visualized proximal jejunum. There was       evidenence of melanosis. The distal extent of today's SBE was tattooed       with an injection of Spot (carbon black) for demarcation purposes.      Upon  re-evaluation of the small bowel upon scope reduction and removal,       there were some small areas of scope trauma noted that washed away. No       evidence of AVMs in any of the proximal visualized small bowel       (re-evaluated the areas 3 times to ensure).      We transitioned to the EGD scope and the video capsule enteroscope was       advanced into the second portion of the duodenum. Impression:               - No gross lesions in esophagus. Z-line regular, 45                            cm from the incisors.                           - Erosive gastropathy with no bleeding and no                            stigmata of recent bleeding. No other gross lesions                            in the stomach. Biopsied.                           - Erythematous duodenopathy in Bulb/D1/D2.                            Otherwise normal mucosa was found in the entire                            examined duodenum. Melanosis noted.                           - Normal mucosa was found in the visualized                            proximal jejunum. Melanosis noted. Tattooed distal  extent of SBE today.                           - Successful placement of the Video Capsule. Recommendation:           - The patient will be observed post-procedure,                            until all discharge criteria are met.                           - Return patient to hospital ward for ongoing care.                           - NPO x 2-hours then CLD x 2-hours then small snack                            x 2-hours then heart healthy, renal diet thereafter.                           - Await pathology results.                           - We will plan to pick up VCE on 9/9 AM. Hopefully                            will be able to read on 9/9 vs 9/10 to determine if                            any other evidence of GI bleeding could be source                            for recurrent anemia.                            - Appreciate Nephrology and Medical Services on                            further workup of underlying Anemia.                           - GI Inpatient service to follow up after VCE has                            resulted.                           - If anticoagulation is necessary consider heparin                            gtt without bolus in 6-hours and monitor closely.                           - The findings and recommendations were discussed  with the patient.                           - The findings and recommendations were discussed                            with the patient's family.                           - The findings and recommendations were discussed                            with the referring physician. Procedure Code(s):        --- Professional ---                           [id number], Small intestinal endoscopy, enteroscopy                            beyond second portion of duodenum, not including                            ileum; with biopsy, single or multiple                           44799, Unlisted procedure, small intestine Diagnosis Code(s):        --- Professional ---                           K31.89, Other diseases of stomach and duodenum                           D64.9, Anemia, unspecified                           K92.2, Gastrointestinal hemorrhage, unspecified                           R19.5, Other fecal abnormalities CPT copyright 2019 American Medical Association. All rights reserved. The codes documented in this report are preliminary and upon coder review may  be revised to meet current compliance requirements. Justice Britain, MD 07/20/2020 10:01:13 AM Number of Addenda: 0

## 2020-07-20 NOTE — Transfer of Care (Signed)
Immediate Anesthesia Transfer of Care Note  Patient: Alexander [name]  Procedure(s) Performed: ENTEROSCOPY (N/A ) GIVENS CAPSULE STUDY (N/A ) SUBMUCOSAL TATTOO INJECTION BIOPSY  Patient Location: Endoscopy Unit  Anesthesia Type:MAC  Level of Consciousness: awake and patient cooperative  Airway & Oxygen Therapy: Patient Spontanous Breathing and Patient connected to nasal cannula oxygen  Post-op Assessment: Report given to RN and Post -op Vital signs reviewed and stable  Post vital signs: Reviewed and stable  Last Vitals:  Vitals Value Taken Time  BP    Temp    Pulse 82 07/20/20 0944  Resp 15 07/20/20 0944  SpO2 100 % 07/20/20 0944  Vitals shown include unvalidated device data.  Last Pain:  Vitals:   07/20/20 0824  TempSrc: Oral  PainSc: 0-No pain         Complications: No complications documented.

## 2020-07-20 NOTE — Interval H&P Note (Signed)
History and Physical Interval Note:  07/20/2020 8:22 AM  Alexander [name]  has presented today for surgery, with the diagnosis of anemia.  FOBT + brown stool.  The various methods of treatment have been discussed with the patient and family. After consideration of risks, benefits and other options for treatment, the patient has consented to  Procedure(s): ENTEROSCOPY (N/A) GIVENS CAPSULE STUDY (N/A) as a surgical intervention.  The patient's history has been reviewed, patient examined, no change in status, stable for surgery.  I have reviewed the patient's chart and labs.  Questions were answered to the patient's satisfaction.     [employer]

## 2020-07-20 NOTE — Progress Notes (Addendum)
[facility] Progress Note   Subjective:   Patient seen in room, had small bowel enteroscopy this morning and placement of video capsule. Patient is feeling well. No SOB, CP, palpitations, dizziness, abdominal pain, N/V/D.   Objective Vitals:   07/20/20 0955 07/20/20 1005 07/20/20 1015 07/20/20 1027  BP: 118/89 138/65 139/74 125/62  Pulse: 70 71 77 64  Resp: 20 19 17 18   Temp:    97.9 F (36.6 C)  TempSrc:      SpO2: 100% 100% 100% 97%  Weight:       Physical Exam General: Heart: Lungs: Abdomen: Extremities: Dialysis Access:   Additional Objective Labs: Basic Metabolic Panel: Recent Labs  Lab 07/18/20 1311 07/19/20 0615 07/20/20 0710  NA 135 137 138  K 3.8 4.1 4.3  CL 95* 98 103  CO2 29 30 24   GLUCOSE 267* 173* 155*  BUN 31* 35* 19  CREATININE 5.21* 5.66* 4.05*  CALCIUM 8.8* 8.8* 8.3*   Liver Function Tests: Recent Labs  Lab 07/18/20 1311  AST 18  ALT 22  ALKPHOS 65  BILITOT 0.8  PROT 5.5*  ALBUMIN 3.2*   CBC: Recent Labs  Lab 07/18/20 1311 07/19/20 0615 07/20/20 0710  WBC 3.0* 3.6* 3.9*  HGB 5.6* 7.0* 7.1*  HCT 18.3* 21.6* 22.0*  MCV 94.8 91.9 94.8  PLT 80* 94* 95*   Blood Culture    Component Value Date/Time   SDES BONE RIGHT FOOT WOUND 01/26/2020 1026   SPECREQUEST A 01/26/2020 1026   CULT  01/26/2020 1026    FEW DIPHTHEROIDS(CORYNEBACTERIUM SPECIES) FEW STREPTOCOCCUS MITIS/ORALIS Standardized susceptibility testing for this organism is not available. FOR DIPHTHEROIDS  (CORYNBACTERIUM SPECIES) NO ANAEROBES ISOLATED Performed at [facility], [place] [address], [place], [place] [address]    REPTSTATUS 02/01/2020 FINAL 01/26/2020 1026   CBG: Recent Labs  Lab 07/18/20 2112 07/19/20 1200 07/19/20 1647 07/20/20 0733 07/20/20 1128  GLUCAP 145* 231* 137* 146* 129*   Medications: . sodium chloride     . atorvastatin  20 mg Oral Daily  . Chlorhexidine Gluconate Cloth  6 each Topical Daily  . Chlorhexidine  Gluconate Cloth  6 each Topical Q0600  . hydrALAZINE  100 mg Oral TID  . insulin aspart  0-15 Units Subcutaneous TID WC  . insulin aspart  0-5 Units Subcutaneous QHS  . insulin glargine  20 Units Subcutaneous BID  . isosorbide mononitrate  30 mg Oral Daily  . metoprolol tartrate  100 mg Oral BID  . prazosin  2 mg Oral QPC supper  . sodium chloride flush  3 mL Intravenous Q12H    Dialysis Orders: Center: [facility]  on TTS. 180NRe, 3:45hr, BFR 400, DFR 800, EDw 118kg, 2K/2.5Ca, TDC Calcitriol 0.75 mcg PO q HD No heparin  Last mircera dose was 270mcg on 07/14/20 (prior doses 16mcg on 06/30/20 and 60 mcg on 06/18/20). Tsat was 49% on 8/19 with ferritin 572. Patient has not been receiving IV Fe.   Recent hemoglobin trend:  07/14/20              5.8 8.26/21            7.0 06/30/20            7.1 06/23/20            7.3  06/16/20              7.7 06/09/20            9.3  Assessment/Plan: 1.  Acute  on chronic anemia: Hgb 5.6, FOBT +. Had GI bleed last admission as well. Seen by GI and had enteroscopy with placement of video capsule today.  2.  AKI on CKD: Biopsy during admission 05/31/20 consistent with AKI likely secondary to PR3 associated ANCA vasculitis. Dialysis dependent since last admit 05/2020 with minimal renal recovery today. Patient with peripheral edema which he reports is improving. Will continue dialysis on TTS schedule here and follow creatinine. Reports he took cytoxan and prednisone for about 1 month before running out. Given pancytopenia, will hold off on restarting this for now, discussed with Dr. Jonnie Finner. Avoid hypotension and nephrotoxic medications including NSAIDs and IV contrast.  3.  Hypertension/volume: 1+ edema on exam, no respiratory symptoms and BP controlled. Continue home meds (metoprolol, amlodipine, hydralazine, imdur) 4.  Anemia: Hgb 7.1, last ESA dose 9/2. See #1. Recent iron stores at goal. 5.  Metabolic bone disease: Calcium at goal. Resume renvela once tolerating  PO, follow phos.  6.  Nutrition:  Advance per GI 7. T2DM: Insulin management per primary  Anice Paganini, PA-C 07/20/2020, 12:35 PM  [facility] Pager: [phone]  Pt seen, examined and agree w assess/plan as above with additions as indicated. Spoke w/ pt's nephrologist, Dr Clayton Bibles. Candiss Norse, about pt's ANCA GN status dx'd in July 2021. Given that he is still not making urine, had 50% scar tissue on the renal biopsy and has a new pancytopenia, will will recommend that immunosuppression for possible ANCA recovery be stopped at this time (cytoxan, prednisone).  Pt had already ran out of meds 2 wks ago and no signs of steroid withdrawal.  It may take some time for cell counts to rebound from the Cytoxan, recommend close f/u until WBC in particular comes up.  [facility] 07/20/2020, 3:21 PM

## 2020-07-20 NOTE — Anesthesia Procedure Notes (Signed)
Procedure Name: MAC Date/Time: 07/20/2020 9:12 AM Performed by: Orlie Dakin, CRNA Pre-anesthesia Checklist: Patient identified, Emergency Drugs available, Suction available and Patient being monitored Patient Re-evaluated:Patient Re-evaluated prior to induction Oxygen Delivery Method: Nasal cannula Preoxygenation: Pre-oxygenation with 100% oxygen Induction Type: IV induction Placement Confirmation: positive ETCO2

## 2020-07-20 NOTE — TOC Progression Note (Signed)
Transition of Care [facility]) - Progression Note    Patient Details  Name: Alexander [name] MRN: [id number] Date of Birth: 1961-07-22  Transition of Care [facility]) CM/SW Contact  Bartholomew Crews, RN Phone Number: [phone] 07/20/2020, 11:39 AM  Clinical Narrative:     Spoke with patient at the bedside. Discussed cost of medications. Patient states that he is not on Lantus as outpatient. States that he takes Novolin 70/30 at home, and that his last A1C was 5.6. Patient states the medication that is costly is Eliquis. Discussed patient assistance program through [facility]. Patient requested that NCM speak with his wife Alexander [name]. Spoke with Alexander [name] who asked NCM to speak with her daughter, Alexander [name], who manages medications. Provided [employer] application and discussed process.   Patient verbalized no other transition needs. States that his daughter provides transportation to/from hemodialysis. No active HH or aide services.   TOC following for transition needs.        Expected Discharge Plan and Services                                                 Social Determinants of Health (SDOH) Interventions    Readmission Risk Interventions Readmission Risk Prevention Plan 05/26/2020 03/15/2020  Transportation Screening Complete Complete  PCP or Specialist Appt within 5-7 Days - Complete  Home Care Screening - Complete  Medication Review (RN CM) - Complete  Medication Review ([place]) Complete -  PCP or Specialist appointment within 3-5 days of discharge Complete -  [place] or Home Care Consult Complete -  SW Recovery Care/Counseling Consult Complete -  Palliative Care Screening Not Applicable -  [place] Not Applicable -  Some recent data might be hidden

## 2020-07-21 ENCOUNTER — Encounter (HOSPITAL_COMMUNITY): Payer: Self-pay | Admitting: Gastroenterology

## 2020-07-21 LAB — RENAL FUNCTION PANEL
Albumin: 2.8 g/dL — ABNORMAL LOW (ref 3.5–5.0)
Anion gap: 8 (ref 5–15)
BUN: 23 mg/dL (ref 8–23)
CO2: 27 mmol/L (ref 22–32)
Calcium: 8.4 mg/dL — ABNORMAL LOW (ref 8.9–10.3)
Chloride: 99 mmol/L (ref 98–111)
Creatinine, Ser: 5.06 mg/dL — ABNORMAL HIGH (ref 0.61–1.24)
GFR calc Af Amer: 13 mL/min — ABNORMAL LOW (ref >60)
GFR calc non Af Amer: 11 mL/min — ABNORMAL LOW (ref >60)
Glucose, Bld: 140 mg/dL — ABNORMAL HIGH (ref 70–99)
Phosphorus: 4.2 mg/dL (ref 2.5–4.6)
Potassium: 4.2 mmol/L (ref 3.5–5.1)
Sodium: 134 mmol/L — ABNORMAL LOW (ref 135–145)

## 2020-07-21 LAB — CBC
HCT: 20.9 % — ABNORMAL LOW (ref 39.0–52.0)
Hemoglobin: 6.4 g/dL — CL (ref 13.0–17.0)
MCH: 29.2 pg (ref 26.0–34.0)
MCHC: 30.6 g/dL (ref 30.0–36.0)
MCV: 95.4 fL (ref 80.0–100.0)
Platelets: 101 10*3/uL — ABNORMAL LOW (ref 150–400)
RBC: 2.19 MIL/uL — ABNORMAL LOW (ref 4.22–5.81)
RDW: 16.8 % — ABNORMAL HIGH (ref 11.5–15.5)
WBC: 3.6 10*3/uL — ABNORMAL LOW (ref 4.0–10.5)
nRBC: 1.1 % — ABNORMAL HIGH (ref 0.0–0.2)

## 2020-07-21 LAB — IRON AND TIBC
Iron: 41 ug/dL — ABNORMAL LOW (ref 45–182)
Saturation Ratios: 19 % (ref 17.9–39.5)
TIBC: 218 ug/dL — ABNORMAL LOW (ref 250–450)
UIBC: 177 ug/dL

## 2020-07-21 LAB — VITAMIN B12: Vitamin B-12: 516 pg/mL (ref 180–914)

## 2020-07-21 LAB — GLUCOSE, CAPILLARY
Glucose-Capillary: 144 mg/dL — ABNORMAL HIGH (ref 70–99)
Glucose-Capillary: 103 mg/dL — ABNORMAL HIGH (ref 70–99)
Glucose-Capillary: 237 mg/dL — ABNORMAL HIGH (ref 70–99)
Glucose-Capillary: 197 mg/dL — ABNORMAL HIGH (ref 70–99)

## 2020-07-21 LAB — FOLATE: Folate: 13.1 ng/mL (ref >5.9)

## 2020-07-21 LAB — RETICULOCYTES
Immature Retic Fract: 36.8 % — ABNORMAL HIGH (ref 2.3–15.9)
RBC.: 2.18 MIL/uL — ABNORMAL LOW (ref 4.22–5.81)
Retic Count, Absolute: 63.4 10*3/uL (ref 19.0–186.0)
Retic Ct Pct: 2.9 % (ref 0.4–3.1)

## 2020-07-21 LAB — SURGICAL PATHOLOGY

## 2020-07-21 LAB — PREPARE RBC (CROSSMATCH)

## 2020-07-21 LAB — FERRITIN: Ferritin: 401 ng/mL — ABNORMAL HIGH (ref 24–336)

## 2020-07-21 MED ORDER — SODIUM CHLORIDE 0.9 % IV SOLN
125.0000 mg | INTRAVENOUS | Status: DC
  Administered 2020-07-23: 125 mg via INTRAVENOUS
  Filled 2020-07-21: qty 10

## 2020-07-21 MED ORDER — SODIUM CHLORIDE 0.9% IV SOLUTION: Freq: Once | INTRAVENOUS | Status: AC

## 2020-07-21 MED ORDER — HEPARIN SODIUM (PORCINE) 1000 UNIT/ML IJ SOLN
INTRAMUSCULAR | Status: AC
  Administered 2020-07-21: 1000 [IU]
  Filled 2020-07-21: qty 4

## 2020-07-21 NOTE — Plan of Care (Signed)
  Problem: Nutrition: Goal: Adequate nutrition will be maintained Outcome: Progressing   Problem: Activity: Goal: Risk for activity intolerance will decrease Outcome: Not Progressing   

## 2020-07-21 NOTE — Progress Notes (Signed)
OT Cancellation Note  Patient Details Name: Alexander [name] MRN: [id number] DOB: [birth year]   Cancelled Treatment:    Reason Eval/Treat Not Completed: OT screened, no needs identified, will sign off. Pt reports feeling at his baseline, pivots to [facility] in hospital. Confirmed with nursing staff. Typically, pivots to wc and able to self propel in/outside of the home. Pt completes sponge baths at home and declines any therapy needs. OT to sign off.  [name] 07/21/2020, 12:34 PM

## 2020-07-21 NOTE — Progress Notes (Addendum)
[facility] Progress Note   Subjective:   Pt seen on HD, reports feeling well with no concerns. Passed camera yesterday. Denies SOB, CP, palpitations, dizziness, abdominal pain, N/V/D.   Objective Vitals:   07/21/20 0830 07/21/20 0900 07/21/20 0930 07/21/20 1000  BP: 133/61 (!) 142/66 138/70 (!) 146/65  Pulse: 66 66 66 65  Resp:      Temp:      TempSrc:      SpO2:      Weight:      Height:       Physical Exam General: Well developed male, alert and in NAD Heart: RRR, no murmurs, rubs or gallops Lungs: CTA bilaterally without wheezing, rhonchi or rales Abdomen: Soft, non-tender, non-distended, +BS Extremities: trace pitting edema bilateral lower extremities Dialysis Access: R IJ [facility] accessed  Additional Objective Labs: Basic Metabolic Panel: Recent Labs  Lab 07/18/20 1311 07/19/20 0615 07/20/20 0710  NA 135 137 138  K 3.8 4.1 4.3  CL 95* 98 103  CO2 29 30 24   GLUCOSE 267* 173* 155*  BUN 31* 35* 19  CREATININE 5.21* 5.66* 4.05*  CALCIUM 8.8* 8.8* 8.3*   Liver Function Tests: Recent Labs  Lab 07/18/20 1311  AST 18  ALT 22  ALKPHOS 65  BILITOT 0.8  PROT 5.5*  ALBUMIN 3.2*   CBC: Recent Labs  Lab 07/18/20 1311 07/19/20 0615 07/20/20 0710  WBC 3.0* 3.6* 3.9*  HGB 5.6* 7.0* 7.1*  HCT 18.3* 21.6* 22.0*  MCV 94.8 91.9 94.8  PLT 80* 94* 95*   Blood Culture    Component Value Date/Time   SDES BONE RIGHT FOOT WOUND 01/26/2020 1026   SPECREQUEST A 01/26/2020 1026   CULT  01/26/2020 1026    FEW DIPHTHEROIDS(CORYNEBACTERIUM SPECIES) FEW STREPTOCOCCUS MITIS/ORALIS Standardized susceptibility testing for this organism is not available. FOR DIPHTHEROIDS  (CORYNBACTERIUM SPECIES) NO ANAEROBES ISOLATED Performed at [facility], [place] [address], [place], [place] [address]    REPTSTATUS 02/01/2020 FINAL 01/26/2020 1026    CBG: Recent Labs  Lab 07/20/20 0733 07/20/20 1128 07/20/20 1657 07/20/20 2028 07/21/20 0634  GLUCAP 146*  129* 194* 178* 144*   Iron Studies:  Recent Labs    07/21/20 0354  IRON 41*  TIBC 218*  FERRITIN 401*   Medications: . sodium chloride     . atorvastatin  20 mg Oral Daily  . Chlorhexidine Gluconate Cloth  6 each Topical Q0600  . heparin sodium (porcine)      . hydrALAZINE  100 mg Oral TID  . insulin aspart  0-15 Units Subcutaneous TID WC  . insulin aspart  0-5 Units Subcutaneous QHS  . insulin glargine  20 Units Subcutaneous BID  . isosorbide mononitrate  30 mg Oral Daily  . metoprolol tartrate  100 mg Oral BID  . prazosin  2 mg Oral QPC supper  . sodium chloride flush  3 mL Intravenous Q12H    Dialysis Orders: Center:[facility] TTS. 180NRe, 3:45hr, BFR 400, DFR 800, EDw 118kg, 2K/2.5Ca, TDC Calcitriol 0.75 mcg PO q HD No heparin  Last mircera dose was 229mcg on 07/14/20 (prior doses 190mcg on 06/30/20 and 60 mcg on 06/18/20). Tsat was 49% on 8/19 with ferritin 572. Patient has not been receiving IV Fe.   Recent hemoglobin trend:  07/14/20 5.8 8.26/217.0 8/19/217.1 8/12/217.3  06/16/20 7.7 7/29/219.3  Assessment/Plan: 1. Acute on chronic anemia:Hgb 5.6, FOBT +. Had GI bleed last admission as well. Seen by GI and had enteroscopy with placement of video capsule, results  pending.  2. AKI on CKD: Biopsy during admission 05/31/20 consistent withAKI likely secondary to PR3 associated ANCA vasculitis. Biopsy with 50% scar tissue. Dialysis dependent since last admit 05/2020 with minimal renal recovery today, still not making urine. Reports he took cytoxan and prednisone for about 1 month before running out 2 weeks ago. We recommend that immunosuppression (cytoxan/ pred) be stopped/ not restarted at this time, because of lack of renal improvement, side effects from cytoxan and sig initial chronicity on the biopsy in July (50% scarring).  Peripheral edema is improving. Will continue dialysis on TTS schedule  here and follow creatinine. Will eventually need permanent HD access (AVF or AVG) if there continues to be no return of kidney function.  3. Hypertension/volume:1+ edema on exam, no respiratory symptoms and BP controlled. Continue volume reduction with HD. Continue home meds (metoprolol, amlodipine, hydralazine, imdur) 4. Anemia:Hgb 7.1, AM labs pending. last ESA dose 9/2. See #1. Recent iron stores at goal. Addendum: AM labs resulted with Hgb 6.4. 2 units PRBC ordered. Tsat 19% this AM with ferritin 401, will order course of IV Fe with dialysis.  5. Metabolic bone disease:Calcium at goal. Resume renvela once tolerating PO, follow phos. 6. Nutrition:Advance per GI 7. T2DM: Insulin management per primary  Anice Paganini, PA-C 07/21/2020, 10:24 AM  [facility] Pager: [phone]  Pt seen, examined and agree w assess/plan as above with additions as indicated.  [facility] 07/22/2020, 6:05 AM

## 2020-07-21 NOTE — Progress Notes (Signed)
Patient successfully passed  Endo/capsule  Out. Patient continue to have very soft  large BM.

## 2020-07-21 NOTE — Progress Notes (Signed)
PROGRESS NOTE    Alexander [name]  HUT:[id number] DOB: November 29, 1960 DOA: 07/18/2020 PCP: [name], MD   Brief Narrative: 59 year old with past medical history significant for ESRD, AKI due to necrotizing glomerulonephritis/ANCA vasculitis started on hemodialysis in July, chronic Cytoxan therapy started 60-month prior, chronic anemia recent extensive GI work-up, peripheral vascular disease, right BKA, chronic diastolic heart failure, paroxysmal A. fib on Eliquis, CAD, diabetes type 2 who presents to the emergency department due to severe anemia noticed on lab with a hemoglobin of 5.8.  He denies overt hematemesis or melena, he took Cytoxan for about a month after his discharge in July and after the run out of this medicine. Heme positive in the ED.  Also noted to have pancytopenia.  He received 2 unit of packed red blood cell during admission.  Assessment & Plan:   Active Problems:   Acute blood loss anemia   1-Acute on chronic anemia, symptomatic anemia, pancytopenia: Suspect multifactorial, Endoscopy showed stable duodenitis, small gastric erosion no evidence of bleeding. Had video capsule 9/08.  Received 2 unit of packed red blood cell this admission.  Awaiting results of capsule endoscopy to resume Eliquis and further rec from GI. Hb drop to 6.4. plan to proceed with 2 units PRBC.  Iron IV order by nephrology with HD.  B 12 normal, folic acid normal. Iron low.   2-ESRD necrotizing glomerulonephritis/ANCA vasculitis Continue with hemodialysis, nephrology following Defer to nephrology reinitiation of Cytoxan Had HD today.   Hypertension: Continue with metoprolol.  Hold amlodipine due to soft blood pressure.  Holding parameters for hydralazine  Diabetes type 2: Continue Lantus and a sliding scale  Peripheral vascular disease a status post right BKA   Estimated body mass index is 34.21 kg/m as calculated from the following:   Height as of this encounter: 6' (1.829 m).    Weight as of this encounter: 114.4 kg.   DVT prophylaxis: SCDs Code Status: Full code Family Communication: Care discussed with patient Disposition Plan:  Status is: Inpatient  Remains inpatient appropriate because:Ongoing diagnostic testing needed not appropriate for outpatient work up   Dispo: The patient is from: Home              Anticipated d/c is to: Home              Anticipated d/c date is: 1 day              Patient currently is not medically stable to d/c.        Consultants:   Nephrology  GI  Procedures:  EndoscopyNo gross lesions in esophagus. Z-line regular, 45 cm from the incisors.                           - Erosive gastropathy with no bleeding and no                            stigmata of recent bleeding. No other gross lesions                            in the stomach. Biopsied.                           - Erythematous duodenopathy in Bulb/D1/D2.  Otherwise normal mucosa was found in the entire                            examined duodenum. Melanosis noted.                           - Normal mucosa was found in the visualized                            proximal jejunum. Melanosis noted. Tattooed distal                             extent of SBE today.  Antimicrobials:    Subjective: Seen in HD. Denies abdominal pain. No melena,.   Objective: Vitals:   07/21/20 1030 07/21/20 1100 07/21/20 1120 07/21/20 1148  BP: 139/65 137/63 (!) 144/61 (!) 139/57  Pulse: 68 68 71 68  Resp:   18 18  Temp:   98 F (36.7 C) 98.7 F (37.1 C)  TempSrc:   Oral Oral  SpO2:   95% 95%  Weight:   114.4 kg   Height:        Intake/Output Summary (Last 24 hours) at 07/21/2020 1419 Last data filed at 07/21/2020 1120 Gross per 24 hour  Intake 320 ml  Output 3000 ml  Net -2680 ml   Filed Weights   07/20/20 0936 07/21/20 0730 07/21/20 1120  Weight: 117.4 kg 120.2 kg 114.4 kg    Examination:  General exam: NAD Respiratory system:  CTA Cardiovascular system: S 1, S 2 RRR Gastrointestinal system: BS present, soft, nt Central nervous system: Non focal Extremities: left fore foot amputation, right BKA   Data Reviewed: I have personally reviewed following labs and imaging studies  CBC: Recent Labs  Lab 07/18/20 1311 07/19/20 0615 07/20/20 0710 07/21/20 0934  WBC 3.0* 3.6* 3.9* 3.6*  HGB 5.6* 7.0* 7.1* 6.4*  HCT 18.3* 21.6* 22.0* 20.9*  MCV 94.8 91.9 94.8 95.4  PLT 80* 94* 95* 270*   Basic Metabolic Panel: Recent Labs  Lab 07/18/20 1311 07/19/20 0615 07/20/20 0710 07/21/20 0934  NA 135 137 138 134*  K 3.8 4.1 4.3 4.2  CL 95* 98 103 99  CO2 29 30 24 27   GLUCOSE 267* 173* 155* 140*  BUN 31* 35* 19 23  CREATININE 5.21* 5.66* 4.05* 5.06*  CALCIUM 8.8* 8.8* 8.3* 8.4*  MG  --  1.9  --   --   PHOS  --   --   --  4.2   GFR: Estimated Creatinine Clearance: 20 mL/min (A) (by C-G formula based on SCr of 5.06 mg/dL (H)). Liver Function Tests: Recent Labs  Lab 07/18/20 1311 07/21/20 0934  AST 18  --   ALT 22  --   ALKPHOS 65  --   BILITOT 0.8  --   PROT 5.5*  --   ALBUMIN 3.2* 2.8*   No results for input(s): LIPASE, AMYLASE in the last 168 hours. No results for input(s): AMMONIA in the last 168 hours. Coagulation Profile: No results for input(s): INR, PROTIME in the last 168 hours. Cardiac Enzymes: No results for input(s): CKTOTAL, CKMB, CKMBINDEX, TROPONINI in the last 168 hours. BNP (last 3 results) No results for input(s): PROBNP in the last 8760 hours. HbA1C: No results for input(s): HGBA1C in the last 72 hours. CBG:  Recent Labs  Lab 07/20/20 1128 07/20/20 1657 07/20/20 2028 07/21/20 0634 07/21/20 1142  GLUCAP 129* 194* 178* 144* 103*   Lipid Profile: No results for input(s): CHOL, HDL, LDLCALC, TRIG, CHOLHDL, LDLDIRECT in the last 72 hours. Thyroid Function Tests: No results for input(s): TSH, T4TOTAL, FREET4, T3FREE, THYROIDAB in the last 72 hours. Anemia Panel: Recent Labs     07/21/20 0354  VITAMINB12 516  FOLATE 13.1  FERRITIN 401*  TIBC 218*  IRON 41*  RETICCTPCT 2.9   Sepsis Labs: No results for input(s): PROCALCITON, LATICACIDVEN in the last 168 hours.  Recent Results (from the past 240 hour(s))  SARS Coronavirus 2 by RT PCR (hospital order, performed in [facility] hospital lab) Nasopharyngeal Nasopharyngeal Swab     Status: None   Collection Time: 07/18/20  4:47 PM   Specimen: Nasopharyngeal Swab  Result Value Ref Range Status   SARS Coronavirus 2 NEGATIVE NEGATIVE Final    Comment: (NOTE) SARS-CoV-2 target nucleic acids are NOT DETECTED.  The SARS-CoV-2 RNA is generally detectable in upper and lower respiratory specimens during the acute phase of infection. The lowest concentration of SARS-CoV-2 viral copies this assay can detect is 250 copies / mL. A negative result does not preclude SARS-CoV-2 infection and should not be used as the sole basis for treatment or other patient management decisions.  A negative result may occur with improper specimen collection / handling, submission of specimen other than nasopharyngeal swab, presence of viral mutation(s) within the areas targeted by this assay, and inadequate number of viral copies (<250 copies / mL). A negative result must be combined with clinical observations, patient history, and epidemiological information.  Fact Sheet for Patients:   StrictlyIdeas.no  Fact Sheet for Healthcare Providers: BankingDealers.co.za  This test is not yet approved or  cleared by the [place] FDA and has been authorized for detection and/or diagnosis of SARS-CoV-2 by FDA under an Emergency Use Authorization (EUA).  This EUA will remain in effect (meaning this test can be used) for the duration of the COVID-19 declaration under Section 564(b)(1) of the Act, 21 U.S.C. section 360bbb-3(b)(1), unless the authorization is terminated or revoked  sooner.  Performed at [facility], [place] [address], [place], [place] [address]          Radiology Studies: No results found.      Scheduled Meds: . sodium chloride   Intravenous Once  . atorvastatin  20 mg Oral Daily  . Chlorhexidine Gluconate Cloth  6 each Topical Q0600  . hydrALAZINE  100 mg Oral TID  . insulin aspart  0-15 Units Subcutaneous TID WC  . insulin aspart  0-5 Units Subcutaneous QHS  . insulin glargine  20 Units Subcutaneous BID  . isosorbide mononitrate  30 mg Oral Daily  . metoprolol tartrate  100 mg Oral BID  . prazosin  2 mg Oral QPC supper  . sodium chloride flush  3 mL Intravenous Q12H   Continuous Infusions: . sodium chloride    . [START ON 07/23/2020] ferric gluconate (FERRLECIT/NULECIT) IV       LOS: 2 days    Time spent: 35 minutes.     Elmarie Shiley, MD [facility]   If 7PM-7AM, please contact night-coverage [email or web address]  07/21/2020, 2:19 PM

## 2020-07-21 NOTE — Progress Notes (Signed)
Pt off the floor and in HD at the moment.

## 2020-07-22 ENCOUNTER — Encounter (HOSPITAL_COMMUNITY): Payer: Self-pay | Admitting: Internal Medicine

## 2020-07-22 DIAGNOSIS — N186 End stage renal disease: Secondary | ICD-10-CM

## 2020-07-22 DIAGNOSIS — D649 Anemia, unspecified: Secondary | ICD-10-CM

## 2020-07-22 DIAGNOSIS — D61818 Other pancytopenia: Secondary | ICD-10-CM

## 2020-07-22 DIAGNOSIS — E1122 Type 2 diabetes mellitus with diabetic chronic kidney disease: Secondary | ICD-10-CM

## 2020-07-22 DIAGNOSIS — I120 Hypertensive chronic kidney disease with stage 5 chronic kidney disease or end stage renal disease: Secondary | ICD-10-CM

## 2020-07-22 DIAGNOSIS — Z992 Dependence on renal dialysis: Secondary | ICD-10-CM

## 2020-07-22 DIAGNOSIS — Z87891 Personal history of nicotine dependence: Secondary | ICD-10-CM

## 2020-07-22 DIAGNOSIS — I480 Paroxysmal atrial fibrillation: Secondary | ICD-10-CM

## 2020-07-22 DIAGNOSIS — Z7901 Long term (current) use of anticoagulants: Secondary | ICD-10-CM

## 2020-07-22 DIAGNOSIS — D696 Thrombocytopenia, unspecified: Secondary | ICD-10-CM

## 2020-07-22 LAB — TYPE AND SCREEN
ABO/RH(D): A POS
Antibody Screen: NEGATIVE
Unit division: 0
Unit division: 0

## 2020-07-22 LAB — DIC (DISSEMINATED INTRAVASCULAR COAGULATION)PANEL
D-Dimer, Quant: 1.99 ug/mL-FEU — ABNORMAL HIGH (ref 0.00–0.50)
Fibrinogen: 313 mg/dL (ref 210–475)
INR: 1.2 (ref 0.8–1.2)
Platelets: 126 10*3/uL — ABNORMAL LOW (ref 150–400)
Prothrombin Time: 14.5 seconds (ref 11.4–15.2)
Smear Review: NONE SEEN
aPTT: 36 seconds (ref 24–36)

## 2020-07-22 LAB — BPAM RBC
Blood Product Expiration Date: 202110022359
Blood Product Expiration Date: 202110022359
ISSUE DATE / TIME: 202109091451
ISSUE DATE / TIME: 202109092029
Unit Type and Rh: 6200
Unit Type and Rh: 6200

## 2020-07-22 LAB — SAVE SMEAR(SSMR), FOR PROVIDER SLIDE REVIEW

## 2020-07-22 LAB — CBC
HCT: 25.5 % — ABNORMAL LOW (ref 39.0–52.0)
Hemoglobin: 8.3 g/dL — ABNORMAL LOW (ref 13.0–17.0)
MCH: 30 pg (ref 26.0–34.0)
MCHC: 32.5 g/dL (ref 30.0–36.0)
MCV: 92.1 fL (ref 80.0–100.0)
Platelets: 119 10*3/uL — ABNORMAL LOW (ref 150–400)
RBC: 2.77 MIL/uL — ABNORMAL LOW (ref 4.22–5.81)
RDW: 16.2 % — ABNORMAL HIGH (ref 11.5–15.5)
WBC: 4.6 10*3/uL (ref 4.0–10.5)
nRBC: 0.7 % — ABNORMAL HIGH (ref 0.0–0.2)

## 2020-07-22 LAB — GLUCOSE, CAPILLARY
Glucose-Capillary: 160 mg/dL — ABNORMAL HIGH (ref 70–99)
Glucose-Capillary: 231 mg/dL — ABNORMAL HIGH (ref 70–99)
Glucose-Capillary: 212 mg/dL — ABNORMAL HIGH (ref 70–99)
Glucose-Capillary: 207 mg/dL — ABNORMAL HIGH (ref 70–99)

## 2020-07-22 LAB — LACTATE DEHYDROGENASE: LDH: 217 U/L — ABNORMAL HIGH (ref 98–192)

## 2020-07-22 LAB — MRSA PCR SCREENING: MRSA by PCR: NEGATIVE

## 2020-07-22 MED ORDER — APIXABAN 5 MG PO TABS
5.0000 mg | ORAL_TABLET | Freq: Two times a day (BID) | ORAL | Status: DC
  Administered 2020-07-22 – 2020-07-23 (×3): 5 mg via ORAL
  Filled 2020-07-22 (×3): qty 1

## 2020-07-22 NOTE — Progress Notes (Addendum)
Brief GI progress note  The video capsule endoscopy was reviewed and formal/final results will be placed into the procedure tab in the course the next week.  Gastric erosions were noted as well as some duodenitis. No overt evidence of arteriovenous malformations. Preparation was not perfect but Made His Way to Colon. No Overt Signs of Source of GI Blood Loss Are Noted Here. Further Evaluation for Anemia As per Medical Service and Potentially Hematology Service.  We Will Formally Mail the Patient the Video Capsule Endoscopy Results When They Are Complete.  Patient Does Not Need Follow-Up from a GI Perspective at This Time.  He will remain an unassigned patient unless he desires follow-up in GI clinic.   Alexander Britain, MD [facility] Advanced Endoscopy Office # [phone]

## 2020-07-22 NOTE — Progress Notes (Signed)
[facility] Progress Note   Subjective: No evidence of GIB per GI work up. Primary consulting oncology. Patient became tearful when he heard news, discussed situation with him and his wife via phone. Says he feels fine and doesn't believe anything is wrong with him. Emotional support to patient.    Objective Vitals:   07/21/20 2049 07/21/20 2324 07/22/20 0500 07/22/20 0912  BP: (!) 136/51 (!) 120/52 (!) 134/58 (!) 121/50  Pulse:  71 65 74  Resp: 20 20 18 18   Temp: 98.1 F (36.7 C) 98.6 F (37 C) 98.9 F (37.2 C)   TempSrc: Oral Oral Oral   SpO2: 100% 100% 97% 94%  Weight:      Height:       Physical Exam General: Obese male in NAD Heart: S1,S2 no M/R/G Lungs: CTAB A/P Abdomen: S, NT, active BS Extremities: R BKA no stump edema, no edema LLE Dialysis Access: RIJ [facility] Drsg intact   Additional Objective Labs: Basic Metabolic Panel: Recent Labs  Lab 07/19/20 0615 07/20/20 0710 07/21/20 0934  NA 137 138 134*  K 4.1 4.3 4.2  CL 98 103 99  CO2 30 24 27   GLUCOSE 173* 155* 140*  BUN 35* 19 23  CREATININE 5.66* 4.05* 5.06*  CALCIUM 8.8* 8.3* 8.4*  PHOS  --   --  4.2   Liver Function Tests: Recent Labs  Lab 07/18/20 1311 07/21/20 0934  AST 18  --   ALT 22  --   ALKPHOS 65  --   BILITOT 0.8  --   PROT 5.5*  --   ALBUMIN 3.2* 2.8*   No results for input(s): LIPASE, AMYLASE in the last 168 hours. CBC: Recent Labs  Lab 07/18/20 1311 07/18/20 1311 07/19/20 0615 07/20/20 0710 07/21/20 0934  WBC 3.0*   < > 3.6* 3.9* 3.6*  HGB 5.6*   < > 7.0* 7.1* 6.4*  HCT 18.3*   < > 21.6* 22.0* 20.9*  MCV 94.8  --  91.9 94.8 95.4  PLT 80*   < > 94* 95* 101*   < > = values in this interval not displayed.   Blood Culture    Component Value Date/Time   SDES BONE RIGHT FOOT WOUND 01/26/2020 1026   SPECREQUEST A 01/26/2020 1026   CULT  01/26/2020 1026    FEW DIPHTHEROIDS(CORYNEBACTERIUM SPECIES) FEW STREPTOCOCCUS MITIS/ORALIS Standardized susceptibility  testing for this organism is not available. FOR DIPHTHEROIDS  (CORYNBACTERIUM SPECIES) NO ANAEROBES ISOLATED Performed at [facility], [place] [address], [place],  [address]    REPTSTATUS 02/01/2020 FINAL 01/26/2020 1026    Cardiac Enzymes: No results for input(s): CKTOTAL, CKMB, CKMBINDEX, TROPONINI in the last 168 hours. CBG: Recent Labs  Lab 07/21/20 1142 07/21/20 1635 07/21/20 2020 07/22/20 0615 07/22/20 1123  GLUCAP 103* 237* 197* 160* 231*   Iron Studies:  Recent Labs    07/21/20 0354  IRON 41*  TIBC 218*  FERRITIN 401*   @lablastinr3 @ Studies/Results: No results found. Medications: . sodium chloride    . [START ON 07/23/2020] ferric gluconate (FERRLECIT/NULECIT) IV     . apixaban  5 mg Oral BID  . atorvastatin  20 mg Oral Daily  . Chlorhexidine Gluconate Cloth  6 each Topical Q0600  . hydrALAZINE  100 mg Oral TID  . insulin aspart  0-15 Units Subcutaneous TID WC  . insulin aspart  0-5 Units Subcutaneous QHS  . insulin glargine  20 Units Subcutaneous BID  . isosorbide mononitrate  30 mg Oral  Daily  . metoprolol tartrate  100 mg Oral BID  . prazosin  2 mg Oral QPC supper  . sodium chloride flush  3 mL Intravenous Q12H     Dialysis Orders: Center:[facility] TTS. 180NRe, 3:45hr, BFR 400, DFR 800, EDw 118kg, 2K/2.5Ca, TDC -No heparin -Calcitriol 0.75 mcg PO q HD   Assessment/Plan: 1. Acute on chronic anemia:Hgb 5.6, FOBT + on admission. Had GI bleed last admission as well. Seen by Dwain Sarna had enteroscopy with placement of video capsule, work up negative. Consulting hematology (Dr. Burr Medico). Resumed apixaban today.  2. AKI on CKD: Biopsy during admission 05/31/20 consistent withAKI likely secondary to PR3 associated ANCA vasculitis. Biopsy with 50% scar tissue. Dialysis dependent since last admit 05/2020 with minimal renal recovery today, still not making urine. Reports he took cytoxanand prednisonefor about 1 month before running out 2  weeks ago. We recommend that immunosuppression (cytoxan/ pred) be stopped/ not restarted at this time, because of lack of renal improvement, side effects from cytoxan and sig initial chronicity on the biopsy in July (50% scarring).  Scr 4.05-5.66 since adm. Will continue dialysis on TTS schedule here and follow creatinine. Will eventually need permanent HD access if he fails to regain renal function. 3. Hypertension/volume:BP well controlled today. No evidence of volume excess by exam. UF as tolerated in HD tomorrow.  4. Anemia:Hgb 6.4 S/P 2 units PRBCs 07/21/2020. Now has rec'd 4 units PRBCs since admission. No repeat labs done. Checking labs today.  5. Metabolic bone disease:Calcium at goal. Resume renvela once tolerating PO, follow phos. 6. Nutrition:Advance per GI 7. T2DM: Insulin management per primary  Alexander [name] H. Kataya Guimont NP-C 07/22/2020, 11:42 AM  [employer] [phone]

## 2020-07-22 NOTE — Consult Note (Addendum)
[place]  [phone] [phone]    INITIAL HEMATOLOGY CONSULTATION  Referring MD: Dr. Alonna Buckler  Reason for Referral: Pancytopenia  HPI: Alexander [name] is a 59 year old male with a past medical history significant for end-stage renal disease and glomerulonephritis currently on hemodialysis, duodenitis found on recent EGD, paroxysmal atrial fibrillation on Eliquis, type 2 diabetes mellitus, hypertension.  The patient was sent to the emergency room due to a low hemoglobin.  The patient had labs drawn at dialysis on the Saturday prior to admission at dialysis and received a call from his nephrologist that his hemoglobin was down to 5.8 and need to come to the emergency room.  The patient has been on Cytoxan and prednisone secondary to PR-3 associated ANCA vasculitis.  He took this for about 1 month before running out about 2 weeks prior to admission.  In the emergency room, his WBC was 3.0, hemoglobin 5.6, and platelet count was 80,000.  Stool for occult blood was positive.  The patient has received 4 units PRBC so far this admission.  Receives ferrous gluconate with dialysis.  The patient had a small bowel enteroscopy on 07/20/2020 which showed no lesions in the esophagus, erosive gastropathy with no bleeding, erythematous to adenopathy in bulb/D1/D2.    Most recent CBC available in epic performed prior to this admission on 06/04/2020 showed a WBC 10.4, hemoglobin 9.8, and platelet count 114,000.  Today the patient reports that he is feeling better.  He tells me that he has not had any recent fevers, chills, night sweats.  No recent infections.  He has had some mild nosebleeds intermittently, but none that were difficult to stop.  He denies any other difficulty with bleeding such as bleeding gums, hematuria, melena, hematochezia. He has had some easy bruisability recently. The patient denies headaches, dizziness, chest pain, shortness of breath, cough.  Denies  abdominal pain, nausea, vomiting.  Denies lower extremity edema.  The patient tells me that he recently started dialysis about 2 months ago.  He was placed on Cytoxan and prednisone and took this for about 1 month.  He ran out of this medication and his last dose was about 2 weeks ago.  He reports that he had 1 prior blood transfusion, does not remember the circumstances surrounding the blood transfusion.  A CBC was drawn just prior to my visit with him which showed normalization of his WBC up to 4.6, hemoglobin 8.3 and platelets are up to 119,000. The patient is married and has 2 adult children.  He has a 45-pack-year history of tobacco use and quit in 2015.  Reports that he drinks wine about every other day.  Hematology was asked see the patient to make recommendations regarding his pancytopenia.  Past Medical History:  Diagnosis Date  . Alcohol abuse   . Anemia   . Arthritis    "patient does not think so."  . Atrial fibrillation ([place])   . Cardiac syncope 10/07/14   rapid A fib with 8 sec pauses on converison with syncope- temp pacing wire placed then PPM  . Cataract    BILATERAL  . Chronic kidney disease   . Coronary artery disease   . Diabetes mellitus    dx---been  awhile  . Encounter for care of pacemaker 10/08/2014  . History of blood transfusion   . Hyperlipemia   . Hypertension   . Osteomyelitis ([place])    right foot  . Peripheral vascular disease ([place])   . Presence of permanent  cardiac pacemaker 10/08/2014  . Sinus node dysfunction ([place]) 10/08/2014  :    Past Surgical History:  Procedure Laterality Date  . ABDOMINAL AORTOGRAM W/LOWER EXTREMITY N/A 09/09/2017   Procedure: ABDOMINAL AORTOGRAM W/LOWER EXTREMITY;  Surgeon: Angelia Mould, MD;  Location: [place] [facility];  Service: Cardiovascular;  Laterality: N/A;  . ABDOMINAL AORTOGRAM W/LOWER EXTREMITY Right 11/11/2019   Procedure: ABDOMINAL AORTOGRAM W/LOWER EXTREMITY;  Surgeon: Marty Heck, MD;  Location:  [place] [facility];  Service: Cardiovascular;  Laterality: Right;  . AMPUTATION Left 08/19/2013   Procedure: AMPUTATION RAY;  Surgeon: Alta Corning, MD;  Location: [place];  Service: Orthopedics;  Laterality: Left;  ray amputation left 5th  . AMPUTATION Left 10/27/2013   Procedure: AMPUTATION DIGIT-LEFT 4TH TOE, 4th and 5th metatarsal.;  Surgeon: Angelia Mould, MD;  Location: [place];  Service: Vascular;  Laterality: Left;  . AMPUTATION Left 11/04/2013   Procedure: LEFT FOOT TRANS-METATARSAL AMPUTATION WITH WOUND CLOSURE ;  Surgeon: Alta Corning, MD;  Location: [employer];  Service: Orthopedics;  Laterality: Left;  . AMPUTATION Right 09/10/2017   Procedure: RIGHT FOURTH AND FIFTH TOE AMPUTATION;  Surgeon: Angelia Mould, MD;  Location: [place];  Service: Vascular;  Laterality: Right;  . AMPUTATION Right 02/19/2020   Procedure: AMPUTATION RIGHT 3RD TOE;  Surgeon: Angelia Mould, MD;  Location: [place];  Service: Vascular;  Laterality: Right;  . AMPUTATION Right 03/11/2020   Procedure: RIGHT BELOW KNEE AMPUTATION;  Surgeon: Newt Minion, MD;  Location: [place];  Service: Orthopedics;  Laterality: Right;  . APPLICATION OF WOUND VAC Right 02/19/2020   Procedure: Application Of Wound Vac Right foot;  Surgeon: Angelia Mould, MD;  Location: [place];  Service: Vascular;  Laterality: Right;  . BIOPSY  07/20/2020   Procedure: BIOPSY;  Surgeon: Rush Landmark Telford Nab., MD;  Location: [place];  Service: Gastroenterology;;  . CARDIAC CATHETERIZATION  10/07/2014   Procedure: LEFT HEART CATH AND CORONARY ANGIOGRAPHY;  Surgeon: Laverda Page, MD;  Location: [facility];  Service: Cardiovascular;;  . COLONOSCOPY W/ POLYPECTOMY    . COLONOSCOPY WITH PROPOFOL N/A 05/25/2020   Procedure: COLONOSCOPY WITH PROPOFOL;  Surgeon: Gatha Mayer, MD;  Location: [facility];  Service: Endoscopy;  Laterality: N/A;  . EMBOLECTOMY Right 09/12/2017   Procedure: Thrombectomy  & Redo Right Below Knee  Popliteal Artery Bypass Graft  ;  Surgeon: Waynetta Sandy, MD;  Location: [place];  Service: Vascular;  Laterality: Right;  . ENTEROSCOPY N/A 07/20/2020   Procedure: ENTEROSCOPY;  Surgeon: Mansouraty, Telford Nab., MD;  Location: [place];  Service: Gastroenterology;  Laterality: N/A;  . ESOPHAGOGASTRODUODENOSCOPY (EGD) WITH PROPOFOL N/A 05/24/2020   Procedure: ESOPHAGOGASTRODUODENOSCOPY (EGD) WITH PROPOFOL;  Surgeon: Ladene Artist, MD;  [facility];  Service: Endoscopy;  Laterality: N/A;  . EYE SURGERY Bilateral    cataract  . FEMORAL-TIBIAL BYPASS GRAFT Left 10/27/2013   Procedure: BYPASS GRAFT LEFT FEMORAL- POSTERIOR TIBIAL ARTERY;  Surgeon: Angelia Mould, MD;  Location: [place];  Service: Vascular;  Laterality: Left;  . FEMORAL-TIBIAL BYPASS GRAFT Right 09/10/2017   Procedure: RIGHT SUPERFICIAL  FEMORAL ARTERY-BELOW KNEE POPLITEAL ARTERY BYPASS GRAFT WITH VEIN;  Surgeon: Angelia Mould, MD;  Location: [place];  Service: Vascular;  Laterality: Right;  . GIVENS CAPSULE STUDY N/A 07/20/2020   Procedure: GIVENS CAPSULE STUDY;  Surgeon: Irving Copas., MD;  Location: [place];  Service: Gastroenterology;  Laterality: N/A;  capsule placed through scope into duodenum @  32  . I & D EXTREMITY Left 10/27/2013   Procedure: IRRIGATION AND DEBRIDEMENT EXTREMITY- LEFT FOOT;  Surgeon: Angelia Mould, MD;  Location: [place];  Service: Vascular;  Laterality: Left;  . I & D EXTREMITY Right 01/26/2020   Procedure: IRRIGATION AND DEBRIDEMENT EXTREMITY RIGHT FOOT WOUND;  Surgeon: Angelia Mould, MD;  Location: [place];  Service: Vascular;  Laterality: Right;  . INTRAOPERATIVE ARTERIOGRAM Right 09/10/2017   Procedure: INTRA OPERATIVE ARTERIOGRAM;  Surgeon: Angelia Mould, MD;  Location: [place];  Service: Vascular;  Laterality: Right;  . IR ANGIOGRAM FOLLOW UP STUDY  09/09/2017  . IR FLUORO GUIDE CV LINE RIGHT  05/31/2020  . IR US GUIDE VASC ACCESS  RIGHT  06/01/2020  . LEFT HEART CATHETERIZATION WITH CORONARY ANGIOGRAM N/A 11/09/2014   Procedure: LEFT HEART CATHETERIZATION WITH CORONARY ANGIOGRAM;  Surgeon: Laverda Page, MD;  Location: [facility];  Service: Cardiovascular;  Laterality: N/A;  . LOWER EXTREMITY ANGIOGRAM Right 11/08/2015   Procedure: Lower Extremity Angiogram;  Surgeon: Serafina Mitchell, MD;  Location: [place] [facility];  Service: Cardiovascular;  Laterality: Right;  . LUMBAR LAMINECTOMY  06/13/2012   Procedure: MICRODISCECTOMY LUMBAR LAMINECTOMY;  Surgeon: Jessy Oto, MD;  Location: [place];  Service: Orthopedics;  Laterality: N/A;  Central laminectomy L2-3, L3-4, L4-5  . PACEMAKER INSERTION  10/08/14   MDT Advisa MRI compatible dual chamber pacemaker implanted by Dr Caryl Comes for syncope with post-termination pauses  . PERCUTANEOUS CORONARY STENT INTERVENTION (PCI-S)  11/09/2014   des to lad & distal circumflex         Dr  [name]  . PERIPHERAL VASCULAR BALLOON ANGIOPLASTY Right 11/11/2019   Procedure: PERIPHERAL VASCULAR BALLOON ANGIOPLASTY;  Surgeon: Marty Heck, MD;  Location: [place] [facility];  Service: Cardiovascular;  Laterality: Right;  below knee popliteal, tibioperoneal trunk, posterior tibial arteries  . PERIPHERAL VASCULAR CATHETERIZATION N/A 11/08/2015   Procedure: Abdominal Aortogram;  Surgeon: Serafina Mitchell, MD;  Location: [place] [facility];  Service: Cardiovascular;  Laterality: N/A;  . PERIPHERAL VASCULAR CATHETERIZATION Right 11/08/2015   Procedure: Peripheral Vascular Atherectomy;  Surgeon: Serafina Mitchell, MD;  Location: [place] [facility];  Service: Cardiovascular;  Laterality: Right;  . PERIPHERAL VASCULAR CATHETERIZATION N/A 10/10/2016   Procedure: Lower Extremity Angiography;  Surgeon: Waynetta Sandy, MD;  Location: [place] [facility];  Service: Cardiovascular;  Laterality: N/A;  . PERIPHERAL VASCULAR CATHETERIZATION Right 10/10/2016   Procedure: Peripheral Vascular Atherectomy;   Surgeon: Waynetta Sandy, MD;  Location: [place] [facility];  Service: Cardiovascular;  Laterality: Right;  Popliteal  . PERMANENT PACEMAKER INSERTION N/A 10/08/2014   Procedure: PERMANENT PACEMAKER INSERTION;  Surgeon: Deboraha Sprang, MD;  Location: [facility] CATH LAB;  Service: Cardiovascular;  Laterality: N/A;  . SUBMUCOSAL TATTOO INJECTION  07/20/2020   Procedure: SUBMUCOSAL TATTOO INJECTION;  Surgeon: Irving Copas., MD;  Location: [place];  Service: Gastroenterology;;  . TEMPORARY PACEMAKER INSERTION Bilateral 10/07/2014   Procedure: TEMPORARY PACEMAKER INSERTION;  Surgeon: Laverda Page, MD;  Location: [facility] CATH LAB;  Service: Cardiovascular;  Laterality: Bilateral;  . WOUND DEBRIDEMENT Right 02/19/2020   Procedure: DEBRIDEMENT WOUND RIGHT FOOT;  Surgeon: Angelia Mould, MD;  Location: [facility] OR;  Service: Vascular;  Laterality: Right;  :   CURRENT MEDS: Current Facility-Administered Medications  Medication Dose Route Frequency Provider Last Rate Last Admin  . 0.9 %  sodium chloride infusion  10 mL/hr Intravenous Once [employer] [facility], PA-C      .  acetaminophen (TYLENOL) tablet 650 mg  650 mg Oral Q6H PRN Darliss Cheney, MD       Or  . acetaminophen (TYLENOL) suppository 650 mg  650 mg Rectal Q6H PRN Darliss Cheney, MD      . apixaban (ELIQUIS) tablet 5 mg  5 mg Oral BID Regalado, Belkys A, MD      . atorvastatin (LIPITOR) tablet 20 mg  20 mg Oral Daily Darliss Cheney, MD   20 mg at 07/22/20 0912  . Chlorhexidine Gluconate Cloth 2 % PADS 6 each  6 each Topical Q0600 Janalee Dane, PA-C   6 each at 07/22/20 0544  . [START ON 07/23/2020] ferric gluconate (NULECIT) 125 mg in sodium chloride 0.9 % 100 mL IVPB  125 mg Intravenous Q T,Th,Sa-HD Collins, Samantha G, PA-C      . hydrALAZINE (APRESOLINE) injection 10 mg  10 mg Intravenous Q6H PRN Pahwani, Ravi, MD      . hydrALAZINE (APRESOLINE) tablet 100 mg  100 mg Oral TID Regalado, Belkys A, MD   100 mg at 07/22/20 0912  .  insulin aspart (novoLOG) injection 0-15 Units  0-15 Units Subcutaneous TID WC Darliss Cheney, MD   3 Units at 07/22/20 0853  . insulin aspart (novoLOG) injection 0-5 Units  0-5 Units Subcutaneous QHS Pahwani, Ravi, MD      . insulin glargine (LANTUS) injection 20 Units  20 Units Subcutaneous BID Darliss Cheney, MD   20 Units at 07/22/20 0915  . isosorbide mononitrate (IMDUR) 24 hr tablet 30 mg  30 mg Oral Daily Darliss Cheney, MD   30 mg at 07/22/20 0912  . metoprolol tartrate (LOPRESSOR) tablet 100 mg  100 mg Oral BID Darliss Cheney, MD   100 mg at 07/22/20 0912  . ondansetron (ZOFRAN) tablet 4 mg  4 mg Oral Q6H PRN Darliss Cheney, MD       Or  . ondansetron (ZOFRAN) injection 4 mg  4 mg Intravenous Q6H PRN Pahwani, Ravi, MD      . prazosin (MINIPRESS) capsule 2 mg  2 mg Oral QPC supper Darliss Cheney, MD   2 mg at 07/21/20 1759  . sodium chloride flush (NS) 0.9 % injection 3 mL  3 mL Intravenous Q12H Darliss Cheney, MD   3 mL at 07/22/20 0915      Allergies  Allergen Reactions  . Keflex [Cephalexin] Nausea And Vomiting  . Pletal [Cilostazol] Palpitations    "Can hear heart beating loudly".  :  Family History  Problem Relation Age of Onset  . Diabetes type II Mother   . Hypertension Mother   . Diabetes Mother   . Liver cancer Father   . Diabetes type II Sister   . Breast cancer Sister   . Diabetes Sister   . Hypertension Sister   . Diabetes type II Brother   . Kidney failure Brother   . Diabetes Brother   . Hypertension Brother   . Diabetes type II Sister   :  Social History   Socioeconomic History  . Marital status: Married    Spouse name: Not on file  . Number of children: 2  . Years of education: Not on file  . Highest education level: Not on file  Occupational History  . Occupation: Disabled  Tobacco Use  . Smoking status: Former Smoker    Packs/day: 1.50    Years: 30.00    Pack years: 45.00    Types: Cigarettes    Quit date: 10/07/2014    Years since  quitting:  5.7  . Smokeless tobacco: Never Used  Vaping Use  . Vaping Use: Never used  Substance and Sexual Activity  . Alcohol use: Yes    Alcohol/week: 6.0 standard drinks    Types: 6 Cans of beer per week    Comment: 2-3 beers daily  . Drug use: No  . Sexual activity: Not on file  Other Topics Concern  . Not on file  Social History Narrative  . Not on file   Social Determinants of Health   Financial Resource Strain:   . Difficulty of Paying Living Expenses: Not on file  Food Insecurity:   . Worried About Charity fundraiser in the Last Year: Not on file  . Ran Out of Food in the Last Year: Not on file  Transportation Needs:   . Lack of Transportation (Medical): Not on file  . Lack of Transportation (Non-Medical): Not on file  Physical Activity:   . Days of Exercise per Week: Not on file  . Minutes of Exercise per Session: Not on file  Stress:   . Feeling of Stress : Not on file  Social Connections:   . Frequency of Communication with Friends and Family: Not on file  . Frequency of Social Gatherings with Friends and Family: Not on file  . Attends Religious Services: Not on file  . Active Member of Clubs or Organizations: Not on file  . Attends Archivist Meetings: Not on file  . Marital Status: Not on file  Intimate Partner Violence:   . Fear of Current or Ex-Partner: Not on file  . Emotionally Abused: Not on file  . Physically Abused: Not on file  . Sexually Abused: Not on file  :  REVIEW OF SYSTEMS:  A comprehensive 14 point review of systems was negative except as noted in the HPI.    Exam: Patient Vitals for the past 24 hrs:  BP Temp Temp src Pulse Resp SpO2  07/22/20 0912 (!) 121/50 -- -- 74 18 94 %  07/22/20 0500 (!) 134/58 98.9 F (37.2 C) Oral 65 18 97 %  07/21/20 2324 (!) 120/52 98.6 F (37 C) Oral 71 20 100 %  07/21/20 2049 (!) 136/51 98.1 F (36.7 C) Oral -- 20 100 %  07/21/20 2023 (!) 132/54 98.6 F (37 C) -- 70 16 95 %  07/21/20 1836 (!) 140/57  99.2 F (37.3 C) -- 72 18 96 %  07/21/20 1802 (!) 137/51 -- -- 70 -- --  07/21/20 1511 (!) 100/44 -- -- 70 18 94 %  07/21/20 1510 -- 98.8 F (37.1 C) Oral -- -- --  07/21/20 1444 (!) 98/44 98.8 F (37.1 C) Oral 73 18 95 %    General:  well-nourished in no acute distress.   Eyes:  no scleral icterus.   ENT:  There were no oropharyngeal lesions.   Neck was without thyromegaly.   Lymphatics:  Negative cervical, supraclavicular or axillary adenopathy.   Respiratory: lungs were clear bilaterally without wheezing or crackles.   Cardiovascular:  Regular rate and rhythm, S1/S2, without murmur, rub or gallop.  There was no pedal edema.   GI:  abdomen was soft, flat, nontender, nondistended, without organomegaly.   Musculoskeletal:  Right BKA   Skin: Bruise to right arm from prior lab draw.   Neuro exam was nonfocal.  Patient was alert and oriented.  Attention was good.   Language was appropriate.  Mood was normal without depression.  Speech was not pressured.  Thought content was not tangential.    LABS:  Lab Results  Component Value Date   WBC 4.6 07/22/2020   HGB 8.3 (L) 07/22/2020   HCT 25.5 (L) 07/22/2020   PLT 119 (L) 07/22/2020   GLUCOSE 140 (H) 07/21/2020   CHOL 147 11/20/2013   TRIG 160 (H) 11/20/2013   HDL 30 (L) 11/20/2013   LDLCALC 85 11/20/2013   ALT 22 07/18/2020   AST 18 07/18/2020   NA 134 (L) 07/21/2020   K 4.2 07/21/2020   CL 99 07/21/2020   CREATININE 5.06 (H) 07/21/2020   BUN 23 07/21/2020   CO2 27 07/21/2020   PSA 0.34 03/15/2009   INR 1.2 05/26/2020   HGBA1C 5.5 05/23/2020   MICROALBUR 3.3 (H) 03/15/2009    No results found.   ASSESSMENT AND PLAN:  This is a 59 year old male with: 1.  Pancytopenia 2.  AKI on CKD likely secondary to PR-3 associated ANCA vasculitis 3.  Hypertension 4.  Atrial fibrillation 5.  Diabetes mellitus 6.  Peripheral vascular disease  -Labs significant been reviewed.  Labs prior to this admission show a normal WBC, mild  thrombocytopenia, and anemia with a hemoglobin in the 9-10 range.  He developed significant anemia and thrombocytopenia within the past 6 weeks.  Although he had heme positive stools, GI work-up did not reveal a GI source of bleeding.  Work-up does not reveal any evidence of vitamin B12 deficiency, folate deficiency, or iron deficiency.  Repeat CBC drawn today shows normalization of his WBC and significant improvement of his platelet count close to baseline.  Hemoglobin has adequately corrected following 4 units PRBCs.  Suspect pancytopenia was due to recent use of Cytoxan given that WBC and platelets are recovering.  Anemia likely multifactorial and due to underlying kidney disease.  Will perform additional work-up including DIC panel, haptoglobin, LDH, and review of peripheral blood smear.  Further recommendations pending these results. -Transfuse PRBCs for hemoglobin <7.  -Okay to resume Eliquis from our standpoint. -Nephrology following for AKI on CKD. -Management of chronic medical conditions per hospitalist.  Thank you for this referral.  Mikey Bussing, DNP, AGPCNP-BC, AOCNP Mon/Tues/Thurs/Fri 7am-5pm; Off Wednesdays Cell: [phone]  Addendum  I have seen the patient, examined him. I agree with the assessment and and plan and have edited the notes.   59 yo male with recent AKI on CKD, secondary to PR3 associated ANCA vasculitis based on kidney biopsy from May 31, 2020. He was started on Cytoxan 100 mg daily and prednisone 80 mg daily since then. He also started hemodialysis around same time. He ran out of Cytoxan about 2 weeks ago. I have reviewed his lab, peripheral blood smear, which was unremarkable, with occasional schistocytes. No lab evidence of hemolysis. I think his recent severe anemia is likely related to Cytoxan, he certainly has anemia of chronic disease from his end-stage renal disease. His mild leukopenia has resolved on repeat CBC today, mild thrombocytopenia is also  improved. Thrombocytopenia could be immune related, but would not require any treatment at this point. My suspicion for primary bone marrow disease is low, I do not think he needs a bone marrow biopsy. He would benefit from EPO which he is managed by his nephrologist. I will see him as needed in outpt. He has my contact info.   Truitt Merle  07/22/2020

## 2020-07-22 NOTE — Discharge Instructions (Signed)

## 2020-07-22 NOTE — Consult Note (Signed)
   [facility] CM Inpatient Consult   07/22/2020  SHAAN RHOADS 1961-07-23 [id number]  [place] Organization [ACO] Patient: Alexander [name] [facility]   Patient screened for extreme high risk score for unplanned readmission  and for 5 hospitalizations in the past 6 months. Medical record reviewed  to check for potential [name] needs for post hospital support.  Review of patient's medical record reveals patient was recently outreach by [place] NP and offered follow up support however notes reveal patient decline at the time.  Primary Care Provider is Tisovec, [name], MD this provider is listed to provide the transition of care [TOC] for post hospital follow up.   Plan: Continue to follow for progress and disposition to assess for post hospital care management needs.    Please place a [facility] Care Management consult as appropriate and for questions contact:   [name], RN BSN [facility] Liaison  [phone] business mobile phone Toll free office [phone]  Fax number: [phone] [email or web address]     \

## 2020-07-22 NOTE — Progress Notes (Signed)
ANTICOAGULATION CONSULT NOTE - Initial Consult  Pharmacy Consult for apixaban Indication: atrial fibrillation  Allergies  Allergen Reactions  . Keflex [Cephalexin] Nausea And Vomiting  . Pletal [Cilostazol] Palpitations    "Can hear heart beating loudly".    Patient Measurements: Height: 6' (182.9 cm) Weight: 114.4 kg (252 lb 3.3 oz) IBW/kg (Calculated) : 77.6 Heparin Dosing Weight:   Vital Signs: Temp: 98.9 F (37.2 C) (09/10 0500) Temp Source: Oral (09/10 0500) BP: 121/50 (09/10 0912) Pulse Rate: 74 (09/10 0912)  Labs: Recent Labs    07/20/20 0710 07/21/20 0934  HGB 7.1* 6.4*  HCT 22.0* 20.9*  PLT 95* 101*  CREATININE 4.05* 5.06*    Estimated Creatinine Clearance: 20 mL/min (A) (by C-G formula based on SCr of 5.06 mg/dL (H)).   Medical History: Past Medical History:  Diagnosis Date  . Alcohol abuse   . Anemia   . Arthritis    "patient does not think so."  . Atrial fibrillation ([place])   . Cardiac syncope 10/07/14   rapid A fib with 8 sec pauses on converison with syncope- temp pacing wire placed then PPM  . Cataract    BILATERAL  . Chronic kidney disease   . Coronary artery disease   . Diabetes mellitus    dx---been  awhile  . Encounter for care of pacemaker 10/08/2014  . History of blood transfusion   . Hyperlipemia   . Hypertension   . Osteomyelitis ([place])    right foot  . Peripheral vascular disease ([place])   . Presence of permanent cardiac pacemaker 10/08/2014  . Sinus node dysfunction ([place]) 10/08/2014    Medications:  Medications Prior to Admission  Medication Sig Dispense Refill Last Dose  . amLODipine (NORVASC) 5 MG tablet Take 5 mg by mouth daily.   07/18/2020 at Unknown time  . apixaban (ELIQUIS) 5 MG TABS tablet Take 1 tablet (5 mg total) by mouth 2 (two) times daily. 180 tablet 0 07/18/2020 at 0930  . aspirin EC 81 MG tablet Take 81 mg by mouth daily. Swallow whole.   07/18/2020 at Unknown time  . atorvastatin (LIPITOR) 20 MG tablet Take 1  tablet by mouth once daily (Patient taking differently: Take 20 mg by mouth daily. ) 90 tablet 2 07/17/2020 at Unknown time  . hydrALAZINE (APRESOLINE) 100 MG tablet TAKE 1 TABLET BY MOUTH THREE TIMES DAILY (Patient taking differently: Take 100 mg by mouth 3 (three) times daily. ) 270 tablet 0 07/18/2020 at Unknown time  . insulin NPH-regular Human (70-30) 100 UNIT/ML injection Inject 46-56 Units into the skin 2 (two) times daily with a meal. Take 56 units in the morning and Take 46 in the evening   07/18/2020 at Unknown time  . metoprolol tartrate (LOPRESSOR) 100 MG tablet Take 100 mg by mouth 2 (two) times daily.   07/18/2020 at 0930  . Multiple Vitamin (MULTIVITAMIN ADULT) TABS Take 1 tablet by mouth daily.   07/18/2020 at Unknown time  . prazosin (MINIPRESS) 2 MG capsule Take 1 capsule (2 mg total) by mouth daily after supper. 90 capsule 1 07/17/2020 at Unknown time  . ACCU-CHEK FASTCLIX LANCETS MISC Check blood sugar TID & QHS 102 each 2   . blood glucose meter kit and supplies KIT Check blood sugar TID & QHS 1 each 0   . Blood Glucose Monitoring Suppl (ACCU-CHEK ADVANTAGE DIABETES) kit Use as instructed 1 each 0   . cyclophosphamide (CYTOXAN) 50 MG capsule Take 50 mg by mouth daily. Take with food to  minimize GI upset. Take early in the day and maintain hydration.     Marland Kitchen glucose blood (ACCU-CHEK AVIVA PLUS) test strip Use as instructed 100 each 12   . glucose blood (CHOICE DM FORA G20 TEST STRIPS) test strip Check blood sugar TID & QHS 100 each 12   . glucose blood test strip Use as instructed 100 each 12   . isosorbide mononitrate (IMDUR) 30 MG 24 hr tablet Take 1 tablet (30 mg total) by mouth daily. 30 tablet 0    Scheduled:  . apixaban  5 mg Oral BID  . atorvastatin  20 mg Oral Daily  . Chlorhexidine Gluconate Cloth  6 each Topical Q0600  . hydrALAZINE  100 mg Oral TID  . insulin aspart  0-15 Units Subcutaneous TID WC  . insulin aspart  0-5 Units Subcutaneous QHS  . insulin glargine  20 Units  Subcutaneous BID  . isosorbide mononitrate  30 mg Oral Daily  . metoprolol tartrate  100 mg Oral BID  . prazosin  2 mg Oral QPC supper  . sodium chloride flush  3 mL Intravenous Q12H   Infusions:  . sodium chloride    . [START ON 07/23/2020] ferric gluconate (FERRLECIT/NULECIT) IV      Assessment: Pt was admitted for low hgb. He got 2 units of PRBC. He is on HD TTS. Apixaban was held to r/o GIB. No evidence for bleeding after workup. Ok to resume apixaban today.   Age<80, wt>60kg, scr>1.5  Goal of Therapy:   Monitor platelets by anticoagulation protocol: Yes   Plan:    Resume apixaban 40m PO BID Rx will monitor peripherally  MOnnie Boer PharmD, BCIDP, AAHIVP, CPP Infectious Disease Pharmacist 07/22/2020 11:10 AM

## 2020-07-22 NOTE — Progress Notes (Signed)
Brief GI Progress Note  Quick review of the VCE is not showing evidence of active or acute GI Bleeding. Formal read may take place on Friday vs next week when formal VCE is available. As there does not seem to be active acute bleeding within reach of the SBE scope, any additional findings (if any) would require DBE not available here. I think, the GI workup is likely complete at this time. We will update patient with the final results if the VCE is completed on Friday or next week (as an outpatient) after final read. No planned procedures at this time. Pathology returned showing reactive gastropathy but evidence of H. Pylori. Anticoagulation in setting of NOAC or Heparin could be restarted on 9/10.  Justice Britain, MD [facility] Office # [phone]

## 2020-07-22 NOTE — Progress Notes (Signed)
PROGRESS NOTE    Alexander [name]  ZJQ:[id number] DOB: Aug 07, 1961 DOA: 07/18/2020 PCP: [name], MD   Brief Narrative: 59 year old with past medical history significant for ESRD, AKI due to necrotizing glomerulonephritis/ANCA vasculitis started on hemodialysis in July, chronic Cytoxan therapy started 25-month prior, chronic anemia recent extensive GI work-up, peripheral vascular disease, right BKA, chronic diastolic heart failure, paroxysmal A. fib on Eliquis, CAD, diabetes type 2 who presents to the emergency department due to severe anemia noticed on lab with a hemoglobin of 5.8.  He denies overt hematemesis or melena, he took Cytoxan for about a month after his discharge in July and after the run out of this medicine. Heme positive in the ED.  Also noted to have pancytopenia.  He received 2 unit of packed red blood cell during admission.  Assessment & Plan:   Active Problems:   Acute blood loss anemia   1-Acute on chronic anemia, symptomatic anemia, pancytopenia: Suspect multifactorial.  Endoscopy showed stable duodenitis, small gastric erosion no evidence of bleeding. Had video capsule 9/08. Results negative. No further evaluation per GI.  He has Received 4 unit of PRBC so far.  Hb drop to 6.4. transfuse  2 units PRBC on 9/09.  Iron IV order by nephrology with HD.  B 12 normal, folic acid normal. Iron low.  Hematology oncology consulted.  Plan to resume eliquis.   2-ESRD necrotizing glomerulonephritis/ANCA vasculitis Continue with hemodialysis, nephrology following No further  Cytoxan per Nephrology    3-Hypertension: Continue with metoprolol.  Hold amlodipine due to soft blood pressure.  Holding parameters for hydralazine  4-Diabetes type 2: Continue Lantus and a sliding scale  5-Peripheral vascular disease a status post right BKA   Estimated body mass index is 34.21 kg/m as calculated from the following:   Height as of this encounter: 6' (1.829 m).   Weight as of  this encounter: 114.4 kg.   DVT prophylaxis: Eliquis Code Status: Full code Family Communication: Care discussed with patient Disposition Plan:  Status is: Inpatient  Remains inpatient appropriate because:Ongoing diagnostic testing needed not appropriate for outpatient work up   Dispo: The patient is from: Home              Anticipated d/c is to: Home              Anticipated d/c date is: 1 day              Patient currently is not medically stable to d/c.Hematology -oncology evaluation for pancytopenia        Consultants:   Nephrology  GI  Procedures:  EndoscopyNo gross lesions in esophagus. Z-line regular, 45 cm from the incisors.                           - Erosive gastropathy with no bleeding and no                            stigmata of recent bleeding. No other gross lesions                            in the stomach. Biopsied.                           - Erythematous duodenopathy in Bulb/D1/D2.  Otherwise normal mucosa was found in the entire                            examined duodenum. Melanosis noted.                           - Normal mucosa was found in the visualized                            proximal jejunum. Melanosis noted. Tattooed distal                             extent of SBE today.  Antimicrobials:    Subjective: He denies abdominal pain, melena, hematochezia  Objective: Vitals:   07/21/20 2049 07/21/20 2324 07/22/20 0500 07/22/20 0912  BP: (!) 136/51 (!) 120/52 (!) 134/58 (!) 121/50  Pulse:  71 65 74  Resp: 20 20 18 18   Temp: 98.1 F (36.7 C) 98.6 F (37 C) 98.9 F (37.2 C)   TempSrc: Oral Oral Oral   SpO2: 100% 100% 97% 94%  Weight:      Height:        Intake/Output Summary (Last 24 hours) at 07/22/2020 1256 Last data filed at 07/22/2020 0854 Gross per 24 hour  Intake 1020 ml  Output 200 ml  Net 820 ml   Filed Weights   07/20/20 0936 07/21/20 0730 07/21/20 1120  Weight: 117.4 kg 120.2 kg 114.4 kg      Examination:  General exam: NAD Respiratory system: CTA Cardiovascular system: S 1, S 2 RRR Gastrointestinal system: BS present, soft nt Central nervous system: Non focal.  Extremities: left fore foot amputation, right BKA   Data Reviewed: I have personally reviewed following labs and imaging studies  CBC: Recent Labs  Lab 07/18/20 1311 07/19/20 0615 07/20/20 0710 07/21/20 0934  WBC 3.0* 3.6* 3.9* 3.6*  HGB 5.6* 7.0* 7.1* 6.4*  HCT 18.3* 21.6* 22.0* 20.9*  MCV 94.8 91.9 94.8 95.4  PLT 80* 94* 95* 993*   Basic Metabolic Panel: Recent Labs  Lab 07/18/20 1311 07/19/20 0615 07/20/20 0710 07/21/20 0934  NA 135 137 138 134*  K 3.8 4.1 4.3 4.2  CL 95* 98 103 99  CO2 29 30 24 27   GLUCOSE 267* 173* 155* 140*  BUN 31* 35* 19 23  CREATININE 5.21* 5.66* 4.05* 5.06*  CALCIUM 8.8* 8.8* 8.3* 8.4*  MG  --  1.9  --   --   PHOS  --   --   --  4.2   GFR: Estimated Creatinine Clearance: 20 mL/min (A) (by C-G formula based on SCr of 5.06 mg/dL (H)). Liver Function Tests: Recent Labs  Lab 07/18/20 1311 07/21/20 0934  AST 18  --   ALT 22  --   ALKPHOS 65  --   BILITOT 0.8  --   PROT 5.5*  --   ALBUMIN 3.2* 2.8*   No results for input(s): LIPASE, AMYLASE in the last 168 hours. No results for input(s): AMMONIA in the last 168 hours. Coagulation Profile: No results for input(s): INR, PROTIME in the last 168 hours. Cardiac Enzymes: No results for input(s): CKTOTAL, CKMB, CKMBINDEX, TROPONINI in the last 168 hours. BNP (last 3 results) No results for input(s): PROBNP in the last 8760 hours. HbA1C: No results for input(s): HGBA1C in the last  72 hours. CBG: Recent Labs  Lab 07/21/20 1142 07/21/20 1635 07/21/20 2020 07/22/20 0615 07/22/20 1123  GLUCAP 103* 237* 197* 160* 231*   Lipid Profile: No results for input(s): CHOL, HDL, LDLCALC, TRIG, CHOLHDL, LDLDIRECT in the last 72 hours. Thyroid Function Tests: No results for input(s): TSH, T4TOTAL, FREET4, T3FREE,  THYROIDAB in the last 72 hours. Anemia Panel: Recent Labs    07/21/20 0354  VITAMINB12 516  FOLATE 13.1  FERRITIN 401*  TIBC 218*  IRON 41*  RETICCTPCT 2.9   Sepsis Labs: No results for input(s): PROCALCITON, LATICACIDVEN in the last 168 hours.  Recent Results (from the past 240 hour(s))  SARS Coronavirus 2 by RT PCR (hospital order, performed in [facility] hospital lab) Nasopharyngeal Nasopharyngeal Swab     Status: None   Collection Time: 07/18/20  4:47 PM   Specimen: Nasopharyngeal Swab  Result Value Ref Range Status   SARS Coronavirus 2 NEGATIVE NEGATIVE Final    Comment: (NOTE) SARS-CoV-2 target nucleic acids are NOT DETECTED.  The SARS-CoV-2 RNA is generally detectable in upper and lower respiratory specimens during the acute phase of infection. The lowest concentration of SARS-CoV-2 viral copies this assay can detect is 250 copies / mL. A negative result does not preclude SARS-CoV-2 infection and should not be used as the sole basis for treatment or other patient management decisions.  A negative result may occur with improper specimen collection / handling, submission of specimen other than nasopharyngeal swab, presence of viral mutation(s) within the areas targeted by this assay, and inadequate number of viral copies (<250 copies / mL). A negative result must be combined with clinical observations, patient history, and epidemiological information.  Fact Sheet for Patients:   StrictlyIdeas.no  Fact Sheet for Healthcare Providers: BankingDealers.co.za  This test is not yet approved or  cleared by the [place] FDA and has been authorized for detection and/or diagnosis of SARS-CoV-2 by FDA under an Emergency Use Authorization (EUA).  This EUA will remain in effect (meaning this test can be used) for the duration of the COVID-19 declaration under Section 564(b)(1) of the Act, 21 U.S.C. section 360bbb-3(b)(1),  unless the authorization is terminated or revoked sooner.  Performed at [facility], [place] [address], [place], [place] [address]          Radiology Studies: No results found.      Scheduled Meds:  apixaban  5 mg Oral BID   atorvastatin  20 mg Oral Daily   Chlorhexidine Gluconate Cloth  6 each Topical Q0600   hydrALAZINE  100 mg Oral TID   insulin aspart  0-15 Units Subcutaneous TID WC   insulin aspart  0-5 Units Subcutaneous QHS   insulin glargine  20 Units Subcutaneous BID   isosorbide mononitrate  30 mg Oral Daily   metoprolol tartrate  100 mg Oral BID   prazosin  2 mg Oral QPC supper   sodium chloride flush  3 mL Intravenous Q12H   Continuous Infusions:  sodium chloride     [START ON 07/23/2020] ferric gluconate (FERRLECIT/NULECIT) IV       LOS: 3 days    Time spent: 35 minutes.     Elmarie Shiley, MD [facility]   If 7PM-7AM, please contact night-coverage [email or web address]  07/22/2020, 12:56 PM

## 2020-07-23 LAB — CBC
HCT: 27.9 % — ABNORMAL LOW (ref 39.0–52.0)
Hemoglobin: 9 g/dL — ABNORMAL LOW (ref 13.0–17.0)
MCH: 29.7 pg (ref 26.0–34.0)
MCHC: 32.3 g/dL (ref 30.0–36.0)
MCV: 92.1 fL (ref 80.0–100.0)
Platelets: 135 10*3/uL — ABNORMAL LOW (ref 150–400)
RBC: 3.03 MIL/uL — ABNORMAL LOW (ref 4.22–5.81)
RDW: 16.4 % — ABNORMAL HIGH (ref 11.5–15.5)
WBC: 4.5 10*3/uL (ref 4.0–10.5)
nRBC: 0.7 % — ABNORMAL HIGH (ref 0.0–0.2)

## 2020-07-23 LAB — GLUCOSE, CAPILLARY
Glucose-Capillary: 136 mg/dL — ABNORMAL HIGH (ref 70–99)
Glucose-Capillary: 140 mg/dL — ABNORMAL HIGH (ref 70–99)

## 2020-07-23 LAB — HAPTOGLOBIN: Haptoglobin: 86 mg/dL (ref 32–363)

## 2020-07-23 MED ORDER — SODIUM CHLORIDE 0.9 % IV SOLN: 100.0000 mL | INTRAVENOUS | Status: DC | PRN

## 2020-07-23 MED ORDER — HEPARIN SODIUM (PORCINE) 1000 UNIT/ML DIALYSIS: 1000.0000 [IU] | INTRAMUSCULAR | Status: DC | PRN

## 2020-07-23 MED ORDER — LIDOCAINE-PRILOCAINE 2.5-2.5 % EX CREA: 1.0000 "application " | TOPICAL_CREAM | CUTANEOUS | Status: DC | PRN

## 2020-07-23 MED ORDER — HEPARIN SODIUM (PORCINE) 1000 UNIT/ML IJ SOLN
INTRAMUSCULAR | Status: AC
  Filled 2020-07-23: qty 4

## 2020-07-23 MED ORDER — PENTAFLUOROPROP-TETRAFLUOROETH EX AERO: 1.0000 "application " | INHALATION_SPRAY | CUTANEOUS | Status: DC | PRN

## 2020-07-23 MED ORDER — APIXABAN 5 MG PO TABS: 5.0000 mg | ORAL_TABLET | Freq: Two times a day (BID) | ORAL | 0 refills | Status: DC

## 2020-07-23 MED ORDER — ALTEPLASE 2 MG IJ SOLR: 2.0000 mg | Freq: Once | INTRAMUSCULAR | Status: DC | PRN

## 2020-07-23 MED ORDER — INSULIN NPH ISOPHANE & REGULAR (70-30) 100 UNIT/ML ~~LOC~~ SUSP: 25.0000 [IU] | Freq: Two times a day (BID) | SUBCUTANEOUS | 11 refills | Status: DC

## 2020-07-23 MED ORDER — LIDOCAINE HCL (PF) 1 % IJ SOLN: 5.0000 mL | INTRAMUSCULAR | Status: DC | PRN

## 2020-07-23 NOTE — Plan of Care (Signed)
All goals met. 

## 2020-07-23 NOTE — Procedures (Signed)
   I was present at this dialysis session, have reviewed the session itself and made  appropriate changes Kelly Splinter MD [place] pager [phone]   07/23/2020, 9:34 AM

## 2020-07-23 NOTE — Discharge Summary (Signed)
Physician Discharge Summary  Alexander [name] KDX:[id number] DOB: 1961-04-21 DOA: 07/18/2020  PCP: [name], MD  Admit date: 07/18/2020 Discharge date: 07/23/2020  Admitted From: Home  Disposition:  Home   Recommendations for Outpatient Follow-up:  1. Follow up with PCP in 1-2 weeks 2. Please obtain BMP/CBC in one week 3. Follow cbc to follow counts.  4. Follow up with nephrology   Home Health: none  Discharge Condition: Stable.  CODE STATUS: Full code Diet recommendation: Heart Healthy   Brief/Interim Summary: 59 year old with past medical history significant for ESRD, AKI due to necrotizing glomerulonephritis/ANCA vasculitis started on hemodialysis in July, chronic Cytoxan therapy started 18-monthprior, chronic anemia recent extensive GI work-up, peripheral vascular disease, right BKA, chronic diastolic heart failure, paroxysmal A. fib on Eliquis, CAD, diabetes type 2 who presents to the emergency department due to severe anemia noticed on lab with a hemoglobin of 5.8.  He denies overt hematemesis or melena, he took Cytoxan for about a month after his discharge in July and after the run out of this medicine. Heme positive in the ED.  Also noted to have pancytopenia.  He received 2 unit of packed red blood cell during admission.  1-Acute on chronic anemia, symptomatic anemia, pancytopenia: secondary to cytoxan.  Suspect multifactorial.  Endoscopy showed stable duodenitis, small gastric erosion no evidence of bleeding. Had video capsule 9/08. Results negative. No further evaluation per GI.  He has Received 4 unit of PRBC so far.  Hb drop to 6.4. transfuse  2 units PRBC on 9/09.  Iron IV order by nephrology with HD.  B 12 normal, folic acid normal. Iron low.  Hematology oncology consulted. Pancytopenia related to Cytoxan Plan to resume eliquis. received guidance for CM for medications assistance.  Labs stable today. Plan to discharge home today.   2-ESRD necrotizing  glomerulonephritis/ANCA vasculitis Continue with hemodialysis, nephrology following No further  Cytoxan per Nephrology    3-Hypertension: Continue with metoprolol.  Hold amlodipine due to soft blood pressure.  Holding parameters for hydralazine  4-Diabetes type 2: Continue Lantus and a sliding scale  5-Peripheral vascular disease a status post right BKA   Discharge Diagnoses:  Active Problems:   Acute blood loss anemia   Pancytopenia ([facility]    Discharge Instructions  Discharge Instructions    Diet - low sodium heart healthy   Complete by: As directed    Increase activity slowly   Complete by: As directed    No wound care   Complete by: As directed      Allergies as of 07/23/2020      Reactions   Keflex [cephalexin] Nausea And Vomiting   Pletal [cilostazol] Palpitations   "Can hear heart beating loudly".      Medication List    STOP taking these medications   aspirin EC 81 MG tablet   cyclophosphamide 50 MG capsule Commonly known as: CYTOXAN     TAKE these medications   Accu-Chek Advantage Diabetes kit Use as instructed   Accu-Chek FastClix Lancets Misc Check blood sugar TID & QHS   amLODipine 5 MG tablet Commonly known as: NORVASC Take 5 mg by mouth daily.   apixaban 5 MG Tabs tablet Commonly known as: Eliquis Take 1 tablet (5 mg total) by mouth 2 (two) times daily.   atorvastatin 20 MG tablet Commonly known as: LIPITOR Take 1 tablet by mouth once daily   blood glucose meter kit and supplies Kit Check blood sugar TID & QHS   glucose blood test  strip Use as instructed   glucose blood test strip Commonly known as: Accu-Chek Aviva Plus Use as instructed   glucose blood test strip Commonly known as: Choice DM Fora G20 Test Strips Check blood sugar TID & QHS   hydrALAZINE 100 MG tablet Commonly known as: APRESOLINE TAKE 1 TABLET BY MOUTH THREE TIMES DAILY   insulin NPH-regular Human (70-30) 100 UNIT/ML injection Inject 25 Units into  the skin 2 (two) times daily with a meal. What changed:   how much to take  additional instructions   isosorbide mononitrate 30 MG 24 hr tablet Commonly known as: IMDUR Take 1 tablet (30 mg total) by mouth daily.   metoprolol tartrate 100 MG tablet Commonly known as: LOPRESSOR Take 100 mg by mouth 2 (two) times daily.   Multivitamin Adult Tabs Take 1 tablet by mouth daily.   prazosin 2 MG capsule Commonly known as: MINIPRESS Take 1 capsule (2 mg total) by mouth daily after supper.       Allergies  Allergen Reactions  . Keflex [Cephalexin] Nausea And Vomiting  . Pletal [Cilostazol] Palpitations    "Can hear heart beating loudly".    Consultations:  Nephrology  Oncology    Procedures/Studies:  No results found.   Subjective: He is feeling well.   Discharge Exam: Vitals:   07/23/20 1030 07/23/20 1246  BP: (!) 145/63 (!) 152/76  Pulse: 65 71  Resp:  18  Temp:  98 F (36.7 C)  SpO2:  97%     General: Pt is alert, awake, not in acute distress Cardiovascular: RRR, S1/S2 +, no rubs, no gallops Respiratory: CTA bilaterally, no wheezing, no rhonchi Abdominal: Soft, NT, ND, bowel sounds + Extremities: no edema, no cyanosis    The results of significant diagnostics from this hospitalization (including imaging, microbiology, ancillary and laboratory) are listed below for reference.     Microbiology: Recent Results (from the past 240 hour(s))  SARS Coronavirus 2 by RT PCR (hospital order, performed in [facility] hospital lab) Nasopharyngeal Nasopharyngeal Swab     Status: None   Collection Time: 07/18/20  4:47 PM   Specimen: Nasopharyngeal Swab  Result Value Ref Range Status   SARS Coronavirus 2 NEGATIVE NEGATIVE Final    Comment: (NOTE) SARS-CoV-2 target nucleic acids are NOT DETECTED.  The SARS-CoV-2 RNA is generally detectable in upper and lower respiratory specimens during the acute phase of infection. The lowest concentration of SARS-CoV-2  viral copies this assay can detect is 250 copies / mL. A negative result does not preclude SARS-CoV-2 infection and should not be used as the sole basis for treatment or other patient management decisions.  A negative result may occur with improper specimen collection / handling, submission of specimen other than nasopharyngeal swab, presence of viral mutation(s) within the areas targeted by this assay, and inadequate number of viral copies (<250 copies / mL). A negative result must be combined with clinical observations, patient history, and epidemiological information.  Fact Sheet for Patients:   StrictlyIdeas.no  Fact Sheet for Healthcare Providers: BankingDealers.co.za  This test is not yet approved or  cleared by the [place] FDA and has been authorized for detection and/or diagnosis of SARS-CoV-2 by FDA under an Emergency Use Authorization (EUA).  This EUA will remain in effect (meaning this test can be used) for the duration of the COVID-19 declaration under Section 564(b)(1) of the Act, 21 U.S.C. section 360bbb-3(b)(1), unless the authorization is terminated or revoked sooner.  Performed at [facility], [address], [place], [place] [address]   MRSA PCR Screening     Status: None   Collection Time: 07/22/20  7:50 PM   Specimen: Nasopharyngeal  Result Value Ref Range Status   MRSA by PCR NEGATIVE NEGATIVE Final    Comment:        The GeneXpert MRSA Assay (FDA approved for NASAL specimens only), is one component of a comprehensive MRSA colonization surveillance program. It is not intended to diagnose MRSA infection nor to guide or monitor treatment for MRSA infections. Performed at [facility], [place] [address], [place], [place] [address]      Labs: BNP (last 3 results) No results for input(s): BNP in the last 8760 hours. Basic Metabolic Panel: Recent Labs  Lab 07/18/20 1311 07/19/20 0615  07/20/20 0710 07/21/20 0934  NA 135 137 138 134*  K 3.8 4.1 4.3 4.2  CL 95* 98 103 99  CO2 '29 30 24 27  ' GLUCOSE 267* 173* 155* 140*  BUN 31* 35* 19 23  CREATININE 5.21* 5.66* 4.05* 5.06*  CALCIUM 8.8* 8.8* 8.3* 8.4*  MG  --  1.9  --   --   PHOS  --   --   --  4.2   Liver Function Tests: Recent Labs  Lab 07/18/20 1311 07/21/20 0934  AST 18  --   ALT 22  --   ALKPHOS 65  --   BILITOT 0.8  --   PROT 5.5*  --   ALBUMIN 3.2* 2.8*   No results for input(s): LIPASE, AMYLASE in the last 168 hours. No results for input(s): AMMONIA in the last 168 hours. CBC: Recent Labs  Lab 07/19/20 0615 07/19/20 0615 07/20/20 0710 07/21/20 0934 07/22/20 1300 07/22/20 1511 07/23/20 0925  WBC 3.6*  --  3.9* 3.6* 4.6  --  4.5  HGB 7.0*  --  7.1* 6.4* 8.3*  --  9.0*  HCT 21.6*  --  22.0* 20.9* 25.5*  --  27.9*  MCV 91.9  --  94.8 95.4 92.1  --  92.1  PLT 94*   < > 95* 101* 119* 126* 135*   < > = values in this interval not displayed.   Cardiac Enzymes: No results for input(s): CKTOTAL, CKMB, CKMBINDEX, TROPONINI in the last 168 hours. BNP: Invalid input(s): POCBNP CBG: Recent Labs  Lab 07/22/20 0615 07/22/20 1123 07/22/20 1729 07/22/20 2040 07/23/20 0649  GLUCAP 160* 231* 212* 207* 136*   D-Dimer Recent Labs    07/22/20 1511  DDIMER 1.99*   Hgb A1c No results for input(s): HGBA1C in the last 72 hours. Lipid Profile No results for input(s): CHOL, HDL, LDLCALC, TRIG, CHOLHDL, LDLDIRECT in the last 72 hours. Thyroid function studies No results for input(s): TSH, T4TOTAL, T3FREE, THYROIDAB in the last 72 hours.  Invalid input(s): FREET3 Anemia work up Recent Labs    07/21/20 0354  VITAMINB12 516  FOLATE 13.1  FERRITIN 401*  TIBC 218*  IRON 41*  RETICCTPCT 2.9   Urinalysis    Component Value Date/Time   COLORURINE YELLOW 05/23/2020 2057   APPEARANCEUR CLEAR 05/23/2020 2057   LABSPEC 1.008 05/23/2020 2057   PHURINE 5.0 05/23/2020 2057   GLUCOSEU 50 (A)  05/23/2020 2057   HGBUR LARGE (A) 05/23/2020 2057   BILIRUBINUR NEGATIVE 05/23/2020 2057   BILIRUBINUR neg 10/01/2014 1445   KETONESUR NEGATIVE 05/23/2020 2057   PROTEINUR 100 (A) 05/23/2020 2057   UROBILINOGEN 0.2 10/01/2014 1445   UROBILINOGEN 0.2 09/30/2014 2051   NITRITE NEGATIVE  05/23/2020 2057   LEUKOCYTESUR NEGATIVE 05/23/2020 2057   Sepsis Labs Invalid input(s): PROCALCITONIN,  WBC,  LACTICIDVEN Microbiology Recent Results (from the past 240 hour(s))  SARS Coronavirus 2 by RT PCR (hospital order, performed in [facility] hospital lab) Nasopharyngeal Nasopharyngeal Swab     Status: None   Collection Time: 07/18/20  4:47 PM   Specimen: Nasopharyngeal Swab  Result Value Ref Range Status   SARS Coronavirus 2 NEGATIVE NEGATIVE Final    Comment: (NOTE) SARS-CoV-2 target nucleic acids are NOT DETECTED.  The SARS-CoV-2 RNA is generally detectable in upper and lower respiratory specimens during the acute phase of infection. The lowest concentration of SARS-CoV-2 viral copies this assay can detect is 250 copies / mL. A negative result does not preclude SARS-CoV-2 infection and should not be used as the sole basis for treatment or other patient management decisions.  A negative result may occur with improper specimen collection / handling, submission of specimen other than nasopharyngeal swab, presence of viral mutation(s) within the areas targeted by this assay, and inadequate number of viral copies (<250 copies / mL). A negative result must be combined with clinical observations, patient history, and epidemiological information.  Fact Sheet for Patients:   StrictlyIdeas.no  Fact Sheet for Healthcare Providers: BankingDealers.co.za  This test is not yet approved or  cleared by the [place] FDA and has been authorized for detection and/or diagnosis of SARS-CoV-2 by FDA under an Emergency Use Authorization (EUA).  This EUA will  remain in effect (meaning this test can be used) for the duration of the COVID-19 declaration under Section 564(b)(1) of the Act, 21 U.S.C. section 360bbb-3(b)(1), unless the authorization is terminated or revoked sooner.  Performed at [facility], [place] [address], [place], [place] [address]   MRSA PCR Screening     Status: None   Collection Time: 07/22/20  7:50 PM   Specimen: Nasopharyngeal  Result Value Ref Range Status   MRSA by PCR NEGATIVE NEGATIVE Final    Comment:        The GeneXpert MRSA Assay (FDA approved for NASAL specimens only), is one component of a comprehensive MRSA colonization surveillance program. It is not intended to diagnose MRSA infection nor to guide or monitor treatment for MRSA infections. Performed at [facility], [place] [address], [place], [place] [address]      Time coordinating discharge: 40 minutes  SIGNED:   Elmarie Shiley, MD  [facility]

## 2020-07-23 NOTE — TOC Transition Note (Signed)
Transition of Care [facility]) - CM/SW Discharge Note   Patient Details  Name: Alexander [name] MRN: [id number] Date of Birth: [birth year]  Transition of Care [facility]) CM/SW Contact:  Bartholomew Crews, RN Phone Number: [phone] 07/23/2020, 2:04 PM   Clinical Narrative:     Spoke with patient at the bedside. Discussed importance of taking his eliquis. Discussed that patient assistance information was provided to his daughter last week. No further TOC needs.   Final next level of care: Home/Self Care Barriers to Discharge: No Barriers Identified   Patient Goals and CMS Choice   CMS Medicare.gov Compare Post Acute Care list provided to:: Patient Choice offered to / list presented to : NA  Discharge Placement                       Discharge Plan and Services                DME Arranged: N/A DME Agency: NA       HH Arranged: NA HH Agency: NA        Social Determinants of Health (SDOH) Interventions     Readmission Risk Interventions Readmission Risk Prevention Plan 05/26/2020 03/15/2020  Transportation Screening Complete Complete  PCP or Specialist Appt within 5-7 Days - Complete  Home Care Screening - Complete  Medication Review (RN CM) - Complete  Medication Review Press photographer) Complete -  PCP or Specialist appointment within 3-5 days of discharge Complete -  [place] or Home Care Consult Complete -  SW Recovery Care/Counseling Consult Complete -  Palliative Care Screening Not Applicable -  [place] Not Applicable -  Some recent data might be hidden

## 2020-07-23 NOTE — Progress Notes (Signed)
[facility] Progress Note   Subjective:   Patient seen on HD, tolerating well. No concerns, denies SOB, CP, palpitations, dizziness, abdominal pain, N/V/D. Reports edema is resolved. He is hoping to go home soon.   Objective Vitals:   07/22/20 0500 07/22/20 0912 07/22/20 2043 07/23/20 0440  BP: (!) 134/58 (!) 121/50 (!) 154/63 (!) 149/65  Pulse: 65 74 71 67  Resp: 18 18 18 18   Temp: 98.9 F (37.2 C)  98.5 F (36.9 C) 98.5 F (36.9 C)  TempSrc: Oral     SpO2: 97% 94% 96% 98%  Weight:      Height:       Physical Exam General: Well developed male, alert, NAD Heart: RRR, no murmurs, rubs or gallops Lungs:CTA bilaterally without wheezing, rhonchi or rales Abdomen: Soft, non-tender, non-distended, +BS Extremities: No edema b/l lower extremities Dialysis Access: R IJ [facility] accessed  Additional Objective Labs: Basic Metabolic Panel: Recent Labs  Lab 07/19/20 0615 07/20/20 0710 07/21/20 0934  NA 137 138 134*  K 4.1 4.3 4.2  CL 98 103 99  CO2 30 24 27   GLUCOSE 173* 155* 140*  BUN 35* 19 23  CREATININE 5.66* 4.05* 5.06*  CALCIUM 8.8* 8.3* 8.4*  PHOS  --   --  4.2   Liver Function Tests: Recent Labs  Lab 07/18/20 1311 07/21/20 0934  AST 18  --   ALT 22  --   ALKPHOS 65  --   BILITOT 0.8  --   PROT 5.5*  --   ALBUMIN 3.2* 2.8*   CBC: Recent Labs  Lab 07/18/20 1311 07/18/20 1311 07/19/20 0615 07/19/20 0615 07/20/20 0710 07/20/20 0710 07/21/20 0934 07/22/20 1300 07/22/20 1511  WBC 3.0*   < > 3.6*   < > 3.9*  --  3.6* 4.6  --   HGB 5.6*   < > 7.0*   < > 7.1*  --  6.4* 8.3*  --   HCT 18.3*   < > 21.6*   < > 22.0*  --  20.9* 25.5*  --   MCV 94.8  --  91.9  --  94.8  --  95.4 92.1  --   PLT 80*   < > 94*   < > 95*   < > 101* 119* 126*   < > = values in this interval not displayed.   Blood Culture    Component Value Date/Time   SDES BONE RIGHT FOOT WOUND 01/26/2020 1026   SPECREQUEST A 01/26/2020 1026   CULT  01/26/2020 1026    FEW  DIPHTHEROIDS(CORYNEBACTERIUM SPECIES) FEW STREPTOCOCCUS MITIS/ORALIS Standardized susceptibility testing for this organism is not available. FOR DIPHTHEROIDS  (CORYNBACTERIUM SPECIES) NO ANAEROBES ISOLATED Performed at [facility], [place] [address] [place], [place] [address]    REPTSTATUS 02/01/2020 FINAL 01/26/2020 1026    CBG: Recent Labs  Lab 07/22/20 0615 07/22/20 1123 07/22/20 1729 07/22/20 2040 07/23/20 0649  GLUCAP 160* 231* 212* 207* 136*   Iron Studies:  Recent Labs    07/21/20 0354  IRON 41*  TIBC 218*  FERRITIN 401*   Medications: . sodium chloride    . sodium chloride    . sodium chloride    . ferric gluconate (FERRLECIT/NULECIT) IV     . apixaban  5 mg Oral BID  . atorvastatin  20 mg Oral Daily  . Chlorhexidine Gluconate Cloth  6 each Topical Q0600  . hydrALAZINE  100 mg Oral TID  . insulin aspart  0-15 Units Subcutaneous  TID WC  . insulin aspart  0-5 Units Subcutaneous QHS  . insulin glargine  20 Units Subcutaneous BID  . isosorbide mononitrate  30 mg Oral Daily  . metoprolol tartrate  100 mg Oral BID  . prazosin  2 mg Oral QPC supper  . sodium chloride flush  3 mL Intravenous Q12H    Dialysis Orders: [facility] TTS. 180NRe, 3:45hr, BFR 400, DFR 800, EDw 118kg, 2K/2.5Ca, TDC -No heparin -Calcitriol 0.75 mcg PO q HD  Last mircera dose was 253mcg on 07/14/20 (prior doses 138mcg on 06/30/20 and 60 mcg on 06/18/20). Tsat was 49% on 8/19 with ferritin 572. Patient has not been receiving IV Fe.   Recent hemoglobin trend:  07/14/20 5.8 8.26/217.0 8/19/217.1 8/12/217.3  06/16/20 7.7 7/29/219.3  Assessment/Plan: 1. Acute on chronic anemia:Hgb 5.6, FOBT + on admission. Had GI bleed last admission as well. Seen by Dwain Sarna had enteroscopy with placement of video capsule, work up negative. Per hematology, cytoxan likely contributed to pancytopenia, low suspicion for primary  bone marrow disease. Resumed apixaban, Hgb stable today. Receiving IV Fe with dialysis, up to date on ESA.  2. AKI on CKD: Biopsy during admission 05/31/20 consistent withAKI likely secondary to PR3 associated ANCA vasculitis.Biopsy with 50% scar tissue.Dialysis dependent since last admit 05/2020 with minimal renal recovery today. Reports he took cytoxanand prednisonefor about 1 month before running out2 weeks prior to admission.We recommend that immunosuppression(cytoxan/ pred)be stopped/ not restarted at this time, because of lack of renal improvement, side effects from cytoxan and sig initial chronicity on the biopsy in July (50% scarring). Scr 4.05-5.66 since adm. Patient reports he has started to make more urine over the past few days 500 cc's reported yesterday, Will continue dialysis on TTS schedule here and follow creatinine/urine output.Will eventually need permanent HD access if he fails to regain renal function. 3. Hypertension/volume :BP well controlled today. No evidence of volume excess by exam. UF as tolerated 4. Anemia:Hgb 6.4 S/P 2 units PRBCs 07/21/2020. Now has rec'd 4 units PRBCs since admission.  5. Metabolic bone disease:Calcium and phos at goal. Has not been getting renvela this admission, will hold off on restarting for now and follow phos trend.  6. T2DM: Insulin management per primary  Anice Paganini, PA-C 07/23/2020, 8:48 AM  [facility] Pager: [phone]

## 2020-07-24 ENCOUNTER — Telehealth: Payer: Self-pay | Admitting: Physician Assistant

## 2020-07-24 NOTE — Telephone Encounter (Signed)
Transition of care contact from inpatient facility  Date of discharge: 07/23/20 Date of contact: 07/24/20 Method: Phone Spoke to: Patient  Patient contacted to discuss transition of care from recent inpatient hospitalization. Patient was admitted to [facility] from 07/18/20-07/23/20 with discharge diagnosis of acute on chronic anemia/pancytopenia.   Medication changes were reviewed. Patient reminded to stop cytoxan and eliquis. Per notes, case manager provided info on patient assistance for Eliquis, confirmed with patient. No other needs identified at this time.  Patient will follow up with his/her outpatient HD unit on: 07/26/20  Anice Paganini, PA-C 07/24/2020, 2:13 PM  [facility]

## 2020-07-25 ENCOUNTER — Ambulatory Visit: Payer: Medicare Other

## 2020-07-25 ENCOUNTER — Other Ambulatory Visit: Payer: Self-pay

## 2020-07-25 DIAGNOSIS — I2510 Atherosclerotic heart disease of native coronary artery without angina pectoris: Secondary | ICD-10-CM

## 2020-07-25 DIAGNOSIS — I472 Ventricular tachycardia: Secondary | ICD-10-CM | POA: Diagnosis not present

## 2020-07-25 DIAGNOSIS — I4729 Other ventricular tachycardia: Secondary | ICD-10-CM

## 2020-07-26 DIAGNOSIS — N179 Acute kidney failure, unspecified: Secondary | ICD-10-CM | POA: Diagnosis not present

## 2020-07-26 NOTE — Progress Notes (Signed)
Spoke to patient's wife understood results and is scheduled for tomorrow afternoon.

## 2020-07-27 ENCOUNTER — Other Ambulatory Visit: Payer: Self-pay

## 2020-07-27 ENCOUNTER — Ambulatory Visit: Payer: Medicare Other | Admitting: Cardiology

## 2020-07-27 ENCOUNTER — Encounter: Payer: Self-pay | Admitting: Cardiology

## 2020-07-27 VITALS — BP 142/60 | HR 64 | Resp 16 | Ht 72.0 in | Wt 270.0 lb

## 2020-07-27 DIAGNOSIS — I4821 Permanent atrial fibrillation: Secondary | ICD-10-CM | POA: Diagnosis not present

## 2020-07-27 DIAGNOSIS — Z7901 Long term (current) use of anticoagulants: Secondary | ICD-10-CM | POA: Diagnosis not present

## 2020-07-27 DIAGNOSIS — I2510 Atherosclerotic heart disease of native coronary artery without angina pectoris: Secondary | ICD-10-CM

## 2020-07-27 DIAGNOSIS — D61818 Other pancytopenia: Secondary | ICD-10-CM | POA: Diagnosis not present

## 2020-07-27 DIAGNOSIS — I10 Essential (primary) hypertension: Secondary | ICD-10-CM | POA: Diagnosis not present

## 2020-07-27 DIAGNOSIS — R9439 Abnormal result of other cardiovascular function study: Secondary | ICD-10-CM | POA: Diagnosis not present

## 2020-07-27 DIAGNOSIS — D631 Anemia in chronic kidney disease: Secondary | ICD-10-CM | POA: Diagnosis not present

## 2020-07-27 MED ORDER — ISOSORBIDE MONONITRATE ER 30 MG PO TB24: 30.0000 mg | ORAL_TABLET | Freq: Every day | ORAL | 0 refills | Status: DC

## 2020-07-27 NOTE — Progress Notes (Signed)
Alexander [name] Date of Birth: February 01, 1961 MRN: [id number] Primary Care Provider:Tisovec, [name], MD Former Cardiology Providers: Dr. Adrian Prows, Jeri Lager, APRN, FNP-C Primary Cardiologist: Rex Kras, DO, [facility] (established care 04/22/2020) Electrophysiologist: Dr. Cristopher Peru  Date: 07/27/2020 Last Office Visit: 07/13/2020  Chief Complaint  Patient presents with  . Follow-up    abnormal stress test     HPI  Alexander [name] is a 59 y.o.  male who presents to the office with a chief complaint of " abnormal stress test." Patient has a complex past medical history and multiple cardiovascular risk factors which include but not limited to: Paroxysmal atrial fibrillation, on oral anticoagulation, coronary artery disease ( angioplasty and stenting to the LAD and dominant circumflex in December 2015, diffusely diseased small RCA), peripheral artery disease with prior bypass, status post right BKA, pacemaker implantation secondary to complete heart block (implanted 10/07/2014), former smoker, insulin-dependent diabetes mellitus, alcohol use, hemodialysis, obesity due to excess calories.  Patient presents to the office accompanied by [name].  Formally under the care of Jeri Lager, APRN, FNP-C and  reestablish care with myself back in June 2021.   At the last office visit patient was noted to have nonsustained ventricular tachycardia noted on his pacemaker. He was recommended to undergo stress test and echocardiogram for further evaluation because he has multiple cardiovascular risk factors.  Patient was requested to come in sooner than his scheduled appointment to discuss his abnormal nuclear stress test.  Most recent nuclear stress test notes reversible ischemia and LVEF per gated SPECT is reduced.  Patient states that he was recently hospitalized for low hemoglobin.  He has undergone extensive GI evaluation including EGD, colonoscopy, and capsule study.  ALLERGIES: Allergies    Allergen Reactions  . Cytoxan [Cyclophosphamide]     pancytopenia  . Keflex [Cephalexin] Nausea And Vomiting  . Pletal [Cilostazol] Palpitations    "Can hear heart beating loudly".   MEDICATION LIST PRIOR TO VISIT: Current Outpatient Medications on File Prior to Visit  Medication Sig Dispense Refill  . ACCU-CHEK FASTCLIX LANCETS MISC Check blood sugar TID & QHS 102 each 2  . amLODipine (NORVASC) 10 MG tablet Take 10 mg by mouth daily.     Marland Kitchen apixaban (ELIQUIS) 5 MG TABS tablet Take 1 tablet (5 mg total) by mouth 2 (two) times daily. 180 tablet 0  . atorvastatin (LIPITOR) 20 MG tablet Take 1 tablet by mouth once daily (Patient taking differently: Take 20 mg by mouth daily. ) 90 tablet 2  . blood glucose meter kit and supplies KIT Check blood sugar TID & QHS 1 each 0  . Blood Glucose Monitoring Suppl (ACCU-CHEK ADVANTAGE DIABETES) kit Use as instructed 1 each 0  . glucose blood (ACCU-CHEK AVIVA PLUS) test strip Use as instructed 100 each 12  . glucose blood (CHOICE DM FORA G20 TEST STRIPS) test strip Check blood sugar TID & QHS 100 each 12  . glucose blood test strip Use as instructed 100 each 12  . hydrALAZINE (APRESOLINE) 100 MG tablet TAKE 1 TABLET BY MOUTH THREE TIMES DAILY (Patient taking differently: Take 100 mg by mouth 3 (three) times daily. ) 270 tablet 0  . insulin NPH-regular Human (70-30) 100 UNIT/ML injection Inject 25 Units into the skin 2 (two) times daily with a meal. 10 mL 11  . metoprolol tartrate (LOPRESSOR) 100 MG tablet Take 100 mg by mouth 2 (two) times daily.    . Multiple Vitamin (MULTIVITAMIN ADULT) TABS Take 1 tablet  by mouth daily.    . prazosin (MINIPRESS) 2 MG capsule Take 1 capsule (2 mg total) by mouth daily after supper. 90 capsule 1   No current facility-administered medications on file prior to visit.    PAST MEDICAL HISTORY: Past Medical History:  Diagnosis Date  . Alcohol abuse   . Anemia   . Arthritis    "patient does not think so."  . Atrial  fibrillation ([place])   . Cardiac syncope 10/07/14   rapid A fib with 8 sec pauses on converison with syncope- temp pacing wire placed then PPM  . Cataract    BILATERAL  . Chronic kidney disease   . Coronary artery disease   . Diabetes mellitus    dx---been  awhile  . Encounter for care of pacemaker 10/08/2014  . History of blood transfusion   . Hyperlipemia   . Hypertension   . Osteomyelitis ([place])    right foot  . Peripheral vascular disease ([place])   . Presence of permanent cardiac pacemaker 10/08/2014  . Sinus node dysfunction ([place]) 10/08/2014    PAST SURGICAL HISTORY: Past Surgical History:  Procedure Laterality Date  . ABDOMINAL AORTOGRAM W/LOWER EXTREMITY N/A 09/09/2017   Procedure: ABDOMINAL AORTOGRAM W/LOWER EXTREMITY;  Surgeon: Angelia Mould, MD;  Location: [place] [facility];  Service: Cardiovascular;  Laterality: N/A;  . ABDOMINAL AORTOGRAM W/LOWER EXTREMITY Right 11/11/2019   Procedure: ABDOMINAL AORTOGRAM W/LOWER EXTREMITY;  Surgeon: Marty Heck, MD;  Location: [place] [facility];  Service: Cardiovascular;  Laterality: Right;  . AMPUTATION Left 08/19/2013   Procedure: AMPUTATION RAY;  Surgeon: Alta Corning, MD;  Location: [place];  Service: Orthopedics;  Laterality: Left;  ray amputation left 5th  . AMPUTATION Left 10/27/2013   Procedure: AMPUTATION DIGIT-LEFT 4TH TOE, 4th and 5th metatarsal.;  Surgeon: Angelia Mould, MD;  Location: [place];  Service: Vascular;  Laterality: Left;  . AMPUTATION Left 11/04/2013   Procedure: LEFT FOOT TRANS-METATARSAL AMPUTATION WITH WOUND CLOSURE ;  Surgeon: Alta Corning, MD;  Location: [place];  Service: Orthopedics;  Laterality: Left;  . AMPUTATION Right 09/10/2017   Procedure: RIGHT FOURTH AND FIFTH TOE AMPUTATION;  Surgeon: Angelia Mould, MD;  Location: [place];  Service: Vascular;  Laterality: Right;  . AMPUTATION Right 02/19/2020   Procedure: AMPUTATION RIGHT 3RD TOE;  Surgeon: Angelia Mould, MD;   Location: [place];  Service: Vascular;  Laterality: Right;  . AMPUTATION Right 03/11/2020   Procedure: RIGHT BELOW KNEE AMPUTATION;  Surgeon: Newt Minion, MD;  Location: [place];  Service: Orthopedics;  Laterality: Right;  . APPLICATION OF WOUND VAC Right 02/19/2020   Procedure: Application Of Wound Vac Right foot;  Surgeon: Angelia Mould, MD;  Location: [place];  Service: Vascular;  Laterality: Right;  . BIOPSY  07/20/2020   Procedure: BIOPSY;  Surgeon: Rush Landmark Telford Nab., MD;  Location: [place];  Service: Gastroenterology;;  . CARDIAC CATHETERIZATION  10/07/2014   Procedure: LEFT HEART CATH AND CORONARY ANGIOGRAPHY;  Surgeon: Laverda Page, MD;  Location: [facility];  Service: Cardiovascular;;  . COLONOSCOPY W/ POLYPECTOMY    . COLONOSCOPY WITH PROPOFOL N/A 05/25/2020   Procedure: COLONOSCOPY WITH PROPOFOL;  Surgeon: Gatha Mayer, MD;  Location: [facility];  Service: Endoscopy;  Laterality: N/A;  . EMBOLECTOMY Right 09/12/2017   Procedure: Thrombectomy  & Redo Right Below Knee Popliteal Artery Bypass Graft  ;  Surgeon: Waynetta Sandy, MD;  Location: [place];  Service: Vascular;  Laterality:  Right;  Marland Kitchen ENTEROSCOPY N/A 07/20/2020   Procedure: ENTEROSCOPY;  Surgeon: Mansouraty, Telford Nab., MD;  Location: [place];  Service: Gastroenterology;  Laterality: N/A;  . ESOPHAGOGASTRODUODENOSCOPY (EGD) WITH PROPOFOL N/A 05/24/2020   Procedure: ESOPHAGOGASTRODUODENOSCOPY (EGD) WITH PROPOFOL;  Surgeon: Ladene Artist, MD;  Location: [facility];  Service: Endoscopy;  Laterality: N/A;  . EYE SURGERY Bilateral    cataract  . FEMORAL-TIBIAL BYPASS GRAFT Left 10/27/2013   Procedure: BYPASS GRAFT LEFT FEMORAL- POSTERIOR TIBIAL ARTERY;  Surgeon: Angelia Mould, MD;  Location: [place];  Service: Vascular;  Laterality: Left;  . FEMORAL-TIBIAL BYPASS GRAFT Right 09/10/2017   Procedure: RIGHT SUPERFICIAL  FEMORAL ARTERY-BELOW KNEE POPLITEAL ARTERY BYPASS GRAFT WITH VEIN;   Surgeon: Angelia Mould, MD;  Location: [place];  Service: Vascular;  Laterality: Right;  . GIVENS CAPSULE STUDY N/A 07/20/2020   Procedure: GIVENS CAPSULE STUDY;  Surgeon: Irving Copas., MD;  Location: [place];  Service: Gastroenterology;  Laterality: N/A;  capsule placed through scope into duodenum @ 0936  . I & D EXTREMITY Left 10/27/2013   Procedure: IRRIGATION AND DEBRIDEMENT EXTREMITY- LEFT FOOT;  Surgeon: Angelia Mould, MD;  Location: [place];  Service: Vascular;  Laterality: Left;  . I & D EXTREMITY Right 01/26/2020   Procedure: IRRIGATION AND DEBRIDEMENT EXTREMITY RIGHT FOOT WOUND;  Surgeon: Angelia Mould, MD;  Location: [place];  Service: Vascular;  Laterality: Right;  . INTRAOPERATIVE ARTERIOGRAM Right 09/10/2017   Procedure: INTRA OPERATIVE ARTERIOGRAM;  Surgeon: Angelia Mould, MD;  Location: [place];  Service: Vascular;  Laterality: Right;  . IR ANGIOGRAM FOLLOW UP STUDY  09/09/2017  . IR FLUORO GUIDE CV LINE RIGHT  05/31/2020  . IR US GUIDE VASC ACCESS RIGHT  06/01/2020  . LEFT HEART CATHETERIZATION WITH CORONARY ANGIOGRAM N/A 11/09/2014   Procedure: LEFT HEART CATHETERIZATION WITH CORONARY ANGIOGRAM;  Surgeon: Laverda Page, MD;  Location: [facility] C[facility] LAB;  Service: Cardiovascular;  Laterality: N/A;  . LOWER EXTREMITY ANGIOGRAM Right 11/08/2015   Procedure: Lower Extremity Angiogram;  Surgeon: Serafina Mitchell, MD;  Location: [place] [facility];  Service: Cardiovascular;  Laterality: Right;  . LUMBAR LAMINECTOMY  06/13/2012   Procedure: MICRODISCECTOMY LUMBAR LAMINECTOMY;  Surgeon: Jessy Oto, MD;  Location: [place];  Service: Orthopedics;  Laterality: N/A;  Central laminectomy L2-3, L3-4, L4-5  . PACEMAKER INSERTION  10/08/14   MDT Advisa MRI compatible dual chamber pacemaker implanted by Dr Caryl Comes for syncope with post-termination pauses  . PERCUTANEOUS CORONARY STENT INTERVENTION (PCI-S)  11/09/2014   des to lad & distal circumflex         Dr   [name]  . PERIPHERAL VASCULAR BALLOON ANGIOPLASTY Right 11/11/2019   Procedure: PERIPHERAL VASCULAR BALLOON ANGIOPLASTY;  Surgeon: Marty Heck, MD;  Location: [place] [facility];  Service: Cardiovascular;  Laterality: Right;  below knee popliteal, tibioperoneal trunk, posterior tibial arteries  . PERIPHERAL VASCULAR CATHETERIZATION N/A 11/08/2015   Procedure: Abdominal Aortogram;  Surgeon: Serafina Mitchell, MD;  Location: [place] [facility];  Service: Cardiovascular;  Laterality: N/A;  . PERIPHERAL VASCULAR CATHETERIZATION Right 11/08/2015   Procedure: Peripheral Vascular Atherectomy;  Surgeon: Serafina Mitchell, MD;  Location: [place] [facility];  Service: Cardiovascular;  Laterality: Right;  . PERIPHERAL VASCULAR CATHETERIZATION N/A 10/10/2016   Procedure: Lower Extremity Angiography;  Surgeon: Waynetta Sandy, MD;  Location: [place] [facility];  Service: Cardiovascular;  Laterality: N/A;  . PERIPHERAL VASCULAR CATHETERIZATION Right 10/10/2016   Procedure: Peripheral Vascular  Atherectomy;  Surgeon: Waynetta Sandy, MD;  Location: [place] [facility];  Service: Cardiovascular;  Laterality: Right;  Popliteal  . PERMANENT PACEMAKER INSERTION N/A 10/08/2014   Procedure: PERMANENT PACEMAKER INSERTION;  Surgeon: Deboraha Sprang, MD;  Location: [facility] C[facility] LAB;  Service: Cardiovascular;  Laterality: N/A;  . SUBMUCOSAL TATTOO INJECTION  07/20/2020   Procedure: SUBMUCOSAL TATTOO INJECTION;  Surgeon: Irving Copas., MD;  Location: [place];  Service: Gastroenterology;;  . TEMPORARY PACEMAKER INSERTION Bilateral 10/07/2014   Procedure: TEMPORARY PACEMAKER INSERTION;  Surgeon: Laverda Page, MD;  Location: [facility] CATH LAB;  Service: Cardiovascular;  Laterality: Bilateral;  . WOUND DEBRIDEMENT Right 02/19/2020   Procedure: DEBRIDEMENT WOUND RIGHT FOOT;  Surgeon: Angelia Mould, MD;  Location: [facility] OR;  Service: Vascular;  Laterality: Right;    FAMILY HISTORY: The patient's  family history includes Breast cancer in his sister; Diabetes in his brother, mother, and sister; Diabetes type II in his brother, mother, sister, and sister; Hypertension in his brother, mother, and sister; Kidney failure in his brother; Liver cancer in his father.   SOCIAL HISTORY:  The patient  reports that he quit smoking about 5 years ago. His smoking use included cigarettes. He has a 45.00 pack-year smoking history. He has never used smokeless tobacco. He reports current alcohol use of about 6.0 standard drinks of alcohol per week. He reports that he does not use drugs.  Review of Systems  Constitutional: Negative for chills and fever.  HENT: Negative for hoarse voice and nosebleeds.   Eyes: Negative for discharge, double vision and pain.  Cardiovascular: Negative for chest pain, claudication, dyspnea on exertion, leg swelling, near-syncope, orthopnea, palpitations, paroxysmal nocturnal dyspnea and syncope.  Respiratory: Negative for hemoptysis and shortness of breath.   Musculoskeletal: Negative for muscle cramps and myalgias.  Gastrointestinal: Negative for abdominal pain, constipation, diarrhea, hematemesis, hematochezia, melena, nausea and vomiting.  Neurological: Negative for dizziness and light-headedness.    PHYSICAL EXAM: Vitals with BMI 07/27/2020 07/23/2020 07/23/2020  Height _0  - -  Weight 270 lbs - -  BMI 76.28 - -  Systolic 315 176 160  Diastolic 60 76 63  Pulse 64 71 65   CONSTITUTIONAL: Appears older than stated age, well-developed and well-nourished. No acute distress.  SKIN: Skin is warm and dry. No rash noted. No cyanosis. No pallor. No jaundice HEAD: Normocephalic and atraumatic.  EYES: No scleral icterus MOUTH/THROAT: Moist oral membranes.  NECK: No JVD present. No thyromegaly noted. No carotid bruits  LYMPHATIC: No visible cervical adenopathy.  CHEST Normal respiratory effort. No intercostal retractions.  Pacemaker in the left infraclavicular region site is  clean dry and intact. Dialysis catheter present anterior chest wall.  LUNGS: Clear to auscultation bilaterally.  No stridor. No wheezes. No rales.  CARDIOVASCULAR: Regular, positive S1-S2, no murmurs rubs or gallops appreciated ABDOMINAL: Soft, nontender, nondistended, positive bowel sounds in all 4 quadrants, no apparent ascites.  EXTREMITIES: Right BKA, +1 pitting edema in the left lower extremity, difficult to palpate dorsalis pedis posterior tibial pulses on the left side, prior surgical site well-healed. HEMATOLOGIC: No significant bruising NEUROLOGIC: Oriented to person, place, and time. Nonfocal. Normal muscle tone.  PSYCHIATRIC: Normal mood and affect. Normal behavior. Cooperative  CARDIAC DATABASE: Pacemaker in situ: S/P Medtronic advica DR (MRI compatible) dual-chamber pacemaker by Dr. Caryl Comes on 10/07/2014 Remote pacemaker check 04/21/20: One VHR episode, EKG shows 21 beat NSVT. 06/010/2021: 12 Sec NSVT. One AHR episode consistent with AF for 00:08:15:50. There was 0.3% cumulative atrial  arrhythmia burden. Normal function.  RA paced 36 and RV paced <0.1%. Patient is on Eliquis. H/O prior NSVT. Will consider stress testing.  Unscheduled (Alert)  06/15/20: The patient experienced a recurrence of atrial fibrillation, persisting since 06/03/20 (04:54 am). There were several PAF episodes on 7/20, 7/22 and 06/03/20 prior to the persistent episode, lasting up to 4 hours and 12 minutes in duration. There were 0 high ventricular rate episodes detected. Health trends do not demonstrate significant abnormality. Battery longevity is 4 years. RA pacing is 44.5 %, RV pacing is 8.6 %. Patient on Eliquis.   EKG: 04/22/2020: Normal sinus rhythm, 72 bpm, left axis deviation, left anterior fascicular block, right bundle branch block, poor R wave progression.  No significant change compared to prior ECG dated 10/23/2019.  07/13/2020: Atrial fibrillation, 84 bpm, left axis deviation, right bundle branch block,  left anterior fascicular block. Since last ECG sinus rhythm is transition to A. fib.  Echocardiogram: 03/15/2016: Left ventricle cavity is normal in size. Severe concentric hypertrophy of the left ventricle. Normal global wall motion. Normal diastolic filling pattern. Calculated EF 66%. Left atrial cavity is mildly dilated. Right ventricle cavity is normal in size. Normal right ventricular function. Pacemaker lead/ICD lead noted in the RV. Trace mitral regurgitation. Trace pulmonic regurgitation.  05/24/2020: LVEF 60-65%, no regional wall motion of normalities, severe left ventricular hypertrophy, grade 2 diastolic impairment, mildly elevated PASP, moderately dilated biatrial cavities, mild MR.   Stress Testing:  Lexiscan myoview stress test 01/27/2016: 1. The resting electrocardiogram demonstrated normal sinus rhythm, normal resting conduction, no resting arrhythmias and normal rest repolarization. Stress EKG is non-diagnostic for ischemia as it a pharmacologic stress using Lexiscan. Stress symptoms included dyspnea. 2. The perfusion imaging study demonstrates a moderate-sized defect in the inferior wall extending from the base towards the apex, the defect slightly improved in stress images, suggests soft tissue attenuation. However scar in this region could not excluded. Left ventricular systolic function Calculated by QGS was 72%. This is a low risk study. Clinical correlation is recommended.  Heart Catheterization: Coronary angiogram 11/09/2014: CSI diamont back atherectomy to Mid LAD and Distal dominant Cx and stenting 3.0x69m and 2.5x160mPromus DES. RCA long stenosis and small vessel (angio 10/07/14) left alone (medical management only). LVEF 60%.   LABORATORY DATA: CBC Latest Ref Rng & Units 07/23/2020 07/22/2020 07/22/2020  WBC 4.0 - 10.5 K/uL 4.5 - 4.6  Hemoglobin 13.0 - 17.0 g/dL 9.0(L) - 8.3(L)  Hematocrit 39 - 52 % 27.9(L) - 25.5(L)  Platelets 150 - 400 K/uL 135(L) 126(L) 119(L)      CMP Latest Ref Rng & Units 07/21/2020 07/20/2020 07/19/2020  Glucose 70 - 99 mg/dL 140(H) 155(H) 173(H)  BUN 8 - 23 mg/dL 23 19 35(H)  Creatinine 0.61 - 1.24 mg/dL 5.06(H) 4.05(H) 5.66(H)  Sodium 135 - 145 mmol/L 134(L) 138 137  Potassium 3.5 - 5.1 mmol/L 4.2 4.3 4.1  Chloride 98 - 111 mmol/L 99 103 98  CO2 22 - 32 mmol/L _0 Calcium 8.9 - 10.3 mg/dL 8.4(L) 8.3(L) 8.8(L)  Total Protein 6.5 - 8.1 g/dL - - -  Total Bilirubin 0.3 - 1.2 mg/dL - - -  Alkaline Phos 38 - 126 U/L - - -  AST 15 - 41 U/L - - -  ALT 0 - 44 U/L - - -    Lipid Panel     Component Value Date/Time   CHOL 147 11/20/2013 1422   TRIG 160 (H) 11/20/2013 1422   HDL 30 (  L) 11/20/2013 1422   CHOLHDL 4.9 11/20/2013 1422   VLDL 32 11/20/2013 1422   LDLCALC 85 11/20/2013 1422    Lab Results  Component Value Date   HGBA1C 5.5 05/23/2020   HGBA1C 8.6 (H) 01/27/2020   HGBA1C 6.5 (H) 09/10/2017   No components found for: NTPROBNP Lab Results  Component Value Date   TSH 1.480 10/08/2014   TSH 4.760 (H) 05/01/2014   TSH 2.048 10/21/2013    Cardiac Panel (last 3 results) No results for input(s): CKTOTAL, CKMB, TROPONINIHS, RELINDX in the last 72 hours.  IMPRESSION:    ICD-10-CM   1. Abnormal nuclear stress test  R94.39 CBC    BASIC METABOLIC PANEL WITH GFR    SARS-COV-2 RNA,(COVID-19) QUAL NAAT  2. Atherosclerosis of native coronary artery of native heart without angina pectoris  I25.10 isosorbide mononitrate (IMDUR) 30 MG 24 hr tablet    CBC    BASIC METABOLIC PANEL WITH GFR    SARS-COV-2 RNA,(COVID-19) QUAL NAAT  3. Essential hypertension, benign  I10 isosorbide mononitrate (IMDUR) 30 MG 24 hr tablet     RECOMMENDATIONS: JERMAL DISMUKE is a 59 y.o. male whose past medical history and cardiovascular risk factors include: Paroxysmal atrial fibrillation, on oral anticoagulation, coronary artery disease with prior intervention, peripheral artery disease with prior bypass, status post right BKA, pacemaker  implantation secondary to complete heart block (implanted 10/07/2014), former smoker, insulin-dependent diabetes mellitus, increased alcohol use, obesity due to excess calories.  Abnormal nuclear stress test:  Nuclear stress test was performed due to his multiple episodes of nonsustained ventricular tachycardia noted on pacemaker interrogation.  Nuclear stress test was concerning for reversible ischemia in the LAD/LCx distribution.  Given his multiple cardiovascular risk factors and abnormal nuclear stress test and recurrent NSVT recommended left heart catheterization with possible intervention.  Patient is wheelchair-bound and therefore difficult to assess effort related symptoms.  The procedure of left heart catheterization with possible intervention was explained to the patient in detail.   The indication, alternatives, risks and benefits were reviewed.   Complications include but not limited to bleeding, infection, vascular injury, stroke, myocardial infection, arrhythmia, kidney injury, radiation-related injury in the case of prolonged fluoroscopy use, emergency cardiac surgery, and death. The patient understands the risks of serious complication is 1-2 in 5053 with diagnostic cardiac cath and 1-2% or less with angioplasty/stenting.     The patient and her daughter voices understanding and provides verbal feedback and wishes to proceed with coronary angiography with possible PCI.  Nonsustained ventricular tachycardia:  Most recent pacemaker interrogation reviewed with the patient and his daughter.   Continue Lopressor 100 mg p.o. twice daily  Paroxysmal atrial fibrillation . Rate control: Metoprolol . Rhythm control: N/A . Thromboembolic prophylaxis: Eliquis . CHA2DS2-VASc SCORE is 3 which correlates to 3.2% risk of stroke per year (hypertension, diabetes, vascular disease). . Patient does not endorse any evidence of bleeding. Marland Kitchen Refilled Eliquis.  Long-term oral  anticoagulation:  Patient does not endorse any evidence of bleeding  Reemphasized the risk of, benefits, and alternatives of oral anticoagulation.  Patient verbalized understanding.  Established coronary artery disease with prior stenting without angina pectoris:   See above  Imdur refilled.  Pacemaker in situ: Most recent pacemaker interrogation report reviewed with the patient as discussed above.  Educated on importance of improving his modifiable cardiovascular risk factors which include but not limited to: Decreasing alcohol consumption to no more than 2 drinks per day, blood pressure management, lipid management, and glycemic control.  Patient verbalizes understanding.  FINAL MEDICATION LIST END OF ENCOUNTER:   Current Outpatient Medications:  .  ACCU-CHEK FASTCLIX LANCETS MISC, Check blood sugar TID & QHS, Disp: 102 each, Rfl: 2 .  amLODipine (NORVASC) 10 MG tablet, Take 10 mg by mouth daily. , Disp: , Rfl:  .  apixaban (ELIQUIS) 5 MG TABS tablet, Take 1 tablet (5 mg total) by mouth 2 (two) times daily., Disp: 180 tablet, Rfl: 0 .  atorvastatin (LIPITOR) 20 MG tablet, Take 1 tablet by mouth once daily (Patient taking differently: Take 20 mg by mouth daily. ), Disp: 90 tablet, Rfl: 2 .  blood glucose meter kit and supplies KIT, Check blood sugar TID & QHS, Disp: 1 each, Rfl: 0 .  Blood Glucose Monitoring Suppl (ACCU-CHEK ADVANTAGE DIABETES) kit, Use as instructed, Disp: 1 each, Rfl: 0 .  glucose blood (ACCU-CHEK AVIVA PLUS) test strip, Use as instructed, Disp: 100 each, Rfl: 12 .  glucose blood (CHOICE DM FORA G20 TEST STRIPS) test strip, Check blood sugar TID & QHS, Disp: 100 each, Rfl: 12 .  glucose blood test strip, Use as instructed, Disp: 100 each, Rfl: 12 .  hydrALAZINE (APRESOLINE) 100 MG tablet, TAKE 1 TABLET BY MOUTH THREE TIMES DAILY (Patient taking differently: Take 100 mg by mouth 3 (three) times daily. ), Disp: 270 tablet, Rfl: 0 .  insulin NPH-regular Human (70-30)  100 UNIT/ML injection, Inject 25 Units into the skin 2 (two) times daily with a meal., Disp: 10 mL, Rfl: 11 .  metoprolol tartrate (LOPRESSOR) 100 MG tablet, Take 100 mg by mouth 2 (two) times daily., Disp: , Rfl:  .  Multiple Vitamin (MULTIVITAMIN ADULT) TABS, Take 1 tablet by mouth daily., Disp: , Rfl:  .  prazosin (MINIPRESS) 2 MG capsule, Take 1 capsule (2 mg total) by mouth daily after supper., Disp: 90 capsule, Rfl: 1 .  isosorbide mononitrate (IMDUR) 30 MG 24 hr tablet, Take 1 tablet (30 mg total) by mouth daily., Disp: 90 tablet, Rfl: 0  Orders Placed This Encounter  Procedures  . SARS-COV-2 RNA,(COVID-19) QUAL NAAT  . CBC  . BASIC METABOLIC PANEL WITH GFR   --Continue cardiac medications as reconciled in final medication list. --Return in about 4 weeks (around 08/24/2020) for Post heart catheterization. Or sooner if needed. --Continue follow-up with your primary care physician regarding the management of your other chronic comorbid conditions.  Patient's questions and concerns were addressed to his satisfaction. He voices understanding of the instructions provided during this encounter.   This note was created using a voice recognition software as a result there may be grammatical errors inadvertently enclosed that do not reflect the nature of this encounter. Every attempt is made to correct such [email or web address]   Rex Kras, [place], [facility]  Pager: [phone] Office: [phone]

## 2020-07-27 NOTE — H&P (View-Only) (Signed)
 Alexander [name] Date of Birth: 04/06/1961 MRN: [id number] Primary Care Provider:Tisovec, Richard W, MD Former Cardiology Providers: Dr. Jay Ganji, Ashton Kelley, APRN, FNP-C Primary Cardiologist: Tuff Clabo, DO, FACC (established care 04/22/2020) Electrophysiologist: Dr. Gregg Taylor  Date: 07/27/2020 Last Office Visit: 07/13/2020  Chief Complaint  Patient presents with  . Follow-up    abnormal stress test     HPI  Alexander [name] is a 59 y.o.  male who presents to the office with a chief complaint of " abnormal stress test." Patient has a complex past medical history and multiple cardiovascular risk factors which include but not limited to: Paroxysmal atrial fibrillation, on oral anticoagulation, coronary artery disease ( angioplasty and stenting to the LAD and dominant circumflex in December 2015, diffusely diseased small RCA), peripheral artery disease with prior bypass, status post right BKA, pacemaker implantation secondary to complete heart block (implanted 10/07/2014), former smoker, insulin-dependent diabetes mellitus, alcohol use, hemodialysis, obesity due to excess calories.  Patient presents to the office accompanied by [name].  Formally under the care of Ashton Kelley, APRN, FNP-C and  reestablish care with myself back in June 2021.   At the last office visit patient was noted to have nonsustained ventricular tachycardia noted on his pacemaker. He was recommended to undergo stress test and echocardiogram for further evaluation because he has multiple cardiovascular risk factors.  Patient was requested to come in sooner than his scheduled appointment to discuss his abnormal nuclear stress test.  Most recent nuclear stress test notes reversible ischemia and LVEF per gated SPECT is reduced.  Patient states that he was recently hospitalized for low hemoglobin.  He has undergone extensive GI evaluation including EGD, colonoscopy, and capsule study.  ALLERGIES: Allergies    Allergen Reactions  . Cytoxan [Cyclophosphamide]     pancytopenia  . Keflex [Cephalexin] Nausea And Vomiting  . Pletal [Cilostazol] Palpitations    "Can hear heart beating loudly".   MEDICATION LIST PRIOR TO VISIT: Current Outpatient Medications on File Prior to Visit  Medication Sig Dispense Refill  . ACCU-CHEK FASTCLIX LANCETS MISC Check blood sugar TID & QHS 102 each 2  . amLODipine (NORVASC) 10 MG tablet Take 10 mg by mouth daily.     . apixaban (ELIQUIS) 5 MG TABS tablet Take 1 tablet (5 mg total) by mouth 2 (two) times daily. 180 tablet 0  . atorvastatin (LIPITOR) 20 MG tablet Take 1 tablet by mouth once daily (Patient taking differently: Take 20 mg by mouth daily. ) 90 tablet 2  . blood glucose meter kit and supplies KIT Check blood sugar TID & QHS 1 each 0  . Blood Glucose Monitoring Suppl (ACCU-CHEK ADVANTAGE DIABETES) kit Use as instructed 1 each 0  . glucose blood (ACCU-CHEK AVIVA PLUS) test strip Use as instructed 100 each 12  . glucose blood (CHOICE DM FORA G20 TEST STRIPS) test strip Check blood sugar TID & QHS 100 each 12  . glucose blood test strip Use as instructed 100 each 12  . hydrALAZINE (APRESOLINE) 100 MG tablet TAKE 1 TABLET BY MOUTH THREE TIMES DAILY (Patient taking differently: Take 100 mg by mouth 3 (three) times daily. ) 270 tablet 0  . insulin NPH-regular Human (70-30) 100 UNIT/ML injection Inject 25 Units into the skin 2 (two) times daily with a meal. 10 mL 11  . metoprolol tartrate (LOPRESSOR) 100 MG tablet Take 100 mg by mouth 2 (two) times daily.    . Multiple Vitamin (MULTIVITAMIN ADULT) TABS Take 1 tablet   by mouth daily.    . prazosin (MINIPRESS) 2 MG capsule Take 1 capsule (2 mg total) by mouth daily after supper. 90 capsule 1   No current facility-administered medications on file prior to visit.    PAST MEDICAL HISTORY: Past Medical History:  Diagnosis Date  . Alcohol abuse   . Anemia   . Arthritis    "patient does not think so."  . Atrial  fibrillation (HCC)   . Cardiac syncope 10/07/14   rapid A fib with 8 sec pauses on converison with syncope- temp pacing wire placed then PPM  . Cataract    BILATERAL  . Chronic kidney disease   . Coronary artery disease   . Diabetes mellitus    dx---been  awhile  . Encounter for care of pacemaker 10/08/2014  . History of blood transfusion   . Hyperlipemia   . Hypertension   . Osteomyelitis (HCC)    right foot  . Peripheral vascular disease (HCC)   . Presence of permanent cardiac pacemaker 10/08/2014  . Sinus node dysfunction (HCC) 10/08/2014    PAST SURGICAL HISTORY: Past Surgical History:  Procedure Laterality Date  . ABDOMINAL AORTOGRAM W/LOWER EXTREMITY N/A 09/09/2017   Procedure: ABDOMINAL AORTOGRAM W/LOWER EXTREMITY;  Surgeon: Dickson, [name], MD;  [facility]: [facility];  Service: Cardiovascular;  Laterality: N/A;  . ABDOMINAL AORTOGRAM W/LOWER EXTREMITY Right 11/11/2019   Procedure: ABDOMINAL AORTOGRAM W/LOWER EXTREMITY;  Surgeon: Clark, Christopher J, MD;  [facility]: [facility];  Service: Cardiovascular;  Laterality: Right;  . AMPUTATION Left 08/19/2013   Procedure: AMPUTATION RAY;  Surgeon: [name] Graves, MD;  Location: [facility];  Service: Orthopedics;  Laterality: Left;  ray amputation left 5th  . AMPUTATION Left 10/27/2013   Procedure: AMPUTATION DIGIT-LEFT 4TH TOE, 4th and 5th metatarsal.;  Surgeon: [name] Dickson, MD;  Location: [facility];  Service: Vascular;  Laterality: Left;  . AMPUTATION Left 11/04/2013   Procedure: LEFT FOOT TRANS-METATARSAL AMPUTATION WITH WOUND CLOSURE ;  Surgeon: [name] Graves, MD;  Location: [facility];  Service: Orthopedics;  Laterality: Left;  . AMPUTATION Right 09/10/2017   Procedure: RIGHT FOURTH AND FIFTH TOE AMPUTATION;  Surgeon: Dickson, [name], MD;  [facility]: [facility];  Service: Vascular;  Laterality: Right;  . AMPUTATION Right 02/19/2020   Procedure: AMPUTATION RIGHT 3RD TOE;  Surgeon: Dickson, [name], MD;   [facility];  Service: Vascular;  Laterality: Right;  . AMPUTATION Right 03/11/2020   Procedure: RIGHT BELOW KNEE AMPUTATION;  Surgeon: Duda, Marcus V, MD;  [facility]: [facility];  Service: Orthopedics;  Laterality: Right;  . APPLICATION OF WOUND VAC Right 02/19/2020   Procedure: Application Of Wound Vac Right foot;  Surgeon: Dickson, [name], MD;  [facility];  Service: Vascular;  Laterality: Right;  . BIOPSY  07/20/2020   Procedure: BIOPSY;  Surgeon: Mansouraty, Gabriel Jr., MD;  [facility];  Service: Gastroenterology;;  . CARDIAC CATHETERIZATION  10/07/2014   Procedure: LEFT HEART CATH AND CORONARY ANGIOGRAPHY;  Surgeon: Jagadeesh R Ganji, MD;  [facility]: [facility];  Service: Cardiovascular;;  . COLONOSCOPY W/ POLYPECTOMY    . COLONOSCOPY WITH PROPOFOL N/A 05/25/2020   Procedure: COLONOSCOPY WITH PROPOFOL;  Surgeon: Gessner, Carl E, MD;  Location: [facility];  Service: Endoscopy;  Laterality: N/A;  . EMBOLECTOMY Right 09/12/2017   Procedure: Thrombectomy  & Redo Right Below Knee Popliteal Artery Bypass Graft  ;  Surgeon: Cain, Brandon Christopher, MD;  [facility];  Service: Vascular;  Laterality:   Right;  . ENTEROSCOPY N/A 07/20/2020   Procedure: ENTEROSCOPY;  Surgeon: Mansouraty, Gabriel Jr., MD;  [facility];  Service: Gastroenterology;  Laterality: N/A;  . ESOPHAGOGASTRODUODENOSCOPY (EGD) WITH PROPOFOL N/A 05/24/2020   Procedure: ESOPHAGOGASTRODUODENOSCOPY (EGD) WITH PROPOFOL;  Surgeon: Stark, Malcolm T, MD;  Location: [facility];  Service: Endoscopy;  Laterality: N/A;  . EYE SURGERY Bilateral    cataract  . FEMORAL-TIBIAL BYPASS GRAFT Left 10/27/2013   Procedure: BYPASS GRAFT LEFT FEMORAL- POSTERIOR TIBIAL ARTERY;  Surgeon: [name] Dickson, MD;  [facility];  Service: Vascular;  Laterality: Left;  . FEMORAL-TIBIAL BYPASS GRAFT Right 09/10/2017   Procedure: RIGHT SUPERFICIAL  FEMORAL ARTERY-BELOW KNEE POPLITEAL ARTERY BYPASS GRAFT WITH VEIN;   Surgeon: Dickson, [name], MD;  [facility];  Service: Vascular;  Laterality: Right;  . GIVENS CAPSULE STUDY N/A 07/20/2020   Procedure: GIVENS CAPSULE STUDY;  Surgeon: Mansouraty, Gabriel Jr., MD;  [facility];  Service: Gastroenterology;  Laterality: N/A;  capsule placed through scope into duodenum @ 0936  . I & D EXTREMITY Left 10/27/2013   Procedure: IRRIGATION AND DEBRIDEMENT EXTREMITY- LEFT FOOT;  Surgeon: [name] Dickson, MD;  [facility];  Service: Vascular;  Laterality: Left;  . I & D EXTREMITY Right 01/26/2020   Procedure: IRRIGATION AND DEBRIDEMENT EXTREMITY RIGHT FOOT WOUND;  Surgeon: Dickson, [name], MD;  [facility];  Service: Vascular;  Laterality: Right;  . INTRAOPERATIVE ARTERIOGRAM Right 09/10/2017   Procedure: INTRA OPERATIVE ARTERIOGRAM;  Surgeon: Dickson, [name], MD;  [facility];  Service: Vascular;  Laterality: Right;  . IR ANGIOGRAM FOLLOW UP STUDY  09/09/2017  . IR FLUORO GUIDE CV LINE RIGHT  05/31/2020  . IR US GUIDE VASC ACCESS RIGHT  06/01/2020  . LEFT HEART CATHETERIZATION WITH CORONARY ANGIOGRAM N/A 11/09/2014   Procedure: LEFT HEART CATHETERIZATION WITH CORONARY ANGIOGRAM;  Surgeon: Jagadeesh R Ganji, MD;  [facility];  Service: Cardiovascular;  Laterality: N/A;  . LOWER EXTREMITY ANGIOGRAM Right 11/08/2015   Procedure: Lower Extremity Angiogram;  Surgeon: Vance W Brabham, MD;  [facility];  Service: Cardiovascular;  Laterality: Right;  . LUMBAR LAMINECTOMY  06/13/2012   Procedure: MICRODISCECTOMY LUMBAR LAMINECTOMY;  Surgeon: James E Nitka, MD;  [facility];  Service: Orthopedics;  Laterality: N/A;  Central laminectomy L2-3, L3-4, L4-5  . PACEMAKER INSERTION  10/08/14   MDT Advisa MRI compatible dual chamber pacemaker implanted by Dr Klein for syncope with post-termination pauses  . PERCUTANEOUS CORONARY STENT INTERVENTION (PCI-S)  11/09/2014   des to lad & distal circumflex         Dr   Ganji  . PERIPHERAL VASCULAR BALLOON ANGIOPLASTY Right 11/11/2019   Procedure: PERIPHERAL VASCULAR BALLOON ANGIOPLASTY;  Surgeon: Clark, Christopher J, MD;  [facility];  Service: Cardiovascular;  Laterality: Right;  below knee popliteal, tibioperoneal trunk, posterior tibial arteries  . PERIPHERAL VASCULAR CATHETERIZATION N/A 11/08/2015   Procedure: Abdominal Aortogram;  Surgeon: Vance W Brabham, MD;  [facility];  Service: Cardiovascular;  Laterality: N/A;  . PERIPHERAL VASCULAR CATHETERIZATION Right 11/08/2015   Procedure: Peripheral Vascular Atherectomy;  Surgeon: Vance W Brabham, MD;  [facility];  Service: Cardiovascular;  Laterality: Right;  . PERIPHERAL VASCULAR CATHETERIZATION N/A 10/10/2016   Procedure: Lower Extremity Angiography;  Surgeon: Brandon Christopher Cain, MD;  [facility];  Service: Cardiovascular;  Laterality: N/A;  . PERIPHERAL VASCULAR CATHETERIZATION Right 10/10/2016   Procedure: Peripheral Vascular   Atherectomy;  Surgeon: Brandon Christopher Cain, MD;  [facility];  Service: Cardiovascular;  Laterality: Right;  Popliteal  . PERMANENT PACEMAKER INSERTION N/A 10/08/2014   Procedure: PERMANENT PACEMAKER INSERTION;  Surgeon: Steven C Klein, MD;  [facility];  Service: Cardiovascular;  Laterality: N/A;  . SUBMUCOSAL TATTOO INJECTION  07/20/2020   Procedure: SUBMUCOSAL TATTOO INJECTION;  Surgeon: Mansouraty, Gabriel Jr., MD;  [facility];  Service: Gastroenterology;;  . TEMPORARY PACEMAKER INSERTION Bilateral 10/07/2014   Procedure: TEMPORARY PACEMAKER INSERTION;  Surgeon: Jagadeesh R Ganji, MD;  [facility];  Service: Cardiovascular;  Laterality: Bilateral;  . WOUND DEBRIDEMENT Right 02/19/2020   Procedure: DEBRIDEMENT WOUND RIGHT FOOT;  Surgeon: Dickson, [name], MD;  [facility];  Service: Vascular;  Laterality: Right;    FAMILY HISTORY: The patient's  family history includes Breast cancer in his sister; Diabetes in his brother, mother, and sister; Diabetes type II in his brother, mother, sister, and sister; Hypertension in his brother, mother, and sister; Kidney failure in his brother; Liver cancer in his father.   SOCIAL HISTORY:  The patient  reports that he quit smoking about 5 years ago. His smoking use included cigarettes. He has a 45.00 pack-year smoking history. He has never used smokeless tobacco. He reports current alcohol use of about 6.0 standard drinks of alcohol per week. He reports that he does not use drugs.  Review of Systems  Constitutional: Negative for chills and fever.  HENT: Negative for hoarse voice and nosebleeds.   Eyes: Negative for discharge, double vision and pain.  Cardiovascular: Negative for chest pain, claudication, dyspnea on exertion, leg swelling, near-syncope, orthopnea, palpitations, paroxysmal nocturnal dyspnea and syncope.  Respiratory: Negative for hemoptysis and shortness of breath.   Musculoskeletal: Negative for muscle cramps and myalgias.  Gastrointestinal: Negative for abdominal pain, constipation, diarrhea, hematemesis, hematochezia, melena, nausea and vomiting.  Neurological: Negative for dizziness and light-headedness.    PHYSICAL EXAM: Vitals with BMI 07/27/2020 07/23/2020 07/23/2020  Height 6' 0" - -  Weight 270 lbs - -  BMI 36.61 - -  Systolic 142 152 145  Diastolic 60 76 63  Pulse 64 71 65   CONSTITUTIONAL: Appears older than stated age, well-developed and well-nourished. No acute distress.  SKIN: Skin is warm and dry. No rash noted. No cyanosis. No pallor. No jaundice HEAD: Normocephalic and atraumatic.  EYES: No scleral icterus MOUTH/THROAT: Moist oral membranes.  NECK: No JVD present. No thyromegaly noted. No carotid bruits  LYMPHATIC: No visible cervical adenopathy.  CHEST Normal respiratory effort. No intercostal retractions.  Pacemaker in the left infraclavicular region site is  clean dry and intact. Dialysis catheter present anterior chest wall.  LUNGS: Clear to auscultation bilaterally.  No stridor. No wheezes. No rales.  CARDIOVASCULAR: Regular, positive S1-S2, no murmurs rubs or gallops appreciated ABDOMINAL: Soft, nontender, nondistended, positive bowel sounds in all 4 quadrants, no apparent ascites.  EXTREMITIES: Right BKA, +1 pitting edema in the left lower extremity, difficult to palpate dorsalis pedis posterior tibial pulses on the left side, prior surgical site well-healed. HEMATOLOGIC: No significant bruising NEUROLOGIC: Oriented to person, place, and time. Nonfocal. Normal muscle tone.  PSYCHIATRIC: Normal mood and affect. Normal behavior. Cooperative  CARDIAC DATABASE: Pacemaker in situ: S/P Medtronic advica DR (MRI compatible) dual-chamber pacemaker by Dr. Klein on 10/07/2014 Remote pacemaker check 04/21/20: One VHR episode, EKG shows 21 beat NSVT. 06/010/2021: 12 Sec NSVT. One AHR episode consistent with AF for 00:08:15:50. There was 0.3% cumulative atrial   arrhythmia burden. Normal function.  RA paced 36 and RV paced <0.1%. Patient is on Eliquis. H/O prior NSVT. Will consider stress testing.  Unscheduled (Alert)  06/15/20: The patient experienced a recurrence of atrial fibrillation, persisting since 06/03/20 (04:54 am). There were several PAF episodes on 7/20, 7/22 and 06/03/20 prior to the persistent episode, lasting up to 4 hours and 12 minutes in duration. There were 0 high ventricular rate episodes detected. Health trends do not demonstrate significant abnormality. Battery longevity is 4 years. RA pacing is 44.5 %, RV pacing is 8.6 %. Patient on Eliquis.   EKG: 04/22/2020: Normal sinus rhythm, 72 bpm, left axis deviation, left anterior fascicular block, right bundle branch block, poor R wave progression.  No significant change compared to prior ECG dated 10/23/2019.  07/13/2020: Atrial fibrillation, 84 bpm, left axis deviation, right bundle branch block,  left anterior fascicular block. Since last ECG sinus rhythm is transition to A. fib.  Echocardiogram: 03/15/2016: Left ventricle cavity is normal in size. Severe concentric hypertrophy of the left ventricle. Normal global wall motion. Normal diastolic filling pattern. Calculated EF 66%. Left atrial cavity is mildly dilated. Right ventricle cavity is normal in size. Normal right ventricular function. Pacemaker lead/ICD lead noted in the RV. Trace mitral regurgitation. Trace pulmonic regurgitation.  05/24/2020: LVEF 60-65%, no regional wall motion of normalities, severe left ventricular hypertrophy, grade 2 diastolic impairment, mildly elevated PASP, moderately dilated biatrial cavities, mild MR.   Stress Testing:  Lexiscan myoview stress test 01/27/2016: 1. The resting electrocardiogram demonstrated normal sinus rhythm, normal resting conduction, no resting arrhythmias and normal rest repolarization. Stress EKG is non-diagnostic for ischemia as it a pharmacologic stress using Lexiscan. Stress symptoms included dyspnea. 2. The perfusion imaging study demonstrates a moderate-sized defect in the inferior wall extending from the base towards the apex, the defect slightly improved in stress images, suggests soft tissue attenuation. However scar in this region could not excluded. Left ventricular systolic function Calculated by QGS was 72%. This is a low risk study. Clinical correlation is recommended.  Heart Catheterization: Coronary angiogram 11/09/2014: CSI diamont back atherectomy to Mid LAD and Distal dominant Cx and stenting 3.0x28mm and 2.5x16mm Promus DES. RCA long stenosis and small vessel (angio 10/07/14) left alone (medical management only). LVEF 60%.   LABORATORY DATA: CBC Latest Ref Rng & Units 07/23/2020 07/22/2020 07/22/2020  WBC 4.0 - 10.5 K/uL 4.5 - 4.6  Hemoglobin 13.0 - 17.0 g/dL 9.0(L) - 8.3(L)  Hematocrit 39 - 52 % 27.9(L) - 25.5(L)  Platelets 150 - 400 K/uL 135(L) 126(L) 119(L)      CMP Latest Ref Rng & Units 07/21/2020 07/20/2020 07/19/2020  Glucose 70 - 99 mg/dL 140(H) 155(H) 173(H)  BUN 8 - 23 mg/dL 23 19 35(H)  Creatinine 0.61 - 1.24 mg/dL 5.06(H) 4.05(H) 5.66(H)  Sodium 135 - 145 mmol/L 134(L) 138 137  Potassium 3.5 - 5.1 mmol/L 4.2 4.3 4.1  Chloride 98 - 111 mmol/L 99 103 98  CO2 22 - 32 mmol/L 27 24 30  Calcium 8.9 - 10.3 mg/dL 8.4(L) 8.3(L) 8.8(L)  Total Protein 6.5 - 8.1 g/dL - - -  Total Bilirubin 0.3 - 1.2 mg/dL - - -  Alkaline Phos 38 - 126 U/L - - -  AST 15 - 41 U/L - - -  ALT 0 - 44 U/L - - -    Lipid Panel     Component Value Date/Time   CHOL 147 11/20/2013 1422   TRIG 160 (H) 11/20/2013 1422   HDL 30 (  L) 11/20/2013 1422   CHOLHDL 4.9 11/20/2013 1422   VLDL 32 11/20/2013 1422   LDLCALC 85 11/20/2013 1422    Lab Results  Component Value Date   HGBA1C 5.5 05/23/2020   HGBA1C 8.6 (H) 01/27/2020   HGBA1C 6.5 (H) 09/10/2017   No components found for: NTPROBNP Lab Results  Component Value Date   TSH 1.480 10/08/2014   TSH 4.760 (H) 05/01/2014   TSH 2.048 10/21/2013    Cardiac Panel (last 3 results) No results for input(s): CKTOTAL, CKMB, TROPONINIHS, RELINDX in the last 72 hours.  IMPRESSION:    ICD-10-CM   1. Abnormal nuclear stress test  R94.39 CBC    BASIC METABOLIC PANEL WITH GFR    SARS-COV-2 RNA,(COVID-19) QUAL NAAT  2. Atherosclerosis of native coronary artery of native heart without angina pectoris  I25.10 isosorbide mononitrate (IMDUR) 30 MG 24 hr tablet    CBC    BASIC METABOLIC PANEL WITH GFR    SARS-COV-2 RNA,(COVID-19) QUAL NAAT  3. Essential hypertension, benign  I10 isosorbide mononitrate (IMDUR) 30 MG 24 hr tablet     RECOMMENDATIONS: Casmer R Kratochvil is a 59 y.o. male whose past medical history and cardiovascular risk factors include: Paroxysmal atrial fibrillation, on oral anticoagulation, coronary artery disease with prior intervention, peripheral artery disease with prior bypass, status post right BKA, pacemaker  implantation secondary to complete heart block (implanted 10/07/2014), former smoker, insulin-dependent diabetes mellitus, increased alcohol use, obesity due to excess calories.  Abnormal nuclear stress test:  Nuclear stress test was performed due to his multiple episodes of nonsustained ventricular tachycardia noted on pacemaker interrogation.  Nuclear stress test was concerning for reversible ischemia in the LAD/LCx distribution.  Given his multiple cardiovascular risk factors and abnormal nuclear stress test and recurrent NSVT recommended left heart catheterization with possible intervention.  Patient is wheelchair-bound and therefore difficult to assess effort related symptoms.  The procedure of left heart catheterization with possible intervention was explained to the patient in detail.   The indication, alternatives, risks and benefits were reviewed.   Complications include but not limited to bleeding, infection, vascular injury, stroke, myocardial infection, arrhythmia, kidney injury, radiation-related injury in the case of prolonged fluoroscopy use, emergency cardiac surgery, and death. The patient understands the risks of serious complication is 1-2 in 1000 with diagnostic cardiac cath and 1-2% or less with angioplasty/stenting.     The patient and her daughter voices understanding and provides verbal feedback and wishes to proceed with coronary angiography with possible PCI.  Nonsustained ventricular tachycardia:  Most recent pacemaker interrogation reviewed with the patient and his daughter.   Continue Lopressor 100 mg p.o. twice daily  Paroxysmal atrial fibrillation . Rate control: Metoprolol . Rhythm control: N/A . Thromboembolic prophylaxis: Eliquis . CHA2DS2-VASc SCORE is 3 which correlates to 3.2% risk of stroke per year (hypertension, diabetes, vascular disease). . Patient does not endorse any evidence of bleeding. . Refilled Eliquis.  Long-term oral  anticoagulation:  Patient does not endorse any evidence of bleeding  Reemphasized the risk of, benefits, and alternatives of oral anticoagulation.  Patient verbalized understanding.  Established coronary artery disease with prior stenting without angina pectoris:   See above  Imdur refilled.  Pacemaker in situ: Most recent pacemaker interrogation report reviewed with the patient as discussed above.  Educated on importance of improving his modifiable cardiovascular risk factors which include but not limited to: Decreasing alcohol consumption to no more than 2 drinks per day, blood pressure management, lipid management, and glycemic control.    Patient verbalizes understanding.  FINAL MEDICATION LIST END OF ENCOUNTER:   Current Outpatient Medications:  .  ACCU-CHEK FASTCLIX LANCETS MISC, Check blood sugar TID & QHS, Disp: 102 each, Rfl: 2 .  amLODipine (NORVASC) 10 MG tablet, Take 10 mg by mouth daily. , Disp: , Rfl:  .  apixaban (ELIQUIS) 5 MG TABS tablet, Take 1 tablet (5 mg total) by mouth 2 (two) times daily., Disp: 180 tablet, Rfl: 0 .  atorvastatin (LIPITOR) 20 MG tablet, Take 1 tablet by mouth once daily (Patient taking differently: Take 20 mg by mouth daily. ), Disp: 90 tablet, Rfl: 2 .  blood glucose meter kit and supplies KIT, Check blood sugar TID & QHS, Disp: 1 each, Rfl: 0 .  Blood Glucose Monitoring Suppl (ACCU-CHEK ADVANTAGE DIABETES) kit, Use as instructed, Disp: 1 each, Rfl: 0 .  glucose blood (ACCU-CHEK AVIVA PLUS) test strip, Use as instructed, Disp: 100 each, Rfl: 12 .  glucose blood (CHOICE DM FORA G20 TEST STRIPS) test strip, Check blood sugar TID & QHS, Disp: 100 each, Rfl: 12 .  glucose blood test strip, Use as instructed, Disp: 100 each, Rfl: 12 .  hydrALAZINE (APRESOLINE) 100 MG tablet, TAKE 1 TABLET BY MOUTH THREE TIMES DAILY (Patient taking differently: Take 100 mg by mouth 3 (three) times daily. ), Disp: 270 tablet, Rfl: 0 .  insulin NPH-regular Human (70-30)  100 UNIT/ML injection, Inject 25 Units into the skin 2 (two) times daily with a meal., Disp: 10 mL, Rfl: 11 .  metoprolol tartrate (LOPRESSOR) 100 MG tablet, Take 100 mg by mouth 2 (two) times daily., Disp: , Rfl:  .  Multiple Vitamin (MULTIVITAMIN ADULT) TABS, Take 1 tablet by mouth daily., Disp: , Rfl:  .  prazosin (MINIPRESS) 2 MG capsule, Take 1 capsule (2 mg total) by mouth daily after supper., Disp: 90 capsule, Rfl: 1 .  isosorbide mononitrate (IMDUR) 30 MG 24 hr tablet, Take 1 tablet (30 mg total) by mouth daily., Disp: 90 tablet, Rfl: 0  Orders Placed This Encounter  Procedures  . SARS-COV-2 RNA,(COVID-19) QUAL NAAT  . CBC  . BASIC METABOLIC PANEL WITH GFR   --Continue cardiac medications as reconciled in final medication list. --Return in about 4 weeks (around 08/24/2020) for Post heart catheterization. Or sooner if needed. --Continue follow-up with your primary care physician regarding the management of your other chronic comorbid conditions.  Patient's questions and concerns were addressed to his satisfaction. He voices understanding of the instructions provided during this encounter.   This note was created using a voice recognition software as a result there may be grammatical errors inadvertently enclosed that do not reflect the nature of this encounter. Every attempt is made to correct such errors.   Kess Mcilwain, DO, FACC  Pager: [phone] Office: [phone]  

## 2020-07-28 DIAGNOSIS — N179 Acute kidney failure, unspecified: Secondary | ICD-10-CM | POA: Diagnosis not present

## 2020-07-29 ENCOUNTER — Encounter: Payer: Self-pay | Admitting: Gastroenterology

## 2020-08-02 DIAGNOSIS — N179 Acute kidney failure, unspecified: Secondary | ICD-10-CM | POA: Diagnosis not present

## 2020-08-04 DIAGNOSIS — D631 Anemia in chronic kidney disease: Secondary | ICD-10-CM | POA: Diagnosis not present

## 2020-08-04 DIAGNOSIS — D509 Iron deficiency anemia, unspecified: Secondary | ICD-10-CM | POA: Diagnosis not present

## 2020-08-04 DIAGNOSIS — N186 End stage renal disease: Secondary | ICD-10-CM | POA: Diagnosis not present

## 2020-08-04 DIAGNOSIS — Z992 Dependence on renal dialysis: Secondary | ICD-10-CM | POA: Diagnosis not present

## 2020-08-04 DIAGNOSIS — T8249XA Other complication of vascular dialysis catheter, initial encounter: Secondary | ICD-10-CM | POA: Diagnosis not present

## 2020-08-04 DIAGNOSIS — N2581 Secondary hyperparathyroidism of renal origin: Secondary | ICD-10-CM | POA: Diagnosis not present

## 2020-08-04 DIAGNOSIS — D689 Coagulation defect, unspecified: Secondary | ICD-10-CM | POA: Diagnosis not present

## 2020-08-04 DIAGNOSIS — Z23 Encounter for immunization: Secondary | ICD-10-CM | POA: Diagnosis not present

## 2020-08-05 ENCOUNTER — Other Ambulatory Visit: Payer: Self-pay | Admitting: Cardiology

## 2020-08-06 DIAGNOSIS — D509 Iron deficiency anemia, unspecified: Secondary | ICD-10-CM | POA: Diagnosis not present

## 2020-08-06 DIAGNOSIS — T8249XA Other complication of vascular dialysis catheter, initial encounter: Secondary | ICD-10-CM | POA: Diagnosis not present

## 2020-08-06 DIAGNOSIS — D631 Anemia in chronic kidney disease: Secondary | ICD-10-CM | POA: Diagnosis not present

## 2020-08-06 DIAGNOSIS — Z23 Encounter for immunization: Secondary | ICD-10-CM | POA: Diagnosis not present

## 2020-08-06 DIAGNOSIS — N2581 Secondary hyperparathyroidism of renal origin: Secondary | ICD-10-CM | POA: Diagnosis not present

## 2020-08-06 DIAGNOSIS — D689 Coagulation defect, unspecified: Secondary | ICD-10-CM | POA: Diagnosis not present

## 2020-08-06 DIAGNOSIS — N186 End stage renal disease: Secondary | ICD-10-CM | POA: Diagnosis not present

## 2020-08-06 DIAGNOSIS — Z992 Dependence on renal dialysis: Secondary | ICD-10-CM | POA: Diagnosis not present

## 2020-08-09 DIAGNOSIS — N186 End stage renal disease: Secondary | ICD-10-CM | POA: Diagnosis not present

## 2020-08-09 DIAGNOSIS — T8249XA Other complication of vascular dialysis catheter, initial encounter: Secondary | ICD-10-CM | POA: Diagnosis not present

## 2020-08-09 DIAGNOSIS — N2581 Secondary hyperparathyroidism of renal origin: Secondary | ICD-10-CM | POA: Diagnosis not present

## 2020-08-09 DIAGNOSIS — Z992 Dependence on renal dialysis: Secondary | ICD-10-CM | POA: Diagnosis not present

## 2020-08-09 DIAGNOSIS — Z23 Encounter for immunization: Secondary | ICD-10-CM | POA: Diagnosis not present

## 2020-08-09 DIAGNOSIS — D689 Coagulation defect, unspecified: Secondary | ICD-10-CM | POA: Diagnosis not present

## 2020-08-09 DIAGNOSIS — D631 Anemia in chronic kidney disease: Secondary | ICD-10-CM | POA: Diagnosis not present

## 2020-08-09 DIAGNOSIS — D509 Iron deficiency anemia, unspecified: Secondary | ICD-10-CM | POA: Diagnosis not present

## 2020-08-11 DIAGNOSIS — T8249XA Other complication of vascular dialysis catheter, initial encounter: Secondary | ICD-10-CM | POA: Diagnosis not present

## 2020-08-11 DIAGNOSIS — N186 End stage renal disease: Secondary | ICD-10-CM | POA: Diagnosis not present

## 2020-08-11 DIAGNOSIS — Z23 Encounter for immunization: Secondary | ICD-10-CM | POA: Diagnosis not present

## 2020-08-11 DIAGNOSIS — D509 Iron deficiency anemia, unspecified: Secondary | ICD-10-CM | POA: Diagnosis not present

## 2020-08-11 DIAGNOSIS — D689 Coagulation defect, unspecified: Secondary | ICD-10-CM | POA: Diagnosis not present

## 2020-08-11 DIAGNOSIS — Z992 Dependence on renal dialysis: Secondary | ICD-10-CM | POA: Diagnosis not present

## 2020-08-11 DIAGNOSIS — D631 Anemia in chronic kidney disease: Secondary | ICD-10-CM | POA: Diagnosis not present

## 2020-08-11 DIAGNOSIS — N2581 Secondary hyperparathyroidism of renal origin: Secondary | ICD-10-CM | POA: Diagnosis not present

## 2020-08-13 DIAGNOSIS — Z23 Encounter for immunization: Secondary | ICD-10-CM | POA: Diagnosis not present

## 2020-08-13 DIAGNOSIS — D631 Anemia in chronic kidney disease: Secondary | ICD-10-CM | POA: Diagnosis not present

## 2020-08-13 DIAGNOSIS — D689 Coagulation defect, unspecified: Secondary | ICD-10-CM | POA: Diagnosis not present

## 2020-08-13 DIAGNOSIS — N2581 Secondary hyperparathyroidism of renal origin: Secondary | ICD-10-CM | POA: Diagnosis not present

## 2020-08-13 DIAGNOSIS — T8249XA Other complication of vascular dialysis catheter, initial encounter: Secondary | ICD-10-CM | POA: Diagnosis not present

## 2020-08-13 DIAGNOSIS — D688 Other specified coagulation defects: Secondary | ICD-10-CM | POA: Diagnosis not present

## 2020-08-13 DIAGNOSIS — D509 Iron deficiency anemia, unspecified: Secondary | ICD-10-CM | POA: Diagnosis not present

## 2020-08-13 DIAGNOSIS — N186 End stage renal disease: Secondary | ICD-10-CM | POA: Diagnosis not present

## 2020-08-13 DIAGNOSIS — Z992 Dependence on renal dialysis: Secondary | ICD-10-CM | POA: Diagnosis not present

## 2020-08-15 ENCOUNTER — Other Ambulatory Visit (HOSPITAL_COMMUNITY): Payer: Self-pay

## 2020-08-16 DIAGNOSIS — D688 Other specified coagulation defects: Secondary | ICD-10-CM | POA: Diagnosis not present

## 2020-08-16 DIAGNOSIS — D509 Iron deficiency anemia, unspecified: Secondary | ICD-10-CM | POA: Diagnosis not present

## 2020-08-16 DIAGNOSIS — D631 Anemia in chronic kidney disease: Secondary | ICD-10-CM | POA: Diagnosis not present

## 2020-08-16 DIAGNOSIS — Z992 Dependence on renal dialysis: Secondary | ICD-10-CM | POA: Diagnosis not present

## 2020-08-16 DIAGNOSIS — Z89511 Acquired absence of right leg below knee: Secondary | ICD-10-CM | POA: Diagnosis not present

## 2020-08-16 DIAGNOSIS — M86071 Acute hematogenous osteomyelitis, right ankle and foot: Secondary | ICD-10-CM | POA: Diagnosis not present

## 2020-08-16 DIAGNOSIS — L97509 Non-pressure chronic ulcer of other part of unspecified foot with unspecified severity: Secondary | ICD-10-CM | POA: Diagnosis not present

## 2020-08-16 DIAGNOSIS — T8249XA Other complication of vascular dialysis catheter, initial encounter: Secondary | ICD-10-CM | POA: Diagnosis not present

## 2020-08-16 DIAGNOSIS — I96 Gangrene, not elsewhere classified: Secondary | ICD-10-CM | POA: Diagnosis not present

## 2020-08-16 DIAGNOSIS — N186 End stage renal disease: Secondary | ICD-10-CM | POA: Diagnosis not present

## 2020-08-16 DIAGNOSIS — D689 Coagulation defect, unspecified: Secondary | ICD-10-CM | POA: Diagnosis not present

## 2020-08-16 DIAGNOSIS — Z23 Encounter for immunization: Secondary | ICD-10-CM | POA: Diagnosis not present

## 2020-08-16 DIAGNOSIS — N2581 Secondary hyperparathyroidism of renal origin: Secondary | ICD-10-CM | POA: Diagnosis not present

## 2020-08-17 ENCOUNTER — Other Ambulatory Visit (HOSPITAL_COMMUNITY)
Admission: RE | Admit: 2020-08-17 | Discharge: 2020-08-17 | Disposition: A | Discharge status: Home / Self Care | Payer: Medicare Other | Source: Ambulatory Visit | Attending: Cardiology | Admitting: Cardiology

## 2020-08-17 DIAGNOSIS — Z20822 Contact with and (suspected) exposure to covid-19: Secondary | ICD-10-CM | POA: Insufficient documentation

## 2020-08-17 DIAGNOSIS — Z01812 Encounter for preprocedural laboratory examination: Secondary | ICD-10-CM | POA: Diagnosis not present

## 2020-08-17 LAB — SARS CORONAVIRUS 2 (TAT 6-24 HRS): SARS Coronavirus 2: NEGATIVE

## 2020-08-18 DIAGNOSIS — T8249XA Other complication of vascular dialysis catheter, initial encounter: Secondary | ICD-10-CM | POA: Diagnosis not present

## 2020-08-18 DIAGNOSIS — D631 Anemia in chronic kidney disease: Secondary | ICD-10-CM | POA: Diagnosis not present

## 2020-08-18 DIAGNOSIS — Z992 Dependence on renal dialysis: Secondary | ICD-10-CM | POA: Diagnosis not present

## 2020-08-18 DIAGNOSIS — D689 Coagulation defect, unspecified: Secondary | ICD-10-CM | POA: Diagnosis not present

## 2020-08-18 DIAGNOSIS — Z23 Encounter for immunization: Secondary | ICD-10-CM | POA: Diagnosis not present

## 2020-08-18 DIAGNOSIS — D688 Other specified coagulation defects: Secondary | ICD-10-CM | POA: Diagnosis not present

## 2020-08-18 DIAGNOSIS — N2581 Secondary hyperparathyroidism of renal origin: Secondary | ICD-10-CM | POA: Diagnosis not present

## 2020-08-18 DIAGNOSIS — D509 Iron deficiency anemia, unspecified: Secondary | ICD-10-CM | POA: Diagnosis not present

## 2020-08-18 DIAGNOSIS — N186 End stage renal disease: Secondary | ICD-10-CM | POA: Diagnosis not present

## 2020-08-19 ENCOUNTER — Ambulatory Visit (HOSPITAL_COMMUNITY)
Admission: RE | Admit: 2020-08-19 | Discharge: 2020-08-19 | Disposition: A | Discharge status: Home / Self Care | Payer: Medicare Other | Attending: Cardiology | Admitting: Cardiology

## 2020-08-19 ENCOUNTER — Encounter (HOSPITAL_COMMUNITY)
Admission: RE | Disposition: A | Discharge status: Home / Self Care | Payer: Self-pay | Source: Home / Self Care | Attending: Cardiology

## 2020-08-19 ENCOUNTER — Other Ambulatory Visit: Payer: Self-pay

## 2020-08-19 DIAGNOSIS — I2510 Atherosclerotic heart disease of native coronary artery without angina pectoris: Secondary | ICD-10-CM | POA: Diagnosis present

## 2020-08-19 DIAGNOSIS — I495 Sick sinus syndrome: Secondary | ICD-10-CM | POA: Insufficient documentation

## 2020-08-19 DIAGNOSIS — Z87891 Personal history of nicotine dependence: Secondary | ICD-10-CM | POA: Insufficient documentation

## 2020-08-19 DIAGNOSIS — Z79899 Other long term (current) drug therapy: Secondary | ICD-10-CM | POA: Diagnosis not present

## 2020-08-19 DIAGNOSIS — Z89511 Acquired absence of right leg below knee: Secondary | ICD-10-CM | POA: Diagnosis not present

## 2020-08-19 DIAGNOSIS — Z955 Presence of coronary angioplasty implant and graft: Secondary | ICD-10-CM | POA: Diagnosis not present

## 2020-08-19 DIAGNOSIS — I472 Ventricular tachycardia: Secondary | ICD-10-CM | POA: Insufficient documentation

## 2020-08-19 DIAGNOSIS — I25118 Atherosclerotic heart disease of native coronary artery with other forms of angina pectoris: Secondary | ICD-10-CM | POA: Insufficient documentation

## 2020-08-19 DIAGNOSIS — Z794 Long term (current) use of insulin: Secondary | ICD-10-CM | POA: Insufficient documentation

## 2020-08-19 DIAGNOSIS — Z888 Allergy status to other drugs, medicaments and biological substances status: Secondary | ICD-10-CM | POA: Diagnosis not present

## 2020-08-19 DIAGNOSIS — E785 Hyperlipidemia, unspecified: Secondary | ICD-10-CM | POA: Insufficient documentation

## 2020-08-19 DIAGNOSIS — Z881 Allergy status to other antibiotic agents status: Secondary | ICD-10-CM | POA: Insufficient documentation

## 2020-08-19 DIAGNOSIS — Z7901 Long term (current) use of anticoagulants: Secondary | ICD-10-CM | POA: Diagnosis not present

## 2020-08-19 DIAGNOSIS — I739 Peripheral vascular disease, unspecified: Secondary | ICD-10-CM | POA: Insufficient documentation

## 2020-08-19 DIAGNOSIS — M1990 Unspecified osteoarthritis, unspecified site: Secondary | ICD-10-CM | POA: Insufficient documentation

## 2020-08-19 DIAGNOSIS — H269 Unspecified cataract: Secondary | ICD-10-CM | POA: Diagnosis not present

## 2020-08-19 DIAGNOSIS — I4891 Unspecified atrial fibrillation: Secondary | ICD-10-CM | POA: Insufficient documentation

## 2020-08-19 DIAGNOSIS — I129 Hypertensive chronic kidney disease with stage 1 through stage 4 chronic kidney disease, or unspecified chronic kidney disease: Secondary | ICD-10-CM | POA: Insufficient documentation

## 2020-08-19 DIAGNOSIS — Z89422 Acquired absence of other left toe(s): Secondary | ICD-10-CM | POA: Diagnosis not present

## 2020-08-19 DIAGNOSIS — N189 Chronic kidney disease, unspecified: Secondary | ICD-10-CM | POA: Insufficient documentation

## 2020-08-19 DIAGNOSIS — E1122 Type 2 diabetes mellitus with diabetic chronic kidney disease: Secondary | ICD-10-CM | POA: Diagnosis not present

## 2020-08-19 DIAGNOSIS — Z8249 Family history of ischemic heart disease and other diseases of the circulatory system: Secondary | ICD-10-CM | POA: Diagnosis not present

## 2020-08-19 DIAGNOSIS — Z833 Family history of diabetes mellitus: Secondary | ICD-10-CM | POA: Diagnosis not present

## 2020-08-19 DIAGNOSIS — I4729 Other ventricular tachycardia: Secondary | ICD-10-CM

## 2020-08-19 DIAGNOSIS — R9439 Abnormal result of other cardiovascular function study: Secondary | ICD-10-CM | POA: Insufficient documentation

## 2020-08-19 HISTORY — PX: LEFT HEART CATH AND CORONARY ANGIOGRAPHY: CATH118249

## 2020-08-19 LAB — COMPREHENSIVE METABOLIC PANEL
ALT: 21 U/L (ref 0–44)
AST: 22 U/L (ref 15–41)
Albumin: 3.3 g/dL — ABNORMAL LOW (ref 3.5–5.0)
Alkaline Phosphatase: 83 U/L (ref 38–126)
Anion gap: 9 (ref 5–15)
BUN: 16 mg/dL (ref 8–23)
CO2: 30 mmol/L (ref 22–32)
Calcium: 8.9 mg/dL (ref 8.9–10.3)
Chloride: 100 mmol/L (ref 98–111)
Creatinine, Ser: 2.89 mg/dL — ABNORMAL HIGH (ref 0.61–1.24)
GFR, Estimated: 22 mL/min — ABNORMAL LOW (ref >60)
Glucose, Bld: 142 mg/dL — ABNORMAL HIGH (ref 70–99)
Potassium: 4.1 mmol/L (ref 3.5–5.1)
Sodium: 139 mmol/L (ref 135–145)
Total Bilirubin: 1.2 mg/dL (ref 0.3–1.2)
Total Protein: 5.6 g/dL — ABNORMAL LOW (ref 6.5–8.1)

## 2020-08-19 LAB — CBC
HCT: 31.8 % — ABNORMAL LOW (ref 39.0–52.0)
Hemoglobin: 10.4 g/dL — ABNORMAL LOW (ref 13.0–17.0)
MCH: 32 pg (ref 26.0–34.0)
MCHC: 32.7 g/dL (ref 30.0–36.0)
MCV: 97.8 fL (ref 80.0–100.0)
Platelets: 129 10*3/uL — ABNORMAL LOW (ref 150–400)
RBC: 3.25 MIL/uL — ABNORMAL LOW (ref 4.22–5.81)
RDW: 17.3 % — ABNORMAL HIGH (ref 11.5–15.5)
WBC: 6.4 10*3/uL (ref 4.0–10.5)
nRBC: 0 % (ref 0.0–0.2)

## 2020-08-19 LAB — GLUCOSE, CAPILLARY: Glucose-Capillary: 128 mg/dL — ABNORMAL HIGH (ref 70–99)

## 2020-08-19 SURGERY — LEFT HEART CATH AND CORONARY ANGIOGRAPHY
Anesthesia: LOCAL

## 2020-08-19 MED ORDER — FENTANYL CITRATE (PF) 100 MCG/2ML IJ SOLN
INTRAMUSCULAR | Status: DC | PRN
Start: 2020-08-19 — End: 2020-08-19
  Administered 2020-08-19: 25 ug via INTRAVENOUS
  Administered 2020-08-19: 50 ug via INTRAVENOUS

## 2020-08-19 MED ORDER — SODIUM CHLORIDE 0.9 % IV SOLN: 250.0000 mL | INTRAVENOUS | Status: DC | PRN

## 2020-08-19 MED ORDER — SODIUM CHLORIDE 0.9% FLUSH: 3.0000 mL | Freq: Two times a day (BID) | INTRAVENOUS | Status: DC

## 2020-08-19 MED ORDER — SODIUM CHLORIDE 0.9% FLUSH: 3.0000 mL | INTRAVENOUS | Status: DC | PRN

## 2020-08-19 MED ORDER — ASPIRIN 81 MG PO CHEW
81.0000 mg | CHEWABLE_TABLET | ORAL | Status: AC
  Administered 2020-08-19: 81 mg via ORAL
  Filled 2020-08-19: qty 1

## 2020-08-19 MED ORDER — ONDANSETRON HCL 4 MG/2ML IJ SOLN: 4.0000 mg | Freq: Four times a day (QID) | INTRAMUSCULAR | Status: DC | PRN

## 2020-08-19 MED ORDER — MIDAZOLAM HCL 2 MG/2ML IJ SOLN
INTRAMUSCULAR | Status: AC
  Filled 2020-08-19: qty 2

## 2020-08-19 MED ORDER — MIDAZOLAM HCL 2 MG/2ML IJ SOLN
INTRAMUSCULAR | Status: DC | PRN
  Administered 2020-08-19: 1 mg via INTRAVENOUS
  Administered 2020-08-19: 2 mg via INTRAVENOUS

## 2020-08-19 MED ORDER — HEPARIN (PORCINE) IN NACL 1000-0.9 UT/500ML-% IV SOLN
INTRAVENOUS | Status: AC
  Filled 2020-08-19: qty 1000

## 2020-08-19 MED ORDER — HEPARIN SODIUM (PORCINE) 1000 UNIT/ML IJ SOLN
INTRAMUSCULAR | Status: AC
  Filled 2020-08-19: qty 1

## 2020-08-19 MED ORDER — HYDRALAZINE HCL 20 MG/ML IJ SOLN: 10.0000 mg | INTRAMUSCULAR | Status: DC | PRN

## 2020-08-19 MED ORDER — LIDOCAINE HCL (PF) 1 % IJ SOLN
INTRAMUSCULAR | Status: DC | PRN
  Administered 2020-08-19: 15 mL

## 2020-08-19 MED ORDER — LIDOCAINE HCL (PF) 1 % IJ SOLN
INTRAMUSCULAR | Status: AC
  Filled 2020-08-19: qty 30

## 2020-08-19 MED ORDER — SODIUM CHLORIDE 0.9 % IV SOLN: INTRAVENOUS | Status: DC

## 2020-08-19 MED ORDER — FENTANYL CITRATE (PF) 100 MCG/2ML IJ SOLN
INTRAMUSCULAR | Status: AC
  Filled 2020-08-19: qty 2

## 2020-08-19 MED ORDER — HEPARIN (PORCINE) IN NACL 1000-0.9 UT/500ML-% IV SOLN
INTRAVENOUS | Status: DC | PRN
  Administered 2020-08-19: 500 mL

## 2020-08-19 MED ORDER — ACETAMINOPHEN 325 MG PO TABS: 650.0000 mg | ORAL_TABLET | ORAL | Status: DC | PRN

## 2020-08-19 MED ORDER — IOHEXOL 350 MG/ML SOLN
INTRAVENOUS | Status: DC | PRN
  Administered 2020-08-19: 10 mL

## 2020-08-19 SURGICAL SUPPLY — 10 items
CATH INFINITI 5FR MULTPACK ANG (CATHETERS) ×2 IMPLANT
CLOSURE MYNX CONTROL 5F (Vascular Products) ×2 IMPLANT
KIT HEART LEFT (KITS) ×2 IMPLANT
KIT MICROPUNCTURE NIT STIFF (SHEATH) ×2 IMPLANT
PACK CARDIAC CATHETERIZATION (CUSTOM PROCEDURE TRAY) ×2 IMPLANT
SHEATH PINNACLE 5F 10CM (SHEATH) ×2 IMPLANT
SHEATH PROBE COVER 6X72 (BAG) ×2 IMPLANT
TRANSDUCER W/STOPCOCK (MISCELLANEOUS) ×2 IMPLANT
TUBING CIL FLEX 10 FLL-RA (TUBING) ×2 IMPLANT
WIRE EMERALD 3MM-J .035X150CM (WIRE) ×2 IMPLANT

## 2020-08-19 NOTE — CV Procedure (Signed)
History:  11/09/2014: Diamondback CSI coronary atherectomy and stenting of the mid LAD with implantation of a 3.0 x 28 mm Promus Premier DES following. Stenting of the distal dominant circumflex coronary artery following diamondback atherectomy and implantation of a 2.5 x 16 mm Promus Premier DES for high-grade 90% stenosis.        11/09/2014: Diamondback CSI coronary atherectomy and stenting of the mid LAD with implantation of a 3.0 x 28 mm Promus Premier DES following. Stenting of the distal dominant circumflex coronary artery following diamondback atherectomy and implantation of a 2.5 x 16 mm Promus Premier DES for high-grade 90% stenosis.  Left Heart Catheterization 08/19/20: Patent stents and mild diffuse disease in small vessels.   Adrian Prows, MD, [facility] 08/19/2020, 12:44 PM Office: [phone]

## 2020-08-19 NOTE — Interval H&P Note (Signed)
History and Physical Interval Note:  08/19/2020 11:51 AM  Alexander [name]  has presented today for surgery, with the diagnosis of abn stress   cad     htn.  The various methods of treatment have been discussed with the patient and family. After consideration of risks, benefits and other options for treatment, the patient has consented to  Procedure(s): LEFT HEART CATH AND CORONARY ANGIOGRAPHY (N/A) and possible angioplasty as a surgical intervention.  The patient's history has been reviewed, patient examined, no change in status, stable for surgery.  I have reviewed the patient's chart and labs.  Questions were answered to the patient's satisfaction.   Cath Lab Visit (complete for each Cath Lab visit)  Clinical Evaluation Leading to the Procedure:   ACS: No.  Non-ACS:    Anginal Classification: CCS III  Anti-ischemic medical therapy: Maximal Therapy (2 or more classes of medications)  Non-Invasive Test Results: No non-invasive testing performed  Prior CABG: No previous CABG  Adrian Prows

## 2020-08-19 NOTE — Discharge Instructions (Signed)
RESTART ELIQUIS TOMORROW  Angiogram, Care After This sheet gives you information about how to care for yourself after your procedure. Your health care provider may also give you more specific instructions. If you have problems or questions, contact your health care provider. What can I expect after the procedure? After the procedure, it is common to have bruising and tenderness at the catheter insertion area. Follow these instructions at home: Insertion site care  Follow instructions from your health care provider about how to take care of your insertion site. Make sure you: ? Wash your hands with soap and water before you change your bandage (dressing). If soap and water are not available, use hand sanitizer. ? Change your dressing as told by your health care provider. ? Leave stitches (sutures), skin glue, or adhesive strips in place. These skin closures may need to stay in place for 2 weeks or longer. If adhesive strip edges start to loosen and curl up, you may trim the loose edges. Do not remove adhesive strips completely unless your health care provider tells you to do that.  Do not take baths, swim, or use a hot tub until your health care provider approves.  You may shower 24-48 hours after the procedure or as told by your health care provider. ? Gently wash the site with plain soap and water. ? Pat the area dry with a clean towel. ? Do not rub the site. This may cause bleeding.  Do not apply powder or lotion to the site. Keep the site clean and dry.  Check your insertion site every day for signs of infection. Check for: ? Redness, swelling, or pain. ? Fluid or blood. ? Warmth. ? Pus or a bad smell. Activity  Rest as told by your health care provider, usually for 1-2 days.  Do not lift anything that is heavier than 10 lbs. (4.5 kg) or as told by your health care provider.  Do not drive for 24 hours if you were given a medicine to help you relax (sedative).  Do not drive or  use heavy machinery while taking prescription pain medicine. General instructions   Return to your normal activities as told by your health care provider, usually in about a week. Ask your health care provider what activities are safe for you.  If the catheter site starts bleeding, lie flat and put pressure on the site. If the bleeding does not stop, get help right away. This is a medical emergency.  Drink enough fluid to keep your urine clear or pale yellow. This helps flush the contrast dye from your body.  Take over-the-counter and prescription medicines only as told by your health care provider.  Keep all follow-up visits as told by your health care provider. This is important. Contact a health care provider if:  You have a fever or chills.  You have redness, swelling, or pain around your insertion site.  You have fluid or blood coming from your insertion site.  The insertion site feels warm to the touch.  You have pus or a bad smell coming from your insertion site.  You have bruising around the insertion site.  You notice blood collecting in the tissue around the catheter site (hematoma). The hematoma may be painful to the touch. Get help right away if:  You have severe pain at the catheter insertion area.  The catheter insertion area swells very fast.  The catheter insertion area is bleeding, and the bleeding does not stop when you hold steady  pressure on the area.  The area near or just beyond the catheter insertion site becomes pale, cool, tingly, or numb. These symptoms may represent a serious problem that is an emergency. Do not wait to see if the symptoms will go away. Get medical help right away. Call your local emergency services (911 in the [place] Do not drive yourself to the hospital. Summary  After the procedure, it is common to have bruising and tenderness at the catheter insertion area.  After the procedure, it is important to rest and drink plenty of  fluids.  Do not take baths, swim, or use a hot tub until your health care provider says it is okay to do so. You may shower 24-48 hours after the procedure or as told by your health care provider.  If the catheter site starts bleeding, lie flat and put pressure on the site. If the bleeding does not stop, get help right away. This is a medical emergency. This information is not intended to replace advice given to you by your health care provider. Make sure you discuss any questions you have with your health care provider. Document Revised: 10/11/2017 Document Reviewed: 10/03/2016 Elsevier Patient Education  2020 [employer].

## 2020-08-20 DIAGNOSIS — N2581 Secondary hyperparathyroidism of renal origin: Secondary | ICD-10-CM | POA: Diagnosis not present

## 2020-08-20 DIAGNOSIS — Z992 Dependence on renal dialysis: Secondary | ICD-10-CM | POA: Diagnosis not present

## 2020-08-20 DIAGNOSIS — D631 Anemia in chronic kidney disease: Secondary | ICD-10-CM | POA: Diagnosis not present

## 2020-08-20 DIAGNOSIS — D689 Coagulation defect, unspecified: Secondary | ICD-10-CM | POA: Diagnosis not present

## 2020-08-20 DIAGNOSIS — D688 Other specified coagulation defects: Secondary | ICD-10-CM | POA: Diagnosis not present

## 2020-08-20 DIAGNOSIS — N186 End stage renal disease: Secondary | ICD-10-CM | POA: Diagnosis not present

## 2020-08-20 DIAGNOSIS — D509 Iron deficiency anemia, unspecified: Secondary | ICD-10-CM | POA: Diagnosis not present

## 2020-08-20 DIAGNOSIS — Z23 Encounter for immunization: Secondary | ICD-10-CM | POA: Diagnosis not present

## 2020-08-20 DIAGNOSIS — T8249XA Other complication of vascular dialysis catheter, initial encounter: Secondary | ICD-10-CM | POA: Diagnosis not present

## 2020-08-22 ENCOUNTER — Encounter (HOSPITAL_COMMUNITY): Payer: Self-pay | Admitting: Cardiology

## 2020-08-22 DIAGNOSIS — I509 Heart failure, unspecified: Secondary | ICD-10-CM | POA: Diagnosis not present

## 2020-08-22 DIAGNOSIS — N186 End stage renal disease: Secondary | ICD-10-CM | POA: Diagnosis not present

## 2020-08-22 DIAGNOSIS — Z992 Dependence on renal dialysis: Secondary | ICD-10-CM | POA: Diagnosis not present

## 2020-08-22 DIAGNOSIS — D61818 Other pancytopenia: Secondary | ICD-10-CM | POA: Diagnosis not present

## 2020-08-23 DIAGNOSIS — D689 Coagulation defect, unspecified: Secondary | ICD-10-CM | POA: Diagnosis not present

## 2020-08-23 DIAGNOSIS — T8249XA Other complication of vascular dialysis catheter, initial encounter: Secondary | ICD-10-CM | POA: Diagnosis not present

## 2020-08-23 DIAGNOSIS — D688 Other specified coagulation defects: Secondary | ICD-10-CM | POA: Diagnosis not present

## 2020-08-23 DIAGNOSIS — D509 Iron deficiency anemia, unspecified: Secondary | ICD-10-CM | POA: Diagnosis not present

## 2020-08-23 DIAGNOSIS — N2581 Secondary hyperparathyroidism of renal origin: Secondary | ICD-10-CM | POA: Diagnosis not present

## 2020-08-23 DIAGNOSIS — N186 End stage renal disease: Secondary | ICD-10-CM | POA: Diagnosis not present

## 2020-08-23 DIAGNOSIS — Z992 Dependence on renal dialysis: Secondary | ICD-10-CM | POA: Diagnosis not present

## 2020-08-23 DIAGNOSIS — D631 Anemia in chronic kidney disease: Secondary | ICD-10-CM | POA: Diagnosis not present

## 2020-08-23 DIAGNOSIS — Z23 Encounter for immunization: Secondary | ICD-10-CM | POA: Diagnosis not present

## 2020-08-23 NOTE — Progress Notes (Signed)
Alexander [name] Date of Birth: 07/23/1961 MRN: [id number] Primary Care Provider:Tisovec, [name], MD Former Cardiology Providers: Dr. Adrian Prows, Jeri Lager, APRN, FNP-C, Dr. Terri Skains Primary Cardiologist: Alethia Berthold, PA-C, (established care 11/25/2019) Electrophysiologist: Dr. Cristopher Peru  Date: 08/24/2020 Last Office Visit: 07/27/2020  Chief Complaint  Patient presents with  . Coronary Artery Disease  . Follow-up    HPI  Alexander [name] is a 59 y.o.  male who presents to the office with a chief complaint of " abnormal stress test." Patient has a complex past medical history and multiple cardiovascular risk factors which include but not limited to: Paroxysmal atrial fibrillation, on oral anticoagulation, coronary artery disease ( angioplasty and stenting to the LAD and dominant circumflex in December 2015, diffusely diseased small RCA), peripheral artery disease with prior bypass, status post right BKA, pacemaker implantation secondary to complete heart block (implanted 10/07/2014), former smoker, insulin-dependent diabetes mellitus, alcohol use, hemodialysis, obesity due to excess calories.  Patient presents to the office accompanied by his daughter Alexander [name].  Nonsustained ventricular tachycardia was noted on pacemaker which triggered him to undergo stress test and echocardiogram.  Stress test was abnormal, noting reversible ischemia and LVEF per gated SPECT was reduced.  Patient underwent left heart catheterization.  Of note patient states that he was recently hospitalized for low hemoglobin.  He has undergone extensive GI evaluation including EGD, colonoscopy, and capsule study without explanation of etiology for anemia.  Patient presents for follow-up of left heart catheterization on 08/19/2020.  Left heart catheterization and coronary angiography revealed previously placed stents to LCx and LAD, mild to moderate residual diffuse disease in LAD and LCx, severe RCA disease.   Recommendation from cath was continued medical management.  Patient is presently doing well without complaints of angina, shortness of breath.  Denies swelling, palpitations, orthopnea, PND.  ALLERGIES: Allergies  Allergen Reactions  . Cytoxan [Cyclophosphamide]     pancytopenia  . Keflex [Cephalexin] Nausea And Vomiting  . Pletal [Cilostazol] Palpitations    "Can hear heart beating loudly".   MEDICATION LIST PRIOR TO VISIT: Current Outpatient Medications on File Prior to Visit  Medication Sig Dispense Refill  . ACCU-CHEK FASTCLIX LANCETS MISC Check blood sugar TID & QHS 102 each 2  . blood glucose meter kit and supplies KIT Check blood sugar TID & QHS 1 each 0  . Blood Glucose Monitoring Suppl (ACCU-CHEK ADVANTAGE DIABETES) kit Use as instructed 1 each 0  . glucose blood (ACCU-CHEK AVIVA PLUS) test strip Use as instructed 100 each 12  . glucose blood (CHOICE DM FORA G20 TEST STRIPS) test strip Check blood sugar TID & QHS 100 each 12  . glucose blood test strip Use as instructed 100 each 12  . insulin NPH-regular Human (70-30) 100 UNIT/ML injection Inject 25 Units into the skin 2 (two) times daily with a meal. 10 mL 11  . Multiple Vitamin (MULTIVITAMIN ADULT) TABS Take 1 tablet by mouth daily.    . prazosin (MINIPRESS) 2 MG capsule Take 1 capsule (2 mg total) by mouth daily after supper. 90 capsule 1   No current facility-administered medications on file prior to visit.    PAST MEDICAL HISTORY: Past Medical History:  Diagnosis Date  . Alcohol abuse   . Anemia   . Arthritis    "patient does not think so."  . Atrial fibrillation ([place])   . Cardiac syncope 10/07/14   rapid A fib with 8 sec pauses on converison with syncope- temp pacing wire placed then  PPM  . Cataract    BILATERAL  . Chronic kidney disease   . Coronary artery disease   . Diabetes mellitus    dx---been  awhile  . Encounter for care of pacemaker 10/08/2014  . History of blood transfusion   . Hyperlipemia   .  Hypertension   . Osteomyelitis ([place])    right foot  . Peripheral vascular disease ([place])   . Presence of permanent cardiac pacemaker 10/08/2014  . Sinus node dysfunction ([place]) 10/08/2014    PAST SURGICAL HISTORY: Past Surgical History:  Procedure Laterality Date  . ABDOMINAL AORTOGRAM W/LOWER EXTREMITY N/A 09/09/2017   Procedure: ABDOMINAL AORTOGRAM W/LOWER EXTREMITY;  Surgeon: Angelia Mould, MD;  Location: [place] [facility];  Service: Cardiovascular;  Laterality: N/A;  . ABDOMINAL AORTOGRAM W/LOWER EXTREMITY Right 11/11/2019   Procedure: ABDOMINAL AORTOGRAM W/LOWER EXTREMITY;  Surgeon: Marty Heck, MD;  Location: [place] [facility];  Service: Cardiovascular;  Laterality: Right;  . AMPUTATION Left 08/19/2013   Procedure: AMPUTATION RAY;  Surgeon: Alta Corning, MD;  Location: [place];  Service: Orthopedics;  Laterality: Left;  ray amputation left 5th  . AMPUTATION Left 10/27/2013   Procedure: AMPUTATION DIGIT-LEFT 4TH TOE, 4th and 5th metatarsal.;  Surgeon: Angelia Mould, MD;  Location: [place];  Service: Vascular;  Laterality: Left;  . AMPUTATION Left 11/04/2013   Procedure: LEFT FOOT TRANS-METATARSAL AMPUTATION WITH WOUND CLOSURE ;  Surgeon: Alta Corning, MD;  Location: [place];  Service: Orthopedics;  Laterality: Left;  . AMPUTATION Right 09/10/2017   Procedure: RIGHT FOURTH AND FIFTH TOE AMPUTATION;  Surgeon: Angelia Mould, MD;  Location: [place];  Service: Vascular;  Laterality: Right;  . AMPUTATION Right 02/19/2020   Procedure: AMPUTATION RIGHT 3RD TOE;  Surgeon: Angelia Mould, MD;  Location: [place];  Service: Vascular;  Laterality: Right;  . AMPUTATION Right 03/11/2020   Procedure: RIGHT BELOW KNEE AMPUTATION;  Surgeon: Newt Minion, MD;  Location: [place];  Service: Orthopedics;  Laterality: Right;  . APPLICATION OF WOUND VAC Right 02/19/2020   Procedure: Application Of Wound Vac Right foot;  Surgeon: Angelia Mould, MD;  Location: [place];   Service: Vascular;  Laterality: Right;  . BIOPSY  07/20/2020   Procedure: BIOPSY;  Surgeon: Rush Landmark Telford Nab., MD;  Location: [place];  Service: Gastroenterology;;  . CARDIAC CATHETERIZATION  10/07/2014   Procedure: LEFT HEART CATH AND CORONARY ANGIOGRAPHY;  Surgeon: Laverda Page, MD;  Location[facility];  Service: Cardiovascular;;  . COLONOSCOPY W/ POLYPECTOMY    . COLONOSCOPY WITH PROPOFOL N/A 05/25/2020   Procedure: COLONOSCOPY WITH PROPOFOL;  Surgeon: Gatha Mayer, MD;  Location: [facility];  Service: Endoscopy;  Laterality: N/A;  . EMBOLECTOMY Right 09/12/2017   Procedure: Thrombectomy  & Redo Right Below Knee Popliteal Artery Bypass Graft  ;  Surgeon: Waynetta Sandy, MD;  Location: [place];  Service: Vascular;  Laterality: Right;  . ENTEROSCOPY N/A 07/20/2020   Procedure: ENTEROSCOPY;  Surgeon: Mansouraty, Telford Nab., MD;  Location: [place];  Service: Gastroenterology;  Laterality: N/A;  . ESOPHAGOGASTRODUODENOSCOPY (EGD) WITH PROPOFOL N/A 05/24/2020   Procedure: ESOPHAGOGASTRODUODENOSCOPY (EGD) WITH PROPOFOL;  Surgeon: Ladene Artist, MD;  Location: [facility];  Service: Endoscopy;  Laterality: N/A;  . EYE SURGERY Bilateral    cataract  . FEMORAL-TIBIAL BYPASS GRAFT Left 10/27/2013   Procedure: BYPASS GRAFT LEFT FEMORAL- POSTERIOR TIBIAL ARTERY;  Surgeon: Angelia Mould, MD;  Location: [place];  Service: Vascular;  Laterality:  Left;  . FEMORAL-TIBIAL BYPASS GRAFT Right 09/10/2017   Procedure: RIGHT SUPERFICIAL  FEMORAL ARTERY-BELOW KNEE POPLITEAL ARTERY BYPASS GRAFT WITH VEIN;  Surgeon: Angelia Mould, MD;  Location: [place];  Service: Vascular;  Laterality: Right;  . GIVENS CAPSULE STUDY N/A 07/20/2020   Procedure: GIVENS CAPSULE STUDY;  Surgeon: Irving Copas., MD;  Location: [place];  Service: Gastroenterology;  Laterality: N/A;  capsule placed through scope into duodenum @ 0936  . I & D EXTREMITY Left 10/27/2013   Procedure:  IRRIGATION AND DEBRIDEMENT EXTREMITY- LEFT FOOT;  Surgeon: Angelia Mould, MD;  Location: [place];  Service: Vascular;  Laterality: Left;  . I & D EXTREMITY Right 01/26/2020   Procedure: IRRIGATION AND DEBRIDEMENT EXTREMITY RIGHT FOOT WOUND;  Surgeon: Angelia Mould, MD;  Location: [place];  Service: Vascular;  Laterality: Right;  . INTRAOPERATIVE ARTERIOGRAM Right 09/10/2017   Procedure: INTRA OPERATIVE ARTERIOGRAM;  Surgeon: Angelia Mould, MD;  Location: [place];  Service: Vascular;  Laterality: Right;  . IR ANGIOGRAM FOLLOW UP STUDY  09/09/2017  . IR FLUORO GUIDE CV LINE RIGHT  05/31/2020  . IR US GUIDE VASC ACCESS RIGHT  06/01/2020  . LEFT HEART CATH AND CORONARY ANGIOGRAPHY N/A 08/19/2020   Procedure: LEFT HEART CATH AND CORONARY ANGIOGRAPHY;  Surgeon: Adrian Prows, MD;  Location: [place] [facility];  Service: Cardiovascular;  Laterality: N/A;  . LEFT HEART CATHETERIZATION WITH CORONARY ANGIOGRAM N/A 11/09/2014   Procedure: LEFT HEART CATHETERIZATION WITH CORONARY ANGIOGRAM;  Surgeon: Laverda Page, MD;  Location: [facility] CATH LAB;  Service: Cardiovascular;  Laterality: N/A;  . LOWER EXTREMITY ANGIOGRAM Right 11/08/2015   Procedure: Lower Extremity Angiogram;  Surgeon: Serafina Mitchell, MD;  Location: [place] [facility];  Service: Cardiovascular;  Laterality: Right;  . LUMBAR LAMINECTOMY  06/13/2012   Procedure: MICRODISCECTOMY LUMBAR LAMINECTOMY;  Surgeon: Jessy Oto, MD;  Location: [place];  Service: Orthopedics;  Laterality: N/A;  Central laminectomy L2-3, L3-4, L4-5  . PACEMAKER INSERTION  10/08/14   MDT Advisa MRI compatible dual chamber pacemaker implanted by Dr Caryl Comes for syncope with post-termination pauses  . PERCUTANEOUS CORONARY STENT INTERVENTION (PCI-S)  11/09/2014   des to lad & distal circumflex         Dr  [name]  . PERIPHERAL VASCULAR BALLOON ANGIOPLASTY Right 11/11/2019   Procedure: PERIPHERAL VASCULAR BALLOON ANGIOPLASTY;  Surgeon: Marty Heck, MD;   Location: [place] [facility];  Service: Cardiovascular;  Laterality: Right;  below knee popliteal, tibioperoneal trunk, posterior tibial arteries  . PERIPHERAL VASCULAR CATHETERIZATION N/A 11/08/2015   Procedure: Abdominal Aortogram;  Surgeon: Serafina Mitchell, MD;  Location: [place] [facility];  Service: Cardiovascular;  Laterality: N/A;  . PERIPHERAL VASCULAR CATHETERIZATION Right 11/08/2015   Procedure: Peripheral Vascular Atherectomy;  Surgeon: Serafina Mitchell, MD;  Location: [place] [facility];  Service: Cardiovascular;  Laterality: Right;  . PERIPHERAL VASCULAR CATHETERIZATION N/A 10/10/2016   Procedure: Lower Extremity Angiography;  Surgeon: Waynetta Sandy, MD;  Location: [place] [facility];  Service: Cardiovascular;  Laterality: N/A;  . PERIPHERAL VASCULAR CATHETERIZATION Right 10/10/2016   Procedure: Peripheral Vascular Atherectomy;  Surgeon: Waynetta Sandy, MD;  Location: [place] [facility];  Service: Cardiovascular;  Laterality: Right;  Popliteal  . PERMANENT PACEMAKER INSERTION N/A 10/08/2014   Procedure: PERMANENT PACEMAKER INSERTION;  Surgeon: Deboraha Sprang, MD;  Location: [facility] CATH LAB;  Service: Cardiovascular;  Laterality: N/A;  . SUBMUCOSAL TATTOO INJECTION  07/20/2020   Procedure: SUBMUCOSAL  TATTOO INJECTION;  Surgeon: Irving Copas., MD;  Location: [place];  Service: Gastroenterology;;  . TEMPORARY PACEMAKER INSERTION Bilateral 10/07/2014   Procedure: TEMPORARY PACEMAKER INSERTION;  Surgeon: Laverda Page, MD;  Location: [facility] CATH LAB;  Service: Cardiovascular;  Laterality: Bilateral;  . WOUND DEBRIDEMENT Right 02/19/2020   Procedure: DEBRIDEMENT WOUND RIGHT FOOT;  Surgeon: Angelia Mould, MD;  Location[facility] OR;  Service: Vascular;  Laterality: Right;    FAMILY HISTORY: The patient's family history includes Breast cancer in his sister; Diabetes in his brother, mother, and sister; Diabetes type II in his brother, mother, sister, and sister;  Hypertension in his brother, mother, and sister; Kidney failure in his brother; Liver cancer in his father.   SOCIAL HISTORY:  The patient  reports that he quit smoking about 5 years ago. His smoking use included cigarettes. He has a 45.00 pack-year smoking history. He has never used smokeless tobacco. He reports current alcohol use of about 6.0 standard drinks of alcohol per week. He reports that he does not use drugs.  Review of Systems  Constitutional: Negative for chills and fever.  HENT: Negative for hoarse voice and nosebleeds.   Eyes: Negative for discharge, double vision and pain.  Cardiovascular: Negative for chest pain, claudication, dyspnea on exertion, leg swelling, near-syncope, orthopnea, palpitations, paroxysmal nocturnal dyspnea and syncope.  Respiratory: Negative for hemoptysis and shortness of breath.   Musculoskeletal: Negative for muscle cramps and myalgias.  Gastrointestinal: Negative for abdominal pain, constipation, diarrhea, hematemesis, hematochezia, melena, nausea and vomiting.  Neurological: Negative for dizziness and light-headedness.    PHYSICAL EXAM: Vitals with BMI 08/24/2020 08/19/2020 08/19/2020  Height '6\' 0"'  - -  Weight 270 lbs - -  BMI 00.17 - -  Systolic 494 496 759  Diastolic 68 66 73  Pulse 88 68 67   CONSTITUTIONAL: Appears older than stated age, well-developed and well-nourished. No acute distress.  SKIN: Skin is warm and dry. No rash noted. No cyanosis. No pallor. No jaundice HEAD: Normocephalic and atraumatic.  EYES: No scleral icterus MOUTH/THROAT: Moist oral membranes.  NECK: No JVD present. No thyromegaly noted. No carotid bruits  LYMPHATIC: No visible cervical adenopathy.  CHEST Normal respiratory effort. No intercostal retractions.  Pacemaker in the left infraclavicular region site is clean dry and intact. Dialysis catheter present anterior chest wall.  LUNGS: Clear to auscultation bilaterally.  No stridor. No wheezes. No rales.   CARDIOVASCULAR: Regular, positive S1-S2, no murmurs rubs or gallops appreciated ABDOMINAL: Soft, nontender, nondistended, positive bowel sounds in all 4 quadrants, no apparent ascites.  EXTREMITIES: Right BKA, +1 pitting edema in the left lower extremity, difficult to palpate dorsalis pedis posterior tibial pulses on the left side, prior surgical site well-healed. HEMATOLOGIC: No significant bruising NEUROLOGIC: Oriented to person, place, and time. Nonfocal. Normal muscle tone.  PSYCHIATRIC: Normal mood and affect. Normal behavior. Cooperative  CARDIAC DATABASE: Pacemaker in situ: S/P Medtronic advica DR (MRI compatible) dual-chamber pacemaker by Dr. Caryl Comes on 10/07/2014 Remote pacemaker check 04/21/20: One VHR episode, EKG shows 21 beat NSVT. 06/010/2021: 12 Sec NSVT. One AHR episode consistent with AF for 00:08:15:50. There was 0.3% cumulative atrial arrhythmia burden. Normal function.  RA paced 36 and RV paced <0.1%. Patient is on Eliquis. H/O prior NSVT. Will consider stress testing.  Unscheduled (Alert)  06/15/20: The patient experienced a recurrence of atrial fibrillation, persisting since 06/03/20 (04:54 am). There were several PAF episodes on 7/20, 7/22 and 06/03/20 prior to the persistent episode, lasting up to 4 hours and  12 minutes in duration. There were 0 high ventricular rate episodes detected. Health trends do not demonstrate significant abnormality. Battery longevity is 4 years. RA pacing is 44.5 %, RV pacing is 8.6 %. Patient on Eliquis.   EKG: 04/22/2020: Normal sinus rhythm, 72 bpm, left axis deviation, left anterior fascicular block, right bundle branch block, poor R wave progression.  No significant change compared to prior ECG dated 10/23/2019.  07/13/2020: Atrial fibrillation, 84 bpm, left axis deviation, right bundle branch block, left anterior fascicular block. Since last ECG sinus rhythm is transition to A. fib.  Echocardiogram: 03/15/2016: Left ventricle cavity is  normal in size. Severe concentric hypertrophy of the left ventricle. Normal global wall motion. Normal diastolic filling pattern. Calculated EF 66%. Left atrial cavity is mildly dilated. Right ventricle cavity is normal in size. Normal right ventricular function. Pacemaker lead/ICD lead noted in the RV. Trace mitral regurgitation. Trace pulmonic regurgitation.  05/24/2020: LVEF 60-65%, no regional wall motion of normalities, severe left ventricular hypertrophy, grade 2 diastolic impairment, mildly elevated PASP, moderately dilated biatrial cavities, mild MR.   Stress Testing:  Lexiscan myoview stress test 01/27/2016: 1. The resting electrocardiogram demonstrated normal sinus rhythm, normal resting conduction, no resting arrhythmias and normal rest repolarization. Stress EKG is non-diagnostic for ischemia as it a pharmacologic stress using Lexiscan. Stress symptoms included dyspnea. 2. The perfusion imaging study demonstrates a moderate-sized defect in the inferior wall extending from the base towards the apex, the defect slightly improved in stress images, suggests soft tissue attenuation. However scar in this region could not excluded. Left ventricular systolic function Calculated by QGS was 72%. This is a low risk study. Clinical correlation is recommended.  Heart Catheterization: Coronary angiogram 11/09/2014: CSI diamont back atherectomy to Mid LAD and Distal dominant Cx and stenting 3.0x32m and 2.5x165mPromus DES. RCA long stenosis and small vessel (angio 10/07/14) left alone (medical management only). LVEF 60%.  Left Heart Catheterization 08/19/20:  Prior coronary angiogram: 11/09/2014: Diamondback CSI coronary atherectomy and stenting of the mid LAD-3.0 x 28 mm Promus Premier DES following. Distal dominant circumflex coronary artery following diamondback atherectomy and implantation of a 2.5 x 16 mm Promus Premier DES. Previously placed stents in LAD and CX patent. Mild to moderate diffuse  disease in LAD, D1 and D2 and distal dominant Cx. RCA non dominant and severely diseased and small.   LABORATORY DATA: CBC Latest Ref Rng & Units 08/19/2020 07/23/2020 07/22/2020  WBC 4.0 - 10.5 K/uL 6.4 4.5 -  Hemoglobin 13.0 - 17.0 g/dL 10.4(L) 9.0(L) -  Hematocrit 39 - 52 % 31.8(L) 27.9(L) -  Platelets 150 - 400 K/uL 129(L) 135(L) 126(L)    CMP Latest Ref Rng & Units 08/19/2020 07/21/2020 07/20/2020  Glucose 70 - 99 mg/dL 142(H) 140(H) 155(H)  BUN 8 - 23 mg/dL '16 23 19  ' Creatinine 0.61 - 1.24 mg/dL 2.89(H) 5.06(H) 4.05(H)  Sodium 135 - 145 mmol/L 139 134(L) 138  Potassium 3.5 - 5.1 mmol/L 4.1 4.2 4.3  Chloride 98 - 111 mmol/L 100 99 103  CO2 22 - 32 mmol/L '30 27 24  ' Calcium 8.9 - 10.3 mg/dL 8.9 8.4(L) 8.3(L)  Total Protein 6.5 - 8.1 g/dL 5.6(L) - -  Total Bilirubin 0.3 - 1.2 mg/dL 1.2 - -  Alkaline Phos 38 - 126 U/L 83 - -  AST 15 - 41 U/L 22 - -  ALT 0 - 44 U/L 21 - -    Lipid Panel     Component Value Date/Time   CHOL 147  11/20/2013 1422   TRIG 160 (H) 11/20/2013 1422   HDL 30 (L) 11/20/2013 1422   CHOLHDL 4.9 11/20/2013 1422   VLDL 32 11/20/2013 1422   LDLCALC 85 11/20/2013 1422    Lab Results  Component Value Date   HGBA1C 5.5 05/23/2020   HGBA1C 8.6 (H) 01/27/2020   HGBA1C 6.5 (H) 09/10/2017   No components found for: NTPROBNP Lab Results  Component Value Date   TSH 1.480 10/08/2014   TSH 4.760 (H) 05/01/2014   TSH 2.048 10/21/2013    Cardiac Panel (last 3 results) No results for input(s): CKTOTAL, CKMB, TROPONINIHS, RELINDX in the last 72 hours.  External Labs:  Labs 10/06/2019: A1c 6.5%. Serum glucose 136 mg, BUN 55, creatinine 2.6, eGFR 25/30 ML. Potassium 5.4. CMP otherwise normal. HB 10.2/HCT 31.4, platelets 141. Normal indicis. Total cholesterol 95, triglycerides 98, HDL 34, LDL 41. Non-HDL cholesterol 61. PSA normal.  IMPRESSION:    ICD-10-CM   1. Atherosclerosis of native coronary artery of native heart without angina pectoris  I25.10  atorvastatin (LIPITOR) 20 MG tablet    metoprolol tartrate (LOPRESSOR) 100 MG tablet  2. Paroxysmal atrial fibrillation (HCC)  I48.0 amLODipine (NORVASC) 5 MG tablet    apixaban (ELIQUIS) 5 MG TABS tablet    metoprolol tartrate (LOPRESSOR) 100 MG tablet  3. Essential hypertension, benign  I10 hydrALAZINE (APRESOLINE) 100 MG tablet    amLODipine (NORVASC) 5 MG tablet    atorvastatin (LIPITOR) 20 MG tablet    metoprolol tartrate (LOPRESSOR) 100 MG tablet  4. Presence of permanent cardiac pacemaker  Z95.0   5. Long term (current) use of anticoagulants  Z79.01 apixaban (ELIQUIS) 5 MG TABS tablet    This patients CHA2DS2-VASc Score 3 (HTN, DM, vasc) and yearly risk of stroke 3.2%.   RECOMMENDATIONS: HARLESS MOLINARI is a 59 y.o. male whose past medical history and cardiovascular risk factors include: Paroxysmal atrial fibrillation, on oral anticoagulation, coronary artery disease with prior intervention, peripheral artery disease with prior bypass, status post right BKA, pacemaker implantation secondary to complete heart block (implanted 10/07/2014), former smoker, insulin-dependent diabetes mellitus, increased alcohol use, obesity due to excess calories.  1. Atherosclerosis of native coronary artery of native heart without angina pectoris Left heart catheterization and coronary angiography revealed previously placed stents to LCx and LAD, mild to moderate residual diffuse disease in LAD and LCx, severe RCA disease.  We will continue guideline directed medical therapy: Atorvastatin, metoprolol titrate.  Due to continued anemia and current high bleeding risk patient is not on aspirin due to increased bleeding risk when used with Eliquis.  We will continue to monitor hemoglobin/hematocrit. In view of CKD on dialysis he is also not on an ACE inhibitor or an ARB.  He is presently doing well without clinical signs of heart failure.  2. Paroxysmal atrial fibrillation (HCC) Currently well rate controlled, will  continue metoprolol tartrate 100 mg twice daily.  He has not had bleeding diathesis on Eliquis.  CHA2DS2-VASc score of 3 with 3.2% annual risk of stroke.  We will continue Eliquis 5 mg twice daily.  Notably patient's creatinine is 2.8 (>1.5), however he is only 59 years old and weighs >60 kg, therefore will not reduce Eliquis dose at this time. Will discuss with patient's nephrologist.  Eliquis 5 mg twice daily  3. Essential hypertension, benign Patient's blood pressure in office today was initially elevated, upon rechecking came down to 138/68.  This is still above goal however patient reports his blood pressures at the dialysis center  are well controlled following dialysis.  He has dialysis Tuesday, Thursday, Saturday.  Discussed with patient the importance of monitoring his blood pressure regularly, he verbalized understanding.  We will continue current antihypertensive medications including metoprolol, amlodipine, hydralazine.  4. Long term (current) use of anticoagulants Patient denies bleeding diathesis on Eliquis.  Discussed the risks and benefits of oral anticoagulation patient verbalized understanding.  Will continue Eliquis 5 mg twice daily.  Educated on importance of improving his modifiable cardiovascular risk factors which include but not limited to: Decreasing alcohol consumption to no more than 2 drinks per day, blood pressure management, lipid management, and glycemic control.  Patient verbalizes understanding.  FINAL MEDICATION LIST END OF ENCOUNTER:   Current Outpatient Medications:  .  ACCU-CHEK FASTCLIX LANCETS MISC, Check blood sugar TID & QHS, Disp: 102 each, Rfl: 2 .  amLODipine (NORVASC) 5 MG tablet, Take 1 tablet (5 mg total) by mouth daily., Disp: 90 tablet, Rfl: 3 .  apixaban (ELIQUIS) 5 MG TABS tablet, Take 1 tablet (5 mg total) by mouth 2 (two) times daily., Disp: 180 tablet, Rfl: 0 .  atorvastatin (LIPITOR) 20 MG tablet, Take 1 tablet (20 mg total) by mouth daily.,  Disp: 90 tablet, Rfl: 2 .  blood glucose meter kit and supplies KIT, Check blood sugar TID & QHS, Disp: 1 each, Rfl: 0 .  Blood Glucose Monitoring Suppl (ACCU-CHEK ADVANTAGE DIABETES) kit, Use as instructed, Disp: 1 each, Rfl: 0 .  glucose blood (ACCU-CHEK AVIVA PLUS) test strip, Use as instructed, Disp: 100 each, Rfl: 12 .  glucose blood (CHOICE DM FORA G20 TEST STRIPS) test strip, Check blood sugar TID & QHS, Disp: 100 each, Rfl: 12 .  glucose blood test strip, Use as instructed, Disp: 100 each, Rfl: 12 .  hydrALAZINE (APRESOLINE) 100 MG tablet, Take 1 tablet (100 mg total) by mouth 2 (two) times daily., Disp: 180 tablet, Rfl: 3 .  insulin NPH-regular Human (70-30) 100 UNIT/ML injection, Inject 25 Units into the skin 2 (two) times daily with a meal., Disp: 10 mL, Rfl: 11 .  metoprolol tartrate (LOPRESSOR) 100 MG tablet, Take 1 tablet (100 mg total) by mouth 2 (two) times daily., Disp: 180 tablet, Rfl: 3 .  Multiple Vitamin (MULTIVITAMIN ADULT) TABS, Take 1 tablet by mouth daily., Disp: , Rfl:  .  prazosin (MINIPRESS) 2 MG capsule, Take 1 capsule (2 mg total) by mouth daily after supper., Disp: 90 capsule, Rfl: 1  No orders of the defined types were placed in this encounter.  --Continue cardiac medications as reconciled in final medication list. --Return in about 3 months (around 11/24/2020) for CAD and A fib with EKG . Or sooner if needed. --Continue follow-up with your primary care physician regarding the management of your other chronic comorbid conditions.  Patient's questions and concerns were addressed to his satisfaction. He voices understanding of the instructions provided during this encounter.   This note was created using a voice recognition software as a result there may be grammatical errors inadvertently enclosed that do not reflect the nature of this encounter. Every attempt is made to correct such errors.  Patient was seen in collaboration with Dr. Virgina Jock discussed plan and  he is in agreement.   Alethia Berthold, PA-C 08/24/2020, 10:57 AM Office: [phone]

## 2020-08-24 ENCOUNTER — Ambulatory Visit: Payer: Medicare Other | Admitting: Student

## 2020-08-24 ENCOUNTER — Encounter: Payer: Self-pay | Admitting: Student

## 2020-08-24 ENCOUNTER — Other Ambulatory Visit: Payer: Self-pay

## 2020-08-24 VITALS — BP 138/68 | HR 88 | Resp 16 | Ht 72.0 in | Wt 270.0 lb

## 2020-08-24 DIAGNOSIS — Z7901 Long term (current) use of anticoagulants: Secondary | ICD-10-CM

## 2020-08-24 DIAGNOSIS — I2510 Atherosclerotic heart disease of native coronary artery without angina pectoris: Secondary | ICD-10-CM

## 2020-08-24 DIAGNOSIS — Z950 Presence of cardiac pacemaker: Secondary | ICD-10-CM | POA: Diagnosis not present

## 2020-08-24 DIAGNOSIS — I10 Essential (primary) hypertension: Secondary | ICD-10-CM

## 2020-08-24 DIAGNOSIS — I480 Paroxysmal atrial fibrillation: Secondary | ICD-10-CM

## 2020-08-24 MED ORDER — APIXABAN 5 MG PO TABS: 5.0000 mg | ORAL_TABLET | Freq: Two times a day (BID) | ORAL | 0 refills | Status: DC

## 2020-08-24 MED ORDER — AMLODIPINE BESYLATE 5 MG PO TABS: 5.0000 mg | ORAL_TABLET | Freq: Every day | ORAL | 3 refills | Status: DC

## 2020-08-24 MED ORDER — METOPROLOL TARTRATE 100 MG PO TABS: 100.0000 mg | ORAL_TABLET | Freq: Two times a day (BID) | ORAL | 3 refills | Status: DC

## 2020-08-24 MED ORDER — ATORVASTATIN CALCIUM 20 MG PO TABS: 20.0000 mg | ORAL_TABLET | Freq: Every day | ORAL | 2 refills | Status: DC

## 2020-08-24 MED ORDER — HYDRALAZINE HCL 100 MG PO TABS: 100.0000 mg | ORAL_TABLET | Freq: Two times a day (BID) | ORAL | 3 refills | Status: DC

## 2020-08-25 DIAGNOSIS — D631 Anemia in chronic kidney disease: Secondary | ICD-10-CM | POA: Diagnosis not present

## 2020-08-25 DIAGNOSIS — Z23 Encounter for immunization: Secondary | ICD-10-CM | POA: Diagnosis not present

## 2020-08-25 DIAGNOSIS — T8249XA Other complication of vascular dialysis catheter, initial encounter: Secondary | ICD-10-CM | POA: Diagnosis not present

## 2020-08-25 DIAGNOSIS — D689 Coagulation defect, unspecified: Secondary | ICD-10-CM | POA: Diagnosis not present

## 2020-08-25 DIAGNOSIS — D688 Other specified coagulation defects: Secondary | ICD-10-CM | POA: Diagnosis not present

## 2020-08-25 DIAGNOSIS — N2581 Secondary hyperparathyroidism of renal origin: Secondary | ICD-10-CM | POA: Diagnosis not present

## 2020-08-25 DIAGNOSIS — D509 Iron deficiency anemia, unspecified: Secondary | ICD-10-CM | POA: Diagnosis not present

## 2020-08-25 DIAGNOSIS — N186 End stage renal disease: Secondary | ICD-10-CM | POA: Diagnosis not present

## 2020-08-25 DIAGNOSIS — Z992 Dependence on renal dialysis: Secondary | ICD-10-CM | POA: Diagnosis not present

## 2020-08-29 DIAGNOSIS — D688 Other specified coagulation defects: Secondary | ICD-10-CM | POA: Diagnosis not present

## 2020-08-29 DIAGNOSIS — D509 Iron deficiency anemia, unspecified: Secondary | ICD-10-CM | POA: Diagnosis not present

## 2020-08-29 DIAGNOSIS — N2581 Secondary hyperparathyroidism of renal origin: Secondary | ICD-10-CM | POA: Diagnosis not present

## 2020-08-29 DIAGNOSIS — Z23 Encounter for immunization: Secondary | ICD-10-CM | POA: Diagnosis not present

## 2020-08-29 DIAGNOSIS — N186 End stage renal disease: Secondary | ICD-10-CM | POA: Diagnosis not present

## 2020-08-29 DIAGNOSIS — Z992 Dependence on renal dialysis: Secondary | ICD-10-CM | POA: Diagnosis not present

## 2020-08-29 DIAGNOSIS — D631 Anemia in chronic kidney disease: Secondary | ICD-10-CM | POA: Diagnosis not present

## 2020-08-29 DIAGNOSIS — D689 Coagulation defect, unspecified: Secondary | ICD-10-CM | POA: Diagnosis not present

## 2020-08-29 DIAGNOSIS — T8249XA Other complication of vascular dialysis catheter, initial encounter: Secondary | ICD-10-CM | POA: Diagnosis not present

## 2020-08-30 DIAGNOSIS — T8249XA Other complication of vascular dialysis catheter, initial encounter: Secondary | ICD-10-CM | POA: Diagnosis not present

## 2020-08-30 DIAGNOSIS — Z992 Dependence on renal dialysis: Secondary | ICD-10-CM | POA: Diagnosis not present

## 2020-08-30 DIAGNOSIS — D688 Other specified coagulation defects: Secondary | ICD-10-CM | POA: Diagnosis not present

## 2020-08-30 DIAGNOSIS — N2581 Secondary hyperparathyroidism of renal origin: Secondary | ICD-10-CM | POA: Diagnosis not present

## 2020-08-30 DIAGNOSIS — D509 Iron deficiency anemia, unspecified: Secondary | ICD-10-CM | POA: Diagnosis not present

## 2020-08-30 DIAGNOSIS — D689 Coagulation defect, unspecified: Secondary | ICD-10-CM | POA: Diagnosis not present

## 2020-08-30 DIAGNOSIS — Z23 Encounter for immunization: Secondary | ICD-10-CM | POA: Diagnosis not present

## 2020-08-30 DIAGNOSIS — N186 End stage renal disease: Secondary | ICD-10-CM | POA: Diagnosis not present

## 2020-08-30 DIAGNOSIS — D631 Anemia in chronic kidney disease: Secondary | ICD-10-CM | POA: Diagnosis not present

## 2020-09-01 DIAGNOSIS — D688 Other specified coagulation defects: Secondary | ICD-10-CM | POA: Diagnosis not present

## 2020-09-01 DIAGNOSIS — D689 Coagulation defect, unspecified: Secondary | ICD-10-CM | POA: Diagnosis not present

## 2020-09-01 DIAGNOSIS — D509 Iron deficiency anemia, unspecified: Secondary | ICD-10-CM | POA: Diagnosis not present

## 2020-09-01 DIAGNOSIS — D631 Anemia in chronic kidney disease: Secondary | ICD-10-CM | POA: Diagnosis not present

## 2020-09-01 DIAGNOSIS — T8249XA Other complication of vascular dialysis catheter, initial encounter: Secondary | ICD-10-CM | POA: Diagnosis not present

## 2020-09-01 DIAGNOSIS — Z23 Encounter for immunization: Secondary | ICD-10-CM | POA: Diagnosis not present

## 2020-09-01 DIAGNOSIS — N186 End stage renal disease: Secondary | ICD-10-CM | POA: Diagnosis not present

## 2020-09-01 DIAGNOSIS — N2581 Secondary hyperparathyroidism of renal origin: Secondary | ICD-10-CM | POA: Diagnosis not present

## 2020-09-01 DIAGNOSIS — Z992 Dependence on renal dialysis: Secondary | ICD-10-CM | POA: Diagnosis not present

## 2020-09-03 DIAGNOSIS — D688 Other specified coagulation defects: Secondary | ICD-10-CM | POA: Diagnosis not present

## 2020-09-03 DIAGNOSIS — D509 Iron deficiency anemia, unspecified: Secondary | ICD-10-CM | POA: Diagnosis not present

## 2020-09-03 DIAGNOSIS — D631 Anemia in chronic kidney disease: Secondary | ICD-10-CM | POA: Diagnosis not present

## 2020-09-03 DIAGNOSIS — N2581 Secondary hyperparathyroidism of renal origin: Secondary | ICD-10-CM | POA: Diagnosis not present

## 2020-09-03 DIAGNOSIS — N186 End stage renal disease: Secondary | ICD-10-CM | POA: Diagnosis not present

## 2020-09-03 DIAGNOSIS — T8249XA Other complication of vascular dialysis catheter, initial encounter: Secondary | ICD-10-CM | POA: Diagnosis not present

## 2020-09-03 DIAGNOSIS — Z23 Encounter for immunization: Secondary | ICD-10-CM | POA: Diagnosis not present

## 2020-09-03 DIAGNOSIS — Z992 Dependence on renal dialysis: Secondary | ICD-10-CM | POA: Diagnosis not present

## 2020-09-03 DIAGNOSIS — D689 Coagulation defect, unspecified: Secondary | ICD-10-CM | POA: Diagnosis not present

## 2020-09-06 DIAGNOSIS — T8249XA Other complication of vascular dialysis catheter, initial encounter: Secondary | ICD-10-CM | POA: Diagnosis not present

## 2020-09-06 DIAGNOSIS — Z992 Dependence on renal dialysis: Secondary | ICD-10-CM | POA: Diagnosis not present

## 2020-09-06 DIAGNOSIS — D509 Iron deficiency anemia, unspecified: Secondary | ICD-10-CM | POA: Diagnosis not present

## 2020-09-06 DIAGNOSIS — N186 End stage renal disease: Secondary | ICD-10-CM | POA: Diagnosis not present

## 2020-09-06 DIAGNOSIS — D689 Coagulation defect, unspecified: Secondary | ICD-10-CM | POA: Diagnosis not present

## 2020-09-06 DIAGNOSIS — N2581 Secondary hyperparathyroidism of renal origin: Secondary | ICD-10-CM | POA: Diagnosis not present

## 2020-09-06 DIAGNOSIS — D631 Anemia in chronic kidney disease: Secondary | ICD-10-CM | POA: Diagnosis not present

## 2020-09-06 DIAGNOSIS — D688 Other specified coagulation defects: Secondary | ICD-10-CM | POA: Diagnosis not present

## 2020-09-06 DIAGNOSIS — Z23 Encounter for immunization: Secondary | ICD-10-CM | POA: Diagnosis not present

## 2020-09-08 DIAGNOSIS — D688 Other specified coagulation defects: Secondary | ICD-10-CM | POA: Diagnosis not present

## 2020-09-08 DIAGNOSIS — D689 Coagulation defect, unspecified: Secondary | ICD-10-CM | POA: Diagnosis not present

## 2020-09-08 DIAGNOSIS — Z23 Encounter for immunization: Secondary | ICD-10-CM | POA: Diagnosis not present

## 2020-09-08 DIAGNOSIS — D509 Iron deficiency anemia, unspecified: Secondary | ICD-10-CM | POA: Diagnosis not present

## 2020-09-08 DIAGNOSIS — Z992 Dependence on renal dialysis: Secondary | ICD-10-CM | POA: Diagnosis not present

## 2020-09-08 DIAGNOSIS — N186 End stage renal disease: Secondary | ICD-10-CM | POA: Diagnosis not present

## 2020-09-08 DIAGNOSIS — D631 Anemia in chronic kidney disease: Secondary | ICD-10-CM | POA: Diagnosis not present

## 2020-09-08 DIAGNOSIS — N2581 Secondary hyperparathyroidism of renal origin: Secondary | ICD-10-CM | POA: Diagnosis not present

## 2020-09-08 DIAGNOSIS — T8249XA Other complication of vascular dialysis catheter, initial encounter: Secondary | ICD-10-CM | POA: Diagnosis not present

## 2020-09-10 DIAGNOSIS — D509 Iron deficiency anemia, unspecified: Secondary | ICD-10-CM | POA: Diagnosis not present

## 2020-09-10 DIAGNOSIS — Z23 Encounter for immunization: Secondary | ICD-10-CM | POA: Diagnosis not present

## 2020-09-10 DIAGNOSIS — D631 Anemia in chronic kidney disease: Secondary | ICD-10-CM | POA: Diagnosis not present

## 2020-09-10 DIAGNOSIS — N186 End stage renal disease: Secondary | ICD-10-CM | POA: Diagnosis not present

## 2020-09-10 DIAGNOSIS — N2581 Secondary hyperparathyroidism of renal origin: Secondary | ICD-10-CM | POA: Diagnosis not present

## 2020-09-10 DIAGNOSIS — D688 Other specified coagulation defects: Secondary | ICD-10-CM | POA: Diagnosis not present

## 2020-09-10 DIAGNOSIS — Z992 Dependence on renal dialysis: Secondary | ICD-10-CM | POA: Diagnosis not present

## 2020-09-10 DIAGNOSIS — D689 Coagulation defect, unspecified: Secondary | ICD-10-CM | POA: Diagnosis not present

## 2020-09-10 DIAGNOSIS — T8249XA Other complication of vascular dialysis catheter, initial encounter: Secondary | ICD-10-CM | POA: Diagnosis not present

## 2020-09-11 DIAGNOSIS — N179 Acute kidney failure, unspecified: Secondary | ICD-10-CM | POA: Diagnosis not present

## 2020-09-11 DIAGNOSIS — N186 End stage renal disease: Secondary | ICD-10-CM | POA: Diagnosis not present

## 2020-09-11 DIAGNOSIS — Z992 Dependence on renal dialysis: Secondary | ICD-10-CM | POA: Diagnosis not present

## 2020-09-13 DIAGNOSIS — D689 Coagulation defect, unspecified: Secondary | ICD-10-CM | POA: Diagnosis not present

## 2020-09-13 DIAGNOSIS — T8249XA Other complication of vascular dialysis catheter, initial encounter: Secondary | ICD-10-CM | POA: Diagnosis not present

## 2020-09-13 DIAGNOSIS — N2581 Secondary hyperparathyroidism of renal origin: Secondary | ICD-10-CM | POA: Diagnosis not present

## 2020-09-13 DIAGNOSIS — D631 Anemia in chronic kidney disease: Secondary | ICD-10-CM | POA: Diagnosis not present

## 2020-09-13 DIAGNOSIS — N186 End stage renal disease: Secondary | ICD-10-CM | POA: Diagnosis not present

## 2020-09-13 DIAGNOSIS — D509 Iron deficiency anemia, unspecified: Secondary | ICD-10-CM | POA: Diagnosis not present

## 2020-09-13 DIAGNOSIS — Z992 Dependence on renal dialysis: Secondary | ICD-10-CM | POA: Diagnosis not present

## 2020-09-15 DIAGNOSIS — N186 End stage renal disease: Secondary | ICD-10-CM | POA: Diagnosis not present

## 2020-09-15 DIAGNOSIS — N2581 Secondary hyperparathyroidism of renal origin: Secondary | ICD-10-CM | POA: Diagnosis not present

## 2020-09-15 DIAGNOSIS — D689 Coagulation defect, unspecified: Secondary | ICD-10-CM | POA: Diagnosis not present

## 2020-09-15 DIAGNOSIS — Z992 Dependence on renal dialysis: Secondary | ICD-10-CM | POA: Diagnosis not present

## 2020-09-15 DIAGNOSIS — D509 Iron deficiency anemia, unspecified: Secondary | ICD-10-CM | POA: Diagnosis not present

## 2020-09-15 DIAGNOSIS — T8249XA Other complication of vascular dialysis catheter, initial encounter: Secondary | ICD-10-CM | POA: Diagnosis not present

## 2020-09-15 DIAGNOSIS — D631 Anemia in chronic kidney disease: Secondary | ICD-10-CM | POA: Diagnosis not present

## 2020-09-16 DIAGNOSIS — Z89511 Acquired absence of right leg below knee: Secondary | ICD-10-CM | POA: Diagnosis not present

## 2020-09-16 DIAGNOSIS — L97509 Non-pressure chronic ulcer of other part of unspecified foot with unspecified severity: Secondary | ICD-10-CM | POA: Diagnosis not present

## 2020-09-16 DIAGNOSIS — I96 Gangrene, not elsewhere classified: Secondary | ICD-10-CM | POA: Diagnosis not present

## 2020-09-16 DIAGNOSIS — M86071 Acute hematogenous osteomyelitis, right ankle and foot: Secondary | ICD-10-CM | POA: Diagnosis not present

## 2020-09-17 DIAGNOSIS — D689 Coagulation defect, unspecified: Secondary | ICD-10-CM | POA: Diagnosis not present

## 2020-09-17 DIAGNOSIS — T8249XA Other complication of vascular dialysis catheter, initial encounter: Secondary | ICD-10-CM | POA: Diagnosis not present

## 2020-09-17 DIAGNOSIS — N2581 Secondary hyperparathyroidism of renal origin: Secondary | ICD-10-CM | POA: Diagnosis not present

## 2020-09-17 DIAGNOSIS — D631 Anemia in chronic kidney disease: Secondary | ICD-10-CM | POA: Diagnosis not present

## 2020-09-17 DIAGNOSIS — Z992 Dependence on renal dialysis: Secondary | ICD-10-CM | POA: Diagnosis not present

## 2020-09-17 DIAGNOSIS — D509 Iron deficiency anemia, unspecified: Secondary | ICD-10-CM | POA: Diagnosis not present

## 2020-09-17 DIAGNOSIS — N186 End stage renal disease: Secondary | ICD-10-CM | POA: Diagnosis not present

## 2020-09-20 DIAGNOSIS — N2581 Secondary hyperparathyroidism of renal origin: Secondary | ICD-10-CM | POA: Diagnosis not present

## 2020-09-20 DIAGNOSIS — N186 End stage renal disease: Secondary | ICD-10-CM | POA: Diagnosis not present

## 2020-09-20 DIAGNOSIS — D509 Iron deficiency anemia, unspecified: Secondary | ICD-10-CM | POA: Diagnosis not present

## 2020-09-20 DIAGNOSIS — T8249XA Other complication of vascular dialysis catheter, initial encounter: Secondary | ICD-10-CM | POA: Diagnosis not present

## 2020-09-20 DIAGNOSIS — D631 Anemia in chronic kidney disease: Secondary | ICD-10-CM | POA: Diagnosis not present

## 2020-09-20 DIAGNOSIS — Z992 Dependence on renal dialysis: Secondary | ICD-10-CM | POA: Diagnosis not present

## 2020-09-20 DIAGNOSIS — D689 Coagulation defect, unspecified: Secondary | ICD-10-CM | POA: Diagnosis not present

## 2020-09-22 ENCOUNTER — Telehealth: Payer: Self-pay | Admitting: Cardiology

## 2020-09-23 NOTE — Telephone Encounter (Signed)
Patient aware of pacemaker functioning normally. He remains asymptomatic. Counseled him regarding AF burden and to notify the office if he experiences symptoms. Patient verbalized understanding and agreement.

## 2020-09-24 DIAGNOSIS — D631 Anemia in chronic kidney disease: Secondary | ICD-10-CM | POA: Diagnosis not present

## 2020-09-24 DIAGNOSIS — D689 Coagulation defect, unspecified: Secondary | ICD-10-CM | POA: Diagnosis not present

## 2020-09-24 DIAGNOSIS — N2581 Secondary hyperparathyroidism of renal origin: Secondary | ICD-10-CM | POA: Diagnosis not present

## 2020-09-24 DIAGNOSIS — D509 Iron deficiency anemia, unspecified: Secondary | ICD-10-CM | POA: Diagnosis not present

## 2020-09-24 DIAGNOSIS — T8249XA Other complication of vascular dialysis catheter, initial encounter: Secondary | ICD-10-CM | POA: Diagnosis not present

## 2020-09-24 DIAGNOSIS — N186 End stage renal disease: Secondary | ICD-10-CM | POA: Diagnosis not present

## 2020-09-24 DIAGNOSIS — Z992 Dependence on renal dialysis: Secondary | ICD-10-CM | POA: Diagnosis not present

## 2020-09-27 DIAGNOSIS — T8249XA Other complication of vascular dialysis catheter, initial encounter: Secondary | ICD-10-CM | POA: Diagnosis not present

## 2020-09-27 DIAGNOSIS — N186 End stage renal disease: Secondary | ICD-10-CM | POA: Diagnosis not present

## 2020-09-27 DIAGNOSIS — D689 Coagulation defect, unspecified: Secondary | ICD-10-CM | POA: Diagnosis not present

## 2020-09-27 DIAGNOSIS — N2581 Secondary hyperparathyroidism of renal origin: Secondary | ICD-10-CM | POA: Diagnosis not present

## 2020-09-27 DIAGNOSIS — D631 Anemia in chronic kidney disease: Secondary | ICD-10-CM | POA: Diagnosis not present

## 2020-09-27 DIAGNOSIS — Z992 Dependence on renal dialysis: Secondary | ICD-10-CM | POA: Diagnosis not present

## 2020-09-27 DIAGNOSIS — D509 Iron deficiency anemia, unspecified: Secondary | ICD-10-CM | POA: Diagnosis not present

## 2020-09-29 DIAGNOSIS — D631 Anemia in chronic kidney disease: Secondary | ICD-10-CM | POA: Diagnosis not present

## 2020-09-29 DIAGNOSIS — D689 Coagulation defect, unspecified: Secondary | ICD-10-CM | POA: Diagnosis not present

## 2020-09-29 DIAGNOSIS — D509 Iron deficiency anemia, unspecified: Secondary | ICD-10-CM | POA: Diagnosis not present

## 2020-09-29 DIAGNOSIS — N186 End stage renal disease: Secondary | ICD-10-CM | POA: Diagnosis not present

## 2020-09-29 DIAGNOSIS — Z992 Dependence on renal dialysis: Secondary | ICD-10-CM | POA: Diagnosis not present

## 2020-09-29 DIAGNOSIS — T8249XA Other complication of vascular dialysis catheter, initial encounter: Secondary | ICD-10-CM | POA: Diagnosis not present

## 2020-09-29 DIAGNOSIS — N2581 Secondary hyperparathyroidism of renal origin: Secondary | ICD-10-CM | POA: Diagnosis not present

## 2020-10-01 DIAGNOSIS — T8249XA Other complication of vascular dialysis catheter, initial encounter: Secondary | ICD-10-CM | POA: Diagnosis not present

## 2020-10-01 DIAGNOSIS — D509 Iron deficiency anemia, unspecified: Secondary | ICD-10-CM | POA: Diagnosis not present

## 2020-10-01 DIAGNOSIS — N186 End stage renal disease: Secondary | ICD-10-CM | POA: Diagnosis not present

## 2020-10-01 DIAGNOSIS — N2581 Secondary hyperparathyroidism of renal origin: Secondary | ICD-10-CM | POA: Diagnosis not present

## 2020-10-01 DIAGNOSIS — D631 Anemia in chronic kidney disease: Secondary | ICD-10-CM | POA: Diagnosis not present

## 2020-10-01 DIAGNOSIS — Z992 Dependence on renal dialysis: Secondary | ICD-10-CM | POA: Diagnosis not present

## 2020-10-01 DIAGNOSIS — D689 Coagulation defect, unspecified: Secondary | ICD-10-CM | POA: Diagnosis not present

## 2020-10-03 DIAGNOSIS — T8249XA Other complication of vascular dialysis catheter, initial encounter: Secondary | ICD-10-CM | POA: Diagnosis not present

## 2020-10-03 DIAGNOSIS — N186 End stage renal disease: Secondary | ICD-10-CM | POA: Diagnosis not present

## 2020-10-03 DIAGNOSIS — D689 Coagulation defect, unspecified: Secondary | ICD-10-CM | POA: Diagnosis not present

## 2020-10-03 DIAGNOSIS — D509 Iron deficiency anemia, unspecified: Secondary | ICD-10-CM | POA: Diagnosis not present

## 2020-10-03 DIAGNOSIS — D631 Anemia in chronic kidney disease: Secondary | ICD-10-CM | POA: Diagnosis not present

## 2020-10-03 DIAGNOSIS — N2581 Secondary hyperparathyroidism of renal origin: Secondary | ICD-10-CM | POA: Diagnosis not present

## 2020-10-03 DIAGNOSIS — Z992 Dependence on renal dialysis: Secondary | ICD-10-CM | POA: Diagnosis not present

## 2020-10-05 DIAGNOSIS — T8249XA Other complication of vascular dialysis catheter, initial encounter: Secondary | ICD-10-CM | POA: Diagnosis not present

## 2020-10-05 DIAGNOSIS — D689 Coagulation defect, unspecified: Secondary | ICD-10-CM | POA: Diagnosis not present

## 2020-10-05 DIAGNOSIS — N2581 Secondary hyperparathyroidism of renal origin: Secondary | ICD-10-CM | POA: Diagnosis not present

## 2020-10-05 DIAGNOSIS — N186 End stage renal disease: Secondary | ICD-10-CM | POA: Diagnosis not present

## 2020-10-05 DIAGNOSIS — D631 Anemia in chronic kidney disease: Secondary | ICD-10-CM | POA: Diagnosis not present

## 2020-10-05 DIAGNOSIS — D509 Iron deficiency anemia, unspecified: Secondary | ICD-10-CM | POA: Diagnosis not present

## 2020-10-05 DIAGNOSIS — Z992 Dependence on renal dialysis: Secondary | ICD-10-CM | POA: Diagnosis not present

## 2020-10-08 DIAGNOSIS — N186 End stage renal disease: Secondary | ICD-10-CM | POA: Diagnosis not present

## 2020-10-08 DIAGNOSIS — T8249XA Other complication of vascular dialysis catheter, initial encounter: Secondary | ICD-10-CM | POA: Diagnosis not present

## 2020-10-08 DIAGNOSIS — Z992 Dependence on renal dialysis: Secondary | ICD-10-CM | POA: Diagnosis not present

## 2020-10-08 DIAGNOSIS — D509 Iron deficiency anemia, unspecified: Secondary | ICD-10-CM | POA: Diagnosis not present

## 2020-10-08 DIAGNOSIS — N2581 Secondary hyperparathyroidism of renal origin: Secondary | ICD-10-CM | POA: Diagnosis not present

## 2020-10-08 DIAGNOSIS — D631 Anemia in chronic kidney disease: Secondary | ICD-10-CM | POA: Diagnosis not present

## 2020-10-08 DIAGNOSIS — D689 Coagulation defect, unspecified: Secondary | ICD-10-CM | POA: Diagnosis not present

## 2020-10-11 DIAGNOSIS — N186 End stage renal disease: Secondary | ICD-10-CM | POA: Diagnosis not present

## 2020-10-11 DIAGNOSIS — N2581 Secondary hyperparathyroidism of renal origin: Secondary | ICD-10-CM | POA: Diagnosis not present

## 2020-10-11 DIAGNOSIS — Z992 Dependence on renal dialysis: Secondary | ICD-10-CM | POA: Diagnosis not present

## 2020-10-11 DIAGNOSIS — D509 Iron deficiency anemia, unspecified: Secondary | ICD-10-CM | POA: Diagnosis not present

## 2020-10-11 DIAGNOSIS — D689 Coagulation defect, unspecified: Secondary | ICD-10-CM | POA: Diagnosis not present

## 2020-10-11 DIAGNOSIS — N179 Acute kidney failure, unspecified: Secondary | ICD-10-CM | POA: Diagnosis not present

## 2020-10-11 DIAGNOSIS — D631 Anemia in chronic kidney disease: Secondary | ICD-10-CM | POA: Diagnosis not present

## 2020-10-11 DIAGNOSIS — T8249XA Other complication of vascular dialysis catheter, initial encounter: Secondary | ICD-10-CM | POA: Diagnosis not present

## 2020-10-13 DIAGNOSIS — D688 Other specified coagulation defects: Secondary | ICD-10-CM | POA: Diagnosis not present

## 2020-10-13 DIAGNOSIS — T8249XA Other complication of vascular dialysis catheter, initial encounter: Secondary | ICD-10-CM | POA: Diagnosis not present

## 2020-10-13 DIAGNOSIS — D689 Coagulation defect, unspecified: Secondary | ICD-10-CM | POA: Diagnosis not present

## 2020-10-13 DIAGNOSIS — N2581 Secondary hyperparathyroidism of renal origin: Secondary | ICD-10-CM | POA: Diagnosis not present

## 2020-10-13 DIAGNOSIS — Z23 Encounter for immunization: Secondary | ICD-10-CM | POA: Diagnosis not present

## 2020-10-13 DIAGNOSIS — D631 Anemia in chronic kidney disease: Secondary | ICD-10-CM | POA: Diagnosis not present

## 2020-10-13 DIAGNOSIS — N186 End stage renal disease: Secondary | ICD-10-CM | POA: Diagnosis not present

## 2020-10-13 DIAGNOSIS — Z992 Dependence on renal dialysis: Secondary | ICD-10-CM | POA: Diagnosis not present

## 2020-10-13 DIAGNOSIS — D509 Iron deficiency anemia, unspecified: Secondary | ICD-10-CM | POA: Diagnosis not present

## 2020-10-15 DIAGNOSIS — D631 Anemia in chronic kidney disease: Secondary | ICD-10-CM | POA: Diagnosis not present

## 2020-10-15 DIAGNOSIS — D509 Iron deficiency anemia, unspecified: Secondary | ICD-10-CM | POA: Diagnosis not present

## 2020-10-15 DIAGNOSIS — N186 End stage renal disease: Secondary | ICD-10-CM | POA: Diagnosis not present

## 2020-10-15 DIAGNOSIS — N2581 Secondary hyperparathyroidism of renal origin: Secondary | ICD-10-CM | POA: Diagnosis not present

## 2020-10-15 DIAGNOSIS — T8249XA Other complication of vascular dialysis catheter, initial encounter: Secondary | ICD-10-CM | POA: Diagnosis not present

## 2020-10-15 DIAGNOSIS — Z992 Dependence on renal dialysis: Secondary | ICD-10-CM | POA: Diagnosis not present

## 2020-10-15 DIAGNOSIS — D688 Other specified coagulation defects: Secondary | ICD-10-CM | POA: Diagnosis not present

## 2020-10-15 DIAGNOSIS — Z23 Encounter for immunization: Secondary | ICD-10-CM | POA: Diagnosis not present

## 2020-10-15 DIAGNOSIS — D689 Coagulation defect, unspecified: Secondary | ICD-10-CM | POA: Diagnosis not present

## 2020-10-16 DIAGNOSIS — M86071 Acute hematogenous osteomyelitis, right ankle and foot: Secondary | ICD-10-CM | POA: Diagnosis not present

## 2020-10-16 DIAGNOSIS — Z89511 Acquired absence of right leg below knee: Secondary | ICD-10-CM | POA: Diagnosis not present

## 2020-10-16 DIAGNOSIS — I96 Gangrene, not elsewhere classified: Secondary | ICD-10-CM | POA: Diagnosis not present

## 2020-10-16 DIAGNOSIS — L97509 Non-pressure chronic ulcer of other part of unspecified foot with unspecified severity: Secondary | ICD-10-CM | POA: Diagnosis not present

## 2020-10-17 DIAGNOSIS — E7800 Pure hypercholesterolemia, unspecified: Secondary | ICD-10-CM | POA: Diagnosis not present

## 2020-10-17 DIAGNOSIS — Z125 Encounter for screening for malignant neoplasm of prostate: Secondary | ICD-10-CM | POA: Diagnosis not present

## 2020-10-17 DIAGNOSIS — E1151 Type 2 diabetes mellitus with diabetic peripheral angiopathy without gangrene: Secondary | ICD-10-CM | POA: Diagnosis not present

## 2020-10-18 DIAGNOSIS — N2581 Secondary hyperparathyroidism of renal origin: Secondary | ICD-10-CM | POA: Diagnosis not present

## 2020-10-18 DIAGNOSIS — N186 End stage renal disease: Secondary | ICD-10-CM | POA: Diagnosis not present

## 2020-10-18 DIAGNOSIS — D689 Coagulation defect, unspecified: Secondary | ICD-10-CM | POA: Diagnosis not present

## 2020-10-18 DIAGNOSIS — D631 Anemia in chronic kidney disease: Secondary | ICD-10-CM | POA: Diagnosis not present

## 2020-10-18 DIAGNOSIS — T8249XA Other complication of vascular dialysis catheter, initial encounter: Secondary | ICD-10-CM | POA: Diagnosis not present

## 2020-10-18 DIAGNOSIS — D688 Other specified coagulation defects: Secondary | ICD-10-CM | POA: Diagnosis not present

## 2020-10-18 DIAGNOSIS — D509 Iron deficiency anemia, unspecified: Secondary | ICD-10-CM | POA: Diagnosis not present

## 2020-10-18 DIAGNOSIS — Z23 Encounter for immunization: Secondary | ICD-10-CM | POA: Diagnosis not present

## 2020-10-18 DIAGNOSIS — Z992 Dependence on renal dialysis: Secondary | ICD-10-CM | POA: Diagnosis not present

## 2020-10-19 DIAGNOSIS — R82998 Other abnormal findings in urine: Secondary | ICD-10-CM | POA: Diagnosis not present

## 2020-10-19 DIAGNOSIS — E1151 Type 2 diabetes mellitus with diabetic peripheral angiopathy without gangrene: Secondary | ICD-10-CM | POA: Diagnosis not present

## 2020-10-19 DIAGNOSIS — Z0000 Encounter for general adult medical examination without abnormal findings: Secondary | ICD-10-CM | POA: Diagnosis not present

## 2020-10-19 DIAGNOSIS — Z1212 Encounter for screening for malignant neoplasm of rectum: Secondary | ICD-10-CM | POA: Diagnosis not present

## 2020-10-19 DIAGNOSIS — E1122 Type 2 diabetes mellitus with diabetic chronic kidney disease: Secondary | ICD-10-CM | POA: Diagnosis not present

## 2020-10-19 DIAGNOSIS — Z794 Long term (current) use of insulin: Secondary | ICD-10-CM | POA: Diagnosis not present

## 2020-10-20 DIAGNOSIS — T8249XA Other complication of vascular dialysis catheter, initial encounter: Secondary | ICD-10-CM | POA: Diagnosis not present

## 2020-10-20 DIAGNOSIS — Z23 Encounter for immunization: Secondary | ICD-10-CM | POA: Diagnosis not present

## 2020-10-20 DIAGNOSIS — D509 Iron deficiency anemia, unspecified: Secondary | ICD-10-CM | POA: Diagnosis not present

## 2020-10-20 DIAGNOSIS — Z950 Presence of cardiac pacemaker: Secondary | ICD-10-CM | POA: Diagnosis not present

## 2020-10-20 DIAGNOSIS — D631 Anemia in chronic kidney disease: Secondary | ICD-10-CM | POA: Diagnosis not present

## 2020-10-20 DIAGNOSIS — N2581 Secondary hyperparathyroidism of renal origin: Secondary | ICD-10-CM | POA: Diagnosis not present

## 2020-10-20 DIAGNOSIS — Z45018 Encounter for adjustment and management of other part of cardiac pacemaker: Secondary | ICD-10-CM | POA: Diagnosis not present

## 2020-10-20 DIAGNOSIS — D688 Other specified coagulation defects: Secondary | ICD-10-CM | POA: Diagnosis not present

## 2020-10-20 DIAGNOSIS — I495 Sick sinus syndrome: Secondary | ICD-10-CM | POA: Diagnosis not present

## 2020-10-20 DIAGNOSIS — Z992 Dependence on renal dialysis: Secondary | ICD-10-CM | POA: Diagnosis not present

## 2020-10-20 DIAGNOSIS — D689 Coagulation defect, unspecified: Secondary | ICD-10-CM | POA: Diagnosis not present

## 2020-10-20 DIAGNOSIS — N186 End stage renal disease: Secondary | ICD-10-CM | POA: Diagnosis not present

## 2020-10-22 DIAGNOSIS — D689 Coagulation defect, unspecified: Secondary | ICD-10-CM | POA: Diagnosis not present

## 2020-10-22 DIAGNOSIS — N186 End stage renal disease: Secondary | ICD-10-CM | POA: Diagnosis not present

## 2020-10-22 DIAGNOSIS — N2581 Secondary hyperparathyroidism of renal origin: Secondary | ICD-10-CM | POA: Diagnosis not present

## 2020-10-22 DIAGNOSIS — Z23 Encounter for immunization: Secondary | ICD-10-CM | POA: Diagnosis not present

## 2020-10-22 DIAGNOSIS — T8249XA Other complication of vascular dialysis catheter, initial encounter: Secondary | ICD-10-CM | POA: Diagnosis not present

## 2020-10-22 DIAGNOSIS — D688 Other specified coagulation defects: Secondary | ICD-10-CM | POA: Diagnosis not present

## 2020-10-22 DIAGNOSIS — Z992 Dependence on renal dialysis: Secondary | ICD-10-CM | POA: Diagnosis not present

## 2020-10-22 DIAGNOSIS — D509 Iron deficiency anemia, unspecified: Secondary | ICD-10-CM | POA: Diagnosis not present

## 2020-10-22 DIAGNOSIS — D631 Anemia in chronic kidney disease: Secondary | ICD-10-CM | POA: Diagnosis not present

## 2020-10-25 DIAGNOSIS — Z23 Encounter for immunization: Secondary | ICD-10-CM | POA: Diagnosis not present

## 2020-10-25 DIAGNOSIS — N2581 Secondary hyperparathyroidism of renal origin: Secondary | ICD-10-CM | POA: Diagnosis not present

## 2020-10-25 DIAGNOSIS — D509 Iron deficiency anemia, unspecified: Secondary | ICD-10-CM | POA: Diagnosis not present

## 2020-10-25 DIAGNOSIS — Z992 Dependence on renal dialysis: Secondary | ICD-10-CM | POA: Diagnosis not present

## 2020-10-25 DIAGNOSIS — D688 Other specified coagulation defects: Secondary | ICD-10-CM | POA: Diagnosis not present

## 2020-10-25 DIAGNOSIS — N186 End stage renal disease: Secondary | ICD-10-CM | POA: Diagnosis not present

## 2020-10-25 DIAGNOSIS — D689 Coagulation defect, unspecified: Secondary | ICD-10-CM | POA: Diagnosis not present

## 2020-10-25 DIAGNOSIS — T8249XA Other complication of vascular dialysis catheter, initial encounter: Secondary | ICD-10-CM | POA: Diagnosis not present

## 2020-10-25 DIAGNOSIS — D631 Anemia in chronic kidney disease: Secondary | ICD-10-CM | POA: Diagnosis not present

## 2020-10-26 ENCOUNTER — Other Ambulatory Visit: Payer: Self-pay | Admitting: Cardiology

## 2020-10-26 DIAGNOSIS — I10 Essential (primary) hypertension: Secondary | ICD-10-CM

## 2020-10-26 DIAGNOSIS — I2510 Atherosclerotic heart disease of native coronary artery without angina pectoris: Secondary | ICD-10-CM

## 2020-10-27 DIAGNOSIS — D631 Anemia in chronic kidney disease: Secondary | ICD-10-CM | POA: Diagnosis not present

## 2020-10-27 DIAGNOSIS — D689 Coagulation defect, unspecified: Secondary | ICD-10-CM | POA: Diagnosis not present

## 2020-10-27 DIAGNOSIS — D688 Other specified coagulation defects: Secondary | ICD-10-CM | POA: Diagnosis not present

## 2020-10-27 DIAGNOSIS — Z992 Dependence on renal dialysis: Secondary | ICD-10-CM | POA: Diagnosis not present

## 2020-10-27 DIAGNOSIS — D509 Iron deficiency anemia, unspecified: Secondary | ICD-10-CM | POA: Diagnosis not present

## 2020-10-27 DIAGNOSIS — N2581 Secondary hyperparathyroidism of renal origin: Secondary | ICD-10-CM | POA: Diagnosis not present

## 2020-10-27 DIAGNOSIS — Z23 Encounter for immunization: Secondary | ICD-10-CM | POA: Diagnosis not present

## 2020-10-27 DIAGNOSIS — N186 End stage renal disease: Secondary | ICD-10-CM | POA: Diagnosis not present

## 2020-10-27 DIAGNOSIS — T8249XA Other complication of vascular dialysis catheter, initial encounter: Secondary | ICD-10-CM | POA: Diagnosis not present

## 2020-10-29 DIAGNOSIS — N2581 Secondary hyperparathyroidism of renal origin: Secondary | ICD-10-CM | POA: Diagnosis not present

## 2020-10-29 DIAGNOSIS — T8249XA Other complication of vascular dialysis catheter, initial encounter: Secondary | ICD-10-CM | POA: Diagnosis not present

## 2020-10-29 DIAGNOSIS — D631 Anemia in chronic kidney disease: Secondary | ICD-10-CM | POA: Diagnosis not present

## 2020-10-29 DIAGNOSIS — Z23 Encounter for immunization: Secondary | ICD-10-CM | POA: Diagnosis not present

## 2020-10-29 DIAGNOSIS — N186 End stage renal disease: Secondary | ICD-10-CM | POA: Diagnosis not present

## 2020-10-29 DIAGNOSIS — D509 Iron deficiency anemia, unspecified: Secondary | ICD-10-CM | POA: Diagnosis not present

## 2020-10-29 DIAGNOSIS — Z992 Dependence on renal dialysis: Secondary | ICD-10-CM | POA: Diagnosis not present

## 2020-10-29 DIAGNOSIS — D689 Coagulation defect, unspecified: Secondary | ICD-10-CM | POA: Diagnosis not present

## 2020-10-29 DIAGNOSIS — D688 Other specified coagulation defects: Secondary | ICD-10-CM | POA: Diagnosis not present

## 2020-11-01 DIAGNOSIS — N2581 Secondary hyperparathyroidism of renal origin: Secondary | ICD-10-CM | POA: Diagnosis not present

## 2020-11-01 DIAGNOSIS — T8249XA Other complication of vascular dialysis catheter, initial encounter: Secondary | ICD-10-CM | POA: Diagnosis not present

## 2020-11-01 DIAGNOSIS — N186 End stage renal disease: Secondary | ICD-10-CM | POA: Diagnosis not present

## 2020-11-01 DIAGNOSIS — Z23 Encounter for immunization: Secondary | ICD-10-CM | POA: Diagnosis not present

## 2020-11-01 DIAGNOSIS — D509 Iron deficiency anemia, unspecified: Secondary | ICD-10-CM | POA: Diagnosis not present

## 2020-11-01 DIAGNOSIS — Z992 Dependence on renal dialysis: Secondary | ICD-10-CM | POA: Diagnosis not present

## 2020-11-01 DIAGNOSIS — D688 Other specified coagulation defects: Secondary | ICD-10-CM | POA: Diagnosis not present

## 2020-11-01 DIAGNOSIS — D689 Coagulation defect, unspecified: Secondary | ICD-10-CM | POA: Diagnosis not present

## 2020-11-01 DIAGNOSIS — D631 Anemia in chronic kidney disease: Secondary | ICD-10-CM | POA: Diagnosis not present

## 2020-11-03 DIAGNOSIS — T8249XA Other complication of vascular dialysis catheter, initial encounter: Secondary | ICD-10-CM | POA: Diagnosis not present

## 2020-11-03 DIAGNOSIS — N186 End stage renal disease: Secondary | ICD-10-CM | POA: Diagnosis not present

## 2020-11-03 DIAGNOSIS — Z23 Encounter for immunization: Secondary | ICD-10-CM | POA: Diagnosis not present

## 2020-11-03 DIAGNOSIS — D631 Anemia in chronic kidney disease: Secondary | ICD-10-CM | POA: Diagnosis not present

## 2020-11-03 DIAGNOSIS — D689 Coagulation defect, unspecified: Secondary | ICD-10-CM | POA: Diagnosis not present

## 2020-11-03 DIAGNOSIS — N2581 Secondary hyperparathyroidism of renal origin: Secondary | ICD-10-CM | POA: Diagnosis not present

## 2020-11-03 DIAGNOSIS — D509 Iron deficiency anemia, unspecified: Secondary | ICD-10-CM | POA: Diagnosis not present

## 2020-11-03 DIAGNOSIS — Z992 Dependence on renal dialysis: Secondary | ICD-10-CM | POA: Diagnosis not present

## 2020-11-03 DIAGNOSIS — D688 Other specified coagulation defects: Secondary | ICD-10-CM | POA: Diagnosis not present

## 2020-11-06 DIAGNOSIS — D509 Iron deficiency anemia, unspecified: Secondary | ICD-10-CM | POA: Diagnosis not present

## 2020-11-06 DIAGNOSIS — N2581 Secondary hyperparathyroidism of renal origin: Secondary | ICD-10-CM | POA: Diagnosis not present

## 2020-11-06 DIAGNOSIS — D631 Anemia in chronic kidney disease: Secondary | ICD-10-CM | POA: Diagnosis not present

## 2020-11-06 DIAGNOSIS — Z992 Dependence on renal dialysis: Secondary | ICD-10-CM | POA: Diagnosis not present

## 2020-11-06 DIAGNOSIS — N186 End stage renal disease: Secondary | ICD-10-CM | POA: Diagnosis not present

## 2020-11-06 DIAGNOSIS — Z23 Encounter for immunization: Secondary | ICD-10-CM | POA: Diagnosis not present

## 2020-11-06 DIAGNOSIS — D689 Coagulation defect, unspecified: Secondary | ICD-10-CM | POA: Diagnosis not present

## 2020-11-06 DIAGNOSIS — T8249XA Other complication of vascular dialysis catheter, initial encounter: Secondary | ICD-10-CM | POA: Diagnosis not present

## 2020-11-06 DIAGNOSIS — D688 Other specified coagulation defects: Secondary | ICD-10-CM | POA: Diagnosis not present

## 2020-11-08 DIAGNOSIS — D688 Other specified coagulation defects: Secondary | ICD-10-CM | POA: Diagnosis not present

## 2020-11-08 DIAGNOSIS — D509 Iron deficiency anemia, unspecified: Secondary | ICD-10-CM | POA: Diagnosis not present

## 2020-11-08 DIAGNOSIS — Z992 Dependence on renal dialysis: Secondary | ICD-10-CM | POA: Diagnosis not present

## 2020-11-08 DIAGNOSIS — N2581 Secondary hyperparathyroidism of renal origin: Secondary | ICD-10-CM | POA: Diagnosis not present

## 2020-11-08 DIAGNOSIS — D689 Coagulation defect, unspecified: Secondary | ICD-10-CM | POA: Diagnosis not present

## 2020-11-08 DIAGNOSIS — D631 Anemia in chronic kidney disease: Secondary | ICD-10-CM | POA: Diagnosis not present

## 2020-11-08 DIAGNOSIS — N186 End stage renal disease: Secondary | ICD-10-CM | POA: Diagnosis not present

## 2020-11-08 DIAGNOSIS — T8249XA Other complication of vascular dialysis catheter, initial encounter: Secondary | ICD-10-CM | POA: Diagnosis not present

## 2020-11-08 DIAGNOSIS — Z23 Encounter for immunization: Secondary | ICD-10-CM | POA: Diagnosis not present

## 2020-11-10 DIAGNOSIS — D509 Iron deficiency anemia, unspecified: Secondary | ICD-10-CM | POA: Diagnosis not present

## 2020-11-10 DIAGNOSIS — Z992 Dependence on renal dialysis: Secondary | ICD-10-CM | POA: Diagnosis not present

## 2020-11-10 DIAGNOSIS — T8249XA Other complication of vascular dialysis catheter, initial encounter: Secondary | ICD-10-CM | POA: Diagnosis not present

## 2020-11-10 DIAGNOSIS — D688 Other specified coagulation defects: Secondary | ICD-10-CM | POA: Diagnosis not present

## 2020-11-10 DIAGNOSIS — N186 End stage renal disease: Secondary | ICD-10-CM | POA: Diagnosis not present

## 2020-11-10 DIAGNOSIS — Z23 Encounter for immunization: Secondary | ICD-10-CM | POA: Diagnosis not present

## 2020-11-10 DIAGNOSIS — D689 Coagulation defect, unspecified: Secondary | ICD-10-CM | POA: Diagnosis not present

## 2020-11-10 DIAGNOSIS — N2581 Secondary hyperparathyroidism of renal origin: Secondary | ICD-10-CM | POA: Diagnosis not present

## 2020-11-10 DIAGNOSIS — D631 Anemia in chronic kidney disease: Secondary | ICD-10-CM | POA: Diagnosis not present

## 2020-11-11 DIAGNOSIS — N186 End stage renal disease: Secondary | ICD-10-CM | POA: Diagnosis not present

## 2020-11-11 DIAGNOSIS — Z992 Dependence on renal dialysis: Secondary | ICD-10-CM | POA: Diagnosis not present

## 2020-11-11 DIAGNOSIS — N179 Acute kidney failure, unspecified: Secondary | ICD-10-CM | POA: Diagnosis not present

## 2020-11-13 DIAGNOSIS — N186 End stage renal disease: Secondary | ICD-10-CM | POA: Diagnosis not present

## 2020-11-13 DIAGNOSIS — Z992 Dependence on renal dialysis: Secondary | ICD-10-CM | POA: Diagnosis not present

## 2020-11-13 DIAGNOSIS — N2581 Secondary hyperparathyroidism of renal origin: Secondary | ICD-10-CM | POA: Diagnosis not present

## 2020-11-13 DIAGNOSIS — D689 Coagulation defect, unspecified: Secondary | ICD-10-CM | POA: Diagnosis not present

## 2020-11-13 DIAGNOSIS — T8249XA Other complication of vascular dialysis catheter, initial encounter: Secondary | ICD-10-CM | POA: Diagnosis not present

## 2020-11-13 DIAGNOSIS — D631 Anemia in chronic kidney disease: Secondary | ICD-10-CM | POA: Diagnosis not present

## 2020-11-13 DIAGNOSIS — D509 Iron deficiency anemia, unspecified: Secondary | ICD-10-CM | POA: Diagnosis not present

## 2020-11-13 DIAGNOSIS — E0821 Diabetes mellitus due to underlying condition with diabetic nephropathy: Secondary | ICD-10-CM | POA: Diagnosis not present

## 2020-11-15 DIAGNOSIS — T8249XA Other complication of vascular dialysis catheter, initial encounter: Secondary | ICD-10-CM | POA: Diagnosis not present

## 2020-11-15 DIAGNOSIS — D509 Iron deficiency anemia, unspecified: Secondary | ICD-10-CM | POA: Diagnosis not present

## 2020-11-15 DIAGNOSIS — N186 End stage renal disease: Secondary | ICD-10-CM | POA: Diagnosis not present

## 2020-11-15 DIAGNOSIS — Z992 Dependence on renal dialysis: Secondary | ICD-10-CM | POA: Diagnosis not present

## 2020-11-15 DIAGNOSIS — D689 Coagulation defect, unspecified: Secondary | ICD-10-CM | POA: Diagnosis not present

## 2020-11-15 DIAGNOSIS — N2581 Secondary hyperparathyroidism of renal origin: Secondary | ICD-10-CM | POA: Diagnosis not present

## 2020-11-15 DIAGNOSIS — D631 Anemia in chronic kidney disease: Secondary | ICD-10-CM | POA: Diagnosis not present

## 2020-11-15 DIAGNOSIS — E0821 Diabetes mellitus due to underlying condition with diabetic nephropathy: Secondary | ICD-10-CM | POA: Diagnosis not present

## 2020-11-16 DIAGNOSIS — Z89511 Acquired absence of right leg below knee: Secondary | ICD-10-CM | POA: Diagnosis not present

## 2020-11-16 DIAGNOSIS — M86071 Acute hematogenous osteomyelitis, right ankle and foot: Secondary | ICD-10-CM | POA: Diagnosis not present

## 2020-11-16 DIAGNOSIS — L97509 Non-pressure chronic ulcer of other part of unspecified foot with unspecified severity: Secondary | ICD-10-CM | POA: Diagnosis not present

## 2020-11-16 DIAGNOSIS — I96 Gangrene, not elsewhere classified: Secondary | ICD-10-CM | POA: Diagnosis not present

## 2020-11-17 DIAGNOSIS — D509 Iron deficiency anemia, unspecified: Secondary | ICD-10-CM | POA: Diagnosis not present

## 2020-11-17 DIAGNOSIS — N186 End stage renal disease: Secondary | ICD-10-CM | POA: Diagnosis not present

## 2020-11-17 DIAGNOSIS — N2581 Secondary hyperparathyroidism of renal origin: Secondary | ICD-10-CM | POA: Diagnosis not present

## 2020-11-17 DIAGNOSIS — D631 Anemia in chronic kidney disease: Secondary | ICD-10-CM | POA: Diagnosis not present

## 2020-11-17 DIAGNOSIS — D689 Coagulation defect, unspecified: Secondary | ICD-10-CM | POA: Diagnosis not present

## 2020-11-17 DIAGNOSIS — E0821 Diabetes mellitus due to underlying condition with diabetic nephropathy: Secondary | ICD-10-CM | POA: Diagnosis not present

## 2020-11-17 DIAGNOSIS — T8249XA Other complication of vascular dialysis catheter, initial encounter: Secondary | ICD-10-CM | POA: Diagnosis not present

## 2020-11-17 DIAGNOSIS — Z992 Dependence on renal dialysis: Secondary | ICD-10-CM | POA: Diagnosis not present

## 2020-11-19 DIAGNOSIS — D689 Coagulation defect, unspecified: Secondary | ICD-10-CM | POA: Diagnosis not present

## 2020-11-19 DIAGNOSIS — N2581 Secondary hyperparathyroidism of renal origin: Secondary | ICD-10-CM | POA: Diagnosis not present

## 2020-11-19 DIAGNOSIS — N186 End stage renal disease: Secondary | ICD-10-CM | POA: Diagnosis not present

## 2020-11-19 DIAGNOSIS — T8249XA Other complication of vascular dialysis catheter, initial encounter: Secondary | ICD-10-CM | POA: Diagnosis not present

## 2020-11-19 DIAGNOSIS — Z992 Dependence on renal dialysis: Secondary | ICD-10-CM | POA: Diagnosis not present

## 2020-11-19 DIAGNOSIS — D631 Anemia in chronic kidney disease: Secondary | ICD-10-CM | POA: Diagnosis not present

## 2020-11-19 DIAGNOSIS — D509 Iron deficiency anemia, unspecified: Secondary | ICD-10-CM | POA: Diagnosis not present

## 2020-11-19 DIAGNOSIS — E0821 Diabetes mellitus due to underlying condition with diabetic nephropathy: Secondary | ICD-10-CM | POA: Diagnosis not present

## 2020-11-22 DIAGNOSIS — T8249XA Other complication of vascular dialysis catheter, initial encounter: Secondary | ICD-10-CM | POA: Diagnosis not present

## 2020-11-22 DIAGNOSIS — N186 End stage renal disease: Secondary | ICD-10-CM | POA: Diagnosis not present

## 2020-11-22 DIAGNOSIS — D509 Iron deficiency anemia, unspecified: Secondary | ICD-10-CM | POA: Diagnosis not present

## 2020-11-22 DIAGNOSIS — E0821 Diabetes mellitus due to underlying condition with diabetic nephropathy: Secondary | ICD-10-CM | POA: Diagnosis not present

## 2020-11-22 DIAGNOSIS — D631 Anemia in chronic kidney disease: Secondary | ICD-10-CM | POA: Diagnosis not present

## 2020-11-22 DIAGNOSIS — D689 Coagulation defect, unspecified: Secondary | ICD-10-CM | POA: Diagnosis not present

## 2020-11-22 DIAGNOSIS — N2581 Secondary hyperparathyroidism of renal origin: Secondary | ICD-10-CM | POA: Diagnosis not present

## 2020-11-22 DIAGNOSIS — Z992 Dependence on renal dialysis: Secondary | ICD-10-CM | POA: Diagnosis not present

## 2020-11-24 DIAGNOSIS — D509 Iron deficiency anemia, unspecified: Secondary | ICD-10-CM | POA: Diagnosis not present

## 2020-11-24 DIAGNOSIS — E0821 Diabetes mellitus due to underlying condition with diabetic nephropathy: Secondary | ICD-10-CM | POA: Diagnosis not present

## 2020-11-24 DIAGNOSIS — D631 Anemia in chronic kidney disease: Secondary | ICD-10-CM | POA: Diagnosis not present

## 2020-11-24 DIAGNOSIS — Z992 Dependence on renal dialysis: Secondary | ICD-10-CM | POA: Diagnosis not present

## 2020-11-24 DIAGNOSIS — N2581 Secondary hyperparathyroidism of renal origin: Secondary | ICD-10-CM | POA: Diagnosis not present

## 2020-11-24 DIAGNOSIS — N186 End stage renal disease: Secondary | ICD-10-CM | POA: Diagnosis not present

## 2020-11-24 DIAGNOSIS — T8249XA Other complication of vascular dialysis catheter, initial encounter: Secondary | ICD-10-CM | POA: Diagnosis not present

## 2020-11-24 DIAGNOSIS — D689 Coagulation defect, unspecified: Secondary | ICD-10-CM | POA: Diagnosis not present

## 2020-11-25 ENCOUNTER — Ambulatory Visit: Payer: Medicare Other | Admitting: Student

## 2020-11-26 DIAGNOSIS — N186 End stage renal disease: Secondary | ICD-10-CM | POA: Diagnosis not present

## 2020-11-26 DIAGNOSIS — Z992 Dependence on renal dialysis: Secondary | ICD-10-CM | POA: Diagnosis not present

## 2020-11-26 DIAGNOSIS — T8249XA Other complication of vascular dialysis catheter, initial encounter: Secondary | ICD-10-CM | POA: Diagnosis not present

## 2020-11-26 DIAGNOSIS — D509 Iron deficiency anemia, unspecified: Secondary | ICD-10-CM | POA: Diagnosis not present

## 2020-11-26 DIAGNOSIS — D689 Coagulation defect, unspecified: Secondary | ICD-10-CM | POA: Diagnosis not present

## 2020-11-26 DIAGNOSIS — D631 Anemia in chronic kidney disease: Secondary | ICD-10-CM | POA: Diagnosis not present

## 2020-11-26 DIAGNOSIS — N2581 Secondary hyperparathyroidism of renal origin: Secondary | ICD-10-CM | POA: Diagnosis not present

## 2020-11-26 DIAGNOSIS — E0821 Diabetes mellitus due to underlying condition with diabetic nephropathy: Secondary | ICD-10-CM | POA: Diagnosis not present

## 2020-11-28 NOTE — Progress Notes (Deleted)
Alexander [name] Date of Birth: [birth year] MRN: [id number] Primary Care Provider:Tisovec, [name], MD Former Cardiology Providers: Dr. Adrian [name], Alexander Lager, APRN, FNP-C, Dr. Terri [name] Primary Cardiologist: Alexander Berthold, PA-C, (established care 11/25/2019) Electrophysiologist: Dr. Cristopher [name]  Date: 08/24/2020 Last Office Visit: 07/27/2020  No chief complaint on file.   HPI  Alexander [name] is a 60 y.o.  male who presents to the office with a chief complaint of " abnormal stress test." Patient has a complex past medical history and multiple cardiovascular risk factors which include but not limited to: Paroxysmal atrial fibrillation, on oral anticoagulation, coronary artery disease ( angioplasty and stenting to the LAD and dominant circumflex in December 2015, diffusely diseased small RCA), peripheral artery disease with prior bypass, status post right BKA, pacemaker implantation secondary to complete heart block (implanted 10/07/2014), former smoker, insulin-dependent diabetes mellitus, alcohol use, hemodialysis, obesity due to excess calories.  Patient presents to the office accompanied by his daughter Alexander [name].  Nonsustained ventricular tachycardia was noted on pacemaker which triggered [name] to undergo stress test and echocardiogram.  Stress test was abnormal, noting reversible ischemia and LVEF per gated SPECT was reduced.  Patient underwent left heart catheterization.  Of note patient states that he was recently hospitalized for low hemoglobin.  He has undergone extensive GI evaluation including EGD, colonoscopy, and capsule study without explanation of etiology for anemia.  Patient presents for follow-up of left heart catheterization on 08/19/2020.  Left heart catheterization and coronary angiography revealed previously placed stents to LCx and LAD, mild to moderate residual diffuse disease in LAD and LCx, severe RCA disease.  Recommendation from cath was continued medical management.   Patient is presently doing well without complaints of angina, shortness of breath.  Denies swelling, palpitations, orthopnea, PND.  ***Patient presents for 3 month follow up of atrial fibrillation and coronary artery disease.   ALLERGIES: Allergies  Allergen Reactions  . Cytoxan [Cyclophosphamide]     pancytopenia  . Keflex [Cephalexin] Nausea And Vomiting  . Pletal [Cilostazol] Palpitations    "Can hear heart beating loudly".   MEDICATION LIST PRIOR TO VISIT: Current Outpatient Medications on File Prior to Visit  Medication Sig Dispense Refill  . ACCU-CHEK FASTCLIX LANCETS MISC Check blood sugar TID & QHS 102 each 2  . amLODipine (NORVASC) 5 MG tablet Take 1 tablet (5 mg total) by mouth daily. 90 tablet 3  . apixaban (ELIQUIS) 5 MG TABS tablet Take 1 tablet (5 mg total) by mouth 2 (two) times daily. 180 tablet 0  . atorvastatin (LIPITOR) 20 MG tablet Take 1 tablet by mouth once daily 90 tablet 0  . blood glucose meter kit and supplies KIT Check blood sugar TID & QHS 1 each 0  . Blood Glucose Monitoring Suppl (ACCU-CHEK ADVANTAGE DIABETES) kit Use as instructed 1 each 0  . glucose blood (ACCU-CHEK AVIVA PLUS) test strip Use as instructed 100 each 12  . glucose blood (CHOICE DM FORA G20 TEST STRIPS) test strip Check blood sugar TID & QHS 100 each 12  . glucose blood test strip Use as instructed 100 each 12  . hydrALAZINE (APRESOLINE) 100 MG tablet Take 1 tablet (100 mg total) by mouth 2 (two) times daily. 180 tablet 3  . insulin NPH-regular Human (70-30) 100 UNIT/ML injection Inject 25 Units into the skin 2 (two) times daily with a meal. 10 mL 11  . metoprolol tartrate (LOPRESSOR) 100 MG tablet Take 1 tablet (100 mg total) by mouth 2 (two) times daily. [place]  tablet 3  . Multiple Vitamin (MULTIVITAMIN ADULT) TABS Take 1 tablet by mouth daily.    . prazosin (MINIPRESS) 2 MG capsule Take 1 capsule (2 mg total) by mouth daily after supper. 90 capsule 1   No current facility-administered  medications on file prior to visit.    PAST MEDICAL HISTORY: Past Medical History:  Diagnosis Date  . Alcohol abuse   . Anemia   . Arthritis    "patient does not think so."  . Atrial fibrillation ([place])   . Cardiac syncope 10/07/14   rapid A fib with 8 sec pauses on converison with syncope- temp pacing wire placed then PPM  . Cataract    BILATERAL  . Chronic kidney disease   . Coronary artery disease   . Diabetes mellitus    dx---been  awhile  . Encounter for care of pacemaker 10/08/2014  . History of blood transfusion   . Hyperlipemia   . Hypertension   . Osteomyelitis ([place])    right foot  . Peripheral vascular disease ([place])   . Presence of permanent cardiac pacemaker 10/08/2014  . Sinus node dysfunction ([place]) 10/08/2014    PAST SURGICAL HISTORY: Past Surgical History:  Procedure Laterality Date  . ABDOMINAL AORTOGRAM W/LOWER EXTREMITY N/A 09/09/2017   Procedure: ABDOMINAL AORTOGRAM W/LOWER EXTREMITY;  Surgeon: Angelia Mould, MD;  Location: [place] [facility];  Service: Cardiovascular;  Laterality: N/A;  . ABDOMINAL AORTOGRAM W/LOWER EXTREMITY Right 11/11/2019   Procedure: ABDOMINAL AORTOGRAM W/LOWER EXTREMITY;  Surgeon: Alexander Heck, MD;  Location: [place] [facility];  Service: Cardiovascular;  Laterality: Right;  . AMPUTATION Left 08/19/2013   Procedure: AMPUTATION RAY;  Surgeon: Alta Corning, MD;  Location: [place];  Service: Orthopedics;  Laterality: Left;  ray amputation left 5th  . AMPUTATION Left 10/27/2013   Procedure: AMPUTATION DIGIT-LEFT 4TH TOE, 4th and 5th metatarsal.;  Surgeon: Angelia Mould, MD;  Location: [place];  Service: Vascular;  Laterality: Left;  . AMPUTATION Left 11/04/2013   Procedure: LEFT FOOT TRANS-METATARSAL AMPUTATION WITH WOUND CLOSURE ;  Surgeon: Alta Corning, MD;  Location: [place];  Service: Orthopedics;  Laterality: Left;  . AMPUTATION Right 09/10/2017   Procedure: RIGHT FOURTH AND FIFTH TOE AMPUTATION;  Surgeon:  Angelia Mould, MD;  Location: [place];  Service: Vascular;  Laterality: Right;  . AMPUTATION Right 02/19/2020   Procedure: AMPUTATION RIGHT 3RD TOE;  Surgeon: Angelia Mould, MD;  Location: [place];  Service: Vascular;  Laterality: Right;  . AMPUTATION Right 03/11/2020   Procedure: RIGHT BELOW KNEE AMPUTATION;  Surgeon: Newt Minion, MD;  Location: [place];  Service: Orthopedics;  Laterality: Right;  . APPLICATION OF WOUND VAC Right 02/19/2020   Procedure: Application Of Wound Vac Right foot;  Surgeon: Angelia Mould, MD;  Location: [place];  Service: Vascular;  Laterality: Right;  . BIOPSY  07/20/2020   Procedure: BIOPSY;  Surgeon: Rush Landmark Telford Nab., MD;  Location: [place];  Service: Gastroenterology;;  . CARDIAC CATHETERIZATION  10/07/2014   Procedure: LEFT HEART CATH AND CORONARY ANGIOGRAPHY;  Surgeon: Laverda Page, MD;  Location: [facility] CATH LAB;  Service: Cardiovascular;;  . COLONOSCOPY W/ POLYPECTOMY    . COLONOSCOPY WITH PROPOFOL N/A 05/25/2020   Procedure: COLONOSCOPY WITH PROPOFOL;  Surgeon: Gatha Mayer, MD;  Location: [facility];  Service: Endoscopy;  Laterality: N/A;  . EMBOLECTOMY Right 09/12/2017   Procedure: Thrombectomy  & Redo Right Below Knee Popliteal Artery Bypass Graft  ;  Surgeon: Donzetta Matters,  Georgia Dom, MD;  Location: [place];  Service: Vascular;  Laterality: Right;  . ENTEROSCOPY N/A 07/20/2020   Procedure: ENTEROSCOPY;  Surgeon: Mansouraty, Telford Nab., MD;  Location: [place];  Service: Gastroenterology;  Laterality: N/A;  . ESOPHAGOGASTRODUODENOSCOPY (EGD) WITH PROPOFOL N/A 05/24/2020   Procedure: ESOPHAGOGASTRODUODENOSCOPY (EGD) WITH PROPOFOL;  Surgeon: Ladene Artist, MD;  Location: [facility];  Service: Endoscopy;  Laterality: N/A;  . EYE SURGERY Bilateral    cataract  . FEMORAL-TIBIAL BYPASS GRAFT Left 10/27/2013   Procedure: BYPASS GRAFT LEFT FEMORAL- POSTERIOR TIBIAL ARTERY;  Surgeon: Angelia Mould, MD;  Location: [place];   Service: Vascular;  Laterality: Left;  . FEMORAL-TIBIAL BYPASS GRAFT Right 09/10/2017   Procedure: RIGHT SUPERFICIAL  FEMORAL ARTERY-BELOW KNEE POPLITEAL ARTERY BYPASS GRAFT WITH VEIN;  Surgeon: Angelia Mould, MD;  Location: [place];  Service: Vascular;  Laterality: Right;  . GIVENS CAPSULE STUDY N/A 07/20/2020   Procedure: GIVENS CAPSULE STUDY;  Surgeon: Irving Copas., MD;  Location: [place];  Service: Gastroenterology;  Laterality: N/A;  capsule placed through scope into duodenum @ 0936  . I & D EXTREMITY Left 10/27/2013   Procedure: IRRIGATION AND DEBRIDEMENT EXTREMITY- LEFT FOOT;  Surgeon: Angelia Mould, MD;  Location: [place];  Service: Vascular;  Laterality: Left;  . I & D EXTREMITY Right 01/26/2020   Procedure: IRRIGATION AND DEBRIDEMENT EXTREMITY RIGHT FOOT WOUND;  Surgeon: Angelia Mould, MD;  Location: [place];  Service: Vascular;  Laterality: Right;  . INTRAOPERATIVE ARTERIOGRAM Right 09/10/2017   Procedure: INTRA OPERATIVE ARTERIOGRAM;  Surgeon: Angelia Mould, MD;  Location: [place];  Service: Vascular;  Laterality: Right;  . IR ANGIOGRAM FOLLOW UP STUDY  09/09/2017  . IR FLUORO GUIDE CV LINE RIGHT  05/31/2020  . IR US GUIDE VASC ACCESS RIGHT  06/01/2020  . LEFT HEART CATH AND CORONARY ANGIOGRAPHY N/A 08/19/2020   Procedure: LEFT HEART CATH AND CORONARY ANGIOGRAPHY;  Surgeon: Alexander Prows, MD;  Location: [place] [facility];  Service: Cardiovascular;  Laterality: N/A;  . LEFT HEART CATHETERIZATION WITH CORONARY ANGIOGRAM N/A 11/09/2014   Procedure: LEFT HEART CATHETERIZATION WITH CORONARY ANGIOGRAM;  Surgeon: Laverda Page, MD;  Location: [facility] CATH LAB;  Service: Cardiovascular;  Laterality: N/A;  . LOWER EXTREMITY ANGIOGRAM Right 11/08/2015   Procedure: Lower Extremity Angiogram;  Surgeon: Serafina Mitchell, MD;  Location: [place] [facility];  Service: Cardiovascular;  Laterality: Right;  . LUMBAR LAMINECTOMY  06/13/2012   Procedure: MICRODISCECTOMY  LUMBAR LAMINECTOMY;  Surgeon: Jessy Oto, MD;  Location: [place];  Service: Orthopedics;  Laterality: N/A;  Central laminectomy L2-3, L3-4, L4-5  . PACEMAKER INSERTION  10/08/14   MDT Advisa MRI compatible dual chamber pacemaker implanted by Dr Caryl Comes for syncope with post-termination pauses  . PERCUTANEOUS CORONARY STENT INTERVENTION (PCI-S)  11/09/2014   des to lad & distal circumflex         Dr  [name]  . PERIPHERAL VASCULAR BALLOON ANGIOPLASTY Right 11/11/2019   Procedure: PERIPHERAL VASCULAR BALLOON ANGIOPLASTY;  Surgeon: Alexander Heck, MD;  Location: [place] [facility];  Service: Cardiovascular;  Laterality: Right;  below knee popliteal, tibioperoneal trunk, posterior tibial arteries  . PERIPHERAL VASCULAR CATHETERIZATION N/A 11/08/2015   Procedure: Abdominal Aortogram;  Surgeon: Serafina Mitchell, MD;  Location: [place] [facility];  Service: Cardiovascular;  Laterality: N/A;  . PERIPHERAL VASCULAR CATHETERIZATION Right 11/08/2015   Procedure: Peripheral Vascular Atherectomy;  Surgeon: Serafina Mitchell, MD;  Location: [place] [facility];  Service: Cardiovascular;  Laterality: Right;  . PERIPHERAL VASCULAR CATHETERIZATION N/A 10/10/2016   Procedure: Lower Extremity Angiography;  Surgeon: Waynetta Sandy, MD;  Location: [place] [facility];  Service: Cardiovascular;  Laterality: N/A;  . PERIPHERAL VASCULAR CATHETERIZATION Right 10/10/2016   Procedure: Peripheral Vascular Atherectomy;  Surgeon: Waynetta Sandy, MD;  Location: [place] [facility];  Service: Cardiovascular;  Laterality: Right;  Popliteal  . PERMANENT PACEMAKER INSERTION N/A 10/08/2014   Procedure: PERMANENT PACEMAKER INSERTION;  Surgeon: Deboraha Sprang, MD;  Location: [facility] CATH LAB;  Service: Cardiovascular;  Laterality: N/A;  . SUBMUCOSAL TATTOO INJECTION  07/20/2020   Procedure: SUBMUCOSAL TATTOO INJECTION;  Surgeon: Irving Copas., MD;  Location: [place];  Service: Gastroenterology;;  . TEMPORARY  PACEMAKER INSERTION Bilateral 10/07/2014   Procedure: TEMPORARY PACEMAKER INSERTION;  Surgeon: Laverda Page, MD;  Location: [facility] CATH LAB;  Service: Cardiovascular;  Laterality: Bilateral;  . WOUND DEBRIDEMENT Right 02/19/2020   Procedure: DEBRIDEMENT WOUND RIGHT FOOT;  Surgeon: Angelia Mould, MD;  Location: [facility] OR;  Service: Vascular;  Laterality: Right;    FAMILY HISTORY: The patient's family history includes Breast cancer in his sister; Diabetes in his brother, mother, and sister; Diabetes type II in his brother, mother, sister, and sister; Hypertension in his brother, mother, and sister; Kidney failure in his brother; Liver cancer in his father.   SOCIAL HISTORY:  The patient  reports that he quit smoking about 6 years ago. His smoking use included cigarettes. He has a 45.00 pack-year smoking history. He has never used smokeless tobacco. He reports current alcohol use of about 6.0 standard drinks of alcohol per week. He reports that he does not use drugs.  Review of Systems  Constitutional: Negative for chills and fever.  HENT: Negative for hoarse voice and nosebleeds.   Eyes: Negative for discharge, double vision and pain.  Cardiovascular: Negative for chest pain, claudication, dyspnea on exertion, leg swelling, near-syncope, orthopnea, palpitations, paroxysmal nocturnal dyspnea and syncope.  Respiratory: Negative for hemoptysis and shortness of breath.   Musculoskeletal: Negative for muscle cramps and myalgias.  Gastrointestinal: Negative for abdominal pain, constipation, diarrhea, hematemesis, hematochezia, melena, nausea and vomiting.  Neurological: Negative for dizziness and light-headedness.    PHYSICAL EXAM: Vitals with BMI 08/24/2020 08/19/2020 08/19/2020  Height '6\' 0"'  - -  Weight 270 lbs - -  BMI 74.12 - -  Systolic 878 676 720  Diastolic 68 66 73  Pulse 88 68 67   CONSTITUTIONAL: Appears older than stated age, well-developed and well-nourished. No acute distress.   SKIN: Skin is warm and dry. No rash noted. No cyanosis. No pallor. No jaundice HEAD: Normocephalic and atraumatic.  EYES: No scleral icterus MOUTH/THROAT: Moist oral membranes.  NECK: No JVD present. No thyromegaly noted. No carotid bruits  LYMPHATIC: No visible cervical adenopathy.  CHEST Normal respiratory effort. No intercostal retractions.  Pacemaker in the left infraclavicular region site is clean dry and intact. Dialysis catheter present anterior chest wall.  LUNGS: Clear to auscultation bilaterally.  No stridor. No wheezes. No rales.  CARDIOVASCULAR: Regular, positive S1-S2, no murmurs rubs or gallops appreciated ABDOMINAL: Soft, nontender, nondistended, positive bowel sounds in all 4 quadrants, no apparent ascites.  EXTREMITIES: Right BKA, +1 pitting edema in the left lower extremity, difficult to palpate dorsalis pedis posterior tibial pulses on the left side, prior surgical site well-healed. HEMATOLOGIC: No significant bruising NEUROLOGIC: Oriented to person, place, and time. Nonfocal. Normal muscle tone.  PSYCHIATRIC: Normal mood and affect. Normal behavior. Cooperative  CARDIAC DATABASE: Pacemaker in situ: S/P Medtronic advica DR (MRI compatible) dual-chamber pacemaker by Dr. Caryl Comes on 10/07/2014  Unscheduled (Alert) 06/15/20: The patient experienced a recurrence of atrial fibrillation, persisting since 06/03/20 (04:54 am). There were several PAF episodes on 7/20, 7/22 and 06/03/20 prior to the persistent episode, lasting up to 4 hours and 12 minutes in duration. There were 0 high ventricular rate episodes detected. Health trends do not demonstrate significant abnormality. Battery longevity is 4 years. RA pacing is 44.5 %, RV pacing is 8.6 %. Patient on Eliquis.   Scheduled Remote pacemaker check 09/14/2020:  There were 66 high atrial episodes, longest 9 hours and 8 minutes with paroxysmal atrial fibrillation/atrial flutter. 47.8 cumulative atrial arrhythmia burden. One 8 beat  NSVT. Health trends do not demonstrate significant abnormality. Battery longevity is 4.5 years. RA pacing is 28.2 %, RV pacing is 10.1    EKG: ***  04/22/2020: Normal sinus rhythm, 72 bpm, left axis deviation, left anterior fascicular block, right bundle branch block, poor R wave progression.  No significant change compared to prior ECG dated 10/23/2019.  07/13/2020: Atrial fibrillation, 84 bpm, left axis deviation, right bundle branch block, left anterior fascicular block. Since last ECG sinus rhythm is transition to A. fib.  Echocardiogram: 03/15/2016: Left ventricle cavity is normal in size. Severe concentric hypertrophy of the left ventricle. Normal global wall motion. Normal diastolic filling pattern. Calculated EF 66%. Left atrial cavity is mildly dilated. Right ventricle cavity is normal in size. Normal right ventricular function. Pacemaker lead/ICD lead noted in the RV. Trace mitral regurgitation. Trace pulmonic regurgitation.  05/24/2020: LVEF 60-65%, no regional wall motion of normalities, severe left ventricular hypertrophy, grade 2 diastolic impairment, mildly elevated PASP, moderately dilated biatrial cavities, mild MR.   Stress Testing:  Lexiscan myoview stress test 01/27/2016: 1. The resting electrocardiogram demonstrated normal sinus rhythm, normal resting conduction, no resting arrhythmias and normal rest repolarization. Stress EKG is non-diagnostic for ischemia as it a pharmacologic stress using Lexiscan. Stress symptoms included dyspnea. 2. The perfusion imaging study demonstrates a moderate-sized defect in the inferior wall extending from the base towards the apex, the defect slightly improved in stress images, suggests soft tissue attenuation. However scar in this region could not excluded. Left ventricular systolic function Calculated by QGS was 72%. This is a low risk study. Clinical correlation is recommended.  Heart Catheterization: Coronary angiogram 11/09/2014: CSI  diamont back atherectomy to Mid LAD and Distal dominant Cx and stenting 3.0x11m and 2.5x166mPromus DES. RCA long stenosis and small vessel (angio 10/07/14) left alone (medical management only). LVEF 60%.  Left Heart Catheterization 08/19/20:  Prior coronary angiogram: 11/09/2014: Diamondback CSI coronary atherectomy and stenting of the mid LAD-3.0 x 28 mm Promus Premier DES following. Distal dominant circumflex coronary artery following diamondback atherectomy and implantation of a 2.5 x 16 mm Promus Premier DES. Previously placed stents in LAD and CX patent. Mild to moderate diffuse disease in LAD, D1 and D2 and distal dominant Cx. RCA non dominant and severely diseased and small.   LABORATORY DATA: CBC Latest Ref Rng & Units 08/19/2020 07/23/2020 07/22/2020  WBC 4.0 - 10.5 K/uL 6.4 4.5 -  Hemoglobin 13.0 - 17.0 g/dL 10.4(L) 9.0(L) -  Hematocrit 39.0 - 52.0 % 31.8(L) 27.9(L) -  Platelets 150 - 400 K/uL 129(L) 135(L) 126(L)    CMP Latest Ref Rng & Units 08/19/2020 07/21/2020 07/20/2020  Glucose 70 - 99 mg/dL 142(H) 140(H) 155(H)  BUN 8 - 23 mg/dL '16 23 19  ' Creatinine 0.61 -  1.24 mg/dL 2.89(H) 5.06(H) 4.05(H)  Sodium 135 - 145 mmol/L 139 134(L) 138  Potassium 3.5 - 5.1 mmol/L 4.1 4.2 4.3  Chloride 98 - 111 mmol/L 100 99 103  CO2 22 - 32 mmol/L '30 27 24  ' Calcium 8.9 - 10.3 mg/dL 8.9 8.4(L) 8.3(L)  Total Protein 6.5 - 8.1 g/dL 5.6(L) - -  Total Bilirubin 0.3 - 1.2 mg/dL 1.2 - -  Alkaline Phos 38 - 126 U/L 83 - -  AST 15 - 41 U/L 22 - -  ALT 0 - 44 U/L 21 - -    Lipid Panel     Component Value Date/Time   CHOL 147 11/20/2013 1422   TRIG 160 (H) 11/20/2013 1422   HDL 30 (L) 11/20/2013 1422   CHOLHDL 4.9 11/20/2013 1422   VLDL 32 11/20/2013 1422   LDLCALC 85 11/20/2013 1422    Lab Results  Component Value Date   HGBA1C 5.5 05/23/2020   HGBA1C 8.6 (H) 01/27/2020   HGBA1C 6.5 (H) 09/10/2017   No components found for: NTPROBNP Lab Results  Component Value Date   TSH 1.480  10/08/2014   TSH 4.760 (H) 05/01/2014   TSH 2.048 10/21/2013    Cardiac Panel (last 3 results) No results for input(s): CKTOTAL, CKMB, TROPONINIHS, RELINDX in the last 72 hours.  External Labs:  Labs 10/06/2019: A1c 6.5%. Serum glucose 136 mg, BUN 55, creatinine 2.6, eGFR 25/30 ML. Potassium 5.4. CMP otherwise normal. HB 10.2/HCT 31.4, platelets 141. Normal indicis. Total cholesterol 95, triglycerides 98, HDL 34, LDL 41. Non-HDL cholesterol 61. PSA normal.  IMPRESSION:    ICD-10-CM   1. Paroxysmal atrial fibrillation (HCC)  I48.0   2. Atherosclerosis of native coronary artery of native heart without angina pectoris  I25.10   3. Essential hypertension, benign  I10   4. Presence of permanent cardiac pacemaker  Z95.0   5. Long term (current) use of anticoagulants  Z79.01     This patients CHA2DS2-VASc Score 3 (HTN, DM, vasc) and yearly risk of stroke 3.2%.   RECOMMENDATIONS: Alexander [name] is a 60 y.o. male whose past medical history and cardiovascular risk factors include: Paroxysmal atrial fibrillation, on oral anticoagulation, coronary artery disease with prior intervention, peripheral artery disease with prior bypass, status post right BKA, pacemaker implantation secondary to complete heart block (implanted 10/07/2014), former smoker, insulin-dependent diabetes mellitus, increased alcohol use, obesity due to excess calories.  ***1. Atherosclerosis of native coronary artery of native heart without angina pectoris Left heart catheterization and coronary angiography revealed previously placed stents to LCx and LAD, mild to moderate residual diffuse disease in LAD and LCx, severe RCA disease.  We will continue guideline directed medical therapy: Atorvastatin, metoprolol titrate.  Due to continued anemia and current high bleeding risk patient is not on aspirin due to increased bleeding risk when used with Eliquis.  We will continue to monitor hemoglobin/hematocrit. In view of CKD on  dialysis he is also not on an ACE inhibitor or an ARB.  He is presently doing well without clinical signs of heart failure.  ***2. Paroxysmal atrial fibrillation (HCC) Currently well rate controlled, will continue metoprolol tartrate 100 mg twice daily.  He has not had bleeding diathesis on Eliquis.  CHA2DS2-VASc score of 3 with 3.2% annual risk of stroke.  We will continue Eliquis 5 mg twice daily.  Notably patient's creatinine is 2.8 (>1.5), however he is only 60 years old and weighs >60 kg, therefore will not reduce Eliquis dose at this time. Will discuss  with patient's nephrologist.  Eliquis 5 mg twice daily  ***Normal pacemaker function. His AF burden is approaching 50%. If no symptoms, continue rate control strategy, otherwise need to consider antiarrhythmic drugs (AAD)  ***3. Essential hypertension, benign Patient's blood pressure in office today was initially elevated, upon rechecking came down to 138/68.  This is still above goal however patient reports his blood pressures at the dialysis center are well controlled following dialysis.  He has dialysis Tuesday, Thursday, Saturday.  Discussed with patient the importance of monitoring his blood pressure regularly, he verbalized understanding.  We will continue current antihypertensive medications including metoprolol, amlodipine, hydralazine.  ***4. Long term (current) use of anticoagulants Patient denies bleeding diathesis on Eliquis.  Discussed the risks and benefits of oral anticoagulation patient verbalized understanding.  Will continue Eliquis 5 mg twice daily.  Educated on importance of improving his modifiable cardiovascular risk factors which include but not limited to: Decreasing alcohol consumption to no more than 2 drinks per day, blood pressure management, lipid management, and glycemic control.  Patient verbalizes understanding.  FINAL MEDICATION LIST END OF ENCOUNTER:   Current Outpatient Medications:  .  ACCU-CHEK FASTCLIX  LANCETS MISC, Check blood sugar TID & QHS, Disp: 102 each, Rfl: 2 .  amLODipine (NORVASC) 5 MG tablet, Take 1 tablet (5 mg total) by mouth daily., Disp: 90 tablet, Rfl: 3 .  apixaban (ELIQUIS) 5 MG TABS tablet, Take 1 tablet (5 mg total) by mouth 2 (two) times daily., Disp: 180 tablet, Rfl: 0 .  atorvastatin (LIPITOR) 20 MG tablet, Take 1 tablet by mouth once daily, Disp: 90 tablet, Rfl: 0 .  blood glucose meter kit and supplies KIT, Check blood sugar TID & QHS, Disp: 1 each, Rfl: 0 .  Blood Glucose Monitoring Suppl (ACCU-CHEK ADVANTAGE DIABETES) kit, Use as instructed, Disp: 1 each, Rfl: 0 .  glucose blood (ACCU-CHEK AVIVA PLUS) test strip, Use as instructed, Disp: 100 each, Rfl: 12 .  glucose blood (CHOICE DM FORA G20 TEST STRIPS) test strip, Check blood sugar TID & QHS, Disp: 100 each, Rfl: 12 .  glucose blood test strip, Use as instructed, Disp: 100 each, Rfl: 12 .  hydrALAZINE (APRESOLINE) 100 MG tablet, Take 1 tablet (100 mg total) by mouth 2 (two) times daily., Disp: 180 tablet, Rfl: 3 .  insulin NPH-regular Human (70-30) 100 UNIT/ML injection, Inject 25 Units into the skin 2 (two) times daily with a meal., Disp: 10 mL, Rfl: 11 .  metoprolol tartrate (LOPRESSOR) 100 MG tablet, Take 1 tablet (100 mg total) by mouth 2 (two) times daily., Disp: 180 tablet, Rfl: 3 .  Multiple Vitamin (MULTIVITAMIN ADULT) TABS, Take 1 tablet by mouth daily., Disp: , Rfl:  .  prazosin (MINIPRESS) 2 MG capsule, Take 1 capsule (2 mg total) by mouth daily after supper., Disp: 90 capsule, Rfl: 1  No orders of the defined types were placed in this encounter.  --Continue cardiac medications as reconciled in final medication list. --No follow-ups on file. Or sooner if needed.*** --Continue follow-up with your primary care physician regarding the management of your other chronic comorbid conditions.  Patient's questions and concerns were addressed to his satisfaction. He voices understanding of the instructions  provided during this encounter.   This note was created using a voice recognition software as a result there may be grammatical errors inadvertently enclosed that do not reflect the nature of this encounter. Every attempt is made to correct such errors.  Patient was seen in collaboration with Dr. Virgina Jock discussed  plan and he is in agreement.  ***

## 2020-11-29 DIAGNOSIS — T8249XA Other complication of vascular dialysis catheter, initial encounter: Secondary | ICD-10-CM | POA: Diagnosis not present

## 2020-11-29 DIAGNOSIS — N186 End stage renal disease: Secondary | ICD-10-CM | POA: Diagnosis not present

## 2020-11-29 DIAGNOSIS — E0821 Diabetes mellitus due to underlying condition with diabetic nephropathy: Secondary | ICD-10-CM | POA: Diagnosis not present

## 2020-11-29 DIAGNOSIS — D509 Iron deficiency anemia, unspecified: Secondary | ICD-10-CM | POA: Diagnosis not present

## 2020-11-29 DIAGNOSIS — N2581 Secondary hyperparathyroidism of renal origin: Secondary | ICD-10-CM | POA: Diagnosis not present

## 2020-11-29 DIAGNOSIS — D631 Anemia in chronic kidney disease: Secondary | ICD-10-CM | POA: Diagnosis not present

## 2020-11-29 DIAGNOSIS — D689 Coagulation defect, unspecified: Secondary | ICD-10-CM | POA: Diagnosis not present

## 2020-11-29 DIAGNOSIS — Z992 Dependence on renal dialysis: Secondary | ICD-10-CM | POA: Diagnosis not present

## 2020-11-30 ENCOUNTER — Encounter (HOSPITAL_COMMUNITY): Payer: Medicare Other

## 2020-11-30 ENCOUNTER — Ambulatory Visit: Payer: Medicare Other | Admitting: Vascular Surgery

## 2020-11-30 ENCOUNTER — Ambulatory Visit: Payer: Medicare Other | Admitting: Student

## 2020-11-30 DIAGNOSIS — Z7901 Long term (current) use of anticoagulants: Secondary | ICD-10-CM

## 2020-11-30 DIAGNOSIS — I2510 Atherosclerotic heart disease of native coronary artery without angina pectoris: Secondary | ICD-10-CM

## 2020-11-30 DIAGNOSIS — Z950 Presence of cardiac pacemaker: Secondary | ICD-10-CM

## 2020-11-30 DIAGNOSIS — I480 Paroxysmal atrial fibrillation: Secondary | ICD-10-CM

## 2020-11-30 DIAGNOSIS — I10 Essential (primary) hypertension: Secondary | ICD-10-CM

## 2020-12-01 DIAGNOSIS — T8249XA Other complication of vascular dialysis catheter, initial encounter: Secondary | ICD-10-CM | POA: Diagnosis not present

## 2020-12-01 DIAGNOSIS — D509 Iron deficiency anemia, unspecified: Secondary | ICD-10-CM | POA: Diagnosis not present

## 2020-12-01 DIAGNOSIS — N2581 Secondary hyperparathyroidism of renal origin: Secondary | ICD-10-CM | POA: Diagnosis not present

## 2020-12-01 DIAGNOSIS — E0821 Diabetes mellitus due to underlying condition with diabetic nephropathy: Secondary | ICD-10-CM | POA: Diagnosis not present

## 2020-12-01 DIAGNOSIS — D689 Coagulation defect, unspecified: Secondary | ICD-10-CM | POA: Diagnosis not present

## 2020-12-01 DIAGNOSIS — N186 End stage renal disease: Secondary | ICD-10-CM | POA: Diagnosis not present

## 2020-12-01 DIAGNOSIS — Z992 Dependence on renal dialysis: Secondary | ICD-10-CM | POA: Diagnosis not present

## 2020-12-01 DIAGNOSIS — D631 Anemia in chronic kidney disease: Secondary | ICD-10-CM | POA: Diagnosis not present

## 2020-12-03 DIAGNOSIS — T8249XA Other complication of vascular dialysis catheter, initial encounter: Secondary | ICD-10-CM | POA: Diagnosis not present

## 2020-12-03 DIAGNOSIS — D509 Iron deficiency anemia, unspecified: Secondary | ICD-10-CM | POA: Diagnosis not present

## 2020-12-03 DIAGNOSIS — N186 End stage renal disease: Secondary | ICD-10-CM | POA: Diagnosis not present

## 2020-12-03 DIAGNOSIS — Z992 Dependence on renal dialysis: Secondary | ICD-10-CM | POA: Diagnosis not present

## 2020-12-03 DIAGNOSIS — E0821 Diabetes mellitus due to underlying condition with diabetic nephropathy: Secondary | ICD-10-CM | POA: Diagnosis not present

## 2020-12-03 DIAGNOSIS — N2581 Secondary hyperparathyroidism of renal origin: Secondary | ICD-10-CM | POA: Diagnosis not present

## 2020-12-03 DIAGNOSIS — D631 Anemia in chronic kidney disease: Secondary | ICD-10-CM | POA: Diagnosis not present

## 2020-12-03 DIAGNOSIS — D689 Coagulation defect, unspecified: Secondary | ICD-10-CM | POA: Diagnosis not present

## 2020-12-06 DIAGNOSIS — D631 Anemia in chronic kidney disease: Secondary | ICD-10-CM | POA: Diagnosis not present

## 2020-12-06 DIAGNOSIS — N2581 Secondary hyperparathyroidism of renal origin: Secondary | ICD-10-CM | POA: Diagnosis not present

## 2020-12-06 DIAGNOSIS — D689 Coagulation defect, unspecified: Secondary | ICD-10-CM | POA: Diagnosis not present

## 2020-12-06 DIAGNOSIS — D509 Iron deficiency anemia, unspecified: Secondary | ICD-10-CM | POA: Diagnosis not present

## 2020-12-06 DIAGNOSIS — E0821 Diabetes mellitus due to underlying condition with diabetic nephropathy: Secondary | ICD-10-CM | POA: Diagnosis not present

## 2020-12-06 DIAGNOSIS — T8249XA Other complication of vascular dialysis catheter, initial encounter: Secondary | ICD-10-CM | POA: Diagnosis not present

## 2020-12-06 DIAGNOSIS — Z992 Dependence on renal dialysis: Secondary | ICD-10-CM | POA: Diagnosis not present

## 2020-12-06 DIAGNOSIS — N186 End stage renal disease: Secondary | ICD-10-CM | POA: Diagnosis not present

## 2020-12-06 NOTE — Progress Notes (Signed)
Alexander [name] Date of Birth: 1961/05/20 MRN: [id number] Primary Care Provider:Tisovec, [name], MD Former Cardiology Providers: Dr. Adrian Prows, Jeri Lager, APRN, FNP-C, Dr. Terri Skains Primary Cardiologist: Alethia Berthold, PA-C, (established care 11/25/2019) Electrophysiologist: Dr. Cristopher Peru  Date: 12/07/2020 Last Office Visit: 08/24/2020  Chief Complaint  Patient presents with  . Atherosclerosis of native coronary artery of native heart w  . Atrial Fibrillation  . Follow-up    HPI  Alexander [name] is a 60 y.o.  male who presents to the office for follow up of atrial fibrillation and coronary artery disease. Patient has a complex past medical history and multiple cardiovascular risk factors which include but not limited to: Paroxysmal atrial fibrillation, on oral anticoagulation, coronary artery disease ( angioplasty and stenting to the LAD and dominant circumflex in December 2015, diffusely diseased small RCA), peripheral artery disease with prior bypass, status post right BKA, pacemaker implantation secondary to complete heart block (implanted 10/07/2014), former smoker, insulin-dependent diabetes mellitus, alcohol use, hemodialysis, obesity due to excess calories.  Nonsustained ventricular tachycardia was noted on pacemaker which triggered him to undergo stress test and echocardiogram.  Stress test was abnormal, noting reversible ischemia and LVEF per gated SPECT was reduced.  Patient underwent left heart catheterization 08/19/2020, which revealed previously placed stents to LCx and LAD, mild to moderate residual diffuse disease in LAD and LCx, severe RCA disease. Recommendation from cath was continued medical management.    Patient presents for 3 month follow up of atrial fibrillation and coronary artery disease. Patient reports he has had episodes of hypotension following dialysis, therefore nephrology reduced metoprolol tartrate from 100 mg to 50 mg twice daily. Nephology also  monitors patient's hemoglobin closely and administers Mircera every 2 weeks. Patient is presently doing well without specific complaints. Denies chest pain, swelling, palpitations, orthopnea, PND. Denies melena, blood in his urine or stool.   ALLERGIES: Allergies  Allergen Reactions  . Cytoxan [Cyclophosphamide]     pancytopenia  . Keflex [Cephalexin] Nausea And Vomiting  . Pletal [Cilostazol] Palpitations    "Can hear heart beating loudly".   MEDICATION LIST PRIOR TO VISIT: Current Outpatient Medications on File Prior to Visit  Medication Sig Dispense Refill  . ACCU-CHEK FASTCLIX LANCETS MISC Check blood sugar TID & QHS 102 each 2  . amLODipine (NORVASC) 5 MG tablet Take 1 tablet (5 mg total) by mouth daily. 90 tablet 3  . apixaban (ELIQUIS) 5 MG TABS tablet Take 1 tablet (5 mg total) by mouth 2 (two) times daily. 180 tablet 0  . atorvastatin (LIPITOR) 20 MG tablet Take 1 tablet by mouth once daily 90 tablet 0  . blood glucose meter kit and supplies KIT Check blood sugar TID & QHS 1 each 0  . Blood Glucose Monitoring Suppl (ACCU-CHEK ADVANTAGE DIABETES) kit Use as instructed 1 each 0  . glucose blood (ACCU-CHEK AVIVA PLUS) test strip Use as instructed 100 each 12  . glucose blood (CHOICE DM FORA G20 TEST STRIPS) test strip Check blood sugar TID & QHS 100 each 12  . glucose blood test strip Use as instructed 100 each 12  . hydrALAZINE (APRESOLINE) 100 MG tablet Take 1 tablet (100 mg total) by mouth 2 (two) times daily. 180 tablet 3  . insulin NPH-regular Human (70-30) 100 UNIT/ML injection Inject 25 Units into the skin 2 (two) times daily with a meal. 10 mL 11  . metoprolol tartrate (LOPRESSOR) 100 MG tablet Take 1 tablet (100 mg total) by mouth 2 (two) times  daily. (Patient taking differently: Take 50 mg by mouth 2 (two) times daily.) 180 tablet 3  . Multiple Vitamin (MULTIVITAMIN ADULT) TABS Take 1 tablet by mouth daily.    . prazosin (MINIPRESS) 2 MG capsule Take 1 capsule (2 mg total)  by mouth daily after supper. 90 capsule 1   No current facility-administered medications on file prior to visit.    PAST MEDICAL HISTORY: Past Medical History:  Diagnosis Date  . Alcohol abuse   . Anemia   . Arthritis    "patient does not think so."  . Atrial fibrillation ([place])   . Cardiac syncope 10/07/14   rapid A fib with 8 sec pauses on converison with syncope- temp pacing wire placed then PPM  . Cataract    BILATERAL  . Chronic kidney disease   . Coronary artery disease   . Diabetes mellitus    dx---been  awhile  . Encounter for care of pacemaker 10/08/2014  . History of blood transfusion   . Hyperlipemia   . Hypertension   . Osteomyelitis ([place])    right foot  . Peripheral vascular disease ([place])   . Presence of permanent cardiac pacemaker 10/08/2014  . Sinus node dysfunction ([place]) 10/08/2014    PAST SURGICAL HISTORY: Past Surgical History:  Procedure Laterality Date  . ABDOMINAL AORTOGRAM W/LOWER EXTREMITY N/A 09/09/2017   Procedure: ABDOMINAL AORTOGRAM W/LOWER EXTREMITY;  Surgeon: Angelia Mould, MD;  Location: [place] [facility];  Service: Cardiovascular;  Laterality: N/A;  . ABDOMINAL AORTOGRAM W/LOWER EXTREMITY Right 11/11/2019   Procedure: ABDOMINAL AORTOGRAM W/LOWER EXTREMITY;  Surgeon: Marty Heck, MD;  Location: [place] [facility];  Service: Cardiovascular;  Laterality: Right;  . AMPUTATION Left 08/19/2013   Procedure: AMPUTATION RAY;  Surgeon: Alta Corning, MD;  Location: [place];  Service: Orthopedics;  Laterality: Left;  ray amputation left 5th  . AMPUTATION Left 10/27/2013   Procedure: AMPUTATION DIGIT-LEFT 4TH TOE, 4th and 5th metatarsal.;  Surgeon: Angelia Mould, MD;  Location: [place];  Service: Vascular;  Laterality: Left;  . AMPUTATION Left 11/04/2013   Procedure: LEFT FOOT TRANS-METATARSAL AMPUTATION WITH WOUND CLOSURE ;  Surgeon: Alta Corning, MD;  Location: [place];  Service: Orthopedics;  Laterality: Left;  . AMPUTATION Right  09/10/2017   Procedure: RIGHT FOURTH AND FIFTH TOE AMPUTATION;  Surgeon: Angelia Mould, MD;  Location: [place];  Service: Vascular;  Laterality: Right;  . AMPUTATION Right 02/19/2020   Procedure: AMPUTATION RIGHT 3RD TOE;  Surgeon: Angelia Mould, MD;  Location: [place];  Service: Vascular;  Laterality: Right;  . AMPUTATION Right 03/11/2020   Procedure: RIGHT BELOW KNEE AMPUTATION;  Surgeon: Newt Minion, MD;  Location: [place];  Service: Orthopedics;  Laterality: Right;  . APPLICATION OF WOUND VAC Right 02/19/2020   Procedure: Application Of Wound Vac Right foot;  Surgeon: Angelia Mould, MD;  Location: [place];  Service: Vascular;  Laterality: Right;  . BIOPSY  07/20/2020   Procedure: BIOPSY;  Surgeon: Rush Landmark Telford Nab., MD;  Location: [place];  Service: Gastroenterology;;  . CARDIAC CATHETERIZATION  10/07/2014   Procedure: LEFT HEART CATH AND CORONARY ANGIOGRAPHY;  Surgeon: Laverda Page, MD;  Location: [facility] C[facility];  Service: Cardiovascular;;  . COLONOSCOPY W/ POLYPECTOMY    . COLONOSCOPY WITH PROPOFOL N/A 05/25/2020   Procedure: COLONOSCOPY WITH PROPOFOL;  Surgeon: Gatha Mayer, MD;  Location: [facility];  Service: Endoscopy;  Laterality: N/A;  . EMBOLECTOMY Right 09/12/2017   Procedure: Thrombectomy  &  Redo Right Below Knee Popliteal Artery Bypass Graft  ;  Surgeon: Waynetta Sandy, MD;  Location: [place];  Service: Vascular;  Laterality: Right;  . ENTEROSCOPY N/A 07/20/2020   Procedure: ENTEROSCOPY;  Surgeon: Mansouraty, Telford Nab., MD;  Location: [place];  Service: Gastroenterology;  Laterality: N/A;  . ESOPHAGOGASTRODUODENOSCOPY (EGD) WITH PROPOFOL N/A 05/24/2020   Procedure: ESOPHAGOGASTRODUODENOSCOPY (EGD) WITH PROPOFOL;  Surgeon: Ladene Artist, MD;  Location: [facility];  Service: Endoscopy;  Laterality: N/A;  . EYE SURGERY Bilateral    cataract  . FEMORAL-TIBIAL BYPASS GRAFT Left 10/27/2013   Procedure: BYPASS GRAFT LEFT FEMORAL-  POSTERIOR TIBIAL ARTERY;  Surgeon: Angelia Mould, MD;  Location: [place];  Service: Vascular;  Laterality: Left;  . FEMORAL-TIBIAL BYPASS GRAFT Right 09/10/2017   Procedure: RIGHT SUPERFICIAL  FEMORAL ARTERY-BELOW KNEE POPLITEAL ARTERY BYPASS GRAFT WITH VEIN;  Surgeon: Angelia Mould, MD;  Location: [place];  Service: Vascular;  Laterality: Right;  . GIVENS CAPSULE STUDY N/A 07/20/2020   Procedure: GIVENS CAPSULE STUDY;  Surgeon: Irving Copas., MD;  Location: [place];  Service: Gastroenterology;  Laterality: N/A;  capsule placed through scope into duodenum @ 0936  . I & D EXTREMITY Left 10/27/2013   Procedure: IRRIGATION AND DEBRIDEMENT EXTREMITY- LEFT FOOT;  Surgeon: Angelia Mould, MD;  Location: [place];  Service: Vascular;  Laterality: Left;  . I & D EXTREMITY Right 01/26/2020   Procedure: IRRIGATION AND DEBRIDEMENT EXTREMITY RIGHT FOOT WOUND;  Surgeon: Angelia Mould, MD;  Location: [place];  Service: Vascular;  Laterality: Right;  . INTRAOPERATIVE ARTERIOGRAM Right 09/10/2017   Procedure: INTRA OPERATIVE ARTERIOGRAM;  Surgeon: Angelia Mould, MD;  Location: [place];  Service: Vascular;  Laterality: Right;  . IR ANGIOGRAM FOLLOW UP STUDY  09/09/2017  . IR FLUORO GUIDE CV LINE RIGHT  05/31/2020  . IR US GUIDE VASC ACCESS RIGHT  06/01/2020  . LEFT HEART CATH AND CORONARY ANGIOGRAPHY N/A 08/19/2020   Procedure: LEFT HEART CATH AND CORONARY ANGIOGRAPHY;  Surgeon: Adrian Prows, MD;  Location: [place] [facility];  Service: Cardiovascular;  Laterality: N/A;  . LEFT HEART CATHETERIZATION WITH CORONARY ANGIOGRAM N/A 11/09/2014   Procedure: LEFT HEART CATHETERIZATION WITH CORONARY ANGIOGRAM;  Surgeon: Laverda Page, MD;  Location: [facility] CATH LAB;  Service: Cardiovascular;  Laterality: N/A;  . LOWER EXTREMITY ANGIOGRAM Right 11/08/2015   Procedure: Lower Extremity Angiogram;  Surgeon: Serafina Mitchell, MD;  Location: [place] [facility];  Service: Cardiovascular;   Laterality: Right;  . LUMBAR LAMINECTOMY  06/13/2012   Procedure: MICRODISCECTOMY LUMBAR LAMINECTOMY;  Surgeon: Jessy Oto, MD;  Location: [place];  Service: Orthopedics;  Laterality: N/A;  Central laminectomy L2-3, L3-4, L4-5  . PACEMAKER INSERTION  10/08/14   MDT Advisa MRI compatible dual chamber pacemaker implanted by Dr Caryl Comes for syncope with post-termination pauses  . PERCUTANEOUS CORONARY STENT INTERVENTION (PCI-S)  11/09/2014   des to lad & distal circumflex         Dr  [name]  . PERIPHERAL VASCULAR BALLOON ANGIOPLASTY Right 11/11/2019   Procedure: PERIPHERAL VASCULAR BALLOON ANGIOPLASTY;  Surgeon: Marty Heck, MD;  Location: [place] [facility];  Service: Cardiovascular;  Laterality: Right;  below knee popliteal, tibioperoneal trunk, posterior tibial arteries  . PERIPHERAL VASCULAR CATHETERIZATION N/A 11/08/2015   Procedure: Abdominal Aortogram;  Surgeon: Serafina Mitchell, MD;  Location: [place] [facility];  Service: Cardiovascular;  Laterality: N/A;  . PERIPHERAL VASCULAR CATHETERIZATION Right 11/08/2015   Procedure: Peripheral Vascular Atherectomy;  Surgeon: Serafina Mitchell, MD;  Location: [place] [facility];  Service: Cardiovascular;  Laterality: Right;  . PERIPHERAL VASCULAR CATHETERIZATION N/A 10/10/2016   Procedure: Lower Extremity Angiography;  Surgeon: Waynetta Sandy, MD;  Location: [place] [facility];  Service: Cardiovascular;  Laterality: N/A;  . PERIPHERAL VASCULAR CATHETERIZATION Right 10/10/2016   Procedure: Peripheral Vascular Atherectomy;  Surgeon: Waynetta Sandy, MD;  Location: [place] [facility];  Service: Cardiovascular;  Laterality: Right;  Popliteal  . PERMANENT PACEMAKER INSERTION N/A 10/08/2014   Procedure: PERMANENT PACEMAKER INSERTION;  Surgeon: Deboraha Sprang, MD;  Location: [facility] CATH LAB;  Service: Cardiovascular;  Laterality: N/A;  . SUBMUCOSAL TATTOO INJECTION  07/20/2020   Procedure: SUBMUCOSAL TATTOO INJECTION;  Surgeon: Irving Copas., MD;  Location: [place];  Service: Gastroenterology;;  . TEMPORARY PACEMAKER INSERTION Bilateral 10/07/2014   Procedure: TEMPORARY PACEMAKER INSERTION;  Surgeon: Laverda Page, MD;  Location: [facility] CATH LAB;  Service: Cardiovascular;  Laterality: Bilateral;  . WOUND DEBRIDEMENT Right 02/19/2020   Procedure: DEBRIDEMENT WOUND RIGHT FOOT;  Surgeon: Angelia Mould, MD;  Location: [facility];  Service: Vascular;  Laterality: Right;    FAMILY HISTORY: The patient's family history includes Breast cancer in his sister; Diabetes in his brother, mother, and sister; Diabetes type II in his brother, mother, sister, and sister; Hypertension in his brother, mother, and sister; Kidney failure in his brother; Liver cancer in his father.   SOCIAL HISTORY:  The patient  reports that he quit smoking about 6 years ago. His smoking use included cigarettes. He has a 45.00 pack-year smoking history. He has never used smokeless tobacco. He reports current alcohol use of about 6.0 standard drinks of alcohol per week. He reports that he does not use drugs.  Review of Systems  Constitutional: Negative for chills, fever, malaise/fatigue and weight gain.  HENT: Negative for hoarse voice and nosebleeds.   Eyes: Negative for discharge, double vision and pain.  Cardiovascular: Negative for chest pain, claudication, dyspnea on exertion, leg swelling, near-syncope, orthopnea, palpitations, paroxysmal nocturnal dyspnea and syncope.  Respiratory: Negative for hemoptysis and shortness of breath.   Hematologic/Lymphatic: Does not bruise/bleed easily.  Musculoskeletal: Negative for muscle cramps and myalgias.  Gastrointestinal: Negative for abdominal pain, constipation, diarrhea, hematemesis, hematochezia, melena, nausea and vomiting.  Neurological: Negative for dizziness, light-headedness and weakness.    PHYSICAL EXAM: Vitals with BMI 12/07/2020 08/24/2020 08/19/2020  Height '6\' 0"'  '6\' 0"'  -  Weight 270 lbs  270 lbs -  BMI 91.79 15.05 -  Systolic 697 948 016  Diastolic 63 68 66  Pulse 78 88 68   Physical Exam Vitals reviewed.  Constitutional:      Comments: Appears older than stated age  HENT:     Head: Normocephalic and atraumatic.  Neck:     Vascular: No carotid bruit or JVD.  Cardiovascular:     Rate and Rhythm: Normal rate and regular rhythm.     Pulses:          Carotid pulses are 2+ on the right side and 2+ on the left side.      Radial pulses are 2+ on the right side and 2+ on the left side.     Heart sounds: S1 normal and S2 normal. No murmur heard. No gallop.      Comments: Difficult to evaluate left lower extremity distal pulses  Pulmonary:     Effort: Pulmonary effort is normal. No respiratory distress.     Breath sounds: No  wheezing, rhonchi or rales.  Chest:     Comments: Pacemaker in the left infraclavicular region site is clean dry and intact. Dialysis catheter present anterior chest wall.  Musculoskeletal:     Cervical back: Normal range of motion and neck supple.     Left lower leg: Edema (1+ pitting) present.     Comments: Right BKA  Skin:    General: Skin is warm and dry.  Neurological:     Mental Status: He is alert.     CARDIAC DATABASE: Pacemaker in situ: S/P Medtronic advica DR (MRI compatible) dual-chamber pacemaker by Dr. Caryl Comes on 10/07/2014  Unscheduled (Alert) 06/15/20: The patient experienced a recurrence of atrial fibrillation, persisting since 06/03/20 (04:54 am). There were several PAF episodes on 7/20, 7/22 and 06/03/20 prior to the persistent episode, lasting up to 4 hours and 12 minutes in duration. There were 0 high ventricular rate episodes detected. Health trends do not demonstrate significant abnormality. Battery longevity is 4 years. RA pacing is 44.5 %, RV pacing is 8.6 %. Patient on Eliquis.   Scheduled Remote pacemaker check 09/14/2020:  There were 66 high atrial episodes, longest 9 hours and 8 minutes with paroxysmal atrial  fibrillation/atrial flutter. 47.8 cumulative atrial arrhythmia burden. One 8 beat NSVT. Health trends do not demonstrate significant abnormality. Battery longevity is 4.5 years. RA pacing is 28.2 %, RV pacing is 10.1    EKG: 12/07/2018: Sinus rhythm at a rate of 70 bpm.  Left axis, left anterior fascicular block.  Complete right bundle branch block.  Compared to EKG 04/22/2020, poor R wave progression not present.  04/22/2020: Normal sinus rhythm, 72 bpm, left axis deviation, left anterior fascicular block, right bundle branch block, poor R wave progression.  No significant change compared to prior ECG dated 10/23/2019.  07/13/2020: Atrial fibrillation, 84 bpm, left axis deviation, right bundle branch block, left anterior fascicular block. Since last ECG sinus rhythm is transition to A. fib.  Echocardiogram: 03/15/2016: Left ventricle cavity is normal in size. Severe concentric hypertrophy of the left ventricle. Normal global wall motion. Normal diastolic filling pattern. Calculated EF 66%. Left atrial cavity is mildly dilated. Right ventricle cavity is normal in size. Normal right ventricular function. Pacemaker lead/ICD lead noted in the RV. Trace mitral regurgitation. Trace pulmonic regurgitation.  05/24/2020: LVEF 60-65%, no regional wall motion of normalities, severe left ventricular hypertrophy, grade 2 diastolic impairment, mildly elevated PASP, moderately dilated biatrial cavities, mild MR.   Stress Testing:  Lexiscan myoview stress test 01/27/2016: 1. The resting electrocardiogram demonstrated normal sinus rhythm, normal resting conduction, no resting arrhythmias and normal rest repolarization. Stress EKG is non-diagnostic for ischemia as it a pharmacologic stress using Lexiscan. Stress symptoms included dyspnea. 2. The perfusion imaging study demonstrates a moderate-sized defect in the inferior wall extending from the base towards the apex, the defect slightly improved in stress images,  suggests soft tissue attenuation. However scar in this region could not excluded. Left ventricular systolic function Calculated by QGS was 72%. This is a low risk study. Clinical correlation is recommended.  Heart Catheterization: Coronary angiogram 11/09/2014: CSI diamont back atherectomy to Mid LAD and Distal dominant Cx and stenting 3.0x68m and 2.5x166mPromus DES. RCA long stenosis and small vessel (angio 10/07/14) left alone (medical management only). LVEF 60%.  Left Heart Catheterization 08/19/20:  Prior coronary angiogram: 11/09/2014: Diamondback CSI coronary atherectomy and stenting of the mid LAD-3.0 x 28 mm Promus Premier DES following. Distal dominant circumflex coronary artery following diamondback atherectomy and implantation of a  2.5 x 16 mm Promus Premier DES. Previously placed stents in LAD and CX patent. Mild to moderate diffuse disease in LAD, D1 and D2 and distal dominant Cx. RCA non dominant and severely diseased and small.   LABORATORY DATA: CBC Latest Ref Rng & Units 08/19/2020 07/23/2020 07/22/2020  WBC 4.0 - 10.5 K/uL 6.4 4.5 -  Hemoglobin 13.0 - 17.0 g/dL 10.4(L) 9.0(L) -  Hematocrit 39.0 - 52.0 % 31.8(L) 27.9(L) -  Platelets 150 - 400 K/uL 129(L) 135(L) 126(L)    CMP Latest Ref Rng & Units 08/19/2020 07/21/2020 07/20/2020  Glucose 70 - 99 mg/dL 142(H) 140(H) 155(H)  BUN 8 - 23 mg/dL '16 23 19  ' Creatinine 0.61 - 1.24 mg/dL 2.89(H) 5.06(H) 4.05(H)  Sodium 135 - 145 mmol/L 139 134(L) 138  Potassium 3.5 - 5.1 mmol/L 4.1 4.2 4.3  Chloride 98 - 111 mmol/L 100 99 103  CO2 22 - 32 mmol/L '30 27 24  ' Calcium 8.9 - 10.3 mg/dL 8.9 8.4(L) 8.3(L)  Total Protein 6.5 - 8.1 g/dL 5.6(L) - -  Total Bilirubin 0.3 - 1.2 mg/dL 1.2 - -  Alkaline Phos 38 - 126 U/L 83 - -  AST 15 - 41 U/L 22 - -  ALT 0 - 44 U/L 21 - -    Lipid Panel     Component Value Date/Time   CHOL 147 11/20/2013 1422   TRIG 160 (H) 11/20/2013 1422   HDL 30 (L) 11/20/2013 1422   CHOLHDL 4.9 11/20/2013 1422   VLDL  32 11/20/2013 1422   LDLCALC 85 11/20/2013 1422    Lab Results  Component Value Date   HGBA1C 5.5 05/23/2020   HGBA1C 8.6 (H) 01/27/2020   HGBA1C 6.5 (H) 09/10/2017   No components found for: NTPROBNP Lab Results  Component Value Date   TSH 1.480 10/08/2014   TSH 4.760 (H) 05/01/2014   TSH 2.048 10/21/2013    Cardiac Panel (last 3 results) No results for input(s): CKTOTAL, CKMB, TROPONINIHS, RELINDX in the last 72 hours.  External Labs:  10/06/2019: A1c 6.5%. Serum glucose 136 mg, BUN 55, creatinine 2.6, eGFR 25/30 ML. Potassium 5.4. CMP otherwise normal. HB 10.2/HCT 31.4, platelets 141. Normal indicis. Total cholesterol 95, triglycerides 98, HDL 34, LDL 41. Non-HDL cholesterol 61. PSA normal.  11/17/2020: BUN 32, creatinine 4.44, potassium 3.9, sodium 130  12/01/2020: Platelets 151, hemoglobin 9.6, hematocrit 28.4   IMPRESSION:    ICD-10-CM   1. Paroxysmal atrial fibrillation (HCC)  I48.0 EKG 12-Lead  2. Atherosclerosis of native coronary artery of native heart without angina pectoris  I25.10   3. Essential hypertension, benign  I10   4. Long term (current) use of anticoagulants  Z79.01     This patients CHA2DS2-VASc Score 3 (HTN, DM, vasc) and yearly risk of stroke 3.2%.   RECOMMENDATIONS: Alexander [name] is a 60 y.o. male whose past medical history and cardiovascular risk factors include: Paroxysmal atrial fibrillation, on oral anticoagulation, coronary artery disease with prior intervention, peripheral artery disease with prior bypass, status post right BKA, pacemaker implantation secondary to complete heart block (implanted 10/07/2014), former smoker, insulin-dependent diabetes mellitus, increased alcohol use, obesity due to excess calories.  1. Atherosclerosis of native coronary artery of native heart without angina pectoris Patient remains asymptomatic and is tolerating medical therapy well. There are no clinical signs of heart failure. Will continue present  guideline directed medical therapy including atorvastatin and metoprolol tartrate. Patient is not on ACE-I or ARB in view of CKD on dialysis. Patient not on  aspirin as he is presently on Eliquis and with continued anemia. I have personally reviewed external labs, patient remains anemic, will defer further management to nephology.   2. Paroxysmal atrial fibrillation (HCC) In office today patient is in normal sinus rhythm and well rate controlled. Nephology reduced Lopressor to 50 mg twice daily due to hypotension, will continue. Patient's CHA2DS2-VASc score of 3 with 3.2% annual risk of stroke. He is tolerating Eliquis without bleeding diathesis, will continue. Notably patient's creatinine is 2.8 (>1.5), however he is only 60 years old and weighs >60 kg, nephrologist was consulted regarding Eliquis dose and agrees.   Patient's recent pacemaker transmission revealed atrial fibrillation burden approaching 50%, however patient remains asymptomatic and therefore we will continue with rate control strategy.  Patient will notify our office if he develops symptoms, will consider antiarrhythmic therapy at that time. Reviewed and discussed with patient regarding recent pacemaker transmission.   3. Essential hypertension, benign Blood pressure remains well controlled despite reduction of Lopressor from 100 mg to 50 mg twice daily.  We will continue current antihypertensive medications including metoprolol, amlodipine, hydralazine, prazosin.  Patient continues to attend dialysis Tuesday, Thursday, Saturday.  Does report low blood pressures following dialysis, but he is asymptomatic.  Nephrology team has reduced metoprolol in view of postdialysis hypotension, will defer further management to nephology team as needed.  Discussed with patient the importance of monitoring his blood pressure regularly, he verbalized understanding. Patient is advised to notify our office if home BP readings >140/90 mmHg consistently.   4.  Long term (current) use of anticoagulants Patient denies bleeding diathesis on Eliquis.  Discussed the risks and benefits of oral anticoagulation patient verbalized understanding.  Will continue Eliquis 5 mg twice daily.  Educated on importance of improving his modifiable cardiovascular risk factors which include but not limited to: Decreasing alcohol consumption to no more than 2 drinks per day, blood pressure management, lipid management, and glycemic control.  Patient verbalizes understanding.  FINAL MEDICATION LIST END OF ENCOUNTER:   Current Outpatient Medications:  .  ACCU-CHEK FASTCLIX LANCETS MISC, Check blood sugar TID & QHS, Disp: 102 each, Rfl: 2 .  amLODipine (NORVASC) 5 MG tablet, Take 1 tablet (5 mg total) by mouth daily., Disp: 90 tablet, Rfl: 3 .  apixaban (ELIQUIS) 5 MG TABS tablet, Take 1 tablet (5 mg total) by mouth 2 (two) times daily., Disp: 180 tablet, Rfl: 0 .  atorvastatin (LIPITOR) 20 MG tablet, Take 1 tablet by mouth once daily, Disp: 90 tablet, Rfl: 0 .  blood glucose meter kit and supplies KIT, Check blood sugar TID & QHS, Disp: 1 each, Rfl: 0 .  Blood Glucose Monitoring Suppl (ACCU-CHEK ADVANTAGE DIABETES) kit, Use as instructed, Disp: 1 each, Rfl: 0 .  glucose blood (ACCU-CHEK AVIVA PLUS) test strip, Use as instructed, Disp: 100 each, Rfl: 12 .  glucose blood (CHOICE DM FORA G20 TEST STRIPS) test strip, Check blood sugar TID & QHS, Disp: 100 each, Rfl: 12 .  glucose blood test strip, Use as instructed, Disp: 100 each, Rfl: 12 .  hydrALAZINE (APRESOLINE) 100 MG tablet, Take 1 tablet (100 mg total) by mouth 2 (two) times daily., Disp: 180 tablet, Rfl: 3 .  insulin NPH-regular Human (70-30) 100 UNIT/ML injection, Inject 25 Units into the skin 2 (two) times daily with a meal., Disp: 10 mL, Rfl: 11 .  metoprolol tartrate (LOPRESSOR) 100 MG tablet, Take 1 tablet (100 mg total) by mouth 2 (two) times daily. (Patient taking differently: Take 50 mg  by mouth 2 (two) times  daily.), Disp: 180 tablet, Rfl: 3 .  Multiple Vitamin (MULTIVITAMIN ADULT) TABS, Take 1 tablet by mouth daily., Disp: , Rfl:  .  prazosin (MINIPRESS) 2 MG capsule, Take 1 capsule (2 mg total) by mouth daily after supper., Disp: 90 capsule, Rfl: 1  Orders Placed This Encounter  Procedures  . EKG 12-Lead   --Continue cardiac medications as reconciled in final medication list. --Return in about 6 months (around 06/06/2021) for CAD, PAF, HTN, . Or sooner if needed. --Continue follow-up with your primary care physician regarding the management of your other chronic comorbid conditions.  Patient's questions and concerns were addressed to his satisfaction. He voices understanding of the instructions provided during this encounter.    Alethia Berthold, PA-C 12/07/2020, 11:13 AM Office: [phone]

## 2020-12-07 ENCOUNTER — Encounter: Payer: Self-pay | Admitting: Student

## 2020-12-07 ENCOUNTER — Other Ambulatory Visit: Payer: Self-pay

## 2020-12-07 ENCOUNTER — Ambulatory Visit: Payer: Medicare Other | Admitting: Student

## 2020-12-07 VITALS — BP 125/63 | HR 78 | Temp 97.0°F | Resp 16 | Ht 72.0 in | Wt 270.0 lb

## 2020-12-07 DIAGNOSIS — Z7901 Long term (current) use of anticoagulants: Secondary | ICD-10-CM

## 2020-12-07 DIAGNOSIS — I480 Paroxysmal atrial fibrillation: Secondary | ICD-10-CM

## 2020-12-07 DIAGNOSIS — I2510 Atherosclerotic heart disease of native coronary artery without angina pectoris: Secondary | ICD-10-CM

## 2020-12-07 DIAGNOSIS — I10 Essential (primary) hypertension: Secondary | ICD-10-CM

## 2020-12-08 DIAGNOSIS — T8249XA Other complication of vascular dialysis catheter, initial encounter: Secondary | ICD-10-CM | POA: Diagnosis not present

## 2020-12-08 DIAGNOSIS — D689 Coagulation defect, unspecified: Secondary | ICD-10-CM | POA: Diagnosis not present

## 2020-12-08 DIAGNOSIS — N186 End stage renal disease: Secondary | ICD-10-CM | POA: Diagnosis not present

## 2020-12-08 DIAGNOSIS — E0821 Diabetes mellitus due to underlying condition with diabetic nephropathy: Secondary | ICD-10-CM | POA: Diagnosis not present

## 2020-12-08 DIAGNOSIS — Z992 Dependence on renal dialysis: Secondary | ICD-10-CM | POA: Diagnosis not present

## 2020-12-08 DIAGNOSIS — D509 Iron deficiency anemia, unspecified: Secondary | ICD-10-CM | POA: Diagnosis not present

## 2020-12-08 DIAGNOSIS — N2581 Secondary hyperparathyroidism of renal origin: Secondary | ICD-10-CM | POA: Diagnosis not present

## 2020-12-08 DIAGNOSIS — D631 Anemia in chronic kidney disease: Secondary | ICD-10-CM | POA: Diagnosis not present

## 2020-12-10 DIAGNOSIS — E0821 Diabetes mellitus due to underlying condition with diabetic nephropathy: Secondary | ICD-10-CM | POA: Diagnosis not present

## 2020-12-10 DIAGNOSIS — N2581 Secondary hyperparathyroidism of renal origin: Secondary | ICD-10-CM | POA: Diagnosis not present

## 2020-12-10 DIAGNOSIS — D631 Anemia in chronic kidney disease: Secondary | ICD-10-CM | POA: Diagnosis not present

## 2020-12-10 DIAGNOSIS — Z992 Dependence on renal dialysis: Secondary | ICD-10-CM | POA: Diagnosis not present

## 2020-12-10 DIAGNOSIS — D509 Iron deficiency anemia, unspecified: Secondary | ICD-10-CM | POA: Diagnosis not present

## 2020-12-10 DIAGNOSIS — N186 End stage renal disease: Secondary | ICD-10-CM | POA: Diagnosis not present

## 2020-12-10 DIAGNOSIS — T8249XA Other complication of vascular dialysis catheter, initial encounter: Secondary | ICD-10-CM | POA: Diagnosis not present

## 2020-12-10 DIAGNOSIS — D689 Coagulation defect, unspecified: Secondary | ICD-10-CM | POA: Diagnosis not present

## 2020-12-12 DIAGNOSIS — Z992 Dependence on renal dialysis: Secondary | ICD-10-CM | POA: Diagnosis not present

## 2020-12-12 DIAGNOSIS — N179 Acute kidney failure, unspecified: Secondary | ICD-10-CM | POA: Diagnosis not present

## 2020-12-12 DIAGNOSIS — N186 End stage renal disease: Secondary | ICD-10-CM | POA: Diagnosis not present

## 2020-12-13 DIAGNOSIS — N2581 Secondary hyperparathyroidism of renal origin: Secondary | ICD-10-CM | POA: Diagnosis not present

## 2020-12-13 DIAGNOSIS — D689 Coagulation defect, unspecified: Secondary | ICD-10-CM | POA: Diagnosis not present

## 2020-12-13 DIAGNOSIS — D509 Iron deficiency anemia, unspecified: Secondary | ICD-10-CM | POA: Diagnosis not present

## 2020-12-13 DIAGNOSIS — Z992 Dependence on renal dialysis: Secondary | ICD-10-CM | POA: Diagnosis not present

## 2020-12-13 DIAGNOSIS — D631 Anemia in chronic kidney disease: Secondary | ICD-10-CM | POA: Diagnosis not present

## 2020-12-13 DIAGNOSIS — E0821 Diabetes mellitus due to underlying condition with diabetic nephropathy: Secondary | ICD-10-CM | POA: Diagnosis not present

## 2020-12-13 DIAGNOSIS — T8249XA Other complication of vascular dialysis catheter, initial encounter: Secondary | ICD-10-CM | POA: Diagnosis not present

## 2020-12-13 DIAGNOSIS — N186 End stage renal disease: Secondary | ICD-10-CM | POA: Diagnosis not present

## 2020-12-15 DIAGNOSIS — D509 Iron deficiency anemia, unspecified: Secondary | ICD-10-CM | POA: Diagnosis not present

## 2020-12-15 DIAGNOSIS — D689 Coagulation defect, unspecified: Secondary | ICD-10-CM | POA: Diagnosis not present

## 2020-12-15 DIAGNOSIS — E0821 Diabetes mellitus due to underlying condition with diabetic nephropathy: Secondary | ICD-10-CM | POA: Diagnosis not present

## 2020-12-15 DIAGNOSIS — N186 End stage renal disease: Secondary | ICD-10-CM | POA: Diagnosis not present

## 2020-12-15 DIAGNOSIS — D631 Anemia in chronic kidney disease: Secondary | ICD-10-CM | POA: Diagnosis not present

## 2020-12-15 DIAGNOSIS — N2581 Secondary hyperparathyroidism of renal origin: Secondary | ICD-10-CM | POA: Diagnosis not present

## 2020-12-15 DIAGNOSIS — T8249XA Other complication of vascular dialysis catheter, initial encounter: Secondary | ICD-10-CM | POA: Diagnosis not present

## 2020-12-15 DIAGNOSIS — Z992 Dependence on renal dialysis: Secondary | ICD-10-CM | POA: Diagnosis not present

## 2020-12-17 DIAGNOSIS — D689 Coagulation defect, unspecified: Secondary | ICD-10-CM | POA: Diagnosis not present

## 2020-12-17 DIAGNOSIS — Z992 Dependence on renal dialysis: Secondary | ICD-10-CM | POA: Diagnosis not present

## 2020-12-17 DIAGNOSIS — D509 Iron deficiency anemia, unspecified: Secondary | ICD-10-CM | POA: Diagnosis not present

## 2020-12-17 DIAGNOSIS — D631 Anemia in chronic kidney disease: Secondary | ICD-10-CM | POA: Diagnosis not present

## 2020-12-17 DIAGNOSIS — N2581 Secondary hyperparathyroidism of renal origin: Secondary | ICD-10-CM | POA: Diagnosis not present

## 2020-12-17 DIAGNOSIS — E0821 Diabetes mellitus due to underlying condition with diabetic nephropathy: Secondary | ICD-10-CM | POA: Diagnosis not present

## 2020-12-17 DIAGNOSIS — T8249XA Other complication of vascular dialysis catheter, initial encounter: Secondary | ICD-10-CM | POA: Diagnosis not present

## 2020-12-17 DIAGNOSIS — N186 End stage renal disease: Secondary | ICD-10-CM | POA: Diagnosis not present

## 2020-12-20 DIAGNOSIS — T8249XA Other complication of vascular dialysis catheter, initial encounter: Secondary | ICD-10-CM | POA: Diagnosis not present

## 2020-12-20 DIAGNOSIS — N2581 Secondary hyperparathyroidism of renal origin: Secondary | ICD-10-CM | POA: Diagnosis not present

## 2020-12-20 DIAGNOSIS — D509 Iron deficiency anemia, unspecified: Secondary | ICD-10-CM | POA: Diagnosis not present

## 2020-12-20 DIAGNOSIS — N186 End stage renal disease: Secondary | ICD-10-CM | POA: Diagnosis not present

## 2020-12-20 DIAGNOSIS — Z992 Dependence on renal dialysis: Secondary | ICD-10-CM | POA: Diagnosis not present

## 2020-12-20 DIAGNOSIS — E0821 Diabetes mellitus due to underlying condition with diabetic nephropathy: Secondary | ICD-10-CM | POA: Diagnosis not present

## 2020-12-20 DIAGNOSIS — D631 Anemia in chronic kidney disease: Secondary | ICD-10-CM | POA: Diagnosis not present

## 2020-12-20 DIAGNOSIS — D689 Coagulation defect, unspecified: Secondary | ICD-10-CM | POA: Diagnosis not present

## 2020-12-22 DIAGNOSIS — N2581 Secondary hyperparathyroidism of renal origin: Secondary | ICD-10-CM | POA: Diagnosis not present

## 2020-12-22 DIAGNOSIS — N186 End stage renal disease: Secondary | ICD-10-CM | POA: Diagnosis not present

## 2020-12-22 DIAGNOSIS — Z992 Dependence on renal dialysis: Secondary | ICD-10-CM | POA: Diagnosis not present

## 2020-12-22 DIAGNOSIS — E0821 Diabetes mellitus due to underlying condition with diabetic nephropathy: Secondary | ICD-10-CM | POA: Diagnosis not present

## 2020-12-22 DIAGNOSIS — T8249XA Other complication of vascular dialysis catheter, initial encounter: Secondary | ICD-10-CM | POA: Diagnosis not present

## 2020-12-22 DIAGNOSIS — D631 Anemia in chronic kidney disease: Secondary | ICD-10-CM | POA: Diagnosis not present

## 2020-12-22 DIAGNOSIS — D509 Iron deficiency anemia, unspecified: Secondary | ICD-10-CM | POA: Diagnosis not present

## 2020-12-22 DIAGNOSIS — D689 Coagulation defect, unspecified: Secondary | ICD-10-CM | POA: Diagnosis not present

## 2020-12-24 DIAGNOSIS — T8249XA Other complication of vascular dialysis catheter, initial encounter: Secondary | ICD-10-CM | POA: Diagnosis not present

## 2020-12-24 DIAGNOSIS — D509 Iron deficiency anemia, unspecified: Secondary | ICD-10-CM | POA: Diagnosis not present

## 2020-12-24 DIAGNOSIS — N2581 Secondary hyperparathyroidism of renal origin: Secondary | ICD-10-CM | POA: Diagnosis not present

## 2020-12-24 DIAGNOSIS — Z992 Dependence on renal dialysis: Secondary | ICD-10-CM | POA: Diagnosis not present

## 2020-12-24 DIAGNOSIS — E0821 Diabetes mellitus due to underlying condition with diabetic nephropathy: Secondary | ICD-10-CM | POA: Diagnosis not present

## 2020-12-24 DIAGNOSIS — N186 End stage renal disease: Secondary | ICD-10-CM | POA: Diagnosis not present

## 2020-12-24 DIAGNOSIS — D631 Anemia in chronic kidney disease: Secondary | ICD-10-CM | POA: Diagnosis not present

## 2020-12-24 DIAGNOSIS — D689 Coagulation defect, unspecified: Secondary | ICD-10-CM | POA: Diagnosis not present

## 2020-12-26 DIAGNOSIS — Z89511 Acquired absence of right leg below knee: Secondary | ICD-10-CM | POA: Diagnosis not present

## 2020-12-27 DIAGNOSIS — D509 Iron deficiency anemia, unspecified: Secondary | ICD-10-CM | POA: Diagnosis not present

## 2020-12-27 DIAGNOSIS — E0821 Diabetes mellitus due to underlying condition with diabetic nephropathy: Secondary | ICD-10-CM | POA: Diagnosis not present

## 2020-12-27 DIAGNOSIS — Z992 Dependence on renal dialysis: Secondary | ICD-10-CM | POA: Diagnosis not present

## 2020-12-27 DIAGNOSIS — D631 Anemia in chronic kidney disease: Secondary | ICD-10-CM | POA: Diagnosis not present

## 2020-12-27 DIAGNOSIS — N2581 Secondary hyperparathyroidism of renal origin: Secondary | ICD-10-CM | POA: Diagnosis not present

## 2020-12-27 DIAGNOSIS — D689 Coagulation defect, unspecified: Secondary | ICD-10-CM | POA: Diagnosis not present

## 2020-12-27 DIAGNOSIS — T8249XA Other complication of vascular dialysis catheter, initial encounter: Secondary | ICD-10-CM | POA: Diagnosis not present

## 2020-12-27 DIAGNOSIS — N186 End stage renal disease: Secondary | ICD-10-CM | POA: Diagnosis not present

## 2020-12-29 ENCOUNTER — Other Ambulatory Visit: Payer: Self-pay

## 2020-12-29 DIAGNOSIS — D509 Iron deficiency anemia, unspecified: Secondary | ICD-10-CM | POA: Diagnosis not present

## 2020-12-29 DIAGNOSIS — E0821 Diabetes mellitus due to underlying condition with diabetic nephropathy: Secondary | ICD-10-CM | POA: Diagnosis not present

## 2020-12-29 DIAGNOSIS — Z992 Dependence on renal dialysis: Secondary | ICD-10-CM | POA: Diagnosis not present

## 2020-12-29 DIAGNOSIS — D631 Anemia in chronic kidney disease: Secondary | ICD-10-CM | POA: Diagnosis not present

## 2020-12-29 DIAGNOSIS — T8249XA Other complication of vascular dialysis catheter, initial encounter: Secondary | ICD-10-CM | POA: Diagnosis not present

## 2020-12-29 DIAGNOSIS — D689 Coagulation defect, unspecified: Secondary | ICD-10-CM | POA: Diagnosis not present

## 2020-12-29 DIAGNOSIS — N186 End stage renal disease: Secondary | ICD-10-CM | POA: Diagnosis not present

## 2020-12-29 DIAGNOSIS — N2581 Secondary hyperparathyroidism of renal origin: Secondary | ICD-10-CM | POA: Diagnosis not present

## 2020-12-29 DIAGNOSIS — Z89511 Acquired absence of right leg below knee: Secondary | ICD-10-CM

## 2020-12-31 DIAGNOSIS — D631 Anemia in chronic kidney disease: Secondary | ICD-10-CM | POA: Diagnosis not present

## 2020-12-31 DIAGNOSIS — T8249XA Other complication of vascular dialysis catheter, initial encounter: Secondary | ICD-10-CM | POA: Diagnosis not present

## 2020-12-31 DIAGNOSIS — Z992 Dependence on renal dialysis: Secondary | ICD-10-CM | POA: Diagnosis not present

## 2020-12-31 DIAGNOSIS — N2581 Secondary hyperparathyroidism of renal origin: Secondary | ICD-10-CM | POA: Diagnosis not present

## 2020-12-31 DIAGNOSIS — E0821 Diabetes mellitus due to underlying condition with diabetic nephropathy: Secondary | ICD-10-CM | POA: Diagnosis not present

## 2020-12-31 DIAGNOSIS — D509 Iron deficiency anemia, unspecified: Secondary | ICD-10-CM | POA: Diagnosis not present

## 2020-12-31 DIAGNOSIS — D689 Coagulation defect, unspecified: Secondary | ICD-10-CM | POA: Diagnosis not present

## 2020-12-31 DIAGNOSIS — N186 End stage renal disease: Secondary | ICD-10-CM | POA: Diagnosis not present

## 2021-01-03 DIAGNOSIS — D509 Iron deficiency anemia, unspecified: Secondary | ICD-10-CM | POA: Diagnosis not present

## 2021-01-03 DIAGNOSIS — N2581 Secondary hyperparathyroidism of renal origin: Secondary | ICD-10-CM | POA: Diagnosis not present

## 2021-01-03 DIAGNOSIS — D689 Coagulation defect, unspecified: Secondary | ICD-10-CM | POA: Diagnosis not present

## 2021-01-03 DIAGNOSIS — T8249XA Other complication of vascular dialysis catheter, initial encounter: Secondary | ICD-10-CM | POA: Diagnosis not present

## 2021-01-03 DIAGNOSIS — N186 End stage renal disease: Secondary | ICD-10-CM | POA: Diagnosis not present

## 2021-01-03 DIAGNOSIS — Z992 Dependence on renal dialysis: Secondary | ICD-10-CM | POA: Diagnosis not present

## 2021-01-03 DIAGNOSIS — D631 Anemia in chronic kidney disease: Secondary | ICD-10-CM | POA: Diagnosis not present

## 2021-01-03 DIAGNOSIS — E0821 Diabetes mellitus due to underlying condition with diabetic nephropathy: Secondary | ICD-10-CM | POA: Diagnosis not present

## 2021-01-04 ENCOUNTER — Telehealth: Payer: Self-pay | Admitting: Physical Therapy

## 2021-01-04 ENCOUNTER — Other Ambulatory Visit: Payer: Self-pay

## 2021-01-04 ENCOUNTER — Encounter: Payer: Self-pay | Admitting: Physical Therapy

## 2021-01-04 ENCOUNTER — Ambulatory Visit (INDEPENDENT_AMBULATORY_CARE_PROVIDER_SITE_OTHER): Payer: Medicare Other | Admitting: Physical Therapy

## 2021-01-04 DIAGNOSIS — R293 Abnormal posture: Secondary | ICD-10-CM | POA: Diagnosis not present

## 2021-01-04 DIAGNOSIS — R2689 Other abnormalities of gait and mobility: Secondary | ICD-10-CM | POA: Diagnosis not present

## 2021-01-04 DIAGNOSIS — R2681 Unsteadiness on feet: Secondary | ICD-10-CM | POA: Diagnosis not present

## 2021-01-04 DIAGNOSIS — M21372 Foot drop, left foot: Secondary | ICD-10-CM | POA: Diagnosis not present

## 2021-01-04 NOTE — Therapy (Signed)
[facility] [address] [place], [place], [address] Phone: [phone]   Fax:  [phone]  Physical Therapy Evaluation  Patient Details  Name: Alexander [name] MRN: [id number] Date of Birth: 1961-09-13 Referring Provider (PT): Meridee Score, MD   Encounter Date: 01/04/2021   PT End of Session - 01/04/21 1007    Visit Number 1    Number of Visits 25    Date for PT Re-Evaluation 04/04/21    Authorization Type BCBS Medicare    Authorization Time Period $40 co-pay    PT Start Time 0845    PT Stop Time 0928    PT Time Calculation (min) 43 min    Equipment Utilized During Treatment Gait belt    Activity Tolerance Patient tolerated treatment well    Behavior During Therapy [facility] for tasks assessed/performed           Past Medical History:  Diagnosis Date  . Alcohol abuse   . Anemia   . Arthritis    "patient does not think so."  . Atrial fibrillation ([place])   . Cardiac syncope 10/07/14   rapid A fib with 8 sec pauses on converison with syncope- temp pacing wire placed then PPM  . Cataract    BILATERAL  . Chronic kidney disease   . Coronary artery disease   . Diabetes mellitus    dx---been  awhile  . Encounter for care of pacemaker 10/08/2014  . History of blood transfusion   . Hyperlipemia   . Hypertension   . Osteomyelitis ([place])    right foot  . Peripheral vascular disease ([place])   . Presence of permanent cardiac pacemaker 10/08/2014  . Sinus node dysfunction ([place]) 10/08/2014    Past Surgical History:  Procedure Laterality Date  . ABDOMINAL AORTOGRAM W/LOWER EXTREMITY N/A 09/09/2017   Procedure: ABDOMINAL AORTOGRAM W/LOWER EXTREMITY;  Surgeon: Angelia Mould, MD;  Location: [place] [facility];  Service: Cardiovascular;  Laterality: N/A;  . ABDOMINAL AORTOGRAM W/LOWER EXTREMITY Right 11/11/2019   Procedure: ABDOMINAL AORTOGRAM W/LOWER EXTREMITY;  Surgeon: Marty Heck, MD;  Location: [place] [facility];  Service:  Cardiovascular;  Laterality: Right;  . AMPUTATION Left 08/19/2013   Procedure: AMPUTATION RAY;  Surgeon: Alta Corning, MD;  Location: [place];  Service: Orthopedics;  Laterality: Left;  ray amputation left 5th  . AMPUTATION Left 10/27/2013   Procedure: AMPUTATION DIGIT-LEFT 4TH TOE, 4th and 5th metatarsal.;  Surgeon: Angelia Mould, MD;  Location: [place];  Service: Vascular;  Laterality: Left;  . AMPUTATION Left 11/04/2013   Procedure: LEFT FOOT TRANS-METATARSAL AMPUTATION WITH WOUND CLOSURE ;  Surgeon: Alta Corning, MD;  Location: [place];  Service: Orthopedics;  Laterality: Left;  . AMPUTATION Right 09/10/2017   Procedure: RIGHT FOURTH AND FIFTH TOE AMPUTATION;  Surgeon: Angelia Mould, MD;  Location: [place];  Service: Vascular;  Laterality: Right;  . AMPUTATION Right 02/19/2020   Procedure: AMPUTATION RIGHT 3RD TOE;  Surgeon: Angelia Mould, MD;  Location: [place];  Service: Vascular;  Laterality: Right;  . AMPUTATION Right 03/11/2020   Procedure: RIGHT BELOW KNEE AMPUTATION;  Surgeon: Newt Minion, MD;  Location: [place];  Service: Orthopedics;  Laterality: Right;  . APPLICATION OF WOUND VAC Right 02/19/2020   Procedure: Application Of Wound Vac Right foot;  Surgeon: Angelia Mould, MD;  Location: [place];  Service: Vascular;  Laterality: Right;  . BIOPSY  07/20/2020   Procedure: BIOPSY;  Surgeon: Rush Landmark Telford Nab., MD;  Location:  [place] [facility];  Service: Gastroenterology;;  . CARDIAC CATHETERIZATION  10/07/2014   Procedure: LEFT HEART CATH AND CORONARY ANGIOGRAPHY;  Surgeon: Laverda Page, MD;  Location: [facility];  Service: Cardiovascular;;  . COLONOSCOPY W/ POLYPECTOMY    . COLONOSCOPY WITH PROPOFOL N/A 05/25/2020   Procedure: COLONOSCOPY WITH PROPOFOL;  Surgeon: Gatha Mayer, MD;  Location: [facility];  Service: Endoscopy;  Laterality: N/A;  . EMBOLECTOMY Right 09/12/2017   Procedure: Thrombectomy  & Redo Right Below Knee Popliteal Artery Bypass Graft  ;   Surgeon: Waynetta Sandy, MD;  Location: [place];  Service: Vascular;  Laterality: Right;  . ENTEROSCOPY N/A 07/20/2020   Procedure: ENTEROSCOPY;  Surgeon: Mansouraty, Telford Nab., MD;  Location: [place];  Service: Gastroenterology;  Laterality: N/A;  . ESOPHAGOGASTRODUODENOSCOPY (EGD) WITH PROPOFOL N/A 05/24/2020   Procedure: ESOPHAGOGASTRODUODENOSCOPY (EGD) WITH PROPOFOL;  Surgeon: Ladene Artist, MD;  Location: [facility];  Service: Endoscopy;  Laterality: N/A;  . EYE SURGERY Bilateral    cataract  . FEMORAL-TIBIAL BYPASS GRAFT Left 10/27/2013   Procedure: BYPASS GRAFT LEFT FEMORAL- POSTERIOR TIBIAL ARTERY;  Surgeon: Angelia Mould, MD;  Location: [place];  Service: Vascular;  Laterality: Left;  . FEMORAL-TIBIAL BYPASS GRAFT Right 09/10/2017   Procedure: RIGHT SUPERFICIAL  FEMORAL ARTERY-BELOW KNEE POPLITEAL ARTERY BYPASS GRAFT WITH VEIN;  Surgeon: Angelia Mould, MD;  Location: [place];  Service: Vascular;  Laterality: Right;  . GIVENS CAPSULE STUDY N/A 07/20/2020   Procedure: GIVENS CAPSULE STUDY;  Surgeon: Irving Copas., MD;  Location: [place];  Service: Gastroenterology;  Laterality: N/A;  capsule placed through scope into duodenum @ 0936  . I & D EXTREMITY Left 10/27/2013   Procedure: IRRIGATION AND DEBRIDEMENT EXTREMITY- LEFT FOOT;  Surgeon: Angelia Mould, MD;  Location: [place];  Service: Vascular;  Laterality: Left;  . I & D EXTREMITY Right 01/26/2020   Procedure: IRRIGATION AND DEBRIDEMENT EXTREMITY RIGHT FOOT WOUND;  Surgeon: Angelia Mould, MD;  Location: [place];  Service: Vascular;  Laterality: Right;  . INTRAOPERATIVE ARTERIOGRAM Right 09/10/2017   Procedure: INTRA OPERATIVE ARTERIOGRAM;  Surgeon: Angelia Mould, MD;  Location: [place];  Service: Vascular;  Laterality: Right;  . IR ANGIOGRAM FOLLOW UP STUDY  09/09/2017  . IR FLUORO GUIDE CV LINE RIGHT  05/31/2020  . IR US GUIDE VASC ACCESS RIGHT  06/01/2020  . LEFT HEART CATH  AND CORONARY ANGIOGRAPHY N/A 08/19/2020   Procedure: LEFT HEART CATH AND CORONARY ANGIOGRAPHY;  Surgeon: Adrian Prows, MD;  Location: [place] [facility];  Service: Cardiovascular;  Laterality: N/A;  . LEFT HEART CATHETERIZATION WITH CORONARY ANGIOGRAM N/A 11/09/2014   Procedure: LEFT HEART CATHETERIZATION WITH CORONARY ANGIOGRAM;  Surgeon: Laverda Page, MD;  Location: [facility] C[facility] LAB;  Service: Cardiovascular;  Laterality: N/A;  . LOWER EXTREMITY ANGIOGRAM Right 11/08/2015   Procedure: Lower Extremity Angiogram;  Surgeon: Serafina Mitchell, MD;  Location: [place] [facility];  Service: Cardiovascular;  Laterality: Right;  . LUMBAR LAMINECTOMY  06/13/2012   Procedure: MICRODISCECTOMY LUMBAR LAMINECTOMY;  Surgeon: Jessy Oto, MD;  Location: [place];  Service: Orthopedics;  Laterality: N/A;  Central laminectomy L2-3, L3-4, L4-5  . PACEMAKER INSERTION  10/08/14   MDT Advisa MRI compatible dual chamber pacemaker implanted by Dr Caryl Comes for syncope with post-termination pauses  . PERCUTANEOUS CORONARY STENT INTERVENTION (PCI-S)  11/09/2014   des to lad & distal circumflex         Dr  [name]  . PERIPHERAL VASCULAR  BALLOON ANGIOPLASTY Right 11/11/2019   Procedure: PERIPHERAL VASCULAR BALLOON ANGIOPLASTY;  Surgeon: Marty Heck, MD;  Location: [place] [facility];  Service: Cardiovascular;  Laterality: Right;  below knee popliteal, tibioperoneal trunk, posterior tibial arteries  . PERIPHERAL VASCULAR CATHETERIZATION N/A 11/08/2015   Procedure: Abdominal Aortogram;  Surgeon: Serafina Mitchell, MD;  Location: [place] [facility];  Service: Cardiovascular;  Laterality: N/A;  . PERIPHERAL VASCULAR CATHETERIZATION Right 11/08/2015   Procedure: Peripheral Vascular Atherectomy;  Surgeon: Serafina Mitchell, MD;  Location: [place] [facility];  Service: Cardiovascular;  Laterality: Right;  . PERIPHERAL VASCULAR CATHETERIZATION N/A 10/10/2016   Procedure: Lower Extremity Angiography;  Surgeon: Waynetta Sandy, MD;   Location: [place] [facility];  Service: Cardiovascular;  Laterality: N/A;  . PERIPHERAL VASCULAR CATHETERIZATION Right 10/10/2016   Procedure: Peripheral Vascular Atherectomy;  Surgeon: Waynetta Sandy, MD;  Location: [place] [facility];  Service: Cardiovascular;  Laterality: Right;  Popliteal  . PERMANENT PACEMAKER INSERTION N/A 10/08/2014   Procedure: PERMANENT PACEMAKER INSERTION;  Surgeon: Deboraha Sprang, MD;  Location: [facility] C[facility] LAB;  Service: Cardiovascular;  Laterality: N/A;  . SUBMUCOSAL TATTOO INJECTION  07/20/2020   Procedure: SUBMUCOSAL TATTOO INJECTION;  Surgeon: Irving Copas., MD;  Location: [place];  Service: Gastroenterology;;  . TEMPORARY PACEMAKER INSERTION Bilateral 10/07/2014   Procedure: TEMPORARY PACEMAKER INSERTION;  Surgeon: Laverda Page, MD;  Location: [facility] CATH LAB;  Service: Cardiovascular;  Laterality: Bilateral;  . WOUND DEBRIDEMENT Right 02/19/2020   Procedure: DEBRIDEMENT WOUND RIGHT FOOT;  Surgeon: Angelia Mould, MD;  Location: [place];  Service: Vascular;  Laterality: Right;    There were no vitals filed for this visit.    Subjective Assessment - 01/04/21 0853    Subjective This 60yo male was rrefered to PT by Meridee Score, MD with [phone] (ICD-10-CM) - Right below-knee amputee which was performed on 03/11/2020. His prosthesis was delivered on 12/26/2020.    Pertinent History Rt TTA, artthritis, A-Fib, cardiac syncope, cataract, CKD, CAD, DM, ESRD w/HD since 7/21, HTN, PVD, cardiac pacemaker,    Limitations Walking;House hold activities    Patient Stated Goals to use prosthesis to walk in community, to hunt & fish, yardwork & garden    Currently in Pain? No/denies              [facility] PT Assessment - 01/04/21 0845      Assessment   Medical Diagnosis Right Transtibial Amputation    Referring Provider (PT) Meridee Score, MD    Onset Date/Surgical Date 12/26/20   prosthesis delivery   Hand Dominance Right    Prior Therapy no with  amputation      Precautions   Precautions Fall;ICD/Pacemaker      Balance Screen   Has the patient fallen in the past 6 months No    Has the patient had a decrease in activity level because of a fear of falling?  Yes    Is the patient reluctant to leave their home because of a fear of falling?  No   in w/c     [place] Spouse/significant other;Children   2 adult children   Type of [place] entrance;Stairs to enter   ramp temporary   Entrance Stairs-Number of Steps 2    Entrance Stairs-Rails None    Home Layout One level    Home Equipment Wheelchair - [employer] - 2 wheels;Cane -  single point;Crutches;Bedside commode;Shower seat - built in;Grab bars - tub/shower      Prior Function   Level of Independence Independent;Independent with household mobility without device;Independent with community mobility without device    Vocation On disability    Leisure yardwork, gardening, fishing, hunting      Posture/Postural Control   Posture/Postural Control Postural limitations    Postural Limitations Rounded Shoulders;Forward head;Flexed trunk;Weight shift left      ROM / Strength   AROM / PROM / Strength AROM;Strength      AROM   Overall AROM  Within functional limits for tasks performed      Strength   Overall Strength Within functional limits for tasks performed      Transfers   Transfers Sit to Stand;Stand to Sit    Sit to Stand 5: Supervision;With upper extremity assist;With armrests;From chair/3-in-1;Other (comment)   requires RW support for stabilization   Stand to Sit 5: Supervision;With upper extremity assist;With armrests;To chair/3-in-1;Other (comment)   requires RW for stabilization     Ambulation/Gait   Ambulation/Gait Yes    Ambulation/Gait Assistance 4: Min assist    Ambulation/Gait Assistance Details excessive UE weight bearing on RW, partial weight shifted onto  prosthesis    Ambulation Distance (Feet) 75 Feet    Assistive device Rolling walker;Prosthesis    Gait Pattern Step-to pattern;Decreased step length - left;Decreased stance time - left;Decreased stride length;Decreased hip/knee flexion - right;Decreased dorsiflexion - left;Decreased weight shift to right;Right hip hike;Left hip hike;Right flexed knee in stance;Left flexed knee in stance;Antalgic;Lateral hip instability;Trunk flexed;Abducted- right;Poor foot clearance - left    Ambulation Surface Level;Indoor    Gait velocity 1.03 ft/sec      Standardized Balance Assessment   Standardized Balance Assessment Berg Balance Test      Berg Balance Test   Sit to Stand Needs minimal aid to stand or to stabilize   with RW = 3   Standing Unsupported Unable to stand 30 seconds unassisted   with RW = 3   Sitting with Back Unsupported but Feet Supported on Floor or Stool Able to sit safely and securely 2 minutes    Stand to Sit Sits independently, has uncontrolled descent   with RW = 3   Transfers Able to transfer safely, definite need of hands    Standing Unsupported with Eyes Closed Needs help to keep from falling   with RW = 2   Standing Unsupported with Feet Together Needs help to attain position and unable to hold for 15 seconds   with RW = 2   From Standing, Reach Forward with Outstretched Arm Loses balance while trying/requires external support   with RW = 2   From Standing Position, Pick up Object from Floor Unable to try/needs assist to keep balance   with RW = 1   From Standing Position, Turn to Look Behind Over each Shoulder Needs assist to keep from losing balance and falling   with RW = 2   Turn 360 Degrees Needs assistance while turning   with RW = 0   Standing Unsupported, Alternately Place Feet on Step/Stool Needs assistance to keep from falling or unable to try   with RW = 0   Standing Unsupported, One Foot in [employer] balance while stepping or standing   with RW = 2   Standing on One  Leg Unable to try or needs assist to prevent fall   with RW = 1   Total Score 9    Merrilee Jansky  comment: without RW support 9/56,  tasks of Berg with RW support 28/56           Prosthetics Assessment - 01/04/21 0845      Prosthetics   Prosthetic Care Dependent with Skin check;Residual limb care;Care of non-amputated limb;Prosthetic cleaning;Ply sock cleaning;Correct ply sock adjustment;Proper wear schedule/adjustment;Proper weight-bearing schedule/adjustment    Donning prosthesis  Supervision    Doffing prosthesis  Supervision    Current prosthetic wear tolerance (days/week)  daily    Current prosthetic wear tolerance (#hours/day)  4-5  hours 1x/day,  PT recommended starting with 4hrs 2x/day for 5-7 days, If no skin issues or limb pain, then increase 1hr/wear q5-7 days until >90% of awake hours.    Current prosthetic weight-bearing tolerance (hours/day)  Patient tolerated standing & gait with partial weight on prosthesis for 5 minutes with no c/o limb pain or discomfort.    Edema Pitting edema with 5-7 sec capillary refill    Residual limb condition  No open areas, normal color, tempertaure & moisture, shape cylinderical    Prosthesis Description silicon liner with shuttle pin lock suspension, total contact socket, dynamic response foot.                     Objective measurements completed on examination: See above findings.       [facility] Adult PT Treatment/Exercise - 01/04/21 0845      Prosthetics   Prosthetic Care Comments  weighing prosthesis for dialysis and subtracting from weigh-in & weigh-out.    Education Provided Skin check;Residual limb care;Prosthetic cleaning;Correct ply sock adjustment;Proper Donning;Proper wear schedule/adjustment;Other (comment)   see prosthetic care comments                   PT Short Term Goals - 01/04/21 2017      PT SHORT TERM GOAL #1   Title Patteint donnes prosthesis correctly and verbalizes proper cleaning.    Time 1    Period  Months    Status New    Target Date 02/01/21      PT SHORT TERM GOAL #2   Title Pateint tolerates prosthesis wear >10 hours total / day with skin issues.    Time 1    Period Months    Status New    Target Date 02/01/21      PT SHORT TERM GOAL #3   Title Pateint is able to reach 10" anteriorly & pick up item from floor with rolling walker safely.    Time 1    Period Months    Status New    Target Date 02/01/21      PT SHORT TERM GOAL #4   Title Pateint ambulates 200' with RW & prosthesis with supervision.    Time 1    Period Months    Status New    Target Date 02/01/21      PT SHORT TERM GOAL #5   Title Patient negotiates ramps & curbs with RW & prosthesis with minA.    Time 1    Period Months    Status New    Target Date 02/01/21             PT Long Term Goals - 01/04/21 1009      PT LONG TERM GOAL #1   Title Patient verbalizes & demonstrates understanding of prosthetic care to enable safe utilization of prosthesis.    Time 3    Period Months    Status New    Target  Date 04/03/21      PT LONG TERM GOAL #2   Title Patient tolerates wear of prosthesis >90% of awake hours without skin or limb pain issues to enable function throughout his day.    Time 3    Period Months    Status New    Target Date 04/03/21      PT LONG TERM GOAL #3   Title Task of Berg Balance >/= 45/56    Time 3    Period Months    Status New    Target Date 04/03/21      PT LONG TERM GOAL #4   Title Patient ambulates >400' with LRAD & prosthesis modified independent.    Time 3    Period Months    Status New    Target Date 04/03/21      PT LONG TERM GOAL #5   Title Patient negotiates ramps, curbs & stairs with LRAD & prosthesis modified independent.    Time 3    Period Months    Status New    Target Date 04/03/21                  Plan - 01/04/21 2025    Clinical Impression Statement This 60yo male underwent a right Transtibial Amputation on 03/11/2020 and received his  prosthesis on 12/26/2020.  He is dependent in proper prosthetic care which increases risk of skin issues.  He has limited wear which limits function during his awake hours.  He has impaired balance with [place] 9/56 and tasks of Berg with walker support 28/56 indicates high fall risk.  His prosthetic gait requires excessive UE weight bearing on rolling walker and gait deviations indicating high fall risk.  Patient would benefit from skilled PT to improve function and safety with prosthesis.    Personal Factors and Comorbidities Comorbidity 3+;Fitness;Time since onset of injury/illness/exacerbation;Past/Current Experience    Comorbidities Rt TTA, left Transmetatarsal Amputation, artthritis, A-Fib, cardiac syncope, cataract, CKD, CAD, DM, ESRD w/HD since 7/21, HTN, PVD, cardiac pacemaker,    Examination-Activity Limitations Locomotion Level;Squat;Stairs;Stand;Toileting;Transfers    Examination-Participation Restrictions Community Activity    Stability/Clinical Decision Making Evolving/Moderate complexity    Clinical Decision Making Moderate    Rehab Potential Good    PT Frequency 2x / week    PT Duration 12 weeks    PT Treatment/Interventions ADLs/Self Care Home Management;DME Instruction;Gait training;Stair training;Functional mobility training;Therapeutic activities;Therapeutic exercise;Balance training;Neuromuscular re-education;Patient/family education;Prosthetic Training;Vestibular    PT Next Visit Plan review prosthetic care, HEP at sink, instruct in prosthetic gait including ramps & curbs.    Recommended Other Services GrAFO partial foot prosthesis for LLE    Consulted and Agree with Plan of Care Patient           Patient will benefit from skilled therapeutic intervention in order to improve the following deficits and impairments:  Abnormal gait,Cardiopulmonary status limiting activity,Decreased activity tolerance,Decreased balance,Decreased endurance,Decreased knowledge of use of  DME,Decreased mobility,Decreased scar mobility,Decreased strength,Increased edema,Impaired flexibility,Postural dysfunction,Prosthetic Dependency,Obesity  Visit Diagnosis: Unsteadiness on feet  Other abnormalities of gait and mobility  Abnormal posture  Foot drop, left     Problem List Patient Active Problem List   Diagnosis Date Noted  . NSVT (nonsustained ventricular tachycardia) ([place])   . Pancytopenia ([place])   . Acute blood loss anemia 07/18/2020  . Hemodialysis patient ([place]) 06/04/2020  . Necrotizing glomerulonephritis 06/04/2020  . Heme positive stool   . Melena   . Upper GI bleeding 05/23/2020  . Acute GI bleeding  05/23/2020  . Acute hematogenous osteomyelitis of right foot ([place]) 03/11/2020  . Gangrene of right foot ([place])   . Diabetic foot ulcer associated with type 2 diabetes mellitus ([place]) 02/19/2020  . Heel ulcer ([place]) 11/05/2019  . Atherosclerotic PVD with ulceration ([place]) 09/09/2017  . Essential hypertension, benign 02/07/2015  . Renal insufficiency 02/07/2015  . Post PTCA 11/09/2014  . S/P PTCA (percutaneous transluminal coronary angioplasty) 11/09/2014  . Pacemaker: [place] DR MRI [id number] Dual chamber pacemaker 10/08/2014 10/08/2014  . Encounter for care of pacemaker 10/08/2014  . Sinus node dysfunction ([place]) 10/08/2014  . Cardiac asystole ([place]) 10/07/2014  . Cardiac syncope 10/07/2014  . Diabetes mellitus with stage 3 chronic kidney disease ([place]) 10/07/2014  . CAD (coronary artery disease), native coronary artery 10/07/2014  . Diabetes mellitus due to underlying condition without complications ([place]) 78/58/8502  . Anemia, unspecified 05/25/2014  . Syncope 04/30/2014  . Atherosclerosis of native arteries of the extremities with gangrene ([place]) 12/16/2013  . Volume overload 10/30/2013  . Acute renal failure ([place]) 10/28/2013  . PAD (peripheral artery disease) ([place]) 10/27/2013  . Leukocytosis 10/21/2013  . Anemia 10/21/2013  . Osteomyelitis of left  foot ([place]) 08/19/2013  . Spinal stenosis, lumbar region, with neurogenic claudication 06/12/2012    Class: Diagnosis of  . DIABETES MELLITUS, TYPE II 03/15/2009  . ALCOHOL ABUSE 03/15/2009  . TOBACCO USE 03/15/2009  . HYPERTENSION 03/15/2009    Jamey Reas, PT, DPT 01/04/2021, 8:34 PM  [facility] [address] [place], [place], [address] Phone: [phone]   Fax:  [phone]  Name: Alexander [name] MRN: [id number] Date of Birth: 17-Dec-1960

## 2021-01-04 NOTE — Telephone Encounter (Signed)
Mr. Bodkin needs a partial foot prosthesis with [employer] AFO for left leg. Can you please write a prescription and FAX it to [facility]? Thank you Shirlean Mylar

## 2021-01-05 DIAGNOSIS — D631 Anemia in chronic kidney disease: Secondary | ICD-10-CM | POA: Diagnosis not present

## 2021-01-05 DIAGNOSIS — D689 Coagulation defect, unspecified: Secondary | ICD-10-CM | POA: Diagnosis not present

## 2021-01-05 DIAGNOSIS — T8249XA Other complication of vascular dialysis catheter, initial encounter: Secondary | ICD-10-CM | POA: Diagnosis not present

## 2021-01-05 DIAGNOSIS — Z992 Dependence on renal dialysis: Secondary | ICD-10-CM | POA: Diagnosis not present

## 2021-01-05 DIAGNOSIS — N2581 Secondary hyperparathyroidism of renal origin: Secondary | ICD-10-CM | POA: Diagnosis not present

## 2021-01-05 DIAGNOSIS — N186 End stage renal disease: Secondary | ICD-10-CM | POA: Diagnosis not present

## 2021-01-05 DIAGNOSIS — E0821 Diabetes mellitus due to underlying condition with diabetic nephropathy: Secondary | ICD-10-CM | POA: Diagnosis not present

## 2021-01-05 DIAGNOSIS — D509 Iron deficiency anemia, unspecified: Secondary | ICD-10-CM | POA: Diagnosis not present

## 2021-01-05 NOTE — Telephone Encounter (Signed)
Thank you, rx sent

## 2021-01-07 ENCOUNTER — Telehealth (HOSPITAL_COMMUNITY): Payer: Self-pay | Admitting: Nephrology

## 2021-01-07 DIAGNOSIS — N2581 Secondary hyperparathyroidism of renal origin: Secondary | ICD-10-CM | POA: Diagnosis not present

## 2021-01-07 DIAGNOSIS — D631 Anemia in chronic kidney disease: Secondary | ICD-10-CM | POA: Diagnosis not present

## 2021-01-07 DIAGNOSIS — N186 End stage renal disease: Secondary | ICD-10-CM | POA: Diagnosis not present

## 2021-01-07 DIAGNOSIS — Z992 Dependence on renal dialysis: Secondary | ICD-10-CM | POA: Diagnosis not present

## 2021-01-07 DIAGNOSIS — D509 Iron deficiency anemia, unspecified: Secondary | ICD-10-CM | POA: Diagnosis not present

## 2021-01-07 DIAGNOSIS — T8249XA Other complication of vascular dialysis catheter, initial encounter: Secondary | ICD-10-CM | POA: Diagnosis not present

## 2021-01-07 DIAGNOSIS — E0821 Diabetes mellitus due to underlying condition with diabetic nephropathy: Secondary | ICD-10-CM | POA: Diagnosis not present

## 2021-01-07 DIAGNOSIS — D689 Coagulation defect, unspecified: Secondary | ICD-10-CM | POA: Diagnosis not present

## 2021-01-07 NOTE — Telephone Encounter (Signed)
DOCUMENTATION FOR CHART----   Received page from his HD unit this morning that HR is in 30's consistently. He is asymptomatic. Last week he was noted to have HR in 40's during HD - was taking metoprolol 50mg  BID at that time, told to halve dose but patient actually stopped the metoprolol at that time (so has not had any in the past week). Advised HD RN to finish his full dialysis session as long as asymptomatic and then have his family bring him to the ED to cardiac evaluation.  Veneta Penton, PA-C [employer] Pager [phone]

## 2021-01-09 ENCOUNTER — Other Ambulatory Visit: Payer: Self-pay

## 2021-01-09 ENCOUNTER — Encounter: Payer: Self-pay | Admitting: Physical Therapy

## 2021-01-09 ENCOUNTER — Ambulatory Visit (INDEPENDENT_AMBULATORY_CARE_PROVIDER_SITE_OTHER): Payer: Medicare Other | Admitting: Physical Therapy

## 2021-01-09 DIAGNOSIS — M21372 Foot drop, left foot: Secondary | ICD-10-CM

## 2021-01-09 DIAGNOSIS — Z992 Dependence on renal dialysis: Secondary | ICD-10-CM | POA: Diagnosis not present

## 2021-01-09 DIAGNOSIS — R2689 Other abnormalities of gait and mobility: Secondary | ICD-10-CM

## 2021-01-09 DIAGNOSIS — R2681 Unsteadiness on feet: Secondary | ICD-10-CM

## 2021-01-09 DIAGNOSIS — R293 Abnormal posture: Secondary | ICD-10-CM | POA: Diagnosis not present

## 2021-01-09 DIAGNOSIS — N179 Acute kidney failure, unspecified: Secondary | ICD-10-CM | POA: Diagnosis not present

## 2021-01-09 DIAGNOSIS — N186 End stage renal disease: Secondary | ICD-10-CM | POA: Diagnosis not present

## 2021-01-09 NOTE — Patient Instructions (Signed)
Sweating increases with an amputation. Your body is trying to regulate your temperature & without an extremity, you sweat more easily to cool off. Also prosthetic material like liners do not breath and add hot layers which causes even more sweating. With time your body typically will accommodate to prosthesis and your sweat level will come closer to level with amputation but not pre-amputation level.   You need to pat your limb & liner dry when you notice sweating. If you leave sweat trapped inside your liner, then it can result in a blister.   Signs of sweating in your liner: 1. You are sweating elsewhere on your body or you notice sweat running / dripping.  2. Take note of how high your liner comes up on your limb when you first put your liner on your limb. If you notice that your liner has slipped down, then you probably have sweat inside your liner. A good time to check for liner slippage is when toileting.  3. You feel air bubbles inside your liner. When you liner slips, then air is allowed in bottom. As you put weight on prosthesis, the air is burp or pushed out. 4. You feel something crawling or moving inside your liner. When sweat runs inside the closed system of liner, it often feels like a bug or something crawling inside your liner.  If any of above symptoms are noted, you need to remove your prosthesis & liner to pat your limb & liner dry. This is permanent need as leaving sweat or water trapped can result in a blister or wound.     Hanger Socks: 1-ply is yellow color at top, 3-ply is green at top, 5-ply is navy blue at top How many ply you need depends on your limb size.  You should have even pressure on your limb when standing & walking.  Guidance points: 1. How ease it goes on? Should be some resistance. Too few it goes on too easily. Too many it takes a lot of work to get it on. 2. How many clicks you get. Especially clicks in sitting. 3. After standing or walking, check knee cap.  Bottom should be just under the front lip.  Too few bottom of knee cap sits on indention. Too many bottom is above front lip. 4. Have your feet beside each other & hips over feet. Place hands on your waist. Pelvis Should be level. Too few prosthetic side will be low. Too many prosthetic side will be high.    Get ply socks correct before you leave the house. Take extra socks with you. Take one 3-ply and two 1-ply with you. This is in addition to what you are wearing.   

## 2021-01-09 NOTE — Therapy (Addendum)
[facility] Physical Therapy [address] [place], [place], [address] Phone: [phone]   Fax:  [phone]  Physical Therapy Treatment  Patient Details  Name: Alexander [name] MRN: [id number] Date of Birth: Dec 21, 1960 Referring Provider (PT): Meridee Score, MD   Encounter Date: 01/09/2021   PT End of Session - 01/09/21 0857    Visit Number 2    Number of Visits 25    Date for PT Re-Evaluation 04/04/21    Authorization Type BCBS Medicare    Authorization Time Period $40 co-pay    PT Start Time [phone]    PT Stop Time 0930    PT Time Calculation (min) 41 min    Equipment Utilized During Treatment Gait belt    Activity Tolerance Patient tolerated treatment well    Behavior During Therapy [facility] for tasks assessed/performed           Past Medical History:  Diagnosis Date  . Alcohol abuse   . Anemia   . Arthritis    "patient does not think so."  . Atrial fibrillation ([place])   . Cardiac syncope 10/07/14   rapid A fib with 8 sec pauses on converison with syncope- temp pacing wire placed then PPM  . Cataract    BILATERAL  . Chronic kidney disease   . Coronary artery disease   . Diabetes mellitus    dx---been  awhile  . Encounter for care of pacemaker 10/08/2014  . History of blood transfusion   . Hyperlipemia   . Hypertension   . Osteomyelitis ([place])    right foot  . Peripheral vascular disease ([place])   . Presence of permanent cardiac pacemaker 10/08/2014  . Sinus node dysfunction ([place]) 10/08/2014    Past Surgical History:  Procedure Laterality Date  . ABDOMINAL AORTOGRAM W/LOWER EXTREMITY N/A 09/09/2017   Procedure: ABDOMINAL AORTOGRAM W/LOWER EXTREMITY;  Surgeon: Angelia Mould, MD;  Location: [place] CV [facility];  Service: Cardiovascular;  Laterality: N/A;  . ABDOMINAL AORTOGRAM W/LOWER EXTREMITY Right 11/11/2019   Procedure: ABDOMINAL AORTOGRAM W/LOWER EXTREMITY;  Surgeon: Marty Heck, MD;  Location: [place] [facility];  Service:  Cardiovascular;  Laterality: Right;  . AMPUTATION Left 08/19/2013   Procedure: AMPUTATION RAY;  Surgeon: Alta Corning, MD;  Location: [place];  Service: Orthopedics;  Laterality: Left;  ray amputation left 5th  . AMPUTATION Left 10/27/2013   Procedure: AMPUTATION DIGIT-LEFT 4TH TOE, 4th and 5th metatarsal.;  Surgeon: Angelia Mould, MD;  Location: [place];  Service: Vascular;  Laterality: Left;  . AMPUTATION Left 11/04/2013   Procedure: LEFT FOOT TRANS-METATARSAL AMPUTATION WITH WOUND CLOSURE ;  Surgeon: Alta Corning, MD;  Location: [place];  Service: Orthopedics;  Laterality: Left;  . AMPUTATION Right 09/10/2017   Procedure: RIGHT FOURTH AND FIFTH TOE AMPUTATION;  Surgeon: Angelia Mould, MD;  Location: [place];  Service: Vascular;  Laterality: Right;  . AMPUTATION Right 02/19/2020   Procedure: AMPUTATION RIGHT 3RD TOE;  Surgeon: Angelia Mould, MD;  Location: [place];  Service: Vascular;  Laterality: Right;  . AMPUTATION Right 03/11/2020   Procedure: RIGHT BELOW KNEE AMPUTATION;  Surgeon: Newt Minion, MD;  Location: [place];  Service: Orthopedics;  Laterality: Right;  . APPLICATION OF WOUND VAC Right 02/19/2020   Procedure: Application Of Wound Vac Right foot;  Surgeon: Angelia Mould, MD;  Location: [place];  Service: Vascular;  Laterality: Right;  . BIOPSY  07/20/2020   Procedure: BIOPSY;  Surgeon: Rush Landmark Telford Nab., MD;  Location:  [place] ENDOS[facility];  Service: Gastroenterology;;  . CARDIAC CATHETERIZATION  10/07/2014   Procedure: LEFT HEART CATH AND CORONARY ANGIOGRAPHY;  Surgeon: Laverda Page, MD;  Location: [facility] C[facility] LAB;  Service: Cardiovascular;;  . COLONOSCOPY W/ POLYPECTOMY    . COLONOSCOPY WITH PROPOFOL N/A 05/25/2020   Procedure: COLONOSCOPY WITH PROPOFOL;  Surgeon: Gatha Mayer, MD;  Location: [facility];  Service: Endoscopy;  Laterality: N/A;  . EMBOLECTOMY Right 09/12/2017   Procedure: Thrombectomy  & Redo Right Below Knee Popliteal Artery Bypass Graft  ;   Surgeon: Waynetta Sandy, MD;  Location: [place];  Service: Vascular;  Laterality: Right;  . ENTEROSCOPY N/A 07/20/2020   Procedure: ENTEROSCOPY;  Surgeon: Mansouraty, Telford Nab., MD;  Location: [place];  Service: Gastroenterology;  Laterality: N/A;  . ESOPHAGOGASTRODUODENOSCOPY (EGD) WITH PROPOFOL N/A 05/24/2020   Procedure: ESOPHAGOGASTRODUODENOSCOPY (EGD) WITH PROPOFOL;  Surgeon: Ladene Artist, MD;  Location: [facility];  Service: Endoscopy;  Laterality: N/A;  . EYE SURGERY Bilateral    cataract  . FEMORAL-TIBIAL BYPASS GRAFT Left 10/27/2013   Procedure: BYPASS GRAFT LEFT FEMORAL- POSTERIOR TIBIAL ARTERY;  Surgeon: Angelia Mould, MD;  Location: [place];  Service: Vascular;  Laterality: Left;  . FEMORAL-TIBIAL BYPASS GRAFT Right 09/10/2017   Procedure: RIGHT SUPERFICIAL  FEMORAL ARTERY-BELOW KNEE POPLITEAL ARTERY BYPASS GRAFT WITH VEIN;  Surgeon: Angelia Mould, MD;  Location: [place];  Service: Vascular;  Laterality: Right;  . GIVENS CAPSULE STUDY N/A 07/20/2020   Procedure: GIVENS CAPSULE STUDY;  Surgeon: Irving Copas., MD;  Location: [place];  Service: Gastroenterology;  Laterality: N/A;  capsule placed through scope into duodenum @ 0936  . I & D EXTREMITY Left 10/27/2013   Procedure: IRRIGATION AND DEBRIDEMENT EXTREMITY- LEFT FOOT;  Surgeon: Angelia Mould, MD;  Location: [place];  Service: Vascular;  Laterality: Left;  . I & D EXTREMITY Right 01/26/2020   Procedure: IRRIGATION AND DEBRIDEMENT EXTREMITY RIGHT FOOT WOUND;  Surgeon: Angelia Mould, MD;  Location: [place];  Service: Vascular;  Laterality: Right;  . INTRAOPERATIVE ARTERIOGRAM Right 09/10/2017   Procedure: INTRA OPERATIVE ARTERIOGRAM;  Surgeon: Angelia Mould, MD;  Location: [place];  Service: Vascular;  Laterality: Right;  . IR ANGIOGRAM FOLLOW UP STUDY  09/09/2017  . IR FLUORO GUIDE CV LINE RIGHT  05/31/2020  . IR US GUIDE VASC ACCESS RIGHT  06/01/2020  . LEFT HEART CATH  AND CORONARY ANGIOGRAPHY N/A 08/19/2020   Procedure: LEFT HEART CATH AND CORONARY ANGIOGRAPHY;  Surgeon: Adrian Prows, MD;  Location: [place] CV LAB;  Service: Cardiovascular;  Laterality: N/A;  . LEFT HEART CATHETERIZATION WITH CORONARY ANGIOGRAM N/A 11/09/2014   Procedure: LEFT HEART CATHETERIZATION WITH CORONARY ANGIOGRAM;  Surgeon: Laverda Page, MD;  Location: [facility] CATH LAB;  Service: Cardiovascular;  Laterality: N/A;  . LOWER EXTREMITY ANGIOGRAM Right 11/08/2015   Procedure: Lower Extremity Angiogram;  Surgeon: Serafina Mitchell, MD;  Location: [place] CV LAB;  Service: Cardiovascular;  Laterality: Right;  . LUMBAR LAMINECTOMY  06/13/2012   Procedure: MICRODISCECTOMY LUMBAR LAMINECTOMY;  Surgeon: Jessy Oto, MD;  Location: [place];  Service: Orthopedics;  Laterality: N/A;  Central laminectomy L2-3, L3-4, L4-5  . PACEMAKER INSERTION  10/08/14   MDT Advisa MRI compatible dual chamber pacemaker implanted by Dr Caryl Comes for syncope with post-termination pauses  . PERCUTANEOUS CORONARY STENT INTERVENTION (PCI-S)  11/09/2014   des to lad & distal circumflex         Dr  [name]  . PERIPHERAL VASCULAR  BALLOON ANGIOPLASTY Right 11/11/2019   Procedure: PERIPHERAL VASCULAR BALLOON ANGIOPLASTY;  Surgeon: Marty Heck, MD;  Location: [place] CV LAB;  Service: Cardiovascular;  Laterality: Right;  below knee popliteal, tibioperoneal trunk, posterior tibial arteries  . PERIPHERAL VASCULAR CATHETERIZATION N/A 11/08/2015   Procedure: Abdominal Aortogram;  Surgeon: Serafina Mitchell, MD;  Location: [place] [facility];  Service: Cardiovascular;  Laterality: N/A;  . PERIPHERAL VASCULAR CATHETERIZATION Right 11/08/2015   Procedure: Peripheral Vascular Atherectomy;  Surgeon: Serafina Mitchell, MD;  Location: [place] CV LAB;  Service: Cardiovascular;  Laterality: Right;  . PERIPHERAL VASCULAR CATHETERIZATION N/A 10/10/2016   Procedure: Lower Extremity Angiography;  Surgeon: Waynetta Sandy, MD;   Location: [place] CV LAB;  Service: Cardiovascular;  Laterality: N/A;  . PERIPHERAL VASCULAR CATHETERIZATION Right 10/10/2016   Procedure: Peripheral Vascular Atherectomy;  Surgeon: Waynetta Sandy, MD;  Location: [place] CV LAB;  Service: Cardiovascular;  Laterality: Right;  Popliteal  . PERMANENT PACEMAKER INSERTION N/A 10/08/2014   Procedure: PERMANENT PACEMAKER INSERTION;  Surgeon: Deboraha Sprang, MD;  Location: [facility] CATH LAB;  Service: Cardiovascular;  Laterality: N/A;  . SUBMUCOSAL TATTOO INJECTION  07/20/2020   Procedure: SUBMUCOSAL TATTOO INJECTION;  Surgeon: Irving Copas., MD;  Location: [place];  Service: Gastroenterology;;  . TEMPORARY PACEMAKER INSERTION Bilateral 10/07/2014   Procedure: TEMPORARY PACEMAKER INSERTION;  Surgeon: Laverda Page, MD;  Location: [facility] CATH LAB;  Service: Cardiovascular;  Laterality: Bilateral;  . WOUND DEBRIDEMENT Right 02/19/2020   Procedure: DEBRIDEMENT WOUND RIGHT FOOT;  Surgeon: Angelia Mould, MD;  Location: [place];  Service: Vascular;  Laterality: Right;    There were no vitals filed for this visit.   Subjective Assessment - 01/09/21 0849    Subjective He has been wearing prosthesis 4hrs 2x/day on nondialysis days & 1x/day on dailysis days    Pertinent History Rt TTA, artthritis, A-Fib, cardiac syncope, cataract, CKD, CAD, DM, ESRD w/HD since 7/21, HTN, PVD, cardiac pacemaker,    Limitations Walking;House hold activities    Patient Stated Goals to use prosthesis to walk in community, to hunt & fish, yardwork & garden    Currently in Pain? No/denies                             [facility] Adult PT Treatment/Exercise - 01/09/21 0849      Transfers   Transfers Sit to Stand;Stand to Sit    Sit to Stand 5: Supervision;With upper extremity assist;With armrests;From chair/3-in-1;Other (comment)   requires RW support for stabilization   Stand to Sit 5: Supervision;With upper extremity assist;With  armrests;To chair/3-in-1;Other (comment)   requires RW for stabilization     Ambulation/Gait   Ambulation/Gait Yes    Ambulation/Gait Assistance 4: Min assist    Ambulation/Gait Assistance Details visual markers on RW for proper step width, verbal cues on upright posture    Ambulation Distance (Feet) 100 Feet   25' X 3 and 100' X 1   Assistive device Rolling walker;Prosthesis    Gait Pattern Step-to pattern;Decreased step length - left;Decreased stance time - left;Decreased stride length;Decreased hip/knee flexion - right;Decreased dorsiflexion - left;Decreased weight shift to right;Right hip hike;Left hip hike;Right flexed knee in stance;Left flexed knee in stance;Antalgic;Lateral hip instability;Trunk flexed;Abducted- right;Poor foot clearance - left    Ambulation Surface Level;Indoor    Gait velocity --      Posture/Postural Control   Posture/Postural Control --    Postural Limitations --  Exercises   Exercises Knee/Hip      Knee/Hip Exercises: Aerobic   Nustep seat 11 level 5 with BLEs & BUEs 8 min      Prosthetics   Prosthetic Care Comments  sweat signs with need to dry limb & liner.  adjusting ply socks donning & pressure with gait with too few, too many & correct ply socks.    Current prosthetic wear tolerance (days/week)  daily    Current prosthetic wear tolerance (#hours/day)  4-5  hours 1x/day,  PT recommended starting with 4hrs 2x/day for 5-7 days, If no skin issues or limb pain, then increase 1hr/wear q5-7 days until >90% of awake hours.    Current prosthetic weight-bearing tolerance (hours/day)  Patient tolerated standing & gait with partial weight on prosthesis for 5 minutes with no c/o limb pain or discomfort.    Edema Pitting edema with 5-7 sec capillary refill    Residual limb condition  No open areas, normal color, tempertaure & moisture, shape cylinderical    Education Provided Skin check;Residual limb care;Prosthetic cleaning;Correct ply sock adjustment;Proper  Donning;Proper wear schedule/adjustment;Other (comment)   see prosthetic care comments   Person(s) Educated Patient    Education Method Explanation;Demonstration;Tactile cues;Verbal cues    Education Method Verbalized understanding;Returned demonstration;Tactile cues required;Verbal cues required;Needs further instruction                    PT Short Term Goals - 01/04/21 2017      PT SHORT TERM GOAL #1   Title Patteint donnes prosthesis correctly and verbalizes proper cleaning.    Time 1    Period Months    Status New    Target Date 02/01/21      PT SHORT TERM GOAL #2   Title Pateint tolerates prosthesis wear >10 hours total / day with skin issues.    Time 1    Period Months    Status New    Target Date 02/01/21      PT SHORT TERM GOAL #3   Title Pateint is able to reach 10" anteriorly & pick up item from floor with rolling walker safely.    Time 1    Period Months    Status New    Target Date 02/01/21      PT SHORT TERM GOAL #4   Title Pateint ambulates 200' with RW & prosthesis with supervision.    Time 1    Period Months    Status New    Target Date 02/01/21      PT SHORT TERM GOAL #5   Title Patient negotiates ramps & curbs with RW & prosthesis with minA.    Time 1    Period Months    Status New    Target Date 02/01/21             PT Long Term Goals - 01/09/21 1257      PT LONG TERM GOAL #1   Title Patient verbalizes & demonstrates understanding of prosthetic care to enable safe utilization of prosthesis.    Time 3    Period Months    Status New      PT LONG TERM GOAL #2   Title Patient tolerates wear of prosthesis >90% of awake hours without skin or limb pain issues to enable function throughout his day.    Time 3    Period Months    Status New      PT LONG TERM GOAL #3   Title Task  of [place] Balance with RW  >/= 45/56    Time 3    Period Months    Status New      PT LONG TERM GOAL #4   Title Patient ambulates >400' with LRAD &  prosthesis modified independent.    Time 3    Period Months    Status New      PT LONG TERM GOAL #5   Title Patient negotiates ramps, curbs & stairs with LRAD & prosthesis modified independent.    Time 3    Period Months    Status New                 Plan - 01/09/21 0857    Clinical Impression Statement PT instructed in sweat signs / management and adjusting ply socks and he appears to have a general understanding.    Personal Factors and Comorbidities Comorbidity 3+;Fitness;Time since onset of injury/illness/exacerbation;Past/Current Experience    Comorbidities Rt TTA, left Transmetatarsal Amputation, artthritis, A-Fib, cardiac syncope, cataract, CKD, CAD, DM, ESRD w/HD since 7/21, HTN, PVD, cardiac pacemaker,    Examination-Activity Limitations Locomotion Level;Squat;Stairs;Stand;Toileting;Transfers    Examination-Participation Restrictions Community Activity    Stability/Clinical Decision Making Evolving/Moderate complexity    Rehab Potential Good    PT Frequency 2x / week    PT Duration 12 weeks    PT Treatment/Interventions ADLs/Self Care Home Management;DME Instruction;Gait training;Stair training;Functional mobility training;Therapeutic activities;Therapeutic exercise;Balance training;Neuromuscular re-education;Patient/family education;Prosthetic Training;Vestibular    PT Next Visit Plan review prosthetic care, HEP at sink, instruct in prosthetic gait including ramps & curbs.    Consulted and Agree with Plan of Care Patient           Patient will benefit from skilled therapeutic intervention in order to improve the following deficits and impairments:  Abnormal gait,Cardiopulmonary status limiting activity,Decreased activity tolerance,Decreased balance,Decreased endurance,Decreased knowledge of use of DME,Decreased mobility,Decreased scar mobility,Decreased strength,Increased edema,Impaired flexibility,Postural dysfunction,Prosthetic Dependency,Obesity  Visit  Diagnosis: Other abnormalities of gait and mobility  Unsteadiness on feet  Abnormal posture  Foot drop, left     Problem List Patient Active Problem List   Diagnosis Date Noted  . NSVT (nonsustained ventricular tachycardia) ([place])   . Pancytopenia ([place])   . Acute blood loss anemia 07/18/2020  . Hemodialysis patient ([place]) 06/04/2020  . Necrotizing glomerulonephritis 06/04/2020  . Heme positive stool   . Melena   . Upper GI bleeding 05/23/2020  . Acute GI bleeding 05/23/2020  . Acute hematogenous osteomyelitis of right foot ([place]) 03/11/2020  . Gangrene of right foot ([place])   . Diabetic foot ulcer associated with type 2 diabetes mellitus ([place]) 02/19/2020  . Heel ulcer ([place]) 11/05/2019  . Atherosclerotic PVD with ulceration ([place]) 09/09/2017  . Essential hypertension, benign 02/07/2015  . Renal insufficiency 02/07/2015  . Post PTCA 11/09/2014  . S/P PTCA (percutaneous transluminal coronary angioplasty) 11/09/2014  . Pacemaker: [place] DR MRI [id number] Dual chamber pacemaker 10/08/2014 10/08/2014  . Encounter for care of pacemaker 10/08/2014  . Sinus node dysfunction ([place]) 10/08/2014  . Cardiac asystole ([place]) 10/07/2014  . Cardiac syncope 10/07/2014  . Diabetes mellitus with stage 3 chronic kidney disease ([place]) 10/07/2014  . CAD (coronary artery disease), native coronary artery 10/07/2014  . Diabetes mellitus due to underlying condition without complications ([place]) 25/03/3975  . Anemia, unspecified 05/25/2014  . Syncope 04/30/2014  . Atherosclerosis of native arteries of the extremities with gangrene ([place]) 12/16/2013  . Volume overload 10/30/2013  . Acute renal failure ([place]) 10/28/2013  . PAD (peripheral artery  disease) ([place]) 10/27/2013  . Leukocytosis 10/21/2013  . Anemia 10/21/2013  . Osteomyelitis of left foot ([place]) 08/19/2013  . Spinal stenosis, lumbar region, with neurogenic claudication 06/12/2012    Class: Diagnosis of  . DIABETES MELLITUS, TYPE II 03/15/2009   . ALCOHOL ABUSE 03/15/2009  . TOBACCO USE 03/15/2009  . HYPERTENSION 03/15/2009    Jamey Reas, PT, DPT 01/09/2021, 12:57 PM  [facility] Physical Therapy [address] [place], [place], [address] Phone: [phone]   Fax:  [phone]  Name: TREVIAN HAYASHIDA MRN: [id number] Date of Birth: 1961-05-22

## 2021-01-10 DIAGNOSIS — E0821 Diabetes mellitus due to underlying condition with diabetic nephropathy: Secondary | ICD-10-CM | POA: Diagnosis not present

## 2021-01-10 DIAGNOSIS — T8249XA Other complication of vascular dialysis catheter, initial encounter: Secondary | ICD-10-CM | POA: Diagnosis not present

## 2021-01-10 DIAGNOSIS — Z992 Dependence on renal dialysis: Secondary | ICD-10-CM | POA: Diagnosis not present

## 2021-01-10 DIAGNOSIS — D689 Coagulation defect, unspecified: Secondary | ICD-10-CM | POA: Diagnosis not present

## 2021-01-10 DIAGNOSIS — D509 Iron deficiency anemia, unspecified: Secondary | ICD-10-CM | POA: Diagnosis not present

## 2021-01-10 DIAGNOSIS — N2581 Secondary hyperparathyroidism of renal origin: Secondary | ICD-10-CM | POA: Diagnosis not present

## 2021-01-10 DIAGNOSIS — N186 End stage renal disease: Secondary | ICD-10-CM | POA: Diagnosis not present

## 2021-01-10 DIAGNOSIS — D631 Anemia in chronic kidney disease: Secondary | ICD-10-CM | POA: Diagnosis not present

## 2021-01-11 ENCOUNTER — Other Ambulatory Visit: Payer: Self-pay

## 2021-01-11 ENCOUNTER — Ambulatory Visit: Payer: Medicare Other | Admitting: Cardiology

## 2021-01-11 ENCOUNTER — Ambulatory Visit (INDEPENDENT_AMBULATORY_CARE_PROVIDER_SITE_OTHER): Payer: Medicare Other | Admitting: Physical Therapy

## 2021-01-11 ENCOUNTER — Encounter: Payer: Self-pay | Admitting: Cardiology

## 2021-01-11 ENCOUNTER — Encounter: Payer: Self-pay | Admitting: Physical Therapy

## 2021-01-11 DIAGNOSIS — Z950 Presence of cardiac pacemaker: Secondary | ICD-10-CM

## 2021-01-11 DIAGNOSIS — R2689 Other abnormalities of gait and mobility: Secondary | ICD-10-CM

## 2021-01-11 DIAGNOSIS — I495 Sick sinus syndrome: Secondary | ICD-10-CM | POA: Diagnosis not present

## 2021-01-11 DIAGNOSIS — R2681 Unsteadiness on feet: Secondary | ICD-10-CM

## 2021-01-11 DIAGNOSIS — M21372 Foot drop, left foot: Secondary | ICD-10-CM | POA: Diagnosis not present

## 2021-01-11 DIAGNOSIS — R293 Abnormal posture: Secondary | ICD-10-CM

## 2021-01-11 DIAGNOSIS — Z45018 Encounter for adjustment and management of other part of cardiac pacemaker: Secondary | ICD-10-CM | POA: Diagnosis not present

## 2021-01-11 NOTE — Progress Notes (Signed)
    ICD-10-CM   1. Encounter for care of pacemaker  Z45.018   2. Pacemaker: Medtronic Advisa DR MRI [id number] Dual chamber pacemaker 10/08/2014  Z95.0   3. Sinus node dysfunction (HCC)  I49.5    Scheduled  In office pacemaker check 01/11/21  Single (S)/Dual (D)/BV: D. Presenting ASVS. Pacemaker dependant:  No. Underlying NSR. AP 46%, VP 2.2%. AMS Episodes Yes.  AT/AF burden 9.8% . Longest >3 hours. HVR 1 = A. Fib with RVR. Longevity 4.8 Years. Magnet rate: >85%. Lead measurements: Stable. Histogram: Low (L)/normal (N)/high (H)  Normal. Patient activity Very low < 1 hour.   Observations: Normal pacemaker function. Changes: None.   I received a message from nephrology regarding low heart rate and occasionally low blood pressure during dialysis.  Pacemaker is functioning normally, lower heart rate is set at 60.  He has had occasional episodes of PACs and PVCs where his heart rate on pacemaker interrogation reveals to be 40 but overall he has stable and normally functioning pacemaker.  He has had recent cardiac catheterization to evaluate for low blood pressure during dialysis and atypical chest pain, no significant disease was evident and patent prior stents.  Suspect low heart rate is an error and would recommend manual check.  Also advised the patient to restart antihypertensive medications only on nondialysis days.   Adrian Prows, MD, [facility] 01/11/2021, 6:33 PM   CC: Feliz Beam Office: [phone] Pager: [phone]

## 2021-01-11 NOTE — Therapy (Signed)
[facility] Physical Therapy [address] [place], [place], [address] Phone: [phone]   Fax:  [phone]  Physical Therapy Treatment  Patient Details  Name: Alexander [name] MRN: [id number] Date of Birth: [birth year] Referring Provider (PT): Meridee Score, MD   Encounter Date: 01/11/2021   PT End of Session - 01/11/21 0858    Visit Number 3    Number of Visits 25    Date for PT Re-Evaluation 04/04/21    Authorization Type BCBS Medicare    Authorization Time Period $40 co-pay    PT Start Time 0853    PT Stop Time 0931    PT Time Calculation (min) 38 min    Equipment Utilized During Treatment Gait belt    Activity Tolerance Patient tolerated treatment well    Behavior During Therapy [facility] for tasks assessed/performed           Past Medical History:  Diagnosis Date  . Alcohol abuse   . Anemia   . Arthritis    "patient does not think so."  . Atrial fibrillation ([place])   . Cardiac syncope 10/07/14   rapid A fib with 8 sec pauses on converison with syncope- temp pacing wire placed then PPM  . Cataract    BILATERAL  . Chronic kidney disease   . Coronary artery disease   . Diabetes mellitus    dx---been  awhile  . Encounter for care of pacemaker 10/08/2014  . History of blood transfusion   . Hyperlipemia   . Hypertension   . Osteomyelitis ([place])    right foot  . Peripheral vascular disease ([place])   . Presence of permanent cardiac pacemaker 10/08/2014  . Sinus node dysfunction ([place]) 10/08/2014    Past Surgical History:  Procedure Laterality Date  . ABDOMINAL AORTOGRAM W/LOWER EXTREMITY N/A 09/09/2017   Procedure: ABDOMINAL AORTOGRAM W/LOWER EXTREMITY;  Surgeon: Angelia Mould, MD;  Location: [place] CV [facility];  Service: Cardiovascular;  Laterality: N/A;  . ABDOMINAL AORTOGRAM W/LOWER EXTREMITY Right 11/11/2019   Procedure: ABDOMINAL AORTOGRAM W/LOWER EXTREMITY;  Surgeon: Marty Heck, MD;  Location: [place] [facility];  Service:  Cardiovascular;  Laterality: Right;  . AMPUTATION Left 08/19/2013   Procedure: AMPUTATION RAY;  Surgeon: Alta Corning, MD;  Location: [place];  Service: Orthopedics;  Laterality: Left;  ray amputation left 5th  . AMPUTATION Left 10/27/2013   Procedure: AMPUTATION DIGIT-LEFT 4TH TOE, 4th and 5th metatarsal.;  Surgeon: Angelia Mould, MD;  Location: [place];  Service: Vascular;  Laterality: Left;  . AMPUTATION Left 11/04/2013   Procedure: LEFT FOOT TRANS-METATARSAL AMPUTATION WITH WOUND CLOSURE ;  Surgeon: Alta Corning, MD;  Location: [place];  Service: Orthopedics;  Laterality: Left;  . AMPUTATION Right 09/10/2017   Procedure: RIGHT FOURTH AND FIFTH TOE AMPUTATION;  Surgeon: Angelia Mould, MD;  Location: [place];  Service: Vascular;  Laterality: Right;  . AMPUTATION Right 02/19/2020   Procedure: AMPUTATION RIGHT 3RD TOE;  Surgeon: Angelia Mould, MD;  Location: [place];  Service: Vascular;  Laterality: Right;  . AMPUTATION Right 03/11/2020   Procedure: RIGHT BELOW KNEE AMPUTATION;  Surgeon: Newt Minion, MD;  Location: [place];  Service: Orthopedics;  Laterality: Right;  . APPLICATION OF WOUND VAC Right 02/19/2020   Procedure: Application Of Wound Vac Right foot;  Surgeon: Angelia Mould, MD;  Location: [place];  Service: Vascular;  Laterality: Right;  . BIOPSY  07/20/2020   Procedure: BIOPSY;  Surgeon: Rush Landmark Telford Nab., MD;  Location:  [place] [facility];  Service: Gastroenterology;;  . CARDIAC CATHETERIZATION  10/07/2014   Procedure: LEFT HEART CATH AND CORONARY ANGIOGRAPHY;  Surgeon: Laverda Page, MD;  Location: [facility] C[facility];  Service: Cardiovascular;;  . COLONOSCOPY W/ POLYPECTOMY    . COLONOSCOPY WITH PROPOFOL N/A 05/25/2020   Procedure: COLONOSCOPY WITH PROPOFOL;  Surgeon: Gatha Mayer, MD;  Location: [facility];  Service: Endoscopy;  Laterality: N/A;  . EMBOLECTOMY Right 09/12/2017   Procedure: Thrombectomy  & Redo Right Below Knee Popliteal Artery Bypass Graft  ;   Surgeon: Waynetta Sandy, MD;  Location: [place];  Service: Vascular;  Laterality: Right;  . ENTEROSCOPY N/A 07/20/2020   Procedure: ENTEROSCOPY;  Surgeon: Mansouraty, Telford Nab., MD;  Location: [place];  Service: Gastroenterology;  Laterality: N/A;  . ESOPHAGOGASTRODUODENOSCOPY (EGD) WITH PROPOFOL N/A 05/24/2020   Procedure: ESOPHAGOGASTRODUODENOSCOPY (EGD) WITH PROPOFOL;  Surgeon: Ladene Artist, MD;  Location: [facility];  Service: Endoscopy;  Laterality: N/A;  . EYE SURGERY Bilateral    cataract  . FEMORAL-TIBIAL BYPASS GRAFT Left 10/27/2013   Procedure: BYPASS GRAFT LEFT FEMORAL- POSTERIOR TIBIAL ARTERY;  Surgeon: Angelia Mould, MD;  Location: [place];  Service: Vascular;  Laterality: Left;  . FEMORAL-TIBIAL BYPASS GRAFT Right 09/10/2017   Procedure: RIGHT SUPERFICIAL  FEMORAL ARTERY-BELOW KNEE POPLITEAL ARTERY BYPASS GRAFT WITH VEIN;  Surgeon: Angelia Mould, MD;  Location: [place];  Service: Vascular;  Laterality: Right;  . GIVENS CAPSULE STUDY N/A 07/20/2020   Procedure: GIVENS CAPSULE STUDY;  Surgeon: Irving Copas., MD;  Location: [place];  Service: Gastroenterology;  Laterality: N/A;  capsule placed through scope into duodenum @ 0936  . I & D EXTREMITY Left 10/27/2013   Procedure: IRRIGATION AND DEBRIDEMENT EXTREMITY- LEFT FOOT;  Surgeon: Angelia Mould, MD;  Location: [place];  Service: Vascular;  Laterality: Left;  . I & D EXTREMITY Right 01/26/2020   Procedure: IRRIGATION AND DEBRIDEMENT EXTREMITY RIGHT FOOT WOUND;  Surgeon: Angelia Mould, MD;  Location: [place];  Service: Vascular;  Laterality: Right;  . INTRAOPERATIVE ARTERIOGRAM Right 09/10/2017   Procedure: INTRA OPERATIVE ARTERIOGRAM;  Surgeon: Angelia Mould, MD;  Location: [place];  Service: Vascular;  Laterality: Right;  . IR ANGIOGRAM FOLLOW UP STUDY  09/09/2017  . IR FLUORO GUIDE CV LINE RIGHT  05/31/2020  . IR US GUIDE VASC ACCESS RIGHT  06/01/2020  . LEFT HEART CATH  AND CORONARY ANGIOGRAPHY N/A 08/19/2020   Procedure: LEFT HEART CATH AND CORONARY ANGIOGRAPHY;  Surgeon: Adrian Prows, MD;  Location: [place] CV LAB;  Service: Cardiovascular;  Laterality: N/A;  . LEFT HEART CATHETERIZATION WITH CORONARY ANGIOGRAM N/A 11/09/2014   Procedure: LEFT HEART CATHETERIZATION WITH CORONARY ANGIOGRAM;  Surgeon: Laverda Page, MD;  Location: [facility] CATH LAB;  Service: Cardiovascular;  Laterality: N/A;  . LOWER EXTREMITY ANGIOGRAM Right 11/08/2015   Procedure: Lower Extremity Angiogram;  Surgeon: Serafina Mitchell, MD;  Location: [place] CV [facility];  Service: Cardiovascular;  Laterality: Right;  . LUMBAR LAMINECTOMY  06/13/2012   Procedure: MICRODISCECTOMY LUMBAR LAMINECTOMY;  Surgeon: Jessy Oto, MD;  Location: [place];  Service: Orthopedics;  Laterality: N/A;  Central laminectomy L2-3, L3-4, L4-5  . PACEMAKER INSERTION  10/08/14   MDT Advisa MRI compatible dual chamber pacemaker implanted by Dr Caryl Comes for syncope with post-termination pauses  . PERCUTANEOUS CORONARY STENT INTERVENTION (PCI-S)  11/09/2014   des to lad & distal circumflex         Dr  [name]  . PERIPHERAL VASCULAR  BALLOON ANGIOPLASTY Right 11/11/2019   Procedure: PERIPHERAL VASCULAR BALLOON ANGIOPLASTY;  Surgeon: Marty Heck, MD;  Location: [place] CV [facility];  Service: Cardiovascular;  Laterality: Right;  below knee popliteal, tibioperoneal trunk, posterior tibial arteries  . PERIPHERAL VASCULAR CATHETERIZATION N/A 11/08/2015   Procedure: Abdominal Aortogram;  Surgeon: Serafina Mitchell, MD;  Location: [place] [facility];  Service: Cardiovascular;  Laterality: N/A;  . PERIPHERAL VASCULAR CATHETERIZATION Right 11/08/2015   Procedure: Peripheral Vascular Atherectomy;  Surgeon: Serafina Mitchell, MD;  Location: [place] [facility];  Service: Cardiovascular;  Laterality: Right;  . PERIPHERAL VASCULAR CATHETERIZATION N/A 10/10/2016   Procedure: Lower Extremity Angiography;  Surgeon: Waynetta Sandy, MD;   Location: [place] CV LAB;  Service: Cardiovascular;  Laterality: N/A;  . PERIPHERAL VASCULAR CATHETERIZATION Right 10/10/2016   Procedure: Peripheral Vascular Atherectomy;  Surgeon: Waynetta Sandy, MD;  Location: [place] [facility];  Service: Cardiovascular;  Laterality: Right;  Popliteal  . PERMANENT PACEMAKER INSERTION N/A 10/08/2014   Procedure: PERMANENT PACEMAKER INSERTION;  Surgeon: Deboraha Sprang, MD;  Location: [facility] CATH LAB;  Service: Cardiovascular;  Laterality: N/A;  . SUBMUCOSAL TATTOO INJECTION  07/20/2020   Procedure: SUBMUCOSAL TATTOO INJECTION;  Surgeon: Irving Copas., MD;  Location: [place];  Service: Gastroenterology;;  . TEMPORARY PACEMAKER INSERTION Bilateral 10/07/2014   Procedure: TEMPORARY PACEMAKER INSERTION;  Surgeon: Laverda Page, MD;  Location: [facility] CATH LAB;  Service: Cardiovascular;  Laterality: Bilateral;  . WOUND DEBRIDEMENT Right 02/19/2020   Procedure: DEBRIDEMENT WOUND RIGHT FOOT;  Surgeon: Angelia Mould, MD;  Location: [place];  Service: Vascular;  Laterality: Right;    There were no vitals filed for this visit.   Subjective Assessment - 01/11/21 0857    Subjective He is wearing prosthesis 8-10 hours total with 2 hr break between without any issues. He saw Brooke Pace, [facility] who tried brace vs plate. Chris recommended trying plate and high top hiking boot.    Pertinent History Rt TTA, artthritis, A-Fib, cardiac syncope, cataract, CKD, CAD, DM, ESRD w/HD since 7/21, HTN, PVD, cardiac pacemaker,    Limitations Walking;House hold activities    Patient Stated Goals to use prosthesis to walk in community, to hunt & fish, yardwork & garden    Currently in Pain? No/denies                             [facility] Adult PT Treatment/Exercise - 01/11/21 0853      Transfers   Transfers Sit to Stand;Stand to Sit    Sit to Stand 5: Supervision;With upper extremity assist;With armrests;From chair/3-in-1;Other (comment)    requires RW support for stabilization   Stand to Sit 5: Supervision;With upper extremity assist;With armrests;To chair/3-in-1;Other (comment)   requires RW for stabilization     Ambulation/Gait   Ambulation/Gait Yes    Ambulation/Gait Assistance 4: Min assist    Ambulation Distance (Feet) 100 Feet   25' X 3 and 100' X 1   Assistive device Rolling walker;Prosthesis    Gait Pattern Step-to pattern;Decreased step length - left;Decreased stance time - left;Decreased stride length;Decreased hip/knee flexion - right;Decreased dorsiflexion - left;Decreased weight shift to right;Right hip hike;Left hip hike;Right flexed knee in stance;Left flexed knee in stance;Antalgic;Lateral hip instability;Trunk flexed;Abducted- right;Poor foot clearance - left    Ramp 3: Mod assist   RW & prosthesis   Ramp Details (indicate cue type and reason) demo, verbal & tactile cues on technique  Curb 4: Min assist   RW & prosthesis   Curb Details (indicate cue type and reason) demo, verbal & tactile cues on technique      Exercises   Exercises Knee/Hip      Knee/Hip Exercises: Aerobic   Nustep seat 11 level 5 with BLEs & BUEs 8 min      Knee/Hip Exercises: Standing   Rocker Board 1 minute   ant/post & med/lat   Rocker Board Limitations demo, verbal & tactile cues on technique      Prosthetics   Prosthetic Care Comments  sweat signs with need to dry limb & liner    Current prosthetic wear tolerance (days/week)  daily    Current prosthetic wear tolerance (#hours/day)  4-5hrs 2x/day, PT recommended increasing to 6hrs 2x/day drying limb/liner half way ea wear.    Current prosthetic weight-bearing tolerance (hours/day)  Patient tolerated standing & gait with partial weight on prosthesis for 5 minutes with no c/o limb pain or discomfort.    Edema Pitting edema with 5-7 sec capillary refill    Residual limb condition  No open areas, normal color, tempertaure & moisture, shape cylinderical    Education Provided Skin  check;Residual limb care;Prosthetic cleaning;Correct ply sock adjustment;Proper Donning;Proper wear schedule/adjustment;Other (comment)   see prosthetic care comments   Person(s) Educated Patient    Education Method Explanation;Demonstration;Tactile cues;Verbal cues    Education Method Returned demonstration;Verbalized understanding;Tactile cues required;Verbal cues required;Needs further instruction                    PT Short Term Goals - 01/04/21 2017      PT SHORT TERM GOAL #1   Title Patteint donnes prosthesis correctly and verbalizes proper cleaning.    Time 1    Period Months    Status New    Target Date 02/01/21      PT SHORT TERM GOAL #2   Title Pateint tolerates prosthesis wear >10 hours total / day with skin issues.    Time 1    Period Months    Status New    Target Date 02/01/21      PT SHORT TERM GOAL #3   Title Pateint is able to reach 10" anteriorly & pick up item from floor with rolling walker safely.    Time 1    Period Months    Status New    Target Date 02/01/21      PT SHORT TERM GOAL #4   Title Pateint ambulates 200' with RW & prosthesis with supervision.    Time 1    Period Months    Status New    Target Date 02/01/21      PT SHORT TERM GOAL #5   Title Patient negotiates ramps & curbs with RW & prosthesis with minA.    Time 1    Period Months    Status New    Target Date 02/01/21             PT Long Term Goals - 01/09/21 1257      PT LONG TERM GOAL #1   Title Patient verbalizes & demonstrates understanding of prosthetic care to enable safe utilization of prosthesis.    Time 3    Period Months    Status New      PT LONG TERM GOAL #2   Title Patient tolerates wear of prosthesis >90% of awake hours without skin or limb pain issues to enable function throughout his day.  Time 3    Period Months    Status New      PT LONG TERM GOAL #3   Title Task of Berg Balance with RW  >/= 45/56    Time 3    Period Months    Status New       PT LONG TERM GOAL #4   Title Patient ambulates >400' with LRAD & prosthesis modified independent.    Time 3    Period Months    Status New      PT LONG TERM GOAL #5   Title Patient negotiates ramps, curbs & stairs with LRAD & prosthesis modified independent.    Time 3    Period Months    Status New                 Plan - 01/11/21 0859    Clinical Impression Statement PT introduced how to negotiate ramps & curbs with RW & TTA prosthesis. He appears to have general understanding. He is tolerating increased wear without issues.    Personal Factors and Comorbidities Comorbidity 3+;Fitness;Time since onset of injury/illness/exacerbation;Past/Current Experience    Comorbidities Rt TTA, left Transmetatarsal Amputation, artthritis, A-Fib, cardiac syncope, cataract, CKD, CAD, DM, ESRD w/HD since 7/21, HTN, PVD, cardiac pacemaker,    Examination-Activity Limitations Locomotion Level;Squat;Stairs;Stand;Toileting;Transfers    Examination-Participation Restrictions Community Activity    Stability/Clinical Decision Making Evolving/Moderate complexity    Rehab Potential Good    PT Frequency 2x / week    PT Duration 12 weeks    PT Treatment/Interventions ADLs/Self Care Home Management;DME Instruction;Gait training;Stair training;Functional mobility training;Therapeutic activities;Therapeutic exercise;Balance training;Neuromuscular re-education;Patient/family education;Prosthetic Training;Vestibular    PT Next Visit Plan review prosthetic care, HEP at sink, instruct in prosthetic gait including ramps & curbs.    Consulted and Agree with Plan of Care Patient           Patient will benefit from skilled therapeutic intervention in order to improve the following deficits and impairments:  Abnormal gait,Cardiopulmonary status limiting activity,Decreased activity tolerance,Decreased balance,Decreased endurance,Decreased knowledge of use of DME,Decreased mobility,Decreased scar  mobility,Decreased strength,Increased edema,Impaired flexibility,Postural dysfunction,Prosthetic Dependency,Obesity  Visit Diagnosis: Other abnormalities of gait and mobility  Unsteadiness on feet  Abnormal posture  Foot drop, left     Problem List Patient Active Problem List   Diagnosis Date Noted  . NSVT (nonsustained ventricular tachycardia) ([place])   . Pancytopenia ([place])   . Acute blood loss anemia 07/18/2020  . Hemodialysis patient ([place]) 06/04/2020  . Necrotizing glomerulonephritis 06/04/2020  . Heme positive stool   . Melena   . Upper GI bleeding 05/23/2020  . Acute GI bleeding 05/23/2020  . Acute hematogenous osteomyelitis of right foot ([place]) 03/11/2020  . Gangrene of right foot ([place])   . Diabetic foot ulcer associated with type 2 diabetes mellitus ([place]) 02/19/2020  . Heel ulcer ([place]) 11/05/2019  . Atherosclerotic PVD with ulceration ([place]) 09/09/2017  . Essential hypertension, benign 02/07/2015  . Renal insufficiency 02/07/2015  . Post PTCA 11/09/2014  . S/P PTCA (percutaneous transluminal coronary angioplasty) 11/09/2014  . Pacemaker: [place] DR MRI [id number] Dual chamber pacemaker 10/08/2014 10/08/2014  . Encounter for care of pacemaker 10/08/2014  . Sinus node dysfunction ([place]) 10/08/2014  . Cardiac asystole ([place]) 10/07/2014  . Cardiac syncope 10/07/2014  . Diabetes mellitus with stage 3 chronic kidney disease ([place]) 10/07/2014  . CAD (coronary artery disease), native coronary artery 10/07/2014  . Diabetes mellitus due to underlying condition without complications ([place]) 41/32/4401  . Anemia, unspecified 05/25/2014  .  Syncope 04/30/2014  . Atherosclerosis of native arteries of the extremities with gangrene ([place]) 12/16/2013  . Volume overload 10/30/2013  . Acute renal failure ([place]) 10/28/2013  . PAD (peripheral artery disease) ([place]) 10/27/2013  . Leukocytosis 10/21/2013  . Anemia 10/21/2013  . Osteomyelitis of left foot ([place]) 08/19/2013  . Spinal  stenosis, lumbar region, with neurogenic claudication 06/12/2012    Class: Diagnosis of  . DIABETES MELLITUS, TYPE II 03/15/2009  . ALCOHOL ABUSE 03/15/2009  . TOBACCO USE 03/15/2009  . HYPERTENSION 03/15/2009    Jamey Reas, PT, DPT 01/11/2021, 4:23 PM  [facility] Physical Therapy [address] [place], [place], [address] Phone: [phone]   Fax:  [phone]  Name: Alexander [name] MRN: [id number] Date of Birth: Jul 13, 1961

## 2021-01-12 DIAGNOSIS — D689 Coagulation defect, unspecified: Secondary | ICD-10-CM | POA: Diagnosis not present

## 2021-01-12 DIAGNOSIS — Z992 Dependence on renal dialysis: Secondary | ICD-10-CM | POA: Diagnosis not present

## 2021-01-12 DIAGNOSIS — T8249XA Other complication of vascular dialysis catheter, initial encounter: Secondary | ICD-10-CM | POA: Diagnosis not present

## 2021-01-12 DIAGNOSIS — N186 End stage renal disease: Secondary | ICD-10-CM | POA: Diagnosis not present

## 2021-01-12 DIAGNOSIS — D509 Iron deficiency anemia, unspecified: Secondary | ICD-10-CM | POA: Diagnosis not present

## 2021-01-12 DIAGNOSIS — N2581 Secondary hyperparathyroidism of renal origin: Secondary | ICD-10-CM | POA: Diagnosis not present

## 2021-01-12 DIAGNOSIS — D631 Anemia in chronic kidney disease: Secondary | ICD-10-CM | POA: Diagnosis not present

## 2021-01-12 DIAGNOSIS — E0821 Diabetes mellitus due to underlying condition with diabetic nephropathy: Secondary | ICD-10-CM | POA: Diagnosis not present

## 2021-01-14 DIAGNOSIS — T8249XA Other complication of vascular dialysis catheter, initial encounter: Secondary | ICD-10-CM | POA: Diagnosis not present

## 2021-01-14 DIAGNOSIS — D689 Coagulation defect, unspecified: Secondary | ICD-10-CM | POA: Diagnosis not present

## 2021-01-14 DIAGNOSIS — Z992 Dependence on renal dialysis: Secondary | ICD-10-CM | POA: Diagnosis not present

## 2021-01-14 DIAGNOSIS — E0821 Diabetes mellitus due to underlying condition with diabetic nephropathy: Secondary | ICD-10-CM | POA: Diagnosis not present

## 2021-01-14 DIAGNOSIS — D631 Anemia in chronic kidney disease: Secondary | ICD-10-CM | POA: Diagnosis not present

## 2021-01-14 DIAGNOSIS — N2581 Secondary hyperparathyroidism of renal origin: Secondary | ICD-10-CM | POA: Diagnosis not present

## 2021-01-14 DIAGNOSIS — D509 Iron deficiency anemia, unspecified: Secondary | ICD-10-CM | POA: Diagnosis not present

## 2021-01-14 DIAGNOSIS — N186 End stage renal disease: Secondary | ICD-10-CM | POA: Diagnosis not present

## 2021-01-16 ENCOUNTER — Ambulatory Visit (INDEPENDENT_AMBULATORY_CARE_PROVIDER_SITE_OTHER): Payer: Medicare Other | Admitting: Physical Therapy

## 2021-01-16 ENCOUNTER — Encounter: Payer: Self-pay | Admitting: Physical Therapy

## 2021-01-16 ENCOUNTER — Other Ambulatory Visit: Payer: Self-pay

## 2021-01-16 DIAGNOSIS — R2681 Unsteadiness on feet: Secondary | ICD-10-CM | POA: Diagnosis not present

## 2021-01-16 DIAGNOSIS — R2689 Other abnormalities of gait and mobility: Secondary | ICD-10-CM | POA: Diagnosis not present

## 2021-01-16 DIAGNOSIS — R293 Abnormal posture: Secondary | ICD-10-CM

## 2021-01-16 DIAGNOSIS — M21372 Foot drop, left foot: Secondary | ICD-10-CM | POA: Diagnosis not present

## 2021-01-16 NOTE — Patient Instructions (Signed)
Access Code: [email or web address] URL: [email or web address] Date: 01/16/2021 Prepared by: Jamey Reas  Exercises Seated Hamstring Stretch with Strap - 2 x daily - 7 x weekly - 1 sets - 3 reps - 30 seconds hold Sit to Stand with Counter Support - 1 x daily - 5 x weekly - 1 sets - 10 reps - 5 seconds hold Correct Standing Posture - 1 x daily - 5 x weekly - 1 sets - 10 reps - 5 seconds hold Alternating Over Head Reach - 1 x daily - 5 x weekly - 1 sets - 10 reps - 5 seconds hold Door frame overhead reaches stretch - 1 x daily - 5 x weekly - 1 sets - 10 reps - 5 seconds hold

## 2021-01-16 NOTE — Therapy (Addendum)
[facility] Physical Therapy [address] [place], [place], [address] Phone: [phone]   Fax:  [phone]  Physical Therapy Treatment  Patient Details  Name: Alexander [name] MRN: [id number] Date of Birth: 1961/10/17 Referring Provider (PT): Meridee Score, MD   Encounter Date: 01/16/2021   PT End of Session - 01/16/21 0902    Visit Number 4    Number of Visits 25    Date for PT Re-Evaluation 04/04/21    Authorization Type BCBS Medicare    Authorization Time Period $40 co-pay    PT Start Time 0855    PT Stop Time 0933    PT Time Calculation (min) 38 min    Equipment Utilized During Treatment Gait belt    Activity Tolerance Patient tolerated treatment well    Behavior During Therapy [facility] for tasks assessed/performed           Past Medical History:  Diagnosis Date  . Alcohol abuse   . Anemia   . Arthritis    "patient does not think so."  . Atrial fibrillation ([place])   . Cardiac syncope 10/07/14   rapid A fib with 8 sec pauses on converison with syncope- temp pacing wire placed then PPM  . Cataract    BILATERAL  . Chronic kidney disease   . Coronary artery disease   . Diabetes mellitus    dx---been  awhile  . Encounter for care of pacemaker 10/08/2014  . History of blood transfusion   . Hyperlipemia   . Hypertension   . Osteomyelitis ([place])    right foot  . Peripheral vascular disease ([place])   . Presence of permanent cardiac pacemaker 10/08/2014  . Sinus node dysfunction ([place]) 10/08/2014    Past Surgical History:  Procedure Laterality Date  . ABDOMINAL AORTOGRAM W/LOWER EXTREMITY N/A 09/09/2017   Procedure: ABDOMINAL AORTOGRAM W/LOWER EXTREMITY;  Surgeon: Angelia Mould, MD;  Location: [place] [facility];  Service: Cardiovascular;  Laterality: N/A;  . ABDOMINAL AORTOGRAM W/LOWER EXTREMITY Right 11/11/2019   Procedure: ABDOMINAL AORTOGRAM W/LOWER EXTREMITY;  Surgeon: Marty Heck, MD;  Location: [place] [facility];  Service:  Cardiovascular;  Laterality: Right;  . AMPUTATION Left 08/19/2013   Procedure: AMPUTATION RAY;  Surgeon: Alta Corning, MD;  Location: [place];  Service: Orthopedics;  Laterality: Left;  ray amputation left 5th  . AMPUTATION Left 10/27/2013   Procedure: AMPUTATION DIGIT-LEFT 4TH TOE, 4th and 5th metatarsal.;  Surgeon: Angelia Mould, MD;  Location: [place];  Service: Vascular;  Laterality: Left;  . AMPUTATION Left 11/04/2013   Procedure: LEFT FOOT TRANS-METATARSAL AMPUTATION WITH WOUND CLOSURE ;  Surgeon: Alta Corning, MD;  Location: [place];  Service: Orthopedics;  Laterality: Left;  . AMPUTATION Right 09/10/2017   Procedure: RIGHT FOURTH AND FIFTH TOE AMPUTATION;  Surgeon: Angelia Mould, MD;  Location: [place];  Service: Vascular;  Laterality: Right;  . AMPUTATION Right 02/19/2020   Procedure: AMPUTATION RIGHT 3RD TOE;  Surgeon: Angelia Mould, MD;  Location: [place];  Service: Vascular;  Laterality: Right;  . AMPUTATION Right 03/11/2020   Procedure: RIGHT BELOW KNEE AMPUTATION;  Surgeon: Newt Minion, MD;  Location: [place];  Service: Orthopedics;  Laterality: Right;  . APPLICATION OF WOUND VAC Right 02/19/2020   Procedure: Application Of Wound Vac Right foot;  Surgeon: Angelia Mould, MD;  Location: [place];  Service: Vascular;  Laterality: Right;  . BIOPSY  07/20/2020   Procedure: BIOPSY;  Surgeon: Rush Landmark Telford Nab., MD;  Location:  [place] [facility];  Service: Gastroenterology;;  . CARDIAC CATHETERIZATION  10/07/2014   Procedure: LEFT HEART CATH AND CORONARY ANGIOGRAPHY;  Surgeon: Laverda Page, MD;  Location: [facility];  Service: Cardiovascular;;  . COLONOSCOPY W/ POLYPECTOMY    . COLONOSCOPY WITH PROPOFOL N/A 05/25/2020   Procedure: COLONOSCOPY WITH PROPOFOL;  Surgeon: Gatha Mayer, MD;  Location: [facility];  Service: Endoscopy;  Laterality: N/A;  . EMBOLECTOMY Right 09/12/2017   Procedure: Thrombectomy  & Redo Right Below Knee Popliteal Artery Bypass Graft  ;   Surgeon: Waynetta Sandy, MD;  Location: [place];  Service: Vascular;  Laterality: Right;  . ENTEROSCOPY N/A 07/20/2020   Procedure: ENTEROSCOPY;  Surgeon: Mansouraty, Telford Nab., MD;  Location: [place];  Service: Gastroenterology;  Laterality: N/A;  . ESOPHAGOGASTRODUODENOSCOPY (EGD) WITH PROPOFOL N/A 05/24/2020   Procedure: ESOPHAGOGASTRODUODENOSCOPY (EGD) WITH PROPOFOL;  Surgeon: Ladene Artist, MD;  Location: [facility];  Service: Endoscopy;  Laterality: N/A;  . EYE SURGERY Bilateral    cataract  . FEMORAL-TIBIAL BYPASS GRAFT Left 10/27/2013   Procedure: BYPASS GRAFT LEFT FEMORAL- POSTERIOR TIBIAL ARTERY;  Surgeon: Angelia Mould, MD;  Location: [place];  Service: Vascular;  Laterality: Left;  . FEMORAL-TIBIAL BYPASS GRAFT Right 09/10/2017   Procedure: RIGHT SUPERFICIAL  FEMORAL ARTERY-BELOW KNEE POPLITEAL ARTERY BYPASS GRAFT WITH VEIN;  Surgeon: Angelia Mould, MD;  Location: [place];  Service: Vascular;  Laterality: Right;  . GIVENS CAPSULE STUDY N/A 07/20/2020   Procedure: GIVENS CAPSULE STUDY;  Surgeon: Irving Copas., MD;  Location: [place];  Service: Gastroenterology;  Laterality: N/A;  capsule placed through scope into duodenum @ 0936  . I & D EXTREMITY Left 10/27/2013   Procedure: IRRIGATION AND DEBRIDEMENT EXTREMITY- LEFT FOOT;  Surgeon: Angelia Mould, MD;  Location: [place];  Service: Vascular;  Laterality: Left;  . I & D EXTREMITY Right 01/26/2020   Procedure: IRRIGATION AND DEBRIDEMENT EXTREMITY RIGHT FOOT WOUND;  Surgeon: Angelia Mould, MD;  Location: [place];  Service: Vascular;  Laterality: Right;  . INTRAOPERATIVE ARTERIOGRAM Right 09/10/2017   Procedure: INTRA OPERATIVE ARTERIOGRAM;  Surgeon: Angelia Mould, MD;  Location: [place];  Service: Vascular;  Laterality: Right;  . IR ANGIOGRAM FOLLOW UP STUDY  09/09/2017  . IR FLUORO GUIDE CV LINE RIGHT  05/31/2020  . IR US GUIDE VASC ACCESS RIGHT  06/01/2020  . LEFT HEART CATH  AND CORONARY ANGIOGRAPHY N/A 08/19/2020   Procedure: LEFT HEART CATH AND CORONARY ANGIOGRAPHY;  Surgeon: Adrian Prows, MD;  Location: [place] [facility];  Service: Cardiovascular;  Laterality: N/A;  . LEFT HEART CATHETERIZATION WITH CORONARY ANGIOGRAM N/A 11/09/2014   Procedure: LEFT HEART CATHETERIZATION WITH CORONARY ANGIOGRAM;  Surgeon: Laverda Page, MD;  Location: [facility] CATH LAB;  Service: Cardiovascular;  Laterality: N/A;  . LOWER EXTREMITY ANGIOGRAM Right 11/08/2015   Procedure: Lower Extremity Angiogram;  Surgeon: Serafina Mitchell, MD;  Location: [place] [facility];  Service: Cardiovascular;  Laterality: Right;  . LUMBAR LAMINECTOMY  06/13/2012   Procedure: MICRODISCECTOMY LUMBAR LAMINECTOMY;  Surgeon: Jessy Oto, MD;  Location: [place];  Service: Orthopedics;  Laterality: N/A;  Central laminectomy L2-3, L3-4, L4-5  . PACEMAKER INSERTION  10/08/14   MDT Advisa MRI compatible dual chamber pacemaker implanted by Dr Caryl Comes for syncope with post-termination pauses  . PERCUTANEOUS CORONARY STENT INTERVENTION (PCI-S)  11/09/2014   des to lad & distal circumflex         Dr  [name]  . PERIPHERAL VASCULAR  BALLOON ANGIOPLASTY Right 11/11/2019   Procedure: PERIPHERAL VASCULAR BALLOON ANGIOPLASTY;  Surgeon: Marty Heck, MD;  Location: [place] [facility];  Service: Cardiovascular;  Laterality: Right;  below knee popliteal, tibioperoneal trunk, posterior tibial arteries  . PERIPHERAL VASCULAR CATHETERIZATION N/A 11/08/2015   Procedure: Abdominal Aortogram;  Surgeon: Serafina Mitchell, MD;  Location: [place] [facility];  Service: Cardiovascular;  Laterality: N/A;  . PERIPHERAL VASCULAR CATHETERIZATION Right 11/08/2015   Procedure: Peripheral Vascular Atherectomy;  Surgeon: Serafina Mitchell, MD;  Location: [place] [facility];  Service: Cardiovascular;  Laterality: Right;  . PERIPHERAL VASCULAR CATHETERIZATION N/A 10/10/2016   Procedure: Lower Extremity Angiography;  Surgeon: Waynetta Sandy, MD;   Location: [place] [facility];  Service: Cardiovascular;  Laterality: N/A;  . PERIPHERAL VASCULAR CATHETERIZATION Right 10/10/2016   Procedure: Peripheral Vascular Atherectomy;  Surgeon: Waynetta Sandy, MD;  Location: [place] [facility];  Service: Cardiovascular;  Laterality: Right;  Popliteal  . PERMANENT PACEMAKER INSERTION N/A 10/08/2014   Procedure: PERMANENT PACEMAKER INSERTION;  Surgeon: Deboraha Sprang, MD;  Location: [facility] C[facility] LAB;  Service: Cardiovascular;  Laterality: N/A;  . SUBMUCOSAL TATTOO INJECTION  07/20/2020   Procedure: SUBMUCOSAL TATTOO INJECTION;  Surgeon: Irving Copas., MD;  Location: [employer];  Service: Gastroenterology;;  . TEMPORARY PACEMAKER INSERTION Bilateral 10/07/2014   Procedure: TEMPORARY PACEMAKER INSERTION;  Surgeon: Laverda Page, MD;  Location: [facility] C[facility] LAB;  Service: Cardiovascular;  Laterality: Bilateral;  . WOUND DEBRIDEMENT Right 02/19/2020   Procedure: DEBRIDEMENT WOUND RIGHT FOOT;  Surgeon: Angelia Mould, MD;  Location: [place];  Service: Vascular;  Laterality: Right;    There were no vitals filed for this visit.   Subjective Assessment - 01/16/21 0855    Subjective He is wearing prosthesis 6-8 hrs both dialysis & nondialysis days. On nondialysis days he arises ~630 and goes to bed 5:00-6:00 due to dialysis next day.    Pertinent History Rt TTA, artthritis, A-Fib, cardiac syncope, cataract, CKD, CAD, DM, ESRD w/HD since 7/21, HTN, PVD, cardiac pacemaker,    Limitations Walking;House hold activities    Patient Stated Goals to use prosthesis to walk in community, to hunt & fish, yardwork & garden    Currently in Pain? No/denies                       Resting HR 68 after Nustep exercise 76 after standing balance activities 80 Appropriate response to activity however indicate deconditioned.       [place] Adult PT Treatment/Exercise - 01/16/21 0855      Transfers   Transfers Sit to Stand;Stand to Sit    Sit to  Stand 5: Supervision;With upper extremity assist;With armrests;From chair/3-in-1;Other (comment)   requires RW support for stabilization   Stand to Sit 5: Supervision;With upper extremity assist;With armrests;To chair/3-in-1;Other (comment)   requires RW for stabilization     Ambulation/Gait   Ambulation/Gait Yes    Ambulation/Gait Assistance 5: Supervision    Ambulation Distance (Feet) 100 Feet   25' X 3 and 100' X 1   Assistive device Rolling walker;Prosthesis    Gait Pattern --    Ramp --    Curb --      Neuro Re-ed    Neuro Re-ed Details  see pt instructions for exercises for posture & balance      Exercises   Exercises Knee/Hip      Knee/Hip Exercises: Aerobic   Nustep seat 11 level 6 with BLEs &  BUEs 8 min      Knee/Hip Exercises: Standing   Rocker Board --    Rocker Board Limitations --      Prosthetics   Prosthetic Care Comments  weighing & wearing prosthesis at dialysis. Increase wear to all awake hours except 2 hrs midday.    Current prosthetic wear tolerance (days/week)  daily    Current prosthetic wear tolerance (#hours/day)  5-7 hrs 1x/day    Current prosthetic weight-bearing tolerance (hours/day)  Patient tolerated standing & gait with partial weight on prosthesis for 5 minutes with no c/o limb pain or discomfort.    Edema Pitting edema with 5-7 sec capillary refill    Residual limb condition  No open areas, normal color, tempertaure & moisture, shape cylinderical    Education Provided Prosthetic cleaning;Correct ply sock adjustment;Proper Donning;Proper wear schedule/adjustment;Other (comment)   see prosthetic care comments   Person(s) Educated Patient    Education Method Explanation;Demonstration;Tactile cues;Verbal cues    Education Method Verbalized understanding;Tactile cues required;Verbal cues required;Needs further instruction    Donning Prosthesis Supervision                  PT Education - 01/16/21 0954    Education Details Access Code:  [email or web address]    Person(s) Educated Patient    Methods Explanation;Demonstration;Verbal cues;Tactile cues;Handout    Comprehension Verbalized understanding;Returned demonstration;Verbal cues required;Tactile cues required            PT Short Term Goals - 01/04/21 2017      PT SHORT TERM GOAL #1   Title Patteint donnes prosthesis correctly and verbalizes proper cleaning.    Time 1    Period Months    Status New    Target Date 02/01/21      PT SHORT TERM GOAL #2   Title Pateint tolerates prosthesis wear >10 hours total / day with skin issues.    Time 1    Period Months    Status New    Target Date 02/01/21      PT SHORT TERM GOAL #3   Title Pateint is able to reach 10" anteriorly & pick up item from floor with rolling walker safely.    Time 1    Period Months    Status New    Target Date 02/01/21      PT SHORT TERM GOAL #4   Title Pateint ambulates 200' with RW & prosthesis with supervision.    Time 1    Period Months    Status New    Target Date 02/01/21      PT SHORT TERM GOAL #5   Title Patient negotiates ramps & curbs with RW & prosthesis with minA.    Time 1    Period Months    Status New    Target Date 02/01/21             PT Long Term Goals - 01/09/21 1257      PT LONG TERM GOAL #1   Title Patient verbalizes & demonstrates understanding of prosthetic care to enable safe utilization of prosthesis.    Time 3    Period Months    Status New      PT LONG TERM GOAL #2   Title Patient tolerates wear of prosthesis >90% of awake hours without skin or limb pain issues to enable function throughout his day.    Time 3    Period Months    Status New      PT LONG TERM  GOAL #3   Title Task of Oceanographer with RW  >/= 45/56    Time 3    Period Months    Status New      PT LONG TERM GOAL #4   Title Patient ambulates >400' with LRAD & prosthesis modified independent.    Time 3    Period Months    Status New      PT LONG TERM GOAL #5   Title Patient  negotiates ramps, curbs & stairs with LRAD & prosthesis modified independent.    Time 3    Period Months    Status New                 Plan - 01/16/21 0902    Clinical Impression Statement PT session focused on exercises to improve posture & balance with equal weight bearing. He has a general understanding.    Personal Factors and Comorbidities Comorbidity 3+;Fitness;Time since onset of injury/illness/exacerbation;Past/Current Experience    Comorbidities Rt TTA, left Transmetatarsal Amputation, artthritis, A-Fib, cardiac syncope, cataract, CKD, CAD, DM, ESRD w/HD since 7/21, HTN, PVD, cardiac pacemaker,    Examination-Activity Limitations Locomotion Level;Squat;Stairs;Stand;Toileting;Transfers    Examination-Participation Restrictions Community Activity    Stability/Clinical Decision Making Evolving/Moderate complexity    Rehab Potential Good    PT Frequency 2x / week    PT Duration 12 weeks    PT Treatment/Interventions ADLs/Self Care Home Management;DME Instruction;Gait training;Stair training;Functional mobility training;Therapeutic activities;Therapeutic exercise;Balance training;Neuromuscular re-education;Patient/family education;Prosthetic Training;Vestibular    PT Next Visit Plan review prosthetic care, prosthetic gait including ramps & curbs.    Consulted and Agree with Plan of Care Patient           Patient will benefit from skilled therapeutic intervention in order to improve the following deficits and impairments:  Abnormal gait,Cardiopulmonary status limiting activity,Decreased activity tolerance,Decreased balance,Decreased endurance,Decreased knowledge of use of DME,Decreased mobility,Decreased scar mobility,Decreased strength,Increased edema,Impaired flexibility,Postural dysfunction,Prosthetic Dependency,Obesity  Visit Diagnosis: Other abnormalities of gait and mobility  Unsteadiness on feet  Abnormal posture  Foot drop, left     Problem List Patient  Active Problem List   Diagnosis Date Noted  . NSVT (nonsustained ventricular tachycardia) ([place])   . Pancytopenia ([place])   . Acute blood loss anemia 07/18/2020  . Hemodialysis patient ([place]) 06/04/2020  . Necrotizing glomerulonephritis 06/04/2020  . Heme positive stool   . Melena   . Upper GI bleeding 05/23/2020  . Acute GI bleeding 05/23/2020  . Acute hematogenous osteomyelitis of right foot ([place]) 03/11/2020  . Gangrene of right foot ([place])   . Diabetic foot ulcer associated with type 2 diabetes mellitus ([place]) 02/19/2020  . Heel ulcer ([place]) 11/05/2019  . Atherosclerotic PVD with ulceration ([place]) 09/09/2017  . Essential hypertension, benign 02/07/2015  . Renal insufficiency 02/07/2015  . Post PTCA 11/09/2014  . S/P PTCA (percutaneous transluminal coronary angioplasty) 11/09/2014  . Pacemaker: [place] DR MRI [id number] Dual chamber pacemaker 10/08/2014 10/08/2014  . Encounter for care of pacemaker 10/08/2014  . Sinus node dysfunction ([place]) 10/08/2014  . Cardiac asystole ([place]) 10/07/2014  . Cardiac syncope 10/07/2014  . Diabetes mellitus with stage 3 chronic kidney disease ([place]) 10/07/2014  . CAD (coronary artery disease), native coronary artery 10/07/2014  . Diabetes mellitus due to underlying condition without complications ([place]) 31/49/7026  . Anemia, unspecified 05/25/2014  . Syncope 04/30/2014  . Atherosclerosis of native arteries of the extremities with gangrene ([place]) 12/16/2013  . Volume overload 10/30/2013  . Acute renal failure ([place]) 10/28/2013  . PAD (peripheral  artery disease) ([place]) 10/27/2013  . Leukocytosis 10/21/2013  . Anemia 10/21/2013  . Osteomyelitis of left foot ([place]) 08/19/2013  . Spinal stenosis, lumbar region, with neurogenic claudication 06/12/2012    Class: Diagnosis of  . DIABETES MELLITUS, TYPE II 03/15/2009  . ALCOHOL ABUSE 03/15/2009  . TOBACCO USE 03/15/2009  . HYPERTENSION 03/15/2009    Jamey Reas, PT, DPT 01/16/2021, 10:04 AM  [facility] [address] [place], [place], [address] Phone: [phone]   Fax:  [phone]  Name: Alexander [name] MRN: [id number] Date of Birth: May 02, 1961

## 2021-01-17 DIAGNOSIS — T8249XA Other complication of vascular dialysis catheter, initial encounter: Secondary | ICD-10-CM | POA: Diagnosis not present

## 2021-01-17 DIAGNOSIS — N186 End stage renal disease: Secondary | ICD-10-CM | POA: Diagnosis not present

## 2021-01-17 DIAGNOSIS — D509 Iron deficiency anemia, unspecified: Secondary | ICD-10-CM | POA: Diagnosis not present

## 2021-01-17 DIAGNOSIS — D631 Anemia in chronic kidney disease: Secondary | ICD-10-CM | POA: Diagnosis not present

## 2021-01-17 DIAGNOSIS — Z992 Dependence on renal dialysis: Secondary | ICD-10-CM | POA: Diagnosis not present

## 2021-01-17 DIAGNOSIS — N2581 Secondary hyperparathyroidism of renal origin: Secondary | ICD-10-CM | POA: Diagnosis not present

## 2021-01-17 DIAGNOSIS — E0821 Diabetes mellitus due to underlying condition with diabetic nephropathy: Secondary | ICD-10-CM | POA: Diagnosis not present

## 2021-01-17 DIAGNOSIS — D689 Coagulation defect, unspecified: Secondary | ICD-10-CM | POA: Diagnosis not present

## 2021-01-18 ENCOUNTER — Encounter: Payer: Self-pay | Admitting: Physical Therapy

## 2021-01-18 ENCOUNTER — Ambulatory Visit (INDEPENDENT_AMBULATORY_CARE_PROVIDER_SITE_OTHER): Payer: Medicare Other | Admitting: Physical Therapy

## 2021-01-18 ENCOUNTER — Other Ambulatory Visit: Payer: Self-pay

## 2021-01-18 DIAGNOSIS — R293 Abnormal posture: Secondary | ICD-10-CM

## 2021-01-18 DIAGNOSIS — M21372 Foot drop, left foot: Secondary | ICD-10-CM

## 2021-01-18 DIAGNOSIS — R2681 Unsteadiness on feet: Secondary | ICD-10-CM | POA: Diagnosis not present

## 2021-01-18 DIAGNOSIS — R2689 Other abnormalities of gait and mobility: Secondary | ICD-10-CM | POA: Diagnosis not present

## 2021-01-18 NOTE — Therapy (Signed)
[facility] [address] [place], [place], [address] Phone: [phone]   Fax:  [phone]  Physical Therapy Treatment  Patient Details  Name: Alexander [name] MRN: [id number] Date of Birth: August 31, 1961 Referring Provider (PT): Meridee Score, MD   Encounter Date: 01/18/2021   PT End of Session - 01/18/21 0855    Visit Number 5    Number of Visits 25    Date for PT Re-Evaluation 04/04/21    Authorization Type BCBS Medicare    Authorization Time Period $40 co-pay    PT Start Time 0845    PT Stop Time 0930    PT Time Calculation (min) 45 min    Equipment Utilized During Treatment Gait belt    Activity Tolerance Patient tolerated treatment well    Behavior During Therapy [facility] for tasks assessed/performed           Past Medical History:  Diagnosis Date  . Alcohol abuse   . Anemia   . Arthritis    "patient does not think so."  . Atrial fibrillation ([place])   . Cardiac syncope 10/07/14   rapid A fib with 8 sec pauses on converison with syncope- temp pacing wire placed then PPM  . Cataract    BILATERAL  . Chronic kidney disease   . Coronary artery disease   . Diabetes mellitus    dx---been  awhile  . Encounter for care of pacemaker 10/08/2014  . History of blood transfusion   . Hyperlipemia   . Hypertension   . Osteomyelitis ([place])    right foot  . Peripheral vascular disease ([place])   . Presence of permanent cardiac pacemaker 10/08/2014  . Sinus node dysfunction ([place]) 10/08/2014    Past Surgical History:  Procedure Laterality Date  . ABDOMINAL AORTOGRAM W/LOWER EXTREMITY N/A 09/09/2017   Procedure: ABDOMINAL AORTOGRAM W/LOWER EXTREMITY;  Surgeon: Angelia Mould, MD;  Location: [place] [facility];  Service: Cardiovascular;  Laterality: N/A;  . ABDOMINAL AORTOGRAM W/LOWER EXTREMITY Right 11/11/2019   Procedure: ABDOMINAL AORTOGRAM W/LOWER EXTREMITY;  Surgeon: Marty Heck, MD;  Location: [place] [facility];  Service:  Cardiovascular;  Laterality: Right;  . AMPUTATION Left 08/19/2013   Procedure: AMPUTATION RAY;  Surgeon: Alta Corning, MD;  Location: [place];  Service: Orthopedics;  Laterality: Left;  ray amputation left 5th  . AMPUTATION Left 10/27/2013   Procedure: AMPUTATION DIGIT-LEFT 4TH TOE, 4th and 5th metatarsal.;  Surgeon: Angelia Mould, MD;  Location: [place];  Service: Vascular;  Laterality: Left;  . AMPUTATION Left 11/04/2013   Procedure: LEFT FOOT TRANS-METATARSAL AMPUTATION WITH WOUND CLOSURE ;  Surgeon: Alta Corning, MD;  Location: [place];  Service: Orthopedics;  Laterality: Left;  . AMPUTATION Right 09/10/2017   Procedure: RIGHT FOURTH AND FIFTH TOE AMPUTATION;  Surgeon: Angelia Mould, MD;  Location: [place];  Service: Vascular;  Laterality: Right;  . AMPUTATION Right 02/19/2020   Procedure: AMPUTATION RIGHT 3RD TOE;  Surgeon: Angelia Mould, MD;  Location: [place];  Service: Vascular;  Laterality: Right;  . AMPUTATION Right 03/11/2020   Procedure: RIGHT BELOW KNEE AMPUTATION;  Surgeon: Newt Minion, MD;  Location: [place];  Service: Orthopedics;  Laterality: Right;  . APPLICATION OF WOUND VAC Right 02/19/2020   Procedure: Application Of Wound Vac Right foot;  Surgeon: Angelia Mould, MD;  Location: [place];  Service: Vascular;  Laterality: Right;  . BIOPSY  07/20/2020   Procedure: BIOPSY;  Surgeon: Rush Landmark Telford Nab., MD;  Location:  [place] [facility];  Service: Gastroenterology;;  . CARDIAC CATHETERIZATION  10/07/2014   Procedure: LEFT HEART CATH AND CORONARY ANGIOGRAPHY;  Surgeon: Laverda Page, MD;  Location: [facility];  Service: Cardiovascular;;  . COLONOSCOPY W/ POLYPECTOMY    . COLONOSCOPY WITH PROPOFOL N/A 05/25/2020   Procedure: COLONOSCOPY WITH PROPOFOL;  Surgeon: Gatha Mayer, MD;  Location: [facility];  Service: Endoscopy;  Laterality: N/A;  . EMBOLECTOMY Right 09/12/2017   Procedure: Thrombectomy  & Redo Right Below Knee Popliteal Artery Bypass Graft  ;   Surgeon: Waynetta Sandy, MD;  Location: [place];  Service: Vascular;  Laterality: Right;  . ENTEROSCOPY N/A 07/20/2020   Procedure: ENTEROSCOPY;  Surgeon: Mansouraty, Telford Nab., MD;  Location: [place];  Service: Gastroenterology;  Laterality: N/A;  . ESOPHAGOGASTRODUODENOSCOPY (EGD) WITH PROPOFOL N/A 05/24/2020   Procedure: ESOPHAGOGASTRODUODENOSCOPY (EGD) WITH PROPOFOL;  Surgeon: Ladene Artist, MD;  Location: [facility];  Service: Endoscopy;  Laterality: N/A;  . EYE SURGERY Bilateral    cataract  . FEMORAL-TIBIAL BYPASS GRAFT Left 10/27/2013   Procedure: BYPASS GRAFT LEFT FEMORAL- POSTERIOR TIBIAL ARTERY;  Surgeon: Angelia Mould, MD;  Location: [place];  Service: Vascular;  Laterality: Left;  . FEMORAL-TIBIAL BYPASS GRAFT Right 09/10/2017   Procedure: RIGHT SUPERFICIAL  FEMORAL ARTERY-BELOW KNEE POPLITEAL ARTERY BYPASS GRAFT WITH VEIN;  Surgeon: Angelia Mould, MD;  Location: [place];  Service: Vascular;  Laterality: Right;  . GIVENS CAPSULE STUDY N/A 07/20/2020   Procedure: GIVENS CAPSULE STUDY;  Surgeon: Irving Copas., MD;  Location: [place];  Service: Gastroenterology;  Laterality: N/A;  capsule placed through scope into duodenum @ 0936  . I & D EXTREMITY Left 10/27/2013   Procedure: IRRIGATION AND DEBRIDEMENT EXTREMITY- LEFT FOOT;  Surgeon: Angelia Mould, MD;  Location: [place];  Service: Vascular;  Laterality: Left;  . I & D EXTREMITY Right 01/26/2020   Procedure: IRRIGATION AND DEBRIDEMENT EXTREMITY RIGHT FOOT WOUND;  Surgeon: Angelia Mould, MD;  Location: [place];  Service: Vascular;  Laterality: Right;  . INTRAOPERATIVE ARTERIOGRAM Right 09/10/2017   Procedure: INTRA OPERATIVE ARTERIOGRAM;  Surgeon: Angelia Mould, MD;  Location: [place];  Service: Vascular;  Laterality: Right;  . IR ANGIOGRAM FOLLOW UP STUDY  09/09/2017  . IR FLUORO GUIDE CV LINE RIGHT  05/31/2020  . IR US GUIDE VASC ACCESS RIGHT  06/01/2020  . LEFT HEART CATH  AND CORONARY ANGIOGRAPHY N/A 08/19/2020   Procedure: LEFT HEART CATH AND CORONARY ANGIOGRAPHY;  Surgeon: Adrian Prows, MD;  Location: [place] [facility];  Service: Cardiovascular;  Laterality: N/A;  . LEFT HEART CATHETERIZATION WITH CORONARY ANGIOGRAM N/A 11/09/2014   Procedure: LEFT HEART CATHETERIZATION WITH CORONARY ANGIOGRAM;  Surgeon: Laverda Page, MD;  Location: [facility] CATH LAB;  Service: Cardiovascular;  Laterality: N/A;  . LOWER EXTREMITY ANGIOGRAM Right 11/08/2015   Procedure: Lower Extremity Angiogram;  Surgeon: Serafina Mitchell, MD;  Location: [place] [facility];  Service: Cardiovascular;  Laterality: Right;  . LUMBAR LAMINECTOMY  06/13/2012   Procedure: MICRODISCECTOMY LUMBAR LAMINECTOMY;  Surgeon: Jessy Oto, MD;  Location: [place];  Service: Orthopedics;  Laterality: N/A;  Central laminectomy L2-3, L3-4, L4-5  . PACEMAKER INSERTION  10/08/14   MDT Advisa MRI compatible dual chamber pacemaker implanted by Dr Caryl Comes for syncope with post-termination pauses  . PERCUTANEOUS CORONARY STENT INTERVENTION (PCI-S)  11/09/2014   des to lad & distal circumflex         Dr  [name]  . PERIPHERAL VASCULAR  BALLOON ANGIOPLASTY Right 11/11/2019   Procedure: PERIPHERAL VASCULAR BALLOON ANGIOPLASTY;  Surgeon: Marty Heck, MD;  Location: [place] [facility];  Service: Cardiovascular;  Laterality: Right;  below knee popliteal, tibioperoneal trunk, posterior tibial arteries  . PERIPHERAL VASCULAR CATHETERIZATION N/A 11/08/2015   Procedure: Abdominal Aortogram;  Surgeon: Serafina Mitchell, MD;  Location: [place] [facility];  Service: Cardiovascular;  Laterality: N/A;  . PERIPHERAL VASCULAR CATHETERIZATION Right 11/08/2015   Procedure: Peripheral Vascular Atherectomy;  Surgeon: Serafina Mitchell, MD;  Location: [place] [facility];  Service: Cardiovascular;  Laterality: Right;  . PERIPHERAL VASCULAR CATHETERIZATION N/A 10/10/2016   Procedure: Lower Extremity Angiography;  Surgeon: Waynetta Sandy, MD;   Location: [place] [facility];  Service: Cardiovascular;  Laterality: N/A;  . PERIPHERAL VASCULAR CATHETERIZATION Right 10/10/2016   Procedure: Peripheral Vascular Atherectomy;  Surgeon: Waynetta Sandy, MD;  Location: [place] [facility];  Service: Cardiovascular;  Laterality: Right;  Popliteal  . PERMANENT PACEMAKER INSERTION N/A 10/08/2014   Procedure: PERMANENT PACEMAKER INSERTION;  Surgeon: Deboraha Sprang, MD;  Location: [facility] LAB;  Service: Cardiovascular;  Laterality: N/A;  . SUBMUCOSAL TATTOO INJECTION  07/20/2020   Procedure: SUBMUCOSAL TATTOO INJECTION;  Surgeon: Irving Copas., MD;  Location: [place];  Service: Gastroenterology;;  . TEMPORARY PACEMAKER INSERTION Bilateral 10/07/2014   Procedure: TEMPORARY PACEMAKER INSERTION;  Surgeon: Laverda Page, MD;  Location: [facility] CATH LAB;  Service: Cardiovascular;  Laterality: Bilateral;  . WOUND DEBRIDEMENT Right 02/19/2020   Procedure: DEBRIDEMENT WOUND RIGHT FOOT;  Surgeon: Angelia Mould, MD;  Location: [place];  Service: Vascular;  Laterality: Right;    There were no vitals filed for this visit.   Subjective Assessment - 01/18/21 0845    Subjective He is wearing prosthesis most of awake hours except 2 hrs midday on nondialysis days.  He has not weighed prosthesis for dialysis yet.    Pertinent History Rt TTA, artthritis, A-Fib, cardiac syncope, cataract, CKD, CAD, DM, ESRD w/HD since 7/21, HTN, PVD, cardiac pacemaker,    Limitations Walking;House hold activities    Patient Stated Goals to use prosthesis to walk in community, to hunt & fish, yardwork & garden    Currently in Pain? No/denies                             [facility] Adult PT Treatment/Exercise - 01/18/21 0845      Transfers   Transfers Sit to Stand;Stand to Sit    Sit to Stand 5: Supervision;With upper extremity assist;With armrests;From chair/3-in-1;Other (comment)   requires RW support for stabilization   Stand to Sit 5:  Supervision;With upper extremity assist;With armrests;To chair/3-in-1;Other (comment)   requires RW for stabilization     Ambulation/Gait   Ambulation/Gait --    Ambulation/Gait Assistance --    Ambulation Distance (Feet) --    Assistive device --      High Level Balance   High Level Balance Activities Side stepping;Turns;Backward walking   //bars   High Level Balance Comments PT demo & verbal cues on technique      Neuro Re-ed    Neuro Re-ed Details  see pt instructions for exercises for posture & balance      Exercises   Exercises Knee/Hip      Knee/Hip Exercises: Aerobic   Nustep seat 11 level 6 with BLEs & BUEs 8 min      Knee/Hip Exercises: Standing   Forward Step Up  Right;Left;1 set;10 reps;Hand Hold: 2;Step Height: 6"    Forward Step Up Limitations demo & verbal cues on technique    Step Down Right;Left;1 set;10 reps;Hand Hold: 2;Step Height: 6"    Rocker Board 1 minute   ant/mid/post & right/mid/left   Rocker Board Limitations mirror for visual upright, verbal cues on control      Knee/Hip Exercises: Seated   Sit to Sand 1 set;10 reps;without UE support   from 24" bar stool with goal to not touch //bars to stabilize, required intermittent touch & minA from PT to stabilize     Prosthetics   Prosthetic Care Comments  Increase wear to all awake hours except 2 hrs midday including after home from dialysis. reviewed use of antiperspirant to decrease sweating & risk of skin issues. recommended switching liners after dinner and washing one that he has worn earlier.    Current prosthetic wear tolerance (days/week)  daily    Current prosthetic wear tolerance (#hours/day)  nondialysis days all awake hours except 2 hrs midday. After dialysis only 1x for 3-5 hrs    Current prosthetic weight-bearing tolerance (hours/day)  Patient tolerated standing & gait with partial weight on prosthesis for 5 minutes with no c/o limb pain or discomfort.    Edema Pitting edema with 5-7 sec capillary  refill    Residual limb condition  No open areas, normal color, tempertaure & moisture, shape cylinderical    Education Provided Correct ply sock adjustment;Proper Donning;Proper wear schedule/adjustment;Other (comment)   see prosthetic care comments   Person(s) Educated Patient    Education Method Explanation;Verbal cues;Tactile cues    Education Method Verbalized understanding;Tactile cues required;Verbal cues required;Needs further instruction    Donning Prosthesis Supervision                    PT Short Term Goals - 01/18/21 0941      PT SHORT TERM GOAL #1   Title Patteint donnes prosthesis correctly and verbalizes proper cleaning.    Time 1    Period Months    Status On-going    Target Date 02/01/21      PT SHORT TERM GOAL #2   Title Pateint tolerates prosthesis wear >10 hours total / day with skin issues.    Time 1    Period Months    Status On-going    Target Date 02/01/21      PT SHORT TERM GOAL #3   Title Pateint is able to reach 10" anteriorly & pick up item from floor with rolling walker safely.    Time 1    Period Months    Status On-going    Target Date 02/01/21      PT SHORT TERM GOAL #4   Title Pateint ambulates 200' with RW & prosthesis with supervision.    Time 1    Period Months    Status On-going    Target Date 02/01/21      PT SHORT TERM GOAL #5   Title Patient negotiates ramps & curbs with RW & prosthesis with minA.    Time 1    Period Months    Status On-going    Target Date 02/01/21             PT Long Term Goals - 01/18/21 0941      PT LONG TERM GOAL #1   Title Patient verbalizes & demonstrates understanding of prosthetic care to enable safe utilization of prosthesis.    Time 3  Period Months    Status On-going    Target Date 04/03/21      PT LONG TERM GOAL #2   Title Patient tolerates wear of prosthesis >90% of awake hours without skin or limb pain issues to enable function throughout his day.    Time 3    Period  Months    Status On-going    Target Date 04/03/21      PT LONG TERM GOAL #3   Title Task of [place] Balance with RW  >/= 45/56    Time 3    Period Months    Status On-going    Target Date 04/03/21      PT LONG TERM GOAL #4   Title Patient ambulates >400' with LRAD & prosthesis modified independent.    Time 3    Period Months    Status On-going    Target Date 04/03/21      PT LONG TERM GOAL #5   Title Patient negotiates ramps, curbs & stairs with LRAD & prosthesis modified independent.    Time 3    Period Months    Status On-going    Target Date 04/03/21                 Plan - 01/18/21 0857    Clinical Impression Statement PT worked on standing strengthening activities. PT also worked on technique to move sideways, turn 90* and backward. He improved with instruction & practice but will need additional work.    Personal Factors and Comorbidities Comorbidity 3+;Fitness;Time since onset of injury/illness/exacerbation;Past/Current Experience    Comorbidities Rt TTA, left Transmetatarsal Amputation, artthritis, A-Fib, cardiac syncope, cataract, CKD, CAD, DM, ESRD w/HD since 7/21, HTN, PVD, cardiac pacemaker,    Examination-Activity Limitations Locomotion Level;Squat;Stairs;Stand;Toileting;Transfers    Examination-Participation Restrictions Community Activity    Stability/Clinical Decision Making Evolving/Moderate complexity    Rehab Potential Good    PT Frequency 2x / week    PT Duration 12 weeks    PT Treatment/Interventions ADLs/Self Care Home Management;DME Instruction;Gait training;Stair training;Functional mobility training;Therapeutic activities;Therapeutic exercise;Balance training;Neuromuscular re-education;Patient/family education;Prosthetic Training;Vestibular    PT Next Visit Plan review prosthetic care, prosthetic gait including ramps & curbs. standing balance & strength    Consulted and Agree with Plan of Care Patient           Patient will benefit from skilled  therapeutic intervention in order to improve the following deficits and impairments:  Abnormal gait,Cardiopulmonary status limiting activity,Decreased activity tolerance,Decreased balance,Decreased endurance,Decreased knowledge of use of DME,Decreased mobility,Decreased scar mobility,Decreased strength,Increased edema,Impaired flexibility,Postural dysfunction,Prosthetic Dependency,Obesity  Visit Diagnosis: Other abnormalities of gait and mobility  Unsteadiness on feet  Abnormal posture  Foot drop, left     Problem List Patient Active Problem List   Diagnosis Date Noted  . NSVT (nonsustained ventricular tachycardia) ([place])   . Pancytopenia ([place])   . Acute blood loss anemia 07/18/2020  . Hemodialysis patient ([place]) 06/04/2020  . Necrotizing glomerulonephritis 06/04/2020  . Heme positive stool   . Melena   . Upper GI bleeding 05/23/2020  . Acute GI bleeding 05/23/2020  . Acute hematogenous osteomyelitis of right foot ([place]) 03/11/2020  . Gangrene of right foot ([place])   . Diabetic foot ulcer associated with type 2 diabetes mellitus ([place]) 02/19/2020  . Heel ulcer ([place]) 11/05/2019  . Atherosclerotic PVD with ulceration ([place]) 09/09/2017  . Essential hypertension, benign 02/07/2015  . Renal insufficiency 02/07/2015  . Post PTCA 11/09/2014  . S/P PTCA (percutaneous transluminal coronary angioplasty) 11/09/2014  . Pacemaker:  Medtronic Advisa DR MRI [id number] Dual chamber pacemaker 10/08/2014 10/08/2014  . Encounter for care of pacemaker 10/08/2014  . Sinus node dysfunction ([place]) 10/08/2014  . Cardiac asystole ([place]) 10/07/2014  . Cardiac syncope 10/07/2014  . Diabetes mellitus with stage 3 chronic kidney disease ([place]) 10/07/2014  . CAD (coronary artery disease), native coronary artery 10/07/2014  . Diabetes mellitus due to underlying condition without complications ([place]) 04/59/9774  . Anemia, unspecified 05/25/2014  . Syncope 04/30/2014  . Atherosclerosis of native arteries of the  extremities with gangrene ([place]) 12/16/2013  . Volume overload 10/30/2013  . Acute renal failure ([place]) 10/28/2013  . PAD (peripheral artery disease) ([place]) 10/27/2013  . Leukocytosis 10/21/2013  . Anemia 10/21/2013  . Osteomyelitis of left foot ([place]) 08/19/2013  . Spinal stenosis, lumbar region, with neurogenic claudication 06/12/2012    Class: Diagnosis of  . DIABETES MELLITUS, TYPE II 03/15/2009  . ALCOHOL ABUSE 03/15/2009  . TOBACCO USE 03/15/2009  . HYPERTENSION 03/15/2009    Jamey Reas, PT, DPT 01/18/2021, 9:43 AM  [facility] [address] [place], [place], [address] Phone: [phone]   Fax:  [phone]  Name: HEYWARD DOUTHIT MRN: [id number] Date of Birth: November 24, 1960

## 2021-01-19 DIAGNOSIS — I495 Sick sinus syndrome: Secondary | ICD-10-CM | POA: Diagnosis not present

## 2021-01-19 DIAGNOSIS — Z950 Presence of cardiac pacemaker: Secondary | ICD-10-CM | POA: Diagnosis not present

## 2021-01-19 DIAGNOSIS — D689 Coagulation defect, unspecified: Secondary | ICD-10-CM | POA: Diagnosis not present

## 2021-01-19 DIAGNOSIS — D631 Anemia in chronic kidney disease: Secondary | ICD-10-CM | POA: Diagnosis not present

## 2021-01-19 DIAGNOSIS — T8249XA Other complication of vascular dialysis catheter, initial encounter: Secondary | ICD-10-CM | POA: Diagnosis not present

## 2021-01-19 DIAGNOSIS — E0821 Diabetes mellitus due to underlying condition with diabetic nephropathy: Secondary | ICD-10-CM | POA: Diagnosis not present

## 2021-01-19 DIAGNOSIS — Z992 Dependence on renal dialysis: Secondary | ICD-10-CM | POA: Diagnosis not present

## 2021-01-19 DIAGNOSIS — N2581 Secondary hyperparathyroidism of renal origin: Secondary | ICD-10-CM | POA: Diagnosis not present

## 2021-01-19 DIAGNOSIS — Z45018 Encounter for adjustment and management of other part of cardiac pacemaker: Secondary | ICD-10-CM | POA: Diagnosis not present

## 2021-01-19 DIAGNOSIS — D509 Iron deficiency anemia, unspecified: Secondary | ICD-10-CM | POA: Diagnosis not present

## 2021-01-19 DIAGNOSIS — N186 End stage renal disease: Secondary | ICD-10-CM | POA: Diagnosis not present

## 2021-01-21 DIAGNOSIS — T8249XA Other complication of vascular dialysis catheter, initial encounter: Secondary | ICD-10-CM | POA: Diagnosis not present

## 2021-01-21 DIAGNOSIS — Z992 Dependence on renal dialysis: Secondary | ICD-10-CM | POA: Diagnosis not present

## 2021-01-21 DIAGNOSIS — D509 Iron deficiency anemia, unspecified: Secondary | ICD-10-CM | POA: Diagnosis not present

## 2021-01-21 DIAGNOSIS — E0821 Diabetes mellitus due to underlying condition with diabetic nephropathy: Secondary | ICD-10-CM | POA: Diagnosis not present

## 2021-01-21 DIAGNOSIS — D631 Anemia in chronic kidney disease: Secondary | ICD-10-CM | POA: Diagnosis not present

## 2021-01-21 DIAGNOSIS — N186 End stage renal disease: Secondary | ICD-10-CM | POA: Diagnosis not present

## 2021-01-21 DIAGNOSIS — N2581 Secondary hyperparathyroidism of renal origin: Secondary | ICD-10-CM | POA: Diagnosis not present

## 2021-01-21 DIAGNOSIS — D689 Coagulation defect, unspecified: Secondary | ICD-10-CM | POA: Diagnosis not present

## 2021-01-23 ENCOUNTER — Encounter: Payer: Self-pay | Admitting: Physical Therapy

## 2021-01-23 ENCOUNTER — Other Ambulatory Visit: Payer: Self-pay

## 2021-01-23 ENCOUNTER — Ambulatory Visit (INDEPENDENT_AMBULATORY_CARE_PROVIDER_SITE_OTHER): Payer: Medicare Other | Admitting: Physical Therapy

## 2021-01-23 DIAGNOSIS — R293 Abnormal posture: Secondary | ICD-10-CM | POA: Diagnosis not present

## 2021-01-23 DIAGNOSIS — R2689 Other abnormalities of gait and mobility: Secondary | ICD-10-CM | POA: Diagnosis not present

## 2021-01-23 DIAGNOSIS — R2681 Unsteadiness on feet: Secondary | ICD-10-CM | POA: Diagnosis not present

## 2021-01-23 DIAGNOSIS — M21372 Foot drop, left foot: Secondary | ICD-10-CM

## 2021-01-23 NOTE — Therapy (Signed)
[facility] [address] [place], [place], [address] Phone: [phone]   Fax:  [phone]  Physical Therapy Treatment  Patient Details  Name: Alexander [name] MRN: [id number] Date of Birth: 03-06-1961 Referring Provider (PT): Meridee Score, MD   Encounter Date: 01/23/2021   PT End of Session - 01/23/21 0849    Visit Number 6    Number of Visits 25    Date for PT Re-Evaluation 04/04/21    Authorization Type BCBS Medicare    Authorization Time Period $40 co-pay    PT Start Time 0845    PT Stop Time 0930    PT Time Calculation (min) 45 min    Equipment Utilized During Treatment Gait belt    Activity Tolerance Patient tolerated treatment well    Behavior During Therapy [facility] for tasks assessed/performed           Past Medical History:  Diagnosis Date  . Alcohol abuse   . Anemia   . Arthritis    "patient does not think so."  . Atrial fibrillation ([place])   . Cardiac syncope 10/07/14   rapid A fib with 8 sec pauses on converison with syncope- temp pacing wire placed then PPM  . Cataract    BILATERAL  . Chronic kidney disease   . Coronary artery disease   . Diabetes mellitus    dx---been  awhile  . Encounter for care of pacemaker 10/08/2014  . History of blood transfusion   . Hyperlipemia   . Hypertension   . Osteomyelitis ([place])    right foot  . Peripheral vascular disease ([place])   . Presence of permanent cardiac pacemaker 10/08/2014  . Sinus node dysfunction ([place]) 10/08/2014    Past Surgical History:  Procedure Laterality Date  . ABDOMINAL AORTOGRAM W/LOWER EXTREMITY N/A 09/09/2017   Procedure: ABDOMINAL AORTOGRAM W/LOWER EXTREMITY;  Surgeon: Angelia Mould, MD;  Location: [place] [facility];  Service: Cardiovascular;  Laterality: N/A;  . ABDOMINAL AORTOGRAM W/LOWER EXTREMITY Right 11/11/2019   Procedure: ABDOMINAL AORTOGRAM W/LOWER EXTREMITY;  Surgeon: Marty Heck, MD;  Location: [place] [facility];  Service:  Cardiovascular;  Laterality: Right;  . AMPUTATION Left 08/19/2013   Procedure: AMPUTATION RAY;  Surgeon: Alta Corning, MD;  Location: [place];  Service: Orthopedics;  Laterality: Left;  ray amputation left 5th  . AMPUTATION Left 10/27/2013   Procedure: AMPUTATION DIGIT-LEFT 4TH TOE, 4th and 5th metatarsal.;  Surgeon: Angelia Mould, MD;  Location: [place];  Service: Vascular;  Laterality: Left;  . AMPUTATION Left 11/04/2013   Procedure: LEFT FOOT TRANS-METATARSAL AMPUTATION WITH WOUND CLOSURE ;  Surgeon: Alta Corning, MD;  Location: [place];  Service: Orthopedics;  Laterality: Left;  . AMPUTATION Right 09/10/2017   Procedure: RIGHT FOURTH AND FIFTH TOE AMPUTATION;  Surgeon: Angelia Mould, MD;  Location: [employer];  Service: Vascular;  Laterality: Right;  . AMPUTATION Right 02/19/2020   Procedure: AMPUTATION RIGHT 3RD TOE;  Surgeon: Angelia Mould, MD;  Location: [place];  Service: Vascular;  Laterality: Right;  . AMPUTATION Right 03/11/2020   Procedure: RIGHT BELOW KNEE AMPUTATION;  Surgeon: Newt Minion, MD;  Location: [place];  Service: Orthopedics;  Laterality: Right;  . APPLICATION OF WOUND VAC Right 02/19/2020   Procedure: Application Of Wound Vac Right foot;  Surgeon: Angelia Mould, MD;  Location: [place];  Service: Vascular;  Laterality: Right;  . BIOPSY  07/20/2020   Procedure: BIOPSY;  Surgeon: Rush Landmark Telford Nab., MD;  Location:  [place] [facility];  Service: Gastroenterology;;  . CARDIAC CATHETERIZATION  10/07/2014   Procedure: LEFT HEART CATH AND CORONARY ANGIOGRAPHY;  Surgeon: Laverda Page, MD;  Location: [facility];  Service: Cardiovascular;;  . COLONOSCOPY W/ POLYPECTOMY    . COLONOSCOPY WITH PROPOFOL N/A 05/25/2020   Procedure: COLONOSCOPY WITH PROPOFOL;  Surgeon: Gatha Mayer, MD;  Location: [facility];  Service: Endoscopy;  Laterality: N/A;  . EMBOLECTOMY Right 09/12/2017   Procedure: Thrombectomy  & Redo Right Below Knee Popliteal Artery Bypass Graft  ;   Surgeon: Waynetta Sandy, MD;  Location: [place];  Service: Vascular;  Laterality: Right;  . ENTEROSCOPY N/A 07/20/2020   Procedure: ENTEROSCOPY;  Surgeon: Mansouraty, Telford Nab., MD;  Location: [place];  Service: Gastroenterology;  Laterality: N/A;  . ESOPHAGOGASTRODUODENOSCOPY (EGD) WITH PROPOFOL N/A 05/24/2020   Procedure: ESOPHAGOGASTRODUODENOSCOPY (EGD) WITH PROPOFOL;  Surgeon: Ladene Artist, MD;  Location: [facility];  Service: Endoscopy;  Laterality: N/A;  . EYE SURGERY Bilateral    cataract  . FEMORAL-TIBIAL BYPASS GRAFT Left 10/27/2013   Procedure: BYPASS GRAFT LEFT FEMORAL- POSTERIOR TIBIAL ARTERY;  Surgeon: Angelia Mould, MD;  Location: [place];  Service: Vascular;  Laterality: Left;  . FEMORAL-TIBIAL BYPASS GRAFT Right 09/10/2017   Procedure: RIGHT SUPERFICIAL  FEMORAL ARTERY-BELOW KNEE POPLITEAL ARTERY BYPASS GRAFT WITH VEIN;  Surgeon: Angelia Mould, MD;  Location: [place];  Service: Vascular;  Laterality: Right;  . GIVENS CAPSULE STUDY N/A 07/20/2020   Procedure: GIVENS CAPSULE STUDY;  Surgeon: Irving Copas., MD;  Location: [place];  Service: Gastroenterology;  Laterality: N/A;  capsule placed through scope into duodenum @ 0936  . I & D EXTREMITY Left 10/27/2013   Procedure: IRRIGATION AND DEBRIDEMENT EXTREMITY- LEFT FOOT;  Surgeon: Angelia Mould, MD;  Location: [place];  Service: Vascular;  Laterality: Left;  . I & D EXTREMITY Right 01/26/2020   Procedure: IRRIGATION AND DEBRIDEMENT EXTREMITY RIGHT FOOT WOUND;  Surgeon: Angelia Mould, MD;  Location: [place];  Service: Vascular;  Laterality: Right;  . INTRAOPERATIVE ARTERIOGRAM Right 09/10/2017   Procedure: INTRA OPERATIVE ARTERIOGRAM;  Surgeon: Angelia Mould, MD;  Location: [place];  Service: Vascular;  Laterality: Right;  . IR ANGIOGRAM FOLLOW UP STUDY  09/09/2017  . IR FLUORO GUIDE CV LINE RIGHT  05/31/2020  . IR US GUIDE VASC ACCESS RIGHT  06/01/2020  . LEFT HEART CATH  AND CORONARY ANGIOGRAPHY N/A 08/19/2020   Procedure: LEFT HEART CATH AND CORONARY ANGIOGRAPHY;  Surgeon: Adrian Prows, MD;  Location: [place] [facility];  Service: Cardiovascular;  Laterality: N/A;  . LEFT HEART CATHETERIZATION WITH CORONARY ANGIOGRAM N/A 11/09/2014   Procedure: LEFT HEART CATHETERIZATION WITH CORONARY ANGIOGRAM;  Surgeon: Laverda Page, MD;  Location: [facility] CATH LAB;  Service: Cardiovascular;  Laterality: N/A;  . LOWER EXTREMITY ANGIOGRAM Right 11/08/2015   Procedure: Lower Extremity Angiogram;  Surgeon: Serafina Mitchell, MD;  Location: [place] [facility];  Service: Cardiovascular;  Laterality: Right;  . LUMBAR LAMINECTOMY  06/13/2012   Procedure: MICRODISCECTOMY LUMBAR LAMINECTOMY;  Surgeon: Jessy Oto, MD;  Location: [place];  Service: Orthopedics;  Laterality: N/A;  Central laminectomy L2-3, L3-4, L4-5  . PACEMAKER INSERTION  10/08/14   MDT [employer]a MRI compatible dual chamber pacemaker implanted by Dr Caryl Comes for syncope with post-termination pauses  . PERCUTANEOUS CORONARY STENT INTERVENTION (PCI-S)  11/09/2014   des to lad & distal circumflex         Dr  [name]  . PERIPHERAL VASCULAR  BALLOON ANGIOPLASTY Right 11/11/2019   Procedure: PERIPHERAL VASCULAR BALLOON ANGIOPLASTY;  Surgeon: Marty Heck, MD;  Location: [place] [facility];  Service: Cardiovascular;  Laterality: Right;  below knee popliteal, tibioperoneal trunk, posterior tibial arteries  . PERIPHERAL VASCULAR CATHETERIZATION N/A 11/08/2015   Procedure: Abdominal Aortogram;  Surgeon: Serafina Mitchell, MD;  Location: [place] [facility];  Service: Cardiovascular;  Laterality: N/A;  . PERIPHERAL VASCULAR CATHETERIZATION Right 11/08/2015   Procedure: Peripheral Vascular Atherectomy;  Surgeon: Serafina Mitchell, MD;  Location: [place] [facility];  Service: Cardiovascular;  Laterality: Right;  . PERIPHERAL VASCULAR CATHETERIZATION N/A 10/10/2016   Procedure: Lower Extremity Angiography;  Surgeon: Waynetta Sandy, MD;   Location: [place] [facility];  Service: Cardiovascular;  Laterality: N/A;  . PERIPHERAL VASCULAR CATHETERIZATION Right 10/10/2016   Procedure: Peripheral Vascular Atherectomy;  Surgeon: Waynetta Sandy, MD;  Location: [place] [facility];  Service: Cardiovascular;  Laterality: Right;  Popliteal  . PERMANENT PACEMAKER INSERTION N/A 10/08/2014   Procedure: PERMANENT PACEMAKER INSERTION;  Surgeon: Deboraha Sprang, MD;  Location: [facility] LAB;  Service: Cardiovascular;  Laterality: N/A;  . SUBMUCOSAL TATTOO INJECTION  07/20/2020   Procedure: SUBMUCOSAL TATTOO INJECTION;  Surgeon: Irving Copas., MD;  Location: [place];  Service: Gastroenterology;;  . TEMPORARY PACEMAKER INSERTION Bilateral 10/07/2014   Procedure: TEMPORARY PACEMAKER INSERTION;  Surgeon: Laverda Page, MD;  Location: [facility] CATH LAB;  Service: Cardiovascular;  Laterality: Bilateral;  . WOUND DEBRIDEMENT Right 02/19/2020   Procedure: DEBRIDEMENT WOUND RIGHT FOOT;  Surgeon: Angelia Mould, MD;  Location: [place];  Service: Vascular;  Laterality: Right;    There were no vitals filed for this visit.   Subjective Assessment - 01/23/21 0845    Subjective He is wearing prosthesis most of awake hours except 2 hrs midday on nondialysis days and not to dialysis yet but same after dialysis.    Pertinent History Rt TTA, artthritis, A-Fib, cardiac syncope, cataract, CKD, CAD, DM, ESRD w/HD since 7/21, HTN, PVD, cardiac pacemaker,    Limitations Walking;House hold activities    Patient Stated Goals to use prosthesis to walk in community, to hunt & fish, yardwork & garden    Currently in Pain? No/denies                             [facility] Adult PT Treatment/Exercise - 01/23/21 0845      Transfers   Transfers Sit to Stand;Stand to Sit    Sit to Stand 5: Supervision;With upper extremity assist;With armrests;From chair/3-in-1;Other (comment)   requires RW support for stabilization   Stand to Sit 5:  Supervision;With upper extremity assist;With armrests;To chair/3-in-1;Other (comment)   requires RW for stabilization     Ambulation/Gait   Ambulation/Gait Yes    Ambulation/Gait Assistance 5: Supervision    Ambulation/Gait Assistance Details cues on upright posture looking forward & only glancing at ground.    Ambulation Distance (Feet) 100 Feet    Assistive device Rolling walker;Prosthesis    Ambulation Surface Level;Indoor    Ramp 4: Min assist   RW & prosthesis   Curb 4: Min assist   min guard, RW & prosthesis     High Level Balance   High Level Balance Activities Side stepping;Turns;Backward walking   //bars   High Level Balance Comments PT demo & verbal cues on technique      Neuro Re-ed    Neuro Re-ed Details  see pt instructions  for exercises for posture & balance      Exercises   Exercises Knee/Hip      Knee/Hip Exercises: Standing   Forward Step Up Right;Left;1 set;10 reps;Hand Hold: 2;Step Height: 6"    Forward Step Up Limitations demo & verbal cues on technique    Step Down Right;Left;1 set;10 reps;Hand Hold: 2;Step Height: 6"    Rocker Board 1 minute   ant/mid/post & right/mid/left   Rocker Board Limitations mirror for visual upright, verbal cues on control      Knee/Hip Exercises: Seated   Sit to Sand 1 set;10 reps;without UE support   from 24" bar stool with goal to not touch //bars to stabilize, required intermittent touch & minA from PT to stabilize     Prosthetics   Prosthetic Care Comments  increase wear to all awake hours drying prn or q4-5 hrs immediately redonning prosthesis. PT instructed in technique for donning long nonstretch pants. Once prosthesis is weighed then he should wear it to dialysis. begin walking in home (if tired after dialysis only walk with assistance) with RW and not sitting in w/c    Current prosthetic wear tolerance (days/week)  daily    Current prosthetic wear tolerance (#hours/day)  nondialysis days & after dialysis all awake hours  except 2 hrs midday    Current prosthetic weight-bearing tolerance (hours/day)  Patient tolerated standing & gait with partial weight on prosthesis for 5 minutes with no c/o limb pain or discomfort.    Edema Pitting edema with 5-7 sec capillary refill    Residual limb condition  No open areas, normal color, tempertaure & moisture, shape cylinderical    Education Provided Correct ply sock adjustment;Proper Donning;Proper wear schedule/adjustment;Other (comment)   see prosthetic care comments   Person(s) Educated Patient    Education Method Explanation;Demonstration;Tactile cues;Verbal cues    Education Method Verbalized understanding;Tactile cues required;Verbal cues required;Needs further instruction    Donning Prosthesis Modified independent (device/increased time)               Balance Exercises - 01/23/21 0845      Balance Exercises: Standing   Standing Eyes Opened Wide ([place]);Solid surface;Head turns;5 reps    Standing Eyes Opened Limitations minA/tactile cues on balance reactions with head turns up/down & rt/lt    Standing Eyes Closed Wide (BOA);Solid surface    Standing Eyes Closed Limitations minA/tactile cues on balance reactions               PT Short Term Goals - 01/18/21 0941      PT SHORT TERM GOAL #1   Title Patteint donnes prosthesis correctly and verbalizes proper cleaning.    Time 1    Period Months    Status On-going    Target Date 02/01/21      PT SHORT TERM GOAL #2   Title Pateint tolerates prosthesis wear >10 hours total / day with skin issues.    Time 1    Period Months    Status On-going    Target Date 02/01/21      PT SHORT TERM GOAL #3   Title Pateint is able to reach 10" anteriorly & pick up item from floor with rolling walker safely.    Time 1    Period Months    Status On-going    Target Date 02/01/21      PT SHORT TERM GOAL #4   Title Pateint ambulates 200' with RW & prosthesis with supervision.    Time 1  Period Months    Status  On-going    Target Date 02/01/21      PT SHORT TERM GOAL #5   Title Patient negotiates ramps & curbs with RW & prosthesis with minA.    Time 1    Period Months    Status On-going    Target Date 02/01/21             PT Long Term Goals - 01/18/21 0941      PT LONG TERM GOAL #1   Title Patient verbalizes & demonstrates understanding of prosthetic care to enable safe utilization of prosthesis.    Time 3    Period Months    Status On-going    Target Date 04/03/21      PT LONG TERM GOAL #2   Title Patient tolerates wear of prosthesis >90% of awake hours without skin or limb pain issues to enable function throughout his day.    Time 3    Period Months    Status On-going    Target Date 04/03/21      PT LONG TERM GOAL #3   Title Task of [place] Balance with RW  >/= 45/56    Time 3    Period Months    Status On-going    Target Date 04/03/21      PT LONG TERM GOAL #4   Title Patient ambulates >400' with LRAD & prosthesis modified independent.    Time 3    Period Months    Status On-going    Target Date 04/03/21      PT LONG TERM GOAL #5   Title Patient negotiates ramps, curbs & stairs with LRAD & prosthesis modified independent.    Time 3    Period Months    Status On-going    Target Date 04/03/21                 Plan - 01/23/21 0850    Clinical Impression Statement Patient reported leg fatigue with standing & gait activities. PT has been able to progress activities. His wear time has improved and appears ready to wear all awake hours with intermittent drying.    Personal Factors and Comorbidities Comorbidity 3+;Fitness;Time since onset of injury/illness/exacerbation;Past/Current Experience    Comorbidities Rt TTA, left Transmetatarsal Amputation, artthritis, A-Fib, cardiac syncope, cataract, CKD, CAD, DM, ESRD w/HD since 7/21, HTN, PVD, cardiac pacemaker,    Examination-Activity Limitations Locomotion Level;Squat;Stairs;Stand;Toileting;Transfers     Examination-Participation Restrictions Community Activity    Stability/Clinical Decision Making Evolving/Moderate complexity    Rehab Potential Good    PT Frequency 2x / week    PT Duration 12 weeks    PT Treatment/Interventions ADLs/Self Care Home Management;DME Instruction;Gait training;Stair training;Functional mobility training;Therapeutic activities;Therapeutic exercise;Balance training;Neuromuscular re-education;Patient/family education;Prosthetic Training;Vestibular    PT Next Visit Plan review prosthetic care, prosthetic gait including ramps & curbs. standing balance & strength    Consulted and Agree with Plan of Care Patient           Patient will benefit from skilled therapeutic intervention in order to improve the following deficits and impairments:  Abnormal gait,Cardiopulmonary status limiting activity,Decreased activity tolerance,Decreased balance,Decreased endurance,Decreased knowledge of use of DME,Decreased mobility,Decreased scar mobility,Decreased strength,Increased edema,Impaired flexibility,Postural dysfunction,Prosthetic Dependency,Obesity  Visit Diagnosis: Other abnormalities of gait and mobility  Unsteadiness on feet  Abnormal posture  Foot drop, left     Problem List Patient Active Problem List   Diagnosis Date Noted  . NSVT (nonsustained ventricular tachycardia) ([place])   . Pancytopenia (  [place])   . Acute blood loss anemia 07/18/2020  . Hemodialysis patient ([place]) 06/04/2020  . Necrotizing glomerulonephritis 06/04/2020  . Heme positive stool   . Melena   . Upper GI bleeding 05/23/2020  . Acute GI bleeding 05/23/2020  . Acute hematogenous osteomyelitis of right foot ([place]) 03/11/2020  . Gangrene of right foot ([place])   . Diabetic foot ulcer associated with type 2 diabetes mellitus ([place]) 02/19/2020  . Heel ulcer ([place]) 11/05/2019  . Atherosclerotic PVD with ulceration ([place]) 09/09/2017  . Essential hypertension, benign 02/07/2015  . Renal insufficiency  02/07/2015  . Post PTCA 11/09/2014  . S/P PTCA (percutaneous transluminal coronary angioplasty) 11/09/2014  . Pacemaker: [place] DR MRI [id number] Dual chamber pacemaker 10/08/2014 10/08/2014  . Encounter for care of pacemaker 10/08/2014  . Sinus node dysfunction ([place]) 10/08/2014  . Cardiac asystole ([place]) 10/07/2014  . Cardiac syncope 10/07/2014  . Diabetes mellitus with stage 3 chronic kidney disease ([place]) 10/07/2014  . CAD (coronary artery disease), native coronary artery 10/07/2014  . Diabetes mellitus due to underlying condition without complications ([place]) 83/15/1761  . Anemia, unspecified 05/25/2014  . Syncope 04/30/2014  . Atherosclerosis of native arteries of the extremities with gangrene ([place]) 12/16/2013  . Volume overload 10/30/2013  . Acute renal failure ([place]) 10/28/2013  . PAD (peripheral artery disease) ([place]) 10/27/2013  . Leukocytosis 10/21/2013  . Anemia 10/21/2013  . Osteomyelitis of left foot ([place]) 08/19/2013  . Spinal stenosis, lumbar region, with neurogenic claudication 06/12/2012    Class: Diagnosis of  . DIABETES MELLITUS, TYPE II 03/15/2009  . ALCOHOL ABUSE 03/15/2009  . TOBACCO USE 03/15/2009  . HYPERTENSION 03/15/2009    Jamey Reas, PT, DPT 01/23/2021, 1:00 PM  [facility] [address] [place], [place], [address] Phone: [phone]   Fax:  [phone]  Name: COURTEZ TWADDLE MRN: [id number] Date of Birth: [birth year]

## 2021-01-24 DIAGNOSIS — N186 End stage renal disease: Secondary | ICD-10-CM | POA: Diagnosis not present

## 2021-01-24 DIAGNOSIS — T8249XA Other complication of vascular dialysis catheter, initial encounter: Secondary | ICD-10-CM | POA: Diagnosis not present

## 2021-01-24 DIAGNOSIS — E0821 Diabetes mellitus due to underlying condition with diabetic nephropathy: Secondary | ICD-10-CM | POA: Diagnosis not present

## 2021-01-24 DIAGNOSIS — D689 Coagulation defect, unspecified: Secondary | ICD-10-CM | POA: Diagnosis not present

## 2021-01-24 DIAGNOSIS — D509 Iron deficiency anemia, unspecified: Secondary | ICD-10-CM | POA: Diagnosis not present

## 2021-01-24 DIAGNOSIS — Z992 Dependence on renal dialysis: Secondary | ICD-10-CM | POA: Diagnosis not present

## 2021-01-24 DIAGNOSIS — N2581 Secondary hyperparathyroidism of renal origin: Secondary | ICD-10-CM | POA: Diagnosis not present

## 2021-01-25 ENCOUNTER — Other Ambulatory Visit: Payer: Self-pay

## 2021-01-25 ENCOUNTER — Encounter: Payer: Self-pay | Admitting: Physical Therapy

## 2021-01-25 ENCOUNTER — Ambulatory Visit (INDEPENDENT_AMBULATORY_CARE_PROVIDER_SITE_OTHER): Payer: Medicare Other | Admitting: Physical Therapy

## 2021-01-25 DIAGNOSIS — R2689 Other abnormalities of gait and mobility: Secondary | ICD-10-CM | POA: Diagnosis not present

## 2021-01-25 DIAGNOSIS — R293 Abnormal posture: Secondary | ICD-10-CM

## 2021-01-25 DIAGNOSIS — R2681 Unsteadiness on feet: Secondary | ICD-10-CM | POA: Diagnosis not present

## 2021-01-25 DIAGNOSIS — M21372 Foot drop, left foot: Secondary | ICD-10-CM | POA: Diagnosis not present

## 2021-01-25 NOTE — Therapy (Signed)
[facility] Physical Therapy [address] [place], [place], [address] Phone: [phone]   Fax:  [phone]  Physical Therapy Treatment  Patient Details  Name: [place] [name] MRN: [id number] Date of Birth: 1961/05/13 Referring Provider (PT): Meridee Score, MD   Encounter Date: 01/25/2021   PT End of Session - 01/25/21 0857    Visit Number 7    Number of Visits 25    Date for PT Re-Evaluation 04/04/21    Authorization Type BCBS Medicare    Authorization Time Period $40 co-pay    PT Start Time 0845    PT Stop Time 0928    PT Time Calculation (min) 43 min    Equipment Utilized During Treatment Gait belt    Activity Tolerance Patient tolerated treatment well    Behavior During Therapy [facility] for tasks assessed/performed           Past Medical History:  Diagnosis Date  . Alcohol abuse   . Anemia   . Arthritis    "patient does not think so."  . Atrial fibrillation ([place])   . Cardiac syncope 10/07/14   rapid A fib with 8 sec pauses on converison with syncope- temp pacing wire placed then PPM  . Cataract    BILATERAL  . Chronic kidney disease   . Coronary artery disease   . Diabetes mellitus    dx---been  awhile  . Encounter for care of pacemaker 10/08/2014  . History of blood transfusion   . Hyperlipemia   . Hypertension   . Osteomyelitis ([place])    right foot  . Peripheral vascular disease ([place])   . Presence of permanent cardiac pacemaker 10/08/2014  . Sinus node dysfunction ([place]) 10/08/2014    Past Surgical History:  Procedure Laterality Date  . ABDOMINAL AORTOGRAM W/LOWER EXTREMITY N/A 09/09/2017   Procedure: ABDOMINAL AORTOGRAM W/LOWER EXTREMITY;  Surgeon: Angelia Mould, MD;  Location: [place] CV [facility];  Service: Cardiovascular;  Laterality: N/A;  . ABDOMINAL AORTOGRAM W/LOWER EXTREMITY Right 11/11/2019   Procedure: ABDOMINAL AORTOGRAM W/LOWER EXTREMITY;  Surgeon: Marty Heck, MD;  Location: [place] [facility];  Service:  Cardiovascular;  Laterality: Right;  . AMPUTATION Left 08/19/2013   Procedure: AMPUTATION RAY;  Surgeon: Alta Corning, MD;  Location: [place];  Service: Orthopedics;  Laterality: Left;  ray amputation left 5th  . AMPUTATION Left 10/27/2013   Procedure: AMPUTATION DIGIT-LEFT 4TH TOE, 4th and 5th metatarsal.;  Surgeon: Angelia Mould, MD;  Location: [place];  Service: Vascular;  Laterality: Left;  . AMPUTATION Left 11/04/2013   Procedure: LEFT FOOT TRANS-METATARSAL AMPUTATION WITH WOUND CLOSURE ;  Surgeon: Alta Corning, MD;  Location: [place];  Service: Orthopedics;  Laterality: Left;  . AMPUTATION Right 09/10/2017   Procedure: RIGHT FOURTH AND FIFTH TOE AMPUTATION;  Surgeon: Angelia Mould, MD;  Location: [place];  Service: Vascular;  Laterality: Right;  . AMPUTATION Right 02/19/2020   Procedure: AMPUTATION RIGHT 3RD TOE;  Surgeon: Angelia Mould, MD;  Location: [place];  Service: Vascular;  Laterality: Right;  . AMPUTATION Right 03/11/2020   Procedure: RIGHT BELOW KNEE AMPUTATION;  Surgeon: Newt Minion, MD;  Location: [place];  Service: Orthopedics;  Laterality: Right;  . APPLICATION OF WOUND VAC Right 02/19/2020   Procedure: Application Of Wound Vac Right foot;  Surgeon: Angelia Mould, MD;  Location: [place];  Service: Vascular;  Laterality: Right;  . BIOPSY  07/20/2020   Procedure: BIOPSY;  Surgeon: Rush Landmark Telford Nab., MD;  Location:  [place] [facility];  Service: Gastroenterology;;  . CARDIAC CATHETERIZATION  10/07/2014   Procedure: LEFT HEART CATH AND CORONARY ANGIOGRAPHY;  Surgeon: Laverda Page, MD;  Location: [facility] C[facility];  Service: Cardiovascular;;  . COLONOSCOPY W/ POLYPECTOMY    . COLONOSCOPY WITH PROPOFOL N/A 05/25/2020   Procedure: COLONOSCOPY WITH PROPOFOL;  Surgeon: Gatha Mayer, MD;  Location: [facility];  Service: Endoscopy;  Laterality: N/A;  . EMBOLECTOMY Right 09/12/2017   Procedure: Thrombectomy  & Redo Right Below Knee Popliteal Artery Bypass Graft  ;   Surgeon: Waynetta Sandy, MD;  Location: [place];  Service: Vascular;  Laterality: Right;  . ENTEROSCOPY N/A 07/20/2020   Procedure: ENTEROSCOPY;  Surgeon: Mansouraty, Telford Nab., MD;  Location: [place];  Service: Gastroenterology;  Laterality: N/A;  . ESOPHAGOGASTRODUODENOSCOPY (EGD) WITH PROPOFOL N/A 05/24/2020   Procedure: ESOPHAGOGASTRODUODENOSCOPY (EGD) WITH PROPOFOL;  Surgeon: Ladene Artist, MD;  Location: [facility];  Service: Endoscopy;  Laterality: N/A;  . EYE SURGERY Bilateral    cataract  . FEMORAL-TIBIAL BYPASS GRAFT Left 10/27/2013   Procedure: BYPASS GRAFT LEFT FEMORAL- POSTERIOR TIBIAL ARTERY;  Surgeon: Angelia Mould, MD;  Location: [place];  Service: Vascular;  Laterality: Left;  . FEMORAL-TIBIAL BYPASS GRAFT Right 09/10/2017   Procedure: RIGHT SUPERFICIAL  FEMORAL ARTERY-BELOW KNEE POPLITEAL ARTERY BYPASS GRAFT WITH VEIN;  Surgeon: Angelia Mould, MD;  Location: [place];  Service: Vascular;  Laterality: Right;  . GIVENS CAPSULE STUDY N/A 07/20/2020   Procedure: GIVENS CAPSULE STUDY;  Surgeon: Irving Copas., MD;  Location: [place];  Service: Gastroenterology;  Laterality: N/A;  capsule placed through scope into duodenum @ 0936  . I & D EXTREMITY Left 10/27/2013   Procedure: IRRIGATION AND DEBRIDEMENT EXTREMITY- LEFT FOOT;  Surgeon: Angelia Mould, MD;  Location: [place];  Service: Vascular;  Laterality: Left;  . I & D EXTREMITY Right 01/26/2020   Procedure: IRRIGATION AND DEBRIDEMENT EXTREMITY RIGHT FOOT WOUND;  Surgeon: Angelia Mould, MD;  Location: [place];  Service: Vascular;  Laterality: Right;  . INTRAOPERATIVE ARTERIOGRAM Right 09/10/2017   Procedure: INTRA OPERATIVE ARTERIOGRAM;  Surgeon: Angelia Mould, MD;  Location: [place];  Service: Vascular;  Laterality: Right;  . IR ANGIOGRAM FOLLOW UP STUDY  09/09/2017  . IR FLUORO GUIDE CV LINE RIGHT  05/31/2020  . IR US GUIDE VASC ACCESS RIGHT  06/01/2020  . LEFT HEART CATH  AND CORONARY ANGIOGRAPHY N/A 08/19/2020   Procedure: LEFT HEART CATH AND CORONARY ANGIOGRAPHY;  Surgeon: Adrian Prows, MD;  Location: [place] CV [facility];  Service: Cardiovascular;  Laterality: N/A;  . LEFT HEART CATHETERIZATION WITH CORONARY ANGIOGRAM N/A 11/09/2014   Procedure: LEFT HEART CATHETERIZATION WITH CORONARY ANGIOGRAM;  Surgeon: Laverda Page, MD;  Location: [facility] CATH LAB;  Service: Cardiovascular;  Laterality: N/A;  . LOWER EXTREMITY ANGIOGRAM Right 11/08/2015   Procedure: Lower Extremity Angiogram;  Surgeon: Serafina Mitchell, MD;  Location: [place] CV [facility];  Service: Cardiovascular;  Laterality: Right;  . LUMBAR LAMINECTOMY  06/13/2012   Procedure: MICRODISCECTOMY LUMBAR LAMINECTOMY;  Surgeon: Jessy Oto, MD;  Location: [place];  Service: Orthopedics;  Laterality: N/A;  Central laminectomy L2-3, L3-4, L4-5  . PACEMAKER INSERTION  10/08/14   MDT Advisa MRI compatible dual chamber pacemaker implanted by Dr Caryl Comes for syncope with post-termination pauses  . PERCUTANEOUS CORONARY STENT INTERVENTION (PCI-S)  11/09/2014   des to lad & distal circumflex         Dr  [name]  . PERIPHERAL VASCULAR  BALLOON ANGIOPLASTY Right 11/11/2019   Procedure: PERIPHERAL VASCULAR BALLOON ANGIOPLASTY;  Surgeon: Marty Heck, MD;  Location: [place] [facility];  Service: Cardiovascular;  Laterality: Right;  below knee popliteal, tibioperoneal trunk, posterior tibial arteries  . PERIPHERAL VASCULAR CATHETERIZATION N/A 11/08/2015   Procedure: Abdominal Aortogram;  Surgeon: Serafina Mitchell, MD;  Location: [place] [facility];  Service: Cardiovascular;  Laterality: N/A;  . PERIPHERAL VASCULAR CATHETERIZATION Right 11/08/2015   Procedure: Peripheral Vascular Atherectomy;  Surgeon: Serafina Mitchell, MD;  Location: [place] [facility];  Service: Cardiovascular;  Laterality: Right;  . PERIPHERAL VASCULAR CATHETERIZATION N/A 10/10/2016   Procedure: Lower Extremity Angiography;  Surgeon: Waynetta Sandy, MD;   Location: [place] CV [facility];  Service: Cardiovascular;  Laterality: N/A;  . PERIPHERAL VASCULAR CATHETERIZATION Right 10/10/2016   Procedure: Peripheral Vascular Atherectomy;  Surgeon: Waynetta Sandy, MD;  Location: [place] [facility];  Service: Cardiovascular;  Laterality: Right;  Popliteal  . PERMANENT PACEMAKER INSERTION N/A 10/08/2014   Procedure: PERMANENT PACEMAKER INSERTION;  Surgeon: Deboraha Sprang, MD;  Location: [facility] CATH LAB;  Service: Cardiovascular;  Laterality: N/A;  . SUBMUCOSAL TATTOO INJECTION  07/20/2020   Procedure: SUBMUCOSAL TATTOO INJECTION;  Surgeon: Irving Copas., MD;  Location: [place];  Service: Gastroenterology;;  . TEMPORARY PACEMAKER INSERTION Bilateral 10/07/2014   Procedure: TEMPORARY PACEMAKER INSERTION;  Surgeon: Laverda Page, MD;  [facility]: [facility] C[facility];  Service: Cardiovascular;  Laterality: Bilateral;  . WOUND DEBRIDEMENT Right 02/19/2020   Procedure: DEBRIDEMENT WOUND RIGHT FOOT;  Surgeon: Angelia Mould, MD;  Location: [place];  Service: Vascular;  Laterality: Right;    There were no vitals filed for this visit.   Subjective Assessment - 01/25/21 0845    Subjective He had prosthesis weighed at dialysis which was 2.5kg.  He has not worn it to dialysis yet.    Pertinent History Rt TTA, artthritis, A-Fib, cardiac syncope, cataract, CKD, CAD, DM, ESRD w/HD since 7/21, HTN, PVD, cardiac pacemaker,    Limitations Walking;House hold activities    Patient Stated Goals to use prosthesis to walk in community, to hunt & fish, yardwork & garden    Currently in Pain? No/denies                             [facility] Adult PT Treatment/Exercise - 01/25/21 0845      Transfers   Transfers Sit to Stand;Stand to Sit    Sit to Stand 5: Supervision;With upper extremity assist;With armrests;From chair/3-in-1;Other (comment)   requires RW support for stabilization   Stand to Sit 5: Supervision;With upper extremity assist;With  armrests;To chair/3-in-1;Other (comment)   requires RW for stabilization     Ambulation/Gait   Ambulation/Gait --    Ambulation/Gait Assistance --    Ambulation Distance (Feet) --    Assistive device --    Ramp --    Curb --      High Level Balance   High Level Balance Activities --   //bars   High Level Balance Comments --      Neuro Re-ed    Neuro Re-ed Details  --      Exercises   Exercises Knee/Hip      Knee/Hip Exercises: Standing   Forward Step Up Right;Left;1 set;10 reps;Hand Hold: 2;Step Height: 6"    Forward Step Up Limitations demo & verbal cues on technique    Step Down Right;Left;1 set;10 reps;Hand Hold: 2;Step Height: 6"  Rocker Board 1 minute   square board, ant/mid/post & right/mid/left   Rocker Board Limitations mirror for visual upright, verbal cues on control      Knee/Hip Exercises: Seated   Sit to Sand 1 set;10 reps;without UE support   from 24" bar stool with goal to not touch //bars to stabilize, required intermittent touch & minA from PT to stabilize     Prosthetics   Prosthetic Care Comments  increase wear to all awake hours drying prn or q4-5 hrs immediately redonning prosthesis. PT instructed in technique for donning long nonstretch pants. Once prosthesis is weighed then he should wear it to dialysis. begin walking in home (if tired after dialysis only walk with assistance) with RW and not sitting in w/c    Current prosthetic wear tolerance (days/week)  daily    Current prosthetic wear tolerance (#hours/day)  nondialysis days & after dialysis all awake hours except 2 hrs midday    Current prosthetic weight-bearing tolerance (hours/day)  Patient tolerated standing & gait with partial weight on prosthesis for 5 minutes with no c/o limb pain or discomfort.    Edema Pitting edema with 5-7 sec capillary refill    Residual limb condition  No open areas, normal color, tempertaure & moisture, shape cylinderical    Education Provided Correct ply sock  adjustment;Proper Donning;Proper wear schedule/adjustment;Other (comment)   see prosthetic care comments   Person(s) Educated Patient    Education Method Explanation;Verbal cues    Education Method Verbal cues required;Needs further instruction;Verbalized understanding               Balance Exercises - 01/25/21 0845      Balance Exercises: Standing   Standing Eyes Opened Wide ([place]);Head turns;5 reps;Foam/compliant surface    Standing Eyes Opened Limitations minA/tactile cues on balance reactions with head turns up/down & rt/lt    Standing Eyes Closed Wide (BOA);Solid surface    Standing Eyes Closed Limitations minA/tactile cues on balance reactions    SLS with Vectors Foam/compliant surface;Upper extremity assist 2;5 reps   ea LE 5 reps   SLS with Vectors Limitations tapping 3 cones - lateral, anterior & adduction / medially crossing.    Turning Both;Limitations    Turning Limitations using table & chair back - turning quarter turns CW & CCW 0 verbal & demo cues on pelvic control & picking up feet not pivoting    Sit to Stand Elevated surface;Without upper extremity support   from 24" bar stool   Sit to Stand Limitations goal to not touch external support               PT Short Term Goals - 01/18/21 0941      PT SHORT TERM GOAL #1   Title Patteint donnes prosthesis correctly and verbalizes proper cleaning.    Time 1    Period Months    Status On-going    Target Date 02/01/21      PT SHORT TERM GOAL #2   Title Pateint tolerates prosthesis wear >10 hours total / day with skin issues.    Time 1    Period Months    Status On-going    Target Date 02/01/21      PT SHORT TERM GOAL #3   Title Pateint is able to reach 10" anteriorly & pick up item from floor with rolling walker safely.    Time 1    Period Months    Status On-going    Target Date 02/01/21  PT SHORT TERM GOAL #4   Title Pateint ambulates 200' with RW & prosthesis with supervision.    Time 1     Period Months    Status On-going    Target Date 02/01/21      PT SHORT TERM GOAL #5   Title Patient negotiates ramps & curbs with RW & prosthesis with minA.    Time 1    Period Months    Status On-going    Target Date 02/01/21             PT Long Term Goals - 01/18/21 0941      PT LONG TERM GOAL #1   Title Patient verbalizes & demonstrates understanding of prosthetic care to enable safe utilization of prosthesis.    Time 3    Period Months    Status On-going    Target Date 04/03/21      PT LONG TERM GOAL #2   Title Patient tolerates wear of prosthesis >90% of awake hours without skin or limb pain issues to enable function throughout his day.    Time 3    Period Months    Status On-going    Target Date 04/03/21      PT LONG TERM GOAL #3   Title Task of [place] Balance with RW  >/= 45/56    Time 3    Period Months    Status On-going    Target Date 04/03/21      PT LONG TERM GOAL #4   Title Patient ambulates >400' with LRAD & prosthesis modified independent.    Time 3    Period Months    Status On-going    Target Date 04/03/21      PT LONG TERM GOAL #5   Title Patient negotiates ramps, curbs & stairs with LRAD & prosthesis modified independent.    Time 3    Period Months    Status On-going    Target Date 04/03/21                 Plan - 01/25/21 0857    Clinical Impression Statement PT session focused on standing balance & functional strength.  His balance including reactions are slowly improving.    Personal Factors and Comorbidities Comorbidity 3+;Fitness;Time since onset of injury/illness/exacerbation;Past/Current Experience    Comorbidities Rt TTA, left Transmetatarsal Amputation, artthritis, A-Fib, cardiac syncope, cataract, CKD, CAD, DM, ESRD w/HD since 7/21, HTN, PVD, cardiac pacemaker,    Examination-Activity Limitations Locomotion Level;Squat;Stairs;Stand;Toileting;Transfers    Examination-Participation Restrictions Community Activity     Stability/Clinical Decision Making Evolving/Moderate complexity    Rehab Potential Good    PT Frequency 2x / week    PT Duration 12 weeks    PT Treatment/Interventions ADLs/Self Care Home Management;DME Instruction;Gait training;Stair training;Functional mobility training;Therapeutic activities;Therapeutic exercise;Balance training;Neuromuscular re-education;Patient/family education;Prosthetic Training;Vestibular    PT Next Visit Plan review prosthetic care, prosthetic gait including ramps & curbs. standing balance & strength    Consulted and Agree with Plan of Care Patient           Patient will benefit from skilled therapeutic intervention in order to improve the following deficits and impairments:  Abnormal gait,Cardiopulmonary status limiting activity,Decreased activity tolerance,Decreased balance,Decreased endurance,Decreased knowledge of use of DME,Decreased mobility,Decreased scar mobility,Decreased strength,Increased edema,Impaired flexibility,Postural dysfunction,Prosthetic Dependency,Obesity  Visit Diagnosis: Other abnormalities of gait and mobility  Unsteadiness on feet  Abnormal posture  Foot drop, left     Problem List Patient Active Problem List   Diagnosis Date Noted  .  NSVT (nonsustained ventricular tachycardia) ([place])   . Pancytopenia ([place])   . Acute blood loss anemia 07/18/2020  . Hemodialysis patient ([place]) 06/04/2020  . Necrotizing glomerulonephritis 06/04/2020  . Heme positive stool   . Melena   . Upper GI bleeding 05/23/2020  . Acute GI bleeding 05/23/2020  . Acute hematogenous osteomyelitis of right foot ([place]) 03/11/2020  . Gangrene of right foot ([place])   . Diabetic foot ulcer associated with type 2 diabetes mellitus ([place]) 02/19/2020  . Heel ulcer ([place]) 11/05/2019  . Atherosclerotic PVD with ulceration ([place]) 09/09/2017  . Essential hypertension, benign 02/07/2015  . Renal insufficiency 02/07/2015  . Post PTCA 11/09/2014  . S/P PTCA (percutaneous  transluminal coronary angioplasty) 11/09/2014  . Pacemaker: Kimball DR MRI [id number] Dual chamber pacemaker 10/08/2014 10/08/2014  . Encounter for care of pacemaker 10/08/2014  . Sinus node dysfunction ([place]) 10/08/2014  . Cardiac asystole ([place]) 10/07/2014  . Cardiac syncope 10/07/2014  . Diabetes mellitus with stage 3 chronic kidney disease ([place]) 10/07/2014  . CAD (coronary artery disease), native coronary artery 10/07/2014  . Diabetes mellitus due to underlying condition without complications ([place]) 89/21/1941  . Anemia, unspecified 05/25/2014  . Syncope 04/30/2014  . Atherosclerosis of native arteries of the extremities with gangrene ([place]) 12/16/2013  . Volume overload 10/30/2013  . Acute renal failure ([place]) 10/28/2013  . PAD (peripheral artery disease) ([place]) 10/27/2013  . Leukocytosis 10/21/2013  . Anemia 10/21/2013  . Osteomyelitis of left foot ([place]) 08/19/2013  . Spinal stenosis, lumbar region, with neurogenic claudication 06/12/2012    Class: Diagnosis of  . DIABETES MELLITUS, TYPE II 03/15/2009  . ALCOHOL ABUSE 03/15/2009  . TOBACCO USE 03/15/2009  . HYPERTENSION 03/15/2009    Jamey Reas, PT, DPT 01/25/2021, 1:53 PM  [facility] Physical Therapy [address] [place], [place], [address] Phone: [phone]   Fax:  [phone]  Name: [place] [name] MRN: [id number] Date of Birth: May 25, 1961

## 2021-01-26 DIAGNOSIS — N2581 Secondary hyperparathyroidism of renal origin: Secondary | ICD-10-CM | POA: Diagnosis not present

## 2021-01-26 DIAGNOSIS — D689 Coagulation defect, unspecified: Secondary | ICD-10-CM | POA: Diagnosis not present

## 2021-01-26 DIAGNOSIS — D509 Iron deficiency anemia, unspecified: Secondary | ICD-10-CM | POA: Diagnosis not present

## 2021-01-26 DIAGNOSIS — T8249XA Other complication of vascular dialysis catheter, initial encounter: Secondary | ICD-10-CM | POA: Diagnosis not present

## 2021-01-26 DIAGNOSIS — E0821 Diabetes mellitus due to underlying condition with diabetic nephropathy: Secondary | ICD-10-CM | POA: Diagnosis not present

## 2021-01-26 DIAGNOSIS — N186 End stage renal disease: Secondary | ICD-10-CM | POA: Diagnosis not present

## 2021-01-26 DIAGNOSIS — Z992 Dependence on renal dialysis: Secondary | ICD-10-CM | POA: Diagnosis not present

## 2021-01-28 DIAGNOSIS — D689 Coagulation defect, unspecified: Secondary | ICD-10-CM | POA: Diagnosis not present

## 2021-01-28 DIAGNOSIS — N2581 Secondary hyperparathyroidism of renal origin: Secondary | ICD-10-CM | POA: Diagnosis not present

## 2021-01-28 DIAGNOSIS — Z992 Dependence on renal dialysis: Secondary | ICD-10-CM | POA: Diagnosis not present

## 2021-01-28 DIAGNOSIS — D509 Iron deficiency anemia, unspecified: Secondary | ICD-10-CM | POA: Diagnosis not present

## 2021-01-28 DIAGNOSIS — E0821 Diabetes mellitus due to underlying condition with diabetic nephropathy: Secondary | ICD-10-CM | POA: Diagnosis not present

## 2021-01-28 DIAGNOSIS — T8249XA Other complication of vascular dialysis catheter, initial encounter: Secondary | ICD-10-CM | POA: Diagnosis not present

## 2021-01-28 DIAGNOSIS — N186 End stage renal disease: Secondary | ICD-10-CM | POA: Diagnosis not present

## 2021-01-30 ENCOUNTER — Encounter: Payer: Self-pay | Admitting: Physical Therapy

## 2021-01-30 ENCOUNTER — Ambulatory Visit (INDEPENDENT_AMBULATORY_CARE_PROVIDER_SITE_OTHER): Payer: Medicare Other | Admitting: Physical Therapy

## 2021-01-30 ENCOUNTER — Other Ambulatory Visit: Payer: Self-pay

## 2021-01-30 DIAGNOSIS — R293 Abnormal posture: Secondary | ICD-10-CM | POA: Diagnosis not present

## 2021-01-30 DIAGNOSIS — M21372 Foot drop, left foot: Secondary | ICD-10-CM

## 2021-01-30 DIAGNOSIS — R2689 Other abnormalities of gait and mobility: Secondary | ICD-10-CM

## 2021-01-30 DIAGNOSIS — R2681 Unsteadiness on feet: Secondary | ICD-10-CM | POA: Diagnosis not present

## 2021-01-30 DIAGNOSIS — E1151 Type 2 diabetes mellitus with diabetic peripheral angiopathy without gangrene: Secondary | ICD-10-CM | POA: Diagnosis not present

## 2021-01-30 DIAGNOSIS — I1311 Hypertensive heart and chronic kidney disease without heart failure, with stage 5 chronic kidney disease, or end stage renal disease: Secondary | ICD-10-CM | POA: Diagnosis not present

## 2021-01-30 DIAGNOSIS — Z794 Long term (current) use of insulin: Secondary | ICD-10-CM | POA: Diagnosis not present

## 2021-01-30 DIAGNOSIS — N186 End stage renal disease: Secondary | ICD-10-CM | POA: Diagnosis not present

## 2021-01-30 NOTE — Therapy (Signed)
[facility] [address] [place], [place], [address] Phone: [phone]   Fax:  [phone]  Physical Therapy Treatment  Patient Details  Name: Alexander [name] MRN: [id number] Date of Birth: January 28, 1961 Referring Provider (PT): Meridee Score, MD   Encounter Date: 01/30/2021   PT End of Session - 01/30/21 0847    Visit Number 8    Number of Visits 25    Date for PT Re-Evaluation 04/04/21    Authorization Type BCBS Medicare    Authorization Time Period $40 co-pay    PT Start Time 0845    PT Stop Time 0926    PT Time Calculation (min) 41 min    Equipment Utilized During Treatment Gait belt    Activity Tolerance Patient tolerated treatment well    Behavior During Therapy [facility] for tasks assessed/performed           Past Medical History:  Diagnosis Date  . Alcohol abuse   . Anemia   . Arthritis    "patient does not think so."  . Atrial fibrillation ([place])   . Cardiac syncope 10/07/14   rapid A fib with 8 sec pauses on converison with syncope- temp pacing wire placed then PPM  . Cataract    BILATERAL  . Chronic kidney disease   . Coronary artery disease   . Diabetes mellitus    dx---been  awhile  . Encounter for care of pacemaker 10/08/2014  . History of blood transfusion   . Hyperlipemia   . Hypertension   . Osteomyelitis ([place])    right foot  . Peripheral vascular disease ([place])   . Presence of permanent cardiac pacemaker 10/08/2014  . Sinus node dysfunction ([place]) 10/08/2014    Past Surgical History:  Procedure Laterality Date  . ABDOMINAL AORTOGRAM W/LOWER EXTREMITY N/A 09/09/2017   Procedure: ABDOMINAL AORTOGRAM W/LOWER EXTREMITY;  Surgeon: Angelia Mould, MD;  Location: [place] [facility];  Service: Cardiovascular;  Laterality: N/A;  . ABDOMINAL AORTOGRAM W/LOWER EXTREMITY Right 11/11/2019   Procedure: ABDOMINAL AORTOGRAM W/LOWER EXTREMITY;  Surgeon: Marty Heck, MD;  Location: [place] [facility];  Service:  Cardiovascular;  Laterality: Right;  . AMPUTATION Left 08/19/2013   Procedure: AMPUTATION RAY;  Surgeon: Alta Corning, MD;  Location: [place];  Service: Orthopedics;  Laterality: Left;  ray amputation left 5th  . AMPUTATION Left 10/27/2013   Procedure: AMPUTATION DIGIT-LEFT 4TH TOE, 4th and 5th metatarsal.;  Surgeon: Angelia Mould, MD;  Location: [place];  Service: Vascular;  Laterality: Left;  . AMPUTATION Left 11/04/2013   Procedure: LEFT FOOT TRANS-METATARSAL AMPUTATION WITH WOUND CLOSURE ;  Surgeon: Alta Corning, MD;  Location: [place];  Service: Orthopedics;  Laterality: Left;  . AMPUTATION Right 09/10/2017   Procedure: RIGHT FOURTH AND FIFTH TOE AMPUTATION;  Surgeon: Angelia Mould, MD;  Location: [place];  Service: Vascular;  Laterality: Right;  . AMPUTATION Right 02/19/2020   Procedure: AMPUTATION RIGHT 3RD TOE;  Surgeon: Angelia Mould, MD;  Location: [place];  Service: Vascular;  Laterality: Right;  . AMPUTATION Right 03/11/2020   Procedure: RIGHT BELOW KNEE AMPUTATION;  Surgeon: Newt Minion, MD;  Location: [place];  Service: Orthopedics;  Laterality: Right;  . APPLICATION OF WOUND VAC Right 02/19/2020   Procedure: Application Of Wound Vac Right foot;  Surgeon: Angelia Mould, MD;  Location: [place];  Service: Vascular;  Laterality: Right;  . BIOPSY  07/20/2020   Procedure: BIOPSY;  Surgeon: Rush Landmark Telford Nab., MD;  Location:  [place] [facility];  Service: Gastroenterology;;  . CARDIAC CATHETERIZATION  10/07/2014   Procedure: LEFT HEART CATH AND CORONARY ANGIOGRAPHY;  Surgeon: Laverda Page, MD;  Location: [facility] CATH LAB;  Service: Cardiovascular;;  . COLONOSCOPY W/ POLYPECTOMY    . COLONOSCOPY WITH PROPOFOL N/A 05/25/2020   Procedure: COLONOSCOPY WITH PROPOFOL;  Surgeon: Gatha Mayer, MD;  Location: [facility];  Service: Endoscopy;  Laterality: N/A;  . EMBOLECTOMY Right 09/12/2017   Procedure: Thrombectomy  & Redo Right Below Knee Popliteal Artery Bypass Graft  ;   Surgeon: Waynetta Sandy, MD;  Location: [place];  Service: Vascular;  Laterality: Right;  . ENTEROSCOPY N/A 07/20/2020   Procedure: ENTEROSCOPY;  Surgeon: Mansouraty, Telford Nab., MD;  Location: [place];  Service: Gastroenterology;  Laterality: N/A;  . ESOPHAGOGASTRODUODENOSCOPY (EGD) WITH PROPOFOL N/A 05/24/2020   Procedure: ESOPHAGOGASTRODUODENOSCOPY (EGD) WITH PROPOFOL;  Surgeon: Ladene Artist, MD;  Location: [facility];  Service: Endoscopy;  Laterality: N/A;  . EYE SURGERY Bilateral    cataract  . FEMORAL-TIBIAL BYPASS GRAFT Left 10/27/2013   Procedure: BYPASS GRAFT LEFT FEMORAL- POSTERIOR TIBIAL ARTERY;  Surgeon: Angelia Mould, MD;  Location: [place];  Service: Vascular;  Laterality: Left;  . FEMORAL-TIBIAL BYPASS GRAFT Right 09/10/2017   Procedure: RIGHT SUPERFICIAL  FEMORAL ARTERY-BELOW KNEE POPLITEAL ARTERY BYPASS GRAFT WITH VEIN;  Surgeon: Angelia Mould, MD;  Location: [place];  Service: Vascular;  Laterality: Right;  . GIVENS CAPSULE STUDY N/A 07/20/2020   Procedure: GIVENS CAPSULE STUDY;  Surgeon: Irving Copas., MD;  Location: [place];  Service: Gastroenterology;  Laterality: N/A;  capsule placed through scope into duodenum @ 0936  . I & D EXTREMITY Left 10/27/2013   Procedure: IRRIGATION AND DEBRIDEMENT EXTREMITY- LEFT FOOT;  Surgeon: Angelia Mould, MD;  Location: [place];  Service: Vascular;  Laterality: Left;  . I & D EXTREMITY Right 01/26/2020   Procedure: IRRIGATION AND DEBRIDEMENT EXTREMITY RIGHT FOOT WOUND;  Surgeon: Angelia Mould, MD;  Location: [facility];  Service: Vascular;  Laterality: Right;  . INTRAOPERATIVE ARTERIOGRAM Right 09/10/2017   Procedure: INTRA OPERATIVE ARTERIOGRAM;  Surgeon: Angelia Mould, MD;  Location: [place];  Service: Vascular;  Laterality: Right;  . IR ANGIOGRAM FOLLOW UP STUDY  09/09/2017  . IR FLUORO GUIDE CV LINE RIGHT  05/31/2020  . IR US GUIDE VASC ACCESS RIGHT  06/01/2020  . LEFT HEART CATH  AND CORONARY ANGIOGRAPHY N/A 08/19/2020   Procedure: LEFT HEART CATH AND CORONARY ANGIOGRAPHY;  Surgeon: Adrian Prows, MD;  Location: [place] [facility];  Service: Cardiovascular;  Laterality: N/A;  . LEFT HEART CATHETERIZATION WITH CORONARY ANGIOGRAM N/A 11/09/2014   Procedure: LEFT HEART CATHETERIZATION WITH CORONARY ANGIOGRAM;  Surgeon: Laverda Page, MD;  Location: [facility] CATH LAB;  Service: Cardiovascular;  Laterality: N/A;  . LOWER EXTREMITY ANGIOGRAM Right 11/08/2015   Procedure: Lower Extremity Angiogram;  Surgeon: Serafina Mitchell, MD;  Location: [place] [facility];  Service: Cardiovascular;  Laterality: Right;  . LUMBAR LAMINECTOMY  06/13/2012   Procedure: MICRODISCECTOMY LUMBAR LAMINECTOMY;  Surgeon: Jessy Oto, MD;  Location: [place];  Service: Orthopedics;  Laterality: N/A;  Central laminectomy L2-3, L3-4, L4-5  . PACEMAKER INSERTION  10/08/14   MDT Advisa MRI compatible dual chamber pacemaker implanted by Dr Caryl Comes for syncope with post-termination pauses  . PERCUTANEOUS CORONARY STENT INTERVENTION (PCI-S)  11/09/2014   des to lad & distal circumflex         Dr  [name]  . PERIPHERAL VASCULAR  BALLOON ANGIOPLASTY Right 11/11/2019   Procedure: PERIPHERAL VASCULAR BALLOON ANGIOPLASTY;  Surgeon: Marty Heck, MD;  Location: [place] [facility];  Service: Cardiovascular;  Laterality: Right;  below knee popliteal, tibioperoneal trunk, posterior tibial arteries  . PERIPHERAL VASCULAR CATHETERIZATION N/A 11/08/2015   Procedure: Abdominal Aortogram;  Surgeon: Serafina Mitchell, MD;  Location: [place] [facility];  Service: Cardiovascular;  Laterality: N/A;  . PERIPHERAL VASCULAR CATHETERIZATION Right 11/08/2015   Procedure: Peripheral Vascular Atherectomy;  Surgeon: Serafina Mitchell, MD;  Location: [place] [facility];  Service: Cardiovascular;  Laterality: Right;  . PERIPHERAL VASCULAR CATHETERIZATION N/A 10/10/2016   Procedure: Lower Extremity Angiography;  Surgeon: Waynetta Sandy, MD;   Location: [place] [facility];  Service: Cardiovascular;  Laterality: N/A;  . PERIPHERAL VASCULAR CATHETERIZATION Right 10/10/2016   Procedure: Peripheral Vascular Atherectomy;  Surgeon: Waynetta Sandy, MD;  Location: [place] [facility];  Service: Cardiovascular;  Laterality: Right;  Popliteal  . PERMANENT PACEMAKER INSERTION N/A 10/08/2014   Procedure: PERMANENT PACEMAKER INSERTION;  Surgeon: Deboraha Sprang, MD;  Location: [facility] CATH LAB;  Service: Cardiovascular;  Laterality: N/A;  . SUBMUCOSAL TATTOO INJECTION  07/20/2020   Procedure: SUBMUCOSAL TATTOO INJECTION;  Surgeon: Irving Copas., MD;  Location: [place];  Service: Gastroenterology;;  . TEMPORARY PACEMAKER INSERTION Bilateral 10/07/2014   Procedure: TEMPORARY PACEMAKER INSERTION;  Surgeon: Laverda Page, MD;  Location: [facility] CATH LAB;  Service: Cardiovascular;  Laterality: Bilateral;  . WOUND DEBRIDEMENT Right 02/19/2020   Procedure: DEBRIDEMENT WOUND RIGHT FOOT;  Surgeon: Angelia Mould, MD;  Location: [place];  Service: Vascular;  Laterality: Right;    There were no vitals filed for this visit.   Subjective Assessment - 01/30/21 0845    Subjective He has not worn to dialysis yet. He is wearing prosthesis all awake hours & drying prn.    Pertinent History Rt TTA, artthritis, A-Fib, cardiac syncope, cataract, CKD, CAD, DM, ESRD w/HD since 7/21, HTN, PVD, cardiac pacemaker,    Limitations Walking;House hold activities    Patient Stated Goals to use prosthesis to walk in community, to hunt & fish, yardwork & garden    Currently in Pain? No/denies                             [facility] Adult PT Treatment/Exercise - 01/30/21 0845      Transfers   Transfers Sit to Stand;Stand to Sit    Sit to Stand 5: Supervision;With upper extremity assist;With armrests;From chair/3-in-1;Other (comment)   requires RW support for stabilization   Stand to Sit 5: Supervision;With upper extremity assist;With  armrests;To chair/3-in-1;Other (comment)   requires RW for stabilization     Ambulation/Gait   Ambulation/Gait Yes    Ambulation/Gait Assistance 5: Supervision    Ambulation/Gait Assistance Details demo & verbal cues on step width & wt shift over prosthesis in stance.  PT educated on long walk LLE circulation pain to medium level.    Ambulation Distance (Feet) 215 Feet   max tolerable distance   Assistive device Rolling walker;Prosthesis    Ambulation Surface Level;Indoor    Ramp 4: Min assist   min gaurd RW & TTA prosthesis   Ramp Details (indicate cue type and reason) verbal & tactile cues on technique    Curb 4: Min assist   min gaurd RW & TTA prosthesis   Curb Details (indicate cue type and reason) verbal & tactile cues on technique  High Level Balance   High Level Balance Activities --   //bars     Self-Care   Self-Care Lifting    Lifting PT demo & verbal cues on technique with TTA prosthesis. PT return demo using RW for stabilization      Exercises   Exercises Knee/Hip      Knee/Hip Exercises: Stretches   Active Hamstring Stretch Right;2 reps;20 seconds    Active Hamstring Stretch Limitations SLR with strap DF      Knee/Hip Exercises: Aerobic   Nustep seat 11 level 6 with BLEs & BUEs 8 min      Knee/Hip Exercises: Machines for Strengthening   Cybex Leg Press shuttle leg press BLEs 112# 15 reps 2 sets      Prosthetics   Current prosthetic wear tolerance (days/week)  daily    Current prosthetic wear tolerance (#hours/day)  nondialysis days & after dialysis all awake hours except 2 hrs midday    Current prosthetic weight-bearing tolerance (hours/day)  Patient tolerated standing & gait with partial weight on prosthesis for 5 minutes with no c/o limb pain or discomfort.    Edema Pitting edema with 5-7 sec capillary refill    Residual limb condition  No open areas, normal color, tempertaure & moisture, shape cylinderical    Education Provided Correct ply sock  adjustment;Proper Donning;Proper wear schedule/adjustment;Other (comment)   see prosthetic care comments   Person(s) Educated Patient    Education Method Explanation;Verbal cues    Education Method Verbalized understanding;Verbal cues required;Needs further instruction    Donning Prosthesis Modified independent (device/increased time)                    PT Short Term Goals - 01/30/21 1013      PT SHORT TERM GOAL #1   Title Patteint donnes prosthesis correctly and verbalizes proper cleaning.    Time 1    Period Months    Status Achieved    Target Date 02/01/21      PT SHORT TERM GOAL #2   Title Pateint tolerates prosthesis wear >10 hours total / day with skin issues.    Time 1    Period Months    Status Achieved    Target Date 02/01/21      PT SHORT TERM GOAL #3   Title Pateint is able to reach 10" anteriorly & pick up item from floor with rolling walker safely.    Time 1    Period Months    Status Achieved    Target Date 02/01/21      PT SHORT TERM GOAL #4   Title Pateint ambulates 200' with RW & prosthesis with supervision.    Time 1    Period Months    Status Achieved    Target Date 02/01/21      PT SHORT TERM GOAL #5   Title Patient negotiates ramps & curbs with RW & prosthesis with minA.    Time 1    Period Months    Status Achieved    Target Date 02/01/21             PT Long Term Goals - 01/18/21 0941      PT LONG TERM GOAL #1   Title Patient verbalizes & demonstrates understanding of prosthetic care to enable safe utilization of prosthesis.    Time 3    Period Months    Status On-going    Target Date 04/03/21      PT LONG TERM GOAL #2  Title Patient tolerates wear of prosthesis >90% of awake hours without skin or limb pain issues to enable function throughout his day.    Time 3    Period Months    Status On-going    Target Date 04/03/21      PT LONG TERM GOAL #3   Title Task of [place] Balance with RW  >/= 45/56    Time 3    Period  Months    Status On-going    Target Date 04/03/21      PT LONG TERM GOAL #4   Title Patient ambulates >400' with LRAD & prosthesis modified independent.    Time 3    Period Months    Status On-going    Target Date 04/03/21      PT LONG TERM GOAL #5   Title Patient negotiates ramps, curbs & stairs with LRAD & prosthesis modified independent.    Time 3    Period Months    Status On-going    Target Date 04/03/21                 Plan - 01/30/21 0848    Clinical Impression Statement Patient met all STGs set for first 30 days. He seems to understand PT recommendation for increasing activity level.    Personal Factors and Comorbidities Comorbidity 3+;Fitness;Time since onset of injury/illness/exacerbation;Past/Current Experience    Comorbidities Rt TTA, left Transmetatarsal Amputation, artthritis, A-Fib, cardiac syncope, cataract, CKD, CAD, DM, ESRD w/HD since 7/21, HTN, PVD, cardiac pacemaker,    Examination-Activity Limitations Locomotion Level;Squat;Stairs;Stand;Toileting;Transfers    Examination-Participation Restrictions Community Activity    Stability/Clinical Decision Making Evolving/Moderate complexity    Rehab Potential Good    PT Frequency 2x / week    PT Duration 12 weeks    PT Treatment/Interventions ADLs/Self Care Home Management;DME Instruction;Gait training;Stair training;Functional mobility training;Therapeutic activities;Therapeutic exercise;Balance training;Neuromuscular re-education;Patient/family education;Prosthetic Training;Vestibular    PT Next Visit Plan review prosthetic care, prosthetic gait including ramps & curbs. standing balance & strength    Consulted and Agree with Plan of Care Patient           Patient will benefit from skilled therapeutic intervention in order to improve the following deficits and impairments:  Abnormal gait,Cardiopulmonary status limiting activity,Decreased activity tolerance,Decreased balance,Decreased endurance,Decreased  knowledge of use of DME,Decreased mobility,Decreased scar mobility,Decreased strength,Increased edema,Impaired flexibility,Postural dysfunction,Prosthetic Dependency,Obesity  Visit Diagnosis: Other abnormalities of gait and mobility  Unsteadiness on feet  Abnormal posture  Foot drop, left     Problem List Patient Active Problem List   Diagnosis Date Noted  . NSVT (nonsustained ventricular tachycardia) ([place])   . Pancytopenia ([place])   . Acute blood loss anemia 07/18/2020  . Hemodialysis patient ([place]) 06/04/2020  . Necrotizing glomerulonephritis 06/04/2020  . Heme positive stool   . Melena   . Upper GI bleeding 05/23/2020  . Acute GI bleeding 05/23/2020  . Acute hematogenous osteomyelitis of right foot ([place]) 03/11/2020  . Gangrene of right foot ([place])   . Diabetic foot ulcer associated with type 2 diabetes mellitus ([place]) 02/19/2020  . Heel ulcer ([place]) 11/05/2019  . Atherosclerotic PVD with ulceration ([place]) 09/09/2017  . Essential hypertension, benign 02/07/2015  . Renal insufficiency 02/07/2015  . Post PTCA 11/09/2014  . S/P PTCA (percutaneous transluminal coronary angioplasty) 11/09/2014  . Pacemaker: [place] DR MRI [id number] Dual chamber pacemaker 10/08/2014 10/08/2014  . Encounter for care of pacemaker 10/08/2014  . Sinus node dysfunction ([place]) 10/08/2014  . Cardiac asystole ([place]) 10/07/2014  . Cardiac syncope  10/07/2014  . Diabetes mellitus with stage 3 chronic kidney disease ([place]) 10/07/2014  . CAD (coronary artery disease), native coronary artery 10/07/2014  . Diabetes mellitus due to underlying condition without complications ([place]) 45/62/5638  . Anemia, unspecified 05/25/2014  . Syncope 04/30/2014  . Atherosclerosis of native arteries of the extremities with gangrene ([place]) 12/16/2013  . Volume overload 10/30/2013  . Acute renal failure ([place]) 10/28/2013  . PAD (peripheral artery disease) ([place]) 10/27/2013  . Leukocytosis 10/21/2013  . Anemia 10/21/2013  .  Osteomyelitis of left foot ([place]) 08/19/2013  . Spinal stenosis, lumbar region, with neurogenic claudication 06/12/2012    Class: Diagnosis of  . DIABETES MELLITUS, TYPE II 03/15/2009  . ALCOHOL ABUSE 03/15/2009  . TOBACCO USE 03/15/2009  . HYPERTENSION 03/15/2009    Jamey Reas, PT, DPT 01/30/2021, 10:16 AM  [facility] [address] [place], [place], [address] Phone: [phone]   Fax:  [phone]  Name: Alexander [name] MRN: [id number] Date of Birth: 06-28-1961

## 2021-01-31 DIAGNOSIS — Z992 Dependence on renal dialysis: Secondary | ICD-10-CM | POA: Diagnosis not present

## 2021-01-31 DIAGNOSIS — D631 Anemia in chronic kidney disease: Secondary | ICD-10-CM | POA: Diagnosis not present

## 2021-01-31 DIAGNOSIS — T8249XA Other complication of vascular dialysis catheter, initial encounter: Secondary | ICD-10-CM | POA: Diagnosis not present

## 2021-01-31 DIAGNOSIS — E0821 Diabetes mellitus due to underlying condition with diabetic nephropathy: Secondary | ICD-10-CM | POA: Diagnosis not present

## 2021-01-31 DIAGNOSIS — D509 Iron deficiency anemia, unspecified: Secondary | ICD-10-CM | POA: Diagnosis not present

## 2021-01-31 DIAGNOSIS — N186 End stage renal disease: Secondary | ICD-10-CM | POA: Diagnosis not present

## 2021-01-31 DIAGNOSIS — N2581 Secondary hyperparathyroidism of renal origin: Secondary | ICD-10-CM | POA: Diagnosis not present

## 2021-01-31 DIAGNOSIS — D689 Coagulation defect, unspecified: Secondary | ICD-10-CM | POA: Diagnosis not present

## 2021-02-01 ENCOUNTER — Encounter: Payer: Self-pay | Admitting: Physical Therapy

## 2021-02-01 ENCOUNTER — Other Ambulatory Visit: Payer: Self-pay

## 2021-02-01 ENCOUNTER — Ambulatory Visit (INDEPENDENT_AMBULATORY_CARE_PROVIDER_SITE_OTHER): Payer: Medicare Other | Admitting: Physical Therapy

## 2021-02-01 DIAGNOSIS — R2681 Unsteadiness on feet: Secondary | ICD-10-CM | POA: Diagnosis not present

## 2021-02-01 DIAGNOSIS — M21372 Foot drop, left foot: Secondary | ICD-10-CM

## 2021-02-01 DIAGNOSIS — R2689 Other abnormalities of gait and mobility: Secondary | ICD-10-CM

## 2021-02-01 DIAGNOSIS — R293 Abnormal posture: Secondary | ICD-10-CM

## 2021-02-01 NOTE — Therapy (Signed)
[facility] Physical Therapy [address] [place], [place], [address] Phone: [phone]   Fax:  [phone]  Physical Therapy Treatment  Patient Details  Name: Alexander [name] MRN: [id number] Date of Birth: [birth year] Referring Provider (PT): Meridee Score, MD   Encounter Date: 02/01/2021   PT End of Session - 02/01/21 0855    Visit Number 9    Number of Visits 25    Date for PT Re-Evaluation 04/04/21    Authorization Type BCBS Medicare    Authorization Time Period $40 co-pay    PT Start Time 0849    PT Stop Time 0929    PT Time Calculation (min) 40 min    Equipment Utilized During Treatment Gait belt    Activity Tolerance Patient tolerated treatment well    Behavior During Therapy [facility] for tasks assessed/performed           Past Medical History:  Diagnosis Date  . Alcohol abuse   . Anemia   . Arthritis    "patient does not think so."  . Atrial fibrillation ([place])   . Cardiac syncope 10/07/14   rapid A fib with 8 sec pauses on converison with syncope- temp pacing wire placed then PPM  . Cataract    BILATERAL  . Chronic kidney disease   . Coronary artery disease   . Diabetes mellitus    dx---been  awhile  . Encounter for care of pacemaker 10/08/2014  . History of blood transfusion   . Hyperlipemia   . Hypertension   . Osteomyelitis ([place])    right foot  . Peripheral vascular disease ([place])   . Presence of permanent cardiac pacemaker 10/08/2014  . Sinus node dysfunction ([place]) 10/08/2014    Past Surgical History:  Procedure Laterality Date  . ABDOMINAL AORTOGRAM W/LOWER EXTREMITY N/A 09/09/2017   Procedure: ABDOMINAL AORTOGRAM W/LOWER EXTREMITY;  Surgeon: Angelia Mould, MD;  Location: [place] [facility];  Service: Cardiovascular;  Laterality: N/A;  . ABDOMINAL AORTOGRAM W/LOWER EXTREMITY Right 11/11/2019   Procedure: ABDOMINAL AORTOGRAM W/LOWER EXTREMITY;  Surgeon: Marty Heck, MD;  Location: [place] [facility];  Service:  Cardiovascular;  Laterality: Right;  . AMPUTATION Left 08/19/2013   Procedure: AMPUTATION RAY;  Surgeon: Alta Corning, MD;  Location: [place];  Service: Orthopedics;  Laterality: Left;  ray amputation left 5th  . AMPUTATION Left 10/27/2013   Procedure: AMPUTATION DIGIT-LEFT 4TH TOE, 4th and 5th metatarsal.;  Surgeon: Angelia Mould, MD;  Location: [place];  Service: Vascular;  Laterality: Left;  . AMPUTATION Left 11/04/2013   Procedure: LEFT FOOT TRANS-METATARSAL AMPUTATION WITH WOUND CLOSURE ;  Surgeon: Alta Corning, MD;  Location: [place];  Service: Orthopedics;  Laterality: Left;  . AMPUTATION Right 09/10/2017   Procedure: RIGHT FOURTH AND FIFTH TOE AMPUTATION;  Surgeon: Angelia Mould, MD;  Location: [place];  Service: Vascular;  Laterality: Right;  . AMPUTATION Right 02/19/2020   Procedure: AMPUTATION RIGHT 3RD TOE;  Surgeon: Angelia Mould, MD;  Location: [place];  Service: Vascular;  Laterality: Right;  . AMPUTATION Right 03/11/2020   Procedure: RIGHT BELOW KNEE AMPUTATION;  Surgeon: Newt Minion, MD;  Location: [place];  Service: Orthopedics;  Laterality: Right;  . APPLICATION OF WOUND VAC Right 02/19/2020   Procedure: Application Of Wound Vac Right foot;  Surgeon: Angelia Mould, MD;  Location: [place];  Service: Vascular;  Laterality: Right;  . BIOPSY  07/20/2020   Procedure: BIOPSY;  Surgeon: Rush Landmark Telford Nab., MD;  Location:  [place] [facility];  Service: Gastroenterology;;  . CARDIAC CATHETERIZATION  10/07/2014   Procedure: LEFT HEART CATH AND CORONARY ANGIOGRAPHY;  Surgeon: Laverda Page, MD;  Location: [facility] C[facility];  Service: Cardiovascular;;  . COLONOSCOPY W/ POLYPECTOMY    . COLONOSCOPY WITH PROPOFOL N/A 05/25/2020   Procedure: COLONOSCOPY WITH PROPOFOL;  Surgeon: Gatha Mayer, MD;  Location: [facility];  Service: Endoscopy;  Laterality: N/A;  . EMBOLECTOMY Right 09/12/2017   Procedure: Thrombectomy  & Redo Right Below Knee Popliteal Artery Bypass Graft  ;   Surgeon: Waynetta Sandy, MD;  Location: [place];  Service: Vascular;  Laterality: Right;  . ENTEROSCOPY N/A 07/20/2020   Procedure: ENTEROSCOPY;  Surgeon: Mansouraty, Telford Nab., MD;  Location: [place];  Service: Gastroenterology;  Laterality: N/A;  . ESOPHAGOGASTRODUODENOSCOPY (EGD) WITH PROPOFOL N/A 05/24/2020   Procedure: ESOPHAGOGASTRODUODENOSCOPY (EGD) WITH PROPOFOL;  Surgeon: Ladene Artist, MD;  Location: [facility];  Service: Endoscopy;  Laterality: N/A;  . EYE SURGERY Bilateral    cataract  . FEMORAL-TIBIAL BYPASS GRAFT Left 10/27/2013   Procedure: BYPASS GRAFT LEFT FEMORAL- POSTERIOR TIBIAL ARTERY;  Surgeon: Angelia Mould, MD;  Location: [place];  Service: Vascular;  Laterality: Left;  . FEMORAL-TIBIAL BYPASS GRAFT Right 09/10/2017   Procedure: RIGHT SUPERFICIAL  FEMORAL ARTERY-BELOW KNEE POPLITEAL ARTERY BYPASS GRAFT WITH VEIN;  Surgeon: Angelia Mould, MD;  Location: [place];  Service: Vascular;  Laterality: Right;  . GIVENS CAPSULE STUDY N/A 07/20/2020   Procedure: GIVENS CAPSULE STUDY;  Surgeon: Irving Copas., MD;  Location: [place];  Service: Gastroenterology;  Laterality: N/A;  capsule placed through scope into duodenum @ 0936  . I & D EXTREMITY Left 10/27/2013   Procedure: IRRIGATION AND DEBRIDEMENT EXTREMITY- LEFT FOOT;  Surgeon: Angelia Mould, MD;  Location: [place];  Service: Vascular;  Laterality: Left;  . I & D EXTREMITY Right 01/26/2020   Procedure: IRRIGATION AND DEBRIDEMENT EXTREMITY RIGHT FOOT WOUND;  Surgeon: Angelia Mould, MD;  Location: [place];  Service: Vascular;  Laterality: Right;  . INTRAOPERATIVE ARTERIOGRAM Right 09/10/2017   Procedure: INTRA OPERATIVE ARTERIOGRAM;  Surgeon: Angelia Mould, MD;  Location: [place];  Service: Vascular;  Laterality: Right;  . IR ANGIOGRAM FOLLOW UP STUDY  09/09/2017  . IR FLUORO GUIDE CV LINE RIGHT  05/31/2020  . IR US GUIDE VASC ACCESS RIGHT  06/01/2020  . LEFT HEART CATH  AND CORONARY ANGIOGRAPHY N/A 08/19/2020   Procedure: LEFT HEART CATH AND CORONARY ANGIOGRAPHY;  Surgeon: Adrian Prows, MD;  Location: [place] [facility];  Service: Cardiovascular;  Laterality: N/A;  . LEFT HEART CATHETERIZATION WITH CORONARY ANGIOGRAM N/A 11/09/2014   Procedure: LEFT HEART CATHETERIZATION WITH CORONARY ANGIOGRAM;  Surgeon: Laverda Page, MD;  Location: [facility] CATH LAB;  Service: Cardiovascular;  Laterality: N/A;  . LOWER EXTREMITY ANGIOGRAM Right 11/08/2015   Procedure: Lower Extremity Angiogram;  Surgeon: Serafina Mitchell, MD;  Location: [place] [facility];  Service: Cardiovascular;  Laterality: Right;  . LUMBAR LAMINECTOMY  06/13/2012   Procedure: MICRODISCECTOMY LUMBAR LAMINECTOMY;  Surgeon: Jessy Oto, MD;  Location: [place];  Service: Orthopedics;  Laterality: N/A;  Central laminectomy L2-3, L3-4, L4-5  . PACEMAKER INSERTION  10/08/14   MDT Advisa MRI compatible dual chamber pacemaker implanted by Dr Caryl Comes for syncope with post-termination pauses  . PERCUTANEOUS CORONARY STENT INTERVENTION (PCI-S)  11/09/2014   des to lad & distal circumflex         Dr  [name]  . PERIPHERAL VASCULAR  BALLOON ANGIOPLASTY Right 11/11/2019   Procedure: PERIPHERAL VASCULAR BALLOON ANGIOPLASTY;  Surgeon: Marty Heck, MD;  Location: [place] [facility];  Service: Cardiovascular;  Laterality: Right;  below knee popliteal, tibioperoneal trunk, posterior tibial arteries  . PERIPHERAL VASCULAR CATHETERIZATION N/A 11/08/2015   Procedure: Abdominal Aortogram;  Surgeon: Serafina Mitchell, MD;  Location: [place] [facility];  Service: Cardiovascular;  Laterality: N/A;  . PERIPHERAL VASCULAR CATHETERIZATION Right 11/08/2015   Procedure: Peripheral Vascular Atherectomy;  Surgeon: Serafina Mitchell, MD;  Location: [place] [facility];  Service: Cardiovascular;  Laterality: Right;  . PERIPHERAL VASCULAR CATHETERIZATION N/A 10/10/2016   Procedure: Lower Extremity Angiography;  Surgeon: Waynetta Sandy, MD;   Location: [place] [facility];  Service: Cardiovascular;  Laterality: N/A;  . PERIPHERAL VASCULAR CATHETERIZATION Right 10/10/2016   Procedure: Peripheral Vascular Atherectomy;  Surgeon: Waynetta Sandy, MD;  Location: [place] [facility];  Service: Cardiovascular;  Laterality: Right;  Popliteal  . PERMANENT PACEMAKER INSERTION N/A 10/08/2014   Procedure: PERMANENT PACEMAKER INSERTION;  Surgeon: Deboraha Sprang, MD;  Location: [facility] C[facility] LAB;  Service: Cardiovascular;  Laterality: N/A;  . SUBMUCOSAL TATTOO INJECTION  07/20/2020   Procedure: SUBMUCOSAL TATTOO INJECTION;  Surgeon: Irving Copas., MD;  Location: [place];  Service: Gastroenterology;;  . TEMPORARY PACEMAKER INSERTION Bilateral 10/07/2014   Procedure: TEMPORARY PACEMAKER INSERTION;  Surgeon: Laverda Page, MD;  Location: [facility] CATH LAB;  Service: Cardiovascular;  Laterality: Bilateral;  . WOUND DEBRIDEMENT Right 02/19/2020   Procedure: DEBRIDEMENT WOUND RIGHT FOOT;  Surgeon: Angelia Mould, MD;  Location: [place];  Service: Vascular;  Laterality: Right;    There were no vitals filed for this visit.   Subjective Assessment - 02/01/21 0849    Subjective On non-dialysis days, he is wearing most of awake hours and after dialysis most of awake hours.    Pertinent History Rt TTA, artthritis, A-Fib, cardiac syncope, cataract, CKD, CAD, DM, ESRD w/HD since 7/21, HTN, PVD, cardiac pacemaker,    Limitations Walking;House hold activities    Patient Stated Goals to use prosthesis to walk in community, to hunt & fish, yardwork & garden    Currently in Pain? No/denies                             [facility] Adult PT Treatment/Exercise - 02/01/21 0849      Transfers   Transfers Sit to Stand;Stand to Sit    Sit to Stand 5: Supervision;With upper extremity assist;With armrests;From chair/3-in-1;Other (comment)   requires RW support for stabilization   Stand to Sit 5: Supervision;With upper extremity assist;With  armrests;To chair/3-in-1;Other (comment)   requires RW for stabilization     Ambulation/Gait   Ambulation/Gait Yes    Ambulation/Gait Assistance 3: Mod assist;5: Supervision   ModA cane/HHA or supervision cane LUE / table top RUE   Ambulation/Gait Assistance Details demo, verbal & manual cues on cane technique. He requires HHA RUE.    Ambulation Distance (Feet) 75 Feet   75' gym & 30' around table   Assistive device Prosthesis;Straight cane;1 person hand held assist   straight cane stand alone tip   Ambulation Surface Level;Indoor    Ramp --    Curb --      High Level Balance   High Level Balance Activities Side stepping;Turns;Backward walking   //bars   High Level Balance Comments PT demo & verbal cues on technique      Self-Care  Self-Care --    Lifting --      Exercises   Exercises Knee/Hip      Knee/Hip Exercises: Stretches   Active Hamstring Stretch Right;2 reps;20 seconds    Active Hamstring Stretch Limitations SLR with strap DF      Knee/Hip Exercises: Aerobic   Nustep seat 11 level 6 with BLEs & BUEs 8 min      Knee/Hip Exercises: Machines for Strengthening   Cybex Leg Press --      Knee/Hip Exercises: Standing   Forward Step Up Right;Left;1 set;10 reps;Hand Hold: 2;Step Height: 6"    Forward Step Up Limitations verbal cues on technique    Step Down Right;Left;1 set;10 reps;Hand Hold: 2;Step Height: 6"    Step Down Limitations verbal cues on technique    Rocker Board 1 minute   round board, ant/mid/post & right/mid/left,   Rocker Board Limitations mirror for visual upright, verbal cues on control, BUE support on //bars      Prosthetics   Current prosthetic wear tolerance (days/week)  daily    Current prosthetic wear tolerance (#hours/day)  nondialysis days & after dialysis all awake hours except 2 hrs midday    Current prosthetic weight-bearing tolerance (hours/day)  Patient tolerated standing & gait with partial weight on prosthesis for 5 minutes with no c/o limb  pain or discomfort.    Edema Pitting edema with 5-7 sec capillary refill    Residual limb condition  No open areas, normal color, tempertaure & moisture, shape cylinderical    Education Provided Correct ply sock adjustment;Proper Donning;Proper wear schedule/adjustment;Other (comment)   see prosthetic care comments                   PT Short Term Goals - 02/01/21 1207      PT SHORT TERM GOAL #1   Title Patient verbalizes proper weighing & prosthetic issues with dialysis.    Time 1    Period Months    Status New    Target Date 03/03/21      PT SHORT TERM GOAL #2   Title Pateint tolerates prosthesis wear >90% of awake hours / day with no skin issues.    Time 1    Period Months    Status Revised    Target Date 03/03/21      PT SHORT TERM GOAL #3   Title Pateint is able to reach 10" anteriorly & pick up item from floor with cane safely.    Time 1    Period Months    Status Revised    Target Date 03/03/21      PT SHORT TERM GOAL #4   Title Pateint ambulates 300' with RW & prosthesis modified independent.    Time 1    Period Months    Status Revised    Target Date 03/03/21      PT SHORT TERM GOAL #5   Title Patient negotiates ramps & curbs with RW & prosthesis with supervision    Time 1    Period Months    Status Revised    Target Date 03/03/21      Additional Short Term Goals   Additional Short Term Goals Yes      PT SHORT TERM GOAL #6   Title Patient ambulates 17' with cane & prosthesis with minA    Time 4    Period Weeks    Status New    Target Date 03/03/21  PT Long Term Goals - 01/18/21 0941      PT LONG TERM GOAL #1   Title Patient verbalizes & demonstrates understanding of prosthetic care to enable safe utilization of prosthesis.    Time 3    Period Months    Status On-going    Target Date 04/03/21      PT LONG TERM GOAL #2   Title Patient tolerates wear of prosthesis >90% of awake hours without skin or limb pain issues to  enable function throughout his day.    Time 3    Period Months    Status On-going    Target Date 04/03/21      PT LONG TERM GOAL #3   Title Task of [place] Balance with RW  >/= 45/56    Time 3    Period Months    Status On-going    Target Date 04/03/21      PT LONG TERM GOAL #4   Title Patient ambulates >400' with LRAD & prosthesis modified independent.    Time 3    Period Months    Status On-going    Target Date 04/03/21      PT LONG TERM GOAL #5   Title Patient negotiates ramps, curbs & stairs with LRAD & prosthesis modified independent.    Time 3    Period Months    Status On-going    Target Date 04/03/21                 Plan - 02/01/21 0855    Clinical Impression Statement PT progressed prosthetic gait in clinic to cane with HHA.  He was challenged by activity but has potential with further training.  PT continues to work on strength, flexibility, endurance & balance to improve function & safety.    Personal Factors and Comorbidities Comorbidity 3+;Fitness;Time since onset of injury/illness/exacerbation;Past/Current Experience    Comorbidities Rt TTA, left Transmetatarsal Amputation, artthritis, A-Fib, cardiac syncope, cataract, CKD, CAD, DM, ESRD w/HD since 7/21, HTN, PVD, cardiac pacemaker,    Examination-Activity Limitations Locomotion Level;Squat;Stairs;Stand;Toileting;Transfers    Examination-Participation Restrictions Community Activity    Stability/Clinical Decision Making Evolving/Moderate complexity    Rehab Potential Good    PT Frequency 2x / week    PT Duration 12 weeks    PT Treatment/Interventions ADLs/Self Care Home Management;DME Instruction;Gait training;Stair training;Functional mobility training;Therapeutic activities;Therapeutic exercise;Balance training;Neuromuscular re-education;Patient/family education;Prosthetic Training;Vestibular    PT Next Visit Plan review prosthetic care, prosthetic gait household level with cane & community level with RW  including ramps & curbs. standing balance & strength    Consulted and Agree with Plan of Care Patient           Patient will benefit from skilled therapeutic intervention in order to improve the following deficits and impairments:  Abnormal gait,Cardiopulmonary status limiting activity,Decreased activity tolerance,Decreased balance,Decreased endurance,Decreased knowledge of use of DME,Decreased mobility,Decreased scar mobility,Decreased strength,Increased edema,Impaired flexibility,Postural dysfunction,Prosthetic Dependency,Obesity  Visit Diagnosis: Unsteadiness on feet  Abnormal posture  Foot drop, left  Other abnormalities of gait and mobility     Problem List Patient Active Problem List   Diagnosis Date Noted  . NSVT (nonsustained ventricular tachycardia) ([place])   . Pancytopenia ([place])   . Acute blood loss anemia 07/18/2020  . Hemodialysis patient ([place]) 06/04/2020  . Necrotizing glomerulonephritis 06/04/2020  . Heme positive stool   . Melena   . Upper GI bleeding 05/23/2020  . Acute GI bleeding 05/23/2020  . Acute hematogenous osteomyelitis of right foot ([place]) 03/11/2020  . Gangrene  of right foot ([place])   . Diabetic foot ulcer associated with type 2 diabetes mellitus ([place]) 02/19/2020  . Heel ulcer ([place]) 11/05/2019  . Atherosclerotic PVD with ulceration ([place]) 09/09/2017  . Essential hypertension, benign 02/07/2015  . Renal insufficiency 02/07/2015  . Post PTCA 11/09/2014  . S/P PTCA (percutaneous transluminal coronary angioplasty) 11/09/2014  . Pacemaker: [place] DR MRI [id number] Dual chamber pacemaker 10/08/2014 10/08/2014  . Encounter for care of pacemaker 10/08/2014  . Sinus node dysfunction ([place]) 10/08/2014  . Cardiac asystole ([place]) 10/07/2014  . Cardiac syncope 10/07/2014  . Diabetes mellitus with stage 3 chronic kidney disease ([place]) 10/07/2014  . CAD (coronary artery disease), native coronary artery 10/07/2014  . Diabetes mellitus due to underlying  condition without complications ([place]) 26/37/8588  . Anemia, unspecified 05/25/2014  . Syncope 04/30/2014  . Atherosclerosis of native arteries of the extremities with gangrene ([place]) 12/16/2013  . Volume overload 10/30/2013  . Acute renal failure ([place]) 10/28/2013  . PAD (peripheral artery disease) ([place]) 10/27/2013  . Leukocytosis 10/21/2013  . Anemia 10/21/2013  . Osteomyelitis of left foot ([place]) 08/19/2013  . Spinal stenosis, lumbar region, with neurogenic claudication 06/12/2012    Class: Diagnosis of  . DIABETES MELLITUS, TYPE II 03/15/2009  . ALCOHOL ABUSE 03/15/2009  . TOBACCO USE 03/15/2009  . HYPERTENSION 03/15/2009    Jamey Reas, PT, DPT 02/01/2021, 12:11 PM  [facility] [address] [place], [place], [address] Phone: [phone]   Fax:  [phone]  Name: GRASON BRAILSFORD MRN: [id number] Date of Birth: 06/07/1961

## 2021-02-02 DIAGNOSIS — D631 Anemia in chronic kidney disease: Secondary | ICD-10-CM | POA: Diagnosis not present

## 2021-02-02 DIAGNOSIS — D689 Coagulation defect, unspecified: Secondary | ICD-10-CM | POA: Diagnosis not present

## 2021-02-02 DIAGNOSIS — D509 Iron deficiency anemia, unspecified: Secondary | ICD-10-CM | POA: Diagnosis not present

## 2021-02-02 DIAGNOSIS — T8249XA Other complication of vascular dialysis catheter, initial encounter: Secondary | ICD-10-CM | POA: Diagnosis not present

## 2021-02-02 DIAGNOSIS — Z992 Dependence on renal dialysis: Secondary | ICD-10-CM | POA: Diagnosis not present

## 2021-02-02 DIAGNOSIS — N186 End stage renal disease: Secondary | ICD-10-CM | POA: Diagnosis not present

## 2021-02-02 DIAGNOSIS — E0821 Diabetes mellitus due to underlying condition with diabetic nephropathy: Secondary | ICD-10-CM | POA: Diagnosis not present

## 2021-02-02 DIAGNOSIS — N2581 Secondary hyperparathyroidism of renal origin: Secondary | ICD-10-CM | POA: Diagnosis not present

## 2021-02-04 DIAGNOSIS — N2581 Secondary hyperparathyroidism of renal origin: Secondary | ICD-10-CM | POA: Diagnosis not present

## 2021-02-04 DIAGNOSIS — D689 Coagulation defect, unspecified: Secondary | ICD-10-CM | POA: Diagnosis not present

## 2021-02-04 DIAGNOSIS — T8249XA Other complication of vascular dialysis catheter, initial encounter: Secondary | ICD-10-CM | POA: Diagnosis not present

## 2021-02-04 DIAGNOSIS — Z992 Dependence on renal dialysis: Secondary | ICD-10-CM | POA: Diagnosis not present

## 2021-02-04 DIAGNOSIS — E0821 Diabetes mellitus due to underlying condition with diabetic nephropathy: Secondary | ICD-10-CM | POA: Diagnosis not present

## 2021-02-04 DIAGNOSIS — N186 End stage renal disease: Secondary | ICD-10-CM | POA: Diagnosis not present

## 2021-02-04 DIAGNOSIS — D509 Iron deficiency anemia, unspecified: Secondary | ICD-10-CM | POA: Diagnosis not present

## 2021-02-04 DIAGNOSIS — D631 Anemia in chronic kidney disease: Secondary | ICD-10-CM | POA: Diagnosis not present

## 2021-02-06 ENCOUNTER — Encounter: Payer: Self-pay | Admitting: Physical Therapy

## 2021-02-06 ENCOUNTER — Ambulatory Visit (INDEPENDENT_AMBULATORY_CARE_PROVIDER_SITE_OTHER): Payer: Medicare Other | Admitting: Physical Therapy

## 2021-02-06 ENCOUNTER — Other Ambulatory Visit: Payer: Self-pay

## 2021-02-06 DIAGNOSIS — R293 Abnormal posture: Secondary | ICD-10-CM

## 2021-02-06 DIAGNOSIS — M21372 Foot drop, left foot: Secondary | ICD-10-CM | POA: Diagnosis not present

## 2021-02-06 DIAGNOSIS — R2689 Other abnormalities of gait and mobility: Secondary | ICD-10-CM | POA: Diagnosis not present

## 2021-02-06 DIAGNOSIS — R2681 Unsteadiness on feet: Secondary | ICD-10-CM

## 2021-02-06 NOTE — Therapy (Signed)
[facility] [address] [place], [place], [address] Phone: [phone]   Fax:  [phone]  Physical Therapy Treatment  Patient Details  Name: Alexander [name] MRN: [id number] Date of Birth: Sep 25, 1961 Referring Provider (PT): Meridee Score, MD   Encounter Date: 02/06/2021   PT End of Session - 02/06/21 0853    Visit Number 10    Number of Visits 25    Date for PT Re-Evaluation 04/04/21    Authorization Type BCBS Medicare    Authorization Time Period $40 co-pay    PT Start Time 0848    PT Stop Time 0930    PT Time Calculation (min) 42 min    Equipment Utilized During Treatment Gait belt    Activity Tolerance Patient tolerated treatment well    Behavior During Therapy [facility] for tasks assessed/performed           Past Medical History:  Diagnosis Date  . Alcohol abuse   . Anemia   . Arthritis    "patient does not think so."  . Atrial fibrillation ([place])   . Cardiac syncope 10/07/14   rapid A fib with 8 sec pauses on converison with syncope- temp pacing wire placed then PPM  . Cataract    BILATERAL  . Chronic kidney disease   . Coronary artery disease   . Diabetes mellitus    dx---been  awhile  . Encounter for care of pacemaker 10/08/2014  . History of blood transfusion   . Hyperlipemia   . Hypertension   . Osteomyelitis ([place])    right foot  . Peripheral vascular disease ([place])   . Presence of permanent cardiac pacemaker 10/08/2014  . Sinus node dysfunction ([place]) 10/08/2014    Past Surgical History:  Procedure Laterality Date  . ABDOMINAL AORTOGRAM W/LOWER EXTREMITY N/A 09/09/2017   Procedure: ABDOMINAL AORTOGRAM W/LOWER EXTREMITY;  Surgeon: Angelia Mould, MD;  Location: [place] [facility];  Service: Cardiovascular;  Laterality: N/A;  . ABDOMINAL AORTOGRAM W/LOWER EXTREMITY Right 11/11/2019   Procedure: ABDOMINAL AORTOGRAM W/LOWER EXTREMITY;  Surgeon: Marty Heck, MD;  Location: [place] [facility];  Service:  Cardiovascular;  Laterality: Right;  . AMPUTATION Left 08/19/2013   Procedure: AMPUTATION RAY;  Surgeon: Alta Corning, MD;  Location: [place];  Service: Orthopedics;  Laterality: Left;  ray amputation left 5th  . AMPUTATION Left 10/27/2013   Procedure: AMPUTATION DIGIT-LEFT 4TH TOE, 4th and 5th metatarsal.;  Surgeon: Angelia Mould, MD;  Location: [place];  Service: Vascular;  Laterality: Left;  . AMPUTATION Left 11/04/2013   Procedure: LEFT FOOT TRANS-METATARSAL AMPUTATION WITH WOUND CLOSURE ;  Surgeon: Alta Corning, MD;  Location: [place];  Service: Orthopedics;  Laterality: Left;  . AMPUTATION Right 09/10/2017   Procedure: RIGHT FOURTH AND FIFTH TOE AMPUTATION;  Surgeon: Angelia Mould, MD;  Location: [place];  Service: Vascular;  Laterality: Right;  . AMPUTATION Right 02/19/2020   Procedure: AMPUTATION RIGHT 3RD TOE;  Surgeon: Angelia Mould, MD;  Location: [place];  Service: Vascular;  Laterality: Right;  . AMPUTATION Right 03/11/2020   Procedure: RIGHT BELOW KNEE AMPUTATION;  Surgeon: Newt Minion, MD;  Location: [place];  Service: Orthopedics;  Laterality: Right;  . APPLICATION OF WOUND VAC Right 02/19/2020   Procedure: Application Of Wound Vac Right foot;  Surgeon: Angelia Mould, MD;  Location: [place];  Service: Vascular;  Laterality: Right;  . BIOPSY  07/20/2020   Procedure: BIOPSY;  Surgeon: Rush Landmark Telford Nab., MD;  Location:  [place] [facility];  Service: Gastroenterology;;  . CARDIAC CATHETERIZATION  10/07/2014   Procedure: LEFT HEART CATH AND CORONARY ANGIOGRAPHY;  Surgeon: Laverda Page, MD;  Location: [facility];  Service: Cardiovascular;;  . COLONOSCOPY W/ POLYPECTOMY    . COLONOSCOPY WITH PROPOFOL N/A 05/25/2020   Procedure: COLONOSCOPY WITH PROPOFOL;  Surgeon: Gatha Mayer, MD;  Location: [facility];  Service: Endoscopy;  Laterality: N/A;  . EMBOLECTOMY Right 09/12/2017   Procedure: Thrombectomy  & Redo Right Below Knee Popliteal Artery Bypass Graft  ;   Surgeon: Waynetta Sandy, MD;  Location: [place];  Service: Vascular;  Laterality: Right;  . ENTEROSCOPY N/A 07/20/2020   Procedure: ENTEROSCOPY;  Surgeon: Mansouraty, Telford Nab., MD;  Location: [place];  Service: Gastroenterology;  Laterality: N/A;  . ESOPHAGOGASTRODUODENOSCOPY (EGD) WITH PROPOFOL N/A 05/24/2020   Procedure: ESOPHAGOGASTRODUODENOSCOPY (EGD) WITH PROPOFOL;  Surgeon: Ladene Artist, MD;  Location: [facility];  Service: Endoscopy;  Laterality: N/A;  . EYE SURGERY Bilateral    cataract  . FEMORAL-TIBIAL BYPASS GRAFT Left 10/27/2013   Procedure: BYPASS GRAFT LEFT FEMORAL- POSTERIOR TIBIAL ARTERY;  Surgeon: Angelia Mould, MD;  Location: [place];  Service: Vascular;  Laterality: Left;  . FEMORAL-TIBIAL BYPASS GRAFT Right 09/10/2017   Procedure: RIGHT SUPERFICIAL  FEMORAL ARTERY-BELOW KNEE POPLITEAL ARTERY BYPASS GRAFT WITH VEIN;  Surgeon: Angelia Mould, MD;  Location: [place];  Service: Vascular;  Laterality: Right;  . GIVENS CAPSULE STUDY N/A 07/20/2020   Procedure: GIVENS CAPSULE STUDY;  Surgeon: Irving Copas., MD;  Location: [place];  Service: Gastroenterology;  Laterality: N/A;  capsule placed through scope into duodenum @ 0936  . I & D EXTREMITY Left 10/27/2013   Procedure: IRRIGATION AND DEBRIDEMENT EXTREMITY- LEFT FOOT;  Surgeon: Angelia Mould, MD;  Location: [place];  Service: Vascular;  Laterality: Left;  . I & D EXTREMITY Right 01/26/2020   Procedure: IRRIGATION AND DEBRIDEMENT EXTREMITY RIGHT FOOT WOUND;  Surgeon: Angelia Mould, MD;  Location: [place];  Service: Vascular;  Laterality: Right;  . INTRAOPERATIVE ARTERIOGRAM Right 09/10/2017   Procedure: INTRA OPERATIVE ARTERIOGRAM;  Surgeon: Angelia Mould, MD;  Location: [place];  Service: Vascular;  Laterality: Right;  . IR ANGIOGRAM FOLLOW UP STUDY  09/09/2017  . IR FLUORO GUIDE CV LINE RIGHT  05/31/2020  . IR US GUIDE VASC ACCESS RIGHT  06/01/2020  . LEFT HEART CATH  AND CORONARY ANGIOGRAPHY N/A 08/19/2020   Procedure: LEFT HEART CATH AND CORONARY ANGIOGRAPHY;  Surgeon: Adrian Prows, MD;  Location: [place] [facility];  Service: Cardiovascular;  Laterality: N/A;  . LEFT HEART CATHETERIZATION WITH CORONARY ANGIOGRAM N/A 11/09/2014   Procedure: LEFT HEART CATHETERIZATION WITH CORONARY ANGIOGRAM;  Surgeon: Laverda Page, MD;  Location: [facility] CATH LAB;  Service: Cardiovascular;  Laterality: N/A;  . LOWER EXTREMITY ANGIOGRAM Right 11/08/2015   Procedure: Lower Extremity Angiogram;  Surgeon: Serafina Mitchell, MD;  Location: [place] [facility];  Service: Cardiovascular;  Laterality: Right;  . LUMBAR LAMINECTOMY  06/13/2012   Procedure: MICRODISCECTOMY LUMBAR LAMINECTOMY;  Surgeon: Jessy Oto, MD;  Location: [place];  Service: Orthopedics;  Laterality: N/A;  Central laminectomy L2-3, L3-4, L4-5  . PACEMAKER INSERTION  10/08/14   MDT Advisa MRI compatible dual chamber pacemaker implanted by Dr Caryl Comes for syncope with post-termination pauses  . PERCUTANEOUS CORONARY STENT INTERVENTION (PCI-S)  11/09/2014   des to lad & distal circumflex         Dr  [name]  . PERIPHERAL VASCULAR  BALLOON ANGIOPLASTY Right 11/11/2019   Procedure: PERIPHERAL VASCULAR BALLOON ANGIOPLASTY;  Surgeon: Marty Heck, MD;  Location: [place] [facility];  Service: Cardiovascular;  Laterality: Right;  below knee popliteal, tibioperoneal trunk, posterior tibial arteries  . PERIPHERAL VASCULAR CATHETERIZATION N/A 11/08/2015   Procedure: Abdominal Aortogram;  Surgeon: Serafina Mitchell, MD;  Location: [place] [facility];  Service: Cardiovascular;  Laterality: N/A;  . PERIPHERAL VASCULAR CATHETERIZATION Right 11/08/2015   Procedure: Peripheral Vascular Atherectomy;  Surgeon: Serafina Mitchell, MD;  Location: [place] [facility];  Service: Cardiovascular;  Laterality: Right;  . PERIPHERAL VASCULAR CATHETERIZATION N/A 10/10/2016   Procedure: Lower Extremity Angiography;  Surgeon: Waynetta Sandy, MD;   Location: [place] [facility];  Service: Cardiovascular;  Laterality: N/A;  . PERIPHERAL VASCULAR CATHETERIZATION Right 10/10/2016   Procedure: Peripheral Vascular Atherectomy;  Surgeon: Waynetta Sandy, MD;  Location: [place] [facility];  Service: Cardiovascular;  Laterality: Right;  Popliteal  . PERMANENT PACEMAKER INSERTION N/A 10/08/2014   Procedure: PERMANENT PACEMAKER INSERTION;  Surgeon: Deboraha Sprang, MD;  Location: [facility] CATH LAB;  Service: Cardiovascular;  Laterality: N/A;  . SUBMUCOSAL TATTOO INJECTION  07/20/2020   Procedure: SUBMUCOSAL TATTOO INJECTION;  Surgeon: Irving Copas., MD;  Location: [place];  Service: Gastroenterology;;  . TEMPORARY PACEMAKER INSERTION Bilateral 10/07/2014   Procedure: TEMPORARY PACEMAKER INSERTION;  Surgeon: Laverda Page, MD;  Location: [facility] CATH LAB;  Service: Cardiovascular;  Laterality: Bilateral;  . WOUND DEBRIDEMENT Right 02/19/2020   Procedure: DEBRIDEMENT WOUND RIGHT FOOT;  Surgeon: Angelia Mould, MD;  Location: [place];  Service: Vascular;  Laterality: Right;    There were no vitals filed for this visit.   Subjective Assessment - 02/06/21 0848    Subjective He has not worn prosthesis to dialysis yet. He has wears prosthesis all awake hours & drying prn.    Pertinent History Rt TTA, artthritis, A-Fib, cardiac syncope, cataract, CKD, CAD, DM, ESRD w/HD since 7/21, HTN, PVD, cardiac pacemaker,    Limitations Walking;House hold activities    Patient Stated Goals to use prosthesis to walk in community, to hunt & fish, yardwork & garden    Currently in Pain? No/denies                             [facility] Adult PT Treatment/Exercise - 02/06/21 0848      Transfers   Transfers Sit to Stand;Stand to Sit    Sit to Stand 5: Supervision;With upper extremity assist;With armrests;From chair/3-in-1;Other (comment)   requires RW support for stabilization   Stand to Sit 5: Supervision;With upper extremity  assist;With armrests;To chair/3-in-1;Other (comment)   requires RW for stabilization     Ambulation/Gait   Ambulation/Gait Yes    Ambulation/Gait Assistance 3: Mod assist;5: Supervision   ModA cane & supervision RW   Ambulation/Gait Assistance Details verbal & manual / tactile cues on balance reactions, upright posture.    Ambulation Distance (Feet) 50 Feet   50' X 2 w/cane   Assistive device Prosthesis;Straight cane;1 person hand held assist   straight cane stand alone tip   Ambulation Surface Level;Indoor    Ramp 4: Min assist   RW & TTA prosthesis   Ramp Details (indicate cue type and reason) verbal & tactile cues on technique    Curb 4: Min assist   RW   Curb Details (indicate cue type and reason) verbal & tactile cues on technique    Gait  Comments PT & pt discussed options for negotiating 2 steps at entrance to home.  Since he is not heavily weight bearing thru RW he may be able to place RW up or down to flat areas using RW for balance & stability      High Level Balance   High Level Balance Activities --    High Level Balance Comments --      Exercises   Exercises Knee/Hip      Knee/Hip Exercises: Stretches   Active Hamstring Stretch Right;2 reps;20 seconds    Active Hamstring Stretch Limitations SLR with strap DF      Knee/Hip Exercises: Aerobic   Nustep seat 11 level 7 with BLEs & BUEs 8 min      Knee/Hip Exercises: Machines for Strengthening   Cybex Leg Press shuttle leg press BLEs 112# 15 reps 2 sets      Knee/Hip Exercises: Standing   Forward Step Up Right;Left;1 set;10 reps;Hand Hold: 2;Step Height: 6"    Forward Step Up Limitations verbal cues on technique    Step Down Right;Left;1 set;10 reps;Hand Hold: 2;Step Height: 6"    Step Down Limitations verbal cues on technique    Rocker Board 1 minute   round board, ant/mid/post & right/mid/left,   Rocker Board Limitations mirror for visual upright, verbal cues on control, BUE support on //bars      Prosthetics    Prosthetic Care Comments  PT educated verbally on limb pain vs phantom sensation vs phantom pain. PT also continues to encourage short, medium & 1 long walk / day to increase activity tolerance.    Current prosthetic wear tolerance (days/week)  daily    Current prosthetic wear tolerance (#hours/day)  nondialysis days & after dialysis all awake hours except 2 hrs midday    Current prosthetic weight-bearing tolerance (hours/day)  Patient tolerated standing & gait with partial weight on prosthesis for 5 minutes with no c/o limb pain or discomfort.    Edema Pitting edema with 5-7 sec capillary refill    Residual limb condition  No open areas, normal color, tempertaure & moisture, shape cylinderical    Education Provided Correct ply sock adjustment;Proper Donning;Proper wear schedule/adjustment;Other (comment)   see prosthetic care comments   Person(s) Educated Patient    Education Method Explanation;Verbal cues    Education Method Verbalized understanding;Verbal cues required;Needs further instruction    Donning Prosthesis Modified independent (device/increased time)                    PT Short Term Goals - 02/01/21 1207      PT SHORT TERM GOAL #1   Title Patient verbalizes proper weighing & prosthetic issues with dialysis.    Time 1    Period Months    Status New    Target Date 03/03/21      PT SHORT TERM GOAL #2   Title Pateint tolerates prosthesis wear >90% of awake hours / day with no skin issues.    Time 1    Period Months    Status Revised    Target Date 03/03/21      PT SHORT TERM GOAL #3   Title Pateint is able to reach 10" anteriorly & pick up item from floor with cane safely.    Time 1    Period Months    Status Revised    Target Date 03/03/21      PT SHORT TERM GOAL #4   Title Pateint ambulates 300' with RW & prosthesis  modified independent.    Time 1    Period Months    Status Revised    Target Date 03/03/21      PT SHORT TERM GOAL #5   Title Patient  negotiates ramps & curbs with RW & prosthesis with supervision    Time 1    Period Months    Status Revised    Target Date 03/03/21      Additional Short Term Goals   Additional Short Term Goals Yes      PT SHORT TERM GOAL #6   Title Patient ambulates 25' with cane & prosthesis with minA    Time 4    Period Weeks    Status New    Target Date 03/03/21             PT Long Term Goals - 01/18/21 0941      PT LONG TERM GOAL #1   Title Patient verbalizes & demonstrates understanding of prosthetic care to enable safe utilization of prosthesis.    Time 3    Period Months    Status On-going    Target Date 04/03/21      PT LONG TERM GOAL #2   Title Patient tolerates wear of prosthesis >90% of awake hours without skin or limb pain issues to enable function throughout his day.    Time 3    Period Months    Status On-going    Target Date 04/03/21      PT LONG TERM GOAL #3   Title Task of [place] Balance with RW  >/= 45/56    Time 3    Period Months    Status On-going    Target Date 04/03/21      PT LONG TERM GOAL #4   Title Patient ambulates >400' with LRAD & prosthesis modified independent.    Time 3    Period Months    Status On-going    Target Date 04/03/21      PT LONG TERM GOAL #5   Title Patient negotiates ramps, curbs & stairs with LRAD & prosthesis modified independent.    Time 3    Period Months    Status On-going    Target Date 04/03/21                 Plan - 02/06/21 0855    Clinical Impression Statement Patient is slowly progressing but limited activity tolerance from deconditioning with prolonged limited activity and complex medical issues including dialysis.    Personal Factors and Comorbidities Comorbidity 3+;Fitness;Time since onset of injury/illness/exacerbation;Past/Current Experience    Comorbidities Rt TTA, left Transmetatarsal Amputation, artthritis, A-Fib, cardiac syncope, cataract, CKD, CAD, DM, ESRD w/HD since 7/21, HTN, PVD, cardiac  pacemaker,    Examination-Activity Limitations Locomotion Level;Squat;Stairs;Stand;Toileting;Transfers    Examination-Participation Restrictions Community Activity    Stability/Clinical Decision Making Evolving/Moderate complexity    Rehab Potential Good    PT Frequency 2x / week    PT Duration 12 weeks    PT Treatment/Interventions ADLs/Self Care Home Management;DME Instruction;Gait training;Stair training;Functional mobility training;Therapeutic activities;Therapeutic exercise;Balance training;Neuromuscular re-education;Patient/family education;Prosthetic Training;Vestibular    PT Next Visit Plan review prosthetic care, prosthetic gait household level with cane & community level with RW including ramps & curbs. standing balance & strength    Consulted and Agree with Plan of Care Patient           Patient will benefit from skilled therapeutic intervention in order to improve the following deficits and impairments:  Abnormal gait,Cardiopulmonary  status limiting activity,Decreased activity tolerance,Decreased balance,Decreased endurance,Decreased knowledge of use of DME,Decreased mobility,Decreased scar mobility,Decreased strength,Increased edema,Impaired flexibility,Postural dysfunction,Prosthetic Dependency,Obesity  Visit Diagnosis: Unsteadiness on feet  Abnormal posture  Foot drop, left  Other abnormalities of gait and mobility     Problem List Patient Active Problem List   Diagnosis Date Noted  . NSVT (nonsustained ventricular tachycardia) ([place])   . Pancytopenia ([place])   . Acute blood loss anemia 07/18/2020  . Hemodialysis patient ([place]) 06/04/2020  . Necrotizing glomerulonephritis 06/04/2020  . Heme positive stool   . Melena   . Upper GI bleeding 05/23/2020  . Acute GI bleeding 05/23/2020  . Acute hematogenous osteomyelitis of right foot ([place]) 03/11/2020  . Gangrene of right foot ([place])   . Diabetic foot ulcer associated with type 2 diabetes mellitus ([place]) 02/19/2020  .  Heel ulcer ([place]) 11/05/2019  . Atherosclerotic PVD with ulceration ([place]) 09/09/2017  . Essential hypertension, benign 02/07/2015  . Renal insufficiency 02/07/2015  . Post PTCA 11/09/2014  . S/P PTCA (percutaneous transluminal coronary angioplasty) 11/09/2014  . Pacemaker: [place] DR MRI [id number] Dual chamber pacemaker 10/08/2014 10/08/2014  . Encounter for care of pacemaker 10/08/2014  . Sinus node dysfunction ([place]) 10/08/2014  . Cardiac asystole ([place]) 10/07/2014  . Cardiac syncope 10/07/2014  . Diabetes mellitus with stage 3 chronic kidney disease ([place]) 10/07/2014  . CAD (coronary artery disease), native coronary artery 10/07/2014  . Diabetes mellitus due to underlying condition without complications ([place]) 95/18/8416  . Anemia, unspecified 05/25/2014  . Syncope 04/30/2014  . Atherosclerosis of native arteries of the extremities with gangrene ([place]) 12/16/2013  . Volume overload 10/30/2013  . Acute renal failure ([place]) 10/28/2013  . PAD (peripheral artery disease) ([place]) 10/27/2013  . Leukocytosis 10/21/2013  . Anemia 10/21/2013  . Osteomyelitis of left foot ([place]) 08/19/2013  . Spinal stenosis, lumbar region, with neurogenic claudication 06/12/2012    Class: Diagnosis of  . DIABETES MELLITUS, TYPE II 03/15/2009  . ALCOHOL ABUSE 03/15/2009  . TOBACCO USE 03/15/2009  . HYPERTENSION 03/15/2009    Jamey Reas, PT, DPT 02/06/2021, 12:20 PM  [facility] [address] [place], [place], [address] Phone: [phone]   Fax:  [phone]  Name: Alexander [name] MRN: [id number] Date of Birth: [birth year]

## 2021-02-07 DIAGNOSIS — D689 Coagulation defect, unspecified: Secondary | ICD-10-CM | POA: Diagnosis not present

## 2021-02-07 DIAGNOSIS — D631 Anemia in chronic kidney disease: Secondary | ICD-10-CM | POA: Diagnosis not present

## 2021-02-07 DIAGNOSIS — E0821 Diabetes mellitus due to underlying condition with diabetic nephropathy: Secondary | ICD-10-CM | POA: Diagnosis not present

## 2021-02-07 DIAGNOSIS — D509 Iron deficiency anemia, unspecified: Secondary | ICD-10-CM | POA: Diagnosis not present

## 2021-02-07 DIAGNOSIS — N186 End stage renal disease: Secondary | ICD-10-CM | POA: Diagnosis not present

## 2021-02-07 DIAGNOSIS — N2581 Secondary hyperparathyroidism of renal origin: Secondary | ICD-10-CM | POA: Diagnosis not present

## 2021-02-07 DIAGNOSIS — T8249XA Other complication of vascular dialysis catheter, initial encounter: Secondary | ICD-10-CM | POA: Diagnosis not present

## 2021-02-07 DIAGNOSIS — Z992 Dependence on renal dialysis: Secondary | ICD-10-CM | POA: Diagnosis not present

## 2021-02-08 ENCOUNTER — Encounter: Payer: Self-pay | Admitting: Physical Therapy

## 2021-02-08 ENCOUNTER — Ambulatory Visit (INDEPENDENT_AMBULATORY_CARE_PROVIDER_SITE_OTHER): Payer: Medicare Other | Admitting: Physical Therapy

## 2021-02-08 ENCOUNTER — Other Ambulatory Visit: Payer: Self-pay

## 2021-02-08 DIAGNOSIS — R293 Abnormal posture: Secondary | ICD-10-CM | POA: Diagnosis not present

## 2021-02-08 DIAGNOSIS — M21372 Foot drop, left foot: Secondary | ICD-10-CM | POA: Diagnosis not present

## 2021-02-08 DIAGNOSIS — R2681 Unsteadiness on feet: Secondary | ICD-10-CM | POA: Diagnosis not present

## 2021-02-08 DIAGNOSIS — R2689 Other abnormalities of gait and mobility: Secondary | ICD-10-CM | POA: Diagnosis not present

## 2021-02-08 NOTE — Therapy (Signed)
[facility] Physical Therapy [address] [place], [place], [address] Phone: [phone]   Fax:  [phone]  Physical Therapy Treatment  Patient Details  Name: Alexander [name] MRN: [id number] Date of Birth: Jun 05, 1961 Referring Provider (PT): Meridee Score, MD   Encounter Date: 02/08/2021   PT End of Session - 02/08/21 0850    Visit Number 11    Number of Visits 25    Date for PT Re-Evaluation 04/04/21    Authorization Type BCBS Medicare    Authorization Time Period $40 co-pay    PT Start Time [id number]    PT Stop Time 0930    PT Time Calculation (min) 43 min    Equipment Utilized During Treatment Gait belt    Activity Tolerance Patient tolerated treatment well    Behavior During Therapy [facility] for tasks assessed/performed           Past Medical History:  Diagnosis Date  . Alcohol abuse   . Anemia   . Arthritis    "patient does not think so."  . Atrial fibrillation ([place])   . Cardiac syncope 10/07/14   rapid A fib with 8 sec pauses on converison with syncope- temp pacing wire placed then PPM  . Cataract    BILATERAL  . Chronic kidney disease   . Coronary artery disease   . Diabetes mellitus    dx---been  awhile  . Encounter for care of pacemaker 10/08/2014  . History of blood transfusion   . Hyperlipemia   . Hypertension   . Osteomyelitis ([place])    right foot  . Peripheral vascular disease ([place])   . Presence of permanent cardiac pacemaker 10/08/2014  . Sinus node dysfunction ([place]) 10/08/2014    Past Surgical History:  Procedure Laterality Date  . ABDOMINAL AORTOGRAM W/LOWER EXTREMITY N/A 09/09/2017   Procedure: ABDOMINAL AORTOGRAM W/LOWER EXTREMITY;  Surgeon: Angelia Mould, MD;  Location: [place] [facility];  Service: Cardiovascular;  Laterality: N/A;  . ABDOMINAL AORTOGRAM W/LOWER EXTREMITY Right 11/11/2019   Procedure: ABDOMINAL AORTOGRAM W/LOWER EXTREMITY;  Surgeon: Marty Heck, MD;  Location: [place] [facility];  Service:  Cardiovascular;  Laterality: Right;  . AMPUTATION Left 08/19/2013   Procedure: AMPUTATION RAY;  Surgeon: Alta Corning, MD;  Location: [place];  Service: Orthopedics;  Laterality: Left;  ray amputation left 5th  . AMPUTATION Left 10/27/2013   Procedure: AMPUTATION DIGIT-LEFT 4TH TOE, 4th and 5th metatarsal.;  Surgeon: Angelia Mould, MD;  Location: [place];  Service: Vascular;  Laterality: Left;  . AMPUTATION Left 11/04/2013   Procedure: LEFT FOOT TRANS-METATARSAL AMPUTATION WITH WOUND CLOSURE ;  Surgeon: Alta Corning, MD;  Location: [place];  Service: Orthopedics;  Laterality: Left;  . AMPUTATION Right 09/10/2017   Procedure: RIGHT FOURTH AND FIFTH TOE AMPUTATION;  Surgeon: Angelia Mould, MD;  Location: [place];  Service: Vascular;  Laterality: Right;  . AMPUTATION Right 02/19/2020   Procedure: AMPUTATION RIGHT 3RD TOE;  Surgeon: Angelia Mould, MD;  Location: [place];  Service: Vascular;  Laterality: Right;  . AMPUTATION Right 03/11/2020   Procedure: RIGHT BELOW KNEE AMPUTATION;  Surgeon: Newt Minion, MD;  Location: [place];  Service: Orthopedics;  Laterality: Right;  . APPLICATION OF WOUND VAC Right 02/19/2020   Procedure: Application Of Wound Vac Right foot;  Surgeon: Angelia Mould, MD;  Location: [place];  Service: Vascular;  Laterality: Right;  . BIOPSY  07/20/2020   Procedure: BIOPSY;  Surgeon: Rush Landmark Telford Nab., MD;  Location:  [place] [facility];  Service: Gastroenterology;;  . CARDIAC CATHETERIZATION  10/07/2014   Procedure: LEFT HEART CATH AND CORONARY ANGIOGRAPHY;  Surgeon: Laverda Page, MD;  Location: [facility];  Service: Cardiovascular;;  . COLONOSCOPY W/ POLYPECTOMY    . COLONOSCOPY WITH PROPOFOL N/A 05/25/2020   Procedure: COLONOSCOPY WITH PROPOFOL;  Surgeon: Gatha Mayer, MD;  Location: [facility];  Service: Endoscopy;  Laterality: N/A;  . EMBOLECTOMY Right 09/12/2017   Procedure: Thrombectomy  & Redo Right Below Knee Popliteal Artery Bypass Graft  ;   Surgeon: Waynetta Sandy, MD;  Location: [place];  Service: Vascular;  Laterality: Right;  . ENTEROSCOPY N/A 07/20/2020   Procedure: ENTEROSCOPY;  Surgeon: Mansouraty, Telford Nab., MD;  Location: [place];  Service: Gastroenterology;  Laterality: N/A;  . ESOPHAGOGASTRODUODENOSCOPY (EGD) WITH PROPOFOL N/A 05/24/2020   Procedure: ESOPHAGOGASTRODUODENOSCOPY (EGD) WITH PROPOFOL;  Surgeon: Ladene Artist, MD;  Location: [facility];  Service: Endoscopy;  Laterality: N/A;  . EYE SURGERY Bilateral    cataract  . FEMORAL-TIBIAL BYPASS GRAFT Left 10/27/2013   Procedure: BYPASS GRAFT LEFT FEMORAL- POSTERIOR TIBIAL ARTERY;  Surgeon: Angelia Mould, MD;  Location: [place];  Service: Vascular;  Laterality: Left;  . FEMORAL-TIBIAL BYPASS GRAFT Right 09/10/2017   Procedure: RIGHT SUPERFICIAL  FEMORAL ARTERY-BELOW KNEE POPLITEAL ARTERY BYPASS GRAFT WITH VEIN;  Surgeon: Angelia Mould, MD;  Location: [place];  Service: Vascular;  Laterality: Right;  . GIVENS CAPSULE STUDY N/A 07/20/2020   Procedure: GIVENS CAPSULE STUDY;  Surgeon: Irving Copas., MD;  Location: [place];  Service: Gastroenterology;  Laterality: N/A;  capsule placed through scope into duodenum @ 0936  . I & D EXTREMITY Left 10/27/2013   Procedure: IRRIGATION AND DEBRIDEMENT EXTREMITY- LEFT FOOT;  Surgeon: Angelia Mould, MD;  Location: [place];  Service: Vascular;  Laterality: Left;  . I & D EXTREMITY Right 01/26/2020   Procedure: IRRIGATION AND DEBRIDEMENT EXTREMITY RIGHT FOOT WOUND;  Surgeon: Angelia Mould, MD;  Location: [place];  Service: Vascular;  Laterality: Right;  . INTRAOPERATIVE ARTERIOGRAM Right 09/10/2017   Procedure: INTRA OPERATIVE ARTERIOGRAM;  Surgeon: Angelia Mould, MD;  Location: [place];  Service: Vascular;  Laterality: Right;  . IR ANGIOGRAM FOLLOW UP STUDY  09/09/2017  . IR FLUORO GUIDE CV LINE RIGHT  05/31/2020  . IR US GUIDE VASC ACCESS RIGHT  06/01/2020  . LEFT HEART CATH  AND CORONARY ANGIOGRAPHY N/A 08/19/2020   Procedure: LEFT HEART CATH AND CORONARY ANGIOGRAPHY;  Surgeon: Adrian Prows, MD;  Location: [place] [facility];  Service: Cardiovascular;  Laterality: N/A;  . LEFT HEART CATHETERIZATION WITH CORONARY ANGIOGRAM N/A 11/09/2014   Procedure: LEFT HEART CATHETERIZATION WITH CORONARY ANGIOGRAM;  Surgeon: Laverda Page, MD;  Location: [facility] CATH LAB;  Service: Cardiovascular;  Laterality: N/A;  . LOWER EXTREMITY ANGIOGRAM Right 11/08/2015   Procedure: Lower Extremity Angiogram;  Surgeon: Serafina Mitchell, MD;  Location: [place] [facility];  Service: Cardiovascular;  Laterality: Right;  . LUMBAR LAMINECTOMY  06/13/2012   Procedure: MICRODISCECTOMY LUMBAR LAMINECTOMY;  Surgeon: Jessy Oto, MD;  Location: [place];  Service: Orthopedics;  Laterality: N/A;  Central laminectomy L2-3, L3-4, L4-5  . PACEMAKER INSERTION  10/08/14   MDT Advisa MRI compatible dual chamber pacemaker implanted by Dr Caryl Comes for syncope with post-termination pauses  . PERCUTANEOUS CORONARY STENT INTERVENTION (PCI-S)  11/09/2014   des to lad & distal circumflex         Dr  [name]  . PERIPHERAL VASCULAR  BALLOON ANGIOPLASTY Right 11/11/2019   Procedure: PERIPHERAL VASCULAR BALLOON ANGIOPLASTY;  Surgeon: Marty Heck, MD;  Location: [place] [facility];  Service: Cardiovascular;  Laterality: Right;  below knee popliteal, tibioperoneal trunk, posterior tibial arteries  . PERIPHERAL VASCULAR CATHETERIZATION N/A 11/08/2015   Procedure: Abdominal Aortogram;  Surgeon: Serafina Mitchell, MD;  Location: [place] [facility];  Service: Cardiovascular;  Laterality: N/A;  . PERIPHERAL VASCULAR CATHETERIZATION Right 11/08/2015   Procedure: Peripheral Vascular Atherectomy;  Surgeon: Serafina Mitchell, MD;  Location: [place] [facility];  Service: Cardiovascular;  Laterality: Right;  . PERIPHERAL VASCULAR CATHETERIZATION N/A 10/10/2016   Procedure: Lower Extremity Angiography;  Surgeon: Waynetta Sandy, MD;   Location: [place] [facility];  Service: Cardiovascular;  Laterality: N/A;  . PERIPHERAL VASCULAR CATHETERIZATION Right 10/10/2016   Procedure: Peripheral Vascular Atherectomy;  Surgeon: Waynetta Sandy, MD;  Location: [place] [facility];  Service: Cardiovascular;  Laterality: Right;  Popliteal  . PERMANENT PACEMAKER INSERTION N/A 10/08/2014   Procedure: PERMANENT PACEMAKER INSERTION;  Surgeon: Deboraha Sprang, MD;  Location: [facility] CATH LAB;  Service: Cardiovascular;  Laterality: N/A;  . SUBMUCOSAL TATTOO INJECTION  07/20/2020   Procedure: SUBMUCOSAL TATTOO INJECTION;  Surgeon: Irving Copas., MD;  Location: [place];  Service: Gastroenterology;;  . TEMPORARY PACEMAKER INSERTION Bilateral 10/07/2014   Procedure: TEMPORARY PACEMAKER INSERTION;  Surgeon: Laverda Page, MD;  Location: [facility] C[facility] LAB;  Service: Cardiovascular;  Laterality: Bilateral;  . WOUND DEBRIDEMENT Right 02/19/2020   Procedure: DEBRIDEMENT WOUND RIGHT FOOT;  Surgeon: Angelia Mould, MD;  Location: [place];  Service: Vascular;  Laterality: Right;    There were no vitals filed for this visit.   Subjective Assessment - 02/08/21 0849    Subjective No new issues.    Pertinent History Rt TTA, artthritis, A-Fib, cardiac syncope, cataract, CKD, CAD, DM, ESRD w/HD since 7/21, HTN, PVD, cardiac pacemaker,    Limitations Walking;House hold activities    Patient Stated Goals to use prosthesis to walk in community, to hunt & fish, yardwork & garden    Currently in Pain? No/denies                             [facility] Adult PT Treatment/Exercise - 02/08/21 0847      Transfers   Transfers Sit to Stand;Stand to Sit    Sit to Stand 5: Supervision;With upper extremity assist;With armrests;From chair/3-in-1;Other (comment)   requires RW support for stabilization   Stand to Sit 5: Supervision;With upper extremity assist;With armrests;To chair/3-in-1;Other (comment)   requires RW for stabilization      Ambulation/Gait   Ambulation/Gait Yes    Ambulation/Gait Assistance 5: Supervision    Ambulation/Gait Assistance Details verbal & demo cues on RW control & proper step width    Ambulation Distance (Feet) 75 Feet   75' X 2   Assistive device Prosthesis;Rolling walker   straight cane stand alone tip   Ambulation Surface Level;Indoor    Ramp 4: Min assist   RW & TTA prosthesis   Ramp Details (indicate cue type and reason) verbal & tactile cues on technique    Curb 5: Supervision   RW   Curb Details (indicate cue type and reason) verbal & tactile cues on technique    Gait Comments --      Exercises   Exercises Knee/Hip      Knee/Hip Exercises: Stretches   Active Hamstring Stretch Right;2 reps;20 seconds  Active Hamstring Stretch Limitations SLR with strap DF      Knee/Hip Exercises: Aerobic   Nustep seat 11 level 7 with BLEs & BUEs 8 min      Knee/Hip Exercises: Machines for Strengthening   Cybex Leg Press shuttle leg press BLEs 112# 15 reps 2 sets      Knee/Hip Exercises: Standing   Forward Step Up Right;Left;1 set;10 reps;Hand Hold: 2;Step Height: 6"    Forward Step Up Limitations verbal cues on technique    Step Down Right;Left;1 set;10 reps;Hand Hold: 2;Step Height: 6"    Step Down Limitations verbal cues on technique    Rocker Board 1 minute   round board, ant/mid/post & right/mid/left,   Rocker Board Limitations mirror for visual upright, verbal cues on control, BUE support on //bars      Knee/Hip Exercises: Seated   Sit to Sand 1 set;10 reps;without UE support   from 24" bar stool     Prosthetics   Prosthetic Care Comments  Fall risk when prosthesis is off.    Current prosthetic wear tolerance (days/week)  daily    Current prosthetic wear tolerance (#hours/day)  nondialysis days & after dialysis all awake hours except 2 hrs midday    Current prosthetic weight-bearing tolerance (hours/day)  Patient tolerated standing & gait with partial weight on prosthesis for 5 minutes  with no c/o limb pain or discomfort.    Edema Pitting edema with 5-7 sec capillary refill    Residual limb condition  No open areas, normal color, tempertaure & moisture, shape cylinderical    Education Provided Correct ply sock adjustment;Proper Donning;Proper wear schedule/adjustment;Other (comment)   see prosthetic care comments   Person(s) Educated Patient    Education Method Explanation;Tactile cues;Verbal cues    Education Method Verbalized understanding;Tactile cues required;Verbal cues required;Needs further instruction                    PT Short Term Goals - 02/01/21 1207      PT SHORT TERM GOAL #1   Title Patient verbalizes proper weighing & prosthetic issues with dialysis.    Time 1    Period Months    Status New    Target Date 03/03/21      PT SHORT TERM GOAL #2   Title Pateint tolerates prosthesis wear >90% of awake hours / day with no skin issues.    Time 1    Period Months    Status Revised    Target Date 03/03/21      PT SHORT TERM GOAL #3   Title Pateint is able to reach 10" anteriorly & pick up item from floor with cane safely.    Time 1    Period Months    Status Revised    Target Date 03/03/21      PT SHORT TERM GOAL #4   Title Pateint ambulates 300' with RW & prosthesis modified independent.    Time 1    Period Months    Status Revised    Target Date 03/03/21      PT SHORT TERM GOAL #5   Title Patient negotiates ramps & curbs with RW & prosthesis with supervision    Time 1    Period Months    Status Revised    Target Date 03/03/21      Additional Short Term Goals   Additional Short Term Goals Yes      PT SHORT TERM GOAL #6   Title Patient ambulates 70' with  cane & prosthesis with minA    Time 4    Period Weeks    Status New    Target Date 03/03/21             PT Long Term Goals - 01/18/21 0941      PT LONG TERM GOAL #1   Title Patient verbalizes & demonstrates understanding of prosthetic care to enable safe utilization  of prosthesis.    Time 3    Period Months    Status On-going    Target Date 04/03/21      PT LONG TERM GOAL #2   Title Patient tolerates wear of prosthesis >90% of awake hours without skin or limb pain issues to enable function throughout his day.    Time 3    Period Months    Status On-going    Target Date 04/03/21      PT LONG TERM GOAL #3   Title Task of Berg Balance with RW  >/= 45/56    Time 3    Period Months    Status On-going    Target Date 04/03/21      PT LONG TERM GOAL #4   Title Patient ambulates >400' with LRAD & prosthesis modified independent.    Time 3    Period Months    Status On-going    Target Date 04/03/21      PT LONG TERM GOAL #5   Title Patient negotiates ramps, curbs & stairs with LRAD & prosthesis modified independent.    Time 3    Period Months    Status On-going    Target Date 04/03/21                 Plan - 02/08/21 3299    Clinical Impression Statement Patient is progressing towards STGs. His LEs continue to fatigue with activities limiting how much he can do before requiring a rest.    Personal Factors and Comorbidities Comorbidity 3+;Fitness;Time since onset of injury/illness/exacerbation;Past/Current Experience    Comorbidities Rt TTA, left Transmetatarsal Amputation, artthritis, A-Fib, cardiac syncope, cataract, CKD, CAD, DM, ESRD w/HD since 7/21, HTN, PVD, cardiac pacemaker,    Examination-Activity Limitations Locomotion Level;Squat;Stairs;Stand;Toileting;Transfers    Examination-Participation Restrictions Community Activity    Stability/Clinical Decision Making Evolving/Moderate complexity    Rehab Potential Good    PT Frequency 2x / week    PT Duration 12 weeks    PT Treatment/Interventions ADLs/Self Care Home Management;DME Instruction;Gait training;Stair training;Functional mobility training;Therapeutic activities;Therapeutic exercise;Balance training;Neuromuscular re-education;Patient/family education;Prosthetic  Training;Vestibular    PT Next Visit Plan review prosthetic care, prosthetic gait household level with cane & community level with RW including ramps & curbs. standing balance & strength    Consulted and Agree with Plan of Care Patient           Patient will benefit from skilled therapeutic intervention in order to improve the following deficits and impairments:  Abnormal gait,Cardiopulmonary status limiting activity,Decreased activity tolerance,Decreased balance,Decreased endurance,Decreased knowledge of use of DME,Decreased mobility,Decreased scar mobility,Decreased strength,Increased edema,Impaired flexibility,Postural dysfunction,Prosthetic Dependency,Obesity  Visit Diagnosis: Unsteadiness on feet  Abnormal posture  Foot drop, left  Other abnormalities of gait and mobility     Problem List Patient Active Problem List   Diagnosis Date Noted  . NSVT (nonsustained ventricular tachycardia) ([place])   . Pancytopenia ([place])   . Acute blood loss anemia 07/18/2020  . Hemodialysis patient ([place]) 06/04/2020  . Necrotizing glomerulonephritis 06/04/2020  . Heme positive stool   . Melena   . Upper  GI bleeding 05/23/2020  . Acute GI bleeding 05/23/2020  . Acute hematogenous osteomyelitis of right foot ([place]) 03/11/2020  . Gangrene of right foot ([place])   . Diabetic foot ulcer associated with type 2 diabetes mellitus ([place]) 02/19/2020  . Heel ulcer ([place]) 11/05/2019  . Atherosclerotic PVD with ulceration ([place]) 09/09/2017  . Essential hypertension, benign 02/07/2015  . Renal insufficiency 02/07/2015  . Post PTCA 11/09/2014  . S/P PTCA (percutaneous transluminal coronary angioplasty) 11/09/2014  . Pacemaker: [place] DR MRI [id number] Dual chamber pacemaker 10/08/2014 10/08/2014  . Encounter for care of pacemaker 10/08/2014  . Sinus node dysfunction ([place]) 10/08/2014  . Cardiac asystole ([place]) 10/07/2014  . Cardiac syncope 10/07/2014  . Diabetes mellitus with stage 3 chronic kidney disease  ([place]) 10/07/2014  . CAD (coronary artery disease), native coronary artery 10/07/2014  . Diabetes mellitus due to underlying condition without complications ([place]) 52/06/222  . Anemia, unspecified 05/25/2014  . Syncope 04/30/2014  . Atherosclerosis of native arteries of the extremities with gangrene ([place]) 12/16/2013  . Volume overload 10/30/2013  . Acute renal failure ([place]) 10/28/2013  . PAD (peripheral artery disease) ([place]) 10/27/2013  . Leukocytosis 10/21/2013  . Anemia 10/21/2013  . Osteomyelitis of left foot ([place]) 08/19/2013  . Spinal stenosis, lumbar region, with neurogenic claudication 06/12/2012    Class: Diagnosis of  . DIABETES MELLITUS, TYPE II 03/15/2009  . ALCOHOL ABUSE 03/15/2009  . TOBACCO USE 03/15/2009  . HYPERTENSION 03/15/2009    Jamey Reas, PT, DPT 02/08/2021, 4:27 PM  [facility] [address] [place], [place], [address] Phone: [phone]   Fax:  [phone]  Name: Alexander [name] MRN: [id number] Date of Birth: 1961/01/28

## 2021-02-09 DIAGNOSIS — T8249XA Other complication of vascular dialysis catheter, initial encounter: Secondary | ICD-10-CM | POA: Diagnosis not present

## 2021-02-09 DIAGNOSIS — D631 Anemia in chronic kidney disease: Secondary | ICD-10-CM | POA: Diagnosis not present

## 2021-02-09 DIAGNOSIS — D689 Coagulation defect, unspecified: Secondary | ICD-10-CM | POA: Diagnosis not present

## 2021-02-09 DIAGNOSIS — N186 End stage renal disease: Secondary | ICD-10-CM | POA: Diagnosis not present

## 2021-02-09 DIAGNOSIS — E0821 Diabetes mellitus due to underlying condition with diabetic nephropathy: Secondary | ICD-10-CM | POA: Diagnosis not present

## 2021-02-09 DIAGNOSIS — Z992 Dependence on renal dialysis: Secondary | ICD-10-CM | POA: Diagnosis not present

## 2021-02-09 DIAGNOSIS — D509 Iron deficiency anemia, unspecified: Secondary | ICD-10-CM | POA: Diagnosis not present

## 2021-02-09 DIAGNOSIS — N2581 Secondary hyperparathyroidism of renal origin: Secondary | ICD-10-CM | POA: Diagnosis not present

## 2021-02-09 DIAGNOSIS — N179 Acute kidney failure, unspecified: Secondary | ICD-10-CM | POA: Diagnosis not present

## 2021-02-11 DIAGNOSIS — N2581 Secondary hyperparathyroidism of renal origin: Secondary | ICD-10-CM | POA: Diagnosis not present

## 2021-02-11 DIAGNOSIS — Z992 Dependence on renal dialysis: Secondary | ICD-10-CM | POA: Diagnosis not present

## 2021-02-11 DIAGNOSIS — E0821 Diabetes mellitus due to underlying condition with diabetic nephropathy: Secondary | ICD-10-CM | POA: Diagnosis not present

## 2021-02-11 DIAGNOSIS — D689 Coagulation defect, unspecified: Secondary | ICD-10-CM | POA: Diagnosis not present

## 2021-02-11 DIAGNOSIS — T8249XA Other complication of vascular dialysis catheter, initial encounter: Secondary | ICD-10-CM | POA: Diagnosis not present

## 2021-02-11 DIAGNOSIS — N186 End stage renal disease: Secondary | ICD-10-CM | POA: Diagnosis not present

## 2021-02-13 ENCOUNTER — Other Ambulatory Visit: Payer: Self-pay

## 2021-02-13 ENCOUNTER — Ambulatory Visit (INDEPENDENT_AMBULATORY_CARE_PROVIDER_SITE_OTHER): Payer: Medicare Other | Admitting: Physical Therapy

## 2021-02-13 ENCOUNTER — Encounter: Payer: Self-pay | Admitting: Physical Therapy

## 2021-02-13 DIAGNOSIS — R2681 Unsteadiness on feet: Secondary | ICD-10-CM | POA: Diagnosis not present

## 2021-02-13 DIAGNOSIS — M21372 Foot drop, left foot: Secondary | ICD-10-CM | POA: Diagnosis not present

## 2021-02-13 DIAGNOSIS — R2689 Other abnormalities of gait and mobility: Secondary | ICD-10-CM | POA: Diagnosis not present

## 2021-02-13 DIAGNOSIS — R293 Abnormal posture: Secondary | ICD-10-CM

## 2021-02-13 NOTE — Therapy (Signed)
[facility] [address] [place], [place], [address] Phone: [phone]   Fax:  [phone]  Physical Therapy Treatment  Patient Details  Name: Alexander [name] MRN: [id number] Date of Birth: 05-Nov-1961 Referring Provider (PT): Meridee Score, MD   Encounter Date: 02/13/2021   PT End of Session - 02/13/21 0852    Visit Number 12    Number of Visits 25    Date for PT Re-Evaluation 04/04/21    Authorization Type BCBS Medicare    Authorization Time Period $40 co-pay    PT Start Time [phone]    PT Stop Time 0930    PT Time Calculation (min) 44 min    Equipment Utilized During Treatment Gait belt    Activity Tolerance Patient tolerated treatment well    Behavior During Therapy [facility] for tasks assessed/performed           Past Medical History:  Diagnosis Date  . Alcohol abuse   . Anemia   . Arthritis    "patient does not think so."  . Atrial fibrillation ([place])   . Cardiac syncope 10/07/14   rapid A fib with 8 sec pauses on converison with syncope- temp pacing wire placed then PPM  . Cataract    BILATERAL  . Chronic kidney disease   . Coronary artery disease   . Diabetes mellitus    dx---been  awhile  . Encounter for care of pacemaker 10/08/2014  . History of blood transfusion   . Hyperlipemia   . Hypertension   . Osteomyelitis ([place])    right foot  . Peripheral vascular disease ([place])   . Presence of permanent cardiac pacemaker 10/08/2014  . Sinus node dysfunction ([place]) 10/08/2014    Past Surgical History:  Procedure Laterality Date  . ABDOMINAL AORTOGRAM W/LOWER EXTREMITY N/A 09/09/2017   Procedure: ABDOMINAL AORTOGRAM W/LOWER EXTREMITY;  Surgeon: Angelia Mould, MD;  Location: [place] CV [facility];  Service: Cardiovascular;  Laterality: N/A;  . ABDOMINAL AORTOGRAM W/LOWER EXTREMITY Right 11/11/2019   Procedure: ABDOMINAL AORTOGRAM W/LOWER EXTREMITY;  Surgeon: Marty Heck, MD;  Location: [place] [facility];  Service:  Cardiovascular;  Laterality: Right;  . AMPUTATION Left 08/19/2013   Procedure: AMPUTATION RAY;  Surgeon: Alta Corning, MD;  Location: [place];  Service: Orthopedics;  Laterality: Left;  ray amputation left 5th  . AMPUTATION Left 10/27/2013   Procedure: AMPUTATION DIGIT-LEFT 4TH TOE, 4th and 5th metatarsal.;  Surgeon: Angelia Mould, MD;  Location: [place];  Service: Vascular;  Laterality: Left;  . AMPUTATION Left 11/04/2013   Procedure: LEFT FOOT TRANS-METATARSAL AMPUTATION WITH WOUND CLOSURE ;  Surgeon: Alta Corning, MD;  Location: [place];  Service: Orthopedics;  Laterality: Left;  . AMPUTATION Right 09/10/2017   Procedure: RIGHT FOURTH AND FIFTH TOE AMPUTATION;  Surgeon: Angelia Mould, MD;  Location: [place];  Service: Vascular;  Laterality: Right;  . AMPUTATION Right 02/19/2020   Procedure: AMPUTATION RIGHT 3RD TOE;  Surgeon: Angelia Mould, MD;  Location: [place];  Service: Vascular;  Laterality: Right;  . AMPUTATION Right 03/11/2020   Procedure: RIGHT BELOW KNEE AMPUTATION;  Surgeon: Newt Minion, MD;  Location: [place];  Service: Orthopedics;  Laterality: Right;  . APPLICATION OF WOUND VAC Right 02/19/2020   Procedure: Application Of Wound Vac Right foot;  Surgeon: Angelia Mould, MD;  Location: [place];  Service: Vascular;  Laterality: Right;  . BIOPSY  07/20/2020   Procedure: BIOPSY;  Surgeon: Rush Landmark Telford Nab., MD;  Location:  [place] [facility];  Service: Gastroenterology;;  . CARDIAC CATHETERIZATION  10/07/2014   Procedure: LEFT HEART CATH AND CORONARY ANGIOGRAPHY;  Surgeon: Laverda Page, MD;  Location: [facility];  Service: Cardiovascular;;  . COLONOSCOPY W/ POLYPECTOMY    . COLONOSCOPY WITH PROPOFOL N/A 05/25/2020   Procedure: COLONOSCOPY WITH PROPOFOL;  Surgeon: Gatha Mayer, MD;  Location: [facility];  Service: Endoscopy;  Laterality: N/A;  . EMBOLECTOMY Right 09/12/2017   Procedure: Thrombectomy  & Redo Right Below Knee Popliteal Artery Bypass Graft  ;   Surgeon: Waynetta Sandy, MD;  Location: [place];  Service: Vascular;  Laterality: Right;  . ENTEROSCOPY N/A 07/20/2020   Procedure: ENTEROSCOPY;  Surgeon: Mansouraty, Telford Nab., MD;  Location: [place];  Service: Gastroenterology;  Laterality: N/A;  . ESOPHAGOGASTRODUODENOSCOPY (EGD) WITH PROPOFOL N/A 05/24/2020   Procedure: ESOPHAGOGASTRODUODENOSCOPY (EGD) WITH PROPOFOL;  Surgeon: Ladene Artist, MD;  Location: [facility];  Service: Endoscopy;  Laterality: N/A;  . EYE SURGERY Bilateral    cat[place]act  . FEMORAL-TIBIAL BYPASS GRAFT Left 10/27/2013   Procedure: BYPASS GRAFT LEFT FEMORAL- POSTERIOR TIBIAL ARTERY;  Surgeon: Angelia Mould, MD;  Location: [place];  Service: Vascular;  Laterality: Left;  . FEMORAL-TIBIAL BYPASS GRAFT Right 09/10/2017   Procedure: RIGHT SUPERFICIAL  FEMORAL ARTERY-BELOW KNEE POPLITEAL ARTERY BYPASS GRAFT WITH VEIN;  Surgeon: Angelia Mould, MD;  Location: [place];  Service: Vascular;  Laterality: Right;  . GIVENS CAPSULE STUDY N/A 07/20/2020   Procedure: GIVENS CAPSULE STUDY;  Surgeon: Irving Copas., MD;  Location: [place];  Service: Gastroenterology;  Laterality: N/A;  capsule placed through scope into duodenum @ 0936  . I & D EXTREMITY Left 10/27/2013   Procedure: IRRIGATION AND DEBRIDEMENT EXTREMITY- LEFT FOOT;  Surgeon: Angelia Mould, MD;  Location: [place];  Service: Vascular;  Laterality: Left;  . I & D EXTREMITY Right 01/26/2020   Procedure: IRRIGATION AND DEBRIDEMENT EXTREMITY RIGHT FOOT WOUND;  Surgeon: Angelia Mould, MD;  Location: [place];  Service: Vascular;  Laterality: Right;  . INTRAOPERATIVE ARTERIOGRAM Right 09/10/2017   Procedure: INTRA OPERATIVE ARTERIOGRAM;  Surgeon: Angelia Mould, MD;  Location: [place];  Service: Vascular;  Laterality: Right;  . IR ANGIOGRAM FOLLOW UP STUDY  09/09/2017  . IR FLUORO GUIDE CV LINE RIGHT  05/31/2020  . IR US GUIDE VASC ACCESS RIGHT  06/01/2020  . LEFT HEART CATH  AND CORONARY ANGIOGRAPHY N/A 08/19/2020   Procedure: LEFT HEART CATH AND CORONARY ANGIOGRAPHY;  Surgeon: Adrian Prows, MD;  Location: [place] CV LAB;  Service: Cardiovascular;  Laterality: N/A;  . LEFT HEART CATHETERIZATION WITH CORONARY ANGIOGRAM N/A 11/09/2014   Procedure: LEFT HEART CATHETERIZATION WITH CORONARY ANGIOGRAM;  Surgeon: Laverda Page, MD;  Location: [facility] CATH LAB;  Service: Cardiovascular;  Laterality: N/A;  . LOWER EXTREMITY ANGIOGRAM Right 11/08/2015   Procedure: Lower Extremity Angiogram;  Surgeon: Serafina Mitchell, MD;  Location: [place] CV LAB;  Service: Cardiovascular;  Laterality: Right;  . LUMBAR LAMINECTOMY  06/13/2012   Procedure: MICRODISCECTOMY LUMBAR LAMINECTOMY;  Surgeon: Jessy Oto, MD;  Location: [place];  Service: Orthopedics;  Laterality: N/A;  Central laminectomy L2-3, L3-4, L4-5  . PACEMAKER INSERTION  10/08/14   MDT Advisa MRI compatible dual chamber pacemaker implanted by Dr Caryl Comes for syncope with post-termination pauses  . PERCUTANEOUS CORONARY STENT INTERVENTION (PCI-S)  11/09/2014   des to lad & distal circumflex         Dr  [name]  . PERIPHERAL VASCULAR  BALLOON ANGIOPLASTY Right 11/11/2019   Procedure: PERIPHERAL VASCULAR BALLOON ANGIOPLASTY;  Surgeon: Marty Heck, MD;  Location: [place] [facility];  Service: Cardiovascular;  Laterality: Right;  below knee popliteal, tibioperoneal trunk, posterior tibial arteries  . PERIPHERAL VASCULAR CATHETERIZATION N/A 11/08/2015   Procedure: Abdominal Aortogram;  Surgeon: Serafina Mitchell, MD;  Location: [place] [facility];  Service: Cardiovascular;  Laterality: N/A;  . PERIPHERAL VASCULAR CATHETERIZATION Right 11/08/2015   Procedure: Peripheral Vascular Atherectomy;  Surgeon: Serafina Mitchell, MD;  Location: [place] CV [facility];  Service: Cardiovascular;  Laterality: Right;  . PERIPHERAL VASCULAR CATHETERIZATION N/A 10/10/2016   Procedure: Lower Extremity Angiography;  Surgeon: Waynetta Sandy, MD;   Location: [place] CV LAB;  Service: Cardiovascular;  Laterality: N/A;  . PERIPHERAL VASCULAR CATHETERIZATION Right 10/10/2016   Procedure: Peripheral Vascular Atherectomy;  Surgeon: Waynetta Sandy, MD;  Location: [place] [facility];  Service: Cardiovascular;  Laterality: Right;  Popliteal  . PERMANENT PACEMAKER INSERTION N/A 10/08/2014   Procedure: PERMANENT PACEMAKER INSERTION;  Surgeon: Deboraha Sprang, MD;  Location: [facility] CATH [facility];  Service: Cardiovascular;  Laterality: N/A;  . SUBMUCOSAL TATTOO INJECTION  07/20/2020   Procedure: SUBMUCOSAL TATTOO INJECTION;  Surgeon: Irving Copas., MD;  Location: [place];  Service: Gastroenterology;;  . TEMPORARY PACEMAKER INSERTION Bilateral 10/07/2014   Procedure: TEMPORARY PACEMAKER INSERTION;  Surgeon: Laverda Page, MD;  Location: [facility] CATH LAB;  Service: Cardiovascular;  Laterality: Bilateral;  . WOUND DEBRIDEMENT Right 02/19/2020   Procedure: DEBRIDEMENT WOUND RIGHT FOOT;  Surgeon: Angelia Mould, MD;  Location: [place];  Service: Vascular;  Laterality: Right;    There were no vitals filed for this visit.   Subjective Assessment - 02/13/21 0850    Subjective He is wearing prosthesis most of awake hours except dialysis. He removes prosthesis a couple of times per day to cool off & dry from sweating.    Pertinent History Rt TTA, artthritis, A-Fib, cardiac syncope, cataract, CKD, CAD, DM, ESRD w/HD since 7/21, HTN, PVD, cardiac pacemaker,    Limitations Walking;House hold activities    Patient Stated Goals to use prosthesis to walk in community, to hunt & fish, yardwork & garden    Currently in Pain? No/denies                             [facility] Adult PT Treatment/Exercise - 02/13/21 0846      Transfers   Transfers Sit to Stand;Stand to Sit    Sit to Stand 5: Supervision;With upper extremity assist;With armrests;From chair/3-in-1;Other (comment)   requires RW support for stabilization   Stand to Sit  5: Supervision;With upper extremity assist;With armrests;To chair/3-in-1;Other (comment)   requires RW for stabilization     Ambulation/Gait   Ambulation/Gait Yes    Ambulation/Gait Assistance 5: Supervision    Ambulation/Gait Assistance Details verbal & tactile cues on upright posture & wt shiftover prosthesis in stance    Ambulation Distance (Feet) 75 Feet   47' & 100' w/RW, 30' X 1 cane / HHA   Assistive device Prosthesis;Rolling walker;1 person hand held assist;Straight cane   straight cane stand alone tip   Ambulation Surface Level;Indoor    Ramp 5: Supervision   RW & TTA prosthesis   Curb 5: Supervision   RW     Exercises   Exercises Knee/Hip      Knee/Hip Exercises: Stretches   Active Hamstring Stretch Right;2 reps;20 seconds  Active Hamstring Stretch Limitations SLR with strap DF      Knee/Hip Exercises: Aerobic   Nustep seat 11 level 7 with BLEs & BUEs 8 min      Knee/Hip Exercises: Machines for Strengthening   Cybex Leg Press shuttle leg press BLEs 100# 15 reps 2 sets 1st set back 45* & 2nd set back flat      Knee/Hip Exercises: Standing   Rocker Board 1 minute   round board, ant/mid/post & right/mid/left,   Rocker Board Limitations mirror for visual upright, verbal cues on control, BUE support on //bars      Knee/Hip Exercises: Seated   Sit to Sand 1 set;10 reps;without UE support   from 24" bar stool     Prosthetics   Prosthetic Care Comments  --    Current prosthetic wear tolerance (days/week)  daily    Current prosthetic wear tolerance (#hours/day)  nondialysis days & after dialysis all awake hours except 2 hrs midday    Current prosthetic weight-bearing tolerance (hours/day)  Patient tolerated standing & gait with partial weight on prosthesis for 5 minutes with no c/o limb pain or discomfort.    Edema Pitting edema with 5-7 sec capillary refill    Residual limb condition  No open areas, normal color, tempertaure & moisture, shape cylinderical    Education  Provided Correct ply sock adjustment;Proper Donning;Proper wear schedule/adjustment;Other (comment)   see prosthetic care comments   Person(s) Educated Patient    Education Method Explanation;Verbal cues    Education Method Verbalized understanding;Verbal cues required;Needs further instruction                    PT Short Term Goals - 02/01/21 1207      PT SHORT TERM GOAL #1   Title Patient verbalizes proper weighing & prosthetic issues with dialysis.    Time 1    Period Months    Status New    Target Date 03/03/21      PT SHORT TERM GOAL #2   Title Pateint tolerates prosthesis wear >90% of awake hours / day with no skin issues.    Time 1    Period Months    Status Revised    Target Date 03/03/21      PT SHORT TERM GOAL #3   Title Pateint is able to reach 10" anteriorly & pick up item from floor with cane safely.    Time 1    Period Months    Status Revised    Target Date 03/03/21      PT SHORT TERM GOAL #4   Title Pateint ambulates 300' with RW & prosthesis modified independent.    Time 1    Period Months    Status Revised    Target Date 03/03/21      PT SHORT TERM GOAL #5   Title Patient negotiates ramps & curbs with RW & prosthesis with supervision    Time 1    Period Months    Status Revised    Target Date 03/03/21      Additional Short Term Goals   Additional Short Term Goals Yes      PT SHORT TERM GOAL #6   Title Patient ambulates 33' with cane & prosthesis with minA    Time 4    Period Weeks    Status New    Target Date 03/03/21             PT Long Term Goals - 01/18/21 3154  PT LONG TERM GOAL #1   Title Patient verbalizes & demonstrates understanding of prosthetic care to enable safe utilization of prosthesis.    Time 3    Period Months    Status On-going    Target Date 04/03/21      PT LONG TERM GOAL #2   Title Patient tolerates wear of prosthesis >90% of awake hours without skin or limb pain issues to enable function  throughout his day.    Time 3    Period Months    Status On-going    Target Date 04/03/21      PT LONG TERM GOAL #3   Title Task of [place] Balance with RW  >/= 45/56    Time 3    Period Months    Status On-going    Target Date 04/03/21      PT LONG TERM GOAL #4   Title Patient ambulates >400' with LRAD & prosthesis modified independent.    Time 3    Period Months    Status On-going    Target Date 04/03/21      PT LONG TERM GOAL #5   Title Patient negotiates ramps, curbs & stairs with LRAD & prosthesis modified independent.    Time 3    Period Months    Status On-going    Target Date 04/03/21                 Plan - 02/13/21 9629    Clinical Impression Statement Patient continues to be limited by vascular pain in LEs limiting standing & gait activities.  PT continues to progress activities as tolerated but he requires seated rest.    Personal Factors and Comorbidities Comorbidity 3+;Fitness;Time since onset of injury/illness/exacerbation;Past/Current Experience    Comorbidities Rt TTA, left Transmetatarsal Amputation, artthritis, A-Fib, cardiac syncope, cataract, CKD, CAD, DM, ESRD w/HD since 7/21, HTN, PVD, cardiac pacemaker,    Examination-Activity Limitations Locomotion Level;Squat;Stairs;Stand;Toileting;Transfers    Examination-Participation Restrictions Community Activity    Stability/Clinical Decision Making Evolving/Moderate complexity    Rehab Potential Good    PT Frequency 2x / week    PT Duration 12 weeks    PT Treatment/Interventions ADLs/Self Care Home Management;DME Instruction;Gait training;Stair training;Functional mobility training;Therapeutic activities;Therapeutic exercise;Balance training;Neuromuscular re-education;Patient/family education;Prosthetic Training;Vestibular    PT Next Visit Plan review prosthetic care, prosthetic gait household level with cane & community level with RW including ramps & curbs. standing balance & strength    Consulted and  Agree with Plan of Care Patient           Patient will benefit from skilled therapeutic intervention in order to improve the following deficits and impairments:  Abnormal gait,Cardiopulmonary status limiting activity,Decreased activity tolerance,Decreased balance,Decreased endurance,Decreased knowledge of use of DME,Decreased mobility,Decreased scar mobility,Decreased strength,Increased edema,Impaired flexibility,Postural dysfunction,Prosthetic Dependency,Obesity  Visit Diagnosis: Unsteadiness on feet  Abnormal posture  Foot drop, left  Other abnormalities of gait and mobility     Problem List Patient Active Problem List   Diagnosis Date Noted  . NSVT (nonsustained ventricular tachycardia) ([place])   . Pancytopenia ([place])   . Acute blood loss anemia 07/18/2020  . Hemodialysis patient ([place]) 06/04/2020  . Necrotizing glomerulonephritis 06/04/2020  . Heme positive stool   . Melena   . Upper GI bleeding 05/23/2020  . Acute GI bleeding 05/23/2020  . Acute hematogenous osteomyelitis of right foot ([place]) 03/11/2020  . Gangrene of right foot ([place])   . Diabetic foot ulcer associated with type 2 diabetes mellitus ([place]) 02/19/2020  . Heel ulcer (  [place]) 11/05/2019  . Atherosclerotic PVD with ulceration ([place]) 09/09/2017  . Essential hypertension, benign 02/07/2015  . Renal insufficiency 02/07/2015  . Post PTCA 11/09/2014  . S/P PTCA (percutaneous transluminal coronary angioplasty) 11/09/2014  . Pacemaker: [place] DR MRI [id number] Dual chamber pacemaker 10/08/2014 10/08/2014  . Encounter for care of pacemaker 10/08/2014  . Sinus node dysfunction ([place]) 10/08/2014  . Cardiac asystole ([place]) 10/07/2014  . Cardiac syncope 10/07/2014  . Diabetes mellitus with stage 3 chronic kidney disease ([place]) 10/07/2014  . CAD (coronary artery disease), native coronary artery 10/07/2014  . Diabetes mellitus due to underlying condition without complications ([place]) 47/15/9539  . Anemia, unspecified  05/25/2014  . Syncope 04/30/2014  . Atherosclerosis of native arteries of the extremities with gangrene ([place]) 12/16/2013  . Volume overload 10/30/2013  . Acute renal failure ([place]) 10/28/2013  . PAD (peripheral artery disease) ([place]) 10/27/2013  . Leukocytosis 10/21/2013  . Anemia 10/21/2013  . Osteomyelitis of left foot ([place]) 08/19/2013  . Spinal stenosis, lumbar region, with neurogenic claudication 06/12/2012    Class: Diagnosis of  . DIABETES MELLITUS, TYPE II 03/15/2009  . ALCOHOL ABUSE 03/15/2009  . TOBACCO USE 03/15/2009  . HYPERTENSION 03/15/2009    Jamey Reas, PT, DPT 02/13/2021, 12:06 PM  [facility] [address] [place], [place], [address] Phone: [phone]   Fax:  [phone]  Name: ROSHAWN AYALA MRN: [id number] Date of Birth: [birth year]

## 2021-02-14 DIAGNOSIS — E0821 Diabetes mellitus due to underlying condition with diabetic nephropathy: Secondary | ICD-10-CM | POA: Diagnosis not present

## 2021-02-14 DIAGNOSIS — T8249XA Other complication of vascular dialysis catheter, initial encounter: Secondary | ICD-10-CM | POA: Diagnosis not present

## 2021-02-14 DIAGNOSIS — Z992 Dependence on renal dialysis: Secondary | ICD-10-CM | POA: Diagnosis not present

## 2021-02-14 DIAGNOSIS — D689 Coagulation defect, unspecified: Secondary | ICD-10-CM | POA: Diagnosis not present

## 2021-02-14 DIAGNOSIS — N186 End stage renal disease: Secondary | ICD-10-CM | POA: Diagnosis not present

## 2021-02-14 DIAGNOSIS — D509 Iron deficiency anemia, unspecified: Secondary | ICD-10-CM | POA: Diagnosis not present

## 2021-02-14 DIAGNOSIS — D631 Anemia in chronic kidney disease: Secondary | ICD-10-CM | POA: Diagnosis not present

## 2021-02-14 DIAGNOSIS — N2581 Secondary hyperparathyroidism of renal origin: Secondary | ICD-10-CM | POA: Diagnosis not present

## 2021-02-15 ENCOUNTER — Other Ambulatory Visit: Payer: Self-pay

## 2021-02-15 ENCOUNTER — Ambulatory Visit (INDEPENDENT_AMBULATORY_CARE_PROVIDER_SITE_OTHER): Payer: Medicare Other | Admitting: Physical Therapy

## 2021-02-15 ENCOUNTER — Encounter: Payer: Self-pay | Admitting: Physical Therapy

## 2021-02-15 DIAGNOSIS — M21372 Foot drop, left foot: Secondary | ICD-10-CM | POA: Diagnosis not present

## 2021-02-15 DIAGNOSIS — R2689 Other abnormalities of gait and mobility: Secondary | ICD-10-CM

## 2021-02-15 DIAGNOSIS — R293 Abnormal posture: Secondary | ICD-10-CM | POA: Diagnosis not present

## 2021-02-15 DIAGNOSIS — R2681 Unsteadiness on feet: Secondary | ICD-10-CM

## 2021-02-15 NOTE — Therapy (Signed)
[facility] [address] [place], [place], [address] Phone: [phone]   Fax:  [phone]  Physical Therapy Treatment  Patient Details  Name: MALEKAI MARKWOOD MRN: [id number] Date of Birth: 1961/10/01 Referring Provider (PT): Meridee Score, MD   Encounter Date: 02/15/2021   PT End of Session - 02/15/21 0854    Visit Number 13    Number of Visits 25    Date for PT Re-Evaluation 04/04/21    Authorization Type BCBS Medicare    Authorization Time Period $40 co-pay    PT Start Time [phone]    PT Stop Time 0930    PT Time Calculation (min) 43 min    Equipment Utilized During Treatment Gait belt    Activity Tolerance Patient tolerated treatment well    Behavior During Therapy [facility] for tasks assessed/performed           Past Medical History:  Diagnosis Date  . Alcohol abuse   . Anemia   . Arthritis    "patient does not think so."  . Atrial fibrillation ([place])   . Cardiac syncope 10/07/14   rapid A fib with 8 sec pauses on converison with syncope- temp pacing wire placed then PPM  . Cataract    BILATERAL  . Chronic kidney disease   . Coronary artery disease   . Diabetes mellitus    dx---been  awhile  . Encounter for care of pacemaker 10/08/2014  . History of blood transfusion   . Hyperlipemia   . Hypertension   . Osteomyelitis ([place])    right foot  . Peripheral vascular disease ([place])   . Presence of permanent cardiac pacemaker 10/08/2014  . Sinus node dysfunction ([place]) 10/08/2014    Past Surgical History:  Procedure Laterality Date  . ABDOMINAL AORTOGRAM W/LOWER EXTREMITY N/A 09/09/2017   Procedure: ABDOMINAL AORTOGRAM W/LOWER EXTREMITY;  Surgeon: Angelia Mould, MD;  Location: [place] [facility];  Service: Cardiovascular;  Laterality: N/A;  . ABDOMINAL AORTOGRAM W/LOWER EXTREMITY Right 11/11/2019   Procedure: ABDOMINAL AORTOGRAM W/LOWER EXTREMITY;  Surgeon: Marty Heck, MD;  Location: [place] [facility];  Service:  Cardiovascular;  Laterality: Right;  . AMPUTATION Left 08/19/2013   Procedure: AMPUTATION RAY;  Surgeon: Alta Corning, MD;  Location: [place];  Service: Orthopedics;  Laterality: Left;  ray amputation left 5th  . AMPUTATION Left 10/27/2013   Procedure: AMPUTATION DIGIT-LEFT 4TH TOE, 4th and 5th metatarsal.;  Surgeon: Angelia Mould, MD;  Location: [place];  Service: Vascular;  Laterality: Left;  . AMPUTATION Left 11/04/2013   Procedure: LEFT FOOT TRANS-METATARSAL AMPUTATION WITH WOUND CLOSURE ;  Surgeon: Alta Corning, MD;  Location: [place];  Service: Orthopedics;  Laterality: Left;  . AMPUTATION Right 09/10/2017   Procedure: RIGHT FOURTH AND FIFTH TOE AMPUTATION;  Surgeon: Angelia Mould, MD;  Location: [place];  Service: Vascular;  Laterality: Right;  . AMPUTATION Right 02/19/2020   Procedure: AMPUTATION RIGHT 3RD TOE;  Surgeon: Angelia Mould, MD;  Location: [place];  Service: Vascular;  Laterality: Right;  . AMPUTATION Right 03/11/2020   Procedure: RIGHT BELOW KNEE AMPUTATION;  Surgeon: Newt Minion, MD;  Location: [place];  Service: Orthopedics;  Laterality: Right;  . APPLICATION OF WOUND VAC Right 02/19/2020   Procedure: Application Of Wound Vac Right foot;  Surgeon: Angelia Mould, MD;  Location: [place];  Service: Vascular;  Laterality: Right;  . BIOPSY  07/20/2020   Procedure: BIOPSY;  Surgeon: Rush Landmark Telford Nab., MD;  Location:  [place] [facility];  Service: Gastroenterology;;  . CARDIAC CATHETERIZATION  10/07/2014   Procedure: LEFT HEART CATH AND CORONARY ANGIOGRAPHY;  Surgeon: Laverda Page, MD;  Location: [facility];  Service: Cardiovascular;;  . COLONOSCOPY W/ POLYPECTOMY    . COLONOSCOPY WITH PROPOFOL N/A 05/25/2020   Procedure: COLONOSCOPY WITH PROPOFOL;  Surgeon: Gatha Mayer, MD;  Location: [facility];  Service: Endoscopy;  Laterality: N/A;  . EMBOLECTOMY Right 09/12/2017   Procedure: Thrombectomy  & Redo Right Below Knee Popliteal Artery Bypass Graft  ;   Surgeon: Waynetta Sandy, MD;  Location: [place];  Service: Vascular;  Laterality: Right;  . ENTEROSCOPY N/A 07/20/2020   Procedure: ENTEROSCOPY;  Surgeon: Mansouraty, Telford Nab., MD;  Location: [place];  Service: Gastroenterology;  Laterality: N/A;  . ESOPHAGOGASTRODUODENOSCOPY (EGD) WITH PROPOFOL N/A 05/24/2020   Procedure: ESOPHAGOGASTRODUODENOSCOPY (EGD) WITH PROPOFOL;  Surgeon: Ladene Artist, MD;  Location: [facility];  Service: Endoscopy;  Laterality: N/A;  . EYE SURGERY Bilateral    cataract  . FEMORAL-TIBIAL BYPASS GRAFT Left 10/27/2013   Procedure: BYPASS GRAFT LEFT FEMORAL- POSTERIOR TIBIAL ARTERY;  Surgeon: Angelia Mould, MD;  Location: [place];  Service: Vascular;  Laterality: Left;  . FEMORAL-TIBIAL BYPASS GRAFT Right 09/10/2017   Procedure: RIGHT SUPERFICIAL  FEMORAL ARTERY-BELOW KNEE POPLITEAL ARTERY BYPASS GRAFT WITH VEIN;  Surgeon: Angelia Mould, MD;  Location: [place];  Service: Vascular;  Laterality: Right;  . GIVENS CAPSULE STUDY N/A 07/20/2020   Procedure: GIVENS CAPSULE STUDY;  Surgeon: Irving Copas., MD;  Location: [place];  Service: Gastroenterology;  Laterality: N/A;  capsule placed through scope into duodenum @ 0936  . I & D EXTREMITY Left 10/27/2013   Procedure: IRRIGATION AND DEBRIDEMENT EXTREMITY- LEFT FOOT;  Surgeon: Angelia Mould, MD;  Location: [place];  Service: Vascular;  Laterality: Left;  . I & D EXTREMITY Right 01/26/2020   Procedure: IRRIGATION AND DEBRIDEMENT EXTREMITY RIGHT FOOT WOUND;  Surgeon: Angelia Mould, MD;  Location: [place];  Service: Vascular;  Laterality: Right;  . INTRAOPERATIVE ARTERIOGRAM Right 09/10/2017   Procedure: INTRA OPERATIVE ARTERIOGRAM;  Surgeon: Angelia Mould, MD;  Location: [place];  Service: Vascular;  Laterality: Right;  . IR ANGIOGRAM FOLLOW UP STUDY  09/09/2017  . IR FLUORO GUIDE CV LINE RIGHT  05/31/2020  . IR US GUIDE VASC ACCESS RIGHT  06/01/2020  . LEFT HEART CATH  AND CORONARY ANGIOGRAPHY N/A 08/19/2020   Procedure: LEFT HEART CATH AND CORONARY ANGIOGRAPHY;  Surgeon: Adrian Prows, MD;  Location: [place] [facility];  Service: Cardiovascular;  Laterality: N/A;  . LEFT HEART CATHETERIZATION WITH CORONARY ANGIOGRAM N/A 11/09/2014   Procedure: LEFT HEART CATHETERIZATION WITH CORONARY ANGIOGRAM;  Surgeon: Laverda Page, MD;  Location: [facility] CATH LAB;  Service: Cardiovascular;  Laterality: N/A;  . LOWER EXTREMITY ANGIOGRAM Right 11/08/2015   Procedure: Lower Extremity Angiogram;  Surgeon: Serafina Mitchell, MD;  Location: [place] [facility];  Service: Cardiovascular;  Laterality: Right;  . LUMBAR LAMINECTOMY  06/13/2012   Procedure: MICRODISCECTOMY LUMBAR LAMINECTOMY;  Surgeon: Jessy Oto, MD;  Location: [place];  Service: Orthopedics;  Laterality: N/A;  Central laminectomy L2-3, L3-4, L4-5  . PACEMAKER INSERTION  10/08/14   MDT Advisa MRI compatible dual chamber pacemaker implanted by Dr Caryl Comes for syncope with post-termination pauses  . PERCUTANEOUS CORONARY STENT INTERVENTION (PCI-S)  11/09/2014   des to lad & distal circumflex         Dr  [name]  . PERIPHERAL VASCULAR  BALLOON ANGIOPLASTY Right 11/11/2019   Procedure: PERIPHERAL VASCULAR BALLOON ANGIOPLASTY;  Surgeon: Marty Heck, MD;  Location: [place] [facility];  Service: Cardiovascular;  Laterality: Right;  below knee popliteal, tibioperoneal trunk, posterior tibial arteries  . PERIPHERAL VASCULAR CATHETERIZATION N/A 11/08/2015   Procedure: Abdominal Aortogram;  Surgeon: Serafina Mitchell, MD;  Location: [place] [facility];  Service: Cardiovascular;  Laterality: N/A;  . PERIPHERAL VASCULAR CATHETERIZATION Right 11/08/2015   Procedure: Peripheral Vascular Atherectomy;  Surgeon: Serafina Mitchell, MD;  Location: [place] [facility];  Service: Cardiovascular;  Laterality: Right;  . PERIPHERAL VASCULAR CATHETERIZATION N/A 10/10/2016   Procedure: Lower Extremity Angiography;  Surgeon: Waynetta Sandy, MD;   Location: [place] [facility];  Service: Cardiovascular;  Laterality: N/A;  . PERIPHERAL VASCULAR CATHETERIZATION Right 10/10/2016   Procedure: Peripheral Vascular Atherectomy;  Surgeon: Waynetta Sandy, MD;  Location: [place] [facility];  Service: Cardiovascular;  Laterality: Right;  Popliteal  . PERMANENT PACEMAKER INSERTION N/A 10/08/2014   Procedure: PERMANENT PACEMAKER INSERTION;  Surgeon: Deboraha Sprang, MD;  Location: [facility] C[facility] LAB;  Service: Cardiovascular;  Laterality: N/A;  . SUBMUCOSAL TATTOO INJECTION  07/20/2020   Procedure: SUBMUCOSAL TATTOO INJECTION;  Surgeon: Irving Copas., MD;  Location: [place];  Service: Gastroenterology;;  . TEMPORARY PACEMAKER INSERTION Bilateral 10/07/2014   Procedure: TEMPORARY PACEMAKER INSERTION;  Surgeon: Laverda Page, MD;  Location: [facility] CATH LAB;  Service: Cardiovascular;  Laterality: Bilateral;  . WOUND DEBRIDEMENT Right 02/19/2020   Procedure: DEBRIDEMENT WOUND RIGHT FOOT;  Surgeon: Angelia Mould, MD;  Location: [place];  Service: Vascular;  Laterality: Right;    There were no vitals filed for this visit.   Subjective Assessment - 02/15/21 0847    Subjective The time that he wore prosthesis he felt it helped. But he has only worn it once and not again for no particular reason.    Pertinent History Rt TTA, artthritis, A-Fib, cardiac syncope, cataract, CKD, CAD, DM, ESRD w/HD since 7/21, HTN, PVD, cardiac pacemaker,    Limitations Walking;House hold activities    Patient Stated Goals to use prosthesis to walk in community, to hunt & fish, yardwork & garden    Currently in Pain? No/denies                             [facility] Adult PT Treatment/Exercise - 02/15/21 0845      Transfers   Transfers Sit to Stand;Stand to Sit    Sit to Stand 5: Supervision;With upper extremity assist;With armrests;From chair/3-in-1;Other (comment)   requires RW support for stabilization   Stand to Sit 5: Supervision;With  upper extremity assist;With armrests;To chair/3-in-1;Other (comment)   requires RW for stabilization     Ambulation/Gait   Ambulation/Gait Yes    Ambulation/Gait Assistance 5: Supervision    Ambulation/Gait Assistance Details partial weight on prosthesis, cues on wt shift over prosthesis in stance.    Ambulation Distance (Feet) 100 Feet    Assistive device Rolling walker;Prosthesis    Ambulation Surface Level;Indoor    Ramp --    Curb --    Pre-Gait Activities in //bars with prosthesis on incine uphill & downhill stepping LLE with step through pattern      High Level Balance   High Level Balance Activities Side stepping;Backward walking   //bars   High Level Balance Comments verbal cues on technique with TTA prosthesis      Exercises   Exercises Knee/Hip  Knee/Hip Exercises: Stretches   Active Hamstring Stretch Right;2 reps;20 seconds    Active Hamstring Stretch Limitations SLR with strap DF      Knee/Hip Exercises: Aerobic   Nustep --      Knee/Hip Exercises: Machines for Strengthening   Cybex Leg Press shuttle leg press BLEs 100# 15 reps 2 sets 1st set back 45* & 2nd set back flat      Knee/Hip Exercises: Standing   Forward Step Up Right;Left;1 set;Hand Hold: 2;5 reps;Step Height: 8"   BOSU round side up   Forward Step Up Limitations demo & verbal cues on technique    Step Down Right;Left;1 set;5 reps;Hand Hold: 2;Step Height: 8"   BOSU round side up   Step Down Limitations demo & verbal cues on technique    Rocker Board 1 minute   round board, ant/mid/post & right/mid/left,   Rocker Board Limitations mirror for visual upright, verbal cues on control, BUE support on //bars      Knee/Hip Exercises: Seated   Sit to Sand 1 set;10 reps;without UE support   from 24" bar stool     Prosthetics   Prosthetic Care Comments  Fall risk when prosthesis is off. PT  recommending wear all awake hours. Removing prosthesis only leaving liner & socks on while dialysizing for comfort.     Current prosthetic wear tolerance (days/week)  daily    Current prosthetic wear tolerance (#hours/day)  nondialysis days & after dialysis all awake hours except 2 hrs midday    Current prosthetic weight-bearing tolerance (hours/day)  Patient tolerated standing & gait with partial weight on prosthesis for 5 minutes with no c/o limb pain or discomfort.    Edema Pitting edema with 5-7 sec capillary refill    Residual limb condition  No open areas, normal color, tempertaure & moisture, shape cylinderical    Education Provided Correct ply sock adjustment;Proper Donning;Proper wear schedule/adjustment;Other (comment)   see prosthetic care comments   Person(s) Educated Patient    Education Method Explanation;Verbal cues    Education Method Verbalized understanding;Verbal cues required;Needs further instruction                    PT Short Term Goals - 02/01/21 1207      PT SHORT TERM GOAL #1   Title Patient verbalizes proper weighing & prosthetic issues with dialysis.    Time 1    Period Months    Status New    Target Date 03/03/21      PT SHORT TERM GOAL #2   Title Pateint tolerates prosthesis wear >90% of awake hours / day with no skin issues.    Time 1    Period Months    Status Revised    Target Date 03/03/21      PT SHORT TERM GOAL #3   Title Pateint is able to reach 10" anteriorly & pick up item from floor with cane safely.    Time 1    Period Months    Status Revised    Target Date 03/03/21      PT SHORT TERM GOAL #4   Title Pateint ambulates 300' with RW & prosthesis modified independent.    Time 1    Period Months    Status Revised    Target Date 03/03/21      PT SHORT TERM GOAL #5   Title Patient negotiates ramps & curbs with RW & prosthesis with supervision    Time 1    Period Months  Status Revised    Target Date 03/03/21      Additional Short Term Goals   Additional Short Term Goals Yes      PT SHORT TERM GOAL #6   Title Patient ambulates 29' with  cane & prosthesis with minA    Time 4    Period Weeks    Status New    Target Date 03/03/21             PT Long Term Goals - 01/18/21 0941      PT LONG TERM GOAL #1   Title Patient verbalizes & demonstrates understanding of prosthetic care to enable safe utilization of prosthesis.    Time 3    Period Months    Status On-going    Target Date 04/03/21      PT LONG TERM GOAL #2   Title Patient tolerates wear of prosthesis >90% of awake hours without skin or limb pain issues to enable function throughout his day.    Time 3    Period Months    Status On-going    Target Date 04/03/21      PT LONG TERM GOAL #3   Title Task of [place] Balance with RW  >/= 45/56    Time 3    Period Months    Status On-going    Target Date 04/03/21      PT LONG TERM GOAL #4   Title Patient ambulates >400' with LRAD & prosthesis modified independent.    Time 3    Period Months    Status On-going    Target Date 04/03/21      PT LONG TERM GOAL #5   Title Patient negotiates ramps, curbs & stairs with LRAD & prosthesis modified independent.    Time 3    Period Months    Status On-going    Target Date 04/03/21                 Plan - 02/15/21 0854    Clinical Impression Statement PT worked on functional strength and balance with skilled activities.  He continues to fatigue with vascular pain in LEs limiting him. PT instructed in prosthetic wear & increasing function.    Personal Factors and Comorbidities Comorbidity 3+;Fitness;Time since onset of injury/illness/exacerbation;Past/Current Experience    Comorbidities Rt TTA, left Transmetatarsal Amputation, artthritis, A-Fib, cardiac syncope, cataract, CKD, CAD, DM, ESRD w/HD since 7/21, HTN, PVD, cardiac pacemaker,    Examination-Activity Limitations Locomotion Level;Squat;Stairs;Stand;Toileting;Transfers    Examination-Participation Restrictions Community Activity    Stability/Clinical Decision Making Evolving/Moderate complexity    Rehab  Potential Good    PT Frequency 2x / week    PT Duration 12 weeks    PT Treatment/Interventions ADLs/Self Care Home Management;DME Instruction;Gait training;Stair training;Functional mobility training;Therapeutic activities;Therapeutic exercise;Balance training;Neuromuscular re-education;Patient/family education;Prosthetic Training;Vestibular    PT Next Visit Plan review prosthetic care, prosthetic gait household level with cane & community level with RW including ramps & curbs. standing balance & strength    Consulted and Agree with Plan of Care Patient           Patient will benefit from skilled therapeutic intervention in order to improve the following deficits and impairments:  Abnormal gait,Cardiopulmonary status limiting activity,Decreased activity tolerance,Decreased balance,Decreased endurance,Decreased knowledge of use of DME,Decreased mobility,Decreased scar mobility,Decreased strength,Increased edema,Impaired flexibility,Postural dysfunction,Prosthetic Dependency,Obesity  Visit Diagnosis: Unsteadiness on feet  Abnormal posture  Foot drop, left  Other abnormalities of gait and mobility     Problem List Patient Active Problem List  Diagnosis Date Noted  . NSVT (nonsustained ventricular tachycardia) ([place])   . Pancytopenia ([place])   . Acute blood loss anemia 07/18/2020  . Hemodialysis patient ([place]) 06/04/2020  . Necrotizing glomerulonephritis 06/04/2020  . Heme positive stool   . Melena   . Upper GI bleeding 05/23/2020  . Acute GI bleeding 05/23/2020  . Acute hematogenous osteomyelitis of right foot ([place]) 03/11/2020  . Gangrene of right foot ([place])   . Diabetic foot ulcer associated with type 2 diabetes mellitus ([place]) 02/19/2020  . Heel ulcer ([place]) 11/05/2019  . Atherosclerotic PVD with ulceration ([place]) 09/09/2017  . Essential hypertension, benign 02/07/2015  . Renal insufficiency 02/07/2015  . Post PTCA 11/09/2014  . S/P PTCA (percutaneous transluminal coronary  angioplasty) 11/09/2014  . Pacemaker: [place] DR MRI [id number] Dual chamber pacemaker 10/08/2014 10/08/2014  . Encounter for care of pacemaker 10/08/2014  . Sinus node dysfunction ([place]) 10/08/2014  . Cardiac asystole ([place]) 10/07/2014  . Cardiac syncope 10/07/2014  . Diabetes mellitus with stage 3 chronic kidney disease ([place]) 10/07/2014  . CAD (coronary artery disease), native coronary artery 10/07/2014  . Diabetes mellitus due to underlying condition without complications ([place]) 02/54/2706  . Anemia, unspecified 05/25/2014  . Syncope 04/30/2014  . Atherosclerosis of native arteries of the extremities with gangrene ([place]) 12/16/2013  . Volume overload 10/30/2013  . Acute renal failure ([place]) 10/28/2013  . PAD (peripheral artery disease) ([place]) 10/27/2013  . Leukocytosis 10/21/2013  . Anemia 10/21/2013  . Osteomyelitis of left foot ([place]) 08/19/2013  . Spinal stenosis, lumbar region, with neurogenic claudication 06/12/2012    Class: Diagnosis of  . DIABETES MELLITUS, TYPE II 03/15/2009  . ALCOHOL ABUSE 03/15/2009  . TOBACCO USE 03/15/2009  . HYPERTENSION 03/15/2009    Jamey Reas, PT, DPT 02/15/2021, 2:12 PM  [facility] [address] [place], [place], [address] Phone: [phone]   Fax:  [phone]  Name: KENSON GROH MRN: [id number] Date of Birth: May 19, 1961

## 2021-02-16 DIAGNOSIS — Z992 Dependence on renal dialysis: Secondary | ICD-10-CM | POA: Diagnosis not present

## 2021-02-16 DIAGNOSIS — N2581 Secondary hyperparathyroidism of renal origin: Secondary | ICD-10-CM | POA: Diagnosis not present

## 2021-02-16 DIAGNOSIS — D689 Coagulation defect, unspecified: Secondary | ICD-10-CM | POA: Diagnosis not present

## 2021-02-16 DIAGNOSIS — T8249XA Other complication of vascular dialysis catheter, initial encounter: Secondary | ICD-10-CM | POA: Diagnosis not present

## 2021-02-16 DIAGNOSIS — D631 Anemia in chronic kidney disease: Secondary | ICD-10-CM | POA: Diagnosis not present

## 2021-02-16 DIAGNOSIS — D509 Iron deficiency anemia, unspecified: Secondary | ICD-10-CM | POA: Diagnosis not present

## 2021-02-16 DIAGNOSIS — E0821 Diabetes mellitus due to underlying condition with diabetic nephropathy: Secondary | ICD-10-CM | POA: Diagnosis not present

## 2021-02-16 DIAGNOSIS — N186 End stage renal disease: Secondary | ICD-10-CM | POA: Diagnosis not present

## 2021-02-18 DIAGNOSIS — N2581 Secondary hyperparathyroidism of renal origin: Secondary | ICD-10-CM | POA: Diagnosis not present

## 2021-02-18 DIAGNOSIS — D631 Anemia in chronic kidney disease: Secondary | ICD-10-CM | POA: Diagnosis not present

## 2021-02-18 DIAGNOSIS — Z992 Dependence on renal dialysis: Secondary | ICD-10-CM | POA: Diagnosis not present

## 2021-02-18 DIAGNOSIS — D509 Iron deficiency anemia, unspecified: Secondary | ICD-10-CM | POA: Diagnosis not present

## 2021-02-18 DIAGNOSIS — T8249XA Other complication of vascular dialysis catheter, initial encounter: Secondary | ICD-10-CM | POA: Diagnosis not present

## 2021-02-18 DIAGNOSIS — D689 Coagulation defect, unspecified: Secondary | ICD-10-CM | POA: Diagnosis not present

## 2021-02-18 DIAGNOSIS — E0821 Diabetes mellitus due to underlying condition with diabetic nephropathy: Secondary | ICD-10-CM | POA: Diagnosis not present

## 2021-02-18 DIAGNOSIS — N186 End stage renal disease: Secondary | ICD-10-CM | POA: Diagnosis not present

## 2021-02-20 ENCOUNTER — Encounter: Payer: Medicare Other | Admitting: Physical Therapy

## 2021-02-21 DIAGNOSIS — E0821 Diabetes mellitus due to underlying condition with diabetic nephropathy: Secondary | ICD-10-CM | POA: Diagnosis not present

## 2021-02-21 DIAGNOSIS — D689 Coagulation defect, unspecified: Secondary | ICD-10-CM | POA: Diagnosis not present

## 2021-02-21 DIAGNOSIS — Z992 Dependence on renal dialysis: Secondary | ICD-10-CM | POA: Diagnosis not present

## 2021-02-21 DIAGNOSIS — T8249XA Other complication of vascular dialysis catheter, initial encounter: Secondary | ICD-10-CM | POA: Diagnosis not present

## 2021-02-21 DIAGNOSIS — D631 Anemia in chronic kidney disease: Secondary | ICD-10-CM | POA: Diagnosis not present

## 2021-02-21 DIAGNOSIS — N2581 Secondary hyperparathyroidism of renal origin: Secondary | ICD-10-CM | POA: Diagnosis not present

## 2021-02-21 DIAGNOSIS — D509 Iron deficiency anemia, unspecified: Secondary | ICD-10-CM | POA: Diagnosis not present

## 2021-02-21 DIAGNOSIS — N186 End stage renal disease: Secondary | ICD-10-CM | POA: Diagnosis not present

## 2021-02-22 ENCOUNTER — Encounter: Payer: Self-pay | Admitting: Physical Therapy

## 2021-02-22 ENCOUNTER — Other Ambulatory Visit: Payer: Self-pay

## 2021-02-22 ENCOUNTER — Ambulatory Visit (INDEPENDENT_AMBULATORY_CARE_PROVIDER_SITE_OTHER): Payer: Medicare Other | Admitting: Physical Therapy

## 2021-02-22 DIAGNOSIS — M21372 Foot drop, left foot: Secondary | ICD-10-CM

## 2021-02-22 DIAGNOSIS — R2681 Unsteadiness on feet: Secondary | ICD-10-CM | POA: Diagnosis not present

## 2021-02-22 DIAGNOSIS — R2689 Other abnormalities of gait and mobility: Secondary | ICD-10-CM

## 2021-02-22 DIAGNOSIS — R293 Abnormal posture: Secondary | ICD-10-CM

## 2021-02-22 NOTE — Therapy (Signed)
[facility] Physical Therapy [address] [place], [place], [address] Phone: [phone]   Fax:  [phone]  Physical Therapy Treatment  Patient Details  Name: Alexander [name] MRN: [id number] Date of Birth: [birth year] Referring Provider (PT): Meridee Score, MD   Encounter Date: 02/22/2021   PT End of Session - 02/22/21 0852    Visit Number 14    Number of Visits 25    Date for PT Re-Evaluation 04/04/21    Authorization Type BCBS Medicare    Authorization Time Period $40 co-pay    PT Start Time [phone]    PT Stop Time 0930    PT Time Calculation (min) 43 min    Equipment Utilized During Treatment Gait belt    Activity Tolerance Patient tolerated treatment well    Behavior During Therapy [facility] for tasks assessed/performed           Past Medical History:  Diagnosis Date  . Alcohol abuse   . Anemia   . Arthritis    "patient does not think so."  . Atrial fibrillation ([place])   . Cardiac syncope 10/07/14   rapid A fib with 8 sec pauses on converison with syncope- temp pacing wire placed then PPM  . Cataract    BILATERAL  . Chronic kidney disease   . Coronary artery disease   . Diabetes mellitus    dx---been  awhile  . Encounter for care of pacemaker 10/08/2014  . History of blood transfusion   . Hyperlipemia   . Hypertension   . Osteomyelitis ([place])    right foot  . Peripheral vascular disease ([place])   . Presence of permanent cardiac pacemaker 10/08/2014  . Sinus node dysfunction ([place]) 10/08/2014    Past Surgical History:  Procedure Laterality Date  . ABDOMINAL AORTOGRAM W/LOWER EXTREMITY N/A 09/09/2017   Procedure: ABDOMINAL AORTOGRAM W/LOWER EXTREMITY;  Surgeon: Angelia Mould, MD;  Location: [place] [facility];  Service: Cardiovascular;  Laterality: N/A;  . ABDOMINAL AORTOGRAM W/LOWER EXTREMITY Right 11/11/2019   Procedure: ABDOMINAL AORTOGRAM W/LOWER EXTREMITY;  Surgeon: Marty Heck, MD;  Location: [place] [facility];  Service:  Cardiovascular;  Laterality: Right;  . AMPUTATION Left 08/19/2013   Procedure: AMPUTATION RAY;  Surgeon: Alta Corning, MD;  Location: [place];  Service: Orthopedics;  Laterality: Left;  ray amputation left 5th  . AMPUTATION Left 10/27/2013   Procedure: AMPUTATION DIGIT-LEFT 4TH TOE, 4th and 5th metatarsal.;  Surgeon: Angelia Mould, MD;  Location: [place];  Service: Vascular;  Laterality: Left;  . AMPUTATION Left 11/04/2013   Procedure: LEFT FOOT TRANS-METATARSAL AMPUTATION WITH WOUND CLOSURE ;  Surgeon: Alta Corning, MD;  Location: [place];  Service: Orthopedics;  Laterality: Left;  . AMPUTATION Right 09/10/2017   Procedure: RIGHT FOURTH AND FIFTH TOE AMPUTATION;  Surgeon: Angelia Mould, MD;  Location: [place];  Service: Vascular;  Laterality: Right;  . AMPUTATION Right 02/19/2020   Procedure: AMPUTATION RIGHT 3RD TOE;  Surgeon: Angelia Mould, MD;  Location: [place];  Service: Vascular;  Laterality: Right;  . AMPUTATION Right 03/11/2020   Procedure: RIGHT BELOW KNEE AMPUTATION;  Surgeon: Newt Minion, MD;  Location: [place];  Service: Orthopedics;  Laterality: Right;  . APPLICATION OF WOUND VAC Right 02/19/2020   Procedure: Application Of Wound Vac Right foot;  Surgeon: Angelia Mould, MD;  Location: [place];  Service: Vascular;  Laterality: Right;  . BIOPSY  07/20/2020   Procedure: BIOPSY;  Surgeon: Rush Landmark Telford Nab., MD;  Location:  [place] [facility];  Service: Gastroenterology;;  . CARDIAC CATHETERIZATION  10/07/2014   Procedure: LEFT HEART CATH AND CORONARY ANGIOGRAPHY;  Surgeon: Laverda Page, MD;  Location: [facility];  Service: Cardiovascular;;  . COLONOSCOPY W/ POLYPECTOMY    . COLONOSCOPY WITH PROPOFOL N/A 05/25/2020   Procedure: COLONOSCOPY WITH PROPOFOL;  Surgeon: Gatha Mayer, MD;  Location: [facility];  Service: Endoscopy;  Laterality: N/A;  . EMBOLECTOMY Right 09/12/2017   Procedure: Thrombectomy  & Redo Right Below Knee Popliteal Artery Bypass Graft  ;   Surgeon: Waynetta Sandy, MD;  Location: [place];  Service: Vascular;  Laterality: Right;  . ENTEROSCOPY N/A 07/20/2020   Procedure: ENTEROSCOPY;  Surgeon: Mansouraty, Telford Nab., MD;  Location: [place];  Service: Gastroenterology;  Laterality: N/A;  . ESOPHAGOGASTRODUODENOSCOPY (EGD) WITH PROPOFOL N/A 05/24/2020   Procedure: ESOPHAGOGASTRODUODENOSCOPY (EGD) WITH PROPOFOL;  Surgeon: Ladene Artist, MD;  Location: [facility];  Service: Endoscopy;  Laterality: N/A;  . EYE SURGERY Bilateral    cataract  . FEMORAL-TIBIAL BYPASS GRAFT Left 10/27/2013   Procedure: BYPASS GRAFT LEFT FEMORAL- POSTERIOR TIBIAL ARTERY;  Surgeon: Angelia Mould, MD;  Location: [place];  Service: Vascular;  Laterality: Left;  . FEMORAL-TIBIAL BYPASS GRAFT Right 09/10/2017   Procedure: RIGHT SUPERFICIAL  FEMORAL ARTERY-BELOW KNEE POPLITEAL ARTERY BYPASS GRAFT WITH VEIN;  Surgeon: Angelia Mould, MD;  Location: [place];  Service: Vascular;  Laterality: Right;  . GIVENS CAPSULE STUDY N/A 07/20/2020   Procedure: GIVENS CAPSULE STUDY;  Surgeon: Irving Copas., MD;  Location: [place];  Service: Gastroenterology;  Laterality: N/A;  capsule placed through scope into duodenum @ 0936  . I & D EXTREMITY Left 10/27/2013   Procedure: IRRIGATION AND DEBRIDEMENT EXTREMITY- LEFT FOOT;  Surgeon: Angelia Mould, MD;  Location: [place];  Service: Vascular;  Laterality: Left;  . I & D EXTREMITY Right 01/26/2020   Procedure: IRRIGATION AND DEBRIDEMENT EXTREMITY RIGHT FOOT WOUND;  Surgeon: Angelia Mould, MD;  Location: [place];  Service: Vascular;  Laterality: Right;  . INTRAOPERATIVE ARTERIOGRAM Right 09/10/2017   Procedure: INTRA OPERATIVE ARTERIOGRAM;  Surgeon: Angelia Mould, MD;  Location: [place];  Service: Vascular;  Laterality: Right;  . IR ANGIOGRAM FOLLOW UP STUDY  09/09/2017  . IR FLUORO GUIDE CV LINE RIGHT  05/31/2020  . IR US GUIDE VASC ACCESS RIGHT  06/01/2020  . LEFT HEART CATH  AND CORONARY ANGIOGRAPHY N/A 08/19/2020   Procedure: LEFT HEART CATH AND CORONARY ANGIOGRAPHY;  Surgeon: Adrian Prows, MD;  Location: [place] [facility];  Service: Cardiovascular;  Laterality: N/A;  . LEFT HEART CATHETERIZATION WITH CORONARY ANGIOGRAM N/A 11/09/2014   Procedure: LEFT HEART CATHETERIZATION WITH CORONARY ANGIOGRAM;  Surgeon: Laverda Page, MD;  Location: [facility] CATH LAB;  Service: Cardiovascular;  Laterality: N/A;  . LOWER EXTREMITY ANGIOGRAM Right 11/08/2015   Procedure: Lower Extremity Angiogram;  Surgeon: Serafina Mitchell, MD;  Location: [place] [facility];  Service: Cardiovascular;  Laterality: Right;  . LUMBAR LAMINECTOMY  06/13/2012   Procedure: MICRODISCECTOMY LUMBAR LAMINECTOMY;  Surgeon: Jessy Oto, MD;  Location: [place];  Service: Orthopedics;  Laterality: N/A;  Central laminectomy L2-3, L3-4, L4-5  . PACEMAKER INSERTION  10/08/14   MDT Advisa MRI compatible dual chamber pacemaker implanted by Dr Caryl Comes for syncope with post-termination pauses  . PERCUTANEOUS CORONARY STENT INTERVENTION (PCI-S)  11/09/2014   des to lad & distal circumflex         Dr  [name]  . PERIPHERAL VASCULAR  BALLOON ANGIOPLASTY Right 11/11/2019   Procedure: PERIPHERAL VASCULAR BALLOON ANGIOPLASTY;  Surgeon: Marty Heck, MD;  Location: [place] [facility];  Service: Cardiovascular;  Laterality: Right;  below knee popliteal, tibioperoneal trunk, posterior tibial arteries  . PERIPHERAL VASCULAR CATHETERIZATION N/A 11/08/2015   Procedure: Abdominal Aortogram;  Surgeon: Serafina Mitchell, MD;  Location: [place] [facility];  Service: Cardiovascular;  Laterality: N/A;  . PERIPHERAL VASCULAR CATHETERIZATION Right 11/08/2015   Procedure: Peripheral Vascular Atherectomy;  Surgeon: Serafina Mitchell, MD;  Location: [place] [facility];  Service: Cardiovascular;  Laterality: Right;  . PERIPHERAL VASCULAR CATHETERIZATION N/A 10/10/2016   Procedure: Lower Extremity Angiography;  Surgeon: Waynetta Sandy, MD;   Location: [place] [facility];  Service: Cardiovascular;  Laterality: N/A;  . PERIPHERAL VASCULAR CATHETERIZATION Right 10/10/2016   Procedure: Peripheral Vascular Atherectomy;  Surgeon: Waynetta Sandy, MD;  Location: [place] [facility];  Service: Cardiovascular;  Laterality: Right;  Popliteal  . PERMANENT PACEMAKER INSERTION N/A 10/08/2014   Procedure: PERMANENT PACEMAKER INSERTION;  Surgeon: Deboraha Sprang, MD;  Location: [facility] CATH LAB;  Service: Cardiovascular;  Laterality: N/A;  . SUBMUCOSAL TATTOO INJECTION  07/20/2020   Procedure: SUBMUCOSAL TATTOO INJECTION;  Surgeon: Irving Copas., MD;  Location: [place];  Service: Gastroenterology;;  . TEMPORARY PACEMAKER INSERTION Bilateral 10/07/2014   Procedure: TEMPORARY PACEMAKER INSERTION;  Surgeon: Laverda Page, MD;  Location: [facility] CATH LAB;  Service: Cardiovascular;  Laterality: Bilateral;  . WOUND DEBRIDEMENT Right 02/19/2020   Procedure: DEBRIDEMENT WOUND RIGHT FOOT;  Surgeon: Angelia Mould, MD;  Location: [place];  Service: Vascular;  Laterality: Right;    There were no vitals filed for this visit.   Subjective Assessment - 02/22/21 0847    Subjective He is walking in house with RW & prosthesis a lot on nondialysis days but is fatigued on dialysis days.    Pertinent History Rt TTA, artthritis, A-Fib, cardiac syncope, cataract, CKD, CAD, DM, ESRD w/HD since 7/21, HTN, PVD, cardiac pacemaker,    Limitations Walking;House hold activities    Patient Stated Goals to use prosthesis to walk in community, to hunt & fish, yardwork & garden    Currently in Pain? No/denies                             [facility] Adult PT Treatment/Exercise - 02/22/21 0847      Transfers   Transfers Sit to Stand;Stand to Sit    Sit to Stand 5: Supervision;With upper extremity assist;With armrests;From chair/3-in-1;Other (comment)   requires RW support for stabilization   Stand to Sit 5: Supervision;With upper extremity  assist;With armrests;To chair/3-in-1;Other (comment)   requires RW for stabilization     Ambulation/Gait   Ambulation/Gait Yes    Ambulation/Gait Assistance 5: Supervision;3: Mod assist   ModA cane & sup RW   Ambulation/Gait Assistance Details RW verbal cues on upright posture & wt shift over prosthesis in stance.  With cane manual & verbal cues for above plus balance reactions.    Ambulation Distance (Feet) 100 Feet   100' RW & 60' cane   Assistive device Rolling walker;Prosthesis;Straight cane   stand alone tip   Ambulation Surface Level;Indoor      High Level Balance   High Level Balance Activities --    High Level Balance Comments --      Neuro Re-ed    Neuro Re-ed Details  Sitting at edge of w/c without back or  arm support with feet on floor: alternating UEs & BUEs 10 reps ea rows, forward punch & overhead pull down.  Working on improving core stabilizations.  Pt verbalized & return demo understanding for HEP.      Exercises   Exercises Knee/Hip      Knee/Hip Exercises: Stretches   Active Hamstring Stretch --    Active Hamstring Stretch Limitations --      Knee/Hip Exercises: Aerobic   Nustep seat 11 level 7 with BLEs & BUEs 8 min      Knee/Hip Exercises: Standing   Forward Step Up --    Forward Step Up Limitations --    Step Down --    Step Down Limitations --    Rocker Board --    Rocker Board Limitations --      Knee/Hip Exercises: Seated   Sit to Sand 1 set;10 reps;without UE support   from 24" bar stool     Prosthetics   Prosthetic Care Comments  Fall risk if taking prosthesis off during awake hours.    Current prosthetic wear tolerance (days/week)  daily    Current prosthetic wear tolerance (#hours/day)  nondialysis days & after dialysis all awake hours except 2 hrs midday    Current prosthetic weight-bearing tolerance (hours/day)  Patient tolerated standing & gait with partial weight on prosthesis for 5 minutes with no c/o limb pain or discomfort.    Edema  Pitting edema with 5-7 sec capillary refill    Residual limb condition  No open areas, normal color, tempertaure & moisture, shape cylinderical    Education Provided Proper wear schedule/adjustment;Other (comment)   see prosthetic care comments   Person(s) Educated Patient    Education Method Explanation;Verbal cues    Education Method Verbalized understanding;Verbal cues required                    PT Short Term Goals - 02/01/21 1207      PT SHORT TERM GOAL #1   Title Patient verbalizes proper weighing & prosthetic issues with dialysis.    Time 1    Period Months    Status New    Target Date 03/03/21      PT SHORT TERM GOAL #2   Title Pateint tolerates prosthesis wear >90% of awake hours / day with no skin issues.    Time 1    Period Months    Status Revised    Target Date 03/03/21      PT SHORT TERM GOAL #3   Title Pateint is able to reach 10" anteriorly & pick up item from floor with cane safely.    Time 1    Period Months    Status Revised    Target Date 03/03/21      PT SHORT TERM GOAL #4   Title Pateint ambulates 300' with RW & prosthesis modified independent.    Time 1    Period Months    Status Revised    Target Date 03/03/21      PT SHORT TERM GOAL #5   Title Patient negotiates ramps & curbs with RW & prosthesis with supervision    Time 1    Period Months    Status Revised    Target Date 03/03/21      Additional Short Term Goals   Additional Short Term Goals Yes      PT SHORT TERM GOAL #6   Title Patient ambulates 20' with cane & prosthesis with minA  Time 4    Period Weeks    Status New    Target Date 03/03/21             PT Long Term Goals - 01/18/21 0941      PT LONG TERM GOAL #1   Title Patient verbalizes & demonstrates understanding of prosthetic care to enable safe utilization of prosthesis.    Time 3    Period Months    Status On-going    Target Date 04/03/21      PT LONG TERM GOAL #2   Title Patient tolerates wear of  prosthesis >90% of awake hours without skin or limb pain issues to enable function throughout his day.    Time 3    Period Months    Status On-going    Target Date 04/03/21      PT LONG TERM GOAL #3   Title Task of [place] Balance with RW  >/= 45/56    Time 3    Period Months    Status On-going    Target Date 04/03/21      PT LONG TERM GOAL #4   Title Patient ambulates >400' with LRAD & prosthesis modified independent.    Time 3    Period Months    Status On-going    Target Date 04/03/21      PT LONG TERM GOAL #5   Title Patient negotiates ramps, curbs & stairs with LRAD & prosthesis modified independent.    Time 3    Period Months    Status On-going    Target Date 04/03/21                 Plan - 02/22/21 0853    Clinical Impression Statement Patient reports that his back gets tired quickly.  PT added some UE resistive exercises to program when seated in w/c without back support which he appears to understand.    Personal Factors and Comorbidities Comorbidity 3+;Fitness;Time since onset of injury/illness/exacerbation;Past/Current Experience    Comorbidities Rt TTA, left Transmetatarsal Amputation, artthritis, A-Fib, cardiac syncope, cataract, CKD, CAD, DM, ESRD w/HD since 7/21, HTN, PVD, cardiac pacemaker,    Examination-Activity Limitations Locomotion Level;Squat;Stairs;Stand;Toileting;Transfers    Examination-Participation Restrictions Community Activity    Stability/Clinical Decision Making Evolving/Moderate complexity    Rehab Potential Good    PT Frequency 2x / week    PT Duration 12 weeks    PT Treatment/Interventions ADLs/Self Care Home Management;DME Instruction;Gait training;Stair training;Functional mobility training;Therapeutic activities;Therapeutic exercise;Balance training;Neuromuscular re-education;Patient/family education;Prosthetic Training;Vestibular    PT Next Visit Plan review prosthetic care, prosthetic gait household level with cane & community level  with RW including ramps & curbs. standing balance & strength    Consulted and Agree with Plan of Care Patient           Patient will benefit from skilled therapeutic intervention in order to improve the following deficits and impairments:  Abnormal gait,Cardiopulmonary status limiting activity,Decreased activity tolerance,Decreased balance,Decreased endurance,Decreased knowledge of use of DME,Decreased mobility,Decreased scar mobility,Decreased strength,Increased edema,Impaired flexibility,Postural dysfunction,Prosthetic Dependency,Obesity  Visit Diagnosis: Unsteadiness on feet  Abnormal posture  Foot drop, left  Other abnormalities of gait and mobility     Problem List Patient Active Problem List   Diagnosis Date Noted  . NSVT (nonsustained ventricular tachycardia) ([place])   . Pancytopenia ([place])   . Acute blood loss anemia 07/18/2020  . Hemodialysis patient ([place]) 06/04/2020  . Necrotizing glomerulonephritis 06/04/2020  . Heme positive stool   . Melena   . Upper GI  bleeding 05/23/2020  . Acute GI bleeding 05/23/2020  . Acute hematogenous osteomyelitis of right foot ([place]) 03/11/2020  . Gangrene of right foot ([place])   . Diabetic foot ulcer associated with type 2 diabetes mellitus ([place]) 02/19/2020  . Heel ulcer ([place]) 11/05/2019  . Atherosclerotic PVD with ulceration ([place]) 09/09/2017  . Essential hypertension, benign 02/07/2015  . Renal insufficiency 02/07/2015  . Post PTCA 11/09/2014  . S/P PTCA (percutaneous transluminal coronary angioplasty) 11/09/2014  . Pacemaker: Harper DR MRI [id number] Dual chamber pacemaker 10/08/2014 10/08/2014  . Encounter for care of pacemaker 10/08/2014  . Sinus node dysfunction ([place]) 10/08/2014  . Cardiac asystole ([place]) 10/07/2014  . Cardiac syncope 10/07/2014  . Diabetes mellitus with stage 3 chronic kidney disease ([place]) 10/07/2014  . CAD (coronary artery disease), native coronary artery 10/07/2014  . Diabetes mellitus due to underlying  condition without complications ([place]) 53/79/4327  . Anemia, unspecified 05/25/2014  . Syncope 04/30/2014  . Atherosclerosis of native arteries of the extremities with gangrene ([place]) 12/16/2013  . Volume overload 10/30/2013  . Acute renal failure ([place]) 10/28/2013  . PAD (peripheral artery disease) ([place]) 10/27/2013  . Leukocytosis 10/21/2013  . Anemia 10/21/2013  . Osteomyelitis of left foot ([place]) 08/19/2013  . Spinal stenosis, lumbar region, with neurogenic claudication 06/12/2012    Class: Diagnosis of  . DIABETES MELLITUS, TYPE II 03/15/2009  . ALCOHOL ABUSE 03/15/2009  . TOBACCO USE 03/15/2009  . HYPERTENSION 03/15/2009    Jamey Reas, PT, DPT 02/22/2021, 12:27 PM  [facility] Physical Therapy [address] [place], [place], [address] Phone: [phone]   Fax:  [phone]  Name: Alexander [name] MRN: [id number] Date of Birth: 10-05-1961

## 2021-02-23 DIAGNOSIS — N186 End stage renal disease: Secondary | ICD-10-CM | POA: Diagnosis not present

## 2021-02-23 DIAGNOSIS — N2581 Secondary hyperparathyroidism of renal origin: Secondary | ICD-10-CM | POA: Diagnosis not present

## 2021-02-23 DIAGNOSIS — Z992 Dependence on renal dialysis: Secondary | ICD-10-CM | POA: Diagnosis not present

## 2021-02-23 DIAGNOSIS — D631 Anemia in chronic kidney disease: Secondary | ICD-10-CM | POA: Diagnosis not present

## 2021-02-23 DIAGNOSIS — T8249XA Other complication of vascular dialysis catheter, initial encounter: Secondary | ICD-10-CM | POA: Diagnosis not present

## 2021-02-23 DIAGNOSIS — E0821 Diabetes mellitus due to underlying condition with diabetic nephropathy: Secondary | ICD-10-CM | POA: Diagnosis not present

## 2021-02-23 DIAGNOSIS — D689 Coagulation defect, unspecified: Secondary | ICD-10-CM | POA: Diagnosis not present

## 2021-02-23 DIAGNOSIS — D509 Iron deficiency anemia, unspecified: Secondary | ICD-10-CM | POA: Diagnosis not present

## 2021-02-25 DIAGNOSIS — Z992 Dependence on renal dialysis: Secondary | ICD-10-CM | POA: Diagnosis not present

## 2021-02-25 DIAGNOSIS — N186 End stage renal disease: Secondary | ICD-10-CM | POA: Diagnosis not present

## 2021-02-25 DIAGNOSIS — D689 Coagulation defect, unspecified: Secondary | ICD-10-CM | POA: Diagnosis not present

## 2021-02-25 DIAGNOSIS — D631 Anemia in chronic kidney disease: Secondary | ICD-10-CM | POA: Diagnosis not present

## 2021-02-25 DIAGNOSIS — D509 Iron deficiency anemia, unspecified: Secondary | ICD-10-CM | POA: Diagnosis not present

## 2021-02-25 DIAGNOSIS — N2581 Secondary hyperparathyroidism of renal origin: Secondary | ICD-10-CM | POA: Diagnosis not present

## 2021-02-25 DIAGNOSIS — T8249XA Other complication of vascular dialysis catheter, initial encounter: Secondary | ICD-10-CM | POA: Diagnosis not present

## 2021-02-25 DIAGNOSIS — E0821 Diabetes mellitus due to underlying condition with diabetic nephropathy: Secondary | ICD-10-CM | POA: Diagnosis not present

## 2021-02-26 ENCOUNTER — Other Ambulatory Visit: Payer: Self-pay | Admitting: Cardiology

## 2021-02-26 ENCOUNTER — Other Ambulatory Visit: Payer: Self-pay | Admitting: Student

## 2021-02-26 DIAGNOSIS — I10 Essential (primary) hypertension: Secondary | ICD-10-CM

## 2021-02-26 DIAGNOSIS — I2510 Atherosclerotic heart disease of native coronary artery without angina pectoris: Secondary | ICD-10-CM

## 2021-02-27 ENCOUNTER — Encounter: Payer: Medicare Other | Admitting: Physical Therapy

## 2021-02-28 DIAGNOSIS — E0821 Diabetes mellitus due to underlying condition with diabetic nephropathy: Secondary | ICD-10-CM | POA: Diagnosis not present

## 2021-02-28 DIAGNOSIS — T8249XA Other complication of vascular dialysis catheter, initial encounter: Secondary | ICD-10-CM | POA: Diagnosis not present

## 2021-02-28 DIAGNOSIS — D689 Coagulation defect, unspecified: Secondary | ICD-10-CM | POA: Diagnosis not present

## 2021-02-28 DIAGNOSIS — D509 Iron deficiency anemia, unspecified: Secondary | ICD-10-CM | POA: Diagnosis not present

## 2021-02-28 DIAGNOSIS — N2581 Secondary hyperparathyroidism of renal origin: Secondary | ICD-10-CM | POA: Diagnosis not present

## 2021-02-28 DIAGNOSIS — D631 Anemia in chronic kidney disease: Secondary | ICD-10-CM | POA: Diagnosis not present

## 2021-02-28 DIAGNOSIS — N186 End stage renal disease: Secondary | ICD-10-CM | POA: Diagnosis not present

## 2021-02-28 DIAGNOSIS — Z992 Dependence on renal dialysis: Secondary | ICD-10-CM | POA: Diagnosis not present

## 2021-03-01 ENCOUNTER — Ambulatory Visit (INDEPENDENT_AMBULATORY_CARE_PROVIDER_SITE_OTHER): Payer: Medicare Other | Admitting: Physical Therapy

## 2021-03-01 ENCOUNTER — Other Ambulatory Visit: Payer: Self-pay

## 2021-03-01 ENCOUNTER — Encounter: Payer: Self-pay | Admitting: Physical Therapy

## 2021-03-01 DIAGNOSIS — R293 Abnormal posture: Secondary | ICD-10-CM

## 2021-03-01 DIAGNOSIS — M21372 Foot drop, left foot: Secondary | ICD-10-CM | POA: Diagnosis not present

## 2021-03-01 DIAGNOSIS — R2681 Unsteadiness on feet: Secondary | ICD-10-CM | POA: Diagnosis not present

## 2021-03-01 DIAGNOSIS — R2689 Other abnormalities of gait and mobility: Secondary | ICD-10-CM

## 2021-03-01 NOTE — Therapy (Signed)
[facility] Physical Therapy [address] [place], [place], [address] Phone: [phone]   Fax:  [phone]  Physical Therapy Treatment  Patient Details  Name: Alexander [name] MRN: [id number] Date of Birth: 06/28/1961 Referring Provider (PT): Meridee Score, MD   Encounter Date: 03/01/2021   PT End of Session - 03/01/21 0849    Visit Number 15    Number of Visits 25    Date for PT Re-Evaluation 04/04/21    Authorization Type BCBS Medicare    Authorization Time Period $40 co-pay    PT Start Time 0847    PT Stop Time 0926    PT Time Calculation (min) 39 min    Equipment Utilized During Treatment Gait belt    Activity Tolerance Patient tolerated treatment well    Behavior During Therapy [facility] for tasks assessed/performed           Past Medical History:  Diagnosis Date  . Alcohol abuse   . Anemia   . Arthritis    "patient does not think so."  . Atrial fibrillation ([place])   . Cardiac syncope 10/07/14   rapid A fib with 8 sec pauses on converison with syncope- temp pacing wire placed then PPM  . Cataract    BILATERAL  . Chronic kidney disease   . Coronary artery disease   . Diabetes mellitus    dx---been  awhile  . Encounter for care of pacemaker 10/08/2014  . History of blood transfusion   . Hyperlipemia   . Hypertension   . Osteomyelitis ([place])    right foot  . Peripheral vascular disease ([place])   . Presence of permanent cardiac pacemaker 10/08/2014  . Sinus node dysfunction ([place]) 10/08/2014    Past Surgical History:  Procedure Laterality Date  . ABDOMINAL AORTOGRAM W/LOWER EXTREMITY N/A 09/09/2017   Procedure: ABDOMINAL AORTOGRAM W/LOWER EXTREMITY;  Surgeon: Angelia Mould, MD;  Location: [place] [facility];  Service: Cardiovascular;  Laterality: N/A;  . ABDOMINAL AORTOGRAM W/LOWER EXTREMITY Right 11/11/2019   Procedure: ABDOMINAL AORTOGRAM W/LOWER EXTREMITY;  Surgeon: Marty Heck, MD;  Location: [place] [facility];  Service:  Cardiovascular;  Laterality: Right;  . AMPUTATION Left 08/19/2013   Procedure: AMPUTATION RAY;  Surgeon: Alta Corning, MD;  Location: [place];  Service: Orthopedics;  Laterality: Left;  ray amputation left 5th  . AMPUTATION Left 10/27/2013   Procedure: AMPUTATION DIGIT-LEFT 4TH TOE, 4th and 5th metatarsal.;  Surgeon: Angelia Mould, MD;  Location: [place];  Service: Vascular;  Laterality: Left;  . AMPUTATION Left 11/04/2013   Procedure: LEFT FOOT TRANS-METATARSAL AMPUTATION WITH WOUND CLOSURE ;  Surgeon: Alta Corning, MD;  Location: [place];  Service: Orthopedics;  Laterality: Left;  . AMPUTATION Right 09/10/2017   Procedure: RIGHT FOURTH AND FIFTH TOE AMPUTATION;  Surgeon: Angelia Mould, MD;  Location: [place];  Service: Vascular;  Laterality: Right;  . AMPUTATION Right 02/19/2020   Procedure: AMPUTATION RIGHT 3RD TOE;  Surgeon: Angelia Mould, MD;  Location: [place];  Service: Vascular;  Laterality: Right;  . AMPUTATION Right 03/11/2020   Procedure: RIGHT BELOW KNEE AMPUTATION;  Surgeon: Newt Minion, MD;  Location: [place];  Service: Orthopedics;  Laterality: Right;  . APPLICATION OF WOUND VAC Right 02/19/2020   Procedure: Application Of Wound Vac Right foot;  Surgeon: Angelia Mould, MD;  Location: [place];  Service: Vascular;  Laterality: Right;  . BIOPSY  07/20/2020   Procedure: BIOPSY;  Surgeon: Rush Landmark Telford Nab., MD;  Location:  [place] [facility];  Service: Gastroenterology;;  . CARDIAC CATHETERIZATION  10/07/2014   Procedure: LEFT HEART CATH AND CORONARY ANGIOGRAPHY;  Surgeon: Laverda Page, MD;  Location: [facility];  Service: Cardiovascular;;  . COLONOSCOPY W/ POLYPECTOMY    . COLONOSCOPY WITH PROPOFOL N/A 05/25/2020   Procedure: COLONOSCOPY WITH PROPOFOL;  Surgeon: Gatha Mayer, MD;  Location: [facility];  Service: Endoscopy;  Laterality: N/A;  . EMBOLECTOMY Right 09/12/2017   Procedure: Thrombectomy  & Redo Right Below Knee Popliteal Artery Bypass Graft  ;   Surgeon: Waynetta Sandy, MD;  Location: [place];  Service: Vascular;  Laterality: Right;  . ENTEROSCOPY N/A 07/20/2020   Procedure: ENTEROSCOPY;  Surgeon: Mansouraty, Telford Nab., MD;  Location: [place];  Service: Gastroenterology;  Laterality: N/A;  . ESOPHAGOGASTRODUODENOSCOPY (EGD) WITH PROPOFOL N/A 05/24/2020   Procedure: ESOPHAGOGASTRODUODENOSCOPY (EGD) WITH PROPOFOL;  Surgeon: Ladene Artist, MD;  Location: [facility];  Service: Endoscopy;  Laterality: N/A;  . EYE SURGERY Bilateral    cataract  . FEMORAL-TIBIAL BYPASS GRAFT Left 10/27/2013   Procedure: BYPASS GRAFT LEFT FEMORAL- POSTERIOR TIBIAL ARTERY;  Surgeon: Angelia Mould, MD;  Location: [place];  Service: Vascular;  Laterality: Left;  . FEMORAL-TIBIAL BYPASS GRAFT Right 09/10/2017   Procedure: RIGHT SUPERFICIAL  FEMORAL ARTERY-BELOW KNEE POPLITEAL ARTERY BYPASS GRAFT WITH VEIN;  Surgeon: Angelia Mould, MD;  Location: [place];  Service: Vascular;  Laterality: Right;  . GIVENS CAPSULE STUDY N/A 07/20/2020   Procedure: GIVENS CAPSULE STUDY;  Surgeon: Irving Copas., MD;  Location: [place];  Service: Gastroenterology;  Laterality: N/A;  capsule placed through scope into duodenum @ 0936  . I & D EXTREMITY Left 10/27/2013   Procedure: IRRIGATION AND DEBRIDEMENT EXTREMITY- LEFT FOOT;  Surgeon: Angelia Mould, MD;  Location: [place];  Service: Vascular;  Laterality: Left;  . I & D EXTREMITY Right 01/26/2020   Procedure: IRRIGATION AND DEBRIDEMENT EXTREMITY RIGHT FOOT WOUND;  Surgeon: Angelia Mould, MD;  Location: [place];  Service: Vascular;  Laterality: Right;  . INTRAOPERATIVE ARTERIOGRAM Right 09/10/2017   Procedure: INTRA OPERATIVE ARTERIOGRAM;  Surgeon: Angelia Mould, MD;  Location: [place];  Service: Vascular;  Laterality: Right;  . IR ANGIOGRAM FOLLOW UP STUDY  09/09/2017  . IR FLUORO GUIDE CV LINE RIGHT  05/31/2020  . IR US GUIDE VASC ACCESS RIGHT  06/01/2020  . LEFT HEART CATH  AND CORONARY ANGIOGRAPHY N/A 08/19/2020   Procedure: LEFT HEART CATH AND CORONARY ANGIOGRAPHY;  Surgeon: Adrian Prows, MD;  Location: [place] [facility];  Service: Cardiovascular;  Laterality: N/A;  . LEFT HEART CATHETERIZATION WITH CORONARY ANGIOGRAM N/A 11/09/2014   Procedure: LEFT HEART CATHETERIZATION WITH CORONARY ANGIOGRAM;  Surgeon: Laverda Page, MD;  Location: [facility] CATH LAB;  Service: Cardiovascular;  Laterality: N/A;  . LOWER EXTREMITY ANGIOGRAM Right 11/08/2015   Procedure: Lower Extremity Angiogram;  Surgeon: Serafina Mitchell, MD;  Location: [place] [facility];  Service: Cardiovascular;  Laterality: Right;  . LUMBAR LAMINECTOMY  06/13/2012   Procedure: MICRODISCECTOMY LUMBAR LAMINECTOMY;  Surgeon: Jessy Oto, MD;  Location: [place];  Service: Orthopedics;  Laterality: N/A;  Central laminectomy L2-3, L3-4, L4-5  . PACEMAKER INSERTION  10/08/14   MDT Advisa MRI compatible dual chamber pacemaker implanted by Dr Caryl Comes for syncope with post-termination pauses  . PERCUTANEOUS CORONARY STENT INTERVENTION (PCI-S)  11/09/2014   des to lad & distal circumflex         Dr  [name]  . PERIPHERAL VASCULAR  BALLOON ANGIOPLASTY Right 11/11/2019   Procedure: PERIPHERAL VASCULAR BALLOON ANGIOPLASTY;  Surgeon: Marty Heck, MD;  Location: [place] [facility];  Service: Cardiovascular;  Laterality: Right;  below knee popliteal, tibioperoneal trunk, posterior tibial arteries  . PERIPHERAL VASCULAR CATHETERIZATION N/A 11/08/2015   Procedure: Abdominal Aortogram;  Surgeon: Serafina Mitchell, MD;  Location: [place] [facility];  Service: Cardiovascular;  Laterality: N/A;  . PERIPHERAL VASCULAR CATHETERIZATION Right 11/08/2015   Procedure: Peripheral Vascular Atherectomy;  Surgeon: Serafina Mitchell, MD;  Location: [place] [facility];  Service: Cardiovascular;  Laterality: Right;  . PERIPHERAL VASCULAR CATHETERIZATION N/A 10/10/2016   Procedure: Lower Extremity Angiography;  Surgeon: Waynetta Sandy, MD;   Location: [place] [facility];  Service: Cardiovascular;  Laterality: N/A;  . PERIPHERAL VASCULAR CATHETERIZATION Right 10/10/2016   Procedure: Peripheral Vascular Atherectomy;  Surgeon: Waynetta Sandy, MD;  Location: [place] [facility];  Service: Cardiovascular;  Laterality: Right;  Popliteal  . PERMANENT PACEMAKER INSERTION N/A 10/08/2014   Procedure: PERMANENT PACEMAKER INSERTION;  Surgeon: Deboraha Sprang, MD;  Location: [facility] CATH LAB;  Service: Cardiovascular;  Laterality: N/A;  . SUBMUCOSAL TATTOO INJECTION  07/20/2020   Procedure: SUBMUCOSAL TATTOO INJECTION;  Surgeon: Irving Copas., MD;  Location: [place];  Service: Gastroenterology;;  . TEMPORARY PACEMAKER INSERTION Bilateral 10/07/2014   Procedure: TEMPORARY PACEMAKER INSERTION;  Surgeon: Laverda Page, MD;  Location: [facility] CATH LAB;  Service: Cardiovascular;  Laterality: Bilateral;  . WOUND DEBRIDEMENT Right 02/19/2020   Procedure: DEBRIDEMENT WOUND RIGHT FOOT;  Surgeon: Angelia Mould, MD;  Location: [place];  Service: Vascular;  Laterality: Right;    There were no vitals filed for this visit.   Subjective Assessment - 03/01/21 0850    Subjective He traveled to [facility] for Easter. They stopped frequently but otherwise no new functions arose.  He is wearing prosthesis all awake hours.    Pertinent History Rt TTA, artthritis, A-Fib, cardiac syncope, cataract, CKD, CAD, DM, ESRD w/HD since 7/21, HTN, PVD, cardiac pacemaker,    Limitations Walking;House hold activities    Patient Stated Goals to use prosthesis to walk in community, to hunt & fish, yardwork & garden    Currently in Pain? No/denies                             [facility] Adult PT Treatment/Exercise - 03/01/21 0847      Transfers   Transfers Sit to Stand;Stand to Sit    Sit to Stand 5: Supervision;With upper extremity assist;With armrests;From chair/3-in-1;Other (comment)   to forearm crutches   Sit to Stand Details Visual cues for  safe use of DME/AE;Verbal cues for safe use of DME/AE    Stand to Sit 5: Supervision;With upper extremity assist;With armrests;To chair/3-in-1;Other (comment)   from forearm crutches   Stand to Sit Details (indicate cue type and reason) Visual cues for safe use of DME/AE;Verbal cues for safe use of DME/AE      Ambulation/Gait   Ambulation/Gait Yes    Ambulation/Gait Assistance 5: Supervision    Ambulation/Gait Assistance Details PT verbal & demo cues on prosthetic gait with forearm crutches progressed from 3-pt to 2-pt pattern.  PT instructed that typically crutches are easier on gravel like his driveway & grass than RW but still enables BUE support.    Ambulation Distance (Feet) 125 Feet   125' & 100'   Assistive device Prosthesis;Lofstrands    Ambulation Surface Level;Indoor  Stairs Yes    Stairs Assistance 5: Supervision;4: Min guard    Stairs Assistance Details (indicate cue type and reason) demo & verbal cues on technique with single crutch & rail    Stair Management Technique One rail Right;One rail Left;With crutches;Step to pattern;Forwards    Number of Stairs 4   2w/ rt rail & 2w/ lt rail   Height of Stairs 6    Ramp 4: Min assist   Min guard with bil. forearm crutches & TTA prosthesis   Ramp Details (indicate cue type and reason) verbal & tactile cues on technique    Curb 4: Min assist   Min guard with bil. forearm crutches & TTA prosthesis   Curb Details (indicate cue type and reason) verbal & tactile cues on technique      Exercises   Exercises Knee/Hip      Knee/Hip Exercises: Aerobic   Nustep seat 11 level 7 with BLEs & BUEs 8 min      Knee/Hip Exercises: Seated   Sit to Sand --      Prosthetics   Prosthetic Care Comments  --    Current prosthetic wear tolerance (days/week)  daily    Current prosthetic wear tolerance (#hours/day)  nondialysis days & after dialysis all awake hours except 2 hrs midday    Current prosthetic weight-bearing tolerance (hours/day)  Patient  tolerated standing & gait with partial weight on prosthesis for 5 minutes with no c/o limb pain or discomfort.    Edema Pitting edema with 5-7 sec capillary refill    Residual limb condition  No open areas, normal color, tempertaure & moisture, shape cylinderical    Education Provided Proper wear schedule/adjustment                    PT Short Term Goals - 03/01/21 1402      PT SHORT TERM GOAL #1   Title Patient verbalizes proper weighing & prosthetic issues with dialysis.    Time 1    Period Months    Status Achieved    Target Date 03/03/21      PT SHORT TERM GOAL #2   Title Pateint tolerates prosthesis wear >90% of awake hours / day with no skin issues.    Time 1    Period Months    Status Achieved    Target Date 03/03/21      PT SHORT TERM GOAL #3   Title Pateint is able to reach 10" anteriorly & pick up item from floor with cane safely.    Time 1    Period Months    Status Not Met    Target Date 03/03/21      PT SHORT TERM GOAL #4   Title Pateint ambulates 300' with RW & prosthesis modified independent.    Time 1    Period Months    Status On-going    Target Date 03/03/21      PT SHORT TERM GOAL #5   Title Patient negotiates ramps & curbs with RW & prosthesis with supervision    Time 1    Period Months    Status Achieved    Target Date 03/03/21      PT SHORT TERM GOAL #6   Title Patient ambulates 72' with cane & prosthesis with minA    Time 4    Period Weeks    Status On-going    Target Date 03/03/21  PT Long Term Goals - 01/18/21 0941      PT LONG TERM GOAL #1   Title Patient verbalizes & demonstrates understanding of prosthetic care to enable safe utilization of prosthesis.    Time 3    Period Months    Status On-going    Target Date 04/03/21      PT LONG TERM GOAL #2   Title Patient tolerates wear of prosthesis >90% of awake hours without skin or limb pain issues to enable function throughout his day.    Time 3    Period  Months    Status On-going    Target Date 04/03/21      PT LONG TERM GOAL #3   Title Task of [place] Balance with RW  >/= 45/56    Time 3    Period Months    Status On-going    Target Date 04/03/21      PT LONG TERM GOAL #4   Title Patient ambulates >400' with LRAD & prosthesis modified independent.    Time 3    Period Months    Status On-going    Target Date 04/03/21      PT LONG TERM GOAL #5   Title Patient negotiates ramps, curbs & stairs with LRAD & prosthesis modified independent.    Time 3    Period Months    Status On-going    Target Date 04/03/21                 Plan - 03/01/21 0850    Clinical Impression Statement PT introduced prosthetic gait with forearm crutches as more functional on gravel like his driveway or grass.  He has an appt with vascular doctors on May 4 to determine where his dialysis access point will be placed.  If it is in his upper arm then he will not be able to use axillary crutches & if in his forearm than he will not be able to use forearm crutches.    Personal Factors and Comorbidities Comorbidity 3+;Fitness;Time since onset of injury/illness/exacerbation;Past/Current Experience    Comorbidities Rt TTA, left Transmetatarsal Amputation, artthritis, A-Fib, cardiac syncope, cataract, CKD, CAD, DM, ESRD w/HD since 7/21, HTN, PVD, cardiac pacemaker,    Examination-Activity Limitations Locomotion Level;Squat;Stairs;Stand;Toileting;Transfers    Examination-Participation Restrictions Community Activity    Stability/Clinical Decision Making Evolving/Moderate complexity    Rehab Potential Good    PT Frequency 2x / week    PT Duration 12 weeks    PT Treatment/Interventions ADLs/Self Care Home Management;DME Instruction;Gait training;Stair training;Functional mobility training;Therapeutic activities;Therapeutic exercise;Balance training;Neuromuscular re-education;Patient/family education;Prosthetic Training;Vestibular    PT Next Visit Plan review  prosthetic care, prosthetic gait household level with cane & community level with forearm crutches including ramps & curbs. standing balance & strength    Consulted and Agree with Plan of Care Patient           Patient will benefit from skilled therapeutic intervention in order to improve the following deficits and impairments:  Abnormal gait,Cardiopulmonary status limiting activity,Decreased activity tolerance,Decreased balance,Decreased endurance,Decreased knowledge of use of DME,Decreased mobility,Decreased scar mobility,Decreased strength,Increased edema,Impaired flexibility,Postural dysfunction,Prosthetic Dependency,Obesity  Visit Diagnosis: Unsteadiness on feet  Abnormal posture  Foot drop, left  Other abnormalities of gait and mobility     Problem List Patient Active Problem List   Diagnosis Date Noted  . NSVT (nonsustained ventricular tachycardia) ([place])   . Pancytopenia ([place])   . Acute blood loss anemia 07/18/2020  . Hemodialysis patient ([place]) 06/04/2020  . Necrotizing glomerulonephritis 06/04/2020  .  Heme positive stool   . Melena   . Upper GI bleeding 05/23/2020  . Acute GI bleeding 05/23/2020  . Acute hematogenous osteomyelitis of right foot ([place]) 03/11/2020  . Gangrene of right foot ([place])   . Diabetic foot ulcer associated with type 2 diabetes mellitus ([place]) 02/19/2020  . Heel ulcer ([place]) 11/05/2019  . Atherosclerotic PVD with ulceration ([place]) 09/09/2017  . Essential hypertension, benign 02/07/2015  . Renal insufficiency 02/07/2015  . Post PTCA 11/09/2014  . S/P PTCA (percutaneous transluminal coronary angioplasty) 11/09/2014  . Pacemaker: Miller DR MRI [id number] Dual chamber pacemaker 10/08/2014 10/08/2014  . Encounter for care of pacemaker 10/08/2014  . Sinus node dysfunction ([place]) 10/08/2014  . Cardiac asystole ([place]) 10/07/2014  . Cardiac syncope 10/07/2014  . Diabetes mellitus with stage 3 chronic kidney disease ([place]) 10/07/2014  . CAD (coronary  artery disease), native coronary artery 10/07/2014  . Diabetes mellitus due to underlying condition without complications ([place]) 78/41/2820  . Anemia, unspecified 05/25/2014  . Syncope 04/30/2014  . Atherosclerosis of native arteries of the extremities with gangrene ([place]) 12/16/2013  . Volume overload 10/30/2013  . Acute renal failure ([place]) 10/28/2013  . PAD (peripheral artery disease) ([place]) 10/27/2013  . Leukocytosis 10/21/2013  . Anemia 10/21/2013  . Osteomyelitis of left foot ([place]) 08/19/2013  . Spinal stenosis, lumbar region, with neurogenic claudication 06/12/2012    Class: Diagnosis of  . DIABETES MELLITUS, TYPE II 03/15/2009  . ALCOHOL ABUSE 03/15/2009  . TOBACCO USE 03/15/2009  . HYPERTENSION 03/15/2009    Jamey Reas, PT, DPT 03/01/2021, 2:07 PM  [facility] Physical Therapy [address] [place], [place], [address] Phone: [phone]   Fax:  [phone]  Name: ADOLFO GRANIERI MRN: [id number] Date of Birth: 1961-03-02

## 2021-03-02 DIAGNOSIS — D509 Iron deficiency anemia, unspecified: Secondary | ICD-10-CM | POA: Diagnosis not present

## 2021-03-02 DIAGNOSIS — Z992 Dependence on renal dialysis: Secondary | ICD-10-CM | POA: Diagnosis not present

## 2021-03-02 DIAGNOSIS — D689 Coagulation defect, unspecified: Secondary | ICD-10-CM | POA: Diagnosis not present

## 2021-03-02 DIAGNOSIS — N186 End stage renal disease: Secondary | ICD-10-CM | POA: Diagnosis not present

## 2021-03-02 DIAGNOSIS — N2581 Secondary hyperparathyroidism of renal origin: Secondary | ICD-10-CM | POA: Diagnosis not present

## 2021-03-02 DIAGNOSIS — E0821 Diabetes mellitus due to underlying condition with diabetic nephropathy: Secondary | ICD-10-CM | POA: Diagnosis not present

## 2021-03-02 DIAGNOSIS — D631 Anemia in chronic kidney disease: Secondary | ICD-10-CM | POA: Diagnosis not present

## 2021-03-02 DIAGNOSIS — T8249XA Other complication of vascular dialysis catheter, initial encounter: Secondary | ICD-10-CM | POA: Diagnosis not present

## 2021-03-04 DIAGNOSIS — N186 End stage renal disease: Secondary | ICD-10-CM | POA: Diagnosis not present

## 2021-03-04 DIAGNOSIS — D689 Coagulation defect, unspecified: Secondary | ICD-10-CM | POA: Diagnosis not present

## 2021-03-04 DIAGNOSIS — D631 Anemia in chronic kidney disease: Secondary | ICD-10-CM | POA: Diagnosis not present

## 2021-03-04 DIAGNOSIS — N2581 Secondary hyperparathyroidism of renal origin: Secondary | ICD-10-CM | POA: Diagnosis not present

## 2021-03-04 DIAGNOSIS — Z992 Dependence on renal dialysis: Secondary | ICD-10-CM | POA: Diagnosis not present

## 2021-03-04 DIAGNOSIS — D509 Iron deficiency anemia, unspecified: Secondary | ICD-10-CM | POA: Diagnosis not present

## 2021-03-04 DIAGNOSIS — E0821 Diabetes mellitus due to underlying condition with diabetic nephropathy: Secondary | ICD-10-CM | POA: Diagnosis not present

## 2021-03-04 DIAGNOSIS — T8249XA Other complication of vascular dialysis catheter, initial encounter: Secondary | ICD-10-CM | POA: Diagnosis not present

## 2021-03-06 ENCOUNTER — Other Ambulatory Visit: Payer: Self-pay

## 2021-03-06 ENCOUNTER — Ambulatory Visit (INDEPENDENT_AMBULATORY_CARE_PROVIDER_SITE_OTHER): Payer: Medicare Other | Admitting: Physical Therapy

## 2021-03-06 ENCOUNTER — Encounter: Payer: Self-pay | Admitting: Physical Therapy

## 2021-03-06 DIAGNOSIS — R2689 Other abnormalities of gait and mobility: Secondary | ICD-10-CM

## 2021-03-06 DIAGNOSIS — R2681 Unsteadiness on feet: Secondary | ICD-10-CM

## 2021-03-06 DIAGNOSIS — M21372 Foot drop, left foot: Secondary | ICD-10-CM

## 2021-03-06 DIAGNOSIS — R293 Abnormal posture: Secondary | ICD-10-CM

## 2021-03-06 DIAGNOSIS — I739 Peripheral vascular disease, unspecified: Secondary | ICD-10-CM

## 2021-03-06 NOTE — Therapy (Signed)
[facility] Physical Therapy [address] [place], [place], [address] Phone: [phone]   Fax:  [phone]  Physical Therapy Treatment  Patient Details  Name: Alexander [name] MRN: [id number] Date of Birth: [birth year] Referring Provider (PT): Meridee Score, MD   Encounter Date: 03/06/2021   PT End of Session - 03/06/21 0854    Visit Number 16    Number of Visits 25    Date for PT Re-Evaluation 04/04/21    Authorization Type BCBS Medicare    Authorization Time Period $40 co-pay    PT Start Time [phone]    PT Stop Time 0930    PT Time Calculation (min) 39 min    Equipment Utilized During Treatment Gait belt    Activity Tolerance Patient tolerated treatment well    Behavior During Therapy [facility] for tasks assessed/performed           Past Medical History:  Diagnosis Date  . Alcohol abuse   . Anemia   . Arthritis    "patient does not think so."  . Atrial fibrillation ([place])   . Cardiac syncope 10/07/14   rapid A fib with 8 sec pauses on converison with syncope- temp pacing wire placed then PPM  . Cataract    BILATERAL  . Chronic kidney disease   . Coronary artery disease   . Diabetes mellitus    dx---been  awhile  . Encounter for care of pacemaker 10/08/2014  . History of blood transfusion   . Hyperlipemia   . Hypertension   . Osteomyelitis ([place])    right foot  . Peripheral vascular disease ([place])   . Presence of permanent cardiac pacemaker 10/08/2014  . Sinus node dysfunction ([place]) 10/08/2014    Past Surgical History:  Procedure Laterality Date  . ABDOMINAL AORTOGRAM W/LOWER EXTREMITY N/A 09/09/2017   Procedure: ABDOMINAL AORTOGRAM W/LOWER EXTREMITY;  Surgeon: Angelia Mould, MD;  Location: [place] CV LAB;  Service: Cardiovascular;  Laterality: N/A;  . ABDOMINAL AORTOGRAM W/LOWER EXTREMITY Right 11/11/2019   Procedure: ABDOMINAL AORTOGRAM W/LOWER EXTREMITY;  Surgeon: Marty Heck, MD;  Location: [place] CV LAB;  Service:  Cardiovascular;  Laterality: Right;  . AMPUTATION Left 08/19/2013   Procedure: AMPUTATION RAY;  Surgeon: Alta Corning, MD;  Location: [place];  Service: Orthopedics;  Laterality: Left;  ray amputation left 5th  . AMPUTATION Left 10/27/2013   Procedure: AMPUTATION DIGIT-LEFT 4TH TOE, 4th and 5th metatarsal.;  Surgeon: Angelia Mould, MD;  Location: [place];  Service: Vascular;  Laterality: Left;  . AMPUTATION Left 11/04/2013   Procedure: LEFT FOOT TRANS-METATARSAL AMPUTATION WITH WOUND CLOSURE ;  Surgeon: Alta Corning, MD;  Location: [place];  Service: Orthopedics;  Laterality: Left;  . AMPUTATION Right 09/10/2017   Procedure: RIGHT FOURTH AND FIFTH TOE AMPUTATION;  Surgeon: Angelia Mould, MD;  Location: [place];  Service: Vascular;  Laterality: Right;  . AMPUTATION Right 02/19/2020   Procedure: AMPUTATION RIGHT 3RD TOE;  Surgeon: Angelia Mould, MD;  Location: [place];  Service: Vascular;  Laterality: Right;  . AMPUTATION Right 03/11/2020   Procedure: RIGHT BELOW KNEE AMPUTATION;  Surgeon: Newt Minion, MD;  Location: [place];  Service: Orthopedics;  Laterality: Right;  . APPLICATION OF WOUND VAC Right 02/19/2020   Procedure: Application Of Wound Vac Right foot;  Surgeon: Angelia Mould, MD;  Location: [place];  Service: Vascular;  Laterality: Right;  . BIOPSY  07/20/2020   Procedure: BIOPSY;  Surgeon: Rush Landmark Telford Nab., MD;  Location:  [place] ENDOSCOPY;  Service: Gastroenterology;;  . CARDIAC CATHETERIZATION  10/07/2014   Procedure: LEFT HEART CATH AND CORONARY ANGIOGRAPHY;  Surgeon: Laverda Page, MD;  Location: [facility] CATH LAB;  Service: Cardiovascular;;  . COLONOSCOPY W/ POLYPECTOMY    . COLONOSCOPY WITH PROPOFOL N/A 05/25/2020   Procedure: COLONOSCOPY WITH PROPOFOL;  Surgeon: Gatha Mayer, MD;  Location: [facility]OSCOPY;  Service: Endoscopy;  Laterality: N/A;  . EMBOLECTOMY Right 09/12/2017   Procedure: Thrombectomy  & Redo Right Below Knee Popliteal Artery Bypass Graft  ;   Surgeon: Waynetta Sandy, MD;  Location: [place];  Service: Vascular;  Laterality: Right;  . ENTEROSCOPY N/A 07/20/2020   Procedure: ENTEROSCOPY;  Surgeon: Mansouraty, Telford Nab., MD;  Location: [place];  Service: Gastroenterology;  Laterality: N/A;  . ESOPHAGOGASTRODUODENOSCOPY (EGD) WITH PROPOFOL N/A 05/24/2020   Procedure: ESOPHAGOGASTRODUODENOSCOPY (EGD) WITH PROPOFOL;  Surgeon: Ladene Artist, MD;  Location: [facility]OS[facility];  Service: Endoscopy;  Laterality: N/A;  . EYE SURGERY Bilateral    cataract  . FEMORAL-TIBIAL BYPASS GRAFT Left 10/27/2013   Procedure: BYPASS GRAFT LEFT FEMORAL- POSTERIOR TIBIAL ARTERY;  Surgeon: Angelia Mould, MD;  Location: [place];  Service: Vascular;  Laterality: Left;  . FEMORAL-TIBIAL BYPASS GRAFT Right 09/10/2017   Procedure: RIGHT SUPERFICIAL  FEMORAL ARTERY-BELOW KNEE POPLITEAL ARTERY BYPASS GRAFT WITH VEIN;  Surgeon: Angelia Mould, MD;  Location: [place];  Service: Vascular;  Laterality: Right;  . GIVENS CAPSULE STUDY N/A 07/20/2020   Procedure: GIVENS CAPSULE STUDY;  Surgeon: Irving Copas., MD;  Location: [place];  Service: Gastroenterology;  Laterality: N/A;  capsule placed through scope into duodenum @ 0936  . I & D EXTREMITY Left 10/27/2013   Procedure: IRRIGATION AND DEBRIDEMENT EXTREMITY- LEFT FOOT;  Surgeon: Angelia Mould, MD;  Location: [place];  Service: Vascular;  Laterality: Left;  . I & D EXTREMITY Right 01/26/2020   Procedure: IRRIGATION AND DEBRIDEMENT EXTREMITY RIGHT FOOT WOUND;  Surgeon: Angelia Mould, MD;  Location: [place];  Service: Vascular;  Laterality: Right;  . INTRAOPERATIVE ARTERIOGRAM Right 09/10/2017   Procedure: INTRA OPERATIVE ARTERIOGRAM;  Surgeon: Angelia Mould, MD;  Location: [place];  Service: Vascular;  Laterality: Right;  . IR ANGIOGRAM FOLLOW UP STUDY  09/09/2017  . IR FLUORO GUIDE CV LINE RIGHT  05/31/2020  . IR US GUIDE VASC ACCESS RIGHT  06/01/2020  . LEFT HEART CATH  AND CORONARY ANGIOGRAPHY N/A 08/19/2020   Procedure: LEFT HEART CATH AND CORONARY ANGIOGRAPHY;  Surgeon: Adrian Prows, MD;  Location: [place] CV LAB;  Service: Cardiovascular;  Laterality: N/A;  . LEFT HEART CATHETERIZATION WITH CORONARY ANGIOGRAM N/A 11/09/2014   Procedure: LEFT HEART CATHETERIZATION WITH CORONARY ANGIOGRAM;  Surgeon: Laverda Page, MD;  Location: [facility] CATH LAB;  Service: Cardiovascular;  Laterality: N/A;  . LOWER EXTREMITY ANGIOGRAM Right 11/08/2015   Procedure: Lower Extremity Angiogram;  Surgeon: Serafina Mitchell, MD;  Location: [place] CV LAB;  Service: Cardiovascular;  Laterality: Right;  . LUMBAR LAMINECTOMY  06/13/2012   Procedure: MICRODISCECTOMY LUMBAR LAMINECTOMY;  Surgeon: Jessy Oto, MD;  Location: [place];  Service: Orthopedics;  Laterality: N/A;  Central laminectomy L2-3, L3-4, L4-5  . PACEMAKER INSERTION  10/08/14   MDT Advisa MRI compatible dual chamber pacemaker implanted by Dr Caryl Comes for syncope with post-termination pauses  . PERCUTANEOUS CORONARY STENT INTERVENTION (PCI-S)  11/09/2014   des to lad & distal circumflex         Dr  [name]  . PERIPHERAL VASCULAR  BALLOON ANGIOPLASTY Right 11/11/2019   Procedure: PERIPHERAL VASCULAR BALLOON ANGIOPLASTY;  Surgeon: Cephus Shelling, MD;  Location: [facility] INVASIVE CV LAB;  Service: Cardiovascular;  Laterality: Right;  below knee popliteal, tibioperoneal trunk, posterior tibial arteries  . PERIPHERAL VASCULAR CATHETERIZATION N/A 11/08/2015   Procedure: Abdominal Aortogram;  Surgeon: Nada Libman, MD;  Location: [facility] INVASIVE CV LAB;  Service: Cardiovascular;  Laterality: N/A;  . PERIPHERAL VASCULAR CATHETERIZATION Right 11/08/2015   Procedure: Peripheral Vascular Atherectomy;  Surgeon: Nada Libman, MD;  Location: [facility]AS[facility] CV LAB;  Service: Cardiovascular;  Laterality: Right;  . PERIPHERAL VASCULAR CATHETERIZATION N/A 10/10/2016   Procedure: Lower Extremity Angiography;  Surgeon: Maeola Harman, MD;   Location: [facility] INVASIVE CV LAB;  Service: Cardiovascular;  Laterality: N/A;  . PERIPHERAL VASCULAR CATHETERIZATION Right 10/10/2016   Procedure: Peripheral Vascular Atherectomy;  Surgeon: Maeola Harman, MD;  Location: [facility] INVASIVE CV LAB;  Service: Cardiovascular;  Laterality: Right;  Popliteal  . PERMANENT PACEMAKER INSERTION N/A 10/08/2014   Procedure: PERMANENT PACEMAKER INSERTION;  Surgeon: Duke Salvia, MD;  Location: [facility] CATH LAB;  Service: Cardiovascular;  Laterality: N/A;  . SUBMUCOSAL TATTOO INJECTION  07/20/2020   Procedure: SUBMUCOSAL TATTOO INJECTION;  Surgeon: Lemar Lofty., MD;  Location: [facility] ENDOSCOPY;  Service: Gastroenterology;;  . TEMPORARY PACEMAKER INSERTION Bilateral 10/07/2014   Procedure: TEMPORARY PACEMAKER INSERTION;  Surgeon: Pamella Pert, MD;  Location: [facility] CATH LAB;  Service: Cardiovascular;  Laterality: Bilateral;  . WOUND DEBRIDEMENT Right 02/19/2020   Procedure: DEBRIDEMENT WOUND RIGHT FOOT;  Surgeon: Chuck Hint, MD;  Location: [facility] OR;  Service: Vascular;  Laterality: Right;    There were no vitals filed for this visit.   Subjective Assessment - 03/06/21 0851    Subjective He is wearing prosthesis all awake hours including dialysis without issues.    Pertinent History Rt TTA, artthritis, A-Fib, cardiac syncope, cataract, CKD, CAD, DM, ESRD w/HD since 7/21, HTN, PVD, cardiac pacemaker,    Limitations Walking;House hold activities    Patient Stated Goals to use prosthesis to walk in community, to hunt & fish, yardwork & garden    Currently in Pain? No/denies              [facility] PT Assessment - 03/06/21 0851      Assessment   Medical Diagnosis Right Transtibial Amputation    Referring Provider (PT) Aldean Baker, MD                         [facility] Adult PT Treatment/Exercise - 03/06/21 0851      Transfers   Transfers Sit to Stand;Stand to Sit    Sit to Stand With upper extremity assist;With armrests;From  chair/3-in-1;Other (comment);6: Modified independent (Device/Increase time)   to RW   Sit to Stand Details --    Stand to Sit 6: Modified independent (Device/Increase time);With upper extremity assist;With armrests;To chair/3-in-1;Other (comment)   from RW   Stand to Sit Details (indicate cue type and reason) --      Ambulation/Gait   Ambulation/Gait Yes    Ambulation/Gait Assistance 6: Modified independent (Device/Increase time);4: Min assist   mod ind RW & minA cane   Ambulation Distance (Feet) 300 Feet   300' max distance with RW & 40' with cane stand alone tip   Assistive device Prosthesis;Straight cane;Rolling walker    Ambulation Surface Level;Indoor    Stairs --    [employer] --    Stair Management Technique --  Number of Stairs --    Height of Stairs --    Ramp 6: Modified independent (Device)   RW & TTA prosthesis   Curb 6: Modified independent (Device/increase time)   RW & TTA prosthesis     Self-Care   Lifting Pt able to pick up items from floor with RW support.      Therapeutic Activites    Therapeutic Activities ADL's    ADL's Patient able to reach 10" with RW support safely.      Neuro Re-ed    Neuro Re-ed Details  Patient able to stand feet hip width apart of floor 60 sec with supervision.  Standing with eyes closed with min guard / tactile cues for 10 sec.      Exercises   Exercises Knee/Hip      Knee/Hip Exercises: Aerobic   Nustep seat 11 level 7 with BLEs & BUEs 6 min      Prosthetics   Current prosthetic wear tolerance (days/week)  daily    Current prosthetic wear tolerance (#hours/day)  nondialysis days & after dialysis all awake hours except 2 hrs midday    Current prosthetic weight-bearing tolerance (hours/day)  Patient tolerated standing & gait with partial weight on prosthesis for 5 minutes with no c/o limb pain or discomfort.    Edema Pitting edema with 5-7 sec capillary refill    Residual limb condition  No open areas, normal color,  tempertaure & moisture, shape cylinderical    Education Provided Proper wear schedule/adjustment                    PT Short Term Goals - 03/06/21 1609      PT SHORT TERM GOAL #1   Title Patient verbalizes proper weighing & prosthetic issues with dialysis.    Time 1    Period Months    Status Achieved    Target Date 03/03/21      PT SHORT TERM GOAL #2   Title Pateint tolerates prosthesis wear >90% of awake hours / day with no skin issues.    Time 1    Period Months    Status Achieved    Target Date 03/03/21      PT SHORT TERM GOAL #3   Title Pateint is able to reach 10" anteriorly & pick up item from floor with RW safely.    Time 1    Period Months    Status Achieved    Target Date 03/03/21      PT SHORT TERM GOAL #4   Title Pateint ambulates 300' with RW & prosthesis modified independent.    Time 1    Period Months    Status Achieved    Target Date 03/03/21      PT SHORT TERM GOAL #5   Title Patient negotiates ramps & curbs with RW & prosthesis with supervision    Time 1    Period Months    Status Achieved    Target Date 03/03/21      PT SHORT TERM GOAL #6   Title Patient ambulates 80' with cane & prosthesis with minA    Time 4    Period Weeks    Status Achieved    Target Date 03/03/21             PT Long Term Goals - 01/18/21 0941      PT LONG TERM GOAL #1   Title Patient verbalizes & demonstrates understanding of prosthetic care to enable safe  utilization of prosthesis.    Time 3    Period Months    Status On-going    Target Date 04/03/21      PT LONG TERM GOAL #2   Title Patient tolerates wear of prosthesis >90% of awake hours without skin or limb pain issues to enable function throughout his day.    Time 3    Period Months    Status On-going    Target Date 04/03/21      PT LONG TERM GOAL #3   Title Task of [place] Balance with RW  >/= 45/56    Time 3    Period Months    Status On-going    Target Date 04/03/21      PT LONG TERM  GOAL #4   Title Patient ambulates >400' with LRAD & prosthesis modified independent.    Time 3    Period Months    Status On-going    Target Date 04/03/21      PT LONG TERM GOAL #5   Title Patient negotiates ramps, curbs & stairs with LRAD & prosthesis modified independent.    Time 3    Period Months    Status On-going    Target Date 04/03/21                 Plan - 03/06/21 0855    Clinical Impression Statement Patient met STGs. He is progressing slowly as not able to perform as much activity as PT has recommended outside of PT.  He would benefit from crutches to increase function outdoors like his gravel driveway but PT needs to wait until vascular surgeon determines where he is going to place his fistula.    Personal Factors and Comorbidities Comorbidity 3+;Fitness;Time since onset of injury/illness/exacerbation;Past/Current Experience    Comorbidities Rt TTA, left Transmetatarsal Amputation, artthritis, A-Fib, cardiac syncope, cataract, CKD, CAD, DM, ESRD w/HD since 7/21, HTN, PVD, cardiac pacemaker,    Examination-Activity Limitations Locomotion Level;Squat;Stairs;Stand;Toileting;Transfers    Examination-Participation Restrictions Community Activity    Stability/Clinical Decision Making Evolving/Moderate complexity    Rehab Potential Good    PT Frequency 2x / week    PT Duration 12 weeks    PT Treatment/Interventions ADLs/Self Care Home Management;DME Instruction;Gait training;Stair training;Functional mobility training;Therapeutic activities;Therapeutic exercise;Balance training;Neuromuscular re-education;Patient/family education;Prosthetic Training;Vestibular    PT Next Visit Plan review prosthetic care, prosthetic gait household level with cane & community level with forearm crutches including ramps & curbs. standing balance & strength    Consulted and Agree with Plan of Care Patient           Patient will benefit from skilled therapeutic intervention in order to  improve the following deficits and impairments:  Abnormal gait,Cardiopulmonary status limiting activity,Decreased activity tolerance,Decreased balance,Decreased endurance,Decreased knowledge of use of DME,Decreased mobility,Decreased scar mobility,Decreased strength,Increased edema,Impaired flexibility,Postural dysfunction,Prosthetic Dependency,Obesity  Visit Diagnosis: Unsteadiness on feet  Abnormal posture  Foot drop, left  Other abnormalities of gait and mobility     Problem List Patient Active Problem List   Diagnosis Date Noted  . NSVT (nonsustained ventricular tachycardia) ([place])   . Pancytopenia ([place])   . Acute blood loss anemia 07/18/2020  . Hemodialysis patient ([place]) 06/04/2020  . Necrotizing glomerulonephritis 06/04/2020  . Heme positive stool   . [place]   . Upper GI bleeding 05/23/2020  . Acute GI bleeding 05/23/2020  . Acute hematogenous osteomyelitis of right foot ([place]) 03/11/2020  . Gangrene of right foot ([place])   . Diabetic foot ulcer associated with type 2 diabetes  mellitus ([place]) 02/19/2020  . Heel ulcer ([place]) 11/05/2019  . Atherosclerotic PVD with ulceration ([place]) 09/09/2017  . Essential hypertension, benign 02/07/2015  . Renal insufficiency 02/07/2015  . Post PTCA 11/09/2014  . S/P PTCA (percutaneous transluminal coronary angioplasty) 11/09/2014  . Pacemaker: [place] DR MRI [id number] Dual chamber pacemaker 10/08/2014 10/08/2014  . Encounter for care of pacemaker 10/08/2014  . Sinus node dysfunction ([place]) 10/08/2014  . Cardiac asystole ([place]) 10/07/2014  . Cardiac syncope 10/07/2014  . Diabetes mellitus with stage 3 chronic kidney disease ([place]) 10/07/2014  . CAD (coronary artery disease), native coronary artery 10/07/2014  . Diabetes mellitus due to underlying condition without complications ([place]) 17/49/4496  . Anemia, unspecified 05/25/2014  . Syncope 04/30/2014  . Atherosclerosis of native arteries of the extremities with gangrene ([place]) 12/16/2013   . Volume overload 10/30/2013  . Acute renal failure ([place]) 10/28/2013  . PAD (peripheral artery disease) ([place]) 10/27/2013  . Leukocytosis 10/21/2013  . Anemia 10/21/2013  . Osteomyelitis of left foot ([place]) 08/19/2013  . Spinal stenosis, lumbar region, with neurogenic claudication 06/12/2012    Class: Diagnosis of  . DIABETES MELLITUS, TYPE II 03/15/2009  . ALCOHOL ABUSE 03/15/2009  . TOBACCO USE 03/15/2009  . HYPERTENSION 03/15/2009    Jamey Reas, PT, DPT 03/06/2021, 4:14 PM  [facility] Physical Therapy [address] [place], [place], [address] Phone: [phone]   Fax:  [phone]  Name: MAOR MECKEL MRN: [id number] Date of Birth: 26-Apr-1961

## 2021-03-07 DIAGNOSIS — N2581 Secondary hyperparathyroidism of renal origin: Secondary | ICD-10-CM | POA: Diagnosis not present

## 2021-03-07 DIAGNOSIS — D689 Coagulation defect, unspecified: Secondary | ICD-10-CM | POA: Diagnosis not present

## 2021-03-07 DIAGNOSIS — N186 End stage renal disease: Secondary | ICD-10-CM | POA: Diagnosis not present

## 2021-03-07 DIAGNOSIS — D509 Iron deficiency anemia, unspecified: Secondary | ICD-10-CM | POA: Diagnosis not present

## 2021-03-07 DIAGNOSIS — T8249XA Other complication of vascular dialysis catheter, initial encounter: Secondary | ICD-10-CM | POA: Diagnosis not present

## 2021-03-07 DIAGNOSIS — E0821 Diabetes mellitus due to underlying condition with diabetic nephropathy: Secondary | ICD-10-CM | POA: Diagnosis not present

## 2021-03-07 DIAGNOSIS — D631 Anemia in chronic kidney disease: Secondary | ICD-10-CM | POA: Diagnosis not present

## 2021-03-07 DIAGNOSIS — Z992 Dependence on renal dialysis: Secondary | ICD-10-CM | POA: Diagnosis not present

## 2021-03-08 ENCOUNTER — Other Ambulatory Visit: Payer: Self-pay

## 2021-03-08 ENCOUNTER — Ambulatory Visit (INDEPENDENT_AMBULATORY_CARE_PROVIDER_SITE_OTHER): Payer: Medicare Other | Admitting: Physical Therapy

## 2021-03-08 ENCOUNTER — Encounter: Payer: Self-pay | Admitting: Physical Therapy

## 2021-03-08 DIAGNOSIS — R2681 Unsteadiness on feet: Secondary | ICD-10-CM

## 2021-03-08 DIAGNOSIS — R293 Abnormal posture: Secondary | ICD-10-CM | POA: Diagnosis not present

## 2021-03-08 DIAGNOSIS — R2689 Other abnormalities of gait and mobility: Secondary | ICD-10-CM | POA: Diagnosis not present

## 2021-03-08 DIAGNOSIS — M21372 Foot drop, left foot: Secondary | ICD-10-CM | POA: Diagnosis not present

## 2021-03-08 NOTE — Therapy (Signed)
[facility] [address] [place], [place], [address] Phone: [phone]   Fax:  [phone]  Physical Therapy Treatment  Patient Details  Name: Alexander [name] MRN: [id number] Date of Birth: May 21, 1961 Referring Provider (PT): Meridee Score, MD   Encounter Date: 03/08/2021   PT End of Session - 03/08/21 0856    Visit Number 17    Number of Visits 25    Date for PT Re-Evaluation 04/04/21    Authorization Type BCBS Medicare    Authorization Time Period $40 co-pay    PT Start Time [phone]    PT Stop Time 0930    PT Time Calculation (min) 43 min    Equipment Utilized During Treatment Gait belt    Activity Tolerance Patient tolerated treatment well    Behavior During Therapy [facility] for tasks assessed/performed           Past Medical History:  Diagnosis Date  . Alcohol abuse   . Anemia   . Arthritis    "patient does not think so."  . Atrial fibrillation ([place])   . Cardiac syncope 10/07/14   rapid A fib with 8 sec pauses on converison with syncope- temp pacing wire placed then PPM  . Cataract    BILATERAL  . Chronic kidney disease   . Coronary artery disease   . Diabetes mellitus    dx---been  awhile  . Encounter for care of pacemaker 10/08/2014  . History of blood transfusion   . Hyperlipemia   . Hypertension   . Osteomyelitis ([place])    right foot  . Peripheral vascular disease ([place])   . Presence of permanent cardiac pacemaker 10/08/2014  . Sinus node dysfunction ([place]) 10/08/2014    Past Surgical History:  Procedure Laterality Date  . ABDOMINAL AORTOGRAM W/LOWER EXTREMITY N/A 09/09/2017   Procedure: ABDOMINAL AORTOGRAM W/LOWER EXTREMITY;  Surgeon: Angelia Mould, MD;  Location: [place] [facility];  Service: Cardiovascular;  Laterality: N/A;  . ABDOMINAL AORTOGRAM W/LOWER EXTREMITY Right 11/11/2019   Procedure: ABDOMINAL AORTOGRAM W/LOWER EXTREMITY;  Surgeon: Marty Heck, MD;  Location: [place] [facility];  Service:  Cardiovascular;  Laterality: Right;  . AMPUTATION Left 08/19/2013   Procedure: AMPUTATION RAY;  Surgeon: Alta Corning, MD;  Location: [place];  Service: Orthopedics;  Laterality: Left;  ray amputation left 5th  . AMPUTATION Left 10/27/2013   Procedure: AMPUTATION DIGIT-LEFT 4TH TOE, 4th and 5th metatarsal.;  Surgeon: Angelia Mould, MD;  Location: [place];  Service: Vascular;  Laterality: Left;  . AMPUTATION Left 11/04/2013   Procedure: LEFT FOOT TRANS-METATARSAL AMPUTATION WITH WOUND CLOSURE ;  Surgeon: Alta Corning, MD;  Location: [place];  Service: Orthopedics;  Laterality: Left;  . AMPUTATION Right 09/10/2017   Procedure: RIGHT FOURTH AND FIFTH TOE AMPUTATION;  Surgeon: Angelia Mould, MD;  Location: [place];  Service: Vascular;  Laterality: Right;  . AMPUTATION Right 02/19/2020   Procedure: AMPUTATION RIGHT 3RD TOE;  Surgeon: Angelia Mould, MD;  Location: [place];  Service: Vascular;  Laterality: Right;  . AMPUTATION Right 03/11/2020   Procedure: RIGHT BELOW KNEE AMPUTATION;  Surgeon: Newt Minion, MD;  Location: [place];  Service: Orthopedics;  Laterality: Right;  . APPLICATION OF WOUND VAC Right 02/19/2020   Procedure: Application Of Wound Vac Right foot;  Surgeon: Angelia Mould, MD;  Location: [place];  Service: Vascular;  Laterality: Right;  . BIOPSY  07/20/2020   Procedure: BIOPSY;  Surgeon: Rush Landmark Telford Nab., MD;  Location:  [place] [facility];  Service: Gastroenterology;;  . CARDIAC CATHETERIZATION  10/07/2014   Procedure: LEFT HEART CATH AND CORONARY ANGIOGRAPHY;  Surgeon: Laverda Page, MD;  Location: [facility];  Service: Cardiovascular;;  . COLONOSCOPY W/ POLYPECTOMY    . COLONOSCOPY WITH PROPOFOL N/A 05/25/2020   Procedure: COLONOSCOPY WITH PROPOFOL;  Surgeon: Gatha Mayer, MD;  Location: [facility];  Service: Endoscopy;  Laterality: N/A;  . EMBOLECTOMY Right 09/12/2017   Procedure: Thrombectomy  & Redo Right Below Knee Popliteal Artery Bypass Graft  ;   Surgeon: Waynetta Sandy, MD;  Location: [place];  Service: Vascular;  Laterality: Right;  . ENTEROSCOPY N/A 07/20/2020   Procedure: ENTEROSCOPY;  Surgeon: Mansouraty, Telford Nab., MD;  Location: [place];  Service: Gastroenterology;  Laterality: N/A;  . ESOPHAGOGASTRODUODENOSCOPY (EGD) WITH PROPOFOL N/A 05/24/2020   Procedure: ESOPHAGOGASTRODUODENOSCOPY (EGD) WITH PROPOFOL;  Surgeon: Ladene Artist, MD;  Location: [facility];  Service: Endoscopy;  Laterality: N/A;  . EYE SURGERY Bilateral    cataract  . FEMORAL-TIBIAL BYPASS GRAFT Left 10/27/2013   Procedure: BYPASS GRAFT LEFT FEMORAL- POSTERIOR TIBIAL ARTERY;  Surgeon: Angelia Mould, MD;  Location: [place];  Service: Vascular;  Laterality: Left;  . FEMORAL-TIBIAL BYPASS GRAFT Right 09/10/2017   Procedure: RIGHT SUPERFICIAL  FEMORAL ARTERY-BELOW KNEE POPLITEAL ARTERY BYPASS GRAFT WITH VEIN;  Surgeon: Angelia Mould, MD;  Location: [place];  Service: Vascular;  Laterality: Right;  . GIVENS CAPSULE STUDY N/A 07/20/2020   Procedure: GIVENS CAPSULE STUDY;  Surgeon: Irving Copas., MD;  Location: [place];  Service: Gastroenterology;  Laterality: N/A;  capsule placed through scope into duodenum @ 0936  . I & D EXTREMITY Left 10/27/2013   Procedure: IRRIGATION AND DEBRIDEMENT EXTREMITY- LEFT FOOT;  Surgeon: Angelia Mould, MD;  Location: [place];  Service: Vascular;  Laterality: Left;  . I & D EXTREMITY Right 01/26/2020   Procedure: IRRIGATION AND DEBRIDEMENT EXTREMITY RIGHT FOOT WOUND;  Surgeon: Angelia Mould, MD;  Location: [place];  Service: Vascular;  Laterality: Right;  . INTRAOPERATIVE ARTERIOGRAM Right 09/10/2017   Procedure: INTRA OPERATIVE ARTERIOGRAM;  Surgeon: Angelia Mould, MD;  Location: [place];  Service: Vascular;  Laterality: Right;  . IR ANGIOGRAM FOLLOW UP STUDY  09/09/2017  . IR FLUORO GUIDE CV LINE RIGHT  05/31/2020  . IR US GUIDE VASC ACCESS RIGHT  06/01/2020  . LEFT HEART CATH  AND CORONARY ANGIOGRAPHY N/A 08/19/2020   Procedure: LEFT HEART CATH AND CORONARY ANGIOGRAPHY;  Surgeon: Adrian Prows, MD;  Location: [place] [facility];  Service: Cardiovascular;  Laterality: N/A;  . LEFT HEART CATHETERIZATION WITH CORONARY ANGIOGRAM N/A 11/09/2014   Procedure: LEFT HEART CATHETERIZATION WITH CORONARY ANGIOGRAM;  Surgeon: Laverda Page, MD;  Location: [facility] CATH LAB;  Service: Cardiovascular;  Laterality: N/A;  . LOWER EXTREMITY ANGIOGRAM Right 11/08/2015   Procedure: Lower Extremity Angiogram;  Surgeon: Serafina Mitchell, MD;  Location: [place] [facility];  Service: Cardiovascular;  Laterality: Right;  . LUMBAR LAMINECTOMY  06/13/2012   Procedure: MICRODISCECTOMY LUMBAR LAMINECTOMY;  Surgeon: Jessy Oto, MD;  Location: [place];  Service: Orthopedics;  Laterality: N/A;  Central laminectomy L2-3, L3-4, L4-5  . PACEMAKER INSERTION  10/08/14   MDT [employer]a MRI compatible dual chamber pacemaker implanted by Dr Caryl Comes for syncope with post-termination pauses  . PERCUTANEOUS CORONARY STENT INTERVENTION (PCI-S)  11/09/2014   des to lad & distal circumflex         Dr  [name]  . PERIPHERAL VASCULAR  BALLOON ANGIOPLASTY Right 11/11/2019   Procedure: PERIPHERAL VASCULAR BALLOON ANGIOPLASTY;  Surgeon: Marty Heck, MD;  Location: [place] [facility];  Service: Cardiovascular;  Laterality: Right;  below knee popliteal, tibioperoneal trunk, posterior tibial arteries  . PERIPHERAL VASCULAR CATHETERIZATION N/A 11/08/2015   Procedure: Abdominal Aortogram;  Surgeon: Serafina Mitchell, MD;  Location: [place] [facility];  Service: Cardiovascular;  Laterality: N/A;  . PERIPHERAL VASCULAR CATHETERIZATION Right 11/08/2015   Procedure: Peripheral Vascular Atherectomy;  Surgeon: Serafina Mitchell, MD;  Location: [place] [facility];  Service: Cardiovascular;  Laterality: Right;  . PERIPHERAL VASCULAR CATHETERIZATION N/A 10/10/2016   Procedure: Lower Extremity Angiography;  Surgeon: Waynetta Sandy, MD;   Location: [place] [facility];  Service: Cardiovascular;  Laterality: N/A;  . PERIPHERAL VASCULAR CATHETERIZATION Right 10/10/2016   Procedure: Peripheral Vascular Atherectomy;  Surgeon: Waynetta Sandy, MD;  Location: [place] [facility];  Service: Cardiovascular;  Laterality: Right;  Popliteal  . PERMANENT PACEMAKER INSERTION N/A 10/08/2014   Procedure: PERMANENT PACEMAKER INSERTION;  Surgeon: Deboraha Sprang, MD;  Location: [facility] C[facility] LAB;  Service: Cardiovascular;  Laterality: N/A;  . SUBMUCOSAL TATTOO INJECTION  07/20/2020   Procedure: SUBMUCOSAL TATTOO INJECTION;  Surgeon: Irving Copas., MD;  Location: [place];  Service: Gastroenterology;;  . TEMPORARY PACEMAKER INSERTION Bilateral 10/07/2014   Procedure: TEMPORARY PACEMAKER INSERTION;  Surgeon: Laverda Page, MD;  Location: [facility] CATH LAB;  Service: Cardiovascular;  Laterality: Bilateral;  . WOUND DEBRIDEMENT Right 02/19/2020   Procedure: DEBRIDEMENT WOUND RIGHT FOOT;  Surgeon: Angelia Mould, MD;  Location: [place];  Service: Vascular;  Laterality: Right;    There were no vitals filed for this visit.   Subjective Assessment - 03/08/21 0847    Subjective He is slowly improving amount of walking.  His dtr & son-law are storing items at his home causing clutter.    Pertinent History Rt TTA, artthritis, A-Fib, cardiac syncope, cataract, CKD, CAD, DM, ESRD w/HD since 7/21, HTN, PVD, cardiac pacemaker,    Limitations Walking;House hold activities    Patient Stated Goals to use prosthesis to walk in community, to hunt & fish, yardwork & garden    Currently in Pain? No/denies                             [facility] Adult PT Treatment/Exercise - 03/08/21 0847      Transfers   Transfers Sit to Stand;Stand to Sit    Sit to Stand 5: Supervision;With upper extremity assist;With armrests;From chair/3-in-1;Other (comment)   to forearm crutches   Sit to Stand Details Verbal cues for safe use of DME/AE    Stand  to Sit With upper extremity assist;With armrests;To chair/3-in-1;Other (comment);5: Supervision   from forearm crutches   Stand to Sit Details (indicate cue type and reason) Verbal cues for safe use of DME/AE      Ambulation/Gait   Ambulation/Gait Yes    Ambulation/Gait Assistance 4: Min assist;5: Supervision   sup bil foreare crutches & minA single crutche   Ambulation/Gait Assistance Details verbal & tactile cues on upright posture and sequence    Ambulation Distance (Feet) 300 Feet   150' bil crutches & 50' single crutch   Assistive device Prosthesis;Rolling walker;L Forearm Crutch;Crutches    Ramp 4: Min assist   min guard bil forearm crutches & TTA prosthesis   Ramp Details (indicate cue type and reason) verbal & tactile cues on technique  Curb 4: Min assist   min guard bil forearm crutches & TTA prosthesis   Curb Details (indicate cue type and reason) verbal & tactile cues on technique      Exercises   Exercises Knee/Hip      Knee/Hip Exercises: Aerobic   Nustep seat 11 level 8 with BLEs & BUEs 6 min      Knee/Hip Exercises: Standing   Rocker Board 1 minute   round board w/one pivot point, ant/level/post & rt/level/lt   Rocker Board Limitations mirror for visual upright, verbal cues on control, BUE support on //bars    Other Standing Knee Exercises stance limb on foam stepping contralateral LE forward & back over foam with left crutch support & intermittent RUE support //bar 5 reps ea LE.      Knee/Hip Exercises: Seated   Sit to Sand 1 set;10 reps;without UE support   from 24" bar stool, verbal cues on technique     Prosthetics   Current prosthetic wear tolerance (days/week)  daily    Current prosthetic wear tolerance (#hours/day)  nondialysis days & after dialysis all awake hours except 2 hrs midday    Current prosthetic weight-bearing tolerance (hours/day)  Patient tolerated standing & gait with partial weight on prosthesis for 5 minutes with no c/o limb pain or discomfort.     Edema Pitting edema with 5-7 sec capillary refill    Residual limb condition  No open areas, normal color, tempertaure & moisture, shape cylinderical                    PT Short Term Goals - 03/06/21 1609      PT SHORT TERM GOAL #1   Title Patient verbalizes proper weighing & prosthetic issues with dialysis.    Time 1    Period Months    Status Achieved    Target Date 03/03/21      PT SHORT TERM GOAL #2   Title Pateint tolerates prosthesis wear >90% of awake hours / day with no skin issues.    Time 1    Period Months    Status Achieved    Target Date 03/03/21      PT SHORT TERM GOAL #3   Title Pateint is able to reach 10" anteriorly & pick up item from floor with RW safely.    Time 1    Period Months    Status Achieved    Target Date 03/03/21      PT SHORT TERM GOAL #4   Title Pateint ambulates 300' with RW & prosthesis modified independent.    Time 1    Period Months    Status Achieved    Target Date 03/03/21      PT SHORT TERM GOAL #5   Title Patient negotiates ramps & curbs with RW & prosthesis with supervision    Time 1    Period Months    Status Achieved    Target Date 03/03/21      PT SHORT TERM GOAL #6   Title Patient ambulates 58' with cane & prosthesis with minA    Time 4    Period Weeks    Status Achieved    Target Date 03/03/21             PT Long Term Goals - 01/18/21 0941      PT LONG TERM GOAL #1   Title Patient verbalizes & demonstrates understanding of prosthetic care to enable safe utilization of prosthesis.  Time 3    Period Months    Status On-going    Target Date 04/03/21      PT LONG TERM GOAL #2   Title Patient tolerates wear of prosthesis >90% of awake hours without skin or limb pain issues to enable function throughout his day.    Time 3    Period Months    Status On-going    Target Date 04/03/21      PT LONG TERM GOAL #3   Title Task of [place] Balance with RW  >/= 45/56    Time 3    Period Months    Status  On-going    Target Date 04/03/21      PT LONG TERM GOAL #4   Title Patient ambulates >400' with LRAD & prosthesis modified independent.    Time 3    Period Months    Status On-going    Target Date 04/03/21      PT LONG TERM GOAL #5   Title Patient negotiates ramps, curbs & stairs with LRAD & prosthesis modified independent.    Time 3    Period Months    Status On-going    Target Date 04/03/21                 Plan - 03/08/21 0857    Clinical Impression Statement Patient is slowly progressing his function with prosthesis but is limited by muscle fatigue.  PT continues to discuss increasing frequency of short & medium distance walks & 1x/day on nondialysis days walking to max tolerable distance with RW and not sitting in w/c which he verbalizes understanding but limited carryover.    Personal Factors and Comorbidities Comorbidity 3+;Fitness;Time since onset of injury/illness/exacerbation;Past/Current Experience    Comorbidities Rt TTA, left Transmetatarsal Amputation, artthritis, A-Fib, cardiac syncope, cataract, CKD, CAD, DM, ESRD w/HD since 7/21, HTN, PVD, cardiac pacemaker,    Examination-Activity Limitations Locomotion Level;Squat;Stairs;Stand;Toileting;Transfers    Examination-Participation Restrictions Community Activity    Stability/Clinical Decision Making Evolving/Moderate complexity    Rehab Potential Good    PT Frequency 2x / week    PT Duration 12 weeks    PT Treatment/Interventions ADLs/Self Care Home Management;DME Instruction;Gait training;Stair training;Functional mobility training;Therapeutic activities;Therapeutic exercise;Balance training;Neuromuscular re-education;Patient/family education;Prosthetic Training;Vestibular    PT Next Visit Plan review prosthetic care, prosthetic gait household level with cane & community level with forearm crutches including ramps & curbs. standing balance & strength    Consulted and Agree with Plan of Care Patient            Patient will benefit from skilled therapeutic intervention in order to improve the following deficits and impairments:  Abnormal gait,Cardiopulmonary status limiting activity,Decreased activity tolerance,Decreased balance,Decreased endurance,Decreased knowledge of use of DME,Decreased mobility,Decreased scar mobility,Decreased strength,Increased edema,Impaired flexibility,Postural dysfunction,Prosthetic Dependency,Obesity  Visit Diagnosis: Unsteadiness on feet  Abnormal posture  Foot drop, left  Other abnormalities of gait and mobility     Problem List Patient Active Problem List   Diagnosis Date Noted  . NSVT (nonsustained ventricular tachycardia) ([place])   . Pancytopenia ([place])   . Acute blood loss anemia 07/18/2020  . Hemodialysis patient ([place]) 06/04/2020  . Necrotizing glomerulonephritis 06/04/2020  . Heme positive stool   . Melena   . Upper GI bleeding 05/23/2020  . Acute GI bleeding 05/23/2020  . Acute hematogenous osteomyelitis of right foot ([place]) 03/11/2020  . Gangrene of right foot ([place])   . Diabetic foot ulcer associated with type 2 diabetes mellitus ([place]) 02/19/2020  . Heel ulcer ([place])  11/05/2019  . Atherosclerotic PVD with ulceration ([place]) 09/09/2017  . Essential hypertension, benign 02/07/2015  . Renal insufficiency 02/07/2015  . Post PTCA 11/09/2014  . S/P PTCA (percutaneous transluminal coronary angioplasty) 11/09/2014  . Pacemaker: [place] DR MRI [id number] Dual chamber pacemaker 10/08/2014 10/08/2014  . Encounter for care of pacemaker 10/08/2014  . Sinus node dysfunction ([place]) 10/08/2014  . Cardiac asystole ([place]) 10/07/2014  . Cardiac syncope 10/07/2014  . Diabetes mellitus with stage 3 chronic kidney disease ([place]) 10/07/2014  . CAD (coronary artery disease), native coronary artery 10/07/2014  . Diabetes mellitus due to underlying condition without complications ([place]) 63/84/5364  . Anemia, unspecified 05/25/2014  . Syncope 04/30/2014  .  Atherosclerosis of native arteries of the extremities with gangrene ([place]) 12/16/2013  . Volume overload 10/30/2013  . Acute renal failure ([place]) 10/28/2013  . PAD (peripheral artery disease) ([place]) 10/27/2013  . Leukocytosis 10/21/2013  . Anemia 10/21/2013  . Osteomyelitis of left foot ([place]) 08/19/2013  . Spinal stenosis, lumbar region, with neurogenic claudication 06/12/2012    Class: Diagnosis of  . DIABETES MELLITUS, TYPE II 03/15/2009  . ALCOHOL ABUSE 03/15/2009  . TOBACCO USE 03/15/2009  . HYPERTENSION 03/15/2009    Jamey Reas, PT, DPT 03/08/2021, [facility] [address] [place], [place], [address] Phone: [phone]   Fax:  [phone]  Name: Alexander [name] MRN: [id number] Date of Birth: February 15, 1961

## 2021-03-09 DIAGNOSIS — N2581 Secondary hyperparathyroidism of renal origin: Secondary | ICD-10-CM | POA: Diagnosis not present

## 2021-03-09 DIAGNOSIS — E0821 Diabetes mellitus due to underlying condition with diabetic nephropathy: Secondary | ICD-10-CM | POA: Diagnosis not present

## 2021-03-09 DIAGNOSIS — D631 Anemia in chronic kidney disease: Secondary | ICD-10-CM | POA: Diagnosis not present

## 2021-03-09 DIAGNOSIS — D689 Coagulation defect, unspecified: Secondary | ICD-10-CM | POA: Diagnosis not present

## 2021-03-09 DIAGNOSIS — Z992 Dependence on renal dialysis: Secondary | ICD-10-CM | POA: Diagnosis not present

## 2021-03-09 DIAGNOSIS — T8249XA Other complication of vascular dialysis catheter, initial encounter: Secondary | ICD-10-CM | POA: Diagnosis not present

## 2021-03-09 DIAGNOSIS — D509 Iron deficiency anemia, unspecified: Secondary | ICD-10-CM | POA: Diagnosis not present

## 2021-03-09 DIAGNOSIS — N186 End stage renal disease: Secondary | ICD-10-CM | POA: Diagnosis not present

## 2021-03-11 DIAGNOSIS — D631 Anemia in chronic kidney disease: Secondary | ICD-10-CM | POA: Diagnosis not present

## 2021-03-11 DIAGNOSIS — N2581 Secondary hyperparathyroidism of renal origin: Secondary | ICD-10-CM | POA: Diagnosis not present

## 2021-03-11 DIAGNOSIS — E0821 Diabetes mellitus due to underlying condition with diabetic nephropathy: Secondary | ICD-10-CM | POA: Diagnosis not present

## 2021-03-11 DIAGNOSIS — N179 Acute kidney failure, unspecified: Secondary | ICD-10-CM | POA: Diagnosis not present

## 2021-03-11 DIAGNOSIS — T8249XA Other complication of vascular dialysis catheter, initial encounter: Secondary | ICD-10-CM | POA: Diagnosis not present

## 2021-03-11 DIAGNOSIS — D689 Coagulation defect, unspecified: Secondary | ICD-10-CM | POA: Diagnosis not present

## 2021-03-11 DIAGNOSIS — N186 End stage renal disease: Secondary | ICD-10-CM | POA: Diagnosis not present

## 2021-03-11 DIAGNOSIS — D509 Iron deficiency anemia, unspecified: Secondary | ICD-10-CM | POA: Diagnosis not present

## 2021-03-11 DIAGNOSIS — Z992 Dependence on renal dialysis: Secondary | ICD-10-CM | POA: Diagnosis not present

## 2021-03-13 ENCOUNTER — Other Ambulatory Visit: Payer: Self-pay

## 2021-03-13 ENCOUNTER — Ambulatory Visit (INDEPENDENT_AMBULATORY_CARE_PROVIDER_SITE_OTHER): Payer: Medicare Other | Admitting: Physical Therapy

## 2021-03-13 DIAGNOSIS — R2681 Unsteadiness on feet: Secondary | ICD-10-CM

## 2021-03-13 DIAGNOSIS — R2689 Other abnormalities of gait and mobility: Secondary | ICD-10-CM | POA: Diagnosis not present

## 2021-03-13 DIAGNOSIS — R293 Abnormal posture: Secondary | ICD-10-CM

## 2021-03-13 DIAGNOSIS — M21372 Foot drop, left foot: Secondary | ICD-10-CM | POA: Diagnosis not present

## 2021-03-13 NOTE — Therapy (Signed)
[facility] Physical Therapy [address] [place], [place], [address] Phone: [phone]   Fax:  [phone]  Physical Therapy Treatment  Patient Details  Name: Alexander [name] MRN: [id number] Date of Birth: [birth year] Referring Provider (PT): Meridee Score, MD   Encounter Date: 03/13/2021   PT End of Session - 03/13/21 0859    Visit Number 18    Number of Visits 25    Date for PT Re-Evaluation 04/04/21    Authorization Type BCBS Medicare    Authorization Time Period $40 co-pay    PT Start Time [phone]    PT Stop Time 0930    PT Time Calculation (min) 41 min    Equipment Utilized During Treatment Gait belt    Activity Tolerance Patient tolerated treatment well    Behavior During Therapy [facility] for tasks assessed/performed           Past Medical History:  Diagnosis Date  . Alcohol abuse   . Anemia   . Arthritis    "patient does not think so."  . Atrial fibrillation ([place])   . Cardiac syncope 10/07/14   rapid A fib with 8 sec pauses on converison with syncope- temp pacing wire placed then PPM  . Cataract    BILATERAL  . Chronic kidney disease   . Coronary artery disease   . Diabetes mellitus    dx---been  awhile  . Encounter for care of pacemaker 10/08/2014  . History of blood transfusion   . Hyperlipemia   . Hypertension   . Osteomyelitis ([place])    right foot  . Peripheral vascular disease ([place])   . Presence of permanent cardiac pacemaker 10/08/2014  . Sinus node dysfunction ([place]) 10/08/2014    Past Surgical History:  Procedure Laterality Date  . ABDOMINAL AORTOGRAM W/LOWER EXTREMITY N/A 09/09/2017   Procedure: ABDOMINAL AORTOGRAM W/LOWER EXTREMITY;  Surgeon: Angelia Mould, MD;  Location: [place] CV [facility];  Service: Cardiovascular;  Laterality: N/A;  . ABDOMINAL AORTOGRAM W/LOWER EXTREMITY Right 11/11/2019   Procedure: ABDOMINAL AORTOGRAM W/LOWER EXTREMITY;  Surgeon: Marty Heck, MD;  Location: [place] [facility];  Service:  Cardiovascular;  Laterality: Right;  . AMPUTATION Left 08/19/2013   Procedure: AMPUTATION RAY;  Surgeon: Alta Corning, MD;  Location: [place];  Service: Orthopedics;  Laterality: Left;  ray amputation left 5th  . AMPUTATION Left 10/27/2013   Procedure: AMPUTATION DIGIT-LEFT 4TH TOE, 4th and 5th metatarsal.;  Surgeon: Angelia Mould, MD;  Location: [place];  Service: Vascular;  Laterality: Left;  . AMPUTATION Left 11/04/2013   Procedure: LEFT FOOT TRANS-METATARSAL AMPUTATION WITH WOUND CLOSURE ;  Surgeon: Alta Corning, MD;  Location: [place];  Service: Orthopedics;  Laterality: Left;  . AMPUTATION Right 09/10/2017   Procedure: RIGHT FOURTH AND FIFTH TOE AMPUTATION;  Surgeon: Angelia Mould, MD;  Location: [place];  Service: Vascular;  Laterality: Right;  . AMPUTATION Right 02/19/2020   Procedure: AMPUTATION RIGHT 3RD TOE;  Surgeon: Angelia Mould, MD;  Location: [place];  Service: Vascular;  Laterality: Right;  . AMPUTATION Right 03/11/2020   Procedure: RIGHT BELOW KNEE AMPUTATION;  Surgeon: Newt Minion, MD;  Location: [place];  Service: Orthopedics;  Laterality: Right;  . APPLICATION OF WOUND VAC Right 02/19/2020   Procedure: Application Of Wound Vac Right foot;  Surgeon: Angelia Mould, MD;  Location: [place];  Service: Vascular;  Laterality: Right;  . BIOPSY  07/20/2020   Procedure: BIOPSY;  Surgeon: Rush Landmark Telford Nab., MD;  Location:  [place] ENDOS[facility];  Service: Gastroenterology;;  . CARDIAC CATHETERIZATION  10/07/2014   Procedure: LEFT HEART CATH AND CORONARY ANGIOGRAPHY;  Surgeon: Laverda Page, MD;  Location: [facility];  Service: Cardiovascular;;  . COLONOSCOPY W/ POLYPECTOMY    . COLONOSCOPY WITH PROPOFOL N/A 05/25/2020   Procedure: COLONOSCOPY WITH PROPOFOL;  Surgeon: Gatha Mayer, MD;  Location: [facility];  Service: Endoscopy;  Laterality: N/A;  . EMBOLECTOMY Right 09/12/2017   Procedure: Thrombectomy  & Redo Right Below Knee Popliteal Artery Bypass Graft  ;   Surgeon: Waynetta Sandy, MD;  Location: [place];  Service: Vascular;  Laterality: Right;  . ENTEROSCOPY N/A 07/20/2020   Procedure: ENTEROSCOPY;  Surgeon: Mansouraty, Telford Nab., MD;  Location: [place];  Service: Gastroenterology;  Laterality: N/A;  . ESOPHAGOGASTRODUODENOSCOPY (EGD) WITH PROPOFOL N/A 05/24/2020   Procedure: ESOPHAGOGASTRODUODENOSCOPY (EGD) WITH PROPOFOL;  Surgeon: Ladene Artist, MD;  Location: [facility];  Service: Endoscopy;  Laterality: N/A;  . EYE SURGERY Bilateral    cataract  . FEMORAL-TIBIAL BYPASS GRAFT Left 10/27/2013   Procedure: BYPASS GRAFT LEFT FEMORAL- POSTERIOR TIBIAL ARTERY;  Surgeon: Angelia Mould, MD;  Location: [place];  Service: Vascular;  Laterality: Left;  . FEMORAL-TIBIAL BYPASS GRAFT Right 09/10/2017   Procedure: RIGHT SUPERFICIAL  FEMORAL ARTERY-BELOW KNEE POPLITEAL ARTERY BYPASS GRAFT WITH VEIN;  Surgeon: Angelia Mould, MD;  Location: [place];  Service: Vascular;  Laterality: Right;  . GIVENS CAPSULE STUDY N/A 07/20/2020   Procedure: GIVENS CAPSULE STUDY;  Surgeon: Irving Copas., MD;  Location: [place];  Service: Gastroenterology;  Laterality: N/A;  capsule placed through scope into duodenum @ 0936  . I & D EXTREMITY Left 10/27/2013   Procedure: IRRIGATION AND DEBRIDEMENT EXTREMITY- LEFT FOOT;  Surgeon: Angelia Mould, MD;  Location: [place];  Service: Vascular;  Laterality: Left;  . I & D EXTREMITY Right 01/26/2020   Procedure: IRRIGATION AND DEBRIDEMENT EXTREMITY RIGHT FOOT WOUND;  Surgeon: Angelia Mould, MD;  Location: [place];  Service: Vascular;  Laterality: Right;  . INTRAOPERATIVE ARTERIOGRAM Right 09/10/2017   Procedure: INTRA OPERATIVE ARTERIOGRAM;  Surgeon: Angelia Mould, MD;  Location: [place];  Service: Vascular;  Laterality: Right;  . IR ANGIOGRAM FOLLOW UP STUDY  09/09/2017  . IR FLUORO GUIDE CV LINE RIGHT  05/31/2020  . IR US GUIDE VASC ACCESS RIGHT  06/01/2020  . LEFT HEART CATH  AND CORONARY ANGIOGRAPHY N/A 08/19/2020   Procedure: LEFT HEART CATH AND CORONARY ANGIOGRAPHY;  Surgeon: Adrian Prows, MD;  Location: [place] CV LAB;  Service: Cardiovascular;  Laterality: N/A;  . LEFT HEART CATHETERIZATION WITH CORONARY ANGIOGRAM N/A 11/09/2014   Procedure: LEFT HEART CATHETERIZATION WITH CORONARY ANGIOGRAM;  Surgeon: Laverda Page, MD;  Location: [facility] CATH LAB;  Service: Cardiovascular;  Laterality: N/A;  . LOWER EXTREMITY ANGIOGRAM Right 11/08/2015   Procedure: Lower Extremity Angiogram;  Surgeon: Serafina Mitchell, MD;  Location: [place] CV LAB;  Service: Cardiovascular;  Laterality: Right;  . LUMBAR LAMINECTOMY  06/13/2012   Procedure: MICRODISCECTOMY LUMBAR LAMINECTOMY;  Surgeon: Jessy Oto, MD;  Location: [place];  Service: Orthopedics;  Laterality: N/A;  Central laminectomy L2-3, L3-4, L4-5  . PACEMAKER INSERTION  10/08/14   MDT Advisa MRI compatible dual chamber pacemaker implanted by Dr Caryl Comes for syncope with post-termination pauses  . PERCUTANEOUS CORONARY STENT INTERVENTION (PCI-S)  11/09/2014   des to lad & distal circumflex         Dr  [name]  . PERIPHERAL VASCULAR  BALLOON ANGIOPLASTY Right 11/11/2019   Procedure: PERIPHERAL VASCULAR BALLOON ANGIOPLASTY;  Surgeon: Marty Heck, MD;  Location: [place] CV [facility];  Service: Cardiovascular;  Laterality: Right;  below knee popliteal, tibioperoneal trunk, posterior tibial arteries  . PERIPHERAL VASCULAR CATHETERIZATION N/A 11/08/2015   Procedure: Abdominal Aortogram;  Surgeon: Serafina Mitchell, MD;  Location: [place] [facility];  Service: Cardiovascular;  Laterality: N/A;  . PERIPHERAL VASCULAR CATHETERIZATION Right 11/08/2015   Procedure: Peripheral Vascular Atherectomy;  Surgeon: Serafina Mitchell, MD;  Location: [place] [facility];  Service: Cardiovascular;  Laterality: Right;  . PERIPHERAL VASCULAR CATHETERIZATION N/A 10/10/2016   Procedure: Lower Extremity Angiography;  Surgeon: Waynetta Sandy, MD;   Location: [place] CV LAB;  Service: Cardiovascular;  Laterality: N/A;  . PERIPHERAL VASCULAR CATHETERIZATION Right 10/10/2016   Procedure: Peripheral Vascular Atherectomy;  Surgeon: Waynetta Sandy, MD;  Location: [place] [facility];  Service: Cardiovascular;  Laterality: Right;  Popliteal  . PERMANENT PACEMAKER INSERTION N/A 10/08/2014   Procedure: PERMANENT PACEMAKER INSERTION;  Surgeon: Deboraha Sprang, MD;  Location: [facility] CATH LAB;  Service: Cardiovascular;  Laterality: N/A;  . SUBMUCOSAL TATTOO INJECTION  07/20/2020   Procedure: SUBMUCOSAL TATTOO INJECTION;  Surgeon: Irving Copas., MD;  Location: [place];  Service: Gastroenterology;;  . TEMPORARY PACEMAKER INSERTION Bilateral 10/07/2014   Procedure: TEMPORARY PACEMAKER INSERTION;  Surgeon: Laverda Page, MD;  Location: [facility] CATH LAB;  Service: Cardiovascular;  Laterality: Bilateral;  . WOUND DEBRIDEMENT Right 02/19/2020   Procedure: DEBRIDEMENT WOUND RIGHT FOOT;  Surgeon: Angelia Mould, MD;  Location: [place];  Service: Vascular;  Laterality: Right;    There were no vitals filed for this visit.   Subjective Assessment - 03/13/21 0859    Subjective denies any pain or issues with prosthesis today.    Pertinent History Rt TTA, artthritis, A-Fib, cardiac syncope, cataract, CKD, CAD, DM, ESRD w/HD since 7/21, HTN, PVD, cardiac pacemaker,    Limitations Walking;House hold activities    Patient Stated Goals to use prosthesis to walk in community, to hunt & fish, yardwork & garden            [facility] Adult PT Treatment/Exercise - 03/13/21 0001      Transfers   Transfers Sit to Stand;Stand to Sit    Sit to Stand 5: Supervision;With upper extremity assist;With armrests;From chair/3-in-1;Other (comment)   to forearm crutches   Sit to Stand Details Verbal cues for safe use of DME/AE    Stand to Sit With upper extremity assist;With armrests;To chair/3-in-1;Other (comment);5: Supervision   from forearm crutches    Stand to Sit Details (indicate cue type and reason) Verbal cues for safe use of DME/AE      Ambulation/Gait   Ambulation/Gait Yes    Ambulation/Gait Assistance 4: Min assist;5: Supervision   sup bil foreare crutches & minA single crutche   Ambulation Distance (Feet) 300 Feet   150' bil crutches & 50' single crutch   Assistive device Prosthesis;Rolling walker;L Forearm Crutch;Crutches    Ramp 4: Min assist   min guard bil forearm crutches & TTA prosthesis   Curb 4: Min assist   min guard bil forearm crutches & TTA prosthesis     Exercises   Exercises Knee/Hip      Knee/Hip Exercises: Aerobic   Nustep seat 11 level 8 with BLEs & BUEs 6 min      Knee/Hip Exercises: Machines for Strengthening   Cybex Leg Press shuttle leg press BLEs 137# 15 reps 2  sets 1st set back 45* & 2nd set back flat      Knee/Hip Exercises: Standing   Rocker Board 1 minute   round board w/one pivot point, ant/level/post & rt/level/lt   Rocker Board Limitations mirror for visual upright, verbal cues on control, BUE support on //bars    Other Standing Knee Exercises stance limb on foam stepping contralateral LE forward & back over foam with left crutch support & intermittent RUE support //bar 5 reps ea LE.      Knee/Hip Exercises: Seated   Sit to Sand 1 set;10 reps;without UE support   from 24" bar stool, verbal cues on technique     Prosthetics   Current prosthetic wear tolerance (days/week)  daily    Current prosthetic wear tolerance (#hours/day)  nondialysis days & after dialysis all awake hours except 2 hrs midday    Current prosthetic weight-bearing tolerance (hours/day)  Patient tolerated standing & gait with partial weight on prosthesis for 5 minutes with no c/o limb pain or discomfort.    Edema --    Residual limb condition  --                    PT Short Term Goals - 03/06/21 1609      PT SHORT TERM GOAL #1   Title Patient verbalizes proper weighing & prosthetic issues with dialysis.    Time  1    Period Months    Status Achieved    Target Date 03/03/21      PT SHORT TERM GOAL #2   Title Pateint tolerates prosthesis wear >90% of awake hours / day with no skin issues.    Time 1    Period Months    Status Achieved    Target Date 03/03/21      PT SHORT TERM GOAL #3   Title Pateint is able to reach 10" anteriorly & pick up item from floor with RW safely.    Time 1    Period Months    Status Achieved    Target Date 03/03/21      PT SHORT TERM GOAL #4   Title Pateint ambulates 300' with RW & prosthesis modified independent.    Time 1    Period Months    Status Achieved    Target Date 03/03/21      PT SHORT TERM GOAL #5   Title Patient negotiates ramps & curbs with RW & prosthesis with supervision    Time 1    Period Months    Status Achieved    Target Date 03/03/21      PT SHORT TERM GOAL #6   Title Patient ambulates 57' with cane & prosthesis with minA    Time 4    Period Weeks    Status Achieved    Target Date 03/03/21             PT Long Term Goals - 01/18/21 0941      PT LONG TERM GOAL #1   Title Patient verbalizes & demonstrates understanding of prosthetic care to enable safe utilization of prosthesis.    Time 3    Period Months    Status On-going    Target Date 04/03/21      PT LONG TERM GOAL #2   Title Patient tolerates wear of prosthesis >90% of awake hours without skin or limb pain issues to enable function throughout his day.    Time 3    Period Months  Status On-going    Target Date 04/03/21      PT LONG TERM GOAL #3   Title Task of [place] Balance with RW  >/= 45/56    Time 3    Period Months    Status On-going    Target Date 04/03/21      PT LONG TERM GOAL #4   Title Patient ambulates >400' with LRAD & prosthesis modified independent.    Time 3    Period Months    Status On-going    Target Date 04/03/21      PT LONG TERM GOAL #5   Title Patient negotiates ramps, curbs & stairs with LRAD & prosthesis modified independent.     Time 3    Period Months    Status On-going    Target Date 04/03/21                 Plan - 03/13/21 6759    Clinical Impression Statement he shows good tolerance to exercises and activiites but is still limited by fatigue and needs rest breaks. We will continue to work to progress this as able.    Personal Factors and Comorbidities Comorbidity 3+;Fitness;Time since onset of injury/illness/exacerbation;Past/Current Experience    Comorbidities Rt TTA, left Transmetatarsal Amputation, artthritis, A-Fib, cardiac syncope, cataract, CKD, CAD, DM, ESRD w/HD since 7/21, HTN, PVD, cardiac pacemaker,    Examination-Activity Limitations Locomotion Level;Squat;Stairs;Stand;Toileting;Transfers    Examination-Participation Restrictions Community Activity    Stability/Clinical Decision Making Evolving/Moderate complexity    Rehab Potential Good    PT Frequency 2x / week    PT Duration 12 weeks    PT Treatment/Interventions ADLs/Self Care Home Management;DME Instruction;Gait training;Stair training;Functional mobility training;Therapeutic activities;Therapeutic exercise;Balance training;Neuromuscular re-education;Patient/family education;Prosthetic Training;Vestibular    PT Next Visit Plan review prosthetic care, prosthetic gait household level with cane & community level with forearm crutches including ramps & curbs. standing balance & strength    Consulted and Agree with Plan of Care Patient           Patient will benefit from skilled therapeutic intervention in order to improve the following deficits and impairments:  Abnormal gait,Cardiopulmonary status limiting activity,Decreased activity tolerance,Decreased balance,Decreased endurance,Decreased knowledge of use of DME,Decreased mobility,Decreased scar mobility,Decreased strength,Increased edema,Impaired flexibility,Postural dysfunction,Prosthetic Dependency,Obesity  Visit Diagnosis: Unsteadiness on feet  Abnormal posture  Foot drop,  left  Other abnormalities of gait and mobility     Problem List Patient Active Problem List   Diagnosis Date Noted  . NSVT (nonsustained ventricular tachycardia) ([place])   . Pancytopenia ([place])   . Acute blood loss anemia 07/18/2020  . Hemodialysis patient ([place]) 06/04/2020  . Necrotizing glomerulonephritis 06/04/2020  . Heme positive stool   . Melena   . Upper GI bleeding 05/23/2020  . Acute GI bleeding 05/23/2020  . Acute hematogenous osteomyelitis of right foot ([place]) 03/11/2020  . Gangrene of right foot ([place])   . Diabetic foot ulcer associated with type 2 diabetes mellitus ([place]) 02/19/2020  . Heel ulcer ([place]) 11/05/2019  . Atherosclerotic PVD with ulceration ([place]) 09/09/2017  . Essential hypertension, benign 02/07/2015  . Renal insufficiency 02/07/2015  . Post PTCA 11/09/2014  . S/P PTCA (percutaneous transluminal coronary angioplasty) 11/09/2014  . Pacemaker: [place] DR MRI [id number] Dual chamber pacemaker 10/08/2014 10/08/2014  . Encounter for care of pacemaker 10/08/2014  . Sinus node dysfunction ([place]) 10/08/2014  . Cardiac asystole ([place]) 10/07/2014  . Cardiac syncope 10/07/2014  . Diabetes mellitus with stage 3 chronic kidney disease ([place]) 10/07/2014  . CAD (  coronary artery disease), native coronary artery 10/07/2014  . Diabetes mellitus due to underlying condition without complications ([place]) 83/33/8329  . Anemia, unspecified 05/25/2014  . Syncope 04/30/2014  . Atherosclerosis of native arteries of the extremities with gangrene ([place]) 12/16/2013  . Volume overload 10/30/2013  . Acute renal failure ([place]) 10/28/2013  . PAD (peripheral artery disease) ([place]) 10/27/2013  . Leukocytosis 10/21/2013  . Anemia 10/21/2013  . Osteomyelitis of left foot ([place]) 08/19/2013  . Spinal stenosis, lumbar region, with neurogenic claudication 06/12/2012    Class: Diagnosis of  . DIABETES MELLITUS, TYPE II 03/15/2009  . ALCOHOL ABUSE 03/15/2009  . TOBACCO USE 03/15/2009  .  HYPERTENSION 03/15/2009    Alexander [name] 03/13/2021, 9:44 AM  [facility] Physical Therapy [address] [place], [place], [address] Phone: [phone]   Fax:  [phone]  Name: Alexander [name] MRN: [id number] Date of Birth: December 13, 1960

## 2021-03-14 DIAGNOSIS — E0821 Diabetes mellitus due to underlying condition with diabetic nephropathy: Secondary | ICD-10-CM | POA: Diagnosis not present

## 2021-03-14 DIAGNOSIS — Z992 Dependence on renal dialysis: Secondary | ICD-10-CM | POA: Diagnosis not present

## 2021-03-14 DIAGNOSIS — N2581 Secondary hyperparathyroidism of renal origin: Secondary | ICD-10-CM | POA: Diagnosis not present

## 2021-03-14 DIAGNOSIS — D631 Anemia in chronic kidney disease: Secondary | ICD-10-CM | POA: Diagnosis not present

## 2021-03-14 DIAGNOSIS — T8249XA Other complication of vascular dialysis catheter, initial encounter: Secondary | ICD-10-CM | POA: Diagnosis not present

## 2021-03-14 DIAGNOSIS — D689 Coagulation defect, unspecified: Secondary | ICD-10-CM | POA: Diagnosis not present

## 2021-03-14 DIAGNOSIS — N186 End stage renal disease: Secondary | ICD-10-CM | POA: Diagnosis not present

## 2021-03-15 ENCOUNTER — Ambulatory Visit (INDEPENDENT_AMBULATORY_CARE_PROVIDER_SITE_OTHER)
Admission: RE | Admit: 2021-03-15 | Discharge: 2021-03-15 | Disposition: A | Discharge status: Home / Self Care | Payer: Medicare Other | Source: Ambulatory Visit | Attending: Vascular Surgery | Admitting: Vascular Surgery

## 2021-03-15 ENCOUNTER — Encounter: Payer: Self-pay | Admitting: Vascular Surgery

## 2021-03-15 ENCOUNTER — Ambulatory Visit (INDEPENDENT_AMBULATORY_CARE_PROVIDER_SITE_OTHER): Payer: Medicare Other | Admitting: Vascular Surgery

## 2021-03-15 ENCOUNTER — Encounter: Payer: Medicare Other | Admitting: Physical Therapy

## 2021-03-15 ENCOUNTER — Other Ambulatory Visit: Payer: Self-pay

## 2021-03-15 ENCOUNTER — Ambulatory Visit (HOSPITAL_COMMUNITY)
Admission: RE | Admit: 2021-03-15 | Discharge: 2021-03-15 | Disposition: A | Discharge status: Home / Self Care | Payer: Medicare Other | Source: Ambulatory Visit | Attending: Vascular Surgery | Admitting: Vascular Surgery

## 2021-03-15 ENCOUNTER — Other Ambulatory Visit: Payer: Self-pay | Admitting: *Deleted

## 2021-03-15 VITALS — BP 149/70 | HR 65 | Temp 98.4°F | Resp 20 | Ht 72.0 in | Wt 270.0 lb

## 2021-03-15 DIAGNOSIS — I70269 Atherosclerosis of native arteries of extremities with gangrene, unspecified extremity: Secondary | ICD-10-CM | POA: Diagnosis not present

## 2021-03-15 DIAGNOSIS — Z48812 Encounter for surgical aftercare following surgery on the circulatory system: Secondary | ICD-10-CM

## 2021-03-15 DIAGNOSIS — I739 Peripheral vascular disease, unspecified: Secondary | ICD-10-CM | POA: Insufficient documentation

## 2021-03-15 NOTE — Progress Notes (Signed)
REASON FOR VISIT:   Follow-up of peripheral vascular disease and to evaluate for new hemodialysis access  MEDICAL ISSUES:   PERIPHERAL VASCULAR DISEASE: His right below the knee amputation site is healed nicely and he now has a prosthesis and is beginning to ambulate with this.  His left femoral to posterior tibial artery bypass which was done in 2014 is patent without any areas of stenosis noted.  I have encouraged him to stay as active as possible.  He is not a smoker.  I have ordered a follow-up ABI and graft duplex of the left leg in 1 year and I will see him back at that time.  He knows to call sooner if he has problems.  END-STAGE RENAL DISEASE: This patient has end-stage renal disease and is being dialyzed with a right IJ tunneled dialysis catheter which has had for about 2 months now.  He has a pacemaker on the left side so I would favor placing access on the right side.  I did look at his arteries and veins myself with the SonoSite.  The forearm cephalic vein looks quite small.  The upper arm cephalic vein might be reasonable although it is somewhat tortuous.  The basilic vein is marginal in size.  He appears to have a high bifurcation of the brachial artery.  I have recommended placement of a right brachiocephalic fistula as the first choice.  If this is not possible he might be able to have a basilic vein transposition it would likely be done in 2 stages.  If neither were possible that I would place an AV graft.  I have explained the indications for placement of an AV fistula or AV graft. I've explained that if at all possible we will place an AV fistula.  I have reviewed the risks of placement of an AV fistula including but not limited to: failure of the fistula to mature, need for subsequent interventions, and thrombosis. In addition I have reviewed the potential complications of placement of an AV graft. These risks include, but are not limited to, graft thrombosis, graft infection,  wound healing problems, bleeding, arm swelling, and steal syndrome. All the patient's questions were answered and they are agreeable to proceed with surgery.  His surgery is scheduled for Monday, 03/27/2021.  We will stop his Eliquis 48 hours prior to the procedure.  HPI:   Alexander [name] is a pleasant 60 y.o. male with history of peripheral vascular disease.  He ultimately required a right below the knee amputation which was done on 03/11/2020.  We are also following his left leg.  Had a left femoral to posterior tibial artery bypass graft by myself in December 2014.  Comes in for a routine follow-up visit.  He also tells me that he is now on dialysis and they would like [place] to place hemodialysis access.  He just got his prosthesis on the right and is beginning to ambulate.  He has no significant claudication on the left.  He denies any history of rest pain on the left.  He has had no nonhealing wounds on the left.  He denies any recent uremic symptoms.  Specifically, he denies nausea, vomiting, fatigue, anorexia, or palpitations.  He has a pacemaker on the left.  He is on Eliquis.   Past Medical History:  Diagnosis Date  . Alcohol abuse   . Anemia   . Arthritis    "patient does not think so."  . Atrial fibrillation ([place])   .  Cardiac syncope 10/07/14   rapid A fib with 8 sec pauses on converison with syncope- temp pacing wire placed then PPM  . Cataract    BILATERAL  . Chronic kidney disease   . Coronary artery disease   . Diabetes mellitus    dx---been  awhile  . Encounter for care of pacemaker 10/08/2014  . History of blood transfusion   . Hyperlipemia   . Hypertension   . Osteomyelitis ([place])    right foot  . Peripheral vascular disease ([place])   . Presence of permanent cardiac pacemaker 10/08/2014  . Sinus node dysfunction ([place]) 10/08/2014    Family History  Problem Relation Age of Onset  . Diabetes type II Mother   . Hypertension Mother   . Diabetes Mother   . Liver cancer  Father   . Diabetes type II Sister   . Breast cancer Sister   . Diabetes Sister   . Hypertension Sister   . Diabetes type II Brother   . Kidney failure Brother   . Diabetes Brother   . Hypertension Brother   . Diabetes type II Sister     SOCIAL HISTORY: Social History   Tobacco Use  . Smoking status: Former Smoker    Packs/day: 1.50    Years: 30.00    Pack years: 45.00    Types: Cigarettes    Quit date: 10/07/2014    Years since quitting: 6.4  . Smokeless tobacco: Never Used  Substance Use Topics  . Alcohol use: Yes    Alcohol/week: 6.0 standard drinks    Types: 6 Cans of beer per week    Comment: 2-3 beers daily    Allergies  Allergen Reactions  . Cytoxan [Cyclophosphamide]     pancytopenia  . Keflex [Cephalexin] Nausea And Vomiting  . Pletal [Cilostazol] Palpitations    "Can hear heart beating loudly".  . Sulfa Antibiotics Rash    Other reaction(s): Unknown    Current Outpatient Medications  Medication Sig Dispense Refill  . ACCU-CHEK FASTCLIX LANCETS MISC Check blood sugar TID & QHS 102 each 2  . amLODipine (NORVASC) 5 MG tablet Take 1 tablet (5 mg total) by mouth daily. 90 tablet 3  . atorvastatin (LIPITOR) 20 MG tablet Take 1 tablet by mouth once daily 90 tablet 0  . blood glucose meter kit and supplies KIT Check blood sugar TID & QHS 1 each 0  . Blood Glucose Monitoring Suppl (ACCU-CHEK ADVANTAGE DIABETES) kit Use as instructed 1 each 0  . glucose blood (ACCU-CHEK AVIVA PLUS) test strip Use as instructed 100 each 12  . glucose blood (CHOICE DM FORA G20 TEST STRIPS) test strip Check blood sugar TID & QHS 100 each 12  . glucose blood test strip Use as instructed 100 each 12  . insulin NPH-regular Human (70-30) 100 UNIT/ML injection Inject 25 Units into the skin 2 (two) times daily with a meal. 10 mL 11  . metoprolol tartrate (LOPRESSOR) 100 MG tablet Take 1 tablet (100 mg total) by mouth 2 (two) times daily. (Patient taking differently: Take 25 mg by mouth 2  (two) times daily.) 180 tablet 3  . Multiple Vitamin (MULTIVITAMIN ADULT) TABS Take 1 tablet by mouth daily.    Marland Kitchen apixaban (ELIQUIS) 5 MG TABS tablet Take 1 tablet (5 mg total) by mouth 2 (two) times daily. 180 tablet 0  . hydrALAZINE (APRESOLINE) 100 MG tablet Take 1 tablet (100 mg total) by mouth 2 (two) times daily. (Patient not taking: No sig reported) 180 tablet  3  . prazosin (MINIPRESS) 2 MG capsule TAKE 1 CAPSULE BY MOUTH ONCE DAILY AFTER SUPPER (Patient not taking: Reported on 03/15/2021) 90 capsule 0   No current facility-administered medications for this visit.    REVIEW OF SYSTEMS:  _0  denotes positive finding, _1  denotes negative finding Cardiac  Comments:  Chest pain or chest pressure:    Shortness of breath upon exertion:    Short of breath when lying flat:    Irregular heart rhythm:        Vascular    Pain in calf, thigh, or hip brought on by ambulation:    Pain in feet at night that wakes you up from your sleep:     Blood clot in your veins:    Leg swelling:         Pulmonary    Oxygen at home:    Productive cough:     Wheezing:         Neurologic    Sudden weakness in arms or legs:     Sudden numbness in arms or legs:     Sudden onset of difficulty speaking or slurred speech:    Temporary loss of vision in one eye:     Problems with dizziness:         Gastrointestinal    Blood in stool:     Vomited blood:         Genitourinary    Burning when urinating:     Blood in urine:        Psychiatric    Major depression:         Hematologic    Bleeding problems:    Problems with blood clotting too easily:        Skin    Rashes or ulcers:        Constitutional    Fever or chills:     PHYSICAL EXAM:   Vitals:   03/15/21 1300  BP: (!) 149/70  Pulse: 65  Resp: 20  Temp: 98.4 F (36.9 C)  SpO2: 97%  Weight: 270 lb (122.5 kg)  Height: 6' (1.829 m)   GENERAL: The patient is a well-nourished male, in no acute distress. The vital signs are  documented above. CARDIAC: There is a regular rate and rhythm.  VASCULAR: I do not detect carotid bruits. The left foot is warm and well-perfused. He has an easily palpable graft pulse which is tunneled superficially in the medial left leg. PULMONARY: There is good air exchange bilaterally without wheezing or rales. ABDOMEN: Soft and non-tender with normal pitched bowel sounds.  MUSCULOSKELETAL: There are no major deformities or cyanosis. NEUROLOGIC: No focal weakness or paresthesias are detected. SKIN: There are no ulcers or rashes noted. PSYCHIATRIC: The patient has a normal affect.  I looked at his right arm myself with the SonoSite.  The forearm cephalic vein looks small.  The upper arm cephalic vein looks reasonable in size although it is somewhat tortuous.  Patient appears to have a high bifurcation of the brachial artery on the right.  The basilic vein is marginal in size.  DATA:    GRAFT DUPLEX: I have independently interpreted his graft duplex scan today.  His left femoral to posterior tibial artery bypass graft is patent with biphasic flow throughout the graft and no areas of stenosis noted.  ARTERIAL DOPPLER STUDY: I have independently interpreted his arterial Doppler study today.  This was of the left lower extremity only.  He has a right below  the knee amputation.  He has monophasic Doppler signals in the dorsalis pedis and posterior tibial positions with an ABI of 99%.  This is likely falsely elevated because of calcific disease.  [name] [facility] [phone]

## 2021-03-15 NOTE — H&P (View-Only) (Signed)
  REASON FOR VISIT:   Follow-up of peripheral vascular disease and to evaluate for new hemodialysis access  MEDICAL ISSUES:   PERIPHERAL VASCULAR DISEASE: His right below the knee amputation site is healed nicely and he now has a prosthesis and is beginning to ambulate with this.  His left femoral to posterior tibial artery bypass which was done in 2014 is patent without any areas of stenosis noted.  I have encouraged him to stay as active as possible.  He is not a smoker.  I have ordered a follow-up ABI and graft duplex of the left leg in 1 year and I will see him back at that time.  He knows to call sooner if he has problems.  END-STAGE RENAL DISEASE: This patient has end-stage renal disease and is being dialyzed with a right IJ tunneled dialysis catheter which has had for about 2 months now.  He has a pacemaker on the left side so I would favor placing access on the right side.  I did look at his arteries and veins myself with the SonoSite.  The forearm cephalic vein looks quite small.  The upper arm cephalic vein might be reasonable although it is somewhat tortuous.  The basilic vein is marginal in size.  He appears to have a high bifurcation of the brachial artery.  I have recommended placement of a right brachiocephalic fistula as the first choice.  If this is not possible he might be able to have a basilic vein transposition it would likely be done in 2 stages.  If neither were possible that I would place an AV graft.  I have explained the indications for placement of an AV fistula or AV graft. I've explained that if at all possible we will place an AV fistula.  I have reviewed the risks of placement of an AV fistula including but not limited to: failure of the fistula to mature, need for subsequent interventions, and thrombosis. In addition I have reviewed the potential complications of placement of an AV graft. These risks include, but are not limited to, graft thrombosis, graft infection,  wound healing problems, bleeding, arm swelling, and steal syndrome. All the patient's questions were answered and they are agreeable to proceed with surgery.  His surgery is scheduled for Monday, 03/27/2021.  We will stop his Eliquis 48 hours prior to the procedure.  HPI:   Alexander [name] is a pleasant 60 y.o. male with history of peripheral vascular disease.  He ultimately required a right below the knee amputation which was done on 03/11/2020.  We are also following his left leg.  Had a left femoral to posterior tibial artery bypass graft by myself in December 2014.  Comes in for a routine follow-up visit.  He also tells me that he is now on dialysis and they would like us to place hemodialysis access.  He just got his prosthesis on the right and is beginning to ambulate.  He has no significant claudication on the left.  He denies any history of rest pain on the left.  He has had no nonhealing wounds on the left.  He denies any recent uremic symptoms.  Specifically, he denies nausea, vomiting, fatigue, anorexia, or palpitations.  He has a pacemaker on the left.  He is on Eliquis.   Past Medical History:  Diagnosis Date  . Alcohol abuse   . Anemia   . Arthritis    "patient does not think so."  . Atrial fibrillation ([facility])   .   Cardiac syncope 10/07/14   rapid A fib with 8 sec pauses on converison with syncope- temp pacing wire placed then PPM  . Cataract    BILATERAL  . Chronic kidney disease   . Coronary artery disease   . Diabetes mellitus    dx---been  awhile  . Encounter for care of pacemaker 10/08/2014  . History of blood transfusion   . Hyperlipemia   . Hypertension   . Osteomyelitis ([facility])    right foot  . Peripheral vascular disease ([facility])   . Presence of permanent cardiac pacemaker 10/08/2014  . Sinus node dysfunction ([facility]) 10/08/2014    Family History  Problem Relation Age of Onset  . Diabetes type II Mother   . Hypertension Mother   . Diabetes Mother   . Liver cancer  Father   . Diabetes type II Sister   . Breast cancer Sister   . Diabetes Sister   . Hypertension Sister   . Diabetes type II Brother   . Kidney failure Brother   . Diabetes Brother   . Hypertension Brother   . Diabetes type II Sister     SOCIAL HISTORY: Social History   Tobacco Use  . Smoking status: Former Smoker    Packs/day: 1.50    Years: 30.00    Pack years: 45.00    Types: Cigarettes    Quit date: 10/07/2014    Years since quitting: 6.4  . Smokeless tobacco: Never Used  Substance Use Topics  . Alcohol use: Yes    Alcohol/week: 6.0 standard drinks    Types: 6 Cans of beer per week    Comment: 2-3 beers daily    Allergies  Allergen Reactions  . Cytoxan [Cyclophosphamide]     pancytopenia  . Keflex [Cephalexin] Nausea And Vomiting  . Pletal [Cilostazol] Palpitations    "Can hear heart beating loudly".  . Sulfa Antibiotics Rash    Other reaction(s): Unknown    Current Outpatient Medications  Medication Sig Dispense Refill  . ACCU-CHEK FASTCLIX LANCETS MISC Check blood sugar TID & QHS 102 each 2  . amLODipine (NORVASC) 5 MG tablet Take 1 tablet (5 mg total) by mouth daily. 90 tablet 3  . atorvastatin (LIPITOR) 20 MG tablet Take 1 tablet by mouth once daily 90 tablet 0  . blood glucose meter kit and supplies KIT Check blood sugar TID & QHS 1 each 0  . Blood Glucose Monitoring Suppl (ACCU-CHEK ADVANTAGE DIABETES) kit Use as instructed 1 each 0  . glucose blood (ACCU-CHEK AVIVA PLUS) test strip Use as instructed 100 each 12  . glucose blood (CHOICE DM FORA G20 TEST STRIPS) test strip Check blood sugar TID & QHS 100 each 12  . glucose blood test strip Use as instructed 100 each 12  . insulin NPH-regular Human (70-30) 100 UNIT/ML injection Inject 25 Units into the skin 2 (two) times daily with a meal. 10 mL 11  . metoprolol tartrate (LOPRESSOR) 100 MG tablet Take 1 tablet (100 mg total) by mouth 2 (two) times daily. (Patient taking differently: Take 25 mg by mouth 2  (two) times daily.) 180 tablet 3  . Multiple Vitamin (MULTIVITAMIN ADULT) TABS Take 1 tablet by mouth daily.    . apixaban (ELIQUIS) 5 MG TABS tablet Take 1 tablet (5 mg total) by mouth 2 (two) times daily. 180 tablet 0  . hydrALAZINE (APRESOLINE) 100 MG tablet Take 1 tablet (100 mg total) by mouth 2 (two) times daily. (Patient not taking: No sig reported) 180 tablet   3  . prazosin (MINIPRESS) 2 MG capsule TAKE 1 CAPSULE BY MOUTH ONCE DAILY AFTER SUPPER (Patient not taking: Reported on 03/15/2021) 90 capsule 0   No current facility-administered medications for this visit.    REVIEW OF SYSTEMS:  [X] denotes positive finding, [ ] denotes negative finding Cardiac  Comments:  Chest pain or chest pressure:    Shortness of breath upon exertion:    Short of breath when lying flat:    Irregular heart rhythm:        Vascular    Pain in calf, thigh, or hip brought on by ambulation:    Pain in feet at night that wakes you up from your sleep:     Blood clot in your veins:    Leg swelling:         Pulmonary    Oxygen at home:    Productive cough:     Wheezing:         Neurologic    Sudden weakness in arms or legs:     Sudden numbness in arms or legs:     Sudden onset of difficulty speaking or slurred speech:    Temporary loss of vision in one eye:     Problems with dizziness:         Gastrointestinal    Blood in stool:     Vomited blood:         Genitourinary    Burning when urinating:     Blood in urine:        Psychiatric    Major depression:         Hematologic    Bleeding problems:    Problems with blood clotting too easily:        Skin    Rashes or ulcers:        Constitutional    Fever or chills:     PHYSICAL EXAM:   Vitals:   03/15/21 1300  BP: (!) 149/70  Pulse: 65  Resp: 20  Temp: 98.4 F (36.9 C)  SpO2: 97%  Weight: 270 lb (122.5 kg)  Height: 6' (1.829 m)   GENERAL: The patient is a well-nourished male, in no acute distress. The vital signs are  documented above. CARDIAC: There is a regular rate and rhythm.  VASCULAR: I do not detect carotid bruits. The left foot is warm and well-perfused. He has an easily palpable graft pulse which is tunneled superficially in the medial left leg. PULMONARY: There is good air exchange bilaterally without wheezing or rales. ABDOMEN: Soft and non-tender with normal pitched bowel sounds.  MUSCULOSKELETAL: There are no major deformities or cyanosis. NEUROLOGIC: No focal weakness or paresthesias are detected. SKIN: There are no ulcers or rashes noted. PSYCHIATRIC: The patient has a normal affect.  I looked at his right arm myself with the SonoSite.  The forearm cephalic vein looks small.  The upper arm cephalic vein looks reasonable in size although it is somewhat tortuous.  Patient appears to have a high bifurcation of the brachial artery on the right.  The basilic vein is marginal in size.  DATA:    GRAFT DUPLEX: I have independently interpreted his graft duplex scan today.  His left femoral to posterior tibial artery bypass graft is patent with biphasic flow throughout the graft and no areas of stenosis noted.  ARTERIAL DOPPLER STUDY: I have independently interpreted his arterial Doppler study today.  This was of the left lower extremity only.  He has a right below   the knee amputation.  He has monophasic Doppler signals in the dorsalis pedis and posterior tibial positions with an ABI of 99%.  This is likely falsely elevated because of calcific disease.  Glendale Wherry [facility] [place] [facility] [phone] 

## 2021-03-16 DIAGNOSIS — Z992 Dependence on renal dialysis: Secondary | ICD-10-CM | POA: Diagnosis not present

## 2021-03-16 DIAGNOSIS — T8249XA Other complication of vascular dialysis catheter, initial encounter: Secondary | ICD-10-CM | POA: Diagnosis not present

## 2021-03-16 DIAGNOSIS — N186 End stage renal disease: Secondary | ICD-10-CM | POA: Diagnosis not present

## 2021-03-16 DIAGNOSIS — N2581 Secondary hyperparathyroidism of renal origin: Secondary | ICD-10-CM | POA: Diagnosis not present

## 2021-03-16 DIAGNOSIS — D631 Anemia in chronic kidney disease: Secondary | ICD-10-CM | POA: Diagnosis not present

## 2021-03-16 DIAGNOSIS — D689 Coagulation defect, unspecified: Secondary | ICD-10-CM | POA: Diagnosis not present

## 2021-03-16 DIAGNOSIS — E0821 Diabetes mellitus due to underlying condition with diabetic nephropathy: Secondary | ICD-10-CM | POA: Diagnosis not present

## 2021-03-18 DIAGNOSIS — N186 End stage renal disease: Secondary | ICD-10-CM | POA: Diagnosis not present

## 2021-03-18 DIAGNOSIS — E0821 Diabetes mellitus due to underlying condition with diabetic nephropathy: Secondary | ICD-10-CM | POA: Diagnosis not present

## 2021-03-18 DIAGNOSIS — D689 Coagulation defect, unspecified: Secondary | ICD-10-CM | POA: Diagnosis not present

## 2021-03-18 DIAGNOSIS — N2581 Secondary hyperparathyroidism of renal origin: Secondary | ICD-10-CM | POA: Diagnosis not present

## 2021-03-18 DIAGNOSIS — D631 Anemia in chronic kidney disease: Secondary | ICD-10-CM | POA: Diagnosis not present

## 2021-03-18 DIAGNOSIS — Z992 Dependence on renal dialysis: Secondary | ICD-10-CM | POA: Diagnosis not present

## 2021-03-18 DIAGNOSIS — T8249XA Other complication of vascular dialysis catheter, initial encounter: Secondary | ICD-10-CM | POA: Diagnosis not present

## 2021-03-20 ENCOUNTER — Ambulatory Visit (INDEPENDENT_AMBULATORY_CARE_PROVIDER_SITE_OTHER): Payer: Medicare Other | Admitting: Physical Therapy

## 2021-03-20 ENCOUNTER — Other Ambulatory Visit: Payer: Self-pay

## 2021-03-20 ENCOUNTER — Encounter: Payer: Self-pay | Admitting: Physical Therapy

## 2021-03-20 ENCOUNTER — Other Ambulatory Visit: Payer: Self-pay | Admitting: Orthopedic Surgery

## 2021-03-20 ENCOUNTER — Telehealth: Payer: Self-pay | Admitting: Physical Therapy

## 2021-03-20 DIAGNOSIS — M21372 Foot drop, left foot: Secondary | ICD-10-CM

## 2021-03-20 DIAGNOSIS — R2681 Unsteadiness on feet: Secondary | ICD-10-CM | POA: Diagnosis not present

## 2021-03-20 DIAGNOSIS — R293 Abnormal posture: Secondary | ICD-10-CM | POA: Diagnosis not present

## 2021-03-20 DIAGNOSIS — Z89511 Acquired absence of right leg below knee: Secondary | ICD-10-CM

## 2021-03-20 DIAGNOSIS — R2689 Other abnormalities of gait and mobility: Secondary | ICD-10-CM

## 2021-03-20 NOTE — Therapy (Signed)
[facility] Physical Therapy [address] [place], [place], [address] Phone: [phone]   Fax:  [phone]  Physical Therapy Treatment  Patient Details  Name: Alexander [name] MRN: [id number] Date of Birth: Jan 01, 1961 Referring Provider (PT): Meridee Score, MD   Encounter Date: 03/20/2021   PT End of Session - 03/20/21 0850    Visit Number 19    Number of Visits 25    Date for PT Re-Evaluation 04/04/21    Authorization Type BCBS Medicare    Authorization Time Period $40 co-pay    PT Start Time 0847    PT Stop Time 0928    PT Time Calculation (min) 41 min    Equipment Utilized During Treatment Gait belt    Activity Tolerance Patient tolerated treatment well    Behavior During Therapy [facility] for tasks assessed/performed           Past Medical History:  Diagnosis Date  . Alcohol abuse   . Anemia   . Arthritis    "patient does not think so."  . Atrial fibrillation ([place])   . Cardiac syncope 10/07/14   rapid A fib with 8 sec pauses on converison with syncope- temp pacing wire placed then PPM  . Cataract    BILATERAL  . Chronic kidney disease   . Coronary artery disease   . Diabetes mellitus    dx---been  awhile  . Encounter for care of pacemaker 10/08/2014  . History of blood transfusion   . Hyperlipemia   . Hypertension   . Osteomyelitis ([place])    right foot  . Peripheral vascular disease ([place])   . Presence of permanent cardiac pacemaker 10/08/2014  . Sinus node dysfunction ([place]) 10/08/2014    Past Surgical History:  Procedure Laterality Date  . ABDOMINAL AORTOGRAM W/LOWER EXTREMITY N/A 09/09/2017   Procedure: ABDOMINAL AORTOGRAM W/LOWER EXTREMITY;  Surgeon: Angelia Mould, MD;  Location: [place] [facility];  Service: Cardiovascular;  Laterality: N/A;  . ABDOMINAL AORTOGRAM W/LOWER EXTREMITY Right 11/11/2019   Procedure: ABDOMINAL AORTOGRAM W/LOWER EXTREMITY;  Surgeon: Marty Heck, MD;  Location: [place] [facility];  Service:  Cardiovascular;  Laterality: Right;  . AMPUTATION Left 08/19/2013   Procedure: AMPUTATION RAY;  Surgeon: Alta Corning, MD;  Location: [place];  Service: Orthopedics;  Laterality: Left;  ray amputation left 5th  . AMPUTATION Left 10/27/2013   Procedure: AMPUTATION DIGIT-LEFT 4TH TOE, 4th and 5th metatarsal.;  Surgeon: Angelia Mould, MD;  Location: [place];  Service: Vascular;  Laterality: Left;  . AMPUTATION Left 11/04/2013   Procedure: LEFT FOOT TRANS-METATARSAL AMPUTATION WITH WOUND CLOSURE ;  Surgeon: Alta Corning, MD;  Location: [place];  Service: Orthopedics;  Laterality: Left;  . AMPUTATION Right 09/10/2017   Procedure: RIGHT FOURTH AND FIFTH TOE AMPUTATION;  Surgeon: Angelia Mould, MD;  Location: [place];  Service: Vascular;  Laterality: Right;  . AMPUTATION Right 02/19/2020   Procedure: AMPUTATION RIGHT 3RD TOE;  Surgeon: Angelia Mould, MD;  Location: [place];  Service: Vascular;  Laterality: Right;  . AMPUTATION Right 03/11/2020   Procedure: RIGHT BELOW KNEE AMPUTATION;  Surgeon: Newt Minion, MD;  Location: [place];  Service: Orthopedics;  Laterality: Right;  . APPLICATION OF WOUND VAC Right 02/19/2020   Procedure: Application Of Wound Vac Right foot;  Surgeon: Angelia Mould, MD;  Location: [place];  Service: Vascular;  Laterality: Right;  . BIOPSY  07/20/2020   Procedure: BIOPSY;  Surgeon: Rush Landmark Telford Nab., MD;  Location:  [place] [facility];  Service: Gastroenterology;;  . CARDIAC CATHETERIZATION  10/07/2014   Procedure: LEFT HEART CATH AND CORONARY ANGIOGRAPHY;  Surgeon: Laverda Page, MD;  Location: [facility];  Service: Cardiovascular;;  . COLONOSCOPY W/ POLYPECTOMY    . COLONOSCOPY WITH PROPOFOL N/A 05/25/2020   Procedure: COLONOSCOPY WITH PROPOFOL;  Surgeon: Gatha Mayer, MD;  Location: [facility];  Service: Endoscopy;  Laterality: N/A;  . EMBOLECTOMY Right 09/12/2017   Procedure: Thrombectomy  & Redo Right Below Knee Popliteal Artery Bypass Graft  ;   Surgeon: Waynetta Sandy, MD;  Location: [place];  Service: Vascular;  Laterality: Right;  . ENTEROSCOPY N/A 07/20/2020   Procedure: ENTEROSCOPY;  Surgeon: Mansouraty, Telford Nab., MD;  Location: [place];  Service: Gastroenterology;  Laterality: N/A;  . ESOPHAGOGASTRODUODENOSCOPY (EGD) WITH PROPOFOL N/A 05/24/2020   Procedure: ESOPHAGOGASTRODUODENOSCOPY (EGD) WITH PROPOFOL;  Surgeon: Ladene Artist, MD;  Location: [facility];  Service: Endoscopy;  Laterality: N/A;  . EYE SURGERY Bilateral    cataract  . FEMORAL-TIBIAL BYPASS GRAFT Left 10/27/2013   Procedure: BYPASS GRAFT LEFT FEMORAL- POSTERIOR TIBIAL ARTERY;  Surgeon: Angelia Mould, MD;  Location: [place];  Service: Vascular;  Laterality: Left;  . FEMORAL-TIBIAL BYPASS GRAFT Right 09/10/2017   Procedure: RIGHT SUPERFICIAL  FEMORAL ARTERY-BELOW KNEE POPLITEAL ARTERY BYPASS GRAFT WITH VEIN;  Surgeon: Angelia Mould, MD;  Location: [place];  Service: Vascular;  Laterality: Right;  . GIVENS CAPSULE STUDY N/A 07/20/2020   Procedure: GIVENS CAPSULE STUDY;  Surgeon: Irving Copas., MD;  Location: [place];  Service: Gastroenterology;  Laterality: N/A;  capsule placed through scope into duodenum @ 0936  . I & D EXTREMITY Left 10/27/2013   Procedure: IRRIGATION AND DEBRIDEMENT EXTREMITY- LEFT FOOT;  Surgeon: Angelia Mould, MD;  Location: [place];  Service: Vascular;  Laterality: Left;  . I & D EXTREMITY Right 01/26/2020   Procedure: IRRIGATION AND DEBRIDEMENT EXTREMITY RIGHT FOOT WOUND;  Surgeon: Angelia Mould, MD;  Location: [place];  Service: Vascular;  Laterality: Right;  . INTRAOPERATIVE ARTERIOGRAM Right 09/10/2017   Procedure: INTRA OPERATIVE ARTERIOGRAM;  Surgeon: Angelia Mould, MD;  Location: [place];  Service: Vascular;  Laterality: Right;  . IR ANGIOGRAM FOLLOW UP STUDY  09/09/2017  . IR FLUORO GUIDE CV LINE RIGHT  05/31/2020  . IR US GUIDE VASC ACCESS RIGHT  06/01/2020  . LEFT HEART CATH  AND CORONARY ANGIOGRAPHY N/A 08/19/2020   Procedure: LEFT HEART CATH AND CORONARY ANGIOGRAPHY;  Surgeon: Adrian Prows, MD;  Location: [place] [facility];  Service: Cardiovascular;  Laterality: N/A;  . LEFT HEART CATHETERIZATION WITH CORONARY ANGIOGRAM N/A 11/09/2014   Procedure: LEFT HEART CATHETERIZATION WITH CORONARY ANGIOGRAM;  Surgeon: Laverda Page, MD;  Location: [facility] CATH LAB;  Service: Cardiovascular;  Laterality: N/A;  . LOWER EXTREMITY ANGIOGRAM Right 11/08/2015   Procedure: Lower Extremity Angiogram;  Surgeon: Serafina Mitchell, MD;  Location: [place] [facility];  Service: Cardiovascular;  Laterality: Right;  . LUMBAR LAMINECTOMY  06/13/2012   Procedure: MICRODISCECTOMY LUMBAR LAMINECTOMY;  Surgeon: Jessy Oto, MD;  Location: [place];  Service: Orthopedics;  Laterality: N/A;  Central laminectomy L2-3, L3-4, L4-5  . PACEMAKER INSERTION  10/08/14   MDT Advisa MRI compatible dual chamber pacemaker implanted by Dr Caryl Comes for syncope with post-termination pauses  . PERCUTANEOUS CORONARY STENT INTERVENTION (PCI-S)  11/09/2014   des to lad & distal circumflex         Dr  [name]  . PERIPHERAL VASCULAR  BALLOON ANGIOPLASTY Right 11/11/2019   Procedure: PERIPHERAL VASCULAR BALLOON ANGIOPLASTY;  Surgeon: Marty Heck, MD;  Location: [place] [facility];  Service: Cardiovascular;  Laterality: Right;  below knee popliteal, tibioperoneal trunk, posterior tibial arteries  . PERIPHERAL VASCULAR CATHETERIZATION N/A 11/08/2015   Procedure: Abdominal Aortogram;  Surgeon: Serafina Mitchell, MD;  Location: [place] [facility];  Service: Cardiovascular;  Laterality: N/A;  . PERIPHERAL VASCULAR CATHETERIZATION Right 11/08/2015   Procedure: Peripheral Vascular Atherectomy;  Surgeon: Serafina Mitchell, MD;  Location: [place] [facility];  Service: Cardiovascular;  Laterality: Right;  . PERIPHERAL VASCULAR CATHETERIZATION N/A 10/10/2016   Procedure: Lower Extremity Angiography;  Surgeon: Waynetta Sandy, MD;   Location: [place] [facility];  Service: Cardiovascular;  Laterality: N/A;  . PERIPHERAL VASCULAR CATHETERIZATION Right 10/10/2016   Procedure: Peripheral Vascular Atherectomy;  Surgeon: Waynetta Sandy, MD;  Location: [place] [facility];  Service: Cardiovascular;  Laterality: Right;  Popliteal  . PERMANENT PACEMAKER INSERTION N/A 10/08/2014   Procedure: PERMANENT PACEMAKER INSERTION;  Surgeon: Deboraha Sprang, MD;  Location: [facility] C[facility];  Service: Cardiovascular;  Laterality: N/A;  . SUBMUCOSAL TATTOO INJECTION  07/20/2020   Procedure: SUBMUCOSAL TATTOO INJECTION;  Surgeon: Irving Copas., MD;  Location: [place];  Service: Gastroenterology;;  . TEMPORARY PACEMAKER INSERTION Bilateral 10/07/2014   Procedure: TEMPORARY PACEMAKER INSERTION;  Surgeon: Laverda Page, MD;  Location: [facility] CATH LAB;  Service: Cardiovascular;  Laterality: Bilateral;  . WOUND DEBRIDEMENT Right 02/19/2020   Procedure: DEBRIDEMENT WOUND RIGHT FOOT;  Surgeon: Angelia Mould, MD;  Location: [place];  Service: Vascular;  Laterality: Right;    There were no vitals filed for this visit.   Subjective Assessment - 03/20/21 0850    Subjective He is wearing prosthesis daily most of awake hours including dialysis without issues.    Pertinent History Rt TTA, artthritis, A-Fib, cardiac syncope, cataract, CKD, CAD, DM, ESRD w/HD since 7/21, HTN, PVD, cardiac pacemaker,    Limitations Walking;House hold activities    Patient Stated Goals to use prosthesis to walk in community, to hunt & fish, yardwork & garden    Currently in Pain? No/denies                             [facility] Adult PT Treatment/Exercise - 03/20/21 0847      Transfers   Transfers Sit to Stand;Stand to Sit    Sit to Stand 5: Supervision;With upper extremity assist;With armrests;From chair/3-in-1;Other (comment)   to forearm crutches   Sit to Stand Details Verbal cues for safe use of DME/AE    Stand to Sit With upper  extremity assist;With armrests;To chair/3-in-1;Other (comment);5: Supervision   from forearm crutches   Stand to Sit Details (indicate cue type and reason) Verbal cues for safe use of DME/AE      Ambulation/Gait   Ambulation/Gait Yes    Ambulation/Gait Assistance 5: Supervision   sup bil forearm crutches & single crutch   Ambulation Distance (Feet) 200 Feet   200' bil crutches & 50' single crutch   Assistive device Prosthesis;L Forearm Crutch;Crutches    Ambulation Surface Level;Indoor    Ramp 4: Min assist;5: Supervision   bil forearm crutches & TTA prosthesis   Ramp Details (indicate cue type and reason) verbal & tactile cues on technique    Curb 5: Supervision   bil forearm crutches & TTA prosthesis   Curb Details (indicate cue type and reason) verbal &  tactile cues on technique      Neuro Re-ed    Neuro Re-ed Details  PT instructed with demo & verbal cues in standing back to doorframe with overhead reach single UE & BUEs 2 reps ea 2 deep breathes with goal to increase frequency during day and standing at counter with BUE on counter with goal to increase time.      Exercises   Exercises Knee/Hip      Knee/Hip Exercises: Aerobic   Nustep --      Knee/Hip Exercises: Machines for Strengthening   Cybex Leg Press --      Knee/Hip Exercises: Standing   Rocker Board --    Diplomatic Services operational officer Limitations --    Other Standing Knee Exercises --      Knee/Hip Exercises: Seated   Sit to Sand --      Prosthetics   Current prosthetic wear tolerance (days/week)  daily    Current prosthetic wear tolerance (#hours/day)  nondialysis days & after dialysis all awake hours except 2 hrs midday    Current prosthetic weight-bearing tolerance (hours/day)  Patient tolerated standing & gait with partial weight on prosthesis for 5 minutes with no c/o limb pain or discomfort.                    PT Short Term Goals - 03/06/21 1609      PT SHORT TERM GOAL #1   Title Patient verbalizes proper  weighing & prosthetic issues with dialysis.    Time 1    Period Months    Status Achieved    Target Date 03/03/21      PT SHORT TERM GOAL #2   Title Pateint tolerates prosthesis wear >90% of awake hours / day with no skin issues.    Time 1    Period Months    Status Achieved    Target Date 03/03/21      PT SHORT TERM GOAL #3   Title Pateint is able to reach 10" anteriorly & pick up item from floor with RW safely.    Time 1    Period Months    Status Achieved    Target Date 03/03/21      PT SHORT TERM GOAL #4   Title Pateint ambulates 300' with RW & prosthesis modified independent.    Time 1    Period Months    Status Achieved    Target Date 03/03/21      PT SHORT TERM GOAL #5   Title Patient negotiates ramps & curbs with RW & prosthesis with supervision    Time 1    Period Months    Status Achieved    Target Date 03/03/21      PT SHORT TERM GOAL #6   Title Patient ambulates 31' with cane & prosthesis with minA    Time 4    Period Weeks    Status Achieved    Target Date 03/03/21             PT Long Term Goals - 01/18/21 0941      PT LONG TERM GOAL #1   Title Patient verbalizes & demonstrates understanding of prosthetic care to enable safe utilization of prosthesis.    Time 3    Period Months    Status On-going    Target Date 04/03/21      PT LONG TERM GOAL #2   Title Patient tolerates wear of prosthesis >90% of awake hours without skin or limb  pain issues to enable function throughout his day.    Time 3    Period Months    Status On-going    Target Date 04/03/21      PT LONG TERM GOAL #3   Title Task of [place] Balance with RW  >/= 45/56    Time 3    Period Months    Status On-going    Target Date 04/03/21      PT LONG TERM GOAL #4   Title Patient ambulates >400' with LRAD & prosthesis modified independent.    Time 3    Period Months    Status On-going    Target Date 04/03/21      PT LONG TERM GOAL #5   Title Patient negotiates ramps, curbs &  stairs with LRAD & prosthesis modified independent.    Time 3    Period Months    Status On-going    Target Date 04/03/21                 Plan - 03/20/21 0850    Clinical Impression Statement Patient is scheduled for surgery next Monday for fistula in right upper arm.  He wants to wrap up PT this week. Session today focused on safe mobility with forearm crutches and prosthesis.  He is limited by deconditioning. PT instructed in standing posture activities which he appears to understand.    Personal Factors and Comorbidities Comorbidity 3+;Fitness;Time since onset of injury/illness/exacerbation;Past/Current Experience    Comorbidities Rt TTA, left Transmetatarsal Amputation, artthritis, A-Fib, cardiac syncope, cataract, CKD, CAD, DM, ESRD w/HD since 7/21, HTN, PVD, cardiac pacemaker,    Examination-Activity Limitations Locomotion Level;Squat;Stairs;Stand;Toileting;Transfers    Examination-Participation Restrictions Community Activity    Stability/Clinical Decision Making Evolving/Moderate complexity    Rehab Potential Good    PT Frequency 2x / week    PT Duration 12 weeks    PT Treatment/Interventions ADLs/Self Care Home Management;DME Instruction;Gait training;Stair training;Functional mobility training;Therapeutic activities;Therapeutic exercise;Balance training;Neuromuscular re-education;Patient/family education;Prosthetic Training;Vestibular    PT Next Visit Plan discharge PT per pt request next visit    Consulted and Agree with Plan of Care Patient           Patient will benefit from skilled therapeutic intervention in order to improve the following deficits and impairments:  Abnormal gait,Cardiopulmonary status limiting activity,Decreased activity tolerance,Decreased balance,Decreased endurance,Decreased knowledge of use of DME,Decreased mobility,Decreased scar mobility,Decreased strength,Increased edema,Impaired flexibility,Postural dysfunction,Prosthetic  Dependency,Obesity  Visit Diagnosis: Unsteadiness on feet  Abnormal posture  Foot drop, left  Other abnormalities of gait and mobility     Problem List Patient Active Problem List   Diagnosis Date Noted  . NSVT (nonsustained ventricular tachycardia) ([place])   . Pancytopenia ([place])   . Acute blood loss anemia 07/18/2020  . Hemodialysis patient ([place]) 06/04/2020  . Necrotizing glomerulonephritis 06/04/2020  . Heme positive stool   . Melena   . Upper GI bleeding 05/23/2020  . Acute GI bleeding 05/23/2020  . Acute hematogenous osteomyelitis of right foot ([place]) 03/11/2020  . Gangrene of right foot ([place])   . Diabetic foot ulcer associated with type 2 diabetes mellitus ([place]) 02/19/2020  . Heel ulcer ([place]) 11/05/2019  . Atherosclerotic PVD with ulceration ([place]) 09/09/2017  . Essential hypertension, benign 02/07/2015  . Renal insufficiency 02/07/2015  . Post PTCA 11/09/2014  . S/P PTCA (percutaneous transluminal coronary angioplasty) 11/09/2014  . Pacemaker: [place] DR MRI [id number] Dual chamber pacemaker 10/08/2014 10/08/2014  . Encounter for care of pacemaker 10/08/2014  . Sinus node dysfunction ([facility])  10/08/2014  . Cardiac asystole ([place]) 10/07/2014  . Cardiac syncope 10/07/2014  . Diabetes mellitus with stage 3 chronic kidney disease ([place]) 10/07/2014  . CAD (coronary artery disease), native coronary artery 10/07/2014  . Diabetes mellitus due to underlying condition without complications ([place]) 03/75/4360  . Anemia, unspecified 05/25/2014  . Syncope 04/30/2014  . Atherosclerosis of native arteries of the extremities with gangrene ([place]) 12/16/2013  . Volume overload 10/30/2013  . Acute renal failure ([place]) 10/28/2013  . PAD (peripheral artery disease) ([place]) 10/27/2013  . Leukocytosis 10/21/2013  . Anemia 10/21/2013  . Osteomyelitis of left foot ([place]) 08/19/2013  . Spinal stenosis, lumbar region, with neurogenic claudication 06/12/2012    Class: Diagnosis of  . DIABETES  MELLITUS, TYPE II 03/15/2009  . ALCOHOL ABUSE 03/15/2009  . TOBACCO USE 03/15/2009  . HYPERTENSION 03/15/2009    Jamey Reas, PT, DPT 03/20/2021, 9:31 AM  [facility] Physical Therapy [address] [place], [place], [address] Phone: [phone]   Fax:  [phone]  Name: AVYON HERENDEEN MRN: [id number] Date of Birth: [birth year]

## 2021-03-20 NOTE — Telephone Encounter (Signed)
I placed an order through [employer] and pt should be getting a call from [employer] to discuss delivery and any possible OOP expense there may be. Let me know if you need anything else! I will track the order until complete.

## 2021-03-20 NOTE — Telephone Encounter (Signed)
Alexander [name] needs bilateral forearm crutches for his prosthetic gait. Can you please place an order for bilateral forearm crutches? Thank you Shirlean Mylar

## 2021-03-21 DIAGNOSIS — D689 Coagulation defect, unspecified: Secondary | ICD-10-CM | POA: Diagnosis not present

## 2021-03-21 DIAGNOSIS — N186 End stage renal disease: Secondary | ICD-10-CM | POA: Diagnosis not present

## 2021-03-21 DIAGNOSIS — T8249XA Other complication of vascular dialysis catheter, initial encounter: Secondary | ICD-10-CM | POA: Diagnosis not present

## 2021-03-21 DIAGNOSIS — Z992 Dependence on renal dialysis: Secondary | ICD-10-CM | POA: Diagnosis not present

## 2021-03-21 DIAGNOSIS — E0821 Diabetes mellitus due to underlying condition with diabetic nephropathy: Secondary | ICD-10-CM | POA: Diagnosis not present

## 2021-03-21 DIAGNOSIS — N2581 Secondary hyperparathyroidism of renal origin: Secondary | ICD-10-CM | POA: Diagnosis not present

## 2021-03-21 DIAGNOSIS — D631 Anemia in chronic kidney disease: Secondary | ICD-10-CM | POA: Diagnosis not present

## 2021-03-22 ENCOUNTER — Other Ambulatory Visit: Payer: Self-pay

## 2021-03-22 ENCOUNTER — Ambulatory Visit (INDEPENDENT_AMBULATORY_CARE_PROVIDER_SITE_OTHER): Payer: Medicare Other | Admitting: Physical Therapy

## 2021-03-22 ENCOUNTER — Encounter: Payer: Self-pay | Admitting: Physical Therapy

## 2021-03-22 DIAGNOSIS — M21372 Foot drop, left foot: Secondary | ICD-10-CM

## 2021-03-22 DIAGNOSIS — R2681 Unsteadiness on feet: Secondary | ICD-10-CM | POA: Diagnosis not present

## 2021-03-22 DIAGNOSIS — R293 Abnormal posture: Secondary | ICD-10-CM | POA: Diagnosis not present

## 2021-03-22 DIAGNOSIS — R2689 Other abnormalities of gait and mobility: Secondary | ICD-10-CM | POA: Diagnosis not present

## 2021-03-22 NOTE — Therapy (Signed)
[facility] Physical Therapy [address] [place], [place], [address] Phone: [phone]   Fax:  [phone]  Physical Therapy Treatment & Discharge Summary  Patient Details  Name: Alexander [name] MRN: [id number] Date of Birth: [birth year] Referring Provider (PT): Meridee Score, MD   Encounter Date: 03/22/2021  PHYSICAL THERAPY DISCHARGE SUMMARY  Visits from Start of Care: 20  Current functional level related to goals / functional outcomes: See below   Remaining deficits: See below   Education / Equipment: Prosthetic care & HEP.  Forearm crutches ordered.   Plan: Patient agrees to discharge.  Patient goals were partially met. Patient is being discharged due to meeting the stated rehab goals.  ?????            PT End of Session - 03/22/21 1329    Visit Number 20    Number of Visits 25    Date for PT Re-Evaluation 04/04/21    Authorization Type BCBS Medicare    Authorization Time Period $40 co-pay    PT Start Time 0857    PT Stop Time 0935    PT Time Calculation (min) 38 min    Equipment Utilized During Treatment Gait belt    Activity Tolerance Patient tolerated treatment well    Behavior During Therapy WFL for tasks assessed/performed           Past Medical History:  Diagnosis Date  . Alcohol abuse   . Anemia   . Arthritis    "patient does not think so."  . Atrial fibrillation ([place])   . Cardiac syncope 10/07/14   rapid A fib with 8 sec pauses on converison with syncope- temp pacing wire placed then PPM  . Cataract    BILATERAL  . Chronic kidney disease   . Coronary artery disease   . Diabetes mellitus    dx---been  awhile  . Encounter for care of pacemaker 10/08/2014  . History of blood transfusion   . Hyperlipemia   . Hypertension   . Osteomyelitis ([place])    right foot  . Peripheral vascular disease ([place])   . Presence of permanent cardiac pacemaker 10/08/2014  . Sinus node dysfunction ([place]) 10/08/2014    Past Surgical  History:  Procedure Laterality Date  . ABDOMINAL AORTOGRAM W/LOWER EXTREMITY N/A 09/09/2017   Procedure: ABDOMINAL AORTOGRAM W/LOWER EXTREMITY;  Surgeon: Angelia Mould, MD;  Location: [place] [facility];  Service: Cardiovascular;  Laterality: N/A;  . ABDOMINAL AORTOGRAM W/LOWER EXTREMITY Right 11/11/2019   Procedure: ABDOMINAL AORTOGRAM W/LOWER EXTREMITY;  Surgeon: Marty Heck, MD;  Location: [place] [facility];  Service: Cardiovascular;  Laterality: Right;  . AMPUTATION Left 08/19/2013   Procedure: AMPUTATION RAY;  Surgeon: Alta Corning, MD;  Location: [place];  Service: Orthopedics;  Laterality: Left;  ray amputation left 5th  . AMPUTATION Left 10/27/2013   Procedure: AMPUTATION DIGIT-LEFT 4TH TOE, 4th and 5th metatarsal.;  Surgeon: Angelia Mould, MD;  Location: [place];  Service: Vascular;  Laterality: Left;  . AMPUTATION Left 11/04/2013   Procedure: LEFT FOOT TRANS-METATARSAL AMPUTATION WITH WOUND CLOSURE ;  Surgeon: Alta Corning, MD;  Location: [place];  Service: Orthopedics;  Laterality: Left;  . AMPUTATION Right 09/10/2017   Procedure: RIGHT FOURTH AND FIFTH TOE AMPUTATION;  Surgeon: Angelia Mould, MD;  Location: [place];  Service: Vascular;  Laterality: Right;  . AMPUTATION Right 02/19/2020   Procedure: AMPUTATION RIGHT 3RD TOE;  Surgeon: Angelia Mould, MD;  Location: [place];  Service: Vascular;  Laterality: Right;  . AMPUTATION Right 03/11/2020   Procedure: RIGHT BELOW KNEE AMPUTATION;  Surgeon: Newt Minion, MD;  Location: [place];  Service: Orthopedics;  Laterality: Right;  . APPLICATION OF WOUND VAC Right 02/19/2020   Procedure: Application Of Wound Vac Right foot;  Surgeon: Angelia Mould, MD;  Location: [place];  Service: Vascular;  Laterality: Right;  . BIOPSY  07/20/2020   Procedure: BIOPSY;  Surgeon: Rush Landmark Telford Nab., MD;  Location: [place];  Service: Gastroenterology;;  . CARDIAC CATHETERIZATION  10/07/2014   Procedure: LEFT  HEART CATH AND CORONARY ANGIOGRAPHY;  Surgeon: Laverda Page, MD;  Location: [facility] C[facility] LAB;  Service: Cardiovascular;;  . COLONOSCOPY W/ POLYPECTOMY    . COLONOSCOPY WITH PROPOFOL N/A 05/25/2020   Procedure: COLONOSCOPY WITH PROPOFOL;  Surgeon: Gatha Mayer, MD;  Location: [facility];  Service: Endoscopy;  Laterality: N/A;  . EMBOLECTOMY Right 09/12/2017   Procedure: Thrombectomy  & Redo Right Below Knee Popliteal Artery Bypass Graft  ;  Surgeon: Waynetta Sandy, MD;  Location: [place];  Service: Vascular;  Laterality: Right;  . ENTEROSCOPY N/A 07/20/2020   Procedure: ENTEROSCOPY;  Surgeon: Mansouraty, Telford Nab., MD;  Location: [place];  Service: Gastroenterology;  Laterality: N/A;  . ESOPHAGOGASTRODUODENOSCOPY (EGD) WITH PROPOFOL N/A 05/24/2020   Procedure: ESOPHAGOGASTRODUODENOSCOPY (EGD) WITH PROPOFOL;  Surgeon: Ladene Artist, MD;  Location: [facility];  Service: Endoscopy;  Laterality: N/A;  . EYE SURGERY Bilateral    cataract  . FEMORAL-TIBIAL BYPASS GRAFT Left 10/27/2013   Procedure: BYPASS GRAFT LEFT FEMORAL- POSTERIOR TIBIAL ARTERY;  Surgeon: Angelia Mould, MD;  Location: [place];  Service: Vascular;  Laterality: Left;  . FEMORAL-TIBIAL BYPASS GRAFT Right 09/10/2017   Procedure: RIGHT SUPERFICIAL  FEMORAL ARTERY-BELOW KNEE POPLITEAL ARTERY BYPASS GRAFT WITH VEIN;  Surgeon: Angelia Mould, MD;  Location: [place];  Service: Vascular;  Laterality: Right;  . GIVENS CAPSULE STUDY N/A 07/20/2020   Procedure: GIVENS CAPSULE STUDY;  Surgeon: Irving Copas., MD;  Location: [place];  Service: Gastroenterology;  Laterality: N/A;  capsule placed through scope into duodenum @ 0936  . I & D EXTREMITY Left 10/27/2013   Procedure: IRRIGATION AND DEBRIDEMENT EXTREMITY- LEFT FOOT;  Surgeon: Angelia Mould, MD;  Location: [place];  Service: Vascular;  Laterality: Left;  . I & D EXTREMITY Right 01/26/2020   Procedure: IRRIGATION AND DEBRIDEMENT EXTREMITY  RIGHT FOOT WOUND;  Surgeon: Angelia Mould, MD;  Location: [place];  Service: Vascular;  Laterality: Right;  . INTRAOPERATIVE ARTERIOGRAM Right 09/10/2017   Procedure: INTRA OPERATIVE ARTERIOGRAM;  Surgeon: Angelia Mould, MD;  Location: [place];  Service: Vascular;  Laterality: Right;  . IR ANGIOGRAM FOLLOW UP STUDY  09/09/2017  . IR FLUORO GUIDE CV LINE RIGHT  05/31/2020  . IR US GUIDE VASC ACCESS RIGHT  06/01/2020  . LEFT HEART CATH AND CORONARY ANGIOGRAPHY N/A 08/19/2020   Procedure: LEFT HEART CATH AND CORONARY ANGIOGRAPHY;  Surgeon: Adrian Prows, MD;  Location: [place] [facility];  Service: Cardiovascular;  Laterality: N/A;  . LEFT HEART CATHETERIZATION WITH CORONARY ANGIOGRAM N/A 11/09/2014   Procedure: LEFT HEART CATHETERIZATION WITH CORONARY ANGIOGRAM;  Surgeon: Laverda Page, MD;  Location: [facility] CATH LAB;  Service: Cardiovascular;  Laterality: N/A;  . LOWER EXTREMITY ANGIOGRAM Right 11/08/2015   Procedure: Lower Extremity Angiogram;  Surgeon: Serafina Mitchell, MD;  Location: [place] [facility];  Service: Cardiovascular;  Laterality: Right;  . LUMBAR LAMINECTOMY  06/13/2012   Procedure: MICRODISCECTOMY  LUMBAR LAMINECTOMY;  Surgeon: Jessy Oto, MD;  Location: [place];  Service: Orthopedics;  Laterality: N/A;  Central laminectomy L2-3, L3-4, L4-5  . PACEMAKER INSERTION  10/08/14   MDT Advisa MRI compatible dual chamber pacemaker implanted by Dr Caryl Comes for syncope with post-termination pauses  . PERCUTANEOUS CORONARY STENT INTERVENTION (PCI-S)  11/09/2014   des to lad & distal circumflex         Dr  [name]  . PERIPHERAL VASCULAR BALLOON ANGIOPLASTY Right 11/11/2019   Procedure: PERIPHERAL VASCULAR BALLOON ANGIOPLASTY;  Surgeon: Marty Heck, MD;  Location: [place] [facility];  Service: Cardiovascular;  Laterality: Right;  below knee popliteal, tibioperoneal trunk, posterior tibial arteries  . PERIPHERAL VASCULAR CATHETERIZATION N/A 11/08/2015   Procedure: Abdominal Aortogram;   Surgeon: Serafina Mitchell, MD;  Location: [place] [facility];  Service: Cardiovascular;  Laterality: N/A;  . PERIPHERAL VASCULAR CATHETERIZATION Right 11/08/2015   Procedure: Peripheral Vascular Atherectomy;  Surgeon: Serafina Mitchell, MD;  Location: [place] [facility];  Service: Cardiovascular;  Laterality: Right;  . PERIPHERAL VASCULAR CATHETERIZATION N/A 10/10/2016   Procedure: Lower Extremity Angiography;  Surgeon: Waynetta Sandy, MD;  Location: [place] [facility];  Service: Cardiovascular;  Laterality: N/A;  . PERIPHERAL VASCULAR CATHETERIZATION Right 10/10/2016   Procedure: Peripheral Vascular Atherectomy;  Surgeon: Waynetta Sandy, MD;  Location: [place] [facility];  Service: Cardiovascular;  Laterality: Right;  Popliteal  . PERMANENT PACEMAKER INSERTION N/A 10/08/2014   Procedure: PERMANENT PACEMAKER INSERTION;  Surgeon: Deboraha Sprang, MD;  Location: [facility] CATH LAB;  Service: Cardiovascular;  Laterality: N/A;  . SUBMUCOSAL TATTOO INJECTION  07/20/2020   Procedure: SUBMUCOSAL TATTOO INJECTION;  Surgeon: Irving Copas., MD;  Location: [place];  Service: Gastroenterology;;  . TEMPORARY PACEMAKER INSERTION Bilateral 10/07/2014   Procedure: TEMPORARY PACEMAKER INSERTION;  Surgeon: Laverda Page, MD;  Location: [facility] CATH LAB;  Service: Cardiovascular;  Laterality: Bilateral;  . WOUND DEBRIDEMENT Right 02/19/2020   Procedure: DEBRIDEMENT WOUND RIGHT FOOT;  Surgeon: Angelia Mould, MD;  Location: [place];  Service: Vascular;  Laterality: Right;    There were no vitals filed for this visit.   Subjective Assessment - 03/22/21 0900    Subjective He will get forearm crutches at end of month.    Pertinent History Rt TTA, artthritis, A-Fib, cardiac syncope, cataract, CKD, CAD, DM, ESRD w/HD since 7/21, HTN, PVD, cardiac pacemaker,    Limitations Walking;House hold activities    Patient Stated Goals to use prosthesis to walk in community, to hunt & fish, yardwork &  garden    Currently in Pain? No/denies              [facility] PT Assessment - 03/22/21 0859      Assessment   Medical Diagnosis Right Transtibial Amputation    Referring Provider (PT) Meridee Score, MD      Berg Balance Test   Sit to Stand Needs minimal aid to stand or to stabilize   with RW = 3   Standing Unsupported Able to stand 2 minutes with supervision   with RW = 4   Sitting with Back Unsupported but Feet Supported on Floor or Stool Able to sit safely and securely 2 minutes    Stand to Sit Uses backs of legs against chair to control descent   with RW = 3   Transfers Able to transfer safely, definite need of hands    Standing Unsupported with Eyes Closed Unable to keep eyes closed 3  seconds but stays steady   with RW = 4   Standing Unsupported with Feet Together Needs help to attain position and unable to hold for 15 seconds   with RW = 4   From Standing, Reach Forward with Outstretched Arm Loses balance while trying/requires external support   with RW = 4   From Standing Position, Pick up Object from Floor Unable to try/needs assist to keep balance   with RW = 4   From Standing Position, Turn to Look Behind Over each Shoulder Needs assist to keep from losing balance and falling   with RW = 4   Turn 360 Degrees Needs assistance while turning   with RW = 2   Standing Unsupported, Alternately Place Feet on Step/Stool Needs assistance to keep from falling or unable to try   with RW = 4   Standing Unsupported, One Foot in [place] balance while stepping or standing   with RW = 3   Standing on One Leg Unable to try or needs assist to prevent fall   with RW = 4   Total Score 14    Berg comment: with RW tasks of Berg 49/56,   on 01/04/2021 without RW support was 9/56 & with RW 28/56          Prosthetics Assessment - 03/22/21 [place] with Skin check;Residual limb care;Care of non-amputated limb;Prosthetic cleaning;Ply sock cleaning;Correct  ply sock adjustment;Proper wear schedule/adjustment;Proper weight-bearing schedule/adjustment                        [facility] Adult PT Treatment/Exercise - 03/22/21 0859      Transfers   Transfers Sit to Stand;Stand to Sit    Sit to Stand 6: Modified independent (Device/Increase time);With upper extremity assist;With armrests;From chair/3-in-1;Other (comment)   to RW   Sit to Stand Details --    Stand to Sit 6: Modified independent (Device/Increase time);With upper extremity assist;With armrests;To chair/3-in-1;Other (comment)   from RW   Stand to Sit Details (indicate cue type and reason) --      Ambulation/Gait   Ambulation/Gait Yes    Ambulation/Gait Assistance 6: Modified independent (Device/Increase time)   RW mod ind   Ambulation Distance (Feet) 300 Feet    Assistive device Prosthesis;Rolling walker    Ramp 6: Modified independent (Device)   RW & prosthesis   Curb 6: Modified independent (Device/increase time)   RW & prosthesis     Neuro Re-ed    Neuro Re-ed Details  --      Exercises   Exercises --      Prosthetics   Current prosthetic wear tolerance (days/week)  daily    Current prosthetic wear tolerance (#hours/day)  most of awake hours include to dialysis    Current prosthetic weight-bearing tolerance (hours/day)  Patient tolerated standing & gait with partial weight on prosthesis for 5 minutes with report of vascular pain in bil. calves limiting time.    Residual limb condition  No open areas, normal color, tempertaure & moisture, shape cylinderical                    PT Short Term Goals - 03/06/21 1609      PT SHORT TERM GOAL #1   Title Patient verbalizes proper weighing & prosthetic issues with dialysis.    Time 1    Period Months    Status Achieved  Target Date 03/03/21      PT SHORT TERM GOAL #2   Title Pateint tolerates prosthesis wear >90% of awake hours / day with no skin issues.    Time 1    Period Months    Status Achieved     Target Date 03/03/21      PT SHORT TERM GOAL #3   Title Pateint is able to reach 10" anteriorly & pick up item from floor with RW safely.    Time 1    Period Months    Status Achieved    Target Date 03/03/21      PT SHORT TERM GOAL #4   Title Pateint ambulates 300' with RW & prosthesis modified independent.    Time 1    Period Months    Status Achieved    Target Date 03/03/21      PT SHORT TERM GOAL #5   Title Patient negotiates ramps & curbs with RW & prosthesis with supervision    Time 1    Period Months    Status Achieved    Target Date 03/03/21      PT SHORT TERM GOAL #6   Title Patient ambulates 50' with cane & prosthesis with minA    Time 4    Period Weeks    Status Achieved    Target Date 03/03/21             PT Long Term Goals - 03/22/21 1330      PT LONG TERM GOAL #1   Title Patient verbalizes & demonstrates understanding of prosthetic care to enable safe utilization of prosthesis.    Time 3    Period Months    Status Achieved      PT LONG TERM GOAL #2   Title Patient tolerates wear of prosthesis >90% of awake hours without skin or limb pain issues to enable function throughout his day.    Time 3    Period Months    Status Achieved      PT LONG TERM GOAL #3   Title Task of Berg Balance with RW  >/= 45/56    Time 3    Period Months    Status Achieved      PT LONG TERM GOAL #4   Title Patient ambulates >400' with LRAD & prosthesis modified independent.    Baseline 03/22/2021 pt limited to distance of 300' by vascular pain    Time 3    Period Months    Status Partially Met      PT LONG TERM GOAL #5   Title Patient negotiates ramps, curbs & stairs with LRAD & prosthesis modified independent.    Time 3    Period Months    Status Achieved                 Plan - 03/22/21 1331    Clinical Impression Statement Patient met or partially met all LTGs with walker or forearm crutch support. He is limited in distance of gait & standing tolerance  by vascular pain.  He verbalized understanding of PT recommendations for activity level & exercises but limited participation outside of PT.  After patient improves endurance & strength by performing activities with RW or forearm crutches then he may benefit from more skilled PT to progress to cane or less for improved mobility.    Personal Factors and Comorbidities Comorbidity 3+;Fitness;Time since onset of injury/illness/exacerbation;Past/Current Experience    Comorbidities Rt TTA, left Transmetatarsal Amputation, artthritis, A-Fib,  cardiac syncope, cataract, CKD, CAD, DM, ESRD w/HD since 7/21, HTN, PVD, cardiac pacemaker,    Examination-Activity Limitations Locomotion Level;Squat;Stairs;Stand;Toileting;Transfers    Examination-Participation Restrictions Community Activity    Stability/Clinical Decision Making Evolving/Moderate complexity    Rehab Potential Good    PT Frequency 2x / week    PT Duration 12 weeks    PT Treatment/Interventions ADLs/Self Care Home Management;DME Instruction;Gait training;Stair training;Functional mobility training;Therapeutic activities;Therapeutic exercise;Balance training;Neuromuscular re-education;Patient/family education;Prosthetic Training;Vestibular    PT Next Visit Plan discharge PT    Consulted and Agree with Plan of Care Patient           Patient will benefit from skilled therapeutic intervention in order to improve the following deficits and impairments:  Abnormal gait,Cardiopulmonary status limiting activity,Decreased activity tolerance,Decreased balance,Decreased endurance,Decreased knowledge of use of DME,Decreased mobility,Decreased scar mobility,Decreased strength,Increased edema,Impaired flexibility,Postural dysfunction,Prosthetic Dependency,Obesity  Visit Diagnosis: Unsteadiness on feet  Abnormal posture  Foot drop, left  Other abnormalities of gait and mobility     Problem List Patient Active Problem List   Diagnosis Date Noted  .  NSVT (nonsustained ventricular tachycardia) ([place])   . Pancytopenia ([place])   . Acute blood loss anemia 07/18/2020  . Hemodialysis patient ([place]) 06/04/2020  . Necrotizing glomerulonephritis 06/04/2020  . Heme positive stool   . Melena   . Upper GI bleeding 05/23/2020  . Acute GI bleeding 05/23/2020  . Acute hematogenous osteomyelitis of right foot ([place]) 03/11/2020  . Gangrene of right foot ([place])   . Diabetic foot ulcer associated with type 2 diabetes mellitus ([place]) 02/19/2020  . Heel ulcer ([place]) 11/05/2019  . Atherosclerotic PVD with ulceration ([place]) 09/09/2017  . Essential hypertension, benign 02/07/2015  . Renal insufficiency 02/07/2015  . Post PTCA 11/09/2014  . S/P PTCA (percutaneous transluminal coronary angioplasty) 11/09/2014  . Pacemaker: Nitro DR MRI [id number] Dual chamber pacemaker 10/08/2014 10/08/2014  . Encounter for care of pacemaker 10/08/2014  . Sinus node dysfunction ([place]) 10/08/2014  . Cardiac asystole ([place]) 10/07/2014  . Cardiac syncope 10/07/2014  . Diabetes mellitus with stage 3 chronic kidney disease ([place]) 10/07/2014  . CAD (coronary artery disease), native coronary artery 10/07/2014  . Diabetes mellitus due to underlying condition without complications ([place]) 91/66/0600  . Anemia, unspecified 05/25/2014  . Syncope 04/30/2014  . Atherosclerosis of native arteries of the extremities with gangrene ([place]) 12/16/2013  . Volume overload 10/30/2013  . Acute renal failure ([place]) 10/28/2013  . PAD (peripheral artery disease) ([place]) 10/27/2013  . Leukocytosis 10/21/2013  . Anemia 10/21/2013  . Osteomyelitis of left foot ([place]) 08/19/2013  . Spinal stenosis, lumbar region, with neurogenic claudication 06/12/2012    Class: Diagnosis of  . DIABETES MELLITUS, TYPE II 03/15/2009  . ALCOHOL ABUSE 03/15/2009  . TOBACCO USE 03/15/2009  . HYPERTENSION 03/15/2009    Jamey Reas, PT, DPT 03/22/2021, 1:34 PM  [facility] Physical Therapy [address] [place], [place], [address] Phone: [phone]   Fax:  [phone]  Name: LEXTON HIDALGO MRN: [id number] Date of Birth: July 14, 1961

## 2021-03-23 ENCOUNTER — Encounter (HOSPITAL_COMMUNITY): Payer: Self-pay | Admitting: Vascular Surgery

## 2021-03-23 DIAGNOSIS — E0821 Diabetes mellitus due to underlying condition with diabetic nephropathy: Secondary | ICD-10-CM | POA: Diagnosis not present

## 2021-03-23 DIAGNOSIS — N2581 Secondary hyperparathyroidism of renal origin: Secondary | ICD-10-CM | POA: Diagnosis not present

## 2021-03-23 DIAGNOSIS — T8249XA Other complication of vascular dialysis catheter, initial encounter: Secondary | ICD-10-CM | POA: Diagnosis not present

## 2021-03-23 DIAGNOSIS — Z992 Dependence on renal dialysis: Secondary | ICD-10-CM | POA: Diagnosis not present

## 2021-03-23 DIAGNOSIS — N186 End stage renal disease: Secondary | ICD-10-CM | POA: Diagnosis not present

## 2021-03-23 DIAGNOSIS — D631 Anemia in chronic kidney disease: Secondary | ICD-10-CM | POA: Diagnosis not present

## 2021-03-23 DIAGNOSIS — D689 Coagulation defect, unspecified: Secondary | ICD-10-CM | POA: Diagnosis not present

## 2021-03-23 NOTE — Progress Notes (Signed)
Device Clinic and Gae Dry at [facility] notified of surgery.  Patient has PPM.

## 2021-03-24 ENCOUNTER — Ambulatory Visit: Payer: Medicare Other

## 2021-03-24 ENCOUNTER — Encounter (HOSPITAL_COMMUNITY): Payer: Self-pay | Admitting: Vascular Surgery

## 2021-03-24 ENCOUNTER — Other Ambulatory Visit (HOSPITAL_COMMUNITY)
Admission: RE | Admit: 2021-03-24 | Discharge: 2021-03-24 | Disposition: A | Discharge status: Home / Self Care | Payer: Medicare Other | Source: Ambulatory Visit | Attending: Vascular Surgery | Admitting: Vascular Surgery

## 2021-03-24 ENCOUNTER — Other Ambulatory Visit (HOSPITAL_COMMUNITY): Payer: Medicare Other

## 2021-03-24 ENCOUNTER — Other Ambulatory Visit: Payer: Self-pay

## 2021-03-24 ENCOUNTER — Encounter (HOSPITAL_COMMUNITY): Payer: Medicare Other

## 2021-03-24 DIAGNOSIS — Z20822 Contact with and (suspected) exposure to covid-19: Secondary | ICD-10-CM | POA: Insufficient documentation

## 2021-03-24 DIAGNOSIS — Z01812 Encounter for preprocedural laboratory examination: Secondary | ICD-10-CM | POA: Diagnosis not present

## 2021-03-24 MED ORDER — VANCOMYCIN HCL 1500 MG/300ML IV SOLN
1500.0000 mg | INTRAVENOUS | Status: AC
  Administered 2021-03-27: 1500 mg via INTRAVENOUS
  Filled 2021-03-24: qty 300

## 2021-03-24 NOTE — Progress Notes (Addendum)
[name] denies chest pain or shortness of breath. Patient was tested for Covid and has been in quarantine since that time.  Mr. Nicklaus has a pacemaker, his cardiology is Dr. [name].  I faxed periop device orders to [facility], for [facility] to fill out and return to the hospital.   Mr. Cobbins has type II diabetes. Patient reports that CBG's have been high, this am was 110, it is low some mornings, so I adjust my evening 70/30 Insulin. Mr. Juenger reports that later in the day CBG's run in the 200's and evening up to low 300's.  I instructed Mr. Joline Salt to not take any 70/30 Insulin on the am of surgery, the night before take 70 % of the dose , I told patient to take 56 units of 70/30 Insulin at the evening meal. I instructed patient to check CBG after awaking and every 2 hours until arrival  to the hospital.  I Instructed patient if CBG is less than 70 to drink  1/2 cup of a clear juice. Recheck CBG in 15 minutes if CBG is not over 70 call, pre- op desk at [phone] for further instructions.

## 2021-03-25 DIAGNOSIS — D631 Anemia in chronic kidney disease: Secondary | ICD-10-CM | POA: Diagnosis not present

## 2021-03-25 DIAGNOSIS — Z992 Dependence on renal dialysis: Secondary | ICD-10-CM | POA: Diagnosis not present

## 2021-03-25 DIAGNOSIS — N186 End stage renal disease: Secondary | ICD-10-CM | POA: Diagnosis not present

## 2021-03-25 DIAGNOSIS — N2581 Secondary hyperparathyroidism of renal origin: Secondary | ICD-10-CM | POA: Diagnosis not present

## 2021-03-25 DIAGNOSIS — D689 Coagulation defect, unspecified: Secondary | ICD-10-CM | POA: Diagnosis not present

## 2021-03-25 DIAGNOSIS — T8249XA Other complication of vascular dialysis catheter, initial encounter: Secondary | ICD-10-CM | POA: Diagnosis not present

## 2021-03-25 DIAGNOSIS — E0821 Diabetes mellitus due to underlying condition with diabetic nephropathy: Secondary | ICD-10-CM | POA: Diagnosis not present

## 2021-03-25 LAB — SARS CORONAVIRUS 2 (TAT 6-24 HRS): SARS Coronavirus 2: NEGATIVE

## 2021-03-26 NOTE — Anesthesia Preprocedure Evaluation (Addendum)
Anesthesia Evaluation  Patient identified by MRN, date of birth, ID band Patient awake    Reviewed: Allergy & Precautions, NPO status , Patient's Chart, lab work & pertinent test results  Airway Mallampati: II  TM Distance: >3 FB Neck ROM: Full    Dental  (+) Teeth Intact   Pulmonary neg pulmonary ROS, former smoker,    Pulmonary exam normal        Cardiovascular hypertension, Pt. on medications and Pt. on home beta blockers + CAD and + Peripheral Vascular Disease  + dysrhythmias Atrial Fibrillation + pacemaker  Rhythm:Regular Rate:Normal     Neuro/Psych negative neurological ROS  negative psych ROS   GI/Hepatic Neg liver ROS, GERD  Medicated,  Endo/Other  diabetes, Type 2, Insulin Dependent  Renal/GU ESRFRenal disease  negative genitourinary   Musculoskeletal  (+) Arthritis ,   Abdominal (+)  Abdomen: soft. Bowel sounds: normal.  Peds  Hematology  (+) anemia ,   Anesthesia Other Findings   Reproductive/Obstetrics                            Anesthesia Physical Anesthesia Plan  ASA: III  Anesthesia Plan: Regional and MAC   Post-op Pain Management:    Induction: Intravenous  PONV Risk Score and Plan: Ondansetron, Aprepitant, Propofol infusion, Midazolam and Treatment may vary due to age or medical condition  Airway Management Planned: Simple Face Mask, Natural Airway and Nasal Cannula  Additional Equipment: None  Intra-op Plan:   Post-operative Plan:   Informed Consent: I have reviewed the patients History and Physical, chart, labs and discussed the procedure including the risks, benefits and alternatives for the proposed anesthesia with the patient or authorized representative who has indicated his/her understanding and acceptance.     Dental advisory given  Plan Discussed with: CRNA  Anesthesia Plan Comments: (- rep contacted pre-operatively with plans for magnet  application over device intraoperatively.  Lab Results      Component                Value               Date                      WBC                      6.4                 08/19/2020                HGB                      9.9 (L)             03/27/2021                HCT                      29.0 (L)            03/27/2021                MCV                      97.8                08/19/2020  PLT                      129 (L)             08/19/2020           Lab Results      Component                Value               Date                      NA                       134 (L)             03/27/2021                K                        4.2                 03/27/2021                CO2                      30                  08/19/2020                GLUCOSE                  225 (H)             03/27/2021                BUN                      54 (H)              03/27/2021                CREATININE               6.50 (H)            03/27/2021                CALCIUM                  8.9                 08/19/2020                GFRNONAA                 22 (L)              08/19/2020                GFRAA                    13 (L)              07/21/2020          )      Anesthesia Quick Evaluation

## 2021-03-27 ENCOUNTER — Ambulatory Visit (HOSPITAL_COMMUNITY): Payer: Medicare Other | Admitting: Anesthesiology

## 2021-03-27 ENCOUNTER — Encounter (HOSPITAL_COMMUNITY)
Admission: RE | Disposition: A | Discharge status: Home / Self Care | Payer: Self-pay | Source: Ambulatory Visit | Attending: Vascular Surgery

## 2021-03-27 ENCOUNTER — Ambulatory Visit (HOSPITAL_COMMUNITY)
Admission: RE | Admit: 2021-03-27 | Discharge: 2021-03-27 | Disposition: A | Discharge status: Home / Self Care | Payer: Medicare Other | Source: Ambulatory Visit | Attending: Vascular Surgery | Admitting: Vascular Surgery

## 2021-03-27 ENCOUNTER — Encounter: Payer: Medicare Other | Admitting: Physical Therapy

## 2021-03-27 ENCOUNTER — Encounter (HOSPITAL_COMMUNITY): Payer: Self-pay | Admitting: Vascular Surgery

## 2021-03-27 DIAGNOSIS — Z89511 Acquired absence of right leg below knee: Secondary | ICD-10-CM | POA: Diagnosis not present

## 2021-03-27 DIAGNOSIS — E1122 Type 2 diabetes mellitus with diabetic chronic kidney disease: Secondary | ICD-10-CM | POA: Insufficient documentation

## 2021-03-27 DIAGNOSIS — Z794 Long term (current) use of insulin: Secondary | ICD-10-CM | POA: Insufficient documentation

## 2021-03-27 DIAGNOSIS — N179 Acute kidney failure, unspecified: Secondary | ICD-10-CM | POA: Diagnosis not present

## 2021-03-27 DIAGNOSIS — Z950 Presence of cardiac pacemaker: Secondary | ICD-10-CM | POA: Diagnosis not present

## 2021-03-27 DIAGNOSIS — N186 End stage renal disease: Secondary | ICD-10-CM | POA: Insufficient documentation

## 2021-03-27 DIAGNOSIS — Z87891 Personal history of nicotine dependence: Secondary | ICD-10-CM | POA: Insufficient documentation

## 2021-03-27 DIAGNOSIS — Z79899 Other long term (current) drug therapy: Secondary | ICD-10-CM | POA: Insufficient documentation

## 2021-03-27 DIAGNOSIS — N185 Chronic kidney disease, stage 5: Secondary | ICD-10-CM | POA: Diagnosis not present

## 2021-03-27 DIAGNOSIS — Z992 Dependence on renal dialysis: Secondary | ICD-10-CM | POA: Insufficient documentation

## 2021-03-27 DIAGNOSIS — Z882 Allergy status to sulfonamides status: Secondary | ICD-10-CM | POA: Diagnosis not present

## 2021-03-27 DIAGNOSIS — I120 Hypertensive chronic kidney disease with stage 5 chronic kidney disease or end stage renal disease: Secondary | ICD-10-CM | POA: Diagnosis not present

## 2021-03-27 DIAGNOSIS — Z888 Allergy status to other drugs, medicaments and biological substances status: Secondary | ICD-10-CM | POA: Diagnosis not present

## 2021-03-27 DIAGNOSIS — Z7901 Long term (current) use of anticoagulants: Secondary | ICD-10-CM | POA: Insufficient documentation

## 2021-03-27 DIAGNOSIS — I2510 Atherosclerotic heart disease of native coronary artery without angina pectoris: Secondary | ICD-10-CM | POA: Diagnosis not present

## 2021-03-27 DIAGNOSIS — Z881 Allergy status to other antibiotic agents status: Secondary | ICD-10-CM | POA: Insufficient documentation

## 2021-03-27 DIAGNOSIS — E1151 Type 2 diabetes mellitus with diabetic peripheral angiopathy without gangrene: Secondary | ICD-10-CM | POA: Insufficient documentation

## 2021-03-27 HISTORY — PX: AV FISTULA PLACEMENT: SHX1204

## 2021-03-27 HISTORY — DX: End stage renal disease: N18.6

## 2021-03-27 LAB — POCT I-STAT, CHEM 8
BUN: 54 mg/dL — ABNORMAL HIGH (ref 8–23)
Calcium, Ion: 1.1 mmol/L — ABNORMAL LOW (ref 1.15–1.40)
Chloride: 95 mmol/L — ABNORMAL LOW (ref 98–111)
Creatinine, Ser: 6.5 mg/dL — ABNORMAL HIGH (ref 0.61–1.24)
Glucose, Bld: 225 mg/dL — ABNORMAL HIGH (ref 70–99)
HCT: 29 % — ABNORMAL LOW (ref 39.0–52.0)
Hemoglobin: 9.9 g/dL — ABNORMAL LOW (ref 13.0–17.0)
Potassium: 4.2 mmol/L (ref 3.5–5.1)
Sodium: 134 mmol/L — ABNORMAL LOW (ref 135–145)
TCO2: 27 mmol/L (ref 22–32)

## 2021-03-27 LAB — GLUCOSE, CAPILLARY
Glucose-Capillary: 230 mg/dL — ABNORMAL HIGH (ref 70–99)
Glucose-Capillary: 251 mg/dL — ABNORMAL HIGH (ref 70–99)

## 2021-03-27 SURGERY — ARTERIOVENOUS (AV) FISTULA CREATION
Anesthesia: Monitor Anesthesia Care | Site: Arm Upper | Laterality: Right

## 2021-03-27 MED ORDER — HEPARIN SODIUM (PORCINE) 1000 UNIT/ML IJ SOLN
INTRAMUSCULAR | Status: AC
  Filled 2021-03-27: qty 1

## 2021-03-27 MED ORDER — INSULIN ASPART 100 UNIT/ML IJ SOLN
8.0000 [IU] | Freq: Once | INTRAMUSCULAR | Status: AC
  Administered 2021-03-27: 8 [IU] via SUBCUTANEOUS

## 2021-03-27 MED ORDER — LIDOCAINE HCL (PF) 1 % IJ SOLN
INTRAMUSCULAR | Status: DC | PRN
  Administered 2021-03-27: 10 mL

## 2021-03-27 MED ORDER — FENTANYL CITRATE (PF) 250 MCG/5ML IJ SOLN
INTRAMUSCULAR | Status: AC
  Filled 2021-03-27: qty 5

## 2021-03-27 MED ORDER — CHLORHEXIDINE GLUCONATE 0.12 % MT SOLN
OROMUCOSAL | Status: AC
  Filled 2021-03-27: qty 15

## 2021-03-27 MED ORDER — LIDOCAINE-EPINEPHRINE 1 %-1:100000 IJ SOLN
INTRAMUSCULAR | Status: DC | PRN
  Administered 2021-03-27: 20 mL

## 2021-03-27 MED ORDER — PROPOFOL 1000 MG/100ML IV EMUL
INTRAVENOUS | Status: AC
  Filled 2021-03-27: qty 100

## 2021-03-27 MED ORDER — LIDOCAINE-EPINEPHRINE (PF) 1.5 %-1:200000 IJ SOLN
INTRAMUSCULAR | Status: DC | PRN
  Administered 2021-03-27: 30 mL via PERINEURAL

## 2021-03-27 MED ORDER — CHLORHEXIDINE GLUCONATE 4 % EX LIQD: 60.0000 mL | Freq: Once | CUTANEOUS | Status: DC

## 2021-03-27 MED ORDER — INSULIN ASPART 100 UNIT/ML IJ SOLN
INTRAMUSCULAR | Status: AC
  Filled 2021-03-27: qty 1

## 2021-03-27 MED ORDER — SODIUM CHLORIDE 0.9 % IV SOLN: INTRAVENOUS | Status: DC | PRN

## 2021-03-27 MED ORDER — ACETAMINOPHEN 10 MG/ML IV SOLN: 1000.0000 mg | Freq: Once | INTRAVENOUS | Status: DC | PRN

## 2021-03-27 MED ORDER — LIDOCAINE 2% (20 MG/ML) 5 ML SYRINGE
INTRAMUSCULAR | Status: DC | PRN
  Administered 2021-03-27: 40 mg via INTRAVENOUS

## 2021-03-27 MED ORDER — PROMETHAZINE HCL 25 MG/ML IJ SOLN: 6.2500 mg | INTRAMUSCULAR | Status: DC | PRN

## 2021-03-27 MED ORDER — LIDOCAINE-EPINEPHRINE 1 %-1:100000 IJ SOLN
INTRAMUSCULAR | Status: AC
  Filled 2021-03-27: qty 1

## 2021-03-27 MED ORDER — PROPOFOL 10 MG/ML IV BOLUS
INTRAVENOUS | Status: AC
  Filled 2021-03-27: qty 20

## 2021-03-27 MED ORDER — SODIUM CHLORIDE 0.9 % IV SOLN
INTRAVENOUS | Status: AC
  Filled 2021-03-27: qty 1.2

## 2021-03-27 MED ORDER — PROTAMINE SULFATE 10 MG/ML IV SOLN
INTRAVENOUS | Status: AC
  Filled 2021-03-27: qty 5

## 2021-03-27 MED ORDER — MIDAZOLAM HCL 2 MG/2ML IJ SOLN
INTRAMUSCULAR | Status: AC
  Filled 2021-03-27: qty 2

## 2021-03-27 MED ORDER — LIDOCAINE HCL (PF) 1 % IJ SOLN
INTRAMUSCULAR | Status: AC
  Filled 2021-03-27: qty 30

## 2021-03-27 MED ORDER — PROTAMINE SULFATE 10 MG/ML IV SOLN
INTRAVENOUS | Status: DC | PRN
  Administered 2021-03-27: 50 mg via INTRAVENOUS

## 2021-03-27 MED ORDER — PROPOFOL 500 MG/50ML IV EMUL
INTRAVENOUS | Status: DC | PRN
  Administered 2021-03-27: 25 ug/kg/min via INTRAVENOUS
  Administered 2021-03-27: 50 ug/kg/min via INTRAVENOUS

## 2021-03-27 MED ORDER — VANCOMYCIN HCL IN DEXTROSE 1-5 GM/200ML-% IV SOLN
INTRAVENOUS | Status: AC
  Filled 2021-03-27: qty 200

## 2021-03-27 MED ORDER — MIDAZOLAM HCL 5 MG/5ML IJ SOLN
INTRAMUSCULAR | Status: DC | PRN
  Administered 2021-03-27: 2 mg via INTRAVENOUS

## 2021-03-27 MED ORDER — ONDANSETRON HCL 4 MG/2ML IJ SOLN
INTRAMUSCULAR | Status: AC
  Filled 2021-03-27: qty 2

## 2021-03-27 MED ORDER — OXYCODONE HCL 5 MG PO TABS: 5.0000 mg | ORAL_TABLET | ORAL | 0 refills | Status: DC | PRN

## 2021-03-27 MED ORDER — PROPOFOL 10 MG/ML IV BOLUS
INTRAVENOUS | Status: DC | PRN
  Administered 2021-03-27: 50 mg via INTRAVENOUS
  Administered 2021-03-27: 30 mg via INTRAVENOUS
  Administered 2021-03-27: 50 mg via INTRAVENOUS

## 2021-03-27 MED ORDER — PROTAMINE SULFATE 10 MG/ML IV SOLN
INTRAVENOUS | Status: AC
  Filled 2021-03-27: qty 25

## 2021-03-27 MED ORDER — SODIUM CHLORIDE 0.9 % IV SOLN: INTRAVENOUS | Status: DC

## 2021-03-27 MED ORDER — FENTANYL CITRATE (PF) 100 MCG/2ML IJ SOLN: 25.0000 ug | INTRAMUSCULAR | Status: DC | PRN

## 2021-03-27 MED ORDER — HEPARIN SODIUM (PORCINE) 1000 UNIT/ML IJ SOLN
INTRAMUSCULAR | Status: DC | PRN
  Administered 2021-03-27: 10000 [IU] via INTRAVENOUS

## 2021-03-27 MED ORDER — LIDOCAINE 2% (20 MG/ML) 5 ML SYRINGE
INTRAMUSCULAR | Status: AC
  Filled 2021-03-27: qty 5

## 2021-03-27 MED ORDER — 0.9 % SODIUM CHLORIDE (POUR BTL) OPTIME
TOPICAL | Status: DC | PRN
  Administered 2021-03-27: 1000 mL

## 2021-03-27 MED ORDER — PHENYLEPHRINE HCL-NACL 10-0.9 MG/250ML-% IV SOLN
INTRAVENOUS | Status: AC
  Filled 2021-03-27: qty 250

## 2021-03-27 SURGICAL SUPPLY — 36 items
ADH SKN CLS APL DERMABOND .7 (GAUZE/BANDAGES/DRESSINGS) ×1
ARMBAND PINK RESTRICT EXTREMIT (MISCELLANEOUS) ×4 IMPLANT
CANISTER SUCT 3000ML PPV (MISCELLANEOUS) ×2 IMPLANT
CANNULA VESSEL 3MM 2 BLNT TIP (CANNULA) ×2 IMPLANT
CLIP VESOCCLUDE MED 6/CT (CLIP) ×2 IMPLANT
CLIP VESOCCLUDE SM WIDE 6/CT (CLIP) ×4 IMPLANT
COVER PROBE W GEL 5X96 (DRAPES) IMPLANT
COVER WAND RF STERILE (DRAPES) ×2 IMPLANT
DECANTER SPIKE VIAL GLASS SM (MISCELLANEOUS) ×6 IMPLANT
DERMABOND ADVANCED (GAUZE/BANDAGES/DRESSINGS) ×1
DERMABOND ADVANCED .7 DNX12 (GAUZE/BANDAGES/DRESSINGS) ×1 IMPLANT
ELECT REM PT RETURN 9FT ADLT (ELECTROSURGICAL) ×2
ELECTRODE REM PT RTRN 9FT ADLT (ELECTROSURGICAL) ×1 IMPLANT
GLOVE BIO SURGEON STRL SZ7.5 (GLOVE) ×2 IMPLANT
GLOVE SRG 8 PF TXTR STRL LF DI (GLOVE) ×1 IMPLANT
GLOVE SURG ENC MOIS LTX SZ6.5 (GLOVE) ×6 IMPLANT
GLOVE SURG ENC MOIS LTX SZ7 (GLOVE) ×2 IMPLANT
GLOVE SURG UNDER POLY LF SZ6.5 (GLOVE) ×2 IMPLANT
GLOVE SURG UNDER POLY LF SZ8 (GLOVE) ×2
GOWN STRL REUS W/ TWL LRG LVL3 (GOWN DISPOSABLE) ×3 IMPLANT
GOWN STRL REUS W/TWL LRG LVL3 (GOWN DISPOSABLE) ×6
KIT BASIN OR (CUSTOM PROCEDURE TRAY) ×2 IMPLANT
KIT TURNOVER KIT B (KITS) ×2 IMPLANT
NS IRRIG 1000ML POUR BTL (IV SOLUTION) ×2 IMPLANT
PACK CV ACCESS (CUSTOM PROCEDURE TRAY) ×2 IMPLANT
PAD ARMBOARD 7.5X6 YLW CONV (MISCELLANEOUS) ×4 IMPLANT
SPONGE SURGIFOAM ABS GEL 100 (HEMOSTASIS) IMPLANT
SUT MNCRL AB 4-0 PS2 18 (SUTURE) ×4 IMPLANT
SUT PROLENE 6 0 BV (SUTURE) ×4 IMPLANT
SUT SILK 3 0 (SUTURE) ×2
SUT SILK 3-0 18XBRD TIE 12 (SUTURE) ×1 IMPLANT
SUT VIC AB 3-0 SH 27 (SUTURE) ×4
SUT VIC AB 3-0 SH 27X BRD (SUTURE) ×2 IMPLANT
TOWEL GREEN STERILE (TOWEL DISPOSABLE) ×2 IMPLANT
UNDERPAD 30X36 HEAVY ABSORB (UNDERPADS AND DIAPERS) ×2 IMPLANT
WATER STERILE IRR 1000ML POUR (IV SOLUTION) ×2 IMPLANT

## 2021-03-27 NOTE — Interval H&P Note (Signed)
History and Physical Interval Note:  03/27/2021 6:50 AM  Alexander [name]  has presented today for surgery, with the diagnosis of ESRD.  The various methods of treatment have been discussed with the patient and family. After consideration of risks, benefits and other options for treatment, the patient has consented to  Procedure(s): RIGHT ARM ARTERIOVENOUS (AV) FISTULA CREATION VERSUS GRAFT PLACEMENT (Right) as a surgical intervention.  The patient's history has been reviewed, patient examined, no change in status, stable for surgery.  I have reviewed the patient's chart and labs.  Questions were answered to the patient's satisfaction.     [name]

## 2021-03-27 NOTE — Telephone Encounter (Signed)
I let him know. Thank you

## 2021-03-27 NOTE — Transfer of Care (Signed)
Immediate Anesthesia Transfer of Care Note  Patient: Alexander [name]  Procedure(s) Performed: RIGHT ARM ARTERIOVENOUS (AV) FISTULA (Right Arm Upper)  Patient Location: PACU  Anesthesia Type:MAC and Regional  Level of Consciousness: awake, patient cooperative and responds to stimulation  Airway & Oxygen Therapy: Patient Spontanous Breathing and Patient connected to nasal cannula oxygen  Post-op Assessment: Report given to RN and Post -op Vital signs reviewed and stable  Post vital signs: Reviewed and stable  Last Vitals:  Vitals Value Taken Time  BP    Temp    Pulse    Resp    SpO2      Last Pain:  Vitals:   03/27/21 0706  TempSrc:   PainSc: 0-No pain         Complications: No complications documented.

## 2021-03-27 NOTE — Progress Notes (Addendum)
Spoke to [place] with [employer] to have representative available.  Ronalee Belts from [place] called back @ [phone].  Stated magnet should be fine. Relayed to Dr. Rex Kras.

## 2021-03-27 NOTE — Anesthesia Postprocedure Evaluation (Signed)
Anesthesia Post Note  Patient: Alexander [name]  Procedure(s) Performed: RIGHT ARM ARTERIOVENOUS (AV) FISTULA (Right Arm Upper)     Patient location during evaluation: PACU Anesthesia Type: Regional and MAC Level of consciousness: awake and alert Pain management: pain level controlled Vital Signs Assessment: post-procedure vital signs reviewed and stable Respiratory status: spontaneous breathing, nonlabored ventilation, respiratory function stable and patient connected to nasal cannula oxygen Cardiovascular status: stable and blood pressure returned to baseline Postop Assessment: no apparent nausea or vomiting Anesthetic complications: no   No complications documented.  Last Vitals:  Vitals:   03/27/21 0948 03/27/21 1017  BP: 128/61   Pulse: 84   Resp: 15   Temp: (!) 36.1 C (!) 36.1 C  SpO2: 98%     Last Pain:  Vitals:   03/27/21 0948  TempSrc:   PainSc: 0-No pain                 March Rummage Lovelace Cerveny

## 2021-03-27 NOTE — Op Note (Signed)
    NAME: BUCKLEY BRADLY    MRN: [id number] DOB: 03/28/1961    DATE OF OPERATION: 03/27/2021  PREOP DIAGNOSIS:    End-stage renal disease  POSTOP DIAGNOSIS:    Same  PROCEDURE:    Right brachiocephalic AV fistula  SURGEON: Judeth Cornfield. Scot Dock, MD  ASSIST: Laurence Slate, PA  ANESTHESIA: Axillary block  EBL: Minimal  INDICATIONS:    Alexander [name] is a 60 y.o. male who has a functioning right IJ tunneled dialysis catheter.  He has a pacemaker on the left side.  He presents for new access in the right arm.  FINDINGS:   The patient likely has a high bifurcation of the brachial artery.  I anastomose the vein to the more superficial of the 2 arteries which was larger.  The cephalic vein was 3 and half to 4 mm in diameter.  TECHNIQUE:   The patient was taken to the operating room after an axillary block was placed by anesthesia.  The right arm was prepped and draped in usual sterile fashion.  I looked at the cephalic vein with the SonoSite and I felt it was reasonable although it was fairly far lateral on the arm.  The patient appeared to have a high bifurcation of the brachial artery as there were 2 arteries noted I selected the larger of the 2 arteries which was more superficial.  The axillary block was supplemented with local.  A longitudinal incision was made over the cephalic vein above the antecubital level.  Here the vein was dissected free with branches divided between clips and 3-0 silk ties.  A separate longitudinal incision was made beneath the antecubital level to allow for further mobilization of the vein.  The vein was ligated distally and irrigated up with heparinized saline.  This marked prevent twisting.  A separate longitudinal incision was made over the artery and here the artery was dissected free.  Had some calcium but was patent with a good pulse.  It was about a 2-1/2 mm artery.  The patient was heparinized.  The vein was brought through a tunnel for anastomosis to the  radial artery.  The artery was clamped proximally distally and a longitudinal arteriotomy was made.  The vein was slightly spatulated and sewn end-to-side to the artery using continuous 6-0 Prolene suture.  At the completion there was a good thrill in the fistula.  There was a radial and ulnar signal with the Doppler.  Hemostasis was obtained in the wounds.  Each of the wounds was closed with a deep layer of 3-0 Vicryl and the skin closed with 4-0 Vicryl.  Dermabond was applied.  The patient tolerated the procedure well and was transferred to the recovery room in stable condition.  All needle and sponge counts were correct.  Given the complexity of the case a first assistant was necessary in order to expedient the procedure and safely perform the technical aspects of the operation.  [name], MD, FACS [facility]  DATE OF DICTATION:   03/27/2021

## 2021-03-27 NOTE — Anesthesia Procedure Notes (Signed)
Anesthesia Regional Block: Supraclavicular block   Pre-Anesthetic Checklist: ,, timeout performed, Correct Patient, Correct Site, Correct Laterality, Correct Procedure, Correct Position, site marked, Risks and benefits discussed,  Surgical consent,  Pre-op evaluation,  At surgeon's request and post-op pain management  Laterality: Right  Prep: Dura Prep       Needles:  Injection technique: Single-shot  Needle Type: Echogenic Stimulator Needle     Needle Length: 4cm  Needle Gauge: 20     Additional Needles:   Procedures:,,,, ultrasound used (permanent image in chart),,,,  Narrative:  Start time: 03/27/2021 7:23 AM End time: 03/27/2021 7:27 AM Injection made incrementally with aspirations every 5 mL.  Performed by: Personally  Anesthesiologist: Darral Dash, DO  Additional Notes: Patient identified. Risks/Benefits/Options discussed with patient including but not limited to bleeding, infection, nerve damage, failed block, incomplete pain control. Patient expressed understanding and wished to proceed. All questions were answered. Sterile technique was used throughout the entire procedure. Please see nursing notes for vital signs. Aspirated in 5cc intervals with injection for negative confirmation. Patient was given instructions on fall risk and not to get out of bed. All questions and concerns addressed with instructions to call with any issues or inadequate analgesia.

## 2021-03-27 NOTE — Anesthesia Procedure Notes (Signed)
Procedure Name: MAC Date/Time: 03/27/2021 8:32 AM Performed by: Michele Rockers, CRNA Pre-anesthesia Checklist: Patient identified, Emergency Drugs available, Suction available, Timeout performed and Patient being monitored Patient Re-evaluated:Patient Re-evaluated prior to induction Oxygen Delivery Method: Simple face mask

## 2021-03-28 ENCOUNTER — Other Ambulatory Visit: Payer: Self-pay

## 2021-03-28 ENCOUNTER — Encounter (HOSPITAL_COMMUNITY): Payer: Self-pay | Admitting: Vascular Surgery

## 2021-03-28 DIAGNOSIS — T8249XA Other complication of vascular dialysis catheter, initial encounter: Secondary | ICD-10-CM | POA: Diagnosis not present

## 2021-03-28 DIAGNOSIS — N2581 Secondary hyperparathyroidism of renal origin: Secondary | ICD-10-CM | POA: Diagnosis not present

## 2021-03-28 DIAGNOSIS — E0821 Diabetes mellitus due to underlying condition with diabetic nephropathy: Secondary | ICD-10-CM | POA: Diagnosis not present

## 2021-03-28 DIAGNOSIS — N186 End stage renal disease: Secondary | ICD-10-CM | POA: Diagnosis not present

## 2021-03-28 DIAGNOSIS — D631 Anemia in chronic kidney disease: Secondary | ICD-10-CM | POA: Diagnosis not present

## 2021-03-28 DIAGNOSIS — D689 Coagulation defect, unspecified: Secondary | ICD-10-CM | POA: Diagnosis not present

## 2021-03-28 DIAGNOSIS — Z992 Dependence on renal dialysis: Secondary | ICD-10-CM

## 2021-03-28 NOTE — Addendum Note (Signed)
Addendum  created 03/28/21 1401 by Josemiguel Gries, March Rummage, DO   Intraprocedure Staff edited

## 2021-03-29 ENCOUNTER — Encounter (HOSPITAL_COMMUNITY): Payer: Medicare Other

## 2021-03-29 ENCOUNTER — Encounter: Payer: Medicare Other | Admitting: Physical Therapy

## 2021-03-29 ENCOUNTER — Other Ambulatory Visit (HOSPITAL_COMMUNITY): Payer: Medicare Other

## 2021-03-29 ENCOUNTER — Ambulatory Visit: Payer: Medicare Other | Admitting: Vascular Surgery

## 2021-03-30 DIAGNOSIS — Z992 Dependence on renal dialysis: Secondary | ICD-10-CM | POA: Diagnosis not present

## 2021-03-30 DIAGNOSIS — N2581 Secondary hyperparathyroidism of renal origin: Secondary | ICD-10-CM | POA: Diagnosis not present

## 2021-03-30 DIAGNOSIS — D631 Anemia in chronic kidney disease: Secondary | ICD-10-CM | POA: Diagnosis not present

## 2021-03-30 DIAGNOSIS — T8249XA Other complication of vascular dialysis catheter, initial encounter: Secondary | ICD-10-CM | POA: Diagnosis not present

## 2021-03-30 DIAGNOSIS — N186 End stage renal disease: Secondary | ICD-10-CM | POA: Diagnosis not present

## 2021-03-30 DIAGNOSIS — D689 Coagulation defect, unspecified: Secondary | ICD-10-CM | POA: Diagnosis not present

## 2021-03-30 DIAGNOSIS — E0821 Diabetes mellitus due to underlying condition with diabetic nephropathy: Secondary | ICD-10-CM | POA: Diagnosis not present

## 2021-04-01 DIAGNOSIS — D689 Coagulation defect, unspecified: Secondary | ICD-10-CM | POA: Diagnosis not present

## 2021-04-01 DIAGNOSIS — Z992 Dependence on renal dialysis: Secondary | ICD-10-CM | POA: Diagnosis not present

## 2021-04-01 DIAGNOSIS — N2581 Secondary hyperparathyroidism of renal origin: Secondary | ICD-10-CM | POA: Diagnosis not present

## 2021-04-01 DIAGNOSIS — D631 Anemia in chronic kidney disease: Secondary | ICD-10-CM | POA: Diagnosis not present

## 2021-04-01 DIAGNOSIS — N186 End stage renal disease: Secondary | ICD-10-CM | POA: Diagnosis not present

## 2021-04-01 DIAGNOSIS — E0821 Diabetes mellitus due to underlying condition with diabetic nephropathy: Secondary | ICD-10-CM | POA: Diagnosis not present

## 2021-04-01 DIAGNOSIS — T8249XA Other complication of vascular dialysis catheter, initial encounter: Secondary | ICD-10-CM | POA: Diagnosis not present

## 2021-04-04 DIAGNOSIS — D689 Coagulation defect, unspecified: Secondary | ICD-10-CM | POA: Diagnosis not present

## 2021-04-04 DIAGNOSIS — N2581 Secondary hyperparathyroidism of renal origin: Secondary | ICD-10-CM | POA: Diagnosis not present

## 2021-04-04 DIAGNOSIS — T8249XA Other complication of vascular dialysis catheter, initial encounter: Secondary | ICD-10-CM | POA: Diagnosis not present

## 2021-04-04 DIAGNOSIS — Z992 Dependence on renal dialysis: Secondary | ICD-10-CM | POA: Diagnosis not present

## 2021-04-04 DIAGNOSIS — N186 End stage renal disease: Secondary | ICD-10-CM | POA: Diagnosis not present

## 2021-04-04 DIAGNOSIS — E0821 Diabetes mellitus due to underlying condition with diabetic nephropathy: Secondary | ICD-10-CM | POA: Diagnosis not present

## 2021-04-04 DIAGNOSIS — D631 Anemia in chronic kidney disease: Secondary | ICD-10-CM | POA: Diagnosis not present

## 2021-04-06 DIAGNOSIS — D631 Anemia in chronic kidney disease: Secondary | ICD-10-CM | POA: Diagnosis not present

## 2021-04-06 DIAGNOSIS — N2581 Secondary hyperparathyroidism of renal origin: Secondary | ICD-10-CM | POA: Diagnosis not present

## 2021-04-06 DIAGNOSIS — N186 End stage renal disease: Secondary | ICD-10-CM | POA: Diagnosis not present

## 2021-04-06 DIAGNOSIS — E0821 Diabetes mellitus due to underlying condition with diabetic nephropathy: Secondary | ICD-10-CM | POA: Diagnosis not present

## 2021-04-06 DIAGNOSIS — T8249XA Other complication of vascular dialysis catheter, initial encounter: Secondary | ICD-10-CM | POA: Diagnosis not present

## 2021-04-06 DIAGNOSIS — Z992 Dependence on renal dialysis: Secondary | ICD-10-CM | POA: Diagnosis not present

## 2021-04-06 DIAGNOSIS — D689 Coagulation defect, unspecified: Secondary | ICD-10-CM | POA: Diagnosis not present

## 2021-04-08 DIAGNOSIS — T8249XA Other complication of vascular dialysis catheter, initial encounter: Secondary | ICD-10-CM | POA: Diagnosis not present

## 2021-04-08 DIAGNOSIS — Z992 Dependence on renal dialysis: Secondary | ICD-10-CM | POA: Diagnosis not present

## 2021-04-08 DIAGNOSIS — N186 End stage renal disease: Secondary | ICD-10-CM | POA: Diagnosis not present

## 2021-04-08 DIAGNOSIS — D689 Coagulation defect, unspecified: Secondary | ICD-10-CM | POA: Diagnosis not present

## 2021-04-08 DIAGNOSIS — N2581 Secondary hyperparathyroidism of renal origin: Secondary | ICD-10-CM | POA: Diagnosis not present

## 2021-04-08 DIAGNOSIS — D631 Anemia in chronic kidney disease: Secondary | ICD-10-CM | POA: Diagnosis not present

## 2021-04-08 DIAGNOSIS — E0821 Diabetes mellitus due to underlying condition with diabetic nephropathy: Secondary | ICD-10-CM | POA: Diagnosis not present

## 2021-04-11 DIAGNOSIS — N186 End stage renal disease: Secondary | ICD-10-CM | POA: Diagnosis not present

## 2021-04-11 DIAGNOSIS — N179 Acute kidney failure, unspecified: Secondary | ICD-10-CM | POA: Diagnosis not present

## 2021-04-11 DIAGNOSIS — T8249XA Other complication of vascular dialysis catheter, initial encounter: Secondary | ICD-10-CM | POA: Diagnosis not present

## 2021-04-11 DIAGNOSIS — N2581 Secondary hyperparathyroidism of renal origin: Secondary | ICD-10-CM | POA: Diagnosis not present

## 2021-04-11 DIAGNOSIS — Z992 Dependence on renal dialysis: Secondary | ICD-10-CM | POA: Diagnosis not present

## 2021-04-11 DIAGNOSIS — D689 Coagulation defect, unspecified: Secondary | ICD-10-CM | POA: Diagnosis not present

## 2021-04-11 DIAGNOSIS — E0821 Diabetes mellitus due to underlying condition with diabetic nephropathy: Secondary | ICD-10-CM | POA: Diagnosis not present

## 2021-04-13 ENCOUNTER — Other Ambulatory Visit: Payer: Self-pay | Admitting: *Deleted

## 2021-04-13 ENCOUNTER — Telehealth: Payer: Self-pay

## 2021-04-13 DIAGNOSIS — Z992 Dependence on renal dialysis: Secondary | ICD-10-CM | POA: Diagnosis not present

## 2021-04-13 DIAGNOSIS — D689 Coagulation defect, unspecified: Secondary | ICD-10-CM | POA: Diagnosis not present

## 2021-04-13 DIAGNOSIS — E0821 Diabetes mellitus due to underlying condition with diabetic nephropathy: Secondary | ICD-10-CM | POA: Diagnosis not present

## 2021-04-13 DIAGNOSIS — N186 End stage renal disease: Secondary | ICD-10-CM

## 2021-04-13 DIAGNOSIS — D631 Anemia in chronic kidney disease: Secondary | ICD-10-CM | POA: Diagnosis not present

## 2021-04-13 DIAGNOSIS — N2581 Secondary hyperparathyroidism of renal origin: Secondary | ICD-10-CM | POA: Diagnosis not present

## 2021-04-13 DIAGNOSIS — T8249XA Other complication of vascular dialysis catheter, initial encounter: Secondary | ICD-10-CM | POA: Diagnosis not present

## 2021-04-13 NOTE — Telephone Encounter (Signed)
Pt is s/p R AVF last month; kidney center nurse called to let us know pt had bruit/thrill this week on Tuesday and today does not have either but has a pulse. MD has been made aware and ordered a duplex and to see MD. Kidney center nurse is aware and will call pt to let him know of this appt.

## 2021-04-14 ENCOUNTER — Ambulatory Visit (HOSPITAL_COMMUNITY)
Admission: RE | Admit: 2021-04-14 | Discharge: 2021-04-14 | Disposition: A | Discharge status: Home / Self Care | Payer: Medicare Other | Source: Ambulatory Visit | Attending: Vascular Surgery | Admitting: Vascular Surgery

## 2021-04-14 ENCOUNTER — Other Ambulatory Visit: Payer: Self-pay

## 2021-04-14 ENCOUNTER — Ambulatory Visit (INDEPENDENT_AMBULATORY_CARE_PROVIDER_SITE_OTHER): Payer: Medicare Other | Admitting: Vascular Surgery

## 2021-04-14 ENCOUNTER — Encounter: Payer: Self-pay | Admitting: Vascular Surgery

## 2021-04-14 VITALS — BP 166/89 | HR 76 | Temp 98.3°F | Resp 20 | Ht 72.0 in | Wt 270.0 lb

## 2021-04-14 DIAGNOSIS — N186 End stage renal disease: Secondary | ICD-10-CM | POA: Diagnosis not present

## 2021-04-14 NOTE — Progress Notes (Signed)
 Patient ID: Alexander [name], male   DOB: 06/28/1961, 60 y.o.   MRN: [id number]  Reason for Consult: Follow-up   Referred by Tisovec, Richard W, MD  Subjective:     HPI:  Alexander [name] is a 60 y.o. male history of end-stage renal disease now on dialysis Tuesdays, Thursdays and Saturdays.  He is on Eliquis for atrial fibrillation.  Recently had right arm fistula placed by Dr. Dickson due to left-sided pacemaker.  He is right-hand dominant.  Fistula is failed to mature as identified in dialysis he is now sent for further evaluation.  Past Medical History:  Diagnosis Date  . Alcohol abuse   . Anemia   . Arthritis    "patient does not think so."  . Atrial fibrillation (HCC)   . Cardiac syncope 10/07/14   rapid A fib with 8 sec pauses on converison with syncope- temp pacing wire placed then PPM  . Cataract    BILATERAL  . Coronary artery disease   . Diabetes mellitus    dx---been  awhile  . Dysrhythmia   . Encounter for care of pacemaker 10/08/2014  . ESRD (end stage renal disease) (HCC)    [facility]  . History of blood transfusion   . Hyperlipemia   . Hypertension   . Osteomyelitis (HCC)    right foot  . Peripheral vascular disease (HCC)   . Presence of permanent cardiac pacemaker 10/08/2014   [employer]  . Sinus node dysfunction (HCC) 10/08/2014   Family History  Problem Relation Age of Onset  . Diabetes type II Mother   . Hypertension Mother   . Diabetes Mother   . Liver cancer Father   . Diabetes type II Sister   . Breast cancer Sister   . Diabetes Sister   . Hypertension Sister   . Diabetes type II Brother   . Kidney failure Brother   . Diabetes Brother   . Hypertension Brother   . Diabetes type II Sister    Past Surgical History:  Procedure Laterality Date  . ABDOMINAL AORTOGRAM W/LOWER EXTREMITY N/A 09/09/2017   Procedure: ABDOMINAL AORTOGRAM W/LOWER EXTREMITY;  Surgeon: Dickson, [name], MD;  [facility];  Service:  Cardiovascular;  Laterality: N/A;  . ABDOMINAL AORTOGRAM W/LOWER EXTREMITY Right 11/11/2019   Procedure: ABDOMINAL AORTOGRAM W/LOWER EXTREMITY;  Surgeon: Clark, Christopher J, MD;  [facility]: [facility];  Service: Cardiovascular;  Laterality: Right;  . AMPUTATION Left 08/19/2013   Procedure: AMPUTATION RAY;  Surgeon: [name] Graves, MD;  Location: [facility];  Service: Orthopedics;  Laterality: Left;  ray amputation left 5th  . AMPUTATION Left 10/27/2013   Procedure: AMPUTATION DIGIT-LEFT 4TH TOE, 4th and 5th metatarsal.;  Surgeon: [name] Dickson, MD;  Location: [facility];  Service: Vascular;  Laterality: Left;  . AMPUTATION Left 11/04/2013   Procedure: LEFT FOOT TRANS-METATARSAL AMPUTATION WITH WOUND CLOSURE ;  Surgeon: [name] Graves, MD;  [facility];  Service: Orthopedics;  Laterality: Left;  . AMPUTATION Right 09/10/2017   Procedure: RIGHT FOURTH AND FIFTH TOE AMPUTATION;  Surgeon: Dickson, [name], MD;  [facility];  Service: Vascular;  Laterality: Right;  . AMPUTATION Right 02/19/2020   Procedure: AMPUTATION RIGHT 3RD TOE;  Surgeon: Dickson, [name], MD;  [facility];  Service: Vascular;  Laterality: Right;  . AMPUTATION Right 03/11/2020   Procedure: RIGHT BELOW KNEE AMPUTATION;  Surgeon: Duda, Marcus V, MD;  [facility]: [facility];  Service: Orthopedics;    Laterality: Right;  . APPLICATION OF WOUND VAC Right 02/19/2020   Procedure: Application Of Wound Vac Right foot;  Surgeon: Angelia Mould, MD;  Location: [place];  Service: Vascular;  Laterality: Right;  . AV FISTULA PLACEMENT Right 03/27/2021   Procedure: RIGHT ARM ARTERIOVENOUS (AV) FISTULA;  Surgeon: Angelia Mould, MD;  Location: [place];  Service: Vascular;  Laterality: Right;  . BIOPSY  07/20/2020   Procedure: BIOPSY;  Surgeon: Rush Landmark Telford Nab., MD;  Location: [place];  Service: Gastroenterology;;  . CARDIAC CATHETERIZATION  10/07/2014   Procedure: LEFT HEART CATH AND CORONARY ANGIOGRAPHY;   Surgeon: Laverda Page, MD;  Location: [facility] CATH LAB;  Service: Cardiovascular;;  . COLONOSCOPY W/ POLYPECTOMY    . COLONOSCOPY WITH PROPOFOL N/A 05/25/2020   Procedure: COLONOSCOPY WITH PROPOFOL;  Surgeon: Gatha Mayer, MD;  Location: [facility];  Service: Endoscopy;  Laterality: N/A;  . EMBOLECTOMY Right 09/12/2017   Procedure: Thrombectomy  & Redo Right Below Knee Popliteal Artery Bypass Graft  ;  Surgeon: Waynetta Sandy, MD;  Location: [place];  Service: Vascular;  Laterality: Right;  . ENTEROSCOPY N/A 07/20/2020   Procedure: ENTEROSCOPY;  Surgeon: Mansouraty, Telford Nab., MD;  Location: [place];  Service: Gastroenterology;  Laterality: N/A;  . ESOPHAGOGASTRODUODENOSCOPY (EGD) WITH PROPOFOL N/A 05/24/2020   Procedure: ESOPHAGOGASTRODUODENOSCOPY (EGD) WITH PROPOFOL;  Surgeon: Ladene Artist, MD;  Location: [facility];  Service: Endoscopy;  Laterality: N/A;  . EYE SURGERY Bilateral    cataract  . FEMORAL-TIBIAL BYPASS GRAFT Left 10/27/2013   Procedure: BYPASS GRAFT LEFT FEMORAL- POSTERIOR TIBIAL ARTERY;  Surgeon: Angelia Mould, MD;  Location: [place];  Service: Vascular;  Laterality: Left;  . FEMORAL-TIBIAL BYPASS GRAFT Right 09/10/2017   Procedure: RIGHT SUPERFICIAL  FEMORAL ARTERY-BELOW KNEE POPLITEAL ARTERY BYPASS GRAFT WITH VEIN;  Surgeon: Angelia Mould, MD;  Location: [place];  Service: Vascular;  Laterality: Right;  . GIVENS CAPSULE STUDY N/A 07/20/2020   Procedure: GIVENS CAPSULE STUDY;  Surgeon: Irving Copas., MD;  Location: [place];  Service: Gastroenterology;  Laterality: N/A;  capsule placed through scope into duodenum @ 0936  . I & D EXTREMITY Left 10/27/2013   Procedure: IRRIGATION AND DEBRIDEMENT EXTREMITY- LEFT FOOT;  Surgeon: Angelia Mould, MD;  Location: [place];  Service: Vascular;  Laterality: Left;  . I & D EXTREMITY Right 01/26/2020   Procedure: IRRIGATION AND DEBRIDEMENT EXTREMITY RIGHT FOOT WOUND;  Surgeon: Angelia Mould, MD;  Location: [place];  Service: Vascular;  Laterality: Right;  . INTRAOPERATIVE ARTERIOGRAM Right 09/10/2017   Procedure: INTRA OPERATIVE ARTERIOGRAM;  Surgeon: Angelia Mould, MD;  Location: [place];  Service: Vascular;  Laterality: Right;  . IR ANGIOGRAM FOLLOW UP STUDY  09/09/2017  . IR FLUORO GUIDE CV LINE RIGHT  05/31/2020  . IR US GUIDE VASC ACCESS RIGHT  06/01/2020  . LEFT HEART CATH AND CORONARY ANGIOGRAPHY N/A 08/19/2020   Procedure: LEFT HEART CATH AND CORONARY ANGIOGRAPHY;  Surgeon: Adrian Prows, MD;  Location: [place] CV LAB;  Service: Cardiovascular;  Laterality: N/A;  . LEFT HEART CATHETERIZATION WITH CORONARY ANGIOGRAM N/A 11/09/2014   Procedure: LEFT HEART CATHETERIZATION WITH CORONARY ANGIOGRAM;  Surgeon: Laverda Page, MD;  Location: [facility] CATH LAB;  Service: Cardiovascular;  Laterality: N/A;  . LOWER EXTREMITY ANGIOGRAM Right 11/08/2015   Procedure: Lower Extremity Angiogram;  Surgeon: Serafina Mitchell, MD;  Location: [place] CV LAB;  Service: Cardiovascular;  Laterality: Right;  . LUMBAR LAMINECTOMY  06/13/2012  Procedure: MICRODISCECTOMY LUMBAR LAMINECTOMY;  Surgeon: Jessy Oto, MD;  Location: [place];  Service: Orthopedics;  Laterality: N/A;  Central laminectomy L2-3, L3-4, L4-5  . PACEMAKER INSERTION  10/08/14   MDT Advisa MRI compatible dual chamber pacemaker implanted by Dr Caryl Comes for syncope with post-termination pauses  . PERCUTANEOUS CORONARY STENT INTERVENTION (PCI-S)  11/09/2014   des to lad & distal circumflex         Dr  [name]  . PERIPHERAL VASCULAR BALLOON ANGIOPLASTY Right 11/11/2019   Procedure: PERIPHERAL VASCULAR BALLOON ANGIOPLASTY;  Surgeon: Marty Heck, MD;  Location: [place] CV LAB;  Service: Cardiovascular;  Laterality: Right;  below knee popliteal, tibioperoneal trunk, posterior tibial arteries  . PERIPHERAL VASCULAR CATHETERIZATION N/A 11/08/2015   Procedure: Abdominal Aortogram;  Surgeon: Serafina Mitchell, MD;   Location: [place] CV LAB;  Service: Cardiovascular;  Laterality: N/A;  . PERIPHERAL VASCULAR CATHETERIZATION Right 11/08/2015   Procedure: Peripheral Vascular Atherectomy;  Surgeon: Serafina Mitchell, MD;  Location: [place] CV LAB;  Service: Cardiovascular;  Laterality: Right;  . PERIPHERAL VASCULAR CATHETERIZATION N/A 10/10/2016   Procedure: Lower Extremity Angiography;  Surgeon: Waynetta Sandy, MD;  Location: [place] CV LAB;  Service: Cardiovascular;  Laterality: N/A;  . PERIPHERAL VASCULAR CATHETERIZATION Right 10/10/2016   Procedure: Peripheral Vascular Atherectomy;  Surgeon: Waynetta Sandy, MD;  Location: [place] CV LAB;  Service: Cardiovascular;  Laterality: Right;  Popliteal  . PERMANENT PACEMAKER INSERTION N/A 10/08/2014   Procedure: PERMANENT PACEMAKER INSERTION;  Surgeon: Deboraha Sprang, MD;  Location: [facility] CATH LAB;  Service: Cardiovascular;  Laterality: N/A;  . SUBMUCOSAL TATTOO INJECTION  07/20/2020   Procedure: SUBMUCOSAL TATTOO INJECTION;  Surgeon: Irving Copas., MD;  Location: [place];  Service: Gastroenterology;;  . TEMPORARY PACEMAKER INSERTION Bilateral 10/07/2014   Procedure: TEMPORARY PACEMAKER INSERTION;  Surgeon: Laverda Page, MD;  Location: [facility] CATH LAB;  Service: Cardiovascular;  Laterality: Bilateral;  . WOUND DEBRIDEMENT Right 02/19/2020   Procedure: DEBRIDEMENT WOUND RIGHT FOOT;  Surgeon: Angelia Mould, MD;  Location: [facility] OR;  Service: Vascular;  Laterality: Right;    Short Social History:  Social History   Tobacco Use  . Smoking status: Former Smoker    Packs/day: 1.50    Years: 30.00    Pack years: 45.00    Types: Cigarettes    Quit date: 10/07/2014    Years since quitting: 6.5  . Smokeless tobacco: Never Used  Substance Use Topics  . Alcohol use: Yes    Alcohol/week: 21.0 standard drinks    Types: 21 Glasses of wine per week    Allergies  Allergen Reactions  . Cytoxan [Cyclophosphamide]      pancytopenia  . Keflex [Cephalexin] Nausea And Vomiting  . Pletal [Cilostazol] Palpitations    "Can hear heart beating loudly".  . Sulfa Antibiotics Rash    Other reaction(s): Unknown    Current Outpatient Medications  Medication Sig Dispense Refill  . ACCU-CHEK FASTCLIX LANCETS MISC Check blood sugar TID & QHS 102 each 2  . amLODipine (NORVASC) 5 MG tablet Take 1 tablet (5 mg total) by mouth daily. 90 tablet 3  . atorvastatin (LIPITOR) 20 MG tablet Take 1 tablet by mouth once daily (Patient taking differently: Take 20 mg by mouth daily. pm) 90 tablet 0  . blood glucose meter kit and supplies KIT Check blood sugar TID & QHS 1 each 0  . Blood Glucose Monitoring Suppl (ACCU-CHEK ADVANTAGE DIABETES) kit Use as instructed 1 each 0  .  furosemide (LASIX) 80 MG tablet Take 80 mg by mouth See admin instructions. Mon., Wed, Fri, and sunday    . glucose blood (ACCU-CHEK AVIVA PLUS) test strip Use as instructed 100 each 12  . glucose blood (CHOICE DM FORA G20 TEST STRIPS) test strip Check blood sugar TID & QHS 100 each 12  . glucose blood test strip Use as instructed 100 each 12  . hydrALAZINE (APRESOLINE) 100 MG tablet Take 1 tablet (100 mg total) by mouth 2 (two) times daily. 180 tablet 3  . insulin NPH-regular Human (70-30) 100 UNIT/ML injection Inject 25 Units into the skin 2 (two) times daily with a meal. (Patient taking differently: Inject 80 Units into the skin 2 (two) times daily with a meal.) 10 mL 11  . metoprolol tartrate (LOPRESSOR) 100 MG tablet Take 1 tablet (100 mg total) by mouth 2 (two) times daily. (Patient taking differently: Take 25 mg by mouth 2 (two) times daily.) 180 tablet 3  . Multiple Vitamin (MULTIVITAMIN ADULT) TABS Take 1 tablet by mouth daily.    . oxyCODONE (ROXICODONE) 5 MG immediate release tablet Take 1 tablet (5 mg total) by mouth every 4 (four) hours as needed. 15 tablet 0  . pantoprazole (PROTONIX) 40 MG tablet Take 40 mg by mouth daily. Patient first said he is not  taking this and changed his mind, he is taking it.    . pantoprazole (PROTONIX) 40 MG tablet Take 40 mg by mouth daily.    . prazosin (MINIPRESS) 2 MG capsule TAKE 1 CAPSULE BY MOUTH ONCE DAILY AFTER SUPPER 90 capsule 0  . apixaban (ELIQUIS) 5 MG TABS tablet Take 1 tablet (5 mg total) by mouth 2 (two) times daily. 180 tablet 0   No current facility-administered medications for this visit.    Review of Systems  Constitutional:  Constitutional negative. HENT: HENT negative.  Eyes: Eyes negative.  Respiratory: Respiratory negative.  Cardiovascular: Cardiovascular negative.  Musculoskeletal: Musculoskeletal negative.  Skin: Skin negative.  Neurological: Neurological negative. Hematologic: Hematologic/lymphatic negative.  Psychiatric: Psychiatric negative.    No complaints related to today's visit    Objective:  Objective   Vitals:   04/14/21 1347  BP: (!) 166/89  Pulse: 76  Resp: 20  Temp: 98.3 F (36.8 C)  SpO2: 96%  Weight: 270 lb (122.5 kg)  Height: 6' (1.829 m)   Body mass index is 36.62 kg/m.  Physical Exam HENT:     Head: Normocephalic.     Nose:     Comments: Wearing a mask Eyes:     Pupils: Pupils are equal, round, and reactive to light.  Cardiovascular:     Pulses:          Radial pulses are 2+ on the right side and 2+ on the left side.  Pulmonary:     Effort: Pulmonary effort is normal.  Abdominal:     General: Abdomen is flat.  Musculoskeletal:     Comments: Right below-knee amputation  Right upper extremity wounds are healing well  There is pulsatility throughout his fistula in the upper arm it is very faint above the second incision  Skin:    Capillary Refill: Capillary refill takes less than 2 seconds.  Neurological:     General: No focal deficit present.     Mental Status: He is alert.  Psychiatric:        Mood and Affect: Mood normal.        Thought Content: Thought content normal.          Judgment: Judgment normal.      Data: +--------------------+----------+-----------------+---------------+  AVF         PSV (cm/s)Flow Vol (mL/min)  Comments    +--------------------+----------+-----------------+---------------+  Native artery inflow  156      233    High resistance  +--------------------+----------+-----------------+---------------+  AVF Anastomosis     82                     +--------------------+----------+-----------------+---------------+     +------------+----------+-------------+----------+--------+  OUTFLOW VEINPSV (cm/s)Diameter (cm)Depth (cm)Describe  +------------+----------+-------------+----------+--------+  Shoulder    44                    +------------+----------+-------------+----------+--------+  Prox UA     40    0.32     1.39        +------------+----------+-------------+----------+--------+  Mid UA     180    0.42     0.51        +------------+----------+-------------+----------+--------+  Dist UA     126    0.36     0.61        +------------+----------+-------------+----------+--------+  AC Fossa    609    0.88     0.99  stenotic  +------------+----------+-------------+----------+--------+  Stenosis is 2.6 cm from AVF anastomosis       Summary:  Arteriovenous fistula-Stenosis noted in the distal upper arm.      Assessment/Plan:     60-year-old male with end-stage renal disease dialyzing via catheter.  He has previously placed cephalic vein fistula on the right which is failed to mature does appear to have a stenosis just after the anastomosis.  May be that this fistula is not salvageable there is no preoperative vein mapping performed Dr. Dickson performed on table duplex.  We will plan for fistulogram possible intervention possible planning of future access surgeries in the right upper extremity.   I discussed this with the patient he demonstrates good understanding.  We will hold Eliquis 48 hours prior to procedure.     Brandon Christopher Cain MD [facility] [place]  

## 2021-04-14 NOTE — H&P (View-Only) (Signed)
 Patient ID: Alexander [name], male   DOB: 11/14/1960, 60 y.o.   MRN: [id number]  Reason for Consult: Follow-up   Referred by Tisovec, Richard W, MD  Subjective:     HPI:  Alexander [name] is a 60 y.o. male history of end-stage renal disease now on dialysis Tuesdays, Thursdays and Saturdays.  He is on Eliquis for atrial fibrillation.  Recently had right arm fistula placed by Dr. Dickson due to left-sided pacemaker.  He is right-hand dominant.  Fistula is failed to mature as identified in dialysis he is now sent for further evaluation.  Past Medical History:  Diagnosis Date  . Alcohol abuse   . Anemia   . Arthritis    "patient does not think so."  . Atrial fibrillation (HCC)   . Cardiac syncope 10/07/14   rapid A fib with 8 sec pauses on converison with syncope- temp pacing wire placed then PPM  . Cataract    BILATERAL  . Coronary artery disease   . Diabetes mellitus    dx---been  awhile  . Dysrhythmia   . Encounter for care of pacemaker 10/08/2014  . ESRD (end stage renal disease) (HCC)    [facility]  . History of blood transfusion   . Hyperlipemia   . Hypertension   . Osteomyelitis (HCC)    right foot  . Peripheral vascular disease (HCC)   . Presence of permanent cardiac pacemaker 10/08/2014   [employer]  . Sinus node dysfunction (HCC) 10/08/2014   Family History  Problem Relation Age of Onset  . Diabetes type II Mother   . Hypertension Mother   . Diabetes Mother   . Liver cancer Father   . Diabetes type II Sister   . Breast cancer Sister   . Diabetes Sister   . Hypertension Sister   . Diabetes type II Brother   . Kidney failure Brother   . Diabetes Brother   . Hypertension Brother   . Diabetes type II Sister    Past Surgical History:  Procedure Laterality Date  . ABDOMINAL AORTOGRAM W/LOWER EXTREMITY N/A 09/09/2017   Procedure: ABDOMINAL AORTOGRAM W/LOWER EXTREMITY;  Surgeon: Dickson, [name], MD;  [facility];  Service:  Cardiovascular;  Laterality: N/A;  . ABDOMINAL AORTOGRAM W/LOWER EXTREMITY Right 11/11/2019   Procedure: ABDOMINAL AORTOGRAM W/LOWER EXTREMITY;  Surgeon: Clark, Christopher J, MD;  [facility];  Service: Cardiovascular;  Laterality: Right;  . AMPUTATION Left 08/19/2013   Procedure: AMPUTATION RAY;  Surgeon: [name] Graves, MD;  [facility];  Service: Orthopedics;  Laterality: Left;  ray amputation left 5th  . AMPUTATION Left 10/27/2013   Procedure: AMPUTATION DIGIT-LEFT 4TH TOE, 4th and 5th metatarsal.;  Surgeon: [name] Dickson, MD;  [facility];  Service: Vascular;  Laterality: Left;  . AMPUTATION Left 11/04/2013   Procedure: LEFT FOOT TRANS-METATARSAL AMPUTATION WITH WOUND CLOSURE ;  Surgeon: [name] Graves, MD;  [facility];  Service: Orthopedics;  Laterality: Left;  . AMPUTATION Right 09/10/2017   Procedure: RIGHT FOURTH AND FIFTH TOE AMPUTATION;  Surgeon: Dickson, [name], MD;  [facility];  Service: Vascular;  Laterality: Right;  . AMPUTATION Right 02/19/2020   Procedure: AMPUTATION RIGHT 3RD TOE;  Surgeon: Dickson, [name], MD;  [facility];  Service: Vascular;  Laterality: Right;  . AMPUTATION Right 03/11/2020   Procedure: RIGHT BELOW KNEE AMPUTATION;  Surgeon: Duda, Marcus V, MD;  [facility];  Service: Orthopedics;    Laterality: Right;  . APPLICATION OF WOUND VAC Right 02/19/2020   Procedure: Application Of Wound Vac Right foot;  Surgeon: Dickson, [name], MD;  [facility];  Service: Vascular;  Laterality: Right;  . AV FISTULA PLACEMENT Right 03/27/2021   Procedure: RIGHT ARM ARTERIOVENOUS (AV) FISTULA;  Surgeon: Dickson, [name], MD;  [facility];  Service: Vascular;  Laterality: Right;  . BIOPSY  07/20/2020   Procedure: BIOPSY;  Surgeon: Mansouraty, Gabriel Jr., MD;  [facility];  Service: Gastroenterology;;  . CARDIAC CATHETERIZATION  10/07/2014   Procedure: LEFT HEART CATH AND CORONARY ANGIOGRAPHY;   Surgeon: Jagadeesh R Ganji, MD;  [facility];  Service: Cardiovascular;;  . COLONOSCOPY W/ POLYPECTOMY    . COLONOSCOPY WITH PROPOFOL N/A 05/25/2020   Procedure: COLONOSCOPY WITH PROPOFOL;  Surgeon: Gessner, Carl E, MD;  Location: [facility];  Service: Endoscopy;  Laterality: N/A;  . EMBOLECTOMY Right 09/12/2017   Procedure: Thrombectomy  & Redo Right Below Knee Popliteal Artery Bypass Graft  ;  Surgeon: Yalonda Sample Christopher, MD;  [facility];  Service: Vascular;  Laterality: Right;  . ENTEROSCOPY N/A 07/20/2020   Procedure: ENTEROSCOPY;  Surgeon: Mansouraty, Gabriel Jr., MD;  [facility];  Service: Gastroenterology;  Laterality: N/A;  . ESOPHAGOGASTRODUODENOSCOPY (EGD) WITH PROPOFOL N/A 05/24/2020   Procedure: ESOPHAGOGASTRODUODENOSCOPY (EGD) WITH PROPOFOL;  Surgeon: Stark, Malcolm T, MD;  [facility];  Service: Endoscopy;  Laterality: N/A;  . EYE SURGERY Bilateral    cataract  . FEMORAL-TIBIAL BYPASS GRAFT Left 10/27/2013   Procedure: BYPASS GRAFT LEFT FEMORAL- POSTERIOR TIBIAL ARTERY;  Surgeon: [name] Dickson, MD;  [facility];  Service: Vascular;  Laterality: Left;  . FEMORAL-TIBIAL BYPASS GRAFT Right 09/10/2017   Procedure: RIGHT SUPERFICIAL  FEMORAL ARTERY-BELOW KNEE POPLITEAL ARTERY BYPASS GRAFT WITH VEIN;  Surgeon: Dickson, [name], MD;  [facility];  Service: Vascular;  Laterality: Right;  . GIVENS CAPSULE STUDY N/A 07/20/2020   Procedure: GIVENS CAPSULE STUDY;  Surgeon: Mansouraty, Gabriel Jr., MD;  [facility];  Service: Gastroenterology;  Laterality: N/A;  capsule placed through scope into duodenum @ 0936  . I & D EXTREMITY Left 10/27/2013   Procedure: IRRIGATION AND DEBRIDEMENT EXTREMITY- LEFT FOOT;  Surgeon: [name] Dickson, MD;  [facility];  Service: Vascular;  Laterality: Left;  . I & D EXTREMITY Right 01/26/2020   Procedure: IRRIGATION AND DEBRIDEMENT EXTREMITY RIGHT FOOT WOUND;  Surgeon: Dickson,  [name], MD;  [facility];  Service: Vascular;  Laterality: Right;  . INTRAOPERATIVE ARTERIOGRAM Right 09/10/2017   Procedure: INTRA OPERATIVE ARTERIOGRAM;  Surgeon: Dickson, [name], MD;  [facility];  Service: Vascular;  Laterality: Right;  . IR ANGIOGRAM FOLLOW UP STUDY  09/09/2017  . IR FLUORO GUIDE CV LINE RIGHT  05/31/2020  . IR US GUIDE VASC ACCESS RIGHT  06/01/2020  . LEFT HEART CATH AND CORONARY ANGIOGRAPHY N/A 08/19/2020   Procedure: LEFT HEART CATH AND CORONARY ANGIOGRAPHY;  Surgeon: Ganji, Jay, MD;  [facility];  Service: Cardiovascular;  Laterality: N/A;  . LEFT HEART CATHETERIZATION WITH CORONARY ANGIOGRAM N/A 11/09/2014   Procedure: LEFT HEART CATHETERIZATION WITH CORONARY ANGIOGRAM;  Surgeon: Jagadeesh R Ganji, MD;  [facility];  Service: Cardiovascular;  Laterality: N/A;  . LOWER EXTREMITY ANGIOGRAM Right 11/08/2015   Procedure: Lower Extremity Angiogram;  Surgeon: Vance W Brabham, MD;  [facility];  Service: Cardiovascular;  Laterality: Right;  . LUMBAR LAMINECTOMY  06/13/2012     Procedure: MICRODISCECTOMY LUMBAR LAMINECTOMY;  Surgeon: James E Nitka, MD;  [facility];  Service: Orthopedics;  Laterality: N/A;  Central laminectomy L2-3, L3-4, L4-5  . PACEMAKER INSERTION  10/08/14   MDT Advisa MRI compatible dual chamber pacemaker implanted by Dr Klein for syncope with post-termination pauses  . PERCUTANEOUS CORONARY STENT INTERVENTION (PCI-S)  11/09/2014   des to lad & distal circumflex         Dr  Ganji  . PERIPHERAL VASCULAR BALLOON ANGIOPLASTY Right 11/11/2019   Procedure: PERIPHERAL VASCULAR BALLOON ANGIOPLASTY;  Surgeon: Clark, Christopher J, MD;  [facility];  Service: Cardiovascular;  Laterality: Right;  below knee popliteal, tibioperoneal trunk, posterior tibial arteries  . PERIPHERAL VASCULAR CATHETERIZATION N/A 11/08/2015   Procedure: Abdominal Aortogram;  Surgeon: Vance W Brabham, MD;   [facility];  Service: Cardiovascular;  Laterality: N/A;  . PERIPHERAL VASCULAR CATHETERIZATION Right 11/08/2015   Procedure: Peripheral Vascular Atherectomy;  Surgeon: Vance W Brabham, MD;  [facility];  Service: Cardiovascular;  Laterality: Right;  . PERIPHERAL VASCULAR CATHETERIZATION N/A 10/10/2016   Procedure: Lower Extremity Angiography;  Surgeon: Charletha Dalpe Christopher Aleina Burgio, MD;  [facility];  Service: Cardiovascular;  Laterality: N/A;  . PERIPHERAL VASCULAR CATHETERIZATION Right 10/10/2016   Procedure: Peripheral Vascular Atherectomy;  Surgeon: Jowana Thumma Christopher Danai Gotto, MD;  [facility];  Service: Cardiovascular;  Laterality: Right;  Popliteal  . PERMANENT PACEMAKER INSERTION N/A 10/08/2014   Procedure: PERMANENT PACEMAKER INSERTION;  Surgeon: Steven C Klein, MD;  [facility];  Service: Cardiovascular;  Laterality: N/A;  . SUBMUCOSAL TATTOO INJECTION  07/20/2020   Procedure: SUBMUCOSAL TATTOO INJECTION;  Surgeon: Mansouraty, Gabriel Jr., MD;  [facility];  Service: Gastroenterology;;  . TEMPORARY PACEMAKER INSERTION Bilateral 10/07/2014   Procedure: TEMPORARY PACEMAKER INSERTION;  Surgeon: Jagadeesh R Ganji, MD;  [facility];  Service: Cardiovascular;  Laterality: Bilateral;  . WOUND DEBRIDEMENT Right 02/19/2020   Procedure: DEBRIDEMENT WOUND RIGHT FOOT;  Surgeon: Dickson, [name], MD;  [facility];  Service: Vascular;  Laterality: Right;    Short Social History:  Social History   Tobacco Use  . Smoking status: Former Smoker    Packs/day: 1.50    Years: 30.00    Pack years: 45.00    Types: Cigarettes    Quit date: 10/07/2014    Years since quitting: 6.5  . Smokeless tobacco: Never Used  Substance Use Topics  . Alcohol use: Yes    Alcohol/week: 21.0 standard drinks    Types: 21 Glasses of wine per week    Allergies  Allergen Reactions  . Cytoxan [Cyclophosphamide]      pancytopenia  . Keflex [Cephalexin] Nausea And Vomiting  . Pletal [Cilostazol] Palpitations    "Can hear heart beating loudly".  . Sulfa Antibiotics Rash    Other reaction(s): Unknown    Current Outpatient Medications  Medication Sig Dispense Refill  . ACCU-CHEK FASTCLIX LANCETS MISC Check blood sugar TID & QHS 102 each 2  . amLODipine (NORVASC) 5 MG tablet Take 1 tablet (5 mg total) by mouth daily. 90 tablet 3  . atorvastatin (LIPITOR) 20 MG tablet Take 1 tablet by mouth once daily (Patient taking differently: Take 20 mg by mouth daily. pm) 90 tablet 0  . blood glucose meter kit and supplies KIT Check blood sugar TID & QHS 1 each 0  . Blood Glucose Monitoring Suppl (ACCU-CHEK ADVANTAGE DIABETES) kit Use as instructed 1 each 0  .   furosemide (LASIX) 80 MG tablet Take 80 mg by mouth See admin instructions. Mon., Wed, Fri, and sunday    . glucose blood (ACCU-CHEK AVIVA PLUS) test strip Use as instructed 100 each 12  . glucose blood (CHOICE DM FORA G20 TEST STRIPS) test strip Check blood sugar TID & QHS 100 each 12  . glucose blood test strip Use as instructed 100 each 12  . hydrALAZINE (APRESOLINE) 100 MG tablet Take 1 tablet (100 mg total) by mouth 2 (two) times daily. 180 tablet 3  . insulin NPH-regular Human (70-30) 100 UNIT/ML injection Inject 25 Units into the skin 2 (two) times daily with a meal. (Patient taking differently: Inject 80 Units into the skin 2 (two) times daily with a meal.) 10 mL 11  . metoprolol tartrate (LOPRESSOR) 100 MG tablet Take 1 tablet (100 mg total) by mouth 2 (two) times daily. (Patient taking differently: Take 25 mg by mouth 2 (two) times daily.) 180 tablet 3  . Multiple Vitamin (MULTIVITAMIN ADULT) TABS Take 1 tablet by mouth daily.    . oxyCODONE (ROXICODONE) 5 MG immediate release tablet Take 1 tablet (5 mg total) by mouth every 4 (four) hours as needed. 15 tablet 0  . pantoprazole (PROTONIX) 40 MG tablet Take 40 mg by mouth daily. Patient first said he is not  taking this and changed his mind, he is taking it.    . pantoprazole (PROTONIX) 40 MG tablet Take 40 mg by mouth daily.    . prazosin (MINIPRESS) 2 MG capsule TAKE 1 CAPSULE BY MOUTH ONCE DAILY AFTER SUPPER 90 capsule 0  . apixaban (ELIQUIS) 5 MG TABS tablet Take 1 tablet (5 mg total) by mouth 2 (two) times daily. 180 tablet 0   No current facility-administered medications for this visit.    Review of Systems  Constitutional:  Constitutional negative. HENT: HENT negative.  Eyes: Eyes negative.  Respiratory: Respiratory negative.  Cardiovascular: Cardiovascular negative.  Musculoskeletal: Musculoskeletal negative.  Skin: Skin negative.  Neurological: Neurological negative. Hematologic: Hematologic/lymphatic negative.  Psychiatric: Psychiatric negative.    No complaints related to today's visit    Objective:  Objective   Vitals:   04/14/21 1347  BP: (!) 166/89  Pulse: 76  Resp: 20  Temp: 98.3 F (36.8 C)  SpO2: 96%  Weight: 270 lb (122.5 kg)  Height: 6' (1.829 m)   Body mass index is 36.62 kg/m.  Physical Exam HENT:     Head: Normocephalic.     Nose:     Comments: Wearing a mask Eyes:     Pupils: Pupils are equal, round, and reactive to light.  Cardiovascular:     Pulses:          Radial pulses are 2+ on the right side and 2+ on the left side.  Pulmonary:     Effort: Pulmonary effort is normal.  Abdominal:     General: Abdomen is flat.  Musculoskeletal:     Comments: Right below-knee amputation  Right upper extremity wounds are healing well  There is pulsatility throughout his fistula in the upper arm it is very faint above the second incision  Skin:    Capillary Refill: Capillary refill takes less than 2 seconds.  Neurological:     General: No focal deficit present.     Mental Status: He is alert.  Psychiatric:        Mood and Affect: Mood normal.        Thought Content: Thought content normal.          Judgment: Judgment normal.      Data: +--------------------+----------+-----------------+---------------+  AVF         PSV (cm/s)Flow Vol (mL/min)  Comments    +--------------------+----------+-----------------+---------------+  Native artery inflow  156      233    High resistance  +--------------------+----------+-----------------+---------------+  AVF Anastomosis     82                     +--------------------+----------+-----------------+---------------+     +------------+----------+-------------+----------+--------+  OUTFLOW VEINPSV (cm/s)Diameter (cm)Depth (cm)Describe  +------------+----------+-------------+----------+--------+  Shoulder    44                    +------------+----------+-------------+----------+--------+  Prox UA     40    0.32     1.39        +------------+----------+-------------+----------+--------+  Mid UA     180    0.42     0.51        +------------+----------+-------------+----------+--------+  Dist UA     126    0.36     0.61        +------------+----------+-------------+----------+--------+  AC Fossa    609    0.88     0.99  stenotic  +------------+----------+-------------+----------+--------+  Stenosis is 2.6 cm from AVF anastomosis       Summary:  Arteriovenous fistula-Stenosis noted in the distal upper arm.      Assessment/Plan:     60-year-old male with end-stage renal disease dialyzing via catheter.  He has previously placed cephalic vein fistula on the right which is failed to mature does appear to have a stenosis just after the anastomosis.  May be that this fistula is not salvageable there is no preoperative vein mapping performed Dr. Dickson performed on table duplex.  We will plan for fistulogram possible intervention possible planning of future access surgeries in the right upper extremity.   I discussed this with the patient he demonstrates good understanding.  We will hold Eliquis 48 hours prior to procedure.     Aulden Calise Christopher Makaiah Terwilliger MD [facility]  

## 2021-04-15 DIAGNOSIS — N186 End stage renal disease: Secondary | ICD-10-CM | POA: Diagnosis not present

## 2021-04-15 DIAGNOSIS — Z992 Dependence on renal dialysis: Secondary | ICD-10-CM | POA: Diagnosis not present

## 2021-04-15 DIAGNOSIS — T8249XA Other complication of vascular dialysis catheter, initial encounter: Secondary | ICD-10-CM | POA: Diagnosis not present

## 2021-04-15 DIAGNOSIS — N2581 Secondary hyperparathyroidism of renal origin: Secondary | ICD-10-CM | POA: Diagnosis not present

## 2021-04-15 DIAGNOSIS — D689 Coagulation defect, unspecified: Secondary | ICD-10-CM | POA: Diagnosis not present

## 2021-04-15 DIAGNOSIS — E0821 Diabetes mellitus due to underlying condition with diabetic nephropathy: Secondary | ICD-10-CM | POA: Diagnosis not present

## 2021-04-15 DIAGNOSIS — D631 Anemia in chronic kidney disease: Secondary | ICD-10-CM | POA: Diagnosis not present

## 2021-04-18 DIAGNOSIS — N186 End stage renal disease: Secondary | ICD-10-CM | POA: Diagnosis not present

## 2021-04-18 DIAGNOSIS — Z992 Dependence on renal dialysis: Secondary | ICD-10-CM | POA: Diagnosis not present

## 2021-04-18 DIAGNOSIS — N2581 Secondary hyperparathyroidism of renal origin: Secondary | ICD-10-CM | POA: Diagnosis not present

## 2021-04-18 DIAGNOSIS — T8249XA Other complication of vascular dialysis catheter, initial encounter: Secondary | ICD-10-CM | POA: Diagnosis not present

## 2021-04-18 DIAGNOSIS — E0821 Diabetes mellitus due to underlying condition with diabetic nephropathy: Secondary | ICD-10-CM | POA: Diagnosis not present

## 2021-04-18 DIAGNOSIS — D631 Anemia in chronic kidney disease: Secondary | ICD-10-CM | POA: Diagnosis not present

## 2021-04-18 DIAGNOSIS — D689 Coagulation defect, unspecified: Secondary | ICD-10-CM | POA: Diagnosis not present

## 2021-04-20 ENCOUNTER — Encounter (HOSPITAL_COMMUNITY): Payer: Medicare Other

## 2021-04-20 ENCOUNTER — Ambulatory Visit: Payer: Medicare Other | Admitting: Vascular Surgery

## 2021-04-20 DIAGNOSIS — N186 End stage renal disease: Secondary | ICD-10-CM | POA: Diagnosis not present

## 2021-04-20 DIAGNOSIS — D689 Coagulation defect, unspecified: Secondary | ICD-10-CM | POA: Diagnosis not present

## 2021-04-20 DIAGNOSIS — E0821 Diabetes mellitus due to underlying condition with diabetic nephropathy: Secondary | ICD-10-CM | POA: Diagnosis not present

## 2021-04-20 DIAGNOSIS — T8249XA Other complication of vascular dialysis catheter, initial encounter: Secondary | ICD-10-CM | POA: Diagnosis not present

## 2021-04-20 DIAGNOSIS — N2581 Secondary hyperparathyroidism of renal origin: Secondary | ICD-10-CM | POA: Diagnosis not present

## 2021-04-20 DIAGNOSIS — Z992 Dependence on renal dialysis: Secondary | ICD-10-CM | POA: Diagnosis not present

## 2021-04-20 DIAGNOSIS — D631 Anemia in chronic kidney disease: Secondary | ICD-10-CM | POA: Diagnosis not present

## 2021-04-22 DIAGNOSIS — N2581 Secondary hyperparathyroidism of renal origin: Secondary | ICD-10-CM | POA: Diagnosis not present

## 2021-04-22 DIAGNOSIS — N186 End stage renal disease: Secondary | ICD-10-CM | POA: Diagnosis not present

## 2021-04-22 DIAGNOSIS — D689 Coagulation defect, unspecified: Secondary | ICD-10-CM | POA: Diagnosis not present

## 2021-04-22 DIAGNOSIS — T8249XA Other complication of vascular dialysis catheter, initial encounter: Secondary | ICD-10-CM | POA: Diagnosis not present

## 2021-04-22 DIAGNOSIS — E0821 Diabetes mellitus due to underlying condition with diabetic nephropathy: Secondary | ICD-10-CM | POA: Diagnosis not present

## 2021-04-22 DIAGNOSIS — D631 Anemia in chronic kidney disease: Secondary | ICD-10-CM | POA: Diagnosis not present

## 2021-04-22 DIAGNOSIS — Z992 Dependence on renal dialysis: Secondary | ICD-10-CM | POA: Diagnosis not present

## 2021-04-25 DIAGNOSIS — D689 Coagulation defect, unspecified: Secondary | ICD-10-CM | POA: Diagnosis not present

## 2021-04-25 DIAGNOSIS — Z992 Dependence on renal dialysis: Secondary | ICD-10-CM | POA: Diagnosis not present

## 2021-04-25 DIAGNOSIS — T8249XA Other complication of vascular dialysis catheter, initial encounter: Secondary | ICD-10-CM | POA: Diagnosis not present

## 2021-04-25 DIAGNOSIS — N2581 Secondary hyperparathyroidism of renal origin: Secondary | ICD-10-CM | POA: Diagnosis not present

## 2021-04-25 DIAGNOSIS — D631 Anemia in chronic kidney disease: Secondary | ICD-10-CM | POA: Diagnosis not present

## 2021-04-25 DIAGNOSIS — D509 Iron deficiency anemia, unspecified: Secondary | ICD-10-CM | POA: Diagnosis not present

## 2021-04-25 DIAGNOSIS — E0821 Diabetes mellitus due to underlying condition with diabetic nephropathy: Secondary | ICD-10-CM | POA: Diagnosis not present

## 2021-04-25 DIAGNOSIS — N186 End stage renal disease: Secondary | ICD-10-CM | POA: Diagnosis not present

## 2021-04-27 DIAGNOSIS — Z992 Dependence on renal dialysis: Secondary | ICD-10-CM | POA: Diagnosis not present

## 2021-04-27 DIAGNOSIS — T8249XA Other complication of vascular dialysis catheter, initial encounter: Secondary | ICD-10-CM | POA: Diagnosis not present

## 2021-04-27 DIAGNOSIS — N186 End stage renal disease: Secondary | ICD-10-CM | POA: Diagnosis not present

## 2021-04-27 DIAGNOSIS — D631 Anemia in chronic kidney disease: Secondary | ICD-10-CM | POA: Diagnosis not present

## 2021-04-27 DIAGNOSIS — N2581 Secondary hyperparathyroidism of renal origin: Secondary | ICD-10-CM | POA: Diagnosis not present

## 2021-04-27 DIAGNOSIS — D509 Iron deficiency anemia, unspecified: Secondary | ICD-10-CM | POA: Diagnosis not present

## 2021-04-27 DIAGNOSIS — D689 Coagulation defect, unspecified: Secondary | ICD-10-CM | POA: Diagnosis not present

## 2021-04-27 DIAGNOSIS — E0821 Diabetes mellitus due to underlying condition with diabetic nephropathy: Secondary | ICD-10-CM | POA: Diagnosis not present

## 2021-04-28 ENCOUNTER — Telehealth: Payer: Self-pay

## 2021-04-28 NOTE — Telephone Encounter (Signed)
Just FYI I placed an order as requested for forearm crutches on 03/20/21 just received update from [employer] that the pt states that he is trying to manage on his own and declined the DME.

## 2021-04-29 DIAGNOSIS — D509 Iron deficiency anemia, unspecified: Secondary | ICD-10-CM | POA: Diagnosis not present

## 2021-04-29 DIAGNOSIS — N186 End stage renal disease: Secondary | ICD-10-CM | POA: Diagnosis not present

## 2021-04-29 DIAGNOSIS — D631 Anemia in chronic kidney disease: Secondary | ICD-10-CM | POA: Diagnosis not present

## 2021-04-29 DIAGNOSIS — E0821 Diabetes mellitus due to underlying condition with diabetic nephropathy: Secondary | ICD-10-CM | POA: Diagnosis not present

## 2021-04-29 DIAGNOSIS — Z992 Dependence on renal dialysis: Secondary | ICD-10-CM | POA: Diagnosis not present

## 2021-04-29 DIAGNOSIS — N2581 Secondary hyperparathyroidism of renal origin: Secondary | ICD-10-CM | POA: Diagnosis not present

## 2021-04-29 DIAGNOSIS — D689 Coagulation defect, unspecified: Secondary | ICD-10-CM | POA: Diagnosis not present

## 2021-04-29 DIAGNOSIS — T8249XA Other complication of vascular dialysis catheter, initial encounter: Secondary | ICD-10-CM | POA: Diagnosis not present

## 2021-05-01 ENCOUNTER — Other Ambulatory Visit: Payer: Self-pay

## 2021-05-01 ENCOUNTER — Encounter (HOSPITAL_COMMUNITY): Payer: Self-pay | Admitting: Vascular Surgery

## 2021-05-01 ENCOUNTER — Ambulatory Visit (HOSPITAL_COMMUNITY)
Admission: RE | Admit: 2021-05-01 | Discharge: 2021-05-01 | Disposition: A | Discharge status: Home / Self Care | Payer: Medicare Other | Attending: Vascular Surgery | Admitting: Vascular Surgery

## 2021-05-01 ENCOUNTER — Encounter (HOSPITAL_COMMUNITY)
Admission: RE | Disposition: A | Discharge status: Home / Self Care | Payer: Self-pay | Source: Home / Self Care | Attending: Vascular Surgery

## 2021-05-01 DIAGNOSIS — Z882 Allergy status to sulfonamides status: Secondary | ICD-10-CM | POA: Diagnosis not present

## 2021-05-01 DIAGNOSIS — Z881 Allergy status to other antibiotic agents status: Secondary | ICD-10-CM | POA: Diagnosis not present

## 2021-05-01 DIAGNOSIS — Z538 Procedure and treatment not carried out for other reasons: Secondary | ICD-10-CM | POA: Diagnosis not present

## 2021-05-01 DIAGNOSIS — Z992 Dependence on renal dialysis: Secondary | ICD-10-CM | POA: Insufficient documentation

## 2021-05-01 DIAGNOSIS — I4891 Unspecified atrial fibrillation: Secondary | ICD-10-CM | POA: Insufficient documentation

## 2021-05-01 DIAGNOSIS — Z79899 Other long term (current) drug therapy: Secondary | ICD-10-CM | POA: Diagnosis not present

## 2021-05-01 DIAGNOSIS — Z794 Long term (current) use of insulin: Secondary | ICD-10-CM | POA: Insufficient documentation

## 2021-05-01 DIAGNOSIS — N186 End stage renal disease: Secondary | ICD-10-CM | POA: Diagnosis not present

## 2021-05-01 DIAGNOSIS — Z87891 Personal history of nicotine dependence: Secondary | ICD-10-CM | POA: Diagnosis not present

## 2021-05-01 DIAGNOSIS — I120 Hypertensive chronic kidney disease with stage 5 chronic kidney disease or end stage renal disease: Secondary | ICD-10-CM | POA: Diagnosis not present

## 2021-05-01 DIAGNOSIS — Y832 Surgical operation with anastomosis, bypass or graft as the cause of abnormal reaction of the patient, or of later complication, without mention of misadventure at the time of the procedure: Secondary | ICD-10-CM | POA: Diagnosis not present

## 2021-05-01 DIAGNOSIS — T82510A Breakdown (mechanical) of surgically created arteriovenous fistula, initial encounter: Secondary | ICD-10-CM | POA: Diagnosis not present

## 2021-05-01 DIAGNOSIS — Y841 Kidney dialysis as the cause of abnormal reaction of the patient, or of later complication, without mention of misadventure at the time of the procedure: Secondary | ICD-10-CM | POA: Diagnosis not present

## 2021-05-01 DIAGNOSIS — Z7901 Long term (current) use of anticoagulants: Secondary | ICD-10-CM | POA: Insufficient documentation

## 2021-05-01 DIAGNOSIS — Z950 Presence of cardiac pacemaker: Secondary | ICD-10-CM | POA: Diagnosis not present

## 2021-05-01 DIAGNOSIS — T82898A Other specified complication of vascular prosthetic devices, implants and grafts, initial encounter: Secondary | ICD-10-CM | POA: Diagnosis not present

## 2021-05-01 HISTORY — PX: A/V FISTULAGRAM: CATH118298

## 2021-05-01 LAB — POCT I-STAT, CHEM 8
BUN: 55 mg/dL — ABNORMAL HIGH (ref 8–23)
Calcium, Ion: 1.18 mmol/L (ref 1.15–1.40)
Chloride: 95 mmol/L — ABNORMAL LOW (ref 98–111)
Creatinine, Ser: 5.6 mg/dL — ABNORMAL HIGH (ref 0.61–1.24)
Glucose, Bld: 310 mg/dL — ABNORMAL HIGH (ref 70–99)
HCT: 28 % — ABNORMAL LOW (ref 39.0–52.0)
Hemoglobin: 9.5 g/dL — ABNORMAL LOW (ref 13.0–17.0)
Potassium: 4.2 mmol/L (ref 3.5–5.1)
Sodium: 135 mmol/L (ref 135–145)
TCO2: 29 mmol/L (ref 22–32)

## 2021-05-01 SURGERY — A/V FISTULAGRAM
Anesthesia: LOCAL | Laterality: Right

## 2021-05-01 MED ORDER — LIDOCAINE HCL (PF) 1 % IJ SOLN
INTRAMUSCULAR | Status: AC
  Filled 2021-05-01: qty 30

## 2021-05-01 MED ORDER — FENTANYL CITRATE (PF) 100 MCG/2ML IJ SOLN
INTRAMUSCULAR | Status: AC
  Filled 2021-05-01: qty 2

## 2021-05-01 MED ORDER — SODIUM CHLORIDE 0.9% FLUSH: 3.0000 mL | INTRAVENOUS | Status: DC | PRN

## 2021-05-01 MED ORDER — HEPARIN (PORCINE) IN NACL 1000-0.9 UT/500ML-% IV SOLN
INTRAVENOUS | Status: AC
  Filled 2021-05-01: qty 500

## 2021-05-01 MED ORDER — FENTANYL CITRATE (PF) 100 MCG/2ML IJ SOLN
INTRAMUSCULAR | Status: DC | PRN
  Administered 2021-05-01: 50 ug via INTRAVENOUS

## 2021-05-01 MED ORDER — HEPARIN (PORCINE) IN NACL 1000-0.9 UT/500ML-% IV SOLN
INTRAVENOUS | Status: DC | PRN
  Administered 2021-05-01: 500 mL

## 2021-05-01 MED ORDER — LIDOCAINE HCL (PF) 1 % IJ SOLN
INTRAMUSCULAR | Status: DC | PRN
  Administered 2021-05-01: 2 mL

## 2021-05-01 MED ORDER — SODIUM CHLORIDE 0.9 % IV SOLN: 250.0000 mL | INTRAVENOUS | Status: DC | PRN

## 2021-05-01 MED ORDER — IODIXANOL 320 MG/ML IV SOLN: INTRAVENOUS | Status: DC | PRN

## 2021-05-01 MED ORDER — SODIUM CHLORIDE 0.9% FLUSH: 3.0000 mL | Freq: Two times a day (BID) | INTRAVENOUS | Status: DC

## 2021-05-01 SURGICAL SUPPLY — 10 items
BAG SNAP BAND KOVER 36X36 (MISCELLANEOUS) ×2 IMPLANT
COVER DOME SNAP 22 D (MISCELLANEOUS) ×2 IMPLANT
KIT MICROPUNCTURE NIT STIFF (SHEATH) ×4 IMPLANT
PROTECTION STATION PRESSURIZED (MISCELLANEOUS) ×2
SHEATH PROBE COVER 6X72 (BAG) ×2 IMPLANT
STATION PROTECTION PRESSURIZED (MISCELLANEOUS) ×1 IMPLANT
STOPCOCK MORSE 400PSI 3WAY (MISCELLANEOUS) ×2 IMPLANT
TRAY PV CATH (CUSTOM PROCEDURE TRAY) ×2 IMPLANT
TUBING CIL FLEX 10 FLL-RA (TUBING) ×2 IMPLANT
WIRE TORQFLEX AUST .018X40CM (WIRE) ×4 IMPLANT

## 2021-05-01 NOTE — Interval H&P Note (Signed)
History and Physical Interval Note:  05/01/2021 7:07 AM  Alexander [name]  has presented today for surgery, with the diagnosis of end stage renal.  The various methods of treatment have been discussed with the patient and family. After consideration of risks, benefits and other options for treatment, the patient has consented to  Procedure(s): A/V FISTULAGRAM (N/A) as a surgical intervention.  The patient's history has been reviewed, patient examined, no change in status, stable for surgery.  I have reviewed the patient's chart and labs.  Questions were answered to the patient's satisfaction.     [name]

## 2021-05-01 NOTE — Op Note (Signed)
    Patient name: Alexander [name] MRN: [id number] DOB: 06-Dec-1960 Sex: male  05/01/2021 Pre-operative Diagnosis: esrd, malfunction right arm avf  Post-operative diagnosis:  Same Surgeon:  Eda Paschal. Donzetta Matters, MD Procedure Performed: Ultrasound-guided cannulation right arm AV fistula  Indications: 60 year old male with end-stage renal disease now on dialysis via right IJ catheter.  He is a left-sided pacemaker he is right-handed.  He has undergone right brachial artery to cephalic vein AV fistula creation which has failed to mature he is now indicated for fistulogram possible intervention.  Findings: Fistula was only patent for approximately 2 cm after the anastomosis.  I was unable to get a wire to pass and cause the hematoma therefore aborted the procedure.  I did use ultrasound to evaluate his upper arm he appears to have a basilic vein in the antecubitum that may be satisfactory for fistula creation.  Patient will be scheduled for fistula versus graft of his right upper extremity on a nondialysis day in the near future.   Procedure:  The patient was identified in the holding area and taken to room 8.  The patient was then placed supine on the table and prepped and draped in the usual sterile fashion.  A time out was called.  Ultrasound was used to evaluate the right arm AV fistula.  Near the anastomosis it was patent was very diminutive after this.  I was able to cannulated on 2 separate occasions with micropuncture needle and I got a wire to pass I could not get the sheath and I hematoma was because I elected to abort the procedure.  I did use ultrasound to evaluate the rest of his upper extremity which appears to have suitable basilic vein for fistula creation.  He will be scheduled for a fistula versus graft in his right upper extremity in the near future.   Contrast: Court Joy C. Donzetta Matters, MD [facility] Office: [phone] Pager: [phone] '

## 2021-05-02 DIAGNOSIS — D509 Iron deficiency anemia, unspecified: Secondary | ICD-10-CM | POA: Diagnosis not present

## 2021-05-02 DIAGNOSIS — N2581 Secondary hyperparathyroidism of renal origin: Secondary | ICD-10-CM | POA: Diagnosis not present

## 2021-05-02 DIAGNOSIS — E0821 Diabetes mellitus due to underlying condition with diabetic nephropathy: Secondary | ICD-10-CM | POA: Diagnosis not present

## 2021-05-02 DIAGNOSIS — D631 Anemia in chronic kidney disease: Secondary | ICD-10-CM | POA: Diagnosis not present

## 2021-05-02 DIAGNOSIS — N186 End stage renal disease: Secondary | ICD-10-CM | POA: Diagnosis not present

## 2021-05-02 DIAGNOSIS — Z992 Dependence on renal dialysis: Secondary | ICD-10-CM | POA: Diagnosis not present

## 2021-05-02 DIAGNOSIS — D689 Coagulation defect, unspecified: Secondary | ICD-10-CM | POA: Diagnosis not present

## 2021-05-02 DIAGNOSIS — T8249XA Other complication of vascular dialysis catheter, initial encounter: Secondary | ICD-10-CM | POA: Diagnosis not present

## 2021-05-03 ENCOUNTER — Other Ambulatory Visit: Payer: Self-pay

## 2021-05-03 ENCOUNTER — Encounter (HOSPITAL_COMMUNITY): Payer: Medicare Other

## 2021-05-04 DIAGNOSIS — D509 Iron deficiency anemia, unspecified: Secondary | ICD-10-CM | POA: Diagnosis not present

## 2021-05-04 DIAGNOSIS — N2581 Secondary hyperparathyroidism of renal origin: Secondary | ICD-10-CM | POA: Diagnosis not present

## 2021-05-04 DIAGNOSIS — D689 Coagulation defect, unspecified: Secondary | ICD-10-CM | POA: Diagnosis not present

## 2021-05-04 DIAGNOSIS — D631 Anemia in chronic kidney disease: Secondary | ICD-10-CM | POA: Diagnosis not present

## 2021-05-04 DIAGNOSIS — T8249XA Other complication of vascular dialysis catheter, initial encounter: Secondary | ICD-10-CM | POA: Diagnosis not present

## 2021-05-04 DIAGNOSIS — E0821 Diabetes mellitus due to underlying condition with diabetic nephropathy: Secondary | ICD-10-CM | POA: Diagnosis not present

## 2021-05-04 DIAGNOSIS — N186 End stage renal disease: Secondary | ICD-10-CM | POA: Diagnosis not present

## 2021-05-04 DIAGNOSIS — Z992 Dependence on renal dialysis: Secondary | ICD-10-CM | POA: Diagnosis not present

## 2021-05-06 DIAGNOSIS — D509 Iron deficiency anemia, unspecified: Secondary | ICD-10-CM | POA: Diagnosis not present

## 2021-05-06 DIAGNOSIS — D689 Coagulation defect, unspecified: Secondary | ICD-10-CM | POA: Diagnosis not present

## 2021-05-06 DIAGNOSIS — D631 Anemia in chronic kidney disease: Secondary | ICD-10-CM | POA: Diagnosis not present

## 2021-05-06 DIAGNOSIS — E0821 Diabetes mellitus due to underlying condition with diabetic nephropathy: Secondary | ICD-10-CM | POA: Diagnosis not present

## 2021-05-06 DIAGNOSIS — Z992 Dependence on renal dialysis: Secondary | ICD-10-CM | POA: Diagnosis not present

## 2021-05-06 DIAGNOSIS — N2581 Secondary hyperparathyroidism of renal origin: Secondary | ICD-10-CM | POA: Diagnosis not present

## 2021-05-06 DIAGNOSIS — T8249XA Other complication of vascular dialysis catheter, initial encounter: Secondary | ICD-10-CM | POA: Diagnosis not present

## 2021-05-06 DIAGNOSIS — N186 End stage renal disease: Secondary | ICD-10-CM | POA: Diagnosis not present

## 2021-05-09 ENCOUNTER — Encounter (HOSPITAL_COMMUNITY): Payer: Self-pay | Admitting: Vascular Surgery

## 2021-05-09 ENCOUNTER — Other Ambulatory Visit: Payer: Self-pay

## 2021-05-09 ENCOUNTER — Telehealth: Payer: Self-pay

## 2021-05-09 DIAGNOSIS — N2581 Secondary hyperparathyroidism of renal origin: Secondary | ICD-10-CM | POA: Diagnosis not present

## 2021-05-09 DIAGNOSIS — D689 Coagulation defect, unspecified: Secondary | ICD-10-CM | POA: Diagnosis not present

## 2021-05-09 DIAGNOSIS — D509 Iron deficiency anemia, unspecified: Secondary | ICD-10-CM | POA: Diagnosis not present

## 2021-05-09 DIAGNOSIS — Z992 Dependence on renal dialysis: Secondary | ICD-10-CM | POA: Diagnosis not present

## 2021-05-09 DIAGNOSIS — D631 Anemia in chronic kidney disease: Secondary | ICD-10-CM | POA: Diagnosis not present

## 2021-05-09 DIAGNOSIS — N186 End stage renal disease: Secondary | ICD-10-CM | POA: Diagnosis not present

## 2021-05-09 DIAGNOSIS — T8249XA Other complication of vascular dialysis catheter, initial encounter: Secondary | ICD-10-CM | POA: Diagnosis not present

## 2021-05-09 DIAGNOSIS — E0821 Diabetes mellitus due to underlying condition with diabetic nephropathy: Secondary | ICD-10-CM | POA: Diagnosis not present

## 2021-05-09 NOTE — Progress Notes (Addendum)
Mr. Cromartie  denies chest pain or shortness of breath. Patient denies any s/s of Covid in her home and is unaware of any exposures.   Mr. Hidalgo has type III diabetes. Patyient reports that CBG was 300 this pm, it has been running in the 200' to  300's.  Mr Penna reports that he took 1/2 of 70/30 Insulin tonight with dinner, as he was instructed by Dr. Gabriel Carina. I instructed patient to check CBG after awaking and every 2 hours until arrival  to the hospital.  I Instructed patient if CBG is less than 70 to take 4 Glucose Tablets or 1 tube of Glucose Gel or 1/2 cup of a clear juice. Recheck CBG in 15 minutes if CBG is not over 70 call, pre- op desk at [phone] for further instructions. If CBG is> 280 call the pre- op desk and report the CBGt o see if there `ar more instructions.

## 2021-05-09 NOTE — Telephone Encounter (Signed)
Spoke with pt to update on arrival time for surgery of 1030 AM on tomorrow at [facility]. Pt voiced understanding.

## 2021-05-10 ENCOUNTER — Ambulatory Visit (HOSPITAL_COMMUNITY): Payer: Medicare Other | Admitting: Anesthesiology

## 2021-05-10 ENCOUNTER — Encounter (HOSPITAL_COMMUNITY)
Admission: RE | Disposition: A | Discharge status: Home / Self Care | Payer: Self-pay | Source: Home / Self Care | Attending: Vascular Surgery

## 2021-05-10 ENCOUNTER — Encounter (HOSPITAL_COMMUNITY): Payer: Self-pay | Admitting: Vascular Surgery

## 2021-05-10 ENCOUNTER — Ambulatory Visit (HOSPITAL_COMMUNITY)
Admission: RE | Admit: 2021-05-10 | Discharge: 2021-05-10 | Disposition: A | Discharge status: Home / Self Care | Payer: Medicare Other | Attending: Vascular Surgery | Admitting: Vascular Surgery

## 2021-05-10 DIAGNOSIS — Z888 Allergy status to other drugs, medicaments and biological substances status: Secondary | ICD-10-CM | POA: Insufficient documentation

## 2021-05-10 DIAGNOSIS — Z89511 Acquired absence of right leg below knee: Secondary | ICD-10-CM | POA: Diagnosis not present

## 2021-05-10 DIAGNOSIS — Z87891 Personal history of nicotine dependence: Secondary | ICD-10-CM | POA: Diagnosis not present

## 2021-05-10 DIAGNOSIS — E1122 Type 2 diabetes mellitus with diabetic chronic kidney disease: Secondary | ICD-10-CM | POA: Diagnosis not present

## 2021-05-10 DIAGNOSIS — Z7901 Long term (current) use of anticoagulants: Secondary | ICD-10-CM | POA: Insufficient documentation

## 2021-05-10 DIAGNOSIS — T82868A Thrombosis of vascular prosthetic devices, implants and grafts, initial encounter: Secondary | ICD-10-CM | POA: Insufficient documentation

## 2021-05-10 DIAGNOSIS — N186 End stage renal disease: Secondary | ICD-10-CM | POA: Insufficient documentation

## 2021-05-10 DIAGNOSIS — I4891 Unspecified atrial fibrillation: Secondary | ICD-10-CM | POA: Diagnosis not present

## 2021-05-10 DIAGNOSIS — Y832 Surgical operation with anastomosis, bypass or graft as the cause of abnormal reaction of the patient, or of later complication, without mention of misadventure at the time of the procedure: Secondary | ICD-10-CM | POA: Insufficient documentation

## 2021-05-10 DIAGNOSIS — I120 Hypertensive chronic kidney disease with stage 5 chronic kidney disease or end stage renal disease: Secondary | ICD-10-CM | POA: Insufficient documentation

## 2021-05-10 DIAGNOSIS — Z89422 Acquired absence of other left toe(s): Secondary | ICD-10-CM | POA: Insufficient documentation

## 2021-05-10 DIAGNOSIS — T82898A Other specified complication of vascular prosthetic devices, implants and grafts, initial encounter: Secondary | ICD-10-CM | POA: Diagnosis not present

## 2021-05-10 DIAGNOSIS — Z95820 Peripheral vascular angioplasty status with implants and grafts: Secondary | ICD-10-CM | POA: Insufficient documentation

## 2021-05-10 DIAGNOSIS — Z882 Allergy status to sulfonamides status: Secondary | ICD-10-CM | POA: Diagnosis not present

## 2021-05-10 DIAGNOSIS — Z794 Long term (current) use of insulin: Secondary | ICD-10-CM | POA: Insufficient documentation

## 2021-05-10 DIAGNOSIS — Z881 Allergy status to other antibiotic agents status: Secondary | ICD-10-CM | POA: Diagnosis not present

## 2021-05-10 DIAGNOSIS — Z992 Dependence on renal dialysis: Secondary | ICD-10-CM | POA: Diagnosis not present

## 2021-05-10 DIAGNOSIS — Z950 Presence of cardiac pacemaker: Secondary | ICD-10-CM | POA: Diagnosis not present

## 2021-05-10 DIAGNOSIS — Z79899 Other long term (current) drug therapy: Secondary | ICD-10-CM | POA: Diagnosis not present

## 2021-05-10 DIAGNOSIS — T82590A Other mechanical complication of surgically created arteriovenous fistula, initial encounter: Secondary | ICD-10-CM | POA: Diagnosis not present

## 2021-05-10 HISTORY — PX: AV FISTULA PLACEMENT: SHX1204

## 2021-05-10 HISTORY — DX: Gastro-esophageal reflux disease without esophagitis: K21.9

## 2021-05-10 LAB — GLUCOSE, CAPILLARY
Glucose-Capillary: 278 mg/dL — ABNORMAL HIGH (ref 70–99)
Glucose-Capillary: 200 mg/dL — ABNORMAL HIGH (ref 70–99)

## 2021-05-10 LAB — POCT I-STAT, CHEM 8
BUN: 38 mg/dL — ABNORMAL HIGH (ref 8–23)
Calcium, Ion: 1.09 mmol/L — ABNORMAL LOW (ref 1.15–1.40)
Chloride: 97 mmol/L — ABNORMAL LOW (ref 98–111)
Creatinine, Ser: 5.4 mg/dL — ABNORMAL HIGH (ref 0.61–1.24)
Glucose, Bld: 237 mg/dL — ABNORMAL HIGH (ref 70–99)
HCT: 31 % — ABNORMAL LOW (ref 39.0–52.0)
Hemoglobin: 10.5 g/dL — ABNORMAL LOW (ref 13.0–17.0)
Potassium: 3.8 mmol/L (ref 3.5–5.1)
Sodium: 137 mmol/L (ref 135–145)
TCO2: 28 mmol/L (ref 22–32)

## 2021-05-10 SURGERY — ARTERIOVENOUS (AV) FISTULA CREATION
Anesthesia: Monitor Anesthesia Care | Laterality: Right

## 2021-05-10 MED ORDER — FENTANYL CITRATE (PF) 100 MCG/2ML IJ SOLN
100.0000 ug | Freq: Once | INTRAMUSCULAR | Status: AC
Start: 2021-05-10 — End: 2021-05-10

## 2021-05-10 MED ORDER — PHENYLEPHRINE 40 MCG/ML (10ML) SYRINGE FOR IV PUSH (FOR BLOOD PRESSURE SUPPORT)
PREFILLED_SYRINGE | INTRAVENOUS | Status: DC | PRN
  Administered 2021-05-10: 80 ug via INTRAVENOUS

## 2021-05-10 MED ORDER — CHLORHEXIDINE GLUCONATE 4 % EX LIQD: 60.0000 mL | Freq: Once | CUTANEOUS | Status: DC

## 2021-05-10 MED ORDER — ROPIVACAINE HCL 7.5 MG/ML IJ SOLN
INTRAMUSCULAR | Status: DC | PRN
  Administered 2021-05-10: 20 mL via PERINEURAL

## 2021-05-10 MED ORDER — 0.9 % SODIUM CHLORIDE (POUR BTL) OPTIME
TOPICAL | Status: DC | PRN
  Administered 2021-05-10: 1000 mL

## 2021-05-10 MED ORDER — MIDAZOLAM HCL 2 MG/2ML IJ SOLN
INTRAMUSCULAR | Status: AC
  Filled 2021-05-10: qty 2

## 2021-05-10 MED ORDER — SODIUM CHLORIDE 0.9 % IV SOLN: INTRAVENOUS | Status: DC

## 2021-05-10 MED ORDER — DIPHENHYDRAMINE HCL 50 MG/ML IJ SOLN
INTRAMUSCULAR | Status: DC | PRN
  Administered 2021-05-10: 10 mg via INTRAVENOUS

## 2021-05-10 MED ORDER — PROPOFOL 10 MG/ML IV BOLUS
INTRAVENOUS | Status: AC
  Filled 2021-05-10: qty 20

## 2021-05-10 MED ORDER — OXYCODONE HCL 5 MG PO TABS: 5.0000 mg | ORAL_TABLET | ORAL | 0 refills | Status: DC | PRN

## 2021-05-10 MED ORDER — VANCOMYCIN HCL IN DEXTROSE 1-5 GM/200ML-% IV SOLN
1000.0000 mg | INTRAVENOUS | Status: AC
  Administered 2021-05-10: 1000 mg via INTRAVENOUS
  Filled 2021-05-10: qty 200

## 2021-05-10 MED ORDER — PROTAMINE SULFATE 10 MG/ML IV SOLN
INTRAVENOUS | Status: AC
  Filled 2021-05-10: qty 5

## 2021-05-10 MED ORDER — PROPOFOL 500 MG/50ML IV EMUL
INTRAVENOUS | Status: DC | PRN
  Administered 2021-05-10: 100 ug/kg/min via INTRAVENOUS

## 2021-05-10 MED ORDER — LIDOCAINE-EPINEPHRINE 1 %-1:100000 IJ SOLN
INTRAMUSCULAR | Status: AC
  Filled 2021-05-10: qty 1

## 2021-05-10 MED ORDER — CHLORHEXIDINE GLUCONATE 0.12 % MT SOLN
15.0000 mL | OROMUCOSAL | Status: AC
  Administered 2021-05-10: 15 mL via OROMUCOSAL
  Filled 2021-05-10: qty 15

## 2021-05-10 MED ORDER — MIDAZOLAM HCL 2 MG/2ML IJ SOLN: 2.0000 mg | Freq: Once | INTRAMUSCULAR | Status: AC

## 2021-05-10 MED ORDER — HEPARIN 6000 UNIT IRRIGATION SOLUTION
Status: DC | PRN
  Administered 2021-05-10: 1

## 2021-05-10 MED ORDER — FENTANYL CITRATE (PF) 250 MCG/5ML IJ SOLN
INTRAMUSCULAR | Status: AC
  Filled 2021-05-10: qty 5

## 2021-05-10 MED ORDER — FENTANYL CITRATE (PF) 100 MCG/2ML IJ SOLN
INTRAMUSCULAR | Status: AC
  Administered 2021-05-10: 100 ug via INTRAVENOUS
  Filled 2021-05-10: qty 2

## 2021-05-10 MED ORDER — MIDAZOLAM HCL 2 MG/2ML IJ SOLN
INTRAMUSCULAR | Status: AC
  Administered 2021-05-10: 2 mg via INTRAVENOUS
  Filled 2021-05-10: qty 2

## 2021-05-10 SURGICAL SUPPLY — 32 items
ADH SKN CLS APL DERMABOND .7 (GAUZE/BANDAGES/DRESSINGS) ×1
ARMBAND PINK RESTRICT EXTREMIT (MISCELLANEOUS) ×2 IMPLANT
BAG COUNTER SPONGE SURGICOUNT (BAG) ×2 IMPLANT
BAG SPNG CNTER NS LX DISP (BAG) ×1
CANISTER SUCT 3000ML PPV (MISCELLANEOUS) ×2 IMPLANT
CLIP LIGATING EXTRA MED SLVR (CLIP) ×2 IMPLANT
CLIP LIGATING EXTRA SM BLUE (MISCELLANEOUS) ×2 IMPLANT
CLIP VESOCCLUDE MED 6/CT (CLIP) IMPLANT
CLIP VESOCCLUDE SM WIDE 6/CT (CLIP) IMPLANT
COVER PROBE W GEL 5X96 (DRAPES) IMPLANT
DERMABOND ADVANCED (GAUZE/BANDAGES/DRESSINGS) ×1
DERMABOND ADVANCED .7 DNX12 (GAUZE/BANDAGES/DRESSINGS) ×1 IMPLANT
ELECT REM PT RETURN 9FT ADLT (ELECTROSURGICAL) ×2
ELECTRODE REM PT RTRN 9FT ADLT (ELECTROSURGICAL) ×1 IMPLANT
GLOVE SURG ENC MOIS LTX SZ7.5 (GLOVE) ×2 IMPLANT
GOWN STRL REUS W/ TWL LRG LVL3 (GOWN DISPOSABLE) ×2 IMPLANT
GOWN STRL REUS W/ TWL XL LVL3 (GOWN DISPOSABLE) ×1 IMPLANT
GOWN STRL REUS W/TWL LRG LVL3 (GOWN DISPOSABLE) ×4
GOWN STRL REUS W/TWL XL LVL3 (GOWN DISPOSABLE) ×2
INSERT FOGARTY SM (MISCELLANEOUS) IMPLANT
KIT BASIN OR (CUSTOM PROCEDURE TRAY) ×2 IMPLANT
KIT TURNOVER KIT B (KITS) ×2 IMPLANT
NS IRRIG 1000ML POUR BTL (IV SOLUTION) ×2 IMPLANT
PACK CV ACCESS (CUSTOM PROCEDURE TRAY) ×2 IMPLANT
PAD ARMBOARD 7.5X6 YLW CONV (MISCELLANEOUS) ×2 IMPLANT
SUT MNCRL AB 4-0 PS2 18 (SUTURE) ×2 IMPLANT
SUT PROLENE 6 0 BV (SUTURE) ×2 IMPLANT
SUT VIC AB 3-0 SH 27 (SUTURE) ×2
SUT VIC AB 3-0 SH 27X BRD (SUTURE) ×1 IMPLANT
TOWEL GREEN STERILE (TOWEL DISPOSABLE) ×2 IMPLANT
UNDERPAD 30X36 HEAVY ABSORB (UNDERPADS AND DIAPERS) ×2 IMPLANT
WATER STERILE IRR 1000ML POUR (IV SOLUTION) ×2 IMPLANT

## 2021-05-10 NOTE — Discharge Instructions (Signed)
° °  [facility] ° °Discharge Instructions ° °AV Fistula or Graft Surgery for Dialysis Access ° °Please refer to the following instructions for your post-procedure care. Your surgeon or physician assistant will discuss any changes with you. ° °Activity ° °You may drive the day following your surgery, if you are comfortable and no longer taking prescription pain medication. Resume full activity as the soreness in your incision resolves. ° °Bathing/Showering ° °You may shower after you go home. Keep your incision dry for 48 hours. Do not soak in a bathtub, hot tub, or swim until the incision heals completely. You may not shower if you have a hemodialysis catheter. ° °Incision Care ° °Clean your incision with mild soap and water after 48 hours. Pat the area dry with a clean towel. You do not need a bandage unless otherwise instructed. Do not apply any ointments or creams to your incision. You may have skin glue on your incision. Do not peel it off. It will come off on its own in about one week. Your arm may swell a bit after surgery. To reduce swelling use pillows to elevate your arm so it is above your heart. Your doctor will tell you if you need to lightly wrap your arm with an ACE bandage. ° °Diet ° °Resume your normal diet. There are not special food restrictions following this procedure. In order to heal from your surgery, it is CRITICAL to get adequate nutrition. Your body requires vitamins, minerals, and protein. Vegetables are the best source of vitamins and minerals. Vegetables also provide the perfect balance of protein. Processed food has little nutritional value, so try to avoid this. ° °Medications ° °Resume taking all of your medications. If your incision is causing pain, you may take over-the counter pain relievers such as acetaminophen (Tylenol). If you were prescribed a stronger pain medication, please be aware these medications can cause nausea and constipation. Prevent  nausea by taking the medication with a snack or meal. Avoid constipation by drinking plenty of fluids and eating foods with high amount of fiber, such as fruits, vegetables, and grains. Do not take Tylenol if you are taking prescription pain medications. ° ° ° ° °Follow up °Your surgeon may want to see you in the office following your access surgery. If so, this will be arranged at the time of your surgery. ° °Please call us immediately for any of the following conditions: ° °Increased pain, redness, drainage (pus) from your incision site °Fever of 101 degrees or higher °Severe or worsening pain at your incision site °Hand pain or numbness. ° °Reduce your risk of vascular disease: ° °Stop smoking. If you would like help, call [facility] at 1-800-QUIT-NOW (1-800-784-8669) or [facility] at 336-586-4000 ° °Manage your cholesterol °Maintain a desired weight °Control your diabetes °Keep your blood pressure down ° °Dialysis ° °It will take several weeks to several months for your new dialysis access to be ready for use. Your surgeon will determine when it is OK to use it. Your nephrologist will continue to direct your dialysis. You can continue to use your Permcath until your new access is ready for use. ° °If you have any questions, please call the office at [phone]. ° °

## 2021-05-10 NOTE — Op Note (Signed)
    Patient name: Alexander [name] MRN: [id number] DOB: 03-31-1961 Sex: male  05/10/2021 Pre-operative Diagnosis: esrd Post-operative diagnosis:  Same Surgeon:  Erlene Quan C. Donzetta Matters, MD Assistant: Laurence Slate, PA Procedure Performed: Right brachial artery to basilic vein AV fistula creation  Indications: 60 year old male with end-stage renal disease currently dialyzing via catheter.  He previously had undergone right cephalic vein AV fistula creation which was found to be thrombosed.  He is now indicated for basilic vein fistula versus graft creation.  Assistant was necessary to expedite the case.  Findings: There was a large right basilic vein above the antecubitum.  This was easily dilated up to 4 mm.  I anastomose it to the area of the previous cephalic vein anastomosis and at completion there was a very strong thrill and a strong ulnar artery signal at the wrist that did not augment with compression of the fistula.   Procedure:  The patient was identified in the holding area and taken to the operating room where he was placed supine operative table and MAC anesthesia was induced.  A preoperative block of been placed.  He was sterilely prepped draped in the right upper extremity usual fashion, antibiotics were administered and a timeout was called.  Ultrasound was used we identified a very large basilic vein above the antecubitum extending down to the antecubital crease.  The block was tested.  We opened his previous incision.  We dissected out the previous fistula as well as the artery proximally distally.  We placed Vesseloops around the artery.  Through the same incision we then identified the nerve and vein.  The nerve was protected.  The vein was dissected out for approximately 8 cm marked for orientation.  We clamped distally and tied it off.  We then spatulated the vein serially dilated up to 4 mm which was dilated easily.  Was flushed heparinized saline and clamped.  The arteries clamped distally  proximally.  We transected the previous fistula at the level of the anastomosis.  We cleaned up the previous anastomosis site.  We then sewed the vein into the old anastomosis site with 6-0 Prolene suture.  Prior completion of flushing directions.  Upon completion there was a very strong thrill in the vein.  We confirmed flow with Doppler.  There was a good signal at the wrist on the ulnar artery that did not augment with compression of the vein.  We then irrigated the wound and obtain pneumostasis and closed the wound in layers with Vicryl and Monocryl.  Dermabond was placed at the level of skin.  He was awakened from anesthesia having tolerated procedure without any complication.  Counts were correct at completion of   EBL: 25cc  Wynnie Pacetti C. Donzetta Matters, MD [facility] Office: [phone] Pager: [phone]

## 2021-05-10 NOTE — Anesthesia Procedure Notes (Signed)
Anesthesia Regional Block: Supraclavicular block   Pre-Anesthetic Checklist: , timeout performed,  Correct Patient, Correct Site, Correct Laterality,  Correct Procedure, Correct Position, site marked,  Risks and benefits discussed,  Surgical consent,  Pre-op evaluation,  At surgeon's request and post-op pain management  Laterality: Right  Prep: chloraprep       Needles:  Injection technique: Single-shot  Needle Type: Echogenic Stimulator Needle     Needle Length: 5cm  Needle Gauge: 22     Additional Needles:   Procedures:, nerve stimulator,,, ultrasound used (permanent image in chart),,     Nerve Stimulator or Paresthesia:  Response: hand, 0.45 mA  Additional Responses:   Narrative:  Start time: 05/10/2021 11:31 AM End time: 05/10/2021 11:36 AM Injection made incrementally with aspirations every 5 mL.  Performed by: Personally  Anesthesiologist: Janeece Riggers, MD  Additional Notes: Functioning IV was confirmed and monitors were applied.  A 2mm 22ga Arrow echogenic stimulator needle was used. Sterile prep and drape,hand hygiene and sterile gloves were used. Ultrasound guidance: relevant anatomy identified, needle position confirmed, local anesthetic spread visualized around nerve(s)., vascular puncture avoided.  Image printed for medical record. Negative aspiration and negative test dose prior to incremental administration of local anesthetic. The patient tolerated the procedure well.

## 2021-05-10 NOTE — Anesthesia Preprocedure Evaluation (Signed)
Anesthesia Evaluation  Patient identified by MRN, date of birth, ID band Patient awake    Reviewed: Allergy & Precautions, NPO status , Patient's Chart, lab work & pertinent test results  Airway Mallampati: II  TM Distance: >3 FB Neck ROM: Full    Dental  (+) Teeth Intact, Dental Advisory Given   Pulmonary neg pulmonary ROS, former smoker,    Pulmonary exam normal        Cardiovascular hypertension, Pt. on medications and Pt. on home beta blockers + CAD and + Peripheral Vascular Disease  + dysrhythmias Atrial Fibrillation + pacemaker  Rhythm:Regular Rate:Normal     Neuro/Psych PSYCHIATRIC DISORDERS negative neurological ROS     GI/Hepatic Neg liver ROS, GERD  Medicated,  Endo/Other  diabetes, Type 2, Insulin Dependent  Renal/GU ESRFRenal disease  negative genitourinary   Musculoskeletal  (+) Arthritis ,   Abdominal (+)  Abdomen: soft. Bowel sounds: normal.  Peds  Hematology  (+) Blood dyscrasia, anemia ,   Anesthesia Other Findings   Reproductive/Obstetrics                             Anesthesia Physical  Anesthesia Plan  ASA: 3  Anesthesia Plan: Regional and MAC   Post-op Pain Management: GA combined w/ Regional for post-op pain   Induction: Intravenous  PONV Risk Score and Plan: Ondansetron, Aprepitant, Propofol infusion, Midazolam and Treatment may vary due to age or medical condition  Airway Management Planned: Simple Face Mask, Natural Airway and Nasal Cannula  Additional Equipment: None  Intra-op Plan:   Post-operative Plan:   Informed Consent: I have reviewed the patients History and Physical, chart, labs and discussed the procedure including the risks, benefits and alternatives for the proposed anesthesia with the patient or authorized representative who has indicated his/her understanding and acceptance.     Dental advisory given  Plan Discussed with: CRNA and  Anesthesiologist  Anesthesia Plan Comments:         Anesthesia Quick Evaluation

## 2021-05-10 NOTE — H&P (Signed)
HPI:   Alexander [name] is a 60 y.o. male history of end-stage renal disease now on dialysis Tuesdays, Thursdays and Saturdays.  He is on Eliquis for atrial fibrillation.  Recently had right arm fistula placed by Dr. Scot Dock due to left-sided pacemaker.  He is right-hand dominant.  Fistula is failed to mature as identified in dialysis he is now sent for further evaluation.       Past Medical History:  Diagnosis Date   Alcohol abuse     Anemia     Arthritis      "patient does not think so."   Atrial fibrillation ([place])     Cardiac syncope 10/07/14    rapid A fib with 8 sec pauses on converison with syncope- temp pacing wire placed then PPM   Cataract      BILATERAL   Coronary artery disease     Diabetes mellitus      dx---been  awhile   Dysrhythmia     Encounter for care of pacemaker 10/08/2014   ESRD (end stage renal disease) ([place])      [place]   History of blood transfusion     Hyperlipemia     Hypertension     Osteomyelitis (HCC)      right foot   Peripheral vascular disease ([place])     Presence of permanent cardiac pacemaker 10/08/2014    [employer]   Sinus node dysfunction ([place]) 10/08/2014         Family History  Problem Relation Age of Onset   Diabetes type II Mother     Hypertension Mother     Diabetes Mother     Liver cancer Father     Diabetes type II Sister     Breast cancer Sister     Diabetes Sister     Hypertension Sister     Diabetes type II Brother     Kidney failure Brother     Diabetes Brother     Hypertension Brother     Diabetes type II Sister           Past Surgical History:  Procedure Laterality Date   ABDOMINAL AORTOGRAM W/LOWER EXTREMITY N/A 09/09/2017    Procedure: ABDOMINAL AORTOGRAM W/LOWER EXTREMITY;  Surgeon: Angelia Mould, MD;  Location: [facility];  Service: Cardiovascular;  Laterality: N/A;   ABDOMINAL AORTOGRAM W/LOWER EXTREMITY Right 11/11/2019    Procedure: ABDOMINAL AORTOGRAM W/LOWER  EXTREMITY;  Surgeon: Marty Heck, MD;  Location: [place] [facility];  Service: Cardiovascular;  Laterality: Right;   AMPUTATION Left 08/19/2013    Procedure: AMPUTATION RAY;  Surgeon: Alta Corning, MD;  Location: [place] [facility];  Service: Orthopedics;  Laterality: Left;  ray amputation left 5th   AMPUTATION Left 10/27/2013    Procedure: AMPUTATION DIGIT-LEFT 4TH TOE, 4th and 5th metatarsal.;  Surgeon: Angelia Mould, MD;  Location: [place];  Service: Vascular;  Laterality: Left;   AMPUTATION Left 11/04/2013    Procedure: LEFT FOOT TRANS-METATARSAL AMPUTATION WITH WOUND CLOSURE ;  Surgeon: Alta Corning, MD;  Location: [place];  Service: Orthopedics;  Laterality: Left;   AMPUTATION Right 09/10/2017    Procedure: RIGHT FOURTH AND FIFTH TOE AMPUTATION;  Surgeon: Angelia Mould, MD;  Location: [place];  Service: Vascular;  Laterality: Right;   AMPUTATION Right 02/19/2020    Procedure: AMPUTATION RIGHT 3RD TOE;  Surgeon: Angelia Mould, MD;  Location: [facility];  Service: Vascular;  Laterality: Right;  AMPUTATION Right 03/11/2020    Procedure: RIGHT BELOW KNEE AMPUTATION;  Surgeon: Duda, Marcus V, MD;  [facility];  Service: Orthopedics;  Laterality: Right;   APPLICATION OF WOUND VAC Right 02/19/2020    Procedure: Application Of Wound Vac Right foot;  Surgeon: Dickson, [name], MD;  [facility];  Service: Vascular;  Laterality: Right;   AV FISTULA PLACEMENT Right 03/27/2021    Procedure: RIGHT ARM ARTERIOVENOUS (AV) FISTULA;  Surgeon: Dickson, [name], MD;  [facility];  Service: Vascular;  Laterality: Right;   BIOPSY   07/20/2020    Procedure: BIOPSY;  Surgeon: Mansouraty, Gabriel Jr., MD;  [facility];  Service: Gastroenterology;;   CARDIAC CATHETERIZATION   10/07/2014    Procedure: LEFT HEART CATH AND CORONARY ANGIOGRAPHY;  Surgeon: Jagadeesh R Ganji, MD;  [facility];  Service: Cardiovascular;;   COLONOSCOPY W/ POLYPECTOMY       COLONOSCOPY WITH  PROPOFOL N/A 05/25/2020    Procedure: COLONOSCOPY WITH PROPOFOL;  Surgeon: Gessner, Carl E, MD;  [facility];  Service: Endoscopy;  Laterality: N/A;   EMBOLECTOMY Right 09/12/2017    Procedure: Thrombectomy  & Redo Right Below Knee Popliteal Artery Bypass Graft  ;  Surgeon: Cain, Brandon Christopher, MD;  [facility];  Service: Vascular;  Laterality: Right;   ENTEROSCOPY N/A 07/20/2020    Procedure: ENTEROSCOPY;  Surgeon: Mansouraty, Gabriel Jr., MD;  [facility];  Service: Gastroenterology;  Laterality: N/A;   ESOPHAGOGASTRODUODENOSCOPY (EGD) WITH PROPOFOL N/A 05/24/2020    Procedure: ESOPHAGOGASTRODUODENOSCOPY (EGD) WITH PROPOFOL;  Surgeon: Stark, Malcolm T, MD;  [facility];  Service: Endoscopy;  Laterality: N/A;   EYE SURGERY Bilateral      cataract   FEMORAL-TIBIAL BYPASS GRAFT Left 10/27/2013    Procedure: BYPASS GRAFT LEFT FEMORAL- POSTERIOR TIBIAL ARTERY;  Surgeon: [name] Dickson, MD;  [facility];  Service: Vascular;  Laterality: Left;   FEMORAL-TIBIAL BYPASS GRAFT Right 09/10/2017    Procedure: RIGHT SUPERFICIAL  FEMORAL ARTERY-BELOW KNEE POPLITEAL ARTERY BYPASS GRAFT WITH VEIN;  Surgeon: Dickson, [name], MD;  [facility];  Service: Vascular;  Laterality: Right;   GIVENS CAPSULE STUDY N/A 07/20/2020    Procedure: GIVENS CAPSULE STUDY;  Surgeon: Mansouraty, Gabriel Jr., MD;  [facility];  Service: Gastroenterology;  Laterality: N/A;  capsule placed through scope into duodenum @ 0936   I & D EXTREMITY Left 10/27/2013    Procedure: IRRIGATION AND DEBRIDEMENT EXTREMITY- LEFT FOOT;  Surgeon: [name] Dickson, MD;  [facility];  Service: Vascular;  Laterality: Left;   I & D EXTREMITY Right 01/26/2020    Procedure: IRRIGATION AND DEBRIDEMENT EXTREMITY RIGHT FOOT WOUND;  Surgeon: Dickson, [name], MD;  [facility];  Service: Vascular;  Laterality: Right;   INTRAOPERATIVE ARTERIOGRAM Right 09/10/2017    Procedure:  INTRA OPERATIVE ARTERIOGRAM;  Surgeon: Dickson, [name], MD;  [facility];  Service: Vascular;  Laterality: Right;   IR ANGIOGRAM FOLLOW UP STUDY   09/09/2017   IR FLUORO GUIDE CV LINE RIGHT   05/31/2020   IR US GUIDE VASC ACCESS RIGHT   06/01/2020   LEFT HEART CATH AND CORONARY ANGIOGRAPHY N/A 08/19/2020    Procedure: LEFT HEART CATH AND CORONARY ANGIOGRAPHY;  Surgeon: Ganji, Jay, MD;  [facility];  Service: Cardiovascular;  Laterality: N/A;   LEFT HEART CATHETERIZATION WITH CORONARY ANGIOGRAM N/A 11/09/2014    Procedure: LEFT HEART CATHETERIZATION WITH CORONARY ANGIOGRAM;  Surgeon: Jagadeesh R Ganji,   MD;  [facility];  Service: Cardiovascular;  Laterality: N/A;   LOWER EXTREMITY ANGIOGRAM Right 11/08/2015    Procedure: Lower Extremity Angiogram;  Surgeon: Vance W Brabham, MD;  [facility];  Service: Cardiovascular;  Laterality: Right;   LUMBAR LAMINECTOMY   06/13/2012    Procedure: MICRODISCECTOMY LUMBAR LAMINECTOMY;  Surgeon: James E Nitka, MD;  [facility];  Service: Orthopedics;  Laterality: N/A;  Central laminectomy L2-3, L3-4, L4-5   PACEMAKER INSERTION   10/08/14    MDT Advisa MRI compatible dual chamber pacemaker implanted by Dr Klein for syncope with post-termination pauses   PERCUTANEOUS CORONARY STENT INTERVENTION (PCI-S)   11/09/2014    des to lad & distal circumflex         Dr  Ganji   PERIPHERAL VASCULAR BALLOON ANGIOPLASTY Right 11/11/2019    Procedure: PERIPHERAL VASCULAR BALLOON ANGIOPLASTY;  Surgeon: Clark, Christopher J, MD;  [facility];  Service: Cardiovascular;  Laterality: Right;  below knee popliteal, tibioperoneal trunk, posterior tibial arteries   PERIPHERAL VASCULAR CATHETERIZATION N/A 11/08/2015    Procedure: Abdominal Aortogram;  Surgeon: Vance W Brabham, MD;  [facility];  Service: Cardiovascular;  Laterality: N/A;   PERIPHERAL VASCULAR CATHETERIZATION Right 11/08/2015     Procedure: Peripheral Vascular Atherectomy;  Surgeon: Vance W Brabham, MD;  [facility];  Service: Cardiovascular;  Laterality: Right;   PERIPHERAL VASCULAR CATHETERIZATION N/A 10/10/2016    Procedure: Lower Extremity Angiography;  Surgeon: Brandon Christopher Cain, MD;  [facility];  Service: Cardiovascular;  Laterality: N/A;   PERIPHERAL VASCULAR CATHETERIZATION Right 10/10/2016    Procedure: Peripheral Vascular Atherectomy;  Surgeon: Brandon Christopher Cain, MD;  [facility];  Service: Cardiovascular;  Laterality: Right;  Popliteal   PERMANENT PACEMAKER INSERTION N/A 10/08/2014    Procedure: PERMANENT PACEMAKER INSERTION;  Surgeon: Steven C Klein, MD;  [facility];  Service: Cardiovascular;  Laterality: N/A;   SUBMUCOSAL TATTOO INJECTION   07/20/2020    Procedure: SUBMUCOSAL TATTOO INJECTION;  Surgeon: Mansouraty, Gabriel Jr., MD;  [facility];  Service: Gastroenterology;;   TEMPORARY PACEMAKER INSERTION Bilateral 10/07/2014    Procedure: TEMPORARY PACEMAKER INSERTION;  Surgeon: Jagadeesh R Ganji, MD;  [facility];  Service: Cardiovascular;  Laterality: Bilateral;   WOUND DEBRIDEMENT Right 02/19/2020    Procedure: DEBRIDEMENT WOUND RIGHT FOOT;  Surgeon: Dickson, [name], MD;  [facility];  Service: Vascular;  Laterality: Right;      Short Social History:  Social History         Tobacco Use   Smoking status: Former Smoker      Packs/day: 1.50      Years: 30.00      Pack years: 45.00      Types: Cigarettes      Quit date: 10/07/2014      Years since quitting: 6.5   Smokeless tobacco: Never Used  Substance Use Topics   Alcohol use: Yes      Alcohol/week: 21.0 standard drinks      Types: 21 Glasses of wine per week           Allergies  Allergen Reactions   Cytoxan [Cyclophosphamide]        pancytopenia   Keflex [Cephalexin] Nausea And Vomiting   Pletal [Cilostazol] Palpitations      "Can  hear heart beating loudly".   Sulfa Antibiotics Rash      Other reaction(s): Unknown              Current Outpatient Medications  Medication Sig Dispense Refill   ACCU-CHEK FASTCLIX LANCETS MISC Check blood sugar TID & QHS 102 each 2   amLODipine (NORVASC) 5 MG tablet Take 1 tablet (5 mg total) by mouth daily. 90 tablet 3   atorvastatin (LIPITOR) 20 MG tablet Take 1 tablet by mouth once daily (Patient taking differently: Take 20 mg by mouth daily. pm) 90 tablet 0   blood glucose meter kit and supplies KIT Check blood sugar TID & QHS 1 each 0   Blood Glucose Monitoring Suppl (ACCU-CHEK ADVANTAGE DIABETES) kit Use as instructed 1 each 0   furosemide (LASIX) 80 MG tablet Take 80 mg by mouth See admin instructions. Mon., Wed, Fri, and sunday       glucose blood (ACCU-CHEK AVIVA PLUS) test strip Use as instructed 100 each 12   glucose blood (CHOICE DM FORA G20 TEST STRIPS) test strip Check blood sugar TID & QHS 100 each 12   glucose blood test strip Use as instructed 100 each 12   hydrALAZINE (APRESOLINE) 100 MG tablet Take 1 tablet (100 mg total) by mouth 2 (two) times daily. 180 tablet 3   insulin NPH-regular Human (70-30) 100 UNIT/ML injection Inject 25 Units into the skin 2 (two) times daily with a meal. (Patient taking differently: Inject 80 Units into the skin 2 (two) times daily with a meal.) 10 mL 11   metoprolol tartrate (LOPRESSOR) 100 MG tablet Take 1 tablet (100 mg total) by mouth 2 (two) times daily. (Patient taking differently: Take 25 mg by mouth 2 (two) times daily.) 180 tablet 3   Multiple Vitamin (MULTIVITAMIN ADULT) TABS Take 1 tablet by mouth daily.       oxyCODONE (ROXICODONE) 5 MG immediate release tablet Take 1 tablet (5 mg total) by mouth every 4 (four) hours as needed. 15 tablet 0   pantoprazole (PROTONIX) 40 MG tablet Take 40 mg by mouth daily. Patient first said he is not taking this and changed his mind, he is taking it.       pantoprazole (PROTONIX) 40 MG tablet Take 40  mg by mouth daily.       prazosin (MINIPRESS) 2 MG capsule TAKE 1 CAPSULE BY MOUTH ONCE DAILY AFTER SUPPER 90 capsule 0   apixaban (ELIQUIS) 5 MG TABS tablet Take 1 tablet (5 mg total) by mouth 2 (two) times daily. 180 tablet 0    No current facility-administered medications for this visit.      Review of Systems  Constitutional:  Constitutional negative. HENT: HENT negative. Eyes: Eyes negative. Respiratory: Respiratory negative. Cardiovascular: Cardiovascular negative. Musculoskeletal: Musculoskeletal negative. Skin: Skin negative. Neurological: Neurological negative. Hematologic: Hematologic/lymphatic negative. Psychiatric: Psychiatric negative.    No complaints related to today's visit    Objective:   Vitals:   05/10/21 1029  BP: (!) 197/82  Pulse: 87  Resp: 18  Temp: 98 F (36.7 C)  SpO2: 100%      Physical Exam HENT:    Head: Normocephalic.     Nose:     Comments: Wearing a mask Eyes:    Pupils: Pupils are equal, round, and reactive to light.  Cardiovascular:    Pulses:          Radial pulses are 2+ on the right side and 2+ on the left side.  Pulmonary:    Effort: Pulmonary effort is normal.  Abdominal:     General: Abdomen is flat. Musculoskeletal:     Comments: Right below-knee amputation  Right upper extremity wounds  are healing well  There is pulsatility throughout his fistula in the upper arm it is very faint above the second incision  Skin:    Capillary Refill: Capillary refill takes less than 2 seconds.  Neurological:    General: No focal deficit present.     Mental Status: He is alert.  Psychiatric:        Mood and Affect: Mood normal.        Thought Content: Thought content normal.        Judgment: Judgment normal.       Data: +--------------------+----------+-----------------+---------------+  AVF                 PSV (cm/s)Flow Vol (mL/min)   Comments      +--------------------+----------+-----------------+---------------+   Native artery inflow   156           233       High resistance  +--------------------+----------+-----------------+---------------+  AVF Anastomosis         82                                      +--------------------+----------+-----------------+---------------+      +------------+----------+-------------+----------+--------+  OUTFLOW VEINPSV (cm/s)Diameter (cm)Depth (cm)Describe  +------------+----------+-------------+----------+--------+  Shoulder        44                                     +------------+----------+-------------+----------+--------+  Prox UA         40        0.32        1.39             +------------+----------+-------------+----------+--------+  Mid UA         180        0.42        0.51             +------------+----------+-------------+----------+--------+  Dist UA        126        0.36        0.61             +------------+----------+-------------+----------+--------+  AC Fossa       609        0.88        0.99   stenotic  +------------+----------+-------------+----------+--------+  Stenosis is 2.6 cm from AVF anastomosis         Summary:  Arteriovenous fistula-Stenosis noted in the distal upper arm.     Assessment/Plan:    60-year-old male with end-stage renal disease dialyzing via catheter.  He has previously placed cephalic vein fistula on the right which is failed to mature does appear to have a stenosis just after the anastomosis.  May be that this fistula is not salvageable there is no preoperative vein mapping performed Dr. Dickson performed on table duplex.  We will plan for right arm avf vs avg in OR today.   Brandon C. Cain, MD [facility] Office: [phone] Pager: [phone]    

## 2021-05-10 NOTE — Anesthesia Postprocedure Evaluation (Signed)
Anesthesia Post Note  Patient: Alexander [name]  Procedure(s) Performed: RIGHT ARM ARTERIOVENOUS (AV) FISTULA (Right)     Patient location during evaluation: PACU Anesthesia Type: Regional Level of consciousness: awake and alert Pain management: pain level controlled Vital Signs Assessment: post-procedure vital signs reviewed and stable Respiratory status: spontaneous breathing, nonlabored ventilation, respiratory function stable and patient connected to nasal cannula oxygen Cardiovascular status: stable and blood pressure returned to baseline Postop Assessment: no apparent nausea or vomiting Anesthetic complications: no   No notable events documented.  Last Vitals:  Vitals:   05/10/21 1330 05/10/21 1345  BP: 118/72 (!) 149/49  Pulse: 72 73  Resp: 18 20  Temp:  36.6 C  SpO2: 100% 98%    Last Pain:  Vitals:   05/10/21 1345  TempSrc:   PainSc: 0-No pain                 Wael Maestas COKER

## 2021-05-10 NOTE — Anesthesia Procedure Notes (Signed)
Procedure Name: MAC Date/Time: 05/10/2021 12:00 PM Performed by: Moshe Salisbury, CRNA Pre-anesthesia Checklist: Patient identified, Emergency Drugs available, Suction available, Patient being monitored and Timeout performed Oxygen Delivery Method: Nasal cannula Placement Confirmation: positive ETCO2 Dental Injury: Teeth and Oropharynx as per pre-operative assessment

## 2021-05-10 NOTE — Progress Notes (Signed)
Orthopedic Tech Progress Note Patient Details:  Alexander [name] Aug 18, 1961 [id number]  PACU RN requesting an [place] for patient   Ortho Devices Type of Ortho Device: Arm sling Ortho Device/Splint Location: RUE Ortho Device/Splint Interventions: Application, Adjustment   Post Interventions Patient Tolerated: Well Instructions Provided: Care of [place] 05/10/2021, 2:23 PM

## 2021-05-10 NOTE — Transfer of Care (Signed)
Immediate Anesthesia Transfer of Care Note  Patient: [name]  Procedure(s) Performed: RIGHT ARM ARTERIOVENOUS (AV) FISTULA (Right)  Patient Location: PACU  Anesthesia Type:MAC combined with regional for post-op pain  Level of Consciousness: awake and patient cooperative  Airway & Oxygen Therapy: Patient Spontanous Breathing and Patient connected to nasal cannula oxygen  Post-op Assessment: Report given to RN, Post -op Vital signs reviewed and stable and Patient moving all extremities  Post vital signs: Reviewed and stable  Last Vitals:  Vitals Value Taken Time  BP    Temp    Pulse 74 05/10/21 1313  Resp 18 05/10/21 1313  SpO2 96 % 05/10/21 1313  Vitals shown include unvalidated device data.  Last Pain:  Vitals:   05/10/21 1046  TempSrc:   PainSc: 0-No pain      Patients Stated Pain Goal: 0 (68/16/61 [id number])  Complications: No notable events documented.

## 2021-05-11 ENCOUNTER — Encounter (HOSPITAL_COMMUNITY): Payer: Self-pay | Admitting: Vascular Surgery

## 2021-05-11 DIAGNOSIS — T8249XA Other complication of vascular dialysis catheter, initial encounter: Secondary | ICD-10-CM | POA: Diagnosis not present

## 2021-05-11 DIAGNOSIS — D509 Iron deficiency anemia, unspecified: Secondary | ICD-10-CM | POA: Diagnosis not present

## 2021-05-11 DIAGNOSIS — N179 Acute kidney failure, unspecified: Secondary | ICD-10-CM | POA: Diagnosis not present

## 2021-05-11 DIAGNOSIS — N2581 Secondary hyperparathyroidism of renal origin: Secondary | ICD-10-CM | POA: Diagnosis not present

## 2021-05-11 DIAGNOSIS — D689 Coagulation defect, unspecified: Secondary | ICD-10-CM | POA: Diagnosis not present

## 2021-05-11 DIAGNOSIS — Z992 Dependence on renal dialysis: Secondary | ICD-10-CM | POA: Diagnosis not present

## 2021-05-11 DIAGNOSIS — D631 Anemia in chronic kidney disease: Secondary | ICD-10-CM | POA: Diagnosis not present

## 2021-05-11 DIAGNOSIS — N186 End stage renal disease: Secondary | ICD-10-CM | POA: Diagnosis not present

## 2021-05-11 DIAGNOSIS — E0821 Diabetes mellitus due to underlying condition with diabetic nephropathy: Secondary | ICD-10-CM | POA: Diagnosis not present

## 2021-05-13 DIAGNOSIS — D689 Coagulation defect, unspecified: Secondary | ICD-10-CM | POA: Diagnosis not present

## 2021-05-13 DIAGNOSIS — Z992 Dependence on renal dialysis: Secondary | ICD-10-CM | POA: Diagnosis not present

## 2021-05-13 DIAGNOSIS — E0821 Diabetes mellitus due to underlying condition with diabetic nephropathy: Secondary | ICD-10-CM | POA: Diagnosis not present

## 2021-05-13 DIAGNOSIS — D631 Anemia in chronic kidney disease: Secondary | ICD-10-CM | POA: Diagnosis not present

## 2021-05-13 DIAGNOSIS — T8249XA Other complication of vascular dialysis catheter, initial encounter: Secondary | ICD-10-CM | POA: Diagnosis not present

## 2021-05-13 DIAGNOSIS — N186 End stage renal disease: Secondary | ICD-10-CM | POA: Diagnosis not present

## 2021-05-13 DIAGNOSIS — N2581 Secondary hyperparathyroidism of renal origin: Secondary | ICD-10-CM | POA: Diagnosis not present

## 2021-05-13 DIAGNOSIS — D509 Iron deficiency anemia, unspecified: Secondary | ICD-10-CM | POA: Diagnosis not present

## 2021-05-16 DIAGNOSIS — D689 Coagulation defect, unspecified: Secondary | ICD-10-CM | POA: Diagnosis not present

## 2021-05-16 DIAGNOSIS — D509 Iron deficiency anemia, unspecified: Secondary | ICD-10-CM | POA: Diagnosis not present

## 2021-05-16 DIAGNOSIS — N186 End stage renal disease: Secondary | ICD-10-CM | POA: Diagnosis not present

## 2021-05-16 DIAGNOSIS — E0821 Diabetes mellitus due to underlying condition with diabetic nephropathy: Secondary | ICD-10-CM | POA: Diagnosis not present

## 2021-05-16 DIAGNOSIS — T8249XA Other complication of vascular dialysis catheter, initial encounter: Secondary | ICD-10-CM | POA: Diagnosis not present

## 2021-05-16 DIAGNOSIS — Z992 Dependence on renal dialysis: Secondary | ICD-10-CM | POA: Diagnosis not present

## 2021-05-16 DIAGNOSIS — D631 Anemia in chronic kidney disease: Secondary | ICD-10-CM | POA: Diagnosis not present

## 2021-05-16 DIAGNOSIS — N2581 Secondary hyperparathyroidism of renal origin: Secondary | ICD-10-CM | POA: Diagnosis not present

## 2021-05-18 DIAGNOSIS — Z992 Dependence on renal dialysis: Secondary | ICD-10-CM | POA: Diagnosis not present

## 2021-05-18 DIAGNOSIS — D689 Coagulation defect, unspecified: Secondary | ICD-10-CM | POA: Diagnosis not present

## 2021-05-18 DIAGNOSIS — D631 Anemia in chronic kidney disease: Secondary | ICD-10-CM | POA: Diagnosis not present

## 2021-05-18 DIAGNOSIS — N186 End stage renal disease: Secondary | ICD-10-CM | POA: Diagnosis not present

## 2021-05-18 DIAGNOSIS — T8249XA Other complication of vascular dialysis catheter, initial encounter: Secondary | ICD-10-CM | POA: Diagnosis not present

## 2021-05-18 DIAGNOSIS — E0821 Diabetes mellitus due to underlying condition with diabetic nephropathy: Secondary | ICD-10-CM | POA: Diagnosis not present

## 2021-05-18 DIAGNOSIS — N2581 Secondary hyperparathyroidism of renal origin: Secondary | ICD-10-CM | POA: Diagnosis not present

## 2021-05-18 DIAGNOSIS — D509 Iron deficiency anemia, unspecified: Secondary | ICD-10-CM | POA: Diagnosis not present

## 2021-05-20 DIAGNOSIS — D631 Anemia in chronic kidney disease: Secondary | ICD-10-CM | POA: Diagnosis not present

## 2021-05-20 DIAGNOSIS — T8249XA Other complication of vascular dialysis catheter, initial encounter: Secondary | ICD-10-CM | POA: Diagnosis not present

## 2021-05-20 DIAGNOSIS — D509 Iron deficiency anemia, unspecified: Secondary | ICD-10-CM | POA: Diagnosis not present

## 2021-05-20 DIAGNOSIS — E0821 Diabetes mellitus due to underlying condition with diabetic nephropathy: Secondary | ICD-10-CM | POA: Diagnosis not present

## 2021-05-20 DIAGNOSIS — Z992 Dependence on renal dialysis: Secondary | ICD-10-CM | POA: Diagnosis not present

## 2021-05-20 DIAGNOSIS — D689 Coagulation defect, unspecified: Secondary | ICD-10-CM | POA: Diagnosis not present

## 2021-05-20 DIAGNOSIS — N186 End stage renal disease: Secondary | ICD-10-CM | POA: Diagnosis not present

## 2021-05-20 DIAGNOSIS — N2581 Secondary hyperparathyroidism of renal origin: Secondary | ICD-10-CM | POA: Diagnosis not present

## 2021-05-23 DIAGNOSIS — N186 End stage renal disease: Secondary | ICD-10-CM | POA: Diagnosis not present

## 2021-05-23 DIAGNOSIS — E0821 Diabetes mellitus due to underlying condition with diabetic nephropathy: Secondary | ICD-10-CM | POA: Diagnosis not present

## 2021-05-23 DIAGNOSIS — T8249XA Other complication of vascular dialysis catheter, initial encounter: Secondary | ICD-10-CM | POA: Diagnosis not present

## 2021-05-23 DIAGNOSIS — D689 Coagulation defect, unspecified: Secondary | ICD-10-CM | POA: Diagnosis not present

## 2021-05-23 DIAGNOSIS — D509 Iron deficiency anemia, unspecified: Secondary | ICD-10-CM | POA: Diagnosis not present

## 2021-05-23 DIAGNOSIS — Z992 Dependence on renal dialysis: Secondary | ICD-10-CM | POA: Diagnosis not present

## 2021-05-23 DIAGNOSIS — N2581 Secondary hyperparathyroidism of renal origin: Secondary | ICD-10-CM | POA: Diagnosis not present

## 2021-05-23 DIAGNOSIS — D631 Anemia in chronic kidney disease: Secondary | ICD-10-CM | POA: Diagnosis not present

## 2021-05-24 ENCOUNTER — Encounter: Payer: Self-pay | Admitting: Cardiology

## 2021-05-24 ENCOUNTER — Ambulatory Visit: Payer: Medicare Other | Admitting: Cardiology

## 2021-05-24 ENCOUNTER — Other Ambulatory Visit: Payer: Self-pay

## 2021-05-24 VITALS — BP 159/71 | HR 57 | Temp 98.3°F | Resp 17 | Ht 72.0 in | Wt 270.0 lb

## 2021-05-24 DIAGNOSIS — I739 Peripheral vascular disease, unspecified: Secondary | ICD-10-CM

## 2021-05-24 DIAGNOSIS — Z45018 Encounter for adjustment and management of other part of cardiac pacemaker: Secondary | ICD-10-CM

## 2021-05-24 DIAGNOSIS — I480 Paroxysmal atrial fibrillation: Secondary | ICD-10-CM

## 2021-05-24 DIAGNOSIS — I2510 Atherosclerotic heart disease of native coronary artery without angina pectoris: Secondary | ICD-10-CM | POA: Diagnosis not present

## 2021-05-24 DIAGNOSIS — I495 Sick sinus syndrome: Secondary | ICD-10-CM

## 2021-05-24 DIAGNOSIS — Z950 Presence of cardiac pacemaker: Secondary | ICD-10-CM | POA: Diagnosis not present

## 2021-05-24 DIAGNOSIS — I10 Essential (primary) hypertension: Secondary | ICD-10-CM

## 2021-05-24 MED ORDER — METOPROLOL TARTRATE 25 MG PO TABS: 25.0000 mg | ORAL_TABLET | Freq: Two times a day (BID) | ORAL | 3 refills | Status: DC

## 2021-05-24 NOTE — Progress Notes (Addendum)
Alexander [name] Date of Birth: [birth year] MRN: [id number] Primary Care Provider:Tisovec, Alexander Him, MD Primary Cardiologist: Adrian Prows, MD,  Chief Complaint  Patient presents with   Coronary Artery Disease   Hypertension    HPI  Alexander [name] is a 60 y.o. Caucasian male patient with hypertension, hyperlipidemia, diabetes mellitus with end-stage renal disease on hemodialysis, coronary artery disease with atherectomy followed by stenting to LAD and to the distal circumflex in 2015. Coronary angiography in 2021, sick sinus syndrome and also complete heart block and paroxysmal atrial fibrillation SP permanent Medtronic pacemaker implantation in 2015 PAD SP right BKA and remote femoropopliteal bypass surgery, she also has history of alcohol excess use.  Patient presents for 6 month follow up. Patient is presently doing well without specific complaints. Denies chest pain, swelling, palpitations, orthopnea, PND. Denies melena, blood in his urine or stool.   PAST MEDICAL HISTORY: Past Medical History:  Diagnosis Date   Alcohol abuse    Anemia    Arthritis    "patient does not think so."   Atrial fibrillation ([place])    Cardiac syncope 10/07/2014   rapid A fib with 8 sec pauses on converison with syncope- temp pacing wire placed then PPM   Cataract    BILATERAL   Coronary artery disease    Diabetes mellitus    dx---been  awhile   Dysrhythmia    Encounter for care of pacemaker 10/08/2014   ESRD (end stage renal disease) ([place])    [place]   GERD (gastroesophageal reflux disease)    History of blood transfusion    Hyperlipemia    Hypertension    Osteomyelitis ([place])    right foot   Peripheral vascular disease ([place])    Presence of permanent cardiac pacemaker 10/08/2014   Medtronic   Sinus node dysfunction ([place]) 10/08/2014    PAST SURGICAL HISTORY: Past Surgical History:  Procedure Laterality Date   A/V FISTULAGRAM Right 05/01/2021   Procedure: A/V FISTULAGRAM;   Surgeon: Waynetta Sandy, MD;  Location: [place] [facility];  Service: Cardiovascular;  Laterality: Right;  failed fistulagram   ABDOMINAL AORTOGRAM W/LOWER EXTREMITY N/A 09/09/2017   Procedure: ABDOMINAL AORTOGRAM W/LOWER EXTREMITY;  Surgeon: Angelia Mould, MD;  Location: [place] [facility];  Service: Cardiovascular;  Laterality: N/A;   ABDOMINAL AORTOGRAM W/LOWER EXTREMITY Right 11/11/2019   Procedure: ABDOMINAL AORTOGRAM W/LOWER EXTREMITY;  Surgeon: Marty Heck, MD;  Location: [place] [facility];  Service: Cardiovascular;  Laterality: Right;   AMPUTATION Left 08/19/2013   Procedure: AMPUTATION RAY;  Surgeon: Alta Corning, MD;  Location: [place];  Service: Orthopedics;  Laterality: Left;  ray amputation left 5th   AMPUTATION Left 10/27/2013   Procedure: AMPUTATION DIGIT-LEFT 4TH TOE, 4th and 5th metatarsal.;  Surgeon: Angelia Mould, MD;  Location: [place];  Service: Vascular;  Laterality: Left;   AMPUTATION Left 11/04/2013   Procedure: LEFT FOOT TRANS-METATARSAL AMPUTATION WITH WOUND CLOSURE ;  Surgeon: Alta Corning, MD;  Location: [place];  Service: Orthopedics;  Laterality: Left;   AMPUTATION Right 09/10/2017   Procedure: RIGHT FOURTH AND FIFTH TOE AMPUTATION;  Surgeon: Angelia Mould, MD;  Location: [place];  Service: Vascular;  Laterality: Right;   AMPUTATION Right 02/19/2020   Procedure: AMPUTATION RIGHT 3RD TOE;  Surgeon: Angelia Mould, MD;  Location: [place];  Service: Vascular;  Laterality: Right;   AMPUTATION Right 03/11/2020   Procedure: RIGHT BELOW KNEE AMPUTATION;  Surgeon: Newt Minion,  MD;  Location: [place];  Service: Orthopedics;  Laterality: Right;   APPLICATION OF WOUND VAC Right 02/19/2020   Procedure: Application Of Wound Vac Right foot;  Surgeon: Angelia Mould, MD;  Location: [place];  Service: Vascular;  Laterality: Right;   AV FISTULA PLACEMENT Right 03/27/2021   Procedure: RIGHT ARM ARTERIOVENOUS (AV) FISTULA;  Surgeon:  Angelia Mould, MD;  Location: [place];  Service: Vascular;  Laterality: Right;   AV FISTULA PLACEMENT Right 05/10/2021   Procedure: RIGHT ARM ARTERIOVENOUS (AV) FISTULA;  Surgeon: Waynetta Sandy, MD;  Location: [place];  Service: Vascular;  Laterality: Right;   BIOPSY  07/20/2020   Procedure: BIOPSY;  Surgeon: Irving Copas., MD;  Location: [place];  Service: Gastroenterology;;   CARDIAC CATHETERIZATION  10/07/2014   Procedure: LEFT HEART CATH AND CORONARY ANGIOGRAPHY;  Surgeon: Laverda Page, MD;  Location: [facility] C[facility] LAB;  Service: Cardiovascular;;   COLONOSCOPY W/ POLYPECTOMY     COLONOSCOPY WITH PROPOFOL N/A 05/25/2020   Procedure: COLONOSCOPY WITH PROPOFOL;  Surgeon: Gatha Mayer, MD;  Location: [facility];  Service: Endoscopy;  Laterality: N/A;   EMBOLECTOMY Right 09/12/2017   Procedure: Thrombectomy  & Redo Right Below Knee Popliteal Artery Bypass Graft  ;  Surgeon: Waynetta Sandy, MD;  Location: [place];  Service: Vascular;  Laterality: Right;   ENTEROSCOPY N/A 07/20/2020   Procedure: ENTEROSCOPY;  Surgeon: Rush Landmark Telford Nab., MD;  Location: [place];  Service: Gastroenterology;  Laterality: N/A;   ESOPHAGOGASTRODUODENOSCOPY (EGD) WITH PROPOFOL N/A 05/24/2020   Procedure: ESOPHAGOGASTRODUODENOSCOPY (EGD) WITH PROPOFOL;  Surgeon: Ladene Artist, MD;  Location: [facility];  Service: Endoscopy;  Laterality: N/A;   EYE SURGERY Bilateral    cataract   FEMORAL-TIBIAL BYPASS GRAFT Left 10/27/2013   Procedure: BYPASS GRAFT LEFT FEMORAL- POSTERIOR TIBIAL ARTERY;  Surgeon: Angelia Mould, MD;  Location: [place];  Service: Vascular;  Laterality: Left;   FEMORAL-TIBIAL BYPASS GRAFT Right 09/10/2017   Procedure: RIGHT SUPERFICIAL  FEMORAL ARTERY-BELOW KNEE POPLITEAL ARTERY BYPASS GRAFT WITH VEIN;  Surgeon: Angelia Mould, MD;  Location: [place];  Service: Vascular;  Laterality: Right;   GIVENS CAPSULE STUDY N/A 07/20/2020   Procedure:  GIVENS CAPSULE STUDY;  Surgeon: Irving Copas., MD;  Location: [place];  Service: Gastroenterology;  Laterality: N/A;  capsule placed through scope into duodenum @ 0936   I & D EXTREMITY Left 10/27/2013   Procedure: IRRIGATION AND DEBRIDEMENT EXTREMITY- LEFT FOOT;  Surgeon: Angelia Mould, MD;  Location: [place];  Service: Vascular;  Laterality: Left;   I & D EXTREMITY Right 01/26/2020   Procedure: IRRIGATION AND DEBRIDEMENT EXTREMITY RIGHT FOOT WOUND;  Surgeon: Angelia Mould, MD;  Location: [place];  Service: Vascular;  Laterality: Right;   INTRAOPERATIVE ARTERIOGRAM Right 09/10/2017   Procedure: INTRA OPERATIVE ARTERIOGRAM;  Surgeon: Angelia Mould, MD;  Location: [place];  Service: Vascular;  Laterality: Right;   IR ANGIOGRAM FOLLOW UP STUDY  09/09/2017   IR FLUORO GUIDE CV LINE RIGHT  05/31/2020   IR US GUIDE VASC ACCESS RIGHT  06/01/2020   LEFT HEART CATH AND CORONARY ANGIOGRAPHY N/A 08/19/2020   Procedure: LEFT HEART CATH AND CORONARY ANGIOGRAPHY;  Surgeon: Adrian Prows, MD;  Location: [place] [facility];  Service: Cardiovascular;  Laterality: N/A;   LEFT HEART CATHETERIZATION WITH CORONARY ANGIOGRAM N/A 11/09/2014   Procedure: LEFT HEART CATHETERIZATION WITH CORONARY ANGIOGRAM;  Surgeon: Laverda Page, MD;  Location: [facility] CATH LAB;  Service: Cardiovascular;  Laterality: N/A;   LOWER EXTREMITY ANGIOGRAM Right 11/08/2015   Procedure: Lower Extremity Angiogram;  Surgeon: Serafina Mitchell, MD;  Location: [place] [facility];  Service: Cardiovascular;  Laterality: Right;   LUMBAR LAMINECTOMY  06/13/2012   Procedure: MICRODISCECTOMY LUMBAR LAMINECTOMY;  Surgeon: Jessy Oto, MD;  Location: [place];  Service: Orthopedics;  Laterality: N/A;  Central laminectomy L2-3, L3-4, L4-5   PACEMAKER INSERTION  10/08/14   MDT Advisa MRI compatible dual chamber pacemaker implanted by Dr Caryl Comes for syncope with post-termination pauses   PERCUTANEOUS CORONARY STENT INTERVENTION (PCI-S)   11/09/2014   des to lad & distal circumflex         Dr  [name]   PERIPHERAL VASCULAR BALLOON ANGIOPLASTY Right 11/11/2019   Procedure: PERIPHERAL VASCULAR BALLOON ANGIOPLASTY;  Surgeon: Marty Heck, MD;  Location: [place] [facility];  Service: Cardiovascular;  Laterality: Right;  below knee popliteal, tibioperoneal trunk, posterior tibial arteries   PERIPHERAL VASCULAR CATHETERIZATION N/A 11/08/2015   Procedure: Abdominal Aortogram;  Surgeon: Serafina Mitchell, MD;  Location: [place] [facility];  Service: Cardiovascular;  Laterality: N/A;   PERIPHERAL VASCULAR CATHETERIZATION Right 11/08/2015   Procedure: Peripheral Vascular Atherectomy;  Surgeon: Serafina Mitchell, MD;  Location: [place] [facility];  Service: Cardiovascular;  Laterality: Right;   PERIPHERAL VASCULAR CATHETERIZATION N/A 10/10/2016   Procedure: Lower Extremity Angiography;  Surgeon: Waynetta Sandy, MD;  Location: [place] [facility];  Service: Cardiovascular;  Laterality: N/A;   PERIPHERAL VASCULAR CATHETERIZATION Right 10/10/2016   Procedure: Peripheral Vascular Atherectomy;  Surgeon: Waynetta Sandy, MD;  Location: [place] [facility];  Service: Cardiovascular;  Laterality: Right;  Popliteal   PERMANENT PACEMAKER INSERTION N/A 10/08/2014   Procedure: PERMANENT PACEMAKER INSERTION;  Surgeon: Deboraha Sprang, MD;  Location: [facility] CATH LAB;  Service: Cardiovascular;  Laterality: N/A;   SUBMUCOSAL TATTOO INJECTION  07/20/2020   Procedure: SUBMUCOSAL TATTOO INJECTION;  Surgeon: Irving Copas., MD;  Location: [place];  Service: Gastroenterology;;   TEMPORARY PACEMAKER INSERTION Bilateral 10/07/2014   Procedure: TEMPORARY PACEMAKER INSERTION;  Surgeon: Laverda Page, MD;  Location: [facility] C[facility] LAB;  Service: Cardiovascular;  Laterality: Bilateral;   WOUND DEBRIDEMENT Right 02/19/2020   Procedure: DEBRIDEMENT WOUND RIGHT FOOT;  Surgeon: Angelia Mould, MD;  Location: [facility] OR;  Service: Vascular;   Laterality: Right;    FAMILY HISTORY: The patient's family history includes Breast cancer in his sister; Diabetes in his brother, mother, and sister; Diabetes type II in his brother, mother, sister, and sister; Hypertension in his brother, mother, and sister; Kidney failure in his brother; Liver cancer in his father.   SOCIAL HISTORY:  Social History   Tobacco Use   Smoking status: Former    Packs/day: 1.50    Years: 30.00    Pack years: 45.00    Types: Cigarettes    Quit date: 10/07/2014    Years since quitting: 6.6   Smokeless tobacco: Never  Substance Use Topics   Alcohol use: Yes    Alcohol/week: 21.0 standard drinks    Types: 21 Glasses of wine per week     Review of Systems  Cardiovascular:  Negative for chest pain, dyspnea on exertion and leg swelling.  Gastrointestinal:  Negative for melena.   PHYSICAL EXAM: Vitals with BMI 05/24/2021 05/10/2021 05/10/2021  Height '6\' 0"'  - -  Weight 270 lbs - -  BMI 84.69 - -  Systolic 629 528 413  Diastolic 71 49 72  Pulse 57 73  72   Physical Exam Vitals reviewed.  Constitutional:      Comments: Appears older than stated age  Neck:     Vascular: No carotid bruit or JVD.  Cardiovascular:     Rate and Rhythm: Normal rate and regular rhythm.     Pulses:          Carotid pulses are 2+ on the right side and 2+ on the left side.      Radial pulses are 2+ on the right side and 2+ on the left side.       Femoral pulses are 2+ on the right side and 2+ on the left side.      Popliteal pulses are 1+ on the left side. Right popliteal pulse not accessible.       Dorsalis pedis pulses are 0 on the left side.       Posterior tibial pulses are 0 on the left side. Right posterior tibial pulse not accessible.     Heart sounds: S1 normal and S2 normal. No murmur heard.   No gallop.  Pulmonary:     Effort: Pulmonary effort is normal. No respiratory distress.     Breath sounds: No wheezing, rhonchi or rales.  Chest:     Comments: Pacemaker  in the left infraclavicular region site is clean dry and intact. Dialysis catheter present anterior chest wall.  Abdominal:     General: Abdomen is flat.     Palpations: Abdomen is soft.  Musculoskeletal:        General: Deformity (Right BKA) present.     Cervical back: Normal range of motion and neck supple.     Left lower leg: No edema.     Comments: Right BKA  Skin:    General: Skin is warm and dry.     Capillary Refill: Capillary refill takes less than 2 seconds.   LABORATORY DATA: CBC Latest Ref Rng & Units 05/10/2021 05/01/2021 03/27/2021  WBC 4.0 - 10.5 K/uL - - -  Hemoglobin 13.0 - 17.0 g/dL 10.5(L) 9.5(L) 9.9(L)  Hematocrit 39.0 - 52.0 % 31.0(L) 28.0(L) 29.0(L)  Platelets 150 - 400 K/uL - - -    CMP Latest Ref Rng & Units 05/10/2021 05/01/2021 03/27/2021  Glucose 70 - 99 mg/dL 237(H) 310(H) 225(H)  BUN 8 - 23 mg/dL 38(H) 55(H) 54(H)  Creatinine 0.61 - 1.24 mg/dL 5.40(H) 5.60(H) 6.50(H)  Sodium 135 - 145 mmol/L 137 135 134(L)  Potassium 3.5 - 5.1 mmol/L 3.8 4.2 4.2  Chloride 98 - 111 mmol/L 97(L) 95(L) 95(L)  CO2 22 - 32 mmol/L - - -  Calcium 8.9 - 10.3 mg/dL - - -  Total Protein 6.5 - 8.1 g/dL - - -  Total Bilirubin 0.3 - 1.2 mg/dL - - -  Alkaline Phos 38 - 126 U/L - - -  AST 15 - 41 U/L - - -  ALT 0 - 44 U/L - - -    Lab Results  Component Value Date   HGBA1C 5.5 05/23/2020   HGBA1C 8.6 (H) 01/27/2020   HGBA1C 6.5 (H) 09/10/2017   External Labs:   Cholesterol, total 114.000 m 10/17/2020 HDL 39.000 mg 10/17/2020 LDL 32.000 mg 10/17/2020 Triglycerides 214.000 m 10/17/2020  A1C 8.800 01/30/2021  10/06/2019: A1c 6.5%.  Serum glucose 136 mg, BUN 55, creatinine 2.6, eGFR 25/30 ML.  Potassium 5.4.  CMP otherwise normal.  HB 10.2/HCT 31.4, platelets 141.  Normal indicis.  Total cholesterol 95, triglycerides 98, HDL 34, LDL 41.  Non-HDL cholesterol  61.  PSA normal.  11/17/2020: BUN 32, creatinine 4.44, potassium 3.9, sodium 130  12/01/2020: Platelets 151, hemoglobin 9.6,  hematocrit 28.4  Allergies  Allergen Reactions   Cytoxan [Cyclophosphamide]     pancytopenia   Keflex [Cephalexin] Nausea And Vomiting   Pletal [Cilostazol] Palpitations    "Can hear heart beating loudly".   Sulfa Antibiotics Rash    Other reaction(s): Unknown    Current Outpatient Medications on File Prior to Visit  Medication Sig Dispense Refill   ACCU-CHEK FASTCLIX LANCETS MISC Check blood sugar TID & QHS 102 each 2   apixaban (ELIQUIS) 5 MG TABS tablet Take 1 tablet (5 mg total) by mouth 2 (two) times daily. 180 tablet 0   atorvastatin (LIPITOR) 20 MG tablet Take 1 tablet by mouth once daily 90 tablet 0   blood glucose meter kit and supplies KIT Check blood sugar TID & QHS 1 each 0   Blood Glucose Monitoring Suppl (ACCU-CHEK ADVANTAGE DIABETES) kit Use as instructed 1 each 0   furosemide (LASIX) 80 MG tablet Take 80 mg by mouth See admin instructions. Take 1 tablet (80 mg) by mouth on (non-dialysis days) Mondays, Wednesdays, Fridays & Sundays in the morning.     glucose blood (ACCU-CHEK AVIVA PLUS) test strip Use as instructed 100 each 12   glucose blood (CHOICE DM FORA G20 TEST STRIPS) test strip Check blood sugar TID & QHS 100 each 12   glucose blood test strip Use as instructed 100 each 12   insulin NPH-regular Human (70-30) 100 UNIT/ML injection Inject 25 Units into the skin 2 (two) times daily with a meal. 10 mL 11   Multiple Vitamin (MULTIVITAMIN WITH MINERALS) TABS tablet Take 1 tablet by mouth daily.     pantoprazole (PROTONIX) 40 MG tablet Take 40 mg by mouth in the morning.     No current facility-administered medications on file prior to visit.    Medications after today's encounter: Current Outpatient Medications  Medication Instructions   ACCU-CHEK FASTCLIX LANCETS MISC Check blood sugar TID & QHS   apixaban (ELIQUIS) 5 mg, Oral, 2 times daily   atorvastatin (LIPITOR) 20 MG tablet Take 1 tablet by mouth once daily   blood glucose meter kit and supplies KIT Check  blood sugar TID & QHS   Blood Glucose Monitoring Suppl (ACCU-CHEK ADVANTAGE DIABETES) kit Use as instructed   furosemide (LASIX) 80 mg, Oral, See admin instructions, Take 1 tablet (80 mg) by mouth on (non-dialysis days) Mondays, Wednesdays, Fridays & Sundays in the morning.   glucose blood (ACCU-CHEK AVIVA PLUS) test strip Use as instructed   glucose blood (CHOICE DM FORA G20 TEST STRIPS) test strip Check blood sugar TID & QHS   glucose blood test strip Use as instructed   insulin NPH-regular Human (70-30) 100 UNIT/ML injection 25 Units, Subcutaneous, 2 times daily with meals   metoprolol tartrate (LOPRESSOR) 25 mg, Oral, 2 times daily   Multiple Vitamin (MULTIVITAMIN WITH MINERALS) TABS tablet 1 tablet, Oral, Daily   pantoprazole (PROTONIX) 40 mg, Oral, Every morning     CARDIAC DATABASE:  Lexiscan myoview stress test 01/27/2016: 1. The resting electrocardiogram demonstrated normal sinus rhythm, normal resting conduction, no resting arrhythmias and normal rest repolarization. Stress EKG is non-diagnostic for ischemia as it a pharmacologic stress using Lexiscan. Stress symptoms included dyspnea. 2. The perfusion imaging study demonstrates a moderate-sized defect in the inferior wall extending from the base towards the apex, the defect slightly improved in stress images, suggests soft tissue attenuation. However  scar in this region could not excluded. Left ventricular systolic function Calculated by QGS was 72%. This is a low risk study. Clinical correlation is recommended.  Echocardiogram 05/24/2020: LVEF 60-65%, no regional wall motion of normalities, severe left ventricular hypertrophy, grade 2 diastolic impairment, mildly elevated PASP, moderately dilated biatrial cavities, mild MR.   Left Heart Catheterization 08/19/20:  Prior coronary angiogram: 11/09/2014: Diamondback CSI coronary atherectomy and stenting of the mid LAD-3.0 x 28 mm Promus Premier DES following. Distal dominant circumflex  coronary artery following diamondback atherectomy and implantation of a 2.5 x 16 mm Promus Premier DES. Previously placed stents in LAD and CX patent. Mild to moderate diffuse disease in LAD, D1 and D2 and distal dominant Cx. RCA non dominant and severely diseased and small.  Pacemaker in situ: Medtronic advica DR (MRI compatible) dual-chamber pacemaker 10/07/2014  Scheduled  In office pacemaker check 05/24/21:  Single (S)/Dual (D)/BV: Dual. Presenting ASVS. Pacemaker dependant:  No. Underlying A. Fib. AP 14%, VP 3%. AMS Episodes 320.  AT/AF burden 44.6% . Longest 36 hours. Latest 02/13/2021. Today 6 hours and ongoing. HVR 1 = False. Longevity 3 Years. Magnet rate: >85%. Lead measurements: Stable. Histogram: Low (L)/normal (N)/high (H)  Normal. Patient activity Low.   Observations: Normal pacemaker function. Changes: None.  EKG:  12/07/2018: Sinus rhythm at a rate of 70 bpm.  Left axis, left anterior fascicular block.  Complete right bundle branch block.  Compared to EKG 04/22/2020, poor R wave progression not present.  04/22/2020: Normal sinus rhythm, 72 bpm, left axis deviation, left anterior fascicular block, right bundle branch block, poor R wave progression.  No significant change compared to prior ECG dated 10/23/2019.  07/13/2020: Atrial fibrillation, 84 bpm, left axis deviation, right bundle branch block, left anterior fascicular block. Since last ECG sinus rhythm is transition to A. fib.  IMPRESSION:    ICD-10-CM   1. Encounter for care of pacemaker  Z45.018     2. Pacemaker: Medtronic Advisa DR MRI [id number] Dual chamber pacemaker 10/08/2014  Z95.0     3. Sinus node dysfunction (HCC)  I49.5     4. Paroxysmal atrial fibrillation (HCC)  I48.0 metoprolol tartrate (LOPRESSOR) 25 MG tablet    5. Atherosclerosis of native coronary artery of native heart without angina pectoris  I25.10     6. PAD (peripheral artery disease) (HCC)  I73.9     7. Essential hypertension  I10 metoprolol  tartrate (LOPRESSOR) 25 MG tablet      This patients CHA2DS2-VASc Score 3 (HTN, DM, vasc) and yearly risk of stroke 3.2%.   Meds ordered this encounter  Medications   metoprolol tartrate (LOPRESSOR) 25 MG tablet    Sig: Take 1 tablet (25 mg total) by mouth 2 (two) times daily.    Dispense:  180 tablet    Refill:  3     RECOMMENDATIONS:  KRYSTLE OBERMAN is a 60 y.o. Caucasian male patient with hypertension, hyperlipidemia, diabetes mellitus with end-stage renal disease on hemodialysis, coronary artery disease with atherectomy followed by stenting to LAD and to the distal circumflex in 2015. Coronary angiography in 2021, sick sinus syndrome and also h/o complete heart block (Resolved) and paroxysmal atrial fibrillation SP permanent Medtronic pacemaker implantation in 2015, PAD SP right BKA and remote bilateral femoropopliteal bypass surgery, he also has history of alcohol excess use.  Patient presents for 6 month follow up. Patient is presently doing well without specific complaints. Denies chest pain, swelling, palpitations, orthopnea, PND. Denies melena, blood in his urine  or stool.  He is presently doing well, today he is in persistent atrial fibrillation.  His atrial fibrillation burden has increased to almost 50%, his blood pressure is also high, in view of known coronary artery disease and atrial fibrillation, I would prefer [name] to be on a beta-blocker, metoprolol tartrate 25 mg p.o. twice daily started.  He is advised to hold them during the days of dialysis.  I will be happy to discontinue this if he cannot tolerate this or causes hypotension or difficulty with dialysis.  Previously it was discontinued due to bradycardia however he has underlying pacemaker is working normally and his base heart rate has been set at 60 bpm.  Hence previously recorded heart rate of 35 bpm is an error as previously alluded to, normal pacemaker function.  His blood pressure also continues to remain  elevated.  He is tolerating Eliquis without bleeding diathesis apply milligrams twice daily.  His lipids are well controlled except for elevated triglycerides and this is related to his uncontrolled diabetes and excess alcohol use.  He is trying his best to make lifestyle changes.  He is not in any acute decompensated heart failure, he has not had any recurrence of angina pectoris, PAD has remained stable.  I will see [name] back in 6 months for follow-up.   Adrian Prows, MD, [facility] 05/25/2021, 4:58 AM Office: [phone] Fax: [phone] Pager: [phone]   CC: Harrie Jeans, MD (Nephrology); Domenick Gong, MD (PCP)

## 2021-05-25 DIAGNOSIS — E0821 Diabetes mellitus due to underlying condition with diabetic nephropathy: Secondary | ICD-10-CM | POA: Diagnosis not present

## 2021-05-25 DIAGNOSIS — D631 Anemia in chronic kidney disease: Secondary | ICD-10-CM | POA: Diagnosis not present

## 2021-05-25 DIAGNOSIS — N186 End stage renal disease: Secondary | ICD-10-CM | POA: Diagnosis not present

## 2021-05-25 DIAGNOSIS — D689 Coagulation defect, unspecified: Secondary | ICD-10-CM | POA: Diagnosis not present

## 2021-05-25 DIAGNOSIS — T8249XA Other complication of vascular dialysis catheter, initial encounter: Secondary | ICD-10-CM | POA: Diagnosis not present

## 2021-05-25 DIAGNOSIS — Z992 Dependence on renal dialysis: Secondary | ICD-10-CM | POA: Diagnosis not present

## 2021-05-25 DIAGNOSIS — N2581 Secondary hyperparathyroidism of renal origin: Secondary | ICD-10-CM | POA: Diagnosis not present

## 2021-05-25 DIAGNOSIS — D509 Iron deficiency anemia, unspecified: Secondary | ICD-10-CM | POA: Diagnosis not present

## 2021-05-27 DIAGNOSIS — Z992 Dependence on renal dialysis: Secondary | ICD-10-CM | POA: Diagnosis not present

## 2021-05-27 DIAGNOSIS — D509 Iron deficiency anemia, unspecified: Secondary | ICD-10-CM | POA: Diagnosis not present

## 2021-05-27 DIAGNOSIS — N186 End stage renal disease: Secondary | ICD-10-CM | POA: Diagnosis not present

## 2021-05-27 DIAGNOSIS — D689 Coagulation defect, unspecified: Secondary | ICD-10-CM | POA: Diagnosis not present

## 2021-05-27 DIAGNOSIS — E0821 Diabetes mellitus due to underlying condition with diabetic nephropathy: Secondary | ICD-10-CM | POA: Diagnosis not present

## 2021-05-27 DIAGNOSIS — N2581 Secondary hyperparathyroidism of renal origin: Secondary | ICD-10-CM | POA: Diagnosis not present

## 2021-05-27 DIAGNOSIS — T8249XA Other complication of vascular dialysis catheter, initial encounter: Secondary | ICD-10-CM | POA: Diagnosis not present

## 2021-05-27 DIAGNOSIS — D631 Anemia in chronic kidney disease: Secondary | ICD-10-CM | POA: Diagnosis not present

## 2021-05-30 ENCOUNTER — Other Ambulatory Visit: Payer: Self-pay | Admitting: Student

## 2021-05-30 DIAGNOSIS — D689 Coagulation defect, unspecified: Secondary | ICD-10-CM | POA: Diagnosis not present

## 2021-05-30 DIAGNOSIS — D631 Anemia in chronic kidney disease: Secondary | ICD-10-CM | POA: Diagnosis not present

## 2021-05-30 DIAGNOSIS — N186 End stage renal disease: Secondary | ICD-10-CM | POA: Diagnosis not present

## 2021-05-30 DIAGNOSIS — I10 Essential (primary) hypertension: Secondary | ICD-10-CM

## 2021-05-30 DIAGNOSIS — D509 Iron deficiency anemia, unspecified: Secondary | ICD-10-CM | POA: Diagnosis not present

## 2021-05-30 DIAGNOSIS — T8249XA Other complication of vascular dialysis catheter, initial encounter: Secondary | ICD-10-CM | POA: Diagnosis not present

## 2021-05-30 DIAGNOSIS — Z992 Dependence on renal dialysis: Secondary | ICD-10-CM | POA: Diagnosis not present

## 2021-05-30 DIAGNOSIS — N2581 Secondary hyperparathyroidism of renal origin: Secondary | ICD-10-CM | POA: Diagnosis not present

## 2021-05-30 DIAGNOSIS — E0821 Diabetes mellitus due to underlying condition with diabetic nephropathy: Secondary | ICD-10-CM | POA: Diagnosis not present

## 2021-05-30 DIAGNOSIS — I2510 Atherosclerotic heart disease of native coronary artery without angina pectoris: Secondary | ICD-10-CM

## 2021-06-01 DIAGNOSIS — N2581 Secondary hyperparathyroidism of renal origin: Secondary | ICD-10-CM | POA: Diagnosis not present

## 2021-06-01 DIAGNOSIS — N186 End stage renal disease: Secondary | ICD-10-CM | POA: Diagnosis not present

## 2021-06-01 DIAGNOSIS — D631 Anemia in chronic kidney disease: Secondary | ICD-10-CM | POA: Diagnosis not present

## 2021-06-01 DIAGNOSIS — T8249XA Other complication of vascular dialysis catheter, initial encounter: Secondary | ICD-10-CM | POA: Diagnosis not present

## 2021-06-01 DIAGNOSIS — D509 Iron deficiency anemia, unspecified: Secondary | ICD-10-CM | POA: Diagnosis not present

## 2021-06-01 DIAGNOSIS — Z992 Dependence on renal dialysis: Secondary | ICD-10-CM | POA: Diagnosis not present

## 2021-06-01 DIAGNOSIS — E0821 Diabetes mellitus due to underlying condition with diabetic nephropathy: Secondary | ICD-10-CM | POA: Diagnosis not present

## 2021-06-01 DIAGNOSIS — D689 Coagulation defect, unspecified: Secondary | ICD-10-CM | POA: Diagnosis not present

## 2021-06-03 DIAGNOSIS — D689 Coagulation defect, unspecified: Secondary | ICD-10-CM | POA: Diagnosis not present

## 2021-06-03 DIAGNOSIS — Z992 Dependence on renal dialysis: Secondary | ICD-10-CM | POA: Diagnosis not present

## 2021-06-03 DIAGNOSIS — N2581 Secondary hyperparathyroidism of renal origin: Secondary | ICD-10-CM | POA: Diagnosis not present

## 2021-06-03 DIAGNOSIS — T8249XA Other complication of vascular dialysis catheter, initial encounter: Secondary | ICD-10-CM | POA: Diagnosis not present

## 2021-06-03 DIAGNOSIS — D631 Anemia in chronic kidney disease: Secondary | ICD-10-CM | POA: Diagnosis not present

## 2021-06-03 DIAGNOSIS — D509 Iron deficiency anemia, unspecified: Secondary | ICD-10-CM | POA: Diagnosis not present

## 2021-06-03 DIAGNOSIS — E0821 Diabetes mellitus due to underlying condition with diabetic nephropathy: Secondary | ICD-10-CM | POA: Diagnosis not present

## 2021-06-03 DIAGNOSIS — N186 End stage renal disease: Secondary | ICD-10-CM | POA: Diagnosis not present

## 2021-06-06 DIAGNOSIS — N2581 Secondary hyperparathyroidism of renal origin: Secondary | ICD-10-CM | POA: Diagnosis not present

## 2021-06-06 DIAGNOSIS — Z992 Dependence on renal dialysis: Secondary | ICD-10-CM | POA: Diagnosis not present

## 2021-06-06 DIAGNOSIS — E0821 Diabetes mellitus due to underlying condition with diabetic nephropathy: Secondary | ICD-10-CM | POA: Diagnosis not present

## 2021-06-06 DIAGNOSIS — D509 Iron deficiency anemia, unspecified: Secondary | ICD-10-CM | POA: Diagnosis not present

## 2021-06-06 DIAGNOSIS — D689 Coagulation defect, unspecified: Secondary | ICD-10-CM | POA: Diagnosis not present

## 2021-06-06 DIAGNOSIS — D631 Anemia in chronic kidney disease: Secondary | ICD-10-CM | POA: Diagnosis not present

## 2021-06-06 DIAGNOSIS — T8249XA Other complication of vascular dialysis catheter, initial encounter: Secondary | ICD-10-CM | POA: Diagnosis not present

## 2021-06-06 DIAGNOSIS — N186 End stage renal disease: Secondary | ICD-10-CM | POA: Diagnosis not present

## 2021-06-07 ENCOUNTER — Ambulatory Visit: Payer: Medicare Other | Admitting: Student

## 2021-06-08 DIAGNOSIS — N2581 Secondary hyperparathyroidism of renal origin: Secondary | ICD-10-CM | POA: Diagnosis not present

## 2021-06-08 DIAGNOSIS — D509 Iron deficiency anemia, unspecified: Secondary | ICD-10-CM | POA: Diagnosis not present

## 2021-06-08 DIAGNOSIS — N186 End stage renal disease: Secondary | ICD-10-CM | POA: Diagnosis not present

## 2021-06-08 DIAGNOSIS — D631 Anemia in chronic kidney disease: Secondary | ICD-10-CM | POA: Diagnosis not present

## 2021-06-08 DIAGNOSIS — Z992 Dependence on renal dialysis: Secondary | ICD-10-CM | POA: Diagnosis not present

## 2021-06-08 DIAGNOSIS — D689 Coagulation defect, unspecified: Secondary | ICD-10-CM | POA: Diagnosis not present

## 2021-06-08 DIAGNOSIS — T8249XA Other complication of vascular dialysis catheter, initial encounter: Secondary | ICD-10-CM | POA: Diagnosis not present

## 2021-06-08 DIAGNOSIS — E0821 Diabetes mellitus due to underlying condition with diabetic nephropathy: Secondary | ICD-10-CM | POA: Diagnosis not present

## 2021-06-10 DIAGNOSIS — D631 Anemia in chronic kidney disease: Secondary | ICD-10-CM | POA: Diagnosis not present

## 2021-06-10 DIAGNOSIS — N2581 Secondary hyperparathyroidism of renal origin: Secondary | ICD-10-CM | POA: Diagnosis not present

## 2021-06-10 DIAGNOSIS — T8249XA Other complication of vascular dialysis catheter, initial encounter: Secondary | ICD-10-CM | POA: Diagnosis not present

## 2021-06-10 DIAGNOSIS — E0821 Diabetes mellitus due to underlying condition with diabetic nephropathy: Secondary | ICD-10-CM | POA: Diagnosis not present

## 2021-06-10 DIAGNOSIS — Z992 Dependence on renal dialysis: Secondary | ICD-10-CM | POA: Diagnosis not present

## 2021-06-10 DIAGNOSIS — D509 Iron deficiency anemia, unspecified: Secondary | ICD-10-CM | POA: Diagnosis not present

## 2021-06-10 DIAGNOSIS — N186 End stage renal disease: Secondary | ICD-10-CM | POA: Diagnosis not present

## 2021-06-10 DIAGNOSIS — D689 Coagulation defect, unspecified: Secondary | ICD-10-CM | POA: Diagnosis not present

## 2021-06-11 DIAGNOSIS — Z992 Dependence on renal dialysis: Secondary | ICD-10-CM | POA: Diagnosis not present

## 2021-06-11 DIAGNOSIS — N179 Acute kidney failure, unspecified: Secondary | ICD-10-CM | POA: Diagnosis not present

## 2021-06-11 DIAGNOSIS — N186 End stage renal disease: Secondary | ICD-10-CM | POA: Diagnosis not present

## 2021-06-13 DIAGNOSIS — E875 Hyperkalemia: Secondary | ICD-10-CM | POA: Diagnosis not present

## 2021-06-13 DIAGNOSIS — Z992 Dependence on renal dialysis: Secondary | ICD-10-CM | POA: Diagnosis not present

## 2021-06-13 DIAGNOSIS — D689 Coagulation defect, unspecified: Secondary | ICD-10-CM | POA: Diagnosis not present

## 2021-06-13 DIAGNOSIS — D509 Iron deficiency anemia, unspecified: Secondary | ICD-10-CM | POA: Diagnosis not present

## 2021-06-13 DIAGNOSIS — D631 Anemia in chronic kidney disease: Secondary | ICD-10-CM | POA: Diagnosis not present

## 2021-06-13 DIAGNOSIS — E0821 Diabetes mellitus due to underlying condition with diabetic nephropathy: Secondary | ICD-10-CM | POA: Diagnosis not present

## 2021-06-13 DIAGNOSIS — N186 End stage renal disease: Secondary | ICD-10-CM | POA: Diagnosis not present

## 2021-06-13 DIAGNOSIS — T8249XA Other complication of vascular dialysis catheter, initial encounter: Secondary | ICD-10-CM | POA: Diagnosis not present

## 2021-06-13 DIAGNOSIS — N2581 Secondary hyperparathyroidism of renal origin: Secondary | ICD-10-CM | POA: Diagnosis not present

## 2021-06-15 DIAGNOSIS — T8249XA Other complication of vascular dialysis catheter, initial encounter: Secondary | ICD-10-CM | POA: Diagnosis not present

## 2021-06-15 DIAGNOSIS — D509 Iron deficiency anemia, unspecified: Secondary | ICD-10-CM | POA: Diagnosis not present

## 2021-06-15 DIAGNOSIS — D631 Anemia in chronic kidney disease: Secondary | ICD-10-CM | POA: Diagnosis not present

## 2021-06-15 DIAGNOSIS — E875 Hyperkalemia: Secondary | ICD-10-CM | POA: Diagnosis not present

## 2021-06-15 DIAGNOSIS — N186 End stage renal disease: Secondary | ICD-10-CM | POA: Diagnosis not present

## 2021-06-15 DIAGNOSIS — D689 Coagulation defect, unspecified: Secondary | ICD-10-CM | POA: Diagnosis not present

## 2021-06-15 DIAGNOSIS — Z992 Dependence on renal dialysis: Secondary | ICD-10-CM | POA: Diagnosis not present

## 2021-06-15 DIAGNOSIS — N2581 Secondary hyperparathyroidism of renal origin: Secondary | ICD-10-CM | POA: Diagnosis not present

## 2021-06-15 DIAGNOSIS — E0821 Diabetes mellitus due to underlying condition with diabetic nephropathy: Secondary | ICD-10-CM | POA: Diagnosis not present

## 2021-06-17 DIAGNOSIS — T8249XA Other complication of vascular dialysis catheter, initial encounter: Secondary | ICD-10-CM | POA: Diagnosis not present

## 2021-06-17 DIAGNOSIS — N186 End stage renal disease: Secondary | ICD-10-CM | POA: Diagnosis not present

## 2021-06-17 DIAGNOSIS — N2581 Secondary hyperparathyroidism of renal origin: Secondary | ICD-10-CM | POA: Diagnosis not present

## 2021-06-17 DIAGNOSIS — D509 Iron deficiency anemia, unspecified: Secondary | ICD-10-CM | POA: Diagnosis not present

## 2021-06-17 DIAGNOSIS — E0821 Diabetes mellitus due to underlying condition with diabetic nephropathy: Secondary | ICD-10-CM | POA: Diagnosis not present

## 2021-06-17 DIAGNOSIS — Z992 Dependence on renal dialysis: Secondary | ICD-10-CM | POA: Diagnosis not present

## 2021-06-17 DIAGNOSIS — D689 Coagulation defect, unspecified: Secondary | ICD-10-CM | POA: Diagnosis not present

## 2021-06-17 DIAGNOSIS — D631 Anemia in chronic kidney disease: Secondary | ICD-10-CM | POA: Diagnosis not present

## 2021-06-17 DIAGNOSIS — E875 Hyperkalemia: Secondary | ICD-10-CM | POA: Diagnosis not present

## 2021-06-20 DIAGNOSIS — Z992 Dependence on renal dialysis: Secondary | ICD-10-CM | POA: Diagnosis not present

## 2021-06-20 DIAGNOSIS — D631 Anemia in chronic kidney disease: Secondary | ICD-10-CM | POA: Diagnosis not present

## 2021-06-20 DIAGNOSIS — N186 End stage renal disease: Secondary | ICD-10-CM | POA: Diagnosis not present

## 2021-06-20 DIAGNOSIS — D689 Coagulation defect, unspecified: Secondary | ICD-10-CM | POA: Diagnosis not present

## 2021-06-20 DIAGNOSIS — T8249XA Other complication of vascular dialysis catheter, initial encounter: Secondary | ICD-10-CM | POA: Diagnosis not present

## 2021-06-20 DIAGNOSIS — E0821 Diabetes mellitus due to underlying condition with diabetic nephropathy: Secondary | ICD-10-CM | POA: Diagnosis not present

## 2021-06-20 DIAGNOSIS — E875 Hyperkalemia: Secondary | ICD-10-CM | POA: Diagnosis not present

## 2021-06-20 DIAGNOSIS — D509 Iron deficiency anemia, unspecified: Secondary | ICD-10-CM | POA: Diagnosis not present

## 2021-06-20 DIAGNOSIS — N2581 Secondary hyperparathyroidism of renal origin: Secondary | ICD-10-CM | POA: Diagnosis not present

## 2021-06-22 DIAGNOSIS — E875 Hyperkalemia: Secondary | ICD-10-CM | POA: Diagnosis not present

## 2021-06-22 DIAGNOSIS — N2581 Secondary hyperparathyroidism of renal origin: Secondary | ICD-10-CM | POA: Diagnosis not present

## 2021-06-22 DIAGNOSIS — T8249XA Other complication of vascular dialysis catheter, initial encounter: Secondary | ICD-10-CM | POA: Diagnosis not present

## 2021-06-22 DIAGNOSIS — D689 Coagulation defect, unspecified: Secondary | ICD-10-CM | POA: Diagnosis not present

## 2021-06-22 DIAGNOSIS — Z992 Dependence on renal dialysis: Secondary | ICD-10-CM | POA: Diagnosis not present

## 2021-06-22 DIAGNOSIS — E0821 Diabetes mellitus due to underlying condition with diabetic nephropathy: Secondary | ICD-10-CM | POA: Diagnosis not present

## 2021-06-22 DIAGNOSIS — N186 End stage renal disease: Secondary | ICD-10-CM | POA: Diagnosis not present

## 2021-06-22 DIAGNOSIS — D631 Anemia in chronic kidney disease: Secondary | ICD-10-CM | POA: Diagnosis not present

## 2021-06-22 DIAGNOSIS — D509 Iron deficiency anemia, unspecified: Secondary | ICD-10-CM | POA: Diagnosis not present

## 2021-06-24 DIAGNOSIS — D509 Iron deficiency anemia, unspecified: Secondary | ICD-10-CM | POA: Diagnosis not present

## 2021-06-24 DIAGNOSIS — N186 End stage renal disease: Secondary | ICD-10-CM | POA: Diagnosis not present

## 2021-06-24 DIAGNOSIS — D689 Coagulation defect, unspecified: Secondary | ICD-10-CM | POA: Diagnosis not present

## 2021-06-24 DIAGNOSIS — Z992 Dependence on renal dialysis: Secondary | ICD-10-CM | POA: Diagnosis not present

## 2021-06-24 DIAGNOSIS — E0821 Diabetes mellitus due to underlying condition with diabetic nephropathy: Secondary | ICD-10-CM | POA: Diagnosis not present

## 2021-06-24 DIAGNOSIS — N2581 Secondary hyperparathyroidism of renal origin: Secondary | ICD-10-CM | POA: Diagnosis not present

## 2021-06-24 DIAGNOSIS — E875 Hyperkalemia: Secondary | ICD-10-CM | POA: Diagnosis not present

## 2021-06-24 DIAGNOSIS — T8249XA Other complication of vascular dialysis catheter, initial encounter: Secondary | ICD-10-CM | POA: Diagnosis not present

## 2021-06-24 DIAGNOSIS — D631 Anemia in chronic kidney disease: Secondary | ICD-10-CM | POA: Diagnosis not present

## 2021-06-27 DIAGNOSIS — Z992 Dependence on renal dialysis: Secondary | ICD-10-CM | POA: Diagnosis not present

## 2021-06-27 DIAGNOSIS — N186 End stage renal disease: Secondary | ICD-10-CM | POA: Diagnosis not present

## 2021-06-27 DIAGNOSIS — E875 Hyperkalemia: Secondary | ICD-10-CM | POA: Diagnosis not present

## 2021-06-27 DIAGNOSIS — D631 Anemia in chronic kidney disease: Secondary | ICD-10-CM | POA: Diagnosis not present

## 2021-06-27 DIAGNOSIS — T8249XA Other complication of vascular dialysis catheter, initial encounter: Secondary | ICD-10-CM | POA: Diagnosis not present

## 2021-06-27 DIAGNOSIS — D509 Iron deficiency anemia, unspecified: Secondary | ICD-10-CM | POA: Diagnosis not present

## 2021-06-27 DIAGNOSIS — E0821 Diabetes mellitus due to underlying condition with diabetic nephropathy: Secondary | ICD-10-CM | POA: Diagnosis not present

## 2021-06-27 DIAGNOSIS — N2581 Secondary hyperparathyroidism of renal origin: Secondary | ICD-10-CM | POA: Diagnosis not present

## 2021-06-27 DIAGNOSIS — D689 Coagulation defect, unspecified: Secondary | ICD-10-CM | POA: Diagnosis not present

## 2021-06-29 DIAGNOSIS — T8249XA Other complication of vascular dialysis catheter, initial encounter: Secondary | ICD-10-CM | POA: Diagnosis not present

## 2021-06-29 DIAGNOSIS — E875 Hyperkalemia: Secondary | ICD-10-CM | POA: Diagnosis not present

## 2021-06-29 DIAGNOSIS — N2581 Secondary hyperparathyroidism of renal origin: Secondary | ICD-10-CM | POA: Diagnosis not present

## 2021-06-29 DIAGNOSIS — N186 End stage renal disease: Secondary | ICD-10-CM | POA: Diagnosis not present

## 2021-06-29 DIAGNOSIS — D509 Iron deficiency anemia, unspecified: Secondary | ICD-10-CM | POA: Diagnosis not present

## 2021-06-29 DIAGNOSIS — E0821 Diabetes mellitus due to underlying condition with diabetic nephropathy: Secondary | ICD-10-CM | POA: Diagnosis not present

## 2021-06-29 DIAGNOSIS — D631 Anemia in chronic kidney disease: Secondary | ICD-10-CM | POA: Diagnosis not present

## 2021-06-29 DIAGNOSIS — D689 Coagulation defect, unspecified: Secondary | ICD-10-CM | POA: Diagnosis not present

## 2021-06-29 DIAGNOSIS — Z992 Dependence on renal dialysis: Secondary | ICD-10-CM | POA: Diagnosis not present

## 2021-06-30 DIAGNOSIS — Z992 Dependence on renal dialysis: Secondary | ICD-10-CM | POA: Diagnosis not present

## 2021-06-30 DIAGNOSIS — N186 End stage renal disease: Secondary | ICD-10-CM | POA: Diagnosis not present

## 2021-06-30 DIAGNOSIS — T8249XA Other complication of vascular dialysis catheter, initial encounter: Secondary | ICD-10-CM | POA: Diagnosis not present

## 2021-07-01 DIAGNOSIS — N2581 Secondary hyperparathyroidism of renal origin: Secondary | ICD-10-CM | POA: Diagnosis not present

## 2021-07-01 DIAGNOSIS — N186 End stage renal disease: Secondary | ICD-10-CM | POA: Diagnosis not present

## 2021-07-01 DIAGNOSIS — Z992 Dependence on renal dialysis: Secondary | ICD-10-CM | POA: Diagnosis not present

## 2021-07-01 DIAGNOSIS — T8249XA Other complication of vascular dialysis catheter, initial encounter: Secondary | ICD-10-CM | POA: Diagnosis not present

## 2021-07-01 DIAGNOSIS — D509 Iron deficiency anemia, unspecified: Secondary | ICD-10-CM | POA: Diagnosis not present

## 2021-07-01 DIAGNOSIS — D689 Coagulation defect, unspecified: Secondary | ICD-10-CM | POA: Diagnosis not present

## 2021-07-01 DIAGNOSIS — E875 Hyperkalemia: Secondary | ICD-10-CM | POA: Diagnosis not present

## 2021-07-01 DIAGNOSIS — E0821 Diabetes mellitus due to underlying condition with diabetic nephropathy: Secondary | ICD-10-CM | POA: Diagnosis not present

## 2021-07-01 DIAGNOSIS — D631 Anemia in chronic kidney disease: Secondary | ICD-10-CM | POA: Diagnosis not present

## 2021-07-04 DIAGNOSIS — D509 Iron deficiency anemia, unspecified: Secondary | ICD-10-CM | POA: Diagnosis not present

## 2021-07-04 DIAGNOSIS — Z992 Dependence on renal dialysis: Secondary | ICD-10-CM | POA: Diagnosis not present

## 2021-07-04 DIAGNOSIS — E875 Hyperkalemia: Secondary | ICD-10-CM | POA: Diagnosis not present

## 2021-07-04 DIAGNOSIS — N186 End stage renal disease: Secondary | ICD-10-CM | POA: Diagnosis not present

## 2021-07-04 DIAGNOSIS — E0821 Diabetes mellitus due to underlying condition with diabetic nephropathy: Secondary | ICD-10-CM | POA: Diagnosis not present

## 2021-07-04 DIAGNOSIS — N2581 Secondary hyperparathyroidism of renal origin: Secondary | ICD-10-CM | POA: Diagnosis not present

## 2021-07-04 DIAGNOSIS — D631 Anemia in chronic kidney disease: Secondary | ICD-10-CM | POA: Diagnosis not present

## 2021-07-04 DIAGNOSIS — D689 Coagulation defect, unspecified: Secondary | ICD-10-CM | POA: Diagnosis not present

## 2021-07-04 DIAGNOSIS — T8249XA Other complication of vascular dialysis catheter, initial encounter: Secondary | ICD-10-CM | POA: Diagnosis not present

## 2021-07-06 DIAGNOSIS — D509 Iron deficiency anemia, unspecified: Secondary | ICD-10-CM | POA: Diagnosis not present

## 2021-07-06 DIAGNOSIS — D631 Anemia in chronic kidney disease: Secondary | ICD-10-CM | POA: Diagnosis not present

## 2021-07-06 DIAGNOSIS — Z992 Dependence on renal dialysis: Secondary | ICD-10-CM | POA: Diagnosis not present

## 2021-07-06 DIAGNOSIS — N186 End stage renal disease: Secondary | ICD-10-CM | POA: Diagnosis not present

## 2021-07-06 DIAGNOSIS — N2581 Secondary hyperparathyroidism of renal origin: Secondary | ICD-10-CM | POA: Diagnosis not present

## 2021-07-06 DIAGNOSIS — E0821 Diabetes mellitus due to underlying condition with diabetic nephropathy: Secondary | ICD-10-CM | POA: Diagnosis not present

## 2021-07-06 DIAGNOSIS — E875 Hyperkalemia: Secondary | ICD-10-CM | POA: Diagnosis not present

## 2021-07-06 DIAGNOSIS — D689 Coagulation defect, unspecified: Secondary | ICD-10-CM | POA: Diagnosis not present

## 2021-07-06 DIAGNOSIS — T8249XA Other complication of vascular dialysis catheter, initial encounter: Secondary | ICD-10-CM | POA: Diagnosis not present

## 2021-07-08 DIAGNOSIS — D689 Coagulation defect, unspecified: Secondary | ICD-10-CM | POA: Diagnosis not present

## 2021-07-08 DIAGNOSIS — N2581 Secondary hyperparathyroidism of renal origin: Secondary | ICD-10-CM | POA: Diagnosis not present

## 2021-07-08 DIAGNOSIS — T8249XA Other complication of vascular dialysis catheter, initial encounter: Secondary | ICD-10-CM | POA: Diagnosis not present

## 2021-07-08 DIAGNOSIS — D509 Iron deficiency anemia, unspecified: Secondary | ICD-10-CM | POA: Diagnosis not present

## 2021-07-08 DIAGNOSIS — E0821 Diabetes mellitus due to underlying condition with diabetic nephropathy: Secondary | ICD-10-CM | POA: Diagnosis not present

## 2021-07-08 DIAGNOSIS — N186 End stage renal disease: Secondary | ICD-10-CM | POA: Diagnosis not present

## 2021-07-08 DIAGNOSIS — Z992 Dependence on renal dialysis: Secondary | ICD-10-CM | POA: Diagnosis not present

## 2021-07-08 DIAGNOSIS — E875 Hyperkalemia: Secondary | ICD-10-CM | POA: Diagnosis not present

## 2021-07-08 DIAGNOSIS — D631 Anemia in chronic kidney disease: Secondary | ICD-10-CM | POA: Diagnosis not present

## 2021-07-11 DIAGNOSIS — N186 End stage renal disease: Secondary | ICD-10-CM | POA: Diagnosis not present

## 2021-07-11 DIAGNOSIS — E875 Hyperkalemia: Secondary | ICD-10-CM | POA: Diagnosis not present

## 2021-07-11 DIAGNOSIS — Z992 Dependence on renal dialysis: Secondary | ICD-10-CM | POA: Diagnosis not present

## 2021-07-11 DIAGNOSIS — N2581 Secondary hyperparathyroidism of renal origin: Secondary | ICD-10-CM | POA: Diagnosis not present

## 2021-07-11 DIAGNOSIS — D631 Anemia in chronic kidney disease: Secondary | ICD-10-CM | POA: Diagnosis not present

## 2021-07-11 DIAGNOSIS — D509 Iron deficiency anemia, unspecified: Secondary | ICD-10-CM | POA: Diagnosis not present

## 2021-07-11 DIAGNOSIS — D689 Coagulation defect, unspecified: Secondary | ICD-10-CM | POA: Diagnosis not present

## 2021-07-11 DIAGNOSIS — T8249XA Other complication of vascular dialysis catheter, initial encounter: Secondary | ICD-10-CM | POA: Diagnosis not present

## 2021-07-11 DIAGNOSIS — E0821 Diabetes mellitus due to underlying condition with diabetic nephropathy: Secondary | ICD-10-CM | POA: Diagnosis not present

## 2021-07-12 DIAGNOSIS — Z992 Dependence on renal dialysis: Secondary | ICD-10-CM | POA: Diagnosis not present

## 2021-07-12 DIAGNOSIS — N186 End stage renal disease: Secondary | ICD-10-CM | POA: Diagnosis not present

## 2021-07-12 DIAGNOSIS — N179 Acute kidney failure, unspecified: Secondary | ICD-10-CM | POA: Diagnosis not present

## 2021-07-13 DIAGNOSIS — N186 End stage renal disease: Secondary | ICD-10-CM | POA: Diagnosis not present

## 2021-07-13 DIAGNOSIS — D689 Coagulation defect, unspecified: Secondary | ICD-10-CM | POA: Diagnosis not present

## 2021-07-13 DIAGNOSIS — N2581 Secondary hyperparathyroidism of renal origin: Secondary | ICD-10-CM | POA: Diagnosis not present

## 2021-07-13 DIAGNOSIS — D509 Iron deficiency anemia, unspecified: Secondary | ICD-10-CM | POA: Diagnosis not present

## 2021-07-13 DIAGNOSIS — E0821 Diabetes mellitus due to underlying condition with diabetic nephropathy: Secondary | ICD-10-CM | POA: Diagnosis not present

## 2021-07-13 DIAGNOSIS — T8249XA Other complication of vascular dialysis catheter, initial encounter: Secondary | ICD-10-CM | POA: Diagnosis not present

## 2021-07-13 DIAGNOSIS — D631 Anemia in chronic kidney disease: Secondary | ICD-10-CM | POA: Diagnosis not present

## 2021-07-13 DIAGNOSIS — Z992 Dependence on renal dialysis: Secondary | ICD-10-CM | POA: Diagnosis not present

## 2021-07-15 DIAGNOSIS — D631 Anemia in chronic kidney disease: Secondary | ICD-10-CM | POA: Diagnosis not present

## 2021-07-15 DIAGNOSIS — T8249XA Other complication of vascular dialysis catheter, initial encounter: Secondary | ICD-10-CM | POA: Diagnosis not present

## 2021-07-15 DIAGNOSIS — Z992 Dependence on renal dialysis: Secondary | ICD-10-CM | POA: Diagnosis not present

## 2021-07-15 DIAGNOSIS — E0821 Diabetes mellitus due to underlying condition with diabetic nephropathy: Secondary | ICD-10-CM | POA: Diagnosis not present

## 2021-07-15 DIAGNOSIS — D509 Iron deficiency anemia, unspecified: Secondary | ICD-10-CM | POA: Diagnosis not present

## 2021-07-15 DIAGNOSIS — D689 Coagulation defect, unspecified: Secondary | ICD-10-CM | POA: Diagnosis not present

## 2021-07-15 DIAGNOSIS — N2581 Secondary hyperparathyroidism of renal origin: Secondary | ICD-10-CM | POA: Diagnosis not present

## 2021-07-15 DIAGNOSIS — N186 End stage renal disease: Secondary | ICD-10-CM | POA: Diagnosis not present

## 2021-07-18 DIAGNOSIS — E0821 Diabetes mellitus due to underlying condition with diabetic nephropathy: Secondary | ICD-10-CM | POA: Diagnosis not present

## 2021-07-18 DIAGNOSIS — N2581 Secondary hyperparathyroidism of renal origin: Secondary | ICD-10-CM | POA: Diagnosis not present

## 2021-07-18 DIAGNOSIS — D509 Iron deficiency anemia, unspecified: Secondary | ICD-10-CM | POA: Diagnosis not present

## 2021-07-18 DIAGNOSIS — N186 End stage renal disease: Secondary | ICD-10-CM | POA: Diagnosis not present

## 2021-07-18 DIAGNOSIS — Z992 Dependence on renal dialysis: Secondary | ICD-10-CM | POA: Diagnosis not present

## 2021-07-18 DIAGNOSIS — D689 Coagulation defect, unspecified: Secondary | ICD-10-CM | POA: Diagnosis not present

## 2021-07-18 DIAGNOSIS — T8249XA Other complication of vascular dialysis catheter, initial encounter: Secondary | ICD-10-CM | POA: Diagnosis not present

## 2021-07-18 DIAGNOSIS — D631 Anemia in chronic kidney disease: Secondary | ICD-10-CM | POA: Diagnosis not present

## 2021-07-20 DIAGNOSIS — T8249XA Other complication of vascular dialysis catheter, initial encounter: Secondary | ICD-10-CM | POA: Diagnosis not present

## 2021-07-20 DIAGNOSIS — D509 Iron deficiency anemia, unspecified: Secondary | ICD-10-CM | POA: Diagnosis not present

## 2021-07-20 DIAGNOSIS — D631 Anemia in chronic kidney disease: Secondary | ICD-10-CM | POA: Diagnosis not present

## 2021-07-20 DIAGNOSIS — E0821 Diabetes mellitus due to underlying condition with diabetic nephropathy: Secondary | ICD-10-CM | POA: Diagnosis not present

## 2021-07-20 DIAGNOSIS — Z992 Dependence on renal dialysis: Secondary | ICD-10-CM | POA: Diagnosis not present

## 2021-07-20 DIAGNOSIS — D689 Coagulation defect, unspecified: Secondary | ICD-10-CM | POA: Diagnosis not present

## 2021-07-20 DIAGNOSIS — N2581 Secondary hyperparathyroidism of renal origin: Secondary | ICD-10-CM | POA: Diagnosis not present

## 2021-07-20 DIAGNOSIS — N186 End stage renal disease: Secondary | ICD-10-CM | POA: Diagnosis not present

## 2021-07-21 ENCOUNTER — Other Ambulatory Visit: Payer: Self-pay

## 2021-07-21 DIAGNOSIS — N186 End stage renal disease: Secondary | ICD-10-CM

## 2021-07-22 DIAGNOSIS — N2581 Secondary hyperparathyroidism of renal origin: Secondary | ICD-10-CM | POA: Diagnosis not present

## 2021-07-22 DIAGNOSIS — E0821 Diabetes mellitus due to underlying condition with diabetic nephropathy: Secondary | ICD-10-CM | POA: Diagnosis not present

## 2021-07-22 DIAGNOSIS — D631 Anemia in chronic kidney disease: Secondary | ICD-10-CM | POA: Diagnosis not present

## 2021-07-22 DIAGNOSIS — D509 Iron deficiency anemia, unspecified: Secondary | ICD-10-CM | POA: Diagnosis not present

## 2021-07-22 DIAGNOSIS — D689 Coagulation defect, unspecified: Secondary | ICD-10-CM | POA: Diagnosis not present

## 2021-07-22 DIAGNOSIS — Z992 Dependence on renal dialysis: Secondary | ICD-10-CM | POA: Diagnosis not present

## 2021-07-22 DIAGNOSIS — N186 End stage renal disease: Secondary | ICD-10-CM | POA: Diagnosis not present

## 2021-07-22 DIAGNOSIS — T8249XA Other complication of vascular dialysis catheter, initial encounter: Secondary | ICD-10-CM | POA: Diagnosis not present

## 2021-07-25 DIAGNOSIS — N186 End stage renal disease: Secondary | ICD-10-CM | POA: Diagnosis not present

## 2021-07-25 DIAGNOSIS — E0821 Diabetes mellitus due to underlying condition with diabetic nephropathy: Secondary | ICD-10-CM | POA: Diagnosis not present

## 2021-07-25 DIAGNOSIS — D509 Iron deficiency anemia, unspecified: Secondary | ICD-10-CM | POA: Diagnosis not present

## 2021-07-25 DIAGNOSIS — Z992 Dependence on renal dialysis: Secondary | ICD-10-CM | POA: Diagnosis not present

## 2021-07-25 DIAGNOSIS — N2581 Secondary hyperparathyroidism of renal origin: Secondary | ICD-10-CM | POA: Diagnosis not present

## 2021-07-25 DIAGNOSIS — D631 Anemia in chronic kidney disease: Secondary | ICD-10-CM | POA: Diagnosis not present

## 2021-07-25 DIAGNOSIS — T8249XA Other complication of vascular dialysis catheter, initial encounter: Secondary | ICD-10-CM | POA: Diagnosis not present

## 2021-07-25 DIAGNOSIS — D689 Coagulation defect, unspecified: Secondary | ICD-10-CM | POA: Diagnosis not present

## 2021-07-26 ENCOUNTER — Other Ambulatory Visit: Payer: Self-pay

## 2021-07-26 ENCOUNTER — Ambulatory Visit (INDEPENDENT_AMBULATORY_CARE_PROVIDER_SITE_OTHER): Payer: Medicare Other | Admitting: Physician Assistant

## 2021-07-26 ENCOUNTER — Ambulatory Visit (HOSPITAL_COMMUNITY)
Admission: RE | Admit: 2021-07-26 | Discharge: 2021-07-26 | Disposition: A | Discharge status: Home / Self Care | Payer: Medicare Other | Source: Ambulatory Visit | Attending: Vascular Surgery | Admitting: Vascular Surgery

## 2021-07-26 VITALS — BP 157/81 | HR 102 | Temp 98.2°F | Resp 16 | Ht 72.0 in | Wt 260.0 lb

## 2021-07-26 DIAGNOSIS — N186 End stage renal disease: Secondary | ICD-10-CM | POA: Diagnosis not present

## 2021-07-26 NOTE — Progress Notes (Signed)
POST OPERATIVE OFFICE NOTE    CC:  F/u for surgery  HPI:  This is a 60 y.o. male who is s/p first stage basilic  on 04/27/82 by Dr. Donzetta Matters.   S/P failed right cephalic fistula.  He has a working [facility] placed else wear for HD .   Pt returns today for follow up.  Pt states he has no pain, loss of motor or loss of sensation in the right UE.    Allergies  Allergen Reactions   Cytoxan [Cyclophosphamide]     pancytopenia   Keflex [Cephalexin] Nausea And Vomiting   Pletal [Cilostazol] Palpitations    "Can hear heart beating loudly".   Sulfa Antibiotics Rash    Other reaction(s): Unknown    Current Outpatient Medications  Medication Sig Dispense Refill   ACCU-CHEK FASTCLIX LANCETS MISC Check blood sugar TID & QHS 102 each 2   apixaban (ELIQUIS) 5 MG TABS tablet Take 1 tablet (5 mg total) by mouth 2 (two) times daily. 180 tablet 0   atorvastatin (LIPITOR) 20 MG tablet Take 1 tablet by mouth once daily 90 tablet 0   blood glucose meter kit and supplies KIT Check blood sugar TID & QHS 1 each 0   Blood Glucose Monitoring Suppl (ACCU-CHEK ADVANTAGE DIABETES) kit Use as instructed 1 each 0   furosemide (LASIX) 80 MG tablet Take 80 mg by mouth See admin instructions. Take 1 tablet (80 mg) by mouth on (non-dialysis days) Mondays, Wednesdays, Fridays & _0 4        0.70        1.22             +------------+----------+-------------+----------+--------+  Dist UA        298        0.61        1.13             +------------+----------+-------------+----------+--------+  AC Fossa       481        0.30        0.98             +------------+----------+-------------+----------+--------+          Summary:  Patent arteriovenous fistula.   Incision:  well healed Extremities:  palpable radial pulse, thrill in fistula and grip 5/5 equal B  Lungs: Non labored breathing     Assessment/Plan:  This is a 60 y.o. male who is s/p:Right brachial artery to basilic vein AV fistula creation The fistula is maturing well with palpable thrill and no symptoms of steal.  He will be scheduled for second stage Basilic fistula with Dr. Donzetta Matters.    Roxy Horseman PA-C [facility] [phone]   Clinic MD:  Donzetta Matters

## 2021-07-26 NOTE — H&P (View-Only) (Signed)
POST OPERATIVE OFFICE NOTE    CC:  F/u for surgery  HPI:  This is a 60 y.o. male who is s/p first stage basilic  on 04/27/82 by Dr. Donzetta Matters.   S/P failed right cephalic fistula.  He has a working [facility] placed else wear for HD .   Pt returns today for follow up.  Pt states he has no pain, loss of motor or loss of sensation in the right UE.    Allergies  Allergen Reactions   Cytoxan [Cyclophosphamide]     pancytopenia   Keflex [Cephalexin] Nausea And Vomiting   Pletal [Cilostazol] Palpitations    "Can hear heart beating loudly".   Sulfa Antibiotics Rash    Other reaction(s): Unknown    Current Outpatient Medications  Medication Sig Dispense Refill   ACCU-CHEK FASTCLIX LANCETS MISC Check blood sugar TID & QHS 102 each 2   apixaban (ELIQUIS) 5 MG TABS tablet Take 1 tablet (5 mg total) by mouth 2 (two) times daily. 180 tablet 0   atorvastatin (LIPITOR) 20 MG tablet Take 1 tablet by mouth once daily 90 tablet 0   blood glucose meter kit and supplies KIT Check blood sugar TID & QHS 1 each 0   Blood Glucose Monitoring Suppl (ACCU-CHEK ADVANTAGE DIABETES) kit Use as instructed 1 each 0   furosemide (LASIX) 80 MG tablet Take 80 mg by mouth See admin instructions. Take 1 tablet (80 mg) by mouth on (non-dialysis days) Mondays, Wednesdays, Fridays & _0 4        0.70        1.22             +------------+----------+-------------+----------+--------+  Dist UA        298        0.61        1.13             +------------+----------+-------------+----------+--------+  AC Fossa       481        0.30        0.98             +------------+----------+-------------+----------+--------+          Summary:  Patent arteriovenous fistula.   Incision:  well healed Extremities:  palpable radial pulse, thrill in fistula and grip 5/5 equal B  Lungs: Non labored breathing     Assessment/Plan:  This is a 60 y.o. male who is s/p:Right brachial artery to basilic vein AV fistula creation The fistula is maturing well with palpable thrill and no symptoms of steal.  He will be scheduled for second stage Basilic fistula with Dr. Donzetta Matters.    Roxy Horseman PA-C [facility] [phone]   Clinic MD:  Donzetta Matters

## 2021-07-27 DIAGNOSIS — E0821 Diabetes mellitus due to underlying condition with diabetic nephropathy: Secondary | ICD-10-CM | POA: Diagnosis not present

## 2021-07-27 DIAGNOSIS — D631 Anemia in chronic kidney disease: Secondary | ICD-10-CM | POA: Diagnosis not present

## 2021-07-27 DIAGNOSIS — D689 Coagulation defect, unspecified: Secondary | ICD-10-CM | POA: Diagnosis not present

## 2021-07-27 DIAGNOSIS — T8249XA Other complication of vascular dialysis catheter, initial encounter: Secondary | ICD-10-CM | POA: Diagnosis not present

## 2021-07-27 DIAGNOSIS — N2581 Secondary hyperparathyroidism of renal origin: Secondary | ICD-10-CM | POA: Diagnosis not present

## 2021-07-27 DIAGNOSIS — D509 Iron deficiency anemia, unspecified: Secondary | ICD-10-CM | POA: Diagnosis not present

## 2021-07-27 DIAGNOSIS — Z992 Dependence on renal dialysis: Secondary | ICD-10-CM | POA: Diagnosis not present

## 2021-07-27 DIAGNOSIS — N186 End stage renal disease: Secondary | ICD-10-CM | POA: Diagnosis not present

## 2021-07-29 DIAGNOSIS — N2581 Secondary hyperparathyroidism of renal origin: Secondary | ICD-10-CM | POA: Diagnosis not present

## 2021-07-29 DIAGNOSIS — Z992 Dependence on renal dialysis: Secondary | ICD-10-CM | POA: Diagnosis not present

## 2021-07-29 DIAGNOSIS — E0821 Diabetes mellitus due to underlying condition with diabetic nephropathy: Secondary | ICD-10-CM | POA: Diagnosis not present

## 2021-07-29 DIAGNOSIS — N186 End stage renal disease: Secondary | ICD-10-CM | POA: Diagnosis not present

## 2021-07-29 DIAGNOSIS — D631 Anemia in chronic kidney disease: Secondary | ICD-10-CM | POA: Diagnosis not present

## 2021-07-29 DIAGNOSIS — D689 Coagulation defect, unspecified: Secondary | ICD-10-CM | POA: Diagnosis not present

## 2021-07-29 DIAGNOSIS — T8249XA Other complication of vascular dialysis catheter, initial encounter: Secondary | ICD-10-CM | POA: Diagnosis not present

## 2021-07-29 DIAGNOSIS — D509 Iron deficiency anemia, unspecified: Secondary | ICD-10-CM | POA: Diagnosis not present

## 2021-08-01 DIAGNOSIS — T8249XA Other complication of vascular dialysis catheter, initial encounter: Secondary | ICD-10-CM | POA: Diagnosis not present

## 2021-08-01 DIAGNOSIS — Z992 Dependence on renal dialysis: Secondary | ICD-10-CM | POA: Diagnosis not present

## 2021-08-01 DIAGNOSIS — D509 Iron deficiency anemia, unspecified: Secondary | ICD-10-CM | POA: Diagnosis not present

## 2021-08-01 DIAGNOSIS — N186 End stage renal disease: Secondary | ICD-10-CM | POA: Diagnosis not present

## 2021-08-01 DIAGNOSIS — E0821 Diabetes mellitus due to underlying condition with diabetic nephropathy: Secondary | ICD-10-CM | POA: Diagnosis not present

## 2021-08-01 DIAGNOSIS — D689 Coagulation defect, unspecified: Secondary | ICD-10-CM | POA: Diagnosis not present

## 2021-08-01 DIAGNOSIS — D631 Anemia in chronic kidney disease: Secondary | ICD-10-CM | POA: Diagnosis not present

## 2021-08-01 DIAGNOSIS — N2581 Secondary hyperparathyroidism of renal origin: Secondary | ICD-10-CM | POA: Diagnosis not present

## 2021-08-03 DIAGNOSIS — Z992 Dependence on renal dialysis: Secondary | ICD-10-CM | POA: Diagnosis not present

## 2021-08-03 DIAGNOSIS — D509 Iron deficiency anemia, unspecified: Secondary | ICD-10-CM | POA: Diagnosis not present

## 2021-08-03 DIAGNOSIS — N186 End stage renal disease: Secondary | ICD-10-CM | POA: Diagnosis not present

## 2021-08-03 DIAGNOSIS — T8249XA Other complication of vascular dialysis catheter, initial encounter: Secondary | ICD-10-CM | POA: Diagnosis not present

## 2021-08-03 DIAGNOSIS — D631 Anemia in chronic kidney disease: Secondary | ICD-10-CM | POA: Diagnosis not present

## 2021-08-03 DIAGNOSIS — N2581 Secondary hyperparathyroidism of renal origin: Secondary | ICD-10-CM | POA: Diagnosis not present

## 2021-08-03 DIAGNOSIS — E0821 Diabetes mellitus due to underlying condition with diabetic nephropathy: Secondary | ICD-10-CM | POA: Diagnosis not present

## 2021-08-03 DIAGNOSIS — D689 Coagulation defect, unspecified: Secondary | ICD-10-CM | POA: Diagnosis not present

## 2021-08-05 DIAGNOSIS — E0821 Diabetes mellitus due to underlying condition with diabetic nephropathy: Secondary | ICD-10-CM | POA: Diagnosis not present

## 2021-08-05 DIAGNOSIS — D689 Coagulation defect, unspecified: Secondary | ICD-10-CM | POA: Diagnosis not present

## 2021-08-05 DIAGNOSIS — D509 Iron deficiency anemia, unspecified: Secondary | ICD-10-CM | POA: Diagnosis not present

## 2021-08-05 DIAGNOSIS — N186 End stage renal disease: Secondary | ICD-10-CM | POA: Diagnosis not present

## 2021-08-05 DIAGNOSIS — N2581 Secondary hyperparathyroidism of renal origin: Secondary | ICD-10-CM | POA: Diagnosis not present

## 2021-08-05 DIAGNOSIS — Z992 Dependence on renal dialysis: Secondary | ICD-10-CM | POA: Diagnosis not present

## 2021-08-05 DIAGNOSIS — D631 Anemia in chronic kidney disease: Secondary | ICD-10-CM | POA: Diagnosis not present

## 2021-08-05 DIAGNOSIS — T8249XA Other complication of vascular dialysis catheter, initial encounter: Secondary | ICD-10-CM | POA: Diagnosis not present

## 2021-08-08 DIAGNOSIS — T8249XA Other complication of vascular dialysis catheter, initial encounter: Secondary | ICD-10-CM | POA: Diagnosis not present

## 2021-08-08 DIAGNOSIS — D509 Iron deficiency anemia, unspecified: Secondary | ICD-10-CM | POA: Diagnosis not present

## 2021-08-08 DIAGNOSIS — Z992 Dependence on renal dialysis: Secondary | ICD-10-CM | POA: Diagnosis not present

## 2021-08-08 DIAGNOSIS — D631 Anemia in chronic kidney disease: Secondary | ICD-10-CM | POA: Diagnosis not present

## 2021-08-08 DIAGNOSIS — D689 Coagulation defect, unspecified: Secondary | ICD-10-CM | POA: Diagnosis not present

## 2021-08-08 DIAGNOSIS — N186 End stage renal disease: Secondary | ICD-10-CM | POA: Diagnosis not present

## 2021-08-08 DIAGNOSIS — E0821 Diabetes mellitus due to underlying condition with diabetic nephropathy: Secondary | ICD-10-CM | POA: Diagnosis not present

## 2021-08-08 DIAGNOSIS — N2581 Secondary hyperparathyroidism of renal origin: Secondary | ICD-10-CM | POA: Diagnosis not present

## 2021-08-09 ENCOUNTER — Other Ambulatory Visit: Payer: Self-pay

## 2021-08-09 ENCOUNTER — Encounter (HOSPITAL_COMMUNITY): Payer: Self-pay | Admitting: Vascular Surgery

## 2021-08-09 NOTE — Progress Notes (Addendum)
PCP - Domenick Gong, MD Cardiologist - Adrian Prows, MD  PPM/ICD - yes Device Orders -  Rep Notified - yes [email or web address]) This Probation officer faxed periop device orders to [facility], for [facility] to fill out and return to the hospital.   Chest x-ray - 05/24/2020 EKG - 12/07/2020 Stress Test - 01/27/2016 ECHO - 05/24/2020 Cardiac Cath - 08/19/2020  Sleep Study - denies CPAP - n/a  Mr. Langham has type II diabetes. Patient reports that he is checking CBG twice a day and in some mornings it is low (60 - 70). Patient didn't check his CBG today, yet. He adjusts his insulin. I instructed Mr. Joline Salt to not take any 70/30 Insulin on the am of surgery, the night before take 70 % of the dose. I instructed patient to check CBG after awaking and every 2 hours until arrival  to the hospital.  I Instructed patient if CBG is less than 70 to drink  1/2 cup of a clear juice. Recheck CBG in 15 minutes if CBG is not over 70 call, pre- op desk at [phone] for further instructions.   Blood Thinner Instructions: Eliquis - last dose on 08/07/2021 per patient   Aspirin Instructions: patient was instructed to stop taking any aspirin containing products and vitamins until after surgery.   ERAS Protcol - no  COVID TEST- no - ambulatory surgery.  Anesthesia review: yes - cardiac history; pacemaker  Patient was instructed to come for surgery on Friday, 08/11/2021 @ 05:30 o'clock. The night before surgery, after midnight patient will not eat or drink anything. Patient was instructed to take a shower Thursday night and Friday morning using Dial soap.   All instructions explained to the patient, with a verbal understanding of the material. The opportunity to ask questions was provided.

## 2021-08-10 DIAGNOSIS — D509 Iron deficiency anemia, unspecified: Secondary | ICD-10-CM | POA: Diagnosis not present

## 2021-08-10 DIAGNOSIS — N186 End stage renal disease: Secondary | ICD-10-CM | POA: Diagnosis not present

## 2021-08-10 DIAGNOSIS — D631 Anemia in chronic kidney disease: Secondary | ICD-10-CM | POA: Diagnosis not present

## 2021-08-10 DIAGNOSIS — T8249XA Other complication of vascular dialysis catheter, initial encounter: Secondary | ICD-10-CM | POA: Diagnosis not present

## 2021-08-10 DIAGNOSIS — N2581 Secondary hyperparathyroidism of renal origin: Secondary | ICD-10-CM | POA: Diagnosis not present

## 2021-08-10 DIAGNOSIS — Z992 Dependence on renal dialysis: Secondary | ICD-10-CM | POA: Diagnosis not present

## 2021-08-10 DIAGNOSIS — E0821 Diabetes mellitus due to underlying condition with diabetic nephropathy: Secondary | ICD-10-CM | POA: Diagnosis not present

## 2021-08-10 DIAGNOSIS — D689 Coagulation defect, unspecified: Secondary | ICD-10-CM | POA: Diagnosis not present

## 2021-08-11 ENCOUNTER — Encounter (HOSPITAL_COMMUNITY): Payer: Self-pay | Admitting: Vascular Surgery

## 2021-08-11 ENCOUNTER — Ambulatory Visit (HOSPITAL_COMMUNITY): Payer: Medicare Other | Admitting: Physician Assistant

## 2021-08-11 ENCOUNTER — Encounter (HOSPITAL_COMMUNITY)
Admission: RE | Disposition: A | Discharge status: Home / Self Care | Payer: Self-pay | Source: Ambulatory Visit | Attending: Vascular Surgery

## 2021-08-11 ENCOUNTER — Other Ambulatory Visit: Payer: Self-pay

## 2021-08-11 ENCOUNTER — Ambulatory Visit (HOSPITAL_COMMUNITY)
Admission: RE | Admit: 2021-08-11 | Discharge: 2021-08-11 | Disposition: A | Discharge status: Home / Self Care | Payer: Medicare Other | Source: Ambulatory Visit | Attending: Vascular Surgery | Admitting: Vascular Surgery

## 2021-08-11 DIAGNOSIS — N186 End stage renal disease: Secondary | ICD-10-CM | POA: Diagnosis not present

## 2021-08-11 DIAGNOSIS — Z87891 Personal history of nicotine dependence: Secondary | ICD-10-CM | POA: Diagnosis not present

## 2021-08-11 DIAGNOSIS — Z881 Allergy status to other antibiotic agents status: Secondary | ICD-10-CM | POA: Insufficient documentation

## 2021-08-11 DIAGNOSIS — I120 Hypertensive chronic kidney disease with stage 5 chronic kidney disease or end stage renal disease: Secondary | ICD-10-CM | POA: Insufficient documentation

## 2021-08-11 DIAGNOSIS — Z882 Allergy status to sulfonamides status: Secondary | ICD-10-CM | POA: Diagnosis not present

## 2021-08-11 DIAGNOSIS — N179 Acute kidney failure, unspecified: Secondary | ICD-10-CM | POA: Diagnosis not present

## 2021-08-11 DIAGNOSIS — N185 Chronic kidney disease, stage 5: Secondary | ICD-10-CM | POA: Diagnosis not present

## 2021-08-11 DIAGNOSIS — Z7901 Long term (current) use of anticoagulants: Secondary | ICD-10-CM | POA: Diagnosis not present

## 2021-08-11 DIAGNOSIS — Z888 Allergy status to other drugs, medicaments and biological substances status: Secondary | ICD-10-CM | POA: Diagnosis not present

## 2021-08-11 DIAGNOSIS — Z992 Dependence on renal dialysis: Secondary | ICD-10-CM | POA: Diagnosis not present

## 2021-08-11 DIAGNOSIS — Z79899 Other long term (current) drug therapy: Secondary | ICD-10-CM | POA: Insufficient documentation

## 2021-08-11 DIAGNOSIS — E1122 Type 2 diabetes mellitus with diabetic chronic kidney disease: Secondary | ICD-10-CM | POA: Diagnosis not present

## 2021-08-11 DIAGNOSIS — N289 Disorder of kidney and ureter, unspecified: Secondary | ICD-10-CM

## 2021-08-11 HISTORY — PX: BASCILIC VEIN TRANSPOSITION: SHX5742

## 2021-08-11 LAB — GLUCOSE, CAPILLARY
Glucose-Capillary: 139 mg/dL — ABNORMAL HIGH (ref 70–99)
Glucose-Capillary: 129 mg/dL — ABNORMAL HIGH (ref 70–99)

## 2021-08-11 LAB — POCT I-STAT, CHEM 8
BUN: 19 mg/dL (ref 8–23)
Calcium, Ion: 1.06 mmol/L — ABNORMAL LOW (ref 1.15–1.40)
Chloride: 93 mmol/L — ABNORMAL LOW (ref 98–111)
Creatinine, Ser: 3.9 mg/dL — ABNORMAL HIGH (ref 0.61–1.24)
Glucose, Bld: 132 mg/dL — ABNORMAL HIGH (ref 70–99)
HCT: 29 % — ABNORMAL LOW (ref 39.0–52.0)
Hemoglobin: 9.9 g/dL — ABNORMAL LOW (ref 13.0–17.0)
Potassium: 3.5 mmol/L (ref 3.5–5.1)
Sodium: 137 mmol/L (ref 135–145)
TCO2: 30 mmol/L (ref 22–32)

## 2021-08-11 SURGERY — TRANSPOSITION, VEIN, BASILIC
Anesthesia: General | Laterality: Right

## 2021-08-11 MED ORDER — HEPARIN 6000 UNIT IRRIGATION SOLUTION
Status: AC
  Filled 2021-08-11: qty 500

## 2021-08-11 MED ORDER — FENTANYL CITRATE (PF) 250 MCG/5ML IJ SOLN
INTRAMUSCULAR | Status: AC
  Filled 2021-08-11: qty 5

## 2021-08-11 MED ORDER — METOPROLOL SUCCINATE ER 25 MG PO TB24
ORAL_TABLET | ORAL | Status: AC
  Filled 2021-08-11: qty 1

## 2021-08-11 MED ORDER — LIDOCAINE HCL (PF) 1 % IJ SOLN
INTRAMUSCULAR | Status: AC
  Filled 2021-08-11: qty 30

## 2021-08-11 MED ORDER — PHENYLEPHRINE HCL-NACL 20-0.9 MG/250ML-% IV SOLN
INTRAVENOUS | Status: DC | PRN
  Administered 2021-08-11: 40 ug/min via INTRAVENOUS

## 2021-08-11 MED ORDER — MIDAZOLAM HCL 2 MG/2ML IJ SOLN
INTRAMUSCULAR | Status: AC
  Filled 2021-08-11: qty 2

## 2021-08-11 MED ORDER — HEPARIN 6000 UNIT IRRIGATION SOLUTION
Status: DC | PRN
  Administered 2021-08-11: 1

## 2021-08-11 MED ORDER — METOPROLOL TARTRATE 50 MG PO TABS
ORAL_TABLET | ORAL | Status: AC
  Filled 2021-08-11: qty 1

## 2021-08-11 MED ORDER — PROPOFOL 10 MG/ML IV BOLUS
INTRAVENOUS | Status: AC
  Filled 2021-08-11: qty 20

## 2021-08-11 MED ORDER — LIDOCAINE 2% (20 MG/ML) 5 ML SYRINGE
INTRAMUSCULAR | Status: DC | PRN
  Administered 2021-08-11: 100 mg via INTRAVENOUS

## 2021-08-11 MED ORDER — CHLORHEXIDINE GLUCONATE 4 % EX LIQD: 60.0000 mL | Freq: Once | CUTANEOUS | Status: DC

## 2021-08-11 MED ORDER — CHLORHEXIDINE GLUCONATE 0.12 % MT SOLN
15.0000 mL | Freq: Once | OROMUCOSAL | Status: AC
  Administered 2021-08-11: 15 mL via OROMUCOSAL
  Filled 2021-08-11: qty 15

## 2021-08-11 MED ORDER — ORAL CARE MOUTH RINSE: 15.0000 mL | Freq: Once | OROMUCOSAL | Status: AC

## 2021-08-11 MED ORDER — SODIUM CHLORIDE 0.9 % IV SOLN: INTRAVENOUS | Status: DC

## 2021-08-11 MED ORDER — LIDOCAINE-EPINEPHRINE (PF) 1 %-1:200000 IJ SOLN
INTRAMUSCULAR | Status: AC
  Filled 2021-08-11: qty 30

## 2021-08-11 MED ORDER — METOPROLOL TARTRATE 12.5 MG HALF TABLET
25.0000 mg | ORAL_TABLET | ORAL | Status: AC
  Administered 2021-08-11: 25 mg via ORAL

## 2021-08-11 MED ORDER — PROPOFOL 10 MG/ML IV BOLUS
INTRAVENOUS | Status: DC | PRN
  Administered 2021-08-11: 120 mg via INTRAVENOUS

## 2021-08-11 MED ORDER — MIDAZOLAM HCL 5 MG/5ML IJ SOLN
INTRAMUSCULAR | Status: DC | PRN
  Administered 2021-08-11: 2 mg via INTRAVENOUS

## 2021-08-11 MED ORDER — VANCOMYCIN HCL 1500 MG/300ML IV SOLN
1500.0000 mg | INTRAVENOUS | Status: AC
  Administered 2021-08-11: 1500 mg via INTRAVENOUS
  Filled 2021-08-11 (×2): qty 300

## 2021-08-11 MED ORDER — 0.9 % SODIUM CHLORIDE (POUR BTL) OPTIME
TOPICAL | Status: DC | PRN
  Administered 2021-08-11: 1000 mL

## 2021-08-11 MED ORDER — FENTANYL CITRATE (PF) 250 MCG/5ML IJ SOLN
INTRAMUSCULAR | Status: DC | PRN
  Administered 2021-08-11: 50 ug via INTRAVENOUS
  Administered 2021-08-11: 25 ug via INTRAVENOUS

## 2021-08-11 MED ORDER — OXYCODONE-ACETAMINOPHEN 5-325 MG PO TABS: 1.0000 | ORAL_TABLET | Freq: Four times a day (QID) | ORAL | 0 refills | Status: DC | PRN

## 2021-08-11 MED ORDER — ONDANSETRON HCL 4 MG/2ML IJ SOLN
INTRAMUSCULAR | Status: DC | PRN
  Administered 2021-08-11: 4 mg via INTRAVENOUS

## 2021-08-11 SURGICAL SUPPLY — 30 items
ADH SKN CLS APL DERMABOND .7 (GAUZE/BANDAGES/DRESSINGS) ×1
ARMBAND PINK RESTRICT EXTREMIT (MISCELLANEOUS) ×2 IMPLANT
BAG COUNTER SPONGE SURGICOUNT (BAG) ×2 IMPLANT
BAG SPNG CNTER NS LX DISP (BAG) ×1
CANISTER SUCT 3000ML PPV (MISCELLANEOUS) ×2 IMPLANT
CLIP LIGATING EXTRA MED SLVR (CLIP) ×2 IMPLANT
CLIP LIGATING EXTRA SM BLUE (MISCELLANEOUS) ×2 IMPLANT
COVER PROBE W GEL 5X96 (DRAPES) ×4 IMPLANT
DERMABOND ADVANCED (GAUZE/BANDAGES/DRESSINGS) ×1
DERMABOND ADVANCED .7 DNX12 (GAUZE/BANDAGES/DRESSINGS) ×1 IMPLANT
ELECT REM PT RETURN 9FT ADLT (ELECTROSURGICAL) ×2
ELECTRODE REM PT RTRN 9FT ADLT (ELECTROSURGICAL) ×1 IMPLANT
GLOVE SURG ENC MOIS LTX SZ7.5 (GLOVE) ×2 IMPLANT
GOWN STRL REUS W/ TWL LRG LVL3 (GOWN DISPOSABLE) ×2 IMPLANT
GOWN STRL REUS W/ TWL XL LVL3 (GOWN DISPOSABLE) ×1 IMPLANT
GOWN STRL REUS W/TWL LRG LVL3 (GOWN DISPOSABLE) ×4
GOWN STRL REUS W/TWL XL LVL3 (GOWN DISPOSABLE) ×2
KIT BASIN OR (CUSTOM PROCEDURE TRAY) ×2 IMPLANT
KIT TURNOVER KIT B (KITS) ×2 IMPLANT
NS IRRIG 1000ML POUR BTL (IV SOLUTION) ×2 IMPLANT
PACK CV ACCESS (CUSTOM PROCEDURE TRAY) ×2 IMPLANT
PAD ARMBOARD 7.5X6 YLW CONV (MISCELLANEOUS) ×4 IMPLANT
SUT MNCRL AB 4-0 PS2 18 (SUTURE) ×6 IMPLANT
SUT PROLENE 6 0 BV (SUTURE) ×2 IMPLANT
SUT SILK 2 0 SH (SUTURE) ×2 IMPLANT
SUT VIC AB 3-0 SH 27 (SUTURE) ×6
SUT VIC AB 3-0 SH 27X BRD (SUTURE) ×3 IMPLANT
TOWEL GREEN STERILE (TOWEL DISPOSABLE) ×2 IMPLANT
UNDERPAD 30X36 HEAVY ABSORB (UNDERPADS AND DIAPERS) ×2 IMPLANT
WATER STERILE IRR 1000ML POUR (IV SOLUTION) ×2 IMPLANT

## 2021-08-11 NOTE — Anesthesia Preprocedure Evaluation (Signed)
Anesthesia Evaluation  Patient identified by MRN, date of birth, ID band Patient awake    Reviewed: Allergy & Precautions, NPO status , Patient's Chart, lab work & pertinent test results  History of Anesthesia Complications Negative for: history of anesthetic complications  Airway Mallampati: II  TM Distance: >3 FB Neck ROM: Full    Dental  (+) Dental Advisory Given, Poor Dentition   Pulmonary neg pulmonary ROS, former smoker,    Pulmonary exam normal        Cardiovascular hypertension, Pt. on medications and Pt. on home beta blockers + CAD and + Peripheral Vascular Disease  + dysrhythmias Atrial Fibrillation + pacemaker  Rhythm:Regular Rate:Normal     Neuro/Psych PSYCHIATRIC DISORDERS negative neurological ROS     GI/Hepatic Neg liver ROS, GERD  Medicated,  Endo/Other  diabetes, Type 2, Insulin Dependent  Renal/GU ESRFRenal disease  negative genitourinary   Musculoskeletal  (+) Arthritis ,   Abdominal (+)  Abdomen: soft. Bowel sounds: normal.  Peds  Hematology  (+) Blood dyscrasia, anemia ,   Anesthesia Other Findings   Reproductive/Obstetrics                             Anesthesia Physical  Anesthesia Plan  ASA: 3  Anesthesia Plan: General   Post-op Pain Management:    Induction: Intravenous  PONV Risk Score and Plan: 2 and Ondansetron and Midazolam  Airway Management Planned: LMA  Additional Equipment: None  Intra-op Plan:   Post-operative Plan:   Informed Consent: I have reviewed the patients History and Physical, chart, labs and discussed the procedure including the risks, benefits and alternatives for the proposed anesthesia with the patient or authorized representative who has indicated his/her understanding and acceptance.     Dental advisory given  Plan Discussed with: CRNA and Anesthesiologist  Anesthesia Plan Comments:         Anesthesia Quick  Evaluation

## 2021-08-11 NOTE — Anesthesia Postprocedure Evaluation (Signed)
Anesthesia Post Note  Patient: Alexander [name]  Procedure(s) Performed: RIGHT ARM SECOND STAGE BASILIC VEIN TRANSPOSITION (Right)     Patient location during evaluation: PACU Anesthesia Type: General Level of consciousness: sedated Pain management: pain level controlled Vital Signs Assessment: post-procedure vital signs reviewed and stable Respiratory status: spontaneous breathing and respiratory function stable Cardiovascular status: stable Postop Assessment: no apparent nausea or vomiting Anesthetic complications: no   No notable events documented.  Last Vitals:  Vitals:   08/11/21 1015 08/11/21 1030  BP: 118/64 122/78  Pulse: 98 92  Resp: 17 20  Temp:    SpO2: 96% 98%    Last Pain:  Vitals:   08/11/21 1030  TempSrc:   PainSc: 0-No pain                 Valorie Mcgrory DANIEL

## 2021-08-11 NOTE — Interval H&P Note (Signed)
History and Physical Interval Note:  08/11/2021 7:20 AM  Alexander [name]  has presented today for surgery, with the diagnosis of ESRD.  The various methods of treatment have been discussed with the patient and family. After consideration of risks, benefits and other options for treatment, the patient has consented to  Procedure(s): RIGHT ARM SECOND STAGE BASILIC VEIN TRANSPOSITION (Right) as a surgical intervention.  The patient's history has been reviewed, patient examined, no change in status, stable for surgery.  I have reviewed the patient's chart and labs.  Questions were answered to the patient's satisfaction.     [name]

## 2021-08-11 NOTE — Op Note (Signed)
    Patient name: Alexander [name] MRN: [id number] DOB: 01/23/1961 Sex: male  08/11/2021 Pre-operative Diagnosis: End-stage renal disease Post-operative diagnosis:  Same Surgeon:  Erlene Quan C. Donzetta Matters, MD Assistant: Leontine Locket, PA Procedure Performed: Revision right arm basilic vein fistula with transposition   Indications: 60 year old male history of end-stage renal disease currently dialyzing via catheter.  He has had a first stage basilic vein fistula placed in his right upper extremity which has matured nicely and is indicated for revision with transposition.  An assistant was necessary to facilitate exposure and expedite the case.  Findings: Fistula throughout its course measured at least 8 mm.  This was tunneled on the anterior aspect of his arm at completion there was a palpable thrill in the fistula and a palpable radial artery pulse at the wrist both confirmed with Doppler.   Procedure:  The patient was identified in the holding area and taken to the operating recently supine operative and LMA anesthesia was induced.  He was to the prepped draped in the right upper extremity usual fashion.  Antibiotics were ministered a timeout was called.  Ultrasound was used to identify the fistula was marked on the skin.  We opened up the previous incision above the antecubitum.  We dissected down to the vein.  We protected the nerve.  2 separate skip incisions were made to the axilla.  The entire to the vein was dissected free and branches were divided between clips and ties.  The vein was marked for orientation.  We clamped it near the antecubitum near the anastomosis and then clamped in the axilla.  We transected a centimeter past the clamp.  We flushed with heparinized saline there was no leaking it did flushed easily.  It was tunneled laterally.  Both ends were spatulated sewn end-to-end with 6-0 Prolene suture.  Prior completion the usual flushing maneuvers were allowed.  Upon completion there was a very  strong thrill in the fistula.  There is a palpable radial artery pulse the wrist both of these were confirmed with Doppler.  We irrigated the wounds we obtain hemostasis we closed in layers with Vicryl and Monocryl.  Dermabond was placed at the skin level.  He was awakened from anesthesia having tolerated procedure without any complication.  All counts were correct at completion.  EBL: 50 cc     Doye Montilla C. Donzetta Matters, MD [facility] [place] Office: [phone] Pager: [phone]

## 2021-08-11 NOTE — Discharge Instructions (Signed)
   [facility]  Discharge Instructions  AV Fistula or Graft Surgery for Dialysis Access  Please refer to the following instructions for your post-procedure care. Your surgeon or physician assistant will discuss any changes with you.  Activity  You may drive the day following your surgery, if you are comfortable and no longer taking prescription pain medication. Resume full activity as the soreness in your incision resolves.  Bathing/Showering  You may shower after you go home. Keep your incision dry for 48 hours. Do not soak in a bathtub, hot tub, or swim until the incision heals completely. You may not shower if you have a hemodialysis catheter.  Incision Care  Clean your incision with mild soap and water after 48 hours. Pat the area dry with a clean towel. You do not need a bandage unless otherwise instructed. Do not apply any ointments or creams to your incision. You may have skin glue on your incision. Do not peel it off. It will come off on its own in about one week. Your arm may swell a bit after surgery. To reduce swelling use pillows to elevate your arm so it is above your heart. Your doctor will tell you if you need to lightly wrap your arm with an ACE bandage.  Diet  Resume your normal diet. There are not special food restrictions following this procedure. In order to heal from your surgery, it is CRITICAL to get adequate nutrition. Your body requires vitamins, minerals, and protein. Vegetables are the best source of vitamins and minerals. Vegetables also provide the perfect balance of protein. Processed food has little nutritional value, so try to avoid this.  Medications  Resume taking all of your medications. If your incision is causing pain, you may take over-the counter pain relievers such as acetaminophen (Tylenol). If you were prescribed a stronger pain medication, please be aware these medications can cause nausea and constipation. Prevent  nausea by taking the medication with a snack or meal. Avoid constipation by drinking plenty of fluids and eating foods with high amount of fiber, such as fruits, vegetables, and grains.  Do not take Tylenol if you are taking prescription pain medications.  Restart Eliquis on 08/12/2021.  Follow up Your surgeon may want to see you in the office following your access surgery. If so, this will be arranged at the time of your surgery.  Please call us immediately for any of the following conditions:  Increased pain, redness, drainage (pus) from your incision site Fever of 101 degrees or higher Severe or worsening pain at your incision site Hand pain or numbness.  Reduce your risk of vascular disease:  Stop smoking. If you would like help, call [facility] at 1-800-QUIT-NOW (531)492-8506) or [place] at [place] your cholesterol Maintain a desired weight Control your diabetes Keep your blood pressure down  Dialysis  It will take several weeks to several months for your new dialysis access to be ready for use. Your surgeon will determine when it is okay to use it. Your nephrologist will continue to direct your dialysis. You can continue to use your Permcath until your new access is ready for use.   08/11/2021 Alexander [name] [id number] 12-28-1960  Surgeon(s): Waynetta Sandy, MD  Procedure(s): RIGHT ARM SECOND STAGE BASILIC VEIN TRANSPOSITION  x Do not stick fistula for 4 weeks    If you have any questions, please call the office at [phone].

## 2021-08-11 NOTE — Transfer of Care (Signed)
Immediate Anesthesia Transfer of Care Note  Patient: Alexander [name]  Procedure(s) Performed: RIGHT ARM SECOND STAGE BASILIC VEIN TRANSPOSITION (Right)  Patient Location: PACU  Anesthesia Type:General  Level of Consciousness: awake, alert , oriented, patient cooperative and responds to stimulation  Airway & Oxygen Therapy: Patient Spontanous Breathing and Patient connected to face mask oxygen  Post-op Assessment: Report given to RN and Post -op Vital signs reviewed and stable  Post vital signs: Reviewed and stable  Last Vitals:  Vitals Value Taken Time  BP 124/86 08/11/21 0929  Temp 36.9 C 08/11/21 0929  Pulse 85 08/11/21 0929  Resp 13 08/11/21 0929  SpO2 70 % 08/11/21 0929  Vitals shown include unvalidated device data.  Last Pain:  Vitals:   08/11/21 0645  TempSrc:   PainSc: 0-No pain      Patients Stated Pain Goal: 3 (78/67/67 2094)  Complications: No notable events documented.

## 2021-08-12 ENCOUNTER — Encounter (HOSPITAL_COMMUNITY): Payer: Self-pay | Admitting: Vascular Surgery

## 2021-08-12 DIAGNOSIS — E0821 Diabetes mellitus due to underlying condition with diabetic nephropathy: Secondary | ICD-10-CM | POA: Diagnosis not present

## 2021-08-12 DIAGNOSIS — Z992 Dependence on renal dialysis: Secondary | ICD-10-CM | POA: Diagnosis not present

## 2021-08-12 DIAGNOSIS — N186 End stage renal disease: Secondary | ICD-10-CM | POA: Diagnosis not present

## 2021-08-12 DIAGNOSIS — N2581 Secondary hyperparathyroidism of renal origin: Secondary | ICD-10-CM | POA: Diagnosis not present

## 2021-08-12 DIAGNOSIS — D689 Coagulation defect, unspecified: Secondary | ICD-10-CM | POA: Diagnosis not present

## 2021-08-12 DIAGNOSIS — D509 Iron deficiency anemia, unspecified: Secondary | ICD-10-CM | POA: Diagnosis not present

## 2021-08-12 DIAGNOSIS — T8249XA Other complication of vascular dialysis catheter, initial encounter: Secondary | ICD-10-CM | POA: Diagnosis not present

## 2021-08-15 DIAGNOSIS — D631 Anemia in chronic kidney disease: Secondary | ICD-10-CM | POA: Diagnosis not present

## 2021-08-15 DIAGNOSIS — N2581 Secondary hyperparathyroidism of renal origin: Secondary | ICD-10-CM | POA: Diagnosis not present

## 2021-08-15 DIAGNOSIS — Z992 Dependence on renal dialysis: Secondary | ICD-10-CM | POA: Diagnosis not present

## 2021-08-15 DIAGNOSIS — T8249XA Other complication of vascular dialysis catheter, initial encounter: Secondary | ICD-10-CM | POA: Diagnosis not present

## 2021-08-15 DIAGNOSIS — D509 Iron deficiency anemia, unspecified: Secondary | ICD-10-CM | POA: Diagnosis not present

## 2021-08-15 DIAGNOSIS — E0821 Diabetes mellitus due to underlying condition with diabetic nephropathy: Secondary | ICD-10-CM | POA: Diagnosis not present

## 2021-08-15 DIAGNOSIS — D689 Coagulation defect, unspecified: Secondary | ICD-10-CM | POA: Diagnosis not present

## 2021-08-15 DIAGNOSIS — Z23 Encounter for immunization: Secondary | ICD-10-CM | POA: Diagnosis not present

## 2021-08-15 DIAGNOSIS — N186 End stage renal disease: Secondary | ICD-10-CM | POA: Diagnosis not present

## 2021-08-17 DIAGNOSIS — D631 Anemia in chronic kidney disease: Secondary | ICD-10-CM | POA: Diagnosis not present

## 2021-08-17 DIAGNOSIS — Z23 Encounter for immunization: Secondary | ICD-10-CM | POA: Diagnosis not present

## 2021-08-17 DIAGNOSIS — T8249XA Other complication of vascular dialysis catheter, initial encounter: Secondary | ICD-10-CM | POA: Diagnosis not present

## 2021-08-17 DIAGNOSIS — Z992 Dependence on renal dialysis: Secondary | ICD-10-CM | POA: Diagnosis not present

## 2021-08-17 DIAGNOSIS — N186 End stage renal disease: Secondary | ICD-10-CM | POA: Diagnosis not present

## 2021-08-17 DIAGNOSIS — N2581 Secondary hyperparathyroidism of renal origin: Secondary | ICD-10-CM | POA: Diagnosis not present

## 2021-08-17 DIAGNOSIS — D509 Iron deficiency anemia, unspecified: Secondary | ICD-10-CM | POA: Diagnosis not present

## 2021-08-17 DIAGNOSIS — E0821 Diabetes mellitus due to underlying condition with diabetic nephropathy: Secondary | ICD-10-CM | POA: Diagnosis not present

## 2021-08-17 DIAGNOSIS — D689 Coagulation defect, unspecified: Secondary | ICD-10-CM | POA: Diagnosis not present

## 2021-08-19 DIAGNOSIS — D631 Anemia in chronic kidney disease: Secondary | ICD-10-CM | POA: Diagnosis not present

## 2021-08-19 DIAGNOSIS — D689 Coagulation defect, unspecified: Secondary | ICD-10-CM | POA: Diagnosis not present

## 2021-08-19 DIAGNOSIS — N186 End stage renal disease: Secondary | ICD-10-CM | POA: Diagnosis not present

## 2021-08-19 DIAGNOSIS — E0821 Diabetes mellitus due to underlying condition with diabetic nephropathy: Secondary | ICD-10-CM | POA: Diagnosis not present

## 2021-08-19 DIAGNOSIS — T8249XA Other complication of vascular dialysis catheter, initial encounter: Secondary | ICD-10-CM | POA: Diagnosis not present

## 2021-08-19 DIAGNOSIS — D509 Iron deficiency anemia, unspecified: Secondary | ICD-10-CM | POA: Diagnosis not present

## 2021-08-19 DIAGNOSIS — Z992 Dependence on renal dialysis: Secondary | ICD-10-CM | POA: Diagnosis not present

## 2021-08-19 DIAGNOSIS — N2581 Secondary hyperparathyroidism of renal origin: Secondary | ICD-10-CM | POA: Diagnosis not present

## 2021-08-19 DIAGNOSIS — Z23 Encounter for immunization: Secondary | ICD-10-CM | POA: Diagnosis not present

## 2021-08-22 DIAGNOSIS — E0821 Diabetes mellitus due to underlying condition with diabetic nephropathy: Secondary | ICD-10-CM | POA: Diagnosis not present

## 2021-08-22 DIAGNOSIS — N186 End stage renal disease: Secondary | ICD-10-CM | POA: Diagnosis not present

## 2021-08-22 DIAGNOSIS — D631 Anemia in chronic kidney disease: Secondary | ICD-10-CM | POA: Diagnosis not present

## 2021-08-22 DIAGNOSIS — D689 Coagulation defect, unspecified: Secondary | ICD-10-CM | POA: Diagnosis not present

## 2021-08-22 DIAGNOSIS — Z992 Dependence on renal dialysis: Secondary | ICD-10-CM | POA: Diagnosis not present

## 2021-08-22 DIAGNOSIS — N2581 Secondary hyperparathyroidism of renal origin: Secondary | ICD-10-CM | POA: Diagnosis not present

## 2021-08-22 DIAGNOSIS — T8249XA Other complication of vascular dialysis catheter, initial encounter: Secondary | ICD-10-CM | POA: Diagnosis not present

## 2021-08-22 DIAGNOSIS — D509 Iron deficiency anemia, unspecified: Secondary | ICD-10-CM | POA: Diagnosis not present

## 2021-08-24 DIAGNOSIS — D689 Coagulation defect, unspecified: Secondary | ICD-10-CM | POA: Diagnosis not present

## 2021-08-24 DIAGNOSIS — D631 Anemia in chronic kidney disease: Secondary | ICD-10-CM | POA: Diagnosis not present

## 2021-08-24 DIAGNOSIS — N2581 Secondary hyperparathyroidism of renal origin: Secondary | ICD-10-CM | POA: Diagnosis not present

## 2021-08-24 DIAGNOSIS — D509 Iron deficiency anemia, unspecified: Secondary | ICD-10-CM | POA: Diagnosis not present

## 2021-08-24 DIAGNOSIS — T8249XA Other complication of vascular dialysis catheter, initial encounter: Secondary | ICD-10-CM | POA: Diagnosis not present

## 2021-08-24 DIAGNOSIS — Z992 Dependence on renal dialysis: Secondary | ICD-10-CM | POA: Diagnosis not present

## 2021-08-24 DIAGNOSIS — E0821 Diabetes mellitus due to underlying condition with diabetic nephropathy: Secondary | ICD-10-CM | POA: Diagnosis not present

## 2021-08-24 DIAGNOSIS — N186 End stage renal disease: Secondary | ICD-10-CM | POA: Diagnosis not present

## 2021-08-26 DIAGNOSIS — D509 Iron deficiency anemia, unspecified: Secondary | ICD-10-CM | POA: Diagnosis not present

## 2021-08-26 DIAGNOSIS — N2581 Secondary hyperparathyroidism of renal origin: Secondary | ICD-10-CM | POA: Diagnosis not present

## 2021-08-26 DIAGNOSIS — D689 Coagulation defect, unspecified: Secondary | ICD-10-CM | POA: Diagnosis not present

## 2021-08-26 DIAGNOSIS — E0821 Diabetes mellitus due to underlying condition with diabetic nephropathy: Secondary | ICD-10-CM | POA: Diagnosis not present

## 2021-08-26 DIAGNOSIS — D631 Anemia in chronic kidney disease: Secondary | ICD-10-CM | POA: Diagnosis not present

## 2021-08-26 DIAGNOSIS — T8249XA Other complication of vascular dialysis catheter, initial encounter: Secondary | ICD-10-CM | POA: Diagnosis not present

## 2021-08-26 DIAGNOSIS — Z992 Dependence on renal dialysis: Secondary | ICD-10-CM | POA: Diagnosis not present

## 2021-08-26 DIAGNOSIS — N186 End stage renal disease: Secondary | ICD-10-CM | POA: Diagnosis not present

## 2021-08-27 ENCOUNTER — Other Ambulatory Visit: Payer: Self-pay | Admitting: Student

## 2021-08-27 DIAGNOSIS — I10 Essential (primary) hypertension: Secondary | ICD-10-CM

## 2021-08-27 DIAGNOSIS — I2510 Atherosclerotic heart disease of native coronary artery without angina pectoris: Secondary | ICD-10-CM

## 2021-08-28 ENCOUNTER — Other Ambulatory Visit: Payer: Self-pay | Admitting: Student

## 2021-08-28 DIAGNOSIS — I10 Essential (primary) hypertension: Secondary | ICD-10-CM

## 2021-08-28 DIAGNOSIS — I2510 Atherosclerotic heart disease of native coronary artery without angina pectoris: Secondary | ICD-10-CM

## 2021-08-29 DIAGNOSIS — T8249XA Other complication of vascular dialysis catheter, initial encounter: Secondary | ICD-10-CM | POA: Diagnosis not present

## 2021-08-29 DIAGNOSIS — E0821 Diabetes mellitus due to underlying condition with diabetic nephropathy: Secondary | ICD-10-CM | POA: Diagnosis not present

## 2021-08-29 DIAGNOSIS — N2581 Secondary hyperparathyroidism of renal origin: Secondary | ICD-10-CM | POA: Diagnosis not present

## 2021-08-29 DIAGNOSIS — D509 Iron deficiency anemia, unspecified: Secondary | ICD-10-CM | POA: Diagnosis not present

## 2021-08-29 DIAGNOSIS — D631 Anemia in chronic kidney disease: Secondary | ICD-10-CM | POA: Diagnosis not present

## 2021-08-29 DIAGNOSIS — D689 Coagulation defect, unspecified: Secondary | ICD-10-CM | POA: Diagnosis not present

## 2021-08-29 DIAGNOSIS — N186 End stage renal disease: Secondary | ICD-10-CM | POA: Diagnosis not present

## 2021-08-29 DIAGNOSIS — Z992 Dependence on renal dialysis: Secondary | ICD-10-CM | POA: Diagnosis not present

## 2021-08-30 ENCOUNTER — Ambulatory Visit: Payer: Medicare Other | Admitting: Vascular Surgery

## 2021-08-31 ENCOUNTER — Telehealth: Payer: Self-pay

## 2021-08-31 DIAGNOSIS — D631 Anemia in chronic kidney disease: Secondary | ICD-10-CM | POA: Diagnosis not present

## 2021-08-31 DIAGNOSIS — D509 Iron deficiency anemia, unspecified: Secondary | ICD-10-CM | POA: Diagnosis not present

## 2021-08-31 DIAGNOSIS — E0821 Diabetes mellitus due to underlying condition with diabetic nephropathy: Secondary | ICD-10-CM | POA: Diagnosis not present

## 2021-08-31 DIAGNOSIS — N2581 Secondary hyperparathyroidism of renal origin: Secondary | ICD-10-CM | POA: Diagnosis not present

## 2021-08-31 DIAGNOSIS — D689 Coagulation defect, unspecified: Secondary | ICD-10-CM | POA: Diagnosis not present

## 2021-08-31 DIAGNOSIS — T8249XA Other complication of vascular dialysis catheter, initial encounter: Secondary | ICD-10-CM | POA: Diagnosis not present

## 2021-08-31 DIAGNOSIS — N186 End stage renal disease: Secondary | ICD-10-CM | POA: Diagnosis not present

## 2021-08-31 DIAGNOSIS — Z992 Dependence on renal dialysis: Secondary | ICD-10-CM | POA: Diagnosis not present

## 2021-09-02 DIAGNOSIS — Z992 Dependence on renal dialysis: Secondary | ICD-10-CM | POA: Diagnosis not present

## 2021-09-02 DIAGNOSIS — D631 Anemia in chronic kidney disease: Secondary | ICD-10-CM | POA: Diagnosis not present

## 2021-09-02 DIAGNOSIS — N186 End stage renal disease: Secondary | ICD-10-CM | POA: Diagnosis not present

## 2021-09-02 DIAGNOSIS — D509 Iron deficiency anemia, unspecified: Secondary | ICD-10-CM | POA: Diagnosis not present

## 2021-09-02 DIAGNOSIS — E0821 Diabetes mellitus due to underlying condition with diabetic nephropathy: Secondary | ICD-10-CM | POA: Diagnosis not present

## 2021-09-02 DIAGNOSIS — T8249XA Other complication of vascular dialysis catheter, initial encounter: Secondary | ICD-10-CM | POA: Diagnosis not present

## 2021-09-02 DIAGNOSIS — N2581 Secondary hyperparathyroidism of renal origin: Secondary | ICD-10-CM | POA: Diagnosis not present

## 2021-09-02 DIAGNOSIS — D689 Coagulation defect, unspecified: Secondary | ICD-10-CM | POA: Diagnosis not present

## 2021-09-05 DIAGNOSIS — N186 End stage renal disease: Secondary | ICD-10-CM | POA: Diagnosis not present

## 2021-09-05 DIAGNOSIS — Z992 Dependence on renal dialysis: Secondary | ICD-10-CM | POA: Diagnosis not present

## 2021-09-05 DIAGNOSIS — T8249XA Other complication of vascular dialysis catheter, initial encounter: Secondary | ICD-10-CM | POA: Diagnosis not present

## 2021-09-05 DIAGNOSIS — D509 Iron deficiency anemia, unspecified: Secondary | ICD-10-CM | POA: Diagnosis not present

## 2021-09-05 DIAGNOSIS — E0821 Diabetes mellitus due to underlying condition with diabetic nephropathy: Secondary | ICD-10-CM | POA: Diagnosis not present

## 2021-09-05 DIAGNOSIS — N2581 Secondary hyperparathyroidism of renal origin: Secondary | ICD-10-CM | POA: Diagnosis not present

## 2021-09-05 DIAGNOSIS — D689 Coagulation defect, unspecified: Secondary | ICD-10-CM | POA: Diagnosis not present

## 2021-09-06 ENCOUNTER — Encounter: Payer: Self-pay | Admitting: Vascular Surgery

## 2021-09-06 ENCOUNTER — Ambulatory Visit (INDEPENDENT_AMBULATORY_CARE_PROVIDER_SITE_OTHER): Payer: Medicare Other | Admitting: Vascular Surgery

## 2021-09-06 ENCOUNTER — Other Ambulatory Visit: Payer: Self-pay

## 2021-09-06 VITALS — BP 106/67 | HR 97 | Temp 98.5°F | Resp 20 | Ht 72.0 in | Wt 260.0 lb

## 2021-09-06 DIAGNOSIS — N186 End stage renal disease: Secondary | ICD-10-CM

## 2021-09-06 NOTE — Progress Notes (Signed)
     Subjective:     Patient ID: Alexander [name], male   DOB: [birth year], 60 y.o.   MRN: [id number]  HPI 60 year old male with end-stage renal disease currently dialyzing via catheter.  He is status post second stage basilic vein fistula.  He does not have any pain in his right arm at this time and no dysfunction of his hand and no complaints related to today's visit.  All of his wounds have healed well.   Review of Systems No complaints    Objective:   Physical Exam Vitals:   09/06/21 0954  BP: 106/67  Pulse: 97  Resp: 20  Temp: 98.5 F (36.9 C)  SpO2: 95%   Awake alert oriented On lab respirations Right upper arm incisions healing well Strong thrill right arm AV fistula 2+ palpable right radial pulse    Assessment:     60 year old male with end-stage renal disease status post second stage basilic vein fistula.    Plan:     Fistula is okay to begin use.  After 1 week of use should be okay to remove his catheter.   Teana Lindahl C. Donzetta Matters, MD [facility] [place] Office: [phone] Pager: [phone]

## 2021-09-07 DIAGNOSIS — N2581 Secondary hyperparathyroidism of renal origin: Secondary | ICD-10-CM | POA: Diagnosis not present

## 2021-09-07 DIAGNOSIS — T8249XA Other complication of vascular dialysis catheter, initial encounter: Secondary | ICD-10-CM | POA: Diagnosis not present

## 2021-09-07 DIAGNOSIS — D509 Iron deficiency anemia, unspecified: Secondary | ICD-10-CM | POA: Diagnosis not present

## 2021-09-07 DIAGNOSIS — Z992 Dependence on renal dialysis: Secondary | ICD-10-CM | POA: Diagnosis not present

## 2021-09-07 DIAGNOSIS — E0821 Diabetes mellitus due to underlying condition with diabetic nephropathy: Secondary | ICD-10-CM | POA: Diagnosis not present

## 2021-09-07 DIAGNOSIS — D689 Coagulation defect, unspecified: Secondary | ICD-10-CM | POA: Diagnosis not present

## 2021-09-07 DIAGNOSIS — N186 End stage renal disease: Secondary | ICD-10-CM | POA: Diagnosis not present

## 2021-09-09 DIAGNOSIS — D509 Iron deficiency anemia, unspecified: Secondary | ICD-10-CM | POA: Diagnosis not present

## 2021-09-09 DIAGNOSIS — Z992 Dependence on renal dialysis: Secondary | ICD-10-CM | POA: Diagnosis not present

## 2021-09-09 DIAGNOSIS — E0821 Diabetes mellitus due to underlying condition with diabetic nephropathy: Secondary | ICD-10-CM | POA: Diagnosis not present

## 2021-09-09 DIAGNOSIS — D689 Coagulation defect, unspecified: Secondary | ICD-10-CM | POA: Diagnosis not present

## 2021-09-09 DIAGNOSIS — T8249XA Other complication of vascular dialysis catheter, initial encounter: Secondary | ICD-10-CM | POA: Diagnosis not present

## 2021-09-09 DIAGNOSIS — N186 End stage renal disease: Secondary | ICD-10-CM | POA: Diagnosis not present

## 2021-09-09 DIAGNOSIS — N2581 Secondary hyperparathyroidism of renal origin: Secondary | ICD-10-CM | POA: Diagnosis not present

## 2021-09-11 DIAGNOSIS — N186 End stage renal disease: Secondary | ICD-10-CM | POA: Diagnosis not present

## 2021-09-11 DIAGNOSIS — Z992 Dependence on renal dialysis: Secondary | ICD-10-CM | POA: Diagnosis not present

## 2021-09-11 DIAGNOSIS — N179 Acute kidney failure, unspecified: Secondary | ICD-10-CM | POA: Diagnosis not present

## 2021-09-12 DIAGNOSIS — N186 End stage renal disease: Secondary | ICD-10-CM | POA: Diagnosis not present

## 2021-09-12 DIAGNOSIS — N2581 Secondary hyperparathyroidism of renal origin: Secondary | ICD-10-CM | POA: Diagnosis not present

## 2021-09-12 DIAGNOSIS — T8249XA Other complication of vascular dialysis catheter, initial encounter: Secondary | ICD-10-CM | POA: Diagnosis not present

## 2021-09-12 DIAGNOSIS — E0821 Diabetes mellitus due to underlying condition with diabetic nephropathy: Secondary | ICD-10-CM | POA: Diagnosis not present

## 2021-09-12 DIAGNOSIS — Z992 Dependence on renal dialysis: Secondary | ICD-10-CM | POA: Diagnosis not present

## 2021-09-12 DIAGNOSIS — D631 Anemia in chronic kidney disease: Secondary | ICD-10-CM | POA: Diagnosis not present

## 2021-09-12 DIAGNOSIS — D689 Coagulation defect, unspecified: Secondary | ICD-10-CM | POA: Diagnosis not present

## 2021-09-14 ENCOUNTER — Telehealth: Payer: Self-pay

## 2021-09-14 DIAGNOSIS — N2581 Secondary hyperparathyroidism of renal origin: Secondary | ICD-10-CM | POA: Diagnosis not present

## 2021-09-14 DIAGNOSIS — N186 End stage renal disease: Secondary | ICD-10-CM | POA: Diagnosis not present

## 2021-09-14 DIAGNOSIS — D689 Coagulation defect, unspecified: Secondary | ICD-10-CM | POA: Diagnosis not present

## 2021-09-14 DIAGNOSIS — Z992 Dependence on renal dialysis: Secondary | ICD-10-CM | POA: Diagnosis not present

## 2021-09-14 DIAGNOSIS — I739 Peripheral vascular disease, unspecified: Secondary | ICD-10-CM

## 2021-09-14 DIAGNOSIS — T8249XA Other complication of vascular dialysis catheter, initial encounter: Secondary | ICD-10-CM | POA: Diagnosis not present

## 2021-09-14 DIAGNOSIS — E0821 Diabetes mellitus due to underlying condition with diabetic nephropathy: Secondary | ICD-10-CM | POA: Diagnosis not present

## 2021-09-14 DIAGNOSIS — D631 Anemia in chronic kidney disease: Secondary | ICD-10-CM | POA: Diagnosis not present

## 2021-09-14 NOTE — Telephone Encounter (Signed)
Patient's wife calls today to report that patient's leg is cool, but only hurts at the inside of the knee. He cannot put weight on his left leg, but says it is because his knee hurts. His toes have been amputated, but says the midfoot is normal color and does not hurt. Placed him on schedule next week for ABI and graft duplex - also advised to call PCP because inside of the knee pain sounds un-vascular-like.

## 2021-09-16 ENCOUNTER — Encounter (HOSPITAL_COMMUNITY): Payer: Self-pay | Admitting: Emergency Medicine

## 2021-09-16 ENCOUNTER — Emergency Department (HOSPITAL_COMMUNITY)
Admission: EM | Admit: 2021-09-16 | Discharge: 2021-09-16 | Disposition: A | Discharge status: Home / Self Care | Payer: Medicare Other | Attending: Emergency Medicine | Admitting: Emergency Medicine

## 2021-09-16 ENCOUNTER — Emergency Department (HOSPITAL_COMMUNITY): Payer: Medicare Other

## 2021-09-16 ENCOUNTER — Other Ambulatory Visit: Payer: Self-pay

## 2021-09-16 DIAGNOSIS — N3289 Other specified disorders of bladder: Secondary | ICD-10-CM | POA: Diagnosis not present

## 2021-09-16 DIAGNOSIS — R911 Solitary pulmonary nodule: Secondary | ICD-10-CM | POA: Diagnosis not present

## 2021-09-16 DIAGNOSIS — I4891 Unspecified atrial fibrillation: Secondary | ICD-10-CM | POA: Insufficient documentation

## 2021-09-16 DIAGNOSIS — Z7901 Long term (current) use of anticoagulants: Secondary | ICD-10-CM | POA: Diagnosis not present

## 2021-09-16 DIAGNOSIS — M5459 Other low back pain: Secondary | ICD-10-CM | POA: Diagnosis not present

## 2021-09-16 DIAGNOSIS — R52 Pain, unspecified: Secondary | ICD-10-CM

## 2021-09-16 DIAGNOSIS — I120 Hypertensive chronic kidney disease with stage 5 chronic kidney disease or end stage renal disease: Secondary | ICD-10-CM | POA: Diagnosis not present

## 2021-09-16 DIAGNOSIS — E871 Hypo-osmolality and hyponatremia: Secondary | ICD-10-CM

## 2021-09-16 DIAGNOSIS — E1122 Type 2 diabetes mellitus with diabetic chronic kidney disease: Secondary | ICD-10-CM | POA: Insufficient documentation

## 2021-09-16 DIAGNOSIS — J189 Pneumonia, unspecified organism: Secondary | ICD-10-CM

## 2021-09-16 DIAGNOSIS — I2510 Atherosclerotic heart disease of native coronary artery without angina pectoris: Secondary | ICD-10-CM | POA: Diagnosis not present

## 2021-09-16 DIAGNOSIS — I517 Cardiomegaly: Secondary | ICD-10-CM | POA: Diagnosis not present

## 2021-09-16 DIAGNOSIS — Z87891 Personal history of nicotine dependence: Secondary | ICD-10-CM | POA: Diagnosis not present

## 2021-09-16 DIAGNOSIS — I700 Atherosclerosis of aorta: Secondary | ICD-10-CM | POA: Diagnosis not present

## 2021-09-16 DIAGNOSIS — D631 Anemia in chronic kidney disease: Secondary | ICD-10-CM | POA: Diagnosis not present

## 2021-09-16 DIAGNOSIS — Z794 Long term (current) use of insulin: Secondary | ICD-10-CM | POA: Insufficient documentation

## 2021-09-16 DIAGNOSIS — D649 Anemia, unspecified: Secondary | ICD-10-CM

## 2021-09-16 DIAGNOSIS — Z955 Presence of coronary angioplasty implant and graft: Secondary | ICD-10-CM | POA: Diagnosis not present

## 2021-09-16 DIAGNOSIS — Z992 Dependence on renal dialysis: Secondary | ICD-10-CM | POA: Insufficient documentation

## 2021-09-16 DIAGNOSIS — M545 Low back pain, unspecified: Secondary | ICD-10-CM

## 2021-09-16 DIAGNOSIS — R109 Unspecified abdominal pain: Secondary | ICD-10-CM | POA: Insufficient documentation

## 2021-09-16 DIAGNOSIS — Z79899 Other long term (current) drug therapy: Secondary | ICD-10-CM | POA: Insufficient documentation

## 2021-09-16 DIAGNOSIS — N186 End stage renal disease: Secondary | ICD-10-CM | POA: Diagnosis not present

## 2021-09-16 DIAGNOSIS — Z20822 Contact with and (suspected) exposure to covid-19: Secondary | ICD-10-CM | POA: Diagnosis not present

## 2021-09-16 DIAGNOSIS — M549 Dorsalgia, unspecified: Secondary | ICD-10-CM | POA: Diagnosis not present

## 2021-09-16 DIAGNOSIS — D500 Iron deficiency anemia secondary to blood loss (chronic): Secondary | ICD-10-CM | POA: Diagnosis not present

## 2021-09-16 DIAGNOSIS — K429 Umbilical hernia without obstruction or gangrene: Secondary | ICD-10-CM | POA: Diagnosis not present

## 2021-09-16 DIAGNOSIS — N2889 Other specified disorders of kidney and ureter: Secondary | ICD-10-CM | POA: Diagnosis not present

## 2021-09-16 LAB — CBC WITH DIFFERENTIAL/PLATELET
Abs Immature Granulocytes: 0.04 10*3/uL (ref 0.00–0.07)
Basophils Absolute: 0 10*3/uL (ref 0.0–0.1)
Basophils Relative: 1 %
Eosinophils Absolute: 0 10*3/uL (ref 0.0–0.5)
Eosinophils Relative: 0 %
HCT: 24.6 % — ABNORMAL LOW (ref 39.0–52.0)
Hemoglobin: 7.8 g/dL — ABNORMAL LOW (ref 13.0–17.0)
Immature Granulocytes: 1 %
Lymphocytes Relative: 10 %
Lymphs Abs: 0.7 10*3/uL (ref 0.7–4.0)
MCH: 29.8 pg (ref 26.0–34.0)
MCHC: 31.7 g/dL (ref 30.0–36.0)
MCV: 93.9 fL (ref 80.0–100.0)
Monocytes Absolute: 0.4 10*3/uL (ref 0.1–1.0)
Monocytes Relative: 6 %
Neutro Abs: 5.5 10*3/uL (ref 1.7–7.7)
Neutrophils Relative %: 82 %
Platelets: 143 10*3/uL — ABNORMAL LOW (ref 150–400)
RBC: 2.62 MIL/uL — ABNORMAL LOW (ref 4.22–5.81)
RDW: 18.6 % — ABNORMAL HIGH (ref 11.5–15.5)
WBC: 6.6 10*3/uL (ref 4.0–10.5)
nRBC: 0 % (ref 0.0–0.2)

## 2021-09-16 LAB — BASIC METABOLIC PANEL
Anion gap: 13 (ref 5–15)
BUN: 45 mg/dL — ABNORMAL HIGH (ref 8–23)
CO2: 25 mmol/L (ref 22–32)
Calcium: 8 mg/dL — ABNORMAL LOW (ref 8.9–10.3)
Chloride: 88 mmol/L — ABNORMAL LOW (ref 98–111)
Creatinine, Ser: 5.57 mg/dL — ABNORMAL HIGH (ref 0.61–1.24)
GFR, Estimated: 11 mL/min — ABNORMAL LOW (ref >60)
Glucose, Bld: 161 mg/dL — ABNORMAL HIGH (ref 70–99)
Potassium: 3.1 mmol/L — ABNORMAL LOW (ref 3.5–5.1)
Sodium: 126 mmol/L — ABNORMAL LOW (ref 135–145)

## 2021-09-16 LAB — RESP PANEL BY RT-PCR (FLU A&B, COVID) ARPGX2
Influenza A by PCR: NEGATIVE
Influenza B by PCR: NEGATIVE
SARS Coronavirus 2 by RT PCR: NEGATIVE

## 2021-09-16 MED ORDER — SODIUM CHLORIDE 0.9 % IV SOLN
500.0000 mg | Freq: Once | INTRAVENOUS | Status: AC
  Administered 2021-09-16: 500 mg via INTRAVENOUS
  Filled 2021-09-16: qty 500

## 2021-09-16 MED ORDER — HYDROMORPHONE HCL 1 MG/ML IJ SOLN
0.5000 mg | Freq: Once | INTRAMUSCULAR | Status: AC
  Administered 2021-09-16: 0.5 mg via INTRAVENOUS
  Filled 2021-09-16: qty 1

## 2021-09-16 MED ORDER — AZITHROMYCIN 250 MG PO TABS: 250.0000 mg | ORAL_TABLET | Freq: Every day | ORAL | 0 refills | Status: AC

## 2021-09-16 MED ORDER — ONDANSETRON HCL 4 MG/2ML IJ SOLN
4.0000 mg | Freq: Once | INTRAMUSCULAR | Status: AC
  Administered 2021-09-16: 4 mg via INTRAVENOUS
  Filled 2021-09-16: qty 2

## 2021-09-16 MED ORDER — AMOXICILLIN-POT CLAVULANATE 875-125 MG PO TABS: 1.0000 | ORAL_TABLET | Freq: Two times a day (BID) | ORAL | 0 refills | Status: AC

## 2021-09-16 MED ORDER — LORAZEPAM 2 MG/ML IJ SOLN: 0.5000 mg | Freq: Once | INTRAMUSCULAR | Status: DC

## 2021-09-16 MED ORDER — HYDROMORPHONE HCL 1 MG/ML IJ SOLN
1.0000 mg | Freq: Once | INTRAMUSCULAR | Status: AC
  Administered 2021-09-16: 1 mg via INTRAVENOUS
  Filled 2021-09-16: qty 1

## 2021-09-16 MED ORDER — MORPHINE SULFATE (PF) 4 MG/ML IV SOLN
4.0000 mg | Freq: Once | INTRAVENOUS | Status: AC
  Administered 2021-09-16: 4 mg via INTRAVENOUS
  Filled 2021-09-16: qty 1

## 2021-09-16 MED ORDER — SODIUM CHLORIDE 0.9 % IV SOLN
1.0000 g | Freq: Once | INTRAVENOUS | Status: AC
  Administered 2021-09-16: 1 g via INTRAVENOUS
  Filled 2021-09-16: qty 10

## 2021-09-16 NOTE — ED Provider Notes (Signed)
Physical Exam  BP (!) 142/95   Pulse (!) 105   Temp 99.4 F (37.4 C)   Resp 15   SpO2 97%   Physical Exam  ED Course/Procedures     Procedures  MDM  Care of patient assumed from previous provider at shift change. Please refer to their note for further history and plan.  In brief, patient is a 60 y.o. y/o male presenting with: - vasculopath with back pain, acute on chronic, difficulty ambulating. Missed dialysis today - PMHx: Past Medical History:  Diagnosis Date   Alcohol abuse    Anemia    Arthritis    "patient does not think so."   Atrial fibrillation ([place])    Cardiac syncope 10/07/2014   rapid A fib with 8 sec pauses on converison with syncope- temp pacing wire placed then PPM   Cataract    BILATERAL   Coronary artery disease    Diabetes mellitus    dx---been  awhile   Dysrhythmia    Encounter for care of pacemaker 10/08/2014   ESRD (end stage renal disease) ([place])    [place]   GERD (gastroesophageal reflux disease)    History of blood transfusion    Hyperlipemia    Hypertension    Osteomyelitis (H[facility])    right foot   Peripheral vascular disease ([place])    Presence of permanent cardiac pacemaker 10/08/2014   [employer]   Sinus node dysfunction ([place]) 10/08/2014    Review of ED course: - chronic anticoagulant use with eliquis, worsening anemia, refused rectal exam - no K repletion due to ESRD - s/p antibiotics - CT chest neg   Plan at the time of handoff is as follows: - pain control - no electrolyte repletion secondary to dialysis - refuses further workup of anemia   Additional MDM:  VS upon my assumption of care were sig for HTN and tachycardia. No further workup during my care.  Medications: Medications  LORazepam (ATIVAN) injection 0.5 mg (has no administration in time range)  cefTRIAXone (ROCEPHIN) 1 g in sodium chloride 0.9 % 100 mL IVPB (has no administration in time range)  azithromycin (ZITHROMAX) 500 mg in sodium  chloride 0.9 % 250 mL IVPB (has no administration in time range)  HYDROmorphone (DILAUDID) injection 1 mg (has no administration in time range)  morphine 4 MG/ML injection 4 mg (4 mg Intravenous Given 09/16/21 1028)  ondansetron (ZOFRAN) injection 4 mg (4 mg Intravenous Given 09/16/21 1028)  HYDROmorphone (DILAUDID) injection 0.5 mg (0.5 mg Intravenous Given 09/16/21 1405)   On repeat examination, patient states his pain is improved.  Palpable pulses on my exam. Hemodynamically stable and in no acute distress.  He is always tachycardic, and he is at his baseline.  Shared decision making held with patient, would like to pursue outpatient vascular surgery follow-up.  He will follow-up with regular outpatient dialysis, no indication for emergent dialysis today.  No respiratory distress, satting 95% on room air.  Discharged home in stable condition. Strict ED return precautions advised and discussed at length. Supportive care discussed. Advised close PCP follow up.  Has an upcoming outpatient follow-up appointment scheduled with vascular surgery.  Rx Augmentin and Zithromax for CAP coverage.  Patient understands and agrees with the plan.   The plan for this patient was discussed with my attending physician, who voiced agreement and who oversaw evaluation and treatment of this patient.     Note: Estate manager/land agent was used in Field seismologist of this  note.   1. Acute midline low back pain without sciatica   2. Pain   3. Acute on chronic anemia   4. Hyponatremia   5. Community acquired pneumonia, unspecified laterality         [name], DO 09/17/21 7156    Noemi Chapel, MD 09/17/21 2233

## 2021-09-16 NOTE — Discharge Instructions (Addendum)
Dear Alexander [name],  Thank you for allowing [place] to take care of you today.  We hope you begin feeling better soon.  - Please follow-up with your primary care physician or schedule an appointment to establish a primary care doctor if you do not have one already. - Please return to the Emergency Department or call 911 for chest pain, shortness of breath, severe pain, altered mental status, or if you have any reason to think you may need emergency medical care. -Call your PCP for close follow-up -Follow-up at your previously scheduled vascular surgery appointment as scheduled -Continue your normal home medications -Take antibiotics till complete, even if you are feeling better.  Return for any worsening shortness of breath or any other concerns   Sincerely,  [name], DO Department of Emergency Medicine [place]   Acute midline low back pain without sciatica  Pain - Plan: CT L-SPINE NO CHARGE, CT L-SPINE NO CHARGE  Acute on chronic anemia  Hyponatremia  Community acquired pneumonia, unspecified laterality

## 2021-09-16 NOTE — ED Triage Notes (Signed)
Patient here with chronic back pain for 3weeks to 6 months. Patient is a dialysis pt.  Patient states that he is unable to walk, able to stand with assistance.

## 2021-09-16 NOTE — ED Provider Notes (Signed)
[facility] EMERGENCY DEPARTMENT Provider Note   CSN: [id number] Arrival date & time: 09/16/21  0543    History Chief Complaint  Patient presents with   Back Pain    Alexander [name] is a 60 y.o. male with significant past medical history, see below who presents for evaluation of back pain.  Has had back pain over the last month.  Worsening.  Unable to ambulate due to significant pain.  Pain does not radiate.  He does intermittently make urine.  He denies any bowel or bladder incontinence, saddle paresthesia.  Pain improves with laying flat, worse with movement.  Was unable to go to dialysis due to pain today.  He has had prior back surgeries.  No fever, chills, chest pain, shortness of breath, abdominal pain.  No hx of PE, DVT.  Also admits to cold sensation intermittently to his left lateral calf.  He called his vascular surgery office and has FU appointment early next week, Dr. Donzetta Matters for ABI and [place] of graft. He has right BKA, left transmetatarsal amputation. No weakness, numbness, color changes to skin.   He is chronically anticoagulated on Eliquis for Afib, has not missed any doses. Denies melena or BRBPR. Denies any paresthesias, weakness.  Denies additional aggravating or relieving factors.     History obtained from patient and past medical records.  No interpreter is used.  Dialysis T,R,Sat>>> Last session Thursday, full time  HPI     Past Medical History:  Diagnosis Date   Alcohol abuse    Anemia    Arthritis    "patient does not think so."   Atrial fibrillation ([place])    Cardiac syncope 10/07/2014   rapid A fib with 8 sec pauses on converison with syncope- temp pacing wire placed then PPM   Cataract    BILATERAL   Coronary artery disease    Diabetes mellitus    dx---been  awhile   Dysrhythmia    Encounter for care of pacemaker 10/08/2014   ESRD (end stage renal disease) ([place])    [place]   GERD (gastroesophageal reflux disease)     History of blood transfusion    Hyperlipemia    Hypertension    Osteomyelitis ([place])    right foot   Peripheral vascular disease ([place])    Presence of permanent cardiac pacemaker 10/08/2014   Medtronic   Sinus node dysfunction ([place]) 10/08/2014    Patient Active Problem List   Diagnosis Date Noted   NSVT (nonsustained ventricular tachycardia)    Pancytopenia ([place])    Acute blood loss anemia 07/18/2020   Hemodialysis patient ([place]) 06/04/2020   Necrotizing glomerulonephritis 06/04/2020   Heme positive stool    Melena    Upper GI bleeding 05/23/2020   Acute GI bleeding 05/23/2020   Acute hematogenous osteomyelitis of right foot ([place]) 03/11/2020   Gangrene of right foot ([employer])    Diabetic foot ulcer associated with type 2 diabetes mellitus ([place]) 02/19/2020   Heel ulcer ([place]) 11/05/2019   Atherosclerotic PVD with ulceration ([place]) 09/09/2017   Essential hypertension, benign 02/07/2015   Renal insufficiency 02/07/2015   Post PTCA 11/09/2014   S/P PTCA (percutaneous transluminal coronary angioplasty) 11/09/2014   Pacemaker: [place] DR MRI [id number] Dual chamber pacemaker 10/08/2014 10/08/2014   Encounter for care of pacemaker 10/08/2014   Sinus node dysfunction ([place]) 10/08/2014   Cardiac asystole ([place]) 10/07/2014   Cardiac syncope 10/07/2014   Diabetes mellitus with stage 3 chronic kidney disease ([place]) 10/07/2014  CAD (coronary artery disease), native coronary artery 10/07/2014   Diabetes mellitus due to underlying condition without complications ([place]) 51/70/0174   Anemia, unspecified 05/25/2014   Syncope 04/30/2014   Atherosclerosis of native arteries of the extremities with gangrene ([place]) 12/16/2013   Volume overload 10/30/2013   Acute renal failure ([place]) 10/28/2013   PAD (peripheral artery disease) ([place]) 10/27/2013   Leukocytosis 10/21/2013   Anemia 10/21/2013   Osteomyelitis of left foot ([place]) 08/19/2013   Spinal stenosis, lumbar region, with neurogenic claudication  06/12/2012    Class: Diagnosis of   DIABETES MELLITUS, TYPE II 03/15/2009   ALCOHOL ABUSE 03/15/2009   TOBACCO USE 03/15/2009   HYPERTENSION 03/15/2009    Past Surgical History:  Procedure Laterality Date   A/V FISTULAGRAM Right 05/01/2021   Procedure: A/V FISTULAGRAM;  Surgeon: Waynetta Sandy, MD;  Location: [facility];  Service: Cardiovascular;  Laterality: Right;  failed fistulagram   ABDOMINAL AORTOGRAM W/LOWER EXTREMITY N/A 09/09/2017   Procedure: ABDOMINAL AORTOGRAM W/LOWER EXTREMITY;  Surgeon: Angelia Mould, MD;  Location: [facility];  Service: Cardiovascular;  Laterality: N/A;   ABDOMINAL AORTOGRAM W/LOWER EXTREMITY Right 11/11/2019   Procedure: ABDOMINAL AORTOGRAM W/LOWER EXTREMITY;  Surgeon: Marty Heck, MD;  Location: [facility];  Service: Cardiovascular;  Laterality: Right;   AMPUTATION Left 08/19/2013   Procedure: AMPUTATION RAY;  Surgeon: Alta Corning, MD;  Location: [facility];  Service: Orthopedics;  Laterality: Left;  ray amputation left 5th   AMPUTATION Left 10/27/2013   Procedure: AMPUTATION DIGIT-LEFT 4TH TOE, 4th and 5th metatarsal.;  Surgeon: Angelia Mould, MD;  Location: [facility];  Service: Vascular;  Laterality: Left;   AMPUTATION Left 11/04/2013   Procedure: LEFT FOOT TRANS-METATARSAL AMPUTATION WITH WOUND CLOSURE ;  Surgeon: Alta Corning, MD;  Location: [facility];  Service: Orthopedics;  Laterality: Left;   AMPUTATION Right 09/10/2017   Procedure: RIGHT FOURTH AND FIFTH TOE AMPUTATION;  Surgeon: Angelia Mould, MD;  Location: [facility];  Service: Vascular;  Laterality: Right;   AMPUTATION Right 02/19/2020   Procedure: AMPUTATION RIGHT 3RD TOE;  Surgeon: Angelia Mould, MD;  Location: [place] [place];  Service: Vascular;  Laterality: Right;   AMPUTATION Right 03/11/2020   Procedure: RIGHT BELOW KNEE AMPUTATION;  Surgeon: Newt Minion, MD;  Location: [facility];  Service: Orthopedics;  Laterality: Right;    APPLICATION OF WOUND VAC Right 02/19/2020   Procedure: Application Of Wound Vac Right foot;  Surgeon: Angelia Mould, MD;  Location: [facility];  Service: Vascular;  Laterality: Right;   AV FISTULA PLACEMENT Right 03/27/2021   Procedure: RIGHT ARM ARTERIOVENOUS (AV) FISTULA;  Surgeon: Angelia Mould, MD;  Location: [facility];  Service: Vascular;  Laterality: Right;   AV FISTULA PLACEMENT Right 05/10/2021   Procedure: RIGHT ARM ARTERIOVENOUS (AV) FISTULA;  Surgeon: Waynetta Sandy, MD;  Location: [facility];  Service: Vascular;  Laterality: Right;   [place] Right 08/11/2021   Procedure: RIGHT ARM SECOND STAGE BASILIC VEIN TRANSPOSITION;  Surgeon: Waynetta Sandy, MD;  Location: [facility];  Service: Vascular;  Laterality: Right;   BIOPSY  07/20/2020   Procedure: BIOPSY;  Surgeon: Irving Copas., MD;  Location: [facility];  Service: Gastroenterology;;   CARDIAC CATHETERIZATION  10/07/2014   Procedure: LEFT HEART CATH AND CORONARY ANGIOGRAPHY;  Surgeon: Laverda Page, MD;  Location: [facility] C[facility] LAB;  Service: Cardiovascular;;   COLONOSCOPY W/ POLYPECTOMY  COLONOSCOPY WITH PROPOFOL N/A 05/25/2020   Procedure: COLONOSCOPY WITH PROPOFOL;  Surgeon: Gatha Mayer, MD;  Location: [facility];  Service: Endoscopy;  Laterality: N/A;   EMBOLECTOMY Right 09/12/2017   Procedure: Thrombectomy  & Redo Right Below Knee Popliteal Artery Bypass Graft  ;  Surgeon: Waynetta Sandy, MD;  [facility];  Service: Vascular;  Laterality: Right;   ENTEROSCOPY N/A 07/20/2020   Procedure: ENTEROSCOPY;  Surgeon: Rush Landmark Telford Nab., MD;  Location: [facility];  Service: Gastroenterology;  Laterality: N/A;   ESOPHAGOGASTRODUODENOSCOPY (EGD) WITH PROPOFOL N/A 05/24/2020   Procedure: ESOPHAGOGASTRODUODENOSCOPY (EGD) WITH PROPOFOL;  Surgeon: Ladene Artist, MD;  [facility];  Service: Endoscopy;  Laterality: N/A;   EYE SURGERY  Bilateral    cataract   FEMORAL-TIBIAL BYPASS GRAFT Left 10/27/2013   Procedure: BYPASS GRAFT LEFT FEMORAL- POSTERIOR TIBIAL ARTERY;  Surgeon: Angelia Mould, MD;  Location: [facility];  Service: Vascular;  Laterality: Left;   FEMORAL-TIBIAL BYPASS GRAFT Right 09/10/2017   Procedure: RIGHT SUPERFICIAL  FEMORAL ARTERY-BELOW KNEE POPLITEAL ARTERY BYPASS GRAFT WITH VEIN;  Surgeon: Angelia Mould, MD;  Location: [place];  Service: Vascular;  Laterality: Right;   GIVENS CAPSULE STUDY N/A 07/20/2020   Procedure: GIVENS CAPSULE STUDY;  Surgeon: Irving Copas., MD;  Location: [place];  Service: Gastroenterology;  Laterality: N/A;  capsule placed through scope into duodenum @ 0936   I & D EXTREMITY Left 10/27/2013   Procedure: IRRIGATION AND DEBRIDEMENT EXTREMITY- LEFT FOOT;  Surgeon: Angelia Mould, MD;  Location: [facility];  Service: Vascular;  Laterality: Left;   I & D EXTREMITY Right 01/26/2020   Procedure: IRRIGATION AND DEBRIDEMENT EXTREMITY RIGHT FOOT WOUND;  Surgeon: Angelia Mould, MD;  Location: [place] [facility];  Service: Vascular;  Laterality: Right;   INSERT / REPLACE / REMOVE PACEMAKER     INTRAOPERATIVE ARTERIOGRAM Right 09/10/2017   Procedure: INTRA OPERATIVE ARTERIOGRAM;  Surgeon: Angelia Mould, MD;  Location: [facility];  Service: Vascular;  Laterality: Right;   IR ANGIOGRAM FOLLOW UP STUDY  09/09/2017   IR FLUORO GUIDE CV LINE RIGHT  05/31/2020   IR US GUIDE VASC ACCESS RIGHT  06/01/2020   LEFT HEART CATH AND CORONARY ANGIOGRAPHY N/A 08/19/2020   Procedure: LEFT HEART CATH AND CORONARY ANGIOGRAPHY;  Surgeon: Adrian Prows, MD;  Location: [facility];  Service: Cardiovascular;  Laterality: N/A;   LEFT HEART CATHETERIZATION WITH CORONARY ANGIOGRAM N/A 11/09/2014   Procedure: LEFT HEART CATHETERIZATION WITH CORONARY ANGIOGRAM;  Surgeon: Laverda Page, MD;  Location: [facility] C[facility] LAB;  Service: Cardiovascular;  Laterality: N/A;   LOWER EXTREMITY ANGIOGRAM  Right 11/08/2015   Procedure: Lower Extremity Angiogram;  Surgeon: Serafina Mitchell, MD;  Location: [facility];  Service: Cardiovascular;  Laterality: Right;   LUMBAR LAMINECTOMY  06/13/2012   Procedure: MICRODISCECTOMY LUMBAR LAMINECTOMY;  Surgeon: Jessy Oto, MD;  Location: [facility];  Service: Orthopedics;  Laterality: N/A;  Central laminectomy L2-3, L3-4, L4-5   PACEMAKER INSERTION  10/08/2014   MDT Advisa MRI compatible dual chamber pacemaker implanted by Dr Caryl Comes for syncope with post-termination pauses   PERCUTANEOUS CORONARY STENT INTERVENTION (PCI-S)  11/09/2014   des to lad & distal circumflex         Dr  [name]   PERIPHERAL VASCULAR BALLOON ANGIOPLASTY Right 11/11/2019   Procedure: PERIPHERAL VASCULAR BALLOON ANGIOPLASTY;  Surgeon: Marty Heck, MD;  Location: [facility];  Service: Cardiovascular;  Laterality: Right;  below knee popliteal,  tibioperoneal trunk, posterior tibial arteries   PERIPHERAL VASCULAR CATHETERIZATION N/A 11/08/2015   Procedure: Abdominal Aortogram;  Surgeon: Serafina Mitchell, MD;  Location: [facility];  Service: Cardiovascular;  Laterality: N/A;   PERIPHERAL VASCULAR CATHETERIZATION Right 11/08/2015   Procedure: Peripheral Vascular Atherectomy;  Surgeon: Serafina Mitchell, MD;  Location: [facility];  Service: Cardiovascular;  Laterality: Right;   PERIPHERAL VASCULAR CATHETERIZATION N/A 10/10/2016   Procedure: Lower Extremity Angiography;  Surgeon: Waynetta Sandy, MD;  Location: [facility];  Service: Cardiovascular;  Laterality: N/A;   PERIPHERAL VASCULAR CATHETERIZATION Right 10/10/2016   Procedure: Peripheral Vascular Atherectomy;  Surgeon: Waynetta Sandy, MD;  Location: [place] [place] [facility];  Service: Cardiovascular;  Laterality: Right;  Popliteal   PERMANENT PACEMAKER INSERTION N/A 10/08/2014   Procedure: PERMANENT PACEMAKER INSERTION;  Surgeon: Deboraha Sprang, MD;  Location: [facility] CATH LAB;  Service:  Cardiovascular;  Laterality: N/A;   SUBMUCOSAL TATTOO INJECTION  07/20/2020   Procedure: SUBMUCOSAL TATTOO INJECTION;  Surgeon: Irving Copas., MD;  Location: [facility];  Service: Gastroenterology;;   TEMPORARY PACEMAKER INSERTION Bilateral 10/07/2014   Procedure: TEMPORARY PACEMAKER INSERTION;  Surgeon: Laverda Page, MD;  Location[facility] LAB;  Service: Cardiovascular;  Laterality: Bilateral;   WOUND DEBRIDEMENT Right 02/19/2020   Procedure: DEBRIDEMENT WOUND RIGHT FOOT;  Surgeon: Angelia Mould, MD;  Location: [facility] OR;  Service: Vascular;  Laterality: Right;       Family History  Problem Relation Age of Onset   Diabetes type II Mother    Hypertension Mother    Diabetes Mother    Liver cancer Father    Diabetes type II Sister    Breast cancer Sister    Diabetes Sister    Hypertension Sister    Diabetes type II Brother    Kidney failure Brother    Diabetes Brother    Hypertension Brother    Diabetes type II Sister     Social History   Tobacco Use   Smoking status: Former    Packs/day: 1.50    Years: 30.00    Pack years: 45.00    Types: Cigarettes    Quit date: 10/07/2014    Years since quitting: 6.9   Smokeless tobacco: Never  Vaping Use   Vaping Use: Never used  Substance Use Topics   Alcohol use: Yes    Alcohol/week: 21.0 standard drinks    Types: 21 Glasses of wine per week   Drug use: No    Home Medications Prior to Admission medications   Medication Sig Start Date End Date Taking? Authorizing Provider  ACCU-CHEK FASTCLIX LANCETS MISC Check blood sugar TID & QHS 10/01/14  Yes Advani, Deepak, MD  acetaminophen (TYLENOL) 500 MG tablet Take 1,000 mg by mouth every 6 (six) hours as needed for moderate pain or headache.   Yes [provider]  apixaban (ELIQUIS) 5 MG TABS tablet Take 1 tablet (5 mg total) by mouth 2 (two) times daily. Patient taking differently: Take 5 mg by mouth daily. 08/24/20 04/26/24 Yes Cantwell, Celeste C,  PA-C  atorvastatin (LIPITOR) 20 MG tablet Take 1 tablet by mouth once daily 08/29/21  Yes Cantwell, Celeste C, PA-C  blood glucose meter kit and supplies KIT Check blood sugar TID & QHS 02/10/15  Yes Advani, Deepak, MD  Blood Glucose Monitoring Suppl (ACCU-CHEK ADVANTAGE DIABETES) kit Use as instructed 10/01/14  Yes Advani, Deepak, MD  furosemide (LASIX) 80 MG tablet Take 80 mg by mouth See  admin instructions. Take 1 tablet (80 mg) by mouth on (non-dialysis days) Mondays, Wednesdays, Fridays & Sundays in the morning. 02/26/21  Yes [provider]  glucose blood (ACCU-CHEK AVIVA PLUS) test strip Use as instructed 10/01/14  Yes Advani, Deepak, MD  glucose blood (CHOICE DM FORA G20 TEST STRIPS) test strip Check blood sugar TID & QHS 02/10/15  Yes Advani, Deepak, MD  glucose blood test strip Use as instructed 11/20/13  Yes Reyne Dumas, MD  insulin NPH-regular Human (70-30) 100 UNIT/ML injection Inject 25 Units into the skin 2 (two) times daily with a meal. Patient taking differently: Inject 50-80 Units into the skin 2 (two) times daily as needed (blood sugar over 200). 07/23/20  Yes Regalado, Belkys A, MD  metoprolol tartrate (LOPRESSOR) 25 MG tablet Take 1 tablet (25 mg total) by mouth 2 (two) times daily. 05/24/21 05/19/22 Yes Adrian Prows, MD  Multiple Vitamin (MULTIVITAMIN WITH MINERALS) TABS tablet Take 1 tablet by mouth daily.   Yes [provider]  oxyCODONE-acetaminophen (PERCOCET) 5-325 MG tablet Take 1 tablet by mouth every 6 (six) hours as needed. Patient taking differently: Take 1 tablet by mouth every 6 (six) hours as needed for moderate pain. 08/11/21  Yes Rhyne, Hulen Shouts, PA-C    Allergies    Cytoxan [cyclophosphamide], Keflex [cephalexin], and Pletal [cilostazol]  Review of Systems   Review of Systems  Constitutional: Negative.   HENT: Negative.    Respiratory: Negative.    Cardiovascular: Negative.   Gastrointestinal: Negative.   Genitourinary: Negative.    Musculoskeletal:  Positive for back pain and gait problem.  Skin:  Positive for color change.  All other systems reviewed and are negative.  Physical Exam Updated Vital Signs BP (!) 160/147   Pulse 76   Temp 99.4 F (37.4 C)   Resp 16   SpO2 97%   Physical Exam Vitals and nursing note reviewed.  Constitutional:      General: He is not in acute distress.    Appearance: He is well-developed. He is obese. He is ill-appearing (Chronically ill appearing). He is not toxic-appearing or diaphoretic.  HENT:     Head: Normocephalic and atraumatic.     Nose: Nose normal.     Mouth/Throat:     Mouth: Mucous membranes are moist.  Eyes:     Pupils: Pupils are equal, round, and reactive to light.  Cardiovascular:     Rate and Rhythm: Normal rate and regular rhythm.     Pulses:          Radial pulses are 2+ on the right side and 2+ on the left side.       Dorsalis pedis pulses are detected w/ Doppler on the left side. Right dorsalis pedis pulse not accessible.        Right posterior tibial pulse not accessible.     Heart sounds: Normal heart sounds.     Comments: Right tunneled catheter right chest wall.  No surrounding erythema or warmth Pulmonary:     Effort: Pulmonary effort is normal. No respiratory distress.     Breath sounds: Normal breath sounds and air entry.     Comments: Clear bil, speaks in full sentences without difficulty Chest:     Comments: Right dialysis access, pacer left Abdominal:     General: Bowel sounds are normal. There is no distension.     Palpations: Abdomen is soft.     Tenderness: There is no abdominal tenderness. There is no guarding or rebound.  Comments: Soft, nontender  Musculoskeletal:     Cervical back: Normal range of motion and neck supple.     Lumbar back: Tenderness present. No swelling. Decreased range of motion. Negative right straight leg raise test and negative left straight leg raise test.       Back:     Right hip: Normal.     Left  hip: Normal.     Comments: Dialysis graft with thrill to RUE.   Midline lumbar tenderness.  No skin changes to overlying remote surgical scarring.  Negative straight leg raise bilaterally. Non tender SI bil. Right lower extremity BKA.  Left transmetatarsal amputation.  No bony tenderness to extremities. Compartments soft. No LE edema.     Right Lower Extremity: Right leg is amputated below knee.  Feet:     Comments: Left transmetatarsal amputation Skin:    General: Skin is warm and dry.     Capillary Refill: Capillary refill takes less than 2 seconds.     Coloration: Skin is pale. Skin is not mottled.     Comments: Cold sensation to lateral and medial aspect left calf.  Left foot and left thigh well perfused, warm. No cyanosis, mottled skin.  Appears pale in general  Neurological:     General: No focal deficit present.     Mental Status: He is alert and oriented to person, place, and time.     Cranial Nerves: Cranial nerves 2-12 are intact.     Sensory: Sensation is intact.     Motor: Motor function is intact.     Comments: Full strength bilaterally Intact sensation bilaterally Unable to walk 2/2 pain   ED Results / Procedures / Treatments   Labs (all labs ordered are listed, but only abnormal results are displayed) Labs Reviewed  CBC WITH DIFFERENTIAL/PLATELET - Abnormal; Notable for the following components:      Result Value   RBC 2.62 (*)    Hemoglobin 7.8 (*)    HCT 24.6 (*)    RDW 18.6 (*)    Platelets 143 (*)    All other components within normal limits  BASIC METABOLIC PANEL - Abnormal; Notable for the following components:   Sodium 126 (*)    Potassium 3.1 (*)    Chloride 88 (*)    Glucose, Bld 161 (*)    BUN 45 (*)    Creatinine, Ser 5.57 (*)    Calcium 8.0 (*)    GFR, Estimated 11 (*)    All other components within normal limits  RESP PANEL BY RT-PCR (FLU A&B, COVID) [id number]   EKG None  Radiology CT Abdomen Pelvis Wo Contrast  Result Date:  09/16/2021 CLINICAL DATA:  Chronic back pain.  Dialysis. EXAM: CT ABDOMEN AND PELVIS WITHOUT CONTRAST TECHNIQUE: Multidetector CT imaging of the abdomen and pelvis was performed following the standard protocol without IV contrast. COMPARISON:  Renal ultrasound 05/23/2020 FINDINGS: Lower chest: Mild cardiomegaly. Small pericardial effusion. Pacer device noted. Aortic and coronary artery atherosclerosis. Hepatobiliary: Questionable tiny gallstone on image 55 series 7. Nonspecific high-density not nodularity along the medial margin of the splenic capsule measuring 1.0 by 0.6 cm on image 85 series 6. Pancreas: Unremarkable Spleen: The spleen measures 13.8 by 13.8 by 7.9 cm (volume = 790 cm^3), compatible with splenomegaly. Adrenals/Urinary Tract: Vascular calcifications in both renal hila. No definite urinary tract calculi. Nondistended urinary bladder with borderline wall thickening. Stomach/Bowel: There is a small of ascites in the right lower quadrant below the cecum, but with normal appearance  of the appendix, terminal ileum and cecum without findings of pneumatosis, bowel wall thickening, or other specific bowel abnormality. There is also a small amount of pelvic ascites. Vascular/Lymphatic: Substantial atherosclerotic calcifications. No pathologic adenopathy. Reproductive: Dystrophic calcifications in the prostate gland. Other: Presacral edema is nonspecific. Upper pelvic mesenteric edema so tracking slightly along the pelvic sidewalls, nonspecific. Small amount of pelvic ascites and right lower quadrant ascites. Musculoskeletal: Bridging spurring of the left sacroiliac joint. Postoperative findings in the lower lumbar spine including posterior decompression. Multilevel lumbar foraminal narrowing, please see dedicated lumbar spine CT report. Umbilical hernia contains adipose tissue. Postoperative findings in the subcutaneous tissues anterior to the right common femoral artery. IMPRESSION: 1. Small amount of  ascites noted, along with deep mesenteric edema particularly in the pelvis and some presacral edema. I do not observe definite bowel wall thickening, pneumatosis, or a specific bowel abnormality. 2. Extensive atherosclerosis. Coronary atherosclerosis. Aortic Atherosclerosis (ICD10-I70.0). 3. Mild cardiomegaly with small pericardial effusion. 4. Splenomegaly. 5. Possible cholelithiasis. 6. Umbilical hernia contains adipose tissue. Electronically Signed   By: Van Clines M.D.   On: 09/16/2021 12:41   DG Chest 2 View  Result Date: 09/16/2021 CLINICAL DATA:  Back pain. History of atrial fibrillation. Possible pulmonary edema, the patient missed dialysis. EXAM: CHEST - 2 VIEW COMPARISON:  05/24/2020 FINDINGS: Mild enlargement of the cardiopericardial silhouette. 3.2 by 3.2 cm left suprahilar mass like density could represent malignancy or infectious process, less likely to represent confluent edema. Mild obscuration of the medial left hemidiaphragm although no discrete airspace opacity is visible in this region on the lateral projection, probably incidental. The right lung appears clear. Apical lordotic projection. Right internal jugular dialysis catheter tip: SVC. Dual lead pacer noted. IMPRESSION: 1. Left suprahilar mass like density, 3.2 cm in diameter, could reflect pneumonia, malignancy, or less likely confluent edema/atelectasis. Chest CT is recommended for further characterization. 2. Stable cardiomegaly. No other findings of pulmonary venous hypertension or edema. Electronically Signed   By: Van Clines M.D.   On: 09/16/2021 12:28   CT Chest Wo Contrast  Result Date: 09/16/2021 CLINICAL DATA:  60 year old male with a history of back pain and cardiomegaly EXAM: CT CHEST WITHOUT CONTRAST TECHNIQUE: Multidetector CT imaging of the chest was performed following the standard protocol without IV contrast. COMPARISON:  No prior CT FINDINGS: Cardiovascular: Cardiomegaly with trace pericardial  fluid/thickening. Pacing leads terminate within the right atrium, apex right ventricle. Partially imaged hemodialysis catheter from right IJ approach. Calcifications of the left main, left anterior descending, circumflex, right coronary arteries. Normal course caliber and contour of the thoracic aorta with calcifications of the aortic arch and the branch vessels. Mediastinum/Nodes: Multiple small lymph nodes of the mediastinum. Borderline enlarged nodes in the prevascular nodal station. Unremarkable thoracic inlet. Unremarkable course of the thoracic esophagus. Lungs/Pleura: Confluent nodule of the right upper lobe measures 12 mm. Confluent airspace opacity of the left upper lobe in a peribronchovascular distribution expanding from the hilum towards the left apex. There is peripheral ground-glass opacity of the more confluent central region. No interlobular septal thickening. No pneumothorax or pleural effusion. Central airways are patent. Upper Abdomen: Liver steatosis. Diameter of the spleen measures greater than 15 cm. No acute finding of the upper abdomen. Musculoskeletal: No acute displaced fracture. Mild degenerative changes of the spine. IMPRESSION: Confluence/ground-glass airspace opacity of the left upper lobe, most likely lobar pneumonia, with a contralateral right upper lobe nodule, most likely secondary site of multifocal infection. Follow-up chest CT is recommended once the patient  has been treated for this presumed pneumonia to assure resolution of both locations. Aortic atherosclerosis and coronary artery disease. Aortic Atherosclerosis (ICD10-I70.0). Additional ancillary findings as above. Electronically Signed   By: Corrie Mckusick D.O.   On: 09/16/2021 14:40   CT L-SPINE NO CHARGE  Result Date: 09/16/2021 CLINICAL DATA:  Low back pain, trauma EXAM: CT LUMBAR SPINE WITHOUT CONTRAST TECHNIQUE: Multidetector CT imaging of the lumbar spine was performed without intravenous contrast administration.  Multiplanar CT image reconstructions were also generated. COMPARISON:  06/13/2012 lumbar radiographs FINDINGS: Segmentation: The lowest lumbar type non-rib-bearing vertebra is labeled as L5. Alignment: No vertebral subluxation is observed. Vertebrae: Congenitally short pedicles in the lumbar spine. Posterior decompression from the L2-3 level through the L4-5 level. Loss of intervertebral disc height and vacuum disc phenomenon at L5-S1. No lumbar spine fracture or acute subluxation is identified. Bridging spurring of the left sacroiliac joint noted. Paraspinal and other soft tissues: Discussed under dedicated CT abdomen. Disc levels: T12-L1: Unremarkable. L1-2: Unremarkable. L2-3: Mild bilateral foraminal stenosis due to disc bulge and facet arthropathy. L3-4: Mild bilateral foraminal stenosis due to disc bulge facet spurring. L4-5: Moderate left and mild right foraminal stenosis and borderline central narrowing of the thecal sac due to disc bulge and facet arthropathy. Suspected small central disc protrusion. L5-S1: Mild bilateral foraminal stenosis due to intervertebral and facet spurring. IMPRESSION: 1. Lumbar spondylosis, degenerative disc disease, and short pedicles causing moderate impingement at L4-5 and mild impingement at L2-3, L3-4, and L5-S1 is noted above. Patient has had prior posterior decompression from the L2-3 level through the L4-5 level. 2. Bridging spurring of the left sacroiliac joint anteriorly. Electronically Signed   By: Van Clines M.D.   On: 09/16/2021 12:46    Procedures Procedures   Medications Ordered in ED Medications  LORazepam (ATIVAN) injection 0.5 mg (0.5 mg Intravenous Patient Refused/Not Given 09/16/21 1503)  cefTRIAXone (ROCEPHIN) 1 g in sodium chloride 0.9 % 100 mL IVPB (1 g Intravenous New Bag/Given 09/16/21 1514)  azithromycin (ZITHROMAX) 500 mg in sodium chloride 0.9 % 250 mL IVPB (has no administration in time range)  morphine 4 MG/ML injection 4 mg (4 mg  Intravenous Given 09/16/21 1028)  ondansetron (ZOFRAN) injection 4 mg (4 mg Intravenous Given 09/16/21 1028)  HYDROmorphone (DILAUDID) injection 0.5 mg (0.5 mg Intravenous Given 09/16/21 1405)  HYDROmorphone (DILAUDID) injection 1 mg (1 mg Intravenous Given 09/16/21 1509)   ED Course  I have reviewed the triage vital signs and the nursing notes.  Pertinent labs & imaging results that were available during my care of the patient were reviewed by me and considered in my medical decision making (see chart for details).  Here for multiple complaints  Worsening back pain x >1 month. Cannot ambulate due to pain. Worse with movement, better with position changes. Afebrile, non septic, chronically ill appearing.  Heart and lungs clear.  Abdomen soft.  Diffuse tenderness to midline lumbar region, negative straight leg raise bilaterally.  Also with intermittent Cold calf x 1 week. Called vascular. Has ABI and graft duplex for next week and apt with Dr. Donzetta Matters. Left foot, thigh warm, Doppler pulse to LLE. Plan to touch base with vascular for rec  CONSULT with Dr. Carlis Abbott with Vascular surgery. Given patient has intact motor function, sensation and has doppler pulse> Patient can keep his appointment with Vascular early next week with Dr. Donzetta Matters. No imaging needed in ED today. Patent graft early this summer. Can FU outpatient.  RLE with amputation well perfused. No  overlying skin changes  Labs and imaging personally reviewed and interpreted:  CBC without leukocytosis, hemoglobin 7.8, two-point drop from baseline, patient denies any melena, bright blood per rectum.  Refused rectal exam to assess her occult blood Metabolic panel sodium 661, potassium 3.1, BUN 45, creatinine 5.57 COVID< Flu neg CT AP with mild ascites and edema, no obvious infectious process CT l spine with Disc impingement, impingement multiple lumbar areas Chest x-ray with mass/infiltrate/loculated edema. Rec CT chest  Went to reassess patient.   Unable to ambulate due to the pain.  We will give an additional round of pain medication.  We will get MRI given significant pain greater than 4 weeks, inability to walk  With regards to his two-point drop in hemoglobin.  Patient denies any melena, blood per rectum.  He refuses rectal exam to assess for any occult blood at this time.  Denies any other spontaneous bleeding and I do not see any obvious bleed on my exam.  MRI tech has let me know that patient cannot have MRI performed due to cardiac service not here for pacemaker.  Soonest they could do this would be on Tuesday.  We will try pain control patient to see if we can get him to follow-up outpatient for MRI. Low suspicion for infectious process, AAA, dissection, cauda equina at this time.  CT chest shows likely multifocal pneumonia? Patient denies any cough, chest pain, shortness of breath.  No leukocytosis or fever.  Will give Rocephin and Zithromax, treat for CAP.  Does have some mild tachycardia however A. fib high 90s, low 100s.  Chronically anticoagulated.  Low suspicion for sepsis. Does have elevated PORT score.  Care transferred to oncoming provider who will follow up on reassessment determine disposition.  Lab wise he does not need emergent dialysis.    MDM Rules/Calculators/A&P                            Final Clinical Impression(s) / ED Diagnoses Final diagnoses:  Acute midline low back pain without sciatica  Acute on chronic anemia  Hyponatremia  Community acquired pneumonia, unspecified laterality    Rx / DC Orders ED Discharge Orders     None        Latrease Kunde A, PA-C 09/16/21 1530    Davonna Belling, MD 09/16/21 1553

## 2021-09-18 DIAGNOSIS — N186 End stage renal disease: Secondary | ICD-10-CM | POA: Diagnosis not present

## 2021-09-18 DIAGNOSIS — E0821 Diabetes mellitus due to underlying condition with diabetic nephropathy: Secondary | ICD-10-CM | POA: Diagnosis not present

## 2021-09-18 DIAGNOSIS — T8249XA Other complication of vascular dialysis catheter, initial encounter: Secondary | ICD-10-CM | POA: Diagnosis not present

## 2021-09-18 DIAGNOSIS — N2581 Secondary hyperparathyroidism of renal origin: Secondary | ICD-10-CM | POA: Diagnosis not present

## 2021-09-18 DIAGNOSIS — Z992 Dependence on renal dialysis: Secondary | ICD-10-CM | POA: Diagnosis not present

## 2021-09-18 DIAGNOSIS — D689 Coagulation defect, unspecified: Secondary | ICD-10-CM | POA: Diagnosis not present

## 2021-09-19 DIAGNOSIS — E0821 Diabetes mellitus due to underlying condition with diabetic nephropathy: Secondary | ICD-10-CM | POA: Diagnosis not present

## 2021-09-19 DIAGNOSIS — T8249XA Other complication of vascular dialysis catheter, initial encounter: Secondary | ICD-10-CM | POA: Diagnosis not present

## 2021-09-19 DIAGNOSIS — N186 End stage renal disease: Secondary | ICD-10-CM | POA: Diagnosis not present

## 2021-09-19 DIAGNOSIS — N2581 Secondary hyperparathyroidism of renal origin: Secondary | ICD-10-CM | POA: Diagnosis not present

## 2021-09-19 DIAGNOSIS — D689 Coagulation defect, unspecified: Secondary | ICD-10-CM | POA: Diagnosis not present

## 2021-09-19 DIAGNOSIS — Z992 Dependence on renal dialysis: Secondary | ICD-10-CM | POA: Diagnosis not present

## 2021-09-20 ENCOUNTER — Ambulatory Visit (HOSPITAL_COMMUNITY)
Admission: RE | Admit: 2021-09-20 | Discharge: 2021-09-20 | Disposition: A | Discharge status: Home / Self Care | Payer: Medicare Other | Source: Ambulatory Visit | Attending: Vascular Surgery | Admitting: Vascular Surgery

## 2021-09-20 ENCOUNTER — Other Ambulatory Visit: Payer: Self-pay

## 2021-09-20 ENCOUNTER — Ambulatory Visit (INDEPENDENT_AMBULATORY_CARE_PROVIDER_SITE_OTHER): Payer: Medicare Other | Admitting: Vascular Surgery

## 2021-09-20 ENCOUNTER — Ambulatory Visit (INDEPENDENT_AMBULATORY_CARE_PROVIDER_SITE_OTHER)
Admission: RE | Admit: 2021-09-20 | Discharge: 2021-09-20 | Disposition: A | Discharge status: Home / Self Care | Payer: Medicare Other | Source: Ambulatory Visit | Attending: Vascular Surgery | Admitting: Vascular Surgery

## 2021-09-20 ENCOUNTER — Encounter: Payer: Self-pay | Admitting: Vascular Surgery

## 2021-09-20 VITALS — BP 145/65 | HR 100 | Temp 98.1°F | Resp 20 | Ht 72.0 in | Wt 260.0 lb

## 2021-09-20 DIAGNOSIS — I739 Peripheral vascular disease, unspecified: Secondary | ICD-10-CM

## 2021-09-20 DIAGNOSIS — N186 End stage renal disease: Secondary | ICD-10-CM

## 2021-09-20 NOTE — Progress Notes (Signed)
Patient ID: Alexander [name], male   DOB: [birth year], 60 y.o.   MRN: [id number]  Reason for Consult: Follow-up   Referred by Tisovec, [name], MD  Subjective:     HPI:  Alexander [name] is a 60 y.o. male history of right lower extremity bypass with subsequent right below-knee amputation.  He also has a remote history of a left femoral to TP trunk bypass.  More recently he has undergone two-stage basilic vein fistula in the right arm.  They are now beginning to use this for dialysis but he also still has a catheter.  He states that he is in the last few weeks had severe back pain and also on a few occasions of his severe leg pain mostly on the medial knee that has prevented him from even be able to stand up from his wheelchair.  He does wear a prosthetic on the right lower extremity.  States that his left lower extremity as well as his bilateral hands do feel cold all the time.  Past Medical History:  Diagnosis Date   Alcohol abuse    Anemia    Arthritis    "patient does not think so."   Atrial fibrillation ([place])    Cardiac syncope 10/07/2014   rapid A fib with 8 sec pauses on converison with syncope- temp pacing wire placed then PPM   Cataract    BILATERAL   Coronary artery disease    Diabetes mellitus    dx---been  awhile   Dysrhythmia    Encounter for care of pacemaker 10/08/2014   ESRD (end stage renal disease) ([place])    [place]   GERD (gastroesophageal reflux disease)    History of blood transfusion    Hyperlipemia    Hypertension    Osteomyelitis (HCC)    right foot   Peripheral vascular disease (HCC)    Presence of permanent cardiac pacemaker 10/08/2014   [employer]   Sinus node dysfunction ([place]) 10/08/2014   Family History  Problem Relation Age of Onset   Diabetes type II Mother    Hypertension Mother    Diabetes Mother    Liver cancer Father    Diabetes type II Sister    Breast cancer Sister    Diabetes Sister    Hypertension Sister     Diabetes type II Brother    Kidney failure Brother    Diabetes Brother    Hypertension Brother    Diabetes type II Sister    Past Surgical History:  Procedure Laterality Date   A/V FISTULAGRAM Right 05/01/2021   Procedure: A/V FISTULAGRAM;  Surgeon: Waynetta Sandy, MD;  Location: [facility];  Service: Cardiovascular;  Laterality: Right;  failed fistulagram   ABDOMINAL AORTOGRAM W/LOWER EXTREMITY N/A 09/09/2017   Procedure: ABDOMINAL AORTOGRAM W/LOWER EXTREMITY;  Surgeon: Angelia Mould, MD;  Location: [facility];  Service: Cardiovascular;  Laterality: N/A;   ABDOMINAL AORTOGRAM W/LOWER EXTREMITY Right 11/11/2019   Procedure: ABDOMINAL AORTOGRAM W/LOWER EXTREMITY;  Surgeon: Marty Heck, MD;  Location: [facility];  Service: Cardiovascular;  Laterality: Right;   AMPUTATION Left 08/19/2013   Procedure: AMPUTATION RAY;  Surgeon: Alta Corning, MD;  Location: [place];  Service: Orthopedics;  Laterality: Left;  ray amputation left 5th   AMPUTATION Left 10/27/2013   Procedure: AMPUTATION DIGIT-LEFT 4TH TOE, 4th and 5th metatarsal.;  Surgeon: Angelia Mould, MD;  Location: [place];  Service: Vascular;  Laterality: Left;  AMPUTATION Left 11/04/2013   Procedure: LEFT FOOT TRANS-METATARSAL AMPUTATION WITH WOUND CLOSURE ;  Surgeon: Alta Corning, MD;  Location: [place];  Service: Orthopedics;  Laterality: Left;   AMPUTATION Right 09/10/2017   Procedure: RIGHT FOURTH AND FIFTH TOE AMPUTATION;  Surgeon: Angelia Mould, MD;  Location: [place];  Service: Vascular;  Laterality: Right;   AMPUTATION Right 02/19/2020   Procedure: AMPUTATION RIGHT 3RD TOE;  Surgeon: Angelia Mould, MD;  Location: [place];  Service: Vascular;  Laterality: Right;   AMPUTATION Right 03/11/2020   Procedure: RIGHT BELOW KNEE AMPUTATION;  Surgeon: Newt Minion, MD;  Location: [place];  Service: Orthopedics;  Laterality: Right;   APPLICATION OF WOUND VAC Right 02/19/2020    Procedure: Application Of Wound Vac Right foot;  Surgeon: Angelia Mould, MD;  Location: [place];  Service: Vascular;  Laterality: Right;   AV FISTULA PLACEMENT Right 03/27/2021   Procedure: RIGHT ARM ARTERIOVENOUS (AV) FISTULA;  Surgeon: Angelia Mould, MD;  Location: [place];  Service: Vascular;  Laterality: Right;   AV FISTULA PLACEMENT Right 05/10/2021   Procedure: RIGHT ARM ARTERIOVENOUS (AV) FISTULA;  Surgeon: Waynetta Sandy, MD;  Location: [place];  Service: Vascular;  Laterality: Right;   [place] Right 08/11/2021   Procedure: RIGHT ARM SECOND STAGE BASILIC VEIN TRANSPOSITION;  Surgeon: Waynetta Sandy, MD;  Location: [place];  Service: Vascular;  Laterality: Right;   BIOPSY  07/20/2020   Procedure: BIOPSY;  Surgeon: Irving Copas., MD;  Location: [place];  Service: Gastroenterology;;   CARDIAC CATHETERIZATION  10/07/2014   Procedure: LEFT HEART CATH AND CORONARY ANGIOGRAPHY;  Surgeon: Laverda Page, MD;  Location: [facility];  Service: Cardiovascular;;   COLONOSCOPY W/ POLYPECTOMY     COLONOSCOPY WITH PROPOFOL N/A 05/25/2020   Procedure: COLONOSCOPY WITH PROPOFOL;  Surgeon: Gatha Mayer, MD;  Location: [facility];  Service: Endoscopy;  Laterality: N/A;   EMBOLECTOMY Right 09/12/2017   Procedure: Thrombectomy  & Redo Right Below Knee Popliteal Artery Bypass Graft  ;  Surgeon: Waynetta Sandy, MD;  Location: [facility] OR;  Service: Vascular;  Laterality: Right;   ENTEROSCOPY N/A 07/20/2020   Procedure: ENTEROSCOPY;  Surgeon: Rush Landmark Telford Nab., MD;  Location: [place];  Service: Gastroenterology;  Laterality: N/A;   ESOPHAGOGASTRODUODENOSCOPY (EGD) WITH PROPOFOL N/A 05/24/2020   Procedure: ESOPHAGOGASTRODUODENOSCOPY (EGD) WITH PROPOFOL;  Surgeon: Ladene Artist, MD;  Location: [facility];  Service: Endoscopy;  Laterality: N/A;   EYE SURGERY Bilateral    cataract   FEMORAL-TIBIAL  BYPASS GRAFT Left 10/27/2013   Procedure: BYPASS GRAFT LEFT FEMORAL- POSTERIOR TIBIAL ARTERY;  Surgeon: Angelia Mould, MD;  Location: [place];  Service: Vascular;  Laterality: Left;   FEMORAL-TIBIAL BYPASS GRAFT Right 09/10/2017   Procedure: RIGHT SUPERFICIAL  FEMORAL ARTERY-BELOW KNEE POPLITEAL ARTERY BYPASS GRAFT WITH VEIN;  Surgeon: Angelia Mould, MD;  Location: [place];  Service: Vascular;  Laterality: Right;   GIVENS CAPSULE STUDY N/A 07/20/2020   Procedure: GIVENS CAPSULE STUDY;  Surgeon: Irving Copas., MD;  Location: [facility];  Service: Gastroenterology;  Laterality: N/A;  capsule placed through scope into duodenum @ 0936   I & D EXTREMITY Left 10/27/2013   Procedure: IRRIGATION AND DEBRIDEMENT EXTREMITY- LEFT FOOT;  Surgeon: Angelia Mould, MD;  Location: [place];  Service: Vascular;  Laterality: Left;   I & D EXTREMITY Right 01/26/2020   Procedure: IRRIGATION AND DEBRIDEMENT EXTREMITY RIGHT FOOT  WOUND;  Surgeon: Angelia Mould, MD;  Location: [place];  Service: Vascular;  Laterality: Right;   INSERT / REPLACE / REMOVE PACEMAKER     INTRAOPERATIVE ARTERIOGRAM Right 09/10/2017   Procedure: INTRA OPERATIVE ARTERIOGRAM;  Surgeon: Angelia Mould, MD;  Location: [place];  Service: Vascular;  Laterality: Right;   IR ANGIOGRAM FOLLOW UP STUDY  09/09/2017   IR FLUORO GUIDE CV LINE RIGHT  05/31/2020   IR US GUIDE VASC ACCESS RIGHT  06/01/2020   LEFT HEART CATH AND CORONARY ANGIOGRAPHY N/A 08/19/2020   Procedure: LEFT HEART CATH AND CORONARY ANGIOGRAPHY;  Surgeon: Adrian Prows, MD;  Location: [facility];  Service: Cardiovascular;  Laterality: N/A;   LEFT HEART CATHETERIZATION WITH CORONARY ANGIOGRAM N/A 11/09/2014   Procedure: LEFT HEART CATHETERIZATION WITH CORONARY ANGIOGRAM;  Surgeon: Laverda Page, MD;  Location: [facility] CATH LAB;  Service: Cardiovascular;  Laterality: N/A;   LOWER EXTREMITY ANGIOGRAM Right 11/08/2015   Procedure: Lower  Extremity Angiogram;  Surgeon: Serafina Mitchell, MD;  Location: [place] [facility];  Service: Cardiovascular;  Laterality: Right;   LUMBAR LAMINECTOMY  06/13/2012   Procedure: MICRODISCECTOMY LUMBAR LAMINECTOMY;  Surgeon: Jessy Oto, MD;  Location: [place];  Service: Orthopedics;  Laterality: N/A;  Central laminectomy L2-3, L3-4, L4-5   PACEMAKER INSERTION  10/08/2014   MDT Advisa MRI compatible dual chamber pacemaker implanted by Dr Caryl Comes for syncope with post-termination pauses   PERCUTANEOUS CORONARY STENT INTERVENTION (PCI-S)  11/09/2014   des to lad & distal circumflex         Dr  [name]   PERIPHERAL VASCULAR BALLOON ANGIOPLASTY Right 11/11/2019   Procedure: PERIPHERAL VASCULAR BALLOON ANGIOPLASTY;  Surgeon: Marty Heck, MD;  Location: [place] [facility];  Service: Cardiovascular;  Laterality: Right;  below knee popliteal, tibioperoneal trunk, posterior tibial arteries   PERIPHERAL VASCULAR CATHETERIZATION N/A 11/08/2015   Procedure: Abdominal Aortogram;  Surgeon: Serafina Mitchell, MD;  Location: [place] [facility];  Service: Cardiovascular;  Laterality: N/A;   PERIPHERAL VASCULAR CATHETERIZATION Right 11/08/2015   Procedure: Peripheral Vascular Atherectomy;  Surgeon: Serafina Mitchell, MD;  Location: [facility];  Service: Cardiovascular;  Laterality: Right;   PERIPHERAL VASCULAR CATHETERIZATION N/A 10/10/2016   Procedure: Lower Extremity Angiography;  Surgeon: Waynetta Sandy, MD;  Location: [place] [facility];  Service: Cardiovascular;  Laterality: N/A;   PERIPHERAL VASCULAR CATHETERIZATION Right 10/10/2016   Procedure: Peripheral Vascular Atherectomy;  Surgeon: Waynetta Sandy, MD;  Location: [facility];  Service: Cardiovascular;  Laterality: Right;  Popliteal   PERMANENT PACEMAKER INSERTION N/A 10/08/2014   Procedure: PERMANENT PACEMAKER INSERTION;  Surgeon: Deboraha Sprang, MD;  Location: [facility];  Service: Cardiovascular;  Laterality: N/A;    SUBMUCOSAL TATTOO INJECTION  07/20/2020   Procedure: SUBMUCOSAL TATTOO INJECTION;  Surgeon: Irving Copas., MD;  Location: [place];  Service: Gastroenterology;;   TEMPORARY PACEMAKER INSERTION Bilateral 10/07/2014   Procedure: TEMPORARY PACEMAKER INSERTION;  Surgeon: Laverda Page, MD;  Location: [facility] CATH LAB;  Service: Cardiovascular;  Laterality: Bilateral;   WOUND DEBRIDEMENT Right 02/19/2020   Procedure: DEBRIDEMENT WOUND RIGHT FOOT;  Surgeon: Angelia Mould, MD;  Location: [facility] OR;  Service: Vascular;  Laterality: Right;    Short Social History:  Social History   Tobacco Use   Smoking status: Former    Packs/day: 1.50    Years: 30.00    Pack years: 45.00    Types: Cigarettes    Quit  date: 10/07/2014    Years since quitting: 6.9   Smokeless tobacco: Never  Substance Use Topics   Alcohol use: Yes    Alcohol/week: 21.0 standard drinks    Types: 21 Glasses of wine per week    Allergies  Allergen Reactions   Cytoxan [Cyclophosphamide]     pancytopenia   Keflex [Cephalexin] Nausea And Vomiting   Pletal [Cilostazol] Palpitations    "Can hear heart beating loudly".    Current Outpatient Medications  Medication Sig Dispense Refill   ACCU-CHEK FASTCLIX LANCETS MISC Check blood sugar TID & QHS 102 each 2   acetaminophen (TYLENOL) 500 MG tablet Take 1,000 mg by mouth every 6 (six) hours as needed for moderate pain or headache.     amoxicillin-clavulanate (AUGMENTIN) 875-125 MG tablet Take 1 tablet by mouth 2 (two) times daily for 7 days. 14 tablet 0   apixaban (ELIQUIS) 5 MG TABS tablet Take 1 tablet (5 mg total) by mouth 2 (two) times daily. (Patient taking differently: Take 5 mg by mouth daily.) 180 tablet 0   atorvastatin (LIPITOR) 20 MG tablet Take 1 tablet by mouth once daily 90 tablet 0   azithromycin (ZITHROMAX) 250 MG tablet Take 1 tablet (250 mg total) by mouth daily for 6 days. Take first 2 tablets together, then 1 every day until finished. 6 tablet  0   blood glucose meter kit and supplies KIT Check blood sugar TID & QHS 1 each 0   Blood Glucose Monitoring Suppl (ACCU-CHEK ADVANTAGE DIABETES) kit Use as instructed 1 each 0   furosemide (LASIX) 80 MG tablet Take 80 mg by mouth See admin instructions. Take 1 tablet (80 mg) by mouth on (non-dialysis days) Mondays, Wednesdays, Fridays & Sundays in the morning.     glucose blood (ACCU-CHEK AVIVA PLUS) test strip Use as instructed 100 each 12   glucose blood (CHOICE DM FORA G20 TEST STRIPS) test strip Check blood sugar TID & QHS 100 each 12   glucose blood test strip Use as instructed 100 each 12   insulin NPH-regular Human (70-30) 100 UNIT/ML injection Inject 25 Units into the skin 2 (two) times daily with a meal. (Patient taking differently: Inject 50-80 Units into the skin 2 (two) times daily as needed (blood sugar over 200).) 10 mL 11   metoprolol tartrate (LOPRESSOR) 25 MG tablet Take 1 tablet (25 mg total) by mouth 2 (two) times daily. 180 tablet 3   Multiple Vitamin (MULTIVITAMIN WITH MINERALS) TABS tablet Take 1 tablet by mouth daily.     oxyCODONE-acetaminophen (PERCOCET) 5-325 MG tablet Take 1 tablet by mouth every 6 (six) hours as needed. (Patient taking differently: Take 1 tablet by mouth every 6 (six) hours as needed for moderate pain.) 20 tablet 0   No current facility-administered medications for this visit.    Review of Systems  Constitutional:  Constitutional negative. HENT: HENT negative.  Eyes: Eyes negative.  Respiratory: Respiratory negative.  Cardiovascular: Cardiovascular negative.  GI: Gastrointestinal negative.  Musculoskeletal: Positive for back pain and leg pain.  Skin: Skin negative.  Neurological: Neurological negative. Hematologic: Hematologic/lymphatic negative.  Psychiatric: Psychiatric negative.       Objective:  Objective   Vitals:   09/20/21 0906  BP: (!) 145/65  Pulse: 100  Resp: 20  Temp: 98.1 F (36.7 C)  SpO2: 96%  Weight: 260 lb (117.9  kg)  Height: 6' (1.829 m)   Body mass index is 35.26 kg/m.  Physical Exam HENT:     Head: Normocephalic.  Nose:     Comments: Wearing a mask Eyes:     Pupils: Pupils are equal, round, and reactive to light.  Cardiovascular:     Rate and Rhythm: Normal rate.     Comments: Strong posterior tibial and peroneal signals on the left Pulmonary:     Effort: Pulmonary effort is normal.  Abdominal:     General: Abdomen is flat.     Palpations: Abdomen is soft.  Musculoskeletal:        General: Normal range of motion.     Cervical back: Neck supple.  Skin:    General: Skin is warm.     Capillary Refill: Capillary refill takes less than 2 seconds.  Neurological:     General: No focal deficit present.     Mental Status: He is alert.  Psychiatric:        Mood and Affect: Mood normal.        Thought Content: Thought content normal.        Judgment: Judgment normal.    Data: +----------+--------+-----+--------+---------+--------+  LEFT      PSV cm/sRatioStenosisWaveform Comments  +----------+--------+-----+--------+---------+--------+  CFA Distal216                  triphasic          +----------+--------+-----+--------+---------+--------+  PTA Distal67                   biphasic           +----------+--------+-----+--------+---------+--------+      Left Graft #1: Femoral to tibio peroneal trunk  +--------------------+--------+--------+---------+--------+                      PSV cm/sStenosisWaveform Comments  +--------------------+--------+--------+---------+--------+  Inflow              90              biphasic           +--------------------+--------+--------+---------+--------+  Proximal Anastomosis53              biphasic           +--------------------+--------+--------+---------+--------+  Proximal Graft      83              triphasic          +--------------------+--------+--------+---------+--------+  Mid Graft            65              biphasic           +--------------------+--------+--------+---------+--------+  Distal Graft        62              biphasic           +--------------------+--------+--------+---------+--------+  Distal Anastomosis  69              biphasic           +--------------------+--------+--------+---------+--------+  Outflow             114             biphasic           +--------------------+--------+--------+---------+--------+     Summary:  Left: Patent left fem to tib / per trunk bypass graft with no evidence of  stenosis.      Assessment/Plan:     60 year old male presents with left lower extremity pain.  This may be related to his back as his bypass remains patent and  his TMA which is well-healed appears well perfused.  He is okay to start using his fistula primarily for access and the catheter can be removed when it has been used.  He is going to follow-up in 1 year with repeat left lower extremity duplex and ABIs.     Waynetta Sandy MD [facility]

## 2021-09-21 DIAGNOSIS — Z992 Dependence on renal dialysis: Secondary | ICD-10-CM | POA: Diagnosis not present

## 2021-09-21 DIAGNOSIS — N2581 Secondary hyperparathyroidism of renal origin: Secondary | ICD-10-CM | POA: Diagnosis not present

## 2021-09-21 DIAGNOSIS — T8249XA Other complication of vascular dialysis catheter, initial encounter: Secondary | ICD-10-CM | POA: Diagnosis not present

## 2021-09-21 DIAGNOSIS — D689 Coagulation defect, unspecified: Secondary | ICD-10-CM | POA: Diagnosis not present

## 2021-09-21 DIAGNOSIS — E0821 Diabetes mellitus due to underlying condition with diabetic nephropathy: Secondary | ICD-10-CM | POA: Diagnosis not present

## 2021-09-21 DIAGNOSIS — N186 End stage renal disease: Secondary | ICD-10-CM | POA: Diagnosis not present

## 2021-09-23 DIAGNOSIS — Z992 Dependence on renal dialysis: Secondary | ICD-10-CM | POA: Diagnosis not present

## 2021-09-23 DIAGNOSIS — E0821 Diabetes mellitus due to underlying condition with diabetic nephropathy: Secondary | ICD-10-CM | POA: Diagnosis not present

## 2021-09-23 DIAGNOSIS — D689 Coagulation defect, unspecified: Secondary | ICD-10-CM | POA: Diagnosis not present

## 2021-09-23 DIAGNOSIS — T8249XA Other complication of vascular dialysis catheter, initial encounter: Secondary | ICD-10-CM | POA: Diagnosis not present

## 2021-09-23 DIAGNOSIS — N2581 Secondary hyperparathyroidism of renal origin: Secondary | ICD-10-CM | POA: Diagnosis not present

## 2021-09-23 DIAGNOSIS — N186 End stage renal disease: Secondary | ICD-10-CM | POA: Diagnosis not present

## 2021-09-26 DIAGNOSIS — D689 Coagulation defect, unspecified: Secondary | ICD-10-CM | POA: Diagnosis not present

## 2021-09-26 DIAGNOSIS — N2581 Secondary hyperparathyroidism of renal origin: Secondary | ICD-10-CM | POA: Diagnosis not present

## 2021-09-26 DIAGNOSIS — D509 Iron deficiency anemia, unspecified: Secondary | ICD-10-CM | POA: Diagnosis not present

## 2021-09-26 DIAGNOSIS — D631 Anemia in chronic kidney disease: Secondary | ICD-10-CM | POA: Diagnosis not present

## 2021-09-26 DIAGNOSIS — Z992 Dependence on renal dialysis: Secondary | ICD-10-CM | POA: Diagnosis not present

## 2021-09-26 DIAGNOSIS — T8249XA Other complication of vascular dialysis catheter, initial encounter: Secondary | ICD-10-CM | POA: Diagnosis not present

## 2021-09-26 DIAGNOSIS — E0821 Diabetes mellitus due to underlying condition with diabetic nephropathy: Secondary | ICD-10-CM | POA: Diagnosis not present

## 2021-09-26 DIAGNOSIS — N186 End stage renal disease: Secondary | ICD-10-CM | POA: Diagnosis not present

## 2021-09-28 DIAGNOSIS — E0821 Diabetes mellitus due to underlying condition with diabetic nephropathy: Secondary | ICD-10-CM | POA: Diagnosis not present

## 2021-09-28 DIAGNOSIS — T8249XA Other complication of vascular dialysis catheter, initial encounter: Secondary | ICD-10-CM | POA: Diagnosis not present

## 2021-09-28 DIAGNOSIS — D509 Iron deficiency anemia, unspecified: Secondary | ICD-10-CM | POA: Diagnosis not present

## 2021-09-28 DIAGNOSIS — Z992 Dependence on renal dialysis: Secondary | ICD-10-CM | POA: Diagnosis not present

## 2021-09-28 DIAGNOSIS — D689 Coagulation defect, unspecified: Secondary | ICD-10-CM | POA: Diagnosis not present

## 2021-09-28 DIAGNOSIS — N186 End stage renal disease: Secondary | ICD-10-CM | POA: Diagnosis not present

## 2021-09-28 DIAGNOSIS — D631 Anemia in chronic kidney disease: Secondary | ICD-10-CM | POA: Diagnosis not present

## 2021-09-28 DIAGNOSIS — N2581 Secondary hyperparathyroidism of renal origin: Secondary | ICD-10-CM | POA: Diagnosis not present

## 2021-09-30 ENCOUNTER — Encounter (HOSPITAL_COMMUNITY): Payer: Self-pay | Admitting: Emergency Medicine

## 2021-09-30 ENCOUNTER — Emergency Department (HOSPITAL_COMMUNITY): Payer: Medicare Other

## 2021-09-30 ENCOUNTER — Other Ambulatory Visit: Payer: Self-pay

## 2021-09-30 ENCOUNTER — Observation Stay (HOSPITAL_COMMUNITY)
Admission: EM | Admit: 2021-09-30 | Discharge: 2021-10-01 | Disposition: A | Discharge status: Home / Self Care | Payer: Medicare Other | Attending: Internal Medicine | Admitting: Internal Medicine

## 2021-09-30 DIAGNOSIS — Z992 Dependence on renal dialysis: Secondary | ICD-10-CM | POA: Diagnosis not present

## 2021-09-30 DIAGNOSIS — D631 Anemia in chronic kidney disease: Secondary | ICD-10-CM | POA: Diagnosis not present

## 2021-09-30 DIAGNOSIS — E1122 Type 2 diabetes mellitus with diabetic chronic kidney disease: Secondary | ICD-10-CM | POA: Diagnosis present

## 2021-09-30 DIAGNOSIS — N186 End stage renal disease: Principal | ICD-10-CM | POA: Diagnosis present

## 2021-09-30 DIAGNOSIS — D689 Coagulation defect, unspecified: Secondary | ICD-10-CM | POA: Diagnosis not present

## 2021-09-30 DIAGNOSIS — T8249XA Other complication of vascular dialysis catheter, initial encounter: Secondary | ICD-10-CM | POA: Diagnosis not present

## 2021-09-30 DIAGNOSIS — I2510 Atherosclerotic heart disease of native coronary artery without angina pectoris: Secondary | ICD-10-CM | POA: Diagnosis present

## 2021-09-30 DIAGNOSIS — I480 Paroxysmal atrial fibrillation: Secondary | ICD-10-CM | POA: Diagnosis not present

## 2021-09-30 DIAGNOSIS — Z794 Long term (current) use of insulin: Secondary | ICD-10-CM | POA: Diagnosis not present

## 2021-09-30 DIAGNOSIS — Z950 Presence of cardiac pacemaker: Secondary | ICD-10-CM | POA: Diagnosis not present

## 2021-09-30 DIAGNOSIS — Z87891 Personal history of nicotine dependence: Secondary | ICD-10-CM | POA: Insufficient documentation

## 2021-09-30 DIAGNOSIS — I120 Hypertensive chronic kidney disease with stage 5 chronic kidney disease or end stage renal disease: Secondary | ICD-10-CM | POA: Insufficient documentation

## 2021-09-30 DIAGNOSIS — N2581 Secondary hyperparathyroidism of renal origin: Secondary | ICD-10-CM | POA: Diagnosis not present

## 2021-09-30 DIAGNOSIS — Z79899 Other long term (current) drug therapy: Secondary | ICD-10-CM | POA: Insufficient documentation

## 2021-09-30 DIAGNOSIS — D649 Anemia, unspecified: Secondary | ICD-10-CM | POA: Diagnosis not present

## 2021-09-30 DIAGNOSIS — D509 Iron deficiency anemia, unspecified: Secondary | ICD-10-CM | POA: Diagnosis not present

## 2021-09-30 DIAGNOSIS — E0821 Diabetes mellitus due to underlying condition with diabetic nephropathy: Secondary | ICD-10-CM | POA: Diagnosis not present

## 2021-09-30 DIAGNOSIS — I517 Cardiomegaly: Secondary | ICD-10-CM | POA: Diagnosis not present

## 2021-09-30 DIAGNOSIS — I10 Essential (primary) hypertension: Secondary | ICD-10-CM

## 2021-09-30 DIAGNOSIS — Z7901 Long term (current) use of anticoagulants: Secondary | ICD-10-CM | POA: Diagnosis not present

## 2021-09-30 DIAGNOSIS — I739 Peripheral vascular disease, unspecified: Secondary | ICD-10-CM | POA: Diagnosis present

## 2021-09-30 LAB — CBC WITH DIFFERENTIAL/PLATELET
Abs Immature Granulocytes: 0.03 10*3/uL (ref 0.00–0.07)
Basophils Absolute: 0 10*3/uL (ref 0.0–0.1)
Basophils Relative: 1 %
Eosinophils Absolute: 0 10*3/uL (ref 0.0–0.5)
Eosinophils Relative: 1 %
HCT: 23.7 % — ABNORMAL LOW (ref 39.0–52.0)
Hemoglobin: 7 g/dL — ABNORMAL LOW (ref 13.0–17.0)
Immature Granulocytes: 1 %
Lymphocytes Relative: 20 %
Lymphs Abs: 1.2 10*3/uL (ref 0.7–4.0)
MCH: 28 pg (ref 26.0–34.0)
MCHC: 29.5 g/dL — ABNORMAL LOW (ref 30.0–36.0)
MCV: 94.8 fL (ref 80.0–100.0)
Monocytes Absolute: 0.5 10*3/uL (ref 0.1–1.0)
Monocytes Relative: 8 %
Neutro Abs: 4.3 10*3/uL (ref 1.7–7.7)
Neutrophils Relative %: 69 %
Platelets: 161 10*3/uL (ref 150–400)
RBC: 2.5 MIL/uL — ABNORMAL LOW (ref 4.22–5.81)
RDW: 18.3 % — ABNORMAL HIGH (ref 11.5–15.5)
WBC: 6.2 10*3/uL (ref 4.0–10.5)
nRBC: 0 % (ref 0.0–0.2)

## 2021-09-30 LAB — BASIC METABOLIC PANEL
Anion gap: 8 (ref 5–15)
BUN: 6 mg/dL — ABNORMAL LOW (ref 8–23)
CO2: 31 mmol/L (ref 22–32)
Calcium: 8.5 mg/dL — ABNORMAL LOW (ref 8.9–10.3)
Chloride: 99 mmol/L (ref 98–111)
Creatinine, Ser: 1.8 mg/dL — ABNORMAL HIGH (ref 0.61–1.24)
GFR, Estimated: 42 mL/min — ABNORMAL LOW (ref >60)
Glucose, Bld: 130 mg/dL — ABNORMAL HIGH (ref 70–99)
Potassium: 3.6 mmol/L (ref 3.5–5.1)
Sodium: 138 mmol/L (ref 135–145)

## 2021-09-30 LAB — GLUCOSE, CAPILLARY
Glucose-Capillary: 202 mg/dL — ABNORMAL HIGH (ref 70–99)
Glucose-Capillary: 280 mg/dL — ABNORMAL HIGH (ref 70–99)

## 2021-09-30 LAB — PREPARE RBC (CROSSMATCH)

## 2021-09-30 LAB — POC OCCULT BLOOD, ED: Fecal Occult Bld: NEGATIVE

## 2021-09-30 LAB — HEPATITIS B SURFACE ANTIBODY,QUALITATIVE: Hep B S Ab: REACTIVE — AB

## 2021-09-30 LAB — HEPATITIS B SURFACE ANTIGEN: Hepatitis B Surface Ag: NONREACTIVE

## 2021-09-30 LAB — HIV ANTIBODY (ROUTINE TESTING W REFLEX): HIV Screen 4th Generation wRfx: NONREACTIVE

## 2021-09-30 MED ORDER — HYDROXYZINE HCL 25 MG PO TABS: 25.0000 mg | ORAL_TABLET | Freq: Three times a day (TID) | ORAL | Status: DC | PRN

## 2021-09-30 MED ORDER — SODIUM CHLORIDE 0.9 % IV SOLN: 100.0000 mL | INTRAVENOUS | Status: DC | PRN

## 2021-09-30 MED ORDER — LIDOCAINE HCL (PF) 1 % IJ SOLN: 5.0000 mL | INTRAMUSCULAR | Status: DC | PRN

## 2021-09-30 MED ORDER — SODIUM CHLORIDE 0.9% FLUSH: 3.0000 mL | Freq: Two times a day (BID) | INTRAVENOUS | Status: DC

## 2021-09-30 MED ORDER — SODIUM CHLORIDE 0.9 % IV SOLN: 10.0000 mL/h | Freq: Once | INTRAVENOUS | Status: DC

## 2021-09-30 MED ORDER — CALCIUM CARBONATE ANTACID 1250 MG/5ML PO SUSP
500.0000 mg | Freq: Four times a day (QID) | ORAL | Status: DC | PRN
  Filled 2021-09-30: qty 5

## 2021-09-30 MED ORDER — ONDANSETRON HCL 4 MG/2ML IJ SOLN: 4.0000 mg | Freq: Four times a day (QID) | INTRAMUSCULAR | Status: DC | PRN

## 2021-09-30 MED ORDER — ALTEPLASE 2 MG IJ SOLR: 2.0000 mg | Freq: Once | INTRAMUSCULAR | Status: DC | PRN

## 2021-09-30 MED ORDER — ONDANSETRON HCL 4 MG PO TABS: 4.0000 mg | ORAL_TABLET | Freq: Four times a day (QID) | ORAL | Status: DC | PRN

## 2021-09-30 MED ORDER — ATORVASTATIN CALCIUM 10 MG PO TABS
20.0000 mg | ORAL_TABLET | Freq: Every day | ORAL | Status: DC
  Administered 2021-09-30: 20 mg via ORAL
  Filled 2021-09-30: qty 2

## 2021-09-30 MED ORDER — CAMPHOR-MENTHOL 0.5-0.5 % EX LOTN
1.0000 "application " | TOPICAL_LOTION | Freq: Three times a day (TID) | CUTANEOUS | Status: DC | PRN
  Filled 2021-09-30: qty 222

## 2021-09-30 MED ORDER — ZOLPIDEM TARTRATE 5 MG PO TABS: 5.0000 mg | ORAL_TABLET | Freq: Every evening | ORAL | Status: DC | PRN

## 2021-09-30 MED ORDER — SORBITOL 70 % SOLN: 30.0000 mL | Status: DC | PRN

## 2021-09-30 MED ORDER — LIDOCAINE-PRILOCAINE 2.5-2.5 % EX CREA: 1.0000 "application " | TOPICAL_CREAM | CUTANEOUS | Status: DC | PRN

## 2021-09-30 MED ORDER — HEPARIN SODIUM (PORCINE) 1000 UNIT/ML DIALYSIS
1000.0000 [IU] | INTRAMUSCULAR | Status: DC | PRN
  Filled 2021-09-30: qty 1

## 2021-09-30 MED ORDER — CHLORHEXIDINE GLUCONATE CLOTH 2 % EX PADS: 6.0000 | MEDICATED_PAD | Freq: Every day | CUTANEOUS | Status: DC

## 2021-09-30 MED ORDER — ACETAMINOPHEN 325 MG PO TABS: 650.0000 mg | ORAL_TABLET | Freq: Four times a day (QID) | ORAL | Status: DC | PRN

## 2021-09-30 MED ORDER — PENTAFLUOROPROP-TETRAFLUOROETH EX AERO: 1.0000 "application " | INHALATION_SPRAY | CUTANEOUS | Status: DC | PRN

## 2021-09-30 MED ORDER — OXYCODONE-ACETAMINOPHEN 5-325 MG PO TABS: 1.0000 | ORAL_TABLET | Freq: Four times a day (QID) | ORAL | Status: DC | PRN

## 2021-09-30 MED ORDER — APIXABAN 5 MG PO TABS
5.0000 mg | ORAL_TABLET | Freq: Two times a day (BID) | ORAL | Status: DC
  Administered 2021-10-01: 5 mg via ORAL
  Filled 2021-09-30 (×2): qty 1

## 2021-09-30 MED ORDER — DOCUSATE SODIUM 283 MG RE ENEM
1.0000 | ENEMA | RECTAL | Status: DC | PRN
  Filled 2021-09-30: qty 1

## 2021-09-30 MED ORDER — ACETAMINOPHEN 650 MG RE SUPP: 650.0000 mg | Freq: Four times a day (QID) | RECTAL | Status: DC | PRN

## 2021-09-30 MED ORDER — METOPROLOL TARTRATE 25 MG PO TABS
25.0000 mg | ORAL_TABLET | Freq: Two times a day (BID) | ORAL | Status: DC
  Administered 2021-09-30 – 2021-10-01 (×2): 25 mg via ORAL
  Filled 2021-09-30 (×2): qty 1

## 2021-09-30 MED ORDER — HYDRALAZINE HCL 20 MG/ML IJ SOLN: 5.0000 mg | INTRAMUSCULAR | Status: DC | PRN

## 2021-09-30 MED ORDER — INSULIN ASPART 100 UNIT/ML IJ SOLN
0.0000 [IU] | Freq: Three times a day (TID) | INTRAMUSCULAR | Status: DC
  Administered 2021-10-01: 1 [IU] via SUBCUTANEOUS

## 2021-09-30 MED ORDER — INSULIN ASPART PROT & ASPART (70-30 MIX) 100 UNIT/ML ~~LOC~~ SUSP
60.0000 [IU] | Freq: Two times a day (BID) | SUBCUTANEOUS | Status: DC
  Administered 2021-10-01: 60 [IU] via SUBCUTANEOUS
  Filled 2021-09-30: qty 10

## 2021-09-30 MED ORDER — NEPRO/CARBSTEADY PO LIQD: 237.0000 mL | Freq: Three times a day (TID) | ORAL | Status: DC | PRN

## 2021-09-30 NOTE — Assessment & Plan Note (Signed)
-  s/p R BKA

## 2021-09-30 NOTE — Assessment & Plan Note (Signed)
-  Rate controlled with Lopressor - but did not take this AM and rate is suboptimal now so will give now dose and again at bedtime -Also has a pacemaker -Continue Eliquis, but he is only taking 5 mg daily - this is suboptimal and will not adequately control for stroke risk so recommend increasing to BID as instructed

## 2021-09-30 NOTE — ED Provider Notes (Addendum)
[facility] EMERGENCY DEPARTMENT Provider Note   CSN: [id number] Arrival date & time: 09/30/21  1009     History Chief Complaint  Patient presents with   abnormal labs    Alexander [name] is a 61 y.o. male.  Patient is a dialysis patient.  Normally dialyzed Tuesday Thursday Saturdays.  Was at dialysis today.  Had full treatment.  Was told to come to the emergency department for blood transfusion since hemoglobin was 6.9.  Patient has been reporting fatigue.  Has some chronic back pain.  But no specific complaints.  Patient's had difficulty with this in the past has had a history of pancytopenia has also had history of anemia which I felt was multifactorial.  Patient is on Eliquis for atrial fibrillation.  Has history of alcohol abuse.  Hypertension diabetes.  Patient denies any blood in his bowel movements.  Nothing black no red.  Said he was checked at dialysis just last week and it was negative.      Past Medical History:  Diagnosis Date   Alcohol abuse    Anemia    Arthritis    "patient does not think so."   Atrial fibrillation ([place])    Cardiac syncope 10/07/2014   rapid A fib with 8 sec pauses on converison with syncope- temp pacing wire placed then PPM   Cataract    BILATERAL   Coronary artery disease    Diabetes mellitus    dx---been  awhile   Dysrhythmia    Encounter for care of pacemaker 10/08/2014   ESRD (end stage renal disease) ([place])    [place]   GERD (gastroesophageal reflux disease)    History of blood transfusion    Hyperlipemia    Hypertension    Osteomyelitis ([place])    right foot   Peripheral vascular disease ([place])    Presence of permanent cardiac pacemaker 10/08/2014   [employer]   Sinus node dysfunction ([place]) 10/08/2014    Patient Active Problem List   Diagnosis Date Noted   NSVT (nonsustained ventricular tachycardia)    Pancytopenia ([place])    Acute blood loss anemia 07/18/2020   Hemodialysis patient ([place])  06/04/2020   Necrotizing glomerulonephritis 06/04/2020   Heme positive stool    Melena    Upper GI bleeding 05/23/2020   Acute GI bleeding 05/23/2020   Acute hematogenous osteomyelitis of right foot ([place]) 03/11/2020   Gangrene of right foot ([place])    Diabetic foot ulcer associated with type 2 diabetes mellitus ([place]) 02/19/2020   Heel ulcer ([place]) 11/05/2019   Atherosclerotic PVD with ulceration ([place]) 09/09/2017   Essential hypertension, benign 02/07/2015   Renal insufficiency 02/07/2015   Post PTCA 11/09/2014   S/P PTCA (percutaneous transluminal coronary angioplasty) 11/09/2014   Pacemaker: [address] DR MRI [id number] Dual chamber pacemaker 10/08/2014 10/08/2014   Encounter for care of pacemaker 10/08/2014   Sinus node dysfunction ([place]) 10/08/2014   Cardiac asystole ([place]) 10/07/2014   Cardiac syncope 10/07/2014   Diabetes mellitus with stage 3 chronic kidney disease ([place]) 10/07/2014   CAD (coronary artery disease), native coronary artery 10/07/2014   Diabetes mellitus due to underlying condition without complications ([place]) 32/44/0102   Anemia, unspecified 05/25/2014   Syncope 04/30/2014   Atherosclerosis of native arteries of the extremities with gangrene ([place]) 12/16/2013   Volume overload 10/30/2013   Acute renal failure ([place]) 10/28/2013   PAD (peripheral artery disease) ([place]) 10/27/2013   Leukocytosis 10/21/2013   Anemia 10/21/2013   Osteomyelitis of  left foot ([place]) 08/19/2013   Spinal stenosis, lumbar region, with neurogenic claudication 06/12/2012    Class: Diagnosis of   DIABETES MELLITUS, TYPE II 03/15/2009   ALCOHOL ABUSE 03/15/2009   TOBACCO USE 03/15/2009   HYPERTENSION 03/15/2009    Past Surgical History:  Procedure Laterality Date   A/V FISTULAGRAM Right 05/01/2021   Procedure: A/V FISTULAGRAM;  Surgeon: Waynetta Sandy, MD;  Location: [facility];  Service: Cardiovascular;  Laterality: Right;  failed fistulagram   ABDOMINAL AORTOGRAM W/LOWER  EXTREMITY N/A 09/09/2017   Procedure: ABDOMINAL AORTOGRAM W/LOWER EXTREMITY;  Surgeon: Angelia Mould, MD;  Location: [facility];  Service: Cardiovascular;  Laterality: N/A;   ABDOMINAL AORTOGRAM W/LOWER EXTREMITY Right 11/11/2019   Procedure: ABDOMINAL AORTOGRAM W/LOWER EXTREMITY;  Surgeon: Marty Heck, MD;  Location: [facility];  Service: Cardiovascular;  Laterality: Right;   AMPUTATION Left 08/19/2013   Procedure: AMPUTATION RAY;  Surgeon: Alta Corning, MD;  Location: [facility];  Service: Orthopedics;  Laterality: Left;  ray amputation left 5th   AMPUTATION Left 10/27/2013   Procedure: AMPUTATION DIGIT-LEFT 4TH TOE, 4th and 5th metatarsal.;  Surgeon: Angelia Mould, MD;  Location: [facility];  Service: Vascular;  Laterality: Left;   AMPUTATION Left 11/04/2013   Procedure: LEFT FOOT TRANS-METATARSAL AMPUTATION WITH WOUND CLOSURE ;  Surgeon: Alta Corning, MD;  Location: [place];  Service: Orthopedics;  Laterality: Left;   AMPUTATION Right 09/10/2017   Procedure: RIGHT FOURTH AND FIFTH TOE AMPUTATION;  Surgeon: Angelia Mould, MD;  Location: [place];  Service: Vascular;  Laterality: Right;   AMPUTATION Right 02/19/2020   Procedure: AMPUTATION RIGHT 3RD TOE;  Surgeon: Angelia Mould, MD;  Location: [place];  Service: Vascular;  Laterality: Right;   AMPUTATION Right 03/11/2020   Procedure: RIGHT BELOW KNEE AMPUTATION;  Surgeon: Newt Minion, MD;  Location: [place];  Service: Orthopedics;  Laterality: Right;   APPLICATION OF WOUND VAC Right 02/19/2020   Procedure: Application Of Wound Vac Right foot;  Surgeon: Angelia Mould, MD;  Location: [place] [facility];  Service: Vascular;  Laterality: Right;   AV FISTULA PLACEMENT Right 03/27/2021   Procedure: RIGHT ARM ARTERIOVENOUS (AV) FISTULA;  Surgeon: Angelia Mould, MD;  Location: [place];  Service: Vascular;  Laterality: Right;   AV FISTULA PLACEMENT Right 05/10/2021   Procedure: RIGHT ARM ARTERIOVENOUS  (AV) FISTULA;  Surgeon: Waynetta Sandy, MD;  Location: [place];  Service: Vascular;  Laterality: Right;   [place] Right 08/11/2021   Procedure: RIGHT ARM SECOND STAGE BASILIC VEIN TRANSPOSITION;  Surgeon: Waynetta Sandy, MD;  Location: [place];  Service: Vascular;  Laterality: Right;   BIOPSY  07/20/2020   Procedure: BIOPSY;  Surgeon: Irving Copas., MD;  Location: [facility];  Service: Gastroenterology;;   CARDIAC CATHETERIZATION  10/07/2014   Procedure: LEFT HEART CATH AND CORONARY ANGIOGRAPHY;  Surgeon: Laverda Page, MD;  Location: [facility] C[facility] LAB;  Service: Cardiovascular;;   COLONOSCOPY W/ POLYPECTOMY     COLONOSCOPY WITH PROPOFOL N/A 05/25/2020   Procedure: COLONOSCOPY WITH PROPOFOL;  Surgeon: Gatha Mayer, MD;  Location: [facility];  Service: Endoscopy;  Laterality: N/A;   EMBOLECTOMY Right 09/12/2017   Procedure: Thrombectomy  & Redo Right Below Knee Popliteal Artery Bypass Graft  ;  Surgeon: Waynetta Sandy, MD;  Location: [facility];  Service: Vascular;  Laterality: Right;   ENTEROSCOPY N/A 07/20/2020   Procedure: ENTEROSCOPY;  Surgeon: Irving Copas., MD;  Location: [facility];  Service: Gastroenterology;  Laterality: N/A;   ESOPHAGOGASTRODUODENOSCOPY (EGD) WITH PROPOFOL N/A 05/24/2020   Procedure: ESOPHAGOGASTRODUODENOSCOPY (EGD) WITH PROPOFOL;  Surgeon: Ladene Artist, MD;  Location: [facility];  Service: Endoscopy;  Laterality: N/A;   EYE SURGERY Bilateral    cataract   FEMORAL-TIBIAL BYPASS GRAFT Left 10/27/2013   Procedure: BYPASS GRAFT LEFT FEMORAL- POSTERIOR TIBIAL ARTERY;  Surgeon: Angelia Mould, MD;  Location: [facility];  Service: Vascular;  Laterality: Left;   FEMORAL-TIBIAL BYPASS GRAFT Right 09/10/2017   Procedure: RIGHT SUPERFICIAL  FEMORAL ARTERY-BELOW KNEE POPLITEAL ARTERY BYPASS GRAFT WITH VEIN;  Surgeon: Angelia Mould, MD;  Location: [facility];  Service: Vascular;   Laterality: Right;   GIVENS CAPSULE STUDY N/A 07/20/2020   Procedure: GIVENS CAPSULE STUDY;  Surgeon: Irving Copas., MD;  Location: [facility];  Service: Gastroenterology;  Laterality: N/A;  capsule placed through scope into duodenum @ 0936   I & D EXTREMITY Left 10/27/2013   Procedure: IRRIGATION AND DEBRIDEMENT EXTREMITY- LEFT FOOT;  Surgeon: Angelia Mould, MD;  Location: [facility];  Service: Vascular;  Laterality: Left;   I & D EXTREMITY Right 01/26/2020   Procedure: IRRIGATION AND DEBRIDEMENT EXTREMITY RIGHT FOOT WOUND;  Surgeon: Angelia Mould, MD;  Location: [facility] [place];  Service: Vascular;  Laterality: Right;   INSERT / REPLACE / REMOVE PACEMAKER     INTRAOPERATIVE ARTERIOGRAM Right 09/10/2017   Procedure: INTRA OPERATIVE ARTERIOGRAM;  Surgeon: Angelia Mould, MD;  Location: [facility];  Service: Vascular;  Laterality: Right;   IR ANGIOGRAM FOLLOW UP STUDY  09/09/2017   IR FLUORO GUIDE CV LINE RIGHT  05/31/2020   IR US GUIDE VASC ACCESS RIGHT  06/01/2020   LEFT HEART CATH AND CORONARY ANGIOGRAPHY N/A 08/19/2020   Procedure: LEFT HEART CATH AND CORONARY ANGIOGRAPHY;  Surgeon: Adrian Prows, MD;  Location: [facility];  Service: Cardiovascular;  Laterality: N/A;   LEFT HEART CATHETERIZATION WITH CORONARY ANGIOGRAM N/A 11/09/2014   Procedure: LEFT HEART CATHETERIZATION WITH CORONARY ANGIOGRAM;  Surgeon: Laverda Page, MD;  Location: [facility] C[facility] LAB;  Service: Cardiovascular;  Laterality: N/A;   LOWER EXTREMITY ANGIOGRAM Right 11/08/2015   Procedure: Lower Extremity Angiogram;  Surgeon: Serafina Mitchell, MD;  Location: [facility];  Service: Cardiovascular;  Laterality: Right;   LUMBAR LAMINECTOMY  06/13/2012   Procedure: MICRODISCECTOMY LUMBAR LAMINECTOMY;  Surgeon: Jessy Oto, MD;  Location: [facility];  Service: Orthopedics;  Laterality: N/A;  Central laminectomy L2-3, L3-4, L4-5   PACEMAKER INSERTION  10/08/2014   MDT [employer] MRI compatible dual chamber  pacemaker implanted by Dr Caryl Comes for syncope with post-termination pauses   PERCUTANEOUS CORONARY STENT INTERVENTION (PCI-S)  11/09/2014   des to lad & distal circumflex         Dr  [name]   PERIPHERAL VASCULAR BALLOON ANGIOPLASTY Right 11/11/2019   Procedure: PERIPHERAL VASCULAR BALLOON ANGIOPLASTY;  Surgeon: Marty Heck, MD;  Location: [facility];  Service: Cardiovascular;  Laterality: Right;  below knee popliteal, tibioperoneal trunk, posterior tibial arteries   PERIPHERAL VASCULAR CATHETERIZATION N/A 11/08/2015   Procedure: Abdominal Aortogram;  Surgeon: Serafina Mitchell, MD;  Location: [facility];  Service: Cardiovascular;  Laterality: N/A;   PERIPHERAL VASCULAR CATHETERIZATION Right 11/08/2015   Procedure: Peripheral Vascular Atherectomy;  Surgeon: Serafina Mitchell, MD;  Location: [place] [facility];  Service: Cardiovascular;  Laterality: Right;   PERIPHERAL VASCULAR CATHETERIZATION N/A 10/10/2016  Procedure: Lower Extremity Angiography;  Surgeon: Waynetta Sandy, MD;  Location: [facility];  Service: Cardiovascular;  Laterality: N/A;   PERIPHERAL VASCULAR CATHETERIZATION Right 10/10/2016   Procedure: Peripheral Vascular Atherectomy;  Surgeon: Waynetta Sandy, MD;  Location: [facility];  Service: Cardiovascular;  Laterality: Right;  Popliteal   PERMANENT PACEMAKER INSERTION N/A 10/08/2014   Procedure: PERMANENT PACEMAKER INSERTION;  Surgeon: Deboraha Sprang, MD;  Location: [facility];  Service: Cardiovascular;  Laterality: N/A;   SUBMUCOSAL TATTOO INJECTION  07/20/2020   Procedure: SUBMUCOSAL TATTOO INJECTION;  Surgeon: Irving Copas., MD;  Location: [facility];  Service: Gastroenterology;;   TEMPORARY PACEMAKER INSERTION Bilateral 10/07/2014   Procedure: TEMPORARY PACEMAKER INSERTION;  Surgeon: Laverda Page, MD;  Location: [facility] CATH LAB;  Service: Cardiovascular;  Laterality: Bilateral;   WOUND DEBRIDEMENT Right 02/19/2020    Procedure: DEBRIDEMENT WOUND RIGHT FOOT;  Surgeon: Angelia Mould, MD;  Location: [facility] OR;  Service: Vascular;  Laterality: Right;       Family History  Problem Relation Age of Onset   Diabetes type II Mother    Hypertension Mother    Diabetes Mother    Liver cancer Father    Diabetes type II Sister    Breast cancer Sister    Diabetes Sister    Hypertension Sister    Diabetes type II Brother    Kidney failure Brother    Diabetes Brother    Hypertension Brother    Diabetes type II Sister     Social History   Tobacco Use   Smoking status: Former    Packs/day: 1.50    Years: 30.00    Pack years: 45.00    Types: Cigarettes    Quit date: 10/07/2014    Years since quitting: 6.9   Smokeless tobacco: Never  Vaping Use   Vaping Use: Never used  Substance Use Topics   Alcohol use: Yes    Alcohol/week: 21.0 standard drinks    Types: 21 Glasses of wine per week   Drug use: No    Home Medications Prior to Admission medications   Medication Sig Start Date End Date Taking? Authorizing Provider  ACCU-CHEK FASTCLIX LANCETS MISC Check blood sugar TID & QHS 10/01/14   Lorayne Marek, MD  acetaminophen (TYLENOL) 500 MG tablet Take 1,000 mg by mouth every 6 (six) hours as needed for moderate pain or headache.    [provider]  apixaban (ELIQUIS) 5 MG TABS tablet Take 1 tablet (5 mg total) by mouth 2 (two) times daily. Patient taking differently: Take 5 mg by mouth daily. 08/24/20 04/26/24  Cantwell, Celeste C, PA-C  atorvastatin (LIPITOR) 20 MG tablet Take 1 tablet by mouth once daily 08/29/21   Cantwell, Celeste C, PA-C  blood glucose meter kit and supplies KIT Check blood sugar TID & QHS 02/10/15   Advani, Vernon Prey, MD  Blood Glucose Monitoring Suppl (ACCU-CHEK ADVANTAGE DIABETES) kit Use as instructed 10/01/14   Lorayne Marek, MD  furosemide (LASIX) 80 MG tablet Take 80 mg by mouth See admin instructions. Take 1 tablet (80 mg) by mouth on (non-dialysis days)  Mondays, Wednesdays, Fridays & Sundays in the morning. 02/26/21   [provider]  glucose blood (ACCU-CHEK AVIVA PLUS) test strip Use as instructed 10/01/14   Advani, Vernon Prey, MD  glucose blood (CHOICE DM FORA G20 TEST STRIPS) test strip Check blood sugar TID & QHS 02/10/15   Advani, Vernon Prey, MD  glucose blood test strip Use as instructed 11/20/13  Reyne Dumas, MD  insulin NPH-regular Human (70-30) 100 UNIT/ML injection Inject 25 Units into the skin 2 (two) times daily with a meal. Patient taking differently: Inject 50-80 Units into the skin 2 (two) times daily as needed (blood sugar over 200). 07/23/20   Regalado, Belkys A, MD  metoprolol tartrate (LOPRESSOR) 25 MG tablet Take 1 tablet (25 mg total) by mouth 2 (two) times daily. 05/24/21 05/19/22  Adrian Prows, MD  Multiple Vitamin (MULTIVITAMIN WITH MINERALS) TABS tablet Take 1 tablet by mouth daily.    [provider]  oxyCODONE-acetaminophen (PERCOCET) 5-325 MG tablet Take 1 tablet by mouth every 6 (six) hours as needed. Patient taking differently: Take 1 tablet by mouth every 6 (six) hours as needed for moderate pain. 08/11/21   Rhyne, Hulen Shouts, PA-C    Allergies    Cytoxan [cyclophosphamide], Keflex [cephalexin], and Pletal [cilostazol]  Review of Systems   Review of Systems  Constitutional:  Positive for fatigue. Negative for chills and fever.  HENT:  Negative for ear pain and sore throat.   Eyes:  Negative for pain and visual disturbance.  Respiratory:  Negative for cough and shortness of breath.   Cardiovascular:  Negative for chest pain and palpitations.  Gastrointestinal:  Negative for abdominal pain, blood in stool, nausea and vomiting.  Genitourinary:  Negative for dysuria and hematuria.  Musculoskeletal:  Negative for arthralgias and back pain.  Skin:  Negative for color change and rash.  Neurological:  Negative for seizures and syncope.  All other systems reviewed and are negative.  Physical Exam Updated Vital  Signs BP 138/69   Pulse (!) 108   Temp 98.3 F (36.8 C)   Resp 13   SpO2 100%   Physical Exam Vitals and nursing note reviewed.  Constitutional:      General: He is not in acute distress.    Appearance: Normal appearance. He is well-developed.  HENT:     Head: Normocephalic and atraumatic.  Eyes:     Extraocular Movements: Extraocular movements intact.     Conjunctiva/sclera: Conjunctivae normal.     Pupils: Pupils are equal, round, and reactive to light.  Cardiovascular:     Rate and Rhythm: Normal rate and regular rhythm.     Heart sounds: No murmur heard. Pulmonary:     Effort: Pulmonary effort is normal. No respiratory distress.     Breath sounds: Normal breath sounds.  Abdominal:     Palpations: Abdomen is soft.     Tenderness: There is no abdominal tenderness.  Genitourinary:    Rectum: Guaiac result negative.     Comments: Stool brown in color not black no gross blood Musculoskeletal:        General: No swelling.     Cervical back: Normal range of motion and neck supple.     Comments: Right arm AV fistula with good thrill.  Also right below the knee amputation.  Skin:    General: Skin is warm and dry.     Capillary Refill: Capillary refill takes less than 2 seconds.  Neurological:     General: No focal deficit present.     Mental Status: He is alert and oriented to person, place, and time.  Psychiatric:        Mood and Affect: Mood normal.    ED Results / Procedures / Treatments   Labs (all labs ordered are listed, but only abnormal results are displayed) Labs Reviewed  BASIC METABOLIC PANEL - Abnormal; Notable for the following components:  Result Value   Glucose, Bld 130 (*)    BUN 6 (*)    Creatinine, Ser 1.80 (*)    Calcium 8.5 (*)    GFR, Estimated 42 (*)    All other components within normal limits  CBC WITH DIFFERENTIAL/PLATELET - Abnormal; Notable for the following components:   RBC 2.50 (*)    Hemoglobin 7.0 (*)    HCT 23.7 (*)    MCHC  29.5 (*)    RDW 18.3 (*)    All other components within normal limits  POC OCCULT BLOOD, ED  TYPE AND SCREEN  PREPARE RBC (CROSSMATCH)    EKG None  Radiology No results found.  Procedures Procedures   Medications Ordered in ED Medications  0.9 %  sodium chloride infusion (has no administration in time range)    ED Course  I have reviewed the triage vital signs and the nursing notes.  Pertinent labs & imaging results that were available during my care of the patient were reviewed by me and considered in my medical decision making (see chart for details).    MDM Rules/Calculators/A&P                         CRITICAL CARE Performed by: Fredia Sorrow Total critical care time: 35 minutes Critical care time was exclusive of separately billable procedures and treating other patients. Critical care was necessary to treat or prevent imminent or life-threatening deterioration. Critical care was time spent personally by me on the following activities: development of treatment plan with patient and/or surrogate as well as nursing, discussions with consultants, evaluation of patient's response to treatment, examination of patient, obtaining history from patient or surrogate, ordering and performing treatments and interventions, ordering and review of laboratory studies, ordering and review of radiographic studies, pulse oximetry and re-evaluation of patient's condition.   Guaiac negative.  Will initiate blood transfusion.   Patient most likely will need least 2 units of blood.  He will need to be slow.  Discussed with nephrology and patient will need hospitalist admission.  Patient's electrolytes here today look like he was just dialyzed this morning as he was.  Potassium is good at 3.6.  Chest x-ray pending.  But oxygen saturations are 92%.  Lungs were clear.  EKG also ordered since he has a history of atrial fibrillation.  White blood cell count is 6.2 hemoglobin 7.0 platelets normal  at 161.  Differential is normal.  Discussed with Dr. Burnett Sheng on-call for nephrology.  They will consult.  Will contact hospitalist for admission.  Transfusion ordered.  Final Clinical Impression(s) / ED Diagnoses Final diagnoses:  Anemia due to chronic kidney disease, on chronic dialysis ([place])  ESRD on hemodialysis [facility])    Rx / DC Orders ED Discharge Orders     None        Fredia Sorrow, MD 09/30/21 1327    Fredia Sorrow, MD 09/30/21 [phone]

## 2021-09-30 NOTE — Plan of Care (Signed)
CHANGE IN DIALYSIS SCHEDULE DUE TO Alexander [name] 1961-10-10 [id number]  Patient dialysis schedule for the week of 10/01/21-10/08/21 varies from their normal schedule due to Thanksgiving Holiday.  Unfortunately due to unforeseen circumstances schedule while admitted to [facility] will be different from outpatient dialysis schedule.  Need to keep in mind for discharge planning.  Both schedules are as follows:  Normal HD schedule: Tuesday Thursday Saturday Inpatient HD schedule at [facility]: Monday, Wednesday, Saturday Outpatient HD schedule: Tuesday, Friday, Sunday  They will resume their normal schedule on 10/09/21.     Jen Mow, PA-C [facility] Pager: [phone]

## 2021-09-30 NOTE — Assessment & Plan Note (Signed)
-  Patient with known h/o renal failure-associated anemia  -He was found to have Hgb 7 today at HD and sent to the ER -He is asymptomatic -Heme testing negative -I attempted to arrange for transfusion at his next HD session on Tuesday but this is apparently no longer possible -As such, will observe overnight, transfuse, dialyze, and likely d/c in AM -Transfuse 2 units PRBC to start and recheck CBC in AM

## 2021-09-30 NOTE — ED Provider Notes (Signed)
Emergency Medicine Provider Triage Evaluation Note  Alexander [name] , a 60 y.o. male  was evaluated in triage.  Pt complains of abnormal lab.  He was seen in dialysis today and was having generalized weakness.  Labs dialysis were significant for hemoglobin of 6.4.  Patient has no complaints other than generalized weakness.  Review of Systems  Positive: generalized weakness Negative: Chest pain, shortness of breath  Physical Exam  There were no vitals taken for this visit. Gen:   Awake, no distress   Resp:  Normal effort  MSK:   Moves extremities without difficulty  Other:    Medical Decision Making  Medically screening exam initiated at 10:53 AM.  Appropriate orders placed.  [name] was informed that the remainder of the evaluation will be completed by another provider, this initial triage assessment does not replace that evaluation, and the importance of remaining in the ED until their evaluation is complete.     Adolphus Birchwood, PA-C 09/30/21 1054    Tegeler, Gwenyth Allegra, MD 09/30/21 1100

## 2021-09-30 NOTE — Assessment & Plan Note (Signed)
-  He takes metoprolol but holds it on HD days -Will give now and again tonight for rate control

## 2021-09-30 NOTE — Plan of Care (Signed)
  Problem: Education: Goal: Knowledge of disease and its progression will improve Outcome: Progressing   Problem: Fluid Volume: Goal: Compliance with measures to maintain balanced fluid volume will improve Outcome: Progressing   Problem: Nutritional: Goal: Ability to make healthy dietary choices will improve Outcome: Progressing   Problem: Clinical Measurements: Goal: Complications related to the disease process, condition or treatment will be avoided or minimized Outcome: Progressing   

## 2021-09-30 NOTE — Assessment & Plan Note (Addendum)
-  s/p stent -Denies CP -Continue Lipitor

## 2021-09-30 NOTE — Assessment & Plan Note (Signed)
-  Patient on chronic TTS HD, completed HD session today -Nephrology prn order set utilized -Given his LE edema, he is at high risk for volume overload if he is transfused without HD. -As such, despite full session today, he will have HD again tonight and be transfused at that time. -He is due for HD on his regular schedule again Tuesday but then goes to Friday for the holiday schedule so this should work well.

## 2021-09-30 NOTE — ED Notes (Signed)
Dialysis and blood consent signed and witnessed.

## 2021-09-30 NOTE — ED Triage Notes (Signed)
Pt states he had full treatment of dialysis this morning.  States he was told to come to ED for a blood transfusion for Hgb 6.9.  Reports fatigue.

## 2021-09-30 NOTE — H&P (Signed)
History and Physical    Alexander [name] HQP:[id number] DOB: 12/01/1960 DOA: 09/30/2021  PCP: [name], MD Consultants:  [facility] - vascular surgery; [facility] - cardiology;  Patient coming from:  Home - lives with wife and 2 adult children; NOK: Wife, [phone]  Chief Complaint: Symptomatic anemia  HPI: Alexander [name] is a 60 y.o. male with medical history significant of remote ETOH dependence; afib; CAD; DM; pacemaker; ESRD on TTS HD; HTN; PVD s/p R BKA; and HLD presenting with symptomatic anemia (sent from HD).  He reports low hemoglobin.  He has not noticed symptoms, not feeling weak or tired.  They checked his blood work after HD and his Hgb was found to be low.  His eyes feel a little tired but he has no other symptoms.  No blood in his stool.     ED Course: HD patient, dialyzed completely.  Hgb 6.9, heme negative.  Likely chronic due to CKD.  On Eliquis for afib.  D/w Dr. Jonnie Finner, will see.  Give 2 units of blood and dc tomorrow.  Review of Systems: As per HPI; otherwise review of systems reviewed and negative.   Ambulatory Status:  Uses a wheelchair, has a prosthesis  COVID Vaccine Status:   Complete  Past Medical History:  Diagnosis Date   Alcohol abuse    Anemia    Arthritis    "patient does not think so."   Atrial fibrillation ([place])    Cardiac syncope 10/07/2014   rapid A fib with 8 sec pauses on converison with syncope- temp pacing wire placed then PPM   Cataract    BILATERAL   Coronary artery disease    Diabetes mellitus    dx---been  awhile   ESRD (end stage renal disease) ([place])    [place]   GERD (gastroesophageal reflux disease)    History of blood transfusion    Hyperlipemia    Hypertension    Osteomyelitis ([place])    right foot   Peripheral vascular disease ([place])    Presence of permanent cardiac pacemaker 10/08/2014   [employer]    Past Surgical History:  Procedure Laterality Date   A/V FISTULAGRAM Right 05/01/2021   Procedure: A/V  FISTULAGRAM;  Surgeon: Waynetta Sandy, MD;  Location: [place] [facility];  Service: Cardiovascular;  Laterality: Right;  failed fistulagram   ABDOMINAL AORTOGRAM W/LOWER EXTREMITY N/A 09/09/2017   Procedure: ABDOMINAL AORTOGRAM W/LOWER EXTREMITY;  Surgeon: Angelia Mould, MD;  Location: [place] [facility];  Service: Cardiovascular;  Laterality: N/A;   ABDOMINAL AORTOGRAM W/LOWER EXTREMITY Right 11/11/2019   Procedure: ABDOMINAL AORTOGRAM W/LOWER EXTREMITY;  Surgeon: Marty Heck, MD;  Location: [place] [facility];  Service: Cardiovascular;  Laterality: Right;   AMPUTATION Left 08/19/2013   Procedure: AMPUTATION RAY;  Surgeon: Alta Corning, MD;  Location: [place];  Service: Orthopedics;  Laterality: Left;  ray amputation left 5th   AMPUTATION Left 10/27/2013   Procedure: AMPUTATION DIGIT-LEFT 4TH TOE, 4th and 5th metatarsal.;  Surgeon: Angelia Mould, MD;  Location: [place];  Service: Vascular;  Laterality: Left;   AMPUTATION Left 11/04/2013   Procedure: LEFT FOOT TRANS-METATARSAL AMPUTATION WITH WOUND CLOSURE ;  Surgeon: Alta Corning, MD;  Location: [place];  Service: Orthopedics;  Laterality: Left;   AMPUTATION Right 09/10/2017   Procedure: RIGHT FOURTH AND FIFTH TOE AMPUTATION;  Surgeon: Angelia Mould, MD;  Location: [place];  Service: Vascular;  Laterality: Right;   AMPUTATION Right 02/19/2020  Procedure: AMPUTATION RIGHT 3RD TOE;  Surgeon: Angelia Mould, MD;  Location: [place];  Service: Vascular;  Laterality: Right;   AMPUTATION Right 03/11/2020   Procedure: RIGHT BELOW KNEE AMPUTATION;  Surgeon: Newt Minion, MD;  Location: [place];  Service: Orthopedics;  Laterality: Right;   APPLICATION OF WOUND VAC Right 02/19/2020   Procedure: Application Of Wound Vac Right foot;  Surgeon: Angelia Mould, MD;  Location: [place];  Service: Vascular;  Laterality: Right;   AV FISTULA PLACEMENT Right 03/27/2021   Procedure: RIGHT ARM ARTERIOVENOUS (AV)  FISTULA;  Surgeon: Angelia Mould, MD;  Location: [place];  Service: Vascular;  Laterality: Right;   AV FISTULA PLACEMENT Right 05/10/2021   Procedure: RIGHT ARM ARTERIOVENOUS (AV) FISTULA;  Surgeon: Waynetta Sandy, MD;  Location: [place];  Service: Vascular;  Laterality: Right;   [place] Right 08/11/2021   Procedure: RIGHT ARM SECOND STAGE BASILIC VEIN TRANSPOSITION;  Surgeon: Waynetta Sandy, MD;  Location: [place];  Service: Vascular;  Laterality: Right;   BIOPSY  07/20/2020   Procedure: BIOPSY;  Surgeon: Irving Copas., MD;  Location: [place];  Service: Gastroenterology;;   CARDIAC CATHETERIZATION  10/07/2014   Procedure: LEFT HEART CATH AND CORONARY ANGIOGRAPHY;  Surgeon: Laverda Page, MD;  Location: [facility] C[facility];  Service: Cardiovascular;;   COLONOSCOPY W/ POLYPECTOMY     COLONOSCOPY WITH PROPOFOL N/A 05/25/2020   Procedure: COLONOSCOPY WITH PROPOFOL;  Surgeon: Gatha Mayer, MD;  Location: [facility];  Service: Endoscopy;  Laterality: N/A;   EMBOLECTOMY Right 09/12/2017   Procedure: Thrombectomy  & Redo Right Below Knee Popliteal Artery Bypass Graft  ;  Surgeon: Waynetta Sandy, MD;  [facility];  Service: Vascular;  Laterality: Right;   ENTEROSCOPY N/A 07/20/2020   Procedure: ENTEROSCOPY;  Surgeon: Rush Landmark Telford Nab., MD;  Location: [place];  Service: Gastroenterology;  Laterality: N/A;   ESOPHAGOGASTRODUODENOSCOPY (EGD) WITH PROPOFOL N/A 05/24/2020   Procedure: ESOPHAGOGASTRODUODENOSCOPY (EGD) WITH PROPOFOL;  Surgeon: Ladene Artist, MD;  Location: [facility];  Service: Endoscopy;  Laterality: N/A;   EYE SURGERY Bilateral    cataract   FEMORAL-TIBIAL BYPASS GRAFT Left 10/27/2013   Procedure: BYPASS GRAFT LEFT FEMORAL- POSTERIOR TIBIAL ARTERY;  Surgeon: Angelia Mould, MD;  Location: [place];  Service: Vascular;  Laterality: Left;   FEMORAL-TIBIAL BYPASS GRAFT Right  09/10/2017   Procedure: RIGHT SUPERFICIAL  FEMORAL ARTERY-BELOW KNEE POPLITEAL ARTERY BYPASS GRAFT WITH VEIN;  Surgeon: Angelia Mould, MD;  Location: [place];  Service: Vascular;  Laterality: Right;   GIVENS CAPSULE STUDY N/A 07/20/2020   Procedure: GIVENS CAPSULE STUDY;  Surgeon: Irving Copas., MD;  Location: [place];  Service: Gastroenterology;  Laterality: N/A;  capsule placed through scope into duodenum @ 0936   I & D EXTREMITY Left 10/27/2013   Procedure: IRRIGATION AND DEBRIDEMENT EXTREMITY- LEFT FOOT;  Surgeon: Angelia Mould, MD;  Location: [place];  Service: Vascular;  Laterality: Left;   I & D EXTREMITY Right 01/26/2020   Procedure: IRRIGATION AND DEBRIDEMENT EXTREMITY RIGHT FOOT WOUND;  Surgeon: Angelia Mould, MD;  Location: [place];  Service: Vascular;  Laterality: Right;   INSERT / REPLACE / REMOVE PACEMAKER     INTRAOPERATIVE ARTERIOGRAM Right 09/10/2017   Procedure: INTRA OPERATIVE ARTERIOGRAM;  Surgeon: Angelia Mould, MD;  Location: [place];  Service: Vascular;  Laterality: Right;   IR ANGIOGRAM FOLLOW UP STUDY  09/09/2017   IR  FLUORO GUIDE CV LINE RIGHT  05/31/2020   IR US GUIDE VASC ACCESS RIGHT  06/01/2020   LEFT HEART CATH AND CORONARY ANGIOGRAPHY N/A 08/19/2020   Procedure: LEFT HEART CATH AND CORONARY ANGIOGRAPHY;  Surgeon: Adrian Prows, MD;  Location: [place] [facility];  Service: Cardiovascular;  Laterality: N/A;   LEFT HEART CATHETERIZATION WITH CORONARY ANGIOGRAM N/A 11/09/2014   Procedure: LEFT HEART CATHETERIZATION WITH CORONARY ANGIOGRAM;  Surgeon: Laverda Page, MD;  Location: [facility] CATH LAB;  Service: Cardiovascular;  Laterality: N/A;   LOWER EXTREMITY ANGIOGRAM Right 11/08/2015   Procedure: Lower Extremity Angiogram;  Surgeon: Serafina Mitchell, MD;  Location: [place] [facility];  Service: Cardiovascular;  Laterality: Right;   LUMBAR LAMINECTOMY  06/13/2012   Procedure: MICRODISCECTOMY LUMBAR LAMINECTOMY;  Surgeon: Jessy Oto, MD;  Location: [place];  Service: Orthopedics;  Laterality: N/A;  Central laminectomy L2-3, L3-4, L4-5   PACEMAKER INSERTION  10/08/2014   MDT Advisa MRI compatible dual chamber pacemaker implanted by Dr Caryl Comes for syncope with post-termination pauses   PERCUTANEOUS CORONARY STENT INTERVENTION (PCI-S)  11/09/2014   des to lad & distal circumflex         Dr  [name]   PERIPHERAL VASCULAR BALLOON ANGIOPLASTY Right 11/11/2019   Procedure: PERIPHERAL VASCULAR BALLOON ANGIOPLASTY;  Surgeon: Marty Heck, MD;  Location: [place] [facility];  Service: Cardiovascular;  Laterality: Right;  below knee popliteal, tibioperoneal trunk, posterior tibial arteries   PERIPHERAL VASCULAR CATHETERIZATION N/A 11/08/2015   Procedure: Abdominal Aortogram;  Surgeon: Serafina Mitchell, MD;  Location: [place] [facility];  Service: Cardiovascular;  Laterality: N/A;   PERIPHERAL VASCULAR CATHETERIZATION Right 11/08/2015   Procedure: Peripheral Vascular Atherectomy;  Surgeon: Serafina Mitchell, MD;  Location: [place] [facility];  Service: Cardiovascular;  Laterality: Right;   PERIPHERAL VASCULAR CATHETERIZATION N/A 10/10/2016   Procedure: Lower Extremity Angiography;  Surgeon: Waynetta Sandy, MD;  Location: [place] [facility];  Service: Cardiovascular;  Laterality: N/A;   PERIPHERAL VASCULAR CATHETERIZATION Right 10/10/2016   Procedure: Peripheral Vascular Atherectomy;  Surgeon: Waynetta Sandy, MD;  Location: [place] [facility];  Service: Cardiovascular;  Laterality: Right;  Popliteal   PERMANENT PACEMAKER INSERTION N/A 10/08/2014   Procedure: PERMANENT PACEMAKER INSERTION;  Surgeon: Deboraha Sprang, MD;  Location: [facility] CATH LAB;  Service: Cardiovascular;  Laterality: N/A;   SUBMUCOSAL TATTOO INJECTION  07/20/2020   Procedure: SUBMUCOSAL TATTOO INJECTION;  Surgeon: Irving Copas., MD;  Location: [place];  Service: Gastroenterology;;   TEMPORARY PACEMAKER INSERTION Bilateral 10/07/2014    Procedure: TEMPORARY PACEMAKER INSERTION;  Surgeon: Laverda Page, MD;  Location: [facility] CATH LAB;  Service: Cardiovascular;  Laterality: Bilateral;   WOUND DEBRIDEMENT Right 02/19/2020   Procedure: DEBRIDEMENT WOUND RIGHT FOOT;  Surgeon: Angelia Mould, MD;  Location: [facility] OR;  Service: Vascular;  Laterality: Right;    Social History   Socioeconomic History   Marital status: Married    Spouse name: Not on file   Number of children: 2   Years of education: Not on file   Highest education level: Not on file  Occupational History   Occupation: Disabled  Tobacco Use   Smoking status: Former    Packs/day: 1.50    Years: 30.00    Pack years: 45.00    Types: Cigarettes    Quit date: 10/07/2014    Years since quitting: 6.9   Smokeless tobacco: Never  Vaping Use   Vaping Use: Never used  Substance and  Sexual Activity   Alcohol use: Yes    Alcohol/week: 21.0 standard drinks    Types: 21 Glasses of wine per week    Comment: cut way back, none in the last month   Drug use: No   Sexual activity: Not on file  Other Topics Concern   Not on file  Social History Narrative   Not on file   Social Determinants of Health   Financial Resource Strain: Not on file  Food Insecurity: Not on file  Transportation Needs: Not on file  Physical Activity: Not on file  Stress: Not on file  Social Connections: Not on file  Intimate Partner Violence: Not on file    Allergies  Allergen Reactions   Cytoxan [Cyclophosphamide]     pancytopenia   Keflex [Cephalexin] Nausea And Vomiting   Pletal [Cilostazol] Palpitations    "Can hear heart beating loudly".    Family History  Problem Relation Age of Onset   Diabetes type II Mother    Hypertension Mother    Diabetes Mother    Liver cancer Father    Diabetes type II Sister    Breast cancer Sister    Diabetes Sister    Hypertension Sister    Diabetes type II Brother    Kidney failure Brother    Diabetes Brother    Hypertension  Brother    Diabetes type II Sister     Prior to Admission medications   Medication Sig Start Date End Date Taking? Authorizing Provider  acetaminophen (TYLENOL) 500 MG tablet Take 1,000 mg by mouth every 6 (six) hours as needed for moderate pain or headache.   Yes [provider]  apixaban (ELIQUIS) 5 MG TABS tablet Take 1 tablet (5 mg total) by mouth 2 (two) times daily. Patient taking differently: Take 5 mg by mouth daily. 08/24/20 04/26/24 Yes Cantwell, Celeste C, PA-C  atorvastatin (LIPITOR) 20 MG tablet Take 1 tablet by mouth once daily Patient taking differently: Take 20 mg by mouth at bedtime. 08/29/21  Yes Cantwell, Celeste C, PA-C  furosemide (LASIX) 80 MG tablet Take 80 mg by mouth See admin instructions. Take 1 tablet (80 mg) by mouth on (non-dialysis days) Mondays, Wednesdays, Fridays & Sundays in the morning. 02/26/21  Yes [provider]  insulin NPH-regular Human (70-30) 100 UNIT/ML injection Inject 25 Units into the skin 2 (two) times daily with a meal. Patient taking differently: Inject 60 Units into the skin 2 (two) times daily with a meal. 07/23/20  Yes Regalado, Belkys A, MD  metoprolol tartrate (LOPRESSOR) 25 MG tablet Take 1 tablet (25 mg total) by mouth 2 (two) times daily. 05/24/21 05/19/22 Yes Adrian Prows, MD  Multiple Vitamin (MULTIVITAMIN WITH MINERALS) TABS tablet Take 1 tablet by mouth daily.   Yes [provider]  oxyCODONE-acetaminophen (PERCOCET) 5-325 MG tablet Take 1 tablet by mouth every 6 (six) hours as needed. Patient taking differently: Take 1 tablet by mouth every 6 (six) hours as needed for moderate pain. 08/11/21  Yes Rhyne, Hulen Shouts, PA-C  ACCU-CHEK FASTCLIX LANCETS MISC Check blood sugar TID & QHS 10/01/14   Advani, Vernon Prey, MD  blood glucose meter kit and supplies KIT Check blood sugar TID & QHS 02/10/15   Advani, Vernon Prey, MD  Blood Glucose Monitoring Suppl (ACCU-CHEK ADVANTAGE DIABETES) kit Use as instructed 10/01/14   Lorayne Marek,  MD  glucose blood (ACCU-CHEK AVIVA PLUS) test strip Use as instructed 10/01/14   Advani, Vernon Prey, MD  glucose blood (CHOICE DM FORA G20 TEST  STRIPS) test strip Check blood sugar TID & QHS 02/10/15   Advani, Vernon Prey, MD  glucose blood test strip Use as instructed 11/20/13   Reyne Dumas, MD    Physical Exam: Vitals:   09/30/21 1230 09/30/21 1300 09/30/21 1330 09/30/21 1415  BP: 132/68 138/69 126/69 137/81  Pulse: 90 (!) 108 (!) 119 (!) 113  Resp: '13 13 14 17  ' Temp:      SpO2: 100% 100% 93% 96%     General:  Appears calm and comfortable and is in NAD Eyes:  PERRL, EOMI, normal lids, iris ENT:  grossly normal hearing, lips & tongue, mmm; poor dentition Neck:  no LAD, masses or thyromegaly Cardiovascular:  Irregularly irregular with mild tachycardia, no m/r/g. 2-3+ LE edema.  Respiratory:   CTA bilaterally with no wheezes/rales/rhonchi.  Normal respiratory effort. Abdomen:  soft, NT, ND Skin:  no rash or induration seen on limited exam Musculoskeletal:  grossly normal tone BUE/BLE, good ROM, no bony abnormality, s/p R BKA with prosthesis in place Psychiatric:  grossly normal mood and affect, speech fluent and appropriate, AOx3 Neurologic:  CN 2-12 grossly intact, moves all extremities in coordinated fashion    Radiological Exams on Admission: Independently reviewed - see discussion in A/P where applicable  DG Chest Port 1 View  Result Date: 09/30/2021 CLINICAL DATA:  End-stage renal disease.  Pain. EXAM: PORTABLE CHEST 1 VIEW COMPARISON:  September 16, 2021 chest x-ray and November 5th 2022 CT scan of the chest. FINDINGS: The opacity in the left apex remains. Stable cardiomegaly and pacemaker. Stable dialysis catheter. No other interval changes or acute abnormalities. IMPRESSION: 1. Persistent opacity in the left upper lobe favored 2 represent pneumonia on the recent chest x-ray and CT scan. Recommend follow-up CT imaging after completing treatment, in approximately 3 months, to confirm  resolution. Electronically Signed   By: Dorise Bullion III M.D.   On: 09/30/2021 14:49    EKG: Independently reviewed.  Afib with rate 121; RBBB and LAFB with NSCSLT   Labs on Admission: I have personally reviewed the available labs and imaging studies at the time of the admission.  Pertinent labs:   Glucose 130 BUN 6/Creatinine 1.80/GFR 42 WBC 6.2 Hgb 7.0; 7.8 on 11/5 Heme negative   Assessment/Plan Principal Problem:   Symptomatic anemia Active Problems:   ESRD (end stage renal disease) (HCC)   Diabetes mellitus with end stage renal disease (HCC)   PVD (peripheral vascular disease) (HCC)   CAD (coronary artery disease), native coronary artery   Essential hypertension, benign   * Symptomatic anemia -Patient with known h/o renal failure-associated anemia  -He was found to have Hgb 7 today at HD and sent to the ER -He is asymptomatic -Heme testing negative -I attempted to arrange for transfusion at his next HD session on Tuesday but this is apparently no longer possible -As such, will observe overnight, transfuse, dialyze, and likely d/c in AM -Transfuse 2 units PRBC to start and recheck CBC in AM  ESRD (end stage renal disease) ([place]) -Patient on chronic TTS HD, completed HD session today -Nephrology prn order set utilized -Given his LE edema, he is at high risk for volume overload if he is transfused without HD. -As such, despite full session today, he will have HD again tonight and be transfused at that time. -He is due for HD on his regular schedule again Tuesday but then goes to Friday for the holiday schedule so this should work well.  PAF (paroxysmal atrial fibrillation) (HCC) -Rate  controlled with Lopressor - but did not take this AM and rate is suboptimal now so will give now dose and again at bedtime -Also has a pacemaker -Continue Eliquis, but he is only taking 5 mg daily - this is suboptimal and will not adequately control for stroke risk so recommend  increasing to BID as instructed  Essential hypertension, benign -He takes metoprolol but holds it on HD days -Will give now and again tonight for rate control  CAD (coronary artery disease), native coronary artery -s/p stent -Denies CP -Continue Lipitor  PVD (peripheral vascular disease) ([place]) -s/p R BKA  Diabetes mellitus with end stage renal disease ([place]) -Last A1c was 5.5, indicating good control -Continue 70/30 -Cover with very sensitive-scale SSI      Note: This patient has been tested and is negative for the novel coronavirus COVID-19. The patient has been fully vaccinated against COVID-19.   Level of care: Telemetry Medical DVT prophylaxis: Eliquis Code Status:  Full - confirmed with patient Family Communication: None present Disposition Plan:  The patient is from: home  Anticipated d/c is to: home without [facility] services   Anticipated d/c date will depend on clinical response to treatment, but possibly as early as tomorrow if he has excellent response to treatment  Patient is currently: acutely ill Consults called: Nephrology  Admission status:  It is my clinical opinion that referral for OBSERVATION is reasonable and necessary in this patient based on the above information provided. The aforementioned taken together are felt to place the patient at high risk for further clinical deterioration. However it is anticipated that the patient may be medically stable for discharge from the hospital within 24 to 48 hours.    Karmen Bongo MD [facility]   How to contact the [facility] Attending or Consulting provider [place] or covering provider during after hours [place], for this patient?  Check the care team in [facility] and look for a) attending/consulting [facility] provider listed and b) the [facility] team listed Log into [email or web address] and use [place]'s universal password to access. If you do not have the password, please contact the hospital operator. Locate the [facility] provider you are  looking for under [facility] and page to a number that you can be directly reached. If you still have difficulty reaching the provider, please page the [facility] (Director on Call) for the Hospitalists listed on amion for assistance.   09/30/2021, 5:12 PM

## 2021-09-30 NOTE — Progress Notes (Addendum)
[facility] BRIEF PROGRESS NOTE  Alexander [name] [id number] 1961/08/06  60 year old male with ESRD on HD TTS who is being admitted to observation due to anemia requiring blood transfusion.  Reports he was told by his dialysis center his Thursday was 6.9 and to go to the ED post HD if he was symptomatic.  Reports completing 3 stool cards for HD provider, which were all negative.  Denies SOB, CP, weakness, dizziness fatigue, bleeding, hemoptysis, hematochezia, melena, hematemesis and hematuria.  States he underwent GI workup including capsule study in the past with no source of bleeding identified.  Outpatient dialysis records show patient has been on max ESA since September with ongoing downward trend in labs as follows: 9.8 (9/8) > 9.1 (9/22) > 8.4 (10/6) > 9.0 (10/13) >8.6 (10/20) > 8.0 (10/27) > 7.8 (11/3) > 7.5 (11/10) > 6.9 (11/17).  Patient referred this week to Heme/Onc (Dr. Burr Medico) for work up.   Pertinent findings include +A fib, O2 sat 100% on RA, lungs CTAB, 2+ edema on LLE, CXR with persistent opacity in LUL thought to be PNA, and Hgb 7.0.    Outpatient HD orders:  [facility] TTS 4hrs 49min 400/800 2K 2.5Ca TDC  Mircera 237mcg q2wks last 11/17 Venofer 50mg  qwk Calcitriol 0.40mcg qHD No Heparin  Plan: Blood transfusion.  Completed dialysis this AM, leaving 0.5kg over his dry weight.  Edema noted on exam and we are concerned patient will become volume overloaded with administration of pRBC so will plan for extra HD early morning with transfusion.  Patient can resume regular schedule after discharge.  If admitted to inpatient status will complete full consult.  Due to Thanksgiving holiday patient will run on alternate schedule this week.  Inpatient and outpatient holiday schedules differ due to unforeseen circumstances and are as follows:  Normal HD schedule: Tuesday Thursday Saturday Inpatient HD schedule at [facility]: Monday, Wednesday, Saturday Outpatient HD schedule: Tuesday,  Friday, Sunday  They will resume their normal schedule on 10/09/21.    Jen Mow, PA-C [facility] Pager: [phone]

## 2021-09-30 NOTE — Assessment & Plan Note (Signed)
-  Last A1c was 5.5, indicating good control -Continue 70/30 -Cover with very sensitive-scale SSI

## 2021-09-30 NOTE — ED Notes (Signed)
ED TO INPATIENT HANDOFF REPORT  ED Nurse Name and Phone #: Baxter Flattery, RN [phone]  S Name/Age/Gender Alexander [name] 60 y.o. male Room/Bed: 040C/040C  Code Status   Code Status: Prior  Home/SNF/Other Home Patient oriented to: self, place, time, and situation Is this baseline? Yes   Triage Complete: Triage complete  Chief Complaint Symptomatic anemia [D64.9]  Triage Note Pt states he had full treatment of dialysis this morning.  States he was told to come to ED for a blood transfusion for Hgb 6.9.  Reports fatigue.    Allergies Allergies  Allergen Reactions   Cytoxan [Cyclophosphamide]     pancytopenia   Keflex [Cephalexin] Nausea And Vomiting   Pletal [Cilostazol] Palpitations    "Can hear heart beating loudly".    Level of Care/Admitting Diagnosis ED Disposition     ED Disposition  Admit   Condition  --   [place]: [place] [[id number]]  Level of Care: Telemetry Medical [104]  May place patient in observation at [facility] or [place] if equivalent level of care is available:: No  Covid Evaluation: Asymptomatic Screening Protocol (No Symptoms)  Diagnosis: Symptomatic anemia [2800349]  Admitting Physician: Karmen Bongo [[id number]]  Attending Physician: Karmen Bongo [[id number]]          B Medical/Surgery History Past Medical History:  Diagnosis Date   Alcohol abuse    Anemia    Arthritis    "patient does not think so."   Atrial fibrillation ([place])    Cardiac syncope 10/07/2014   rapid A fib with 8 sec pauses on converison with syncope- temp pacing wire placed then PPM   Cataract    BILATERAL   Coronary artery disease    Diabetes mellitus    dx---been  awhile   ESRD (end stage renal disease) ([place])    [place]   GERD (gastroesophageal reflux disease)    History of blood transfusion    Hyperlipemia    Hypertension    Osteomyelitis ([place])    right foot   Peripheral vascular disease ([place])    Presence of  permanent cardiac pacemaker 10/08/2014   [employer]   Past Surgical History:  Procedure Laterality Date   A/V FISTULAGRAM Right 05/01/2021   Procedure: A/V FISTULAGRAM;  Surgeon: Waynetta Sandy, MD;  Location: [place] [facility];  Service: Cardiovascular;  Laterality: Right;  failed fistulagram   ABDOMINAL AORTOGRAM W/LOWER EXTREMITY N/A 09/09/2017   Procedure: ABDOMINAL AORTOGRAM W/LOWER EXTREMITY;  Surgeon: Angelia Mould, MD;  Location: [place] [facility];  Service: Cardiovascular;  Laterality: N/A;   ABDOMINAL AORTOGRAM W/LOWER EXTREMITY Right 11/11/2019   Procedure: ABDOMINAL AORTOGRAM W/LOWER EXTREMITY;  Surgeon: Marty Heck, MD;  Location: [place] [facility];  Service: Cardiovascular;  Laterality: Right;   AMPUTATION Left 08/19/2013   Procedure: AMPUTATION RAY;  Surgeon: Alta Corning, MD;  Location: [place];  Service: Orthopedics;  Laterality: Left;  ray amputation left 5th   AMPUTATION Left 10/27/2013   Procedure: AMPUTATION DIGIT-LEFT 4TH TOE, 4th and 5th metatarsal.;  Surgeon: Angelia Mould, MD;  Location: [place];  Service: Vascular;  Laterality: Left;   AMPUTATION Left 11/04/2013   Procedure: LEFT FOOT TRANS-METATARSAL AMPUTATION WITH WOUND CLOSURE ;  Surgeon: Alta Corning, MD;  Location: [place];  Service: Orthopedics;  Laterality: Left;   AMPUTATION Right 09/10/2017   Procedure: RIGHT FOURTH AND FIFTH TOE AMPUTATION;  Surgeon: Angelia Mould, MD;  Location: [place];  Service:  Vascular;  Laterality: Right;   AMPUTATION Right 02/19/2020   Procedure: AMPUTATION RIGHT 3RD TOE;  Surgeon: Angelia Mould, MD;  Location: [place];  Service: Vascular;  Laterality: Right;   AMPUTATION Right 03/11/2020   Procedure: RIGHT BELOW KNEE AMPUTATION;  Surgeon: Newt Minion, MD;  Location: [place];  Service: Orthopedics;  Laterality: Right;   APPLICATION OF WOUND VAC Right 02/19/2020   Procedure: Application Of Wound Vac Right foot;  Surgeon: Angelia Mould, MD;  Location: [place];  Service: Vascular;  Laterality: Right;   AV FISTULA PLACEMENT Right 03/27/2021   Procedure: RIGHT ARM ARTERIOVENOUS (AV) FISTULA;  Surgeon: Angelia Mould, MD;  Location: [place];  Service: Vascular;  Laterality: Right;   AV FISTULA PLACEMENT Right 05/10/2021   Procedure: RIGHT ARM ARTERIOVENOUS (AV) FISTULA;  Surgeon: Waynetta Sandy, MD;  Location: [place];  Service: Vascular;  Laterality: Right;   [place] Right 08/11/2021   Procedure: RIGHT ARM SECOND STAGE BASILIC VEIN TRANSPOSITION;  Surgeon: Waynetta Sandy, MD;  Location: [place];  Service: Vascular;  Laterality: Right;   BIOPSY  07/20/2020   Procedure: BIOPSY;  Surgeon: Irving Copas., MD;  Location: [place];  Service: Gastroenterology;;   CARDIAC CATHETERIZATION  10/07/2014   Procedure: LEFT HEART CATH AND CORONARY ANGIOGRAPHY;  Surgeon: Laverda Page, MD;  Location[facility] C[facility] LAB;  Service: Cardiovascular;;   COLONOSCOPY W/ POLYPECTOMY     COLONOSCOPY WITH PROPOFOL N/A 05/25/2020   Procedure: COLONOSCOPY WITH PROPOFOL;  Surgeon: Gatha Mayer, MD;  Location: [facility];  Service: Endoscopy;  Laterality: N/A;   EMBOLECTOMY Right 09/12/2017   Procedure: Thrombectomy  & Redo Right Below Knee Popliteal Artery Bypass Graft  ;  Surgeon: Waynetta Sandy, MD;  Location: [facility] OR;  Service: Vascular;  Laterality: Right;   ENTEROSCOPY N/A 07/20/2020   Procedure: ENTEROSCOPY;  Surgeon: Rush Landmark Telford Nab., MD;  Location: [place];  Service: Gastroenterology;  Laterality: N/A;   ESOPHAGOGASTRODUODENOSCOPY (EGD) WITH PROPOFOL N/A 05/24/2020   Procedure: ESOPHAGOGASTRODUODENOSCOPY (EGD) WITH PROPOFOL;  Surgeon: Ladene Artist, MD;  Location: [facility];  Service: Endoscopy;  Laterality: N/A;   EYE SURGERY Bilateral    cataract   FEMORAL-TIBIAL BYPASS GRAFT Left 10/27/2013   Procedure: BYPASS GRAFT LEFT FEMORAL-  POSTERIOR TIBIAL ARTERY;  Surgeon: Angelia Mould, MD;  Location: [place];  Service: Vascular;  Laterality: Left;   FEMORAL-TIBIAL BYPASS GRAFT Right 09/10/2017   Procedure: RIGHT SUPERFICIAL  FEMORAL ARTERY-BELOW KNEE POPLITEAL ARTERY BYPASS GRAFT WITH VEIN;  Surgeon: Angelia Mould, MD;  Location: [place];  Service: Vascular;  Laterality: Right;   GIVENS CAPSULE STUDY N/A 07/20/2020   Procedure: GIVENS CAPSULE STUDY;  Surgeon: Irving Copas., MD;  Location: [place];  Service: Gastroenterology;  Laterality: N/A;  capsule placed through scope into duodenum @ 0936   I & D EXTREMITY Left 10/27/2013   Procedure: IRRIGATION AND DEBRIDEMENT EXTREMITY- LEFT FOOT;  Surgeon: Angelia Mould, MD;  Location: [place];  Service: Vascular;  Laterality: Left;   I & D EXTREMITY Right 01/26/2020   Procedure: IRRIGATION AND DEBRIDEMENT EXTREMITY RIGHT FOOT WOUND;  Surgeon: Angelia Mould, MD;  Location: [place];  Service: Vascular;  Laterality: Right;   INSERT / REPLACE / REMOVE PACEMAKER     INTRAOPERATIVE ARTERIOGRAM Right 09/10/2017   Procedure: INTRA OPERATIVE ARTERIOGRAM;  Surgeon: Angelia Mould, MD;  Location: [place];  Service: Vascular;  Laterality: Right;  IR ANGIOGRAM FOLLOW UP STUDY  09/09/2017   IR FLUORO GUIDE CV LINE RIGHT  05/31/2020   IR US GUIDE VASC ACCESS RIGHT  06/01/2020   LEFT HEART CATH AND CORONARY ANGIOGRAPHY N/A 08/19/2020   Procedure: LEFT HEART CATH AND CORONARY ANGIOGRAPHY;  Surgeon: Adrian Prows, MD;  Location: [place] [facility];  Service: Cardiovascular;  Laterality: N/A;   LEFT HEART CATHETERIZATION WITH CORONARY ANGIOGRAM N/A 11/09/2014   Procedure: LEFT HEART CATHETERIZATION WITH CORONARY ANGIOGRAM;  Surgeon: Laverda Page, MD;  Location: [facility] CATH LAB;  Service: Cardiovascular;  Laterality: N/A;   LOWER EXTREMITY ANGIOGRAM Right 11/08/2015   Procedure: Lower Extremity Angiogram;  Surgeon: Serafina Mitchell, MD;  Location: [place] [facility];  Service: Cardiovascular;  Laterality: Right;   LUMBAR LAMINECTOMY  06/13/2012   Procedure: MICRODISCECTOMY LUMBAR LAMINECTOMY;  Surgeon: Jessy Oto, MD;  Location: [place];  Service: Orthopedics;  Laterality: N/A;  Central laminectomy L2-3, L3-4, L4-5   PACEMAKER INSERTION  10/08/2014   MDT Advisa MRI compatible dual chamber pacemaker implanted by Dr Caryl Comes for syncope with post-termination pauses   PERCUTANEOUS CORONARY STENT INTERVENTION (PCI-S)  11/09/2014   des to lad & distal circumflex         Dr  [name]   PERIPHERAL VASCULAR BALLOON ANGIOPLASTY Right 11/11/2019   Procedure: PERIPHERAL VASCULAR BALLOON ANGIOPLASTY;  Surgeon: Marty Heck, MD;  Location: [place] [facility];  Service: Cardiovascular;  Laterality: Right;  below knee popliteal, tibioperoneal trunk, posterior tibial arteries   PERIPHERAL VASCULAR CATHETERIZATION N/A 11/08/2015   Procedure: Abdominal Aortogram;  Surgeon: Serafina Mitchell, MD;  Location: [place] [facility];  Service: Cardiovascular;  Laterality: N/A;   PERIPHERAL VASCULAR CATHETERIZATION Right 11/08/2015   Procedure: Peripheral Vascular Atherectomy;  Surgeon: Serafina Mitchell, MD;  Location: [place] [facility];  Service: Cardiovascular;  Laterality: Right;   PERIPHERAL VASCULAR CATHETERIZATION N/A 10/10/2016   Procedure: Lower Extremity Angiography;  Surgeon: Waynetta Sandy, MD;  Location: [place] [facility];  Service: Cardiovascular;  Laterality: N/A;   PERIPHERAL VASCULAR CATHETERIZATION Right 10/10/2016   Procedure: Peripheral Vascular Atherectomy;  Surgeon: Waynetta Sandy, MD;  Location: [place] [facility];  Service: Cardiovascular;  Laterality: Right;  Popliteal   PERMANENT PACEMAKER INSERTION N/A 10/08/2014   Procedure: PERMANENT PACEMAKER INSERTION;  Surgeon: Deboraha Sprang, MD;  Location: [facility] CATH LAB;  Service: Cardiovascular;  Laterality: N/A;   SUBMUCOSAL TATTOO INJECTION  07/20/2020   Procedure: SUBMUCOSAL TATTOO  INJECTION;  Surgeon: Irving Copas., MD;  Location: [place];  Service: Gastroenterology;;   TEMPORARY PACEMAKER INSERTION Bilateral 10/07/2014   Procedure: TEMPORARY PACEMAKER INSERTION;  Surgeon: Laverda Page, MD;  Location: [facility] CATH LAB;  Service: Cardiovascular;  Laterality: Bilateral;   WOUND DEBRIDEMENT Right 02/19/2020   Procedure: DEBRIDEMENT WOUND RIGHT FOOT;  Surgeon: Angelia Mould, MD;  Location: [facility] OR;  Service: Vascular;  Laterality: Right;     A IV Location/Drains/Wounds Patient Lines/Drains/Airways Status     Active Line/Drains/Airways     Name Placement date Placement time Site Days   Peripheral IV 09/16/21 20 G Anterior;Left;Proximal Forearm 09/16/21  1027  Forearm  14   Fistula / Graft Right Upper arm Arteriovenous fistula 03/27/21  0849  Upper arm  187   Hemodialysis Catheter Right Subclavian Double lumen Permanent (Tunneled) 08/11/21  1002  Subclavian  50   Incision (Closed) 03/27/21 Arm Right 03/27/21  0858  -- 187   Incision (Closed) 05/10/21 Arm Right 05/10/21  1120  -- 143   Incision (Closed) 08/11/21 Arm Right 08/11/21  0845  -- 50            Intake/Output Last 24 hours No intake or output data in the 24 hours ending 09/30/21 1717  Labs/Imaging Results for orders placed or performed during the hospital encounter of 09/30/21 (from the past 48 hour(s))  Basic metabolic panel     Status: Abnormal   Collection Time: 09/30/21 10:53 AM  Result Value Ref Range   Sodium 138 135 - 145 mmol/L   Potassium 3.6 3.5 - 5.1 mmol/L   Chloride 99 98 - 111 mmol/L   CO2 31 22 - 32 mmol/L   Glucose, Bld 130 (H) 70 - 99 mg/dL    Comment: Glucose reference range applies only to samples taken after fasting for at least 8 hours.   BUN 6 (L) 8 - 23 mg/dL   Creatinine, Ser 1.80 (H) 0.61 - 1.24 mg/dL   Calcium 8.5 (L) 8.9 - 10.3 mg/dL   GFR, Estimated 42 (L) >60 mL/min    Comment: (NOTE) Calculated using the CKD-EPI Creatinine Equation (2021)     Anion gap 8 5 - 15    Comment: Performed at [facility] [address], [place], [place] [address]  CBC with Differential     Status: Abnormal   Collection Time: 09/30/21 10:53 AM  Result Value Ref Range   WBC 6.2 4.0 - 10.5 K/uL   RBC 2.50 (L) 4.22 - 5.81 MIL/uL   Hemoglobin 7.0 (L) 13.0 - 17.0 g/dL   HCT 23.7 (L) 39.0 - 52.0 %   MCV 94.8 80.0 - 100.0 fL   MCH 28.0 26.0 - 34.0 pg   MCHC 29.5 (L) 30.0 - 36.0 g/dL   RDW 18.3 (H) 11.5 - 15.5 %   Platelets 161 150 - 400 K/uL   nRBC 0.0 0.0 - 0.2 %   Neutrophils Relative % 69 %   Neutro Abs 4.3 1.7 - 7.7 K/uL   Lymphocytes Relative 20 %   Lymphs Abs 1.2 0.7 - 4.0 K/uL   Monocytes Relative 8 %   Monocytes Absolute 0.5 0.1 - 1.0 K/uL   Eosinophils Relative 1 %   Eosinophils Absolute 0.0 0.0 - 0.5 K/uL   Basophils Relative 1 %   Basophils Absolute 0.0 0.0 - 0.1 K/uL   Immature Granulocytes 1 %   Abs Immature Granulocytes 0.03 0.00 - 0.07 K/uL    Comment: Performed at [facility] [address], [place], [place] [address]  Type and screen [place]     Status: None (Preliminary result)   Collection Time: 09/30/21 10:56 AM  Result Value Ref Range   ABO/RH(D) A POS    Antibody Screen NEG    Sample Expiration      10/03/2021,2359 Performed at [facility], [place] [address], [place], [place] [address]    Unit Number [id number]    Blood Component Type RBC LR PHER1    Unit division 00    Status of Unit ALLOCATED    Transfusion Status OK TO TRANSFUSE    Crossmatch Result Compatible    Unit Number [id number]    Blood Component Type RBC LR PHER1    Unit division 00    Status of Unit ALLOCATED    Transfusion Status OK TO TRANSFUSE    Crossmatch Result Compatible   Prepare RBC (crossmatch)     Status: None   Collection Time: 09/30/21  1:20 PM  Result Value Ref Range   Order Confirmation      ORDER PROCESSED BY BLOOD BANK Performed at [facility], [place] [address], [place],  [place] [address]   POC occult blood, ED Provider will collect     Status: None   Collection Time: 09/30/21  1:22 PM  Result Value Ref Range   Fecal Occult Bld NEGATIVE NEGATIVE   DG Chest Port 1 View  Result Date: 09/30/2021 CLINICAL DATA:  End-stage renal disease.  Pain. EXAM: PORTABLE CHEST 1 VIEW COMPARISON:  September 16, 2021 chest x-ray and November 5th 2022 CT scan of the chest. FINDINGS: The opacity in the left apex remains. Stable cardiomegaly and pacemaker. Stable dialysis catheter. No other interval changes or acute abnormalities. IMPRESSION: 1. Persistent opacity in the left upper lobe favored 2 represent pneumonia on the recent chest x-ray and CT scan. Recommend follow-up CT imaging after completing treatment, in approximately 3 months, to confirm resolution. Electronically Signed   By: Dorise Bullion III M.D.   On: 09/30/2021 14:49    Pending Labs Unresulted Labs (From admission, onward)     Start     Ordered   09/30/21 1653  Hepatitis B surface antigen  (New Admission Hemo Labs (Hepatitis B))  Once,   R        09/30/21 1652   09/30/21 1653  Hepatitis B surface antibody  (New Admission Hem[facility] Labs (Hepatitis B))  Once,   R        09/30/21 1652   09/30/21 1653  Hepatitis B surface antibody,quantitative  (New Admission [facility] Labs (Hepatitis B))  Once,   R        09/30/21 1652   Signed and Held  HIV Antibody (routine testing w rflx)  (HIV Antibody (Routine testing w reflex) panel)  Once,   R        Signed and Held   Signed and Held  CBC  Tomorrow morning,   R        Signed and Held   Signed and Held  Basic metabolic panel  Tomorrow morning,   R        Signed and Held   Pending  Hepatitis B surface antibody  (New Admission Hemo Labs (Hepatitis B))  ONCE - STAT,   R        Pending   Pending  Hepatitis B surface antibody,quantitative  (New Admission [facility] Labs (Hepatitis B))  ONCE - STAT,   R        Pending   Pending  Hepatitis B surface antigen  (New Admission Hemo Labs (Hepatitis B))   ONCE - STAT,   R        Pending            Vitals/Pain Today's Vitals   09/30/21 1300 09/30/21 1330 09/30/21 1415 09/30/21 1700  BP: 138/69 126/69 137/81 (!) 116/59  Pulse: (!) 108 (!) 119 (!) 113 95  Resp: 13 14 17 18   Temp:    98.2 F (36.8 C)  TempSrc:    Oral  SpO2: 100% 93% 96% 98%  PainSc:    0-No pain    Isolation Precautions No active isolations  Medications Medications  0.9 %  sodium chloride infusion (has no administration in time range)  Chlorhexidine Gluconate Cloth 2 % PADS 6 each (has no administration in time range)  pentafluoroprop-tetrafluoroeth (GEBAUERS) aerosol 1 application (has no administration in time range)  lidocaine (PF) (XYLOCAINE) 1 % injection 5 mL (  has no administration in time range)  lidocaine-prilocaine (EMLA) cream 1 application (has no administration in time range)  0.9 %  sodium chloride infusion (has no administration in time range)  0.9 %  sodium chloride infusion (has no administration in time range)  heparin injection 1,000 Units (has no administration in time range)  alteplase (CATHFLO ACTIVASE) injection 2 mg (has no administration in time range)    Mobility manual wheelchair Low fall risk   Focused Assessments Cardiac Assessment Handoff:    Lab Results  Component Value Date   TROPONINI <0.30 10/08/2014   Lab Results  Component Value Date   DDIMER 1.99 (H) 07/22/2020   Does the Patient currently have chest pain? No    R Recommendations: See Admitting Provider Note  Report given to:   Additional Notes:

## 2021-10-01 DIAGNOSIS — D649 Anemia, unspecified: Secondary | ICD-10-CM | POA: Diagnosis not present

## 2021-10-01 DIAGNOSIS — I480 Paroxysmal atrial fibrillation: Secondary | ICD-10-CM

## 2021-10-01 DIAGNOSIS — I25118 Atherosclerotic heart disease of native coronary artery with other forms of angina pectoris: Secondary | ICD-10-CM

## 2021-10-01 DIAGNOSIS — N186 End stage renal disease: Secondary | ICD-10-CM | POA: Diagnosis not present

## 2021-10-01 LAB — BASIC METABOLIC PANEL
Anion gap: 9 (ref 5–15)
BUN: 6 mg/dL — ABNORMAL LOW (ref 8–23)
CO2: 30 mmol/L (ref 22–32)
Calcium: 8.7 mg/dL — ABNORMAL LOW (ref 8.9–10.3)
Chloride: 98 mmol/L (ref 98–111)
Creatinine, Ser: 1.79 mg/dL — ABNORMAL HIGH (ref 0.61–1.24)
GFR, Estimated: 42 mL/min — ABNORMAL LOW (ref >60)
Glucose, Bld: 161 mg/dL — ABNORMAL HIGH (ref 70–99)
Potassium: 3.4 mmol/L — ABNORMAL LOW (ref 3.5–5.1)
Sodium: 137 mmol/L (ref 135–145)

## 2021-10-01 LAB — CBC
HCT: 30.3 % — ABNORMAL LOW (ref 39.0–52.0)
Hemoglobin: 9.1 g/dL — ABNORMAL LOW (ref 13.0–17.0)
MCH: 28.3 pg (ref 26.0–34.0)
MCHC: 30 g/dL (ref 30.0–36.0)
MCV: 94.1 fL (ref 80.0–100.0)
Platelets: 205 10*3/uL (ref 150–400)
RBC: 3.22 MIL/uL — ABNORMAL LOW (ref 4.22–5.81)
RDW: 18 % — ABNORMAL HIGH (ref 11.5–15.5)
WBC: 6.2 10*3/uL (ref 4.0–10.5)
nRBC: 0 % (ref 0.0–0.2)

## 2021-10-01 LAB — GLUCOSE, CAPILLARY: Glucose-Capillary: 188 mg/dL — ABNORMAL HIGH (ref 70–99)

## 2021-10-01 MED ORDER — HEPARIN SODIUM (PORCINE) 1000 UNIT/ML IJ SOLN
INTRAMUSCULAR | Status: AC
  Filled 2021-10-01: qty 4

## 2021-10-01 MED ORDER — INSULIN NPH ISOPHANE & REGULAR (70-30) 100 UNIT/ML ~~LOC~~ SUSP: 60.0000 [IU] | Freq: Two times a day (BID) | SUBCUTANEOUS | Status: DC

## 2021-10-01 MED ORDER — ATORVASTATIN CALCIUM 20 MG PO TABS: 20.0000 mg | ORAL_TABLET | Freq: Every day | ORAL | Status: DC

## 2021-10-01 NOTE — Discharge Summary (Signed)
Physician Discharge Summary  ASHFORD CLOUSE SWF:[id number] DOB: 1961/01/14 DOA: 09/30/2021  PCP: [name], MD  Admit date: 09/30/2021 Discharge date: 10/01/2021  Admitted From: Home Disposition: Home  Recommendations for Outpatient Follow-up:  Follow up with PCP in 1 week  Outpatient follow-up with hemodialysis unit as scheduled Follow up in ED if symptoms worsen or new appear   Home Health: No Equipment/Devices: None  Discharge Condition: Stable CODE STATUS: Full Diet recommendation: Heart healthy/carb modified/renal hemodialysis diet  Brief/Interim Summary:  60 y.o. male with medical history significant of remote ETOH dependence; afib; CAD; DM; pacemaker; ESRD on TTS HD; HTN; PVD s/p R BKA; and HLD presented with symptomatic anemia (sent from hemodialysis).  Hemoglobin was 7.  He was transfused 2 units of packed red cells during the hospitalization.  Nephrology consulted on him.  Hemoglobin 9.1 this morning.  Nephrology has cleared the patient for discharge.  He wants to go home today.  He will be discharged home today with outpatient follow-up with PCP within a week and hemodialysis unit as scheduled.  Discharge Diagnoses:   Symptomatic anemia Anemia of chronic disease: From renal failure -Hemoglobin 7 on presentation.  Status post 2 units packed red cells transfusion during the hospitalization.  Hemoglobin 9.1 this morning. -Nephrology has cleared the patient for discharge.  He wants to go home today.  He will be discharged home today with outpatient follow-up with PCP within a week and hemodialysis unit as scheduled. -No overt signs of bleeding.  Continue Eliquis  End-stage renal disease on hemodialysis -Completed hemodialysis session on 09/30/2021.  Nephrology evaluated the patient during the hospitalization.   Paroxysmal A. Fib -Currently intermittently tachycardic.  Continue metoprolol and Eliquis  Hypertension -Continue metoprolol.  Outpatient  follow-up  CAD status post stent -Stable.  Continue metoprolol and Lipitor  PVD status post right BKA -outpatient follow-up  Diabetes mellitus type 2 with hyperglycemia -Last A1c 5.5.  Continue home regimen.  Carb modified diet.  Discharge Instructions  Discharge Instructions     Diet - low sodium heart healthy   Complete by: As directed    Diet general   Complete by: As directed    Increase activity slowly   Complete by: As directed       Allergies as of 10/01/2021       Reactions   Cytoxan [cyclophosphamide]    pancytopenia   Keflex [cephalexin] Nausea And Vomiting   Pletal [cilostazol] Palpitations   "Can hear heart beating loudly".        Medication List     STOP taking these medications    oxyCODONE-acetaminophen 5-325 MG tablet Commonly known as: Percocet       TAKE these medications    Accu-Chek Advantage Diabetes kit Use as instructed   Accu-Chek FastClix Lancets Misc Check blood sugar TID & QHS   acetaminophen 500 MG tablet Commonly known as: TYLENOL Take 1,000 mg by mouth every 6 (six) hours as needed for moderate pain or headache.   apixaban 5 MG Tabs tablet Commonly known as: Eliquis Take 1 tablet (5 mg total) by mouth 2 (two) times daily. What changed: when to take this   atorvastatin 20 MG tablet Commonly known as: LIPITOR Take 1 tablet (20 mg total) by mouth at bedtime.   blood glucose meter kit and supplies Kit Check blood sugar TID & QHS   furosemide 80 MG tablet Commonly known as: LASIX Take 80 mg by mouth See admin instructions. Take 1 tablet (80 mg) by mouth on (  non-dialysis days) Mondays, Wednesdays, Fridays & Sundays in the morning.   glucose blood test strip Use as instructed   glucose blood test strip Commonly known as: Accu-Chek Aviva Plus Use as instructed   glucose blood test strip Commonly known as: Choice DM Fora G20 Test Strips Check blood sugar TID & QHS   insulin NPH-regular Human (70-30) 100 UNIT/ML  injection Inject 60 Units into the skin 2 (two) times daily with a meal.   metoprolol tartrate 25 MG tablet Commonly known as: LOPRESSOR Take 1 tablet (25 mg total) by mouth 2 (two) times daily.   multivitamin with minerals Tabs tablet Take 1 tablet by mouth daily.        Allergies  Allergen Reactions   Cytoxan [Cyclophosphamide]     pancytopenia   Keflex [Cephalexin] Nausea And Vomiting   Pletal [Cilostazol] Palpitations    "Can hear heart beating loudly".    Consultations: Nephrology   Procedures/Studies: CT Abdomen Pelvis Wo Contrast  Result Date: 09/16/2021 CLINICAL DATA:  Chronic back pain.  Dialysis. EXAM: CT ABDOMEN AND PELVIS WITHOUT CONTRAST TECHNIQUE: Multidetector CT imaging of the abdomen and pelvis was performed following the standard protocol without IV contrast. COMPARISON:  Renal ultrasound 05/23/2020 FINDINGS: Lower chest: Mild cardiomegaly. Small pericardial effusion. Pacer device noted. Aortic and coronary artery atherosclerosis. Hepatobiliary: Questionable tiny gallstone on image 55 series 7. Nonspecific high-density not nodularity along the medial margin of the splenic capsule measuring 1.0 by 0.6 cm on image 85 series 6. Pancreas: Unremarkable Spleen: The spleen measures 13.8 by 13.8 by 7.9 cm (volume = 790 cm^3), compatible with splenomegaly. Adrenals/Urinary Tract: Vascular calcifications in both renal hila. No definite urinary tract calculi. Nondistended urinary bladder with borderline wall thickening. Stomach/Bowel: There is a small of ascites in the right lower quadrant below the cecum, but with normal appearance of the appendix, terminal ileum and cecum without findings of pneumatosis, bowel wall thickening, or other specific bowel abnormality. There is also a small amount of pelvic ascites. Vascular/Lymphatic: Substantial atherosclerotic calcifications. No pathologic adenopathy. Reproductive: Dystrophic calcifications in the prostate gland. Other: Presacral  edema is nonspecific. Upper pelvic mesenteric edema so tracking slightly along the pelvic sidewalls, nonspecific. Small amount of pelvic ascites and right lower quadrant ascites. Musculoskeletal: Bridging spurring of the left sacroiliac joint. Postoperative findings in the lower lumbar spine including posterior decompression. Multilevel lumbar foraminal narrowing, please see dedicated lumbar spine CT report. Umbilical hernia contains adipose tissue. Postoperative findings in the subcutaneous tissues anterior to the right common femoral artery. IMPRESSION: 1. Small amount of ascites noted, along with deep mesenteric edema particularly in the pelvis and some presacral edema. I do not observe definite bowel wall thickening, pneumatosis, or a specific bowel abnormality. 2. Extensive atherosclerosis. Coronary atherosclerosis. Aortic Atherosclerosis (ICD10-I70.0). 3. Mild cardiomegaly with small pericardial effusion. 4. Splenomegaly. 5. Possible cholelithiasis. 6. Umbilical hernia contains adipose tissue. Electronically Signed   By: Van Clines M.D.   On: 09/16/2021 12:41   DG Chest 2 View  Result Date: 09/16/2021 CLINICAL DATA:  Back pain. History of atrial fibrillation. Possible pulmonary edema, the patient missed dialysis. EXAM: CHEST - 2 VIEW COMPARISON:  05/24/2020 FINDINGS: Mild enlargement of the cardiopericardial silhouette. 3.2 by 3.2 cm left suprahilar mass like density could represent malignancy or infectious process, less likely to represent confluent edema. Mild obscuration of the medial left hemidiaphragm although no discrete airspace opacity is visible in this region on the lateral projection, probably incidental. The right lung appears clear. Apical lordotic projection. Right internal  jugular dialysis catheter tip: SVC. Dual lead pacer noted. IMPRESSION: 1. Left suprahilar mass like density, 3.2 cm in diameter, could reflect pneumonia, malignancy, or less likely confluent edema/atelectasis.  Chest CT is recommended for further characterization. 2. Stable cardiomegaly. No other findings of pulmonary venous hypertension or edema. Electronically Signed   By: Van Clines M.D.   On: 09/16/2021 12:28   CT Chest Wo Contrast  Result Date: 09/16/2021 CLINICAL DATA:  60 year old male with a history of back pain and cardiomegaly EXAM: CT CHEST WITHOUT CONTRAST TECHNIQUE: Multidetector CT imaging of the chest was performed following the standard protocol without IV contrast. COMPARISON:  No prior CT FINDINGS: Cardiovascular: Cardiomegaly with trace pericardial fluid/thickening. Pacing leads terminate within the right atrium, apex right ventricle. Partially imaged hemodialysis catheter from right IJ approach. Calcifications of the left main, left anterior descending, circumflex, right coronary arteries. Normal course caliber and contour of the thoracic aorta with calcifications of the aortic arch and the branch vessels. Mediastinum/Nodes: Multiple small lymph nodes of the mediastinum. Borderline enlarged nodes in the prevascular nodal station. Unremarkable thoracic inlet. Unremarkable course of the thoracic esophagus. Lungs/Pleura: Confluent nodule of the right upper lobe measures 12 mm. Confluent airspace opacity of the left upper lobe in a peribronchovascular distribution expanding from the hilum towards the left apex. There is peripheral ground-glass opacity of the more confluent central region. No interlobular septal thickening. No pneumothorax or pleural effusion. Central airways are patent. Upper Abdomen: Liver steatosis. Diameter of the spleen measures greater than 15 cm. No acute finding of the upper abdomen. Musculoskeletal: No acute displaced fracture. Mild degenerative changes of the spine. IMPRESSION: Confluence/ground-glass airspace opacity of the left upper lobe, most likely lobar pneumonia, with a contralateral right upper lobe nodule, most likely secondary site of multifocal infection.  Follow-up chest CT is recommended once the patient has been treated for this presumed pneumonia to assure resolution of both locations. Aortic atherosclerosis and coronary artery disease. Aortic Atherosclerosis (ICD10-I70.0). Additional ancillary findings as above. Electronically Signed   By: Corrie Mckusick D.O.   On: 09/16/2021 14:40   CT L-SPINE NO CHARGE  Result Date: 09/16/2021 CLINICAL DATA:  Low back pain, trauma EXAM: CT LUMBAR SPINE WITHOUT CONTRAST TECHNIQUE: Multidetector CT imaging of the lumbar spine was performed without intravenous contrast administration. Multiplanar CT image reconstructions were also generated. COMPARISON:  06/13/2012 lumbar radiographs FINDINGS: Segmentation: The lowest lumbar type non-rib-bearing vertebra is labeled as L5. Alignment: No vertebral subluxation is observed. Vertebrae: Congenitally short pedicles in the lumbar spine. Posterior decompression from the L2-3 level through the L4-5 level. Loss of intervertebral disc height and vacuum disc phenomenon at L5-S1. No lumbar spine fracture or acute subluxation is identified. Bridging spurring of the left sacroiliac joint noted. Paraspinal and other soft tissues: Discussed under dedicated CT abdomen. Disc levels: T12-L1: Unremarkable. L1-2: Unremarkable. L2-3: Mild bilateral foraminal stenosis due to disc bulge and facet arthropathy. L3-4: Mild bilateral foraminal stenosis due to disc bulge facet spurring. L4-5: Moderate left and mild right foraminal stenosis and borderline central narrowing of the thecal sac due to disc bulge and facet arthropathy. Suspected small central disc protrusion. L5-S1: Mild bilateral foraminal stenosis due to intervertebral and facet spurring. IMPRESSION: 1. Lumbar spondylosis, degenerative disc disease, and short pedicles causing moderate impingement at L4-5 and mild impingement at L2-3, L3-4, and L5-S1 is noted above. Patient has had prior posterior decompression from the L2-3 level through the  L4-5 level. 2. Bridging spurring of the left sacroiliac joint anteriorly. Electronically Signed  By: Van Clines M.D.   On: 09/16/2021 12:46   DG Chest Port 1 View  Result Date: 09/30/2021 CLINICAL DATA:  End-stage renal disease.  Pain. EXAM: PORTABLE CHEST 1 VIEW COMPARISON:  September 16, 2021 chest x-ray and November 5th 2022 CT scan of the chest. FINDINGS: The opacity in the left apex remains. Stable cardiomegaly and pacemaker. Stable dialysis catheter. No other interval changes or acute abnormalities. IMPRESSION: 1. Persistent opacity in the left upper lobe favored 2 represent pneumonia on the recent chest x-ray and CT scan. Recommend follow-up CT imaging after completing treatment, in approximately 3 months, to confirm resolution. Electronically Signed   By: Dorise Bullion III M.D.   On: 09/30/2021 14:49   VAS [place] ABI WITH/WO TBI  Result Date: 09/20/2021  LOWER EXTREMITY DOPPLER STUDY Patient Name:  RAYNALD ROUILLARD  Date of Exam:   09/20/2021 Medical Rec #: [id number]      Accession #:    [id number] Date of Birth: 02/21/1961      Patient Gender: M Patient Age:   41 years Exam Location:  [facility] Procedure:      VAS [place] ABI WITH/WO TBI Referring Phys: [name] --------------------------------------------------------------------------------  Indications: Peripheral artery disease. High Risk Factors: Hypertension, Diabetes, past history of smoking, coronary                    artery disease. Other Factors: Right BKA, Right arm dialysis access.  Vascular Interventions: 10/27/2013 Left femoral to tib /per trunk bypass graft. Performing Technologist: Delorise Shiner RVT  Examination Guidelines: A complete evaluation includes at minimum, Doppler waveform signals and systolic blood pressure reading at the level of bilateral brachial, anterior tibial, and posterior tibial arteries, when vessel segments are accessible. Bilateral testing is considered an integral part of a complete  examination. Photoelectric Plethysmograph (PPG) waveforms and toe systolic pressure readings are included as required and additional duplex testing as needed. Limited examinations for reoccurring indications may be performed as noted.  ABI Findings: +--------+------------------+-----+--------+--------+ Right   Rt Pressure (mmHg)IndexWaveformComment  +--------+------------------+-----+--------+--------+ Brachial                               AVF      +--------+------------------+-----+--------+--------+ PTA                                    BKA      +--------+------------------+-----+--------+--------+ PERO                                   BKA      +--------+------------------+-----+--------+--------+ +---------+------------------+-----+----------+----------+ Left     Lt Pressure (mmHg)IndexWaveform  Comment    +---------+------------------+-----+----------+----------+ Brachial 158                                         +---------+------------------+-----+----------+----------+ PTA      166               1.05 monophasic           +---------+------------------+-----+----------+----------+ PERO     140               0.89 biphasic             +---------+------------------+-----+----------+----------+  Great Toe                                 amputation +---------+------------------+-----+----------+----------+ +-------+-----------+-----------+------------+------------+ ABI/TBIToday's ABIToday's TBIPrevious ABIPrevious TBI +-------+-----------+-----------+------------+------------+ Right  Amputation amputation amputation  amputation   +-------+-----------+-----------+------------+------------+ Left   1.05       amputation 0.99        amputation   +-------+-----------+-----------+------------+------------+  Left ABIs appear essentially unchanged compared to prior study on 03/15/2021.  Summary: Left: Resting left ankle-brachial index is within  normal range. No evidence of significant left lower extremity arterial disease.  *See table(s) above for measurements and observations.  Electronically signed by [name] MD on 09/20/2021 at 10:55:40 AM.    Final    VAS [place] LOWER EXTREMITY BYPASS GRAFT DUPL  Result Date: 09/20/2021 LOWER EXTREMITY ARTERIAL DUPLEX STUDY Patient Name:  FORDYCE LEPAK [facility]  Date of Exam:   09/20/2021 Medical Rec #: [id number]      Accession #:    [id number] Date of Birth: November 21, 1960      Patient Gender: M Patient Age:   47 years Exam Location:  [facility] Procedure:      VAS [place] LOWER EXTREMITY BYPASS GRAFT DUPLEX Referring Phys: [name] --------------------------------------------------------------------------------  Indications: Peripheral artery disease. High Risk Factors: Hypertension, hyperlipidemia, Diabetes, past history of                    smoking.  Vascular Interventions: 10/27/2013 Left femoral to tib /per trunk bypass graft. Current ABI:            Left: 1.05 Performing Technologist: Delorise Shiner RVT  Examination Guidelines: A complete evaluation includes B-mode imaging, spectral Doppler, color Doppler, and power Doppler as needed of all accessible portions of each vessel. Bilateral testing is considered an integral part of a complete examination. Limited examinations for reoccurring indications may be performed as noted.   +----------+--------+-----+--------+---------+--------+ LEFT      PSV cm/sRatioStenosisWaveform Comments +----------+--------+-----+--------+---------+--------+ CFA Distal216                  triphasic         +----------+--------+-----+--------+---------+--------+ PTA Distal67                   biphasic          +----------+--------+-----+--------+---------+--------+  Left Graft #1: Femoral to tibio peroneal trunk +--------------------+--------+--------+---------+--------+                     PSV cm/sStenosisWaveform Comments  +--------------------+--------+--------+---------+--------+ Inflow              90              biphasic          +--------------------+--------+--------+---------+--------+ Proximal Anastomosis53              biphasic          +--------------------+--------+--------+---------+--------+ Proximal Graft      83              triphasic         +--------------------+--------+--------+---------+--------+ Mid Graft           65              biphasic          +--------------------+--------+--------+---------+--------+ Distal Graft        62              biphasic          +--------------------+--------+--------+---------+--------+  Distal Anastomosis  69              biphasic          +--------------------+--------+--------+---------+--------+ Outflow             114             biphasic          +--------------------+--------+--------+---------+--------+   Summary: Left: Patent left fem to tib / per trunk bypass graft with no evidence of stenosis.  See table(s) above for measurements and observations. Electronically signed by [name] MD on 09/20/2021 at 4:28:26 PM.    Final       Subjective: Patient seen and examined at bedside.  Wants to go home today.  Denies worsening shortness of breath, nausea, vomiting or fever.  Discharge Exam: Vitals:   10/01/21 0845 10/01/21 0849  BP: 130/79 130/79  Pulse: (!) 107 (!) 108  Resp:  17  Temp:  97.8 F (36.6 C)  SpO2:  98%    General: Pt is alert, awake, not in acute distress.  Looks chronically ill.  Currently on room air. Cardiovascular: Intermittently tachycardic; S1/S2 + Respiratory: bilateral decreased breath sounds at bases with some scattered crackles Abdominal: Soft, NT, ND, bowel sounds + Extremities: Right BKA present.    The results of significant diagnostics from this hospitalization (including imaging, microbiology, ancillary and laboratory) are listed below for reference.     Microbiology: No  results found for this or any previous visit (from the past 240 hour(s)).   Labs: BNP (last 3 results) No results for input(s): BNP in the last 8760 hours. Basic Metabolic Panel: Recent Labs  Lab 09/30/21 1053 10/01/21 0430  NA 138 137  K 3.6 3.4*  CL 99 98  CO2 31 30  GLUCOSE 130* 161*  BUN 6* 6*  CREATININE 1.80* 1.79*  CALCIUM 8.5* 8.7*   Liver Function Tests: No results for input(s): AST, ALT, ALKPHOS, BILITOT, PROT, ALBUMIN in the last 168 hours. No results for input(s): LIPASE, AMYLASE in the last 168 hours. No results for input(s): AMMONIA in the last 168 hours. CBC: Recent Labs  Lab 09/30/21 1053 10/01/21 0430  WBC 6.2 6.2  NEUTROABS 4.3  --   HGB 7.0* 9.1*  HCT 23.7* 30.3*  MCV 94.8 94.1  PLT 161 205   Cardiac Enzymes: No results for input(s): CKTOTAL, CKMB, CKMBINDEX, TROPONINI in the last 168 hours. BNP: Invalid input(s): POCBNP CBG: Recent Labs  Lab 09/30/21 1814 09/30/21 2130 10/01/21 0629  GLUCAP 202* 280* 188*   D-Dimer No results for input(s): DDIMER in the last 72 hours. Hgb A1c No results for input(s): HGBA1C in the last 72 hours. Lipid Profile No results for input(s): CHOL, HDL, LDLCALC, TRIG, CHOLHDL, LDLDIRECT in the last 72 hours. Thyroid function studies No results for input(s): TSH, T4TOTAL, T3FREE, THYROIDAB in the last 72 hours.  Invalid input(s): FREET3 Anemia work up No results for input(s): VITAMINB12, FOLATE, FERRITIN, TIBC, IRON, RETICCTPCT in the last 72 hours. Urinalysis    Component Value Date/Time   COLORURINE YELLOW 05/23/2020 2057   APPEARANCEUR CLEAR 05/23/2020 2057   LABSPEC 1.008 05/23/2020 2057   PHURINE 5.0 05/23/2020 2057   GLUCOSEU 50 (A) 05/23/2020 2057   HGBUR LARGE (A) 05/23/2020 2057   BILIRUBINUR NEGATIVE 05/23/2020 2057   BILIRUBINUR neg 10/01/2014 1445   KETONESUR NEGATIVE 05/23/2020 2057   PROTEINUR 100 (A) 05/23/2020 2057   UROBILINOGEN 0.2 10/01/2014 1445   UROBILINOGEN 0.2 09/30/2014 2051    NITRITE  NEGATIVE 05/23/2020 2057   LEUKOCYTESUR NEGATIVE 05/23/2020 2057   Sepsis Labs Invalid input(s): PROCALCITONIN,  WBC,  LACTICIDVEN Microbiology No results found for this or any previous visit (from the past 240 hour(s)).   Time coordinating discharge: 35 minutes  SIGNED:   Aline August, MD  [facility] 10/01/2021, 9:26 AM

## 2021-10-01 NOTE — Plan of Care (Signed)
  Problem: Fluid Volume: Goal: Compliance with measures to maintain balanced fluid volume will improve Outcome: Progressing   Problem: Health Behavior/Discharge Planning: Goal: Ability to manage health-related needs will improve Outcome: Progressing

## 2021-10-01 NOTE — Progress Notes (Signed)
[facility] BRIEF PROGRESS NOTE  Alexander [name] 28-Dec-1960 [id number]   Patient seen and examined at bedside.  Hemoglobin improved to 9.1 s/p 2 units pRBC.  Reports tolerating dialysis well, net UF 3L.  States he is feeling well, denies CP, SOb, weakness, dizziness and fatigue.  Lungs CTAB, no change in LE edema, and O2 sat 99% on RA.    Patient ok from renal standpoint for discharge.    Jen Mow, PA-C [facility] Pager: [phone]

## 2021-10-01 NOTE — Plan of Care (Signed)
  Problem: Education: Goal: Knowledge of disease and its progression will improve Outcome: Progressing Goal: Individualized Educational Video(s) Outcome: Progressing   Problem: Fluid Volume: Goal: Compliance with measures to maintain balanced fluid volume will improve Outcome: Progressing   Problem: Health Behavior/Discharge Planning: Goal: Ability to manage health-related needs will improve Outcome: Progressing

## 2021-10-01 NOTE — Progress Notes (Signed)
DISCHARGE NOTE HOME TIMOTHY TRUDELL to be discharged Home per MD order. Discussed prescriptions and follow up appointments with the patient. Prescriptions given to patient; medication list explained in detail. Patient verbalized understanding.  Skin clean, dry and intact without evidence of skin break down, no evidence of skin tears noted. IV catheter discontinued intact. Site without signs and symptoms of complications. Dressing and pressure applied. Pt denies pain at the site currently. No complaints noted.  Patient free of lines, drains, and wounds.   An After Visit Summary (AVS) was printed and given to the patient. Patient escorted via wheelchair, and discharged home via private auto.  Berneta Levins, RN

## 2021-10-02 LAB — TYPE AND SCREEN
ABO/RH(D): A POS
Antibody Screen: NEGATIVE
Unit division: 0
Unit division: 0

## 2021-10-02 LAB — BPAM RBC
Blood Product Expiration Date: 202212132359
Blood Product Expiration Date: 202212142359
ISSUE DATE / TIME: 202211200158
ISSUE DATE / TIME: 202211200211
Unit Type and Rh: 6200
Unit Type and Rh: 6200

## 2021-10-02 LAB — HEPATITIS B SURFACE ANTIBODY, QUANTITATIVE: Hep B S AB Quant (Post): 40.8 m[IU]/mL (ref >9.9)

## 2021-10-03 ENCOUNTER — Other Ambulatory Visit: Payer: Self-pay

## 2021-10-03 ENCOUNTER — Telehealth: Payer: Self-pay | Admitting: Nephrology

## 2021-10-03 DIAGNOSIS — N2581 Secondary hyperparathyroidism of renal origin: Secondary | ICD-10-CM | POA: Diagnosis not present

## 2021-10-03 DIAGNOSIS — Z992 Dependence on renal dialysis: Secondary | ICD-10-CM | POA: Diagnosis not present

## 2021-10-03 DIAGNOSIS — D689 Coagulation defect, unspecified: Secondary | ICD-10-CM | POA: Diagnosis not present

## 2021-10-03 DIAGNOSIS — E0821 Diabetes mellitus due to underlying condition with diabetic nephropathy: Secondary | ICD-10-CM | POA: Diagnosis not present

## 2021-10-03 DIAGNOSIS — T8249XA Other complication of vascular dialysis catheter, initial encounter: Secondary | ICD-10-CM | POA: Diagnosis not present

## 2021-10-03 DIAGNOSIS — D509 Iron deficiency anemia, unspecified: Secondary | ICD-10-CM | POA: Diagnosis not present

## 2021-10-03 DIAGNOSIS — D631 Anemia in chronic kidney disease: Secondary | ICD-10-CM | POA: Diagnosis not present

## 2021-10-03 DIAGNOSIS — N186 End stage renal disease: Secondary | ICD-10-CM | POA: Diagnosis not present

## 2021-10-03 NOTE — Telephone Encounter (Signed)
Transition of care contact from inpatient facility  Date of discharge: 10/01/21 Date of contact: 10/03/21 Method: Phone Spoke to: Patient/ wife   Patient contacted to discuss transition of care from recent inpatient hospitalization. Patient was admitted to [facility] from .11/19-09/2021.. with discharge diagnosis of Symptomatic Anemia  Medication changes were reviewed.  Patient will follow up with his/her outpatient HD unit on: Friday 10/06/21

## 2021-10-03 NOTE — Telephone Encounter (Signed)
Received fax to the attention of Dr. Burr Medico regarding pt's Hbg. 6.9.  Faxed given to Dr. Burr Medico for review but based on notes in pt's chart he is not under the care of Dr. Burr Medico.

## 2021-10-06 ENCOUNTER — Telehealth: Payer: Self-pay

## 2021-10-06 DIAGNOSIS — N2581 Secondary hyperparathyroidism of renal origin: Secondary | ICD-10-CM | POA: Diagnosis not present

## 2021-10-06 DIAGNOSIS — Z992 Dependence on renal dialysis: Secondary | ICD-10-CM | POA: Diagnosis not present

## 2021-10-06 DIAGNOSIS — D509 Iron deficiency anemia, unspecified: Secondary | ICD-10-CM | POA: Diagnosis not present

## 2021-10-06 DIAGNOSIS — D689 Coagulation defect, unspecified: Secondary | ICD-10-CM | POA: Diagnosis not present

## 2021-10-06 DIAGNOSIS — D631 Anemia in chronic kidney disease: Secondary | ICD-10-CM | POA: Diagnosis not present

## 2021-10-06 DIAGNOSIS — E0821 Diabetes mellitus due to underlying condition with diabetic nephropathy: Secondary | ICD-10-CM | POA: Diagnosis not present

## 2021-10-06 DIAGNOSIS — N186 End stage renal disease: Secondary | ICD-10-CM | POA: Diagnosis not present

## 2021-10-06 DIAGNOSIS — T8249XA Other complication of vascular dialysis catheter, initial encounter: Secondary | ICD-10-CM | POA: Diagnosis not present

## 2021-10-06 NOTE — Telephone Encounter (Signed)
[place] at [phone] regarding the fax Dr. Burr Medico received on 09/29/2021 regarding this patient.  Dr. Burr Medico is unsure as to why the fax was sent d/t this patient is not one of her patients.  Inquired with [facility] for clarity of the fax.  Asked if this was a referral for hematology care for Dr. Burr Medico?  The on-call provider was unsure and stated he will follow up the PA S. Collins for clarity.  Will wait for [facility] to call me back with clarification.

## 2021-10-08 DIAGNOSIS — T8249XA Other complication of vascular dialysis catheter, initial encounter: Secondary | ICD-10-CM | POA: Diagnosis not present

## 2021-10-08 DIAGNOSIS — N2581 Secondary hyperparathyroidism of renal origin: Secondary | ICD-10-CM | POA: Diagnosis not present

## 2021-10-08 DIAGNOSIS — Z992 Dependence on renal dialysis: Secondary | ICD-10-CM | POA: Diagnosis not present

## 2021-10-08 DIAGNOSIS — E0821 Diabetes mellitus due to underlying condition with diabetic nephropathy: Secondary | ICD-10-CM | POA: Diagnosis not present

## 2021-10-08 DIAGNOSIS — N186 End stage renal disease: Secondary | ICD-10-CM | POA: Diagnosis not present

## 2021-10-08 DIAGNOSIS — D689 Coagulation defect, unspecified: Secondary | ICD-10-CM | POA: Diagnosis not present

## 2021-10-09 ENCOUNTER — Other Ambulatory Visit: Payer: Self-pay

## 2021-10-09 DIAGNOSIS — Z7901 Long term (current) use of anticoagulants: Secondary | ICD-10-CM

## 2021-10-09 DIAGNOSIS — I480 Paroxysmal atrial fibrillation: Secondary | ICD-10-CM

## 2021-10-09 MED ORDER — APIXABAN 5 MG PO TABS: 5.0000 mg | ORAL_TABLET | Freq: Two times a day (BID) | ORAL | 0 refills | Status: DC

## 2021-10-10 DIAGNOSIS — N186 End stage renal disease: Secondary | ICD-10-CM | POA: Diagnosis not present

## 2021-10-10 DIAGNOSIS — N2581 Secondary hyperparathyroidism of renal origin: Secondary | ICD-10-CM | POA: Diagnosis not present

## 2021-10-10 DIAGNOSIS — D689 Coagulation defect, unspecified: Secondary | ICD-10-CM | POA: Diagnosis not present

## 2021-10-10 DIAGNOSIS — Z992 Dependence on renal dialysis: Secondary | ICD-10-CM | POA: Diagnosis not present

## 2021-10-10 DIAGNOSIS — E0821 Diabetes mellitus due to underlying condition with diabetic nephropathy: Secondary | ICD-10-CM | POA: Diagnosis not present

## 2021-10-10 DIAGNOSIS — T8249XA Other complication of vascular dialysis catheter, initial encounter: Secondary | ICD-10-CM | POA: Diagnosis not present

## 2021-10-11 DIAGNOSIS — Z992 Dependence on renal dialysis: Secondary | ICD-10-CM | POA: Diagnosis not present

## 2021-10-11 DIAGNOSIS — N186 End stage renal disease: Secondary | ICD-10-CM | POA: Diagnosis not present

## 2021-10-11 DIAGNOSIS — N179 Acute kidney failure, unspecified: Secondary | ICD-10-CM | POA: Diagnosis not present

## 2021-10-12 DIAGNOSIS — D689 Coagulation defect, unspecified: Secondary | ICD-10-CM | POA: Diagnosis not present

## 2021-10-12 DIAGNOSIS — N186 End stage renal disease: Secondary | ICD-10-CM | POA: Diagnosis not present

## 2021-10-12 DIAGNOSIS — D509 Iron deficiency anemia, unspecified: Secondary | ICD-10-CM | POA: Diagnosis not present

## 2021-10-12 DIAGNOSIS — D631 Anemia in chronic kidney disease: Secondary | ICD-10-CM | POA: Diagnosis not present

## 2021-10-12 DIAGNOSIS — E0821 Diabetes mellitus due to underlying condition with diabetic nephropathy: Secondary | ICD-10-CM | POA: Diagnosis not present

## 2021-10-12 DIAGNOSIS — Z992 Dependence on renal dialysis: Secondary | ICD-10-CM | POA: Diagnosis not present

## 2021-10-12 DIAGNOSIS — I38 Endocarditis, valve unspecified: Secondary | ICD-10-CM | POA: Diagnosis not present

## 2021-10-12 DIAGNOSIS — N2581 Secondary hyperparathyroidism of renal origin: Secondary | ICD-10-CM | POA: Diagnosis not present

## 2021-10-14 DIAGNOSIS — Z992 Dependence on renal dialysis: Secondary | ICD-10-CM | POA: Diagnosis not present

## 2021-10-14 DIAGNOSIS — I38 Endocarditis, valve unspecified: Secondary | ICD-10-CM | POA: Diagnosis not present

## 2021-10-14 DIAGNOSIS — N2581 Secondary hyperparathyroidism of renal origin: Secondary | ICD-10-CM | POA: Diagnosis not present

## 2021-10-14 DIAGNOSIS — D509 Iron deficiency anemia, unspecified: Secondary | ICD-10-CM | POA: Diagnosis not present

## 2021-10-14 DIAGNOSIS — N186 End stage renal disease: Secondary | ICD-10-CM | POA: Diagnosis not present

## 2021-10-14 DIAGNOSIS — E0821 Diabetes mellitus due to underlying condition with diabetic nephropathy: Secondary | ICD-10-CM | POA: Diagnosis not present

## 2021-10-14 DIAGNOSIS — D689 Coagulation defect, unspecified: Secondary | ICD-10-CM | POA: Diagnosis not present

## 2021-10-14 DIAGNOSIS — D631 Anemia in chronic kidney disease: Secondary | ICD-10-CM | POA: Diagnosis not present

## 2021-10-17 DIAGNOSIS — D689 Coagulation defect, unspecified: Secondary | ICD-10-CM | POA: Diagnosis not present

## 2021-10-17 DIAGNOSIS — N2581 Secondary hyperparathyroidism of renal origin: Secondary | ICD-10-CM | POA: Diagnosis not present

## 2021-10-17 DIAGNOSIS — I38 Endocarditis, valve unspecified: Secondary | ICD-10-CM | POA: Diagnosis not present

## 2021-10-17 DIAGNOSIS — N186 End stage renal disease: Secondary | ICD-10-CM | POA: Diagnosis not present

## 2021-10-17 DIAGNOSIS — E0821 Diabetes mellitus due to underlying condition with diabetic nephropathy: Secondary | ICD-10-CM | POA: Diagnosis not present

## 2021-10-17 DIAGNOSIS — Z992 Dependence on renal dialysis: Secondary | ICD-10-CM | POA: Diagnosis not present

## 2021-10-17 DIAGNOSIS — D631 Anemia in chronic kidney disease: Secondary | ICD-10-CM | POA: Diagnosis not present

## 2021-10-17 DIAGNOSIS — D509 Iron deficiency anemia, unspecified: Secondary | ICD-10-CM | POA: Diagnosis not present

## 2021-10-18 DIAGNOSIS — N186 End stage renal disease: Secondary | ICD-10-CM | POA: Diagnosis not present

## 2021-10-18 DIAGNOSIS — E1151 Type 2 diabetes mellitus with diabetic peripheral angiopathy without gangrene: Secondary | ICD-10-CM | POA: Diagnosis not present

## 2021-10-18 DIAGNOSIS — D631 Anemia in chronic kidney disease: Secondary | ICD-10-CM | POA: Diagnosis not present

## 2021-10-18 DIAGNOSIS — E1122 Type 2 diabetes mellitus with diabetic chronic kidney disease: Secondary | ICD-10-CM | POA: Diagnosis not present

## 2021-10-19 DIAGNOSIS — E0821 Diabetes mellitus due to underlying condition with diabetic nephropathy: Secondary | ICD-10-CM | POA: Diagnosis not present

## 2021-10-19 DIAGNOSIS — D689 Coagulation defect, unspecified: Secondary | ICD-10-CM | POA: Diagnosis not present

## 2021-10-19 DIAGNOSIS — I38 Endocarditis, valve unspecified: Secondary | ICD-10-CM | POA: Diagnosis not present

## 2021-10-19 DIAGNOSIS — N186 End stage renal disease: Secondary | ICD-10-CM | POA: Diagnosis not present

## 2021-10-19 DIAGNOSIS — Z992 Dependence on renal dialysis: Secondary | ICD-10-CM | POA: Diagnosis not present

## 2021-10-19 DIAGNOSIS — N2581 Secondary hyperparathyroidism of renal origin: Secondary | ICD-10-CM | POA: Diagnosis not present

## 2021-10-19 DIAGNOSIS — D509 Iron deficiency anemia, unspecified: Secondary | ICD-10-CM | POA: Diagnosis not present

## 2021-10-19 DIAGNOSIS — D631 Anemia in chronic kidney disease: Secondary | ICD-10-CM | POA: Diagnosis not present

## 2021-10-21 DIAGNOSIS — Z992 Dependence on renal dialysis: Secondary | ICD-10-CM | POA: Diagnosis not present

## 2021-10-21 DIAGNOSIS — N186 End stage renal disease: Secondary | ICD-10-CM | POA: Diagnosis not present

## 2021-10-21 DIAGNOSIS — E0821 Diabetes mellitus due to underlying condition with diabetic nephropathy: Secondary | ICD-10-CM | POA: Diagnosis not present

## 2021-10-21 DIAGNOSIS — I38 Endocarditis, valve unspecified: Secondary | ICD-10-CM | POA: Diagnosis not present

## 2021-10-21 DIAGNOSIS — D631 Anemia in chronic kidney disease: Secondary | ICD-10-CM | POA: Diagnosis not present

## 2021-10-21 DIAGNOSIS — N2581 Secondary hyperparathyroidism of renal origin: Secondary | ICD-10-CM | POA: Diagnosis not present

## 2021-10-21 DIAGNOSIS — D509 Iron deficiency anemia, unspecified: Secondary | ICD-10-CM | POA: Diagnosis not present

## 2021-10-21 DIAGNOSIS — D689 Coagulation defect, unspecified: Secondary | ICD-10-CM | POA: Diagnosis not present

## 2021-10-24 DIAGNOSIS — D509 Iron deficiency anemia, unspecified: Secondary | ICD-10-CM | POA: Diagnosis not present

## 2021-10-24 DIAGNOSIS — D689 Coagulation defect, unspecified: Secondary | ICD-10-CM | POA: Diagnosis not present

## 2021-10-24 DIAGNOSIS — Z992 Dependence on renal dialysis: Secondary | ICD-10-CM | POA: Diagnosis not present

## 2021-10-24 DIAGNOSIS — D631 Anemia in chronic kidney disease: Secondary | ICD-10-CM | POA: Diagnosis not present

## 2021-10-24 DIAGNOSIS — I38 Endocarditis, valve unspecified: Secondary | ICD-10-CM | POA: Diagnosis not present

## 2021-10-24 DIAGNOSIS — N186 End stage renal disease: Secondary | ICD-10-CM | POA: Diagnosis not present

## 2021-10-24 DIAGNOSIS — E0821 Diabetes mellitus due to underlying condition with diabetic nephropathy: Secondary | ICD-10-CM | POA: Diagnosis not present

## 2021-10-24 DIAGNOSIS — N2581 Secondary hyperparathyroidism of renal origin: Secondary | ICD-10-CM | POA: Diagnosis not present

## 2021-10-25 ENCOUNTER — Emergency Department (HOSPITAL_COMMUNITY): Payer: Medicare Other

## 2021-10-25 ENCOUNTER — Encounter (HOSPITAL_COMMUNITY): Payer: Self-pay | Admitting: Emergency Medicine

## 2021-10-25 ENCOUNTER — Inpatient Hospital Stay (HOSPITAL_COMMUNITY)
Admit: 2021-10-25 | Discharge: 2021-11-06 | DRG: 288 | Disposition: A | Discharge status: Home / Self Care | Payer: Medicare Other | Attending: Family Medicine | Admitting: Family Medicine

## 2021-10-25 DIAGNOSIS — K838 Other specified diseases of biliary tract: Secondary | ICD-10-CM | POA: Diagnosis not present

## 2021-10-25 DIAGNOSIS — Z992 Dependence on renal dialysis: Secondary | ICD-10-CM

## 2021-10-25 DIAGNOSIS — K769 Liver disease, unspecified: Secondary | ICD-10-CM | POA: Diagnosis not present

## 2021-10-25 DIAGNOSIS — Z950 Presence of cardiac pacemaker: Secondary | ICD-10-CM

## 2021-10-25 DIAGNOSIS — J9601 Acute respiratory failure with hypoxia: Secondary | ICD-10-CM | POA: Diagnosis not present

## 2021-10-25 DIAGNOSIS — F1010 Alcohol abuse, uncomplicated: Secondary | ICD-10-CM | POA: Diagnosis present

## 2021-10-25 DIAGNOSIS — Z87891 Personal history of nicotine dependence: Secondary | ICD-10-CM

## 2021-10-25 DIAGNOSIS — Z681 Body mass index (BMI) 19 or less, adult: Secondary | ICD-10-CM | POA: Diagnosis not present

## 2021-10-25 DIAGNOSIS — R161 Splenomegaly, not elsewhere classified: Secondary | ICD-10-CM | POA: Diagnosis not present

## 2021-10-25 DIAGNOSIS — R7401 Elevation of levels of liver transaminase levels: Secondary | ICD-10-CM

## 2021-10-25 DIAGNOSIS — L97429 Non-pressure chronic ulcer of left heel and midfoot with unspecified severity: Secondary | ICD-10-CM | POA: Diagnosis present

## 2021-10-25 DIAGNOSIS — I2510 Atherosclerotic heart disease of native coronary artery without angina pectoris: Secondary | ICD-10-CM | POA: Diagnosis not present

## 2021-10-25 DIAGNOSIS — M5416 Radiculopathy, lumbar region: Secondary | ICD-10-CM | POA: Diagnosis not present

## 2021-10-25 DIAGNOSIS — I10 Essential (primary) hypertension: Secondary | ICD-10-CM | POA: Diagnosis not present

## 2021-10-25 DIAGNOSIS — M48061 Spinal stenosis, lumbar region without neurogenic claudication: Secondary | ICD-10-CM | POA: Diagnosis present

## 2021-10-25 DIAGNOSIS — Z6833 Body mass index (BMI) 33.0-33.9, adult: Secondary | ICD-10-CM

## 2021-10-25 DIAGNOSIS — Y828 Other medical devices associated with adverse incidents: Secondary | ICD-10-CM | POA: Diagnosis not present

## 2021-10-25 DIAGNOSIS — Z833 Family history of diabetes mellitus: Secondary | ICD-10-CM

## 2021-10-25 DIAGNOSIS — J90 Pleural effusion, not elsewhere classified: Secondary | ICD-10-CM | POA: Diagnosis not present

## 2021-10-25 DIAGNOSIS — I4821 Permanent atrial fibrillation: Secondary | ICD-10-CM | POA: Diagnosis present

## 2021-10-25 DIAGNOSIS — I132 Hypertensive heart and chronic kidney disease with heart failure and with stage 5 chronic kidney disease, or end stage renal disease: Secondary | ICD-10-CM | POA: Diagnosis present

## 2021-10-25 DIAGNOSIS — N186 End stage renal disease: Secondary | ICD-10-CM | POA: Diagnosis present

## 2021-10-25 DIAGNOSIS — T82898A Other specified complication of vascular prosthetic devices, implants and grafts, initial encounter: Secondary | ICD-10-CM | POA: Diagnosis not present

## 2021-10-25 DIAGNOSIS — I081 Rheumatic disorders of both mitral and tricuspid valves: Secondary | ICD-10-CM | POA: Diagnosis present

## 2021-10-25 DIAGNOSIS — I25118 Atherosclerotic heart disease of native coronary artery with other forms of angina pectoris: Secondary | ICD-10-CM | POA: Diagnosis not present

## 2021-10-25 DIAGNOSIS — I3139 Other pericardial effusion (noninflammatory): Secondary | ICD-10-CM | POA: Diagnosis not present

## 2021-10-25 DIAGNOSIS — R748 Abnormal levels of other serum enzymes: Secondary | ICD-10-CM | POA: Diagnosis not present

## 2021-10-25 DIAGNOSIS — D631 Anemia in chronic kidney disease: Secondary | ICD-10-CM | POA: Diagnosis not present

## 2021-10-25 DIAGNOSIS — G253 Myoclonus: Secondary | ICD-10-CM | POA: Diagnosis present

## 2021-10-25 DIAGNOSIS — E871 Hypo-osmolality and hyponatremia: Secondary | ICD-10-CM | POA: Diagnosis present

## 2021-10-25 DIAGNOSIS — I120 Hypertensive chronic kidney disease with stage 5 chronic kidney disease or end stage renal disease: Secondary | ICD-10-CM | POA: Diagnosis not present

## 2021-10-25 DIAGNOSIS — E11649 Type 2 diabetes mellitus with hypoglycemia without coma: Secondary | ICD-10-CM | POA: Diagnosis not present

## 2021-10-25 DIAGNOSIS — Y831 Surgical operation with implant of artificial internal device as the cause of abnormal reaction of the patient, or of later complication, without mention of misadventure at the time of the procedure: Secondary | ICD-10-CM | POA: Diagnosis present

## 2021-10-25 DIAGNOSIS — E11621 Type 2 diabetes mellitus with foot ulcer: Secondary | ICD-10-CM | POA: Diagnosis present

## 2021-10-25 DIAGNOSIS — E669 Obesity, unspecified: Secondary | ICD-10-CM | POA: Diagnosis present

## 2021-10-25 DIAGNOSIS — Z794 Long term (current) use of insulin: Secondary | ICD-10-CM | POA: Diagnosis not present

## 2021-10-25 DIAGNOSIS — I330 Acute and subacute infective endocarditis: Principal | ICD-10-CM | POA: Diagnosis present

## 2021-10-25 DIAGNOSIS — D696 Thrombocytopenia, unspecified: Secondary | ICD-10-CM | POA: Diagnosis present

## 2021-10-25 DIAGNOSIS — T827XXA Infection and inflammatory reaction due to other cardiac and vascular devices, implants and grafts, initial encounter: Secondary | ICD-10-CM | POA: Diagnosis present

## 2021-10-25 DIAGNOSIS — Z888 Allergy status to other drugs, medicaments and biological substances status: Secondary | ICD-10-CM

## 2021-10-25 DIAGNOSIS — R1011 Right upper quadrant pain: Secondary | ICD-10-CM | POA: Diagnosis not present

## 2021-10-25 DIAGNOSIS — I4510 Unspecified right bundle-branch block: Secondary | ICD-10-CM | POA: Diagnosis present

## 2021-10-25 DIAGNOSIS — Z800 Family history of malignant neoplasm of digestive organs: Secondary | ICD-10-CM

## 2021-10-25 DIAGNOSIS — M545 Low back pain, unspecified: Secondary | ICD-10-CM | POA: Diagnosis not present

## 2021-10-25 DIAGNOSIS — E8770 Fluid overload, unspecified: Secondary | ICD-10-CM | POA: Diagnosis present

## 2021-10-25 DIAGNOSIS — I739 Peripheral vascular disease, unspecified: Secondary | ICD-10-CM | POA: Diagnosis present

## 2021-10-25 DIAGNOSIS — K7030 Alcoholic cirrhosis of liver without ascites: Secondary | ICD-10-CM | POA: Diagnosis present

## 2021-10-25 DIAGNOSIS — Z89511 Acquired absence of right leg below knee: Secondary | ICD-10-CM

## 2021-10-25 DIAGNOSIS — K219 Gastro-esophageal reflux disease without esophagitis: Secondary | ICD-10-CM | POA: Diagnosis present

## 2021-10-25 DIAGNOSIS — K828 Other specified diseases of gallbladder: Secondary | ICD-10-CM | POA: Diagnosis not present

## 2021-10-25 DIAGNOSIS — Z886 Allergy status to analgesic agent status: Secondary | ICD-10-CM

## 2021-10-25 DIAGNOSIS — E1122 Type 2 diabetes mellitus with diabetic chronic kidney disease: Secondary | ICD-10-CM | POA: Diagnosis not present

## 2021-10-25 DIAGNOSIS — E785 Hyperlipidemia, unspecified: Secondary | ICD-10-CM | POA: Diagnosis present

## 2021-10-25 DIAGNOSIS — E440 Moderate protein-calorie malnutrition: Secondary | ICD-10-CM | POA: Diagnosis not present

## 2021-10-25 DIAGNOSIS — E1151 Type 2 diabetes mellitus with diabetic peripheral angiopathy without gangrene: Secondary | ICD-10-CM | POA: Diagnosis present

## 2021-10-25 DIAGNOSIS — J189 Pneumonia, unspecified organism: Secondary | ICD-10-CM | POA: Diagnosis not present

## 2021-10-25 DIAGNOSIS — T82590A Other mechanical complication of surgically created arteriovenous fistula, initial encounter: Secondary | ICD-10-CM | POA: Diagnosis not present

## 2021-10-25 DIAGNOSIS — D62 Acute posthemorrhagic anemia: Secondary | ICD-10-CM | POA: Diagnosis present

## 2021-10-25 DIAGNOSIS — Z20822 Contact with and (suspected) exposure to covid-19: Secondary | ICD-10-CM | POA: Diagnosis present

## 2021-10-25 DIAGNOSIS — Z7901 Long term (current) use of anticoagulants: Secondary | ICD-10-CM

## 2021-10-25 DIAGNOSIS — I517 Cardiomegaly: Secondary | ICD-10-CM | POA: Diagnosis not present

## 2021-10-25 DIAGNOSIS — R059 Cough, unspecified: Secondary | ICD-10-CM | POA: Diagnosis not present

## 2021-10-25 DIAGNOSIS — R0602 Shortness of breath: Secondary | ICD-10-CM | POA: Diagnosis not present

## 2021-10-25 DIAGNOSIS — I495 Sick sinus syndrome: Secondary | ICD-10-CM | POA: Diagnosis present

## 2021-10-25 DIAGNOSIS — M86071 Acute hematogenous osteomyelitis, right ankle and foot: Secondary | ICD-10-CM | POA: Diagnosis not present

## 2021-10-25 DIAGNOSIS — D649 Anemia, unspecified: Secondary | ICD-10-CM | POA: Diagnosis not present

## 2021-10-25 DIAGNOSIS — I5033 Acute on chronic diastolic (congestive) heart failure: Secondary | ICD-10-CM | POA: Diagnosis present

## 2021-10-25 DIAGNOSIS — I96 Gangrene, not elsewhere classified: Secondary | ICD-10-CM | POA: Diagnosis not present

## 2021-10-25 DIAGNOSIS — G8929 Other chronic pain: Secondary | ICD-10-CM | POA: Diagnosis present

## 2021-10-25 DIAGNOSIS — R1310 Dysphagia, unspecified: Secondary | ICD-10-CM | POA: Diagnosis present

## 2021-10-25 DIAGNOSIS — Z79899 Other long term (current) drug therapy: Secondary | ICD-10-CM

## 2021-10-25 DIAGNOSIS — M549 Dorsalgia, unspecified: Secondary | ICD-10-CM | POA: Diagnosis not present

## 2021-10-25 DIAGNOSIS — R0902 Hypoxemia: Secondary | ICD-10-CM | POA: Diagnosis not present

## 2021-10-25 DIAGNOSIS — E1129 Type 2 diabetes mellitus with other diabetic kidney complication: Secondary | ICD-10-CM | POA: Diagnosis not present

## 2021-10-25 DIAGNOSIS — Z89432 Acquired absence of left foot: Secondary | ICD-10-CM

## 2021-10-25 DIAGNOSIS — S32029D Unspecified fracture of second lumbar vertebra, subsequent encounter for fracture with routine healing: Secondary | ICD-10-CM

## 2021-10-25 DIAGNOSIS — Z841 Family history of disorders of kidney and ureter: Secondary | ICD-10-CM

## 2021-10-25 DIAGNOSIS — I4891 Unspecified atrial fibrillation: Secondary | ICD-10-CM

## 2021-10-25 DIAGNOSIS — Z955 Presence of coronary angioplasty implant and graft: Secondary | ICD-10-CM

## 2021-10-25 DIAGNOSIS — R58 Hemorrhage, not elsewhere classified: Secondary | ICD-10-CM | POA: Diagnosis present

## 2021-10-25 DIAGNOSIS — L97509 Non-pressure chronic ulcer of other part of unspecified foot with unspecified severity: Secondary | ICD-10-CM | POA: Diagnosis not present

## 2021-10-25 DIAGNOSIS — T827XXD Infection and inflammatory reaction due to other cardiac and vascular devices, implants and grafts, subsequent encounter: Secondary | ICD-10-CM | POA: Diagnosis not present

## 2021-10-25 DIAGNOSIS — R7989 Other specified abnormal findings of blood chemistry: Secondary | ICD-10-CM | POA: Diagnosis not present

## 2021-10-25 DIAGNOSIS — Z9181 History of falling: Secondary | ICD-10-CM

## 2021-10-25 DIAGNOSIS — Z803 Family history of malignant neoplasm of breast: Secondary | ICD-10-CM

## 2021-10-25 DIAGNOSIS — E8809 Other disorders of plasma-protein metabolism, not elsewhere classified: Secondary | ICD-10-CM | POA: Diagnosis present

## 2021-10-25 DIAGNOSIS — Z8249 Family history of ischemic heart disease and other diseases of the circulatory system: Secondary | ICD-10-CM

## 2021-10-25 LAB — CBC WITH DIFFERENTIAL/PLATELET
Abs Immature Granulocytes: 0.09 10*3/uL — ABNORMAL HIGH (ref 0.00–0.07)
Basophils Absolute: 0 10*3/uL (ref 0.0–0.1)
Basophils Relative: 0 %
Eosinophils Absolute: 0 10*3/uL (ref 0.0–0.5)
Eosinophils Relative: 0 %
HCT: 29.2 % — ABNORMAL LOW (ref 39.0–52.0)
Hemoglobin: 8.9 g/dL — ABNORMAL LOW (ref 13.0–17.0)
Immature Granulocytes: 1 %
Lymphocytes Relative: 6 %
Lymphs Abs: 0.8 10*3/uL (ref 0.7–4.0)
MCH: 27.1 pg (ref 26.0–34.0)
MCHC: 30.5 g/dL (ref 30.0–36.0)
MCV: 88.8 fL (ref 80.0–100.0)
Monocytes Absolute: 0.8 10*3/uL (ref 0.1–1.0)
Monocytes Relative: 6 %
Neutro Abs: 11.4 10*3/uL — ABNORMAL HIGH (ref 1.7–7.7)
Neutrophils Relative %: 87 %
Platelets: 97 10*3/uL — ABNORMAL LOW (ref 150–400)
RBC: 3.29 MIL/uL — ABNORMAL LOW (ref 4.22–5.81)
RDW: 20.1 % — ABNORMAL HIGH (ref 11.5–15.5)
Smear Review: NORMAL
WBC: 13 10*3/uL — ABNORMAL HIGH (ref 4.0–10.5)
nRBC: 0 % (ref 0.0–0.2)

## 2021-10-25 LAB — COMPREHENSIVE METABOLIC PANEL
ALT: 73 U/L — ABNORMAL HIGH (ref 0–44)
AST: 92 U/L — ABNORMAL HIGH (ref 15–41)
Albumin: 2.1 g/dL — ABNORMAL LOW (ref 3.5–5.0)
Alkaline Phosphatase: 82 U/L (ref 38–126)
Anion gap: 15 (ref 5–15)
BUN: 42 mg/dL — ABNORMAL HIGH (ref 8–23)
CO2: 25 mmol/L (ref 22–32)
Calcium: 7.9 mg/dL — ABNORMAL LOW (ref 8.9–10.3)
Chloride: 87 mmol/L — ABNORMAL LOW (ref 98–111)
Creatinine, Ser: 4.55 mg/dL — ABNORMAL HIGH (ref 0.61–1.24)
GFR, Estimated: 14 mL/min — ABNORMAL LOW (ref >60)
Glucose, Bld: 133 mg/dL — ABNORMAL HIGH (ref 70–99)
Potassium: 3.8 mmol/L (ref 3.5–5.1)
Sodium: 127 mmol/L — ABNORMAL LOW (ref 135–145)
Total Bilirubin: 1.7 mg/dL — ABNORMAL HIGH (ref 0.3–1.2)
Total Protein: 6.5 g/dL (ref 6.5–8.1)

## 2021-10-25 LAB — RESP PANEL BY RT-PCR (FLU A&B, COVID) ARPGX2
Influenza A by PCR: NEGATIVE
Influenza B by PCR: NEGATIVE
SARS Coronavirus 2 by RT PCR: NEGATIVE

## 2021-10-25 LAB — TROPONIN I (HIGH SENSITIVITY)
Troponin I (High Sensitivity): 40 ng/L — ABNORMAL HIGH (ref <18)
Troponin I (High Sensitivity): 43 ng/L — ABNORMAL HIGH (ref <18)

## 2021-10-25 LAB — BRAIN NATRIURETIC PEPTIDE: B Natriuretic Peptide: 1181.3 pg/mL — ABNORMAL HIGH (ref 0.0–100.0)

## 2021-10-25 LAB — CBG MONITORING, ED: Glucose-Capillary: 110 mg/dL — ABNORMAL HIGH (ref 70–99)

## 2021-10-25 MED ORDER — METOPROLOL TARTRATE 5 MG/5ML IV SOLN
5.0000 mg | Freq: Once | INTRAVENOUS | Status: AC
  Administered 2021-10-25: 23:00:00 5 mg via INTRAVENOUS
  Filled 2021-10-25: qty 5

## 2021-10-25 MED ORDER — HYDROMORPHONE HCL 1 MG/ML IJ SOLN
0.5000 mg | Freq: Once | INTRAMUSCULAR | Status: AC
  Administered 2021-10-25: 23:00:00 0.5 mg via INTRAVENOUS
  Filled 2021-10-25: qty 1

## 2021-10-25 MED ORDER — SODIUM CHLORIDE 0.9 % IV SOLN
2.0000 g | Freq: Once | INTRAVENOUS | Status: AC
  Administered 2021-10-25: 23:00:00 2 g via INTRAVENOUS
  Filled 2021-10-25: qty 20

## 2021-10-25 MED ORDER — DOXYCYCLINE HYCLATE 100 MG PO TABS
100.0000 mg | ORAL_TABLET | Freq: Two times a day (BID) | ORAL | Status: DC
  Administered 2021-10-25 – 2021-10-27 (×4): 100 mg via ORAL
  Filled 2021-10-25 (×4): qty 1

## 2021-10-25 MED ORDER — ONDANSETRON HCL 4 MG/2ML IJ SOLN
4.0000 mg | Freq: Once | INTRAMUSCULAR | Status: AC
  Administered 2021-10-25: 23:00:00 4 mg via INTRAVENOUS
  Filled 2021-10-25: qty 2

## 2021-10-25 NOTE — ED Provider Notes (Signed)
Emergency Medicine Provider Triage Evaluation Note  Alexander [name] , a 60 y.o. male  was evaluated in triage.  Pt complains of numbness of breath.  He has not missed any dialysis.  He states that he was having pneumonia last month.  He has back pain.  He feels very short of breath.  His shortness of breath is worse when he lays down.  He reports compliance with his medications.  Review of Systems  Positive: Short of breath, back pain (chronic) Negative: See above  Physical Exam  BP (!) 139/115 (BP Location: Left Arm)    Pulse (!) 148    Temp 98.8 F (37.1 C) (Oral)    Resp 20    SpO2 92%  Gen:   Awake, no distress   Resp:  Normal effort  MSK:   Moves extremities without difficulty  Other:  RRR  Medical Decision Making  Medically screening exam initiated at 6:31 PM.  Appropriate orders placed.  [name] was informed that the remainder of the evaluation will be completed by another provider, this initial triage assessment does not replace that evaluation, and the importance of remaining in the ED until their evaluation is complete.  Note: Portions of this report may have been transcribed using voice recognition software. Every effort was made to ensure accuracy; however, inadvertent computerized transcription errors may be present    Lorin Glass, PA-C 10/25/21 1832    Charlesetta Shanks, MD 10/28/21 [phone]

## 2021-10-25 NOTE — ED Provider Notes (Signed)
[facility] EMERGENCY DEPARTMENT Provider Note   CSN: [id number] Arrival date & time: 10/25/21  1630     History Chief Complaint  Patient presents with   Shortness of Breath   Back Pain    Alexander [name] is a 60 y.o. male with a history of end-stage renal disease reports he goes to dialysis on Tuesday, Thursday, Saturday, history of diabetes w/ left LE amputation, HTN, HLD, A Fib on eliquis and metoprolol, presenting to emergency department with cough, congestion, fatigue.  The patient ports that he was diagnosed with pneumonia approximately 2 months ago, put on antibiotics early November which he completed.  He says he continues to have productive cough.  He reports worsening dyspnea and orthopnea with lying down.  He reports he coughs all night.  He says he just feels sick and low energy.  He reports that he did go to dialysis as scheduled, most recently yesterday, and is due tomorrow.  He also complains of chronic back pain, says he is pending a neurosurgery evaluation.  He is unsure whether he took his medications today.  He states he produces very little urine.  HPI     Past Medical History:  Diagnosis Date   Alcohol abuse    Anemia    Arthritis    "patient does not think so."   Atrial fibrillation ([place])    Cardiac syncope 10/07/2014   rapid A fib with 8 sec pauses on converison with syncope- temp pacing wire placed then PPM   Cataract    BILATERAL   Coronary artery disease    Diabetes mellitus    dx---been  awhile   ESRD (end stage renal disease) ([place])    [place]   GERD (gastroesophageal reflux disease)    History of blood transfusion    Hyperlipemia    Hypertension    Osteomyelitis ([place])    right foot   Peripheral vascular disease ([place])    Presence of permanent cardiac pacemaker 10/08/2014   Medtronic    Patient Active Problem List   Diagnosis Date Noted   Atrial fibrillation with RVR ([place]) 10/25/2021   Symptomatic anemia  09/30/2021   ESRD (end stage renal disease) ([place]) 09/30/2021   PAF (paroxysmal atrial fibrillation) ([place]) 09/30/2021   NSVT (nonsustained ventricular tachycardia)    Pancytopenia ([place])    Acute blood loss anemia 07/18/2020   Hemodialysis patient ([place]) 06/04/2020   Necrotizing glomerulonephritis 06/04/2020   Heme positive stool    Melena    Upper GI bleeding 05/23/2020   Acute GI bleeding 05/23/2020   Acute hematogenous osteomyelitis of right foot ([place]) 03/11/2020   Gangrene of right foot ([place])    Diabetic foot ulcer associated with type 2 diabetes mellitus ([place]) 02/19/2020   Heel ulcer ([place]) 11/05/2019   Atherosclerotic PVD with ulceration ([place]) 09/09/2017   Essential hypertension, benign 02/07/2015   Renal insufficiency 02/07/2015   Post PTCA 11/09/2014   S/P PTCA (percutaneous transluminal coronary angioplasty) 11/09/2014   Pacemaker: [place] DR MRI [id number] Dual chamber pacemaker 10/08/2014 10/08/2014   Encounter for care of pacemaker 10/08/2014   Sinus node dysfunction ([place]) 10/08/2014   Cardiac asystole ([place]) 10/07/2014   Cardiac syncope 10/07/2014   Diabetes mellitus with stage 3 chronic kidney disease ([place]) 10/07/2014   CAD (coronary artery disease), native coronary artery 10/07/2014   Diabetes mellitus due to underlying condition without complications ([place]) 29/79/8921   Anemia, unspecified 05/25/2014   Syncope 04/30/2014   Atherosclerosis of native  arteries of the extremities with gangrene ([place]) 12/16/2013   Volume overload 10/30/2013   Acute renal failure ([place]) 10/28/2013   PVD (peripheral vascular disease) ([place]) 10/27/2013   Leukocytosis 10/21/2013   Anemia 10/21/2013   Osteomyelitis of left foot ([place]) 08/19/2013   Spinal stenosis, lumbar region, with neurogenic claudication 06/12/2012    Class: Diagnosis of   Diabetes mellitus with end stage renal disease ([place]) 03/15/2009   ALCOHOL ABUSE 03/15/2009   TOBACCO USE 03/15/2009   HYPERTENSION 03/15/2009     Past Surgical History:  Procedure Laterality Date   A/V FISTULAGRAM Right 05/01/2021   Procedure: A/V FISTULAGRAM;  Surgeon: Waynetta Sandy, MD;  Location: [facility];  Service: Cardiovascular;  Laterality: Right;  failed fistulagram   ABDOMINAL AORTOGRAM W/LOWER EXTREMITY N/A 09/09/2017   Procedure: ABDOMINAL AORTOGRAM W/LOWER EXTREMITY;  Surgeon: Angelia Mould, MD;  Location: [facility];  Service: Cardiovascular;  Laterality: N/A;   ABDOMINAL AORTOGRAM W/LOWER EXTREMITY Right 11/11/2019   Procedure: ABDOMINAL AORTOGRAM W/LOWER EXTREMITY;  Surgeon: Marty Heck, MD;  Location: [facility];  Service: Cardiovascular;  Laterality: Right;   AMPUTATION Left 08/19/2013   Procedure: AMPUTATION RAY;  Surgeon: Alta Corning, MD;  Location: [facility];  Service: Orthopedics;  Laterality: Left;  ray amputation left 5th   AMPUTATION Left 10/27/2013   Procedure: AMPUTATION DIGIT-LEFT 4TH TOE, 4th and 5th metatarsal.;  Surgeon: Angelia Mould, MD;  Location: [facility];  Service: Vascular;  Laterality: Left;   AMPUTATION Left 11/04/2013   Procedure: LEFT FOOT TRANS-METATARSAL AMPUTATION WITH WOUND CLOSURE ;  Surgeon: Alta Corning, MD;  Location: [facility];  Service: Orthopedics;  Laterality: Left;   AMPUTATION Right 09/10/2017   Procedure: RIGHT FOURTH AND FIFTH TOE AMPUTATION;  Surgeon: Angelia Mould, MD;  Location: [facility];  Service: Vascular;  Laterality: Right;   AMPUTATION Right 02/19/2020   Procedure: AMPUTATION RIGHT 3RD TOE;  Surgeon: Angelia Mould, MD;  Location: [facility];  Service: Vascular;  Laterality: Right;   AMPUTATION Right 03/11/2020   Procedure: RIGHT BELOW KNEE AMPUTATION;  Surgeon: Newt Minion, MD;  Location: [facility];  Service: Orthopedics;  Laterality: Right;   APPLICATION OF WOUND VAC Right 02/19/2020   Procedure: Application Of Wound Vac Right foot;  Surgeon: Angelia Mould, MD;  Location: [facility];  Service:  Vascular;  Laterality: Right;   AV FISTULA PLACEMENT Right 03/27/2021   Procedure: RIGHT ARM ARTERIOVENOUS (AV) FISTULA;  Surgeon: Angelia Mould, MD;  Location: [facility];  Service: Vascular;  Laterality: Right;   AV FISTULA PLACEMENT Right 05/10/2021   Procedure: RIGHT ARM ARTERIOVENOUS (AV) FISTULA;  Surgeon: Waynetta Sandy, MD;  Location: [facility];  Service: Vascular;  Laterality: Right;   G. L. Garcia Right 08/11/2021   Procedure: RIGHT ARM SECOND STAGE BASILIC VEIN TRANSPOSITION;  Surgeon: Waynetta Sandy, MD;  Location: [facility];  Service: Vascular;  Laterality: Right;   BIOPSY  07/20/2020   Procedure: BIOPSY;  Surgeon: Irving Copas., MD;  Location: [facility];  Service: Gastroenterology;;   CARDIAC CATHETERIZATION  10/07/2014   Procedure: LEFT HEART CATH AND CORONARY ANGIOGRAPHY;  Surgeon: Laverda Page, MD;  Location: [facility] CATH LAB;  Service: Cardiovascular;;   COLONOSCOPY W/ POLYPECTOMY     COLONOSCOPY WITH PROPOFOL N/A 05/25/2020   Procedure: COLONOSCOPY WITH PROPOFOL;  Surgeon: Gatha Mayer, MD;  Location: [facility];  Service: Endoscopy;  Laterality: N/A;   EMBOLECTOMY  Right 09/12/2017   Procedure: Thrombectomy  & Redo Right Below Knee Popliteal Artery Bypass Graft  ;  Surgeon: Waynetta Sandy, MD;  Location: [facility] OR;  Service: Vascular;  Laterality: Right;   ENTEROSCOPY N/A 07/20/2020   Procedure: ENTEROSCOPY;  Surgeon: Rush Landmark Telford Nab., MD;  Location: [facility];  Service: Gastroenterology;  Laterality: N/A;   ESOPHAGOGASTRODUODENOSCOPY (EGD) WITH PROPOFOL N/A 05/24/2020   Procedure: ESOPHAGOGASTRODUODENOSCOPY (EGD) WITH PROPOFOL;  Surgeon: Ladene Artist, MD;  [facility];  Service: Endoscopy;  Laterality: N/A;   EYE SURGERY Bilateral    cataract   FEMORAL-TIBIAL BYPASS GRAFT Left 10/27/2013   Procedure: BYPASS GRAFT LEFT FEMORAL- POSTERIOR TIBIAL ARTERY;  Surgeon: Angelia Mould, MD;  Location: [facility];  Service: Vascular;  Laterality: Left;   FEMORAL-TIBIAL BYPASS GRAFT Right 09/10/2017   Procedure: RIGHT SUPERFICIAL  FEMORAL ARTERY-BELOW KNEE POPLITEAL ARTERY BYPASS GRAFT WITH VEIN;  Surgeon: Angelia Mould, MD;  Location: [facility];  Service: Vascular;  Laterality: Right;   GIVENS CAPSULE STUDY N/A 07/20/2020   Procedure: GIVENS CAPSULE STUDY;  Surgeon: Irving Copas., MD;  Location: [place];  Service: Gastroenterology;  Laterality: N/A;  capsule placed through scope into duodenum @ 0936   I & D EXTREMITY Left 10/27/2013   Procedure: IRRIGATION AND DEBRIDEMENT EXTREMITY- LEFT FOOT;  Surgeon: Angelia Mould, MD;  Location: [facility];  Service: Vascular;  Laterality: Left;   I & D EXTREMITY Right 01/26/2020   Procedure: IRRIGATION AND DEBRIDEMENT EXTREMITY RIGHT FOOT WOUND;  Surgeon: Angelia Mould, MD;  Location: [facility];  Service: Vascular;  Laterality: Right;   INSERT / REPLACE / REMOVE PACEMAKER     INTRAOPERATIVE ARTERIOGRAM Right 09/10/2017   Procedure: INTRA OPERATIVE ARTERIOGRAM;  Surgeon: Angelia Mould, MD;  Location: [facility];  Service: Vascular;  Laterality: Right;   IR ANGIOGRAM FOLLOW UP STUDY  09/09/2017   IR FLUORO GUIDE CV LINE RIGHT  05/31/2020   IR US GUIDE VASC ACCESS RIGHT  06/01/2020   LEFT HEART CATH AND CORONARY ANGIOGRAPHY N/A 08/19/2020   Procedure: LEFT HEART CATH AND CORONARY ANGIOGRAPHY;  Surgeon: Adrian Prows, MD;  Location: [facility];  Service: Cardiovascular;  Laterality: N/A;   LEFT HEART CATHETERIZATION WITH CORONARY ANGIOGRAM N/A 11/09/2014   Procedure: LEFT HEART CATHETERIZATION WITH CORONARY ANGIOGRAM;  Surgeon: Laverda Page, MD;  Location: [facility] CATH LAB;  Service: Cardiovascular;  Laterality: N/A;   LOWER EXTREMITY ANGIOGRAM Right 11/08/2015   Procedure: Lower Extremity Angiogram;  Surgeon: Serafina Mitchell, MD;  Location: [facility];  Service: Cardiovascular;  Laterality:  Right;   LUMBAR LAMINECTOMY  06/13/2012   Procedure: MICRODISCECTOMY LUMBAR LAMINECTOMY;  Surgeon: Jessy Oto, MD;  Location: [facility];  Service: Orthopedics;  Laterality: N/A;  Central laminectomy L2-3, L3-4, L4-5   PACEMAKER INSERTION  10/08/2014   MDT Advisa MRI compatible dual chamber pacemaker implanted by Dr Caryl Comes for syncope with post-termination pauses   PERCUTANEOUS CORONARY STENT INTERVENTION (PCI-S)  11/09/2014   des to lad & distal circumflex         Dr  [name]   PERIPHERAL VASCULAR BALLOON ANGIOPLASTY Right 11/11/2019   Procedure: PERIPHERAL VASCULAR BALLOON ANGIOPLASTY;  Surgeon: Marty Heck, MD;  Location: [facility];  Service: Cardiovascular;  Laterality: Right;  below knee popliteal, tibioperoneal trunk, posterior tibial arteries   PERIPHERAL VASCULAR CATHETERIZATION N/A 11/08/2015   Procedure: Abdominal Aortogram;  Surgeon: Serafina Mitchell, MD;  Location: [facility];  Service: Cardiovascular;  Laterality: N/A;   PERIPHERAL VASCULAR CATHETERIZATION Right 11/08/2015   Procedure: Peripheral Vascular Atherectomy;  Surgeon: Serafina Mitchell, MD;  Location: [facility];  Service: Cardiovascular;  Laterality: Right;   PERIPHERAL VASCULAR CATHETERIZATION N/A 10/10/2016   Procedure: Lower Extremity Angiography;  Surgeon: Waynetta Sandy, MD;  Location: [facility];  Service: Cardiovascular;  Laterality: N/A;   PERIPHERAL VASCULAR CATHETERIZATION Right 10/10/2016   Procedure: Peripheral Vascular Atherectomy;  Surgeon: Waynetta Sandy, MD;  Location: [facility];  Service: Cardiovascular;  Laterality: Right;  Popliteal   PERMANENT PACEMAKER INSERTION N/A 10/08/2014   Procedure: PERMANENT PACEMAKER INSERTION;  Surgeon: Deboraha Sprang, MD;  Location: [facility] CATH LAB;  Service: Cardiovascular;  Laterality: N/A;   SUBMUCOSAL TATTOO INJECTION  07/20/2020   Procedure: SUBMUCOSAL TATTOO INJECTION;  Surgeon: Irving Copas., MD;   Location: [facility];  Service: Gastroenterology;;   TEMPORARY PACEMAKER INSERTION Bilateral 10/07/2014   Procedure: TEMPORARY PACEMAKER INSERTION;  Surgeon: Laverda Page, MD;  Location: [facility] CATH LAB;  Service: Cardiovascular;  Laterality: Bilateral;   WOUND DEBRIDEMENT Right 02/19/2020   Procedure: DEBRIDEMENT WOUND RIGHT FOOT;  Surgeon: Angelia Mould, MD;  Location: [facility] OR;  Service: Vascular;  Laterality: Right;       Family History  Problem Relation Age of Onset   Diabetes type II Mother    Hypertension Mother    Diabetes Mother    Liver cancer Father    Diabetes type II Sister    Breast cancer Sister    Diabetes Sister    Hypertension Sister    Diabetes type II Brother    Kidney failure Brother    Diabetes Brother    Hypertension Brother    Diabetes type II Sister     Social History   Tobacco Use   Smoking status: Former    Packs/day: 1.50    Years: 30.00    Pack years: 45.00    Types: Cigarettes    Quit date: 10/07/2014    Years since quitting: 7.0   Smokeless tobacco: Never  Vaping Use   Vaping Use: Never used  Substance Use Topics   Alcohol use: Yes    Alcohol/week: 21.0 standard drinks    Types: 21 Glasses of wine per week    Comment: cut way back, none in the last month   Drug use: No    Home Medications Prior to Admission medications   Medication Sig Start Date End Date Taking? Authorizing Provider  ACCU-CHEK FASTCLIX LANCETS MISC Check blood sugar TID & QHS 10/01/14   Lorayne Marek, MD  acetaminophen (TYLENOL) 500 MG tablet Take 1,000 mg by mouth every 6 (six) hours as needed for moderate pain or headache.    [provider]  apixaban (ELIQUIS) 5 MG TABS tablet Take 1 tablet (5 mg total) by mouth 2 (two) times daily. 10/09/21 01/07/22  Adrian Prows, MD  atorvastatin (LIPITOR) 20 MG tablet Take 1 tablet (20 mg total) by mouth at bedtime. 10/01/21   Aline August, MD  blood glucose meter kit and supplies KIT Check blood sugar TID &  QHS 02/10/15   Advani, Vernon Prey, MD  Blood Glucose Monitoring Suppl (ACCU-CHEK ADVANTAGE DIABETES) kit Use as instructed 10/01/14   Lorayne Marek, MD  furosemide (LASIX) 80 MG tablet Take 80 mg by mouth See admin instructions. Take 1 tablet (80 mg) by mouth on (non-dialysis days) Mondays, Wednesdays, Fridays & Sundays in the morning. 02/26/21   [provider]  glucose blood (ACCU-CHEK AVIVA PLUS) test strip Use as instructed 10/01/14   Advani, Vernon Prey, MD  glucose blood (CHOICE DM FORA G20 TEST STRIPS) test strip Check blood sugar TID & QHS 02/10/15   Advani, Vernon Prey, MD  glucose blood test strip Use as instructed 11/20/13   Reyne Dumas, MD  insulin NPH-regular Human (70-30) 100 UNIT/ML injection Inject 60 Units into the skin 2 (two) times daily with a meal. 10/01/21   Aline August, MD  metoprolol tartrate (LOPRESSOR) 25 MG tablet Take 1 tablet (25 mg total) by mouth 2 (two) times daily. 05/24/21 05/19/22  Adrian Prows, MD  Multiple Vitamin (MULTIVITAMIN WITH MINERALS) TABS tablet Take 1 tablet by mouth daily.    [provider]    Allergies    Cytoxan [cyclophosphamide], Keflex [cephalexin], and Pletal [cilostazol]  Review of Systems   Review of Systems  Constitutional:  Negative for chills and fever.  Eyes:  Negative for pain and visual disturbance.  Respiratory:  Positive for cough and shortness of breath.   Cardiovascular:  Negative for chest pain and palpitations.  Gastrointestinal:  Negative for abdominal pain and vomiting.  Musculoskeletal:  Positive for back pain. Negative for myalgias.  Skin:  Negative for color change and rash.  Neurological:  Negative for syncope and headaches.  All other systems reviewed and are negative.  Physical Exam Updated Vital Signs BP (!) 126/111    Pulse (!) 120    Temp 98.8 F (37.1 C) (Oral)    Resp 18    SpO2 98%   Physical Exam Constitutional:      General: He is not in acute distress.    Appearance: He is obese.  HENT:     Head:  Normocephalic and atraumatic.  Eyes:     Conjunctiva/sclera: Conjunctivae normal.     Pupils: Pupils are equal, round, and reactive to light.  Cardiovascular:     Rate and Rhythm: Normal rate and regular rhythm.  Pulmonary:     Effort: Pulmonary effort is normal. No respiratory distress.     Comments: 2L [place] Abdominal:     General: There is no distension.     Tenderness: There is no abdominal tenderness.  Musculoskeletal:     Comments: LE amputation  Skin:    General: Skin is warm and dry.  Neurological:     General: No focal deficit present.     Mental Status: He is alert. Mental status is at baseline.    ED Results / Procedures / Treatments   Labs (all labs ordered are listed, but only abnormal results are displayed) Labs Reviewed  COMPREHENSIVE METABOLIC PANEL - Abnormal; Notable for the following components:      Result Value   Sodium 127 (*)    Chloride 87 (*)    Glucose, Bld 133 (*)    BUN 42 (*)    Creatinine, Ser 4.55 (*)    Calcium 7.9 (*)    Albumin 2.1 (*)    AST 92 (*)    ALT 73 (*)    Total Bilirubin 1.7 (*)    GFR, Estimated 14 (*)    All other components within normal limits  CBC WITH DIFFERENTIAL/PLATELET - Abnormal; Notable for the following components:   WBC 13.0 (*)    RBC 3.29 (*)    Hemoglobin 8.9 (*)    HCT 29.2 (*)    RDW 20.1 (*)    Platelets 97 (*)    Neutro Abs 11.4 (*)    Abs Immature Granulocytes  0.09 (*)    All other components within normal limits  BRAIN NATRIURETIC PEPTIDE - Abnormal; Notable for the following components:   B Natriuretic Peptide 1,181.3 (*)    All other components within normal limits  CBG MONITORING, ED - Abnormal; Notable for the following components:   Glucose-Capillary 110 (*)    All other components within normal limits  TROPONIN I (HIGH SENSITIVITY) - Abnormal; Notable for the following components:   Troponin I (High Sensitivity) 40 (*)    All other components within normal limits  TROPONIN I (HIGH  SENSITIVITY) - Abnormal; Notable for the following components:   Troponin I (High Sensitivity) 43 (*)    All other components within normal limits  RESP PANEL BY RT-PCR (FLU A&B, COVID) [id number]    EKG EKG Interpretation  Date/Time:  Wednesday October 25 2021 18:43:55 EST Ventricular Rate:  131 PR Interval:    QRS Duration: 156 QT Interval:  372 QTC Calculation: 549 R Axis:   -71 Text Interpretation: Atrial fibrillation with rapid ventricular response Right bundle branch block Left anterior fascicular block Confirmed by Octaviano Glow [phone]) on 10/25/2021 9:38:48 PM  Radiology DG Chest 2 View  Result Date: 10/25/2021 CLINICAL DATA:  Shortness of breath EXAM: CHEST - 2 VIEW COMPARISON:  09/30/2021 FINDINGS: Left-sided pacing device as before. Cardiomegaly. Suspicion of moderate right pleural effusion. Hazy airspace disease and consolidation at the right base. Aortic atherosclerosis. No pneumothorax. IMPRESSION: 1. Clearing of previously noted left upper lobe pneumonia 2. New hazy airspace disease and consolidation at right base suspicious for right basilar pneumonia. Suspect that there is moderate right pleural effusion 3. Cardiomegaly Electronically Signed   By: Donavan Foil M.D.   On: 10/25/2021 19:28    Procedures Procedures   Medications Ordered in ED Medications  doxycycline (VIBRA-TABS) tablet 100 mg (100 mg Oral Given 10/25/21 2258)  HYDROmorphone (DILAUDID) injection 0.5 mg (0.5 mg Intravenous Given 10/25/21 2259)  cefTRIAXone (ROCEPHIN) 2 g in sodium chloride 0.9 % 100 mL IVPB (2 g Intravenous New Bag/Given 10/25/21 2259)  ondansetron (ZOFRAN) injection 4 mg (4 mg Intravenous Given 10/25/21 2258)  metoprolol tartrate (LOPRESSOR) injection 5 mg (5 mg Intravenous Given 10/25/21 2258)    ED Course  I have reviewed the triage vital signs and the nursing notes.  Pertinent labs & imaging results that were available during my care of the patient were reviewed by me and  considered in my medical decision making (see chart for details).  Pt is here with concern for PNA, cough, congestion Xray shows likely RML consolidation  IV rocephin, doxycycline ordered ECG and Tele showing A Fib with Rvr, HR 130 at rest, will give 5 mg IV metoprolol  Prior records reviewed May have some CHF component, but overall stable on minimal oxygen - would need dialysis tomorrow as scheduled, but I suspect primary insult is the infection/PNA.  Anticipate admission for antibiotics Delta trop low, flat Covid/flu negative.    Patient stable on room air, 96%.  Admitted to hospitalist.     Final Clinical Impression(s) / ED Diagnoses Final diagnoses:  Atrial fibrillation with RVR ([place])  Community acquired pneumonia of right lung, unspecified part of lung    Rx / DC Orders ED Discharge Orders     None        [place], Carola Rhine, MD 10/25/21 [phone]

## 2021-10-25 NOTE — ED Notes (Signed)
Pt is dialysis Tu, Th, Sat and has not missed any.  Pt has back surgery scheduled but states this back pain is unrelated to the pain he has with that.

## 2021-10-25 NOTE — ED Triage Notes (Signed)
Pt from home via [facility], recently dx pneumonia (11/5), compliant w abx, endorses back pain w deep breaths & laying down. Low 90's SpO2, increased to 97% on 4L  Hx PVD, hypertension, afib

## 2021-10-26 ENCOUNTER — Inpatient Hospital Stay (HOSPITAL_COMMUNITY): Payer: Medicare Other

## 2021-10-26 DIAGNOSIS — I5033 Acute on chronic diastolic (congestive) heart failure: Secondary | ICD-10-CM

## 2021-10-26 DIAGNOSIS — E871 Hypo-osmolality and hyponatremia: Secondary | ICD-10-CM

## 2021-10-26 DIAGNOSIS — I4891 Unspecified atrial fibrillation: Secondary | ICD-10-CM

## 2021-10-26 DIAGNOSIS — I25118 Atherosclerotic heart disease of native coronary artery with other forms of angina pectoris: Secondary | ICD-10-CM

## 2021-10-26 DIAGNOSIS — I739 Peripheral vascular disease, unspecified: Secondary | ICD-10-CM

## 2021-10-26 DIAGNOSIS — D696 Thrombocytopenia, unspecified: Secondary | ICD-10-CM

## 2021-10-26 DIAGNOSIS — I2510 Atherosclerotic heart disease of native coronary artery without angina pectoris: Secondary | ICD-10-CM

## 2021-10-26 DIAGNOSIS — E1122 Type 2 diabetes mellitus with diabetic chronic kidney disease: Secondary | ICD-10-CM

## 2021-10-26 LAB — CBG MONITORING, ED
Glucose-Capillary: 108 mg/dL — ABNORMAL HIGH (ref 70–99)
Glucose-Capillary: 155 mg/dL — ABNORMAL HIGH (ref 70–99)

## 2021-10-26 LAB — HEPATITIS B SURFACE ANTIGEN: Hepatitis B Surface Ag: NONREACTIVE

## 2021-10-26 LAB — CBC
HCT: 23.3 % — ABNORMAL LOW (ref 39.0–52.0)
Hemoglobin: 7.1 g/dL — ABNORMAL LOW (ref 13.0–17.0)
MCH: 26.8 pg (ref 26.0–34.0)
MCHC: 30.5 g/dL (ref 30.0–36.0)
MCV: 87.9 fL (ref 80.0–100.0)
Platelets: 91 10*3/uL — ABNORMAL LOW (ref 150–400)
RBC: 2.65 MIL/uL — ABNORMAL LOW (ref 4.22–5.81)
RDW: 20.1 % — ABNORMAL HIGH (ref 11.5–15.5)
WBC: 10.8 10*3/uL — ABNORMAL HIGH (ref 4.0–10.5)
nRBC: 0 % (ref 0.0–0.2)

## 2021-10-26 LAB — HEPATITIS B SURFACE ANTIBODY,QUALITATIVE: Hep B S Ab: REACTIVE — AB

## 2021-10-26 LAB — BASIC METABOLIC PANEL
Anion gap: 11 (ref 5–15)
BUN: 36 mg/dL — ABNORMAL HIGH (ref 8–23)
CO2: 27 mmol/L (ref 22–32)
Calcium: 7.3 mg/dL — ABNORMAL LOW (ref 8.9–10.3)
Chloride: 92 mmol/L — ABNORMAL LOW (ref 98–111)
Creatinine, Ser: 3.65 mg/dL — ABNORMAL HIGH (ref 0.61–1.24)
GFR, Estimated: 18 mL/min — ABNORMAL LOW (ref >60)
Glucose, Bld: 148 mg/dL — ABNORMAL HIGH (ref 70–99)
Potassium: 3.6 mmol/L (ref 3.5–5.1)
Sodium: 130 mmol/L — ABNORMAL LOW (ref 135–145)

## 2021-10-26 LAB — PHOSPHORUS: Phosphorus: 4.3 mg/dL (ref 2.5–4.6)

## 2021-10-26 LAB — GLUCOSE, CAPILLARY: Glucose-Capillary: 199 mg/dL — ABNORMAL HIGH (ref 70–99)

## 2021-10-26 MED ORDER — NEPRO/CARBSTEADY PO LIQD
237.0000 mL | Freq: Two times a day (BID) | ORAL | Status: DC
  Administered 2021-10-27 – 2021-11-02 (×4): 237 mL via ORAL

## 2021-10-26 MED ORDER — METOPROLOL TARTRATE 50 MG PO TABS
50.0000 mg | ORAL_TABLET | Freq: Two times a day (BID) | ORAL | Status: DC
  Administered 2021-10-26 – 2021-11-03 (×14): 50 mg via ORAL
  Filled 2021-10-26 (×17): qty 1

## 2021-10-26 MED ORDER — INSULIN ASPART 100 UNIT/ML IJ SOLN: 40.0000 [IU] | Freq: Two times a day (BID) | INTRAMUSCULAR | Status: DC

## 2021-10-26 MED ORDER — CHLORHEXIDINE GLUCONATE CLOTH 2 % EX PADS: 6.0000 | MEDICATED_PAD | Freq: Every day | CUTANEOUS | Status: DC

## 2021-10-26 MED ORDER — INSULIN ASPART 100 UNIT/ML IJ SOLN
0.0000 [IU] | Freq: Three times a day (TID) | INTRAMUSCULAR | Status: DC
  Administered 2021-10-26: 2 [IU] via SUBCUTANEOUS
  Administered 2021-10-27: 3 [IU] via SUBCUTANEOUS
  Administered 2021-10-27: 2 [IU] via SUBCUTANEOUS
  Administered 2021-10-28 (×2): 1 [IU] via SUBCUTANEOUS

## 2021-10-26 MED ORDER — DILTIAZEM HCL ER COATED BEADS 120 MG PO CP24
120.0000 mg | ORAL_CAPSULE | Freq: Every day | ORAL | Status: DC
  Administered 2021-10-26 – 2021-11-06 (×10): 120 mg via ORAL
  Filled 2021-10-26 (×13): qty 1

## 2021-10-26 MED ORDER — DARBEPOETIN ALFA 200 MCG/0.4ML IJ SOSY
200.0000 ug | PREFILLED_SYRINGE | INTRAMUSCULAR | Status: DC
  Administered 2021-10-26: 200 ug via INTRAVENOUS
  Filled 2021-10-26 (×2): qty 0.4

## 2021-10-26 MED ORDER — PERFLUTREN LIPID MICROSPHERE
1.0000 mL | INTRAVENOUS | Status: AC | PRN
Start: 2021-10-26 — End: 2021-10-26
  Administered 2021-10-26: 2 mL via INTRAVENOUS
  Filled 2021-10-26: qty 10

## 2021-10-26 MED ORDER — ENSURE ENLIVE PO LIQD
237.0000 mL | Freq: Two times a day (BID) | ORAL | Status: DC
  Administered 2021-10-27: 237 mL via ORAL

## 2021-10-26 MED ORDER — ALBUMIN HUMAN 25 % IV SOLN: 25.0000 g | Freq: Once | INTRAVENOUS | Status: DC

## 2021-10-26 MED ORDER — APIXABAN 5 MG PO TABS
5.0000 mg | ORAL_TABLET | Freq: Two times a day (BID) | ORAL | Status: DC
  Administered 2021-10-26 – 2021-10-31 (×12): 5 mg via ORAL
  Filled 2021-10-26 (×12): qty 1

## 2021-10-26 MED ORDER — ORAL CARE MOUTH RINSE
15.0000 mL | Freq: Two times a day (BID) | OROMUCOSAL | Status: DC
  Administered 2021-10-27 – 2021-11-05 (×17): 15 mL via OROMUCOSAL

## 2021-10-26 MED ORDER — INSULIN ASPART PROT & ASPART (70-30 MIX) 100 UNIT/ML ~~LOC~~ SUSP
40.0000 [IU] | Freq: Two times a day (BID) | SUBCUTANEOUS | Status: DC
  Administered 2021-10-27 – 2021-10-28 (×3): 40 [IU] via SUBCUTANEOUS
  Filled 2021-10-26: qty 10

## 2021-10-26 MED ORDER — ATORVASTATIN CALCIUM 10 MG PO TABS
20.0000 mg | ORAL_TABLET | Freq: Every day | ORAL | Status: DC
  Administered 2021-10-26 – 2021-10-28 (×3): 20 mg via ORAL
  Filled 2021-10-26 (×3): qty 2

## 2021-10-26 MED ORDER — SODIUM CHLORIDE 0.9 % IV SOLN: 1.0000 g | INTRAVENOUS | Status: DC

## 2021-10-26 MED ORDER — METOPROLOL TARTRATE 25 MG PO TABS
25.0000 mg | ORAL_TABLET | Freq: Two times a day (BID) | ORAL | Status: DC
  Administered 2021-10-26: 25 mg via ORAL
  Filled 2021-10-26: qty 1

## 2021-10-26 MED ORDER — CALCITRIOL 0.5 MCG PO CAPS
0.5000 ug | ORAL_CAPSULE | ORAL | Status: DC
  Administered 2021-10-26 – 2021-11-04 (×4): 0.5 ug via ORAL
  Filled 2021-10-26 (×7): qty 1

## 2021-10-26 MED ORDER — ACETAMINOPHEN 325 MG PO TABS: 650.0000 mg | ORAL_TABLET | ORAL | Status: DC | PRN

## 2021-10-26 MED ORDER — ALBUMIN HUMAN 25 % IV SOLN
INTRAVENOUS | Status: AC
  Filled 2021-10-26: qty 100

## 2021-10-26 MED ORDER — DILTIAZEM HCL 25 MG/5ML IV SOLN
10.0000 mg | Freq: Once | INTRAVENOUS | Status: AC
  Administered 2021-10-26: 10 mg via INTRAVENOUS
  Filled 2021-10-26: qty 5

## 2021-10-26 MED ORDER — FUROSEMIDE 40 MG PO TABS
80.0000 mg | ORAL_TABLET | ORAL | Status: DC
  Administered 2021-10-27 – 2021-10-29 (×2): 80 mg via ORAL
  Filled 2021-10-26 (×2): qty 2

## 2021-10-26 MED ORDER — OXYCODONE-ACETAMINOPHEN 5-325 MG PO TABS
1.0000 | ORAL_TABLET | ORAL | Status: DC | PRN
  Administered 2021-10-26 – 2021-10-30 (×6): 2 via ORAL
  Administered 2021-10-31 – 2021-11-01 (×2): 1 via ORAL
  Administered 2021-11-01 – 2021-11-04 (×7): 2 via ORAL
  Administered 2021-11-05 (×3): 1 via ORAL
  Administered 2021-11-06 (×2): 2 via ORAL
  Filled 2021-10-26: qty 1
  Filled 2021-10-26: qty 2
  Filled 2021-10-26: qty 1
  Filled 2021-10-26: qty 2
  Filled 2021-10-26: qty 1
  Filled 2021-10-26: qty 2
  Filled 2021-10-26: qty 1
  Filled 2021-10-26 (×2): qty 2
  Filled 2021-10-26: qty 1
  Filled 2021-10-26 (×10): qty 2

## 2021-10-26 NOTE — Progress Notes (Signed)
Goal lowered to 2.5 for low BP

## 2021-10-26 NOTE — ED Notes (Signed)
Pt now in afib RVR. MD at the bedside.

## 2021-10-26 NOTE — Progress Notes (Signed)
Pt receives out-pt HD at [facility] on [address]. Pt arrives at 5:10 for 5:30 chair time. Will assist as needed.  Melven Sartorius Renal Navigator [phone]

## 2021-10-26 NOTE — H&P (Signed)
History and Physical    Alexander [name] HUT:[id number] DOB: July 11, 1961 DOA: 10/25/2021  PCP: [name], MD  Patient coming from: Home  I have personally briefly reviewed patient's old medical records in [place]  Chief Complaint: increasing shortness of breath  HPI: Alexander [name] is a 60 y.o. male with medical history significant for ESRD on HD TTS, paroxysmal atrial fibrillation on Eliquis, pacemaker, CAD s/p stent, diastolic CHF, peripheral vascular disease with right BKA, history of GI bleed, type 2 diabetes who presents with concerns of increasing shortness of breath and cough.  Patient reports being diagnosed in early November with pneumonia and was treated with a course of antibiotics.  However felt like he has not been back to baseline since.  Has had persistent shortness of breath and coughing.  Feels fatigued.  Notes some anterior and right lateral sided rib pain worse with coughing.  Has worsening left lower extremity edema.  Reports compliance with dialysis with last session yesterday.  ED Course: He was afebrile, found to be in atrial fibrillation with RVR with initial rate up to 140.  He was hypoxic down to 80% and put on 2 L. WBC of 13, hemoglobin of 8.9 with baseline around 9, platelet of 97.  Sodium of 127, K of 3.8, chloride of 87, CO2 25, creatinine of 4.55 from a prior 1.79, BG of 133 BMP of 1181.  Troponin of 43 with increased to 40.  Chest x-ray showing right basilar pneumonia with resolved left upper lobe pneumonia.  He was started on IV Rocephin and doxycycline for pneumonia.  Also given one-time dose of IV 5 mg Lopressor with improvement in sinus heart rate in the 110s. Hospitalist was then called for admission.  Review of Systems: Constitutional:  No Fever ENT/Mouth: No sore throat, No Rhinorrhea Eyes: No Vision Changes Cardiovascular: + Chest Pain,+SOB, No PND,+ Dyspnea on Exertion, No Orthopnea, No Claudication, +Edema, No  Palpitations Respiratory: + Cough, No Sputum Gastrointestinal: No Nausea, No Vomiting, No Diarrhea, No Constipation, No Pain Genitourinary: no Urinary Incontinence Musculoskeletal: No Arthralgias, No Myalgias Skin: No Skin Lesions, No Pruritus, Neuro: no Weakness, No Numbness Psych: No Anxiety/Panic, No Depression, no decrease appetite Heme/Lymph: No Bruising, No Bleeding  Past Medical History:  Diagnosis Date   Alcohol abuse    Anemia    Arthritis    "patient does not think so."   Atrial fibrillation ([place])    Cardiac syncope 10/07/2014   rapid A fib with 8 sec pauses on converison with syncope- temp pacing wire placed then PPM   Cataract    BILATERAL   Coronary artery disease    Diabetes mellitus    dx---been  awhile   ESRD (end stage renal disease) ([place])    [place]   GERD (gastroesophageal reflux disease)    History of blood transfusion    Hyperlipemia    Hypertension    Osteomyelitis (HCC)    right foot   Peripheral vascular disease (HCC)    Presence of permanent cardiac pacemaker 10/08/2014   [employer]    Past Surgical History:  Procedure Laterality Date   A/V FISTULAGRAM Right 05/01/2021   Procedure: A/V FISTULAGRAM;  Surgeon: Waynetta Sandy, MD;  Location: [place] [facility];  Service: Cardiovascular;  Laterality: Right;  failed fistulagram   ABDOMINAL AORTOGRAM W/LOWER EXTREMITY N/A 09/09/2017   Procedure: ABDOMINAL AORTOGRAM W/LOWER EXTREMITY;  Surgeon: Angelia Mould, MD;  Location: [place] [facility];  Service: Cardiovascular;  Laterality: N/A;   ABDOMINAL AORTOGRAM W/LOWER EXTREMITY Right 11/11/2019   Procedure: ABDOMINAL AORTOGRAM W/LOWER EXTREMITY;  Surgeon: Marty Heck, MD;  Location: [place] [facility];  Service: Cardiovascular;  Laterality: Right;   AMPUTATION Left 08/19/2013   Procedure: AMPUTATION RAY;  Surgeon: Alta Corning, MD;  Location: [place];  Service: Orthopedics;  Laterality: Left;  ray amputation  left 5th   AMPUTATION Left 10/27/2013   Procedure: AMPUTATION DIGIT-LEFT 4TH TOE, 4th and 5th metatarsal.;  Surgeon: Angelia Mould, MD;  Location: [place];  Service: Vascular;  Laterality: Left;   AMPUTATION Left 11/04/2013   Procedure: LEFT FOOT TRANS-METATARSAL AMPUTATION WITH WOUND CLOSURE ;  Surgeon: Alta Corning, MD;  Location: [place];  Service: Orthopedics;  Laterality: Left;   AMPUTATION Right 09/10/2017   Procedure: RIGHT FOURTH AND FIFTH TOE AMPUTATION;  Surgeon: Angelia Mould, MD;  Location: [place];  Service: Vascular;  Laterality: Right;   AMPUTATION Right 02/19/2020   Procedure: AMPUTATION RIGHT 3RD TOE;  Surgeon: Angelia Mould, MD;  Location: [place];  Service: Vascular;  Laterality: Right;   AMPUTATION Right 03/11/2020   Procedure: RIGHT BELOW KNEE AMPUTATION;  Surgeon: Newt Minion, MD;  Location: [place];  Service: Orthopedics;  Laterality: Right;   APPLICATION OF WOUND VAC Right 02/19/2020   Procedure: Application Of Wound Vac Right foot;  Surgeon: Angelia Mould, MD;  Location: [place];  Service: Vascular;  Laterality: Right;   AV FISTULA PLACEMENT Right 03/27/2021   Procedure: RIGHT ARM ARTERIOVENOUS (AV) FISTULA;  Surgeon: Angelia Mould, MD;  Location: [place];  Service: Vascular;  Laterality: Right;   AV FISTULA PLACEMENT Right 05/10/2021   Procedure: RIGHT ARM ARTERIOVENOUS (AV) FISTULA;  Surgeon: Waynetta Sandy, MD;  Location: [place];  Service: Vascular;  Laterality: Right;   [place] Right 08/11/2021   Procedure: RIGHT ARM SECOND STAGE BASILIC VEIN TRANSPOSITION;  Surgeon: Waynetta Sandy, MD;  Location: [place];  Service: Vascular;  Laterality: Right;   BIOPSY  07/20/2020   Procedure: BIOPSY;  Surgeon: Irving Copas., MD;  Location: [place];  Service: Gastroenterology;;   CARDIAC CATHETERIZATION  10/07/2014   Procedure: LEFT HEART CATH AND CORONARY ANGIOGRAPHY;   Surgeon: Laverda Page, MD;  Location: [facility];  Service: Cardiovascular;;   COLONOSCOPY W/ POLYPECTOMY     COLONOSCOPY WITH PROPOFOL N/A 05/25/2020   Procedure: COLONOSCOPY WITH PROPOFOL;  Surgeon: Gatha Mayer, MD;  Location: [facility];  Service: Endoscopy;  Laterality: N/A;   EMBOLECTOMY Right 09/12/2017   Procedure: Thrombectomy  & Redo Right Below Knee Popliteal Artery Bypass Graft  ;  Surgeon: Waynetta Sandy, MD;  Location: [facility] OR;  Service: Vascular;  Laterality: Right;   ENTEROSCOPY N/A 07/20/2020   Procedure: ENTEROSCOPY;  Surgeon: Rush Landmark Telford Nab., MD;  Location: [place];  Service: Gastroenterology;  Laterality: N/A;   ESOPHAGOGASTRODUODENOSCOPY (EGD) WITH PROPOFOL N/A 05/24/2020   Procedure: ESOPHAGOGASTRODUODENOSCOPY (EGD) WITH PROPOFOL;  Surgeon: Ladene Artist, MD;  Location: [facility];  Service: Endoscopy;  Laterality: N/A;   EYE SURGERY Bilateral    cataract   FEMORAL-TIBIAL BYPASS GRAFT Left 10/27/2013   Procedure: BYPASS GRAFT LEFT FEMORAL- POSTERIOR TIBIAL ARTERY;  Surgeon: Angelia Mould, MD;  Location: [place];  Service: Vascular;  Laterality: Left;   FEMORAL-TIBIAL BYPASS GRAFT Right 09/10/2017   Procedure: RIGHT SUPERFICIAL  FEMORAL ARTERY-BELOW KNEE POPLITEAL ARTERY BYPASS GRAFT WITH VEIN;  Surgeon: [name]  S, MD;  Location: [place];  Service: Vascular;  Laterality: Right;   GIVENS CAPSULE STUDY N/A 07/20/2020   Procedure: GIVENS CAPSULE STUDY;  Surgeon: Irving Copas., MD;  Location: [place];  Service: Gastroenterology;  Laterality: N/A;  capsule placed through scope into duodenum @ 0936   I & D EXTREMITY Left 10/27/2013   Procedure: IRRIGATION AND DEBRIDEMENT EXTREMITY- LEFT FOOT;  Surgeon: Angelia Mould, MD;  Location: [place];  Service: Vascular;  Laterality: Left;   I & D EXTREMITY Right 01/26/2020   Procedure: IRRIGATION AND DEBRIDEMENT EXTREMITY RIGHT FOOT WOUND;  Surgeon: Angelia Mould, MD;  Location: [place];  Service: Vascular;  Laterality: Right;   INSERT / REPLACE / REMOVE PACEMAKER     INTRAOPERATIVE ARTERIOGRAM Right 09/10/2017   Procedure: INTRA OPERATIVE ARTERIOGRAM;  Surgeon: Angelia Mould, MD;  Location: [place];  Service: Vascular;  Laterality: Right;   IR ANGIOGRAM FOLLOW UP STUDY  09/09/2017   IR FLUORO GUIDE CV LINE RIGHT  05/31/2020   IR US GUIDE VASC ACCESS RIGHT  06/01/2020   LEFT HEART CATH AND CORONARY ANGIOGRAPHY N/A 08/19/2020   Procedure: LEFT HEART CATH AND CORONARY ANGIOGRAPHY;  Surgeon: Adrian Prows, MD;  Location: [place] [facility];  Service: Cardiovascular;  Laterality: N/A;   LEFT HEART CATHETERIZATION WITH CORONARY ANGIOGRAM N/A 11/09/2014   Procedure: LEFT HEART CATHETERIZATION WITH CORONARY ANGIOGRAM;  Surgeon: Laverda Page, MD;  Location: [facility] CATH LAB;  Service: Cardiovascular;  Laterality: N/A;   LOWER EXTREMITY ANGIOGRAM Right 11/08/2015   Procedure: Lower Extremity Angiogram;  Surgeon: Serafina Mitchell, MD;  Location: [place] [facility];  Service: Cardiovascular;  Laterality: Right;   LUMBAR LAMINECTOMY  06/13/2012   Procedure: MICRODISCECTOMY LUMBAR LAMINECTOMY;  Surgeon: Jessy Oto, MD;  Location: [place];  Service: Orthopedics;  Laterality: N/A;  Central laminectomy L2-3, L3-4, L4-5   PACEMAKER INSERTION  10/08/2014   MDT Advisa MRI compatible dual chamber pacemaker implanted by Dr Caryl Comes for syncope with post-termination pauses   PERCUTANEOUS CORONARY STENT INTERVENTION (PCI-S)  11/09/2014   des to lad & distal circumflex         Dr  [name]   PERIPHERAL VASCULAR BALLOON ANGIOPLASTY Right 11/11/2019   Procedure: PERIPHERAL VASCULAR BALLOON ANGIOPLASTY;  Surgeon: Marty Heck, MD;  Location: [place] [facility];  Service: Cardiovascular;  Laterality: Right;  below knee popliteal, tibioperoneal trunk, posterior tibial arteries   PERIPHERAL VASCULAR CATHETERIZATION N/A 11/08/2015   Procedure: Abdominal Aortogram;   Surgeon: Serafina Mitchell, MD;  Location: [place] [facility];  Service: Cardiovascular;  Laterality: N/A;   PERIPHERAL VASCULAR CATHETERIZATION Right 11/08/2015   Procedure: Peripheral Vascular Atherectomy;  Surgeon: Serafina Mitchell, MD;  Location: [place] [facility];  Service: Cardiovascular;  Laterality: Right;   PERIPHERAL VASCULAR CATHETERIZATION N/A 10/10/2016   Procedure: Lower Extremity Angiography;  Surgeon: Waynetta Sandy, MD;  Location: [place] [facility];  Service: Cardiovascular;  Laterality: N/A;   PERIPHERAL VASCULAR CATHETERIZATION Right 10/10/2016   Procedure: Peripheral Vascular Atherectomy;  Surgeon: Waynetta Sandy, MD;  Location: [place] [facility];  Service: Cardiovascular;  Laterality: Right;  Popliteal   PERMANENT PACEMAKER INSERTION N/A 10/08/2014   Procedure: PERMANENT PACEMAKER INSERTION;  Surgeon: Deboraha Sprang, MD;  Location: [facility];  Service: Cardiovascular;  Laterality: N/A;   SUBMUCOSAL TATTOO INJECTION  07/20/2020   Procedure: SUBMUCOSAL TATTOO INJECTION;  Surgeon: Irving Copas., MD;  Location: [place];  Service: Gastroenterology;;  TEMPORARY PACEMAKER INSERTION Bilateral 10/07/2014   Procedure: TEMPORARY PACEMAKER INSERTION;  Surgeon: Laverda Page, MD;  [facility];  Service: Cardiovascular;  Laterality: Bilateral;   WOUND DEBRIDEMENT Right 02/19/2020   Procedure: DEBRIDEMENT WOUND RIGHT FOOT;  Surgeon: Angelia Mould, MD;  Location: [place];  Service: Vascular;  Laterality: Right;     reports that he quit smoking about 7 years ago. His smoking use included cigarettes. He has a 45.00 pack-year smoking history. He has never used smokeless tobacco. He reports current alcohol use of about 21.0 standard drinks per week. He reports that he does not use drugs. Social History  Allergies  Allergen Reactions   Cytoxan [Cyclophosphamide]     pancytopenia   Keflex [Cephalexin] Nausea And Vomiting   Pletal  [Cilostazol] Palpitations    "Can hear heart beating loudly".    Family History  Problem Relation Age of Onset   Diabetes type II Mother    Hypertension Mother    Diabetes Mother    Liver cancer Father    Diabetes type II Sister    Breast cancer Sister    Diabetes Sister    Hypertension Sister    Diabetes type II Brother    Kidney failure Brother    Diabetes Brother    Hypertension Brother    Diabetes type II Sister      Prior to Admission medications   Medication Sig Start Date End Date Taking? Authorizing Provider  ACCU-CHEK FASTCLIX LANCETS MISC Check blood sugar TID & QHS 10/01/14   Lorayne Marek, MD  acetaminophen (TYLENOL) 500 MG tablet Take 1,000 mg by mouth every 6 (six) hours as needed for moderate pain or headache.    [provider]  apixaban (ELIQUIS) 5 MG TABS tablet Take 1 tablet (5 mg total) by mouth 2 (two) times daily. 10/09/21 01/07/22  Adrian Prows, MD  atorvastatin (LIPITOR) 20 MG tablet Take 1 tablet (20 mg total) by mouth at bedtime. 10/01/21   Aline August, MD  blood glucose meter kit and supplies KIT Check blood sugar TID & QHS 02/10/15   Advani, Vernon Prey, MD  Blood Glucose Monitoring Suppl (ACCU-CHEK ADVANTAGE DIABETES) kit Use as instructed 10/01/14   Lorayne Marek, MD  furosemide (LASIX) 80 MG tablet Take 80 mg by mouth See admin instructions. Take 1 tablet (80 mg) by mouth on (non-dialysis days) Mondays, Wednesdays, Fridays & Sundays in the morning. 02/26/21   [provider]  glucose blood (ACCU-CHEK AVIVA PLUS) test strip Use as instructed 10/01/14   Advani, Vernon Prey, MD  glucose blood (CHOICE DM FORA G20 TEST STRIPS) test strip Check blood sugar TID & QHS 02/10/15   Advani, Vernon Prey, MD  glucose blood test strip Use as instructed 11/20/13   Reyne Dumas, MD  insulin NPH-regular Human (70-30) 100 UNIT/ML injection Inject 60 Units into the skin 2 (two) times daily with a meal. 10/01/21   Aline August, MD  metoprolol tartrate (LOPRESSOR) 25 MG  tablet Take 1 tablet (25 mg total) by mouth 2 (two) times daily. 05/24/21 05/19/22  Adrian Prows, MD  Multiple Vitamin (MULTIVITAMIN WITH MINERALS) TABS tablet Take 1 tablet by mouth daily.    [provider]    Physical Exam: Vitals:   10/25/21 2245 10/25/21 2300 10/26/21 0000 10/26/21 0030  BP:      Pulse: (!) 115 (!) 121 (!) 110 (!) 118  Resp: (!) 23 20 (!) 26 17  Temp:      TempSrc:      SpO2:  94% 98% 93% 94%    Constitutional: NAD, calm, comfortable, elderly male laying flat in bed  Vitals:   10/25/21 2245 10/25/21 2300 10/26/21 0000 10/26/21 0030  BP:      Pulse: (!) 115 (!) 121 (!) 110 (!) 118  Resp: (!) 23 20 (!) 26 17  Temp:      TempSrc:      SpO2: 94% 98% 93% 94%   Eyes: lids and conjunctivae normal ENMT: Mucous membranes are moist.  Neck: normal, supple Respiratory: clear to auscultation bilaterally, no wheezing, no crackles. Normal respiratory effort. Cardiovascular: Irregularly irregular rhythm, no murmurs / rubs / gallops. +3 pitting edema of the left lower extremity up to knee. Abdomen: no tenderness, no masses palpated.  Bowel sounds positive.  Musculoskeletal: no clubbing / cyanosis.  Right BKA.  Skin: no rashes, lesions, ulcers. No induration Neurologic: CN 2-12 grossly intact.  Strength 5/5 in all 4.  Psychiatric: Normal judgment and insight. Alert and oriented x 3. Normal mood.    Labs on Admission: I have personally reviewed following labs and imaging studies  CBC: Recent Labs  Lab 10/25/21 1828  WBC 13.0*  NEUTROABS 11.4*  HGB 8.9*  HCT 29.2*  MCV 88.8  PLT 97*   Basic Metabolic Panel: Recent Labs  Lab 10/25/21 1828  NA 127*  K 3.8  CL 87*  CO2 25  GLUCOSE 133*  BUN 42*  CREATININE 4.55*  CALCIUM 7.9*   GFR: CrCl cannot be calculated (Unknown ideal weight.). Liver Function Tests: Recent Labs  Lab 10/25/21 1828  AST 92*  ALT 73*  ALKPHOS 82  BILITOT 1.7*  PROT 6.5  ALBUMIN 2.1*   No results for input(s): LIPASE,  AMYLASE in the last 168 hours. No results for input(s): AMMONIA in the last 168 hours. Coagulation Profile: No results for input(s): INR, PROTIME in the last 168 hours. Cardiac Enzymes: No results for input(s): CKTOTAL, CKMB, CKMBINDEX, TROPONINI in the last 168 hours. BNP (last 3 results) No results for input(s): PROBNP in the last 8760 hours. HbA1C: No results for input(s): HGBA1C in the last 72 hours. CBG: Recent Labs  Lab 10/25/21 2021  GLUCAP 110*   Lipid Profile: No results for input(s): CHOL, HDL, LDLCALC, TRIG, CHOLHDL, LDLDIRECT in the last 72 hours. Thyroid Function Tests: No results for input(s): TSH, T4TOTAL, FREET4, T3FREE, THYROIDAB in the last 72 hours. Anemia Panel: No results for input(s): VITAMINB12, FOLATE, FERRITIN, TIBC, IRON, RETICCTPCT in the last 72 hours. Urine analysis:    Component Value Date/Time   COLORURINE YELLOW 05/23/2020 2057   APPEARANCEUR CLEAR 05/23/2020 2057   LABSPEC 1.008 05/23/2020 2057   PHURINE 5.0 05/23/2020 2057   GLUCOSEU 50 (A) 05/23/2020 2057   HGBUR LARGE (A) 05/23/2020 2057   BILIRUBINUR NEGATIVE 05/23/2020 2057   BILIRUBINUR neg 10/01/2014 1445   KETONESUR NEGATIVE 05/23/2020 2057   PROTEINUR 100 (A) 05/23/2020 2057   UROBILINOGEN 0.2 10/01/2014 1445   UROBILINOGEN 0.2 09/30/2014 2051   NITRITE NEGATIVE 05/23/2020 2057   LEUKOCYTESUR NEGATIVE 05/23/2020 2057    Radiological Exams on Admission: DG Chest 2 View  Result Date: 10/25/2021 CLINICAL DATA:  Shortness of breath EXAM: CHEST - 2 VIEW COMPARISON:  09/30/2021 FINDINGS: Left-sided pacing device as before. Cardiomegaly. Suspicion of moderate right pleural effusion. Hazy airspace disease and consolidation at the right base. Aortic atherosclerosis. No pneumothorax. IMPRESSION: 1. Clearing of previously noted left upper lobe pneumonia 2. New hazy airspace disease and consolidation at right base suspicious for right basilar pneumonia. Suspect  that there is moderate right  pleural effusion 3. Cardiomegaly Electronically Signed   By: Donavan Foil M.D.   On: 10/25/2021 19:28      Assessment/Plan Acute hypoxic respiratory failure secondary to community-acquired pneumonia admitted on 2L. CXR showing RLL pneumonia -continue IV Rocephin and doxycycline  Paroxysmal atrial fibrillation with RVR -likely due to ongoing infection -Rate now controlled with one-time IV Lopressor 5 mg dose in the ED -Continue home beta-blocker -Continue Eliquis  acute on chronic diastolic CHF suspect due to A.fib with RVR and ESRD  pt still able to make some urine  continue 72m Lasix on non-dialysis days  Follow daily weight, intake and output - Last echocardiogram on 05/2020 with EF of 60-65% and grade 2 diastolic dysfunction without any valvular dysfunction. Obtain repeat.   Thrombocytopenia -Plt of 97. likely reactive.  Follow with repeat labs in the morning  Hyponatremia -suspect due to fluid overload. Anticipate improvement with HD tomorrow  ESRD on HD TTS -last session yesterday. Needs nephro consult for HD tomorrow  -creatinine elevated at 4.55 and has some fluid overload   Type 2 diabetes Hemoglobin A1c on 09/16/2021 of 7.8 Home regimen on Novolin 70/30 60U BID with meals -start with Novolog 40 units BID with meals and sensitive SSI   CAD s/p stent Pacemaker Stable.  Asymptomatic continue metoprolol and Lipitor  Peripheral vascular disease Has right BKA  DVT prophylaxis:.Eliquis  code Status: Full Family Communication: Plan discussed with patient at bedside  disposition Plan: Home with at least 2 midnight stays  Consults called:  Admission status: inpatient  Level of care: Telemetry Cardiac  Status is: Inpatient  Remains inpatient appropriate because: Acute hypoxia requiring supplemental oxygen and atrial fibrillation with RVR requiring close telemetry monitoring         Alyss Granato T Tarynn Garling DO [facility]   If 7PM-7AM, please contact  night-coverage [email or web address]   10/26/2021, 1:09 AM

## 2021-10-26 NOTE — Progress Notes (Signed)
Checked needle position w no improvement

## 2021-10-26 NOTE — Procedures (Signed)
° °  I was present at this dialysis session, have reviewed the session itself and made  appropriate changes Kelly Splinter MD [place] pager [phone]   10/26/2021, 2:58 PM

## 2021-10-26 NOTE — Progress Notes (Addendum)
[facility]                                                                              Patient Demographics  Alexander [name], is a 60 y.o. male, DOB - 07-11-1961, WHQ:[id number]  Admit date - 10/25/2021   Admitting Physician Orene Desanctis, DO  Outpatient Primary MD for the patient is Tisovec, [name], MD  Outpatient specialists:   LOS - 1  days   Medical records reviewed and are as summarized below:    Chief Complaint  Patient presents with   Shortness of Breath   Back Pain       Brief summary   Patient is a 60 year old male with ESRD on HD, TTS, paroxysmal A. fib on Eliquis, pacemaker, CAD status post stent, diastolic CHF, PVD with right BKA, prior history of GI bleed, diabetes mellitus type 2 presented with increasing shortness of breath and cough.  Per patient he was diagnosed with pneumonia in early November and completed course of antibiotics.  However he has not felt back to baseline since.  Has had persistent shortness of breath and coughing, fatigue.  Also noted some anterior and right lateral sided rib pain worse with coughing and worsening left lower extremity edema.  He did complete his HD last session prior to admission. In ED, patient was hypoxic down to 80%, was put on 2 L BNP 1000 181.  Chest x-ray showed right basilar pneumonia, he was placed on IV Rocephin and doxycycline.   Assessment & Plan    Principal Problem: Acute respiratory failure with hypoxia, multifactorial, acute on chronic diastolic CHF, community-acquired pneumonia, paroxysmal A. fib with RVR ([place]) -Patient was hypoxic in ED, sats 80%, placed on 2 L O2 -Chest x-ray showed clearing of prior LUL pneumonia, new hazy airspace disease and consolidation at the right base suspicion for right basilar pneumonia, moderate right pleural effusion, cardiomegaly -Currently sats 100% on 2 L, wean O2 as tolerated, needs HD for volume overload  Active Problems: Acute on chronic diastolic CHF  (congestive heart failure) ([place]) -Suspect worsened due to pneumonia and A. fib with RVR, has ESRD on HD -2D echo on 05/2020 had shown EF of 60 to 65% with G2 DD, will repeat echo -Nephrology consulted for hemodialysis for volume removal.  Patient reports that he takes Lasix 80 mg on nondialysis days.  Paroxysmal atrial fibrillation with RVR -Heart rate 120s to 140s, A. fib with RVR during my encounter - will give Cardizem 10 mg IV x1, resume home dose of Lopressor -Cardiology consulted, patient follows Dr. [name] -Continue eliquis  Right lung pneumonia -Chest x-ray showed consolidation at the right base, continue doxycycline, IV Rocephin  ESRD on hemodialysis, TTS -Patient completed last session prior to admission, needs hemodialysis today per his schedule -Nephrology consulted    Diabetes mellitus type II, IDDM with end stage renal disease ([place]) -Hemoglobin A1c 7.8 on 11/5 -Continue home dose of insulin    PVD (peripheral vascular disease) ([place]), -Has history of right BKA   CAD (coronary artery disease), native coronary artery -Currently no acute chest pain, continue metoprolol, Lipitor, eliquis    Pacemaker: Medtronic Advisa DR  MRI [id number] Dual chamber pacemaker 10/08/2014  Anemia with thrombocytopenia ([facility]) -Platelets 97K, follow counts closely -Hemoglobin appears to be close to baseline    Hyponatremia -Possibly due to fluid overload, continue current management, will repeat BMET in a.m. after HD  Obesity Estimated body mass index is 33.57 kg/m as calculated from the following:   Height as of 09/30/21: 6' (1.829 m).   Weight as of 10/01/21: 112.3 kg.  Code Status: Full CODE STATUS DVT Prophylaxis:   apixaban (ELIQUIS) tablet 5 mg   Level of Care: Level of care: Telemetry Cardiac Family Communication: Discussed all imaging results, lab results, explained to the patient   Disposition Plan:     Status is: Inpatient  Remains inpatient appropriate because: Acute  respiratory failure with hypoxia secondary to acute on chronic diastolic CHF, A. fib with RVR, ESRD needing HD for volume overload      Time Spent in minutes   84mins   Procedures:    Consultants:   Nephrology, Dr. Jonnie Finner Cardiology, Dr. [name]  Antimicrobials:   Anti-infectives (From admission, onward)    Start     Dose/Rate Route Frequency Ordered Stop   10/27/21 2200  cefTRIAXone (ROCEPHIN) 1 g in sodium chloride 0.9 % 100 mL IVPB        1 g 200 mL/hr over 30 Minutes Intravenous Every 24 hours 10/26/21 0108     10/25/21 2200  cefTRIAXone (ROCEPHIN) 2 g in sodium chloride 0.9 % 100 mL IVPB        2 g 200 mL/hr over 30 Minutes Intravenous  Once 10/25/21 2148 10/26/21 0029   10/25/21 2200  doxycycline (VIBRA-TABS) tablet 100 mg        100 mg Oral Every 12 hours 10/25/21 2148 11/01/21 2159          Medications  Scheduled Meds:  apixaban  5 mg Oral BID   atorvastatin  20 mg Oral QHS   doxycycline  100 mg Oral Q12H   furosemide  80 mg Oral See admin instructions   insulin aspart  0-9 Units Subcutaneous TID WC   [START ON 10/27/2021] insulin aspart protamine- aspart  40 Units Subcutaneous BID WC   metoprolol tartrate  25 mg Oral BID   Continuous Infusions:  [START ON 10/27/2021] cefTRIAXone (ROCEPHIN)  IV     PRN Meds:.      Subjective:   Alexander [name] was seen and examined today.  At the time of my examination, was still somewhat feeling short of breath with elevated heart rate, A. fib with RVR.  Patient denies dizziness, chest pain, abdominal pain, N/V.   Objective:   Vitals:   10/26/21 0209 10/26/21 0530 10/26/21 0700 10/26/21 0730  BP: 113/62 120/83 116/68 111/77  Pulse: 100 (!) 117 (!) 113 72  Resp: 18 20 19 19   Temp:      TempSrc:      SpO2: 95% 100% 100% 97%    Intake/Output Summary (Last 24 hours) at 10/26/2021 1012 Last data filed at 10/26/2021 0029 Gross per 24 hour  Intake 100 ml  Output --  Net 100 ml     Wt Readings from Last 3  Encounters:  10/01/21 112.3 kg  09/20/21 117.9 kg  09/06/21 117.9 kg     Exam General: Alert and oriented x 3, NAD Cardiovascular: tachycardia, irregularly irregular Respiratory: Diminished breath sounds in the bases Gastrointestinal: Soft, nontender, nondistended, + bowel sounds Ext: right BKA Neuro: no new deficits Psych: Normal affect and demeanor, alert and oriented x3  Data Reviewed:  I have personally reviewed following labs and imaging studies  Micro Results Recent Results (from the past 240 hour(s))  Resp Panel by RT-PCR (Flu A&B, Covid) Nasopharyngeal Swab     Status: None   Collection Time: 10/25/21  6:32 PM   Specimen: Nasopharyngeal Swab; Nasopharyngeal(NP) swabs in vial transport medium  Result Value Ref Range Status   SARS Coronavirus 2 by RT PCR NEGATIVE NEGATIVE Final    Comment: (NOTE) SARS-CoV-2 target nucleic acids are NOT DETECTED.  The SARS-CoV-2 RNA is generally detectable in upper respiratory specimens during the acute phase of infection. The lowest concentration of SARS-CoV-2 viral copies this assay can detect is 138 copies/mL. A negative result does not preclude SARS-Cov-2 infection and should not be used as the sole basis for treatment or other patient management decisions. A negative result may occur with  improper specimen collection/handling, submission of specimen other than nasopharyngeal swab, presence of viral mutation(s) within the areas targeted by this assay, and inadequate number of viral copies(<138 copies/mL). A negative result must be combined with clinical observations, patient history, and epidemiological information. The expected result is Negative.  Fact Sheet for Patients:  EntrepreneurPulse.com.au  Fact Sheet for Healthcare Providers:  IncredibleEmployment.be  This test is no t yet approved or cleared by the [place] FDA and  has been authorized for detection and/or diagnosis of  SARS-CoV-2 by FDA under an Emergency Use Authorization (EUA). This EUA will remain  in effect (meaning this test can be used) for the duration of the COVID-19 declaration under Section 564(b)(1) of the Act, 21 U.S.C.section 360bbb-3(b)(1), unless the authorization is terminated  or revoked sooner.       Influenza A by PCR NEGATIVE NEGATIVE Final   Influenza B by PCR NEGATIVE NEGATIVE Final    Comment: (NOTE) The Xpert Xpress SARS-CoV-2/FLU/RSV plus assay is intended as an aid in the diagnosis of influenza from Nasopharyngeal swab specimens and should not be used as a sole basis for treatment. Nasal washings and aspirates are unacceptable for Xpert Xpress SARS-CoV-2/FLU/RSV testing.  Fact Sheet for Patients: EntrepreneurPulse.com.au  Fact Sheet for Healthcare Providers: IncredibleEmployment.be  This test is not yet approved or cleared by the [place] FDA and has been authorized for detection and/or diagnosis of SARS-CoV-2 by FDA under an Emergency Use Authorization (EUA). This EUA will remain in effect (meaning this test can be used) for the duration of the COVID-19 declaration under Section 564(b)(1) of the Act, 21 U.S.C. section 360bbb-3(b)(1), unless the authorization is terminated or revoked.  Performed at [facility], [place] [address] [place], [place] [address]     Radiology Reports DG Chest 2 View  Result Date: 10/25/2021 CLINICAL DATA:  Shortness of breath EXAM: CHEST - 2 VIEW COMPARISON:  09/30/2021 FINDINGS: Left-sided pacing device as before. Cardiomegaly. Suspicion of moderate right pleural effusion. Hazy airspace disease and consolidation at the right base. Aortic atherosclerosis. No pneumothorax. IMPRESSION: 1. Clearing of previously noted left upper lobe pneumonia 2. New hazy airspace disease and consolidation at right base suspicious for right basilar pneumonia. Suspect that there is moderate right pleural effusion  3. Cardiomegaly Electronically Signed   By: Donavan Foil M.D.   On: 10/25/2021 19:28   DG Chest Port 1 View  Result Date: 09/30/2021 CLINICAL DATA:  End-stage renal disease.  Pain. EXAM: PORTABLE CHEST 1 VIEW COMPARISON:  September 16, 2021 chest x-ray and November 5th 2022 CT scan of the chest. FINDINGS: The opacity in the left apex remains. Stable  cardiomegaly and pacemaker. Stable dialysis catheter. No other interval changes or acute abnormalities. IMPRESSION: 1. Persistent opacity in the left upper lobe favored 2 represent pneumonia on the recent chest x-ray and CT scan. Recommend follow-up CT imaging after completing treatment, in approximately 3 months, to confirm resolution. Electronically Signed   By: Dorise Bullion III M.D.   On: 09/30/2021 14:49    Lab Data:  CBC: Recent Labs  Lab 10/25/21 1828  WBC 13.0*  NEUTROABS 11.4*  HGB 8.9*  HCT 29.2*  MCV 88.8  PLT 97*   Basic Metabolic Panel: Recent Labs  Lab 10/25/21 1828  NA 127*  K 3.8  CL 87*  CO2 25  GLUCOSE 133*  BUN 42*  CREATININE 4.55*  CALCIUM 7.9*   GFR: CrCl cannot be calculated (Unknown ideal weight.). Liver Function Tests: Recent Labs  Lab 10/25/21 1828  AST 92*  ALT 73*  ALKPHOS 82  BILITOT 1.7*  PROT 6.5  ALBUMIN 2.1*   No results for input(s): LIPASE, AMYLASE in the last 168 hours. No results for input(s): AMMONIA in the last 168 hours. Coagulation Profile: No results for input(s): INR, PROTIME in the last 168 hours. Cardiac Enzymes: No results for input(s): CKTOTAL, CKMB, CKMBINDEX, TROPONINI in the last 168 hours. BNP (last 3 results) No results for input(s): PROBNP in the last 8760 hours. HbA1C: No results for input(s): HGBA1C in the last 72 hours. CBG: Recent Labs  Lab 10/25/21 2021 10/26/21 0802  GLUCAP 110* 108*   Lipid Profile: No results for input(s): CHOL, HDL, LDLCALC, TRIG, CHOLHDL, LDLDIRECT in the last 72 hours. Thyroid Function Tests: No results for input(s): TSH,  T4TOTAL, FREET4, T3FREE, THYROIDAB in the last 72 hours. Anemia Panel: No results for input(s): VITAMINB12, FOLATE, FERRITIN, TIBC, IRON, RETICCTPCT in the last 72 hours. Urine analysis:    Component Value Date/Time   COLORURINE YELLOW 05/23/2020 2057   APPEARANCEUR CLEAR 05/23/2020 2057   LABSPEC 1.008 05/23/2020 2057   PHURINE 5.0 05/23/2020 2057   GLUCOSEU 50 (A) 05/23/2020 2057   HGBUR LARGE (A) 05/23/2020 2057   BILIRUBINUR NEGATIVE 05/23/2020 2057   BILIRUBINUR neg 10/01/2014 1445   KETONESUR NEGATIVE 05/23/2020 2057   PROTEINUR 100 (A) 05/23/2020 2057   UROBILINOGEN 0.2 10/01/2014 1445   UROBILINOGEN 0.2 09/30/2014 2051   NITRITE NEGATIVE 05/23/2020 2057   LEUKOCYTESUR NEGATIVE 05/23/2020 2057     Kamarri Lovvorn M.D. [facility] 10/26/2021, 10:12 AM  Available via Epic secure chat 7am-7pm After 7 pm, please refer to night coverage provider listed on amion.

## 2021-10-26 NOTE — Consult Note (Signed)
CARDIOLOGY CONSULT NOTE  Patient ID: Alexander [name] MRN: [id number] DOB/AGE: June 11, 1961 60 y.o.  Admit date: 10/25/2021 Referring Physician  Ripu Rai, MD Primary Physician:  [name], MD Reason for Consultation  A. FIb and CHF  Patient ID: Alexander [name], male    DOB: 1961-04-26, 60 y.o.   MRN: [id number]  Chief Complaint  Patient presents with   Shortness of Breath   Back Pain   HPI:    Alexander [name]  is a 60 y.o. And Caucasian male patient with paroxysmal atrial fibrillation, sinus node dysfunction and complete heart block SP Medtronic pacemaker implantation on 10/07/2014, coronary artery disease with angioplasty in 2015, PAD SP right BKA, insulin-dependent diabetes with end-stage renal disease on hemodialysis, obesity, chronic back pain and a former tobacco smoker presented to the emergency room due to worsening dyspnea, severe back pain.    Patient has had several hospital admissions and ED evaluations almost every 2 to 3 months.  Upon presentation today he was found to be in atrial fibrillation with RVR and also fluid overload state.  I was consulted to manage A. fib and heart failure.  Patient states that his symptoms are chronic and ongoing for the past 6 months also.  He has not missed any dialysis days.  However over the past 3 weeks he developed severe back pain, difficult to lay down and also worsening dyspnea, was seen in the ED on 09/16/2021 but discharged home.  States that symptoms never went away and she is back in the emergency room.  States that he has been having productive cough bringing up brownish colored sputum and thinks he may have pneumonia.  Denies fever or chills.  He is unable to lay down both due to back pain and dyspnea.  He has not had any chest pain or palpitations.  Past Medical History:  Diagnosis Date   Alcohol abuse    Anemia    Arthritis    "patient does not think so."   Atrial fibrillation ([place])    Cardiac syncope 10/07/2014   rapid A fib  with 8 sec pauses on converison with syncope- temp pacing wire placed then PPM   Cataract    BILATERAL   Coronary artery disease    Diabetes mellitus    dx---been  awhile   ESRD (end stage renal disease) ([place])    [place]   GERD (gastroesophageal reflux disease)    History of blood transfusion    Hyperlipemia    Hypertension    Osteomyelitis ([place])    right foot   Peripheral vascular disease ([place])    Presence of permanent cardiac pacemaker 10/08/2014   Medtronic   Past Surgical History:  Procedure Laterality Date   A/V FISTULAGRAM Right 05/01/2021   Procedure: A/V FISTULAGRAM;  Surgeon: Waynetta Sandy, MD;  Location: [place] [facility];  Service: Cardiovascular;  Laterality: Right;  failed fistulagram   ABDOMINAL AORTOGRAM W/LOWER EXTREMITY N/A 09/09/2017   Procedure: ABDOMINAL AORTOGRAM W/LOWER EXTREMITY;  Surgeon: Angelia Mould, MD;  Location: [place] [facility];  Service: Cardiovascular;  Laterality: N/A;   ABDOMINAL AORTOGRAM W/LOWER EXTREMITY Right 11/11/2019   Procedure: ABDOMINAL AORTOGRAM W/LOWER EXTREMITY;  Surgeon: Marty Heck, MD;  Location: [place] [facility];  Service: Cardiovascular;  Laterality: Right;   AMPUTATION Left 08/19/2013   Procedure: AMPUTATION RAY;  Surgeon: Alta Corning, MD;  Location: [place];  Service: Orthopedics;  Laterality: Left;  ray amputation left 5th  AMPUTATION Left 10/27/2013   Procedure: AMPUTATION DIGIT-LEFT 4TH TOE, 4th and 5th metatarsal.;  Surgeon: Angelia Mould, MD;  Location: [place];  Service: Vascular;  Laterality: Left;   AMPUTATION Left 11/04/2013   Procedure: LEFT FOOT TRANS-METATARSAL AMPUTATION WITH WOUND CLOSURE ;  Surgeon: Alta Corning, MD;  Location: [place];  Service: Orthopedics;  Laterality: Left;   AMPUTATION Right 09/10/2017   Procedure: RIGHT FOURTH AND FIFTH TOE AMPUTATION;  Surgeon: Angelia Mould, MD;  Location: [place];  Service: Vascular;  Laterality: Right;    AMPUTATION Right 02/19/2020   Procedure: AMPUTATION RIGHT 3RD TOE;  Surgeon: Angelia Mould, MD;  Location: [place];  Service: Vascular;  Laterality: Right;   AMPUTATION Right 03/11/2020   Procedure: RIGHT BELOW KNEE AMPUTATION;  Surgeon: Newt Minion, MD;  Location: [place];  Service: Orthopedics;  Laterality: Right;   APPLICATION OF WOUND VAC Right 02/19/2020   Procedure: Application Of Wound Vac Right foot;  Surgeon: Angelia Mould, MD;  Location: [place];  Service: Vascular;  Laterality: Right;   AV FISTULA PLACEMENT Right 03/27/2021   Procedure: RIGHT ARM ARTERIOVENOUS (AV) FISTULA;  Surgeon: Angelia Mould, MD;  Location: [place];  Service: Vascular;  Laterality: Right;   AV FISTULA PLACEMENT Right 05/10/2021   Procedure: RIGHT ARM ARTERIOVENOUS (AV) FISTULA;  Surgeon: Waynetta Sandy, MD;  Location: [place];  Service: Vascular;  Laterality: Right;   [place] Right 08/11/2021   Procedure: RIGHT ARM SECOND STAGE BASILIC VEIN TRANSPOSITION;  Surgeon: Waynetta Sandy, MD;  Location: [place];  Service: Vascular;  Laterality: Right;   BIOPSY  07/20/2020   Procedure: BIOPSY;  Surgeon: Irving Copas., MD;  Location: [place];  Service: Gastroenterology;;   CARDIAC CATHETERIZATION  10/07/2014   Procedure: LEFT HEART CATH AND CORONARY ANGIOGRAPHY;  Surgeon: Laverda Page, MD;  Location[facility] C[facility];  Service: Cardiovascular;;   COLONOSCOPY W/ POLYPECTOMY     COLONOSCOPY WITH PROPOFOL N/A 05/25/2020   Procedure: COLONOSCOPY WITH PROPOFOL;  Surgeon: Gatha Mayer, MD;  Location: [facility];  Service: Endoscopy;  Laterality: N/A;   EMBOLECTOMY Right 09/12/2017   Procedure: Thrombectomy  & Redo Right Below Knee Popliteal Artery Bypass Graft  ;  Surgeon: Waynetta Sandy, MD;  Location: [facility] OR;  Service: Vascular;  Laterality: Right;   ENTEROSCOPY N/A 07/20/2020   Procedure: ENTEROSCOPY;  Surgeon:  Rush Landmark Telford Nab., MD;  Location: [place];  Service: Gastroenterology;  Laterality: N/A;   ESOPHAGOGASTRODUODENOSCOPY (EGD) WITH PROPOFOL N/A 05/24/2020   Procedure: ESOPHAGOGASTRODUODENOSCOPY (EGD) WITH PROPOFOL;  Surgeon: Ladene Artist, MD;  Location: [facility];  Service: Endoscopy;  Laterality: N/A;   EYE SURGERY Bilateral    cataract   FEMORAL-TIBIAL BYPASS GRAFT Left 10/27/2013   Procedure: BYPASS GRAFT LEFT FEMORAL- POSTERIOR TIBIAL ARTERY;  Surgeon: Angelia Mould, MD;  Location: [place];  Service: Vascular;  Laterality: Left;   FEMORAL-TIBIAL BYPASS GRAFT Right 09/10/2017   Procedure: RIGHT SUPERFICIAL  FEMORAL ARTERY-BELOW KNEE POPLITEAL ARTERY BYPASS GRAFT WITH VEIN;  Surgeon: Angelia Mould, MD;  Location: [place];  Service: Vascular;  Laterality: Right;   GIVENS CAPSULE STUDY N/A 07/20/2020   Procedure: GIVENS CAPSULE STUDY;  Surgeon: Irving Copas., MD;  Location: [place];  Service: Gastroenterology;  Laterality: N/A;  capsule placed through scope into duodenum @ 0936   I & D EXTREMITY Left 10/27/2013   Procedure: IRRIGATION AND DEBRIDEMENT EXTREMITY- LEFT FOOT;  Surgeon: Angelia Mould, MD;  Location: [place];  Service: Vascular;  Laterality: Left;   I & D EXTREMITY Right 01/26/2020   Procedure: IRRIGATION AND DEBRIDEMENT EXTREMITY RIGHT FOOT WOUND;  Surgeon: Angelia Mould, MD;  Location: [place];  Service: Vascular;  Laterality: Right;   INSERT / REPLACE / REMOVE PACEMAKER     INTRAOPERATIVE ARTERIOGRAM Right 09/10/2017   Procedure: INTRA OPERATIVE ARTERIOGRAM;  Surgeon: Angelia Mould, MD;  Location: [place];  Service: Vascular;  Laterality: Right;   IR ANGIOGRAM FOLLOW UP STUDY  09/09/2017   IR FLUORO GUIDE CV LINE RIGHT  05/31/2020   IR US GUIDE VASC ACCESS RIGHT  06/01/2020   LEFT HEART CATH AND CORONARY ANGIOGRAPHY N/A 08/19/2020   Procedure: LEFT HEART CATH AND CORONARY ANGIOGRAPHY;  Surgeon: Adrian Prows, MD;   Location: [place] [facility];  Service: Cardiovascular;  Laterality: N/A;   LEFT HEART CATHETERIZATION WITH CORONARY ANGIOGRAM N/A 11/09/2014   Procedure: LEFT HEART CATHETERIZATION WITH CORONARY ANGIOGRAM;  Surgeon: Laverda Page, MD;  Location: [facility] CATH LAB;  Service: Cardiovascular;  Laterality: N/A;   LOWER EXTREMITY ANGIOGRAM Right 11/08/2015   Procedure: Lower Extremity Angiogram;  Surgeon: Serafina Mitchell, MD;  Location: [place] [facility];  Service: Cardiovascular;  Laterality: Right;   LUMBAR LAMINECTOMY  06/13/2012   Procedure: MICRODISCECTOMY LUMBAR LAMINECTOMY;  Surgeon: Jessy Oto, MD;  Location: [place];  Service: Orthopedics;  Laterality: N/A;  Central laminectomy L2-3, L3-4, L4-5   PACEMAKER INSERTION  10/08/2014   MDT Advisa MRI compatible dual chamber pacemaker implanted by Dr Caryl Comes for syncope with post-termination pauses   PERCUTANEOUS CORONARY STENT INTERVENTION (PCI-S)  11/09/2014   des to lad & distal circumflex         Dr  [name]   PERIPHERAL VASCULAR BALLOON ANGIOPLASTY Right 11/11/2019   Procedure: PERIPHERAL VASCULAR BALLOON ANGIOPLASTY;  Surgeon: Marty Heck, MD;  Location: [place] [facility];  Service: Cardiovascular;  Laterality: Right;  below knee popliteal, tibioperoneal trunk, posterior tibial arteries   PERIPHERAL VASCULAR CATHETERIZATION N/A 11/08/2015   Procedure: Abdominal Aortogram;  Surgeon: Serafina Mitchell, MD;  Location: [place] [facility];  Service: Cardiovascular;  Laterality: N/A;   PERIPHERAL VASCULAR CATHETERIZATION Right 11/08/2015   Procedure: Peripheral Vascular Atherectomy;  Surgeon: Serafina Mitchell, MD;  Location: [place] [facility];  Service: Cardiovascular;  Laterality: Right;   PERIPHERAL VASCULAR CATHETERIZATION N/A 10/10/2016   Procedure: Lower Extremity Angiography;  Surgeon: Waynetta Sandy, MD;  Location: [place] [facility];  Service: Cardiovascular;  Laterality: N/A;   PERIPHERAL VASCULAR CATHETERIZATION Right  10/10/2016   Procedure: Peripheral Vascular Atherectomy;  Surgeon: Waynetta Sandy, MD;  Location: [place] [facility];  Service: Cardiovascular;  Laterality: Right;  Popliteal   PERMANENT PACEMAKER INSERTION N/A 10/08/2014   Procedure: PERMANENT PACEMAKER INSERTION;  Surgeon: Deboraha Sprang, MD;  Location: [facility] C[facility] LAB;  Service: Cardiovascular;  Laterality: N/A;   SUBMUCOSAL TATTOO INJECTION  07/20/2020   Procedure: SUBMUCOSAL TATTOO INJECTION;  Surgeon: Irving Copas., MD;  Location: [place];  Service: Gastroenterology;;   TEMPORARY PACEMAKER INSERTION Bilateral 10/07/2014   Procedure: TEMPORARY PACEMAKER INSERTION;  Surgeon: Laverda Page, MD;  Location: [facility] CATH LAB;  Service: Cardiovascular;  Laterality: Bilateral;   WOUND DEBRIDEMENT Right 02/19/2020   Procedure: DEBRIDEMENT WOUND RIGHT FOOT;  Surgeon: Angelia Mould, MD;  Location[facility];  Service: Vascular;  Laterality: Right;   Social History   Tobacco Use   Smoking  status: Former    Packs/day: 1.50    Years: 30.00    Pack years: 45.00    Types: Cigarettes    Quit date: 10/07/2014    Years since quitting: 7.0   Smokeless tobacco: Never  Substance Use Topics   Alcohol use: Yes    Alcohol/week: 21.0 standard drinks    Types: 21 Glasses of wine per week    Comment: cut way back, none in the last month    Family History  Problem Relation Age of Onset   Diabetes type II Mother    Hypertension Mother    Diabetes Mother    Liver cancer Father    Diabetes type II Sister    Breast cancer Sister    Diabetes Sister    Hypertension Sister    Diabetes type II Brother    Kidney failure Brother    Diabetes Brother    Hypertension Brother    Diabetes type II Sister     Marital Status: Married  ROS  Review of Systems  Constitutional: Positive for malaise/fatigue.  Cardiovascular:  Positive for dyspnea on exertion, leg swelling and orthopnea. Negative for chest pain.  Respiratory:  Positive for  cough and sputum production. Negative for hemoptysis.   Musculoskeletal:  Positive for arthritis, back pain and joint pain.  Gastrointestinal:  Negative for melena.  All other systems reviewed and are negative. Objective   Vitals with BMI 10/26/2021 10/26/2021 10/26/2021  Height - - -  Weight - - -  BMI - - -  Systolic - 008 676  Diastolic - 63 77  Pulse 195 114 72    Blood pressure 131/63, pulse (!) 126, temperature 98.8 F (37.1 C), temperature source Oral, resp. rate 20, SpO2 100 %.    Physical Exam Constitutional:      Appearance: He is obese. He is ill-appearing.  HENT:     Mouth/Throat:     Mouth: Mucous membranes are moist.  Eyes:     Extraocular Movements: Extraocular movements intact.  Neck:     Vascular: JVD present. No carotid bruit.  Cardiovascular:     Rate and Rhythm: Tachycardia present. Rhythm irregularly irregular.     Pulses: Intact distal pulses.          Femoral pulses are 2+ on the right side and 2+ on the left side.       Right popliteal pulse not accessible.       Dorsalis pedis pulses are 2+ on the left side. Right dorsalis pedis pulse not accessible.       Posterior tibial pulses are 0 on the left side. Right posterior tibial pulse not accessible.     Heart sounds: Normal heart sounds. No murmur heard.   No gallop.  Pulmonary:     Effort: Pulmonary effort is normal.     Breath sounds: Normal breath sounds.  Abdominal:     General: Bowel sounds are normal.     Palpations: Abdomen is soft.  Musculoskeletal:        General: Deformity (right BKA) present.     Left lower leg: Edema (2-3+ pitting below knee edema) present.  Skin:    Capillary Refill: Capillary refill takes less than 2 seconds.  Neurological:     General: No focal deficit present.     Mental Status: He is alert and oriented to person, place, and time.   Laboratory examination:   Recent Labs    09/30/21 1053 10/01/21 0430 10/25/21 1828  NA 138 137  127*  K 3.6 3.4* 3.8  CL  99 98 87*  CO2 31 30 25   GLUCOSE 130* 161* 133*  BUN 6* 6* 42*  CREATININE 1.80* 1.79* 4.55*  CALCIUM 8.5* 8.7* 7.9*  GFRNONAA 42* 42* 14*   CrCl cannot be calculated (Unknown ideal weight.).  CMP Latest Ref Rng & Units 10/25/2021 10/01/2021 09/30/2021  Glucose 70 - 99 mg/dL 133(H) 161(H) 130(H)  BUN 8 - 23 mg/dL 42(H) 6(L) 6(L)  Creatinine 0.61 - 1.24 mg/dL 4.55(H) 1.79(H) 1.80(H)  Sodium 135 - 145 mmol/L 127(L) 137 138  Potassium 3.5 - 5.1 mmol/L 3.8 3.4(L) 3.6  Chloride 98 - 111 mmol/L 87(L) 98 99  CO2 22 - 32 mmol/L 25 30 31   Calcium 8.9 - 10.3 mg/dL 7.9(L) 8.7(L) 8.5(L)  Total Protein 6.5 - 8.1 g/dL 6.5 - -  Total Bilirubin 0.3 - 1.2 mg/dL 1.7(H) - -  Alkaline Phos 38 - 126 U/L 82 - -  AST 15 - 41 U/L 92(H) - -  ALT 0 - 44 U/L 73(H) - -   CBC Latest Ref Rng & Units 10/25/2021 10/01/2021 09/30/2021  WBC 4.0 - 10.5 K/uL 13.0(H) 6.2 6.2  Hemoglobin 13.0 - 17.0 g/dL 8.9(L) 9.1(L) 7.0(L)  Hematocrit 39.0 - 52.0 % 29.2(L) 30.3(L) 23.7(L)  Platelets 150 - 400 K/uL 97(L) 205 161   Lipid Panel No results for input(s): CHOL, TRIG, LDLCALC, VLDL, HDL, CHOLHDL, LDLDIRECT in the last 8760 hours.  HEMOGLOBIN A1C Lab Results  Component Value Date   HGBA1C 5.5 05/23/2020   MPG 111.15 05/23/2020   TSH No results for input(s): TSH in the last 8760 hours. BNP (last 3 results) Recent Labs    10/25/21 1830  BNP 1,181.3*   Cardiac Panel (last 3 results) Recent Labs    10/25/21 1828 10/25/21 2109  TROPONINIHS 40* 43*     Medications and allergies   Allergies  Allergen Reactions   Cytoxan [Cyclophosphamide]     pancytopenia   Keflex [Cephalexin] Nausea And Vomiting   Pletal [Cilostazol] Palpitations    "Can hear heart beating loudly".     Current Meds  Medication Sig   NOVOLIN 70/30 (70-30) 100 UNIT/ML injection Inject 65-80 Units into the skin See admin instructions.    Scheduled Meds:  apixaban  5 mg Oral BID   atorvastatin  20 mg Oral QHS   doxycycline  100 mg  Oral Q12H   furosemide  80 mg Oral See admin instructions   insulin aspart  0-9 Units Subcutaneous TID WC   [START ON 10/27/2021] insulin aspart protamine- aspart  40 Units Subcutaneous BID WC   metoprolol tartrate  25 mg Oral BID   Continuous Infusions:  [START ON 10/27/2021] cefTRIAXone (ROCEPHIN)  IV     PRN Meds:.   I/O last 3 completed shifts: In: 100 [IV Piggyback:100] Out: -  No intake/output data recorded.    Radiology:  Chest x-ray PA and lateral view 10/25/2021: Comparison 09/30/2021. 1. Clearing of previously noted left upper lobe pneumonia  2. New hazy airspace disease and consolidation at right base suspicious for right basilar pneumonia. Suspect that there is moderate right pleural effusion 3. Cardiomegaly  Cardiac Studies:   Pacemaker in situ: Medtronic advica DR (MRI compatible) dual-chamber pacemaker 10/07/2014  Left Heart Catheterization 08/19/20:  Prior coronary angiogram: 11/09/2014: Diamondback CSI coronary atherectomy and stenting of the mid LAD-3.0 x 28 mm Promus Premier DES following. Distal dominant circumflex coronary artery following diamondback atherectomy and implantation of a 2.5 x 16 mm Promus  Premier DES. Previously placed stents in LAD and CX patent. Mild to moderate diffuse disease in LAD, D1 and D2 and distal dominant Cx. RCA non dominant and severely diseased and small.  Echocardiogram 05/24/2020:    Left ventricular ejection fraction, by estimation, is 60 to 65%. The  left ventricle has normal function. The left ventricle has no regional  wall motion abnormalities. There is severe concentric left ventricular  hypertrophy. Left ventricular diastolic   parameters are consistent with Grade II diastolic dysfunction  (pseudonormalization).   Scheduled  In office pacemaker check 05/24/21:  Single (S)/Dual (D)/BV: Dual. Presenting ASVS. Pacemaker dependant:  No. Underlying A. Fib. AP 14%, VP 3%. AMS Episodes 320.  AT/AF burden 44.6% . Longest 36  hours. Latest 02/13/2021. Today 6 hours and ongoing. HVR 1 = False. Longevity 3 Years. Magnet rate: >85%. Lead measurements: Stable. Histogram: Low (L)/normal (N)/high (H)  Normal. Patient activity Low.   Observations: Normal pacemaker function. Changes: None.  EKG:  EKG 10/26/2021: Atrial fibrillation with rapid ventricular response at rate of 139 bpm, left axis deviation, left anterior fascicular block.  Right bundle branch block.  Assessment   1.  Atrial fibrillation with rapid ventricular response 2.  Acute on chronic diastolic heart failure 3.  End-stage renal disease on hemodialysis 4.  CAD of the native vessel with other forms of angina pectoris 5.  End-stage renal disease on hemodialysis 6.  Sinus node dysfunction SP permanent pacemaker implantation on 10/08/2014.  Recommendations:   Patient with recurrence of atrial fibrillation with RVR, will increase metoprolol to 50 mg p.o. twice daily + diltiazem CD1 20 mg daily.    With regard to heart failure, he needs dialysis and fluid mobilization.  He has end-stage renal disease and does have urine output but not significant enough to decrease CHF and volume overload state.  Patient has been compliant with hemodialysis.  His main complaint today is chronic back pain and dyspnea on exertion, do not suspect ACS.    We will continue to follow.  Blood pressures well controlled, he has not had any anginal symptoms with regard to CAD, continue present medications.  Pacemaker is functioning normally.  He has not had any recent transmissions, last time we checked them was at the office visit on 05/24/2021.  Will encourage them to transmit remotely as well.  He is also having brownish colored sputum, chest x-ray is concerning for pneumonia as well.  This management will be per primary team.  Right pleural effusion is secondary to fluid overload state.   Adrian Prows, MD, [facility] 10/26/2021, 10:40 AM Office: [phone]

## 2021-10-26 NOTE — Consult Note (Signed)
Renal Service Consult Note [facility]  Alexander [name] 10/26/2021 Sol Blazing, MD Requesting Physician: Dr. Tana Coast  Reason for Consult: ESRD pt w/ CAP HPI: The patient is a 60 y.o. year-old w/ hx of ESRD on HD, atrial fib, CAD, DM, HTN, HL, PAD sp R BKA, sp PPM who presented to ED w/ worsening cough and SOB early this am today. Pt stated had PNA in early November rx'd w/ course of abx, not felt the same since then. Persistent SOB and cough. Fatigue and poor appetite. Worsening leg edema. Good compliance w/ HD, last HD yesterday. In ED pt afebrile, afib RVR initial HR 140's, hypoxic down to 80%, placed on 2 L [place].  WBC 13K, Hb 8.9, creat 4.5.  trop 43. BMP 1180.  CXR showed new R basilar PNA/ effusion. Pt was given IV rocephin and doxycycline for pna. Also IV metoprolol for afib /RVR.  We are asked to see for ESRD.    Pt seen in room. As above, has not felt good since PNA rx in [place]. Losing wt, poor appetite, cough, fatigue. Good HD complaince.   Pt had new access 1st stage RUA AVF in June 2022,  and 2nd stage in Sept 2022.  Using AVF now.    ROS - denies CP, no joint pain, no HA, no blurry vision, no rash, no diarrhea, no nausea/ vomiting   Past Medical History  Past Medical History:  Diagnosis Date   Alcohol abuse    Anemia    Arthritis    "patient does not think so."   Atrial fibrillation ([place])    Cardiac syncope 10/07/2014   rapid A fib with 8 sec pauses on converison with syncope- temp pacing wire placed then PPM   Cataract    BILATERAL   Coronary artery disease    Diabetes mellitus    dx---been  awhile   ESRD (end stage renal disease) ([place])    [place]   GERD (gastroesophageal reflux disease)    History of blood transfusion    Hyperlipemia    Hypertension    Osteomyelitis ([place])    right foot   Peripheral vascular disease ([place])    Presence of permanent cardiac pacemaker 10/08/2014   Medtronic   Past Surgical History  Past  Surgical History:  Procedure Laterality Date   A/V FISTULAGRAM Right 05/01/2021   Procedure: A/V FISTULAGRAM;  Surgeon: Waynetta Sandy, MD;  Location: [place] [facility];  Service: Cardiovascular;  Laterality: Right;  failed fistulagram   ABDOMINAL AORTOGRAM W/LOWER EXTREMITY N/A 09/09/2017   Procedure: ABDOMINAL AORTOGRAM W/LOWER EXTREMITY;  Surgeon: Angelia Mould, MD;  Location: [place] [facility];  Service: Cardiovascular;  Laterality: N/A;   ABDOMINAL AORTOGRAM W/LOWER EXTREMITY Right 11/11/2019   Procedure: ABDOMINAL AORTOGRAM W/LOWER EXTREMITY;  Surgeon: Marty Heck, MD;  Location: [place] [facility];  Service: Cardiovascular;  Laterality: Right;   AMPUTATION Left 08/19/2013   Procedure: AMPUTATION RAY;  Surgeon: Alta Corning, MD;  Location: [place];  Service: Orthopedics;  Laterality: Left;  ray amputation left 5th   AMPUTATION Left 10/27/2013   Procedure: AMPUTATION DIGIT-LEFT 4TH TOE, 4th and 5th metatarsal.;  Surgeon: Angelia Mould, MD;  Location: [place];  Service: Vascular;  Laterality: Left;   AMPUTATION Left 11/04/2013   Procedure: LEFT FOOT TRANS-METATARSAL AMPUTATION WITH WOUND CLOSURE ;  Surgeon: Alta Corning, MD;  Location: [place];  Service: Orthopedics;  Laterality: Left;   AMPUTATION Right 09/10/2017  Procedure: RIGHT FOURTH AND FIFTH TOE AMPUTATION;  Surgeon: Angelia Mould, MD;  Location: [place];  Service: Vascular;  Laterality: Right;   AMPUTATION Right 02/19/2020   Procedure: AMPUTATION RIGHT 3RD TOE;  Surgeon: Angelia Mould, MD;  Location: [place];  Service: Vascular;  Laterality: Right;   AMPUTATION Right 03/11/2020   Procedure: RIGHT BELOW KNEE AMPUTATION;  Surgeon: Newt Minion, MD;  Location: [place];  Service: Orthopedics;  Laterality: Right;   APPLICATION OF WOUND VAC Right 02/19/2020   Procedure: Application Of Wound Vac Right foot;  Surgeon: Angelia Mould, MD;  Location: [place];  Service: Vascular;   Laterality: Right;   AV FISTULA PLACEMENT Right 03/27/2021   Procedure: RIGHT ARM ARTERIOVENOUS (AV) FISTULA;  Surgeon: Angelia Mould, MD;  Location: [place];  Service: Vascular;  Laterality: Right;   AV FISTULA PLACEMENT Right 05/10/2021   Procedure: RIGHT ARM ARTERIOVENOUS (AV) FISTULA;  Surgeon: Waynetta Sandy, MD;  Location: [place];  Service: Vascular;  Laterality: Right;   [place] Right 08/11/2021   Procedure: RIGHT ARM SECOND STAGE BASILIC VEIN TRANSPOSITION;  Surgeon: Waynetta Sandy, MD;  Location: [place];  Service: Vascular;  Laterality: Right;   BIOPSY  07/20/2020   Procedure: BIOPSY;  Surgeon: Irving Copas., MD;  Location: [place];  Service: Gastroenterology;;   CARDIAC CATHETERIZATION  10/07/2014   Procedure: LEFT HEART CATH AND CORONARY ANGIOGRAPHY;  Surgeon: Laverda Page, MD;  Location: [facility] CATH LAB;  Service: Cardiovascular;;   COLONOSCOPY W/ POLYPECTOMY     COLONOSCOPY WITH PROPOFOL N/A 05/25/2020   Procedure: COLONOSCOPY WITH PROPOFOL;  Surgeon: Gatha Mayer, MD;  Location: [facility];  Service: Endoscopy;  Laterality: N/A;   EMBOLECTOMY Right 09/12/2017   Procedure: Thrombectomy  & Redo Right Below Knee Popliteal Artery Bypass Graft  ;  Surgeon: Waynetta Sandy, MD;  Location: [facility] OR;  Service: Vascular;  Laterality: Right;   ENTEROSCOPY N/A 07/20/2020   Procedure: ENTEROSCOPY;  Surgeon: Rush Landmark Telford Nab., MD;  Location: [place];  Service: Gastroenterology;  Laterality: N/A;   ESOPHAGOGASTRODUODENOSCOPY (EGD) WITH PROPOFOL N/A 05/24/2020   Procedure: ESOPHAGOGASTRODUODENOSCOPY (EGD) WITH PROPOFOL;  Surgeon: Ladene Artist, MD;  Location: [facility];  Service: Endoscopy;  Laterality: N/A;   EYE SURGERY Bilateral    cataract   FEMORAL-TIBIAL BYPASS GRAFT Left 10/27/2013   Procedure: BYPASS GRAFT LEFT FEMORAL- POSTERIOR TIBIAL ARTERY;  Surgeon: Angelia Mould,  MD;  Location: [place];  Service: Vascular;  Laterality: Left;   FEMORAL-TIBIAL BYPASS GRAFT Right 09/10/2017   Procedure: RIGHT SUPERFICIAL  FEMORAL ARTERY-BELOW KNEE POPLITEAL ARTERY BYPASS GRAFT WITH VEIN;  Surgeon: Angelia Mould, MD;  Location: [place];  Service: Vascular;  Laterality: Right;   GIVENS CAPSULE STUDY N/A 07/20/2020   Procedure: GIVENS CAPSULE STUDY;  Surgeon: Irving Copas., MD;  Location: [place];  Service: Gastroenterology;  Laterality: N/A;  capsule placed through scope into duodenum @ 0936   I & D EXTREMITY Left 10/27/2013   Procedure: IRRIGATION AND DEBRIDEMENT EXTREMITY- LEFT FOOT;  Surgeon: Angelia Mould, MD;  Location: [place];  Service: Vascular;  Laterality: Left;   I & D EXTREMITY Right 01/26/2020   Procedure: IRRIGATION AND DEBRIDEMENT EXTREMITY RIGHT FOOT WOUND;  Surgeon: Angelia Mould, MD;  Location: [place];  Service: Vascular;  Laterality: Right;   INSERT / REPLACE / REMOVE PACEMAKER     INTRAOPERATIVE ARTERIOGRAM Right 09/10/2017   Procedure: INTRA  OPERATIVE ARTERIOGRAM;  Surgeon: Angelia Mould, MD;  Location: [place];  Service: Vascular;  Laterality: Right;   IR ANGIOGRAM FOLLOW UP STUDY  09/09/2017   IR FLUORO GUIDE CV LINE RIGHT  05/31/2020   IR US GUIDE VASC ACCESS RIGHT  06/01/2020   LEFT HEART CATH AND CORONARY ANGIOGRAPHY N/A 08/19/2020   Procedure: LEFT HEART CATH AND CORONARY ANGIOGRAPHY;  Surgeon: Adrian Prows, MD;  Location: [place] CV LAB;  Service: Cardiovascular;  Laterality: N/A;   LEFT HEART CATHETERIZATION WITH CORONARY ANGIOGRAM N/A 11/09/2014   Procedure: LEFT HEART CATHETERIZATION WITH CORONARY ANGIOGRAM;  Surgeon: Laverda Page, MD;  Location: [facility] CATH LAB;  Service: Cardiovascular;  Laterality: N/A;   LOWER EXTREMITY ANGIOGRAM Right 11/08/2015   Procedure: Lower Extremity Angiogram;  Surgeon: Serafina Mitchell, MD;  Location: [place] [facility];  Service: Cardiovascular;  Laterality: Right;    LUMBAR LAMINECTOMY  06/13/2012   Procedure: MICRODISCECTOMY LUMBAR LAMINECTOMY;  Surgeon: Jessy Oto, MD;  Location: [place];  Service: Orthopedics;  Laterality: N/A;  Central laminectomy L2-3, L3-4, L4-5   PACEMAKER INSERTION  10/08/2014   MDT Advisa MRI compatible dual chamber pacemaker implanted by Dr Caryl Comes for syncope with post-termination pauses   PERCUTANEOUS CORONARY STENT INTERVENTION (PCI-S)  11/09/2014   des to lad & distal circumflex         Dr  [name]   PERIPHERAL VASCULAR BALLOON ANGIOPLASTY Right 11/11/2019   Procedure: PERIPHERAL VASCULAR BALLOON ANGIOPLASTY;  Surgeon: Marty Heck, MD;  Location: [place] [facility];  Service: Cardiovascular;  Laterality: Right;  below knee popliteal, tibioperoneal trunk, posterior tibial arteries   PERIPHERAL VASCULAR CATHETERIZATION N/A 11/08/2015   Procedure: Abdominal Aortogram;  Surgeon: Serafina Mitchell, MD;  Location: [place] [facility];  Service: Cardiovascular;  Laterality: N/A;   PERIPHERAL VASCULAR CATHETERIZATION Right 11/08/2015   Procedure: Peripheral Vascular Atherectomy;  Surgeon: Serafina Mitchell, MD;  Location: [place] [facility];  Service: Cardiovascular;  Laterality: Right;   PERIPHERAL VASCULAR CATHETERIZATION N/A 10/10/2016   Procedure: Lower Extremity Angiography;  Surgeon: Waynetta Sandy, MD;  Location: [place] [facility] LAB;  Service: Cardiovascular;  Laterality: N/A;   PERIPHERAL VASCULAR CATHETERIZATION Right 10/10/2016   Procedure: Peripheral Vascular Atherectomy;  Surgeon: Waynetta Sandy, MD;  Location: [place] [facility];  Service: Cardiovascular;  Laterality: Right;  Popliteal   PERMANENT PACEMAKER INSERTION N/A 10/08/2014   Procedure: PERMANENT PACEMAKER INSERTION;  Surgeon: Deboraha Sprang, MD;  Location: [facility] CATH LAB;  Service: Cardiovascular;  Laterality: N/A;   SUBMUCOSAL TATTOO INJECTION  07/20/2020   Procedure: SUBMUCOSAL TATTOO INJECTION;  Surgeon: Irving Copas., MD;  Location: [place];  Service: Gastroenterology;;   TEMPORARY PACEMAKER INSERTION Bilateral 10/07/2014   Procedure: TEMPORARY PACEMAKER INSERTION;  Surgeon: Laverda Page, MD;  Location: [facility] CATH LAB;  Service: Cardiovascular;  Laterality: Bilateral;   WOUND DEBRIDEMENT Right 02/19/2020   Procedure: DEBRIDEMENT WOUND RIGHT FOOT;  Surgeon: Angelia Mould, MD;  Location: [facility] OR;  Service: Vascular;  Laterality: Right;   Family History  Family History  Problem Relation Age of Onset   Diabetes type II Mother    Hypertension Mother    Diabetes Mother    Liver cancer Father    Diabetes type II Sister    Breast cancer Sister    Diabetes Sister    Hypertension Sister    Diabetes type II Brother    Kidney failure Brother    Diabetes Brother    Hypertension  Brother    Diabetes type II Sister    Social History  reports that he quit smoking about 7 years ago. His smoking use included cigarettes. He has a 45.00 pack-year smoking history. He has never used smokeless tobacco. He reports current alcohol use of about 21.0 standard drinks per week. He reports that he does not use drugs. Allergies  Allergies  Allergen Reactions   Cytoxan [Cyclophosphamide]     pancytopenia   Keflex [Cephalexin] Nausea And Vomiting   Pletal [Cilostazol] Palpitations    "Can hear heart beating loudly".   Home medications Prior to Admission medications   Medication Sig Start Date End Date Taking? Authorizing Provider  ACCU-CHEK FASTCLIX LANCETS MISC Check blood sugar TID & QHS 10/01/14   Lorayne Marek, MD  acetaminophen (TYLENOL) 500 MG tablet Take 1,000 mg by mouth every 6 (six) hours as needed for moderate pain or headache.    [provider]  apixaban (ELIQUIS) 5 MG TABS tablet Take 1 tablet (5 mg total) by mouth 2 (two) times daily. 10/09/21 01/07/22  Adrian Prows, MD  atorvastatin (LIPITOR) 20 MG tablet Take 1 tablet (20 mg total) by mouth at bedtime. 10/01/21   Aline August, MD  blood glucose meter  kit and supplies KIT Check blood sugar TID & QHS 02/10/15   Advani, Vernon Prey, MD  Blood Glucose Monitoring Suppl (ACCU-CHEK ADVANTAGE DIABETES) kit Use as instructed 10/01/14   Lorayne Marek, MD  furosemide (LASIX) 80 MG tablet Take 80 mg by mouth See admin instructions. Take 1 tablet (80 mg) by mouth on (non-dialysis days) Mondays, Wednesdays, Fridays & Sundays in the morning. 02/26/21   [provider]  glucose blood (ACCU-CHEK AVIVA PLUS) test strip Use as instructed 10/01/14   Advani, Vernon Prey, MD  glucose blood (CHOICE DM FORA G20 TEST STRIPS) test strip Check blood sugar TID & QHS 02/10/15   Advani, Vernon Prey, MD  glucose blood test strip Use as instructed 11/20/13   Reyne Dumas, MD  insulin NPH-regular Human (70-30) 100 UNIT/ML injection Inject 60 Units into the skin 2 (two) times daily with a meal. 10/01/21   Aline August, MD  methocarbamol (ROBAXIN) 500 MG tablet Take 500 mg by mouth 3 (three) times daily. 10/18/21   [provider]  metoprolol tartrate (LOPRESSOR) 25 MG tablet Take 1 tablet (25 mg total) by mouth 2 (two) times daily. 05/24/21 05/19/22  Adrian Prows, MD  Multiple Vitamin (MULTIVITAMIN WITH MINERALS) TABS tablet Take 1 tablet by mouth daily.    [provider]  predniSONE (STERAPRED UNI-PAK 21 TAB) 5 MG (21) TBPK tablet Take by mouth. 10/18/21   [provider]     Vitals:   10/26/21 0209 10/26/21 0530 10/26/21 0700 10/26/21 0730  BP: 113/62 120/83 116/68 111/77  Pulse: 100 (!) 117 (!) 113 72  Resp: '18 20 19 19  ' Temp:      TempSrc:      SpO2: 95% 100% 100% 97%   Exam Gen alert, nasal O2 2 L, no distress, eating breakfast No rash, cyanosis or gangrene Sclera anicteric, throat clear  No jvd or bruits Chest dec'd R base, L clear RRR no RG  Abd soft ntnd no mass or ascites +bs obese GU normal  MS R BKA, L foot TMA wrapped Ext  2+ diffuse LLE edema, no RLE/ stump edema Neuro is alert, Ox 3 , nonfocal        Home meds include - eliquis,  lipitor, lasix , 70-30 insulin, metoprolol 25 bid, mvi  CXR - IMPRESSION: New hazy airspace disease and consolidation at right base suspicious for right basilar pneumonia. Suspect that there is moderate right pleural effusion. Cardiomegaly   OP HD: GO TTS  4h 60mn  108.5kg   400/800   2/2.5 bath  TDC   AVF RUA Hep none  - mircera 225 last 12/1 - calcitriol 0.5 ug tiw     Assessment/ Plan: PNA - w/ cough, mild SOB, RLL infiltrate on CXR. Started on IV abx ESRD - on HD TTS. Plan HD today upstairs.  Volume - + LLE edema, probably losing body wt (hx poor appetite for 1-2 mos) HTN - takes only metropolol, which may be for afib Afib/ RVR - HR 100-120 range here, per pmd, on BB DM2 - on insulin Anemia ckd - Hb 8.9, due to esa, ordering darbe 200 weekly on Thursday MBD ckd - Ca ok, check phos, cont vdra      RKelly Splinter MD 10/26/2021, 10:08 AM  Recent Labs  Lab 10/25/21 1828  WBC 13.0*  HGB 8.9*   Recent Labs  Lab 10/25/21 1828  K 3.8  BUN 42*  CREATININE 4.55*  CALCIUM 7.9*

## 2021-10-26 NOTE — Progress Notes (Signed)
°  Echocardiogram 2D Echocardiogram with contrast has been performed.  Merrie Roof F 10/26/2021, 2:10 PM

## 2021-10-27 ENCOUNTER — Encounter (HOSPITAL_COMMUNITY)
Disposition: A | Discharge status: Home / Self Care | Payer: Self-pay | Source: Home / Self Care | Attending: Family Medicine

## 2021-10-27 ENCOUNTER — Other Ambulatory Visit: Payer: Self-pay

## 2021-10-27 ENCOUNTER — Inpatient Hospital Stay (HOSPITAL_COMMUNITY): Payer: Medicare Other

## 2021-10-27 ENCOUNTER — Encounter (HOSPITAL_COMMUNITY): Payer: Self-pay | Admitting: Family Medicine

## 2021-10-27 DIAGNOSIS — I330 Acute and subacute infective endocarditis: Secondary | ICD-10-CM

## 2021-10-27 DIAGNOSIS — J189 Pneumonia, unspecified organism: Secondary | ICD-10-CM

## 2021-10-27 HISTORY — PX: TEE WITHOUT CARDIOVERSION: SHX5443

## 2021-10-27 LAB — ECHOCARDIOGRAM COMPLETE
AR max vel: 2.23 cm2
AV Area VTI: 2.03 cm2
AV Area mean vel: 1.92 cm2
AV Mean grad: 3 mmHg
AV Peak grad: 6.1 mmHg
Ao pk vel: 1.23 m/s
Calc EF: 44.4 %
S' Lateral: 3 cm
Single Plane A2C EF: 60.9 %
Single Plane A4C EF: 16.8 %

## 2021-10-27 LAB — GLUCOSE, CAPILLARY
Glucose-Capillary: 177 mg/dL — ABNORMAL HIGH (ref 70–99)
Glucose-Capillary: 213 mg/dL — ABNORMAL HIGH (ref 70–99)
Glucose-Capillary: 157 mg/dL — ABNORMAL HIGH (ref 70–99)
Glucose-Capillary: 121 mg/dL — ABNORMAL HIGH (ref 70–99)
Glucose-Capillary: 138 mg/dL — ABNORMAL HIGH (ref 70–99)

## 2021-10-27 LAB — HEPATITIS B SURFACE ANTIBODY, QUANTITATIVE: Hep B S AB Quant (Post): 40.5 m[IU]/mL (ref >9.9)

## 2021-10-27 SURGERY — ECHOCARDIOGRAM, TRANSESOPHAGEAL
Anesthesia: Moderate Sedation

## 2021-10-27 MED ORDER — VANCOMYCIN HCL 10 G IV SOLR
2500.0000 mg | Freq: Once | INTRAVENOUS | Status: AC
  Administered 2021-10-27: 2500 mg via INTRAVENOUS
  Filled 2021-10-27: qty 2500

## 2021-10-27 MED ORDER — SODIUM CHLORIDE 0.9 % IV SOLN
2.0000 g | INTRAVENOUS | Status: DC
  Administered 2021-10-31: 20:00:00 2 g via INTRAVENOUS
  Filled 2021-10-27 (×3): qty 2

## 2021-10-27 MED ORDER — MIDAZOLAM HCL (PF) 5 MG/ML IJ SOLN
INTRAMUSCULAR | Status: AC
  Filled 2021-10-27: qty 2

## 2021-10-27 MED ORDER — FENTANYL CITRATE (PF) 100 MCG/2ML IJ SOLN
INTRAMUSCULAR | Status: AC
  Filled 2021-10-27: qty 4

## 2021-10-27 MED ORDER — VANCOMYCIN HCL IN DEXTROSE 1-5 GM/200ML-% IV SOLN
1000.0000 mg | INTRAVENOUS | Status: DC
Start: 2021-10-28 — End: 2021-11-02
  Administered 2021-10-31: 1000 mg via INTRAVENOUS
  Filled 2021-10-27 (×5): qty 200

## 2021-10-27 MED ORDER — GUAIFENESIN-DM 100-10 MG/5ML PO SYRP
5.0000 mL | ORAL_SOLUTION | ORAL | Status: DC | PRN
  Administered 2021-10-27 – 2021-11-06 (×16): 5 mL via ORAL
  Filled 2021-10-27 (×19): qty 5

## 2021-10-27 MED ORDER — CEFEPIME HCL 2 G IJ SOLR
2.0000 g | Freq: Once | INTRAMUSCULAR | Status: AC
  Administered 2021-10-27: 2 g via INTRAVENOUS
  Filled 2021-10-27: qty 2

## 2021-10-27 MED ORDER — SODIUM CHLORIDE 0.9 % IV SOLN: INTRAVENOUS | Status: DC

## 2021-10-27 MED ORDER — FENTANYL CITRATE (PF) 100 MCG/2ML IJ SOLN
INTRAMUSCULAR | Status: DC | PRN
  Administered 2021-10-27: 25 ug via INTRAVENOUS

## 2021-10-27 MED ORDER — RENA-VITE PO TABS
1.0000 | ORAL_TABLET | Freq: Every day | ORAL | Status: DC
  Administered 2021-10-27 – 2021-11-05 (×10): 1 via ORAL
  Filled 2021-10-27 (×10): qty 1

## 2021-10-27 MED ORDER — METOPROLOL TARTRATE 5 MG/5ML IV SOLN
INTRAVENOUS | Status: AC
  Filled 2021-10-27: qty 5

## 2021-10-27 MED ORDER — BUTAMBEN-TETRACAINE-BENZOCAINE 2-2-14 % EX AERO
INHALATION_SPRAY | CUTANEOUS | Status: DC | PRN
  Administered 2021-10-27: 1 via TOPICAL

## 2021-10-27 MED ORDER — MIDAZOLAM HCL (PF) 5 MG/ML IJ SOLN
INTRAMUSCULAR | Status: DC | PRN
  Administered 2021-10-27: 2 mg via INTRAVENOUS

## 2021-10-27 NOTE — Progress Notes (Signed)
°  Echocardiogram Echocardiogram Transesophageal has been performed.  Johny Chess 10/27/2021, 4:57 PM

## 2021-10-27 NOTE — H&P (View-Only) (Signed)
Subjective:  Symptoms of dyspnea has improved. C/O chest pain on the right with coughing. Leg edema has improved.   Intake/Output from previous day:  I/O last 3 completed shifts: In: 580 [P.O.:480; IV Piggyback:100] Out: 2026 [Urine:650; Other:1376] No intake/output data recorded.  Blood pressure (!) 92/58, pulse 88, temperature 98 F (36.7 C), temperature source Oral, resp. rate 18, height 6' (1.829 m), weight 107.4 kg, SpO2 100 %. Physical Exam Constitutional:      General: He is not in acute distress.    Appearance: He is obese.  Eyes:     Extraocular Movements: Extraocular movements intact.  Neck:     Vascular: No carotid bruit or JVD.  Cardiovascular:     Rate and Rhythm: Normal rate and regular rhythm.     Pulses: Intact distal pulses.          Carotid pulses are 2+ on the right side and 2+ on the left side.      Femoral pulses are 2+ on the right side and 2+ on the left side.       Right popliteal pulse not accessible.       Dorsalis pedis pulses are 1+ on the left side. Right dorsalis pedis pulse not accessible.       Posterior tibial pulses are 0 on the left side.     Heart sounds: Normal heart sounds. No murmur heard.   No gallop.  Pulmonary:     Effort: Pulmonary effort is normal.     Breath sounds: Normal breath sounds.  Abdominal:     General: Bowel sounds are normal.     Palpations: Abdomen is soft.  Musculoskeletal:        General: Deformity (Right below-knee amputation) present.     Cervical back: Neck supple.     Left lower leg: Edema (1+ pitting edema) present.  Skin:    General: Skin is warm.     Capillary Refill: Capillary refill takes less than 2 seconds.  Neurological:     General: No focal deficit present.     Mental Status: He is oriented to person, place, and time.    Lab Results: BMP BNP (last 3 results) Recent Labs    10/25/21 1830  BNP 1,181.3*    ProBNP (last 3 results) No results for input(s): PROBNP in the last 8760 hours. BMP  Latest Ref Rng & Units 10/26/2021 10/25/2021 10/01/2021  Glucose 70 - 99 mg/dL 148(H) 133(H) 161(H)  BUN 8 - 23 mg/dL 36(H) 42(H) 6(L)  Creatinine 0.61 - 1.24 mg/dL 3.65(H) 4.55(H) 1.79(H)  Sodium 135 - 145 mmol/L 130(L) 127(L) 137  Potassium 3.5 - 5.1 mmol/L 3.6 3.8 3.4(L)  Chloride 98 - 111 mmol/L 92(L) 87(L) 98  CO2 22 - 32 mmol/L _0 Calcium 8.9 - 10.3 mg/dL 7.3(L) 7.9(L) 8.7(L)   Hepatic Function Latest Ref Rng & Units 10/25/2021 08/19/2020 07/21/2020  Total Protein 6.5 - 8.1 g/dL 6.5 5.6(L) -  Albumin 3.5 - 5.0 g/dL 2.1(L) 3.3(L) 2.8(L)  AST 15 - 41 U/L 92(H) 22 -  ALT 0 - 44 U/L 73(H) 21 -  Alk Phosphatase 38 - 126 U/L 82 83 -  Total Bilirubin 0.3 - 1.2 mg/dL 1.7(H) 1.2 -  Bilirubin, Direct 0.0 - 0.3 mg/dL - - -   CBC Latest Ref Rng & Units 10/26/2021 10/25/2021 10/01/2021  WBC 4.0 - 10.5 K/uL 10.8(H) 13.0(H) 6.2  Hemoglobin 13.0 - 17.0 g/dL 7.1(L) 8.9(L) 9.1(L)  Hematocrit 39.0 - 52.0 % 23.3(L) 29.2(L)  30.3(L)  Platelets 150 - 400 K/uL 91(L) 97(L) 205   Lipid Panel     Component Value Date/Time   CHOL 147 11/20/2013 1422   TRIG 160 (H) 11/20/2013 1422   HDL 30 (L) 11/20/2013 1422   CHOLHDL 4.9 11/20/2013 1422   VLDL 32 11/20/2013 1422   LDLCALC 85 11/20/2013 1422   Cardiac Panel (last 3 results) No results for input(s): CKTOTAL, CKMB, TROPONINI, RELINDX in the last 72 hours.  HEMOGLOBIN A1C Lab Results  Component Value Date   HGBA1C 5.5 05/23/2020   MPG 111.15 05/23/2020   TSH No results for input(s): TSH in the last 8760 hours. Radiology:  Chest x-ray PA and lateral view 10/25/2021: Comparison 09/30/2021. 1. Clearing of previously noted left upper lobe pneumonia  2. New hazy airspace disease and consolidation at right base suspicious for right basilar pneumonia. Suspect that there is moderate right pleural effusion 3. Cardiomegaly   Cardiac Studies:    Pacemaker in situ: Medtronic advica DR (MRI compatible) dual-chamber pacemaker 10/07/2014   Left  Heart Catheterization 08/19/20:  Prior coronary angiogram: 11/09/2014: Diamondback CSI coronary atherectomy and stenting of the mid LAD-3.0 x 28 mm Promus Premier DES following. Distal dominant circumflex coronary artery following diamondback atherectomy and implantation of a 2.5 x 16 mm Promus Premier DES. Previously placed stents in LAD and CX patent. Mild to moderate diffuse disease in LAD, D1 and D2 and distal dominant Cx. RCA non dominant and severely diseased and small.   Echocardiogram 05/24/2020:    Left ventricular ejection fraction, by estimation, is 60 to 65%. The  left ventricle has normal function. The left ventricle has no regional  wall motion abnormalities. There is severe concentric left ventricular  hypertrophy. Left ventricular diastolic   parameters are consistent with Grade II diastolic dysfunction  (pseudonormalization).    Scheduled  In office pacemaker check 05/24/21:  Single (S)/Dual (D)/BV: Dual. Presenting ASVS. Pacemaker dependant:  No. Underlying A. Fib. AP 14%, VP 3%. AMS Episodes 320.  AT/AF burden 44.6% . Longest 36 hours. Latest 02/13/2021. Today 6 hours and ongoing. HVR 1 = False. Longevity 3 Years. Magnet rate: >85%. Lead measurements: Stable. Histogram: Low (L)/normal (N)/high (H)  Normal. Patient activity Low.    Observations: Normal pacemaker function. Changes: None.   EKG:   EKG 10/26/2021: Atrial fibrillation with rapid ventricular response at rate of 139 bpm, left axis deviation, left anterior fascicular block.  Right bundle branch block.    Scheduled Meds:  apixaban  5 mg Oral BID   atorvastatin  20 mg Oral QHS   calcitRIOL  0.5 mcg Oral Q T,Th,Sa-HD   darbepoetin (ARANESP) injection - DIALYSIS  200 mcg Intravenous Q Thu-HD   diltiazem  120 mg Oral Daily   doxycycline  100 mg Oral Q12H   feeding supplement  237 mL Oral BID BM   feeding supplement (NEPRO CARB STEADY)  237 mL Oral BID BM   furosemide  80 mg Oral Once per day on Sun Mon Wed  Fri   insulin aspart  0-9 Units Subcutaneous TID WC   insulin aspart protamine- aspart  40 Units Subcutaneous BID WC   mouth rinse  15 mL Mouth Rinse BID   metoprolol tartrate  50 mg Oral BID   Continuous Infusions:  albumin human     cefTRIAXone (ROCEPHIN)  IV     PRN Meds:.acetaminophen, oxyCODONE-acetaminophen  Assessment    1.  Atrial fibrillation with rapid ventricular response 2.  Acute on chronic diastolic heart failure 3.  Tricuspid valve vegetation 4.  CAD of the native vessel with other forms of angina pectoris 5.  End-stage renal disease on hemodialysis 6.  Sinus node dysfunction SP permanent pacemaker implantation on 10/08/2014.  Recommendations:    Echocardiogram reviewed, report will be finalized today it was inaccessible to finalize, has a very large 1.2 to 1.3 cm x 1 cm tricuspid valve vegetation very close to the pacemaker lead.  I will set him up for TEE this afternoon.  With regard to heart failure, with dialysis, fluid overload state has improved significantly, leg edema is also improved.  Dyspnea is improved.  From coronary artery disease standpoint, compared to the echocardiogram done previously, EF is now slightly reduced at 45% with global hypokinesis.  Suspect this is probably related to A. fib with RVR.  Will further increase his metoprolol dosage.  We will wait for TEE to be done prior to making changes to his blood pressure medications and control of A. fib with RVR.  We will obtain blood cultures.   Adrian Prows, MD, [facility] 10/27/2021, 11:31 AM Office: [phone] Fax: [phone] Pager: [phone]

## 2021-10-27 NOTE — Progress Notes (Addendum)
[facility]                                                                              Patient Demographics  Alexander [name], is a 60 y.o. male, DOB - 02-Oct-1961, OIN:[id number]  Admit date - 10/25/2021   Admitting Physician Orene Desanctis, DO  Outpatient Primary MD for the patient is Tisovec, [name], MD  Outpatient specialists:   LOS - 2  days   Medical records reviewed and are as summarized below:    Chief Complaint  Patient presents with   Shortness of Breath   Back Pain       Brief summary   Patient is a 60 year old male with ESRD on HD, TTS, paroxysmal A. fib on Eliquis, pacemaker, CAD status post stent, diastolic CHF, PVD with right BKA, prior history of GI bleed, diabetes mellitus type 2 presented with increasing shortness of breath and cough.  Per patient he was diagnosed with pneumonia in early November and completed course of antibiotics.  However he has not felt back to baseline since.  Has had persistent shortness of breath and coughing, fatigue.  Also noted some anterior and right lateral sided rib pain worse with coughing and worsening left lower extremity edema.  He did complete his HD last session prior to admission. In ED, patient was hypoxic down to 80%, was put on 2 L BNP 1000 181.  Chest x-ray showed right basilar pneumonia, he was placed on IV Rocephin and doxycycline.   Assessment & Plan    Principal Problem: Acute respiratory failure with hypoxia, multifactorial, acute on chronic diastolic CHF, community-acquired pneumonia, paroxysmal A. fib with RVR ([place]) -Patient was hypoxic in ED, sats 80%, placed on 2 L O2 -Chest x-ray showed clearing of prior LUL pneumonia, new hazy airspace disease and consolidation at the right base suspicion for right basilar pneumonia, moderate right pleural effusion, cardiomegaly -Overall improving, weaned off of O2, sats 100% on room air, fluid overload, peripheral edema improving.  Underwent HD.  Active  Problems: Acute on chronic diastolic CHF (congestive heart failure) ([place]) -Suspect worsened due to pneumonia and A. fib with RVR, has ESRD on HD -Nephrology following, HD for volume overload  ?  Infective endocarditis -2D echo per Dr. [name] showed large 1.2x 1.3 x 1 cm tricuspid valve vegetation close to pacemaker lead (official read pending) -Plan for TEE today, will consult ID, currently on doxycycline and IV Rocephin for pneumonia Addendum :4:45pm D/w Dr [name], large vegetation attached to TV, additional vegetation in RA - I had already consulted ID, d/w Dr Laurice Record, blood cultures sent, will await rec's   Paroxysmal atrial fibrillation with RVR -Heart rate controlled, continue eliquis   Right lung pneumonia -Chest x-ray showed consolidation at the right base -Continue doxycycline, IV Rocephin, will follow recommendations per ID   ESRD on hemodialysis, TTS -Nephrology consulted, underwent HD per his schedule on 12/15    Diabetes mellitus type II, IDDM with end stage renal disease ([place]) -Hemoglobin A1c 7.8 on 11/5 -Patient has been n.p.o. today, will make changes tomorrow.  For now continue NovoLog 70/30 40 units twice daily, sliding scale insulin  PVD (peripheral vascular disease) ([place]), -Has history of right BKA   CAD (coronary artery disease), native coronary artery -Currently no acute chest pain, continue metoprolol, Lipitor, eliquis    Pacemaker: Medtronic Advisa DR MRI [id number] Dual chamber pacemaker 10/08/2014  Anemia with thrombocytopenia (HCC) -Platelets 97K, follow counts closely -Follow CBC    Hyponatremia -Possibly due to fluid overload, continue current management, will repeat BMET in a.m. after HD  Obesity Estimated body mass index is 32.11 kg/m as calculated from the following:   Height as of this encounter: 6' (1.829 m).   Weight as of this encounter: 107.4 kg.  Code Status: Full CODE STATUS DVT Prophylaxis:   apixaban (ELIQUIS) tablet 5 mg    Level of Care: Level of care: Telemetry Cardiac Family Communication: Discussed all imaging results, lab results, explained to the patient   Disposition Plan:     Status is: Inpatient  Remains inpatient appropriate because: Acute respiratory failure with hypoxia, ?  Infective endocarditis      Time Spent in minutes 25 minutes  Procedures:    Consultants:   Nephrology, Dr. Jonnie Finner Cardiology, Dr. [name] Infectious disease  Antimicrobials:   Anti-infectives (From admission, onward)    Start     Dose/Rate Route Frequency Ordered Stop   10/27/21 2200  [MAR Hold]  cefTRIAXone (ROCEPHIN) 1 g in sodium chloride 0.9 % 100 mL IVPB        (MAR Hold since Fri 10/27/2021 at 1448.Hold Reason: Transfer to a Procedural area)   1 g 200 mL/hr over 30 Minutes Intravenous Every 24 hours 10/26/21 0108     10/25/21 2200  cefTRIAXone (ROCEPHIN) 2 g in sodium chloride 0.9 % 100 mL IVPB        2 g 200 mL/hr over 30 Minutes Intravenous  Once 10/25/21 2148 10/26/21 0029   10/25/21 2200  [MAR Hold]  doxycycline (VIBRA-TABS) tablet 100 mg        (MAR Hold since Fri 10/27/2021 at 1448.Hold Reason: Transfer to a Procedural area)   100 mg Oral Every 12 hours 10/25/21 2148 11/01/21 2159          Medications  Scheduled Meds:  [MAR Hold] apixaban  5 mg Oral BID   [MAR Hold] atorvastatin  20 mg Oral QHS   [MAR Hold] calcitRIOL  0.5 mcg Oral Q T,Th,Sa-HD   [MAR Hold] darbepoetin (ARANESP) injection - DIALYSIS  200 mcg Intravenous Q Thu-HD   [MAR Hold] diltiazem  120 mg Oral Daily   [MAR Hold] doxycycline  100 mg Oral Q12H   [MAR Hold] feeding supplement  237 mL Oral BID BM   [MAR Hold] feeding supplement (NEPRO CARB STEADY)  237 mL Oral BID BM   [MAR Hold] furosemide  80 mg Oral Once per day on Sun Mon Wed Fri   [facility] Hold] insulin aspart  0-9 Units Subcutaneous TID WC   [MAR Hold] insulin aspart protamine- aspart  40 Units Subcutaneous BID WC   [MAR Hold] mouth rinse  15 mL Mouth Rinse BID    [MAR Hold] metoprolol tartrate  50 mg Oral BID   Continuous Infusions:  sodium chloride 20 mL/hr at 10/27/21 1150   [MAR Hold] cefTRIAXone (ROCEPHIN)  IV     PRN Meds:.      Subjective:   Nyzir Dubois was seen and examined today.  No acute complaints, awaiting for TEE at the time of my examination.  No fevers or chills.  Still has some cough, congestion but no chest pain.  Shortness of  breath and peripheral edema is improving  Objective:   Vitals:   10/26/21 2300 10/27/21 0530 10/27/21 1239 10/27/21 1449  BP:  (!) 92/58 127/73 136/61  Pulse:  88 100 93  Resp:   20 (!) 23  Temp:  98 F (36.7 C) 98.3 F (36.8 C) 98 F (36.7 C)  TempSrc:  Oral Oral Temporal  SpO2:  100% 99% 100%  Weight:  107.4 kg    Height: 6' (1.829 m)       Intake/Output Summary (Last 24 hours) at 10/27/2021 1450 Last data filed at 10/26/2021 1945 Gross per 24 hour  Intake 240 ml  Output 1376 ml  Net -1136 ml     Wt Readings from Last 3 Encounters:  10/27/21 107.4 kg  10/01/21 112.3 kg  09/20/21 117.9 kg   Physical Exam General: Alert and oriented x 3, NAD Cardiovascular: Irregular Respiratory: Diminished breath sound at the bases Gastrointestinal: Soft, nontender, nondistended, NBS Ext right BKA Psych: Normal affect and demeanor, alert and oriented x3    Data Reviewed:  I have personally reviewed following labs and imaging studies  Micro Results Recent Results (from the past 240 hour(s))  Resp Panel by RT-PCR (Flu A&B, Covid) Nasopharyngeal Swab     Status: None   Collection Time: 10/25/21  6:32 PM   Specimen: Nasopharyngeal Swab; Nasopharyngeal(NP) swabs in vial transport medium  Result Value Ref Range Status   SARS Coronavirus 2 by RT PCR NEGATIVE NEGATIVE Final    Comment: (NOTE) SARS-CoV-2 target nucleic acids are NOT DETECTED.  The SARS-CoV-2 RNA is generally detectable in upper respiratory specimens during the acute phase of infection. The lowest concentration of SARS-CoV-2  viral copies this assay can detect is 138 copies/mL. A negative result does not preclude SARS-Cov-2 infection and should not be used as the sole basis for treatment or other patient management decisions. A negative result may occur with  improper specimen collection/handling, submission of specimen other than nasopharyngeal swab, presence of viral mutation(s) within the areas targeted by this assay, and inadequate number of viral copies(<138 copies/mL). A negative result must be combined with clinical observations, patient history, and epidemiological information. The expected result is Negative.  Fact Sheet for Patients:  EntrepreneurPulse.com.au  Fact Sheet for Healthcare Providers:  IncredibleEmployment.be  This test is no t yet approved or cleared by the [place] FDA and  has been authorized for detection and/or diagnosis of SARS-CoV-2 by FDA under an Emergency Use Authorization (EUA). This EUA will remain  in effect (meaning this test can be used) for the duration of the COVID-19 declaration under Section 564(b)(1) of the Act, 21 U.S.C.section 360bbb-3(b)(1), unless the authorization is terminated  or revoked sooner.       Influenza A by PCR NEGATIVE NEGATIVE Final   Influenza B by PCR NEGATIVE NEGATIVE Final    Comment: (NOTE) The Xpert Xpress SARS-CoV-2/FLU/RSV plus assay is intended as an aid in the diagnosis of influenza from Nasopharyngeal swab specimens and should not be used as a sole basis for treatment. Nasal washings and aspirates are unacceptable for Xpert Xpress SARS-CoV-2/FLU/RSV testing.  Fact Sheet for Patients: EntrepreneurPulse.com.au  Fact Sheet for Healthcare Providers: IncredibleEmployment.be  This test is not yet approved or cleared by the [place] FDA and has been authorized for detection and/or diagnosis of SARS-CoV-2 by FDA under an Emergency Use Authorization  (EUA). This EUA will remain in effect (meaning this test can be used) for the duration of the COVID-19 declaration under Section 564(b)(1) of the  Act, 21 U.S.C. section 360bbb-3(b)(1), unless the authorization is terminated or revoked.  Performed at [facility], [place] [address], [place], [place] [address]     Radiology Reports DG Chest 2 View  Result Date: 10/25/2021 CLINICAL DATA:  Shortness of breath EXAM: CHEST - 2 VIEW COMPARISON:  09/30/2021 FINDINGS: Left-sided pacing device as before. Cardiomegaly. Suspicion of moderate right pleural effusion. Hazy airspace disease and consolidation at the right base. Aortic atherosclerosis. No pneumothorax. IMPRESSION: 1. Clearing of previously noted left upper lobe pneumonia 2. New hazy airspace disease and consolidation at right base suspicious for right basilar pneumonia. Suspect that there is moderate right pleural effusion 3. Cardiomegaly Electronically Signed   By: Donavan Foil M.D.   On: 10/25/2021 19:28   DG Chest Port 1 View  Result Date: 09/30/2021 CLINICAL DATA:  End-stage renal disease.  Pain. EXAM: PORTABLE CHEST 1 VIEW COMPARISON:  September 16, 2021 chest x-ray and November 5th 2022 CT scan of the chest. FINDINGS: The opacity in the left apex remains. Stable cardiomegaly and pacemaker. Stable dialysis catheter. No other interval changes or acute abnormalities. IMPRESSION: 1. Persistent opacity in the left upper lobe favored 2 represent pneumonia on the recent chest x-ray and CT scan. Recommend follow-up CT imaging after completing treatment, in approximately 3 months, to confirm resolution. Electronically Signed   By: Dorise Bullion III M.D.   On: 09/30/2021 14:49    Lab Data:  CBC: Recent Labs  Lab 10/25/21 1828 10/26/21 1055  WBC 13.0* 10.8*  NEUTROABS 11.4*  --   HGB 8.9* 7.1*  HCT 29.2* 23.3*  MCV 88.8 87.9  PLT 97* 91*   Basic Metabolic Panel: Recent Labs  Lab 10/25/21 1828 10/26/21 1055  NA 127* 130*  K 3.8  3.6  CL 87* 92*  CO2 25 27  GLUCOSE 133* 148*  BUN 42* 36*  CREATININE 4.55* 3.65*  CALCIUM 7.9* 7.3*  PHOS  --  4.3   GFR: Estimated Creatinine Clearance: 26.6 mL/min (A) (by C-G formula based on SCr of 3.65 mg/dL (H)). Liver Function Tests: Recent Labs  Lab 10/25/21 1828  AST 92*  ALT 73*  ALKPHOS 82  BILITOT 1.7*  PROT 6.5  ALBUMIN 2.1*   No results for input(s): LIPASE, AMYLASE in the last 168 hours. No results for input(s): AMMONIA in the last 168 hours. Coagulation Profile: No results for input(s): INR, PROTIME in the last 168 hours. Cardiac Enzymes: No results for input(s): CKTOTAL, CKMB, CKMBINDEX, TROPONINI in the last 168 hours. BNP (last 3 results) No results for input(s): PROBNP in the last 8760 hours. HbA1C: No results for input(s): HGBA1C in the last 72 hours. CBG: Recent Labs  Lab 10/26/21 1131 10/26/21 2147 10/27/21 0737 10/27/21 1059 10/27/21 1341  GLUCAP 155* 199* 177* 213* 157*   Lipid Profile: No results for input(s): CHOL, HDL, LDLCALC, TRIG, CHOLHDL, LDLDIRECT in the last 72 hours. Thyroid Function Tests: No results for input(s): TSH, T4TOTAL, FREET4, T3FREE, THYROIDAB in the last 72 hours. Anemia Panel: No results for input(s): VITAMINB12, FOLATE, FERRITIN, TIBC, IRON, RETICCTPCT in the last 72 hours. Urine analysis:    Component Value Date/Time   COLORURINE YELLOW 05/23/2020 2057   APPEARANCEUR CLEAR 05/23/2020 2057   LABSPEC 1.008 05/23/2020 2057   PHURINE 5.0 05/23/2020 2057   GLUCOSEU 50 (A) 05/23/2020 2057   HGBUR LARGE (A) 05/23/2020 2057   BILIRUBINUR NEGATIVE 05/23/2020 2057   BILIRUBINUR neg 10/01/2014 1445   KETONESUR NEGATIVE 05/23/2020 2057   PROTEINUR 100 (A)  05/23/2020 2057   UROBILINOGEN 0.2 10/01/2014 1445   UROBILINOGEN 0.2 09/30/2014 2051   NITRITE NEGATIVE 05/23/2020 2057   LEUKOCYTESUR NEGATIVE 05/23/2020 2057     Danissa Rundle M.D. [facility] 10/27/2021, 2:50 PM  Available via Epic secure chat  7am-7pm After 7 pm, please refer to night coverage provider listed on amion.

## 2021-10-27 NOTE — TOC Initial Note (Signed)
Transition of Care [facility]) - Initial/Assessment Note    Patient Details  Name: Alexander [name] MRN: [id number] Date of Birth: 04/18/1961  Transition [facility] CM/SW Contact:    Bethena Roys, RN Phone Number: 10/27/2021, 3:53 PM  Clinical Narrative:  Risk for readmission assessment completed. Prior to arrival patient was from home-ESRD TTS HD. Patient has durable medical equipment wheelchair, rolling walker and bedside commode. As the patient progresses Case Manager will continue to follow for additional transition of care needs.                 Expected Discharge Plan: Home/Self Care Barriers to Discharge: Continued Medical Work up  Expected Discharge Plan and Services Expected Discharge Plan: Home/Self Care In-house Referral: NA Discharge Planning Services: CM Consult   Living arrangements for the past 2 months: Single Family Home                    Prior Living Arrangements/Services Living arrangements for the past 2 months: Single Family Home Lives with:: Spouse Patient language and need for interpreter reviewed:: Yes Do you feel safe going back to the place where you live?: Yes      Need for Family Participation in Patient Care: Yes (Comment) Care giver support system in place?: Yes (comment)   Criminal Activity/Legal Involvement Pertinent to Current Situation/Hospitalization: No - Comment as needed  Activities of Daily Living Home Assistive Devices/Equipment: Prosthesis, Wheelchair, Environmental consultant (specify type), Crutches ADL Screening (condition at time of admission) Patient's cognitive ability adequate to safely complete daily activities?: No Is the patient deaf or have difficulty hearing?: No Does the patient have difficulty seeing, even when wearing glasses/contacts?: No Does the patient have difficulty concentrating, remembering, or making decisions?: No Patient able to express need for assistance with ADLs?: Yes Does the patient have difficulty dressing  or bathing?: Yes Independently performs ADLs?: No Communication: Independent Dressing (OT): Needs assistance Is this a change from baseline?: Pre-admission baseline Grooming: Independent Feeding: Independent Bathing: Needs assistance Is this a change from baseline?: Pre-admission baseline Toileting: Needs assistance Is this a change from baseline?: Pre-admission baseline In/Out Bed: Needs assistance Is this a change from baseline?: Pre-admission baseline Walks in Home: Independent with device (comment) (prosthesis) Does the patient have difficulty walking or climbing stairs?: Yes Weakness of Legs: Both Weakness of Arms/Hands: Both  Permission Sought/Granted Permission sought to share information with : Family Supports, Case Manager                Emotional Assessment Appearance:: Appears stated age       Alcohol / Substance Use: Not Applicable Psych Involvement: No (comment)  Admission diagnosis:  Atrial fibrillation with RVR ([place]) [I48.91] Community acquired pneumonia of right lung, unspecified part of lung [J18.9] Patient Active Problem List   Diagnosis Date Noted   Thrombocytopenia ([place]) 10/26/2021   Hyponatremia 10/26/2021   Acute on chronic diastolic CHF (congestive heart failure) ([place]) 10/26/2021   Atrial fibrillation with RVR ([place]) 10/25/2021   Symptomatic anemia 09/30/2021   ESRD (end stage renal disease) ([place]) 09/30/2021   PAF (paroxysmal atrial fibrillation) ([place]) 09/30/2021   NSVT (nonsustained ventricular tachycardia)    Pancytopenia ([place])    Acute blood loss anemia 07/18/2020   Hemodialysis patient ([place]) 06/04/2020   Necrotizing glomerulonephritis 06/04/2020   Heme positive stool    Melena    Upper GI bleeding 05/23/2020   Acute GI bleeding 05/23/2020   Acute hematogenous osteomyelitis of right foot ([place]) 03/11/2020   Gangrene of  right foot ([place])    Diabetic foot ulcer associated with type 2 diabetes mellitus ([place]) 02/19/2020   Heel ulcer ([place])  11/05/2019   Atherosclerotic PVD with ulceration ([place]) 09/09/2017   Essential hypertension, benign 02/07/2015   Renal insufficiency 02/07/2015   Post PTCA 11/09/2014   S/P PTCA (percutaneous transluminal coronary angioplasty) 11/09/2014   Pacemaker: Apollo DR MRI [id number] Dual chamber pacemaker 10/08/2014 10/08/2014   Encounter for care of pacemaker 10/08/2014   Sinus node dysfunction ([place]) 10/08/2014   Cardiac asystole ([place]) 10/07/2014   Cardiac syncope 10/07/2014   Diabetes mellitus with stage 3 chronic kidney disease ([place]) 10/07/2014   CAD (coronary artery disease), native coronary artery 10/07/2014   Diabetes mellitus due to underlying condition without complications ([place]) 58/07/9832   Anemia, unspecified 05/25/2014   Syncope 04/30/2014   Atherosclerosis of native arteries of the extremities with gangrene ([place]) 12/16/2013   Volume overload 10/30/2013   Acute renal failure ([place]) 10/28/2013   PVD (peripheral vascular disease) ([place]) 10/27/2013   Leukocytosis 10/21/2013   Anemia 10/21/2013   Osteomyelitis of left foot ([place]) 08/19/2013   Spinal stenosis, lumbar region, with neurogenic claudication 06/12/2012    Class: Diagnosis of   Diabetes mellitus with end stage renal disease ([place]) 03/15/2009   ALCOHOL ABUSE 03/15/2009   TOBACCO USE 03/15/2009   HYPERTENSION 03/15/2009   PCP:  [name], MD Pharmacy:   [facility], [place] [address] Phone: [phone] Fax: [phone]  Readmission Risk Interventions Readmission Risk Prevention Plan 10/27/2021 05/26/2020 03/15/2020  Transportation Screening Complete Complete Complete  PCP or Specialist Appt within 5-7 Days - - Complete  PCP or Specialist Appt within 3-5 Days Complete - -  Home Care Screening - - Complete  Medication Review (RN CM) - - Complete  HRI or Home Care Consult Complete - -  Social Work Consult for [place] Planning/Counseling Complete - -  Palliative Care Screening Not Applicable - -  Medication Review Press photographer) Complete Complete -  PCP or Specialist appointment within 3-5 days of discharge - Complete -  [place] or [place] - Complete -  SW Recovery Care/Counseling Consult - Complete -  Palliative Care Screening - Not Applicable -  [place] - Not Applicable -  Some recent data might be hidden

## 2021-10-27 NOTE — Progress Notes (Signed)
[facility] Progress Note   Subjective:  Completed HD yesterday net UF 1.3L Breathing better today, still some coughing   Objective Vitals:   10/26/21 1941 10/26/21 2208 10/26/21 2300 10/27/21 0530  BP: 104/62 118/70  (!) 92/58  Pulse:  (!) 110  88  Resp: 20 18    Temp: 97.8 F (36.6 C)   98 F (36.7 C)  TempSrc: Oral   Oral  SpO2: 95% 97%  100%  Weight:    107.4 kg  Height:   6' (1.829 m)      Additional Objective Labs: Basic Metabolic Panel: Recent Labs  Lab 10/25/21 1828 10/26/21 1055  NA 127* 130*  K 3.8 3.6  CL 87* 92*  CO2 25 27  GLUCOSE 133* 148*  BUN 42* 36*  CREATININE 4.55* 3.65*  CALCIUM 7.9* 7.3*  PHOS  --  4.3   CBC: Recent Labs  Lab 10/25/21 1828 10/26/21 1055  WBC 13.0* 10.8*  NEUTROABS 11.4*  --   HGB 8.9* 7.1*  HCT 29.2* 23.3*  MCV 88.8 87.9  PLT 97* 91*   Blood Culture    Component Value Date/Time   SDES BONE RIGHT FOOT WOUND 01/26/2020 1026   SPECREQUEST A 01/26/2020 1026   CULT  01/26/2020 1026    FEW DIPHTHEROIDS(CORYNEBACTERIUM SPECIES) FEW STREPTOCOCCUS MITIS/ORALIS Standardized susceptibility testing for this organism is not available. FOR DIPHTHEROIDS  (CORYNBACTERIUM SPECIES) NO ANAEROBES ISOLATED Performed at [facility], [place] [address] [place], [place] [address]    REPTSTATUS 02/01/2020 FINAL 01/26/2020 1026     Physical Exam General: Well appearing, nad  Heart: RRR No m,r,g  Lungs: clear bilaterally  Abdomen: soft non-tender  Extremities: 1-2+ pitting edema LLE  Dialysis Access: RUE AVF +bruit   Medications:  sodium chloride 20 mL/hr at 10/27/21 1145   cefTRIAXone (ROCEPHIN)  IV      apixaban  5 mg Oral BID   atorvastatin  20 mg Oral QHS   calcitRIOL  0.5 mcg Oral Q T,Th,Sa-HD   darbepoetin (ARANESP) injection - DIALYSIS  200 mcg Intravenous Q Thu-HD   diltiazem  120 mg Oral Daily   doxycycline  100 mg Oral Q12H   feeding supplement  237 mL Oral BID BM   feeding supplement  (NEPRO CARB STEADY)  237 mL Oral BID BM   furosemide  80 mg Oral Once per day on Sun Mon Wed Fri   insulin aspart  0-9 Units Subcutaneous TID WC   insulin aspart protamine- aspart  40 Units Subcutaneous BID WC   mouth rinse  15 mL Mouth Rinse BID   metoprolol tartrate  50 mg Oral BID    Dialysis Orders:  GO TTS  4h 26min  108.5kg   400/800   2/2.5 bath  TDC   AVF RUA Hep none  - mircera 225 last 12/1 - calcitriol 0.5 ug tiw    Assessment/Plan: PNA - w/ cough, mild SOB, RLL infiltrate on CXR. Started on IV abx ESRD - on HD TTS. Continue on schedule. Next HD 12/17.  Volume - + LLE edema, probably losing body wt (hx poor appetite for 1-2 mos). Will have lower dry weight at discharge.  HTN - takes only metropolol, which may be for afib Afib/ RVR - HR 100-120 range here, per pmd, on BB, Cardizem  EF 45%,  Large tricuspid valve veg on Echo 12/15 --For TEE per Dr. [name]  DM2 - on insulin Anemia ckd - Hb 8.9>7.1 , due to esa, ordering darbe  200 weekly on Thursday -dosed on 12/15.  MBD ckd - Corr Ca/Phos ok.  cont vdra  Lynnda Child PA-C [facility] 10/27/2021,11:50 AM

## 2021-10-27 NOTE — Progress Notes (Signed)
Subjective:  Symptoms of dyspnea has improved. C/O chest pain on the right with coughing. Leg edema has improved.   Intake/Output from previous day:  I/O last 3 completed shifts: In: 580 [P.O.:480; IV Piggyback:100] Out: 2026 [Urine:650; Other:1376] No intake/output data recorded.  Blood pressure (!) 92/58, pulse 88, temperature 98 F (36.7 C), temperature source Oral, resp. rate 18, height 6' (1.829 m), weight 107.4 kg, SpO2 100 %. Physical Exam Constitutional:      General: He is not in acute distress.    Appearance: He is obese.  Eyes:     Extraocular Movements: Extraocular movements intact.  Neck:     Vascular: No carotid bruit or JVD.  Cardiovascular:     Rate and Rhythm: Normal rate and regular rhythm.     Pulses: Intact distal pulses.          Carotid pulses are 2+ on the right side and 2+ on the left side.      Femoral pulses are 2+ on the right side and 2+ on the left side.       Right popliteal pulse not accessible.       Dorsalis pedis pulses are 1+ on the left side. Right dorsalis pedis pulse not accessible.       Posterior tibial pulses are 0 on the left side.     Heart sounds: Normal heart sounds. No murmur heard.   No gallop.  Pulmonary:     Effort: Pulmonary effort is normal.     Breath sounds: Normal breath sounds.  Abdominal:     General: Bowel sounds are normal.     Palpations: Abdomen is soft.  Musculoskeletal:        General: Deformity (Right below-knee amputation) present.     Cervical back: Neck supple.     Left lower leg: Edema (1+ pitting edema) present.  Skin:    General: Skin is warm.     Capillary Refill: Capillary refill takes less than 2 seconds.  Neurological:     General: No focal deficit present.     Mental Status: He is oriented to person, place, and time.    Lab Results: BMP BNP (last 3 results) Recent Labs    10/25/21 1830  BNP 1,181.3*    ProBNP (last 3 results) No results for input(s): PROBNP in the last 8760 hours. BMP  Latest Ref Rng & Units 10/26/2021 10/25/2021 10/01/2021  Glucose 70 - 99 mg/dL 148(H) 133(H) 161(H)  BUN 8 - 23 mg/dL 36(H) 42(H) 6(L)  Creatinine 0.61 - 1.24 mg/dL 3.65(H) 4.55(H) 1.79(H)  Sodium 135 - 145 mmol/L 130(L) 127(L) 137  Potassium 3.5 - 5.1 mmol/L 3.6 3.8 3.4(L)  Chloride 98 - 111 mmol/L 92(L) 87(L) 98  CO2 22 - 32 mmol/L _0 Calcium 8.9 - 10.3 mg/dL 7.3(L) 7.9(L) 8.7(L)   Hepatic Function Latest Ref Rng & Units 10/25/2021 08/19/2020 07/21/2020  Total Protein 6.5 - 8.1 g/dL 6.5 5.6(L) -  Albumin 3.5 - 5.0 g/dL 2.1(L) 3.3(L) 2.8(L)  AST 15 - 41 U/L 92(H) 22 -  ALT 0 - 44 U/L 73(H) 21 -  Alk Phosphatase 38 - 126 U/L 82 83 -  Total Bilirubin 0.3 - 1.2 mg/dL 1.7(H) 1.2 -  Bilirubin, Direct 0.0 - 0.3 mg/dL - - -   CBC Latest Ref Rng & Units 10/26/2021 10/25/2021 10/01/2021  WBC 4.0 - 10.5 K/uL 10.8(H) 13.0(H) 6.2  Hemoglobin 13.0 - 17.0 g/dL 7.1(L) 8.9(L) 9.1(L)  Hematocrit 39.0 - 52.0 % 23.3(L) 29.2(L)  30.3(L)  Platelets 150 - 400 K/uL 91(L) 97(L) 205   Lipid Panel     Component Value Date/Time   CHOL 147 11/20/2013 1422   TRIG 160 (H) 11/20/2013 1422   HDL 30 (L) 11/20/2013 1422   CHOLHDL 4.9 11/20/2013 1422   VLDL 32 11/20/2013 1422   LDLCALC 85 11/20/2013 1422   Cardiac Panel (last 3 results) No results for input(s): CKTOTAL, CKMB, TROPONINI, RELINDX in the last 72 hours.  HEMOGLOBIN A1C Lab Results  Component Value Date   HGBA1C 5.5 05/23/2020   MPG 111.15 05/23/2020   TSH No results for input(s): TSH in the last 8760 hours. Radiology:  Chest x-ray PA and lateral view 10/25/2021: Comparison 09/30/2021. 1. Clearing of previously noted left upper lobe pneumonia  2. New hazy airspace disease and consolidation at right base suspicious for right basilar pneumonia. Suspect that there is moderate right pleural effusion 3. Cardiomegaly   Cardiac Studies:    Pacemaker in situ: Medtronic advica DR (MRI compatible) dual-chamber pacemaker 10/07/2014   Left  Heart Catheterization 08/19/20:  Prior coronary angiogram: 11/09/2014: Diamondback CSI coronary atherectomy and stenting of the mid LAD-3.0 x 28 mm Promus Premier DES following. Distal dominant circumflex coronary artery following diamondback atherectomy and implantation of a 2.5 x 16 mm Promus Premier DES. Previously placed stents in LAD and CX patent. Mild to moderate diffuse disease in LAD, D1 and D2 and distal dominant Cx. RCA non dominant and severely diseased and small.   Echocardiogram 05/24/2020:    Left ventricular ejection fraction, by estimation, is 60 to 65%. The  left ventricle has normal function. The left ventricle has no regional  wall motion abnormalities. There is severe concentric left ventricular  hypertrophy. Left ventricular diastolic   parameters are consistent with Grade II diastolic dysfunction  (pseudonormalization).    Scheduled  In office pacemaker check 05/24/21:  Single (S)/Dual (D)/BV: Dual. Presenting ASVS. Pacemaker dependant:  No. Underlying A. Fib. AP 14%, VP 3%. AMS Episodes 320.  AT/AF burden 44.6% . Longest 36 hours. Latest 02/13/2021. Today 6 hours and ongoing. HVR 1 = False. Longevity 3 Years. Magnet rate: >85%. Lead measurements: Stable. Histogram: Low (L)/normal (N)/high (H)  Normal. Patient activity Low.    Observations: Normal pacemaker function. Changes: None.   EKG:   EKG 10/26/2021: Atrial fibrillation with rapid ventricular response at rate of 139 bpm, left axis deviation, left anterior fascicular block.  Right bundle branch block.    Scheduled Meds:  apixaban  5 mg Oral BID   atorvastatin  20 mg Oral QHS   calcitRIOL  0.5 mcg Oral Q T,Th,Sa-HD   darbepoetin (ARANESP) injection - DIALYSIS  200 mcg Intravenous Q Thu-HD   diltiazem  120 mg Oral Daily   doxycycline  100 mg Oral Q12H   feeding supplement  237 mL Oral BID BM   feeding supplement (NEPRO CARB STEADY)  237 mL Oral BID BM   furosemide  80 mg Oral Once per day on Sun Mon Wed  Fri   insulin aspart  0-9 Units Subcutaneous TID WC   insulin aspart protamine- aspart  40 Units Subcutaneous BID WC   mouth rinse  15 mL Mouth Rinse BID   metoprolol tartrate  50 mg Oral BID   Continuous Infusions:  albumin human     cefTRIAXone (ROCEPHIN)  IV     PRN Meds:.acetaminophen, oxyCODONE-acetaminophen  Assessment    1.  Atrial fibrillation with rapid ventricular response 2.  Acute on chronic diastolic heart failure 3.  Tricuspid valve vegetation 4.  CAD of the native vessel with other forms of angina pectoris 5.  End-stage renal disease on hemodialysis 6.  Sinus node dysfunction SP permanent pacemaker implantation on 10/08/2014.  Recommendations:    Echocardiogram reviewed, report will be finalized today it was inaccessible to finalize, has a very large 1.2 to 1.3 cm x 1 cm tricuspid valve vegetation very close to the pacemaker lead.  I will set him up for TEE this afternoon.  With regard to heart failure, with dialysis, fluid overload state has improved significantly, leg edema is also improved.  Dyspnea is improved.  From coronary artery disease standpoint, compared to the echocardiogram done previously, EF is now slightly reduced at 45% with global hypokinesis.  Suspect this is probably related to A. fib with RVR.  Will further increase his metoprolol dosage.  We will wait for TEE to be done prior to making changes to his blood pressure medications and control of A. fib with RVR.  We will obtain blood cultures.   Adrian Prows, MD, [facility] 10/27/2021, 11:31 AM Office: [phone] Fax: [phone] Pager: [phone]

## 2021-10-27 NOTE — CV Procedure (Signed)
TEE: Under moderate sedation, TEE was performed without complications: LV: Global hypo. LVEF 40% to 45%. RV: Normal LA: Dilated. Left atrial appendage: Normal without thrombus. Normal function. Inter atrial septum is intact without defect.  RA: Dilated. Pacemaker lead noted and there ia a 1 cm x1cm vegetation attached to the RA pacemaker lead.  MV: Normal. Moderate  MR. TV: Flail septal leaflet. Large vegetation attached to the TV measuring 2x1 cm. The Vegetation also appears to be attached to the RV pacemaker lead.  Severe TR. RV systolic pressure mild to moderately elevated. AV: Normal. No AI or AS. PV: Normal. Trace PI.  Thoracic and ascending aorta: Mild diffuse plaque and atheromatous changes.  Conscious sedation protocol was followed, I personally administered conscious sedation and monitored the patient. Patient received 2 milligrams of Versed and 25 mcg Fentanyl . Patient tolerated the procedure well and there was no complication from conscious sedation. Time administered was 15.   Adrian Prows, MD, [facility] 10/27/2021, 4:39 PM Office: [phone] Fax: [phone] Pager: [phone]

## 2021-10-27 NOTE — Progress Notes (Signed)
Pharmacy Antibiotic Note  Alexander [name] is a 60 y.o. male admitted on 10/25/2021 with SOB and found to have vegetation on TV close to PPM lead. Pharmacy has been consulted for vancomycin and cefepime dosing. Pt is ESRD on HD TTS, last session was 12/15. Blood cultures sent and pending.  Plan: Cefepime 2g IV x1 now then qHD TTS Vancomycin 2500mg  IV x1 then 1000mg  qHD TTS Follow HD schedule, Cx, ID recs   Height: 6' (182.9 cm) Weight: 107.4 kg (236 lb 12.4 oz) IBW/kg (Calculated) : 77.6  Temp (24hrs), Avg:98.1 F (36.7 C), Min:97.8 F (36.6 C), Max:98.4 F (36.9 C)  Recent Labs  Lab 10/25/21 1828 10/26/21 1055  WBC 13.0* 10.8*  CREATININE 4.55* 3.65*    Estimated Creatinine Clearance: 26.6 mL/min (A) (by C-G formula based on SCr of 3.65 mg/dL (H)).    Allergies  Allergen Reactions   Cytoxan [Cyclophosphamide] Other (See Comments)    Pancytopenia   Keflex [Cephalexin] Nausea And Vomiting   Tylenol [Acetaminophen] Other (See Comments)    Makes the patient sweat   Pletal [Cilostazol] Palpitations and Other (See Comments)    "Can hear heart beating loudly".    Antimicrobials this admission: Ceftriaxone x1 12/14  Doxycycline 12/14 >> 12/16 Vancomycin 12/16 >> Cefepime 12/16 >>  Microbiology results: 12/16 BCx: sent  Thank you for allowing pharmacy to be a part of this patients care.   Arrie Senate, PharmD, BCPS, [facility] Clinical Pharmacist [phone] Please check AMION for all [place] numbers 10/27/2021

## 2021-10-27 NOTE — Progress Notes (Signed)
Initial Nutrition Assessment  DOCUMENTATION CODES:   Not applicable  INTERVENTION:   Nepro Shake po BID, each supplement provides 425 kcal and 19 grams protein. Renal MVI daily.  NUTRITION DIAGNOSIS:   Increased nutrient needs related to chronic illness (ESRD on HD) as evidenced by estimated needs.  GOAL:   Patient will meet greater than or equal to 90% of their needs  MONITOR:   PO intake, Supplement acceptance, Labs, Weight trends  REASON FOR ASSESSMENT:   Malnutrition Screening Tool    ASSESSMENT:   60 yo male admitted with acute respiratory failure, acute on chronic CHF, CAP, PAF with RVR. PMH includes ESRD on HD, PAF, pacemaker, CAD, CHF, PVD, R BKA, DM-2.  Patient currently out of room for procedure.  Questionable infective endocarditis.  Per chart review, he was recently treated for PNA and has been eating poorly for the past 1-2 months. Suspect poor intake r/t decreased appetite with PNA. Since admission, intake of meals has been good, 50-100% meal intakes documented.  Patient with increased nutrient needs r/t ESRD on HD. Patient would benefit from a PO supplement to ensure adequate protein and calorie intake.   Labs reviewed. Na 130, ionized calcium 1.06 CBG: [phone]  Medications reviewed and include calcitriol, Aranesp, Novolog, Lasix.  Weight history reviewed. Weight is down by 9% within the past 3 months, which is a significant amount. Given reported poor intake, suspect this is dry weight loss. Suspect malnutrition; unable to obtain enough information at this time for identification of malnutrition.   NUTRITION - FOCUSED PHYSICAL EXAM:  Unable to complete, patient out of room  Diet Order:   Diet Order             Diet NPO time specified  Diet effective now                   EDUCATION NEEDS:   Not appropriate for education at this time  Skin:  Skin Assessment: Reviewed RN Assessment  Last BM:  no BM documented  Height:   Ht  Readings from Last 1 Encounters:  10/26/21 6' (1.829 m)    Weight:   Wt Readings from Last 1 Encounters:  10/27/21 107.4 kg    BMI:  Body mass index is 34.3 kg/m, adjusted for BKA.  Estimated Nutritional Needs:   Kcal:  7902-4097  Protein:  110-130 gm  Fluid:  1 L + UOP    Lucas Mallow, RD, LDN, CNSC Please refer to Amion for contact information.

## 2021-10-27 NOTE — Care Management (Signed)
10-27-21 0964 Case Manager received a consult for medication assistance. Unfortunately, Case Manager cannot assist secondary to patient having insurance. Case Manager will follow for medications of high cost for possible card assistance.

## 2021-10-27 NOTE — Interval H&P Note (Signed)
History and Physical Interval Note:  10/27/2021 4:07 PM  Alexander [name]  has presented today for surgery, with the diagnosis of bacteremia.  The various methods of treatment have been discussed with the patient and family. After consideration of risks, benefits and other options for treatment, the patient has consented to  Procedure(s): TRANSESOPHAGEAL ECHOCARDIOGRAM (TEE) (N/A) as a surgical intervention.  The patient's history has been reviewed, patient examined, no change in status, stable for surgery.  I have reviewed the patient's chart and labs.  Questions were answered to the patient's satisfaction.     Adrian Prows

## 2021-10-27 NOTE — Consult Note (Signed)
°    [place] for Infectious Disease    Date of Admission:  10/25/2021     Reason for Consult: Endocarditis     Referring Physician: Dr Tana Coast  Current antibiotics: Ceftriaxone 12/14-present Doxycycline 12/14-present   ASSESSMENT:    60 y.o. male admitted with:  Endocarditis and Pacemaker infection: Large vegetation on tricuspid valve noted on TTE measuring 1.2x1.3x1 cm and close to pacemaker lead.  TEE done today for further evaluation further notes a vegetation attached to RA pacemaker lead and large vegetation attached to tricuspid valve and likely the RV pacemaker lead.  There is severe TR. Sinus node dysfunction: S/p permanent pacemaker 09/2014. End stage renal disease: On HD. Recent history of pneumonia: Treated with antibiotics but with persistent symptoms of fatigue, cough, poor appetite concerning for subacute endocarditis.  CT chest in November also noted bilateral opacities and peripheral GGO's.  ? Septic emboli Diabetes, PAD Atrial fibrillation with RVR, CHF, CAD  RECOMMENDATIONS:    Change antibiotics to vancomycin and cefepime Follow up blood cultures although this will likely be influenced by 3 days of antibiotics prior to blood cultures being done CT chest to evaluate for pulmonary septic emboli Would consult EP to help manage pacemaker and whether he is a candidate for device extraction.  Would also consult CT surgery given TV vegetation with severe regurgitation on TEE. Will follow.   HPI:    Alexander [name] is a 60 y.o. male with a past medical history of end-stage renal disease on hemodialysis, hypertension, hyperlipidemia, atrial fibrillation, peripheral vascular disease, status post right BKA, status post left TMA, diabetes, CAD, status post pacemaker placement in 2015 who  was admitted 10/25/2021 with increasing shortness of breath and cough.  This was in the setting of a recent diagnosis of pneumonia in early November for which he reports completing a  course of antibiotics (Augmentin and azithromycin).  However, he reported not getting back to his baseline since that time with continued respiratory symptoms as well as fatigue and lack of appetite.  He also reported worsening lower extremity edema.  In the emergency department at admission he was hypoxic and placed on supplemental oxygen.  BNP was elevated and chest x-ray was consistent with right basilar pneumonia.  He was started on antibiotics with ceftriaxone and doxycycline.  He was admitted to the hospitalist service.  He was also found to have a leukocytosis of 13.0 and was afebrile.  Blood cultures were not obtained at admission.  He appears to have improved in terms of his hypoxia and was able to be weaned off of oxygen with stable saturations on room air.  His fluid overload and peripheral edema also improved and he underwent dialysis.  Patient has been dialyzing via a right upper extremity AV fistula but until recently had a tunneled dialysis line in place.  He underwent 2D echocardiogram yesterday (formal interpretation pending) that noted a large 1.2 x 1.3 x 1 cm tricuspid valve vegetation close to his pacemaker lead.  He just underwent transesophageal echocardiogram today with cardiology.  He had blood cultures obtained today on hospital day #3.  We are asked to see for further antibiotic recommendations.  Consult requested by primary team at 3pm when patient was away from room and in procedural area.  Patient remained in procedural area past 5pm so will be formally seen tomorrow.   Raynelle [facility] for Infectious Disease [facility] [phone] pager 10/27/2021, 5:08 PM

## 2021-10-27 NOTE — Care Management Important Message (Signed)
Important Message  Patient Details  Name: Alexander [name] MRN: [id number] Date of Birth: 1960/11/14   Medicare Important Message Given:  Yes     [name] 10/27/2021, 12:02 PM

## 2021-10-28 ENCOUNTER — Inpatient Hospital Stay (HOSPITAL_COMMUNITY): Payer: Medicare Other

## 2021-10-28 DIAGNOSIS — I330 Acute and subacute infective endocarditis: Principal | ICD-10-CM

## 2021-10-28 LAB — CBC
HCT: 24.4 % — ABNORMAL LOW (ref 39.0–52.0)
Hemoglobin: 7.4 g/dL — ABNORMAL LOW (ref 13.0–17.0)
MCH: 26.8 pg (ref 26.0–34.0)
MCHC: 30.3 g/dL (ref 30.0–36.0)
MCV: 88.4 fL (ref 80.0–100.0)
Platelets: 136 10*3/uL — ABNORMAL LOW (ref 150–400)
RBC: 2.76 MIL/uL — ABNORMAL LOW (ref 4.22–5.81)
RDW: 20.5 % — ABNORMAL HIGH (ref 11.5–15.5)
WBC: 16 10*3/uL — ABNORMAL HIGH (ref 4.0–10.5)
nRBC: 0 % (ref 0.0–0.2)

## 2021-10-28 LAB — COMPREHENSIVE METABOLIC PANEL
ALT: 164 U/L — ABNORMAL HIGH (ref 0–44)
AST: 240 U/L — ABNORMAL HIGH (ref 15–41)
Albumin: 2 g/dL — ABNORMAL LOW (ref 3.5–5.0)
Alkaline Phosphatase: 93 U/L (ref 38–126)
Anion gap: 11 (ref 5–15)
BUN: 44 mg/dL — ABNORMAL HIGH (ref 8–23)
CO2: 26 mmol/L (ref 22–32)
Calcium: 7.8 mg/dL — ABNORMAL LOW (ref 8.9–10.3)
Chloride: 94 mmol/L — ABNORMAL LOW (ref 98–111)
Creatinine, Ser: 4.23 mg/dL — ABNORMAL HIGH (ref 0.61–1.24)
GFR, Estimated: 15 mL/min — ABNORMAL LOW (ref >60)
Glucose, Bld: 164 mg/dL — ABNORMAL HIGH (ref 70–99)
Potassium: 4.4 mmol/L (ref 3.5–5.1)
Sodium: 131 mmol/L — ABNORMAL LOW (ref 135–145)
Total Bilirubin: 1.4 mg/dL — ABNORMAL HIGH (ref 0.3–1.2)
Total Protein: 5.6 g/dL — ABNORMAL LOW (ref 6.5–8.1)

## 2021-10-28 LAB — GLUCOSE, CAPILLARY
Glucose-Capillary: 147 mg/dL — ABNORMAL HIGH (ref 70–99)
Glucose-Capillary: 140 mg/dL — ABNORMAL HIGH (ref 70–99)
Glucose-Capillary: 118 mg/dL — ABNORMAL HIGH (ref 70–99)
Glucose-Capillary: 54 mg/dL — ABNORMAL LOW (ref 70–99)
Glucose-Capillary: 161 mg/dL — ABNORMAL HIGH (ref 70–99)
Glucose-Capillary: 48 mg/dL — ABNORMAL LOW (ref 70–99)
Glucose-Capillary: 49 mg/dL — ABNORMAL LOW (ref 70–99)

## 2021-10-28 MED ORDER — DEXTROSE 50 % IV SOLN: 25.0000 g | INTRAVENOUS | Status: AC

## 2021-10-28 MED ORDER — LIDOCAINE-PRILOCAINE 2.5-2.5 % EX CREA
1.0000 "application " | TOPICAL_CREAM | CUTANEOUS | Status: DC | PRN
  Filled 2021-10-28: qty 5

## 2021-10-28 MED ORDER — SODIUM CHLORIDE 0.9 % IV SOLN: 2.0000 g | Freq: Once | INTRAVENOUS | Status: DC

## 2021-10-28 MED ORDER — HEPARIN SODIUM (PORCINE) 1000 UNIT/ML DIALYSIS: 1000.0000 [IU] | INTRAMUSCULAR | Status: DC | PRN

## 2021-10-28 MED ORDER — SODIUM CHLORIDE 0.9 % IV SOLN: 100.0000 mL | INTRAVENOUS | Status: DC | PRN

## 2021-10-28 MED ORDER — ALTEPLASE 2 MG IJ SOLR: 2.0000 mg | Freq: Once | INTRAMUSCULAR | Status: DC | PRN

## 2021-10-28 MED ORDER — LIDOCAINE HCL (PF) 1 % IJ SOLN: 5.0000 mL | INTRAMUSCULAR | Status: DC | PRN

## 2021-10-28 MED ORDER — CALCITRIOL 0.5 MCG PO CAPS: 0.5000 ug | ORAL_CAPSULE | Freq: Once | ORAL | Status: DC

## 2021-10-28 MED ORDER — PENTAFLUOROPROP-TETRAFLUOROETH EX AERO: 1.0000 "application " | INHALATION_SPRAY | CUTANEOUS | Status: DC | PRN

## 2021-10-28 MED ORDER — VANCOMYCIN VARIABLE DOSE PER UNSTABLE RENAL FUNCTION (PHARMACIST DOSING)
Status: DC
Start: 2021-10-28 — End: 2021-11-01

## 2021-10-28 MED ORDER — DEXTROSE 50 % IV SOLN
INTRAVENOUS | Status: AC
  Administered 2021-10-28: 50 mL
  Filled 2021-10-28: qty 50

## 2021-10-28 MED ORDER — GLUCOSE 40 % PO GEL
2.0000 | ORAL | Status: DC
  Filled 2021-10-28: qty 1

## 2021-10-28 MED ORDER — VANCOMYCIN HCL IN DEXTROSE 1-5 GM/200ML-% IV SOLN
1000.0000 mg | Freq: Once | INTRAVENOUS | Status: DC
  Filled 2021-10-28: qty 200

## 2021-10-28 NOTE — Progress Notes (Signed)
[facility] Progress Note   Subjective:  Seen in HD unit, just starting treatment. Feels good this am "better than I've felt in awhile"  Denies cp, dyspnea  TEE yesterday with large vegetation on TV and attached to PM    Objective Vitals:   10/27/21 1727 10/27/21 2052 10/27/21 2213 10/28/21 0437  BP: 116/85 (!) 118/94 114/70 101/64  Pulse:  95 90 81  Resp: 20 20  14   Temp: 98.6 F (37 C) 98.6 F (37 C)  98.6 F (37 C)  TempSrc: Oral Oral  Oral  SpO2: 92% 95%  96%  Weight:    109.9 kg  Height:         Additional Objective Labs: Basic Metabolic Panel: Recent Labs  Lab 10/25/21 1828 10/26/21 1055 10/28/21 0140  NA 127* 130* 131*  K 3.8 3.6 4.4  CL 87* 92* 94*  CO2 25 27 26   GLUCOSE 133* 148* 164*  BUN 42* 36* 44*  CREATININE 4.55* 3.65* 4.23*  CALCIUM 7.9* 7.3* 7.8*  PHOS  --  4.3  --     CBC: Recent Labs  Lab 10/25/21 1828 10/26/21 1055 10/28/21 0140  WBC 13.0* 10.8* 16.0*  NEUTROABS 11.4*  --   --   HGB 8.9* 7.1* 7.4*  HCT 29.2* 23.3* 24.4*  MCV 88.8 87.9 88.4  PLT 97* 91* 136*    Blood Culture    Component Value Date/Time   SDES BONE RIGHT FOOT WOUND 01/26/2020 1026   SPECREQUEST A 01/26/2020 1026   CULT  01/26/2020 1026    FEW DIPHTHEROIDS(CORYNEBACTERIUM SPECIES) FEW STREPTOCOCCUS MITIS/ORALIS Standardized susceptibility testing for this organism is not available. FOR DIPHTHEROIDS  (CORYNBACTERIUM SPECIES) NO ANAEROBES ISOLATED Performed at [facility], [place] [address] [place], [place] [address]    REPTSTATUS 02/01/2020 FINAL 01/26/2020 1026     Physical Exam General: Well appearing, nad  Heart: RRR No m,r,g  Lungs: clear bilaterally  Abdomen: soft non-tender  Extremities: 1-2+ pitting edema LLE  Dialysis Access: RUE AVF +bruit   Medications:  sodium chloride 10 mL/hr at 10/27/21 1845   ceFEPime (MAXIPIME) IV     vancomycin      apixaban  5 mg Oral BID   atorvastatin  20 mg Oral QHS   calcitRIOL  0.5  mcg Oral Q T,Th,Sa-HD   darbepoetin (ARANESP) injection - DIALYSIS  200 mcg Intravenous Q Thu-HD   diltiazem  120 mg Oral Daily   feeding supplement (NEPRO CARB STEADY)  237 mL Oral BID BM   furosemide  80 mg Oral Once per day on Sun Mon Wed Fri   insulin aspart  0-9 Units Subcutaneous TID WC   insulin aspart protamine- aspart  40 Units Subcutaneous BID WC   mouth rinse  15 mL Mouth Rinse BID   metoprolol tartrate  50 mg Oral BID   multivitamin  1 tablet Oral QHS    Dialysis Orders:  GO TTS  4h 29min  108.5kg   400/800   2/2.5 bath  TDC   AVF RUA Hep none  - mircera 225 last 12/1 - calcitriol 0.5 ug tiw    Assessment/Plan: PNA - w/ cough, mild SOB, RLL infiltrate on CXR.  On antibiotics.  Endocarditis - large vegetation on TV, and in RA  attached to pacemaker lead--ID consulted for abx --Now on Vanc/cefepime. Repeat blood cultures ordered.  Cardiology following  ESRD - on HD TTS. Continue on schedule. Next HD 12/17.  Volume - + LLE edema, probably losing body  wt (hx poor appetite for 1-2 mos). Will have lower dry weight at discharge.  HTN - takes only metropolol, which may be for afib Afib/ RVR - Rate controlled, on BB, Cardizem  Echo 12/15 -- EF 45%,  Large tricuspid valve veg , severe TVR - not a good candidate for surgery per Dr. [name]  DM2 - on insulin Anemia ckd - Hb 8.9>7.1 , due to esa, ordering darbe 200 weekly on Thursday -dosed on 12/15.  MBD ckd - Corr Ca/Phos ok.  cont vdra  Lynnda Child PA-C [facility] 10/28/2021,8:55 AM

## 2021-10-28 NOTE — Progress Notes (Signed)
[facility]                                                                              Patient Demographics  Alexander [name], is a 60 y.o. male, DOB - [birth year], JJO:[id number]  Admit date - 10/25/2021   Admitting Physician Orene Desanctis, DO  Outpatient Primary MD for the patient is Tisovec, [name], MD  Outpatient specialists:   LOS - 3  days   Medical records reviewed and are as summarized below:    Chief Complaint  Patient presents with   Shortness of Breath   Back Pain       Brief summary   Patient is a 59 year old male with ESRD on HD, TTS, paroxysmal A. fib on Eliquis, pacemaker, CAD status post stent, diastolic CHF, PVD with right BKA, prior history of GI bleed, diabetes mellitus type 2 presented with increasing shortness of breath and cough.  Per patient he was diagnosed with pneumonia in early November and completed course of antibiotics.  However he has not felt back to baseline since.  Has had persistent shortness of breath and coughing, fatigue.  Also noted some anterior and right lateral sided rib pain worse with coughing and worsening left lower extremity edema.  He did complete his HD last session prior to admission. In ED, patient was hypoxic down to 80%, was put on 2 L BNP 1000 181.  Chest x-ray showed right basilar pneumonia, he was placed on IV Rocephin and doxycycline.   Assessment & Plan    Principal Problem: Acute respiratory failure with hypoxia, multifactorial, acute on chronic diastolic CHF, community-acquired pneumonia, paroxysmal A. fib with RVR ([place]) -Patient was hypoxic in ED, sats 80%, placed on 2 L O2 -Chest x-ray showed clearing of prior LUL pneumonia, new hazy airspace disease and consolidation at the right base suspicion for right basilar pneumonia, moderate right pleural effusion, cardiomegaly  Active Problems: Acute on chronic diastolic CHF (congestive heart failure) ([place]) -Suspect worsened due to pneumonia and A. fib  with RVR, has ESRD on HD -Nephrology following, seen during HD, was having issues with the access.  Treatment was stopped and plan for HD in a.m. and may need vascular evaluation if access issues.    TV Infective endocarditis -2D echo showed large 1.2x 1.3 x 1 cm tricuspid valve vegetation close to pacemaker lead (official read pending) -TEE on 12/16 showed 1x 1 cm vegetation attached to right atrium pacemaker lead.  Large vegetation attached to TEE measuring 2x 1 cm, also appears to be attached to RV pacemaker lead, severe TR.  Per Dr. [name], pacemaker extraction and reimplant of new leads will be a major issue with ongoing infection and high risk of embolization hence recommended medical therapy for now -ID consulted, appreciate recommendations, on IV vancomycin and cefepime.   -CT L-spine showed subacute appearing endplate fractures of Z6-0 and L5 since November 2022, does not appear to have discitis or abscess on CT.  Paroxysmal atrial fibrillation with RVR -Continue Eliquis.  Heart rate controlled.  Right lung pneumonia -Chest x-ray showed consolidation at the right base -Currently on vancomycin and cefepime  ESRD on hemodialysis, TTS -  Underwent HD on 12/15, unsuccessful HD today due to access issues.    Diabetes mellitus type II, IDDM with end stage renal disease ([place]) -Hemoglobin A1c 7.8 on 11/5 -CBG stable, continue current insulin regimen, Novolin 70/30 40 units twice daily, sliding scale insulin     PVD (peripheral vascular disease) ([place]), -Has history of right BKA   CAD (coronary artery disease), native coronary artery -Currently no acute chest pain, continue metoprolol, Lipitor, eliquis    Pacemaker: Medtronic Advisa DR MRI [id number] Dual chamber pacemaker 10/08/2014  Anemia with thrombocytopenia (H[facility]) -Platelets 97K, follow counts closely -Hemoglobin stable 7.4, transfuse if less than 7,  -platelet counts improving 136K    Hyponatremia -Possibly due to fluid overload,  continue current management, -Sodium stable, improving 131   Obesity Estimated body mass index is 32.68 kg/m (pended) as calculated from the following:   Height as of this encounter: 6' (1.829 m).   Weight as of this encounter: (P) 109.3 kg.  Code Status: Full CODE STATUS DVT Prophylaxis:   apixaban (ELIQUIS) tablet 5 mg   Level of Care: Level of care: Telemetry Cardiac Family Communication: Discussed all imaging results, lab results, explained to the patient   Disposition Plan:     Status is: Inpatient  Remains inpatient appropriate because: Acute respiratory failure with hypoxia, ?  Infective endocarditis  Time Spent in minutes 25 minutes  Procedures:  2D echo TEE  Consultants:   Nephrology, Dr. Jonnie Finner Cardiology, Dr. [name] Infectious disease  Antimicrobials:   Anti-infectives (From admission, onward)    Start     Dose/Rate Route Frequency Ordered Stop   10/29/21 1200  vancomycin (VANCOCIN) IVPB 1000 mg/200 mL premix        1,000 mg 200 mL/hr over 60 Minutes Intravenous  Once 10/28/21 1422     10/29/21 1200  ceFEPIme (MAXIPIME) 2 g in sodium chloride 0.9 % 100 mL IVPB        2 g 200 mL/hr over 30 Minutes Intravenous  Once 10/28/21 1422     10/28/21 1414  vancomycin variable dose per unstable renal function (pharmacist dosing)         Does not apply See admin instructions 10/28/21 1422     10/28/21 1200  ceFEPIme (MAXIPIME) 2 g in sodium chloride 0.9 % 100 mL IVPB        2 g 200 mL/hr over 30 Minutes Intravenous Every T-Th-Sa (Hemodialysis) 10/27/21 1717     10/28/21 1200  vancomycin (VANCOCIN) IVPB 1000 mg/200 mL premix        1,000 mg 200 mL/hr over 60 Minutes Intravenous Every T-Th-Sa (Hemodialysis) 10/27/21 1717     10/27/21 2200  cefTRIAXone (ROCEPHIN) 1 g in sodium chloride 0.9 % 100 mL IVPB  Status:  Discontinued        1 g 200 mL/hr over 30 Minutes Intravenous Every 24 hours 10/26/21 0108 10/27/21 1653   10/27/21 1815  ceFEPIme (MAXIPIME) 2 g in sodium  chloride 0.9 % 100 mL IVPB        2 g 200 mL/hr over 30 Minutes Intravenous  Once 10/27/21 1717 10/27/21 1920   10/27/21 1815  vancomycin (VANCOCIN) 2,500 mg in sodium chloride 0.9 % 500 mL IVPB        2,500 mg 250 mL/hr over 120 Minutes Intravenous  Once 10/27/21 1717 10/27/21 2202   10/25/21 2200  cefTRIAXone (ROCEPHIN) 2 g in sodium chloride 0.9 % 100 mL IVPB        2 g 200 mL/hr over 30  Minutes Intravenous  Once 10/25/21 2148 10/26/21 0029   10/25/21 2200  doxycycline (VIBRA-TABS) tablet 100 mg  Status:  Discontinued        100 mg Oral Every 12 hours 10/25/21 2148 10/27/21 1653          Medications  Scheduled Meds:  apixaban  5 mg Oral BID   atorvastatin  20 mg Oral QHS   calcitRIOL  0.5 mcg Oral Q T,Th,Sa-HD   [START ON 10/29/2021] calcitRIOL  0.5 mcg Oral Once   darbepoetin (ARANESP) injection - DIALYSIS  200 mcg Intravenous Q Thu-HD   diltiazem  120 mg Oral Daily   feeding supplement (NEPRO CARB STEADY)  237 mL Oral BID BM   furosemide  80 mg Oral Once per day on Sun Mon Wed Fri   insulin aspart  0-9 Units Subcutaneous TID WC   insulin aspart protamine- aspart  40 Units Subcutaneous BID WC   mouth rinse  15 mL Mouth Rinse BID   metoprolol tartrate  50 mg Oral BID   multivitamin  1 tablet Oral QHS   vancomycin variable dose per unstable renal function (pharmacist dosing)   Does not apply See admin instructions   Continuous Infusions:  [START ON 10/29/2021] sodium chloride     [START ON 10/29/2021] sodium chloride     sodium chloride 10 mL/hr at 10/27/21 1845   ceFEPime (MAXIPIME) IV     [START ON 10/29/2021] ceFEPime (MAXIPIME) IV     vancomycin     [START ON 10/29/2021] vancomycin     PRN Meds:.      Subjective:   Alexander [name] was seen and examined today.  Seen during HD, having access issues.  Explained echo and TEE results to the patient.  No acute complaints, no chest pain or acute shortness of breath.  Objective:   Vitals:   10/28/21 0748 10/28/21  0809 10/28/21 0930 10/28/21 1057  BP: (!) 95/51 94/69 (!) (P) 95/48 103/63  Pulse: 63   86  Resp: 18  (P) 16 16  Temp: 97.9 F (36.6 C)  (P) 98.1 F (36.7 C) 97.7 F (36.5 C)  TempSrc: Oral   Oral  SpO2:    95%  Weight: (P) 109.3 kg     Height:        Intake/Output Summary (Last 24 hours) at 10/28/2021 1510 Last data filed at 10/28/2021 1230 Gross per 24 hour  Intake 600 ml  Output -100 ml  Net 700 ml     Wt Readings from Last 3 Encounters:  10/28/21 (P) 109.3 kg  10/01/21 112.3 kg  09/20/21 117.9 kg   Physical Exam General: Alert and oriented x 3, NAD Cardiovascular: S1 S2 clear, RRR.  Respiratory: Diminished breath sound at the bases, no wheezing Gastrointestinal: Soft, nontender, nondistended, NBS Ext: no pedal edema bilaterally Neuro: no new deficits Skin: No rashes   Data Reviewed:  I have personally reviewed following labs and imaging studies  Micro Results Recent Results (from the past 240 hour(s))  Resp Panel by RT-PCR (Flu A&B, Covid) Nasopharyngeal Swab     Status: None   Collection Time: 10/25/21  6:32 PM   Specimen: Nasopharyngeal Swab; Nasopharyngeal(NP) swabs in vial transport medium  Result Value Ref Range Status   SARS Coronavirus 2 by RT PCR NEGATIVE NEGATIVE Final    Comment: (NOTE) SARS-CoV-2 target nucleic acids are NOT DETECTED.  The SARS-CoV-2 RNA is generally detectable in upper respiratory specimens during the acute phase of infection. The lowest concentration of SARS-CoV-2  viral copies this assay can detect is 138 copies/mL. A negative result does not preclude SARS-Cov-2 infection and should not be used as the sole basis for treatment or other patient management decisions. A negative result may occur with  improper specimen collection/handling, submission of specimen other than nasopharyngeal swab, presence of viral mutation(s) within the areas targeted by this assay, and inadequate number of viral copies(<138 copies/mL). A negative  result must be combined with clinical observations, patient history, and epidemiological information. The expected result is Negative.  Fact Sheet for Patients:  EntrepreneurPulse.com.au  Fact Sheet for Healthcare Providers:  IncredibleEmployment.be  This test is no t yet approved or cleared by the [place] FDA and  has been authorized for detection and/or diagnosis of SARS-CoV-2 by FDA under an Emergency Use Authorization (EUA). This EUA will remain  in effect (meaning this test can be used) for the duration of the COVID-19 declaration under Section 564(b)(1) of the Act, 21 U.S.C.section 360bbb-3(b)(1), unless the authorization is terminated  or revoked sooner.       Influenza A by PCR NEGATIVE NEGATIVE Final   Influenza B by PCR NEGATIVE NEGATIVE Final    Comment: (NOTE) The Xpert Xpress SARS-CoV-2/FLU/RSV plus assay is intended as an aid in the diagnosis of influenza from Nasopharyngeal swab specimens and should not be used as a sole basis for treatment. Nasal washings and aspirates are unacceptable for Xpert Xpress SARS-CoV-2/FLU/RSV testing.  Fact Sheet for Patients: EntrepreneurPulse.com.au  Fact Sheet for Healthcare Providers: IncredibleEmployment.be  This test is not yet approved or cleared by the [place] FDA and has been authorized for detection and/or diagnosis of SARS-CoV-2 by FDA under an Emergency Use Authorization (EUA). This EUA will remain in effect (meaning this test can be used) for the duration of the COVID-19 declaration under Section 564(b)(1) of the Act, 21 U.S.C. section 360bbb-3(b)(1), unless the authorization is terminated or revoked.  Performed at [facility], [place] [address] [place], [place] [address]     Radiology Reports DG Chest 2 View  Result Date: 10/25/2021 CLINICAL DATA:  Shortness of breath EXAM: CHEST - 2 VIEW COMPARISON:  09/30/2021 FINDINGS:  Left-sided pacing device as before. Cardiomegaly. Suspicion of moderate right pleural effusion. Hazy airspace disease and consolidation at the right base. Aortic atherosclerosis. No pneumothorax. IMPRESSION: 1. Clearing of previously noted left upper lobe pneumonia 2. New hazy airspace disease and consolidation at right base suspicious for right basilar pneumonia. Suspect that there is moderate right pleural effusion 3. Cardiomegaly Electronically Signed   By: Donavan Foil M.D.   On: 10/25/2021 19:28   CT CHEST WO CONTRAST  Result Date: 10/27/2021 CLINICAL DATA:  Pneumonia EXAM: CT CHEST WITHOUT CONTRAST TECHNIQUE: Multidetector CT imaging of the chest was performed following the standard protocol without IV contrast. COMPARISON:  Previous studies including the radiographs done on 10/25/2021 and CT done on 09/16/2021 FINDINGS: Cardiovascular: Coronary artery calcifications are seen. Heart is enlarged in size. Biventricular pacer leads are noted in place. Mediastinum/Nodes: No significant lymphadenopathy seen. Lungs/Pleura: There is significant interval decrease in patchy infiltrate in the left upper lobe. Small nodular infiltrate seen in the right upper lung fields in the previous study could not be distinctly identified. There is interval appearance of large patchy alveolar infiltrates in the right lower lobe and to a lesser extent in the right middle lobe. There is small to moderate right pleural effusion. Part of the effusion appears to be loculated in the interlobar fissure. There is no pneumothorax. Upper Abdomen:  Stomach is distended with fluid. Musculoskeletal: Unremarkable. IMPRESSION: There is a almost complete interval clearing of small infiltrate in the left upper lobe and clearing of small infiltrate in the right upper lobe. There is interval appearance of large patchy alveolar infiltrates in the right lower lobe and to a lesser extent in the right middle lobe. Findings suggest large area of  pneumonia. There is interval appearance of small to moderate right pleural effusion part of which appears to be loculated in the periphery of right lower lung fields and within major fissure. Possibility of empyema is not excluded. Other findings as described in the body of the report. Electronically Signed   By: Elmer Picker M.D.   On: 10/27/2021 19:19   CT LUMBAR SPINE WO CONTRAST  Result Date: 10/28/2021 CLINICAL DATA:  Lumbar radiculopathy. Back pain with persistent symptoms for over 6 weeks of treatment. EXAM: CT LUMBAR SPINE WITHOUT CONTRAST TECHNIQUE: Multidetector CT imaging of the lumbar spine was performed without intravenous contrast administration. Multiplanar CT image reconstructions were also generated. COMPARISON:  09/16/2021 FINDINGS: Segmentation: 5 lumbar type vertebrae Alignment: Physiologic Vertebrae: Interval endplate fractures at L2 inferiorly, L3 superiorly, and L5 inferiorly. These have a nonacute appearance but may be incompletely healed. No overt endplate destruction, lucency in the L2 and L3 bodies is adjacent to the fracture deformities. Paraspinal and other soft tissues: Severe atheromatous plaque of the aorta and branch vessels. Small right pleural effusion. Body wall edema. Disc levels: T12- L1: Unremarkable. L1-L2: Disc narrowing with spondylitic and facet spurring. No noted impingement L2-L3: Disc narrowing and bulging. Facet spurring and ligamentum flavum thickening with severe spinal stenosis. Moderate bilateral foraminal narrowing asymmetric to the right L3-L4: Disc narrowing and bulging with endplate spurring. Laminectomy with patent spinal canal L4-L5: Disc narrowing and bulging with endplate spurring. Laminectomy. L5-S1:Disc collapse with endplate and facet spurring. Moderate foraminal narrowing on both sides. IMPRESSION: 1. Subacute appearing endplate fractures at L2, L3, and L5. These have occurred since November 2022. 2. Lumbar spine degeneration exacerbated by  short pedicles. Advanced spinal stenosis at L2-3. Electronically Signed   By: Jorje Guild M.D.   On: 10/28/2021 11:45   DG Chest Port 1 View  Result Date: 09/30/2021 CLINICAL DATA:  End-stage renal disease.  Pain. EXAM: PORTABLE CHEST 1 VIEW COMPARISON:  September 16, 2021 chest x-ray and November 5th 2022 CT scan of the chest. FINDINGS: The opacity in the left apex remains. Stable cardiomegaly and pacemaker. Stable dialysis catheter. No other interval changes or acute abnormalities. IMPRESSION: 1. Persistent opacity in the left upper lobe favored 2 represent pneumonia on the recent chest x-ray and CT scan. Recommend follow-up CT imaging after completing treatment, in approximately 3 months, to confirm resolution. Electronically Signed   By: Dorise Bullion III M.D.   On: 09/30/2021 14:49   ECHOCARDIOGRAM COMPLETE  Result Date: 10/27/2021    ECHOCARDIOGRAM REPORT   Patient Name:   Alexander [name] [facility] Date of Exam: 10/26/2021 Medical Rec #:  [id number]     Height:       72.0 in Accession #:    [id number]    Weight:       247.5 lb Date of Birth:  05/28/1961     BSA:          2.333 m Patient Age:    27 years      BP:           115/52 mmHg Patient Gender: M  HR:           117 bpm. Exam Location:  Inpatient Procedure: 2D Echo, Cardiac Doppler, Color Doppler and Intracardiac            Opacification Agent Indications:     CHF  History:         Patient has prior history of Echocardiogram examinations, most                  recent 05/24/2020. Pacemaker, Arrythmias:Atrial Fibrillation and                  Tachycardia, Signs/Symptoms:Shortness of Breath; Risk                  Factors:Hypertension and Diabetes. Pneumonia.  Sonographer:     Merrie Roof RDCS Referring Phys:  [id number] [facility] Diagnosing Phys: Adrian Prows MD IMPRESSIONS  1. Left ventricular ejection fraction, by estimation, is 40 to 45%. The left ventricle has mildly decreased function. The left ventricle demonstrates global hypokinesis. There is  severe concentric left ventricular hypertrophy. Left ventricular diastolic  parameters are indeterminate.  2. Right ventricular systolic function is normal. The right ventricular size is normal. There is mildly elevated pulmonary artery systolic pressure. The estimated right ventricular systolic pressure is 72.6 mmHg.  3. Left atrial size was moderately dilated.  4. Right atrial size was mildly dilated.  5. The mitral valve is normal in structure. Moderate mitral valve regurgitation.  6. Vegetation or a thrombus measuring 1.2x1.2 cm seen on the RV lead of the pacemaker. Leaflet involvement cannot be excluded. . The tricuspid valve is degenerative. Tricuspid valve regurgitation is moderate to severe.  7. The aortic valve is normal in structure. Aortic valve regurgitation is not visualized. No aortic stenosis is present.  8. The inferior vena cava is dilated in size with <50% respiratory variability, suggesting right atrial pressure of 15 mmHg. FINDINGS  Left Ventricle: Left ventricular ejection fraction, by estimation, is 40 to 45%. The left ventricle has mildly decreased function. The left ventricle demonstrates global hypokinesis. The left ventricular internal cavity size was normal in size. There is  severe concentric left ventricular hypertrophy. Abnormal (paradoxical) septal motion, consistent with RV pacemaker. Left ventricular diastolic parameters are indeterminate. Right Ventricle: The right ventricular size is normal. No increase in right ventricular wall thickness. Right ventricular systolic function is normal. There is mildly elevated pulmonary artery systolic pressure. The tricuspid regurgitant velocity is 2.42  m/s, and with an assumed right atrial pressure of 15 mmHg, the estimated right ventricular systolic pressure is 20.3 mmHg. Left Atrium: Left atrial size was moderately dilated. Right Atrium: Right atrial size was mildly dilated. Pericardium: There is no evidence of pericardial effusion. Mitral  Valve: The mitral valve is normal in structure. There is moderate thickening of the mitral valve leaflet(s). Moderate mitral valve regurgitation. Tricuspid Valve: Vegetation or a thrombus measuring 1.2x1.2 cm seen on the RV lead of the pacemaker. Leaflet involvement cannot be excluded. The tricuspid valve is degenerative in appearance. Tricuspid valve regurgitation is moderate to severe. Aortic Valve: The aortic valve is normal in structure. Aortic valve regurgitation is not visualized. No aortic stenosis is present. Aortic valve mean gradient measures 3.0 mmHg. Aortic valve peak gradient measures 6.1 mmHg. Aortic valve area, by VTI measures 2.03 cm. Pulmonic Valve: The pulmonic valve was normal in structure. Pulmonic valve regurgitation is not visualized. No evidence of pulmonic stenosis. Aorta: The aortic root is normal in size and structure. Venous: The inferior  vena cava is dilated in size with less than 50% respiratory variability, suggesting right atrial pressure of 15 mmHg. IAS/Shunts: No atrial level shunt detected by color flow Doppler.  LEFT VENTRICLE PLAX 2D LVIDd:         3.80 cm LVIDs:         3.00 cm LV PW:         1.70 cm LV IVS:        1.70 cm LVOT diam:     2.00 cm LV SV:         35 LV SV Index:   15 LVOT Area:     3.14 cm  LV Volumes (MOD) LV vol d, MOD A2C: 145.0 ml LV vol d, MOD A4C: 131.0 ml LV vol s, MOD A2C: 56.7 ml LV vol s, MOD A4C: 109.0 ml LV SV MOD A2C:     88.3 ml LV SV MOD A4C:     131.0 ml LV SV MOD BP:      62.4 ml RIGHT VENTRICLE             IVC RV Basal diam:  4.70 cm     IVC diam: 2.30 cm RV Mid diam:    4.60 cm RV S prime:     14.30 cm/s TAPSE (M-mode): 1.8 cm LEFT ATRIUM              Index        RIGHT ATRIUM           Index LA diam:        3.50 cm  1.50 cm/m   RA Area:     41.40 cm LA Vol (A2C):   100.0 ml 42.87 ml/m  RA Volume:   174.00 ml 74.60 ml/m LA Vol (A4C):   100.0 ml 42.87 ml/m LA Biplane Vol: 107.0 ml 45.87 ml/m  AORTIC VALVE AV Area (Vmax):    2.23 cm AV Area  (Vmean):   1.92 cm AV Area (VTI):     2.03 cm AV Vmax:           123.00 cm/s AV Vmean:          87.200 cm/s AV VTI:            0.174 m AV Peak Grad:      6.1 mmHg AV Mean Grad:      3.0 mmHg LVOT Vmax:         87.50 cm/s LVOT Vmean:        53.300 cm/s LVOT VTI:          0.112 m LVOT/AV VTI ratio: 0.65  AORTA Ao Root diam: 3.60 cm Ao Asc diam:  3.00 cm TRICUSPID VALVE TR Peak grad:   23.4 mmHg TR Vmax:        242.00 cm/s  SHUNTS Systemic VTI:  0.11 m Systemic Diam: 2.00 cm Adrian Prows MD Electronically signed by Adrian Prows MD Signature Date/Time: 10/27/2021/5:33:46 PM    Final    ECHO TEE  Result Date: 10/27/2021    TRANSESOPHOGEAL ECHO REPORT   Patient Name:   Alexander [name] [facility] Date of Exam: 10/27/2021 Medical Rec #:  [id number]     Height:       72.0 in Accession #:    [id number]    Weight:       236.8 lb Date of Birth:  [birth year]     BSA:          2.289 m Patient Age:  60 years      BP:           119/68 mmHg Patient Gender: M             HR:           82 bpm. Exam Location:  Inpatient Procedure: Transesophageal Echo, Cardiac Doppler and Color Doppler Indications:     endocarditis  History:         Patient has prior history of Echocardiogram examinations, most                  recent 10/26/2021. CHF, CAD, Pacemaker, Arrythmias:Atrial                  Fibrillation; Risk Factors:Diabetes.  Sonographer:     Johny Chess RDCS Referring Phys:  [place] Diagnosing Phys: Adrian Prows MD PROCEDURE: After discussion of the risks and benefits of a TEE, an informed consent was obtained from the patient. The transesophogeal probe was passed without difficulty through the esophogus of the patient. Imaged were obtained with the patient in a left lateral decubitus position. Local oropharyngeal anesthetic was provided with Cetacaine. Sedation performed by performing physician. The patient was monitored while under deep sedation. Patients was under conscious sedation during this procedure. Anesthetic administered: 69mcg  of Fentanyl, 2.0mg  of Versed. The patient developed no complications during the procedure. IMPRESSIONS  1. Left ventricular ejection fraction, by estimation, is 40 to 45%. The left ventricle has mildly decreased function. The left ventricle demonstrates global hypokinesis. There is moderate concentric left ventricular hypertrophy.  2. Right ventricular systolic function is normal. The right ventricular size is normal.  3. Left atrial size was moderately dilated. No left atrial/left atrial appendage thrombus was detected. The LAA emptying velocity was 30 cm/s.  4. Right atrial size was mildly dilated. There is a 1x1cm mass attached to the RA pacemaker lead suggestive of thrombus or a vegetation.  5. The mitral valve is normal in structure. Mild to moderate mitral valve regurgitation. No evidence of mitral stenosis.  6. There is a 2cmx1cm mass attached to the RV lead of the pacemaker suggestive of a vegetation or a thrombus. The Anterior and septal TV leaflets are thickened and suggests involvement with the vegetation. The septal leaflet is flail. The presence of flail leaflet suggests vegetation. . The tricuspid valve is degenerative. Tricuspid valve regurgitation is severe.  7. The aortic valve is normal in structure. Aortic valve regurgitation is not visualized. No aortic stenosis is present.  8. There is mild (Grade II) plaque involving the ascending aorta, aortic arch and descending aorta. Comparison(s): A prior study was performed on 05/24/2120. Previously TTE had revealed TV was normal and mild TR. LVEF was normal. FINDINGS  Left Ventricle: Left ventricular ejection fraction, by estimation, is 40 to 45%. The left ventricle has mildly decreased function. The left ventricle demonstrates global hypokinesis. The left ventricular internal cavity size was normal in size. There is  moderate concentric left ventricular hypertrophy. Abnormal (paradoxical) septal motion, consistent with RV pacemaker. Right Ventricle: The  right ventricular size is normal. No increase in right ventricular wall thickness. Right ventricular systolic function is normal. Left Atrium: Left atrial size was moderately dilated. No left atrial/left atrial appendage thrombus was detected. The LAA emptying velocity was 30 cm/s. Right Atrium: Right atrial size was mildly dilated. Prominent Eustachian valve and There is a 1x1cm mass attached to the RA pacemaker lead suggestive of thrombus or a vegetation. Pericardium: Trivial pericardial effusion is  present. Mitral Valve: The mitral valve is normal in structure. Mild to moderate mitral valve regurgitation. No evidence of mitral valve stenosis. Tricuspid Valve: There is a 2cmx1cm mass attached to the RV lead of the pacemaker suggestive of a vegetation or a thrombus. The Anterior and septal TV leaflets are thickened and suggests involvement with the vegetation. The septal leaflet is flail. The presence of flail leaflet suggests vegetation. The tricuspid valve is degenerative in appearance. Tricuspid valve regurgitation is severe. Aortic Valve: The aortic valve is normal in structure. Aortic valve regurgitation is not visualized. No aortic stenosis is present. Pulmonic Valve: The pulmonic valve was normal in structure. Pulmonic valve regurgitation is trivial. Aorta: The aortic root is normal in size and structure. There is mild (Grade II) plaque involving the ascending aorta, aortic arch and descending aorta. IAS/Shunts: No atrial level shunt detected by color flow Doppler. Adrian Prows MD Electronically signed by Adrian Prows MD Signature Date/Time: 10/27/2021/5:28:28 PM    Final     Lab Data:  CBC: Recent Labs  Lab 10/25/21 1828 10/26/21 1055 10/28/21 0140  WBC 13.0* 10.8* 16.0*  NEUTROABS 11.4*  --   --   HGB 8.9* 7.1* 7.4*  HCT 29.2* 23.3* 24.4*  MCV 88.8 87.9 88.4  PLT 97* 91* 836*   Basic Metabolic Panel: Recent Labs  Lab 10/25/21 1828 10/26/21 1055 10/28/21 0140  NA 127* 130* 131*  K 3.8 3.6  4.4  CL 87* 92* 94*  CO2 25 27 26   GLUCOSE 133* 148* 164*  BUN 42* 36* 44*  CREATININE 4.55* 3.65* 4.23*  CALCIUM 7.9* 7.3* 7.8*  PHOS  --  4.3  --    GFR: Estimated Creatinine Clearance: 23.2 mL/min (A) (by C-G formula based on SCr of 4.23 mg/dL (H)). Liver Function Tests: Recent Labs  Lab 10/25/21 1828 10/28/21 0140  AST 92* 240*  ALT 73* 164*  ALKPHOS 82 93  BILITOT 1.7* 1.4*  PROT 6.5 5.6*  ALBUMIN 2.1* 2.0*   No results for input(s): LIPASE, AMYLASE in the last 168 hours. No results for input(s): AMMONIA in the last 168 hours. Coagulation Profile: No results for input(s): INR, PROTIME in the last 168 hours. Cardiac Enzymes: No results for input(s): CKTOTAL, CKMB, CKMBINDEX, TROPONINI in the last 168 hours. BNP (last 3 results) No results for input(s): PROBNP in the last 8760 hours. HbA1C: No results for input(s): HGBA1C in the last 72 hours. CBG: Recent Labs  Lab 10/27/21 1341 10/27/21 1734 10/27/21 2051 10/28/21 0727 10/28/21 1140  GLUCAP 157* 121* 138* 147* 140*   Lipid Profile: No results for input(s): CHOL, HDL, LDLCALC, TRIG, CHOLHDL, LDLDIRECT in the last 72 hours. Thyroid Function Tests: No results for input(s): TSH, T4TOTAL, FREET4, T3FREE, THYROIDAB in the last 72 hours. Anemia Panel: No results for input(s): VITAMINB12, FOLATE, FERRITIN, TIBC, IRON, RETICCTPCT in the last 72 hours. Urine analysis:    Component Value Date/Time   COLORURINE YELLOW 05/23/2020 2057   APPEARANCEUR CLEAR 05/23/2020 2057   LABSPEC 1.008 05/23/2020 2057   PHURINE 5.0 05/23/2020 2057   GLUCOSEU 50 (A) 05/23/2020 2057   HGBUR LARGE (A) 05/23/2020 2057   BILIRUBINUR NEGATIVE 05/23/2020 2057   BILIRUBINUR neg 10/01/2014 1445   KETONESUR NEGATIVE 05/23/2020 2057   PROTEINUR 100 (A) 05/23/2020 2057   UROBILINOGEN 0.2 10/01/2014 1445   UROBILINOGEN 0.2 09/30/2014 2051   NITRITE NEGATIVE 05/23/2020 2057   LEUKOCYTESUR NEGATIVE 05/23/2020 2057     Maleni Seyer  M.D. [facility] 10/28/2021, 3:10 PM  Available  via Epic secure chat 7am-7pm After 7 pm, please refer to night coverage provider listed on amion.

## 2021-10-28 NOTE — Progress Notes (Signed)
Patient not seen. Reviewed the notes from ID and appreciate their input.  Pacemaker extraction and reimplanting new leads will be major issue with ongoing infection. Also high risk for embolization. Hence optimize medical therapy for now.  With regards to TV replacement, patient with his comorbidity, would not be a candidate for surgery.    Adrian Prows, MD, [facility] 10/28/2021, 7:41 AM Office: [phone] Fax: [phone] Pager: [phone]  Mobile: [phone]

## 2021-10-28 NOTE — Progress Notes (Signed)
Hd attempted at [place]. Pt catheter not working well. Venous pressure alarms kept alarming. Venous needles pulled  with clotting noticed at end of needle. Venous needle replaced and tx attempted again with high arterial pressures at a bfr of 150. Bfr turned off and arterial needle readjusted. Tx attempted again with elevated Arterial pressures. Arterial needle pulled  with noticeable clotting at the end of needle. Arterial needle replaced and tx attempted again. Recirculation noticed from avf and tx stopped. Grace,Nephrology PA notified and ordered to discontinue tx until tomorrow.

## 2021-10-28 NOTE — Consult Note (Signed)
[facility]       Reason for Consult: TV endocarditis and Pacemaker lead vegetation    Referring Physician: Dr. Tana Coast  Principal Problem:   Tricuspid valve vegetation Active Problems:   Diabetes mellitus with end stage renal disease ([place])   PVD (peripheral vascular disease) ([place])   CAD (coronary artery disease), native coronary artery   Pacemaker: Medtronic Advisa DR MRI [id number] Dual chamber pacemaker 10/08/2014   ESRD (end stage renal disease) ([place])   Atrial fibrillation with RVR (HCC)   Thrombocytopenia (HCC)   Hyponatremia   Acute on chronic diastolic CHF (congestive heart failure) (HCC)    apixaban  5 mg Oral BID   atorvastatin  20 mg Oral QHS   calcitRIOL  0.5 mcg Oral Q T,Th,Sa-HD   darbepoetin (ARANESP) injection - DIALYSIS  200 mcg Intravenous Q Thu-HD   diltiazem  120 mg Oral Daily   feeding supplement (NEPRO CARB STEADY)  237 mL Oral BID BM   furosemide  80 mg Oral Once per day on Sun Mon Wed Fri   insulin aspart  0-9 Units Subcutaneous TID WC   insulin aspart protamine- aspart  40 Units Subcutaneous BID WC   mouth rinse  15 mL Mouth Rinse BID   metoprolol tartrate  50 mg Oral BID   multivitamin  1 tablet Oral QHS    Recommendations: Vancomycin and cefepime  PM lead extraction when able to do   CT L spine  Assessment: Endocarditis and PM lead vegetation.  On antibiotics and will see if bacteria can be identified.  He will need a prolonged course of antibiotics and consideration of lead extraction.   Also with new back pain and concern for seeding to lumbar region and I think it is worth rescanning his lumbar spine now with above findings and concern for infection.    Antibiotics: Vancomycin and cefepime  HPI: Alexander [name] is a 60 y.o. male with ESRD on IHD with a fistula, recently treated for pneumonia with a CT chest c/w a ground-glass airspace opacity came in now with increased shortness of breath and cough. His work up  included a TTE followed by a TEE with findings of a 2 cm x 1 cm vegetation of the RV lead of the pacemaker, flail TV and thickened c/w a vegetation. Blood cultures sent yesterday and ngtd, done on day 3 of antibiotics.  Had a recent tunneled catheter in place.  No fever, + leukocytosis to 16.  He has had persistent fatigue and feeling poorly since that time.  He feels better now.  Unable to get dialyzed this am.   He also is complaining of debilitating back pain in the lumbar region that has led to significant muscle spasm and has been ongoing about 5 weeks.  He does have a history of lumbar spondylosis and posterior decompression of the lumbar spine.  Recent CT of the spine on 11/5 without concerns for infection.    Review of Systems:  Constitutional: negative for fevers and chills Gastrointestinal: negative for diarrhea Integument/breast: negative for rash All other systems reviewed and are negative    Past Medical History:  Diagnosis Date   Alcohol abuse    Anemia    Arthritis    "patient does not think so."   Atrial fibrillation ([place])    Cardiac syncope 10/07/2014   rapid A fib with 8 sec pauses on converison with syncope- temp pacing wire placed then PPM   Cataract  BILATERAL   Coronary artery disease    Diabetes mellitus    dx---been  awhile   ESRD (end stage renal disease) ([place])    [place]   GERD (gastroesophageal reflux disease)    History of blood transfusion    Hyperlipemia    Hypertension    Osteomyelitis (HCC)    right foot   Peripheral vascular disease (HCC)    Presence of permanent cardiac pacemaker 10/08/2014   Medtronic    Social History   Tobacco Use   Smoking status: Former    Packs/day: 1.50    Years: 30.00    Pack years: 45.00    Types: Cigarettes    Quit date: 10/07/2014    Years since quitting: 7.0   Smokeless tobacco: Never  Vaping Use   Vaping Use: Never used  Substance Use Topics   Alcohol use: Yes    Alcohol/week: 21.0  standard drinks    Types: 21 Glasses of wine per week    Comment: cut way back, none in the last month   Drug use: No    Family History  Problem Relation Age of Onset   Diabetes type II Mother    Hypertension Mother    Diabetes Mother    Liver cancer Father    Diabetes type II Sister    Breast cancer Sister    Diabetes Sister    Hypertension Sister    Diabetes type II Brother    Kidney failure Brother    Diabetes Brother    Hypertension Brother    Diabetes type II Sister     Allergies  Allergen Reactions   Cytoxan [Cyclophosphamide] Other (See Comments)    Pancytopenia   Keflex [Cephalexin] Nausea And Vomiting   Tylenol [Acetaminophen] Other (See Comments)    Makes the patient sweat   Pletal [Cilostazol] Palpitations and Other (See Comments)    "Can hear heart beating loudly".    Physical Exam: Constitutional: in no apparent distress  Vitals:   10/27/21 2213 10/28/21 0437  BP: 114/70 101/64  Pulse: 90 81  Resp:  14  Temp:  98.6 F (37 C)  SpO2:  96%   EYES: anicteric ENMT: poor dentition Cardiovascular: Cor RRR Respiratory: clear; Musculoskeletal: no pedal edema noted Skin: negatives: no rash Neuro: non-focal  Lab Results  Component Value Date   WBC 16.0 (H) 10/28/2021   HGB 7.4 (L) 10/28/2021   HCT 24.4 (L) 10/28/2021   MCV 88.4 10/28/2021   PLT 136 (L) 10/28/2021    Lab Results  Component Value Date   CREATININE 4.23 (H) 10/28/2021   BUN 44 (H) 10/28/2021   NA 131 (L) 10/28/2021   K 4.4 10/28/2021   CL 94 (L) 10/28/2021   CO2 26 10/28/2021    Lab Results  Component Value Date   ALT 164 (H) 10/28/2021   AST 240 (H) 10/28/2021   ALKPHOS 93 10/28/2021     Microbiology: Recent Results (from the past 240 hour(s))  Resp Panel by RT-PCR (Flu A&B, Covid) Nasopharyngeal Swab     Status: None   Collection Time: 10/25/21  6:32 PM   Specimen: Nasopharyngeal Swab; Nasopharyngeal(NP) swabs in vial transport medium  Result Value Ref Range Status    SARS Coronavirus 2 by RT PCR NEGATIVE NEGATIVE Final    Comment: (NOTE) SARS-CoV-2 target nucleic acids are NOT DETECTED.  The SARS-CoV-2 RNA is generally detectable in upper respiratory specimens during the acute phase of infection. The lowest concentration of SARS-CoV-2 viral  copies this assay can detect is 138 copies/mL. A negative result does not preclude SARS-Cov-2 infection and should not be used as the sole basis for treatment or other patient management decisions. A negative result may occur with  improper specimen collection/handling, submission of specimen other than nasopharyngeal swab, presence of viral mutation(s) within the areas targeted by this assay, and inadequate number of viral copies(<138 copies/mL). A negative result must be combined with clinical observations, patient history, and epidemiological information. The expected result is Negative.  Fact Sheet for Patients:  EntrepreneurPulse.com.au  Fact Sheet for Healthcare Providers:  IncredibleEmployment.be  This test is no t yet approved or cleared by the [place] FDA and  has been authorized for detection and/or diagnosis of SARS-CoV-2 by FDA under an Emergency Use Authorization (EUA). This EUA will remain  in effect (meaning this test can be used) for the duration of the COVID-19 declaration under Section 564(b)(1) of the Act, 21 U.S.C.section 360bbb-3(b)(1), unless the authorization is terminated  or revoked sooner.       Influenza A by PCR NEGATIVE NEGATIVE Final   Influenza B by PCR NEGATIVE NEGATIVE Final    Comment: (NOTE) The Xpert Xpress SARS-CoV-2/FLU/RSV plus assay is intended as an aid in the diagnosis of influenza from Nasopharyngeal swab specimens and should not be used as a sole basis for treatment. Nasal washings and aspirates are unacceptable for Xpert Xpress SARS-CoV-2/FLU/RSV testing.  Fact Sheet for  Patients: EntrepreneurPulse.com.au  Fact Sheet for Healthcare Providers: IncredibleEmployment.be  This test is not yet approved or cleared by the [place] FDA and has been authorized for detection and/or diagnosis of SARS-CoV-2 by FDA under an Emergency Use Authorization (EUA). This EUA will remain in effect (meaning this test can be used) for the duration of the COVID-19 declaration under Section 564(b)(1) of the Act, 21 U.S.C. section 360bbb-3(b)(1), unless the authorization is terminated or revoked.  Performed at [facility], [place] [address] [place], [place] [address]     Samrat Hayward W Doretta Remmert, [place] [facility] [email or web address] 10/28/2021, 9:46 AM

## 2021-10-29 ENCOUNTER — Inpatient Hospital Stay (HOSPITAL_COMMUNITY): Payer: Medicare Other

## 2021-10-29 ENCOUNTER — Encounter (HOSPITAL_COMMUNITY): Payer: Self-pay | Admitting: Cardiology

## 2021-10-29 LAB — COMPREHENSIVE METABOLIC PANEL
ALT: 174 U/L — ABNORMAL HIGH (ref 0–44)
AST: 180 U/L — ABNORMAL HIGH (ref 15–41)
Albumin: 1.8 g/dL — ABNORMAL LOW (ref 3.5–5.0)
Alkaline Phosphatase: 80 U/L (ref 38–126)
Anion gap: 11 (ref 5–15)
BUN: 54 mg/dL — ABNORMAL HIGH (ref 8–23)
CO2: 25 mmol/L (ref 22–32)
Calcium: 7.8 mg/dL — ABNORMAL LOW (ref 8.9–10.3)
Chloride: 94 mmol/L — ABNORMAL LOW (ref 98–111)
Creatinine, Ser: 5.34 mg/dL — ABNORMAL HIGH (ref 0.61–1.24)
GFR, Estimated: 11 mL/min — ABNORMAL LOW (ref >60)
Glucose, Bld: 134 mg/dL — ABNORMAL HIGH (ref 70–99)
Potassium: 4.2 mmol/L (ref 3.5–5.1)
Sodium: 130 mmol/L — ABNORMAL LOW (ref 135–145)
Total Bilirubin: 1.8 mg/dL — ABNORMAL HIGH (ref 0.3–1.2)
Total Protein: 5.5 g/dL — ABNORMAL LOW (ref 6.5–8.1)

## 2021-10-29 LAB — CBC
HCT: 27.4 % — ABNORMAL LOW (ref 39.0–52.0)
Hemoglobin: 8 g/dL — ABNORMAL LOW (ref 13.0–17.0)
MCH: 26.8 pg (ref 26.0–34.0)
MCHC: 29.2 g/dL — ABNORMAL LOW (ref 30.0–36.0)
MCV: 91.6 fL (ref 80.0–100.0)
Platelets: 153 10*3/uL (ref 150–400)
RBC: 2.99 MIL/uL — ABNORMAL LOW (ref 4.22–5.81)
RDW: 20.2 % — ABNORMAL HIGH (ref 11.5–15.5)
WBC: 22 10*3/uL — ABNORMAL HIGH (ref 4.0–10.5)
nRBC: 0.2 % (ref 0.0–0.2)

## 2021-10-29 LAB — GLUCOSE, CAPILLARY
Glucose-Capillary: 148 mg/dL — ABNORMAL HIGH (ref 70–99)
Glucose-Capillary: 135 mg/dL — ABNORMAL HIGH (ref 70–99)
Glucose-Capillary: 142 mg/dL — ABNORMAL HIGH (ref 70–99)
Glucose-Capillary: 210 mg/dL — ABNORMAL HIGH (ref 70–99)

## 2021-10-29 LAB — AMMONIA: Ammonia: 21 umol/L (ref 9–35)

## 2021-10-29 LAB — HEPATITIS PANEL, ACUTE
HCV Ab: NONREACTIVE
Hep A IgM: NONREACTIVE
Hep B C IgM: NONREACTIVE
Hepatitis B Surface Ag: NONREACTIVE

## 2021-10-29 LAB — HEMOGLOBIN A1C
Hgb A1c MFr Bld: 6.8 % — ABNORMAL HIGH (ref 4.8–5.6)
Mean Plasma Glucose: 148.46 mg/dL

## 2021-10-29 MED ORDER — VANCOMYCIN HCL IN DEXTROSE 1-5 GM/200ML-% IV SOLN
1000.0000 mg | Freq: Once | INTRAVENOUS | Status: AC
  Administered 2021-10-30: 1000 mg via INTRAVENOUS
  Filled 2021-10-29: qty 200

## 2021-10-29 MED ORDER — METHOCARBAMOL 500 MG PO TABS
500.0000 mg | ORAL_TABLET | Freq: Three times a day (TID) | ORAL | Status: DC | PRN
  Administered 2021-10-30 – 2021-11-04 (×5): 500 mg via ORAL
  Filled 2021-10-29 (×6): qty 1

## 2021-10-29 MED ORDER — CALCITRIOL 0.5 MCG PO CAPS
0.5000 ug | ORAL_CAPSULE | Freq: Once | ORAL | Status: AC
  Administered 2021-10-30: 0.5 ug via ORAL
  Filled 2021-10-29: qty 1

## 2021-10-29 MED ORDER — LIDOCAINE 5 % EX PTCH
1.0000 | MEDICATED_PATCH | CUTANEOUS | Status: DC
  Administered 2021-10-29 – 2021-11-04 (×2): 1 via TRANSDERMAL
  Filled 2021-10-29 (×6): qty 1

## 2021-10-29 MED ORDER — INSULIN ASPART PROT & ASPART (70-30 MIX) 100 UNIT/ML ~~LOC~~ SUSP
30.0000 [IU] | Freq: Two times a day (BID) | SUBCUTANEOUS | Status: DC
Start: 2021-10-29 — End: 2021-10-29
  Filled 2021-10-29: qty 10

## 2021-10-29 MED ORDER — INSULIN ASPART 100 UNIT/ML IJ SOLN
0.0000 [IU] | Freq: Three times a day (TID) | INTRAMUSCULAR | Status: DC
  Administered 2021-10-30 (×2): 1 [IU] via SUBCUTANEOUS
  Administered 2021-10-30 – 2021-10-31 (×2): 3 [IU] via SUBCUTANEOUS
  Administered 2021-10-31: 23:00:00 2 [IU] via SUBCUTANEOUS
  Administered 2021-10-31 – 2021-11-01 (×2): 3 [IU] via SUBCUTANEOUS
  Administered 2021-11-01: 10:00:00 4 [IU] via SUBCUTANEOUS
  Administered 2021-11-02 (×2): 2 [IU] via SUBCUTANEOUS
  Administered 2021-11-02: 09:00:00 3 [IU] via SUBCUTANEOUS

## 2021-10-29 MED ORDER — SODIUM CHLORIDE 0.9 % IV SOLN
2.0000 g | Freq: Once | INTRAVENOUS | Status: AC
  Administered 2021-10-30: 2 g via INTRAVENOUS
  Filled 2021-10-29: qty 2

## 2021-10-29 NOTE — Progress Notes (Addendum)
Hypoglycemic Event 12/17 21:55  CBG:         Treatment 21:55  54   8 oz juice 22:33  48   4 oz juice, graham crackers w/ PB, 1/2 frozen dinner 22:54  49   1 50 mL amp of D50 23:18  161   Symptoms: sweaty, shaky, irritable  Possible Reasons for Event: Inadequate meal intake-pt was given 40u of novolog 70/30 on 12/17 at 18:00 but did not eat his dinner   [place]

## 2021-10-29 NOTE — Progress Notes (Signed)
Inpatient Diabetes Program Recommendations  AACE/ADA: New Consensus Statement on Inpatient Glycemic Control (2015)  Target Ranges:  Prepandial:   less than 140 mg/dL      Peak postprandial:   less than 180 mg/dL (1-2 hours)      Critically ill patients:  140 - 180 mg/dL   Lab Results  Component Value Date   GLUCAP 135 (H) 10/29/2021   HGBA1C 6.8 (H) 10/29/2021    Review of Glycemic Control  Latest Reference Range & Units 10/29/21 07:40 10/29/21 11:11  Glucose-Capillary 70 - 99 mg/dL 148 (H) 135 (H)   Diabetes history: DM 2 Outpatient Diabetes medications: Novolin 70/30 60 units bid- NOT TAKING Current orders for Inpatient glycemic control:  Novolog (0-6 units) tid with meals Novolin 70/30 30 units bid  Inpatient Diabetes Program Recommendations:    Consider holding 70/30 for now.  Increase frequency of Novolog to q 4 hours while 70/30 on hold.    Thanks,  Adah Perl, RN, BC-ADM Inpatient Diabetes Coordinator Pager [phone]  (8a-5p)

## 2021-10-29 NOTE — Progress Notes (Signed)
[facility]                                                                              Patient Demographics  Alexander [name], is a 60 y.o. male, DOB - 1961/07/17, JTT:[id number]  Admit date - 10/25/2021   Admitting Physician Alexander Desanctis, DO  Outpatient Primary MD for the patient is Tisovec, [name], MD  Outpatient specialists:   LOS - 4  days   Medical records reviewed and are as summarized below:    Chief Complaint  Patient presents with   Shortness of Breath   Back Pain       Brief summary   Patient is a 60 year old male with ESRD on HD, TTS, paroxysmal A. fib on Eliquis, pacemaker, CAD status post stent, diastolic CHF, PVD with right BKA, prior history of GI bleed, diabetes mellitus type 2 presented with increasing shortness of breath and cough.  Per patient he was diagnosed with pneumonia in early November and completed course of antibiotics.  However he has not felt back to baseline since.  Has had persistent shortness of breath and coughing, fatigue.  Also noted some anterior and right lateral sided rib pain worse with coughing and worsening left lower extremity edema.  He did complete his HD last session prior to admission. In ED, patient was hypoxic down to 80%, was put on 2 L BNP 1000 181.  Chest x-ray showed right basilar pneumonia, he was placed on IV Rocephin and doxycycline.   Assessment & Plan    Principal Problem: Acute respiratory failure with hypoxia, multifactorial, acute on chronic diastolic CHF, community-acquired pneumonia, paroxysmal A. fib with RVR (Alexander [name]) -Patient was hypoxic in ED, sats 80%, placed on 2 L O2 -Chest x-ray showed clearing of prior LUL pneumonia, new hazy airspace disease and consolidation at the right base suspicion for right basilar pneumonia, moderate right pleural effusion, cardiomegaly -Currently improving, O2 sats 93% on room air  Active Problems: Acute on chronic diastolic CHF (congestive heart failure)  (Alexander [name]) -Suspect worsened due to pneumonia and A. fib with RVR, has ESRD on HD -Nephrology following, was unable to dialyze on 12/17, due to access issues  Tricuspid valve Infective endocarditis -2D echo showed large 1.2x 1.3 x 1 cm tricuspid valve vegetation close to pacemaker lead -TEE on 12/16 showed 1x 1 cm vegetation attached to right atrium pacemaker lead.  Large vegetation attached to TEE measuring 2x 1 cm, also appears to be attached to RV pacemaker lead, severe TR.  Per Dr. [name], pacemaker extraction and reimplant of new leads will be a major issue with ongoing infection and high risk of embolization hence recommended medical therapy for now -CT L-spine showed subacute appearing endplate fractures of E0-9 and L5 since November 2022, does not appear to have discitis or abscess on CT. -ID following, continue IV vancomycin, cefepime  Transaminitis -Unclear etiology, discontinued statins, LFTs slightly trending down today.  Ammonia level normal. -Follow hepatitis panel -RUQ US showed nonspecific dilation of the CBD measuring 1.1 cm, if concern for biliary duct obstruction consider MRCP, moderate amount of sludge in gallbladder, normal sonographic appearance of liver.   -Currently no  abdominal pain, fevers, jaundice, LFTs improving, normal alk phos, will recheck LFTs in a.m., consult GI if worsening again   Paroxysmal atrial fibrillation with RVR -Continue Eliquis.  Heart rate controlled.  Right lung pneumonia -Chest x-ray showed consolidation at the right base -Currently on vancomycin and cefepime  ESRD on hemodialysis, TTS -Underwent HD on 12/15, unsuccessful HD today due to access issues.    Diabetes mellitus type II, IDDM with end stage renal disease (Alexander [name]) -Hemoglobin A1c 7.8 on 11/5 -This a.m., having hypoglycemia episodes, decreased NovoLog 70/30 to 30 units twice daily, continue renal sensitive sliding scale insulin    PVD (peripheral vascular disease) (Alexander [name]), -Has history of  right BKA   CAD (coronary artery disease), native coronary artery -Currently no acute chest pain, continue metoprolol, Lipitor, eliquis    Pacemaker: [employer] Advisa DR MRI [id number] Dual chamber pacemaker 10/08/2014  Anemia with thrombocytopenia (HCC) -Platelets normalized 153K today. -H&H stable and improving, 8.0  Acute on chronic hyponatremia -Possibly due to fluid overload, continue current management, -Sodium stable for now  Obesity Estimated body mass index is 32.68 kg/m (pended) as calculated from the following:   Height as of this encounter: 6' (1.829 m).   Weight as of this encounter: (P) 109.3 kg.  Code Status: Full CODE STATUS DVT Prophylaxis:   apixaban (ELIQUIS) tablet 5 mg   Level of Care: Level of care: Telemetry Cardiac Family Communication: Discussed all imaging results, lab results, explained to the patient's wife in detail on the phone  Disposition Plan:     Status is: Inpatient  Remains inpatient appropriate because: Acute respiratory failure with hypoxia, Infective endocarditis  Time Spent in minutes 25 minutes  Procedures:  2D echo TEE  Consultants:   Nephrology, Dr. Jonnie Finner Cardiology, Dr. [name] Infectious disease  Antimicrobials:   Anti-infectives (From admission, onward)    Start     Dose/Rate Route Frequency Ordered Stop   10/29/21 1200  vancomycin (VANCOCIN) IVPB 1000 mg/200 mL premix        1,000 mg 200 mL/hr over 60 Minutes Intravenous  Once 10/28/21 1422     10/29/21 1200  ceFEPIme (MAXIPIME) 2 g in sodium chloride 0.9 % 100 mL IVPB        2 g 200 mL/hr over 30 Minutes Intravenous  Once 10/28/21 1422     10/28/21 1414  vancomycin variable dose per unstable renal function (pharmacist dosing)         Does not apply See admin instructions 10/28/21 1422     10/28/21 1200  ceFEPIme (MAXIPIME) 2 g in sodium chloride 0.9 % 100 mL IVPB        2 g 200 mL/hr over 30 Minutes Intravenous Every T-Th-Sa (Hemodialysis) 10/27/21 1717      10/28/21 1200  vancomycin (VANCOCIN) IVPB 1000 mg/200 mL premix        1,000 mg 200 mL/hr over 60 Minutes Intravenous Every T-Th-Sa (Hemodialysis) 10/27/21 1717     10/27/21 2200  cefTRIAXone (ROCEPHIN) 1 g in sodium chloride 0.9 % 100 mL IVPB  Status:  Discontinued        1 g 200 mL/hr over 30 Minutes Intravenous Every 24 hours 10/26/21 0108 10/27/21 1653   10/27/21 1815  ceFEPIme (MAXIPIME) 2 g in sodium chloride 0.9 % 100 mL IVPB        2 g 200 mL/hr over 30 Minutes Intravenous  Once 10/27/21 1717 10/27/21 1920   10/27/21 1815  vancomycin (VANCOCIN) 2,500 mg in sodium chloride 0.9 % 500 mL IVPB  2,500 mg 250 mL/hr over 120 Minutes Intravenous  Once 10/27/21 1717 10/27/21 2202   10/25/21 2200  cefTRIAXone (ROCEPHIN) 2 g in sodium chloride 0.9 % 100 mL IVPB        2 g 200 mL/hr over 30 Minutes Intravenous  Once 10/25/21 2148 10/26/21 0029   10/25/21 2200  doxycycline (VIBRA-TABS) tablet 100 mg  Status:  Discontinued        100 mg Oral Every 12 hours 10/25/21 2148 10/27/21 1653          Medications  Scheduled Meds:  apixaban  5 mg Oral BID   calcitRIOL  0.5 mcg Oral Q T,Th,Sa-HD   calcitRIOL  0.5 mcg Oral Once   darbepoetin (ARANESP) injection - DIALYSIS  200 mcg Intravenous Q Thu-HD   diltiazem  120 mg Oral Daily   feeding supplement (NEPRO CARB STEADY)  237 mL Oral BID BM   furosemide  80 mg Oral Once per day on Sun Mon Wed Fri   insulin aspart  0-6 Units Subcutaneous TID WC   insulin aspart protamine- aspart  30 Units Subcutaneous BID WC   lidocaine  1 patch Transdermal Q24H   mouth rinse  15 mL Mouth Rinse BID   metoprolol tartrate  50 mg Oral BID   multivitamin  1 tablet Oral QHS   vancomycin variable dose per unstable renal function (pharmacist dosing)   Does not apply See admin instructions   Continuous Infusions:  sodium chloride     sodium chloride     sodium chloride 10 mL/hr at 10/27/21 1845   ceFEPime (MAXIPIME) IV     ceFEPime (MAXIPIME) IV      vancomycin     vancomycin     PRN Meds:.      Subjective:   Teng Decou was seen and examined today.  Overnight had hypoglycemia episodes.  No fevers, nausea vomiting or abdominal pain.  Feels shaky today.   Objective:   Vitals:   10/29/21 0445 10/29/21 0924 10/29/21 0928 10/29/21 1113  BP:  124/64 126/64 124/70  Pulse:  (!) 114 (!) 114 100  Resp:   18 17  Temp: 99.1 F (37.3 C)  99.1 F (37.3 C) 99.1 F (37.3 C)  TempSrc: Oral   Oral  SpO2: 93%     Weight:      Height:        Intake/Output Summary (Last 24 hours) at 10/29/2021 1444 Last data filed at 10/28/2021 2248 Gross per 24 hour  Intake 360 ml  Output --  Net 360 ml     Wt Readings from Last 3 Encounters:  10/28/21 (P) 109.3 kg  10/01/21 112.3 kg  09/20/21 117.9 kg   Physical Exam General: Alert and oriented x 3, NAD Cardiovascular: Irregular Respiratory: Diminished breath sound at the bases Gastrointestinal: Soft, nontender, nondistended, NBS Ext: right BKA, left TMA   Data Reviewed:  I have personally reviewed following labs and imaging studies  Micro Results Recent Results (from the past 240 hour(s))  Resp Panel by RT-PCR (Flu A&B, Covid) Nasopharyngeal Swab     Status: None   Collection Time: 10/25/21  6:32 PM   Specimen: Nasopharyngeal Swab; Nasopharyngeal(NP) swabs in vial transport medium  Result Value Ref Range Status   SARS Coronavirus 2 by RT PCR NEGATIVE NEGATIVE Final    Comment: (NOTE) SARS-CoV-2 target nucleic acids are NOT DETECTED.  The SARS-CoV-2 RNA is generally detectable in upper respiratory specimens during the acute phase of infection. The lowest concentration of SARS-CoV-2  viral copies this assay can detect is 138 copies/mL. A negative result does not preclude SARS-Cov-2 infection and should not be used as the sole basis for treatment or other patient management decisions. A negative result may occur with  improper specimen collection/handling, submission of specimen  other than nasopharyngeal swab, presence of viral mutation(s) within the areas targeted by this assay, and inadequate number of viral copies(<138 copies/mL). A negative result must be combined with clinical observations, patient history, and epidemiological information. The expected result is Negative.  Fact Sheet for Patients:  EntrepreneurPulse.com.au  Fact Sheet for Healthcare Providers:  IncredibleEmployment.be  This test is no t yet approved or cleared by the [place] FDA and  has been authorized for detection and/or diagnosis of SARS-CoV-2 by FDA under an Emergency Use Authorization (EUA). This EUA will remain  in effect (meaning this test can be used) for the duration of the COVID-19 declaration under Section 564(b)(1) of the Act, 21 U.S.C.section 360bbb-3(b)(1), unless the authorization is terminated  or revoked sooner.       Influenza A by PCR NEGATIVE NEGATIVE Final   Influenza B by PCR NEGATIVE NEGATIVE Final    Comment: (NOTE) The Xpert Xpress SARS-CoV-2/FLU/RSV plus assay is intended as an aid in the diagnosis of influenza from Nasopharyngeal swab specimens and should not be used as a sole basis for treatment. Nasal washings and aspirates are unacceptable for Xpert Xpress SARS-CoV-2/FLU/RSV testing.  Fact Sheet for Patients: EntrepreneurPulse.com.au  Fact Sheet for Healthcare Providers: IncredibleEmployment.be  This test is not yet approved or cleared by the [place] FDA and has been authorized for detection and/or diagnosis of SARS-CoV-2 by FDA under an Emergency Use Authorization (EUA). This EUA will remain in effect (meaning this test can be used) for the duration of the COVID-19 declaration under Section 564(b)(1) of the Act, 21 U.S.C. section 360bbb-3(b)(1), unless the authorization is terminated or revoked.  Performed at [facility], [place] [address], [place],  [place] [address]   Culture, blood (Routine X 2) w Reflex to ID Panel     Status: None (Preliminary result)   Collection Time: 10/27/21 10:19 AM   Specimen: Left Antecubital; Blood  Result Value Ref Range Status   Specimen Description LEFT ANTECUBITAL  Final   Special Requests   Final    BOTTLES DRAWN AEROBIC AND ANAEROBIC Blood Culture adequate volume   Culture   Final    NO GROWTH 1 DAY Performed at [facility], [place] [address] [place], [place] [address]    Report Status PENDING  Incomplete  Culture, blood (Routine X 2) w Reflex to ID Panel     Status: None (Preliminary result)   Collection Time: 10/27/21 10:36 AM   Specimen: BLOOD LEFT ARM  Result Value Ref Range Status   Specimen Description BLOOD LEFT ARM  Final   Special Requests   Final    BOTTLES DRAWN AEROBIC AND ANAEROBIC Blood Culture adequate volume   Culture   Final    NO GROWTH 1 DAY Performed at [facility], [place] [address] [place], [place] [address]    Report Status PENDING  Incomplete    Radiology Reports DG Chest 2 View  Result Date: 10/25/2021 CLINICAL DATA:  Shortness of breath EXAM: CHEST - 2 VIEW COMPARISON:  09/30/2021 FINDINGS: Left-sided pacing device as before. Cardiomegaly. Suspicion of moderate right pleural effusion. Hazy airspace disease and consolidation at the right base. Aortic atherosclerosis. No pneumothorax. IMPRESSION: 1. Clearing of previously noted left  upper lobe pneumonia 2. New hazy airspace disease and consolidation at right base suspicious for right basilar pneumonia. Suspect that there is moderate right pleural effusion 3. Cardiomegaly Electronically Signed   By: Donavan Foil M.D.   On: 10/25/2021 19:28   CT CHEST WO CONTRAST  Result Date: 10/27/2021 CLINICAL DATA:  Pneumonia EXAM: CT CHEST WITHOUT CONTRAST TECHNIQUE: Multidetector CT imaging of the chest was performed following the standard protocol without IV contrast. COMPARISON:  Previous studies including the radiographs  done on 10/25/2021 and CT done on 09/16/2021 FINDINGS: Cardiovascular: Coronary artery calcifications are seen. Heart is enlarged in size. Biventricular pacer leads are noted in place. Mediastinum/Nodes: No significant lymphadenopathy seen. Lungs/Pleura: There is significant interval decrease in patchy infiltrate in the left upper lobe. Small nodular infiltrate seen in the right upper lung fields in the previous study could not be distinctly identified. There is interval appearance of large patchy alveolar infiltrates in the right lower lobe and to a lesser extent in the right middle lobe. There is small to moderate right pleural effusion. Part of the effusion appears to be loculated in the interlobar fissure. There is no pneumothorax. Upper Abdomen: Stomach is distended with fluid. Musculoskeletal: Unremarkable. IMPRESSION: There is a almost complete interval clearing of small infiltrate in the left upper lobe and clearing of small infiltrate in the right upper lobe. There is interval appearance of large patchy alveolar infiltrates in the right lower lobe and to a lesser extent in the right middle lobe. Findings suggest large area of pneumonia. There is interval appearance of small to moderate right pleural effusion part of which appears to be loculated in the periphery of right lower lung fields and within major fissure. Possibility of empyema is not excluded. Other findings as described in the body of the report. Electronically Signed   By: Elmer Picker M.D.   On: 10/27/2021 19:19   CT LUMBAR SPINE WO CONTRAST  Result Date: 10/28/2021 CLINICAL DATA:  Lumbar radiculopathy. Back pain with persistent symptoms for over 6 weeks of treatment. EXAM: CT LUMBAR SPINE WITHOUT CONTRAST TECHNIQUE: Multidetector CT imaging of the lumbar spine was performed without intravenous contrast administration. Multiplanar CT image reconstructions were also generated. COMPARISON:  09/16/2021 FINDINGS: Segmentation: 5 lumbar  type vertebrae Alignment: Physiologic Vertebrae: Interval endplate fractures at L2 inferiorly, L3 superiorly, and L5 inferiorly. These have a nonacute appearance but may be incompletely healed. No overt endplate destruction, lucency in the L2 and L3 bodies is adjacent to the fracture deformities. Paraspinal and other soft tissues: Severe atheromatous plaque of the aorta and branch vessels. Small right pleural effusion. Body wall edema. Disc levels: T12- L1: Unremarkable. L1-L2: Disc narrowing with spondylitic and facet spurring. No noted impingement L2-L3: Disc narrowing and bulging. Facet spurring and ligamentum flavum thickening with severe spinal stenosis. Moderate bilateral foraminal narrowing asymmetric to the right L3-L4: Disc narrowing and bulging with endplate spurring. Laminectomy with patent spinal canal L4-L5: Disc narrowing and bulging with endplate spurring. Laminectomy. L5-S1:Disc collapse with endplate and facet spurring. Moderate foraminal narrowing on both sides. IMPRESSION: 1. Subacute appearing endplate fractures at L2, L3, and L5. These have occurred since November 2022. 2. Lumbar spine degeneration exacerbated by short pedicles. Advanced spinal stenosis at L2-3. Electronically Signed   By: Jorje Guild M.D.   On: 10/28/2021 11:45   DG Chest Port 1 View  Result Date: 09/30/2021 CLINICAL DATA:  End-stage renal disease.  Pain. EXAM: PORTABLE CHEST 1 VIEW COMPARISON:  September 16, 2021 chest x-ray and November  5th 2022 CT scan of the chest. FINDINGS: The opacity in the left apex remains. Stable cardiomegaly and pacemaker. Stable dialysis catheter. No other interval changes or acute abnormalities. IMPRESSION: 1. Persistent opacity in the left upper lobe favored 2 represent pneumonia on the recent chest x-ray and CT scan. Recommend follow-up CT imaging after completing treatment, in approximately 3 months, to confirm resolution. Electronically Signed   By: Dorise Bullion III M.D.   On:  09/30/2021 14:49   ECHOCARDIOGRAM COMPLETE  Result Date: 10/27/2021    ECHOCARDIOGRAM REPORT   Patient Name:   CODEN FRANCHI [facility] Date of Exam: 10/26/2021 Medical Rec #:  [id number]     Height:       72.0 in Accession #:    [id number]    Weight:       247.5 lb Date of Birth:  04-17-1961     BSA:          2.333 m Patient Age:    70 years      BP:           115/52 mmHg Patient Gender: M             HR:           117 bpm. Exam Location:  Inpatient Procedure: 2D Echo, Cardiac Doppler, Color Doppler and Intracardiac            Opacification Agent Indications:     CHF  History:         Patient has prior history of Echocardiogram examinations, most                  recent 05/24/2020. Pacemaker, Arrythmias:Atrial Fibrillation and                  Tachycardia, Signs/Symptoms:Shortness of Breath; Risk                  Factors:Hypertension and Diabetes. Pneumonia.  Sonographer:     Merrie Roof RDCS Referring Phys:  [id number] [facility] Diagnosing Phys: Adrian Prows MD IMPRESSIONS  1. Left ventricular ejection fraction, by estimation, is 40 to 45%. The left ventricle has mildly decreased function. The left ventricle demonstrates global hypokinesis. There is severe concentric left ventricular hypertrophy. Left ventricular diastolic  parameters are indeterminate.  2. Right ventricular systolic function is normal. The right ventricular size is normal. There is mildly elevated pulmonary artery systolic pressure. The estimated right ventricular systolic pressure is 74.0 mmHg.  3. Left atrial size was moderately dilated.  4. Right atrial size was mildly dilated.  5. The mitral valve is normal in structure. Moderate mitral valve regurgitation.  6. Vegetation or a thrombus measuring 1.2x1.2 cm seen on the RV lead of the pacemaker. Leaflet involvement cannot be excluded. . The tricuspid valve is degenerative. Tricuspid valve regurgitation is moderate to severe.  7. The aortic valve is normal in structure. Aortic valve regurgitation is not  visualized. No aortic stenosis is present.  8. The inferior vena cava is dilated in size with <50% respiratory variability, suggesting right atrial pressure of 15 mmHg. FINDINGS  Left Ventricle: Left ventricular ejection fraction, by estimation, is 40 to 45%. The left ventricle has mildly decreased function. The left ventricle demonstrates global hypokinesis. The left ventricular internal cavity size was normal in size. There is  severe concentric left ventricular hypertrophy. Abnormal (paradoxical) septal motion, consistent with RV pacemaker. Left ventricular diastolic parameters are indeterminate. Right Ventricle: The right ventricular size is normal. No increase in  right ventricular wall thickness. Right ventricular systolic function is normal. There is mildly elevated pulmonary artery systolic pressure. The tricuspid regurgitant velocity is 2.42  m/s, and with an assumed right atrial pressure of 15 mmHg, the estimated right ventricular systolic pressure is 15.4 mmHg. Left Atrium: Left atrial size was moderately dilated. Right Atrium: Right atrial size was mildly dilated. Pericardium: There is no evidence of pericardial effusion. Mitral Valve: The mitral valve is normal in structure. There is moderate thickening of the mitral valve leaflet(s). Moderate mitral valve regurgitation. Tricuspid Valve: Vegetation or a thrombus measuring 1.2x1.2 cm seen on the RV lead of the pacemaker. Leaflet involvement cannot be excluded. The tricuspid valve is degenerative in appearance. Tricuspid valve regurgitation is moderate to severe. Aortic Valve: The aortic valve is normal in structure. Aortic valve regurgitation is not visualized. No aortic stenosis is present. Aortic valve mean gradient measures 3.0 mmHg. Aortic valve peak gradient measures 6.1 mmHg. Aortic valve area, by VTI measures 2.03 cm. Pulmonic Valve: The pulmonic valve was normal in structure. Pulmonic valve regurgitation is not visualized. No evidence of pulmonic  stenosis. Aorta: The aortic root is normal in size and structure. Venous: The inferior vena cava is dilated in size with less than 50% respiratory variability, suggesting right atrial pressure of 15 mmHg. IAS/Shunts: No atrial level shunt detected by color flow Doppler.  LEFT VENTRICLE PLAX 2D LVIDd:         3.80 cm LVIDs:         3.00 cm LV PW:         1.70 cm LV IVS:        1.70 cm LVOT diam:     2.00 cm LV SV:         35 LV SV Index:   15 LVOT Area:     3.14 cm  LV Volumes (MOD) LV vol d, MOD A2C: 145.0 ml LV vol d, MOD A4C: 131.0 ml LV vol s, MOD A2C: 56.7 ml LV vol s, MOD A4C: 109.0 ml LV SV MOD A2C:     88.3 ml LV SV MOD A4C:     131.0 ml LV SV MOD BP:      62.4 ml RIGHT VENTRICLE             IVC RV Basal diam:  4.70 cm     IVC diam: 2.30 cm RV Mid diam:    4.60 cm RV S prime:     14.30 cm/s TAPSE (M-mode): 1.8 cm LEFT ATRIUM              Index        RIGHT ATRIUM           Index LA diam:        3.50 cm  1.50 cm/m   RA Area:     41.40 cm LA Vol (A2C):   100.0 ml 42.87 ml/m  RA Volume:   174.00 ml 74.60 ml/m LA Vol (A4C):   100.0 ml 42.87 ml/m LA Biplane Vol: 107.0 ml 45.87 ml/m  AORTIC VALVE AV Area (Vmax):    2.23 cm AV Area (Vmean):   1.92 cm AV Area (VTI):     2.03 cm AV Vmax:           123.00 cm/s AV Vmean:          87.200 cm/s AV VTI:            0.174 m AV Peak Grad:      6.1  mmHg AV Mean Grad:      3.0 mmHg LVOT Vmax:         87.50 cm/s LVOT Vmean:        53.300 cm/s LVOT VTI:          0.112 m LVOT/AV VTI ratio: 0.65  AORTA Ao Root diam: 3.60 cm Ao Asc diam:  3.00 cm TRICUSPID VALVE TR Peak grad:   23.4 mmHg TR Vmax:        242.00 cm/s  SHUNTS Systemic VTI:  0.11 m Systemic Diam: 2.00 cm Adrian Prows MD Electronically signed by Adrian Prows MD Signature Date/Time: 10/27/2021/5:33:46 PM    Final    ECHO TEE  Result Date: 10/27/2021    TRANSESOPHOGEAL ECHO REPORT   Patient Name:   JAMESMICHAEL SHADD [facility] Date of Exam: 10/27/2021 Medical Rec #:  [id number]     Height:       72.0 in Accession #:    [id number]     Weight:       236.8 lb Date of Birth:  1961/08/05     BSA:          2.289 m Patient Age:    37 years      BP:           119/68 mmHg Patient Gender: M             HR:           82 bpm. Exam Location:  Inpatient Procedure: Transesophageal Echo, Cardiac Doppler and Color Doppler Indications:     endocarditis  History:         Patient has prior history of Echocardiogram examinations, most                  recent 10/26/2021. CHF, CAD, Pacemaker, Arrythmias:Atrial                  Fibrillation; Risk Factors:Diabetes.  Sonographer:     Johny Chess RDCS Referring Phys:  [place] Diagnosing Phys: Adrian Prows MD PROCEDURE: After discussion of the risks and benefits of a TEE, an informed consent was obtained from the patient. The transesophogeal probe was passed without difficulty through the esophogus of the patient. Imaged were obtained with the patient in a left lateral decubitus position. Local oropharyngeal anesthetic was provided with Cetacaine. Sedation performed by performing physician. The patient was monitored while under deep sedation. Patients was under conscious sedation during this procedure. Anesthetic administered: 60mg of Fentanyl, 2.044mof Versed. The patient developed no complications during the procedure. IMPRESSIONS  1. Left ventricular ejection fraction, by estimation, is 40 to 45%. The left ventricle has mildly decreased function. The left ventricle demonstrates global hypokinesis. There is moderate concentric left ventricular hypertrophy.  2. Right ventricular systolic function is normal. The right ventricular size is normal.  3. Left atrial size was moderately dilated. No left atrial/left atrial appendage thrombus was detected. The LAA emptying velocity was 30 cm/s.  4. Right atrial size was mildly dilated. There is a 1x1cm mass attached to the RA pacemaker lead suggestive of thrombus or a vegetation.  5. The mitral valve is normal in structure. Mild to moderate mitral valve regurgitation.  No evidence of mitral stenosis.  6. There is a 2cmx1cm mass attached to the RV lead of the pacemaker suggestive of a vegetation or a thrombus. The Anterior and septal TV leaflets are thickened and suggests involvement with the vegetation. The septal leaflet is flail. The presence of flail leaflet  suggests vegetation. . The tricuspid valve is degenerative. Tricuspid valve regurgitation is severe.  7. The aortic valve is normal in structure. Aortic valve regurgitation is not visualized. No aortic stenosis is present.  8. There is mild (Grade II) plaque involving the ascending aorta, aortic arch and descending aorta. Comparison(s): A prior study was performed on 05/24/2120. Previously TTE had revealed TV was normal and mild TR. LVEF was normal. FINDINGS  Left Ventricle: Left ventricular ejection fraction, by estimation, is 40 to 45%. The left ventricle has mildly decreased function. The left ventricle demonstrates global hypokinesis. The left ventricular internal cavity size was normal in size. There is  moderate concentric left ventricular hypertrophy. Abnormal (paradoxical) septal motion, consistent with RV pacemaker. Right Ventricle: The right ventricular size is normal. No increase in right ventricular wall thickness. Right ventricular systolic function is normal. Left Atrium: Left atrial size was moderately dilated. No left atrial/left atrial appendage thrombus was detected. The LAA emptying velocity was 30 cm/s. Right Atrium: Right atrial size was mildly dilated. Prominent Eustachian valve and There is a 1x1cm mass attached to the RA pacemaker lead suggestive of thrombus or a vegetation. Pericardium: Trivial pericardial effusion is present. Mitral Valve: The mitral valve is normal in structure. Mild to moderate mitral valve regurgitation. No evidence of mitral valve stenosis. Tricuspid Valve: There is a 2cmx1cm mass attached to the RV lead of the pacemaker suggestive of a vegetation or a thrombus. The Anterior  and septal TV leaflets are thickened and suggests involvement with the vegetation. The septal leaflet is flail. The presence of flail leaflet suggests vegetation. The tricuspid valve is degenerative in appearance. Tricuspid valve regurgitation is severe. Aortic Valve: The aortic valve is normal in structure. Aortic valve regurgitation is not visualized. No aortic stenosis is present. Pulmonic Valve: The pulmonic valve was normal in structure. Pulmonic valve regurgitation is trivial. Aorta: The aortic root is normal in size and structure. There is mild (Grade II) plaque involving the ascending aorta, aortic arch and descending aorta. IAS/Shunts: No atrial level shunt detected by color flow Doppler. Adrian Prows MD Electronically signed by Adrian Prows MD Signature Date/Time: 10/27/2021/5:28:28 PM    Final    US Abdomen Limited RUQ (LIVER/GB)  Result Date: 10/29/2021 CLINICAL DATA:  Transaminitis EXAM: ULTRASOUND ABDOMEN LIMITED RIGHT UPPER QUADRANT COMPARISON:  CT abdomen pelvis 09/17/2019 FINDINGS: Gallbladder: No gallstones or wall thickening visualized. Moderate amount of sludge. No sonographic Murphy sign noted by sonographer. Common bile duct: Diameter: 1.1 cm, dilated. Liver: No focal lesion identified. Within normal limits in parenchymal echogenicity. Portal vein is patent on color Doppler imaging with normal direction of blood flow towards the liver. Other: None. IMPRESSION: 1. Nonspecific dilation of the common bile duct measuring 1.1 cm. If there is concern for biliary duct obstruction consider MRI/MRCP. 2.  Moderate amount of sludge in the gallbladder. 3.  Normal sonographic appearance of the liver. Electronically Signed   By: Audie Pinto M.D.   On: 10/29/2021 12:35    Lab Data:  CBC: Recent Labs  Lab 10/25/21 1828 10/26/21 1055 10/28/21 0140 10/29/21 0240  WBC 13.0* 10.8* 16.0* 22.0*  NEUTROABS 11.4*  --   --   --   HGB 8.9* 7.1* 7.4* 8.0*  HCT 29.2* 23.3* 24.4* 27.4*  MCV 88.8 87.9  88.4 91.6  PLT 97* 91* 136* 160   Basic Metabolic Panel: Recent Labs  Lab 10/25/21 1828 10/26/21 1055 10/28/21 0140 10/29/21 0852  NA 127* 130* 131* 130*  K 3.8 3.6 4.4 4.2  CL 87* 92* 94* 94*  CO2 _0 GLUCOSE 133* 148* 164* 134*  BUN 42* 36* 44* 54*  CREATININE 4.55* 3.65* 4.23* 5.34*  CALCIUM 7.9* 7.3* 7.8* 7.8*  PHOS  --  4.3  --   --    GFR: Estimated Creatinine Clearance: 18.4 mL/min (A) (by C-G formula based on SCr of 5.34 mg/dL (H)). Liver Function Tests: Recent Labs  Lab 10/25/21 1828 10/28/21 0140 10/29/21 0852  AST 92* 240* 180*  ALT 73* 164* 174*  ALKPHOS 82 93 80  BILITOT 1.7* 1.4* 1.8*  PROT 6.5 5.6* 5.5*  ALBUMIN 2.1* 2.0* 1.8*   No results for input(s): LIPASE, AMYLASE in the last 168 hours. Recent Labs  Lab 10/29/21 0852  AMMONIA 21   Coagulation Profile: No results for input(s): INR, PROTIME in the last 168 hours. Cardiac Enzymes: No results for input(s): CKTOTAL, CKMB, CKMBINDEX, TROPONINI in the last 168 hours. BNP (last 3 results) No results for input(s): PROBNP in the last 8760 hours. HbA1C: Recent Labs    10/29/21 0852  HGBA1C 6.8*   CBG: Recent Labs  Lab 10/28/21 2233 10/28/21 2254 10/28/21 2318 10/29/21 0740 10/29/21 1111  GLUCAP 48* 49* 161* 148* 135*   Lipid Profile: No results for input(s): CHOL, HDL, LDLCALC, TRIG, CHOLHDL, LDLDIRECT in the last 72 hours. Thyroid Function Tests: No results for input(s): TSH, T4TOTAL, FREET4, T3FREE, THYROIDAB in the last 72 hours. Anemia Panel: No results for input(s): VITAMINB12, FOLATE, FERRITIN, TIBC, IRON, RETICCTPCT in the last 72 hours. Urine analysis:    Component Value Date/Time   COLORURINE YELLOW 05/23/2020 2057   APPEARANCEUR CLEAR 05/23/2020 2057   LABSPEC 1.008 05/23/2020 2057   PHURINE 5.0 05/23/2020 2057   GLUCOSEU 50 (A) 05/23/2020 2057   HGBUR LARGE (A) 05/23/2020 2057   BILIRUBINUR NEGATIVE 05/23/2020 2057   BILIRUBINUR neg 10/01/2014 1445    KETONESUR NEGATIVE 05/23/2020 2057   PROTEINUR 100 (A) 05/23/2020 2057   UROBILINOGEN 0.2 10/01/2014 1445   UROBILINOGEN 0.2 09/30/2014 2051   NITRITE NEGATIVE 05/23/2020 2057   LEUKOCYTESUR NEGATIVE 05/23/2020 2057     Destanie Tibbetts M.D. [facility] 10/29/2021, 2:44 PM  Available via Epic secure chat 7am-7pm After 7 pm, please refer to night coverage provider listed on amion.

## 2021-10-29 NOTE — Progress Notes (Signed)
[facility] Progress Note   Subjective:  Seen in room. Didn't get dialysis yesterday -staff had difficulty cannulating fistula. Will attempt to dialyze today.  No new complaints this am, still has cough. No cp, dyspnea. Irritable because TV isn't working.   Objective Vitals:   10/28/21 0930 10/28/21 1057 10/28/21 2200 10/29/21 0445  BP: (!) (P) 95/48 103/63 132/72   Pulse:  86 (!) 109   Resp: (P) 16 16 17    Temp: (P) 98.1 F (36.7 C) 97.7 F (36.5 C) 97.7 F (36.5 C) 99.1 F (37.3 C)  TempSrc:  Oral Oral Oral  SpO2:  95% 95% 93%  Weight:      Height:         Additional Objective Labs: Basic Metabolic Panel: Recent Labs  Lab 10/25/21 1828 10/26/21 1055 10/28/21 0140  NA 127* 130* 131*  K 3.8 3.6 4.4  CL 87* 92* 94*  CO2 25 27 26   GLUCOSE 133* 148* 164*  BUN 42* 36* 44*  CREATININE 4.55* 3.65* 4.23*  CALCIUM 7.9* 7.3* 7.8*  PHOS  --  4.3  --     CBC: Recent Labs  Lab 10/25/21 1828 10/26/21 1055 10/28/21 0140 10/29/21 0240  WBC 13.0* 10.8* 16.0* 22.0*  NEUTROABS 11.4*  --   --   --   HGB 8.9* 7.1* 7.4* 8.0*  HCT 29.2* 23.3* 24.4* 27.4*  MCV 88.8 87.9 88.4 91.6  PLT 97* 91* 136* 153    Blood Culture    Component Value Date/Time   SDES BLOOD LEFT ARM 10/27/2021 1036   SPECREQUEST  10/27/2021 1036    BOTTLES DRAWN AEROBIC AND ANAEROBIC Blood Culture adequate volume   CULT  10/27/2021 1036    NO GROWTH 1 DAY Performed at [facility], [place] [address] [place], [place] [address]    REPTSTATUS PENDING 10/27/2021 1036     Physical Exam General: Well appearing, nad  Heart: Irregular, irregular  No m,r,g  Lungs: faint crackles at bases  Abdomen: soft non-tender  Extremities: 1+ pitting edema LLE  Dialysis Access: RUE AVF +bruit   Medications:  sodium chloride     sodium chloride     sodium chloride 10 mL/hr at 10/27/21 1845   ceFEPime (MAXIPIME) IV     ceFEPime (MAXIPIME) IV     vancomycin     vancomycin      apixaban   5 mg Oral BID   calcitRIOL  0.5 mcg Oral Q T,Th,Sa-HD   calcitRIOL  0.5 mcg Oral Once   darbepoetin (ARANESP) injection - DIALYSIS  200 mcg Intravenous Q Thu-HD   diltiazem  120 mg Oral Daily   feeding supplement (NEPRO CARB STEADY)  237 mL Oral BID BM   furosemide  80 mg Oral Once per day on Sun Mon Wed Fri   insulin aspart  0-6 Units Subcutaneous TID WC   insulin aspart protamine- aspart  30 Units Subcutaneous BID WC   lidocaine  1 patch Transdermal Q24H   mouth rinse  15 mL Mouth Rinse BID   metoprolol tartrate  50 mg Oral BID   multivitamin  1 tablet Oral QHS   vancomycin variable dose per unstable renal function (pharmacist dosing)   Does not apply See admin instructions    Dialysis Orders:  GO TTS  4h 79min  108.5kg   400/800   2/2.5 bath  TDC   AVF RUA Hep none  - mircera 225 last 12/1 - calcitriol 0.5 ug tiw    Assessment/Plan:  PNA - w/ cough, mild SOB, RLL infiltrate on CXR.  On antibiotics.  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 Repeat blood cultures ordered.  Cardiology following  ESRD - on HD TTS. Difficulty cannulating AVF yesterday. Re-attempt dialysis today pending no other urgent cases come in. If continued difficulty will consult IR for f'gram. He states has not had trouble with fistula at outpatient center.  Volume - + LLE edema, probably losing body wt (hx poor appetite for 1-2 mos). Will have lower dry weight at discharge.  HTN - takes only metropolol, which may be for afib Afib/ RVR - Rate controlled, on BB, Cardizem, Eliquis per cardiology  Echo 12/15 -- EF 45%,  Large tricuspid valve veg , severe TVR - not a good candidate for surgery per Dr. [name]  DM2 - on insulin Anemia ckd - Hb 8.0 , due to esa, ordering darbe 200 weekly on Thursday -dosed on 12/15. Transfuse prn.  MBD ckd - Corr Ca/Phos ok.  cont vdra  Lynnda Child PA-C [facility] 10/29/2021,9:21  AM

## 2021-10-30 ENCOUNTER — Inpatient Hospital Stay: Payer: Medicare Other | Admitting: Family

## 2021-10-30 ENCOUNTER — Inpatient Hospital Stay: Payer: Medicare Other

## 2021-10-30 DIAGNOSIS — R161 Splenomegaly, not elsewhere classified: Secondary | ICD-10-CM

## 2021-10-30 DIAGNOSIS — D649 Anemia, unspecified: Secondary | ICD-10-CM

## 2021-10-30 DIAGNOSIS — Z950 Presence of cardiac pacemaker: Secondary | ICD-10-CM

## 2021-10-30 DIAGNOSIS — N186 End stage renal disease: Secondary | ICD-10-CM

## 2021-10-30 DIAGNOSIS — R7401 Elevation of levels of liver transaminase levels: Secondary | ICD-10-CM

## 2021-10-30 DIAGNOSIS — K838 Other specified diseases of biliary tract: Secondary | ICD-10-CM

## 2021-10-30 LAB — GLUCOSE, CAPILLARY
Glucose-Capillary: 147 mg/dL — ABNORMAL HIGH (ref 70–99)
Glucose-Capillary: 165 mg/dL — ABNORMAL HIGH (ref 70–99)
Glucose-Capillary: 177 mg/dL — ABNORMAL HIGH (ref 70–99)
Glucose-Capillary: 269 mg/dL — ABNORMAL HIGH (ref 70–99)
Glucose-Capillary: 270 mg/dL — ABNORMAL HIGH (ref 70–99)

## 2021-10-30 LAB — COMPREHENSIVE METABOLIC PANEL
ALT: 115 U/L — ABNORMAL HIGH (ref 0–44)
AST: 71 U/L — ABNORMAL HIGH (ref 15–41)
Albumin: 1.7 g/dL — ABNORMAL LOW (ref 3.5–5.0)
Alkaline Phosphatase: 74 U/L (ref 38–126)
Anion gap: 10 (ref 5–15)
BUN: 31 mg/dL — ABNORMAL HIGH (ref 8–23)
CO2: 27 mmol/L (ref 22–32)
Calcium: 7.9 mg/dL — ABNORMAL LOW (ref 8.9–10.3)
Chloride: 96 mmol/L — ABNORMAL LOW (ref 98–111)
Creatinine, Ser: 3.58 mg/dL — ABNORMAL HIGH (ref 0.61–1.24)
GFR, Estimated: 18 mL/min — ABNORMAL LOW (ref >60)
Glucose, Bld: 221 mg/dL — ABNORMAL HIGH (ref 70–99)
Potassium: 4.2 mmol/L (ref 3.5–5.1)
Sodium: 133 mmol/L — ABNORMAL LOW (ref 135–145)
Total Bilirubin: 1.9 mg/dL — ABNORMAL HIGH (ref 0.3–1.2)
Total Protein: 5.4 g/dL — ABNORMAL LOW (ref 6.5–8.1)

## 2021-10-30 LAB — PROTIME-INR
INR: 3 — ABNORMAL HIGH (ref 0.8–1.2)
Prothrombin Time: 30.9 seconds — ABNORMAL HIGH (ref 11.4–15.2)

## 2021-10-30 LAB — AMMONIA: Ammonia: 20 umol/L (ref 9–35)

## 2021-10-30 LAB — FERRITIN: Ferritin: 2418 ng/mL — ABNORMAL HIGH (ref 24–336)

## 2021-10-30 LAB — CBC
HCT: 22.1 % — ABNORMAL LOW (ref 39.0–52.0)
Hemoglobin: 6.7 g/dL — CL (ref 13.0–17.0)
MCH: 27 pg (ref 26.0–34.0)
MCHC: 30.3 g/dL (ref 30.0–36.0)
MCV: 89.1 fL (ref 80.0–100.0)
Platelets: 143 10*3/uL — ABNORMAL LOW (ref 150–400)
RBC: 2.48 MIL/uL — ABNORMAL LOW (ref 4.22–5.81)
RDW: 20.5 % — ABNORMAL HIGH (ref 11.5–15.5)
WBC: 14.3 10*3/uL — ABNORMAL HIGH (ref 4.0–10.5)
nRBC: 0.5 % — ABNORMAL HIGH (ref 0.0–0.2)

## 2021-10-30 LAB — PREPARE RBC (CROSSMATCH)

## 2021-10-30 LAB — HEPATITIS B CORE ANTIBODY, TOTAL: Hep B Core Total Ab: NONREACTIVE

## 2021-10-30 MED ORDER — BENZONATATE 100 MG PO CAPS
200.0000 mg | ORAL_CAPSULE | Freq: Three times a day (TID) | ORAL | Status: DC
  Administered 2021-10-30 – 2021-11-06 (×20): 200 mg via ORAL
  Filled 2021-10-30 (×20): qty 2

## 2021-10-30 MED ORDER — SODIUM CHLORIDE 0.9% IV SOLUTION: Freq: Once | INTRAVENOUS | Status: DC

## 2021-10-30 MED ORDER — LACTULOSE 10 GM/15ML PO SOLN: 30.0000 g | Freq: Two times a day (BID) | ORAL | Status: DC | PRN

## 2021-10-30 NOTE — Telephone Encounter (Signed)
error 

## 2021-10-30 NOTE — Progress Notes (Signed)
Pharmacy Antibiotic Note  Alexander [name] is a 60 y.o. male admitted on 10/25/2021 with SOB and found to have vegetation on TV close to PPM lead. Pharmacy has been consulted for vancomycin and cefepime dosing. Pt is ESRD on HD TTS, last session was 12/18. Received vancomycin and cefepime doses appropriately after HD session.   Plan: Continue Cefepime 2g IV qHD TTS Continue Vancomycin 1000mg  qHD TTS Follow HD schedule, cultures.   Height: 6' (182.9 cm) Weight: 107.9 kg (237 lb 14 oz) IBW/kg (Calculated) : 77.6  Temp (24hrs), Avg:98.2 F (36.8 C), Min:97.4 F (36.3 C), Max:99.1 F (37.3 C)  Recent Labs  Lab 10/25/21 1828 10/26/21 1055 10/28/21 0140 10/29/21 0240 10/29/21 0852  WBC 13.0* 10.8* 16.0* 22.0*  --   CREATININE 4.55* 3.65* 4.23*  --  5.34*     Estimated Creatinine Clearance: 18.2 mL/min (A) (by C-G formula based on SCr of 5.34 mg/dL (H)).    Allergies  Allergen Reactions   Cytoxan [Cyclophosphamide] Other (See Comments)    Pancytopenia   Keflex [Cephalexin] Nausea And Vomiting   Tylenol [Acetaminophen] Other (See Comments)    Makes the patient sweat   Pletal [Cilostazol] Palpitations and Other (See Comments)    "Can hear heart beating loudly".    Antimicrobials this admission: Ceftriaxone x1 12/14  Doxycycline 12/14 >> 12/16 Vancomycin 12/16 >> Cefepime 12/16 >>  Microbiology results: 12/16 BCx: ngtd  Thank you for allowing pharmacy to be a part of this patients care.   [place] Crane, Roylene Reason, BCCP Clinical Pharmacist  10/30/2021 11:10 AM   [facility] pharmacy phone numbers are listed on [email or web address]

## 2021-10-30 NOTE — Progress Notes (Addendum)
[facility] Progress Note   Subjective:    Patient seen and examined at bedside. Reports mild dyspnea with productive cough-not in resp distress. Denies CP. Also reporting shaking movements. Apparently having issues with cannulation previously but able to receive HD yesterday. Tolerated net UF 2L. Plan for HD 10/31/21.   Objective Vitals:   10/30/21 0456 10/30/21 0548 10/30/21 0630 10/30/21 0814  BP: (!) 130/97 115/70  (!) 88/59  Pulse:    96  Resp:  15 16 15   Temp: (!) 97.4 F (36.3 C) 98.2 F (36.8 C)  98.2 F (36.8 C)  TempSrc: (P) Oral Oral  Oral  SpO2: (P) 98% 91%  90%  Weight: 108 kg  107.9 kg   Height:       Physical Exam General: Well-appearing; NAD Heart: Irregular; No murmurs, gallops, or rubs Lungs: Noted fine crackles LLL; Clear in uppers; No wheezing or rhonchi Abdomen: Soft, non-tender, active bowel sounds Extremities: Noted trace edema BL thighs; no edema LE; mild myoclonic jerking noted BLUE Dialysis Access: R AVF (+) Bruit/Thrill   Filed Weights   10/29/21 2357 10/30/21 0456 10/30/21 0630  Weight: 110.2 kg 108 kg 107.9 kg    Intake/Output Summary (Last 24 hours) at 10/30/2021 0847 Last data filed at 10/30/2021 0816 Gross per 24 hour  Intake 477 ml  Output 2000 ml  Net -1523 ml    Additional Objective Labs: Basic Metabolic Panel: Recent Labs  Lab 10/26/21 1055 10/28/21 0140 10/29/21 0852  NA 130* 131* 130*  K 3.6 4.4 4.2  CL 92* 94* 94*  CO2 27 26 25   GLUCOSE 148* 164* 134*  BUN 36* 44* 54*  CREATININE 3.65* 4.23* 5.34*  CALCIUM 7.3* 7.8* 7.8*  PHOS 4.3  --   --    Liver Function Tests: Recent Labs  Lab 10/25/21 1828 10/28/21 0140 10/29/21 0852  AST 92* 240* 180*  ALT 73* 164* 174*  ALKPHOS 82 93 80  BILITOT 1.7* 1.4* 1.8*  PROT 6.5 5.6* 5.5*  ALBUMIN 2.1* 2.0* 1.8*   No results for input(s): LIPASE, AMYLASE in the last 168 hours. CBC: Recent Labs  Lab 10/25/21 1828 10/26/21 1055 10/28/21 0140  10/29/21 0240  WBC 13.0* 10.8* 16.0* 22.0*  NEUTROABS 11.4*  --   --   --   HGB 8.9* 7.1* 7.4* 8.0*  HCT 29.2* 23.3* 24.4* 27.4*  MCV 88.8 87.9 88.4 91.6  PLT 97* 91* 136* 153   Blood Culture    Component Value Date/Time   SDES BLOOD LEFT ARM 10/27/2021 1036   SPECREQUEST  10/27/2021 1036    BOTTLES DRAWN AEROBIC AND ANAEROBIC Blood Culture adequate volume   CULT  10/27/2021 1036    NO GROWTH 2 DAYS Performed at [facility] [address], [place], [place] [address]    REPTSTATUS PENDING 10/27/2021 1036    Cardiac Enzymes: No results for input(s): CKTOTAL, CKMB, CKMBINDEX, TROPONINI in the last 168 hours. CBG: Recent Labs  Lab 10/29/21 1111 10/29/21 1613 10/29/21 2049 10/30/21 0552 10/30/21 0751  GLUCAP 135* 142* 210* 147* 165*   Iron Studies: No results for input(s): IRON, TIBC, TRANSFERRIN, FERRITIN in the last 72 hours. Lab Results  Component Value Date   INR 1.2 07/22/2020   INR 1.2 05/26/2020   INR 1.7 (H) 03/11/2020   Studies/Results: CT LUMBAR SPINE WO CONTRAST  Result Date: 10/28/2021 CLINICAL DATA:  Lumbar radiculopathy. Back pain with persistent symptoms for over 6 weeks of treatment. EXAM: CT LUMBAR SPINE WITHOUT CONTRAST TECHNIQUE:  Multidetector CT imaging of the lumbar spine was performed without intravenous contrast administration. Multiplanar CT image reconstructions were also generated. COMPARISON:  09/16/2021 FINDINGS: Segmentation: 5 lumbar type vertebrae Alignment: Physiologic Vertebrae: Interval endplate fractures at L2 inferiorly, L3 superiorly, and L5 inferiorly. These have a nonacute appearance but may be incompletely healed. No overt endplate destruction, lucency in the L2 and L3 bodies is adjacent to the fracture deformities. Paraspinal and other soft tissues: Severe atheromatous plaque of the aorta and branch vessels. Small right pleural effusion. Body wall edema. Disc levels: T12- L1: Unremarkable. L1-L2: Disc narrowing with spondylitic  and facet spurring. No noted impingement L2-L3: Disc narrowing and bulging. Facet spurring and ligamentum flavum thickening with severe spinal stenosis. Moderate bilateral foraminal narrowing asymmetric to the right L3-L4: Disc narrowing and bulging with endplate spurring. Laminectomy with patent spinal canal L4-L5: Disc narrowing and bulging with endplate spurring. Laminectomy. L5-S1:Disc collapse with endplate and facet spurring. Moderate foraminal narrowing on both sides. IMPRESSION: 1. Subacute appearing endplate fractures at L2, L3, and L5. These have occurred since November 2022. 2. Lumbar spine degeneration exacerbated by short pedicles. Advanced spinal stenosis at L2-3. Electronically Signed   By: Jorje Guild M.D.   On: 10/28/2021 11:45   US Abdomen Limited RUQ (LIVER/GB)  Result Date: 10/29/2021 CLINICAL DATA:  Transaminitis EXAM: ULTRASOUND ABDOMEN LIMITED RIGHT UPPER QUADRANT COMPARISON:  CT abdomen pelvis 09/17/2019 FINDINGS: Gallbladder: No gallstones or wall thickening visualized. Moderate amount of sludge. No sonographic Murphy sign noted by sonographer. Common bile duct: Diameter: 1.1 cm, dilated. Liver: No focal lesion identified. Within normal limits in parenchymal echogenicity. Portal vein is patent on color Doppler imaging with normal direction of blood flow towards the liver. Other: None. IMPRESSION: 1. Nonspecific dilation of the common bile duct measuring 1.1 cm. If there is concern for biliary duct obstruction consider MRI/MRCP. 2.  Moderate amount of sludge in the gallbladder. 3.  Normal sonographic appearance of the liver. Electronically Signed   By: Audie Pinto M.D.   On: 10/29/2021 12:35    Medications:  sodium chloride 10 mL/hr at 10/27/21 1845   ceFEPime (MAXIPIME) IV     vancomycin      apixaban  5 mg Oral BID   calcitRIOL  0.5 mcg Oral Q T,Th,Sa-HD   darbepoetin (ARANESP) injection - DIALYSIS  200 mcg Intravenous Q Thu-HD   diltiazem  120 mg Oral Daily    feeding supplement (NEPRO CARB STEADY)  237 mL Oral BID BM   furosemide  80 mg Oral Once per day on Sun Mon Wed Fri   insulin aspart  0-6 Units Subcutaneous TID WC   lidocaine  1 patch Transdermal Q24H   mouth rinse  15 mL Mouth Rinse BID   metoprolol tartrate  50 mg Oral BID   multivitamin  1 tablet Oral QHS   vancomycin variable dose per unstable renal function (pharmacist dosing)   Does not apply See admin instructions    Dialysis Orders: GO TTS  4h 60min  108.5kg   400/800   2/2.5 bath  TDC   AVF RUA Hep none  - mircera 225 last 12/1 - calcitriol 0.5 ug tiw   Assessment/Plan: PNA - w/ cough, mild SOB, RLL infiltrate on CXR.  On antibiotics.  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 Repeat blood cultures ordered.  Cardiology following  ESRD - on HD TTS. Difficulty cannulating AVF previously. Re-attempt dialysis yesterday-tolerated yesterday's HD with  net UF 2L. If continued difficulty will consult IR for f'gram. He states has not had trouble with fistula at outpatient center. Plan for HD 10/31/21. Noted mild myoclonic jerking within bilateral upper extremities-he doesn't appear to be uremic. Currently receiving Cefepime and Vanc-will monitor closely. May need to stop Cefepime and switch to something else. Will discuss with Attending today.  Alexander [name] reports that he has had this shaking and myoclonic jerking for the past 3 months.  He is not on gabapentin and doubt cefepime or vanc are causative since the symptoms predated this admission.  His LFT's are elevated and will check an ammonia level.  He will need outpatient neurologic workup. Volume - + edema BL thighs but no edema LE probably losing body wt (hx poor appetite for 1-2 mos). Will have lower dry weight at discharge.  HTN - BP trend soft but stable currently. Takes only metropolol, which may be for afib Afib/ RVR - Rate controlled, on BB, Cardizem,  Eliquis per cardiology  Echo 12/15 -- EF 45%,  Large tricuspid valve veg , severe TVR - not a good candidate for surgery per Dr. [name]  DM2 - on insulin Anemia ckd - Hb 8.0 , due to esa, ordering darbe 200 weekly on Thursday -dosed on 12/15-next dose due 12/22. Transfuse prn.  MBD ckd - Corr Ca/Phos ok.  cont vdra  Tobie Poet, NP [facility] 10/30/2021,8:47 AM  LOS: 5 days    I have seen and examined this patient and agree with plan and assessment in the above note with renal recommendations/intervention highlighted.  Broadus John A Shjon Lizarraga,MD 10/30/2021 1:53 PM

## 2021-10-30 NOTE — Progress Notes (Signed)
PT Cancellation Note  Patient Details Name: Alexander [name] MRN: [id number] DOB: 1961-03-19   Cancelled Treatment:    Reason Eval/Treat Not Completed: Patient declined, no reason specified Pt adamantly declining physical therapy evaluation reporting he is tired and in pain (was premedicated).  [name], PT, DPT Acute Rehabilitation Services Pager [phone] Office [phone]    [name] 10/30/2021, 3:02 PM

## 2021-10-30 NOTE — Progress Notes (Addendum)
[facility]  Date of Admission:  10/25/2021      Lines: Right UE chronic AVF pacemaker  Abx: 12/16-c cefepime 12/16-c vanc  12/14-16 ceftriaxone 12/14-16 doxycycline  ASSESSMENT: 60 yo male with CAD, s/p pacemaker, esrd on iHD via rue avf, afib, admitted initially for presumed pna, found to have TEE evidence of TV/pacemaker vegetations and subacute lower back pain with ct finding L2,3,5 end plate fx  Initial bcx after 2 days empiric abx, so far negative From description of the TV appearance, appears syndrome c/w endocarditis/lead infection both High risk surgical removal of pacer. Agree reasonable to try abx therapy. If worsening sepsis or new bacteremia despite what appears to be appropriate abx therapy, could revisit this topic  Lower back pain with ct showing subacute lumbar end plate fracture without abscess... ct done without contrast. Presuming infection involved tx already should cover   12/19 assessment Some mild improvement Reports at least a few weeks sporadic upper limbs jerking. Unlikely related to cefepime which was only started 12/16.  One data point of wbc increase without associated clinical suggestion of untreated infectious focus Lft elevation at admission likely from sepsis; trending down. RUQ u/s no evidence stone; no clinical syndrome cholecystitis/cholangitis or liver abscess  PLAN: Continue dialysis tiw dosed vanc/cefepime.  If continued clinical stability/improvement, would plan 6 weeks treatment with above then chronic oral abx suppression at least for 6 months after If worsening leukocytosis/lft, would consider repeating lumbar imaging and also abdominal pelvis imaging to r/o developing abscess   Principal Problem:   Tricuspid valve vegetation Active Problems:   Diabetes mellitus with end stage renal disease (HCC)   PVD (peripheral vascular disease) (HCC)   CAD (coronary artery disease), native coronary  artery   Pacemaker: Medtronic Advisa DR MRI [id number] Dual chamber pacemaker 10/08/2014   ESRD (end stage renal disease) ([facility])   Atrial fibrillation with RVR (HCC)   Thrombocytopenia (HCC)   Hyponatremia   Acute on chronic diastolic CHF (congestive heart failure) (HCC)   Allergies  Allergen Reactions   Cytoxan [Cyclophosphamide] Other (See Comments)    Pancytopenia   Keflex [Cephalexin] Nausea And Vomiting   Tylenol [Acetaminophen] Other (See Comments)    Makes the patient sweat   Pletal [Cilostazol] Palpitations and Other (See Comments)    "Can hear heart beating loudly".    Scheduled Meds:  apixaban  5 mg Oral BID   calcitRIOL  0.5 mcg Oral Q T,Th,Sa-HD   darbepoetin (ARANESP) injection - DIALYSIS  200 mcg Intravenous Q Thu-HD   diltiazem  120 mg Oral Daily   feeding supplement (NEPRO CARB STEADY)  237 mL Oral BID BM   furosemide  80 mg Oral Once per day on Sun Mon Wed Fri   insulin aspart  0-6 Units Subcutaneous TID WC   lidocaine  1 patch Transdermal Q24H   mouth rinse  15 mL Mouth Rinse BID   metoprolol tartrate  50 mg Oral BID   multivitamin  1 tablet Oral QHS   vancomycin variable dose per unstable renal function (pharmacist dosing)   Does not apply See admin instructions   Continuous Infusions:  sodium chloride 10 mL/hr at 10/27/21 1845   ceFEPime (MAXIPIME) IV     vancomycin     PRN Meds:.acetaminophen, guaiFENesin-dextromethorphan, methocarbamol, oxyCODONE-acetaminophen   SUBJECTIVE: A few weeks at least periodic jerking of upper limbs Feels a little better since admission but still "sick" No new focal joint/back pain  Review of Systems: ROS All other ROS was negative, except mentioned above     OBJECTIVE: Vitals:   10/30/21 0630 10/30/21 0814 10/30/21 1002 10/30/21 1143  BP:  (!) 88/59 (!) 116/95 115/70  Pulse:  96 96 93  Resp: 16 15  18   Temp:  98.2 F (36.8 C)  97.6 F (36.4 C)  TempSrc:  Oral  Oral  SpO2:  90%  97%  Weight: 107.9 kg      Height:       Body mass index is 32.26 kg/m.  Physical Exam General/constitutional: obese, no distress, chronically ill appearing HEENT: Normocephalic, PER, Conj Clear, EOMI, Oropharynx clear; poor dentition Neck supple CV: irregular rate no mrg Lungs: clear to auscultation, normal respiratory effort Abd: Soft, Nontender Ext: no edema Skin: No Rash Neuro: nonfocal MSK: no peripheral joint swelling/tenderness/warmth; old right bka/left tma     Lab Results Lab Results  Component Value Date   WBC 22.0 (H) 10/29/2021   HGB 8.0 (L) 10/29/2021   HCT 27.4 (L) 10/29/2021   MCV 91.6 10/29/2021   PLT 153 10/29/2021    Lab Results  Component Value Date   CREATININE 5.34 (H) 10/29/2021   BUN 54 (H) 10/29/2021   NA 130 (L) 10/29/2021   K 4.2 10/29/2021   CL 94 (L) 10/29/2021   CO2 25 10/29/2021    Lab Results  Component Value Date   ALT 174 (H) 10/29/2021   AST 180 (H) 10/29/2021   ALKPHOS 80 10/29/2021   BILITOT 1.8 (H) 10/29/2021      Microbiology: Recent Results (from the past 240 hour(s))  Resp Panel by RT-PCR (Flu A&B, Covid) Nasopharyngeal Swab     Status: None   Collection Time: 10/25/21  6:32 PM   Specimen: Nasopharyngeal Swab; Nasopharyngeal(NP) swabs in vial transport medium  Result Value Ref Range Status   SARS Coronavirus 2 by RT PCR NEGATIVE NEGATIVE Final    Comment: (NOTE) SARS-CoV-2 target nucleic acids are NOT DETECTED.  The SARS-CoV-2 RNA is generally detectable in upper respiratory specimens during the acute phase of infection. The lowest concentration of SARS-CoV-2 viral copies this assay can detect is 138 copies/mL. A negative result does not preclude SARS-Cov-2 infection and should not be used as the sole basis for treatment or other patient management decisions. A negative result may occur with  improper specimen collection/handling, submission of specimen other than nasopharyngeal swab, presence of viral mutation(s) within the areas  targeted by this assay, and inadequate number of viral copies(<138 copies/mL). A negative result must be combined with clinical observations, patient history, and epidemiological information. The expected result is Negative.  Fact Sheet for Patients:  EntrepreneurPulse.com.au  Fact Sheet for Healthcare Providers:  IncredibleEmployment.be  This test is no t yet approved or cleared by the [place] FDA and  has been authorized for detection and/or diagnosis of SARS-CoV-2 by FDA under an Emergency Use Authorization (EUA). This EUA will remain  in effect (meaning this test can be used) for the duration of the COVID-19 declaration under Section 564(b)(1) of the Act, 21 U.S.C.section 360bbb-3(b)(1), unless the authorization is terminated  or revoked sooner.       Influenza A by PCR NEGATIVE NEGATIVE Final   Influenza B by PCR NEGATIVE NEGATIVE Final    Comment: (NOTE) The Xpert Xpress SARS-CoV-2/FLU/RSV plus assay is intended as an aid in the diagnosis of influenza from Nasopharyngeal swab specimens and should not be used as a sole basis for treatment. Nasal washings and aspirates are  unacceptable for Xpert Xpress SARS-CoV-2/FLU/RSV testing.  Fact Sheet for Patients: EntrepreneurPulse.com.au  Fact Sheet for Healthcare Providers: IncredibleEmployment.be  This test is not yet approved or cleared by the [place] FDA and has been authorized for detection and/or diagnosis of SARS-CoV-2 by FDA under an Emergency Use Authorization (EUA). This EUA will remain in effect (meaning this test can be used) for the duration of the COVID-19 declaration under Section 564(b)(1) of the Act, 21 U.S.C. section 360bbb-3(b)(1), unless the authorization is terminated or revoked.  Performed at [facility], [place] [address] [place], [place] [address]   Culture, blood (Routine X 2) w Reflex to ID Panel     Status: None  (Preliminary result)   Collection Time: 10/27/21 10:19 AM   Specimen: Left Antecubital; Blood  Result Value Ref Range Status   Specimen Description LEFT ANTECUBITAL  Final   Special Requests   Final    BOTTLES DRAWN AEROBIC AND ANAEROBIC Blood Culture adequate volume   Culture   Final    NO GROWTH 3 DAYS Performed at [facility], [place] [address], [place], [place] [address]    Report Status PENDING  Incomplete  Culture, blood (Routine X 2) w Reflex to ID Panel     Status: None (Preliminary result)   Collection Time: 10/27/21 10:36 AM   Specimen: BLOOD LEFT ARM  Result Value Ref Range Status   Specimen Description BLOOD LEFT ARM  Final   Special Requests   Final    BOTTLES DRAWN AEROBIC AND ANAEROBIC Blood Culture adequate volume   Culture   Final    NO GROWTH 3 DAYS Performed at [facility], [place] [address], [place], [place] [address]    Report Status PENDING  Incomplete     Serology:   Imaging: If present, new imagings (plain films, ct scans, and mri) have been personally visualized and interpreted; radiology reports have been reviewed. Decision making incorporated into the Impression / Recommendations.  12/16 tee 1. Left ventricular ejection fraction, by estimation, is 40 to 45%. The  left ventricle has mildly decreased function. The left ventricle  demonstrates global hypokinesis. There is moderate concentric left  ventricular hypertrophy.   2. Right ventricular systolic function is normal. The right ventricular  size is normal.   3. Left atrial size was moderately dilated. No left atrial/left atrial  appendage thrombus was detected. The LAA emptying velocity was 30 cm/s.   4. Right atrial size was mildly dilated. There is a 1x1cm mass attached  to the RA pacemaker lead suggestive of thrombus or a vegetation.   5. The mitral valve is normal in structure. Mild to moderate mitral valve  regurgitation. No evidence of mitral stenosis.   6. There is a 2cmx1cm  mass attached to the RV lead of the pacemaker  suggestive of a vegetation or a thrombus. The Anterior and septal TV  leaflets are thickened and suggests involvement with the vegetation. The  septal leaflet is flail. The presence of  flail leaflet suggests vegetation. . The tricuspid valve is degenerative.  Tricuspid valve regurgitation is severe.   7. The aortic valve is normal in structure. Aortic valve regurgitation is  not visualized. No aortic stenosis is present.   8. There is mild (Grade II) plaque involving the ascending aorta, aortic  arch and descending aorta.     12/16 chest ct without contrast There is a almost complete interval clearing of small infiltrate in the left upper lobe and clearing of  small infiltrate in the right upper lobe.   There is interval appearance of large patchy alveolar infiltrates in the right lower lobe and to a lesser extent in the right middle lobe. Findings suggest large area of pneumonia.   There is interval appearance of small to moderate right pleural effusion part of which appears to be loculated in the periphery of right lower lung fields and within major fissure. Possibility of empyema is not excluded.   Other findings as described in the body of the report.    12/17 ct lumbar spine without contrast 1. Subacute appearing endplate fractures at L2, L3, and L5. These have occurred since November 2022. 2. Lumbar spine degeneration exacerbated by short pedicles. Advanced spinal stenosis at L2-3.    Jabier Mutton, [facility] [place] [phone] pager    10/30/2021, 12:45 PM

## 2021-10-30 NOTE — Care Management Important Message (Signed)
Important Message  Patient Details  Name: Alexander [name] MRN: [id number] Date of Birth: Jan 29, 1961   Medicare Important Message Given:  Yes     [name] 10/30/2021, 10:20 AM

## 2021-10-30 NOTE — Progress Notes (Addendum)
Inpatient Diabetes Program Recommendations  AACE/ADA: New Consensus Statement on Inpatient Glycemic Control (2015)  Target Ranges:  Prepandial:   less than 140 mg/dL      Peak postprandial:   less than 180 mg/dL (1-2 hours)      Critically ill patients:  140 - 180 mg/dL   Lab Results  Component Value Date   GLUCAP 177 (H) 10/30/2021   HGBA1C 6.8 (H) 10/29/2021    Review of Glycemic Control  Latest Reference Range & Units 10/29/21 20:49 10/30/21 05:52 10/30/21 07:51 10/30/21 11:40  Glucose-Capillary 70 - 99 mg/dL 210 (H) 147 (H) 165 (H) 177 (H)  (H): Data is abnormally high Diabetes history: DM 2 Outpatient Diabetes medications: Novolin 70/30 10-70 units bid- dosed by patient Current orders for Inpatient glycemic control:  Novolog (0-6 units) tid with meals   Inpatient Diabetes Program Recommendations:    Spoke with patient regarding outpatient diabetes management. Verified home dosages and patient reports, "I take what I think I need based off my own scale in my head. Reviewed patient's current A1c of 6.8%. Explained what a A1c is and what it measures and also reviewed that this could reflect increased hypoglycemia. Also reviewed goal A1c with patient, importance of good glucose control @ home, and blood sugar goals. Reviewed patho of DM, role of pancrease, impact of insulin administration with respect to renal disease, risk for hypoglycemia, interventions, vascular changes and commorbidities. Patient reports checking CBGs twice daily. Reviewed frequency of CBGs and reviewed when to reach out to MD. When asked specifically, patient reports frequent issues with hypoglycemia especially after taking large doses of 70/30 ~60-70 units at bedtime. Reviewed the impacts of taking large doses, current inpatient glucose trends vs insulin needs, importance of following prescriptions, the possibility of "holding onto" the insulin for longer periods of time, interventions, increased risk of mortality  and thoughts given patient receiving HD. Expresses understanding.  Patient denies consuming sugary beverages, however ordered grape juice with lunch. Reviewed alternatives and encourage CHO mindfulness.  In agreement with current orders at this time. Will need to change outpatient dosing for safety at discharge.  Thanks, Bronson Curb, MSN, RNC-OB Diabetes Coordinator [phone] (8a-5p)

## 2021-10-30 NOTE — Progress Notes (Incomplete)
Drs. Tana Coast ( Attending)  , Mansouraty  ( GI ) and Darrel Hoover ( nephrology)  updated with critical Hgb level at 6.7.

## 2021-10-30 NOTE — Progress Notes (Addendum)
[facility]                                                                              Patient Demographics  Alexander [name], is a 60 y.o. male, DOB - September 17, 1961, STM:[id number]  Admit date - 10/25/2021   Admitting Physician Orene Desanctis, DO  Outpatient Primary MD for the patient is Tisovec, [name], MD  Outpatient specialists:   LOS - 5  days   Medical records reviewed and are as summarized below:    Chief Complaint  Patient presents with   Shortness of Breath   Back Pain       Brief summary   Patient is a 60 year old male with ESRD on HD, TTS, paroxysmal A. fib on Eliquis, pacemaker, CAD status post stent, diastolic CHF, PVD with right BKA, prior history of GI bleed, diabetes mellitus type 2 presented with increasing shortness of breath and cough.  Per patient he was diagnosed with pneumonia in early November and completed course of antibiotics.  However he has not felt back to baseline since.  Has had persistent shortness of breath and coughing, fatigue.  Also noted some anterior and right lateral sided rib pain worse with coughing and worsening left lower extremity edema.  He did complete his HD last session prior to admission. In ED, patient was hypoxic down to 80%, was put on 2 L BNP 1000 181.  Chest x-ray showed right basilar pneumonia, he was placed on IV Rocephin and doxycycline.   Assessment & Plan    Principal Problem: Acute respiratory failure with hypoxia, multifactorial, acute on chronic diastolic CHF, community-acquired pneumonia, paroxysmal A. fib with RVR ([place]) -Patient was hypoxic in ED, sats 80%, placed on 2 L O2 -Chest x-ray showed clearing of prior LUL pneumonia, new hazy airspace disease and consolidation at the right base suspicion for right basilar pneumonia, moderate right pleural effusion, cardiomegaly -States was having coughing with phlegm otherwise no acute issues, sats 97% on room air.  Added Tessalon Perles and flutter valve,  continue Robitussin,  Active Problems: Acute on chronic diastolic CHF (congestive heart failure) (HCC) -Suspect worsened due to pneumonia and A. fib with RVR, has ESRD on HD -Had vascular access issues on 12/17 however able to dialyze yesterday.  Next dialysis tomorrow 12/20   Tricuspid valve Infective endocarditis -2D echo showed large 1.2x 1.3 x 1 cm tricuspid valve vegetation close to pacemaker lead -TEE on 12/16 showed 1x 1 cm vegetation attached to right atrium pacemaker lead.  Large vegetation attached to TEE measuring 2x 1 cm, also appears to be attached to RV pacemaker lead, severe TR.  Per Dr. [name], pacemaker extraction and reimplant of new leads will be a major issue with ongoing infection and high risk of embolization hence recommended medical therapy for now -CT L-spine showed subacute appearing endplate fractures of G9-2 and L5 since November 2022, does not appear to have discitis or abscess on CT. -ID following, appreciate recommendations, continue vancomycin, cefepime  Transaminitis -Unclear etiology, discontinued statins, LFTs slightly trending down today.  Ammonia level normal. -Hepatitis panel negative -RUQ US showed nonspecific dilation of the CBD measuring 1.1 cm, if  concern for biliary duct obstruction consider MRCP, moderate amount of sludge in gallbladder, normal sonographic appearance of liver.   -Currently no abdominal pain, fevers, jaundice, will recheck LFTs, CBC, ammonia level normal,   Anemia  -Hb 6.7 - no ongoing bleed, will give 1 unit pRBC - per staff and pt, had "bled a lot" during access issues.   Paroxysmal atrial fibrillation with RVR -Continue Eliquis.  Heart rate controlled.  Right lung pneumonia -Chest x-ray showed consolidation at the right base -Currently on vancomycin and cefepime  ESRD on hemodialysis, TTS -Underwent HD on 12/15, unsuccessful on 12/1 17 - underwent HD last night    Diabetes mellitus type II, IDDM with end stage renal  disease (HCC) -Hemoglobin A1c 7.8 on 11/5 -For now continue sliding scale insulin only Patient had hypoglycemia episodes on 12/18 with insulin 70/30, discontinued    PVD (peripheral vascular disease) ([place]), -Has history of right BKA   CAD (coronary artery disease), native coronary artery -Currently no acute chest pain, continue metoprolol, Lipitor, eliquis   Pacemaker: [employer]onic Advisa DR MRI [id number] Dual chamber pacemaker 10/08/2014  Anemia with thrombocytopenia (HCC) -Platelets normalized 153K today. -H&H stable  Acute on chronic hyponatremia -Possibly due to fluid overload, continue current management, -Sodium stable for now  Obesity Estimated body mass index is 32.26 kg/m as calculated from the following:   Height as of this encounter: 6' (1.829 m).   Weight as of this encounter: 107.9 kg.  Code Status: Full CODE STATUS DVT Prophylaxis:   apixaban (ELIQUIS) tablet 5 mg   Level of Care: Level of care: Telemetry Cardiac Family Communication: Discussed all imaging results, lab results, explained to the patient's wife in detail on the phone on 12/18  Disposition Plan:     Status is: Inpatient  Remains inpatient appropriate because: Acute respiratory failure with hypoxia, Infective endocarditis  Time Spent in minutes 25 minutes  Procedures:  2D echo TEE  Consultants:   Nephrology, Dr. Jonnie Finner Cardiology, Dr. [name] Infectious disease  Antimicrobials:   Anti-infectives (From admission, onward)    Start     Dose/Rate Route Frequency Ordered Stop   10/29/21 2100  ceFEPIme (MAXIPIME) 2 g in sodium chloride 0.9 % 100 mL IVPB        2 g 200 mL/hr over 30 Minutes Intravenous  Once 10/29/21 1445 10/30/21 0507   10/29/21 2100  vancomycin (VANCOCIN) IVPB 1000 mg/200 mL premix        1,000 mg 200 mL/hr over 60 Minutes Intravenous  Once 10/29/21 1445 10/30/21 0438   10/29/21 1200  vancomycin (VANCOCIN) IVPB 1000 mg/200 mL premix  Status:  Discontinued        1,000  mg 200 mL/hr over 60 Minutes Intravenous  Once 10/28/21 1422 10/29/21 1445   10/29/21 1200  ceFEPIme (MAXIPIME) 2 g in sodium chloride 0.9 % 100 mL IVPB  Status:  Discontinued        2 g 200 mL/hr over 30 Minutes Intravenous  Once 10/28/21 1422 10/29/21 1445   10/28/21 1414  vancomycin variable dose per unstable renal function (pharmacist dosing)         Does not apply See admin instructions 10/28/21 1422     10/28/21 1200  ceFEPIme (MAXIPIME) 2 g in sodium chloride 0.9 % 100 mL IVPB        2 g 200 mL/hr over 30 Minutes Intravenous Every T-Th-Sa (Hemodialysis) 10/27/21 1717     10/28/21 1200  vancomycin (VANCOCIN) IVPB 1000 mg/200 mL premix  1,000 mg 200 mL/hr over 60 Minutes Intravenous Every T-Th-Sa (Hemodialysis) 10/27/21 1717     10/27/21 2200  cefTRIAXone (ROCEPHIN) 1 g in sodium chloride 0.9 % 100 mL IVPB  Status:  Discontinued        1 g 200 mL/hr over 30 Minutes Intravenous Every 24 hours 10/26/21 0108 10/27/21 1653   10/27/21 1815  ceFEPIme (MAXIPIME) 2 g in sodium chloride 0.9 % 100 mL IVPB        2 g 200 mL/hr over 30 Minutes Intravenous  Once 10/27/21 1717 10/27/21 1920   10/27/21 1815  vancomycin (VANCOCIN) 2,500 mg in sodium chloride 0.9 % 500 mL IVPB        2,500 mg 250 mL/hr over 120 Minutes Intravenous  Once 10/27/21 1717 10/27/21 2202   10/25/21 2200  cefTRIAXone (ROCEPHIN) 2 g in sodium chloride 0.9 % 100 mL IVPB        2 g 200 mL/hr over 30 Minutes Intravenous  Once 10/25/21 2148 10/26/21 0029   10/25/21 2200  doxycycline (VIBRA-TABS) tablet 100 mg  Status:  Discontinued        100 mg Oral Every 12 hours 10/25/21 2148 10/27/21 1653          Medications  Scheduled Meds:  apixaban  5 mg Oral BID   benzonatate  200 mg Oral TID   calcitRIOL  0.5 mcg Oral Q T,Th,Sa-HD   darbepoetin (ARANESP) injection - DIALYSIS  200 mcg Intravenous Q Thu-HD   diltiazem  120 mg Oral Daily   feeding supplement (NEPRO CARB STEADY)  237 mL Oral BID BM   furosemide  80 mg  Oral Once per day on Sun Mon Wed Fri   insulin aspart  0-6 Units Subcutaneous TID WC   lidocaine  1 patch Transdermal Q24H   mouth rinse  15 mL Mouth Rinse BID   metoprolol tartrate  50 mg Oral BID   multivitamin  1 tablet Oral QHS   vancomycin variable dose per unstable renal function (pharmacist dosing)   Does not apply See admin instructions   Continuous Infusions:  sodium chloride 10 mL/hr at 10/27/21 1845   ceFEPime (MAXIPIME) IV     vancomycin     PRN Meds:.      Subjective:   Alexander [name] was seen and examined today.  + Lot of phlegm but no respiratory distress.  Underwent hemodialysis last night.  No fevers or chills.  No further hypoglycemia episodes.   Objective:   Vitals:   10/30/21 0630 10/30/21 0814 10/30/21 1002 10/30/21 1143  BP:  (!) 88/59 (!) 116/95 115/70  Pulse:  96 96 93  Resp: 16 15  18   Temp:  98.2 F (36.8 C)  97.6 F (36.4 C)  TempSrc:  Oral  Oral  SpO2:  90%  97%  Weight: 107.9 kg     Height:        Intake/Output Summary (Last 24 hours) at 10/30/2021 1444 Last data filed at 10/30/2021 0816 Gross per 24 hour  Intake 477 ml  Output 2000 ml  Net -1523 ml     Wt Readings from Last 3 Encounters:  10/30/21 107.9 kg  10/01/21 112.3 kg  09/20/21 117.9 kg   Physical Exam General: Alert and oriented x 3, NAD Cardiovascular: Irregular Respiratory: Mild scattered rhonchi, diminished at the bases Gastrointestinal: Soft, nontender, nondistended, NBS Ext: right BKA Neuro: no new deficits Psych: Normal affect and demeanor, alert and oriented x3     Data Reviewed:  I have  personally reviewed following labs and imaging studies  Micro Results Recent Results (from the past 240 hour(s))  Resp Panel by RT-PCR (Flu A&B, Covid) Nasopharyngeal Swab     Status: None   Collection Time: 10/25/21  6:32 PM   Specimen: Nasopharyngeal Swab; Nasopharyngeal(NP) swabs in vial transport medium  Result Value Ref Range Status   SARS Coronavirus 2 by RT PCR  NEGATIVE NEGATIVE Final    Comment: (NOTE) SARS-CoV-2 target nucleic acids are NOT DETECTED.  The SARS-CoV-2 RNA is generally detectable in upper respiratory specimens during the acute phase of infection. The lowest concentration of SARS-CoV-2 viral copies this assay can detect is 138 copies/mL. A negative result does not preclude SARS-Cov-2 infection and should not be used as the sole basis for treatment or other patient management decisions. A negative result may occur with  improper specimen collection/handling, submission of specimen other than nasopharyngeal swab, presence of viral mutation(s) within the areas targeted by this assay, and inadequate number of viral copies(<138 copies/mL). A negative result must be combined with clinical observations, patient history, and epidemiological information. The expected result is Negative.  Fact Sheet for Patients:  EntrepreneurPulse.com.au  Fact Sheet for Healthcare Providers:  IncredibleEmployment.be  This test is no t yet approved or cleared by the [place] FDA and  has been authorized for detection and/or diagnosis of SARS-CoV-2 by FDA under an Emergency Use Authorization (EUA). This EUA will remain  in effect (meaning this test can be used) for the duration of the COVID-19 declaration under Section 564(b)(1) of the Act, 21 U.S.C.section 360bbb-3(b)(1), unless the authorization is terminated  or revoked sooner.       Influenza A by PCR NEGATIVE NEGATIVE Final   Influenza B by PCR NEGATIVE NEGATIVE Final    Comment: (NOTE) The Xpert Xpress SARS-CoV-2/FLU/RSV plus assay is intended as an aid in the diagnosis of influenza from Nasopharyngeal swab specimens and should not be used as a sole basis for treatment. Nasal washings and aspirates are unacceptable for Xpert Xpress SARS-CoV-2/FLU/RSV testing.  Fact Sheet for Patients: EntrepreneurPulse.com.au  Fact Sheet for  Healthcare Providers: IncredibleEmployment.be  This test is not yet approved or cleared by the [place] FDA and has been authorized for detection and/or diagnosis of SARS-CoV-2 by FDA under an Emergency Use Authorization (EUA). This EUA will remain in effect (meaning this test can be used) for the duration of the COVID-19 declaration under Section 564(b)(1) of the Act, 21 U.S.C. section 360bbb-3(b)(1), unless the authorization is terminated or revoked.  Performed at [facility], [place] [address], [place], [place] [address]   Culture, blood (Routine X 2) w Reflex to ID Panel     Status: None (Preliminary result)   Collection Time: 10/27/21 10:19 AM   Specimen: Left Antecubital; Blood  Result Value Ref Range Status   Specimen Description LEFT ANTECUBITAL  Final   Special Requests   Final    BOTTLES DRAWN AEROBIC AND ANAEROBIC Blood Culture adequate volume   Culture   Final    NO GROWTH 3 DAYS Performed at [facility], [place] [address] [place], [place] [address]    Report Status PENDING  Incomplete  Culture, blood (Routine X 2) w Reflex to ID Panel     Status: None (Preliminary result)   Collection Time: 10/27/21 10:36 AM   Specimen: BLOOD LEFT ARM  Result Value Ref Range Status   Specimen Description BLOOD LEFT ARM  Final   Special Requests   Final  BOTTLES DRAWN AEROBIC AND ANAEROBIC Blood Culture adequate volume   Culture   Final    NO GROWTH 3 DAYS Performed at [facility], [place] [address] [place], [place] [address]    Report Status PENDING  Incomplete    Radiology Reports DG Chest 2 View  Result Date: 10/25/2021 CLINICAL DATA:  Shortness of breath EXAM: CHEST - 2 VIEW COMPARISON:  09/30/2021 FINDINGS: Left-sided pacing device as before. Cardiomegaly. Suspicion of moderate right pleural effusion. Hazy airspace disease and consolidation at the right base. Aortic atherosclerosis. No pneumothorax. IMPRESSION: 1. Clearing of  previously noted left upper lobe pneumonia 2. New hazy airspace disease and consolidation at right base suspicious for right basilar pneumonia. Suspect that there is moderate right pleural effusion 3. Cardiomegaly Electronically Signed   By: Donavan Foil M.D.   On: 10/25/2021 19:28   CT CHEST WO CONTRAST  Result Date: 10/27/2021 CLINICAL DATA:  Pneumonia EXAM: CT CHEST WITHOUT CONTRAST TECHNIQUE: Multidetector CT imaging of the chest was performed following the standard protocol without IV contrast. COMPARISON:  Previous studies including the radiographs done on 10/25/2021 and CT done on 09/16/2021 FINDINGS: Cardiovascular: Coronary artery calcifications are seen. Heart is enlarged in size. Biventricular pacer leads are noted in place. Mediastinum/Nodes: No significant lymphadenopathy seen. Lungs/Pleura: There is significant interval decrease in patchy infiltrate in the left upper lobe. Small nodular infiltrate seen in the right upper lung fields in the previous study could not be distinctly identified. There is interval appearance of large patchy alveolar infiltrates in the right lower lobe and to a lesser extent in the right middle lobe. There is small to moderate right pleural effusion. Part of the effusion appears to be loculated in the interlobar fissure. There is no pneumothorax. Upper Abdomen: Stomach is distended with fluid. Musculoskeletal: Unremarkable. IMPRESSION: There is a almost complete interval clearing of small infiltrate in the left upper lobe and clearing of small infiltrate in the right upper lobe. There is interval appearance of large patchy alveolar infiltrates in the right lower lobe and to a lesser extent in the right middle lobe. Findings suggest large area of pneumonia. There is interval appearance of small to moderate right pleural effusion part of which appears to be loculated in the periphery of right lower lung fields and within major fissure. Possibility of empyema is not  excluded. Other findings as described in the body of the report. Electronically Signed   By: Elmer Picker M.D.   On: 10/27/2021 19:19   CT LUMBAR SPINE WO CONTRAST  Result Date: 10/28/2021 CLINICAL DATA:  Lumbar radiculopathy. Back pain with persistent symptoms for over 6 weeks of treatment. EXAM: CT LUMBAR SPINE WITHOUT CONTRAST TECHNIQUE: Multidetector CT imaging of the lumbar spine was performed without intravenous contrast administration. Multiplanar CT image reconstructions were also generated. COMPARISON:  09/16/2021 FINDINGS: Segmentation: 5 lumbar type vertebrae Alignment: Physiologic Vertebrae: Interval endplate fractures at L2 inferiorly, L3 superiorly, and L5 inferiorly. These have a nonacute appearance but may be incompletely healed. No overt endplate destruction, lucency in the L2 and L3 bodies is adjacent to the fracture deformities. Paraspinal and other soft tissues: Severe atheromatous plaque of the aorta and branch vessels. Small right pleural effusion. Body wall edema. Disc levels: T12- L1: Unremarkable. L1-L2: Disc narrowing with spondylitic and facet spurring. No noted impingement L2-L3: Disc narrowing and bulging. Facet spurring and ligamentum flavum thickening with severe spinal stenosis. Moderate bilateral foraminal narrowing asymmetric to the right L3-L4: Disc narrowing and bulging with endplate spurring. Laminectomy  with patent spinal canal L4-L5: Disc narrowing and bulging with endplate spurring. Laminectomy. L5-S1:Disc collapse with endplate and facet spurring. Moderate foraminal narrowing on both sides. IMPRESSION: 1. Subacute appearing endplate fractures at L2, L3, and L5. These have occurred since November 2022. 2. Lumbar spine degeneration exacerbated by short pedicles. Advanced spinal stenosis at L2-3. Electronically Signed   By: Jorje Guild M.D.   On: 10/28/2021 11:45   ECHOCARDIOGRAM COMPLETE  Result Date: 10/27/2021    ECHOCARDIOGRAM REPORT   Patient Name:    COLBE VIVIANO [facility] Date of Exam: 10/26/2021 Medical Rec #:  [id number]     Height:       72.0 in Accession #:    [id number]    Weight:       247.5 lb Date of Birth:  1961-05-14     BSA:          2.333 m Patient Age:    60 years      BP:           115/52 mmHg Patient Gender: M             HR:           117 bpm. Exam Location:  Inpatient Procedure: 2D Echo, Cardiac Doppler, Color Doppler and Intracardiac            Opacification Agent Indications:     CHF  History:         Patient has prior history of Echocardiogram examinations, most                  recent 05/24/2020. Pacemaker, Arrythmias:Atrial Fibrillation and                  Tachycardia, Signs/Symptoms:Shortness of Breath; Risk                  Factors:Hypertension and Diabetes. Pneumonia.  Sonographer:     Merrie Roof RDCS Referring Phys:  [id number] [facility] Diagnosing Phys: Adrian Prows MD IMPRESSIONS  1. Left ventricular ejection fraction, by estimation, is 40 to 45%. The left ventricle has mildly decreased function. The left ventricle demonstrates global hypokinesis. There is severe concentric left ventricular hypertrophy. Left ventricular diastolic  parameters are indeterminate.  2. Right ventricular systolic function is normal. The right ventricular size is normal. There is mildly elevated pulmonary artery systolic pressure. The estimated right ventricular systolic pressure is 79.8 mmHg.  3. Left atrial size was moderately dilated.  4. Right atrial size was mildly dilated.  5. The mitral valve is normal in structure. Moderate mitral valve regurgitation.  6. Vegetation or a thrombus measuring 1.2x1.2 cm seen on the RV lead of the pacemaker. Leaflet involvement cannot be excluded. . The tricuspid valve is degenerative. Tricuspid valve regurgitation is moderate to severe.  7. The aortic valve is normal in structure. Aortic valve regurgitation is not visualized. No aortic stenosis is present.  8. The inferior vena cava is dilated in size with <50% respiratory  variability, suggesting right atrial pressure of 15 mmHg. FINDINGS  Left Ventricle: Left ventricular ejection fraction, by estimation, is 40 to 45%. The left ventricle has mildly decreased function. The left ventricle demonstrates global hypokinesis. The left ventricular internal cavity size was normal in size. There is  severe concentric left ventricular hypertrophy. Abnormal (paradoxical) septal motion, consistent with RV pacemaker. Left ventricular diastolic parameters are indeterminate. Right Ventricle: The right ventricular size is normal. No increase in right ventricular wall thickness. Right ventricular systolic function is  normal. There is mildly elevated pulmonary artery systolic pressure. The tricuspid regurgitant velocity is 2.42  m/s, and with an assumed right atrial pressure of 15 mmHg, the estimated right ventricular systolic pressure is 32.6 mmHg. Left Atrium: Left atrial size was moderately dilated. Right Atrium: Right atrial size was mildly dilated. Pericardium: There is no evidence of pericardial effusion. Mitral Valve: The mitral valve is normal in structure. There is moderate thickening of the mitral valve leaflet(s). Moderate mitral valve regurgitation. Tricuspid Valve: Vegetation or a thrombus measuring 1.2x1.2 cm seen on the RV lead of the pacemaker. Leaflet involvement cannot be excluded. The tricuspid valve is degenerative in appearance. Tricuspid valve regurgitation is moderate to severe. Aortic Valve: The aortic valve is normal in structure. Aortic valve regurgitation is not visualized. No aortic stenosis is present. Aortic valve mean gradient measures 3.0 mmHg. Aortic valve peak gradient measures 6.1 mmHg. Aortic valve area, by VTI measures 2.03 cm. Pulmonic Valve: The pulmonic valve was normal in structure. Pulmonic valve regurgitation is not visualized. No evidence of pulmonic stenosis. Aorta: The aortic root is normal in size and structure. Venous: The inferior vena cava is dilated in  size with less than 50% respiratory variability, suggesting right atrial pressure of 15 mmHg. IAS/Shunts: No atrial level shunt detected by color flow Doppler.  LEFT VENTRICLE PLAX 2D LVIDd:         3.80 cm LVIDs:         3.00 cm LV PW:         1.70 cm LV IVS:        1.70 cm LVOT diam:     2.00 cm LV SV:         35 LV SV Index:   15 LVOT Area:     3.14 cm  LV Volumes (MOD) LV vol d, MOD A2C: 145.0 ml LV vol d, MOD A4C: 131.0 ml LV vol s, MOD A2C: 56.7 ml LV vol s, MOD A4C: 109.0 ml LV SV MOD A2C:     88.3 ml LV SV MOD A4C:     131.0 ml LV SV MOD BP:      62.4 ml RIGHT VENTRICLE             IVC RV Basal diam:  4.70 cm     IVC diam: 2.30 cm RV Mid diam:    4.60 cm RV S prime:     14.30 cm/s TAPSE (M-mode): 1.8 cm LEFT ATRIUM              Index        RIGHT ATRIUM           Index LA diam:        3.50 cm  1.50 cm/m   RA Area:     41.40 cm LA Vol (A2C):   100.0 ml 42.87 ml/m  RA Volume:   174.00 ml 74.60 ml/m LA Vol (A4C):   100.0 ml 42.87 ml/m LA Biplane Vol: 107.0 ml 45.87 ml/m  AORTIC VALVE AV Area (Vmax):    2.23 cm AV Area (Vmean):   1.92 cm AV Area (VTI):     2.03 cm AV Vmax:           123.00 cm/s AV Vmean:          87.200 cm/s AV VTI:            0.174 m AV Peak Grad:      6.1 mmHg AV Mean Grad:  3.0 mmHg LVOT Vmax:         87.50 cm/s LVOT Vmean:        53.300 cm/s LVOT VTI:          0.112 m LVOT/AV VTI ratio: 0.65  AORTA Ao Root diam: 3.60 cm Ao Asc diam:  3.00 cm TRICUSPID VALVE TR Peak grad:   23.4 mmHg TR Vmax:        242.00 cm/s  SHUNTS Systemic VTI:  0.11 m Systemic Diam: 2.00 cm Adrian Prows MD Electronically signed by Adrian Prows MD Signature Date/Time: 10/27/2021/5:33:46 PM    Final    ECHO TEE  Result Date: 10/27/2021    TRANSESOPHOGEAL ECHO REPORT   Patient Name:   QUINTARIUS FERNS [facility] Date of Exam: 10/27/2021 Medical Rec #:  [id number]     Height:       72.0 in Accession #:    [id number]    Weight:       236.8 lb Date of Birth:  1961/02/01     BSA:          2.289 m Patient Age:    52 years       BP:           119/68 mmHg Patient Gender: M             HR:           82 bpm. Exam Location:  Inpatient Procedure: Transesophageal Echo, Cardiac Doppler and Color Doppler Indications:     endocarditis  History:         Patient has prior history of Echocardiogram examinations, most                  recent 10/26/2021. CHF, CAD, Pacemaker, Arrythmias:Atrial                  Fibrillation; Risk Factors:Diabetes.  Sonographer:     Johny Chess RDCS Referring Phys:  [place] Diagnosing Phys: Adrian Prows MD PROCEDURE: After discussion of the risks and benefits of a TEE, an informed consent was obtained from the patient. The transesophogeal probe was passed without difficulty through the esophogus of the patient. Imaged were obtained with the patient in a left lateral decubitus position. Local oropharyngeal anesthetic was provided with Cetacaine. Sedation performed by performing physician. The patient was monitored while under deep sedation. Patients was under conscious sedation during this procedure. Anesthetic administered: 12mcg of Fentanyl, 2.0mg  of Versed. The patient developed no complications during the procedure. IMPRESSIONS  1. Left ventricular ejection fraction, by estimation, is 40 to 45%. The left ventricle has mildly decreased function. The left ventricle demonstrates global hypokinesis. There is moderate concentric left ventricular hypertrophy.  2. Right ventricular systolic function is normal. The right ventricular size is normal.  3. Left atrial size was moderately dilated. No left atrial/left atrial appendage thrombus was detected. The LAA emptying velocity was 30 cm/s.  4. Right atrial size was mildly dilated. There is a 1x1cm mass attached to the RA pacemaker lead suggestive of thrombus or a vegetation.  5. The mitral valve is normal in structure. Mild to moderate mitral valve regurgitation. No evidence of mitral stenosis.  6. There is a 2cmx1cm mass attached to the RV lead of the pacemaker  suggestive of a vegetation or a thrombus. The Anterior and septal TV leaflets are thickened and suggests involvement with the vegetation. The septal leaflet is flail. The presence of flail leaflet suggests vegetation. . The tricuspid valve is degenerative. Tricuspid  valve regurgitation is severe.  7. The aortic valve is normal in structure. Aortic valve regurgitation is not visualized. No aortic stenosis is present.  8. There is mild (Grade II) plaque involving the ascending aorta, aortic arch and descending aorta. Comparison(s): A prior study was performed on 05/24/2120. Previously TTE had revealed TV was normal and mild TR. LVEF was normal. FINDINGS  Left Ventricle: Left ventricular ejection fraction, by estimation, is 40 to 45%. The left ventricle has mildly decreased function. The left ventricle demonstrates global hypokinesis. The left ventricular internal cavity size was normal in size. There is  moderate concentric left ventricular hypertrophy. Abnormal (paradoxical) septal motion, consistent with RV pacemaker. Right Ventricle: The right ventricular size is normal. No increase in right ventricular wall thickness. Right ventricular systolic function is normal. Left Atrium: Left atrial size was moderately dilated. No left atrial/left atrial appendage thrombus was detected. The LAA emptying velocity was 30 cm/s. Right Atrium: Right atrial size was mildly dilated. Prominent Eustachian valve and There is a 1x1cm mass attached to the RA pacemaker lead suggestive of thrombus or a vegetation. Pericardium: Trivial pericardial effusion is present. Mitral Valve: The mitral valve is normal in structure. Mild to moderate mitral valve regurgitation. No evidence of mitral valve stenosis. Tricuspid Valve: There is a 2cmx1cm mass attached to the RV lead of the pacemaker suggestive of a vegetation or a thrombus. The Anterior and septal TV leaflets are thickened and suggests involvement with the vegetation. The septal leaflet  is flail. The presence of flail leaflet suggests vegetation. The tricuspid valve is degenerative in appearance. Tricuspid valve regurgitation is severe. Aortic Valve: The aortic valve is normal in structure. Aortic valve regurgitation is not visualized. No aortic stenosis is present. Pulmonic Valve: The pulmonic valve was normal in structure. Pulmonic valve regurgitation is trivial. Aorta: The aortic root is normal in size and structure. There is mild (Grade II) plaque involving the ascending aorta, aortic arch and descending aorta. IAS/Shunts: No atrial level shunt detected by color flow Doppler. Adrian Prows MD Electronically signed by Adrian Prows MD Signature Date/Time: 10/27/2021/5:28:28 PM    Final    US Abdomen Limited RUQ (LIVER/GB)  Result Date: 10/29/2021 CLINICAL DATA:  Transaminitis EXAM: ULTRASOUND ABDOMEN LIMITED RIGHT UPPER QUADRANT COMPARISON:  CT abdomen pelvis 09/17/2019 FINDINGS: Gallbladder: No gallstones or wall thickening visualized. Moderate amount of sludge. No sonographic Murphy sign noted by sonographer. Common bile duct: Diameter: 1.1 cm, dilated. Liver: No focal lesion identified. Within normal limits in parenchymal echogenicity. Portal vein is patent on color Doppler imaging with normal direction of blood flow towards the liver. Other: None. IMPRESSION: 1. Nonspecific dilation of the common bile duct measuring 1.1 cm. If there is concern for biliary duct obstruction consider MRI/MRCP. 2.  Moderate amount of sludge in the gallbladder. 3.  Normal sonographic appearance of the liver. Electronically Signed   By: Audie Pinto M.D.   On: 10/29/2021 12:35    Lab Data:  CBC: Recent Labs  Lab 10/25/21 1828 10/26/21 1055 10/28/21 0140 10/29/21 0240  WBC 13.0* 10.8* 16.0* 22.0*  NEUTROABS 11.4*  --   --   --   HGB 8.9* 7.1* 7.4* 8.0*  HCT 29.2* 23.3* 24.4* 27.4*  MCV 88.8 87.9 88.4 91.6  PLT 97* 91* 136* 696   Basic Metabolic Panel: Recent Labs  Lab 10/25/21 1828  10/26/21 1055 10/28/21 0140 10/29/21 0852  NA 127* 130* 131* 130*  K 3.8 3.6 4.4 4.2  CL 87* 92* 94* 94*  CO2 25  27 26 25   GLUCOSE 133* 148* 164* 134*  BUN 42* 36* 44* 54*  CREATININE 4.55* 3.65* 4.23* 5.34*  CALCIUM 7.9* 7.3* 7.8* 7.8*  PHOS  --  4.3  --   --    GFR: Estimated Creatinine Clearance: 18.2 mL/min (A) (by C-G formula based on SCr of 5.34 mg/dL (H)). Liver Function Tests: Recent Labs  Lab 10/25/21 1828 10/28/21 0140 10/29/21 0852  AST 92* 240* 180*  ALT 73* 164* 174*  ALKPHOS 82 93 80  BILITOT 1.7* 1.4* 1.8*  PROT 6.5 5.6* 5.5*  ALBUMIN 2.1* 2.0* 1.8*   No results for input(s): LIPASE, AMYLASE in the last 168 hours. Recent Labs  Lab 10/29/21 0852  AMMONIA 21   Coagulation Profile: No results for input(s): INR, PROTIME in the last 168 hours. Cardiac Enzymes: No results for input(s): CKTOTAL, CKMB, CKMBINDEX, TROPONINI in the last 168 hours. BNP (last 3 results) No results for input(s): PROBNP in the last 8760 hours. HbA1C: Recent Labs    10/29/21 0852  HGBA1C 6.8*   CBG: Recent Labs  Lab 10/29/21 1613 10/29/21 2049 10/30/21 0552 10/30/21 0751 10/30/21 1140  GLUCAP 142* 210* 147* 165* 177*   Lipid Profile: No results for input(s): CHOL, HDL, LDLCALC, TRIG, CHOLHDL, LDLDIRECT in the last 72 hours. Thyroid Function Tests: No results for input(s): TSH, T4TOTAL, FREET4, T3FREE, THYROIDAB in the last 72 hours. Anemia Panel: No results for input(s): VITAMINB12, FOLATE, FERRITIN, TIBC, IRON, RETICCTPCT in the last 72 hours. Urine analysis:    Component Value Date/Time   COLORURINE YELLOW 05/23/2020 2057   APPEARANCEUR CLEAR 05/23/2020 2057   LABSPEC 1.008 05/23/2020 2057   PHURINE 5.0 05/23/2020 2057   GLUCOSEU 50 (A) 05/23/2020 2057   HGBUR LARGE (A) 05/23/2020 2057   BILIRUBINUR NEGATIVE 05/23/2020 2057   BILIRUBINUR neg 10/01/2014 1445   KETONESUR NEGATIVE 05/23/2020 2057   PROTEINUR 100 (A) 05/23/2020 2057   UROBILINOGEN 0.2  10/01/2014 1445   UROBILINOGEN 0.2 09/30/2014 2051   NITRITE NEGATIVE 05/23/2020 2057   LEUKOCYTESUR NEGATIVE 05/23/2020 2057     Reia Viernes M.D. [facility] 10/30/2021, 2:44 PM  Available via Epic secure chat 7am-7pm After 7 pm, please refer to night coverage provider listed on amion.

## 2021-10-30 NOTE — Consult Note (Signed)
Consultation  Referring Provider:    Dr. Estill Cotta Primary Care Physician:  [name], MD Primary Gastroenterologist:   Dr. Zenovia Jarred Reason for Consultation: Elevated liver function         HPI:   Alexander [name] is a 60 y.o. male with history significant for end-stage renal disease on HD, paroxysmal atrial fibrillation on Eliquis, pacemaker, coronary artery disease status post stent, diastolic heart failure, peripheral vascular disease sp right BKA, history of type 2 diabetes presented to the hospital 10/25/2021 with shortness of breath and cough. Found to have large vegetation on tricuspid valve and pacemaker lead noted on TEE , seen by infectious disease started on Vancomycin and cefepime (switched from ceftriaxone 10/27/2021, was on doxycycline outpatient for pneumonia in November), concern for pulmonary septic emboli. Most recent blood cultures 10/27/2021 preliminary results show no growth. Patient continues on Eliquis last dose 10/30/2021.  Patient is on diltiazem, has been on oxycodone with acetaminophen, patient is on Lasix, was on Lipitor 20 mg discontinued yesterday.  Patient on admission 10/25/2021 had AST of 92 which increased to 240 on 10/28/2021 is decreased to 180 on 10/29/2021. Acute hepatitis negative ammonia level 21 yesterday  Patient denies abdominal pain prior to this admission.  None nausea vomiting dysphagia.  Denies reflux. Never had yellowing of eyes or skin.  Does state he has dark urine. Has bruising. Denies family history of gallbladder issues or liver issues. Patient states drinks about 20 years, beer and wine last drink was last summer. Used to smoke quit about a year ago. Denies any IV drug use, tattoos, high risk sexual behavior blood transfusions. Last BM 4 days ago has been having gas since that time, states he has had constipation for at least 2 to 4 weeks. Decreased appetite  Previous GI work up: AB [place] RUQ  10/29/2021 IMPRESSION: 1. Nonspecific dilation of the common bile duct measuring 1.1 cm. If there is concern for biliary duct obstruction consider MRI/MRCP. 2.  Moderate amount of sludge in the gallbladder. 3.  Normal sonographic appearance of the liver.  CT AB and Pelvis 09/16/2021 IMPRESSION: 1. Small amount of ascites noted, along with deep mesenteric edema particularly in the pelvis and some presacral edema. I do not observe definite bowel wall thickening, pneumatosis, or a specific bowel abnormality. 2. Extensive atherosclerosis. Coronary atherosclerosis. Aortic Atherosclerosis (ICD10-I70.0). 3. Mild cardiomegaly with small pericardial effusion. 4. Splenomegaly. 5. Possible cholelithiasis. 6. Umbilical hernia contains adipose tissue.   Enteroscopy 07/20/2020 for anemia and hemoccult positive stools - No gross lesions in esophagus. Z-line regular, 45 cm from the incisors. - Erosive gastropathy with no bleeding and no stigmata of recent bleeding. No other gross lesions in the stomach. Biopsied. - Erythematous duodenopathy in Bulb/D1/D2. Otherwise normal mucosa was found in the entire examined duodenum. Melanosis noted. - Normal mucosa was found in the visualized proximal jejunum. Melanosis noted. Tattooed distal extent of SBE today. - Successful placement of the Video Capsule.  Colonoscopy 05/25/2020 Unremarkable, no biopsy  Past Medical History:  Diagnosis Date   Alcohol abuse    Anemia    Arthritis    "patient does not think so."   Atrial fibrillation ([place])    Cardiac syncope 10/07/2014   rapid A fib with 8 sec pauses on converison with syncope- temp pacing wire placed then PPM   Cataract    BILATERAL   Coronary artery disease    Diabetes mellitus    dx---been  awhile   ESRD (  end stage renal disease) ([place])    [place]   GERD (gastroesophageal reflux disease)    History of blood transfusion    Hyperlipemia    Hypertension    Osteomyelitis  (HCC)    right foot   Peripheral vascular disease ([place])    Presence of permanent cardiac pacemaker 10/08/2014   Medtronic    Past Surgical History:  Procedure Laterality Date   A/V FISTULAGRAM Right 05/01/2021   Procedure: A/V FISTULAGRAM;  Surgeon: Waynetta Sandy, MD;  Location: [place] [facility];  Service: Cardiovascular;  Laterality: Right;  failed fistulagram   ABDOMINAL AORTOGRAM W/LOWER EXTREMITY N/A 09/09/2017   Procedure: ABDOMINAL AORTOGRAM W/LOWER EXTREMITY;  Surgeon: Angelia Mould, MD;  Location: [place] [facility];  Service: Cardiovascular;  Laterality: N/A;   ABDOMINAL AORTOGRAM W/LOWER EXTREMITY Right 11/11/2019   Procedure: ABDOMINAL AORTOGRAM W/LOWER EXTREMITY;  Surgeon: Marty Heck, MD;  Location: [place] [facility];  Service: Cardiovascular;  Laterality: Right;   AMPUTATION Left 08/19/2013   Procedure: AMPUTATION RAY;  Surgeon: Alta Corning, MD;  Location: [place];  Service: Orthopedics;  Laterality: Left;  ray amputation left 5th   AMPUTATION Left 10/27/2013   Procedure: AMPUTATION DIGIT-LEFT 4TH TOE, 4th and 5th metatarsal.;  Surgeon: Angelia Mould, MD;  Location: [place];  Service: Vascular;  Laterality: Left;   AMPUTATION Left 11/04/2013   Procedure: LEFT FOOT TRANS-METATARSAL AMPUTATION WITH WOUND CLOSURE ;  Surgeon: Alta Corning, MD;  Location: [place];  Service: Orthopedics;  Laterality: Left;   AMPUTATION Right 09/10/2017   Procedure: RIGHT FOURTH AND FIFTH TOE AMPUTATION;  Surgeon: Angelia Mould, MD;  Location: [place];  Service: Vascular;  Laterality: Right;   AMPUTATION Right 02/19/2020   Procedure: AMPUTATION RIGHT 3RD TOE;  Surgeon: Angelia Mould, MD;  Location: [place];  Service: Vascular;  Laterality: Right;   AMPUTATION Right 03/11/2020   Procedure: RIGHT BELOW KNEE AMPUTATION;  Surgeon: Newt Minion, MD;  Location: [place];  Service: Orthopedics;  Laterality: Right;   APPLICATION OF WOUND VAC Right  02/19/2020   Procedure: Application Of Wound Vac Right foot;  Surgeon: Angelia Mould, MD;  Location: [place];  Service: Vascular;  Laterality: Right;   AV FISTULA PLACEMENT Right 03/27/2021   Procedure: RIGHT ARM ARTERIOVENOUS (AV) FISTULA;  Surgeon: Angelia Mould, MD;  Location: [place];  Service: Vascular;  Laterality: Right;   AV FISTULA PLACEMENT Right 05/10/2021   Procedure: RIGHT ARM ARTERIOVENOUS (AV) FISTULA;  Surgeon: Waynetta Sandy, MD;  Location: [place];  Service: Vascular;  Laterality: Right;   [place] Right 08/11/2021   Procedure: RIGHT ARM SECOND STAGE BASILIC VEIN TRANSPOSITION;  Surgeon: Waynetta Sandy, MD;  Location: [place];  Service: Vascular;  Laterality: Right;   BIOPSY  07/20/2020   Procedure: BIOPSY;  Surgeon: Irving Copas., MD;  Location: [place];  Service: Gastroenterology;;   CARDIAC CATHETERIZATION  10/07/2014   Procedure: LEFT HEART CATH AND CORONARY ANGIOGRAPHY;  Surgeon: Laverda Page, MD;  Location: [facility] CATH LAB;  Service: Cardiovascular;;   COLONOSCOPY W/ POLYPECTOMY     COLONOSCOPY WITH PROPOFOL N/A 05/25/2020   Procedure: COLONOSCOPY WITH PROPOFOL;  Surgeon: Gatha Mayer, MD;  Location: [facility];  Service: Endoscopy;  Laterality: N/A;   EMBOLECTOMY Right 09/12/2017   Procedure: Thrombectomy  & Redo Right Below Knee Popliteal Artery Bypass Graft  ;  Surgeon: Waynetta Sandy,  MD;  Location: [facility];  Service: Vascular;  Laterality: Right;   ENTEROSCOPY N/A 07/20/2020   Procedure: ENTEROSCOPY;  Surgeon: Rush Landmark Telford Nab., MD;  Location: [place];  Service: Gastroenterology;  Laterality: N/A;   ESOPHAGOGASTRODUODENOSCOPY (EGD) WITH PROPOFOL N/A 05/24/2020   Procedure: ESOPHAGOGASTRODUODENOSCOPY (EGD) WITH PROPOFOL;  Surgeon: Ladene Artist, MD;  Location: [facility];  Service: Endoscopy;  Laterality: N/A;   EYE SURGERY Bilateral    cataract    FEMORAL-TIBIAL BYPASS GRAFT Left 10/27/2013   Procedure: BYPASS GRAFT LEFT FEMORAL- POSTERIOR TIBIAL ARTERY;  Surgeon: Angelia Mould, MD;  Location: [place];  Service: Vascular;  Laterality: Left;   FEMORAL-TIBIAL BYPASS GRAFT Right 09/10/2017   Procedure: RIGHT SUPERFICIAL  FEMORAL ARTERY-BELOW KNEE POPLITEAL ARTERY BYPASS GRAFT WITH VEIN;  Surgeon: Angelia Mould, MD;  Location: [place];  Service: Vascular;  Laterality: Right;   GIVENS CAPSULE STUDY N/A 07/20/2020   Procedure: GIVENS CAPSULE STUDY;  Surgeon: Irving Copas., MD;  Location: [place];  Service: Gastroenterology;  Laterality: N/A;  capsule placed through scope into duodenum @ 0936   I & D EXTREMITY Left 10/27/2013   Procedure: IRRIGATION AND DEBRIDEMENT EXTREMITY- LEFT FOOT;  Surgeon: Angelia Mould, MD;  Location: [place];  Service: Vascular;  Laterality: Left;   I & D EXTREMITY Right 01/26/2020   Procedure: IRRIGATION AND DEBRIDEMENT EXTREMITY RIGHT FOOT WOUND;  Surgeon: Angelia Mould, MD;  Location: [place];  Service: Vascular;  Laterality: Right;   INSERT / REPLACE / REMOVE PACEMAKER     INTRAOPERATIVE ARTERIOGRAM Right 09/10/2017   Procedure: INTRA OPERATIVE ARTERIOGRAM;  Surgeon: Angelia Mould, MD;  Location: [place];  Service: Vascular;  Laterality: Right;   IR ANGIOGRAM FOLLOW UP STUDY  09/09/2017   IR FLUORO GUIDE CV LINE RIGHT  05/31/2020   IR US GUIDE VASC ACCESS RIGHT  06/01/2020   LEFT HEART CATH AND CORONARY ANGIOGRAPHY N/A 08/19/2020   Procedure: LEFT HEART CATH AND CORONARY ANGIOGRAPHY;  Surgeon: Adrian Prows, MD;  Location: [place] [facility];  Service: Cardiovascular;  Laterality: N/A;   LEFT HEART CATHETERIZATION WITH CORONARY ANGIOGRAM N/A 11/09/2014   Procedure: LEFT HEART CATHETERIZATION WITH CORONARY ANGIOGRAM;  Surgeon: Laverda Page, MD;  Location: [facility] CATH LAB;  Service: Cardiovascular;  Laterality: N/A;   LOWER EXTREMITY ANGIOGRAM Right 11/08/2015   Procedure:  Lower Extremity Angiogram;  Surgeon: Serafina Mitchell, MD;  Location: [place] [facility];  Service: Cardiovascular;  Laterality: Right;   LUMBAR LAMINECTOMY  06/13/2012   Procedure: MICRODISCECTOMY LUMBAR LAMINECTOMY;  Surgeon: Jessy Oto, MD;  Location: [place];  Service: Orthopedics;  Laterality: N/A;  Central laminectomy L2-3, L3-4, L4-5   PACEMAKER INSERTION  10/08/2014   MDT Advisa MRI compatible dual chamber pacemaker implanted by Dr Caryl Comes for syncope with post-termination pauses   PERCUTANEOUS CORONARY STENT INTERVENTION (PCI-S)  11/09/2014   des to lad & distal circumflex         Dr  [name]   PERIPHERAL VASCULAR BALLOON ANGIOPLASTY Right 11/11/2019   Procedure: PERIPHERAL VASCULAR BALLOON ANGIOPLASTY;  Surgeon: Marty Heck, MD;  Location: [place] [facility];  Service: Cardiovascular;  Laterality: Right;  below knee popliteal, tibioperoneal trunk, posterior tibial arteries   PERIPHERAL VASCULAR CATHETERIZATION N/A 11/08/2015   Procedure: Abdominal Aortogram;  Surgeon: Serafina Mitchell, MD;  Location: [place] [facility];  Service: Cardiovascular;  Laterality: N/A;   PERIPHERAL VASCULAR CATHETERIZATION Right 11/08/2015   Procedure: Peripheral Vascular Atherectomy;  Surgeon: Serafina Mitchell,  MD;  Location: [place] [facility];  Service: Cardiovascular;  Laterality: Right;   PERIPHERAL VASCULAR CATHETERIZATION N/A 10/10/2016   Procedure: Lower Extremity Angiography;  Surgeon: Waynetta Sandy, MD;  Location: [place] [facility];  Service: Cardiovascular;  Laterality: N/A;   PERIPHERAL VASCULAR CATHETERIZATION Right 10/10/2016   Procedure: Peripheral Vascular Atherectomy;  Surgeon: Waynetta Sandy, MD;  Location: [place] [facility];  Service: Cardiovascular;  Laterality: Right;  Popliteal   PERMANENT PACEMAKER INSERTION N/A 10/08/2014   Procedure: PERMANENT PACEMAKER INSERTION;  Surgeon: Deboraha Sprang, MD;  Location: [facility];  Service: Cardiovascular;  Laterality: N/A;    SUBMUCOSAL TATTOO INJECTION  07/20/2020   Procedure: SUBMUCOSAL TATTOO INJECTION;  Surgeon: Irving Copas., MD;  Location: [place];  Service: Gastroenterology;;   TEE WITHOUT CARDIOVERSION N/A 10/27/2021   Procedure: TRANSESOPHAGEAL ECHOCARDIOGRAM (TEE);  Surgeon: Adrian Prows, MD;  Location: [facility] ENDOSCOPY;  Service: Cardiovascular;  Laterality: N/A;   TEMPORARY PACEMAKER INSERTION Bilateral 10/07/2014   Procedure: TEMPORARY PACEMAKER INSERTION;  Surgeon: Laverda Page, MD;  Location: [facility] CATH LAB;  Service: Cardiovascular;  Laterality: Bilateral;   WOUND DEBRIDEMENT Right 02/19/2020   Procedure: DEBRIDEMENT WOUND RIGHT FOOT;  Surgeon: Angelia Mould, MD;  Location: [facility] OR;  Service: Vascular;  Laterality: Right;    Family History  Problem Relation Age of Onset   Diabetes type II Mother    Hypertension Mother    Diabetes Mother    Liver cancer Father    Diabetes type II Sister    Breast cancer Sister    Diabetes Sister    Hypertension Sister    Diabetes type II Brother    Kidney failure Brother    Diabetes Brother    Hypertension Brother    Diabetes type II Sister      Social History   Tobacco Use   Smoking status: Former    Packs/day: 1.50    Years: 30.00    Pack years: 45.00    Types: Cigarettes    Quit date: 10/07/2014    Years since quitting: 7.0   Smokeless tobacco: Never  Vaping Use   Vaping Use: Never used  Substance Use Topics   Alcohol use: Yes    Alcohol/week: 21.0 standard drinks    Types: 21 Glasses of wine per week    Comment: cut way back, none in the last month   Drug use: No    Prior to Admission medications   Medication Sig Start Date End Date Taking? Authorizing Provider  apixaban (ELIQUIS) 5 MG TABS tablet Take 1 tablet (5 mg total) by mouth 2 (two) times daily. Patient taking differently: Take 5 mg by mouth in the morning. 10/09/21 01/07/22 Yes Adrian Prows, MD  atorvastatin (LIPITOR) 20 MG tablet Take 1 tablet (20 mg total)  by mouth at bedtime. 10/01/21  Yes Aline August, MD  fluticasone (FLONASE) 50 MCG/ACT nasal spray Place 2 sprays into both nostrils daily.   Yes [provider]  furosemide (LASIX) 80 MG tablet Take 80 mg by mouth See admin instructions. Take 80 mg by mouth on (non-dialysis days) Mondays, Wednesdays, Fridays & Sundays in the morning as needed "to cause urination" 02/26/21  Yes [provider]  lidocaine-prilocaine (EMLA) cream Apply 1 application topically every dialysis (on Tues/Thurs/Sat).   Yes [provider]  metoprolol tartrate (LOPRESSOR) 25 MG tablet Take 1 tablet (25 mg total) by mouth 2 (two) times daily. 05/24/21 05/19/22 Yes Adrian Prows, MD  Multiple Vitamin (MULTIVITAMIN WITH  MINERALS) TABS tablet Take 1 tablet by mouth 2 (two) times a week.   Yes [provider]  NOVOLIN 70/30 (70-30) 100 UNIT/ML injection Inject 50-60 Units into the skin See admin instructions. Inject 50-60 units into the skin in the morning and evening   Yes [provider]  ACCU-CHEK FASTCLIX LANCETS MISC Check blood sugar TID & QHS 10/01/14   Advani, Vernon Prey, MD  blood glucose meter kit and supplies KIT Check blood sugar TID & QHS 02/10/15   Advani, Vernon Prey, MD  Blood Glucose Monitoring Suppl (ACCU-CHEK ADVANTAGE DIABETES) kit Use as instructed 10/01/14   Lorayne Marek, MD  glucose blood (ACCU-CHEK AVIVA PLUS) test strip Use as instructed 10/01/14   Advani, Vernon Prey, MD  glucose blood (CHOICE DM FORA G20 TEST STRIPS) test strip Check blood sugar TID & QHS 02/10/15   Advani, Vernon Prey, MD  glucose blood test strip Use as instructed 11/20/13   Reyne Dumas, MD  insulin NPH-regular Human (70-30) 100 UNIT/ML injection Inject 60 Units into the skin 2 (two) times daily with a meal. Patient not taking: Reported on 10/26/2021 10/01/21   Aline August, MD  methocarbamol (ROBAXIN) 500 MG tablet Take 500 mg by mouth 3 (three) times daily. Patient not taking: Reported on 10/26/2021 10/18/21    [provider]    Current Facility-Administered Medications  Medication Dose Route Frequency Provider Last Rate Last Admin   0.9 %  sodium chloride infusion   Intravenous Continuous Rai, Ripudeep K, MD 10 mL/hr at 10/27/21 1845 New Bag at 10/27/21 1845   acetaminophen (TYLENOL) tablet 650 mg  650 mg Oral Q4H PRN Adrian Prows, MD       apixaban Arne [place]) tablet 5 mg  5 mg Oral BID Adrian Prows, MD   5 mg at 10/30/21 1003   benzonatate (TESSALON) capsule 200 mg  200 mg Oral TID Rai, Ripudeep K, MD       calcitRIOL (ROCALTROL) capsule 0.5 mcg  0.5 mcg Oral Q T,Th,Sa-HD Adrian Prows, MD   0.5 mcg at 10/26/21 1840   ceFEPIme (MAXIPIME) 2 g in sodium chloride 0.9 % 100 mL IVPB  2 g Intravenous Q T,Th,Sa-HD Rai, Ripudeep K, MD       Darbepoetin Alfa (ARANESP) injection 200 mcg  200 mcg Intravenous Q Thu-HD Adrian Prows, MD   200 mcg at 10/26/21 1842   diltiazem (CARDIZEM CD) 24 hr capsule 120 mg  120 mg Oral Daily Adrian Prows, MD   120 mg at 10/30/21 1003   feeding supplement (NEPRO CARB STEADY) liquid 237 mL  237 mL Oral BID BM Adrian Prows, MD   237 mL at 10/29/21 0923   furosemide (LASIX) tablet 80 mg  80 mg Oral Once per day on Sun Mon Wed Fri Adrian Prows, MD   80 mg at 10/29/21 0926   guaiFENesin-dextromethorphan (ROBITUSSIN DM) 100-10 MG/5ML syrup 5 mL  5 mL Oral Q4H PRN Rai, Ripudeep K, MD   5 mL at 10/29/21 2114   insulin aspart (novoLOG) injection 0-6 Units  0-6 Units Subcutaneous TID WC Rai, Ripudeep K, MD   1 Units at 10/30/21 1253   lidocaine (LIDODERM) 5 % 1 patch  1 patch Transdermal Q24H Rai, Ripudeep K, MD   1 patch at 10/29/21 3149   MEDLINE mouth rinse  15 mL Mouth Rinse BID Adrian Prows, MD   15 mL at 10/30/21 1200   methocarbamol (ROBAXIN) tablet 500 mg  500 mg Oral Q8H PRN Rai, Ripudeep K, MD   500 mg at  10/30/21 0545   metoprolol tartrate (LOPRESSOR) tablet 50 mg  50 mg Oral BID Adrian Prows, MD   50 mg at 10/30/21 1002   multivitamin (RENA-VIT) tablet 1 tablet  1 tablet Oral QHS Adrian Prows, MD   1 tablet at 10/29/21 2107   oxyCODONE-acetaminophen (PERCOCET/ROXICET) 5-325 MG per tablet 1-2 tablet  1-2 tablet Oral Q4H PRN Adrian Prows, MD   2 tablet at 10/30/21 0545   vancomycin (VANCOCIN) IVPB 1000 mg/200 mL premix  1,000 mg Intravenous Q T,Th,Sa-HD Rai, Ripudeep K, MD       vancomycin variable dose per unstable renal function (pharmacist dosing)   Does not apply See admin instructions Lynnda Child, PA-C        Allergies as of 10/25/2021 - Review Complete 10/25/2021  Allergen Reaction Noted   Cytoxan [cyclophosphamide]  07/23/2020   Keflex [cephalexin] Nausea And Vomiting 12/25/2019   Pletal [cilostazol] Palpitations 09/18/2017    Review of Systems:    Constitutional: No weight loss, fever, chills, weakness or fatigue HEENT: Eyes: No change in vision               Ears, Nose, Throat:  No change in hearing or congestion Skin: No rash or itching Cardiovascular: No chest pain, chest pressure or palpitations   Respiratory: No SOB or cough Gastrointestinal: See HPI and otherwise negative Genitourinary: No dysuria or change in urinary frequency Neurological: No headache, dizziness or syncope Musculoskeletal: No new muscle or joint pain Hematologic: No bleeding or bruising Psychiatric: No history of depression or anxiety     Physical Exam:  Vital signs in last 24 hours: Temp:  [97.4 F (36.3 C)-98.6 F (37 C)] 97.6 F (36.4 C) (12/19 1143) Pulse Rate:  [93-96] 93 (12/19 1143) Resp:  [14-30] 18 (12/19 1143) BP: (88-195)/(53-119) 115/70 (12/19 1143) SpO2:  [90 %-98 %] 97 % (12/19 1143) Weight:  [107.9 kg-110.2 kg] 107.9 kg (12/19 0630) Last BM Date: 10/25/21  General:  Jaundiced white  male in no acute distress, will appearing Head:  Normocephalic and atraumatic.Poor dentition.  Eyes: scleral icterus,conjunctive pale  Heart:  distant heart sounds Irregular rhythm, no murmurs Pulm: Clear anteriorly; left lower lobe wheezing Abdomen:  Soft, Obese AB, skin  exam normal, Normal bowel sounds.  no  tenderness  on exam . Without guarding and Without rebound, without hepatomegaly. No fluid wave, no dullness to percussion. Extremities:  right BKA, left forefoot amputation with 1-2+ edema, ecchymosis Neurologic:  Alert and  oriented x3, appears somnolent;  grossly normal neurologically. + left asterixis, has dupreyens contracture on right hand Skin: Jaundice  Dry and intact without significant lesions or rashes, has ecchymosis bilateral arms.  Psychiatric: Demonstrates good judgement and reason without abnormal affect or behaviors.  LAB RESULTS: Recent Labs    10/28/21 0140 10/29/21 0240  WBC 16.0* 22.0*  HGB 7.4* 8.0*  HCT 24.4* 27.4*  PLT 136* 153   BMET Recent Labs    10/28/21 0140 10/29/21 0852  NA 131* 130*  K 4.4 4.2  CL 94* 94*  CO2 26 25  GLUCOSE 164* 134*  BUN 44* 54*  CREATININE 4.23* 5.34*  CALCIUM 7.8* 7.8*   LFT Recent Labs    10/29/21 0852  PROT 5.5*  ALBUMIN 1.8*  AST 180*  ALT 174*  ALKPHOS 80  BILITOT 1.8*   PT/INR No results for input(s): LABPROT, INR in the last 72 hours.  STUDIES: US Abdomen Limited RUQ (LIVER/GB)  Result Date: 10/29/2021 CLINICAL DATA:  Transaminitis EXAM: ULTRASOUND  ABDOMEN LIMITED RIGHT UPPER QUADRANT COMPARISON:  CT abdomen pelvis 09/17/2019 FINDINGS: Gallbladder: No gallstones or wall thickening visualized. Moderate amount of sludge. No sonographic Murphy sign noted by sonographer. Common bile duct: Diameter: 1.1 cm, dilated. Liver: No focal lesion identified. Within normal limits in parenchymal echogenicity. Portal vein is patent on color Doppler imaging with normal direction of blood flow towards the liver. Other: None. IMPRESSION: 1. Nonspecific dilation of the common bile duct measuring 1.1 cm. If there is concern for biliary duct obstruction consider MRI/MRCP. 2.  Moderate amount of sludge in the gallbladder. 3.  Normal sonographic appearance of the liver. Electronically Signed    By: Audie Pinto M.D.   On: 10/29/2021 12:35      Impression    Elevated liver function, with elevation of alk phos, total bili slightly elevated and patient with recent daily endocarditis, pneumonia, multiple antibiotics, possible pulmonary infarcts.Marland Kitchen Ultrasound with nonspecific dilatation common bile duct 1.1.  Moderate amount of sludge in gallbladder. WBC 22.0 (16) (10.8)  HGB 8.0 MCV 91.6 Platelets 153 AST 180 ALT 174 Alkphos 80 TBili 1.8 (Last normal AST/ALT 08/19/2020 with AST 22/ALT 21.  GFR 11  Lipitor held, multiple antibiotics need to be continued. Liver function not elevated enough to be emboli With heart disease possible cardiogenic cirrhosis  Normocytic Anemia HGB 8.0 MCV 91.6  07/2020 Iron 41 Ferritin 401 B12 516 ESRD Normal colon, normal enteroscopy.   Atrial fibrillation with RVR (HCC) On eliquis  ESRD (end stage renal disease) ([place]) On dialysis  Diabetes mellitus with end stage renal disease ([place])  CAD (coronary artery disease), native coronary artery Being follow by cardiology  PVD (peripheral vascular disease) ([place]) S/p amputation RBKA, left forefoot  Pacemaker: Medtronic Advisa DR MRI [id number] Dual chamber pacemaker 10/08/2014      Plan   -Unclear etiology significant history possible daily, cardiogenic, -Will send for work-up for other causes of LFTs including labs for autoimmune hepatitis, primary biliary cholangitis, ferritin, alpha-1 antitrypsin Hepatitis panel negative acute, has had reactive HepBsABS- will get Hep B core total -Will order PT/INR to ensure no coagulopathy. -Will continue to trend LFTs There is evidence of biliary obstruction based on ultrasound but labs are negative, no symptoms.  Will discuss with radiology if pacemaker is MRI compatible to get MRCP, if it is not can get pancreatic protocol CT.  Continue to hold lipitor.  Can add on lactulose for constipation BID as needed, may help with asterixis though ammonia is low.     Thank you for your kind consultation, we will continue to follow.  Vladimir Crofts  10/30/2021, 3:23 PM

## 2021-10-30 NOTE — Consult Note (Signed)
° °  [facility] CM Inpatient Consult   10/30/2021  Alexander [name] [birth year] [id number]  [place] [facility] [ACO] Patient: [name]  Primary Care Provider:  Osborne [name], Alexander Him, MD, [facility] is listed, this provider office is listed to do the [facility] follow up calls and appointments.   Patient screened for hospitalization with noted high risk score for unplanned readmission risk and  to assess for potential [place] Management service needs for post hospital transition to  encourage PCP followup.  Plan:  Continue to follow progress and disposition to assess for post hospital care management needs.    For questions contact:   [name], RN BSN [facility] Liaison  [phone] business mobile phone Toll free office [phone]  Fax number: [phone] [email or web address]

## 2021-10-31 DIAGNOSIS — T827XXA Infection and inflammatory reaction due to other cardiac and vascular devices, implants and grafts, initial encounter: Secondary | ICD-10-CM

## 2021-10-31 DIAGNOSIS — T827XXD Infection and inflammatory reaction due to other cardiac and vascular devices, implants and grafts, subsequent encounter: Secondary | ICD-10-CM

## 2021-10-31 LAB — BPAM RBC
Blood Product Expiration Date: 202212262359
ISSUE DATE / TIME: 202212191852
Unit Type and Rh: 6200

## 2021-10-31 LAB — COMPREHENSIVE METABOLIC PANEL
ALT: 87 U/L — ABNORMAL HIGH (ref 0–44)
AST: 42 U/L — ABNORMAL HIGH (ref 15–41)
Albumin: 1.6 g/dL — ABNORMAL LOW (ref 3.5–5.0)
Alkaline Phosphatase: 65 U/L (ref 38–126)
Anion gap: 10 (ref 5–15)
BUN: 41 mg/dL — ABNORMAL HIGH (ref 8–23)
CO2: 25 mmol/L (ref 22–32)
Calcium: 7.6 mg/dL — ABNORMAL LOW (ref 8.9–10.3)
Chloride: 96 mmol/L — ABNORMAL LOW (ref 98–111)
Creatinine, Ser: 4.15 mg/dL — ABNORMAL HIGH (ref 0.61–1.24)
GFR, Estimated: 15 mL/min — ABNORMAL LOW (ref >60)
Glucose, Bld: 283 mg/dL — ABNORMAL HIGH (ref 70–99)
Potassium: 4.1 mmol/L (ref 3.5–5.1)
Sodium: 131 mmol/L — ABNORMAL LOW (ref 135–145)
Total Bilirubin: 1.9 mg/dL — ABNORMAL HIGH (ref 0.3–1.2)
Total Protein: 5.3 g/dL — ABNORMAL LOW (ref 6.5–8.1)

## 2021-10-31 LAB — CBC
HCT: 25 % — ABNORMAL LOW (ref 39.0–52.0)
Hemoglobin: 7.8 g/dL — ABNORMAL LOW (ref 13.0–17.0)
MCH: 27.5 pg (ref 26.0–34.0)
MCHC: 31.2 g/dL (ref 30.0–36.0)
MCV: 88 fL (ref 80.0–100.0)
Platelets: 107 10*3/uL — ABNORMAL LOW (ref 150–400)
RBC: 2.84 MIL/uL — ABNORMAL LOW (ref 4.22–5.81)
RDW: 20.9 % — ABNORMAL HIGH (ref 11.5–15.5)
WBC: 13.9 10*3/uL — ABNORMAL HIGH (ref 4.0–10.5)
nRBC: 0.5 % — ABNORMAL HIGH (ref 0.0–0.2)

## 2021-10-31 LAB — TYPE AND SCREEN
ABO/RH(D): A POS
Antibody Screen: NEGATIVE
Unit division: 0

## 2021-10-31 LAB — GLUCOSE, CAPILLARY
Glucose-Capillary: 274 mg/dL — ABNORMAL HIGH (ref 70–99)
Glucose-Capillary: 284 mg/dL — ABNORMAL HIGH (ref 70–99)
Glucose-Capillary: 246 mg/dL — ABNORMAL HIGH (ref 70–99)

## 2021-10-31 MED ORDER — INSULIN GLARGINE-YFGN 100 UNIT/ML ~~LOC~~ SOLN
18.0000 [IU] | Freq: Every day | SUBCUTANEOUS | Status: DC
  Administered 2021-11-01 – 2021-11-06 (×6): 18 [IU] via SUBCUTANEOUS
  Filled 2021-10-31 (×7): qty 0.18

## 2021-10-31 NOTE — Progress Notes (Addendum)
[facility] Progress Note   Subjective:    Patient seen and examined at bedside. S/p 1 unit PRBCs for Hgb 6.7 yesterday afternoon. Hgb now 7.8. Still reports occasional cough with dyspnea but appears comfortable. Denies CP, N/V. Plan for HD today.  Objective Vitals:   10/30/21 2220 10/30/21 2322 10/31/21 0408 10/31/21 0849  BP: 113/62 118/61 104/63 120/69  Pulse: 97 86 78   Resp: (!) 21 17 16    Temp: 98.6 F (37 C) 98.2 F (36.8 C) 98.6 F (37 C)   TempSrc: Oral Oral Oral   SpO2: 99% 98% 99%   Weight:   107.8 kg   Height:       Physical Exam General: Well-appearing; NAD Heart: Irregular; No murmurs, gallops, or rubs Lungs: Clear in uppers and laterally; No wheezing, rales, or rhonchi Abdomen: Soft, non-tender, active bowel sounds Extremities: Noted trace edema BL thighs; 1+ edema LLE; mild myoclonic jerking noted BLUE Dialysis Access: R AVF (+) Bruit/Thrill   Filed Weights   10/30/21 0456 10/30/21 0630 10/31/21 0408  Weight: 108 kg 107.9 kg 107.8 kg    Intake/Output Summary (Last 24 hours) at 10/31/2021 0900 Last data filed at 10/31/2021 0300 Gross per 24 hour  Intake 410 ml  Output 475 ml  Net -65 ml    Additional Objective Labs: Basic Metabolic Panel: Recent Labs  Lab 10/26/21 1055 10/28/21 0140 10/29/21 0852 10/30/21 1552 10/31/21 0427  NA 130*   < > 130* 133* 131*  K 3.6   < > 4.2 4.2 4.1  CL 92*   < > 94* 96* 96*  CO2 27   < > 25 27 25   GLUCOSE 148*   < > 134* 221* 283*  BUN 36*   < > 54* 31* 41*  CREATININE 3.65*   < > 5.34* 3.58* 4.15*  CALCIUM 7.3*   < > 7.8* 7.9* 7.6*  PHOS 4.3  --   --   --   --    < > = values in this interval not displayed.   Liver Function Tests: Recent Labs  Lab 10/29/21 0852 10/30/21 1552 10/31/21 0427  AST 180* 71* 42*  ALT 174* 115* 87*  ALKPHOS 80 74 65  BILITOT 1.8* 1.9* 1.9*  PROT 5.5* 5.4* 5.3*  ALBUMIN 1.8* 1.7* 1.6*   No results for input(s): LIPASE, AMYLASE in the last 168  hours. CBC: Recent Labs  Lab 10/25/21 1828 10/26/21 1055 10/28/21 0140 10/29/21 0240 10/30/21 1552 10/31/21 0427  WBC 13.0* 10.8* 16.0* 22.0* 14.3* 13.9*  NEUTROABS 11.4*  --   --   --   --   --   HGB 8.9* 7.1* 7.4* 8.0* 6.7* 7.8*  HCT 29.2* 23.3* 24.4* 27.4* 22.1* 25.0*  MCV 88.8 87.9 88.4 91.6 89.1 88.0  PLT 97* 91* 136* 153 143* 107*   Blood Culture    Component Value Date/Time   SDES BLOOD LEFT ARM 10/27/2021 1036   SPECREQUEST  10/27/2021 1036    BOTTLES DRAWN AEROBIC AND ANAEROBIC Blood Culture adequate volume   CULT  10/27/2021 1036    NO GROWTH 4 DAYS Performed at [facility], [place] [address], [place], [place] [address]    REPTSTATUS PENDING 10/27/2021 1036    Cardiac Enzymes: No results for input(s): CKTOTAL, CKMB, CKMBINDEX, TROPONINI in the last 168 hours. CBG: Recent Labs  Lab 10/30/21 0751 10/30/21 1140 10/30/21 1632 10/30/21 2131 10/31/21 0743  GLUCAP 165* 177* 269* 270* 274*   Iron Studies:  Recent Labs  10/30/21 1629  FERRITIN 2,418*   Lab Results  Component Value Date   INR 3.0 (H) 10/30/2021   INR 1.2 07/22/2020   INR 1.2 05/26/2020   Studies/Results: US Abdomen Limited RUQ (LIVER/GB)  Result Date: 10/29/2021 CLINICAL DATA:  Transaminitis EXAM: ULTRASOUND ABDOMEN LIMITED RIGHT UPPER QUADRANT COMPARISON:  CT abdomen pelvis 09/17/2019 FINDINGS: Gallbladder: No gallstones or wall thickening visualized. Moderate amount of sludge. No sonographic Murphy sign noted by sonographer. Common bile duct: Diameter: 1.1 cm, dilated. Liver: No focal lesion identified. Within normal limits in parenchymal echogenicity. Portal vein is patent on color Doppler imaging with normal direction of blood flow towards the liver. Other: None. IMPRESSION: 1. Nonspecific dilation of the common bile duct measuring 1.1 cm. If there is concern for biliary duct obstruction consider MRI/MRCP. 2.  Moderate amount of sludge in the gallbladder. 3.  Normal sonographic  appearance of the liver. Electronically Signed   By: Audie Pinto M.D.   On: 10/29/2021 12:35    Medications:  sodium chloride 10 mL/hr at 10/27/21 1845   ceFEPime (MAXIPIME) IV     vancomycin      sodium chloride   Intravenous Once   apixaban  5 mg Oral BID   benzonatate  200 mg Oral TID   calcitRIOL  0.5 mcg Oral Q T,Th,Sa-HD   darbepoetin (ARANESP) injection - DIALYSIS  200 mcg Intravenous Q Thu-HD   diltiazem  120 mg Oral Daily   feeding supplement (NEPRO CARB STEADY)  237 mL Oral BID BM   furosemide  80 mg Oral Once per day on Sun Mon Wed Fri   insulin aspart  0-6 Units Subcutaneous TID WC   lidocaine  1 patch Transdermal Q24H   mouth rinse  15 mL Mouth Rinse BID   metoprolol tartrate  50 mg Oral BID   multivitamin  1 tablet Oral QHS   vancomycin variable dose per unstable renal function (pharmacist dosing)   Does not apply See admin instructions    Dialysis Orders: GO TTS  4h 73min  108.5kg   400/800   2/2.5 bath  TDC   AVF RUA Hep none  - mircera 225 last 12/1 - calcitriol 0.5 ug tiw  Assessment/Plan: PNA - w/ cough, mild SOB, RLL infiltrate on CXR.  On antibiotics.  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 Spoke with Pharmacy-plan for patient to receive Vancomycin 1g and Cefepime 2g with HD (TTS) until 12/08/21-coordinated plan with patient's home HD center today. Repeat blood cultures (-) to date.  Cardiology following  ESRD - on HD TTS. Difficulty cannulating AVF previously. Re-attempt dialysis yesterday-tolerated yesterday's HD with net UF 2L. Plan for HD today. Please note: Informed by HD staff of extreme difficulty cannulating patient. After several attempts, was successful with and HD was started-will monitor throughout treatment. Consulted VVS and spoke with Arlee Muslim PA. Plan for Fistulogram with possible intervention on 11/03/21-will schedule for HD 2nd shift. Myoclonic Jerking-  Ongoing; Discussed with Dr. Doreatha Massed has been ongoing for few months prior to admission. Doesn't appear to be d/t uremia. Currently not on Gabapentin. LFTs elevated. Current Ammonia level WNL. He will need outpatient neurologic workup. Volume 1+ edema bilateral thighs and LLE-probably losing body wt (hx poor appetite for 1-2 mos). Will have lower dry weight at discharge.  HTN - BP trend soft but stable currently. Takes only metropolol, which may be for afib Afib/ RVR - Rate controlled, on BB, Cardizem,  Eliquis per cardiology  Echo 12/15 -- EF 45%,  Large tricuspid valve veg , severe TVR - not a good candidate for surgery per Dr. [name]  DM2 - on insulin Anemia ckd - S/p 1 unit PRBCs for Hgb 6.7 yesterday afternoon. Hgb now 7.8. Next dose due 12/22. Transfuse prn.  MBD ckd - Corr Ca/Phos ok.  cont vdra Vascular access - having issues with cannulation of his AVF will plan for fistulogram    Tobie Poet, NP [facility] 10/31/2021,9:00 AM  LOS: 6 days    I have seen and examined this patient and agree with plan and assessment in the above note with renal recommendations/intervention highlighted. Appreciate pharmacy's assistance.  Governor Rooks Eliza Grissinger,MD 10/31/2021 4:28 PM

## 2021-10-31 NOTE — Procedures (Signed)
Difficult cannulation, Pt also bleeds around insertion site of access of Fistula.  Ecchymosis noted all around his fistula.  Anderson NP notified and came to bedside to further assess fistula site.  Decision made to plan for fistulagram on Friday morning and dialysis after.  Will continue to assess fistula site closely throughout hemodialysis procedure.

## 2021-10-31 NOTE — Progress Notes (Signed)
Inpatient Diabetes Program Recommendations  AACE/ADA: New Consensus Statement on Inpatient Glycemic Control (2015)  Target Ranges:  Prepandial:   less than 140 mg/dL      Peak postprandial:   less than 180 mg/dL (1-2 hours)      Critically ill patients:  140 - 180 mg/dL   Lab Results  Component Value Date   GLUCAP 274 (H) 10/31/2021   HGBA1C 6.8 (H) 10/29/2021    Review of Glycemic Control  Latest Reference Range & Units 10/30/21 16:32 10/30/21 21:31 10/31/21 07:43  Glucose-Capillary 70 - 99 mg/dL 269 (H) 270 (H) 274 (H)  (H): Data is abnormally high Diabetes history: DM 2 Outpatient Diabetes medications: Novolin 70/30 10-70 units bid- dosed by patient Current orders for Inpatient glycemic control:  Novolog (0-6 units) tid with meals   Inpatient Diabetes Program Recommendations:     Consider adding Semglee 18 units QD to start now.   Thanks, Bronson Curb, MSN, RNC-OB Diabetes Coordinator [phone] (8a-5p)

## 2021-10-31 NOTE — Evaluation (Signed)
Physical Therapy Evaluation Patient Details Name: Alexander [name] MRN: [id number] DOB: [birth year] Today's Date: 10/31/2021  History of Present Illness  60 y.o. male presents to ED 12/14 with c/o increasing SoB and cough. Found to be in Afib with RVR, HR to 140s, hypoxic down in the 80%O2 on RA. Chest x-ray showing right basilar pneumonia with resolved left upper lobe pneumonia PMH: ESRD on HD TTS, paroxysmal atrial fibrillation on Eliquis, pacemaker, CAD s/p stent, diastolic CHF, peripheral vascular disease with right BKA, history of GI bleed, type 2 diabetes  Clinical Impression  PTA pt living with wife and daughter in single story home with ramped entrance. Pt reports mod independence with pivot to wheelchair with use of R LE prosthetic. Pt takes birdbaths and is independent in dressing. Daughter provides transportation to dialysis. Pt is currently limited in safe mobility, by decreased cognition, including decreased awareness of safety and difficulty with memory and sequencing, and variability with moods, in presence of generalized weakness. Pt requires modA for bringing trunk to upright and total A to return to bed. PT recommending SNF level rehab at discharge. Pt would be able to return home with 24 hour assist for decreased cognition and safety awareness and receive HHPT. PT will continue to follow acutely.      Recommendations for follow up therapy are one component of a multi-disciplinary discharge planning process, led by the attending physician.  Recommendations may be updated based on patient status, additional functional criteria and insurance authorization.  Follow Up Recommendations Skilled nursing-short term rehab (<3 hours/day)    Assistance Recommended at Discharge Frequent or constant Supervision/Assistance  Functional Status Assessment Patient has had a recent decline in their functional status and demonstrates the ability to make significant improvements in function in a reasonable  and predictable amount of time.  Equipment Recommendations  None recommended by PT (has necessary equipment)       Precautions / Restrictions Precautions Precautions: Fall Restrictions Weight Bearing Restrictions: No      Mobility  Bed Mobility Overal bed mobility: Needs Assistance Bed Mobility: Sit to Supine;Supine to Sit     Supine to sit: Mod assist Sit to supine: Total assist   General bed mobility comments: pt in sidlying on entry, able to manage LE off bed, requires modA for bringing trunk to upright, requires total A for returning to bed required for physical assist when pt HR exhibited several bouts of nonsustained VTach and non compliant with request to return to supine.    Transfers                   General transfer comment: attempted however pt unable to sequence to perform pivot transfer to recliner on L           Balance Overall balance assessment: Needs assistance Sitting-balance support: Feet unsupported;No upper extremity supported;Bilateral upper extremity supported;Single extremity supported Sitting balance-Leahy Scale: Poor Sitting balance - Comments: requires at least single UE assist to maintain upright                                     Pertinent Vitals/Pain Pain Assessment: Faces Faces Pain Scale: Hurts even more Pain Location: back with movement Pain Descriptors / Indicators: Grimacing;Guarding;Moaning Pain Intervention(s): Limited activity within patient's tolerance;Monitored during session;Repositioned    Home Living Family/patient expects to be discharged to:: Private residence Living Arrangements: Children;Spouse/significant other Available Help at Discharge: Family Type of Home:  House Home Access: Ramped entrance       Home Layout: One level Home Equipment: Wheelchair - IT trainer (2 wheels)      Prior Function Prior Level of Function : Needs assist             Mobility  Comments: uses wheelchair, modI transfers. daughter provides transportation to dialysis ADLs Comments: takes bird baths, independent dressing     Hand Dominance   Dominant Hand: Right    Extremity/Trunk Assessment   Upper Extremity Assessment Upper Extremity Assessment: Defer to OT evaluation    Lower Extremity Assessment Lower Extremity Assessment: Generalized weakness;RLE deficits/detail;LLE deficits/detail RLE Deficits / Details: R BKA amputation, difficult to assess due to decreased command follow LLE Deficits / Details: L transmet amputation, difficult to assess due to lack of command follow    Cervical / Trunk Assessment Cervical / Trunk Assessment: Kyphotic  Communication   Communication: No difficulties  Cognition Arousal/Alertness: Awake/alert Behavior During Therapy: Flat affect;Impulsive;Agitated Overall Cognitive Status: No family/caregiver present to determine baseline cognitive functioning Area of Impairment: Memory;Following commands;Safety/judgement;Awareness;Problem solving;Attention                   Current Attention Level: Selective Memory: Decreased short-term memory Following Commands: Follows one step commands inconsistently;Follows multi-step commands inconsistently;Follows one step commands with increased time Safety/Judgement: Decreased awareness of safety Awareness: Intellectual Problem Solving: Slow processing;Decreased initiation;Difficulty sequencing;Requires verbal cues;Requires tactile cues General Comments: unable to count backwards from 20 or name months of year in reverse, decreased awareness of deficits        General Comments General comments (skin integrity, edema, etc.): HR at rest 76bpm, with mobility HR variable 70-120s, with numerous bouts of nonsustained VTach. SpO2 on RA 97%O2, BP with return to supine. 126/72        Assessment/Plan    PT Assessment Patient needs continued PT services  PT Problem List Decreased range  of motion;Decreased strength;Decreased activity tolerance;Decreased balance;Decreased mobility;Decreased safety awareness;Cardiopulmonary status limiting activity;Pain       PT Treatment Interventions DME instruction;Gait training;Functional mobility training;Therapeutic activities;Therapeutic exercise;Balance training;Cognitive remediation;Patient/family education    PT Goals (Current goals can be found in the Care Plan section)  Acute Rehab PT Goals Patient Stated Goal: none stated PT Goal Formulation: With patient Time For Goal Achievement: 11/14/21 Potential to Achieve Goals: Fair    Frequency Min 2X/week    AM-PAC PT "6 Clicks" Mobility  Outcome Measure Help needed turning from your back to your side while in a flat bed without using bedrails?: None Help needed moving from lying on your back to sitting on the side of a flat bed without using bedrails?: A Lot Help needed moving to and from a bed to a chair (including a wheelchair)?: Total Help needed standing up from a chair using your arms (e.g., wheelchair or bedside chair)?: Total Help needed to walk in hospital room?: Total Help needed climbing 3-5 steps with a railing? : Total 6 Click Score: 10    End of Session Equipment Utilized During Treatment: Gait belt Activity Tolerance: Treatment limited secondary to medical complications (Comment) (Heart rate) Patient left: in bed;with call bell/phone within reach;with bed alarm set Nurse Communication: Mobility status;Other (comment) (variable HR) PT Visit Diagnosis: Other abnormalities of gait and mobility (R26.89);Muscle weakness (generalized) (M62.81);Difficulty in walking, not elsewhere classified (R26.2);Unsteadiness on feet (R26.81)    Time: 4854-6270 PT Time Calculation (min) (ACUTE ONLY): 34 min   Charges:   PT Evaluation $PT Eval Moderate Complexity: 1 Mod  Rodriques Badie B. Migdalia Dk PT, DPT Acute Rehabilitation Services Pager [phone] Office [phone]   [place] 10/31/2021, 12:48 PM

## 2021-10-31 NOTE — Progress Notes (Signed)
[facility]  Date of Admission:  10/25/2021      Lines: Right UE chronic AVF pacemaker  Abx: 12/16-c cefepime 12/16-c vanc  12/14-16 ceftriaxone 12/14-16 doxycycline  ASSESSMENT: 60 yo male with CAD, s/p pacemaker, esrd on iHD via rue avf, afib, admitted initially for presumed pna, found to have TEE evidence of TV/pacemaker vegetations and subacute lower back pain with ct finding L2,3,5 end plate fx  Initial bcx after 2 days empiric abx, so far negative From description of the TV appearance, appears syndrome c/w endocarditis/lead infection both High risk surgical removal of pacer. Agree reasonable to try abx therapy. If worsening sepsis or new bacteremia despite what appears to be appropriate abx therapy, could revisit this topic  Lower back pain with ct showing subacute lumbar end plate fracture without abscess... ct done without contrast. Presuming infection involved tx already should cover  Lft elevated but RUQ u/s no evidence stone; no clinical syndrome cholecystitis/cholangitis or liver abscess  Exam with very poor dentition    12/20 assessment Labs significantly improving Subjectively not yet feeling well but not worseLft elevation at admission likely from sepsis; trending down.   PLAN: Finish 6 weeks vanc/cefepime tiw given after dialysis runs -- until 12/08/2021 Vanc/cefepime protocol per dialysis center On 12/08/2021 will need to transition to oral abx (will determine in [facility] follow up) Advise patient to see his dentist for his poor dentition/risk for further endovascular infection Discussed with primary care/signed off    OPAT Orders Discharge antibiotics to be administered in dialysis center as above mentioned  Labs weekly while on IV antibiotics (please have dialysis center draw these labs) _x_ CBC with differential __ BMP _x_ CMP _x_ CRP __ ESR __ Vancomycin trough __ CK   Fax weekly labs to [phone]  Clinic Follow Up Appt: 11/23/21 @ 930 with Dr Gale Journey  @  [facility] [address], [place], [place] [address] Phone: [phone]   Principal Problem:   Tricuspid valve vegetation Active Problems:   Diabetes mellitus with end stage renal disease ([place])   PVD (peripheral vascular disease) ([place])   CAD (coronary artery disease), native coronary artery   Pacemaker: Medtronic Advisa DR MRI [id number] Dual chamber pacemaker 10/08/2014   ESRD (end stage renal disease) ([place])   Atrial fibrillation with RVR (H[facility])   Thrombocytopenia (H[facility])   Hyponatremia   Acute on chronic diastolic CHF (congestive heart failure) (HCC)   Transaminitis   Allergies  Allergen Reactions   Cytoxan [Cyclophosphamide] Other (See Comments)    Pancytopenia   Keflex [Cephalexin] Nausea And Vomiting   Tylenol [Acetaminophen] Other (See Comments)    Makes the patient sweat   Pletal [Cilostazol] Palpitations and Other (See Comments)    "Can hear heart beating loudly".    Scheduled Meds:  sodium chloride   Intravenous Once   apixaban  5 mg Oral BID   benzonatate  200 mg Oral TID   calcitRIOL  0.5 mcg Oral Q T,Th,Sa-HD   darbepoetin (ARANESP) injection - DIALYSIS  200 mcg Intravenous Q Thu-HD   diltiazem  120 mg Oral Daily   feeding supplement (NEPRO CARB STEADY)  237 mL Oral BID BM   furosemide  80 mg Oral Once per day on Sun Mon Wed Fri   insulin aspart  0-6 Units Subcutaneous TID WC   insulin glargine-yfgn  18 Units Subcutaneous Daily   lidocaine  1 patch Transdermal Q24H   mouth rinse  15 mL Mouth Rinse BID   metoprolol tartrate  50 mg Oral BID   multivitamin  1 tablet Oral QHS   vancomycin variable dose per unstable renal function (pharmacist dosing)   Does not apply See admin instructions   Continuous Infusions:  sodium chloride 10 mL/hr at 10/27/21 1845   ceFEPime (MAXIPIME) IV     vancomycin     PRN Meds:.acetaminophen, guaiFENesin-dextromethorphan, lactulose, methocarbamol,  oxyCODONE-acetaminophen   SUBJECTIVE: Stable No f/c No new focal pain No dyspnea/cough   Review of Systems: ROS All other ROS was negative, except mentioned above     OBJECTIVE: Vitals:   10/30/21 2322 10/31/21 0408 10/31/21 0849 10/31/21 1147  BP: 118/61 104/63 120/69 (!) 112/59  Pulse: 86 78  95  Resp: _0 Temp: 98.2 F (36.8 C) 98.6 F (37 C)  98.6 F (37 C)  TempSrc: Oral Oral  Oral  SpO2: 98% 99%  99%  Weight:  107.8 kg    Height:       Body mass index is 32.23 kg/m.  Physical Exam General/constitutional: no distress, pleasant HEENT: poor dentition Neck supple CV: rrr no mrg Lungs: clear to auscultation, normal respiratory effort Abd: Soft, Nontender Ext: no edema Skin: No Rash Neuro: nonfocal MSK: no peripheral joint swelling/tenderness/warmth; old right bka/left tma     Lab Results Lab Results  Component Value Date   WBC 13.9 (H) 10/31/2021   HGB 7.8 (L) 10/31/2021   HCT 25.0 (L) 10/31/2021   MCV 88.0 10/31/2021   PLT 107 (L) 10/31/2021    Lab Results  Component Value Date   CREATININE 4.15 (H) 10/31/2021   BUN 41 (H) 10/31/2021   NA 131 (L) 10/31/2021   K 4.1 10/31/2021   CL 96 (L) 10/31/2021   CO2 25 10/31/2021    Lab Results  Component Value Date   ALT 87 (H) 10/31/2021   AST 42 (H) 10/31/2021   ALKPHOS 65 10/31/2021   BILITOT 1.9 (H) 10/31/2021      Microbiology: Recent Results (from the past 240 hour(s))  Resp Panel by RT-PCR (Flu A&B, Covid) Nasopharyngeal Swab     Status: None   Collection Time: 10/25/21  6:32 PM   Specimen: Nasopharyngeal Swab; Nasopharyngeal(NP) swabs in vial transport medium  Result Value Ref Range Status   SARS Coronavirus 2 by RT PCR NEGATIVE NEGATIVE Final    Comment: (NOTE) SARS-CoV-2 target nucleic acids are NOT DETECTED.  The SARS-CoV-2 RNA is generally detectable in upper respiratory specimens during the acute phase of infection. The lowest concentration of SARS-CoV-2 viral copies  this assay can detect is 138 copies/mL. A negative result does not preclude SARS-Cov-2 infection and should not be used as the sole basis for treatment or other patient management decisions. A negative result may occur with  improper specimen collection/handling, submission of specimen other than nasopharyngeal swab, presence of viral mutation(s) within the areas targeted by this assay, and inadequate number of viral copies(<138 copies/mL). A negative result must be combined with clinical observations, patient history, and epidemiological information. The expected result is Negative.  Fact Sheet for Patients:  EntrepreneurPulse.com.au  Fact Sheet for Healthcare Providers:  IncredibleEmployment.be  This test is no t yet approved or cleared by the [place] FDA and  has been authorized for detection and/or diagnosis of SARS-CoV-2 by FDA under an Emergency Use Authorization (EUA). This EUA will remain  in effect (meaning this test can be used) for the duration of the COVID-19 declaration under Section  564(b)(1) of the Act, 21 U.S.C.section 360bbb-3(b)(1), unless the authorization is terminated  or revoked sooner.       Influenza A by PCR NEGATIVE NEGATIVE Final   Influenza B by PCR NEGATIVE NEGATIVE Final    Comment: (NOTE) The Xpert Xpress SARS-CoV-2/FLU/RSV plus assay is intended as an aid in the diagnosis of influenza from Nasopharyngeal swab specimens and should not be used as a sole basis for treatment. Nasal washings and aspirates are unacceptable for Xpert Xpress SARS-CoV-2/FLU/RSV testing.  Fact Sheet for Patients: EntrepreneurPulse.com.au  Fact Sheet for Healthcare Providers: IncredibleEmployment.be  This test is not yet approved or cleared by the [place] FDA and has been authorized for detection and/or diagnosis of SARS-CoV-2 by FDA under an Emergency Use Authorization (EUA). This EUA  will remain in effect (meaning this test can be used) for the duration of the COVID-19 declaration under Section 564(b)(1) of the Act, 21 U.S.C. section 360bbb-3(b)(1), unless the authorization is terminated or revoked.  Performed at [facility], [place] [address] [place], [place] [address]   Culture, blood (Routine X 2) w Reflex to ID Panel     Status: None (Preliminary result)   Collection Time: 10/27/21 10:19 AM   Specimen: Left Antecubital; Blood  Result Value Ref Range Status   Specimen Description LEFT ANTECUBITAL  Final   Special Requests   Final    BOTTLES DRAWN AEROBIC AND ANAEROBIC Blood Culture adequate volume   Culture   Final    NO GROWTH 4 DAYS Performed at [facility], [place] [address], [place], [place] [address]    Report Status PENDING  Incomplete  Culture, blood (Routine X 2) w Reflex to ID Panel     Status: None (Preliminary result)   Collection Time: 10/27/21 10:36 AM   Specimen: BLOOD LEFT ARM  Result Value Ref Range Status   Specimen Description BLOOD LEFT ARM  Final   Special Requests   Final    BOTTLES DRAWN AEROBIC AND ANAEROBIC Blood Culture adequate volume   Culture   Final    NO GROWTH 4 DAYS Performed at [facility], [place] [address], [place],  [address]    Report Status PENDING  Incomplete     Serology:   Imaging: If present, new imagings (plain films, ct scans, and mri) have been personally visualized and interpreted; radiology reports have been reviewed. Decision making incorporated into the Impression / Recommendations.  12/16 tee 1. Left ventricular ejection fraction, by estimation, is 40 to 45%. The  left ventricle has mildly decreased function. The left ventricle  demonstrates global hypokinesis. There is moderate concentric left  ventricular hypertrophy.   2. Right ventricular systolic function is normal. The right ventricular  size is normal.   3. Left atrial size was moderately dilated. No left atrial/left  atrial  appendage thrombus was detected. The LAA emptying velocity was 30 cm/s.   4. Right atrial size was mildly dilated. There is a 1x1cm mass attached  to the RA pacemaker lead suggestive of thrombus or a vegetation.   5. The mitral valve is normal in structure. Mild to moderate mitral valve  regurgitation. No evidence of mitral stenosis.   6. There is a 2cmx1cm mass attached to the RV lead of the pacemaker  suggestive of a vegetation or a thrombus. The Anterior and septal TV  leaflets are thickened and suggests involvement with the vegetation. The  septal leaflet is flail. The presence of  flail leaflet suggests vegetation. Marland Kitchen  The tricuspid valve is degenerative.  Tricuspid valve regurgitation is severe.   7. The aortic valve is normal in structure. Aortic valve regurgitation is  not visualized. No aortic stenosis is present.   8. There is mild (Grade II) plaque involving the ascending aorta, aortic  arch and descending aorta.     12/16 chest ct without contrast There is a almost complete interval clearing of small infiltrate in the left upper lobe and clearing of small infiltrate in the right upper lobe.   There is interval appearance of large patchy alveolar infiltrates in the right lower lobe and to a lesser extent in the right middle lobe. Findings suggest large area of pneumonia.   There is interval appearance of small to moderate right pleural effusion part of which appears to be loculated in the periphery of right lower lung fields and within major fissure. Possibility of empyema is not excluded.   Other findings as described in the body of the report.    12/17 ct lumbar spine without contrast 1. Subacute appearing endplate fractures at L2, L3, and L5. These have occurred since November 2022. 2. Lumbar spine degeneration exacerbated by short pedicles. Advanced spinal stenosis at L2-3.    Jabier Mutton, [facility] [phone] pager    10/31/2021, 1:47 PM

## 2021-10-31 NOTE — Evaluation (Signed)
Occupational Therapy Evaluation Patient Details Name: Alexander [name] MRN: [id number] DOB: 20-Nov-1960 Today's Date: 10/31/2021   History of Present Illness 60 y.o. male presents to ED 12/14 with c/o increasing SoB and cough. Found to be in Afib with RVR, HR to 140s, hypoxic down in the 80%O2 on RA. Chest x-ray showing right basilar pneumonia with resolved left upper lobe pneumonia PMH: ESRD on HD TTS, paroxysmal atrial fibrillation on Eliquis, pacemaker, CAD s/p stent, diastolic CHF, peripheral vascular disease with right BKA, history of GI bleed, type 2 diabetes   Clinical Impression   PTA, pt was living with wife and daughter  in a single story home with a ramped entrance. Pt reports he was modified independent with transfers to/from w/c and independent with donning RLE prosthetic. Pt reports he was independent with sponge bathing and dressing. His daughter provides assistance with transportation. Pt received in bed, agreeable to session. Pt noted to have cognitive limitations and decreased awareness to deficits and decreased safety awareness. He required modA for bed mobility and minA for lateral scoot transfer toward the [facility]. During session, numerous bouts of non-sustained VT. Leads were intact and correctly placed. RN notified. Pt returned to supine with totalA+2 from therapists. Due to cognitive limitations, generalized deconditioning, BLE weakness, decreased activity tolerance and cardiac limitations recommend short term rehab following d/c. Pt will continue to benefit from skilled OT services to maximize safety and independence with ADL/IADL and functional mobility. Will continue to follow acutely and progress as tolerated.   HR at rest 76bpm, with mobility HR variable 70-120s, with numerous bouts of nonsustained VTach. SpO2 on RA 97%O2, BP with return to supine. 126/66mmHg     Recommendations for follow up therapy are one component of a multi-disciplinary discharge planning process, led by the  attending physician.  Recommendations may be updated based on patient status, additional functional criteria and insurance authorization.   Follow Up Recommendations  Skilled nursing-short term rehab (<3 hours/day)    Assistance Recommended at Discharge Frequent or constant Supervision/Assistance  Functional Status Assessment  Patient has had a recent decline in their functional status and demonstrates the ability to make significant improvements in function in a reasonable and predictable amount of time.  Equipment Recommendations  BSC/3in1    Recommendations for Other Services       Precautions / Restrictions Precautions Precautions: Fall Restrictions Weight Bearing Restrictions: No      Mobility Bed Mobility Overal bed mobility: Needs Assistance Bed Mobility: Sit to Supine;Supine to Sit     Supine to sit: Mod assist Sit to supine: Total assist   General bed mobility comments: pt in sidlying on entry, able to manage LE off bed, requires modA for bringing trunk to upright, requires total A for returning to bed required for physical assist when pt HR exhibited several bouts of nonsustained VTach and non compliant with request to return to supine.    Transfers                   General transfer comment: attempted however pt unable to sequence to perform pivot transfer to recliner on L      Balance Overall balance assessment: Needs assistance Sitting-balance support: Feet unsupported;No upper extremity supported;Bilateral upper extremity supported;Single extremity supported Sitting balance-Leahy Scale: Poor Sitting balance - Comments: requires at least single UE assist to maintain upright  ADL either performed or assessed with clinical judgement   ADL                                               Vision         Perception     Praxis      Pertinent Vitals/Pain Pain Assessment:  Faces Faces Pain Scale: Hurts even more Pain Location: back with movement Pain Descriptors / Indicators: Grimacing;Guarding;Moaning Pain Intervention(s): Limited activity within patient's tolerance;Monitored during session     Hand Dominance Right   Extremity/Trunk Assessment Upper Extremity Assessment Upper Extremity Assessment: Generalized weakness   Lower Extremity Assessment Lower Extremity Assessment: Defer to PT evaluation RLE Deficits / Details: R BKA amputation, difficult to assess due to decreased command follow LLE Deficits / Details: L transmet amputation, difficult to assess due to lack of command follow   Cervical / Trunk Assessment Cervical / Trunk Assessment: Kyphotic   Communication Communication Communication: No difficulties   Cognition Arousal/Alertness: Awake/alert Behavior During Therapy: Flat affect;Impulsive;Agitated Overall Cognitive Status: No family/caregiver present to determine baseline cognitive functioning Area of Impairment: Memory;Following commands;Safety/judgement;Awareness;Problem solving;Attention                   Current Attention Level: Selective Memory: Decreased short-term memory Following Commands: Follows one step commands inconsistently;Follows multi-step commands inconsistently;Follows one step commands with increased time Safety/Judgement: Decreased awareness of safety Awareness: Intellectual Problem Solving: Slow processing;Decreased initiation;Difficulty sequencing;Requires verbal cues;Requires tactile cues General Comments: unable to count backwards from 20 or name months of year in reverse, decreased awareness of deficits. pt required extended time for processing information, he reports he is not HOH. Pt demonstrated limitations with sequencing and problem solving.  Pt frequently repeating himself during session and answering previous questions as opposed to current question. Pt became agitated with cognitive questions but  was compliant after education on reason for assessing cognition.     General Comments  HR at rest 76bpm, with mobility HR variable 70-120s, with numerous bouts of nonsustained VTach. SpO2 on RA 97%O2, BP with return to supine. 126/72    Exercises     Shoulder Instructions      Home Living Family/patient expects to be discharged to:: Private residence Living Arrangements: Children;Spouse/significant other Available Help at Discharge: Family Type of Home: House Home Access: Ramped entrance     Home Layout: One level     Bathroom Shower/Tub: Sponge bathes at baseline   Bathroom Toilet: Standard Bathroom Accessibility: Yes   Home Equipment: Wheelchair - IT trainer (2 wheels)          Prior Functioning/Environment Prior Level of Function : Needs assist             Mobility Comments: uses wheelchair, modI transfers. daughter provides transportation to dialysis ADLs Comments: takes bird baths, independent dressing        OT Problem List: Decreased activity tolerance;Impaired balance (sitting and/or standing);Decreased cognition;Decreased safety awareness;Decreased knowledge of use of DME or AE;Cardiopulmonary status limiting activity      OT Treatment/Interventions: Self-care/ADL training;Therapeutic exercise;Energy conservation;DME and/or AE instruction;Therapeutic activities;Patient/family education;Balance training;Cognitive remediation/compensation    OT Goals(Current goals can be found in the care plan section) Acute Rehab OT Goals Patient Stated Goal: pt did not state OT Goal Formulation: With patient Time For Goal Achievement: 11/14/21 Potential to Achieve Goals: Good ADL Goals Pt Will Perform Lower Body  Dressing: with min guard assist;sitting/lateral leans Pt Will Transfer to Toilet: with min guard assist;with transfer board Additional ADL Goal #1: Pt will complete multistep cognitive task with <3 errors. Additional ADL Goal #2: Pt will  demonstrate anticipatory awareness during ADL/IADL and functional mobiltiy.  OT Frequency: Min 2X/week   Barriers to D/C:            Co-evaluation              AM-PAC OT "6 Clicks" Daily Activity     Outcome Measure Help from another person eating meals?: A Little Help from another person taking care of personal grooming?: A Little Help from another person toileting, which includes using toliet, bedpan, or urinal?: A Lot Help from another person bathing (including washing, rinsing, drying)?: A Lot Help from another person to put on and taking off regular upper body clothing?: A Little Help from another person to put on and taking off regular lower body clothing?: A Lot 6 Click Score: 15   End of Session Nurse Communication: Mobility status;Other (comment) (vitals and cognition)  Activity Tolerance: Patient tolerated treatment well;Treatment limited secondary to medical complications (Comment) (non-sustained v-tach) Patient left: in bed;with call bell/phone within reach;with bed alarm set  OT Visit Diagnosis: Other abnormalities of gait and mobility (R26.89);Muscle weakness (generalized) (M62.81);Other symptoms and signs involving cognitive function                Time: 1610-9604 OT Time Calculation (min): 31 min Charges:  OT General Charges $OT Visit: 1 Visit OT Evaluation $OT Eval Moderate Complexity: [place] OTR/L Acute Rehabilitation Services Office: [place] 10/31/2021, 1:21 PM

## 2021-10-31 NOTE — Progress Notes (Signed)
PHARMACY CONSULT NOTE FOR:  OUTPATIENT  PARENTERAL ANTIBIOTIC THERAPY (OPAT)  Indication: TV IE + pacemaker lead infection Regimen: Vancomycin 1g/HD-TTS + Cefepime 2g/HD-TTS End date: 12/08/21  No discharge orders pending since patient is ESRD and will plan to receive antibiotics with HD. Nephrology team to make note of LOT/stop date and plan for outpatient antibiotics accordingly.    Thank you for allowing pharmacy to be a part of this patients care.  Alycia Rossetti, PharmD, BCPS Clinical Pharmacist 10/31/2021 12:46 PM   **Pharmacist phone directory can now be found on [email or web address] ([email or web address]).  Listed under [place].

## 2021-10-31 NOTE — Progress Notes (Signed)
[facility]                                                                              Patient Demographics  Khaidyn Staebell, is a 60 y.o. male, DOB - 1960-11-18, FTD:[id number]  Admit date - 10/25/2021   Admitting Physician Orene Desanctis, DO  Outpatient Primary MD for the patient is Tisovec, [name], MD  Outpatient specialists:   LOS - 6  days   Medical records reviewed and are as summarized below:    Chief Complaint  Patient presents with   Shortness of Breath   Back Pain       Brief summary   Patient is a 60 year old male with ESRD on HD, TTS, paroxysmal A. fib on Eliquis, pacemaker, CAD status post stent, diastolic CHF, PVD with right BKA, prior history of GI bleed, diabetes mellitus type 2 presented with increasing shortness of breath and cough.  Per patient he was diagnosed with pneumonia in early November and completed course of antibiotics.  However he has not felt back to baseline since.  Has had persistent shortness of breath and coughing, fatigue.  Also noted some anterior and right lateral sided rib pain worse with coughing and worsening left lower extremity edema.  He did complete his HD last session prior to admission. In ED, patient was hypoxic down to 80%, was put on 2 L BNP 1000 181.  Chest x-ray showed right basilar pneumonia, he was placed on IV Rocephin and doxycycline.   Assessment & Plan    Principal Problem: Acute respiratory failure with hypoxia, multifactorial, acute on chronic diastolic CHF, community-acquired pneumonia, paroxysmal A. fib with RVR ([place]) -Patient was hypoxic in ED, sats 80%, placed on 2 L O2 -Chest x-ray showed clearing of prior LUL pneumonia, new hazy airspace disease and consolidation at the right base suspicion for right basilar pneumonia, moderate right pleural effusion, cardiomegaly - Cont antibiotics, tesselon, flutter valve  Active Problems: Tricuspid valve Infective endocarditis -2D echo showed large 1.2x  1.3 x 1 cm tricuspid valve vegetation close to pacemaker lead -TEE on 12/16 showed 1x 1 cm vegetation attached to right atrium pacemaker lead.  Large vegetation attached to TEE measuring 2x 1 cm, also appears to be attached to RV pacemaker lead, severe TR.  Per Dr. [name], pacemaker extraction and reimplant of new leads will be a major issue with ongoing infection and high risk of embolization hence recommended medical therapy for now -CT L-spine showed subacute appearing endplate fractures of C6-2 and L5 since November 2022, does not appear to have discitis or abscess on CT. -ID following, appreciate recommendations, continue vancomycin, cefepime  Acute on chronic diastolic CHF (congestive heart failure) ([place]) ESRD on hemodialysis -Suspect worsened due to pneumonia and A. fib with RVR, has ESRD on HD -Vol control with HD.  Patient having access issues with HD, vascular surgery was consulted -Plan for fistulogram and intervention by vascular surgery.  Transaminitis -Unclear etiology, discontinued statins, LFTs slightly trending down today.  Ammonia level normal. -Hepatitis panel negative -RUQ US showed nonspecific dilation of the CBD measuring 1.1 cm, if concern for biliary duct obstruction consider MRCP, moderate amount  of sludge in gallbladder, normal sonographic appearance of liver.   -LFTs improving, GI following, plan for MRI/MRCP  Anemia  -Hb 6.7 on 12/19, received 1 unit packed RBCs -per staff and pt, had "bled a lot" during access issues.  -Currently no active bleeding, hemoglobin 7.8  Paroxysmal atrial fibrillation with RVR -Continue Eliquis.  Heart rate controlled.  Right lung pneumonia -Chest x-ray showed consolidation at the right base -Currently on vancomycin and cefepime    Diabetes mellitus type II, IDDM with end stage renal disease (HCC) -Hemoglobin A1c 7.8 on 11/5 -Hypoglycemia episodes on 12/18, insulin 70/30 was discontinued.  Now having hyperglycemia, added Semglee  18 units daily, continue sliding scale insulin     PVD (peripheral vascular disease) ([place]), -Has history of right BKA   CAD (coronary artery disease), native coronary artery -Currently no acute chest pain, continue metoprolol, Lipitor, eliquis   Pacemaker: Med[employer]onic Advisa DR MRI [id number] Dual chamber pacemaker 10/08/2014  Anemia with thrombocytopenia (HCC) -Platelets normalized, H&H stable, 7.8  Acute on chronic hyponatremia -Possibly due to fluid overload, continue current management, -Sodium stable for now  Obesity Estimated body mass index is 32.86 kg/m as calculated from the following:   Height as of this encounter: 6' (1.829 m).   Weight as of this encounter: 109.9 kg.  Code Status: Full CODE STATUS DVT Prophylaxis:   apixaban (ELIQUIS) tablet 5 mg   Level of Care: Level of care: Telemetry Cardiac Family Communication: No family member at the bedside Disposition Plan:     Status is: Inpatient  Remains inpatient appropriate because: Acute respiratory failure with hypoxia, Infective endocarditis  Time Spent in minutes 25 minutes  Procedures:  2D echo TEE  Consultants:   Nephrology, Dr. Jonnie Finner Cardiology, Dr. [name] Infectious disease  Antimicrobials:   Anti-infectives (From admission, onward)    Start     Dose/Rate Route Frequency Ordered Stop   10/29/21 2100  ceFEPIme (MAXIPIME) 2 g in sodium chloride 0.9 % 100 mL IVPB        2 g 200 mL/hr over 30 Minutes Intravenous  Once 10/29/21 1445 10/30/21 0507   10/29/21 2100  vancomycin (VANCOCIN) IVPB 1000 mg/200 mL premix        1,000 mg 200 mL/hr over 60 Minutes Intravenous  Once 10/29/21 1445 10/30/21 0438   10/29/21 1200  vancomycin (VANCOCIN) IVPB 1000 mg/200 mL premix  Status:  Discontinued        1,000 mg 200 mL/hr over 60 Minutes Intravenous  Once 10/28/21 1422 10/29/21 1445   10/29/21 1200  ceFEPIme (MAXIPIME) 2 g in sodium chloride 0.9 % 100 mL IVPB  Status:  Discontinued        2 g 200 mL/hr over 30  Minutes Intravenous  Once 10/28/21 1422 10/29/21 1445   10/28/21 1414  vancomycin variable dose per unstable renal function (pharmacist dosing)         Does not apply See admin instructions 10/28/21 1422     10/28/21 1200  ceFEPIme (MAXIPIME) 2 g in sodium chloride 0.9 % 100 mL IVPB        2 g 200 mL/hr over 30 Minutes Intravenous Every T-Th-Sa (Hemodialysis) 10/27/21 1717     10/28/21 1200  vancomycin (VANCOCIN) IVPB 1000 mg/200 mL premix        1,000 mg 200 mL/hr over 60 Minutes Intravenous Every T-Th-Sa (Hemodialysis) 10/27/21 1717     10/27/21 2200  cefTRIAXone (ROCEPHIN) 1 g in sodium chloride 0.9 % 100 mL IVPB  Status:  Discontinued  1 g 200 mL/hr over 30 Minutes Intravenous Every 24 hours 10/26/21 0108 10/27/21 1653   10/27/21 1815  ceFEPIme (MAXIPIME) 2 g in sodium chloride 0.9 % 100 mL IVPB        2 g 200 mL/hr over 30 Minutes Intravenous  Once 10/27/21 1717 10/27/21 1920   10/27/21 1815  vancomycin (VANCOCIN) 2,500 mg in sodium chloride 0.9 % 500 mL IVPB        2,500 mg 250 mL/hr over 120 Minutes Intravenous  Once 10/27/21 1717 10/27/21 2202   10/25/21 2200  cefTRIAXone (ROCEPHIN) 2 g in sodium chloride 0.9 % 100 mL IVPB        2 g 200 mL/hr over 30 Minutes Intravenous  Once 10/25/21 2148 10/26/21 0029   10/25/21 2200  doxycycline (VIBRA-TABS) tablet 100 mg  Status:  Discontinued        100 mg Oral Every 12 hours 10/25/21 2148 10/27/21 1653          Medications  Scheduled Meds:  sodium chloride   Intravenous Once   apixaban  5 mg Oral BID   benzonatate  200 mg Oral TID   calcitRIOL  0.5 mcg Oral Q T,Th,Sa-HD   darbepoetin (ARANESP) injection - DIALYSIS  200 mcg Intravenous Q Thu-HD   diltiazem  120 mg Oral Daily   feeding supplement (NEPRO CARB STEADY)  237 mL Oral BID BM   furosemide  80 mg Oral Once per day on Sun Mon Wed Fri   insulin aspart  0-6 Units Subcutaneous TID WC   insulin glargine-yfgn  18 Units Subcutaneous Daily   lidocaine  1 patch  Transdermal Q24H   mouth rinse  15 mL Mouth Rinse BID   metoprolol tartrate  50 mg Oral BID   multivitamin  1 tablet Oral QHS   vancomycin variable dose per unstable renal function (pharmacist dosing)   Does not apply See admin instructions   Continuous Infusions:  sodium chloride 10 mL/hr at 10/27/21 1845   ceFEPime (MAXIPIME) IV     vancomycin     PRN Meds:.      Subjective:   Aneesh Faller was seen and examined today.  No bleeding, feeling somewhat better today, sitting upright.  No fevers or chills.  CBGs running somewhat elevated.   Objective:   Vitals:   10/31/21 1356 10/31/21 1455 10/31/21 1530 10/31/21 1600  BP: (!) 112/94 (!) 106/50 115/60 118/61  Pulse: 98     Resp: 20 16 16  (!) 22  Temp: 98.8 F (37.1 C) 98.7 F (37.1 C)    TempSrc:      SpO2: 99%     Weight: 109.9 kg     Height:        Intake/Output Summary (Last 24 hours) at 10/31/2021 1613 Last data filed at 10/31/2021 0300 Gross per 24 hour  Intake 410 ml  Output 475 ml  Net -65 ml     Wt Readings from Last 3 Encounters:  10/31/21 109.9 kg  10/01/21 112.3 kg  09/20/21 117.9 kg   Physical Exam General: Alert and oriented x 3, NAD Cardiovascular: S1 S2 clear, RRR. Respiratory: Fairly CTA B Gastrointestinal: Soft, nontender, nondistended, NBS Ext: right BKA Psych: Normal affect and demeanor, alert and oriented x3     Data Reviewed:  I have personally reviewed following labs and imaging studies  Micro Results Recent Results (from the past 240 hour(s))  Resp Panel by RT-PCR (Flu A&B, Covid) Nasopharyngeal Swab     Status: None  Collection Time: 10/25/21  6:32 PM   Specimen: Nasopharyngeal Swab; Nasopharyngeal(NP) swabs in vial transport medium  Result Value Ref Range Status   SARS Coronavirus 2 by RT PCR NEGATIVE NEGATIVE Final    Comment: (NOTE) SARS-CoV-2 target nucleic acids are NOT DETECTED.  The SARS-CoV-2 RNA is generally detectable in upper respiratory specimens during the  acute phase of infection. The lowest concentration of SARS-CoV-2 viral copies this assay can detect is 138 copies/mL. A negative result does not preclude SARS-Cov-2 infection and should not be used as the sole basis for treatment or other patient management decisions. A negative result may occur with  improper specimen collection/handling, submission of specimen other than nasopharyngeal swab, presence of viral mutation(s) within the areas targeted by this assay, and inadequate number of viral copies(<138 copies/mL). A negative result must be combined with clinical observations, patient history, and epidemiological information. The expected result is Negative.  Fact Sheet for Patients:  EntrepreneurPulse.com.au  Fact Sheet for Healthcare Providers:  IncredibleEmployment.be  This test is no t yet approved or cleared by the [place] FDA and  has been authorized for detection and/or diagnosis of SARS-CoV-2 by FDA under an Emergency Use Authorization (EUA). This EUA will remain  in effect (meaning this test can be used) for the duration of the COVID-19 declaration under Section 564(b)(1) of the Act, 21 U.S.C.section 360bbb-3(b)(1), unless the authorization is terminated  or revoked sooner.       Influenza A by PCR NEGATIVE NEGATIVE Final   Influenza B by PCR NEGATIVE NEGATIVE Final    Comment: (NOTE) The Xpert Xpress SARS-CoV-2/FLU/RSV plus assay is intended as an aid in the diagnosis of influenza from Nasopharyngeal swab specimens and should not be used as a sole basis for treatment. Nasal washings and aspirates are unacceptable for Xpert Xpress SARS-CoV-2/FLU/RSV testing.  Fact Sheet for Patients: EntrepreneurPulse.com.au  Fact Sheet for Healthcare Providers: IncredibleEmployment.be  This test is not yet approved or cleared by the [place] FDA and has been authorized for detection and/or  diagnosis of SARS-CoV-2 by FDA under an Emergency Use Authorization (EUA). This EUA will remain in effect (meaning this test can be used) for the duration of the COVID-19 declaration under Section 564(b)(1) of the Act, 21 U.S.C. section 360bbb-3(b)(1), unless the authorization is terminated or revoked.  Performed at [facility], [place] [address] [place], [place] [address]   Culture, blood (Routine X 2) w Reflex to ID Panel     Status: None (Preliminary result)   Collection Time: 10/27/21 10:19 AM   Specimen: Left Antecubital; Blood  Result Value Ref Range Status   Specimen Description LEFT ANTECUBITAL  Final   Special Requests   Final    BOTTLES DRAWN AEROBIC AND ANAEROBIC Blood Culture adequate volume   Culture   Final    NO GROWTH 4 DAYS Performed at [facility], [place] [address], [place], [place] [address]    Report Status PENDING  Incomplete  Culture, blood (Routine X 2) w Reflex to ID Panel     Status: None (Preliminary result)   Collection Time: 10/27/21 10:36 AM   Specimen: BLOOD LEFT ARM  Result Value Ref Range Status   Specimen Description BLOOD LEFT ARM  Final   Special Requests   Final    BOTTLES DRAWN AEROBIC AND ANAEROBIC Blood Culture adequate volume   Culture   Final    NO GROWTH 4 DAYS Performed at [facility], [place] [address], [place], [place] [address]  Report Status PENDING  Incomplete    Radiology Reports DG Chest 2 View  Result Date: 10/25/2021 CLINICAL DATA:  Shortness of breath EXAM: CHEST - 2 VIEW COMPARISON:  09/30/2021 FINDINGS: Left-sided pacing device as before. Cardiomegaly. Suspicion of moderate right pleural effusion. Hazy airspace disease and consolidation at the right base. Aortic atherosclerosis. No pneumothorax. IMPRESSION: 1. Clearing of previously noted left upper lobe pneumonia 2. New hazy airspace disease and consolidation at right base suspicious for right basilar pneumonia. Suspect that there is moderate right pleural  effusion 3. Cardiomegaly Electronically Signed   By: Donavan Foil M.D.   On: 10/25/2021 19:28   CT CHEST WO CONTRAST  Result Date: 10/27/2021 CLINICAL DATA:  Pneumonia EXAM: CT CHEST WITHOUT CONTRAST TECHNIQUE: Multidetector CT imaging of the chest was performed following the standard protocol without IV contrast. COMPARISON:  Previous studies including the radiographs done on 10/25/2021 and CT done on 09/16/2021 FINDINGS: Cardiovascular: Coronary artery calcifications are seen. Heart is enlarged in size. Biventricular pacer leads are noted in place. Mediastinum/Nodes: No significant lymphadenopathy seen. Lungs/Pleura: There is significant interval decrease in patchy infiltrate in the left upper lobe. Small nodular infiltrate seen in the right upper lung fields in the previous study could not be distinctly identified. There is interval appearance of large patchy alveolar infiltrates in the right lower lobe and to a lesser extent in the right middle lobe. There is small to moderate right pleural effusion. Part of the effusion appears to be loculated in the interlobar fissure. There is no pneumothorax. Upper Abdomen: Stomach is distended with fluid. Musculoskeletal: Unremarkable. IMPRESSION: There is a almost complete interval clearing of small infiltrate in the left upper lobe and clearing of small infiltrate in the right upper lobe. There is interval appearance of large patchy alveolar infiltrates in the right lower lobe and to a lesser extent in the right middle lobe. Findings suggest large area of pneumonia. There is interval appearance of small to moderate right pleural effusion part of which appears to be loculated in the periphery of right lower lung fields and within major fissure. Possibility of empyema is not excluded. Other findings as described in the body of the report. Electronically Signed   By: Elmer Picker M.D.   On: 10/27/2021 19:19   CT LUMBAR SPINE WO CONTRAST  Result Date:  10/28/2021 CLINICAL DATA:  Lumbar radiculopathy. Back pain with persistent symptoms for over 6 weeks of treatment. EXAM: CT LUMBAR SPINE WITHOUT CONTRAST TECHNIQUE: Multidetector CT imaging of the lumbar spine was performed without intravenous contrast administration. Multiplanar CT image reconstructions were also generated. COMPARISON:  09/16/2021 FINDINGS: Segmentation: 5 lumbar type vertebrae Alignment: Physiologic Vertebrae: Interval endplate fractures at L2 inferiorly, L3 superiorly, and L5 inferiorly. These have a nonacute appearance but may be incompletely healed. No overt endplate destruction, lucency in the L2 and L3 bodies is adjacent to the fracture deformities. Paraspinal and other soft tissues: Severe atheromatous plaque of the aorta and branch vessels. Small right pleural effusion. Body wall edema. Disc levels: T12- L1: Unremarkable. L1-L2: Disc narrowing with spondylitic and facet spurring. No noted impingement L2-L3: Disc narrowing and bulging. Facet spurring and ligamentum flavum thickening with severe spinal stenosis. Moderate bilateral foraminal narrowing asymmetric to the right L3-L4: Disc narrowing and bulging with endplate spurring. Laminectomy with patent spinal canal L4-L5: Disc narrowing and bulging with endplate spurring. Laminectomy. L5-S1:Disc collapse with endplate and facet spurring. Moderate foraminal narrowing on both sides. IMPRESSION: 1. Subacute appearing endplate fractures at L2, L3, and L5. These  have occurred since November 2022. 2. Lumbar spine degeneration exacerbated by short pedicles. Advanced spinal stenosis at L2-3. Electronically Signed   By: Jorje Guild M.D.   On: 10/28/2021 11:45   ECHOCARDIOGRAM COMPLETE  Result Date: 10/27/2021    ECHOCARDIOGRAM REPORT   Patient Name:   THELTON GRACA [facility] Date of Exam: 10/26/2021 Medical Rec #:  [id number]     Height:       72.0 in Accession #:    [id number]    Weight:       247.5 lb Date of Birth:  Sep 26, 1961     BSA:           2.333 m Patient Age:    13 years      BP:           115/52 mmHg Patient Gender: M             HR:           117 bpm. Exam Location:  Inpatient Procedure: 2D Echo, Cardiac Doppler, Color Doppler and Intracardiac            Opacification Agent Indications:     CHF  History:         Patient has prior history of Echocardiogram examinations, most                  recent 05/24/2020. Pacemaker, Arrythmias:Atrial Fibrillation and                  Tachycardia, Signs/Symptoms:Shortness of Breath; Risk                  Factors:Hypertension and Diabetes. Pneumonia.  Sonographer:     Merrie Roof RDCS Referring Phys:  [id number] [facility] Diagnosing Phys: Adrian Prows MD IMPRESSIONS  1. Left ventricular ejection fraction, by estimation, is 40 to 45%. The left ventricle has mildly decreased function. The left ventricle demonstrates global hypokinesis. There is severe concentric left ventricular hypertrophy. Left ventricular diastolic  parameters are indeterminate.  2. Right ventricular systolic function is normal. The right ventricular size is normal. There is mildly elevated pulmonary artery systolic pressure. The estimated right ventricular systolic pressure is 19.5 mmHg.  3. Left atrial size was moderately dilated.  4. Right atrial size was mildly dilated.  5. The mitral valve is normal in structure. Moderate mitral valve regurgitation.  6. Vegetation or a thrombus measuring 1.2x1.2 cm seen on the RV lead of the pacemaker. Leaflet involvement cannot be excluded. . The tricuspid valve is degenerative. Tricuspid valve regurgitation is moderate to severe.  7. The aortic valve is normal in structure. Aortic valve regurgitation is not visualized. No aortic stenosis is present.  8. The inferior vena cava is dilated in size with <50% respiratory variability, suggesting right atrial pressure of 15 mmHg. FINDINGS  Left Ventricle: Left ventricular ejection fraction, by estimation, is 40 to 45%. The left ventricle has mildly decreased  function. The left ventricle demonstrates global hypokinesis. The left ventricular internal cavity size was normal in size. There is  severe concentric left ventricular hypertrophy. Abnormal (paradoxical) septal motion, consistent with RV pacemaker. Left ventricular diastolic parameters are indeterminate. Right Ventricle: The right ventricular size is normal. No increase in right ventricular wall thickness. Right ventricular systolic function is normal. There is mildly elevated pulmonary artery systolic pressure. The tricuspid regurgitant velocity is 2.42  m/s, and with an assumed right atrial pressure of 15 mmHg, the estimated right ventricular systolic pressure is 09.3 mmHg. Left Atrium:  Left atrial size was moderately dilated. Right Atrium: Right atrial size was mildly dilated. Pericardium: There is no evidence of pericardial effusion. Mitral Valve: The mitral valve is normal in structure. There is moderate thickening of the mitral valve leaflet(s). Moderate mitral valve regurgitation. Tricuspid Valve: Vegetation or a thrombus measuring 1.2x1.2 cm seen on the RV lead of the pacemaker. Leaflet involvement cannot be excluded. The tricuspid valve is degenerative in appearance. Tricuspid valve regurgitation is moderate to severe. Aortic Valve: The aortic valve is normal in structure. Aortic valve regurgitation is not visualized. No aortic stenosis is present. Aortic valve mean gradient measures 3.0 mmHg. Aortic valve peak gradient measures 6.1 mmHg. Aortic valve area, by VTI measures 2.03 cm. Pulmonic Valve: The pulmonic valve was normal in structure. Pulmonic valve regurgitation is not visualized. No evidence of pulmonic stenosis. Aorta: The aortic root is normal in size and structure. Venous: The inferior vena cava is dilated in size with less than 50% respiratory variability, suggesting right atrial pressure of 15 mmHg. IAS/Shunts: No atrial level shunt detected by color flow Doppler.  LEFT VENTRICLE PLAX 2D  LVIDd:         3.80 cm LVIDs:         3.00 cm LV PW:         1.70 cm LV IVS:        1.70 cm LVOT diam:     2.00 cm LV SV:         35 LV SV Index:   15 LVOT Area:     3.14 cm  LV Volumes (MOD) LV vol d, MOD A2C: 145.0 ml LV vol d, MOD A4C: 131.0 ml LV vol s, MOD A2C: 56.7 ml LV vol s, MOD A4C: 109.0 ml LV SV MOD A2C:     88.3 ml LV SV MOD A4C:     131.0 ml LV SV MOD BP:      62.4 ml RIGHT VENTRICLE             IVC RV Basal diam:  4.70 cm     IVC diam: 2.30 cm RV Mid diam:    4.60 cm RV S prime:     14.30 cm/s TAPSE (M-mode): 1.8 cm LEFT ATRIUM              Index        RIGHT ATRIUM           Index LA diam:        3.50 cm  1.50 cm/m   RA Area:     41.40 cm LA Vol (A2C):   100.0 ml 42.87 ml/m  RA Volume:   174.00 ml 74.60 ml/m LA Vol (A4C):   100.0 ml 42.87 ml/m LA Biplane Vol: 107.0 ml 45.87 ml/m  AORTIC VALVE AV Area (Vmax):    2.23 cm AV Area (Vmean):   1.92 cm AV Area (VTI):     2.03 cm AV Vmax:           123.00 cm/s AV Vmean:          87.200 cm/s AV VTI:            0.174 m AV Peak Grad:      6.1 mmHg AV Mean Grad:      3.0 mmHg LVOT Vmax:         87.50 cm/s LVOT Vmean:        53.300 cm/s LVOT VTI:          0.112 m  LVOT/AV VTI ratio: 0.65  AORTA Ao Root diam: 3.60 cm Ao Asc diam:  3.00 cm TRICUSPID VALVE TR Peak grad:   23.4 mmHg TR Vmax:        242.00 cm/s  SHUNTS Systemic VTI:  0.11 m Systemic Diam: 2.00 cm Adrian Prows MD Electronically signed by Adrian Prows MD Signature Date/Time: 10/27/2021/5:33:46 PM    Final    ECHO TEE  Result Date: 10/27/2021    TRANSESOPHOGEAL ECHO REPORT   Patient Name:   MICHIEL SIVLEY [facility] Date of Exam: 10/27/2021 Medical Rec #:  [id number]     Height:       72.0 in Accession #:    [id number]    Weight:       236.8 lb Date of Birth:  13-Jun-1961     BSA:          2.289 m Patient Age:    64 years      BP:           119/68 mmHg Patient Gender: M             HR:           82 bpm. Exam Location:  Inpatient Procedure: Transesophageal Echo, Cardiac Doppler and Color Doppler Indications:      endocarditis  History:         Patient has prior history of Echocardiogram examinations, most                  recent 10/26/2021. CHF, CAD, Pacemaker, Arrythmias:Atrial                  Fibrillation; Risk Factors:Diabetes.  Sonographer:     Johny Chess RDCS Referring Phys:  [place] Diagnosing Phys: Adrian Prows MD PROCEDURE: After discussion of the risks and benefits of a TEE, an informed consent was obtained from the patient. The transesophogeal probe was passed without difficulty through the esophogus of the patient. Imaged were obtained with the patient in a left lateral decubitus position. Local oropharyngeal anesthetic was provided with Cetacaine. Sedation performed by performing physician. The patient was monitored while under deep sedation. Patients was under conscious sedation during this procedure. Anesthetic administered: 38mcg of Fentanyl, 2.0mg  of Versed. The patient developed no complications during the procedure. IMPRESSIONS  1. Left ventricular ejection fraction, by estimation, is 40 to 45%. The left ventricle has mildly decreased function. The left ventricle demonstrates global hypokinesis. There is moderate concentric left ventricular hypertrophy.  2. Right ventricular systolic function is normal. The right ventricular size is normal.  3. Left atrial size was moderately dilated. No left atrial/left atrial appendage thrombus was detected. The LAA emptying velocity was 30 cm/s.  4. Right atrial size was mildly dilated. There is a 1x1cm mass attached to the RA pacemaker lead suggestive of thrombus or a vegetation.  5. The mitral valve is normal in structure. Mild to moderate mitral valve regurgitation. No evidence of mitral stenosis.  6. There is a 2cmx1cm mass attached to the RV lead of the pacemaker suggestive of a vegetation or a thrombus. The Anterior and septal TV leaflets are thickened and suggests involvement with the vegetation. The septal leaflet is flail. The presence of flail  leaflet suggests vegetation. . The tricuspid valve is degenerative. Tricuspid valve regurgitation is severe.  7. The aortic valve is normal in structure. Aortic valve regurgitation is not visualized. No aortic stenosis is present.  8. There is mild (Grade II) plaque involving the ascending aorta, aortic arch  and descending aorta. Comparison(s): A prior study was performed on 05/24/2120. Previously TTE had revealed TV was normal and mild TR. LVEF was normal. FINDINGS  Left Ventricle: Left ventricular ejection fraction, by estimation, is 40 to 45%. The left ventricle has mildly decreased function. The left ventricle demonstrates global hypokinesis. The left ventricular internal cavity size was normal in size. There is  moderate concentric left ventricular hypertrophy. Abnormal (paradoxical) septal motion, consistent with RV pacemaker. Right Ventricle: The right ventricular size is normal. No increase in right ventricular wall thickness. Right ventricular systolic function is normal. Left Atrium: Left atrial size was moderately dilated. No left atrial/left atrial appendage thrombus was detected. The LAA emptying velocity was 30 cm/s. Right Atrium: Right atrial size was mildly dilated. Prominent Eustachian valve and There is a 1x1cm mass attached to the RA pacemaker lead suggestive of thrombus or a vegetation. Pericardium: Trivial pericardial effusion is present. Mitral Valve: The mitral valve is normal in structure. Mild to moderate mitral valve regurgitation. No evidence of mitral valve stenosis. Tricuspid Valve: There is a 2cmx1cm mass attached to the RV lead of the pacemaker suggestive of a vegetation or a thrombus. The Anterior and septal TV leaflets are thickened and suggests involvement with the vegetation. The septal leaflet is flail. The presence of flail leaflet suggests vegetation. The tricuspid valve is degenerative in appearance. Tricuspid valve regurgitation is severe. Aortic Valve: The aortic valve is  normal in structure. Aortic valve regurgitation is not visualized. No aortic stenosis is present. Pulmonic Valve: The pulmonic valve was normal in structure. Pulmonic valve regurgitation is trivial. Aorta: The aortic root is normal in size and structure. There is mild (Grade II) plaque involving the ascending aorta, aortic arch and descending aorta. IAS/Shunts: No atrial level shunt detected by color flow Doppler. Adrian Prows MD Electronically signed by Adrian Prows MD Signature Date/Time: 10/27/2021/5:28:28 PM    Final    US Abdomen Limited RUQ (LIVER/GB)  Result Date: 10/29/2021 CLINICAL DATA:  Transaminitis EXAM: ULTRASOUND ABDOMEN LIMITED RIGHT UPPER QUADRANT COMPARISON:  CT abdomen pelvis 09/17/2019 FINDINGS: Gallbladder: No gallstones or wall thickening visualized. Moderate amount of sludge. No sonographic Murphy sign noted by sonographer. Common bile duct: Diameter: 1.1 cm, dilated. Liver: No focal lesion identified. Within normal limits in parenchymal echogenicity. Portal vein is patent on color Doppler imaging with normal direction of blood flow towards the liver. Other: None. IMPRESSION: 1. Nonspecific dilation of the common bile duct measuring 1.1 cm. If there is concern for biliary duct obstruction consider MRI/MRCP. 2.  Moderate amount of sludge in the gallbladder. 3.  Normal sonographic appearance of the liver. Electronically Signed   By: Audie Pinto M.D.   On: 10/29/2021 12:35    Lab Data:  CBC: Recent Labs  Lab 10/25/21 1828 10/26/21 1055 10/28/21 0140 10/29/21 0240 10/30/21 1552 10/31/21 0427  WBC 13.0* 10.8* 16.0* 22.0* 14.3* 13.9*  NEUTROABS 11.4*  --   --   --   --   --   HGB 8.9* 7.1* 7.4* 8.0* 6.7* 7.8*  HCT 29.2* 23.3* 24.4* 27.4* 22.1* 25.0*  MCV 88.8 87.9 88.4 91.6 89.1 88.0  PLT 97* 91* 136* 153 143* 456*   Basic Metabolic Panel: Recent Labs  Lab 10/26/21 1055 10/28/21 0140 10/29/21 0852 10/30/21 1552 10/31/21 0427  NA 130* 131* 130* 133* 131*  K 3.6 4.4  4.2 4.2 4.1  CL 92* 94* 94* 96* 96*  CO2 27 26 25 27 25   GLUCOSE 148* 164* 134* 221* 283*  BUN  36* 44* 54* 31* 41*  CREATININE 3.65* 4.23* 5.34* 3.58* 4.15*  CALCIUM 7.3* 7.8* 7.8* 7.9* 7.6*  PHOS 4.3  --   --   --   --    GFR: Estimated Creatinine Clearance: 23.6 mL/min (A) (by C-G formula based on SCr of 4.15 mg/dL (H)). Liver Function Tests: Recent Labs  Lab 10/25/21 1828 10/28/21 0140 10/29/21 0852 10/30/21 1552 10/31/21 0427  AST 92* 240* 180* 71* 42*  ALT 73* 164* 174* 115* 87*  ALKPHOS 82 93 80 74 65  BILITOT 1.7* 1.4* 1.8* 1.9* 1.9*  PROT 6.5 5.6* 5.5* 5.4* 5.3*  ALBUMIN 2.1* 2.0* 1.8* 1.7* 1.6*   No results for input(s): LIPASE, AMYLASE in the last 168 hours. Recent Labs  Lab 10/29/21 0852 10/30/21 1422  AMMONIA 21 20   Coagulation Profile: Recent Labs  Lab 10/30/21 1629  INR 3.0*   Cardiac Enzymes: No results for input(s): CKTOTAL, CKMB, CKMBINDEX, TROPONINI in the last 168 hours. BNP (last 3 results) No results for input(s): PROBNP in the last 8760 hours. HbA1C: Recent Labs    10/29/21 0852  HGBA1C 6.8*   CBG: Recent Labs  Lab 10/30/21 1140 10/30/21 1632 10/30/21 2131 10/31/21 0743 10/31/21 1148  GLUCAP 177* 269* 270* 274* 284*   Lipid Profile: No results for input(s): CHOL, HDL, LDLCALC, TRIG, CHOLHDL, LDLDIRECT in the last 72 hours. Thyroid Function Tests: No results for input(s): TSH, T4TOTAL, FREET4, T3FREE, THYROIDAB in the last 72 hours. Anemia Panel: Recent Labs    10/30/21 1629  FERRITIN 2,418*   Urine analysis:    Component Value Date/Time   COLORURINE YELLOW 05/23/2020 2057   APPEARANCEUR CLEAR 05/23/2020 2057   LABSPEC 1.008 05/23/2020 2057   PHURINE 5.0 05/23/2020 2057   GLUCOSEU 50 (A) 05/23/2020 2057   HGBUR LARGE (A) 05/23/2020 2057   BILIRUBINUR NEGATIVE 05/23/2020 2057   BILIRUBINUR neg 10/01/2014 1445   KETONESUR NEGATIVE 05/23/2020 2057   PROTEINUR 100 (A) 05/23/2020 2057   UROBILINOGEN 0.2 10/01/2014 1445    UROBILINOGEN 0.2 09/30/2014 2051   NITRITE NEGATIVE 05/23/2020 2057   LEUKOCYTESUR NEGATIVE 05/23/2020 2057     Dishawn Bhargava M.D. [facility] 10/31/2021, 4:13 PM  Available via Epic secure chat 7am-7pm After 7 pm, please refer to night coverage provider listed on amion.

## 2021-10-31 NOTE — Consult Note (Addendum)
Hospital Consult    Reason for Consult:  poorly functioning right arm AVF Requesting Physician:  Nephrology MRN #:  [id number]  History of Present Illness: This is a 60 y.o. male being seen in consultation for evaluation of right arm AV fistula.  He is well-known to the vascular surgery service with history of right lower extremity bypass graft with subsequent right below the knee amputation.  He also has remote history of left femoral to TP trunk bypass and TMA.  Right arm access is a two-stage basilic vein fistula which was completed in September of this year.  The patient and dialysis tech state there is a lot of needle hole bleeding and trouble cannulating fistula.  He is seen on HD currently on dialysis via right arm AV fistula.  He denies any rest pain in his left foot.  He has some skin changes to his right BKA stump however has plans to return to [facility] to be fitted for a new limb shrinker and or prosthetic leg.  Past Medical History:  Diagnosis Date   Alcohol abuse    Anemia    Arthritis    "patient does not think so."   Atrial fibrillation ([facility])    Cardiac syncope 10/07/2014   rapid A fib with 8 sec pauses on converison with syncope- temp pacing wire placed then PPM   Cataract    BILATERAL   Coronary artery disease    Diabetes mellitus    dx---been  awhile   ESRD (end stage renal disease) ([place])    [place]   GERD (gastroesophageal reflux disease)    History of blood transfusion    Hyperlipemia    Hypertension    Osteomyelitis ([place])    right foot   Peripheral vascular disease ([place])    Presence of permanent cardiac pacemaker 10/08/2014   [employer]    Past Surgical History:  Procedure Laterality Date   A/V FISTULAGRAM Right 05/01/2021   Procedure: A/V FISTULAGRAM;  Surgeon: Waynetta Sandy, MD;  Location: [place] [facility];  Service: Cardiovascular;  Laterality: Right;  failed fistulagram   ABDOMINAL AORTOGRAM W/LOWER EXTREMITY N/A  09/09/2017   Procedure: ABDOMINAL AORTOGRAM W/LOWER EXTREMITY;  Surgeon: Angelia Mould, MD;  Location: [place] [facility];  Service: Cardiovascular;  Laterality: N/A;   ABDOMINAL AORTOGRAM W/LOWER EXTREMITY Right 11/11/2019   Procedure: ABDOMINAL AORTOGRAM W/LOWER EXTREMITY;  Surgeon: Marty Heck, MD;  Location: [place] [facility];  Service: Cardiovascular;  Laterality: Right;   AMPUTATION Left 08/19/2013   Procedure: AMPUTATION RAY;  Surgeon: Alta Corning, MD;  Location: [place];  Service: Orthopedics;  Laterality: Left;  ray amputation left 5th   AMPUTATION Left 10/27/2013   Procedure: AMPUTATION DIGIT-LEFT 4TH TOE, 4th and 5th metatarsal.;  Surgeon: Angelia Mould, MD;  Location: [place];  Service: Vascular;  Laterality: Left;   AMPUTATION Left 11/04/2013   Procedure: LEFT FOOT TRANS-METATARSAL AMPUTATION WITH WOUND CLOSURE ;  Surgeon: Alta Corning, MD;  Location: [place];  Service: Orthopedics;  Laterality: Left;   AMPUTATION Right 09/10/2017   Procedure: RIGHT FOURTH AND FIFTH TOE AMPUTATION;  Surgeon: Angelia Mould, MD;  Location: [place];  Service: Vascular;  Laterality: Right;   AMPUTATION Right 02/19/2020   Procedure: AMPUTATION RIGHT 3RD TOE;  Surgeon: Angelia Mould, MD;  Location: [place];  Service: Vascular;  Laterality: Right;   AMPUTATION Right 03/11/2020   Procedure: RIGHT BELOW KNEE AMPUTATION;  Surgeon: Newt Minion, MD;  Location: [place];  Service: Orthopedics;  Laterality: Right;   APPLICATION OF WOUND VAC Right 02/19/2020   Procedure: Application Of Wound Vac Right foot;  Surgeon: Angelia Mould, MD;  Location: [place];  Service: Vascular;  Laterality: Right;   AV FISTULA PLACEMENT Right 03/27/2021   Procedure: RIGHT ARM ARTERIOVENOUS (AV) FISTULA;  Surgeon: Angelia Mould, MD;  Location: [place];  Service: Vascular;  Laterality: Right;   AV FISTULA PLACEMENT Right 05/10/2021   Procedure: RIGHT ARM ARTERIOVENOUS (AV) FISTULA;   Surgeon: Waynetta Sandy, MD;  Location: [place];  Service: Vascular;  Laterality: Right;   [place] Right 08/11/2021   Procedure: RIGHT ARM SECOND STAGE BASILIC VEIN TRANSPOSITION;  Surgeon: Waynetta Sandy, MD;  Location: [place];  Service: Vascular;  Laterality: Right;   BIOPSY  07/20/2020   Procedure: BIOPSY;  Surgeon: Irving Copas., MD;  Location: [place];  Service: Gastroenterology;;   CARDIAC CATHETERIZATION  10/07/2014   Procedure: LEFT HEART CATH AND CORONARY ANGIOGRAPHY;  Surgeon: Laverda Page, MD;  Location: [facility] CATH LAB;  Service: Cardiovascular;;   COLONOSCOPY W/ POLYPECTOMY     COLONOSCOPY WITH PROPOFOL N/A 05/25/2020   Procedure: COLONOSCOPY WITH PROPOFOL;  Surgeon: Gatha Mayer, MD;  Location: [facility];  Service: Endoscopy;  Laterality: N/A;   EMBOLECTOMY Right 09/12/2017   Procedure: Thrombectomy  & Redo Right Below Knee Popliteal Artery Bypass Graft  ;  Surgeon: Waynetta Sandy, MD;  Location: [facility] OR;  Service: Vascular;  Laterality: Right;   ENTEROSCOPY N/A 07/20/2020   Procedure: ENTEROSCOPY;  Surgeon: Rush Landmark Telford Nab., MD;  Location: [place];  Service: Gastroenterology;  Laterality: N/A;   ESOPHAGOGASTRODUODENOSCOPY (EGD) WITH PROPOFOL N/A 05/24/2020   Procedure: ESOPHAGOGASTRODUODENOSCOPY (EGD) WITH PROPOFOL;  Surgeon: Ladene Artist, MD;  Location: [facility];  Service: Endoscopy;  Laterality: N/A;   EYE SURGERY Bilateral    cataract   FEMORAL-TIBIAL BYPASS GRAFT Left 10/27/2013   Procedure: BYPASS GRAFT LEFT FEMORAL- POSTERIOR TIBIAL ARTERY;  Surgeon: Angelia Mould, MD;  Location: [place];  Service: Vascular;  Laterality: Left;   FEMORAL-TIBIAL BYPASS GRAFT Right 09/10/2017   Procedure: RIGHT SUPERFICIAL  FEMORAL ARTERY-BELOW KNEE POPLITEAL ARTERY BYPASS GRAFT WITH VEIN;  Surgeon: Angelia Mould, MD;  Location: [place];  Service: Vascular;  Laterality: Right;    GIVENS CAPSULE STUDY N/A 07/20/2020   Procedure: GIVENS CAPSULE STUDY;  Surgeon: Irving Copas., MD;  Location: [place];  Service: Gastroenterology;  Laterality: N/A;  capsule placed through scope into duodenum @ 0936   I & D EXTREMITY Left 10/27/2013   Procedure: IRRIGATION AND DEBRIDEMENT EXTREMITY- LEFT FOOT;  Surgeon: Angelia Mould, MD;  Location: [place];  Service: Vascular;  Laterality: Left;   I & D EXTREMITY Right 01/26/2020   Procedure: IRRIGATION AND DEBRIDEMENT EXTREMITY RIGHT FOOT WOUND;  Surgeon: Angelia Mould, MD;  Location: [place];  Service: Vascular;  Laterality: Right;   INSERT / REPLACE / REMOVE PACEMAKER     INTRAOPERATIVE ARTERIOGRAM Right 09/10/2017   Procedure: INTRA OPERATIVE ARTERIOGRAM;  Surgeon: Angelia Mould, MD;  Location: [place];  Service: Vascular;  Laterality: Right;   IR ANGIOGRAM FOLLOW UP STUDY  09/09/2017   IR FLUORO GUIDE CV LINE RIGHT  05/31/2020   IR US GUIDE VASC ACCESS RIGHT  06/01/2020   LEFT HEART CATH AND CORONARY ANGIOGRAPHY N/A 08/19/2020   Procedure: LEFT HEART CATH AND CORONARY ANGIOGRAPHY;  Surgeon: Adrian Prows,  MD;  Location: [place] [facility];  Service: Cardiovascular;  Laterality: N/A;   LEFT HEART CATHETERIZATION WITH CORONARY ANGIOGRAM N/A 11/09/2014   Procedure: LEFT HEART CATHETERIZATION WITH CORONARY ANGIOGRAM;  Surgeon: Laverda Page, MD;  Location: [facility] C[facility] LAB;  Service: Cardiovascular;  Laterality: N/A;   LOWER EXTREMITY ANGIOGRAM Right 11/08/2015   Procedure: Lower Extremity Angiogram;  Surgeon: Serafina Mitchell, MD;  Location: [place] [facility];  Service: Cardiovascular;  Laterality: Right;   LUMBAR LAMINECTOMY  06/13/2012   Procedure: MICRODISCECTOMY LUMBAR LAMINECTOMY;  Surgeon: Jessy Oto, MD;  Location: [place];  Service: Orthopedics;  Laterality: N/A;  Central laminectomy L2-3, L3-4, L4-5   PACEMAKER INSERTION  10/08/2014   MDT Advisa MRI compatible dual chamber pacemaker implanted by Dr  Caryl Comes for syncope with post-termination pauses   PERCUTANEOUS CORONARY STENT INTERVENTION (PCI-S)  11/09/2014   des to lad & distal circumflex         Dr  [name]   PERIPHERAL VASCULAR BALLOON ANGIOPLASTY Right 11/11/2019   Procedure: PERIPHERAL VASCULAR BALLOON ANGIOPLASTY;  Surgeon: Marty Heck, MD;  Location: [place] [facility];  Service: Cardiovascular;  Laterality: Right;  below knee popliteal, tibioperoneal trunk, posterior tibial arteries   PERIPHERAL VASCULAR CATHETERIZATION N/A 11/08/2015   Procedure: Abdominal Aortogram;  Surgeon: Serafina Mitchell, MD;  Location: [place] [facility];  Service: Cardiovascular;  Laterality: N/A;   PERIPHERAL VASCULAR CATHETERIZATION Right 11/08/2015   Procedure: Peripheral Vascular Atherectomy;  Surgeon: Serafina Mitchell, MD;  Location: [place] [facility];  Service: Cardiovascular;  Laterality: Right;   PERIPHERAL VASCULAR CATHETERIZATION N/A 10/10/2016   Procedure: Lower Extremity Angiography;  Surgeon: Waynetta Sandy, MD;  Location: [place] [facility];  Service: Cardiovascular;  Laterality: N/A;   PERIPHERAL VASCULAR CATHETERIZATION Right 10/10/2016   Procedure: Peripheral Vascular Atherectomy;  Surgeon: Waynetta Sandy, MD;  Location: [place] [facility];  Service: Cardiovascular;  Laterality: Right;  Popliteal   PERMANENT PACEMAKER INSERTION N/A 10/08/2014   Procedure: PERMANENT PACEMAKER INSERTION;  Surgeon: Deboraha Sprang, MD;  Location: [facility] CATH LAB;  Service: Cardiovascular;  Laterality: N/A;   SUBMUCOSAL TATTOO INJECTION  07/20/2020   Procedure: SUBMUCOSAL TATTOO INJECTION;  Surgeon: Irving Copas., MD;  Location: [place];  Service: Gastroenterology;;   TEE WITHOUT CARDIOVERSION N/A 10/27/2021   Procedure: TRANSESOPHAGEAL ECHOCARDIOGRAM (TEE);  Surgeon: Adrian Prows, MD;  Location: [facility] ENDOSCOPY;  Service: Cardiovascular;  Laterality: N/A;   TEMPORARY PACEMAKER INSERTION Bilateral 10/07/2014   Procedure: TEMPORARY  PACEMAKER INSERTION;  Surgeon: Laverda Page, MD;  Location: [facility] CATH [facility];  Service: Cardiovascular;  Laterality: Bilateral;   WOUND DEBRIDEMENT Right 02/19/2020   Procedure: DEBRIDEMENT WOUND RIGHT FOOT;  Surgeon: Angelia Mould, MD;  Location: [name];  Service: Vascular;  Laterality: Right;    Allergies  Allergen Reactions   Cytoxan [Cyclophosphamide] Other (See Comments)    Pancytopenia   Keflex [Cephalexin] Nausea And Vomiting   Tylenol [Acetaminophen] Other (See Comments)    Makes the patient sweat   Pletal [Cilostazol] Palpitations and Other (See Comments)    "Can hear heart beating loudly".    Prior to Admission medications   Medication Sig Start Date End Date Taking? Authorizing Provider  apixaban (ELIQUIS) 5 MG TABS tablet Take 1 tablet (5 mg total) by mouth 2 (two) times daily. Patient taking differently: Take 5 mg by mouth in the morning. 10/09/21 01/07/22 Yes Adrian Prows, MD  atorvastatin (LIPITOR) 20 MG tablet Take 1 tablet (20 mg total)  by mouth at bedtime. 10/01/21  Yes Aline August, MD  fluticasone (FLONASE) 50 MCG/ACT nasal spray Place 2 sprays into both nostrils daily.   Yes [provider]  furosemide (LASIX) 80 MG tablet Take 80 mg by mouth See admin instructions. Take 80 mg by mouth on (non-dialysis days) Mondays, Wednesdays, Fridays & Sundays in the morning as needed "to cause urination" 02/26/21  Yes [provider]  lidocaine-prilocaine (EMLA) cream Apply 1 application topically every dialysis (on Tues/Thurs/Sat).   Yes [provider]  metoprolol tartrate (LOPRESSOR) 25 MG tablet Take 1 tablet (25 mg total) by mouth 2 (two) times daily. 05/24/21 05/19/22 Yes Adrian Prows, MD  Multiple Vitamin (MULTIVITAMIN WITH MINERALS) TABS tablet Take 1 tablet by mouth 2 (two) times a week.   Yes [provider]  NOVOLIN 70/30 (70-30) 100 UNIT/ML injection Inject 50-60 Units into the skin See admin instructions. Inject 50-60 units into the  skin in the morning and evening   Yes [provider]  ACCU-CHEK FASTCLIX LANCETS MISC Check blood sugar TID & QHS 10/01/14   Advani, Vernon Prey, MD  blood glucose meter kit and supplies KIT Check blood sugar TID & QHS 02/10/15   Advani, Vernon Prey, MD  Blood Glucose Monitoring Suppl (ACCU-CHEK ADVANTAGE DIABETES) kit Use as instructed 10/01/14   Lorayne Marek, MD  glucose blood (ACCU-CHEK AVIVA PLUS) test strip Use as instructed 10/01/14   Advani, Vernon Prey, MD  glucose blood (CHOICE DM FORA G20 TEST STRIPS) test strip Check blood sugar TID & QHS 02/10/15   Advani, Vernon Prey, MD  glucose blood test strip Use as instructed 11/20/13   Reyne Dumas, MD  insulin NPH-regular Human (70-30) 100 UNIT/ML injection Inject 60 Units into the skin 2 (two) times daily with a meal. Patient not taking: Reported on 10/26/2021 10/01/21   Aline August, MD  methocarbamol (ROBAXIN) 500 MG tablet Take 500 mg by mouth 3 (three) times daily. Patient not taking: Reported on 10/26/2021 10/18/21   [provider]    Social History   Socioeconomic History   Marital status: Married    Spouse name: Not on file   Number of children: 2   Years of education: Not on file   Highest education level: Not on file  Occupational History   Occupation: Disabled  Tobacco Use   Smoking status: Former    Packs/day: 1.50    Years: 30.00    Pack years: 45.00    Types: Cigarettes    Quit date: 10/07/2014    Years since quitting: 7.0   Smokeless tobacco: Never  Vaping Use   Vaping Use: Never used  Substance and Sexual Activity   Alcohol use: Yes    Alcohol/week: 21.0 standard drinks    Types: 21 Glasses of wine per week    Comment: cut way back, none in the last month   Drug use: No   Sexual activity: Not on file  Other Topics Concern   Not on file  Social History Narrative   Not on file   Social Determinants of Health   Financial Resource Strain: Not on file  Food Insecurity: Not on file  Transportation Needs:  Not on file  Physical Activity: Not on file  Stress: Not on file  Social Connections: Not on file  Intimate Partner Violence: Not on file     Family History  Problem Relation Age of Onset   Diabetes type II Mother    Hypertension Mother    Diabetes Mother    Liver  cancer Father    Diabetes type II Sister    Breast cancer Sister    Diabetes Sister    Hypertension Sister    Diabetes type II Brother    Kidney failure Brother    Diabetes Brother    Hypertension Brother    Diabetes type II Sister     ROS: Otherwise negative unless mentioned in HPI  Physical Examination  Vitals:   10/31/21 1356 10/31/21 1455  BP: (!) 112/94 (!) 106/50  Pulse: 98   Resp: 20 16  Temp: 98.8 F (37.1 C) 98.7 F (37.1 C)  SpO2: 99%    Body mass index is 32.86 kg/m.  General:  WDWN in NAD Gait: Not observed HENT: WNL, normocephalic Pulmonary: normal non-labored breathing, without Rales, rhonchi,  wheezing Cardiac: regular Abdomen:  soft, NT/ND, no masses Skin: without rashes Vascular Exam/Pulses: Left foot warm to touch with no tissue loss; superficial tissue changes of right BKA Extremities: Difficult to evaluate right arm AV fistula as it is currently being used for HD Musculoskeletal: no muscle wasting or atrophy  Neurologic: A&O X 3;  No focal weakness or paresthesias are detected; speech is fluent/normal Psychiatric:  The pt has Normal affect. Lymph:  Unremarkable  CBC    Component Value Date/Time   WBC 13.9 (H) 10/31/2021 0427   RBC 2.84 (L) 10/31/2021 0427   HGB 7.8 (L) 10/31/2021 0427   HCT 25.0 (L) 10/31/2021 0427   PLT 107 (L) 10/31/2021 0427   MCV 88.0 10/31/2021 0427   MCH 27.5 10/31/2021 0427   MCHC 31.2 10/31/2021 0427   RDW 20.9 (H) 10/31/2021 0427   LYMPHSABS 0.8 10/25/2021 1828   MONOABS 0.8 10/25/2021 1828   EOSABS 0.0 10/25/2021 1828   BASOSABS 0.0 10/25/2021 1828    BMET    Component Value Date/Time   NA 131 (L) 10/31/2021 0427   K 4.1 10/31/2021  0427   CL 96 (L) 10/31/2021 0427   CO2 25 10/31/2021 0427   GLUCOSE 283 (H) 10/31/2021 0427   BUN 41 (H) 10/31/2021 0427   CREATININE 4.15 (H) 10/31/2021 0427   CREATININE 1.52 (H) 02/07/2015 1151   CALCIUM 7.6 (L) 10/31/2021 0427   GFRNONAA 15 (L) 10/31/2021 0427   GFRNONAA 50 (L) 02/07/2015 1151   GFRAA 13 (L) 07/21/2020 0934   GFRAA 58 (L) 02/07/2015 1151    COAGS: Lab Results  Component Value Date   INR 3.0 (H) 10/30/2021   INR 1.2 07/22/2020   INR 1.2 05/26/2020      ASSESSMENT/PLAN: This is a 60 y.o. male with PAD and ESRD on HD  -Trouble cannulating and needle hole bleeding from right arm basilic vein fistula which was created in September of this year -Patient is currently being dialyzed via right arm fistula however there are concerns about longevity of fistula -Patient will be scheduled for right arm fistulogram and possible intervention this week.  Due to holiday schedule this may have to be done on Friday.  The procedure was explained to the patient and he agrees to proceed.  Dr. Doren Custard will evaluate the patient later today and provide further treatment plans.   Dagoberto Ligas PA-C [facility] [phone]  I have interviewed the patient and examined the patient. I agree with the findings by the PA. The patient will be scheduled for a fistulogram Friday.   Gae Gallop, MD

## 2021-10-31 NOTE — Progress Notes (Signed)
° °         Daily Rounding Note  10/31/2021, 12:56 PM  LOS: 6 days   SUBJECTIVE:   Chief complaint:     Complaining of back pain.  No nausea.  He is hungry since he is n.p.o. awaiting the MRI  OBJECTIVE:         Vital signs in last 24 hours:    Temp:  [97.7 F (36.5 C)-98.6 F (37 C)] 98.6 F (37 C) (12/20 1147) Pulse Rate:  [78-97] 95 (12/20 1147) Resp:  [16-21] 19 (12/20 1147) BP: (95-120)/(59-77) 112/59 (12/20 1147) SpO2:  [91 %-99 %] 99 % (12/20 1147) Weight:  [107.8 kg] 107.8 kg (12/20 0408) Last BM Date: 10/31/21 Filed Weights   10/30/21 0456 10/30/21 0630 10/31/21 0408  Weight: 108 kg 107.9 kg 107.8 kg   General: Looks chronically unwell, overweight Heart: Irregularly irregular Chest: No labored breathing or cough Abdomen: Not tender but palpation of abdomen triggers his back pain.  Scant but normal quality bowel sounds Extremities: No CCE Neuro/Psych: Oriented x3.  Intake/Output from previous day: 12/19 0701 - 12/20 0700 In: 647 [P.O.:237; Blood:410] Out: 475 [Urine:475]  Intake/Output this shift: No intake/output data recorded.  Lab Results: Recent Labs    10/29/21 0240 10/30/21 1552 10/31/21 0427  WBC 22.0* 14.3* 13.9*  HGB 8.0* 6.7* 7.8*  HCT 27.4* 22.1* 25.0*  PLT 153 143* 107*   BMET Recent Labs    10/29/21 0852 10/30/21 1552 10/31/21 0427  NA 130* 133* 131*  K 4.2 4.2 4.1  CL 94* 96* 96*  CO2 25 27 25   GLUCOSE 134* 221* 283*  BUN 54* 31* 41*  CREATININE 5.34* 3.58* 4.15*  CALCIUM 7.8* 7.9* 7.6*   LFT Recent Labs    10/29/21 0852 10/30/21 1552 10/31/21 0427  PROT 5.5* 5.4* 5.3*  ALBUMIN 1.8* 1.7* 1.6*  AST 180* 71* 42*  ALT 174* 115* 87*  ALKPHOS 80 74 65  BILITOT 1.8* 1.9* 1.9*   PT/INR Recent Labs    10/30/21 1629  LABPROT 30.9*  INR 3.0*   Hepatitis Panel Recent Labs    10/29/21 0852  HEPBSAG NON REACTIVE  HCVAB NON REACTIVE  HEPAIGM NON REACTIVE  HEPBIGM NON  REACTIVE    Studies/Results: No results found.  ASSESMENT:   Elevated LFTs.  Dilated CBD.  Suspect DILI, CHF, cardiogenic cirrhosis, AIH, metabolic causes of liver disease.  LFTs improving.     Endocarditis.     A. fib, RVR.  On Eliquis for history PAF.  Previous cardiac stent, diastolic CHF.  INR is 3.  Right-sided pneumonia.     ESRD.     Splenomegaly     Acute on chronic anemia.      Thrombocytopenia at 107   PLAN     MRI/MRCP planned but they cannot get to them today as they have some other cases ahead of him.  We will let him eat today and n.p.o. after midnight for MRCP tomorrow.  Await results of pending labs to rule out autoimmune hepatitis and other causes for LFT abnormalities    Azucena Freed  10/31/2021, 12:56 PM Phone [phone]

## 2021-10-31 NOTE — Progress Notes (Signed)
Attempting to obtain pt for dialysis this am. Informed by primary nurse, Alleen Borne, that pt will be going for MRI today with unknown time. Tx will be readdressed later when scheduled time can be confirmed.

## 2021-11-01 ENCOUNTER — Inpatient Hospital Stay (HOSPITAL_COMMUNITY): Payer: Medicare Other

## 2021-11-01 LAB — APTT: aPTT: 41 seconds — ABNORMAL HIGH (ref 24–36)

## 2021-11-01 LAB — ANA W/REFLEX IF POSITIVE: Anti Nuclear Antibody (ANA): NEGATIVE

## 2021-11-01 LAB — CULTURE, BLOOD (ROUTINE X 2)
Culture: NO GROWTH
Culture: NO GROWTH
Special Requests: ADEQUATE
Special Requests: ADEQUATE

## 2021-11-01 LAB — GLUCOSE, CAPILLARY
Glucose-Capillary: 328 mg/dL — ABNORMAL HIGH (ref 70–99)
Glucose-Capillary: 287 mg/dL — ABNORMAL HIGH (ref 70–99)
Glucose-Capillary: 259 mg/dL — ABNORMAL HIGH (ref 70–99)
Glucose-Capillary: 268 mg/dL — ABNORMAL HIGH (ref 70–99)

## 2021-11-01 LAB — MITOCHONDRIAL ANTIBODIES: Mitochondrial M2 Ab, IgG: <20 Units (ref 0.0–20.0)

## 2021-11-01 LAB — IRON AND TIBC: Iron: 30 ug/dL — ABNORMAL LOW (ref 45–182)

## 2021-11-01 LAB — IGG: IgG (Immunoglobin G), Serum: 1487 mg/dL (ref 603–1613)

## 2021-11-01 LAB — ANTI-SMOOTH MUSCLE ANTIBODY, IGG: F-Actin IgG: 15 Units (ref 0–19)

## 2021-11-01 LAB — ALPHA-1-ANTITRYPSIN: A-1 Antitrypsin, Ser: 296 mg/dL — ABNORMAL HIGH (ref 101–187)

## 2021-11-01 MED ORDER — HEPARIN (PORCINE) 25000 UT/250ML-% IV SOLN
1700.0000 [IU]/h | INTRAVENOUS | Status: DC
  Administered 2021-11-01: 11:00:00 1400 [IU]/h via INTRAVENOUS
  Administered 2021-11-02: 02:00:00 1700 [IU]/h via INTRAVENOUS
  Filled 2021-11-01 (×2): qty 250

## 2021-11-01 MED ORDER — FUROSEMIDE 10 MG/ML IJ SOLN
INTRAMUSCULAR | Status: AC
  Administered 2021-11-01: 11:00:00 80 mg
  Filled 2021-11-01: qty 4

## 2021-11-01 NOTE — Progress Notes (Signed)
Per order, Changed device settings for MRI to VOO at 90 bpm Will program device back to pre-MRI settings after completion of exam, and send transmission.  

## 2021-11-01 NOTE — Progress Notes (Signed)
° °   Progress Note   Subjective  Chief Complaint:  Patient lying in bed back from MRI. Complaining of back pain, no abdominal pain, nausea, vomiting. Last bowel movement yesterday, no melena or hematochezia. Patient's had cough better with Robitussin, baseline shortness of breath, denies chest pain, fever, chills.    Objective   Vital signs in last 24 hours: Temp:  [97.4 F (36.3 C)-98.8 F (37.1 C)] 98.1 F (36.7 C) (12/21 1108) Pulse Rate:  [87-101] 87 (12/21 1108) Resp:  [16-22] 18 (12/21 1108) BP: (93-118)/(50-94) 104/61 (12/21 1108) SpO2:  [92 %-99 %] 92 % (12/21 0532) Weight:  [107.7 kg-109.9 kg] 107.7 kg (12/21 0500) Last BM Date: 10/31/21  General:  JWM, in no acute distress, ill appearing Heart:  distant heart sounds Irregular rhythm, no murmurs Pulm: Clear anteriorly Abdomen:  Soft, Obese AB, Normal bowel sounds.  no tenderness. Without guarding and Without rebound, without hepatomegaly. No fluid wave, no dullness to percussion. Extremities:  right BKA, left forefoot amputation with 1+ edema, ecchymosis Neurologic:  Alert and  oriented x3, grossly normal neurologically. 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  Intake/Output from previous day: 12/20 0701 - 12/21 0700 In: 290.1 [IV Piggyback:290.1] Out: 1937  Intake/Output this shift: No intake/output data recorded.  Lab Results: Recent Labs    10/30/21 1552 10/31/21 0427  WBC 14.3* 13.9*  HGB 6.7* 7.8*  HCT 22.1* 25.0*  PLT 143* 107*   BMET Recent Labs    10/30/21 1552 10/31/21 0427  NA 133* 131*  K 4.2 4.1  CL 96* 96*  CO2 27 25  GLUCOSE 221* 283*  BUN 31* 41*  CREATININE 3.58* 4.15*  CALCIUM 7.9* 7.6*   LFT Recent Labs    10/31/21 0427  PROT 5.3*  ALBUMIN 1.6*  AST 42*  ALT 87*  ALKPHOS 65  BILITOT 1.9*   PT/INR Recent Labs    10/30/21 1629  LABPROT 30.9*   INR 3.0*    Studies/Results: No results found.     Impression /plan   Elevated liver function, with elevation of alk phos, total bili slightly elevated and patient with recent endocarditis, pneumonia, multiple antibiotics, possible pulmonary infarcts.Marland Kitchen Ultrasound with nonspecific dilatation common bile duct 1.1.   Moderate amount of sludge in gallbladder. LFTs down trending INR 3.0, with splenomegaly MRCP showed no biliary duct dilatation or choledocholithiasis.  Limited due to motion. 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 Ferritin elevated, with MRCP  showed hemosiderosis will proceed with iron/TIBC, if elevated will get hemochromatosis lab however reviewing multiple iron/TIBC's and saturations in the past, patient has had low iron, low TIBC and low saturation ratios. Per patient he has received iron infusions in the past at dialysis, unknown of date.  Suspect DILI,  cardiogenic cirrhosis, metabolic causes. Alpha 1, IGG negative, pending other hepatocellular labs  Acute on chronic Anemia HGB 8.0 MCV 91.6  07/2020 Iron 41 Ferritin 401 B12 516 ESRD Normal colon, normal enteroscopy.    Atrial fibrillation with RVR (HCC) On eliquis   ESRD (end stage renal disease) ([place]) On dialysis  Splenomegaly   CAD (coronary artery disease), native coronary artery Being follow by cardiology   PVD (peripheral vascular disease) ([place]) S/p amputation RBKA, left forefoot    LOS: 7 days   Alexander [name]  11/01/2021, 12:57 PM

## 2021-11-01 NOTE — Progress Notes (Signed)
PROGRESS NOTE  Alexander [name]  CBS:[id number] DOB: 06-19-1961 DOA: 10/25/2021 PCP: [name], MD   Brief Narrative: Patient is a 60 year old male with ESRD on HD, TTS, paroxysmal A. fib on Eliquis, pacemaker, CAD status post stent, diastolic CHF, PVD with right BKA, prior history of GI bleed, diabetes mellitus type 2 presented with increasing shortness of breath and cough.  Per patient he was diagnosed with pneumonia in early November and completed course of antibiotics.  However he has not felt back to baseline since.  Has had persistent shortness of breath and coughing, fatigue.  Also noted some anterior and right lateral sided rib pain worse with coughing and worsening left lower extremity edema.  He did complete his HD last session prior to admission. In ED, patient was hypoxic down to 80%, was put on 2 L BNP 1000 181.  Chest x-ray showed right basilar pneumonia, he was placed on IV Rocephin and doxycycline.   Assessment & Plan: Principal Problem:   Tricuspid valve vegetation Active Problems:   Diabetes mellitus with end stage renal disease (HCC)   PVD (peripheral vascular disease) (HCC)   CAD (coronary artery disease), native coronary artery   Pacemaker: Medtronic Advisa DR MRI [id number] Dual chamber pacemaker 10/08/2014   ESRD (end stage renal disease) ([place])   Atrial fibrillation with RVR (HCC)   Thrombocytopenia (HCC)   Hyponatremia   Acute on chronic diastolic CHF (congestive heart failure) (HCC)   Transaminitis   Pacemaker infection ([place])  Acute hypoxic respiratory failure: Due to acute on chronic HFpEF, RLL CAP, PAF with RVR:  - Continue abx, antitussives, pulmonary hygiene  - Wean oxygen as tolerated  ESRD:  - Continue HD per nephrology, next planned is 12/23 after HD catheter placement. Planning HD catheter insertion Friday after eliquis washout per Dr. Scot Dock, [facility]. Heparin gtt.  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 No discitis or abscess on CT.  - Risk of worsening infection/embolization with pacemaker removal/reimplantation in setting of active infection is prohibitive, so plan is to treat with IV abx per ID (vancomycin and cefepime after HD thru 12/08/2021) with subsequent transition to oral agent(s). Has [facility] follow up 1/12.   Acute blood loss anemia: Required 1u PRBCs 12/19. No active bleeding, but stigmata of considerable prior bleeding/ecchymoses noted.  - Heparin gtt as above.  - Serial CBC with transfusion threshold 7g/dl  LFT elevation: Stable relatively. Ammonia normal, hepatitis panel negative. RUQ nonspecific. - MRCP per GI today. LFTs stable/improving. Pt is NPO.   IDT2DM: HbA1c 7.8%.  - Continue basal-bolus insulin, DC'ed 70/30 insulin due to hypoglycemia, may not be best option for pt going forward.   Obesity: Estimated body mass index is 32.2 kg/m as calculated from the following:   Height as of this encounter: 6' (1.829 m).   Weight as of this encounter: 107.7 kg.  DVT prophylaxis: Heparin gtt Code Status: Full Family Communication: None at bedside. Disposition Plan:  Status is: Inpatient  Remains inpatient appropriate because: Continued inpatient management.   Consultants:  Nephrology ID Vascular surgery Cardiology  Subjective: Feels terrible, mostly due to not getting up nearly as much as usual. No new bleeding reported. Coughing up some dark sputum intermittently though overall this is slightly improved. Had BM yesterday no blood.   Objective: Vitals:   10/31/21 2123 10/31/21 2303 11/01/21 0500 11/01/21 0532  BP: (!) 114/54 (!) 106/57  105/60  Pulse: Marland Kitchen)  101 93  94  Resp: 17   19  Temp: 98.5 F (36.9 C)   (!) 97.4 F (36.3 C)  TempSrc: Oral   Oral  SpO2:    92%  Weight:   107.7 kg   Height:        Intake/Output Summary (Last 24 hours) at 11/01/2021 1002 Last data filed at 10/31/2021  2130 Gross per 24 hour  Intake 290.1 ml  Output 1937 ml  Net -1646.9 ml   Filed Weights   10/31/21 1356 10/31/21 1830 11/01/21 0500  Weight: 109.9 kg 107.9 kg 107.7 kg    Gen: 60 y.o. male in no distress  Pulm: Non-labored breathing. Clear to auscultation bilaterally.  CV: Regular rate and rhythm. No murmur, rub, or gallop. No JVD, minimal pedal edema. GI: Abdomen soft, non-tender, non-distended, with normoactive bowel sounds. No organomegaly or masses felt. Ext: Warm, RLE BKA stump with scab formation without significant erythema, erythema or fluctuance. Scattered ecchymoses involving prior IV sites on LUE and around RUE AVF without fluctuance. +thrill in RUE.  Skin: No rashes, lesions or ulcers other than as above Neuro: Alert and oriented. No focal neurological deficits. Psych: Judgement and insight appear normal. Mood & affect appropriate.   Data Reviewed: I have personally reviewed following labs and imaging studies  CBC: Recent Labs  Lab 10/25/21 1828 10/26/21 1055 10/28/21 0140 10/29/21 0240 10/30/21 1552 10/31/21 0427  WBC 13.0* 10.8* 16.0* 22.0* 14.3* 13.9*  NEUTROABS 11.4*  --   --   --   --   --   HGB 8.9* 7.1* 7.4* 8.0* 6.7* 7.8*  HCT 29.2* 23.3* 24.4* 27.4* 22.1* 25.0*  MCV 88.8 87.9 88.4 91.6 89.1 88.0  PLT 97* 91* 136* 153 143* 416*   Basic Metabolic Panel: Recent Labs  Lab 10/26/21 1055 10/28/21 0140 10/29/21 0852 10/30/21 1552 10/31/21 0427  NA 130* 131* 130* 133* 131*  K 3.6 4.4 4.2 4.2 4.1  CL 92* 94* 94* 96* 96*  CO2 27 26 25 27 25   GLUCOSE 148* 164* 134* 221* 283*  BUN 36* 44* 54* 31* 41*  CREATININE 3.65* 4.23* 5.34* 3.58* 4.15*  CALCIUM 7.3* 7.8* 7.8* 7.9* 7.6*  PHOS 4.3  --   --   --   --    GFR: Estimated Creatinine Clearance: 23.4 mL/min (A) (by C-G formula based on SCr of 4.15 mg/dL (H)). Liver Function Tests: Recent Labs  Lab 10/25/21 1828 10/28/21 0140 10/29/21 0852 10/30/21 1552 10/31/21 0427  AST 92* 240* 180* 71* 42*   ALT 73* 164* 174* 115* 87*  ALKPHOS 82 93 80 74 65  BILITOT 1.7* 1.4* 1.8* 1.9* 1.9*  PROT 6.5 5.6* 5.5* 5.4* 5.3*  ALBUMIN 2.1* 2.0* 1.8* 1.7* 1.6*   No results for input(s): LIPASE, AMYLASE in the last 168 hours. Recent Labs  Lab 10/29/21 0852 10/30/21 1422  AMMONIA 21 20   Coagulation Profile: Recent Labs  Lab 10/30/21 1629  INR 3.0*   Cardiac Enzymes: No results for input(s): CKTOTAL, CKMB, CKMBINDEX, TROPONINI in the last 168 hours. BNP (last 3 results) No results for input(s): PROBNP in the last 8760 hours. HbA1C: No results for input(s): HGBA1C in the last 72 hours. CBG: Recent Labs  Lab 10/30/21 2131 10/31/21 0743 10/31/21 1148 10/31/21 2123 11/01/21 0740  GLUCAP 270* 274* 284* 246* 328*   Lipid Profile: No results for input(s): CHOL, HDL, LDLCALC, TRIG, CHOLHDL, LDLDIRECT in the last 72 hours. Thyroid Function Tests: No results for input(s): TSH, T4TOTAL, FREET4, T3FREE,  THYROIDAB in the last 72 hours. Anemia Panel: Recent Labs    10/30/21 1629  FERRITIN 2,418*   Urine analysis:    Component Value Date/Time   COLORURINE YELLOW 05/23/2020 2057   APPEARANCEUR CLEAR 05/23/2020 2057   LABSPEC 1.008 05/23/2020 2057   PHURINE 5.0 05/23/2020 2057   GLUCOSEU 50 (A) 05/23/2020 2057   HGBUR LARGE (A) 05/23/2020 2057   BILIRUBINUR NEGATIVE 05/23/2020 2057   BILIRUBINUR neg 10/01/2014 1445   KETONESUR NEGATIVE 05/23/2020 2057   PROTEINUR 100 (A) 05/23/2020 2057   UROBILINOGEN 0.2 10/01/2014 1445   UROBILINOGEN 0.2 09/30/2014 2051   NITRITE NEGATIVE 05/23/2020 2057   LEUKOCYTESUR NEGATIVE 05/23/2020 2057   Recent Results (from the past 240 hour(s))  Resp Panel by RT-PCR (Flu A&B, Covid) Nasopharyngeal Swab     Status: None   Collection Time: 10/25/21  6:32 PM   Specimen: Nasopharyngeal Swab; Nasopharyngeal(NP) swabs in vial transport medium  Result Value Ref Range Status   SARS Coronavirus 2 by RT PCR NEGATIVE NEGATIVE Final    Comment:  (NOTE) SARS-CoV-2 target nucleic acids are NOT DETECTED.  The SARS-CoV-2 RNA is generally detectable in upper respiratory specimens during the acute phase of infection. The lowest concentration of SARS-CoV-2 viral copies this assay can detect is 138 copies/mL. A negative result does not preclude SARS-Cov-2 infection and should not be used as the sole basis for treatment or other patient management decisions. A negative result may occur with  improper specimen collection/handling, submission of specimen other than nasopharyngeal swab, presence of viral mutation(s) within the areas targeted by this assay, and inadequate number of viral copies(<138 copies/mL). A negative result must be combined with clinical observations, patient history, and epidemiological information. The expected result is Negative.  Fact Sheet for Patients:  EntrepreneurPulse.com.au  Fact Sheet for Healthcare Providers:  IncredibleEmployment.be  This test is no t yet approved or cleared by the [place] FDA and  has been authorized for detection and/or diagnosis of SARS-CoV-2 by FDA under an Emergency Use Authorization (EUA). This EUA will remain  in effect (meaning this test can be used) for the duration of the COVID-19 declaration under Section 564(b)(1) of the Act, 21 U.S.C.section 360bbb-3(b)(1), unless the authorization is terminated  or revoked sooner.       Influenza A by PCR NEGATIVE NEGATIVE Final   Influenza B by PCR NEGATIVE NEGATIVE Final    Comment: (NOTE) The Xpert Xpress SARS-CoV-2/FLU/RSV plus assay is intended as an aid in the diagnosis of influenza from Nasopharyngeal swab specimens and should not be used as a sole basis for treatment. Nasal washings and aspirates are unacceptable for Xpert Xpress SARS-CoV-2/FLU/RSV testing.  Fact Sheet for Patients: EntrepreneurPulse.com.au  Fact Sheet for Healthcare  Providers: IncredibleEmployment.be  This test is not yet approved or cleared by the [place] FDA and has been authorized for detection and/or diagnosis of SARS-CoV-2 by FDA under an Emergency Use Authorization (EUA). This EUA will remain in effect (meaning this test can be used) for the duration of the COVID-19 declaration under Section 564(b)(1) of the Act, 21 U.S.C. section 360bbb-3(b)(1), unless the authorization is terminated or revoked.  Performed at [facility], [place] [address] [place], [place] [address]   Culture, blood (Routine X 2) w Reflex to ID Panel     Status: None (Preliminary result)   Collection Time: 10/27/21 10:19 AM   Specimen: Left Antecubital; Blood  Result Value Ref Range Status   Specimen Description LEFT ANTECUBITAL  Final   Special  Requests   Final    BOTTLES DRAWN AEROBIC AND ANAEROBIC Blood Culture adequate volume   Culture   Final    NO GROWTH 4 DAYS Performed at [facility], [place] [address], [place], [place] [address]    Report Status PENDING  Incomplete  Culture, blood (Routine X 2) w Reflex to ID Panel     Status: None (Preliminary result)   Collection Time: 10/27/21 10:36 AM   Specimen: BLOOD LEFT ARM  Result Value Ref Range Status   Specimen Description BLOOD LEFT ARM  Final   Special Requests   Final    BOTTLES DRAWN AEROBIC AND ANAEROBIC Blood Culture adequate volume   Culture   Final    NO GROWTH 4 DAYS Performed at [facility], [place] [address] [place], [place] [address]    Report Status PENDING  Incomplete      Radiology Studies: No results found.  Scheduled Meds:  sodium chloride   Intravenous Once   benzonatate  200 mg Oral TID   calcitRIOL  0.5 mcg Oral Q T,Th,Sa-HD   darbepoetin (ARANESP) injection - DIALYSIS  200 mcg Intravenous Q Thu-HD   diltiazem  120 mg Oral Daily   feeding supplement (NEPRO CARB STEADY)  237 mL Oral BID BM   furosemide  80 mg Oral Once per day on Sun Mon Wed Fri    insulin aspart  0-6 Units Subcutaneous TID WC   insulin glargine-yfgn  18 Units Subcutaneous Daily   lidocaine  1 patch Transdermal Q24H   mouth rinse  15 mL Mouth Rinse BID   metoprolol tartrate  50 mg Oral BID   multivitamin  1 tablet Oral QHS   Continuous Infusions:  sodium chloride 10 mL/hr at 10/27/21 1845   ceFEPime (MAXIPIME) IV 2 g (10/31/21 2028)   heparin     vancomycin Stopped (10/31/21 1910)     LOS: 7 days   Time spent: 25 minutes.  Patrecia Pour, MD [facility] [email or web address] 11/01/2021, 10:02 AM

## 2021-11-01 NOTE — Progress Notes (Addendum)
° °  VASCULAR SURGERY ASSESSMENT & PLAN:   END-STAGE RENAL DISEASE: He was dialyzed via his right upper arm fistula yesterday but had significant bleeding issues with the fistula.  Of note he is on Eliquis which could be part of the problem.  However, on exam he has a significant hematoma in the upper arm and I would favor placing a catheter and letting the fistula rest for now.  He will need to be off his Eliquis before a catheter can be placed.  I have held his Eliquis, and will have the pharmacy cover him with IV heparin.  I will plan on placement of a catheter Friday.  SUBJECTIVE:   Complains of productive cough  PHYSICAL EXAM:   Vitals:   10/31/21 2123 10/31/21 2303 11/01/21 0500 11/01/21 0532  BP: (!) 114/54 (!) 106/57  105/60  Pulse: (!) 101 93  94  Resp: 17   19  Temp: 98.5 F (36.9 C)   (!) 97.4 F (36.3 C)  TempSrc: Oral   Oral  SpO2:    92%  Weight:   107.7 kg   Height:       His fistula has an excellent thrill but there is significant bruising and ecchymosis around the fistula suggesting a previous infiltrate.  LABS:   Lab Results  Component Value Date   WBC 13.9 (H) 10/31/2021   HGB 7.8 (L) 10/31/2021   HCT 25.0 (L) 10/31/2021   MCV 88.0 10/31/2021   PLT 107 (L) 10/31/2021   Lab Results  Component Value Date   CREATININE 4.15 (H) 10/31/2021   Lab Results  Component Value Date   INR 3.0 (H) 10/30/2021   CBG (last 3)  Recent Labs    10/31/21 0743 10/31/21 1148 10/31/21 2123  GLUCAP 274* 284* 246*    PROBLEM LIST:    Principal Problem:   Tricuspid valve vegetation Active Problems:   Diabetes mellitus with end stage renal disease ([place])   PVD (peripheral vascular disease) ([place])   CAD (coronary artery disease), native coronary artery   Pacemaker: Medtronic Advisa DR MRI [id number] Dual chamber pacemaker 10/08/2014   ESRD (end stage renal disease) ([place])   Atrial fibrillation with RVR (HCC)   Thrombocytopenia (HCC)   Hyponatremia   Acute on chronic  diastolic CHF (congestive heart failure) (HCC)   Transaminitis   Pacemaker infection ([place])   CURRENT MEDS:    sodium chloride   Intravenous Once   apixaban  5 mg Oral BID   benzonatate  200 mg Oral TID   calcitRIOL  0.5 mcg Oral Q T,Th,Sa-HD   darbepoetin (ARANESP) injection - DIALYSIS  200 mcg Intravenous Q Thu-HD   diltiazem  120 mg Oral Daily   feeding supplement (NEPRO CARB STEADY)  237 mL Oral BID BM   furosemide  80 mg Oral Once per day on Sun Mon Wed Fri   insulin aspart  0-6 Units Subcutaneous TID WC   insulin glargine-yfgn  18 Units Subcutaneous Daily   lidocaine  1 patch Transdermal Q24H   mouth rinse  15 mL Mouth Rinse BID   metoprolol tartrate  50 mg Oral BID   multivitamin  1 tablet Oral QHS   vancomycin variable dose per unstable renal function (pharmacist dosing)   Does not apply See admin instructions    [name] Office: [phone] 11/01/2021

## 2021-11-01 NOTE — Progress Notes (Signed)
[facility] for heparin Indication: atrial fibrillation  Allergies  Allergen Reactions   Cytoxan [Cyclophosphamide] Other (See Comments)    Pancytopenia   Keflex [Cephalexin] Nausea And Vomiting   Tylenol [Acetaminophen] Other (See Comments)    Makes the patient sweat   Pletal [Cilostazol] Palpitations and Other (See Comments)    "Can hear heart beating loudly".    Patient Measurements: Height: 6' (182.9 cm) Weight: 107.7 kg (237 lb 7 oz) IBW/kg (Calculated) : 77.6 Heparin Dosing Weight: 100kg  Vital Signs: Temp: 97.4 F (36.3 C) (12/21 0532) Temp Source: Oral (12/21 0532) BP: 105/60 (12/21 0532) Pulse Rate: 94 (12/21 0532)  Labs: Recent Labs    10/29/21 0852 10/30/21 1552 10/30/21 1629 10/31/21 0427  HGB  --  6.7*  --  7.8*  HCT  --  22.1*  --  25.0*  PLT  --  143*  --  107*  LABPROT  --   --  30.9*  --   INR  --   --  3.0*  --   CREATININE 5.34* 3.58*  --  4.15*    Estimated Creatinine Clearance: 23.4 mL/min (A) (by C-G formula based on SCr of 4.15 mg/dL (H)).   Medical History: Past Medical History:  Diagnosis Date   Alcohol abuse    Anemia    Arthritis    "patient does not think so."   Atrial fibrillation ([place])    Cardiac syncope 10/07/2014   rapid A fib with 8 sec pauses on converison with syncope- temp pacing wire placed then PPM   Cataract    BILATERAL   Coronary artery disease    Diabetes mellitus    dx---been  awhile   ESRD (end stage renal disease) ([place])    [place]   GERD (gastroesophageal reflux disease)    History of blood transfusion    Hyperlipemia    Hypertension    Osteomyelitis ([place])    right foot   Peripheral vascular disease ([place])    Presence of permanent cardiac pacemaker 10/08/2014   Medtronic    Assessment: 69 yoM admitted with TV endocarditis. Pt on apixaban PTA for hx AFib. Pt with HD fistula bleeding so transitioning to IV heparin in anticipation of dialysis  catheter placement. Last dose of apixaban was 12/20 at 2300 - will utilize aPTT to dose initially.  Goal of Therapy:  Heparin level 0.3-0.7 units/ml aPTT 66-102 seconds Monitor platelets by anticoagulation protocol: Yes   Plan:  Heparin 1400 units/h no bolus at 1100 Check aPTT in 8h  Arrie Senate, PharmD, [place], [facility] Clinical Pharmacist [phone] Please check AMION for all [place] numbers 11/01/2021

## 2021-11-01 NOTE — Progress Notes (Signed)
[place] for heparin Indication: atrial fibrillation  Allergies  Allergen Reactions   Cytoxan [Cyclophosphamide] Other (See Comments)    Pancytopenia   Keflex [Cephalexin] Nausea And Vomiting   Tylenol [Acetaminophen] Other (See Comments)    Makes the patient sweat   Pletal [Cilostazol] Palpitations and Other (See Comments)    "Can hear heart beating loudly".    Patient Measurements: Height: 6' (182.9 cm) Weight: 107.7 kg (237 lb 7 oz) IBW/kg (Calculated) : 77.6 Heparin Dosing Weight: 100kg  Vital Signs: Temp: 98.1 F (36.7 C) (12/21 1108) Temp Source: Oral (12/21 1108) BP: 104/61 (12/21 1108) Pulse Rate: 87 (12/21 1108)  Labs: Recent Labs    10/30/21 1552 10/30/21 1629 10/31/21 0427 11/01/21 1810  HGB 6.7*  --  7.8*  --   HCT 22.1*  --  25.0*  --   PLT 143*  --  107*  --   APTT  --   --   --  41*  LABPROT  --  30.9*  --   --   INR  --  3.0*  --   --   CREATININE 3.58*  --  4.15*  --      Estimated Creatinine Clearance: 23.4 mL/min (A) (by C-G formula based on SCr of 4.15 mg/dL (H)).   Assessment: 3 yoM admitted with TV endocarditis. Patient on apixaban PTA for history of AFib. Pt with HD fistula bleeding so transitioning to IV heparin in anticipation of dialysis catheter placement. Last dose of apixaban was 12/20 at 2300 - will utilize aPTT to dose initially.  aPTT sub-therapeutic at 41 sec.  No issue with heparin infusion nor bleeding per RN.  Goal of Therapy:  Heparin level 0.3-0.7 units/ml aPTT 66-102 seconds Monitor platelets by anticoagulation protocol: Yes   Plan:  Increase heparin gtt to 1700 units/hr  F/U AM labs  Nesta Scaturro D. Mina Marble, PharmD, BCPS, [place] 11/01/2021, 8:48 PM

## 2021-11-01 NOTE — Progress Notes (Addendum)
[facility] Progress Note   Subjective:    Seen and examined patient at bedside. Denies SOB, CP, and N/V. Tolerated yesterday's HD with net UF 1.9L. Seen by VVS d/t difficulty with cannulation, ongoing bleeding, and hematoma. Plan for HD catheter placement on 11/03/21-plan for HD 12/23 after catheter is placed.  Objective Vitals:   11/01/21 0500 11/01/21 0532 11/01/21 1011 11/01/21 1013  BP:  105/60 114/63   Pulse:  94  87  Resp:  19    Temp:  (!) 97.4 F (36.3 C)    TempSrc:  Oral    SpO2:  92%    Weight: 107.7 kg     Height:       Physical Exam General: Well-appearing; NAD Heart: Irregular; No murmurs, gallops, or rubs Lungs: Clear in uppers and laterally; No wheezing, rales, or rhonchi Abdomen: Soft, non-tender, active bowel sounds Extremities: Noted trace edema BL thighs; no edema LLE; mild myoclonic jerking noted BLUE Dialysis Access: R AVF (+) Bruit/Thrill     Filed Weights   10/31/21 1356 10/31/21 1830 11/01/21 0500  Weight: 109.9 kg 107.9 kg 107.7 kg    Intake/Output Summary (Last 24 hours) at 11/01/2021 1045 Last data filed at 10/31/2021 2130 Gross per 24 hour  Intake 290.1 ml  Output 1937 ml  Net -1646.9 ml    Additional Objective Labs: Basic Metabolic Panel: Recent Labs  Lab 10/26/21 1055 10/28/21 0140 10/29/21 0852 10/30/21 1552 10/31/21 0427  NA 130*   < > 130* 133* 131*  K 3.6   < > 4.2 4.2 4.1  CL 92*   < > 94* 96* 96*  CO2 27   < > 25 27 25   GLUCOSE 148*   < > 134* 221* 283*  BUN 36*   < > 54* 31* 41*  CREATININE 3.65*   < > 5.34* 3.58* 4.15*  CALCIUM 7.3*   < > 7.8* 7.9* 7.6*  PHOS 4.3  --   --   --   --    < > = values in this interval not displayed.   Liver Function Tests: Recent Labs  Lab 10/29/21 0852 10/30/21 1552 10/31/21 0427  AST 180* 71* 42*  ALT 174* 115* 87*  ALKPHOS 80 74 65  BILITOT 1.8* 1.9* 1.9*  PROT 5.5* 5.4* 5.3*  ALBUMIN 1.8* 1.7* 1.6*   No results for input(s): LIPASE, AMYLASE in the last  168 hours. CBC: Recent Labs  Lab 10/25/21 1828 10/26/21 1055 10/28/21 0140 10/29/21 0240 10/30/21 1552 10/31/21 0427  WBC 13.0* 10.8* 16.0* 22.0* 14.3* 13.9*  NEUTROABS 11.4*  --   --   --   --   --   HGB 8.9* 7.1* 7.4* 8.0* 6.7* 7.8*  HCT 29.2* 23.3* 24.4* 27.4* 22.1* 25.0*  MCV 88.8 87.9 88.4 91.6 89.1 88.0  PLT 97* 91* 136* 153 143* 107*   Blood Culture    Component Value Date/Time   SDES BLOOD LEFT ARM 10/27/2021 1036   SPECREQUEST  10/27/2021 1036    BOTTLES DRAWN AEROBIC AND ANAEROBIC Blood Culture adequate volume   CULT  10/27/2021 1036    NO GROWTH 4 DAYS Performed at [facility], [place] [address], [place], [place] [address]    REPTSTATUS PENDING 10/27/2021 1036    Cardiac Enzymes: No results for input(s): CKTOTAL, CKMB, CKMBINDEX, TROPONINI in the last 168 hours. CBG: Recent Labs  Lab 10/30/21 2131 10/31/21 0743 10/31/21 1148 10/31/21 2123 11/01/21 0740  GLUCAP 270* 274* 284* 246* 328*  Iron Studies:  Recent Labs    10/30/21 1629  FERRITIN 2,418*   Lab Results  Component Value Date   INR 3.0 (H) 10/30/2021   INR 1.2 07/22/2020   INR 1.2 05/26/2020   Studies/Results: No results found.  Medications:  sodium chloride 10 mL/hr at 10/27/21 1845   ceFEPime (MAXIPIME) IV 2 g (10/31/21 2028)   heparin 1,400 Units/hr (11/01/21 1032)   vancomycin Stopped (10/31/21 1910)    sodium chloride   Intravenous Once   benzonatate  200 mg Oral TID   calcitRIOL  0.5 mcg Oral Q T,Th,Sa-HD   darbepoetin (ARANESP) injection - DIALYSIS  200 mcg Intravenous Q Thu-HD   diltiazem  120 mg Oral Daily   feeding supplement (NEPRO CARB STEADY)  237 mL Oral BID BM   furosemide  80 mg Oral Once per day on Sun Mon Wed Fri   insulin aspart  0-6 Units Subcutaneous TID WC   insulin glargine-yfgn  18 Units Subcutaneous Daily   lidocaine  1 patch Transdermal Q24H   mouth rinse  15 mL Mouth Rinse BID   metoprolol tartrate  50 mg Oral BID   multivitamin  1 tablet Oral  QHS    Dialysis Orders: GO TTS  4h 54min  108.5kg   400/800   2/2.5 bath  TDC   AVF RUA Hep none  - mircera 225 last 12/1 - calcitriol 0.5 ug tiw  Assessment/Plan: PNA - w/ cough, mild SOB, RLL infiltrate on CXR.  On antibiotics.  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 Spoke with Pharmacy-plan for patient to receive Vancomycin 1g and Cefepime 2g with HD (TTS) until 12/08/21. Repeat blood cultures (-) to date.  Cardiology following  ESRD - on HD TTS. Difficulty cannulating AVF previously. Re-attempt dialysis yesterday-tolerated yesterday's HD with net UF 2L. Plan for HD 11/02/21-please see below for vascular access plan. Vascular Access- HD staff having extreme difficulty cannulating patient during yesterday's HD; noted bleeding and clots during yesterday's cannulation. Also with hematoma RUE. Consulted VVS-seen by Dr. Scot Dock. Eliquis held-start on Heparin infusion; plan to rest R AVF and placement of catheter on 11/03/21-will schedule for HD 2nd shift on 11/03/21. Myoclonic Jerking- Ongoing; Discussed with Dr. Doreatha Massed has been ongoing for few months prior to admission. Doesn't appear to be d/t uremia. Currently not on Gabapentin. LFTs elevated. Current Ammonia level WNL. He will need outpatient neurologic workup. Volume 1+ edema bilateral thighs and LLE-probably losing body wt (hx poor appetite for 1-2 mos). Will have lower dry weight at discharge.  HTN - BP trend soft but stable currently. Takes only metropolol, which may be for afib Afib/ RVR - Rate controlled, on BB, Cardizem, Eliquis per cardiology  Echo 12/15 -- EF 45%,  Large tricuspid valve veg , severe TVR - not a good candidate for surgery per Dr. [name]  DM2 - on insulin Anemia ckd - S/p 1 unit PRBCs for Hgb 6.7 yesterday afternoon. Hgb now 7.8. Next dose due 12/22. Will check CBC in AM. Transfuse prn.  MBD ckd - Corr Ca/Phos ok.  cont  vdra  Tobie Poet, NP [facility] 11/01/2021,10:45 AM  LOS: 7 days    I have seen and examined this patient and agree with plan and assessment in the above note with renal recommendations/intervention highlighted.  Broadus John A Jasdeep Dejarnett,MD 11/01/2021 12:22 PM

## 2021-11-02 ENCOUNTER — Telehealth: Payer: Self-pay

## 2021-11-02 DIAGNOSIS — E440 Moderate protein-calorie malnutrition: Secondary | ICD-10-CM | POA: Insufficient documentation

## 2021-11-02 DIAGNOSIS — K769 Liver disease, unspecified: Secondary | ICD-10-CM

## 2021-11-02 DIAGNOSIS — R7989 Other specified abnormal findings of blood chemistry: Secondary | ICD-10-CM

## 2021-11-02 LAB — RENAL FUNCTION PANEL
Albumin: 1.6 g/dL — ABNORMAL LOW (ref 3.5–5.0)
Anion gap: 12 (ref 5–15)
BUN: 36 mg/dL — ABNORMAL HIGH (ref 8–23)
CO2: 23 mmol/L (ref 22–32)
Calcium: 7.5 mg/dL — ABNORMAL LOW (ref 8.9–10.3)
Chloride: 94 mmol/L — ABNORMAL LOW (ref 98–111)
Creatinine, Ser: 4.12 mg/dL — ABNORMAL HIGH (ref 0.61–1.24)
GFR, Estimated: 16 mL/min — ABNORMAL LOW (ref >60)
Glucose, Bld: 241 mg/dL — ABNORMAL HIGH (ref 70–99)
Phosphorus: 4.7 mg/dL — ABNORMAL HIGH (ref 2.5–4.6)
Potassium: 3.6 mmol/L (ref 3.5–5.1)
Sodium: 129 mmol/L — ABNORMAL LOW (ref 135–145)

## 2021-11-02 LAB — APTT: aPTT: 70 seconds — ABNORMAL HIGH (ref 24–36)

## 2021-11-02 LAB — GLUCOSE, CAPILLARY
Glucose-Capillary: 259 mg/dL — ABNORMAL HIGH (ref 70–99)
Glucose-Capillary: 231 mg/dL — ABNORMAL HIGH (ref 70–99)
Glucose-Capillary: 210 mg/dL — ABNORMAL HIGH (ref 70–99)
Glucose-Capillary: 168 mg/dL — ABNORMAL HIGH (ref 70–99)

## 2021-11-02 LAB — HEPARIN LEVEL (UNFRACTIONATED): Heparin Unfractionated: >1.1 IU/mL — ABNORMAL HIGH (ref 0.30–0.70)

## 2021-11-02 LAB — CBC
HCT: 23.5 % — ABNORMAL LOW (ref 39.0–52.0)
Hemoglobin: 7.1 g/dL — ABNORMAL LOW (ref 13.0–17.0)
MCH: 27.4 pg (ref 26.0–34.0)
MCHC: 30.2 g/dL (ref 30.0–36.0)
MCV: 90.7 fL (ref 80.0–100.0)
Platelets: 89 10*3/uL — ABNORMAL LOW (ref 150–400)
RBC: 2.59 MIL/uL — ABNORMAL LOW (ref 4.22–5.81)
RDW: 22.4 % — ABNORMAL HIGH (ref 11.5–15.5)
WBC: 11.1 10*3/uL — ABNORMAL HIGH (ref 4.0–10.5)
nRBC: 0.4 % — ABNORMAL HIGH (ref 0.0–0.2)

## 2021-11-02 MED ORDER — LIDOCAINE-PRILOCAINE 2.5-2.5 % EX CREA
1.0000 "application " | TOPICAL_CREAM | CUTANEOUS | Status: DC | PRN
  Filled 2021-11-02: qty 5

## 2021-11-02 MED ORDER — INSULIN ASPART 100 UNIT/ML IJ SOLN: 0.0000 [IU] | Freq: Every day | INTRAMUSCULAR | Status: DC

## 2021-11-02 MED ORDER — SODIUM CHLORIDE 0.9 % IV SOLN: 100.0000 mL | INTRAVENOUS | Status: DC | PRN

## 2021-11-02 MED ORDER — ALTEPLASE 2 MG IJ SOLR
2.0000 mg | Freq: Once | INTRAMUSCULAR | Status: DC | PRN
  Filled 2021-11-02: qty 2

## 2021-11-02 MED ORDER — VANCOMYCIN VARIABLE DOSE PER UNSTABLE RENAL FUNCTION (PHARMACIST DOSING): Status: DC

## 2021-11-02 MED ORDER — PENTAFLUOROPROP-TETRAFLUOROETH EX AERO: 1.0000 "application " | INHALATION_SPRAY | CUTANEOUS | Status: DC | PRN

## 2021-11-02 MED ORDER — FUROSEMIDE 40 MG PO TABS
80.0000 mg | ORAL_TABLET | ORAL | Status: DC
  Administered 2021-11-05 – 2021-11-06 (×2): 80 mg via ORAL
  Filled 2021-11-02 (×2): qty 2

## 2021-11-02 MED ORDER — HEPARIN SODIUM (PORCINE) 1000 UNIT/ML DIALYSIS
1000.0000 [IU] | INTRAMUSCULAR | Status: DC | PRN
  Filled 2021-11-02 (×3): qty 1

## 2021-11-02 MED ORDER — DARBEPOETIN ALFA 200 MCG/0.4ML IJ SOSY
200.0000 ug | PREFILLED_SYRINGE | INTRAMUSCULAR | Status: DC
  Administered 2021-11-03: 200 ug via INTRAVENOUS
  Filled 2021-11-02 (×2): qty 0.4

## 2021-11-02 MED ORDER — LIDOCAINE HCL (PF) 1 % IJ SOLN: 5.0000 mL | INTRAMUSCULAR | Status: DC | PRN

## 2021-11-02 MED ORDER — CHLORHEXIDINE GLUCONATE CLOTH 2 % EX PADS: 6.0000 | MEDICATED_PAD | Freq: Every day | CUTANEOUS | Status: DC

## 2021-11-02 MED ORDER — HEPARIN (PORCINE) 25000 UT/250ML-% IV SOLN
1700.0000 [IU]/h | INTRAVENOUS | Status: DC
Start: 2021-11-02 — End: 2021-11-03
  Administered 2021-11-02: 17:00:00 1700 [IU]/h via INTRAVENOUS
  Filled 2021-11-02: qty 250

## 2021-11-02 MED ORDER — SODIUM CHLORIDE 0.9 % IV SOLN
1.0000 g | INTRAVENOUS | Status: DC
  Administered 2021-11-02 – 2021-11-06 (×4): 1 g via INTRAVENOUS
  Filled 2021-11-02 (×5): qty 1

## 2021-11-02 MED ORDER — INSULIN ASPART 100 UNIT/ML IJ SOLN
0.0000 [IU] | Freq: Three times a day (TID) | INTRAMUSCULAR | Status: DC
  Administered 2021-11-03: 10:00:00 2 [IU] via SUBCUTANEOUS
  Administered 2021-11-03: 13:00:00 3 [IU] via SUBCUTANEOUS
  Administered 2021-11-03: 18:00:00 1 [IU] via SUBCUTANEOUS
  Administered 2021-11-04: 08:00:00 5 [IU] via SUBCUTANEOUS
  Administered 2021-11-04: 20:00:00 1 [IU] via SUBCUTANEOUS
  Administered 2021-11-04: 12:00:00 2 [IU] via SUBCUTANEOUS
  Administered 2021-11-05: 09:00:00 5 [IU] via SUBCUTANEOUS
  Administered 2021-11-05: 12:00:00 3 [IU] via SUBCUTANEOUS
  Administered 2021-11-06 (×2): 2 [IU] via SUBCUTANEOUS

## 2021-11-02 NOTE — TOC Initial Note (Signed)
Transition of Care [facility]) - Initial/Assessment Note    Patient Details  Name: Alexander [name] MRN: [id number] Date of Birth: [birth year]  [facility] CM/SW Contact:    Bethann Berkshire, [place] Phone Number: 11/02/2021, 11:02 AM  Clinical Narrative:                  CSW met with pt to discuss SNF. Pt quickly explains he does not want SNF. He states he does not want home health either. Pt reports living at home with his wife and 2 adult children. He explains they help him and someone is there 24/7. He has a ramp and a wheelchair at home. Pt gets HD at [address] and his family transports him there. Pt reports he can get around well with his prosthetic and just needs new padding for it which he will be able to get from his regular prosthetic place. Please reach out to [facility] if there are any additional needs or if pt changes his mind regarding SNF and HH.    Expected Discharge Plan: Home/Self Care Barriers to Discharge: Continued Medical Work up   Patient Goals and CMS Choice        Expected Discharge Plan and Services Expected Discharge Plan: Home/Self Care In-house Referral: NA Discharge Planning Services: CM Consult   Living arrangements for the past 2 months: Single Family Home                                      Prior Living Arrangements/Services Living arrangements for the past 2 months: Single Family Home Lives with:: Spouse Patient language and need for interpreter reviewed:: Yes Do you feel safe going back to the place where you live?: Yes      Need for Family Participation in Patient Care: Yes (Comment) Care giver support system in place?: Yes (comment)   Criminal Activity/Legal Involvement Pertinent to Current Situation/Hospitalization: No - Comment as needed  Activities of Daily Living Home Assistive Devices/Equipment: Prosthesis, Wheelchair, Environmental consultant (specify type), Crutches ADL Screening (condition at time of admission) Patient's cognitive ability  adequate to safely complete daily activities?: No Is the patient deaf or have difficulty hearing?: No Does the patient have difficulty seeing, even when wearing glasses/contacts?: No Does the patient have difficulty concentrating, remembering, or making decisions?: No Patient able to express need for assistance with ADLs?: Yes Does the patient have difficulty dressing or bathing?: Yes Independently performs ADLs?: No Communication: Independent Dressing (OT): Needs assistance Is this a change from baseline?: Pre-admission baseline Grooming: Independent Feeding: Independent Bathing: Needs assistance Is this a change from baseline?: Pre-admission baseline Toileting: Needs assistance Is this a change from baseline?: Pre-admission baseline In/Out Bed: Needs assistance Is this a change from baseline?: Pre-admission baseline Walks in Home: Independent with device (comment) (prosthesis) Does the patient have difficulty walking or climbing stairs?: Yes Weakness of Legs: Both Weakness of Arms/Hands: Both  Permission Sought/Granted Permission sought to share information with : Family Supports, Case Manager                Emotional Assessment Appearance:: Appears stated age       Alcohol / Substance Use: Not Applicable Psych Involvement: No (comment)  Admission diagnosis:  Atrial fibrillation with RVR ([place]) [I48.91] Community acquired pneumonia of right lung, unspecified part of lung [J18.9] Patient Active Problem List   Diagnosis Date Noted   Pacemaker infection ([place])  Transaminitis    Tricuspid valve vegetation    Thrombocytopenia (HCC) 10/26/2021   Hyponatremia 10/26/2021   Acute on chronic diastolic CHF (congestive heart failure) ([place]) 10/26/2021   Atrial fibrillation with RVR ([place]) 10/25/2021   Symptomatic anemia 09/30/2021   ESRD (end stage renal disease) ([place]) 09/30/2021   PAF (paroxysmal atrial fibrillation) ([place]) 09/30/2021   NSVT (nonsustained ventricular  tachycardia)    Pancytopenia ([place])    Acute blood loss anemia 07/18/2020   Hemodialysis patient ([place]) 06/04/2020   Necrotizing glomerulonephritis 06/04/2020   Heme positive stool    Melena    Upper GI bleeding 05/23/2020   Acute GI bleeding 05/23/2020   Acute hematogenous osteomyelitis of right foot ([place]) 03/11/2020   Gangrene of right foot ([place])    Diabetic foot ulcer associated with type 2 diabetes mellitus ([place]) 02/19/2020   Heel ulcer ([place]) 11/05/2019   Atherosclerotic PVD with ulceration ([place]) 09/09/2017   Essential hypertension, benign 02/07/2015   Renal insufficiency 02/07/2015   Post PTCA 11/09/2014   S/P PTCA (percutaneous transluminal coronary angioplasty) 11/09/2014   Pacemaker: [place] DR MRI [id number] Dual chamber pacemaker 10/08/2014 10/08/2014   Encounter for care of pacemaker 10/08/2014   Sinus node dysfunction (HCC) 10/08/2014   Cardiac asystole ([place]) 10/07/2014   Cardiac syncope 10/07/2014   Diabetes mellitus with stage 3 chronic kidney disease ([place]) 10/07/2014   CAD (coronary artery disease), native coronary artery 10/07/2014   Diabetes mellitus due to underlying condition without complications ([place]) 67/59/1638   Anemia, unspecified 05/25/2014   Syncope 04/30/2014   Atherosclerosis of native arteries of the extremities with gangrene ([place]) 12/16/2013   Volume overload 10/30/2013   Acute renal failure ([place]) 10/28/2013   PVD (peripheral vascular disease) ([place]) 10/27/2013   Leukocytosis 10/21/2013   Anemia 10/21/2013   Osteomyelitis of left foot ([place]) 08/19/2013   Spinal stenosis, lumbar region, with neurogenic claudication 06/12/2012    Class: Diagnosis of   Diabetes mellitus with end stage renal disease ([place]) 03/15/2009   ALCOHOL ABUSE 03/15/2009   TOBACCO USE 03/15/2009   HYPERTENSION 03/15/2009   PCP:  [name], MD Pharmacy:   [place], [place]  [place] [address] Phone: [phone] Fax: [phone]     Social Determinants of Health (SDOH) Interventions    Readmission Risk Interventions Readmission Risk Prevention Plan 10/27/2021 05/26/2020 03/15/2020  Transportation Screening Complete Complete Complete  PCP or Specialist Appt within 5-7 Days - - Complete  PCP or Specialist Appt within 3-5 Days Complete - -  Home Care Screening - - Complete  Medication Review (RN CM) - - Complete  HRI or Home Care Consult Complete - -  Social Work Consult for [place] Planning/Counseling Complete - -  Palliative Care Screening Not Applicable - -  Medication Review Press photographer) Complete Complete -  PCP or Specialist appointment within 3-5 days of discharge - Complete -  [place] or [place] - Complete -  SW Recovery Care/Counseling Consult - Complete -  Palliative Care Screening - Not Applicable -  [employer] - Not Applicable -  Some recent data might be hidden

## 2021-11-02 NOTE — Progress Notes (Addendum)
[facility] Progress Note   Subjective:    Seen and examined patient at bedside. Appears comfortable. Denies SOB, CP, and N/V. Seen by VVS d/t poorly functioning RUE AVF. Plan for HD catheter placement on 11/03/21 by Dr. Scot Dock (this is scheduled for 1st case). Plan for HD 11/03/21 2nd shift after HD catheter placement.   Objective Vitals:   11/01/21 2100 11/02/21 0419 11/02/21 0428 11/02/21 0841  BP: (!) 105/57 98/63  116/60  Pulse: 88 83  92  Resp: 19 17    Temp: 97.8 F (36.6 C) 97.8 F (36.6 C)    TempSrc: Oral Oral    SpO2: 98% 98%    Weight:   109.9 kg   Height:       Physical Exam General: Well-appearing; NAD Heart: Irregular; No murmurs, gallops, or rubs Lungs: Clear in uppers and laterally; No wheezing, rales, or rhonchi Abdomen: Soft, non-tender, active bowel sounds Extremities: Noted edema BL thighs and LLE; no myoclonic jerking noted Dialysis Access: R AVF (+) Bruit/Thrill but very bruised; noted hematoma  Filed Weights   10/31/21 1830 11/01/21 0500 11/02/21 0428  Weight: 107.9 kg 107.7 kg 109.9 kg    Intake/Output Summary (Last 24 hours) at 11/02/2021 1015 Last data filed at 11/02/2021 0600 Gross per 24 hour  Intake 360 ml  Output --  Net 360 ml    Additional Objective Labs: Basic Metabolic Panel: Recent Labs  Lab 10/26/21 1055 10/28/21 0140 10/30/21 1552 10/31/21 0427 11/02/21 0356  NA 130*   < > 133* 131* 129*  K 3.6   < > 4.2 4.1 3.6  CL 92*   < > 96* 96* 94*  CO2 27   < > 27 25 23   GLUCOSE 148*   < > 221* 283* 241*  BUN 36*   < > 31* 41* 36*  CREATININE 3.65*   < > 3.58* 4.15* 4.12*  CALCIUM 7.3*   < > 7.9* 7.6* 7.5*  PHOS 4.3  --   --   --  4.7*   < > = values in this interval not displayed.   Liver Function Tests: Recent Labs  Lab 10/29/21 0852 10/30/21 1552 10/31/21 0427 11/02/21 0356  AST 180* 71* 42*  --   ALT 174* 115* 87*  --   ALKPHOS 80 74 65  --   BILITOT 1.8* 1.9* 1.9*  --   PROT 5.5* 5.4* 5.3*  --    ALBUMIN 1.8* 1.7* 1.6* 1.6*   No results for input(s): LIPASE, AMYLASE in the last 168 hours. CBC: Recent Labs  Lab 10/28/21 0140 10/29/21 0240 10/30/21 1552 10/31/21 0427 11/02/21 0356  WBC 16.0* 22.0* 14.3* 13.9* 11.1*  HGB 7.4* 8.0* 6.7* 7.8* 7.1*  HCT 24.4* 27.4* 22.1* 25.0* 23.5*  MCV 88.4 91.6 89.1 88.0 90.7  PLT 136* 153 143* 107* 89*   Blood Culture    Component Value Date/Time   SDES BLOOD LEFT ARM 10/27/2021 1036   SPECREQUEST  10/27/2021 1036    BOTTLES DRAWN AEROBIC AND ANAEROBIC Blood Culture adequate volume   CULT  10/27/2021 1036    NO GROWTH 5 DAYS Performed at [facility], [place] [address], [place], [place] [address]    REPTSTATUS 11/01/2021 FINAL 10/27/2021 1036    Cardiac Enzymes: No results for input(s): CKTOTAL, CKMB, CKMBINDEX, TROPONINI in the last 168 hours. CBG: Recent Labs  Lab 11/01/21 0740 11/01/21 1112 11/01/21 1621 11/01/21 2210 11/02/21 0751  GLUCAP 328* 287* 259* 268* 259*   Iron Studies:  Recent Labs    10/30/21 1629 11/01/21 1810  IRON  --  30*  TIBC  --  NOT CALCULATED  FERRITIN 2,418*  --    Lab Results  Component Value Date   INR 3.0 (H) 10/30/2021   INR 1.2 07/22/2020   INR 1.2 05/26/2020   Studies/Results: MR ABDOMEN MRCP WO CONTRAST  Result Date: 11/01/2021 CLINICAL DATA:  Right upper quadrant pain EXAM: MRI ABDOMEN WITHOUT CONTRAST  (INCLUDING MRCP) TECHNIQUE: Multiplanar multisequence MR imaging of the abdomen was performed. Heavily T2-weighted images of the biliary and pancreatic ducts were obtained, and three-dimensional MRCP images were rendered by post processing. COMPARISON:  CT abdomen and pelvis 09/16/2021, abdominal ultrasound 10/29/2021 FINDINGS: Study is limited due to motion. Lower chest: Small right pleural effusion. Extensive irregular signal abnormalities in the visualized right lower lobe. Hepatobiliary: Liver is normal in size in contour with no suspicious mass identified. Hepatic parenchyma  demonstrates diffuse T2 hypointensity as well as relatively increased signal intensity on out of phase T1, consistent with hemosiderosis. Gallbladder is moderately distended with no significant wall thickening or pericholecystic edema visualized. No cholelithiasis identified. No biliary ductal dilatation or definite choledocholithiasis, given limitations of motion. Common bile duct measures up to 6 mm in diameter. Pancreas: Appears grossly normal with no definite mass or ductal dilatation. Limited visualization. Spleen: Upper normal size. Diffuse hypointense T2 signal also consistent with hemosiderosis. Adrenals/Urinary Tract: Adrenal glands appear normal. A few tiny hyperintense T2 signal likely cysts in the right kidney. No hydronephrosis visualized. Stomach/Bowel: Visualized portions within the abdomen are unremarkable. Vascular/Lymphatic: No pathologically enlarged lymph nodes identified. No abdominal aortic aneurysm demonstrated. Other:  No ascites identified. Musculoskeletal: No suspicious bony lesions identified. IMPRESSION: 1. No biliary duct dilatation or definite choledocholithiasis identified. Limited study due to motion. 2. Extensive irregular signal abnormalities in the right lower lobe suggesting pneumonia and/or atelectasis. Small right pleural effusion. 3. 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 Electronically Signed   By: Ofilia Neas M.D.   On: 11/01/2021 15:05    Medications:  sodium chloride 10 mL/hr at 10/27/21 1845   ceFEPime (MAXIPIME) IV     heparin      sodium chloride   Intravenous Once   benzonatate  200 mg Oral TID   calcitRIOL  0.5 mcg Oral Q T,Th,Sa-HD   darbepoetin (ARANESP) injection - DIALYSIS  200 mcg Intravenous Q Thu-HD   diltiazem  120 mg Oral Daily   feeding supplement (NEPRO CARB STEADY)  237 mL Oral BID BM   [START ON 11/05/2021] furosemide  80 mg Oral Once per day on Sun Mon Wed Fri   insulin aspart  0-6 Units Subcutaneous TID WC    insulin glargine-yfgn  18 Units Subcutaneous Daily   lidocaine  1 patch Transdermal Q24H   mouth rinse  15 mL Mouth Rinse BID   metoprolol tartrate  50 mg Oral BID   multivitamin  1 tablet Oral QHS   vancomycin variable dose per unstable renal function (pharmacist dosing)   Does not apply See admin instructions    Dialysis Orders: GO TTS  4h 1min  108.5kg   400/800   2/2.5 bath  TDC   AVF RUA Hep none  - mircera 225 last 12/1 - calcitriol 0.5 ug tiw   Assessment/Plan: PNA - w/ cough, mild SOB, RLL infiltrate on CXR.  On antibiotics.  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 Spoke with Pharmacy-plan  for patient to receive Vancomycin 1g and Cefepime 2g with HD (TTS) until 12/08/21. Repeat blood cultures (-) to date.  Cardiology following  ESRD - on HD TTS. Currently off schedule. Plan for HD 11/03/21 2nd shift-please see below for vascular access plan. Vascular Access- Poorly functioning RUE AVF. Plan for HD placement on 11/03/21 by Dr. Scot Dock first case then HD afterwards. Myoclonic Jerking- Ongoing. This has been ongoing for few months prior to admission. Doesn't appear to be d/t uremia. Currently not on Gabapentin. LFTs elevated. Current Ammonia level WNL. He will need outpatient neurologic workup. Volume 1+ edema bilateral thighs and LLE-probably losing body wt (hx poor appetite for 1-2 mos). Will have lower dry weight at discharge. Will push UF here. HTN - BP trend soft but stable currently. Takes only metropolol, which may be for afib Afib/ RVR - Rate controlled, on BB, Cardizem, Eliquis per cardiology  Echo 12/15 -- EF 45%,  Large tricuspid valve veg , severe TVR - not a good candidate for surgery per Dr. [name]  DM2 - on insulin Anemia ckd - Hgb now 7.1-more likely will need blood transfusion. S/p 1 unit PRBCs 12/19. Will re-dose ESA to receive with HD tomorrow. On Heparin gtt. Will check CBC in AM.  MBD  ckd - Corr Ca/Phos ok.  cont vdra  Alexander Poet, NP [facility] 11/02/2021,10:15 AM  LOS: 8 days    I have seen and examined this patient and agree with plan and assessment in the above note with renal recommendations/intervention highlighted.  For HD catheter tomorrow and HD afterwards.  Broadus John A Syvilla Martin,MD 11/02/2021 3:00 PM

## 2021-11-02 NOTE — Progress Notes (Signed)
PROGRESS NOTE  Alexander [name]  VZC:[id number] DOB: 01/11/1961 DOA: 10/25/2021 PCP: [name], MD   Brief Narrative: Patient is a 60 year old male with ESRD on HD, TTS, paroxysmal A. fib on Eliquis, pacemaker, CAD status post stent, diastolic CHF, PVD with right BKA, prior history of GI bleed, diabetes mellitus type 2 presented with increasing shortness of breath and cough.  Per patient he was diagnosed with pneumonia in early November and completed course of antibiotics.  However he has not felt back to baseline since.  Has had persistent shortness of breath and coughing, fatigue.  Also noted some anterior and right lateral sided rib pain worse with coughing and worsening left lower extremity edema.  He did complete his HD last session prior to admission. In ED, patient was hypoxic down to 80%, was put on 2 L BNP 1000 181. Chest x-ray showed right basilar pneumonia, he was placed on IV Rocephin and doxycycline.   Assessment & Plan: Principal Problem:   Tricuspid valve vegetation Active Problems:   Diabetes mellitus with end stage renal disease (HCC)   PVD (peripheral vascular disease) (HCC)   CAD (coronary artery disease), native coronary artery   Pacemaker: Medtronic Advisa DR MRI [id number] Dual chamber pacemaker 10/08/2014   ESRD (end stage renal disease) ([place])   Atrial fibrillation with RVR (HCC)   Thrombocytopenia (HCC)   Hyponatremia   Acute on chronic diastolic CHF (congestive heart failure) (HCC)   Transaminitis   Pacemaker infection ([place])   Malnutrition of moderate degree  Acute hypoxic respiratory failure: Due to acute on chronic HFpEF, RLL CAP, PAF with RVR:  - Continue abx, antitussives, pulmonary hygiene  - Wean oxygen as tolerated - Continue abx as below which will treat CAP.  - Incentive spirometry, flutter valve, OOB - May need repeat imaging after acute phase.   ESRD:  - Continue HD per nephrology, next planned is 12/23 after HD catheter placement. Planning HD  catheter insertion Friday after eliquis washout per Dr. Scot Dock, VVS. Heparin gtt.  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 No discitis or abscess on CT.  - Risk of worsening infection/embolization with pacemaker removal/reimplantation in setting of active infection is prohibitive, so plan is to treat with IV abx per ID (vancomycin and cefepime after HD thru 12/08/2021) with subsequent transition to oral agent(s). Has [facility] follow up 1/12 with Dr. Gale Journey.   Acute blood loss anemia: Required 1u PRBCs 12/19. No active bleeding, but stigmata of considerable prior bleeding/ecchymoses noted.  - Heparin gtt as above.  - Serial CBC with transfusion threshold 7g/dl - Transferrin noted to be undetectably low, Ferritin is 2,418 acting as acute phase reactant.  Permanent atrial fibrillation, sinus node dysfunction s/p PPM:  - Anticoagulation as above - Rate is controlled/paced. Continue metoprolol  LFT elevation: Suspected cardiogenic cirrhosis on baseline alcohol use per GI who signed off 12/21. Stable relatively. Ammonia normal, hepatitis panel negative. Motion-degraded MRCP showed no biliary ductal dilatation or choledocholithiasis. There was mention of hemosiderosis involving the liver and spleen. - Consider liver biopsy after pt is more clinically stable/infection treatment completed. HFE gene mutation testing could also be pursued.  - Monitor CMP intermittently.  IDT2DM: HbA1c 7.8%.  - DC'ed 70/30 insulin due to hypoglycemia, may not be best option for pt going forward. Augment sliding scale to sensitive given persistent elevations. Continue 18u glargine.   Moderate protein calorie malnutrition: Hypoalbuminemia - Supplement protein as  able.   PAD: s/p RLE bypass prior to BKA, left fem-TP trunk bypass, 2-stage basilic vein AVF.  - Vascular surgery involved in care as above. No critical limb  ischemia currently. - Continue anticoagulation, once LFTs stable, restart statin  Obesity: Estimated body mass index is 32.86 kg/m as calculated from the following:   Height as of this encounter: 6' (1.829 m).   Weight as of this encounter: 109.9 kg.  DVT prophylaxis: Heparin gtt Code Status: Full Family Communication: None at bedside. Called Spouse, spoke with Daughter by phone this evening per pt's expressed wishes. Disposition Plan:  Status is: Inpatient  Remains inpatient appropriate because: Continued inpatient management.   Consultants:  Nephrology ID Vascular surgery Cardiology  Subjective: Still feeling terrible, requiring more assistance for mobility than is his baseline, but declines both SNF (recommended by PT) and home health.  Objective: Vitals:   11/01/21 2100 11/02/21 0419 11/02/21 0428 11/02/21 0841  BP: (!) 105/57 98/63  116/60  Pulse: 88 83  92  Resp: 19 17    Temp: 97.8 F (36.6 C) 97.8 F (36.6 C)    TempSrc: Oral Oral    SpO2: 98% 98%    Weight:   109.9 kg   Height:        Intake/Output Summary (Last 24 hours) at 11/02/2021 1756 Last data filed at 11/02/2021 1500 Gross per 24 hour  Intake 1424.88 ml  Output --  Net 1424.88 ml   Filed Weights   10/31/21 1830 11/01/21 0500 11/02/21 0428  Weight: 107.9 kg 107.7 kg 109.9 kg   Gen: 60 y.o. male in no distress Pulm: Nonlabored breathing room air. Clear anterolaterally. CV: Irreg irreg. No murmur, rub, or gallop. No JVD, no pitting dependent edema. GI: Abdomen soft, non-tender, non-distended, with normoactive bowel sounds.  Ext: Warm, dry. Right BKA, left transmet amputations. Scabbing on right residual stump site without erythema or exudate. right Dupuytren's contracture.  Skin: No other new rashes, lesions or ulcers on visualized skin. +thrill to RUE AVF, ecchymoses stable. Neuro: Alert and oriented. No focal neurological deficits. Psych: Judgement and insight appear fair. Mood euthymic &  affect congruent. Behavior is appropriate.    Data Reviewed: I have personally reviewed following labs and imaging studies  CBC: Recent Labs  Lab 10/28/21 0140 10/29/21 0240 10/30/21 1552 10/31/21 0427 11/02/21 0356  WBC 16.0* 22.0* 14.3* 13.9* 11.1*  HGB 7.4* 8.0* 6.7* 7.8* 7.1*  HCT 24.4* 27.4* 22.1* 25.0* 23.5*  MCV 88.4 91.6 89.1 88.0 90.7  PLT 136* 153 143* 107* 89*   Basic Metabolic Panel: Recent Labs  Lab 10/28/21 0140 10/29/21 0852 10/30/21 1552 10/31/21 0427 11/02/21 0356  NA 131* 130* 133* 131* 129*  K 4.4 4.2 4.2 4.1 3.6  CL 94* 94* 96* 96* 94*  CO2 26 25 27 25 23   GLUCOSE 164* 134* 221* 283* 241*  BUN 44* 54* 31* 41* 36*  CREATININE 4.23* 5.34* 3.58* 4.15* 4.12*  CALCIUM 7.8* 7.8* 7.9* 7.6* 7.5*  PHOS  --   --   --   --  4.7*   GFR: Estimated Creatinine Clearance: 23.8 mL/min (A) (by C-G formula based on SCr of 4.12 mg/dL (H)). Liver Function Tests: Recent Labs  Lab 10/28/21 0140 10/29/21 0852 10/30/21 1552 10/31/21 0427 11/02/21 0356  AST 240* 180* 71* 42*  --   ALT 164* 174* 115* 87*  --   ALKPHOS 93 80 74 65  --   BILITOT 1.4* 1.8* 1.9* 1.9*  --   PROT 5.6*  5.5* 5.4* 5.3*  --   ALBUMIN 2.0* 1.8* 1.7* 1.6* 1.6*   No results for input(s): LIPASE, AMYLASE in the last 168 hours. Recent Labs  Lab 10/29/21 0852 10/30/21 1422  AMMONIA 21 20   Coagulation Profile: Recent Labs  Lab 10/30/21 1629  INR 3.0*   Cardiac Enzymes: No results for input(s): CKTOTAL, CKMB, CKMBINDEX, TROPONINI in the last 168 hours. BNP (last 3 results) No results for input(s): PROBNP in the last 8760 hours. HbA1C: No results for input(s): HGBA1C in the last 72 hours. CBG: Recent Labs  Lab 11/01/21 1621 11/01/21 2210 11/02/21 0751 11/02/21 1142 11/02/21 1607  GLUCAP 259* 268* 259* 231* 210*   Lipid Profile: No results for input(s): CHOL, HDL, LDLCALC, TRIG, CHOLHDL, LDLDIRECT in the last 72 hours. Thyroid Function Tests: No results for input(s): TSH,  T4TOTAL, FREET4, T3FREE, THYROIDAB in the last 72 hours. Anemia Panel: Recent Labs    11/01/21 1810  TIBC NOT CALCULATED  IRON 30*   Urine analysis:    Component Value Date/Time   COLORURINE YELLOW 05/23/2020 2057   APPEARANCEUR CLEAR 05/23/2020 2057   LABSPEC 1.008 05/23/2020 2057   PHURINE 5.0 05/23/2020 2057   GLUCOSEU 50 (A) 05/23/2020 2057   HGBUR LARGE (A) 05/23/2020 2057   BILIRUBINUR NEGATIVE 05/23/2020 2057   BILIRUBINUR neg 10/01/2014 1445   KETONESUR NEGATIVE 05/23/2020 2057   PROTEINUR 100 (A) 05/23/2020 2057   UROBILINOGEN 0.2 10/01/2014 1445   UROBILINOGEN 0.2 09/30/2014 2051   NITRITE NEGATIVE 05/23/2020 2057   LEUKOCYTESUR NEGATIVE 05/23/2020 2057   Recent Results (from the past 240 hour(s))  Resp Panel by RT-PCR (Flu A&B, Covid) Nasopharyngeal Swab     Status: None   Collection Time: 10/25/21  6:32 PM   Specimen: Nasopharyngeal Swab; Nasopharyngeal(NP) swabs in vial transport medium  Result Value Ref Range Status   SARS Coronavirus 2 by RT PCR NEGATIVE NEGATIVE Final    Comment: (NOTE) SARS-CoV-2 target nucleic acids are NOT DETECTED.  The SARS-CoV-2 RNA is generally detectable in upper respiratory specimens during the acute phase of infection. The lowest concentration of SARS-CoV-2 viral copies this assay can detect is 138 copies/mL. A negative result does not preclude SARS-Cov-2 infection and should not be used as the sole basis for treatment or other patient management decisions. A negative result may occur with  improper specimen collection/handling, submission of specimen other than nasopharyngeal swab, presence of viral mutation(s) within the areas targeted by this assay, and inadequate number of viral copies(<138 copies/mL). A negative result must be combined with clinical observations, patient history, and epidemiological information. The expected result is Negative.  Fact Sheet for Patients:  EntrepreneurPulse.com.au  Fact  Sheet for Healthcare Providers:  IncredibleEmployment.be  This test is no t yet approved or cleared by the [place] FDA and  has been authorized for detection and/or diagnosis of SARS-CoV-2 by FDA under an Emergency Use Authorization (EUA). This EUA will remain  in effect (meaning this test can be used) for the duration of the COVID-19 declaration under Section 564(b)(1) of the Act, 21 U.S.C.section 360bbb-3(b)(1), unless the authorization is terminated  or revoked sooner.       Influenza A by PCR NEGATIVE NEGATIVE Final   Influenza B by PCR NEGATIVE NEGATIVE Final    Comment: (NOTE) The Xpert Xpress SARS-CoV-2/FLU/RSV plus assay is intended as an aid in the diagnosis of influenza from Nasopharyngeal swab specimens and should not be used as a sole basis for treatment. Nasal washings and aspirates are unacceptable for  Xpert Xpress SARS-CoV-2/FLU/RSV testing.  Fact Sheet for Patients: EntrepreneurPulse.com.au  Fact Sheet for Healthcare Providers: IncredibleEmployment.be  This test is not yet approved or cleared by the [place] FDA and has been authorized for detection and/or diagnosis of SARS-CoV-2 by FDA under an Emergency Use Authorization (EUA). This EUA will remain in effect (meaning this test can be used) for the duration of the COVID-19 declaration under Section 564(b)(1) of the Act, 21 U.S.C. section 360bbb-3(b)(1), unless the authorization is terminated or revoked.  Performed at [facility], [place] [address] [place], [place] [address]   Culture, blood (Routine X 2) w Reflex to ID Panel     Status: None   Collection Time: 10/27/21 10:19 AM   Specimen: Left Antecubital; Blood  Result Value Ref Range Status   Specimen Description LEFT ANTECUBITAL  Final   Special Requests   Final    BOTTLES DRAWN AEROBIC AND ANAEROBIC Blood Culture adequate volume   Culture   Final    NO GROWTH 5 DAYS Performed at  [facility], [place] [address], [place], [place] [address]    Report Status 11/01/2021 FINAL  Final  Culture, blood (Routine X 2) w Reflex to ID Panel     Status: None   Collection Time: 10/27/21 10:36 AM   Specimen: BLOOD LEFT ARM  Result Value Ref Range Status   Specimen Description BLOOD LEFT ARM  Final   Special Requests   Final    BOTTLES DRAWN AEROBIC AND ANAEROBIC Blood Culture adequate volume   Culture   Final    NO GROWTH 5 DAYS Performed at [facility], [place] [address], [place], [place] [address]    Report Status 11/01/2021 FINAL  Final      Radiology Studies: MR ABDOMEN MRCP WO CONTRAST  Result Date: 11/01/2021 CLINICAL DATA:  Right upper quadrant pain EXAM: MRI ABDOMEN WITHOUT CONTRAST  (INCLUDING MRCP) TECHNIQUE: Multiplanar multisequence MR imaging of the abdomen was performed. Heavily T2-weighted images of the biliary and pancreatic ducts were obtained, and three-dimensional MRCP images were rendered by post processing. COMPARISON:  CT abdomen and pelvis 09/16/2021, abdominal ultrasound 10/29/2021 FINDINGS: Study is limited due to motion. Lower chest: Small right pleural effusion. Extensive irregular signal abnormalities in the visualized right lower lobe. Hepatobiliary: Liver is normal in size in contour with no suspicious mass identified. Hepatic parenchyma demonstrates diffuse T2 hypointensity as well as relatively increased signal intensity on out of phase T1, consistent with hemosiderosis. Gallbladder is moderately distended with no significant wall thickening or pericholecystic edema visualized. No cholelithiasis identified. No biliary ductal dilatation or definite choledocholithiasis, given limitations of motion. Common bile duct measures up to 6 mm in diameter. Pancreas: Appears grossly normal with no definite mass or ductal dilatation. Limited visualization. Spleen: Upper normal size. Diffuse hypointense T2 signal also consistent with hemosiderosis.  Adrenals/Urinary Tract: Adrenal glands appear normal. A few tiny hyperintense T2 signal likely cysts in the right kidney. No hydronephrosis visualized. Stomach/Bowel: Visualized portions within the abdomen are unremarkable. Vascular/Lymphatic: No pathologically enlarged lymph nodes identified. No abdominal aortic aneurysm demonstrated. Other:  No ascites identified. Musculoskeletal: No suspicious bony lesions identified. IMPRESSION: 1. No biliary duct dilatation or definite choledocholithiasis identified. Limited study due to motion. 2. Extensive irregular signal abnormalities in the right lower lobe suggesting pneumonia and/or atelectasis. Small right pleural effusion. 3. 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 Electronically Signed   By: Ofilia Neas M.D.   On: 11/01/2021 15:05  Scheduled Meds:  sodium chloride   Intravenous Once   benzonatate  200 mg Oral TID   calcitRIOL  0.5 mcg Oral Q T,Th,Sa-HD   [START ON 11/03/2021] darbepoetin (ARANESP) injection - DIALYSIS  200 mcg Intravenous Q Fri-HD   diltiazem  120 mg Oral Daily   feeding supplement (NEPRO CARB STEADY)  237 mL Oral BID BM   [START ON 11/05/2021] furosemide  80 mg Oral Once per day on Sun Mon Wed Fri   insulin aspart  0-5 Units Subcutaneous QHS   [START ON 11/03/2021] insulin aspart  0-9 Units Subcutaneous TID WC   insulin glargine-yfgn  18 Units Subcutaneous Daily   lidocaine  1 patch Transdermal Q24H   mouth rinse  15 mL Mouth Rinse BID   metoprolol tartrate  50 mg Oral BID   multivitamin  1 tablet Oral QHS   vancomycin variable dose per unstable renal function (pharmacist dosing)   Does not apply See admin instructions   Continuous Infusions:  sodium chloride 10 mL/hr at 10/27/21 1845   sodium chloride     sodium chloride     ceFEPime (MAXIPIME) IV     heparin 1,700 Units/hr (11/02/21 1725)     LOS: 8 days   Time spent: 25 minutes.  Patrecia Pour, MD [facility] [email or web address] 11/02/2021, 5:56 PM

## 2021-11-02 NOTE — Progress Notes (Signed)
° °  VASCULAR SURGERY ASSESSMENT & PLAN:   POORLY FUNCTIONING RIGHT UPPER ARM FISTULA: The patient appears to have an infiltrate at some point.  He has a significant hematoma with significant bruising.  I would favor leaving the fistula alone for now and reevaluating once this has resolved.  Therefore I plan on placement of a catheter tomorrow.  This is scheduled at his first case.  I written to stop his heparin at 5:30 AM.  His preop orders have been written.  He can restart his Eliquis after the procedure.   SUBJECTIVE:   No complaints this morning.  PHYSICAL EXAM:   Vitals:   11/01/21 1108 11/01/21 2100 11/02/21 0419 11/02/21 0428  BP: 104/61 (!) 105/57 98/63   Pulse: 87 88 83   Resp: 18 19 17    Temp: 98.1 F (36.7 C) 97.8 F (36.6 C) 97.8 F (36.6 C)   TempSrc: Oral Oral Oral   SpO2:  98% 98%   Weight:    109.9 kg  Height:       His fistula still has a good thrill.  LABS:   Lab Results  Component Value Date   WBC 11.1 (H) 11/02/2021   HGB 7.1 (L) 11/02/2021   HCT 23.5 (L) 11/02/2021   MCV 90.7 11/02/2021   PLT 89 (L) 11/02/2021   Lab Results  Component Value Date   CREATININE 4.12 (H) 11/02/2021   Lab Results  Component Value Date   INR 3.0 (H) 10/30/2021   CBG (last 3)  Recent Labs    11/01/21 1621 11/01/21 2210 11/02/21 0751  GLUCAP 259* 268* 259*    PROBLEM LIST:    Principal Problem:   Tricuspid valve vegetation Active Problems:   Diabetes mellitus with end stage renal disease ([place])   PVD (peripheral vascular disease) ([place])   CAD (coronary artery disease), native coronary artery   Pacemaker: Medtronic Advisa DR MRI [id number] Dual chamber pacemaker 10/08/2014   ESRD (end stage renal disease) ([place])   Atrial fibrillation with RVR (HCC)   Thrombocytopenia (HCC)   Hyponatremia   Acute on chronic diastolic CHF (congestive heart failure) (HCC)   Transaminitis   Pacemaker infection ([place])   CURRENT MEDS:    sodium chloride   Intravenous Once    benzonatate  200 mg Oral TID   calcitRIOL  0.5 mcg Oral Q T,Th,Sa-HD   darbepoetin (ARANESP) injection - DIALYSIS  200 mcg Intravenous Q Thu-HD   diltiazem  120 mg Oral Daily   feeding supplement (NEPRO CARB STEADY)  237 mL Oral BID BM   [START ON 11/05/2021] furosemide  80 mg Oral Once per day on Sun Mon Wed Fri   insulin aspart  0-6 Units Subcutaneous TID WC   insulin glargine-yfgn  18 Units Subcutaneous Daily   lidocaine  1 patch Transdermal Q24H   mouth rinse  15 mL Mouth Rinse BID   metoprolol tartrate  50 mg Oral BID   multivitamin  1 tablet Oral QHS   vancomycin variable dose per unstable renal function (pharmacist dosing)   Does not apply See admin instructions    [name] Office: [phone] 11/02/2021

## 2021-11-02 NOTE — Care Management Important Message (Signed)
Important Message  Patient Details  Name: Alexander [name] MRN: [id number] Date of Birth: 03/22/1961   Medicare Important Message Given:  Yes     [name] 11/02/2021, 11:31 AM

## 2021-11-02 NOTE — Telephone Encounter (Signed)
Pt scheduled to see Dr. Hilarie Fredrickson 12/15/21 at 10:10am. Pt mailed appt letter.

## 2021-11-02 NOTE — Progress Notes (Addendum)
[place] for heparin Indication: atrial fibrillation  Allergies  Allergen Reactions   Cytoxan [Cyclophosphamide] Other (See Comments)    Pancytopenia   Keflex [Cephalexin] Nausea And Vomiting   Tylenol [Acetaminophen] Other (See Comments)    Makes the patient sweat   Pletal [Cilostazol] Palpitations and Other (See Comments)    "Can hear heart beating loudly".    Patient Measurements: Height: 6' (182.9 cm) Weight: 109.9 kg (242 lb 4.6 oz) IBW/kg (Calculated) : 77.6 Heparin Dosing Weight: 100kg  Vital Signs: Temp: 97.8 F (36.6 C) (12/22 0419) Temp Source: Oral (12/22 0419) BP: 98/63 (12/22 0419) Pulse Rate: 83 (12/22 0419)  Labs: Recent Labs    10/30/21 1552 10/30/21 1629 10/31/21 0427 11/01/21 1810 11/02/21 0356  HGB 6.7*  --  7.8*  --  7.1*  HCT 22.1*  --  25.0*  --  23.5*  PLT 143*  --  107*  --  89*  APTT  --   --   --  41* 70*  LABPROT  --  30.9*  --   --   --   INR  --  3.0*  --   --   --   HEPARINUNFRC  --   --   --   --  >1.10*  CREATININE 3.58*  --  4.15*  --  4.12*     Estimated Creatinine Clearance: 23.8 mL/min (A) (by C-G formula based on SCr of 4.12 mg/dL (H)).   Medical History: Past Medical History:  Diagnosis Date   Alcohol abuse    Anemia    Arthritis    "patient does not think so."   Atrial fibrillation ([place])    Cardiac syncope 10/07/2014   rapid A fib with 8 sec pauses on converison with syncope- temp pacing wire placed then PPM   Cataract    BILATERAL   Coronary artery disease    Diabetes mellitus    dx---been  awhile   ESRD (end stage renal disease) ([place])    [place]   GERD (gastroesophageal reflux disease)    History of blood transfusion    Hyperlipemia    Hypertension    Osteomyelitis ([place])    right foot   Peripheral vascular disease ([place])    Presence of permanent cardiac pacemaker 10/08/2014   Medtronic    Assessment: 89 yoM admitted with TV endocarditis. Pt  on apixaban PTA for hx AFib. Pt with HD fistula bleeding so transitioning to IV heparin in anticipation of dialysis catheter placement. Last dose of apixaban was 12/20 at 2300 - will utilize aPTT to dose initially.  Heparin level altered by DOAC, aPTT is therapeutic, H/H and pltc remains low but is stable.  Goal of Therapy:  Heparin level 0.3-0.7 units/ml aPTT 66-102 seconds Monitor platelets by anticoagulation protocol: Yes   Plan:  Heparin 1700 units/h Daily aPTT, heparin level, CBC Heparin to stop 12/23 at 0500 preop per VVS    Arrie Senate, PharmD, BCPS, [facility] Clinical Pharmacist [phone] Please check AMION for all [place] numbers 11/02/2021

## 2021-11-02 NOTE — Progress Notes (Signed)
Physical Therapy Treatment Patient Details Name: Alexander [name] MRN: [id number] DOB: Dec 27, 1960 Today's Date: 11/02/2021   History of Present Illness 60 y.o. male presents to ED 12/14 with c/o increasing SoB and cough. Found to be in Afib with RVR, HR to 140s, hypoxic down in the 80%O2 on RA. Chest x-ray showing right basilar pneumonia with resolved left upper lobe pneumonia PMH: ESRD on HD TTS, paroxysmal atrial fibrillation on Eliquis, pacemaker, CAD s/p stent, diastolic CHF, peripheral vascular disease with right BKA, history of GI bleed, type 2 diabetes    PT Comments    Pt continues to have poor awareness of his deficits, reporting his wife and daughter can move him in and out of his wheelchair, but later admits that his wife has hurt her back moving him. Pt reports that he requires his wife to put on his prosthetic for transfers. Pt noted to have wound on R residual limb and educated pt not to try to don his prosthetic until it has healed. Pt requires mod-total A for bed mobility and maxAx2 for pad scoot to<>from drop arm recliner. PT strongly advised pt to go to SNF level rehab so that he can better help his family in his care. Pt declines rehab and reports he will be "fine". PT will continue to follow acutely.     Recommendations for follow up therapy are one component of a multi-disciplinary discharge planning process, led by the attending physician.  Recommendations may be updated based on patient status, additional functional criteria and insurance authorization.  Follow Up Recommendations  Skilled nursing-short term rehab (<3 hours/day)     Assistance Recommended at Discharge Frequent or constant Supervision/Assistance  Equipment Recommendations  None recommended by PT (has necessary equipment)    Recommendations for Other Services       Precautions / Restrictions Precautions Precautions: Fall Restrictions Weight Bearing Restrictions: No     Mobility  Bed Mobility Overal  bed mobility: Needs Assistance Bed Mobility: Sit to Supine;Supine to Sit     Supine to sit: Mod assist;HOB elevated Sit to supine: Total assist   General bed mobility comments: pt able to manage LE off bed, and pull into sidelying, with heavy use of bedrails, requires modA for bringing trunk to upright, Total A to return LE back to bed surface and pull up in bed    Transfers Overall transfer level: Needs assistance   Transfers: Bed to chair/wheelchair/BSC            Lateral/Scoot Transfers: Mod assist;Max assist;+2 physical assistance General transfer comment: modA for offweighting hips to scoot laterally towards chair on his L, with blocking of L knee, maxAx2 for use of pad to perform lateral scoot transfer back to pt R to get back to bed.           Balance Overall balance assessment: Needs assistance Sitting-balance support: Feet unsupported;No upper extremity supported;Bilateral upper extremity supported;Single extremity supported Sitting balance-Leahy Scale: Poor Sitting balance - Comments: requires at least single UE assist to maintain upright                                    Cognition Arousal/Alertness: Awake/alert Behavior During Therapy: Flat affect;Impulsive;Agitated Overall Cognitive Status: No family/caregiver present to determine baseline cognitive functioning Area of Impairment: Memory;Following commands;Safety/judgement;Awareness;Problem solving;Attention                   Current Attention Level: Selective Memory:  Decreased short-term memory Following Commands: Follows one step commands inconsistently;Follows multi-step commands inconsistently;Follows one step commands with increased time Safety/Judgement: Decreased awareness of safety Awareness: Intellectual Problem Solving: Slow processing;Decreased initiation;Difficulty sequencing;Requires verbal cues;Requires tactile cues General Comments: continues to have poor awareness of  his deficits, report wife can lift him "although her back hurts" otherwise "my 300 lb daughter can lift me, she is strong."           General Comments General comments (skin integrity, edema, etc.): VSS on RA      Pertinent Vitals/Pain Pain Assessment: Faces Faces Pain Scale: Hurts even more Pain Location: back with movement Pain Descriptors / Indicators: Grimacing;Guarding;Moaning Pain Intervention(s): Limited activity within patient's tolerance;Monitored during session;Repositioned     PT Goals (current goals can now be found in the care plan section) Acute Rehab PT Goals Patient Stated Goal: none stated PT Goal Formulation: With patient Time For Goal Achievement: 11/14/21 Potential to Achieve Goals: Fair Progress towards PT goals: Progressing toward goals    Frequency    Min 2X/week      PT Plan Current plan remains appropriate       AM-PAC PT "6 Clicks" Mobility   Outcome Measure  Help needed turning from your back to your side while in a flat bed without using bedrails?: None Help needed moving from lying on your back to sitting on the side of a flat bed without using bedrails?: A Lot Help needed moving to and from a bed to a chair (including a wheelchair)?: Total Help needed standing up from a chair using your arms (e.g., wheelchair or bedside chair)?: Total Help needed to walk in hospital room?: Total Help needed climbing 3-5 steps with a railing? : Total 6 Click Score: 10    End of Session Equipment Utilized During Treatment: Gait belt Activity Tolerance: Patient limited by fatigue (pt reports he just had a "sleeping pill") Patient left: in bed;with call bell/phone within reach;with bed alarm set Nurse Communication: Mobility status;Other (comment) (variable HR) PT Visit Diagnosis: Other abnormalities of gait and mobility (R26.89);Muscle weakness (generalized) (M62.81);Difficulty in walking, not elsewhere classified (R26.2);Unsteadiness on feet (R26.81)      Time: 0932-3557 PT Time Calculation (min) (ACUTE ONLY): 31 min  Charges:  $Therapeutic Activity: 23-37 mins                     Alexander [name] B. Migdalia Dk PT, DPT Acute Rehabilitation Services Pager [phone] Office [phone]    [place] 11/02/2021, 2:33 PM

## 2021-11-02 NOTE — H&P (View-Only) (Signed)
° °  VASCULAR SURGERY ASSESSMENT & PLAN:   POORLY FUNCTIONING RIGHT UPPER ARM FISTULA: The patient appears to have an infiltrate at some point.  He has a significant hematoma with significant bruising.  I would favor leaving the fistula alone for now and reevaluating once this has resolved.  Therefore I plan on placement of a catheter tomorrow.  This is scheduled at his first case.  I written to stop his heparin at 5:30 AM.  His preop orders have been written.  He can restart his Eliquis after the procedure.   SUBJECTIVE:   No complaints this morning.  PHYSICAL EXAM:   Vitals:   11/01/21 1108 11/01/21 2100 11/02/21 0419 11/02/21 0428  BP: 104/61 (!) 105/57 98/63   Pulse: 87 88 83   Resp: 18 19 17    Temp: 98.1 F (36.7 C) 97.8 F (36.6 C) 97.8 F (36.6 C)   TempSrc: Oral Oral Oral   SpO2:  98% 98%   Weight:    109.9 kg  Height:       His fistula still has a good thrill.  LABS:   Lab Results  Component Value Date   WBC 11.1 (H) 11/02/2021   HGB 7.1 (L) 11/02/2021   HCT 23.5 (L) 11/02/2021   MCV 90.7 11/02/2021   PLT 89 (L) 11/02/2021   Lab Results  Component Value Date   CREATININE 4.12 (H) 11/02/2021   Lab Results  Component Value Date   INR 3.0 (H) 10/30/2021   CBG (last 3)  Recent Labs    11/01/21 1621 11/01/21 2210 11/02/21 0751  GLUCAP 259* 268* 259*    PROBLEM LIST:    Principal Problem:   Tricuspid valve vegetation Active Problems:   Diabetes mellitus with end stage renal disease ([place])   PVD (peripheral vascular disease) ([place])   CAD (coronary artery disease), native coronary artery   Pacemaker: Medtronic Advisa DR MRI [id number] Dual chamber pacemaker 10/08/2014   ESRD (end stage renal disease) ([place])   Atrial fibrillation with RVR ([facility])   Thrombocytopenia ([facility])   Hyponatremia   Acute on chronic diastolic CHF (congestive heart failure) ([facility])   Transaminitis   Pacemaker infection ([place])   CURRENT MEDS:    sodium chloride   Intravenous Once    benzonatate  200 mg Oral TID   calcitRIOL  0.5 mcg Oral Q T,Th,Sa-HD   darbepoetin (ARANESP) injection - DIALYSIS  200 mcg Intravenous Q Thu-HD   diltiazem  120 mg Oral Daily   feeding supplement (NEPRO CARB STEADY)  237 mL Oral BID BM   [START ON 11/05/2021] furosemide  80 mg Oral Once per day on Sun Mon Wed Fri   insulin aspart  0-6 Units Subcutaneous TID WC   insulin glargine-yfgn  18 Units Subcutaneous Daily   lidocaine  1 patch Transdermal Q24H   mouth rinse  15 mL Mouth Rinse BID   metoprolol tartrate  50 mg Oral BID   multivitamin  1 tablet Oral QHS   vancomycin variable dose per unstable renal function (pharmacist dosing)   Does not apply See admin instructions    [name] Office: [phone] 11/02/2021

## 2021-11-02 NOTE — Progress Notes (Signed)
Spoke on the phone to patient's wife Thayer Headings regarding the patient's chronic back pain. Wife states "it has worsened in the past 2.5 months or so". Wife is concerned that if the back pain cannot be improved the patient be unable to progress with his mobility.

## 2021-11-02 NOTE — Progress Notes (Signed)
Pharmacy Antibiotic Note  Alexander [name] is a 60 y.o. male admitted on 10/25/2021 with SOB and found to have vegetation on TV close to PPM lead. Pharmacy has been consulted for vancomycin and cefepime dosing. Pt is ESRD on HD TTS, last session was 12/10. Received vancomycin and cefepime doses appropriately after HD session.   Pt to receive HD cath 12/23 with HD afterwards.   Plan: Adjust cefepime to 1g qHS while HD off schedule Continue Vancomycin 1000mg  qHD Follow HD schedule, cultures.   Height: 6' (182.9 cm) Weight: 109.9 kg (242 lb 4.6 oz) IBW/kg (Calculated) : 77.6  Temp (24hrs), Avg:97.9 F (36.6 C), Min:97.8 F (36.6 C), Max:98.1 F (36.7 C)  Recent Labs  Lab 10/28/21 0140 10/29/21 0240 10/29/21 0852 10/30/21 1552 10/31/21 0427 11/02/21 0356  WBC 16.0* 22.0*  --  14.3* 13.9* 11.1*  CREATININE 4.23*  --  5.34* 3.58* 4.15* 4.12*     Estimated Creatinine Clearance: 23.8 mL/min (A) (by C-G formula based on SCr of 4.12 mg/dL (H)).    Allergies  Allergen Reactions   Cytoxan [Cyclophosphamide] Other (See Comments)    Pancytopenia   Keflex [Cephalexin] Nausea And Vomiting   Tylenol [Acetaminophen] Other (See Comments)    Makes the patient sweat   Pletal [Cilostazol] Palpitations and Other (See Comments)    "Can hear heart beating loudly".    Antimicrobials this admission: Ceftriaxone x1 12/14  Doxycycline 12/14 >> 12/16 Vancomycin 12/16 >> Cefepime 12/16 >>  Microbiology results: 12/16 BCx: ngtd  Thank you for allowing pharmacy to be a part of this patients care.    Arrie Senate, PharmD, BCPS, [facility] Clinical Pharmacist [phone] Please check AMION for all [place] numbers 11/02/2021

## 2021-11-02 NOTE — Telephone Encounter (Signed)
-----   Message from Irving Copas., MD sent at 11/01/2021 10:28 PM EST ----- Regarding: Follow-up Vaughan Basta, Please set up a follow-up with Dr. Hilarie Fredrickson or Estill Bamberg in 6 to 8 weeks for this patient.  He is going to be hospitalized and undergoing antibiotic therapy for quite a while.  If the clinic visit is in the system it will not be available for his discharge.  The reason for follow-up will be chronic liver disease with likely cirrhosis, abnormal LFTs. Thanks. GM

## 2021-11-02 NOTE — Anesthesia Preprocedure Evaluation (Addendum)
Anesthesia Evaluation  Patient identified by MRN, date of birth, ID band Patient awake    Reviewed: Allergy & Precautions, NPO status , Patient's Chart, lab work & pertinent test results  History of Anesthesia Complications Negative for: history of anesthetic complications  Airway Mallampati: III  TM Distance: >3 FB Neck ROM: Full    Dental  (+) Dental Advisory Given, Poor Dentition, Chipped, Missing   Pulmonary former smoker,    Pulmonary exam normal        Cardiovascular hypertension, Pt. on home beta blockers and Pt. on medications + CAD, + Peripheral Vascular Disease and +CHF  Normal cardiovascular exam+ dysrhythmias Atrial Fibrillation + pacemaker + Valvular Problems/Murmurs    '22 TEE - EF  40 to 45%. Global hypokinesis. Moderate concentric left ventricular hypertrophy. LA moderately dilated. RA mildly dilated. There is a 1x1cm mass attached to the RA pacemaker lead suggestive of thrombus or a vegetation. Mild to moderate mitral valve regurgitation. 2cmx1cm mass attached to the RV lead of the pacemaker suggestive of a vegetation or a thrombus. The Anterior and septal TV leaflets are thickened and suggests involvement with the vegetation. The  septal leaflet is flail. The presence of flail leaflet suggests vegetation. TR is severe.     Neuro/Psych negative neurological ROS  negative psych ROS   GI/Hepatic GERD  ,(+)     substance abuse  alcohol use,   Endo/Other  diabetes, Type 2, Insulin Dependent Obesity Na 129 Cl 94 Ca 7.5   Renal/GU ESRF and DialysisRenal disease     Musculoskeletal  (+) Arthritis ,   Abdominal   Peds  Hematology  (+) anemia ,  Plt 89k    Anesthesia Other Findings   Reproductive/Obstetrics                            Anesthesia Physical Anesthesia Plan  ASA: 4  Anesthesia Plan: General   Post-op Pain Management:    Induction: Intravenous  PONV Risk  Score and Plan: 2 and Treatment may vary due to age or medical condition, Ondansetron, Midazolam and Dexamethasone  Airway Management Planned: LMA  Additional Equipment: None  Intra-op Plan:   Post-operative Plan: Extubation in OR  Informed Consent: I have reviewed the patients History and Physical, chart, labs and discussed the procedure including the risks, benefits and alternatives for the proposed anesthesia with the patient or authorized representative who has indicated his/her understanding and acceptance.     Dental advisory given  Plan Discussed with: CRNA and Anesthesiologist  Anesthesia Plan Comments:        Anesthesia Quick Evaluation

## 2021-11-02 NOTE — Progress Notes (Signed)
Nutrition Follow-up  DOCUMENTATION CODES:   Non-severe (moderate) malnutrition in context of chronic illness  INTERVENTION:   Continue:  Nepro Shake po BID, each supplement provides 425 kcal and 19 grams protein  Renal MVI   Encourage PO intake and pt preferences   NUTRITION DIAGNOSIS:   Moderate Malnutrition related to chronic illness (ESRD) as evidenced by moderate fat depletion, moderate muscle depletion. Ongoing.   GOAL:   Patient will meet greater than or equal to 90% of their needs Progressing.   MONITOR:   PO intake, Supplement acceptance, Labs, Weight trends  REASON FOR ASSESSMENT:   Malnutrition Screening Tool    ASSESSMENT:   60 yo male admitted with acute respiratory failure, acute on chronic CHF, CAP, PAF with RVR. PMH includes ESRD on HD, PAF, pacemaker, CAD, CHF, PVD, R BKA, DM-2.  Pt treated for PNA and endocarditis on abx.  Per renal pt with poorly functioning RUE AVF, plan for HD catheter placement 12/23.   Per pt he is unable to see me, even if I am closer to him. He lives with wife and kids.   Per pt he was 260 lb and lost down to 237 lb (pt's HD dry weight is 238 lb / 108.5 kg) due to poor appetite. Pt currently 241 lb with mild edema.   Pt reports poor appetite a few months ago. He states recently he has been eating better. He eats Breakfast and Dinner which are made for him but he is not specific about what he eats. He does not eat lunch and says its because he is lazy. He is followed by a RD at the HD center. He has drank Nepro before and is willing to drink here. He has drank a variety of supplements at home but nothing consistently.   Medications reviewed and include: lasix (Sun, Mon, Wed, Fri), SSI, semglee, Rena-vit Labs reviewed: Na 129, PO4: 4.7 CBG's: 231-328   NUTRITION - FOCUSED PHYSICAL EXAM:  Flowsheet Row Most Recent Value  Orbital Region Moderate depletion  Upper Arm Region Moderate depletion  Thoracic and Lumbar Region No  depletion  Buccal Region Mild depletion  Temple Region Mild depletion  Clavicle Bone Region No depletion  Clavicle and Acromion Bone Region Mild depletion  Scapular Bone Region Unable to assess  Dorsal Hand Moderate depletion  Patellar Region Moderate depletion  Anterior Thigh Region Moderate depletion  Posterior Calf Region No depletion  [edema]  Edema (RD Assessment) Mild  Hair Reviewed  Eyes Reviewed  Mouth Reviewed  [missing teeth]  Skin Reviewed  [bruise/blister on R stump]  Nails Reviewed       Diet Order:   Diet Order             Diet NPO time specified Except for: Sips with Meds  Diet effective midnight           Diet renal/carb modified with fluid restriction Diet-HS Snack? Nothing; Fluid restriction: 1200 mL Fluid; Room service appropriate? Yes; Fluid consistency: Thin  Diet effective now                   EDUCATION NEEDS:   Not appropriate for education at this time  Skin:  Skin Assessment: Reviewed RN Assessment  Last BM:  no BM documented  Height:   Ht Readings from Last 1 Encounters:  10/26/21 6' (1.829 m)    Weight:   Wt Readings from Last 1 Encounters:  11/02/21 109.9 kg    BMI:  Body mass index is 32.86  kg/m.  Estimated Nutritional Needs:   Kcal:  3074-6002  Protein:  110-130 gm  Fluid:  1 L + UOP  Brantleigh Mifflin P., RD, LDN, CNSC See AMiON for contact information

## 2021-11-02 NOTE — Plan of Care (Signed)
  Problem: Coping: Goal: Level of anxiety will decrease Outcome: Progressing   Problem: Elimination: Goal: Will not experience complications related to bowel motility Outcome: Progressing   

## 2021-11-03 ENCOUNTER — Encounter (HOSPITAL_COMMUNITY)
Disposition: A | Discharge status: Home / Self Care | Payer: Self-pay | Source: Home / Self Care | Attending: Family Medicine

## 2021-11-03 ENCOUNTER — Inpatient Hospital Stay (HOSPITAL_COMMUNITY): Payer: Medicare Other | Admitting: Anesthesiology

## 2021-11-03 ENCOUNTER — Inpatient Hospital Stay (HOSPITAL_COMMUNITY): Payer: Medicare Other

## 2021-11-03 DIAGNOSIS — Z992 Dependence on renal dialysis: Secondary | ICD-10-CM

## 2021-11-03 DIAGNOSIS — T82898A Other specified complication of vascular prosthetic devices, implants and grafts, initial encounter: Secondary | ICD-10-CM

## 2021-11-03 DIAGNOSIS — N186 End stage renal disease: Secondary | ICD-10-CM

## 2021-11-03 HISTORY — PX: INSERTION OF DIALYSIS CATHETER: SHX1324

## 2021-11-03 LAB — CBC
HCT: 22.3 % — ABNORMAL LOW (ref 39.0–52.0)
Hemoglobin: 6.6 g/dL — CL (ref 13.0–17.0)
MCH: 27 pg (ref 26.0–34.0)
MCHC: 29.6 g/dL — ABNORMAL LOW (ref 30.0–36.0)
MCV: 91.4 fL (ref 80.0–100.0)
Platelets: 89 10*3/uL — ABNORMAL LOW (ref 150–400)
RBC: 2.44 MIL/uL — ABNORMAL LOW (ref 4.22–5.81)
RDW: 22.7 % — ABNORMAL HIGH (ref 11.5–15.5)
WBC: 12.5 10*3/uL — ABNORMAL HIGH (ref 4.0–10.5)
nRBC: 0.2 % (ref 0.0–0.2)

## 2021-11-03 LAB — SURGICAL PCR SCREEN
MRSA, PCR: NEGATIVE
Staphylococcus aureus: NEGATIVE

## 2021-11-03 LAB — RENAL FUNCTION PANEL
Albumin: 1.6 g/dL — ABNORMAL LOW (ref 3.5–5.0)
Anion gap: 8 (ref 5–15)
BUN: 45 mg/dL — ABNORMAL HIGH (ref 8–23)
CO2: 26 mmol/L (ref 22–32)
Calcium: 7.8 mg/dL — ABNORMAL LOW (ref 8.9–10.3)
Chloride: 96 mmol/L — ABNORMAL LOW (ref 98–111)
Creatinine, Ser: 4.79 mg/dL — ABNORMAL HIGH (ref 0.61–1.24)
GFR, Estimated: 13 mL/min — ABNORMAL LOW (ref >60)
Glucose, Bld: 186 mg/dL — ABNORMAL HIGH (ref 70–99)
Phosphorus: 5.2 mg/dL — ABNORMAL HIGH (ref 2.5–4.6)
Potassium: 3.6 mmol/L (ref 3.5–5.1)
Sodium: 130 mmol/L — ABNORMAL LOW (ref 135–145)

## 2021-11-03 LAB — GLUCOSE, CAPILLARY
Glucose-Capillary: 165 mg/dL — ABNORMAL HIGH (ref 70–99)
Glucose-Capillary: 156 mg/dL — ABNORMAL HIGH (ref 70–99)
Glucose-Capillary: 176 mg/dL — ABNORMAL HIGH (ref 70–99)
Glucose-Capillary: 211 mg/dL — ABNORMAL HIGH (ref 70–99)
Glucose-Capillary: 126 mg/dL — ABNORMAL HIGH (ref 70–99)
Glucose-Capillary: 158 mg/dL — ABNORMAL HIGH (ref 70–99)

## 2021-11-03 LAB — PREPARE RBC (CROSSMATCH)

## 2021-11-03 LAB — HEMOGLOBIN AND HEMATOCRIT, BLOOD
HCT: 27 % — ABNORMAL LOW (ref 39.0–52.0)
Hemoglobin: 8 g/dL — ABNORMAL LOW (ref 13.0–17.0)

## 2021-11-03 SURGERY — INSERTION OF DIALYSIS CATHETER
Anesthesia: General | Site: Neck | Laterality: Right

## 2021-11-03 MED ORDER — PROPOFOL 10 MG/ML IV BOLUS
INTRAVENOUS | Status: DC | PRN
  Administered 2021-11-03: 90 mg via INTRAVENOUS

## 2021-11-03 MED ORDER — SODIUM CHLORIDE 0.9% IV SOLUTION: Freq: Once | INTRAVENOUS | Status: AC

## 2021-11-03 MED ORDER — ONDANSETRON HCL 4 MG/2ML IJ SOLN
INTRAMUSCULAR | Status: AC
  Filled 2021-11-03: qty 2

## 2021-11-03 MED ORDER — ONDANSETRON HCL 4 MG/2ML IJ SOLN: 4.0000 mg | Freq: Once | INTRAMUSCULAR | Status: DC | PRN

## 2021-11-03 MED ORDER — OXYCODONE HCL 5 MG/5ML PO SOLN: 5.0000 mg | Freq: Once | ORAL | Status: DC | PRN

## 2021-11-03 MED ORDER — PROPOFOL 10 MG/ML IV BOLUS
INTRAVENOUS | Status: AC
  Filled 2021-11-03: qty 20

## 2021-11-03 MED ORDER — EPHEDRINE SULFATE-NACL 50-0.9 MG/10ML-% IV SOSY
PREFILLED_SYRINGE | INTRAVENOUS | Status: DC | PRN
  Administered 2021-11-03: 10 mg via INTRAVENOUS

## 2021-11-03 MED ORDER — FLUTICASONE PROPIONATE 50 MCG/ACT NA SUSP
2.0000 | Freq: Every day | NASAL | Status: DC
  Administered 2021-11-03 – 2021-11-06 (×4): 2 via NASAL
  Filled 2021-11-03: qty 16

## 2021-11-03 MED ORDER — CHLORHEXIDINE GLUCONATE CLOTH 2 % EX PADS
6.0000 | MEDICATED_PAD | Freq: Every day | CUTANEOUS | Status: DC
  Administered 2021-11-03 – 2021-11-05 (×3): 6 via TOPICAL

## 2021-11-03 MED ORDER — PHENYLEPHRINE 40 MCG/ML (10ML) SYRINGE FOR IV PUSH (FOR BLOOD PRESSURE SUPPORT)
PREFILLED_SYRINGE | INTRAVENOUS | Status: DC | PRN
  Administered 2021-11-03: 80 ug via INTRAVENOUS
  Administered 2021-11-03: 200 ug via INTRAVENOUS
  Administered 2021-11-03 (×4): 120 ug via INTRAVENOUS

## 2021-11-03 MED ORDER — HEPARIN SODIUM (PORCINE) 1000 UNIT/ML IJ SOLN
INTRAMUSCULAR | Status: DC | PRN
  Administered 2021-11-03: 3800 [IU] via INTRAVENOUS

## 2021-11-03 MED ORDER — LIDOCAINE 2% (20 MG/ML) 5 ML SYRINGE
INTRAMUSCULAR | Status: AC
  Filled 2021-11-03: qty 5

## 2021-11-03 MED ORDER — VANCOMYCIN HCL 1000 MG IV SOLR
1000.0000 mg | INTRAVENOUS | Status: AC
  Administered 2021-11-03: 08:00:00 1000 mg via INTRAVENOUS
  Filled 2021-11-03: qty 20

## 2021-11-03 MED ORDER — LIDOCAINE-PRILOCAINE 2.5-2.5 % EX CREA: 1.0000 "application " | TOPICAL_CREAM | CUTANEOUS | Status: DC | PRN

## 2021-11-03 MED ORDER — OXYCODONE HCL 5 MG PO TABS: 5.0000 mg | ORAL_TABLET | Freq: Once | ORAL | Status: DC | PRN

## 2021-11-03 MED ORDER — VANCOMYCIN HCL 1000 MG IV SOLR
1000.0000 mg | Freq: Two times a day (BID) | INTRAVENOUS | Status: DC
  Filled 2021-11-03: qty 20

## 2021-11-03 MED ORDER — FENTANYL CITRATE (PF) 250 MCG/5ML IJ SOLN
INTRAMUSCULAR | Status: AC
  Filled 2021-11-03: qty 5

## 2021-11-03 MED ORDER — MIDAZOLAM HCL 2 MG/2ML IJ SOLN
INTRAMUSCULAR | Status: AC
  Filled 2021-11-03: qty 2

## 2021-11-03 MED ORDER — METHOCARBAMOL 500 MG PO TABS: 500.0000 mg | ORAL_TABLET | Freq: Three times a day (TID) | ORAL | Status: DC

## 2021-11-03 MED ORDER — APIXABAN 5 MG PO TABS
5.0000 mg | ORAL_TABLET | Freq: Two times a day (BID) | ORAL | Status: DC
  Administered 2021-11-03 – 2021-11-06 (×7): 5 mg via ORAL
  Filled 2021-11-03 (×7): qty 1

## 2021-11-03 MED ORDER — GLUCOSE BLOOD VI STRP: 1.0000 | ORAL_STRIP | Status: DC | PRN

## 2021-11-03 MED ORDER — FENTANYL CITRATE (PF) 100 MCG/2ML IJ SOLN: 25.0000 ug | INTRAMUSCULAR | Status: DC | PRN

## 2021-11-03 MED ORDER — ADULT MULTIVITAMIN W/MINERALS CH: 1.0000 | ORAL_TABLET | ORAL | Status: DC

## 2021-11-03 MED ORDER — MIDAZOLAM HCL 2 MG/2ML IJ SOLN
INTRAMUSCULAR | Status: DC | PRN
  Administered 2021-11-03: 2 mg via INTRAVENOUS

## 2021-11-03 MED ORDER — HEPARIN 6000 UNIT IRRIGATION SOLUTION
Status: DC | PRN
  Administered 2021-11-03: 1

## 2021-11-03 MED ORDER — LIDOCAINE 2% (20 MG/ML) 5 ML SYRINGE
INTRAMUSCULAR | Status: DC | PRN
  Administered 2021-11-03: 80 mg via INTRAVENOUS

## 2021-11-03 SURGICAL SUPPLY — 49 items
ADH SKN CLS LQ APL DERMABOND (GAUZE/BANDAGES/DRESSINGS) ×1
APL PRP STRL LF DISP 70% ISPRP (MISCELLANEOUS) ×1
BAG BANDED W/RUBBER/TAPE 36X54 (MISCELLANEOUS) ×2 IMPLANT
BAG COUNTER SPONGE SURGICOUNT (BAG) ×2 IMPLANT
BAG DECANTER FOR FLEXI CONT (MISCELLANEOUS) ×3 IMPLANT
BAG EQP BAND 135X91 W/RBR TAPE (MISCELLANEOUS) ×1
BAG SPNG CNTER NS LX DISP (BAG) ×1
BAG SURGICOUNT SPONGE COUNTING (BAG) ×1
BIOPATCH RED 1 DISK 7.0 (GAUZE/BANDAGES/DRESSINGS) ×2 IMPLANT
BIOPATCH RED 1IN DISK 7.0MM (GAUZE/BANDAGES/DRESSINGS) ×1
CATH PALINDROME-P 19CM W/VT (CATHETERS) IMPLANT
CATH PALINDROME-P 23CM W/VT (CATHETERS) ×2 IMPLANT
CATH PALINDROME-P 28CM W/VT (CATHETERS) IMPLANT
CHLORAPREP W/TINT 26 (MISCELLANEOUS) ×3 IMPLANT
COVER DOME SNAP 22 D (MISCELLANEOUS) ×2 IMPLANT
COVER PROBE W GEL 5X96 (DRAPES) IMPLANT
COVER SURGICAL LIGHT HANDLE (MISCELLANEOUS) ×3 IMPLANT
DERMABOND ADHESIVE PROPEN (GAUZE/BANDAGES/DRESSINGS) ×2
DERMABOND ADVANCED .7 DNX6 (GAUZE/BANDAGES/DRESSINGS) IMPLANT
DRAPE C-ARM 42X72 X-RAY (DRAPES) ×3 IMPLANT
DRAPE CHEST BREAST 15X10 FENES (DRAPES) ×3 IMPLANT
GAUZE 4X4 16PLY ~~LOC~~+RFID DBL (SPONGE) ×3 IMPLANT
GLOVE SRG 8 PF TXTR STRL LF DI (GLOVE) ×1 IMPLANT
GLOVE SURG ENC MOIS LTX SZ7.5 (GLOVE) ×3 IMPLANT
GLOVE SURG POLY ORTHO LF SZ7.5 (GLOVE) IMPLANT
GLOVE SURG UNDER LTX SZ8 (GLOVE) ×3 IMPLANT
GLOVE SURG UNDER POLY LF SZ8 (GLOVE) ×3
GOWN STRL REUS W/ TWL LRG LVL3 (GOWN DISPOSABLE) ×2 IMPLANT
GOWN STRL REUS W/TWL LRG LVL3 (GOWN DISPOSABLE) ×6
KIT BASIN OR (CUSTOM PROCEDURE TRAY) ×3 IMPLANT
KIT PALINDROME-P 55CM (CATHETERS) IMPLANT
KIT TURNOVER KIT B (KITS) ×3 IMPLANT
NDL 18GX1X1/2 (RX/OR ONLY) (NEEDLE) ×1 IMPLANT
NDL HYPO 25GX1X1/2 BEV (NEEDLE) ×1 IMPLANT
NEEDLE 18GX1X1/2 (RX/OR ONLY) (NEEDLE) ×3 IMPLANT
NEEDLE HYPO 25GX1X1/2 BEV (NEEDLE) ×3 IMPLANT
NS IRRIG 1000ML POUR BTL (IV SOLUTION) ×3 IMPLANT
PACK SURGICAL SETUP 50X90 (CUSTOM PROCEDURE TRAY) ×3 IMPLANT
PAD ARMBOARD 7.5X6 YLW CONV (MISCELLANEOUS) ×6 IMPLANT
SET MICROPUNCTURE 5F STIFF (MISCELLANEOUS) ×2 IMPLANT
SUT ETHILON 3 0 PS 1 (SUTURE) ×3 IMPLANT
SUT MNCRL AB 4-0 PS2 18 (SUTURE) ×3 IMPLANT
SYR 10ML LL (SYRINGE) ×3 IMPLANT
SYR 20ML LL LF (SYRINGE) ×6 IMPLANT
SYR 5ML LL (SYRINGE) ×8 IMPLANT
SYR CONTROL 10ML LL (SYRINGE) ×3 IMPLANT
TOWEL GREEN STERILE (TOWEL DISPOSABLE) ×6 IMPLANT
TOWEL GREEN STERILE FF (TOWEL DISPOSABLE) ×3 IMPLANT
WATER STERILE IRR 1000ML POUR (IV SOLUTION) ×3 IMPLANT

## 2021-11-03 NOTE — Progress Notes (Addendum)
PROGRESS NOTE  Alexander [name]  YYT:[id number] DOB: 1961-06-19 DOA: 10/25/2021 PCP: [name], MD   Brief Narrative: Patient is a 60 year old male with ESRD on HD, TTS, paroxysmal A. fib on Eliquis, pacemaker, CAD status post stent, diastolic CHF, PVD with right BKA, prior history of GI bleed, diabetes mellitus type 2 presented with increasing shortness of breath and cough.  Per patient he was diagnosed with pneumonia in early November and completed course of antibiotics.  However he has not felt back to baseline since.  Has had persistent shortness of breath and coughing, fatigue.  Also noted some anterior and right lateral sided rib pain worse with coughing and worsening left lower extremity edema.  He did complete his HD last session prior to admission. In ED, patient was hypoxic down to 80%, was put on 2 L BNP 1000 181. Chest x-ray showed right basilar pneumonia, he was placed on IV Rocephin and doxycycline.   Assessment & Plan: Principal Problem:   Tricuspid valve vegetation Active Problems:   Diabetes mellitus with end stage renal disease (HCC)   PVD (peripheral vascular disease) (HCC)   CAD (coronary artery disease), native coronary artery   Pacemaker: Medtronic Advisa DR MRI [id number] Dual chamber pacemaker 10/08/2014   ESRD (end stage renal disease) ([place])   Atrial fibrillation with RVR (HCC)   Thrombocytopenia (HCC)   Hyponatremia   Acute on chronic diastolic CHF (congestive heart failure) (HCC)   Transaminitis   Pacemaker infection ([place])   Malnutrition of moderate degree  Acute hypoxic respiratory failure: Due to acute on chronic HFpEF, RLL CAP, PAF with RVR:  - Continue abx, antitussives, pulmonary hygiene  - Wean oxygen as tolerated - Continue abx as below which will treat CAP. Per CT chest 12/16 there is parapneumonic effusion, empyema not excluded, though effusion appears small and pt will be treated with protracted abx course. Recommend repeat chest CT after completion  of treatment. - Incentive spirometry, flutter valve, OOB  ESRD:  - Continue HD per nephrology thru [facility] placed 12/23. HD 12/23, 12/24 to return to usual TTS schedule.   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 No discitis or abscess on CT.  - Risk of worsening infection/embolization with pacemaker removal/reimplantation in setting of active infection is prohibitive, so plan is to treat with IV abx per ID (vancomycin and cefepime after HD thru 12/08/2021) with subsequent transition to oral agent(s). Has [facility] follow up 1/12 with Dr. Gale Journey.   Acute blood loss anemia: Required 1u PRBCs 12/19, additional 1u PRBCs 12/23. No active bleeding, but stigmata of considerable prior bleeding/ecchymoses noted.  - Ok to convert to eliquis with no ongoing bleeding.  - Redosing ESA with HD today per nephrology.  - Serial CBC with transfusion threshold 7g/dl - Transferrin noted to be undetectably low, Ferritin is 2,418 acting as acute phase reactant.  Permanent atrial fibrillation, sinus node dysfunction s/p PPM: Rate is controlled/paced.  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 Stable relatively. Ammonia normal, hepatitis panel negative. Motion-degraded MRCP showed no biliary ductal dilatation or choledocholithiasis. There was mention of hemosiderosis involving the liver and spleen. - Consider liver biopsy after pt is more clinically stable/infection treatment completed. HFE gene mutation testing could also be pursued.  - Monitor CMP intermittently.  IDT2DM: HbA1c 7.8%.  - DC'ed 70/30 insulin due  to hypoglycemia, may not be best option for pt going forward. Augment sliding scale to sensitive given persistent elevations. Continue 18u glargine. Improving at/near inpatient goal. Will  continue monitoring for further titration, possibly mealtime insulin addition.  Moderate protein calorie malnutrition: Hypoalbuminemia - Supplement protein as able.   PAD: s/p RLE bypass prior to BKA, left fem-TP trunk bypass, 2-stage basilic vein AVF.  - Vascular surgery involved in care as above. No critical limb ischemia currently. - Continue anticoagulation, once LFTs stable, restart statin  Right stump site blisters: Stable, no evidence of infection.  - Advised not to don prosthesis until this heals.   High fall risk with inability to use prosthesis. Discussed at length with OT today. He will need to work with therapy on transfers with attention to his ability to get into his car to go to outpatient dialysis with his current deficits.  - Will continue PT/OT here as much as possible. Pt declines SNF. Hoping for improvement with therapy here sufficient to facilitate home discharge. He does now assent to home health therapies.  Unstageable left heel wound: hx left transmetatarsal amputation as well. High risk for opor wound healing.  - Offload, prevalon boot.  - WOC consulted - Will need proper shoe fitting at [place] after discharge.   Obesity: Estimated body mass index is 14.72 kg/m as calculated from the following:   Height as of this encounter: 6' (1.829 m).   Weight as of this encounter: 49.2 kg.  DVT prophylaxis: Heparin gtt Code Status: Full Family Communication: None at bedside. Spoke with daughter by phone 12/23. Disposition Plan:  Status is: Inpatient  Remains inpatient appropriate because: Continued inpatient management.   Consultants:  Nephrology ID Vascular surgery Cardiology  Subjective: Groggy after procedure, worked with OT and proved ability to perform some transfers with assistance and do bed level mobility. Still declines rehabilitation. No dyspnea or chest pain. Back pain is stable.   Objective: Vitals:   11/03/21 0845 11/03/21 0904 11/03/21 0917  11/03/21 1218  BP: 112/76 114/71 112/73 129/85  Pulse: 73  83 (!) 104  Resp: 17 (!) 22 20 (!) 22  Temp:  98.4 F (36.9 C) 97.8 F (36.6 C) 97.7 F (36.5 C)  TempSrc:   Oral Oral  SpO2: 92% 97%    Weight:      Height:        Intake/Output Summary (Last 24 hours) at 11/03/2021 1247 Last data filed at 11/03/2021 0830 Gross per 24 hour  Intake 2111.13 ml  Output 5 ml  Net 2106.13 ml   Filed Weights   11/01/21 0500 11/02/21 0428 11/03/21 0357  Weight: 107.7 kg 109.9 kg 49.2 kg   Gen: 60 y.o. male in no distress Pulm: Nonlabored breathing room air. Diminished at right base laterally. CV: Irreg irreg without murmur, rub, or gallop. No JVD, no significant dependent edema. GI: Abdomen soft, mildly tender in RUQ without rebound or Murphy's sign, liver edge not palpated. Non-distended, with normoactive bowel sounds.  Ext: Warm, no new deformities. Mild dupuytren's contractures bilaterally. Skin: Right stump site with stable intact blisters no erythema/purulence. Left heel with purple discoloration. TMA on left. Right TDC site hemostatic. Neuro: Alert and oriented. No focal neurological deficits. Psych: Judgement and insight appear fair. Mood euthymic & affect congruent. Behavior is appropriate.    Data Reviewed: I have personally reviewed following labs and imaging studies  CBC: Recent Labs  Lab 10/29/21 0240 10/30/21 1552 10/31/21 0427 11/02/21 0356 11/03/21 0122 11/03/21 1049  WBC 22.0*  14.3* 13.9* 11.1* 12.5*  --   HGB 8.0* 6.7* 7.8* 7.1* 6.6* 8.0*  HCT 27.4* 22.1* 25.0* 23.5* 22.3* 27.0*  MCV 91.6 89.1 88.0 90.7 91.4  --   PLT 153 143* 107* 89* 89*  --    Basic Metabolic Panel: Recent Labs  Lab 10/29/21 0852 10/30/21 1552 10/31/21 0427 11/02/21 0356 11/03/21 0122  NA 130* 133* 131* 129* 130*  K 4.2 4.2 4.1 3.6 3.6  CL 94* 96* 96* 94* 96*  CO2 25 27 25 23 26   GLUCOSE 134* 221* 283* 241* 186*  BUN 54* 31* 41* 36* 45*  CREATININE 5.34* 3.58* 4.15* 4.12* 4.79*   CALCIUM 7.8* 7.9* 7.6* 7.5* 7.8*  PHOS  --   --   --  4.7* 5.2*   GFR: Estimated Creatinine Clearance: 11.1 mL/min (A) (by C-G formula based on SCr of 4.79 mg/dL (H)). Liver Function Tests: Recent Labs  Lab 10/28/21 0140 10/29/21 0852 10/30/21 1552 10/31/21 0427 11/02/21 0356 11/03/21 0122  AST 240* 180* 71* 42*  --   --   ALT 164* 174* 115* 87*  --   --   ALKPHOS 93 80 74 65  --   --   BILITOT 1.4* 1.8* 1.9* 1.9*  --   --   PROT 5.6* 5.5* 5.4* 5.3*  --   --   ALBUMIN 2.0* 1.8* 1.7* 1.6* 1.6* 1.6*   No results for input(s): LIPASE, AMYLASE in the last 168 hours. Recent Labs  Lab 10/29/21 0852 10/30/21 1422  AMMONIA 21 20   Coagulation Profile: Recent Labs  Lab 10/30/21 1629  INR 3.0*   Cardiac Enzymes: No results for input(s): CKTOTAL, CKMB, CKMBINDEX, TROPONINI in the last 168 hours. BNP (last 3 results) No results for input(s): PROBNP in the last 8760 hours. HbA1C: No results for input(s): HGBA1C in the last 72 hours. CBG: Recent Labs  Lab 11/02/21 2149 11/03/21 0641 11/03/21 0831 11/03/21 0924 11/03/21 1216  GLUCAP 168* 165* 156* 176* 211*   Lipid Profile: No results for input(s): CHOL, HDL, LDLCALC, TRIG, CHOLHDL, LDLDIRECT in the last 72 hours. Thyroid Function Tests: No results for input(s): TSH, T4TOTAL, FREET4, T3FREE, THYROIDAB in the last 72 hours. Anemia Panel: Recent Labs    11/01/21 1810  TIBC NOT CALCULATED  IRON 30*   Urine analysis:    Component Value Date/Time   COLORURINE YELLOW 05/23/2020 2057   APPEARANCEUR CLEAR 05/23/2020 2057   LABSPEC 1.008 05/23/2020 2057   PHURINE 5.0 05/23/2020 2057   GLUCOSEU 50 (A) 05/23/2020 2057   HGBUR LARGE (A) 05/23/2020 2057   BILIRUBINUR NEGATIVE 05/23/2020 2057   BILIRUBINUR neg 10/01/2014 1445   KETONESUR NEGATIVE 05/23/2020 2057   PROTEINUR 100 (A) 05/23/2020 2057   UROBILINOGEN 0.2 10/01/2014 1445   UROBILINOGEN 0.2 09/30/2014 2051   NITRITE NEGATIVE 05/23/2020 2057   LEUKOCYTESUR  NEGATIVE 05/23/2020 2057   Recent Results (from the past 240 hour(s))  Resp Panel by RT-PCR (Flu A&B, Covid) Nasopharyngeal Swab     Status: None   Collection Time: 10/25/21  6:32 PM   Specimen: Nasopharyngeal Swab; Nasopharyngeal(NP) swabs in vial transport medium  Result Value Ref Range Status   SARS Coronavirus 2 by RT PCR NEGATIVE NEGATIVE Final    Comment: (NOTE) SARS-CoV-2 target nucleic acids are NOT DETECTED.  The SARS-CoV-2 RNA is generally detectable in upper respiratory specimens during the acute phase of infection. The lowest concentration of SARS-CoV-2 viral copies this assay can detect is 138 copies/mL. A negative result does not preclude  SARS-Cov-2 infection and should not be used as the sole basis for treatment or other patient management decisions. A negative result may occur with  improper specimen collection/handling, submission of specimen other than nasopharyngeal swab, presence of viral mutation(s) within the areas targeted by this assay, and inadequate number of viral copies(<138 copies/mL). A negative result must be combined with clinical observations, patient history, and epidemiological information. The expected result is Negative.  Fact Sheet for Patients:  EntrepreneurPulse.com.au  Fact Sheet for Healthcare Providers:  IncredibleEmployment.be  This test is no t yet approved or cleared by the [place] FDA and  has been authorized for detection and/or diagnosis of SARS-CoV-2 by FDA under an Emergency Use Authorization (EUA). This EUA will remain  in effect (meaning this test can be used) for the duration of the COVID-19 declaration under Section 564(b)(1) of the Act, 21 U.S.C.section 360bbb-3(b)(1), unless the authorization is terminated  or revoked sooner.       Influenza A by PCR NEGATIVE NEGATIVE Final   Influenza B by PCR NEGATIVE NEGATIVE Final    Comment: (NOTE) The Xpert Xpress SARS-CoV-2/FLU/RSV plus  assay is intended as an aid in the diagnosis of influenza from Nasopharyngeal swab specimens and should not be used as a sole basis for treatment. Nasal washings and aspirates are unacceptable for Xpert Xpress SARS-CoV-2/FLU/RSV testing.  Fact Sheet for Patients: EntrepreneurPulse.com.au  Fact Sheet for Healthcare Providers: IncredibleEmployment.be  This test is not yet approved or cleared by the [place] FDA and has been authorized for detection and/or diagnosis of SARS-CoV-2 by FDA under an Emergency Use Authorization (EUA). This EUA will remain in effect (meaning this test can be used) for the duration of the COVID-19 declaration under Section 564(b)(1) of the Act, 21 U.S.C. section 360bbb-3(b)(1), unless the authorization is terminated or revoked.  Performed at [facility], [place] [address], [place], [place] [address]   Culture, blood (Routine X 2) w Reflex to ID Panel     Status: None   Collection Time: 10/27/21 10:19 AM   Specimen: Left Antecubital; Blood  Result Value Ref Range Status   Specimen Description LEFT ANTECUBITAL  Final   Special Requests   Final    BOTTLES DRAWN AEROBIC AND ANAEROBIC Blood Culture adequate volume   Culture   Final    NO GROWTH 5 DAYS Performed at [facility], [place] [address], [place], [place] [address]    Report Status 11/01/2021 FINAL  Final  Culture, blood (Routine X 2) w Reflex to ID Panel     Status: None   Collection Time: 10/27/21 10:36 AM   Specimen: BLOOD LEFT ARM  Result Value Ref Range Status   Specimen Description BLOOD LEFT ARM  Final   Special Requests   Final    BOTTLES DRAWN AEROBIC AND ANAEROBIC Blood Culture adequate volume   Culture   Final    NO GROWTH 5 DAYS Performed at [facility], [place] [address] [place], [place] [address]    Report Status 11/01/2021 FINAL  Final  Surgical pcr screen     Status: None   Collection Time: 11/03/21  7:00 AM   Specimen:  Nasal Mucosa; Nasal Swab  Result Value Ref Range Status   MRSA, PCR NEGATIVE NEGATIVE Final   Staphylococcus aureus NEGATIVE NEGATIVE Final    Comment: (NOTE) The Xpert SA Assay (FDA approved for NASAL specimens in patients 48 years of age and older), is one component of a comprehensive surveillance program. It is  not intended to diagnose infection nor to guide or monitor treatment. Performed at [facility], [place] [address] [place], [place] [address]       Radiology Studies: MR ABDOMEN MRCP WO CONTRAST  Result Date: 11/01/2021 CLINICAL DATA:  Right upper quadrant pain EXAM: MRI ABDOMEN WITHOUT CONTRAST  (INCLUDING MRCP) TECHNIQUE: Multiplanar multisequence MR imaging of the abdomen was performed. Heavily T2-weighted images of the biliary and pancreatic ducts were obtained, and three-dimensional MRCP images were rendered by post processing. COMPARISON:  CT abdomen and pelvis 09/16/2021, abdominal ultrasound 10/29/2021 FINDINGS: Study is limited due to motion. Lower chest: Small right pleural effusion. Extensive irregular signal abnormalities in the visualized right lower lobe. Hepatobiliary: Liver is normal in size in contour with no suspicious mass identified. Hepatic parenchyma demonstrates diffuse T2 hypointensity as well as relatively increased signal intensity on out of phase T1, consistent with hemosiderosis. Gallbladder is moderately distended with no significant wall thickening or pericholecystic edema visualized. No cholelithiasis identified. No biliary ductal dilatation or definite choledocholithiasis, given limitations of motion. Common bile duct measures up to 6 mm in diameter. Pancreas: Appears grossly normal with no definite mass or ductal dilatation. Limited visualization. Spleen: Upper normal size. Diffuse hypointense T2 signal also consistent with hemosiderosis. Adrenals/Urinary Tract: Adrenal glands appear normal. A few tiny hyperintense T2 signal likely cysts in the right  kidney. No hydronephrosis visualized. Stomach/Bowel: Visualized portions within the abdomen are unremarkable. Vascular/Lymphatic: No pathologically enlarged lymph nodes identified. No abdominal aortic aneurysm demonstrated. Other:  No ascites identified. Musculoskeletal: No suspicious bony lesions identified. IMPRESSION: 1. No biliary duct dilatation or definite choledocholithiasis identified. Limited study due to motion. 2. Extensive irregular signal abnormalities in the right lower lobe suggesting pneumonia and/or atelectasis. Small right pleural effusion. 3. 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 Electronically Signed   By: Ofilia Neas M.D.   On: 11/01/2021 15:05   DG Chest Port 1 View  Result Date: 11/03/2021 CLINICAL DATA:  Right IJ catheter placement EXAM: PORTABLE CHEST 1 VIEW COMPARISON:  10/25/2021 FINDINGS: Right jugular dual lumen central venous catheter with the tip projecting over the upper SVC. Right lower lobe airspace disease which has increased compared with the prior examination and may involve the right upper lobe. Small right pleural effusion. No pneumothorax. Stable cardiomegaly. Dual lead cardiac pacemaker. No acute osseous abnormality. IMPRESSION: 1. Right jugular dual lumen central venous catheter with the tip projecting over the upper SVC. 2. Persistent right lower lobe airspace disease concerning for pneumonia with possible involvement of the right upper lobe. Electronically Signed   By: Kathreen Devoid M.D.   On: 11/03/2021 09:17   HYBRID OR IMAGING ([facility] ONLY)  Result Date: 11/03/2021 There is no interpretation for this exam.  This order is for images obtained during a surgical procedure.  Please See "Surgeries" Tab for more information regarding the procedure.    Scheduled Meds:  sodium chloride   Intravenous Once   apixaban  5 mg Oral BID   benzonatate  200 mg Oral TID   calcitRIOL  0.5 mcg Oral Q T,Th,Sa-HD   Chlorhexidine Gluconate Cloth  6 each  Topical Daily   darbepoetin (ARANESP) injection - DIALYSIS  200 mcg Intravenous Q Fri-HD   diltiazem  120 mg Oral Daily   feeding supplement (NEPRO CARB STEADY)  237 mL Oral BID BM   fluticasone  2 spray Each Nare Daily   [START ON 11/05/2021] furosemide  80 mg Oral Once per day on Sun Mon Wed Fri  insulin aspart  0-5 Units Subcutaneous QHS   insulin aspart  0-9 Units Subcutaneous TID WC   insulin glargine-yfgn  18 Units Subcutaneous Daily   lidocaine  1 patch Transdermal Q24H   mouth rinse  15 mL Mouth Rinse BID   metoprolol tartrate  50 mg Oral BID   multivitamin  1 tablet Oral QHS   vancomycin variable dose per unstable renal function (pharmacist dosing)   Does not apply See admin instructions   Continuous Infusions:  sodium chloride 10 mL/hr at 11/03/21 0730   sodium chloride     sodium chloride     ceFEPime (MAXIPIME) IV 1 g (11/02/21 2138)     LOS: 9 days   Time spent:35 minutes.  Patrecia Pour, MD [facility] [email or web address] 11/03/2021, 12:47 PM

## 2021-11-03 NOTE — Progress Notes (Signed)
Occupational Therapy Treatment Patient Details Name: Alexander [name] MRN: [id number] DOB: [birth year] Today's Date: 11/03/2021   History of present illness 60 y.o. male presents to ED 12/14 with c/o increasing SoB and cough. Found to be in Afib with RVR, HR to 140s, hypoxic down in the 80%O2 on RA. Chest x-ray showing right basilar pneumonia with resolved left upper lobe pneumonia. 12/23 S/p RIJ TDC placement. PMH: ESRD on HD TTS, paroxysmal atrial fibrillation on Eliquis, pacemaker, CAD s/p stent, diastolic CHF, peripheral vascular disease with right BKA, history of GI bleed, type 2 diabetes   OT comments  Able to progress to EOB and use a lateral scoot transfer from bed<> drop arm recliner with minguard A and increased time. Pt with decreased awareness of how the blister on his R residual limb affects his ability to wear his prosthetic leg and how this impacts his ability to stand and pivot to his chair. Spoke with daughter [name]) who stated her Dad has been wearing his prosthetic leg to transfer into the car to go to dialysis.  An unstageable wound was noted on his L heel - nsg and MD made aware. Recommend wound care consult to further assess L heel wound, ability to weight bear through L foot for transfers and further assess Blister on  R residual limb. Unsure if pt should be pivoting on L heel given wound/concern for shear and may need to focus on slide board transfers. Recommend prevalon boot L heel while in bed.   Pt will need to follow up with [facility] to assess prosthetic leg/correct fit and assess fit for a specialized shoe L foot. If pt is able to continue to progress and complete ADL./mobility at lateral scoot/transfer board level, may be able to DC home with [facility] services. If more assistance is needed, may need SNF.    Recommendations for follow up therapy are one component of a multi-disciplinary discharge planning process, led by the attending physician.  Recommendations may be updated  based on patient status, additional functional criteria and insurance authorization.    Follow Up Recommendations  Home health OT (pending progress)    Assistance Recommended at Discharge Frequent or constant Supervision/Assistance  Equipment Recommendations  BSC/3in1 (drop arm; transfer board)    Recommendations for Other Services  Wound Care consult    Precautions / Restrictions Precautions Precautions: Fall Precaution Comments: wound L heel Restrictions Weight Bearing Restrictions: No       Mobility Bed Mobility Overal bed mobility: Needs Assistance Bed Mobility: Supine to Sit     Supine to sit: Supervision          Transfers Overall transfer level: Needs assistance   Transfers: Bed to chair/wheelchair/BSC            Lateral/Scoot Transfers: Min guard General transfer comment: minguard bed<> drop arm recliner     Balance Overall balance assessment: Needs assistance   Sitting balance-Leahy Scale: Fair                                     ADL either performed or assessed with clinical judgement   ADL Overall ADL's : Needs assistance/impaired                                       General ADL Comments: Able to complete bathing/dressingvel rolling side/side  Extremity/Trunk Assessment Upper Extremity Assessment Upper Extremity Assessment: Generalized weakness   Lower Extremity Assessment RLE Deficits / Details: blister on end of residual limb LLE Deficits / Details: L transmet amputation; unstageable wound L heel/purlple; intact - nsg notified; heel elevated form bed        Vision       Perception     Praxis      Cognition Arousal/Alertness: Awake/alert Behavior During Therapy: Flat affect;Impulsive (impulsive at times; most likely baseline) Overall Cognitive Status: No family/caregiver present to determine baseline cognitive functioning                           Safety/Judgement: Decreased  awareness of safety;Decreased awareness of deficits Awareness: Emergent Problem Solving: Slow processing            Exercises     Shoulder Instructions       General Comments increased HR into 130s with increased mobility    Pertinent Vitals/ Pain       Pain Assessment: Faces Faces Pain Scale: Hurts little more Pain Location: back with movement Pain Descriptors / Indicators: Grimacing;Guarding;Moaning Pain Intervention(s): Limited activity within patient's tolerance  Home Living                                          Prior Functioning/Environment              Frequency  Min 2X/week        Progress Toward Goals  OT Goals(current goals can now be found in the care plan section)  Progress towards OT goals: Progressing toward goals  Acute Rehab OT Goals Patient Stated Goal: to go home OT Goal Formulation: With patient Time For Goal Achievement: 11/14/21 Potential to Achieve Goals: Good ADL Goals Pt Will Perform Lower Body Dressing: with min guard assist;sitting/lateral leans Pt Will Transfer to Toilet: with min guard assist;with transfer board Additional ADL Goal #1: Pt will complete multistep cognitive task with <3 errors. Additional ADL Goal #2: Pt will demonstrate anticipatory awareness during ADL/IADL and functional mobiltiy.  Plan Discharge plan needs to be updated    Co-evaluation                 AM-PAC OT "6 Clicks" Daily Activity     Outcome Measure   Help from another person eating meals?: None Help from another person taking care of personal grooming?: A Little Help from another person toileting, which includes using toliet, bedpan, or urinal?: A Lot Help from another person bathing (including washing, rinsing, drying)?: A Little Help from another person to put on and taking off regular upper body clothing?: A Little Help from another person to put on and taking off regular lower body clothing?: A Little 6 Click  Score: 18    End of Session Equipment Utilized During Treatment: Gait belt  OT Visit Diagnosis: Other abnormalities of gait and mobility (R26.89);Muscle weakness (generalized) (M62.81);Pain Pain - part of body:  (back)   Activity Tolerance Patient tolerated treatment well   Patient Left in bed;with call bell/phone within reach;with bed alarm set   Nurse Communication Mobility status;Other (comment) (need to assess wound L heel)        Time: 0093-8182 OT Time Calculation (min): 29 min  Charges: OT General Charges $OT Visit: 1 Visit OT Treatments $Self Care/Home Management : 23-37 mins  Deidre Ala  Arlyn Bumpus, OT/L   Acute OT Clinical Specialist Acute Rehabilitation Services Pager [phone] Office [phone]   [facility] 11/03/2021, 12:55 PM

## 2021-11-03 NOTE — Progress Notes (Signed)
Orthopedic Tech Progress Note Patient Details:  Alexander [name] Jan 29, 1961 [id number]  Patient ID: Alexander [name], male   DOB: Nov 08, 1961, 60 y.o.   MRN: [id number]  [name] 11/03/2021, 1:39 PM Spoke with nurse about getting that prevlon boot from materials.

## 2021-11-03 NOTE — Interval H&P Note (Signed)
History and Physical Interval Note:  11/03/2021 7:03 AM  Alexander [name]  has presented today for surgery, with the diagnosis of ESRD.  The various methods of treatment have been discussed with the patient and family. After consideration of risks, benefits and other options for treatment, the patient has consented to  Procedure(s): INSERTION OF TUNNELED DIALYSIS CATHETER (N/A) as a surgical intervention.  The patient's history has been reviewed, patient examined, no change in status, stable for surgery.  I have reviewed the patient's chart and labs.  Questions were answered to the patient's satisfaction.     [name]

## 2021-11-03 NOTE — Transfer of Care (Addendum)
Immediate Anesthesia Transfer of Care Note  Patient: Alexander [name]  Procedure(s) Performed: RIGHT INTERNAL JUGULAR INSERTION OF TUNNELED DIALYSIS CATHETER (Right: Neck)  Patient Location: PACU  Anesthesia Type:General  Level of Consciousness: awake, alert  and oriented  Airway & Oxygen Therapy: Patient Spontanous Breathing  Post-op Assessment: Report given to RN and Post -op Vital signs reviewed and stable  Post vital signs: Reviewed and stable  Last Vitals:  Vitals Value Taken Time  BP 112/73 11/03/21 0923  Temp 36.6 C 11/03/21 0917  Pulse 90 11/03/21 1029  Resp 15 11/03/21 1035  SpO2 98 % 11/03/21 1029  Vitals shown include unvalidated device data.  Last Pain:  Vitals:   11/03/21 0917  TempSrc: Oral  PainSc:       Patients Stated Pain Goal: Other (Comment) (unable to say) (16/60/60 0459)  Complications: No notable events documented.

## 2021-11-03 NOTE — Progress Notes (Signed)
Yellow Mews implemented prior. Continuing Q4 vitals.  11/03/21 1218  Assess: MEWS Score  Temp 97.7 F (36.5 C)  BP 129/85  Pulse Rate (!) 104  ECG Heart Rate (!) 102  Resp (!) 22  Assess: MEWS Score  MEWS Temp 0  MEWS Systolic 0  MEWS Pulse 1  MEWS RR 1  MEWS LOC 0  MEWS Score 2  MEWS Score Color Yellow  Assess: if the MEWS score is Yellow or Red  Were vital signs taken at a resting state? Yes  Focused Assessment No change from prior assessment  Early Detection of Sepsis Score *See Row Information* High  MEWS guidelines implemented *See Row Information* Yes  Take Vital Signs  Increase Vital Sign Frequency  Yellow: Q 2hr X 2 then Q 4hr X 2, if remains yellow, continue Q 4hrs  Escalate  MEWS: Escalate Yellow: discuss with charge nurse/RN and consider discussing with provider and RRT  Notify: Charge Nurse/RN  Name of Charge Nurse/RN Notified Brooke, RN  Date Charge Nurse/RN Notified 11/03/21  Time Charge Nurse/RN Notified 1317  Document  Patient Outcome Other (Comment) (stable in room)  Progress note created (see row info) Yes

## 2021-11-03 NOTE — Progress Notes (Signed)
Pharmacy Antibiotic Note  Alexander [name] is a 60 y.o. male admitted on 10/25/2021 with SOB and found to have vegetation on TV close to PPM lead. Pharmacy has been consulted for vancomycin and cefepime dosing. Pt is ESRD on HD TTS, last session was 12/10. Received vancomycin and cefepime doses appropriately after HD session.   Pt received HD cath today with plans for HD later today off schedule. Pre-op vancomycin 1000mg  was given prior to access placement so will defer post-HD vancomycin dose today.   Plan: Continue cefepime 1g qHS while HD off schedule Continue Vancomycin 1000mg  qHD - no dose today as above Follow HD schedule, cultures.   Height: 6' (182.9 cm) Weight: 49.2 kg (108 lb 8 oz) IBW/kg (Calculated) : 77.6  Temp (24hrs), Avg:98 F (36.7 C), Min:97.3 F (36.3 C), Max:98.4 F (36.9 C)  Recent Labs  Lab 10/29/21 0240 10/29/21 0852 10/30/21 1552 10/31/21 0427 11/02/21 0356 11/03/21 0122  WBC 22.0*  --  14.3* 13.9* 11.1* 12.5*  CREATININE  --  5.34* 3.58* 4.15* 4.12* 4.79*     Estimated Creatinine Clearance: 11.1 mL/min (A) (by C-G formula based on SCr of 4.79 mg/dL (H)).    Allergies  Allergen Reactions   Cytoxan [Cyclophosphamide] Other (See Comments)    Pancytopenia   Keflex [Cephalexin] Nausea And Vomiting   Tylenol [Acetaminophen] Other (See Comments)    Makes the patient sweat   Pletal [Cilostazol] Palpitations and Other (See Comments)    "Can hear heart beating loudly".    Antimicrobials this admission: Ceftriaxone x1 12/14  Doxycycline 12/14 >> 12/16 Vancomycin 12/16 >> Cefepime 12/16 >>  Microbiology results: 12/16 BCx: ngtd  Thank you for allowing pharmacy to be a part of this patients care.    Arrie Senate, PharmD, BCPS, [facility] Clinical Pharmacist [phone] Please check AMION for all [place] numbers 11/03/2021

## 2021-11-03 NOTE — Anesthesia Procedure Notes (Signed)
Procedure Name: LMA Insertion Date/Time: 11/03/2021 7:46 AM Performed by: Griffin Dakin, CRNA Pre-anesthesia Checklist: Patient identified, Emergency Drugs available, Suction available and Patient being monitored Patient Re-evaluated:Patient Re-evaluated prior to induction Oxygen Delivery Method: Circle system utilized Preoxygenation: Pre-oxygenation with 100% oxygen Induction Type: IV induction LMA: LMA inserted LMA Size: 5.0 Tube type: Oral Number of attempts: 1 Placement Confirmation: positive ETCO2 and breath sounds checked- equal and bilateral Tube secured with: Tape Dental Injury: Teeth and Oropharynx as per pre-operative assessment

## 2021-11-03 NOTE — Anesthesia Postprocedure Evaluation (Signed)
Anesthesia Post Note  Patient: Alexander [name]  Procedure(s) Performed: RIGHT INTERNAL JUGULAR INSERTION OF TUNNELED DIALYSIS CATHETER (Right: Neck)     Patient location during evaluation: PACU Anesthesia Type: General Level of consciousness: awake and alert Pain management: pain level controlled Vital Signs Assessment: post-procedure vital signs reviewed and stable Respiratory status: spontaneous breathing, nonlabored ventilation and respiratory function stable Cardiovascular status: stable and blood pressure returned to baseline Anesthetic complications: no   No notable events documented.  Last Vitals:  Vitals:   11/03/21 0904 11/03/21 0917  BP: 114/71 112/73  Pulse:  83  Resp: (!) 22 20  Temp: 36.9 C 36.6 C  SpO2: 97%     Last Pain:  Vitals:   11/03/21 0917  TempSrc: Oral  PainSc:                  Audry Pili

## 2021-11-03 NOTE — Op Note (Signed)
° ° °  NAME: Alexander [name]    MRN: [id number] DOB: August 21, 1961    DATE OF OPERATION: 11/03/2021  PREOP DIAGNOSIS:    End-stage renal disease with poorly functioning right upper arm fistula  POSTOP DIAGNOSIS:    Same  PROCEDURE:    Ultrasound-guided placement of right IJ tunneled 23 cm dialysis catheter  SURGEON: Judeth Cornfield. Scot Dock, MD  ASSIST: None  ANESTHESIA: General  EBL: Minimal  INDICATIONS:    [name] is a 60 y.o. male who had a poorly functioning right upper arm fistula.  He had significant infiltrate and I thought that it would be best to rest the fistula.  He presents for placement of a tunneled dialysis catheter.  FINDINGS:   Patent right IJ  TECHNIQUE:   The patient was taken to the operating room and received a general anesthetic.  I looked at the right IJ with the ultrasound and it was patent.  The right neck and upper chest were prepped and draped in the usual sterile fashion.  Of note he had a pacemaker on the left side.  I selected a 23 sonometer catheter.  Under ultrasound guidance the right IJ was cannulated and a micropuncture sheath introduced over a wire.  I then advanced the J-wire into the right atrium.  The exit site with a catheter was selected and a small incision made with an 11 blade.  The catheter was tunneled to the IJ cannulation site and the cuff positioned within the tunnel.  I then dilated the tract over the wire.  The dilator and peel-away sheath were then advanced over the wire and the wire and dilator removed.  The catheter was passed through the peel-away sheath and positioned at the cavoatrial junction.  Both ports withdrew easily were then flushed with heparinized saline and filled with concentrated heparin.  The catheter was secured at its exit site with a 3-0 nylon suture.  The IJ cannulation site was closed with a 4-0 Monocryl.  A sterile dressing was applied.  The patient tolerated the procedure well and was transferred to the  recovery room in stable condition.  All needle and sponge counts were correct.  [name], MD, FACS [facility]  DATE OF DICTATION:   11/03/2021

## 2021-11-03 NOTE — Progress Notes (Signed)
OT Cancellation Note  Patient Details Name: IDRISS QUACKENBUSH MRN: [id number] DOB: May 28, 1961   Cancelled Treatment:    Reason Eval/Treat Not Completed: Patient at procedure or test/ unavailable (in surgery) Will follow up later date. Lucianna Ostlund,HILLARY 11/03/2021, 7:56 AM Maurie Boettcher, OT/L   Acute OT Clinical Specialist Acute Rehabilitation Services Pager [phone] Office [phone]

## 2021-11-03 NOTE — Significant Event (Signed)
HGB 6.6 this AM down from 7.1 yesterday.  Prep 2u PRBC, transfuse 1u.

## 2021-11-03 NOTE — Progress Notes (Signed)
[facility] Progress Note   Subjective:    Seen and examined patient at bedside. S/p RIJ TDC placement with VVS this AM. Required 1u prbc overnight for hgb 6.6. Open for HD today. Other than feeling hungry, no complaints.   Objective Vitals:   11/03/21 0830 11/03/21 0845 11/03/21 0904 11/03/21 0917  BP: 112/61 112/76 114/71 112/73  Pulse: 91 73  83  Resp: (!) 21 17 (!) 22 20  Temp: (!) 97.3 F (36.3 C)  98.4 F (36.9 C) 97.8 F (36.6 C)  TempSrc:    Oral  SpO2: 94% 92% 97%   Weight:      Height:       Physical Exam General: Well-appearing; NAD Heart: Irregular; No murmurs, gallops, or rubs Lungs: Clear in uppers and laterally; No wheezing, rales, or rhonchi Abdomen: Soft, non-tender, active bowel sounds Extremities: Noted edema BL thighs and LLE; no myoclonic jerking noted Dialysis Access: R AVF (+) Bruit/Thrill but very bruised; noted hematoma. RIJ TDC in place  [facility] Weights   11/01/21 0500 11/02/21 0428 11/03/21 0357  Weight: 107.7 kg 109.9 kg 49.2 kg    Intake/Output Summary (Last 24 hours) at 11/03/2021 1041 Last data filed at 11/03/2021 0830 Gross per 24 hour  Intake 2111.13 ml  Output 5 ml  Net 2106.13 ml    Additional Objective Labs: Basic Metabolic Panel: Recent Labs  Lab 10/31/21 0427 11/02/21 0356 11/03/21 0122  NA 131* 129* 130*  K 4.1 3.6 3.6  CL 96* 94* 96*  CO2 25 23 26   GLUCOSE 283* 241* 186*  BUN 41* 36* 45*  CREATININE 4.15* 4.12* 4.79*  CALCIUM 7.6* 7.5* 7.8*  PHOS  --  4.7* 5.2*   Liver Function Tests: Recent Labs  Lab 10/29/21 0852 10/30/21 1552 10/31/21 0427 11/02/21 0356 11/03/21 0122  AST 180* 71* 42*  --   --   ALT 174* 115* 87*  --   --   ALKPHOS 80 74 65  --   --   BILITOT 1.8* 1.9* 1.9*  --   --   PROT 5.5* 5.4* 5.3*  --   --   ALBUMIN 1.8* 1.7* 1.6* 1.6* 1.6*   No results for input(s): LIPASE, AMYLASE in the last 168 hours. CBC: Recent Labs  Lab 10/29/21 0240 10/30/21 1552 10/31/21 0427  11/02/21 0356 11/03/21 0122  WBC 22.0* 14.3* 13.9* 11.1* 12.5*  HGB 8.0* 6.7* 7.8* 7.1* 6.6*  HCT 27.4* 22.1* 25.0* 23.5* 22.3*  MCV 91.6 89.1 88.0 90.7 91.4  PLT 153 143* 107* 89* 89*   Blood Culture    Component Value Date/Time   SDES BLOOD LEFT ARM 10/27/2021 1036   SPECREQUEST  10/27/2021 1036    BOTTLES DRAWN AEROBIC AND ANAEROBIC Blood Culture adequate volume   CULT  10/27/2021 1036    NO GROWTH 5 DAYS Performed at [facility], [place] [address], [place], [place] [address]    REPTSTATUS 11/01/2021 FINAL 10/27/2021 1036    Cardiac Enzymes: No results for input(s): CKTOTAL, CKMB, CKMBINDEX, TROPONINI in the last 168 hours. CBG: Recent Labs  Lab 11/02/21 1607 11/02/21 2149 11/03/21 0641 11/03/21 0831 11/03/21 0924  GLUCAP 210* 168* 165* 156* 176*   Iron Studies:  Recent Labs    11/01/21 1810  IRON 30*  TIBC NOT CALCULATED   Lab Results  Component Value Date   INR 3.0 (H) 10/30/2021   INR 1.2 07/22/2020   INR 1.2 05/26/2020   Studies/Results: MR ABDOMEN MRCP WO CONTRAST  Result Date:  11/01/2021 CLINICAL DATA:  Right upper quadrant pain EXAM: MRI ABDOMEN WITHOUT CONTRAST  (INCLUDING MRCP) TECHNIQUE: Multiplanar multisequence MR imaging of the abdomen was performed. Heavily T2-weighted images of the biliary and pancreatic ducts were obtained, and three-dimensional MRCP images were rendered by post processing. COMPARISON:  CT abdomen and pelvis 09/16/2021, abdominal ultrasound 10/29/2021 FINDINGS: Study is limited due to motion. Lower chest: Small right pleural effusion. Extensive irregular signal abnormalities in the visualized right lower lobe. Hepatobiliary: Liver is normal in size in contour with no suspicious mass identified. Hepatic parenchyma demonstrates diffuse T2 hypointensity as well as relatively increased signal intensity on out of phase T1, consistent with hemosiderosis. Gallbladder is moderately distended with no significant wall thickening or  pericholecystic edema visualized. No cholelithiasis identified. No biliary ductal dilatation or definite choledocholithiasis, given limitations of motion. Common bile duct measures up to 6 mm in diameter. Pancreas: Appears grossly normal with no definite mass or ductal dilatation. Limited visualization. Spleen: Upper normal size. Diffuse hypointense T2 signal also consistent with hemosiderosis. Adrenals/Urinary Tract: Adrenal glands appear normal. A few tiny hyperintense T2 signal likely cysts in the right kidney. No hydronephrosis visualized. Stomach/Bowel: Visualized portions within the abdomen are unremarkable. Vascular/Lymphatic: No pathologically enlarged lymph nodes identified. No abdominal aortic aneurysm demonstrated. Other:  No ascites identified. Musculoskeletal: No suspicious bony lesions identified. IMPRESSION: 1. No biliary duct dilatation or definite choledocholithiasis identified. Limited study due to motion. 2. Extensive irregular signal abnormalities in the right lower lobe suggesting pneumonia and/or atelectasis. Small right pleural effusion. 3. 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 Electronically Signed   By: Ofilia Neas M.D.   On: 11/01/2021 15:05   DG Chest Port 1 View  Result Date: 11/03/2021 CLINICAL DATA:  Right IJ catheter placement EXAM: PORTABLE CHEST 1 VIEW COMPARISON:  10/25/2021 FINDINGS: Right jugular dual lumen central venous catheter with the tip projecting over the upper SVC. Right lower lobe airspace disease which has increased compared with the prior examination and may involve the right upper lobe. Small right pleural effusion. No pneumothorax. Stable cardiomegaly. Dual lead cardiac pacemaker. No acute osseous abnormality. IMPRESSION: 1. Right jugular dual lumen central venous catheter with the tip projecting over the upper SVC. 2. Persistent right lower lobe airspace disease concerning for pneumonia with possible involvement of the right upper  lobe. Electronically Signed   By: Kathreen Devoid M.D.   On: 11/03/2021 09:17   HYBRID OR IMAGING ([facility] ONLY)  Result Date: 11/03/2021 There is no interpretation for this exam.  This order is for images obtained during a surgical procedure.  Please See "Surgeries" Tab for more information regarding the procedure.    Medications:  sodium chloride 10 mL/hr at 11/03/21 0730   sodium chloride     sodium chloride     ceFEPime (MAXIPIME) IV 1 g (11/02/21 2138)    sodium chloride   Intravenous Once   apixaban  5 mg Oral BID   benzonatate  200 mg Oral TID   calcitRIOL  0.5 mcg Oral Q T,Th,Sa-HD   Chlorhexidine Gluconate Cloth  6 each Topical Daily   darbepoetin (ARANESP) injection - DIALYSIS  200 mcg Intravenous Q Fri-HD   diltiazem  120 mg Oral Daily   feeding supplement (NEPRO CARB STEADY)  237 mL Oral BID BM   fluticasone  2 spray Each Nare Daily   [START ON 11/05/2021] furosemide  80 mg Oral Once per day on Sun Mon Wed Fri   insulin aspart  0-5 Units Subcutaneous QHS  insulin aspart  0-9 Units Subcutaneous TID WC   insulin glargine-yfgn  18 Units Subcutaneous Daily   lidocaine  1 patch Transdermal Q24H   mouth rinse  15 mL Mouth Rinse BID   metoprolol tartrate  50 mg Oral BID   multivitamin  1 tablet Oral QHS   vancomycin variable dose per unstable renal function (pharmacist dosing)   Does not apply See admin instructions    Dialysis Orders: GO TTS  4h 18min  108.5kg   400/800   2/2.5 bath  TDC   AVF RUA Hep none  - mircera 225 last 12/1 - calcitriol 0.5 ug tiw   Assessment/Plan: PNA - w/ cough, mild SOB, RLL infiltrate on CXR.  On antibiotics.  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 Spoke with Pharmacy-plan for patient to receive Vancomycin 1g and Cefepime 2g with HD (TTS) until 12/08/21. Repeat blood cultures (-) to date.  Cardiology following  ESRD - on HD TTS. Currently off schedule. Plan for HD  11/03/21 2nd shift today. Next HD likely tomorrow to get him back on schedule (depending on inpatient census) Vascular Access- Poorly functioning RUE AVF (infiltrated w/ hematoma). S/p RIJ TDC placement 12/23. Plan is to give his AVF rest Myoclonic Jerking- Ongoing. This has been ongoing for few months prior to admission. Doesn't appear to be d/t uremia. Currently not on Gabapentin. LFTs elevated. Current Ammonia level WNL. He will need outpatient neurologic workup. Volume 1+ edema bilateral thighs and LLE-probably losing body wt (hx poor appetite for 1-2 mos). Will have lower dry weight at discharge. UF as tolerated HTN - BP trend soft but stable currently. Takes only metropolol, which may be for afib Afib/ RVR - Rate controlled, on BB, Cardizem, A/C per cardiology  Echo 12/15 -- EF 45%,  Large tricuspid valve veg , severe TVR - not a good candidate for surgery per Dr. [name]  DM2 - on insulin, per primary Anemia ckd -S/p 1 unit PRBCs 12/19. Will re-dose ESA to receive with HD 12/23. On Heparin gtt. S/p 1u prbc 12/23. Transfused prn. Avoiding Fe given #1 and #2 MBD ckd - Corr Ca/Phos ok.  cont vdra  Gean Quint, MD [facility]

## 2021-11-04 ENCOUNTER — Encounter (HOSPITAL_COMMUNITY): Payer: Self-pay | Admitting: Vascular Surgery

## 2021-11-04 LAB — CBC
HCT: 24.8 % — ABNORMAL LOW (ref 39.0–52.0)
Hemoglobin: 7.5 g/dL — ABNORMAL LOW (ref 13.0–17.0)
MCH: 28 pg (ref 26.0–34.0)
MCHC: 30.2 g/dL (ref 30.0–36.0)
MCV: 92.5 fL (ref 80.0–100.0)
Platelets: 90 10*3/uL — ABNORMAL LOW (ref 150–400)
RBC: 2.68 MIL/uL — ABNORMAL LOW (ref 4.22–5.81)
RDW: 22.5 % — ABNORMAL HIGH (ref 11.5–15.5)
WBC: 10.8 10*3/uL — ABNORMAL HIGH (ref 4.0–10.5)
nRBC: 0.2 % (ref 0.0–0.2)

## 2021-11-04 LAB — GLUCOSE, CAPILLARY
Glucose-Capillary: 255 mg/dL — ABNORMAL HIGH (ref 70–99)
Glucose-Capillary: 200 mg/dL — ABNORMAL HIGH (ref 70–99)
Glucose-Capillary: 132 mg/dL — ABNORMAL HIGH (ref 70–99)
Glucose-Capillary: 165 mg/dL — ABNORMAL HIGH (ref 70–99)

## 2021-11-04 LAB — RENAL FUNCTION PANEL
Albumin: 1.7 g/dL — ABNORMAL LOW (ref 3.5–5.0)
Anion gap: 9 (ref 5–15)
BUN: 28 mg/dL — ABNORMAL HIGH (ref 8–23)
CO2: 26 mmol/L (ref 22–32)
Calcium: 7.5 mg/dL — ABNORMAL LOW (ref 8.9–10.3)
Chloride: 97 mmol/L — ABNORMAL LOW (ref 98–111)
Creatinine, Ser: 3.37 mg/dL — ABNORMAL HIGH (ref 0.61–1.24)
GFR, Estimated: 20 mL/min — ABNORMAL LOW (ref >60)
Glucose, Bld: 240 mg/dL — ABNORMAL HIGH (ref 70–99)
Phosphorus: 4.2 mg/dL (ref 2.5–4.6)
Potassium: 4 mmol/L (ref 3.5–5.1)
Sodium: 132 mmol/L — ABNORMAL LOW (ref 135–145)

## 2021-11-04 LAB — PREPARE RBC (CROSSMATCH)

## 2021-11-04 MED ORDER — METOPROLOL TARTRATE 25 MG PO TABS
25.0000 mg | ORAL_TABLET | Freq: Two times a day (BID) | ORAL | Status: DC
  Administered 2021-11-04 – 2021-11-06 (×4): 25 mg via ORAL
  Filled 2021-11-04 (×4): qty 1

## 2021-11-04 MED ORDER — ALBUMIN HUMAN 25 % IV SOLN
25.0000 g | Freq: Once | INTRAVENOUS | Status: AC
  Administered 2021-11-04: 20:00:00 25 g via INTRAVENOUS
  Filled 2021-11-04: qty 100

## 2021-11-04 MED ORDER — INSULIN ASPART 100 UNIT/ML IJ SOLN
2.0000 [IU] | Freq: Three times a day (TID) | INTRAMUSCULAR | Status: DC
  Administered 2021-11-04 – 2021-11-06 (×6): 2 [IU] via SUBCUTANEOUS

## 2021-11-04 MED ORDER — VANCOMYCIN HCL IN DEXTROSE 1-5 GM/200ML-% IV SOLN
1000.0000 mg | INTRAVENOUS | Status: DC
  Administered 2021-11-04: 1000 mg via INTRAVENOUS
  Filled 2021-11-04 (×2): qty 200

## 2021-11-04 NOTE — Progress Notes (Signed)
[facility] Progress Note   Subjective: Has had HD two days in row, HD again today to resume T,Th,S schedule. He isn't happy but agrees to tx.  Objective Vitals:   11/03/21 1957 11/04/21 0021 11/04/21 0522 11/04/21 0824  BP: (!) 121/56 110/61 112/67 109/65  Pulse: (!) 107 88 91 96  Resp: 19 17 17    Temp: 98.3 F (36.8 C) 98.2 F (36.8 C) 98 F (36.7 C)   TempSrc: Oral Oral Oral   SpO2:  97%    Weight:   109.5 kg   Height:       Physical Exam General: Chronically ill appearing male in NAD Heart:S1,S2 irreg. No M/R/G Lungs: CTAB Abdomen: Obese,NABS Extremities: R BKA with blister on end of stump. Edema thighs, LLE.  Neuro: No myoclonus today Dialysis Access: R AVF bruised +T/B RIJ TDC Drsg CDI   Additional Objective Labs: Basic Metabolic Panel: Recent Labs  Lab 11/02/21 0356 11/03/21 0122 11/04/21 0428  NA 129* 130* 132*  K 3.6 3.6 4.0  CL 94* 96* 97*  CO2 23 26 26   GLUCOSE 241* 186* 240*  BUN 36* 45* 28*  CREATININE 4.12* 4.79* 3.37*  CALCIUM 7.5* 7.8* 7.5*  PHOS 4.7* 5.2* 4.2   Liver Function Tests: Recent Labs  Lab 10/29/21 0852 10/30/21 1552 10/31/21 0427 11/02/21 0356 11/03/21 0122 11/04/21 0428  AST 180* 71* 42*  --   --   --   ALT 174* 115* 87*  --   --   --   ALKPHOS 80 74 65  --   --   --   BILITOT 1.8* 1.9* 1.9*  --   --   --   PROT 5.5* 5.4* 5.3*  --   --   --   ALBUMIN 1.8* 1.7* 1.6* 1.6* 1.6* 1.7*   No results for input(s): LIPASE, AMYLASE in the last 168 hours. CBC: Recent Labs  Lab 10/30/21 1552 10/31/21 0427 11/02/21 0356 11/03/21 0122 11/03/21 1049 11/04/21 0428  WBC 14.3* 13.9* 11.1* 12.5*  --  10.8*  HGB 6.7* 7.8* 7.1* 6.6* 8.0* 7.5*  HCT 22.1* 25.0* 23.5* 22.3* 27.0* 24.8*  MCV 89.1 88.0 90.7 91.4  --  92.5  PLT 143* 107* 89* 89*  --  90*   Blood Culture    Component Value Date/Time   SDES BLOOD LEFT ARM 10/27/2021 1036   SPECREQUEST  10/27/2021 1036    BOTTLES DRAWN AEROBIC AND ANAEROBIC Blood  Culture adequate volume   CULT  10/27/2021 1036    NO GROWTH 5 DAYS Performed at [facility], [place] [address], [place], [place] [address]    REPTSTATUS 11/01/2021 FINAL 10/27/2021 1036    Cardiac Enzymes: No results for input(s): CKTOTAL, CKMB, CKMBINDEX, TROPONINI in the last 168 hours. CBG: Recent Labs  Lab 11/03/21 0924 11/03/21 1216 11/03/21 1717 11/03/21 2130 11/04/21 0800  GLUCAP 176* 211* 126* 158* 255*   Iron Studies:  Recent Labs    11/01/21 1810  IRON 30*  TIBC NOT CALCULATED   @[email or web address] @ Studies/Results: DG Chest Port 1 View  Result Date: 11/03/2021 CLINICAL DATA:  Right IJ catheter placement EXAM: PORTABLE CHEST 1 VIEW COMPARISON:  10/25/2021 FINDINGS: Right jugular dual lumen central venous catheter with the tip projecting over the upper SVC. Right lower lobe airspace disease which has increased compared with the prior examination and may involve the right upper lobe. Small right pleural effusion. No pneumothorax. Stable cardiomegaly. Dual lead cardiac pacemaker. No acute osseous abnormality. IMPRESSION: 1. Right jugular  dual lumen central venous catheter with the tip projecting over the upper SVC. 2. Persistent right lower lobe airspace disease concerning for pneumonia with possible involvement of the right upper lobe. Electronically Signed   By: Kathreen Devoid M.D.   On: 11/03/2021 09:17   HYBRID OR IMAGING ([facility] ONLY)  Result Date: 11/03/2021 There is no interpretation for this exam.  This order is for images obtained during a surgical procedure.  Please See "Surgeries" Tab for more information regarding the procedure.   Medications:  sodium chloride 10 mL/hr at 11/03/21 0730   sodium chloride     sodium chloride     ceFEPime (MAXIPIME) IV 1 g (11/03/21 2232)   vancomycin      sodium chloride   Intravenous Once   apixaban  5 mg Oral BID   benzonatate  200 mg Oral TID   calcitRIOL  0.5 mcg Oral Q T,Th,Sa-HD   Chlorhexidine Gluconate Cloth  6 each  Topical Daily   darbepoetin (ARANESP) injection - DIALYSIS  200 mcg Intravenous Q Fri-HD   diltiazem  120 mg Oral Daily   feeding supplement (NEPRO CARB STEADY)  237 mL Oral BID BM   fluticasone  2 spray Each Nare Daily   [START ON 11/05/2021] furosemide  80 mg Oral Once per day on Sun Mon Wed Fri   insulin aspart  0-5 Units Subcutaneous QHS   insulin aspart  0-9 Units Subcutaneous TID WC   insulin glargine-yfgn  18 Units Subcutaneous Daily   lidocaine  1 patch Transdermal Q24H   mouth rinse  15 mL Mouth Rinse BID   metoprolol tartrate  50 mg Oral BID   multivitamin  1 tablet Oral QHS   vancomycin variable dose per unstable renal function (pharmacist dosing)   Does not apply See admin instructions   Dialysis Orders: GO TTS  4h 30min  108.5kg   400/800   2/2.5 bath  TDC   AVF RUA  -No Heparin -Mircera 225 mcg IV q 2 weeks, last 12/1 -Calcitriol 0.5 mcg PO TIW   Assessment/Plan: PNA - w/ cough, mild SOB, RLL infiltrate on CXR.  On antibiotics.  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 Spoke with Pharmacy-plan for patient to receive Vancomycin 1g and Cefepime 2g with HD (TTS) until 12/08/21. Repeat blood cultures (-) to date.  Cardiology following  Blister R BKA-Will ask primary to consult [name].  ESRD - on HD TTS. Short HD today to resume T,Th,S schedule.  Vascular Access- Poorly functioning RUE AVF (infiltrated w/ hematoma). S/p RIJ TDC placement 12/23. Plan is to give his AVF rest Myoclonic Jerking- Ongoing, seems improved today This has been ongoing for few months prior to admission. Doesn't appear to be d/t uremia. Currently not on Gabapentin. LFTs elevated. Current Ammonia level WNL. He will need outpatient neurologic workup. Volume 1+ edema bilateral thighs and LLE-probably losing body wt (hx poor appetite for 1-2 mos). HD off schedule 12/26 Net UF 3 liters. Will have lower dry weight at discharge. UF  as tolerated HTN - BP low normal and stable. Metoprolol on med list. Decrease dose to 25 mg PO BID.  Afib/ RVR - Rate controlled, on BB, Cardizem, A/C per cardiology  Echo 12/15 -- EF 45%,  Large tricuspid valve veg , severe TVR - not a good candidate for surgery per Dr. [name]  DM2 - on insulin, per primary Anemia ckd -S/p 1 unit PRBCs 12/19. Will  re-dose ESA to receive with HD 12/23. On Heparin gtt. S/p 1u prbc 12/23. Transfused prn. Avoiding Fe given #1 and #2 HGB 7.5 today. Transfuse 1 unit with HD today.  MBD ckd - Corr Ca/Phos ok.  cont vdra     Tyanne Derocher H. Yasmina [place] NP-C 11/04/2021, 11:40 AM  [employer] [phone]

## 2021-11-04 NOTE — Consult Note (Signed)
WOC Nurse Consult Note: Reason for Consult:Left heel with purple discoloration, non blanching. Patient states that it has been present for many months. No pain. Vascular following for right LE. Patient uses left heel to reposition. Wound type:chronic pressure, shear force injury Pressure Injury POA: Yes Measurement:Per Nursing Flow Sheet:  4.5cm x 3.5cm  Wound OXB:[id number] discoloration.  Drainage (amount, consistency, odor) None Periwound:intact Dressing procedure/placement/frequency: I have provided Nursing with topical care guidance using a xeroform gauze (nonadherent, antimicrobial) and a silicone foam for padding to see if this will assist with tissue recovery. A Prevalon boot is provided for use while in bed.  [place] nursing team will not follow, but will remain available to this patient, the nursing and medical teams.  Please re-consult if needed. Thanks, Maudie Flakes, MSN, RN, [place], [facility]  Pager# [phone]

## 2021-11-04 NOTE — Progress Notes (Signed)
PROGRESS NOTE  Alexander [name]  BPZ:[id number] DOB: 1961/11/03 DOA: 10/25/2021 PCP: [name], MD   Brief Narrative: Patient is a 60 year old male with ESRD on HD, TTS, paroxysmal A. fib on Eliquis, pacemaker, CAD status post stent, diastolic CHF, PVD with right BKA, prior history of GI bleed, diabetes mellitus type 2 presented with increasing shortness of breath and cough.  Per patient he was diagnosed with pneumonia in early November and completed course of antibiotics.  However he has not felt back to baseline since.  Has had persistent shortness of breath and coughing, fatigue.  Also noted some anterior and right lateral sided rib pain worse with coughing and worsening left lower extremity edema.  He did complete his HD last session prior to admission. In ED, patient was hypoxic down to 80%, was put on 2 L BNP 1000 181. Chest x-ray showed right basilar pneumonia, he was placed on IV Rocephin and doxycycline.   Assessment & Plan: Principal Problem:   Tricuspid valve vegetation Active Problems:   Diabetes mellitus with end stage renal disease (HCC)   PVD (peripheral vascular disease) (HCC)   CAD (coronary artery disease), native coronary artery   Pacemaker: Medtronic Advisa DR MRI [id number] Dual chamber pacemaker 10/08/2014   ESRD (end stage renal disease) ([place])   Atrial fibrillation with RVR (HCC)   Thrombocytopenia (HCC)   Hyponatremia   Acute on chronic diastolic CHF (congestive heart failure) (HCC)   Transaminitis   Pacemaker infection ([place])   Malnutrition of moderate degree  Acute hypoxic respiratory failure: Due to acute on chronic HFpEF, RLL CAP, PAF with RVR:  - Continue abx, antitussives, pulmonary hygiene  - Wean oxygen as tolerated - Continue abx as below which will treat CAP. Per CT chest 12/16 there is parapneumonic effusion, empyema not excluded, though effusion appears small and pt will be treated with protracted abx course. Recommend repeat chest CT after completion  of treatment. - Incentive spirometry, flutter valve, OOB  ESRD:  - Continue HD per nephrology thru [facility] placed 12/23. HD 12/23, 12/24 to return to usual TTS schedule.   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 No discitis or abscess on CT.  - Risk of worsening infection/embolization with pacemaker removal/reimplantation in setting of active infection is prohibitive, so plan is to treat with IV abx per ID (vancomycin and cefepime after HD thru 12/08/2021) with subsequent transition to oral agent(s). Has [facility] follow up 1/12 with Dr. Gale Journey.   Acute blood loss anemia on anemia of CKD: Required 1u PRBCs 12/19, additional 1u PRBCs 12/23. No active bleeding, but stigmata of considerable prior bleeding/ecchymoses noted. Has required intermittent transfusions of RBCs and iron in the past.  - Ok to convert to eliquis with no ongoing bleeding.  - ESA per nephrology.  - Serial CBC with transfusion threshold 7g/dl, still low. - Transferrin noted to be undetectably low, Ferritin is 2,418 acting as acute phase reactant.  Permanent atrial fibrillation, sinus node dysfunction s/p PPM: Rate is controlled/paced.  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 Stable relatively. Ammonia normal, hepatitis panel negative. Motion-degraded MRCP showed no biliary ductal dilatation or choledocholithiasis. There was mention of hemosiderosis involving the liver and spleen. - Consider liver biopsy after pt is more clinically stable/infection treatment completed. HFE gene mutation testing could also be pursued.  -  Monitor CMP intermittently.  IDT2DM: HbA1c 7.8%.  - DC'ed 70/30 insulin due to hypoglycemia, may not be best option for pt going forward.  - Stable but still some elevations. Continue  sensitive/renal SSI, add mealtime 2u TIDWC. Continue 18u glargine.   Moderate protein calorie malnutrition: Hypoalbuminemia - Supplement protein as able.   PAD: s/p RLE bypass prior to BKA, left fem-TP trunk bypass, 2-stage basilic vein AVF.  - Vascular surgery involved in care as above. No critical limb ischemia currently. - Continue anticoagulation, once LFTs stable, restart statin  Right stump site blisters: Stable, no evidence of infection.  - Advised not to don prosthesis until this heals.   High fall risk with inability to use prosthesis. Discussed at length with OT today. He will need to work with therapy on transfers with attention to his ability to get into his car to go to outpatient dialysis with his current deficits.  - Will continue PT/OT here as much as possible. Pt declines SNF. Hoping for improvement with therapy here sufficient to facilitate home discharge. He does now assent to home health therapies.  Unstageable left heel wound due to shear force and pressure: hx left transmetatarsal amputation as well. High risk for poor wound healing.  - Offload, prevalon boot.  - WOC consulted - Will need proper shoe fitting at [facility] after discharge.   Obesity: Estimated body mass index is 32.74 kg/m as calculated from the following:   Height as of this encounter: 6' (1.829 m).   Weight as of this encounter: 109.5 kg.  DVT prophylaxis: Eliquis Code Status: Full Family Communication: None at bedside. Spoke with spouse by phone at bedside Disposition Plan:  Status is: Inpatient  Remains inpatient appropriate because: Continued inpatient management. Transfusion dependent anemia with reinitiation of anticoagulation. Unsafe DC (currently declining SNF though may not be strong enough to safely go home at this time; will continue PT/OT as much as possible).  Consultants:  Nephrology ID Vascular surgery Cardiology  Subjective: Feels he's getting stronger and should be able to go  home soon. No chest pain or dyspnea. His back pain is stable and severe, requiring analgesics regularly. No active bleeding noted.  Objective: Vitals:   11/03/21 1957 11/04/21 0021 11/04/21 0522 11/04/21 0824  BP: (!) 121/56 110/61 112/67 109/65  Pulse: (!) 107 88 91 96  Resp: 19 17 17    Temp: 98.3 F (36.8 C) 98.2 F (36.8 C) 98 F (36.7 C)   TempSrc: Oral Oral Oral   SpO2:  97%    Weight:   109.5 kg   Height:        Intake/Output Summary (Last 24 hours) at 11/04/2021 1251 Last data filed at 11/04/2021 1025 Gross per 24 hour  Intake 660 ml  Output 3003 ml  Net -2343 ml   Filed Weights   11/03/21 1253 11/03/21 1646 11/04/21 0522  Weight: 111.2 kg 107.4 kg 109.5 kg   Gen: Chronically ill-appearing, pale male in no distress Pulm: Nonlabored breathing room air. Diminished at right base. CV: IRreg irreg without murmur, rub, or gallop. No JVD, no significant dependent edema. GI: Abdomen soft, non-tender, non-distended, with normoactive bowel sounds.  Ext: Warm, no new deformities Skin: No new rashes, lesions or ulcers on visualized skin. Stump site and left heel not reexamined today. HD cath c/d/i Neuro: Alert and oriented. No focal neurological deficits. Psych: Judgement and insight appear fair. Mood euthymic & affect congruent. Behavior is appropriate.    Data Reviewed: I have personally reviewed following labs  and imaging studies  CBC: Recent Labs  Lab 10/30/21 1552 10/31/21 0427 11/02/21 0356 11/03/21 0122 11/03/21 1049 11/04/21 0428  WBC 14.3* 13.9* 11.1* 12.5*  --  10.8*  HGB 6.7* 7.8* 7.1* 6.6* 8.0* 7.5*  HCT 22.1* 25.0* 23.5* 22.3* 27.0* 24.8*  MCV 89.1 88.0 90.7 91.4  --  92.5  PLT 143* 107* 89* 89*  --  90*   Basic Metabolic Panel: Recent Labs  Lab 10/30/21 1552 10/31/21 0427 11/02/21 0356 11/03/21 0122 11/04/21 0428  NA 133* 131* 129* 130* 132*  K 4.2 4.1 3.6 3.6 4.0  CL 96* 96* 94* 96* 97*  CO2 27 25 23 26 26   GLUCOSE 221* 283* 241* 186* 240*   BUN 31* 41* 36* 45* 28*  CREATININE 3.58* 4.15* 4.12* 4.79* 3.37*  CALCIUM 7.9* 7.6* 7.5* 7.8* 7.5*  PHOS  --   --  4.7* 5.2* 4.2   GFR: Estimated Creatinine Clearance: 29.1 mL/min (A) (by C-G formula based on SCr of 3.37 mg/dL (H)). Liver Function Tests: Recent Labs  Lab 10/29/21 0852 10/30/21 1552 10/31/21 0427 11/02/21 0356 11/03/21 0122 11/04/21 0428  AST 180* 71* 42*  --   --   --   ALT 174* 115* 87*  --   --   --   ALKPHOS 80 74 65  --   --   --   BILITOT 1.8* 1.9* 1.9*  --   --   --   PROT 5.5* 5.4* 5.3*  --   --   --   ALBUMIN 1.8* 1.7* 1.6* 1.6* 1.6* 1.7*   No results for input(s): LIPASE, AMYLASE in the last 168 hours. Recent Labs  Lab 10/29/21 0852 10/30/21 1422  AMMONIA 21 20   Coagulation Profile: Recent Labs  Lab 10/30/21 1629  INR 3.0*   Cardiac Enzymes: No results for input(s): CKTOTAL, CKMB, CKMBINDEX, TROPONINI in the last 168 hours. BNP (last 3 results) No results for input(s): PROBNP in the last 8760 hours. HbA1C: No results for input(s): HGBA1C in the last 72 hours. CBG: Recent Labs  Lab 11/03/21 1216 11/03/21 1717 11/03/21 2130 11/04/21 0800 11/04/21 1149  GLUCAP 211* 126* 158* 255* 200*   Lipid Profile: No results for input(s): CHOL, HDL, LDLCALC, TRIG, CHOLHDL, LDLDIRECT in the last 72 hours. Thyroid Function Tests: No results for input(s): TSH, T4TOTAL, FREET4, T3FREE, THYROIDAB in the last 72 hours. Anemia Panel: Recent Labs    11/01/21 1810  TIBC NOT CALCULATED  IRON 30*   Urine analysis:    Component Value Date/Time   COLORURINE YELLOW 05/23/2020 2057   APPEARANCEUR CLEAR 05/23/2020 2057   LABSPEC 1.008 05/23/2020 2057   PHURINE 5.0 05/23/2020 2057   GLUCOSEU 50 (A) 05/23/2020 2057   HGBUR LARGE (A) 05/23/2020 2057   BILIRUBINUR NEGATIVE 05/23/2020 2057   BILIRUBINUR neg 10/01/2014 1445   KETONESUR NEGATIVE 05/23/2020 2057   PROTEINUR 100 (A) 05/23/2020 2057   UROBILINOGEN 0.2 10/01/2014 1445   UROBILINOGEN 0.2  09/30/2014 2051   NITRITE NEGATIVE 05/23/2020 2057   LEUKOCYTESUR NEGATIVE 05/23/2020 2057   Recent Results (from the past 240 hour(s))  Resp Panel by RT-PCR (Flu A&B, Covid) Nasopharyngeal Swab     Status: None   Collection Time: 10/25/21  6:32 PM   Specimen: Nasopharyngeal Swab; Nasopharyngeal(NP) swabs in vial transport medium  Result Value Ref Range Status   SARS Coronavirus 2 by RT PCR NEGATIVE NEGATIVE Final    Comment: (NOTE) SARS-CoV-2 target nucleic acids are NOT DETECTED.  The SARS-CoV-2 RNA is  generally detectable in upper respiratory specimens during the acute phase of infection. The lowest concentration of SARS-CoV-2 viral copies this assay can detect is 138 copies/mL. A negative result does not preclude SARS-Cov-2 infection and should not be used as the sole basis for treatment or other patient management decisions. A negative result may occur with  improper specimen collection/handling, submission of specimen other than nasopharyngeal swab, presence of viral mutation(s) within the areas targeted by this assay, and inadequate number of viral copies(<138 copies/mL). A negative result must be combined with clinical observations, patient history, and epidemiological information. The expected result is Negative.  Fact Sheet for Patients:  EntrepreneurPulse.com.au  Fact Sheet for Healthcare Providers:  IncredibleEmployment.be  This test is no t yet approved or cleared by the [place] FDA and  has been authorized for detection and/or diagnosis of SARS-CoV-2 by FDA under an Emergency Use Authorization (EUA). This EUA will remain  in effect (meaning this test can be used) for the duration of the COVID-19 declaration under Section 564(b)(1) of the Act, 21 U.S.C.section 360bbb-3(b)(1), unless the authorization is terminated  or revoked sooner.       Influenza A by PCR NEGATIVE NEGATIVE Final   Influenza B by PCR NEGATIVE NEGATIVE  Final    Comment: (NOTE) The Xpert Xpress SARS-CoV-2/FLU/RSV plus assay is intended as an aid in the diagnosis of influenza from Nasopharyngeal swab specimens and should not be used as a sole basis for treatment. Nasal washings and aspirates are unacceptable for Xpert Xpress SARS-CoV-2/FLU/RSV testing.  Fact Sheet for Patients: EntrepreneurPulse.com.au  Fact Sheet for Healthcare Providers: IncredibleEmployment.be  This test is not yet approved or cleared by the [place] FDA and has been authorized for detection and/or diagnosis of SARS-CoV-2 by FDA under an Emergency Use Authorization (EUA). This EUA will remain in effect (meaning this test can be used) for the duration of the COVID-19 declaration under Section 564(b)(1) of the Act, 21 U.S.C. section 360bbb-3(b)(1), unless the authorization is terminated or revoked.  Performed at [facility], [place] [address], [place], [place] [address]   Culture, blood (Routine X 2) w Reflex to ID Panel     Status: None   Collection Time: 10/27/21 10:19 AM   Specimen: Left Antecubital; Blood  Result Value Ref Range Status   Specimen Description LEFT ANTECUBITAL  Final   Special Requests   Final    BOTTLES DRAWN AEROBIC AND ANAEROBIC Blood Culture adequate volume   Culture   Final    NO GROWTH 5 DAYS Performed at [facility], [place] [address], [place], [place] [address]    Report Status 11/01/2021 FINAL  Final  Culture, blood (Routine X 2) w Reflex to ID Panel     Status: None   Collection Time: 10/27/21 10:36 AM   Specimen: BLOOD LEFT ARM  Result Value Ref Range Status   Specimen Description BLOOD LEFT ARM  Final   Special Requests   Final    BOTTLES DRAWN AEROBIC AND ANAEROBIC Blood Culture adequate volume   Culture   Final    NO GROWTH 5 DAYS Performed at [facility], [place] [address], [place], [place] [address]    Report Status 11/01/2021 FINAL  Final  Surgical pcr screen      Status: None   Collection Time: 11/03/21  7:00 AM   Specimen: Nasal Mucosa; Nasal Swab  Result Value Ref Range Status   MRSA, PCR NEGATIVE NEGATIVE Final   Staphylococcus aureus NEGATIVE NEGATIVE Final  Comment: (NOTE) The Xpert SA Assay (FDA approved for NASAL specimens in patients 64 years of age and older), is one component of a comprehensive surveillance program. It is not intended to diagnose infection nor to guide or monitor treatment. Performed at [facility], [place] [address] [place], [place] [address]       Radiology Studies: DG Chest Port 1 View  Result Date: 11/03/2021 CLINICAL DATA:  Right IJ catheter placement EXAM: PORTABLE CHEST 1 VIEW COMPARISON:  10/25/2021 FINDINGS: Right jugular dual lumen central venous catheter with the tip projecting over the upper SVC. Right lower lobe airspace disease which has increased compared with the prior examination and may involve the right upper lobe. Small right pleural effusion. No pneumothorax. Stable cardiomegaly. Dual lead cardiac pacemaker. No acute osseous abnormality. IMPRESSION: 1. Right jugular dual lumen central venous catheter with the tip projecting over the upper SVC. 2. Persistent right lower lobe airspace disease concerning for pneumonia with possible involvement of the right upper lobe. Electronically Signed   By: Kathreen Devoid M.D.   On: 11/03/2021 09:17   HYBRID OR IMAGING ([facility] ONLY)  Result Date: 11/03/2021 There is no interpretation for this exam.  This order is for images obtained during a surgical procedure.  Please See "Surgeries" Tab for more information regarding the procedure.    Scheduled Meds:  sodium chloride   Intravenous Once   apixaban  5 mg Oral BID   benzonatate  200 mg Oral TID   calcitRIOL  0.5 mcg Oral Q T,Th,Sa-HD   Chlorhexidine Gluconate Cloth  6 each Topical Daily   darbepoetin (ARANESP) injection - DIALYSIS  200 mcg Intravenous Q Fri-HD   diltiazem  120 mg Oral Daily   feeding  supplement (NEPRO CARB STEADY)  237 mL Oral BID BM   fluticasone  2 spray Each Nare Daily   [START ON 11/05/2021] furosemide  80 mg Oral Once per day on Sun Mon Wed Fri   insulin aspart  0-5 Units Subcutaneous QHS   insulin aspart  0-9 Units Subcutaneous TID WC   insulin glargine-yfgn  18 Units Subcutaneous Daily   lidocaine  1 patch Transdermal Q24H   mouth rinse  15 mL Mouth Rinse BID   metoprolol tartrate  25 mg Oral BID   multivitamin  1 tablet Oral QHS   vancomycin variable dose per unstable renal function (pharmacist dosing)   Does not apply See admin instructions   Continuous Infusions:  sodium chloride 10 mL/hr at 11/03/21 0730   sodium chloride     sodium chloride     ceFEPime (MAXIPIME) IV 1 g (11/03/21 2232)   vancomycin       LOS: 10 days   Time spent: 35 minutes in discussion with patient and family and consultants  Patrecia Pour, MD [facility] [email or web address] 11/04/2021, 12:51 PM

## 2021-11-05 DIAGNOSIS — E440 Moderate protein-calorie malnutrition: Secondary | ICD-10-CM

## 2021-11-05 DIAGNOSIS — T827XXA Infection and inflammatory reaction due to other cardiac and vascular devices, implants and grafts, initial encounter: Secondary | ICD-10-CM

## 2021-11-05 LAB — RENAL FUNCTION PANEL
Albumin: 2 g/dL — ABNORMAL LOW (ref 3.5–5.0)
Anion gap: 10 (ref 5–15)
BUN: 22 mg/dL (ref 8–23)
CO2: 25 mmol/L (ref 22–32)
Calcium: 8 mg/dL — ABNORMAL LOW (ref 8.9–10.3)
Chloride: 98 mmol/L (ref 98–111)
Creatinine, Ser: 2.91 mg/dL — ABNORMAL HIGH (ref 0.61–1.24)
GFR, Estimated: 24 mL/min — ABNORMAL LOW (ref >60)
Glucose, Bld: 218 mg/dL — ABNORMAL HIGH (ref 70–99)
Phosphorus: 3.6 mg/dL (ref 2.5–4.6)
Potassium: 3.6 mmol/L (ref 3.5–5.1)
Sodium: 133 mmol/L — ABNORMAL LOW (ref 135–145)

## 2021-11-05 LAB — GLUCOSE, CAPILLARY
Glucose-Capillary: 226 mg/dL — ABNORMAL HIGH (ref 70–99)
Glucose-Capillary: 238 mg/dL — ABNORMAL HIGH (ref 70–99)
Glucose-Capillary: 119 mg/dL — ABNORMAL HIGH (ref 70–99)
Glucose-Capillary: 149 mg/dL — ABNORMAL HIGH (ref 70–99)

## 2021-11-05 LAB — BPAM RBC
Blood Product Expiration Date: 202301022359
Blood Product Expiration Date: 202301112359
ISSUE DATE / TIME: 202212230605
ISSUE DATE / TIME: 202212241529
Unit Type and Rh: 6200
Unit Type and Rh: 6200

## 2021-11-05 LAB — CBC
HCT: 28.9 % — ABNORMAL LOW (ref 39.0–52.0)
Hemoglobin: 8.7 g/dL — ABNORMAL LOW (ref 13.0–17.0)
MCH: 27.9 pg (ref 26.0–34.0)
MCHC: 30.1 g/dL (ref 30.0–36.0)
MCV: 92.6 fL (ref 80.0–100.0)
Platelets: 93 10*3/uL — ABNORMAL LOW (ref 150–400)
RBC: 3.12 MIL/uL — ABNORMAL LOW (ref 4.22–5.81)
RDW: 21.9 % — ABNORMAL HIGH (ref 11.5–15.5)
WBC: 11.1 10*3/uL — ABNORMAL HIGH (ref 4.0–10.5)
nRBC: 0 % (ref 0.0–0.2)

## 2021-11-05 LAB — TYPE AND SCREEN
ABO/RH(D): A POS
Antibody Screen: NEGATIVE
Unit division: 0
Unit division: 0

## 2021-11-05 MED ORDER — SODIUM CHLORIDE 0.9% FLUSH: 10.0000 mL | Freq: Two times a day (BID) | INTRAVENOUS | Status: DC

## 2021-11-05 MED ORDER — OXYMETAZOLINE HCL 0.05 % NA SOLN
1.0000 | Freq: Two times a day (BID) | NASAL | Status: DC
  Administered 2021-11-05 – 2021-11-06 (×2): 1 via NASAL
  Filled 2021-11-05: qty 30

## 2021-11-05 MED ORDER — SODIUM CHLORIDE 0.9% FLUSH: 10.0000 mL | INTRAVENOUS | Status: DC | PRN

## 2021-11-05 NOTE — Progress Notes (Signed)
Patient refused use of prevalon boot for left heel; nurse educated on purpose and use. Mepilex remains in place. Remains in afib with some AV pacing. HR occasionally up to 130s.

## 2021-11-05 NOTE — Progress Notes (Signed)
PROGRESS NOTE  Alexander [name]  TGY:[id number] DOB: 1960-12-18 DOA: 10/25/2021 PCP: [name], MD   Brief Narrative: Patient is a 60 year old male with ESRD on HD, TTS, paroxysmal A. fib on Eliquis, pacemaker, CAD status post stent, diastolic CHF, PVD with right BKA, prior history of GI bleed, diabetes mellitus type 2 presented with increasing shortness of breath and cough.  Per patient he was diagnosed with pneumonia in early November and completed course of antibiotics.  However he has not felt back to baseline since.  Has had persistent shortness of breath and coughing, fatigue.  Also noted some anterior and right lateral sided rib pain worse with coughing and worsening left lower extremity edema.  He did complete his HD last session prior to admission. In ED, patient was hypoxic down to 80%, was put on 2 L BNP 1000 181. Chest x-ray showed right basilar pneumonia, he was placed on IV Rocephin and doxycycline.   Assessment & Plan: Principal Problem:   Tricuspid valve vegetation Active Problems:   Diabetes mellitus with end stage renal disease (HCC)   PVD (peripheral vascular disease) (HCC)   CAD (coronary artery disease), native coronary artery   Pacemaker: Medtronic Advisa DR MRI [id number] Dual chamber pacemaker 10/08/2014   ESRD (end stage renal disease) ([place])   Atrial fibrillation with RVR (HCC)   Thrombocytopenia (HCC)   Hyponatremia   Acute on chronic diastolic CHF (congestive heart failure) (HCC)   Transaminitis   Pacemaker infection ([place])   Malnutrition of moderate degree  Acute hypoxic respiratory failure: Due to acute on chronic HFpEF, RLL CAP, PAF with RVR: Hypoxia resolved. - Continue antitussives/mucolytics, Incentive spirometry, flutter valve, OOB - Continue abx as below which will treat CAP. Per CT chest 12/16 there is parapneumonic effusion, empyema not excluded, though effusion appears small and pt will be treated with protracted abx course. Recommend repeat chest CT  after completion of treatment.  ESRD:  - Continue HD per nephrology thru [facility] placed 12/23. HD 12/23, 12/24 to return to usual TTS schedule.   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 No discitis or abscess on CT.  - Risk of worsening infection/embolization with pacemaker removal/reimplantation in setting of active infection is prohibitive, so plan is to treat with IV abx per ID (vancomycin and cefepime after HD thru 12/08/2021) with subsequent transition to oral agent(s). Has [facility] follow up 1/12 with Dr. Gale Journey.   Acute blood loss anemia on anemia of CKD: Required 1u PRBCs 12/19, 12/23. No active bleeding, but stigmata of considerable prior bleeding/ecchymoses noted. Has required intermittent transfusions of RBCs and iron in the past.  - Ok to convert to eliquis with no ongoing bleeding.  - ESA per nephrology.  - Serial CBC with transfusion threshold 7g/dl - Transferrin noted to be undetectably low, Ferritin is 2,418 acting as acute phase reactant.  Permanent atrial fibrillation, sinus node dysfunction s/p PPM: Rate is controlled/paced.  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 Stable relatively. Ammonia normal, hepatitis panel negative. Motion-degraded MRCP showed no biliary ductal dilatation or choledocholithiasis. There was mention of hemosiderosis involving the liver and spleen. - Consider liver biopsy after pt is more clinically stable/infection treatment completed. HFE gene mutation testing could also be pursued.  - Monitor CMP intermittently.  IDT2DM: HbA1c 7.8%.  - DC'ed 70/30 insulin due to  hypoglycemia, may not be best option for pt going forward.  - Stable but still some elevations. Continue sensitive/renal SSI, added mealtime 2u TIDWC. Continue 18u  glargine. Will titrate based on trends.  Moderate protein calorie malnutrition: Hypoalbuminemia - Supplement protein as able.   PAD: s/p RLE bypass prior to BKA, left fem-TP trunk bypass, 2-stage basilic vein AVF.  - Vascular surgery involved in care as above. No critical limb ischemia currently. - Continue anticoagulation, once LFTs stable, restart statin  Right stump site blisters: Stable, no evidence of infection.  - Advised not to don prosthesis until this heals. Follow up with [facility].  High fall risk with inability to use prosthesis. Discussed at length with OT today. He will need to work with therapy on transfers with attention to his ability to get into his car to go to outpatient dialysis with his current deficits.  - Will continue PT/OT here as much as possible. Pt declines SNF. Hoping for improvement with therapy here sufficient to facilitate home discharge. He does now assent to home health therapies.  Unstageable left heel wound due to shear force and pressure: hx left transmetatarsal amputation as well. High risk for poor wound healing.  - Offload, prevalon boot.  - WOC consulted - Will need proper shoe fitting at [facility] after discharge.   Obesity: Estimated body mass index is 31.99 kg/m as calculated from the following:   Height as of this encounter: 6' (1.829 m).   Weight as of this encounter: 107 kg.  DVT prophylaxis: Eliquis Code Status: Full Family Communication: None at bedside. Spoke with spouse by phone at bedside 12/24. Disposition Plan:  Status is: Inpatient  Remains inpatient appropriate because: Unsafe DC (currently declining SNF though may not be strong enough to safely go home at this time; will continue PT/OT as much as possible).  Consultants:  Nephrology ID Vascular surgery Cardiology  Subjective: No bleeding. No dyspnea at rest. No new complaints. Hasn't been getting OOB.   Objective: Vitals:   11/04/21 1810 11/04/21 2021  11/04/21 2338 11/05/21 0924  BP: 127/71 125/80 127/83 126/75  Pulse:    (!) 102  Resp: 17 18 18    Temp:  98.6 F (37 C) 98.2 F (36.8 C)   TempSrc:  Oral Oral   SpO2:  99% 98%   Weight:      Height:        Intake/Output Summary (Last 24 hours) at 11/05/2021 1240 Last data filed at 11/05/2021 1000 Gross per 24 hour  Intake 755 ml  Output 1500 ml  Net -745 ml   Filed Weights   11/04/21 0522 11/04/21 1447 11/04/21 1740  Weight: 109.5 kg 108.2 kg 107 kg   Gen: 60 y.o. male in no distress Pulm: Nonlabored breathing room air. Clear, diminished at right base. CV: Irregular without murmur, rub, or gallop. No JVD, no pitting dependent edema. GI: Abdomen soft, non-tender, non-distended, with normoactive bowel sounds.  Ext: Warm, dry right BKA, left TMA with stable blisters, no growing erythema. Skin: No new rashes, lesions or ulcers on visualized skin. Neuro: Alert and oriented. No focal neurological deficits. Psych: Judgement and insight appear fair. Mood euthymic & affect congruent. Behavior is appropriate.    Data Reviewed: I have personally reviewed following labs and imaging studies  CBC: Recent Labs  Lab 10/31/21 0427 11/02/21 0356 11/03/21 0122 11/03/21 1049 11/04/21 0428 11/05/21 0227  WBC 13.9* 11.1* 12.5*  --  10.8* 11.1*  HGB 7.8* 7.1* 6.6* 8.0* 7.5* 8.7*  HCT 25.0* 23.5* 22.3* 27.0* 24.8* 28.9*  MCV 88.0 90.7 91.4  --  92.5 92.6  PLT 107* 89* 89*  --  90* 93*   Basic Metabolic Panel: Recent Labs  Lab 10/31/21 0427 11/02/21 0356 11/03/21 0122 11/04/21 0428 11/05/21 0227  NA 131* 129* 130* 132* 133*  K 4.1 3.6 3.6 4.0 3.6  CL 96* 94* 96* 97* 98  CO2 25 23 26 26 25   GLUCOSE 283* 241* 186* 240* 218*  BUN 41* 36* 45* 28* 22  CREATININE 4.15* 4.12* 4.79* 3.37* 2.91*  CALCIUM 7.6* 7.5* 7.8* 7.5* 8.0*  PHOS  --  4.7* 5.2* 4.2 3.6   GFR: Estimated Creatinine Clearance: 33.3 mL/min (A) (by C-G formula based on SCr of 2.91 mg/dL (H)). Liver Function  Tests: Recent Labs  Lab 10/30/21 1552 10/31/21 0427 11/02/21 0356 11/03/21 0122 11/04/21 0428 11/05/21 0227  AST 71* 42*  --   --   --   --   ALT 115* 87*  --   --   --   --   ALKPHOS 74 65  --   --   --   --   BILITOT 1.9* 1.9*  --   --   --   --   PROT 5.4* 5.3*  --   --   --   --   ALBUMIN 1.7* 1.6* 1.6* 1.6* 1.7* 2.0*   No results for input(s): LIPASE, AMYLASE in the last 168 hours. Recent Labs  Lab 10/30/21 1422  AMMONIA 20   Coagulation Profile: Recent Labs  Lab 10/30/21 1629  INR 3.0*   Cardiac Enzymes: No results for input(s): CKTOTAL, CKMB, CKMBINDEX, TROPONINI in the last 168 hours. BNP (last 3 results) No results for input(s): PROBNP in the last 8760 hours. HbA1C: No results for input(s): HGBA1C in the last 72 hours. CBG: Recent Labs  Lab 11/04/21 1149 11/04/21 1814 11/04/21 2148 11/05/21 0716 11/05/21 1130  GLUCAP 200* 132* 165* 226* 238*   Lipid Profile: No results for input(s): CHOL, HDL, LDLCALC, TRIG, CHOLHDL, LDLDIRECT in the last 72 hours. Thyroid Function Tests: No results for input(s): TSH, T4TOTAL, FREET4, T3FREE, THYROIDAB in the last 72 hours. Anemia Panel: No results for input(s): VITAMINB12, FOLATE, FERRITIN, TIBC, IRON, RETICCTPCT in the last 72 hours.  Urine analysis:    Component Value Date/Time   COLORURINE YELLOW 05/23/2020 2057   APPEARANCEUR CLEAR 05/23/2020 2057   LABSPEC 1.008 05/23/2020 2057   PHURINE 5.0 05/23/2020 2057   GLUCOSEU 50 (A) 05/23/2020 2057   HGBUR LARGE (A) 05/23/2020 2057   BILIRUBINUR NEGATIVE 05/23/2020 2057   BILIRUBINUR neg 10/01/2014 1445   KETONESUR NEGATIVE 05/23/2020 2057   PROTEINUR 100 (A) 05/23/2020 2057   UROBILINOGEN 0.2 10/01/2014 1445   UROBILINOGEN 0.2 09/30/2014 2051   NITRITE NEGATIVE 05/23/2020 2057   LEUKOCYTESUR NEGATIVE 05/23/2020 2057   Recent Results (from the past 240 hour(s))  Culture, blood (Routine X 2) w Reflex to ID Panel     Status: None   Collection Time: 10/27/21  10:19 AM   Specimen: Left Antecubital; Blood  Result Value Ref Range Status   Specimen Description LEFT ANTECUBITAL  Final   Special Requests   Final    BOTTLES DRAWN AEROBIC AND ANAEROBIC Blood Culture adequate volume   Culture   Final    NO GROWTH 5 DAYS Performed at [facility], [place] [address], [place], [place] [address]    Report Status 11/01/2021 FINAL  Final  Culture, blood (Routine X 2)  w Reflex to ID Panel     Status: None   Collection Time: 10/27/21 10:36 AM   Specimen: BLOOD LEFT ARM  Result Value Ref Range Status   Specimen Description BLOOD LEFT ARM  Final   Special Requests   Final    BOTTLES DRAWN AEROBIC AND ANAEROBIC Blood Culture adequate volume   Culture   Final    NO GROWTH 5 DAYS Performed at [facility], [address] [place], [place] [address]    Report Status 11/01/2021 FINAL  Final  Surgical pcr screen     Status: None   Collection Time: 11/03/21  7:00 AM   Specimen: Nasal Mucosa; Nasal Swab  Result Value Ref Range Status   MRSA, PCR NEGATIVE NEGATIVE Final   Staphylococcus aureus NEGATIVE NEGATIVE Final    Comment: (NOTE) The Xpert SA Assay (FDA approved for NASAL specimens in patients 70 years of age and older), is one component of a comprehensive surveillance program. It is not intended to diagnose infection nor to guide or monitor treatment. Performed at [facility], [place] [address], [place], [place] [address]       Radiology Studies: No results found.  Scheduled Meds:  sodium chloride   Intravenous Once   apixaban  5 mg Oral BID   benzonatate  200 mg Oral TID   calcitRIOL  0.5 mcg Oral Q T,Th,Sa-HD   Chlorhexidine Gluconate Cloth  6 each Topical Daily   darbepoetin (ARANESP) injection - DIALYSIS  200 mcg Intravenous Q Fri-HD   diltiazem  120 mg Oral Daily   feeding supplement (NEPRO CARB STEADY)  237 mL Oral BID BM   fluticasone  2 spray Each Nare Daily   furosemide  80 mg Oral Once per day on Sun Mon Wed Fri    insulin aspart  0-5 Units Subcutaneous QHS   insulin aspart  0-9 Units Subcutaneous TID WC   insulin aspart  2 Units Subcutaneous TID WC   insulin glargine-yfgn  18 Units Subcutaneous Daily   lidocaine  1 patch Transdermal Q24H   mouth rinse  15 mL Mouth Rinse BID   metoprolol tartrate  25 mg Oral BID   multivitamin  1 tablet Oral QHS   vancomycin variable dose per unstable renal function (pharmacist dosing)   Does not apply See admin instructions   Continuous Infusions:  sodium chloride 10 mL/hr at 11/03/21 0730   sodium chloride     sodium chloride     ceFEPime (MAXIPIME) IV 1 g (11/04/21 2256)   vancomycin Stopped (11/04/21 1948)     LOS: 11 days   Time spent: 25 minutes today  Patrecia Pour, MD [facility] [email or web address] 11/05/2021, 12:40 PM

## 2021-11-05 NOTE — Progress Notes (Signed)
[facility] Progress Note   Subjective: no acute events, no new complaints. Tolerated HD yesterday, net uf 1.5L  Objective Vitals:   11/04/21 1810 11/04/21 2021 11/04/21 2338 11/05/21 0924  BP: 127/71 125/80 127/83 126/75  Pulse:    (!) 102  Resp: 17 18 18    Temp:  98.6 F (37 C) 98.2 F (36.8 C)   TempSrc:  Oral Oral   SpO2:  99% 98%   Weight:      Height:       Physical Exam General: Chronically ill appearing male in NAD, sitting up in bed Heart:S1,S2 irreg. No M/R/G Lungs: CTAB Abdomen: Obese,NABS Extremities: R BKA with blister on end of stump. Slight left thigh edema Neuro: No myoclonus today Dialysis Access: R AVF bruised +T/B RIJ TDC Drsg CDI   Additional Objective Labs: Basic Metabolic Panel: Recent Labs  Lab 11/03/21 0122 11/04/21 0428 11/05/21 0227  NA 130* 132* 133*  K 3.6 4.0 3.6  CL 96* 97* 98  CO2 26 26 25   GLUCOSE 186* 240* 218*  BUN 45* 28* 22  CREATININE 4.79* 3.37* 2.91*  CALCIUM 7.8* 7.5* 8.0*  PHOS 5.2* 4.2 3.6   Liver Function Tests: Recent Labs  Lab 10/30/21 1552 10/31/21 0427 11/02/21 0356 11/03/21 0122 11/04/21 0428 11/05/21 0227  AST 71* 42*  --   --   --   --   ALT 115* 87*  --   --   --   --   ALKPHOS 74 65  --   --   --   --   BILITOT 1.9* 1.9*  --   --   --   --   PROT 5.4* 5.3*  --   --   --   --   ALBUMIN 1.7* 1.6*   < > 1.6* 1.7* 2.0*   < > = values in this interval not displayed.   No results for input(s): LIPASE, AMYLASE in the last 168 hours. CBC: Recent Labs  Lab 10/31/21 0427 11/02/21 0356 11/03/21 0122 11/03/21 1049 11/04/21 0428 11/05/21 0227  WBC 13.9* 11.1* 12.5*  --  10.8* 11.1*  HGB 7.8* 7.1* 6.6* 8.0* 7.5* 8.7*  HCT 25.0* 23.5* 22.3* 27.0* 24.8* 28.9*  MCV 88.0 90.7 91.4  --  92.5 92.6  PLT 107* 89* 89*  --  90* 93*   Blood Culture    Component Value Date/Time   SDES BLOOD LEFT ARM 10/27/2021 1036   SPECREQUEST  10/27/2021 1036    BOTTLES DRAWN AEROBIC AND ANAEROBIC Blood  Culture adequate volume   CULT  10/27/2021 1036    NO GROWTH 5 DAYS Performed at [facility], [place] [address], [place], [place] [address]    REPTSTATUS 11/01/2021 FINAL 10/27/2021 1036    Cardiac Enzymes: No results for input(s): CKTOTAL, CKMB, CKMBINDEX, TROPONINI in the last 168 hours. CBG: Recent Labs  Lab 11/04/21 0800 11/04/21 1149 11/04/21 1814 11/04/21 2148 11/05/21 0716  GLUCAP 255* 200* 132* 165* 226*   Iron Studies:  No results for input(s): IRON, TIBC, TRANSFERRIN, FERRITIN in the last 72 hours.  @lablastinr3 @ Studies/Results: No results found. Medications:  sodium chloride 10 mL/hr at 11/03/21 0730   sodium chloride     sodium chloride     ceFEPime (MAXIPIME) IV 1 g (11/04/21 2256)   vancomycin Stopped (11/04/21 1948)    sodium chloride   Intravenous Once   apixaban  5 mg Oral BID   benzonatate  200 mg Oral TID   calcitRIOL  0.5 mcg Oral Q T,Th,Sa-HD   Chlorhexidine Gluconate Cloth  6 each Topical Daily   darbepoetin (ARANESP) injection - DIALYSIS  200 mcg Intravenous Q Fri-HD   diltiazem  120 mg Oral Daily   feeding supplement (NEPRO CARB STEADY)  237 mL Oral BID BM   fluticasone  2 spray Each Nare Daily   furosemide  80 mg Oral Once per day on Sun Mon Wed Fri   insulin aspart  0-5 Units Subcutaneous QHS   insulin aspart  0-9 Units Subcutaneous TID WC   insulin aspart  2 Units Subcutaneous TID WC   insulin glargine-yfgn  18 Units Subcutaneous Daily   lidocaine  1 patch Transdermal Q24H   mouth rinse  15 mL Mouth Rinse BID   metoprolol tartrate  25 mg Oral BID   multivitamin  1 tablet Oral QHS   vancomycin variable dose per unstable renal function (pharmacist dosing)   Does not apply See admin instructions   Dialysis Orders: GO TTS  4h 34min  108.5kg   400/800   2/2.5 bath  TDC   AVF RUA  -No Heparin -Mircera 225 mcg IV q 2 weeks, last 12/1 -Calcitriol 0.5 mcg PO TIW   Assessment/Plan: PNA - w/ cough, mild SOB, RLL infiltrate on CXR.  On  antibiotics.  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 Spoke with Pharmacy-plan for patient to receive Vancomycin 1g and Cefepime 2g with HD (TTS) until 12/08/21. Repeat blood cultures (-) to date.  Cardiology following  Blister R BKA-recommend ortho consult--defer to primary service  ESRD - on HD TTS. Short HD today to resume T,Th,S schedule.  Vascular Access- Poorly functioning RUE AVF (infiltrated w/ hematoma). S/p RIJ TDC placement 12/23. Plan is to give his AVF rest Myoclonic Jerking- Ongoing, seems improved today This has been ongoing for few months prior to admission. Doesn't appear to be d/t uremia. Currently not on Gabapentin. LFTs elevated. Current Ammonia level WNL. He will need outpatient neurologic workup. Volume 1+ edema bilateral thighs and LLE-probably losing body wt (hx poor appetite for 1-2 mos).Will have lower dry weight at discharge. UF as tolerated, vol status improved HTN - BP low normal and stable. Metoprolol on med list. Decreased dose to 25 mg PO BID.  Afib/ RVR - Rate controlled, on BB, Cardizem, A/C per cardiology  Echo 12/15 -- EF 45%,  Large tricuspid valve veg , severe TVR - not a good candidate for surgery per Dr. [name]  DM2 - on insulin, per primary Anemia ckd -S/p 1 unit PRBCs 12/19. Will re-dose ESA to receive with HD 12/23. On Heparin gtt. S/p 1u prbc 12/23. Transfused prn. Avoiding Fe given #1 and #2. 1u prbc 12/24, hgb up to 8.7 today MBD ckd - Corr Ca/Phos ok.  cont vdra    Gean Quint, MD [facility]

## 2021-11-06 LAB — RENAL FUNCTION PANEL
Albumin: 1.8 g/dL — ABNORMAL LOW (ref 3.5–5.0)
Anion gap: 12 (ref 5–15)
BUN: 35 mg/dL — ABNORMAL HIGH (ref 8–23)
CO2: 24 mmol/L (ref 22–32)
Calcium: 7.7 mg/dL — ABNORMAL LOW (ref 8.9–10.3)
Chloride: 94 mmol/L — ABNORMAL LOW (ref 98–111)
Creatinine, Ser: 4.11 mg/dL — ABNORMAL HIGH (ref 0.61–1.24)
GFR, Estimated: 16 mL/min — ABNORMAL LOW (ref >60)
Glucose, Bld: 192 mg/dL — ABNORMAL HIGH (ref 70–99)
Phosphorus: 5.1 mg/dL — ABNORMAL HIGH (ref 2.5–4.6)
Potassium: 4.1 mmol/L (ref 3.5–5.1)
Sodium: 130 mmol/L — ABNORMAL LOW (ref 135–145)

## 2021-11-06 LAB — GLUCOSE, CAPILLARY
Glucose-Capillary: 192 mg/dL — ABNORMAL HIGH (ref 70–99)
Glucose-Capillary: 160 mg/dL — ABNORMAL HIGH (ref 70–99)

## 2021-11-06 LAB — HEMOGLOBIN AND HEMATOCRIT, BLOOD
HCT: 30.2 % — ABNORMAL LOW (ref 39.0–52.0)
Hemoglobin: 9.1 g/dL — ABNORMAL LOW (ref 13.0–17.0)

## 2021-11-06 MED ORDER — SODIUM CHLORIDE 0.9 % IV SOLN: 2.0000 g | INTRAVENOUS | Status: DC

## 2021-11-06 MED ORDER — OXYCODONE-ACETAMINOPHEN 5-325 MG PO TABS: 1.0000 | ORAL_TABLET | Freq: Three times a day (TID) | ORAL | 0 refills | Status: DC | PRN

## 2021-11-06 MED ORDER — NOVOLIN 70/30 (70-30) 100 UNIT/ML ~~LOC~~ SUSP: 16.0000 [IU] | Freq: Two times a day (BID) | SUBCUTANEOUS | Status: DC

## 2021-11-06 MED ORDER — DILTIAZEM HCL ER COATED BEADS 120 MG PO CP24: 120.0000 mg | ORAL_CAPSULE | Freq: Every day | ORAL | 0 refills | Status: DC

## 2021-11-06 MED ORDER — SODIUM CHLORIDE 0.9 % IV SOLN: 2.0000 g | INTRAVENOUS | Status: AC

## 2021-11-06 MED ORDER — VANCOMYCIN HCL IN DEXTROSE 1-5 GM/200ML-% IV SOLN: 1000.0000 mg | INTRAVENOUS | Status: AC

## 2021-11-06 MED ORDER — CEFEPIME HCL 1 G IJ SOLR
1.0000 g | Freq: Once | INTRAMUSCULAR | Status: AC
  Administered 2021-11-06: 12:00:00 1 g via INTRAVENOUS
  Filled 2021-11-06: qty 1

## 2021-11-06 NOTE — Progress Notes (Addendum)
Pharmacy Antibiotic Note  Alexander [name] is a 60 y.o. male admitted on 10/25/2021 with SOB and found to have vegetation on TV close to PPM lead. Pharmacy has been consulted for vancomycin and cefepime dosing. Pt is ESRD on HD TTS, last session was 12/10. Received vancomycin and cefepime doses appropriately after HD session.   Last HD session 12/24 with vancomycin dose given appropriately. Will check vancomycin random pre-HD tomorrow.  Plan: Continue cefepime 1g qHS for now Continue Vancomycin 1000mg  qHD TTS Will check pre-HD vancomycin level tomorrow  ADDENDUM: pt to discharge today, will give one more dose of cefepime 1g prior to discharge and then resume 2g qHD TTS along with vancomycin.   Height: 6' (182.9 cm) Weight: 107 kg (235 lb 14.3 oz) IBW/kg (Calculated) : 77.6  Temp (24hrs), Avg:98.1 F (36.7 C), Min:97.7 F (36.5 C), Max:98.3 F (36.8 C)  Recent Labs  Lab 10/31/21 0427 11/02/21 0356 11/03/21 0122 11/04/21 0428 11/05/21 0227  WBC 13.9* 11.1* 12.5* 10.8* 11.1*  CREATININE 4.15* 4.12* 4.79* 3.37* 2.91*     Estimated Creatinine Clearance: 33.3 mL/min (A) (by C-G formula based on SCr of 2.91 mg/dL (H)).    Allergies  Allergen Reactions   Cytoxan [Cyclophosphamide] Other (See Comments)    Pancytopenia   Keflex [Cephalexin] Nausea And Vomiting   Tylenol [Acetaminophen] Other (See Comments)    Makes the patient sweat   Pletal [Cilostazol] Palpitations and Other (See Comments)    "Can hear heart beating loudly".    Antimicrobials this admission: Ceftriaxone x1 12/14  Doxycycline 12/14 >> 12/16 Vancomycin 12/16 >> Cefepime 12/16 >>  Microbiology results: 12/16 BCx: ngtd  Thank you for allowing pharmacy to be a part of this patients care.    Arrie Senate, PharmD, BCPS, [facility] Clinical Pharmacist [phone] Please check AMION for all [place] numbers 11/06/2021

## 2021-11-06 NOTE — Progress Notes (Signed)
Occupational Therapy Treatment Patient Details Name: Alexander [name] MRN: [id number] DOB: 07-28-1961 Today's Date: 11/06/2021   History of present illness 60 y.o. male presents to ED 12/14 with c/o increasing SoB and cough. Found to be in Afib with RVR, HR to 140s, hypoxic down in the 80%O2 on RA. Chest x-ray showing right basilar pneumonia with resolved left upper lobe pneumonia PMH: ESRD on HD TTS, paroxysmal atrial fibrillation on Eliquis, pacemaker, CAD s/p stent, diastolic CHF, peripheral vascular disease with right BKA, history of GI bleed, type 2 diabetes   OT comments  Patient initially refusing therapy with OT/PT stating that he can get in and out of car and home with family help. Therapy discussed with patient importance of addressing transfer prior to discharge and he agreed to with regular wheelchair and simulating car transfer.  Patient attempt to stand using momentum and assist of 2 but was unable to come to complete stand.  Slide board was used for transfer in and out of wheelchair from and to bed with mod assist +2 to wheelchair with gravity and max assist +2 back to bed due to against gravity. Patient states he felt safer with slide board transfers. Acute OT to continue to follow.    Recommendations for follow up therapy are one component of a multi-disciplinary discharge planning process, led by the attending physician.  Recommendations may be updated based on patient status, additional functional criteria and insurance authorization.    Follow Up Recommendations  Home health OT    Assistance Recommended at Discharge Frequent or constant Supervision/Assistance  Equipment Recommendations  BSC/3in1    Recommendations for Other Services      Precautions / Restrictions Precautions Precautions: Fall Precaution Comments: wound L heel, R residual limb Restrictions Weight Bearing Restrictions: No       Mobility Bed Mobility Overal bed mobility: Needs Assistance Bed Mobility:  Sit to Supine;Supine to Sit     Supine to sit: HOB elevated;Min assist Sit to supine: Min assist   General bed mobility comments: min A for pad scoot of hips to square to bed, min A for arranging pad in bed    Transfers Overall transfer level: Needs assistance   Transfers: Bed to chair/wheelchair/BSC;Sit to/from Stand Sit to Stand: Total assist          Lateral/Scoot Transfers: Mod assist;Max assist;+2 physical assistance General transfer comment: pt attempt x3 for power up to standing on L mid foot to protect L heel, unable to achieve upright, introduced slide board to pt and he was able to scoot from bed to wheelchair with mod A for downhill to wheelchair and max A with use of bed pad to transfer back uphill to bed. Pt very encouraged to be able to more freely move with assist of the slide board     Balance Overall balance assessment: Needs assistance Sitting-balance support: Feet unsupported;No upper extremity supported;Bilateral upper extremity supported;Single extremity supported Sitting balance-Leahy Scale: Fair                                     ADL either performed or assessed with clinical judgement   ADL                                              Extremity/Trunk Assessment  Vision       Perception     Praxis      Cognition Arousal/Alertness: Awake/alert Behavior During Therapy: Flat affect;Impulsive;Agitated Overall Cognitive Status: No family/caregiver present to determine baseline cognitive functioning Area of Impairment: Following commands;Safety/judgement;Awareness;Problem solving                   Current Attention Level: Selective Memory: Decreased short-term memory Following Commands: Follows multi-step commands inconsistently;Follows one step commands inconsistently Safety/Judgement: Decreased awareness of safety Awareness: Intellectual Problem Solving: Slow processing;Decreased  initiation;Difficulty sequencing;Requires verbal cues;Requires tactile cues General Comments: agitated towards therapy initially but was willing to attempt slide board transfers when stand pivot was too difficult and appeared more willing to perform slide board transfers in future          Exercises     Shoulder Instructions       General Comments prior to transfer placed gauze 4x4 to wounds and placed compression wrap to keep in place on R residual limb, also provided foam pad to R heel prior to transfer    Pertinent Vitals/ Pain       Pain Assessment: Faces Faces Pain Scale: Hurts a little bit Pain Location: back with movement Pain Descriptors / Indicators: Grimacing;Guarding;Moaning Pain Intervention(s): Limited activity within patient's tolerance;Monitored during session;Repositioned  Home Living                                          Prior Functioning/Environment              Frequency  Min 2X/week        Progress Toward Goals  OT Goals(current goals can now be found in the care plan section)  Progress towards OT goals: Progressing toward goals  Acute Rehab OT Goals Patient Stated Goal: go home OT Goal Formulation: With patient Time For Goal Achievement: 11/14/21 Potential to Achieve Goals: Good ADL Goals Pt Will Perform Lower Body Dressing: with min guard assist;sitting/lateral leans Pt Will Transfer to Toilet: with min guard assist;with transfer board Additional ADL Goal #1: Pt will complete multistep cognitive task with <3 errors. Additional ADL Goal #2: Pt will demonstrate anticipatory awareness during ADL/IADL and functional mobiltiy.  Plan Discharge plan needs to be updated    Co-evaluation    PT/OT/SLP Co-Evaluation/Treatment: Yes Reason for Co-Treatment: To address functional/ADL transfers   OT goals addressed during session: Strengthening/ROM      AM-PAC OT "6 Clicks" Daily Activity     Outcome Measure   Help from  another person eating meals?: None Help from another person taking care of personal grooming?: A Little Help from another person toileting, which includes using toliet, bedpan, or urinal?: A Lot Help from another person bathing (including washing, rinsing, drying)?: A Little Help from another person to put on and taking off regular upper body clothing?: A Little Help from another person to put on and taking off regular lower body clothing?: A Little 6 Click Score: 18    End of Session Equipment Utilized During Treatment: Gait belt;Other (comment) (slide board)  OT Visit Diagnosis: Other abnormalities of gait and mobility (R26.89);Muscle weakness (generalized) (M62.81);Pain Pain - Right/Left: Left Pain - part of body: Leg   Activity Tolerance Patient tolerated treatment well   Patient Left in bed;with call bell/phone within reach;with bed alarm set   Nurse Communication Mobility status;Other (comment) (the use of slide board for transfers)  Time: 0340-3524 OT Time Calculation (min): 59 min  Charges: OT General Charges $OT Visit: 1 Visit OT Treatments $Therapeutic Activity: 23-37 mins  Lodema Hong, [place]  Pager [phone] Office [place] 11/06/2021, 1:24 PM

## 2021-11-06 NOTE — Discharge Summary (Signed)
Physician Discharge Summary  Alexander [name] AOZ:[id number] DOB: 11/18/1960 DOA: 10/25/2021  PCP: [name], MD  Admit date: 10/25/2021 Discharge date: 11/06/2021  Admitted From: Home Disposition: Home   Recommendations for Outpatient Follow-up:  Follow up with PCP in 1-2 weeks for ongoing diabetes management. Based on inpatient trends, insulin dosing was changed down to 16u BID, which will likely need upward adjustment if oral intake improves. 70/30 insulin continued per pt preference.  Follow up with ID 1/12. Continue vancomycin and cefepime TTS through 1/27. Recommend repeat CT of the chest following antibiotic therapy.  Follow up with cardiology as scheduled 1/20 for atrial fibrillation. Diltiazem was added during this admission. Follow up with GI on 2/3 with recheck LFTs. Consideration given to liver biopsy and/or hemochromatosis testing for elevated LFTs.  Follow up with orthopedics and the [facility] for refitting of RLE prosthesis and fitting for left shoe to minimize friction/pressure on heel which has wound. Instructed pt not to use prosthesis until blisters resolve.  Home Health: Pt declined; SNF was recommended (also declined) Equipment/Devices: None new Discharge Condition: Stable CODE STATUS: Full Diet recommendation: Heart healthy, carb-modified, renal  Brief/Interim Summary: Patient is a 60 year old male with ESRD on HD, TTS, paroxysmal A. fib on Eliquis, pacemaker, CAD status post stent, diastolic CHF, PVD with right BKA, prior history of GI bleed, diabetes mellitus type 2 presented with increasing shortness of breath and cough.  Per patient he was diagnosed with pneumonia in early November and completed course of antibiotics.  However he has not felt back to baseline since.  Has had persistent shortness of breath and coughing, fatigue.  Also noted some anterior and right lateral sided rib pain worse with coughing and worsening left lower extremity edema.  He did  complete his HD last session prior to admission. In ED, patient was hypoxic down to 80%, was put on 2 L BNP 1000 181. Chest x-ray showed right basilar pneumonia, he was placed on antibiotics, subsequently found to have infection near tricuspid valve pacemaker lead. IV antibiotics have been continued with risk for pacemaker lead removal being prohibitive. With treatment he has stabilized, though due to blisters on the right BKA stump site precluding prosthesis use, SNF is recommended. This has been declined by the patient, so maximal home health services were ordered at discharge though these have been declined as well. He remains stable for discharge with full details below.   Discharge Diagnoses:  Principal Problem:   Tricuspid valve vegetation Active Problems:   Diabetes mellitus with end stage renal disease (HCC)   PVD (peripheral vascular disease) (HCC)   CAD (coronary artery disease), native coronary artery   Pacemaker: Medtronic Advisa DR MRI [id number] Dual chamber pacemaker 10/08/2014   ESRD (end stage renal disease) ([place])   Atrial fibrillation with RVR (HCC)   Thrombocytopenia (HCC)   Hyponatremia   Acute on chronic diastolic CHF (congestive heart failure) (HCC)   Transaminitis   Pacemaker infection ([place])   Malnutrition of moderate degree  Acute hypoxic respiratory failure: Due to acute on chronic HFpEF, RLL CAP, PAF with RVR: Hypoxia resolved. - Continue antitussives/mucolytics, incentive spirometry, flutter valve, OOB - Continue abx as below which will treat CAP. Per CT chest 12/16 there is parapneumonic effusion, empyema not excluded, though effusion appears small and pt will be treated with protracted abx course. Recommend repeat chest CT after completion of treatment.   ESRD:  - Continue HD per nephrology thru [facility] placed 12/23. HD to continue on usual TTS schedule.  EDW lowered this admission.   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 No discitis or abscess on CT.  - Risk of worsening infection/embolization with pacemaker removal/reimplantation in setting of active infection is prohibitive, so plan is to treat with IV abx per ID (vancomycin and cefepime after HD thru 12/08/2021) with subsequent transition to oral agent(s). Has [facility] follow up 1/12 with Dr. Gale Journey.    Acute blood loss anemia on anemia of CKD: Required 1u PRBCs 12/19, 12/23. No active bleeding, but stigmata of considerable prior bleeding/ecchymoses noted. Has required intermittent transfusions of RBCs and iron in the past.  - Continue eliquis with no bleeding - ESA per nephrology.  - Transferrin noted to be undetectably low, Ferritin is 2,418 acting as acute phase reactant.   Permanent atrial fibrillation, sinus node dysfunction s/p PPM: Rate is controlled/paced.  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 Stable relatively. Ammonia normal, hepatitis panel negative. Motion-degraded MRCP showed no biliary ductal dilatation or choledocholithiasis. There was mention of hemosiderosis involving the liver and spleen. - Consider liver biopsy after pt is more clinically stable/infection treatment completed. HFE gene mutation testing could also be pursued.  - Monitor CMP intermittently.   IDT2DM: HbA1c 7.8%.  - DC'ed 70/30 insulin due to hypoglycemia, may not be best option for pt going forward, though it is continued at discharge due to financial constaints. Recommend PCP follow up. Dose is reduced at discharge based on inpatient requirements.     Moderate protein calorie malnutrition: Hypoalbuminemia - Supplement protein as able.    PAD: s/p RLE bypass prior to BKA, left fem-TP trunk bypass, 2-stage basilic vein AVF.  - Vascular surgery involved in care as above. No critical limb  ischemia currently. - Continue anticoagulation, restart statin since LFTs stable.   Right stump site blisters: Stable, no evidence of infection.  - Advised not to don prosthesis until this heals. Follow up with [facility].   High fall risk with inability to use prosthesis.  - Pt declines SNF and home health. He has reached maximal benefit of hospitalization and is medically stable, has capacity to make medical decisions. Will be discharged with return precautions. He is at high risk of readmission.    Unstageable left heel wound due to shear force and pressure: hx left transmetatarsal amputation as well. High risk for poor wound healing.  - Offload, prevalon boot refused by pt.  - Will need proper shoe fitting at [facility] after discharge.    Obesity: Estimated body mass index is 31.99 kg/m   Discharge Instructions Discharge Instructions     Call MD for:  difficulty breathing, headache or visual disturbances   Complete by: As directed    Call MD for:  extreme fatigue   Complete by: As directed    Call MD for:  persistant dizziness or light-headedness   Complete by: As directed    Call MD for:  persistant nausea and vomiting   Complete by: As directed    Call MD for:  redness, tenderness, or signs of infection (pain, swelling, redness, odor or green/yellow discharge around incision site)   Complete by: As directed    Call MD for:  severe uncontrolled pain   Complete by: As directed    Call MD for:  temperature >100.4   Complete by: As  directed    Diet - low sodium heart healthy   Complete by: As directed    Diet Carb Modified   Complete by: As directed    Discharge instructions   Complete by: As directed    You were treated for an infection near the pacemaker lead in the heart which is too dangerous to remove at this time. You will continue antibiotics with dialysis through the end of January. You have a follow up appointment with the infectious disease clinic  scheduled.   To help regulate heart rate, continue metoprolol as you were, and START diltiazem as well. Continue taking eliquis as you were to reduce risk of stroke.   Based on your blood sugars and insulin requirements here, you should take 70/30 novolin insulin 16 units twice a day. This will need to be modified based on your blood sugars after discharge. Please follow up with Dr. [name] in the next 1-2 weeks, take a record of your blood sugars to that appointment. If your blood sugars are consistently < 10 or consistently >300, call the office to seek medical advice.   You will need recheck of your liver function tests and, if these remain abnormal, you would benefit from GI evaluation as an outpatient to consider liver biopsy.   You may take percocet (oxycodone and tylenol combination) as needed for severe pain, otherwise take tylenol for less severe pain. Do not exceed a total of 4,000 mg of tylenol (from all sources) in a 24 hour period.  Follow up with vascular surgery, orthopedic surgery, and [facility] as we've discussed. Do not don the prosthesis until the blisters are healed.   Discharge wound care:   Complete by: As directed    Keep pressure off the left heel and the right BKA site.   Increase activity slowly   Complete by: As directed       Allergies as of 11/06/2021       Reactions   Cytoxan [cyclophosphamide] Other (See Comments)   Pancytopenia   Keflex [cephalexin] Nausea And Vomiting   Tylenol [acetaminophen] Other (See Comments)   Makes the patient sweat   Pletal [cilostazol] Palpitations, Other (See Comments)   "Can hear heart beating loudly".        Medication List     STOP taking these medications    methocarbamol 500 MG tablet Commonly known as: ROBAXIN       TAKE these medications    Accu-Chek Advantage Diabetes kit Use as instructed   Accu-Chek FastClix Lancets Misc Check blood sugar TID & QHS   apixaban 5 MG Tabs tablet Commonly known  as: Eliquis Take 1 tablet (5 mg total) by mouth 2 (two) times daily. What changed: when to take this   atorvastatin 20 MG tablet Commonly known as: LIPITOR Take 1 tablet (20 mg total) by mouth at bedtime.   blood glucose meter kit and supplies Kit Check blood sugar TID & QHS   ceFEPIme 2 g in sodium chloride 0.9 % 100 mL Inject 2 g into the vein Every Tuesday,Thursday,and Saturday with dialysis. Start taking on: November 07, 2021   diltiazem 120 MG 24 hr capsule Commonly known as: CARDIZEM CD Take 1 capsule (120 mg total) by mouth daily. Start taking on: November 07, 2021   fluticasone 50 MCG/ACT nasal spray Commonly known as: FLONASE Place 2 sprays into both nostrils daily.   furosemide 80 MG tablet Commonly known as: LASIX Take 80 mg by mouth See admin instructions. Take 80  mg by mouth on (non-dialysis days) Mondays, Wednesdays, Fridays & Sundays in the morning as needed "to cause urination"   glucose blood test strip Use as instructed   glucose blood test strip Commonly known as: Accu-Chek Aviva Plus Use as instructed   glucose blood test strip Commonly known as: Choice DM Fora G20 Test Strips Check blood sugar TID & QHS   lidocaine-prilocaine cream Commonly known as: EMLA Apply 1 application topically every dialysis (on Tues/Thurs/Sat).   metoprolol tartrate 25 MG tablet Commonly known as: LOPRESSOR Take 1 tablet (25 mg total) by mouth 2 (two) times daily.   multivitamin with minerals Tabs tablet Take 1 tablet by mouth 2 (two) times a week.   NovoLIN 70/30 (70-30) 100 UNIT/ML injection Generic drug: insulin NPH-regular Human Inject 16 Units into the skin 2 (two) times daily with a meal. What changed:  how much to take when to take this additional instructions Another medication with the same name was removed. Continue taking this medication, and follow the directions you see here.   oxyCODONE-acetaminophen 5-325 MG tablet Commonly known as:  PERCOCET/ROXICET Take 1 tablet by mouth every 8 (eight) hours as needed for moderate pain or severe pain.   vancomycin 1-5 GM/200ML-% Soln Commonly known as: VANCOCIN Inject 200 mLs (1,000 mg total) into the vein Every Tuesday,Thursday,and Saturday with dialysis. Start taking on: November 07, 2021               Discharge Care Instructions  (From admission, onward)           Start     Ordered   11/06/21 0000  Discharge wound care:       Comments: Keep pressure off the left heel and the right BKA site.   11/06/21 1050            Follow-up Information     Jabier Mutton, MD. Go on 11/23/2021.   Specialty: Infectious Diseases Why: 9:30am Contact information: [address] [place] [address] [phone]         Alethia Berthold, [place]. Go on 12/01/2021.   Specialty: Cardiology Why: 9:00am Contact information: [place] [address] [phone]         Jerene Bears, MD. Go on 12/15/2021.   Specialty: Gastroenterology Why: 10:10am Contact information: [address] [place] [address] [phone]         Clinic, [place]. Schedule an appointment as soon as possible for a visit.   Contact information: [place] [address] [phone]         Angelia Mould, MD. Schedule an appointment as soon as possible for a visit.   Specialties: Vascular Surgery, Cardiology Contact information: [address] [place] [address] [phone]                Allergies  Allergen Reactions   Cytoxan [Cyclophosphamide] Other (See Comments)    Pancytopenia   Keflex [Cephalexin] Nausea And Vomiting   Tylenol [Acetaminophen] Other (See Comments)    Makes the patient sweat   Pletal [Cilostazol] Palpitations and Other (See Comments)    "Can hear heart beating loudly".    Consultations: Nephrology ID Vascular surgery Cardiology  Procedures/Studies: DG Chest 2  View  Result Date: 10/25/2021 CLINICAL DATA:  Shortness of breath EXAM: CHEST - 2 VIEW COMPARISON:  09/30/2021 FINDINGS: Left-sided pacing device as before. Cardiomegaly. Suspicion of moderate right pleural effusion. Hazy airspace disease and consolidation  at the right base. Aortic atherosclerosis. No pneumothorax. IMPRESSION: 1. Clearing of previously noted left upper lobe pneumonia 2. New hazy airspace disease and consolidation at right base suspicious for right basilar pneumonia. Suspect that there is moderate right pleural effusion 3. Cardiomegaly Electronically Signed   By: Donavan Foil M.D.   On: 10/25/2021 19:28   CT CHEST WO CONTRAST  Result Date: 10/27/2021 CLINICAL DATA:  Pneumonia EXAM: CT CHEST WITHOUT CONTRAST TECHNIQUE: Multidetector CT imaging of the chest was performed following the standard protocol without IV contrast. COMPARISON:  Previous studies including the radiographs done on 10/25/2021 and CT done on 09/16/2021 FINDINGS: Cardiovascular: Coronary artery calcifications are seen. Heart is enlarged in size. Biventricular pacer leads are noted in place. Mediastinum/Nodes: No significant lymphadenopathy seen. Lungs/Pleura: There is significant interval decrease in patchy infiltrate in the left upper lobe. Small nodular infiltrate seen in the right upper lung fields in the previous study could not be distinctly identified. There is interval appearance of large patchy alveolar infiltrates in the right lower lobe and to a lesser extent in the right middle lobe. There is small to moderate right pleural effusion. Part of the effusion appears to be loculated in the interlobar fissure. There is no pneumothorax. Upper Abdomen: Stomach is distended with fluid. Musculoskeletal: Unremarkable. IMPRESSION: There is a almost complete interval clearing of small infiltrate in the left upper lobe and clearing of small infiltrate in the right upper lobe. There is interval appearance of large patchy  alveolar infiltrates in the right lower lobe and to a lesser extent in the right middle lobe. Findings suggest large area of pneumonia. There is interval appearance of small to moderate right pleural effusion part of which appears to be loculated in the periphery of right lower lung fields and within major fissure. Possibility of empyema is not excluded. Other findings as described in the body of the report. Electronically Signed   By: Elmer Picker M.D.   On: 10/27/2021 19:19   CT LUMBAR SPINE WO CONTRAST  Result Date: 10/28/2021 CLINICAL DATA:  Lumbar radiculopathy. Back pain with persistent symptoms for over 6 weeks of treatment. EXAM: CT LUMBAR SPINE WITHOUT CONTRAST TECHNIQUE: Multidetector CT imaging of the lumbar spine was performed without intravenous contrast administration. Multiplanar CT image reconstructions were also generated. COMPARISON:  09/16/2021 FINDINGS: Segmentation: 5 lumbar type vertebrae Alignment: Physiologic Vertebrae: Interval endplate fractures at L2 inferiorly, L3 superiorly, and L5 inferiorly. These have a nonacute appearance but may be incompletely healed. No overt endplate destruction, lucency in the L2 and L3 bodies is adjacent to the fracture deformities. Paraspinal and other soft tissues: Severe atheromatous plaque of the aorta and branch vessels. Small right pleural effusion. Body wall edema. Disc levels: T12- L1: Unremarkable. L1-L2: Disc narrowing with spondylitic and facet spurring. No noted impingement L2-L3: Disc narrowing and bulging. Facet spurring and ligamentum flavum thickening with severe spinal stenosis. Moderate bilateral foraminal narrowing asymmetric to the right L3-L4: Disc narrowing and bulging with endplate spurring. Laminectomy with patent spinal canal L4-L5: Disc narrowing and bulging with endplate spurring. Laminectomy. L5-S1:Disc collapse with endplate and facet spurring. Moderate foraminal narrowing on both sides. IMPRESSION: 1. Subacute appearing  endplate fractures at L2, L3, and L5. These have occurred since November 2022. 2. Lumbar spine degeneration exacerbated by short pedicles. Advanced spinal stenosis at L2-3. Electronically Signed   By: Jorje Guild M.D.   On: 10/28/2021 11:45   MR ABDOMEN MRCP WO CONTRAST  Result Date: 11/01/2021 CLINICAL DATA:  Right upper quadrant pain  EXAM: MRI ABDOMEN WITHOUT CONTRAST  (INCLUDING MRCP) TECHNIQUE: Multiplanar multisequence MR imaging of the abdomen was performed. Heavily T2-weighted images of the biliary and pancreatic ducts were obtained, and three-dimensional MRCP images were rendered by post processing. COMPARISON:  CT abdomen and pelvis 09/16/2021, abdominal ultrasound 10/29/2021 FINDINGS: Study is limited due to motion. Lower chest: Small right pleural effusion. Extensive irregular signal abnormalities in the visualized right lower lobe. Hepatobiliary: Liver is normal in size in contour with no suspicious mass identified. Hepatic parenchyma demonstrates diffuse T2 hypointensity as well as relatively increased signal intensity on out of phase T1, consistent with hemosiderosis. Gallbladder is moderately distended with no significant wall thickening or pericholecystic edema visualized. No cholelithiasis identified. No biliary ductal dilatation or definite choledocholithiasis, given limitations of motion. Common bile duct measures up to 6 mm in diameter. Pancreas: Appears grossly normal with no definite mass or ductal dilatation. Limited visualization. Spleen: Upper normal size. Diffuse hypointense T2 signal also consistent with hemosiderosis. Adrenals/Urinary Tract: Adrenal glands appear normal. A few tiny hyperintense T2 signal likely cysts in the right kidney. No hydronephrosis visualized. Stomach/Bowel: Visualized portions within the abdomen are unremarkable. Vascular/Lymphatic: No pathologically enlarged lymph nodes identified. No abdominal aortic aneurysm demonstrated. Other:  No ascites identified.  Musculoskeletal: No suspicious bony lesions identified. IMPRESSION: 1. No biliary duct dilatation or definite choledocholithiasis identified. Limited study due to motion. 2. Extensive irregular signal abnormalities in the right lower lobe suggesting pneumonia and/or atelectasis. Small right pleural effusion. 3. 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 Electronically Signed   By: Ofilia Neas M.D.   On: 11/01/2021 15:05   DG Chest Port 1 View  Result Date: 11/03/2021 CLINICAL DATA:  Right IJ catheter placement EXAM: PORTABLE CHEST 1 VIEW COMPARISON:  10/25/2021 FINDINGS: Right jugular dual lumen central venous catheter with the tip projecting over the upper SVC. Right lower lobe airspace disease which has increased compared with the prior examination and may involve the right upper lobe. Small right pleural effusion. No pneumothorax. Stable cardiomegaly. Dual lead cardiac pacemaker. No acute osseous abnormality. IMPRESSION: 1. Right jugular dual lumen central venous catheter with the tip projecting over the upper SVC. 2. Persistent right lower lobe airspace disease concerning for pneumonia with possible involvement of the right upper lobe. Electronically Signed   By: Kathreen Devoid M.D.   On: 11/03/2021 09:17   ECHOCARDIOGRAM COMPLETE  Result Date: 10/27/2021    ECHOCARDIOGRAM REPORT   Patient Name:   Alexander [name] [facility] Date of Exam: 10/26/2021 Medical Rec #:  [id number]     Height:       72.0 in Accession #:    [id number]    Weight:       247.5 lb Date of Birth:  Sep 25, 1961     BSA:          2.333 m Patient Age:    86 years      BP:           115/52 mmHg Patient Gender: M             HR:           117 bpm. Exam Location:  Inpatient Procedure: 2D Echo, Cardiac Doppler, Color Doppler and Intracardiac            Opacification Agent Indications:     CHF  History:         Patient has prior history of Echocardiogram examinations, most  recent 05/24/2020. Pacemaker, Arrythmias:Atrial  Fibrillation and                  Tachycardia, Signs/Symptoms:Shortness of Breath; Risk                  Factors:Hypertension and Diabetes. Pneumonia.  Sonographer:     Merrie Roof RDCS Referring Phys:  [id number] [facility] Diagnosing Phys: Adrian Prows MD IMPRESSIONS  1. Left ventricular ejection fraction, by estimation, is 40 to 45%. The left ventricle has mildly decreased function. The left ventricle demonstrates global hypokinesis. There is severe concentric left ventricular hypertrophy. Left ventricular diastolic  parameters are indeterminate.  2. Right ventricular systolic function is normal. The right ventricular size is normal. There is mildly elevated pulmonary artery systolic pressure. The estimated right ventricular systolic pressure is 86.7 mmHg.  3. Left atrial size was moderately dilated.  4. Right atrial size was mildly dilated.  5. The mitral valve is normal in structure. Moderate mitral valve regurgitation.  6. Vegetation or a thrombus measuring 1.2x1.2 cm seen on the RV lead of the pacemaker. Leaflet involvement cannot be excluded. . The tricuspid valve is degenerative. Tricuspid valve regurgitation is moderate to severe.  7. The aortic valve is normal in structure. Aortic valve regurgitation is not visualized. No aortic stenosis is present.  8. The inferior vena cava is dilated in size with <50% respiratory variability, suggesting right atrial pressure of 15 mmHg. FINDINGS  Left Ventricle: Left ventricular ejection fraction, by estimation, is 40 to 45%. The left ventricle has mildly decreased function. The left ventricle demonstrates global hypokinesis. The left ventricular internal cavity size was normal in size. There is  severe concentric left ventricular hypertrophy. Abnormal (paradoxical) septal motion, consistent with RV pacemaker. Left ventricular diastolic parameters are indeterminate. Right Ventricle: The right ventricular size is normal. No increase in right ventricular wall thickness. Right  ventricular systolic function is normal. There is mildly elevated pulmonary artery systolic pressure. The tricuspid regurgitant velocity is 2.42  m/s, and with an assumed right atrial pressure of 15 mmHg, the estimated right ventricular systolic pressure is 67.2 mmHg. Left Atrium: Left atrial size was moderately dilated. Right Atrium: Right atrial size was mildly dilated. Pericardium: There is no evidence of pericardial effusion. Mitral Valve: The mitral valve is normal in structure. There is moderate thickening of the mitral valve leaflet(s). Moderate mitral valve regurgitation. Tricuspid Valve: Vegetation or a thrombus measuring 1.2x1.2 cm seen on the RV lead of the pacemaker. Leaflet involvement cannot be excluded. The tricuspid valve is degenerative in appearance. Tricuspid valve regurgitation is moderate to severe. Aortic Valve: The aortic valve is normal in structure. Aortic valve regurgitation is not visualized. No aortic stenosis is present. Aortic valve mean gradient measures 3.0 mmHg. Aortic valve peak gradient measures 6.1 mmHg. Aortic valve area, by VTI measures 2.03 cm. Pulmonic Valve: The pulmonic valve was normal in structure. Pulmonic valve regurgitation is not visualized. No evidence of pulmonic stenosis. Aorta: The aortic root is normal in size and structure. Venous: The inferior vena cava is dilated in size with less than 50% respiratory variability, suggesting right atrial pressure of 15 mmHg. IAS/Shunts: No atrial level shunt detected by color flow Doppler.  LEFT VENTRICLE PLAX 2D LVIDd:         3.80 cm LVIDs:         3.00 cm LV PW:         1.70 cm LV IVS:        1.70 cm LVOT  diam:     2.00 cm LV SV:         35 LV SV Index:   15 LVOT Area:     3.14 cm  LV Volumes (MOD) LV vol d, MOD A2C: 145.0 ml LV vol d, MOD A4C: 131.0 ml LV vol s, MOD A2C: 56.7 ml LV vol s, MOD A4C: 109.0 ml LV SV MOD A2C:     88.3 ml LV SV MOD A4C:     131.0 ml LV SV MOD BP:      62.4 ml RIGHT VENTRICLE             IVC RV  Basal diam:  4.70 cm     IVC diam: 2.30 cm RV Mid diam:    4.60 cm RV S prime:     14.30 cm/s TAPSE (M-mode): 1.8 cm LEFT ATRIUM              Index        RIGHT ATRIUM           Index LA diam:        3.50 cm  1.50 cm/m   RA Area:     41.40 cm LA Vol (A2C):   100.0 ml 42.87 ml/m  RA Volume:   174.00 ml 74.60 ml/m LA Vol (A4C):   100.0 ml 42.87 ml/m LA Biplane Vol: 107.0 ml 45.87 ml/m  AORTIC VALVE AV Area (Vmax):    2.23 cm AV Area (Vmean):   1.92 cm AV Area (VTI):     2.03 cm AV Vmax:           123.00 cm/s AV Vmean:          87.200 cm/s AV VTI:            0.174 m AV Peak Grad:      6.1 mmHg AV Mean Grad:      3.0 mmHg LVOT Vmax:         87.50 cm/s LVOT Vmean:        53.300 cm/s LVOT VTI:          0.112 m LVOT/AV VTI ratio: 0.65  AORTA Ao Root diam: 3.60 cm Ao Asc diam:  3.00 cm TRICUSPID VALVE TR Peak grad:   23.4 mmHg TR Vmax:        242.00 cm/s  SHUNTS Systemic VTI:  0.11 m Systemic Diam: 2.00 cm Adrian Prows MD Electronically signed by Adrian Prows MD Signature Date/Time: 10/27/2021/5:33:46 PM    Final    ECHO TEE  Result Date: 10/27/2021    TRANSESOPHOGEAL ECHO REPORT   Patient Name:   Alexander [name] [facility] Date of Exam: 10/27/2021 Medical Rec #:  [id number]     Height:       72.0 in Accession #:    [id number]    Weight:       236.8 lb Date of Birth:  [birth year]     BSA:          2.289 m Patient Age:    46 years      BP:           119/68 mmHg Patient Gender: M             HR:           82 bpm. Exam Location:  Inpatient Procedure: Transesophageal Echo, Cardiac Doppler and Color Doppler Indications:     endocarditis  History:         Patient has prior history  of Echocardiogram examinations, most                  recent 10/26/2021. CHF, CAD, Pacemaker, Arrythmias:Atrial                  Fibrillation; Risk Factors:Diabetes.  Sonographer:     Johny Chess RDCS Referring Phys:  Viola Diagnosing Phys: Adrian Prows MD PROCEDURE: After discussion of the risks and benefits of a TEE, an informed consent was  obtained from the patient. The transesophogeal probe was passed without difficulty through the esophogus of the patient. Imaged were obtained with the patient in a left lateral decubitus position. Local oropharyngeal anesthetic was provided with Cetacaine. Sedation performed by performing physician. The patient was monitored while under deep sedation. Patients was under conscious sedation during this procedure. Anesthetic administered: 65mg of Fentanyl, 2.088mof Versed. The patient developed no complications during the procedure. IMPRESSIONS  1. Left ventricular ejection fraction, by estimation, is 40 to 45%. The left ventricle has mildly decreased function. The left ventricle demonstrates global hypokinesis. There is moderate concentric left ventricular hypertrophy.  2. Right ventricular systolic function is normal. The right ventricular size is normal.  3. Left atrial size was moderately dilated. No left atrial/left atrial appendage thrombus was detected. The LAA emptying velocity was 30 cm/s.  4. Right atrial size was mildly dilated. There is a 1x1cm mass attached to the RA pacemaker lead suggestive of thrombus or a vegetation.  5. The mitral valve is normal in structure. Mild to moderate mitral valve regurgitation. No evidence of mitral stenosis.  6. There is a 2cmx1cm mass attached to the RV lead of the pacemaker suggestive of a vegetation or a thrombus. The Anterior and septal TV leaflets are thickened and suggests involvement with the vegetation. The septal leaflet is flail. The presence of flail leaflet suggests vegetation. . The tricuspid valve is degenerative. Tricuspid valve regurgitation is severe.  7. The aortic valve is normal in structure. Aortic valve regurgitation is not visualized. No aortic stenosis is present.  8. There is mild (Grade II) plaque involving the ascending aorta, aortic arch and descending aorta. Comparison(s): A prior study was performed on 05/24/2120. Previously TTE had revealed  TV was normal and mild TR. LVEF was normal. FINDINGS  Left Ventricle: Left ventricular ejection fraction, by estimation, is 40 to 45%. The left ventricle has mildly decreased function. The left ventricle demonstrates global hypokinesis. The left ventricular internal cavity size was normal in size. There is  moderate concentric left ventricular hypertrophy. Abnormal (paradoxical) septal motion, consistent with RV pacemaker. Right Ventricle: The right ventricular size is normal. No increase in right ventricular wall thickness. Right ventricular systolic function is normal. Left Atrium: Left atrial size was moderately dilated. No left atrial/left atrial appendage thrombus was detected. The LAA emptying velocity was 30 cm/s. Right Atrium: Right atrial size was mildly dilated. Prominent Eustachian valve and There is a 1x1cm mass attached to the RA pacemaker lead suggestive of thrombus or a vegetation. Pericardium: Trivial pericardial effusion is present. Mitral Valve: The mitral valve is normal in structure. Mild to moderate mitral valve regurgitation. No evidence of mitral valve stenosis. Tricuspid Valve: There is a 2cmx1cm mass attached to the RV lead of the pacemaker suggestive of a vegetation or a thrombus. The Anterior and septal TV leaflets are thickened and suggests involvement with the vegetation. The septal leaflet is flail. The presence of flail leaflet suggests vegetation. The tricuspid valve is degenerative in appearance. Tricuspid valve regurgitation  is severe. Aortic Valve: The aortic valve is normal in structure. Aortic valve regurgitation is not visualized. No aortic stenosis is present. Pulmonic Valve: The pulmonic valve was normal in structure. Pulmonic valve regurgitation is trivial. Aorta: The aortic root is normal in size and structure. There is mild (Grade II) plaque involving the ascending aorta, aortic arch and descending aorta. IAS/Shunts: No atrial level shunt detected by color flow Doppler. Adrian Prows MD Electronically signed by Adrian Prows MD Signature Date/Time: 10/27/2021/5:28:28 PM    Final    HYBRID OR IMAGING ([name])  Result Date: 11/03/2021 There is no interpretation for this exam.  This order is for images obtained during a surgical procedure.  Please See "Surgeries" Tab for more information regarding the procedure.   US Abdomen Limited RUQ (LIVER/GB)  Result Date: 10/29/2021 CLINICAL DATA:  Transaminitis EXAM: ULTRASOUND ABDOMEN LIMITED RIGHT UPPER QUADRANT COMPARISON:  CT abdomen pelvis 09/17/2019 FINDINGS: Gallbladder: No gallstones or wall thickening visualized. Moderate amount of sludge. No sonographic Murphy sign noted by sonographer. Common bile duct: Diameter: 1.1 cm, dilated. Liver: No focal lesion identified. Within normal limits in parenchymal echogenicity. Portal vein is patent on color Doppler imaging with normal direction of blood flow towards the liver. Other: None. IMPRESSION: 1. Nonspecific dilation of the common bile duct measuring 1.1 cm. If there is concern for biliary duct obstruction consider MRI/MRCP. 2.  Moderate amount of sludge in the gallbladder. 3.  Normal sonographic appearance of the liver. Electronically Signed   By: Audie Pinto M.D.   On: 10/29/2021 12:35    Subjective: Feels well, wants to go home. Insists he'll do well there. No new complaints.   Discharge Exam: Vitals:   11/06/21 0828 11/06/21 1043  BP: 111/77 137/76  Pulse: 90 93  Resp:  17  Temp:  98.6 F (37 C)  SpO2:     General: Pt is alert, awake, not in acute distress Cardiovascular: RRR, S1/S2 +, no rubs, no gallops Respiratory: CTA bilaterally, no wheezing, no rhonchi Abdominal: Soft, NT, ND, bowel sounds + Extremities: No pitting edema, no cyanosis. Left TMA c/d/I, right BKA stump with stable blister without purulence or erythema or tenderness.   Labs: BNP (last 3 results) Recent Labs    10/25/21 1830  BNP 1,791.5*   Basic Metabolic Panel: Recent Labs  Lab  11/02/21 0356 11/03/21 0122 11/04/21 0428 11/05/21 0227 11/06/21 0754  NA 129* 130* 132* 133* 130*  K 3.6 3.6 4.0 3.6 4.1  CL 94* 96* 97* 98 94*  CO2 '23 26 26 25 24  ' GLUCOSE 241* 186* 240* 218* 192*  BUN 36* 45* 28* 22 35*  CREATININE 4.12* 4.79* 3.37* 2.91* 4.11*  CALCIUM 7.5* 7.8* 7.5* 8.0* 7.7*  PHOS 4.7* 5.2* 4.2 3.6 5.1*   Liver Function Tests: Recent Labs  Lab 10/30/21 1552 10/31/21 0427 11/02/21 0356 11/03/21 0122 11/04/21 0428 11/05/21 0227 11/06/21 0754  AST 71* 42*  --   --   --   --   --   ALT 115* 87*  --   --   --   --   --   ALKPHOS 74 65  --   --   --   --   --   BILITOT 1.9* 1.9*  --   --   --   --   --   PROT 5.4* 5.3*  --   --   --   --   --   ALBUMIN 1.7* 1.6* 1.6* 1.6* 1.7* 2.0* 1.8*  No results for input(s): LIPASE, AMYLASE in the last 168 hours. Recent Labs  Lab 10/30/21 1422  AMMONIA 20   CBC: Recent Labs  Lab 10/31/21 0427 11/02/21 0356 11/03/21 0122 11/03/21 1049 11/04/21 0428 11/05/21 0227 11/06/21 0754  WBC 13.9* 11.1* 12.5*  --  10.8* 11.1*  --   HGB 7.8* 7.1* 6.6* 8.0* 7.5* 8.7* 9.1*  HCT 25.0* 23.5* 22.3* 27.0* 24.8* 28.9* 30.2*  MCV 88.0 90.7 91.4  --  92.5 92.6  --   PLT 107* 89* 89*  --  90* 93*  --    Cardiac Enzymes: No results for input(s): CKTOTAL, CKMB, CKMBINDEX, TROPONINI in the last 168 hours. BNP: Invalid input(s): POCBNP CBG: Recent Labs  Lab 11/05/21 0716 11/05/21 1130 11/05/21 1610 11/05/21 2236 11/06/21 0734  GLUCAP 226* 238* 119* 149* 192*   D-Dimer No results for input(s): DDIMER in the last 72 hours. Hgb A1c No results for input(s): HGBA1C in the last 72 hours. Lipid Profile No results for input(s): CHOL, HDL, LDLCALC, TRIG, CHOLHDL, LDLDIRECT in the last 72 hours. Thyroid function studies No results for input(s): TSH, T4TOTAL, T3FREE, THYROIDAB in the last 72 hours.  Invalid input(s): FREET3 Anemia work up No results for input(s): VITAMINB12, FOLATE, FERRITIN, TIBC, IRON, RETICCTPCT in  the last 72 hours. Urinalysis    Component Value Date/Time   COLORURINE YELLOW 05/23/2020 2057   APPEARANCEUR CLEAR 05/23/2020 2057   LABSPEC 1.008 05/23/2020 2057   PHURINE 5.0 05/23/2020 2057   GLUCOSEU 50 (A) 05/23/2020 2057   HGBUR LARGE (A) 05/23/2020 2057   BILIRUBINUR NEGATIVE 05/23/2020 2057   BILIRUBINUR neg 10/01/2014 1445   KETONESUR NEGATIVE 05/23/2020 2057   PROTEINUR 100 (A) 05/23/2020 2057   UROBILINOGEN 0.2 10/01/2014 1445   UROBILINOGEN 0.2 09/30/2014 2051   NITRITE NEGATIVE 05/23/2020 2057   LEUKOCYTESUR NEGATIVE 05/23/2020 2057    Microbiology Recent Results (from the past 240 hour(s))  Surgical pcr screen     Status: None   Collection Time: 11/03/21  7:00 AM   Specimen: Nasal Mucosa; Nasal Swab  Result Value Ref Range Status   MRSA, PCR NEGATIVE NEGATIVE Final   Staphylococcus aureus NEGATIVE NEGATIVE Final    Comment: (NOTE) The Xpert SA Assay (FDA approved for NASAL specimens in patients 8 years of age and older), is one component of a comprehensive surveillance program. It is not intended to diagnose infection nor to guide or monitor treatment. Performed at [facility], [place] [address], [place], [place] [address]     Time coordinating discharge: Approximately 40 minutes  Patrecia Pour, MD  [facility] 11/06/2021, 10:55 AM

## 2021-11-06 NOTE — Progress Notes (Signed)
[facility] Progress Note   Subjective:    Patient seen and examined at bedside. No complaints. Denies SOB, CP, and N/V. No myoclonic jerking noted. Patient is wanting to go home.  Objective Vitals:   11/05/21 2037 11/06/21 0408 11/06/21 0828 11/06/21 1043  BP:  120/68 111/77 137/76  Pulse: 94 78 90 93  Resp:  18  17  Temp:  97.7 F (36.5 C)  98.6 F (37 C)  TempSrc:  Oral  Oral  SpO2:  99%    Weight:      Height:       Physical Exam General: Well-appearing; NAD Heart: Irregular; S1 and S2; No murmurs, gallops, or rubs. Lungs: Clear throughout. No wheezing, rales, or rhonchi Abdomen: Large, soft, and non-tender Extremities: Trace edema BL thighs; No edema LLE; R BKA; No myoclonic jerking noted Dialysis Access: R AVF bruised (+) Bruit/Thrill; RIJ [facility] Dressing CDI   Filed Weights   11/04/21 0522 11/04/21 1447 11/04/21 1740  Weight: 109.5 kg 108.2 kg 107 kg   No intake or output data in the 24 hours ending 11/06/21 1601  Additional Objective Labs: Basic Metabolic Panel: Recent Labs  Lab 11/04/21 0428 11/05/21 0227 11/06/21 0754  NA 132* 133* 130*  K 4.0 3.6 4.1  CL 97* 98 94*  CO2 26 25 24   GLUCOSE 240* 218* 192*  BUN 28* 22 35*  CREATININE 3.37* 2.91* 4.11*  CALCIUM 7.5* 8.0* 7.7*  PHOS 4.2 3.6 5.1*   Liver Function Tests: Recent Labs  Lab 10/31/21 0427 11/02/21 0356 11/04/21 0428 11/05/21 0227 11/06/21 0754  AST 42*  --   --   --   --   ALT 87*  --   --   --   --   ALKPHOS 65  --   --   --   --   BILITOT 1.9*  --   --   --   --   PROT 5.3*  --   --   --   --   ALBUMIN 1.6*   < > 1.7* 2.0* 1.8*   < > = values in this interval not displayed.   No results for input(s): LIPASE, AMYLASE in the last 168 hours. CBC: Recent Labs  Lab 10/31/21 0427 11/02/21 0356 11/03/21 0122 11/03/21 1049 11/04/21 0428 11/05/21 0227 11/06/21 0754  WBC 13.9* 11.1* 12.5*  --  10.8* 11.1*  --   HGB 7.8* 7.1* 6.6*   < > 7.5* 8.7* 9.1*  HCT 25.0*  23.5* 22.3*   < > 24.8* 28.9* 30.2*  MCV 88.0 90.7 91.4  --  92.5 92.6  --   PLT 107* 89* 89*  --  90* 93*  --    < > = values in this interval not displayed.   Blood Culture    Component Value Date/Time   SDES BLOOD LEFT ARM 10/27/2021 1036   SPECREQUEST  10/27/2021 1036    BOTTLES DRAWN AEROBIC AND ANAEROBIC Blood Culture adequate volume   CULT  10/27/2021 1036    NO GROWTH 5 DAYS Performed at [facility], [place] [address] [place], [place] [address]    REPTSTATUS 11/01/2021 FINAL 10/27/2021 1036    Cardiac Enzymes: No results for input(s): CKTOTAL, CKMB, CKMBINDEX, TROPONINI in the last 168 hours. CBG: Recent Labs  Lab 11/05/21 1130 11/05/21 1610 11/05/21 2236 11/06/21 0734 11/06/21 1133  GLUCAP 238* 119* 149* 192* 160*   Iron Studies: No results for input(s): IRON, TIBC, TRANSFERRIN, FERRITIN in the last 72  hours. Lab Results  Component Value Date   INR 3.0 (H) 10/30/2021   INR 1.2 07/22/2020   INR 1.2 05/26/2020   Studies/Results: No results found.  Medications:  sodium chloride 10 mL/hr at 11/03/21 0730   sodium chloride     sodium chloride     [START ON 11/07/2021] ceFEPime (MAXIPIME) IV     vancomycin Stopped (11/04/21 1948)    sodium chloride   Intravenous Once   apixaban  5 mg Oral BID   benzonatate  200 mg Oral TID   calcitRIOL  0.5 mcg Oral Q T,Th,Sa-HD   Chlorhexidine Gluconate Cloth  6 each Topical Daily   darbepoetin (ARANESP) injection - DIALYSIS  200 mcg Intravenous Q Fri-HD   diltiazem  120 mg Oral Daily   feeding supplement (NEPRO CARB STEADY)  237 mL Oral BID BM   fluticasone  2 spray Each Nare Daily   furosemide  80 mg Oral Once per day on Sun Mon Wed Fri   insulin aspart  0-5 Units Subcutaneous QHS   insulin aspart  0-9 Units Subcutaneous TID WC   insulin aspart  2 Units Subcutaneous TID WC   insulin glargine-yfgn  18 Units Subcutaneous Daily   lidocaine  1 patch Transdermal Q24H   mouth rinse  15 mL Mouth Rinse BID    metoprolol tartrate  25 mg Oral BID   multivitamin  1 tablet Oral QHS   oxymetazoline  1 spray Each Nare BID   sodium chloride flush  10-40 mL Intracatheter Q12H    Dialysis Orders: GOC TTS  4h 50min  108.5kg   400/800   2/2.5 bath  TDC   AVF RUA  -No Heparin -Mircera 225 mcg IV q 2 weeks, last 12/1 -Calcitriol 0.5 mcg PO TIW  Assessment/Plan: PNA - w/ cough, mild SOB, RLL infiltrate on CXR.  On antibiotics.  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 Spoke with Pharmacy-plan for patient to receive Vancomycin 1g and Cefepime 2g with HD (TTS) until 12/08/21. Repeat blood cultures (-) to date.  Cardiology following  Blister R BKA-recommend ortho consult--defer to primary service  ESRD - on HD TTS. Short HD today to resume T,Th,S schedule.  Vascular Access- Poorly functioning RUE AVF (infiltrated w/ hematoma). S/p RIJ TDC placement 12/23. Plan is to give his AVF rest Myoclonic Jerking- Ongoing, seems improved today This has been ongoing for few months prior to admission. Doesn't appear to be d/t uremia. Currently not on Gabapentin. LFTs elevated. Current Ammonia level WNL. He will need outpatient neurologic workup. Volume: edema improving bilateral thighs and LLE-probably losing body wt (hx poor appetite for 1-2 mos).Will have lower dry weight at discharge. UF as tolerated, vol status improved HTN - BP low normal and stable. Metoprolol on med list. Decreased dose to 25 mg PO BID.  Afib/ RVR - Rate controlled, on BB, Cardizem, A/C per cardiology  Echo 12/15 -- EF 45%,  Large tricuspid valve veg , severe TVR - not a good candidate for surgery per Dr. [name]  DM2 - on insulin, per primary Anemia ckd -S/p 1 unit PRBCs 12/19. Will re-dose ESA to receive with HD 12/23. On Heparin gtt. S/p 1u prbc 12/23. Transfused prn. Avoiding Fe given #1 and #2. 1u prbc 12/24, hgb up to 9.1 today MBD ckd - Corr Ca/Phos ok.  cont  vdra  Tobie Poet, NP [facility] 11/06/2021,4:01 PM  LOS: 12 days

## 2021-11-06 NOTE — Progress Notes (Signed)
Physical Therapy Treatment Patient Details Name: Alexander [name] MRN: [id number] DOB: 07-30-1961 Today's Date: 11/06/2021   History of Present Illness 60 y.o. male presents to ED 12/14 with c/o increasing SoB and cough. Found to be in Afib with RVR, HR to 140s, hypoxic down in the 80%O2 on RA. Chest x-ray showing right basilar pneumonia with resolved left upper lobe pneumonia PMH: ESRD on HD TTS, paroxysmal atrial fibrillation on Eliquis, pacemaker, CAD s/p stent, diastolic CHF, peripheral vascular disease with right BKA, history of GI bleed, type 2 diabetes    PT Comments    Pt initially refusing therapy session this morning. PT/OT explained that in order to go home via family car, instead of PTAR transfer he has to be able to demonstrate safe transfer to wheelchair and back. Pt aggravated but ultimately agrees. Prior to transfers PT applied gauze 4x4 to wounds on R residual limb and held in place with light compression wrap, also placed foam heel pad over L heel DTI. Pt provided with education not to place weight through L heel to protect wound. Therapy recommends use of slide board for transfer however pt refuses. Pt attempts to stand with maximal use of momentum and assist of 2 and he is unable to achieve standing for pivot to wheelchair. Therapy again recommends slide board and pt relents. Pt able to use slide board and mod Ax2 for transfer to wheelchair and maxAx2 for return back to bed. PT continues to recommend SNF level rehab prior to discharge home. Pt adamantly refuses and refuses all [place] services with case manager on phone during session. In light of this PT recommending slide board to allow pt to transfer to his wheel chair and car to be able to go to dialysis.       Recommendations for follow up therapy are one component of a multi-disciplinary discharge planning process, led by the attending physician.  Recommendations may be updated based on patient status, additional functional criteria  and insurance authorization.  Follow Up Recommendations  Skilled nursing-short term rehab (<3 hours/day)     Assistance Recommended at Discharge Frequent or constant Supervision/Assistance  Equipment Recommendations  None recommended by PT (has necessary equipment)       Precautions / Restrictions Precautions Precautions: Fall Precaution Comments: wound L heel, R residual limb Restrictions Weight Bearing Restrictions: No     Mobility  Bed Mobility Overal bed mobility: Needs Assistance Bed Mobility: Sit to Supine;Supine to Sit     Supine to sit: HOB elevated;Min assist Sit to supine: Min assist   General bed mobility comments: min A for pad scoot of hips to square to bed, min A for arranging pad in bed    Transfers Overall transfer level: Needs assistance   Transfers: Bed to chair/wheelchair/BSC;Sit to/from Stand Sit to Stand: Total assist          Lateral/Scoot Transfers: Mod assist;Max assist;+2 physical assistance General transfer comment: pt attempt x3 for power up to standing on L mid foot to protect L heel, unable to achieve upright, introduced slide board to pt and he was able to scoot from bed to wheelchair with mod A for downhill to wheelchair and max A with use of bed pad to transfer back uphill to bed. Pt very encouraged to be able to more freely move with assist of the slide board           Balance Overall balance assessment: Needs assistance Sitting-balance support: Feet unsupported;No upper extremity supported;Bilateral upper extremity supported;Single extremity supported  Sitting balance-Leahy Scale: Fair                                      Cognition Arousal/Alertness: Awake/alert Behavior During Therapy: Flat affect;Impulsive;Agitated Overall Cognitive Status: No family/caregiver present to determine baseline cognitive functioning Area of Impairment: Following commands;Safety/judgement;Awareness;Problem solving                    Current Attention Level: Selective Memory: Decreased short-term memory Following Commands: Follows multi-step commands inconsistently;Follows one step commands inconsistently Safety/Judgement: Decreased awareness of safety Awareness: Intellectual Problem Solving: Slow processing;Decreased initiation;Difficulty sequencing;Requires verbal cues;Requires tactile cues General Comments: continues to have poor knowledge of safety and deficits, Case Manager on phone during session and pt refuses all [place] services. Aggitated when PT/OT state that he needs to be able to demonstrate safe wheelchair transfer prior to d/c home           General Comments General comments (skin integrity, edema, etc.): prior to transfer placed gauze 4x4 to wounds and placed compression wrap to keep in place on R residual limb, also provided foam pad to R heel prior to transfer      Pertinent Vitals/Pain Pain Assessment: Faces Faces Pain Scale: Hurts a little bit Pain Location: back with movement Pain Descriptors / Indicators: Grimacing;Guarding;Moaning Pain Intervention(s): Limited activity within patient's tolerance;Repositioned;Monitored during session     PT Goals (current goals can now be found in the care plan section) Acute Rehab PT Goals Patient Stated Goal: none stated PT Goal Formulation: With patient Time For Goal Achievement: 11/14/21 Potential to Achieve Goals: Fair Progress towards PT goals: Progressing toward goals    Frequency    Min 2X/week      PT Plan Current plan remains appropriate       AM-PAC PT "6 Clicks" Mobility   Outcome Measure  Help needed turning from your back to your side while in a flat bed without using bedrails?: None Help needed moving from lying on your back to sitting on the side of a flat bed without using bedrails?: A Lot Help needed moving to and from a bed to a chair (including a wheelchair)?: Total Help needed standing up from a chair using your  arms (e.g., wheelchair or bedside chair)?: Total Help needed to walk in hospital room?: Total Help needed climbing 3-5 steps with a railing? : Total 6 Click Score: 10    End of Session Equipment Utilized During Treatment: Gait belt Activity Tolerance: Patient tolerated treatment well Patient left: in bed;with call bell/phone within reach;with bed alarm set Nurse Communication: Mobility status PT Visit Diagnosis: Other abnormalities of gait and mobility (R26.89);Muscle weakness (generalized) (M62.81);Difficulty in walking, not elsewhere classified (R26.2);Unsteadiness on feet (R26.81)     Time: 0962-8366 PT Time Calculation (min) (ACUTE ONLY): 59 min  Charges:  $Therapeutic Activity: 23-37 mins                     Leenah Seidner B. Migdalia Dk PT, DPT Acute Rehabilitation Services Pager [phone] Office [phone]    [place] 11/06/2021, 12:08 PM

## 2021-11-06 NOTE — TOC Transition Note (Addendum)
Transition of Care [facility]) - CM/SW Discharge Note   Patient Details  Name: Alexander [name] MRN: [id number] Date of Birth: Feb 10, 1961  Transition of Care [facility] CM/SW Contact:  Zenon Mayo, RN Phone Number: 11/06/2021, 10:30 AM   Clinical Narrative:    NCM spoke with patient, NCM asked if could set [facility] services up for him, he said no, he has all the help he needs at home between his wife and kids.  NCM informed him that he may need some physical therapy, he states no he does not want that.  He states he has all the DME at home he needs as well.  His wife will be transporting him home today. Patient will need a transfer board to go home with, wife is ok with [employer] supplying this for them. This will be brought up to the room prior to dc.     Barriers to Discharge: Continued Medical Work up   Patient Goals and CMS Choice        Discharge Placement                       Discharge Plan and Services In-house Referral: NA Discharge Planning Services: CM Consult                                 Social Determinants of Health (SDOH) Interventions     Readmission Risk Interventions Readmission Risk Prevention Plan 10/27/2021 05/26/2020 03/15/2020  Transportation Screening Complete Complete Complete  PCP or Specialist Appt within 5-7 Days - - Complete  PCP or Specialist Appt within 3-5 Days Complete - -  Home Care Screening - - Complete  Medication Review (RN CM) - - Complete  HRI or Home Care Consult Complete - -  Social Work Consult for [place] Planning/Counseling Complete - -  Palliative Care Screening Not Applicable - -  Medication Review [employer]) Complete Complete -  PCP or Specialist appointment within 3-5 days of discharge - Complete -  [place] or [place] - Complete -  SW Recovery Care/Counseling Consult - Complete -  [place] - Not Applicable -  [place] - Not Applicable -  Some recent data might be  hidden

## 2021-11-06 NOTE — Care Management Important Message (Signed)
Important Message  Patient Details  Name: Alexander [name] MRN: [id number] Date of Birth: [birth year]   Medicare Important Message Given:  Yes     [name] 11/06/2021, 10:41 AM

## 2021-11-07 ENCOUNTER — Encounter: Payer: Self-pay | Admitting: *Deleted

## 2021-11-07 DIAGNOSIS — I38 Endocarditis, valve unspecified: Secondary | ICD-10-CM | POA: Diagnosis not present

## 2021-11-07 DIAGNOSIS — Z992 Dependence on renal dialysis: Secondary | ICD-10-CM | POA: Diagnosis not present

## 2021-11-07 DIAGNOSIS — D631 Anemia in chronic kidney disease: Secondary | ICD-10-CM | POA: Diagnosis not present

## 2021-11-07 DIAGNOSIS — N186 End stage renal disease: Secondary | ICD-10-CM | POA: Diagnosis not present

## 2021-11-07 DIAGNOSIS — D509 Iron deficiency anemia, unspecified: Secondary | ICD-10-CM | POA: Diagnosis not present

## 2021-11-07 DIAGNOSIS — E0821 Diabetes mellitus due to underlying condition with diabetic nephropathy: Secondary | ICD-10-CM | POA: Diagnosis not present

## 2021-11-07 DIAGNOSIS — D689 Coagulation defect, unspecified: Secondary | ICD-10-CM | POA: Diagnosis not present

## 2021-11-07 DIAGNOSIS — N2581 Secondary hyperparathyroidism of renal origin: Secondary | ICD-10-CM | POA: Diagnosis not present

## 2021-11-07 NOTE — TOC Transition Note (Signed)
Transition of care contact from inpatient facility  Date of discharge: 11/06/21 Date of contact: 11/07/21 Method: Phone Spoke to: Patient's Daughter  Patient contacted to discuss transition of care from recent inpatient hospitalization. Patient was admitted to [facility] from 10/25/21-11/06/21 with discharge diagnosis of Tricuspid Valve Vegetation, Afib with RVR, Malfunction R AVF-s/p R IJ TDC placement.  Medication changes were reviewed.  Patient daughter reports patient went to scheduled HD today at [place]. Plan for renal team to see patient at next scheduled HD.  Tobie Poet, NP

## 2021-11-08 ENCOUNTER — Other Ambulatory Visit: Payer: Self-pay

## 2021-11-08 ENCOUNTER — Telehealth: Payer: Self-pay

## 2021-11-08 ENCOUNTER — Encounter: Payer: Self-pay | Admitting: Internal Medicine

## 2021-11-08 ENCOUNTER — Ambulatory Visit (INDEPENDENT_AMBULATORY_CARE_PROVIDER_SITE_OTHER): Payer: Medicare Other | Admitting: Internal Medicine

## 2021-11-08 ENCOUNTER — Ambulatory Visit (INDEPENDENT_AMBULATORY_CARE_PROVIDER_SITE_OTHER): Payer: Medicare Other | Admitting: Family

## 2021-11-08 ENCOUNTER — Encounter: Payer: Self-pay | Admitting: Family

## 2021-11-08 VITALS — BP 132/85 | Temp 98.1°F

## 2021-11-08 DIAGNOSIS — L02415 Cutaneous abscess of right lower limb: Secondary | ICD-10-CM | POA: Diagnosis not present

## 2021-11-08 DIAGNOSIS — T827XXD Infection and inflammatory reaction due to other cardiac and vascular devices, implants and grafts, subsequent encounter: Secondary | ICD-10-CM

## 2021-11-08 DIAGNOSIS — T148XXA Other injury of unspecified body region, initial encounter: Secondary | ICD-10-CM | POA: Diagnosis not present

## 2021-11-08 DIAGNOSIS — I330 Acute and subacute infective endocarditis: Secondary | ICD-10-CM | POA: Diagnosis not present

## 2021-11-08 DIAGNOSIS — Z89511 Acquired absence of right leg below knee: Secondary | ICD-10-CM | POA: Diagnosis not present

## 2021-11-08 DIAGNOSIS — Z89432 Acquired absence of left foot: Secondary | ICD-10-CM | POA: Diagnosis not present

## 2021-11-08 NOTE — Telephone Encounter (Signed)
Ortho office called Dr. Juleen China requesting that patient be seen by one of our providers today.   Spoke with patient's wife, she accepts appointment for this afternoon with Dr. Linus Salmons.   Beryle Flock, RN

## 2021-11-08 NOTE — Progress Notes (Signed)
Office Visit Note   Patient: Alexander [name]           Date of Birth: 09/05/1961           MRN: [id number] Visit Date: 11/08/2021              Requested by: [name], MD [address] [place],  [place] [address] PCP: [name], MD  Chief Complaint  Patient presents with   Right Foot - Wound Check   Left Foot - Wound Check      HPI: The patient is a 60 year old gentleman who presents today concern for new blisters to his left foot as well as his right residual limb he is status post transmetatarsal amputation on the left as well as below-knee amputation on the right.  He has some areas in front of his left ankle as well he has noticed these have come on over the last week the blister to his right residual limb is new he states this began when he was last hospitalized.  He unfortunately had a prolonged hospitalization in November as well as December.  Most recently he was hospitalized for A. fib with RVR as well as pneumonia, transaminitis, tricuspid valve vegetation as well as diabetes with end-stage renal disease.  He recently had his dialysis access changed over to a right IJ.  This was placed on December 23.  He is on vancomycin as well as cefepime IV while at dialysis.  Despite these he has new abscesses  Denies fevers or chills.  Assessment & Plan: Visit Diagnoses:  1. Cutaneous abscess of right lower limb   2. Hx of BKA, right ([place])   3. S/P transmetatarsal amputation of foot, left ([place])     Plan: Concern for multiple new MRSA abscesses to his left foot and right residual limb.  Is on vancomycin as well as cefepime.  As prescribed by ID for vegetation of his heart valve spoke with ID this morning they will see him later this afternoon.  Follow-Up Instructions: Return in about 2 weeks (around 11/22/2021).   Ortho Exam  Patient is alert, oriented, no adenopathy, well-dressed, normal affect, normal respiratory effort. On examination of the left foot he  does have significant edema however he relates this is in proved over the last several weeks.  There is no erythema or weeping.  He does have pitting there are multiple 2 mm in diameter all abscesses over his anterior ankle joint as well as to the end of his amputation.  These have purulent drainage.  There is no surrounding erythema there is not a foul odor to his right residual limb he has significant edema there is no erythema no warmth no weeping he does have a 4 cm in diameter blistered ulceration to the distal tip this was lanced today there is serosanguineous drainage.  Proximal to this he does have 3 purulent small abscesses.  These were cultured.  Imaging: No results found. No images are attached to the encounter.  Labs: Lab Results  Component Value Date   HGBA1C 6.8 (H) 10/29/2021   HGBA1C 5.5 05/23/2020   HGBA1C 8.6 (H) 01/27/2020   REPTSTATUS 11/01/2021 FINAL 10/27/2021   GRAMSTAIN  01/26/2020    RARE WBC PRESENT,BOTH PMN AND MONONUCLEAR FEW GRAM POSITIVE COCCI IN PAIRS    CULT  10/27/2021    NO GROWTH 5 DAYS Performed at [facility], [place] [address], [place], [place] [address]    Zinc MITIS/ORALIS 01/26/2020  Lab Results  Component Value Date   ALBUMIN 1.8 (L) 11/06/2021   ALBUMIN 2.0 (L) 11/05/2021   ALBUMIN 1.7 (L) 11/04/2021    Lab Results  Component Value Date   MG 1.9 07/19/2020   MG 1.8 05/25/2020   MG 2.0 10/07/2014   No results found for: VD25OH  No results found for: PREALBUMIN CBC EXTENDED Latest Ref Rng & Units 11/06/2021 11/05/2021 11/04/2021  WBC 4.0 - 10.5 K/uL - 11.1(H) 10.8(H)  RBC 4.22 - 5.81 MIL/uL - 3.12(L) 2.68(L)  HGB 13.0 - 17.0 g/dL 9.1(L) 8.7(L) 7.5(L)  HCT 39.0 - 52.0 % 30.2(L) 28.9(L) 24.8(L)  PLT 150 - 400 K/uL - 93(L) 90(L)  NEUTROABS 1.7 - 7.7 K/uL - - -  LYMPHSABS 0.7 - 4.0 K/uL - - -     There is no height or weight on file to calculate BMI.  Orders:  Orders Placed This Encounter  Procedures    Wound culture   No orders of the defined types were placed in this encounter.    Procedures: No procedures performed  Clinical Data: No additional findings.  ROS:  All other systems negative, except as noted in the HPI. Review of Systems  Objective: Vital Signs: There were no vitals taken for this visit.  Specialty Comments:  No specialty comments available.  PMFS History: Patient Active Problem List   Diagnosis Date Noted   Malnutrition of moderate degree 11/02/2021   Pacemaker infection ([place])    Transaminitis    Tricuspid valve vegetation    Thrombocytopenia ([place]) 10/26/2021   Hyponatremia 10/26/2021   Acute on chronic diastolic CHF (congestive heart failure) ([place]) 10/26/2021   Atrial fibrillation with RVR ([place]) 10/25/2021   Symptomatic anemia 09/30/2021   ESRD (end stage renal disease) ([place]) 09/30/2021   PAF (paroxysmal atrial fibrillation) ([place]) 09/30/2021   NSVT (nonsustained ventricular tachycardia)    Pancytopenia ([place])    Acute blood loss anemia 07/18/2020   Hemodialysis patient ([place]) 06/04/2020   Necrotizing glomerulonephritis 06/04/2020   Heme positive stool    Melena    Upper GI bleeding 05/23/2020   Acute GI bleeding 05/23/2020   Acute hematogenous osteomyelitis of right foot ([place]) 03/11/2020   Gangrene of right foot ([place])    Diabetic foot ulcer associated with type 2 diabetes mellitus ([place]) 02/19/2020   Heel ulcer ([place]) 11/05/2019   Atherosclerotic PVD with ulceration ([place]) 09/09/2017   Essential hypertension, benign 02/07/2015   Renal insufficiency 02/07/2015   Post PTCA 11/09/2014   S/P PTCA (percutaneous transluminal coronary angioplasty) 11/09/2014   Pacemaker: [place] DR MRI [id number] Dual chamber pacemaker 10/08/2014 10/08/2014   Encounter for care of pacemaker 10/08/2014   Sinus node dysfunction (HCC) 10/08/2014   Cardiac asystole ([place]) 10/07/2014   Cardiac syncope 10/07/2014   Diabetes  mellitus with stage 3 chronic kidney disease ([place]) 10/07/2014   CAD (coronary artery disease), native coronary artery 10/07/2014   Diabetes mellitus due to underlying condition without complications ([place]) 16/05/3709   Anemia, unspecified 05/25/2014   Syncope 04/30/2014   Atherosclerosis of native arteries of the extremities with gangrene ([place]) 12/16/2013   Volume overload 10/30/2013   Acute renal failure ([place]) 10/28/2013   PVD (peripheral vascular disease) ([place]) 10/27/2013   Leukocytosis 10/21/2013   Anemia 10/21/2013   Osteomyelitis of left foot ([place]) 08/19/2013   Spinal stenosis, lumbar region, with neurogenic claudication 06/12/2012    Class: Diagnosis of   Diabetes mellitus with end stage renal disease ([place]) 03/15/2009   ALCOHOL ABUSE 03/15/2009   TOBACCO  USE 03/15/2009   HYPERTENSION 03/15/2009   Past Medical History:  Diagnosis Date   Acute blood loss anemia    Alcohol abuse    Anemia    Anemia of chronic disease    Arthritis    "patient does not think so."   Atrial fibrillation ([place])    Cardiac syncope 10/07/2014   rapid A fib with 8 sec pauses on converison with syncope- temp pacing wire placed then PPM   Cataract    BILATERAL   Coronary artery disease    Diabetes mellitus    dx---been  awhile   Elevated LFTs    Endocarditis of tricuspid valve    ESRD (end stage renal disease) ([place])    [place]   GERD (gastroesophageal reflux disease)    History of blood transfusion    Hyperlipemia    Hypertension    Malnutrition ([place])    Osteomyelitis (HCC)    right foot   Peripheral vascular disease ([place])    Presence of permanent cardiac pacemaker 10/08/2014   Medtronic   Tricuspid valve vegetation     Family History  Problem Relation Age of Onset   Diabetes type II Mother    Hypertension Mother    Diabetes Mother    Liver cancer Father    Diabetes type II Sister    Breast cancer Sister    Diabetes  Sister    Hypertension Sister    Diabetes type II Brother    Kidney failure Brother    Diabetes Brother    Hypertension Brother    Diabetes type II Sister     Past Surgical History:  Procedure Laterality Date   A/V FISTULAGRAM Right 05/01/2021   Procedure: A/V FISTULAGRAM;  Surgeon: Waynetta Sandy, MD;  Location: [facility];  Service: Cardiovascular;  Laterality: Right;  failed fistulagram   ABDOMINAL AORTOGRAM W/LOWER EXTREMITY N/A 09/09/2017   Procedure: ABDOMINAL AORTOGRAM W/LOWER EXTREMITY;  Surgeon: Angelia Mould, MD;  Location: [place] [facility];  Service: Cardiovascular;  Laterality: N/A;   ABDOMINAL AORTOGRAM W/LOWER EXTREMITY Right 11/11/2019   Procedure: ABDOMINAL AORTOGRAM W/LOWER EXTREMITY;  Surgeon: Marty Heck, MD;  Location: [facility];  Service: Cardiovascular;  Laterality: Right;   AMPUTATION Left 08/19/2013   Procedure: AMPUTATION RAY;  Surgeon: Alta Corning, MD;  Location: [facility];  Service: Orthopedics;  Laterality: Left;  ray amputation left 5th   AMPUTATION Left 10/27/2013   Procedure: AMPUTATION DIGIT-LEFT 4TH TOE, 4th and 5th metatarsal.;  Surgeon: Angelia Mould, MD;  Location: [facility];  Service: Vascular;  Laterality: Left;   AMPUTATION Left 11/04/2013   Procedure: LEFT FOOT TRANS-METATARSAL AMPUTATION WITH WOUND CLOSURE ;  Surgeon: Alta Corning, MD;  Location: [facility];  Service: Orthopedics;  Laterality: Left;   AMPUTATION Right 09/10/2017   Procedure: RIGHT FOURTH AND FIFTH TOE AMPUTATION;  Surgeon: Angelia Mould, MD;  Location: [facility];  Service: Vascular;  Laterality: Right;   AMPUTATION Right 02/19/2020   Procedure: AMPUTATION RIGHT 3RD TOE;  Surgeon: Angelia Mould, MD;  Location: [facility];  Service: Vascular;  Laterality: Right;   AMPUTATION Right 03/11/2020   Procedure: RIGHT BELOW KNEE AMPUTATION;  Surgeon: Newt Minion, MD;  Location: [place] [facility];  Service: Orthopedics;  Laterality:  Right;   APPLICATION OF WOUND VAC Right 02/19/2020   Procedure: Application Of Wound Vac Right foot;  Surgeon: Angelia Mould, MD;  Location: [facility];  Service: Vascular;  Laterality: Right;  AV FISTULA PLACEMENT Right 03/27/2021   Procedure: RIGHT ARM ARTERIOVENOUS (AV) FISTULA;  Surgeon: Angelia Mould, MD;  Location: [facility];  Service: Vascular;  Laterality: Right;   AV FISTULA PLACEMENT Right 05/10/2021   Procedure: RIGHT ARM ARTERIOVENOUS (AV) FISTULA;  Surgeon: Waynetta Sandy, MD;  Location: [facility];  Service: Vascular;  Laterality: Right;   [place] Right 08/11/2021   Procedure: RIGHT ARM SECOND STAGE BASILIC VEIN TRANSPOSITION;  Surgeon: Waynetta Sandy, MD;  Location: [facility];  Service: Vascular;  Laterality: Right;   BIOPSY  07/20/2020   Procedure: BIOPSY;  Surgeon: Irving Copas., MD;  Location: [facility];  Service: Gastroenterology;;   CARDIAC CATHETERIZATION  10/07/2014   Procedure: LEFT HEART CATH AND CORONARY ANGIOGRAPHY;  Surgeon: Laverda Page, MD;  Location: [facility] LAB;  Service: Cardiovascular;;   COLONOSCOPY W/ POLYPECTOMY     COLONOSCOPY WITH PROPOFOL N/A 05/25/2020   Procedure: COLONOSCOPY WITH PROPOFOL;  Surgeon: Gatha Mayer, MD;  Location: [facility];  Service: Endoscopy;  Laterality: N/A;   EMBOLECTOMY Right 09/12/2017   Procedure: Thrombectomy  & Redo Right Below Knee Popliteal Artery Bypass Graft  ;  Surgeon: Waynetta Sandy, MD;  Location: [facility] OR;  Service: Vascular;  Laterality: Right;   ENTEROSCOPY N/A 07/20/2020   Procedure: ENTEROSCOPY;  Surgeon: Rush Landmark Telford Nab., MD;  Location: [facility];  Service: Gastroenterology;  Laterality: N/A;   ESOPHAGOGASTRODUODENOSCOPY (EGD) WITH PROPOFOL N/A 05/24/2020   Procedure: ESOPHAGOGASTRODUODENOSCOPY (EGD) WITH PROPOFOL;  Surgeon: Ladene Artist, MD;  Location: [facility];  Service: Endoscopy;  Laterality:  N/A;   EYE SURGERY Bilateral    cataract   FEMORAL-TIBIAL BYPASS GRAFT Left 10/27/2013   Procedure: BYPASS GRAFT LEFT FEMORAL- POSTERIOR TIBIAL ARTERY;  Surgeon: Angelia Mould, MD;  Location: [facility];  Service: Vascular;  Laterality: Left;   FEMORAL-TIBIAL BYPASS GRAFT Right 09/10/2017   Procedure: RIGHT SUPERFICIAL  FEMORAL ARTERY-BELOW KNEE POPLITEAL ARTERY BYPASS GRAFT WITH VEIN;  Surgeon: Angelia Mould, MD;  Location: [facility];  Service: Vascular;  Laterality: Right;   GIVENS CAPSULE STUDY N/A 07/20/2020   Procedure: GIVENS CAPSULE STUDY;  Surgeon: Irving Copas., MD;  Location: [facility];  Service: Gastroenterology;  Laterality: N/A;  capsule placed through scope into duodenum @ 0936   I & D EXTREMITY Left 10/27/2013   Procedure: IRRIGATION AND DEBRIDEMENT EXTREMITY- LEFT FOOT;  Surgeon: Angelia Mould, MD;  Location: [place];  Service: Vascular;  Laterality: Left;   I & D EXTREMITY Right 01/26/2020   Procedure: IRRIGATION AND DEBRIDEMENT EXTREMITY RIGHT FOOT WOUND;  Surgeon: Angelia Mould, MD;  Location: [facility];  Service: Vascular;  Laterality: Right;   INSERT / REPLACE / REMOVE PACEMAKER     INSERTION OF DIALYSIS CATHETER Right 11/03/2021   Procedure: RIGHT INTERNAL JUGULAR INSERTION OF TUNNELED DIALYSIS CATHETER;  Surgeon: Angelia Mould, MD;  Location: [facility];  Service: Vascular;  Laterality: Right;   INTRAOPERATIVE ARTERIOGRAM Right 09/10/2017   Procedure: INTRA OPERATIVE ARTERIOGRAM;  Surgeon: Angelia Mould, MD;  Location: [place] [place];  Service: Vascular;  Laterality: Right;   IR ANGIOGRAM FOLLOW UP STUDY  09/09/2017   IR FLUORO GUIDE CV LINE RIGHT  05/31/2020   IR US GUIDE VASC ACCESS RIGHT  06/01/2020   LEFT HEART CATH AND CORONARY ANGIOGRAPHY N/A 08/19/2020   Procedure: LEFT HEART CATH AND CORONARY ANGIOGRAPHY;  Surgeon: Adrian Prows, MD;  Location: [facility];  Service: Cardiovascular;  Laterality: N/A;   LEFT  HEART CATHETERIZATION WITH CORONARY ANGIOGRAM N/A 11/09/2014   Procedure: LEFT HEART CATHETERIZATION WITH CORONARY ANGIOGRAM;  Surgeon: Laverda Page, MD;  Location: [facility];  Service: Cardiovascular;  Laterality: N/A;   LOWER EXTREMITY ANGIOGRAM Right 11/08/2015   Procedure: Lower Extremity Angiogram;  Surgeon: Serafina Mitchell, MD;  Location: [facility];  Service: Cardiovascular;  Laterality: Right;   LUMBAR LAMINECTOMY  06/13/2012   Procedure: MICRODISCECTOMY LUMBAR LAMINECTOMY;  Surgeon: Jessy Oto, MD;  Location: [place];  Service: Orthopedics;  Laterality: N/A;  Central laminectomy L2-3, L3-4, L4-5   PACEMAKER INSERTION  10/08/2014   MDT [employer]visa MRI compatible dual chamber pacemaker implanted by Dr Caryl Comes for syncope with post-termination pauses   PERCUTANEOUS CORONARY STENT INTERVENTION (PCI-S)  11/09/2014   des to lad & distal circumflex         Dr  [name]   PERIPHERAL VASCULAR BALLOON ANGIOPLASTY Right 11/11/2019   Procedure: PERIPHERAL VASCULAR BALLOON ANGIOPLASTY;  Surgeon: Marty Heck, MD;  Location: [facility];  Service: Cardiovascular;  Laterality: Right;  below knee popliteal, tibioperoneal trunk, posterior tibial arteries   PERIPHERAL VASCULAR CATHETERIZATION N/A 11/08/2015   Procedure: Abdominal Aortogram;  Surgeon: Serafina Mitchell, MD;  Location: [facility];  Service: Cardiovascular;  Laterality: N/A;   PERIPHERAL VASCULAR CATHETERIZATION Right 11/08/2015   Procedure: Peripheral Vascular Atherectomy;  Surgeon: Serafina Mitchell, MD;  Location: [facility];  Service: Cardiovascular;  Laterality: Right;   PERIPHERAL VASCULAR CATHETERIZATION N/A 10/10/2016   Procedure: Lower Extremity Angiography;  Surgeon: Waynetta Sandy, MD;  Location: [facility];  Service: Cardiovascular;  Laterality: N/A;   PERIPHERAL VASCULAR CATHETERIZATION Right 10/10/2016   Procedure: Peripheral Vascular Atherectomy;  Surgeon: Waynetta Sandy, MD;  Location: [facility];  Service: Cardiovascular;  Laterality: Right;  Popliteal   PERMANENT PACEMAKER INSERTION N/A 10/08/2014   Procedure: PERMANENT PACEMAKER INSERTION;  Surgeon: Deboraha Sprang, MD;  Location: [facility] CATH LAB;  Service: Cardiovascular;  Laterality: N/A;   SUBMUCOSAL TATTOO INJECTION  07/20/2020   Procedure: SUBMUCOSAL TATTOO INJECTION;  Surgeon: Irving Copas., MD;  Location: [place];  Service: Gastroenterology;;   TEE WITHOUT CARDIOVERSION N/A 10/27/2021   Procedure: TRANSESOPHAGEAL ECHOCARDIOGRAM (TEE);  Surgeon: Adrian Prows, MD;  Location: [facility] ENDOSCOPY;  Service: Cardiovascular;  Laterality: N/A;   TEMPORARY PACEMAKER INSERTION Bilateral 10/07/2014   Procedure: TEMPORARY PACEMAKER INSERTION;  Surgeon: Laverda Page, MD;  Location[facility] C[facility] LAB;  Service: Cardiovascular;  Laterality: Bilateral;   WOUND DEBRIDEMENT Right 02/19/2020   Procedure: DEBRIDEMENT WOUND RIGHT FOOT;  Surgeon: Angelia Mould, MD;  Location: [facility] OR;  Service: Vascular;  Laterality: Right;   Social History   Occupational History   Occupation: Disabled  Tobacco Use   Smoking status: Former    Packs/day: 1.50    Years: 30.00    Pack years: 45.00    Types: Cigarettes    Quit date: 10/07/2014    Years since quitting: 7.0   Smokeless tobacco: Never  Vaping Use   Vaping Use: Never used  Substance and Sexual Activity   Alcohol use: Yes    Alcohol/week: 21.0 standard drinks    Types: 21 Glasses of wine per week    Comment: cut way back, none in the last month   Drug use: No   Sexual activity: Not on file

## 2021-11-09 ENCOUNTER — Encounter: Payer: Self-pay | Admitting: Internal Medicine

## 2021-11-09 DIAGNOSIS — D509 Iron deficiency anemia, unspecified: Secondary | ICD-10-CM | POA: Diagnosis not present

## 2021-11-09 DIAGNOSIS — Z992 Dependence on renal dialysis: Secondary | ICD-10-CM | POA: Diagnosis not present

## 2021-11-09 DIAGNOSIS — D631 Anemia in chronic kidney disease: Secondary | ICD-10-CM | POA: Diagnosis not present

## 2021-11-09 DIAGNOSIS — N2581 Secondary hyperparathyroidism of renal origin: Secondary | ICD-10-CM | POA: Diagnosis not present

## 2021-11-09 DIAGNOSIS — D689 Coagulation defect, unspecified: Secondary | ICD-10-CM | POA: Diagnosis not present

## 2021-11-09 DIAGNOSIS — I38 Endocarditis, valve unspecified: Secondary | ICD-10-CM | POA: Diagnosis not present

## 2021-11-09 DIAGNOSIS — N186 End stage renal disease: Secondary | ICD-10-CM | POA: Diagnosis not present

## 2021-11-09 DIAGNOSIS — E0821 Diabetes mellitus due to underlying condition with diabetic nephropathy: Secondary | ICD-10-CM | POA: Diagnosis not present

## 2021-11-09 NOTE — Progress Notes (Signed)
° °  Subjective:    Patient ID: Alexander [name], male    DOB: [birth year], 60 y.o.   MRN: [id number]  HPI He comes in for a work in visit for follow up of bilateral blisters on his stump sites. He has a history of ESRD, afib, pacemaker and found to have a pacemaker vegetattion and TV vegetation. He was deemed not to be a candidate for removal of the device and cultures remained negative.  He has been on vancomycin and cefepime with dialysis.   He was referred today to be seen from the orthopedic office with concern for abscesses of both legs.  Note reviewed from orthopedics after the visit and there was concern for several areas over the left foot and right stump area.  These were unroofed and once sent for culture.     Review of Systems  Constitutional:  Negative for chills and fever.  Gastrointestinal:  Negative for diarrhea and nausea.  Skin:  Negative for rash.      Objective:   Physical Exam Eyes:     General: No scleral icterus. Musculoskeletal:     Comments: 1-2+ pitting edema on left leg and edema at stump Two small areas at site of bllister on left foot/ankle with clear fluid, no surrounding erythema, no purulence Also an area at the site of the right stump with blistering with clear fluid, no purulence, no surrounding erythema.    Skin:    Findings: No rash.  Neurological:     Mental Status: He is alert.   SH: no tobacco       Assessment & Plan:

## 2021-11-09 NOTE — Assessment & Plan Note (Signed)
On treatment for his vegetation and plan to continue for culture negative endocarditis with vancomycin and cefepime with dialysis.

## 2021-11-09 NOTE — Assessment & Plan Note (Addendum)
He continues on vancomycin and cefepime and no changes.  He will follow up and transition him to oral therapy toward the end of his treatment course.   He will follow up with Dr. Gale Journey as scheduled.

## 2021-11-09 NOTE — Assessment & Plan Note (Signed)
This is a new problem. Several areas on both legs with blisters.  I suspect for his right stump, it is related to his prosthesis and rubbing.  I have advised him to reevaluate the prosthesis with his provider.   No pustules appear infected on exam, no pus.  No indication for any additional antibiotic treatment.

## 2021-11-11 DIAGNOSIS — N186 End stage renal disease: Secondary | ICD-10-CM | POA: Diagnosis not present

## 2021-11-11 DIAGNOSIS — D631 Anemia in chronic kidney disease: Secondary | ICD-10-CM | POA: Diagnosis not present

## 2021-11-11 DIAGNOSIS — E0821 Diabetes mellitus due to underlying condition with diabetic nephropathy: Secondary | ICD-10-CM | POA: Diagnosis not present

## 2021-11-11 DIAGNOSIS — D689 Coagulation defect, unspecified: Secondary | ICD-10-CM | POA: Diagnosis not present

## 2021-11-11 DIAGNOSIS — N179 Acute kidney failure, unspecified: Secondary | ICD-10-CM | POA: Diagnosis not present

## 2021-11-11 DIAGNOSIS — I38 Endocarditis, valve unspecified: Secondary | ICD-10-CM | POA: Diagnosis not present

## 2021-11-11 DIAGNOSIS — Z992 Dependence on renal dialysis: Secondary | ICD-10-CM | POA: Diagnosis not present

## 2021-11-11 DIAGNOSIS — D509 Iron deficiency anemia, unspecified: Secondary | ICD-10-CM | POA: Diagnosis not present

## 2021-11-11 DIAGNOSIS — N2581 Secondary hyperparathyroidism of renal origin: Secondary | ICD-10-CM | POA: Diagnosis not present

## 2021-11-12 LAB — WOUND CULTURE
MICRO NUMBER:: 12804224
RESULT:: NO GROWTH
SPECIMEN QUALITY:: ADEQUATE

## 2021-11-14 DIAGNOSIS — D689 Coagulation defect, unspecified: Secondary | ICD-10-CM | POA: Diagnosis not present

## 2021-11-14 DIAGNOSIS — D631 Anemia in chronic kidney disease: Secondary | ICD-10-CM | POA: Diagnosis not present

## 2021-11-14 DIAGNOSIS — D509 Iron deficiency anemia, unspecified: Secondary | ICD-10-CM | POA: Diagnosis not present

## 2021-11-14 DIAGNOSIS — N2581 Secondary hyperparathyroidism of renal origin: Secondary | ICD-10-CM | POA: Diagnosis not present

## 2021-11-14 DIAGNOSIS — Z992 Dependence on renal dialysis: Secondary | ICD-10-CM | POA: Diagnosis not present

## 2021-11-14 DIAGNOSIS — N186 End stage renal disease: Secondary | ICD-10-CM | POA: Diagnosis not present

## 2021-11-14 DIAGNOSIS — E0821 Diabetes mellitus due to underlying condition with diabetic nephropathy: Secondary | ICD-10-CM | POA: Diagnosis not present

## 2021-11-14 DIAGNOSIS — I38 Endocarditis, valve unspecified: Secondary | ICD-10-CM | POA: Diagnosis not present

## 2021-11-16 DIAGNOSIS — D509 Iron deficiency anemia, unspecified: Secondary | ICD-10-CM | POA: Diagnosis not present

## 2021-11-16 DIAGNOSIS — D631 Anemia in chronic kidney disease: Secondary | ICD-10-CM | POA: Diagnosis not present

## 2021-11-16 DIAGNOSIS — N186 End stage renal disease: Secondary | ICD-10-CM | POA: Diagnosis not present

## 2021-11-16 DIAGNOSIS — E0821 Diabetes mellitus due to underlying condition with diabetic nephropathy: Secondary | ICD-10-CM | POA: Diagnosis not present

## 2021-11-16 DIAGNOSIS — D689 Coagulation defect, unspecified: Secondary | ICD-10-CM | POA: Diagnosis not present

## 2021-11-16 DIAGNOSIS — N2581 Secondary hyperparathyroidism of renal origin: Secondary | ICD-10-CM | POA: Diagnosis not present

## 2021-11-16 DIAGNOSIS — Z992 Dependence on renal dialysis: Secondary | ICD-10-CM | POA: Diagnosis not present

## 2021-11-16 DIAGNOSIS — I38 Endocarditis, valve unspecified: Secondary | ICD-10-CM | POA: Diagnosis not present

## 2021-11-18 DIAGNOSIS — N186 End stage renal disease: Secondary | ICD-10-CM | POA: Diagnosis not present

## 2021-11-18 DIAGNOSIS — Z992 Dependence on renal dialysis: Secondary | ICD-10-CM | POA: Diagnosis not present

## 2021-11-18 DIAGNOSIS — D689 Coagulation defect, unspecified: Secondary | ICD-10-CM | POA: Diagnosis not present

## 2021-11-18 DIAGNOSIS — D509 Iron deficiency anemia, unspecified: Secondary | ICD-10-CM | POA: Diagnosis not present

## 2021-11-18 DIAGNOSIS — D631 Anemia in chronic kidney disease: Secondary | ICD-10-CM | POA: Diagnosis not present

## 2021-11-18 DIAGNOSIS — E0821 Diabetes mellitus due to underlying condition with diabetic nephropathy: Secondary | ICD-10-CM | POA: Diagnosis not present

## 2021-11-18 DIAGNOSIS — I38 Endocarditis, valve unspecified: Secondary | ICD-10-CM | POA: Diagnosis not present

## 2021-11-18 DIAGNOSIS — N2581 Secondary hyperparathyroidism of renal origin: Secondary | ICD-10-CM | POA: Diagnosis not present

## 2021-11-20 DIAGNOSIS — E1151 Type 2 diabetes mellitus with diabetic peripheral angiopathy without gangrene: Secondary | ICD-10-CM | POA: Diagnosis not present

## 2021-11-20 DIAGNOSIS — Z125 Encounter for screening for malignant neoplasm of prostate: Secondary | ICD-10-CM | POA: Diagnosis not present

## 2021-11-20 DIAGNOSIS — E7800 Pure hypercholesterolemia, unspecified: Secondary | ICD-10-CM | POA: Diagnosis not present

## 2021-11-20 DIAGNOSIS — I1311 Hypertensive heart and chronic kidney disease without heart failure, with stage 5 chronic kidney disease, or end stage renal disease: Secondary | ICD-10-CM | POA: Diagnosis not present

## 2021-11-21 DIAGNOSIS — D689 Coagulation defect, unspecified: Secondary | ICD-10-CM | POA: Diagnosis not present

## 2021-11-21 DIAGNOSIS — N186 End stage renal disease: Secondary | ICD-10-CM | POA: Diagnosis not present

## 2021-11-21 DIAGNOSIS — I38 Endocarditis, valve unspecified: Secondary | ICD-10-CM | POA: Diagnosis not present

## 2021-11-21 DIAGNOSIS — D631 Anemia in chronic kidney disease: Secondary | ICD-10-CM | POA: Diagnosis not present

## 2021-11-21 DIAGNOSIS — Z992 Dependence on renal dialysis: Secondary | ICD-10-CM | POA: Diagnosis not present

## 2021-11-21 DIAGNOSIS — N2581 Secondary hyperparathyroidism of renal origin: Secondary | ICD-10-CM | POA: Diagnosis not present

## 2021-11-21 DIAGNOSIS — E0821 Diabetes mellitus due to underlying condition with diabetic nephropathy: Secondary | ICD-10-CM | POA: Diagnosis not present

## 2021-11-21 DIAGNOSIS — D509 Iron deficiency anemia, unspecified: Secondary | ICD-10-CM | POA: Diagnosis not present

## 2021-11-22 ENCOUNTER — Ambulatory Visit (INDEPENDENT_AMBULATORY_CARE_PROVIDER_SITE_OTHER): Payer: Medicare Other | Admitting: Internal Medicine

## 2021-11-22 ENCOUNTER — Other Ambulatory Visit: Payer: Self-pay

## 2021-11-22 VITALS — BP 111/77 | HR 84 | Temp 97.7°F

## 2021-11-22 DIAGNOSIS — T827XXD Infection and inflammatory reaction due to other cardiac and vascular devices, implants and grafts, subsequent encounter: Secondary | ICD-10-CM | POA: Diagnosis not present

## 2021-11-22 MED ORDER — DOXYCYCLINE HYCLATE 100 MG PO TABS: 100.0000 mg | ORAL_TABLET | Freq: Two times a day (BID) | ORAL | 11 refills | Status: DC

## 2021-11-22 MED ORDER — AMOXICILLIN 500 MG PO CAPS: 500.0000 mg | ORAL_CAPSULE | Freq: Every day | ORAL | 11 refills | Status: DC

## 2021-11-22 NOTE — Patient Instructions (Signed)
Finish your Intravenous antibiotics (vancomycin and cefepime) on 12/08/21  On 12/09/21, start taking these two antibiotics: Amoxicillin 500 mg once a day at bedtime Doxycycline 100 mg twice a day   These will be indefinite   Follow up with ID clinic in 4-6 weeks   See dr [name] for your right stump blister

## 2021-11-22 NOTE — Progress Notes (Signed)
Subjective:    Patient ID: Alexander [name], male    DOB: 1960/11/26, 61 y.o.   MRN: [id number]  HPI He comes in for a work in visit for follow up of bilateral blisters on his stump sites and pacemaker infection  He last saw dr Linus Salmons in clinic on 11/08/21: He has a history of ESRD, afib, pacemaker and found to have a pacemaker vegetattion and TV vegetation. He was deemed not to be a candidate for removal of the device and cultures remained negative.  He has been on vancomycin and cefepime with dialysis.   He was referred today to be seen from the orthopedic office with concern for abscesses of both legs.  Note reviewed from orthopedics after the visit and there was concern for several areas over the left foot and right stump area.  These were unroofed and once sent for culture.    11/22/21 id clinic f/u 12/28 stump tissue cx negative He has planned 6 weeks vanc cefepime with dialysis till 12/08/21. I will transition him to doxy/amox once this is done Has right chest hd port which is used for dialysis now; fistula not currently used    ROS: All other ros negative     Objective:   Physical exam Vitals:   11/22/21 1016  BP: 111/77  Pulse: 84  Temp: 97.7 F (36.5 C)   General/constitutional: no distress, pleasant HEENT: Normocephalic, PER, Conj Clear, EOMI, Oropharynx clear Neck supple CV: rrr no mrg -- left pacer site no erythema/tenderness Lungs: clear to auscultation, normal respiratory effort Abd: Soft, Nontender Ext: no edema Skin: No Rash Neuro: nonfocal MSK: s/p right bka and left tma. Right bka stump examined see pic below  Line: right HD port site clean no erythema/pyrulence         Assessment & Plan:   Lines: Right UE chronic AVF Right chest HD line pacemaker   Abx: 12/16-c cefepime 12/16-c vanc   12/14-16 ceftriaxone 12/14-16 doxycycline   ASSESSMENT: 61 yo male with CAD, s/p pacemaker, esrd on iHD via rue avf, afib, admitted initially for presumed  pna, found to have TEE evidence of TV/pacemaker vegetations and subacute lower back pain with ct finding L2,3,5 end plate fx   Initial bcx after 2 days empiric abx, so far negative From description of the TV appearance, appears syndrome c/w endocarditis/lead infection both High risk surgical removal of pacer. Agree reasonable to try abx therapy. If worsening sepsis or new bacteremia despite what appears to be appropriate abx therapy, could revisit this topic   Lower back pain with ct showing subacute lumbar end plate fracture without abscess... ct done without contrast. Presuming infection involved tx already should cover   Lft elevated but RUQ u/s no evidence stone; no clinical syndrome cholecystitis/cholangitis or liver abscess   Exam with very poor dentition       12/20 assessment Labs significantly improving Subjectively not yet feeling well but not worseLft elevation at admission likely from sepsis; trending down.  Feeling well Still have clear thin oozing right bka stump. He has had that oozing since hospitalization  11/22/21 assessment Tolerating empiric bsAbx for pacer infection. Clinically no sign of infection/sepsis Right bka stump appears to have pressure trauma/blister and ?vasculopathy/esrd poor healing Culture negative -- doesn't appear to have cellulitic change.   Plan to transition to doxy/amoxicillin 100 mg po bid and 500 mg po qhs -- indefinitely Follow up with dr [name] for ongoing monitor of the right bka stump pressure blister  Follow up  with id clinic in 4-6 weeks

## 2021-11-23 ENCOUNTER — Inpatient Hospital Stay: Payer: Medicare Other | Admitting: Internal Medicine

## 2021-11-23 DIAGNOSIS — E0821 Diabetes mellitus due to underlying condition with diabetic nephropathy: Secondary | ICD-10-CM | POA: Diagnosis not present

## 2021-11-23 DIAGNOSIS — Z992 Dependence on renal dialysis: Secondary | ICD-10-CM | POA: Diagnosis not present

## 2021-11-23 DIAGNOSIS — N2581 Secondary hyperparathyroidism of renal origin: Secondary | ICD-10-CM | POA: Diagnosis not present

## 2021-11-23 DIAGNOSIS — D689 Coagulation defect, unspecified: Secondary | ICD-10-CM | POA: Diagnosis not present

## 2021-11-23 DIAGNOSIS — N186 End stage renal disease: Secondary | ICD-10-CM | POA: Diagnosis not present

## 2021-11-23 DIAGNOSIS — D631 Anemia in chronic kidney disease: Secondary | ICD-10-CM | POA: Diagnosis not present

## 2021-11-23 DIAGNOSIS — I38 Endocarditis, valve unspecified: Secondary | ICD-10-CM | POA: Diagnosis not present

## 2021-11-23 DIAGNOSIS — D509 Iron deficiency anemia, unspecified: Secondary | ICD-10-CM | POA: Diagnosis not present

## 2021-11-25 DIAGNOSIS — Z992 Dependence on renal dialysis: Secondary | ICD-10-CM | POA: Diagnosis not present

## 2021-11-25 DIAGNOSIS — I38 Endocarditis, valve unspecified: Secondary | ICD-10-CM | POA: Diagnosis not present

## 2021-11-25 DIAGNOSIS — D689 Coagulation defect, unspecified: Secondary | ICD-10-CM | POA: Diagnosis not present

## 2021-11-25 DIAGNOSIS — N2581 Secondary hyperparathyroidism of renal origin: Secondary | ICD-10-CM | POA: Diagnosis not present

## 2021-11-25 DIAGNOSIS — D509 Iron deficiency anemia, unspecified: Secondary | ICD-10-CM | POA: Diagnosis not present

## 2021-11-25 DIAGNOSIS — D631 Anemia in chronic kidney disease: Secondary | ICD-10-CM | POA: Diagnosis not present

## 2021-11-25 DIAGNOSIS — E0821 Diabetes mellitus due to underlying condition with diabetic nephropathy: Secondary | ICD-10-CM | POA: Diagnosis not present

## 2021-11-25 DIAGNOSIS — N186 End stage renal disease: Secondary | ICD-10-CM | POA: Diagnosis not present

## 2021-11-27 DIAGNOSIS — Z794 Long term (current) use of insulin: Secondary | ICD-10-CM | POA: Diagnosis not present

## 2021-11-27 DIAGNOSIS — N186 End stage renal disease: Secondary | ICD-10-CM | POA: Diagnosis not present

## 2021-11-27 DIAGNOSIS — Z0000 Encounter for general adult medical examination without abnormal findings: Secondary | ICD-10-CM | POA: Diagnosis not present

## 2021-11-27 DIAGNOSIS — Z1331 Encounter for screening for depression: Secondary | ICD-10-CM | POA: Diagnosis not present

## 2021-11-27 DIAGNOSIS — E1122 Type 2 diabetes mellitus with diabetic chronic kidney disease: Secondary | ICD-10-CM | POA: Diagnosis not present

## 2021-11-27 DIAGNOSIS — E1151 Type 2 diabetes mellitus with diabetic peripheral angiopathy without gangrene: Secondary | ICD-10-CM | POA: Diagnosis not present

## 2021-11-28 DIAGNOSIS — N2581 Secondary hyperparathyroidism of renal origin: Secondary | ICD-10-CM | POA: Diagnosis not present

## 2021-11-28 DIAGNOSIS — D509 Iron deficiency anemia, unspecified: Secondary | ICD-10-CM | POA: Diagnosis not present

## 2021-11-28 DIAGNOSIS — Z992 Dependence on renal dialysis: Secondary | ICD-10-CM | POA: Diagnosis not present

## 2021-11-28 DIAGNOSIS — D631 Anemia in chronic kidney disease: Secondary | ICD-10-CM | POA: Diagnosis not present

## 2021-11-28 DIAGNOSIS — N186 End stage renal disease: Secondary | ICD-10-CM | POA: Diagnosis not present

## 2021-11-28 DIAGNOSIS — I38 Endocarditis, valve unspecified: Secondary | ICD-10-CM | POA: Diagnosis not present

## 2021-11-28 DIAGNOSIS — E0821 Diabetes mellitus due to underlying condition with diabetic nephropathy: Secondary | ICD-10-CM | POA: Diagnosis not present

## 2021-11-28 DIAGNOSIS — D689 Coagulation defect, unspecified: Secondary | ICD-10-CM | POA: Diagnosis not present

## 2021-11-30 DIAGNOSIS — N2581 Secondary hyperparathyroidism of renal origin: Secondary | ICD-10-CM | POA: Diagnosis not present

## 2021-11-30 DIAGNOSIS — D631 Anemia in chronic kidney disease: Secondary | ICD-10-CM | POA: Diagnosis not present

## 2021-11-30 DIAGNOSIS — D509 Iron deficiency anemia, unspecified: Secondary | ICD-10-CM | POA: Diagnosis not present

## 2021-11-30 DIAGNOSIS — I38 Endocarditis, valve unspecified: Secondary | ICD-10-CM | POA: Diagnosis not present

## 2021-11-30 DIAGNOSIS — D689 Coagulation defect, unspecified: Secondary | ICD-10-CM | POA: Diagnosis not present

## 2021-11-30 DIAGNOSIS — N186 End stage renal disease: Secondary | ICD-10-CM | POA: Diagnosis not present

## 2021-11-30 DIAGNOSIS — E0821 Diabetes mellitus due to underlying condition with diabetic nephropathy: Secondary | ICD-10-CM | POA: Diagnosis not present

## 2021-11-30 DIAGNOSIS — Z992 Dependence on renal dialysis: Secondary | ICD-10-CM | POA: Diagnosis not present

## 2021-11-30 NOTE — Progress Notes (Signed)
Alexander [name] Date of Birth: 1961-05-01 MRN: [id number] Primary Care Provider:Tisovec, [name], MD Primary Cardiologist: Alethia Berthold, PA-C,  Chief Complaint  Patient presents with   Atrial Fibrillation   Follow-up    HPI  Alexander [name] is a 61 y.o. Caucasian male patient with hypertension, hyperlipidemia, diabetes mellitus with end-stage renal disease on hemodialysis, coronary artery disease with atherectomy followed by stenting to LAD and to the distal circumflex in 2015. Coronary angiography in 2021, sick sinus syndrome and also complete heart block and paroxysmal atrial fibrillation SP permanent Medtronic pacemaker implantation in 2015 PAD SP right BKA and remote femoropopliteal bypass surgery, she also has history of alcohol excess use.  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 fib with RVR.  Patient was evaluated by Dr. [name] who uptitrated AV nodal blocking agents, patient discharged on metoprolol tartrate 25 mg p.o. twice daily and diltiazem CD1 120 mg daily.  Given high risk of worsening infection/embolization with pacemaker removal decision was to treat with antibiotics. Patient continues to follow closely with infectious disease, orthopedics, and vascular surgery for management of the same as well as chronic comorbidities.  Patient presents today for hospital follow-up with his daughter Alexander [name] present at bedside.  Patient states overall he is feeling improved since discharge. Denies chest pain, swelling, palpitations, orthopnea, PND. Denies melena, blood in his urine or stool.  His primary concerns today are whether he can discontinue Eliquis as well as requesting pacemaker transmission scheduled.  Notably patient's heart rate on EKG today is 112 bpm, however he admits he has not taken Lopressor or  diltiazem today.  He remains on dialysis with Tuesday, Thursday, Saturday schedule.  PAST MEDICAL HISTORY: Past Medical History:  Diagnosis Date   Acute blood loss anemia    Alcohol abuse    Anemia    Anemia of chronic disease    Arthritis    "patient does not think so."   Atrial fibrillation ([place])    Cardiac syncope 10/07/2014   rapid A fib with 8 sec pauses on converison with syncope- temp pacing wire placed then PPM   Cataract    BILATERAL   Coronary artery disease    Diabetes mellitus    dx---been  awhile   Elevated LFTs    Endocarditis of tricuspid valve    ESRD (end stage renal disease) ([place])    [place]   GERD (gastroesophageal reflux disease)    History of blood transfusion    Hyperlipemia    Hypertension    Malnutrition ([place])    Osteomyelitis ([place])    right foot   Peripheral vascular disease ([place])    Presence of permanent cardiac pacemaker 10/08/2014   Medtronic   Tricuspid valve vegetation     PAST SURGICAL HISTORY: Past Surgical History:  Procedure Laterality Date   A/V FISTULAGRAM Right 05/01/2021   Procedure: A/V FISTULAGRAM;  Surgeon: Waynetta Sandy, MD;  Location: [place] [facility];  Service: Cardiovascular;  Laterality: Right;  failed fistulagram   ABDOMINAL AORTOGRAM W/LOWER EXTREMITY N/A 09/09/2017   Procedure: ABDOMINAL AORTOGRAM W/LOWER EXTREMITY;  Surgeon: Angelia Mould, MD;  Location: [place] [facility];  Service: Cardiovascular;  Laterality: N/A;   ABDOMINAL AORTOGRAM W/LOWER EXTREMITY Right 11/11/2019   Procedure: ABDOMINAL AORTOGRAM W/LOWER EXTREMITY;  Surgeon: Marty Heck, MD;  Location: [place] [facility];  Service: Cardiovascular;  Laterality: Right;   AMPUTATION Left 08/19/2013   Procedure: AMPUTATION RAY;  Surgeon: Alta Corning, MD;  Location: [place];  Service: Orthopedics;  Laterality: Left;  ray amputation left 5th   AMPUTATION Left 10/27/2013   Procedure: AMPUTATION DIGIT-LEFT 4TH TOE,  4th and 5th metatarsal.;  Surgeon: Angelia Mould, MD;  Location: [place];  Service: Vascular;  Laterality: Left;   AMPUTATION Left 11/04/2013   Procedure: LEFT FOOT TRANS-METATARSAL AMPUTATION WITH WOUND CLOSURE ;  Surgeon: Alta Corning, MD;  Location: [place];  Service: Orthopedics;  Laterality: Left;   AMPUTATION Right 09/10/2017   Procedure: RIGHT FOURTH AND FIFTH TOE AMPUTATION;  Surgeon: Angelia Mould, MD;  Location: [place];  Service: Vascular;  Laterality: Right;   AMPUTATION Right 02/19/2020   Procedure: AMPUTATION RIGHT 3RD TOE;  Surgeon: Angelia Mould, MD;  Location: [place];  Service: Vascular;  Laterality: Right;   AMPUTATION Right 03/11/2020   Procedure: RIGHT BELOW KNEE AMPUTATION;  Surgeon: Newt Minion, MD;  Location: [place];  Service: Orthopedics;  Laterality: Right;   APPLICATION OF WOUND VAC Right 02/19/2020   Procedure: Application Of Wound Vac Right foot;  Surgeon: Angelia Mould, MD;  Location: [place];  Service: Vascular;  Laterality: Right;   AV FISTULA PLACEMENT Right 03/27/2021   Procedure: RIGHT ARM ARTERIOVENOUS (AV) FISTULA;  Surgeon: Angelia Mould, MD;  Location: [place];  Service: Vascular;  Laterality: Right;   AV FISTULA PLACEMENT Right 05/10/2021   Procedure: RIGHT ARM ARTERIOVENOUS (AV) FISTULA;  Surgeon: Waynetta Sandy, MD;  Location: [place];  Service: Vascular;  Laterality: Right;   [place] Right 08/11/2021   Procedure: RIGHT ARM SECOND STAGE BASILIC VEIN TRANSPOSITION;  Surgeon: Waynetta Sandy, MD;  Location: [place];  Service: Vascular;  Laterality: Right;   BIOPSY  07/20/2020   Procedure: BIOPSY;  Surgeon: Irving Copas., MD;  Location: [place];  Service: Gastroenterology;;   CARDIAC CATHETERIZATION  10/07/2014   Procedure: LEFT HEART CATH AND CORONARY ANGIOGRAPHY;  Surgeon: Laverda Page, MD;  Location: [facility] CATH LAB;  Service: Cardiovascular;;    COLONOSCOPY W/ POLYPECTOMY     COLONOSCOPY WITH PROPOFOL N/A 05/25/2020   Procedure: COLONOSCOPY WITH PROPOFOL;  Surgeon: Gatha Mayer, MD;  Location: [facility];  Service: Endoscopy;  Laterality: N/A;   EMBOLECTOMY Right 09/12/2017   Procedure: Thrombectomy  & Redo Right Below Knee Popliteal Artery Bypass Graft  ;  Surgeon: Waynetta Sandy, MD;  Location: [facility] OR;  Service: Vascular;  Laterality: Right;   ENTEROSCOPY N/A 07/20/2020   Procedure: ENTEROSCOPY;  Surgeon: Rush Landmark Telford Nab., MD;  Location: [place];  Service: Gastroenterology;  Laterality: N/A;   ESOPHAGOGASTRODUODENOSCOPY (EGD) WITH PROPOFOL N/A 05/24/2020   Procedure: ESOPHAGOGASTRODUODENOSCOPY (EGD) WITH PROPOFOL;  Surgeon: Ladene Artist, MD;  Location: [facility];  Service: Endoscopy;  Laterality: N/A;   EYE SURGERY Bilateral    cataract   FEMORAL-TIBIAL BYPASS GRAFT Left 10/27/2013   Procedure: BYPASS GRAFT LEFT FEMORAL- POSTERIOR TIBIAL ARTERY;  Surgeon: Angelia Mould, MD;  Location: [place];  Service: Vascular;  Laterality: Left;   FEMORAL-TIBIAL BYPASS GRAFT Right 09/10/2017   Procedure: RIGHT SUPERFICIAL  FEMORAL ARTERY-BELOW KNEE POPLITEAL ARTERY BYPASS GRAFT WITH VEIN;  Surgeon: Angelia Mould, MD;  Location: [place];  Service: Vascular;  Laterality: Right;   GIVENS CAPSULE STUDY N/A 07/20/2020   Procedure: GIVENS CAPSULE STUDY;  Surgeon: Irving Copas., MD;  Location:  [place] [facility];  Service: Gastroenterology;  Laterality: N/A;  capsule placed through scope into duodenum @ 0936   I & D EXTREMITY Left 10/27/2013   Procedure: IRRIGATION AND DEBRIDEMENT EXTREMITY- LEFT FOOT;  Surgeon: Angelia Mould, MD;  Location: [place];  Service: Vascular;  Laterality: Left;   I & D EXTREMITY Right 01/26/2020   Procedure: IRRIGATION AND DEBRIDEMENT EXTREMITY RIGHT FOOT WOUND;  Surgeon: Angelia Mould, MD;  Location: [place];  Service: Vascular;  Laterality: Right;   INSERT /  REPLACE / REMOVE PACEMAKER     INSERTION OF DIALYSIS CATHETER Right 11/03/2021   Procedure: RIGHT INTERNAL JUGULAR INSERTION OF TUNNELED DIALYSIS CATHETER;  Surgeon: Angelia Mould, MD;  Location: [place];  Service: Vascular;  Laterality: Right;   INTRAOPERATIVE ARTERIOGRAM Right 09/10/2017   Procedure: INTRA OPERATIVE ARTERIOGRAM;  Surgeon: Angelia Mould, MD;  Location: [place];  Service: Vascular;  Laterality: Right;   IR ANGIOGRAM FOLLOW UP STUDY  09/09/2017   IR FLUORO GUIDE CV LINE RIGHT  05/31/2020   IR US GUIDE VASC ACCESS RIGHT  06/01/2020   LEFT HEART CATH AND CORONARY ANGIOGRAPHY N/A 08/19/2020   Procedure: LEFT HEART CATH AND CORONARY ANGIOGRAPHY;  Surgeon: Adrian Prows, MD;  Location: [place] [facility];  Service: Cardiovascular;  Laterality: N/A;   LEFT HEART CATHETERIZATION WITH CORONARY ANGIOGRAM N/A 11/09/2014   Procedure: LEFT HEART CATHETERIZATION WITH CORONARY ANGIOGRAM;  Surgeon: Laverda Page, MD;  Location: [facility] CATH LAB;  Service: Cardiovascular;  Laterality: N/A;   LOWER EXTREMITY ANGIOGRAM Right 11/08/2015   Procedure: Lower Extremity Angiogram;  Surgeon: Serafina Mitchell, MD;  Location: [place] [facility];  Service: Cardiovascular;  Laterality: Right;   LUMBAR LAMINECTOMY  06/13/2012   Procedure: MICRODISCECTOMY LUMBAR LAMINECTOMY;  Surgeon: Jessy Oto, MD;  Location: [place];  Service: Orthopedics;  Laterality: N/A;  Central laminectomy L2-3, L3-4, L4-5   PACEMAKER INSERTION  10/08/2014   MDT Advisa MRI compatible dual chamber pacemaker implanted by Dr Caryl Comes for syncope with post-termination pauses   PERCUTANEOUS CORONARY STENT INTERVENTION (PCI-S)  11/09/2014   des to lad & distal circumflex         Dr  [name]   PERIPHERAL VASCULAR BALLOON ANGIOPLASTY Right 11/11/2019   Procedure: PERIPHERAL VASCULAR BALLOON ANGIOPLASTY;  Surgeon: Marty Heck, MD;  Location: [place] [facility];  Service: Cardiovascular;  Laterality: Right;  below knee popliteal,  tibioperoneal trunk, posterior tibial arteries   PERIPHERAL VASCULAR CATHETERIZATION N/A 11/08/2015   Procedure: Abdominal Aortogram;  Surgeon: Serafina Mitchell, MD;  Location: [place] [facility];  Service: Cardiovascular;  Laterality: N/A;   PERIPHERAL VASCULAR CATHETERIZATION Right 11/08/2015   Procedure: Peripheral Vascular Atherectomy;  Surgeon: Serafina Mitchell, MD;  Location: [place] [facility];  Service: Cardiovascular;  Laterality: Right;   PERIPHERAL VASCULAR CATHETERIZATION N/A 10/10/2016   Procedure: Lower Extremity Angiography;  Surgeon: Waynetta Sandy, MD;  Location: [place] [facility];  Service: Cardiovascular;  Laterality: N/A;   PERIPHERAL VASCULAR CATHETERIZATION Right 10/10/2016   Procedure: Peripheral Vascular Atherectomy;  Surgeon: Waynetta Sandy, MD;  Location: [place] [facility];  Service: Cardiovascular;  Laterality: Right;  Popliteal   PERMANENT PACEMAKER INSERTION N/A 10/08/2014   Procedure: PERMANENT PACEMAKER INSERTION;  Surgeon: Deboraha Sprang, MD;  Location: [facility] CATH LAB;  Service: Cardiovascular;  Laterality: N/A;   SUBMUCOSAL TATTOO INJECTION  07/20/2020   Procedure: SUBMUCOSAL TATTOO INJECTION;  Surgeon: Irving Copas., MD;  Location: [place];  Service: Gastroenterology;;  TEE WITHOUT CARDIOVERSION N/A 10/27/2021   Procedure: TRANSESOPHAGEAL ECHOCARDIOGRAM (TEE);  Surgeon: Adrian Prows, MD;  Location: [facility] ENDOSCOPY;  Service: Cardiovascular;  Laterality: N/A;   TEMPORARY PACEMAKER INSERTION Bilateral 10/07/2014   Procedure: TEMPORARY PACEMAKER INSERTION;  Surgeon: Laverda Page, MD;  Location: [facility] CATH LAB;  Service: Cardiovascular;  Laterality: Bilateral;   WOUND DEBRIDEMENT Right 02/19/2020   Procedure: DEBRIDEMENT WOUND RIGHT FOOT;  Surgeon: Angelia Mould, MD;  Location: [facility] OR;  Service: Vascular;  Laterality: Right;    FAMILY HISTORY: The patient's family history includes Breast cancer in his sister; Diabetes in his  brother, mother, and sister; Diabetes type II in his brother, mother, sister, and sister; Hypertension in his brother, mother, and sister; Kidney failure in his brother; Liver cancer in his father.   SOCIAL HISTORY:  Social History   Tobacco Use   Smoking status: Former    Packs/day: 1.50    Years: 30.00    Pack years: 45.00    Types: Cigarettes    Quit date: 10/07/2014    Years since quitting: 7.1   Smokeless tobacco: Never  Substance Use Topics   Alcohol use: Yes    Alcohol/week: 21.0 standard drinks    Types: 21 Glasses of wine per week    Comment: cut way back, none in the last month     Review of Systems  Cardiovascular:  Negative for chest pain, dyspnea on exertion and leg swelling.  Gastrointestinal:  Negative for melena.   PHYSICAL EXAM: Vitals with BMI 12/01/2021 11/22/2021 11/08/2021  Height '6\' 0"'  - -  Weight 237 lbs - -  BMI 29.51 - -  Systolic 884 166 063  Diastolic 69 77 85  Pulse 84 84 -   Physical Exam Vitals reviewed.  Constitutional:      Appearance: He is obese.     Comments: Appears older than stated age  Neck:     Vascular: No carotid bruit or JVD.  Cardiovascular:     Rate and Rhythm: Tachycardia present. Rhythm irregular.     Pulses:          Carotid pulses are 2+ on the right side and 2+ on the left side.      Radial pulses are 2+ on the right side and 2+ on the left side.       Femoral pulses are 1+ on the right side and 1+ on the left side.      Popliteal pulses are 1+ on the left side. Right popliteal pulse not accessible.        Right dorsalis pedis pulse not accessible and left dorsalis pedis pulse not accessible.       Posterior tibial pulses are 0 on the left side. Right posterior tibial pulse not accessible.     Heart sounds: S1 normal and S2 normal. No murmur heard.   No gallop.     Comments: HD cath c/d/i Pulmonary:     Effort: Pulmonary effort is normal. No respiratory distress.     Breath sounds: No wheezing, rhonchi or rales.   Chest:     Comments: Pacemaker in the left infraclavicular region site is clean dry and intact. Musculoskeletal:     Comments: Left TMA c/d/I, right BKA stump  Skin:    General: Skin is warm and dry.   LABORATORY DATA: CBC Latest Ref Rng & Units 11/06/2021 11/05/2021 11/04/2021  WBC 4.0 - 10.5 K/uL - 11.1(H) 10.8(H)  Hemoglobin 13.0 - 17.0 g/dL 9.1(L) 8.7(L) 7.5(L)  Hematocrit  39.0 - 52.0 % 30.2(L) 28.9(L) 24.8(L)  Platelets 150 - 400 K/uL - 93(L) 90(L)    CMP Latest Ref Rng & Units 11/06/2021 11/05/2021 11/04/2021  Glucose 70 - 99 mg/dL 192(H) 218(H) 240(H)  BUN 8 - 23 mg/dL 35(H) 22 28(H)  Creatinine 0.61 - 1.24 mg/dL 4.11(H) 2.91(H) 3.37(H)  Sodium 135 - 145 mmol/L 130(L) 133(L) 132(L)  Potassium 3.5 - 5.1 mmol/L 4.1 3.6 4.0  Chloride 98 - 111 mmol/L 94(L) 98 97(L)  CO2 22 - 32 mmol/L '24 25 26  ' Calcium 8.9 - 10.3 mg/dL 7.7(L) 8.0(L) 7.5(L)  Total Protein 6.5 - 8.1 g/dL - - -  Total Bilirubin 0.3 - 1.2 mg/dL - - -  Alkaline Phos 38 - 126 U/L - - -  AST 15 - 41 U/L - - -  ALT 0 - 44 U/L - - -    Lab Results  Component Value Date   HGBA1C 6.8 (H) 10/29/2021   HGBA1C 5.5 05/23/2020   HGBA1C 8.6 (H) 01/27/2020   External Labs:   Cholesterol, total 114.000 m 10/17/2020 HDL 39.000 mg 10/17/2020 LDL 32.000 mg 10/17/2020 Triglycerides 214.000 m 10/17/2020  A1C 8.800 01/30/2021  10/06/2019: A1c 6.5%.  Serum glucose 136 mg, BUN 55, creatinine 2.6, eGFR 25/30 ML.  Potassium 5.4.  CMP otherwise normal.  HB 10.2/HCT 31.4, platelets 141.  Normal indicis.  Total cholesterol 95, triglycerides 98, HDL 34, LDL 41.  Non-HDL cholesterol 61.  PSA normal.  11/17/2020: BUN 32, creatinine 4.44, potassium 3.9, sodium 130  12/01/2020: Platelets 151, hemoglobin 9.6, hematocrit 28.4  Allergies   Allergies  Allergen Reactions   Cytoxan [Cyclophosphamide] Other (See Comments)    Pancytopenia   Keflex [Cephalexin] Nausea And Vomiting   Tylenol [Acetaminophen] Other (See Comments)    Makes  the patient sweat   Pletal [Cilostazol] Palpitations and Other (See Comments)    "Can hear heart beating loudly".    Medications Prior to Visit:   Outpatient Medications Prior to Visit  Medication Sig Dispense Refill   ACCU-CHEK FASTCLIX LANCETS MISC Check blood sugar TID & QHS 102 each 2   amoxicillin (AMOXIL) 500 MG capsule Take 1 capsule (500 mg total) by mouth at bedtime. 30 capsule 11   atorvastatin (LIPITOR) 20 MG tablet Take 1 tablet (20 mg total) by mouth at bedtime.     blood glucose meter kit and supplies KIT Check blood sugar TID & QHS 1 each 0   Blood Glucose Monitoring Suppl (ACCU-CHEK ADVANTAGE DIABETES) kit Use as instructed 1 each 0   ceFEPIme 2 g in sodium chloride 0.9 % 100 mL Inject 2 g into the vein Every Tuesday,Thursday,and Saturday with dialysis.     diltiazem (CARDIZEM CD) 120 MG 24 hr capsule Take 1 capsule (120 mg total) by mouth daily. 30 capsule 0   doxycycline (VIBRA-TABS) 100 MG tablet Take 1 tablet (100 mg total) by mouth 2 (two) times daily. 60 tablet 11   fluticasone (FLONASE) 50 MCG/ACT nasal spray Place 2 sprays into both nostrils daily.     furosemide (LASIX) 80 MG tablet Take 80 mg by mouth See admin instructions. Take 80 mg by mouth on (non-dialysis days) Mondays, Wednesdays, Fridays & Sundays in the morning as needed "to cause urination"     glucose blood (ACCU-CHEK AVIVA PLUS) test strip Use as instructed 100 each 12   glucose blood (CHOICE DM FORA G20 TEST STRIPS) test strip Check blood sugar TID & QHS 100 each 12   glucose blood test strip Use  as instructed 100 each 12   lidocaine-prilocaine (EMLA) cream Apply 1 application topically every dialysis (on Tues/Thurs/Sat).     metoprolol tartrate (LOPRESSOR) 25 MG tablet Take 1 tablet (25 mg total) by mouth 2 (two) times daily. 180 tablet 3   Multiple Vitamin (MULTIVITAMIN WITH MINERALS) TABS tablet Take 1 tablet by mouth 2 (two) times a week.     NOVOLIN 70/30 (70-30) 100 UNIT/ML injection Inject 16  Units into the skin 2 (two) times daily with a meal.     oxyCODONE-acetaminophen (PERCOCET/ROXICET) 5-325 MG tablet Take 1 tablet by mouth every 8 (eight) hours as needed for moderate pain or severe pain. 15 tablet 0   pantoprazole (PROTONIX) 40 MG tablet Take 40 mg by mouth daily.     vancomycin (VANCOCIN) 1-5 GM/200ML-% SOLN Inject 200 mLs (1,000 mg total) into the vein Every Tuesday,Thursday,and Saturday with dialysis.     apixaban (ELIQUIS) 5 MG TABS tablet Take 1 tablet (5 mg total) by mouth 2 (two) times daily. (Patient taking differently: Take 5 mg by mouth in the morning.) 180 tablet 0   No facility-administered medications prior to visit.   Final Medications at End of Visit    Current Meds  Medication Sig   ACCU-CHEK FASTCLIX LANCETS MISC Check blood sugar TID & QHS   amoxicillin (AMOXIL) 500 MG capsule Take 1 capsule (500 mg total) by mouth at bedtime.   atorvastatin (LIPITOR) 20 MG tablet Take 1 tablet (20 mg total) by mouth at bedtime.   blood glucose meter kit and supplies KIT Check blood sugar TID & QHS   Blood Glucose Monitoring Suppl (ACCU-CHEK ADVANTAGE DIABETES) kit Use as instructed   ceFEPIme 2 g in sodium chloride 0.9 % 100 mL Inject 2 g into the vein Every Tuesday,Thursday,and Saturday with dialysis.   diltiazem (CARDIZEM CD) 120 MG 24 hr capsule Take 1 capsule (120 mg total) by mouth daily.   doxycycline (VIBRA-TABS) 100 MG tablet Take 1 tablet (100 mg total) by mouth 2 (two) times daily.   fluticasone (FLONASE) 50 MCG/ACT nasal spray Place 2 sprays into both nostrils daily.   furosemide (LASIX) 80 MG tablet Take 80 mg by mouth See admin instructions. Take 80 mg by mouth on (non-dialysis days) Mondays, Wednesdays, Fridays & Sundays in the morning as needed "to cause urination"   glucose blood (ACCU-CHEK AVIVA PLUS) test strip Use as instructed   glucose blood (CHOICE DM FORA G20 TEST STRIPS) test strip Check blood sugar TID & QHS   glucose blood test strip Use as  instructed   lidocaine-prilocaine (EMLA) cream Apply 1 application topically every dialysis (on Tues/Thurs/Sat).   metoprolol tartrate (LOPRESSOR) 25 MG tablet Take 1 tablet (25 mg total) by mouth 2 (two) times daily.   Multiple Vitamin (MULTIVITAMIN WITH MINERALS) TABS tablet Take 1 tablet by mouth 2 (two) times a week.   NOVOLIN 70/30 (70-30) 100 UNIT/ML injection Inject 16 Units into the skin 2 (two) times daily with a meal.   oxyCODONE-acetaminophen (PERCOCET/ROXICET) 5-325 MG tablet Take 1 tablet by mouth every 8 (eight) hours as needed for moderate pain or severe pain.   pantoprazole (PROTONIX) 40 MG tablet Take 40 mg by mouth daily.   vancomycin (VANCOCIN) 1-5 GM/200ML-% SOLN Inject 200 mLs (1,000 mg total) into the vein Every Tuesday,Thursday,and Saturday with dialysis.   [DISCONTINUED] apixaban (ELIQUIS) 5 MG TABS tablet Take 1 tablet (5 mg total) by mouth 2 (two) times daily. (Patient taking differently: Take 5 mg by mouth in the morning.)  Cardiac Database:  Lexiscan myoview stress test 01/27/2016: 1. The resting electrocardiogram demonstrated normal sinus rhythm, normal resting conduction, no resting arrhythmias and normal rest repolarization. Stress EKG is non-diagnostic for ischemia as it a pharmacologic stress using Lexiscan. Stress symptoms included dyspnea. 2. The perfusion imaging study demonstrates a moderate-sized defect in the inferior wall extending from the base towards the apex, the defect slightly improved in stress images, suggests soft tissue attenuation. However scar in this region could not excluded. Left ventricular systolic function Calculated by QGS was 72%. This is a low risk study. Clinical correlation is recommended.  Echocardiogram 05/24/2020: LVEF 60-65%, no regional wall motion of normalities, severe left ventricular hypertrophy, grade 2 diastolic impairment, mildly elevated PASP, moderately dilated biatrial cavities, mild MR.   Left Heart Catheterization  08/19/20:  Prior coronary angiogram: 11/09/2014: Diamondback CSI coronary atherectomy and stenting of the mid LAD-3.0 x 28 mm Promus Premier DES following. Distal dominant circumflex coronary artery following diamondback atherectomy and implantation of a 2.5 x 16 mm Promus Premier DES. Previously placed stents in LAD and CX patent. Mild to moderate diffuse disease in LAD, D1 and D2 and distal dominant Cx. RCA non dominant and severely diseased and small.  TEE 10/27/2021:  1. Left ventricular ejection fraction, by estimation, is 40 to 45%. The  left ventricle has mildly decreased function. The left ventricle  demonstrates global hypokinesis. There is moderate concentric left  ventricular hypertrophy.   2. Right ventricular systolic function is normal. The right ventricular  size is normal.   3. Left atrial size was moderately dilated. No left atrial/left atrial  appendage thrombus was detected. The LAA emptying velocity was 30 cm/s.   4. Right atrial size was mildly dilated. There is a 1x1cm mass attached  to the RA pacemaker lead suggestive of thrombus or a vegetation.   5. The mitral valve is normal in structure. Mild to moderate mitral valve  regurgitation. No evidence of mitral stenosis.   6. There is a 2cmx1cm mass attached to the RV lead of the pacemaker  suggestive of a vegetation or a thrombus. The Anterior and septal TV  leaflets are thickened and suggests involvement with the vegetation. The  septal leaflet is flail. The presence of  flail leaflet suggests vegetation. . The tricuspid valve is degenerative.  Tricuspid valve regurgitation is severe.   7. The aortic valve is normal in structure. Aortic valve regurgitation is  not visualized. No aortic stenosis is present.   8. There is mild (Grade II) plaque involving the ascending aorta, aortic  arch and descending aorta.   Pacemaker   Pacemaker in situ: Medtronic advica DR (MRI compatible) dual-chamber pacemaker  10/07/2014  Scheduled  In office pacemaker check 05/24/21:  Single (S)/Dual (D)/BV: Dual. Presenting ASVS. Pacemaker dependant:  No. Underlying A. Fib. AP 14%, VP 3%. AMS Episodes 320.  AT/AF burden 44.6% . Longest 36 hours. Latest 02/13/2021. Today 6 hours and ongoing. HVR 1 = False. Longevity 3 Years. Magnet rate: >85%. Lead measurements: Stable. Histogram: Low (L)/normal (N)/high (H)  Normal. Patient activity Low.   Observations: Normal pacemaker function. Changes: None.  EKG   12/07/2018: Sinus rhythm at a rate of 70 bpm.  Left axis, left anterior fascicular block.  Complete right bundle branch block.  Compared to EKG 04/22/2020, poor R wave progression not present.  04/22/2020: Normal sinus rhythm, 72 bpm, left axis deviation, left anterior fascicular block, right bundle branch block, poor R wave progression.  No significant change compared to prior ECG  dated 10/23/2019.  12/01/2021: Atrial fibrillation at a rate of 112 bpm.  Right bundle branch block, left anterior fascicular block.  Assessment and plan:      ICD-10-CM   1. Paroxysmal atrial fibrillation (HCC)  I48.0 EKG 12-Lead    2. PAD (peripheral artery disease) (HCC)  I73.9     3. Atherosclerosis of native coronary artery of native heart without angina pectoris  I25.10     4. Sinus node dysfunction (HCC)  I49.5     5. Pacemaker: Medtronic Advisa DR MRI [id number] Dual chamber pacemaker 10/08/2014  Z95.0     6. ESRD (end stage renal disease) ([place])  N18.6       This patients CHA2DS2-VASc Score 3 (HTN, DM, vasc) and yearly risk of stroke 3.2%.   No orders of the defined types were placed in this encounter.    RECOMMENDATIONS:  Alexander [name] is a 61 y.o. Caucasian male patient with hypertension, hyperlipidemia, diabetes mellitus with end-stage renal disease on hemodialysis, coronary artery disease with atherectomy followed by stenting to LAD and to the distal circumflex in 2015. Coronary angiography in 2021, sick sinus  syndrome and also complete heart block and paroxysmal atrial fibrillation SP permanent Medtronic pacemaker implantation in 2015 PAD SP right BKA and remote femoropopliteal bypass surgery, she also has history of alcohol excess use.  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 fib with RVR.  Patient was evaluated by Dr. [name] who uptitrated AV nodal blocking agents, patient discharged on metoprolol tartrate 25 mg p.o. twice daily and diltiazem CD1 120 mg daily.  Given high risk of worsening infection/embolization with pacemaker removal decision was to treat with antibiotics. Patient continues to follow closely with infectious disease, orthopedics, and vascular surgery for management of the same as well as chronic comorbidities.  Patient presents today for hospital follow-up with his daughter Alexander [name] present at bedside. Patient is feeling well overall. Today provided him with schedule for pacemaker transmissions. Also discussed indication, risk, benefits of anticoagulation. Patient verbalized understanding and wishes to continue Eliquis as he is tolerating this without bleeding diathesis.   Patient is in atrial fibrillation today at rate of 112 bpm, however he has not taken Lopressor or diltiazem today. Patient has not had issues with hypotension since discharge.   Discussed case with Dr. [name] who recommends continuing present medical management. Also discussed that given patient's chronic comorbidities and cardiac history management options are limited, but medical therapy is presently optimized.   Will not make changes at today's office visit. Follow up in 3 months.   Patient was seen in collaboration with Dr. [name] and he is in agreement with the plan.     This was a 50-minute encounter with face-to-face counseling, medical  records review, coordination of care, explanation of complex medical issues, complex medical decision making.     Alethia Berthold, PA-C 12/01/2021, 11:19 AM Office: [phone]   CC: Harrie Jeans, MD (Nephrology); Domenick Gong, MD (PCP)

## 2021-12-01 ENCOUNTER — Encounter: Payer: Self-pay | Admitting: Student

## 2021-12-01 ENCOUNTER — Ambulatory Visit: Payer: Medicare Other | Admitting: Student

## 2021-12-01 ENCOUNTER — Other Ambulatory Visit: Payer: Self-pay

## 2021-12-01 VITALS — BP 127/69 | HR 84 | Ht 72.0 in | Wt 237.0 lb

## 2021-12-01 DIAGNOSIS — I2510 Atherosclerotic heart disease of native coronary artery without angina pectoris: Secondary | ICD-10-CM

## 2021-12-01 DIAGNOSIS — Z950 Presence of cardiac pacemaker: Secondary | ICD-10-CM

## 2021-12-01 DIAGNOSIS — I495 Sick sinus syndrome: Secondary | ICD-10-CM

## 2021-12-01 DIAGNOSIS — I480 Paroxysmal atrial fibrillation: Secondary | ICD-10-CM

## 2021-12-01 DIAGNOSIS — I739 Peripheral vascular disease, unspecified: Secondary | ICD-10-CM

## 2021-12-01 DIAGNOSIS — N186 End stage renal disease: Secondary | ICD-10-CM

## 2021-12-01 DIAGNOSIS — Z7901 Long term (current) use of anticoagulants: Secondary | ICD-10-CM

## 2021-12-01 MED ORDER — APIXABAN 5 MG PO TABS: 5.0000 mg | ORAL_TABLET | Freq: Two times a day (BID) | ORAL | 3 refills | Status: DC

## 2021-12-02 DIAGNOSIS — D689 Coagulation defect, unspecified: Secondary | ICD-10-CM | POA: Diagnosis not present

## 2021-12-02 DIAGNOSIS — D509 Iron deficiency anemia, unspecified: Secondary | ICD-10-CM | POA: Diagnosis not present

## 2021-12-02 DIAGNOSIS — Z992 Dependence on renal dialysis: Secondary | ICD-10-CM | POA: Diagnosis not present

## 2021-12-02 DIAGNOSIS — N2581 Secondary hyperparathyroidism of renal origin: Secondary | ICD-10-CM | POA: Diagnosis not present

## 2021-12-02 DIAGNOSIS — I38 Endocarditis, valve unspecified: Secondary | ICD-10-CM | POA: Diagnosis not present

## 2021-12-02 DIAGNOSIS — E0821 Diabetes mellitus due to underlying condition with diabetic nephropathy: Secondary | ICD-10-CM | POA: Diagnosis not present

## 2021-12-02 DIAGNOSIS — N186 End stage renal disease: Secondary | ICD-10-CM | POA: Diagnosis not present

## 2021-12-02 DIAGNOSIS — D631 Anemia in chronic kidney disease: Secondary | ICD-10-CM | POA: Diagnosis not present

## 2021-12-04 ENCOUNTER — Other Ambulatory Visit: Payer: Self-pay

## 2021-12-04 DIAGNOSIS — N186 End stage renal disease: Secondary | ICD-10-CM

## 2021-12-05 ENCOUNTER — Other Ambulatory Visit: Payer: Self-pay | Admitting: Family

## 2021-12-05 DIAGNOSIS — D649 Anemia, unspecified: Secondary | ICD-10-CM

## 2021-12-05 DIAGNOSIS — D689 Coagulation defect, unspecified: Secondary | ICD-10-CM | POA: Diagnosis not present

## 2021-12-05 DIAGNOSIS — E0821 Diabetes mellitus due to underlying condition with diabetic nephropathy: Secondary | ICD-10-CM | POA: Diagnosis not present

## 2021-12-05 DIAGNOSIS — N186 End stage renal disease: Secondary | ICD-10-CM | POA: Diagnosis not present

## 2021-12-05 DIAGNOSIS — D509 Iron deficiency anemia, unspecified: Secondary | ICD-10-CM | POA: Diagnosis not present

## 2021-12-05 DIAGNOSIS — N2581 Secondary hyperparathyroidism of renal origin: Secondary | ICD-10-CM | POA: Diagnosis not present

## 2021-12-05 DIAGNOSIS — Z992 Dependence on renal dialysis: Secondary | ICD-10-CM | POA: Diagnosis not present

## 2021-12-05 DIAGNOSIS — D631 Anemia in chronic kidney disease: Secondary | ICD-10-CM | POA: Diagnosis not present

## 2021-12-05 DIAGNOSIS — D61818 Other pancytopenia: Secondary | ICD-10-CM

## 2021-12-05 DIAGNOSIS — I38 Endocarditis, valve unspecified: Secondary | ICD-10-CM | POA: Diagnosis not present

## 2021-12-06 ENCOUNTER — Encounter: Payer: Self-pay | Admitting: Family

## 2021-12-06 ENCOUNTER — Other Ambulatory Visit: Payer: Self-pay

## 2021-12-06 ENCOUNTER — Inpatient Hospital Stay: Payer: Medicare Other | Attending: Family

## 2021-12-06 ENCOUNTER — Inpatient Hospital Stay (HOSPITAL_BASED_OUTPATIENT_CLINIC_OR_DEPARTMENT_OTHER): Payer: Medicare Other | Admitting: Family

## 2021-12-06 ENCOUNTER — Telehealth: Payer: Self-pay | Admitting: *Deleted

## 2021-12-06 VITALS — BP 117/67 | HR 92 | Temp 98.2°F | Resp 19 | Ht 72.0 in | Wt 237.0 lb

## 2021-12-06 DIAGNOSIS — Z7901 Long term (current) use of anticoagulants: Secondary | ICD-10-CM | POA: Insufficient documentation

## 2021-12-06 DIAGNOSIS — Z794 Long term (current) use of insulin: Secondary | ICD-10-CM | POA: Insufficient documentation

## 2021-12-06 DIAGNOSIS — D649 Anemia, unspecified: Secondary | ICD-10-CM

## 2021-12-06 DIAGNOSIS — N186 End stage renal disease: Secondary | ICD-10-CM | POA: Insufficient documentation

## 2021-12-06 DIAGNOSIS — Z79899 Other long term (current) drug therapy: Secondary | ICD-10-CM | POA: Diagnosis not present

## 2021-12-06 DIAGNOSIS — Z992 Dependence on renal dialysis: Secondary | ICD-10-CM | POA: Diagnosis not present

## 2021-12-06 DIAGNOSIS — D61818 Other pancytopenia: Secondary | ICD-10-CM | POA: Insufficient documentation

## 2021-12-06 DIAGNOSIS — I120 Hypertensive chronic kidney disease with stage 5 chronic kidney disease or end stage renal disease: Secondary | ICD-10-CM | POA: Diagnosis not present

## 2021-12-06 DIAGNOSIS — Z8719 Personal history of other diseases of the digestive system: Secondary | ICD-10-CM | POA: Insufficient documentation

## 2021-12-06 DIAGNOSIS — E1151 Type 2 diabetes mellitus with diabetic peripheral angiopathy without gangrene: Secondary | ICD-10-CM | POA: Diagnosis not present

## 2021-12-06 LAB — LACTATE DEHYDROGENASE: LDH: 260 U/L — ABNORMAL HIGH (ref 98–192)

## 2021-12-06 LAB — CBC WITH DIFFERENTIAL (CANCER CENTER ONLY)
Abs Immature Granulocytes: 0.11 10*3/uL — ABNORMAL HIGH (ref 0.00–0.07)
Basophils Absolute: 0.1 10*3/uL (ref 0.0–0.1)
Basophils Relative: 1 %
Eosinophils Absolute: 0.1 10*3/uL (ref 0.0–0.5)
Eosinophils Relative: 1 %
HCT: 25.8 % — ABNORMAL LOW (ref 39.0–52.0)
Hemoglobin: 7.6 g/dL — ABNORMAL LOW (ref 13.0–17.0)
Immature Granulocytes: 1 %
Lymphocytes Relative: 25 %
Lymphs Abs: 2.5 10*3/uL (ref 0.7–4.0)
MCH: 29.6 pg (ref 26.0–34.0)
MCHC: 29.5 g/dL — ABNORMAL LOW (ref 30.0–36.0)
MCV: 100.4 fL — ABNORMAL HIGH (ref 80.0–100.0)
Monocytes Absolute: 0.6 10*3/uL (ref 0.1–1.0)
Monocytes Relative: 6 %
Neutro Abs: 6.5 10*3/uL (ref 1.7–7.7)
Neutrophils Relative %: 66 %
Platelet Count: 120 10*3/uL — ABNORMAL LOW (ref 150–400)
RBC: 2.57 MIL/uL — ABNORMAL LOW (ref 4.22–5.81)
RDW: 22.3 % — ABNORMAL HIGH (ref 11.5–15.5)
WBC Count: 9.9 10*3/uL (ref 4.0–10.5)
nRBC: 0.3 % — ABNORMAL HIGH (ref 0.0–0.2)

## 2021-12-06 LAB — CMP (CANCER CENTER ONLY)
ALT: 13 U/L (ref 0–44)
AST: 11 U/L — ABNORMAL LOW (ref 15–41)
Albumin: 3 g/dL — ABNORMAL LOW (ref 3.5–5.0)
Alkaline Phosphatase: 84 U/L (ref 38–126)
Anion gap: 9 (ref 5–15)
BUN: 18 mg/dL (ref 8–23)
CO2: 31 mmol/L (ref 22–32)
Calcium: 9 mg/dL (ref 8.9–10.3)
Chloride: 98 mmol/L (ref 98–111)
Creatinine: 3.02 mg/dL (ref 0.61–1.24)
GFR, Estimated: 23 mL/min — ABNORMAL LOW (ref >60)
Glucose, Bld: 158 mg/dL — ABNORMAL HIGH (ref 70–99)
Potassium: 4 mmol/L (ref 3.5–5.1)
Sodium: 138 mmol/L (ref 135–145)
Total Bilirubin: 1 mg/dL (ref 0.3–1.2)
Total Protein: 6.3 g/dL — ABNORMAL LOW (ref 6.5–8.1)

## 2021-12-06 LAB — SAMPLE TO BLOOD BANK

## 2021-12-06 LAB — SAVE SMEAR(SSMR), FOR PROVIDER SLIDE REVIEW

## 2021-12-06 LAB — IRON AND IRON BINDING CAPACITY (CC-WL,HP ONLY)
Iron: 29 ug/dL — ABNORMAL LOW (ref 45–182)
Saturation Ratios: 29 % (ref 17.9–39.5)
TIBC: 99 ug/dL — ABNORMAL LOW (ref 250–450)
UIBC: 70 ug/dL — ABNORMAL LOW (ref 117–376)

## 2021-12-06 LAB — RETICULOCYTES
Immature Retic Fract: 33.2 % — ABNORMAL HIGH (ref 2.3–15.9)
RBC.: 2.56 MIL/uL — ABNORMAL LOW (ref 4.22–5.81)
Retic Count, Absolute: 97 10*3/uL (ref 19.0–186.0)
Retic Ct Pct: 3.8 % — ABNORMAL HIGH (ref 0.4–3.1)

## 2021-12-06 NOTE — Progress Notes (Signed)
Hematology/Oncology Consultation   Name: Alexander [name]      MRN: [id number]    Location: Room/bed info not found  Date: 12/06/2021 Time:9:15 AM   REFERRING PHYSICIAN:  Anice Paganini, PA-C  REASON FOR CONSULT: Pancytopenia     DIAGNOSIS: Pancytopenia   HISTORY OF PRESENT ILLNESS: Alexander [name] is a very pleasant 61 yo caucasian gentleman with recent pancytopenia and some fatigue.  Hgb is 7.6, MCV 100, platelets 120 and WBC count 9.9.  He is on Eliquis for atrial fib.  He has not noted any obvious blood loss. He does bruise easily on Eliquis.  No petechiae.  He has remote history of colonic polyps. His colonoscopy in July 2021 was negative. EGD showed duodenitis in July 2021. Capsule endoscopy in September 2021 showed gastro duodenitis.  He is ESRD on hemodialysis T,Th, Sat. He received Mircera 225 mcg SQ once every 2 weeks with dialysis which is the max dose.  Cardiac history significant for CAD with arthrectomy and stent to LAD and distal circumflex (2015), sick sinus syndrome with complete heart block and paroxysmal atrial fib s/p permanent pacemaker implantation (2015).   He was hospitalized in December 2022 with tricuspid valve endocarditis suspected pacemaker lead infection and numerous other complications. He is now home and receiving IV Cefepime and Vancomycin 3 times a week with dialysis. He will complete IV antibiotics on 12/08/2021. He will then start oral Doxycycline and Amoxicillin on 12/09/2021 long term. He is followed closely by ID Dr. Darnelle [name].  PAD lead to right below the knee amputation (2021) and history of femoropopliteal bypass surgery.  He also had left foot trans-metatarsal amputation (2014).  He has chronic swelling at the amputation sites as well as fluid blisters that have burst. He has compression wraps on both extremities and states that he is followed closely by Dr. Doren [name] and Dr. Toni [name].  He states that he has an appointment with neurology coming up for chronic lower back  pain and endplate fractures at L2, L3 and L5 on recent x-ray.  No personal history of cancer. Family history includes daughter - lung, father - liver and brother - pancreatic.  He is diabetic and on insulin. He states that his most recent Hgb A1c was under 6.  No history of thyroid disease.  No fever, chills, n/v, cough, rash, dizziness, SOB, chest pain, palpitations, abdominal pain or changes in bowel or bladder habits.  History of ETOH abuse consuming 12 glasses of wine per day. He denies drinking at this time.  No smoking or recreational drug use.   His appetite is down but he is eating. He is staying well hydrated throughout the day. His weight is described as stable with dialysis. He was previously employed as a Administrator, Biochemist, clinical and also Teacher, early years/pre.   ROS: All other 10 point review of systems is negative.   PAST MEDICAL HISTORY:   Past Medical History:  Diagnosis Date   Acute blood loss anemia    Alcohol abuse    Anemia    Anemia of chronic disease    Arthritis    "patient does not think so."   Atrial fibrillation ([place])    Cardiac syncope 10/07/2014   rapid A fib with 8 sec pauses on converison with syncope- temp pacing wire placed then PPM   Cataract    BILATERAL   Coronary artery disease    Diabetes mellitus    dx---been  awhile   Elevated LFTs    Endocarditis of  tricuspid valve    ESRD (end stage renal disease) ([place])    Jay   GERD (gastroesophageal reflux disease)    History of blood transfusion    Hyperlipemia    Hypertension    Malnutrition ([place])    Osteomyelitis (HCC)    right foot   Peripheral vascular disease ([place])    Presence of permanent cardiac pacemaker 10/08/2014   Medtronic   Tricuspid valve vegetation     ALLERGIES: Allergies  Allergen Reactions   Cytoxan [Cyclophosphamide] Other (See Comments)    Pancytopenia   Pletal [Cilostazol] Palpitations and Other (See Comments)    "Can hear heart beating  loudly".   Keflex [Cephalexin] Nausea And Vomiting   Tylenol [Acetaminophen] Other (See Comments)    Patient has a cold sweat      MEDICATIONS:  Current Outpatient Medications on File Prior to Visit  Medication Sig Dispense Refill   ACCU-CHEK FASTCLIX LANCETS MISC Check blood sugar TID & QHS 102 each 2   apixaban (ELIQUIS) 5 MG TABS tablet Take 1 tablet (5 mg total) by mouth 2 (two) times daily. 180 tablet 3   atorvastatin (LIPITOR) 20 MG tablet Take 1 tablet (20 mg total) by mouth at bedtime.     blood glucose meter kit and supplies KIT Check blood sugar TID & QHS 1 each 0   Blood Glucose Monitoring Suppl (ACCU-CHEK ADVANTAGE DIABETES) kit Use as instructed 1 each 0   ceFEPIme 2 g in sodium chloride 0.9 % 100 mL Inject 2 g into the vein Every Tuesday,Thursday,and Saturday with dialysis.     furosemide (LASIX) 80 MG tablet Take 80 mg by mouth See admin instructions. Take 80 mg by mouth on (non-dialysis days) Mondays, Wednesdays, Fridays & Sundays in the morning as needed "to cause urination"     glucose blood (ACCU-CHEK AVIVA PLUS) test strip Use as instructed 100 each 12   glucose blood (CHOICE DM FORA G20 TEST STRIPS) test strip Check blood sugar TID & QHS 100 each 12   glucose blood test strip Use as instructed 100 each 12   lidocaine-prilocaine (EMLA) cream Apply 1 application topically every dialysis (on Tues/Thurs/Sat).     metoprolol tartrate (LOPRESSOR) 25 MG tablet Take 1 tablet (25 mg total) by mouth 2 (two) times daily. 180 tablet 3   Multiple Vitamin (MULTIVITAMIN WITH MINERALS) TABS tablet Take 1 tablet by mouth 2 (two) times a week.     NOVOLIN 70/30 (70-30) 100 UNIT/ML injection Inject 16 Units into the skin 2 (two) times daily with a meal.     oxyCODONE-acetaminophen (PERCOCET/ROXICET) 5-325 MG tablet Take 1 tablet by mouth every 8 (eight) hours as needed for moderate pain or severe pain. 15 tablet 0   pantoprazole (PROTONIX) 40 MG tablet Take 40 mg by mouth daily.      vancomycin (VANCOCIN) 1-5 GM/200ML-% SOLN Inject 200 mLs (1,000 mg total) into the vein Every Tuesday,Thursday,and Saturday with dialysis.     amoxicillin (AMOXIL) 500 MG capsule Take 1 capsule (500 mg total) by mouth at bedtime. (Patient not taking: Reported on 12/06/2021) 30 capsule 11   doxycycline (VIBRA-TABS) 100 MG tablet Take 1 tablet (100 mg total) by mouth 2 (two) times daily. (Patient not taking: Reported on 12/06/2021) 60 tablet 11   No current facility-administered medications on file prior to visit.     PAST SURGICAL HISTORY Past Surgical History:  Procedure Laterality Date   A/V FISTULAGRAM Right 05/01/2021   Procedure: A/V FISTULAGRAM;  Surgeon:  Waynetta Sandy, MD;  Location: [place] [facility];  Service: Cardiovascular;  Laterality: Right;  failed fistulagram   ABDOMINAL AORTOGRAM W/LOWER EXTREMITY N/A 09/09/2017   Procedure: ABDOMINAL AORTOGRAM W/LOWER EXTREMITY;  Surgeon: Angelia Mould, MD;  Location: [place] [facility];  Service: Cardiovascular;  Laterality: N/A;   ABDOMINAL AORTOGRAM W/LOWER EXTREMITY Right 11/11/2019   Procedure: ABDOMINAL AORTOGRAM W/LOWER EXTREMITY;  Surgeon: Marty Heck, MD;  Location: [place] [facility];  Service: Cardiovascular;  Laterality: Right;   AMPUTATION Left 08/19/2013   Procedure: AMPUTATION RAY;  Surgeon: Alta Corning, MD;  Location: [place];  Service: Orthopedics;  Laterality: Left;  ray amputation left 5th   AMPUTATION Left 10/27/2013   Procedure: AMPUTATION DIGIT-LEFT 4TH TOE, 4th and 5th metatarsal.;  Surgeon: Angelia Mould, MD;  Location: [place];  Service: Vascular;  Laterality: Left;   AMPUTATION Left 11/04/2013   Procedure: LEFT FOOT TRANS-METATARSAL AMPUTATION WITH WOUND CLOSURE ;  Surgeon: Alta Corning, MD;  Location: [place];  Service: Orthopedics;  Laterality: Left;   AMPUTATION Right 09/10/2017   Procedure: RIGHT FOURTH AND FIFTH TOE AMPUTATION;  Surgeon: Angelia Mould, MD;  Location: [place];  Service: Vascular;  Laterality: Right;   AMPUTATION Right 02/19/2020   Procedure: AMPUTATION RIGHT 3RD TOE;  Surgeon: Angelia Mould, MD;  Location: [place];  Service: Vascular;  Laterality: Right;   AMPUTATION Right 03/11/2020   Procedure: RIGHT BELOW KNEE AMPUTATION;  Surgeon: Newt Minion, MD;  Location: [place];  Service: Orthopedics;  Laterality: Right;   APPLICATION OF WOUND VAC Right 02/19/2020   Procedure: Application Of Wound Vac Right foot;  Surgeon: Angelia Mould, MD;  Location: [place];  Service: Vascular;  Laterality: Right;   AV FISTULA PLACEMENT Right 03/27/2021   Procedure: RIGHT ARM ARTERIOVENOUS (AV) FISTULA;  Surgeon: Angelia Mould, MD;  Location: [place];  Service: Vascular;  Laterality: Right;   AV FISTULA PLACEMENT Right 05/10/2021   Procedure: RIGHT ARM ARTERIOVENOUS (AV) FISTULA;  Surgeon: Waynetta Sandy, MD;  Location: [place];  Service: Vascular;  Laterality: Right;   [place] Right 08/11/2021   Procedure: RIGHT ARM SECOND STAGE BASILIC VEIN TRANSPOSITION;  Surgeon: Waynetta Sandy, MD;  Location: [place];  Service: Vascular;  Laterality: Right;   BIOPSY  07/20/2020   Procedure: BIOPSY;  Surgeon: Irving Copas., MD;  Location: [place];  Service: Gastroenterology;;   CARDIAC CATHETERIZATION  10/07/2014   Procedure: LEFT HEART CATH AND CORONARY ANGIOGRAPHY;  Surgeon: Laverda Page, MD;  Location: [facility] CATH LAB;  Service: Cardiovascular;;   COLONOSCOPY W/ POLYPECTOMY     COLONOSCOPY WITH PROPOFOL N/A 05/25/2020   Procedure: COLONOSCOPY WITH PROPOFOL;  Surgeon: Gatha Mayer, MD;  Location: [facility];  Service: Endoscopy;  Laterality: N/A;   EMBOLECTOMY Right 09/12/2017   Procedure: Thrombectomy  & Redo Right Below Knee Popliteal Artery Bypass Graft  ;  Surgeon: Waynetta Sandy, MD;  Location: [facility] OR;  Service: Vascular;  Laterality: Right;   ENTEROSCOPY N/A  07/20/2020   Procedure: ENTEROSCOPY;  Surgeon: Rush Landmark Telford Nab., MD;  Location: [place];  Service: Gastroenterology;  Laterality: N/A;   ESOPHAGOGASTRODUODENOSCOPY (EGD) WITH PROPOFOL N/A 05/24/2020   Procedure: ESOPHAGOGASTRODUODENOSCOPY (EGD) WITH PROPOFOL;  Surgeon: Ladene Artist, MD;  Location: [facility];  Service: Endoscopy;  Laterality: N/A;   EYE SURGERY Bilateral    cataract   FEMORAL-TIBIAL BYPASS GRAFT Left 10/27/2013  Procedure: BYPASS GRAFT LEFT FEMORAL- POSTERIOR TIBIAL ARTERY;  Surgeon: Angelia Mould, MD;  Location: [place];  Service: Vascular;  Laterality: Left;   FEMORAL-TIBIAL BYPASS GRAFT Right 09/10/2017   Procedure: RIGHT SUPERFICIAL  FEMORAL ARTERY-BELOW KNEE POPLITEAL ARTERY BYPASS GRAFT WITH VEIN;  Surgeon: Angelia Mould, MD;  Location: [place];  Service: Vascular;  Laterality: Right;   GIVENS CAPSULE STUDY N/A 07/20/2020   Procedure: GIVENS CAPSULE STUDY;  Surgeon: Irving Copas., MD;  Location: [place];  Service: Gastroenterology;  Laterality: N/A;  capsule placed through scope into duodenum @ 0936   I & D EXTREMITY Left 10/27/2013   Procedure: IRRIGATION AND DEBRIDEMENT EXTREMITY- LEFT FOOT;  Surgeon: Angelia Mould, MD;  Location: [place];  Service: Vascular;  Laterality: Left;   I & D EXTREMITY Right 01/26/2020   Procedure: IRRIGATION AND DEBRIDEMENT EXTREMITY RIGHT FOOT WOUND;  Surgeon: Angelia Mould, MD;  Location: [place];  Service: Vascular;  Laterality: Right;   INSERT / REPLACE / REMOVE PACEMAKER     INSERTION OF DIALYSIS CATHETER Right 11/03/2021   Procedure: RIGHT INTERNAL JUGULAR INSERTION OF TUNNELED DIALYSIS CATHETER;  Surgeon: Angelia Mould, MD;  Location: [place];  Service: Vascular;  Laterality: Right;   INTRAOPERATIVE ARTERIOGRAM Right 09/10/2017   Procedure: INTRA OPERATIVE ARTERIOGRAM;  Surgeon: Angelia Mould, MD;  Location: [place];  Service: Vascular;  Laterality: Right;   IR  ANGIOGRAM FOLLOW UP STUDY  09/09/2017   IR FLUORO GUIDE CV LINE RIGHT  05/31/2020   IR US GUIDE VASC ACCESS RIGHT  06/01/2020   LEFT HEART CATH AND CORONARY ANGIOGRAPHY N/A 08/19/2020   Procedure: LEFT HEART CATH AND CORONARY ANGIOGRAPHY;  Surgeon: Adrian Prows, MD;  Location: [place] [facility];  Service: Cardiovascular;  Laterality: N/A;   LEFT HEART CATHETERIZATION WITH CORONARY ANGIOGRAM N/A 11/09/2014   Procedure: LEFT HEART CATHETERIZATION WITH CORONARY ANGIOGRAM;  Surgeon: Laverda Page, MD;  Location: [facility] CATH LAB;  Service: Cardiovascular;  Laterality: N/A;   LOWER EXTREMITY ANGIOGRAM Right 11/08/2015   Procedure: Lower Extremity Angiogram;  Surgeon: Serafina Mitchell, MD;  Location: [place] [facility];  Service: Cardiovascular;  Laterality: Right;   LUMBAR LAMINECTOMY  06/13/2012   Procedure: MICRODISCECTOMY LUMBAR LAMINECTOMY;  Surgeon: Jessy Oto, MD;  Location: [place];  Service: Orthopedics;  Laterality: N/A;  Central laminectomy L2-3, L3-4, L4-5   PACEMAKER INSERTION  10/08/2014   MDT Advisa MRI compatible dual chamber pacemaker implanted by Dr Caryl Comes for syncope with post-termination pauses   PERCUTANEOUS CORONARY STENT INTERVENTION (PCI-S)  11/09/2014   des to lad & distal circumflex         Dr  [name]   PERIPHERAL VASCULAR BALLOON ANGIOPLASTY Right 11/11/2019   Procedure: PERIPHERAL VASCULAR BALLOON ANGIOPLASTY;  Surgeon: Marty Heck, MD;  Location: [place] [facility];  Service: Cardiovascular;  Laterality: Right;  below knee popliteal, tibioperoneal trunk, posterior tibial arteries   PERIPHERAL VASCULAR CATHETERIZATION N/A 11/08/2015   Procedure: Abdominal Aortogram;  Surgeon: Serafina Mitchell, MD;  Location: [place] [facility];  Service: Cardiovascular;  Laterality: N/A;   PERIPHERAL VASCULAR CATHETERIZATION Right 11/08/2015   Procedure: Peripheral Vascular Atherectomy;  Surgeon: Serafina Mitchell, MD;  Location: [place] [facility];  Service: Cardiovascular;  Laterality:  Right;   PERIPHERAL VASCULAR CATHETERIZATION N/A 10/10/2016   Procedure: Lower Extremity Angiography;  Surgeon: Waynetta Sandy, MD;  Location: [place] [facility];  Service: Cardiovascular;  Laterality: N/A;   PERIPHERAL VASCULAR CATHETERIZATION Right  10/10/2016   Procedure: Peripheral Vascular Atherectomy;  Surgeon: Waynetta Sandy, MD;  Location: [place] [facility];  Service: Cardiovascular;  Laterality: Right;  Popliteal   PERMANENT PACEMAKER INSERTION N/A 10/08/2014   Procedure: PERMANENT PACEMAKER INSERTION;  Surgeon: Deboraha Sprang, MD;  Location: [facility] CATH LAB;  Service: Cardiovascular;  Laterality: N/A;   SUBMUCOSAL TATTOO INJECTION  07/20/2020   Procedure: SUBMUCOSAL TATTOO INJECTION;  Surgeon: Irving Copas., MD;  Location: [place];  Service: Gastroenterology;;   TEE WITHOUT CARDIOVERSION N/A 10/27/2021   Procedure: TRANSESOPHAGEAL ECHOCARDIOGRAM (TEE);  Surgeon: Adrian Prows, MD;  Location: [facility] ENDOSCOPY;  Service: Cardiovascular;  Laterality: N/A;   TEMPORARY PACEMAKER INSERTION Bilateral 10/07/2014   Procedure: TEMPORARY PACEMAKER INSERTION;  Surgeon: Laverda Page, MD;  Location: [facility] CATH LAB;  Service: Cardiovascular;  Laterality: Bilateral;   WOUND DEBRIDEMENT Right 02/19/2020   Procedure: DEBRIDEMENT WOUND RIGHT FOOT;  Surgeon: Angelia Mould, MD;  Location: [facility] OR;  Service: Vascular;  Laterality: Right;    FAMILY HISTORY: Family History  Problem Relation Age of Onset   Diabetes type II Mother    Hypertension Mother    Diabetes Mother    Liver cancer Father    Diabetes type II Sister    Breast cancer Sister    Diabetes Sister    Hypertension Sister    Diabetes type II Brother    Kidney failure Brother    Diabetes Brother    Hypertension Brother    Diabetes type II Sister     SOCIAL HISTORY:  reports that he quit smoking about 7 years ago. His smoking use included cigarettes. He has a 45.00 pack-year smoking history. He has  never used smokeless tobacco. He reports current alcohol use of about 21.0 standard drinks per week. He reports that he does not use drugs.  PERFORMANCE STATUS: The patient's performance status is 2 - Symptomatic, <50% confined to bed  PHYSICAL EXAM: Most Recent Vital Signs: Blood pressure 117/67, pulse 92, temperature 98.2 F (36.8 C), temperature source Oral, resp. rate 19, height 6' (1.829 m), weight 236 lb 15.9 oz (107.5 kg), SpO2 100 %. BP 117/67 (BP Location: Left Arm, Patient Position: Sitting)    Pulse 92    Temp 98.2 F (36.8 C) (Oral)    Resp 19    Ht 6' (1.829 m)    Wt 236 lb 15.9 oz (107.5 kg)    SpO2 100%    BMI 32.14 kg/m   General Appearance:    Alert, cooperative, no distress, appears stated age  Head:    Normocephalic, without obvious abnormality, atraumatic  Eyes:    PERRL, conjunctiva/corneas clear, EOM's intact, fundi    benign, both eyes             Throat:   Lips, mucosa, and tongue normal; teeth and gums normal  Neck:   Supple, symmetrical, trachea midline, no adenopathy;       thyroid:  No enlargement/tenderness/nodules; no carotid   bruit or JVD  Back:     Symmetric, no curvature, ROM normal, no CVA tenderness  Lungs:     Clear to auscultation bilaterally, respirations unlabored  Chest wall:    No tenderness or deformity  Heart:    Regular rate and rhythm, S1 and S2 normal, no murmur, rub   or gallop  Abdomen:     Soft, non-tender, bowel sounds active all four quadrants,    no masses, no organomegaly        Extremities:  Extremities normal, atraumatic, no cyanosis or edema  Pulses:   2+ and symmetric all extremities  Skin:   Skin color, texture, turgor normal, no rashes or lesions  Lymph nodes:   Cervical, supraclavicular, and axillary nodes normal  Neurologic:   CNII-XII intact. Normal strength, sensation and reflexes      throughout    LABORATORY DATA:  Results for orders placed or performed in visit on 12/06/21 (from the past 48 hour(s))  CBC with  Differential ([place] Only)     Status: Abnormal   Collection Time: 12/06/21  8:40 AM  Result Value Ref Range   WBC Count 9.9 4.0 - 10.5 K/uL   RBC 2.57 (L) 4.22 - 5.81 MIL/uL   Hemoglobin 7.6 (L) 13.0 - 17.0 g/dL   HCT 25.8 (L) 39.0 - 52.0 %   MCV 100.4 (H) 80.0 - 100.0 fL   MCH 29.6 26.0 - 34.0 pg   MCHC 29.5 (L) 30.0 - 36.0 g/dL   RDW 22.3 (H) 11.5 - 15.5 %   Platelet Count 120 (L) 150 - 400 K/uL   nRBC 0.3 (H) 0.0 - 0.2 %   Neutrophils Relative % 66 %   Neutro Abs 6.5 1.7 - 7.7 K/uL   Lymphocytes Relative 25 %   Lymphs Abs 2.5 0.7 - 4.0 K/uL   Monocytes Relative 6 %   Monocytes Absolute 0.6 0.1 - 1.0 K/uL   Eosinophils Relative 1 %   Eosinophils Absolute 0.1 0.0 - 0.5 K/uL   Basophils Relative 1 %   Basophils Absolute 0.1 0.0 - 0.1 K/uL   Immature Granulocytes 1 %   Abs Immature Granulocytes 0.11 (H) 0.00 - 0.07 K/uL    Comment: Performed at [facility] Lab at [facility], [address] [place], [place] [address]  Save Smear [facility])     Status: None   Collection Time: 12/06/21  8:40 AM  Result Value Ref Range   Smear Review SMEAR STAINED AND AVAILABLE FOR REVIEW     Comment: Performed at [facility] Lab at [facility], [address], [place], [place] [address]  Reticulocytes     Status: Abnormal   Collection Time: 12/06/21  8:41 AM  Result Value Ref Range   Retic Ct Pct 3.8 (H) 0.4 - 3.1 %   RBC. 2.56 (L) 4.22 - 5.81 MIL/uL   Retic Count, Absolute 97.0 19.0 - 186.0 K/uL   Immature Retic Fract 33.2 (H) 2.3 - 15.9 %    Comment: Performed at [facility] Lab at [facility], [address] [place], [place] [address]      RADIOGRAPHY: No results found.     PATHOLOGY: None  ASSESSMENT/PLAN: Mr. Dudenhoeffer is a very pleasant 61 yo caucasian gentleman with recent pancytopenia.  CBC/CMP and blood smear reviewed with Dr. Marin Olp. No abnormality or evidence of malignancy noted.  Epo level is 113.  Iron saturation 29% and ferritin >1,650.  Patient's only complaint at this time is fatigue. He does not feel or appear toxic.  I was able to speak with his nephrologist Dr. Royce Macadamia and he is at the max dose every 2 weeks with Mircera.  For now we will continue to follow along. No transfusion at this time.  We will see him again in 3 weeks and re-evaluate at that time.   All questions were answered. The patient knows to call the clinic with any problems, questions or concerns. We can certainly  see the patient much sooner if necessary.  The patient was discussed with Dr. Marin Olp and he is in agreement with the aforementioned.   Lottie Dawson, NP

## 2021-12-06 NOTE — Telephone Encounter (Signed)
Lab informed me of a Creatinine of 3.02. Results given to Gillian Shields.

## 2021-12-07 DIAGNOSIS — Z992 Dependence on renal dialysis: Secondary | ICD-10-CM | POA: Diagnosis not present

## 2021-12-07 DIAGNOSIS — D509 Iron deficiency anemia, unspecified: Secondary | ICD-10-CM | POA: Diagnosis not present

## 2021-12-07 DIAGNOSIS — D631 Anemia in chronic kidney disease: Secondary | ICD-10-CM | POA: Diagnosis not present

## 2021-12-07 DIAGNOSIS — N2581 Secondary hyperparathyroidism of renal origin: Secondary | ICD-10-CM | POA: Diagnosis not present

## 2021-12-07 DIAGNOSIS — I38 Endocarditis, valve unspecified: Secondary | ICD-10-CM | POA: Diagnosis not present

## 2021-12-07 DIAGNOSIS — E0821 Diabetes mellitus due to underlying condition with diabetic nephropathy: Secondary | ICD-10-CM | POA: Diagnosis not present

## 2021-12-07 DIAGNOSIS — N186 End stage renal disease: Secondary | ICD-10-CM | POA: Diagnosis not present

## 2021-12-07 DIAGNOSIS — D689 Coagulation defect, unspecified: Secondary | ICD-10-CM | POA: Diagnosis not present

## 2021-12-07 LAB — ERYTHROPOIETIN: Erythropoietin: 113.5 m[IU]/mL — ABNORMAL HIGH (ref 2.6–18.5)

## 2021-12-08 ENCOUNTER — Telehealth: Payer: Self-pay | Admitting: *Deleted

## 2021-12-08 NOTE — Telephone Encounter (Signed)
No 12/06/21 los

## 2021-12-08 NOTE — Telephone Encounter (Signed)
Per 12/06/21 los - called and lvm of upcoming appointments- requested call back to confirm

## 2021-12-09 DIAGNOSIS — E0821 Diabetes mellitus due to underlying condition with diabetic nephropathy: Secondary | ICD-10-CM | POA: Diagnosis not present

## 2021-12-09 DIAGNOSIS — N186 End stage renal disease: Secondary | ICD-10-CM | POA: Diagnosis not present

## 2021-12-09 DIAGNOSIS — D631 Anemia in chronic kidney disease: Secondary | ICD-10-CM | POA: Diagnosis not present

## 2021-12-09 DIAGNOSIS — D689 Coagulation defect, unspecified: Secondary | ICD-10-CM | POA: Diagnosis not present

## 2021-12-09 DIAGNOSIS — I38 Endocarditis, valve unspecified: Secondary | ICD-10-CM | POA: Diagnosis not present

## 2021-12-09 DIAGNOSIS — D509 Iron deficiency anemia, unspecified: Secondary | ICD-10-CM | POA: Diagnosis not present

## 2021-12-09 DIAGNOSIS — N2581 Secondary hyperparathyroidism of renal origin: Secondary | ICD-10-CM | POA: Diagnosis not present

## 2021-12-09 DIAGNOSIS — Z992 Dependence on renal dialysis: Secondary | ICD-10-CM | POA: Diagnosis not present

## 2021-12-11 ENCOUNTER — Other Ambulatory Visit: Payer: Self-pay

## 2021-12-11 ENCOUNTER — Encounter (HOSPITAL_COMMUNITY): Payer: Self-pay | Admitting: *Deleted

## 2021-12-11 DIAGNOSIS — I480 Paroxysmal atrial fibrillation: Secondary | ICD-10-CM

## 2021-12-11 DIAGNOSIS — Z7901 Long term (current) use of anticoagulants: Secondary | ICD-10-CM

## 2021-12-11 MED ORDER — APIXABAN 5 MG PO TABS: 5.0000 mg | ORAL_TABLET | Freq: Two times a day (BID) | ORAL | 3 refills | Status: DC

## 2021-12-12 ENCOUNTER — Ambulatory Visit: Payer: Medicare Other | Admitting: Internal Medicine

## 2021-12-12 ENCOUNTER — Encounter (HOSPITAL_COMMUNITY): Payer: Medicare Other

## 2021-12-12 DIAGNOSIS — N2581 Secondary hyperparathyroidism of renal origin: Secondary | ICD-10-CM | POA: Diagnosis not present

## 2021-12-12 DIAGNOSIS — Z992 Dependence on renal dialysis: Secondary | ICD-10-CM | POA: Diagnosis not present

## 2021-12-12 DIAGNOSIS — D689 Coagulation defect, unspecified: Secondary | ICD-10-CM | POA: Diagnosis not present

## 2021-12-12 DIAGNOSIS — N186 End stage renal disease: Secondary | ICD-10-CM | POA: Diagnosis not present

## 2021-12-12 DIAGNOSIS — E0821 Diabetes mellitus due to underlying condition with diabetic nephropathy: Secondary | ICD-10-CM | POA: Diagnosis not present

## 2021-12-12 DIAGNOSIS — D509 Iron deficiency anemia, unspecified: Secondary | ICD-10-CM | POA: Diagnosis not present

## 2021-12-12 DIAGNOSIS — D631 Anemia in chronic kidney disease: Secondary | ICD-10-CM | POA: Diagnosis not present

## 2021-12-12 DIAGNOSIS — I38 Endocarditis, valve unspecified: Secondary | ICD-10-CM | POA: Diagnosis not present

## 2021-12-12 DIAGNOSIS — N179 Acute kidney failure, unspecified: Secondary | ICD-10-CM | POA: Diagnosis not present

## 2021-12-14 ENCOUNTER — Other Ambulatory Visit: Payer: Self-pay

## 2021-12-14 ENCOUNTER — Ambulatory Visit (INDEPENDENT_AMBULATORY_CARE_PROVIDER_SITE_OTHER): Payer: Medicare Other | Admitting: Physician Assistant

## 2021-12-14 ENCOUNTER — Ambulatory Visit (HOSPITAL_COMMUNITY)
Admission: RE | Admit: 2021-12-14 | Discharge: 2021-12-14 | Disposition: A | Discharge status: Home / Self Care | Payer: Medicare Other | Source: Ambulatory Visit | Attending: Vascular Surgery | Admitting: Vascular Surgery

## 2021-12-14 VITALS — BP 109/67 | HR 110 | Temp 97.3°F | Resp 20 | Ht 72.0 in

## 2021-12-14 DIAGNOSIS — N186 End stage renal disease: Secondary | ICD-10-CM | POA: Diagnosis not present

## 2021-12-14 DIAGNOSIS — N2581 Secondary hyperparathyroidism of renal origin: Secondary | ICD-10-CM | POA: Diagnosis not present

## 2021-12-14 DIAGNOSIS — D631 Anemia in chronic kidney disease: Secondary | ICD-10-CM | POA: Diagnosis not present

## 2021-12-14 DIAGNOSIS — E0821 Diabetes mellitus due to underlying condition with diabetic nephropathy: Secondary | ICD-10-CM | POA: Diagnosis not present

## 2021-12-14 DIAGNOSIS — D509 Iron deficiency anemia, unspecified: Secondary | ICD-10-CM | POA: Diagnosis not present

## 2021-12-14 DIAGNOSIS — I739 Peripheral vascular disease, unspecified: Secondary | ICD-10-CM

## 2021-12-14 DIAGNOSIS — Z992 Dependence on renal dialysis: Secondary | ICD-10-CM | POA: Diagnosis not present

## 2021-12-14 LAB — FERRITIN: Ferritin: 2365 ng/mL — ABNORMAL HIGH (ref 24–336)

## 2021-12-14 NOTE — Progress Notes (Signed)
Office Note     CC:  follow up Requesting Provider:  [name], MD  HPI: Alexander [name] is a 61 y.o. (April 24, 1961) male who presents for follow up of AVF and PAD. He has a functioning right arm basilic vein fistula that he has been using for dialysis now for couple weeks. He had his right IJ [facility] removed yesterday without any issues. He denies any pain, numbness, weakness, or coldness in his right arm or hand. He dialyzes on MWF.  He has history of right BKA and left TMA. He sees [name] for management of these. He does have a left femoral to tibial bypass that is patent.   The pt is on a statin for cholesterol management.  The pt is not on a daily aspirin.   Other AC:  Eliquis The pt is on BB for hypertension.   The pt is diabetic.  Tobacco hx:  former, quit 2015  Past Medical History:  Diagnosis Date   Acute blood loss anemia    Alcohol abuse    Anemia    Anemia of chronic disease    Arthritis    "patient does not think so."   Atrial fibrillation ([place])    Cardiac syncope 10/07/2014   rapid A fib with 8 sec pauses on converison with syncope- temp pacing wire placed then PPM   Cataract    BILATERAL   Coronary artery disease    Diabetes mellitus    dx---been  awhile   Elevated LFTs    Endocarditis of tricuspid valve    ESRD (end stage renal disease) ([place])    [place]   GERD (gastroesophageal reflux disease)    History of blood transfusion    Hyperlipemia    Hypertension    Malnutrition ([place])    Osteomyelitis ([place])    right foot   Peripheral vascular disease ([place])    Presence of permanent cardiac pacemaker 10/08/2014   Medtronic   Tricuspid valve vegetation     Past Surgical History:  Procedure Laterality Date   A/V FISTULAGRAM Right 05/01/2021   Procedure: A/V FISTULAGRAM;  Surgeon: Waynetta Sandy, MD;  Location: [place] [facility];  Service: Cardiovascular;  Laterality: Right;  failed fistulagram   ABDOMINAL AORTOGRAM W/LOWER  EXTREMITY N/A 09/09/2017   Procedure: ABDOMINAL AORTOGRAM W/LOWER EXTREMITY;  Surgeon: Angelia Mould, MD;  Location: [place] [facility];  Service: Cardiovascular;  Laterality: N/A;   ABDOMINAL AORTOGRAM W/LOWER EXTREMITY Right 11/11/2019   Procedure: ABDOMINAL AORTOGRAM W/LOWER EXTREMITY;  Surgeon: Marty Heck, MD;  Location: [place] [facility];  Service: Cardiovascular;  Laterality: Right;   AMPUTATION Left 08/19/2013   Procedure: AMPUTATION RAY;  Surgeon: Alta Corning, MD;  Location: [place];  Service: Orthopedics;  Laterality: Left;  ray amputation left 5th   AMPUTATION Left 10/27/2013   Procedure: AMPUTATION DIGIT-LEFT 4TH TOE, 4th and 5th metatarsal.;  Surgeon: Angelia Mould, MD;  Location: [place];  Service: Vascular;  Laterality: Left;   AMPUTATION Left 11/04/2013   Procedure: LEFT FOOT TRANS-METATARSAL AMPUTATION WITH WOUND CLOSURE ;  Surgeon: Alta Corning, MD;  Location: [place];  Service: Orthopedics;  Laterality: Left;   AMPUTATION Right 09/10/2017   Procedure: RIGHT FOURTH AND FIFTH TOE AMPUTATION;  Surgeon: Angelia Mould, MD;  Location: [place];  Service: Vascular;  Laterality: Right;   AMPUTATION Right 02/19/2020   Procedure: AMPUTATION RIGHT 3RD TOE;  Surgeon: Angelia Mould, MD;  Location: [place];  Service: Vascular;  Laterality: Right;   AMPUTATION Right 03/11/2020   Procedure: RIGHT BELOW KNEE AMPUTATION;  Surgeon: Newt Minion, MD;  Location: [place];  Service: Orthopedics;  Laterality: Right;   APPLICATION OF WOUND VAC Right 02/19/2020   Procedure: Application Of Wound Vac Right foot;  Surgeon: Angelia Mould, MD;  Location: [place];  Service: Vascular;  Laterality: Right;   AV FISTULA PLACEMENT Right 03/27/2021   Procedure: RIGHT ARM ARTERIOVENOUS (AV) FISTULA;  Surgeon: Angelia Mould, MD;  Location: [place];  Service: Vascular;  Laterality: Right;   AV FISTULA PLACEMENT Right 05/10/2021   Procedure: RIGHT ARM ARTERIOVENOUS  (AV) FISTULA;  Surgeon: Waynetta Sandy, MD;  Location: [place];  Service: Vascular;  Laterality: Right;   [place] Right 08/11/2021   Procedure: RIGHT ARM SECOND STAGE BASILIC VEIN TRANSPOSITION;  Surgeon: Waynetta Sandy, MD;  Location: [place];  Service: Vascular;  Laterality: Right;   BIOPSY  07/20/2020   Procedure: BIOPSY;  Surgeon: Irving Copas., MD;  Location: [place];  Service: Gastroenterology;;   CARDIAC CATHETERIZATION  10/07/2014   Procedure: LEFT HEART CATH AND CORONARY ANGIOGRAPHY;  Surgeon: Laverda Page, MD;  Location: [facility] CATH LAB;  Service: Cardiovascular;;   COLONOSCOPY W/ POLYPECTOMY     COLONOSCOPY WITH PROPOFOL N/A 05/25/2020   Procedure: COLONOSCOPY WITH PROPOFOL;  Surgeon: Gatha Mayer, MD;  Location: [facility];  Service: Endoscopy;  Laterality: N/A;   EMBOLECTOMY Right 09/12/2017   Procedure: Thrombectomy  & Redo Right Below Knee Popliteal Artery Bypass Graft  ;  Surgeon: Waynetta Sandy, MD;  Location: [facility] OR;  Service: Vascular;  Laterality: Right;   ENTEROSCOPY N/A 07/20/2020   Procedure: ENTEROSCOPY;  Surgeon: Rush Landmark Telford Nab., MD;  Location: [place];  Service: Gastroenterology;  Laterality: N/A;   ESOPHAGOGASTRODUODENOSCOPY (EGD) WITH PROPOFOL N/A 05/24/2020   Procedure: ESOPHAGOGASTRODUODENOSCOPY (EGD) WITH PROPOFOL;  Surgeon: Ladene Artist, MD;  Location: [facility];  Service: Endoscopy;  Laterality: N/A;   EYE SURGERY Bilateral    cataract   FEMORAL-TIBIAL BYPASS GRAFT Left 10/27/2013   Procedure: BYPASS GRAFT LEFT FEMORAL- POSTERIOR TIBIAL ARTERY;  Surgeon: Angelia Mould, MD;  Location: [place];  Service: Vascular;  Laterality: Left;   FEMORAL-TIBIAL BYPASS GRAFT Right 09/10/2017   Procedure: RIGHT SUPERFICIAL  FEMORAL ARTERY-BELOW KNEE POPLITEAL ARTERY BYPASS GRAFT WITH VEIN;  Surgeon: Angelia Mould, MD;  Location: [place];  Service: Vascular;   Laterality: Right;   GIVENS CAPSULE STUDY N/A 07/20/2020   Procedure: GIVENS CAPSULE STUDY;  Surgeon: Irving Copas., MD;  Location: [place];  Service: Gastroenterology;  Laterality: N/A;  capsule placed through scope into duodenum @ 0936   I & D EXTREMITY Left 10/27/2013   Procedure: IRRIGATION AND DEBRIDEMENT EXTREMITY- LEFT FOOT;  Surgeon: Angelia Mould, MD;  Location: [place];  Service: Vascular;  Laterality: Left;   I & D EXTREMITY Right 01/26/2020   Procedure: IRRIGATION AND DEBRIDEMENT EXTREMITY RIGHT FOOT WOUND;  Surgeon: Angelia Mould, MD;  Location: [place];  Service: Vascular;  Laterality: Right;   INSERT / REPLACE / REMOVE PACEMAKER     INSERTION OF DIALYSIS CATHETER Right 11/03/2021   Procedure: RIGHT INTERNAL JUGULAR INSERTION OF TUNNELED DIALYSIS CATHETER;  Surgeon: Angelia Mould, MD;  Location: [place];  Service: Vascular;  Laterality: Right;   INTRAOPERATIVE ARTERIOGRAM Right 09/10/2017   Procedure: INTRA OPERATIVE ARTERIOGRAM;  Surgeon: Angelia Mould, MD;  Location: [facility]  OR;  Service: Vascular;  Laterality: Right;   IR ANGIOGRAM FOLLOW UP STUDY  09/09/2017   IR FLUORO GUIDE CV LINE RIGHT  05/31/2020   IR US GUIDE VASC ACCESS RIGHT  06/01/2020   LEFT HEART CATH AND CORONARY ANGIOGRAPHY N/A 08/19/2020   Procedure: LEFT HEART CATH AND CORONARY ANGIOGRAPHY;  Surgeon: Adrian Prows, MD;  Location: [place] [facility];  Service: Cardiovascular;  Laterality: N/A;   LEFT HEART CATHETERIZATION WITH CORONARY ANGIOGRAM N/A 11/09/2014   Procedure: LEFT HEART CATHETERIZATION WITH CORONARY ANGIOGRAM;  Surgeon: Laverda Page, MD;  Location: [facility] CATH LAB;  Service: Cardiovascular;  Laterality: N/A;   LOWER EXTREMITY ANGIOGRAM Right 11/08/2015   Procedure: Lower Extremity Angiogram;  Surgeon: Serafina Mitchell, MD;  Location: [place] [facility];  Service: Cardiovascular;  Laterality: Right;   LUMBAR LAMINECTOMY  06/13/2012   Procedure: MICRODISCECTOMY  LUMBAR LAMINECTOMY;  Surgeon: Jessy Oto, MD;  Location: [place];  Service: Orthopedics;  Laterality: N/A;  Central laminectomy L2-3, L3-4, L4-5   PACEMAKER INSERTION  10/08/2014   MDT Advisa MRI compatible dual chamber pacemaker implanted by Dr Caryl Comes for syncope with post-termination pauses   PERCUTANEOUS CORONARY STENT INTERVENTION (PCI-S)  11/09/2014   des to lad & distal circumflex         Dr  [name]   PERIPHERAL VASCULAR BALLOON ANGIOPLASTY Right 11/11/2019   Procedure: PERIPHERAL VASCULAR BALLOON ANGIOPLASTY;  Surgeon: Marty Heck, MD;  Location: [place] [facility];  Service: Cardiovascular;  Laterality: Right;  below knee popliteal, tibioperoneal trunk, posterior tibial arteries   PERIPHERAL VASCULAR CATHETERIZATION N/A 11/08/2015   Procedure: Abdominal Aortogram;  Surgeon: Serafina Mitchell, MD;  Location: [place] [facility];  Service: Cardiovascular;  Laterality: N/A;   PERIPHERAL VASCULAR CATHETERIZATION Right 11/08/2015   Procedure: Peripheral Vascular Atherectomy;  Surgeon: Serafina Mitchell, MD;  Location: [place] [facility];  Service: Cardiovascular;  Laterality: Right;   PERIPHERAL VASCULAR CATHETERIZATION N/A 10/10/2016   Procedure: Lower Extremity Angiography;  Surgeon: Waynetta Sandy, MD;  Location: [place] [facility];  Service: Cardiovascular;  Laterality: N/A;   PERIPHERAL VASCULAR CATHETERIZATION Right 10/10/2016   Procedure: Peripheral Vascular Atherectomy;  Surgeon: Waynetta Sandy, MD;  Location: [place] [facility];  Service: Cardiovascular;  Laterality: Right;  Popliteal   PERMANENT PACEMAKER INSERTION N/A 10/08/2014   Procedure: PERMANENT PACEMAKER INSERTION;  Surgeon: Deboraha Sprang, MD;  Location: [facility] CATH LAB;  Service: Cardiovascular;  Laterality: N/A;   SUBMUCOSAL TATTOO INJECTION  07/20/2020   Procedure: SUBMUCOSAL TATTOO INJECTION;  Surgeon: Irving Copas., MD;  Location: [place];  Service: Gastroenterology;;   TEE WITHOUT  CARDIOVERSION N/A 10/27/2021   Procedure: TRANSESOPHAGEAL ECHOCARDIOGRAM (TEE);  Surgeon: Adrian Prows, MD;  Location: [facility] ENDOSCOPY;  Service: Cardiovascular;  Laterality: N/A;   TEMPORARY PACEMAKER INSERTION Bilateral 10/07/2014   Procedure: TEMPORARY PACEMAKER INSERTION;  Surgeon: Laverda Page, MD;  Location: [facility] CATH LAB;  Service: Cardiovascular;  Laterality: Bilateral;   WOUND DEBRIDEMENT Right 02/19/2020   Procedure: DEBRIDEMENT WOUND RIGHT FOOT;  Surgeon: Angelia Mould, MD;  Location: [facility] OR;  Service: Vascular;  Laterality: Right;    Social History   Socioeconomic History   Marital status: Married    Spouse name: Not on file   Number of children: 2   Years of education: Not on file   Highest education level: Not on file  Occupational History   Occupation: Disabled  Tobacco Use   Smoking status: Former    Packs/day:  1.50    Years: 30.00    Pack years: 45.00    Types: Cigarettes    Quit date: 10/07/2014    Years since quitting: 7.1   Smokeless tobacco: Never   Tobacco comments:    Does an occasional cigar  Vaping Use   Vaping Use: Never used  Substance and Sexual Activity   Alcohol use: Yes    Alcohol/week: 21.0 standard drinks    Types: 21 Glasses of wine per week    Comment: cut way back, none in the last month   Drug use: No   Sexual activity: Not on file  Other Topics Concern   Not on file  Social History Narrative   Not on file   Social Determinants of Health   Financial Resource Strain: Not on file  Food Insecurity: Not on file  Transportation Needs: Not on file  Physical Activity: Not on file  Stress: Not on file  Social Connections: Not on file  Intimate Partner Violence: Not on file    Family History  Problem Relation Age of Onset   Diabetes type II Mother    Hypertension Mother    Diabetes Mother    Liver cancer Father    Diabetes type II Sister    Breast cancer Sister    Diabetes Sister    Hypertension Sister    Diabetes type  II Brother    Kidney failure Brother    Diabetes Brother    Hypertension Brother    Diabetes type II Sister     Current Outpatient Medications  Medication Sig Dispense Refill   ACCU-CHEK FASTCLIX LANCETS MISC Check blood sugar TID & QHS 102 each 2   amoxicillin (AMOXIL) 500 MG capsule Take 1 capsule (500 mg total) by mouth at bedtime. 30 capsule 11   apixaban (ELIQUIS) 5 MG TABS tablet Take 1 tablet (5 mg total) by mouth 2 (two) times daily. 180 tablet 3   atorvastatin (LIPITOR) 20 MG tablet Take 1 tablet (20 mg total) by mouth at bedtime.     blood glucose meter kit and supplies KIT Check blood sugar TID & QHS 1 each 0   Blood Glucose Monitoring Suppl (ACCU-CHEK ADVANTAGE DIABETES) kit Use as instructed 1 each 0   doxycycline (VIBRA-TABS) 100 MG tablet Take 1 tablet (100 mg total) by mouth 2 (two) times daily. 60 tablet 11   furosemide (LASIX) 80 MG tablet Take 80 mg by mouth See admin instructions. Take 80 mg by mouth on (non-dialysis days) Mondays, Wednesdays, Fridays & Sundays in the morning as needed "to cause urination"     glucose blood (ACCU-CHEK AVIVA PLUS) test strip Use as instructed 100 each 12   glucose blood (CHOICE DM FORA G20 TEST STRIPS) test strip Check blood sugar TID & QHS 100 each 12   glucose blood test strip Use as instructed 100 each 12   lidocaine-prilocaine (EMLA) cream Apply 1 application topically every dialysis (on Tues/Thurs/Sat).     metoprolol tartrate (LOPRESSOR) 25 MG tablet Take 1 tablet (25 mg total) by mouth 2 (two) times daily. 180 tablet 3   Multiple Vitamin (MULTIVITAMIN WITH MINERALS) TABS tablet Take 1 tablet by mouth 2 (two) times a week.     NOVOLIN 70/30 (70-30) 100 UNIT/ML injection Inject 16 Units into the skin 2 (two) times daily with a meal.     oxyCODONE-acetaminophen (PERCOCET/ROXICET) 5-325 MG tablet Take 1 tablet by mouth every 8 (eight) hours as needed for moderate pain or severe pain. 15 tablet  0   pantoprazole (PROTONIX) 40 MG tablet  Take 40 mg by mouth daily.     No current facility-administered medications for this visit.    Allergies  Allergen Reactions   Cytoxan [Cyclophosphamide] Other (See Comments)    Pancytopenia   Pletal [Cilostazol] Palpitations and Other (See Comments)    "Can hear heart beating loudly".   Keflex [Cephalexin] Nausea And Vomiting   Tylenol [Acetaminophen] Other (See Comments)    Patient has a cold sweat     REVIEW OF SYSTEMS:   _0  denotes positive finding, _1  denotes negative finding Cardiac  Comments:  Chest pain or chest pressure:    Shortness of breath upon exertion:    Short of breath when lying flat:    Irregular heart rhythm:        Vascular    Pain in calf, thigh, or hip brought on by ambulation:    Pain in feet at night that wakes you up from your sleep:     Blood clot in your veins:    Leg swelling:         Pulmonary    Oxygen at home:    Productive cough:     Wheezing:         Neurologic    Sudden weakness in arms or legs:     Sudden numbness in arms or legs:     Sudden onset of difficulty speaking or slurred speech:    Temporary loss of vision in one eye:     Problems with dizziness:         Gastrointestinal    Blood in stool:     Vomited blood:         Genitourinary    Burning when urinating:     Blood in urine:        Psychiatric    Major depression:         Hematologic    Bleeding problems:    Problems with blood clotting too easily:        Skin    Rashes or ulcers:        Constitutional    Fever or chills:      PHYSICAL EXAMINATION:  Vitals:   12/14/21 1139  BP: 109/67  Pulse: (!) 110  Resp: 20  Temp: (!) 97.3 F (36.3 C)  TempSrc: Temporal  SpO2: 96%  Height: 6' (1.829 m)    General:  WDWN in NAD; vital signs documented above Gait: Not observed, in wheel chair HENT: WNL, normocephalic Pulmonary: normal non-labored breathing , without wheezing Cardiac: regular HR, without Murmurs Vascular Exam/Pulses: 2+ radial pulses  bilaterally. Right AV fistula with good thrill Right BKA and left TMA dressed Musculoskeletal: no muscle wasting or atrophy  Neurologic: A&O X 3;  No focal weakness or paresthesias are detected Psychiatric:  The pt has Normal affect.  ASSESSMENT/PLAN:: 61 y.o. male here for follow up for right AV fistula and PAD. He has a functioning right arm basilic vein fistula. He had his right IJ [facility] removed yesterday without any issues. He has no signs or symptoms of steal. He has a right BKA and left TMA that both have some blistering. These are managed by [name]. He has a patent left fem- tibial bypass.  - He knows to call for earlier follow up if continued or worsening issues with wounds or issues with his fistula - continue statin and Eliquis -He will follow up in 6 months with LLE bypass graft duplex  and ABIs   Karoline Caldwell, PA-C [facility] [phone]  Clinic MD:   [place]/ Trula Slade

## 2021-12-15 ENCOUNTER — Ambulatory Visit: Payer: Medicare Other | Admitting: Internal Medicine

## 2021-12-15 ENCOUNTER — Ambulatory Visit: Payer: Medicare Other | Admitting: Physician Assistant

## 2021-12-16 DIAGNOSIS — D631 Anemia in chronic kidney disease: Secondary | ICD-10-CM | POA: Diagnosis not present

## 2021-12-16 DIAGNOSIS — Z992 Dependence on renal dialysis: Secondary | ICD-10-CM | POA: Diagnosis not present

## 2021-12-16 DIAGNOSIS — N186 End stage renal disease: Secondary | ICD-10-CM | POA: Diagnosis not present

## 2021-12-16 DIAGNOSIS — N2581 Secondary hyperparathyroidism of renal origin: Secondary | ICD-10-CM | POA: Diagnosis not present

## 2021-12-16 DIAGNOSIS — D509 Iron deficiency anemia, unspecified: Secondary | ICD-10-CM | POA: Diagnosis not present

## 2021-12-16 DIAGNOSIS — E0821 Diabetes mellitus due to underlying condition with diabetic nephropathy: Secondary | ICD-10-CM | POA: Diagnosis not present

## 2021-12-19 ENCOUNTER — Other Ambulatory Visit (HOSPITAL_COMMUNITY): Payer: Self-pay | Admitting: Neurosurgery

## 2021-12-19 DIAGNOSIS — N186 End stage renal disease: Secondary | ICD-10-CM | POA: Diagnosis not present

## 2021-12-19 DIAGNOSIS — M5440 Lumbago with sciatica, unspecified side: Secondary | ICD-10-CM | POA: Diagnosis not present

## 2021-12-19 DIAGNOSIS — D631 Anemia in chronic kidney disease: Secondary | ICD-10-CM | POA: Diagnosis not present

## 2021-12-19 DIAGNOSIS — Z992 Dependence on renal dialysis: Secondary | ICD-10-CM | POA: Diagnosis not present

## 2021-12-19 DIAGNOSIS — E0821 Diabetes mellitus due to underlying condition with diabetic nephropathy: Secondary | ICD-10-CM | POA: Diagnosis not present

## 2021-12-19 DIAGNOSIS — D509 Iron deficiency anemia, unspecified: Secondary | ICD-10-CM | POA: Diagnosis not present

## 2021-12-19 DIAGNOSIS — N2581 Secondary hyperparathyroidism of renal origin: Secondary | ICD-10-CM | POA: Diagnosis not present

## 2021-12-20 ENCOUNTER — Other Ambulatory Visit: Payer: Self-pay

## 2021-12-20 ENCOUNTER — Inpatient Hospital Stay (HOSPITAL_BASED_OUTPATIENT_CLINIC_OR_DEPARTMENT_OTHER): Payer: Medicare Other | Admitting: Family

## 2021-12-20 ENCOUNTER — Encounter: Payer: Self-pay | Admitting: Family

## 2021-12-20 ENCOUNTER — Inpatient Hospital Stay: Payer: Medicare Other | Attending: Family

## 2021-12-20 VITALS — BP 128/65 | HR 56 | Temp 98.1°F | Resp 18

## 2021-12-20 DIAGNOSIS — N186 End stage renal disease: Secondary | ICD-10-CM | POA: Diagnosis not present

## 2021-12-20 DIAGNOSIS — M549 Dorsalgia, unspecified: Secondary | ICD-10-CM | POA: Diagnosis not present

## 2021-12-20 DIAGNOSIS — D61818 Other pancytopenia: Secondary | ICD-10-CM | POA: Diagnosis not present

## 2021-12-20 DIAGNOSIS — Z992 Dependence on renal dialysis: Secondary | ICD-10-CM | POA: Insufficient documentation

## 2021-12-20 DIAGNOSIS — G8929 Other chronic pain: Secondary | ICD-10-CM | POA: Insufficient documentation

## 2021-12-20 DIAGNOSIS — D649 Anemia, unspecified: Secondary | ICD-10-CM | POA: Diagnosis not present

## 2021-12-20 DIAGNOSIS — D509 Iron deficiency anemia, unspecified: Secondary | ICD-10-CM | POA: Diagnosis not present

## 2021-12-20 LAB — CMP (CANCER CENTER ONLY)
ALT: 10 U/L (ref 0–44)
AST: 13 U/L — ABNORMAL LOW (ref 15–41)
Albumin: 2.9 g/dL — ABNORMAL LOW (ref 3.5–5.0)
Alkaline Phosphatase: 99 U/L (ref 38–126)
Anion gap: 11 (ref 5–15)
BUN: 18 mg/dL (ref 8–23)
CO2: 30 mmol/L (ref 22–32)
Calcium: 9 mg/dL (ref 8.9–10.3)
Chloride: 100 mmol/L (ref 98–111)
Creatinine: 2.65 mg/dL — ABNORMAL HIGH (ref 0.61–1.24)
GFR, Estimated: 26 mL/min — ABNORMAL LOW (ref >60)
Glucose, Bld: 98 mg/dL (ref 70–99)
Potassium: 5.3 mmol/L — ABNORMAL HIGH (ref 3.5–5.1)
Sodium: 141 mmol/L (ref 135–145)
Total Bilirubin: 1.1 mg/dL (ref 0.3–1.2)
Total Protein: 6.1 g/dL — ABNORMAL LOW (ref 6.5–8.1)

## 2021-12-20 LAB — CBC WITH DIFFERENTIAL (CANCER CENTER ONLY)
Abs Immature Granulocytes: 0.04 10*3/uL (ref 0.00–0.07)
Basophils Absolute: 0.1 10*3/uL (ref 0.0–0.1)
Basophils Relative: 1 %
Eosinophils Absolute: 0.1 10*3/uL (ref 0.0–0.5)
Eosinophils Relative: 1 %
HCT: 30.9 % — ABNORMAL LOW (ref 39.0–52.0)
Hemoglobin: 9 g/dL — ABNORMAL LOW (ref 13.0–17.0)
Immature Granulocytes: 0 %
Lymphocytes Relative: 22 %
Lymphs Abs: 2.2 10*3/uL (ref 0.7–4.0)
MCH: 30.7 pg (ref 26.0–34.0)
MCHC: 29.1 g/dL — ABNORMAL LOW (ref 30.0–36.0)
MCV: 105.5 fL — ABNORMAL HIGH (ref 80.0–100.0)
Monocytes Absolute: 0.7 10*3/uL (ref 0.1–1.0)
Monocytes Relative: 7 %
Neutro Abs: 6.8 10*3/uL (ref 1.7–7.7)
Neutrophils Relative %: 69 %
Platelet Count: 117 10*3/uL — ABNORMAL LOW (ref 150–400)
RBC: 2.93 MIL/uL — ABNORMAL LOW (ref 4.22–5.81)
RDW: 20.2 % — ABNORMAL HIGH (ref 11.5–15.5)
WBC Count: 10 10*3/uL (ref 4.0–10.5)
nRBC: 0 % (ref 0.0–0.2)

## 2021-12-20 LAB — IRON AND IRON BINDING CAPACITY (CC-WL,HP ONLY)
Iron: 28 ug/dL — ABNORMAL LOW (ref 45–182)
Saturation Ratios: 27 % (ref 17.9–39.5)
TIBC: 104 ug/dL — ABNORMAL LOW (ref 250–450)
UIBC: 76 ug/dL — ABNORMAL LOW (ref 117–376)

## 2021-12-20 LAB — SAMPLE TO BLOOD BANK

## 2021-12-20 LAB — FERRITIN: Ferritin: 2208 ng/mL — ABNORMAL HIGH (ref 24–336)

## 2021-12-20 NOTE — Progress Notes (Signed)
Hematology and Oncology Follow Up Visit  HOLT WOOLBRIGHT [id number] [birth year] 61 y.o. 12/20/2021   Principle Diagnosis:  Pancytopenia  Current Therapy:   Observation   Interim History:  Mr. Alberto is here today for follow-up. He is doing well but states that he has chronic back pain that is bothersome.  He states that dialysis is going well.  No blood loss noted. No abnormal bruising or petechiae.  Hgb is improved at 9.0, MCV 105, platelets 117 and WBC count 10.  No fever, chills, n/v, cough, rash, dizziness, SOB, chest pain, palpitations, abdominal pain or changes in bowel or bladder habits.  No changes in chronic swelling with amputation sites of both lower extremities.  No falls or syncope to report.  His appetite comes and goes but he is trying to eat regularly. He feels that he is able to hydrate well throughout the day on restrictions. He states that his weight at dialysis is stable.   ECOG Performance Status: 1 - Symptomatic but completely ambulatory  Medications:  Allergies as of 12/20/2021       Reactions   Cytoxan [cyclophosphamide] Other (See Comments)   Pancytopenia   Pletal [cilostazol] Palpitations, Other (See Comments)   "Can hear heart beating loudly".   Keflex [cephalexin] Nausea And Vomiting   Tylenol [acetaminophen] Other (See Comments)   Patient has a cold sweat        Medication List        Accurate as of December 20, 2021 10:08 AM. If you have any questions, ask your nurse or doctor.          Accu-Chek Advantage Diabetes kit Use as instructed   Accu-Chek FastClix Lancets Misc Check blood sugar TID & QHS   amoxicillin 500 MG capsule Commonly known as: AMOXIL Take 1 capsule (500 mg total) by mouth at bedtime.   apixaban 5 MG Tabs tablet Commonly known as: Eliquis Take 1 tablet (5 mg total) by mouth 2 (two) times daily.   atorvastatin 20 MG tablet Commonly known as: LIPITOR Take 1 tablet (20 mg total) by mouth at bedtime.   blood  glucose meter kit and supplies Kit Check blood sugar TID & QHS   doxycycline 100 MG tablet Commonly known as: VIBRA-TABS Take 1 tablet (100 mg total) by mouth 2 (two) times daily.   furosemide 80 MG tablet Commonly known as: LASIX Take 80 mg by mouth See admin instructions. Take 80 mg by mouth on (non-dialysis days) Mondays, Wednesdays, Fridays & Sundays in the morning as needed "to cause urination"   glucose blood test strip Use as instructed   glucose blood test strip Commonly known as: Accu-Chek Aviva Plus Use as instructed   glucose blood test strip Commonly known as: Choice DM Fora G20 Test Strips Check blood sugar TID & QHS   lidocaine-prilocaine cream Commonly known as: EMLA Apply 1 application topically every dialysis (on Tues/Thurs/Sat).   metoprolol tartrate 25 MG tablet Commonly known as: LOPRESSOR Take 1 tablet (25 mg total) by mouth 2 (two) times daily.   multivitamin with minerals Tabs tablet Take 1 tablet by mouth 2 (two) times a week.   NovoLIN 70/30 (70-30) 100 UNIT/ML injection Generic drug: insulin NPH-regular Human Inject 16 Units into the skin 2 (two) times daily with a meal.   oxyCODONE-acetaminophen 5-325 MG tablet Commonly known as: PERCOCET/ROXICET Take 1 tablet by mouth every 8 (eight) hours as needed for moderate pain or severe pain.   pantoprazole 40 MG tablet Commonly known as: PROTONIX  Take 40 mg by mouth daily.        Allergies:  Allergies  Allergen Reactions   Cytoxan [Cyclophosphamide] Other (See Comments)    Pancytopenia   Pletal [Cilostazol] Palpitations and Other (See Comments)    "Can hear heart beating loudly".   Keflex [Cephalexin] Nausea And Vomiting   Tylenol [Acetaminophen] Other (See Comments)    Patient has a cold sweat    Past Medical History, Surgical history, Social history, and Family History were reviewed and updated.  Review of Systems: All other 10 point review of systems is negative.   Physical  Exam:  vitals were not taken for this visit.   Wt Readings from Last 3 Encounters:  12/06/21 236 lb 15.9 oz (107.5 kg)  12/01/21 237 lb (107.5 kg)  11/04/21 235 lb 14.3 oz (107 kg)    Ocular: Sclerae unicteric, pupils equal, round and reactive to light Ear-nose-throat: Oropharynx clear, dentition fair Lymphatic: No cervical or supraclavicular adenopathy Lungs no rales or rhonchi, good excursion bilaterally Heart regular rate and rhythm, no murmur appreciated Abd soft, nontender, positive bowel sounds MSK no focal spinal tenderness, no joint edema Neuro: non-focal, well-oriented, appropriate affect Breasts: Deferred   Lab Results  Component Value Date   WBC 10.0 12/20/2021   HGB 9.0 (L) 12/20/2021   HCT 30.9 (L) 12/20/2021   MCV 105.5 (H) 12/20/2021   PLT 117 (L) 12/20/2021   Lab Results  Component Value Date   FERRITIN 2,365 (H) 12/06/2021   IRON 29 (L) 12/06/2021   TIBC 99 (L) 12/06/2021   UIBC 70 (L) 12/06/2021   IRONPCTSAT 29 12/06/2021   Lab Results  Component Value Date   RETICCTPCT 3.8 (H) 12/06/2021   RBC 2.93 (L) 12/20/2021   Lab Results  Component Value Date   KPAFRELGTCHN 125.9 (H) 05/26/2020   LAMBDASER 76.6 (H) 05/26/2020   KAPLAMBRATIO 1.64 05/26/2020   Lab Results  Component Value Date   IGGSERUM 1,487 10/30/2021   Lab Results  Component Value Date   TOTALPROTELP 6.4 05/26/2020   ALBUMINELP 3.3 05/26/2020   A1GS 0.2 05/26/2020   A2GS 0.6 05/26/2020   BETS 0.7 05/26/2020   GAMS 1.5 05/26/2020   MSPIKE Not Observed 05/26/2020   SPEI Comment 05/26/2020     Chemistry      Component Value Date/Time   NA 141 12/20/2021 0927   K 5.3 (H) 12/20/2021 0927   CL 100 12/20/2021 0927   CO2 30 12/20/2021 0927   BUN 18 12/20/2021 0927   CREATININE 2.65 (H) 12/20/2021 0927   CREATININE 1.52 (H) 02/07/2015 1151      Component Value Date/Time   CALCIUM 9.0 12/20/2021 0927   ALKPHOS 99 12/20/2021 0927   AST 13 (L) 12/20/2021 0927   ALT 10  12/20/2021 0927   BILITOT 1.1 12/20/2021 0927       Impression and Plan: Mr. Bretado is a very pleasant 62 yo caucasian gentleman with recent pancytopenia on hemodialysis.  He received the max dose of Mircera once every 2 weeks.  His counts at this time are stable to improved. No transfusion needed at this time.  Follow-up in 6 weeks.    Lottie Dawson, NP 2/8/202310:08 AM

## 2021-12-21 DIAGNOSIS — D509 Iron deficiency anemia, unspecified: Secondary | ICD-10-CM | POA: Diagnosis not present

## 2021-12-21 DIAGNOSIS — Z992 Dependence on renal dialysis: Secondary | ICD-10-CM | POA: Diagnosis not present

## 2021-12-21 DIAGNOSIS — E0821 Diabetes mellitus due to underlying condition with diabetic nephropathy: Secondary | ICD-10-CM | POA: Diagnosis not present

## 2021-12-21 DIAGNOSIS — N186 End stage renal disease: Secondary | ICD-10-CM | POA: Diagnosis not present

## 2021-12-21 DIAGNOSIS — D631 Anemia in chronic kidney disease: Secondary | ICD-10-CM | POA: Diagnosis not present

## 2021-12-21 DIAGNOSIS — N2581 Secondary hyperparathyroidism of renal origin: Secondary | ICD-10-CM | POA: Diagnosis not present

## 2021-12-23 DIAGNOSIS — Z992 Dependence on renal dialysis: Secondary | ICD-10-CM | POA: Diagnosis not present

## 2021-12-23 DIAGNOSIS — D509 Iron deficiency anemia, unspecified: Secondary | ICD-10-CM | POA: Diagnosis not present

## 2021-12-23 DIAGNOSIS — N2581 Secondary hyperparathyroidism of renal origin: Secondary | ICD-10-CM | POA: Diagnosis not present

## 2021-12-23 DIAGNOSIS — E0821 Diabetes mellitus due to underlying condition with diabetic nephropathy: Secondary | ICD-10-CM | POA: Diagnosis not present

## 2021-12-23 DIAGNOSIS — N186 End stage renal disease: Secondary | ICD-10-CM | POA: Diagnosis not present

## 2021-12-23 DIAGNOSIS — D631 Anemia in chronic kidney disease: Secondary | ICD-10-CM | POA: Diagnosis not present

## 2021-12-25 ENCOUNTER — Other Ambulatory Visit: Payer: Medicare Other

## 2021-12-25 ENCOUNTER — Ambulatory Visit: Payer: Medicare Other | Admitting: Family

## 2021-12-26 ENCOUNTER — Telehealth: Payer: Self-pay | Admitting: *Deleted

## 2021-12-26 DIAGNOSIS — N186 End stage renal disease: Secondary | ICD-10-CM | POA: Diagnosis not present

## 2021-12-26 DIAGNOSIS — N2581 Secondary hyperparathyroidism of renal origin: Secondary | ICD-10-CM | POA: Diagnosis not present

## 2021-12-26 DIAGNOSIS — D631 Anemia in chronic kidney disease: Secondary | ICD-10-CM | POA: Diagnosis not present

## 2021-12-26 DIAGNOSIS — Z992 Dependence on renal dialysis: Secondary | ICD-10-CM | POA: Diagnosis not present

## 2021-12-26 DIAGNOSIS — D509 Iron deficiency anemia, unspecified: Secondary | ICD-10-CM | POA: Diagnosis not present

## 2021-12-26 DIAGNOSIS — E0821 Diabetes mellitus due to underlying condition with diabetic nephropathy: Secondary | ICD-10-CM | POA: Diagnosis not present

## 2021-12-26 NOTE — Telephone Encounter (Signed)
Per 12/20/21 los - called and lvm of upcoming appointments - request call back to confirm

## 2021-12-28 DIAGNOSIS — N2581 Secondary hyperparathyroidism of renal origin: Secondary | ICD-10-CM | POA: Diagnosis not present

## 2021-12-28 DIAGNOSIS — D631 Anemia in chronic kidney disease: Secondary | ICD-10-CM | POA: Diagnosis not present

## 2021-12-28 DIAGNOSIS — N186 End stage renal disease: Secondary | ICD-10-CM | POA: Diagnosis not present

## 2021-12-28 DIAGNOSIS — D509 Iron deficiency anemia, unspecified: Secondary | ICD-10-CM | POA: Diagnosis not present

## 2021-12-28 DIAGNOSIS — Z992 Dependence on renal dialysis: Secondary | ICD-10-CM | POA: Diagnosis not present

## 2021-12-28 DIAGNOSIS — E0821 Diabetes mellitus due to underlying condition with diabetic nephropathy: Secondary | ICD-10-CM | POA: Diagnosis not present

## 2021-12-30 DIAGNOSIS — N186 End stage renal disease: Secondary | ICD-10-CM | POA: Diagnosis not present

## 2021-12-30 DIAGNOSIS — Z992 Dependence on renal dialysis: Secondary | ICD-10-CM | POA: Diagnosis not present

## 2021-12-30 DIAGNOSIS — N2581 Secondary hyperparathyroidism of renal origin: Secondary | ICD-10-CM | POA: Diagnosis not present

## 2021-12-30 DIAGNOSIS — D509 Iron deficiency anemia, unspecified: Secondary | ICD-10-CM | POA: Diagnosis not present

## 2021-12-30 DIAGNOSIS — D631 Anemia in chronic kidney disease: Secondary | ICD-10-CM | POA: Diagnosis not present

## 2021-12-30 DIAGNOSIS — E0821 Diabetes mellitus due to underlying condition with diabetic nephropathy: Secondary | ICD-10-CM | POA: Diagnosis not present

## 2022-01-02 DIAGNOSIS — N2581 Secondary hyperparathyroidism of renal origin: Secondary | ICD-10-CM | POA: Diagnosis not present

## 2022-01-02 DIAGNOSIS — D509 Iron deficiency anemia, unspecified: Secondary | ICD-10-CM | POA: Diagnosis not present

## 2022-01-02 DIAGNOSIS — Z992 Dependence on renal dialysis: Secondary | ICD-10-CM | POA: Diagnosis not present

## 2022-01-02 DIAGNOSIS — E0821 Diabetes mellitus due to underlying condition with diabetic nephropathy: Secondary | ICD-10-CM | POA: Diagnosis not present

## 2022-01-02 DIAGNOSIS — D631 Anemia in chronic kidney disease: Secondary | ICD-10-CM | POA: Diagnosis not present

## 2022-01-02 DIAGNOSIS — N186 End stage renal disease: Secondary | ICD-10-CM | POA: Diagnosis not present

## 2022-01-03 DIAGNOSIS — I871 Compression of vein: Secondary | ICD-10-CM | POA: Diagnosis not present

## 2022-01-03 DIAGNOSIS — N186 End stage renal disease: Secondary | ICD-10-CM | POA: Diagnosis not present

## 2022-01-03 DIAGNOSIS — T82858A Stenosis of vascular prosthetic devices, implants and grafts, initial encounter: Secondary | ICD-10-CM | POA: Diagnosis not present

## 2022-01-03 DIAGNOSIS — Z992 Dependence on renal dialysis: Secondary | ICD-10-CM | POA: Diagnosis not present

## 2022-01-04 DIAGNOSIS — Z992 Dependence on renal dialysis: Secondary | ICD-10-CM | POA: Diagnosis not present

## 2022-01-04 DIAGNOSIS — D631 Anemia in chronic kidney disease: Secondary | ICD-10-CM | POA: Diagnosis not present

## 2022-01-04 DIAGNOSIS — E0821 Diabetes mellitus due to underlying condition with diabetic nephropathy: Secondary | ICD-10-CM | POA: Diagnosis not present

## 2022-01-04 DIAGNOSIS — N186 End stage renal disease: Secondary | ICD-10-CM | POA: Diagnosis not present

## 2022-01-04 DIAGNOSIS — D509 Iron deficiency anemia, unspecified: Secondary | ICD-10-CM | POA: Diagnosis not present

## 2022-01-04 DIAGNOSIS — N2581 Secondary hyperparathyroidism of renal origin: Secondary | ICD-10-CM | POA: Diagnosis not present

## 2022-01-06 DIAGNOSIS — N2581 Secondary hyperparathyroidism of renal origin: Secondary | ICD-10-CM | POA: Diagnosis not present

## 2022-01-06 DIAGNOSIS — N186 End stage renal disease: Secondary | ICD-10-CM | POA: Diagnosis not present

## 2022-01-06 DIAGNOSIS — D631 Anemia in chronic kidney disease: Secondary | ICD-10-CM | POA: Diagnosis not present

## 2022-01-06 DIAGNOSIS — E0821 Diabetes mellitus due to underlying condition with diabetic nephropathy: Secondary | ICD-10-CM | POA: Diagnosis not present

## 2022-01-06 DIAGNOSIS — Z992 Dependence on renal dialysis: Secondary | ICD-10-CM | POA: Diagnosis not present

## 2022-01-06 DIAGNOSIS — D509 Iron deficiency anemia, unspecified: Secondary | ICD-10-CM | POA: Diagnosis not present

## 2022-01-08 ENCOUNTER — Ambulatory Visit: Payer: Medicare Other | Admitting: Orthopedic Surgery

## 2022-01-09 ENCOUNTER — Ambulatory Visit: Payer: Medicare Other | Admitting: Internal Medicine

## 2022-01-09 DIAGNOSIS — N186 End stage renal disease: Secondary | ICD-10-CM | POA: Diagnosis not present

## 2022-01-09 DIAGNOSIS — N2581 Secondary hyperparathyroidism of renal origin: Secondary | ICD-10-CM | POA: Diagnosis not present

## 2022-01-09 DIAGNOSIS — N179 Acute kidney failure, unspecified: Secondary | ICD-10-CM | POA: Diagnosis not present

## 2022-01-09 DIAGNOSIS — Z992 Dependence on renal dialysis: Secondary | ICD-10-CM | POA: Diagnosis not present

## 2022-01-09 DIAGNOSIS — D631 Anemia in chronic kidney disease: Secondary | ICD-10-CM | POA: Diagnosis not present

## 2022-01-09 DIAGNOSIS — D509 Iron deficiency anemia, unspecified: Secondary | ICD-10-CM | POA: Diagnosis not present

## 2022-01-09 DIAGNOSIS — E0821 Diabetes mellitus due to underlying condition with diabetic nephropathy: Secondary | ICD-10-CM | POA: Diagnosis not present

## 2022-01-10 ENCOUNTER — Ambulatory Visit (INDEPENDENT_AMBULATORY_CARE_PROVIDER_SITE_OTHER): Payer: Medicare Other | Admitting: Family

## 2022-01-10 ENCOUNTER — Encounter: Payer: Self-pay | Admitting: Family

## 2022-01-10 ENCOUNTER — Ambulatory Visit: Payer: Medicare Other | Admitting: Internal Medicine

## 2022-01-10 DIAGNOSIS — Z89511 Acquired absence of right leg below knee: Secondary | ICD-10-CM

## 2022-01-10 DIAGNOSIS — I872 Venous insufficiency (chronic) (peripheral): Secondary | ICD-10-CM | POA: Diagnosis not present

## 2022-01-10 DIAGNOSIS — Z89432 Acquired absence of left foot: Secondary | ICD-10-CM

## 2022-01-10 DIAGNOSIS — L97909 Non-pressure chronic ulcer of unspecified part of unspecified lower leg with unspecified severity: Secondary | ICD-10-CM | POA: Diagnosis not present

## 2022-01-10 NOTE — Progress Notes (Signed)
Office Visit Note   Patient: Alexander [name]           Date of Birth: Oct 03, 1961           MRN: [id number] Visit Date: 01/10/2022              Requested by: [name], MD [address] [place],  [place] [id number] PCP: [name], [name], MD  Chief Complaint  Patient presents with   Left Foot - Wound Check   Right Foot - Wound Check      HPI: The patient is a 61 year old gentleman with a past medical history of end-stage renal disease, diabetes type 2.  As well as congestive heart failure.  He is status post right below-knee amputation and left transmetatarsal amputation.    He presents today concerned for weeping edema and ulcerations to his bilateral lower extremities.  They are unable to give a good history of as to how long he has been having worsening of his lower extremities.  Assessment & Plan: Visit Diagnoses: No diagnosis found.  Plan: Concern for ischemic changes and significant edema with weeping to bilateral lower extremities.  Placed in an [place] and Dynaflex compression wraps bilateral we will follow him closely.  He will follow-up on Monday with [name].  Discussed elevating and sleeping lying flat to elevate the lower extremities  Follow-Up Instructions: Return in about 5 days (around 01/15/2022).   Ortho Exam  Patient is alert, oriented, no adenopathy, well-dressed, normal affect, normal respiratory effort. On examination bilateral lower extremities he does have significant weeping edema with induration into his thighs.  Please see attached photos.  The right residual limb has distal maceration from weeping.  There is ecchymosis and peeling skin.  There is no bleeding no purulence no foul odor.  To the left lower extremity his transmetatarsal amputation also has significant maceration with open ulceration.  Ulcer is filled in with fibrinous exudative tissue there is surrounding ecchymosis.  Concern for ischemic changes to bilateral lower  extremities  Imaging: No results found.       Labs: Lab Results  Component Value Date   HGBA1C 6.8 (H) 10/29/2021   HGBA1C 5.5 05/23/2020   HGBA1C 8.6 (H) 01/27/2020   REPTSTATUS 11/01/2021 FINAL 10/27/2021   GRAMSTAIN  01/26/2020    RARE WBC PRESENT,BOTH PMN AND MONONUCLEAR FEW GRAM POSITIVE COCCI IN PAIRS    CULT  10/27/2021    NO GROWTH 5 DAYS Performed at [facility], [address] [place], [place] [address]    LABORGA STREPTOCOCCUS MITIS/ORALIS 01/26/2020     Lab Results  Component Value Date   ALBUMIN 2.9 (L) 12/20/2021   ALBUMIN 3.0 (L) 12/06/2021   ALBUMIN 1.8 (L) 11/06/2021    Lab Results  Component Value Date   MG 1.9 07/19/2020   MG 1.8 05/25/2020   MG 2.0 10/07/2014   No results found for: VD25OH  No results found for: PREALBUMIN CBC EXTENDED Latest Ref Rng & Units 12/20/2021 12/06/2021 12/06/2021  WBC 4.0 - 10.5 K/uL 10.0 9.9 -  RBC 4.22 - 5.81 MIL/uL 2.93(L) 2.56(L) 2.57(L)  HGB 13.0 - 17.0 g/dL 9.0(L) 7.6(L) -  HCT 39.0 - 52.0 % 30.9(L) 25.8(L) -  PLT 150 - 400 K/uL 117(L) 120(L) -  NEUTROABS 1.7 - 7.7 K/uL 6.8 6.5 -  LYMPHSABS 0.7 - 4.0 K/uL 2.2 2.5 -     There is no height or weight on file to calculate BMI.  Orders:  No orders  of the defined types were placed in this encounter.  No orders of the defined types were placed in this encounter.    Procedures: No procedures performed  Clinical Data: No additional findings.  ROS:  All other systems negative, except as noted in the HPI. Review of Systems  Objective: Vital Signs: There were no vitals taken for this visit.  Specialty Comments:  No specialty comments available.  PMFS History: Patient Active Problem List   Diagnosis Date Noted   Blister 11/08/2021   Malnutrition of moderate degree 11/02/2021   Pacemaker infection ([place])    Transaminitis    Tricuspid valve vegetation    Thrombocytopenia ([place]) 10/26/2021   Hyponatremia 10/26/2021   Acute on chronic  diastolic CHF (congestive heart failure) ([place]) 10/26/2021   Atrial fibrillation with RVR ([place]) 10/25/2021   Symptomatic anemia 09/30/2021   ESRD (end stage renal disease) ([place]) 09/30/2021   PAF (paroxysmal atrial fibrillation) ([place]) 09/30/2021   NSVT (nonsustained ventricular tachycardia)    Pancytopenia (HCC)    Acute blood loss anemia 07/18/2020   Hemodialysis patient ([place]) 06/04/2020   Necrotizing glomerulonephritis 06/04/2020   Heme positive stool    Melena    Upper GI bleeding 05/23/2020   Acute GI bleeding 05/23/2020   Acute hematogenous osteomyelitis of right foot ([place]) 03/11/2020   Gangrene of right foot ([place])    Diabetic foot ulcer associated with type 2 diabetes mellitus ([place]) 02/19/2020   Heel ulcer ([place]) 11/05/2019   Atherosclerotic PVD with ulceration ([place]) 09/09/2017   Essential hypertension, benign 02/07/2015   Renal insufficiency 02/07/2015   Post PTCA 11/09/2014   S/P PTCA (percutaneous transluminal coronary angioplasty) 11/09/2014   Pacemaker: [place] DR MRI [id number] Dual chamber pacemaker 10/08/2014 10/08/2014   Encounter for care of pacemaker 10/08/2014   Sinus node dysfunction (HCC) 10/08/2014   Cardiac asystole ([place]) 10/07/2014   Cardiac syncope 10/07/2014   Diabetes mellitus with stage 3 chronic kidney disease ([place]) 10/07/2014   CAD (coronary artery disease), native coronary artery 10/07/2014   Diabetes mellitus due to underlying condition without complications ([place]) 97/67/3419   Anemia, unspecified 05/25/2014   Syncope 04/30/2014   Atherosclerosis of native arteries of the extremities with gangrene ([place]) 12/16/2013   Volume overload 10/30/2013   Acute renal failure ([place]) 10/28/2013   PVD (peripheral vascular disease) ([place]) 10/27/2013   Leukocytosis 10/21/2013   Anemia 10/21/2013   Osteomyelitis of left foot ([place]) 08/19/2013   Spinal stenosis, lumbar region, with neurogenic claudication 06/12/2012    Class: Diagnosis of   Diabetes mellitus with  end stage renal disease ([place]) 03/15/2009   ALCOHOL ABUSE 03/15/2009   TOBACCO USE 03/15/2009   HYPERTENSION 03/15/2009   Past Medical History:  Diagnosis Date   Acute blood loss anemia    Alcohol abuse    Anemia    Anemia of chronic disease    Arthritis    "patient does not think so."   Atrial fibrillation ([place])    Cardiac syncope 10/07/2014   rapid A fib with 8 sec pauses on converison with syncope- temp pacing wire placed then PPM   Cataract    BILATERAL   Coronary artery disease    Diabetes mellitus    dx---been  awhile   Elevated LFTs    Endocarditis of tricuspid valve    ESRD (end stage renal disease) ([place])    [place]   GERD (gastroesophageal reflux disease)    History of blood transfusion    Hyperlipemia    Hypertension  Malnutrition ([place])    Osteomyelitis (HCC)    right foot   Peripheral vascular disease (HCC)    Presence of permanent cardiac pacemaker 10/08/2014   Medtronic   Tricuspid valve vegetation     Family History  Problem Relation Age of Onset   Diabetes type II Mother    Hypertension Mother    Diabetes Mother    Liver cancer Father    Diabetes type II Sister    Breast cancer Sister    Diabetes Sister    Hypertension Sister    Diabetes type II Brother    Kidney failure Brother    Diabetes Brother    Hypertension Brother    Diabetes type II Sister     Past Surgical History:  Procedure Laterality Date   A/V FISTULAGRAM Right 05/01/2021   Procedure: A/V FISTULAGRAM;  Surgeon: Waynetta Sandy, MD;  Location: [facility];  Service: Cardiovascular;  Laterality: Right;  failed fistulagram   ABDOMINAL AORTOGRAM W/LOWER EXTREMITY N/A 09/09/2017   Procedure: ABDOMINAL AORTOGRAM W/LOWER EXTREMITY;  Surgeon: Angelia Mould, MD;  Location: [facility];  Service: Cardiovascular;  Laterality: N/A;   ABDOMINAL AORTOGRAM W/LOWER EXTREMITY Right 11/11/2019   Procedure: ABDOMINAL AORTOGRAM W/LOWER EXTREMITY;   Surgeon: Marty Heck, MD;  Location: [facility];  Service: Cardiovascular;  Laterality: Right;   AMPUTATION Left 08/19/2013   Procedure: AMPUTATION RAY;  Surgeon: Alta Corning, MD;  Location: [facility];  Service: Orthopedics;  Laterality: Left;  ray amputation left 5th   AMPUTATION Left 10/27/2013   Procedure: AMPUTATION DIGIT-LEFT 4TH TOE, 4th and 5th metatarsal.;  Surgeon: Angelia Mould, MD;  Location: [facility];  Service: Vascular;  Laterality: Left;   AMPUTATION Left 11/04/2013   Procedure: LEFT FOOT TRANS-METATARSAL AMPUTATION WITH WOUND CLOSURE ;  Surgeon: Alta Corning, MD;  Location: [facility];  Service: Orthopedics;  Laterality: Left;   AMPUTATION Right 09/10/2017   Procedure: RIGHT FOURTH AND FIFTH TOE AMPUTATION;  Surgeon: Angelia Mould, MD;  Location: [facility];  Service: Vascular;  Laterality: Right;   AMPUTATION Right 02/19/2020   Procedure: AMPUTATION RIGHT 3RD TOE;  Surgeon: Angelia Mould, MD;  Location: [facility];  Service: Vascular;  Laterality: Right;   AMPUTATION Right 03/11/2020   Procedure: RIGHT BELOW KNEE AMPUTATION;  Surgeon: Newt Minion, MD;  Location: [facility];  Service: Orthopedics;  Laterality: Right;   APPLICATION OF WOUND VAC Right 02/19/2020   Procedure: Application Of Wound Vac Right foot;  Surgeon: Angelia Mould, MD;  Location: [facility];  Service: Vascular;  Laterality: Right;   AV FISTULA PLACEMENT Right 03/27/2021   Procedure: RIGHT ARM ARTERIOVENOUS (AV) FISTULA;  Surgeon: Angelia Mould, MD;  Location: [facility];  Service: Vascular;  Laterality: Right;   AV FISTULA PLACEMENT Right 05/10/2021   Procedure: RIGHT ARM ARTERIOVENOUS (AV) FISTULA;  Surgeon: Waynetta Sandy, MD;  Location: [facility];  Service: Vascular;  Laterality: Right;   [place] Right 08/11/2021   Procedure: RIGHT ARM SECOND STAGE BASILIC VEIN TRANSPOSITION;  Surgeon: Waynetta Sandy, MD;  Location: [facility];   Service: Vascular;  Laterality: Right;   BIOPSY  07/20/2020   Procedure: BIOPSY;  Surgeon: Irving Copas., MD;  Location: [facility];  Service: Gastroenterology;;   CARDIAC CATHETERIZATION  10/07/2014   Procedure: LEFT HEART CATH AND CORONARY ANGIOGRAPHY;  Surgeon: Laverda Page, MD;  Location: [facility] LAB;  Service: Cardiovascular;;  COLONOSCOPY W/ POLYPECTOMY     COLONOSCOPY WITH PROPOFOL N/A 05/25/2020   Procedure: COLONOSCOPY WITH PROPOFOL;  Surgeon: Gatha Mayer, MD;  Location: [facility];  Service: Endoscopy;  Laterality: N/A;   EMBOLECTOMY Right 09/12/2017   Procedure: Thrombectomy  & Redo Right Below Knee Popliteal Artery Bypass Graft  ;  Surgeon: Waynetta Sandy, MD;  Location: [facility];  Service: Vascular;  Laterality: Right;   ENTEROSCOPY N/A 07/20/2020   Procedure: ENTEROSCOPY;  Surgeon: Rush Landmark Telford Nab., MD;  Location: [facility];  Service: Gastroenterology;  Laterality: N/A;   ESOPHAGOGASTRODUODENOSCOPY (EGD) WITH PROPOFOL N/A 05/24/2020   Procedure: ESOPHAGOGASTRODUODENOSCOPY (EGD) WITH PROPOFOL;  Surgeon: Ladene Artist, MD;  Location: [facility];  Service: Endoscopy;  Laterality: N/A;   EYE SURGERY Bilateral    cataract   FEMORAL-TIBIAL BYPASS GRAFT Left 10/27/2013   Procedure: BYPASS GRAFT LEFT FEMORAL- POSTERIOR TIBIAL ARTERY;  Surgeon: Angelia Mould, MD;  Location: [facility];  Service: Vascular;  Laterality: Left;   FEMORAL-TIBIAL BYPASS GRAFT Right 09/10/2017   Procedure: RIGHT SUPERFICIAL  FEMORAL ARTERY-BELOW KNEE POPLITEAL ARTERY BYPASS GRAFT WITH VEIN;  Surgeon: Angelia Mould, MD;  Location: [facility];  Service: Vascular;  Laterality: Right;   GIVENS CAPSULE STUDY N/A 07/20/2020   Procedure: GIVENS CAPSULE STUDY;  Surgeon: Irving Copas., MD;  Location: [facility];  Service: Gastroenterology;  Laterality: N/A;  capsule placed through scope into duodenum @ 0936   I & D EXTREMITY Left 10/27/2013   Procedure:  IRRIGATION AND DEBRIDEMENT EXTREMITY- LEFT FOOT;  Surgeon: Angelia Mould, MD;  Location: [place];  Service: Vascular;  Laterality: Left;   I & D EXTREMITY Right 01/26/2020   Procedure: IRRIGATION AND DEBRIDEMENT EXTREMITY RIGHT FOOT WOUND;  Surgeon: Angelia Mould, MD;  Location: [facility];  Service: Vascular;  Laterality: Right;   INSERT / REPLACE / REMOVE PACEMAKER     INSERTION OF DIALYSIS CATHETER Right 11/03/2021   Procedure: RIGHT INTERNAL JUGULAR INSERTION OF TUNNELED DIALYSIS CATHETER;  Surgeon: Angelia Mould, MD;  Location: [facility];  Service: Vascular;  Laterality: Right;   INTRAOPERATIVE ARTERIOGRAM Right 09/10/2017   Procedure: INTRA OPERATIVE ARTERIOGRAM;  Surgeon: Angelia Mould, MD;  Location: [facility];  Service: Vascular;  Laterality: Right;   IR ANGIOGRAM FOLLOW UP STUDY  09/09/2017   IR FLUORO GUIDE CV LINE RIGHT  05/31/2020   IR US GUIDE VASC ACCESS RIGHT  06/01/2020   LEFT HEART CATH AND CORONARY ANGIOGRAPHY N/A 08/19/2020   Procedure: LEFT HEART CATH AND CORONARY ANGIOGRAPHY;  Surgeon: Adrian Prows, MD;  Location: [facility];  Service: Cardiovascular;  Laterality: N/A;   LEFT HEART CATHETERIZATION WITH CORONARY ANGIOGRAM N/A 11/09/2014   Procedure: LEFT HEART CATHETERIZATION WITH CORONARY ANGIOGRAM;  Surgeon: Laverda Page, MD;  Location: [facility] LAB;  Service: Cardiovascular;  Laterality: N/A;   LOWER EXTREMITY ANGIOGRAM Right 11/08/2015   Procedure: Lower Extremity Angiogram;  Surgeon: Serafina Mitchell, MD;  Location: [facility];  Service: Cardiovascular;  Laterality: Right;   LUMBAR LAMINECTOMY  06/13/2012   Procedure: MICRODISCECTOMY LUMBAR LAMINECTOMY;  Surgeon: Jessy Oto, MD;  Location: [facility];  Service: Orthopedics;  Laterality: N/A;  Central laminectomy L2-3, L3-4, L4-5   PACEMAKER INSERTION  10/08/2014   MDT Advisa MRI compatible dual chamber pacemaker implanted by Dr Caryl Comes for syncope with post-termination pauses    PERCUTANEOUS CORONARY STENT INTERVENTION (PCI-S)  11/09/2014   des to lad & distal circumflex  Dr  [name]   PERIPHERAL VASCULAR BALLOON ANGIOPLASTY Right 11/11/2019   Procedure: PERIPHERAL VASCULAR BALLOON ANGIOPLASTY;  Surgeon: Marty Heck, MD;  Location: [facility];  Service: Cardiovascular;  Laterality: Right;  below knee popliteal, tibioperoneal trunk, posterior tibial arteries   PERIPHERAL VASCULAR CATHETERIZATION N/A 11/08/2015   Procedure: Abdominal Aortogram;  Surgeon: Serafina Mitchell, MD;  Location: [facility];  Service: Cardiovascular;  Laterality: N/A;   PERIPHERAL VASCULAR CATHETERIZATION Right 11/08/2015   Procedure: Peripheral Vascular Atherectomy;  Surgeon: Serafina Mitchell, MD;  Location: [facility];  Service: Cardiovascular;  Laterality: Right;   PERIPHERAL VASCULAR CATHETERIZATION N/A 10/10/2016   Procedure: Lower Extremity Angiography;  Surgeon: Waynetta Sandy, MD;  Location: [facility];  Service: Cardiovascular;  Laterality: N/A;   PERIPHERAL VASCULAR CATHETERIZATION Right 10/10/2016   Procedure: Peripheral Vascular Atherectomy;  Surgeon: Waynetta Sandy, MD;  Location: [facility];  Service: Cardiovascular;  Laterality: Right;  Popliteal   PERMANENT PACEMAKER INSERTION N/A 10/08/2014   Procedure: PERMANENT PACEMAKER INSERTION;  Surgeon: Deboraha Sprang, MD;  Location: [facility] LAB;  Service: Cardiovascular;  Laterality: N/A;   SUBMUCOSAL TATTOO INJECTION  07/20/2020   Procedure: SUBMUCOSAL TATTOO INJECTION;  Surgeon: Irving Copas., MD;  Location: [facility];  Service: Gastroenterology;;   TEE WITHOUT CARDIOVERSION N/A 10/27/2021   Procedure: TRANSESOPHAGEAL ECHOCARDIOGRAM (TEE);  Surgeon: Adrian Prows, MD;  Location: [facility] ENDOSCOPY;  Service: Cardiovascular;  Laterality: N/A;   TEMPORARY PACEMAKER INSERTION Bilateral 10/07/2014   Procedure: TEMPORARY PACEMAKER INSERTION;  Surgeon: Laverda Page,  MD;  Location: [facility] CATH LAB;  Service: Cardiovascular;  Laterality: Bilateral;   WOUND DEBRIDEMENT Right 02/19/2020   Procedure: DEBRIDEMENT WOUND RIGHT FOOT;  Surgeon: Angelia Mould, MD;  Location: [facility];  Service: Vascular;  Laterality: Right;   Social History   Occupational History   Occupation: Disabled  Tobacco Use   Smoking status: Former    Packs/day: 1.50    Years: 30.00    Pack years: 45.00    Types: Cigarettes    Quit date: 10/07/2014    Years since quitting: 7.2   Smokeless tobacco: Never   Tobacco comments:    Does an occasional cigar  Vaping Use   Vaping Use: Never used  Substance and Sexual Activity   Alcohol use: Yes    Alcohol/week: 21.0 standard drinks    Types: 21 Glasses of wine per week    Comment: cut way back, none in the last month   Drug use: No   Sexual activity: Not on file

## 2022-01-11 ENCOUNTER — Other Ambulatory Visit: Payer: Self-pay | Admitting: Orthopedic Surgery

## 2022-01-11 ENCOUNTER — Telehealth: Payer: Self-pay | Admitting: Orthopedic Surgery

## 2022-01-11 DIAGNOSIS — Z992 Dependence on renal dialysis: Secondary | ICD-10-CM | POA: Diagnosis not present

## 2022-01-11 DIAGNOSIS — D631 Anemia in chronic kidney disease: Secondary | ICD-10-CM | POA: Diagnosis not present

## 2022-01-11 DIAGNOSIS — N2581 Secondary hyperparathyroidism of renal origin: Secondary | ICD-10-CM | POA: Diagnosis not present

## 2022-01-11 DIAGNOSIS — N186 End stage renal disease: Secondary | ICD-10-CM | POA: Diagnosis not present

## 2022-01-11 DIAGNOSIS — E0821 Diabetes mellitus due to underlying condition with diabetic nephropathy: Secondary | ICD-10-CM | POA: Diagnosis not present

## 2022-01-11 MED ORDER — OXYCODONE-ACETAMINOPHEN 5-325 MG PO TABS: 1.0000 | ORAL_TABLET | Freq: Three times a day (TID) | ORAL | 0 refills | Status: DC | PRN

## 2022-01-11 NOTE — Telephone Encounter (Signed)
Pt's daughter  [name] called requesting a refill of oxycodone for pains. Pt had an appt with PA Junie Panning and [name] stated PA Junie Panning was to send in pain meds to pharmacy on file. Please call pt when meds have been sent in. Phone number is [phone]. ?

## 2022-01-11 NOTE — Telephone Encounter (Signed)
Called to notify pt of rx sent. No answer and no VM set up. ?

## 2022-01-11 NOTE — Telephone Encounter (Signed)
Pt came in yesterday for bilateral legs. He is having ischemic changes to BLE. Hx of Right BKA and Left TMA. He was put in unna and dynaflex wraps. He does f/u with you on Monday. His last refill of oxycodone was on 11/06/21 ?

## 2022-01-13 ENCOUNTER — Encounter (HOSPITAL_COMMUNITY): Payer: Self-pay | Admitting: Emergency Medicine

## 2022-01-13 ENCOUNTER — Inpatient Hospital Stay (HOSPITAL_COMMUNITY)
Admission: EM | Admit: 2022-01-13 | Discharge: 2022-02-14 | DRG: 474 | Disposition: A | Discharge status: Skilled Nursing Facility | Payer: Medicare Other | Attending: Internal Medicine | Admitting: Internal Medicine

## 2022-01-13 ENCOUNTER — Other Ambulatory Visit: Payer: Self-pay

## 2022-01-13 ENCOUNTER — Emergency Department (HOSPITAL_COMMUNITY): Payer: Medicare Other

## 2022-01-13 DIAGNOSIS — T8781 Dehiscence of amputation stump: Secondary | ICD-10-CM

## 2022-01-13 DIAGNOSIS — I132 Hypertensive heart and chronic kidney disease with heart failure and with stage 5 chronic kidney disease, or end stage renal disease: Secondary | ICD-10-CM | POA: Diagnosis present

## 2022-01-13 DIAGNOSIS — I4821 Permanent atrial fibrillation: Secondary | ICD-10-CM | POA: Diagnosis not present

## 2022-01-13 DIAGNOSIS — Z7189 Other specified counseling: Secondary | ICD-10-CM

## 2022-01-13 DIAGNOSIS — R279 Unspecified lack of coordination: Secondary | ICD-10-CM | POA: Diagnosis not present

## 2022-01-13 DIAGNOSIS — I5022 Chronic systolic (congestive) heart failure: Secondary | ICD-10-CM | POA: Diagnosis present

## 2022-01-13 DIAGNOSIS — I70209 Unspecified atherosclerosis of native arteries of extremities, unspecified extremity: Secondary | ICD-10-CM | POA: Diagnosis not present

## 2022-01-13 DIAGNOSIS — L89213 Pressure ulcer of right hip, stage 3: Secondary | ICD-10-CM | POA: Diagnosis present

## 2022-01-13 DIAGNOSIS — T8753 Necrosis of amputation stump, right lower extremity: Secondary | ICD-10-CM | POA: Diagnosis not present

## 2022-01-13 DIAGNOSIS — T827XXD Infection and inflammatory reaction due to other cardiac and vascular devices, implants and grafts, subsequent encounter: Secondary | ICD-10-CM | POA: Diagnosis not present

## 2022-01-13 DIAGNOSIS — M5440 Lumbago with sciatica, unspecified side: Secondary | ICD-10-CM

## 2022-01-13 DIAGNOSIS — Z20822 Contact with and (suspected) exposure to covid-19: Secondary | ICD-10-CM | POA: Diagnosis present

## 2022-01-13 DIAGNOSIS — Z043 Encounter for examination and observation following other accident: Secondary | ICD-10-CM | POA: Diagnosis not present

## 2022-01-13 DIAGNOSIS — E785 Hyperlipidemia, unspecified: Secondary | ICD-10-CM | POA: Diagnosis present

## 2022-01-13 DIAGNOSIS — I2510 Atherosclerotic heart disease of native coronary artery without angina pectoris: Secondary | ICD-10-CM | POA: Diagnosis present

## 2022-01-13 DIAGNOSIS — Z955 Presence of coronary angioplasty implant and graft: Secondary | ICD-10-CM

## 2022-01-13 DIAGNOSIS — I330 Acute and subacute infective endocarditis: Secondary | ICD-10-CM | POA: Diagnosis present

## 2022-01-13 DIAGNOSIS — I96 Gangrene, not elsewhere classified: Secondary | ICD-10-CM | POA: Diagnosis not present

## 2022-01-13 DIAGNOSIS — Y92481 Parking lot as the place of occurrence of the external cause: Secondary | ICD-10-CM | POA: Diagnosis not present

## 2022-01-13 DIAGNOSIS — I272 Pulmonary hypertension, unspecified: Secondary | ICD-10-CM | POA: Diagnosis not present

## 2022-01-13 DIAGNOSIS — S32039A Unspecified fracture of third lumbar vertebra, initial encounter for closed fracture: Secondary | ICD-10-CM | POA: Diagnosis present

## 2022-01-13 DIAGNOSIS — Z89511 Acquired absence of right leg below knee: Secondary | ICD-10-CM | POA: Diagnosis not present

## 2022-01-13 DIAGNOSIS — Z841 Family history of disorders of kidney and ureter: Secondary | ICD-10-CM

## 2022-01-13 DIAGNOSIS — W050XXA Fall from non-moving wheelchair, initial encounter: Secondary | ICD-10-CM | POA: Diagnosis present

## 2022-01-13 DIAGNOSIS — E1151 Type 2 diabetes mellitus with diabetic peripheral angiopathy without gangrene: Secondary | ICD-10-CM | POA: Diagnosis not present

## 2022-01-13 DIAGNOSIS — Z7901 Long term (current) use of anticoagulants: Secondary | ICD-10-CM

## 2022-01-13 DIAGNOSIS — E1152 Type 2 diabetes mellitus with diabetic peripheral angiopathy with gangrene: Secondary | ICD-10-CM | POA: Diagnosis not present

## 2022-01-13 DIAGNOSIS — R2689 Other abnormalities of gait and mobility: Secondary | ICD-10-CM | POA: Diagnosis not present

## 2022-01-13 DIAGNOSIS — S88111A Complete traumatic amputation at level between knee and ankle, right lower leg, initial encounter: Secondary | ICD-10-CM

## 2022-01-13 DIAGNOSIS — I4891 Unspecified atrial fibrillation: Secondary | ICD-10-CM | POA: Diagnosis not present

## 2022-01-13 DIAGNOSIS — M4626 Osteomyelitis of vertebra, lumbar region: Secondary | ICD-10-CM | POA: Diagnosis not present

## 2022-01-13 DIAGNOSIS — I10 Essential (primary) hypertension: Secondary | ICD-10-CM

## 2022-01-13 DIAGNOSIS — Z515 Encounter for palliative care: Secondary | ICD-10-CM | POA: Diagnosis not present

## 2022-01-13 DIAGNOSIS — K219 Gastro-esophageal reflux disease without esophagitis: Secondary | ICD-10-CM | POA: Diagnosis present

## 2022-01-13 DIAGNOSIS — I130 Hypertensive heart and chronic kidney disease with heart failure and stage 1 through stage 4 chronic kidney disease, or unspecified chronic kidney disease: Secondary | ICD-10-CM | POA: Diagnosis not present

## 2022-01-13 DIAGNOSIS — N2581 Secondary hyperparathyroidism of renal origin: Secondary | ICD-10-CM | POA: Diagnosis not present

## 2022-01-13 DIAGNOSIS — N185 Chronic kidney disease, stage 5: Secondary | ICD-10-CM | POA: Diagnosis not present

## 2022-01-13 DIAGNOSIS — N186 End stage renal disease: Secondary | ICD-10-CM

## 2022-01-13 DIAGNOSIS — Z794 Long term (current) use of insulin: Secondary | ICD-10-CM | POA: Diagnosis not present

## 2022-01-13 DIAGNOSIS — M4646 Discitis, unspecified, lumbar region: Secondary | ICD-10-CM | POA: Diagnosis not present

## 2022-01-13 DIAGNOSIS — I38 Endocarditis, valve unspecified: Secondary | ICD-10-CM

## 2022-01-13 DIAGNOSIS — R262 Difficulty in walking, not elsewhere classified: Secondary | ICD-10-CM | POA: Diagnosis not present

## 2022-01-13 DIAGNOSIS — Y831 Surgical operation with implant of artificial internal device as the cause of abnormal reaction of the patient, or of later complication, without mention of misadventure at the time of the procedure: Secondary | ICD-10-CM | POA: Diagnosis present

## 2022-01-13 DIAGNOSIS — L89152 Pressure ulcer of sacral region, stage 2: Secondary | ICD-10-CM | POA: Diagnosis present

## 2022-01-13 DIAGNOSIS — M4807 Spinal stenosis, lumbosacral region: Secondary | ICD-10-CM | POA: Diagnosis present

## 2022-01-13 DIAGNOSIS — I120 Hypertensive chronic kidney disease with stage 5 chronic kidney disease or end stage renal disease: Secondary | ICD-10-CM | POA: Diagnosis not present

## 2022-01-13 DIAGNOSIS — Z89432 Acquired absence of left foot: Secondary | ICD-10-CM

## 2022-01-13 DIAGNOSIS — Z888 Allergy status to other drugs, medicaments and biological substances status: Secondary | ICD-10-CM

## 2022-01-13 DIAGNOSIS — I428 Other cardiomyopathies: Secondary | ICD-10-CM | POA: Diagnosis present

## 2022-01-13 DIAGNOSIS — E1122 Type 2 diabetes mellitus with diabetic chronic kidney disease: Secondary | ICD-10-CM | POA: Diagnosis not present

## 2022-01-13 DIAGNOSIS — E43 Unspecified severe protein-calorie malnutrition: Secondary | ICD-10-CM | POA: Diagnosis not present

## 2022-01-13 DIAGNOSIS — E7800 Pure hypercholesterolemia, unspecified: Secondary | ICD-10-CM | POA: Diagnosis not present

## 2022-01-13 DIAGNOSIS — M6281 Muscle weakness (generalized): Secondary | ICD-10-CM | POA: Diagnosis not present

## 2022-01-13 DIAGNOSIS — L97922 Non-pressure chronic ulcer of unspecified part of left lower leg with fat layer exposed: Secondary | ICD-10-CM | POA: Diagnosis not present

## 2022-01-13 DIAGNOSIS — E0821 Diabetes mellitus due to underlying condition with diabetic nephropathy: Secondary | ICD-10-CM | POA: Diagnosis not present

## 2022-01-13 DIAGNOSIS — Z800 Family history of malignant neoplasm of digestive organs: Secondary | ICD-10-CM

## 2022-01-13 DIAGNOSIS — L97421 Non-pressure chronic ulcer of left heel and midfoot limited to breakdown of skin: Secondary | ICD-10-CM | POA: Diagnosis present

## 2022-01-13 DIAGNOSIS — K089 Disorder of teeth and supporting structures, unspecified: Secondary | ICD-10-CM | POA: Diagnosis present

## 2022-01-13 DIAGNOSIS — Z992 Dependence on renal dialysis: Secondary | ICD-10-CM | POA: Diagnosis not present

## 2022-01-13 DIAGNOSIS — S32029A Unspecified fracture of second lumbar vertebra, initial encounter for closed fracture: Secondary | ICD-10-CM | POA: Diagnosis present

## 2022-01-13 DIAGNOSIS — R1314 Dysphagia, pharyngoesophageal phase: Secondary | ICD-10-CM | POA: Diagnosis not present

## 2022-01-13 DIAGNOSIS — I70261 Atherosclerosis of native arteries of extremities with gangrene, right leg: Secondary | ICD-10-CM | POA: Diagnosis not present

## 2022-01-13 DIAGNOSIS — Z950 Presence of cardiac pacemaker: Secondary | ICD-10-CM | POA: Diagnosis not present

## 2022-01-13 DIAGNOSIS — M5126 Other intervertebral disc displacement, lumbar region: Secondary | ICD-10-CM | POA: Diagnosis not present

## 2022-01-13 DIAGNOSIS — I959 Hypotension, unspecified: Secondary | ICD-10-CM | POA: Diagnosis not present

## 2022-01-13 DIAGNOSIS — Y9389 Activity, other specified: Secondary | ICD-10-CM

## 2022-01-13 DIAGNOSIS — I442 Atrioventricular block, complete: Secondary | ICD-10-CM | POA: Diagnosis present

## 2022-01-13 DIAGNOSIS — I495 Sick sinus syndrome: Secondary | ICD-10-CM | POA: Diagnosis present

## 2022-01-13 DIAGNOSIS — I480 Paroxysmal atrial fibrillation: Secondary | ICD-10-CM

## 2022-01-13 DIAGNOSIS — M5416 Radiculopathy, lumbar region: Secondary | ICD-10-CM | POA: Diagnosis not present

## 2022-01-13 DIAGNOSIS — I1311 Hypertensive heart and chronic kidney disease without heart failure, with stage 5 chronic kidney disease, or end stage renal disease: Secondary | ICD-10-CM | POA: Diagnosis not present

## 2022-01-13 DIAGNOSIS — I255 Ischemic cardiomyopathy: Secondary | ICD-10-CM | POA: Diagnosis present

## 2022-01-13 DIAGNOSIS — M2578 Osteophyte, vertebrae: Secondary | ICD-10-CM | POA: Diagnosis not present

## 2022-01-13 DIAGNOSIS — L89312 Pressure ulcer of right buttock, stage 2: Secondary | ICD-10-CM | POA: Diagnosis present

## 2022-01-13 DIAGNOSIS — M48061 Spinal stenosis, lumbar region without neurogenic claudication: Secondary | ICD-10-CM | POA: Diagnosis not present

## 2022-01-13 DIAGNOSIS — L97521 Non-pressure chronic ulcer of other part of left foot limited to breakdown of skin: Secondary | ICD-10-CM

## 2022-01-13 DIAGNOSIS — D631 Anemia in chronic kidney disease: Secondary | ICD-10-CM | POA: Diagnosis not present

## 2022-01-13 DIAGNOSIS — L97911 Non-pressure chronic ulcer of unspecified part of right lower leg limited to breakdown of skin: Secondary | ICD-10-CM | POA: Diagnosis present

## 2022-01-13 DIAGNOSIS — L8990 Pressure ulcer of unspecified site, unspecified stage: Secondary | ICD-10-CM | POA: Insufficient documentation

## 2022-01-13 DIAGNOSIS — T827XXA Infection and inflammatory reaction due to other cardiac and vascular devices, implants and grafts, initial encounter: Secondary | ICD-10-CM | POA: Diagnosis present

## 2022-01-13 DIAGNOSIS — Z79899 Other long term (current) drug therapy: Secondary | ICD-10-CM

## 2022-01-13 DIAGNOSIS — G8929 Other chronic pain: Secondary | ICD-10-CM | POA: Diagnosis present

## 2022-01-13 DIAGNOSIS — Z8249 Family history of ischemic heart disease and other diseases of the circulatory system: Secondary | ICD-10-CM

## 2022-01-13 DIAGNOSIS — Z833 Family history of diabetes mellitus: Secondary | ICD-10-CM

## 2022-01-13 DIAGNOSIS — Z881 Allergy status to other antibiotic agents status: Secondary | ICD-10-CM

## 2022-01-13 DIAGNOSIS — D72829 Elevated white blood cell count, unspecified: Secondary | ICD-10-CM | POA: Diagnosis present

## 2022-01-13 DIAGNOSIS — M1990 Unspecified osteoarthritis, unspecified site: Secondary | ICD-10-CM | POA: Diagnosis not present

## 2022-01-13 DIAGNOSIS — I509 Heart failure, unspecified: Secondary | ICD-10-CM | POA: Diagnosis not present

## 2022-01-13 DIAGNOSIS — E11621 Type 2 diabetes mellitus with foot ulcer: Secondary | ICD-10-CM | POA: Diagnosis present

## 2022-01-13 DIAGNOSIS — M545 Low back pain, unspecified: Secondary | ICD-10-CM | POA: Diagnosis not present

## 2022-01-13 DIAGNOSIS — F17290 Nicotine dependence, other tobacco product, uncomplicated: Secondary | ICD-10-CM | POA: Diagnosis present

## 2022-01-13 DIAGNOSIS — I471 Supraventricular tachycardia: Secondary | ICD-10-CM | POA: Diagnosis not present

## 2022-01-13 DIAGNOSIS — T380X5A Adverse effect of glucocorticoids and synthetic analogues, initial encounter: Secondary | ICD-10-CM | POA: Diagnosis not present

## 2022-01-13 DIAGNOSIS — E669 Obesity, unspecified: Secondary | ICD-10-CM | POA: Diagnosis not present

## 2022-01-13 DIAGNOSIS — M549 Dorsalgia, unspecified: Principal | ICD-10-CM

## 2022-01-13 DIAGNOSIS — L89322 Pressure ulcer of left buttock, stage 2: Secondary | ICD-10-CM | POA: Diagnosis present

## 2022-01-13 DIAGNOSIS — Z6832 Body mass index (BMI) 32.0-32.9, adult: Secondary | ICD-10-CM

## 2022-01-13 DIAGNOSIS — I739 Peripheral vascular disease, unspecified: Secondary | ICD-10-CM | POA: Diagnosis not present

## 2022-01-13 DIAGNOSIS — D696 Thrombocytopenia, unspecified: Secondary | ICD-10-CM | POA: Diagnosis present

## 2022-01-13 DIAGNOSIS — Z803 Family history of malignant neoplasm of breast: Secondary | ICD-10-CM

## 2022-01-13 DIAGNOSIS — Z886 Allergy status to analgesic agent status: Secondary | ICD-10-CM

## 2022-01-13 DIAGNOSIS — Z792 Long term (current) use of antibiotics: Secondary | ICD-10-CM

## 2022-01-13 DIAGNOSIS — Z89611 Acquired absence of right leg above knee: Secondary | ICD-10-CM | POA: Diagnosis not present

## 2022-01-13 DIAGNOSIS — E1129 Type 2 diabetes mellitus with other diabetic kidney complication: Secondary | ICD-10-CM | POA: Diagnosis not present

## 2022-01-13 LAB — COMPREHENSIVE METABOLIC PANEL
ALT: 19 U/L (ref 0–44)
AST: 25 U/L (ref 15–41)
Albumin: 2.4 g/dL — ABNORMAL LOW (ref 3.5–5.0)
Alkaline Phosphatase: 91 U/L (ref 38–126)
Anion gap: 11 (ref 5–15)
BUN: 14 mg/dL (ref 8–23)
CO2: 28 mmol/L (ref 22–32)
Calcium: 8.4 mg/dL — ABNORMAL LOW (ref 8.9–10.3)
Chloride: 96 mmol/L — ABNORMAL LOW (ref 98–111)
Creatinine, Ser: 2.03 mg/dL — ABNORMAL HIGH (ref 0.61–1.24)
GFR, Estimated: 36 mL/min — ABNORMAL LOW (ref >60)
Glucose, Bld: 157 mg/dL — ABNORMAL HIGH (ref 70–99)
Potassium: 3.7 mmol/L (ref 3.5–5.1)
Sodium: 135 mmol/L (ref 135–145)
Total Bilirubin: 0.9 mg/dL (ref 0.3–1.2)
Total Protein: 5.5 g/dL — ABNORMAL LOW (ref 6.5–8.1)

## 2022-01-13 LAB — CBC WITH DIFFERENTIAL/PLATELET
Abs Immature Granulocytes: 0.06 10*3/uL (ref 0.00–0.07)
Basophils Absolute: 0 10*3/uL (ref 0.0–0.1)
Basophils Relative: 0 %
Eosinophils Absolute: 0 10*3/uL (ref 0.0–0.5)
Eosinophils Relative: 0 %
HCT: 36 % — ABNORMAL LOW (ref 39.0–52.0)
Hemoglobin: 10.9 g/dL — ABNORMAL LOW (ref 13.0–17.0)
Immature Granulocytes: 1 %
Lymphocytes Relative: 21 %
Lymphs Abs: 1.9 10*3/uL (ref 0.7–4.0)
MCH: 30.4 pg (ref 26.0–34.0)
MCHC: 30.3 g/dL (ref 30.0–36.0)
MCV: 100.6 fL — ABNORMAL HIGH (ref 80.0–100.0)
Monocytes Absolute: 0.6 10*3/uL (ref 0.1–1.0)
Monocytes Relative: 6 %
Neutro Abs: 6.7 10*3/uL (ref 1.7–7.7)
Neutrophils Relative %: 72 %
Platelets: UNDETERMINED 10*3/uL (ref 150–400)
RBC: 3.58 MIL/uL — ABNORMAL LOW (ref 4.22–5.81)
RDW: 18.8 % — ABNORMAL HIGH (ref 11.5–15.5)
WBC: 9.2 10*3/uL (ref 4.0–10.5)
nRBC: 0.7 % — ABNORMAL HIGH (ref 0.0–0.2)

## 2022-01-13 LAB — MAGNESIUM: Magnesium: 1.7 mg/dL (ref 1.7–2.4)

## 2022-01-13 MED ORDER — LORAZEPAM 1 MG PO TABS
1.0000 mg | ORAL_TABLET | Freq: Four times a day (QID) | ORAL | Status: DC | PRN
Start: 2022-01-13 — End: 2022-01-13

## 2022-01-13 MED ORDER — ATORVASTATIN CALCIUM 10 MG PO TABS
20.0000 mg | ORAL_TABLET | Freq: Every day | ORAL | Status: DC
  Administered 2022-01-13 – 2022-02-14 (×33): 20 mg via ORAL
  Filled 2022-01-13 (×33): qty 2

## 2022-01-13 MED ORDER — ADULT MULTIVITAMIN W/MINERALS CH
1.0000 | ORAL_TABLET | ORAL | Status: DC
  Administered 2022-01-15 – 2022-02-12 (×9): 1 via ORAL
  Filled 2022-01-13 (×9): qty 1

## 2022-01-13 MED ORDER — FUROSEMIDE 40 MG PO TABS
80.0000 mg | ORAL_TABLET | ORAL | Status: DC
Start: 2022-01-14 — End: 2022-02-15
  Administered 2022-01-14 – 2022-02-14 (×19): 80 mg via ORAL
  Filled 2022-01-13 (×21): qty 2

## 2022-01-13 MED ORDER — FENTANYL CITRATE PF 50 MCG/ML IJ SOSY
50.0000 ug | PREFILLED_SYRINGE | Freq: Once | INTRAMUSCULAR | Status: AC
  Administered 2022-01-13: 50 ug via INTRAVENOUS
  Filled 2022-01-13: qty 1

## 2022-01-13 MED ORDER — HYDROMORPHONE HCL 1 MG/ML IJ SOLN
1.0000 mg | Freq: Once | INTRAMUSCULAR | Status: AC | PRN
  Administered 2022-01-13: 1 mg via INTRAVENOUS
  Filled 2022-01-13: qty 1

## 2022-01-13 MED ORDER — LORAZEPAM 2 MG/ML IJ SOLN: 1.0000 mg | Freq: Once | INTRAMUSCULAR | Status: DC | PRN

## 2022-01-13 MED ORDER — INSULIN ASPART PROT & ASPART (70-30 MIX) 100 UNIT/ML ~~LOC~~ SUSP
16.0000 [IU] | Freq: Two times a day (BID) | SUBCUTANEOUS | Status: DC
  Administered 2022-01-15 – 2022-01-21 (×8): 16 [IU] via SUBCUTANEOUS
  Filled 2022-01-13: qty 10

## 2022-01-13 MED ORDER — HYDROMORPHONE HCL 1 MG/ML IJ SOLN
1.0000 mg | INTRAMUSCULAR | Status: DC | PRN
  Administered 2022-01-13 – 2022-01-20 (×20): 1 mg via INTRAVENOUS
  Filled 2022-01-13 (×22): qty 1

## 2022-01-13 MED ORDER — PANTOPRAZOLE SODIUM 40 MG PO TBEC
40.0000 mg | DELAYED_RELEASE_TABLET | Freq: Every day | ORAL | Status: DC
  Administered 2022-01-14 – 2022-01-31 (×18): 40 mg via ORAL
  Filled 2022-01-13 (×18): qty 1

## 2022-01-13 MED ORDER — MAGNESIUM HYDROXIDE 400 MG/5ML PO SUSP: 30.0000 mL | Freq: Every day | ORAL | Status: DC | PRN

## 2022-01-13 MED ORDER — ONDANSETRON HCL 4 MG/2ML IJ SOLN
4.0000 mg | Freq: Four times a day (QID) | INTRAMUSCULAR | Status: DC | PRN
Start: 2022-01-13 — End: 2022-02-15
  Filled 2022-01-13: qty 2

## 2022-01-13 MED ORDER — METOPROLOL TARTRATE 25 MG PO TABS
25.0000 mg | ORAL_TABLET | Freq: Two times a day (BID) | ORAL | Status: DC
  Administered 2022-01-13 – 2022-02-10 (×49): 25 mg via ORAL
  Administered 2022-02-11: 12.5 mg via ORAL
  Administered 2022-02-11 – 2022-02-14 (×6): 25 mg via ORAL
  Filled 2022-01-13 (×60): qty 1

## 2022-01-13 MED ORDER — OXYCODONE HCL 5 MG PO TABS
5.0000 mg | ORAL_TABLET | Freq: Once | ORAL | Status: AC
  Administered 2022-01-13: 5 mg via ORAL
  Filled 2022-01-13: qty 1

## 2022-01-13 MED ORDER — OXYCODONE-ACETAMINOPHEN 5-325 MG PO TABS
1.0000 | ORAL_TABLET | Freq: Three times a day (TID) | ORAL | Status: DC | PRN
  Administered 2022-01-13: 1 via ORAL
  Filled 2022-01-13: qty 1

## 2022-01-13 MED ORDER — TRAZODONE HCL 50 MG PO TABS
25.0000 mg | ORAL_TABLET | Freq: Every evening | ORAL | Status: DC | PRN
  Administered 2022-01-19 – 2022-02-13 (×17): 25 mg via ORAL
  Filled 2022-01-13 (×19): qty 1

## 2022-01-13 MED ORDER — LIDOCAINE-PRILOCAINE 2.5-2.5 % EX CREA: 1.0000 "application " | TOPICAL_CREAM | CUTANEOUS | Status: DC | PRN

## 2022-01-13 MED ORDER — HYDROMORPHONE HCL 1 MG/ML IJ SOLN
0.5000 mg | Freq: Once | INTRAMUSCULAR | Status: AC
  Administered 2022-01-13: 0.5 mg via INTRAVENOUS
  Filled 2022-01-13: qty 1

## 2022-01-13 MED ORDER — ONDANSETRON HCL 4 MG PO TABS
4.0000 mg | ORAL_TABLET | Freq: Four times a day (QID) | ORAL | Status: DC | PRN
Start: 2022-01-13 — End: 2022-02-15
  Administered 2022-02-06: 4 mg via ORAL
  Filled 2022-01-13 (×2): qty 1

## 2022-01-13 MED ORDER — LIDOCAINE 5 % EX PTCH
1.0000 | MEDICATED_PATCH | CUTANEOUS | Status: DC
  Administered 2022-01-13 – 2022-02-14 (×6): 1 via TRANSDERMAL
  Filled 2022-01-13 (×19): qty 1

## 2022-01-13 MED ORDER — APIXABAN 5 MG PO TABS
5.0000 mg | ORAL_TABLET | Freq: Two times a day (BID) | ORAL | Status: DC
  Administered 2022-01-13 – 2022-01-15 (×5): 5 mg via ORAL
  Filled 2022-01-13 (×6): qty 1

## 2022-01-13 NOTE — ED Provider Notes (Signed)
[facility]PART[facility] Provider Note  History   Chief Complaint  Patient presents with   Back Pain   Weakness   Alexander [name] is a 61 y.o. male w/ h/o A-fib (on Eliquis), CAD, s/p medtronic pacemaker, DM, h/o endocarditis, ESRD (HD T/T/Sa), GERD, HTN, HLD, PVD, s/p R BKA and L TMA, ischemic changes to BLE (on oxycodone) who p/w acute on chronic low back pain.   The history is provided by the patient.  Illness Location:  Low back pain Quality:  Chronic low back pain, unable to characterize, ?sharp Severity:  Severe Onset quality:  Sudden Duration: chronic x months. Timing:  Unable to specify Progression:  Unchanged Chronicity:  Chronic Context:  See MDM Associated symptoms: no abdominal pain, no chest pain, no congestion, no cough, no diarrhea, no fever, no headaches, no nausea, no rash, no shortness of breath and no sore throat     Past Medical History:  Diagnosis Date   Acute blood loss anemia    Alcohol abuse    Anemia    Anemia of chronic disease    Arthritis    "patient does not think so."   Atrial fibrillation ([place])    Cardiac syncope 10/07/2014   rapid A fib with 8 sec pauses on converison with syncope- temp pacing wire placed then PPM   Cataract    BILATERAL   Coronary artery disease    Diabetes mellitus    dx---been  awhile   Elevated LFTs    Endocarditis of tricuspid valve    ESRD (end stage renal disease) ([place])    [place]   GERD (gastroesophageal reflux disease)    History of blood transfusion    Hyperlipemia    Hypertension    Malnutrition ([place])    Osteomyelitis ([facility])    right foot   Peripheral vascular disease ([facility])    Presence of permanent cardiac pacemaker 10/08/2014   Medtronic   Tricuspid valve vegetation     Social History   Tobacco Use   Smoking status: Former    Packs/day: 1.50    Years: 30.00    Pack years: 45.00    Types: Cigarettes    Quit date: 10/07/2014    Years since quitting:  7.2   Smokeless tobacco: Never   Tobacco comments:    Does an occasional cigar  Vaping Use   Vaping Use: Never used  Substance Use Topics   Alcohol use: Not Currently    Alcohol/week: 21.0 standard drinks    Types: 21 Glasses of wine per week    Comment: cut way back, none in the last month   Drug use: No     Family History  Problem Relation Age of Onset   Diabetes type II Mother    Hypertension Mother    Diabetes Mother    Liver cancer Father    Diabetes type II Sister    Breast cancer Sister    Diabetes Sister    Hypertension Sister    Diabetes type II Brother    Kidney failure Brother    Diabetes Brother    Hypertension Brother    Diabetes type II Sister     Review of Systems  Constitutional:  Negative for chills and fever.  HENT:  Negative for congestion and sore throat.   Eyes:  Negative for photophobia and visual disturbance.  Respiratory:  Negative for cough and shortness of breath.   Cardiovascular:  Negative for chest pain and palpitations.  Gastrointestinal:  Negative for abdominal pain, blood in stool, diarrhea and nausea.  Endocrine: Negative.   Genitourinary:  Negative for dysuria and hematuria.  Musculoskeletal:  Positive for back pain. Negative for neck pain and neck stiffness.  Skin:  Negative for rash and wound.  Allergic/Immunologic: Negative.   Neurological:  Positive for weakness. Negative for syncope and headaches.  Hematological: Negative.   Psychiatric/Behavioral: Negative.      Physical Exam   Today's Vitals   01/13/22 1251  BP: (!) 90/55  Pulse: 90  Temp: 97.9 F (36.6 C)  TempSrc: Oral  SpO2: 91%  PainSc: 10-Worst pain ever     Physical Exam Constitutional:      General: He is not in acute distress.    Appearance: He is normal weight. He is not ill-appearing or toxic-appearing.     Comments: Chronically ill, unkempt appearing  HENT:     Head: Normocephalic and atraumatic.     Nose: Nose normal. No congestion.      Mouth/Throat:     Mouth: Mucous membranes are moist.     Pharynx: Oropharynx is clear. No oropharyngeal exudate.     Comments: Poor dentition  Eyes:     Extraocular Movements: Extraocular movements intact.     Pupils: Pupils are equal, round, and reactive to light.  Cardiovascular:     Rate and Rhythm: Normal rate and regular rhythm.     Pulses: Normal pulses.     Heart sounds: No murmur heard. Pulmonary:     Effort: Pulmonary effort is normal. No respiratory distress.     Breath sounds: Normal breath sounds. No stridor. No wheezing, rhonchi or rales.  Abdominal:     General: Abdomen is flat.     Palpations: Abdomen is soft.     Tenderness: There is no abdominal tenderness. There is no right CVA tenderness, left CVA tenderness, guarding or rebound.  Musculoskeletal:        General: Normal range of motion.     Cervical back: Normal range of motion and neck supple.     Comments: He has right BKA and left midfoot amputation with graft both lower extremities. Pitting edema to level of L knee. L-spine midline TTP. No C/T-spine midline TTP.   Skin:    General: Skin is warm and dry.     Capillary Refill: Capillary refill takes less than 2 seconds.     Findings: No rash.  Neurological:     Mental Status: He is alert and oriented to person, place, and time. Mental status is at baseline.     Cranial Nerves: No cranial nerve deficit.  Psychiatric:        Mood and Affect: Mood normal.        Behavior: Behavior normal.    ED Course  Procedures  Medical Decision Making:  Alexander [name] is a 61 y.o. male w/ h/o A-fib (on Eliquis), CAD, s/p Medtronic pacemaker, DM, h/o endocarditis, ESRD (HD T/T/Sa), GERD, HTN, HLD, PVD, s/p R BKA and L TMA, ischemic changes to BLE (on oxycodone) who p/w acute on chronic low back pain.   Patient states he has had ongoing low back pain "for a while now", he states it starts in his midline L-spine and radiates down bilateral legs occasionally, but last 4 months  it is gotten worse.  Patient was recently admitted at the end of December for SOB, found to have CT evidence of TV/pacemaker vegetations and subacute low back pain with CT finding L2, L3, L5  SEP fx.   Patient states that neurosurgery ordered an outpatient MRI of the L-spine which is scheduled for 02/07/2022, but the patient states this is "too long to wait", and states that his pain is increasing.  Patient states today he has had decreased ability to move his left lower extremity and is unsure if this is due to back pain, lower extremity weakness, or the weight of his lower extremity in the setting of swelling.  Patient states he follows with Ortho (most recently on 01/10/2022) for post amputation follow-up, his most recent visit reports concern for ischemic changes and significant edema with weeping to bilateral lower extremities.  Their plan was for compression wraps and close follow-up.  Patient states that previous to this he does not usually have pitting edema in his lower extremities, but this has been an ongoing problem with him recently causing his legs to be very heavy despite dialysis "trying to take off volume."  The patient did attend dialysis today.  The patient states that today while sliding on his board to transfer from his wheelchair into the car, he fell onto the floor board in the car left side.  He denies hitting his head or losing consciousness.  He denies any worsening back pain with this fall.  States his family had to assist him with getting up into the seat.  Similarly, as he was sliding on the board into his wheelchair exiting the wheelchair Lucianne Lei, he slid off the board on to his left side <1 foot onto the ground.  Again, did not hit his head, did not lose consciousness, no worsening back pain from this fall.  The patient states his chronic back pain is severe to the point of "I do not sleep in bed anymore, because walking get in bed, it hurts too much to get out of bed even with assistance,  so now I just sleep in my wheelchair."  The patient is hemodynamically stable on examination.  BP 103/62, HR 90, temp 97.9 F, RR 18, satting 93% on RA. Lungs clear, abdomen soft/nontender, small hemostatic skin tear on the lateral aspect of his right forearm, lower extremities and compression wrapping, pitting edema to the level of left knee, no pitting edema noted in right thigh.  No warmth or erythema appreciable.  Compartments soft.  No evidence of head trauma.  Baseline neurologic examination.  No midline C/T spine TTP.  However patient has midline L-spine TTP without step-offs or deformities.  Patient is anticoagulated on Eliquis Denies history of IV drug use Denies history of AAA Denies saddle anesthesia Denies urinary or fecal incontinence Denies any recent trauma other than the 2 falls today.  ER provider interpretation of Imaging / Radiology:  MRI L-spine: Pending at time of handoff  ER provider interpretation of Labs:  CBC without leukocytosis or acute anemia CMP without significant electrolyte abnormality or AKI (Cr baseline) Mag: 1.7  Key medications administered in the ER:  P.o. oxycodone 5 mg  Diagnoses considered: Pending MR L-spine to r/o [facility] emergency. R/o epidural abscess. R/o acute fx. I do not think that [name] is experiencing cauda equina syndrome, abdominal aortic aneurysm, aortic dissection, spinal hematoma, nephrolithiasis, or spinal metastasis.  Consulted: None thus far  Patient seen in conjunction with Dr. Ashok Cordia Patient care transitioned to Dr. Greta Doom at 1500  Plan at time of handoff: Follow-up MRI L-spine  Dragon medical dictation software was used in the creation of this note.   Electronically signed by: Wynetta Fines, MD on  01/13/2022 at 1:01 PM  Clinical Impression:  1. Severe back pain   2. ESRD on dialysis [facility])     Dispo: Data Rogelio Seen, MD 01/15/22 Kristian Covey    Lajean Saver, MD 01/16/22 [phone]

## 2022-01-13 NOTE — ED Notes (Signed)
Pt continues to c/o 10/10 pain in back and legs. Previously had admit provider paged. Advised by admit provider that he would place orders. Contacted pharmacy waiting on them to approve medications ?

## 2022-01-13 NOTE — ED Notes (Signed)
Received verbal report from Oletha Cruel at this time ?

## 2022-01-13 NOTE — ED Triage Notes (Signed)
Pt reports neurosurgeon has MRI 02/07/22; pt reports can't wait that long. Reports two falls today. ?

## 2022-01-13 NOTE — H&P (Addendum)
[place]   PATIENT NAME: Alexander [name]    MR#:  [id number]  DATE OF BIRTH:  03/18/1961  DATE OF ADMISSION:  01/13/2022  PRIMARY CARE PHYSICIAN: Tisovec, [name], MD   Patient is coming from: Home  REQUESTING/REFERRING PHYSICIAN: Jacelyn Pi, MD  CHIEF COMPLAINT:   Chief Complaint  Patient presents with   Back Pain   Weakness    HISTORY OF PRESENT ILLNESS:  Alexander [name] is a 61 y.o. Caucasian male with medical history significant for end-stage renal disease on HD on TTS, hypertension, dyslipidemia, peripheral vascular disease, status post right BKA and left midfoot amputation, atrial fibrillation on Eliquis, coronary artery disease, type 2 diabetes mellitus, and status post pacemaker, presented to the ER with onset of intractable low back pain over the last couple of days with associated left lower extremity radiation and radiculopathy with associated spasm and weakness.  No fever or chills.  No nausea or vomiting or abdominal pain.  No chest pain or palpitations.  No cough or wheezing or dyspnea.  He denies any urinary or stool incontinence.  The patient just had his ast hemodialysis session today.  He has been having chronic low back pain with occasional radiation to both lower extremities.  He was scheduled for LS spine MRI on 02/07/2022.  He was noted to have significant edema with weeping in both lower extremities with recent Ortho follow-up status post his amputation.  This was managed with compression wraps and close follow-up.  ED Course: Upon presentation to the ER, BP was 90/55 and later 104/65 otherwise normal vital signs.  Labs revealed a BUN of 40 and creatinine 2.03 with chloride of 96 protein 5.5 with albumin of 2.4 EKG as reviewed by me : EKG showed normal AV dual paced rhythm with a rate of 108. Imaging: LS-spine MRI cannot be obtained till Monday.  CT of the LS spine revealed the following: 1. Progressive increase in size of inferior L2 and superior  L3 endplate deformities, likely degenerative. Gas in the disc space argues against infection. 2. Moderate spinal canal and bilateral neural foraminal stenosis at L2-3. 3. Moderate bilateral L5-S1 foraminal stenosis. 4. Aortic Atherosclerosis.  The patient was given 50 mcg of IV fentanyl, 5 mg of p.o. oxycodone, 0.5 mg and later 1 mg of IV Dilaudid.  He was still having pain.  He will be admitted to a medical for further evaluation and management. PAST MEDICAL HISTORY:   Past Medical History:  Diagnosis Date   Acute blood loss anemia    Alcohol abuse    Anemia    Anemia of chronic disease    Arthritis    "patient does not think so."   Atrial fibrillation ([place])    Cardiac syncope 10/07/2014   rapid A fib with 8 sec pauses on converison with syncope- temp pacing wire placed then PPM   Cataract    BILATERAL   Coronary artery disease    Diabetes mellitus    dx---been  awhile   Elevated LFTs    Endocarditis of tricuspid valve    ESRD (end stage renal disease) ([place])    [place]   GERD (gastroesophageal reflux disease)    History of blood transfusion    Hyperlipemia    Hypertension    Malnutrition ([place])    Osteomyelitis ([place])    right foot   Peripheral vascular disease ([place])    Presence of permanent cardiac pacemaker 10/08/2014   [employer]  Tricuspid valve vegetation     PAST SURGICAL HISTORY:   Past Surgical History:  Procedure Laterality Date   A/V FISTULAGRAM Right 05/01/2021   Procedure: A/V FISTULAGRAM;  Surgeon: Waynetta Sandy, MD;  Location: [place] [facility];  Service: Cardiovascular;  Laterality: Right;  failed fistulagram   ABDOMINAL AORTOGRAM W/LOWER EXTREMITY N/A 09/09/2017   Procedure: ABDOMINAL AORTOGRAM W/LOWER EXTREMITY;  Surgeon: Angelia Mould, MD;  Location: [place] [facility];  Service: Cardiovascular;  Laterality: N/A;   ABDOMINAL AORTOGRAM W/LOWER EXTREMITY Right 11/11/2019   Procedure:  ABDOMINAL AORTOGRAM W/LOWER EXTREMITY;  Surgeon: Marty Heck, MD;  Location: [place] [facility];  Service: Cardiovascular;  Laterality: Right;   AMPUTATION Left 08/19/2013   Procedure: AMPUTATION RAY;  Surgeon: Alta Corning, MD;  Location: [place];  Service: Orthopedics;  Laterality: Left;  ray amputation left 5th   AMPUTATION Left 10/27/2013   Procedure: AMPUTATION DIGIT-LEFT 4TH TOE, 4th and 5th metatarsal.;  Surgeon: Angelia Mould, MD;  Location: [place];  Service: Vascular;  Laterality: Left;   AMPUTATION Left 11/04/2013   Procedure: LEFT FOOT TRANS-METATARSAL AMPUTATION WITH WOUND CLOSURE ;  Surgeon: Alta Corning, MD;  Location: [place];  Service: Orthopedics;  Laterality: Left;   AMPUTATION Right 09/10/2017   Procedure: RIGHT FOURTH AND FIFTH TOE AMPUTATION;  Surgeon: Angelia Mould, MD;  Location: [place];  Service: Vascular;  Laterality: Right;   AMPUTATION Right 02/19/2020   Procedure: AMPUTATION RIGHT 3RD TOE;  Surgeon: Angelia Mould, MD;  Location: [place];  Service: Vascular;  Laterality: Right;   AMPUTATION Right 03/11/2020   Procedure: RIGHT BELOW KNEE AMPUTATION;  Surgeon: Newt Minion, MD;  Location: [place];  Service: Orthopedics;  Laterality: Right;   APPLICATION OF WOUND VAC Right 02/19/2020   Procedure: Application Of Wound Vac Right foot;  Surgeon: Angelia Mould, MD;  Location: [place];  Service: Vascular;  Laterality: Right;   AV FISTULA PLACEMENT Right 03/27/2021   Procedure: RIGHT ARM ARTERIOVENOUS (AV) FISTULA;  Surgeon: Angelia Mould, MD;  Location: [place];  Service: Vascular;  Laterality: Right;   AV FISTULA PLACEMENT Right 05/10/2021   Procedure: RIGHT ARM ARTERIOVENOUS (AV) FISTULA;  Surgeon: Waynetta Sandy, MD;  Location: [place];  Service: Vascular;  Laterality: Right;   [place] Right 08/11/2021   Procedure: RIGHT ARM SECOND STAGE BASILIC VEIN TRANSPOSITION;  Surgeon:  Waynetta Sandy, MD;  Location: [place];  Service: Vascular;  Laterality: Right;   BIOPSY  07/20/2020   Procedure: BIOPSY;  Surgeon: Irving Copas., MD;  Location: [place];  Service: Gastroenterology;;   CARDIAC CATHETERIZATION  10/07/2014   Procedure: LEFT HEART CATH AND CORONARY ANGIOGRAPHY;  Surgeon: Laverda Page, MD;  Location: [facility] C[facility] LAB;  Service: Cardiovascular;;   COLONOSCOPY W/ POLYPECTOMY     COLONOSCOPY WITH PROPOFOL N/A 05/25/2020   Procedure: COLONOSCOPY WITH PROPOFOL;  Surgeon: Gatha Mayer, MD;  Location: [facility];  Service: Endoscopy;  Laterality: N/A;   EMBOLECTOMY Right 09/12/2017   Procedure: Thrombectomy  & Redo Right Below Knee Popliteal Artery Bypass Graft  ;  Surgeon: Waynetta Sandy, MD;  Location: [facility] OR;  Service: Vascular;  Laterality: Right;   ENTEROSCOPY N/A 07/20/2020   Procedure: ENTEROSCOPY;  Surgeon: Rush Landmark Telford Nab., MD;  Location: [place];  Service: Gastroenterology;  Laterality: N/A;   ESOPHAGOGASTRODUODENOSCOPY (EGD) WITH PROPOFOL N/A 05/24/2020   Procedure: ESOPHAGOGASTRODUODENOSCOPY (EGD) WITH PROPOFOL;  Surgeon:  Ladene Artist, MD;  Location: [facility];  Service: Endoscopy;  Laterality: N/A;   EYE SURGERY Bilateral    cataract   FEMORAL-TIBIAL BYPASS GRAFT Left 10/27/2013   Procedure: BYPASS GRAFT LEFT FEMORAL- POSTERIOR TIBIAL ARTERY;  Surgeon: Angelia Mould, MD;  Location: [place];  Service: Vascular;  Laterality: Left;   FEMORAL-TIBIAL BYPASS GRAFT Right 09/10/2017   Procedure: RIGHT SUPERFICIAL  FEMORAL ARTERY-BELOW KNEE POPLITEAL ARTERY BYPASS GRAFT WITH VEIN;  Surgeon: Angelia Mould, MD;  Location: [place];  Service: Vascular;  Laterality: Right;   GIVENS CAPSULE STUDY N/A 07/20/2020   Procedure: GIVENS CAPSULE STUDY;  Surgeon: Irving Copas., MD;  Location: [place];  Service: Gastroenterology;  Laterality: N/A;  capsule placed through scope into  duodenum @ 0936   I & D EXTREMITY Left 10/27/2013   Procedure: IRRIGATION AND DEBRIDEMENT EXTREMITY- LEFT FOOT;  Surgeon: Angelia Mould, MD;  Location: [place];  Service: Vascular;  Laterality: Left;   I & D EXTREMITY Right 01/26/2020   Procedure: IRRIGATION AND DEBRIDEMENT EXTREMITY RIGHT FOOT WOUND;  Surgeon: Angelia Mould, MD;  Location: [place];  Service: Vascular;  Laterality: Right;   INSERT / REPLACE / REMOVE PACEMAKER     INSERTION OF DIALYSIS CATHETER Right 11/03/2021   Procedure: RIGHT INTERNAL JUGULAR INSERTION OF TUNNELED DIALYSIS CATHETER;  Surgeon: Angelia Mould, MD;  Location: [place];  Service: Vascular;  Laterality: Right;   INTRAOPERATIVE ARTERIOGRAM Right 09/10/2017   Procedure: INTRA OPERATIVE ARTERIOGRAM;  Surgeon: Angelia Mould, MD;  Location: [place];  Service: Vascular;  Laterality: Right;   IR ANGIOGRAM FOLLOW UP STUDY  09/09/2017   IR FLUORO GUIDE CV LINE RIGHT  05/31/2020   IR US GUIDE VASC ACCESS RIGHT  06/01/2020   LEFT HEART CATH AND CORONARY ANGIOGRAPHY N/A 08/19/2020   Procedure: LEFT HEART CATH AND CORONARY ANGIOGRAPHY;  Surgeon: Adrian Prows, MD;  Location: [place] [facility];  Service: Cardiovascular;  Laterality: N/A;   LEFT HEART CATHETERIZATION WITH CORONARY ANGIOGRAM N/A 11/09/2014   Procedure: LEFT HEART CATHETERIZATION WITH CORONARY ANGIOGRAM;  Surgeon: Laverda Page, MD;  Location: [facility] CATH LAB;  Service: Cardiovascular;  Laterality: N/A;   LOWER EXTREMITY ANGIOGRAM Right 11/08/2015   Procedure: Lower Extremity Angiogram;  Surgeon: Serafina Mitchell, MD;  Location: [place] [facility];  Service: Cardiovascular;  Laterality: Right;   LUMBAR LAMINECTOMY  06/13/2012   Procedure: MICRODISCECTOMY LUMBAR LAMINECTOMY;  Surgeon: Jessy Oto, MD;  Location: [place];  Service: Orthopedics;  Laterality: N/A;  Central laminectomy L2-3, L3-4, L4-5   PACEMAKER INSERTION  10/08/2014   MDT Advisa MRI compatible dual chamber  pacemaker implanted by Dr Caryl Comes for syncope with post-termination pauses   PERCUTANEOUS CORONARY STENT INTERVENTION (PCI-S)  11/09/2014   des to lad & distal circumflex         Dr  [name]   PERIPHERAL VASCULAR BALLOON ANGIOPLASTY Right 11/11/2019   Procedure: PERIPHERAL VASCULAR BALLOON ANGIOPLASTY;  Surgeon: Marty Heck, MD;  Location: [place] [facility];  Service: Cardiovascular;  Laterality: Right;  below knee popliteal, tibioperoneal trunk, posterior tibial arteries   PERIPHERAL VASCULAR CATHETERIZATION N/A 11/08/2015   Procedure: Abdominal Aortogram;  Surgeon: Serafina Mitchell, MD;  Location: [place] [facility];  Service: Cardiovascular;  Laterality: N/A;   PERIPHERAL VASCULAR CATHETERIZATION Right 11/08/2015   Procedure: Peripheral Vascular Atherectomy;  Surgeon: Serafina Mitchell, MD;  Location: [place] [facility];  Service: Cardiovascular;  Laterality: Right;   PERIPHERAL VASCULAR CATHETERIZATION  N/A 10/10/2016   Procedure: Lower Extremity Angiography;  Surgeon: Waynetta Sandy, MD;  Location: [place] [facility];  Service: Cardiovascular;  Laterality: N/A;   PERIPHERAL VASCULAR CATHETERIZATION Right 10/10/2016   Procedure: Peripheral Vascular Atherectomy;  Surgeon: Waynetta Sandy, MD;  Location: [place] [facility];  Service: Cardiovascular;  Laterality: Right;  Popliteal   PERMANENT PACEMAKER INSERTION N/A 10/08/2014   Procedure: PERMANENT PACEMAKER INSERTION;  Surgeon: Deboraha Sprang, MD;  Location[facility] LAB;  Service: Cardiovascular;  Laterality: N/A;   SUBMUCOSAL TATTOO INJECTION  07/20/2020   Procedure: SUBMUCOSAL TATTOO INJECTION;  Surgeon: Irving Copas., MD;  Location: [place];  Service: Gastroenterology;;   TEE WITHOUT CARDIOVERSION N/A 10/27/2021   Procedure: TRANSESOPHAGEAL ECHOCARDIOGRAM (TEE);  Surgeon: Adrian Prows, MD;  Location: [facility] ENDOSCOPY;  Service: Cardiovascular;  Laterality: N/A;   TEMPORARY PACEMAKER INSERTION Bilateral  10/07/2014   Procedure: TEMPORARY PACEMAKER INSERTION;  Surgeon: Laverda Page, MD;  Location: [facility] CATH LAB;  Service: Cardiovascular;  Laterality: Bilateral;   WOUND DEBRIDEMENT Right 02/19/2020   Procedure: DEBRIDEMENT WOUND RIGHT FOOT;  Surgeon: Angelia Mould, MD;  Location: [facility] OR;  Service: Vascular;  Laterality: Right;    SOCIAL HISTORY:   Social History   Tobacco Use   Smoking status: Former    Packs/day: 1.50    Years: 30.00    Pack years: 45.00    Types: Cigarettes    Quit date: 10/07/2014    Years since quitting: 7.2   Smokeless tobacco: Never   Tobacco comments:    Does an occasional cigar  Substance Use Topics   Alcohol use: Not Currently    Alcohol/week: 21.0 standard drinks    Types: 21 Glasses of wine per week    Comment: cut way back, none in the last month    FAMILY HISTORY:   Family History  Problem Relation Age of Onset   Diabetes type II Mother    Hypertension Mother    Diabetes Mother    Liver cancer Father    Diabetes type II Sister    Breast cancer Sister    Diabetes Sister    Hypertension Sister    Diabetes type II Brother    Kidney failure Brother    Diabetes Brother    Hypertension Brother    Diabetes type II Sister     DRUG ALLERGIES:   Allergies  Allergen Reactions   Cytoxan [Cyclophosphamide] Other (See Comments)    Pancytopenia   Pletal [Cilostazol] Palpitations and Other (See Comments)    "Can hear heart beating loudly".   Keflex [Cephalexin] Nausea And Vomiting   Tylenol [Acetaminophen] Other (See Comments)    Patient has a cold sweat    REVIEW OF SYSTEMS:   ROS As per history of present illness. All pertinent systems were reviewed above. Constitutional, HEENT, cardiovascular, respiratory, GI, GU, musculoskeletal, neuro, psychiatric, endocrine, integumentary and hematologic systems were reviewed and are otherwise negative/unremarkable except for positive findings mentioned above in the  HPI.   MEDICATIONS AT HOME:   Prior to Admission medications   Medication Sig Start Date End Date Taking? Authorizing Provider  amoxicillin (AMOXIL) 500 MG capsule Take 1 capsule (500 mg total) by mouth at bedtime. 11/22/21 11/17/22 Yes Vu, Rockey Situ, MD  apixaban (ELIQUIS) 5 MG TABS tablet Take 1 tablet (5 mg total) by mouth 2 (two) times daily. 12/11/21 03/11/22 Yes Cantwell, Celeste C, PA-C  atorvastatin (LIPITOR) 20 MG tablet Take 1 tablet (20 mg total) by mouth at bedtime.  10/01/21  Yes Aline August, MD  doxycycline (VIBRA-TABS) 100 MG tablet Take 1 tablet (100 mg total) by mouth 2 (two) times daily. 11/22/21 11/17/22 Yes Vu, Rockey Situ, MD  furosemide (LASIX) 80 MG tablet Take 80 mg by mouth See admin instructions. Take 80 mg by mouth on (non-dialysis days) Mondays, Wednesdays, Fridays & Sundays in the morning as needed "to cause urination" 02/26/21  Yes [provider]  ibuprofen (ADVIL) 800 MG tablet Take 800 mg by mouth every 8 (eight) hours as needed for moderate pain.   Yes [provider]  lidocaine-prilocaine (EMLA) cream Apply 1 application topically every dialysis (on Tues/Thurs/Sat).   Yes [provider]  metoprolol tartrate (LOPRESSOR) 25 MG tablet Take 1 tablet (25 mg total) by mouth 2 (two) times daily. 05/24/21 05/19/22 Yes Adrian Prows, MD  Multiple Vitamin (MULTIVITAMIN WITH MINERALS) TABS tablet Take 1 tablet by mouth 2 (two) times a week.   Yes [provider]  NOVOLIN 70/30 (70-30) 100 UNIT/ML injection Inject 16 Units into the skin 2 (two) times daily with a meal. Patient taking differently: Inject 10-20 Units into the skin 2 (two) times daily as needed (high blood sugar). 11/06/21  Yes Patrecia Pour, MD  oxyCODONE-acetaminophen (PERCOCET/ROXICET) 5-325 MG tablet Take 1 tablet by mouth every 8 (eight) hours as needed for moderate pain or severe pain. 01/11/22  Yes Newt Minion, MD  pantoprazole (PROTONIX) 40 MG tablet Take 40 mg by mouth daily.   Yes  [provider]  ACCU-CHEK FASTCLIX LANCETS MISC Check blood sugar TID & QHS 10/01/14   Advani, [place] Prey, MD  blood glucose meter kit and supplies KIT Check blood sugar TID & QHS 02/10/15   Advani, [place] Prey, MD  Blood Glucose Monitoring Suppl (ACCU-CHEK ADVANTAGE DIABETES) kit Use as instructed 10/01/14   Lorayne Marek, MD  glucose blood (ACCU-CHEK AVIVA PLUS) test strip Use as instructed 10/01/14   Advani, [place] Prey, MD  glucose blood (CHOICE DM FORA G20 TEST STRIPS) test strip Check blood sugar TID & QHS 02/10/15   Advani, [place] Prey, MD  glucose blood test strip Use as instructed 11/20/13   Reyne Dumas, MD  methylPREDNISolone (MEDROL DOSEPAK) 4 MG TBPK tablet See admin instructions. 12/20/21   [provider]      VITAL SIGNS:  Blood pressure (!) 154/139, pulse (!) 52, temperature 97.9 F (36.6 C), temperature source Oral, resp. rate (!) 21, height 6' (1.829 m), weight 107.5 kg, SpO2 (!) 65 %.  PHYSICAL EXAMINATION:  Physical Exam  GENERAL:  61 y.o.-year-old Caucasian male patient lying in the bed with no acute distress.  EYES: Pupils equal, round, reactive to light and accommodation. No scleral icterus. Extraocular muscles intact.  HEENT: Head atraumatic, normocephalic. Oropharynx and nasopharynx clear.  NECK:  Supple, no jugular venous distention. No thyroid enlargement, no tenderness.  LUNGS: Normal breath sounds bilaterally, no wheezing, rales,rhonchi or crepitation. No use of accessory muscles of respiration.  CARDIOVASCULAR: Regular rate and rhythm, S1, S2 normal. No murmurs, rubs, or gallops.  ABDOMEN: Soft, nondistended, nontender. Bowel sounds present. No organomegaly or mass.  EXTREMITIES: He has right BKA and left midfoot amputation with graft both lower extremities.  NEUROLOGIC: Cranial nerves II through XII are intact. Muscle strength 5/5 in all extremities. Sensation intact. Gait not checked.  PSYCHIATRIC: The patient is alert and oriented x 3.  Normal affect and good  eye contact. SKIN: No obvious rash, lesion, or ulcer.   LABORATORY PANEL:   CBC Recent Labs  Lab 01/13/22 1505  WBC 9.2  HGB 10.9*  HCT 36.0*  PLT PLATELET CLUMPS NOTED ON SMEAR, UNABLE TO ESTIMATE   ------------------------------------------------------------------------------------------------------------------  Chemistries  Recent Labs  Lab 01/13/22 1505  NA 135  K 3.7  CL 96*  CO2 28  GLUCOSE 157*  BUN 14  CREATININE 2.03*  CALCIUM 8.4*  MG 1.7  AST 25  ALT 19  ALKPHOS 91  BILITOT 0.9   ------------------------------------------------------------------------------------------------------------------  Cardiac Enzymes No results for input(s): TROPONINI in the last 168 hours. ------------------------------------------------------------------------------------------------------------------  RADIOLOGY:  CT Lumbar Spine Wo Contrast  Result Date: 01/13/2022 CLINICAL DATA:  Low back pain EXAM: CT LUMBAR SPINE WITHOUT CONTRAST TECHNIQUE: Multidetector CT imaging of the lumbar spine was performed without intravenous contrast administration. Multiplanar CT image reconstructions were also generated. RADIATION DOSE REDUCTION: This exam was performed according to the departmental dose-optimization program which includes automated exposure control, adjustment of the mA and/or kV according to patient size and/or use of iterative reconstruction technique. COMPARISON:  10/28/2021 FINDINGS: Segmentation: 5 lumbar type vertebrae. Alignment: Normal. Vertebrae: There are bilateral laminectomies at L3 and L4. There is progressive increase in size of inferior L2 and superior L3 endplate deformities. Gas in the disc space argues against infection. No acute fracture. Paraspinal and other soft tissues: Calcific aortic atherosclerosis. Disc levels: T12-L1: Unremarkable. L1-2: No spinal canal stenosis. L2-3: Progression of endplate changes. Large disc bulge with osteophytes. Moderate spinal canal  and bilateral neural foraminal stenosis. L3-4: Posterior decompression without spinal canal stenosis or neural impingement. L4-5: Small left subarticular disc protrusion without stenosis. L5-S1: Disc bulge and endplate spurring with moderate bilateral foraminal stenosis. No spinal canal stenosis. IMPRESSION: 1. Progressive increase in size of inferior L2 and superior L3 endplate deformities, likely degenerative. Gas in the disc space argues against infection. 2. Moderate spinal canal and bilateral neural foraminal stenosis at L2-3. 3. Moderate bilateral L5-S1 foraminal stenosis. Aortic Atherosclerosis (ICD10-I70.0). Electronically Signed   By: Ulyses Jarred M.D.   On: 01/13/2022 23:15      IMPRESSION AND PLAN:  Assessment and Plan: * Lumbar back pain with radiculopathy affecting left lower extremity - He has moderate neuroforaminal stenosis at L2-L3 and L5-S1. - He will be admitted to a medical bed. - Pain management will be provided. - LS-spine MRI cannot be performed till Monday given his pacemaker. - PT consult will be obtained. - Given lumbar spine CT results and depending on further MRI results on Monday, he will likely need spine surgery follow-up consult.  ESRD (end stage renal disease) ([place]) - If he is staying till Tuesday he will need nephrology consultation for hemodialysis.  PAF (paroxysmal atrial fibrillation) ([facility]) - We will continue Lopressor and Eliquis.  Essential hypertension, benign - We will continue his Lopressor.  Dyslipidemia - We will continue statin therapy.  Type 2 diabetes mellitus with chronic kidney disease, with long-term current use of insulin ([place]) - He will be placed on supplement coverage with NovoLog. - We will continue his basal coverage.  GERD without esophagitis - We will continue PPI therapy.       DVT prophylaxis: Lovenox.  Advanced Care Planning:  Code Status: full code.  Family Communication:  The plan of care was discussed in details  with the patient (and family). I answered all questions. The patient agreed to proceed with the above mentioned plan. Further management will depend upon hospital course. Disposition Plan: Back to previous home environment Consults called: He will likely need spine surgery and nephrology consults. All the records are reviewed and case  discussed with ED provider.  Status is: Inpatient  At the time of the admission, it appears that the appropriate admission status for this patient is inpatient.  This is judged to be reasonable and necessary in order to provide the required intensity of service to ensure the patient's safety given the presenting symptoms, physical exam findings and initial radiographic and laboratory data in the context of comorbid conditions.  The patient requires inpatient status due to high intensity of service, high risk of further deterioration and high frequency of surveillance required.  I certify that at the time of admission, it is my clinical judgment that the patient will require inpatient hospital care extending more than 2 midnights.                            Dispo: The patient is from: Home              Anticipated d/c is to: Home              Patient currently is not medically stable to d/c.              Difficult to place patient: No  Christel Mormon M.D on 01/14/2022 at 12:44 AM  [facility]   From 7 PM-7 AM, contact night-coverage [email or web address]  CC: Primary care physician; Tisovec, [name], MD

## 2022-01-13 NOTE — ED Notes (Signed)
Called MRI and advised the pt has a pacemaker and they couldn't do MRI until Monday ?

## 2022-01-13 NOTE — ED Triage Notes (Signed)
Pt arrives from home by POV reporting chronic ongoing back pain with increasing weakness to LLE.  Pt was dialyzed today. AO at baseline, BUE strength intact, no facial droop noted.  ?

## 2022-01-13 NOTE — ED Provider Notes (Signed)
?  Physical Exam  ?BP (!) 128/100   Pulse 83   Temp 97.9 ?F (36.6 ?C) (Oral)   Resp 13   Ht 6' (1.829 m)   Wt 107.5 kg   SpO2 95%   BMI 32.14 kg/m?  ? ?Physical Exam ?Neurological:  ?   Motor: Weakness (lle) present.  ? ? ?Procedures  ?Procedures ? ?ED Course / MDM  ?  ?Medical Decision Making ?Amount and/or Complexity of Data Reviewed ?Labs: ordered. ?Radiology: ordered. ? ?Risk ?Prescription drug management. ?Decision regarding hospitalization. ? ? ?Here with low back pain.  He is having difficulty moving his left leg secondary to pain and weakness.  Initially plan was for MRI, however he has a pacemaker that will prevent him from getting an MRI until Monday.  We will plan for CT lumbar without contrast.  However, patient will need to be admitted given that he is unable to ambulate safely and is having ongoing pain.  He has required multiple doses of IV pain medications.  Discussed with Dr. Sidney Ace from the hospitalist service. Patient to be admitted.  ? ? ? ? ?  ?Jacelyn Pi, MD ?01/13/22 2304 ? ?  ?Drenda Freeze, MD ?01/14/22 1448 ? ?

## 2022-01-14 DIAGNOSIS — N186 End stage renal disease: Secondary | ICD-10-CM | POA: Diagnosis not present

## 2022-01-14 DIAGNOSIS — M5416 Radiculopathy, lumbar region: Secondary | ICD-10-CM | POA: Diagnosis not present

## 2022-01-14 DIAGNOSIS — I10 Essential (primary) hypertension: Secondary | ICD-10-CM | POA: Diagnosis not present

## 2022-01-14 DIAGNOSIS — K219 Gastro-esophageal reflux disease without esophagitis: Secondary | ICD-10-CM

## 2022-01-14 DIAGNOSIS — E1122 Type 2 diabetes mellitus with diabetic chronic kidney disease: Secondary | ICD-10-CM

## 2022-01-14 DIAGNOSIS — E785 Hyperlipidemia, unspecified: Secondary | ICD-10-CM

## 2022-01-14 DIAGNOSIS — L8990 Pressure ulcer of unspecified site, unspecified stage: Secondary | ICD-10-CM | POA: Insufficient documentation

## 2022-01-14 LAB — BASIC METABOLIC PANEL
Anion gap: 13 (ref 5–15)
BUN: 17 mg/dL (ref 8–23)
CO2: 26 mmol/L (ref 22–32)
Calcium: 8.4 mg/dL — ABNORMAL LOW (ref 8.9–10.3)
Chloride: 96 mmol/L — ABNORMAL LOW (ref 98–111)
Creatinine, Ser: 2.58 mg/dL — ABNORMAL HIGH (ref 0.61–1.24)
GFR, Estimated: 27 mL/min — ABNORMAL LOW (ref >60)
Glucose, Bld: 153 mg/dL — ABNORMAL HIGH (ref 70–99)
Potassium: 3.9 mmol/L (ref 3.5–5.1)
Sodium: 135 mmol/L (ref 135–145)

## 2022-01-14 LAB — GLUCOSE, CAPILLARY
Glucose-Capillary: 113 mg/dL — ABNORMAL HIGH (ref 70–99)
Glucose-Capillary: 142 mg/dL — ABNORMAL HIGH (ref 70–99)
Glucose-Capillary: 166 mg/dL — ABNORMAL HIGH (ref 70–99)
Glucose-Capillary: 150 mg/dL — ABNORMAL HIGH (ref 70–99)

## 2022-01-14 LAB — CBC
HCT: 35.2 % — ABNORMAL LOW (ref 39.0–52.0)
Hemoglobin: 11 g/dL — ABNORMAL LOW (ref 13.0–17.0)
MCH: 31.3 pg (ref 26.0–34.0)
MCHC: 31.3 g/dL (ref 30.0–36.0)
MCV: 100 fL (ref 80.0–100.0)
Platelets: 104 10*3/uL — ABNORMAL LOW (ref 150–400)
RBC: 3.52 MIL/uL — ABNORMAL LOW (ref 4.22–5.81)
RDW: 18.9 % — ABNORMAL HIGH (ref 11.5–15.5)
WBC: 12.8 10*3/uL — ABNORMAL HIGH (ref 4.0–10.5)
nRBC: 0.6 % — ABNORMAL HIGH (ref 0.0–0.2)

## 2022-01-14 LAB — RESP PANEL BY RT-PCR (FLU A&B, COVID) ARPGX2
Influenza A by PCR: NEGATIVE
Influenza B by PCR: NEGATIVE
SARS Coronavirus 2 by RT PCR: NEGATIVE

## 2022-01-14 LAB — MRSA NEXT GEN BY PCR, NASAL: MRSA by PCR Next Gen: NOT DETECTED

## 2022-01-14 MED ORDER — OXYCODONE HCL 5 MG PO TABS
5.0000 mg | ORAL_TABLET | Freq: Four times a day (QID) | ORAL | Status: DC | PRN
  Administered 2022-01-14 – 2022-01-22 (×20): 5 mg via ORAL
  Filled 2022-01-14 (×20): qty 1

## 2022-01-14 MED ORDER — CHLORHEXIDINE GLUCONATE CLOTH 2 % EX PADS
6.0000 | MEDICATED_PAD | Freq: Every day | CUTANEOUS | Status: DC
  Administered 2022-01-14 – 2022-01-24 (×7): 6 via TOPICAL

## 2022-01-14 MED ORDER — INSULIN ASPART 100 UNIT/ML IJ SOLN
0.0000 [IU] | Freq: Three times a day (TID) | INTRAMUSCULAR | Status: DC
  Administered 2022-01-14: 1 [IU] via SUBCUTANEOUS
  Administered 2022-01-16: 2 [IU] via SUBCUTANEOUS
  Administered 2022-01-16 (×2): 3 [IU] via SUBCUTANEOUS
  Administered 2022-01-17: 2 [IU] via SUBCUTANEOUS
  Administered 2022-01-17: 1 [IU] via SUBCUTANEOUS
  Administered 2022-01-17: 3 [IU] via SUBCUTANEOUS
  Administered 2022-01-18: 1 [IU] via SUBCUTANEOUS
  Administered 2022-01-19: 2 [IU] via SUBCUTANEOUS
  Administered 2022-01-21 – 2022-01-27 (×6): 1 [IU] via SUBCUTANEOUS
  Administered 2022-01-27: 2 [IU] via SUBCUTANEOUS
  Administered 2022-01-28: 1 [IU] via SUBCUTANEOUS
  Administered 2022-01-28 – 2022-01-31 (×4): 2 [IU] via SUBCUTANEOUS
  Administered 2022-02-01 – 2022-02-05 (×5): 1 [IU] via SUBCUTANEOUS
  Administered 2022-02-07: 2 [IU] via SUBCUTANEOUS
  Administered 2022-02-08 – 2022-02-13 (×4): 1 [IU] via SUBCUTANEOUS

## 2022-01-14 NOTE — Assessment & Plan Note (Addendum)
Continue Lopressor and resume eliquis ?

## 2022-01-14 NOTE — Discharge Instructions (Signed)

## 2022-01-14 NOTE — Progress Notes (Signed)
Applied K Pad to patients back.  ?

## 2022-01-14 NOTE — Assessment & Plan Note (Addendum)
Last A1c 6.8 ?SSI, NovoLog 70/30 twice daily at a lower dose, hypoglycemic protocol, Accu-Cheks ?

## 2022-01-14 NOTE — Assessment & Plan Note (Addendum)
CRP, ESR elevated CT noted moderate neuroforaminal stenosis at L2-L3 and L5-S1 MRI lumbar spine showed subacute fractures L2-L3 disc, partially treated discitis/osteomyelitis is a consideration, severe multifactorial spinal stenosis at L2-3 Followed by spine surgery Dr Saintclair Halsted- surgery cancelled as high risk IR consulted for disc aspiration to rule out any infection done 3/10- culture negative Completed IV decadron Pain management as able- plan to change to PO dilaudid and monitor-- adjust as able PT/OT- recommending SNF- so far patient is refusing

## 2022-01-14 NOTE — Evaluation (Signed)
Physical Therapy Evaluation ?Patient Details ?Name: Alexander [name] ?MRN: [id number] ?DOB: [birth year] ?Today's Date: 01/14/2022 ? ?History of Present Illness ? The pt is a 61 yo male presenting 3/4 with chronic back pain and weakness to LLE in addition to frequent falls (x2 on day of admission). PMH: ESRD on HD TTS, paroxysmal atrial fibrillation on Eliquis, pacemaker, CAD s/p stent, diastolic CHF, PVD with right BKA and left midfoot amputation, history of GI bleed, and DM II. ?  ?Clinical Impression ? Pt in bed upon arrival of PT, agreeable to evaluation at this time. Prior to admission the pt was completing transfers with slide board to and from [facility] with intermittent assist from family. He states he spends majority of the day in [facility] and is otherwise minimally active. The pt presents with significant back pain at this time that occurs in spasms that sends sharp pain down his BLE. The pt was resistive to some MMT, and presents with limited PROM and AROM. Pt demos some self-limiting behavior and at times it is difficult to discern weakness vs lack of effort due to fear of back pain. He was able to demo good use of UE and core to roll side-to-side when spasms occurred, and completed partial ROM with BLE in gravity-minimized position. He declined all attempts at mobility and repositioning, stating he was unable to sit up, but on further questioning states he is fearful of the pain it will cause. Will continue to benefit from skilled PT acutely to teach improved body mechanics for transfers and improve LE strength and seated tolerance to allow for transfers OOB. Given x2 falls on day of admission and pt reports of LLE weakness since that time, suspect he will continue to need significant assist and therefore recommend SNF rehab prior to return home. Will continue to assess as pt allows for more OOB mobility and transfer training.  ?   ?   ? ?Recommendations for follow up therapy are one component of a multi-disciplinary  discharge planning process, led by the attending physician.  Recommendations may be updated based on patient status, additional functional criteria and insurance authorization. ? ?Follow Up Recommendations Skilled nursing-short term rehab (<3 hours/day) ? ?  ?Assistance Recommended at Discharge Frequent or constant Supervision/Assistance  ?Patient can return home with the following ? Two people to help with walking and/or transfers;A lot of help with bathing/dressing/bathroom;Assistance with cooking/housework;Direct supervision/assist for medications management;Assist for transportation;Help with stairs or ramp for entrance ? ?  ?Equipment Recommendations None recommended by PT  ?Recommendations for Other Services ?    ?  ?Functional Status Assessment Patient has had a recent decline in their functional status and demonstrates the ability to make significant improvements in function in a reasonable and predictable amount of time.  ? ?  ?Precautions / Restrictions Precautions ?Precautions: Fall ?Precaution Comments: x2 falls on day of admission ?Restrictions ?Weight Bearing Restrictions: No  ? ?  ? ? ? ? ? ?Pertinent Vitals/Pain    ? ? ?Home Living Family/patient expects to be discharged to:: Private residence ?Living Arrangements: Spouse/significant other ?Available Help at Discharge: Family ?Type of Home: House ?Home Access: Ramped entrance ?  ?  ?  ?Home Layout: One level ?Home Equipment: Wheelchair - IT trainer (2 wheels) ?   ?  ?Prior Function Prior Level of Function : Needs assist ?  ?  ?  ?  ?  ?  ?Mobility Comments: supervision to minA to steady slideboard for transfers. ?ADLs Comments: bird baths, family assists with dressing. ?  ? ? ?  Hand Dominance  ? Dominant Hand: Right ? ?  ?Extremity/Trunk Assessment  ? Upper Extremity Assessment ?Upper Extremity Assessment: Overall WFL for tasks assessed ?  ? ?Lower Extremity Assessment ?Lower Extremity Assessment: RLE deficits/detail;LLE  deficits/detail ?RLE Deficits / Details: pt with chronic R BKA, stump wrapped and draining liquid. pt reports wounds on stump and cannot wear prosthetic. Pt able to demo initiation of knee extension, able to flex against gravity. limited HS extensibility ?RLE: Unable to fully assess due to pain ?LLE Deficits / Details: pt unable to initiate any hip flexion even in gravity minimized position. able to complete partial knee flexion and extension in gravity minimized position. limited HS. pt reports pain and numbnes into leg ?LLE: Unable to fully assess due to pain ?  ? ?Cervical / Trunk Assessment ?Cervical / Trunk Assessment: Other exceptions ?Cervical / Trunk Exceptions: significant low back pain, spasms.  ?Communication  ? Communication: No difficulties  ?Cognition Arousal/Alertness: Awake/alert ?Behavior During Therapy: [facility] for tasks assessed/performed ?Overall Cognitive Status: Within Functional Limits for tasks assessed ?  ?  ?  ?  ?  ?  ?  ?  ?  ?  ?  ?  ?  ?  ?  ?  ?General Comments: pt with no family to compare, presents with slight self-limiting behavior due to fear of back pain ?  ?  ? ?  ?General Comments General comments (skin integrity, edema, etc.): VSS on RA. Pt demos some self-limiting behavior and at times it is difficult to discern weakness vs lack of effort due to fear of back pain. Benefits from PROM first then will attempt AROM. per NT, has done more with her than he showed on PT eval ? ?  ?Exercises General Exercises - Lower Extremity ?Quad Sets: AAROM, Both, 10 reps, Supine ?Heel Slides: AAROM, Both, 10 reps, Supine ?Hip Flexion/Marching: AAROM, Both, 10 reps, Supine  ? ?Assessment/Plan  ?  ?PT Assessment Patient needs continued PT services  ?PT Problem List Decreased strength;Decreased range of motion;Decreased balance;Decreased activity tolerance;Decreased mobility;Pain ? ?   ?  ?PT Treatment Interventions DME instruction;Gait training;Stair training;Functional mobility training;Therapeutic  activities;Therapeutic exercise;Balance training;Patient/family education   ? ?PT Goals (Current goals can be found in the Care Plan section)  ?Acute Rehab PT Goals ?Patient Stated Goal: reduce pain ?PT Goal Formulation: With patient ?Time For Goal Achievement: 01/28/22 ?Potential to Achieve Goals: Fair ? ?  ?Frequency Min 3X/week ?  ? ? ?   ?AM-PAC PT "6 Clicks" Mobility  ?Outcome Measure Help needed turning from your back to your side while in a flat bed without using bedrails?: A Lot ?Help needed moving from lying on your back to sitting on the side of a flat bed without using bedrails?: A Lot ?Help needed moving to and from a bed to a chair (including a wheelchair)?: Total ?Help needed standing up from a chair using your arms (e.g., wheelchair or bedside chair)?: Total ?Help needed to walk in hospital room?: Total ?Help needed climbing 3-5 steps with a railing? : Total ?6 Click Score: 8 ? ?  ?End of Session   ?Activity Tolerance: Patient limited by pain ?Patient left: in bed;with call bell/phone within reach;with bed alarm set ?Nurse Communication: Mobility status;Patient requests pain meds ?PT Visit Diagnosis: Pain;Unsteadiness on feet (R26.81);Repeated falls (R29.6) ?Pain - part of body:  (back) ?  ? ?Time: 2683-4196 ?PT Time Calculation (min) (ACUTE ONLY): 34 min ? ? ?Charges:   PT Evaluation ?$PT Eval Low Complexity: 1 Low ?PT Treatments ?$  Therapeutic Exercise: 8-22 mins ?  ?   ? ? ?West Carbo, PT, DPT  ? ?Acute Rehabilitation Department ?Pager #: [phone] - [phone] ? ?Sandra Cockayne ?01/14/2022, 11:08 AM ? ?

## 2022-01-14 NOTE — Assessment & Plan Note (Signed)
-   We will continue statin therapy. 

## 2022-01-14 NOTE — Assessment & Plan Note (Addendum)
HD- T/T/S ?Nephrology consulted ?

## 2022-01-14 NOTE — Progress Notes (Signed)
PROGRESS NOTE  PAT SIRES JQZ:[id number] DOB: 1961/04/02 DOA: 01/13/2022 PCP: [name], MD  HPI/Recap of past 24 hours: Alexander [name] is a 61 y.o. male with medical history significant for ESRD on HD on TTS, HTN, HLD, PVD status post right BKA and left midfoot amputation, Afib on Eliquis, CAD, DM2, and status post pacemaker, presented to the ER with worsening intractable low back pain over the last couple of days with associated left lower extremity radiation and radiculopathy with associated spasm and weakness, denies any urinary or stool incontinence. He has been having chronic low back pain with occasional radiation to both lower extremities. Pt has been followed by spine surgery and was scheduled for LS spine MRI on 02/07/2022.  He was noted to have significant edema with weeping in both lower extremities with recent Ortho follow-up status post his amputation (Dr [name]).  This was managed with compression wraps and close follow up was scheduled on 01/15/22.  Reported falls x2 on 01/13/2022, PTA and noted LLE weakness since that time.  In the ED, BP 104/65 otherwise vital signs. Labs at baseline.  Admitted for further management.     Today, patient continues to complain of intermittent back pain/spasm radiating down to his left lower extremity, denies any incontinence, chest pain, shortness of breath, abdominal pain, nausea/vomiting, fever/chills.    Assessment/Plan: Principal Problem:   Lumbar back pain with radiculopathy affecting left lower extremity Active Problems:   ESRD (end stage renal disease) (HCC)   PAF (paroxysmal atrial fibrillation) (HCC)   Essential hypertension, benign   Dyslipidemia   Type 2 diabetes mellitus with chronic kidney disease, with long-term current use of insulin (HCC)   GERD without esophagitis   Pressure injury of skin   Intermittent lumbar back pain/spasm with radiculopathy affecting LLE Acute on chronic back pain Reports intermittent  spasms/pain CT noted moderate neuroforaminal stenosis at L2-L3 and L5-S1 Reported being followed by spine surgery and was scheduled to have MRI later this month, will consult them once MRI is done LS-spine MRI cannot be performed till 01/15/2022 given his pacemaker. Pain management PT/OT- recommending SNF  History of PVD s/p R BKA and left midfoot amputation At outpatient visit on 01/10/2022 with orthopedics, noted BLE with significant weeping, edema with induration, both noted with distal maceration with L LE noted open ulceration Concern for ischemic changes, placed in Unna and Dynaflex compression wraps bilaterally Consult [name] on 01/15/2022  Leukocytosis Currently afebrile BC x2 pending Low threshold to start antibiotics, but will hold off until BLE is reevaluated on 01/15/2022, as of 01/10/2022, no suspected infection while examining at outpatient Trend WBC  Thrombocytopenia Chronic Daily CBC   ESRD ([place]) HD- T/T/S Plan to consult nephrology  Anemia of ESRD Baseline hemoglobin between 8-9, currently at baseline Daily CBC   PAF (paroxysmal atrial fibrillation) ([place]) Continue Lopressor and Eliquis  Type 2 diabetes mellitus with chronic kidney disease, with long-term current use of insulin (HCC) Last A1c 6.8 SSI, NovoLog 70/30 twice daily, hypoglycemic protocol, Accu-Cheks   Essential hypertension, benign Continue Lopressor   HLD Continue statin therapy  GERD without esophagitis Continue PPI therapy.     Estimated body mass index is 27.93 kg/m as calculated from the following:   Height as of this encounter: 6' (1.829 m).   Weight as of this encounter: 93.4 kg.     Code Status: Full  Family Communication: None at bedside  Disposition Plan: Status is: Inpatient Remains inpatient appropriate because: Level of care  Consultants: None  Procedures: None  Antimicrobials: None  DVT prophylaxis: Eliquis   Objective: Vitals:   01/14/22 0206  01/14/22 0317 01/14/22 0959 01/14/22 1125  BP: 101/67 101/66 108/68   Pulse: 97 83 64 68  Resp: '19 19  18  '$ Temp:  (!) 97.5 F (36.4 C)  98 F (36.7 C)  TempSrc:  Oral  Oral  SpO2: 96%   98%  Weight:  93.4 kg    Height:  6' (1.829 m)      Intake/Output Summary (Last 24 hours) at 01/14/2022 1418 Last data filed at 01/14/2022 0600 Gross per 24 hour  Intake 0 ml  Output 0 ml  Net 0 ml   Filed Weights   01/13/22 2134 01/14/22 0317  Weight: 107.5 kg 93.4 kg    Exam: General: NAD, poor dentition Cardiovascular: S1, S2 present Respiratory: CTAB Abdomen: Soft, nontender, nondistended, bowel sounds present Musculoskeletal: Noted right BKA, left midfoot amputation boot dressed in Unna wraps Skin: As mentioned above Psychiatry: Normal mood  Neurology: Limited due to pain, no obvious extremity weakness noted   Data Reviewed: CBC: Recent Labs  Lab 01/13/22 1505 01/14/22 0415  WBC 9.2 12.8*  NEUTROABS 6.7  --   HGB 10.9* 11.0*  HCT 36.0* 35.2*  MCV 100.6* 100.0  PLT PLATELET CLUMPS NOTED ON SMEAR, UNABLE TO ESTIMATE 426*   Basic Metabolic Panel: Recent Labs  Lab 01/13/22 1505 01/14/22 0415  NA 135 135  K 3.7 3.9  CL 96* 96*  CO2 28 26  GLUCOSE 157* 153*  BUN 14 17  CREATININE 2.03* 2.58*  CALCIUM 8.4* 8.4*  MG 1.7  --    GFR: Estimated Creatinine Clearance: 34.8 mL/min (A) (by C-G formula based on SCr of 2.58 mg/dL (H)). Liver Function Tests: Recent Labs  Lab 01/13/22 1505  AST 25  ALT 19  ALKPHOS 91  BILITOT 0.9  PROT 5.5*  ALBUMIN 2.4*   No results for input(s): LIPASE, AMYLASE in the last 168 hours. No results for input(s): AMMONIA in the last 168 hours. Coagulation Profile: No results for input(s): INR, PROTIME in the last 168 hours. Cardiac Enzymes: No results for input(s): CKTOTAL, CKMB, CKMBINDEX, TROPONINI in the last 168 hours. BNP (last 3 results) No results for input(s): PROBNP in the last 8760 hours. HbA1C: No results for input(s): HGBA1C  in the last 72 hours. CBG: Recent Labs  Lab 01/14/22 0915 01/14/22 1132  GLUCAP 113* 142*   Lipid Profile: No results for input(s): CHOL, HDL, LDLCALC, TRIG, CHOLHDL, LDLDIRECT in the last 72 hours. Thyroid Function Tests: No results for input(s): TSH, T4TOTAL, FREET4, T3FREE, THYROIDAB in the last 72 hours. Anemia Panel: No results for input(s): VITAMINB12, FOLATE, FERRITIN, TIBC, IRON, RETICCTPCT in the last 72 hours. Urine analysis:    Component Value Date/Time   COLORURINE YELLOW 05/23/2020 2057   APPEARANCEUR CLEAR 05/23/2020 2057   LABSPEC 1.008 05/23/2020 2057   PHURINE 5.0 05/23/2020 2057   GLUCOSEU 50 (A) 05/23/2020 2057   HGBUR LARGE (A) 05/23/2020 2057   BILIRUBINUR NEGATIVE 05/23/2020 2057   BILIRUBINUR neg 10/01/2014 1445   KETONESUR NEGATIVE 05/23/2020 2057   PROTEINUR 100 (A) 05/23/2020 2057   UROBILINOGEN 0.2 10/01/2014 1445   UROBILINOGEN 0.2 09/30/2014 2051   NITRITE NEGATIVE 05/23/2020 2057   LEUKOCYTESUR NEGATIVE 05/23/2020 2057   Sepsis Labs: '@LABRCNTIP'$ (procalcitonin:4,lacticidven:4)  ) Recent Results (from the past 240 hour(s))  Resp Panel by RT-PCR (Flu A&B, Covid) Nasopharyngeal Swab     Status: None   Collection Time:  01/14/22  1:13 AM   Specimen: Nasopharyngeal Swab; Nasopharyngeal(NP) swabs in vial transport medium  Result Value Ref Range Status   SARS Coronavirus 2 by RT PCR NEGATIVE NEGATIVE Final    Comment: (NOTE) SARS-CoV-2 target nucleic acids are NOT DETECTED.  The SARS-CoV-2 RNA is generally detectable in upper respiratory specimens during the acute phase of infection. The lowest concentration of SARS-CoV-2 viral copies this assay can detect is 138 copies/mL. A negative result does not preclude SARS-Cov-2 infection and should not be used as the sole basis for treatment or other patient management decisions. A negative result may occur with  improper specimen collection/handling, submission of specimen other than nasopharyngeal  swab, presence of viral mutation(s) within the areas targeted by this assay, and inadequate number of viral copies(<138 copies/mL). A negative result must be combined with clinical observations, patient history, and epidemiological information. The expected result is Negative.  Fact Sheet for Patients:  EntrepreneurPulse.com.au  Fact Sheet for Healthcare Providers:  IncredibleEmployment.be  This test is no t yet approved or cleared by the [place] FDA and  has been authorized for detection and/or diagnosis of SARS-CoV-2 by FDA under an Emergency Use Authorization (EUA). This EUA will remain  in effect (meaning this test can be used) for the duration of the COVID-19 declaration under Section 564(b)(1) of the Act, 21 U.S.C.section 360bbb-3(b)(1), unless the authorization is terminated  or revoked sooner.       Influenza A by PCR NEGATIVE NEGATIVE Final   Influenza B by PCR NEGATIVE NEGATIVE Final    Comment: (NOTE) The Xpert Xpress SARS-CoV-2/FLU/RSV plus assay is intended as an aid in the diagnosis of influenza from Nasopharyngeal swab specimens and should not be used as a sole basis for treatment. Nasal washings and aspirates are unacceptable for Xpert Xpress SARS-CoV-2/FLU/RSV testing.  Fact Sheet for Patients: EntrepreneurPulse.com.au  Fact Sheet for Healthcare Providers: IncredibleEmployment.be  This test is not yet approved or cleared by the [place] FDA and has been authorized for detection and/or diagnosis of SARS-CoV-2 by FDA under an Emergency Use Authorization (EUA). This EUA will remain in effect (meaning this test can be used) for the duration of the COVID-19 declaration under Section 564(b)(1) of the Act, 21 U.S.C. section 360bbb-3(b)(1), unless the authorization is terminated or revoked.  Performed at [facility], [place] [address] [place], [place] [address]   MRSA Next  Gen by PCR, Nasal     Status: None   Collection Time: 01/14/22  7:18 AM   Specimen: Nasal Mucosa; Nasal Swab  Result Value Ref Range Status   MRSA by PCR Next Gen NOT DETECTED NOT DETECTED Final    Comment: (NOTE) The GeneXpert MRSA Assay (FDA approved for NASAL specimens only), is one component of a comprehensive MRSA colonization surveillance program. It is not intended to diagnose MRSA infection nor to guide or monitor treatment for MRSA infections. Test performance is not FDA approved in patients less than 58 years old. Performed at [facility], [place] [address], [place],  [address]       Studies: CT Lumbar Spine Wo Contrast  Result Date: 01/13/2022 CLINICAL DATA:  Low back pain EXAM: CT LUMBAR SPINE WITHOUT CONTRAST TECHNIQUE: Multidetector CT imaging of the lumbar spine was performed without intravenous contrast administration. Multiplanar CT image reconstructions were also generated. RADIATION DOSE REDUCTION: This exam was performed according to the departmental dose-optimization program which includes automated exposure control, adjustment of the mA and/or kV according to patient size and/or use  of iterative reconstruction technique. COMPARISON:  10/28/2021 FINDINGS: Segmentation: 5 lumbar type vertebrae. Alignment: Normal. Vertebrae: There are bilateral laminectomies at L3 and L4. There is progressive increase in size of inferior L2 and superior L3 endplate deformities. Gas in the disc space argues against infection. No acute fracture. Paraspinal and other soft tissues: Calcific aortic atherosclerosis. Disc levels: T12-L1: Unremarkable. L1-2: No spinal canal stenosis. L2-3: Progression of endplate changes. Large disc bulge with osteophytes. Moderate spinal canal and bilateral neural foraminal stenosis. L3-4: Posterior decompression without spinal canal stenosis or neural impingement. L4-5: Small left subarticular disc protrusion without stenosis. L5-S1: Disc bulge and endplate  spurring with moderate bilateral foraminal stenosis. No spinal canal stenosis. IMPRESSION: 1. Progressive increase in size of inferior L2 and superior L3 endplate deformities, likely degenerative. Gas in the disc space argues against infection. 2. Moderate spinal canal and bilateral neural foraminal stenosis at L2-3. 3. Moderate bilateral L5-S1 foraminal stenosis. Aortic Atherosclerosis (ICD10-I70.0). Electronically Signed   By: Ulyses Jarred M.D.   On: 01/13/2022 23:15    Scheduled Meds:  apixaban  5 mg Oral BID   atorvastatin  20 mg Oral QHS   Chlorhexidine Gluconate Cloth  6 each Topical Daily   furosemide  80 mg Oral Once per day on Sun Mon Wed Fri   insulin aspart  0-6 Units Subcutaneous TID WC   insulin aspart protamine- aspart  16 Units Subcutaneous BID WC   lidocaine  1 patch Transdermal Q24H   metoprolol tartrate  25 mg Oral BID   [START ON 01/15/2022] multivitamin with minerals  1 tablet Oral Once per day on Mon Thu   pantoprazole  40 mg Oral Daily    Continuous Infusions:   LOS: 1 day     Alma Friendly, MD [facility]  If 7PM-7AM, please contact night-coverage [email or web address] 01/14/2022, 2:18 PM

## 2022-01-14 NOTE — Assessment & Plan Note (Addendum)
Continue PPI therapy. 

## 2022-01-14 NOTE — Progress Notes (Signed)
New Admission Note:  ? ?Arrival Method: Arrived from [facility] ED via stretcher ?Mental Orientation: Alert and oriented x4 ?Telemetry: N/A ?Assessment: Completed ?Skin: See doc flowsheet ?IV: NSL-Lt AC ?Pain: 9/10 ?Tubes: N/A ?Safety Measures: Safety Fall Prevention Plan has been discussed.  ?Admission: Completed ?5MW Orientation: Patient has been oriented to the room, unit and staff.  ?Family: None at bedside ? ?Orders have been reviewed and implemented. Will continue to monitor the patient. Call light has been placed within reach and bed alarm has been activated.  ? ?Interior and spatial designer, RN-BC ?Phone number: [phone]  ?

## 2022-01-14 NOTE — Assessment & Plan Note (Addendum)
Continue Lopressor- holding parameters ?

## 2022-01-15 ENCOUNTER — Ambulatory Visit: Payer: Medicare Other | Admitting: Orthopedic Surgery

## 2022-01-15 ENCOUNTER — Inpatient Hospital Stay (HOSPITAL_COMMUNITY): Payer: Medicare Other

## 2022-01-15 DIAGNOSIS — I10 Essential (primary) hypertension: Secondary | ICD-10-CM | POA: Diagnosis not present

## 2022-01-15 DIAGNOSIS — E785 Hyperlipidemia, unspecified: Secondary | ICD-10-CM | POA: Diagnosis not present

## 2022-01-15 DIAGNOSIS — M5416 Radiculopathy, lumbar region: Secondary | ICD-10-CM | POA: Diagnosis not present

## 2022-01-15 DIAGNOSIS — N186 End stage renal disease: Secondary | ICD-10-CM | POA: Diagnosis not present

## 2022-01-15 LAB — CBC WITH DIFFERENTIAL/PLATELET
Abs Immature Granulocytes: 0.06 10*3/uL (ref 0.00–0.07)
Basophils Absolute: 0 10*3/uL (ref 0.0–0.1)
Basophils Relative: 0 %
Eosinophils Absolute: 0 10*3/uL (ref 0.0–0.5)
Eosinophils Relative: 0 %
HCT: 30.7 % — ABNORMAL LOW (ref 39.0–52.0)
Hemoglobin: 9.5 g/dL — ABNORMAL LOW (ref 13.0–17.0)
Immature Granulocytes: 1 %
Lymphocytes Relative: 21 %
Lymphs Abs: 2.1 10*3/uL (ref 0.7–4.0)
MCH: 30.7 pg (ref 26.0–34.0)
MCHC: 30.9 g/dL (ref 30.0–36.0)
MCV: 99.4 fL (ref 80.0–100.0)
Monocytes Absolute: 0.7 10*3/uL (ref 0.1–1.0)
Monocytes Relative: 7 %
Neutro Abs: 7 10*3/uL (ref 1.7–7.7)
Neutrophils Relative %: 71 %
Platelets: 99 10*3/uL — ABNORMAL LOW (ref 150–400)
RBC: 3.09 MIL/uL — ABNORMAL LOW (ref 4.22–5.81)
RDW: 18.9 % — ABNORMAL HIGH (ref 11.5–15.5)
WBC: 9.9 10*3/uL (ref 4.0–10.5)
nRBC: 0.7 % — ABNORMAL HIGH (ref 0.0–0.2)

## 2022-01-15 LAB — BASIC METABOLIC PANEL
Anion gap: 11 (ref 5–15)
BUN: 24 mg/dL — ABNORMAL HIGH (ref 8–23)
CO2: 25 mmol/L (ref 22–32)
Calcium: 8.4 mg/dL — ABNORMAL LOW (ref 8.9–10.3)
Chloride: 97 mmol/L — ABNORMAL LOW (ref 98–111)
Creatinine, Ser: 3.3 mg/dL — ABNORMAL HIGH (ref 0.61–1.24)
GFR, Estimated: 20 mL/min — ABNORMAL LOW (ref >60)
Glucose, Bld: 145 mg/dL — ABNORMAL HIGH (ref 70–99)
Potassium: 4.1 mmol/L (ref 3.5–5.1)
Sodium: 133 mmol/L — ABNORMAL LOW (ref 135–145)

## 2022-01-15 LAB — GLUCOSE, CAPILLARY
Glucose-Capillary: 144 mg/dL — ABNORMAL HIGH (ref 70–99)
Glucose-Capillary: 137 mg/dL — ABNORMAL HIGH (ref 70–99)
Glucose-Capillary: 135 mg/dL — ABNORMAL HIGH (ref 70–99)
Glucose-Capillary: 119 mg/dL — ABNORMAL HIGH (ref 70–99)

## 2022-01-15 LAB — SEDIMENTATION RATE: Sed Rate: 21 mm/hr — ABNORMAL HIGH (ref 0–16)

## 2022-01-15 LAB — C-REACTIVE PROTEIN: CRP: 14 mg/dL — ABNORMAL HIGH (ref <1.0)

## 2022-01-15 MED ORDER — SODIUM CHLORIDE 0.9 % IV SOLN: 100.0000 mL | INTRAVENOUS | Status: DC | PRN

## 2022-01-15 MED ORDER — DEXAMETHASONE SODIUM PHOSPHATE 10 MG/ML IJ SOLN
10.0000 mg | Freq: Four times a day (QID) | INTRAMUSCULAR | Status: DC
  Administered 2022-01-15 – 2022-01-16 (×3): 10 mg via INTRAVENOUS
  Filled 2022-01-15 (×5): qty 1

## 2022-01-15 MED ORDER — LIDOCAINE HCL (PF) 1 % IJ SOLN: 5.0000 mL | INTRAMUSCULAR | Status: DC | PRN

## 2022-01-15 MED ORDER — ALTEPLASE 2 MG IJ SOLR: 2.0000 mg | Freq: Once | INTRAMUSCULAR | Status: DC | PRN

## 2022-01-15 MED ORDER — LORAZEPAM 2 MG/ML IJ SOLN
1.0000 mg | Freq: Once | INTRAMUSCULAR | Status: AC
  Administered 2022-01-15: 1 mg via INTRAVENOUS
  Filled 2022-01-15: qty 1

## 2022-01-15 MED ORDER — HEPARIN SODIUM (PORCINE) 1000 UNIT/ML DIALYSIS: 1000.0000 [IU] | INTRAMUSCULAR | Status: DC | PRN

## 2022-01-15 MED ORDER — CALCITRIOL 0.25 MCG PO CAPS
0.2500 ug | ORAL_CAPSULE | Freq: Every day | ORAL | Status: DC
  Administered 2022-01-15 – 2022-02-14 (×29): 0.25 ug via ORAL
  Filled 2022-01-15 (×31): qty 1

## 2022-01-15 MED ORDER — PENTAFLUOROPROP-TETRAFLUOROETH EX AERO: 1.0000 "application " | INHALATION_SPRAY | CUTANEOUS | Status: DC | PRN

## 2022-01-15 NOTE — TOC Initial Note (Signed)
Transition of Care (TOC) - Initial/Assessment Note  ? ? ?Patient Details  ?Name: Alexander [name] ?MRN: [id number] ?Date of Birth: May 21, 1961 ? ?Transition of Care (TOC) CM/SW Contact:    ?Coralee Pesa, LCSWA ?Phone Number: ?01/15/2022, 1:31 PM ? ?Clinical Narrative:                 ?CSW met with pt at bedside to discuss PT recommendation for SNF. Pt states he is not interested in SNF, and will not need HH at [place]. Pt states he has a spouse and two adult children at home to assist and has DME. Pt states he has an outpatient Education officer, museum as well. TOC will be available if any further needs arise. ? ?Expected Discharge Plan: Home/Self Care ?Barriers to Discharge: Continued Medical Work up ? ? ?Patient Goals and CMS Choice ?Patient states their goals for this hospitalization and ongoing recovery are:: Pt states he would like to return home after hospitalization. ?CMS Medicare.gov Compare Post Acute Care list provided to:: Patient ?Choice offered to / list presented to : Patient ? ?Expected Discharge Plan and Services ?Expected Discharge Plan: Home/Self Care ?  ?  ?Post Acute Care Choice: Resumption of Svcs/PTA Provider ?Living arrangements for the past 2 months: [place] ?                ?  ?  ?  ?  ?  ?  ?  ?  ?  ?  ? ?Prior Living Arrangements/Services ?Living arrangements for the past 2 months: [place] ?Lives with:: Adult Children, Spouse ?Patient language and need for interpreter reviewed:: Yes ?Do you feel safe going back to the place where you live?: Yes      ?Need for Family Participation in Patient Care: Yes (Comment) ?Care giver support system in place?: Yes (comment) ?Current home services: DME ?Criminal Activity/Legal Involvement Pertinent to Current Situation/Hospitalization: No - Comment as needed ? ?Activities of Daily Living ?Home Assistive Devices/Equipment: CBG Meter ?ADL Screening (condition at time of admission) ?Patient's cognitive ability adequate to safely complete daily activities?:  Yes ?Is the patient deaf or have difficulty hearing?: No ?Does the patient have difficulty seeing, even when wearing glasses/contacts?: No ?Does the patient have difficulty concentrating, remembering, or making decisions?: No ?Patient able to express need for assistance with ADLs?: Yes ?Does the patient have difficulty dressing or bathing?: Yes ?Independently performs ADLs?: No ?Communication: Independent ?Dressing (OT): Needs assistance ?Is this a change from baseline?: Pre-admission baseline ?Grooming: Independent ?Feeding: Independent ?Bathing: Needs assistance ?Is this a change from baseline?: Change from baseline, expected to last >3 days ?Toileting: Needs assistance ?Is this a change from baseline?: Pre-admission baseline ?In/Out Bed: Needs assistance ?Is this a change from baseline?: Pre-admission baseline ?Does the patient have difficulty walking or climbing stairs?: Yes ?Weakness of Legs: Both ?Weakness of Arms/Hands: Both ? ?Permission Sought/Granted ?Permission sought to share information with : Family Supports ?Permission granted to share information with : No ?   ?   ?   ?   ? ?Emotional Assessment ?Appearance:: Appears stated age ?Attitude/Demeanor/Rapport: Engaged ?Affect (typically observed): Appropriate ?Orientation: : Oriented to Self, Oriented to Place, Oriented to  Time, Oriented to Situation ?Alcohol / Substance Use: Not Applicable ?Psych Involvement: No (comment) ? ?Admission diagnosis:  Intractable back pain [M54.9] ?Patient Active Problem List  ? Diagnosis Date Noted  ? Lumbar back pain with radiculopathy affecting left lower extremity 01/14/2022  ? Dyslipidemia 01/14/2022  ? Type 2 diabetes mellitus with chronic  kidney disease, with long-term current use of insulin ([place]) 01/14/2022  ? GERD without esophagitis 01/14/2022  ? Pressure injury of skin 01/14/2022  ? Intractable back pain 01/13/2022  ? Blister 11/08/2021  ? Malnutrition of moderate degree 11/02/2021  ? Pacemaker infection ([place])   ?  Transaminitis   ? Tricuspid valve vegetation   ? Thrombocytopenia ([place]) 10/26/2021  ? Hyponatremia 10/26/2021  ? Acute on chronic diastolic CHF (congestive heart failure) ([place]) 10/26/2021  ? Atrial fibrillation with RVR ([place]) 10/25/2021  ? Symptomatic anemia 09/30/2021  ? ESRD (end stage renal disease) ([place]) 09/30/2021  ? PAF (paroxysmal atrial fibrillation) ([place]) 09/30/2021  ? NSVT (nonsustained ventricular tachycardia)   ? Pancytopenia ([place])   ? Acute blood loss anemia 07/18/2020  ? Hemodialysis patient ([place]) 06/04/2020  ? Necrotizing glomerulonephritis 06/04/2020  ? Heme positive stool   ? Melena   ? Upper GI bleeding 05/23/2020  ? Acute GI bleeding 05/23/2020  ? Acute hematogenous osteomyelitis of right foot ([place]) 03/11/2020  ? Gangrene of right foot ([place])   ? Diabetic foot ulcer associated with type 2 diabetes mellitus ([place]) 02/19/2020  ? Heel ulcer ([place]) 11/05/2019  ? Atherosclerotic PVD with ulceration ([place]) 09/09/2017  ? Essential hypertension, benign 02/07/2015  ? Renal insufficiency 02/07/2015  ? Post PTCA 11/09/2014  ? S/P PTCA (percutaneous transluminal coronary angioplasty) 11/09/2014  ? Pacemaker: [place] DR MRI [id number] Dual chamber pacemaker 10/08/2014 10/08/2014  ? Encounter for care of pacemaker 10/08/2014  ? Sinus node dysfunction ([place]) 10/08/2014  ? Cardiac asystole ([place]) 10/07/2014  ? Cardiac syncope 10/07/2014  ? Diabetes mellitus with stage 3 chronic kidney disease ([place]) 10/07/2014  ? CAD (coronary artery disease), native coronary artery 10/07/2014  ? Diabetes mellitus due to underlying condition without complications ([place]) 93/57/0177  ? Anemia, unspecified 05/25/2014  ? Syncope 04/30/2014  ? Atherosclerosis of native arteries of the extremities with gangrene ([place]) 12/16/2013  ? Volume overload 10/30/2013  ? Acute renal failure ([place]) 10/28/2013  ? PVD (peripheral vascular disease) ([place]) 10/27/2013  ? Leukocytosis 10/21/2013  ? Anemia 10/21/2013  ? Osteomyelitis of left foot ([place])  08/19/2013  ? Spinal stenosis, lumbar region, with neurogenic claudication 06/12/2012  ?  Class: Diagnosis of  ? Diabetes mellitus with end stage renal disease ([place]) 03/15/2009  ? ALCOHOL ABUSE 03/15/2009  ? TOBACCO USE 03/15/2009  ? HYPERTENSION 03/15/2009  ? ?PCP:  [name], MD ?Pharmacy:   ?[place], [address] ?[place] [address] ?Phone: [phone] Fax: [phone] ? ?[id number] [place], [place] - [address]?Phone: [phone]: [phone]? ?[facility] ?[address]?Phone: [phone]: [phone] ? ? ? ? ?Social Determinants of Health (SDOH) Interventions ?  ? ?Readmission Risk Interventions ?Readmission Risk Prevention Plan 10/27/2021 05/26/2020 03/15/2020  ?Transportation Screening Complete Complete Complete  ?PCP or Specialist Appt within 5-7 Days - - Complete  ?PCP or Specialist Appt within 3-5 Days Complete - -  ?Home Care Screening - - Complete  ?Medication Review (RN CM) - - Complete  ?[place] Home Care Consult Complete - -  ?Social Work Consult for RFort DrumPlanning/Counseling Complete - -  ?Palliative Care Screening Not Applicable - -  ?Medication Review (Press photographer Complete Complete -  ?PCP or Specialist appointment within 3-5 days of discharge - Complete -  ?[place]- Complete -  ?  SW Recovery Care/Counseling Consult - Complete -  ?Palliative Care Screening - Not Applicable -  ?Skilled Nursing Facility - Not Applicable -  ?Some recent data might be hidden  ? ? ? ?

## 2022-01-15 NOTE — Progress Notes (Signed)
Patient to MRI from [phone] for lumbar wo contrast. Patient has medtronic device. CLE sent. Orders for VOO 100. Patient is tachycardic at baseline and going in and out of pacing. Will re-program once scan is completed.  ?

## 2022-01-15 NOTE — Consult Note (Signed)
[facility] Renal Consultation Note    Indication for Consultation:  Management of ESRD/hemodialysis; anemia, hypertension/volume and secondary hyperparathyroidism  [id number], [name], MD  HPI: Alexander [name] is a 61 y.o. male with ESRD on HD TTS at [facility]. His past medical history is significant for HTN, HLD, PVD status post right BKA and left midfoot amputation, Afib on Eliquis, CAD, DM2, and status post pacemaker.  Patient presented to the ED c/o lower back pain, radiating down to both LLE. He received full HD treatment at his outpatient dialysis center on 01/13/22. Reviewed records in [place]. Apparently he suffered 2 falls on 3/4. He is followed by Spine Surgery and MR spine completed today. Seen and examined patient at bedside. He reports difficulty moving both legs. Denies SOB, CP, and N/V. Current labs and VS are relatively stable. Noted WBC trending down. Plan for HD 01/16/22.  Past Medical History:  Diagnosis Date   Acute blood loss anemia    Alcohol abuse    Anemia    Anemia of chronic disease    Arthritis    "patient does not think so."   Atrial fibrillation ([place])    Cardiac syncope 10/07/2014   rapid A fib with 8 sec pauses on converison with syncope- temp pacing wire placed then PPM   Cataract    BILATERAL   Coronary artery disease    Diabetes mellitus    dx---been  awhile   Elevated LFTs    Endocarditis of tricuspid valve    ESRD (end stage renal disease) ([place])    [place]   GERD (gastroesophageal reflux disease)    History of blood transfusion    Hyperlipemia    Hypertension    Malnutrition ([place])    Osteomyelitis ([place])    right foot   Peripheral vascular disease ([place])    Presence of permanent cardiac pacemaker 10/08/2014   [employer]   Tricuspid valve vegetation    Past Surgical History:  Procedure Laterality Date   A/V FISTULAGRAM Right 05/01/2021   Procedure: A/V FISTULAGRAM;  Surgeon: Waynetta Sandy, MD;  Location:  [place] [facility];  Service: Cardiovascular;  Laterality: Right;  failed fistulagram   ABDOMINAL AORTOGRAM W/LOWER EXTREMITY N/A 09/09/2017   Procedure: ABDOMINAL AORTOGRAM W/LOWER EXTREMITY;  Surgeon: Angelia Mould, MD;  Location: [place] [facility];  Service: Cardiovascular;  Laterality: N/A;   ABDOMINAL AORTOGRAM W/LOWER EXTREMITY Right 11/11/2019   Procedure: ABDOMINAL AORTOGRAM W/LOWER EXTREMITY;  Surgeon: Marty Heck, MD;  Location: [place] [facility];  Service: Cardiovascular;  Laterality: Right;   AMPUTATION Left 08/19/2013   Procedure: AMPUTATION RAY;  Surgeon: Alta Corning, MD;  Location: [place];  Service: Orthopedics;  Laterality: Left;  ray amputation left 5th   AMPUTATION Left 10/27/2013   Procedure: AMPUTATION DIGIT-LEFT 4TH TOE, 4th and 5th metatarsal.;  Surgeon: Angelia Mould, MD;  Location: [place];  Service: Vascular;  Laterality: Left;   AMPUTATION Left 11/04/2013   Procedure: LEFT FOOT TRANS-METATARSAL AMPUTATION WITH WOUND CLOSURE ;  Surgeon: Alta Corning, MD;  Location: [place];  Service: Orthopedics;  Laterality: Left;   AMPUTATION Right 09/10/2017   Procedure: RIGHT FOURTH AND FIFTH TOE AMPUTATION;  Surgeon: Angelia Mould, MD;  Location: [place];  Service: Vascular;  Laterality: Right;   AMPUTATION Right 02/19/2020   Procedure: AMPUTATION RIGHT 3RD TOE;  Surgeon: Angelia Mould, MD;  Location: [place];  Service: Vascular;  Laterality: Right;   AMPUTATION Right 03/11/2020  Procedure: RIGHT BELOW KNEE AMPUTATION;  Surgeon: Newt Minion, MD;  Location: [place];  Service: Orthopedics;  Laterality: Right;   APPLICATION OF WOUND VAC Right 02/19/2020   Procedure: Application Of Wound Vac Right foot;  Surgeon: Angelia Mould, MD;  Location: [place];  Service: Vascular;  Laterality: Right;   AV FISTULA PLACEMENT Right 03/27/2021   Procedure: RIGHT ARM ARTERIOVENOUS (AV) FISTULA;  Surgeon: Angelia Mould, MD;  Location: [place];   Service: Vascular;  Laterality: Right;   AV FISTULA PLACEMENT Right 05/10/2021   Procedure: RIGHT ARM ARTERIOVENOUS (AV) FISTULA;  Surgeon: Waynetta Sandy, MD;  Location: [place];  Service: Vascular;  Laterality: Right;   [place] Right 08/11/2021   Procedure: RIGHT ARM SECOND STAGE BASILIC VEIN TRANSPOSITION;  Surgeon: Waynetta Sandy, MD;  Location: [place];  Service: Vascular;  Laterality: Right;   BIOPSY  07/20/2020   Procedure: BIOPSY;  Surgeon: Irving Copas., MD;  Location: [place];  Service: Gastroenterology;;   CARDIAC CATHETERIZATION  10/07/2014   Procedure: LEFT HEART CATH AND CORONARY ANGIOGRAPHY;  Surgeon: Laverda Page, MD;  Location: [facility] CATH LAB;  Service: Cardiovascular;;   COLONOSCOPY W/ POLYPECTOMY     COLONOSCOPY WITH PROPOFOL N/A 05/25/2020   Procedure: COLONOSCOPY WITH PROPOFOL;  Surgeon: Gatha Mayer, MD;  Location: [facility] ENDOS[facility];  Service: Endoscopy;  Laterality: N/A;   EMBOLECTOMY Right 09/12/2017   Procedure: Thrombectomy  & Redo Right Below Knee Popliteal Artery Bypass Graft  ;  Surgeon: Waynetta Sandy, MD;  Location: [facility] OR;  Service: Vascular;  Laterality: Right;   ENTEROSCOPY N/A 07/20/2020   Procedure: ENTEROSCOPY;  Surgeon: Rush Landmark Telford Nab., MD;  Location: [place];  Service: Gastroenterology;  Laterality: N/A;   ESOPHAGOGASTRODUODENOSCOPY (EGD) WITH PROPOFOL N/A 05/24/2020   Procedure: ESOPHAGOGASTRODUODENOSCOPY (EGD) WITH PROPOFOL;  Surgeon: Ladene Artist, MD;  Location: [facility];  Service: Endoscopy;  Laterality: N/A;   EYE SURGERY Bilateral    cataract   FEMORAL-TIBIAL BYPASS GRAFT Left 10/27/2013   Procedure: BYPASS GRAFT LEFT FEMORAL- POSTERIOR TIBIAL ARTERY;  Surgeon: Angelia Mould, MD;  Location: [place];  Service: Vascular;  Laterality: Left;   FEMORAL-TIBIAL BYPASS GRAFT Right 09/10/2017   Procedure: RIGHT SUPERFICIAL  FEMORAL ARTERY-BELOW KNEE  POPLITEAL ARTERY BYPASS GRAFT WITH VEIN;  Surgeon: Angelia Mould, MD;  Location: [place];  Service: Vascular;  Laterality: Right;   GIVENS CAPSULE STUDY N/A 07/20/2020   Procedure: GIVENS CAPSULE STUDY;  Surgeon: Irving Copas., MD;  Location: [place];  Service: Gastroenterology;  Laterality: N/A;  capsule placed through scope into duodenum @ 0936   I & D EXTREMITY Left 10/27/2013   Procedure: IRRIGATION AND DEBRIDEMENT EXTREMITY- LEFT FOOT;  Surgeon: Angelia Mould, MD;  Location: [place];  Service: Vascular;  Laterality: Left;   I & D EXTREMITY Right 01/26/2020   Procedure: IRRIGATION AND DEBRIDEMENT EXTREMITY RIGHT FOOT WOUND;  Surgeon: Angelia Mould, MD;  Location: [place];  Service: Vascular;  Laterality: Right;   INSERT / REPLACE / REMOVE PACEMAKER     INSERTION OF DIALYSIS CATHETER Right 11/03/2021   Procedure: RIGHT INTERNAL JUGULAR INSERTION OF TUNNELED DIALYSIS CATHETER;  Surgeon: Angelia Mould, MD;  Location: [place];  Service: Vascular;  Laterality: Right;   INTRAOPERATIVE ARTERIOGRAM Right 09/10/2017   Procedure: INTRA OPERATIVE ARTERIOGRAM;  Surgeon: Angelia Mould, MD;  Location: [place];  Service: Vascular;  Laterality: Right;   IR ANGIOGRAM FOLLOW  UP STUDY  09/09/2017   IR FLUORO GUIDE CV LINE RIGHT  05/31/2020   IR US GUIDE VASC ACCESS RIGHT  06/01/2020   LEFT HEART CATH AND CORONARY ANGIOGRAPHY N/A 08/19/2020   Procedure: LEFT HEART CATH AND CORONARY ANGIOGRAPHY;  Surgeon: Adrian Prows, MD;  Location: [place] [facility];  Service: Cardiovascular;  Laterality: N/A;   LEFT HEART CATHETERIZATION WITH CORONARY ANGIOGRAM N/A 11/09/2014   Procedure: LEFT HEART CATHETERIZATION WITH CORONARY ANGIOGRAM;  Surgeon: Laverda Page, MD;  Location: [facility] CATH LAB;  Service: Cardiovascular;  Laterality: N/A;   LOWER EXTREMITY ANGIOGRAM Right 11/08/2015   Procedure: Lower Extremity Angiogram;  Surgeon: Serafina Mitchell, MD;  Location: [place] [facility]  [facility];  Service: Cardiovascular;  Laterality: Right;   LUMBAR LAMINECTOMY  06/13/2012   Procedure: MICRODISCECTOMY LUMBAR LAMINECTOMY;  Surgeon: Jessy Oto, MD;  Location: [place];  Service: Orthopedics;  Laterality: N/A;  Central laminectomy L2-3, L3-4, L4-5   PACEMAKER INSERTION  10/08/2014   MDT Advisa MRI compatible dual chamber pacemaker implanted by Dr Caryl Comes for syncope with post-termination pauses   PERCUTANEOUS CORONARY STENT INTERVENTION (PCI-S)  11/09/2014   des to lad & distal circumflex         Dr  [name]   PERIPHERAL VASCULAR BALLOON ANGIOPLASTY Right 11/11/2019   Procedure: PERIPHERAL VASCULAR BALLOON ANGIOPLASTY;  Surgeon: Marty Heck, MD;  Location: [place] [facility];  Service: Cardiovascular;  Laterality: Right;  below knee popliteal, tibioperoneal trunk, posterior tibial arteries   PERIPHERAL VASCULAR CATHETERIZATION N/A 11/08/2015   Procedure: Abdominal Aortogram;  Surgeon: Serafina Mitchell, MD;  Location: [place] [facility];  Service: Cardiovascular;  Laterality: N/A;   PERIPHERAL VASCULAR CATHETERIZATION Right 11/08/2015   Procedure: Peripheral Vascular Atherectomy;  Surgeon: Serafina Mitchell, MD;  Location: [place] [facility];  Service: Cardiovascular;  Laterality: Right;   PERIPHERAL VASCULAR CATHETERIZATION N/A 10/10/2016   Procedure: Lower Extremity Angiography;  Surgeon: Waynetta Sandy, MD;  Location: [place] [facility];  Service: Cardiovascular;  Laterality: N/A;   PERIPHERAL VASCULAR CATHETERIZATION Right 10/10/2016   Procedure: Peripheral Vascular Atherectomy;  Surgeon: Waynetta Sandy, MD;  Location: [place] [facility];  Service: Cardiovascular;  Laterality: Right;  Popliteal   PERMANENT PACEMAKER INSERTION N/A 10/08/2014   Procedure: PERMANENT PACEMAKER INSERTION;  Surgeon: Deboraha Sprang, MD;  Location: [facility] CATH LAB;  Service: Cardiovascular;  Laterality: N/A;   SUBMUCOSAL TATTOO INJECTION  07/20/2020   Procedure: SUBMUCOSAL TATTOO  INJECTION;  Surgeon: Irving Copas., MD;  Location: [place];  Service: Gastroenterology;;   TEE WITHOUT CARDIOVERSION N/A 10/27/2021   Procedure: TRANSESOPHAGEAL ECHOCARDIOGRAM (TEE);  Surgeon: Adrian Prows, MD;  Location: [facility] ENDOSCOPY;  Service: Cardiovascular;  Laterality: N/A;   TEMPORARY PACEMAKER INSERTION Bilateral 10/07/2014   Procedure: TEMPORARY PACEMAKER INSERTION;  Surgeon: Laverda Page, MD;  Location: [facility] CATH LAB;  Service: Cardiovascular;  Laterality: Bilateral;   WOUND DEBRIDEMENT Right 02/19/2020   Procedure: DEBRIDEMENT WOUND RIGHT FOOT;  Surgeon: Angelia Mould, MD;  Location: [facility] OR;  Service: Vascular;  Laterality: Right;   Family History  Problem Relation Age of Onset   Diabetes type II Mother    Hypertension Mother    Diabetes Mother    Liver cancer Father    Diabetes type II Sister    Breast cancer Sister    Diabetes Sister    Hypertension Sister    Diabetes type II Brother    Kidney failure Brother    Diabetes Brother  Hypertension Brother    Diabetes type II Sister    Social History:  reports that he quit smoking about 7 years ago. His smoking use included cigarettes. He has a 45.00 pack-year smoking history. He has never used smokeless tobacco. He reports that he does not currently use alcohol after a past usage of about 21.0 standard drinks per week. He reports that he does not use drugs. Allergies  Allergen Reactions   Cytoxan [Cyclophosphamide] Other (See Comments)    Pancytopenia   Pletal [Cilostazol] Palpitations and Other (See Comments)    "Can hear heart beating loudly".   Keflex [Cephalexin] Nausea And Vomiting   Tylenol [Acetaminophen] Other (See Comments)    Patient has a cold sweat   Prior to Admission medications   Medication Sig Start Date End Date Taking? Authorizing Provider  amoxicillin (AMOXIL) 500 MG capsule Take 1 capsule (500 mg total) by mouth at bedtime. 11/22/21 11/17/22 Yes Vu, Rockey Situ, MD  apixaban  (ELIQUIS) 5 MG TABS tablet Take 1 tablet (5 mg total) by mouth 2 (two) times daily. 12/11/21 03/11/22 Yes Cantwell, Celeste C, PA-C  atorvastatin (LIPITOR) 20 MG tablet Take 1 tablet (20 mg total) by mouth at bedtime. 10/01/21  Yes Aline August, MD  doxycycline (VIBRA-TABS) 100 MG tablet Take 1 tablet (100 mg total) by mouth 2 (two) times daily. 11/22/21 11/17/22 Yes Vu, Rockey Situ, MD  furosemide (LASIX) 80 MG tablet Take 80 mg by mouth See admin instructions. Take 80 mg by mouth on (non-dialysis days) Mondays, Wednesdays, Fridays & Sundays in the morning as needed "to cause urination" 02/26/21  Yes [provider]  ibuprofen (ADVIL) 800 MG tablet Take 800 mg by mouth every 8 (eight) hours as needed for moderate pain.   Yes [provider]  lidocaine-prilocaine (EMLA) cream Apply 1 application topically every dialysis (on Tues/Thurs/Sat).   Yes [provider]  metoprolol tartrate (LOPRESSOR) 25 MG tablet Take 1 tablet (25 mg total) by mouth 2 (two) times daily. 05/24/21 05/19/22 Yes Adrian Prows, MD  Multiple Vitamin (MULTIVITAMIN WITH MINERALS) TABS tablet Take 1 tablet by mouth 2 (two) times a week.   Yes [provider]  NOVOLIN 70/30 (70-30) 100 UNIT/ML injection Inject 16 Units into the skin 2 (two) times daily with a meal. Patient taking differently: Inject 10-20 Units into the skin 2 (two) times daily as needed (high blood sugar). 11/06/21  Yes Patrecia Pour, MD  oxyCODONE-acetaminophen (PERCOCET/ROXICET) 5-325 MG tablet Take 1 tablet by mouth every 8 (eight) hours as needed for moderate pain or severe pain. 01/11/22  Yes Newt Minion, MD  pantoprazole (PROTONIX) 40 MG tablet Take 40 mg by mouth daily.   Yes [provider]  ACCU-CHEK FASTCLIX LANCETS MISC Check blood sugar TID & QHS 10/01/14   Advani, Vernon Prey, MD  blood glucose meter kit and supplies KIT Check blood sugar TID & QHS 02/10/15   Advani, Vernon Prey, MD  Blood Glucose Monitoring Suppl (ACCU-CHEK  ADVANTAGE DIABETES) kit Use as instructed 10/01/14   Lorayne Marek, MD  glucose blood (ACCU-CHEK AVIVA PLUS) test strip Use as instructed 10/01/14   Advani, Vernon Prey, MD  glucose blood (CHOICE DM FORA G20 TEST STRIPS) test strip Check blood sugar TID & QHS 02/10/15   Advani, Vernon Prey, MD  glucose blood test strip Use as instructed 11/20/13   Reyne Dumas, MD  methylPREDNISolone (MEDROL DOSEPAK) 4 MG TBPK tablet See admin instructions. 12/20/21   [provider]   Current Facility-Administered Medications  Medication Dose  Route Frequency Provider Last Rate Last Admin   apixaban (ELIQUIS) tablet 5 mg  5 mg Oral BID Mansy, Jan A, MD   5 mg at 01/15/22 0847   atorvastatin (LIPITOR) tablet 20 mg  20 mg Oral QHS Mansy, Jan A, MD   20 mg at 01/14/22 2143   Chlorhexidine Gluconate Cloth 2 % PADS 6 each  6 each Topical Daily Alma Friendly, MD   6 each at 01/14/22 1140   dexamethasone (DECADRON) injection 10 mg  10 mg Intravenous Q6H Alma Friendly, MD       furosemide (LASIX) tablet 80 mg  80 mg Oral Once per day on Sun Mon Wed Fri Mansy, Jan A, MD   80 mg at 01/15/22 0847   HYDROmorphone (DILAUDID) injection 1 mg  1 mg Intravenous Q4H PRN Mansy, Jan A, MD   1 mg at 01/15/22 0848   insulin aspart (novoLOG) injection 0-6 Units  0-6 Units Subcutaneous TID WC Opyd, Ilene Qua, MD   1 Units at 01/14/22 1751   insulin aspart protamine- aspart (NOVOLOG MIX 70/30) injection 16 Units  16 Units Subcutaneous BID WC Mansy, Jan A, MD   16 Units at 01/15/22 1730   lidocaine (LIDODERM) 5 % 1 patch  1 patch Transdermal Q24H Mansy, Jan A, MD   1 patch at 01/14/22 2144   lidocaine-prilocaine (EMLA) cream 1 application  1 application. Topical Q dialysis Mansy, Jan A, MD       metoprolol tartrate (LOPRESSOR) tablet 25 mg  25 mg Oral BID Mansy, Jan A, MD   25 mg at 01/15/22 0847   multivitamin with minerals tablet 1 tablet  1 tablet Oral Once per day on Mon Thu Mansy, Jan A, MD   1 tablet at 01/15/22 0847    ondansetron [facility]) tablet 4 mg  4 mg Oral Q6H PRN Mansy, Jan A, MD       Or   ondansetron [facility]) injection 4 mg  4 mg Intravenous Q6H PRN Mansy, Jan A, MD       oxyCODONE (Oxy IR/ROXICODONE) immediate release tablet 5 mg  5 mg Oral Q6H PRN Alma Friendly, MD   5 mg at 01/15/22 1545   pantoprazole (PROTONIX) EC tablet 40 mg  40 mg Oral Daily Mansy, Jan A, MD   40 mg at 01/15/22 0847   traZODone (DESYREL) tablet 25 mg  25 mg Oral QHS PRN Mansy, Jan A, MD       Labs: Basic Metabolic Panel: Recent Labs  Lab 01/13/22 1505 01/14/22 0415 01/15/22 0342  NA 135 135 133*  K 3.7 3.9 4.1  CL 96* 96* 97*  CO2 '28 26 25  ' GLUCOSE 157* 153* 145*  BUN 14 17 24*  CREATININE 2.03* 2.58* 3.30*  CALCIUM 8.4* 8.4* 8.4*   Liver Function Tests: Recent Labs  Lab 01/13/22 1505  AST 25  ALT 19  ALKPHOS 91  BILITOT 0.9  PROT 5.5*  ALBUMIN 2.4*   No results for input(s): LIPASE, AMYLASE in the last 168 hours. No results for input(s): AMMONIA in the last 168 hours. CBC: Recent Labs  Lab 01/13/22 1505 01/14/22 0415 01/15/22 0342  WBC 9.2 12.8* 9.9  NEUTROABS 6.7  --  7.0  HGB 10.9* 11.0* 9.5*  HCT 36.0* 35.2* 30.7*  MCV 100.6* 100.0 99.4  PLT PLATELET CLUMPS NOTED ON SMEAR, UNABLE TO ESTIMATE 104* 99*   Cardiac Enzymes: No results for input(s): CKTOTAL, CKMB, CKMBINDEX, TROPONINI in the last 168 hours. CBG: Recent  Labs  Lab 01/14/22 1709 01/14/22 2140 01/15/22 0732 01/15/22 1138 01/15/22 1703  GLUCAP 166* 150* 144* 137* 135*   Iron Studies: No results for input(s): IRON, TIBC, TRANSFERRIN, FERRITIN in the last 72 hours. Studies/Results: CT Lumbar Spine Wo Contrast  Result Date: 01/13/2022 CLINICAL DATA:  Low back pain EXAM: CT LUMBAR SPINE WITHOUT CONTRAST TECHNIQUE: Multidetector CT imaging of the lumbar spine was performed without intravenous contrast administration. Multiplanar CT image reconstructions were also generated. RADIATION DOSE REDUCTION: This exam was performed  according to the departmental dose-optimization program which includes automated exposure control, adjustment of the mA and/or kV according to patient size and/or use of iterative reconstruction technique. COMPARISON:  10/28/2021 FINDINGS: Segmentation: 5 lumbar type vertebrae. Alignment: Normal. Vertebrae: There are bilateral laminectomies at L3 and L4. There is progressive increase in size of inferior L2 and superior L3 endplate deformities. Gas in the disc space argues against infection. No acute fracture. Paraspinal and other soft tissues: Calcific aortic atherosclerosis. Disc levels: T12-L1: Unremarkable. L1-2: No spinal canal stenosis. L2-3: Progression of endplate changes. Large disc bulge with osteophytes. Moderate spinal canal and bilateral neural foraminal stenosis. L3-4: Posterior decompression without spinal canal stenosis or neural impingement. L4-5: Small left subarticular disc protrusion without stenosis. L5-S1: Disc bulge and endplate spurring with moderate bilateral foraminal stenosis. No spinal canal stenosis. IMPRESSION: 1. Progressive increase in size of inferior L2 and superior L3 endplate deformities, likely degenerative. Gas in the disc space argues against infection. 2. Moderate spinal canal and bilateral neural foraminal stenosis at L2-3. 3. Moderate bilateral L5-S1 foraminal stenosis. Aortic Atherosclerosis (ICD10-I70.0). Electronically Signed   By: Ulyses Jarred M.D.   On: 01/13/2022 23:15   MR LUMBAR SPINE WO CONTRAST  Result Date: 01/15/2022 CLINICAL DATA:  Fall today with left lower extremity weakness. Known lumbar spine compression deformities. EXAM: MRI LUMBAR SPINE WITHOUT CONTRAST TECHNIQUE: Multiplanar, multisequence MR imaging of the lumbar spine was performed. No intravenous contrast was administered. COMPARISON:  CT lumbar spine 01/13/2022, 10/28/2021 and 09/16/2021. FINDINGS: Despite efforts by the technologist and patient, mild motion artifact is present on today's exam and  could not be eliminated. This reduces exam sensitivity and specificity. Segmentation: Conventional anatomy assumed, with the last open disc space designated L5-S1.Concordant with previous imaging. Alignment:  Physiologic. Vertebrae: As demonstrated on recent CT, there are progressive endplate deformities involving the inferior endplate of L2 and superior endplate of L3 compared with previous CT of 09/16/2021. These deformities are associated with adjacent endplate sclerosis and no significant surrounding marrow edema. There is nonspecific T2 hyperintensity within the L2-3 disc. Endplate degenerative changes are also present at L4-5. Previous bilateral laminectomies at L3 and L4. The visualized sacroiliac joints appear unremarkable. Conus medullaris: Extends to the L2 level and appears normal. Paraspinal and other soft tissues: Mild generalized soft tissue edema and muscular atrophy. No focal paraspinal fluid collection identified. Disc levels: Sagittal images demonstrate mild disc degeneration and bulging at T11-12 without resulting spinal stenosis or nerve root encroachment. T12-L1: Normal interspace. L1-2: Mild disc bulging and bilateral facet hypertrophy. No significant spinal stenosis or nerve root encroachment. L2-3: As above, progressive endplate deformities with annular disc bulging and a right paracentral disc protrusion. Moderate facet and ligamentous hypertrophy. Resulting severe spinal stenosis with lateral recess and foraminal narrowing bilaterally. L3-4: Previous posterior decompression. Mild loss of disc height with annular disc bulging and mild facet hypertrophy. No significant residual spinal stenosis or nerve root encroachment. L4-5: Previous posterior decompression. Loss of disc height with annular disc bulging and  a broad-based disc protrusion in the left subarticular zone. Mild bilateral facet hypertrophy. Mild asymmetric left lateral recess and left foraminal narrowing. L5-S1: Chronic  degenerative disc disease with loss of disc height, annular disc bulging and endplate osteophytes. Chronic moderate foraminal narrowing bilaterally appears unchanged. IMPRESSION: 1. As seen on recent CT, there are progressive endplate deformities adjacent to the L2-3 disc with associated nonspecific discal hyperintensity. Based on previous studies, findings may reflect progressive subacute fractures with progressive endplate degeneration. Partially treated discitis/osteomyelitis is a consideration, although there is no significant bone marrow edema or paraspinal fluid collection. 2. Associated disc degeneration with annular disc bulging, facet and ligamentous hypertrophy contributing to severe multifactorial spinal stenosis and moderate narrowing of the lateral recesses and foramina bilaterally. 3. Previous posterior decompression at L3-4 and L4-5 without significant recurrent spinal stenosis. 4. Chronic moderate foraminal narrowing bilaterally at L5-S1 due to disc bulging and endplate osteophytes. Electronically Signed   By: Richardean Sale M.D.   On: 01/15/2022 14:17    Physical Exam: Vitals:   01/14/22 2143 01/15/22 0527 01/15/22 0937 01/15/22 1703  BP: 112/67 108/68 116/70 121/73  Pulse: (!) 110 (!) 106 (!) 110 (!) 103  Resp: '17 18 17 18  ' Temp: 98.5 F (36.9 C) 98.2 F (36.8 C) 98.2 F (36.8 C) 98.3 F (36.8 C)  TempSrc: Oral Oral    SpO2: 99% 95% 98% 98%  Weight:      Height:         General: WDWN NAD Head: NCAT sclera not icteric Lungs: CTA bilaterally. No wheeze, rales or rhonchi. Breathing is unlabored. Heart: RRR. No murmur, rubs or gallops.  Abdomen: soft, nontender, +BS Lower extremities: R BKA; No edema LLE Neuro: AAOx3. Moves all extremities spontaneously. Dialysis Access: R AVF (+) B/T  Dialysis Orders:  TTS - GOC 4hrs5mn, BFR 400, DFR 800,  EDW 94kg, 3K/ 2.5Ca No Systemic Heparin Mircera 200 mcg q2wks - last 01/11/22 Calcitriol 0.270m PO qHD - last  01/13/22  Assessment/Plan: 1 Lumbar back pain/Radiculopathy: Followed by Spine Surgery Dr. CrSaintclair HalstedMR Lumbar Spine completed today. Continue pain management and Pt/OT 2 ESRD - on HD TTS. Plan for HD tomorrow. 3  Hypertension/volume  - Currently not volume overloaded. Bps stable 4. Anemia of CKD - Hgb 9.5. ESA not due yet 5. Secondary Hyperparathyroidism - Will check PO4 in AM. Will resume VDRA. 6. Nutrition - Renal diet on fluid restriction  CoTobie PoetNP [facility] 01/15/2022, 5:36 PM

## 2022-01-15 NOTE — Progress Notes (Addendum)
PROGRESS NOTE  Alexander [name] JJK:[id number] DOB: 1961-05-02 DOA: 01/13/2022 PCP: [name], MD  HPI/Recap of past 24 hours: Alexander [name] is a 61 y.o. male with medical history significant for ESRD on HD on TTS, HTN, HLD, PVD status post right BKA and left midfoot amputation, Afib on Eliquis, CAD, DM2, and status post pacemaker, presented to the ER with worsening intractable low back pain over the last couple of days with associated left lower extremity radiation and radiculopathy with associated spasm and weakness, denies any urinary or stool incontinence. He has been having chronic low back pain with occasional radiation to both lower extremities. Pt has been followed by spine surgery and was scheduled for LS spine MRI on 02/07/2022.  He was noted to have significant edema with weeping in both lower extremities with recent Ortho follow-up status post his amputation (Dr [name]).  This was managed with compression wraps and close follow up was scheduled on 01/15/22.  Reported falls x2 on 01/13/2022, PTA and noted LLE weakness since that time.  In the ED, BP 104/65 otherwise vital signs. Labs at baseline.  Admitted for further management.     Today, patient continues to report significant back pain/spasm, no new complaints.    Assessment/Plan: Principal Problem:   Lumbar back pain with radiculopathy affecting left lower extremity Active Problems:   ESRD (end stage renal disease) (HCC)   PAF (paroxysmal atrial fibrillation) (HCC)   Essential hypertension, benign   Dyslipidemia   Type 2 diabetes mellitus with chronic kidney disease, with long-term current use of insulin (HCC)   GERD without esophagitis   Pressure injury of skin   Intermittent lumbar back pain/spasm with radiculopathy affecting LLE Acute on chronic back pain Reports intermittent spasms/pain CT noted moderate neuroforaminal stenosis at L2-L3 and L5-S1 MRI lumbar spine showed subacute fractures L2-L3 disc, partially  treated discitis/osteomyelitis is a consideration, severe multifactorial spinal stenosis Followed by spine surgery Dr Saintclair Halsted, discussed with him on 01/15/2022, will see patient on 01/16/22, rec IV decadron, crp/esr Pain management PT/OT- recommending SNF  History of PVD s/p R BKA and left midfoot amputation At outpatient visit on 01/10/2022 with orthopedics, noted BLE with significant weeping, edema with induration, both noted with distal maceration with L LE noted open ulceration Concern for ischemic changes, placed in Unna and Dynaflex compression wraps bilaterally Consulted [name], will see on 01/16/2022  Leukocytosis Resolved Currently afebrile BC x2 NGTD Low threshold to start antibiotics, but will hold off until BLE is reevaluated on 01/16/2022, as of 01/10/2022, no suspected infection while examining at outpatient Trend WBC  Thrombocytopenia Chronic Daily CBC   ESRD ([facility]) HD- T/T/S Nephrology consulted  Anemia of ESRD Baseline hemoglobin between 8-9, currently at baseline Daily CBC   PAF (paroxysmal atrial fibrillation) ([place]) Continue Lopressor and Eliquis  Type 2 diabetes mellitus with chronic kidney disease, with long-term current use of insulin (HCC) Last A1c 6.8 SSI, NovoLog 70/30 twice daily, hypoglycemic protocol, Accu-Cheks   Essential hypertension, benign Continue Lopressor   HLD Continue statin therapy  GERD without esophagitis Continue PPI therapy.     Estimated body mass index is 27.93 kg/m as calculated from the following:   Height as of this encounter: 6' (1.829 m).   Weight as of this encounter: 93.4 kg.     Code Status: Full  Family Communication: None at bedside  Disposition Plan: Status is: Inpatient Remains inpatient appropriate because: Level of care      Consultants: Neurosurgery-Dr. Saintclair Halsted Orthopedics-[name] Nephrology  Procedures: None  Antimicrobials: None  DVT prophylaxis: Eliquis   Objective: Vitals:   01/14/22 1606  01/14/22 2143 01/15/22 0527 01/15/22 0937  BP: 110/68 112/67 108/68 116/70  Pulse: 68 (!) 110 (!) 106 (!) 110  Resp: '18 17 18 17  ' Temp: 98.6 F (37 C) 98.5 F (36.9 C) 98.2 F (36.8 C) 98.2 F (36.8 C)  TempSrc: Oral Oral Oral   SpO2: 99% 99% 95% 98%  Weight:      Height:        Intake/Output Summary (Last 24 hours) at 01/15/2022 1625 Last data filed at 01/15/2022 1300 Gross per 24 hour  Intake 820 ml  Output 0 ml  Net 820 ml   Filed Weights   01/13/22 2134 01/14/22 0317  Weight: 107.5 kg 93.4 kg    Exam: General: NAD, poor dentition Cardiovascular: S1, S2 present Respiratory: CTAB Abdomen: Soft, nontender, nondistended, bowel sounds present Musculoskeletal: Noted right BKA, left midfoot amputation boot dressed in Unna wraps Skin: As mentioned above Psychiatry: Normal mood  Neurology: Limited due to pain, no obvious extremity weakness noted   Data Reviewed: CBC: Recent Labs  Lab 01/13/22 1505 01/14/22 0415 01/15/22 0342  WBC 9.2 12.8* 9.9  NEUTROABS 6.7  --  7.0  HGB 10.9* 11.0* 9.5*  HCT 36.0* 35.2* 30.7*  MCV 100.6* 100.0 99.4  PLT PLATELET CLUMPS NOTED ON SMEAR, UNABLE TO ESTIMATE 104* 99*   Basic Metabolic Panel: Recent Labs  Lab 01/13/22 1505 01/14/22 0415 01/15/22 0342  NA 135 135 133*  K 3.7 3.9 4.1  CL 96* 96* 97*  CO2 '28 26 25  ' GLUCOSE 157* 153* 145*  BUN 14 17 24*  CREATININE 2.03* 2.58* 3.30*  CALCIUM 8.4* 8.4* 8.4*  MG 1.7  --   --    GFR: Estimated Creatinine Clearance: 27.2 mL/min (A) (by C-G formula based on SCr of 3.3 mg/dL (H)). Liver Function Tests: Recent Labs  Lab 01/13/22 1505  AST 25  ALT 19  ALKPHOS 91  BILITOT 0.9  PROT 5.5*  ALBUMIN 2.4*   No results for input(s): LIPASE, AMYLASE in the last 168 hours. No results for input(s): AMMONIA in the last 168 hours. Coagulation Profile: No results for input(s): INR, PROTIME in the last 168 hours. Cardiac Enzymes: No results for input(s): CKTOTAL, CKMB, CKMBINDEX,  TROPONINI in the last 168 hours. BNP (last 3 results) No results for input(s): PROBNP in the last 8760 hours. HbA1C: No results for input(s): HGBA1C in the last 72 hours. CBG: Recent Labs  Lab 01/14/22 1132 01/14/22 1709 01/14/22 2140 01/15/22 0732 01/15/22 1138  GLUCAP 142* 166* 150* 144* 137*   Lipid Profile: No results for input(s): CHOL, HDL, LDLCALC, TRIG, CHOLHDL, LDLDIRECT in the last 72 hours. Thyroid Function Tests: No results for input(s): TSH, T4TOTAL, FREET4, T3FREE, THYROIDAB in the last 72 hours. Anemia Panel: No results for input(s): VITAMINB12, FOLATE, FERRITIN, TIBC, IRON, RETICCTPCT in the last 72 hours. Urine analysis:    Component Value Date/Time   COLORURINE YELLOW 05/23/2020 2057   APPEARANCEUR CLEAR 05/23/2020 2057   LABSPEC 1.008 05/23/2020 2057   PHURINE 5.0 05/23/2020 2057   GLUCOSEU 50 (A) 05/23/2020 2057   HGBUR LARGE (A) 05/23/2020 2057   BILIRUBINUR NEGATIVE 05/23/2020 2057   BILIRUBINUR neg 10/01/2014 1445   KETONESUR NEGATIVE 05/23/2020 2057   PROTEINUR 100 (A) 05/23/2020 2057   UROBILINOGEN 0.2 10/01/2014 1445   UROBILINOGEN 0.2 09/30/2014 2051   NITRITE NEGATIVE 05/23/2020 2057   LEUKOCYTESUR NEGATIVE 05/23/2020 2057  Sepsis Labs: '@LABRCNTIP' (procalcitonin:4,lacticidven:4)  ) Recent Results (from the past 240 hour(s))  Resp Panel by RT-PCR (Flu A&B, Covid) Nasopharyngeal Swab     Status: None   Collection Time: 01/14/22  1:13 AM   Specimen: Nasopharyngeal Swab; Nasopharyngeal(NP) swabs in vial transport medium  Result Value Ref Range Status   SARS Coronavirus 2 by RT PCR NEGATIVE NEGATIVE Final    Comment: (NOTE) SARS-CoV-2 target nucleic acids are NOT DETECTED.  The SARS-CoV-2 RNA is generally detectable in upper respiratory specimens during the acute phase of infection. The lowest concentration of SARS-CoV-2 viral copies this assay can detect is 138 copies/mL. A negative result does not preclude SARS-Cov-2 infection and  should not be used as the sole basis for treatment or other patient management decisions. A negative result may occur with  improper specimen collection/handling, submission of specimen other than nasopharyngeal swab, presence of viral mutation(s) within the areas targeted by this assay, and inadequate number of viral copies(<138 copies/mL). A negative result must be combined with clinical observations, patient history, and epidemiological information. The expected result is Negative.  Fact Sheet for Patients:  EntrepreneurPulse.com.au  Fact Sheet for Healthcare Providers:  IncredibleEmployment.be  This test is no t yet approved or cleared by the [place] FDA and  has been authorized for detection and/or diagnosis of SARS-CoV-2 by FDA under an Emergency Use Authorization (EUA). This EUA will remain  in effect (meaning this test can be used) for the duration of the COVID-19 declaration under Section 564(b)(1) of the Act, 21 U.S.C.section 360bbb-3(b)(1), unless the authorization is terminated  or revoked sooner.       Influenza A by PCR NEGATIVE NEGATIVE Final   Influenza B by PCR NEGATIVE NEGATIVE Final    Comment: (NOTE) The Xpert Xpress SARS-CoV-2/FLU/RSV plus assay is intended as an aid in the diagnosis of influenza from Nasopharyngeal swab specimens and should not be used as a sole basis for treatment. Nasal washings and aspirates are unacceptable for Xpert Xpress SARS-CoV-2/FLU/RSV testing.  Fact Sheet for Patients: EntrepreneurPulse.com.au  Fact Sheet for Healthcare Providers: IncredibleEmployment.be  This test is not yet approved or cleared by the [place] FDA and has been authorized for detection and/or diagnosis of SARS-CoV-2 by FDA under an Emergency Use Authorization (EUA). This EUA will remain in effect (meaning this test can be used) for the duration of the COVID-19 declaration  under Section 564(b)(1) of the Act, 21 U.S.C. section 360bbb-3(b)(1), unless the authorization is terminated or revoked.  Performed at [facility], [place] [address], [place], [place] [address]   MRSA Next Gen by PCR, Nasal     Status: None   Collection Time: 01/14/22  7:18 AM   Specimen: Nasal Mucosa; Nasal Swab  Result Value Ref Range Status   MRSA by PCR Next Gen NOT DETECTED NOT DETECTED Final    Comment: (NOTE) The GeneXpert MRSA Assay (FDA approved for NASAL specimens only), is one component of a comprehensive MRSA colonization surveillance program. It is not intended to diagnose MRSA infection nor to guide or monitor treatment for MRSA infections. Test performance is not FDA approved in patients less than 72 years old. Performed at [facility], [place] [address] [place], [place] [address]   Culture, blood (routine x 2)     Status: None (Preliminary result)   Collection Time: 01/14/22  3:24 PM   Specimen: BLOOD LEFT WRIST  Result Value Ref Range Status   Specimen Description BLOOD LEFT WRIST  Final   Special  Requests   Final    BOTTLES DRAWN AEROBIC AND ANAEROBIC Blood Culture results may not be optimal due to an inadequate volume of blood received in culture bottles   Culture   Final    NO GROWTH < 24 HOURS Performed at [place] [address], [place], [place] [address]    Report Status PENDING  Incomplete  Culture, blood (routine x 2)     Status: None (Preliminary result)   Collection Time: 01/14/22  3:33 PM   Specimen: BLOOD LEFT FOREARM  Result Value Ref Range Status   Specimen Description BLOOD LEFT FOREARM  Final   Special Requests   Final    BOTTLES DRAWN AEROBIC AND ANAEROBIC Blood Culture adequate volume   Culture   Final    NO GROWTH < 24 HOURS Performed at [facility], [place] [address] [place], [place] [address]    Report Status PENDING  Incomplete      Studies: MR LUMBAR SPINE WO CONTRAST  Result Date: 01/15/2022 CLINICAL  DATA:  Fall today with left lower extremity weakness. Known lumbar spine compression deformities. EXAM: MRI LUMBAR SPINE WITHOUT CONTRAST TECHNIQUE: Multiplanar, multisequence MR imaging of the lumbar spine was performed. No intravenous contrast was administered. COMPARISON:  CT lumbar spine 01/13/2022, 10/28/2021 and 09/16/2021. FINDINGS: Despite efforts by the technologist and patient, mild motion artifact is present on today's exam and could not be eliminated. This reduces exam sensitivity and specificity. Segmentation: Conventional anatomy assumed, with the last open disc space designated L5-S1.Concordant with previous imaging. Alignment:  Physiologic. Vertebrae: As demonstrated on recent CT, there are progressive endplate deformities involving the inferior endplate of L2 and superior endplate of L3 compared with previous CT of 09/16/2021. These deformities are associated with adjacent endplate sclerosis and no significant surrounding marrow edema. There is nonspecific T2 hyperintensity within the L2-3 disc. Endplate degenerative changes are also present at L4-5. Previous bilateral laminectomies at L3 and L4. The visualized sacroiliac joints appear unremarkable. Conus medullaris: Extends to the L2 level and appears normal. Paraspinal and other soft tissues: Mild generalized soft tissue edema and muscular atrophy. No focal paraspinal fluid collection identified. Disc levels: Sagittal images demonstrate mild disc degeneration and bulging at T11-12 without resulting spinal stenosis or nerve root encroachment. T12-L1: Normal interspace. L1-2: Mild disc bulging and bilateral facet hypertrophy. No significant spinal stenosis or nerve root encroachment. L2-3: As above, progressive endplate deformities with annular disc bulging and a right paracentral disc protrusion. Moderate facet and ligamentous hypertrophy. Resulting severe spinal stenosis with lateral recess and foraminal narrowing bilaterally. L3-4: Previous  posterior decompression. Mild loss of disc height with annular disc bulging and mild facet hypertrophy. No significant residual spinal stenosis or nerve root encroachment. L4-5: Previous posterior decompression. Loss of disc height with annular disc bulging and a broad-based disc protrusion in the left subarticular zone. Mild bilateral facet hypertrophy. Mild asymmetric left lateral recess and left foraminal narrowing. L5-S1: Chronic degenerative disc disease with loss of disc height, annular disc bulging and endplate osteophytes. Chronic moderate foraminal narrowing bilaterally appears unchanged. IMPRESSION: 1. As seen on recent CT, there are progressive endplate deformities adjacent to the L2-3 disc with associated nonspecific discal hyperintensity. Based on previous studies, findings may reflect progressive subacute fractures with progressive endplate degeneration. Partially treated discitis/osteomyelitis is a consideration, although there is no significant bone marrow edema or paraspinal fluid collection. 2. Associated disc degeneration with annular disc bulging, facet and ligamentous hypertrophy contributing to severe multifactorial spinal stenosis and moderate  narrowing of the lateral recesses and foramina bilaterally. 3. Previous posterior decompression at L3-4 and L4-5 without significant recurrent spinal stenosis. 4. Chronic moderate foraminal narrowing bilaterally at L5-S1 due to disc bulging and endplate osteophytes. Electronically Signed   By: Richardean Sale M.D.   On: 01/15/2022 14:17    Scheduled Meds:  apixaban  5 mg Oral BID   atorvastatin  20 mg Oral QHS   Chlorhexidine Gluconate Cloth  6 each Topical Daily   furosemide  80 mg Oral Once per day on Sun Mon Wed Fri   insulin aspart  0-6 Units Subcutaneous TID WC   insulin aspart protamine- aspart  16 Units Subcutaneous BID WC   lidocaine  1 patch Transdermal Q24H   metoprolol tartrate  25 mg Oral BID   multivitamin with minerals  1 tablet  Oral Once per day on Mon Thu   pantoprazole  40 mg Oral Daily    Continuous Infusions:   LOS: 2 days     Alma Friendly, MD [facility]  If 7PM-7AM, please contact night-coverage [email or web address] 01/15/2022, 4:25 PM

## 2022-01-15 NOTE — Progress Notes (Signed)
Informed of MRI for today.  ? ?Device system confirmed to be MRI conditional, with implant date > 6 weeks ago, and no evidence of abandoned or epicardial leads in review of most recent CXR ?Interrogation from today reviewed, pt is currently  AF-VS  at ~90-95 bpm ?Change device settings for MRI to VOO at  100  bpm if tolerates ? ?Tachy-therapies to off if applicable. ? ?Program device back to pre-MRI settings after completion of exam. ? ?Shirley Friar, PA-C  ?01/15/2022 1:27 PM   ?

## 2022-01-16 ENCOUNTER — Encounter (HOSPITAL_COMMUNITY): Payer: Self-pay | Admitting: Family Medicine

## 2022-01-16 DIAGNOSIS — L97521 Non-pressure chronic ulcer of other part of left foot limited to breakdown of skin: Secondary | ICD-10-CM | POA: Diagnosis not present

## 2022-01-16 DIAGNOSIS — I10 Essential (primary) hypertension: Secondary | ICD-10-CM | POA: Diagnosis not present

## 2022-01-16 DIAGNOSIS — L97911 Non-pressure chronic ulcer of unspecified part of right lower leg limited to breakdown of skin: Secondary | ICD-10-CM | POA: Diagnosis not present

## 2022-01-16 DIAGNOSIS — Z89511 Acquired absence of right leg below knee: Secondary | ICD-10-CM | POA: Diagnosis not present

## 2022-01-16 DIAGNOSIS — Z89432 Acquired absence of left foot: Secondary | ICD-10-CM

## 2022-01-16 DIAGNOSIS — S88111A Complete traumatic amputation at level between knee and ankle, right lower leg, initial encounter: Secondary | ICD-10-CM

## 2022-01-16 DIAGNOSIS — M5416 Radiculopathy, lumbar region: Secondary | ICD-10-CM | POA: Diagnosis not present

## 2022-01-16 DIAGNOSIS — N186 End stage renal disease: Secondary | ICD-10-CM | POA: Diagnosis not present

## 2022-01-16 DIAGNOSIS — E785 Hyperlipidemia, unspecified: Secondary | ICD-10-CM | POA: Diagnosis not present

## 2022-01-16 LAB — BASIC METABOLIC PANEL
Anion gap: 11 (ref 5–15)
BUN: 34 mg/dL — ABNORMAL HIGH (ref 8–23)
CO2: 24 mmol/L (ref 22–32)
Calcium: 8.5 mg/dL — ABNORMAL LOW (ref 8.9–10.3)
Chloride: 95 mmol/L — ABNORMAL LOW (ref 98–111)
Creatinine, Ser: 3.99 mg/dL — ABNORMAL HIGH (ref 0.61–1.24)
GFR, Estimated: 16 mL/min — ABNORMAL LOW (ref >60)
Glucose, Bld: 181 mg/dL — ABNORMAL HIGH (ref 70–99)
Potassium: 4.3 mmol/L (ref 3.5–5.1)
Sodium: 130 mmol/L — ABNORMAL LOW (ref 135–145)

## 2022-01-16 LAB — GLUCOSE, CAPILLARY
Glucose-Capillary: 278 mg/dL — ABNORMAL HIGH (ref 70–99)
Glucose-Capillary: 258 mg/dL — ABNORMAL HIGH (ref 70–99)
Glucose-Capillary: 211 mg/dL — ABNORMAL HIGH (ref 70–99)
Glucose-Capillary: 180 mg/dL — ABNORMAL HIGH (ref 70–99)

## 2022-01-16 LAB — CBC WITH DIFFERENTIAL/PLATELET
Abs Immature Granulocytes: 0.04 10*3/uL (ref 0.00–0.07)
Basophils Absolute: 0 10*3/uL (ref 0.0–0.1)
Basophils Relative: 0 %
Eosinophils Absolute: 0 10*3/uL (ref 0.0–0.5)
Eosinophils Relative: 0 %
HCT: 30.8 % — ABNORMAL LOW (ref 39.0–52.0)
Hemoglobin: 9.8 g/dL — ABNORMAL LOW (ref 13.0–17.0)
Immature Granulocytes: 1 %
Lymphocytes Relative: 17 %
Lymphs Abs: 1.2 10*3/uL (ref 0.7–4.0)
MCH: 31.1 pg (ref 26.0–34.0)
MCHC: 31.8 g/dL (ref 30.0–36.0)
MCV: 97.8 fL (ref 80.0–100.0)
Monocytes Absolute: 0.1 10*3/uL (ref 0.1–1.0)
Monocytes Relative: 1 %
Neutro Abs: 6.1 10*3/uL (ref 1.7–7.7)
Neutrophils Relative %: 81 %
Platelets: 92 10*3/uL — ABNORMAL LOW (ref 150–400)
RBC: 3.15 MIL/uL — ABNORMAL LOW (ref 4.22–5.81)
RDW: 18.8 % — ABNORMAL HIGH (ref 11.5–15.5)
WBC: 7.5 10*3/uL (ref 4.0–10.5)
nRBC: 0.7 % — ABNORMAL HIGH (ref 0.0–0.2)

## 2022-01-16 LAB — HEPATITIS B SURFACE ANTIBODY,QUALITATIVE: Hep B S Ab: REACTIVE — AB

## 2022-01-16 LAB — SEDIMENTATION RATE: Sed Rate: 30 mm/hr — ABNORMAL HIGH (ref 0–16)

## 2022-01-16 LAB — HEPATITIS B SURFACE ANTIGEN: Hepatitis B Surface Ag: NONREACTIVE

## 2022-01-16 LAB — PHOSPHORUS: Phosphorus: 4.9 mg/dL — ABNORMAL HIGH (ref 2.5–4.6)

## 2022-01-16 LAB — C-REACTIVE PROTEIN: CRP: 14.9 mg/dL — ABNORMAL HIGH (ref <1.0)

## 2022-01-16 NOTE — Consult Note (Signed)
Reason for Consult lower extremity weakness Referring Physician: Hospitalist  Alexander [name] is an 61 y.o. male.  HPI: 61 year old gentleman with longstanding multiple medical problems to include end-stage renal disease coronary disease and atrial fibrillation on Eliquis who I saw a month ago with back pain we ordered an outpatient MRI scan however patient showed up in the ER Saturday evening with weakness in his left leg after a fall.  Patient says he is not been unable to move his left leg since reports no change in bladder function he still is able to make a little bit of urine intermittently around his dialysis.  Right leg is at its baseline.  Past Medical History:  Diagnosis Date   Acute blood loss anemia    Alcohol abuse    Anemia    Anemia of chronic disease    Arthritis    "patient does not think so."   Atrial fibrillation ([place])    Cardiac syncope 10/07/2014   rapid A fib with 8 sec pauses on converison with syncope- temp pacing wire placed then PPM   Cataract    BILATERAL   Coronary artery disease    Diabetes mellitus    dx---been  awhile   Elevated LFTs    Endocarditis of tricuspid valve    ESRD (end stage renal disease) ([place])    [place]   GERD (gastroesophageal reflux disease)    History of blood transfusion    Hyperlipemia    Hypertension    Malnutrition ([place])    Osteomyelitis ([place])    right foot   Peripheral vascular disease ([place])    Presence of permanent cardiac pacemaker 10/08/2014   [employer]   Tricuspid valve vegetation     Past Surgical History:  Procedure Laterality Date   A/V FISTULAGRAM Right 05/01/2021   Procedure: A/V FISTULAGRAM;  Surgeon: Waynetta Sandy, MD;  Location: [facility];  Service: Cardiovascular;  Laterality: Right;  failed fistulagram   ABDOMINAL AORTOGRAM W/LOWER EXTREMITY N/A 09/09/2017   Procedure: ABDOMINAL AORTOGRAM W/LOWER EXTREMITY;  Surgeon: Angelia Mould, MD;  Location: [place] [facility];  Service: Cardiovascular;  Laterality: N/A;   ABDOMINAL AORTOGRAM W/LOWER EXTREMITY Right 11/11/2019   Procedure: ABDOMINAL AORTOGRAM W/LOWER EXTREMITY;  Surgeon: Marty Heck, MD;  Location: [facility];  Service: Cardiovascular;  Laterality: Right;   AMPUTATION Left 08/19/2013   Procedure: AMPUTATION RAY;  Surgeon: Alta Corning, MD;  Location: [place];  Service: Orthopedics;  Laterality: Left;  ray amputation left 5th   AMPUTATION Left 10/27/2013   Procedure: AMPUTATION DIGIT-LEFT 4TH TOE, 4th and 5th metatarsal.;  Surgeon: Angelia Mould, MD;  Location: [place];  Service: Vascular;  Laterality: Left;   AMPUTATION Left 11/04/2013   Procedure: LEFT FOOT TRANS-METATARSAL AMPUTATION WITH WOUND CLOSURE ;  Surgeon: Alta Corning, MD;  Location: [place];  Service: Orthopedics;  Laterality: Left;   AMPUTATION Right 09/10/2017   Procedure: RIGHT FOURTH AND FIFTH TOE AMPUTATION;  Surgeon: Angelia Mould, MD;  Location: [place];  Service: Vascular;  Laterality: Right;   AMPUTATION Right 02/19/2020   Procedure: AMPUTATION RIGHT 3RD TOE;  Surgeon: Angelia Mould, MD;  Location: [facility];  Service: Vascular;  Laterality: Right;   AMPUTATION Right 03/11/2020   Procedure: RIGHT BELOW KNEE AMPUTATION;  Surgeon: Newt Minion, MD;  Location: [facility];  Service: Orthopedics;  Laterality: Right;   APPLICATION OF WOUND VAC Right 02/19/2020   Procedure: Application Of Wound  Vac Right foot;  Surgeon: Angelia Mould, MD;  Location: [place];  Service: Vascular;  Laterality: Right;   AV FISTULA PLACEMENT Right 03/27/2021   Procedure: RIGHT ARM ARTERIOVENOUS (AV) FISTULA;  Surgeon: Angelia Mould, MD;  Location: [place];  Service: Vascular;  Laterality: Right;   AV FISTULA PLACEMENT Right 05/10/2021   Procedure: RIGHT ARM ARTERIOVENOUS (AV) FISTULA;  Surgeon: Waynetta Sandy, MD;  Location: [facility];  Service: Vascular;  Laterality: Right;   [place] Right 08/11/2021   Procedure: RIGHT ARM SECOND STAGE BASILIC VEIN TRANSPOSITION;  Surgeon: Waynetta Sandy, MD;  Location: [place];  Service: Vascular;  Laterality: Right;   BIOPSY  07/20/2020   Procedure: BIOPSY;  Surgeon: Irving Copas., MD;  Location: [place];  Service: Gastroenterology;;   CARDIAC CATHETERIZATION  10/07/2014   Procedure: LEFT HEART CATH AND CORONARY ANGIOGRAPHY;  Surgeon: Laverda Page, MD;  Location: [facility] CATH LAB;  Service: Cardiovascular;;   COLONOSCOPY W/ POLYPECTOMY     COLONOSCOPY WITH PROPOFOL N/A 05/25/2020   Procedure: COLONOSCOPY WITH PROPOFOL;  Surgeon: Gatha Mayer, MD;  Location: [facility];  Service: Endoscopy;  Laterality: N/A;   EMBOLECTOMY Right 09/12/2017   Procedure: Thrombectomy  & Redo Right Below Knee Popliteal Artery Bypass Graft  ;  Surgeon: Waynetta Sandy, MD;  Location: [facility];  Service: Vascular;  Laterality: Right;   ENTEROSCOPY N/A 07/20/2020   Procedure: ENTEROSCOPY;  Surgeon: Rush Landmark Telford Nab., MD;  Location: [place];  Service: Gastroenterology;  Laterality: N/A;   ESOPHAGOGASTRODUODENOSCOPY (EGD) WITH PROPOFOL N/A 05/24/2020   Procedure: ESOPHAGOGASTRODUODENOSCOPY (EGD) WITH PROPOFOL;  Surgeon: Ladene Artist, MD;  [facility];  Service: Endoscopy;  Laterality: N/A;   EYE SURGERY Bilateral    cataract   FEMORAL-TIBIAL BYPASS GRAFT Left 10/27/2013   Procedure: BYPASS GRAFT LEFT FEMORAL- POSTERIOR TIBIAL ARTERY;  Surgeon: Angelia Mould, MD;  Location: [place];  Service: Vascular;  Laterality: Left;   FEMORAL-TIBIAL BYPASS GRAFT Right 09/10/2017   Procedure: RIGHT SUPERFICIAL  FEMORAL ARTERY-BELOW KNEE POPLITEAL ARTERY BYPASS GRAFT WITH VEIN;  Surgeon: Angelia Mould, MD;  Location: [place];  Service: Vascular;  Laterality: Right;   GIVENS CAPSULE STUDY N/A 07/20/2020   Procedure: GIVENS CAPSULE STUDY;  Surgeon: Irving Copas., MD;   Location: [place];  Service: Gastroenterology;  Laterality: N/A;  capsule placed through scope into duodenum @ 0936   I & D EXTREMITY Left 10/27/2013   Procedure: IRRIGATION AND DEBRIDEMENT EXTREMITY- LEFT FOOT;  Surgeon: Angelia Mould, MD;  Location: [place];  Service: Vascular;  Laterality: Left;   I & D EXTREMITY Right 01/26/2020   Procedure: IRRIGATION AND DEBRIDEMENT EXTREMITY RIGHT FOOT WOUND;  Surgeon: Angelia Mould, MD;  Location: [place];  Service: Vascular;  Laterality: Right;   INSERT / REPLACE / REMOVE PACEMAKER     INSERTION OF DIALYSIS CATHETER Right 11/03/2021   Procedure: RIGHT INTERNAL JUGULAR INSERTION OF TUNNELED DIALYSIS CATHETER;  Surgeon: Angelia Mould, MD;  Location: [place];  Service: Vascular;  Laterality: Right;   INTRAOPERATIVE ARTERIOGRAM Right 09/10/2017   Procedure: INTRA OPERATIVE ARTERIOGRAM;  Surgeon: Angelia Mould, MD;  Location: [place];  Service: Vascular;  Laterality: Right;   IR ANGIOGRAM FOLLOW UP STUDY  09/09/2017   IR FLUORO GUIDE CV LINE RIGHT  05/31/2020   IR US GUIDE VASC ACCESS RIGHT  06/01/2020   LEFT HEART CATH AND CORONARY ANGIOGRAPHY N/A 08/19/2020  Procedure: LEFT HEART CATH AND CORONARY ANGIOGRAPHY;  Surgeon: [place] Prows, MD;  Location: [facility];  Service: Cardiovascular;  Laterality: N/A;   LEFT HEART CATHETERIZATION WITH CORONARY ANGIOGRAM N/A 11/09/2014   Procedure: LEFT HEART CATHETERIZATION WITH CORONARY ANGIOGRAM;  Surgeon: Laverda Page, MD;  Location: [facility] CATH LAB;  Service: Cardiovascular;  Laterality: N/A;   LOWER EXTREMITY ANGIOGRAM Right 11/08/2015   Procedure: Lower Extremity Angiogram;  Surgeon: Serafina [place], MD;  Location: [place] [facility];  Service: Cardiovascular;  Laterality: Right;   LUMBAR LAMINECTOMY  06/13/2012   Procedure: MICRODISCECTOMY LUMBAR LAMINECTOMY;  Surgeon: Jessy Oto, MD;  Location: [place];  Service: Orthopedics;  Laterality: N/A;  Central laminectomy L2-3,  L3-4, L4-5   PACEMAKER INSERTION  10/08/2014   MDT Advisa MRI compatible dual chamber pacemaker implanted by Dr Caryl Comes for syncope with post-termination pauses   PERCUTANEOUS CORONARY STENT INTERVENTION (PCI-S)  11/09/2014   des to lad & distal circumflex         Dr  [name]   PERIPHERAL VASCULAR BALLOON ANGIOPLASTY Right 11/11/2019   Procedure: PERIPHERAL VASCULAR BALLOON ANGIOPLASTY;  Surgeon: Marty Heck, MD;  Location: [place] [facility];  Service: Cardiovascular;  Laterality: Right;  below knee popliteal, tibioperoneal trunk, posterior tibial arteries   PERIPHERAL VASCULAR CATHETERIZATION N/A 11/08/2015   Procedure: Abdominal Aortogram;  Surgeon: Serafina [place], MD;  Location: [place] [facility];  Service: Cardiovascular;  Laterality: N/A;   PERIPHERAL VASCULAR CATHETERIZATION Right 11/08/2015   Procedure: Peripheral Vascular Atherectomy;  Surgeon: Serafina [place], MD;  Location: [place] [facility];  Service: Cardiovascular;  Laterality: Right;   PERIPHERAL VASCULAR CATHETERIZATION N/A 10/10/2016   Procedure: Lower Extremity Angiography;  Surgeon: Waynetta Sandy, MD;  Location: [place] [facility];  Service: Cardiovascular;  Laterality: N/A;   PERIPHERAL VASCULAR CATHETERIZATION Right 10/10/2016   Procedure: Peripheral Vascular Atherectomy;  Surgeon: Waynetta Sandy, MD;  Location: [facility];  Service: Cardiovascular;  Laterality: Right;  Popliteal   PERMANENT PACEMAKER INSERTION N/A 10/08/2014   Procedure: PERMANENT PACEMAKER INSERTION;  Surgeon: Deboraha Sprang, MD;  Location: [facility] CATH LAB;  Service: Cardiovascular;  Laterality: N/A;   SUBMUCOSAL TATTOO INJECTION  07/20/2020   Procedure: SUBMUCOSAL TATTOO INJECTION;  Surgeon: Irving Copas., MD;  Location: [place];  Service: Gastroenterology;;   TEE WITHOUT CARDIOVERSION N/A 10/27/2021   Procedure: TRANSESOPHAGEAL ECHOCARDIOGRAM (TEE);  Surgeon: [place] Prows, MD;  Location: [facility];   Service: Cardiovascular;  Laterality: N/A;   TEMPORARY PACEMAKER INSERTION Bilateral 10/07/2014   Procedure: TEMPORARY PACEMAKER INSERTION;  Surgeon: Laverda Page, MD;  Location: [facility] CATH LAB;  Service: Cardiovascular;  Laterality: Bilateral;   WOUND DEBRIDEMENT Right 02/19/2020   Procedure: DEBRIDEMENT WOUND RIGHT FOOT;  Surgeon: Angelia Mould, MD;  Location: [facility] OR;  Service: Vascular;  Laterality: Right;    Family History  Problem Relation Age of Onset   Diabetes type II Mother    Hypertension Mother    Diabetes Mother    Liver cancer Father    Diabetes type II Sister    Breast cancer Sister    Diabetes Sister    Hypertension Sister    Diabetes type II Brother    Kidney failure Brother    Diabetes Brother    Hypertension Brother    Diabetes type II Sister     Social History:  reports that he quit smoking about 7 years ago. His smoking use included cigarettes. He has a 45.00  pack-year smoking history. He has never used smokeless tobacco. He reports that he does not currently use alcohol after a past usage of about 21.0 standard drinks per week. He reports that he does not use drugs.  Allergies:  Allergies  Allergen Reactions   Cytoxan [Cyclophosphamide] Other (See Comments)    Pancytopenia   Pletal [Cilostazol] Palpitations and Other (See Comments)    "Can hear heart beating loudly".   Keflex [Cephalexin] Nausea And Vomiting   Tylenol [Acetaminophen] Other (See Comments)    Patient has a cold sweat    Medications: I have reviewed the patient's current medications.  Results for orders placed or performed during the hospital encounter of 01/13/22 (from the past 48 hour(s))  Glucose, capillary     Status: Abnormal   Collection Time: 01/14/22  9:15 AM  Result Value Ref Range   Glucose-Capillary 113 (H) 70 - 99 mg/dL    Comment: Glucose reference range applies only to samples taken after fasting for at least 8 hours.  Glucose, capillary     Status: Abnormal    Collection Time: 01/14/22 11:32 AM  Result Value Ref Range   Glucose-Capillary 142 (H) 70 - 99 mg/dL    Comment: Glucose reference range applies only to samples taken after fasting for at least 8 hours.  Culture, blood (routine x 2)     Status: None (Preliminary result)   Collection Time: 01/14/22  3:24 PM   Specimen: BLOOD LEFT WRIST  Result Value Ref Range   Specimen Description BLOOD LEFT WRIST    Special Requests      BOTTLES DRAWN AEROBIC AND ANAEROBIC Blood Culture results may not be optimal due to an inadequate volume of blood received in culture bottles   Culture      NO GROWTH < 24 HOURS Performed at [facility] [address] [place], [place] [address]    Report Status PENDING   Culture, blood (routine x 2)     Status: None (Preliminary result)   Collection Time: 01/14/22  3:33 PM   Specimen: BLOOD LEFT FOREARM  Result Value Ref Range   Specimen Description BLOOD LEFT FOREARM    Special Requests      BOTTLES DRAWN AEROBIC AND ANAEROBIC Blood Culture adequate volume   Culture      NO GROWTH < 24 HOURS Performed at [facility], [place] [address], [place], [place] [address]    Report Status PENDING   Glucose, capillary     Status: Abnormal   Collection Time: 01/14/22  5:09 PM  Result Value Ref Range   Glucose-Capillary 166 (H) 70 - 99 mg/dL    Comment: Glucose reference range applies only to samples taken after fasting for at least 8 hours.  Glucose, capillary     Status: Abnormal   Collection Time: 01/14/22  9:40 PM  Result Value Ref Range   Glucose-Capillary 150 (H) 70 - 99 mg/dL    Comment: Glucose reference range applies only to samples taken after fasting for at least 8 hours.  CBC with Differential/Platelet     Status: Abnormal   Collection Time: 01/15/22  3:42 AM  Result Value Ref Range   WBC 9.9 4.0 - 10.5 K/uL   RBC 3.09 (L) 4.22 - 5.81 MIL/uL   Hemoglobin 9.5 (L) 13.0 - 17.0 g/dL   HCT 30.7 (L) 39.0 - 52.0 %   MCV 99.4 80.0 - 100.0 fL   MCH  30.7 26.0 - 34.0 pg   MCHC 30.9  30.0 - 36.0 g/dL   RDW 18.9 (H) 11.5 - 15.5 %   Platelets 99 (L) 150 - 400 K/uL    Comment: Immature Platelet Fraction may be clinically indicated, consider ordering this additional test [id number] CONSISTENT WITH PREVIOUS RESULT REPEATED TO VERIFY    nRBC 0.7 (H) 0.0 - 0.2 %   Neutrophils Relative % 71 %   Neutro Abs 7.0 1.7 - 7.7 K/uL   Lymphocytes Relative 21 %   Lymphs Abs 2.1 0.7 - 4.0 K/uL   Monocytes Relative 7 %   Monocytes Absolute 0.7 0.1 - 1.0 K/uL   Eosinophils Relative 0 %   Eosinophils Absolute 0.0 0.0 - 0.5 K/uL   Basophils Relative 0 %   Basophils Absolute 0.0 0.0 - 0.1 K/uL   Immature Granulocytes 1 %   Abs Immature Granulocytes 0.06 0.00 - 0.07 K/uL    Comment: Performed at [facility], [address], [place], [place] [address]  Basic metabolic panel     Status: Abnormal   Collection Time: 01/15/22  3:42 AM  Result Value Ref Range   Sodium 133 (L) 135 - 145 mmol/L   Potassium 4.1 3.5 - 5.1 mmol/L   Chloride 97 (L) 98 - 111 mmol/L   CO2 25 22 - 32 mmol/L   Glucose, Bld 145 (H) 70 - 99 mg/dL    Comment: Glucose reference range applies only to samples taken after fasting for at least 8 hours.   BUN 24 (H) 8 - 23 mg/dL   Creatinine, Ser 3.30 (H) 0.61 - 1.24 mg/dL   Calcium 8.4 (L) 8.9 - 10.3 mg/dL   GFR, Estimated 20 (L) >60 mL/min    Comment: (NOTE) Calculated using the CKD-EPI Creatinine Equation (2021)    Anion gap 11 5 - 15    Comment: Performed at [facility] [address], [place], [place] [address]  Glucose, capillary     Status: Abnormal   Collection Time: 01/15/22  7:32 AM  Result Value Ref Range   Glucose-Capillary 144 (H) 70 - 99 mg/dL    Comment: Glucose reference range applies only to samples taken after fasting for at least 8 hours.  Glucose, capillary     Status: Abnormal   Collection Time: 01/15/22 11:38 AM  Result Value Ref Range   Glucose-Capillary 137 (H) 70 - 99 mg/dL    Comment:  Glucose reference range applies only to samples taken after fasting for at least 8 hours.  Glucose, capillary     Status: Abnormal   Collection Time: 01/15/22  5:03 PM  Result Value Ref Range   Glucose-Capillary 135 (H) 70 - 99 mg/dL    Comment: Glucose reference range applies only to samples taken after fasting for at least 8 hours.  C-reactive protein     Status: Abnormal   Collection Time: 01/15/22  6:30 PM  Result Value Ref Range   CRP 14.0 (H) <1.0 mg/dL    Comment: Performed at [facility] [address], [place], [place] [address]  Sedimentation rate     Status: Abnormal   Collection Time: 01/15/22  6:30 PM  Result Value Ref Range   Sed Rate 21 (H) 0 - 16 mm/hr    Comment: Performed at [facility] [address] [place], [place] [address]  Glucose, capillary     Status: Abnormal   Collection Time: 01/15/22  8:32 PM  Result Value Ref Range   Glucose-Capillary 119 (H) 70 - 99  mg/dL    Comment: Glucose reference range applies only to samples taken after fasting for at least 8 hours.  CBC with Differential/Platelet     Status: Abnormal   Collection Time: 01/16/22  3:33 AM  Result Value Ref Range   WBC 7.5 4.0 - 10.5 K/uL   RBC 3.15 (L) 4.22 - 5.81 MIL/uL   Hemoglobin 9.8 (L) 13.0 - 17.0 g/dL   HCT 30.8 (L) 39.0 - 52.0 %   MCV 97.8 80.0 - 100.0 fL   MCH 31.1 26.0 - 34.0 pg   MCHC 31.8 30.0 - 36.0 g/dL   RDW 18.8 (H) 11.5 - 15.5 %   Platelets 92 (L) 150 - 400 K/uL    Comment: Immature Platelet Fraction may be clinically indicated, consider ordering this additional test [id number] CONSISTENT WITH PREVIOUS RESULT REPEATED TO VERIFY    nRBC 0.7 (H) 0.0 - 0.2 %   Neutrophils Relative % 81 %   Neutro Abs 6.1 1.7 - 7.7 K/uL   Lymphocytes Relative 17 %   Lymphs Abs 1.2 0.7 - 4.0 K/uL   Monocytes Relative 1 %   Monocytes Absolute 0.1 0.1 - 1.0 K/uL   Eosinophils Relative 0 %   Eosinophils Absolute 0.0 0.0 - 0.5 K/uL   Basophils Relative 0 %   Basophils  Absolute 0.0 0.0 - 0.1 K/uL   Immature Granulocytes 1 %   Abs Immature Granulocytes 0.04 0.00 - 0.07 K/uL    Comment: Performed at [facility], [address] [place], [place] [address]  Basic metabolic panel     Status: Abnormal   Collection Time: 01/16/22  3:33 AM  Result Value Ref Range   Sodium 130 (L) 135 - 145 mmol/L   Potassium 4.3 3.5 - 5.1 mmol/L   Chloride 95 (L) 98 - 111 mmol/L   CO2 24 22 - 32 mmol/L   Glucose, Bld 181 (H) 70 - 99 mg/dL    Comment: Glucose reference range applies only to samples taken after fasting for at least 8 hours.   BUN 34 (H) 8 - 23 mg/dL   Creatinine, Ser 3.99 (H) 0.61 - 1.24 mg/dL   Calcium 8.5 (L) 8.9 - 10.3 mg/dL   GFR, Estimated 16 (L) >60 mL/min    Comment: (NOTE) Calculated using the CKD-EPI Creatinine Equation (2021)    Anion gap 11 5 - 15    Comment: Performed at [facility] [address] [place], [place] [address]  Sedimentation rate     Status: Abnormal   Collection Time: 01/16/22  3:33 AM  Result Value Ref Range   Sed Rate 30 (H) 0 - 16 mm/hr    Comment: Performed at [facility] [address], [place], [place] [address]  C-reactive protein     Status: Abnormal   Collection Time: 01/16/22  3:33 AM  Result Value Ref Range   CRP 14.9 (H) <1.0 mg/dL    Comment: Performed at [facility] [address] [place], [place] [address]  Phosphorus     Status: Abnormal   Collection Time: 01/16/22  3:33 AM  Result Value Ref Range   Phosphorus 4.9 (H) 2.5 - 4.6 mg/dL    Comment: Performed at [facility] [address] [place], [place] [address]  Glucose, capillary     Status: Abnormal   Collection Time: 01/16/22  7:48 AM  Result Value Ref Range   Glucose-Capillary 278 (H) 70 - 99 mg/dL  Comment: Glucose reference range applies only to samples taken after fasting for at least 8 hours.    MR LUMBAR SPINE WO CONTRAST  Result Date: 01/15/2022 CLINICAL DATA:  Fall today with left lower extremity  weakness. Known lumbar spine compression deformities. EXAM: MRI LUMBAR SPINE WITHOUT CONTRAST TECHNIQUE: Multiplanar, multisequence MR imaging of the lumbar spine was performed. No intravenous contrast was administered. COMPARISON:  CT lumbar spine 01/13/2022, 10/28/2021 and 09/16/2021. FINDINGS: Despite efforts by the technologist and patient, mild motion artifact is present on today's exam and could not be eliminated. This reduces exam sensitivity and specificity. Segmentation: Conventional anatomy assumed, with the last open disc space designated L5-S1.Concordant with previous imaging. Alignment:  Physiologic. Vertebrae: As demonstrated on recent CT, there are progressive endplate deformities involving the inferior endplate of L2 and superior endplate of L3 compared with previous CT of 09/16/2021. These deformities are associated with adjacent endplate sclerosis and no significant surrounding marrow edema. There is nonspecific T2 hyperintensity within the L2-3 disc. Endplate degenerative changes are also present at L4-5. Previous bilateral laminectomies at L3 and L4. The visualized sacroiliac joints appear unremarkable. Conus medullaris: Extends to the L2 level and appears normal. Paraspinal and other soft tissues: Mild generalized soft tissue edema and muscular atrophy. No focal paraspinal fluid collection identified. Disc levels: Sagittal images demonstrate mild disc degeneration and bulging at T11-12 without resulting spinal stenosis or nerve root encroachment. T12-L1: Normal interspace. L1-2: Mild disc bulging and bilateral facet hypertrophy. No significant spinal stenosis or nerve root encroachment. L2-3: As above, progressive endplate deformities with annular disc bulging and a right paracentral disc protrusion. Moderate facet and ligamentous hypertrophy. Resulting severe spinal stenosis with lateral recess and foraminal narrowing bilaterally. L3-4: Previous posterior decompression. Mild loss of disc height  with annular disc bulging and mild facet hypertrophy. No significant residual spinal stenosis or nerve root encroachment. L4-5: Previous posterior decompression. Loss of disc height with annular disc bulging and a broad-based disc protrusion in the left subarticular zone. Mild bilateral facet hypertrophy. Mild asymmetric left lateral recess and left foraminal narrowing. L5-S1: Chronic degenerative disc disease with loss of disc height, annular disc bulging and endplate osteophytes. Chronic moderate foraminal narrowing bilaterally appears unchanged. IMPRESSION: 1. As seen on recent CT, there are progressive endplate deformities adjacent to the L2-3 disc with associated nonspecific discal hyperintensity. Based on previous studies, findings may reflect progressive subacute fractures with progressive endplate degeneration. Partially treated discitis/osteomyelitis is a consideration, although there is no significant bone marrow edema or paraspinal fluid collection. 2. Associated disc degeneration with annular disc bulging, facet and ligamentous hypertrophy contributing to severe multifactorial spinal stenosis and moderate narrowing of the lateral recesses and foramina bilaterally. 3. Previous posterior decompression at L3-4 and L4-5 without significant recurrent spinal stenosis. 4. Chronic moderate foraminal narrowing bilaterally at L5-S1 due to disc bulging and endplate osteophytes. Electronically Signed   By: Richardean Sale M.D.   On: 01/15/2022 14:17    Review of Systems  HENT:  Negative for mouth sores.   Musculoskeletal:  Positive for back pain.  Neurological:  Positive for weakness and numbness.  Blood pressure 115/74, pulse (!) 104, temperature 97.8 F (36.6 C), temperature source Oral, resp. rate 18, height 6' (1.829 m), weight 93.4 kg, SpO2 97 %. Physical Exam Neurological:     Comments: Patient is awake and alert has returned BKA strength 2/3  Iliopsoas on the left 2 out of 5 quadriceps on the left  4+ out of 5 right iliopsoas and quadricep  Assessment/Plan: 61 year old gentleman with what looks like endplate fractures of Z6-S0 significant fluid that disc base question of trauma versus partial infection difficult to determine on CT and MRI scan.  Also he has severe stenosis at L2-3 which is probably symptomatic level.  Patient will need surgical decompression question is his fracture and stable and is not infected.  Recommend hold the patient's Eliquis arrange interventional CT-guided aspiration of that this space if its not infected we will plan surgery to decompress L2-3 possibly will need some sort of cortical screw fixation at that level as well.  Elaina Hoops 01/16/2022, 7:50 AM

## 2022-01-16 NOTE — Progress Notes (Signed)
PROGRESS NOTE  KYEL PURK YKD:[id number] DOB: 05/19/1961 DOA: 01/13/2022 PCP: [name], MD  HPI/Recap of past 24 hours: Alexander [name] is a 61 y.o. male with medical history significant for ESRD on HD on TTS, HTN, HLD, PVD status post right BKA and left midfoot amputation, Afib on Eliquis, CAD, DM2, and status post pacemaker, presented to the ER with worsening intractable low back pain over the last couple of days with associated left lower extremity radiation and radiculopathy with associated spasm and weakness, denies any urinary or stool incontinence. He has been having chronic low back pain with occasional radiation to both lower extremities. Pt has been followed by spine surgery and was scheduled for LS spine MRI on 02/07/2022.  He was noted to have significant edema with weeping in both lower extremities with recent Ortho follow-up status post his amputation (Dr [name]).  This was managed with compression wraps and close follow up was scheduled on 01/15/22.  Reported falls x2 on 01/13/2022, PTA and noted LLE weakness since that time.  In the ED, BP 104/65 otherwise vital signs. Labs at baseline.  Admitted for further management.     Today, patient continues to complain of significant back pain/spasm with any movement, denies any other new complaints.    Assessment/Plan: Principal Problem:   Lumbar back pain with radiculopathy affecting left lower extremity Active Problems:   ESRD (end stage renal disease) (HCC)   PAF (paroxysmal atrial fibrillation) (HCC)   Essential hypertension, benign   Dyslipidemia   Type 2 diabetes mellitus with chronic kidney disease, with long-term current use of insulin (HCC)   GERD without esophagitis   Pressure injury of skin   Ulcer of right leg, limited to breakdown of skin ([place])   Below-knee amputation of right lower extremity (HCC)   Ulcer of left foot, limited to breakdown of skin ([place])   History of transmetatarsal amputation of left foot  (HCC)    Intermittent lumbar back pain/spasm with radiculopathy affecting LLE Acute on chronic back pain Reports intermittent spasms/pain CRP, ESR elevated CT noted moderate neuroforaminal stenosis at L2-L3 and L5-S1 MRI lumbar spine showed subacute fractures L2-L3 disc, partially treated discitis/osteomyelitis is a consideration, severe multifactorial spinal stenosis at L2-3 Followed by spine surgery Dr Saintclair Halsted, rec disc aspiration by IR to rule out any discitis and plan for surgical decompression IR consulted for disc aspiration to rule out any infection, likely on 01/18/2022 Completed IV decadron Pain management PT/OT- recommending SNF  History of PVD s/p R BKA and left midfoot amputation At outpatient visit on 01/10/2022 with orthopedics, noted BLE with significant weeping, edema with induration, both noted with distal maceration with L LE noted open ulceration Concern for ischemic changes, placed in Unna and Dynaflex compression wraps bilaterally Consulted [name], we will continue with serial compression wraps and outpatient follow-up  Leukocytosis Resolved Currently afebrile BC x2 NGTD Trend WBC  Thrombocytopenia Chronic Daily CBC   ESRD ([name]) HD- T/T/S Nephrology consulted  Anemia of ESRD Baseline hemoglobin between 8-9, currently at baseline Daily CBC   PAF (paroxysmal atrial fibrillation) (HCC) Continue Lopressor and hold Eliquis due to upcoming procedure  Type 2 diabetes mellitus with chronic kidney disease, with long-term current use of insulin ([place]), uncontrolled due to steroids (now completed) Last A1c 6.8 SSI, NovoLog 70/30 twice daily, hypoglycemic protocol, Accu-Cheks   Essential hypertension, benign Continue Lopressor   HLD Continue statin therapy  GERD without esophagitis Continue PPI therapy.     Estimated body mass index  is 28.73 kg/m as calculated from the following:   Height as of this encounter: 6' (1.829 m).   Weight as of this  encounter: 96.1 kg.     Code Status: Full  Family Communication: None at bedside  Disposition Plan: Status is: Inpatient Remains inpatient appropriate because: Level of care      Consultants: Neurosurgery-Dr. Saintclair Halsted Orthopedics-[name] Nephrology IR  Procedures: None  Antimicrobials: None  DVT prophylaxis: Eliquis- held SCD   Objective: Vitals:   01/16/22 1400 01/16/22 1430 01/16/22 1500 01/16/22 1530  BP: 108/78 134/64 121/71 111/69  Pulse: (!) 102 (!) 101 (!) 113 67  Resp:      Temp:      TempSrc:      SpO2:      Weight:      Height:        Intake/Output Summary (Last 24 hours) at 01/16/2022 1604 Last data filed at 01/16/2022 0618 Gross per 24 hour  Intake 200 ml  Output 300 ml  Net -100 ml   Filed Weights   01/13/22 2134 01/14/22 0317 01/16/22 1220  Weight: 107.5 kg 93.4 kg 96.1 kg    Exam: General: NAD, poor dentition Cardiovascular: S1, S2 present Respiratory: CTAB Abdomen: Soft, nontender, nondistended, bowel sounds present Musculoskeletal: Noted right BKA, left midfoot amputation boot dressed in Unna wraps Skin: As mentioned above Psychiatry: Normal mood  Neurology: Limited due to pain, no obvious extremity weakness noted   Data Reviewed: CBC: Recent Labs  Lab 01/13/22 1505 01/14/22 0415 01/15/22 0342 01/16/22 0333  WBC 9.2 12.8* 9.9 7.5  NEUTROABS 6.7  --  7.0 6.1  HGB 10.9* 11.0* 9.5* 9.8*  HCT 36.0* 35.2* 30.7* 30.8*  MCV 100.6* 100.0 99.4 97.8  PLT PLATELET CLUMPS NOTED ON SMEAR, UNABLE TO ESTIMATE 104* 99* 92*   Basic Metabolic Panel: Recent Labs  Lab 01/13/22 1505 01/14/22 0415 01/15/22 0342 01/16/22 0333  NA 135 135 133* 130*  K 3.7 3.9 4.1 4.3  CL 96* 96* 97* 95*  CO2 '28 26 25 24  ' GLUCOSE 157* 153* 145* 181*  BUN 14 17 24* 34*  CREATININE 2.03* 2.58* 3.30* 3.99*  CALCIUM 8.4* 8.4* 8.4* 8.5*  MG 1.7  --   --   --   PHOS  --   --   --  4.9*   GFR: Estimated Creatinine Clearance: 22.8 mL/min (A) (by C-G  formula based on SCr of 3.99 mg/dL (H)). Liver Function Tests: Recent Labs  Lab 01/13/22 1505  AST 25  ALT 19  ALKPHOS 91  BILITOT 0.9  PROT 5.5*  ALBUMIN 2.4*   No results for input(s): LIPASE, AMYLASE in the last 168 hours. No results for input(s): AMMONIA in the last 168 hours. Coagulation Profile: No results for input(s): INR, PROTIME in the last 168 hours. Cardiac Enzymes: No results for input(s): CKTOTAL, CKMB, CKMBINDEX, TROPONINI in the last 168 hours. BNP (last 3 results) No results for input(s): PROBNP in the last 8760 hours. HbA1C: No results for input(s): HGBA1C in the last 72 hours. CBG: Recent Labs  Lab 01/15/22 1138 01/15/22 1703 01/15/22 2032 01/16/22 0748 01/16/22 1158  GLUCAP 137* 135* 119* 278* 258*   Lipid Profile: No results for input(s): CHOL, HDL, LDLCALC, TRIG, CHOLHDL, LDLDIRECT in the last 72 hours. Thyroid Function Tests: No results for input(s): TSH, T4TOTAL, FREET4, T3FREE, THYROIDAB in the last 72 hours. Anemia Panel: No results for input(s): VITAMINB12, FOLATE, FERRITIN, TIBC, IRON, RETICCTPCT in the last 72 hours. Urine analysis:  Component Value Date/Time   COLORURINE YELLOW 05/23/2020 2057   APPEARANCEUR CLEAR 05/23/2020 2057   LABSPEC 1.008 05/23/2020 2057   PHURINE 5.0 05/23/2020 2057   GLUCOSEU 50 (A) 05/23/2020 2057   HGBUR LARGE (A) 05/23/2020 2057   BILIRUBINUR NEGATIVE 05/23/2020 2057   BILIRUBINUR neg 10/01/2014 1445   KETONESUR NEGATIVE 05/23/2020 2057   PROTEINUR 100 (A) 05/23/2020 2057   UROBILINOGEN 0.2 10/01/2014 1445   UROBILINOGEN 0.2 09/30/2014 2051   NITRITE NEGATIVE 05/23/2020 2057   LEUKOCYTESUR NEGATIVE 05/23/2020 2057   Sepsis Labs: '@LABRCNTIP' (procalcitonin:4,lacticidven:4)  ) Recent Results (from the past 240 hour(s))  Resp Panel by RT-PCR (Flu A&B, Covid) Nasopharyngeal Swab     Status: None   Collection Time: 01/14/22  1:13 AM   Specimen: Nasopharyngeal Swab; Nasopharyngeal(NP) swabs in vial  transport medium  Result Value Ref Range Status   SARS Coronavirus 2 by RT PCR NEGATIVE NEGATIVE Final    Comment: (NOTE) SARS-CoV-2 target nucleic acids are NOT DETECTED.  The SARS-CoV-2 RNA is generally detectable in upper respiratory specimens during the acute phase of infection. The lowest concentration of SARS-CoV-2 viral copies this assay can detect is 138 copies/mL. A negative result does not preclude SARS-Cov-2 infection and should not be used as the sole basis for treatment or other patient management decisions. A negative result may occur with  improper specimen collection/handling, submission of specimen other than nasopharyngeal swab, presence of viral mutation(s) within the areas targeted by this assay, and inadequate number of viral copies(<138 copies/mL). A negative result must be combined with clinical observations, patient history, and epidemiological information. The expected result is Negative.  Fact Sheet for Patients:  EntrepreneurPulse.com.au  Fact Sheet for Healthcare Providers:  IncredibleEmployment.be  This test is no t yet approved or cleared by the [place] FDA and  has been authorized for detection and/or diagnosis of SARS-CoV-2 by FDA under an Emergency Use Authorization (EUA). This EUA will remain  in effect (meaning this test can be used) for the duration of the COVID-19 declaration under Section 564(b)(1) of the Act, 21 U.S.C.section 360bbb-3(b)(1), unless the authorization is terminated  or revoked sooner.       Influenza A by PCR NEGATIVE NEGATIVE Final   Influenza B by PCR NEGATIVE NEGATIVE Final    Comment: (NOTE) The Xpert Xpress SARS-CoV-2/FLU/RSV plus assay is intended as an aid in the diagnosis of influenza from Nasopharyngeal swab specimens and should not be used as a sole basis for treatment. Nasal washings and aspirates are unacceptable for Xpert Xpress SARS-CoV-2/FLU/RSV testing.  Fact  Sheet for Patients: EntrepreneurPulse.com.au  Fact Sheet for Healthcare Providers: IncredibleEmployment.be  This test is not yet approved or cleared by the [place] FDA and has been authorized for detection and/or diagnosis of SARS-CoV-2 by FDA under an Emergency Use Authorization (EUA). This EUA will remain in effect (meaning this test can be used) for the duration of the COVID-19 declaration under Section 564(b)(1) of the Act, 21 U.S.C. section 360bbb-3(b)(1), unless the authorization is terminated or revoked.  Performed at [facility], [place] [address] [place], [place] [address]   MRSA Next Gen by PCR, Nasal     Status: None   Collection Time: 01/14/22  7:18 AM   Specimen: Nasal Mucosa; Nasal Swab  Result Value Ref Range Status   MRSA by PCR Next Gen NOT DETECTED NOT DETECTED Final    Comment: (NOTE) The GeneXpert MRSA Assay (FDA approved for NASAL specimens only), is one component of a comprehensive MRSA colonization  surveillance program. It is not intended to diagnose MRSA infection nor to guide or monitor treatment for MRSA infections. Test performance is not FDA approved in patients less than 73 years old. Performed at [facility], [place] [address], [place], [place] [address]   Culture, blood (routine x 2)     Status: None (Preliminary result)   Collection Time: 01/14/22  3:24 PM   Specimen: BLOOD LEFT WRIST  Result Value Ref Range Status   Specimen Description BLOOD LEFT WRIST  Final   Special Requests   Final    BOTTLES DRAWN AEROBIC AND ANAEROBIC Blood Culture results may not be optimal due to an inadequate volume of blood received in culture bottles   Culture   Final    NO GROWTH 2 DAYS Performed at [facility], [place] [address], [place], [place] [address]    Report Status PENDING  Incomplete  Culture, blood (routine x 2)     Status: None (Preliminary result)   Collection Time: 01/14/22  3:33 PM    Specimen: BLOOD LEFT FOREARM  Result Value Ref Range Status   Specimen Description BLOOD LEFT FOREARM  Final   Special Requests   Final    BOTTLES DRAWN AEROBIC AND ANAEROBIC Blood Culture adequate volume   Culture   Final    NO GROWTH 2 DAYS Performed at [facility], [place] [address], [place], [place] [address]    Report Status PENDING  Incomplete      Studies: No results found.  Scheduled Meds:  atorvastatin  20 mg Oral QHS   calcitRIOL  0.25 mcg Oral Daily   Chlorhexidine Gluconate Cloth  6 each Topical Daily   dexamethasone (DECADRON) injection  10 mg Intravenous Q6H   furosemide  80 mg Oral Once per day on Sun Mon Wed Fri   insulin aspart  0-6 Units Subcutaneous TID WC   insulin aspart protamine- aspart  16 Units Subcutaneous BID WC   lidocaine  1 patch Transdermal Q24H   metoprolol tartrate  25 mg Oral BID   multivitamin with minerals  1 tablet Oral Once per day on Mon Thu   pantoprazole  40 mg Oral Daily    Continuous Infusions:  sodium chloride     sodium chloride       LOS: 3 days     Alma Friendly, MD [facility]  If 7PM-7AM, please contact night-coverage [email or web address] 01/16/2022, 4:04 PM

## 2022-01-16 NOTE — Consult Note (Signed)
ORTHOPAEDIC CONSULTATION  REQUESTING PHYSICIAN: Alma Friendly, MD  Chief Complaint: Weakness left lower extremity.  HPI: Alexander [name] is a 61 y.o. male who presents with ulcerations to both lower extremities patient has extensive ulcerations to the left transmetatarsal amputation and right transtibial amputation.  Patient states that he has been wearing his prosthesis on the right without problems has been ambulating and has had acute onset of swelling and ulceration in both lower extremities.  Patient states that with his back pain he cannot move his left leg.  Past Medical History:  Diagnosis Date   Acute blood loss anemia    Alcohol abuse    Anemia    Anemia of chronic disease    Arthritis    "patient does not think so."   Atrial fibrillation ([place])    Cardiac syncope 10/07/2014   rapid A fib with 8 sec pauses on converison with syncope- temp pacing wire placed then PPM   Cataract    BILATERAL   Coronary artery disease    Diabetes mellitus    dx---been  awhile   Elevated LFTs    Endocarditis of tricuspid valve    ESRD (end stage renal disease) ([place])    [place]   GERD (gastroesophageal reflux disease)    History of blood transfusion    Hyperlipemia    Hypertension    Malnutrition ([place])    Osteomyelitis ([place])    right foot   Peripheral vascular disease ([place])    Presence of permanent cardiac pacemaker 10/08/2014   [employer]   Tricuspid valve vegetation    Past Surgical History:  Procedure Laterality Date   A/V FISTULAGRAM Right 05/01/2021   Procedure: A/V FISTULAGRAM;  Surgeon: Waynetta Sandy, MD;  Location: [facility];  Service: Cardiovascular;  Laterality: Right;  failed fistulagram   ABDOMINAL AORTOGRAM W/LOWER EXTREMITY N/A 09/09/2017   Procedure: ABDOMINAL AORTOGRAM W/LOWER EXTREMITY;  Surgeon: Angelia Mould, MD;  Location: [facility];  Service: Cardiovascular;  Laterality: N/A;   ABDOMINAL  AORTOGRAM W/LOWER EXTREMITY Right 11/11/2019   Procedure: ABDOMINAL AORTOGRAM W/LOWER EXTREMITY;  Surgeon: Marty Heck, MD;  Location: [facility];  Service: Cardiovascular;  Laterality: Right;   AMPUTATION Left 08/19/2013   Procedure: AMPUTATION RAY;  Surgeon: Alta Corning, MD;  Location: [facility];  Service: Orthopedics;  Laterality: Left;  ray amputation left 5th   AMPUTATION Left 10/27/2013   Procedure: AMPUTATION DIGIT-LEFT 4TH TOE, 4th and 5th metatarsal.;  Surgeon: Angelia Mould, MD;  Location: [facility];  Service: Vascular;  Laterality: Left;   AMPUTATION Left 11/04/2013   Procedure: LEFT FOOT TRANS-METATARSAL AMPUTATION WITH WOUND CLOSURE ;  Surgeon: Alta Corning, MD;  Location: [place];  Service: Orthopedics;  Laterality: Left;   AMPUTATION Right 09/10/2017   Procedure: RIGHT FOURTH AND FIFTH TOE AMPUTATION;  Surgeon: Angelia Mould, MD;  Location: [place];  Service: Vascular;  Laterality: Right;   AMPUTATION Right 02/19/2020   Procedure: AMPUTATION RIGHT 3RD TOE;  Surgeon: Angelia Mould, MD;  Location: [place];  Service: Vascular;  Laterality: Right;   AMPUTATION Right 03/11/2020   Procedure: RIGHT BELOW KNEE AMPUTATION;  Surgeon: Newt Minion, MD;  Location: [place];  Service: Orthopedics;  Laterality: Right;   APPLICATION OF WOUND VAC Right 02/19/2020   Procedure: Application Of Wound Vac Right foot;  Surgeon: Angelia Mould, MD;  Location: [place];  Service: Vascular;  Laterality: Right;   AV  FISTULA PLACEMENT Right 03/27/2021   Procedure: RIGHT ARM ARTERIOVENOUS (AV) FISTULA;  Surgeon: Angelia Mould, MD;  Location: [place];  Service: Vascular;  Laterality: Right;   AV FISTULA PLACEMENT Right 05/10/2021   Procedure: RIGHT ARM ARTERIOVENOUS (AV) FISTULA;  Surgeon: Waynetta Sandy, MD;  Location: [place];  Service: Vascular;  Laterality: Right;   [place] Right 08/11/2021   Procedure: RIGHT ARM  SECOND STAGE BASILIC VEIN TRANSPOSITION;  Surgeon: Waynetta Sandy, MD;  Location: [place];  Service: Vascular;  Laterality: Right;   BIOPSY  07/20/2020   Procedure: BIOPSY;  Surgeon: Irving Copas., MD;  Location: [facility];  Service: Gastroenterology;;   CARDIAC CATHETERIZATION  10/07/2014   Procedure: LEFT HEART CATH AND CORONARY ANGIOGRAPHY;  Surgeon: Laverda Page, MD;  Location: [facility] C[facility] LAB;  Service: Cardiovascular;;   COLONOSCOPY W/ POLYPECTOMY     COLONOSCOPY WITH PROPOFOL N/A 05/25/2020   Procedure: COLONOSCOPY WITH PROPOFOL;  Surgeon: Gatha Mayer, MD;  Location: [facility];  Service: Endoscopy;  Laterality: N/A;   EMBOLECTOMY Right 09/12/2017   Procedure: Thrombectomy  & Redo Right Below Knee Popliteal Artery Bypass Graft  ;  Surgeon: Waynetta Sandy, MD;  Location: [facility] OR;  Service: Vascular;  Laterality: Right;   ENTEROSCOPY N/A 07/20/2020   Procedure: ENTEROSCOPY;  Surgeon: Rush Landmark Telford Nab., MD;  Location: [place];  Service: Gastroenterology;  Laterality: N/A;   ESOPHAGOGASTRODUODENOSCOPY (EGD) WITH PROPOFOL N/A 05/24/2020   Procedure: ESOPHAGOGASTRODUODENOSCOPY (EGD) WITH PROPOFOL;  Surgeon: Ladene Artist, MD;  Location: [facility];  Service: Endoscopy;  Laterality: N/A;   EYE SURGERY Bilateral    cataract   FEMORAL-TIBIAL BYPASS GRAFT Left 10/27/2013   Procedure: BYPASS GRAFT LEFT FEMORAL- POSTERIOR TIBIAL ARTERY;  Surgeon: Angelia Mould, MD;  Location: [place];  Service: Vascular;  Laterality: Left;   FEMORAL-TIBIAL BYPASS GRAFT Right 09/10/2017   Procedure: RIGHT SUPERFICIAL  FEMORAL ARTERY-BELOW KNEE POPLITEAL ARTERY BYPASS GRAFT WITH VEIN;  Surgeon: Angelia Mould, MD;  Location: [place];  Service: Vascular;  Laterality: Right;   GIVENS CAPSULE STUDY N/A 07/20/2020   Procedure: GIVENS CAPSULE STUDY;  Surgeon: Irving Copas., MD;  Location: [place];  Service: Gastroenterology;  Laterality: N/A;   capsule placed through scope into duodenum @ 0936   I & D EXTREMITY Left 10/27/2013   Procedure: IRRIGATION AND DEBRIDEMENT EXTREMITY- LEFT FOOT;  Surgeon: Angelia Mould, MD;  Location: [place];  Service: Vascular;  Laterality: Left;   I & D EXTREMITY Right 01/26/2020   Procedure: IRRIGATION AND DEBRIDEMENT EXTREMITY RIGHT FOOT WOUND;  Surgeon: Angelia Mould, MD;  Location: [place];  Service: Vascular;  Laterality: Right;   INSERT / REPLACE / REMOVE PACEMAKER     INSERTION OF DIALYSIS CATHETER Right 11/03/2021   Procedure: RIGHT INTERNAL JUGULAR INSERTION OF TUNNELED DIALYSIS CATHETER;  Surgeon: Angelia Mould, MD;  Location: [place];  Service: Vascular;  Laterality: Right;   INTRAOPERATIVE ARTERIOGRAM Right 09/10/2017   Procedure: INTRA OPERATIVE ARTERIOGRAM;  Surgeon: Angelia Mould, MD;  Location: [place];  Service: Vascular;  Laterality: Right;   IR ANGIOGRAM FOLLOW UP STUDY  09/09/2017   IR FLUORO GUIDE CV LINE RIGHT  05/31/2020   IR US GUIDE VASC ACCESS RIGHT  06/01/2020   LEFT HEART CATH AND CORONARY ANGIOGRAPHY N/A 08/19/2020   Procedure: LEFT HEART CATH AND CORONARY ANGIOGRAPHY;  Surgeon: Adrian Prows, MD;  Location: [facility];  Service: Cardiovascular;  Laterality: N/A;   LEFT HEART CATHETERIZATION WITH CORONARY ANGIOGRAM N/A 11/09/2014   Procedure: LEFT HEART CATHETERIZATION WITH CORONARY ANGIOGRAM;  Surgeon: Laverda Page, MD;  Location: [facility] C[facility] LAB;  Service: Cardiovascular;  Laterality: N/A;   LOWER EXTREMITY ANGIOGRAM Right 11/08/2015   Procedure: Lower Extremity Angiogram;  Surgeon: Serafina Mitchell, MD;  Location: [facility] [facility];  Service: Cardiovascular;  Laterality: Right;   LUMBAR LAMINECTOMY  06/13/2012   Procedure: MICRODISCECTOMY LUMBAR LAMINECTOMY;  Surgeon: Jessy Oto, MD;  Location: [place];  Service: Orthopedics;  Laterality: N/A;  Central laminectomy L2-3, L3-4, L4-5   PACEMAKER INSERTION  10/08/2014   MDT Advisa MRI compatible  dual chamber pacemaker implanted by Dr Caryl Comes for syncope with post-termination pauses   PERCUTANEOUS CORONARY STENT INTERVENTION (PCI-S)  11/09/2014   des to lad & distal circumflex         Dr  [name]   PERIPHERAL VASCULAR BALLOON ANGIOPLASTY Right 11/11/2019   Procedure: PERIPHERAL VASCULAR BALLOON ANGIOPLASTY;  Surgeon: Marty Heck, MD;  Location: [place] [facility];  Service: Cardiovascular;  Laterality: Right;  below knee popliteal, tibioperoneal trunk, posterior tibial arteries   PERIPHERAL VASCULAR CATHETERIZATION N/A 11/08/2015   Procedure: Abdominal Aortogram;  Surgeon: Serafina Mitchell, MD;  Location: [place] [facility];  Service: Cardiovascular;  Laterality: N/A;   PERIPHERAL VASCULAR CATHETERIZATION Right 11/08/2015   Procedure: Peripheral Vascular Atherectomy;  Surgeon: Serafina Mitchell, MD;  Location: [place];  Service: Cardiovascular;  Laterality: Right;   PERIPHERAL VASCULAR CATHETERIZATION N/A 10/10/2016   Procedure: Lower Extremity Angiography;  Surgeon: Waynetta Sandy, MD;  Location: [facility] [facility];  Service: Cardiovascular;  Laterality: N/A;   PERIPHERAL VASCULAR CATHETERIZATION Right 10/10/2016   Procedure: Peripheral Vascular Atherectomy;  Surgeon: Waynetta Sandy, MD;  Location: [place];  Service: Cardiovascular;  Laterality: Right;  Popliteal   PERMANENT PACEMAKER INSERTION N/A 10/08/2014   Procedure: PERMANENT PACEMAKER INSERTION;  Surgeon: Deboraha Sprang, MD;  Location: [facility] C[facility] LAB;  Service: Cardiovascular;  Laterality: N/A;   SUBMUCOSAL TATTOO INJECTION  07/20/2020   Procedure: SUBMUCOSAL TATTOO INJECTION;  Surgeon: Irving Copas., MD;  Location: [place];  Service: Gastroenterology;;   TEE WITHOUT CARDIOVERSION N/A 10/27/2021   Procedure: TRANSESOPHAGEAL ECHOCARDIOGRAM (TEE);  Surgeon: Adrian Prows, MD;  Location: [facility] ENDOSCOPY;  Service: Cardiovascular;  Laterality: N/A;   TEMPORARY PACEMAKER INSERTION  Bilateral 10/07/2014   Procedure: TEMPORARY PACEMAKER INSERTION;  Surgeon: Laverda Page, MD;  Location: [facility] CATH LAB;  Service: Cardiovascular;  Laterality: Bilateral;   WOUND DEBRIDEMENT Right 02/19/2020   Procedure: DEBRIDEMENT WOUND RIGHT FOOT;  Surgeon: Angelia Mould, MD;  Location: [facility] OR;  Service: Vascular;  Laterality: Right;   Social History   Socioeconomic History   Marital status: Married    Spouse name: Not on file   Number of children: 2   Years of education: Not on file   Highest education level: Not on file  Occupational History   Occupation: Disabled  Tobacco Use   Smoking status: Former    Packs/day: 1.50    Years: 30.00    Pack years: 45.00    Types: Cigarettes    Quit date: 10/07/2014    Years since quitting: 7.2   Smokeless tobacco: Never   Tobacco comments:    Does an occasional cigar  Vaping Use   Vaping Use: Never used  Substance and Sexual Activity   Alcohol use: Not Currently    Alcohol/week: 21.0  standard drinks    Types: 21 Glasses of wine per week    Comment: cut way back, none in the last month   Drug use: No   Sexual activity: Not on file  Other Topics Concern   Not on file  Social History Narrative   Not on file   Social Determinants of Health   Financial Resource Strain: Not on file  Food Insecurity: Not on file  Transportation Needs: Not on file  Physical Activity: Not on file  Stress: Not on file  Social Connections: Not on file   Family History  Problem Relation Age of Onset   Diabetes type II Mother    Hypertension Mother    Diabetes Mother    Liver cancer Father    Diabetes type II Sister    Breast cancer Sister    Diabetes Sister    Hypertension Sister    Diabetes type II Brother    Kidney failure Brother    Diabetes Brother    Hypertension Brother    Diabetes type II Sister    - negative except otherwise stated in the family history section Allergies  Allergen Reactions   Cytoxan [Cyclophosphamide]  Other (See Comments)    Pancytopenia   Pletal [Cilostazol] Palpitations and Other (See Comments)    "Can hear heart beating loudly".   Keflex [Cephalexin] Nausea And Vomiting   Tylenol [Acetaminophen] Other (See Comments)    Patient has a cold sweat   Prior to Admission medications   Medication Sig Start Date End Date Taking? Authorizing Provider  amoxicillin (AMOXIL) 500 MG capsule Take 1 capsule (500 mg total) by mouth at bedtime. 11/22/21 11/17/22 Yes Vu, Rockey Situ, MD  apixaban (ELIQUIS) 5 MG TABS tablet Take 1 tablet (5 mg total) by mouth 2 (two) times daily. 12/11/21 03/11/22 Yes Cantwell, Celeste C, PA-C  atorvastatin (LIPITOR) 20 MG tablet Take 1 tablet (20 mg total) by mouth at bedtime. 10/01/21  Yes Aline August, MD  doxycycline (VIBRA-TABS) 100 MG tablet Take 1 tablet (100 mg total) by mouth 2 (two) times daily. 11/22/21 11/17/22 Yes Vu, Rockey Situ, MD  furosemide (LASIX) 80 MG tablet Take 80 mg by mouth See admin instructions. Take 80 mg by mouth on (non-dialysis days) Mondays, Wednesdays, Fridays & Sundays in the morning as needed "to cause urination" 02/26/21  Yes [provider]  ibuprofen (ADVIL) 800 MG tablet Take 800 mg by mouth every 8 (eight) hours as needed for moderate pain.   Yes [provider]  lidocaine-prilocaine (EMLA) cream Apply 1 application topically every dialysis (on Tues/Thurs/Sat).   Yes [provider]  metoprolol tartrate (LOPRESSOR) 25 MG tablet Take 1 tablet (25 mg total) by mouth 2 (two) times daily. 05/24/21 05/19/22 Yes Adrian Prows, MD  Multiple Vitamin (MULTIVITAMIN WITH MINERALS) TABS tablet Take 1 tablet by mouth 2 (two) times a week.   Yes [provider]  NOVOLIN 70/30 (70-30) 100 UNIT/ML injection Inject 16 Units into the skin 2 (two) times daily with a meal. Patient taking differently: Inject 10-20 Units into the skin 2 (two) times daily as needed (high blood sugar). 11/06/21  Yes Patrecia Pour, MD  oxyCODONE-acetaminophen  (PERCOCET/ROXICET) 5-325 MG tablet Take 1 tablet by mouth every 8 (eight) hours as needed for moderate pain or severe pain. 01/11/22  Yes Newt Minion, MD  pantoprazole (PROTONIX) 40 MG tablet Take 40 mg by mouth daily.   Yes [provider]  ACCU-CHEK FASTCLIX LANCETS MISC Check blood sugar TID &  QHS 10/01/14   Lorayne Marek, MD  blood glucose meter kit and supplies KIT Check blood sugar TID & QHS 02/10/15   Advani, Vernon Prey, MD  Blood Glucose Monitoring Suppl (ACCU-CHEK ADVANTAGE DIABETES) kit Use as instructed 10/01/14   Lorayne Marek, MD  glucose blood (ACCU-CHEK AVIVA PLUS) test strip Use as instructed 10/01/14   Advani, Vernon Prey, MD  glucose blood (CHOICE DM FORA G20 TEST STRIPS) test strip Check blood sugar TID & QHS 02/10/15   Advani, Deepak, MD  glucose blood test strip Use as instructed 11/20/13   Reyne Dumas, MD  methylPREDNISolone (MEDROL DOSEPAK) 4 MG TBPK tablet See admin instructions. 12/20/21   [provider]   MR LUMBAR SPINE WO CONTRAST  Result Date: 01/15/2022 CLINICAL DATA:  Fall today with left lower extremity weakness. Known lumbar spine compression deformities. EXAM: MRI LUMBAR SPINE WITHOUT CONTRAST TECHNIQUE: Multiplanar, multisequence MR imaging of the lumbar spine was performed. No intravenous contrast was administered. COMPARISON:  CT lumbar spine 01/13/2022, 10/28/2021 and 09/16/2021. FINDINGS: Despite efforts by the technologist and patient, mild motion artifact is present on today's exam and could not be eliminated. This reduces exam sensitivity and specificity. Segmentation: Conventional anatomy assumed, with the last open disc space designated L5-S1.Concordant with previous imaging. Alignment:  Physiologic. Vertebrae: As demonstrated on recent CT, there are progressive endplate deformities involving the inferior endplate of L2 and superior endplate of L3 compared with previous CT of 09/16/2021. These deformities are associated with adjacent endplate sclerosis  and no significant surrounding marrow edema. There is nonspecific T2 hyperintensity within the L2-3 disc. Endplate degenerative changes are also present at L4-5. Previous bilateral laminectomies at L3 and L4. The visualized sacroiliac joints appear unremarkable. Conus medullaris: Extends to the L2 level and appears normal. Paraspinal and other soft tissues: Mild generalized soft tissue edema and muscular atrophy. No focal paraspinal fluid collection identified. Disc levels: Sagittal images demonstrate mild disc degeneration and bulging at T11-12 without resulting spinal stenosis or nerve root encroachment. T12-L1: Normal interspace. L1-2: Mild disc bulging and bilateral facet hypertrophy. No significant spinal stenosis or nerve root encroachment. L2-3: As above, progressive endplate deformities with annular disc bulging and a right paracentral disc protrusion. Moderate facet and ligamentous hypertrophy. Resulting severe spinal stenosis with lateral recess and foraminal narrowing bilaterally. L3-4: Previous posterior decompression. Mild loss of disc height with annular disc bulging and mild facet hypertrophy. No significant residual spinal stenosis or nerve root encroachment. L4-5: Previous posterior decompression. Loss of disc height with annular disc bulging and a broad-based disc protrusion in the left subarticular zone. Mild bilateral facet hypertrophy. Mild asymmetric left lateral recess and left foraminal narrowing. L5-S1: Chronic degenerative disc disease with loss of disc height, annular disc bulging and endplate osteophytes. Chronic moderate foraminal narrowing bilaterally appears unchanged. IMPRESSION: 1. As seen on recent CT, there are progressive endplate deformities adjacent to the L2-3 disc with associated nonspecific discal hyperintensity. Based on previous studies, findings may reflect progressive subacute fractures with progressive endplate degeneration. Partially treated discitis/osteomyelitis is a  consideration, although there is no significant bone marrow edema or paraspinal fluid collection. 2. Associated disc degeneration with annular disc bulging, facet and ligamentous hypertrophy contributing to severe multifactorial spinal stenosis and moderate narrowing of the lateral recesses and foramina bilaterally. 3. Previous posterior decompression at L3-4 and L4-5 without significant recurrent spinal stenosis. 4. Chronic moderate foraminal narrowing bilaterally at L5-S1 due to disc bulging and endplate osteophytes. Electronically Signed   By: Richardean Sale M.D.   On: 01/15/2022 14:17   -  pertinent xrays, CT, MRI studies were reviewed and independently interpreted  Positive ROS: All other systems have been reviewed and were otherwise negative with the exception of those mentioned in the HPI and as above.  Physical Exam: General: Alert, no acute distress Psychiatric: Patient is competent for consent with normal mood and affect Lymphatic: No axillary or cervical lymphadenopathy Cardiovascular: No pedal edema Respiratory: No cyanosis, no use of accessory musculature GI: No organomegaly, abdomen is soft and non-tender    Images:  _0 @  Labs:  Lab Results  Component Value Date   HGBA1C 6.8 (H) 10/29/2021   HGBA1C 5.5 05/23/2020   HGBA1C 8.6 (H) 01/27/2020   ESRSEDRATE 30 (H) 01/16/2022   ESRSEDRATE 21 (H) 01/15/2022   CRP 14.9 (H) 01/16/2022   CRP 14.0 (H) 01/15/2022   REPTSTATUS PENDING 01/14/2022   GRAMSTAIN  01/26/2020    RARE WBC PRESENT,BOTH PMN AND MONONUCLEAR FEW GRAM POSITIVE COCCI IN PAIRS    CULT  01/14/2022    NO GROWTH < 24 HOURS Performed at [facility], [address] [place], [place] [address]    LABORGA STREPTOCOCCUS MITIS/ORALIS 01/26/2020    Lab Results  Component Value Date   ALBUMIN 2.4 (L) 01/13/2022   ALBUMIN 2.9 (L) 12/20/2021   ALBUMIN 3.0 (L) 12/06/2021     CBC EXTENDED Latest Ref Rng & Units 01/16/2022 01/15/2022 01/14/2022  WBC  4.0 - 10.5 K/uL 7.5 9.9 12.8(H)  RBC 4.22 - 5.81 MIL/uL 3.15(L) 3.09(L) 3.52(L)  HGB 13.0 - 17.0 g/dL 9.8(L) 9.5(L) 11.0(L)  HCT 39.0 - 52.0 % 30.8(L) 30.7(L) 35.2(L)  PLT 150 - 400 K/uL 92(L) 99(L) 104(L)  NEUTROABS 1.7 - 7.7 K/uL 6.1 7.0 -  LYMPHSABS 0.7 - 4.0 K/uL 1.2 2.1 -    Neurologic: Patient does not have protective sensation bilateral lower extremities.   MUSCULOSKELETAL:   Skin: Examination photographs show ulcerations circumferentially of the right transtibial amputation and left midfoot amputation there is ischemic superficial skin changes with dermatitis.  There are no full-thickness ulcers.  Albumin 2.4.  White cell count 7.5.  Hemoglobin 9.8.  There is no drainage through the dressings bilaterally.  Assessment: Assessment: Ulceration right transtibial amputation and left midfoot amputation with weakness and inability to move his left leg.  Plan: Plan: We will continue with the serial compression wraps I will follow-up in the office at discharge patient will need a new socket for the right transtibial amputation.  Thank you for the consult and the opportunity to see Mr. Zymire Turnbo, [place] [phone] 7:13 AM

## 2022-01-16 NOTE — Consult Note (Signed)
Chief Complaint: Patient was seen in consultation today for L2-3 disc aspiration  Chief Complaint  Patient presents with   Back Pain   Weakness   at the request of Lesia Sago   Referring Physician(s): Lesia Sago   Supervising Physician: Pedro Earls  Patient Status: [facility] - In-pt  History of Present Illness: Alexander [name] is a 61 y.o. male with PMHs of HTN, HLD, DM, ESRD, A fib, PVD on Eliquis, CAD, s/p pacemaker placement who presented to [facility] ED on 3/4  with onset of intractable low back pain over the last couple of days with associated left lower extremity radiation and radiculopathy with associated spasm and weakness. Patient was hospitalized due to intractable lower back pain. MRI on 01/15/22 showed:   1. As seen on recent CT, there are progressive endplate deformities adjacent to the L2-3 disc with associated nonspecific discal hyperintensity. Based on previous studies, findings may reflect progressive subacute fractures with progressive endplate degeneration. Partially treated discitis/osteomyelitis is a consideration, although there is no significant bone marrow edema or paraspinal fluid collection. 2. Associated disc degeneration with annular disc bulging, facet and ligamentous hypertrophy contributing to severe multifactorial spinal stenosis and moderate narrowing of the lateral recesses and foramina bilaterally. 3. Previous posterior decompression at L3-4 and L4-5 without significant recurrent spinal stenosis. 4. Chronic moderate foraminal narrowing bilaterally at L5-S1 due to disc bulging and endplate osteophytes.  Patient is known to Dr. Saintclair Halsted from neuro surgery and he has been consulted to see the patient during this admission. Dr. Saintclair Halsted recommended L2-3 disc aspiration before proceeding to anticipated back surgery.   NIR was requested for L2-3 disc aspiration.   Patient laying in bed, not in acute distress. Receiving dialysis.   Reports moderate back pain, better the moment as he is laying still.  Denise headache, fever, chills, shortness of breath, cough, chest pain, abdominal pain, nausea ,vomiting, and bleeding.    Past Medical History:  Diagnosis Date   Acute blood loss anemia    Alcohol abuse    Anemia    Anemia of chronic disease    Arthritis    "patient does not think so."   Atrial fibrillation ([place])    Cardiac syncope 10/07/2014   rapid A fib with 8 sec pauses on converison with syncope- temp pacing wire placed then PPM   Cataract    BILATERAL   Coronary artery disease    Diabetes mellitus    dx---been  awhile   Elevated LFTs    Endocarditis of tricuspid valve    ESRD (end stage renal disease) ([place])    [place]   GERD (gastroesophageal reflux disease)    History of blood transfusion    Hyperlipemia    Hypertension    Malnutrition ([place])    Osteomyelitis ([place])    right foot   Peripheral vascular disease ([place])    Presence of permanent cardiac pacemaker 10/08/2014   [employer]   Tricuspid valve vegetation     Past Surgical History:  Procedure Laterality Date   A/V FISTULAGRAM Right 05/01/2021   Procedure: A/V FISTULAGRAM;  Surgeon: Waynetta Sandy, MD;  Location: [place] [facility];  Service: Cardiovascular;  Laterality: Right;  failed fistulagram   ABDOMINAL AORTOGRAM W/LOWER EXTREMITY N/A 09/09/2017   Procedure: ABDOMINAL AORTOGRAM W/LOWER EXTREMITY;  Surgeon: Angelia Mould, MD;  Location: [place] [facility];  Service: Cardiovascular;  Laterality: N/A;   ABDOMINAL AORTOGRAM W/LOWER EXTREMITY Right 11/11/2019   Procedure: ABDOMINAL AORTOGRAM  W/LOWER EXTREMITY;  Surgeon: Marty Heck, MD;  Location: [place] [facility];  Service: Cardiovascular;  Laterality: Right;   AMPUTATION Left 08/19/2013   Procedure: AMPUTATION RAY;  Surgeon: Alta Corning, MD;  Location: [place];  Service: Orthopedics;  Laterality: Left;  ray amputation left 5th   AMPUTATION  Left 10/27/2013   Procedure: AMPUTATION DIGIT-LEFT 4TH TOE, 4th and 5th metatarsal.;  Surgeon: Angelia Mould, MD;  Location: [place];  Service: Vascular;  Laterality: Left;   AMPUTATION Left 11/04/2013   Procedure: LEFT FOOT TRANS-METATARSAL AMPUTATION WITH WOUND CLOSURE ;  Surgeon: Alta Corning, MD;  Location: [place];  Service: Orthopedics;  Laterality: Left;   AMPUTATION Right 09/10/2017   Procedure: RIGHT FOURTH AND FIFTH TOE AMPUTATION;  Surgeon: Angelia Mould, MD;  Location: [place];  Service: Vascular;  Laterality: Right;   AMPUTATION Right 02/19/2020   Procedure: AMPUTATION RIGHT 3RD TOE;  Surgeon: Angelia Mould, MD;  Location: [place];  Service: Vascular;  Laterality: Right;   AMPUTATION Right 03/11/2020   Procedure: RIGHT BELOW KNEE AMPUTATION;  Surgeon: Newt Minion, MD;  Location: [place];  Service: Orthopedics;  Laterality: Right;   APPLICATION OF WOUND VAC Right 02/19/2020   Procedure: Application Of Wound Vac Right foot;  Surgeon: Angelia Mould, MD;  Location: [place];  Service: Vascular;  Laterality: Right;   AV FISTULA PLACEMENT Right 03/27/2021   Procedure: RIGHT ARM ARTERIOVENOUS (AV) FISTULA;  Surgeon: Angelia Mould, MD;  Location: [facility];  Service: Vascular;  Laterality: Right;   AV FISTULA PLACEMENT Right 05/10/2021   Procedure: RIGHT ARM ARTERIOVENOUS (AV) FISTULA;  Surgeon: Waynetta Sandy, MD;  Location: [place];  Service: Vascular;  Laterality: Right;   [place] Right 08/11/2021   Procedure: RIGHT ARM SECOND STAGE BASILIC VEIN TRANSPOSITION;  Surgeon: Waynetta Sandy, MD;  Location: [place];  Service: Vascular;  Laterality: Right;   BIOPSY  07/20/2020   Procedure: BIOPSY;  Surgeon: Irving Copas., MD;  Location: [place];  Service: Gastroenterology;;   CARDIAC CATHETERIZATION  10/07/2014   Procedure: LEFT HEART CATH AND CORONARY ANGIOGRAPHY;  Surgeon: Laverda Page,  MD;  Location: [facility] CATH LAB;  Service: Cardiovascular;;   COLONOSCOPY W/ POLYPECTOMY     COLONOSCOPY WITH PROPOFOL N/A 05/25/2020   Procedure: COLONOSCOPY WITH PROPOFOL;  Surgeon: Gatha Mayer, MD;  Location: [facility];  Service: Endoscopy;  Laterality: N/A;   EMBOLECTOMY Right 09/12/2017   Procedure: Thrombectomy  & Redo Right Below Knee Popliteal Artery Bypass Graft  ;  Surgeon: Waynetta Sandy, MD;  Location: [facility] OR;  Service: Vascular;  Laterality: Right;   ENTEROSCOPY N/A 07/20/2020   Procedure: ENTEROSCOPY;  Surgeon: Rush Landmark Telford Nab., MD;  Location: [place];  Service: Gastroenterology;  Laterality: N/A;   ESOPHAGOGASTRODUODENOSCOPY (EGD) WITH PROPOFOL N/A 05/24/2020   Procedure: ESOPHAGOGASTRODUODENOSCOPY (EGD) WITH PROPOFOL;  Surgeon: Ladene Artist, MD;  Location: [facility];  Service: Endoscopy;  Laterality: N/A;   EYE SURGERY Bilateral    cataract   FEMORAL-TIBIAL BYPASS GRAFT Left 10/27/2013   Procedure: BYPASS GRAFT LEFT FEMORAL- POSTERIOR TIBIAL ARTERY;  Surgeon: Angelia Mould, MD;  Location: [place];  Service: Vascular;  Laterality: Left;   FEMORAL-TIBIAL BYPASS GRAFT Right 09/10/2017   Procedure: RIGHT SUPERFICIAL  FEMORAL ARTERY-BELOW KNEE POPLITEAL ARTERY BYPASS GRAFT WITH VEIN;  Surgeon: Angelia Mould, MD;  Location: [place];  Service: Vascular;  Laterality: Right;   GIVENS  CAPSULE STUDY N/A 07/20/2020   Procedure: GIVENS CAPSULE STUDY;  Surgeon: Irving Copas., MD;  Location: [place];  Service: Gastroenterology;  Laterality: N/A;  capsule placed through scope into duodenum @ 0936   I & D EXTREMITY Left 10/27/2013   Procedure: IRRIGATION AND DEBRIDEMENT EXTREMITY- LEFT FOOT;  Surgeon: Angelia Mould, MD;  Location: [place];  Service: Vascular;  Laterality: Left;   I & D EXTREMITY Right 01/26/2020   Procedure: IRRIGATION AND DEBRIDEMENT EXTREMITY RIGHT FOOT WOUND;  Surgeon: Angelia Mould, MD;  Location: [place];  Service: Vascular;  Laterality: Right;   INSERT / REPLACE / REMOVE PACEMAKER     INSERTION OF DIALYSIS CATHETER Right 11/03/2021   Procedure: RIGHT INTERNAL JUGULAR INSERTION OF TUNNELED DIALYSIS CATHETER;  Surgeon: Angelia Mould, MD;  Location: [place];  Service: Vascular;  Laterality: Right;   INTRAOPERATIVE ARTERIOGRAM Right 09/10/2017   Procedure: INTRA OPERATIVE ARTERIOGRAM;  Surgeon: Angelia Mould, MD;  Location: [place];  Service: Vascular;  Laterality: Right;   IR ANGIOGRAM FOLLOW UP STUDY  09/09/2017   IR FLUORO GUIDE CV LINE RIGHT  05/31/2020   IR US GUIDE VASC ACCESS RIGHT  06/01/2020   LEFT HEART CATH AND CORONARY ANGIOGRAPHY N/A 08/19/2020   Procedure: LEFT HEART CATH AND CORONARY ANGIOGRAPHY;  Surgeon: Adrian Prows, MD;  Location: [place] [facility];  Service: Cardiovascular;  Laterality: N/A;   LEFT HEART CATHETERIZATION WITH CORONARY ANGIOGRAM N/A 11/09/2014   Procedure: LEFT HEART CATHETERIZATION WITH CORONARY ANGIOGRAM;  Surgeon: Laverda Page, MD;  Location: [facility] CATH LAB;  Service: Cardiovascular;  Laterality: N/A;   LOWER EXTREMITY ANGIOGRAM Right 11/08/2015   Procedure: Lower Extremity Angiogram;  Surgeon: Serafina Mitchell, MD;  Location: [place] [facility];  Service: Cardiovascular;  Laterality: Right;   LUMBAR LAMINECTOMY  06/13/2012   Procedure: MICRODISCECTOMY LUMBAR LAMINECTOMY;  Surgeon: Jessy Oto, MD;  Location: [place];  Service: Orthopedics;  Laterality: N/A;  Central laminectomy L2-3, L3-4, L4-5   PACEMAKER INSERTION  10/08/2014   MDT Advisa MRI compatible dual chamber pacemaker implanted by Dr Caryl Comes for syncope with post-termination pauses   PERCUTANEOUS CORONARY STENT INTERVENTION (PCI-S)  11/09/2014   des to lad & distal circumflex         Dr  [name]   PERIPHERAL VASCULAR BALLOON ANGIOPLASTY Right 11/11/2019   Procedure: PERIPHERAL VASCULAR BALLOON ANGIOPLASTY;  Surgeon: Marty Heck, MD;  Location: [place] [facility];  Service:  Cardiovascular;  Laterality: Right;  below knee popliteal, tibioperoneal trunk, posterior tibial arteries   PERIPHERAL VASCULAR CATHETERIZATION N/A 11/08/2015   Procedure: Abdominal Aortogram;  Surgeon: Serafina Mitchell, MD;  Location: [place] [facility];  Service: Cardiovascular;  Laterality: N/A;   PERIPHERAL VASCULAR CATHETERIZATION Right 11/08/2015   Procedure: Peripheral Vascular Atherectomy;  Surgeon: Serafina Mitchell, MD;  Location: [place] [facility];  Service: Cardiovascular;  Laterality: Right;   PERIPHERAL VASCULAR CATHETERIZATION N/A 10/10/2016   Procedure: Lower Extremity Angiography;  Surgeon: Waynetta Sandy, MD;  Location: [place] [facility];  Service: Cardiovascular;  Laterality: N/A;   PERIPHERAL VASCULAR CATHETERIZATION Right 10/10/2016   Procedure: Peripheral Vascular Atherectomy;  Surgeon: Waynetta Sandy, MD;  Location: [place] [facility];  Service: Cardiovascular;  Laterality: Right;  Popliteal   PERMANENT PACEMAKER INSERTION N/A 10/08/2014   Procedure: PERMANENT PACEMAKER INSERTION;  Surgeon: Deboraha Sprang, MD;  Location: [facility] CATH LAB;  Service: Cardiovascular;  Laterality: N/A;   SUBMUCOSAL TATTOO INJECTION  07/20/2020  Procedure: SUBMUCOSAL TATTOO INJECTION;  Surgeon: Irving Copas., MD;  Location: [place];  Service: Gastroenterology;;   TEE WITHOUT CARDIOVERSION N/A 10/27/2021   Procedure: TRANSESOPHAGEAL ECHOCARDIOGRAM (TEE);  Surgeon: Adrian Prows, MD;  Location: [facility] ENDOSCOPY;  Service: Cardiovascular;  Laterality: N/A;   TEMPORARY PACEMAKER INSERTION Bilateral 10/07/2014   Procedure: TEMPORARY PACEMAKER INSERTION;  Surgeon: Laverda Page, MD;  Location: [facility] CATH LAB;  Service: Cardiovascular;  Laterality: Bilateral;   WOUND DEBRIDEMENT Right 02/19/2020   Procedure: DEBRIDEMENT WOUND RIGHT FOOT;  Surgeon: Angelia Mould, MD;  Location: [place];  Service: Vascular;  Laterality: Right;    Allergies: Cytoxan [cyclophosphamide],  Pletal [cilostazol], Keflex [cephalexin], and Tylenol [acetaminophen]  Medications: Prior to Admission medications   Medication Sig Start Date End Date Taking? Authorizing Provider  amoxicillin (AMOXIL) 500 MG capsule Take 1 capsule (500 mg total) by mouth at bedtime. 11/22/21 11/17/22 Yes Vu, Rockey Situ, MD  apixaban (ELIQUIS) 5 MG TABS tablet Take 1 tablet (5 mg total) by mouth 2 (two) times daily. 12/11/21 03/11/22 Yes Cantwell, Celeste C, PA-C  atorvastatin (LIPITOR) 20 MG tablet Take 1 tablet (20 mg total) by mouth at bedtime. 10/01/21  Yes Aline August, MD  doxycycline (VIBRA-TABS) 100 MG tablet Take 1 tablet (100 mg total) by mouth 2 (two) times daily. 11/22/21 11/17/22 Yes Vu, Rockey Situ, MD  furosemide (LASIX) 80 MG tablet Take 80 mg by mouth See admin instructions. Take 80 mg by mouth on (non-dialysis days) Mondays, Wednesdays, Fridays & Sundays in the morning as needed "to cause urination" 02/26/21  Yes [provider]  ibuprofen (ADVIL) 800 MG tablet Take 800 mg by mouth every 8 (eight) hours as needed for moderate pain.   Yes [provider]  lidocaine-prilocaine (EMLA) cream Apply 1 application topically every dialysis (on Tues/Thurs/Sat).   Yes [provider]  metoprolol tartrate (LOPRESSOR) 25 MG tablet Take 1 tablet (25 mg total) by mouth 2 (two) times daily. 05/24/21 05/19/22 Yes Adrian Prows, MD  Multiple Vitamin (MULTIVITAMIN WITH MINERALS) TABS tablet Take 1 tablet by mouth 2 (two) times a week.   Yes [provider]  NOVOLIN 70/30 (70-30) 100 UNIT/ML injection Inject 16 Units into the skin 2 (two) times daily with a meal. Patient taking differently: Inject 10-20 Units into the skin 2 (two) times daily as needed (high blood sugar). 11/06/21  Yes Patrecia Pour, MD  oxyCODONE-acetaminophen (PERCOCET/ROXICET) 5-325 MG tablet Take 1 tablet by mouth every 8 (eight) hours as needed for moderate pain or severe pain. 01/11/22  Yes Newt Minion, MD  pantoprazole  (PROTONIX) 40 MG tablet Take 40 mg by mouth daily.   Yes [provider]  ACCU-CHEK FASTCLIX LANCETS MISC Check blood sugar TID & QHS 10/01/14   Advani, Vernon Prey, MD  blood glucose meter kit and supplies KIT Check blood sugar TID & QHS 02/10/15   Advani, Vernon Prey, MD  Blood Glucose Monitoring Suppl (ACCU-CHEK ADVANTAGE DIABETES) kit Use as instructed 10/01/14   Lorayne Marek, MD  glucose blood (ACCU-CHEK AVIVA PLUS) test strip Use as instructed 10/01/14   Advani, Vernon Prey, MD  glucose blood (CHOICE DM FORA G20 TEST STRIPS) test strip Check blood sugar TID & QHS 02/10/15   Advani, Vernon Prey, MD  glucose blood test strip Use as instructed 11/20/13   Reyne Dumas, MD  methylPREDNISolone (MEDROL DOSEPAK) 4 MG TBPK tablet See admin instructions. 12/20/21   [provider]     Family History  Problem Relation Age of Onset   Diabetes  type II Mother    Hypertension Mother    Diabetes Mother    Liver cancer Father    Diabetes type II Sister    Breast cancer Sister    Diabetes Sister    Hypertension Sister    Diabetes type II Brother    Kidney failure Brother    Diabetes Brother    Hypertension Brother    Diabetes type II Sister     Social History   Socioeconomic History   Marital status: Married    Spouse name: Not on file   Number of children: 2   Years of education: Not on file   Highest education level: Not on file  Occupational History   Occupation: Disabled  Tobacco Use   Smoking status: Former    Packs/day: 1.50    Years: 30.00    Pack years: 45.00    Types: Cigarettes    Quit date: 10/07/2014    Years since quitting: 7.2   Smokeless tobacco: Never   Tobacco comments:    Does an occasional cigar  Vaping Use   Vaping Use: Never used  Substance and Sexual Activity   Alcohol use: Not Currently    Alcohol/week: 21.0 standard drinks    Types: 21 Glasses of wine per week    Comment: cut way back, none in the last month   Drug use: No   Sexual activity: Not on file   Other Topics Concern   Not on file  Social History Narrative   Not on file   Social Determinants of Health   Financial Resource Strain: Not on file  Food Insecurity: Not on file  Transportation Needs: Not on file  Physical Activity: Not on file  Stress: Not on file  Social Connections: Not on file     Review of Systems: A 12 point ROS discussed and pertinent positives are indicated in the HPI above.  All other systems are negative.  Vital Signs: BP 111/69    Pulse 67    Temp (!) 96.9 F (36.1 C) (Temporal)    Resp 18    Ht 6' (1.829 m)    Wt 211 lb 13.8 oz (96.1 kg)    SpO2 99%    BMI 28.73 kg/m    Physical Exam Vitals reviewed.  Constitutional:      General: He is not in acute distress.    Appearance: Normal appearance.  HENT:     Head: Normocephalic and atraumatic.     Mouth/Throat:     Mouth: Mucous membranes are moist.     Comments: Missing teeth Cardiovascular:     Rate and Rhythm: Regular rhythm. Tachycardia present.     Pulses: Normal pulses.     Heart sounds: Normal heart sounds.  Pulmonary:     Effort: Pulmonary effort is normal.     Breath sounds: Normal breath sounds.  Abdominal:     General: Abdomen is flat. Bowel sounds are normal.     Palpations: Abdomen is soft.  Musculoskeletal:     Cervical back: Neck supple.  Skin:    General: Skin is warm and dry.     Coloration: Skin is not jaundiced or pale.  Neurological:     Mental Status: He is alert and oriented to person, place, and time.  Psychiatric:        Mood and Affect: Mood normal.        Behavior: Behavior normal.        Judgment: Judgment normal.  MD Evaluation Airway: WNL Heart: WNL Abdomen: WNL Chest/ Lungs: WNL ASA  Classification: 3 Mallampati/Airway Score: Two  Imaging: CT Lumbar Spine Wo Contrast  Result Date: 01/13/2022 CLINICAL DATA:  Low back pain EXAM: CT LUMBAR SPINE WITHOUT CONTRAST TECHNIQUE: Multidetector CT imaging of the lumbar spine was performed without  intravenous contrast administration. Multiplanar CT image reconstructions were also generated. RADIATION DOSE REDUCTION: This exam was performed according to the departmental dose-optimization program which includes automated exposure control, adjustment of the mA and/or kV according to patient size and/or use of iterative reconstruction technique. COMPARISON:  10/28/2021 FINDINGS: Segmentation: 5 lumbar type vertebrae. Alignment: Normal. Vertebrae: There are bilateral laminectomies at L3 and L4. There is progressive increase in size of inferior L2 and superior L3 endplate deformities. Gas in the disc space argues against infection. No acute fracture. Paraspinal and other soft tissues: Calcific aortic atherosclerosis. Disc levels: T12-L1: Unremarkable. L1-2: No spinal canal stenosis. L2-3: Progression of endplate changes. Large disc bulge with osteophytes. Moderate spinal canal and bilateral neural foraminal stenosis. L3-4: Posterior decompression without spinal canal stenosis or neural impingement. L4-5: Small left subarticular disc protrusion without stenosis. L5-S1: Disc bulge and endplate spurring with moderate bilateral foraminal stenosis. No spinal canal stenosis. IMPRESSION: 1. Progressive increase in size of inferior L2 and superior L3 endplate deformities, likely degenerative. Gas in the disc space argues against infection. 2. Moderate spinal canal and bilateral neural foraminal stenosis at L2-3. 3. Moderate bilateral L5-S1 foraminal stenosis. Aortic Atherosclerosis (ICD10-I70.0). Electronically Signed   By: Ulyses Jarred M.D.   On: 01/13/2022 23:15   MR LUMBAR SPINE WO CONTRAST  Result Date: 01/15/2022 CLINICAL DATA:  Fall today with left lower extremity weakness. Known lumbar spine compression deformities. EXAM: MRI LUMBAR SPINE WITHOUT CONTRAST TECHNIQUE: Multiplanar, multisequence MR imaging of the lumbar spine was performed. No intravenous contrast was administered. COMPARISON:  CT lumbar spine  01/13/2022, 10/28/2021 and 09/16/2021. FINDINGS: Despite efforts by the technologist and patient, mild motion artifact is present on today's exam and could not be eliminated. This reduces exam sensitivity and specificity. Segmentation: Conventional anatomy assumed, with the last open disc space designated L5-S1.Concordant with previous imaging. Alignment:  Physiologic. Vertebrae: As demonstrated on recent CT, there are progressive endplate deformities involving the inferior endplate of L2 and superior endplate of L3 compared with previous CT of 09/16/2021. These deformities are associated with adjacent endplate sclerosis and no significant surrounding marrow edema. There is nonspecific T2 hyperintensity within the L2-3 disc. Endplate degenerative changes are also present at L4-5. Previous bilateral laminectomies at L3 and L4. The visualized sacroiliac joints appear unremarkable. Conus medullaris: Extends to the L2 level and appears normal. Paraspinal and other soft tissues: Mild generalized soft tissue edema and muscular atrophy. No focal paraspinal fluid collection identified. Disc levels: Sagittal images demonstrate mild disc degeneration and bulging at T11-12 without resulting spinal stenosis or nerve root encroachment. T12-L1: Normal interspace. L1-2: Mild disc bulging and bilateral facet hypertrophy. No significant spinal stenosis or nerve root encroachment. L2-3: As above, progressive endplate deformities with annular disc bulging and a right paracentral disc protrusion. Moderate facet and ligamentous hypertrophy. Resulting severe spinal stenosis with lateral recess and foraminal narrowing bilaterally. L3-4: Previous posterior decompression. Mild loss of disc height with annular disc bulging and mild facet hypertrophy. No significant residual spinal stenosis or nerve root encroachment. L4-5: Previous posterior decompression. Loss of disc height with annular disc bulging and a broad-based disc protrusion in the  left subarticular zone. Mild bilateral facet hypertrophy. Mild asymmetric left lateral  recess and left foraminal narrowing. L5-S1: Chronic degenerative disc disease with loss of disc height, annular disc bulging and endplate osteophytes. Chronic moderate foraminal narrowing bilaterally appears unchanged. IMPRESSION: 1. As seen on recent CT, there are progressive endplate deformities adjacent to the L2-3 disc with associated nonspecific discal hyperintensity. Based on previous studies, findings may reflect progressive subacute fractures with progressive endplate degeneration. Partially treated discitis/osteomyelitis is a consideration, although there is no significant bone marrow edema or paraspinal fluid collection. 2. Associated disc degeneration with annular disc bulging, facet and ligamentous hypertrophy contributing to severe multifactorial spinal stenosis and moderate narrowing of the lateral recesses and foramina bilaterally. 3. Previous posterior decompression at L3-4 and L4-5 without significant recurrent spinal stenosis. 4. Chronic moderate foraminal narrowing bilaterally at L5-S1 due to disc bulging and endplate osteophytes. Electronically Signed   By: Richardean Sale M.D.   On: 01/15/2022 14:17    Labs:  CBC: Recent Labs    01/13/22 1505 01/14/22 0415 01/15/22 0342 01/16/22 0333  WBC 9.2 12.8* 9.9 7.5  HGB 10.9* 11.0* 9.5* 9.8*  HCT 36.0* 35.2* 30.7* 30.8*  PLT PLATELET CLUMPS NOTED ON SMEAR, UNABLE TO ESTIMATE 104* 99* 92*    COAGS: Recent Labs    10/30/21 1629 11/01/21 1810 11/02/21 0356  INR 3.0*  --   --   APTT  --  41* 70*    BMP: Recent Labs    01/13/22 1505 01/14/22 0415 01/15/22 0342 01/16/22 0333  NA 135 135 133* 130*  K 3.7 3.9 4.1 4.3  CL 96* 96* 97* 95*  CO2 '28 26 25 24  ' GLUCOSE 157* 153* 145* 181*  BUN 14 17 24* 34*  CALCIUM 8.4* 8.4* 8.4* 8.5*  CREATININE 2.03* 2.58* 3.30* 3.99*  GFRNONAA 36* 27* 20* 16*    LIVER FUNCTION TESTS: Recent Labs     10/31/21 0427 11/02/21 0356 11/06/21 0754 12/06/21 0840 12/20/21 0927 01/13/22 1505  BILITOT 1.9*  --   --  1.0 1.1 0.9  AST 42*  --   --  11* 13* 25  ALT 87*  --   --  '13 10 19  ' ALKPHOS 65  --   --  84 99 91  PROT 5.3*  --   --  6.3* 6.1* 5.5*  ALBUMIN 1.6*   < > 1.8* 3.0* 2.9* 2.4*   < > = values in this interval not displayed.    TUMOR MARKERS: No results for input(s): AFPTM, CEA, CA199, CHROMGRNA in the last 8760 hours.  Assessment and Plan: 61 y.o. male with back pain, MRI on 3/6/ raised concern for progressive subacute fractures with progressive endplate degeneration VS partially treated discitis/osteomyelitis. Neurosurgery following, recommended  L2-3 disc aspiration prior to anticipated back surgery.   NIR was requested for L2-L3 disc aspiration.  Case was reviewed and approved by Dr. Katherina Right Radrigues.  Patient is in Eliquis, last give on 01/15/22.   Risks and benefits of L2-3 disc aspiration was discussed with the patient and/or patient's family including, but not limited to bleeding, infection, damage to adjacent structures or low yield requiring additional tests.  All of the questions were answered and there is agreement to proceed.  Consent signed and in IR.    The procedure is tentatively scheduled for Thursday 01/18/22.    PLAN  - NPO Thursday MN  - Continue to hold Eliquis until aspiration is done - No INR obtained since December 2022, will update    Thank you for this interesting consult.  I greatly  enjoyed meeting Alexander [name] and look forward to participating in their care.  A copy of this report was sent to the requesting provider on this date.  Electronically Signed: Tera Mater, PA-C 01/16/2022, 3:50 PM   I spent a total of 20 Minutes    in face to face in clinical consultation, greater than 50% of which was counseling/coordinating care for L2-3 disc aspiration.   This chart was dictated using voice recognition software.  Despite best efforts to  proofread,  errors can occur which can change the documentation meaning.

## 2022-01-16 NOTE — Progress Notes (Signed)
PT Cancellation Note ? ?Patient Details ?Name: Alexander [name] ?MRN: [id number] ?DOB: [birth year] ? ? ?Cancelled Treatment:    Reason Eval/Treat Not Completed: Patient at procedure or test/unavailable ? ?Currently in HD; ? ?Will follow up later today as time allows;  ?Otherwise, will follow up for PT tomorrow;  ? ?Thank you,  ?Roney Marion, PT  ?Acute Rehabilitation Services ?Pager [phone] ?Office [phone]  ? ? ?[place] ?01/16/2022, 2:01 PM ?

## 2022-01-16 NOTE — Progress Notes (Signed)
?[facility] ?Progress Note  ? ?Subjective:    ?Seen and examined patient on HD. No complaints.. Denies SOB and CP. So far, tolerating UFG 2.5L.  ? ?Objective ?Vitals:  ? 01/16/22 1300 01/16/22 1330 01/16/22 1400 01/16/22 1430  ?BP: 109/71 113/60 108/78 134/64  ?Pulse: 80 94 (!) 102 (!) 101  ?Resp:      ?Temp:      ?TempSrc:      ?SpO2:      ?Weight:      ?Height:      ? ?Physical Exam ?General: Lying in bed on HD; NAD ?Lungs: Clear anteriorly; No wheeze, rales or rhonchi. Breathing is unlabored. ?Heart: RRR. No murmur, rubs or gallops.  ?Abdomen: soft, nontender, +BS ?Lower extremities: R BKA; No edema LLE ?Neuro: AAOx3. Moves all extremities spontaneously. ?Dialysis Access: R AVF (+) B/T ? ?Filed Weights  ? 01/13/22 2134 01/14/22 0317 01/16/22 1220  ?Weight: 107.5 kg 93.4 kg 96.1 kg  ? ? ?Intake/Output Summary (Last 24 hours) at 01/16/2022 1441 ?Last data filed at 01/16/2022 6063 ?Gross per 24 hour  ?Intake 200 ml  ?Output 300 ml  ?Net -100 ml  ? ? ?Additional Objective ?Labs: ?Basic Metabolic Panel: ?Recent Labs  ?Lab 01/14/22 ?0415 01/15/22 ?0342 01/16/22 ?0333  ?NA 135 133* 130*  ?K 3.9 4.1 4.3  ?CL 96* 97* 95*  ?CO2 '26 25 24  '$ ?GLUCOSE 153* 145* 181*  ?BUN 17 24* 34*  ?CREATININE 2.58* 3.30* 3.99*  ?CALCIUM 8.4* 8.4* 8.5*  ?PHOS  --   --  4.9*  ? ?Liver Function Tests: ?Recent Labs  ?Lab 01/13/22 ?1505  ?AST 25  ?ALT 19  ?ALKPHOS 91  ?BILITOT 0.9  ?PROT 5.5*  ?ALBUMIN 2.4*  ? ?No results for input(s): LIPASE, AMYLASE in the last 168 hours. ?CBC: ?Recent Labs  ?Lab 01/13/22 ?1505 01/14/22 ?0160 01/15/22 ?1093 01/16/22 ?2355  ?WBC 9.2 12.8* 9.9 7.5  ?NEUTROABS 6.7  --  7.0 6.1  ?HGB 10.9* 11.0* 9.5* 9.8*  ?HCT 36.0* 35.2* 30.7* 30.8*  ?MCV 100.6* 100.0 99.4 97.8  ?PLT PLATELET CLUMPS NOTED ON SMEAR, UNABLE TO ESTIMATE 104* 99* 92*  ? ?Blood Culture ?   ?Component Value Date/Time  ? SDES BLOOD LEFT FOREARM 01/14/2022 1533  ? SPECREQUEST  01/14/2022 1533  ?  BOTTLES DRAWN AEROBIC AND ANAEROBIC Blood  Culture adequate volume  ? CULT  01/14/2022 1533  ?  NO GROWTH 2 DAYS ?Performed at [facility], [place] [address] [place], [place] [address] ?  ? REPTSTATUS PENDING 01/14/2022 1533  ? ? ?Cardiac Enzymes: ?No results for input(s): CKTOTAL, CKMB, CKMBINDEX, TROPONINI in the last 168 hours. ?CBG: ?Recent Labs  ?Lab 01/15/22 ?1138 01/15/22 ?1703 01/15/22 ?2032 01/16/22 ?2542 01/16/22 ?1158  ?GLUCAP 137* 135* 119* 278* 258*  ? ?Iron Studies: No results for input(s): IRON, TIBC, TRANSFERRIN, FERRITIN in the last 72 hours. ?Lab Results  ?Component Value Date  ? INR 3.0 (H) 10/30/2021  ? INR 1.2 07/22/2020  ? INR 1.2 05/26/2020  ? ?Studies/Results: ?MR LUMBAR SPINE WO CONTRAST ? ?Result Date: 01/15/2022 ?CLINICAL DATA:  Fall today with left lower extremity weakness. Known lumbar spine compression deformities. EXAM: MRI LUMBAR SPINE WITHOUT CONTRAST TECHNIQUE: Multiplanar, multisequence MR imaging of the lumbar spine was performed. No intravenous contrast was administered. COMPARISON:  CT lumbar spine 01/13/2022, 10/28/2021 and 09/16/2021. FINDINGS: Despite efforts by the technologist and patient, mild motion artifact is present on today's exam and could not be eliminated. This reduces exam sensitivity and specificity. Segmentation:  Conventional anatomy assumed, with the last open disc space designated L5-S1.Concordant with previous imaging. Alignment:  Physiologic. Vertebrae: As demonstrated on recent CT, there are progressive endplate deformities involving the inferior endplate of L2 and superior endplate of L3 compared with previous CT of 09/16/2021. These deformities are associated with adjacent endplate sclerosis and no significant surrounding marrow edema. There is nonspecific T2 hyperintensity within the L2-3 disc. Endplate degenerative changes are also present at L4-5. Previous bilateral laminectomies at L3 and L4. The visualized sacroiliac joints appear unremarkable. Conus medullaris: Extends to the L2 level and  appears normal. Paraspinal and other soft tissues: Mild generalized soft tissue edema and muscular atrophy. No focal paraspinal fluid collection identified. Disc levels: Sagittal images demonstrate mild disc degeneration and bulging at T11-12 without resulting spinal stenosis or nerve root encroachment. T12-L1: Normal interspace. L1-2: Mild disc bulging and bilateral facet hypertrophy. No significant spinal stenosis or nerve root encroachment. L2-3: As above, progressive endplate deformities with annular disc bulging and a right paracentral disc protrusion. Moderate facet and ligamentous hypertrophy. Resulting severe spinal stenosis with lateral recess and foraminal narrowing bilaterally. L3-4: Previous posterior decompression. Mild loss of disc height with annular disc bulging and mild facet hypertrophy. No significant residual spinal stenosis or nerve root encroachment. L4-5: Previous posterior decompression. Loss of disc height with annular disc bulging and a broad-based disc protrusion in the left subarticular zone. Mild bilateral facet hypertrophy. Mild asymmetric left lateral recess and left foraminal narrowing. L5-S1: Chronic degenerative disc disease with loss of disc height, annular disc bulging and endplate osteophytes. Chronic moderate foraminal narrowing bilaterally appears unchanged. IMPRESSION: 1. As seen on recent CT, there are progressive endplate deformities adjacent to the L2-3 disc with associated nonspecific discal hyperintensity. Based on previous studies, findings may reflect progressive subacute fractures with progressive endplate degeneration. Partially treated discitis/osteomyelitis is a consideration, although there is no significant bone marrow edema or paraspinal fluid collection. 2. Associated disc degeneration with annular disc bulging, facet and ligamentous hypertrophy contributing to severe multifactorial spinal stenosis and moderate narrowing of the lateral recesses and foramina  bilaterally. 3. Previous posterior decompression at L3-4 and L4-5 without significant recurrent spinal stenosis. 4. Chronic moderate foraminal narrowing bilaterally at L5-S1 due to disc bulging and endplate osteophytes. Electronically Signed   By: Richardean Sale M.D.   On: 01/15/2022 14:17   ? ?Medications: ? sodium chloride    ? sodium chloride    ? ? atorvastatin  20 mg Oral QHS  ? calcitRIOL  0.25 mcg Oral Daily  ? Chlorhexidine Gluconate Cloth  6 each Topical Daily  ? dexamethasone (DECADRON) injection  10 mg Intravenous Q6H  ? furosemide  80 mg Oral Once per day on Sun Mon Wed Fri  ? insulin aspart  0-6 Units Subcutaneous TID WC  ? insulin aspart protamine- aspart  16 Units Subcutaneous BID WC  ? lidocaine  1 patch Transdermal Q24H  ? metoprolol tartrate  25 mg Oral BID  ? multivitamin with minerals  1 tablet Oral Once per day on Mon Thu  ? pantoprazole  40 mg Oral Daily  ? ? ?Dialysis Orders: ?TTS - GOC ?4hrs41mn, BFR 400, DFR 800,  EDW 94kg, 3K/ 2.5Ca ?No Systemic Heparin ?Mircera 200 mcg q2wks - last 01/11/22 ?Calcitriol 0.255m PO qHD - last 01/13/22 ? ?Assessment/Plan: ?1 Lumbar back pain/Radiculopathy: Followed by Spine Surgery Dr. CrSaintclair HalstedMR Lumbar Spine completed: showed fractures L2-L3 with fluid-? Trauma vs. Infection. Reviewed Dr. CrWindy Carinaast note: recommend surgical decompression. Continue pain management and Pt/OT ?  2 ESRD - on HD TTS. On HD. ?3  Hypertension/volume  - Currently not volume overloaded. Bps stable ?4. Anemia of CKD - Hgb 9.8. ESA not due yet ?5. Secondary Hyperparathyroidism - PO4 at goal. Continue VDRA. ?6. Nutrition - Renal diet on fluid restriction ? ?Tobie Poet, NP ?[facility] ?01/16/2022,2:41 PM ? LOS: 3 days  ?  ?

## 2022-01-17 DIAGNOSIS — M5416 Radiculopathy, lumbar region: Secondary | ICD-10-CM | POA: Diagnosis not present

## 2022-01-17 DIAGNOSIS — Z992 Dependence on renal dialysis: Secondary | ICD-10-CM

## 2022-01-17 DIAGNOSIS — N186 End stage renal disease: Secondary | ICD-10-CM | POA: Diagnosis not present

## 2022-01-17 LAB — SEDIMENTATION RATE
Sed Rate: 25 mm/hr — ABNORMAL HIGH (ref 0–16)
Sed Rate: 25 mm/hr — ABNORMAL HIGH (ref 0–16)

## 2022-01-17 LAB — CBC WITH DIFFERENTIAL/PLATELET
Abs Immature Granulocytes: 0.07 10*3/uL (ref 0.00–0.07)
Basophils Absolute: 0 10*3/uL (ref 0.0–0.1)
Basophils Relative: 0 %
Eosinophils Absolute: 0 10*3/uL (ref 0.0–0.5)
Eosinophils Relative: 0 %
HCT: 30.4 % — ABNORMAL LOW (ref 39.0–52.0)
Hemoglobin: 9.5 g/dL — ABNORMAL LOW (ref 13.0–17.0)
Immature Granulocytes: 1 %
Lymphocytes Relative: 15 %
Lymphs Abs: 1.3 10*3/uL (ref 0.7–4.0)
MCH: 30.6 pg (ref 26.0–34.0)
MCHC: 31.3 g/dL (ref 30.0–36.0)
MCV: 98.1 fL (ref 80.0–100.0)
Monocytes Absolute: 0.4 10*3/uL (ref 0.1–1.0)
Monocytes Relative: 5 %
Neutro Abs: 7.4 10*3/uL (ref 1.7–7.7)
Neutrophils Relative %: 79 %
Platelets: 109 10*3/uL — ABNORMAL LOW (ref 150–400)
RBC: 3.1 MIL/uL — ABNORMAL LOW (ref 4.22–5.81)
RDW: 18.6 % — ABNORMAL HIGH (ref 11.5–15.5)
WBC: 9.2 10*3/uL (ref 4.0–10.5)
nRBC: 0.8 % — ABNORMAL HIGH (ref 0.0–0.2)

## 2022-01-17 LAB — GLUCOSE, CAPILLARY
Glucose-Capillary: 253 mg/dL — ABNORMAL HIGH (ref 70–99)
Glucose-Capillary: 215 mg/dL — ABNORMAL HIGH (ref 70–99)
Glucose-Capillary: 166 mg/dL — ABNORMAL HIGH (ref 70–99)
Glucose-Capillary: 200 mg/dL — ABNORMAL HIGH (ref 70–99)

## 2022-01-17 LAB — BASIC METABOLIC PANEL
Anion gap: 10 (ref 5–15)
BUN: 27 mg/dL — ABNORMAL HIGH (ref 8–23)
CO2: 25 mmol/L (ref 22–32)
Calcium: 8.3 mg/dL — ABNORMAL LOW (ref 8.9–10.3)
Chloride: 98 mmol/L (ref 98–111)
Creatinine, Ser: 2.92 mg/dL — ABNORMAL HIGH (ref 0.61–1.24)
GFR, Estimated: 23 mL/min — ABNORMAL LOW (ref >60)
Glucose, Bld: 241 mg/dL — ABNORMAL HIGH (ref 70–99)
Potassium: 3.9 mmol/L (ref 3.5–5.1)
Sodium: 133 mmol/L — ABNORMAL LOW (ref 135–145)

## 2022-01-17 LAB — PROTIME-INR
INR: 1.9 — ABNORMAL HIGH (ref 0.8–1.2)
Prothrombin Time: 21.8 seconds — ABNORMAL HIGH (ref 11.4–15.2)

## 2022-01-17 LAB — HEPATITIS B SURFACE ANTIBODY, QUANTITATIVE: Hep B S AB Quant (Post): 22.5 m[IU]/mL (ref >9.9)

## 2022-01-17 MED ORDER — ALTEPLASE 2 MG IJ SOLR: 2.0000 mg | Freq: Once | INTRAMUSCULAR | Status: DC | PRN

## 2022-01-17 MED ORDER — HEPARIN SODIUM (PORCINE) 1000 UNIT/ML DIALYSIS: 1000.0000 [IU] | INTRAMUSCULAR | Status: DC | PRN

## 2022-01-17 MED ORDER — LIDOCAINE HCL (PF) 1 % IJ SOLN: 5.0000 mL | INTRAMUSCULAR | Status: DC | PRN

## 2022-01-17 MED ORDER — SODIUM CHLORIDE 0.9 % IV SOLN: 100.0000 mL | INTRAVENOUS | Status: DC | PRN

## 2022-01-17 MED ORDER — METHOCARBAMOL 500 MG PO TABS
250.0000 mg | ORAL_TABLET | Freq: Three times a day (TID) | ORAL | Status: DC | PRN
  Administered 2022-01-17 – 2022-01-21 (×10): 250 mg via ORAL
  Filled 2022-01-17 (×10): qty 1

## 2022-01-17 MED ORDER — PENTAFLUOROPROP-TETRAFLUOROETH EX AERO: 1.0000 "application " | INHALATION_SPRAY | CUTANEOUS | Status: DC | PRN

## 2022-01-17 NOTE — Assessment & Plan Note (Signed)
Pressure Injury 01/14/22 Ischial tuberosity Right Stage 3 -  Full thickness tissue loss. Subcutaneous fat may be visible but bone, tendon or muscle are NOT exposed. (Active)  ?01/14/22 0321  ?Location: Ischial tuberosity  ?Location Orientation: Right  ?Staging: Stage 3 -  Full thickness tissue loss. Subcutaneous fat may be visible but bone, tendon or muscle are NOT exposed.  ?Wound Description (Comments):   ?Present on Admission: Yes  ?   ?Pressure Injury 01/14/22 Buttocks Right Stage 2 -  Partial thickness loss of dermis presenting as a shallow open injury with a red, pink wound bed without slough. (Active)  ?01/14/22 0322  ?Location: Buttocks  ?Location Orientation: Right  ?Staging: Stage 2 -  Partial thickness loss of dermis presenting as a shallow open injury with a red, pink wound bed without slough.  ?Wound Description (Comments):   ?Present on Admission: Yes  ?   ?Pressure Injury 01/14/22 Sacrum Mid Stage 2 -  Partial thickness loss of dermis presenting as a shallow open injury with a red, pink wound bed without slough. (Active)  ?01/14/22 0322  ?Location: Sacrum  ?Location Orientation: Mid  ?Staging: Stage 2 -  Partial thickness loss of dermis presenting as a shallow open injury with a red, pink wound bed without slough.  ?Wound Description (Comments):   ?Present on Admission: Yes  ?   ?Pressure Injury 01/14/22 Buttocks Left Stage 2 -  Partial thickness loss of dermis presenting as a shallow open injury with a red, pink wound bed without slough. (Active)  ?01/14/22 0323  ?Location: Buttocks  ?Location Orientation: Left  ?Staging: Stage 2 -  Partial thickness loss of dermis presenting as a shallow open injury with a red, pink wound bed without slough.  ?Wound Description (Comments):   ?Present on Admission: Yes  ?   ?Pressure Injury 01/14/22 Buttocks Posterior;Proximal;Right Stage 2 -  Partial thickness loss of dermis presenting as a shallow open injury with a red, pink wound bed without slough. (Active)   ?01/14/22 0324  ?Location: Buttocks  ?Location Orientation: Posterior;Proximal;Right  ?Staging: Stage 2 -  Partial thickness loss of dermis presenting as a shallow open injury with a red, pink wound bed without slough.  ?Wound Description (Comments):   ?Present on Admission: Yes  ? ? ? ? ?

## 2022-01-17 NOTE — Progress Notes (Signed)
PT Cancellation Note ? ?Patient Details ?Name: Alexander [name] ?MRN: [id number] ?DOB: Oct 31, 1961 ? ? ?Cancelled Treatment:    Reason Eval/Treat Not Completed: Patient declined, no reason specified Patient declined OOB mobility and stated "I'm not going to get up. I'm having surgery soon on my back." Educated patient on importance of mobility after surgery. Patient stated "we'll see what the doctor says about that." PT will re-attempt at later date.  ? ?Nesa Distel A. Gilford Rile, PT, DPT ?Acute Rehabilitation Services ?Pager [phone] ?Office [phone] ? ? ? ?Samuell Knoble A Delaney Perona ?01/17/2022, 1:22 PM ?

## 2022-01-17 NOTE — Progress Notes (Signed)
Interventional Radiology Brief Note: ? ?Patient INR remains 1.9.   ?Reviewed with Dr. Karenann Cai. Continue to hold Eliquis.   ?Will repeat INR in the AM and make NPO.  ? ?Brynda Greathouse, MS RD PA-C ? ? ?

## 2022-01-17 NOTE — Assessment & Plan Note (Signed)
At outpatient visit on 01/10/2022 with orthopedics, noted BLE with significant weeping, edema with induration, both noted with distal maceration with L LE noted open ulceration ?Concern for ischemic changes, placed in Unna and Dynaflex compression wraps bilaterally ?Consulted [name], we will continue with serial compression wraps and outpatient follow-up ?

## 2022-01-17 NOTE — Progress Notes (Signed)
?Progress Note ? ? ?Patient: Alexander [name] DOB: 1961-09-27 DOA: 01/13/2022     4 ?DOS: the patient was seen and examined on 01/17/2022 ?  ?Brief hospital course: ?Alexander [name] is a 61 y.o. male with medical history significant for ESRD on HD on TTS, HTN, HLD, PVD status post right BKA and left midfoot amputation, Afib on Eliquis, CAD, DM2, and status post pacemaker, presented to the ER with worsening intractable low back pain over the last couple of days with associated left lower extremity radiation and radiculopathy with associated spasm and weakness, denies any urinary or stool incontinence. He has been having chronic low back pain with occasional radiation to both lower extremities. Pt has been followed by spine surgery and was scheduled for LS spine MRI on 02/07/2022.  Plan is for disc aspiration and if no infection then patient will have surgical decompression.   ? ? ?Assessment and Plan: ?* Lumbar back pain with radiculopathy affecting left lower extremity ?CRP, ESR elevated ?CT noted moderate neuroforaminal stenosis at L2-L3 and L5-S1 ?MRI lumbar spine showed subacute fractures L2-L3 disc, partially treated discitis/osteomyelitis is a consideration, severe multifactorial spinal stenosis at L2-3 ?Followed by spine surgery Dr Saintclair Halsted, rec disc aspiration by IR to rule out any discitis and plan for surgical decompression ?IR consulted for disc aspiration to rule out any infection, likely on 01/18/2022 ?Completed IV decadron ?Pain management as able ?PT/OT- recommending SNF ? ?ESRD (end stage renal disease) ([place]) ?HD- T/T/S ?Nephrology consulted ? ?PAF (paroxysmal atrial fibrillation) ([place]) ?Continue Lopressor and hold Eliquis due to upcoming procedure ? ?Essential hypertension, benign ?Continue Lopressor ? ?Dyslipidemia ?- We will continue statin therapy. ? ?Type 2 diabetes mellitus with chronic kidney disease, with long-term current use of insulin ([place]) ?Last A1c 6.8 ?SSI, NovoLog 70/30 twice daily,  hypoglycemic protocol, Accu-Cheks ? ?GERD without esophagitis ?Continue PPI therapy. ? ?Below-knee amputation of right lower extremity ([place]) ?At outpatient visit on 01/10/2022 with orthopedics, noted BLE with significant weeping, edema with induration, both noted with distal maceration with L LE noted open ulceration ?Concern for ischemic changes, placed in Unna and Dynaflex compression wraps bilaterally ?Consulted [name], we will continue with serial compression wraps and outpatient follow-up ? ?Pressure injury of skin ?Pressure Injury 01/14/22 Ischial tuberosity Right Stage 3 -  Full thickness tissue loss. Subcutaneous fat may be visible but bone, tendon or muscle are NOT exposed. (Active)  ?01/14/22 0321  ?Location: Ischial tuberosity  ?Location Orientation: Right  ?Staging: Stage 3 -  Full thickness tissue loss. Subcutaneous fat may be visible but bone, tendon or muscle are NOT exposed.  ?Wound Description (Comments):   ?Present on Admission: Yes  ?   ?Pressure Injury 01/14/22 Buttocks Right Stage 2 -  Partial thickness loss of dermis presenting as a shallow open injury with a red, pink wound bed without slough. (Active)  ?01/14/22 0322  ?Location: Buttocks  ?Location Orientation: Right  ?Staging: Stage 2 -  Partial thickness loss of dermis presenting as a shallow open injury with a red, pink wound bed without slough.  ?Wound Description (Comments):   ?Present on Admission: Yes  ?   ?Pressure Injury 01/14/22 Sacrum Mid Stage 2 -  Partial thickness loss of dermis presenting as a shallow open injury with a red, pink wound bed without slough. (Active)  ?01/14/22 0322  ?Location: Sacrum  ?Location Orientation: Mid  ?Staging: Stage 2 -  Partial thickness loss of dermis presenting as a shallow open injury with a red, pink wound bed  without slough.  ?Wound Description (Comments):   ?Present on Admission: Yes  ?   ?Pressure Injury 01/14/22 Buttocks Left Stage 2 -  Partial thickness loss of dermis presenting as a shallow  open injury with a red, pink wound bed without slough. (Active)  ?01/14/22 0323  ?Location: Buttocks  ?Location Orientation: Left  ?Staging: Stage 2 -  Partial thickness loss of dermis presenting as a shallow open injury with a red, pink wound bed without slough.  ?Wound Description (Comments):   ?Present on Admission: Yes  ?   ?Pressure Injury 01/14/22 Buttocks Posterior;Proximal;Right Stage 2 -  Partial thickness loss of dermis presenting as a shallow open injury with a red, pink wound bed without slough. (Active)  ?01/14/22 0324  ?Location: Buttocks  ?Location Orientation: Posterior;Proximal;Right  ?Staging: Stage 2 -  Partial thickness loss of dermis presenting as a shallow open injury with a red, pink wound bed without slough.  ?Wound Description (Comments):   ?Present on Admission: Yes  ? ? ? ? ? ?Pacemaker infection ([place]) ?Follows with ID outpatient ?-abx not resumed yet by prior MD- will consult ID to help with plan ?-last progress note: Plan to transition to doxy/amoxicillin 100 mg po bid and 500 mg po qhs -- indefinitely ? ? ?  ?Subjective: c/o spasm when he moves ? ?Physical Exam: ?Vitals:  ? 01/16/22 1558 01/16/22 1805 01/16/22 2054 01/17/22 0411  ?BP: 123/76 116/68 111/67 109/80  ?Pulse: 99 86 90 84  ?Resp: _0 ?Temp: 97.8 ?F (36.6 ?C) 98 ?F (36.7 ?C) 97.6 ?F (36.4 ?C) 97.7 ?F (36.5 ?C)  ?TempSrc: Temporal Oral    ?SpO2: 96% 99% 98% 97%  ?Weight: 94 kg     ?Height:      ? ? ?General: Appearance:     ?Overweight male in no acute distress  ?   ?Lungs:     respirations unlabored  ?Heart:    Normal heart rate.   ?MS:   Below knee amputation of right lower extremity is noted. Amputation of left foot is noted. Left  amputation noted. Right  amputation noted.   ?Neurologic:   Awake, alert  ?  ?Data Reviewed: ?Na slightly low, Cr elevated ? ? ?Disposition: ?Status is: Inpatient ?Remains inpatient appropriate because: needs disc aspiration ? Planned Discharge Destination: Skilled nursing  facility ? ?Time spent: 65 minutes ? ?Author: ?Geradine Girt, DO ?01/17/2022 10:03 AM ? ?For on call review [email or web address].  ?

## 2022-01-17 NOTE — Hospital Course (Addendum)
Alexander [name] is a 61 y.o. male with medical history significant for ESRD on HD on TTS, HTN, HLD, PVD status post right BKA and left midfoot amputation, Afib on Eliquis, CAD, DM2, and status post pacemaker, presented to the ER with worsening intractable low back pain over the last couple of days with associated left lower extremity radiation and radiculopathy with associated spasm and weakness, denies any urinary or stool incontinence. He has been having chronic low back pain with occasional radiation to both lower extremities. Pt has been followed by spine surgery and was scheduled for LS spine MRI on 02/07/2022.  Plan is for disc aspiration and if no infection then patient will have surgical decompression.  Aspiration done 3/10.  Surgery was planned for Monday but decision was made on Monday AM not to pursue further due to risk and chance surgery may not fix.   Appreciate Dr. Irven Shelling help with [place].

## 2022-01-17 NOTE — Assessment & Plan Note (Addendum)
Follows with ID outpatient -appreciated ID consult: Surgery had decided against intervention -- I agree as he is high risk for bsi and seeding of any foreign body given his chronic ulcers in LE/poor dention/esrd status   -continue oral suppressive doxycycline and amoxicillin as above indefinitely -follow up in id clinic with me on 02/14/22 @ 245pm -id will sign off

## 2022-01-17 NOTE — Consult Note (Signed)
[facility]                                                                                        Patient Identification: Patient Name: Alexander [name] MRN: [id number] [place] Date: 01/13/2022 12:41 PM Today's Date: 01/17/2022 Reason for consult: concern for discitis and osteomyelitis  Requesting provider: Rudean Curt   Principal Problem:   Lumbar back pain with radiculopathy affecting left lower extremity Active Problems:   Essential hypertension, benign   ESRD (end stage renal disease) ([place])   PAF (paroxysmal atrial fibrillation) ([place])   Pacemaker infection ([place])   Type 2 diabetes mellitus with chronic kidney disease, with long-term current use of insulin ([place])   GERD without esophagitis   Pressure injury of skin   Ulcer of right leg, limited to breakdown of skin ([place])   Below-knee amputation of right lower extremity ([place])   Antibiotics: None   Lines/Hardware: RT arm AVF   Assessment # Progressive subacute fractures with progressive end plate degeneration of lumbar vertebrae vs consideration of partially treated discitis and osteomyelitis   # TV endocarditis/PPM endocarditis - completed prolonged course of vancomycin and cefepime with HD until 12/08/21 and started on PO amoxicillin and Doxycyline indefinitely given high risk surgical candidate for device removal   # Ulceration at the RT TMA and Left TMA site - seen by Orthopedics, plan for serial compression wraps and OP fu   # ESRD on HD via Rt Arm AVF  Comments: Unlikely the CT/MRI findings to be infectious in nature given the clinical history as well as the imaging findings which are more supportive of degenerative changes. Patient has been afebrile, no leukocytosis, CRP unimpressive for discitis. Blood cultures are no growth in 3 days. Of note, he had recently completed 6 weeks course of vancomycin and cefepime with HD in Dec - Jan 2023  followed by PO doxycycline and amoxicillin. The treatment course for endocarditis  would have covered for discitis and osteomyelitis and very unlikely to develop a new bacterial process of spine while on abtx.    Recommendations  Agree with IR guided aspiration/biopsy for cultures and pathology as is being done Hold abtx for now pending IR intervention, can start PO doxycycline and PO amoxicillin post IR intervention for PPM endocarditis suppression  Fu IR aspirated cultures to make any changes on abtx Following   Rest of the management as per the primary team. Please call with questions or concerns.  Thank you for the consult  Rosiland Oz, MD Infectious Disease Physician [facility] [address] [place], [place] [address] Phone: [phone]   Fax: [phone]  __________________________________________________________________________________________________________ HPI and Hospital Course: 61 year old male with PMH of ESRD on HD, HTN, HLD, PVD s/p Rt SFA to Below knee bypass, Left femoral-tibial bypass graft /angioplasty, status post right BKA and left TMA, A-fib on anticoagulation, CAD, type II DM, s/p pacemaker and TV vegetation/endocarditis culture negative, Lumbar laminectomy in 2013, alcohol abuse who presented to the ED 3/4 with worsening lower back pain for few days prior to arrival with associated with LLE weakness.  He fell while transferring from his wheelchair into the car on day of arrival and family had to assist him getting up into the seat. He again slid off the board while exiting from the wheel chair van to the wheel chair <1 ft on the ground. He could not lift his left leg ( new) and hence, brought to the ED. He is having back pain since November and mostly sleeps in the wheel chair due to pain.  Denies fever or chills or night sweats.  Denies nausea, vomiting or abdominal pain. Good appetite. He has been taking  Doxycyline and amoxicillin as instructed, last dose on Friday 3/3. Also noted to have significant edema with weeping in both lower extremities which was managed with compression wraps and close follow-up.  Recently admitted in 10/2021 for culture negative TV/PPM endocarditis. Completed 6 weeks of vancomycin and cefepime with HD on 1/27 followed by PO doxy/amoxicillin for suppression indefinitely given high risk surgical candidate for device removal.   At ED presentation, afebrile with normal vital signs CT lumbar spine progressive increase in size of inferior L2 and superior L3 endplate deformities likely degenerative.  Gas in the disc space argues against infection.  Moderate spinal canal and bilateral neural foraminal stenosis at L2-L3.  Moderate bilateral L5-S1 foraminal stenosis.  MRI L-spine progressive endplate deformities adjacent to the L2-L3 disc with associated nonspecific disco hyperintensity.  Findings may reflect progressive subacute fractures with progressive endplate degeneration.  Partially treated discitis-osteomyelitis is a consideration although there is no significant bone marrow edema or paraspinal fluid collection.  Associated disc degeneration with annular disc bulging, facet and ligamentous hypertrophy contributing to severe multifactorial spinal stenosis and moderate narrowing of the lateral recesses and foramina bilaterally.   ROS: General- Denies fever, chills, loss of appetite and loss of weight HEENT - Denies headache, blurry vision, neck pain, sinus pain Chest - Denies any chest pain, SOB or cough CVS- Denies any dizziness/lightheadedness, syncopal attacks, palpitations Abdomen- Denies any nausea, vomiting, abdominal pain, hematochezia and diarrhea Neuro - Left leg weakness + Psych - Denies any changes in mood irritability or depressive symptoms GU- Denies any burning, dysuria, hematuria or increased frequency of urination MSK - lower back pain and radiculopathy  +  Past Medical History:  Diagnosis Date   Acute blood loss anemia    Alcohol abuse    Anemia    Anemia of chronic disease    Arthritis    "patient does not think so."   Atrial fibrillation ([place])    Cardiac syncope 10/07/2014   rapid A fib with 8 sec pauses on converison with syncope- temp pacing wire placed then PPM   Cataract    BILATERAL   Coronary artery disease    Diabetes mellitus    dx---been  awhile   Elevated LFTs    Endocarditis of tricuspid valve    ESRD (end stage renal disease) ([place])    [place]   GERD (gastroesophageal reflux disease)    History of blood transfusion    Hyperlipemia    Hypertension    Malnutrition ([place])    Osteomyelitis (HCC)    right foot   Peripheral vascular disease (HCC)    Presence of permanent cardiac pacemaker 10/08/2014   [employer]   Tricuspid valve vegetation    Past Surgical History:  Procedure Laterality Date   A/V FISTULAGRAM Right 05/01/2021   Procedure: A/V FISTULAGRAM;  Surgeon: Waynetta Sandy, MD;  Location: [place] [facility];  Service: Cardiovascular;  Laterality: Right;  failed fistulagram   ABDOMINAL AORTOGRAM W/LOWER EXTREMITY N/A 09/09/2017   Procedure: ABDOMINAL AORTOGRAM W/LOWER EXTREMITY;  Surgeon: Angelia Mould, MD;  Location: [place] [facility];  Service: Cardiovascular;  Laterality: N/A;   ABDOMINAL AORTOGRAM W/LOWER EXTREMITY Right 11/11/2019   Procedure: ABDOMINAL AORTOGRAM W/LOWER EXTREMITY;  Surgeon: Marty Heck, MD;  Location: [place] [facility];  Service: Cardiovascular;  Laterality: Right;   AMPUTATION Left 08/19/2013   Procedure: AMPUTATION RAY;  Surgeon: Alta Corning, MD;  Location: [place];  Service: Orthopedics;  Laterality: Left;  ray amputation left 5th   AMPUTATION Left 10/27/2013   Procedure: AMPUTATION DIGIT-LEFT 4TH TOE, 4th and 5th metatarsal.;  Surgeon: Angelia Mould, MD;  Location: [place];  Service: Vascular;  Laterality: Left;   AMPUTATION  Left 11/04/2013   Procedure: LEFT FOOT TRANS-METATARSAL AMPUTATION WITH WOUND CLOSURE ;  Surgeon: Alta Corning, MD;  Location: [place];  Service: Orthopedics;  Laterality: Left;   AMPUTATION Right 09/10/2017   Procedure: RIGHT FOURTH AND FIFTH TOE AMPUTATION;  Surgeon: Angelia Mould, MD;  Location: [place];  Service: Vascular;  Laterality: Right;   AMPUTATION Right 02/19/2020   Procedure: AMPUTATION RIGHT 3RD TOE;  Surgeon: Angelia Mould, MD;  Location: [place];  Service: Vascular;  Laterality: Right;   AMPUTATION Right 03/11/2020   Procedure: RIGHT BELOW KNEE AMPUTATION;  Surgeon: Newt Minion, MD;  Location: [place];  Service: Orthopedics;  Laterality: Right;   APPLICATION OF WOUND VAC Right 02/19/2020   Procedure: Application Of Wound Vac Right foot;  Surgeon: Angelia Mould, MD;  Location: [place];  Service: Vascular;  Laterality: Right;   AV FISTULA PLACEMENT Right 03/27/2021   Procedure: RIGHT ARM ARTERIOVENOUS (AV) FISTULA;  Surgeon: Angelia Mould, MD;  Location: [place];  Service: Vascular;  Laterality: Right;   AV FISTULA PLACEMENT Right 05/10/2021   Procedure: RIGHT ARM ARTERIOVENOUS (AV) FISTULA;  Surgeon: Waynetta Sandy, MD;  Location: [place];  Service: Vascular;  Laterality: Right;   [place] Right 08/11/2021   Procedure: RIGHT ARM SECOND STAGE BASILIC VEIN TRANSPOSITION;  Surgeon: Waynetta Sandy, MD;  Location: [place];  Service: Vascular;  Laterality: Right;   BIOPSY  07/20/2020   Procedure: BIOPSY;  Surgeon: Irving Copas., MD;  Location: [place];  Service: Gastroenterology;;   CARDIAC CATHETERIZATION  10/07/2014   Procedure: LEFT HEART CATH AND CORONARY ANGIOGRAPHY;  Surgeon: Laverda Page, MD;  Location: [facility] C[facility] LAB;  Service: Cardiovascular;;   COLONOSCOPY W/ POLYPECTOMY     COLONOSCOPY WITH PROPOFOL N/A 05/25/2020   Procedure: COLONOSCOPY WITH PROPOFOL;  Surgeon: Gatha Mayer, MD;  Location: [facility];  Service: Endoscopy;  Laterality: N/A;   EMBOLECTOMY Right 09/12/2017   Procedure: Thrombectomy  & Redo Right Below Knee Popliteal Artery Bypass Graft  ;  Surgeon: Waynetta Sandy, MD;  Location: [facility] OR;  Service: Vascular;  Laterality: Right;   ENTEROSCOPY N/A 07/20/2020   Procedure: ENTEROSCOPY;  Surgeon: Rush Landmark Telford Nab., MD;  Location: [place];  Service: Gastroenterology;  Laterality: N/A;   ESOPHAGOGASTRODUODENOSCOPY (EGD) WITH PROPOFOL N/A 05/24/2020   Procedure: ESOPHAGOGASTRODUODENOSCOPY (EGD) WITH PROPOFOL;  Surgeon: Ladene Artist, MD;  Location: [facility];  Service: Endoscopy;  Laterality: N/A;   EYE SURGERY Bilateral    cataract   FEMORAL-TIBIAL BYPASS GRAFT Left 10/27/2013   Procedure: BYPASS GRAFT LEFT FEMORAL- POSTERIOR TIBIAL ARTERY;  Surgeon: Angelia Mould, MD;  Location: [facility];  Service: Vascular;  Laterality: Left;   FEMORAL-TIBIAL BYPASS GRAFT Right 09/10/2017   Procedure: RIGHT SUPERFICIAL  FEMORAL ARTERY-BELOW KNEE POPLITEAL ARTERY BYPASS GRAFT WITH VEIN;  Surgeon: Angelia Mould, MD;  Location: [place];  Service: Vascular;  Laterality: Right;   GIVENS CAPSULE STUDY N/A 07/20/2020   Procedure: GIVENS CAPSULE STUDY;  Surgeon: Irving Copas., MD;  Location: [place];  Service: Gastroenterology;  Laterality: N/A;  capsule placed through scope into duodenum @ 0936   I & D EXTREMITY Left 10/27/2013   Procedure: IRRIGATION AND DEBRIDEMENT EXTREMITY- LEFT FOOT;  Surgeon: Angelia Mould, MD;  Location: [place];  Service: Vascular;  Laterality: Left;   I & D EXTREMITY Right 01/26/2020   Procedure: IRRIGATION AND DEBRIDEMENT EXTREMITY RIGHT FOOT WOUND;  Surgeon: Angelia Mould, MD;  Location: [employer];  Service: Vascular;  Laterality: Right;   INSERT / REPLACE / REMOVE PACEMAKER     INSERTION OF DIALYSIS CATHETER Right 11/03/2021   Procedure: RIGHT INTERNAL JUGULAR INSERTION OF TUNNELED  DIALYSIS CATHETER;  Surgeon: Angelia Mould, MD;  Location: [place];  Service: Vascular;  Laterality: Right;   INTRAOPERATIVE ARTERIOGRAM Right 09/10/2017   Procedure: INTRA OPERATIVE ARTERIOGRAM;  Surgeon: Angelia Mould, MD;  Location: [place];  Service: Vascular;  Laterality: Right;   IR ANGIOGRAM FOLLOW UP STUDY  09/09/2017   IR FLUORO GUIDE CV LINE RIGHT  05/31/2020   IR US GUIDE VASC ACCESS RIGHT  06/01/2020   LEFT HEART CATH AND CORONARY ANGIOGRAPHY N/A 08/19/2020   Procedure: LEFT HEART CATH AND CORONARY ANGIOGRAPHY;  Surgeon: Adrian Prows, MD;  Location: [place] [facility];  Service: Cardiovascular;  Laterality: N/A;   LEFT HEART CATHETERIZATION WITH CORONARY ANGIOGRAM N/A 11/09/2014   Procedure: LEFT HEART CATHETERIZATION WITH CORONARY ANGIOGRAM;  Surgeon: Laverda Page, MD;  Location: [facility] CATH LAB;  Service: Cardiovascular;  Laterality: N/A;   LOWER EXTREMITY ANGIOGRAM Right 11/08/2015   Procedure: Lower Extremity Angiogram;  Surgeon: Serafina Mitchell, MD;  Location: [place] [facility];  Service: Cardiovascular;  Laterality: Right;   LUMBAR LAMINECTOMY  06/13/2012   Procedure: MICRODISCECTOMY LUMBAR LAMINECTOMY;  Surgeon: Jessy Oto, MD;  Location: [place];  Service: Orthopedics;  Laterality: N/A;  Central laminectomy L2-3, L3-4, L4-5   PACEMAKER INSERTION  10/08/2014   MDT Advisa MRI compatible dual chamber pacemaker implanted by Dr Caryl Comes for syncope with post-termination pauses   PERCUTANEOUS CORONARY STENT INTERVENTION (PCI-S)  11/09/2014   des to lad & distal circumflex         Dr  [name]   PERIPHERAL VASCULAR BALLOON ANGIOPLASTY Right 11/11/2019   Procedure: PERIPHERAL VASCULAR BALLOON ANGIOPLASTY;  Surgeon: Marty Heck, MD;  Location: [place] [facility];  Service: Cardiovascular;  Laterality: Right;  below knee popliteal, tibioperoneal trunk, posterior tibial arteries   PERIPHERAL VASCULAR CATHETERIZATION N/A 11/08/2015   Procedure: Abdominal Aortogram;   Surgeon: Serafina Mitchell, MD;  Location: [place] [facility];  Service: Cardiovascular;  Laterality: N/A;   PERIPHERAL VASCULAR CATHETERIZATION Right 11/08/2015   Procedure: Peripheral Vascular Atherectomy;  Surgeon: Serafina Mitchell, MD;  Location: [place] [facility];  Service: Cardiovascular;  Laterality: Right;   PERIPHERAL VASCULAR CATHETERIZATION N/A 10/10/2016   Procedure: Lower Extremity Angiography;  Surgeon: Waynetta Sandy, MD;  Location: [place] [facility];  Service: Cardiovascular;  Laterality: N/A;   PERIPHERAL VASCULAR CATHETERIZATION Right 10/10/2016   Procedure: Peripheral Vascular Atherectomy;  Surgeon: Waynetta Sandy, MD;  Location:  [place] [facility];  Service: Cardiovascular;  Laterality: Right;  Popliteal   PERMANENT PACEMAKER INSERTION N/A 10/08/2014   Procedure: PERMANENT PACEMAKER INSERTION;  Surgeon: Deboraha Sprang, MD;  Location: [facility] CATH LAB;  Service: Cardiovascular;  Laterality: N/A;   SUBMUCOSAL TATTOO INJECTION  07/20/2020   Procedure: SUBMUCOSAL TATTOO INJECTION;  Surgeon: Irving Copas., MD;  Location: [place];  Service: Gastroenterology;;   TEE WITHOUT CARDIOVERSION N/A 10/27/2021   Procedure: TRANSESOPHAGEAL ECHOCARDIOGRAM (TEE);  Surgeon: Adrian Prows, MD;  Location: [facility] ENDOSCOPY;  Service: Cardiovascular;  Laterality: N/A;   TEMPORARY PACEMAKER INSERTION Bilateral 10/07/2014   Procedure: TEMPORARY PACEMAKER INSERTION;  Surgeon: Laverda Page, MD;  Location: [facility] CATH LAB;  Service: Cardiovascular;  Laterality: Bilateral;   WOUND DEBRIDEMENT Right 02/19/2020   Procedure: DEBRIDEMENT WOUND RIGHT FOOT;  Surgeon: Angelia Mould, MD;  Location: [facility] OR;  Service: Vascular;  Laterality: Right;    Scheduled Meds:  atorvastatin  20 mg Oral QHS   calcitRIOL  0.25 mcg Oral Daily   Chlorhexidine Gluconate Cloth  6 each Topical Daily   furosemide  80 mg Oral Once per day on Sun Mon Wed Fri   insulin aspart  0-6 Units Subcutaneous  TID WC   insulin aspart protamine- aspart  16 Units Subcutaneous BID WC   lidocaine  1 patch Transdermal Q24H   metoprolol tartrate  25 mg Oral BID   multivitamin with minerals  1 tablet Oral Once per day on Mon Thu   pantoprazole  40 mg Oral Daily   Continuous Infusions: PRN Meds:.HYDROmorphone (DILAUDID) injection, lidocaine-prilocaine, methocarbamol, ondansetron **OR** ondansetron (ZOFRAN) IV, oxyCODONE, traZODone  Allergies  Allergen Reactions   Cytoxan [Cyclophosphamide] Other (See Comments)    Pancytopenia   Pletal [Cilostazol] Palpitations and Other (See Comments)    "Can hear heart beating loudly".   Keflex [Cephalexin] Nausea And Vomiting   Tylenol [Acetaminophen] Other (See Comments)    Patient has a cold sweat   Social History   Socioeconomic History   Marital status: Married    Spouse name: Not on file   Number of children: 2   Years of education: Not on file   Highest education level: Not on file  Occupational History   Occupation: Disabled  Tobacco Use   Smoking status: Former    Packs/day: 1.50    Years: 30.00    Pack years: 45.00    Types: Cigarettes    Quit date: 10/07/2014    Years since quitting: 7.2   Smokeless tobacco: Never   Tobacco comments:    Does an occasional cigar  Vaping Use   Vaping Use: Never used  Substance and Sexual Activity   Alcohol use: Not Currently    Alcohol/week: 21.0 standard drinks    Types: 21 Glasses of wine per week    Comment: cut way back, none in the last month   Drug use: No   Sexual activity: Not on file  Other Topics Concern   Not on file  Social History Narrative   Not on file   Social Determinants of Health   Financial Resource Strain: Not on file  Food Insecurity: Not on file  Transportation Needs: Not on file  Physical Activity: Not on file  Stress: Not on file  Social Connections: Not on file  Intimate Partner Violence: Not on file   Breast Cancer-relatedfamily history includes Breast cancer in  his sister.   Vitals' \\BP'$  135/77 (BP Location: Left Arm)    Pulse 97  Temp 97.7 F (36.5 C)    Resp 19    Ht 6' (1.829 m)    Wt 94 kg    SpO2 99%    BMI 28.11 kg/m    Physical Exam Constitutional:  Lying in bed, not in acute distress    Comments:   Cardiovascular:     Rate and Rhythm: Normal rate and irregular rhythm      Heart sounds:  Pulmonary:     Effort: Pulmonary effort is normal on room air     Comments: clear lung sounds bilaterally   Abdominal:     Palpations: Abdomen is soft.     Tenderness: non distended and non tender   Musculoskeletal:        General: RT BLA bandaged and left foot TMA bandaged   Skin:    Comments: pictures in the chart reviewed   Neurological:     General: Left leg weakness and unable to move from the bed,awake, alert and oriented   Psychiatric:        Mood and Affect: Mood normal.   Pertinent Microbiology Results for orders placed or performed during the hospital encounter of 01/13/22  Resp Panel by RT-PCR (Flu A&B, Covid) Nasopharyngeal Swab     Status: None   Collection Time: 01/14/22  1:13 AM   Specimen: Nasopharyngeal Swab; Nasopharyngeal(NP) swabs in vial transport medium  Result Value Ref Range Status   SARS Coronavirus 2 by RT PCR NEGATIVE NEGATIVE Final    Comment: (NOTE) SARS-CoV-2 target nucleic acids are NOT DETECTED.  The SARS-CoV-2 RNA is generally detectable in upper respiratory specimens during the acute phase of infection. The lowest concentration of SARS-CoV-2 viral copies this assay can detect is 138 copies/mL. A negative result does not preclude SARS-Cov-2 infection and should not be used as the sole basis for treatment or other patient management decisions. A negative result may occur with  improper specimen collection/handling, submission of specimen other than nasopharyngeal swab, presence of viral mutation(s) within the areas targeted by this assay, and inadequate number of viral copies(<138 copies/mL). A  negative result must be combined with clinical observations, patient history, and epidemiological information. The expected result is Negative.  Fact Sheet for Patients:  EntrepreneurPulse.com.au  Fact Sheet for Healthcare Providers:  IncredibleEmployment.be  This test is no t yet approved or cleared by the [place] FDA and  has been authorized for detection and/or diagnosis of SARS-CoV-2 by FDA under an Emergency Use Authorization (EUA). This EUA will remain  in effect (meaning this test can be used) for the duration of the COVID-19 declaration under Section 564(b)(1) of the Act, 21 U.S.C.section 360bbb-3(b)(1), unless the authorization is terminated  or revoked sooner.       Influenza A by PCR NEGATIVE NEGATIVE Final   Influenza B by PCR NEGATIVE NEGATIVE Final    Comment: (NOTE) The Xpert Xpress SARS-CoV-2/FLU/RSV plus assay is intended as an aid in the diagnosis of influenza from Nasopharyngeal swab specimens and should not be used as a sole basis for treatment. Nasal washings and aspirates are unacceptable for Xpert Xpress SARS-CoV-2/FLU/RSV testing.  Fact Sheet for Patients: EntrepreneurPulse.com.au  Fact Sheet for Healthcare Providers: IncredibleEmployment.be  This test is not yet approved or cleared by the [place] FDA and has been authorized for detection and/or diagnosis of SARS-CoV-2 by FDA under an Emergency Use Authorization (EUA). This EUA will remain in effect (meaning this test can be used) for the duration of the COVID-19 declaration under Section 564(b)(1) of the  Act, 21 U.S.C. section 360bbb-3(b)(1), unless the authorization is terminated or revoked.  Performed at [facility], [place] [address] [place], [place] [address]   MRSA Next Gen by PCR, Nasal     Status: None   Collection Time: 01/14/22  7:18 AM   Specimen: Nasal Mucosa; Nasal Swab  Result Value Ref Range  Status   MRSA by PCR Next Gen NOT DETECTED NOT DETECTED Final    Comment: (NOTE) The GeneXpert MRSA Assay (FDA approved for NASAL specimens only), is one component of a comprehensive MRSA colonization surveillance program. It is not intended to diagnose MRSA infection nor to guide or monitor treatment for MRSA infections. Test performance is not FDA approved in patients less than 52 years old. Performed at [facility], [place] [address], [place], [place] [address]   Culture, blood (routine x 2)     Status: None (Preliminary result)   Collection Time: 01/14/22  3:24 PM   Specimen: BLOOD LEFT WRIST  Result Value Ref Range Status   Specimen Description BLOOD LEFT WRIST  Final   Special Requests   Final    BOTTLES DRAWN AEROBIC AND ANAEROBIC Blood Culture results may not be optimal due to an inadequate volume of blood received in culture bottles   Culture   Final    NO GROWTH 3 DAYS Performed at [facility], [place] [address], [place], [place] [address]    Report Status PENDING  Incomplete  Culture, blood (routine x 2)     Status: None (Preliminary result)   Collection Time: 01/14/22  3:33 PM   Specimen: BLOOD LEFT FOREARM  Result Value Ref Range Status   Specimen Description BLOOD LEFT FOREARM  Final   Special Requests   Final    BOTTLES DRAWN AEROBIC AND ANAEROBIC Blood Culture adequate volume   Culture   Final    NO GROWTH 3 DAYS Performed at [facility], [place] [address] [place],  [address]    Report Status PENDING  Incomplete    Pertinent Lab seen by me: CBC Latest Ref Rng & Units 01/17/2022 01/16/2022 01/15/2022  WBC 4.0 - 10.5 K/uL 9.2 7.5 9.9  Hemoglobin 13.0 - 17.0 g/dL 9.5(L) 9.8(L) 9.5(L)  Hematocrit 39.0 - 52.0 % 30.4(L) 30.8(L) 30.7(L)  Platelets 150 - 400 K/uL 109(L) 92(L) 99(L)   CMP Latest Ref Rng & Units 01/17/2022 01/16/2022 01/15/2022  Glucose 70 - 99 mg/dL 241(H) 181(H) 145(H)  BUN 8 - 23 mg/dL 27(H) 34(H) 24(H)  Creatinine 0.61 - 1.24 mg/dL  2.92(H) 3.99(H) 3.30(H)  Sodium 135 - 145 mmol/L 133(L) 130(L) 133(L)  Potassium 3.5 - 5.1 mmol/L 3.9 4.3 4.1  Chloride 98 - 111 mmol/L 98 95(L) 97(L)  CO2 22 - 32 mmol/L '25 24 25  '$ Calcium 8.9 - 10.3 mg/dL 8.3(L) 8.5(L) 8.4(L)  Total Protein 6.5 - 8.1 g/dL - - -  Total Bilirubin 0.3 - 1.2 mg/dL - - -  Alkaline Phos 38 - 126 U/L - - -  AST 15 - 41 U/L - - -  ALT 0 - 44 U/L - - -    Pertinent Imagings/Other Imagings Plain films and CT images have been personally visualized and interpreted; radiology reports have been reviewed. Decision making incorporated into the Impression / Recommendations.  CT Lumbar Spine Wo Contrast  Result Date: 01/13/2022 CLINICAL DATA:  Low back pain EXAM: CT LUMBAR SPINE WITHOUT CONTRAST TECHNIQUE: Multidetector CT imaging of the lumbar spine was performed without intravenous contrast administration.  Multiplanar CT image reconstructions were also generated. RADIATION DOSE REDUCTION: This exam was performed according to the departmental dose-optimization program which includes automated exposure control, adjustment of the mA and/or kV according to patient size and/or use of iterative reconstruction technique. COMPARISON:  10/28/2021 FINDINGS: Segmentation: 5 lumbar type vertebrae. Alignment: Normal. Vertebrae: There are bilateral laminectomies at L3 and L4. There is progressive increase in size of inferior L2 and superior L3 endplate deformities. Gas in the disc space argues against infection. No acute fracture. Paraspinal and other soft tissues: Calcific aortic atherosclerosis. Disc levels: T12-L1: Unremarkable. L1-2: No spinal canal stenosis. L2-3: Progression of endplate changes. Large disc bulge with osteophytes. Moderate spinal canal and bilateral neural foraminal stenosis. L3-4: Posterior decompression without spinal canal stenosis or neural impingement. L4-5: Small left subarticular disc protrusion without stenosis. L5-S1: Disc bulge and endplate spurring with moderate  bilateral foraminal stenosis. No spinal canal stenosis. IMPRESSION: 1. Progressive increase in size of inferior L2 and superior L3 endplate deformities, likely degenerative. Gas in the disc space argues against infection. 2. Moderate spinal canal and bilateral neural foraminal stenosis at L2-3. 3. Moderate bilateral L5-S1 foraminal stenosis. Aortic Atherosclerosis (ICD10-I70.0). Electronically Signed   By: Ulyses Jarred M.D.   On: 01/13/2022 23:15   MR LUMBAR SPINE WO CONTRAST  Result Date: 01/15/2022 CLINICAL DATA:  Fall today with left lower extremity weakness. Known lumbar spine compression deformities. EXAM: MRI LUMBAR SPINE WITHOUT CONTRAST TECHNIQUE: Multiplanar, multisequence MR imaging of the lumbar spine was performed. No intravenous contrast was administered. COMPARISON:  CT lumbar spine 01/13/2022, 10/28/2021 and 09/16/2021. FINDINGS: Despite efforts by the technologist and patient, mild motion artifact is present on today's exam and could not be eliminated. This reduces exam sensitivity and specificity. Segmentation: Conventional anatomy assumed, with the last open disc space designated L5-S1.Concordant with previous imaging. Alignment:  Physiologic. Vertebrae: As demonstrated on recent CT, there are progressive endplate deformities involving the inferior endplate of L2 and superior endplate of L3 compared with previous CT of 09/16/2021. These deformities are associated with adjacent endplate sclerosis and no significant surrounding marrow edema. There is nonspecific T2 hyperintensity within the L2-3 disc. Endplate degenerative changes are also present at L4-5. Previous bilateral laminectomies at L3 and L4. The visualized sacroiliac joints appear unremarkable. Conus medullaris: Extends to the L2 level and appears normal. Paraspinal and other soft tissues: Mild generalized soft tissue edema and muscular atrophy. No focal paraspinal fluid collection identified. Disc levels: Sagittal images demonstrate  mild disc degeneration and bulging at T11-12 without resulting spinal stenosis or nerve root encroachment. T12-L1: Normal interspace. L1-2: Mild disc bulging and bilateral facet hypertrophy. No significant spinal stenosis or nerve root encroachment. L2-3: As above, progressive endplate deformities with annular disc bulging and a right paracentral disc protrusion. Moderate facet and ligamentous hypertrophy. Resulting severe spinal stenosis with lateral recess and foraminal narrowing bilaterally. L3-4: Previous posterior decompression. Mild loss of disc height with annular disc bulging and mild facet hypertrophy. No significant residual spinal stenosis or nerve root encroachment. L4-5: Previous posterior decompression. Loss of disc height with annular disc bulging and a broad-based disc protrusion in the left subarticular zone. Mild bilateral facet hypertrophy. Mild asymmetric left lateral recess and left foraminal narrowing. L5-S1: Chronic degenerative disc disease with loss of disc height, annular disc bulging and endplate osteophytes. Chronic moderate foraminal narrowing bilaterally appears unchanged. IMPRESSION: 1. As seen on recent CT, there are progressive endplate deformities adjacent to the L2-3 disc with associated nonspecific discal hyperintensity. Based on previous studies, findings may  reflect progressive subacute fractures with progressive endplate degeneration. Partially treated discitis/osteomyelitis is a consideration, although there is no significant bone marrow edema or paraspinal fluid collection. 2. Associated disc degeneration with annular disc bulging, facet and ligamentous hypertrophy contributing to severe multifactorial spinal stenosis and moderate narrowing of the lateral recesses and foramina bilaterally. 3. Previous posterior decompression at L3-4 and L4-5 without significant recurrent spinal stenosis. 4. Chronic moderate foraminal narrowing bilaterally at L5-S1 due to disc bulging and  endplate osteophytes. Electronically Signed   By: Richardean Sale M.D.   On: 01/15/2022 14:17    I spent more than 80 minutes for this patient encounter including review of prior medical records/discussing diagnostics and treatment plan with the patient/family/coordinate care with primary/other specialits with greater than 50% of time in face to face encounter.   Electronically signed by:   Rosiland Oz, MD Infectious Disease Physician [facility] Pager: [phone]

## 2022-01-17 NOTE — Progress Notes (Signed)
?[facility] ?Progress Note  ? ?Subjective:    ?Seen and examined patient at bedside. No complaints. Tolerated yesterday's HD with net UF 2L. IR consulted-plan L2-3 disc aspiration. Plan for HD 3/9. ? ?Objective ?Vitals:  ? 01/16/22 2054 01/17/22 0411 01/17/22 1005 01/17/22 1743  ?BP: 111/67 109/80 135/77 122/76  ?Pulse: 90 84 97 84  ?Resp: '18 18 19 18  '$ ?Temp: 97.6 ?F (36.4 ?C) 97.7 ?F (36.5 ?C)  97.9 ?F (36.6 ?C)  ?TempSrc:      ?SpO2: 98% 97% 99% 99%  ?Weight:      ?Height:      ? ?Physical Exam ?General: Lying in bed on HD; NAD ?Lungs: Clear anteriorly; No wheeze, rales or rhonchi. Breathing is unlabored. ?Heart: RRR. No murmur, rubs or gallops.  ?Abdomen: soft, nontender, +BS ?Lower extremities: R BKA; No edema LLE ?Neuro: AAOx3. Moves all extremities spontaneously. ?Dialysis Access: R AVF (+) B/T ? ?Filed Weights  ? 01/14/22 0317 01/16/22 1220 01/16/22 1558  ?Weight: 93.4 kg 96.1 kg 94 kg  ? ? ?Intake/Output Summary (Last 24 hours) at 01/17/2022 1803 ?Last data filed at 01/17/2022 1100 ?Gross per 24 hour  ?Intake 540 ml  ?Output --  ?Net 540 ml  ? ? ?Additional Objective ?Labs: ?Basic Metabolic Panel: ?Recent Labs  ?Lab 01/15/22 ?8891 01/16/22 ?0333 01/17/22 ?0232  ?NA 133* 130* 133*  ?K 4.1 4.3 3.9  ?CL 97* 95* 98  ?CO2 '25 24 25  '$ ?GLUCOSE 145* 181* 241*  ?BUN 24* 34* 27*  ?CREATININE 3.30* 3.99* 2.92*  ?CALCIUM 8.4* 8.5* 8.3*  ?PHOS  --  4.9*  --   ? ?Liver Function Tests: ?Recent Labs  ?Lab 01/13/22 ?1505  ?AST 25  ?ALT 19  ?ALKPHOS 91  ?BILITOT 0.9  ?PROT 5.5*  ?ALBUMIN 2.4*  ? ?No results for input(s): LIPASE, AMYLASE in the last 168 hours. ?CBC: ?Recent Labs  ?Lab 01/13/22 ?1505 01/14/22 ?6945 01/15/22 ?0388 01/16/22 ?8280 01/17/22 ?0232  ?WBC 9.2 12.8* 9.9 7.5 9.2  ?NEUTROABS 6.7  --  7.0 6.1 7.4  ?HGB 10.9* 11.0* 9.5* 9.8* 9.5*  ?HCT 36.0* 35.2* 30.7* 30.8* 30.4*  ?MCV 100.6* 100.0 99.4 97.8 98.1  ?PLT PLATELET CLUMPS NOTED ON SMEAR, UNABLE TO ESTIMATE 104* 99* 92* 109*  ? ?Blood Culture ?    ?Component Value Date/Time  ? SDES BLOOD LEFT FOREARM 01/14/2022 1533  ? SPECREQUEST  01/14/2022 1533  ?  BOTTLES DRAWN AEROBIC AND ANAEROBIC Blood Culture adequate volume  ? CULT  01/14/2022 1533  ?  NO GROWTH 3 DAYS ?Performed at [facility], [place] [address], [place], [place] [address] ?  ? REPTSTATUS PENDING 01/14/2022 1533  ? ? ?Cardiac Enzymes: ?No results for input(s): CKTOTAL, CKMB, CKMBINDEX, TROPONINI in the last 168 hours. ?CBG: ?Recent Labs  ?Lab 01/16/22 ?1720 01/16/22 ?2055 01/17/22 ?0753 01/17/22 ?1155 01/17/22 ?1650  ?GLUCAP 211* 180* 253* 215* 166*  ? ?Iron Studies: No results for input(s): IRON, TIBC, TRANSFERRIN, FERRITIN in the last 72 hours. ?Lab Results  ?Component Value Date  ? INR 1.9 (H) 01/17/2022  ? INR 3.0 (H) 10/30/2021  ? INR 1.2 07/22/2020  ? ?Studies/Results: ?No results found. ? ?Medications: ? ? atorvastatin  20 mg Oral QHS  ? calcitRIOL  0.25 mcg Oral Daily  ? Chlorhexidine Gluconate Cloth  6 each Topical Daily  ? furosemide  80 mg Oral Once per day on Sun Mon Wed Fri  ? insulin aspart  0-6 Units Subcutaneous TID WC  ? insulin aspart protamine- aspart  16 Units Subcutaneous BID WC  ? lidocaine  1 patch Transdermal Q24H  ? metoprolol tartrate  25 mg Oral BID  ? multivitamin with minerals  1 tablet Oral Once per day on Mon Thu  ? pantoprazole  40 mg Oral Daily  ? ? ?Dialysis Orders: ?TTS - GOC ?4hrs29mn, BFR 400, DFR 800,  EDW 94kg, 3K/ 2.5Ca ?No Systemic Heparin ?Mircera 200 mcg q2wks - last 01/11/22 ?Calcitriol 0.240m PO qHD - last 01/13/22 ?  ?Assessment/Plan: ?1 Lumbar back pain/Radiculopathy: Followed by Spine Surgery Dr. CrSaintclair HalstedMR Lumbar Spine completed: showed fractures L2-L3 with fluid-? Trauma vs. Infection. Reviewed Dr. CrWindy Carinaast note: recommend surgical decompression. IR consulted-plan for L2-3 disc aspiration, Eliquis held and monitoring INR before they can proceed. Continue pain management and Pt/OT ?2 ESRD - on HD TTS. Tolerated yesterday;s HD with net UF 2L. Plan  for HD 3/9. ?3  Hypertension/volume  - Currently not volume overloaded. Bps stable ?4. Anemia of CKD - Hgb 9.8. ESA not due yet ?5. Secondary Hyperparathyroidism - PO4 at goal. Continue VDRA. ?6. Nutrition - Renal diet on fluid restriction ? ?CoTobie PoetNP ?[facility] ?01/17/2022,6:03 PM ? LOS: 4 days  ?  ?

## 2022-01-17 NOTE — Care Management Important Message (Signed)
Important Message ? ?Patient Details  ?Name: Alexander [name] ?MRN: [id number] ?Date of Birth: 1961/02/10 ? ? ?Medicare Important Message Given:  Yes ? ?IM delivered on 01/16/2022  ? ? ?Sheyla Zaffino ?01/17/2022, 10:53 AM ?

## 2022-01-17 NOTE — Progress Notes (Signed)
Pt receives out-pt HD at [facility] on TTS. Pt arrives at 5:05 for 5:25 chair time. Will assist as needed.  ? ?Melven Sartorius ?Renal Navigator ?[phone] ?

## 2022-01-18 ENCOUNTER — Other Ambulatory Visit (HOSPITAL_COMMUNITY): Payer: Self-pay | Admitting: *Deleted

## 2022-01-18 ENCOUNTER — Other Ambulatory Visit: Payer: Self-pay | Admitting: Neurosurgery

## 2022-01-18 ENCOUNTER — Inpatient Hospital Stay (HOSPITAL_COMMUNITY): Payer: Medicare Other

## 2022-01-18 DIAGNOSIS — M5416 Radiculopathy, lumbar region: Secondary | ICD-10-CM | POA: Diagnosis not present

## 2022-01-18 DIAGNOSIS — Z992 Dependence on renal dialysis: Secondary | ICD-10-CM | POA: Diagnosis not present

## 2022-01-18 DIAGNOSIS — N186 End stage renal disease: Secondary | ICD-10-CM | POA: Diagnosis not present

## 2022-01-18 LAB — BASIC METABOLIC PANEL
Anion gap: 11 (ref 5–15)
BUN: 39 mg/dL — ABNORMAL HIGH (ref 8–23)
CO2: 25 mmol/L (ref 22–32)
Calcium: 8.5 mg/dL — ABNORMAL LOW (ref 8.9–10.3)
Chloride: 97 mmol/L — ABNORMAL LOW (ref 98–111)
Creatinine, Ser: 3.56 mg/dL — ABNORMAL HIGH (ref 0.61–1.24)
GFR, Estimated: 18 mL/min — ABNORMAL LOW (ref >60)
Glucose, Bld: 202 mg/dL — ABNORMAL HIGH (ref 70–99)
Potassium: 5.1 mmol/L (ref 3.5–5.1)
Sodium: 133 mmol/L — ABNORMAL LOW (ref 135–145)

## 2022-01-18 LAB — CBC WITH DIFFERENTIAL/PLATELET
Abs Immature Granulocytes: 0.04 10*3/uL (ref 0.00–0.07)
Basophils Absolute: 0 10*3/uL (ref 0.0–0.1)
Basophils Relative: 0 %
Eosinophils Absolute: 0 10*3/uL (ref 0.0–0.5)
Eosinophils Relative: 0 %
HCT: 31.8 % — ABNORMAL LOW (ref 39.0–52.0)
Hemoglobin: 9.9 g/dL — ABNORMAL LOW (ref 13.0–17.0)
Immature Granulocytes: 0 %
Lymphocytes Relative: 25 %
Lymphs Abs: 2.4 10*3/uL (ref 0.7–4.0)
MCH: 31.2 pg (ref 26.0–34.0)
MCHC: 31.1 g/dL (ref 30.0–36.0)
MCV: 100.3 fL — ABNORMAL HIGH (ref 80.0–100.0)
Monocytes Absolute: 0.8 10*3/uL (ref 0.1–1.0)
Monocytes Relative: 8 %
Neutro Abs: 6.2 10*3/uL (ref 1.7–7.7)
Neutrophils Relative %: 67 %
Platelets: 121 10*3/uL — ABNORMAL LOW (ref 150–400)
RBC: 3.17 MIL/uL — ABNORMAL LOW (ref 4.22–5.81)
RDW: 18.8 % — ABNORMAL HIGH (ref 11.5–15.5)
WBC: 9.5 10*3/uL (ref 4.0–10.5)
nRBC: 0.5 % — ABNORMAL HIGH (ref 0.0–0.2)

## 2022-01-18 LAB — PHOSPHORUS: Phosphorus: 4.9 mg/dL — ABNORMAL HIGH (ref 2.5–4.6)

## 2022-01-18 LAB — PROTIME-INR
INR: 1.4 — ABNORMAL HIGH (ref 0.8–1.2)
Prothrombin Time: 17.5 seconds — ABNORMAL HIGH (ref 11.4–15.2)

## 2022-01-18 LAB — GLUCOSE, CAPILLARY
Glucose-Capillary: 154 mg/dL — ABNORMAL HIGH (ref 70–99)
Glucose-Capillary: 129 mg/dL — ABNORMAL HIGH (ref 70–99)
Glucose-Capillary: 137 mg/dL — ABNORMAL HIGH (ref 70–99)
Glucose-Capillary: 216 mg/dL — ABNORMAL HIGH (ref 70–99)

## 2022-01-18 MED ORDER — FENTANYL CITRATE (PF) 100 MCG/2ML IJ SOLN
INTRAMUSCULAR | Status: AC | PRN
  Administered 2022-01-18: 50 ug via INTRAVENOUS

## 2022-01-18 MED ORDER — BUPIVACAINE HCL (PF) 0.5 % IJ SOLN
INTRAMUSCULAR | Status: AC
  Filled 2022-01-18: qty 30

## 2022-01-18 MED ORDER — LIDOCAINE HCL 1 % IJ SOLN
INTRAMUSCULAR | Status: AC
  Filled 2022-01-18: qty 20

## 2022-01-18 MED ORDER — MIDAZOLAM HCL 2 MG/2ML IJ SOLN
INTRAMUSCULAR | Status: AC
  Filled 2022-01-18: qty 2

## 2022-01-18 MED ORDER — FENTANYL CITRATE (PF) 100 MCG/2ML IJ SOLN
INTRAMUSCULAR | Status: AC
  Filled 2022-01-18: qty 2

## 2022-01-18 NOTE — Sedation Documentation (Signed)
Pt unable to transfer to table for procedure even with assistance and with pain medication due to intolerable pain with the slightest movement. Dr. Ladean Raya notified. Per MD, abort procedure at this time. Pt notified of plan and is in agreement.  ?

## 2022-01-18 NOTE — Progress Notes (Signed)
?[facility] ?Progress Note  ? ?Subjective: Seen in room. No C/Os. Going to IR for disc aspiration today. HD later today.   ? ?Objective ?Vitals:  ? 01/17/22 1743 01/17/22 2126 01/18/22 0512 01/18/22 1046  ?BP: 122/76 114/63 100/69 109/75  ?Pulse: 84 85 92 76  ?Resp: '18 18 18 16  '$ ?Temp: 97.9 ?F (36.6 ?C) 97.6 ?F (36.4 ?C) 97.6 ?F (36.4 ?C) (!) 97.5 ?F (36.4 ?C)  ?TempSrc:   Oral Oral  ?SpO2: 99% 97% 100% 100%  ?Weight:      ?Height:      ? ?Physical Exam ?General: Chronically ill appearing male in NAD.  ?Heart: S1,S2 RRR. No M/R/G ?Lungs: CTAB ?Abdomen: obese, NT. NABS ?Extremities:BLE wrapped in unaboots. R BKA, L toe amps.  ?Dialysis Access: R AVF + T/B ? ? ?Additional Objective ?Labs: ?Basic Metabolic Panel: ?Recent Labs  ?Lab 01/16/22 ?0333 01/17/22 ?0232 01/18/22 ?9371  ?NA 130* 133* 133*  ?K 4.3 3.9 5.1  ?CL 95* 98 97*  ?CO2 '24 25 25  '$ ?GLUCOSE 181* 241* 202*  ?BUN 34* 27* 39*  ?CREATININE 3.99* 2.92* 3.56*  ?CALCIUM 8.5* 8.3* 8.5*  ?PHOS 4.9*  --  4.9*  ? ?Liver Function Tests: ?Recent Labs  ?Lab 01/13/22 ?1505  ?AST 25  ?ALT 19  ?ALKPHOS 91  ?BILITOT 0.9  ?PROT 5.5*  ?ALBUMIN 2.4*  ? ?No results for input(s): LIPASE, AMYLASE in the last 168 hours. ?CBC: ?Recent Labs  ?Lab 01/14/22 ?6967 01/15/22 ?8938 01/16/22 ?1017 01/17/22 ?5102 01/18/22 ?5852  ?WBC 12.8* 9.9 7.5 9.2 9.5  ?NEUTROABS  --  7.0 6.1 7.4 6.2  ?HGB 11.0* 9.5* 9.8* 9.5* 9.9*  ?HCT 35.2* 30.7* 30.8* 30.4* 31.8*  ?MCV 100.0 99.4 97.8 98.1 100.3*  ?PLT 104* 99* 92* 109* 121*  ? ?Blood Culture ?   ?Component Value Date/Time  ? SDES BLOOD LEFT FOREARM 01/14/2022 1533  ? SPECREQUEST  01/14/2022 1533  ?  BOTTLES DRAWN AEROBIC AND ANAEROBIC Blood Culture adequate volume  ? CULT  01/14/2022 1533  ?  NO GROWTH 4 DAYS ?Performed at [facility], [place] [address], [place], [place] [address] ?  ? REPTSTATUS PENDING 01/14/2022 1533  ? ? ?Cardiac Enzymes: ?No results for input(s): CKTOTAL, CKMB, CKMBINDEX, TROPONINI in the last 168  hours. ?CBG: ?Recent Labs  ?Lab 01/17/22 ?1155 01/17/22 ?1650 01/17/22 ?2127 01/18/22 ?0735 01/18/22 ?1149  ?GLUCAP 215* 166* 200* 154* 129*  ? ?Iron Studies: No results for input(s): IRON, TIBC, TRANSFERRIN, FERRITIN in the last 72 hours. ?'@lablastinr3'$ @ ?Studies/Results: ?No results found. ?Medications: ? sodium chloride    ? sodium chloride    ? ? atorvastatin  20 mg Oral QHS  ? calcitRIOL  0.25 mcg Oral Daily  ? Chlorhexidine Gluconate Cloth  6 each Topical Daily  ? furosemide  80 mg Oral Once per day on Sun Mon Wed Fri  ? insulin aspart  0-6 Units Subcutaneous TID WC  ? insulin aspart protamine- aspart  16 Units Subcutaneous BID WC  ? lidocaine  1 patch Transdermal Q24H  ? metoprolol tartrate  25 mg Oral BID  ? multivitamin with minerals  1 tablet Oral Once per day on Mon Thu  ? pantoprazole  40 mg Oral Daily  ? ? ? ?Dialysis Orders: ?TTS - GOC ?4hrs19mn, BFR 400, DFR 800,  EDW 94kg, 3K/ 2.5Ca ?No Systemic Heparin ?Mircera 200 mcg q2wks - last 01/11/22 ?Calcitriol 0.252m PO qHD - last 01/13/22 ?  ?Assessment/Plan: ?1 Lumbar back pain/Radiculopathy: Followed by Spine Surgery Dr.  Cram. MR Lumbar Spine completed: showed fractures L2-L3 with fluid-? Trauma vs. Infection. Reviewed Dr. Windy Carina last note: recommend surgical decompression. IR consulted-plan for L2-3 disc aspiration, Eliquis held and monitoring INR before they can proceed. Continue pain management and Pt/OT ?2 ESRD - on HD TTS. Tolerated yesterday;s HD with net UF 2L. Plan for HD 3/9. ?3  Hypertension/volume  - Currently not volume overloaded. Bps stable ?4. Anemia of CKD - Hgb 9.8. ESA not due yet ?5. Secondary Hyperparathyroidism - PO4 at goal. Continue VDRA. ?6. Nutrition - Renal diet on fluid restriction ? ?Chena Chohan H. Nelvin Tomb NP-C ?01/18/2022, 12:55 PM  ?[facility] ?[phone] ? ? ?  ? ?

## 2022-01-18 NOTE — Progress Notes (Signed)
PROGRESS NOTE    Alexander [name]  KCM:[id number] DOB: July 16, 1961 DOA: 01/13/2022 PCP: [name], MD    Brief Narrative:  Alexander [name] is a 61 y.o. male with medical history significant for ESRD on HD on TTS, HTN, HLD, PVD status post right BKA and left midfoot amputation, Afib on Eliquis, CAD, DM2, and status post pacemaker, presented to the ER with worsening intractable low back pain over the last couple of days with associated left lower extremity radiation and radiculopathy with associated spasm and weakness, denies any urinary or stool incontinence. He has been having chronic low back pain with occasional radiation to both lower extremities. Pt has been followed by spine surgery and was scheduled for LS spine MRI on 02/07/2022.  Plan is for disc aspiration and if no infection then patient will have surgical decompression.      Assessment and Plan: * Lumbar back pain with radiculopathy affecting left lower extremity CRP, ESR elevated CT noted moderate neuroforaminal stenosis at L2-L3 and L5-S1 MRI lumbar spine showed subacute fractures L2-L3 disc, partially treated discitis/osteomyelitis is a consideration, severe multifactorial spinal stenosis at L2-3 Followed by spine surgery Dr Saintclair Halsted, rec disc aspiration by IR to rule out any discitis and plan for surgical decompression IR consulted for disc aspiration to rule out any infection, likely on 01/18/2022 Completed IV decadron Pain management as able PT/OT- recommending SNF  ESRD (end stage renal disease) ([place]) HD- T/T/S Nephrology consulted  PAF (paroxysmal atrial fibrillation) ([place]) Continue Lopressor and hold Eliquis due to upcoming procedure  Essential hypertension, benign Continue Lopressor  Dyslipidemia - We will continue statin therapy.  Type 2 diabetes mellitus with chronic kidney disease, with long-term current use of insulin (HCC) Last A1c 6.8 SSI, NovoLog 70/30 twice daily, hypoglycemic protocol, Accu-Cheks  GERD  without esophagitis Continue PPI therapy.  Below-knee amputation of right lower extremity ([place]) At outpatient visit on 01/10/2022 with orthopedics, noted BLE with significant weeping, edema with induration, both noted with distal maceration with L LE noted open ulceration Concern for ischemic changes, placed in Unna and Dynaflex compression wraps bilaterally Consulted [name], we will continue with serial compression wraps and outpatient follow-up  Pressure injury of skin Pressure Injury 01/14/22 Ischial tuberosity Right Stage 3 -  Full thickness tissue loss. Subcutaneous fat may be visible but bone, tendon or muscle are NOT exposed. (Active)  01/14/22 0321  Location: Ischial tuberosity  Location Orientation: Right  Staging: Stage 3 -  Full thickness tissue loss. Subcutaneous fat may be visible but bone, tendon or muscle are NOT exposed.  Wound Description (Comments):   Present on Admission: Yes     Pressure Injury 01/14/22 Buttocks Right Stage 2 -  Partial thickness loss of dermis presenting as a shallow open injury with a red, pink wound bed without slough. (Active)  01/14/22 0322  Location: Buttocks  Location Orientation: Right  Staging: Stage 2 -  Partial thickness loss of dermis presenting as a shallow open injury with a red, pink wound bed without slough.  Wound Description (Comments):   Present on Admission: Yes     Pressure Injury 01/14/22 Sacrum Mid Stage 2 -  Partial thickness loss of dermis presenting as a shallow open injury with a red, pink wound bed without slough. (Active)  01/14/22 0322  Location: Sacrum  Location Orientation: Mid  Staging: Stage 2 -  Partial thickness loss of dermis presenting as a shallow open injury with a red, pink wound bed without slough.  Wound Description (  Comments):   Present on Admission: Yes     Pressure Injury 01/14/22 Buttocks Left Stage 2 -  Partial thickness loss of dermis presenting as a shallow open injury with a red, pink wound bed  without slough. (Active)  01/14/22 0323  Location: Buttocks  Location Orientation: Left  Staging: Stage 2 -  Partial thickness loss of dermis presenting as a shallow open injury with a red, pink wound bed without slough.  Wound Description (Comments):   Present on Admission: Yes     Pressure Injury 01/14/22 Buttocks Posterior;Proximal;Right Stage 2 -  Partial thickness loss of dermis presenting as a shallow open injury with a red, pink wound bed without slough. (Active)  01/14/22 0324  Location: Buttocks  Location Orientation: Posterior;Proximal;Right  Staging: Stage 2 -  Partial thickness loss of dermis presenting as a shallow open injury with a red, pink wound bed without slough.  Wound Description (Comments):   Present on Admission: Yes       Pacemaker infection ([place]) Follows with ID outpatient -apprecaited ID consult-- will plan to resume abx after aspiration -last progress note: Plan to transition to doxy/amoxicillin 100 mg po bid and 500 mg po qhs -- indefinitely          DVT prophylaxis: scd    Code Status: Full Code   Disposition Plan:  Level of care: Med-Surg Status is: Inpatient Remains inpatient appropriate because: needs surgery    Consultants:  ID Renal    Subjective: Robaxin helped  Objective: Vitals:   01/17/22 1743 01/17/22 2126 01/18/22 0512 01/18/22 1046  BP: 122/76 114/63 100/69 109/75  Pulse: 84 85 92 76  Resp: _0 Temp: 97.9 F (36.6 C) 97.6 F (36.4 C) 97.6 F (36.4 C) (!) 97.5 F (36.4 C)  TempSrc:   Oral Oral  SpO2: 99% 97% 100% 100%  Weight:      Height:        Intake/Output Summary (Last 24 hours) at 01/18/2022 1415 Last data filed at 01/18/2022 0809 Gross per 24 hour  Intake 360 ml  Output 0 ml  Net 360 ml   Filed Weights   01/14/22 0317 01/16/22 1220 01/16/22 1558  Weight: 93.4 kg 96.1 kg 94 kg    Examination:   General: Appearance:     Overweight male in no acute distress        Heart:    Normal  heart rate.    MS:   Below knee amputation of right lower extremity is noted. Amputation of left foot is noted. Left  amputation noted. Right  amputation noted.    Neurologic:   Awake, alert       Data Reviewed: I have personally reviewed following labs and imaging studies  CBC: Recent Labs  Lab 01/13/22 1505 01/14/22 0415 01/15/22 0342 01/16/22 0333 01/17/22 0232 01/18/22 0337  WBC 9.2 12.8* 9.9 7.5 9.2 9.5  NEUTROABS 6.7  --  7.0 6.1 7.4 6.2  HGB 10.9* 11.0* 9.5* 9.8* 9.5* 9.9*  HCT 36.0* 35.2* 30.7* 30.8* 30.4* 31.8*  MCV 100.6* 100.0 99.4 97.8 98.1 100.3*  PLT PLATELET CLUMPS NOTED ON SMEAR, UNABLE TO ESTIMATE 104* 99* 92* 109* 086*   Basic Metabolic Panel: Recent Labs  Lab 01/13/22 1505 01/14/22 0415 01/15/22 0342 01/16/22 0333 01/17/22 0232 01/18/22 0337  NA 135 135 133* 130* 133* 133*  K 3.7 3.9 4.1 4.3 3.9 5.1  CL 96* 96* 97* 95* 98 97*  CO2 _1 GLUCOSE  157* 153* 145* 181* 241* 202*  BUN 14 17 24* 34* 27* 39*  CREATININE 2.03* 2.58* 3.30* 3.99* 2.92* 3.56*  CALCIUM 8.4* 8.4* 8.4* 8.5* 8.3* 8.5*  MG 1.7  --   --   --   --   --   PHOS  --   --   --  4.9*  --  4.9*   GFR: Estimated Creatinine Clearance: 25.3 mL/min (A) (by C-G formula based on SCr of 3.56 mg/dL (H)). Liver Function Tests: Recent Labs  Lab 01/13/22 1505  AST 25  ALT 19  ALKPHOS 91  BILITOT 0.9  PROT 5.5*  ALBUMIN 2.4*   No results for input(s): LIPASE, AMYLASE in the last 168 hours. No results for input(s): AMMONIA in the last 168 hours. Coagulation Profile: Recent Labs  Lab 01/17/22 0232 01/18/22 0337  INR 1.9* 1.4*   Cardiac Enzymes: No results for input(s): CKTOTAL, CKMB, CKMBINDEX, TROPONINI in the last 168 hours. BNP (last 3 results) No results for input(s): PROBNP in the last 8760 hours. HbA1C: No results for input(s): HGBA1C in the last 72 hours. CBG: Recent Labs  Lab 01/17/22 1155 01/17/22 1650 01/17/22 2127 01/18/22 0735 01/18/22 1149   GLUCAP 215* 166* 200* 154* 129*   Lipid Profile: No results for input(s): CHOL, HDL, LDLCALC, TRIG, CHOLHDL, LDLDIRECT in the last 72 hours. Thyroid Function Tests: No results for input(s): TSH, T4TOTAL, FREET4, T3FREE, THYROIDAB in the last 72 hours. Anemia Panel: No results for input(s): VITAMINB12, FOLATE, FERRITIN, TIBC, IRON, RETICCTPCT in the last 72 hours. Sepsis Labs: No results for input(s): PROCALCITON, LATICACIDVEN in the last 168 hours.  Recent Results (from the past 240 hour(s))  Resp Panel by RT-PCR (Flu A&B, Covid) Nasopharyngeal Swab     Status: None   Collection Time: 01/14/22  1:13 AM   Specimen: Nasopharyngeal Swab; Nasopharyngeal(NP) swabs in vial transport medium  Result Value Ref Range Status   SARS Coronavirus 2 by RT PCR NEGATIVE NEGATIVE Final    Comment: (NOTE) SARS-CoV-2 target nucleic acids are NOT DETECTED.  The SARS-CoV-2 RNA is generally detectable in upper respiratory specimens during the acute phase of infection. The lowest concentration of SARS-CoV-2 viral copies this assay can detect is 138 copies/mL. A negative result does not preclude SARS-Cov-2 infection and should not be used as the sole basis for treatment or other patient management decisions. A negative result may occur with  improper specimen collection/handling, submission of specimen other than nasopharyngeal swab, presence of viral mutation(s) within the areas targeted by this assay, and inadequate number of viral copies(<138 copies/mL). A negative result must be combined with clinical observations, patient history, and epidemiological information. The expected result is Negative.  Fact Sheet for Patients:  EntrepreneurPulse.com.au  Fact Sheet for Healthcare Providers:  IncredibleEmployment.be  This test is no t yet approved or cleared by the [place] FDA and  has been authorized for detection and/or diagnosis of SARS-CoV-2 by FDA under an  Emergency Use Authorization (EUA). This EUA will remain  in effect (meaning this test can be used) for the duration of the COVID-19 declaration under Section 564(b)(1) of the Act, 21 U.S.C.section 360bbb-3(b)(1), unless the authorization is terminated  or revoked sooner.       Influenza A by PCR NEGATIVE NEGATIVE Final   Influenza B by PCR NEGATIVE NEGATIVE Final    Comment: (NOTE) The Xpert Xpress SARS-CoV-2/FLU/RSV plus assay is intended as an aid in the diagnosis of influenza from Nasopharyngeal swab specimens and should not be used as  a sole basis for treatment. Nasal washings and aspirates are unacceptable for Xpert Xpress SARS-CoV-2/FLU/RSV testing.  Fact Sheet for Patients: EntrepreneurPulse.com.au  Fact Sheet for Healthcare Providers: IncredibleEmployment.be  This test is not yet approved or cleared by the [place] FDA and has been authorized for detection and/or diagnosis of SARS-CoV-2 by FDA under an Emergency Use Authorization (EUA). This EUA will remain in effect (meaning this test can be used) for the duration of the COVID-19 declaration under Section 564(b)(1) of the Act, 21 U.S.C. section 360bbb-3(b)(1), unless the authorization is terminated or revoked.  Performed at [facility], [place] [address] [place], [place] [address]   MRSA Next Gen by PCR, Nasal     Status: None   Collection Time: 01/14/22  7:18 AM   Specimen: Nasal Mucosa; Nasal Swab  Result Value Ref Range Status   MRSA by PCR Next Gen NOT DETECTED NOT DETECTED Final    Comment: (NOTE) The GeneXpert MRSA Assay (FDA approved for NASAL specimens only), is one component of a comprehensive MRSA colonization surveillance program. It is not intended to diagnose MRSA infection nor to guide or monitor treatment for MRSA infections. Test performance is not FDA approved in patients less than 15 years old. Performed at [facility], [place] [address],  [place], [place] [address]   Culture, blood (routine x 2)     Status: None (Preliminary result)   Collection Time: 01/14/22  3:24 PM   Specimen: BLOOD LEFT WRIST  Result Value Ref Range Status   Specimen Description BLOOD LEFT WRIST  Final   Special Requests   Final    BOTTLES DRAWN AEROBIC AND ANAEROBIC Blood Culture results may not be optimal due to an inadequate volume of blood received in culture bottles   Culture   Final    NO GROWTH 4 DAYS Performed at [facility], [place] [address] [place], [place] [address]    Report Status PENDING  Incomplete  Culture, blood (routine x 2)     Status: None (Preliminary result)   Collection Time: 01/14/22  3:33 PM   Specimen: BLOOD LEFT FOREARM  Result Value Ref Range Status   Specimen Description BLOOD LEFT FOREARM  Final   Special Requests   Final    BOTTLES DRAWN AEROBIC AND ANAEROBIC Blood Culture adequate volume   Culture   Final    NO GROWTH 4 DAYS Performed at [facility], [place] [address] [place], [place] [address]    Report Status PENDING  Incomplete         Radiology Studies: No results found.      Scheduled Meds:  atorvastatin  20 mg Oral QHS   calcitRIOL  0.25 mcg Oral Daily   Chlorhexidine Gluconate Cloth  6 each Topical Daily   furosemide  80 mg Oral Once per day on Sun Mon Wed Fri   insulin aspart  0-6 Units Subcutaneous TID WC   insulin aspart protamine- aspart  16 Units Subcutaneous BID WC   lidocaine  1 patch Transdermal Q24H   metoprolol tartrate  25 mg Oral BID   multivitamin with minerals  1 tablet Oral Once per day on Mon Thu   pantoprazole  40 mg Oral Daily   Continuous Infusions:  sodium chloride     sodium chloride       LOS: 5 days    Time spent: 55 minutes spent on chart review, discussion with nursing staff, consultants, updating family and interview/physical exam; more  than 50% of that time was spent in counseling and/or coordination of care.    Geradine Girt, DO [facility] Available via Epic secure chat 7am-7pm After these hours, please refer to coverage provider listed on [email or web address] 01/18/2022, 2:15 PM

## 2022-01-19 ENCOUNTER — Encounter (HOSPITAL_COMMUNITY): Payer: Self-pay | Admitting: Family Medicine

## 2022-01-19 ENCOUNTER — Inpatient Hospital Stay (HOSPITAL_COMMUNITY): Payer: Medicare Other | Admitting: Certified Registered Nurse Anesthetist

## 2022-01-19 ENCOUNTER — Encounter (HOSPITAL_COMMUNITY)
Admission: EM | Disposition: A | Discharge status: Skilled Nursing Facility | Payer: Self-pay | Source: Home / Self Care | Attending: Internal Medicine

## 2022-01-19 ENCOUNTER — Inpatient Hospital Stay (HOSPITAL_COMMUNITY): Payer: Medicare Other

## 2022-01-19 ENCOUNTER — Ambulatory Visit: Payer: Medicare Other | Admitting: Internal Medicine

## 2022-01-19 DIAGNOSIS — M5416 Radiculopathy, lumbar region: Secondary | ICD-10-CM | POA: Diagnosis not present

## 2022-01-19 HISTORY — PX: IR LUMBAR DISC ASPIRATION W/IMG GUIDE: IMG5306

## 2022-01-19 HISTORY — PX: RADIOLOGY WITH ANESTHESIA: SHX6223

## 2022-01-19 LAB — CBC WITH DIFFERENTIAL/PLATELET
Abs Immature Granulocytes: 0.08 10*3/uL — ABNORMAL HIGH (ref 0.00–0.07)
Basophils Absolute: 0 10*3/uL (ref 0.0–0.1)
Basophils Relative: 0 %
Eosinophils Absolute: 0.1 10*3/uL (ref 0.0–0.5)
Eosinophils Relative: 1 %
HCT: 34.3 % — ABNORMAL LOW (ref 39.0–52.0)
Hemoglobin: 10.3 g/dL — ABNORMAL LOW (ref 13.0–17.0)
Immature Granulocytes: 1 %
Lymphocytes Relative: 15 %
Lymphs Abs: 1.6 10*3/uL (ref 0.7–4.0)
MCH: 30 pg (ref 26.0–34.0)
MCHC: 30 g/dL (ref 30.0–36.0)
MCV: 100 fL (ref 80.0–100.0)
Monocytes Absolute: 0.5 10*3/uL (ref 0.1–1.0)
Monocytes Relative: 5 %
Neutro Abs: 8.1 10*3/uL — ABNORMAL HIGH (ref 1.7–7.7)
Neutrophils Relative %: 78 %
Platelets: 131 10*3/uL — ABNORMAL LOW (ref 150–400)
RBC: 3.43 MIL/uL — ABNORMAL LOW (ref 4.22–5.81)
RDW: 18.8 % — ABNORMAL HIGH (ref 11.5–15.5)
WBC: 10.3 10*3/uL (ref 4.0–10.5)
nRBC: 0.2 % (ref 0.0–0.2)

## 2022-01-19 LAB — BASIC METABOLIC PANEL
Anion gap: 17 — ABNORMAL HIGH (ref 5–15)
BUN: 27 mg/dL — ABNORMAL HIGH (ref 8–23)
CO2: 22 mmol/L (ref 22–32)
Calcium: 8.2 mg/dL — ABNORMAL LOW (ref 8.9–10.3)
Chloride: 95 mmol/L — ABNORMAL LOW (ref 98–111)
Creatinine, Ser: 2.82 mg/dL — ABNORMAL HIGH (ref 0.61–1.24)
GFR, Estimated: 24 mL/min — ABNORMAL LOW (ref >60)
Glucose, Bld: 168 mg/dL — ABNORMAL HIGH (ref 70–99)
Potassium: 4.3 mmol/L (ref 3.5–5.1)
Sodium: 134 mmol/L — ABNORMAL LOW (ref 135–145)

## 2022-01-19 LAB — SURGICAL PCR SCREEN
MRSA, PCR: NEGATIVE
Staphylococcus aureus: NEGATIVE

## 2022-01-19 LAB — CULTURE, BLOOD (ROUTINE X 2)
Culture: NO GROWTH
Culture: NO GROWTH
Special Requests: ADEQUATE

## 2022-01-19 LAB — GLUCOSE, CAPILLARY
Glucose-Capillary: 187 mg/dL — ABNORMAL HIGH (ref 70–99)
Glucose-Capillary: 155 mg/dL — ABNORMAL HIGH (ref 70–99)
Glucose-Capillary: 169 mg/dL — ABNORMAL HIGH (ref 70–99)
Glucose-Capillary: 203 mg/dL — ABNORMAL HIGH (ref 70–99)
Glucose-Capillary: 141 mg/dL — ABNORMAL HIGH (ref 70–99)

## 2022-01-19 SURGERY — IR WITH ANESTHESIA
Anesthesia: General

## 2022-01-19 MED ORDER — LIDOCAINE HCL 1 % IJ SOLN
INTRAMUSCULAR | Status: AC
  Filled 2022-01-19: qty 20

## 2022-01-19 MED ORDER — PHENYLEPHRINE 40 MCG/ML (10ML) SYRINGE FOR IV PUSH (FOR BLOOD PRESSURE SUPPORT)
PREFILLED_SYRINGE | INTRAVENOUS | Status: DC | PRN
  Administered 2022-01-19: 200 ug via INTRAVENOUS
  Administered 2022-01-19: 80 ug via INTRAVENOUS

## 2022-01-19 MED ORDER — SUGAMMADEX SODIUM 200 MG/2ML IV SOLN
INTRAVENOUS | Status: DC | PRN
  Administered 2022-01-19: 400 mg via INTRAVENOUS

## 2022-01-19 MED ORDER — SODIUM CHLORIDE 0.9 % IV SOLN: INTRAVENOUS | Status: DC

## 2022-01-19 MED ORDER — DOXYCYCLINE HYCLATE 100 MG PO TABS
100.0000 mg | ORAL_TABLET | Freq: Two times a day (BID) | ORAL | Status: DC
  Administered 2022-01-19 – 2022-02-14 (×49): 100 mg via ORAL
  Filled 2022-01-19 (×51): qty 1

## 2022-01-19 MED ORDER — AMOXICILLIN 500 MG PO CAPS
500.0000 mg | ORAL_CAPSULE | Freq: Every day | ORAL | Status: DC
  Administered 2022-01-19 – 2022-01-20 (×2): 500 mg via ORAL
  Filled 2022-01-19 (×3): qty 1

## 2022-01-19 MED ORDER — CHLORHEXIDINE GLUCONATE 0.12 % MT SOLN: 15.0000 mL | Freq: Once | OROMUCOSAL | Status: AC

## 2022-01-19 MED ORDER — VANCOMYCIN HCL IN DEXTROSE 1-5 GM/200ML-% IV SOLN
INTRAVENOUS | Status: AC
  Filled 2022-01-19: qty 200

## 2022-01-19 MED ORDER — ROCURONIUM BROMIDE 10 MG/ML (PF) SYRINGE
PREFILLED_SYRINGE | INTRAVENOUS | Status: DC | PRN
  Administered 2022-01-19: 70 mg via INTRAVENOUS

## 2022-01-19 MED ORDER — INSULIN ASPART 100 UNIT/ML IJ SOLN: 0.0000 [IU] | INTRAMUSCULAR | Status: DC | PRN

## 2022-01-19 MED ORDER — PROPOFOL 10 MG/ML IV BOLUS
INTRAVENOUS | Status: DC | PRN
  Administered 2022-01-19: 200 mg via INTRAVENOUS

## 2022-01-19 MED ORDER — SALINE SPRAY 0.65 % NA SOLN
1.0000 | NASAL | Status: DC | PRN
  Administered 2022-01-19 – 2022-01-27 (×2): 1 via NASAL
  Filled 2022-01-19 (×2): qty 44

## 2022-01-19 MED ORDER — ORAL CARE MOUTH RINSE: 15.0000 mL | Freq: Once | OROMUCOSAL | Status: AC

## 2022-01-19 MED ORDER — VANCOMYCIN HCL 1000 MG IV SOLR
INTRAVENOUS | Status: DC | PRN
  Administered 2022-01-19: 1000 mg via INTRAVENOUS

## 2022-01-19 MED ORDER — LIDOCAINE 2% (20 MG/ML) 5 ML SYRINGE
INTRAMUSCULAR | Status: DC | PRN
  Administered 2022-01-19: 100 mg via INTRAVENOUS

## 2022-01-19 MED ORDER — BUPIVACAINE HCL (PF) 0.5 % IJ SOLN
INTRAMUSCULAR | Status: AC
  Filled 2022-01-19: qty 30

## 2022-01-19 MED ORDER — CHLORHEXIDINE GLUCONATE 0.12 % MT SOLN
OROMUCOSAL | Status: AC
  Administered 2022-01-19: 15 mL via OROMUCOSAL
  Filled 2022-01-19: qty 15

## 2022-01-19 NOTE — Progress Notes (Signed)
[facility] Infectious Diseases Follow Up Note  Patient Identification: Patient Name: Alexander [name] MRN: [id number] [place] Date: 01/13/2022 12:41 PM Age: 61 y.o.Today's Date: 01/19/2022  Reason for Visit: concern for discitis   Principal Problem:   Lumbar back pain with radiculopathy affecting left lower extremity Active Problems:   Essential hypertension, benign   ESRD (end stage renal disease) (HCC)   PAF (paroxysmal atrial fibrillation) (HCC)   Pacemaker infection ([place])   Type 2 diabetes mellitus with chronic kidney disease, with long-term current use of insulin (HCC)   GERD without esophagitis   Pressure injury of skin   Ulcer of right leg, limited to breakdown of skin ([place])   Below-knee amputation of right lower extremity ([place])  Antibiotics: None    Lines/Hardware: RT arm AVF    Interval Events: Continues to be afebrile, no leukocytosis. Pending IR aspirate as procedure was aborted due to pain    Assessment #Progressive subacute fractures with progressive endplate degeneration of lumbar vertebrae versus partially treated discitis and osteomyelitis  #TV/PPM endocarditis-did prolonged course of vancomycin and cefepime with HD until 11/2721 started on p.o. amoxicillin and p.o. doxycycline for suppression indefinitely given high risk surgical candidate for device removal  #Ulceration at the right TMA and left TMA site -seen by orthopedics, plan for serial compression wraps and outpatient follow-up  #ESRD on HD via right arm AVF  Recommendations - He is planned for repeat aspiration of disc by IR with sedation today on talking to the patient - Hold off on abtx pending IR aspiration post which can be started on PO amoxicillin and PO doxycycline  - He is scheduled have Instrumented lumbar decompression with Dr Saintclair Halsted on 3/13 and would be ideal to have the aspiration done before surgery  - I also spoke with Dr Genene Churn from radiology  on 3/9 regarding his imaging of spine who reviewed it from 2012 up until 2023 and agrees that abnormal findings seen in the MRI seems to be related to be progressive degenerative changes which seems to have gotten worse from dec 2022 til [name]   Dr Linus Salmons on call this weekend and will follow cultures   Rest of the management as per the primary team. Thank you for the consult. Please page with pertinent questions or concerns.  ______________________________________________________________________ Subjective patient seen and examined at the bedside.  Having HD Back pain is still worse Tells me he is going to have repeat IR aspiration today under sedation   Vitals BP 120/73 (BP Location: Left Arm)    Pulse (!) 110    Temp 97.6 F (36.4 C) (Oral)    Resp (!) 26    Ht 6' (1.829 m)    Wt 93.1 kg    SpO2 90%    BMI 27.84 kg/m   Physical Exam Constitutional:  Lying in bed, not in acute distress    Comments: Having HD   Cardiovascular:     Rate and Rhythm: Normal rate and irregular rhythm      Heart sounds:  Pulmonary:     Effort: Pulmonary effort is normal on room air     Comments:    Abdominal:     Palpations: Abdomen is soft.     Tenderness: non distended and non tender    Musculoskeletal:        General: RT BLA bandaged and left foot TMA bandaged    Skin:    Comments: pictures in the chart reviewed    Neurological:  General: Left leg weakness and unable to move from the bed,awake, alert and oriented   Psychiatric:        Mood and Affect: Mood normal.   Pertinent Microbiology Results for orders placed or performed during the hospital encounter of 01/13/22  Resp Panel by RT-PCR (Flu A&B, Covid) Nasopharyngeal Swab     Status: None   Collection Time: 01/14/22  1:13 AM   Specimen: Nasopharyngeal Swab; Nasopharyngeal(NP) swabs in vial transport medium  Result Value Ref Range Status   SARS Coronavirus 2 by RT PCR NEGATIVE NEGATIVE Final    Comment:  (NOTE) SARS-CoV-2 target nucleic acids are NOT DETECTED.  The SARS-CoV-2 RNA is generally detectable in upper respiratory specimens during the acute phase of infection. The lowest concentration of SARS-CoV-2 viral copies this assay can detect is 138 copies/mL. A negative result does not preclude SARS-Cov-2 infection and should not be used as the sole basis for treatment or other patient management decisions. A negative result may occur with  improper specimen collection/handling, submission of specimen other than nasopharyngeal swab, presence of viral mutation(s) within the areas targeted by this assay, and inadequate number of viral copies(<138 copies/mL). A negative result must be combined with clinical observations, patient history, and epidemiological information. The expected result is Negative.  Fact Sheet for Patients:  EntrepreneurPulse.com.au  Fact Sheet for Healthcare Providers:  IncredibleEmployment.be  This test is no t yet approved or cleared by the [place] FDA and  has been authorized for detection and/or diagnosis of SARS-CoV-2 by FDA under an Emergency Use Authorization (EUA). This EUA will remain  in effect (meaning this test can be used) for the duration of the COVID-19 declaration under Section 564(b)(1) of the Act, 21 U.S.C.section 360bbb-3(b)(1), unless the authorization is terminated  or revoked sooner.       Influenza A by PCR NEGATIVE NEGATIVE Final   Influenza B by PCR NEGATIVE NEGATIVE Final    Comment: (NOTE) The Xpert Xpress SARS-CoV-2/FLU/RSV plus assay is intended as an aid in the diagnosis of influenza from Nasopharyngeal swab specimens and should not be used as a sole basis for treatment. Nasal washings and aspirates are unacceptable for Xpert Xpress SARS-CoV-2/FLU/RSV testing.  Fact Sheet for Patients: EntrepreneurPulse.com.au  Fact Sheet for Healthcare  Providers: IncredibleEmployment.be  This test is not yet approved or cleared by the [place] FDA and has been authorized for detection and/or diagnosis of SARS-CoV-2 by FDA under an Emergency Use Authorization (EUA). This EUA will remain in effect (meaning this test can be used) for the duration of the COVID-19 declaration under Section 564(b)(1) of the Act, 21 U.S.C. section 360bbb-3(b)(1), unless the authorization is terminated or revoked.  Performed at [facility], [place] [address] [place], [place] [address]   MRSA Next Gen by PCR, Nasal     Status: None   Collection Time: 01/14/22  7:18 AM   Specimen: Nasal Mucosa; Nasal Swab  Result Value Ref Range Status   MRSA by PCR Next Gen NOT DETECTED NOT DETECTED Final    Comment: (NOTE) The GeneXpert MRSA Assay (FDA approved for NASAL specimens only), is one component of a comprehensive MRSA colonization surveillance program. It is not intended to diagnose MRSA infection nor to guide or monitor treatment for MRSA infections. Test performance is not FDA approved in patients less than 94 years old. Performed at [facility], [place] [address] [place],  [address]   Culture, blood (routine x 2)     Status: None (Preliminary  result)   Collection Time: 01/14/22  3:24 PM   Specimen: BLOOD LEFT WRIST  Result Value Ref Range Status   Specimen Description BLOOD LEFT WRIST  Final   Special Requests   Final    BOTTLES DRAWN AEROBIC AND ANAEROBIC Blood Culture results may not be optimal due to an inadequate volume of blood received in culture bottles   Culture   Final    NO GROWTH 4 DAYS Performed at [facility], [place] [address] [place], [place] [address]    Report Status PENDING  Incomplete  Culture, blood (routine x 2)     Status: None (Preliminary result)   Collection Time: 01/14/22  3:33 PM   Specimen: BLOOD LEFT FOREARM  Result Value Ref Range Status   Specimen Description BLOOD LEFT  FOREARM  Final   Special Requests   Final    BOTTLES DRAWN AEROBIC AND ANAEROBIC Blood Culture adequate volume   Culture   Final    NO GROWTH 4 DAYS Performed at [facility], [place] [address] [place], [place] [address]    Report Status PENDING  Incomplete   Pertinent Lab. CBC Latest Ref Rng & Units 01/18/2022 01/17/2022 01/16/2022  WBC 4.0 - 10.5 K/uL 9.5 9.2 7.5  Hemoglobin 13.0 - 17.0 g/dL 9.9(L) 9.5(L) 9.8(L)  Hematocrit 39.0 - 52.0 % 31.8(L) 30.4(L) 30.8(L)  Platelets 150 - 400 K/uL 121(L) 109(L) 92(L)   CMP Latest Ref Rng & Units 01/18/2022 01/17/2022 01/16/2022  Glucose 70 - 99 mg/dL 202(H) 241(H) 181(H)  BUN 8 - 23 mg/dL 39(H) 27(H) 34(H)  Creatinine 0.61 - 1.24 mg/dL 3.56(H) 2.92(H) 3.99(H)  Sodium 135 - 145 mmol/L 133(L) 133(L) 130(L)  Potassium 3.5 - 5.1 mmol/L 5.1 3.9 4.3  Chloride 98 - 111 mmol/L 97(L) 98 95(L)  CO2 22 - 32 mmol/L '25 25 24  '$ Calcium 8.9 - 10.3 mg/dL 8.5(L) 8.3(L) 8.5(L)  Total Protein 6.5 - 8.1 g/dL - - -  Total Bilirubin 0.3 - 1.2 mg/dL - - -  Alkaline Phos 38 - 126 U/L - - -  AST 15 - 41 U/L - - -  ALT 0 - 44 U/L - - -     Pertinent Imaging today Plain films and CT images have been personally visualized and interpreted; radiology reports have been reviewed. Decision making incorporated into the Impression / Recommendations.  No results found.  I spent more than 35 minutes for this patient encounter including review of prior medical records, coordination of care with primary/other specialist with greater than 50% of time being face to face/counseling and discussing diagnostics/treatment plan with the patient/family.  Electronically signed by:   Rosiland Oz, MD Infectious Disease Physician [facility] for Infectious Disease Pager: [phone]

## 2022-01-19 NOTE — Progress Notes (Signed)
? ? ? ?  Referral received for Alexander [name] for goals of care discussion. Chart reviewed and updates received from RN. Patient out of the room for procedure in IR under general anesthesia. Due to anesthesia effects complicating goals of care discussions will re-attempt initial consult tomorrow.  ? ?PMT will re-attempt to contact family at a later time/date. Detailed note and recommendations to follow once GOC has been completed.  ? ?Thank you for your referral and allowing PMT to assist in [place] care.  ? ?Walden Field, NP ?Palliative Medicine Team ?Phone: [phone] ? ?NO CHARGE   ?

## 2022-01-19 NOTE — Progress Notes (Signed)
PROGRESS NOTE    Alexander [name]  MNO:[id number] DOB: 17-Feb-1961 DOA: 01/13/2022 PCP: [name], MD    Brief Narrative:  Alexander [name] is a 61 y.o. male with medical history significant for ESRD on HD on TTS, HTN, HLD, PVD status post right BKA and left midfoot amputation, Afib on Eliquis, CAD, DM2, and status post pacemaker, presented to the ER with worsening intractable low back pain over the last couple of days with associated left lower extremity radiation and radiculopathy with associated spasm and weakness, denies any urinary or stool incontinence. He has been having chronic low back pain with occasional radiation to both lower extremities. Pt has been followed by spine surgery and was scheduled for LS spine MRI on 02/07/2022.  Plan is for disc aspiration and if no infection then patient will have surgical decompression.  Disc aspiration was held due to pain. Will have to have with anesthesia    Assessment and Plan: * Lumbar back pain with radiculopathy affecting left lower extremity CRP, ESR elevated CT noted moderate neuroforaminal stenosis at L2-L3 and L5-S1 MRI lumbar spine showed subacute fractures L2-L3 disc, partially treated discitis/osteomyelitis is a consideration, severe multifactorial spinal stenosis at L2-3 Followed by spine surgery Dr Saintclair Halsted, rec disc aspiration by IR to rule out any discitis and plan for surgical decompression IR consulted for disc aspiration to rule out any infection,- due to pain will have to be done with anesthesia on 3/10 Completed IV decadron Pain management as able- appeared to improve with robaxin PT/OT- recommending SNF  ESRD (end stage renal disease) ([place]) HD- T/T/S Nephrology consulted  PAF (paroxysmal atrial fibrillation) ([place]) Continue Lopressor and hold Eliquis due to upcoming procedure  Essential hypertension, benign Continue Lopressor  Dyslipidemia - We will continue statin therapy.  Type 2 diabetes mellitus with chronic  kidney disease, with long-term current use of insulin (HCC) Last A1c 6.8 SSI, NovoLog 70/30 twice daily, hypoglycemic protocol, Accu-Cheks  GERD without esophagitis Continue PPI therapy.  Below-knee amputation of right lower extremity ([place]) At outpatient visit on 01/10/2022 with orthopedics, noted BLE with significant weeping, edema with induration, both noted with distal maceration with L LE noted open ulceration Concern for ischemic changes, placed in Unna and Dynaflex compression wraps bilaterally Consulted [name], we will continue with serial compression wraps and outpatient follow-up  Pressure injury of skin Pressure Injury 01/14/22 Ischial tuberosity Right Stage 3 -  Full thickness tissue loss. Subcutaneous fat may be visible but bone, tendon or muscle are NOT exposed. (Active)  01/14/22 0321  Location: Ischial tuberosity  Location Orientation: Right  Staging: Stage 3 -  Full thickness tissue loss. Subcutaneous fat may be visible but bone, tendon or muscle are NOT exposed.  Wound Description (Comments):   Present on Admission: Yes     Pressure Injury 01/14/22 Buttocks Right Stage 2 -  Partial thickness loss of dermis presenting as a shallow open injury with a red, pink wound bed without slough. (Active)  01/14/22 0322  Location: Buttocks  Location Orientation: Right  Staging: Stage 2 -  Partial thickness loss of dermis presenting as a shallow open injury with a red, pink wound bed without slough.  Wound Description (Comments):   Present on Admission: Yes     Pressure Injury 01/14/22 Sacrum Mid Stage 2 -  Partial thickness loss of dermis presenting as a shallow open injury with a red, pink wound bed without slough. (Active)  01/14/22 0322  Location: Sacrum  Location Orientation: Mid  Staging: Stage  2 -  Partial thickness loss of dermis presenting as a shallow open injury with a red, pink wound bed without slough.  Wound Description (Comments):   Present on Admission: Yes      Pressure Injury 01/14/22 Buttocks Left Stage 2 -  Partial thickness loss of dermis presenting as a shallow open injury with a red, pink wound bed without slough. (Active)  01/14/22 0323  Location: Buttocks  Location Orientation: Left  Staging: Stage 2 -  Partial thickness loss of dermis presenting as a shallow open injury with a red, pink wound bed without slough.  Wound Description (Comments):   Present on Admission: Yes     Pressure Injury 01/14/22 Buttocks Posterior;Proximal;Right Stage 2 -  Partial thickness loss of dermis presenting as a shallow open injury with a red, pink wound bed without slough. (Active)  01/14/22 0324  Location: Buttocks  Location Orientation: Posterior;Proximal;Right  Staging: Stage 2 -  Partial thickness loss of dermis presenting as a shallow open injury with a red, pink wound bed without slough.  Wound Description (Comments):   Present on Admission: Yes       Pacemaker infection ([place]) Follows with ID outpatient -apprecaited ID consult-- will plan to resume abx after aspiration -last progress note: Plan to transition to doxy/amoxicillin 100 mg po bid and 500 mg po qhs -- indefinitely -spoke with Dr. [name]-- will get limited echo to assess treatment       Will also get palliative care consult after discussion with Dr. [name] for [place]   DVT prophylaxis:     Code Status: Full Code Family Communication:   Disposition Plan:  Level of care: Med-Surg Status is: Inpatient Remains inpatient appropriate because: needs surgery    Consultants:  NS Palliative care renal   Subjective: Off floor with IR s/p HD this AM  Objective: Vitals:   01/19/22 1030 01/19/22 1100 01/19/22 1146 01/19/22 1300  BP: 124/65 123/72 126/65 126/69  Pulse: 63 75 100 (!) 115  Resp:   15 (!) 26  Temp:   (!) 97.5 F (36.4 C) 97.8 F (36.6 C)  TempSrc:   Oral Oral  SpO2:   98%   Weight:      Height:        Intake/Output Summary (Last 24 hours) at 01/19/2022  1347 Last data filed at 01/19/2022 1146 Gross per 24 hour  Intake 220 ml  Output 1809 ml  Net -1589 ml   Filed Weights   01/16/22 1220 01/16/22 1558 01/19/22 0740  Weight: 96.1 kg 94 kg 93.1 kg    Examination:  Off floor x 2    Data Reviewed: I have personally reviewed following labs and imaging studies  CBC: Recent Labs  Lab 01/13/22 1505 01/14/22 0415 01/15/22 0342 01/16/22 0333 01/17/22 0232 01/18/22 0337  WBC 9.2 12.8* 9.9 7.5 9.2 9.5  NEUTROABS 6.7  --  7.0 6.1 7.4 6.2  HGB 10.9* 11.0* 9.5* 9.8* 9.5* 9.9*  HCT 36.0* 35.2* 30.7* 30.8* 30.4* 31.8*  MCV 100.6* 100.0 99.4 97.8 98.1 100.3*  PLT PLATELET CLUMPS NOTED ON SMEAR, UNABLE TO ESTIMATE 104* 99* 92* 109* 366*   Basic Metabolic Panel: Recent Labs  Lab 01/13/22 1505 01/14/22 0415 01/15/22 0342 01/16/22 0333 01/17/22 0232 01/18/22 0337 01/19/22 1227  NA 135   < > 133* 130* 133* 133* 134*  K 3.7   < > 4.1 4.3 3.9 5.1 4.3  CL 96*   < > 97* 95* 98 97* 95*  CO2 28   < >  '25 24 25 25 22  ' GLUCOSE 157*   < > 145* 181* 241* 202* 168*  BUN 14   < > 24* 34* 27* 39* 27*  CREATININE 2.03*   < > 3.30* 3.99* 2.92* 3.56* 2.82*  CALCIUM 8.4*   < > 8.4* 8.5* 8.3* 8.5* 8.2*  MG 1.7  --   --   --   --   --   --   PHOS  --   --   --  4.9*  --  4.9*  --    < > = values in this interval not displayed.   GFR: Estimated Creatinine Clearance: 29.4 mL/min (A) (by C-G formula based on SCr of 2.82 mg/dL (H)). Liver Function Tests: Recent Labs  Lab 01/13/22 1505  AST 25  ALT 19  ALKPHOS 91  BILITOT 0.9  PROT 5.5*  ALBUMIN 2.4*   No results for input(s): LIPASE, AMYLASE in the last 168 hours. No results for input(s): AMMONIA in the last 168 hours. Coagulation Profile: Recent Labs  Lab 01/17/22 0232 01/18/22 0337  INR 1.9* 1.4*   Cardiac Enzymes: No results for input(s): CKTOTAL, CKMB, CKMBINDEX, TROPONINI in the last 168 hours. BNP (last 3 results) No results for input(s): PROBNP in the last 8760  hours. HbA1C: No results for input(s): HGBA1C in the last 72 hours. CBG: Recent Labs  Lab 01/18/22 1149 01/18/22 1633 01/18/22 2101 01/19/22 0645 01/19/22 1205  GLUCAP 129* 137* 216* 187* 155*   Lipid Profile: No results for input(s): CHOL, HDL, LDLCALC, TRIG, CHOLHDL, LDLDIRECT in the last 72 hours. Thyroid Function Tests: No results for input(s): TSH, T4TOTAL, FREET4, T3FREE, THYROIDAB in the last 72 hours. Anemia Panel: No results for input(s): VITAMINB12, FOLATE, FERRITIN, TIBC, IRON, RETICCTPCT in the last 72 hours. Sepsis Labs: No results for input(s): PROCALCITON, LATICACIDVEN in the last 168 hours.  Recent Results (from the past 240 hour(s))  Resp Panel by RT-PCR (Flu A&B, Covid) Nasopharyngeal Swab     Status: None   Collection Time: 01/14/22  1:13 AM   Specimen: Nasopharyngeal Swab; Nasopharyngeal(NP) swabs in vial transport medium  Result Value Ref Range Status   SARS Coronavirus 2 by RT PCR NEGATIVE NEGATIVE Final    Comment: (NOTE) SARS-CoV-2 target nucleic acids are NOT DETECTED.  The SARS-CoV-2 RNA is generally detectable in upper respiratory specimens during the acute phase of infection. The lowest concentration of SARS-CoV-2 viral copies this assay can detect is 138 copies/mL. A negative result does not preclude SARS-Cov-2 infection and should not be used as the sole basis for treatment or other patient management decisions. A negative result may occur with  improper specimen collection/handling, submission of specimen other than nasopharyngeal swab, presence of viral mutation(s) within the areas targeted by this assay, and inadequate number of viral copies(<138 copies/mL). A negative result must be combined with clinical observations, patient history, and epidemiological information. The expected result is Negative.  Fact Sheet for Patients:  EntrepreneurPulse.com.au  Fact Sheet for Healthcare Providers:   IncredibleEmployment.be  This test is no t yet approved or cleared by the [place] FDA and  has been authorized for detection and/or diagnosis of SARS-CoV-2 by FDA under an Emergency Use Authorization (EUA). This EUA will remain  in effect (meaning this test can be used) for the duration of the COVID-19 declaration under Section 564(b)(1) of the Act, 21 U.S.C.section 360bbb-3(b)(1), unless the authorization is terminated  or revoked sooner.       Influenza A by PCR NEGATIVE NEGATIVE Final  Influenza B by PCR NEGATIVE NEGATIVE Final    Comment: (NOTE) The Xpert Xpress SARS-CoV-2/FLU/RSV plus assay is intended as an aid in the diagnosis of influenza from Nasopharyngeal swab specimens and should not be used as a sole basis for treatment. Nasal washings and aspirates are unacceptable for Xpert Xpress SARS-CoV-2/FLU/RSV testing.  Fact Sheet for Patients: EntrepreneurPulse.com.au  Fact Sheet for Healthcare Providers: IncredibleEmployment.be  This test is not yet approved or cleared by the [place] FDA and has been authorized for detection and/or diagnosis of SARS-CoV-2 by FDA under an Emergency Use Authorization (EUA). This EUA will remain in effect (meaning this test can be used) for the duration of the COVID-19 declaration under Section 564(b)(1) of the Act, 21 U.S.C. section 360bbb-3(b)(1), unless the authorization is terminated or revoked.  Performed at [facility], [place] [address] [place], [place] [address]   MRSA Next Gen by PCR, Nasal     Status: None   Collection Time: 01/14/22  7:18 AM   Specimen: Nasal Mucosa; Nasal Swab  Result Value Ref Range Status   MRSA by PCR Next Gen NOT DETECTED NOT DETECTED Final    Comment: (NOTE) The GeneXpert MRSA Assay (FDA approved for NASAL specimens only), is one component of a comprehensive MRSA colonization surveillance program. It is not intended to diagnose  MRSA infection nor to guide or monitor treatment for MRSA infections. Test performance is not FDA approved in patients less than 5 years old. Performed at [facility], [place] [address] [place], [place] [address]   Culture, blood (routine x 2)     Status: None (Preliminary result)   Collection Time: 01/14/22  3:24 PM   Specimen: BLOOD LEFT WRIST  Result Value Ref Range Status   Specimen Description BLOOD LEFT WRIST  Final   Special Requests   Final    BOTTLES DRAWN AEROBIC AND ANAEROBIC Blood Culture results may not be optimal due to an inadequate volume of blood received in culture bottles   Culture   Final    NO GROWTH 4 DAYS Performed at [facility], [place] [address], [place], [place] [address]    Report Status PENDING  Incomplete  Culture, blood (routine x 2)     Status: None (Preliminary result)   Collection Time: 01/14/22  3:33 PM   Specimen: BLOOD LEFT FOREARM  Result Value Ref Range Status   Specimen Description BLOOD LEFT FOREARM  Final   Special Requests   Final    BOTTLES DRAWN AEROBIC AND ANAEROBIC Blood Culture adequate volume   Culture   Final    NO GROWTH 4 DAYS Performed at [facility], [place] [address] [place], [place] [address]    Report Status PENDING  Incomplete         Radiology Studies: No results found.      Scheduled Meds:  [MAR Hold] atorvastatin  20 mg Oral QHS   [MAR Hold] calcitRIOL  0.25 mcg Oral Daily   [MAR Hold] Chlorhexidine Gluconate Cloth  6 each Topical Daily   [MAR Hold] furosemide  80 mg Oral Once per day on Sun Mon Wed Fri   [facility] Hold] insulin aspart  0-6 Units Subcutaneous TID WC   [MAR Hold] insulin aspart protamine- aspart  16 Units Subcutaneous BID WC   [MAR Hold] lidocaine  1 patch Transdermal Q24H   [MAR Hold] metoprolol tartrate  25 mg Oral BID   [MAR Hold] multivitamin with minerals  1 tablet Oral Once per  day on Mon Thu   [facility] Hold] pantoprazole  40 mg Oral Daily   Continuous Infusions:  sodium  chloride 10 mL/hr at 01/19/22 1308     LOS: 6 days    Time spent: 75 minutes spent on chart review, discussion with nursing staff, consultants, updating family and interview/physical exam; more than 50% of that time was spent in counseling and/or coordination of care.    Geradine Girt, DO [facility] Available via Epic secure chat 7am-7pm After these hours, please refer to coverage provider listed on [email or web address] 01/19/2022, 1:47 PM

## 2022-01-19 NOTE — Progress Notes (Signed)
?[facility] ?Progress Note  ? ?Subjective: Disc aspiration canceled 01/18/2022 D/T intractable pain. Rescheduled for today. On HD off schedule.Tolerating HD without issues.   ? ?Objective ?Vitals:  ? 01/19/22 0830 01/19/22 0900 01/19/22 0930 01/19/22 1000  ?BP: (!) 127/105 109/69 117/70 134/68  ?Pulse: 91 99 100 70  ?Resp: 15     ?Temp:      ?TempSrc:      ?SpO2: 99%     ?Weight:      ?Height:      ? ?Physical Exam ?General: Chronically ill appearing male in NAD.  ?Heart: S1,S2 RRR. No M/R/G ?Lungs: CTAB ?Abdomen: obese, NT. NABS ?Extremities:BLE wrapped in una boots.  Truly cannot assess edema. R BKA, L toe amps.  ?Dialysis Access: R AVF cannulated ? ? ? ?Additional Objective ?Labs: ?Basic Metabolic Panel: ?Recent Labs  ?Lab 01/16/22 ?0333 01/17/22 ?0232 01/18/22 ?1749  ?NA 130* 133* 133*  ?K 4.3 3.9 5.1  ?CL 95* 98 97*  ?CO2 '24 25 25  '$ ?GLUCOSE 181* 241* 202*  ?BUN 34* 27* 39*  ?CREATININE 3.99* 2.92* 3.56*  ?CALCIUM 8.5* 8.3* 8.5*  ?PHOS 4.9*  --  4.9*  ? ?Liver Function Tests: ?Recent Labs  ?Lab 01/13/22 ?1505  ?AST 25  ?ALT 19  ?ALKPHOS 91  ?BILITOT 0.9  ?PROT 5.5*  ?ALBUMIN 2.4*  ? ?No results for input(s): LIPASE, AMYLASE in the last 168 hours. ?CBC: ?Recent Labs  ?Lab 01/14/22 ?4496 01/15/22 ?7591 01/16/22 ?6384 01/17/22 ?6659 01/18/22 ?9357  ?WBC 12.8* 9.9 7.5 9.2 9.5  ?NEUTROABS  --  7.0 6.1 7.4 6.2  ?HGB 11.0* 9.5* 9.8* 9.5* 9.9*  ?HCT 35.2* 30.7* 30.8* 30.4* 31.8*  ?MCV 100.0 99.4 97.8 98.1 100.3*  ?PLT 104* 99* 92* 109* 121*  ? ?Blood Culture ?   ?Component Value Date/Time  ? SDES BLOOD LEFT FOREARM 01/14/2022 1533  ? SPECREQUEST  01/14/2022 1533  ?  BOTTLES DRAWN AEROBIC AND ANAEROBIC Blood Culture adequate volume  ? CULT  01/14/2022 1533  ?  NO GROWTH 4 DAYS ?Performed at [facility], [place] [address], [place], [place] [address] ?  ? REPTSTATUS PENDING 01/14/2022 1533  ? ? ?Cardiac Enzymes: ?No results for input(s): CKTOTAL, CKMB, CKMBINDEX, TROPONINI in the last 168  hours. ?CBG: ?Recent Labs  ?Lab 01/18/22 ?0735 01/18/22 ?1149 01/18/22 ?1633 01/18/22 ?2101 01/19/22 ?0645  ?GLUCAP 154* 129* 137* 216* 187*  ? ?Iron Studies: No results for input(s): IRON, TIBC, TRANSFERRIN, FERRITIN in the last 72 hours. ?'@lablastinr3'$ @ ?Studies/Results: ?No results found. ?Medications: ? sodium chloride    ? sodium chloride    ? ? atorvastatin  20 mg Oral QHS  ? calcitRIOL  0.25 mcg Oral Daily  ? Chlorhexidine Gluconate Cloth  6 each Topical Daily  ? furosemide  80 mg Oral Once per day on Sun Mon Wed Fri  ? insulin aspart  0-6 Units Subcutaneous TID WC  ? insulin aspart protamine- aspart  16 Units Subcutaneous BID WC  ? lidocaine  1 patch Transdermal Q24H  ? metoprolol tartrate  25 mg Oral BID  ? multivitamin with minerals  1 tablet Oral Once per day on Mon Thu  ? pantoprazole  40 mg Oral Daily  ? ? ? ?Dialysis Orders: ?TTS - GOC ?4hrs30mn, BFR 400, DFR 800,  EDW 94kg, 3K/ 2.5Ca ?No Systemic Heparin ?Mircera 200 mcg q2wks - last 01/11/22 ?Calcitriol 0.267m PO qHD - last 01/13/22 ?  ?Assessment/Plan: ?1 Lumbar back pain/Radiculopathy: Followed by Spine Surgery Dr. CrSaintclair HalstedMR Lumbar Spine  completed: showed fractures L2-L3 with fluid-? Trauma vs. Infection. Reviewed Dr. Windy Carina last note: recommend surgical decompression. IR consulted-plan for L2-3 disc aspiration, procedure aborted 01/18/2022. Continue pain management and Pt/OT ?2 ESRD - on HD TTS. HD today off schedule D/T census and staffing. Short HD 01/20/2022 to get back on T,Th,S schedule.  ?3  Hypertension/volume  - Currently not volume overloaded. Bps stable ?4. Anemia of CKD - Hgb 9.9. ESA not due yet ?5. Secondary Hyperparathyroidism - PO4 at goal. Continue VDRA. ?6. Nutrition - Renal diet on fluid restriction ?  ? ?Kyndal Heringer H. Tatum Massman NP-C ?01/19/2022, 10:20 AM  ?[facility] ?[phone] ? ? ?  ? ?

## 2022-01-19 NOTE — Progress Notes (Signed)
PT Cancellation Note ? ?Patient Details ?Name: Alexander [name] ?MRN: [id number] ?DOB: Nov 05, 1961 ? ? ?Cancelled Treatment:    Reason Eval/Treat Not Completed: Patient at procedure or test/unavailable Attempt x 2. First attempt, patient off unit at HD. On second attempt, patient having procedure at 1300 per RN for aspiration. Will follow up at later date as patient currently refusing mobility until spinal surgery is complete.  ? ?Kash Davie A. Gilford Rile, PT, DPT ?Acute Rehabilitation Services ?Pager [phone] ?Office [phone] ? ? ? ?Jayelyn Barno A Hildegard Hlavac ?01/19/2022, 12:42 PM ?

## 2022-01-19 NOTE — Procedures (Signed)
INTERVENTIONAL NEURORADIOLOGY BRIEF POSTPROCEDURE NOTE ? ?FLUOROSCOPY GUIDED L2-3 DISC FINE-NEEDLE ASPIRATION  ?  ?Attending: Dr. Pedro Earls ?  ?Assistant: None. ?  ?Diagnosis: L2-3 discitis/osteomyelitis  ?  ?Access site: Percutaneous  ?  ?Anesthesia: General anesthesia.  ?  ?Medication used: Refer to anesthesia documentation. ?  ?Complications: None  ?  ?Estimated blood loss: None  ?  ?Specimen: 2 fine-needle aspiration samples sent for microbiology analysis.  ?  ?Fluoroscopy guided L2-3 disc aspiration performed without incident or complication.  ?  ?The patient tolerated the procedure well and is in stable condition.  ? ?  ?

## 2022-01-19 NOTE — H&P (Signed)
Chief Complaint: Updating H&P for L-3 disc aspiration under anesthesia  Referring Physician(s): Lesia Sago   Supervising Physician: Pedro Earls  Patient Status: [facility] - In-pt  History of Present Illness: Alexander [name] is a 61 y.o. male with medical issues including hypertension, diabetes, ESRD on hemodialysis, A fib on Eliquis, CAD, and s/p pacemaker placement.  He who presented to [facility] ED on 3/4  with onset of intractable low back pain over the last couple of days with associated left lower extremity radiation and radiculopathy with associated spasm and weakness. MRI on 01/15/22 showed:  1. As seen on recent CT, there are progressive endplate deformities adjacent to the L2-3 disc with associated nonspecific discal hyperintensity. Based on previous studies, findings may reflect progressive subacute fractures with progressive endplate degeneration. Partially treated discitis/osteomyelitis is a consideration, although there is no significant bone marrow edema or paraspinal fluid collection. 2. Associated disc degeneration with annular disc bulging, facet and ligamentous hypertrophy contributing to severe multifactorial spinal stenosis and moderate narrowing of the lateral recesses and foramina bilaterally. 3. Previous posterior decompression at L3-4 and L4-5 without significant recurrent spinal stenosis. 4. Chronic moderate foraminal narrowing bilaterally at L5-S1 due to disc bulging and endplate osteophytes.   Dr. Saintclair Halsted recommended L2-3 disc aspiration before proceeding to anticipated back surgery.    He came to IR yesterday for the procedure but was in so much pain he could not even move from the bed to the table.  He will need to have anesthesia for the procedure. He is NPO.  He continues to c/o severe back pain. No nausea/vomiting. No Fever/chills. ROS negative.   Past Medical History:  Diagnosis Date   Acute blood loss anemia    Alcohol abuse     Anemia    Anemia of chronic disease    Arthritis    "patient does not think so."   Atrial fibrillation ([place])    Cardiac syncope 10/07/2014   rapid A fib with 8 sec pauses on converison with syncope- temp pacing wire placed then PPM   Cataract    BILATERAL   Coronary artery disease    Diabetes mellitus    dx---been  awhile   Elevated LFTs    Endocarditis of tricuspid valve    ESRD (end stage renal disease) ([place])    [place]   GERD (gastroesophageal reflux disease)    History of blood transfusion    Hyperlipemia    Hypertension    Malnutrition ([place])    Osteomyelitis ([place])    right foot   Peripheral vascular disease ([place])    Presence of permanent cardiac pacemaker 10/08/2014   [employer]   Tricuspid valve vegetation     Past Surgical History:  Procedure Laterality Date   A/V FISTULAGRAM Right 05/01/2021   Procedure: A/V FISTULAGRAM;  Surgeon: Waynetta Sandy, MD;  Location: [place] [facility];  Service: Cardiovascular;  Laterality: Right;  failed fistulagram   ABDOMINAL AORTOGRAM W/LOWER EXTREMITY N/A 09/09/2017   Procedure: ABDOMINAL AORTOGRAM W/LOWER EXTREMITY;  Surgeon: Angelia Mould, MD;  Location: [place] [facility];  Service: Cardiovascular;  Laterality: N/A;   ABDOMINAL AORTOGRAM W/LOWER EXTREMITY Right 11/11/2019   Procedure: ABDOMINAL AORTOGRAM W/LOWER EXTREMITY;  Surgeon: Marty Heck, MD;  Location: [place] [facility];  Service: Cardiovascular;  Laterality: Right;   AMPUTATION Left 08/19/2013   Procedure: AMPUTATION RAY;  Surgeon: Alta Corning, MD;  Location: [place];  Service: Orthopedics;  Laterality: Left;  ray amputation left 5th   AMPUTATION Left 10/27/2013   Procedure: AMPUTATION DIGIT-LEFT 4TH TOE, 4th and 5th metatarsal.;  Surgeon: Angelia Mould, MD;  Location: [place];  Service: Vascular;  Laterality: Left;   AMPUTATION Left 11/04/2013   Procedure: LEFT FOOT TRANS-METATARSAL AMPUTATION WITH WOUND CLOSURE ;   Surgeon: Alta Corning, MD;  Location: [place];  Service: Orthopedics;  Laterality: Left;   AMPUTATION Right 09/10/2017   Procedure: RIGHT FOURTH AND FIFTH TOE AMPUTATION;  Surgeon: Angelia Mould, MD;  Location: [place];  Service: Vascular;  Laterality: Right;   AMPUTATION Right 02/19/2020   Procedure: AMPUTATION RIGHT 3RD TOE;  Surgeon: Angelia Mould, MD;  Location: [place];  Service: Vascular;  Laterality: Right;   AMPUTATION Right 03/11/2020   Procedure: RIGHT BELOW KNEE AMPUTATION;  Surgeon: Newt Minion, MD;  Location: [place];  Service: Orthopedics;  Laterality: Right;   APPLICATION OF WOUND VAC Right 02/19/2020   Procedure: Application Of Wound Vac Right foot;  Surgeon: Angelia Mould, MD;  Location: [place];  Service: Vascular;  Laterality: Right;   AV FISTULA PLACEMENT Right 03/27/2021   Procedure: RIGHT ARM ARTERIOVENOUS (AV) FISTULA;  Surgeon: Angelia Mould, MD;  Location: [place];  Service: Vascular;  Laterality: Right;   AV FISTULA PLACEMENT Right 05/10/2021   Procedure: RIGHT ARM ARTERIOVENOUS (AV) FISTULA;  Surgeon: Waynetta Sandy, MD;  Location: [place];  Service: Vascular;  Laterality: Right;   [place] Right 08/11/2021   Procedure: RIGHT ARM SECOND STAGE BASILIC VEIN TRANSPOSITION;  Surgeon: Waynetta Sandy, MD;  Location: [place];  Service: Vascular;  Laterality: Right;   BIOPSY  07/20/2020   Procedure: BIOPSY;  Surgeon: Irving Copas., MD;  Location: [place];  Service: Gastroenterology;;   CARDIAC CATHETERIZATION  10/07/2014   Procedure: LEFT HEART CATH AND CORONARY ANGIOGRAPHY;  Surgeon: Laverda Page, MD;  Location: [facility] CATH LAB;  Service: Cardiovascular;;   COLONOSCOPY W/ POLYPECTOMY     COLONOSCOPY WITH PROPOFOL N/A 05/25/2020   Procedure: COLONOSCOPY WITH PROPOFOL;  Surgeon: Gatha Mayer, MD;  Location: [facility];  Service: Endoscopy;  Laterality: N/A;   EMBOLECTOMY  Right 09/12/2017   Procedure: Thrombectomy  & Redo Right Below Knee Popliteal Artery Bypass Graft  ;  Surgeon: Waynetta Sandy, MD;  Location: [facility] OR;  Service: Vascular;  Laterality: Right;   ENTEROSCOPY N/A 07/20/2020   Procedure: ENTEROSCOPY;  Surgeon: Rush Landmark Telford Nab., MD;  Location: [place];  Service: Gastroenterology;  Laterality: N/A;   ESOPHAGOGASTRODUODENOSCOPY (EGD) WITH PROPOFOL N/A 05/24/2020   Procedure: ESOPHAGOGASTRODUODENOSCOPY (EGD) WITH PROPOFOL;  Surgeon: Ladene Artist, MD;  Location: [facility];  Service: Endoscopy;  Laterality: N/A;   EYE SURGERY Bilateral    cataract   FEMORAL-TIBIAL BYPASS GRAFT Left 10/27/2013   Procedure: BYPASS GRAFT LEFT FEMORAL- POSTERIOR TIBIAL ARTERY;  Surgeon: Angelia Mould, MD;  Location: [place];  Service: Vascular;  Laterality: Left;   FEMORAL-TIBIAL BYPASS GRAFT Right 09/10/2017   Procedure: RIGHT SUPERFICIAL  FEMORAL ARTERY-BELOW KNEE POPLITEAL ARTERY BYPASS GRAFT WITH VEIN;  Surgeon: Angelia Mould, MD;  Location: [place];  Service: Vascular;  Laterality: Right;   GIVENS CAPSULE STUDY N/A 07/20/2020   Procedure: GIVENS CAPSULE STUDY;  Surgeon: Irving Copas., MD;  Location: [place];  Service: Gastroenterology;  Laterality: N/A;  capsule placed through scope into duodenum @ 0936   I & D EXTREMITY Left 10/27/2013   Procedure: IRRIGATION  AND DEBRIDEMENT EXTREMITY- LEFT FOOT;  Surgeon: Angelia Mould, MD;  Location: [place];  Service: Vascular;  Laterality: Left;   I & D EXTREMITY Right 01/26/2020   Procedure: IRRIGATION AND DEBRIDEMENT EXTREMITY RIGHT FOOT WOUND;  Surgeon: Angelia Mould, MD;  Location: [place];  Service: Vascular;  Laterality: Right;   INSERT / REPLACE / REMOVE PACEMAKER     INSERTION OF DIALYSIS CATHETER Right 11/03/2021   Procedure: RIGHT INTERNAL JUGULAR INSERTION OF TUNNELED DIALYSIS CATHETER;  Surgeon: Angelia Mould, MD;  Location: [place];  Service:  Vascular;  Laterality: Right;   INTRAOPERATIVE ARTERIOGRAM Right 09/10/2017   Procedure: INTRA OPERATIVE ARTERIOGRAM;  Surgeon: Angelia Mould, MD;  Location: [place];  Service: Vascular;  Laterality: Right;   IR ANGIOGRAM FOLLOW UP STUDY  09/09/2017   IR FLUORO GUIDE CV LINE RIGHT  05/31/2020   IR US GUIDE VASC ACCESS RIGHT  06/01/2020   LEFT HEART CATH AND CORONARY ANGIOGRAPHY N/A 08/19/2020   Procedure: LEFT HEART CATH AND CORONARY ANGIOGRAPHY;  Surgeon: Adrian Prows, MD;  Location: [place] [facility];  Service: Cardiovascular;  Laterality: N/A;   LEFT HEART CATHETERIZATION WITH CORONARY ANGIOGRAM N/A 11/09/2014   Procedure: LEFT HEART CATHETERIZATION WITH CORONARY ANGIOGRAM;  Surgeon: Laverda Page, MD;  Location: [facility] CATH LAB;  Service: Cardiovascular;  Laterality: N/A;   LOWER EXTREMITY ANGIOGRAM Right 11/08/2015   Procedure: Lower Extremity Angiogram;  Surgeon: Serafina Mitchell, MD;  Location: [place] [facility];  Service: Cardiovascular;  Laterality: Right;   LUMBAR LAMINECTOMY  06/13/2012   Procedure: MICRODISCECTOMY LUMBAR LAMINECTOMY;  Surgeon: Jessy Oto, MD;  Location: [place];  Service: Orthopedics;  Laterality: N/A;  Central laminectomy L2-3, L3-4, L4-5   PACEMAKER INSERTION  10/08/2014   MDT Advisa MRI compatible dual chamber pacemaker implanted by Dr Caryl Comes for syncope with post-termination pauses   PERCUTANEOUS CORONARY STENT INTERVENTION (PCI-S)  11/09/2014   des to lad & distal circumflex         Dr  [name]   PERIPHERAL VASCULAR BALLOON ANGIOPLASTY Right 11/11/2019   Procedure: PERIPHERAL VASCULAR BALLOON ANGIOPLASTY;  Surgeon: Marty Heck, MD;  Location: [place] [facility];  Service: Cardiovascular;  Laterality: Right;  below knee popliteal, tibioperoneal trunk, posterior tibial arteries   PERIPHERAL VASCULAR CATHETERIZATION N/A 11/08/2015   Procedure: Abdominal Aortogram;  Surgeon: Serafina Mitchell, MD;  Location: [place] [facility];  Service: Cardiovascular;   Laterality: N/A;   PERIPHERAL VASCULAR CATHETERIZATION Right 11/08/2015   Procedure: Peripheral Vascular Atherectomy;  Surgeon: Serafina Mitchell, MD;  Location: [place] [facility];  Service: Cardiovascular;  Laterality: Right;   PERIPHERAL VASCULAR CATHETERIZATION N/A 10/10/2016   Procedure: Lower Extremity Angiography;  Surgeon: Waynetta Sandy, MD;  Location: [place] [facility];  Service: Cardiovascular;  Laterality: N/A;   PERIPHERAL VASCULAR CATHETERIZATION Right 10/10/2016   Procedure: Peripheral Vascular Atherectomy;  Surgeon: Waynetta Sandy, MD;  Location: [place] [facility];  Service: Cardiovascular;  Laterality: Right;  Popliteal   PERMANENT PACEMAKER INSERTION N/A 10/08/2014   Procedure: PERMANENT PACEMAKER INSERTION;  Surgeon: Deboraha Sprang, MD;  Location: [facility] CATH LAB;  Service: Cardiovascular;  Laterality: N/A;   SUBMUCOSAL TATTOO INJECTION  07/20/2020   Procedure: SUBMUCOSAL TATTOO INJECTION;  Surgeon: Irving Copas., MD;  Location: [place];  Service: Gastroenterology;;   TEE WITHOUT CARDIOVERSION N/A 10/27/2021   Procedure: TRANSESOPHAGEAL ECHOCARDIOGRAM (TEE);  Surgeon: Adrian Prows, MD;  Location: [place];  Service: Cardiovascular;  Laterality: N/A;  TEMPORARY PACEMAKER INSERTION Bilateral 10/07/2014   Procedure: TEMPORARY PACEMAKER INSERTION;  Surgeon: Laverda Page, MD;  Location: [facility] CATH LAB;  Service: Cardiovascular;  Laterality: Bilateral;   WOUND DEBRIDEMENT Right 02/19/2020   Procedure: DEBRIDEMENT WOUND RIGHT FOOT;  Surgeon: Angelia Mould, MD;  Location: [place];  Service: Vascular;  Laterality: Right;    Allergies: Cytoxan [cyclophosphamide], Pletal [cilostazol], Keflex [cephalexin], and Tylenol [acetaminophen]  Medications: Prior to Admission medications   Medication Sig Start Date End Date Taking? Authorizing Provider  amoxicillin (AMOXIL) 500 MG capsule Take 1 capsule (500 mg total) by mouth at bedtime. 11/22/21  11/17/22 Yes Vu, Rockey Situ, MD  apixaban (ELIQUIS) 5 MG TABS tablet Take 1 tablet (5 mg total) by mouth 2 (two) times daily. 12/11/21 03/11/22 Yes Cantwell, Celeste C, PA-C  atorvastatin (LIPITOR) 20 MG tablet Take 1 tablet (20 mg total) by mouth at bedtime. 10/01/21  Yes Aline August, MD  doxycycline (VIBRA-TABS) 100 MG tablet Take 1 tablet (100 mg total) by mouth 2 (two) times daily. 11/22/21 11/17/22 Yes Vu, Rockey Situ, MD  furosemide (LASIX) 80 MG tablet Take 80 mg by mouth See admin instructions. Take 80 mg by mouth on (non-dialysis days) Mondays, Wednesdays, Fridays & Sundays in the morning as needed "to cause urination" 02/26/21  Yes [provider]  ibuprofen (ADVIL) 800 MG tablet Take 800 mg by mouth every 8 (eight) hours as needed for moderate pain.   Yes [provider]  lidocaine-prilocaine (EMLA) cream Apply 1 application topically every dialysis (on Tues/Thurs/Sat).   Yes [provider]  metoprolol tartrate (LOPRESSOR) 25 MG tablet Take 1 tablet (25 mg total) by mouth 2 (two) times daily. 05/24/21 05/19/22 Yes Adrian Prows, MD  Multiple Vitamin (MULTIVITAMIN WITH MINERALS) TABS tablet Take 1 tablet by mouth 2 (two) times a week.   Yes [provider]  NOVOLIN 70/30 (70-30) 100 UNIT/ML injection Inject 16 Units into the skin 2 (two) times daily with a meal. Patient taking differently: Inject 10-20 Units into the skin 2 (two) times daily as needed (high blood sugar). 11/06/21  Yes Patrecia Pour, MD  oxyCODONE-acetaminophen (PERCOCET/ROXICET) 5-325 MG tablet Take 1 tablet by mouth every 8 (eight) hours as needed for moderate pain or severe pain. 01/11/22  Yes Newt Minion, MD  pantoprazole (PROTONIX) 40 MG tablet Take 40 mg by mouth daily.   Yes [provider]  ACCU-CHEK FASTCLIX LANCETS MISC Check blood sugar TID & QHS 10/01/14   Advani, Vernon Prey, MD  blood glucose meter kit and supplies KIT Check blood sugar TID & QHS 02/10/15   Advani, Vernon Prey, MD  Blood  Glucose Monitoring Suppl (ACCU-CHEK ADVANTAGE DIABETES) kit Use as instructed 10/01/14   Lorayne Marek, MD  glucose blood (ACCU-CHEK AVIVA PLUS) test strip Use as instructed 10/01/14   Advani, Vernon Prey, MD  glucose blood (CHOICE DM FORA G20 TEST STRIPS) test strip Check blood sugar TID & QHS 02/10/15   Advani, Vernon Prey, MD  glucose blood test strip Use as instructed 11/20/13   Reyne Dumas, MD  methylPREDNISolone (MEDROL DOSEPAK) 4 MG TBPK tablet See admin instructions. 12/20/21   [provider]     Family History  Problem Relation Age of Onset   Diabetes type II Mother    Hypertension Mother    Diabetes Mother    Liver cancer Father    Diabetes type II Sister    Breast cancer Sister    Diabetes Sister    Hypertension Sister    Diabetes type  II Brother    Kidney failure Brother    Diabetes Brother    Hypertension Brother    Diabetes type II Sister     Social History   Socioeconomic History   Marital status: Married    Spouse name: Not on file   Number of children: 2   Years of education: Not on file   Highest education level: Not on file  Occupational History   Occupation: Disabled  Tobacco Use   Smoking status: Former    Packs/day: 1.50    Years: 30.00    Pack years: 45.00    Types: Cigarettes    Quit date: 10/07/2014    Years since quitting: 7.2   Smokeless tobacco: Never   Tobacco comments:    Does an occasional cigar  Vaping Use   Vaping Use: Never used  Substance and Sexual Activity   Alcohol use: Not Currently    Alcohol/week: 21.0 standard drinks    Types: 21 Glasses of wine per week    Comment: cut way back, none in the last month   Drug use: No   Sexual activity: Not on file  Other Topics Concern   Not on file  Social History Narrative   Not on file   Social Determinants of Health   Financial Resource Strain: Not on file  Food Insecurity: Not on file  Transportation Needs: Not on file  Physical Activity: Not on file  Stress: Not on file   Social Connections: Not on file     Review of Systems: A 12 point ROS discussed and pertinent positives are indicated in the HPI above.  All other systems are negative.  Review of Systems  Vital Signs: BP 123/72    Pulse 75    Temp 97.6 F (36.4 C) (Oral)    Resp 15    Ht 6' (1.829 m)    Wt 205 lb 4 oz (93.1 kg)    SpO2 99%    BMI 27.84 kg/m   Physical Exam Vitals reviewed.  Constitutional:      Appearance: Normal appearance.  HENT:     Head: Normocephalic and atraumatic.  Eyes:     Extraocular Movements: Extraocular movements intact.  Cardiovascular:     Rate and Rhythm: Normal rate and regular rhythm.  Pulmonary:     Effort: Pulmonary effort is normal. No respiratory distress.     Breath sounds: Normal breath sounds.  Abdominal:     Palpations: Abdomen is soft.  Musculoskeletal:        General: Normal range of motion.     Cervical back: Normal range of motion.  Skin:    General: Skin is warm and dry.  Neurological:     General: No focal deficit present.     Mental Status: He is alert and oriented to person, place, and time.  Psychiatric:        Mood and Affect: Mood normal.        Behavior: Behavior normal.        Thought Content: Thought content normal.        Judgment: Judgment normal.    Imaging: CT Lumbar Spine Wo Contrast  Result Date: 01/13/2022 CLINICAL DATA:  Low back pain EXAM: CT LUMBAR SPINE WITHOUT CONTRAST TECHNIQUE: Multidetector CT imaging of the lumbar spine was performed without intravenous contrast administration. Multiplanar CT image reconstructions were also generated. RADIATION DOSE REDUCTION: This exam was performed according to the departmental dose-optimization program which includes automated exposure control, adjustment of the  mA and/or kV according to patient size and/or use of iterative reconstruction technique. COMPARISON:  10/28/2021 FINDINGS: Segmentation: 5 lumbar type vertebrae. Alignment: Normal. Vertebrae: There are bilateral  laminectomies at L3 and L4. There is progressive increase in size of inferior L2 and superior L3 endplate deformities. Gas in the disc space argues against infection. No acute fracture. Paraspinal and other soft tissues: Calcific aortic atherosclerosis. Disc levels: T12-L1: Unremarkable. L1-2: No spinal canal stenosis. L2-3: Progression of endplate changes. Large disc bulge with osteophytes. Moderate spinal canal and bilateral neural foraminal stenosis. L3-4: Posterior decompression without spinal canal stenosis or neural impingement. L4-5: Small left subarticular disc protrusion without stenosis. L5-S1: Disc bulge and endplate spurring with moderate bilateral foraminal stenosis. No spinal canal stenosis. IMPRESSION: 1. Progressive increase in size of inferior L2 and superior L3 endplate deformities, likely degenerative. Gas in the disc space argues against infection. 2. Moderate spinal canal and bilateral neural foraminal stenosis at L2-3. 3. Moderate bilateral L5-S1 foraminal stenosis. Aortic Atherosclerosis (ICD10-I70.0). Electronically Signed   By: Ulyses Jarred M.D.   On: 01/13/2022 23:15   MR LUMBAR SPINE WO CONTRAST  Result Date: 01/15/2022 CLINICAL DATA:  Fall today with left lower extremity weakness. Known lumbar spine compression deformities. EXAM: MRI LUMBAR SPINE WITHOUT CONTRAST TECHNIQUE: Multiplanar, multisequence MR imaging of the lumbar spine was performed. No intravenous contrast was administered. COMPARISON:  CT lumbar spine 01/13/2022, 10/28/2021 and 09/16/2021. FINDINGS: Despite efforts by the technologist and patient, mild motion artifact is present on today's exam and could not be eliminated. This reduces exam sensitivity and specificity. Segmentation: Conventional anatomy assumed, with the last open disc space designated L5-S1.Concordant with previous imaging. Alignment:  Physiologic. Vertebrae: As demonstrated on recent CT, there are progressive endplate deformities involving the inferior  endplate of L2 and superior endplate of L3 compared with previous CT of 09/16/2021. These deformities are associated with adjacent endplate sclerosis and no significant surrounding marrow edema. There is nonspecific T2 hyperintensity within the L2-3 disc. Endplate degenerative changes are also present at L4-5. Previous bilateral laminectomies at L3 and L4. The visualized sacroiliac joints appear unremarkable. Conus medullaris: Extends to the L2 level and appears normal. Paraspinal and other soft tissues: Mild generalized soft tissue edema and muscular atrophy. No focal paraspinal fluid collection identified. Disc levels: Sagittal images demonstrate mild disc degeneration and bulging at T11-12 without resulting spinal stenosis or nerve root encroachment. T12-L1: Normal interspace. L1-2: Mild disc bulging and bilateral facet hypertrophy. No significant spinal stenosis or nerve root encroachment. L2-3: As above, progressive endplate deformities with annular disc bulging and a right paracentral disc protrusion. Moderate facet and ligamentous hypertrophy. Resulting severe spinal stenosis with lateral recess and foraminal narrowing bilaterally. L3-4: Previous posterior decompression. Mild loss of disc height with annular disc bulging and mild facet hypertrophy. No significant residual spinal stenosis or nerve root encroachment. L4-5: Previous posterior decompression. Loss of disc height with annular disc bulging and a broad-based disc protrusion in the left subarticular zone. Mild bilateral facet hypertrophy. Mild asymmetric left lateral recess and left foraminal narrowing. L5-S1: Chronic degenerative disc disease with loss of disc height, annular disc bulging and endplate osteophytes. Chronic moderate foraminal narrowing bilaterally appears unchanged. IMPRESSION: 1. As seen on recent CT, there are progressive endplate deformities adjacent to the L2-3 disc with associated nonspecific discal hyperintensity. Based on  previous studies, findings may reflect progressive subacute fractures with progressive endplate degeneration. Partially treated discitis/osteomyelitis is a consideration, although there is no significant bone marrow edema or paraspinal fluid collection. 2.  Associated disc degeneration with annular disc bulging, facet and ligamentous hypertrophy contributing to severe multifactorial spinal stenosis and moderate narrowing of the lateral recesses and foramina bilaterally. 3. Previous posterior decompression at L3-4 and L4-5 without significant recurrent spinal stenosis. 4. Chronic moderate foraminal narrowing bilaterally at L5-S1 due to disc bulging and endplate osteophytes. Electronically Signed   By: Richardean Sale M.D.   On: 01/15/2022 14:17    Labs:  CBC: Recent Labs    01/15/22 0342 01/16/22 0333 01/17/22 0232 01/18/22 0337  WBC 9.9 7.5 9.2 9.5  HGB 9.5* 9.8* 9.5* 9.9*  HCT 30.7* 30.8* 30.4* 31.8*  PLT 99* 92* 109* 121*    COAGS: Recent Labs    10/30/21 1629 11/01/21 1810 11/02/21 0356 01/17/22 0232 01/18/22 0337  INR 3.0*  --   --  1.9* 1.4*  APTT  --  41* 70*  --   --     BMP: Recent Labs    01/15/22 0342 01/16/22 0333 01/17/22 0232 01/18/22 0337  NA 133* 130* 133* 133*  K 4.1 4.3 3.9 5.1  CL 97* 95* 98 97*  CO2 _0 GLUCOSE 145* 181* 241* 202*  BUN 24* 34* 27* 39*  CALCIUM 8.4* 8.5* 8.3* 8.5*  CREATININE 3.30* 3.99* 2.92* 3.56*  GFRNONAA 20* 16* 23* 18*    LIVER FUNCTION TESTS: Recent Labs    10/31/21 0427 11/02/21 0356 11/06/21 0754 12/06/21 0840 12/20/21 0927 01/13/22 1505  BILITOT 1.9*  --   --  1.0 1.1 0.9  AST 42*  --   --  11* 13* 25  ALT 87*  --   --  _1 ALKPHOS 65  --   --  84 99 91  PROT 5.3*  --   --  6.3* 6.1* 5.5*  ALBUMIN 1.6*   < > 1.8* 3.0* 2.9* 2.4*   < > = values in this interval not displayed.    TUMOR MARKERS: No results for input(s): AFPTM, CEA, CA199, CHROMGRNA in the last 8760 hours.  Assessment and  Plan:  Severe back pain.  MRI on 01/15/22 concerning for progressive subacute fractures with progressive endplate degeneration VS partially treated discitis/osteomyelitis.   Neurosurgery recommends  L2-3 disc aspiration prior to anticipated back surgery.  Patient unable to tolerate with just moderate sedation.  Will proceed with image guided disc aspiration today with anesthesia.  Risks and benefits of disc aspiration was discussed with the patient and/or patient's family including, but not limited to bleeding, infection, damage to adjacent structures or low yield requiring additional tests.  All of the questions were answered and there is agreement to proceed.  Consent signed and in chart.  Thank you for allowing our service to participate in [facility] 's care.  Electronically Signed: Murrell Redden, PA-C   01/19/2022, 11:21 AM      I spent a total of   15 Minutes in face to face in clinical consultation, greater than 50% of which was counseling/coordinating care for disc aspiration.

## 2022-01-19 NOTE — Anesthesia Procedure Notes (Signed)
Procedure Name: Intubation ?Date/Time: 01/19/2022 2:28 PM ?Performed by: Myna Bright, CRNA ?Pre-anesthesia Checklist: Patient identified, Emergency Drugs available, Suction available and Patient being monitored ?Patient Re-evaluated:Patient Re-evaluated prior to induction ?Oxygen Delivery Method: Circle system utilized ?Preoxygenation: Pre-oxygenation with 100% oxygen ?Induction Type: IV induction ?Ventilation: Mask ventilation without difficulty ?Laryngoscope Size: Mac and 4 ?Grade View: Grade I ?Tube type: Oral ?Tube size: 7.5 mm ?Number of attempts: 1 ?Airway Equipment and Method: Stylet ?Placement Confirmation: ETT inserted through vocal cords under direct vision, positive ETCO2 and breath sounds checked- equal and bilateral ?Secured at: 22 cm ?Tube secured with: Tape ?Dental Injury: Teeth and Oropharynx as per pre-operative assessment  ? ? ? ? ?

## 2022-01-19 NOTE — Anesthesia Postprocedure Evaluation (Signed)
Anesthesia Post Note ? ?Patient: Alexander [name] ? ?Procedure(s) Performed: Disc Aspiration ? ?  ? ?Patient location during evaluation: PACU ?Anesthesia Type: General ?Level of consciousness: awake ?Pain management: pain level controlled ?Vital Signs Assessment: post-procedure vital signs reviewed and stable ?Respiratory status: spontaneous breathing ?Cardiovascular status: stable ?Postop Assessment: no apparent nausea or vomiting ?Anesthetic complications: no ? ? ?No notable events documented. ? ?Last Vitals:  ?Vitals:  ? 01/19/22 1615 01/19/22 1619  ?BP:  133/79  ?Pulse: 98   ?Resp: (!) 21 16  ?Temp:  36.6 ?C  ?SpO2: 100% 99%  ?  ?Last Pain:  ?Vitals:  ? 01/19/22 1523  ?TempSrc:   ?PainSc: 0-No pain  ? ? ?  ?  ?  ?  ?  ?  ? ?Huston Foley ? ? ? ? ?

## 2022-01-19 NOTE — Progress Notes (Signed)
Patient ID: Alexander [name], male   DOB: 04/02/1961, 61 y.o.   MRN: [id number] ?Patient was out of the room presumptively for interventional radiology to do the aspiration of his disc space.  Currently patient is on the schedule for Monday afternoon for decompression and posterior lateral fusion at L2-3 if the disc base comes back infected we may opt to do the decompression alone go back and fuse later once we have had a chance to treat the infection.  So we will await culture results throughout the weekend. ?

## 2022-01-19 NOTE — Anesthesia Preprocedure Evaluation (Addendum)
Anesthesia Evaluation  ?Patient identified by MRN, date of birth, ID band ?Patient awake ? ? ? ?Reviewed: ?Allergy & Precautions, NPO status , Patient's Chart, lab work & pertinent test results ? ?Airway ?Mallampati: I ? ? ? ? ? ? Dental ? ?(+) Missing, Poor Dentition,  ?  ?Pulmonary ?former smoker,  ?  ?Pulmonary exam normal ? ? ? ? ? ? ? Cardiovascular ?hypertension, Pt. on home beta blockers ?+ CAD, + Cardiac Stents, + Peripheral Vascular Disease and +CHF  ?Normal cardiovascular exam+ dysrhythmias Atrial Fibrillation and Supra Ventricular Tachycardia + pacemaker  ? ? ?  ?Neuro/Psych ?negative psych ROS  ? GI/Hepatic ?GERD  Medicated and Controlled,  ?Endo/Other  ?diabetes ? Renal/GU ?Renal InsufficiencyRenal diseaseCKD III  ? ?  ?Musculoskeletal ? ? Abdominal ?Normal abdominal exam  (+)   ?Peds ? Hematology ? ?(+) Blood dyscrasia, anemia ,   ?Anesthesia Other Findings ?? Previously placed Mid Cx to Dist Cx stent (unknown type) is widely patent. ?? Previously placed Prox LAD to Mid LAD stent (unknown type) is widely patent. ?? Prox RCA to Mid RCA lesion is 90% stenosed. ?? LPAV lesion is 50% stenosed. ?? LV end diastolic pressure is normal. ?  ?IMPRESSIONS  ? ? ??1. Left ventricular ejection fraction, by estimation, is 40 to 45%. The  ?left ventricle has mildly decreased function. The left ventricle  ?demonstrates global hypokinesis. There is moderate concentric left  ?ventricular hypertrophy.  ??2. Right ventricular systolic function is normal. The right ventricular  ?size is normal.  ??3. Left atrial size was moderately dilated. No left atrial/left atrial  ?appendage thrombus was detected. The LAA emptying velocity was 30 cm/s.  ??4. Right atrial size was mildly dilated. There is a 1x1cm mass attached  ?to the RA pacemaker lead suggestive of thrombus or a vegetation.  ??5. The mitral valve is normal in structure. Mild to moderate mitral valve  ?regurgitation. No evidence of  mitral stenosis.  ??6. There is a 2cmx1cm mass attached to the RV lead of the pacemaker  ?suggestive of a vegetation or a thrombus. The Anterior and septal TV  ?leaflets are thickened and suggests involvement with the vegetation. The  ?septal leaflet is flail. The presence of  ?flail leaflet suggests vegetation. . The tricuspid valve is degenerative.  ?Tricuspid valve regurgitation is severe.  ??7. The aortic valve is normal in structure. Aortic valve regurgitation is  ?not visualized. No aortic stenosis is present.  ??8. There is mild (Grade II) plaque involving the ascending aorta, aortic  ?arch and descending aorta.  ? ?Comparison(s): A prior study was performed on 05/24/2120. Previously TTE  ?had revealed  ? Reproductive/Obstetrics ? ?  ? ? ? ? ? ? ? ? ? ? ? ? ? ?  ?  ? ? ? ? ? ? ?Anesthesia Physical ?Anesthesia Plan ? ?ASA: 3 ? ?Anesthesia Plan: General  ? ?Post-op Pain Management: Dilaudid IV  ? ?Induction: Intravenous ? ?PONV Risk Score and Plan:  ? ?Airway Management Planned: Oral ETT ? ?Additional Equipment: None ? ?Intra-op Plan:  ? ?Post-operative Plan: Extubation in OR ? ?Informed Consent: I have reviewed the patients History and Physical, chart, labs and discussed the procedure including the risks, benefits and alternatives for the proposed anesthesia with the patient or authorized representative who has indicated his/her understanding and acceptance.  ? ? ? ?Dental advisory given ? ?Plan Discussed with: CRNA ? ?Anesthesia Plan Comments:   ? ? ? ? ? ? ?Anesthesia Quick Evaluation ? ?

## 2022-01-19 NOTE — Transfer of Care (Signed)
Immediate Anesthesia Transfer of Care Note ? ?Patient: Alexander [name] ? ?Procedure(s) Performed: Disc Aspiration ? ?Patient Location: PACU ? ?Anesthesia Type:General ? ?Level of Consciousness: awake, alert , oriented and patient cooperative ? ?Airway & Oxygen Therapy: Patient Spontanous Breathing and Patient connected to nasal cannula oxygen ? ?Post-op Assessment: Report given to RN, Post -op Vital signs reviewed and stable and Patient moving all extremities ? ?Post vital signs: Reviewed and stable ? ?Last Vitals:  ?Vitals Value Taken Time  ?BP 124/80 01/19/22 1523  ?Temp    ?Pulse 106 01/19/22 1529  ?Resp 20 01/19/22 1529  ?SpO2 87 % 01/19/22 1529  ?Vitals shown include unvalidated device data. ? ?Last Pain:  ?Vitals:  ? 01/19/22 1300  ?TempSrc: Oral  ?PainSc:   ?   ? ?Patients Stated Pain Goal: 2 (01/17/22 0023) ? ?Complications: No notable events documented. ?

## 2022-01-20 ENCOUNTER — Inpatient Hospital Stay (HOSPITAL_COMMUNITY): Payer: Medicare Other

## 2022-01-20 LAB — CBC WITH DIFFERENTIAL/PLATELET
Abs Immature Granulocytes: 0.04 10*3/uL (ref 0.00–0.07)
Basophils Absolute: 0 10*3/uL (ref 0.0–0.1)
Basophils Relative: 0 %
Eosinophils Absolute: 0.1 10*3/uL (ref 0.0–0.5)
Eosinophils Relative: 1 %
HCT: 31.5 % — ABNORMAL LOW (ref 39.0–52.0)
Hemoglobin: 9.7 g/dL — ABNORMAL LOW (ref 13.0–17.0)
Immature Granulocytes: 0 %
Lymphocytes Relative: 21 %
Lymphs Abs: 2 10*3/uL (ref 0.7–4.0)
MCH: 30.7 pg (ref 26.0–34.0)
MCHC: 30.8 g/dL (ref 30.0–36.0)
MCV: 99.7 fL (ref 80.0–100.0)
Monocytes Absolute: 0.6 10*3/uL (ref 0.1–1.0)
Monocytes Relative: 6 %
Neutro Abs: 7.1 10*3/uL (ref 1.7–7.7)
Neutrophils Relative %: 72 %
Platelets: 115 10*3/uL — ABNORMAL LOW (ref 150–400)
RBC: 3.16 MIL/uL — ABNORMAL LOW (ref 4.22–5.81)
RDW: 18.6 % — ABNORMAL HIGH (ref 11.5–15.5)
WBC: 9.9 10*3/uL (ref 4.0–10.5)
nRBC: 0.2 % (ref 0.0–0.2)

## 2022-01-20 LAB — RENAL FUNCTION PANEL
Albumin: 1.9 g/dL — ABNORMAL LOW (ref 3.5–5.0)
Anion gap: 9 (ref 5–15)
BUN: 28 mg/dL — ABNORMAL HIGH (ref 8–23)
CO2: 28 mmol/L (ref 22–32)
Calcium: 8.2 mg/dL — ABNORMAL LOW (ref 8.9–10.3)
Chloride: 98 mmol/L (ref 98–111)
Creatinine, Ser: 3.15 mg/dL — ABNORMAL HIGH (ref 0.61–1.24)
GFR, Estimated: 21 mL/min — ABNORMAL LOW (ref >60)
Glucose, Bld: 101 mg/dL — ABNORMAL HIGH (ref 70–99)
Phosphorus: 4.1 mg/dL (ref 2.5–4.6)
Potassium: 4.2 mmol/L (ref 3.5–5.1)
Sodium: 135 mmol/L (ref 135–145)

## 2022-01-20 LAB — GLUCOSE, CAPILLARY
Glucose-Capillary: 132 mg/dL — ABNORMAL HIGH (ref 70–99)
Glucose-Capillary: 90 mg/dL (ref 70–99)
Glucose-Capillary: 63 mg/dL — ABNORMAL LOW (ref 70–99)
Glucose-Capillary: 95 mg/dL (ref 70–99)
Glucose-Capillary: 198 mg/dL — ABNORMAL HIGH (ref 70–99)

## 2022-01-20 MED ORDER — SODIUM CHLORIDE 0.9 % IV SOLN: 100.0000 mL | INTRAVENOUS | Status: DC | PRN

## 2022-01-20 MED ORDER — HYDROMORPHONE HCL 1 MG/ML IJ SOLN
1.0000 mg | INTRAMUSCULAR | Status: DC | PRN
  Administered 2022-01-20 (×2): 1 mg via INTRAVENOUS
  Administered 2022-01-21 (×3): 2 mg via INTRAVENOUS
  Administered 2022-01-21: 1 mg via INTRAVENOUS
  Administered 2022-01-22 (×2): 2 mg via INTRAVENOUS
  Filled 2022-01-20 (×2): qty 1
  Filled 2022-01-20 (×2): qty 2
  Filled 2022-01-20: qty 1
  Filled 2022-01-20 (×3): qty 2

## 2022-01-20 NOTE — Progress Notes (Signed)
Overall stable.  Patient's back pain and lower extremity symptoms unchanged.  Remains bedbound. ? ?Disc aspiration results without obvious signs of infection at present. ? ?Patient with severe stenosis at L2-3.  Awaiting surgery for decompression and/or fusion Monday. ?

## 2022-01-20 NOTE — Progress Notes (Signed)
PROGRESS NOTE    Alexander [name]  EHO:[id number] DOB: 12-Apr-1961 DOA: 01/13/2022 PCP: [name], MD    Brief Narrative:  Alexander [name] is a 61 y.o. male with medical history significant for ESRD on HD on TTS, HTN, HLD, PVD status post right BKA and left midfoot amputation, Afib on Eliquis, CAD, DM2, and status post pacemaker, presented to the ER with worsening intractable low back pain over the last couple of days with associated left lower extremity radiation and radiculopathy with associated spasm and weakness, denies any urinary or stool incontinence. He has been having chronic low back pain with occasional radiation to both lower extremities. Pt has been followed by spine surgery and was scheduled for LS spine MRI on 02/07/2022.  Plan is for disc aspiration and if no infection then patient will have surgical decompression.  Aspiration done 3/10.  Surgery planned for Monday.      Assessment and Plan: * Lumbar back pain with radiculopathy affecting left lower extremity CRP, ESR elevated CT noted moderate neuroforaminal stenosis at L2-L3 and L5-S1 MRI lumbar spine showed subacute fractures L2-L3 disc, partially treated discitis/osteomyelitis is a consideration, severe multifactorial spinal stenosis at L2-3 Followed by spine surgery Dr Saintclair Halsted, rec disc aspiration by IR to rule out any discitis and plan for surgical decompression IR consulted for disc aspiration to rule out any infection,- due to pain will have to be done with anesthesia on 3/10 Completed IV decadron Pain management as able- appeared to improve with robaxin PT/OT- recommending SNF  ESRD (end stage renal disease) ([place]) HD- T/T/S Nephrology consulted  PAF (paroxysmal atrial fibrillation) ([facility]) Continue Lopressor and hold Eliquis due to upcoming procedure  Essential hypertension, benign Continue Lopressor  Dyslipidemia - We will continue statin therapy.  Type 2 diabetes mellitus with chronic kidney disease, with  long-term current use of insulin ([facility]) Last A1c 6.8 SSI, NovoLog 70/30 twice daily, hypoglycemic protocol, Accu-Cheks  GERD without esophagitis Continue PPI therapy.  Below-knee amputation of right lower extremity ([place]) At outpatient visit on 01/10/2022 with orthopedics, noted BLE with significant weeping, edema with induration, both noted with distal maceration with L LE noted open ulceration Concern for ischemic changes, placed in Unna and Dynaflex compression wraps bilaterally Consulted [name], we will continue with serial compression wraps and outpatient follow-up  Pressure injury of skin Pressure Injury 01/14/22 Ischial tuberosity Right Stage 3 -  Full thickness tissue loss. Subcutaneous fat may be visible but bone, tendon or muscle are NOT exposed. (Active)  01/14/22 0321  Location: Ischial tuberosity  Location Orientation: Right  Staging: Stage 3 -  Full thickness tissue loss. Subcutaneous fat may be visible but bone, tendon or muscle are NOT exposed.  Wound Description (Comments):   Present on Admission: Yes     Pressure Injury 01/14/22 Buttocks Right Stage 2 -  Partial thickness loss of dermis presenting as a shallow open injury with a red, pink wound bed without slough. (Active)  01/14/22 0322  Location: Buttocks  Location Orientation: Right  Staging: Stage 2 -  Partial thickness loss of dermis presenting as a shallow open injury with a red, pink wound bed without slough.  Wound Description (Comments):   Present on Admission: Yes     Pressure Injury 01/14/22 Sacrum Mid Stage 2 -  Partial thickness loss of dermis presenting as a shallow open injury with a red, pink wound bed without slough. (Active)  01/14/22 0322  Location: Sacrum  Location Orientation: Mid  Staging: Stage 2 -  Partial thickness loss of dermis presenting as a shallow open injury with a red, pink wound bed without slough.  Wound Description (Comments):   Present on Admission: Yes     Pressure Injury  01/14/22 Buttocks Left Stage 2 -  Partial thickness loss of dermis presenting as a shallow open injury with a red, pink wound bed without slough. (Active)  01/14/22 0323  Location: Buttocks  Location Orientation: Left  Staging: Stage 2 -  Partial thickness loss of dermis presenting as a shallow open injury with a red, pink wound bed without slough.  Wound Description (Comments):   Present on Admission: Yes     Pressure Injury 01/14/22 Buttocks Posterior;Proximal;Right Stage 2 -  Partial thickness loss of dermis presenting as a shallow open injury with a red, pink wound bed without slough. (Active)  01/14/22 0324  Location: Buttocks  Location Orientation: Posterior;Proximal;Right  Staging: Stage 2 -  Partial thickness loss of dermis presenting as a shallow open injury with a red, pink wound bed without slough.  Wound Description (Comments):   Present on Admission: Yes       Pacemaker infection ([place]) Follows with ID outpatient -apprecaited ID consult-- will plan to resume abx after aspiration -last progress note: Plan to transition to doxy/amoxicillin 100 mg po bid and 500 mg po qhs -- indefinitely -spoke with Dr. [name]-- will get limited echo to assess treatment          DVT prophylaxis:     Code Status: Full Code Family Communication:   Disposition Plan:  Level of care: Med-Surg Status is: Inpatient Remains inpatient appropriate because: needs urgery    Consultants:  NS Palliative care nephrology   Subjective: In bed, NAD  Objective: Vitals:   01/20/22 1221 01/20/22 1241 01/20/22 1256 01/20/22 1330  BP: 120/72 120/72 133/72 117/65  Pulse: (!) 108 (!) 108 (!) 117 75  Resp: 18 16    Temp: (!) 97.3 F (36.3 C) (!) 97.3 F (36.3 C)    TempSrc:  Temporal    SpO2:      Weight: 90.7 kg 90.7 kg    Height:        Intake/Output Summary (Last 24 hours) at 01/20/2022 1344 Last data filed at 01/20/2022 0846 Gross per 24 hour  Intake 1530 ml  Output 1 ml  Net  1529 ml   Filed Weights   01/19/22 0740 01/20/22 1221 01/20/22 1241  Weight: 93.1 kg 90.7 kg 90.7 kg    Examination:   General: Appearance:     Overweight male in no acute distress     Lungs:     respirations unlabored  Heart:    Normal heart rate.    MS:   Below knee amputation of right lower extremity is noted. Amputation of left foot is noted. Left  amputation noted. Right  amputation noted.    Neurologic:   Awake, alert, oriented x 3. No apparent focal neurological           defect.        Data Reviewed: I have personally reviewed following labs and imaging studies  CBC: Recent Labs  Lab 01/16/22 0333 01/17/22 0232 01/18/22 0337 01/19/22 1651 01/20/22 0203  WBC 7.5 9.2 9.5 10.3 9.9  NEUTROABS 6.1 7.4 6.2 8.1* 7.1  HGB 9.8* 9.5* 9.9* 10.3* 9.7*  HCT 30.8* 30.4* 31.8* 34.3* 31.5*  MCV 97.8 98.1 100.3* 100.0 99.7  PLT 92* 109* 121* 131* 132*   Basic Metabolic Panel: Recent Labs  Lab 01/13/22 1505  01/14/22 0415 01/15/22 0342 01/16/22 0333 01/17/22 0232 01/18/22 0337 01/19/22 1227  NA 135   < > 133* 130* 133* 133* 134*  K 3.7   < > 4.1 4.3 3.9 5.1 4.3  CL 96*   < > 97* 95* 98 97* 95*  CO2 28   < > '25 24 25 25 22  ' GLUCOSE 157*   < > 145* 181* 241* 202* 168*  BUN 14   < > 24* 34* 27* 39* 27*  CREATININE 2.03*   < > 3.30* 3.99* 2.92* 3.56* 2.82*  CALCIUM 8.4*   < > 8.4* 8.5* 8.3* 8.5* 8.2*  MG 1.7  --   --   --   --   --   --   PHOS  --   --   --  4.9*  --  4.9*  --    < > = values in this interval not displayed.   GFR: Estimated Creatinine Clearance: 29.4 mL/min (A) (by C-G formula based on SCr of 2.82 mg/dL (H)). Liver Function Tests: Recent Labs  Lab 01/13/22 1505  AST 25  ALT 19  ALKPHOS 91  BILITOT 0.9  PROT 5.5*  ALBUMIN 2.4*   No results for input(s): LIPASE, AMYLASE in the last 168 hours. No results for input(s): AMMONIA in the last 168 hours. Coagulation Profile: Recent Labs  Lab 01/17/22 0232 01/18/22 0337  INR 1.9* 1.4*    Cardiac Enzymes: No results for input(s): CKTOTAL, CKMB, CKMBINDEX, TROPONINI in the last 168 hours. BNP (last 3 results) No results for input(s): PROBNP in the last 8760 hours. HbA1C: No results for input(s): HGBA1C in the last 72 hours. CBG: Recent Labs  Lab 01/19/22 1559 01/19/22 1654 01/19/22 2107 01/20/22 0719 01/20/22 1130  GLUCAP 169* 203* 141* 132* 90   Lipid Profile: No results for input(s): CHOL, HDL, LDLCALC, TRIG, CHOLHDL, LDLDIRECT in the last 72 hours. Thyroid Function Tests: No results for input(s): TSH, T4TOTAL, FREET4, T3FREE, THYROIDAB in the last 72 hours. Anemia Panel: No results for input(s): VITAMINB12, FOLATE, FERRITIN, TIBC, IRON, RETICCTPCT in the last 72 hours. Sepsis Labs: No results for input(s): PROCALCITON, LATICACIDVEN in the last 168 hours.  Recent Results (from the past 240 hour(s))  Resp Panel by RT-PCR (Flu A&B, Covid) Nasopharyngeal Swab     Status: None   Collection Time: 01/14/22  1:13 AM   Specimen: Nasopharyngeal Swab; Nasopharyngeal(NP) swabs in vial transport medium  Result Value Ref Range Status   SARS Coronavirus 2 by RT PCR NEGATIVE NEGATIVE Final    Comment: (NOTE) SARS-CoV-2 target nucleic acids are NOT DETECTED.  The SARS-CoV-2 RNA is generally detectable in upper respiratory specimens during the acute phase of infection. The lowest concentration of SARS-CoV-2 viral copies this assay can detect is 138 copies/mL. A negative result does not preclude SARS-Cov-2 infection and should not be used as the sole basis for treatment or other patient management decisions. A negative result may occur with  improper specimen collection/handling, submission of specimen other than nasopharyngeal swab, presence of viral mutation(s) within the areas targeted by this assay, and inadequate number of viral copies(<138 copies/mL). A negative result must be combined with clinical observations, patient history, and epidemiological information.  The expected result is Negative.  Fact Sheet for Patients:  EntrepreneurPulse.com.au  Fact Sheet for Healthcare Providers:  IncredibleEmployment.be  This test is no t yet approved or cleared by the [place] FDA and  has been authorized for detection and/or diagnosis of SARS-CoV-2 by FDA under an  Emergency Use Authorization (EUA). This EUA will remain  in effect (meaning this test can be used) for the duration of the COVID-19 declaration under Section 564(b)(1) of the Act, 21 U.S.C.section 360bbb-3(b)(1), unless the authorization is terminated  or revoked sooner.       Influenza A by PCR NEGATIVE NEGATIVE Final   Influenza B by PCR NEGATIVE NEGATIVE Final    Comment: (NOTE) The Xpert Xpress SARS-CoV-2/FLU/RSV plus assay is intended as an aid in the diagnosis of influenza from Nasopharyngeal swab specimens and should not be used as a sole basis for treatment. Nasal washings and aspirates are unacceptable for Xpert Xpress SARS-CoV-2/FLU/RSV testing.  Fact Sheet for Patients: EntrepreneurPulse.com.au  Fact Sheet for Healthcare Providers: IncredibleEmployment.be  This test is not yet approved or cleared by the [place] FDA and has been authorized for detection and/or diagnosis of SARS-CoV-2 by FDA under an Emergency Use Authorization (EUA). This EUA will remain in effect (meaning this test can be used) for the duration of the COVID-19 declaration under Section 564(b)(1) of the Act, 21 U.S.C. section 360bbb-3(b)(1), unless the authorization is terminated or revoked.  Performed at [facility], [place] [address] [place], [place] [address]   MRSA Next Gen by PCR, Nasal     Status: None   Collection Time: 01/14/22  7:18 AM   Specimen: Nasal Mucosa; Nasal Swab  Result Value Ref Range Status   MRSA by PCR Next Gen NOT DETECTED NOT DETECTED Final    Comment: (NOTE) The GeneXpert MRSA Assay (FDA  approved for NASAL specimens only), is one component of a comprehensive MRSA colonization surveillance program. It is not intended to diagnose MRSA infection nor to guide or monitor treatment for MRSA infections. Test performance is not FDA approved in patients less than 20 years old. Performed at [facility], [place] [address], [place], [place] [address]   Culture, blood (routine x 2)     Status: None   Collection Time: 01/14/22  3:24 PM   Specimen: BLOOD LEFT WRIST  Result Value Ref Range Status   Specimen Description BLOOD LEFT WRIST  Final   Special Requests   Final    BOTTLES DRAWN AEROBIC AND ANAEROBIC Blood Culture results may not be optimal due to an inadequate volume of blood received in culture bottles   Culture   Final    NO GROWTH 5 DAYS Performed at [facility], [place] [address] [place], [place] [address]    Report Status 01/19/2022 FINAL  Final  Culture, blood (routine x 2)     Status: None   Collection Time: 01/14/22  3:33 PM   Specimen: BLOOD LEFT FOREARM  Result Value Ref Range Status   Specimen Description BLOOD LEFT FOREARM  Final   Special Requests   Final    BOTTLES DRAWN AEROBIC AND ANAEROBIC Blood Culture adequate volume   Culture   Final    NO GROWTH 5 DAYS Performed at [facility], [place] [address], [place], [place] [address]    Report Status 01/19/2022 FINAL  Final  Surgical pcr screen     Status: None   Collection Time: 01/19/22 12:05 PM   Specimen: Nasal Mucosa; Nasal Swab  Result Value Ref Range Status   MRSA, PCR NEGATIVE NEGATIVE Final   Staphylococcus aureus NEGATIVE NEGATIVE Final    Comment: (NOTE) The Xpert SA Assay (FDA approved for NASAL specimens in patients 60 years of age and older), is one component of a comprehensive surveillance  program. It is not intended to diagnose infection nor to guide or monitor treatment. Performed at [facility], [place] [address], [place], [place] [address]   Aerobic/Anaerobic Culture  w Gram Stain (surgical/deep wound)     Status: None (Preliminary result)   Collection Time: 01/19/22  2:52 PM   Specimen: PATH Cytology FNA; Tissue  Result Value Ref Range Status   Specimen Description WOUND  Final   Special Requests L3/L4 DISC ASPIRATION  Final   Gram Stain   Final    RARE WBC PRESENT,BOTH PMN AND MONONUCLEAR NO ORGANISMS SEEN    Culture   Final    NO GROWTH < 24 HOURS Performed at [facility], [place] [address], [place], [place] [address]    Report Status PENDING  Incomplete         Radiology Studies: IR LUMBAR DISC ASPIRATION W/IMG GUIDE  Result Date: 01/19/2022 INDICATION: Back pain with MRI of the lumbar spine suggestive of L2-3 discitis/osteomyelitis. Fine-needle aspiration required for culture. EXAM: FLUOROSCOPY GUIDED L2-3 INTERVERTEBRAL DISC ASPIRATION MEDICATIONS: The patient is currently admitted to the hospital and receiving intravenous antibiotics. The antibiotics were administered within an appropriate time frame prior to the initiation of the procedure. ANESTHESIA/SEDATION: The procedure was performed under general anesthesia. COMPLICATIONS: None immediate. PROCEDURE: Informed written consent was obtained from the patient after a thorough discussion of the procedural risks, benefits and alternatives. All questions were addressed. Maximal Sterile Barrier Technique was utilized including caps, mask, sterile gowns, sterile gloves, sterile drape, hand hygiene and skin antiseptic. A timeout was performed prior to the initiation of the procedure. The patient was placed in prone position on the angiography table. The lumbar spine region was prepped and draped in a sterile fashion. Under fluoroscopy, the L2-3 intervertebral disc was delineated and the skin area was marked. The skin was infiltrated with a 1% Lidocaine approximately 5 cm lateral to the spinous process projection on the left. Subsequently, using a left posterolateral approach, an 18-gauge trocar  needle was advanced into the L2-3 disc space. A 22-gauge spinal needle was then coaxially advanced into the disc. Two fine-needle aspiration samples were obtained with blood tinged fluid and whitish debris into syringes. The syringes were capped and sent to the laboratory for culture. The needle was later removed. The access site was cleaned and covered with a sterile bandage. IMPRESSION: Successful fluoroscopy guided L2-3 intervertebral disc fine-needle aspiration. Samples collected were sent to the laboratory for microbiology analysis. Electronically Signed   By: Pedro Earls M.D.   On: 01/19/2022 15:48        Scheduled Meds:  amoxicillin  500 mg Oral Q2000   atorvastatin  20 mg Oral QHS   calcitRIOL  0.25 mcg Oral Daily   Chlorhexidine Gluconate Cloth  6 each Topical Daily   doxycycline  100 mg Oral Q12H   furosemide  80 mg Oral Once per day on Sun Mon Wed Fri   insulin aspart  0-6 Units Subcutaneous TID WC   insulin aspart protamine- aspart  16 Units Subcutaneous BID WC   lidocaine  1 patch Transdermal Q24H   metoprolol tartrate  25 mg Oral BID   multivitamin with minerals  1 tablet Oral Once per day on Mon Thu   pantoprazole  40 mg Oral Daily   Continuous Infusions:   LOS: 7 days    Time spent: 75 minutes spent on chart review, discussion with nursing staff, consultants, updating family and interview/physical exam; more than 50%  of that time was spent in counseling and/or coordination of care.    Geradine Girt, DO [facility] Available via Epic secure chat 7am-7pm After these hours, please refer to coverage provider listed on [email or web address] 01/20/2022, 1:44 PM

## 2022-01-20 NOTE — Progress Notes (Signed)
Hypoglycemic Event ? ?CBG: 63 ? ?Treatment: 4 oz juice/soda ? ?Symptoms: Hungry ? ?Follow-up CBG: [id number] CBG Result:95 ? ?Possible Reasons for Event: Inadequate meal intake ? ?Comments/MD notified:MD Eliseo Squires ? ? ? ?Alexander [name] ?  ?

## 2022-01-20 NOTE — Progress Notes (Signed)
?  Echocardiogram ?2D Echocardiogram has been performed. ? ?Alexander [name] ?01/20/2022, 4:39 PM ?

## 2022-01-20 NOTE — Progress Notes (Signed)
removed 2083ms net fluid , gave calcitriol as ordered, robaxin for muscle spasm as needed. pt in a lot of pain upon arrival no pain meds available.,  pre bp 120/72 post bp 131/73 pre weight 90.7kg post weight 88.5kg bed scales.   2 bandages to rua avf no bleeding dressing cdi. ?

## 2022-01-20 NOTE — Progress Notes (Signed)
? ? ? ?  Referral received for Alexander [name] for goals of care discussion. I again attempted to speak with the patient for discussions. However, he is in hemodialysis and has been the majority of the day.  ? ?PMT will re-attempt to speak with the patient tomorrow. Detailed note and recommendations to follow once GOC has been completed.  ? ?Thank you for your referral and allowing PMT to assist in [place] care.  ? ?Walden Field, NP ?Palliative Medicine Team ?Phone: [phone] ? ?NO CHARGE  ? ?

## 2022-01-20 NOTE — Progress Notes (Signed)
?[facility] ?Progress Note  ? ?Subjective: Still off HD schedule. HD 01/19/2022. Needs to be changed back to T,Th,S schedule as they are planning surgery 01/22/2022. Short HD today to resume T,Th, S schedule.  ? ?Objective ?Vitals:  ? 01/19/22 1656 01/19/22 2005 01/20/22 0505 01/20/22 0953  ?BP: 101/67 122/80 120/66 133/67  ?Pulse: 85 99 98 95  ?Resp: '18 18 17 18  '$ ?Temp: 98.7 ?F (37.1 ?C) 97.6 ?F (36.4 ?C) 97.8 ?F (36.6 ?C) 98.2 ?F (36.8 ?C)  ?TempSrc: Oral Oral Oral Oral  ?SpO2: 99% 91% 99% 97%  ?Weight:      ?Height:      ? ?Physical Exam ?General: Chronically ill appearing male in NAD.  ?Heart: S1,S2 RRR. No M/R/G ?Lungs: CTAB ?Abdomen: obese, NT. NABS ?Extremities:BLE wrapped in una boots.  Truly cannot assess edema. R BKA, L toe amps.  ?Dialysis Access: R AVF +T/B ? ?Additional Objective ?Labs: ?Basic Metabolic Panel: ?Recent Labs  ?Lab 01/16/22 ?0333 01/17/22 ?0232 01/18/22 ?1093 01/19/22 ?1227  ?NA 130* 133* 133* 134*  ?K 4.3 3.9 5.1 4.3  ?CL 95* 98 97* 95*  ?CO2 '24 25 25 22  '$ ?GLUCOSE 181* 241* 202* 168*  ?BUN 34* 27* 39* 27*  ?CREATININE 3.99* 2.92* 3.56* 2.82*  ?CALCIUM 8.5* 8.3* 8.5* 8.2*  ?PHOS 4.9*  --  4.9*  --   ? ?Liver Function Tests: ?Recent Labs  ?Lab 01/13/22 ?1505  ?AST 25  ?ALT 19  ?ALKPHOS 91  ?BILITOT 0.9  ?PROT 5.5*  ?ALBUMIN 2.4*  ? ?No results for input(s): LIPASE, AMYLASE in the last 168 hours. ?CBC: ?Recent Labs  ?Lab 01/16/22 ?0333 01/17/22 ?0232 01/18/22 ?2355 01/19/22 ?1651 01/20/22 ?0203  ?WBC 7.5 9.2 9.5 10.3 9.9  ?NEUTROABS 6.1 7.4 6.2 8.1* 7.1  ?HGB 9.8* 9.5* 9.9* 10.3* 9.7*  ?HCT 30.8* 30.4* 31.8* 34.3* 31.5*  ?MCV 97.8 98.1 100.3* 100.0 99.7  ?PLT 92* 109* 121* 131* 115*  ? ?Blood Culture ?   ?Component Value Date/Time  ? SDES WOUND 01/19/2022 1452  ? SPECREQUEST L3/L4 [place] ASPIRATION 01/19/2022 1452  ? CULT PENDING 01/19/2022 1452  ? REPTSTATUS PENDING 01/19/2022 1452  ? ? ?Cardiac Enzymes: ?No results for input(s): CKTOTAL, CKMB, CKMBINDEX, TROPONINI in the  last 168 hours. ?CBG: ?Recent Labs  ?Lab 01/19/22 ?1559 01/19/22 ?1654 01/19/22 ?2107 01/20/22 ?0719 01/20/22 ?1130  ?GLUCAP [place] ?Iron Studies: No results for input(s): IRON, TIBC, TRANSFERRIN, FERRITIN in the last 72 hours. ?'@lablastinr3'$ @ ?Studies/Results: ?IR LUMBAR DISC ASPIRATION W/IMG GUIDE ? ?Result Date: 01/19/2022 ?INDICATION: Back pain with MRI of the lumbar spine suggestive of L2-3 discitis/osteomyelitis. Fine-needle aspiration required for culture. EXAM: FLUOROSCOPY GUIDED L2-3 INTERVERTEBRAL DISC ASPIRATION MEDICATIONS: The patient is currently admitted to the hospital and receiving intravenous antibiotics. The antibiotics were administered within an appropriate time frame prior to the initiation of the procedure. ANESTHESIA/SEDATION: The procedure was performed under general anesthesia. COMPLICATIONS: None immediate. PROCEDURE: Informed written consent was obtained from the patient after a thorough discussion of the procedural risks, benefits and alternatives. All questions were addressed. Maximal Sterile Barrier Technique was utilized including caps, mask, sterile gowns, sterile gloves, sterile drape, hand hygiene and skin antiseptic. A timeout was performed prior to the initiation of the procedure. The patient was placed in prone position on the angiography table. The lumbar spine region was prepped and draped in a sterile fashion. Under fluoroscopy, the L2-3 intervertebral disc was delineated and the skin area was marked. The skin was infiltrated with a 1%  Lidocaine approximately 5 cm lateral to the spinous process projection on the left. Subsequently, using a left posterolateral approach, an 18-gauge trocar needle was advanced into the L2-3 disc space. A 22-gauge spinal needle was then coaxially advanced into the disc. Two fine-needle aspiration samples were obtained with blood tinged fluid and whitish debris into syringes. The syringes were capped and sent to the laboratory for  culture. The needle was later removed. The access site was cleaned and covered with a sterile bandage. IMPRESSION: Successful fluoroscopy guided L2-3 intervertebral disc fine-needle aspiration. Samples collected were sent to the laboratory for microbiology analysis. Electronically Signed   By: Pedro Earls M.D.   On: 01/19/2022 15:48   ?Medications: ? ? amoxicillin  500 mg Oral Q2000  ? atorvastatin  20 mg Oral QHS  ? calcitRIOL  0.25 mcg Oral Daily  ? Chlorhexidine Gluconate Cloth  6 each Topical Daily  ? doxycycline  100 mg Oral Q12H  ? furosemide  80 mg Oral Once per day on Sun Mon Wed Fri  ? insulin aspart  0-6 Units Subcutaneous TID WC  ? insulin aspart protamine- aspart  16 Units Subcutaneous BID WC  ? lidocaine  1 patch Transdermal Q24H  ? metoprolol tartrate  25 mg Oral BID  ? multivitamin with minerals  1 tablet Oral Once per day on Mon Thu  ? pantoprazole  40 mg Oral Daily  ? ? ? ?Dialysis Orders: ?TTS - GOC ?4hrs81mn, BFR 400, DFR 800,  EDW 94kg, 3K/ 2.5Ca ?No Systemic Heparin ?Mircera 200 mcg q2wks - last 01/11/22 ?Calcitriol 0.240m PO qHD - last 01/13/22 ?  ?Assessment/Plan: ?1 Lumbar back pain/Radiculopathy: Followed by Spine Surgery Dr. CrSaintclair HalstedMR Lumbar Spine completed: showed fractures L2-L3 with fluid-? Trauma vs. Infection. Reviewed Dr. CrWindy Carinaast note: recommend surgical decompression. IR consulted-plan for L2-3 disc aspiration, procedure 01/19/2022. Planning for OR 01/22/2022. Continue pain management and Pt/OT ?2 ESRD - on HD TTS. HD today off schedule D/T census and staffing. Short HD 01/20/2022 to get back on T,Th,S schedule.  ?3  Hypertension/volume  - Currently not volume overloaded. Bps stable ?4. Anemia of CKD - Hgb 9.9. ESA not due yet ?5. Secondary Hyperparathyroidism - PO4 at goal. Continue VDRA. ?6. Nutrition - Renal diet on fluid restriction ? ?Korben Carcione H. Londin Antone NP-C ?01/20/2022, 12:19 PM  ?[facility] ?[phone] ? ?  ? ?

## 2022-01-21 DIAGNOSIS — I38 Endocarditis, valve unspecified: Secondary | ICD-10-CM

## 2022-01-21 LAB — GLUCOSE, CAPILLARY
Glucose-Capillary: 149 mg/dL — ABNORMAL HIGH (ref 70–99)
Glucose-Capillary: 136 mg/dL — ABNORMAL HIGH (ref 70–99)
Glucose-Capillary: 163 mg/dL — ABNORMAL HIGH (ref 70–99)
Glucose-Capillary: 88 mg/dL (ref 70–99)

## 2022-01-21 LAB — CBC WITH DIFFERENTIAL/PLATELET
Abs Immature Granulocytes: 0.04 10*3/uL (ref 0.00–0.07)
Basophils Absolute: 0 10*3/uL (ref 0.0–0.1)
Basophils Relative: 0 %
Eosinophils Absolute: 0 10*3/uL (ref 0.0–0.5)
Eosinophils Relative: 0 %
HCT: 32.7 % — ABNORMAL LOW (ref 39.0–52.0)
Hemoglobin: 10.1 g/dL — ABNORMAL LOW (ref 13.0–17.0)
Immature Granulocytes: 0 %
Lymphocytes Relative: 16 %
Lymphs Abs: 1.7 10*3/uL (ref 0.7–4.0)
MCH: 30.7 pg (ref 26.0–34.0)
MCHC: 30.9 g/dL (ref 30.0–36.0)
MCV: 99.4 fL (ref 80.0–100.0)
Monocytes Absolute: 0.7 10*3/uL (ref 0.1–1.0)
Monocytes Relative: 7 %
Neutro Abs: 7.8 10*3/uL — ABNORMAL HIGH (ref 1.7–7.7)
Neutrophils Relative %: 77 %
Platelets: 123 10*3/uL — ABNORMAL LOW (ref 150–400)
RBC: 3.29 MIL/uL — ABNORMAL LOW (ref 4.22–5.81)
RDW: 18.5 % — ABNORMAL HIGH (ref 11.5–15.5)
WBC: 10.2 10*3/uL (ref 4.0–10.5)
nRBC: 0 % (ref 0.0–0.2)

## 2022-01-21 LAB — ECHOCARDIOGRAM LIMITED
Height: 72 in
Weight: 3156.99 oz

## 2022-01-21 MED ORDER — INSULIN ASPART PROT & ASPART (70-30 MIX) 100 UNIT/ML ~~LOC~~ SUSP
8.0000 [IU] | Freq: Two times a day (BID) | SUBCUTANEOUS | Status: DC
  Administered 2022-01-21 – 2022-01-28 (×10): 8 [IU] via SUBCUTANEOUS
  Filled 2022-01-21 (×3): qty 10

## 2022-01-21 MED ORDER — DARBEPOETIN ALFA 60 MCG/0.3ML IJ SOSY
60.0000 ug | PREFILLED_SYRINGE | INTRAMUSCULAR | Status: DC
  Administered 2022-01-25: 60 ug via INTRAVENOUS
  Filled 2022-01-21 (×2): qty 0.3

## 2022-01-21 MED ORDER — CHLORHEXIDINE GLUCONATE CLOTH 2 % EX PADS
6.0000 | MEDICATED_PAD | Freq: Once | CUTANEOUS | Status: AC
  Administered 2022-01-21: 6 via TOPICAL

## 2022-01-21 MED ORDER — AMOXICILLIN 500 MG PO CAPS
500.0000 mg | ORAL_CAPSULE | Freq: Two times a day (BID) | ORAL | Status: DC
  Administered 2022-01-21 – 2022-01-22 (×2): 500 mg via ORAL
  Filled 2022-01-21 (×2): qty 1

## 2022-01-21 MED ORDER — CHLORHEXIDINE GLUCONATE CLOTH 2 % EX PADS: 6.0000 | MEDICATED_PAD | Freq: Once | CUTANEOUS | Status: DC

## 2022-01-21 MED ORDER — PROSOURCE PLUS PO LIQD
30.0000 mL | Freq: Two times a day (BID) | ORAL | Status: DC
  Administered 2022-01-21 – 2022-02-12 (×12): 30 mL via ORAL
  Filled 2022-01-21 (×22): qty 30

## 2022-01-21 NOTE — Progress Notes (Addendum)
I have reviewed his chart.  I also personally read the echocardiogram.  The LVEF is further slightly reduced compared to previous.  Limited echocardiogram hence may be inaccurate.  There appears to be flail tricuspid valve leaflet.  Severe tricuspid regurgitation.  PA pressures has increased compared to previous.  There also appears to be a vegetation on the anterior mitral leaflet. ? ?In spite of findings suggestive of vegetation/endocarditis, cultures so far have been negative.  I was surprised to see that the spine aspiration so far is negative for any growth.  Patient has end-stage renal disease and is on hemodialysis, seeding during dialysis is most probable etiology.  He also has peripheral arterial disease and decub ulcers with open wounds. ? ?His surgical risk from cardiac standpoint is high risk.  However he is going for back surgery due to severe pain and from compassionate reasons. ? ?I called his wife and daughter, Thayer Headings and [name] and had a long discussion for about 20 minutes over the telephone regarding all the high risk features that he has for going through surgery. ? ?I explained to them that he is high risk for surgery both from cardiac standpoint and also from systemic standpoint in general.  That they should expect high mortality and morbidity from surgery. ? ?We are doing surgery from compassionate standpoint.  1 option I also gave them is to start him on comfort measures with high-dose narcotics to relieve him of his pain.  His long-term survival is extremely grim, several of <50% chance at 6 months. ? ?They do understand the high risk nature, they will further discuss this with Mr. Hosang today, they are on the way to the hospital.  In view of all of his medical issues, comfort measures would be very appropriate as well.  I have communicated this with Dr. Eulogio Bear. ? ?Total time spent on the telephone encounter is 50 minutes. ? ? ?Adrian Prows, MD, [facility] ?01/21/2022, 11:59 AM ?Office:  [phone] ?Fax: [phone] ?Pager: [phone]  ?

## 2022-01-21 NOTE — Assessment & Plan Note (Signed)
-   LVEF is further slightly reduced compared to previous.  Limited echocardiogram hence may be inaccurate.  There appears to be flail tricuspid valve leaflet.  Severe tricuspid regurgitation.  PA pressures has increased compared to previous.  There also appears to be a vegetation on the anterior mitral leaflet.  ?

## 2022-01-21 NOTE — Progress Notes (Addendum)
?[facility] ?Progress Note  ? ?Subjective: Seen in room. No C/O pain as long as he is lying still.  OR tomorrow. Next HD 01/23/2022 ? ?Objective ?Vitals:  ? 01/20/22 1605 01/20/22 2105 01/21/22 0508 01/21/22 3009  ?BP: 127/71 (!) 111/58 111/68 109/66  ?Pulse: 68 77 (!) 105 88  ?Resp: '16 20 16 18  '$ ?Temp: 97.7 ?F (36.5 ?C) 98.6 ?F (37 ?C) 98.4 ?F (36.9 ?C) 98 ?F (36.7 ?C)  ?TempSrc: Oral Oral Oral Oral  ?SpO2:  93% 94% 95%  ?Weight:   90.2 kg   ?Height:      ? ?Physical Exam ?General: Chronically ill appearing male in NAD.  ?Heart: S1,S2 RRR. No M/R/G ?Lungs: CTAB ?Abdomen: obese, NT. NABS ?Extremities:BLE wrapped in una boots.  Truly cannot assess edema. R BKA, L toe amps.  ?Dialysis Access: R AVF +T/B ? ? ?Additional Objective ?Labs: ?Basic Metabolic Panel: ?Recent Labs  ?Lab 01/16/22 ?0333 01/17/22 ?0232 01/18/22 ?2330 01/19/22 ?1227 01/20/22 ?0203  ?NA 130*   < > 133* 134* 135  ?K 4.3   < > 5.1 4.3 4.2  ?CL 95*   < > 97* 95* 98  ?CO2 24   < > '25 22 28  '$ ?GLUCOSE 181*   < > 202* 168* 101*  ?BUN 34*   < > 39* 27* 28*  ?CREATININE 3.99*   < > 3.56* 2.82* 3.15*  ?CALCIUM 8.5*   < > 8.5* 8.2* 8.2*  ?PHOS 4.9*  --  4.9*  --  4.1  ? < > = values in this interval not displayed.  ? ?Liver Function Tests: ?Recent Labs  ?Lab 01/20/22 ?0203  ?ALBUMIN 1.9*  ? ?No results for input(s): LIPASE, AMYLASE in the last 168 hours. ?CBC: ?Recent Labs  ?Lab 01/17/22 ?0232 01/18/22 ?0337 01/19/22 ?1651 01/20/22 ?0203 01/21/22 ?0762  ?WBC 9.2 9.5 10.3 9.9 10.2  ?NEUTROABS 7.4 6.2 8.1* 7.1 7.8*  ?HGB 9.5* 9.9* 10.3* 9.7* 10.1*  ?HCT 30.4* 31.8* 34.3* 31.5* 32.7*  ?MCV 98.1 100.3* 100.0 99.7 99.4  ?PLT 109* 121* 131* 115* 123*  ? ?Blood Culture ?   ?Component Value Date/Time  ? SDES WOUND 01/19/2022 1452  ? SPECREQUEST L3/L4 [place] ASPIRATION 01/19/2022 1452  ? CULT  01/19/2022 1452  ?  NO GROWTH < 24 HOURS ?Performed at [facility], [place] [address], [place], [place] [address] ?  ? REPTSTATUS PENDING 01/19/2022 1452   ? ? ?Cardiac Enzymes: ?No results for input(s): CKTOTAL, CKMB, CKMBINDEX, TROPONINI in the last 168 hours. ?CBG: ?Recent Labs  ?Lab 01/20/22 ?1130 01/20/22 ?1601 01/20/22 ?1637 01/20/22 ?2140 01/21/22 ?0713  ?GLUCAP 90 63* 95 198* 149*  ? ?Iron Studies: No results for input(s): IRON, TIBC, TRANSFERRIN, FERRITIN in the last 72 hours. ?'@lablastinr3'$ @ ?Studies/Results: ?IR LUMBAR DISC ASPIRATION W/IMG GUIDE ? ?Result Date: 01/19/2022 ?INDICATION: Back pain with MRI of the lumbar spine suggestive of L2-3 discitis/osteomyelitis. Fine-needle aspiration required for culture. EXAM: FLUOROSCOPY GUIDED L2-3 INTERVERTEBRAL DISC ASPIRATION MEDICATIONS: The patient is currently admitted to the hospital and receiving intravenous antibiotics. The antibiotics were administered within an appropriate time frame prior to the initiation of the procedure. ANESTHESIA/SEDATION: The procedure was performed under general anesthesia. COMPLICATIONS: None immediate. PROCEDURE: Informed written consent was obtained from the patient after a thorough discussion of the procedural risks, benefits and alternatives. All questions were addressed. Maximal Sterile Barrier Technique was utilized including caps, mask, sterile gowns, sterile gloves, sterile drape, hand hygiene and skin antiseptic. A timeout was performed prior to the initiation  of the procedure. The patient was placed in prone position on the angiography table. The lumbar spine region was prepped and draped in a sterile fashion. Under fluoroscopy, the L2-3 intervertebral disc was delineated and the skin area was marked. The skin was infiltrated with a 1% Lidocaine approximately 5 cm lateral to the spinous process projection on the left. Subsequently, using a left posterolateral approach, an 18-gauge trocar needle was advanced into the L2-3 disc space. A 22-gauge spinal needle was then coaxially advanced into the disc. Two fine-needle aspiration samples were obtained with blood tinged fluid  and whitish debris into syringes. The syringes were capped and sent to the laboratory for culture. The needle was later removed. The access site was cleaned and covered with a sterile bandage. IMPRESSION: Successful fluoroscopy guided L2-3 intervertebral disc fine-needle aspiration. Samples collected were sent to the laboratory for microbiology analysis. Electronically Signed   By: Pedro Earls M.D.   On: 01/19/2022 15:48   ?Medications: ? ? amoxicillin  500 mg Oral Q2000  ? atorvastatin  20 mg Oral QHS  ? calcitRIOL  0.25 mcg Oral Daily  ? Chlorhexidine Gluconate Cloth  6 each Topical Daily  ? doxycycline  100 mg Oral Q12H  ? furosemide  80 mg Oral Once per day on Sun Mon Wed Fri  ? insulin aspart  0-6 Units Subcutaneous TID WC  ? insulin aspart protamine- aspart  16 Units Subcutaneous BID WC  ? lidocaine  1 patch Transdermal Q24H  ? metoprolol tartrate  25 mg Oral BID  ? multivitamin with minerals  1 tablet Oral Once per day on Mon Thu  ? pantoprazole  40 mg Oral Daily  ? ? ? ?Dialysis Orders: ?TTS - GOC ?4hrs26mn, BFR 400, DFR 800,  EDW 94kg, 3K/ 2.5Ca ?No Systemic Heparin ?Mircera 200 mcg IV q2wks - last 01/11/22 ?Calcitriol 0.239m PO qHD - last 01/13/22 ?  ?Assessment/Plan: ?1 Lumbar back pain/Radiculopathy: Followed by Spine Surgery Dr. CrSaintclair HalstedMR Lumbar Spine completed: showed fractures L2-L3 with fluid-? Trauma vs. Infection. Neurosurgeon recommending surgical decompression. IR consulted-went for L2-3 disc aspiration 01/19/2022. Planning for OR 01/22/2022. Continue pain management and Pt/OT ?2 ESRD - on HD TTS. HD today off schedule D/T census and staffing. Next HD 01/23/2022. ?3  Hypertension/volume  - Currently not volume overloaded. BP stable. Very much under OP EDW if wt is accurate. Lower EDW on discharge. UF as tolerated.  ?4. Anemia of CKD - Hgb 10.1. ESA due 01/25/2022. Will order.  ?5. Secondary Hyperparathyroidism - PO4 at goal. Continue VDRA. ?6. Nutrition - Renal diet on fluid  restriction. Albumin very low. Start protein supps, renal vitamins  ?7. DMT2-per primary ?8. 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 Follows with cards. ? ?RiJimmye NormanBrown NP-C ?01/21/2022, 10:17 AM  ?[facility] ?[phone] ? ?  ? ?

## 2022-01-21 NOTE — Progress Notes (Signed)
PROGRESS NOTE    RAFAY DAHAN  EHU:[id number] DOB: [birth year] DOA: 01/13/2022 PCP: [name], MD    Brief Narrative:  Alexander [name] is a 61 y.o. male with medical history significant for ESRD on HD on TTS, HTN, HLD, PVD status post right BKA and left midfoot amputation, Afib on Eliquis, CAD, DM2, and status post pacemaker, presented to the ER with worsening intractable low back pain over the last couple of days with associated left lower extremity radiation and radiculopathy with associated spasm and weakness, denies any urinary or stool incontinence. He has been having chronic low back pain with occasional radiation to both lower extremities. Pt has been followed by spine surgery and was scheduled for LS spine MRI on 02/07/2022.  Plan is for disc aspiration and if no infection then patient will have surgical decompression.  Aspiration done 3/10.  Surgery planned for Monday.   Appreciate Dr. Irven Shelling help with [place].  Patient wishes to pursue surgery even though he is high risk    Assessment and Plan: * Lumbar back pain with radiculopathy affecting left lower extremity CRP, ESR elevated CT noted moderate neuroforaminal stenosis at L2-L3 and L5-S1 MRI lumbar spine showed subacute fractures L2-L3 disc, partially treated discitis/osteomyelitis is a consideration, severe multifactorial spinal stenosis at L2-3 Followed by spine surgery Dr Saintclair Halsted, rec disc aspiration by IR to rule out any discitis and plan for surgical decompression IR consulted for disc aspiration to rule out any infection done 3/10- culture negative Completed IV decadron Pain management as able- appeared to improve with robaxin PT/OT- recommending SNF  ESRD (end stage renal disease) ([place]) HD- T/T/S Nephrology consulted  PAF (paroxysmal atrial fibrillation) ([place]) Continue Lopressor and hold Eliquis due to upcoming procedure  Essential hypertension, benign Continue Lopressor- holding parameters  Dyslipidemia - We will  continue statin therapy.  Type 2 diabetes mellitus with chronic kidney disease, with long-term current use of insulin (HCC) Last A1c 6.8 SSI, NovoLog 70/30 twice daily at a lower dose, hypoglycemic protocol, Accu-Cheks  GERD without esophagitis Continue PPI therapy.  Endocarditis - LVEF is further slightly reduced compared to previous.  Limited echocardiogram hence may be inaccurate.  There appears to be flail tricuspid valve leaflet.  Severe tricuspid regurgitation.  PA pressures has increased compared to previous.  There also appears to be a vegetation on the anterior mitral leaflet.   Below-knee amputation of right lower extremity ([place]) At outpatient visit on 01/10/2022 with orthopedics, noted BLE with significant weeping, edema with induration, both noted with distal maceration with L LE noted open ulceration Concern for ischemic changes, placed in Unna and Dynaflex compression wraps bilaterally Consulted [name], we will continue with serial compression wraps and outpatient follow-up  Pressure injury of skin Pressure Injury 01/14/22 Ischial tuberosity Right Stage 3 -  Full thickness tissue loss. Subcutaneous fat may be visible but bone, tendon or muscle are NOT exposed. (Active)  01/14/22 0321  Location: Ischial tuberosity  Location Orientation: Right  Staging: Stage 3 -  Full thickness tissue loss. Subcutaneous fat may be visible but bone, tendon or muscle are NOT exposed.  Wound Description (Comments):   Present on Admission: Yes     Pressure Injury 01/14/22 Buttocks Right Stage 2 -  Partial thickness loss of dermis presenting as a shallow open injury with a red, pink wound bed without slough. (Active)  01/14/22 0322  Location: Buttocks  Location Orientation: Right  Staging: Stage 2 -  Partial thickness loss of dermis presenting as a shallow  open injury with a red, pink wound bed without slough.  Wound Description (Comments):   Present on Admission: Yes     Pressure Injury  01/14/22 Sacrum Mid Stage 2 -  Partial thickness loss of dermis presenting as a shallow open injury with a red, pink wound bed without slough. (Active)  01/14/22 0322  Location: Sacrum  Location Orientation: Mid  Staging: Stage 2 -  Partial thickness loss of dermis presenting as a shallow open injury with a red, pink wound bed without slough.  Wound Description (Comments):   Present on Admission: Yes     Pressure Injury 01/14/22 Buttocks Left Stage 2 -  Partial thickness loss of dermis presenting as a shallow open injury with a red, pink wound bed without slough. (Active)  01/14/22 0323  Location: Buttocks  Location Orientation: Left  Staging: Stage 2 -  Partial thickness loss of dermis presenting as a shallow open injury with a red, pink wound bed without slough.  Wound Description (Comments):   Present on Admission: Yes     Pressure Injury 01/14/22 Buttocks Posterior;Proximal;Right Stage 2 -  Partial thickness loss of dermis presenting as a shallow open injury with a red, pink wound bed without slough. (Active)  01/14/22 0324  Location: Buttocks  Location Orientation: Posterior;Proximal;Right  Staging: Stage 2 -  Partial thickness loss of dermis presenting as a shallow open injury with a red, pink wound bed without slough.  Wound Description (Comments):   Present on Admission: Yes       Pacemaker infection ([place]) Follows with ID outpatient -appreciated ID consult--  resume abx after aspiration -last progress note: Plan to transition to doxy/amoxicillin 100 mg po bid and 500 mg po qhs -- indefinitely -limited echo as above          DVT prophylaxis:     Code Status: Full Code Family Communication:  multiple at bedside  Disposition Plan:  Level of care: Med-Surg Status is: Inpatient Remains inpatient appropriate because: needs urgery    Consultants:  NS Palliative care nephrology   Subjective: No SOB, pain with movement still -understands that he is high  risk for surgery  Objective: Vitals:   01/20/22 1605 01/20/22 2105 01/21/22 0508 01/21/22 0938  BP: 127/71 (!) 111/58 111/68 109/66  Pulse: 68 77 (!) 105 88  Resp: _0 Temp: 97.7 F (36.5 C) 98.6 F (37 C) 98.4 F (36.9 C) 98 F (36.7 C)  TempSrc: Oral Oral Oral Oral  SpO2:  93% 94% 95%  Weight:   90.2 kg   Height:        Intake/Output Summary (Last 24 hours) at 01/21/2022 1601 Last data filed at 01/21/2022 1309 Gross per 24 hour  Intake 860 ml  Output 0 ml  Net 860 ml   Filed Weights   01/20/22 1241 01/20/22 1525 01/21/22 0508  Weight: 90.7 kg 89.5 kg 90.2 kg    Examination:   General: Appearance:     Overweight male in no acute distress     Lungs:     respirations unlabored  Heart:    Normal heart rate.    MS:   Below knee amputation of right lower extremity is noted. Amputation of left foot is noted. Left  amputation noted. Right  amputation noted.    Neurologic:   Awake, alert, oriented x 3. No apparent focal neurological           defect.        Data Reviewed: I have  personally reviewed following labs and imaging studies  CBC: Recent Labs  Lab 01/17/22 0232 01/18/22 0337 01/19/22 1651 01/20/22 0203 01/21/22 0053  WBC 9.2 9.5 10.3 9.9 10.2  NEUTROABS 7.4 6.2 8.1* 7.1 7.8*  HGB 9.5* 9.9* 10.3* 9.7* 10.1*  HCT 30.4* 31.8* 34.3* 31.5* 32.7*  MCV 98.1 100.3* 100.0 99.7 99.4  PLT 109* 121* 131* 115* 825*   Basic Metabolic Panel: Recent Labs  Lab 01/16/22 0333 01/17/22 0232 01/18/22 0337 01/19/22 1227 01/20/22 0203  NA 130* 133* 133* 134* 135  K 4.3 3.9 5.1 4.3 4.2  CL 95* 98 97* 95* 98  CO2 _0 GLUCOSE 181* 241* 202* 168* 101*  BUN 34* 27* 39* 27* 28*  CREATININE 3.99* 2.92* 3.56* 2.82* 3.15*  CALCIUM 8.5* 8.3* 8.5* 8.2* 8.2*  PHOS 4.9*  --  4.9*  --  4.1   GFR: Estimated Creatinine Clearance: 26.3 mL/min (A) (by C-G formula based on SCr of 3.15 mg/dL (H)). Liver Function Tests: Recent Labs  Lab 01/20/22 0203   ALBUMIN 1.9*   No results for input(s): LIPASE, AMYLASE in the last 168 hours. No results for input(s): AMMONIA in the last 168 hours. Coagulation Profile: Recent Labs  Lab 01/17/22 0232 01/18/22 0337  INR 1.9* 1.4*   Cardiac Enzymes: No results for input(s): CKTOTAL, CKMB, CKMBINDEX, TROPONINI in the last 168 hours. BNP (last 3 results) No results for input(s): PROBNP in the last 8760 hours. HbA1C: No results for input(s): HGBA1C in the last 72 hours. CBG: Recent Labs  Lab 01/20/22 1601 01/20/22 1637 01/20/22 2140 01/21/22 0713 01/21/22 1123  GLUCAP 63* 95 198* 149* 136*   Lipid Profile: No results for input(s): CHOL, HDL, LDLCALC, TRIG, CHOLHDL, LDLDIRECT in the last 72 hours. Thyroid Function Tests: No results for input(s): TSH, T4TOTAL, FREET4, T3FREE, THYROIDAB in the last 72 hours. Anemia Panel: No results for input(s): VITAMINB12, FOLATE, FERRITIN, TIBC, IRON, RETICCTPCT in the last 72 hours. Sepsis Labs: No results for input(s): PROCALCITON, LATICACIDVEN in the last 168 hours.  Recent Results (from the past 240 hour(s))  Resp Panel by RT-PCR (Flu A&B, Covid) Nasopharyngeal Swab     Status: None   Collection Time: 01/14/22  1:13 AM   Specimen: Nasopharyngeal Swab; Nasopharyngeal(NP) swabs in vial transport medium  Result Value Ref Range Status   SARS Coronavirus 2 by RT PCR NEGATIVE NEGATIVE Final    Comment: (NOTE) SARS-CoV-2 target nucleic acids are NOT DETECTED.  The SARS-CoV-2 RNA is generally detectable in upper respiratory specimens during the acute phase of infection. The lowest concentration of SARS-CoV-2 viral copies this assay can detect is 138 copies/mL. A negative result does not preclude SARS-Cov-2 infection and should not be used as the sole basis for treatment or other patient management decisions. A negative result may occur with  improper specimen collection/handling, submission of specimen other than nasopharyngeal swab, presence of viral  mutation(s) within the areas targeted by this assay, and inadequate number of viral copies(<138 copies/mL). A negative result must be combined with clinical observations, patient history, and epidemiological information. The expected result is Negative.  Fact Sheet for Patients:  EntrepreneurPulse.com.au  Fact Sheet for Healthcare Providers:  IncredibleEmployment.be  This test is no t yet approved or cleared by the [place] FDA and  has been authorized for detection and/or diagnosis of SARS-CoV-2 by FDA under an Emergency Use Authorization (EUA). This EUA will remain  in effect (meaning this test can be used) for the duration of the COVID-19  declaration under Section 564(b)(1) of the Act, 21 U.S.C.section 360bbb-3(b)(1), unless the authorization is terminated  or revoked sooner.       Influenza A by PCR NEGATIVE NEGATIVE Final   Influenza B by PCR NEGATIVE NEGATIVE Final    Comment: (NOTE) The Xpert Xpress SARS-CoV-2/FLU/RSV plus assay is intended as an aid in the diagnosis of influenza from Nasopharyngeal swab specimens and should not be used as a sole basis for treatment. Nasal washings and aspirates are unacceptable for Xpert Xpress SARS-CoV-2/FLU/RSV testing.  Fact Sheet for Patients: EntrepreneurPulse.com.au  Fact Sheet for Healthcare Providers: IncredibleEmployment.be  This test is not yet approved or cleared by the [place] FDA and has been authorized for detection and/or diagnosis of SARS-CoV-2 by FDA under an Emergency Use Authorization (EUA). This EUA will remain in effect (meaning this test can be used) for the duration of the COVID-19 declaration under Section 564(b)(1) of the Act, 21 U.S.C. section 360bbb-3(b)(1), unless the authorization is terminated or revoked.  Performed at [facility], [place] [address] [place], [place] [address]   MRSA Next Gen by PCR, Nasal      Status: None   Collection Time: 01/14/22  7:18 AM   Specimen: Nasal Mucosa; Nasal Swab  Result Value Ref Range Status   MRSA by PCR Next Gen NOT DETECTED NOT DETECTED Final    Comment: (NOTE) The GeneXpert MRSA Assay (FDA approved for NASAL specimens only), is one component of a comprehensive MRSA colonization surveillance program. It is not intended to diagnose MRSA infection nor to guide or monitor treatment for MRSA infections. Test performance is not FDA approved in patients less than 64 years old. Performed at [facility], [place] [address], [place], [place] [address]   Culture, blood (routine x 2)     Status: None   Collection Time: 01/14/22  3:24 PM   Specimen: BLOOD LEFT WRIST  Result Value Ref Range Status   Specimen Description BLOOD LEFT WRIST  Final   Special Requests   Final    BOTTLES DRAWN AEROBIC AND ANAEROBIC Blood Culture results may not be optimal due to an inadequate volume of blood received in culture bottles   Culture   Final    NO GROWTH 5 DAYS Performed at [facility], [place] [address], [place], [place] [address]    Report Status 01/19/2022 FINAL  Final  Culture, blood (routine x 2)     Status: None   Collection Time: 01/14/22  3:33 PM   Specimen: BLOOD LEFT FOREARM  Result Value Ref Range Status   Specimen Description BLOOD LEFT FOREARM  Final   Special Requests   Final    BOTTLES DRAWN AEROBIC AND ANAEROBIC Blood Culture adequate volume   Culture   Final    NO GROWTH 5 DAYS Performed at [facility], [place] [address], [place], [place] [address]    Report Status 01/19/2022 FINAL  Final  Surgical pcr screen     Status: None   Collection Time: 01/19/22 12:05 PM   Specimen: Nasal Mucosa; Nasal Swab  Result Value Ref Range Status   MRSA, PCR NEGATIVE NEGATIVE Final   Staphylococcus aureus NEGATIVE NEGATIVE Final    Comment: (NOTE) The Xpert SA Assay (FDA approved for NASAL specimens in patients 52 years of age and older), is one  component of a comprehensive surveillance program. It is not intended to diagnose infection nor to guide or monitor treatment. Performed at [facility], [place]  [address] [place], [place] [address]   Aerobic/Anaerobic Culture w Gram Stain (surgical/deep wound)     Status: None (Preliminary result)   Collection Time: 01/19/22  2:52 PM   Specimen: PATH Cytology FNA; Tissue  Result Value Ref Range Status   Specimen Description WOUND  Final   Special Requests L3/L4 DISC ASPIRATION  Final   Gram Stain   Final    RARE WBC PRESENT,BOTH PMN AND MONONUCLEAR NO ORGANISMS SEEN    Culture   Final    NO GROWTH 2 DAYS NO ANAEROBES ISOLATED; CULTURE IN PROGRESS FOR 5 DAYS Performed at [facility], [address] [place], [place] [address]    Report Status PENDING  Incomplete         Radiology Studies: ECHOCARDIOGRAM LIMITED  Result Date: 01/21/2022    ECHOCARDIOGRAM LIMITED REPORT   Patient Name:   Alexander [name] [facility] Date of Exam: 01/20/2022 Medical Rec #:  [id number]     Height:       72.0 in Accession #:    [id number]    Weight:       200.0 lb Date of Birth:  February 10, 1961     BSA:          2.130 m Patient Age:    35 years      BP:           117/65 mmHg Patient Gender: M             HR:           136 bpm. Exam Location:  Inpatient Procedure: Limited Echo Indications:     endocarditis  History:         Patient has prior history of Echocardiogram examinations, most                  recent 10/27/2021. CAD, Pacemaker, end stage renal disease;                  Risk Factors:Hypertension and Diabetes.  Sonographer:     Johny Chess RDCS Referring Phys:  [place] Diagnosing Phys: Adrian Prows MD IMPRESSIONS  1. Left ventricular ejection fraction, by estimation, is 30 to 35%. The left ventricle has moderately decreased function. The left ventricle demonstrates global hypokinesis. There is mild left ventricular hypertrophy. Left ventricular diastolic function  could not be evaluated.  2. Right  ventricular systolic function is hyperdynamic. The right ventricular size is normal. There is moderately elevated pulmonary artery systolic pressure. The estimated right ventricular systolic pressure is 44.3 mmHg.  3. Mild mitral valve regurgitation.  4. There is a vegetation noted on the TV leaflet and septal leaflet is flail. Cannot exclude presence of vegetation on the pacemaker lead. . The tricuspid valve is degenerative. Tricuspid valve regurgitation is severe.  5. The aortic valve is normal in structure. Aortic valve regurgitation is not visualized.  6. The inferior vena cava is dilated in size with <50% respiratory variability, suggesting right atrial pressure of 15 mmHg. Comparison(s): Compared to 10/26/21, no change in vegetation on the right sided valves. Flail septal leaflet is new. MV vegetation is new. LVEF has further decreased from 40-45% to 30-35%. FINDINGS  Left Ventricle: Left ventricular ejection fraction, by estimation, is 30 to 35%. The left ventricle has moderately decreased function. The left ventricle demonstrates global hypokinesis. The left ventricular internal cavity size was small. There is mild  left ventricular hypertrophy. Abnormal (paradoxical) septal motion, consistent with RV pacemaker. Left ventricular diastolic function  could not be evaluated. Right Ventricle: The right ventricular size is normal. Right ventricular systolic function is hyperdynamic. There is moderately elevated pulmonary artery systolic pressure. The tricuspid regurgitant velocity is 2.95 m/s, and with an assumed right atrial pressure of 15 mmHg, the estimated right ventricular systolic pressure is 09.6 mmHg. Left Atrium: Left atrial size was normal in size. Right Atrium: Right atrial size was normal in size. Pericardium: There is no evidence of pericardial effusion. Mitral Valve: There is mild calcification of the mitral valve leaflet(s). Normal mobility of the mitral valve leaflets. Mild mitral annular  calcification. Mild mitral valve regurgitation. Tricuspid Valve: There is a vegetation noted on the TV leaflet and septal leaflet is flail. Cannot exclude presence of vegetation on the pacemaker lead. The tricuspid valve is degenerative in appearance. Tricuspid valve regurgitation is severe. There is holosystolic prolapse of the tricuspid. Aortic Valve: The aortic valve is normal in structure. Aortic valve regurgitation is not visualized. Aorta: The aortic root is normal in size and structure. Venous: The inferior vena cava is dilated in size with less than 50% respiratory variability, suggesting right atrial pressure of 15 mmHg. IAS/Shunts: The interatrial septum was not assessed. Additional Comments: A device lead is visualized. IVC IVC diam: 2.30 cm TRICUSPID VALVE TR Peak grad:   34.8 mmHg TR Vmax:        295.00 cm/s Adrian Prows MD Electronically signed by Adrian Prows MD Signature Date/Time: 01/21/2022/11:08:54 AM    Final         Scheduled Meds:  (feeding supplement) PROSource Plus  30 mL Oral BID BM   amoxicillin  500 mg Oral Q12H   atorvastatin  20 mg Oral QHS   calcitRIOL  0.25 mcg Oral Daily   Chlorhexidine Gluconate Cloth  6 each Topical Daily   [START ON 01/25/2022] darbepoetin (ARANESP) injection - DIALYSIS  60 mcg Intravenous Q Thu-HD   doxycycline  100 mg Oral Q12H   furosemide  80 mg Oral Once per day on Sun Mon Wed Fri   insulin aspart  0-6 Units Subcutaneous TID WC   insulin aspart protamine- aspart  8 Units Subcutaneous BID WC   lidocaine  1 patch Transdermal Q24H   metoprolol tartrate  25 mg Oral BID   multivitamin with minerals  1 tablet Oral Once per day on Mon Thu   pantoprazole  40 mg Oral Daily   Continuous Infusions:   LOS: 8 days    Time spent: 75 minutes spent on chart review, discussion with nursing staff, consultants, updating family and interview/physical exam; more than 50% of that time was spent in counseling and/or coordination of care.    Geradine Girt,  DO [facility] Available via Epic secure chat 7am-7pm After these hours, please refer to coverage provider listed on [email or web address] 01/21/2022, 4:01 PM

## 2022-01-21 NOTE — Progress Notes (Signed)
PHARMACY NOTE:  ANTIMICROBIAL RENAL DOSAGE ADJUSTMENT ? ?Current antimicrobial regimen includes a mismatch between antimicrobial dosage and estimated renal function.  As per policy approved by the Pharmacy & Therapeutics and Medical Executive Committees, the antimicrobial dosage will be adjusted accordingly. ? ?Current antimicrobial dosage:  amoxicillin 500 mg PO Q24H  ? ?Indication: Infective Endocarditis Suppression  ? ?Renal Function: ? ?Estimated Creatinine Clearance: 26.3 mL/min (A) (by C-G formula based on SCr of 3.15 mg/dL (H)). ?'[]'$      On intermittent HD, scheduled: ?'[]'$      On CRRT ?   ?Antimicrobial dosage has been changed to:  amoxicillin 500 mg PO Q12H ? ?Adria Dill, PharmD ?PGY-1 Acute Care Resident  ?01/21/2022 1:54 PM  ? ?

## 2022-01-22 ENCOUNTER — Encounter (HOSPITAL_COMMUNITY)
Admission: EM | Disposition: A | Discharge status: Skilled Nursing Facility | Payer: Self-pay | Source: Home / Self Care | Attending: Internal Medicine

## 2022-01-22 ENCOUNTER — Encounter (HOSPITAL_COMMUNITY): Payer: Self-pay | Admitting: Neuroradiology

## 2022-01-22 DIAGNOSIS — T827XXD Infection and inflammatory reaction due to other cardiac and vascular devices, implants and grafts, subsequent encounter: Secondary | ICD-10-CM

## 2022-01-22 LAB — CBC WITH DIFFERENTIAL/PLATELET
Abs Immature Granulocytes: 0.03 10*3/uL (ref 0.00–0.07)
Basophils Absolute: 0 10*3/uL (ref 0.0–0.1)
Basophils Relative: 0 %
Eosinophils Absolute: 0.1 10*3/uL (ref 0.0–0.5)
Eosinophils Relative: 1 %
HCT: 30.4 % — ABNORMAL LOW (ref 39.0–52.0)
Hemoglobin: 9.5 g/dL — ABNORMAL LOW (ref 13.0–17.0)
Immature Granulocytes: 0 %
Lymphocytes Relative: 26 %
Lymphs Abs: 2.4 10*3/uL (ref 0.7–4.0)
MCH: 30.7 pg (ref 26.0–34.0)
MCHC: 31.3 g/dL (ref 30.0–36.0)
MCV: 98.4 fL (ref 80.0–100.0)
Monocytes Absolute: 0.9 10*3/uL (ref 0.1–1.0)
Monocytes Relative: 9 %
Neutro Abs: 6.1 10*3/uL (ref 1.7–7.7)
Neutrophils Relative %: 64 %
Platelets: 120 10*3/uL — ABNORMAL LOW (ref 150–400)
RBC: 3.09 MIL/uL — ABNORMAL LOW (ref 4.22–5.81)
RDW: 17.7 % — ABNORMAL HIGH (ref 11.5–15.5)
WBC: 9.5 10*3/uL (ref 4.0–10.5)
nRBC: 0 % (ref 0.0–0.2)

## 2022-01-22 LAB — BASIC METABOLIC PANEL
Anion gap: 10 (ref 5–15)
BUN: 34 mg/dL — ABNORMAL HIGH (ref 8–23)
CO2: 26 mmol/L (ref 22–32)
Calcium: 8 mg/dL — ABNORMAL LOW (ref 8.9–10.3)
Chloride: 98 mmol/L (ref 98–111)
Creatinine, Ser: 3.23 mg/dL — ABNORMAL HIGH (ref 0.61–1.24)
GFR, Estimated: 21 mL/min — ABNORMAL LOW (ref >60)
Glucose, Bld: 112 mg/dL — ABNORMAL HIGH (ref 70–99)
Potassium: 3.2 mmol/L — ABNORMAL LOW (ref 3.5–5.1)
Sodium: 134 mmol/L — ABNORMAL LOW (ref 135–145)

## 2022-01-22 LAB — GLUCOSE, CAPILLARY
Glucose-Capillary: 124 mg/dL — ABNORMAL HIGH (ref 70–99)
Glucose-Capillary: 110 mg/dL — ABNORMAL HIGH (ref 70–99)
Glucose-Capillary: 151 mg/dL — ABNORMAL HIGH (ref 70–99)
Glucose-Capillary: 97 mg/dL (ref 70–99)

## 2022-01-22 SURGERY — LAMINECTOMY WITH POSTERIOR LATERAL ARTHRODESIS LEVEL 1
Anesthesia: General | Site: Back

## 2022-01-22 MED ORDER — DOCUSATE SODIUM 100 MG PO CAPS
100.0000 mg | ORAL_CAPSULE | Freq: Two times a day (BID) | ORAL | Status: DC
  Administered 2022-01-22 – 2022-01-31 (×18): 100 mg via ORAL
  Filled 2022-01-22 (×18): qty 1

## 2022-01-22 MED ORDER — MUSCLE RUB 10-15 % EX CREA
TOPICAL_CREAM | CUTANEOUS | Status: DC | PRN
  Administered 2022-01-22: 1 via TOPICAL
  Filled 2022-01-22: qty 85

## 2022-01-22 MED ORDER — APIXABAN 5 MG PO TABS
5.0000 mg | ORAL_TABLET | Freq: Two times a day (BID) | ORAL | Status: DC
  Administered 2022-01-22 – 2022-01-26 (×9): 5 mg via ORAL
  Filled 2022-01-22 (×9): qty 1

## 2022-01-22 MED ORDER — AMOXICILLIN 500 MG PO CAPS
500.0000 mg | ORAL_CAPSULE | ORAL | Status: DC
  Administered 2022-01-22 – 2022-02-14 (×24): 500 mg via ORAL
  Filled 2022-01-22 (×27): qty 1

## 2022-01-22 MED ORDER — HYDROMORPHONE HCL 2 MG PO TABS
4.0000 mg | ORAL_TABLET | ORAL | Status: DC | PRN
  Administered 2022-01-22 – 2022-01-23 (×5): 4 mg via ORAL
  Filled 2022-01-22 (×5): qty 2

## 2022-01-22 MED ORDER — BISACODYL 5 MG PO TBEC: 5.0000 mg | DELAYED_RELEASE_TABLET | Freq: Every day | ORAL | Status: DC | PRN

## 2022-01-22 MED ORDER — OXYCODONE HCL 5 MG PO TABS: 5.0000 mg | ORAL_TABLET | Freq: Four times a day (QID) | ORAL | Status: DC | PRN

## 2022-01-22 MED ORDER — METHOCARBAMOL 500 MG PO TABS
500.0000 mg | ORAL_TABLET | Freq: Three times a day (TID) | ORAL | Status: DC | PRN
  Administered 2022-01-22 – 2022-01-23 (×5): 500 mg via ORAL
  Filled 2022-01-22 (×5): qty 1

## 2022-01-22 MED ORDER — OXYCODONE HCL 5 MG PO TABS
5.0000 mg | ORAL_TABLET | Freq: Four times a day (QID) | ORAL | Status: DC | PRN
  Administered 2022-01-22 – 2022-01-23 (×3): 5 mg via ORAL
  Filled 2022-01-22 (×3): qty 1

## 2022-01-22 NOTE — Progress Notes (Signed)
[facility] Progress Note    Dialysis Orders: TTS - GOC 4hrs19mn, BFR 400, DFR 800,  EDW 94kg, 3K/ 2.5Ca No Systemic Heparin Mircera 200 mcg IV q2wks - last 01/11/22 Calcitriol 0.243m PO qHD - last 01/13/22   Assessment/Plan: 1 Lumbar back pain/Radiculopathy: Followed by Spine Surgery Dr. CrSaintclair HalstedMR Lumbar Spine completed: showed fractures L2-L3 with fluid-? Trauma vs. Infection. Neurosurgeon recommending surgical decompression. IR -  L2-3 disc aspiration 01/19/2022. Planning for OR 01/22/2022. Continue pain management and Pt/OT 2 ESRD - on HD TTS. HD today off schedule D/T census and staffing. Next HD 01/23/2022. 3  Hypertension/volume  - Currently not volume overloaded. BP stable. Very much under OP EDW if wt is accurate. Lower EDW on discharge. UF as tolerated.  4. Anemia of CKD - Hgb 10.1. ESA due 01/25/2022. Will order.  5. Secondary Hyperparathyroidism - PO4 at goal. Continue VDRA. 6. Nutrition - Renal diet on fluid restriction. Albumin very low. Start protein supps, renal vitamins  7. DMT2-per primary 8. 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 Follows with cards.  Subjective: Seen in room. No C/O pain as long as he is lying still.  OR today. Next HD 01/23/2022  Objective Vitals:   01/21/22 0938 01/21/22 1700 01/21/22 2237 01/22/22 0512  BP: 109/66 112/60 120/78 119/70  Pulse: 88 80 (!) 101 99  Resp: '18 16 18 18  '$ Temp: 98 F (36.7 C) 97.8 F (36.6 C) 98.2 F (36.8 C) 97.8 F (36.6 C)  TempSrc: Oral Oral Oral Oral  SpO2: 95% 96% 94% 95%  Weight:      Height:       Physical Exam General: Chronically ill appearing male in NAD.  Heart: S1,S2 RRR. No M/R/G Lungs: CTAB Abdomen: obese, NT. NABS Extremities:BLE wrapped in una boots.  Difficult to assess edema. R BKA, L toe amps.  Dialysis Access: R AVF +T/B   Additional Objective Labs: Basic Metabolic Panel: Recent Labs  Lab 01/16/22 0333 01/17/22 0232 01/18/22 0337 01/19/22 1227 01/20/22 0203  01/22/22 0417  NA 130*   < > 133* 134* 135 134*  K 4.3   < > 5.1 4.3 4.2 3.2*  CL 95*   < > 97* 95* 98 98  CO2 24   < > '25 22 28 26  '$ GLUCOSE 181*   < > 202* 168* 101* 112*  BUN 34*   < > 39* 27* 28* 34*  CREATININE 3.99*   < > 3.56* 2.82* 3.15* 3.23*  CALCIUM 8.5*   < > 8.5* 8.2* 8.2* 8.0*  PHOS 4.9*  --  4.9*  --  4.1  --    < > = values in this interval not displayed.   Liver Function Tests: Recent Labs  Lab 01/20/22 0203  ALBUMIN 1.9*   No results for input(s): LIPASE, AMYLASE in the last 168 hours. CBC: Recent Labs  Lab 01/18/22 0337 01/19/22 1651 01/20/22 0203 01/21/22 0053 01/22/22 0417  WBC 9.5 10.3 9.9 10.2 9.5  NEUTROABS 6.2 8.1* 7.1 7.8* 6.1  HGB 9.9* 10.3* 9.7* 10.1* 9.5*  HCT 31.8* 34.3* 31.5* 32.7* 30.4*  MCV 100.3* 100.0 99.7 99.4 98.4  PLT 121* 131* 115* 123* 120*   Blood Culture    Component Value Date/Time   SDES WOUND 01/19/2022 1452   SPECREQUEST L3/L4 DISC ASPIRATION 01/19/2022 1452   CULT  01/19/2022 1452    NO GROWTH 2 DAYS NO ANAEROBES ISOLATED; CULTURE IN PROGRESS FOR 5 DAYS Performed at [facility]  [place], [place] [address]    REPTSTATUS PENDING 01/19/2022 1452    Cardiac Enzymes: No results for input(s): CKTOTAL, CKMB, CKMBINDEX, TROPONINI in the last 168 hours. CBG: Recent Labs  Lab 01/20/22 2140 01/21/22 0713 01/21/22 1123 01/21/22 1618 01/21/22 2133  GLUCAP 198* 149* 136* 163* 88   Iron Studies: No results for input(s): IRON, TIBC, TRANSFERRIN, FERRITIN in the last 72 hours. '@lablastinr3'$ @ Studies/Results: ECHOCARDIOGRAM LIMITED  Result Date: 01/21/2022    ECHOCARDIOGRAM LIMITED REPORT   Patient Name:   Alexander [name] [facility] Date of Exam: 01/20/2022 Medical Rec #:  [id number]     Height:       72.0 in Accession #:    [id number]    Weight:       200.0 lb Date of Birth:  January 04, 1961     BSA:          2.130 m Patient Age:    61 years      BP:           117/65 mmHg Patient Gender: M             HR:           136  bpm. Exam Location:  Inpatient Procedure: Limited Echo Indications:     endocarditis  History:         Patient has prior history of Echocardiogram examinations, most                  recent 10/27/2021. CAD, Pacemaker, end stage renal disease;                  Risk Factors:Hypertension and Diabetes.  Sonographer:     Johny Chess RDCS Referring Phys:  [place] Diagnosing Phys: Adrian Prows MD IMPRESSIONS  1. Left ventricular ejection fraction, by estimation, is 30 to 35%. The left ventricle has moderately decreased function. The left ventricle demonstrates global hypokinesis. There is mild left ventricular hypertrophy. Left ventricular diastolic function  could not be evaluated.  2. Right ventricular systolic function is hyperdynamic. The right ventricular size is normal. There is moderately elevated pulmonary artery systolic pressure. The estimated right ventricular systolic pressure is 75.9 mmHg.  3. Mild mitral valve regurgitation.  4. There is a vegetation noted on the TV leaflet and septal leaflet is flail. Cannot exclude presence of vegetation on the pacemaker lead. . The tricuspid valve is degenerative. Tricuspid valve regurgitation is severe.  5. The aortic valve is normal in structure. Aortic valve regurgitation is not visualized.  6. The inferior vena cava is dilated in size with <50% respiratory variability, suggesting right atrial pressure of 15 mmHg. Comparison(s): Compared to 10/26/21, no change in vegetation on the right sided valves. Flail septal leaflet is new. MV vegetation is new. LVEF has further decreased from 40-45% to 30-35%. FINDINGS  Left Ventricle: Left ventricular ejection fraction, by estimation, is 30 to 35%. The left ventricle has moderately decreased function. The left ventricle demonstrates global hypokinesis. The left ventricular internal cavity size was small. There is mild  left ventricular hypertrophy. Abnormal (paradoxical) septal motion, consistent with RV pacemaker.  Left ventricular diastolic function could not be evaluated. Right Ventricle: The right ventricular size is normal. Right ventricular systolic function is hyperdynamic. There is moderately elevated pulmonary artery systolic pressure. The tricuspid regurgitant velocity is 2.95 m/s, and with an assumed right atrial pressure of 15 mmHg, the estimated right ventricular systolic pressure is 16.3 mmHg. Left Atrium: Left atrial size was normal in size. Right  Atrium: Right atrial size was normal in size. Pericardium: There is no evidence of pericardial effusion. Mitral Valve: There is mild calcification of the mitral valve leaflet(s). Normal mobility of the mitral valve leaflets. Mild mitral annular calcification. Mild mitral valve regurgitation. Tricuspid Valve: There is a vegetation noted on the TV leaflet and septal leaflet is flail. Cannot exclude presence of vegetation on the pacemaker lead. The tricuspid valve is degenerative in appearance. Tricuspid valve regurgitation is severe. There is holosystolic prolapse of the tricuspid. Aortic Valve: The aortic valve is normal in structure. Aortic valve regurgitation is not visualized. Aorta: The aortic root is normal in size and structure. Venous: The inferior vena cava is dilated in size with less than 50% respiratory variability, suggesting right atrial pressure of 15 mmHg. IAS/Shunts: The interatrial septum was not assessed. Additional Comments: A device lead is visualized. IVC IVC diam: 2.30 cm TRICUSPID VALVE TR Peak grad:   34.8 mmHg TR Vmax:        295.00 cm/s Adrian Prows MD Electronically signed by Adrian Prows MD Signature Date/Time: 01/21/2022/11:08:54 AM    Final    Medications:   (feeding supplement) PROSource Plus  30 mL Oral BID BM   amoxicillin  500 mg Oral Q12H   atorvastatin  20 mg Oral QHS   calcitRIOL  0.25 mcg Oral Daily   Chlorhexidine Gluconate Cloth  6 each Topical Daily   Chlorhexidine Gluconate Cloth  6 each Topical Once   [START ON 01/25/2022]  darbepoetin (ARANESP) injection - DIALYSIS  60 mcg Intravenous Q Thu-HD   doxycycline  100 mg Oral Q12H   furosemide  80 mg Oral Once per day on Sun Mon Wed Fri   insulin aspart  0-6 Units Subcutaneous TID WC   insulin aspart protamine- aspart  8 Units Subcutaneous BID WC   lidocaine  1 patch Transdermal Q24H   metoprolol tartrate  25 mg Oral BID   multivitamin with minerals  1 tablet Oral Once per day on Mon Thu   pantoprazole  40 mg Oral Daily

## 2022-01-22 NOTE — Progress Notes (Signed)
Subjective: ?Patient reports  reports pain is little bit better this morning unchanged left lower extremity function ? ?Objective: ?Vital signs in last 24 hours: ?Temp:  [97.8 ?F (36.6 ?C)-98.2 ?F (36.8 ?C)] 97.8 ?F (36.6 ?C) (03/13 5284) ?Pulse Rate:  [80-101] 99 (03/13 0512) ?Resp:  [16-18] 18 (03/13 0512) ?BP: (112-120)/(60-78) 119/70 (03/13 1324) ?SpO2:  [94 %-96 %] 95 % (03/13 0512) ? ?Intake/Output from previous day: ?03/12 0701 - 03/13 0700 ?In: 540 [P.O.:540] ?Out: 400 [Urine:400] ?Intake/Output this shift: ?No intake/output data recorded. ? ?2 out of 5 proximal left leg weakness stable right lower extremity strength also stable at 4-4+ ? ?Lab Results: ?Recent Labs  ?  01/21/22 ?4010 01/22/22 ?2725  ?WBC 10.2 9.5  ?HGB 10.1* 9.5*  ?HCT 32.7* 30.4*  ?PLT 123* 120*  ? ?BMET ?Recent Labs  ?  01/20/22 ?0203 01/22/22 ?3664  ?NA 135 134*  ?K 4.2 3.2*  ?CL 98 98  ?CO2 28 26  ?GLUCOSE 101* 112*  ?BUN 28* 34*  ?CREATININE 3.15* 3.23*  ?CALCIUM 8.2* 8.0*  ? ? ?Studies/Results: ?ECHOCARDIOGRAM LIMITED ? ?Result Date: 01/21/2022 ?   ECHOCARDIOGRAM LIMITED REPORT   Patient Name:   Alexander [name] [facility] Date of Exam: 01/20/2022 Medical Rec #:  [id number]     Height:       72.0 in Accession #:    [id number]    Weight:       200.0 lb Date of Birth:  02/11/1961     BSA:          2.130 m? Patient Age:    61 years      BP:           117/65 mmHg Patient Gender: M             HR:           136 bpm. Exam Location:  Inpatient Procedure: Limited Echo Indications:     endocarditis  History:         Patient has prior history of Echocardiogram examinations, most                  recent 10/27/2021. CAD, Pacemaker, end stage renal disease;                  Risk Factors:Hypertension and Diabetes.  Sonographer:     Alexander [name] RDCS Referring Phys:  [place] Diagnosing Phys: Adrian Prows MD IMPRESSIONS  1. Left ventricular ejection fraction, by estimation, is 30 to 35%. The left ventricle has moderately decreased function. The left  ventricle demonstrates global hypokinesis. There is mild left ventricular hypertrophy. Left ventricular diastolic function  could not be evaluated.  2. Right ventricular systolic function is hyperdynamic. The right ventricular size is normal. There is moderately elevated pulmonary artery systolic pressure. The estimated right ventricular systolic pressure is 29.5 mmHg.  3. Mild mitral valve regurgitation.  4. There is a vegetation noted on the TV leaflet and septal leaflet is flail. Cannot exclude presence of vegetation on the pacemaker lead. . The tricuspid valve is degenerative. Tricuspid valve regurgitation is severe.  5. The aortic valve is normal in structure. Aortic valve regurgitation is not visualized.  6. The inferior vena cava is dilated in size with <50% respiratory variability, suggesting right atrial pressure of 15 mmHg. Comparison(s): Compared to 10/26/21, no change in vegetation on the right sided valves. Flail septal leaflet is new. MV vegetation is new. LVEF has further decreased from 40-45% to 30-35%. FINDINGS  Left Ventricle: Left ventricular ejection fraction, by estimation, is 30 to 35%. The left ventricle has moderately decreased function. The left ventricle demonstrates global hypokinesis. The left ventricular internal cavity size was small. There is mild  left ventricular hypertrophy. Abnormal (paradoxical) septal motion, consistent with RV pacemaker. Left ventricular diastolic function could not be evaluated. Right Ventricle: The right ventricular size is normal. Right ventricular systolic function is hyperdynamic. There is moderately elevated pulmonary artery systolic pressure. The tricuspid regurgitant velocity is 2.95 m/s, and with an assumed right atrial pressure of 15 mmHg, the estimated right ventricular systolic pressure is 74.0 mmHg. Left Atrium: Left atrial size was normal in size. Right Atrium: Right atrial size was normal in size. Pericardium: There is no evidence of pericardial  effusion. Mitral Valve: There is mild calcification of the mitral valve leaflet(s). Normal mobility of the mitral valve leaflets. Mild mitral annular calcification. Mild mitral valve regurgitation. Tricuspid Valve: There is a vegetation noted on the TV leaflet and septal leaflet is flail. Cannot exclude presence of vegetation on the pacemaker lead. The tricuspid valve is degenerative in appearance. Tricuspid valve regurgitation is severe. There is holosystolic prolapse of the tricuspid. Aortic Valve: The aortic valve is normal in structure. Aortic valve regurgitation is not visualized. Aorta: The aortic root is normal in size and structure. Venous: The inferior vena cava is dilated in size with less than 50% respiratory variability, suggesting right atrial pressure of 15 mmHg. IAS/Shunts: The interatrial septum was not assessed. Additional Comments: A device lead is visualized. IVC IVC diam: 2.30 cm TRICUSPID VALVE TR Peak grad:   34.8 mmHg TR Vmax:        295.00 cm/s Adrian Prows MD Electronically signed by Adrian Prows MD Signature Date/Time: 01/21/2022/11:08:54 AM    Final    ? ?Assessment/Plan: ?61 year old gentleman with left leg weakness severe spinal stenosis at L2-3.  We were considering decompressive laminectomy plus minus fusion however in the setting of all of his other medical problems extensive work-up by cardiology noting the significant deterioration of his heart likely infection in his heart valve consistent with endocarditis more than likely infection in his back despite the fact that we had negative organism growth I have had extensive conversations with the patient and his wife and I do not feel it is in his best interest to proceed forward with a potential life-threatening operation.  I think we can work with pain management may be some stronger pain medication pain clinic referral postoperatively and supportive care.  But I think that even with surgery we cannot guarantee full return of function and  surgery has a high risk of failure with bleeding infection but also incomplete fusion failure of hardware collapse and significant progressive neurologic deficit from complications of surgery.  All things considered I think its not a Mr. Mccalla best interest to proceed forward ? LOS: 9 days  ? ? ? ?Elaina Hoops ?01/22/2022, 9:46 AM ? ? ? ? ?

## 2022-01-22 NOTE — Progress Notes (Addendum)
Physical Therapy Treatment ?Patient Details ?Name: Alexander [name] ?MRN: [id number] ?DOB: 1961-08-08 ?Today's Date: 01/22/2022 ? ? ?History of Present Illness The pt is a 61 yo male presenting 3/4 with chronic back pain and weakness to LLE in addition to frequent falls (x2 on day of admission). PMH: ESRD on HD TTS, paroxysmal atrial fibrillation on Eliquis, pacemaker, CAD s/p stent, diastolic CHF, PVD with right BKA and left midfoot amputation, history of GI bleed, and DM II. ? ?  ?PT Comments  ? ? Pt is not progressing towards his physical therapy goals; remains limited by pain and self limiting behaviors. Increased time spent on education regarding importance of mobility, performing pressure relief to prevent pressure injuries, and activity progression. Pt performed rolling to R/L with min-mod assist. Adamantly refused sitting on edge of bed, reporting, "my pain has to be under better control first." Then asked for bed pan. PT asked if could return to review BLE exercises for strengthening and pt stating no.  ?  ?Recommendations for follow up therapy are one component of a multi-disciplinary discharge planning process, led by the attending physician.  Recommendations may be updated based on patient status, additional functional criteria and insurance authorization. ? ?Follow Up Recommendations ? Skilled nursing-short term rehab (<3 hours/day) ?  ?  ?Assistance Recommended at Discharge Frequent or constant Supervision/Assistance  ?Patient can return home with the following Two people to help with walking and/or transfers;A lot of help with bathing/dressing/bathroom;Assistance with cooking/housework;Direct supervision/assist for medications management;Assist for transportation;Help with stairs or ramp for entrance ?  ?Equipment Recommendations ? Hospital bed;Other (comment) (hoyer lift)  ?  ?Recommendations for Other Services   ? ? ?  ?Precautions / Restrictions Precautions ?Precautions: Fall ?Restrictions ?Weight  Bearing Restrictions: No  ?  ? ?Mobility ? Bed Mobility ?Overal bed mobility: Needs Assistance ?Bed Mobility: Rolling ?Rolling: Min assist, Mod assist ?  ?  ?  ?  ?General bed mobility comments: Pt requiring modA to roll towards left side, good use of arms pulling on rail, however, has difficulty with shifting hips. MinA to roll towards right side ?  ? ?Transfers ?  ?  ?  ?  ?  ?  ?  ?  ?  ?General transfer comment: deferred ?  ? ?Ambulation/Gait ?  ?  ?  ?  ?  ?  ?  ?  ? ? ?Stairs ?  ?  ?  ?  ?  ? ? ?Wheelchair Mobility ?  ? ?Modified Rankin (Stroke Patients Only) ?  ? ? ?  ?Balance   ?  ?  ?  ?  ?  ?  ?  ?  ?  ?  ?  ?  ?  ?  ?  ?  ?  ?  ?  ? ?  ?Cognition Arousal/Alertness: Awake/alert ?Behavior During Therapy: [facility] for tasks assessed/performed ?Overall Cognitive Status: Within Functional Limits for tasks assessed ?  ?  ?  ?  ?  ?  ?  ?  ?  ?  ?  ?  ?  ?  ?  ?  ?General Comments: self limiting ?  ?  ? ?  ?Exercises   ? ?  ?General Comments   ?  ?  ? ?Pertinent Vitals/Pain Pain Assessment ?Pain Assessment: Faces ?Faces Pain Scale: Hurts worst ?Pain Location: back ?Pain Descriptors / Indicators: Guarding, Grimacing, Spasm ?Pain Intervention(s): Limited activity within patient's tolerance, Monitored during session, Premedicated before session  ? ? ?Home Living   ?  ?  ?  ?  ?  ?  ?  ?  ?  ?   ?  ?  Prior Function    ?  ?  ?   ? ?PT Goals (current goals can now be found in the care plan section) Acute Rehab PT Goals ?Patient Stated Goal: reduce pain ?Potential to Achieve Goals: Fair ?Progress towards PT goals: Not progressing toward goals - comment ? ?  ?Frequency ? ? ? Min 3X/week ? ? ? ?  ?PT Plan Current plan remains appropriate  ? ? ?Co-evaluation   ?  ?  ?  ?  ? ?  ?AM-PAC PT "6 Clicks" Mobility   ?Outcome Measure ? Help needed turning from your back to your side while in a flat bed without using bedrails?: A Lot ?Help needed moving from lying on your back to sitting on the side of a flat bed without using  bedrails?: Total ?Help needed moving to and from a bed to a chair (including a wheelchair)?: Total ?Help needed standing up from a chair using your arms (e.g., wheelchair or bedside chair)?: Total ?Help needed to walk in hospital room?: Total ?Help needed climbing 3-5 steps with a railing? : Total ?6 Click Score: 7 ? ?  ?End of Session   ?Activity Tolerance: Patient limited by pain ?Patient left: in bed;with call bell/phone within reach;with bed alarm set ?Nurse Communication: Mobility status ?PT Visit Diagnosis: Pain;Unsteadiness on feet (R26.81);Repeated falls (R29.6) ?  ? ? ?Time: 6073-7106 ?PT Time Calculation (min) (ACUTE ONLY): 22 min ? ?Charges:  $Therapeutic Activity: 8-22 mins          ?          ? ?[name], PT, DPT ?Acute Rehabilitation Services ?Pager [phone] ?Office [phone] ? ? ? ?Carloine Margo Aye ?01/22/2022, 5:16 PM ? ?

## 2022-01-22 NOTE — Progress Notes (Signed)
? ?Subjective:  ? ? Patient ID: Alexander [name], male    DOB: Sep 28, 1961, 61 y.o.   MRN: [id number] ? ? ?Cc: hx culture negative pacemaker infection, pvd, and lumbar spine imaging djd vs infection ? ?HPI ?Patient known to me ?He had finished 6 weeks iv abx tx and currently has been on oral amox/doxy for chronic suppression ? ?At the time he was treated/evaluated initially for the sepsis/pacer infection, he had ct finding L2,3,5 end plate fx ? ?He has been doing well on oral abx suppression ? ?He came for admission again on 01/13/22 due to worsening back pain without sign of sepsis ? ?Repeat lumbar mri on 3/6 showed l2-3 disc progressive endplate deformities ?normal evolution djd vs partially treated OM/discitis although "there was no significant bone marrow edema or paraspinal fluid collection) ? ?His crp has been elevated/stead though in setting ckd ?He denies fever/chill/n/v/diarrhea ? ?He has chronic bilateral pvd related right bka stump and left tma/foot ulcer ? ? ? ? ?ROS: ?All other ros negative ? ?   ?Objective:  ? Physical exam ?Vitals:  ? 01/21/22 2237 01/22/22 0512  ?BP: 120/78 119/70  ?Pulse: (!) 101 99  ?Resp: 18 18  ?Temp: 98.2 ?F (36.8 ?C) 97.8 ?F (36.6 ?C)  ?SpO2: 94% 95%  ? ?General/constitutional: no distress, pleasant ?HEENT: poor dentition ?Neck supple ?CV: rrr no mrg ?Lungs: clear to auscultation, normal respiratory effort ?Abd: Soft, Nontender ?Ext: no edema ?Skin: No Rash ?Neuro: nonfocal ?MSK: bka right and left tma stump with dressing in place that is clean/dry ? ? ? ?Imaging: ?3/6 mri l spine ?1. As seen on recent CT, there are progressive endplate deformities ?adjacent to the L2-3 disc with associated nonspecific discal ?hyperintensity. Based on previous studies, findings may reflect ?progressive subacute fractures with progressive endplate ?degeneration. Partially treated discitis/osteomyelitis is a ?consideration, although there is no significant bone marrow edema or ?paraspinal fluid  collection. ?2. Associated disc degeneration with annular disc bulging, facet and ?ligamentous hypertrophy contributing to severe multifactorial spinal ?stenosis and moderate narrowing of the lateral recesses and foramina ?bilaterally. ?3. Previous posterior decompression at L3-4 and L4-5 without ?significant recurrent spinal stenosis. ?4. Chronic moderate foraminal narrowing bilaterally at L5-S1 due to ?disc bulging and endplate osteophytes. ? ? ?   ?Assessment & Plan:  ? ?Lines: ?pacemaker ?  ?Abx: ?11/22/21-c amox 500 qhs and doxy 100 mg po bid ? ?12/16-1/11/23 cefepime ?12/16-1/11/23 vanc ? ?12/14-16 ceftriaxone ?12/14-16 doxycycline ?  ?ASSESSMENT: ?61 yo male with CAD, s/p pacemaker, esrd on iHD via rue avf, afib, admitted initially for presumed pna, found to have TEE evidence of TV/pacemaker vegetations and subacute lower back pain with ct finding L2,3,5 end plate fx, s/p 6 weeks iv empiric abx and transitioned to oral doxy/amox suppression, admitted for increasing lower back pain with imaging suggestion of djd vs partially treated l2-3 discitis/om ?  ?He underwent ir biopsy l2-3 and cx negative. Suspect it will remain negative as if he had infection previously should have been treated ? ?Surgery had decided against intervention -- I agree as he is high risk for bsi and seeding of any foreign body given his chronic ulcers in LE/poor dention/esrd status ? ? ?-continue oral suppressive doxycycline and amoxicillin as above indefinitely ?-follow up in id clinic with me on 02/14/22 @ 245pm ?-id will sign off ?-discussed with primary team ? ?I spent more than 35 minute reviewing data/chart, and coordinating care and >50% direct face to face time providing counseling/discussing diagnostics/treatment plan with patient ? ? ?

## 2022-01-22 NOTE — TOC Progression Note (Signed)
Transition of Care (TOC) - Initial/Assessment Note  ? ? ?Patient Details  ?Name: Alexander [name] ?MRN: [id number] ?Date of Birth: 01-28-1961 ? ?Transition of Care (TOC) CM/SW Contact:    ?Paulene Floor Derrico Zhong, LCSWA ?Phone Number: ?01/22/2022, 1:40 PM ? ?Clinical Narrative:                 ?TOC continuing to follow patient for any d/c planning needs once medically stable. ? ?Patient has declined both SNF and home health. ? ?Lind Covert, MSW, LCSWA  ? ?Expected Discharge Plan: Home/Self Care ?Barriers to Discharge: Continued Medical Work up ? ? ?Patient Goals and CMS Choice ?Patient states their goals for this hospitalization and ongoing recovery are:: Pt states he would like to return home after hospitalization. ?CMS Medicare.gov Compare Post Acute Care list provided to:: Patient ?Choice offered to / list presented to : Patient ? ?Expected Discharge Plan and Services ?Expected Discharge Plan: Home/Self Care ?  ?  ?Post Acute Care Choice: Resumption of Svcs/PTA Provider ?Living arrangements for the past 2 months: [place] ?                ?  ?  ?  ?  ?  ?  ?  ?  ?  ?  ? ?Prior Living Arrangements/Services ?Living arrangements for the past 2 months: [place] ?Lives with:: Adult Children, Spouse ?Patient language and need for interpreter reviewed:: Yes ?Do you feel safe going back to the place where you live?: Yes      ?Need for Family Participation in Patient Care: Yes (Comment) ?Care giver support system in place?: Yes (comment) ?Current home services: DME ?Criminal Activity/Legal Involvement Pertinent to Current Situation/Hospitalization: No - Comment as needed ? ?Activities of Daily Living ?Home Assistive Devices/Equipment: CBG Meter ?ADL Screening (condition at time of admission) ?Patient's cognitive ability adequate to safely complete daily activities?: Yes ?Is the patient deaf or have difficulty hearing?: No ?Does the patient have difficulty seeing, even when wearing glasses/contacts?: No ?Does the  patient have difficulty concentrating, remembering, or making decisions?: No ?Patient able to express need for assistance with ADLs?: Yes ?Does the patient have difficulty dressing or bathing?: Yes ?Independently performs ADLs?: No ?Communication: Independent ?Dressing (OT): Needs assistance ?Is this a change from baseline?: Pre-admission baseline ?Grooming: Independent ?Feeding: Independent ?Bathing: Needs assistance ?Is this a change from baseline?: Change from baseline, expected to last >3 days ?Toileting: Needs assistance ?Is this a change from baseline?: Pre-admission baseline ?In/Out Bed: Needs assistance ?Is this a change from baseline?: Pre-admission baseline ?Does the patient have difficulty walking or climbing stairs?: Yes ?Weakness of Legs: Both ?Weakness of Arms/Hands: Both ? ?Permission Sought/Granted ?Permission sought to share information with : Family Supports ?Permission granted to share information with : No ?   ?   ?   ?   ? ?Emotional Assessment ?Appearance:: Appears stated age ?Attitude/Demeanor/Rapport: Engaged ?Affect (typically observed): Appropriate ?Orientation: : Oriented to Self, Oriented to Place, Oriented to  Time, Oriented to Situation ?Alcohol / Substance Use: Not Applicable ?Psych Involvement: No (comment) ? ?Admission diagnosis:  Intractable back pain [M54.9] ?Patient Active Problem List  ? Diagnosis Date Noted  ? Endocarditis 01/21/2022  ? Ulcer of right leg, limited to breakdown of skin ([place])   ? Below-knee amputation of right lower extremity ([place])   ? Ulcer of left foot, limited to breakdown of skin ([place])   ? History of transmetatarsal amputation of left foot ([place])   ? Lumbar back pain with radiculopathy affecting  left lower extremity 01/14/2022  ? Dyslipidemia 01/14/2022  ? Type 2 diabetes mellitus with chronic kidney disease, with long-term current use of insulin ([place]) 01/14/2022  ? GERD without esophagitis 01/14/2022  ? Pressure injury of skin 01/14/2022  ? Intractable back  pain 01/13/2022  ? Blister 11/08/2021  ? Malnutrition of moderate degree 11/02/2021  ? Pacemaker infection ([place])   ? Transaminitis   ? Tricuspid valve vegetation   ? Thrombocytopenia ([place]) 10/26/2021  ? Hyponatremia 10/26/2021  ? Acute on chronic diastolic CHF (congestive heart failure) ([place]) 10/26/2021  ? Atrial fibrillation with RVR ([place]) 10/25/2021  ? Symptomatic anemia 09/30/2021  ? ESRD (end stage renal disease) ([place]) 09/30/2021  ? PAF (paroxysmal atrial fibrillation) ([place]) 09/30/2021  ? NSVT (nonsustained ventricular tachycardia)   ? Pancytopenia ([place])   ? Acute blood loss anemia 07/18/2020  ? Hemodialysis patient ([place]) 06/04/2020  ? Necrotizing glomerulonephritis 06/04/2020  ? Heme positive stool   ? Melena   ? Upper GI bleeding 05/23/2020  ? Acute GI bleeding 05/23/2020  ? Acute hematogenous osteomyelitis of right foot ([place]) 03/11/2020  ? Gangrene of right foot ([place])   ? Diabetic foot ulcer associated with type 2 diabetes mellitus ([place]) 02/19/2020  ? Heel ulcer ([place]) 11/05/2019  ? Atherosclerotic PVD with ulceration ([place]) 09/09/2017  ? Essential hypertension, benign 02/07/2015  ? Renal insufficiency 02/07/2015  ? Post PTCA 11/09/2014  ? S/P PTCA (percutaneous transluminal coronary angioplasty) 11/09/2014  ? Pacemaker: [place] DR MRI [id number] Dual chamber pacemaker 10/08/2014 10/08/2014  ? Encounter for care of pacemaker 10/08/2014  ? Sinus node dysfunction ([place]) 10/08/2014  ? Cardiac asystole ([place]) 10/07/2014  ? Cardiac syncope 10/07/2014  ? Diabetes mellitus with stage 3 chronic kidney disease ([place]) 10/07/2014  ? CAD (coronary artery disease), native coronary artery 10/07/2014  ? Diabetes mellitus due to underlying condition without complications ([place]) 73/41/9379  ? Anemia, unspecified 05/25/2014  ? Syncope 04/30/2014  ? Atherosclerosis of native arteries of the extremities with gangrene ([place]) 12/16/2013  ? Volume overload 10/30/2013  ? Acute renal failure ([place]) 10/28/2013  ? PVD (peripheral vascular  disease) ([place]) 10/27/2013  ? Leukocytosis 10/21/2013  ? Anemia 10/21/2013  ? Osteomyelitis of left foot ([place]) 08/19/2013  ? Spinal stenosis, lumbar region, with neurogenic claudication 06/12/2012  ?  Class: Diagnosis of  ? Diabetes mellitus with end stage renal disease ([place]) 03/15/2009  ? ALCOHOL ABUSE 03/15/2009  ? TOBACCO USE 03/15/2009  ? HYPERTENSION 03/15/2009  ? ?PCP:  [name], MD ?Pharmacy:   ?[facility], [address] ?[place] [address] ?Phone: [phone] Fax: [phone] ? ?[id number] [place], [place] - [address]?Phone: [phone]: [phone]? ?[facility] ?[address]?[place]?Phone: [phone]: [phone] ? ? ? ? ?Social Determinants of Health (SDOH) Interventions ?  ? ?Readmission Risk Interventions ?Readmission Risk Prevention Plan 10/27/2021 05/26/2020 03/15/2020  ?Transportation Screening Complete Complete Complete  ?PCP or Specialist Appt within 5-7 Days - - Complete  ?PCP or Specialist Appt within 3-5 Days Complete - -  ?Home Care Screening - - Complete  ?Medication Review (RN CM) - - Complete  ?[place] Consult Complete - -  ?Social Work Consult for [place]/Counseling Complete - -  ?Palliative Care Screening Not Applicable - -  ?Medication Review (Press photographer Complete Complete -  ?PCP or Specialist appointment within  3-5 days of discharge - Complete -  ?HRI or Home Care Consult - Complete -  ?SW Recovery Care/Counseling Consult - Complete -  ?Palliative Care Screening - Not Applicable -  ?Skilled Nursing Facility - Not Applicable -  ?Some recent data might be hidden  ? ? ? ?

## 2022-01-22 NOTE — Progress Notes (Signed)
PROGRESS NOTE    Alexander [name]  FGH:[id number] DOB: [birth year] DOA: 01/13/2022 PCP: [name], MD    Brief Narrative:  Alexander [name] is a 61 y.o. male with medical history significant for ESRD on HD on TTS, HTN, HLD, PVD status post right BKA and left midfoot amputation, Afib on Eliquis, CAD, DM2, and status post pacemaker, presented to the ER with worsening intractable low back pain over the last couple of days with associated left lower extremity radiation and radiculopathy with associated spasm and weakness, denies any urinary or stool incontinence. He has been having chronic low back pain with occasional radiation to both lower extremities. Pt has been followed by spine surgery and was scheduled for LS spine MRI on 02/07/2022.  Plan is for disc aspiration and if no infection then patient will have surgical decompression.  Aspiration done 3/10.  Surgery was planned for Monday but decision was made on Monday AM not to pursue further due to risk and chance surgery may not fix.   Appreciate Dr. Irven Shelling help with [place].      Assessment and Plan: * Lumbar back pain with radiculopathy affecting left lower extremity CRP, ESR elevated CT noted moderate neuroforaminal stenosis at L2-L3 and L5-S1 MRI lumbar spine showed subacute fractures L2-L3 disc, partially treated discitis/osteomyelitis is a consideration, severe multifactorial spinal stenosis at L2-3 Followed by spine surgery Dr Saintclair Halsted- surgery cancelled as high risk IR consulted for disc aspiration to rule out any infection done 3/10- culture negative Completed IV decadron Pain management as able- plan to change to PO dilaudid and monitor-- adjust as able PT/OT- recommending SNF- so far patient is refusing  ESRD (end stage renal disease) (Alexander [name]) HD- T/T/S Nephrology consulted  PAF (paroxysmal atrial fibrillation) (Alexander [name]) Continue Lopressor and resume eliquis  Essential hypertension, benign Continue Lopressor- holding  parameters  Dyslipidemia - We will continue statin therapy.  Type 2 diabetes mellitus with chronic kidney disease, with long-term current use of insulin (HCC) Last A1c 6.8 SSI, NovoLog 70/30 twice daily at a lower dose, hypoglycemic protocol, Accu-Cheks  GERD without esophagitis Continue PPI therapy.  Endocarditis - LVEF is further slightly reduced compared to previous.  Limited echocardiogram hence may be inaccurate.  There appears to be flail tricuspid valve leaflet.  Severe tricuspid regurgitation.  PA pressures has increased compared to previous.  There also appears to be a vegetation on the anterior mitral leaflet.   Below-knee amputation of right lower extremity (Alexander [name]) At outpatient visit on 01/10/2022 with orthopedics, noted BLE with significant weeping, edema with induration, both noted with distal maceration with L LE noted open ulceration Concern for ischemic changes, placed in Unna and Dynaflex compression wraps bilaterally Consulted [name], we will continue with serial compression wraps and outpatient follow-up  Pressure injury of skin Pressure Injury 01/14/22 Ischial tuberosity Right Stage 3 -  Full thickness tissue loss. Subcutaneous fat may be visible but bone, tendon or muscle are NOT exposed. (Active)  01/14/22 0321  Location: Ischial tuberosity  Location Orientation: Right  Staging: Stage 3 -  Full thickness tissue loss. Subcutaneous fat may be visible but bone, tendon or muscle are NOT exposed.  Wound Description (Comments):   Present on Admission: Yes     Pressure Injury 01/14/22 Buttocks Right Stage 2 -  Partial thickness loss of dermis presenting as a shallow open injury with a red, pink wound bed without slough. (Active)  01/14/22 0322  Location: Buttocks  Location Orientation: Right  Staging: Stage 2 -  Partial  thickness loss of dermis presenting as a shallow open injury with a red, pink wound bed without slough.  Wound Description (Comments):   Present on  Admission: Yes     Pressure Injury 01/14/22 Sacrum Mid Stage 2 -  Partial thickness loss of dermis presenting as a shallow open injury with a red, pink wound bed without slough. (Active)  01/14/22 0322  Location: Sacrum  Location Orientation: Mid  Staging: Stage 2 -  Partial thickness loss of dermis presenting as a shallow open injury with a red, pink wound bed without slough.  Wound Description (Comments):   Present on Admission: Yes     Pressure Injury 01/14/22 Buttocks Left Stage 2 -  Partial thickness loss of dermis presenting as a shallow open injury with a red, pink wound bed without slough. (Active)  01/14/22 0323  Location: Buttocks  Location Orientation: Left  Staging: Stage 2 -  Partial thickness loss of dermis presenting as a shallow open injury with a red, pink wound bed without slough.  Wound Description (Comments):   Present on Admission: Yes     Pressure Injury 01/14/22 Buttocks Posterior;Proximal;Right Stage 2 -  Partial thickness loss of dermis presenting as a shallow open injury with a red, pink wound bed without slough. (Active)  01/14/22 0324  Location: Buttocks  Location Orientation: Posterior;Proximal;Right  Staging: Stage 2 -  Partial thickness loss of dermis presenting as a shallow open injury with a red, pink wound bed without slough.  Wound Description (Comments):   Present on Admission: Yes       Pacemaker infection ([place]) Follows with ID outpatient -appreciated ID consult: Surgery had decided against intervention -- I agree as he is high risk for bsi and seeding of any foreign body given his chronic ulcers in LE/poor dention/esrd status     -continue oral suppressive doxycycline and amoxicillin as above indefinitely -follow up in id clinic with me on 02/14/22 @ 245pm -id will sign off          DVT prophylaxis:  apixaban (ELIQUIS) tablet 5 mg    Code Status: Full Code Family Communication:  multiple at bedside  Disposition Plan:  Level of  care: Med-Surg Status is: Inpatient Remains inpatient appropriate because: pain control    Consultants:  NS Palliative care nephrology   Subjective: Agreeable to change to PO pain management   Objective: Vitals:   01/21/22 0938 01/21/22 1700 01/21/22 2237 01/22/22 0512  BP: 109/66 112/60 120/78 119/70  Pulse: 88 80 (!) 101 99  Resp: _0 Temp: 98 F (36.7 C) 97.8 F (36.6 C) 98.2 F (36.8 C) 97.8 F (36.6 C)  TempSrc: Oral Oral Oral Oral  SpO2: 95% 96% 94% 95%  Weight:      Height:        Intake/Output Summary (Last 24 hours) at 01/22/2022 1450 Last data filed at 01/22/2022 0820 Gross per 24 hour  Intake 180 ml  Output 400 ml  Net -220 ml   Filed Weights   01/20/22 1241 01/20/22 1525 01/21/22 0508  Weight: 90.7 kg 89.5 kg 90.2 kg    Examination:   General: Appearance:     Overweight male in no acute distress     Lungs:     respirations unlabored  Heart:    Normal heart rate.    MS:   Below knee amputation of right lower extremity is noted. Amputation of left foot is noted. Left  amputation noted. Right  amputation noted.  Neurologic:   Awake, alert, oriented x 3. No apparent focal neurological           defect.        Data Reviewed: I have personally reviewed following labs and imaging studies  CBC: Recent Labs  Lab 01/18/22 0337 01/19/22 1651 01/20/22 0203 01/21/22 0053 01/22/22 0417  WBC 9.5 10.3 9.9 10.2 9.5  NEUTROABS 6.2 8.1* 7.1 7.8* 6.1  HGB 9.9* 10.3* 9.7* 10.1* 9.5*  HCT 31.8* 34.3* 31.5* 32.7* 30.4*  MCV 100.3* 100.0 99.7 99.4 98.4  PLT 121* 131* 115* 123* 401*   Basic Metabolic Panel: Recent Labs  Lab 01/16/22 0333 01/17/22 0232 01/18/22 0337 01/19/22 1227 01/20/22 0203 01/22/22 0417  NA 130* 133* 133* 134* 135 134*  K 4.3 3.9 5.1 4.3 4.2 3.2*  CL 95* 98 97* 95* 98 98  CO2 _0 GLUCOSE 181* 241* 202* 168* 101* 112*  BUN 34* 27* 39* 27* 28* 34*  CREATININE 3.99* 2.92* 3.56* 2.82* 3.15* 3.23*   CALCIUM 8.5* 8.3* 8.5* 8.2* 8.2* 8.0*  PHOS 4.9*  --  4.9*  --  4.1  --    GFR: Estimated Creatinine Clearance: 25.7 mL/min (A) (by C-G formula based on SCr of 3.23 mg/dL (H)). Liver Function Tests: Recent Labs  Lab 01/20/22 0203  ALBUMIN 1.9*   No results for input(s): LIPASE, AMYLASE in the last 168 hours. No results for input(s): AMMONIA in the last 168 hours. Coagulation Profile: Recent Labs  Lab 01/17/22 0232 01/18/22 0337  INR 1.9* 1.4*   Cardiac Enzymes: No results for input(s): CKTOTAL, CKMB, CKMBINDEX, TROPONINI in the last 168 hours. BNP (last 3 results) No results for input(s): PROBNP in the last 8760 hours. HbA1C: No results for input(s): HGBA1C in the last 72 hours. CBG: Recent Labs  Lab 01/21/22 1123 01/21/22 1618 01/21/22 2133 01/22/22 0736 01/22/22 1155  GLUCAP 136* 163* 88 124* 110*   Lipid Profile: No results for input(s): CHOL, HDL, LDLCALC, TRIG, CHOLHDL, LDLDIRECT in the last 72 hours. Thyroid Function Tests: No results for input(s): TSH, T4TOTAL, FREET4, T3FREE, THYROIDAB in the last 72 hours. Anemia Panel: No results for input(s): VITAMINB12, FOLATE, FERRITIN, TIBC, IRON, RETICCTPCT in the last 72 hours. Sepsis Labs: No results for input(s): PROCALCITON, LATICACIDVEN in the last 168 hours.  Recent Results (from the past 240 hour(s))  Resp Panel by RT-PCR (Flu A&B, Covid) Nasopharyngeal Swab     Status: None   Collection Time: 01/14/22  1:13 AM   Specimen: Nasopharyngeal Swab; Nasopharyngeal(NP) swabs in vial transport medium  Result Value Ref Range Status   SARS Coronavirus 2 by RT PCR NEGATIVE NEGATIVE Final    Comment: (NOTE) SARS-CoV-2 target nucleic acids are NOT DETECTED.  The SARS-CoV-2 RNA is generally detectable in upper respiratory specimens during the acute phase of infection. The lowest concentration of SARS-CoV-2 viral copies this assay can detect is 138 copies/mL. A negative result does not preclude SARS-Cov-2 infection  and should not be used as the sole basis for treatment or other patient management decisions. A negative result may occur with  improper specimen collection/handling, submission of specimen other than nasopharyngeal swab, presence of viral mutation(s) within the areas targeted by this assay, and inadequate number of viral copies(<138 copies/mL). A negative result must be combined with clinical observations, patient history, and epidemiological information. The expected result is Negative.  Fact Sheet for Patients:  EntrepreneurPulse.com.au  Fact Sheet for Healthcare Providers:  IncredibleEmployment.be  This test is no t  yet approved or cleared by the [place] and  has been authorized for detection and/or diagnosis of SARS-CoV-2 by FDA under an Emergency Use Authorization (EUA). This EUA will remain  in effect (meaning this test can be used) for the duration of the COVID-19 declaration under Section 564(b)(1) of the Act, 21 U.S.C.section 360bbb-3(b)(1), unless the authorization is terminated  or revoked sooner.       Influenza A by PCR NEGATIVE NEGATIVE Final   Influenza B by PCR NEGATIVE NEGATIVE Final    Comment: (NOTE) The Xpert Xpress SARS-CoV-2/FLU/RSV plus assay is intended as an aid in the diagnosis of influenza from Nasopharyngeal swab specimens and should not be used as a sole basis for treatment. Nasal washings and aspirates are unacceptable for Xpert Xpress SARS-CoV-2/FLU/RSV testing.  Fact Sheet for Patients: EntrepreneurPulse.com.au  Fact Sheet for Healthcare Providers: IncredibleEmployment.be  This test is not yet approved or cleared by the [place] FDA and has been authorized for detection and/or diagnosis of SARS-CoV-2 by FDA under an Emergency Use Authorization (EUA). This EUA will remain in effect (meaning this test can be used) for the duration of the COVID-19 declaration  under Section 564(b)(1) of the Act, 21 U.S.C. section 360bbb-3(b)(1), unless the authorization is terminated or revoked.  Performed at [facility], [place] [address] [place], [place] [address]   MRSA Next Gen by PCR, Nasal     Status: None   Collection Time: 01/14/22  7:18 AM   Specimen: Nasal Mucosa; Nasal Swab  Result Value Ref Range Status   MRSA by PCR Next Gen NOT DETECTED NOT DETECTED Final    Comment: (NOTE) The GeneXpert MRSA Assay (FDA approved for NASAL specimens only), is one component of a comprehensive MRSA colonization surveillance program. It is not intended to diagnose MRSA infection nor to guide or monitor treatment for MRSA infections. Test performance is not FDA approved in patients less than 36 years old. Performed at [facility], [place] [address] [place], [place] [address]   Culture, blood (routine x 2)     Status: None   Collection Time: 01/14/22  3:24 PM   Specimen: BLOOD LEFT WRIST  Result Value Ref Range Status   Specimen Description BLOOD LEFT WRIST  Final   Special Requests   Final    BOTTLES DRAWN AEROBIC AND ANAEROBIC Blood Culture results may not be optimal due to an inadequate volume of blood received in culture bottles   Culture   Final    NO GROWTH 5 DAYS Performed at [facility], [place] [address] [place], [place] [address]    Report Status 01/19/2022 FINAL  Final  Culture, blood (routine x 2)     Status: None   Collection Time: 01/14/22  3:33 PM   Specimen: BLOOD LEFT FOREARM  Result Value Ref Range Status   Specimen Description BLOOD LEFT FOREARM  Final   Special Requests   Final    BOTTLES DRAWN AEROBIC AND ANAEROBIC Blood Culture adequate volume   Culture   Final    NO GROWTH 5 DAYS Performed at [facility], [place] [address] [place], [place] [address]    Report Status 01/19/2022 FINAL  Final  Surgical pcr screen     Status: None   Collection Time: 01/19/22 12:05 PM   Specimen: Nasal Mucosa; Nasal Swab  Result  Value Ref Range Status   MRSA, PCR NEGATIVE NEGATIVE Final   Staphylococcus aureus NEGATIVE NEGATIVE Final    Comment: (  NOTE) The Xpert SA Assay (FDA approved for NASAL specimens in patients 64 years of age and older), is one component of a comprehensive surveillance program. It is not intended to diagnose infection nor to guide or monitor treatment. Performed at [facility], [place] [address] [place], [place] [address]   Aerobic/Anaerobic Culture w Gram Stain (surgical/deep wound)     Status: None (Preliminary result)   Collection Time: 01/19/22  2:52 PM   Specimen: PATH Cytology FNA; Tissue  Result Value Ref Range Status   Specimen Description WOUND  Final   Special Requests L3/L4 DISC ASPIRATION  Final   Gram Stain   Final    RARE WBC PRESENT,BOTH PMN AND MONONUCLEAR NO ORGANISMS SEEN    Culture   Final    NO GROWTH 3 DAYS NO ANAEROBES ISOLATED; CULTURE IN PROGRESS FOR 5 DAYS Performed at [facility], [address] [place], [place] [address]    Report Status PENDING  Incomplete         Radiology Studies: ECHOCARDIOGRAM LIMITED  Result Date: 01/21/2022    ECHOCARDIOGRAM LIMITED REPORT   Patient Name:   DETRELL UMSCHEID [facility] Date of Exam: 01/20/2022 Medical Rec #:  [id number]     Height:       72.0 in Accession #:    [id number]    Weight:       200.0 lb Date of Birth:  1961/07/19     BSA:          2.130 m Patient Age:    70 years      BP:           117/65 mmHg Patient Gender: M             HR:           136 bpm. Exam Location:  Inpatient Procedure: Limited Echo Indications:     endocarditis  History:         Patient has prior history of Echocardiogram examinations, most                  recent 10/27/2021. CAD, Pacemaker, end stage renal disease;                  Risk Factors:Hypertension and Diabetes.  Sonographer:     Johny Chess RDCS Referring Phys:  [place] Diagnosing Phys: Adrian Prows MD IMPRESSIONS  1. Left ventricular ejection fraction, by estimation, is  30 to 35%. The left ventricle has moderately decreased function. The left ventricle demonstrates global hypokinesis. There is mild left ventricular hypertrophy. Left ventricular diastolic function  could not be evaluated.  2. Right ventricular systolic function is hyperdynamic. The right ventricular size is normal. There is moderately elevated pulmonary artery systolic pressure. The estimated right ventricular systolic pressure is 15.1 mmHg.  3. Mild mitral valve regurgitation.  4. There is a vegetation noted on the TV leaflet and septal leaflet is flail. Cannot exclude presence of vegetation on the pacemaker lead. . The tricuspid valve is degenerative. Tricuspid valve regurgitation is severe.  5. The aortic valve is normal in structure. Aortic valve regurgitation is not visualized.  6. The inferior vena cava is dilated in size with <50% respiratory variability, suggesting right atrial pressure of 15 mmHg. Comparison(s): Compared to 10/26/21, no change in vegetation on the right sided valves. Flail septal leaflet is new. MV vegetation is new. LVEF has further decreased from 40-45% to 30-35%. FINDINGS  Left Ventricle: Left ventricular ejection  fraction, by estimation, is 30 to 35%. The left ventricle has moderately decreased function. The left ventricle demonstrates global hypokinesis. The left ventricular internal cavity size was small. There is mild  left ventricular hypertrophy. Abnormal (paradoxical) septal motion, consistent with RV pacemaker. Left ventricular diastolic function could not be evaluated. Right Ventricle: The right ventricular size is normal. Right ventricular systolic function is hyperdynamic. There is moderately elevated pulmonary artery systolic pressure. The tricuspid regurgitant velocity is 2.95 m/s, and with an assumed right atrial pressure of 15 mmHg, the estimated right ventricular systolic pressure is 78.6 mmHg. Left Atrium: Left atrial size was normal in size. Right Atrium: Right atrial  size was normal in size. Pericardium: There is no evidence of pericardial effusion. Mitral Valve: There is mild calcification of the mitral valve leaflet(s). Normal mobility of the mitral valve leaflets. Mild mitral annular calcification. Mild mitral valve regurgitation. Tricuspid Valve: There is a vegetation noted on the TV leaflet and septal leaflet is flail. Cannot exclude presence of vegetation on the pacemaker lead. The tricuspid valve is degenerative in appearance. Tricuspid valve regurgitation is severe. There is holosystolic prolapse of the tricuspid. Aortic Valve: The aortic valve is normal in structure. Aortic valve regurgitation is not visualized. Aorta: The aortic root is normal in size and structure. Venous: The inferior vena cava is dilated in size with less than 50% respiratory variability, suggesting right atrial pressure of 15 mmHg. IAS/Shunts: The interatrial septum was not assessed. Additional Comments: A device lead is visualized. IVC IVC diam: 2.30 cm TRICUSPID VALVE TR Peak grad:   34.8 mmHg TR Vmax:        295.00 cm/s Adrian Prows MD Electronically signed by Adrian Prows MD Signature Date/Time: 01/21/2022/11:08:54 AM    Final         Scheduled Meds:  (feeding supplement) PROSource Plus  30 mL Oral BID BM   amoxicillin  500 mg Oral Q24H   apixaban  5 mg Oral BID   atorvastatin  20 mg Oral QHS   calcitRIOL  0.25 mcg Oral Daily   Chlorhexidine Gluconate Cloth  6 each Topical Daily   Chlorhexidine Gluconate Cloth  6 each Topical Once   [START ON 01/25/2022] darbepoetin (ARANESP) injection - DIALYSIS  60 mcg Intravenous Q Thu-HD   docusate sodium  100 mg Oral BID   doxycycline  100 mg Oral Q12H   furosemide  80 mg Oral Once per day on Sun Mon Wed Fri   insulin aspart  0-6 Units Subcutaneous TID WC   insulin aspart protamine- aspart  8 Units Subcutaneous BID WC   lidocaine  1 patch Transdermal Q24H   metoprolol tartrate  25 mg Oral BID   multivitamin with minerals  1 tablet Oral Once  per day on Mon Thu   pantoprazole  40 mg Oral Daily   Continuous Infusions:   LOS: 9 days    Time spent: 75 minutes spent on chart review, discussion with nursing staff, consultants, updating family and interview/physical exam; more than 50% of that time was spent in counseling and/or coordination of care.    Geradine Girt, DO [facility] Available via Epic secure chat 7am-7pm After these hours, please refer to coverage provider listed on [email or web address] 01/22/2022, 2:50 PM

## 2022-01-22 NOTE — Progress Notes (Signed)
PHARMACY NOTE:  ANTIMICROBIAL RENAL DOSAGE ADJUSTMENT ? ?Current antimicrobial regimen includes a mismatch between antimicrobial dosage and estimated renal function.  As per policy approved by the Pharmacy & Therapeutics and Medical Executive Committees, the antimicrobial dosage will be adjusted accordingly. ? ?Current antimicrobial dosage:  amoxicillin 500 mg PO Q12H  ? ?Indication: Infective Endocarditis Suppression  ? ?Renal Function: ? ?Estimated Creatinine Clearance: 25.7 mL/min (A) (by C-G formula based on SCr of 3.23 mg/dL (H)). ?'[]'$      On intermittent HD, scheduled: ?'[]'$      On CRRT ?   ?Antimicrobial dosage has been changed to:  Amoxicillin 500 mg every 24 hours>>This is patient's chronic suppression dose at home ? ?Jimmy Footman, PharmD, BCPS, BCIDP ?Infectious Diseases Clinical Pharmacist ?Phone: [phone] ?01/22/2022 10:57 AM  ? ?

## 2022-01-22 NOTE — Plan of Care (Signed)
  Problem: Clinical Measurements: Goal: Will remain free from infection Outcome: Progressing   Problem: Activity: Goal: Risk for activity intolerance will decrease Outcome: Progressing   Problem: Coping: Goal: Level of anxiety will decrease Outcome: Progressing   

## 2022-01-23 DIAGNOSIS — M5416 Radiculopathy, lumbar region: Secondary | ICD-10-CM | POA: Diagnosis not present

## 2022-01-23 LAB — CBC WITH DIFFERENTIAL/PLATELET
Abs Immature Granulocytes: 0.05 10*3/uL (ref 0.00–0.07)
Basophils Absolute: 0 10*3/uL (ref 0.0–0.1)
Basophils Relative: 0 %
Eosinophils Absolute: 0 10*3/uL (ref 0.0–0.5)
Eosinophils Relative: 0 %
HCT: 33.1 % — ABNORMAL LOW (ref 39.0–52.0)
Hemoglobin: 10.2 g/dL — ABNORMAL LOW (ref 13.0–17.0)
Immature Granulocytes: 1 %
Lymphocytes Relative: 18 %
Lymphs Abs: 2 10*3/uL (ref 0.7–4.0)
MCH: 29.8 pg (ref 26.0–34.0)
MCHC: 30.8 g/dL (ref 30.0–36.0)
MCV: 96.8 fL (ref 80.0–100.0)
Monocytes Absolute: 0.8 10*3/uL (ref 0.1–1.0)
Monocytes Relative: 7 %
Neutro Abs: 7.9 10*3/uL — ABNORMAL HIGH (ref 1.7–7.7)
Neutrophils Relative %: 74 %
Platelets: 128 10*3/uL — ABNORMAL LOW (ref 150–400)
RBC: 3.42 MIL/uL — ABNORMAL LOW (ref 4.22–5.81)
RDW: 17.5 % — ABNORMAL HIGH (ref 11.5–15.5)
WBC: 10.7 10*3/uL — ABNORMAL HIGH (ref 4.0–10.5)
nRBC: 0 % (ref 0.0–0.2)

## 2022-01-23 LAB — GLUCOSE, CAPILLARY
Glucose-Capillary: 137 mg/dL — ABNORMAL HIGH (ref 70–99)
Glucose-Capillary: 157 mg/dL — ABNORMAL HIGH (ref 70–99)
Glucose-Capillary: 73 mg/dL (ref 70–99)

## 2022-01-23 MED ORDER — ALBUMIN HUMAN 25 % IV SOLN: 25.0000 g | Freq: Once | INTRAVENOUS | Status: AC

## 2022-01-23 MED ORDER — ALBUMIN HUMAN 25 % IV SOLN
INTRAVENOUS | Status: AC
  Administered 2022-01-23: 25 g via INTRAVENOUS
  Filled 2022-01-23: qty 100

## 2022-01-23 MED ORDER — OXYCODONE-ACETAMINOPHEN 5-325 MG PO TABS
1.0000 | ORAL_TABLET | ORAL | Status: DC | PRN
  Administered 2022-01-23: 1 via ORAL
  Administered 2022-01-23 – 2022-01-24 (×5): 2 via ORAL
  Filled 2022-01-23 (×6): qty 2

## 2022-01-23 NOTE — Plan of Care (Signed)
?  Problem: Health Behavior/Discharge Planning: ?Goal: Ability to manage health-related needs will improve ?Outcome: Progressing ?  ?Problem: Clinical Measurements: ?Goal: Ability to maintain clinical measurements within normal limits will improve ?Outcome: Progressing ?Goal: Will remain free from infection ?Outcome: Progressing ?Goal: Diagnostic test results will improve ?Outcome: Progressing ?Goal: Respiratory complications will improve ?Outcome: Progressing ?Goal: Cardiovascular complication will be avoided ?Outcome: Progressing ?  ?Problem: Activity: ?Goal: Risk for activity intolerance will decrease ?Outcome: Progressing ?  ?Problem: Nutrition: ?Goal: Adequate nutrition will be maintained ?Outcome: Progressing ?  ?Problem: Coping: ?Goal: Level of anxiety will decrease ?Outcome: Progressing ?  ?Problem: Elimination: ?Goal: Will not experience complications related to bowel motility ?Outcome: Progressing ?Goal: Will not experience complications related to urinary retention ?Outcome: Progressing ?  ?Problem: Pain Managment: ?Goal: General experience of comfort will improve ?Outcome: Not Progressing ?  ?Problem: Safety: ?Goal: Ability to remain free from injury will improve ?Outcome: Progressing ?  ?Problem: Skin Integrity: ?Goal: Risk for impaired skin integrity will decrease ?Outcome: Progressing ?  ?

## 2022-01-23 NOTE — Plan of Care (Signed)

## 2022-01-23 NOTE — Progress Notes (Signed)
Had conversation with patient and he expressed he would prefer 2 Percocet q 4hrs and 750 mg of Robaxin q6 hr. He stated that was how he was scheduled when he was in hospital before and it was effective, Arthor Captain LPN ?

## 2022-01-23 NOTE — Progress Notes (Signed)
Subjective: Seen in room, back pain slightly better, noted surgery canceled(2/2 high risk) HD today pending ? ?Objective ?Vital signs in last 24 hours: ?Vitals:  ? 01/22/22 1858 01/22/22 2131 01/23/22 0500 01/23/22 0902  ?BP: 111/66 124/67 (!) 141/82 127/72  ?Pulse: 94 92 (!) 105 89  ?Resp: '17 18 18 18  '$ ?Temp: 97.8 ?F (36.6 ?C) 98.7 ?F (37.1 ?C) 98.3 ?F (36.8 ?C) (!) 97.5 ?F (36.4 ?C)  ?TempSrc: Oral Axillary Axillary Oral  ?SpO2: 100% 96% 98% 99%  ?Weight:      ?Height:      ? ?Weight change:  ? ?Physical Exam: ?General: Alert, chronically ill-appearing male NAD  ?Heart: RRR no MRG ?Lungs: CTA nonlabored breathing ?Abdomen: Obese NABS, soft, NT ?Extremities: Right BKA slight thigh edema, left Unna boot has some left toe amps ? Dialysis Access: Right arm AV fistula positive bruit ? ?Dialysis Orders: ?TTS - GOC ?4hrs20mn, BFR 400, DFR 800,  EDW 94kg, 3K/ 2.5Ca ?No Systemic Heparin ?Mircera 200 mcg IV q2wks - last 01/11/22 ?Calcitriol 0.289m PO qHD - last 01/13/22 ?  ?Problem/Plan ?1 Lumbar back pain/Radiculopathy: Followed by Spine Surgery Dr. CrSaintclair HalstedMR Lumbar Spine completed: showed fractures L2-L3 with fluid-? Trauma vs. Infection. Neurosurgeon canceled surgery secondary to high risk ?  L2-3 disc aspiration 01/19/2022 culture negative.Continue pain management and Pt/OT ?2 ESRD - on HD TTS. HD today off schedule D/T census and staffing. Next HD 01/23/2022. ?3  Hypertension/volume  -BP stable this a.m. 127/72,currently not volume overloaded. BP stable. Very much under OP EDW if wt is accurate. Lower EDW on discharge. UF as tolerated.  ?4. Anemia of CKD - Hgb 10.2. ESA due 01/25/2022.  60 mcg Aranesp ?5. Secondary Hyperparathyroidism - PO4 at goal. Continue VDRA. ?6. Nutrition - Renal diet on fluid restriction. Albumin very low. Start protein supps, renal vitamins  ?7. DMT2-per primary ?8. 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 Follows with cards. ? ? ?DaErnest HaberPA-C ?[facility] ?Beeper  [phone]/14/2023,12:22 PM ? LOS: 10 days  ? ?Labs: ?Basic Metabolic Panel: ?Recent Labs  ?Lab 01/18/22 ?0337 01/19/22 ?1227 01/20/22 ?0203 01/22/22 ?041027?NA 133* 134* 135 134*  ?K 5.1 4.3 4.2 3.2*  ?CL 97* 95* 98 98  ?CO2 '25 22 28 26  '$ ?GLUCOSE 202* 168* 101* 112*  ?BUN 39* 27* 28* 34*  ?CREATININE 3.56* 2.82* 3.15* 3.23*  ?CALCIUM 8.5* 8.2* 8.2* 8.0*  ?PHOS 4.9*  --  4.1  --   ? ?Liver Function Tests: ?Recent Labs  ?Lab 01/20/22 ?0203  ?ALBUMIN 1.9*  ? ?No results for input(s): LIPASE, AMYLASE in the last 168 hours. ?No results for input(s): AMMONIA in the last 168 hours. ?CBC: ?Recent Labs  ?Lab 01/19/22 ?1651 01/20/22 ?0203 01/21/22 ?0025363/13/23 ?0464403/14/23 ?0357  ?WBC 10.3 9.9 10.2 9.5 10.7*  ?NEUTROABS 8.1* 7.1 7.8* 6.1 7.9*  ?HGB 10.3* 9.7* 10.1* 9.5* 10.2*  ?HCT 34.3* 31.5* 32.7* 30.4* 33.1*  ?MCV 100.0 99.7 99.4 98.4 96.8  ?PLT 131* 115* 123* 120* 128*  ? ?Cardiac Enzymes: ?No results for input(s): CKTOTAL, CKMB, CKMBINDEX, TROPONINI in the last 168 hours. ?CBG: ?Recent Labs  ?Lab 01/22/22 ?0736 01/22/22 ?1155 01/22/22 ?1643 01/22/22 ?2134 01/23/22 ?073474?GLUCAP 124* 110* 151* 97 137*  ? ? ?Studies/Results: ?No results found. ?Medications: ? ? (feeding supplement) PROSource Plus  30 mL Oral BID BM  ? amoxicillin  500 mg Oral Q24H  ? apixaban  5 mg Oral BID  ? atorvastatin  20 mg Oral QHS  ?  calcitRIOL  0.25 mcg Oral Daily  ? Chlorhexidine Gluconate Cloth  6 each Topical Daily  ? Chlorhexidine Gluconate Cloth  6 each Topical Once  ? [START ON 01/25/2022] darbepoetin (ARANESP) injection - DIALYSIS  60 mcg Intravenous Q Thu-HD  ? docusate sodium  100 mg Oral BID  ? doxycycline  100 mg Oral Q12H  ? furosemide  80 mg Oral Once per day on Sun Mon Wed Fri  ? insulin aspart  0-6 Units Subcutaneous TID WC  ? insulin aspart protamine- aspart  8 Units Subcutaneous BID WC  ? lidocaine  1 patch Transdermal Q24H  ? metoprolol tartrate  25 mg Oral BID  ? multivitamin with minerals  1 tablet Oral Once per day on  Mon Thu  ? pantoprazole  40 mg Oral Daily  ? ? ? ? ?

## 2022-01-23 NOTE — TOC Progression Note (Signed)
Transition of Care (TOC) - Initial/Assessment Note  ? ? ?Patient Details  ?Name: Alexander [name] ?MRN: [id number] ?Date of Birth: [birth year] ? ?Transition of Care (TOC) CM/SW Contact:    ?Paulene Floor Carneshia Raker, LCSWA ?Phone Number: ?01/23/2022, 12:45 PM ? ?Clinical Narrative:                 ?CSW spoke with the patient on this date to inquire about discharge plans now that he is not getting surgery.  The patient declined SNF and home health stating that his wife and 2 adult children are at the home and can assist. The patient reports that once his pain is manageable he will be able to get around in his wheel chair.  The patient reported that he has had home health before and does not need it.  The patient's only request is for a hospital bed. ? ?TOC will continue to follow.   ? ?Expected Discharge Plan: Home/Self Care ?Barriers to Discharge: Continued Medical Work up ? ? ?Patient Goals and CMS Choice ?Patient states their goals for this hospitalization and ongoing recovery are:: Pt states he would like to return home after hospitalization. ?CMS Medicare.gov Compare Post Acute Care list provided to:: Patient ?Choice offered to / list presented to : Patient ? ?Expected Discharge Plan and Services ?Expected Discharge Plan: Home/Self Care ?  ?  ?Post Acute Care Choice: Resumption of Svcs/PTA Provider ?Living arrangements for the past 2 months: [place] ?                ?  ?  ?  ?  ?  ?  ?  ?  ?  ?  ? ?Prior Living Arrangements/Services ?Living arrangements for the past 2 months: [place] ?Lives with:: Adult Children, Spouse ?Patient language and need for interpreter reviewed:: Yes ?Do you feel safe going back to the place where you live?: Yes      ?Need for Family Participation in Patient Care: Yes (Comment) ?Care giver support system in place?: Yes (comment) ?Current home services: DME ?Criminal Activity/Legal Involvement Pertinent to Current Situation/Hospitalization: No - Comment as needed ? ?Activities of  Daily Living ?Home Assistive Devices/Equipment: CBG Meter ?ADL Screening (condition at time of admission) ?Patient's cognitive ability adequate to safely complete daily activities?: Yes ?Is the patient deaf or have difficulty hearing?: No ?Does the patient have difficulty seeing, even when wearing glasses/contacts?: No ?Does the patient have difficulty concentrating, remembering, or making decisions?: No ?Patient able to express need for assistance with ADLs?: Yes ?Does the patient have difficulty dressing or bathing?: Yes ?Independently performs ADLs?: No ?Communication: Independent ?Dressing (OT): Needs assistance ?Is this a change from baseline?: Pre-admission baseline ?Grooming: Independent ?Feeding: Independent ?Bathing: Needs assistance ?Is this a change from baseline?: Change from baseline, expected to last >3 days ?Toileting: Needs assistance ?Is this a change from baseline?: Pre-admission baseline ?In/Out Bed: Needs assistance ?Is this a change from baseline?: Pre-admission baseline ?Does the patient have difficulty walking or climbing stairs?: Yes ?Weakness of Legs: Both ?Weakness of Arms/Hands: Both ? ?Permission Sought/Granted ?Permission sought to share information with : Family Supports ?Permission granted to share information with : No ?   ?   ?   ?   ? ?Emotional Assessment ?Appearance:: Appears stated age ?Attitude/Demeanor/Rapport: Engaged ?Affect (typically observed): Appropriate ?Orientation: : Oriented to Self, Oriented to Place, Oriented to  Time, Oriented to Situation ?Alcohol / Substance Use: Not Applicable ?Psych Involvement: No (comment) ? ?Admission diagnosis:  Intractable back pain [  M54.9] ?Patient Active Problem List  ? Diagnosis Date Noted  ? Endocarditis 01/21/2022  ? Ulcer of right leg, limited to breakdown of skin ([place])   ? Below-knee amputation of right lower extremity ([place])   ? Ulcer of left foot, limited to breakdown of skin ([place])   ? History of transmetatarsal amputation of left  foot ([place])   ? Lumbar back pain with radiculopathy affecting left lower extremity 01/14/2022  ? Dyslipidemia 01/14/2022  ? Type 2 diabetes mellitus with chronic kidney disease, with long-term current use of insulin ([place]) 01/14/2022  ? GERD without esophagitis 01/14/2022  ? Pressure injury of skin 01/14/2022  ? Intractable back pain 01/13/2022  ? Blister 11/08/2021  ? Malnutrition of moderate degree 11/02/2021  ? Pacemaker infection ([place])   ? Transaminitis   ? Tricuspid valve vegetation   ? Thrombocytopenia ([place]) 10/26/2021  ? Hyponatremia 10/26/2021  ? Acute on chronic diastolic CHF (congestive heart failure) ([place]) 10/26/2021  ? Atrial fibrillation with RVR ([place]) 10/25/2021  ? Symptomatic anemia 09/30/2021  ? ESRD (end stage renal disease) ([place]) 09/30/2021  ? PAF (paroxysmal atrial fibrillation) ([place]) 09/30/2021  ? NSVT (nonsustained ventricular tachycardia)   ? Pancytopenia ([place])   ? Acute blood loss anemia 07/18/2020  ? Hemodialysis patient ([place]) 06/04/2020  ? Necrotizing glomerulonephritis 06/04/2020  ? Heme positive stool   ? Melena   ? Upper GI bleeding 05/23/2020  ? Acute GI bleeding 05/23/2020  ? Acute hematogenous osteomyelitis of right foot ([place]) 03/11/2020  ? Gangrene of right foot ([place])   ? Diabetic foot ulcer associated with type 2 diabetes mellitus ([place]) 02/19/2020  ? Heel ulcer ([place]) 11/05/2019  ? Atherosclerotic PVD with ulceration ([place]) 09/09/2017  ? Essential hypertension, benign 02/07/2015  ? Renal insufficiency 02/07/2015  ? Post PTCA 11/09/2014  ? S/P PTCA (percutaneous transluminal coronary angioplasty) 11/09/2014  ? Pacemaker: Devola DR MRI [id number] Dual chamber pacemaker 10/08/2014 10/08/2014  ? Encounter for care of pacemaker 10/08/2014  ? Sinus node dysfunction ([place]) 10/08/2014  ? Cardiac asystole ([place]) 10/07/2014  ? Cardiac syncope 10/07/2014  ? Diabetes mellitus with stage 3 chronic kidney disease ([place]) 10/07/2014  ? CAD (coronary artery disease), native coronary artery 10/07/2014   ? Diabetes mellitus due to underlying condition without complications ([place]) 41/93/7902  ? Anemia, unspecified 05/25/2014  ? Syncope 04/30/2014  ? Atherosclerosis of native arteries of the extremities with gangrene ([place]) 12/16/2013  ? Volume overload 10/30/2013  ? Acute renal failure ([place]) 10/28/2013  ? PVD (peripheral vascular disease) ([place]) 10/27/2013  ? Leukocytosis 10/21/2013  ? Anemia 10/21/2013  ? Osteomyelitis of left foot ([place]) 08/19/2013  ? Spinal stenosis, lumbar region, with neurogenic claudication 06/12/2012  ?  Class: Diagnosis of  ? Diabetes mellitus with end stage renal disease ([place]) 03/15/2009  ? ALCOHOL ABUSE 03/15/2009  ? TOBACCO USE 03/15/2009  ? HYPERTENSION 03/15/2009  ? ?PCP:  [name], MD ?Pharmacy:   ?[facility], [place] ?[address] [place] ?[place] [address] ?Phone: [phone] Fax: [phone] ? ?[id number] [place], [place] - [address]?[place]?Phone: [phone]: [phone]? ?[facility] ?[address]?[place]?Phone: [phone]: [phone] ? ? ? ? ?Social Determinants of Health (SDOH) Interventions ?  ? ?Readmission Risk Interventions ?Readmission Risk Prevention Plan 10/27/2021 05/26/2020 03/15/2020  ?Transportation Screening Complete Complete Complete  ?PCP or Specialist Appt within 5-7 Days - - Complete  ?  PCP or Specialist Appt within 3-5 Days Complete - -  ?Home Care Screening - - Complete  ?Medication Review (RN CM) - - Complete  ?[place] or Home Care Consult Complete - -  ?Social Work Consult for [place] Planning/Counseling Complete - -  ?Palliative Care Screening Not Applicable - -  ?Medication Review Press photographer) Complete Complete -  ?PCP or Specialist appointment within 3-5 days of discharge - Complete -  ?[place] or [place] - Complete -  ?SW Recovery Care/Counseling Consult - Complete  -  ?Palliative Care Screening - Not Applicable -  ?Skilled Nursing Facility - Not Applicable -  ?Some recent data might be hidden  ? ? ? ?

## 2022-01-23 NOTE — Progress Notes (Signed)
?PROGRESS NOTE ? ? ? ?Alexander [name]  SUP:[id number] DOB: Jul 29, 1961 DOA: 01/13/2022 ?PCP: [name], MD  ? ? ?Brief Narrative:  ?Alexander [name] is a 61 y.o. male with medical history significant for ESRD on HD on TTS, HTN, HLD, PVD status post right BKA and left midfoot amputation, Afib on Eliquis, CAD, DM2, and status post pacemaker, presented to the ER with worsening intractable low back pain over the last couple of days with associated left lower extremity radiation and radiculopathy with associated spasm and weakness, denies any urinary or stool incontinence. He has been having chronic low back pain with occasional radiation to both lower extremities. Pt has been followed by spine surgery and was scheduled for LS spine MRI on 02/07/2022.  Plan is for disc aspiration and if no infection then patient will have surgical decompression.  Aspiration done 3/10.  Surgery was planned for Monday but decision was made on Monday AM not to pursue further due to risk and chance surgery may not fix.   ?Appreciate Dr. Irven Shelling help with [place].   ?Focus now is on pain control.  TOC asked to help patient establish with pain clinic.    ? ? ?Assessment and Plan: ?* Lumbar back pain with radiculopathy affecting left lower extremity ?CRP, ESR elevated ?CT noted moderate neuroforaminal stenosis at L2-L3 and L5-S1 ?MRI lumbar spine showed subacute fractures L2-L3 disc, partially treated discitis/osteomyelitis is a consideration, severe multifactorial spinal stenosis at L2-3 ?Followed by spine surgery Dr Saintclair Halsted- surgery cancelled as high risk ?IR consulted for disc aspiration to rule out any infection done 3/10- culture negative ?Completed IV decadron ?Pain management as able- plan to change to PO meds- patient prefers percocet instead of PO dilaudid ?PT/OT- recommending SNF- so far patient is refusing ? ?ESRD (end stage renal disease) ([place]) ?HD- T/T/S ?Nephrology consulted ? ?PAF (paroxysmal atrial fibrillation) ([place]) ?Continue  Lopressor and resume eliquis ? ?Essential hypertension, benign ?Continue Lopressor- holding parameters ? ?Dyslipidemia ?- We will continue statin therapy. ? ?Type 2 diabetes mellitus with chronic kidney disease, with long-term current use of insulin ([place]) ?Last A1c 6.8 ?SSI, NovoLog 70/30 twice daily at a lower dose, hypoglycemic protocol, Accu-Cheks ? ?GERD without esophagitis ?Continue PPI therapy. ? ?Endocarditis ?- LVEF is further slightly reduced compared to previous.  Limited echocardiogram hence may be inaccurate.  There appears to be flail tricuspid valve leaflet.  Severe tricuspid regurgitation.  PA pressures has increased compared to previous.  There also appears to be a vegetation on the anterior mitral leaflet.  ? ?Below-knee amputation of right lower extremity ([place]) ?At outpatient visit on 01/10/2022 with orthopedics, noted BLE with significant weeping, edema with induration, both noted with distal maceration with L LE noted open ulceration ?Concern for ischemic changes, placed in Unna and Dynaflex compression wraps bilaterally ?Consulted [name], we will continue with serial compression wraps and outpatient follow-up ? ?Pressure injury of skin ?Pressure Injury 01/14/22 Ischial tuberosity Right Stage 3 -  Full thickness tissue loss. Subcutaneous fat may be visible but bone, tendon or muscle are NOT exposed. (Active)  ?01/14/22 0321  ?Location: Ischial tuberosity  ?Location Orientation: Right  ?Staging: Stage 3 -  Full thickness tissue loss. Subcutaneous fat may be visible but bone, tendon or muscle are NOT exposed.  ?Wound Description (Comments):   ?Present on Admission: Yes  ?   ?Pressure Injury 01/14/22 Buttocks Right Stage 2 -  Partial thickness loss of dermis presenting as a shallow open injury with a red, pink wound bed  without slough. (Active)  ?01/14/22 0322  ?Location: Buttocks  ?Location Orientation: Right  ?Staging: Stage 2 -  Partial thickness loss of dermis presenting as a shallow open injury  with a red, pink wound bed without slough.  ?Wound Description (Comments):   ?Present on Admission: Yes  ?   ?Pressure Injury 01/14/22 Sacrum Mid Stage 2 -  Partial thickness loss of dermis presenting as a shallow open injury with a red, pink wound bed without slough. (Active)  ?01/14/22 0322  ?Location: Sacrum  ?Location Orientation: Mid  ?Staging: Stage 2 -  Partial thickness loss of dermis presenting as a shallow open injury with a red, pink wound bed without slough.  ?Wound Description (Comments):   ?Present on Admission: Yes  ?   ?Pressure Injury 01/14/22 Buttocks Left Stage 2 -  Partial thickness loss of dermis presenting as a shallow open injury with a red, pink wound bed without slough. (Active)  ?01/14/22 0323  ?Location: Buttocks  ?Location Orientation: Left  ?Staging: Stage 2 -  Partial thickness loss of dermis presenting as a shallow open injury with a red, pink wound bed without slough.  ?Wound Description (Comments):   ?Present on Admission: Yes  ?   ?Pressure Injury 01/14/22 Buttocks Posterior;Proximal;Right Stage 2 -  Partial thickness loss of dermis presenting as a shallow open injury with a red, pink wound bed without slough. (Active)  ?01/14/22 0324  ?Location: Buttocks  ?Location Orientation: Posterior;Proximal;Right  ?Staging: Stage 2 -  Partial thickness loss of dermis presenting as a shallow open injury with a red, pink wound bed without slough.  ?Wound Description (Comments):   ?Present on Admission: Yes  ? ? ? ? ? ?Pacemaker infection ([place]) ?Follows with ID outpatient ?-appreciated ID consult: Surgery had decided against intervention -- I agree as he is high risk for bsi and seeding of any foreign body given his chronic ulcers in LE/poor dention/esrd status ?-continue oral suppressive doxycycline and amoxicillin as above indefinitely ?-follow up in id clinic with me on 02/14/22 @ 245pm ?-id signed off ? ? ? ? ? ? ? ? ? ?DVT prophylaxis:  ?apixaban (ELIQUIS) tablet 5 mg  ?  Code Status: Full  Code ?Family Communication: none ? ?Disposition Plan:  ?Level of care: Med-Surg ?Status is: Inpatient ?Remains inpatient appropriate because: pain control ?  ? ?Consultants:  ?NS ?Palliative care ?nephrology ? ? ?Subjective: ?Patient prefers precocet ? ?Objective: ?Vitals:  ? 01/22/22 2131 01/23/22 0500 01/23/22 0902 01/23/22 1421  ?BP: 124/67 (!) 141/82 127/72 113/66  ?Pulse: 92 (!) 105 89   ?Resp: _0 ?Temp: 98.7 ?F (37.1 ?C) 98.3 ?F (36.8 ?C) (!) 97.5 ?F (36.4 ?C) 97.9 ?F (36.6 ?C)  ?TempSrc: Axillary Axillary Oral Oral  ?SpO2: 96% 98% 99%   ?Weight:    91.6 kg  ?Height:      ? ? ?Intake/Output Summary (Last 24 hours) at 01/23/2022 1437 ?Last data filed at 01/22/2022 1900 ?Gross per 24 hour  ?Intake 136 ml  ?Output --  ?Net 136 ml  ? ?Filed Weights  ? 01/20/22 1525 01/21/22 0508 01/23/22 1421  ?Weight: 89.5 kg 90.2 kg 91.6 kg  ? ? ?Examination: ? ? ?General: Appearance:     ?Overweight male in no acute distress  ?   ?Lungs:     respirations unlabored  ?Heart:    Normal heart rate.   ?MS:   Below knee amputation of right lower extremity is noted. Amputation of left foot is noted. Left  amputation  noted. Right  amputation noted.   ?Neurologic:   Awake, alert, oriented x 3. No apparent focal neurological           defect.   ?  ?  ? ? ? ?Data Reviewed: I have personally reviewed following labs and imaging studies ? ?CBC: ?Recent Labs  ?Lab 01/19/22 ?1651 01/20/22 ?0203 01/21/22 ?5562 01/22/22 ?3921 01/23/22 ?0357  ?WBC 10.3 9.9 10.2 9.5 10.7*  ?NEUTROABS 8.1* 7.1 7.8* 6.1 7.9*  ?HGB 10.3* 9.7* 10.1* 9.5* 10.2*  ?HCT 34.3* 31.5* 32.7* 30.4* 33.1*  ?MCV 100.0 99.7 99.4 98.4 96.8  ?PLT 131* 115* 123* 120* 128*  ? ?Basic Metabolic Panel: ?Recent Labs  ?Lab 01/17/22 ?0232 01/18/22 ?0337 01/19/22 ?1227 01/20/22 ?0203 01/22/22 ?5158  ?NA 133* 133* 134* 135 134*  ?K 3.9 5.1 4.3 4.2 3.2*  ?CL 98 97* 95* 98 98  ?CO2 _0 ?GLUCOSE 241* 202* 168* 101* 112*  ?BUN 27* 39* 27* 28* 34*  ?CREATININE 2.92* 3.56* 2.82*  3.15* 3.23*  ?CALCIUM 8.3* 8.5* 8.2* 8.2* 8.0*  ?PHOS  --  4.9*  --  4.1  --   ? ?GFR: ?Estimated Creatinine Clearance: 25.7 mL/min (A) (by C-G formula based on SCr of 3.23 mg/dL (H)). ?Liver Function Tests: ?

## 2022-01-24 DIAGNOSIS — M5416 Radiculopathy, lumbar region: Secondary | ICD-10-CM | POA: Diagnosis not present

## 2022-01-24 LAB — AEROBIC/ANAEROBIC CULTURE W GRAM STAIN (SURGICAL/DEEP WOUND): Culture: NO GROWTH

## 2022-01-24 LAB — GLUCOSE, CAPILLARY
Glucose-Capillary: 135 mg/dL — ABNORMAL HIGH (ref 70–99)
Glucose-Capillary: 133 mg/dL — ABNORMAL HIGH (ref 70–99)
Glucose-Capillary: 132 mg/dL — ABNORMAL HIGH (ref 70–99)
Glucose-Capillary: 92 mg/dL (ref 70–99)

## 2022-01-24 LAB — CBC WITH DIFFERENTIAL/PLATELET
Abs Immature Granulocytes: 0.04 10*3/uL (ref 0.00–0.07)
Basophils Absolute: 0 10*3/uL (ref 0.0–0.1)
Basophils Relative: 0 %
Eosinophils Absolute: 0.1 10*3/uL (ref 0.0–0.5)
Eosinophils Relative: 1 %
HCT: 32 % — ABNORMAL LOW (ref 39.0–52.0)
Hemoglobin: 9.7 g/dL — ABNORMAL LOW (ref 13.0–17.0)
Immature Granulocytes: 1 %
Lymphocytes Relative: 22 %
Lymphs Abs: 1.9 10*3/uL (ref 0.7–4.0)
MCH: 30.1 pg (ref 26.0–34.0)
MCHC: 30.3 g/dL (ref 30.0–36.0)
MCV: 99.4 fL (ref 80.0–100.0)
Monocytes Absolute: 0.8 10*3/uL (ref 0.1–1.0)
Monocytes Relative: 10 %
Neutro Abs: 5.6 10*3/uL (ref 1.7–7.7)
Neutrophils Relative %: 66 %
Platelets: 133 10*3/uL — ABNORMAL LOW (ref 150–400)
RBC: 3.22 MIL/uL — ABNORMAL LOW (ref 4.22–5.81)
RDW: 17.6 % — ABNORMAL HIGH (ref 11.5–15.5)
WBC: 8.5 10*3/uL (ref 4.0–10.5)
nRBC: 0 % (ref 0.0–0.2)

## 2022-01-24 MED ORDER — METHOCARBAMOL 500 MG PO TABS
750.0000 mg | ORAL_TABLET | Freq: Three times a day (TID) | ORAL | Status: DC | PRN
  Administered 2022-01-24 – 2022-02-01 (×10): 750 mg via ORAL
  Filled 2022-01-24 (×10): qty 2

## 2022-01-24 MED ORDER — TRAMADOL HCL 50 MG PO TABS
50.0000 mg | ORAL_TABLET | Freq: Two times a day (BID) | ORAL | Status: DC | PRN
  Administered 2022-01-30 – 2022-02-13 (×3): 50 mg via ORAL
  Filled 2022-01-24 (×4): qty 1

## 2022-01-24 MED ORDER — GABAPENTIN 100 MG PO CAPS
100.0000 mg | ORAL_CAPSULE | Freq: Three times a day (TID) | ORAL | Status: DC
  Administered 2022-01-24 (×2): 100 mg via ORAL
  Filled 2022-01-24 (×2): qty 1

## 2022-01-24 MED ORDER — HYDROMORPHONE HCL 2 MG PO TABS
1.0000 mg | ORAL_TABLET | ORAL | Status: DC | PRN
  Administered 2022-01-24 – 2022-01-25 (×5): 1 mg via ORAL
  Filled 2022-01-24 (×5): qty 1

## 2022-01-24 MED ORDER — FENTANYL CITRATE PF 50 MCG/ML IJ SOSY
12.5000 ug | PREFILLED_SYRINGE | INTRAMUSCULAR | Status: DC | PRN
Start: 2022-01-24 — End: 2022-01-25

## 2022-01-24 NOTE — Progress Notes (Signed)
? Alexander [name]  YOV:[id number] DOB: 01-18-1961 DOA: 01/13/2022 ?PCP: [name], MD   ? ?Brief Narrative:  ?61 year old with a history of ESRD on HD TTS, HTN, HLD, PVD status post right BKA and left midfoot amputation, A-fib on chronic Eliquis, CAD, DM2, and pacemaker placement who presented to the ED with intractable low back pain over several days with lower extremity radiation and radiculopathy. ? ?The patient underwent MRI with aspiration of a disc space 3/10.  Initially there was a plan for the patient to go to surgery for decompression of an area of spinal stenosis, but ultimately it was felt that his risk for surgery was too high and the decision was made to cancel surgery. ? ?Consultants:  ?Cardiology -Dr. [name] ?Neurosurgery ?Nephrology ?IR ? ?Code Status: FULL CODE ? ?DVT prophylaxis: ?Eliquis  ? ?Interim Hx: ?Afebrile.  Vital signs stable.  Severe ongoing low back pain with uncontrolled radicular pain.  Denies chest pain or shortness of breath.  Fully alert and oriented. ? ?Assessment & Plan: ? ?Lumbar back pain - L2-3 severe spinal stenosis - radiculopathy of left lower extremity ?CT noted moderate neuroforaminal stenosis at L2-3 and L5-S1 -MRI lumbar spine noted subacute fractures L2-L3 disc and severe multifactorial spinal stenosis at L2-3 -followed by Dr. Saintclair Halsted with neurosurgery -surgical intervention canceled due to high risk -IR completed disc aspiration 3/10 with no evidence of infection -has completed the course of IV Decadron -goal at this time is pain management -PT/OT recommend SNF but patient refusing -add Neurontin for neuropathic pain -adjust narcotic for low back pain as able ? ?ESRD on HD TTS ?Nephrology attending to dialysis ? ?Paroxysmal atrial fibrillation ?Continue Lopressor and Eliquis ? ?HTN ?Blood pressure well controlled ? ?Hyperlipidemia ?Continue statin ? ?DM 2 ?CBG well controlled ? ?Chronic pacemaker infection - Severe tricuspid regurgitation with flail tricuspid valve  leaflet - possible vegetation anterior mitral leaflet ?Followed by ID in outpatient setting -continue suppressive therapy with doxycycline and amoxicillin indefinitely ? ?Pressure Injury 01/14/22 Ischial tuberosity Right Stage 3 -  Full thickness tissue loss. Subcutaneous fat may be visible but bone, tendon or muscle are NOT exposed. (Active)  ?01/14/22 0321  ?Location: Ischial tuberosity  ?Location Orientation: Right  ?Staging: Stage 3 -  Full thickness tissue loss. Subcutaneous fat may be visible but bone, tendon or muscle are NOT exposed.  ?Wound Description (Comments):   ?Present on Admission: Yes  ?   ?Pressure Injury 01/14/22 Buttocks Right Stage 2 -  Partial thickness loss of dermis presenting as a shallow open injury with a red, pink wound bed without slough. (Active)  ?01/14/22 0322  ?Location: Buttocks  ?Location Orientation: Right  ?Staging: Stage 2 -  Partial thickness loss of dermis presenting as a shallow open injury with a red, pink wound bed without slough.  ?Wound Description (Comments):   ?Present on Admission: Yes  ?   ?Pressure Injury 01/14/22 Sacrum Mid Stage 2 -  Partial thickness loss of dermis presenting as a shallow open injury with a red, pink wound bed without slough. (Active)  ?01/14/22 0322  ?Location: Sacrum  ?Location Orientation: Mid  ?Staging: Stage 2 -  Partial thickness loss of dermis presenting as a shallow open injury with a red, pink wound bed without slough.  ?Wound Description (Comments):   ?Present on Admission: Yes  ?   ?Pressure Injury 01/14/22 Buttocks Left Stage 2 -  Partial thickness loss of dermis presenting as a shallow open injury with a red, pink wound bed without slough. (  Active)  ?01/14/22 0323  ?Location: Buttocks  ?Location Orientation: Left  ?Staging: Stage 2 -  Partial thickness loss of dermis presenting as a shallow open injury with a red, pink wound bed without slough.  ?Wound Description (Comments):   ?Present on Admission: Yes  ?   ?Pressure Injury 01/14/22  Buttocks Posterior;Proximal;Right Stage 2 -  Partial thickness loss of dermis presenting as a shallow open injury with a red, pink wound bed without slough. (Active)  ?01/14/22 0324  ?Location: Buttocks  ?Location Orientation: Posterior;Proximal;Right  ?Staging: Stage 2 -  Partial thickness loss of dermis presenting as a shallow open injury with a red, pink wound bed without slough.  ?Wound Description (Comments):   ?Present on Admission: Yes  ? ? ?Family Communication:  ?Disposition: SNF recommended but patient refusing -also refusing HH ? ?Objective: ?Blood pressure 111/79, pulse 86, temperature 97.6 ?F (36.4 ?C), temperature source Oral, resp. rate 18, height 6' (1.829 m), weight 88.5 kg, SpO2 100 %. ? ?Intake/Output Summary (Last 24 hours) at 01/24/2022 1058 ?Last data filed at 01/24/2022 0900 ?Gross per 24 hour  ?Intake 1280 ml  ?Output 2655 ml  ?Net -1375 ml  ? ?Filed Weights  ? 01/21/22 0508 01/23/22 1421 01/23/22 1905  ?Weight: 90.2 kg 91.6 kg 88.5 kg  ? ? ?Examination: ?General: No acute respiratory distress ?Lungs: Clear to auscultation bilaterally without wheezes or crackles ?Cardiovascular: Regular rate and rhythm without murmur gallop or rub normal S1 and S2 ?Abdomen: Nontender, nondistended, soft, bowel sounds positive, no rebound, no ascites, no appreciable mass ?Extremities: No significant cyanosis, clubbing, or edema bilateral lower extremities ? ?CBC: ?Recent Labs  ?Lab 01/22/22 ?1761 01/23/22 ?0357 01/24/22 ?6073  ?WBC 9.5 10.7* 8.5  ?NEUTROABS 6.1 7.9* 5.6  ?HGB 9.5* 10.2* 9.7*  ?HCT 30.4* 33.1* 32.0*  ?MCV 98.4 96.8 99.4  ?PLT 120* 128* 133*  ? ?Basic Metabolic Panel: ?Recent Labs  ?Lab 01/18/22 ?0337 01/19/22 ?1227 01/20/22 ?0203 01/22/22 ?7106  ?NA 133* 134* 135 134*  ?K 5.1 4.3 4.2 3.2*  ?CL 97* 95* 98 98  ?CO2 '25 22 28 26  '$ ?GLUCOSE 202* 168* 101* 112*  ?BUN 39* 27* 28* 34*  ?CREATININE 3.56* 2.82* 3.15* 3.23*  ?CALCIUM 8.5* 8.2* 8.2* 8.0*  ?PHOS 4.9*  --  4.1  --   ? ?GFR: ?Estimated  Creatinine Clearance: 25.7 mL/min (A) (by C-G formula based on SCr of 3.23 mg/dL (H)). ? ?Liver Function Tests: ?Recent Labs  ?Lab 01/20/22 ?0203  ?ALBUMIN 1.9*  ? ? ?Coagulation Profile: ?Recent Labs  ?Lab 01/18/22 ?2694  ?INR 1.4*  ? ? ?HbA1C: ?Hgb A1c MFr Bld  ?Date/Time Value Ref Range Status  ?10/29/2021 08:52 AM 6.8 (H) 4.8 - 5.6 % Final  ?  Comment:  ?  (NOTE) ?Pre diabetes:          5.7%-6.4% ? ?Diabetes:              >6.4% ? ?Glycemic control for   <7.0% ?adults with diabetes ?  ?05/23/2020 07:55 PM 5.5 4.8 - 5.6 % Final  ?  Comment:  ?  (NOTE) ?Pre diabetes:          5.7%-6.4% ? ?Diabetes:              >6.4% ? ?Glycemic control for   <7.0% ?adults with diabetes ?  ? ? ?CBG: ?Recent Labs  ?Lab 01/22/22 ?2134 01/23/22 ?0721 01/23/22 ?1248 01/23/22 ?2040 01/24/22 ?0805  ?GLUCAP 97 137* 157* 73 135*  ? ? ?Scheduled Meds: ? (feeding  supplement) PROSource Plus  30 mL Oral BID BM  ? amoxicillin  500 mg Oral Q24H  ? apixaban  5 mg Oral BID  ? atorvastatin  20 mg Oral QHS  ? calcitRIOL  0.25 mcg Oral Daily  ? Chlorhexidine Gluconate Cloth  6 each Topical Daily  ? Chlorhexidine Gluconate Cloth  6 each Topical Once  ? [START ON 01/25/2022] darbepoetin (ARANESP) injection - DIALYSIS  60 mcg Intravenous Q Thu-HD  ? docusate sodium  100 mg Oral BID  ? doxycycline  100 mg Oral Q12H  ? furosemide  80 mg Oral Once per day on Sun Mon Wed Fri  ? insulin aspart  0-6 Units Subcutaneous TID WC  ? insulin aspart protamine- aspart  8 Units Subcutaneous BID WC  ? lidocaine  1 patch Transdermal Q24H  ? metoprolol tartrate  25 mg Oral BID  ? multivitamin with minerals  1 tablet Oral Once per day on Mon Thu  ? pantoprazole  40 mg Oral Daily  ? ? ? LOS: 11 days  ? ?Cherene Altes, MD ?[facility] ?Office  [phone] ?Pager - Text Page per Shea Evans ? ?If 7PM-7AM, please contact night-coverage per Amion ?01/24/2022, 10:58 AM ? ? ? ?

## 2022-01-24 NOTE — Progress Notes (Signed)
Dialysis Orders: ?TTS - GOC ?4hrs63mn, BFR 400, DFR 800,  EDW 94kg (will need to decr weight upon d/c), 3K/ 2.5Ca ?No Systemic Heparin ?Mircera 200 mcg IV q2wks - last 01/11/22 ?Calcitriol 0.210m PO qHD - last 01/13/22 ?  ?Problem/Plan ?1 Lumbar back pain/Radiculopathy: Followed by Spine Surgery Dr. CrSaintclair HalstedMR Lumbar Spine completed: showed fractures L2-L3 with fluid-? Trauma vs. Infection. Neurosurgeon canceled surgery secondary to high risk ?  L2-3 disc aspiration 01/19/2022 culture negative.Continue pain management and Pt/OT. Eventually as outpt either will need PMD or pain clinic to manage. ? ?2 ESRD - on HD TTS. HD 01/23/2022 w/ net 2.7L. Next HD Thur. ? ?3  Hypertension/volume  -BP stable this a.m. 100/63,currently not volume overloaded. BP lower but asymp. Very much under OP EDW if wt is accurate. Lower EDW on discharge. UF as tolerated.  ?4. Anemia of CKD - Hgb 10.2. ESA due 01/25/2022.  60 mcg Aranesp due on Thur ?5. Secondary Hyperparathyroidism - PO4 at goal (4.1 on 3/11). Continue VDRA. ?6. Nutrition - Renal diet on fluid restriction. Albumin very low. Start protein supps, renal vitamins  ?7. DMT2-per primary ?8. 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 Follows with cards. ? ?Subjective: Seen in room, back pain slightly better, noted surgery canceled(2/2 high risk), tolerated HD yesterday. ? ?Objective ?Vital signs in last 24 hours: ?Vitals:  ? 01/23/22 1905 01/23/22 1953 01/23/22 2039 01/24/22 0411  ?BP: 107/67 112/66 101/69 100/63  ?Pulse: 96 92 90 79  ?Resp: '18 16 18 16  '$ ?Temp: 97.8 ?F (36.6 ?C) 97.8 ?F (36.6 ?C) 97.9 ?F (36.6 ?C) 97.7 ?F (36.5 ?C)  ?TempSrc: Oral  Oral   ?SpO2:  100% 97% 99%  ?Weight: 88.5 kg     ?Height:      ? ?Weight change:  ? ?Physical Exam: ?General: Alert, chronically ill-appearing male NAD  ?Heart: RRR no MRG ?Lungs: CTA nonlabored breathing ?Abdomen: Obese NABS, soft, NT ?Extremities: Right BKA slight thigh edema, left Unna boot has some left toe amps ? Dialysis Access: Right  arm AV fistula positive bruit ? ? ? ?01/24/2022,8:48 AM ? LOS: 11 days  ? ?Labs: ?Basic Metabolic Panel: ?Recent Labs  ?Lab 01/18/22 ?0337 01/19/22 ?1227 01/20/22 ?0203 01/22/22 ?047026?NA 133* 134* 135 134*  ?K 5.1 4.3 4.2 3.2*  ?CL 97* 95* 98 98  ?CO2 '25 22 28 26  '$ ?GLUCOSE 202* 168* 101* 112*  ?BUN 39* 27* 28* 34*  ?CREATININE 3.56* 2.82* 3.15* 3.23*  ?CALCIUM 8.5* 8.2* 8.2* 8.0*  ?PHOS 4.9*  --  4.1  --   ? ?Liver Function Tests: ?Recent Labs  ?Lab 01/20/22 ?0203  ?ALBUMIN 1.9*  ? ?No results for input(s): LIPASE, AMYLASE in the last 168 hours. ?No results for input(s): AMMONIA in the last 168 hours. ?CBC: ?Recent Labs  ?Lab 01/20/22 ?0203 01/21/22 ?0037853/13/23 ?0488503/14/23 ?0357 01/24/22 ?022774?WBC 9.9 10.2 9.5 10.7* 8.5  ?NEUTROABS 7.1 7.8* 6.1 7.9* 5.6  ?HGB 9.7* 10.1* 9.5* 10.2* 9.7*  ?HCT 31.5* 32.7* 30.4* 33.1* 32.0*  ?MCV 99.7 99.4 98.4 96.8 99.4  ?PLT 115* 123* 120* 128* 133*  ? ?Cardiac Enzymes: ?No results for input(s): CKTOTAL, CKMB, CKMBINDEX, TROPONINI in the last 168 hours. ?CBG: ?Recent Labs  ?Lab 01/22/22 ?2134 01/23/22 ?0721 01/23/22 ?1248 01/23/22 ?2040 01/24/22 ?0805  ?GLUCAP 97 137* 157* 73 135*  ? ? ?Studies/Results: ?No results found. ?Medications: ? ? (feeding supplement) PROSource Plus  30 mL Oral BID BM  ? amoxicillin  500 mg Oral Q24H  ?  apixaban  5 mg Oral BID  ? atorvastatin  20 mg Oral QHS  ? calcitRIOL  0.25 mcg Oral Daily  ? Chlorhexidine Gluconate Cloth  6 each Topical Daily  ? Chlorhexidine Gluconate Cloth  6 each Topical Once  ? [START ON 01/25/2022] darbepoetin (ARANESP) injection - DIALYSIS  60 mcg Intravenous Q Thu-HD  ? docusate sodium  100 mg Oral BID  ? doxycycline  100 mg Oral Q12H  ? furosemide  80 mg Oral Once per day on Sun Mon Wed Fri  ? insulin aspart  0-6 Units Subcutaneous TID WC  ? insulin aspart protamine- aspart  8 Units Subcutaneous BID WC  ? lidocaine  1 patch Transdermal Q24H  ? metoprolol tartrate  25 mg Oral BID  ? multivitamin with minerals  1 tablet  Oral Once per day on Mon Thu  ? pantoprazole  40 mg Oral Daily  ? ? ? ? ?

## 2022-01-24 NOTE — Progress Notes (Signed)
Physical Therapy Treatment ?Patient Details ?Name: Alexander [name] ?MRN: [id number] ?DOB: [birth year] ?Today's Date: 01/24/2022 ? ? ?History of Present Illness The pt is a 61 yo male presenting 3/4 with chronic back pain and weakness to LLE in addition to frequent falls (x2 on day of admission). PMH: ESRD on HD TTS, paroxysmal atrial fibrillation on Eliquis, pacemaker, CAD s/p stent, diastolic CHF, PVD with right BKA and left midfoot amputation, history of GI bleed, and DM II. ? ?  ?PT Comments  ? ? Began session with pt education about the importance of mobility and regaining the strength to transfer out of the bed into [facility] or recliner with sliding board. Based on previous notes, PT anticipated that pt would be hesitant about moving due to fear of causing back pain. Pt reported that he was comfortable in the position that he was lying but felt as though if he moved it would cause back spasms and extreme pain. Pt allowed PT to lift his left leg and a contraction was able to be felt in his calf as well as visible in his quad. Pt did not demonstrate an increase in pain with this motion. Pt reported that he has lost strength in his LLE and feels as though he can't use it to transfer as he did before falling. Upon lifting pt's LLE there was drainage coming from the lower leg wrap.  ? ?Pt reiterated that he is hoping to get a hospital bed for home but deferred other questions about home to be asked of his wife who was not present. Pt reported that in order to get to dialysis he uses a slide board but since falling, he has not had the strength to lift his leg. We discussed the option of medical Lucianne Lei transport with a WC lift to decrease the need for car transfers; pt was open to the idea. PT proposed and demonstrated the option of a mechanical lift but the pt was not willing to entertain the idea and stated that he would rather use the slide board.  ? ?Pt asked several times for a change to his pain meds and said that he would  need a decrease in pain before attempting to move. Pt reported that next session, if his pain has reduced, that he would demonstrate his ability to move OOB into recliner with sliding board in preparation for discharge. Pt reported concern for the height difference between the bed and chair and we assured him that the chair could be padded to increase the height.  ? ?Pt's pain appeared to be the biggest barrier to his participation in the session. PT session was planned around pain meds administration. Although the pt did not actively participate in this session, he was engaged and persistent that once his pain was managed he would be willing to move. Pt will benefit from skilled PT to maximize safety and independence in mobility and ADLs.    ?Recommendations for follow up therapy are one component of a multi-disciplinary discharge planning process, led by the attending physician.  Recommendations may be updated based on patient status, additional functional criteria and insurance authorization. ? ?Follow Up Recommendations ? Skilled nursing-short term rehab (<3 hours/day) ?  ?  ?Assistance Recommended at Discharge Frequent or constant Supervision/Assistance  ?Patient can return home with the following Two people to help with walking and/or transfers;A lot of help with bathing/dressing/bathroom;Assistance with cooking/housework;Direct supervision/assist for medications management;Assist for transportation;Help with stairs or ramp for entrance ?  ?Equipment Recommendations ? Hospital  bed;Other (comment) (Mechanical lift and medical Lucianne Lei transport with WC lift)  ?  ?Recommendations for Other Services   ? ? ?  ?Precautions / Restrictions    ?  ? ?Mobility ? Bed Mobility ?  ?  ?  ?  ?  ?  ?  ?  ?  ? ?Transfers ?  ?  ?  ?  ?  ?  ?  ?  ?  ?  ?  ? ?Ambulation/Gait ?  ?  ?  ?  ?  ?  ?  ?  ? ? ?Stairs ?  ?  ?  ?  ?  ? ? ?Wheelchair Mobility ?  ? ?Modified Rankin (Stroke Patients Only) ?  ? ? ?  ?Balance   ?  ?  ?  ?  ?  ?  ?   ?  ?  ?  ?  ?  ?  ?  ?  ?  ?  ?  ?  ? ?  ?Cognition Arousal/Alertness: Awake/alert ?  ?Overall Cognitive Status: Within Functional Limits for tasks assessed ?  ?  ?  ?  ?  ?  ?  ?  ?  ?  ?  ?  ?  ?  ?  ?  ?  ?  ?  ? ?  ?Exercises   ? ?  ?General Comments   ?  ?  ? ?Pertinent Vitals/Pain Pain Assessment ?Pain Assessment: Faces ?Faces Pain Scale: Hurts little more ?Pain Location: back (Pt did not want to move due to anticipation of pain. Did not give a numbered pain rating but did say he was in pain.) ?Pain Descriptors / Indicators: Hervey Ard, [place]  ? ? ?Home Living   ?  ?  ?  ?  ?  ?  ?  ?  ?  ?   ?  ?Prior Function    ?  ?  ?   ? ?PT Goals (current goals can now be found in the care plan section) Acute Rehab PT Goals ?Patient Stated Goal: Reduce pain to be able to go home. ?PT Goal Formulation: With patient ?Time For Goal Achievement: 01/28/22 ?Potential to Achieve Goals: Fair ?Progress towards PT goals: Progressing toward goals (Seemed slightly more open to more possibilities) ? ?  ?Frequency ? ? ? Min 3X/week ? ? ? ?  ?PT Plan Current plan remains appropriate  ? ? ?Co-evaluation   ?  ?  ?  ?  ? ?  ?AM-PAC PT "6 Clicks" Mobility   ?Outcome Measure ? Help needed turning from your back to your side while in a flat bed without using bedrails?: A Lot ?Help needed moving from lying on your back to sitting on the side of a flat bed without using bedrails?: Total ?Help needed moving to and from a bed to a chair (including a wheelchair)?: Total ?Help needed standing up from a chair using your arms (e.g., wheelchair or bedside chair)?: Total ?Help needed to walk in hospital room?: Total ?Help needed climbing 3-5 steps with a railing? : Total ?6 Click Score: 7 ? ?  ?End of Session Equipment Utilized During Treatment:  (Demonstrated use of a mechanical lift using the Maximove) ?  ?Patient left: in bed;with call bell/phone within reach ?  ?PT Visit Diagnosis: Pain;Unsteadiness on feet (R26.81);Repeated falls (R29.6) ?Pain -  part of body:  (LBP with occasional spasm and radicular pain down BLEs) ?  ? ? ?Time: 3536-1443 ?PT Time Calculation (min) (ACUTE ONLY): 40  min ? ?Charges:  $Therapeutic Activity: 8-22 mins ?$Self Care/Home Management: 23-37          ?          ? ?Roxy Horseman, SPT  ?Acute Rehab Services ?Office [phone] ? ? ?Roxy Horseman ?01/24/2022, 2:34 PM ? ?

## 2022-01-25 ENCOUNTER — Telehealth: Payer: Self-pay | Admitting: Orthopedic Surgery

## 2022-01-25 DIAGNOSIS — I70209 Unspecified atherosclerosis of native arteries of extremities, unspecified extremity: Secondary | ICD-10-CM | POA: Diagnosis not present

## 2022-01-25 DIAGNOSIS — E43 Unspecified severe protein-calorie malnutrition: Secondary | ICD-10-CM

## 2022-01-25 DIAGNOSIS — I96 Gangrene, not elsewhere classified: Secondary | ICD-10-CM

## 2022-01-25 DIAGNOSIS — T8781 Dehiscence of amputation stump: Secondary | ICD-10-CM | POA: Diagnosis not present

## 2022-01-25 DIAGNOSIS — M5416 Radiculopathy, lumbar region: Secondary | ICD-10-CM | POA: Diagnosis not present

## 2022-01-25 LAB — GLUCOSE, CAPILLARY
Glucose-Capillary: 84 mg/dL (ref 70–99)
Glucose-Capillary: 198 mg/dL — ABNORMAL HIGH (ref 70–99)
Glucose-Capillary: 182 mg/dL — ABNORMAL HIGH (ref 70–99)

## 2022-01-25 LAB — RENAL FUNCTION PANEL
Albumin: 2.2 g/dL — ABNORMAL LOW (ref 3.5–5.0)
Anion gap: 13 (ref 5–15)
BUN: 27 mg/dL — ABNORMAL HIGH (ref 8–23)
CO2: 25 mmol/L (ref 22–32)
Calcium: 8.3 mg/dL — ABNORMAL LOW (ref 8.9–10.3)
Chloride: 98 mmol/L (ref 98–111)
Creatinine, Ser: 2.81 mg/dL — ABNORMAL HIGH (ref 0.61–1.24)
GFR, Estimated: 24 mL/min — ABNORMAL LOW (ref >60)
Glucose, Bld: 116 mg/dL — ABNORMAL HIGH (ref 70–99)
Phosphorus: 4.6 mg/dL (ref 2.5–4.6)
Potassium: 3.7 mmol/L (ref 3.5–5.1)
Sodium: 136 mmol/L (ref 135–145)

## 2022-01-25 LAB — CBC
HCT: 31.3 % — ABNORMAL LOW (ref 39.0–52.0)
Hemoglobin: 9.6 g/dL — ABNORMAL LOW (ref 13.0–17.0)
MCH: 30.1 pg (ref 26.0–34.0)
MCHC: 30.7 g/dL (ref 30.0–36.0)
MCV: 98.1 fL (ref 80.0–100.0)
Platelets: 157 10*3/uL (ref 150–400)
RBC: 3.19 MIL/uL — ABNORMAL LOW (ref 4.22–5.81)
RDW: 17.4 % — ABNORMAL HIGH (ref 11.5–15.5)
WBC: 9.7 10*3/uL (ref 4.0–10.5)
nRBC: 0 % (ref 0.0–0.2)

## 2022-01-25 MED ORDER — FENTANYL CITRATE PF 50 MCG/ML IJ SOSY
12.5000 ug | PREFILLED_SYRINGE | INTRAMUSCULAR | Status: DC | PRN
Start: 2022-01-25 — End: 2022-01-27
  Administered 2022-01-25 – 2022-01-27 (×2): 12.5 ug via INTRAVENOUS
  Filled 2022-01-25 (×2): qty 1

## 2022-01-25 MED ORDER — HYDROMORPHONE HCL 2 MG PO TABS
2.0000 mg | ORAL_TABLET | ORAL | Status: DC | PRN
Start: 2022-01-25 — End: 2022-02-15
  Administered 2022-01-25 – 2022-02-14 (×86): 2 mg via ORAL
  Filled 2022-01-25 (×87): qty 1

## 2022-01-25 MED ORDER — GABAPENTIN 100 MG PO CAPS
200.0000 mg | ORAL_CAPSULE | Freq: Three times a day (TID) | ORAL | Status: DC
  Administered 2022-01-25 – 2022-01-30 (×13): 200 mg via ORAL
  Filled 2022-01-25 (×13): qty 2

## 2022-01-25 NOTE — Telephone Encounter (Signed)
Pt's wife Thayer Headings called stating husband is hospitalized. She states that pt's legs are seaking from compression wraps and asking for [name] to call over there or go see pt. Please call Thayer Headings about this matter at [phone].  ?

## 2022-01-25 NOTE — Telephone Encounter (Signed)
This pt is in the hospital per wife. See message below.  ?

## 2022-01-25 NOTE — Consult Note (Signed)
ORTHOPAEDIC CONSULTATION  REQUESTING PHYSICIAN: Lonia Blood, MD  Chief Complaint: Patient is seen in follow-up for ischemic changes right below-knee amputation and left leg.  Patient states his main complaint is lower back pain. HPI: Alexander [name] is a 61 y.o. male who presents with lower back pain with weakness in the left lower extremity.  Patient has had ischemic changes to the both legs and the right transtibial amputation and left leg were wrapped with compression wraps at his last hospitalization.  Past Medical History:  Diagnosis Date   Acute blood loss anemia    Alcohol abuse    Anemia    Anemia of chronic disease    Arthritis    "patient does not think so."   Atrial fibrillation (HCC)    Cardiac syncope 10/07/2014   rapid A fib with 8 sec pauses on converison with syncope- temp pacing wire placed then PPM   Cataract    BILATERAL   Coronary artery disease    Diabetes mellitus    dx---been  awhile   Elevated LFTs    Endocarditis of tricuspid valve    ESRD (end stage renal disease) (HCC)    [facility]- [facility]   GERD (gastroesophageal reflux disease)    History of blood transfusion    Hyperlipemia    Hypertension    Malnutrition (HCC)    Osteomyelitis (HCC)    right foot   Peripheral vascular disease (HCC)    Presence of permanent cardiac pacemaker 10/08/2014   [employer]   Tricuspid valve vegetation    Past Surgical History:  Procedure Laterality Date   A/V FISTULAGRAM Right 05/01/2021   Procedure: A/V FISTULAGRAM;  Surgeon: Maeola Harman, MD;  Location: [facility] INVASIVE CV LAB;  Service: Cardiovascular;  Laterality: Right;  failed fistulagram   ABDOMINAL AORTOGRAM W/LOWER EXTREMITY N/A 09/09/2017   Procedure: ABDOMINAL AORTOGRAM W/LOWER EXTREMITY;  Surgeon: Chuck Hint, MD;  Location: [facility] INVASIVE CV LAB;  Service: Cardiovascular;  Laterality: N/A;   ABDOMINAL AORTOGRAM W/LOWER EXTREMITY Right 11/11/2019   Procedure: ABDOMINAL  AORTOGRAM W/LOWER EXTREMITY;  Surgeon: Cephus Shelling, MD;  Location: [facility] INVASIVE CV LAB;  Service: Cardiovascular;  Laterality: Right;   AMPUTATION Left 08/19/2013   Procedure: AMPUTATION RAY;  Surgeon: Harvie Junior, MD;  Location: [facility] OR;  Service: Orthopedics;  Laterality: Left;  ray amputation left 5th   AMPUTATION Left 10/27/2013   Procedure: AMPUTATION DIGIT-LEFT 4TH TOE, 4th and 5th metatarsal.;  Surgeon: Chuck Hint, MD;  Location: [facility] OR;  Service: Vascular;  Laterality: Left;   AMPUTATION Left 11/04/2013   Procedure: LEFT FOOT TRANS-METATARSAL AMPUTATION WITH WOUND CLOSURE ;  Surgeon: Harvie Junior, MD;  Location: [facility] OR;  Service: Orthopedics;  Laterality: Left;   AMPUTATION Right 09/10/2017   Procedure: RIGHT FOURTH AND FIFTH TOE AMPUTATION;  Surgeon: Chuck Hint, MD;  Location: [facility] OR;  Service: Vascular;  Laterality: Right;   AMPUTATION Right 02/19/2020   Procedure: AMPUTATION RIGHT 3RD TOE;  Surgeon: Chuck Hint, MD;  Location: [facility] OR;  Service: Vascular;  Laterality: Right;   AMPUTATION Right 03/11/2020   Procedure: RIGHT BELOW KNEE AMPUTATION;  Surgeon: Nadara Mustard, MD;  Location: [facility] OR;  Service: Orthopedics;  Laterality: Right;   APPLICATION OF WOUND VAC Right 02/19/2020   Procedure: Application Of Wound Vac Right foot;  Surgeon: Chuck Hint, MD;  Location: [facility] OR;  Service: Vascular;  Laterality: Right;   AV FISTULA PLACEMENT Right 03/27/2021  Procedure: RIGHT ARM ARTERIOVENOUS (AV) FISTULA;  Surgeon: Chuck Hint, MD;  Location: [facility] OR;  Service: Vascular;  Laterality: Right;   AV FISTULA PLACEMENT Right 05/10/2021   Procedure: RIGHT ARM ARTERIOVENOUS (AV) FISTULA;  Surgeon: Maeola Harman, MD;  Location: [facility] OR;  Service: Vascular;  Laterality: Right;   BACK SURGERY     BASCILIC VEIN TRANSPOSITION Right 08/11/2021   Procedure: RIGHT ARM SECOND STAGE BASILIC VEIN TRANSPOSITION;  Surgeon: Maeola Harman, MD;  Location: [facility] OR;  Service: Vascular;  Laterality: Right;   BIOPSY  07/20/2020   Procedure: BIOPSY;  Surgeon: Lemar Lofty., MD;  Location: [facility] ENDOSCOPY;  Service: Gastroenterology;;   CARDIAC CATHETERIZATION  10/07/2014   Procedure: LEFT HEART CATH AND CORONARY ANGIOGRAPHY;  Surgeon: Pamella Pert, MD;  Location: [facility] CATH LAB;  Service: Cardiovascular;;   COLONOSCOPY W/ POLYPECTOMY     COLONOSCOPY WITH PROPOFOL N/A 05/25/2020   Procedure: COLONOSCOPY WITH PROPOFOL;  Surgeon: Iva Boop, MD;  Location: [facility] ENDOSCOPY;  Service: Endoscopy;  Laterality: N/A;   EMBOLECTOMY Right 09/12/2017   Procedure: Thrombectomy  & Redo Right Below Knee Popliteal Artery Bypass Graft  ;  Surgeon: Maeola Harman, MD;  Location: [facility] OR;  Service: Vascular;  Laterality: Right;   ENTEROSCOPY N/A 07/20/2020   Procedure: ENTEROSCOPY;  Surgeon: Meridee Score Netty Starring., MD;  Location: [facility] ENDOSCOPY;  Service: Gastroenterology;  Laterality: N/A;   ESOPHAGOGASTRODUODENOSCOPY (EGD) WITH PROPOFOL N/A 05/24/2020   Procedure: ESOPHAGOGASTRODUODENOSCOPY (EGD) WITH PROPOFOL;  Surgeon: Meryl Dare, MD;  Location: [facility] ENDOSCOPY;  Service: Endoscopy;  Laterality: N/A;   EYE SURGERY Bilateral    cataract   FEMORAL-TIBIAL BYPASS GRAFT Left 10/27/2013   Procedure: BYPASS GRAFT LEFT FEMORAL- POSTERIOR TIBIAL ARTERY;  Surgeon: Chuck Hint, MD;  Location: [facility] OR;  Service: Vascular;  Laterality: Left;   FEMORAL-TIBIAL BYPASS GRAFT Right 09/10/2017   Procedure: RIGHT SUPERFICIAL  FEMORAL ARTERY-BELOW KNEE POPLITEAL ARTERY BYPASS GRAFT WITH VEIN;  Surgeon: Chuck Hint, MD;  Location: [facility] OR;  Service: Vascular;  Laterality: Right;   GIVENS CAPSULE STUDY N/A 07/20/2020   Procedure: GIVENS CAPSULE STUDY;  Surgeon: Lemar Lofty., MD;  Location: [facility] ENDOSCOPY;  Service: Gastroenterology;  Laterality: N/A;  capsule placed through scope into duodenum @ 0936   I & D  EXTREMITY Left 10/27/2013   Procedure: IRRIGATION AND DEBRIDEMENT EXTREMITY- LEFT FOOT;  Surgeon: Chuck Hint, MD;  Location: [facility] OR;  Service: Vascular;  Laterality: Left;   I & D EXTREMITY Right 01/26/2020   Procedure: IRRIGATION AND DEBRIDEMENT EXTREMITY RIGHT FOOT WOUND;  Surgeon: Chuck Hint, MD;  Location: [facility] OR;  Service: Vascular;  Laterality: Right;   INSERT / REPLACE / REMOVE PACEMAKER     INSERTION OF DIALYSIS CATHETER Right 11/03/2021   Procedure: RIGHT INTERNAL JUGULAR INSERTION OF TUNNELED DIALYSIS CATHETER;  Surgeon: Chuck Hint, MD;  Location: [facility] OR;  Service: Vascular;  Laterality: Right;   INTRAOPERATIVE ARTERIOGRAM Right 09/10/2017   Procedure: INTRA OPERATIVE ARTERIOGRAM;  Surgeon: Chuck Hint, MD;  Location: [facility] OR;  Service: Vascular;  Laterality: Right;   IR ANGIOGRAM FOLLOW UP STUDY  09/09/2017   IR FLUORO GUIDE CV LINE RIGHT  05/31/2020   IR LUMBAR DISC ASPIRATION W/IMG GUIDE  01/19/2022   IR US GUIDE VASC ACCESS RIGHT  06/01/2020   LEFT HEART CATH AND CORONARY ANGIOGRAPHY N/A 08/19/2020   Procedure: LEFT HEART CATH AND CORONARY ANGIOGRAPHY;  Surgeon: Yates Decamp, MD;  Location: [facility] INVASIVE CV  LAB;  Service: Cardiovascular;  Laterality: N/A;   LEFT HEART CATHETERIZATION WITH CORONARY ANGIOGRAM N/A 11/09/2014   Procedure: LEFT HEART CATHETERIZATION WITH CORONARY ANGIOGRAM;  Surgeon: Pamella Pert, MD;  Location: [facility] CATH LAB;  Service: Cardiovascular;  Laterality: N/A;   LOWER EXTREMITY ANGIOGRAM Right 11/08/2015   Procedure: Lower Extremity Angiogram;  Surgeon: Nada Libman, MD;  Location: [facility] INVASIVE CV LAB;  Service: Cardiovascular;  Laterality: Right;   LUMBAR LAMINECTOMY  06/13/2012   Procedure: MICRODISCECTOMY LUMBAR LAMINECTOMY;  Surgeon: Kerrin Champagne, MD;  Location: [facility] OR;  Service: Orthopedics;  Laterality: N/A;  Central laminectomy L2-3, L3-4, L4-5   PACEMAKER INSERTION  10/08/2014   MDT Advisa MRI compatible dual  chamber pacemaker implanted by Dr Graciela Husbands for syncope with post-termination pauses   PERCUTANEOUS CORONARY STENT INTERVENTION (PCI-S)  11/09/2014   des to lad & distal circumflex         Dr  Jacinto Halim   PERIPHERAL VASCULAR BALLOON ANGIOPLASTY Right 11/11/2019   Procedure: PERIPHERAL VASCULAR BALLOON ANGIOPLASTY;  Surgeon: Cephus Shelling, MD;  Location: [facility] INVASIVE CV LAB;  Service: Cardiovascular;  Laterality: Right;  below knee popliteal, tibioperoneal trunk, posterior tibial arteries   PERIPHERAL VASCULAR CATHETERIZATION N/A 11/08/2015   Procedure: Abdominal Aortogram;  Surgeon: Nada Libman, MD;  Location: [facility] INVASIVE CV LAB;  Service: Cardiovascular;  Laterality: N/A;   PERIPHERAL VASCULAR CATHETERIZATION Right 11/08/2015   Procedure: Peripheral Vascular Atherectomy;  Surgeon: Nada Libman, MD;  Location: [facility] INVASIVE CV LAB;  Service: Cardiovascular;  Laterality: Right;   PERIPHERAL VASCULAR CATHETERIZATION N/A 10/10/2016   Procedure: Lower Extremity Angiography;  Surgeon: Maeola Harman, MD;  Location: [facility] INVASIVE CV LAB;  Service: Cardiovascular;  Laterality: N/A;   PERIPHERAL VASCULAR CATHETERIZATION Right 10/10/2016   Procedure: Peripheral Vascular Atherectomy;  Surgeon: Maeola Harman, MD;  Location: [facility] INVASIVE CV LAB;  Service: Cardiovascular;  Laterality: Right;  Popliteal   PERMANENT PACEMAKER INSERTION N/A 10/08/2014   Procedure: PERMANENT PACEMAKER INSERTION;  Surgeon: Duke Salvia, MD;  Location: [facility] CATH LAB;  Service: Cardiovascular;  Laterality: N/A;   RADIOLOGY WITH ANESTHESIA N/A 01/19/2022   Procedure: Disc Aspiration;  Surgeon: Baldemar Lenis, MD;  Location: [facility] OR;  Service: Radiology;  Laterality: N/A;   SUBMUCOSAL TATTOO INJECTION  07/20/2020   Procedure: SUBMUCOSAL TATTOO INJECTION;  Surgeon: Lemar Lofty., MD;  Location: [facility] ENDOSCOPY;  Service: Gastroenterology;;   TEE WITHOUT CARDIOVERSION N/A 10/27/2021   Procedure:  TRANSESOPHAGEAL ECHOCARDIOGRAM (TEE);  Surgeon: Yates Decamp, MD;  Location: [facility] ENDOSCOPY;  Service: Cardiovascular;  Laterality: N/A;   TEMPORARY PACEMAKER INSERTION Bilateral 10/07/2014   Procedure: TEMPORARY PACEMAKER INSERTION;  Surgeon: Pamella Pert, MD;  Location: [facility] CATH LAB;  Service: Cardiovascular;  Laterality: Bilateral;   WOUND DEBRIDEMENT Right 02/19/2020   Procedure: DEBRIDEMENT WOUND RIGHT FOOT;  Surgeon: Chuck Hint, MD;  Location: [facility] OR;  Service: Vascular;  Laterality: Right;   Social History   Socioeconomic History   Marital status: Married    Spouse name: Not on file   Number of children: 2   Years of education: Not on file   Highest education level: Not on file  Occupational History   Occupation: Disabled  Tobacco Use   Smoking status: Former    Packs/day: 1.50    Years: 30.00    Pack years: 45.00    Types: Cigarettes    Quit date: 10/07/2014    Years since quitting: 7.3   Smokeless tobacco: Never  Tobacco comments:    Does an occasional cigar  Vaping Use   Vaping Use: Never used  Substance and Sexual Activity   Alcohol use: Not Currently    Alcohol/week: 21.0 standard drinks    Types: 21 Glasses of wine per week    Comment: cut way back, none in the last month   Drug use: No   Sexual activity: Not on file  Other Topics Concern   Not on file  Social History Narrative   Not on file   Social Determinants of Health   Financial Resource Strain: Not on file  Food Insecurity: Not on file  Transportation Needs: Not on file  Physical Activity: Not on file  Stress: Not on file  Social Connections: Not on file   Family History  Problem Relation Age of Onset   Diabetes type II Mother    Hypertension Mother    Diabetes Mother    Liver cancer Father    Diabetes type II Sister    Breast cancer Sister    Diabetes Sister    Hypertension Sister    Diabetes type II Brother    Kidney failure Brother    Diabetes Brother    Hypertension  Brother    Diabetes type II Sister    - negative except otherwise stated in the family history section Allergies  Allergen Reactions   Cytoxan [Cyclophosphamide] Other (See Comments)    Pancytopenia   Pletal [Cilostazol] Palpitations and Other (See Comments)    "Can hear heart beating loudly".   Keflex [Cephalexin] Nausea And Vomiting   Tylenol [Acetaminophen] Other (See Comments)    Patient has a cold sweat   Prior to Admission medications   Medication Sig Start Date End Date Taking? Authorizing Provider  amoxicillin (AMOXIL) 500 MG capsule Take 1 capsule (500 mg total) by mouth at bedtime. 11/22/21 11/17/22 Yes Vu, Tonita Phoenix, MD  apixaban (ELIQUIS) 5 MG TABS tablet Take 1 tablet (5 mg total) by mouth 2 (two) times daily. 12/11/21 03/11/22 Yes Cantwell, Celeste C, PA-C  atorvastatin (LIPITOR) 20 MG tablet Take 1 tablet (20 mg total) by mouth at bedtime. 10/01/21  Yes Glade Lloyd, MD  doxycycline (VIBRA-TABS) 100 MG tablet Take 1 tablet (100 mg total) by mouth 2 (two) times daily. 11/22/21 11/17/22 Yes Vu, Tonita Phoenix, MD  furosemide (LASIX) 80 MG tablet Take 80 mg by mouth See admin instructions. Take 80 mg by mouth on (non-dialysis days) Mondays, Wednesdays, Fridays & Sundays in the morning as needed "to cause urination" 02/26/21  Yes [provider]  ibuprofen (ADVIL) 800 MG tablet Take 800 mg by mouth every 8 (eight) hours as needed for moderate pain.   Yes [provider]  lidocaine-prilocaine (EMLA) cream Apply 1 application topically every dialysis (on Tues/Thurs/Sat).   Yes [provider]  metoprolol tartrate (LOPRESSOR) 25 MG tablet Take 1 tablet (25 mg total) by mouth 2 (two) times daily. 05/24/21 05/19/22 Yes Yates Decamp, MD  Multiple Vitamin (MULTIVITAMIN WITH MINERALS) TABS tablet Take 1 tablet by mouth 2 (two) times a week.   Yes [provider]  NOVOLIN 70/30 (70-30) 100 UNIT/ML injection Inject 16 Units into the skin 2 (two) times daily with a  meal. Patient taking differently: Inject 10-20 Units into the skin 2 (two) times daily as needed (high blood sugar). 11/06/21  Yes Tyrone Nine, MD  oxyCODONE-acetaminophen (PERCOCET/ROXICET) 5-325 MG tablet Take 1 tablet by mouth every 8 (eight) hours as needed for moderate pain or severe pain.  01/11/22  Yes Nadara Mustard, MD  pantoprazole (PROTONIX) 40 MG tablet Take 40 mg by mouth daily.   Yes [provider]  ACCU-CHEK FASTCLIX LANCETS MISC Check blood sugar TID & QHS 10/01/14   Advani, Ayesha Rumpf, MD  blood glucose meter kit and supplies KIT Check blood sugar TID & QHS 02/10/15   Advani, Ayesha Rumpf, MD  Blood Glucose Monitoring Suppl (ACCU-CHEK ADVANTAGE DIABETES) kit Use as instructed 10/01/14   Doris Cheadle, MD  glucose blood (ACCU-CHEK AVIVA PLUS) test strip Use as instructed 10/01/14   Advani, Ayesha Rumpf, MD  glucose blood (CHOICE DM FORA G20 TEST STRIPS) test strip Check blood sugar TID & QHS 02/10/15   Advani, Ayesha Rumpf, MD  glucose blood test strip Use as instructed 11/20/13   Richarda Overlie, MD  methylPREDNISolone (MEDROL DOSEPAK) 4 MG TBPK tablet See admin instructions. 12/20/21   [provider]   No results found. - pertinent xrays, CT, MRI studies were reviewed and independently interpreted  Positive ROS: All other systems have been reviewed and were otherwise negative with the exception of those mentioned in the HPI and as above.  Physical Exam: General: Alert, no acute distress Psychiatric: Patient is competent for consent with normal mood and affect Lymphatic: No axillary or cervical lymphadenopathy Cardiovascular: No pedal edema Respiratory: No cyanosis, no use of accessory musculature GI: No organomegaly, abdomen is soft and non-tender    Images:  @ENCIMAGES @  Labs:  Lab Results  Component Value Date   HGBA1C 6.8 (H) 10/29/2021   HGBA1C 5.5 05/23/2020   HGBA1C 8.6 (H) 01/27/2020   ESRSEDRATE 25 (H) 01/17/2022   ESRSEDRATE 25 (H) 01/17/2022   ESRSEDRATE 30  (H) 01/16/2022   CRP 14.9 (H) 01/16/2022   CRP 14.0 (H) 01/15/2022   REPTSTATUS 01/24/2022 FINAL 01/19/2022   GRAMSTAIN  01/19/2022    RARE WBC PRESENT,BOTH PMN AND MONONUCLEAR NO ORGANISMS SEEN    CULT  01/19/2022    No growth aerobically or anaerobically. Performed at [facility], [address] [place], [place] [address]    [facility] STREPTOCOCCUS MITIS/ORALIS 01/26/2020    Lab Results  Component Value Date   ALBUMIN 2.2 (L) 01/25/2022   ALBUMIN 1.9 (L) 01/20/2022   ALBUMIN 2.4 (L) 01/13/2022     CBC EXTENDED Latest Ref Rng & Units 01/25/2022 01/24/2022 01/23/2022  WBC 4.0 - 10.5 K/uL 9.7 8.5 10.7(H)  RBC 4.22 - 5.81 MIL/uL 3.19(L) 3.22(L) 3.42(L)  HGB 13.0 - 17.0 g/dL 6.0(A) 5.4(U) 10.2(L)  HCT 39.0 - 52.0 % 31.3(L) 32.0(L) 33.1(L)  PLT 150 - 400 K/uL 157 133(L) 128(L)  NEUTROABS 1.7 - 7.7 K/uL - 5.6 7.9(H)  LYMPHSABS 0.7 - 4.0 K/uL - 1.9 2.0    Neurologic: Patient does not have protective sensation bilateral lower extremities.   MUSCULOSKELETAL:   Skin: Examination the dressings were removed patient has increased ischemic changes of the right transtibial amputation and has ischemic ulcers with small purulent abscesses in the left leg.  Patient has a painful straight leg raise bilaterally.  Patient's recent albumin is 2.2 white cell count 9.7.  Hemoglobin 9.6.  Hemoglobin A1c 6.8.  Assessment: Assessment: Progressive ischemic changes to the right transtibial amputation` and progressive ischemic changes to the left leg with lack of motor function in the left leg secondary to his lumbar disease.  Plan: Plan: Patient states he is not a candidate for lumbar spine surgery.  Discussed that his best option would be to proceed with bilateral above-the-knee amputations for his lower extremities.  I  will discuss this with his wife.  Unsure of when patient wants to proceed with surgery.  Thank you for the consult and the opportunity to see Mr. Lyzander Kolm,  MD [facility] [phone] 2:10 PM

## 2022-01-25 NOTE — Progress Notes (Signed)
removed 2543ms net fluid tolerated tx well,  no complaints no complications.  gave calcitril and aranesp as ordered. pre bp 123/62 post bp 109/67 pre weight 89.7kg post weight 87.0kg bed scales.  2 bandages to rua avf no bleeding dressing cdi. ?

## 2022-01-25 NOTE — Progress Notes (Signed)
? Alexander [name]  MWU:[id number] DOB: [birth year] DOA: 01/13/2022 ?PCP: [name], MD   ? ?Brief Narrative:  ?61 year old with a history of ESRD on HD TTS, HTN, HLD, PVD status post right BKA and left midfoot amputation, A-fib on chronic Eliquis, CAD, DM2, and pacemaker placement who presented to the ED with intractable low back pain over several days with lower extremity radiation and radiculopathy. ? ?The patient underwent MRI with aspiration of a disc space 3/10.  Initially there was a plan for the patient to go to surgery for decompression of an area of spinal stenosis, but ultimately it was felt that his risk for surgery was too high and the decision was made to cancel surgery. ? ?Consultants:  ?Cardiology -Dr. [name] ?Neurosurgery ?Nephrology ?IR ? ?Code Status: FULL CODE ? ?DVT prophylaxis: ?Eliquis  ? ?Interim Hx: ?No acute events reported overnight.  Afebrile.  Vital signs stable.  Neurontin added to regimen yesterday, and adjustments made in narcotic pain medications. Pt seen in HD unit. He reports no signif improvement in pain overall, but does feel that oral dialudid worked better than oxycodone. No sob. No N/V.  ? ?Assessment & Plan: ? ?Lumbar back pain - L2-3 severe spinal stenosis - radiculopathy of left lower extremity ?CT noted moderate neuroforaminal stenosis at L2-3 and L5-S1 -MRI lumbar spine noted subacute fractures L2-L3 disc and severe multifactorial spinal stenosis at L2-3 -followed by Dr. Saintclair Halsted with neurosurgery -surgical intervention canceled due to high risk -IR completed disc aspiration 3/10 with no evidence of infection -has completed the course of IV Decadron -goal at this time is pain management -PT/OT recommend SNF but patient refusing -adjust Neurontin for neuropathic pain -adjust narcotic again for low back pain as able ? ?ESRD on HD TTS ?Nephrology attending to dialysis ? ?Paroxysmal atrial fibrillation ?Continue Lopressor and Eliquis ? ?HTN ?Blood pressure well  controlled ? ?Hyperlipidemia ?Continue statin ? ?DM 2 ?CBG well controlled ? ?Chronic pacemaker infection - Severe tricuspid regurgitation with flail tricuspid valve leaflet - possible vegetation anterior mitral leaflet ?Followed by ID in outpatient setting - continue suppressive therapy with doxycycline and amoxicillin indefinitely ? ?Pressure Injury 01/14/22 Ischial tuberosity Right Stage 3 -  Full thickness tissue loss. Subcutaneous fat may be visible but bone, tendon or muscle are NOT exposed. (Active)  ?01/14/22 0321  ?Location: Ischial tuberosity  ?Location Orientation: Right  ?Staging: Stage 3 -  Full thickness tissue loss. Subcutaneous fat may be visible but bone, tendon or muscle are NOT exposed.  ?Wound Description (Comments):   ?Present on Admission: Yes  ?   ?Pressure Injury 01/14/22 Buttocks Right Stage 2 -  Partial thickness loss of dermis presenting as a shallow open injury with a red, pink wound bed without slough. (Active)  ?01/14/22 0322  ?Location: Buttocks  ?Location Orientation: Right  ?Staging: Stage 2 -  Partial thickness loss of dermis presenting as a shallow open injury with a red, pink wound bed without slough.  ?Wound Description (Comments):   ?Present on Admission: Yes  ?   ?Pressure Injury 01/14/22 Buttocks Posterior;Proximal;Right Stage 2 -  Partial thickness loss of dermis presenting as a shallow open injury with a red, pink wound bed without slough. (Active)  ?01/14/22 0324  ?Location: Buttocks  ?Location Orientation: Posterior;Proximal;Right  ?Staging: Stage 2 -  Partial thickness loss of dermis presenting as a shallow open injury with a red, pink wound bed without slough.  ?Wound Description (Comments):   ?Present on Admission: Yes  ? ? ?Family Communication: no  family present at time of exam today  ?Disposition: SNF recommended but patient refusing -also refusing HH - plan for dc home once pain control improved  ? ?Objective: ?Blood pressure (!) 109/53, pulse 98, temperature 97.8  ?F (36.6 ?C), resp. rate 16, height 6' (1.829 m), weight 89.7 kg, SpO2 100 %. ? ?Intake/Output Summary (Last 24 hours) at 01/25/2022 0821 ?Last data filed at 01/25/2022 0600 ?Gross per 24 hour  ?Intake 1360 ml  ?Output 0 ml  ?Net 1360 ml  ? ? ?Filed Weights  ? 01/23/22 1421 01/23/22 1905 01/25/22 0753  ?Weight: 91.6 kg 88.5 kg 89.7 kg  ? ? ?Examination: ?General: No acute respiratory distress ?Lungs: Clear to auscultation bilaterally  ?Cardiovascular: RRR - no M or rub  ?Abdomen: Nontender, nondistended, soft, bowel sounds positive, no rebound, no ascites, no appreciable mass ?Extremities: trace B LE edema  ? ?CBC: ?Recent Labs  ?Lab 01/22/22 ?5993 01/23/22 ?0357 01/24/22 ?5701  ?WBC 9.5 10.7* 8.5  ?NEUTROABS 6.1 7.9* 5.6  ?HGB 9.5* 10.2* 9.7*  ?HCT 30.4* 33.1* 32.0*  ?MCV 98.4 96.8 99.4  ?PLT 120* 128* 133*  ? ? ?Basic Metabolic Panel: ?Recent Labs  ?Lab 01/19/22 ?1227 01/20/22 ?0203 01/22/22 ?7793  ?NA 134* 135 134*  ?K 4.3 4.2 3.2*  ?CL 95* 98 98  ?CO2 '22 28 26  '$ ?GLUCOSE 168* 101* 112*  ?BUN 27* 28* 34*  ?CREATININE 2.82* 3.15* 3.23*  ?CALCIUM 8.2* 8.2* 8.0*  ?PHOS  --  4.1  --   ? ? ?GFR: ?Estimated Creatinine Clearance: 25.7 mL/min (A) (by C-G formula based on SCr of 3.23 mg/dL (H)). ? ?Liver Function Tests: ?Recent Labs  ?Lab 01/20/22 ?0203  ?ALBUMIN 1.9*  ? ? ? ?HbA1C: ?Hgb A1c MFr Bld  ?Date/Time Value Ref Range Status  ?10/29/2021 08:52 AM 6.8 (H) 4.8 - 5.6 % Final  ?  Comment:  ?  (NOTE) ?Pre diabetes:          5.7%-6.4% ? ?Diabetes:              >6.4% ? ?Glycemic control for   <7.0% ?adults with diabetes ?  ?05/23/2020 07:55 PM 5.5 4.8 - 5.6 % Final  ?  Comment:  ?  (NOTE) ?Pre diabetes:          5.7%-6.4% ? ?Diabetes:              >6.4% ? ?Glycemic control for   <7.0% ?adults with diabetes ?  ? ? ?CBG: ?Recent Labs  ?Lab 01/23/22 ?2040 01/24/22 ?0805 01/24/22 ?1142 01/24/22 ?1650 01/24/22 ?2222  ?GLUCAP 73 135* 133* 132* 92  ? ? ? ?Scheduled Meds: ? (feeding supplement) PROSource Plus  30 mL Oral BID BM  ?  amoxicillin  500 mg Oral Q24H  ? apixaban  5 mg Oral BID  ? atorvastatin  20 mg Oral QHS  ? calcitRIOL  0.25 mcg Oral Daily  ? Chlorhexidine Gluconate Cloth  6 each Topical Daily  ? darbepoetin (ARANESP) injection - DIALYSIS  60 mcg Intravenous Q Thu-HD  ? docusate sodium  100 mg Oral BID  ? doxycycline  100 mg Oral Q12H  ? furosemide  80 mg Oral Once per day on Sun Mon Wed Fri  ? gabapentin  100 mg Oral TID  ? insulin aspart  0-6 Units Subcutaneous TID WC  ? insulin aspart protamine- aspart  8 Units Subcutaneous BID WC  ? lidocaine  1 patch Transdermal Q24H  ? metoprolol tartrate  25 mg Oral BID  ? multivitamin with  minerals  1 tablet Oral Once per day on Mon Thu  ? pantoprazole  40 mg Oral Daily  ? ? ? LOS: 12 days  ? ?Cherene Altes, MD ?[facility] ?Office  [phone] ?Pager - Text Page per Shea Evans ? ?If 7PM-7AM, please contact night-coverage per Amion ?01/25/2022, 8:21 AM ? ? ? ?

## 2022-01-25 NOTE — Progress Notes (Signed)
Dialysis Orders: ?TTS - GOC ?4hrs66mn, BFR 400, DFR 800,  EDW 94kg (will need to decr weight upon d/c), 3K/ 2.5Ca ?No Systemic Heparin ?Mircera 200 mcg IV q2wks - last 01/11/22 ?Calcitriol 0.251m PO qHD - last 01/13/22 ?  ?Problem/Plan ?1 Lumbar back pain/Radiculopathy: Followed by Spine Surgery Dr. CrSaintclair HalstedMR Lumbar Spine completed: showed fractures L2-L3 with fluid-? Trauma vs. Infection. Neurosurgeon canceled surgery secondary to high risk ? L2-3 disc aspiration 01/19/2022 culture negative.Continue pain management and Pt/OT.  ? ?Eventually as outpt either will need PMD or pain clinic to manage. I d/w the pt and he knows nephrology will not write opioids at the outpatient centers. ? ?2 ESRD - on HD TTS. HD 01/23/2022 w/ net 2.7L.  ? ?Seen on HD  ?3K bath through the RUA access ?117/75 2.5L net UF as tolerated ? ?Next HD Sat. ? ?3  Hypertension/volume  -BP stable this a.m. 100/63,currently not volume overloaded. BP lower but asymp. Very much under OP EDW if wt is accurate. Lower EDW on discharge. UF as tolerated.  ?4. Anemia of CKD - Hgb 10.2. ESA due 01/25/2022.  60 mcg Aranesp due on Thur ?5. Secondary Hyperparathyroidism - PO4 at goal (4.1 on 3/11). Continue VDRA. ?6. Nutrition - Renal diet on fluid restriction. Albumin very low. Start protein supps, renal vitamins  ?7. DMT2-per primary ?8. 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 Follows with cards. ? ?Subjective: Seen in dialysis, did not sleep well bec of spasms in the lower back pain. He jerked suddenly when I woke him up but I confirmed that there was no damage to the dialysis access with the needles in. ? ?Objective ?Vital signs in last 24 hours: ?Vitals:  ? 01/25/22 0753 01/25/22 0809 01/25/22 0830 01/25/22 0900  ?BP: 123/62 (!) 109/53 100/64 108/62  ?Pulse: 93 98 94 (!) 102  ?Resp: 16     ?Temp: 97.8 ?F (36.6 ?C)     ?TempSrc:      ?SpO2:      ?Weight: 89.7 kg     ?Height:      ? ?Weight change:  ? ?Physical Exam: ?General: Alert, chronically  ill-appearing male NAD  ?Heart: RRR no MRG ?Lungs: CTA nonlabored breathing ?Abdomen: Obese NABS, soft, NT ?Extremities: Right BKA slight thigh edema, left Unna boot has some left toe amps ? Dialysis Access: Right arm AV fistula positive bruit w/ needles ? ? ? ?01/25/2022,9:32 AM ? LOS: 12 days  ? ?Labs: ?Basic Metabolic Panel: ?Recent Labs  ?Lab 01/20/22 ?0203 01/22/22 ?0418293/16/23 ?089371?NA 135 134* 136  ?K 4.2 3.2* 3.7  ?CL 98 98 98  ?CO2 '28 26 25  '$ ?GLUCOSE 101* 112* 116*  ?BUN 28* 34* 27*  ?CREATININE 3.15* 3.23* 2.81*  ?CALCIUM 8.2* 8.0* 8.3*  ?PHOS 4.1  --  4.6  ? ?Liver Function Tests: ?Recent Labs  ?Lab 01/20/22 ?0203 01/25/22 ?0816  ?ALBUMIN 1.9* 2.2*  ? ?No results for input(s): LIPASE, AMYLASE in the last 168 hours. ?No results for input(s): AMMONIA in the last 168 hours. ?CBC: ?Recent Labs  ?Lab 01/21/22 ?0069673/13/23 ?0489383/14/23 ?0357 01/24/22 ?0218 01/25/22 ?0815  ?WBC 10.2 9.5 10.7* 8.5 9.7  ?NEUTROABS 7.8* 6.1 7.9* 5.6  --   ?HGB 10.1* 9.5* 10.2* 9.7* 9.6*  ?HCT 32.7* 30.4* 33.1* 32.0* 31.3*  ?MCV 99.4 98.4 96.8 99.4 98.1  ?PLT 123* 120* 128* 133* 157  ? ?Cardiac Enzymes: ?No results for input(s): CKTOTAL, CKMB, CKMBINDEX, TROPONINI in the last 168 hours. ?CBG: ?Recent Labs  ?  Lab 01/23/22 ?2040 01/24/22 ?0805 01/24/22 ?1142 01/24/22 ?1650 01/24/22 ?2222  ?GLUCAP 73 135* 133* 132* 92  ? ? ?Studies/Results: ?No results found. ?Medications: ? ? (feeding supplement) PROSource Plus  30 mL Oral BID BM  ? amoxicillin  500 mg Oral Q24H  ? apixaban  5 mg Oral BID  ? atorvastatin  20 mg Oral QHS  ? calcitRIOL  0.25 mcg Oral Daily  ? Chlorhexidine Gluconate Cloth  6 each Topical Daily  ? darbepoetin (ARANESP) injection - DIALYSIS  60 mcg Intravenous Q Thu-HD  ? docusate sodium  100 mg Oral BID  ? doxycycline  100 mg Oral Q12H  ? furosemide  80 mg Oral Once per day on Sun Mon Wed Fri  ? gabapentin  100 mg Oral TID  ? insulin aspart  0-6 Units Subcutaneous TID WC  ? insulin aspart protamine- aspart  8 Units  Subcutaneous BID WC  ? lidocaine  1 patch Transdermal Q24H  ? metoprolol tartrate  25 mg Oral BID  ? multivitamin with minerals  1 tablet Oral Once per day on Mon Thu  ? pantoprazole  40 mg Oral Daily  ? ? ? ? ?

## 2022-01-26 ENCOUNTER — Encounter (HOSPITAL_COMMUNITY)
Admission: EM | Disposition: A | Discharge status: Skilled Nursing Facility | Payer: Self-pay | Source: Home / Self Care | Attending: Internal Medicine

## 2022-01-26 DIAGNOSIS — M5416 Radiculopathy, lumbar region: Secondary | ICD-10-CM | POA: Diagnosis not present

## 2022-01-26 HISTORY — PX: ABDOMINAL AORTOGRAM W/LOWER EXTREMITY: CATH118223

## 2022-01-26 LAB — CBC
HCT: 31.8 % — ABNORMAL LOW (ref 39.0–52.0)
Hemoglobin: 9.4 g/dL — ABNORMAL LOW (ref 13.0–17.0)
MCH: 29.7 pg (ref 26.0–34.0)
MCHC: 29.6 g/dL — ABNORMAL LOW (ref 30.0–36.0)
MCV: 100.6 fL — ABNORMAL HIGH (ref 80.0–100.0)
Platelets: 145 10*3/uL — ABNORMAL LOW (ref 150–400)
RBC: 3.16 MIL/uL — ABNORMAL LOW (ref 4.22–5.81)
RDW: 17.6 % — ABNORMAL HIGH (ref 11.5–15.5)
WBC: 10.1 10*3/uL (ref 4.0–10.5)
nRBC: 0 % (ref 0.0–0.2)

## 2022-01-26 LAB — BASIC METABOLIC PANEL
Anion gap: 12 (ref 5–15)
BUN: 23 mg/dL (ref 8–23)
CO2: 25 mmol/L (ref 22–32)
Calcium: 8.3 mg/dL — ABNORMAL LOW (ref 8.9–10.3)
Chloride: 99 mmol/L (ref 98–111)
Creatinine, Ser: 2.69 mg/dL — ABNORMAL HIGH (ref 0.61–1.24)
GFR, Estimated: 26 mL/min — ABNORMAL LOW (ref >60)
Glucose, Bld: 149 mg/dL — ABNORMAL HIGH (ref 70–99)
Potassium: 4.2 mmol/L (ref 3.5–5.1)
Sodium: 136 mmol/L (ref 135–145)

## 2022-01-26 LAB — GLUCOSE, CAPILLARY
Glucose-Capillary: 188 mg/dL — ABNORMAL HIGH (ref 70–99)
Glucose-Capillary: 132 mg/dL — ABNORMAL HIGH (ref 70–99)
Glucose-Capillary: 178 mg/dL — ABNORMAL HIGH (ref 70–99)
Glucose-Capillary: 160 mg/dL — ABNORMAL HIGH (ref 70–99)

## 2022-01-26 SURGERY — ABDOMINAL AORTOGRAM W/LOWER EXTREMITY
Anesthesia: LOCAL

## 2022-01-26 MED ORDER — IODIXANOL 320 MG/ML IV SOLN
INTRAVENOUS | Status: DC | PRN
  Administered 2022-01-26: 65 mL via INTRA_ARTERIAL

## 2022-01-26 MED ORDER — HEPARIN SODIUM (PORCINE) 1000 UNIT/ML DIALYSIS
1000.0000 [IU] | INTRAMUSCULAR | Status: DC | PRN
  Filled 2022-01-26: qty 1

## 2022-01-26 MED ORDER — LIDOCAINE HCL (PF) 1 % IJ SOLN: 5.0000 mL | INTRAMUSCULAR | Status: DC | PRN

## 2022-01-26 MED ORDER — PENTAFLUOROPROP-TETRAFLUOROETH EX AERO
1.0000 "application " | INHALATION_SPRAY | CUTANEOUS | Status: DC | PRN
  Administered 2022-01-27: 1 via TOPICAL
  Filled 2022-01-26: qty 103.5

## 2022-01-26 MED ORDER — FENTANYL CITRATE (PF) 100 MCG/2ML IJ SOLN
INTRAMUSCULAR | Status: DC | PRN
  Administered 2022-01-26: 50 ug via INTRAVENOUS

## 2022-01-26 MED ORDER — SODIUM CHLORIDE 0.9 % IV SOLN: 250.0000 mL | INTRAVENOUS | Status: DC | PRN

## 2022-01-26 MED ORDER — SODIUM CHLORIDE 0.9 % IV SOLN: 100.0000 mL | INTRAVENOUS | Status: DC | PRN

## 2022-01-26 MED ORDER — HEPARIN (PORCINE) IN NACL 1000-0.9 UT/500ML-% IV SOLN
INTRAVENOUS | Status: AC
  Filled 2022-01-26: qty 1000

## 2022-01-26 MED ORDER — MIDAZOLAM HCL 2 MG/2ML IJ SOLN
INTRAMUSCULAR | Status: DC | PRN
  Administered 2022-01-26: 2 mg via INTRAVENOUS

## 2022-01-26 MED ORDER — LIDOCAINE HCL (PF) 1 % IJ SOLN
INTRAMUSCULAR | Status: DC | PRN
  Administered 2022-01-26: 15 mL via INTRADERMAL

## 2022-01-26 MED ORDER — METOPROLOL TARTRATE 5 MG/5ML IV SOLN
INTRAVENOUS | Status: DC | PRN
  Administered 2022-01-26: 5 mg via INTRAVENOUS

## 2022-01-26 MED ORDER — LIDOCAINE HCL (PF) 1 % IJ SOLN
INTRAMUSCULAR | Status: AC
  Filled 2022-01-26: qty 30

## 2022-01-26 MED ORDER — CHLORHEXIDINE GLUCONATE CLOTH 2 % EX PADS
6.0000 | MEDICATED_PAD | Freq: Every day | CUTANEOUS | Status: DC
  Administered 2022-01-27 – 2022-02-05 (×6): 6 via TOPICAL

## 2022-01-26 MED ORDER — MIDAZOLAM HCL 2 MG/2ML IJ SOLN
INTRAMUSCULAR | Status: AC
  Filled 2022-01-26: qty 2

## 2022-01-26 MED ORDER — SODIUM CHLORIDE 0.9% FLUSH
3.0000 mL | Freq: Two times a day (BID) | INTRAVENOUS | Status: DC
  Administered 2022-01-26 – 2022-02-14 (×31): 3 mL via INTRAVENOUS

## 2022-01-26 MED ORDER — HEPARIN (PORCINE) IN NACL 1000-0.9 UT/500ML-% IV SOLN
INTRAVENOUS | Status: DC | PRN
  Administered 2022-01-26 (×2): 500 mL

## 2022-01-26 MED ORDER — SODIUM CHLORIDE 0.9% FLUSH: 3.0000 mL | INTRAVENOUS | Status: DC | PRN

## 2022-01-26 MED ORDER — ALTEPLASE 2 MG IJ SOLR: 2.0000 mg | Freq: Once | INTRAMUSCULAR | Status: DC | PRN

## 2022-01-26 MED ORDER — METOPROLOL TARTRATE 5 MG/5ML IV SOLN
INTRAVENOUS | Status: AC
  Filled 2022-01-26: qty 5

## 2022-01-26 MED ORDER — FENTANYL CITRATE (PF) 100 MCG/2ML IJ SOLN
INTRAMUSCULAR | Status: AC
  Filled 2022-01-26: qty 2

## 2022-01-26 SURGICAL SUPPLY — 12 items
CATH ANGIO 5F PIGTAIL 65CM (CATHETERS) ×1 IMPLANT
CLOSURE MYNX CONTROL 5F (Vascular Products) ×1 IMPLANT
GUIDEWIRE ANGLED .035X150CM (WIRE) ×1 IMPLANT
KIT MICROPUNCTURE NIT STIFF (SHEATH) ×1 IMPLANT
KIT PV (KITS) ×2 IMPLANT
SHEATH PINNACLE 5F 10CM (SHEATH) ×1 IMPLANT
STOPCOCK MORSE 400PSI 3WAY (MISCELLANEOUS) ×1 IMPLANT
SYR MEDRAD MARK 7 150ML (SYRINGE) ×2 IMPLANT
TRANSDUCER W/STOPCOCK (MISCELLANEOUS) ×2 IMPLANT
TRAY PV CATH (CUSTOM PROCEDURE TRAY) ×2 IMPLANT
TUBING CIL FLEX 10 FLL-RA (TUBING) ×1 IMPLANT
WIRE HITORQ VERSACORE ST 145CM (WIRE) ×1 IMPLANT

## 2022-01-26 NOTE — Plan of Care (Signed)
  Problem: Clinical Measurements: Goal: Will remain free from infection Outcome: Progressing Goal: Respiratory complications will improve Outcome: Progressing Goal: Cardiovascular complication will be avoided Outcome: Progressing   

## 2022-01-26 NOTE — Progress Notes (Signed)
Physical Therapy Treatment ?Patient Details ?Name: Alexander [name] ?MRN: [id number] ?DOB: 11-02-1961 ?Today's Date: 01/26/2022 ? ? ?History of Present Illness The pt is a 61 yo male presenting 3/4 with chronic back pain and weakness to LLE in addition to frequent falls (x2 on day of admission). PMH: ESRD on HD TTS, paroxysmal atrial fibrillation on Eliquis, pacemaker, CAD s/p stent, diastolic CHF, PVD with right BKA and left midfoot amputation, history of GI bleed, and DM II. ? ?  ?PT Comments  ? ? Patient agreeable to attempt sitting EOB this date. Patient modA for rolling to R side and minA to achieve upright sitting on EOB. Patient with heavy use of B UE to maintain sitting balance. When attempting single UE support, patient with posterior LOB x 2 requiring maxA to recover. Patient declined attempting lateral scooting on EOB due to increasing pain in back. Continue to recommend SNF for ongoing Physical Therapy.    ?  ?Recommendations for follow up therapy are one component of a multi-disciplinary discharge planning process, led by the attending physician.  Recommendations may be updated based on patient status, additional functional criteria and insurance authorization. ? ?Follow Up Recommendations ? Skilled nursing-short term rehab (<3 hours/day) ?  ?  ?Assistance Recommended at Discharge Frequent or constant Supervision/Assistance  ?Patient can return home with the following Two people to help with walking and/or transfers;A lot of help with bathing/dressing/bathroom;Assistance with cooking/housework;Direct supervision/assist for medications management;Assist for transportation;Help with stairs or ramp for entrance ?  ?Equipment Recommendations ? Hospital bed (hoyer lift)  ?  ?Recommendations for Other Services   ? ? ?  ?Precautions / Restrictions Precautions ?Precautions: Fall ?Restrictions ?Weight Bearing Restrictions: No  ?  ? ?Mobility ? Bed Mobility ?Overal bed mobility: Needs Assistance ?Bed Mobility: Rolling,  Sidelying to Sit, Sit to Sidelying ?Rolling: Mod assist ?Sidelying to sit: Min assist ?  ?  ?Sit to sidelying: Min assist ?General bed mobility comments: modA to initiate rolling by flexing L LE and assist with truncal rotation. Patient required minA for trunk elevation to EOB and bringing LEs back onto bed. While sitting EOB with B UE support, patient with x 2 posterior LOB requiring maxA to recover. ?  ? ?Transfers ?  ?  ?  ?  ?  ?  ?  ?  ?  ?General transfer comment: patient declined ?  ? ?Ambulation/Gait ?  ?  ?  ?  ?  ?  ?  ?  ? ? ?Stairs ?  ?  ?  ?  ?  ? ? ?Wheelchair Mobility ?  ? ?Modified Rankin (Stroke Patients Only) ?  ? ? ?  ?Balance Overall balance assessment: Needs assistance ?Sitting-balance support: Bilateral upper extremity supported, Feet unsupported ?Sitting balance-Leahy Scale: Zero ?Sitting balance - Comments: posterior LOB x 2 requiring maxA to recover. MinA to maintain sitting balance ?  ?  ?  ?  ?  ?  ?  ?  ?  ?  ?  ?  ?  ?  ?  ?  ? ?  ?Cognition Arousal/Alertness: Awake/alert ?Behavior During Therapy: [facility] for tasks assessed/performed ?Overall Cognitive Status: Within Functional Limits for tasks assessed ?  ?  ?  ?  ?  ?  ?  ?  ?  ?  ?  ?  ?  ?  ?  ?  ?  ?  ?  ? ?  ?Exercises   ? ?  ?General Comments   ?  ?  ? ?Pertinent  Vitals/Pain Pain Assessment ?Pain Assessment: 0-10 ?Pain Score: 10-Worst pain ever ?Pain Location: back ?Pain Descriptors / Indicators: Hervey Ard, Shooting ?Pain Intervention(s): Monitored during session, Repositioned  ? ? ?Home Living   ?  ?  ?  ?  ?  ?  ?  ?  ?  ?   ?  ?Prior Function    ?  ?  ?   ? ?PT Goals (current goals can now be found in the care plan section) Acute Rehab PT Goals ?Patient Stated Goal: Reduce pain to be able to go home. ?PT Goal Formulation: With patient ?Time For Goal Achievement: 01/28/22 ?Potential to Achieve Goals: Fair ?Progress towards PT goals: Progressing toward goals ? ?  ?Frequency ? ? ? Min 3X/week ? ? ? ?  ?PT Plan Current plan remains  appropriate  ? ? ?Co-evaluation   ?  ?  ?  ?  ? ?  ?AM-PAC PT "6 Clicks" Mobility   ?Outcome Measure ? Help needed turning from your back to your side while in a flat bed without using bedrails?: A Lot ?Help needed moving from lying on your back to sitting on the side of a flat bed without using bedrails?: A Little ?Help needed moving to and from a bed to a chair (including a wheelchair)?: Total ?Help needed standing up from a chair using your arms (e.g., wheelchair or bedside chair)?: Total ?Help needed to walk in hospital room?: Total ?Help needed climbing 3-5 steps with a railing? : Total ?6 Click Score: 9 ? ?  ?End of Session   ?Activity Tolerance: Patient limited by pain ?Patient left: in bed;with call bell/phone within reach;with bed alarm set ?Nurse Communication: Mobility status ?PT Visit Diagnosis: Pain;Unsteadiness on feet (R26.81);Repeated falls (R29.6) ?  ? ? ?Time: 8676-1950 ?PT Time Calculation (min) (ACUTE ONLY): 27 min ? ?Charges:  $Therapeutic Activity: 23-37 mins          ?          ? ?Lakeisha Waldrop A. Gilford Rile, PT, DPT ?Acute Rehabilitation Services ?Pager [phone] ?Office [phone] ? ? ? ?Alantra Popoca A Fernado Brigante ?01/26/2022, 1:51 PM ? ?

## 2022-01-26 NOTE — Progress Notes (Signed)
Patient has sliding board in his room. Patients' clothes were taken home by his daughter per patient ? ?Patient has eaten and is resting comfortably. ?

## 2022-01-26 NOTE — Progress Notes (Signed)
Patient having multiple bouts of non-sustained vtach. Dr. [name] aware. ?

## 2022-01-26 NOTE — Consult Note (Signed)
CARDIOLOGY CONSULT NOTE  ?Patient ID: ?Alexander [name] ?MRN: [id number] ?DOB/AGE: June 18, 1961 61 y.o. ? ?Admit date: 01/13/2022 ?Referring Physician  Meridee Score MD ?Primary Physician:  [name], MD ?Reason for Consultation  PAD and need for amputation ? ?Patient ID: Alexander [name], male    DOB: Dec 04, 1960, 61 y.o.   MRN: [id number] ? ?Chief Complaint  ?Patient presents with  ? Back Pain  ? Weakness  ?Non-healing leg gangrene stump ? ? ?HPI:   ? ?Alexander [name]  is a 61 y.o. Caucasian male patient with paroxysmal atrial fibrillation, sinus node dysfunction and complete heart block SP Medtronic pacemaker implantation on 10/07/2014, PAF, coronary artery disease with angioplasty in 2015,  ischemic and nonischemic cardiomyopathy with severe LV systolic dysfunction, PAD SP right BKA, left transtibial amputation, insulin-dependent diabetes with end-stage renal disease on hemodialysis, obesity, chronic back pain and a former tobacco smoker presented to the emergency room due to worsening dyspnea, severe back pain.   ? ?Admitted to the hospital with severe back pain, repeat echocardiogram revealing presence of vegetation on the pacemaker lead that was noted previously last month with echo, also there was no pitting plated highly mobile mass on the anterior mitral leaflet again suggestive of vegetation.  He was initially scheduled for back stabilization with rods however as he is high risk the surgery was canceled and medical management was recommended by neurosurgery as well as myself. ? ?As requested by [name] to evaluate him for left above-knee amputation.  Patient has chronic left leg ulcerations that are slowly healing or not healing.  He has eschar noted on bilateral below-knee stumps, right leg has dry gangrene and it was recommended to continue medical therapy for that. ? ?Patient is presently doing well, has not had any acute decompensated heart failure, no chest pain or palpitations. ? ?Past Medical History:   ?Diagnosis Date  ? Acute blood loss anemia   ? Alcohol abuse   ? Anemia   ? Anemia of chronic disease   ? Arthritis   ? "patient does not think so."  ? Atrial fibrillation ([place])   ? Cardiac syncope 10/07/2014  ? rapid A fib with 8 sec pauses on converison with syncope- temp pacing wire placed then PPM  ? Cataract   ? BILATERAL  ? Coronary artery disease   ? Diabetes mellitus   ? dx---been  awhile  ? Elevated LFTs   ? Endocarditis of tricuspid valve   ? ESRD (end stage renal disease) ([place])   ? [place]  ? GERD (gastroesophageal reflux disease)   ? History of blood transfusion   ? Hyperlipemia   ? Hypertension   ? Malnutrition ([place])   ? Osteomyelitis ([place])   ? right foot  ? Peripheral vascular disease ([place])   ? Presence of permanent cardiac pacemaker 10/08/2014  ? Medtronic  ? Tricuspid valve vegetation   ? ?Surgical history reviewed. ? ?Social History  ? ?Tobacco Use  ? Smoking status: Former  ?  Packs/day: 1.50  ?  Years: 30.00  ?  Pack years: 45.00  ?  Types: Cigarettes  ?  Quit date: 10/07/2014  ?  Years since quitting: 7.3  ? Smokeless tobacco: Never  ? Tobacco comments:  ?  Does an occasional cigar  ?Substance Use Topics  ? Alcohol use: Not Currently  ?  Alcohol/week: 21.0 standard drinks  ?  Types: 21 Glasses of wine per week  ?  Comment: cut way back, none in  the last month  ?  ?Family History  ?Problem Relation Age of Onset  ? Diabetes type II Mother   ? Hypertension Mother   ? Diabetes Mother   ? Liver cancer Father   ? Diabetes type II Sister   ? Breast cancer Sister   ? Diabetes Sister   ? Hypertension Sister   ? Diabetes type II Brother   ? Kidney failure Brother   ? Diabetes Brother   ? Hypertension Brother   ? Diabetes type II Sister   ?  ?Marital Status: Married  ?ROS  ?Review of Systems  ?Constitutional: Positive for malaise/fatigue.  ?Cardiovascular:  Negative for chest pain, dyspnea on exertion, leg swelling and orthopnea.  ?Respiratory:  Positive for cough. Negative for  hemoptysis and sputum production.   ?Skin:  Positive for color change (bilateral leg stumps and left leg ulcerations).  ?Musculoskeletal:  Positive for arthritis, back pain and joint pain.  ?Gastrointestinal:  Negative for melena.  ?All other systems reviewed and are negative. ?Objective  ? ?Vitals with BMI 01/26/2022 01/26/2022 01/25/2022  ?Height - - -  ?Weight - 194 lbs 7 oz -  ?BMI - 26.37 -  ?Systolic 99 - 696  ?Diastolic 68 - 67  ?Pulse 85 - 68  ?  ?Blood pressure 99/68, pulse 85, temperature 97.8 ?F (36.6 ?C), temperature source Oral, resp. rate 17, height 6' (1.829 m), weight 88.2 kg, SpO2 92 %.  ?  ?Physical Exam ?Constitutional:   ?   Appearance: He is obese.  ?HENT:  ?   Mouth/Throat:  ?   Mouth: Mucous membranes are moist.  ?Eyes:  ?   Extraocular Movements: Extraocular movements intact.  ?Neck:  ?   Vascular: JVD present. No carotid bruit.  ?Cardiovascular:  ?   Rate and Rhythm: Normal rate. Rhythm irregular.  ?   Pulses: Intact distal pulses.     ?     Femoral pulses are 2+ on the right side and 2+ on the left side. ?      Right popliteal pulse not accessible.  ?     Dorsalis pedis pulses are 2+ on the left side. Right dorsalis pedis pulse not accessible.  ?     Posterior tibial pulses are 0 on the left side. Right posterior tibial pulse not accessible.  ?   Heart sounds: Normal heart sounds. No murmur heard. ?  No gallop.  ?Pulmonary:  ?   Effort: Pulmonary effort is normal.  ?   Breath sounds: Normal breath sounds.  ?Abdominal:  ?   General: Bowel sounds are normal.  ?   Palpations: Abdomen is soft.  ?Musculoskeletal:     ?   General: Deformity (right BKA, cry gangrene at the foot stump noted. Left leg stump covered in surgical dressing and noted by [name] to have sero-purulent discharge) present.  ?   Left lower leg: Edema (1-2+) present.  ?Neurological:  ?   General: No focal deficit present.  ?   Mental Status: He is alert and oriented to person, place, and time.  ? ?Laboratory examination:   ? ?Recent Labs  ?  01/20/22 ?0203 01/22/22 ?7893 01/25/22 ?8101  ?NA 135 134* 136  ?K 4.2 3.2* 3.7  ?CL 98 98 98  ?CO2 '28 26 25  '$ ?GLUCOSE 101* 112* 116*  ?BUN 28* 34* 27*  ?CREATININE 3.15* 3.23* 2.81*  ?CALCIUM 8.2* 8.0* 8.3*  ?GFRNONAA 21* 21* 24*  ? ? ?estimated creatinine clearance is 29.5 mL/min (A) (by C-G  formula based on SCr of 2.81 mg/dL (H)).  ?CMP Latest Ref Rng & Units 01/25/2022 01/22/2022 01/20/2022  ?Glucose 70 - 99 mg/dL 116(H) 112(H) 101(H)  ?BUN 8 - 23 mg/dL 27(H) 34(H) 28(H)  ?Creatinine 0.61 - 1.24 mg/dL 2.81(H) 3.23(H) 3.15(H)  ?Sodium 135 - 145 mmol/L 136 134(L) 135  ?Potassium 3.5 - 5.1 mmol/L 3.7 3.2(L) 4.2  ?Chloride 98 - 111 mmol/L 98 98 98  ?CO2 22 - 32 mmol/L '25 26 28  '$ ?Calcium 8.9 - 10.3 mg/dL 8.3(L) 8.0(L) 8.2(L)  ?Total Protein 6.5 - 8.1 g/dL - - -  ?Total Bilirubin 0.3 - 1.2 mg/dL - - -  ?Alkaline Phos 38 - 126 U/L - - -  ?AST 15 - 41 U/L - - -  ?ALT 0 - 44 U/L - - -  ? ?CBC Latest Ref Rng & Units 01/25/2022 01/24/2022 01/23/2022  ?WBC 4.0 - 10.5 K/uL 9.7 8.5 10.7(H)  ?Hemoglobin 13.0 - 17.0 g/dL 9.6(L) 9.7(L) 10.2(L)  ?Hematocrit 39.0 - 52.0 % 31.3(L) 32.0(L) 33.1(L)  ?Platelets 150 - 400 K/uL 157 133(L) 128(L)  ? ?Lipid Panel ?No results for input(s): CHOL, TRIG, LDLCALC, VLDL, HDL, CHOLHDL, LDLDIRECT in the last 8760 hours.  ?HEMOGLOBIN A1C ?Lab Results  ?Component Value Date  ? HGBA1C 6.8 (H) 10/29/2021  ? MPG 148.46 10/29/2021  ? ?TSH ?No results for input(s): TSH in the last 8760 hours. ?BNP (last 3 results) ?Recent Labs  ?  10/25/21 ?1830  ?BNP 1,181.3*  ? ? ?Cardiac Panel (last 3 results) ?No results for input(s): CKTOTAL, CKMB, TROPONINIHS, RELINDX in the last 72 hours. ?  ? ?Medications and allergies  ? ?Allergies  ?Allergen Reactions  ? Cytoxan [Cyclophosphamide] Other (See Comments)  ?  Pancytopenia  ? Pletal [Cilostazol] Palpitations and Other (See Comments)  ?  "Can hear heart beating loudly".  ? Keflex [Cephalexin] Nausea And Vomiting  ? Tylenol [Acetaminophen] Other (See  Comments)  ?  Patient has a cold sweat  ?  ? ?Current Meds  ?Medication Sig  ? amoxicillin (AMOXIL) 500 MG capsule Take 1 capsule (500 mg total) by mouth at bedtime.  ? apixaban (ELIQUIS) 5 MG TABS tablet Take 1 tablet (5 m

## 2022-01-26 NOTE — Progress Notes (Signed)
? Alexander [name]  NID:[id number] DOB: 1961-10-16 DOA: 01/13/2022 ?PCP: [name], MD   ? ?Brief Narrative:  ?61 year old with a history of ESRD on HD TTS, HTN, HLD, PVD status post right BKA and left midfoot amputation, A-fib on chronic Eliquis, CAD, DM2, and pacemaker placement who presented to the ED with intractable low back pain over several days with lower extremity radiation and radiculopathy. ? ?The patient underwent MRI with aspiration of a disc space 3/10.  Initially there was a plan for the patient to go to surgery for decompression of an area of spinal stenosis, but ultimately it was felt that his risk for surgery was too high and the decision was made to cancel surgery. ? ?Consultants:  ?Cardiology -Dr. [name] ?Neurosurgery ?Nephrology ?IR ?[name] ? ?Code Status: FULL CODE ? ?DVT prophylaxis: ?Eliquis  ? ?Interim Hx: ?Resting comfortably at the time of my visit. Given circumstances of his admission I chose not to wake him. He did not awaken as I ausculted his heart and lungs.  ? ?Assessment & Plan: ? ?Lumbar back pain - L2-3 severe spinal stenosis - radiculopathy of left lower extremity ?CT noted moderate neuroforaminal stenosis at L2-3 and L5-S1 -MRI lumbar spine noted subacute fractures L2-L3 disc and severe multifactorial spinal stenosis at L2-3 -followed by Dr. Saintclair Halsted with neurosurgery -surgical intervention canceled due to high risk -IR completed disc aspiration 3/10 with no evidence of infection -has completed a course of IV Decadron -goal at this time is pain management -PT/OT recommend SNF but patient refusing -adjusted Neurontin for neuropathic pain 3/16 -adjusted narcotic again for low back pain 3/16 ? ?PVD with poorly healing wounds ?Followed in office by [name] - last seen as outpt 01/10/22 for weeping from B LE wounds - placed in compression wrapping - weeping persists here in hospital - [name] saw initially 01/16/22 w/ plan to continue trial w/ compression wrapping - has seen in f/u  again 3/16 and is now suggesting B LE AKA  ? ?ESRD on HD TTS ?Nephrology attending to dialysis ? ?Paroxysmal atrial fibrillation ?Continue Lopressor and Eliquis -heart rate controlled ? ?HTN ?Blood pressure well controlled ? ?Hyperlipidemia ?Continue statin ? ?DM 2 ?CBG well controlled ? ?Chronic pacemaker infection - Severe tricuspid regurgitation with flail tricuspid valve leaflet - possible vegetation anterior mitral leaflet ?Followed by ID in outpatient setting - continue suppressive therapy with doxycycline and amoxicillin indefinitely ? ?Pressure Injury 01/14/22 Ischial tuberosity Right Stage 3 -  Full thickness tissue loss. Subcutaneous fat may be visible but bone, tendon or muscle are NOT exposed. (Active)  ?01/14/22 0321  ?Location: Ischial tuberosity  ?Location Orientation: Right  ?Staging: Stage 3 -  Full thickness tissue loss. Subcutaneous fat may be visible but bone, tendon or muscle are NOT exposed.  ?Wound Description (Comments):   ?Present on Admission: Yes  ?   ?Pressure Injury 01/14/22 Buttocks Right Stage 2 -  Partial thickness loss of dermis presenting as a shallow open injury with a red, pink wound bed without slough. (Active)  ?01/14/22 0322  ?Location: Buttocks  ?Location Orientation: Right  ?Staging: Stage 2 -  Partial thickness loss of dermis presenting as a shallow open injury with a red, pink wound bed without slough.  ?Wound Description (Comments):   ?Present on Admission: Yes  ?   ?Pressure Injury 01/14/22 Buttocks Posterior;Proximal;Right Stage 2 -  Partial thickness loss of dermis presenting as a shallow open injury with a red, pink wound bed without slough. (Active)  ?01/14/22 0324  ?  Location: Buttocks  ?Location Orientation: Posterior;Proximal;Right  ?Staging: Stage 2 -  Partial thickness loss of dermis presenting as a shallow open injury with a red, pink wound bed without slough.  ?Wound Description (Comments):   ?Present on Admission: Yes  ? ? ?Family Communication: no family  present at time of exam today  ?Disposition: SNF recommended but patient refusing -also refusing HH -now awaiting planned bilateral lower extremity AKA ? ?Objective: ?Blood pressure 99/68, pulse 85, temperature 97.8 ?F (36.6 ?C), temperature source Oral, resp. rate 17, height 6' (1.829 m), weight 88.2 kg, SpO2 92 %. ? ?Intake/Output Summary (Last 24 hours) at 01/26/2022 0840 ?Last data filed at 01/26/2022 8502 ?Gross per 24 hour  ?Intake 780 ml  ?Output 2510 ml  ?Net -1730 ml  ? ? ?Filed Weights  ? 01/25/22 0753 01/25/22 1133 01/26/22 0459  ?Weight: 89.7 kg 85.8 kg 88.2 kg  ? ? ?Examination: ?General: No acute respiratory distress ?Lungs: Clear to auscultation bilaterally  ?Cardiovascular: Regular rate without murmur ?Abdomen: nondistended, soft ?Extremities: Trace edema bilaterally with wounds dressed ? ?CBC: ?Recent Labs  ?Lab 01/22/22 ?7741 01/23/22 ?0357 01/24/22 ?0218 01/25/22 ?0815  ?WBC 9.5 10.7* 8.5 9.7  ?NEUTROABS 6.1 7.9* 5.6  --   ?HGB 9.5* 10.2* 9.7* 9.6*  ?HCT 30.4* 33.1* 32.0* 31.3*  ?MCV 98.4 96.8 99.4 98.1  ?PLT 120* 128* 133* 157  ? ? ?Basic Metabolic Panel: ?Recent Labs  ?Lab 01/20/22 ?0203 01/22/22 ?2878 01/25/22 ?6767  ?NA 135 134* 136  ?K 4.2 3.2* 3.7  ?CL 98 98 98  ?CO2 '28 26 25  '$ ?GLUCOSE 101* 112* 116*  ?BUN 28* 34* 27*  ?CREATININE 3.15* 3.23* 2.81*  ?CALCIUM 8.2* 8.0* 8.3*  ?PHOS 4.1  --  4.6  ? ? ?GFR: ?Estimated Creatinine Clearance: 29.5 mL/min (A) (by C-G formula based on SCr of 2.81 mg/dL (H)). ? ?Liver Function Tests: ?Recent Labs  ?Lab 01/20/22 ?0203 01/25/22 ?0816  ?ALBUMIN 1.9* 2.2*  ? ? ? ?HbA1C: ?Hgb A1c MFr Bld  ?Date/Time Value Ref Range Status  ?10/29/2021 08:52 AM 6.8 (H) 4.8 - 5.6 % Final  ?  Comment:  ?  (NOTE) ?Pre diabetes:          5.7%-6.4% ? ?Diabetes:              >6.4% ? ?Glycemic control for   <7.0% ?adults with diabetes ?  ?05/23/2020 07:55 PM 5.5 4.8 - 5.6 % Final  ?  Comment:  ?  (NOTE) ?Pre diabetes:          5.7%-6.4% ? ?Diabetes:              >6.4% ? ?Glycemic  control for   <7.0% ?adults with diabetes ?  ? ? ?CBG: ?Recent Labs  ?Lab 01/24/22 ?1650 01/24/22 ?2222 01/25/22 ?1220 01/25/22 ?1621 01/25/22 ?2158  ?GLUCAP 132* 92 84 198* 182*  ? ? ? ?Scheduled Meds: ? (feeding supplement) PROSource Plus  30 mL Oral BID BM  ? amoxicillin  500 mg Oral Q24H  ? apixaban  5 mg Oral BID  ? atorvastatin  20 mg Oral QHS  ? calcitRIOL  0.25 mcg Oral Daily  ? darbepoetin (ARANESP) injection - DIALYSIS  60 mcg Intravenous Q Thu-HD  ? docusate sodium  100 mg Oral BID  ? doxycycline  100 mg Oral Q12H  ? furosemide  80 mg Oral Once per day on Sun Mon Wed Fri  ? gabapentin  200 mg Oral TID  ? insulin aspart  0-6 Units Subcutaneous TID  WC  ? insulin aspart protamine- aspart  8 Units Subcutaneous BID WC  ? lidocaine  1 patch Transdermal Q24H  ? metoprolol tartrate  25 mg Oral BID  ? multivitamin with minerals  1 tablet Oral Once per day on Mon Thu  ? pantoprazole  40 mg Oral Daily  ? ? ? LOS: 13 days  ? ?Cherene Altes, MD ?[facility] ?Office  [phone] ?Pager - Text Page per Shea Evans ? ?If 7PM-7AM, please contact night-coverage per Amion ?01/26/2022, 8:40 AM ? ? ? ?

## 2022-01-26 NOTE — Progress Notes (Signed)
?[facility] ?Progress Note  ? ?Subjective:    ?Seen and examined patient at bedside. No complaints. Denies SOB and CP. Tolerated yesterday's HD with net UF 2.5L. Plan for HD 3/18. ? ?Objective ?Vitals:  ? 01/25/22 2146 01/26/22 0459 01/26/22 0504 01/26/22 0901  ?BP: 110/67  99/68 106/71  ?Pulse: 68  85 92  ?Resp:   17 18  ?Temp:   97.8 ?F (36.6 ?C)   ?TempSrc:   Oral   ?SpO2:   92% 97%  ?Weight:  88.2 kg    ?Height:      ? ?Physical Exam ?General: Ill-appearing; NAD ?Heart: S1 and S2; No murmurs, gallops, or rubs ?Lungs: Clear throughout ?Abdomen: Soft and non-tender ?Extremities: R BKA-ace wrap applied; LLE with ace wrap; no edema noted ?Dialysis Access: RUE AVF (+) B/T  ? ?Filed Weights  ? 01/25/22 0753 01/25/22 1133 01/26/22 0459  ?Weight: 89.7 kg 85.8 kg 88.2 kg  ? ? ?Intake/Output Summary (Last 24 hours) at 01/26/2022 0916 ?Last data filed at 01/26/2022 6948 ?Gross per 24 hour  ?Intake 780 ml  ?Output 2510 ml  ?Net -1730 ml  ? ? ?Additional Objective ?Labs: ?Basic Metabolic Panel: ?Recent Labs  ?Lab 01/20/22 ?0203 01/22/22 ?5462 01/25/22 ?7035  ?NA 135 134* 136  ?K 4.2 3.2* 3.7  ?CL 98 98 98  ?CO2 '28 26 25  '$ ?GLUCOSE 101* 112* 116*  ?BUN 28* 34* 27*  ?CREATININE 3.15* 3.23* 2.81*  ?CALCIUM 8.2* 8.0* 8.3*  ?PHOS 4.1  --  4.6  ? ?Liver Function Tests: ?Recent Labs  ?Lab 01/20/22 ?0203 01/25/22 ?0816  ?ALBUMIN 1.9* 2.2*  ? ?No results for input(s): LIPASE, AMYLASE in the last 168 hours. ?CBC: ?Recent Labs  ?Lab 01/21/22 ?0093 01/22/22 ?8182 01/23/22 ?0357 01/24/22 ?0218 01/25/22 ?0815  ?WBC 10.2 9.5 10.7* 8.5 9.7  ?NEUTROABS 7.8* 6.1 7.9* 5.6  --   ?HGB 10.1* 9.5* 10.2* 9.7* 9.6*  ?HCT 32.7* 30.4* 33.1* 32.0* 31.3*  ?MCV 99.4 98.4 96.8 99.4 98.1  ?PLT 123* 120* 128* 133* 157  ? ?Blood Culture ?   ?Component Value Date/Time  ? SDES WOUND 01/19/2022 1452  ? SPECREQUEST L3/L4 [place] ASPIRATION 01/19/2022 1452  ? CULT  01/19/2022 1452  ?  No growth aerobically or anaerobically. ?Performed at [facility], [place] [address], [place], [place] [address] ?  ? REPTSTATUS 01/24/2022 FINAL 01/19/2022 1452  ? ? ?Cardiac Enzymes: ?No results for input(s): CKTOTAL, CKMB, CKMBINDEX, TROPONINI in the last 168 hours. ?CBG: ?Recent Labs  ?Lab 01/24/22 ?1650 01/24/22 ?2222 01/25/22 ?1220 01/25/22 ?1621 01/25/22 ?2158  ?GLUCAP 132* 92 84 198* 182*  ? ?Iron Studies: No results for input(s): IRON, TIBC, TRANSFERRIN, FERRITIN in the last 72 hours. ?Lab Results  ?Component Value Date  ? INR 1.4 (H) 01/18/2022  ? INR 1.9 (H) 01/17/2022  ? INR 3.0 (H) 10/30/2021  ? ?Studies/Results: ?No results found. ? ?Medications: ? ? (feeding supplement) PROSource Plus  30 mL Oral BID BM  ? amoxicillin  500 mg Oral Q24H  ? apixaban  5 mg Oral BID  ? atorvastatin  20 mg Oral QHS  ? calcitRIOL  0.25 mcg Oral Daily  ? darbepoetin (ARANESP) injection - DIALYSIS  60 mcg Intravenous Q Thu-HD  ? docusate sodium  100 mg Oral BID  ? doxycycline  100 mg Oral Q12H  ? furosemide  80 mg Oral Once per day on Sun Mon Wed Fri  ? gabapentin  200 mg Oral TID  ? insulin aspart  0-6  Units Subcutaneous TID WC  ? insulin aspart protamine- aspart  8 Units Subcutaneous BID WC  ? lidocaine  1 patch Transdermal Q24H  ? metoprolol tartrate  25 mg Oral BID  ? multivitamin with minerals  1 tablet Oral Once per day on Mon Thu  ? pantoprazole  40 mg Oral Daily  ? ? ?Dialysis Orders: ?TTS - GOC ?4hrs55mn, BFR 400, DFR 800,  EDW 94kg, 3K/ 2.5Ca ?No Systemic Heparin ?Mircera 200 mcg IV q2wks - last 01/11/22 ?Calcitriol 0.226m PO qHD - last 01/13/22 ? ?Assessment/Plan: ?1 Lumbar back pain/Radiculopathy: Followed by Spine Surgery Dr. CrSaintclair HalstedMR Lumbar Spine completed: showed fractures L2-L3 with fluid-? Trauma vs. Infection. Neurosurgeon canceled surgery secondary to high risk ? L2-3 disc aspiration 01/19/2022 culture negative.Continue pain management and Pt/OT ?2. Ischemic changes R BKA/L leg-consulted by Dr. DuSharol Givenrecommends for BL AKAs on lower extremities. Awaiting patient consent  to proceed. ?3 ESRD - on HD TTS. Tolerated yesterday's HD with net UF 2.5L. Plan for HD 3/18 per usual schedule.  ?4  Hypertension/volume  -BP stable this a.m. 127/72,currently not volume overloaded. BP stable. Very much under OP EDW if wt is accurate. Lower EDW on discharge. UF as tolerated.  ?5. Anemia of CKD - Hgb 9.6. ESA due 01/25/2022.  60 mcg Aranesp ?6. Secondary Hyperparathyroidism - PO4 at goal. Continue VDRA. ?7. Nutrition - Renal diet on fluid restriction. Albumin very low. Start protein supps, renal vitamins  ?8. DMT2-per primary ?9. 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 Follows with cards. ?  ?CoTobie PoetNP ?[facility] ?01/26/2022,9:16 AM ? LOS: 13 days  ?  ?

## 2022-01-26 NOTE — Progress Notes (Signed)
Patient asking why he was transferred to this floor. Informed the patient that he came from the cath lab having a vascular procedure. Usually all vascular patients come to this floor.  ? ?Patient continues to ask multiple questions concerning his belongings and food. Whatever belongings per 41M, was packed and was brought to patient. ? ?Called again about his transfer board. 41M told this RN that they will check.  ?

## 2022-01-26 NOTE — Progress Notes (Signed)
Pt was DC in our floor. All his belonging we packed and kept in the cart including sunglass and bottle of salt. ?

## 2022-01-27 DIAGNOSIS — S88111A Complete traumatic amputation at level between knee and ankle, right lower leg, initial encounter: Secondary | ICD-10-CM | POA: Diagnosis not present

## 2022-01-27 DIAGNOSIS — I70209 Unspecified atherosclerosis of native arteries of extremities, unspecified extremity: Secondary | ICD-10-CM | POA: Diagnosis not present

## 2022-01-27 DIAGNOSIS — M5416 Radiculopathy, lumbar region: Secondary | ICD-10-CM | POA: Diagnosis not present

## 2022-01-27 DIAGNOSIS — T8781 Dehiscence of amputation stump: Secondary | ICD-10-CM | POA: Diagnosis not present

## 2022-01-27 LAB — HCG, SERUM, QUALITATIVE: Preg, Serum: NEGATIVE

## 2022-01-27 LAB — GLUCOSE, CAPILLARY
Glucose-Capillary: 195 mg/dL — ABNORMAL HIGH (ref 70–99)
Glucose-Capillary: 156 mg/dL — ABNORMAL HIGH (ref 70–99)
Glucose-Capillary: 238 mg/dL — ABNORMAL HIGH (ref 70–99)
Glucose-Capillary: 205 mg/dL — ABNORMAL HIGH (ref 70–99)

## 2022-01-27 MED ORDER — SODIUM CHLORIDE 0.9 % IV SOLN: 250.0000 mL | INTRAVENOUS | Status: DC | PRN

## 2022-01-27 MED ORDER — HEPARIN SODIUM (PORCINE) 1000 UNIT/ML IJ SOLN
INTRAMUSCULAR | Status: AC
  Filled 2022-01-27: qty 3

## 2022-01-27 MED ORDER — APIXABAN 5 MG PO TABS
5.0000 mg | ORAL_TABLET | Freq: Two times a day (BID) | ORAL | Status: DC
Start: 2022-01-27 — End: 2022-01-29
  Administered 2022-01-27: 5 mg via ORAL
  Filled 2022-01-27 (×2): qty 1

## 2022-01-27 MED ORDER — SODIUM CHLORIDE 0.9 % IV SOLN: INTRAVENOUS | Status: DC

## 2022-01-27 MED ORDER — SODIUM CHLORIDE 0.9% FLUSH
3.0000 mL | Freq: Two times a day (BID) | INTRAVENOUS | Status: DC
  Administered 2022-01-27: 3 mL via INTRAVENOUS

## 2022-01-27 MED ORDER — FENTANYL CITRATE PF 50 MCG/ML IJ SOSY: 12.5000 ug | PREFILLED_SYRINGE | INTRAMUSCULAR | Status: DC | PRN

## 2022-01-27 MED ORDER — SODIUM CHLORIDE 0.9% FLUSH: 3.0000 mL | INTRAVENOUS | Status: DC | PRN

## 2022-01-27 NOTE — Progress Notes (Signed)
Pt transferred to 39M06 per MD order without difficulty.  All patient belongings taken to new room including sliding board, and 70/30 insulin given to receiving RN on 39M.   ?

## 2022-01-27 NOTE — Progress Notes (Signed)
Patient received to 5M06.  ?Patient belongings including sliding board in room with patient. Patient comfortable with phone and call bell in reach, bed in lowest position. ? ?Aurther Loft, RN ?

## 2022-01-27 NOTE — H&P (View-Only) (Signed)
Patient ID: Alexander [name], male   DOB: 07-15-1961, 61 y.o.   MRN: [id number] ?Examination patient has dry gangrenous changes of the entire right transtibial amputation.  We will plan for right above-the-knee amputation on Wednesday.  Examination the left leg there is no further purulent drainage there is ischemic changes all the way up to the tibial tubercle.  I have reviewed patient's arteriogram studies with Dr. [name].  Patient's anastomosis to the bypass graft is distal to the popliteal fossa.  Patient would have difficulty healing above-knee amputation on the left.  With the ischemic changes in the left lower extremity healing of a below-knee amputation would also be difficult.  We will continue to observe the left lower extremity continue dry dressing changes and compression wraps with an Ace wrap to see if this improves or continues to deteriorate. ?

## 2022-01-27 NOTE — Progress Notes (Signed)
Patient ID: Alexander [name], male   DOB: [birth year], 61 y.o.   MRN: [id number] ?Examination patient has dry gangrenous changes of the entire right transtibial amputation.  We will plan for right above-the-knee amputation on Wednesday.  Examination the left leg there is no further purulent drainage there is ischemic changes all the way up to the tibial tubercle.  I have reviewed patient's arteriogram studies with Dr. [name].  Patient's anastomosis to the bypass graft is distal to the popliteal fossa.  Patient would have difficulty healing above-knee amputation on the left.  With the ischemic changes in the left lower extremity healing of a below-knee amputation would also be difficult.  We will continue to observe the left lower extremity continue dry dressing changes and compression wraps with an Ace wrap to see if this improves or continues to deteriorate. ?

## 2022-01-27 NOTE — Progress Notes (Signed)
? Alexander [name]  VPX:[id number] DOB: 04-Aug-1961 DOA: 01/13/2022 ?PCP: [name], MD   ? ?Brief Narrative:  ?61 year old with a history of ESRD on HD TTS, HTN, HLD, PVD status post right BKA and left midfoot amputation, A-fib on chronic Eliquis, CAD, DM2, and pacemaker placement who presented to the ED with intractable low back pain over several days with lower extremity radiation and radiculopathy. ? ?The patient underwent MRI with aspiration of a disc space 3/10.  Initially there was a plan for the patient to go to surgery for decompression of an area of spinal stenosis, but ultimately it was felt that his risk for surgery was too high and the decision was made to cancel surgery. ? ?Consultants:  ?Cardiology -Dr. [name] ?Neurosurgery ?Nephrology ?IR ?[name] ? ?Code Status: FULL CODE ? ?DVT prophylaxis: ?Eliquis  ? ?Interim Hx: ?The patient was taken to the Cath Lab yesterday for left lower extremity arteriogram to better define the exact anatomy of his bypass grafts.  Arteriogram has revealed that the patient cannot have a left AKA due to his bypass graft, but BKA could be considered below the graft anastomosis. Reports signif improvement in pain control today on current regimen. Pain not resolved, but much more tolerable. Denies cp or sob.  ? ?Assessment & Plan: ? ?Lumbar back pain - L2-3 severe spinal stenosis - radiculopathy of left lower extremity ?CT noted moderate neuroforaminal stenosis at L2-3 and L5-S1 -MRI lumbar spine noted subacute fractures L2-L3 disc and severe multifactorial spinal stenosis at L2-3 -followed by Dr. Saintclair Halsted with neurosurgery -surgical intervention canceled due to high risk -IR completed disc aspiration 3/10 with no evidence of infection -has completed a course of IV Decadron -goal at this time is pain management -PT/OT recommend SNF but patient refusing -adjusted Neurontin for neuropathic pain 3/16 -adjusted narcotic again for low back pain 3/16 ? ?PVD with dry gangrene of right  transtibial amputation site -left transmetatarsal wound poorly healing ?Followed in office by [name] - last seen as outpt 01/10/22 for weeping from B LE wounds - placed in compression wrapping - weeping persists here in hospital - [name] saw initially 01/16/22 w/ plan to continue trial w/ compression wrapping - has seen in f/u again 3/16 and initially suggested B LE AKA - arterial studies however make it clear AKA not possible on L due to arterial graft - plan presently is for R AKA only and ongoing observation of the L  ? ?ESRD on HD TTS ?Nephrology attending to dialysis ? ?Paroxysmal atrial fibrillation ?Continue Lopressor and Eliquis -heart rate controlled ? ?HTN ?Blood pressure well controlled ? ?Hyperlipidemia ?Continue statin ? ?DM 2 ?CBG well controlled ? ?Chronic pacemaker infection - Severe tricuspid regurgitation with flail tricuspid valve leaflet - possible vegetation anterior mitral leaflet ?Followed by ID in outpatient setting - continue suppressive therapy with doxycycline and amoxicillin indefinitely ? ?Pressure Injury 01/14/22 Ischial tuberosity Right Stage 3 -  Full thickness tissue loss. Subcutaneous fat may be visible but bone, tendon or muscle are NOT exposed. (Active)  ?01/14/22 0321  ?Location: Ischial tuberosity  ?Location Orientation: Right  ?Staging: Stage 3 -  Full thickness tissue loss. Subcutaneous fat may be visible but bone, tendon or muscle are NOT exposed.  ?Wound Description (Comments):   ?Present on Admission: Yes  ?   ?Pressure Injury 01/14/22 Buttocks Right Stage 2 -  Partial thickness loss of dermis presenting as a shallow open injury with a red, pink wound bed without slough. (Active)  ?01/14/22  0322  ?Location: Buttocks  ?Location Orientation: Right  ?Staging: Stage 2 -  Partial thickness loss of dermis presenting as a shallow open injury with a red, pink wound bed without slough.  ?Wound Description (Comments):   ?Present on Admission: Yes  ?   ?Pressure Injury 01/14/22  Buttocks Posterior;Proximal;Right Stage 2 -  Partial thickness loss of dermis presenting as a shallow open injury with a red, pink wound bed without slough. (Active)  ?01/14/22 0324  ?Location: Buttocks  ?Location Orientation: Posterior;Proximal;Right  ?Staging: Stage 2 -  Partial thickness loss of dermis presenting as a shallow open injury with a red, pink wound bed without slough.  ?Wound Description (Comments):   ?Present on Admission: Yes  ? ? ?Family Communication: no family present at time of exam today  ?Disposition: SNF recommended but patient refusing -also refusing HH -now awaiting planned R LE AKA ? ?Objective: ?Blood pressure 111/82, pulse 69, temperature 98.5 ?F (36.9 ?C), temperature source Oral, resp. rate 20, height 6' (1.829 m), weight 88.2 kg, SpO2 97 %. ? ?Intake/Output Summary (Last 24 hours) at 01/27/2022 1100 ?Last data filed at 01/26/2022 2326 ?Gross per 24 hour  ?Intake 240 ml  ?Output --  ?Net 240 ml  ? ? ?Filed Weights  ? 01/25/22 0753 01/25/22 1133 01/26/22 0459  ?Weight: 89.7 kg 85.8 kg 88.2 kg  ? ? ?Examination: ?General: No acute respiratory distress ?Lungs: Clear to auscultation bilaterally - no wheezing  ?Cardiovascular: Regular rate - no M  ?Abdomen: nondistended, soft ?Extremities: Trace edema bilaterally with wounds dressed ? ?CBC: ?Recent Labs  ?Lab 01/22/22 ?4132 01/23/22 ?0357 01/24/22 ?0218 01/25/22 ?4401 01/26/22 ?1112  ?WBC 9.5 10.7* 8.5 9.7 10.1  ?NEUTROABS 6.1 7.9* 5.6  --   --   ?HGB 9.5* 10.2* 9.7* 9.6* 9.4*  ?HCT 30.4* 33.1* 32.0* 31.3* 31.8*  ?MCV 98.4 96.8 99.4 98.1 100.6*  ?PLT 120* 128* 133* 157 145*  ? ? ?Basic Metabolic Panel: ?Recent Labs  ?Lab 01/22/22 ?0272 01/25/22 ?5366 01/26/22 ?1112  ?NA 134* 136 136  ?K 3.2* 3.7 4.2  ?CL 98 98 99  ?CO2 '26 25 25  '$ ?GLUCOSE 112* 116* 149*  ?BUN 34* 27* 23  ?CREATININE 3.23* 2.81* 2.69*  ?CALCIUM 8.0* 8.3* 8.3*  ?PHOS  --  4.6  --   ? ? ?GFR: ?Estimated Creatinine Clearance: 30.9 mL/min (A) (by C-G formula based on SCr of 2.69  mg/dL (H)). ? ?Liver Function Tests: ?Recent Labs  ?Lab 01/25/22 ?0816  ?ALBUMIN 2.2*  ? ? ? ?HbA1C: ?Hgb A1c MFr Bld  ?Date/Time Value Ref Range Status  ?10/29/2021 08:52 AM 6.8 (H) 4.8 - 5.6 % Final  ?  Comment:  ?  (NOTE) ?Pre diabetes:          5.7%-6.4% ? ?Diabetes:              >6.4% ? ?Glycemic control for   <7.0% ?adults with diabetes ?  ?05/23/2020 07:55 PM 5.5 4.8 - 5.6 % Final  ?  Comment:  ?  (NOTE) ?Pre diabetes:          5.7%-6.4% ? ?Diabetes:              >6.4% ? ?Glycemic control for   <7.0% ?adults with diabetes ?  ? ? ?CBG: ?Recent Labs  ?Lab 01/26/22 ?0744 01/26/22 ?1213 01/26/22 ?1630 01/26/22 ?2118 01/27/22 ?0610  ?GLUCAP 188* 132* 178* 160* 195*  ? ? ? ?Scheduled Meds: ? (feeding supplement) PROSource Plus  30 mL Oral BID BM  ?  amoxicillin  500 mg Oral Q24H  ? apixaban  5 mg Oral BID  ? atorvastatin  20 mg Oral QHS  ? calcitRIOL  0.25 mcg Oral Daily  ? Chlorhexidine Gluconate Cloth  6 each Topical Q0600  ? darbepoetin (ARANESP) injection - DIALYSIS  60 mcg Intravenous Q Thu-HD  ? docusate sodium  100 mg Oral BID  ? doxycycline  100 mg Oral Q12H  ? furosemide  80 mg Oral Once per day on Sun Mon Wed Fri  ? gabapentin  200 mg Oral TID  ? heparin sodium (porcine)      ? insulin aspart  0-6 Units Subcutaneous TID WC  ? insulin aspart protamine- aspart  8 Units Subcutaneous BID WC  ? lidocaine  1 patch Transdermal Q24H  ? metoprolol tartrate  25 mg Oral BID  ? multivitamin with minerals  1 tablet Oral Once per day on Mon Thu  ? pantoprazole  40 mg Oral Daily  ? sodium chloride flush  3 mL Intravenous Q12H  ? ? ? LOS: 14 days  ? ?Cherene Altes, MD ?[facility] ?Office  [phone] ?Pager - Text Page per Shea Evans ? ?If 7PM-7AM, please contact night-coverage per Amion ?01/27/2022, 11:00 AM ? ? ? ?

## 2022-01-27 NOTE — Progress Notes (Signed)
Dialysis Orders: ?TTS - GOC ?4hrs71mn, BFR 400, DFR 800,  EDW 94kg (will need to decr weight upon d/c), 3K/ 2.5Ca ?No Systemic Heparin ?Mircera 200 mcg IV q2wks - last 01/11/22 ?Calcitriol 0.271m PO qHD - last 01/13/22 ?  ?Problem/Plan ?1 Lumbar back pain/Radiculopathy: Followed by Spine Surgery Dr. CrSaintclair HalstedMR Lumbar Spine completed: showed fractures L2-L3 with fluid-? Trauma vs. Infection. Neurosurgeon canceled surgery secondary to high risk ? L2-3 disc aspiration 01/19/2022 culture negative.Continue pain management and Pt/OT. Per Dr. DuSharol Givenest option is b/l AKA; pt has not made up his mind if he agrees to proceed.  ? ?Eventually as outpt either will need PMD or pain clinic to manage. I d/w the pt and he knows nephrology will not write opioids at the outpatient centers. ? ?2 ESRD - on HD TTS. HD 01/23/2022 w/ net 2.7L, 3/16 2.5L net ? ?Seen on HD  ?3K bath through the RUA access ?115/75 2L -> 1.5 net UF as tolerated, BP had dropped ? ?3  Hypertension/volume  -BP stable this a.m. 100/63,currently not volume overloaded. BP lower but asymp. Very much under OP EDW if wt is accurate. Lower EDW on discharge. UF as tolerated.  ?4. Anemia of CKD - Hgb 10.2. ESA due 01/25/2022.  60 mcg Aranesp given 3/16 ?5. Secondary Hyperparathyroidism - PO4 at goal (4.1 on 3/11). Continue VDRA. ?6. Nutrition - Renal diet on fluid restriction. Albumin very low. Start protein supps, renal vitamins  ?7. DMT2-per primary ?8. 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 Follows with cards. ? ?Subjective: Seen in dialysis, main complaint is lower back pain. Denies f/c/n/v/dyspnea. ? ?Objective ?Vital signs in last 24 hours: ?Vitals:  ? 01/27/22 0735 01/27/22 0800 01/27/22 0830 01/27/22 0900  ?BP: 110/67 109/72 100/71 107/72  ?Pulse: 90 86 100 (!) 56  ?Resp: 19 20 (!) 21 (!) 22  ?Temp: 98.5 ?F (36.9 ?C)     ?TempSrc: Oral     ?SpO2: 97% 97%    ?Weight:      ?Height:      ? ?Weight change:  ? ?Physical Exam: ?General: Alert, chronically ill-appearing  male NAD  ?Heart: RRR no MRG ?Lungs: CTA nonlabored breathing ?Abdomen: Obese NABS, soft, NT ?Extremities: Right BKA slight thigh edema, left Unna boot has some left toe amps ? Dialysis Access: Right arm AV fistula positive bruit w/ needles ? ? ? ?01/27/2022,9:26 AM ? LOS: 14 days  ? ?Labs: ?Basic Metabolic Panel: ?Recent Labs  ?Lab 01/22/22 ?0443153/16/23 ?0840083/17/23 ?1112  ?NA 134* 136 136  ?K 3.2* 3.7 4.2  ?CL 98 98 99  ?CO2 '26 25 25  '$ ?GLUCOSE 112* 116* 149*  ?BUN 34* 27* 23  ?CREATININE 3.23* 2.81* 2.69*  ?CALCIUM 8.0* 8.3* 8.3*  ?PHOS  --  4.6  --   ? ?Liver Function Tests: ?Recent Labs  ?Lab 01/25/22 ?0816  ?ALBUMIN 2.2*  ? ?No results for input(s): LIPASE, AMYLASE in the last 168 hours. ?No results for input(s): AMMONIA in the last 168 hours. ?CBC: ?Recent Labs  ?Lab 01/22/22 ?0467613/14/23 ?0357 01/24/22 ?0218 01/25/22 ?0895093/17/23 ?1112  ?WBC 9.5 10.7* 8.5 9.7 10.1  ?NEUTROABS 6.1 7.9* 5.6  --   --   ?HGB 9.5* 10.2* 9.7* 9.6* 9.4*  ?HCT 30.4* 33.1* 32.0* 31.3* 31.8*  ?MCV 98.4 96.8 99.4 98.1 100.6*  ?PLT 120* 128* 133* 157 145*  ? ?Cardiac Enzymes: ?No results for input(s): CKTOTAL, CKMB, CKMBINDEX, TROPONINI in the last 168 hours. ?CBG: ?Recent Labs  ?Lab 01/26/22 ?0744 01/26/22 ?1213  01/26/22 ?1630 01/26/22 ?2118 01/27/22 ?4696  ?GLUCAP 188* 132* 178* 160* 195*  ? ? ?Studies/Results: ?PERIPHERAL VASCULAR CATHETERIZATION ? ?Result Date: 01/26/2022 ?Peripheral arteriogram 01/26/2022: Abdominal angiogram reveals presence of 2 renal arteries 1 on either sides.  The kidneys are atrophic.  Abdominal aorta shows mild atherosclerotic changes and calcification.  Aortoiliac bifurcation is widely patent.  Right iliac artery and right common femoral artery are widely patent and right SFA is occluded in the proximal segment. Left femoral arteriogram with distal runoff: The left SFA is occluded in the midsegment.  Heavily calcified chronic occlusion.  There is a graft present from the left femoral artery to the left  TP trunk which is widely patent.  There is paucity of vasculature about the left knee.  There is collaterals and single-vessel runoff in the form of posterior tibial artery below the left knee, one-vessel runoff.  AT is occluded and peroneal artery is diffusely diseased.  Collaterals are evident. Impression: Patient cannot have above-knee amputation in view of paucity of flow and collaterals and presence of bypass graft from left femoral artery to below the knee TP trunk.  However could look at below-knee amputation.  Discussed with [name].   ?Medications: ? sodium chloride    ? sodium chloride    ? sodium chloride    ? ? (feeding supplement) PROSource Plus  30 mL Oral BID BM  ? amoxicillin  500 mg Oral Q24H  ? apixaban  5 mg Oral BID  ? atorvastatin  20 mg Oral QHS  ? calcitRIOL  0.25 mcg Oral Daily  ? Chlorhexidine Gluconate Cloth  6 each Topical Q0600  ? darbepoetin (ARANESP) injection - DIALYSIS  60 mcg Intravenous Q Thu-HD  ? docusate sodium  100 mg Oral BID  ? doxycycline  100 mg Oral Q12H  ? furosemide  80 mg Oral Once per day on Sun Mon Wed Fri  ? gabapentin  200 mg Oral TID  ? insulin aspart  0-6 Units Subcutaneous TID WC  ? insulin aspart protamine- aspart  8 Units Subcutaneous BID WC  ? lidocaine  1 patch Transdermal Q24H  ? metoprolol tartrate  25 mg Oral BID  ? multivitamin with minerals  1 tablet Oral Once per day on Mon Thu  ? pantoprazole  40 mg Oral Daily  ? sodium chloride flush  3 mL Intravenous Q12H  ? ? ? ? ?

## 2022-01-27 NOTE — Progress Notes (Signed)
Subjective:  ?Sleeping comfortably, states that he has not had any complications from the procedure. ? ?Intake/Output from previous day: ? ?I/O last 3 completed shifts: ?In: 78 [P.O.:780] ?Out: 2510 [Other:2510] ?Total I/O ?In: 240 [P.O.:240] ?Out: -  ? ?Blood pressure 114/70, pulse 85, temperature 98.5 ?F (36.9 ?C), temperature source Oral, resp. rate 19, height 6' (1.829 m), weight 88.2 kg, SpO2 97 %. ?Physical Exam ?Vitals reviewed.  ?Constitutional:   ?   Appearance: He is obese.  ?   Comments: Appears older than stated age  ?Neck:  ?   Vascular: No carotid bruit or JVD.  ?Cardiovascular:  ?   Rate and Rhythm: Rhythm irregular.  ?   Heart sounds: S1 normal and S2 normal. No murmur heard. ?  No gallop.  ?Pulmonary:  ?   Effort: Pulmonary effort is normal. No respiratory distress.  ?   Breath sounds: No wheezing, rhonchi or rales.  ?Chest:  ?   Comments: Pacemaker in the left infraclavicular region site is clean dry and intact. ?Abdominal:  ?   General: Bowel sounds are normal.  ?   Palpations: Abdomen is soft.  ?Musculoskeletal:  ?   Comments: Left TMA c/d/I, right BKA stump  ? ? ?Lab Results: ?BMP ?BNP (last 3 results) ?Recent Labs  ?  10/25/21 ?1830  ?BNP 1,181.3*  ? ? ?ProBNP (last 3 results) ?No results for input(s): PROBNP in the last 8760 hours. ?BMP Latest Ref Rng & Units 01/26/2022 01/25/2022 01/22/2022  ?Glucose 70 - 99 mg/dL 149(H) 116(H) 112(H)  ?BUN 8 - 23 mg/dL 23 27(H) 34(H)  ?Creatinine 0.61 - 1.24 mg/dL 2.69(H) 2.81(H) 3.23(H)  ?Sodium 135 - 145 mmol/L 136 136 134(L)  ?Potassium 3.5 - 5.1 mmol/L 4.2 3.7 3.2(L)  ?Chloride 98 - 111 mmol/L 99 98 98  ?CO2 22 - 32 mmol/L '25 25 26  ' ?Calcium 8.9 - 10.3 mg/dL 8.3(L) 8.3(L) 8.0(L)  ? ?Hepatic Function Latest Ref Rng & Units 01/25/2022 01/20/2022 01/13/2022  ?Total Protein 6.5 - 8.1 g/dL - - 5.5(L)  ?Albumin 3.5 - 5.0 g/dL 2.2(L) 1.9(L) 2.4(L)  ?AST 15 - 41 U/L - - 25  ?ALT 0 - 44 U/L - - 19  ?Alk Phosphatase 38 - 126 U/L - - 91  ?Total Bilirubin 0.3 - 1.2 mg/dL  - - 0.9  ?Bilirubin, Direct 0.0 - 0.3 mg/dL - - -  ? ?CBC Latest Ref Rng & Units 01/26/2022 01/25/2022 01/24/2022  ?WBC 4.0 - 10.5 K/uL 10.1 9.7 8.5  ?Hemoglobin 13.0 - 17.0 g/dL 9.4(L) 9.6(L) 9.7(L)  ?Hematocrit 39.0 - 52.0 % 31.8(L) 31.3(L) 32.0(L)  ?Platelets 150 - 400 K/uL 145(L) 157 133(L)  ? ?Lipid Panel  ?   ?Component Value Date/Time  ? CHOL 147 11/20/2013 1422  ? TRIG 160 (H) 11/20/2013 1422  ? HDL 30 (L) 11/20/2013 1422  ? CHOLHDL 4.9 11/20/2013 1422  ? VLDL 32 11/20/2013 1422  ? [place] 85 11/20/2013 1422  ? ?Cardiac Panel (last 3 results) ?No results for input(s): CKTOTAL, CKMB, TROPONINI, RELINDX in the last 72 hours. ? ?HEMOGLOBIN A1C ?Lab Results  ?Component Value Date  ? HGBA1C 6.8 (H) 10/29/2021  ? MPG 148.46 10/29/2021  ? ? ? ?Scheduled Meds: ? (feeding supplement) PROSource Plus  30 mL Oral BID BM  ? amoxicillin  500 mg Oral Q24H  ? apixaban  5 mg Oral BID  ? atorvastatin  20 mg Oral QHS  ? calcitRIOL  0.25 mcg Oral Daily  ? Chlorhexidine Gluconate Cloth  6 each Topical  [id number]  ? darbepoetin (ARANESP) injection - DIALYSIS  60 mcg Intravenous Q Thu-HD  ? docusate sodium  100 mg Oral BID  ? doxycycline  100 mg Oral Q12H  ? furosemide  80 mg Oral Once per day on Sun Mon Wed Fri  ? gabapentin  200 mg Oral TID  ? insulin aspart  0-6 Units Subcutaneous TID WC  ? insulin aspart protamine- aspart  8 Units Subcutaneous BID WC  ? lidocaine  1 patch Transdermal Q24H  ? metoprolol tartrate  25 mg Oral BID  ? multivitamin with minerals  1 tablet Oral Once per day on Mon Thu  ? pantoprazole  40 mg Oral Daily  ? sodium chloride flush  3 mL Intravenous Q12H  ? ?Continuous Infusions: ? sodium chloride    ? sodium chloride    ? sodium chloride    ? ?PRN Meds:.sodium chloride, sodium chloride, sodium chloride, alteplase, bisacodyl, fentaNYL (SUBLIMAZE) injection, heparin, HYDROmorphone, lidocaine (PF), lidocaine-prilocaine, methocarbamol, Muscle Rub, ondansetron **OR** ondansetron (ZOFRAN) IV,  pentafluoroprop-tetrafluoroeth, sodium chloride, sodium chloride flush, traMADol, traZODone ? ?Assessment/Plan:  ?1.  Bilateral lower extremity stump gangrene, wet gangrene involving the left leg above the stump. ?2.  Paroxysmal atrial fibrillation. ?3.  Primary hypertension. ? ?Recommendation: I have reviewed the results of the angiogram with [name].  Patient is not a candidate for about knee amputation as the femoral -TP trunk bypass is keeping the limb alive.  He could potentially attempt below-knee amputation below the bypass graft anastomosis. ? ?Right groin site has healed well without any complication.  Please call if questions. ? ? ? ? ?Adrian Prows, MD, [facility] ?01/27/2022, 5:01 AM ?Office: [phone] ?Fax: [phone] ?Pager: [phone]  ?

## 2022-01-28 DIAGNOSIS — M5416 Radiculopathy, lumbar region: Secondary | ICD-10-CM | POA: Diagnosis not present

## 2022-01-28 LAB — GLUCOSE, CAPILLARY
Glucose-Capillary: 209 mg/dL — ABNORMAL HIGH (ref 70–99)
Glucose-Capillary: 249 mg/dL — ABNORMAL HIGH (ref 70–99)
Glucose-Capillary: 183 mg/dL — ABNORMAL HIGH (ref 70–99)
Glucose-Capillary: 102 mg/dL — ABNORMAL HIGH (ref 70–99)

## 2022-01-28 MED ORDER — DARBEPOETIN ALFA 100 MCG/0.5ML IJ SOSY: 100.0000 ug | PREFILLED_SYRINGE | INTRAMUSCULAR | Status: DC

## 2022-01-28 MED ORDER — INSULIN ASPART PROT & ASPART (70-30 MIX) 100 UNIT/ML ~~LOC~~ SUSP
10.0000 [IU] | Freq: Two times a day (BID) | SUBCUTANEOUS | Status: DC
Start: 2022-01-28 — End: 2022-02-15
  Administered 2022-01-28 – 2022-02-14 (×25): 10 [IU] via SUBCUTANEOUS

## 2022-01-28 NOTE — Progress Notes (Signed)
?[facility] ?Progress Note  ? ?Subjective:    ?Seen and examined patient at bedside. No complaints. Tolerated yesterday's HD with net UF 1.5L. Plan for HD 3/21. ? ?Objective ?Vitals:  ? 01/27/22 1800 01/27/22 1845 01/27/22 2049 01/28/22 1610  ?BP: 122/68 (!) 133/57 131/82 (!) 111/48  ?Pulse: (!) 110 100 (!) 106 98  ?Resp: '18 17 17 17  '$ ?Temp: 97.8 ?F (36.6 ?C) 97.8 ?F (36.6 ?C) 98.1 ?F (36.7 ?C) (!) 97.5 ?F (36.4 ?C)  ?TempSrc: Oral Oral Oral Oral  ?SpO2: 93% 95% 93% 99%  ?Weight:   88.7 kg   ?Height:      ? ?Physical Exam ?General: Ill-appearing; NAD ?Heart: S1 and S2; No murmurs, gallops, or rubs ?Lungs: Clear throughout ?Abdomen: Soft and non-tender ?Extremities: R BKA-ace wrap applied; LLE with ace wrap; no edema noted ?Dialysis Access: RUE AVF (+) B/T  ? ?Filed Weights  ? 01/25/22 1133 01/26/22 0459 01/27/22 2049  ?Weight: 85.8 kg 88.2 kg 88.7 kg  ? ? ?Intake/Output Summary (Last 24 hours) at 01/28/2022 0816 ?Last data filed at 01/28/2022 0600 ?Gross per 24 hour  ?Intake 680 ml  ?Output 1500 ml  ?Net -820 ml  ? ? ?Additional Objective ?Labs: ?Basic Metabolic Panel: ?Recent Labs  ?Lab 01/22/22 ?9604 01/25/22 ?5409 01/26/22 ?1112  ?NA 134* 136 136  ?K 3.2* 3.7 4.2  ?CL 98 98 99  ?CO2 '26 25 25  '$ ?GLUCOSE 112* 116* 149*  ?BUN 34* 27* 23  ?CREATININE 3.23* 2.81* 2.69*  ?CALCIUM 8.0* 8.3* 8.3*  ?PHOS  --  4.6  --   ? ?Liver Function Tests: ?Recent Labs  ?Lab 01/25/22 ?0816  ?ALBUMIN 2.2*  ? ?No results for input(s): LIPASE, AMYLASE in the last 168 hours. ?CBC: ?Recent Labs  ?Lab 01/22/22 ?8119 01/23/22 ?0357 01/24/22 ?0218 01/25/22 ?1478 01/26/22 ?1112  ?WBC 9.5 10.7* 8.5 9.7 10.1  ?NEUTROABS 6.1 7.9* 5.6  --   --   ?HGB 9.5* 10.2* 9.7* 9.6* 9.4*  ?HCT 30.4* 33.1* 32.0* 31.3* 31.8*  ?MCV 98.4 96.8 99.4 98.1 100.6*  ?PLT 120* 128* 133* 157 145*  ? ?Blood Culture ?   ?Component Value Date/Time  ? SDES WOUND 01/19/2022 1452  ? SPECREQUEST L3/L4 Lipan ASPIRATION 01/19/2022 1452  ? CULT  01/19/2022 1452  ?  No  growth aerobically or anaerobically. ?Performed at [facility], [place] [address], [place], [place] [address] ?  ? REPTSTATUS 01/24/2022 FINAL 01/19/2022 1452  ? ? ?Cardiac Enzymes: ?No results for input(s): CKTOTAL, CKMB, CKMBINDEX, TROPONINI in the last 168 hours. ?CBG: ?Recent Labs  ?Lab 01/27/22 ?0610 01/27/22 ?1320 01/27/22 ?1608 01/27/22 ?2133 01/28/22 ?0731  ?GLUCAP 195* 156* 238* 205* 209*  ? ?Iron Studies: No results for input(s): IRON, TIBC, TRANSFERRIN, FERRITIN in the last 72 hours. ?Lab Results  ?Component Value Date  ? INR 1.4 (H) 01/18/2022  ? INR 1.9 (H) 01/17/2022  ? INR 3.0 (H) 10/30/2021  ? ?Studies/Results: ?PERIPHERAL VASCULAR CATHETERIZATION ? ?Result Date: 01/26/2022 ?Peripheral arteriogram 01/26/2022: Abdominal angiogram reveals presence of 2 renal arteries 1 on either sides.  The kidneys are atrophic.  Abdominal aorta shows mild atherosclerotic changes and calcification.  Aortoiliac bifurcation is widely patent.  Right iliac artery and right common femoral artery are widely patent and right SFA is occluded in the proximal segment. Left femoral arteriogram with distal runoff: The left SFA is occluded in the midsegment.  Heavily calcified chronic occlusion.  There is a graft present from the left femoral artery to the left TP  trunk which is widely patent.  There is paucity of vasculature about the left knee.  There is collaterals and single-vessel runoff in the form of posterior tibial artery below the left knee, one-vessel runoff.  AT is occluded and peroneal artery is diffusely diseased.  Collaterals are evident. Impression: Patient cannot have above-knee amputation in view of paucity of flow and collaterals and presence of bypass graft from left femoral artery to below the knee TP trunk.  However could look at below-knee amputation.  Discussed with [name].   ? ?Medications: ? sodium chloride    ? sodium chloride    ? ? (feeding supplement) PROSource Plus  30 mL Oral BID BM  ?  amoxicillin  500 mg Oral Q24H  ? apixaban  5 mg Oral BID  ? atorvastatin  20 mg Oral QHS  ? calcitRIOL  0.25 mcg Oral Daily  ? Chlorhexidine Gluconate Cloth  6 each Topical Q0600  ? darbepoetin (ARANESP) injection - DIALYSIS  60 mcg Intravenous Q Thu-HD  ? docusate sodium  100 mg Oral BID  ? doxycycline  100 mg Oral Q12H  ? furosemide  80 mg Oral Once per day on Sun Mon Wed Fri  ? gabapentin  200 mg Oral TID  ? insulin aspart  0-6 Units Subcutaneous TID WC  ? insulin aspart protamine- aspart  8 Units Subcutaneous BID WC  ? lidocaine  1 patch Transdermal Q24H  ? metoprolol tartrate  25 mg Oral BID  ? multivitamin with minerals  1 tablet Oral Once per day on Mon Thu  ? pantoprazole  40 mg Oral Daily  ? sodium chloride flush  3 mL Intravenous Q12H  ? ? ?Dialysis Orders: ?TTS - GOC ?4hrs52mn, BFR 400, DFR 800,  EDW 94kg, 3K/ 2.5Ca ?No Systemic Heparin ?Mircera 200 mcg IV q2wks - last 01/11/22 ?Calcitriol 0.225m PO qHD - last 01/13/22 ? ?Assessment/Plan: ?1 Lumbar back pain/Radiculopathy: Followed by Spine Surgery Dr. CrSaintclair HalstedMR Lumbar Spine completed: showed fractures L2-L3 with fluid-? Trauma vs. Infection. Neurosurgeon canceled surgery secondary to high risk. L2-3 disc aspiration 01/19/2022 culture negative.Continue pain management and Pt/OT. Eventually as outpt either will need PMD or pain clinic to manage. I d/w the pt and he knows nephrology will not write opioids at the outpatient centers. Reviewed Dr. DuJess Bartersast note, plan for R AVA on Wednesday 3/22, continue to observe LLE.  ?  ?2 ESRD - on HD TTS. Plan for HD 3/21 ?  ?3  Hypertension/volume  -BP stable this a.m. 100/63,currently not volume overloaded. BP lower but asymp. Very much under OP EDW if wt is accurate. Lower EDW on discharge. UF as tolerated.  ?4. Anemia of CKD - Hgb 9.4. 60 mcg Aranesp given 3/16. Will raise dose. ?5. Secondary Hyperparathyroidism - PO4 at goal (4.6 on 3/16). Continue VDRA. ?6. Nutrition - Renal diet on fluid restriction. Albumin very  low. On protein supps, renal vitamins  ?7. DMT2-per primary ?8. 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 Follows with cards. ? ?CoTobie PoetNP ?[facility] ?01/28/2022,8:16 AM ? LOS: 15 days  ?  ?

## 2022-01-28 NOTE — Progress Notes (Signed)
? Alexander [name]  WUJ:[id number] DOB: 11/20/1960 DOA: 01/13/2022 ?PCP: [name], MD   ? ?Brief Narrative:  ?61 year old with a history of ESRD on HD TTS, HTN, HLD, PVD status post right BKA and left midfoot amputation, A-fib on chronic Eliquis, CAD, DM2, and pacemaker placement who presented to the ED with intractable low back pain over several days with lower extremity radiation and radiculopathy. ? ?The patient underwent MRI with aspiration of a disc space 3/10.  Initially there was a plan for the patient to go to surgery for decompression of an area of spinal stenosis, but ultimately it was felt that his risk for surgery was too high and the decision was made to cancel surgery. ? ?Consultants:  ?Cardiology -Dr. [name] ?Neurosurgery ?Nephrology ?IR ?[name] ? ?Code Status: FULL CODE ? ?DVT prophylaxis: ?Eliquis  ? ?Interim Hx: ?No acute events reported overnight.  Afebrile.  Vital signs stable.  CBG remains elevated above goal.  Resting comfortably at the time of visit. ? ?Assessment & Plan: ? ?Lumbar back pain - L2-3 severe spinal stenosis - radiculopathy of left lower extremity ?CT noted moderate neuroforaminal stenosis at L2-3 and L5-S1 -MRI lumbar spine noted subacute fractures L2-L3 disc and severe multifactorial spinal stenosis at L2-3 -followed by Dr. Saintclair Halsted with neurosurgery -surgical intervention canceled due to high risk -IR completed disc aspiration 3/10 with no evidence of infection -has completed a course of IV Decadron -goal at this time is pain management -PT/OT recommend SNF but patient refusing -adjusted Neurontin for neuropathic pain 3/16 -adjusted narcotic again for low back pain 3/16 ? ?PVD with dry gangrene of right transtibial amputation site -left transmetatarsal wound poorly healing ?Followed in office by [name] - last seen as outpt 01/10/22 for weeping from B LE wounds - placed in compression wrapping - weeping persists here in hospital - [name] saw initially 01/16/22 w/ plan to  continue trial w/ compression wrapping - was seen in f/u again 3/16 and initially suggested B LE AKA - arterial studies however make it clear AKA not possible on L due to arterial graft - plan presently is for R AKA only 3/22 and ongoing observation of the L  ? ?ESRD on HD TTS ?Nephrology attending to dialysis ? ?Paroxysmal atrial fibrillation ?Continue Lopressor and Eliquis - heart rate controlled ? ?HTN ?Blood pressure well controlled ? ?Hyperlipidemia ?Continue statin ? ?DM 2 ?CBG above goal -adjust treatment and follow ? ?Chronic pacemaker infection - Severe tricuspid regurgitation with flail tricuspid valve leaflet - possible vegetation anterior mitral leaflet ?Followed by ID in outpatient setting - continue suppressive therapy with doxycycline and amoxicillin indefinitely ? ?Pressure Injury 01/14/22 Ischial tuberosity Right Stage 3 -  Full thickness tissue loss. Subcutaneous fat may be visible but bone, tendon or muscle are NOT exposed. (Active)  ?01/14/22 0321  ?Location: Ischial tuberosity  ?Location Orientation: Right  ?Staging: Stage 3 -  Full thickness tissue loss. Subcutaneous fat may be visible but bone, tendon or muscle are NOT exposed.  ?Wound Description (Comments):   ?Present on Admission: Yes  ?   ?Pressure Injury 01/14/22 Buttocks Right Stage 2 -  Partial thickness loss of dermis presenting as a shallow open injury with a red, pink wound bed without slough. (Active)  ?01/14/22 0322  ?Location: Buttocks  ?Location Orientation: Right  ?Staging: Stage 2 -  Partial thickness loss of dermis presenting as a shallow open injury with a red, pink wound bed without slough.  ?Wound Description (Comments):   ?Present on  Admission: Yes  ?   ?Pressure Injury 01/14/22 Buttocks Posterior;Proximal;Right Stage 2 -  Partial thickness loss of dermis presenting as a shallow open injury with a red, pink wound bed without slough. (Active)  ?01/14/22 0324  ?Location: Buttocks  ?Location Orientation:  Posterior;Proximal;Right  ?Staging: Stage 2 -  Partial thickness loss of dermis presenting as a shallow open injury with a red, pink wound bed without slough.  ?Wound Description (Comments):   ?Present on Admission: Yes  ? ? ?Family Communication: No family present at time of visit ?Disposition: SNF recommended but patient refusing -also refusing HH -now awaiting planned R LE AKA ? ?Objective: ?Blood pressure 106/80, pulse 96, temperature (!) 97.5 ?F (36.4 ?C), temperature source Oral, resp. rate 17, height 6' (1.829 m), weight 88.7 kg, SpO2 99 %. ? ?Intake/Output Summary (Last 24 hours) at 01/28/2022 6629 ?Last data filed at 01/28/2022 0900 ?Gross per 24 hour  ?Intake 1000 ml  ?Output 1500 ml  ?Net -500 ml  ? ? ?Filed Weights  ? 01/25/22 1133 01/26/22 0459 01/27/22 2049  ?Weight: 85.8 kg 88.2 kg 88.7 kg  ? ? ?Examination: ?General: No acute respiratory distress ?Lungs: Clear to auscultation bilaterally ?Cardiovascular: Regular rate without murmur ?Abdomen: nondistended, soft ?Extremities: Trace edema bilaterally with wounds dressed ? ?CBC: ?Recent Labs  ?Lab 01/22/22 ?4765 01/23/22 ?0357 01/24/22 ?0218 01/25/22 ?4650 01/26/22 ?1112  ?WBC 9.5 10.7* 8.5 9.7 10.1  ?NEUTROABS 6.1 7.9* 5.6  --   --   ?HGB 9.5* 10.2* 9.7* 9.6* 9.4*  ?HCT 30.4* 33.1* 32.0* 31.3* 31.8*  ?MCV 98.4 96.8 99.4 98.1 100.6*  ?PLT 120* 128* 133* 157 145*  ? ? ?Basic Metabolic Panel: ?Recent Labs  ?Lab 01/22/22 ?3546 01/25/22 ?5681 01/26/22 ?1112  ?NA 134* 136 136  ?K 3.2* 3.7 4.2  ?CL 98 98 99  ?CO2 '26 25 25  '$ ?GLUCOSE 112* 116* 149*  ?BUN 34* 27* 23  ?CREATININE 3.23* 2.81* 2.69*  ?CALCIUM 8.0* 8.3* 8.3*  ?PHOS  --  4.6  --   ? ? ?GFR: ?Estimated Creatinine Clearance: 30.9 mL/min (A) (by C-G formula based on SCr of 2.69 mg/dL (H)). ? ?Liver Function Tests: ?Recent Labs  ?Lab 01/25/22 ?0816  ?ALBUMIN 2.2*  ? ? ? ?HbA1C: ?Hgb A1c MFr Bld  ?Date/Time Value Ref Range Status  ?10/29/2021 08:52 AM 6.8 (H) 4.8 - 5.6 % Final  ?  Comment:  ?  (NOTE) ?Pre  diabetes:          5.7%-6.4% ? ?Diabetes:              >6.4% ? ?Glycemic control for   <7.0% ?adults with diabetes ?  ?05/23/2020 07:55 PM 5.5 4.8 - 5.6 % Final  ?  Comment:  ?  (NOTE) ?Pre diabetes:          5.7%-6.4% ? ?Diabetes:              >6.4% ? ?Glycemic control for   <7.0% ?adults with diabetes ?  ? ? ?CBG: ?Recent Labs  ?Lab 01/27/22 ?0610 01/27/22 ?1320 01/27/22 ?1608 01/27/22 ?2133 01/28/22 ?0731  ?GLUCAP 195* 156* 238* 205* 209*  ? ? ? ?Scheduled Meds: ? (feeding supplement) PROSource Plus  30 mL Oral BID BM  ? amoxicillin  500 mg Oral Q24H  ? apixaban  5 mg Oral BID  ? atorvastatin  20 mg Oral QHS  ? calcitRIOL  0.25 mcg Oral Daily  ? Chlorhexidine Gluconate Cloth  6 each Topical Q0600  ? [START ON 02/01/2022] darbepoetin (ARANESP)  injection - DIALYSIS  100 mcg Intravenous Q Thu-HD  ? docusate sodium  100 mg Oral BID  ? doxycycline  100 mg Oral Q12H  ? furosemide  80 mg Oral Once per day on Sun Mon Wed Fri  ? gabapentin  200 mg Oral TID  ? insulin aspart  0-6 Units Subcutaneous TID WC  ? insulin aspart protamine- aspart  8 Units Subcutaneous BID WC  ? lidocaine  1 patch Transdermal Q24H  ? metoprolol tartrate  25 mg Oral BID  ? multivitamin with minerals  1 tablet Oral Once per day on Mon Thu  ? pantoprazole  40 mg Oral Daily  ? sodium chloride flush  3 mL Intravenous Q12H  ? ? ? LOS: 15 days  ? ?Cherene Altes, MD ?[facility] ?Office  [phone] ?Pager - Text Page per Shea Evans ? ?If 7PM-7AM, please contact night-coverage per Amion ?01/28/2022, 9:22 AM ? ? ? ?

## 2022-01-29 ENCOUNTER — Encounter (HOSPITAL_COMMUNITY): Payer: Self-pay | Admitting: Cardiology

## 2022-01-29 DIAGNOSIS — M5416 Radiculopathy, lumbar region: Secondary | ICD-10-CM | POA: Diagnosis not present

## 2022-01-29 LAB — RENAL FUNCTION PANEL
Albumin: 2 g/dL — ABNORMAL LOW (ref 3.5–5.0)
Anion gap: 9 (ref 5–15)
BUN: 33 mg/dL — ABNORMAL HIGH (ref 8–23)
CO2: 21 mmol/L — ABNORMAL LOW (ref 22–32)
Calcium: 8.3 mg/dL — ABNORMAL LOW (ref 8.9–10.3)
Chloride: 100 mmol/L (ref 98–111)
Creatinine, Ser: 3.45 mg/dL — ABNORMAL HIGH (ref 0.61–1.24)
GFR, Estimated: 19 mL/min — ABNORMAL LOW (ref >60)
Glucose, Bld: 122 mg/dL — ABNORMAL HIGH (ref 70–99)
Phosphorus: 4.6 mg/dL (ref 2.5–4.6)
Potassium: 5.5 mmol/L — ABNORMAL HIGH (ref 3.5–5.1)
Sodium: 130 mmol/L — ABNORMAL LOW (ref 135–145)

## 2022-01-29 LAB — GLUCOSE, CAPILLARY
Glucose-Capillary: 146 mg/dL — ABNORMAL HIGH (ref 70–99)
Glucose-Capillary: 134 mg/dL — ABNORMAL HIGH (ref 70–99)
Glucose-Capillary: 132 mg/dL — ABNORMAL HIGH (ref 70–99)
Glucose-Capillary: 115 mg/dL — ABNORMAL HIGH (ref 70–99)

## 2022-01-29 LAB — CBC
HCT: 33.4 % — ABNORMAL LOW (ref 39.0–52.0)
Hemoglobin: 10.1 g/dL — ABNORMAL LOW (ref 13.0–17.0)
MCH: 29.3 pg (ref 26.0–34.0)
MCHC: 30.2 g/dL (ref 30.0–36.0)
MCV: 96.8 fL (ref 80.0–100.0)
Platelets: UNDETERMINED 10*3/uL (ref 150–400)
RBC: 3.45 MIL/uL — ABNORMAL LOW (ref 4.22–5.81)
RDW: 17.3 % — ABNORMAL HIGH (ref 11.5–15.5)
WBC: 9.9 10*3/uL (ref 4.0–10.5)
nRBC: 0 % (ref 0.0–0.2)

## 2022-01-29 MED ORDER — APIXABAN 5 MG PO TABS
5.0000 mg | ORAL_TABLET | Freq: Two times a day (BID) | ORAL | Status: DC
  Administered 2022-02-01 – 2022-02-14 (×25): 5 mg via ORAL
  Filled 2022-01-29 (×25): qty 1

## 2022-01-29 MED ORDER — SODIUM CHLORIDE 0.9 % IV SOLN: 100.0000 mL | INTRAVENOUS | Status: DC | PRN

## 2022-01-29 NOTE — Progress Notes (Signed)
? Alexander [name]  FTD:[id number] DOB: 1961-07-25 DOA: 01/13/2022 ?PCP: [name], MD   ? ?Brief Narrative:  ?61 year old with a history of ESRD on HD TTS, HTN, HLD, PVD status post right BKA and left midfoot amputation, A-fib on chronic Eliquis, CAD, DM2, and pacemaker placement who presented to the ED with intractable low back pain over several days with lower extremity radiation and radiculopathy. ? ?The patient underwent MRI with aspiration of a disc space 3/10.  Initially there was a plan for the patient to go to surgery for decompression of an area of spinal stenosis, but ultimately it was felt that his risk for surgery was too high and the decision was made to cancel surgery. ? ?Consultants:  ?Cardiology -Dr. [name] ?Neurosurgery ?Nephrology ?IR ?[name] ? ?Code Status: FULL CODE ? ?DVT prophylaxis: ?Eliquis  ? ?Interim Hx: ?Afebrile.  Vital signs stable.  No acute events reported overnight.  CBG better controlled.  Reports better pain control overall but still experiencing paroxysmal's of poorly controlled pain.  No shortness of breath or chest pain.  Tells me he understands his upcoming surgical procedure. ? ?Assessment & Plan: ? ?Lumbar back pain - L2-3 severe spinal stenosis - radiculopathy of left lower extremity ?CT noted moderate neuroforaminal stenosis at L2-3 and L5-S1 - MRI lumbar spine noted subacute fractures L2-L3 disc and severe multifactorial spinal stenosis at L2-3 - followed by Dr. Saintclair Halsted with Neurosurgery - surgical intervention canceled due to high risk - IR completed disc aspiration 3/10 with no evidence of infection - has completed a course of IV Decadron - goal at this time is pain management - PT/OT recommend SNF but patient refused - adjusted Neurontin for neuropathic pain 3/16 -adjusted narcotic again for low back pain 3/16 - pain has been much better controlled for the past few days -continue with attempts to fine-tune management ? ?PVD with dry gangrene of right transtibial  amputation site -left transmetatarsal wound poorly healing ?Followed in office by [name] - last seen as outpt 01/10/22 for weeping from B LE wounds - placed in compression wrapping - weeping persists here in hospital - [name] saw 01/16/22 w/ plan to continue trial w/ compression wrapping - was seen in f/u again 3/16 and initially suggested B LE AKA - arterial studies however make it clear AKA not possible on L due to arterial graft - plan presently is for R AKA only 3/22 and ongoing observation of the L with wound care ? ?ESRD on HD TTS ?Nephrology attending to dialysis ? ?Paroxysmal atrial fibrillation ?Continue Lopressor and Eliquis - heart rate controlled ? ?HTN ?Blood pressure well controlled ? ?Hyperlipidemia ?Continue statin ? ?DM 2 ?CBG improved with adjustment in treatment yesterday ? ?Chronic pacemaker infection - Severe tricuspid regurgitation with flail tricuspid valve leaflet - possible vegetation anterior mitral leaflet ?Followed by ID in outpatient setting - continue suppressive therapy with doxycycline and amoxicillin indefinitely ? ?Pressure Injury 01/14/22 Ischial tuberosity Right Stage 3 -  Full thickness tissue loss. Subcutaneous fat may be visible but bone, tendon or muscle are NOT exposed. (Active)  ?01/14/22 0321  ?Location: Ischial tuberosity  ?Location Orientation: Right  ?Staging: Stage 3 -  Full thickness tissue loss. Subcutaneous fat may be visible but bone, tendon or muscle are NOT exposed.  ?Wound Description (Comments):   ?Present on Admission: Yes  ?   ?Pressure Injury 01/14/22 Buttocks Right Stage 2 -  Partial thickness loss of dermis presenting as a shallow open injury with a red, pink  wound bed without slough. (Active)  ?01/14/22 0322  ?Location: Buttocks  ?Location Orientation: Right  ?Staging: Stage 2 -  Partial thickness loss of dermis presenting as a shallow open injury with a red, pink wound bed without slough.  ?Wound Description (Comments):   ?Present on Admission: Yes  ?    ?Pressure Injury 01/14/22 Buttocks Posterior;Proximal;Right Stage 2 -  Partial thickness loss of dermis presenting as a shallow open injury with a red, pink wound bed without slough. (Active)  ?01/14/22 0324  ?Location: Buttocks  ?Location Orientation: Posterior;Proximal;Right  ?Staging: Stage 2 -  Partial thickness loss of dermis presenting as a shallow open injury with a red, pink wound bed without slough.  ?Wound Description (Comments):   ?Present on Admission: Yes  ? ? ?Family Communication: No family present at time of exam ?Disposition: SNF recommended but patient refusing -also refusing HH -now awaiting planned R LE AKA ? ?Objective: ?Blood pressure 109/69, pulse 94, temperature 98.2 ?F (36.8 ?C), temperature source Oral, resp. rate 17, height 6' (1.829 m), weight 88.7 kg, SpO2 98 %. ? ?Intake/Output Summary (Last 24 hours) at 01/29/2022 0828 ?Last data filed at 01/29/2022 0600 ?Gross per 24 hour  ?Intake 1240 ml  ?Output 0 ml  ?Net 1240 ml  ? ? ?Filed Weights  ? 01/25/22 1133 01/26/22 0459 01/27/22 2049  ?Weight: 85.8 kg 88.2 kg 88.7 kg  ? ? ?Examination: ?General: No acute respiratory distress -alert and conversant ?Lungs: Clear to auscultation bilaterally ?Cardiovascular: Regular rate without murmur ?Abdomen: nondistended, soft, bowel sounds positive, no mass ?Extremities: Trace edema bilaterally with wounds dressed ? ?CBC: ?Recent Labs  ?Lab 01/23/22 ?0357 01/24/22 ?0218 01/25/22 ?4259 01/26/22 ?1112 01/29/22 ?0602  ?WBC 10.7* 8.5 9.7 10.1 9.9  ?NEUTROABS 7.9* 5.6  --   --   --   ?HGB 10.2* 9.7* 9.6* 9.4* 10.1*  ?HCT 33.1* 32.0* 31.3* 31.8* 33.4*  ?MCV 96.8 99.4 98.1 100.6* 96.8  ?PLT 128* 133* 157 145* PLATELET CLUMPS NOTED ON SMEAR, UNABLE TO ESTIMATE  ? ? ?Basic Metabolic Panel: ?Recent Labs  ?Lab 01/25/22 ?0816 01/26/22 ?1112 01/29/22 ?0602  ?NA 136 136 130*  ?K 3.7 4.2 5.5*  ?CL 98 99 100  ?CO2 25 25 21*  ?GLUCOSE 116* 149* 122*  ?BUN 27* 23 33*  ?CREATININE 2.81* 2.69* 3.45*  ?CALCIUM 8.3* 8.3* 8.3*   ?PHOS 4.6  --  4.6  ? ? ?GFR: ?Estimated Creatinine Clearance: 24.1 mL/min (A) (by C-G formula based on SCr of 3.45 mg/dL (H)). ? ?Liver Function Tests: ?Recent Labs  ?Lab 01/25/22 ?0816 01/29/22 ?0602  ?ALBUMIN 2.2* 2.0*  ? ? ? ?HbA1C: ?Hgb A1c MFr Bld  ?Date/Time Value Ref Range Status  ?10/29/2021 08:52 AM 6.8 (H) 4.8 - 5.6 % Final  ?  Comment:  ?  (NOTE) ?Pre diabetes:          5.7%-6.4% ? ?Diabetes:              >6.4% ? ?Glycemic control for   <7.0% ?adults with diabetes ?  ?05/23/2020 07:55 PM 5.5 4.8 - 5.6 % Final  ?  Comment:  ?  (NOTE) ?Pre diabetes:          5.7%-6.4% ? ?Diabetes:              >6.4% ? ?Glycemic control for   <7.0% ?adults with diabetes ?  ? ? ?CBG: ?Recent Labs  ?Lab 01/28/22 ?0731 01/28/22 ?1122 01/28/22 ?1606 01/28/22 ?2111 01/29/22 ?0730  ?GLUCAP 209* 249* 183* 102* 146*  ? ? ? ?  Scheduled Meds: ? (feeding supplement) PROSource Plus  30 mL Oral BID BM  ? amoxicillin  500 mg Oral Q24H  ? apixaban  5 mg Oral BID  ? atorvastatin  20 mg Oral QHS  ? calcitRIOL  0.25 mcg Oral Daily  ? Chlorhexidine Gluconate Cloth  6 each Topical Q0600  ? [START ON 02/01/2022] darbepoetin (ARANESP) injection - DIALYSIS  100 mcg Intravenous Q Thu-HD  ? docusate sodium  100 mg Oral BID  ? doxycycline  100 mg Oral Q12H  ? furosemide  80 mg Oral Once per day on Sun Mon Wed Fri  ? gabapentin  200 mg Oral TID  ? insulin aspart  0-6 Units Subcutaneous TID WC  ? insulin aspart protamine- aspart  10 Units Subcutaneous BID WC  ? lidocaine  1 patch Transdermal Q24H  ? metoprolol tartrate  25 mg Oral BID  ? multivitamin with minerals  1 tablet Oral Once per day on Mon Thu  ? pantoprazole  40 mg Oral Daily  ? sodium chloride flush  3 mL Intravenous Q12H  ? ? ? LOS: 16 days  ? ?Cherene Altes, MD ?[facility] ?Office  [phone] ?Pager - Text Page per Shea Evans ? ?If 7PM-7AM, please contact night-coverage per Amion ?01/29/2022, 8:28 AM ? ? ? ?

## 2022-01-29 NOTE — Progress Notes (Signed)
PT Cancellation Note ? ?Patient Details ?Name: RAWLINS STUARD ?MRN: [id number] ?DOB: [birth year] ? ? ?Cancelled Treatment:    Reason Eval/Treat Not Completed: Other (comment);Pain limiting ability to participate.  Shared with nursing that pt needs meds if available. ? ? ?Ramond Dial ?01/29/2022, 4:43 PM ? ?Mee Hives, PT PhD ?Acute Rehab Dept. Number: [facility] [phone] and [place] [phone] ? ?

## 2022-01-29 NOTE — Progress Notes (Signed)
?[facility] ?Progress Note  ? ?Subjective: Seen in room. No C/Os.    ? ?Objective ?Vitals:  ? 01/28/22 1724 01/28/22 2108 01/29/22 0534 01/29/22 0912  ?BP: 140/84 113/62 109/69 120/68  ?Pulse: (!) 105 99 94 88  ?Resp: '18 17 17 17  '$ ?Temp: 98.1 ?F (36.7 ?C) 98 ?F (36.7 ?C) 98.2 ?F (36.8 ?C) 98.3 ?F (36.8 ?C)  ?TempSrc: Oral Oral Oral   ?SpO2: 99% 92% 98% 99%  ?Weight:      ?Height:      ? ?Physical Exam ?General: Chronically ill appearing male in NAD.  ?Heart: S1,S2 RRR. No M/R/G ?Lungs: CTAB ?Abdomen: obese, NT. NABS ?Extremities:BLE wrapped in una boots.  Truly cannot assess edema. R BKA, L toe amps.  ?Dialysis Access: R AVF +T/B ? ?Additional Objective ?Labs: ?Basic Metabolic Panel: ?Recent Labs  ?Lab 01/25/22 ?0816 01/26/22 ?1112 01/29/22 ?0602  ?NA 136 136 130*  ?K 3.7 4.2 5.5*  ?CL 98 99 100  ?CO2 25 25 21*  ?GLUCOSE 116* 149* 122*  ?BUN 27* 23 33*  ?CREATININE 2.81* 2.69* 3.45*  ?CALCIUM 8.3* 8.3* 8.3*  ?PHOS 4.6  --  4.6  ? ?Liver Function Tests: ?Recent Labs  ?Lab 01/25/22 ?0816 01/29/22 ?0602  ?ALBUMIN 2.2* 2.0*  ? ?No results for input(s): LIPASE, AMYLASE in the last 168 hours. ?CBC: ?Recent Labs  ?Lab 01/23/22 ?0357 01/24/22 ?0218 01/25/22 ?0350 01/26/22 ?1112 01/29/22 ?0602  ?WBC 10.7* 8.5 9.7 10.1 9.9  ?NEUTROABS 7.9* 5.6  --   --   --   ?HGB 10.2* 9.7* 9.6* 9.4* 10.1*  ?HCT 33.1* 32.0* 31.3* 31.8* 33.4*  ?MCV 96.8 99.4 98.1 100.6* 96.8  ?PLT 128* 133* 157 145* PLATELET CLUMPS NOTED ON SMEAR, UNABLE TO ESTIMATE  ? ?Blood Culture ?   ?Component Value Date/Time  ? SDES WOUND 01/19/2022 1452  ? SPECREQUEST L3/L4 [place] ASPIRATION 01/19/2022 1452  ? CULT  01/19/2022 1452  ?  No growth aerobically or anaerobically. ?Performed at [facility], [place] [address] [place], [place] [address] ?  ? REPTSTATUS 01/24/2022 FINAL 01/19/2022 1452  ? ? ?Cardiac Enzymes: ?No results for input(s): CKTOTAL, CKMB, CKMBINDEX, TROPONINI in the last 168 hours. ?CBG: ?Recent Labs  ?Lab 01/28/22 ?1122  01/28/22 ?1606 01/28/22 ?2111 01/29/22 ?0730 01/29/22 ?1143  ?GLUCAP 249* 183* 102* 146* 134*  ? ?Iron Studies: No results for input(s): IRON, TIBC, TRANSFERRIN, FERRITIN in the last 72 hours. ?'@lablastinr3'$ @ ?Studies/Results: ?No results found. ?Medications: ? sodium chloride    ? sodium chloride    ? ? (feeding supplement) PROSource Plus  30 mL Oral BID BM  ? amoxicillin  500 mg Oral Q24H  ? [START ON 02/01/2022] apixaban  5 mg Oral BID  ? atorvastatin  20 mg Oral QHS  ? calcitRIOL  0.25 mcg Oral Daily  ? Chlorhexidine Gluconate Cloth  6 each Topical Q0600  ? [START ON 02/01/2022] darbepoetin (ARANESP) injection - DIALYSIS  100 mcg Intravenous Q Thu-HD  ? docusate sodium  100 mg Oral BID  ? doxycycline  100 mg Oral Q12H  ? furosemide  80 mg Oral Once per day on Sun Mon Wed Fri  ? gabapentin  200 mg Oral TID  ? insulin aspart  0-6 Units Subcutaneous TID WC  ? insulin aspart protamine- aspart  10 Units Subcutaneous BID WC  ? lidocaine  1 patch Transdermal Q24H  ? metoprolol tartrate  25 mg Oral BID  ? multivitamin with minerals  1 tablet Oral Once per day on Mon Thu  ?  pantoprazole  40 mg Oral Daily  ? sodium chloride flush  3 mL Intravenous Q12H  ? ?Dialysis Orders: ?TTS - GOC ?4hrs55mn, BFR 400, DFR 800,  EDW 94kg, 3K/ 2.5Ca ?-No Heparin ?-Mircera 200 mcg IV q 2wks - last 01/11/22 ?-Calcitriol 0.228m PO TIW ? ? ?Assessment/Plan: ?1 Lumbar back pain/Radiculopathy: Followed by Spine Surgery Dr. CrSaintclair HalstedMR Lumbar Spine completed: showed fractures L2-L3 with fluid-? Trauma vs. Infection. Neurosurgeon canceled surgery secondary to high risk. L2-3 disc aspiration 01/19/2022 culture negative.Continue pain management and Pt/OT. Eventually as outpt either will need PMD or pain clinic to manage.  ?2. PVD with Dry gangrene: Plan for R AKA on Wednesday 3/22, continue to observe LLE.  ?3. ESRD - on HD TTS. Plan for HD 3/21 ?4. Hypertension/volume  -BP stable this a.m. Volume is stable. Very much under OP EDW if wt is accurate.  Lower EDW on discharge. UF as tolerated.  ?5. Anemia of CKD - Hgb 10.1. 60 mcg Aranesp given 3/16./2023. Follow HGB. ? 6. Secondary Hyperparathyroidism - PO4 at goal (4.6 on 3/16). Continue VDRA. ?7. Nutrition - Renal diet on fluid restriction. Albumin very low. On protein supps, renal vitamins  ?8. DMT2-per primary ?9. 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 ABX. Follows with cards. ? ?RiJimmye NormanBrown NP-C ?01/29/2022, 2:45 PM  ?[facility] ?[phone] ? ?  ? ?

## 2022-01-30 DIAGNOSIS — M5416 Radiculopathy, lumbar region: Secondary | ICD-10-CM | POA: Diagnosis not present

## 2022-01-30 LAB — RENAL FUNCTION PANEL
Albumin: 2.1 g/dL — ABNORMAL LOW (ref 3.5–5.0)
Anion gap: 10 (ref 5–15)
BUN: 46 mg/dL — ABNORMAL HIGH (ref 8–23)
CO2: 26 mmol/L (ref 22–32)
Calcium: 8.8 mg/dL — ABNORMAL LOW (ref 8.9–10.3)
Chloride: 99 mmol/L (ref 98–111)
Creatinine, Ser: 3.97 mg/dL — ABNORMAL HIGH (ref 0.61–1.24)
GFR, Estimated: 16 mL/min — ABNORMAL LOW (ref >60)
Glucose, Bld: 84 mg/dL (ref 70–99)
Phosphorus: 5.3 mg/dL — ABNORMAL HIGH (ref 2.5–4.6)
Potassium: 4.9 mmol/L (ref 3.5–5.1)
Sodium: 135 mmol/L (ref 135–145)

## 2022-01-30 LAB — CBC
HCT: 31.6 % — ABNORMAL LOW (ref 39.0–52.0)
Hemoglobin: 9.7 g/dL — ABNORMAL LOW (ref 13.0–17.0)
MCH: 29.9 pg (ref 26.0–34.0)
MCHC: 30.7 g/dL (ref 30.0–36.0)
MCV: 97.5 fL (ref 80.0–100.0)
Platelets: 139 10*3/uL — ABNORMAL LOW (ref 150–400)
RBC: 3.24 MIL/uL — ABNORMAL LOW (ref 4.22–5.81)
RDW: 17.1 % — ABNORMAL HIGH (ref 11.5–15.5)
WBC: 9.3 10*3/uL (ref 4.0–10.5)
nRBC: 0 % (ref 0.0–0.2)

## 2022-01-30 LAB — GLUCOSE, CAPILLARY
Glucose-Capillary: 84 mg/dL (ref 70–99)
Glucose-Capillary: 250 mg/dL — ABNORMAL HIGH (ref 70–99)
Glucose-Capillary: 169 mg/dL — ABNORMAL HIGH (ref 70–99)

## 2022-01-30 MED ORDER — GABAPENTIN 300 MG PO CAPS
300.0000 mg | ORAL_CAPSULE | Freq: Three times a day (TID) | ORAL | Status: DC
  Administered 2022-01-30 – 2022-02-01 (×6): 300 mg via ORAL
  Filled 2022-01-30 (×6): qty 1

## 2022-01-30 NOTE — Progress Notes (Signed)
? Alexander [name]  ZOX:[id number] DOB: 25-Jun-1961 DOA: 01/13/2022 ?PCP: [name], MD   ? ?Brief Narrative:  ?61 year old with a history of ESRD on HD TTS, HTN, HLD, PVD status post right BKA and left midfoot amputation, A-fib on chronic Eliquis, CAD, DM2, and pacemaker placement who presented to the ED with intractable low back pain over several days with lower extremity radiation and radiculopathy. ? ?The patient underwent MRI with aspiration of a disc space 3/10.  Initially there was a plan for the patient to go to surgery for decompression of an area of spinal stenosis, but ultimately it was felt that his risk for surgery was too high and the decision was made to cancel surgery. ? ?Consultants:  ?Cardiology -Dr. [name] ?Neurosurgery ?Nephrology ?IR ?[name] ? ?Code Status: FULL CODE ? ?DVT prophylaxis: ?Eliquis  ? ?Interim Hx: ?Afebrile.  Some mild hypotension with systolics 84 this morning.  Vitals otherwise stable.  CBG controlled.  Electrolytes balanced.  Hemoglobin stable.  The patient is seen in hemodialysis.  He reports ongoing low back pain but appears comfortable on exam.  He denies any new complaints. ? ?Assessment & Plan: ? ?Lumbar back pain - L2-3 severe spinal stenosis - radiculopathy of left lower extremity ?CT noted moderate neuroforaminal stenosis at L2-3 and L5-S1 - MRI lumbar spine noted subacute fractures L2-L3 disc and severe multifactorial spinal stenosis at L2-3 - followed by Dr. Saintclair Halsted with Neurosurgery - surgical intervention canceled due to high risk - IR completed disc aspiration 3/10 with no evidence of infection - has completed a course of IV Decadron - goal at this time is pain management - PT/OT recommend SNF but patient refused - adjusted Neurontin for neuropathic pain 3/16 -adjusted narcotic again for low back pain 3/16 - pain has been much better controlled for the past few days -continue with attempts to fine-tune management -encouraged patient that ambulation necessary ? ?PVD  with dry gangrene of right transtibial amputation site -left transmetatarsal wound poorly healing ?Followed in office by [name] - last seen as outpt 01/10/22 for weeping from B LE wounds - placed in compression wrapping - weeping persists here in hospital - [name] saw 01/16/22 w/ plan to continue trial w/ compression wrapping - was seen in f/u again 3/16 and initially suggested B LE AKA - arterial studies however make it clear AKA not possible on L due to arterial graft - plan presently is for R AKA 3/22 and ongoing observation of the L with wound care ? ?ESRD on HD TTS ?Nephrology attending to dialysis ? ?Paroxysmal atrial fibrillation ?Continue Lopressor and Eliquis - heart rate controlled ? ?HTN ?Blood pressure well controlled ? ?Hyperlipidemia ?Continue statin ? ?DM 2 ?CBG presently at goal -continue to follow trend ? ?Chronic pacemaker infection - Severe tricuspid regurgitation with flail tricuspid valve leaflet - possible vegetation anterior mitral leaflet ?Followed by ID in outpatient setting - continue suppressive therapy with doxycycline and amoxicillin indefinitely ? ?Pressure Injury 01/14/22 Ischial tuberosity Right Stage 3 -  Full thickness tissue loss. Subcutaneous fat may be visible but bone, tendon or muscle are NOT exposed. (Active)  ?01/14/22 0321  ?Location: Ischial tuberosity  ?Location Orientation: Right  ?Staging: Stage 3 -  Full thickness tissue loss. Subcutaneous fat may be visible but bone, tendon or muscle are NOT exposed.  ?Wound Description (Comments):   ?Present on Admission: Yes  ?   ?Pressure Injury 01/14/22 Buttocks Right Stage 2 -  Partial thickness loss of dermis presenting as a  shallow open injury with a red, pink wound bed without slough. (Active)  ?01/14/22 0322  ?Location: Buttocks  ?Location Orientation: Right  ?Staging: Stage 2 -  Partial thickness loss of dermis presenting as a shallow open injury with a red, pink wound bed without slough.  ?Wound Description (Comments):    ?Present on Admission: Yes  ?   ?Pressure Injury 01/14/22 Buttocks Posterior;Proximal;Right Stage 2 -  Partial thickness loss of dermis presenting as a shallow open injury with a red, pink wound bed without slough. (Active)  ?01/14/22 0324  ?Location: Buttocks  ?Location Orientation: Posterior;Proximal;Right  ?Staging: Stage 2 -  Partial thickness loss of dermis presenting as a shallow open injury with a red, pink wound bed without slough.  ?Wound Description (Comments):   ?Present on Admission: Yes  ? ? ?Family Communication: No family present at time of exam ?Disposition: SNF recommended but patient refusing -also refusing HH -now awaiting planned R LE AKA ? ?Objective: ?Blood pressure (!) 84/79, pulse 86, temperature (!) 97.5 ?F (36.4 ?C), temperature source Temporal, resp. rate 18, height 6' (1.829 m), weight 89 kg, SpO2 97 %. ? ?Intake/Output Summary (Last 24 hours) at 01/30/2022 0902 ?Last data filed at 01/30/2022 0800 ?Gross per 24 hour  ?Intake 660 ml  ?Output 150 ml  ?Net 510 ml  ? ? ?Filed Weights  ? 01/26/22 0459 01/27/22 2049 01/30/22 0735  ?Weight: 88.2 kg 88.7 kg 89 kg  ? ? ?Examination: ?General: No acute respiratory distress -alert and conversant ?Lungs: Clear to auscultation bilaterally -no wheezing ?Cardiovascular: Regular rate without murmur ?Abdomen: nondistended, soft, bowel sounds positive, no mass ?Extremities: Trace edema bilaterally - wounds dressed ? ?CBC: ?Recent Labs  ?Lab 01/24/22 ?0218 01/25/22 ?2297 01/26/22 ?1112 01/29/22 ?0602 01/30/22 ?0708  ?WBC 8.5   < > 10.1 9.9 9.3  ?NEUTROABS 5.6  --   --   --   --   ?HGB 9.7*   < > 9.4* 10.1* 9.7*  ?HCT 32.0*   < > 31.8* 33.4* 31.6*  ?MCV 99.4   < > 100.6* 96.8 97.5  ?PLT 133*   < > 145* PLATELET CLUMPS NOTED ON SMEAR, UNABLE TO ESTIMATE 139*  ? < > = values in this interval not displayed.  ? ? ?Basic Metabolic Panel: ?Recent Labs  ?Lab 01/25/22 ?0816 01/26/22 ?1112 01/29/22 ?0602 01/30/22 ?0708  ?NA 136 136 130* 135  ?K 3.7 4.2 5.5* 4.9  ?CL  98 99 100 99  ?CO2 25 25 21* 26  ?GLUCOSE 116* 149* 122* 84  ?BUN 27* 23 33* 46*  ?CREATININE 2.81* 2.69* 3.45* 3.97*  ?CALCIUM 8.3* 8.3* 8.3* 8.8*  ?PHOS 4.6  --  4.6 5.3*  ? ? ?GFR: ?Estimated Creatinine Clearance: 20.9 mL/min (A) (by C-G formula based on SCr of 3.97 mg/dL (H)). ? ?Liver Function Tests: ?Recent Labs  ?Lab 01/25/22 ?0816 01/29/22 ?0602 01/30/22 ?0708  ?ALBUMIN 2.2* 2.0* 2.1*  ? ? ? ?HbA1C: ?Hgb A1c MFr Bld  ?Date/Time Value Ref Range Status  ?10/29/2021 08:52 AM 6.8 (H) 4.8 - 5.6 % Final  ?  Comment:  ?  (NOTE) ?Pre diabetes:          5.7%-6.4% ? ?Diabetes:              >6.4% ? ?Glycemic control for   <7.0% ?adults with diabetes ?  ?05/23/2020 07:55 PM 5.5 4.8 - 5.6 % Final  ?  Comment:  ?  (NOTE) ?Pre diabetes:          5.7%-6.4% ? ?  Diabetes:              >6.4% ? ?Glycemic control for   <7.0% ?adults with diabetes ?  ? ? ?CBG: ?Recent Labs  ?Lab 01/28/22 ?2111 01/29/22 ?0730 01/29/22 ?1143 01/29/22 ?1634 01/29/22 ?2012  ?GLUCAP 102* 146* 134* 132* 115*  ? ? ? ?Scheduled Meds: ? (feeding supplement) PROSource Plus  30 mL Oral BID BM  ? amoxicillin  500 mg Oral Q24H  ? [START ON 02/01/2022] apixaban  5 mg Oral BID  ? atorvastatin  20 mg Oral QHS  ? calcitRIOL  0.25 mcg Oral Daily  ? Chlorhexidine Gluconate Cloth  6 each Topical Q0600  ? [START ON 02/01/2022] darbepoetin (ARANESP) injection - DIALYSIS  100 mcg Intravenous Q Thu-HD  ? docusate sodium  100 mg Oral BID  ? doxycycline  100 mg Oral Q12H  ? furosemide  80 mg Oral Once per day on Sun Mon Wed Fri  ? gabapentin  200 mg Oral TID  ? insulin aspart  0-6 Units Subcutaneous TID WC  ? insulin aspart protamine- aspart  10 Units Subcutaneous BID WC  ? lidocaine  1 patch Transdermal Q24H  ? metoprolol tartrate  25 mg Oral BID  ? multivitamin with minerals  1 tablet Oral Once per day on Mon Thu  ? pantoprazole  40 mg Oral Daily  ? sodium chloride flush  3 mL Intravenous Q12H  ? ? ? LOS: 17 days  ? ?Cherene Altes, MD ?[facility] ?Office   [phone] ?Pager - Text Page per Shea Evans ? ?If 7PM-7AM, please contact night-coverage per Amion ?01/30/2022, 9:02 AM ? ? ? ?

## 2022-01-30 NOTE — Progress Notes (Signed)
?[facility] ?Progress Note  ? ?Subjective: Seen on HD. No C/Os. Minimal UF today. To OR for R AKA 01/31/2022 ? ?Objective ?Vitals:  ? 01/30/22 0830 01/30/22 0845 01/30/22 0900 01/30/22 0930  ?BP: 98/67 (!) 84/79 103/67 (!) 104/56  ?Pulse: 76 86 97 85  ?Resp:      ?Temp:      ?TempSrc:      ?SpO2:      ?Weight:      ?Height:      ? ?Physical Exam ?General: Chronically ill appearing male in NAD.  ?Heart: S1,S2 RRR. No M/R/G ?Lungs: CTAB ?Abdomen: obese, NT. NABS ?Extremities:BLE wrapped in una boots.  Truly cannot assess edema. R BKA, L toe amps.  ?Dialysis Access: R AVF cannulated at present ? ? ? ?Additional Objective ?Labs: ?Basic Metabolic Panel: ?Recent Labs  ?Lab 01/25/22 ?0816 01/26/22 ?1112 01/29/22 ?0602 01/30/22 ?0708  ?NA 136 136 130* 135  ?K 3.7 4.2 5.5* 4.9  ?CL 98 99 100 99  ?CO2 25 25 21* 26  ?GLUCOSE 116* 149* 122* 84  ?BUN 27* 23 33* 46*  ?CREATININE 2.81* 2.69* 3.45* 3.97*  ?CALCIUM 8.3* 8.3* 8.3* 8.8*  ?PHOS 4.6  --  4.6 5.3*  ? ?Liver Function Tests: ?Recent Labs  ?Lab 01/25/22 ?0816 01/29/22 ?0602 01/30/22 ?0708  ?ALBUMIN 2.2* 2.0* 2.1*  ? ?No results for input(s): LIPASE, AMYLASE in the last 168 hours. ?CBC: ?Recent Labs  ?Lab 01/24/22 ?0218 01/25/22 ?1941 01/26/22 ?1112 01/29/22 ?0602 01/30/22 ?0708  ?WBC 8.5 9.7 10.1 9.9 9.3  ?NEUTROABS 5.6  --   --   --   --   ?HGB 9.7* 9.6* 9.4* 10.1* 9.7*  ?HCT 32.0* 31.3* 31.8* 33.4* 31.6*  ?MCV 99.4 98.1 100.6* 96.8 97.5  ?PLT 133* 157 145* PLATELET CLUMPS NOTED ON SMEAR, UNABLE TO ESTIMATE 139*  ? ?Blood Culture ?   ?Component Value Date/Time  ? SDES WOUND 01/19/2022 1452  ? SPECREQUEST L3/L4 [place] ASPIRATION 01/19/2022 1452  ? CULT  01/19/2022 1452  ?  No growth aerobically or anaerobically. ?Performed at [facility], [place] [address], [place], [place] [address] ?  ? REPTSTATUS 01/24/2022 FINAL 01/19/2022 1452  ? ? ?Cardiac Enzymes: ?No results for input(s): CKTOTAL, CKMB, CKMBINDEX, TROPONINI in the last 168 hours. ?CBG: ?Recent  Labs  ?Lab 01/28/22 ?2111 01/29/22 ?0730 01/29/22 ?1143 01/29/22 ?1634 01/29/22 ?2012  ?GLUCAP 102* 146* 134* 132* 115*  ? ?Iron Studies: No results for input(s): IRON, TIBC, TRANSFERRIN, FERRITIN in the last 72 hours. ?'@lablastinr3'$ @ ?Studies/Results: ?No results found. ?Medications: ? sodium chloride    ? sodium chloride    ? sodium chloride    ? sodium chloride    ? ? (feeding supplement) PROSource Plus  30 mL Oral BID BM  ? amoxicillin  500 mg Oral Q24H  ? [START ON 02/01/2022] apixaban  5 mg Oral BID  ? atorvastatin  20 mg Oral QHS  ? calcitRIOL  0.25 mcg Oral Daily  ? Chlorhexidine Gluconate Cloth  6 each Topical Q0600  ? [START ON 02/01/2022] darbepoetin (ARANESP) injection - DIALYSIS  100 mcg Intravenous Q Thu-HD  ? docusate sodium  100 mg Oral BID  ? doxycycline  100 mg Oral Q12H  ? furosemide  80 mg Oral Once per day on Sun Mon Wed Fri  ? gabapentin  200 mg Oral TID  ? insulin aspart  0-6 Units Subcutaneous TID WC  ? insulin aspart protamine- aspart  10 Units Subcutaneous BID WC  ? lidocaine  1  patch Transdermal Q24H  ? metoprolol tartrate  25 mg Oral BID  ? multivitamin with minerals  1 tablet Oral Once per day on Mon Thu  ? pantoprazole  40 mg Oral Daily  ? sodium chloride flush  3 mL Intravenous Q12H  ? ? ? ?Dialysis Orders: ?TTS - GOC ?4hrs62mn, BFR 400, DFR 800,  EDW 94kg, 3K/ 2.5Ca ?-No Heparin ?-Mircera 200 mcg IV q 2wks - last 01/11/22 ?-Calcitriol 0.210m PO TIW ?  ?  ?Assessment/Plan: ?1 Lumbar back pain/Radiculopathy: Followed by Spine Surgery Dr. CrSaintclair HalstedMR Lumbar Spine completed: showed fractures L2-L3 with fluid-? Trauma vs. Infection. Neurosurgeon canceled surgery secondary to high risk. L2-3 disc aspiration 01/19/2022 culture negative.Continue pain management and Pt/OT. Eventually as outpt either will need PMD or pain clinic to manage.  ?2. PVD with Dry gangrene: Plan for R AKA on Wednesday 01/31/2022, continue to observe LLE.  ?3. ESRD - on HD TTS. HD today on schedule. Next HD 02/01/2022.  ?4.  Hypertension/volume  -BP stable this a.m. Volume is stable. Very much under OP EDW if wt is accurate. Lower EDW on discharge. UF as tolerated.  ?5. Anemia of CKD - Hgb 9.7. 60 mcg Aranesp given 3/16./2023. Follow HGB. ? 6. Secondary Hyperparathyroidism - PO4 at goal. Continue VDRA. ?7. Nutrition - Renal diet on fluid restriction. Albumin very low. On protein supps, renal vitamins  ?8. DMT2-per primary ?9. 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 ABX. Follows with cards. ? ?RiJimmye NormanBrown NP-C ?01/30/2022, 9:57 AM  ?[facility] ?[phone] ? ?  ? ?

## 2022-01-30 NOTE — Progress Notes (Signed)
PT Cancellation Note ? ?Patient Details ?Name: RIYANSH GERSTNER ?MRN: [id number] ?DOB: 1961-05-10 ? ? ?Cancelled Treatment:    Reason Eval/Treat Not Completed: Patient at procedure or test/unavailable ? ?Currently in HD; ? ?Will follow up later today as time allows;  ?Otherwise, will follow up for PT tomorrow;  ? ?Thank you,  ?Roney Marion, PT  ?Acute Rehabilitation Services ?Pager [phone] ?Office [phone] ? ? ? ?Colletta [place] ?01/30/2022, 8:16 AM ?

## 2022-01-31 ENCOUNTER — Other Ambulatory Visit: Payer: Self-pay

## 2022-01-31 ENCOUNTER — Encounter (HOSPITAL_COMMUNITY): Payer: Self-pay | Admitting: Family Medicine

## 2022-01-31 ENCOUNTER — Ambulatory Visit: Payer: Medicare Other | Admitting: Family

## 2022-01-31 ENCOUNTER — Other Ambulatory Visit: Payer: Medicare Other

## 2022-01-31 ENCOUNTER — Inpatient Hospital Stay (HOSPITAL_COMMUNITY): Payer: Medicare Other | Admitting: Certified Registered Nurse Anesthetist

## 2022-01-31 ENCOUNTER — Encounter (HOSPITAL_COMMUNITY)
Admission: EM | Disposition: A | Discharge status: Skilled Nursing Facility | Payer: Self-pay | Source: Home / Self Care | Attending: Internal Medicine

## 2022-01-31 DIAGNOSIS — Z794 Long term (current) use of insulin: Secondary | ICD-10-CM

## 2022-01-31 DIAGNOSIS — Z89511 Acquired absence of right leg below knee: Secondary | ICD-10-CM | POA: Diagnosis not present

## 2022-01-31 DIAGNOSIS — T8781 Dehiscence of amputation stump: Secondary | ICD-10-CM | POA: Diagnosis not present

## 2022-01-31 DIAGNOSIS — M5416 Radiculopathy, lumbar region: Secondary | ICD-10-CM | POA: Diagnosis not present

## 2022-01-31 DIAGNOSIS — T8753 Necrosis of amputation stump, right lower extremity: Secondary | ICD-10-CM

## 2022-01-31 DIAGNOSIS — I4891 Unspecified atrial fibrillation: Secondary | ICD-10-CM

## 2022-01-31 DIAGNOSIS — E1152 Type 2 diabetes mellitus with diabetic peripheral angiopathy with gangrene: Secondary | ICD-10-CM

## 2022-01-31 HISTORY — PX: AMPUTATION: SHX166

## 2022-01-31 LAB — POCT I-STAT, CHEM 8
BUN: 31 mg/dL — ABNORMAL HIGH (ref 8–23)
Calcium, Ion: 1.26 mmol/L (ref 1.15–1.40)
Chloride: 96 mmol/L — ABNORMAL LOW (ref 98–111)
Creatinine, Ser: 2.7 mg/dL — ABNORMAL HIGH (ref 0.61–1.24)
Glucose, Bld: 84 mg/dL (ref 70–99)
HCT: 34 % — ABNORMAL LOW (ref 39.0–52.0)
Hemoglobin: 11.6 g/dL — ABNORMAL LOW (ref 13.0–17.0)
Potassium: 4 mmol/L (ref 3.5–5.1)
Sodium: 135 mmol/L (ref 135–145)
TCO2: 29 mmol/L (ref 22–32)

## 2022-01-31 LAB — GLUCOSE, CAPILLARY
Glucose-Capillary: 210 mg/dL — ABNORMAL HIGH (ref 70–99)
Glucose-Capillary: 103 mg/dL — ABNORMAL HIGH (ref 70–99)
Glucose-Capillary: 87 mg/dL (ref 70–99)
Glucose-Capillary: 139 mg/dL — ABNORMAL HIGH (ref 70–99)
Glucose-Capillary: 187 mg/dL — ABNORMAL HIGH (ref 70–99)

## 2022-01-31 SURGERY — AMPUTATION, ABOVE KNEE
Anesthesia: General | Site: Knee | Laterality: Right

## 2022-01-31 MED ORDER — PHENYLEPHRINE 40 MCG/ML (10ML) SYRINGE FOR IV PUSH (FOR BLOOD PRESSURE SUPPORT)
PREFILLED_SYRINGE | INTRAVENOUS | Status: DC | PRN
  Administered 2022-01-31: 120 ug via INTRAVENOUS
  Administered 2022-01-31 (×2): 80 ug via INTRAVENOUS
  Administered 2022-01-31: 200 ug via INTRAVENOUS

## 2022-01-31 MED ORDER — SODIUM CHLORIDE 0.9 % IV SOLN: INTRAVENOUS | Status: DC | PRN

## 2022-01-31 MED ORDER — LABETALOL HCL 5 MG/ML IV SOLN: 10.0000 mg | INTRAVENOUS | Status: DC | PRN

## 2022-01-31 MED ORDER — MIDAZOLAM HCL 2 MG/2ML IJ SOLN: 2.0000 mg | Freq: Once | INTRAMUSCULAR | Status: AC

## 2022-01-31 MED ORDER — ALUM & MAG HYDROXIDE-SIMETH 200-200-20 MG/5ML PO SUSP: 15.0000 mL | ORAL | Status: DC | PRN

## 2022-01-31 MED ORDER — HYDROMORPHONE HCL 1 MG/ML IJ SOLN
0.2500 mg | INTRAMUSCULAR | Status: DC | PRN
  Administered 2022-01-31 (×2): 0.5 mg via INTRAVENOUS

## 2022-01-31 MED ORDER — EPHEDRINE SULFATE-NACL 50-0.9 MG/10ML-% IV SOSY
PREFILLED_SYRINGE | INTRAVENOUS | Status: DC | PRN
  Administered 2022-01-31 (×2): 5 mg via INTRAVENOUS

## 2022-01-31 MED ORDER — POTASSIUM CHLORIDE CRYS ER 20 MEQ PO TBCR: 20.0000 meq | EXTENDED_RELEASE_TABLET | Freq: Every day | ORAL | Status: DC | PRN

## 2022-01-31 MED ORDER — 0.9 % SODIUM CHLORIDE (POUR BTL) OPTIME
TOPICAL | Status: DC | PRN
  Administered 2022-01-31: 1000 mL

## 2022-01-31 MED ORDER — OXYCODONE HCL 5 MG PO TABS: 5.0000 mg | ORAL_TABLET | Freq: Once | ORAL | Status: DC | PRN

## 2022-01-31 MED ORDER — PHENYLEPHRINE 40 MCG/ML (10ML) SYRINGE FOR IV PUSH (FOR BLOOD PRESSURE SUPPORT)
PREFILLED_SYRINGE | INTRAVENOUS | Status: AC
  Filled 2022-01-31: qty 20

## 2022-01-31 MED ORDER — INSULIN ASPART 100 UNIT/ML IJ SOLN: 0.0000 [IU] | INTRAMUSCULAR | Status: DC | PRN

## 2022-01-31 MED ORDER — DEXAMETHASONE SODIUM PHOSPHATE 10 MG/ML IJ SOLN
INTRAMUSCULAR | Status: AC
  Filled 2022-01-31: qty 1

## 2022-01-31 MED ORDER — MAGNESIUM CITRATE PO SOLN: 1.0000 | Freq: Once | ORAL | Status: DC | PRN

## 2022-01-31 MED ORDER — CEFAZOLIN SODIUM-DEXTROSE 2-4 GM/100ML-% IV SOLN
INTRAVENOUS | Status: AC
  Filled 2022-01-31: qty 100

## 2022-01-31 MED ORDER — DOCUSATE SODIUM 100 MG PO CAPS
100.0000 mg | ORAL_CAPSULE | Freq: Every day | ORAL | Status: DC
  Administered 2022-02-01 – 2022-02-14 (×12): 100 mg via ORAL
  Filled 2022-01-31 (×13): qty 1

## 2022-01-31 MED ORDER — ONDANSETRON HCL 4 MG/2ML IJ SOLN
INTRAMUSCULAR | Status: AC
  Filled 2022-01-31: qty 2

## 2022-01-31 MED ORDER — LIDOCAINE HCL (PF) 1 % IJ SOLN: 5.0000 mL | INTRAMUSCULAR | Status: DC | PRN

## 2022-01-31 MED ORDER — FENTANYL CITRATE (PF) 100 MCG/2ML IJ SOLN: 100.0000 ug | Freq: Once | INTRAMUSCULAR | Status: AC

## 2022-01-31 MED ORDER — MIDAZOLAM HCL 2 MG/2ML IJ SOLN
INTRAMUSCULAR | Status: AC
  Administered 2022-01-31: 2 mg via INTRAVENOUS
  Filled 2022-01-31: qty 2

## 2022-01-31 MED ORDER — FENTANYL CITRATE (PF) 250 MCG/5ML IJ SOLN
INTRAMUSCULAR | Status: AC
Start: 2022-01-31 — End: ?
  Filled 2022-01-31: qty 5

## 2022-01-31 MED ORDER — HYDRALAZINE HCL 20 MG/ML IJ SOLN: 5.0000 mg | INTRAMUSCULAR | Status: DC | PRN

## 2022-01-31 MED ORDER — OXYCODONE HCL 5 MG PO TABS
10.0000 mg | ORAL_TABLET | ORAL | Status: DC | PRN
  Administered 2022-02-01: 15 mg via ORAL
  Administered 2022-02-03: 10 mg via ORAL
  Administered 2022-02-03 – 2022-02-07 (×6): 15 mg via ORAL
  Filled 2022-01-31 (×8): qty 3

## 2022-01-31 MED ORDER — BUPIVACAINE HCL (PF) 0.5 % IJ SOLN
INTRAMUSCULAR | Status: DC | PRN
  Administered 2022-01-31 (×2): 15 mL via PERINEURAL

## 2022-01-31 MED ORDER — BUPIVACAINE LIPOSOME 1.3 % IJ SUSP
INTRAMUSCULAR | Status: DC | PRN
  Administered 2022-01-31: 10 mL

## 2022-01-31 MED ORDER — OXYCODONE HCL 5 MG/5ML PO SOLN: 5.0000 mg | Freq: Once | ORAL | Status: DC | PRN

## 2022-01-31 MED ORDER — BISACODYL 5 MG PO TBEC
5.0000 mg | DELAYED_RELEASE_TABLET | Freq: Every day | ORAL | Status: DC | PRN
Start: 2022-01-31 — End: 2022-02-15

## 2022-01-31 MED ORDER — LIDOCAINE 2% (20 MG/ML) 5 ML SYRINGE
INTRAMUSCULAR | Status: DC | PRN
  Administered 2022-01-31: 100 mg via INTRAVENOUS

## 2022-01-31 MED ORDER — ZINC SULFATE 220 (50 ZN) MG PO CAPS
220.0000 mg | ORAL_CAPSULE | Freq: Every day | ORAL | Status: AC
  Administered 2022-01-31 – 2022-02-13 (×13): 220 mg via ORAL
  Filled 2022-01-31 (×14): qty 1

## 2022-01-31 MED ORDER — MAGNESIUM SULFATE 2 GM/50ML IV SOLN: 2.0000 g | Freq: Every day | INTRAVENOUS | Status: DC | PRN

## 2022-01-31 MED ORDER — GUAIFENESIN-DM 100-10 MG/5ML PO SYRP: 15.0000 mL | ORAL_SOLUTION | ORAL | Status: DC | PRN

## 2022-01-31 MED ORDER — ONDANSETRON HCL 4 MG/2ML IJ SOLN
4.0000 mg | Freq: Four times a day (QID) | INTRAMUSCULAR | Status: DC | PRN
  Administered 2022-02-06: 4 mg via INTRAVENOUS

## 2022-01-31 MED ORDER — CLINDAMYCIN PHOSPHATE 900 MG/50ML IV SOLN
900.0000 mg | INTRAVENOUS | Status: AC
  Administered 2022-01-31: 900 mg via INTRAVENOUS
  Filled 2022-01-31: qty 50

## 2022-01-31 MED ORDER — DEXAMETHASONE SODIUM PHOSPHATE 10 MG/ML IJ SOLN
INTRAMUSCULAR | Status: DC | PRN
  Administered 2022-01-31: 4 mg via INTRAVENOUS

## 2022-01-31 MED ORDER — POVIDONE-IODINE 10 % EX SWAB: 2.0000 "application " | Freq: Once | CUTANEOUS | Status: DC

## 2022-01-31 MED ORDER — PROPOFOL 10 MG/ML IV BOLUS
INTRAVENOUS | Status: DC | PRN
Start: 2022-01-31 — End: 2022-01-31
  Administered 2022-01-31: 120 mg via INTRAVENOUS

## 2022-01-31 MED ORDER — OXYCODONE HCL 5 MG PO TABS
5.0000 mg | ORAL_TABLET | ORAL | Status: DC | PRN
  Administered 2022-02-06: 5 mg via ORAL
  Administered 2022-02-07: 10 mg via ORAL
  Filled 2022-01-31: qty 1
  Filled 2022-01-31 (×2): qty 2

## 2022-01-31 MED ORDER — PANTOPRAZOLE SODIUM 40 MG PO TBEC
40.0000 mg | DELAYED_RELEASE_TABLET | Freq: Every day | ORAL | Status: DC
  Administered 2022-02-01 – 2022-02-14 (×13): 40 mg via ORAL
  Filled 2022-01-31 (×14): qty 1

## 2022-01-31 MED ORDER — METOPROLOL TARTRATE 5 MG/5ML IV SOLN: 2.0000 mg | INTRAVENOUS | Status: DC | PRN

## 2022-01-31 MED ORDER — CHLORHEXIDINE GLUCONATE 0.12 % MT SOLN
OROMUCOSAL | Status: AC
  Filled 2022-01-31: qty 15

## 2022-01-31 MED ORDER — POLYETHYLENE GLYCOL 3350 17 G PO PACK: 17.0000 g | PACK | Freq: Every day | ORAL | Status: DC | PRN

## 2022-01-31 MED ORDER — ASCORBIC ACID 500 MG PO TABS
1000.0000 mg | ORAL_TABLET | Freq: Every day | ORAL | Status: DC
  Administered 2022-01-31 – 2022-02-14 (×14): 1000 mg via ORAL
  Filled 2022-01-31 (×15): qty 2

## 2022-01-31 MED ORDER — JUVEN PO PACK
1.0000 | PACK | Freq: Two times a day (BID) | ORAL | Status: DC
  Administered 2022-02-04 – 2022-02-12 (×7): 1 via ORAL
  Filled 2022-01-31 (×11): qty 1

## 2022-01-31 MED ORDER — TRANEXAMIC ACID-NACL 1000-0.7 MG/100ML-% IV SOLN
1000.0000 mg | INTRAVENOUS | Status: AC
  Administered 2022-01-31: 1000 mg via INTRAVENOUS
  Filled 2022-01-31: qty 100

## 2022-01-31 MED ORDER — HYDROMORPHONE HCL 1 MG/ML IJ SOLN
INTRAMUSCULAR | Status: AC
  Filled 2022-01-31: qty 1

## 2022-01-31 MED ORDER — HYDROMORPHONE HCL 1 MG/ML IJ SOLN
0.5000 mg | INTRAMUSCULAR | Status: DC | PRN
  Administered 2022-02-02 – 2022-02-12 (×3): 1 mg via INTRAVENOUS
  Filled 2022-01-31 (×3): qty 1

## 2022-01-31 MED ORDER — SODIUM CHLORIDE 0.9 % IV SOLN: INTRAVENOUS | Status: DC

## 2022-01-31 MED ORDER — LIDOCAINE 2% (20 MG/ML) 5 ML SYRINGE
INTRAMUSCULAR | Status: AC
  Filled 2022-01-31: qty 5

## 2022-01-31 MED ORDER — FENTANYL CITRATE (PF) 100 MCG/2ML IJ SOLN
INTRAMUSCULAR | Status: AC
  Administered 2022-01-31: 100 ug via INTRAVENOUS
  Filled 2022-01-31: qty 2

## 2022-01-31 MED ORDER — CHLORHEXIDINE GLUCONATE 4 % EX LIQD: 60.0000 mL | Freq: Once | CUTANEOUS | Status: DC

## 2022-01-31 MED ORDER — PHENOL 1.4 % MT LIQD: 1.0000 | OROMUCOSAL | Status: DC | PRN

## 2022-01-31 MED ORDER — ONDANSETRON HCL 4 MG/2ML IJ SOLN: 4.0000 mg | Freq: Once | INTRAMUSCULAR | Status: DC | PRN

## 2022-01-31 MED ORDER — ONDANSETRON HCL 4 MG/2ML IJ SOLN
INTRAMUSCULAR | Status: DC | PRN
  Administered 2022-01-31: 4 mg via INTRAVENOUS

## 2022-01-31 SURGICAL SUPPLY — 40 items
BAG COUNTER SPONGE SURGICOUNT (BAG) IMPLANT
BAG SPNG CNTER NS LX DISP (BAG)
BLADE SAW RECIP 87.9 MT (BLADE) ×2 IMPLANT
BLADE SURG 21 STRL SS (BLADE) ×2 IMPLANT
BNDG COHESIVE 6X5 TAN NS LF (GAUZE/BANDAGES/DRESSINGS) ×1 IMPLANT
BNDG COHESIVE 6X5 TAN STRL LF (GAUZE/BANDAGES/DRESSINGS) ×2 IMPLANT
CANISTER WOUND CARE 500ML ATS (WOUND CARE) IMPLANT
COVER SURGICAL LIGHT HANDLE (MISCELLANEOUS) ×2 IMPLANT
CUFF TOURN SGL QUICK 34 (TOURNIQUET CUFF)
CUFF TRNQT CYL 34X4.125X (TOURNIQUET CUFF) IMPLANT
DRAPE INCISE IOBAN 66X45 STRL (DRAPES) ×4 IMPLANT
DRAPE U-SHAPE 47X51 STRL (DRAPES) ×2 IMPLANT
DRESSING PREVENA PLUS CUSTOM (GAUZE/BANDAGES/DRESSINGS) ×1 IMPLANT
DRSG PREVENA PLUS CUSTOM (GAUZE/BANDAGES/DRESSINGS) ×2
DURAPREP 26ML APPLICATOR (WOUND CARE) ×2 IMPLANT
ELECT REM PT RETURN 9FT ADLT (ELECTROSURGICAL) ×2
ELECTRODE REM PT RTRN 9FT ADLT (ELECTROSURGICAL) ×1 IMPLANT
GLOVE SURG ORTHO LTX SZ9 (GLOVE) ×2 IMPLANT
GLOVE SURG POLYISO LF SZ6.5 (GLOVE) ×2 IMPLANT
GLOVE SURG UNDER POLY LF SZ7.5 (GLOVE) ×2 IMPLANT
GLOVE SURG UNDER POLY LF SZ9 (GLOVE) ×2 IMPLANT
GOWN STRL REUS W/ TWL LRG LVL3 (GOWN DISPOSABLE) ×1 IMPLANT
GOWN STRL REUS W/ TWL XL LVL3 (GOWN DISPOSABLE) ×2 IMPLANT
GOWN STRL REUS W/TWL LRG LVL3 (GOWN DISPOSABLE) ×2
GOWN STRL REUS W/TWL XL LVL3 (GOWN DISPOSABLE) ×4
KIT BASIN OR (CUSTOM PROCEDURE TRAY) ×2 IMPLANT
KIT TURNOVER KIT B (KITS) ×2 IMPLANT
MANIFOLD NEPTUNE II (INSTRUMENTS) ×2 IMPLANT
NS IRRIG 1000ML POUR BTL (IV SOLUTION) ×2 IMPLANT
PACK ORTHO EXTREMITY (CUSTOM PROCEDURE TRAY) ×2 IMPLANT
PAD ARMBOARD 7.5X6 YLW CONV (MISCELLANEOUS) ×2 IMPLANT
PREVENA RESTOR ARTHOFORM 46X30 (CANNISTER) ×2 IMPLANT
STAPLER VISISTAT 35W (STAPLE) IMPLANT
STOCKINETTE IMPERVIOUS LG (DRAPES) IMPLANT
SUT ETHILON 2 0 PSLX (SUTURE) ×4 IMPLANT
SUT SILK 2 0 (SUTURE) ×2
SUT SILK 2-0 18XBRD TIE 12 (SUTURE) ×1 IMPLANT
TOWEL GREEN STERILE FF (TOWEL DISPOSABLE) ×2 IMPLANT
TUBE CONNECTING 20X1/4 (TUBING) ×2 IMPLANT
YANKAUER SUCT BULB TIP NO VENT (SUCTIONS) ×2 IMPLANT

## 2022-01-31 NOTE — Anesthesia Procedure Notes (Signed)
Anesthesia Regional Block: Femoral nerve block  ? ?Pre-Anesthetic Checklist: , timeout performed,  Correct Patient, Correct Site, Correct Laterality,  Correct Procedure, Correct Position, site marked,  Risks and benefits discussed,  Surgical consent,  Pre-op evaluation,  At surgeon's request and post-op pain management ? ?Laterality: Right ? ?Prep: chloraprep     ?  ?Needles:  ?Injection technique: Single-shot ? ?Needle Type: Echogenic Stimulator Needle   ? ? ?Needle Length: 10cm  ?Needle Gauge: 20  ? ? ? ?Additional Needles: ? ? ?Procedures:,,,, ultrasound used (permanent image in chart),,    ?Narrative:  ?Start time: 01/31/2022 12:22 PM ?End time: 01/31/2022 12:29 PM ?Injection made incrementally with aspirations every 5 mL. ? ?Performed by: Personally  ?Anesthesiologist: Lidia Collum, MD ? ?Additional Notes: ?Standard monitors applied. Skin prepped. Good needle visualization with ultrasound. Injection made in 5cc increments with no resistance to injection. Patient tolerated the procedure well. ? ? ? ? ? ?

## 2022-01-31 NOTE — Anesthesia Preprocedure Evaluation (Signed)
Anesthesia Evaluation  ?Patient identified by MRN, date of birth, ID band ?Patient awake ? ? ? ?Reviewed: ?Allergy & Precautions, NPO status , Patient's Chart, lab work & pertinent test results ? ?History of Anesthesia Complications ?Negative for: history of anesthetic complications ? ?Airway ?Mallampati: III ? ?TM Distance: >3 FB ?Neck ROM: Full ? ? ? Dental ? ?(+) Dental Advisory Given, Poor Dentition, Chipped, Missing ?  ?Pulmonary ?former smoker,  ?  ?Pulmonary exam normal ? ? ? ? ? ? ? Cardiovascular ?hypertension, Pt. on home beta blockers and Pt. on medications ?+ CAD, + Peripheral Vascular Disease and +CHF  ?Normal cardiovascular exam+ dysrhythmias Atrial Fibrillation + pacemaker + Valvular Problems/Murmurs  ? ? ?Echo 01/20/22: EF 30-35%, global hypokinesis, mild LVH, hyperdynamic RV, mod pulm HTN (PASP 49), mild MR, vegetation of the TV leaflet and flail septal leaflet, severe TR, normal AV ? ?  ?Neuro/Psych ?negative neurological ROS ? negative psych ROS  ? GI/Hepatic ?GERD  ,(+)  ?  ? substance abuse ? alcohol use,   ?Endo/Other  ?diabetes, Type 2, Insulin Dependent ?Obesity ?Na 129 ?Cl 94 ?Ca 7.5 ? ? Renal/GU ?ESRF and DialysisRenal disease  ? ?  ?Musculoskeletal ? ?(+) Arthritis ,  ? Abdominal ?  ?Peds ? Hematology ? ?(+) Blood dyscrasia, anemia ,  ?Plt 89k ?   ?Anesthesia Other Findings ?  ?K 4.0, Hgb 11.6, plts 139, INR 1.4 ? Reproductive/Obstetrics ? ?  ? ? ? ? ? ? ? ? ? ? ? ? ? ?  ?  ? ? ? ? ? ? ? ? ?Anesthesia Physical ? ?Anesthesia Plan ? ?ASA: 4 ? ?Anesthesia Plan: General  ? ?Post-op Pain Management: Regional block*  ? ?Induction: Intravenous ? ?PONV Risk Score and Plan: 2 and Treatment may vary due to age or medical condition, Ondansetron, Midazolam and Dexamethasone ? ?Airway Management Planned: LMA ? ?Additional Equipment: None ? ?Intra-op Plan:  ? ?Post-operative Plan: Extubation in OR ? ?Informed Consent: I have reviewed the patients History and Physical,  chart, labs and discussed the procedure including the risks, benefits and alternatives for the proposed anesthesia with the patient or authorized representative who has indicated his/her understanding and acceptance.  ? ? ? ?Dental advisory given ? ?Plan Discussed with: CRNA and Anesthesiologist ? ?Anesthesia Plan Comments:   ? ? ? ? ? ?Anesthesia Quick Evaluation ? ?

## 2022-01-31 NOTE — Interval H&P Note (Signed)
History and Physical Interval Note: ? ?01/31/2022 ?7:19 AM ? ?Alexander [name]  has presented today for surgery, with the diagnosis of Gangrene Right Below Knee Amputation.  The various methods of treatment have been discussed with the patient and family. After consideration of risks, benefits and other options for treatment, the patient has consented to  Procedure(s): ?RIGHT ABOVE KNEE AMPUTATION (Right) as a surgical intervention.  The patient's history has been reviewed, patient examined, no change in status, stable for surgery.  I have reviewed the patient's chart and labs.  Questions were answered to the patient's satisfaction.   ? ? ?Newt Minion ? ? ?

## 2022-01-31 NOTE — Progress Notes (Signed)
Inpatient Rehabilitation Admissions Coordinator  ? ?Rehab consult received, Current payor trends with [facility] Medicare unlikely to approve a CIR admit. Recommend other rehab venues to be pursued. We will sign off. ? ?Danne Baxter, RN, MSN ?Rehab Admissions Coordinator ?[phone] ?01/31/2022 8:26 PM ? ?

## 2022-01-31 NOTE — Op Note (Signed)
01/31/2022 ? ?2:04 PM ? ?PATIENT:  Alexander [name]   ? ?PRE-OPERATIVE DIAGNOSIS:  Gangrene Right Below Knee Amputation ? ?POST-OPERATIVE DIAGNOSIS:  Same ? ?PROCEDURE:  RIGHT ABOVE KNEE AMPUTATION ?Application Prevena wound VAC ? ?SURGEON:  Newt Minion, MD ? ?PHYSICIAN ASSISTANT:None ?ANESTHESIA:   General ? ?PREOPERATIVE INDICATIONS:  Alexander [name] is a  61 y.o. male with a diagnosis of Gangrene Right Below Knee Amputation who failed conservative measures and elected for surgical management.   ? ?The risks benefits and alternatives were discussed with the patient preoperatively including but not limited to the risks of infection, bleeding, nerve injury, cardiopulmonary complications, the need for revision surgery, among others, and the patient was willing to proceed. ? ?OPERATIVE IMPLANTS: None ? ?'@ENCIMAGES'$ @ ? ?OPERATIVE FINDINGS: Good petechial bleeding extensive calcification of the femoral artery ? ?OPERATIVE PROCEDURE: Patient was brought the operating room and underwent a general anesthetic.  After adequate levels anesthesia were obtained patient's right lower extremity was prepped using DuraPrep draped into a sterile field the gangrenous below-knee amputation was draped out of the sterile field with impervious stockinette.  A fishmouth incision was made through the distal thigh this was carried down to the medial vascular bundle this was clamped and suture ligated with 2-0 silk x2.  The femur was transected just proximal to the skin incision the remainder of the amputation was completed electrocautery was used for hemostasis.  The deep and superficial fascial layers were closed using #1 Vicryl the skin was closed using staples the Praveena Arthur form wound VAC was applied this had a good suction fit patient was extubated taken the PACU in stable condition ? ? ?DISCHARGE PLANNING: ? ?Antibiotic duration: Continue current antibiotics ? ?Weightbearing: Transfer training ? ?Pain medication: Opioid  pathway ? ?Dressing care/ Wound VAC: Continue wound VAC for 1 week ? ?Ambulatory devices: Walker ? ?Discharge to: Discharge planning based on therapy recommendations ? ?Follow-up: In the office 1 week post operative. ? ? ? ? ? ? ?  ?

## 2022-01-31 NOTE — Transfer of Care (Signed)
Immediate Anesthesia Transfer of Care Note ? ?Patient: Alexander [name] ? ?Procedure(s) Performed: RIGHT ABOVE KNEE AMPUTATION (Right: Knee) ? ?Patient Location: PACU ? ?Anesthesia Type:General ? ?Level of Consciousness: drowsy and patient cooperative ? ?Airway & Oxygen Therapy: Patient Spontanous Breathing and Patient connected to face mask oxygen ? ?Post-op Assessment: Report given to RN and Post -op Vital signs reviewed and stable ? ?Post vital signs: Reviewed and stable ? ?Last Vitals:  ?Vitals Value Taken Time  ?BP 112/78 01/31/22 1417  ?Temp    ?Pulse 89 01/31/22 1421  ?Resp 17 01/31/22 1422  ?SpO2 100 % 01/31/22 1421  ?Vitals shown include unvalidated device data. ? ?Last Pain:  ?Vitals:  ? 01/31/22 1116  ?TempSrc: Oral  ?PainSc:   ?   ? ?Patients Stated Pain Goal:  (Reports, " I don't know".) (01/29/22 1504) ? ?Complications: No notable events documented. ?

## 2022-01-31 NOTE — Anesthesia Procedure Notes (Signed)
Procedure Name: LMA Insertion ?Date/Time: 01/31/2022 1:32 PM ?Performed by: Janene Harvey, CRNA ?Pre-anesthesia Checklist: Patient identified, Emergency Drugs available, Suction available and Patient being monitored ?Patient Re-evaluated:Patient Re-evaluated prior to induction ?Oxygen Delivery Method: Circle system utilized ?Preoxygenation: Pre-oxygenation with 100% oxygen ?Induction Type: IV induction ?LMA Size: 4.0 ?Number of attempts: 1 ?Placement Confirmation: positive ETCO2 and breath sounds checked- equal and bilateral ?Tube secured with: Tape ?Dental Injury: Teeth and Oropharynx as per pre-operative assessment  ? ? ? ? ?

## 2022-01-31 NOTE — Progress Notes (Addendum)
?[facility] ?Progress Note  ? ?Subjective: To OR today for R AKA per [name]. Seen in room, fully awake without C/O pain. HD tomorrow on schedule. Discussed that we will lower EDW on discharge D/T AKA.  ? ?Objective ?Vitals:  ? 01/31/22 1435 01/31/22 1450 01/31/22 1505 01/31/22 1518  ?BP: (!) 94/54 107/76 121/73 117/72  ?Pulse: 93 96  79  ?Resp: (!) '25 16  18  '$ ?Temp:   97.6 ?F (36.4 ?C) 97.6 ?F (36.4 ?C)  ?TempSrc:    Oral  ?SpO2: 100% 99% 99% 99%  ?Weight:      ?Height:      ? ?Physical Exam ?General: Chronically ill appearing male in NAD.  ?Heart: S1,S2 RRR. No M/R/G ?Lungs: CTAB ?Abdomen: obese, NT. NABS ?Extremities: LLE wrapped in una boots. L toe amps. New R AKA with ACE wrap, WV present.  ?Dialysis Access: R AVF + T/B  ? ? ?Additional Objective ?Labs: ?Basic Metabolic Panel: ?Recent Labs  ?Lab 01/25/22 ?0816 01/26/22 ?1112 01/29/22 ?0602 01/30/22 ?0708 01/31/22 ?1142  ?NA 136 136 130* 135 135  ?K 3.7 4.2 5.5* 4.9 4.0  ?CL 98 99 100 99 96*  ?CO2 25 25 21* 26  --   ?GLUCOSE 116* 149* 122* 84 84  ?BUN 27* 23 33* 46* 31*  ?CREATININE 2.81* 2.69* 3.45* 3.97* 2.70*  ?CALCIUM 8.3* 8.3* 8.3* 8.8*  --   ?PHOS 4.6  --  4.6 5.3*  --   ? ?Liver Function Tests: ?Recent Labs  ?Lab 01/25/22 ?0816 01/29/22 ?0602 01/30/22 ?0708  ?ALBUMIN 2.2* 2.0* 2.1*  ? ?No results for input(s): LIPASE, AMYLASE in the last 168 hours. ?CBC: ?Recent Labs  ?Lab 01/25/22 ?6283 01/26/22 ?1112 01/29/22 ?0602 01/30/22 ?0708 01/31/22 ?1142  ?WBC 9.7 10.1 9.9 9.3  --   ?HGB 9.6* 9.4* 10.1* 9.7* 11.6*  ?HCT 31.3* 31.8* 33.4* 31.6* 34.0*  ?MCV 98.1 100.6* 96.8 97.5  --   ?PLT 157 145* PLATELET CLUMPS NOTED ON SMEAR, UNABLE TO ESTIMATE 139*  --   ? ?Blood Culture ?   ?Component Value Date/Time  ? SDES WOUND 01/19/2022 1452  ? SPECREQUEST L3/L4 [place] ASPIRATION 01/19/2022 1452  ? CULT  01/19/2022 1452  ?  No growth aerobically or anaerobically. ?Performed at [facility], [place] [address] [place], [place] [address] ?  ? REPTSTATUS  01/24/2022 FINAL 01/19/2022 1452  ? ? ?Cardiac Enzymes: ?No results for input(s): CKTOTAL, CKMB, CKMBINDEX, TROPONINI in the last 168 hours. ?CBG: ?Recent Labs  ?Lab 01/30/22 ?1636 01/30/22 ?2045 01/31/22 ?0723 01/31/22 ?1057 01/31/22 ?1416  ?GLUCAP 250* 169* 210* 103* 87  ? ?Iron Studies: No results for input(s): IRON, TIBC, TRANSFERRIN, FERRITIN in the last 72 hours. ?'@lablastinr3'$ @ ?Studies/Results: ?No results found. ?Medications: ? sodium chloride    ? sodium chloride    ? magnesium sulfate bolus IVPB    ? ? (feeding supplement) PROSource Plus  30 mL Oral BID BM  ? amoxicillin  500 mg Oral Q24H  ? [START ON 02/01/2022] apixaban  5 mg Oral BID  ? vitamin C  1,000 mg Oral Daily  ? atorvastatin  20 mg Oral QHS  ? calcitRIOL  0.25 mcg Oral Daily  ? chlorhexidine      ? Chlorhexidine Gluconate Cloth  6 each Topical Q0600  ? [START ON 02/01/2022] darbepoetin (ARANESP) injection - DIALYSIS  100 mcg Intravenous Q Thu-HD  ? [START ON 02/01/2022] docusate sodium  100 mg Oral Daily  ? doxycycline  100 mg Oral Q12H  ?  furosemide  80 mg Oral Once per day on Sun Mon Wed Fri  ? gabapentin  300 mg Oral TID  ? HYDROmorphone      ? insulin aspart  0-6 Units Subcutaneous TID WC  ? insulin aspart protamine- aspart  10 Units Subcutaneous BID WC  ? lidocaine  1 patch Transdermal Q24H  ? metoprolol tartrate  25 mg Oral BID  ? multivitamin with minerals  1 tablet Oral Once per day on Mon Thu  ? [START ON 02/01/2022] nutrition supplement (JUVEN)  1 packet Oral BID BM  ? pantoprazole  40 mg Oral Daily  ? sodium chloride flush  3 mL Intravenous Q12H  ? zinc sulfate  220 mg Oral Daily  ? ? ? ?Dialysis Orders: ?TTS - GOC ?4hrs56mn, BFR 400, DFR 800,  EDW 94kg, 3K/ 2.5Ca ?-No Heparin ?-Mircera 200 mcg IV q 2wks - last 01/11/22 ?-Calcitriol 0.275m PO TIW ?  ?  ?Assessment/Plan: ?1 Lumbar back pain/Radiculopathy: Followed by Spine Surgery Dr. CrSaintclair HalstedMR Lumbar Spine completed: showed fractures L2-L3 with fluid-? Trauma vs. Infection. Neurosurgeon  canceled surgery secondary to high risk. L2-3 disc aspiration 01/19/2022 culture negative.Continue pain management and Pt/OT. Eventually as outpt either will need PMD or pain clinic to manage.  ?2. PVD with Dry gangrene:R AKA on Wednesday 01/31/2022 per Dr. DuSharol GivenWill continue to observe LLE.  ?3. ESRD - on HD TTS. HD today on schedule. Next HD 02/01/2022. No heparin.  ?4. Hypertension/volume  -BP stable this a.m. Volume is stable. Very much under OP EDW if wt is accurate. Lower EDW on discharge. UF as tolerated.  ?5. Anemia of CKD - Hgb 11.6 01/31/2022. He rec'd 60 mcg Aranesp  3/16./2023. Hold next dose of Aranesp as HGB >11.0.  Follow HGB. ? 6. Secondary Hyperparathyroidism - PO4 at goal. Continue VDRA. ?7. Nutrition - Renal diet on fluid restriction. Albumin very low. On protein supps, renal vitamins  ?8. DMT2-per primary ?9. 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 ABX. Follows with cards. ?  ? ?Tayanna Talford H. Lidie Glade NP-C ?01/31/2022, 4:11 PM  ?[facility] ?[phone] ? ?  ? ?

## 2022-01-31 NOTE — Anesthesia Procedure Notes (Signed)
Anesthesia Regional Block: Sciatic  ? ?Pre-Anesthetic Checklist: , timeout performed,  Correct Patient, Correct Site, Correct Laterality,  Correct Procedure, Correct Position, site marked,  Risks and benefits discussed,  Surgical consent,  Pre-op evaluation,  At surgeon's request and post-op pain management ? ?Laterality: Right ? ?Prep: chloraprep     ?  ?Needles:  ?Injection technique: Single-shot ? ?Needle Type: Echogenic Stimulator Needle   ? ? ?Needle Length: 10cm  ?Needle Gauge: 20  ? ? ? ?Additional Needles: ? ? ?Procedures:,,,, ultrasound used (permanent image in chart),,    ?Narrative:  ?Start time: 01/31/2022 12:15 PM ?End time: 01/31/2022 12:22 PM ?Injection made incrementally with aspirations every 5 mL. ? ?Performed by: Personally  ?Anesthesiologist: Lidia Collum, MD ? ?Additional Notes: ?Standard monitors applied. Skin prepped. Good needle visualization with ultrasound. Injection made in 5cc increments with no resistance to injection. Patient tolerated the procedure well. ? ? ? ? ? ?

## 2022-01-31 NOTE — Anesthesia Postprocedure Evaluation (Signed)
Anesthesia Post Note ? ?Patient: Alexander [name] ? ?Procedure(s) Performed: RIGHT ABOVE KNEE AMPUTATION (Right: Knee) ? ?  ? ?Patient location during evaluation: PACU ?Anesthesia Type: General ?Level of consciousness: awake and alert ?Pain management: pain level controlled ?Vital Signs Assessment: post-procedure vital signs reviewed and stable ?Respiratory status: spontaneous breathing, nonlabored ventilation and respiratory function stable ?Cardiovascular status: blood pressure returned to baseline and stable ?Postop Assessment: no apparent nausea or vomiting ?Anesthetic complications: no ? ? ?No notable events documented. ? ?Last Vitals:  ?Vitals:  ? 01/31/22 1505 01/31/22 1518  ?BP: 121/73 117/72  ?Pulse:  79  ?Resp:  18  ?Temp: 36.4 ?C 36.4 ?C  ?SpO2: 99% 99%  ?  ?Last Pain:  ?Vitals:  ? 01/31/22 1518  ?TempSrc: Oral  ?PainSc:   ? ? ?  ?LLE Sensation: Full sensation (01/31/22 1505) ?  ?RLE Sensation: Full sensation (01/31/22 1505) ?  ?  ? ?Lidia Collum ? ? ? ? ?

## 2022-01-31 NOTE — Progress Notes (Signed)
? Alexander [name]  JXB:[id number] DOB: 02/09/1961 DOA: 01/13/2022 ?PCP: [name], MD   ? ?Brief Narrative:  ?61 year old with a history of ESRD on HD TTS, HTN, HLD, PVD status post right BKA and left midfoot amputation, A-fib on chronic Eliquis, CAD, DM2, and pacemaker placement who presented to the ED with intractable low back pain over several days with lower extremity radiation and radiculopathy. ? ?The patient underwent MRI with aspiration of a disc space 3/10.  Initially there was a plan for the patient to go to surgery for decompression of an area of spinal stenosis, but ultimately it was felt that his risk for surgery was too high and the decision was made to cancel surgery. ? ?Consultants:  ?Cardiology -Dr. [name] ?Neurosurgery ?Nephrology ?IR ?[name] ? ?Code Status: FULL CODE ? ?DVT prophylaxis: ?Eliquis  ? ?Interim Hx: ?To go to the OR today ? ?Assessment & Plan: ? ?Lumbar back pain - L2-3 severe spinal stenosis - radiculopathy of left lower extremity ?CT noted moderate neuroforaminal stenosis at L2-3 and L5-S1 - MRI lumbar spine noted subacute fractures L2-L3 disc and severe multifactorial spinal stenosis at L2-3 - followed by Dr. Saintclair Halsted with Neurosurgery - surgical intervention canceled due to high risk - IR completed disc aspiration 3/10 with no evidence of infection - has completed a course of IV Decadron - goal at this time is pain management - PT/OT recommend SNF but patient refused - adjusted Neurontin for neuropathic pain 3/16 -adjusted narcotic again for low back pain 3/16 - pain has been much better controlled for the past few days -continue with attempts to fine-tune management -encouraged patient that ambulation necessary ? ?PVD with dry gangrene of right transtibial amputation site -left transmetatarsal wound poorly healing ?Followed in office by [name] - last seen as outpt 01/10/22 for weeping from B LE wounds - placed in compression wrapping - weeping persists here in hospital - Dr.  [name] saw 01/16/22 w/ plan to continue trial w/ compression wrapping - was seen in f/u again 3/16 and initially suggested B LE AKA - arterial studies however make it clear AKA not possible on L due to arterial graft - plan presently is for R AKA 3/22 and ongoing observation of the L with wound care ? ?ESRD on HD TTS ?Nephrology consulted ? ?Paroxysmal atrial fibrillation ?Continue Lopressor and Eliquis - heart rate controlled ? ?HTN ?Blood pressure well controlled ? ?Hyperlipidemia ?Continue statin ? ?DM 2 ?CBG presently at goal -continue to follow trend ? ?Chronic pacemaker infection - Severe tricuspid regurgitation with flail tricuspid valve leaflet - possible vegetation anterior mitral leaflet ?Followed by ID in outpatient setting - continue suppressive therapy with doxycycline and amoxicillin indefinitely ? ?Pressure Injury 01/14/22 Ischial tuberosity Right Stage 3 -  Full thickness tissue loss. Subcutaneous fat may be visible but bone, tendon or muscle are NOT exposed. (Active)  ?01/14/22 0321  ?Location: Ischial tuberosity  ?Location Orientation: Right  ?Staging: Stage 3 -  Full thickness tissue loss. Subcutaneous fat may be visible but bone, tendon or muscle are NOT exposed.  ?Wound Description (Comments):   ?Present on Admission: Yes  ?Dressing Type Foam - Lift dressing to assess site every shift 01/28/22 2013  ?   ?Pressure Injury 01/14/22 Buttocks Right Stage 2 -  Partial thickness loss of dermis presenting as a shallow open injury with a red, pink wound bed without slough. (Active)  ?01/14/22 0322  ?Location: Buttocks  ?Location Orientation: Right  ?Staging: Stage 2 -  Partial thickness  loss of dermis presenting as a shallow open injury with a red, pink wound bed without slough.  ?Wound Description (Comments):   ?Present on Admission: Yes  ?Dressing Type Foam - Lift dressing to assess site every shift 01/28/22 0800  ?   ?Pressure Injury 01/14/22 Buttocks Posterior;Proximal;Right Stage 2 -  Partial thickness  loss of dermis presenting as a shallow open injury with a red, pink wound bed without slough. (Active)  ?01/14/22 0324  ?Location: Buttocks  ?Location Orientation: Posterior;Proximal;Right  ?Staging: Stage 2 -  Partial thickness loss of dermis presenting as a shallow open injury with a red, pink wound bed without slough.  ?Wound Description (Comments):   ?Present on Admission: Yes  ?Dressing Type -- (refused assessment) 01/26/22 1100  ? ? ?Family Communication: No family present at time of exam ?Disposition: SNF recommended but patient refusing -also refusing HH -now awaiting planned R LE AKA ? ?Objective: ?Blood pressure (!) 115/52, pulse 84, temperature 97.6 ?F (36.4 ?C), temperature source Oral, resp. rate 18, height 6' (1.829 m), weight 89.8 kg, SpO2 99 %. ? ?Intake/Output Summary (Last 24 hours) at 01/31/2022 1327 ?Last data filed at 01/31/2022 1319 ?Gross per 24 hour  ?Intake 700 ml  ?Output --  ?Net 700 ml  ? ?Filed Weights  ? 01/27/22 2049 01/30/22 0735 01/31/22 1116  ?Weight: 88.7 kg 89 kg 89.8 kg  ? ? ?Examination: ? ?General: Appearance:     ?Overweight male in no acute distress  ?   ?Lungs:     respirations unlabored  ?Heart:    Normal heart rate.    ?MS:   Below knee amputation of right lower extremity is noted. Amputation of left foot is noted. Left  amputation noted. Right  amputation noted.   ?Neurologic:   Awake, alert  ?  ? ?CBC: ?Recent Labs  ?Lab 01/26/22 ?1112 01/29/22 ?0602 01/30/22 ?0708 01/31/22 ?1142  ?WBC 10.1 9.9 9.3  --   ?HGB 9.4* 10.1* 9.7* 11.6*  ?HCT 31.8* 33.4* 31.6* 34.0*  ?MCV 100.6* 96.8 97.5  --   ?PLT 145* PLATELET CLUMPS NOTED ON SMEAR, UNABLE TO ESTIMATE 139*  --   ? ?Basic Metabolic Panel: ?Recent Labs  ?Lab 01/25/22 ?0816 01/26/22 ?1112 01/29/22 ?0602 01/30/22 ?0708 01/31/22 ?1142  ?NA 136 136 130* 135 135  ?K 3.7 4.2 5.5* 4.9 4.0  ?CL 98 99 100 99 96*  ?CO2 25 25 21* 26  --   ?GLUCOSE 116* 149* 122* 84 84  ?BUN 27* 23 33* 46* 31*  ?CREATININE 2.81* 2.69* 3.45* 3.97* 2.70*   ?CALCIUM 8.3* 8.3* 8.3* 8.8*  --   ?PHOS 4.6  --  4.6 5.3*  --   ? ?GFR: ?Estimated Creatinine Clearance: 30.7 mL/min (A) (by C-G formula based on SCr of 2.7 mg/dL (H)). ? ?Liver Function Tests: ?Recent Labs  ?Lab 01/25/22 ?0816 01/29/22 ?0602 01/30/22 ?0708  ?ALBUMIN 2.2* 2.0* 2.1*  ? ? ? ? ?CBG: ?Recent Labs  ?Lab 01/30/22 ?1205 01/30/22 ?1636 01/30/22 ?2045 01/31/22 ?0723 01/31/22 ?1057  ?GLUCAP 84 250* 169* 210* 103*  ? ? ?Scheduled Meds: ? [MAR Hold] (feeding supplement) PROSource Plus  30 mL Oral BID BM  ? [MAR Hold] amoxicillin  500 mg Oral Q24H  ? [MAR Hold] apixaban  5 mg Oral BID  ? [MAR Hold] atorvastatin  20 mg Oral QHS  ? [MAR Hold] calcitRIOL  0.25 mcg Oral Daily  ? chlorhexidine  60 mL Topical Once  ? chlorhexidine      ? [MAR Hold] Chlorhexidine  Gluconate Cloth  6 each Topical Q0600  ? [MAR Hold] darbepoetin (ARANESP) injection - DIALYSIS  100 mcg Intravenous Q Thu-HD  ? [MAR Hold] docusate sodium  100 mg Oral BID  ? [MAR Hold] doxycycline  100 mg Oral Q12H  ? [MAR Hold] furosemide  80 mg Oral Once per day on Sun Mon Wed Fri  ? [MAR Hold] gabapentin  300 mg Oral TID  ? [MAR Hold] insulin aspart  0-6 Units Subcutaneous TID WC  ? [MAR Hold] insulin aspart protamine- aspart  10 Units Subcutaneous BID WC  ? [MAR Hold] lidocaine  1 patch Transdermal Q24H  ? [MAR Hold] metoprolol tartrate  25 mg Oral BID  ? [MAR Hold] multivitamin with minerals  1 tablet Oral Once per day on Mon Thu  ? [MAR Hold] pantoprazole  40 mg Oral Daily  ? povidone-iodine  2 application. Topical Once  ? [MAR Hold] sodium chloride flush  3 mL Intravenous Q12H  ? ? ? LOS: 18 days  ? ?Eulogio Bear DO ? ?If 7PM-7AM, please contact night-coverage per Amion ?01/31/2022, 1:27 PM ? ? ? ?

## 2022-01-31 NOTE — TOC Progression Note (Signed)
Transition of Care (TOC) - Initial/Assessment Note  ? ? ?Patient Details  ?Name: Alexander [name] ?MRN: [id number] ?Date of Birth: 09/22/1961 ? ?Transition of Care (TOC) CM/SW Contact:    ?Paulene Floor Shreena Baines, LCSWA ?Phone Number: ?01/31/2022, 4:53 PM ? ?Clinical Narrative:                 ?CSW consulted for SNF placement now that patient has underwent right AKA.  CSW awaiting PT and OT recommendations post surgery and will engage with patient again about SNF placement as patient declined SNF placement numerous times prior to AKA.   ? ?Expected Discharge Plan: Home/Self Care ?Barriers to Discharge: Continued Medical Work up ? ? ?Patient Goals and CMS Choice ?Patient states their goals for this hospitalization and ongoing recovery are:: Pt states he would like to return home after hospitalization. ?CMS Medicare.gov Compare Post Acute Care list provided to:: Patient ?Choice offered to / list presented to : Patient ? ?Expected Discharge Plan and Services ?Expected Discharge Plan: Home/Self Care ?  ?  ?Post Acute Care Choice: Resumption of Svcs/PTA Provider ?Living arrangements for the past 2 months: [place] ?                ?  ?  ?  ?  ?  ?  ?  ?  ?  ?  ? ?Prior Living Arrangements/Services ?Living arrangements for the past 2 months: [place] ?Lives with:: Adult Children, Spouse ?Patient language and need for interpreter reviewed:: Yes ?Do you feel safe going back to the place where you live?: Yes      ?Need for Family Participation in Patient Care: Yes (Comment) ?Care giver support system in place?: Yes (comment) ?Current home services: DME ?Criminal Activity/Legal Involvement Pertinent to Current Situation/Hospitalization: No - Comment as needed ? ?Activities of Daily Living ?Home Assistive Devices/Equipment: CBG Meter ?ADL Screening (condition at time of admission) ?Patient's cognitive ability adequate to safely complete daily activities?: Yes ?Is the patient deaf or have difficulty hearing?: No ?Does the  patient have difficulty seeing, even when wearing glasses/contacts?: No ?Does the patient have difficulty concentrating, remembering, or making decisions?: No ?Patient able to express need for assistance with ADLs?: Yes ?Does the patient have difficulty dressing or bathing?: Yes ?Independently performs ADLs?: No ?Communication: Independent ?Dressing (OT): Needs assistance ?Is this a change from baseline?: Pre-admission baseline ?Grooming: Independent ?Feeding: Independent ?Bathing: Needs assistance ?Is this a change from baseline?: Change from baseline, expected to last >3 days ?Toileting: Needs assistance ?Is this a change from baseline?: Pre-admission baseline ?In/Out Bed: Needs assistance ?Is this a change from baseline?: Pre-admission baseline ?Does the patient have difficulty walking or climbing stairs?: Yes ?Weakness of Legs: Both ?Weakness of Arms/Hands: Both ? ?Permission Sought/Granted ?Permission sought to share information with : Family Supports ?Permission granted to share information with : No ?   ?   ?   ?   ? ?Emotional Assessment ?Appearance:: Appears stated age ?Attitude/Demeanor/Rapport: Engaged ?Affect (typically observed): Appropriate ?Orientation: : Oriented to Self, Oriented to Place, Oriented to  Time, Oriented to Situation ?Alcohol / Substance Use: Not Applicable ?Psych Involvement: No (comment) ? ?Admission diagnosis:  Intractable back pain [M54.9] ?Patient Active Problem List  ? Diagnosis Date Noted  ? Dehiscence of amputation stump of right lower extremity (HCC)   ? Endocarditis 01/21/2022  ? Ulcer of right leg, limited to breakdown of skin ([place])   ? Below-knee amputation of right lower extremity ([place])   ? Ulcer of left foot,  limited to breakdown of skin ([place])   ? History of transmetatarsal amputation of left foot ([place])   ? Lumbar back pain with radiculopathy affecting left lower extremity 01/14/2022  ? Dyslipidemia 01/14/2022  ? Type 2 diabetes mellitus with chronic kidney disease, with  long-term current use of insulin ([place]) 01/14/2022  ? GERD without esophagitis 01/14/2022  ? Pressure injury of skin 01/14/2022  ? Intractable back pain 01/13/2022  ? Blister 11/08/2021  ? Severe protein-calorie malnutrition ([place]) 11/02/2021  ? Pacemaker infection ([place])   ? Transaminitis   ? Tricuspid valve vegetation   ? Thrombocytopenia ([place]) 10/26/2021  ? Hyponatremia 10/26/2021  ? Acute on chronic diastolic CHF (congestive heart failure) ([place]) 10/26/2021  ? Atrial fibrillation with RVR ([place]) 10/25/2021  ? Symptomatic anemia 09/30/2021  ? ESRD (end stage renal disease) ([place]) 09/30/2021  ? PAF (paroxysmal atrial fibrillation) ([place]) 09/30/2021  ? NSVT (nonsustained ventricular tachycardia)   ? Pancytopenia ([place])   ? Acute blood loss anemia 07/18/2020  ? Hemodialysis patient ([place]) 06/04/2020  ? Necrotizing glomerulonephritis 06/04/2020  ? Heme positive stool   ? Melena   ? Acute GI bleeding 05/23/2020  ? Acute hematogenous osteomyelitis of right foot ([place]) 03/11/2020  ? Gangrene of right foot ([place])   ? Diabetic foot ulcer associated with type 2 diabetes mellitus ([place]) 02/19/2020  ? Heel ulcer ([place]) 11/05/2019  ? Gangrene of extremity due to atherosclerosis of artery ([place]) 09/09/2017  ? Essential hypertension, benign 02/07/2015  ? Renal insufficiency 02/07/2015  ? Post PTCA 11/09/2014  ? S/P PTCA (percutaneous transluminal coronary angioplasty) 11/09/2014  ? Pacemaker: [place] DR MRI [id number] Dual chamber pacemaker 10/08/2014 10/08/2014  ? Encounter for care of pacemaker 10/08/2014  ? Sinus node dysfunction ([place]) 10/08/2014  ? Cardiac asystole ([place]) 10/07/2014  ? Cardiac syncope 10/07/2014  ? Diabetes mellitus with stage 3 chronic kidney disease ([place]) 10/07/2014  ? CAD (coronary artery disease), native coronary artery 10/07/2014  ? Diabetes mellitus due to underlying condition without complications ([place]) 13/24/4010  ? Anemia, unspecified 05/25/2014  ? Syncope 04/30/2014  ? Atherosclerosis of native arteries of  the extremities with gangrene ([place]) 12/16/2013  ? Acute renal failure ([place]) 10/28/2013  ? PVD (peripheral vascular disease) ([place]) 10/27/2013  ? Leukocytosis 10/21/2013  ? Anemia 10/21/2013  ? Osteomyelitis of left foot ([place]) 08/19/2013  ? Spinal stenosis, lumbar region, with neurogenic claudication 06/12/2012  ?  Class: Diagnosis of  ? Diabetes mellitus with end stage renal disease ([place]) 03/15/2009  ? ALCOHOL ABUSE 03/15/2009  ? TOBACCO USE 03/15/2009  ? HYPERTENSION 03/15/2009  ? ?PCP:  [name], MD ?Pharmacy:   ?[facility], [address] ?[place] [address] ?Phone: [phone] Fax: [phone] ? ?[id number] [place], [place] - [address]?Phone: [phone]: [phone]? ?[facility] ?[address]?[place]?Phone: [phone]: [phone] ? ? ? ? ?Social Determinants of Health (SDOH) Interventions ?  ? ?Readmission Risk Interventions ? ?  10/27/2021  ?  3:51 PM 05/26/2020  ?  1:00 PM 03/15/2020  ?  4:52 PM  ?Readmission Risk Prevention Plan  ?Transportation Screening Complete Complete Complete  ?PCP or Specialist Appt within 5-7 Days   Complete  ?PCP or Specialist Appt within 3-5 Days Complete    ?Home Care Screening   Complete  ?Medication Review (RN CM)   Complete  ?[facility]  Consult Complete    ?Social Work Consult for [place] Planning/Counseling Complete    ?Palliative Care Screening Not Applicable    ?Medication Review Press photographer) Complete Complete   ?PCP or Specialist appointment within 3-5 days of discharge  Complete   ?[place] or Home Care Consult  Complete   ?SW Recovery Care/Counseling Consult  Complete   ?Palliative Care Screening  Not Applicable   ?[place]  Not Applicable   ? ? ? ?

## 2022-01-31 NOTE — Progress Notes (Addendum)
Physical Therapy Note ? ? ?Attempted PT session, however pt on his way to OR ?Goals were briefly discussed with pt to achieve slideboard wheelchair transfer from bed in order to go to HD ?Recommendation for dc still remains SNF for appropriate care of pt, although pt still refuses to go ?In order to dc, recommendations include medical Lucianne Lei transport for HD and a hoyer lift for home use ?Will continue to make efforts to maximize independence and safety with mobility and ADLs in preparation for dc ? ? ? ?Tory Emerald, SPT ?Acute Rehab Services ?Office [phone]  ?

## 2022-02-01 ENCOUNTER — Encounter (HOSPITAL_COMMUNITY): Payer: Self-pay | Admitting: Orthopedic Surgery

## 2022-02-01 DIAGNOSIS — M5416 Radiculopathy, lumbar region: Secondary | ICD-10-CM | POA: Diagnosis not present

## 2022-02-01 LAB — CBC
HCT: 28.8 % — ABNORMAL LOW (ref 39.0–52.0)
Hemoglobin: 8.7 g/dL — ABNORMAL LOW (ref 13.0–17.0)
MCH: 29.4 pg (ref 26.0–34.0)
MCHC: 30.2 g/dL (ref 30.0–36.0)
MCV: 97.3 fL (ref 80.0–100.0)
Platelets: 123 10*3/uL — ABNORMAL LOW (ref 150–400)
RBC: 2.96 MIL/uL — ABNORMAL LOW (ref 4.22–5.81)
RDW: 17 % — ABNORMAL HIGH (ref 11.5–15.5)
WBC: 11 10*3/uL — ABNORMAL HIGH (ref 4.0–10.5)
nRBC: 0 % (ref 0.0–0.2)

## 2022-02-01 LAB — BASIC METABOLIC PANEL
Anion gap: 10 (ref 5–15)
BUN: 42 mg/dL — ABNORMAL HIGH (ref 8–23)
CO2: 24 mmol/L (ref 22–32)
Calcium: 8.5 mg/dL — ABNORMAL LOW (ref 8.9–10.3)
Chloride: 100 mmol/L (ref 98–111)
Creatinine, Ser: 3.23 mg/dL — ABNORMAL HIGH (ref 0.61–1.24)
GFR, Estimated: 21 mL/min — ABNORMAL LOW (ref >60)
Glucose, Bld: 227 mg/dL — ABNORMAL HIGH (ref 70–99)
Potassium: 4.8 mmol/L (ref 3.5–5.1)
Sodium: 134 mmol/L — ABNORMAL LOW (ref 135–145)

## 2022-02-01 LAB — GLUCOSE, CAPILLARY
Glucose-Capillary: 185 mg/dL — ABNORMAL HIGH (ref 70–99)
Glucose-Capillary: 164 mg/dL — ABNORMAL HIGH (ref 70–99)
Glucose-Capillary: 103 mg/dL — ABNORMAL HIGH (ref 70–99)

## 2022-02-01 MED ORDER — DARBEPOETIN ALFA 100 MCG/0.5ML IJ SOSY
PREFILLED_SYRINGE | INTRAMUSCULAR | Status: AC
  Administered 2022-02-01: 100 ug
  Filled 2022-02-01: qty 0.5

## 2022-02-01 MED ORDER — DARBEPOETIN ALFA 100 MCG/0.5ML IJ SOSY
100.0000 ug | PREFILLED_SYRINGE | INTRAMUSCULAR | Status: DC
  Administered 2022-02-08: 100 ug via INTRAVENOUS
  Filled 2022-02-01 (×2): qty 0.5

## 2022-02-01 MED ORDER — GABAPENTIN 300 MG PO CAPS
300.0000 mg | ORAL_CAPSULE | Freq: Two times a day (BID) | ORAL | Status: DC
  Administered 2022-02-01 – 2022-02-02 (×2): 300 mg via ORAL
  Filled 2022-02-01 (×2): qty 1

## 2022-02-01 NOTE — Progress Notes (Signed)
Patient ID: Alexander [name], male   DOB: 05/12/1961, 61 y.o.   MRN: [id number] ?Patient is postoperative day 1 right above-the-knee amputation.  The wound VAC has no drainage does not have a good enough suction fit the transition to the Praveena plus pump.  I will reinforce the dressing. ?

## 2022-02-01 NOTE — Progress Notes (Signed)
Physical Therapy Treatment ?Patient Details ?Name: Alexander [name] ?MRN: [id number] ?DOB: [birth year] ?Today's Date: 02/01/2022 ? ? ?History of Present Illness The pt is a 61 yo male presenting 3/4 with chronic back pain and weakness to LLE in addition to frequent falls (x2 on day of admission). Now s/p R AKA on 3/22. PMH: ESRD on HD TTS, paroxysmal atrial fibrillation on Eliquis, pacemaker, CAD s/p stent, diastolic CHF, PVD with right BKA and left midfoot amputation, history of GI bleed, and DM II.  He underwent revision to R AKA on 3/22. ? ?  ?PT Comments  ? ? Patient agreeable for mobility following encouragement and explanation of plan for session. Patient required maxA for bed mobility to achieve sitting EOB. Once on EOB, patient with poor sitting balance requiring maxA initially progressing to min guard. Able to tolerate sitting EOB ~10 minutes. Encouraged patient to initiate transfer training with lateral scoots at EOB but patient reluctant to assist due to fear of falling resulting in totalA+2 for lateral scoot towards HOB. Continue to recommend SNF for ongoing Physical Therapy.  If able, may benefit to look into [facility] for intensive therapies prior to returning home if agreeable. If patient chooses to return home, will need max HH services including [place], [place], and aide.  ?  ?Recommendations for follow up therapy are one component of a multi-disciplinary discharge planning process, led by the attending physician.  Recommendations may be updated based on patient status, additional functional criteria and insurance authorization. ? ?Follow Up Recommendations ? Skilled nursing-short term rehab (<3 hours/day) ?  ?  ?Assistance Recommended at Discharge Frequent or constant Supervision/Assistance  ?Patient can return home with the following Two people to help with walking and/or transfers;A lot of help with bathing/dressing/bathroom;Assistance with cooking/housework;Direct supervision/assist for  medications management;Assist for transportation;Help with stairs or ramp for entrance ?  ?Equipment Recommendations ? Hospital bed;Other (comment) (hoyer lift)  ?  ?Recommendations for Other Services   ? ? ?  ?Precautions / Restrictions Precautions ?Precautions: Fall ?Precaution Comments: new R AKA, his of left midfoot amputation,  also with new lumbar stenosis and l2-L3 discitis ?Restrictions ?Weight Bearing Restrictions: No  ?  ? ?Mobility ? Bed Mobility ?Overal bed mobility: Needs Assistance ?Bed Mobility: Supine to Sit, Sit to Supine ?  ?  ?Supine to sit: Max assist, HOB elevated ?Sit to supine: Mod assist ?  ?General bed mobility comments: Pt needed assisst with bringing his LEs closer to the EOB and then off with the LLE.  He then required mod demonstrational cueing and mod assist to bring trunk up to sitting from elevated HOB. ?  ? ?Transfers ?  ?  ?  ?  ?  ?  ?  ?  ?  ?General transfer comment: patient declined ?  ? ?Ambulation/Gait ?  ?  ?  ?  ?  ?  ?  ?  ? ? ?Stairs ?  ?  ?  ?  ?  ? ? ?Wheelchair Mobility ?  ? ?Modified Rankin (Stroke Patients Only) ?  ? ? ?  ?Balance Overall balance assessment: Needs assistance ?Sitting-balance support: Bilateral upper extremity supported, Feet unsupported ?Sitting balance-Leahy Scale: Poor ?Sitting balance - Comments: Multiple losess of balance backwards and to the left but improved the longer he sat ?  ?  ?  ?  ?  ?  ?  ?  ?  ?  ?  ?  ?  ?  ?  ?  ? ?  ?Cognition Arousal/Alertness:  Awake/alert ?Behavior During Therapy: [facility] for tasks assessed/performed ?Overall Cognitive Status: Within Functional Limits for tasks assessed ?  ?  ?  ?  ?  ?  ?  ?  ?  ?  ?  ?  ?  ?  ?  ?  ?General Comments: Pt still with some self limiting behavior as well as fear of falling. ?  ?  ? ?  ?Exercises   ? ?  ?General Comments   ?  ?  ? ?Pertinent Vitals/Pain Pain Assessment ?Pain Assessment: Faces ?Faces Pain Scale: Hurts even more ?Pain Location: right residual limb and his back ?Pain  Descriptors / Indicators: Discomfort, Burning, Grimacing ?Pain Intervention(s): Monitored during session, Repositioned  ? ? ?Home Living Family/patient expects to be discharged to:: Private residence ?Living Arrangements: Spouse/significant other ?Available Help at Discharge: Family ?Type of Home: House ?Home Access: Ramped entrance ?  ?  ?  ?Home Layout: One level ?Home Equipment: Wheelchair - IT trainer (2 wheels) ?   ?  ?Prior Function    ?  ?  ?   ? ?PT Goals (current goals can now be found in the care plan section) Acute Rehab PT Goals ?PT Goal Formulation: With patient ?Time For Goal Achievement: 02/15/22 ?Potential to Achieve Goals: Fair ?Progress towards PT goals: Progressing toward goals ? ?  ?Frequency ? ? ? Min 3X/week ? ? ? ?  ?PT Plan Current plan remains appropriate  ? ? ?Co-evaluation PT/OT/SLP Co-Evaluation/Treatment: Yes ?Reason for Co-Treatment: To address functional/ADL transfers ?PT goals addressed during session: Mobility/safety with mobility;Balance ?OT goals addressed during session: ADL's and self-care ?  ? ?  ?AM-PAC PT "6 Clicks" Mobility   ?Outcome Measure ? Help needed turning from your back to your side while in a flat bed without using bedrails?: A Lot ?Help needed moving from lying on your back to sitting on the side of a flat bed without using bedrails?: A Lot ?Help needed moving to and from a bed to a chair (including a wheelchair)?: Total ?Help needed standing up from a chair using your arms (e.g., wheelchair or bedside chair)?: Total ?Help needed to walk in hospital room?: Total ?Help needed climbing 3-5 steps with a railing? : Total ?6 Click Score: 8 ? ?  ?End of Session   ?Activity Tolerance: Patient limited by pain ?Patient left: in bed;with call bell/phone within reach;with bed alarm set ?Nurse Communication: Mobility status ?PT Visit Diagnosis: Pain;Unsteadiness on feet (R26.81);Repeated falls (R29.6) ?  ? ? ?Time: 1610-9604 ?PT Time Calculation (min)  (ACUTE ONLY): 38 min ? ?Charges:  $Therapeutic Activity: 23-37 mins          ?          ? ?Wake Conlee A. Gilford Rile, PT, DPT ?Acute Rehabilitation Services ?Pager [phone] ?Office [phone] ? ? ? ?Garnetta Fedrick A Amey Hossain ?02/01/2022, 5:40 PM ? ?

## 2022-02-01 NOTE — Evaluation (Signed)
Occupational Therapy Evaluation Patient Details Name: Alexander [name] MRN: [id number] DOB: Aug 08, 1961 Today's Date: 02/01/2022   History of Present Illness The pt is a 60 yo male presenting 3/4 with chronic back pain and weakness to LLE in addition to frequent falls (x2 on day of admission). PMH: ESRD on HD TTS, paroxysmal atrial fibrillation on Eliquis, pacemaker, CAD s/p stent, diastolic CHF, PVD with right BKA and left midfoot amputation, history of GI bleed, and DM II.  He underwent revision to R AKA on 3/22.   Clinical Impression   Pt currently still needing max assist for supine to sit with max assist for initial sitting balance progressing to min guard after a few mins with BUE support.  Still with increased back pain as well as RLE pain.  Increased fear of falling with total +2 assist for completion of one small scoot to the [facility] prior to laying down.  He currently needs max -total assist for most selfcare tasks sit to supine.  Feel he will benefit from acute care OT to help progress to a min assist level or better for return home with spouse and family.  Also feel he will benefit from post acute rehab at [facility] inpatient for continued progression of he is agreeable.  If not accepted, would recommend SNF as safest option, however was not able to state willingness to either choice on eval.         Recommendations for follow up therapy are one component of a multi-disciplinary discharge planning process, led by the attending physician.  Recommendations may be updated based on patient status, additional functional criteria and insurance authorization.   Follow Up Recommendations  Other (comment) ([facility] vs SNF)    Assistance Recommended at Discharge Frequent or constant Supervision/Assistance  Patient can return home with the following A lot of help with bathing/dressing/bathroom;A lot of help with walking and/or transfers;Assistance with cooking/housework;Help with stairs  or ramp for entrance    Functional Status Assessment  Patient has had a recent decline in their functional status and demonstrates the ability to make significant improvements in function in a reasonable and predictable amount of time.  Equipment Recommendations  Tub/shower bench    Recommendations for Other Services       Precautions / Restrictions Precautions Precautions: Fall Precaution Comments: new R AKA, his of left midfoot amputation,  also with new lumbar stenosis and l2-L3 discitis Restrictions Weight Bearing Restrictions: No      Mobility Bed Mobility Overal bed mobility: Needs Assistance Bed Mobility: Sit to Supine, Supine to Sit     Supine to sit: Max assist (HOB elevated) Sit to supine: Mod assist   General bed mobility comments: Pt needed assisst with bringing his LEs closer to the EOB and then off with the LLE.  He then required mod demonstrational cueing and mod assist to bring trunk up to sitting from elevated HOB.    Transfers                   General transfer comment: patient declined      Balance Overall balance assessment: Needs assistance Sitting-balance support: Bilateral upper extremity supported, Feet unsupported Sitting balance-Leahy Scale: Poor Sitting balance - Comments: Multiple losess of balance backwards and to the left but improved the longer he sat  ADL either performed or assessed with clinical judgement   ADL Overall ADL's : Needs assistance/impaired Eating/Feeding: Set up;Bed level   Grooming: Wash/dry hands;Wash/dry face;Moderate assistance Grooming Details (indicate cue type and reason): sitting EOB unsupported simulated     Lower Body Bathing: Total assistance   Upper Body Dressing : Bed level                   Functional mobility during ADLs: +2 for physical assistance;Total assistance General ADL Comments: Pt able to sit EOB for approximately 10 mins with  initially mod assist but progressing to min guard with BUE support.  He needed total assist +2 for taking one scoot up to the top of the bed before laying down with mod facilitation.  He declined completion of grooming tasks EOB or transfers at this time.     Vision Baseline Vision/History:  (Reports needing to go to the eye Dr secondary to vision changes, but did not specify.  Reports having cataract surgery at sometime and not needing glasses again up until now.) Ability to See in Adequate Light: 0 Adequate Patient Visual Report: Blurring of vision Vision Assessment?: Vision impaired- to be further tested in functional context     Perception Perception Perception: Within Functional Limits   Praxis Praxis Praxis: Intact    Pertinent Vitals/Pain Pain Assessment Pain Assessment: Faces Faces Pain Scale: Hurts even more Pain Location: right residual limb and his back Pain Descriptors / Indicators: Shooting, Discomfort Pain Intervention(s): Limited activity within patient's tolerance, Monitored during session, Repositioned     Hand Dominance Right   Extremity/Trunk Assessment Upper Extremity Assessment Upper Extremity Assessment: Generalized weakness (AROM WFLS grossly with strength overall at least 4/5 grip and elbow flexion.  Did not test further secondary to increased back pain.)   Lower Extremity Assessment Lower Extremity Assessment: Defer to PT evaluation   Cervical / Trunk Assessment Cervical / Trunk Exceptions: significant low back pain, spasms.   Communication Communication Communication: No difficulties   Cognition Arousal/Alertness: Awake/alert Behavior During Therapy: WFL for tasks assessed/performed Overall Cognitive Status: Within Functional Limits for tasks assessed                                 General Comments: Pt still with some self limiting behavior as well as fear of falling.                Home Living Family/patient expects to be  discharged to:: Private residence Living Arrangements: Spouse/significant other Available Help at Discharge: Family Type of Home: House Home Access: Ramped entrance     Home Layout: One level     Bathroom Shower/Tub: Sponge bathes at baseline   Bathroom Toilet: Standard Bathroom Accessibility: No   Home Equipment: Wheelchair - Nurse, children's (2 wheels)          Prior Functioning/Environment Prior Level of Function : Needs assist             Mobility Comments: supervision to minA to steady slideboard for transfers. ADLs Comments: bird baths, family assists with dressing.        OT Problem List: Decreased strength;Decreased activity tolerance;Impaired balance (sitting and/or standing);Decreased safety awareness;Pain;Decreased knowledge of use of DME or AE      OT Treatment/Interventions: Self-care/ADL training;Therapeutic exercise;Neuromuscular education;Therapeutic activities;DME and/or AE instruction;Balance training;Patient/family education    OT Goals(Current goals can be found in the care plan section) Acute Rehab OT Goals Patient Stated Goal:  Pt wants to be able to transfer to his wheelchair OT Goal Formulation: With patient Time For Goal Achievement: 02/15/22 Potential to Achieve Goals: Good  OT Frequency: Min 2X/week    Co-evaluation PT/OT/SLP Co-Evaluation/Treatment: Yes Reason for Co-Treatment: To address functional/ADL transfers   OT goals addressed during session: ADL's and self-care      AM-PAC OT "6 Clicks" Daily Activity     Outcome Measure Help from another person eating meals?: None Help from another person taking care of personal grooming?: A Little Help from another person toileting, which includes using toliet, bedpan, or urinal?: Total Help from another person bathing (including washing, rinsing, drying)?: Total Help from another person to put on and taking off regular upper body clothing?: A Lot Help from another person  to put on and taking off regular lower body clothing?: Total 6 Click Score: 12   End of Session Nurse Communication: Other (comment) (participation in sitting on EOB)  Activity Tolerance: Patient limited by pain;Other (comment) (self limiting) Patient left: in bed;with call bell/phone within reach;with bed alarm set  OT Visit Diagnosis: Muscle weakness (generalized) (M62.81);Pain Pain - Right/Left: Right Pain - part of body: Leg                Time: 9562-1308 OT Time Calculation (min): 26 min Charges:  OT General Charges $OT Visit: 1 Visit OT Evaluation $OT Eval Moderate Complexity: 1 Mod  Lucina Betty OTR/L 02/01/2022, 3:39 PM

## 2022-02-01 NOTE — Progress Notes (Signed)
?Alexander [name] ?Progress Note  ? ?Subjective: Seen on HD via AVF. C/Os pain, otherwise stable. Has been turned down for placement on [facility]. Will have to pursue outside [employer].    ? ?Objective ?Vitals:  ? 02/01/22 0800 02/01/22 0830 02/01/22 0900 02/01/22 0930  ?BP: (!) 118/57 (!) 117/51 (!) 83/30 (!) 96/56  ?Pulse: 97 81 77 94  ?Resp:      ?Temp:      ?TempSrc:      ?SpO2:      ?Weight:      ?Height:      ? ?Physical Exam ?General: Chronically ill appearing male in NAD.  ?Heart: S1,S2 RRR. No M/R/G ?Lungs: CTAB ?Abdomen: obese, NT. NABS ?Extremities: LLE wrapped in una boots. L toe amps. New R AKA with ACE wrap, WV present.  ?Dialysis Access: R AVF + T/B  ? ? ?Additional Objective ?Labs: ?Basic Metabolic Panel: ?Recent Labs  ?Lab 01/29/22 ?0602 01/30/22 ?0708 01/31/22 ?1142 02/01/22 ?6503  ?NA 130* 135 135 134*  ?K 5.5* 4.9 4.0 4.8  ?CL 100 99 96* 100  ?CO2 21* 26  --  24  ?GLUCOSE 122* 84 84 227*  ?BUN 33* 46* 31* 42*  ?CREATININE 3.45* 3.97* 2.70* 3.23*  ?CALCIUM 8.3* 8.8*  --  8.5*  ?PHOS 4.6 5.3*  --   --   ? ?Liver Function Tests: ?Recent Labs  ?Lab 01/29/22 ?0602 01/30/22 ?0708  ?ALBUMIN 2.0* 2.1*  ? ?No results for input(s): LIPASE, AMYLASE in the last 168 hours. ?CBC: ?Recent Labs  ?Lab 01/26/22 ?1112 01/29/22 ?0602 01/30/22 ?0708 01/31/22 ?1142 02/01/22 ?5465  ?WBC 10.1 9.9 9.3  --  11.0*  ?HGB 9.4* 10.1* 9.7* 11.6* 8.7*  ?HCT 31.8* 33.4* 31.6* 34.0* 28.8*  ?MCV 100.6* 96.8 97.5  --  97.3  ?PLT 145* PLATELET CLUMPS NOTED ON SMEAR, UNABLE TO ESTIMATE 139*  --  123*  ? ?Blood Culture ?   ?Component Value Date/Time  ? SDES WOUND 01/19/2022 1452  ? SPECREQUEST L3/L4 [place] ASPIRATION 01/19/2022 1452  ? CULT  01/19/2022 1452  ?  No growth aerobically or anaerobically. ?Performed at [facility], [place] [address], [place], [place] [address] ?  ? REPTSTATUS 01/24/2022 FINAL 01/19/2022 1452  ? ? ?Cardiac Enzymes: ?No results for input(s): CKTOTAL, CKMB, CKMBINDEX, TROPONINI in the last 168  hours. ?CBG: ?Recent Labs  ?Lab 01/31/22 ?0723 01/31/22 ?1057 01/31/22 ?1416 01/31/22 ?1642 01/31/22 ?2056  ?GLUCAP 210* 103* 87 139* 187*  ? ?Iron Studies: No results for input(s): IRON, TIBC, TRANSFERRIN, FERRITIN in the last 72 hours. ?'@lablastinr3'$ @ ?Studies/Results: ?No results found. ?Medications: ? sodium chloride    ? sodium chloride    ? magnesium sulfate bolus IVPB    ? ? (feeding supplement) PROSource Plus  30 mL Oral BID BM  ? amoxicillin  500 mg Oral Q24H  ? apixaban  5 mg Oral BID  ? vitamin C  1,000 mg Oral Daily  ? atorvastatin  20 mg Oral QHS  ? calcitRIOL  0.25 mcg Oral Daily  ? Chlorhexidine Gluconate Cloth  6 each Topical Q0600  ? docusate sodium  100 mg Oral Daily  ? doxycycline  100 mg Oral Q12H  ? furosemide  80 mg Oral Once per day on Sun Mon Wed Fri  ? gabapentin  300 mg Oral TID  ? insulin aspart  0-6 Units Subcutaneous TID WC  ? insulin aspart protamine- aspart  10 Units Subcutaneous BID WC  ? lidocaine  1 patch Transdermal Q24H  ?  metoprolol tartrate  25 mg Oral BID  ? multivitamin with minerals  1 tablet Oral Once per day on Mon Thu  ? nutrition supplement (JUVEN)  1 packet Oral BID BM  ? pantoprazole  40 mg Oral Daily  ? sodium chloride flush  3 mL Intravenous Q12H  ? zinc sulfate  220 mg Oral Daily  ? ? ? ?Dialysis Orders: ?TTS - GOC ?4hrs56mn, BFR 400, DFR 800,  EDW 94kg, 3K/ 2.5Ca ?-No Heparin ?-Mircera 200 mcg IV q 2wks - last 01/11/22 ?-Calcitriol 0.276m PO TIW ?  ?  ?Assessment/Plan: ?1 Lumbar back pain/Radiculopathy: Followed by Spine Surgery Dr. CrSaintclair HalstedMR Lumbar Spine completed: showed fractures L2-L3 with fluid-? Trauma vs. Infection. Neurosurgeon canceled surgery secondary to high risk. L2-3 disc aspiration 01/19/2022 culture negative.Continue pain management and Pt/OT. Eventually as outpt either will need PMD or pain clinic to manage.  ?2. PVD with Dry gangrene:R AKA on Wednesday 01/31/2022 per Dr. DuSharol GivenWill continue to observe LLE.  ?3. ESRD - on HD TTS. HD today on schedule.  Next HD 02/01/2022. No heparin.  ?4. Hypertension/volume  -BP stable this a.m. Volume is stable. Very much under OP EDW if wt is accurate. Lower EDW on discharge. UF as tolerated.  ?5. Anemia of CKD - Hgb 11.6 01/31/2022 however down to 8.7 this AM. Give Aranesp 100 mcg IV today. Follow HGB. Transfuse PRN.  ? 6. Secondary Hyperparathyroidism - PO4 at goal. Continue VDRA. ?7. Nutrition - Renal diet on fluid restriction. Albumin very low. On protein supps, renal vitamins  ?8. DMT2-per primary ?9. 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 ABX. Follows with cards. ? ?RiJimmye NormanBrown NP-C ?02/01/2022, 9:56 AM  ?[name] ?[phone] ? ?  ? ?

## 2022-02-01 NOTE — Progress Notes (Signed)
? MERRELL BORSUK  WUJ:[id number] DOB: 09-29-1961 DOA: 01/13/2022 ?PCP: [name], MD   ? ?Brief Narrative:  ?61 year old with a history of ESRD on HD TTS, HTN, HLD, PVD status post right BKA and left midfoot amputation, A-fib on chronic Eliquis, CAD, DM2, and pacemaker placement who presented to the ED with intractable low back pain over several days with lower extremity radiation and radiculopathy. ? ?The patient underwent MRI with aspiration of a disc space 3/10.  Initially there was a plan for the patient to go to surgery for decompression of an area of spinal stenosis, but ultimately it was felt that his risk for surgery was too high and the decision was made to cancel surgery. ? ?Now s/p amputation. ? ?Consultants:  ?Cardiology -Dr. [name] ?Neurosurgery ?Nephrology ?IR ?[name] ? ?Code Status: FULL CODE ? ?DVT prophylaxis: ?Eliquis  ? ?Interim Hx: ?Says he is tired ? ? ?Assessment & Plan: ? ?Lumbar back pain - L2-3 severe spinal stenosis - radiculopathy of left lower extremity ?CT noted moderate neuroforaminal stenosis at L2-3 and L5-S1 - MRI lumbar spine noted subacute fractures L2-L3 disc and severe multifactorial spinal stenosis at L2-3 - followed by Dr. Saintclair Halsted with Neurosurgery - surgical intervention canceled due to high risk - IR completed disc aspiration 3/10 with no evidence of infection - has completed a course of IV Decadron - goal at this time is pain management - PT/OT recommend SNF but patient refused - adjusted Neurontin for neuropathic pain 3/16 -adjusted narcotic again for low back pain 3/16 - pain has been much better controlled for the past few days -continue with attempts to fine-tune management -encouraged patient that ambulation/movement/transfers necessary ? ?PVD with dry gangrene of right transtibial amputation site -left transmetatarsal wound poorly healing ?Followed in office by [name] - last seen as outpt 01/10/22 for weeping from B LE wounds - placed in compression wrapping -  weeping persists here in hospital - [name] saw 01/16/22 w/ plan to continue trial w/ compression wrapping - was seen in f/u again 3/16 and initially suggested B LE AKA - arterial studies however make it clear AKA not possible on L due to arterial graft - plan presently is for R AKA 3/22 and ongoing observation of the L with wound care ? ?ESRD on HD TTS ?Nephrology consulted ? ?Paroxysmal atrial fibrillation ?Continue Lopressor and Eliquis - heart rate controlled ? ?HTN ?Blood pressure well controlled ? ?Hyperlipidemia ?Continue statin ? ?DM 2 ?CBG presently at goal -continue to follow trend ? ?Chronic pacemaker infection - Severe tricuspid regurgitation with flail tricuspid valve leaflet - possible vegetation anterior mitral leaflet ?Followed by ID in outpatient setting - continue suppressive therapy with doxycycline and amoxicillin indefinitely ? ?Pressure Injury 01/14/22 Ischial tuberosity Right Stage 3 -  Full thickness tissue loss. Subcutaneous fat may be visible but bone, tendon or muscle are NOT exposed. (Active)  ?01/14/22 0321  ?Location: Ischial tuberosity  ?Location Orientation: Right  ?Staging: Stage 3 -  Full thickness tissue loss. Subcutaneous fat may be visible but bone, tendon or muscle are NOT exposed.  ?Wound Description (Comments):   ?Present on Admission: Yes  ?Dressing Type Foam - Lift dressing to assess site every shift 01/28/22 2013  ?   ?Pressure Injury 01/14/22 Buttocks Right Stage 2 -  Partial thickness loss of dermis presenting as a shallow open injury with a red, pink wound bed without slough. (Active)  ?01/14/22 0322  ?Location: Buttocks  ?Location Orientation: Right  ?Staging: Stage 2 -  Partial thickness loss of dermis presenting as a shallow open injury with a red, pink wound bed without slough.  ?Wound Description (Comments):   ?Present on Admission: Yes  ?Dressing Type Foam - Lift dressing to assess site every shift 01/28/22 0800  ?   ?Pressure Injury 01/14/22 Buttocks  Posterior;Proximal;Right Stage 2 -  Partial thickness loss of dermis presenting as a shallow open injury with a red, pink wound bed without slough. (Active)  ?01/14/22 0324  ?Location: Buttocks  ?Location Orientation: Posterior;Proximal;Right  ?Staging: Stage 2 -  Partial thickness loss of dermis presenting as a shallow open injury with a red, pink wound bed without slough.  ?Wound Description (Comments):   ?Present on Admission: Yes  ?Dressing Type -- (refused assessment) 01/26/22 1100  ? ? ?Family Communication: No family present at time of exam ?Disposition: SNF recommended but patient refusing -also refusing HH ? ?Objective: ?Blood pressure 130/63, pulse 100, temperature 98.4 ?F (36.9 ?C), resp. rate 19, height 6' (1.829 m), weight 83.5 kg, SpO2 96 %. ? ?Intake/Output Summary (Last 24 hours) at 02/01/2022 1407 ?Last data filed at 02/01/2022 1103 ?Gross per 24 hour  ?Intake 360 ml  ?Output 2500 ml  ?Net -2140 ml  ? ?Filed Weights  ? 01/31/22 1116 02/01/22 0716 02/01/22 1103  ?Weight: 89.8 kg 86 kg 83.5 kg  ? ? ?Examination: ?In bed, flat affect ?  ? ?CBC: ?Recent Labs  ?Lab 01/29/22 ?0602 01/30/22 ?0708 01/31/22 ?1142 02/01/22 ?6160  ?WBC 9.9 9.3  --  11.0*  ?HGB 10.1* 9.7* 11.6* 8.7*  ?HCT 33.4* 31.6* 34.0* 28.8*  ?MCV 96.8 97.5  --  97.3  ?PLT PLATELET CLUMPS NOTED ON SMEAR, UNABLE TO ESTIMATE 139*  --  123*  ? ?Basic Metabolic Panel: ?Recent Labs  ?Lab 01/29/22 ?0602 01/30/22 ?0708 01/31/22 ?1142 02/01/22 ?7371  ?NA 130* 135 135 134*  ?K 5.5* 4.9 4.0 4.8  ?CL 100 99 96* 100  ?CO2 21* 26  --  24  ?GLUCOSE 122* 84 84 227*  ?BUN 33* 46* 31* 42*  ?CREATININE 3.45* 3.97* 2.70* 3.23*  ?CALCIUM 8.3* 8.8*  --  8.5*  ?PHOS 4.6 5.3*  --   --   ? ?GFR: ?Estimated Creatinine Clearance: 25.7 mL/min (A) (by C-G formula based on SCr of 3.23 mg/dL (H)). ? ?Liver Function Tests: ?Recent Labs  ?Lab 01/29/22 ?0602 01/30/22 ?0708  ?ALBUMIN 2.0* 2.1*  ? ? ? ? ?CBG: ?Recent Labs  ?Lab 01/31/22 ?1057 01/31/22 ?1416 01/31/22 ?1642  01/31/22 ?2056 02/01/22 ?1148  ?GLUCAP 103* 87 139* 187* 185*  ? ? ?Scheduled Meds: ? (feeding supplement) PROSource Plus  30 mL Oral BID BM  ? amoxicillin  500 mg Oral Q24H  ? apixaban  5 mg Oral BID  ? vitamin C  1,000 mg Oral Daily  ? atorvastatin  20 mg Oral QHS  ? calcitRIOL  0.25 mcg Oral Daily  ? Chlorhexidine Gluconate Cloth  6 each Topical Q0600  ? darbepoetin (ARANESP) injection - DIALYSIS  100 mcg Intravenous Q Thu-HD  ? docusate sodium  100 mg Oral Daily  ? doxycycline  100 mg Oral Q12H  ? furosemide  80 mg Oral Once per day on Sun Mon Wed Fri  ? gabapentin  300 mg Oral TID  ? insulin aspart  0-6 Units Subcutaneous TID WC  ? insulin aspart protamine- aspart  10 Units Subcutaneous BID WC  ? lidocaine  1 patch Transdermal Q24H  ? metoprolol tartrate  25 mg Oral BID  ? multivitamin with minerals  1 tablet Oral Once per day on Mon Thu  ? nutrition supplement (JUVEN)  1 packet Oral BID BM  ? pantoprazole  40 mg Oral Daily  ? sodium chloride flush  3 mL Intravenous Q12H  ? zinc sulfate  220 mg Oral Daily  ? ? ? LOS: 19 days  ? ?Eulogio Bear DO ? ?If 7PM-7AM, please contact night-coverage per Amion ?02/01/2022, 2:07 PM ? ? ? ?

## 2022-02-01 NOTE — Progress Notes (Signed)
Ok to reduce gabapentin to '300mg'$  BID based on renal function per Dr. Eliseo Squires. ? ?Onnie Boer, PharmD, BCIDP, AAHIVP, CPP ?Infectious Disease Pharmacist ?02/01/2022 3:07 PM ? ? ?

## 2022-02-02 DIAGNOSIS — M5416 Radiculopathy, lumbar region: Secondary | ICD-10-CM | POA: Diagnosis not present

## 2022-02-02 LAB — GLUCOSE, CAPILLARY
Glucose-Capillary: 85 mg/dL (ref 70–99)
Glucose-Capillary: 112 mg/dL — ABNORMAL HIGH (ref 70–99)
Glucose-Capillary: 161 mg/dL — ABNORMAL HIGH (ref 70–99)
Glucose-Capillary: 186 mg/dL — ABNORMAL HIGH (ref 70–99)

## 2022-02-02 LAB — BASIC METABOLIC PANEL
Anion gap: 9 (ref 5–15)
BUN: 22 mg/dL (ref 8–23)
CO2: 28 mmol/L (ref 22–32)
Calcium: 8.1 mg/dL — ABNORMAL LOW (ref 8.9–10.3)
Chloride: 98 mmol/L (ref 98–111)
Creatinine, Ser: 2.55 mg/dL — ABNORMAL HIGH (ref 0.61–1.24)
GFR, Estimated: 28 mL/min — ABNORMAL LOW (ref >60)
Glucose, Bld: 89 mg/dL (ref 70–99)
Potassium: 3.7 mmol/L (ref 3.5–5.1)
Sodium: 135 mmol/L (ref 135–145)

## 2022-02-02 LAB — CBC
HCT: 29.8 % — ABNORMAL LOW (ref 39.0–52.0)
Hemoglobin: 9 g/dL — ABNORMAL LOW (ref 13.0–17.0)
MCH: 29.3 pg (ref 26.0–34.0)
MCHC: 30.2 g/dL (ref 30.0–36.0)
MCV: 97.1 fL (ref 80.0–100.0)
Platelets: 131 10*3/uL — ABNORMAL LOW (ref 150–400)
RBC: 3.07 MIL/uL — ABNORMAL LOW (ref 4.22–5.81)
RDW: 17.1 % — ABNORMAL HIGH (ref 11.5–15.5)
WBC: 9.6 10*3/uL (ref 4.0–10.5)
nRBC: 0 % (ref 0.0–0.2)

## 2022-02-02 MED ORDER — METHOCARBAMOL 500 MG PO TABS
500.0000 mg | ORAL_TABLET | Freq: Three times a day (TID) | ORAL | Status: DC | PRN
  Administered 2022-02-02 – 2022-02-14 (×15): 500 mg via ORAL
  Filled 2022-02-02 (×15): qty 1

## 2022-02-02 NOTE — Progress Notes (Signed)
?[facility] ?Progress Note  ? ?Subjective:  Seen in room. No CP/dyspnea. Says pain meds are making him feel loopy, but pain is controlled for now. ? ?Objective ?Vitals:  ? 02/01/22 1646 02/01/22 2136 02/02/22 0453 02/02/22 0934  ?BP: 127/90 105/69 127/78 (!) 96/55  ?Pulse: 73 96 85 92  ?Resp: '19 18 17 17  '$ ?Temp: 98 ?F (36.7 ?C) 98.1 ?F (36.7 ?C) 98 ?F (36.7 ?C) (!) 97.4 ?F (36.3 ?C)  ?TempSrc:   Oral   ?SpO2: 99% 100% 100% 93%  ?Weight:      ?Height:      ? ?Physical Exam ?General: Chronically ill appearing man, NAD ?Heart: RRR; no murmur ?Lungs: CTA anteriorly ?Abdomen: soft ?Extremities: R AKA with wound vac, L leg bandaged with toe amputations ?Dialysis Access: RUE AVF + bruit ? ?Additional Objective ?Labs: ?Basic Metabolic Panel: ?Recent Labs  ?Lab 01/29/22 ?0602 01/30/22 ?0708 01/31/22 ?1142 02/01/22 ?7494 02/02/22 ?0310  ?NA 130* 135 135 134* 135  ?K 5.5* 4.9 4.0 4.8 3.7  ?CL 100 99 96* 100 98  ?CO2 21* 26  --  24 28  ?GLUCOSE 122* 84 84 227* 89  ?BUN 33* 46* 31* 42* 22  ?CREATININE 3.45* 3.97* 2.70* 3.23* 2.55*  ?CALCIUM 8.3* 8.8*  --  8.5* 8.1*  ?PHOS 4.6 5.3*  --   --   --   ? ?Liver Function Tests: ?Recent Labs  ?Lab 01/29/22 ?0602 01/30/22 ?0708  ?ALBUMIN 2.0* 2.1*  ? ?CBC: ?Recent Labs  ?Lab 01/26/22 ?1112 01/29/22 ?0602 01/30/22 ?0708 01/31/22 ?1142 02/01/22 ?4967 02/02/22 ?0310  ?WBC 10.1 9.9 9.3  --  11.0* 9.6  ?HGB 9.4* 10.1* 9.7* 11.6* 8.7* 9.0*  ?HCT 31.8* 33.4* 31.6* 34.0* 28.8* 29.8*  ?MCV 100.6* 96.8 97.5  --  97.3 97.1  ?PLT 145* PLATELET CLUMPS NOTED ON SMEAR, UNABLE TO ESTIMATE 139*  --  123* 131*  ? ?Medications: ? sodium chloride    ? sodium chloride    ? magnesium sulfate bolus IVPB    ? ? (feeding supplement) PROSource Plus  30 mL Oral BID BM  ? amoxicillin  500 mg Oral Q24H  ? apixaban  5 mg Oral BID  ? vitamin C  1,000 mg Oral Daily  ? atorvastatin  20 mg Oral QHS  ? calcitRIOL  0.25 mcg Oral Daily  ? Chlorhexidine Gluconate Cloth  6 each Topical Q0600  ? darbepoetin  (ARANESP) injection - DIALYSIS  100 mcg Intravenous Q Thu-HD  ? docusate sodium  100 mg Oral Daily  ? doxycycline  100 mg Oral Q12H  ? furosemide  80 mg Oral Once per day on Sun Mon Wed Fri  ? insulin aspart  0-6 Units Subcutaneous TID WC  ? insulin aspart protamine- aspart  10 Units Subcutaneous BID WC  ? lidocaine  1 patch Transdermal Q24H  ? metoprolol tartrate  25 mg Oral BID  ? multivitamin with minerals  1 tablet Oral Once per day on Mon Thu  ? nutrition supplement (JUVEN)  1 packet Oral BID BM  ? pantoprazole  40 mg Oral Daily  ? sodium chloride flush  3 mL Intravenous Q12H  ? zinc sulfate  220 mg Oral Daily  ? ? ?Dialysis Orders: ?TTS - GOC ?4hrs2mn, BFR 400, DFR 800,  EDW 94kg, 3K/ 2.5Ca No Heparin ?- Mircera 200 mcg IV q 2wks - last 01/11/22 ?- Calcitriol 0.235m PO TIW ? ?Assessment/Plan: ?Lumbar back pain/Radiculopathy: Followed by Spine Surgery Dr. CrSaintclair HalstedMRI Lumbar Spine completed: showed fractures L2-L3  with fluid, infection v. trauma. Neurosurgeon canceled surgery secondary to high risk. L2-3 disc aspiration 01/19/2022 - culture negative.Continue pain management and Pt/OT. Eventually as outpt either will need PMD or pain clinic to manage.  ?PVD with dry gangrene s/p R AKA 01/31/2022 per [name]: Per ortho. L AKA not an option d/t bypass graft -> observing for now. ?ESRD: Continue HD per usual TTS schedule - no heparin for now. Next HD tomorrow. ?Hypertension/volume: BP fine today, no overt hypervolemia. Will need lower EDW on discharge. ?Anemia of ESRD: Hgb 9.4 - continue Aranesp 100 mcg q Thursday. Follow. ?Secondary Hyperparathyroidism: Ca/Phos at goal. Continue VDRA, no binders for now. ?Nutrition: Albumin very low. 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 He is on chronic prophylactic antibiotics (indefinitely) - amoxicillin + doxycycline. ?A-fib on Eliquis ? ?Veneta Penton, PA-C ?02/02/2022, 10:46 AM  ?[facility] ? ? ? ?

## 2022-02-02 NOTE — Progress Notes (Signed)
? Alexander [name]  [id number] DOB: September 16, 1961 DOA: 01/13/2022 ?PCP: [name], MD   ? ?Brief Narrative:  ?61 year old with a history of ESRD on HD TTS, HTN, HLD, PVD status post right BKA and left midfoot amputation, A-fib on chronic Eliquis, CAD, DM2, and pacemaker placement who presented to the ED with intractable low back pain over several days with lower extremity radiation and radiculopathy. ? ?The patient underwent MRI with aspiration of a disc space 3/10.  Initially there was a plan for the patient to go to surgery for decompression of an area of spinal stenosis, but ultimately it was felt that his risk for surgery was too high and the decision was made to cancel surgery. ? ?Now s/p amputation.  Currently agreeable for SNF.   ? ?Consultants:  ?Cardiology -Dr. [name] ?Neurosurgery ?Nephrology ?IR ?[name] ? ?Code Status: FULL CODE ? ?DVT prophylaxis: ?Eliquis  ? ?Interim Hx: ?Having some tremors ? ? ?Assessment & Plan: ? ?Lumbar back pain - L2-3 severe spinal stenosis - radiculopathy of left lower extremity ?CT noted moderate neuroforaminal stenosis at L2-3 and L5-S1 - MRI lumbar spine noted subacute fractures L2-L3 disc and severe multifactorial spinal stenosis at L2-3 - followed by Dr. Saintclair Halsted with Neurosurgery - surgical intervention canceled due to high risk - IR completed disc aspiration 3/10 with no evidence of infection - has completed a course of IV Decadron - goal at this time is pain management - PT/OT recommend SNF but patient refused - adjusted Neurontin for neuropathic pain 3/16 -adjusted narcotic again for low back pain 3/16 - pain has been much better controlled for the past few days -continue with attempts to fine-tune management -encouraged patient that ambulation/movement/transfers necessary ?-hold neurontin  ? ?PVD with dry gangrene of right transtibial amputation site -left transmetatarsal wound poorly healing ?Followed in office by [name] - last seen as outpt 01/10/22 for weeping  from B LE wounds - placed in compression wrapping - weeping persists here in hospital - [name] saw 01/16/22 w/ plan to continue trial w/ compression wrapping - was seen in f/u again 3/16 and initially suggested B LE AKA - arterial studies however make it clear AKA not possible on L due to arterial graft - plan presently is for R AKA 3/22 and ongoing observation of the L with wound care ? ?ESRD on HD TTS ?Nephrology consulted ? ?Paroxysmal atrial fibrillation ?Continue Lopressor and Eliquis - heart rate controlled ? ?HTN ?Blood pressure well controlled ? ?Hyperlipidemia ?Continue statin ? ?DM 2 ?CBG presently at goal -continue to follow trend ? ?Chronic pacemaker infection - Severe tricuspid regurgitation with flail tricuspid valve leaflet - possible vegetation anterior mitral leaflet ?Followed by ID in outpatient setting - continue suppressive therapy with doxycycline and amoxicillin indefinitely ? ?Pressure Injury 01/14/22 Ischial tuberosity Right Stage 3 -  Full thickness tissue loss. Subcutaneous fat may be visible but bone, tendon or muscle are NOT exposed. (Active)  ?01/14/22 0321  ?Location: Ischial tuberosity  ?Location Orientation: Right  ?Staging: Stage 3 -  Full thickness tissue loss. Subcutaneous fat may be visible but bone, tendon or muscle are NOT exposed.  ?Wound Description (Comments):   ?Present on Admission: Yes  ?Dressing Type Foam - Lift dressing to assess site every shift 02/01/22 2000  ?   ?Pressure Injury 01/14/22 Buttocks Right Stage 2 -  Partial thickness loss of dermis presenting as a shallow open injury with a red, pink wound bed without slough. (Active)  ?01/14/22 0322  ?Location: Buttocks  ?  Location Orientation: Right  ?Staging: Stage 2 -  Partial thickness loss of dermis presenting as a shallow open injury with a red, pink wound bed without slough.  ?Wound Description (Comments):   ?Present on Admission: Yes  ?Dressing Type Foam - Lift dressing to assess site every shift 01/28/22 0800  ?    ?Pressure Injury 01/14/22 Buttocks Posterior;Proximal;Right Stage 2 -  Partial thickness loss of dermis presenting as a shallow open injury with a red, pink wound bed without slough. (Active)  ?01/14/22 0324  ?Location: Buttocks  ?Location Orientation: Posterior;Proximal;Right  ?Staging: Stage 2 -  Partial thickness loss of dermis presenting as a shallow open injury with a red, pink wound bed without slough.  ?Wound Description (Comments):   ?Present on Admission: Yes  ?Dressing Type -- (refused assessment) 01/26/22 1100  ? ? ?Family Communication: No family present at time of exam ?Disposition: SNF ? ?Objective: ?Blood pressure (!) 96/55, pulse 92, temperature (!) 97.4 ?F (36.3 ?C), resp. rate 17, height 6' (1.829 m), weight 83.5 kg, SpO2 93 %. ? ?Intake/Output Summary (Last 24 hours) at 02/02/2022 1504 ?Last data filed at 02/02/2022 0126 ?Gross per 24 hour  ?Intake 720 ml  ?Output 0 ml  ?Net 720 ml  ? ?Filed Weights  ? 01/31/22 1116 02/01/22 0716 02/01/22 1103  ?Weight: 89.8 kg 86 kg 83.5 kg  ? ? ?Examination: ? ?General: Appearance:    Chronically ill appearing male in no acute distress  ?   ?Lungs:     Clear to auscultation bilaterally, respirations unlabored  ?Heart:    Normal heart rate. Irregularly irregular rhythm. No murmurs, rubs, or gallops.   ?MS:   Below knee amputation of right lower extremity is noted. Amputation of left foot is noted. Left  amputation noted. Right  amputation noted.   ?Neurologic:   Awake, alert  ?  ?  ? ?CBC: ?Recent Labs  ?Lab 01/30/22 ?0708 01/31/22 ?1142 02/01/22 ?0442 02/02/22 ?0310  ?WBC 9.3  --  11.0* 9.6  ?HGB 9.7* 11.6* 8.7* 9.0*  ?HCT 31.6* 34.0* 28.8* 29.8*  ?MCV 97.5  --  97.3 97.1  ?PLT 139*  --  123* 131*  ? ?Basic Metabolic Panel: ?Recent Labs  ?Lab 01/29/22 ?0602 01/30/22 ?0708 01/31/22 ?1142 02/01/22 ?1610 02/02/22 ?0310  ?NA 130* 135 135 134* 135  ?K 5.5* 4.9 4.0 4.8 3.7  ?CL 100 99 96* 100 98  ?CO2 21* 26  --  24 28  ?GLUCOSE 122* 84 84 227* 89  ?BUN 33* 46* 31*  42* 22  ?CREATININE 3.45* 3.97* 2.70* 3.23* 2.55*  ?CALCIUM 8.3* 8.8*  --  8.5* 8.1*  ?PHOS 4.6 5.3*  --   --   --   ? ?GFR: ?Estimated Creatinine Clearance: 32.5 mL/min (A) (by C-G formula based on SCr of 2.55 mg/dL (H)). ? ?Liver Function Tests: ?Recent Labs  ?Lab 01/29/22 ?0602 01/30/22 ?0708  ?ALBUMIN 2.0* 2.1*  ? ? ? ? ?CBG: ?Recent Labs  ?Lab 02/01/22 ?1148 02/01/22 ?1644 02/01/22 ?1956 02/02/22 ?0718 02/02/22 ?1124  ?GLUCAP 185* 164* 103* 85 112*  ? ? ?Scheduled Meds: ? (feeding supplement) PROSource Plus  30 mL Oral BID BM  ? amoxicillin  500 mg Oral Q24H  ? apixaban  5 mg Oral BID  ? vitamin C  1,000 mg Oral Daily  ? atorvastatin  20 mg Oral QHS  ? calcitRIOL  0.25 mcg Oral Daily  ? Chlorhexidine Gluconate Cloth  6 each Topical Q0600  ? darbepoetin (ARANESP) injection - DIALYSIS  100 mcg Intravenous Q Thu-HD  ? docusate sodium  100 mg Oral Daily  ? doxycycline  100 mg Oral Q12H  ? furosemide  80 mg Oral Once per day on Sun Mon Wed Fri  ? insulin aspart  0-6 Units Subcutaneous TID WC  ? insulin aspart protamine- aspart  10 Units Subcutaneous BID WC  ? lidocaine  1 patch Transdermal Q24H  ? metoprolol tartrate  25 mg Oral BID  ? multivitamin with minerals  1 tablet Oral Once per day on Mon Thu  ? nutrition supplement (JUVEN)  1 packet Oral BID BM  ? pantoprazole  40 mg Oral Daily  ? sodium chloride flush  3 mL Intravenous Q12H  ? zinc sulfate  220 mg Oral Daily  ? ? ? LOS: 20 days  ? ?Eulogio Bear DO ? ?If 7PM-7AM, please contact night-coverage per Amion ?02/02/2022, 3:04 PM ? ? ? ?

## 2022-02-02 NOTE — Progress Notes (Signed)
Physical Therapy Treatment ?Patient Details ?Name: Alexander [name] ?MRN: [id number] ?DOB: 12-Nov-1961 ?Today's Date: 02/02/2022 ? ? ?History of Present Illness The pt is a 61 yo male presenting 3/4 with chronic back pain and weakness to LLE in addition to frequent falls (x2 on day of admission). Now s/p R AKA on 3/22. PMH: ESRD on HD TTS, paroxysmal atrial fibrillation on Eliquis, pacemaker, CAD s/p stent, diastolic CHF, PVD with right BKA and left midfoot amputation, history of GI bleed, and DM II.   ? ?  ?PT Comments  ? ? Patient participatory and progressing towards therapy goals. Patient able to progress to EOB with modA for LE management. Initially min guard for safety while sitting EOB with B UE support progressing to supervision for ~5 minutes. Patient complaining of LLE burning pain and requesting to return to supine. Discussed with patient on need for short term rehab to become stronger and be able to do more for himself at home without extensive assist, patient seemed to consider but will continue to discuss with patient as it is unsafe for patient to return home at this time. Continue to recommend SNF for ongoing Physical Therapy.   ?  ?Recommendations for follow up therapy are one component of a multi-disciplinary discharge planning process, led by the attending physician.  Recommendations may be updated based on patient status, additional functional criteria and insurance authorization. ? ?Follow Up Recommendations ? Skilled nursing-short term rehab (<3 hours/day) ?  ?  ?Assistance Recommended at Discharge Frequent or constant Supervision/Assistance  ?Patient can return home with the following Two people to help with walking and/or transfers;A lot of help with bathing/dressing/bathroom;Assistance with cooking/housework;Direct supervision/assist for medications management;Assist for transportation;Help with stairs or ramp for entrance ?  ?Equipment Recommendations ? Hospital bed;Other (comment) (hoyer lift)  ?   ?Recommendations for Other Services   ? ? ?  ?Precautions / Restrictions Precautions ?Precautions: Fall ?Precaution Comments: new R AKA, his of left midfoot amputation,  also with new lumbar stenosis and l2-L3 discitis ?Restrictions ?Weight Bearing Restrictions: No  ?  ? ?Mobility ? Bed Mobility ?Overal bed mobility: Needs Assistance ?Bed Mobility: Supine to Sit, Sit to Supine ?  ?  ?Supine to sit: Mod assist ?Sit to supine: Min assist ?  ?General bed mobility comments: modA to bring LEs over towards EOB and some assist for trunk elevation. Cues for log roll over towards R side. ?  ? ?Transfers ?  ?  ?  ?  ?  ?  ?  ?  ?  ?General transfer comment: patient declined ?  ? ?Ambulation/Gait ?  ?  ?  ?  ?  ?  ?  ?  ? ? ?Stairs ?  ?  ?  ?  ?  ? ? ?Wheelchair Mobility ?  ? ?Modified Rankin (Stroke Patients Only) ?  ? ? ?  ?Balance Overall balance assessment: Needs assistance ?Sitting-balance support: Bilateral upper extremity supported, Feet unsupported ?Sitting balance-Leahy Scale: Poor ?Sitting balance - Comments: sitting EOB with min guard initially progressing to close supervision. ?  ?  ?  ?  ?  ?  ?  ?  ?  ?  ?  ?  ?  ?  ?  ?  ? ?  ?Cognition Arousal/Alertness: Awake/alert ?Behavior During Therapy: [facility] for tasks assessed/performed ?Overall Cognitive Status: Within Functional Limits for tasks assessed ?  ?  ?  ?  ?  ?  ?  ?  ?  ?  ?  ?  ?  ?  ?  ?  ?  General Comments: self limiting and has a fear of falling ?  ?  ? ?  ?Exercises Other Exercises ?Other Exercises: modified crunches sitting EOB x 5 ? ?  ?General Comments   ?  ?  ? ?Pertinent Vitals/Pain Pain Assessment ?Pain Assessment: Faces ?Faces Pain Scale: Hurts whole lot ?Pain Location: R residual limb and L LE ?Pain Descriptors / Indicators: Discomfort, Burning, Grimacing ?Pain Intervention(s): Monitored during session, Repositioned  ? ? ?Home Living   ?  ?  ?  ?  ?  ?  ?  ?  ?  ?   ?  ?Prior Function    ?  ?  ?   ? ?PT Goals (current goals can now be found in the  care plan section) Acute Rehab PT Goals ?Patient Stated Goal: to be able to get stronger and go home ?PT Goal Formulation: With patient ?Time For Goal Achievement: 02/15/22 ?Potential to Achieve Goals: Fair ?Progress towards PT goals: Progressing toward goals ? ?  ?Frequency ? ? ? Min 3X/week ? ? ? ?  ?PT Plan Current plan remains appropriate  ? ? ?Co-evaluation   ?  ?  ?  ?  ? ?  ?AM-PAC PT "6 Clicks" Mobility   ?Outcome Measure ? Help needed turning from your back to your side while in a flat bed without using bedrails?: A Lot ?Help needed moving from lying on your back to sitting on the side of a flat bed without using bedrails?: A Lot ?Help needed moving to and from a bed to a chair (including a wheelchair)?: Total ?Help needed standing up from a chair using your arms (e.g., wheelchair or bedside chair)?: Total ?Help needed to walk in hospital room?: Total ?Help needed climbing 3-5 steps with a railing? : Total ?6 Click Score: 8 ? ?  ?End of Session   ?Activity Tolerance: Patient limited by pain ?Patient left: in bed;with call bell/phone within reach;with bed alarm set ?Nurse Communication: Mobility status ?PT Visit Diagnosis: Pain;Unsteadiness on feet (R26.81);Repeated falls (R29.6) ?  ? ? ?Time: 4332-9518 ?PT Time Calculation (min) (ACUTE ONLY): 20 min ? ?Charges:  $Therapeutic Activity: 8-22 mins          ?          ? ?Alexander [name], PT, DPT ?Acute Rehabilitation Services ?Pager [phone] ?Office [phone] ? ? ? ?Alexander [name] ?02/02/2022, 2:38 PM ? ?

## 2022-02-02 NOTE — TOC Progression Note (Signed)
Transition of Care (TOC) - Progression Note  ? ? ?Patient Details  ?Name: Alexander [name] ?MRN: [id number] ?Date of Birth: 08-Oct-1961 ? ?Transition of Care (TOC) CM/SW Contact  ?Tom-Johnson, Renea Ee, RN ?Phone Number: ?02/02/2022, 2:51 PM ? ?Clinical Narrative:    ? ?CM spoke with [facility] supervisor about a safe discharge plan for patient as he is refusing SNF and not physically ready to go home. CM asked about the STAR program and supervisor will refer patient the STAR program.  ?CM had another conversation with patient about a safe discharge plan. CM explained again the benefits of patient going to rehab vs going home as he is not physically stable to go home due to his pain. Patient verbalized that he knows that he should go to rehab for a short while before going home to build on his strength. Request CM to call and talk to his wife.  ?CM called Alexander [name] [phone]) and she stated that she is not physically able to care for patient at this time as he needs more that one person assist. States their daughter goes to work most of the day and she will be the only one left to care for him. Requesting patient go to rehab. ?CM went back and spoke with patient and told him his wife's response. Patient voiced understanding and accepted going to rehab.  ?MD and SW notified. TOC will continue to follow with needs. ? ?Expected Discharge Plan: Home/Self Care ?Barriers to Discharge: Continued Medical Work up ? ?Expected Discharge Plan and Services ?Expected Discharge Plan: Home/Self Care ?  ?  ?Post Acute Care Choice: Resumption of Svcs/PTA Provider ?Living arrangements for the past 2 months: [place] ?                ?  ?  ?  ?  ?  ?  ?  ?  ?  ?  ? ? ?Social Determinants of Health (SDOH) Interventions ?  ? ?Readmission Risk Interventions ? ?  10/27/2021  ?  3:51 PM 05/26/2020  ?  1:00 PM 03/15/2020  ?  4:52 PM  ?Readmission Risk Prevention Plan  ?Transportation Screening Complete Complete Complete  ?PCP or Specialist  Appt within 5-7 Days   Complete  ?PCP or Specialist Appt within 3-5 Days Complete    ?Home Care Screening   Complete  ?Medication Review (RN CM)   Complete  ?[place] or Home Care Consult Complete    ?Social Work Consult for [place] Planning/Counseling Complete    ?Palliative Care Screening Not Applicable    ?Medication Review Press photographer) Complete Complete   ?PCP or Specialist appointment within 3-5 days of discharge  Complete   ?[place] or Home Care Consult  Complete   ?SW Recovery Care/Counseling Consult  Complete   ?Palliative Care Screening  Not Applicable   ?[place]  Not Applicable   ? ? ?

## 2022-02-03 DIAGNOSIS — M5416 Radiculopathy, lumbar region: Secondary | ICD-10-CM | POA: Diagnosis not present

## 2022-02-03 LAB — RENAL FUNCTION PANEL
Albumin: 2 g/dL — ABNORMAL LOW (ref 3.5–5.0)
Anion gap: 11 (ref 5–15)
BUN: 32 mg/dL — ABNORMAL HIGH (ref 8–23)
CO2: 26 mmol/L (ref 22–32)
Calcium: 8.1 mg/dL — ABNORMAL LOW (ref 8.9–10.3)
Chloride: 96 mmol/L — ABNORMAL LOW (ref 98–111)
Creatinine, Ser: 3.39 mg/dL — ABNORMAL HIGH (ref 0.61–1.24)
GFR, Estimated: 20 mL/min — ABNORMAL LOW (ref >60)
Glucose, Bld: 141 mg/dL — ABNORMAL HIGH (ref 70–99)
Phosphorus: 5.3 mg/dL — ABNORMAL HIGH (ref 2.5–4.6)
Potassium: 3.8 mmol/L (ref 3.5–5.1)
Sodium: 133 mmol/L — ABNORMAL LOW (ref 135–145)

## 2022-02-03 LAB — GLUCOSE, CAPILLARY
Glucose-Capillary: 141 mg/dL — ABNORMAL HIGH (ref 70–99)
Glucose-Capillary: 125 mg/dL — ABNORMAL HIGH (ref 70–99)
Glucose-Capillary: 177 mg/dL — ABNORMAL HIGH (ref 70–99)
Glucose-Capillary: 124 mg/dL — ABNORMAL HIGH (ref 70–99)

## 2022-02-03 LAB — CBC
HCT: 28.9 % — ABNORMAL LOW (ref 39.0–52.0)
Hemoglobin: 8.8 g/dL — ABNORMAL LOW (ref 13.0–17.0)
MCH: 29.6 pg (ref 26.0–34.0)
MCHC: 30.4 g/dL (ref 30.0–36.0)
MCV: 97.3 fL (ref 80.0–100.0)
Platelets: UNDETERMINED 10*3/uL (ref 150–400)
RBC: 2.97 MIL/uL — ABNORMAL LOW (ref 4.22–5.81)
RDW: 16.9 % — ABNORMAL HIGH (ref 11.5–15.5)
WBC: 8.5 10*3/uL (ref 4.0–10.5)
nRBC: 0 % (ref 0.0–0.2)

## 2022-02-03 NOTE — Progress Notes (Signed)
?[facility] ?Progress Note  ? ?Subjective:   Seen on HD - 1.5L UFG and tolerating. Denies CP or dyspnea. Some leg pain today. ? ?Objective ?Vitals:  ? 02/03/22 0701 02/03/22 0714 02/03/22 0717 02/03/22 0800  ?BP: 126/71 117/69 111/68 107/67  ?Pulse: 95 71 67 99  ?Resp: 17 17    ?Temp: 97.9 ?F (36.6 ?C)     ?TempSrc: Oral     ?SpO2: 96%     ?Weight:  84.2 kg    ?Height:      ? ?Physical Exam ?General: Chronically ill appearing man, NAD, room air ?Heart: RRR; no murmur ?Lungs: CTA anteriorly ?Abdomen: soft ?Extremities: R AKA with wound vac, L leg bandaged with toe amputations ?Dialysis Access: RUE AVF + bruit ?  ?Additional Objective ?Labs: ?Basic Metabolic Panel: ?Recent Labs  ?Lab 01/29/22 ?0602 01/30/22 ?0708 01/31/22 ?1142 02/01/22 ?5697 02/02/22 ?0310 02/03/22 ?0725  ?NA 130* 135   < > 134* 135 133*  ?K 5.5* 4.9   < > 4.8 3.7 3.8  ?CL 100 99   < > 100 98 96*  ?CO2 21* 26  --  '24 28 26  '$ ?GLUCOSE 122* 84   < > 227* 89 141*  ?BUN 33* 46*   < > 42* 22 32*  ?CREATININE 3.45* 3.97*   < > 3.23* 2.55* 3.39*  ?CALCIUM 8.3* 8.8*  --  8.5* 8.1* 8.1*  ?PHOS 4.6 5.3*  --   --   --  5.3*  ? < > = values in this interval not displayed.  ? ?Liver Function Tests: ?Recent Labs  ?Lab 01/29/22 ?0602 01/30/22 ?0708 02/03/22 ?0725  ?ALBUMIN 2.0* 2.1* 2.0*  ? ?CBC: ?Recent Labs  ?Lab 01/29/22 ?0602 01/30/22 ?0708 01/31/22 ?1142 02/01/22 ?9480 02/02/22 ?0310 02/03/22 ?1655  ?WBC 9.9 9.3  --  11.0* 9.6 8.5  ?HGB 10.1* 9.7*   < > 8.7* 9.0* 8.8*  ?HCT 33.4* 31.6*   < > 28.8* 29.8* 28.9*  ?MCV 96.8 97.5  --  97.3 97.1 97.3  ?PLT PLATELET CLUMPS NOTED ON SMEAR, UNABLE TO ESTIMATE 139*  --  123* 131* PLATELET CLUMPS NOTED ON SMEAR, UNABLE TO ESTIMATE  ? < > = values in this interval not displayed.  ? ?Medications: ? sodium chloride    ? sodium chloride    ? magnesium sulfate bolus IVPB    ? ? (feeding supplement) PROSource Plus  30 mL Oral BID BM  ? amoxicillin  500 mg Oral Q24H  ? apixaban  5 mg Oral BID  ? vitamin C  1,000  mg Oral Daily  ? atorvastatin  20 mg Oral QHS  ? calcitRIOL  0.25 mcg Oral Daily  ? Chlorhexidine Gluconate Cloth  6 each Topical Q0600  ? darbepoetin (ARANESP) injection - DIALYSIS  100 mcg Intravenous Q Thu-HD  ? docusate sodium  100 mg Oral Daily  ? doxycycline  100 mg Oral Q12H  ? furosemide  80 mg Oral Once per day on Sun Mon Wed Fri  ? insulin aspart  0-6 Units Subcutaneous TID WC  ? insulin aspart protamine- aspart  10 Units Subcutaneous BID WC  ? lidocaine  1 patch Transdermal Q24H  ? metoprolol tartrate  25 mg Oral BID  ? multivitamin with minerals  1 tablet Oral Once per day on Mon Thu  ? nutrition supplement (JUVEN)  1 packet Oral BID BM  ? pantoprazole  40 mg Oral Daily  ? sodium chloride flush  3 mL Intravenous Q12H  ? zinc sulfate  220 mg Oral Daily  ? ? ?Dialysis Orders: ?TTS - GOC ?4hrs68mn, BFR 400, DFR 800,  EDW 94kg, 3K/ 2.5Ca No Heparin ?- Mircera 200 mcg IV q 2wks - last 01/11/22 ?- Calcitriol 0.270m PO TIW ?  ?Assessment/Plan: ?Lumbar back pain/Radiculopathy: Followed by Spine Surgery Dr. CrSaintclair HalstedMRI Lumbar Spine completed: showed fractures L2-L3 with fluid, infection v. trauma. Neurosurgeon canceled surgery secondary to high risk. L2-3 disc aspiration 01/19/2022 - culture negative. Continue pain management and Pt/OT. Eventually as outpt either will need PMD or pain clinic to manage.  ?PVD with dry gangrene s/p R AKA 01/31/2022 per Dr. DuSharol GivenPer ortho. L AKA not an option d/t bypass graft -> observing for now. ?ESRD: Continue HD per usual TTS schedule - HD now, no heparin, 1.5L UFG. ?Hypertension/volume: BP fine today, no overt hypervolemia. Will need lower EDW on discharge. ?Anemia of ESRD: Hgb 8.8 - continue Aranesp 100 mcg q Thursday. Follow. ?Secondary Hyperparathyroidism: Ca/Phos at goal. Continue VDRA, no binders for now. ?Nutrition: Albumin very low. 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 He is on chronic  prophylactic antibiotics (indefinitely) - amoxicillin + doxycycline. ?A-fib on Eliquis ? ?KaVeneta PentonPA-C ?02/03/2022, 8:04 AM  ?[facility] ? ? ? ?

## 2022-02-03 NOTE — Progress Notes (Signed)
? Alexander [name]  IFO:[id number] DOB: [birth year] DOA: 01/13/2022 ?PCP: [name], MD   ? ?Brief Narrative:  ?61 year old with a history of ESRD on HD TTS, HTN, HLD, PVD status post right BKA and left midfoot amputation, A-fib on chronic Eliquis, CAD, DM2, and pacemaker placement who presented to the ED with intractable low back pain over several days with lower extremity radiation and radiculopathy. ? ?The patient underwent MRI with aspiration of a disc space 3/10.  Initially there was a plan for the patient to go to surgery for decompression of an area of spinal stenosis, but ultimately it was felt that his risk for surgery was too high and the decision was made to cancel surgery. ? ?Now s/p amputation.  Currently agreeable for SNF.   ? ?Consultants:  ?Cardiology -Dr. [name] ?Neurosurgery ?Nephrology ?IR ?[name] ? ?Code Status: FULL CODE ? ?DVT prophylaxis: ?Eliquis  ? ?Interim Hx: ?Sleeping on HD ? ? ?Assessment & Plan: ? ?Lumbar back pain - L2-3 severe spinal stenosis - radiculopathy of left lower extremity ?CT noted moderate neuroforaminal stenosis at L2-3 and L5-S1 - MRI lumbar spine noted subacute fractures L2-L3 disc and severe multifactorial spinal stenosis at L2-3 - followed by Dr. Saintclair Halsted with Neurosurgery - surgical intervention canceled due to high risk - IR completed disc aspiration 3/10 with no evidence of infection - has completed a course of IV Decadron - goal at this time is pain management - PT/OT recommend SNF but patient refused - adjusted Neurontin for neuropathic pain 3/16 -adjusted narcotic again for low back pain 3/16 - pain has been much better controlled for the past few days -continue with attempts to fine-tune management -encouraged patient that ambulation/movement/transfers necessary ?-hold neurontin for tremors ? ?PVD with dry gangrene of right transtibial amputation site -left transmetatarsal wound poorly healing ?Followed in office by [name] - last seen as outpt 01/10/22 for  weeping from B LE wounds - placed in compression wrapping - weeping persists here in hospital - [name] saw 01/16/22 w/ plan to continue trial w/ compression wrapping - was seen in f/u again 3/16 and initially suggested B LE AKA - arterial studies however make it clear AKA not possible on L due to arterial graft - plan presently is for R AKA 3/22 and ongoing observation of the L with wound care ? ?ESRD on HD TTS ?Nephrology consulted ? ?Paroxysmal atrial fibrillation ?Continue Lopressor and Eliquis - heart rate controlled ? ?HTN ?Blood pressure well controlled ? ?Hyperlipidemia ?Continue statin ? ?DM 2 ?CBG presently at goal -continue to follow trend ? ?Chronic pacemaker infection - Severe tricuspid regurgitation with flail tricuspid valve leaflet - possible vegetation anterior mitral leaflet ?Followed by ID in outpatient setting - continue suppressive therapy with doxycycline and amoxicillin indefinitely ? ?Pressure Injury 01/14/22 Ischial tuberosity Right Stage 3 -  Full thickness tissue loss. Subcutaneous fat may be visible but bone, tendon or muscle are NOT exposed. (Active)  ?01/14/22 0321  ?Location: Ischial tuberosity  ?Location Orientation: Right  ?Staging: Stage 3 -  Full thickness tissue loss. Subcutaneous fat may be visible but bone, tendon or muscle are NOT exposed.  ?Wound Description (Comments):   ?Present on Admission: Yes  ?Dressing Type Foam - Lift dressing to assess site every shift 02/01/22 2000  ?   ?Pressure Injury 01/14/22 Buttocks Right Stage 2 -  Partial thickness loss of dermis presenting as a shallow open injury with a red, pink wound bed without slough. (Active)  ?01/14/22 0322  ?Location:  Buttocks  ?Location Orientation: Right  ?Staging: Stage 2 -  Partial thickness loss of dermis presenting as a shallow open injury with a red, pink wound bed without slough.  ?Wound Description (Comments):   ?Present on Admission: Yes  ?Dressing Type Foam - Lift dressing to assess site every shift 01/28/22  0800  ?   ?Pressure Injury 01/14/22 Buttocks Posterior;Proximal;Right Stage 2 -  Partial thickness loss of dermis presenting as a shallow open injury with a red, pink wound bed without slough. (Active)  ?01/14/22 0324  ?Location: Buttocks  ?Location Orientation: Posterior;Proximal;Right  ?Staging: Stage 2 -  Partial thickness loss of dermis presenting as a shallow open injury with a red, pink wound bed without slough.  ?Wound Description (Comments):   ?Present on Admission: Yes  ?Dressing Type -- (refused assessment) 01/26/22 1100  ? ? ?Family Communication: No family present at time of exam ?Disposition: SNF ? ?Objective: ?Blood pressure 129/72, pulse 89, temperature 98.5 ?F (36.9 ?C), temperature source Oral, resp. rate 14, height 6' (1.829 m), weight 82 kg, SpO2 98 %. ? ?Intake/Output Summary (Last 24 hours) at 02/03/2022 1420 ?Last data filed at 02/03/2022 1122 ?Gross per 24 hour  ?Intake --  ?Output 1500 ml  ?Net -1500 ml  ? ?Filed Weights  ? 02/03/22 0503 02/03/22 0714 02/03/22 1122  ?Weight: 83.2 kg 84.2 kg 82 kg  ? ? ?Examination: ? ?Sleeping on HD ?  ?  ? ?CBC: ?Recent Labs  ?Lab 02/01/22 ?0442 02/02/22 ?0310 02/03/22 ?6644  ?WBC 11.0* 9.6 8.5  ?HGB 8.7* 9.0* 8.8*  ?HCT 28.8* 29.8* 28.9*  ?MCV 97.3 97.1 97.3  ?PLT 123* 131* PLATELET CLUMPS NOTED ON SMEAR, UNABLE TO ESTIMATE  ? ?Basic Metabolic Panel: ?Recent Labs  ?Lab 01/29/22 ?0602 01/30/22 ?0708 01/31/22 ?1142 02/01/22 ?0347 02/02/22 ?0310 02/03/22 ?0725  ?NA 130* 135   < > 134* 135 133*  ?K 5.5* 4.9   < > 4.8 3.7 3.8  ?CL 100 99   < > 100 98 96*  ?CO2 21* 26  --  '24 28 26  '$ ?GLUCOSE 122* 84   < > 227* 89 141*  ?BUN 33* 46*   < > 42* 22 32*  ?CREATININE 3.45* 3.97*   < > 3.23* 2.55* 3.39*  ?CALCIUM 8.3* 8.8*  --  8.5* 8.1* 8.1*  ?PHOS 4.6 5.3*  --   --   --  5.3*  ? < > = values in this interval not displayed.  ? ?GFR: ?Estimated Creatinine Clearance: 24.5 mL/min (A) (by C-G formula based on SCr of 3.39 mg/dL (H)). ? ?Liver Function Tests: ?Recent Labs   ?Lab 01/29/22 ?0602 01/30/22 ?0708 02/03/22 ?0725  ?ALBUMIN 2.0* 2.1* 2.0*  ? ? ? ? ?CBG: ?Recent Labs  ?Lab 02/02/22 ?1124 02/02/22 ?1639 02/02/22 ?2057 02/03/22 ?4259 02/03/22 ?1149  ?GLUCAP 112* 161* 186* 141* 125*  ? ? ?Scheduled Meds: ? (feeding supplement) PROSource Plus  30 mL Oral BID BM  ? amoxicillin  500 mg Oral Q24H  ? apixaban  5 mg Oral BID  ? vitamin C  1,000 mg Oral Daily  ? atorvastatin  20 mg Oral QHS  ? calcitRIOL  0.25 mcg Oral Daily  ? Chlorhexidine Gluconate Cloth  6 each Topical Q0600  ? darbepoetin (ARANESP) injection - DIALYSIS  100 mcg Intravenous Q Thu-HD  ? docusate sodium  100 mg Oral Daily  ? doxycycline  100 mg Oral Q12H  ? furosemide  80 mg Oral Once per day on Sun Mon Wed Fri  ?  insulin aspart  0-6 Units Subcutaneous TID WC  ? insulin aspart protamine- aspart  10 Units Subcutaneous BID WC  ? lidocaine  1 patch Transdermal Q24H  ? metoprolol tartrate  25 mg Oral BID  ? multivitamin with minerals  1 tablet Oral Once per day on Mon Thu  ? nutrition supplement (JUVEN)  1 packet Oral BID BM  ? pantoprazole  40 mg Oral Daily  ? sodium chloride flush  3 mL Intravenous Q12H  ? zinc sulfate  220 mg Oral Daily  ? ? ? LOS: 21 days  ? ?Eulogio Bear DO ? ?If 7PM-7AM, please contact night-coverage per Amion ?02/03/2022, 2:20 PM ? ? ? ?

## 2022-02-03 NOTE — [place] FL2 (Signed)
?[place] MEDICAID FL2 LEVEL OF CARE SCREENING TOOL  ?  ? ?IDENTIFICATION  ?Patient Name: ?Alexander [name] Birthdate: 03/12/1961 Sex: male Admission Date (Current Location): ?01/13/2022  ?[place] and [place] Number: ? [place] ?  Facility and Address:  ?[facility], [place] [address], [place], [place] [address] ?     Provider Number: ?[id number]  ?Attending Physician Name and Address:  ?Geradine Girt, DO ? Relative Name and Phone Number:  ?  ?   ?Current Level of Care: ?Hospital Recommended Level of Care: ?[place] Prior Approval Number: ?  ? ?Date Approved/Denied: ?  PASRR Number: ?[id number] A ? ?Discharge Plan: ?SNF ?  ? ?Current Diagnoses: ?Patient Active Problem List  ? Diagnosis Date Noted  ? Dehiscence of amputation stump of right lower extremity (HCC)   ? Endocarditis 01/21/2022  ? Ulcer of right leg, limited to breakdown of skin ([place])   ? Below-knee amputation of right lower extremity ([place])   ? Ulcer of left foot, limited to breakdown of skin ([place])   ? History of transmetatarsal amputation of left foot ([place])   ? Lumbar back pain with radiculopathy affecting left lower extremity 01/14/2022  ? Dyslipidemia 01/14/2022  ? Type 2 diabetes mellitus with chronic kidney disease, with long-term current use of insulin ([place]) 01/14/2022  ? GERD without esophagitis 01/14/2022  ? Pressure injury of skin 01/14/2022  ? Intractable back pain 01/13/2022  ? Blister 11/08/2021  ? Severe protein-calorie malnutrition ([place]) 11/02/2021  ? Pacemaker infection ([place])   ? Transaminitis   ? Tricuspid valve vegetation   ? Thrombocytopenia ([place]) 10/26/2021  ? Hyponatremia 10/26/2021  ? Acute on chronic diastolic CHF (congestive heart failure) ([place]) 10/26/2021  ? Atrial fibrillation with RVR ([place]) 10/25/2021  ? Symptomatic anemia 09/30/2021  ? ESRD (end stage renal disease) ([place]) 09/30/2021  ? PAF (paroxysmal atrial fibrillation) ([place]) 09/30/2021  ? NSVT (nonsustained ventricular tachycardia)   ?  Pancytopenia ([place])   ? Acute blood loss anemia 07/18/2020  ? Hemodialysis patient ([place]) 06/04/2020  ? Necrotizing glomerulonephritis 06/04/2020  ? Heme positive stool   ? Melena   ? Acute GI bleeding 05/23/2020  ? Acute hematogenous osteomyelitis of right foot ([place]) 03/11/2020  ? Gangrene of right foot ([place])   ? Diabetic foot ulcer associated with type 2 diabetes mellitus ([place]) 02/19/2020  ? Heel ulcer ([place]) 11/05/2019  ? Gangrene of extremity due to atherosclerosis of artery ([place]) 09/09/2017  ? Essential hypertension, benign 02/07/2015  ? Renal insufficiency 02/07/2015  ? Post PTCA 11/09/2014  ? S/P PTCA (percutaneous transluminal coronary angioplasty) 11/09/2014  ? Pacemaker: [place] DR MRI [id number] Dual chamber pacemaker 10/08/2014 10/08/2014  ? Encounter for care of pacemaker 10/08/2014  ? Sinus node dysfunction ([place]) 10/08/2014  ? Cardiac asystole ([place]) 10/07/2014  ? Cardiac syncope 10/07/2014  ? Diabetes mellitus with stage 3 chronic kidney disease ([place]) 10/07/2014  ? CAD (coronary artery disease), native coronary artery 10/07/2014  ? Diabetes mellitus due to underlying condition without complications ([place]) 56/21/3086  ? Anemia, unspecified 05/25/2014  ? Syncope 04/30/2014  ? Atherosclerosis of native arteries of the extremities with gangrene ([place]) 12/16/2013  ? Acute renal failure ([place]) 10/28/2013  ? PVD (peripheral vascular disease) ([place]) 10/27/2013  ? Leukocytosis 10/21/2013  ? Anemia 10/21/2013  ? Osteomyelitis of left foot ([place]) 08/19/2013  ? Spinal stenosis, lumbar region, with neurogenic claudication 06/12/2012  ? Diabetes mellitus with end stage renal disease ([place]) 03/15/2009  ? ALCOHOL ABUSE 03/15/2009  ? TOBACCO USE  03/15/2009  ? HYPERTENSION 03/15/2009  ? ? ?Orientation RESPIRATION BLADDER Height & Weight   ?  ?Self, Time, Situation, Place ? Normal Incontinent Weight: 185 lb 10 oz (84.2 kg) ?Height:  6' (182.9 cm)  ?BEHAVIORAL SYMPTOMS/MOOD NEUROLOGICAL BOWEL NUTRITION STATUS  ?     Incontinent Diet (See dc summary)  ?AMBULATORY STATUS COMMUNICATION OF NEEDS Skin   ?Extensive Assist Verbally PU Stage and Appropriate Care, Surgical wounds, Wound Vac (Stage III on ischial tuberosity; Stage II on buttocks;incision on vertebral column;closed incision on leg; MASD on buttocks; Has prevena travel wound vac for 1 week) ?  ?  ?  ?    ?     ?     ? ? ?Personal Care Assistance Level of Assistance  ?Bathing, Feeding, Dressing Bathing Assistance: Limited assistance ?Feeding assistance: Independent ?Dressing Assistance: Limited assistance ?   ? ?Functional Limitations Info  ?    ?  ?   ? ? ?SPECIAL CARE FACTORS FREQUENCY  ?PT (By licensed PT), OT (By licensed OT)   ?  ?PT Frequency: 5x/week ?OT Frequency: 5x/week ?  ?  ?  ?   ? ? ?Contractures Contractures Info: Not present  ? ? ?Additional Factors Info  ?Code Status, Allergies, Insulin Sliding Scale Code Status Info: Full ?Allergies Info: Cytoxan (Cyclophosphamide), Pletal (Cilostazol), Keflex (Cephalexin), Tylenol (Acetaminophen) ?  ?Insulin Sliding Scale Info: See dc summary ?  ?   ? ?Current Medications (02/03/2022):  This is the current hospital active medication list ?Current Facility-Administered Medications  ?Medication Dose Route Frequency Provider Last Rate Last Admin  ? (feeding supplement) PROSource Plus liquid 30 mL  30 mL Oral BID BM Newt Minion, MD   30 mL at 01/31/22 1600  ? 0.9 %  sodium chloride infusion  100 mL Intravenous PRN Newt Minion, MD      ? 0.9 %  sodium chloride infusion  250 mL Intravenous PRN Newt Minion, MD      ? alum & mag hydroxide-simeth (MAALOX/MYLANTA) 200-200-20 MG/5ML suspension 15-30 mL  15-30 mL Oral Q2H PRN Newt Minion, MD      ? amoxicillin (AMOXIL) capsule 500 mg  500 mg Oral Q24H Newt Minion, MD   500 mg at 02/02/22 2234  ? apixaban (ELIQUIS) tablet 5 mg  5 mg Oral BID Newt Minion, MD   5 mg at 02/02/22 2234  ? ascorbic acid (VITAMIN C) tablet 1,000 mg  1,000 mg Oral Daily Newt Minion, MD    1,000 mg at 02/02/22 1024  ? atorvastatin (LIPITOR) tablet 20 mg  20 mg Oral QHS Newt Minion, MD   20 mg at 02/02/22 2234  ? bisacodyl (DULCOLAX) EC tablet 5 mg  5 mg Oral Daily PRN Newt Minion, MD      ? calcitRIOL (ROCALTROL) capsule 0.25 mcg  0.25 mcg Oral Daily Newt Minion, MD   0.25 mcg at 02/02/22 1024  ? Chlorhexidine Gluconate Cloth 2 % PADS 6 each  6 each Topical Q0600 Newt Minion, MD   6 each at 02/03/22 [id number]  ? Darbepoetin Alfa (ARANESP) injection 100 mcg  100 mcg Intravenous Q Thu-HD Valentina Gu, NP   100 mcg at 02/01/22 1021  ? docusate sodium (COLACE) capsule 100 mg  100 mg Oral Daily Newt Minion, MD   100 mg at 02/02/22 1024  ? doxycycline (VIBRA-TABS) tablet 100 mg  100 mg Oral Q12H Newt Minion, MD   100 mg at  02/02/22 2234  ? fentaNYL (SUBLIMAZE) injection 12.5-25 mcg  12.5-25 mcg Intravenous Q1H PRN Newt Minion, MD      ? furosemide (LASIX) tablet 80 mg  80 mg Oral Once per day on Sun Mon Wed Fri Newt Minion, MD   80 mg at 02/02/22 1024  ? guaiFENesin-dextromethorphan (ROBITUSSIN DM) 100-10 MG/5ML syrup 15 mL  15 mL Oral Q4H PRN Newt Minion, MD      ? hydrALAZINE (APRESOLINE) injection 5 mg  5 mg Intravenous Q20 Min PRN Newt Minion, MD      ? HYDROmorphone (DILAUDID) injection 0.5-1 mg  0.5-1 mg Intravenous Q4H PRN Newt Minion, MD   1 mg at 02/02/22 0524  ? HYDROmorphone (DILAUDID) tablet 2 mg  2 mg Oral Q2H PRN Newt Minion, MD   2 mg at 02/02/22 1824  ? insulin aspart (novoLOG) injection 0-6 Units  0-6 Units Subcutaneous TID WC Newt Minion, MD   1 Units at 02/02/22 1824  ? insulin aspart protamine- aspart (NOVOLOG MIX 70/30) injection 10 Units  10 Units Subcutaneous BID WC Newt Minion, MD   10 Units at 02/01/22 1814  ? labetalol (NORMODYNE) injection 10 mg  10 mg Intravenous Q10 min PRN Newt Minion, MD      ? lidocaine (LIDODERM) 5 % 1 patch  1 patch Transdermal Q24H Newt Minion, MD   1 patch at 01/20/22 2147  ? lidocaine (PF) (XYLOCAINE) 1  % injection 5 mL  5 mL Intradermal PRN Newt Minion, MD      ? lidocaine-prilocaine (EMLA) cream 1 application  1 application. Topical Q dialysis Newt Minion, MD      ? magnesium sulfate IVPB 2 g 50 mL  2 g Cecille Aver

## 2022-02-03 NOTE — Plan of Care (Signed)

## 2022-02-03 NOTE — Progress Notes (Signed)
OT Cancellation Note ? ?Patient Details ?Name: Alexander [name] ?MRN: [id number] ?DOB: 18-Feb-1961 ? ? ?Cancelled Treatment:    Reason Eval/Treat Not Completed: Patient at procedure or test/ unavailable. Will follow.  ? ?Joeseph Amor OTR/L  ?Acute Rehab Services  ?[phone] office number ?[phone] pager number ? ? ?Joeseph Amor ?02/03/2022, 7:20 AM ?

## 2022-02-03 NOTE — Progress Notes (Signed)
OT Cancellation Note ? ?Patient Details ?Name: TABB CROGHAN ?MRN: [id number] ?DOB: January 12, 1961 ? ? ?Cancelled Treatment:    Reason Eval/Treat Not Completed:  (Pt reported they wanted to try to eat.) ? ?Joeseph Amor OTR/L  ?Acute Rehab Services  ?[phone] office number ?[phone] pager number ? ?Joeseph Amor ?02/03/2022, 2:43 PM ?

## 2022-02-03 NOTE — Progress Notes (Signed)
?  02/03/22 1122  ?Vitals  ?Temp 98.5 ?F (36.9 ?C)  ?Temp Source Oral  ?BP 129/72  ?BP Location Left Arm  ?BP Method Automatic  ?Patient Position (if appropriate) Lying  ?Pulse Rate 89  ?Pulse Rate Source Monitor  ?Resp 14  ?Oxygen Therapy  ?SpO2 98 %  ?O2 Device Room Air  ?Dialysis Weight  ?Weight 82 kg  ?Type of Weight Post-Dialysis  ?Post-Hemodialysis Assessment  ?Rinseback Volume (mL) 250 mL  ?KECN 275 V  ?Dialyzer Clearance Lightly streaked  ?Duration of HD Treatment -hour(s) 4 hour(s)  ?Hemodialysis Intake (mL) 500 mL  ?UF Total -Machine (mL) 2000 mL  ?Net UF (mL) 1500 mL  ?Tolerated HD Treatment Yes  ?Post-Hemodialysis Comments tx complete, pt stable  ?AVG/AVF Arterial Site Held (minutes) 5 minutes  ?AVG/AVF Venous Site Held (minutes) 5 minutes  ?Fistula / Graft Right Upper arm Arteriovenous fistula  ?Placement Date/Time: 03/27/21 (c) [phone]   Placed prior to admission: No  Orientation: Right  Access Location: Upper arm  Access Type: (c) Arteriovenous fistula  ?Site Condition No complications  ?Fistula / Graft Assessment Present;[place]rill;Bruit  ?Status Deaccessed  ? ?HD tx complete, UF goal met. No adverse events noted.  ?

## 2022-02-04 DIAGNOSIS — M5416 Radiculopathy, lumbar region: Secondary | ICD-10-CM | POA: Diagnosis not present

## 2022-02-04 LAB — GLUCOSE, CAPILLARY
Glucose-Capillary: 107 mg/dL — ABNORMAL HIGH (ref 70–99)
Glucose-Capillary: 114 mg/dL — ABNORMAL HIGH (ref 70–99)
Glucose-Capillary: 141 mg/dL — ABNORMAL HIGH (ref 70–99)

## 2022-02-04 MED ORDER — GABAPENTIN 100 MG PO CAPS
100.0000 mg | ORAL_CAPSULE | Freq: Once | ORAL | Status: AC
  Administered 2022-02-04: 100 mg via ORAL
  Filled 2022-02-04: qty 1

## 2022-02-04 MED ORDER — NEPRO/CARBSTEADY PO LIQD
237.0000 mL | Freq: Two times a day (BID) | ORAL | Status: DC
  Administered 2022-02-07 – 2022-02-11 (×4): 237 mL via ORAL

## 2022-02-04 MED ORDER — GABAPENTIN 100 MG PO CAPS
100.0000 mg | ORAL_CAPSULE | Freq: Every day | ORAL | Status: DC
  Administered 2022-02-04 – 2022-02-05 (×2): 100 mg via ORAL
  Filled 2022-02-04 (×2): qty 1

## 2022-02-04 NOTE — Progress Notes (Signed)
? Alexander [name]  ZOX:[id number] DOB: Feb 27, 1961 DOA: 01/13/2022 ?PCP: [name], MD   ? ?Brief Narrative:  ?61 year old with a history of ESRD on HD TTS, HTN, HLD, PVD status post right BKA and left midfoot amputation, A-fib on chronic Eliquis, CAD, DM2, and pacemaker placement who presented to the ED with intractable low back pain over several days with lower extremity radiation and radiculopathy. ? ?The patient underwent MRI with aspiration of a disc space 3/10.  Initially there was a plan for the patient to go to surgery for decompression of an area of spinal stenosis, but ultimately it was felt that his risk for surgery was too high and the decision was made to cancel surgery. ? ?Now s/p amputation.  Currently agreeable for SNF.   ? ?Consultants:  ?Cardiology -Dr. [name] ?Neurosurgery ?Nephrology ?IR ?[name] ? ?Code Status: FULL CODE ? ?DVT prophylaxis: ?Eliquis  ? ?Interim Hx: ?Sleeping on HD ? ? ?Assessment & Plan: ? ?Lumbar back pain - L2-3 severe spinal stenosis - radiculopathy of left lower extremity ?CT noted moderate neuroforaminal stenosis at L2-3 and L5-S1 - MRI lumbar spine noted subacute fractures L2-L3 disc and severe multifactorial spinal stenosis at L2-3 - followed by Dr. Saintclair Halsted with Neurosurgery - surgical intervention canceled due to high risk - IR completed disc aspiration 3/10 with no evidence of infection - has completed a course of IV Decadron - goal at this time is pain management - PT/OT recommend SNF but patient refused - adjusted Neurontin for neuropathic pain 3/16 -adjusted narcotic again for low back pain 3/16 - pain has been much better controlled for the past few days -continue with attempts to fine-tune management -encouraged patient that ambulation/movement/transfers necessary ?-hold neurontin for tremors ? ?PVD with dry gangrene of right transtibial amputation site -left transmetatarsal wound poorly healing ?Followed in office by [name] - last seen as outpt 01/10/22 for  weeping from B LE wounds - placed in compression wrapping - weeping persists here in hospital - [name] saw 01/16/22 w/ plan to continue trial w/ compression wrapping - was seen in f/u again 3/16 and initially suggested B LE AKA - arterial studies however make it clear AKA not possible on L due to arterial graft - plan presently is for R AKA 3/22 and ongoing observation of the L with wound care ? ?ESRD on HD TTS ?Nephrology consulted ? ?Paroxysmal atrial fibrillation ?Continue Lopressor and Eliquis - heart rate controlled ? ?HTN ?Blood pressure well controlled ? ?Hyperlipidemia ?Continue statin ? ?DM 2 ?CBG presently at goal -continue to follow trend ? ?Chronic pacemaker infection - Severe tricuspid regurgitation with flail tricuspid valve leaflet - possible vegetation anterior mitral leaflet ?Followed by ID in outpatient setting - continue suppressive therapy with doxycycline and amoxicillin indefinitely ? ?Pressure Injury 01/14/22 Ischial tuberosity Right Stage 3 -  Full thickness tissue loss. Subcutaneous fat may be visible but bone, tendon or muscle are NOT exposed. (Active)  ?01/14/22 0321  ?Location: Ischial tuberosity  ?Location Orientation: Right  ?Staging: Stage 3 -  Full thickness tissue loss. Subcutaneous fat may be visible but bone, tendon or muscle are NOT exposed.  ?Wound Description (Comments):   ?Present on Admission: Yes  ?Dressing Type Foam - Lift dressing to assess site every shift 02/04/22 0812  ?   ?Pressure Injury 01/14/22 Buttocks Right Stage 2 -  Partial thickness loss of dermis presenting as a shallow open injury with a red, pink wound bed without slough. (Active)  ?01/14/22 0322  ?Location:  Buttocks  ?Location Orientation: Right  ?Staging: Stage 2 -  Partial thickness loss of dermis presenting as a shallow open injury with a red, pink wound bed without slough.  ?Wound Description (Comments):   ?Present on Admission: Yes  ?Dressing Type Foam - Lift dressing to assess site every shift 02/04/22  0812  ?   ?Pressure Injury 01/14/22 Buttocks Posterior;Proximal;Right Stage 2 -  Partial thickness loss of dermis presenting as a shallow open injury with a red, pink wound bed without slough. (Active)  ?01/14/22 0324  ?Location: Buttocks  ?Location Orientation: Posterior;Proximal;Right  ?Staging: Stage 2 -  Partial thickness loss of dermis presenting as a shallow open injury with a red, pink wound bed without slough.  ?Wound Description (Comments):   ?Present on Admission: Yes  ?Dressing Type Foam - Lift dressing to assess site every shift 02/04/22 0812  ? ? ?Family Communication: No family present at time of exam ?Disposition: SNF ? ?Objective: ?Blood pressure 118/76, pulse 95, temperature (!) 97.4 ?F (36.3 ?C), temperature source Oral, resp. rate 18, height 6' (1.829 m), weight 82 kg, SpO2 99 %. ? ?Intake/Output Summary (Last 24 hours) at 02/04/2022 1419 ?Last data filed at 02/04/2022 0800 ?Gross per 24 hour  ?Intake 1020 ml  ?Output 0 ml  ?Net 1020 ml  ? ?Filed Weights  ? 02/03/22 0503 02/03/22 0714 02/03/22 1122  ?Weight: 83.2 kg 84.2 kg 82 kg  ? ? ?Examination: ? ?In bed, much more engaging, willing to work with PT ?Weakness in left leg, can move but very weak ?  ?  ? ?CBC: ?Recent Labs  ?Lab 02/01/22 ?0442 02/02/22 ?0310 02/03/22 ?5427  ?WBC 11.0* 9.6 8.5  ?HGB 8.7* 9.0* 8.8*  ?HCT 28.8* 29.8* 28.9*  ?MCV 97.3 97.1 97.3  ?PLT 123* 131* PLATELET CLUMPS NOTED ON SMEAR, UNABLE TO ESTIMATE  ? ?Basic Metabolic Panel: ?Recent Labs  ?Lab 01/29/22 ?0602 01/30/22 ?0708 01/31/22 ?1142 02/01/22 ?0623 02/02/22 ?0310 02/03/22 ?0725  ?NA 130* 135   < > 134* 135 133*  ?K 5.5* 4.9   < > 4.8 3.7 3.8  ?CL 100 99   < > 100 98 96*  ?CO2 21* 26  --  '24 28 26  '$ ?GLUCOSE 122* 84   < > 227* 89 141*  ?BUN 33* 46*   < > 42* 22 32*  ?CREATININE 3.45* 3.97*   < > 3.23* 2.55* 3.39*  ?CALCIUM 8.3* 8.8*  --  8.5* 8.1* 8.1*  ?PHOS 4.6 5.3*  --   --   --  5.3*  ? < > = values in this interval not displayed.  ? ?GFR: ?Estimated Creatinine  Clearance: 24.5 mL/min (A) (by C-G formula based on SCr of 3.39 mg/dL (H)). ? ?Liver Function Tests: ?Recent Labs  ?Lab 01/29/22 ?0602 01/30/22 ?0708 02/03/22 ?0725  ?ALBUMIN 2.0* 2.1* 2.0*  ? ? ? ? ?CBG: ?Recent Labs  ?Lab 02/03/22 ?1149 02/03/22 ?1640 02/03/22 ?2043 02/04/22 ?7628 02/04/22 ?1136  ?GLUCAP 125* 177* 124* 107* 114*  ? ? ?Scheduled Meds: ? (feeding supplement) PROSource Plus  30 mL Oral BID BM  ? amoxicillin  500 mg Oral Q24H  ? apixaban  5 mg Oral BID  ? vitamin C  1,000 mg Oral Daily  ? atorvastatin  20 mg Oral QHS  ? calcitRIOL  0.25 mcg Oral Daily  ? Chlorhexidine Gluconate Cloth  6 each Topical Q0600  ? darbepoetin (ARANESP) injection - DIALYSIS  100 mcg Intravenous Q Thu-HD  ? docusate sodium  100 mg Oral Daily  ?  doxycycline  100 mg Oral Q12H  ? furosemide  80 mg Oral Once per day on Sun Mon Wed Fri  ? gabapentin  100 mg Oral QHS  ? insulin aspart  0-6 Units Subcutaneous TID WC  ? insulin aspart protamine- aspart  10 Units Subcutaneous BID WC  ? lidocaine  1 patch Transdermal Q24H  ? metoprolol tartrate  25 mg Oral BID  ? multivitamin with minerals  1 tablet Oral Once per day on Mon Thu  ? nutrition supplement (JUVEN)  1 packet Oral BID BM  ? pantoprazole  40 mg Oral Daily  ? sodium chloride flush  3 mL Intravenous Q12H  ? zinc sulfate  220 mg Oral Daily  ? ? ? LOS: 22 days  ? ?Eulogio Bear DO ? ?If 7PM-7AM, please contact night-coverage per Amion ?02/04/2022, 2:19 PM ? ? ? ?

## 2022-02-04 NOTE — Progress Notes (Signed)
?[facility] ?Progress Note  ? ?Subjective:  Seen in room - no CP or dyspnea. Remains concerned that won't regain function of his L leg, unclear. He will disc with hospitalist. ? ?Objective ?Vitals:  ? 02/03/22 2200 02/04/22 0022 02/04/22 2202 02/04/22 5427  ?BP:   124/66 118/76  ?Pulse: 99 85 90 95  ?Resp:  '18 18 18  '$ ?Temp:   97.8 ?F (36.6 ?C) (!) 97.4 ?F (36.3 ?C)  ?TempSrc:    Oral  ?SpO2:  99% 100% 99%  ?Weight:      ?Height:      ? ?Physical Exam ?General: Chronically ill appearing man, NAD, room air ?Heart: RRR; no murmur ?Lungs: CTA anteriorly ?Abdomen: soft ?Extremities: R AKA with wound vac, L leg bandaged with toe amputations ?Dialysis Access: RUE AVF + bruit ? ?Additional Objective ?Labs: ?Basic Metabolic Panel: ?Recent Labs  ?Lab 01/29/22 ?0602 01/30/22 ?0708 01/31/22 ?1142 02/01/22 ?0623 02/02/22 ?0310 02/03/22 ?0725  ?NA 130* 135   < > 134* 135 133*  ?K 5.5* 4.9   < > 4.8 3.7 3.8  ?CL 100 99   < > 100 98 96*  ?CO2 21* 26  --  '24 28 26  '$ ?GLUCOSE 122* 84   < > 227* 89 141*  ?BUN 33* 46*   < > 42* 22 32*  ?CREATININE 3.45* 3.97*   < > 3.23* 2.55* 3.39*  ?CALCIUM 8.3* 8.8*  --  8.5* 8.1* 8.1*  ?PHOS 4.6 5.3*  --   --   --  5.3*  ? < > = values in this interval not displayed.  ? ?Liver Function Tests: ?Recent Labs  ?Lab 01/29/22 ?0602 01/30/22 ?0708 02/03/22 ?0725  ?ALBUMIN 2.0* 2.1* 2.0*  ? ?CBC: ?Recent Labs  ?Lab 01/29/22 ?0602 01/30/22 ?0708 01/31/22 ?1142 02/01/22 ?7628 02/02/22 ?0310 02/03/22 ?3151  ?WBC 9.9 9.3  --  11.0* 9.6 8.5  ?HGB 10.1* 9.7*   < > 8.7* 9.0* 8.8*  ?HCT 33.4* 31.6*   < > 28.8* 29.8* 28.9*  ?MCV 96.8 97.5  --  97.3 97.1 97.3  ?PLT PLATELET CLUMPS NOTED ON SMEAR, UNABLE TO ESTIMATE 139*  --  123* 131* PLATELET CLUMPS NOTED ON SMEAR, UNABLE TO ESTIMATE  ? < > = values in this interval not displayed.  ? ?Medications: ? sodium chloride    ? sodium chloride    ? magnesium sulfate bolus IVPB    ? ? (feeding supplement) PROSource Plus  30 mL Oral BID BM  ? amoxicillin  500  mg Oral Q24H  ? apixaban  5 mg Oral BID  ? vitamin C  1,000 mg Oral Daily  ? atorvastatin  20 mg Oral QHS  ? calcitRIOL  0.25 mcg Oral Daily  ? Chlorhexidine Gluconate Cloth  6 each Topical Q0600  ? darbepoetin (ARANESP) injection - DIALYSIS  100 mcg Intravenous Q Thu-HD  ? docusate sodium  100 mg Oral Daily  ? doxycycline  100 mg Oral Q12H  ? furosemide  80 mg Oral Once per day on Sun Mon Wed Fri  ? gabapentin  100 mg Oral QHS  ? insulin aspart  0-6 Units Subcutaneous TID WC  ? insulin aspart protamine- aspart  10 Units Subcutaneous BID WC  ? lidocaine  1 patch Transdermal Q24H  ? metoprolol tartrate  25 mg Oral BID  ? multivitamin with minerals  1 tablet Oral Once per day on Mon Thu  ? nutrition supplement (JUVEN)  1 packet Oral BID BM  ? pantoprazole  40  mg Oral Daily  ? sodium chloride flush  3 mL Intravenous Q12H  ? zinc sulfate  220 mg Oral Daily  ? ? ?Dialysis Orders: ?TTS - GOC ?4hrs78mn, BFR 400, DFR 800,  EDW 94kg, 3K/ 2.5Ca No Heparin ?- Mircera 200 mcg IV q 2wks - last 01/11/22 ?- Calcitriol 0.246m PO TIW ?  ?Assessment/Plan: ?Lumbar back pain/Radiculopathy: Followed by Spine Surgery Dr. CrSaintclair HalstedMRI Lumbar Spine completed: showed fractures L2-L3 with fluid, infection v. trauma. Neurosurgeon canceled surgery secondary to high risk. L2-3 disc aspiration 01/19/2022 - culture negative. Continue pain management and Pt/OT. Eventually as outpt either will need PMD or pain clinic to manage.  ?PVD with dry gangrene s/p R AKA 01/31/2022 per Dr. DuSharol GivenPer ortho. L AKA not an option d/t bypass graft -> observing for now. ?ESRD: Continue HD per usual TTS schedule - next HD 3/28. ?Hypertension/volume: BP fine today, no overt hypervolemia. Will need much lower EDW on discharge. ?Anemia of ESRD: Hgb 8.8 - continue Aranesp 100 mcg q Thursday. Follow. ?Secondary Hyperparathyroidism: Ca/Phos at goal. Continue VDRA, no binders for now. ?Nutrition: Albumin very low. On protein supps, renal vitamins  ?DMT2: Per primary ?PPM  infection: Possible vegetation on anterior mitral leaflet. He is on chronic prophylactic antibiotics (indefinitely) - amoxicillin + doxycycline. ?A-fib on Eliquis ? ?KaVeneta PentonPA-C ?02/04/2022, 10:46 AM  ?[facility] ? ? ? ?

## 2022-02-04 NOTE — Plan of Care (Signed)

## 2022-02-05 DIAGNOSIS — M5416 Radiculopathy, lumbar region: Secondary | ICD-10-CM | POA: Diagnosis not present

## 2022-02-05 LAB — BASIC METABOLIC PANEL
Anion gap: 12 (ref 5–15)
BUN: 35 mg/dL — ABNORMAL HIGH (ref 8–23)
CO2: 27 mmol/L (ref 22–32)
Calcium: 8.5 mg/dL — ABNORMAL LOW (ref 8.9–10.3)
Chloride: 96 mmol/L — ABNORMAL LOW (ref 98–111)
Creatinine, Ser: 3.4 mg/dL — ABNORMAL HIGH (ref 0.61–1.24)
GFR, Estimated: 19 mL/min — ABNORMAL LOW (ref >60)
Glucose, Bld: 138 mg/dL — ABNORMAL HIGH (ref 70–99)
Potassium: 3.6 mmol/L (ref 3.5–5.1)
Sodium: 135 mmol/L (ref 135–145)

## 2022-02-05 LAB — GLUCOSE, CAPILLARY
Glucose-Capillary: 113 mg/dL — ABNORMAL HIGH (ref 70–99)
Glucose-Capillary: 173 mg/dL — ABNORMAL HIGH (ref 70–99)
Glucose-Capillary: 56 mg/dL — ABNORMAL LOW (ref 70–99)

## 2022-02-05 LAB — CBC
HCT: 32.9 % — ABNORMAL LOW (ref 39.0–52.0)
Hemoglobin: 10.3 g/dL — ABNORMAL LOW (ref 13.0–17.0)
MCH: 30 pg (ref 26.0–34.0)
MCHC: 31.3 g/dL (ref 30.0–36.0)
MCV: 95.9 fL (ref 80.0–100.0)
Platelets: 182 10*3/uL (ref 150–400)
RBC: 3.43 MIL/uL — ABNORMAL LOW (ref 4.22–5.81)
RDW: 16.8 % — ABNORMAL HIGH (ref 11.5–15.5)
WBC: 10.4 10*3/uL (ref 4.0–10.5)
nRBC: 0 % (ref 0.0–0.2)

## 2022-02-05 NOTE — Progress Notes (Signed)
OT Cancellation Note ? ?Patient Details ?Name: Alexander [name] ?MRN: [id number] ?DOB: [birth year] ? ? ?Cancelled Treatment:    Reason Eval/Treat Not Completed: Patient declined, no reason specified Pt declined any EOB attempts unless a second person present to assist (though noted able to complete bed mobility in recent sessions with +1 assist). ? ?[name] ?02/05/2022, 8:12 AM ?

## 2022-02-05 NOTE — Progress Notes (Addendum)
?[facility] ?Progress Note  ? ?Subjective: Seen in room. No new events over W/E. HD tomorrow on schedule.  ? ?Objective ?Vitals:  ? 02/04/22 1640 02/04/22 2020 02/05/22 0530 02/05/22 0931  ?BP: 95/83 108/68 110/65 123/62  ?Pulse: 91 95 92 93  ?Resp: '18 18 18 18  '$ ?Temp: 98.1 ?F (36.7 ?C) 97.8 ?F (36.6 ?C) 98 ?F (36.7 ?C) 98.1 ?F (36.7 ?C)  ?TempSrc: Oral Oral Oral   ?SpO2: 97% 97% 98% 100%  ?Weight:      ?Height:      ? ?Physical Exam ?General: Chronically ill appearing male in NAD.  ?Heart: S1,S2 RRR. No M/R/G ?Lungs: CTAB ?Abdomen: obese, NT. NABS ?Extremities: LLE wrapped in una boots. L toe amps. New R AKA with ACE wrap, WV present.  ?Dialysis Access: R AVF + T/B  ?  ? ? ?Additional Objective ?Labs: ?Basic Metabolic Panel: ?Recent Labs  ?Lab 01/30/22 ?0708 01/31/22 ?1142 02/02/22 ?0310 02/03/22 ?0725 02/05/22 ?7371  ?NA 135   < > 135 133* 135  ?K 4.9   < > 3.7 3.8 3.6  ?CL 99   < > 98 96* 96*  ?CO2 26   < > '28 26 27  '$ ?GLUCOSE 84   < > 89 141* 138*  ?BUN 46*   < > 22 32* 35*  ?CREATININE 3.97*   < > 2.55* 3.39* 3.40*  ?CALCIUM 8.8*   < > 8.1* 8.1* 8.5*  ?PHOS 5.3*  --   --  5.3*  --   ? < > = values in this interval not displayed.  ? ?Liver Function Tests: ?Recent Labs  ?Lab 01/30/22 ?0708 02/03/22 ?0725  ?ALBUMIN 2.1* 2.0*  ? ?No results for input(s): LIPASE, AMYLASE in the last 168 hours. ?CBC: ?Recent Labs  ?Lab 01/30/22 ?0708 01/31/22 ?1142 02/01/22 ?0442 02/02/22 ?0626 02/03/22 ?9485 02/05/22 ?4627  ?WBC 9.3  --  11.0* 9.6 8.5 10.4  ?HGB 9.7*   < > 8.7* 9.0* 8.8* 10.3*  ?HCT 31.6*   < > 28.8* 29.8* 28.9* 32.9*  ?MCV 97.5  --  97.3 97.1 97.3 95.9  ?PLT 139*  --  123* 131* PLATELET CLUMPS NOTED ON SMEAR, UNABLE TO ESTIMATE 182  ? < > = values in this interval not displayed.  ? ?Blood Culture ?   ?Component Value Date/Time  ? SDES WOUND 01/19/2022 1452  ? SPECREQUEST L3/L4 [place] ASPIRATION 01/19/2022 1452  ? CULT  01/19/2022 1452  ?  No growth aerobically or anaerobically. ?Performed at [facility], [place] [address] [place], [place] [address] ?  ? REPTSTATUS 01/24/2022 FINAL 01/19/2022 1452  ? ? ?Cardiac Enzymes: ?No results for input(s): CKTOTAL, CKMB, CKMBINDEX, TROPONINI in the last 168 hours. ?CBG: ?Recent Labs  ?Lab 02/03/22 ?2043 02/04/22 ?9381 02/04/22 ?1136 02/04/22 ?1638 02/05/22 ?8299  ?GLUCAP 124* 107* 114* 141* 113*  ? ?Iron Studies: No results for input(s): IRON, TIBC, TRANSFERRIN, FERRITIN in the last 72 hours. ?'@lablastinr3'$ @ ?Studies/Results: ?No results found. ?Medications: ? sodium chloride    ? sodium chloride    ? magnesium sulfate bolus IVPB    ? ? (feeding supplement) PROSource Plus  30 mL Oral BID BM  ? amoxicillin  500 mg Oral Q24H  ? apixaban  5 mg Oral BID  ? vitamin C  1,000 mg Oral Daily  ? atorvastatin  20 mg Oral QHS  ? calcitRIOL  0.25 mcg Oral Daily  ? Chlorhexidine Gluconate Cloth  6 each Topical Q0600  ? darbepoetin (ARANESP) injection - DIALYSIS  100 mcg Intravenous Q Thu-HD  ? docusate sodium  100 mg Oral Daily  ? doxycycline  100 mg Oral Q12H  ? feeding supplement (NEPRO CARB STEADY)  237 mL Oral BID BM  ? furosemide  80 mg Oral Once per day on Sun Mon Wed Fri  ? gabapentin  100 mg Oral QHS  ? insulin aspart  0-6 Units Subcutaneous TID WC  ? insulin aspart protamine- aspart  10 Units Subcutaneous BID WC  ? lidocaine  1 patch Transdermal Q24H  ? metoprolol tartrate  25 mg Oral BID  ? multivitamin with minerals  1 tablet Oral Once per day on Mon Thu  ? nutrition supplement (JUVEN)  1 packet Oral BID BM  ? pantoprazole  40 mg Oral Daily  ? sodium chloride flush  3 mL Intravenous Q12H  ? zinc sulfate  220 mg Oral Daily  ? ? ? ?Dialysis Orders: ?TTS - GOC ?4hrs30mn, BFR 400, DFR 800,  EDW 94kg, 3K/ 2.5Ca ?-No Heparin ?-Mircera 200 mcg IV q 2wks - last 01/11/22 ?-Calcitriol 0.279m PO TIW ?  ?  ?Assessment/Plan: ?1 Lumbar back pain/Radiculopathy: Followed by Spine Surgery Dr. CrSaintclair HalstedMR Lumbar Spine completed: showed fractures L2-L3 with fluid-? Trauma vs. Infection.  Neurosurgeon canceled surgery secondary to high risk. L2-3 disc aspiration 01/19/2022 culture negative.Continue pain management and Pt/OT. Eventually as outpt either will need PMD or pain clinic to manage.  ?2. PVD with Dry gangrene:R AKA on Wednesday 01/31/2022 per Dr. DuSharol GivenWill continue to observe LLE.  ?3. ESRD - on HD TTS. HD today on schedule. Next HD 02/06/2022. No heparin.  ?4. Hypertension/volume  -BP stable this a.m. Volume is stable. Very much under OP EDW if wt is accurate. Lower EDW on discharge. Tentatively establish 82 kg as new EDW.  UF as tolerated.  ?5. Anemia of CKD - Hgb 10.6 02/05/2022. Given Aranesp 100 mcg IV 02/01/2022. Follow HGB. Transfuse PRN.  ? 6. Secondary Hyperparathyroidism - PO4 at goal. Continue VDRA. ?7. Nutrition - Renal diet on fluid restriction. Albumin very low. On protein supps, renal vitamins  ?8. DMT2-per primary ?9. 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 ABX. Follows with cards. ? ?RiJimmye NormanBrown NP-C ?02/05/2022, 10:33 AM  ?[facility] ?[phone] ? ?  ? ?

## 2022-02-05 NOTE — Progress Notes (Signed)
Occupational Therapy Treatment ?Patient Details ?Name: Alexander [name] ?MRN: [id number] ?DOB: 12/03/1960 ?Today's Date: 02/05/2022 ? ? ?History of present illness The pt is a 61 yo male presenting 3/4 with chronic back pain and weakness to LLE in addition to frequent falls (x2 on day of admission). Now s/p R AKA on 3/22. PMH: ESRD on HD TTS, paroxysmal atrial fibrillation on Eliquis, pacemaker, CAD s/p stent, diastolic CHF, PVD with right BKA and left midfoot amputation, history of GI bleed, and DM II. ?  ?OT comments ? OT returned to see pt in conjunction with PT per pt request to progress dynamic sitting balance and transfer abilities. Pt able to complete bed mobility with one person assist with fair sitting balance. However, pt unable to tolerate sitting EOB > 5 min due to progressive back pain and requested to return to supine. Attempted to engage pt in lateral scooting at bedside though Total A x 2 required. Provided theraband with pt familiar with UE exercises and able to return demo well. Pt also noted with significant L LE weakness that pt reports is new since fall and progressive back pain. Due to extensive assist needed for OOB transfers and LB ADL completion (would be bed level if opted to return home), continue to strongly recommend postacute rehab.  ? ?Recommendations for follow up therapy are one component of a multi-disciplinary discharge planning process, led by the attending physician.  Recommendations may be updated based on patient status, additional functional criteria and insurance authorization. ?   ?Follow Up Recommendations ? Skilled nursing-short term rehab (<3 hours/day)  ?  ?Assistance Recommended at Discharge Frequent or constant Supervision/Assistance  ?Patient can return home with the following ? Two people to help with walking and/or transfers;A lot of help with bathing/dressing/bathroom ?  ?Equipment Recommendations ? Tub/shower bench  ?  ?Recommendations for Other Services   ? ?   ?Precautions / Restrictions Precautions ?Precautions: Fall ?Precaution Comments: new R AKA w/ wound vac, his of left midfoot amputation,  also with new lumbar stenosis and l2-L3 discitis ?Restrictions ?Weight Bearing Restrictions: Yes ?RLE Weight Bearing: Non weight bearing  ? ? ?  ? ?Mobility Bed Mobility ?Overal bed mobility: Needs Assistance ?Bed Mobility: Supine to Sit, Sit to Supine ?  ?  ?Supine to sit: Mod assist ?Sit to supine: Min assist ?  ?General bed mobility comments: cued to keep back straight to minimize pain; assist for trunk to EOB and assist to scoot. able to guide trunk back to bed with minguard and Min A for L LE ?  ? ?Transfers ?Overall transfer level: Needs assistance ?  ?  ?  ?  ?  ?  ?  ?  ?General transfer comment: Total A x 2 to scoot along bedside ?  ?  ?Balance Overall balance assessment: Needs assistance ?Sitting-balance support: Bilateral upper extremity supported, Feet unsupported ?Sitting balance-Leahy Scale: Fair ?Sitting balance - Comments: able to sit statically without UE support though more confident with BUE support. Dynamic balance deficits noted ?  ?  ?  ?  ?  ?  ?  ?  ?  ?  ?  ?  ?  ?  ?  ?   ? ?ADL either performed or assessed with clinical judgement  ? ?ADL Overall ADL's : Needs assistance/impaired ?  ?  ?  ?  ?  ?  ?  ?  ?  ?  ?  ?  ?  ?  ?  ?  ?  ?  ?  ?  General ADL Comments: Planned to progress sitting balance EOB though pt unable to tolerate EOB >5 min d/t back pain and requested to lay back down. Educated on UE HEP with pt already familiar with UB exercises (declined need for handout). Pt reported he would likely need to have a BM soon; reported he "just goes on the pad" as he cannot tolerate being on bedpan d/t back pain ?  ? ?Extremity/Trunk Assessment Upper Extremity Assessment ?Upper Extremity Assessment: Overall WFL for tasks assessed ?  ?Lower Extremity Assessment ?Lower Extremity Assessment: Defer to PT evaluation ?  ?  ?  ? ?Vision   ?Vision Assessment?: No  apparent visual deficits ?  ?Perception   ?  ?Praxis   ?  ? ?Cognition Arousal/Alertness: Awake/alert ?Behavior During Therapy: [facility] for tasks assessed/performed ?Overall Cognitive Status: Within Functional Limits for tasks assessed ?  ?  ?  ?  ?  ?  ?  ?  ?  ?  ?  ?  ?  ?  ?  ?  ?General Comments: self limiting and has a fear of falling ?  ?  ?   ?Exercises Exercises: General Upper Extremity ?General Exercises - Upper Extremity ?Shoulder Flexion: Strengthening, Both, 5 reps, Theraband ?Theraband Level (Shoulder Flexion): Level 3 (Green) ?Shoulder Horizontal ABduction: Strengthening, Both, 5 reps, Theraband ?Theraband Level (Shoulder Horizontal Abduction): Level 3 (Green) ?Elbow Flexion: Strengthening, Both, 5 reps, Theraband ?Theraband Level (Elbow Flexion): Level 3 (Green) ?Elbow Extension: Strengthening, Both, 5 reps, Theraband ?Theraband Level (Elbow Extension): Level 3 (Green) ? ?  ?Shoulder Instructions   ? ? ?  ?General Comments    ? ? ?Pertinent Vitals/ Pain       Pain Assessment ?Pain Assessment: Faces ?Faces Pain Scale: Hurts even more ?Pain Location: back ?Pain Descriptors / Indicators: Grimacing, Guarding ?Pain Intervention(s): Monitored during session, Limited activity within patient's tolerance, Premedicated before session, Repositioned ? ?Home Living   ?  ?  ?  ?  ?  ?  ?  ?  ?  ?  ?  ?  ?  ?  ?  ?  ?  ?  ? ?  ?Prior Functioning/Environment    ?  ?  ?  ?   ? ?Frequency ? Min 2X/week  ? ? ? ? ?  ?Progress Toward Goals ? ?OT Goals(current goals can now be found in the care plan section) ? Progress towards OT goals: Progressing toward goals ? ?Acute Rehab OT Goals ?Patient Stated Goal: decrease back pain, improve L LE strength ?OT Goal Formulation: With patient ?Time For Goal Achievement: 02/15/22 ?Potential to Achieve Goals: Good ?ADL Goals ?Pt Will Perform Grooming: sitting;with set-up ?Pt Will Perform Upper Body Bathing: with min guard assist;sitting ?Pt Will Perform Lower Body Bathing: with min  assist ?Pt Will Perform Upper Body Dressing: with min guard assist;sitting ?Pt Will Perform Lower Body Dressing: with mod assist ?Pt Will Transfer to Toilet: with mod assist;anterior/posterior transfer;with transfer board;bedside commode ?Pt Will Perform Toileting - Clothing Manipulation and hygiene: with mod assist;sitting/lateral leans  ?Plan Discharge plan needs to be updated   ? ?Co-evaluation ? ? ? PT/OT/SLP Co-Evaluation/Treatment: Yes ?Reason for Co-Treatment: To address functional/ADL transfers;For patient/therapist safety ?  ?OT goals addressed during session: ADL's and self-care;Strengthening/ROM ?  ? ?  ?AM-PAC OT "6 Clicks" Daily Activity     ?Outcome Measure ? ? Help from another person eating meals?: None ?Help from another person taking care of personal grooming?: A Little ?Help from another person toileting, which  includes using toliet, bedpan, or urinal?: Total ?Help from another person bathing (including washing, rinsing, drying)?: Total ?Help from another person to put on and taking off regular upper body clothing?: A Little ?Help from another person to put on and taking off regular lower body clothing?: Total ?6 Click Score: 13 ? ?  ?End of Session   ? ?OT Visit Diagnosis: Muscle weakness (generalized) (M62.81);Pain ?Pain - part of body:  (back) ?  ?Activity Tolerance Patient limited by pain ?  ?Patient Left in bed;with call bell/phone within reach;with bed alarm set ?  ?Nurse Communication Mobility status ?  ? ?   ? ?Time: 3818-4037 ?OT Time Calculation (min): 31 min ? ?Charges: OT General Charges ?$OT Visit: 1 Visit ?OT Treatments ?$Therapeutic Activity: 8-22 mins ? ?Malachy Chamber, OTR/L ?Acute Rehab Services ?Office: [phone]  ? ?Alexander [name] ?02/05/2022, 12:11 PM ?

## 2022-02-05 NOTE — Progress Notes (Signed)
? Alexander [name]  ZOX:[id number] DOB: 03-02-1961 DOA: 01/13/2022 ?PCP: [name], MD   ? ? ?Brief Narrative:  ?61 year old with a history of ESRD on HD TTS, HTN, HLD, PVD status post right BKA and left midfoot amputation, A-fib on chronic Eliquis, CAD, DM2, and pacemaker placement who presented to the ED with intractable low back pain over several days with lower extremity radiation and radiculopathy. ? ?The patient underwent MRI with aspiration of a disc space 3/10.  Initially there was a plan for the patient to go to surgery for decompression of an area of spinal stenosis, but ultimately it was felt that his risk for surgery was too high and the decision was made to cancel surgery. ? ?Now s/p amputation.  Was agreeable for SNF but today not.  Family not able to take home.  Will get palliative care for [place].  Not making progress in mobility.    ? ?Consultants:  ?Cardiology -Dr. [name] ?Neurosurgery ?Nephrology ?IR ?[name] ? ?Code Status: FULL CODE ? ?DVT prophylaxis: ?Eliquis  ? ?Interim Hx: ?In bed, agreeable to work with OT-- would like some bands to use in the bed ? ? ?Assessment & Plan: ? ?Lumbar back pain - L2-3 severe spinal stenosis - radiculopathy of left lower extremity ?CT noted moderate neuroforaminal stenosis at L2-3 and L5-S1 - MRI lumbar spine noted subacute fractures L2-L3 disc and severe multifactorial spinal stenosis at L2-3 - followed by Dr. Saintclair Halsted with Neurosurgery - surgical intervention canceled due to high risk - IR completed disc aspiration 3/10 with no evidence of infection - has completed a course of IV Decadron - goal at this time is pain management - PT/OT recommend SNF but patient refused - adjusted Neurontin for neuropathic pain 3/16 -adjusted narcotic again for low back pain 3/16 - pain has been much better controlled for the past few days -continue with attempts to fine-tune management -encouraged patient that ambulation/movement/transfers necessary ?-resume neurontin at lower  dose- increase as able to max of 300 daily as BID caused tremors ?-palliative care consult-- left leg continues to get weaker and patient has been refusing PT/OT at times ? ?PVD with dry gangrene of right transtibial amputation site -left transmetatarsal wound poorly healing ?Followed in office by [name] - last seen as outpt 01/10/22 for weeping from B LE wounds - placed in compression wrapping - weeping persists here in hospital - [name] saw 01/16/22 w/ plan to continue trial w/ compression wrapping - was seen in f/u again 3/16 and initially suggested B LE AKA - arterial studies however make it clear AKA not possible on L due to arterial graft - plan presently is for R AKA 3/22 and ongoing observation of the L with wound care ? ?ESRD on HD TTS ?Nephrology consulted ? ?Paroxysmal atrial fibrillation ?Continue Lopressor and Eliquis - heart rate controlled ? ?HTN ?Blood pressure well controlled ? ?Hyperlipidemia ?Continue statin ? ?DM 2 ?CBG presently at goal -continue to follow trend ? ?Chronic pacemaker infection - Severe tricuspid regurgitation with flail tricuspid valve leaflet - possible vegetation anterior mitral leaflet ?Followed by ID in outpatient setting - continue suppressive therapy with doxycycline and amoxicillin indefinitely ? ?Pressure Injury 01/14/22 Ischial tuberosity Right Stage 3 -  Full thickness tissue loss. Subcutaneous fat may be visible but bone, tendon or muscle are NOT exposed. (Active)  ?01/14/22 0321  ?Location: Ischial tuberosity  ?Location Orientation: Right  ?Staging: Stage 3 -  Full thickness tissue loss. Subcutaneous fat may be visible but bone,  tendon or muscle are NOT exposed.  ?Wound Description (Comments):   ?Present on Admission: Yes  ?Dressing Type Foam - Lift dressing to assess site every shift 02/04/22 2000  ?   ?Pressure Injury 01/14/22 Buttocks Right Stage 2 -  Partial thickness loss of dermis presenting as a shallow open injury with a red, pink wound bed without slough.  (Active)  ?01/14/22 0322  ?Location: Buttocks  ?Location Orientation: Right  ?Staging: Stage 2 -  Partial thickness loss of dermis presenting as a shallow open injury with a red, pink wound bed without slough.  ?Wound Description (Comments):   ?Present on Admission: Yes  ?Dressing Type Foam - Lift dressing to assess site every shift 02/04/22 2000  ?   ?Pressure Injury 01/14/22 Buttocks Posterior;Proximal;Right Stage 2 -  Partial thickness loss of dermis presenting as a shallow open injury with a red, pink wound bed without slough. (Active)  ?01/14/22 0324  ?Location: Buttocks  ?Location Orientation: Posterior;Proximal;Right  ?Staging: Stage 2 -  Partial thickness loss of dermis presenting as a shallow open injury with a red, pink wound bed without slough.  ?Wound Description (Comments):   ?Present on Admission: Yes  ?Dressing Type Foam - Lift dressing to assess site every shift 02/04/22 2000  ? ? ?Family Communication: No family present at time of exam ?Disposition: SNF ? ?Objective: ?Blood pressure 123/62, pulse 93, temperature 98.1 ?F (36.7 ?C), resp. rate 18, height 6' (1.829 m), weight 82 kg, SpO2 100 %. ? ?Intake/Output Summary (Last 24 hours) at 02/05/2022 1330 ?Last data filed at 02/04/2022 1700 ?Gross per 24 hour  ?Intake 300 ml  ?Output --  ?Net 300 ml  ? ?Filed Weights  ? 02/03/22 0503 02/03/22 0714 02/03/22 1122  ?Weight: 83.2 kg 84.2 kg 82 kg  ? ? ?Examination: ? ? ?General: Appearance:    Chronically ill appearing male in no acute distress  ?   ?Lungs:     respirations unlabored  ?Heart:    Normal heart rate.   ?MS:   Left leg weak- patient can move but take effort, right leg with wound vac on stump  ?Neurologic:   Awake, alert, poor insight  ?  ?  ?  ? ?CBC: ?Recent Labs  ?Lab 02/02/22 ?8413 02/03/22 ?2440 02/05/22 ?1027  ?WBC 9.6 8.5 10.4  ?HGB 9.0* 8.8* 10.3*  ?HCT 29.8* 28.9* 32.9*  ?MCV 97.1 97.3 95.9  ?PLT 131* PLATELET CLUMPS NOTED ON SMEAR, UNABLE TO ESTIMATE 182  ? ?Basic Metabolic Panel: ?Recent  Labs  ?Lab 01/30/22 ?0708 01/31/22 ?1142 02/02/22 ?0310 02/03/22 ?0725 02/05/22 ?2536  ?NA 135   < > 135 133* 135  ?K 4.9   < > 3.7 3.8 3.6  ?CL 99   < > 98 96* 96*  ?CO2 26   < > '28 26 27  '$ ?GLUCOSE 84   < > 89 141* 138*  ?BUN 46*   < > 22 32* 35*  ?CREATININE 3.97*   < > 2.55* 3.39* 3.40*  ?CALCIUM 8.8*   < > 8.1* 8.1* 8.5*  ?PHOS 5.3*  --   --  5.3*  --   ? < > = values in this interval not displayed.  ? ?GFR: ?Estimated Creatinine Clearance: 24.4 mL/min (A) (by C-G formula based on SCr of 3.4 mg/dL (H)). ? ?Liver Function Tests: ?Recent Labs  ?Lab 01/30/22 ?0708 02/03/22 ?0725  ?ALBUMIN 2.1* 2.0*  ? ? ? ? ?CBG: ?Recent Labs  ?Lab 02/03/22 ?2043 02/04/22 ?6440 02/04/22 ?1136 02/04/22 ?1638 02/05/22 ?  [place] ?Scheduled Meds: ? (feeding supplement) PROSource Plus  30 mL Oral BID BM  ? amoxicillin  500 mg Oral Q24H  ? apixaban  5 mg Oral BID  ? vitamin C  1,000 mg Oral Daily  ? atorvastatin  20 mg Oral QHS  ? calcitRIOL  0.25 mcg Oral Daily  ? Chlorhexidine Gluconate Cloth  6 each Topical Q0600  ? darbepoetin (ARANESP) injection - DIALYSIS  100 mcg Intravenous Q Thu-HD  ? docusate sodium  100 mg Oral Daily  ? doxycycline  100 mg Oral Q12H  ? feeding supplement (NEPRO CARB STEADY)  237 mL Oral BID BM  ? furosemide  80 mg Oral Once per day on Sun Mon Wed Fri  ? gabapentin  100 mg Oral QHS  ? insulin aspart  0-6 Units Subcutaneous TID WC  ? insulin aspart protamine- aspart  10 Units Subcutaneous BID WC  ? lidocaine  1 patch Transdermal Q24H  ? metoprolol tartrate  25 mg Oral BID  ? multivitamin with minerals  1 tablet Oral Once per day on Mon Thu  ? nutrition supplement (JUVEN)  1 packet Oral BID BM  ? pantoprazole  40 mg Oral Daily  ? sodium chloride flush  3 mL Intravenous Q12H  ? zinc sulfate  220 mg Oral Daily  ? ? ? LOS: 23 days  ? ?Eulogio Bear DO ? ?If 7PM-7AM, please contact night-coverage per Amion ?02/05/2022, 1:30 PM ? ? ? ?

## 2022-02-05 NOTE — TOC Progression Note (Signed)
Transition of Care (TOC) - Initial/Assessment Note  ? ? ?Patient Details  ?Name: Alexander [name] ?MRN: [id number] ?Date of Birth: 1961/08/07 ? ?Transition of Care (TOC) CM/SW Contact:    ?Paulene Floor Annaleise Burger, LCSWA ?Phone Number: ?02/05/2022, 11:24 AM ? ?Clinical Narrative:                 ?CSW met with the patient at bedside to present bed offers.  The patient reports that he is not willing to go to SNF.  The patient stated that he agreed last week, but had time to think about it and is not willing to go.   ? ?CSW attempted to call the patient's spouse to discuss discharge planning for the patient and there was no answer.  CSW left a VM requesting a returned call. ? ?CSW spoke with AIR at [facility] and was informed that insurance is unlikely to approve the patient.   ? ?Expected Discharge Plan: Home/Self Care ?Barriers to Discharge: Continued Medical Work up ? ? ?Patient Goals and CMS Choice ?Patient states their goals for this hospitalization and ongoing recovery are:: Pt states he would like to return home after hospitalization. ?CMS Medicare.gov Compare Post Acute Care list provided to:: Patient ?Choice offered to / list presented to : Patient ? ?Expected Discharge Plan and Services ?Expected Discharge Plan: Home/Self Care ?  ?  ?Post Acute Care Choice: Resumption of Svcs/PTA Provider ?Living arrangements for the past 2 months: [place] ?                ?  ?  ?  ?  ?  ?  ?  ?  ?  ?  ? ?Prior Living Arrangements/Services ?Living arrangements for the past 2 months: [place] ?Lives with:: Adult Children, Spouse ?Patient language and need for interpreter reviewed:: Yes ?Do you feel safe going back to the place where you live?: Yes      ?Need for Family Participation in Patient Care: Yes (Comment) ?Care giver support system in place?: Yes (comment) ?Current home services: DME ?Criminal Activity/Legal Involvement Pertinent to Current Situation/Hospitalization: No - Comment as needed ? ?Activities of Daily  Living ?Home Assistive Devices/Equipment: CBG Meter ?ADL Screening (condition at time of admission) ?Patient's cognitive ability adequate to safely complete daily activities?: Yes ?Is the patient deaf or have difficulty hearing?: No ?Does the patient have difficulty seeing, even when wearing glasses/contacts?: No ?Does the patient have difficulty concentrating, remembering, or making decisions?: No ?Patient able to express need for assistance with ADLs?: Yes ?Does the patient have difficulty dressing or bathing?: Yes ?Independently performs ADLs?: No ?Communication: Independent ?Dressing (OT): Needs assistance ?Is this a change from baseline?: Pre-admission baseline ?Grooming: Independent ?Feeding: Independent ?Bathing: Needs assistance ?Is this a change from baseline?: Change from baseline, expected to last >3 days ?Toileting: Needs assistance ?Is this a change from baseline?: Pre-admission baseline ?In/Out Bed: Needs assistance ?Is this a change from baseline?: Pre-admission baseline ?Does the patient have difficulty walking or climbing stairs?: Yes ?Weakness of Legs: Both ?Weakness of Arms/Hands: Both ? ?Permission Sought/Granted ?Permission sought to share information with : Family Supports ?Permission granted to share information with : No ?   ?   ?   ?   ? ?Emotional Assessment ?Appearance:: Appears stated age ?Attitude/Demeanor/Rapport: Engaged ?Affect (typically observed): Appropriate ?Orientation: : Oriented to Self, Oriented to Place, Oriented to  Time, Oriented to Situation ?Alcohol / Substance Use: Not Applicable ?Psych Involvement: No (comment) ? ?Admission diagnosis:  Intractable back pain [  M54.9] ?Patient Active Problem List  ? Diagnosis Date Noted  ? Dehiscence of amputation stump of right lower extremity (HCC)   ? Endocarditis 01/21/2022  ? Ulcer of right leg, limited to breakdown of skin ([place])   ? Below-knee amputation of right lower extremity ([place])   ? Ulcer of left foot, limited to breakdown of  skin ([place])   ? History of transmetatarsal amputation of left foot ([place])   ? Lumbar back pain with radiculopathy affecting left lower extremity 01/14/2022  ? Dyslipidemia 01/14/2022  ? Type 2 diabetes mellitus with chronic kidney disease, with long-term current use of insulin ([place]) 01/14/2022  ? GERD without esophagitis 01/14/2022  ? Pressure injury of skin 01/14/2022  ? Intractable back pain 01/13/2022  ? Blister 11/08/2021  ? Severe protein-calorie malnutrition ([place]) 11/02/2021  ? Pacemaker infection ([place])   ? Transaminitis   ? Tricuspid valve vegetation   ? Thrombocytopenia ([place]) 10/26/2021  ? Hyponatremia 10/26/2021  ? Acute on chronic diastolic CHF (congestive heart failure) ([place]) 10/26/2021  ? Atrial fibrillation with RVR ([place]) 10/25/2021  ? Symptomatic anemia 09/30/2021  ? ESRD (end stage renal disease) ([place]) 09/30/2021  ? PAF (paroxysmal atrial fibrillation) ([place]) 09/30/2021  ? NSVT (nonsustained ventricular tachycardia)   ? Pancytopenia ([place])   ? Acute blood loss anemia 07/18/2020  ? Hemodialysis patient ([place]) 06/04/2020  ? Necrotizing glomerulonephritis 06/04/2020  ? Heme positive stool   ? Melena   ? Acute GI bleeding 05/23/2020  ? Acute hematogenous osteomyelitis of right foot ([place]) 03/11/2020  ? Gangrene of right foot ([place])   ? Diabetic foot ulcer associated with type 2 diabetes mellitus ([place]) 02/19/2020  ? Heel ulcer ([place]) 11/05/2019  ? Gangrene of extremity due to atherosclerosis of artery ([place]) 09/09/2017  ? Essential hypertension, benign 02/07/2015  ? Renal insufficiency 02/07/2015  ? Post PTCA 11/09/2014  ? S/P PTCA (percutaneous transluminal coronary angioplasty) 11/09/2014  ? Pacemaker: Browns DR MRI [id number] Dual chamber pacemaker 10/08/2014 10/08/2014  ? Encounter for care of pacemaker 10/08/2014  ? Sinus node dysfunction ([place]) 10/08/2014  ? Cardiac asystole ([place]) 10/07/2014  ? Cardiac syncope 10/07/2014  ? Diabetes mellitus with stage 3 chronic kidney disease ([place]) 10/07/2014  ? CAD  (coronary artery disease), native coronary artery 10/07/2014  ? Diabetes mellitus due to underlying condition without complications ([place]) 77/41/2878  ? Anemia, unspecified 05/25/2014  ? Syncope 04/30/2014  ? Atherosclerosis of native arteries of the extremities with gangrene ([place]) 12/16/2013  ? Acute renal failure ([place]) 10/28/2013  ? PVD (peripheral vascular disease) ([place]) 10/27/2013  ? Leukocytosis 10/21/2013  ? Anemia 10/21/2013  ? Osteomyelitis of left foot ([place]) 08/19/2013  ? Spinal stenosis, lumbar region, with neurogenic claudication 06/12/2012  ?  Class: Diagnosis of  ? Diabetes mellitus with end stage renal disease ([place]) 03/15/2009  ? ALCOHOL ABUSE 03/15/2009  ? TOBACCO USE 03/15/2009  ? HYPERTENSION 03/15/2009  ? ?PCP:  [name], MD ?Pharmacy:   ?[facility], [place] [address] ?[place] [address] ?Phone: [phone] Fax: [phone] ? ?[id number] [place] [place] - [id number]?Phone: [phone]: [phone]? ?[facility] ?[address]?Phone: [phone]: [phone] ? ? ? ? ?Social Determinants of Health (SDOH) Interventions ?  ? ?Readmission Risk Interventions ? ?  10/27/2021  ?  3:51 PM 05/26/2020  ?  1:00 PM 03/15/2020  ?  4:52 PM  ?Readmission Risk Prevention Plan  ?Transportation Screening Complete Complete Complete  ?PCP or Specialist Appt within 5-7 Days   Complete  ?PCP or Specialist Appt within 3-5 Days Complete    ?Home Care Screening   Complete  ?Medication Review (RN CM)   Complete  ?[place] or Home Care Consult Complete    ?Social Work Consult for [place] Planning/Counseling Complete    ?Palliative Care Screening Not Applicable    ?Medication Review Press photographer) Complete Complete   ?PCP or Specialist appointment within 3-5 days of discharge  Complete   ?[place] or Home Care Consult   Complete   ?SW Recovery Care/Counseling Consult  Complete   ?Palliative Care Screening  Not Applicable   ?[place]  Not Applicable   ? ? ? ?

## 2022-02-05 NOTE — Progress Notes (Signed)
Physical Therapy Treatment ?Patient Details ?Name: Alexander [name] ?MRN: [id number] ?DOB: 1961-08-16 ?Today's Date: 02/05/2022 ? ? ?History of Present Illness The pt is a 61 yo male presenting 3/4 with chronic back pain and weakness to LLE in addition to frequent falls (x2 on day of admission). Pt found to have severe L2-L3 stenosis but surgery was too high risk. Now s/p R AKA on 3/22. PMH: ESRD on HD TTS, paroxysmal atrial fibrillation on Eliquis, pacemaker, CAD s/p stent, diastolic CHF, PVD with right BKA and left midfoot amputation, history of GI bleed, and DM II. ? ?  ?PT Comments  ? ? Pt remains significantly limited by his back pain and LLE weakness. Not tolerating much activity and with self limiting actions. Continue to recommend SNF.    ?Recommendations for follow up therapy are one component of a multi-disciplinary discharge planning process, led by the attending physician.  Recommendations may be updated based on patient status, additional functional criteria and insurance authorization. ? ?Follow Up Recommendations ? Skilled nursing-short term rehab (<3 hours/day) ?  ?  ?Assistance Recommended at Discharge Frequent or constant Supervision/Assistance  ?Patient can return home with the following Two people to help with walking and/or transfers;Help with stairs or ramp for entrance ?  ?Equipment Recommendations ? Hospital bed;Other (comment) (hoyer lift)  ?  ?Recommendations for Other Services   ? ? ?  ?Precautions / Restrictions Precautions ?Precautions: Fall ?Precaution Comments: new R AKA w/ wound vac, hx of left midfoot amputation,  also with new lumbar stenosis ?Restrictions ?Weight Bearing Restrictions: Yes ?RLE Weight Bearing: Non weight bearing  ?  ? ?Mobility ? Bed Mobility ?Overal bed mobility: Needs Assistance ?Bed Mobility: Supine to Sit, Sit to Supine ?  ?  ?Supine to sit: Mod assist, +2 for safety/equipment ?Sit to supine: Min assist, +2 for safety/equipment ?  ?General bed mobility comments: Pt  wants 2 people present for any mobility. Cued to keep back straight to minimize pain; Assist to bring legs off of bed, elevate trunk into sitting and bring hips to EOB. Able to guide trunk back to bed with minguard and Min A for L LE ?  ? ?Transfers ?Overall transfer level: Needs assistance ?  ?  ?  ?  ?  ?  ?  ?  ?General transfer comment: Total A x 2 to scoot along bedside ?  ? ?Ambulation/Gait ?  ?  ?  ?  ?  ?  ?  ?  ? ? ?Stairs ?  ?  ?  ?  ?  ? ? ?Wheelchair Mobility ?  ? ?Modified Rankin (Stroke Patients Only) ?  ? ? ?  ?Balance Overall balance assessment: Needs assistance ?Sitting-balance support: Bilateral upper extremity supported, Feet unsupported ?Sitting balance-Leahy Scale: Fair ?Sitting balance - Comments: able to sit statically without UE support though more confident with BUE support. Dynamic balance deficits noted. Pt only tolerated sitting EOB x 5 minutes prior to requesting to lie back down due to back pain. ?  ?  ?  ?  ?  ?  ?  ?  ?  ?  ?  ?  ?  ?  ?  ?  ? ?  ?Cognition Arousal/Alertness: Awake/alert ?Behavior During Therapy: [facility] for tasks assessed/performed ?Overall Cognitive Status: Within Functional Limits for tasks assessed ?  ?  ?  ?  ?  ?  ?  ?  ?  ?  ?  ?  ?  ?  ?  ?  ?General Comments:  self limiting and has a fear of falling ?  ?  ? ?  ?Exercises General Exercises - Lower Extremity ?Ankle Circles/Pumps: PROM, Left, 10 reps, Supine ?Quad Sets: AAROM, Left, 10 reps, Supine ?Short Arc Quad: AAROM, Left, 10 reps, Supine ?Heel Slides: AAROM, Left, 10 reps, Supine ?Hip ABduction/ADduction: AAROM, Left, 10 reps, Supine ?Straight Leg Raises: AAROM, Left, 10 reps, Supine ? ?  ?General Comments   ?  ?  ? ?Pertinent Vitals/Pain Pain Assessment ?Pain Assessment: Faces ?Faces Pain Scale: Hurts even more ?Pain Location: back ?Pain Descriptors / Indicators: Grimacing, Guarding ?Pain Intervention(s): Limited activity within patient's tolerance, Monitored during session, Repositioned  ? ? ?Home Living   ?   ?  ?  ?  ?  ?  ?  ?  ?  ?   ?  ?Prior Function    ?  ?  ?   ? ?PT Goals (current goals can now be found in the care plan section) Progress towards PT goals: Not progressing toward goals - comment ? ?  ?Frequency ? ? ? Min 3X/week ? ? ? ?  ?PT Plan Current plan remains appropriate  ? ? ?Co-evaluation PT/OT/SLP Co-Evaluation/Treatment: Yes ?Reason for Co-Treatment: To address functional/ADL transfers;For patient/therapist safety ?PT goals addressed during session: Mobility/safety with mobility;Balance ?OT goals addressed during session: ADL's and self-care;Strengthening/ROM ?  ? ?  ?AM-PAC PT "6 Clicks" Mobility   ?Outcome Measure ? Help needed turning from your back to your side while in a flat bed without using bedrails?: A Lot ?Help needed moving from lying on your back to sitting on the side of a flat bed without using bedrails?: A Lot ?Help needed moving to and from a bed to a chair (including a wheelchair)?: Total ?Help needed standing up from a chair using your arms (e.g., wheelchair or bedside chair)?: Total ?Help needed to walk in hospital room?: Total ?Help needed climbing 3-5 steps with a railing? : Total ?6 Click Score: 8 ? ?  ?End of Session   ?Activity Tolerance: Patient limited by pain ?Patient left: in bed;with call bell/phone within reach;with bed alarm set ?  ?PT Visit Diagnosis: Pain;Unsteadiness on feet (R26.81);Repeated falls (R29.6) ?Pain - part of body:  (back) ?  ? ? ?Time: 9509-3267 ?PT Time Calculation (min) (ACUTE ONLY): 28 min ? ?Charges:  $Therapeutic Activity: 8-22 mins          ?          ? ?[facility] PT ?Acute Rehabilitation Services ?Pager [phone] ?Office [phone] ? ? ? ?Shary Decamp [facility] ?02/05/2022, 2:01 PM ? ?

## 2022-02-06 DIAGNOSIS — E43 Unspecified severe protein-calorie malnutrition: Secondary | ICD-10-CM | POA: Diagnosis not present

## 2022-02-06 DIAGNOSIS — N186 End stage renal disease: Secondary | ICD-10-CM | POA: Diagnosis not present

## 2022-02-06 DIAGNOSIS — Z7189 Other specified counseling: Secondary | ICD-10-CM

## 2022-02-06 DIAGNOSIS — S88111A Complete traumatic amputation at level between knee and ankle, right lower leg, initial encounter: Secondary | ICD-10-CM | POA: Diagnosis not present

## 2022-02-06 DIAGNOSIS — M5416 Radiculopathy, lumbar region: Secondary | ICD-10-CM | POA: Diagnosis not present

## 2022-02-06 DIAGNOSIS — Z515 Encounter for palliative care: Secondary | ICD-10-CM

## 2022-02-06 LAB — GLUCOSE, CAPILLARY
Glucose-Capillary: 120 mg/dL — ABNORMAL HIGH (ref 70–99)
Glucose-Capillary: 126 mg/dL — ABNORMAL HIGH (ref 70–99)
Glucose-Capillary: 111 mg/dL — ABNORMAL HIGH (ref 70–99)
Glucose-Capillary: 109 mg/dL — ABNORMAL HIGH (ref 70–99)
Glucose-Capillary: 122 mg/dL — ABNORMAL HIGH (ref 70–99)

## 2022-02-06 LAB — RENAL FUNCTION PANEL
Albumin: 2.1 g/dL — ABNORMAL LOW (ref 3.5–5.0)
Anion gap: 13 (ref 5–15)
BUN: 41 mg/dL — ABNORMAL HIGH (ref 8–23)
CO2: 25 mmol/L (ref 22–32)
Calcium: 8.6 mg/dL — ABNORMAL LOW (ref 8.9–10.3)
Chloride: 98 mmol/L (ref 98–111)
Creatinine, Ser: 3.58 mg/dL — ABNORMAL HIGH (ref 0.61–1.24)
GFR, Estimated: 18 mL/min — ABNORMAL LOW (ref >60)
Glucose, Bld: 141 mg/dL — ABNORMAL HIGH (ref 70–99)
Phosphorus: 5.5 mg/dL — ABNORMAL HIGH (ref 2.5–4.6)
Potassium: 3.6 mmol/L (ref 3.5–5.1)
Sodium: 136 mmol/L (ref 135–145)

## 2022-02-06 LAB — CBC
HCT: 33.3 % — ABNORMAL LOW (ref 39.0–52.0)
Hemoglobin: 10.3 g/dL — ABNORMAL LOW (ref 13.0–17.0)
MCH: 29.4 pg (ref 26.0–34.0)
MCHC: 30.9 g/dL (ref 30.0–36.0)
MCV: 95.1 fL (ref 80.0–100.0)
Platelets: 204 10*3/uL (ref 150–400)
RBC: 3.5 MIL/uL — ABNORMAL LOW (ref 4.22–5.81)
RDW: 16.7 % — ABNORMAL HIGH (ref 11.5–15.5)
WBC: 10.5 10*3/uL (ref 4.0–10.5)
nRBC: 0 % (ref 0.0–0.2)

## 2022-02-06 MED ORDER — GABAPENTIN 100 MG PO CAPS
200.0000 mg | ORAL_CAPSULE | Freq: Every day | ORAL | Status: DC
  Administered 2022-02-06 – 2022-02-14 (×9): 200 mg via ORAL
  Filled 2022-02-06 (×9): qty 2

## 2022-02-06 MED ORDER — SODIUM CHLORIDE 0.9 % IV SOLN: 100.0000 mL | INTRAVENOUS | Status: DC | PRN

## 2022-02-06 MED ORDER — ALTEPLASE 2 MG IJ SOLR: 2.0000 mg | Freq: Once | INTRAMUSCULAR | Status: DC | PRN

## 2022-02-06 NOTE — Consult Note (Signed)
? ?                                                                                ?Consultation Note ?Date: 02/06/2022  ? ?Patient Name: Alexander [name]  ?DOB: 1961-06-27  MRN: [id number]  Age / Sex: 61 y.o., male  ?PCP: [name], MD ?Referring Physician: Geradine Girt, DO ? ?Reason for Consultation: Establishing goals of care ? ?HPI/Patient Profile: 61 y.o. male  with past medical history of ESRD on HD TTS, HTN, HLD, PVD status post right BKA and left midfoot amputation, A-fib on chronic Eliquis, CAD, DM2, and pacemaker placement  admitted on 01/13/2022 with intractable low back pain over several days with lower extremity radiation and radiculopathy.  ? ?MRI lumbar spine noted subacute fractures L2-L3 disc and severe multifactorial spinal stenosis. Initially there was a plan for the patient to go to surgery for decompression of an area of spinal stenosis, but ultimately it was felt that his risk for surgery was too high and the decision was made to cancel surgery. Now s/p amputation of right leg due to dry gangrene of right transtibial amputation site.  Waxes and wanes on agreeableness for SNF. PMT has been consulted to assist with goals of care conversation. ? ?Clinical Assessment and Goals of Care: ? ?I have reviewed medical records including EPIC notes, labs and imaging, assessed the patient and then met at the bedside to discuss diagnosis prognosis, GOC, EOL wishes, disposition and options. ? ?I introduced Palliative Medicine as specialized medical care for people living with serious illness. It focuses on providing relief from the symptoms and stress of a serious illness. The goal is to improve quality of life for both the patient and the family. ? ?We discussed a brief life review of the patient and then focused on their current illness.  ? ?Medical History Review and Understanding: ?Reviewed patient's back pain and history of back surgery in the past, radiculopathy and left leg weakness. Discussed  today's events in dialysis and difficulty with pain management.  ? ?Social History: ?Patient lives with his daughter and wife, other daughter lives across the street with his mother - she has stage 4 lung cancer. He is spiritual and believes in God. ? ?Functional and Nutritional State: ?Patient's difficulty with ambulation has been very sudden since falling and new onset of progressive back pain. He now needs more assistance than is available in the home with family.  ? ?Palliative Symptoms: ?Pain, nausea ? ?Discussion: ?Patient's goal is to improve pain management, he cannot clearly describe his worse pain level, current pain level, or goal pain level. He also wants to improve mobility of his left leg. We had a length conversation about the rationale for SNF to meet this goal. I then attempted to explore if he has ever considered his symptoms and illnesses to be causing him excessive suffering, if he has even considered when it would be time to transition to a comfort-focused approach. He is tearful and overwhelmed by this, sharing that everything is weighing on him including the health of his daughter who has cancer. Emotional support and therapeutic listening was provided. He is agreeable for continued conversation and support  from PMT. ? ? ?The difference between aggressive medical intervention and comfort care was considered in light of the patient's goals of care. Hospice and Palliative Care services outpatient were explained and offered.  ? ?Discussed the importance of continued conversation with family and the medical providers regarding overall plan of care and treatment options, ensuring decisions are within the context of the patient?s values and GOCs.  ? ?Questions and concerns were addressed.  Hard Choices booklet left for review. The family was encouraged to call with questions or concerns.  PMT will continue to support holistically.  ?  ? ?SUMMARY OF RECOMMENDATIONS   ?-Continue full cope/full scope  treatment ?-Goal is to improve pain management and mobility, return home  ?-Patient continues to be unsure about SNF, would rather continue PT/OT in the hospital. Education was provided ?-Psychosocial and emotional support provided ?-PMT will continue to follow ? ?Prognosis:  ?Unable to determine ? ?Discharge Planning: [place] for rehab with Palliative care service follow-up  ? ?  ? ?Primary Diagnoses: ?Present on Admission: ? ESRD (end stage renal disease) ([place]) ? Essential hypertension, benign ? PAF (paroxysmal atrial fibrillation) ([place]) ? Pacemaker infection ([place]) ? ? ?I have reviewed the medical record, interviewed the patient and family, and examined the patient. The following aspects are pertinent. ? ?Past Medical History:  ?Diagnosis Date  ? Acute blood loss anemia   ? Alcohol abuse   ? Anemia   ? Anemia of chronic disease   ? Arthritis   ? "patient does not think so."  ? Atrial fibrillation ([place])   ? Cardiac syncope 10/07/2014  ? rapid A fib with 8 sec pauses on converison with syncope- temp pacing wire placed then PPM  ? Cataract   ? BILATERAL  ? Coronary artery disease   ? Diabetes mellitus   ? dx---been  awhile  ? Elevated LFTs   ? Endocarditis of tricuspid valve   ? ESRD (end stage renal disease) ([place])   ? [place]  ? GERD (gastroesophageal reflux disease)   ? History of blood transfusion   ? Hyperlipemia   ? Hypertension   ? Malnutrition ([place])   ? Osteomyelitis ([place])   ? right foot  ? Peripheral vascular disease ([place])   ? Presence of permanent cardiac pacemaker 10/08/2014  ? [employer]onic  ? Tricuspid valve vegetation   ? ?Social History  ? ?Socioeconomic History  ? Marital status: Married  ?  Spouse name: Not on file  ? Number of children: 2  ? Years of education: Not on file  ? Highest education level: Not on file  ?Occupational History  ? Occupation: Disabled  ?Tobacco Use  ? Smoking status: Former  ?  Packs/day: 1.50  ?  Years: 30.00  ?  Pack years: 45.00  ?  Types:  Cigarettes  ?  Quit date: 10/07/2014  ?  Years since quitting: 7.3  ? Smokeless tobacco: Never  ? Tobacco comments:  ?  Does an occasional cigar  ?Vaping Use  ? Vaping Use: Never used  ?Substance and Sexual Activity  ? Alcohol use: Not Currently  ?  Alcohol/week: 21.0 standard drinks  ?  Types: 21 Glasses of wine per week  ?  Comment: cut way back, none in the last month  ? Drug use: No  ? Sexual activity: Not on file  ?Other Topics Concern  ? Not on file  ?Social History Narrative  ? Not on file  ? ?Social Determinants of Health  ? ?  Financial Resource Strain: Not on file  ?Food Insecurity: Not on file  ?Transportation Needs: Not on file  ?Physical Activity: Not on file  ?Stress: Not on file  ?Social Connections: Not on file  ? ?Family History  ?Problem Relation Age of Onset  ? Diabetes type II Mother   ? Hypertension Mother   ? Diabetes Mother   ? Liver cancer Father   ? Diabetes type II Sister   ? Breast cancer Sister   ? Diabetes Sister   ? Hypertension Sister   ? Diabetes type II Brother   ? Kidney failure Brother   ? Diabetes Brother   ? Hypertension Brother   ? Diabetes type II Sister   ? ?Scheduled Meds: ? (feeding supplement) PROSource Plus  30 mL Oral BID BM  ? amoxicillin  500 mg Oral Q24H  ? apixaban  5 mg Oral BID  ? vitamin C  1,000 mg Oral Daily  ? atorvastatin  20 mg Oral QHS  ? calcitRIOL  0.25 mcg Oral Daily  ? Chlorhexidine Gluconate Cloth  6 each Topical Q0600  ? darbepoetin (ARANESP) injection - DIALYSIS  100 mcg Intravenous Q Thu-HD  ? docusate sodium  100 mg Oral Daily  ? doxycycline  100 mg Oral Q12H  ? feeding supplement (NEPRO CARB STEADY)  237 mL Oral BID BM  ? furosemide  80 mg Oral Once per day on Sun Mon Wed Fri  ? gabapentin  100 mg Oral QHS  ? insulin aspart  0-6 Units Subcutaneous TID WC  ? insulin aspart protamine- aspart  10 Units Subcutaneous BID WC  ? lidocaine  1 patch Transdermal Q24H  ? metoprolol tartrate  25 mg Oral BID  ? multivitamin with minerals  1 tablet Oral Once per  day on Mon Thu  ? nutrition supplement (JUVEN)  1 packet Oral BID BM  ? pantoprazole  40 mg Oral Daily  ? sodium chloride flush  3 mL Intravenous Q12H  ? zinc sulfate  220 mg Oral Daily  ? ?Continuous Infusi

## 2022-02-06 NOTE — Progress Notes (Signed)
? Alexander [name]  GBT:[id number] DOB: 1961-07-31 DOA: 01/13/2022 ?PCP: [name], MD   ? ? ?Brief Narrative:  ?61 year old with a history of ESRD on HD TTS, HTN, HLD, PVD status post right BKA and left midfoot amputation, A-fib on chronic Eliquis, CAD, DM2, and pacemaker placement who presented to the ED with intractable low back pain over several days with lower extremity radiation and radiculopathy. ? ?The patient underwent MRI with aspiration of a disc space 3/10.  Initially there was a plan for the patient to go to surgery for decompression of an area of spinal stenosis, but ultimately it was felt that his risk for surgery was too high and the decision was made to cancel surgery. ? ?Now s/p amputation of right leg.  Waxes and wanes on agreeableness for SNF.  Family not able to take home.  Will get palliative care for Alexander [name].  Not making progress in mobility.    ? ? ?Consultants:  ?Cardiology -Dr. Einar [name] ?Neurosurgery ?Nephrology ?IR ?Dr. Sharol [name] ? ?Code Status: FULL CODE ? ?DVT prophylaxis: ?Eliquis  ? ?Interim Hx: ?In HD ? ? ?Assessment & Plan: ? ?Lumbar back pain - L2-3 severe spinal stenosis - radiculopathy of left lower extremity ?CT noted moderate neuroforaminal stenosis at L2-3 and L5-S1 - MRI lumbar spine noted subacute fractures L2-L3 disc and severe multifactorial spinal stenosis at L2-3 - followed by Dr. Saintclair [name] with Neurosurgery - surgical intervention canceled due to high risk - IR completed disc aspiration 3/10 with no evidence of infection - has completed a course of IV Decadron - goal at this time is pain management - PT/OT recommend SNF but patient refused - adjusted Neurontin for neuropathic pain 3/16 -adjusted narcotic again for low back pain 3/16 - pain has been much better controlled for the past few days -continue with attempts to fine-tune management -encouraged patient that ambulation/movement/transfers necessary ?-resume neurontin at lower dose- increase as able to max of 300 daily as BID  caused tremors ?-palliative care consult-- left leg continues to get weaker and patient has been refusing PT/OT at times ? ?PVD with dry gangrene of right transtibial amputation site -left transmetatarsal wound poorly healing ?Followed in office by Dr. Sharol [name] - last seen as outpt 01/10/22 for weeping from B LE wounds - placed in compression wrapping - weeping persists here in hospital - Dr. Sharol [name] saw 01/16/22 w/ plan to continue trial w/ compression wrapping - was seen in f/u again 3/16 and initially suggested B LE AKA - arterial studies however make it clear AKA not possible on L due to arterial graft - plan presently is for R AKA 3/22 and ongoing observation of the L with wound care ? ?ESRD on HD TTS ?Nephrology consulted ? ?Paroxysmal atrial fibrillation ?Continue Lopressor and Eliquis - heart rate controlled ? ?HTN ?Blood pressure well controlled ? ?Hyperlipidemia ?Continue statin ? ?DM 2 ?CBG presently at goal -continue to follow trend ? ?Chronic pacemaker infection - Severe tricuspid regurgitation with flail tricuspid valve leaflet - possible vegetation anterior mitral leaflet ?Followed by ID in outpatient setting - continue suppressive therapy with doxycycline and amoxicillin indefinitely ? ?Pressure Injury 01/14/22 Ischial tuberosity Right Stage 3 -  Full thickness tissue loss. Subcutaneous fat may be visible but bone, tendon or muscle are NOT exposed. (Active)  ?01/14/22 0321  ?Location: Ischial tuberosity  ?Location Orientation: Right  ?Staging: Stage 3 -  Full thickness tissue loss. Subcutaneous fat may be visible but bone, tendon or muscle are NOT exposed.  ?Wound Description (Comments):   ?  Present on Admission: Yes  ?Dressing Type Foam - Lift dressing to assess site every shift 02/05/22 2000  ?   ?Pressure Injury 01/14/22 Buttocks Right Stage 2 -  Partial thickness loss of dermis presenting as a shallow open injury with a red, pink wound bed without slough. (Active)  ?01/14/22 0322  ?Location: Buttocks   ?Location Orientation: Right  ?Staging: Stage 2 -  Partial thickness loss of dermis presenting as a shallow open injury with a red, pink wound bed without slough.  ?Wound Description (Comments):   ?Present on Admission: Yes  ?Dressing Type Foam - Lift dressing to assess site every shift 02/05/22 0800  ?   ?Pressure Injury 01/14/22 Buttocks Posterior;Proximal;Right Stage 2 -  Partial thickness loss of dermis presenting as a shallow open injury with a red, pink wound bed without slough. (Active)  ?01/14/22 0324  ?Location: Buttocks  ?Location Orientation: Posterior;Proximal;Right  ?Staging: Stage 2 -  Partial thickness loss of dermis presenting as a shallow open injury with a red, pink wound bed without slough.  ?Wound Description (Comments):   ?Present on Admission: Yes  ?Dressing Type Foam - Lift dressing to assess site every shift 02/05/22 0800  ? ? ?Family Communication: No family present at time of exam ?Disposition: SNF ? ?Objective: ?Blood pressure (!) 120/58, pulse (!) 101, temperature 97.8 ?F (36.6 ?C), resp. rate 16, height 6' (1.829 m), weight 82.6 kg, SpO2 100 %. ? ?Intake/Output Summary (Last 24 hours) at 02/06/2022 1510 ?Last data filed at 02/06/2022 1300 ?Gross per 24 hour  ?Intake 720 ml  ?Output 1300 ml  ?Net -580 ml  ? ?Filed Weights  ? 02/03/22 1122 02/06/22 0709 02/06/22 1213  ?Weight: 82 kg 84 kg 82.6 kg  ? ? ?Examination: ? ? ?General: Appearance:   On HD, sleeping  ?   ?Lungs:     respirations unlabored  ?Heart:    Tachycardic.   ?MS:   Wound vac on right stump  ?   ?  ?  ?  ?  ? ?CBC: ?Recent Labs  ?Lab 02/03/22 ?0724 02/05/22 ?5400 02/06/22 ?0834  ?WBC 8.5 10.4 10.5  ?HGB 8.8* 10.3* 10.3*  ?HCT 28.9* 32.9* 33.3*  ?MCV 97.3 95.9 95.1  ?PLT PLATELET CLUMPS NOTED ON SMEAR, UNABLE TO ESTIMATE 182 204  ? ?Basic Metabolic Panel: ?Recent Labs  ?Lab 02/03/22 ?0725 02/05/22 ?8676 02/06/22 ?0833  ?NA 133* 135 136  ?K 3.8 3.6 3.6  ?CL 96* 96* 98  ?CO2 '26 27 25  '$ ?GLUCOSE 141* 138* 141*  ?BUN 32* 35* 41*   ?CREATININE 3.39* 3.40* 3.58*  ?CALCIUM 8.1* 8.5* 8.6*  ?PHOS 5.3*  --  5.5*  ? ?GFR: ?Estimated Creatinine Clearance: 23.2 mL/min (A) (by C-G formula based on SCr of 3.58 mg/dL (H)). ? ?Liver Function Tests: ?Recent Labs  ?Lab 02/03/22 ?0725 02/06/22 ?0833  ?ALBUMIN 2.0* 2.1*  ? ? ? ? ?CBG: ?Recent Labs  ?Lab 02/05/22 ?1640 02/05/22 ?2101 02/06/22 ?0359 02/06/22 ?0729 02/06/22 ?1252  ?GLUCAP 173* 56* 120* 126* 111*  ? ? ?Scheduled Meds: ? (feeding supplement) PROSource Plus  30 mL Oral BID BM  ? amoxicillin  500 mg Oral Q24H  ? apixaban  5 mg Oral BID  ? vitamin C  1,000 mg Oral Daily  ? atorvastatin  20 mg Oral QHS  ? calcitRIOL  0.25 mcg Oral Daily  ? Chlorhexidine Gluconate Cloth  6 each Topical Q0600  ? darbepoetin (ARANESP) injection - DIALYSIS  100 mcg Intravenous Q Thu-HD  ? docusate sodium  100 mg Oral Daily  ? doxycycline  100 mg Oral Q12H  ? feeding supplement (NEPRO CARB STEADY)  237 mL Oral BID BM  ? furosemide  80 mg Oral Once per day on Sun Mon Wed Fri  ? gabapentin  100 mg Oral QHS  ? insulin aspart  0-6 Units Subcutaneous TID WC  ? insulin aspart protamine- aspart  10 Units Subcutaneous BID WC  ? lidocaine  1 patch Transdermal Q24H  ? metoprolol tartrate  25 mg Oral BID  ? multivitamin with minerals  1 tablet Oral Once per day on Mon Thu  ? nutrition supplement (JUVEN)  1 packet Oral BID BM  ? pantoprazole  40 mg Oral Daily  ? sodium chloride flush  3 mL Intravenous Q12H  ? zinc sulfate  220 mg Oral Daily  ? ? ? LOS: 24 days  ? ?Eulogio Bear DO ? ?If 7PM-7AM, please contact night-coverage per Amion ?02/06/2022, 3:10 PM ? ? ? ?

## 2022-02-06 NOTE — Progress Notes (Signed)
removed 1377ms net fluid unable to remove more due to hypotension.  pre bp 139/65 post bp 90/46.  pt began vomiting first time bp under 1678systolic had some tachycardia at that time 120s.  resolved with fluid bolus and reduction in goal.  pre weight 84kg post weight 82.5kg bed scales.  2 bandages to rua avf no bleeding dressing cdi.  gave calcitriol as ordered. pt had received zofran prior to coming to treatment. ?

## 2022-02-06 NOTE — Procedures (Signed)
I was present at this dialysis session. I have reviewed the session itself and made appropriate changes.  ? ?He was not comfortable for chair this AM, still sig pain.  Will reattempt on 3/30.  ? ?3K bath K is 3.8.  UF goal 3L using RUE AVF.   ? ?Palliative consulted by [facility]. Disposition is still in process.   ? ?Filed Weights  ? 02/03/22 7939 02/03/22 1122 02/06/22 0709  ?Weight: 84.2 kg 82 kg 84 kg  ? ? ?Recent Labs  ?Lab 02/03/22 ?0725 02/05/22 ?0300  ?NA 133* 135  ?K 3.8 3.6  ?CL 96* 96*  ?CO2 26 27  ?GLUCOSE 141* 138*  ?BUN 32* 35*  ?CREATININE 3.39* 3.40*  ?CALCIUM 8.1* 8.5*  ?PHOS 5.3*  --   ? ? ?Recent Labs  ?Lab 02/02/22 ?9233 02/03/22 ?0076 02/05/22 ?2263  ?WBC 9.6 8.5 10.4  ?HGB 9.0* 8.8* 10.3*  ?HCT 29.8* 28.9* 32.9*  ?MCV 97.1 97.3 95.9  ?PLT 131* PLATELET CLUMPS NOTED ON SMEAR, UNABLE TO ESTIMATE 182  ? ? ?Scheduled Meds: ? (feeding supplement) PROSource Plus  30 mL Oral BID BM  ? amoxicillin  500 mg Oral Q24H  ? apixaban  5 mg Oral BID  ? vitamin C  1,000 mg Oral Daily  ? atorvastatin  20 mg Oral QHS  ? calcitRIOL  0.25 mcg Oral Daily  ? Chlorhexidine Gluconate Cloth  6 each Topical Q0600  ? darbepoetin (ARANESP) injection - DIALYSIS  100 mcg Intravenous Q Thu-HD  ? docusate sodium  100 mg Oral Daily  ? doxycycline  100 mg Oral Q12H  ? feeding supplement (NEPRO CARB STEADY)  237 mL Oral BID BM  ? furosemide  80 mg Oral Once per day on Sun Mon Wed Fri  ? gabapentin  100 mg Oral QHS  ? insulin aspart  0-6 Units Subcutaneous TID WC  ? insulin aspart protamine- aspart  10 Units Subcutaneous BID WC  ? lidocaine  1 patch Transdermal Q24H  ? metoprolol tartrate  25 mg Oral BID  ? multivitamin with minerals  1 tablet Oral Once per day on Mon Thu  ? nutrition supplement (JUVEN)  1 packet Oral BID BM  ? pantoprazole  40 mg Oral Daily  ? sodium chloride flush  3 mL Intravenous Q12H  ? zinc sulfate  220 mg Oral Daily  ? ?Continuous Infusions: ? sodium chloride    ? sodium chloride    ? sodium chloride    ? sodium  chloride    ? magnesium sulfate bolus IVPB    ? ?PRN Meds:.sodium chloride, sodium chloride, sodium chloride, sodium chloride, alum & mag hydroxide-simeth, bisacodyl, fentaNYL (SUBLIMAZE) injection, guaiFENesin-dextromethorphan, hydrALAZINE, HYDROmorphone (DILAUDID) injection, HYDROmorphone, labetalol, lidocaine (PF), lidocaine-prilocaine, magnesium sulfate bolus IVPB, methocarbamol, metoprolol tartrate, Muscle Rub, ondansetron **OR** ondansetron (ZOFRAN) IV, ondansetron, oxyCODONE, oxyCODONE, pentafluoroprop-tetrafluoroeth, phenol, polyethylene glycol, potassium chloride, sodium chloride, sodium chloride flush, traMADol, traZODone   ?Pearson Grippe  MD ?02/06/2022, 8:37 AM ?  ?

## 2022-02-07 ENCOUNTER — Ambulatory Visit (HOSPITAL_COMMUNITY)
Admission: RE | Admit: 2022-02-07 | Discharge: 2022-02-07 | Disposition: A | Discharge status: Home / Self Care | Payer: Medicare Other | Source: Ambulatory Visit | Attending: Neurosurgery | Admitting: Neurosurgery

## 2022-02-07 ENCOUNTER — Encounter (HOSPITAL_COMMUNITY): Payer: Self-pay

## 2022-02-07 DIAGNOSIS — T8781 Dehiscence of amputation stump: Secondary | ICD-10-CM | POA: Diagnosis not present

## 2022-02-07 DIAGNOSIS — N186 End stage renal disease: Secondary | ICD-10-CM | POA: Diagnosis not present

## 2022-02-07 DIAGNOSIS — M5416 Radiculopathy, lumbar region: Secondary | ICD-10-CM | POA: Diagnosis not present

## 2022-02-07 DIAGNOSIS — Z7189 Other specified counseling: Secondary | ICD-10-CM | POA: Diagnosis not present

## 2022-02-07 LAB — GLUCOSE, CAPILLARY
Glucose-Capillary: 114 mg/dL — ABNORMAL HIGH (ref 70–99)
Glucose-Capillary: 116 mg/dL — ABNORMAL HIGH (ref 70–99)
Glucose-Capillary: 124 mg/dL — ABNORMAL HIGH (ref 70–99)
Glucose-Capillary: 213 mg/dL — ABNORMAL HIGH (ref 70–99)
Glucose-Capillary: 73 mg/dL (ref 70–99)
Glucose-Capillary: 83 mg/dL (ref 70–99)

## 2022-02-07 NOTE — Progress Notes (Signed)
? ?                                                                                                                                                     ?                                                   ?Daily Progress Note  ? ?Patient Name: Alexander [name]       Date: 02/07/2022 ?DOB: February 19, 1961  Age: 61 y.o. MRN#: [id number] ?Attending Physician: Antonieta Pert, MD ?Primary Care Physician: [name], MD ?Admit Date: 01/13/2022 ? ?Reason for Consultation/Follow-up: Establishing goals of care ? ?Subjective: ?Medical records reviewed. Patient assessed at the bedside. He reports feeling well today. No family present. ? ?We had a lengthy conversation about his goals and wishes. He remains committed to his clear goals of improving pain management and moving his left leg enough to be able to use his slide board for transfers to wheelchair.  ? ?I shared my worry that he may not have the best chance for improvement of leg function if he does not have frequent therapy or added nursing support as he would at [facility]. He feels strongly that if he can transfer to chair in the hospital, he would continue PT/OT at home with [facility]. His hope is that the addition of self-directed exercises will then allow him to build on his progress. I attempted to explore whether his family would be able to help with these transfers, as he is still reporting severe pain with movement. This is unclear and he tells me "I'll think about it" regarding SNF.  ? ?He looks forward to finding out more about MRI results when he has this done. He does not want a comfort-focused approach to his care at this time. Outpatient palliative care services were explained and offered. Patient is not interested at this time.  ? ?Questions and concerns addressed. PMT will continue to support holistically. ? ?Length of Stay: 25 ? ?Current Medications: ?Scheduled Meds:  ? (feeding supplement) PROSource Plus  30 mL Oral BID BM  ? amoxicillin  500 mg Oral Q24H  ? apixaban  5 mg  Oral BID  ? vitamin C  1,000 mg Oral Daily  ? atorvastatin  20 mg Oral QHS  ? calcitRIOL  0.25 mcg Oral Daily  ? darbepoetin (ARANESP) injection - DIALYSIS  100 mcg Intravenous Q Thu-HD  ? docusate sodium  100 mg Oral Daily  ? doxycycline  100 mg Oral Q12H  ? feeding supplement (NEPRO CARB STEADY)  237 mL Oral BID BM  ? furosemide  80 mg Oral Once per day on Sun Mon Wed Fri  ?  gabapentin  200 mg Oral QHS  ? insulin aspart  0-6 Units Subcutaneous TID WC  ? insulin aspart protamine- aspart  10 Units Subcutaneous BID WC  ? lidocaine  1 patch Transdermal Q24H  ? metoprolol tartrate  25 mg Oral BID  ? multivitamin with minerals  1 tablet Oral Once per day on Mon Thu  ? nutrition supplement (JUVEN)  1 packet Oral BID BM  ? pantoprazole  40 mg Oral Daily  ? sodium chloride flush  3 mL Intravenous Q12H  ? zinc sulfate  220 mg Oral Daily  ? ? ?Continuous Infusions: ? sodium chloride    ? sodium chloride    ? magnesium sulfate bolus IVPB    ? ? ?PRN Meds: ?sodium chloride, sodium chloride, alum & mag hydroxide-simeth, bisacodyl, fentaNYL (SUBLIMAZE) injection, guaiFENesin-dextromethorphan, hydrALAZINE, HYDROmorphone (DILAUDID) injection, HYDROmorphone, labetalol, lidocaine (PF), lidocaine-prilocaine, magnesium sulfate bolus IVPB, methocarbamol, metoprolol tartrate, Muscle Rub, ondansetron **OR** ondansetron (ZOFRAN) IV, ondansetron, oxyCODONE, oxyCODONE, pentafluoroprop-tetrafluoroeth, phenol, polyethylene glycol, potassium chloride, sodium chloride, sodium chloride flush, traMADol, traZODone ? ?Physical Exam ?Vitals and nursing note reviewed.  ?Constitutional:   ?   Appearance: He is normal weight. He is ill-appearing.  ?   Interventions: Nasal cannula in place.  ?   Comments: 2L  ?Cardiovascular:  ?   Rate and Rhythm: Normal rate and regular rhythm.  ?Pulmonary:  ?   Effort: Pulmonary effort is normal.  ?Skin: ?   General: Skin is warm and dry.  ?Neurological:  ?   Mental Status: He is alert and oriented to person, place,  and time.  ?         ? ?Vital Signs: BP 104/65 (BP Location: Left Arm)   Pulse 94   Temp 97.9 ?F (36.6 ?C) (Oral)   Resp 18   Ht 6' (1.829 m)   Wt 82.6 kg   SpO2 98%   BMI 24.70 kg/m?  ?SpO2: SpO2: 98 % ?O2 Device: O2 Device: Room Air ?O2 Flow Rate: O2 Flow Rate (L/min): 2 L/min ? ?Intake/output summary:  ?Intake/Output Summary (Last 24 hours) at 02/07/2022 0844 ?Last data filed at 02/07/2022 0422 ?Gross per 24 hour  ?Intake 480 ml  ?Output 1300 ml  ?Net -820 ml  ? ?LBM: Last BM Date : 02/03/22 ?Baseline Weight: Weight: 107.5 kg ?Most recent weight: Weight: 82.6 kg ? ?     ?Palliative Assessment/Data: 40-50% ? ? ? ? ? ?Patient Active Problem List  ? Diagnosis Date Noted  ? Goals of care, counseling/discussion   ? Palliative care by specialist   ? Dehiscence of amputation stump of right lower extremity ([place])   ? Endocarditis 01/21/2022  ? Ulcer of right leg, limited to breakdown of skin ([place])   ? Below-knee amputation of right lower extremity ([place])   ? Ulcer of left foot, limited to breakdown of skin ([place])   ? History of transmetatarsal amputation of left foot ([place])   ? Lumbar back pain with radiculopathy affecting left lower extremity 01/14/2022  ? Dyslipidemia 01/14/2022  ? Type 2 diabetes mellitus with chronic kidney disease, with long-term current use of insulin ([place]) 01/14/2022  ? GERD without esophagitis 01/14/2022  ? Pressure injury of skin 01/14/2022  ? Intractable back pain 01/13/2022  ? Blister 11/08/2021  ? Severe protein-calorie malnutrition ([place]) 11/02/2021  ? Pacemaker infection ([place])   ? Transaminitis   ? Tricuspid valve vegetation   ? Thrombocytopenia ([place]) 10/26/2021  ? Hyponatremia 10/26/2021  ? Acute on chronic diastolic CHF (congestive heart failure) ([place])  10/26/2021  ? Atrial fibrillation with RVR ([place]) 10/25/2021  ? Symptomatic anemia 09/30/2021  ? ESRD on dialysis [facility]) 09/30/2021  ? PAF (paroxysmal atrial fibrillation) ([place]) 09/30/2021  ? NSVT (nonsustained ventricular tachycardia)   ?  Pancytopenia ([place])   ? Acute blood loss anemia 07/18/2020  ? Hemodialysis patient ([place]) 06/04/2020  ? Necrotizing glomerulonephritis 06/04/2020  ? Heme positive stool   ? Melena   ? Acute GI bleeding 05/23/2020  ? Acute hematogenous osteomyelitis of right foot ([place]) 03/11/2020  ? Gangrene of right foot ([place])   ? Diabetic foot ulcer associated with type 2 diabetes mellitus ([place]) 02/19/2020  ? Heel ulcer ([place]) 11/05/2019  ? Gangrene of extremity due to atherosclerosis of artery ([place]) 09/09/2017  ? Essential hypertension, benign 02/07/2015  ? Renal insufficiency 02/07/2015  ? Post PTCA 11/09/2014  ? S/P PTCA (percutaneous transluminal coronary angioplasty) 11/09/2014  ? Pacemaker: [place]etta DR MRI [id number] Dual chamber pacemaker 10/08/2014 10/08/2014  ? Encounter for care of pacemaker 10/08/2014  ? Sinus node dysfunction ([place]) 10/08/2014  ? Cardiac asystole ([place]) 10/07/2014  ? Cardiac syncope 10/07/2014  ? Diabetes mellitus with stage 3 chronic kidney disease ([place]) 10/07/2014  ? CAD (coronary artery disease), native coronary artery 10/07/2014  ? Diabetes mellitus due to underlying condition without complications ([place]) 30/16/0109  ? Anemia, unspecified 05/25/2014  ? Syncope 04/30/2014  ? Atherosclerosis of native arteries of the extremities with gangrene ([place]) 12/16/2013  ? Acute renal failure ([place]) 10/28/2013  ? PVD (peripheral vascular disease) ([place]) 10/27/2013  ? Leukocytosis 10/21/2013  ? Anemia 10/21/2013  ? Osteomyelitis of left foot ([place]) 08/19/2013  ? Spinal stenosis, lumbar region, with neurogenic claudication 06/12/2012  ? Diabetes mellitus with end stage renal disease ([place]) 03/15/2009  ? ALCOHOL ABUSE 03/15/2009  ? TOBACCO USE 03/15/2009  ? HYPERTENSION 03/15/2009  ? ? ?Palliative Care Assessment & Plan  ? ?Patient Profile: ?61 y.o. male  with past medical history of ESRD on HD TTS, HTN, HLD, PVD status post right BKA and left midfoot amputation, A-fib on chronic Eliquis, CAD, DM2, and pacemaker  placement  admitted on 01/13/2022 with intractable low back pain over several days with lower extremity radiation and radiculopathy.  ?  ?MRI lumbar spine noted subacute fractures L2-L3 disc and severe multifactorial spin

## 2022-02-07 NOTE — Progress Notes (Signed)
Physical Therapy Treatment ?Patient Details ?Name: Alexander [name] ?MRN: [id number] ?DOB: Aug 30, 1961 ?Today's Date: 02/07/2022 ? ? ?History of Present Illness The pt is a 61 yo male presenting 3/4 with chronic back pain and weakness to LLE in addition to frequent falls (x2 on day of admission). Pt found to have severe L2-L3 stenosis but surgery was too high risk. Now s/p R AKA on 3/22. PMH: ESRD on HD TTS, paroxysmal atrial fibrillation on Eliquis, pacemaker, CAD s/p stent, diastolic CHF, PVD with right BKA and left midfoot amputation, history of GI bleed, and DM II. ? ?  ?PT Comments  ? ? Pt seen in session with OT. He required min assist rolling, +2 mod assist sidelying to sit, and mod assist sit to sidelying. Pt's mobility has perpetually been limited by pain. Premedicated prior to session but pt reports it is not helping and is still 10/10 pain. Used bed pad in sitting to assist with maintaining anterior pelvic tilt in hopes of alleviating some back pain with unsupported sit. Pt reports no pain relief with this method. Washcloth placed on floor under L foot for pt to perform small arc ROM with LLE. Tolerated sitting EOB approx 5 minutes. Able to perform several lateral scoots with +2 max assist prior to return to supine. ?  ?Recommendations for follow up therapy are one component of a multi-disciplinary discharge planning process, led by the attending physician.  Recommendations may be updated based on patient status, additional functional criteria and insurance authorization. ? ?Follow Up Recommendations ? Skilled nursing-short term rehab (<3 hours/day) ?  ?  ?Assistance Recommended at Discharge Frequent or constant Supervision/Assistance  ?Patient can return home with the following Two people to help with walking and/or transfers;Help with stairs or ramp for entrance ?  ?Equipment Recommendations ? Hospital bed;Other (comment) (hoyer lift)  ?  ?Recommendations for Other Services   ? ? ?  ?Precautions / Restrictions  Precautions ?Precautions: Fall;Other (comment) ?Precaution Comments: new R AKA w/ wound vac, hx of left midfoot amputation,  also with new lumbar stenosis ?Restrictions ?RLE Weight Bearing: Non weight bearing  ?  ? ?Mobility ? Bed Mobility ?Overal bed mobility: Needs Assistance ?Bed Mobility: Rolling, Sidelying to Sit, Sit to Sidelying ?Rolling: Min assist ?Sidelying to sit: +2 for safety/equipment, Mod assist, +2 for physical assistance, HOB elevated ?  ?  ?Sit to sidelying: Mod assist, +2 for safety/equipment ?General bed mobility comments: Pt only agreeable to sitting EOB with +2 assist. + rail. cues for sequencing. Assist with LLE and trunk. Increased time ?  ? ?Transfers ?Overall transfer level: Needs assistance ?  ?  ?  ?  ?  ?  ?  ?  ?General transfer comment: +2 max lateral scoots along EOB. Pt declining attempt at sliding board transfer due to pain. ?  ? ?Ambulation/Gait ?  ?  ?  ?  ?  ?  ?  ?  ? ? ?Stairs ?  ?  ?  ?  ?  ? ? ?Wheelchair Mobility ?  ? ?Modified Rankin (Stroke Patients Only) ?  ? ? ?  ?Balance Overall balance assessment: Needs assistance ?Sitting-balance support: Feet supported, Bilateral upper extremity supported ?Sitting balance-Leahy Scale: Fair ?  ?  ?  ?  ?  ?  ?  ?  ?  ?  ?  ?  ?  ?  ?  ?  ?  ? ?  ?Cognition Arousal/Alertness: Awake/alert ?Behavior During Therapy: [facility] for tasks assessed/performed ?Overall Cognitive Status: Within Functional  Limits for tasks assessed ?  ?  ?  ?  ?  ?  ?  ?  ?  ?  ?  ?  ?  ?  ?  ?  ?General Comments: self limiting, perseverating on pain (calm affect but reports 10/10 pain) ?  ?  ? ?  ?Exercises Other Exercises ?Other Exercises: place washcloth under L foot on floor. Pt able to perform small arc ROM in all planes. ? ?  ?General Comments   ?  ?  ? ?Pertinent Vitals/Pain Pain Assessment ?Pain Assessment: 0-10 ?Pain Score: 10-Worst pain ever ?Pain Intervention(s): Limited activity within patient's tolerance, Monitored during session, Repositioned,  Premedicated before session  ? ? ?Home Living   ?  ?  ?  ?  ?  ?  ?  ?  ?  ?   ?  ?Prior Function    ?  ?  ?   ? ?PT Goals (current goals can now be found in the care plan section) Acute Rehab PT Goals ?Patient Stated Goal: to be able to get stronger and go home ? ?  ?Frequency ? ? ? Min 3X/week ? ? ? ?  ?PT Plan Current plan remains appropriate  ? ? ?Co-evaluation PT/OT/SLP Co-Evaluation/Treatment: Yes ?Reason for Co-Treatment: Complexity of the patient's impairments (multi-system involvement);For patient/therapist safety;To address functional/ADL transfers ?PT goals addressed during session: Mobility/safety with mobility;Balance ?  ?  ? ?  ?AM-PAC PT "6 Clicks" Mobility   ?Outcome Measure ? Help needed turning from your back to your side while in a flat bed without using bedrails?: A Lot ?Help needed moving from lying on your back to sitting on the side of a flat bed without using bedrails?: A Lot ?Help needed moving to and from a bed to a chair (including a wheelchair)?: Total ?Help needed standing up from a chair using your arms (e.g., wheelchair or bedside chair)?: Total ?Help needed to walk in hospital room?: Total ?Help needed climbing 3-5 steps with a railing? : Total ?6 Click Score: 8 ? ?  ?End of Session   ?Activity Tolerance: Patient limited by pain;Other (comment) (self limiting) ?Patient left: in bed;with call bell/phone within reach;with bed alarm set ?Nurse Communication: Mobility status ?PT Visit Diagnosis: Pain;Unsteadiness on feet (R26.81);Repeated falls (R29.6) ?  ? ? ?Time: 2446-2863 ?PT Time Calculation (min) (ACUTE ONLY): 23 min ? ?Charges:  $Therapeutic Activity: 8-22 mins          ?          ? ?Lorrin Goodell, PT  ?Office # [phone] ?Pager [phone] ? ? ? ?Lorriane Shire ?02/07/2022, 10:52 AM ? ?

## 2022-02-07 NOTE — Progress Notes (Signed)
Informed of MRI for today.  ? ?Device system confirmed to be MRI conditional, with implant date > 6 weeks ago, and no evidence of abandoned or epicardial leads in review of most recent CXR ? ?Interrogation pending, if stable, would proceed with VOO at least 15 bpm above presenting, not to exceed 100 bpm without further discussing with provider.  ? ?Tachy-therapies to off if applicable. ? ?Program device back to pre-MRI settings after completion of exam. ? ?Shirley Friar, PA-C  ?02/07/2022 8:39 AM   ?

## 2022-02-07 NOTE — Progress Notes (Signed)
?[facility] ?Progress Note  ? ?Subjective: Seen by Palliative Care. Wishes to continue HD, says he will get in chair today and will do HD in recliner tomorrow. No C/Os. Next HD 02/08/2022 ? ?Objective ?Vitals:  ? 02/06/22 1629 02/06/22 2051 02/07/22 0420 02/07/22 0926  ?BP: (!) 110/52 116/72 104/65 119/81  ?Pulse: 100 60 94 90  ?Resp: '18 17 18 16  '$ ?Temp: 98 ?F (36.7 ?C) 98.4 ?F (36.9 ?C) 97.9 ?F (36.6 ?C) 98 ?F (36.7 ?C)  ?TempSrc:  Oral Oral   ?SpO2: 100% 98% 98% 99%  ?Weight:      ?Height:      ? ?Physical Exam ?General: Chronically ill appearing male in NAD.  ?Heart: S1,S2 RRR. No M/R/G ?Lungs: CTAB ?Abdomen: obese, NT. NABS ?Extremities: LLE wrapped in una boots. L toe amps. New R AKA with ACE wrap, WV present.  ?Dialysis Access: R AVF + T/B  ? ? ? ?Additional Objective ?Labs: ?Basic Metabolic Panel: ?Recent Labs  ?Lab 02/03/22 ?0725 02/05/22 ?9163 02/06/22 ?0833  ?NA 133* 135 136  ?K 3.8 3.6 3.6  ?CL 96* 96* 98  ?CO2 '26 27 25  '$ ?GLUCOSE 141* 138* 141*  ?BUN 32* 35* 41*  ?CREATININE 3.39* 3.40* 3.58*  ?CALCIUM 8.1* 8.5* 8.6*  ?PHOS 5.3*  --  5.5*  ? ?Liver Function Tests: ?Recent Labs  ?Lab 02/03/22 ?0725 02/06/22 ?0833  ?ALBUMIN 2.0* 2.1*  ? ?No results for input(s): LIPASE, AMYLASE in the last 168 hours. ?CBC: ?Recent Labs  ?Lab 02/01/22 ?0442 02/02/22 ?0310 02/03/22 ?8466 02/05/22 ?5993 02/06/22 ?0834  ?WBC 11.0* 9.6 8.5 10.4 10.5  ?HGB 8.7* 9.0* 8.8* 10.3* 10.3*  ?HCT 28.8* 29.8* 28.9* 32.9* 33.3*  ?MCV 97.3 97.1 97.3 95.9 95.1  ?PLT 123* 131* PLATELET CLUMPS NOTED ON SMEAR, UNABLE TO ESTIMATE 182 204  ? ?Blood Culture ?   ?Component Value Date/Time  ? SDES WOUND 01/19/2022 1452  ? SPECREQUEST L3/L4 [place] ASPIRATION 01/19/2022 1452  ? CULT  01/19/2022 1452  ?  No growth aerobically or anaerobically. ?Performed at [facility], [place] [address], [place], [place] [address] ?  ? REPTSTATUS 01/24/2022 FINAL 01/19/2022 1452  ? ? ?Cardiac Enzymes: ?No results for input(s): CKTOTAL, CKMB,  CKMBINDEX, TROPONINI in the last 168 hours. ?CBG: ?Recent Labs  ?Lab 02/06/22 ?1252 02/06/22 ?1627 02/06/22 ?2054 02/07/22 ?0734 02/07/22 ?1147  ?GLUCAP 111* 109* 122* 114* 116*  ? ?Iron Studies: No results for input(s): IRON, TIBC, TRANSFERRIN, FERRITIN in the last 72 hours. ?'@lablastinr3'$ @ ?Studies/Results: ?No results found. ?Medications: ? sodium chloride    ? sodium chloride    ? magnesium sulfate bolus IVPB    ? ? (feeding supplement) PROSource Plus  30 mL Oral BID BM  ? amoxicillin  500 mg Oral Q24H  ? apixaban  5 mg Oral BID  ? vitamin C  1,000 mg Oral Daily  ? atorvastatin  20 mg Oral QHS  ? calcitRIOL  0.25 mcg Oral Daily  ? darbepoetin (ARANESP) injection - DIALYSIS  100 mcg Intravenous Q Thu-HD  ? docusate sodium  100 mg Oral Daily  ? doxycycline  100 mg Oral Q12H  ? feeding supplement (NEPRO CARB STEADY)  237 mL Oral BID BM  ? furosemide  80 mg Oral Once per day on Sun Mon Wed Fri  ? gabapentin  200 mg Oral QHS  ? insulin aspart  0-6 Units Subcutaneous TID WC  ? insulin aspart protamine- aspart  10 Units Subcutaneous BID WC  ? lidocaine  1 patch  Transdermal Q24H  ? metoprolol tartrate  25 mg Oral BID  ? multivitamin with minerals  1 tablet Oral Once per day on Mon Thu  ? nutrition supplement (JUVEN)  1 packet Oral BID BM  ? pantoprazole  40 mg Oral Daily  ? sodium chloride flush  3 mL Intravenous Q12H  ? zinc sulfate  220 mg Oral Daily  ? ? ? ?Dialysis Orders: ?TTS - GOC ?4hrs48mn, BFR 400, DFR 800,  EDW 94kg, 3K/ 2.5Ca ?-No Heparin ?-Mircera 200 mcg IV q 2wks - last 01/11/22 ?-Calcitriol 0.244m PO TIW ?  ?  ?Assessment/Plan: ?1 Lumbar back pain/Radiculopathy: Followed by Spine Surgery Dr. CrSaintclair HalstedMR Lumbar Spine completed: showed fractures L2-L3 with fluid-? Trauma vs. Infection. Neurosurgeon canceled surgery secondary to high risk. L2-3 disc aspiration 01/19/2022 culture negative.Continue pain management and Pt/OT. Eventually as outpt either will need PMD or pain clinic to manage.  ?2. PVD with Dry  gangrene:R AKA on Wednesday 01/31/2022 per Dr. DuSharol GivenWill continue to observe LLE.  ?3. ESRD - on HD TTS. HD today on schedule. Next HD 02/08/2022. No heparin.  ?4. Hypertension/volume  -BP stable this a.m. Volume is stable. Very much under OP EDW if wt is accurate. Lower EDW on discharge. Tentatively establish 82 kg as new EDW.  UF as tolerated.  ?5. Anemia of CKD - Hgb 10.3 02/06/2022. Given Aranesp 100 mcg IV 02/01/2022. Follow HGB. Transfuse PRN.  ? 6. Secondary Hyperparathyroidism - PO4 at goal. Continue VDRA. ?7. Nutrition - Renal diet on fluid restriction. Albumin very low. On protein supps, renal vitamins  ?8. DMT2-per primary ?9. 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 ABX. Follows with cards. ? ?RiJimmye NormanBrown NP-C ?02/07/2022, 11:53 AM  ?[facility] ?[phone] ? ?  ? ?

## 2022-02-07 NOTE — Progress Notes (Signed)
?PROGRESS NOTE ?Alexander [name]  NOB:[id number] DOB: 1961/07/24 DOA: 01/13/2022 ?PCP: Alexander Pao, MD  ? ?Brief Narrative/Hospital Course: ?Alexander [name] is a 61 y.o. male with medical history significant for ESRD on HD on TTS, HTN, HLD, PVD status post right BKA and left midfoot amputation, Afib on Eliquis, CAD, DM2, and status post pacemaker, presented to the ER with worsening intractable low back pain over the last couple of days with associated left lower extremity radiation and radiculopathy with associated spasm and weakness, denies any urinary or stool incontinence. He has been having chronic low back pain with occasional radiation to both lower extremities. Pt has been followed by spine surgery and was scheduled for LS spine MRI on 02/07/2022.  Plan is for disc aspiration and if no infection then patient will have surgical decompression.  Aspiration done 3/10.Initially there was a plan for the patient to go to surgery for decompression of an area of spinal stenosis, but ultimately it was felt that his risk for surgery was too high and the decision was made to cancel surgery.Now s/p amputation of right leg.  Waxes and wanes on agreeableness for SNF.  Family not able to take home. Appreciate Dr. Irven [name] help with [place].   ?Focus now is on pain control PMT-pain clinic appointment.  ?  ?Subjective: ?Seen and examined this morning.  Appears comfortable calm.  Reports back pain is 10 out of 10 although he appears very comfortable ? ?Assessment and Plan: ?Principal Problem: ?  Lumbar back pain with radiculopathy affecting left lower extremity ?Active Problems: ?  ESRD on dialysis [facility]) ?  PAF (paroxysmal atrial fibrillation) ([facility]) ?  Essential hypertension, benign ?  Type 2 diabetes mellitus with chronic kidney disease, with long-term current use of insulin ([place]) ?  GERD without esophagitis ?  Gangrene of extremity due to atherosclerosis of artery ([place]) ?  Pacemaker infection ([place]) ?  Severe protein-calorie  malnutrition ([place]) ?  Pressure injury of skin ?  Ulcer of right leg, limited to breakdown of skin ([place]) ?  Below-knee amputation of right lower extremity ([place]) ?  Endocarditis ?  Dehiscence of amputation stump of right lower extremity ([facility]) ?  Goals of care, counseling/discussion ?  Palliative care by specialist ? ?Lumbar back pain w/ L2-3 severe spinal stenosis ?Radiculopathy of left lower extremity: ?CT-moderate neuroforaminal stenosis at L2-3 and L5-S1 - MRI lS-subacute fractures L2-L3 disc and severe multifactorial spinal stenosis at L2-3, followed by Dr. Saintclair [name] neurosurgery, surgical intervention consulted diabetes-IR completed disc aspiration 3/10 with no evidence of infection,s/p completion of IV Decadron.   ?Palliative care consulted, at this time goal is for pain management PT OT-have recommended skilled nursing facility.  Neurontin started for neuropathic pain 10/16, and on narcotics adjusted for back pain from 216.  Continue to fine-tune pain management, encourage ambulation movement/transfer as tolerated PT OT, continue Neurontin titrate to dose can max out 300 daily as twice daily caused tremors.  Appreciate palliative care consult.  Left leg continues to get weaker and he has been refusing PT OT at times. MRI is ordered by Dr Alexander [name]. ? ?PVD with dry gangrene of right transtibial amputation site ?Left metatarsal wound poorly healing: ?Followed by Dr. Sharol [name] as outpatient initially/did bilateral lower extremity AKA but arterial studies however medically cleared AKA not possible on left due to arterial graft, now Rt AKA 3/22- and on wound vac.  Ongoing observation of the left with wound care. ? ?ESRD on HD TTS nephro on board for HD.  New  EDW tentatively 82 kg. ?Secondary hyperparathyroidism: Phosphorus at goal continue with ?Anemia of chronic renal disease/chronic disease:hb stable. Got aranesp ?Recent Labs  ?Lab 02/01/22 ?0442 02/02/22 ?0310 02/03/22 ?3614 02/05/22 ?4315 02/06/22 ?0834  ?HGB 8.7* 9.0* 8.8*  10.3* 10.3*  ?HCT 28.8* 29.8* 28.9* 32.9* 33.3*  ?  ? ?QMG:QQPYPPJK Lopressor and Eliquis rate is controlled.  Seen by cardiology.   ?HTN: BP is well controlled. ?HLD continue statin ?T2DM blood sugar controlled monitor as below with SSI ?Recent Labs  ?Lab 02/06/22 ?0729 02/06/22 ?1252 02/06/22 ?1627 02/06/22 ?2054 02/07/22 ?0734  ?GLUCAP 126* 111* 109* 122* 114*  ? ?Chronic pacemaker infection ?severe TR with flail tricuspid valve leaflet ?Possible vegetation on anterior mitral leaflet: ?Followed by ID outpatient setting continue suppressive therapy with doxycycline and doxycycline indefinitely ?GERD- cont ppi ?Pressure wound see below ? ?DVT prophylaxis: SCD's Start: 01/31/22 1523.ELiquis ?Code Status:   Code Status: Full Code ?Family Communication: Plan of care discussed with patient at bedside. ?Patient status is: inpatient Level of care: Med-Surg  ?Remains inpatient because: Ongoing pain management, disposition is ?Patient currently not stable ? ?Dispo: The patient is from: home ?           Anticipated disposition: SNF ? ?Mobility Assessment (last 72 hours)   ? ? Mobility Assessment   ? ? [place] Name 02/07/22 1047 02/06/22 0730 02/05/22 1300 02/05/22 1200 02/05/22 0800  ? Does patient have an order for bedrest or is patient medically unstable -- No - Continue assessment No - Continue assessment -- No - Continue assessment  ? What is the highest level of mobility based on the progressive mobility assessment? Level 2 ([place]) - Balance while sitting on edge of bed and cannot stand Level 2 (Chairfast) - Balance while sitting on edge of bed and cannot stand Level 2 (Chairfast) - Balance while sitting on edge of bed and cannot stand Level 2 (Chairfast) - Balance while sitting on edge of bed and cannot stand Level 2 (Chairfast) - Balance while sitting on edge of bed and cannot stand  ? Is the above level different from baseline mobility prior to current illness? -- Yes - Recommend PT order Yes - Recommend PT order --  Yes - Recommend PT order  ? ? [place] Name 02/04/22 2000  ?  ?  ?  ?  ? Does patient have an order for bedrest or is patient medically unstable No - Continue assessment      ? What is the highest level of mobility based on the progressive mobility assessment? Level 1 (Bedfast) - Unable to balance while sitting on edge of bed      ? Is the above level different from baseline mobility prior to current illness? Yes - Recommend PT order      ? ?  ?  ? ?  ?  ? ?Objective: ?Vitals last 24 hrs: ?Vitals:  ? 02/06/22 1629 02/06/22 2051 02/07/22 0420 02/07/22 0926  ?BP: (!) 110/52 116/72 104/65 119/81  ?Pulse: 100 60 94 90  ?Resp: '18 17 18 16  '$ ?Temp: 98 ?F (36.7 ?C) 98.4 ?F (36.9 ?C) 97.9 ?F (36.6 ?C) 98 ?F (36.7 ?C)  ?TempSrc:  Oral Oral   ?SpO2: 100% 98% 98% 99%  ?Weight:      ?Height:      ? ?Weight change:  ? ?Physical Examination: ?General exam: AA0X3,older than stated age, weak appearing. ?HEENT:Oral mucosa moist, Ear/Nose WNL grossly, dentition normal. ?Respiratory system: bilaterally clear breath sounds, no use of accessory muscle ?Cardiovascular system:  S1 & S2 +, No JVD,. ?Gastrointestinal system: Abdomen soft,NT,ND, BS+ ?Nervous System:Alert, awake, moving extremities and grossly nonfocal ?Extremities: Right AKA stump wound VAC in place, left knee with dressing in place, TMA LT, cannot move left leg ?Skin: No rashes,no icterus. ?MSK: Normal muscle bulk,tone, power ? ?Medications reviewed:  ?Scheduled Meds: ? (feeding supplement) PROSource Plus  30 mL Oral BID BM  ? amoxicillin  500 mg Oral Q24H  ? apixaban  5 mg Oral BID  ? vitamin C  1,000 mg Oral Daily  ? atorvastatin  20 mg Oral QHS  ? calcitRIOL  0.25 mcg Oral Daily  ? darbepoetin (ARANESP) injection - DIALYSIS  100 mcg Intravenous Q Thu-HD  ? docusate sodium  100 mg Oral Daily  ? doxycycline  100 mg Oral Q12H  ? feeding supplement (NEPRO CARB STEADY)  237 mL Oral BID BM  ? furosemide  80 mg Oral Once per day on Sun Mon Wed Fri  ? gabapentin  200 mg Oral QHS  ?  insulin aspart  0-6 Units Subcutaneous TID WC  ? insulin aspart protamine- aspart  10 Units Subcutaneous BID WC  ? lidocaine  1 patch Transdermal Q24H  ? metoprolol tartrate  25 mg Oral BID  ? multivitamin with m

## 2022-02-07 NOTE — TOC Progression Note (Signed)
Transition of Care (TOC) - Initial/Assessment Note  ? ? ?Patient Details  ?Name: Alexander [name] ?MRN: [id number] ?Date of Birth: [birth year] ? ?Transition of Care (TOC) CM/SW Contact:    ?Alexander [name], LCSWA ?Phone Number: ?02/07/2022, 2:12 PM ? ?Clinical Narrative:                 ?CSW and RNCM met with the patient and patient's daughter Alexander [name] at bedside.  The patient reported that he does not remember telling anyone that he would go to a SNF.  CSW explained PT's recommendation for SNF and RNCM explained home health and the need for family participation.  The patient reports that he is still in a lot of pain.  CSW inquired about the patient sitting for dialysis v/s receiving dialysis in bed.  The patient reports that he will try to sit for dialysis tomorrow.  Both RNCM and CSW encouraged the patient to attempt to sit for dialysis tomorrow.   ? ?The patient will need to sit for dialysis in order to discharge to a SNF or home due to dialysis clinics not providing stretcher dialysis.   ? ?Expected Discharge Plan: Home/Self Care ?Barriers to Discharge: Continued Medical Work up ? ? ?Patient Goals and CMS Choice ?Patient states their goals for this hospitalization and ongoing recovery are:: Pt states he would like to return home after hospitalization. ?CMS Medicare.gov Compare Post Acute Care list provided to:: Patient ?Choice offered to / list presented to : Patient ? ?Expected Discharge Plan and Services ?Expected Discharge Plan: Home/Self Care ?  ?  ?Post Acute Care Choice: Resumption of Svcs/PTA Provider ?Living arrangements for the past 2 months: [place] ?                ?  ?  ?  ?  ?  ?  ?  ?  ?  ?  ? ?Prior Living Arrangements/Services ?Living arrangements for the past 2 months: [place] ?Lives with:: Adult Children, Spouse ?Patient language and need for interpreter reviewed:: Yes ?Do you feel safe going back to the place where you live?: Yes      ?Need for Family Participation in Patient Care:  Yes (Comment) ?Care giver support system in place?: Yes (comment) ?Current home services: DME ?Criminal Activity/Legal Involvement Pertinent to Current Situation/Hospitalization: No - Comment as needed ? ?Activities of Daily Living ?Home Assistive Devices/Equipment: CBG Meter ?ADL Screening (condition at time of admission) ?Patient's cognitive ability adequate to safely complete daily activities?: Yes ?Is the patient deaf or have difficulty hearing?: No ?Does the patient have difficulty seeing, even when wearing glasses/contacts?: No ?Does the patient have difficulty concentrating, remembering, or making decisions?: No ?Patient able to express need for assistance with ADLs?: Yes ?Does the patient have difficulty dressing or bathing?: Yes ?Independently performs ADLs?: No ?Communication: Independent ?Dressing (OT): Needs assistance ?Is this a change from baseline?: Pre-admission baseline ?Grooming: Independent ?Feeding: Independent ?Bathing: Needs assistance ?Is this a change from baseline?: Change from baseline, expected to last >3 days ?Toileting: Needs assistance ?Is this a change from baseline?: Pre-admission baseline ?In/Out Bed: Needs assistance ?Is this a change from baseline?: Pre-admission baseline ?Does the patient have difficulty walking or climbing stairs?: Yes ?Weakness of Legs: Both ?Weakness of Arms/Hands: Both ? ?Permission Sought/Granted ?Permission sought to share information with : Family Supports ?Permission granted to share information with : No ?   ?   ?   ?   ? ?Emotional Assessment ?Appearance:: Appears stated age ?Attitude/Demeanor/Rapport:  Engaged ?Affect (typically observed): Appropriate ?Orientation: : Oriented to Self, Oriented to Place, Oriented to  Time, Oriented to Situation ?Alcohol / Substance Use: Not Applicable ?Psych Involvement: No (comment) ? ?Admission diagnosis:  Intractable back pain [M54.9] ?Patient Active Problem List  ? Diagnosis Date Noted  ? Goals of care,  counseling/discussion   ? Palliative care by specialist   ? Dehiscence of amputation stump of right lower extremity ([place])   ? Endocarditis 01/21/2022  ? Ulcer of right leg, limited to breakdown of skin ([place])   ? Below-knee amputation of right lower extremity ([place])   ? Ulcer of left foot, limited to breakdown of skin ([place])   ? History of transmetatarsal amputation of left foot ([place])   ? Lumbar back pain with radiculopathy affecting left lower extremity 01/14/2022  ? Dyslipidemia 01/14/2022  ? Type 2 diabetes mellitus with chronic kidney disease, with long-term current use of insulin ([place]) 01/14/2022  ? GERD without esophagitis 01/14/2022  ? Pressure injury of skin 01/14/2022  ? Intractable back pain 01/13/2022  ? Blister 11/08/2021  ? Severe protein-calorie malnutrition ([place]) 11/02/2021  ? Pacemaker infection ([place])   ? Transaminitis   ? Tricuspid valve vegetation   ? Thrombocytopenia ([place]) 10/26/2021  ? Hyponatremia 10/26/2021  ? Acute on chronic diastolic CHF (congestive heart failure) ([place]) 10/26/2021  ? Atrial fibrillation with RVR ([place]) 10/25/2021  ? Symptomatic anemia 09/30/2021  ? ESRD on dialysis [facility]) 09/30/2021  ? PAF (paroxysmal atrial fibrillation) ([place]) 09/30/2021  ? NSVT (nonsustained ventricular tachycardia)   ? Pancytopenia ([place])   ? Acute blood loss anemia 07/18/2020  ? Hemodialysis patient ([place]) 06/04/2020  ? Necrotizing glomerulonephritis 06/04/2020  ? Heme positive stool   ? Melena   ? Acute GI bleeding 05/23/2020  ? Acute hematogenous osteomyelitis of right foot ([place]) 03/11/2020  ? Gangrene of right foot ([place])   ? Diabetic foot ulcer associated with type 2 diabetes mellitus ([place]) 02/19/2020  ? Heel ulcer ([place]) 11/05/2019  ? Gangrene of extremity due to atherosclerosis of artery ([place]) 09/09/2017  ? Essential hypertension, benign 02/07/2015  ? Renal insufficiency 02/07/2015  ? Post PTCA 11/09/2014  ? S/P PTCA (percutaneous transluminal coronary angioplasty) 11/09/2014  ? Pacemaker: [place]  DR MRI [id number] Dual chamber pacemaker 10/08/2014 10/08/2014  ? Encounter for care of pacemaker 10/08/2014  ? Sinus node dysfunction ([place]) 10/08/2014  ? Cardiac asystole ([place]) 10/07/2014  ? Cardiac syncope 10/07/2014  ? Diabetes mellitus with stage 3 chronic kidney disease ([place]) 10/07/2014  ? CAD (coronary artery disease), native coronary artery 10/07/2014  ? Diabetes mellitus due to underlying condition without complications ([place]) 60/73/7106  ? Anemia, unspecified 05/25/2014  ? Syncope 04/30/2014  ? Atherosclerosis of native arteries of the extremities with gangrene ([place]) 12/16/2013  ? Acute renal failure ([place]) 10/28/2013  ? PVD (peripheral vascular disease) ([place]) 10/27/2013  ? Leukocytosis 10/21/2013  ? Anemia 10/21/2013  ? Osteomyelitis of left foot ([place]) 08/19/2013  ? Spinal stenosis, lumbar region, with neurogenic claudication 06/12/2012  ?  Class: Diagnosis of  ? Diabetes mellitus with end stage renal disease ([place]) 03/15/2009  ? ALCOHOL ABUSE 03/15/2009  ? TOBACCO USE 03/15/2009  ? HYPERTENSION 03/15/2009  ? ?PCP:  [name], MD ?Pharmacy:   ?[facility], [address] ?[place] [address] ?Phone: [phone] Fax: [phone] ? ?[id number] [place] [place] - [address]?Phone: [phone]: [phone]? ?[facility] ?[address] ?[place] [address] ?Phone: [phone] Fax: [phone] ? ? ? ? ?Social Determinants of Health (SDOH) Interventions ?  ? ?Readmission Risk Interventions ? ?  10/27/2021  ?  3:51 PM 05/26/2020  ?  1:00 PM 03/15/2020  ?  4:52 PM  ?Readmission Risk Prevention Plan  ?Transportation Screening Complete Complete Complete  ?PCP or Specialist Appt within 5-7 Days   Complete  ?PCP or Specialist Appt within 3-5 Days Complete    ?Home Care Screening   Complete  ?Medication Review (RN CM)   Complete  ?[place] or Home Care  Consult Complete    ?Social Work Consult for [place] Planning/Counseling Complete    ?Palliative Care Screening Not Applicable    ?Medication Review Press photographer) Complete Complete   ?PCP or Specialist ap

## 2022-02-07 NOTE — Progress Notes (Signed)
Occupational Therapy Treatment ?Patient Details ?Name: Alexander [name] ?MRN: [id number] ?DOB: Dec 07, 1960 ?Today's Date: 02/07/2022 ? ? ?History of present illness The pt is a 61 yo male presenting 3/4 with chronic back pain and weakness to LLE in addition to frequent falls (x2 on day of admission). Pt found to have severe L2-L3 stenosis but surgery was too high risk. Now s/p R AKA on 3/22. PMH: ESRD on HD TTS, paroxysmal atrial fibrillation on Eliquis, pacemaker, CAD s/p stent, diastolic CHF, PVD with right BKA and left midfoot amputation, history of GI bleed, and DM II. ?  ?OT comments ? Pt remains limited by reported 10/10 back pain though premedicated prior to session. When distracted and engaged in conversation, pt able to sit EOB for posture techniques, LE exercises and scooting attempts at Max A x 2. Encouraged continue UE HEP completion with level 3 theraband and raising HOB fully during meals to improve upright tolerance. Pt agreeable for OOB transfers in next session. Continue to recommend SNF due to significant L LE weakness and reported back pain limiting activity tolerance for ADLs/transfers.  ? ?Recommendations for follow up therapy are one component of a multi-disciplinary discharge planning process, led by the attending physician.  Recommendations may be updated based on patient status, additional functional criteria and insurance authorization. ?   ?Follow Up Recommendations ? Skilled nursing-short term rehab (<3 hours/day)  ?  ?Assistance Recommended at Discharge Frequent or constant Supervision/Assistance  ?Patient can return home with the following ? Two people to help with walking and/or transfers;A lot of help with bathing/dressing/bathroom ?  ?Equipment Recommendations ? Tub/shower bench  ?  ?Recommendations for Other Services   ? ?  ?Precautions / Restrictions Precautions ?Precautions: Fall;Other (comment) ?Precaution Comments: new R AKA w/ wound vac, hx of left midfoot amputation,  also with new  lumbar stenosis ?Restrictions ?Weight Bearing Restrictions: Yes ?RLE Weight Bearing: Non weight bearing  ? ? ?  ? ?Mobility Bed Mobility ?Overal bed mobility: Needs Assistance ?Bed Mobility: Rolling, Sidelying to Sit, Sit to Sidelying ?Rolling: Min assist ?Sidelying to sit: +2 for safety/equipment, Mod assist, +2 for physical assistance, HOB elevated ?  ?  ?Sit to sidelying: Mod assist, +2 for safety/equipment ?General bed mobility comments: Pt only agreeable to sitting EOB with +2 assist. + rail. cues for sequencing. Assist with LLE and trunk. Increased time ?  ? ?Transfers ?Overall transfer level: Needs assistance ?  ?  ?  ?  ?  ?  ?  ?  ?General transfer comment: +2 max lateral scoots along EOB. Pt declining attempt at sliding board transfer due to pain. ?  ?  ?Balance Overall balance assessment: Needs assistance ?Sitting-balance support: Feet supported, Bilateral upper extremity supported ?Sitting balance-Leahy Scale: Fair ?Sitting balance - Comments: able to sit statically without UE support though more confident with BUE support. Dynamic balance deficits noted. . ?  ?  ?  ?  ?  ?  ?  ?  ?  ?  ?  ?  ?  ?  ?  ?   ? ?ADL either performed or assessed with clinical judgement  ? ?ADL Overall ADL's : Needs assistance/impaired ?  ?  ?  ?  ?  ?  ?  ?  ?  ?  ?  ?  ?  ?  ?  ?  ?  ?  ?  ?General ADL Comments: Emphasis on dynamic sitting balance, pain control and encouragement of scooting/OOB attempts ?  ? ?Extremity/Trunk Assessment Upper  Extremity Assessment ?Upper Extremity Assessment: Overall WFL for tasks assessed ?  ?Lower Extremity Assessment ?Lower Extremity Assessment: Defer to PT evaluation ?  ?  ?  ? ?Vision   ?Vision Assessment?: No apparent visual deficits ?  ?Perception   ?  ?Praxis   ?  ? ?Cognition Arousal/Alertness: Awake/alert ?Behavior During Therapy: [facility] for tasks assessed/performed ?Overall Cognitive Status: Within Functional Limits for tasks assessed ?  ?  ?  ?  ?  ?  ?  ?  ?  ?  ?  ?  ?  ?  ?  ?   ?General Comments: self limiting, perseverating on pain (calm affect but reports 10/10 pain) ?  ?  ?   ?Exercises   ? ?  ?Shoulder Instructions   ? ? ?  ?General Comments Encouraged theraband use, HOB elevation with meals to improve tolerance of back pain with upright positioning  ? ? ?Pertinent Vitals/ Pain       Pain Assessment ?Pain Assessment: 0-10 ?Pain Score: 10-Worst pain ever ?Pain Intervention(s): Limited activity within patient's tolerance, Monitored during session ? ?Home Living   ?  ?  ?  ?  ?  ?  ?  ?  ?  ?  ?  ?  ?  ?  ?  ?  ?  ?  ? ?  ?Prior Functioning/Environment    ?  ?  ?  ?   ? ?Frequency ? Min 2X/week  ? ? ? ? ?  ?Progress Toward Goals ? ?OT Goals(current goals can now be found in the care plan section) ? Progress towards OT goals: Progressing toward goals ? ?Acute Rehab OT Goals ?Patient Stated Goal: back pain control ?OT Goal Formulation: With patient ?Time For Goal Achievement: 02/15/22 ?Potential to Achieve Goals: Good ?ADL Goals ?Pt Will Perform Grooming: sitting;with set-up ?Pt Will Perform Upper Body Bathing: with min guard assist;sitting ?Pt Will Perform Lower Body Bathing: with min assist ?Pt Will Perform Upper Body Dressing: with min guard assist;sitting ?Pt Will Perform Lower Body Dressing: with mod assist ?Pt Will Transfer to Toilet: with mod assist;anterior/posterior transfer;with transfer board;bedside commode ?Pt Will Perform Toileting - Clothing Manipulation and hygiene: with mod assist;sitting/lateral leans  ?Plan Discharge plan needs to be updated   ? ?Co-evaluation ? ? ? PT/OT/SLP Co-Evaluation/Treatment: Yes ?Reason for Co-Treatment: To address functional/ADL transfers ?PT goals addressed during session: Mobility/safety with mobility;Balance ?OT goals addressed during session: ADL's and self-care;Strengthening/ROM ?  ? ?  ?AM-PAC OT "6 Clicks" Daily Activity     ?Outcome Measure ? ? Help from another person eating meals?: None ?Help from another person taking care of  personal grooming?: A Little ?Help from another person toileting, which includes using toliet, bedpan, or urinal?: Total ?Help from another person bathing (including washing, rinsing, drying)?: Total ?Help from another person to put on and taking off regular upper body clothing?: A Little ?Help from another person to put on and taking off regular lower body clothing?: Total ?6 Click Score: 13 ? ?  ?End of Session   ? ?OT Visit Diagnosis: Muscle weakness (generalized) (M62.81);Pain ?Pain - Right/Left: Right ?Pain - part of body: Leg ?  ?Activity Tolerance Patient limited by pain ?  ?Patient Left in bed;with call bell/phone within reach;with bed alarm set ?  ?Nurse Communication   ?  ? ?   ? ?Time: 9518-8416 ?OT Time Calculation (min): 23 min ? ?Charges: OT General Charges ?$OT Visit: 1 Visit ?OT Treatments ?$Therapeutic Activity: 8-22 mins ? ?Almyra Free  B, OTR/L ?Acute Rehab Services ?Office: [phone]  ? ?[name] ?02/07/2022, 12:02 PM ?

## 2022-02-08 DIAGNOSIS — M5416 Radiculopathy, lumbar region: Secondary | ICD-10-CM | POA: Diagnosis not present

## 2022-02-08 LAB — CBC
HCT: 32.5 % — ABNORMAL LOW (ref 39.0–52.0)
Hemoglobin: 10.1 g/dL — ABNORMAL LOW (ref 13.0–17.0)
MCH: 29.5 pg (ref 26.0–34.0)
MCHC: 31.1 g/dL (ref 30.0–36.0)
MCV: 95 fL (ref 80.0–100.0)
Platelets: 192 10*3/uL (ref 150–400)
RBC: 3.42 MIL/uL — ABNORMAL LOW (ref 4.22–5.81)
RDW: 16.8 % — ABNORMAL HIGH (ref 11.5–15.5)
WBC: 10.5 10*3/uL (ref 4.0–10.5)
nRBC: 0 % (ref 0.0–0.2)

## 2022-02-08 LAB — RENAL FUNCTION PANEL
Albumin: 2.2 g/dL — ABNORMAL LOW (ref 3.5–5.0)
Anion gap: 11 (ref 5–15)
BUN: 42 mg/dL — ABNORMAL HIGH (ref 8–23)
CO2: 26 mmol/L (ref 22–32)
Calcium: 8.6 mg/dL — ABNORMAL LOW (ref 8.9–10.3)
Chloride: 97 mmol/L — ABNORMAL LOW (ref 98–111)
Creatinine, Ser: 3.22 mg/dL — ABNORMAL HIGH (ref 0.61–1.24)
GFR, Estimated: 21 mL/min — ABNORMAL LOW (ref >60)
Glucose, Bld: 165 mg/dL — ABNORMAL HIGH (ref 70–99)
Phosphorus: 4.3 mg/dL (ref 2.5–4.6)
Potassium: 4 mmol/L (ref 3.5–5.1)
Sodium: 134 mmol/L — ABNORMAL LOW (ref 135–145)

## 2022-02-08 LAB — GLUCOSE, CAPILLARY
Glucose-Capillary: 115 mg/dL — ABNORMAL HIGH (ref 70–99)
Glucose-Capillary: 182 mg/dL — ABNORMAL HIGH (ref 70–99)
Glucose-Capillary: 138 mg/dL — ABNORMAL HIGH (ref 70–99)

## 2022-02-08 MED ORDER — HEPARIN SODIUM (PORCINE) 1000 UNIT/ML DIALYSIS: 20.0000 [IU]/kg | INTRAMUSCULAR | Status: DC | PRN

## 2022-02-08 MED ORDER — SODIUM CHLORIDE 0.9 % IV SOLN: 100.0000 mL | INTRAVENOUS | Status: DC | PRN

## 2022-02-08 NOTE — Progress Notes (Signed)
?PROGRESS NOTE ?LEVIATHAN MACERA  BUL:[id number] DOB: Jan 22, 1961 DOA: 01/13/2022 ?PCP: [name], MD  ? ?Brief Narrative/Hospital Course: ?BARRINGTON WORLEY is a 61 y.o. male with medical history significant for ESRD on HD on TTS, HTN, HLD, PVD status post right BKA and left midfoot amputation, Afib on Eliquis, CAD, DM2, and status post pacemaker, presented to the ER with worsening intractable low back pain over the last couple of days with associated left lower extremity radiation and radiculopathy with associated spasm and weakness, denies any urinary or stool incontinence. He has been having chronic low back pain with occasional radiation to both lower extremities. Pt has been followed by spine surgery and was scheduled for LS spine MRI on 02/07/2022.  Plan is for disc aspiration and if no infection then patient will have surgical decompression.  Aspiration done 3/10.Initially there was a plan for the patient to go to surgery for decompression of an area of spinal stenosis, but ultimately it was felt that his risk for surgery was too high and the decision was made to cancel surgery.Now s/p amputation of right leg.  Waxes and wanes on agreeableness for SNF.  Family not able to take home. Appreciate Dr. Irven Shelling help with [place].   ?Focus now is on pain control PMT-pain clinic appointment.  ?  ?Subjective: ?Seen and examined this morning.  Patient is pleasant resting comfortably. ?Reports currently has no pain and he has no pain mostly on resting gets low back pain on moving ?Afebrile overnight, BP stable ?Unable to get chair this morning for dialysis currently getting dialysis on bed. ?He reports he is agreeable for skilled nursing facility. ? ?Assessment and Plan: ?Principal Problem: ?  Lumbar back pain with radiculopathy affecting left lower extremity ?Active Problems: ?  ESRD on dialysis [facility]) ?  PAF (paroxysmal atrial fibrillation) ([place]) ?  Essential hypertension, benign ?  Type 2 diabetes mellitus with chronic  kidney disease, with long-term current use of insulin ([place]) ?  GERD without esophagitis ?  Gangrene of extremity due to atherosclerosis of artery ([place]) ?  Pacemaker infection ([place]) ?  Severe protein-calorie malnutrition ([place]) ?  Pressure injury of skin ?  Ulcer of right leg, limited to breakdown of skin ([place]) ?  Below-knee amputation of right lower extremity ([place]) ?  Endocarditis ?  Dehiscence of amputation stump of right lower extremity ([facility]) ?  Goals of care, counseling/discussion ?  Palliative care by specialist ? ?Lumbar back pain w/ L2-3 severe spinal stenosis ?Radiculopathy of left lower extremity: ?CT-moderate neuroforaminal stenosis at L2-3 and L5-S1 - MRI lS-subacute fractures L2-L3 disc and severe multifactorial spinal stenosis at L2-3, followed by Dr. Saintclair Halsted neurosurgery IR completed disc aspiration 3/10 with no evidence of infection,s/p completion of IV Decadron.Initially there was a plan for the patient to go to surgery for decompression of an area of spinal stenosis, but ultimately it was felt that his risk for surgery was too high and the decision was made to cancel surgery.Now s/p amputation of right leg.  Waxes and wanes on agreeableness for SN ?Palliative care consulted, at this time goal is for pain management PT OT-have recommended skilled nursing facility.  Neurontin started for neuropathic pain and on narcotics adjusted for back pain.Continue to fine-tune pain management, encourage ambulation movement/transfer as tolerated PT OT, continue Neurontin titrate to dose can max out 300 daily as twice daily caused tremors.  Appreciate palliative care consult.  Left leg continues to get weaker and he has been refusing PT OT at times. ? ?  PVD with dry gangrene of right transtibial amputation site ?Left metatarsal wound poorly healing- fb Dr [name], last seen as OP 01/10/22 for BLE weeping wound: ?[name] as outpatient initially suggested bilateral lower extremity AKA but arterial studies however medically  cleared AKA not possible on left due to arterial graft, now Rt AKA 3/22- and on wound vac.  Ongoing observation of the left with wound care. ? ?ESRD on HD TTS nephro on board for HD.  During dialysis plan is to see if he tolerates dialysis on chair so that he can get outpatient dialysis.  Today he is getting dialysis on hospital bed.  Continue his Lasix. ?Secondary hyperparathyroidism: Phosphorus at goal continue calcitriol. ?Anemia of chronic renal disease/chronic disease:hb stable. Got aranesp ?Recent Labs  ?Lab 02/02/22 ?0310 02/03/22 ?2355 02/05/22 ?7322 02/06/22 ?0254 02/08/22 ?0802  ?HGB 9.0* 8.8* 10.3* 10.3* 10.1*  ?HCT 29.8* 28.9* 32.9* 33.3* 32.5*  ?  ?YHC:WCBJSEGB Lopressor and Eliquis , rate is controlled.  Seen by cardiology.   ?HTN: BP is well controlled. ?HLD continue statin ?T2DM blood sugar controlled monitor as below with SSI ?Recent Labs  ?Lab 02/07/22 ?1147 02/07/22 ?1156 02/07/22 ?1628 02/07/22 ?2032 02/07/22 ?2136  ?GLUCAP 116* 124* 213* 73 83  ? ?Chronic pacemaker infection ?Severe TR with flail tricuspid valve leaflet ?Possible vegetation on anterior mitral leaflet: ?Followed by ID outpatient setting continue suppressive therapy with doxycycline and doxycycline indefinitely ?GERD- cont ppi ?Pressure wound see below ? ?DVT prophylaxis: SCD's Start: 01/31/22 1523.ELiquis ?Code Status:   Code Status: Full Code ?Family Communication: Plan of care discussed with patient at bedside. ?Patient status is: inpatient Level of care: Med-Surg  ?Remains inpatient because: Ongoing pain management, disposition is ?Patient currently not stable ? ?Dispo: The patient is from: home ?           Anticipated disposition: SNF.  Today he told me he wants to go to SNF, yesterday to palliative care he said was hoping to go home. ? ?Mobility Assessment (last 72 hours)   ? ? Mobility Assessment   ? ? [place] Name 02/07/22 1100 02/07/22 1047 02/06/22 0730 02/05/22 1300 02/05/22 1200  ? Does patient have an order for bedrest or  is patient medically unstable -- -- No - Continue assessment No - Continue assessment --  ? What is the highest level of mobility based on the progressive mobility assessment? Level 2 ([place]) - Balance while sitting on edge of bed and cannot stand Level 2 ([place]) - Balance while sitting on edge of bed and cannot stand Level 2 ([place]) - Balance while sitting on edge of bed and cannot stand Level 2 ([place]) - Balance while sitting on edge of bed and cannot stand Level 2 ([place]) - Balance while sitting on edge of bed and cannot stand  ? Is the above level different from baseline mobility prior to current illness? -- -- Yes - Recommend PT order Yes - Recommend PT order --  ? ?  ?  ? ?  ?Objective: ?Vitals last 24 hrs: ?Vitals:  ? 02/08/22 0900 02/08/22 0930 02/08/22 1000 02/08/22 1030  ?BP: (!) 150/55 (!) 95/42 (!) 115/54 (!) 105/48  ?Pulse: 95 (!) 107 (!) 117 85  ?Resp:      ?Temp:      ?TempSrc:      ?SpO2:      ?Weight:      ?Height:      ?Weight change:  ?Physical Examination: ?General exam:AAOX3,older than stated age, weak appearing. ?HEENT:Oral mucosa moist, Ear/Nose  WNL grossly, dentition normal. ?Respiratory system:Bilaterally diminished,no use of accessory muscle. ?Cardiovascular system:S1 & S2 +, No JVD. ?Gastrointestinal system:Abdomen soft,NT,ND, BS+. ?Nervous System:Alert, awake, moving extremities and grossly non-focal. ?Extremities:Rt AKA with wound vac,Lt TMA w/ dressing intact.  ?Skin:No rashes,no icterus. ?IWO:EHOZYY muscle bulk,tone, power. ? ?Medications reviewed:  ?Scheduled Meds: ? (feeding supplement) PROSource Plus  30 mL Oral BID BM  ? amoxicillin  500 mg Oral Q24H  ? apixaban  5 mg Oral BID  ? vitamin C  1,000 mg Oral Daily  ? atorvastatin  20 mg Oral QHS  ? calcitRIOL  0.25 mcg Oral Daily  ? darbepoetin (ARANESP) injection - DIALYSIS  100 mcg Intravenous Q Thu-HD  ? docusate sodium  100 mg Oral Daily  ? doxycycline  100 mg Oral Q12H  ? feeding supplement (NEPRO CARB STEADY)   237 mL Oral BID BM  ? furosemide  80 mg Oral Once per day on Sun Mon Wed Fri  ? gabapentin  200 mg Oral QHS  ? insulin aspart  0-6 Units Subcutaneous TID WC  ? insulin aspart protamine- aspart  10 Units Su

## 2022-02-08 NOTE — Procedures (Signed)
I was present at this dialysis session. I have reviewed the session itself and made appropriate changes.  ? ?Not in the chair today.  Move to 3K bath with K of 4.0.  Using AVF. UF goal 2.5L.  Hb stable.  Pt w/o c/o  RS ? ? ?Filed Weights  ? 02/06/22 0709 02/06/22 1213 02/08/22 0740  ?Weight: 84 kg 82.6 kg 83.2 kg  ? ? ?Recent Labs  ?Lab 02/08/22 ?0801  ?NA 134*  ?K 4.0  ?CL 97*  ?CO2 26  ?GLUCOSE 165*  ?BUN 42*  ?CREATININE 3.22*  ?CALCIUM 8.6*  ?PHOS 4.3  ? ? ?Recent Labs  ?Lab 02/05/22 ?0842 02/06/22 ?2233 02/08/22 ?0802  ?WBC 10.4 10.5 10.5  ?HGB 10.3* 10.3* 10.1*  ?HCT 32.9* 33.3* 32.5*  ?MCV 95.9 95.1 95.0  ?PLT 182 204 192  ? ? ?Scheduled Meds: ? (feeding supplement) PROSource Plus  30 mL Oral BID BM  ? amoxicillin  500 mg Oral Q24H  ? apixaban  5 mg Oral BID  ? vitamin C  1,000 mg Oral Daily  ? atorvastatin  20 mg Oral QHS  ? calcitRIOL  0.25 mcg Oral Daily  ? darbepoetin (ARANESP) injection - DIALYSIS  100 mcg Intravenous Q Thu-HD  ? docusate sodium  100 mg Oral Daily  ? doxycycline  100 mg Oral Q12H  ? feeding supplement (NEPRO CARB STEADY)  237 mL Oral BID BM  ? furosemide  80 mg Oral Once per day on Sun Mon Wed Fri  ? gabapentin  200 mg Oral QHS  ? insulin aspart  0-6 Units Subcutaneous TID WC  ? insulin aspart protamine- aspart  10 Units Subcutaneous BID WC  ? lidocaine  1 patch Transdermal Q24H  ? metoprolol tartrate  25 mg Oral BID  ? multivitamin with minerals  1 tablet Oral Once per day on Mon Thu  ? nutrition supplement (JUVEN)  1 packet Oral BID BM  ? pantoprazole  40 mg Oral Daily  ? sodium chloride flush  3 mL Intravenous Q12H  ? zinc sulfate  220 mg Oral Daily  ? ?Continuous Infusions: ? sodium chloride    ? sodium chloride    ? sodium chloride    ? sodium chloride    ? magnesium sulfate bolus IVPB    ? ?PRN Meds:.sodium chloride, sodium chloride, sodium chloride, sodium chloride, alum & mag hydroxide-simeth, bisacodyl, fentaNYL (SUBLIMAZE) injection, guaiFENesin-dextromethorphan, heparin,  hydrALAZINE, HYDROmorphone (DILAUDID) injection, HYDROmorphone, labetalol, lidocaine (PF), lidocaine-prilocaine, magnesium sulfate bolus IVPB, methocarbamol, metoprolol tartrate, Muscle Rub, ondansetron **OR** ondansetron (ZOFRAN) IV, ondansetron, oxyCODONE, oxyCODONE, pentafluoroprop-tetrafluoroeth, phenol, polyethylene glycol, potassium chloride, sodium chloride, sodium chloride flush, traMADol, traZODone   ?Pearson Grippe  MD ?02/08/2022, 9:09 AM ?  ?

## 2022-02-09 DIAGNOSIS — E1122 Type 2 diabetes mellitus with diabetic chronic kidney disease: Secondary | ICD-10-CM | POA: Diagnosis not present

## 2022-02-09 DIAGNOSIS — I4821 Permanent atrial fibrillation: Secondary | ICD-10-CM | POA: Diagnosis not present

## 2022-02-09 DIAGNOSIS — M5416 Radiculopathy, lumbar region: Secondary | ICD-10-CM | POA: Diagnosis not present

## 2022-02-09 DIAGNOSIS — E1151 Type 2 diabetes mellitus with diabetic peripheral angiopathy without gangrene: Secondary | ICD-10-CM | POA: Diagnosis not present

## 2022-02-09 DIAGNOSIS — I1311 Hypertensive heart and chronic kidney disease without heart failure, with stage 5 chronic kidney disease, or end stage renal disease: Secondary | ICD-10-CM | POA: Diagnosis not present

## 2022-02-09 DIAGNOSIS — N186 End stage renal disease: Secondary | ICD-10-CM | POA: Diagnosis not present

## 2022-02-09 DIAGNOSIS — Z992 Dependence on renal dialysis: Secondary | ICD-10-CM | POA: Diagnosis not present

## 2022-02-09 DIAGNOSIS — E7800 Pure hypercholesterolemia, unspecified: Secondary | ICD-10-CM | POA: Diagnosis not present

## 2022-02-09 LAB — GLUCOSE, CAPILLARY
Glucose-Capillary: 149 mg/dL — ABNORMAL HIGH (ref 70–99)
Glucose-Capillary: 162 mg/dL — ABNORMAL HIGH (ref 70–99)
Glucose-Capillary: 88 mg/dL (ref 70–99)
Glucose-Capillary: 109 mg/dL — ABNORMAL HIGH (ref 70–99)

## 2022-02-09 MED ORDER — CHLORHEXIDINE GLUCONATE CLOTH 2 % EX PADS
6.0000 | MEDICATED_PAD | Freq: Every day | CUTANEOUS | Status: DC
  Administered 2022-02-10 – 2022-02-12 (×3): 6 via TOPICAL

## 2022-02-09 NOTE — Progress Notes (Addendum)
?[facility] ?Progress Note  ? ?Subjective:    ?Seen in room. Dialyzed yesterday, 02/08/22 (UF 2.5). Primary team recommending SNF placement. No dialysis in chair yesterday, will attempt again 4/1. Patient says he wants to be discharged and knows in order for this to happen, he needs to have dialysis in the chair.  ? ?Objective ?Vitals:  ? 02/08/22 1724 02/08/22 2032 02/09/22 0458 02/09/22 7353  ?BP: 105/69 105/61 120/67 114/63  ?Pulse: 89 86 94 89  ?Resp: '18 16 18 18  '$ ?Temp: 98.3 ?F (36.8 ?C) 97.7 ?F (36.5 ?C) 98 ?F (36.7 ?C) 98.2 ?F (36.8 ?C)  ?TempSrc:  Oral Oral   ?SpO2:  100% 99% 99%  ?Weight:      ?Height:      ? ?Physical Exam ?General: Chronically ill appearing male in NAD.  ?Heart: S1,S2 RRR. No M/R/G ?Lungs: CTAB ?Abdomen: obese, NT. NABS ?Extremities: LLE wrapped in una boots. L toe amps. New R AKA with ACE wrap, WV present. Trace edema LLE.  ?Dialysis Access: R AVF + T/B  ? ?Additional Objective ?Labs: ?Basic Metabolic Panel: ?Recent Labs  ?Lab 02/03/22 ?0725 02/05/22 ?2992 02/06/22 ?4268 02/08/22 ?0801  ?NA 133* 135 136 134*  ?K 3.8 3.6 3.6 4.0  ?CL 96* 96* 98 97*  ?CO2 '26 27 25 26  '$ ?GLUCOSE 141* 138* 141* 165*  ?BUN 32* 35* 41* 42*  ?CREATININE 3.39* 3.40* 3.58* 3.22*  ?CALCIUM 8.1* 8.5* 8.6* 8.6*  ?PHOS 5.3*  --  5.5* 4.3  ? ?Liver Function Tests: ?Recent Labs  ?Lab 02/03/22 ?0725 02/06/22 ?3419 02/08/22 ?0801  ?ALBUMIN 2.0* 2.1* 2.2*  ? ?No results for input(s): LIPASE, AMYLASE in the last 168 hours. ?CBC: ?Recent Labs  ?Lab 02/03/22 ?0724 02/05/22 ?6222 02/06/22 ?9798 02/08/22 ?0802  ?WBC 8.5 10.4 10.5 10.5  ?HGB 8.8* 10.3* 10.3* 10.1*  ?HCT 28.9* 32.9* 33.3* 32.5*  ?MCV 97.3 95.9 95.1 95.0  ?PLT PLATELET CLUMPS NOTED ON SMEAR, UNABLE TO ESTIMATE 182 204 192  ? ?Blood Culture ?   ?Component Value Date/Time  ? SDES WOUND 01/19/2022 1452  ? SPECREQUEST L3/L4 [place] ASPIRATION 01/19/2022 1452  ? CULT  01/19/2022 1452  ?  No growth aerobically or anaerobically. ?Performed at [facility], [place] [address], [place], [place] [address] ?  ? REPTSTATUS 01/24/2022 FINAL 01/19/2022 1452  ? ? ?Cardiac Enzymes: ?No results for input(s): CKTOTAL, CKMB, CKMBINDEX, TROPONINI in the last 168 hours. ?CBG: ?Recent Labs  ?Lab 02/07/22 ?2136 02/08/22 ?1304 02/08/22 ?1623 02/08/22 ?2036 02/09/22 ?0745  ?GLUCAP 83 115* 182* 138* 149*  ? ?Iron Studies: No results for input(s): IRON, TIBC, TRANSFERRIN, FERRITIN in the last 72 hours. ?'@lablastinr3'$ @ ?Studies/Results: ?No results found. ?Medications: ? sodium chloride    ? sodium chloride    ? magnesium sulfate bolus IVPB    ? ? (feeding supplement) PROSource Plus  30 mL Oral BID BM  ? amoxicillin  500 mg Oral Q24H  ? apixaban  5 mg Oral BID  ? vitamin C  1,000 mg Oral Daily  ? atorvastatin  20 mg Oral QHS  ? calcitRIOL  0.25 mcg Oral Daily  ? darbepoetin (ARANESP) injection - DIALYSIS  100 mcg Intravenous Q Thu-HD  ? docusate sodium  100 mg Oral Daily  ? doxycycline  100 mg Oral Q12H  ? feeding supplement (NEPRO CARB STEADY)  237 mL Oral BID BM  ? furosemide  80 mg Oral Once per day on Sun Mon Wed Fri  ? gabapentin  200 mg Oral  QHS  ? insulin aspart  0-6 Units Subcutaneous TID WC  ? insulin aspart protamine- aspart  10 Units Subcutaneous BID WC  ? lidocaine  1 patch Transdermal Q24H  ? metoprolol tartrate  25 mg Oral BID  ? multivitamin with minerals  1 tablet Oral Once per day on Mon Thu  ? nutrition supplement (JUVEN)  1 packet Oral BID BM  ? pantoprazole  40 mg Oral Daily  ? sodium chloride flush  3 mL Intravenous Q12H  ? zinc sulfate  220 mg Oral Daily  ? ?Dialysis Orders: ?TTS - GOC ?4hrs16mn, BFR 400, DFR 800,  EDW 94kg, 3K/ 2.5Ca ?-No Heparin ?-Mircera 200 mcg IV q 2wks - last 01/11/22 ?-Calcitriol 0.217m PO TIW ? ?Assessment/Plan: ?1 Lumbar back pain/Radiculopathy: Followed by Spine Surgery Dr. CrSaintclair HalstedMR Lumbar Spine completed: showed fractures L2-L3 with fluid-? Trauma vs. Infection. Neurosurgeon canceled surgery secondary to high risk. L2-3 disc aspiration  01/19/2022 culture negative.Continue pain management and Pt/OT. Eventually as outpt either will need PMD or pain clinic to manage.  ?2. PVD with Dry gangrene:R AKA on Wednesday 01/31/2022 per Dr. DuSharol GivenWill continue to observe LLE.  ?3. ESRD - on HD TTS. Next HD 02/10/2022. No heparin. HD IN CHAIR! ?4. Hypertension/volume  -BP & Volume is stable.Lower EDW on discharge. Have attempted to set new EDW but gets under new wt.  UF as tolerated.  ?5. Anemia of CKD - Hgb 10.1 02/08/2022. Given Aranesp 100 mcg IV 02/01/2022. Follow HGB. Transfuse PRN.  ? 6. Secondary Hyperparathyroidism - PO4 at goal. Continue VDRA. ?7. Nutrition - Renal diet on fluid restriction. Albumin very low. On protein supps, renal vitamins  ?8. DMT2-per primary ?9. 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 ABX. Follows with cards. ? ?JoJan FiremanPA-S2 ? ?Seen with PA-S, as above. Agree with assessment/plan. ?Wynona Duhamel H. Aurilla Coulibaly NP-C ?02/09/2022, 10:34 AM  ?[facility] ?[phone]

## 2022-02-09 NOTE — Progress Notes (Signed)
?PROGRESS NOTE ?Alexander [name]  OTL:[id number] DOB: [birth year] DOA: 01/13/2022 ?PCP: [name], MD  ? ?Brief Narrative/Hospital Course: ?As per prior documentation: "Alexander [name] is a 61 y.o. male with medical history significant for ESRD on HD on TTS, HTN, HLD, PVD status post right BKA and left midfoot amputation, Afib on Eliquis, CAD, DM2, and status post pacemaker, presented to the ER with worsening intractable low back pain over the last couple of days with associated left lower extremity radiation and radiculopathy with associated spasm and weakness, denies any urinary or stool incontinence. He has been having chronic low back pain with occasional radiation to both lower extremities. Pt has been followed by spine surgery and was scheduled for LS spine MRI on 02/07/2022.  Plan is for disc aspiration and if no infection then patient will have surgical decompression.  Aspiration done 3/10.Initially there was a plan for the patient to go to surgery for decompression of an area of spinal stenosis, but ultimately it was felt that his risk for surgery was too high and the decision was made to cancel surgery.Now s/p amputation of right leg.  Waxes and wanes on agreeableness for SNF.  Family not able to take home. Appreciate Dr. Irven Shelling help with [place].   ?Focus now is on pain control PMT-pain clinic appointment". ? ?02/09/2022: Patient seen alongside 2 daughters.  No new complaints. ?  ?Subjective: ?No shortness of breath or chest pain. ?No worsening of the back pain. ? ?Assessment and Plan: ?Principal Problem: ?  Lumbar back pain with radiculopathy affecting left lower extremity ?Active Problems: ?  Essential hypertension, benign ?  Gangrene of extremity due to atherosclerosis of artery ([place]) ?  ESRD on dialysis [facility]) ?  PAF (paroxysmal atrial fibrillation) ([place]) ?  Pacemaker infection ([place]) ?  Severe protein-calorie malnutrition ([place]) ?  Type 2 diabetes mellitus with chronic kidney disease, with long-term current  use of insulin ([place]) ?  GERD without esophagitis ?  Pressure injury of skin ?  Ulcer of right leg, limited to breakdown of skin ([place]) ?  Below-knee amputation of right lower extremity ([place]) ?  Endocarditis ?  Dehiscence of amputation stump of right lower extremity ([facility]) ?  Goals of care, counseling/discussion ?  Palliative care by specialist ? ?Lumbar back pain w/ L2-3 severe spinal stenosis ?Radiculopathy of left lower extremity: ?CT-moderate neuroforaminal stenosis at L2-3 and L5-S1 - MRI lS-subacute fractures L2-L3 disc and severe multifactorial spinal stenosis at L2-3, followed by Dr. Saintclair Halsted neurosurgery IR completed disc aspiration 3/10 with no evidence of infection,s/p completion of IV Decadron.Initially there was a plan for the patient to go to surgery for decompression of an area of spinal stenosis, but ultimately it was felt that his risk for surgery was too high and the decision was made to cancel surgery.Now s/p amputation of right leg.  Waxes and wanes on agreeableness for SN ?Palliative care consulted, at this time goal is for pain management PT OT-have recommended skilled nursing facility.  Neurontin started for neuropathic pain and on narcotics adjusted for back pain.Continue to fine-tune pain management, encourage ambulation movement/transfer as tolerated PT OT, continue Neurontin titrate to dose can max out 300 daily as twice daily caused tremors.  Appreciate palliative care consult.  Left leg continues to get weaker and he has been refusing PT OT at times. ?02/09/2022: No new changes.  Pain is controlled. ? ?PVD with dry gangrene of right transtibial amputation site ?Left metatarsal wound poorly healing- fb Dr [name], last seen as  OP 01/10/22 for BLE weeping wound: ?[name] as outpatient initially suggested bilateral lower extremity AKA but arterial studies however medically cleared AKA not possible on left due to arterial graft, now Rt AKA 3/22- and on wound vac.  Ongoing observation of the left with  wound care. ? ?ESRD on HD TTS nephro on board for HD.  During dialysis plan is to see if he tolerates dialysis on chair so that he can get outpatient dialysis.  Today he is getting dialysis on hospital bed.  Continue his Lasix. ?Secondary hyperparathyroidism: Phosphorus at goal continue calcitriol. ?Anemia of chronic renal disease/chronic disease:hb stable. Got aranesp ?Recent Labs  ?Lab 02/03/22 ?0724 02/05/22 ?3016 02/06/22 ?0109 02/08/22 ?0802  ?HGB 8.8* 10.3* 10.3* 10.1*  ?HCT 28.9* 32.9* 33.3* 32.5*  ? ?  ?NAT:FTDDUKGU Lopressor and Eliquis , rate is controlled.  Seen by cardiology.   ?HTN: BP is well controlled. ?HLD continue statin ?T2DM blood sugar controlled monitor as below with SSI ?Recent Labs  ?Lab 02/08/22 ?1304 02/08/22 ?1623 02/08/22 ?2036 02/09/22 ?0745 02/09/22 ?1143  ?GLUCAP 115* 182* 138* 149* 162*  ? ? ?Chronic pacemaker infection ?Severe TR with flail tricuspid valve leaflet ?Possible vegetation on anterior mitral leaflet: ?Followed by ID outpatient setting continue suppressive therapy with doxycycline and doxycycline indefinitely ?GERD- cont ppi ?Pressure wound see below ? ?DVT prophylaxis: SCD's Start: 01/31/22 1523.ELiquis ?Code Status:   Code Status: Full Code ?Family Communication: Plan of care discussed with patient at bedside. ?Patient status is: inpatient Level of care: Med-Surg  ?Remains inpatient because: Ongoing pain management, disposition is ?Patient currently not stable ? ?Dispo: The patient is from: home ?           Anticipated disposition: SNF.  Today he told me he wants to go to SNF, yesterday to palliative care he said was hoping to go home. ? ?Mobility Assessment (last 72 hours)   ? ? Mobility Assessment   ? ? [place] Name 02/09/22 1000 02/08/22 2000 02/08/22 1400 02/07/22 1100 02/07/22 1047  ? Does patient have an order for bedrest or is patient medically unstable No - Continue assessment No - Continue assessment No - Continue assessment -- --  ? What is the highest level of  mobility based on the progressive mobility assessment? -- Level 2 ([place]) - Balance while sitting on edge of bed and cannot stand Level 2 ([place]) - Balance while sitting on edge of bed and cannot stand Level 2 ([place]) - Balance while sitting on edge of bed and cannot stand Level 2 ([place]) - Balance while sitting on edge of bed and cannot stand  ? Is the above level different from baseline mobility prior to current illness? -- Yes - Recommend PT order Yes - Recommend PT order -- --  ? ?  ?  ? ?  ?Objective: ?Vitals last 24 hrs: ?Vitals:  ? 02/08/22 1724 02/08/22 2032 02/09/22 0458 02/09/22 0918  ?BP: 105/69 105/61 120/67 114/63  ?Pulse: 89 86 94 89  ?Resp: '18 16 18 18  '$ ?Temp: 98.3 ?F (36.8 ?C) 97.7 ?F (36.5 ?C) 98 ?F (36.7 ?C) 98.2 ?F (36.8 ?C)  ?TempSrc:  Oral Oral   ?SpO2:  100% 99% 99%  ?Weight:      ?Height:      ?Weight change:  ?Physical Examination: ?General exam: Awake and alert.  Not in any distress. ?HEENT: Mild pallor.  No jaundice.   ?Lungs: Clear to auscultation. ?CVS: S1-S2. ?Abdomen: Soft and nontender. ?Neuro: Awake and alert.  Moves all extremities. ?Extremities:  Status post recent right above-knee amputation.  Wound VAC to the stump.  S/p amputation of the left forefoot.  Unna boot to the left lower leg. ? ? ?Medications reviewed:  ?Scheduled Meds: ? (feeding supplement) PROSource Plus  30 mL Oral BID BM  ? amoxicillin  500 mg Oral Q24H  ? apixaban  5 mg Oral BID  ? vitamin C  1,000 mg Oral Daily  ? atorvastatin  20 mg Oral QHS  ? calcitRIOL  0.25 mcg Oral Daily  ? [START ON 02/10/2022] Chlorhexidine Gluconate Cloth  6 each Topical Q0600  ? darbepoetin (ARANESP) injection - DIALYSIS  100 mcg Intravenous Q Thu-HD  ? docusate sodium  100 mg Oral Daily  ? doxycycline  100 mg Oral Q12H  ? feeding supplement (NEPRO CARB STEADY)  237 mL Oral BID BM  ? furosemide  80 mg Oral Once per day on Sun Mon Wed Fri  ? gabapentin  200 mg Oral QHS  ? insulin aspart  0-6 Units Subcutaneous TID WC  ?  insulin aspart protamine- aspart  10 Units Subcutaneous BID WC  ? lidocaine  1 patch Transdermal Q24H  ? metoprolol tartrate  25 mg Oral BID  ? multivitamin with minerals  1 tablet Oral Once per day on Mon Thu

## 2022-02-09 NOTE — Plan of Care (Signed)
  Problem: Health Behavior/Discharge Planning: Goal: Ability to manage health-related needs will improve Outcome: Progressing   Problem: Clinical Measurements: Goal: Ability to maintain clinical measurements within normal limits will improve Outcome: Progressing Goal: Will remain free from infection Outcome: Progressing Goal: Diagnostic test results will improve Outcome: Progressing Goal: Respiratory complications will improve Outcome: Progressing Goal: Cardiovascular complication will be avoided Outcome: Progressing   Problem: Activity: Goal: Risk for activity intolerance will decrease Outcome: Progressing   Problem: Nutrition: Goal: Adequate nutrition will be maintained Outcome: Progressing   Problem: Coping: Goal: Level of anxiety will decrease Outcome: Progressing   Problem: Elimination: Goal: Will not experience complications related to bowel motility Outcome: Progressing Goal: Will not experience complications related to urinary retention Outcome: Progressing   Problem: Pain Managment: Goal: General experience of comfort will improve Outcome: Progressing   Problem: Safety: Goal: Ability to remain free from injury will improve Outcome: Progressing   Problem: Skin Integrity: Goal: Risk for impaired skin integrity will decrease Outcome: Progressing   Problem: Education: Goal: Knowledge of General Education information will improve Description: Including pain rating scale, medication(s)/side effects and non-pharmacologic comfort measures Outcome: Progressing   Problem: Health Behavior/Discharge Planning: Goal: Ability to manage health-related needs will improve Outcome: Progressing   Problem: Clinical Measurements: Goal: Ability to maintain clinical measurements within normal limits will improve Outcome: Progressing Goal: Will remain free from infection Outcome: Progressing Goal: Diagnostic test results will improve Outcome: Progressing Goal:  Respiratory complications will improve Outcome: Progressing Goal: Cardiovascular complication will be avoided Outcome: Progressing   Problem: Activity: Goal: Risk for activity intolerance will decrease Outcome: Progressing   Problem: Nutrition: Goal: Adequate nutrition will be maintained Outcome: Progressing   Problem: Coping: Goal: Level of anxiety will decrease Outcome: Progressing   Problem: Elimination: Goal: Will not experience complications related to bowel motility Outcome: Progressing Goal: Will not experience complications related to urinary retention Outcome: Progressing   Problem: Pain Managment: Goal: General experience of comfort will improve Outcome: Progressing   Problem: Safety: Goal: Ability to remain free from injury will improve Outcome: Progressing   Problem: Skin Integrity: Goal: Risk for impaired skin integrity will decrease Outcome: Progressing   

## 2022-02-09 NOTE — Progress Notes (Signed)
Physical Therapy Treatment ?Patient Details ?Name: Alexander [name] ?MRN: [id number] ?DOB: [birth year] ?Today's Date: 02/09/2022 ? ? ?History of Present Illness The pt is a 61 yo male presenting 3/4 with chronic back pain and weakness to LLE in addition to frequent falls (x2 on day of admission). Pt found to have severe L2-L3 stenosis but surgery was too high risk. Now s/p R AKA on 3/22. PMH: ESRD on HD TTS, paroxysmal atrial fibrillation on Eliquis, pacemaker, CAD s/p stent, diastolic CHF, PVD with right BKA and left midfoot amputation, history of GI bleed, and DM II. ? ?  ?PT Comments  ? ? Patient able to progress OOB into chair this date via [place]. Attempted lateral scoots on EOB but required maxA. Patient complaining of pain immediately upon sitting in the chair. Daughters present. Educated patient to try to sit up for at least 30 minutes - 1 hour for first time being up, patient agreed. Daughters and patient are in agreement for SNF rehab prior to returning home. Continue to recommend SNF for ongoing Physical Therapy.    ?   ?Recommendations for follow up therapy are one component of a multi-disciplinary discharge planning process, led by the attending physician.  Recommendations may be updated based on patient status, additional functional criteria and insurance authorization. ? ?Follow Up Recommendations ? Skilled nursing-short term rehab (<3 hours/day) ?  ?  ?Assistance Recommended at Discharge Frequent or constant Supervision/Assistance  ?Patient can return home with the following Two people to help with walking and/or transfers;Help with stairs or ramp for entrance ?  ?Equipment Recommendations ? Hospital bed;Other (comment) (hoyer lift)  ?  ?Recommendations for Other Services   ? ? ?  ?Precautions / Restrictions Precautions ?Precautions: Fall;Other (comment) ?Precaution Comments: new R AKA w/ wound vac, hx of left midfoot amputation,  also with new lumbar stenosis ?Restrictions ?Weight Bearing Restrictions:  Yes ?RLE Weight Bearing: Non weight bearing  ?  ? ?Mobility ? Bed Mobility ?Overal bed mobility: Needs Assistance ?Bed Mobility: Rolling, Sidelying to Sit ?Rolling: Min assist ?Sidelying to sit: Mod assist, +2 for safety/equipment ?  ?  ?  ?  ?  ? ?Transfers ?Overall transfer level: Needs assistance ?  ?Transfers: Bed to chair/wheelchair/BSC ?  ?  ?  ?  ?  ? Lateral/Scoot Transfers: Max assist ?General transfer comment: initiated lateral scoots along EOB with maxA. Patient agreeable to hoyer OOB to recliner in preparation to tolerate HD in chair. Patient tolerated well but complaining of pain immediately in chair ?Transfer via Lift Equipment: [place] ? ?Ambulation/Gait ?  ?  ?  ?  ?  ?  ?  ?  ? ? ?Stairs ?  ?  ?  ?  ?  ? ? ?Wheelchair Mobility ?  ? ?Modified Rankin (Stroke Patients Only) ?  ? ? ?  ?Balance Overall balance assessment: Needs assistance ?Sitting-balance support: Feet supported, Bilateral upper extremity supported ?Sitting balance-Leahy Scale: Fair ?  ?  ?  ?  ?  ?  ?  ?  ?  ?  ?  ?  ?  ?  ?  ?  ?  ? ?  ?Cognition Arousal/Alertness: Awake/alert ?Behavior During Therapy: [facility] for tasks assessed/performed ?Overall Cognitive Status: Within Functional Limits for tasks assessed ?  ?  ?  ?  ?  ?  ?  ?  ?  ?  ?  ?  ?  ?  ?  ?  ?  ?  ?  ? ?  ?Exercises   ? ?  ?  General Comments   ?  ?  ? ?Pertinent Vitals/Pain Pain Assessment ?Pain Assessment: Faces ?Faces Pain Scale: Hurts even more ?Pain Location: back ?Pain Descriptors / Indicators: Grimacing, Guarding ?Pain Intervention(s): Monitored during session  ? ? ?Home Living   ?  ?  ?  ?  ?  ?  ?  ?  ?  ?   ?  ?Prior Function    ?  ?  ?   ? ?PT Goals (current goals can now be found in the care plan section) Acute Rehab PT Goals ?Patient Stated Goal: to be able to get stronger and go home ?PT Goal Formulation: With patient ?Time For Goal Achievement: 02/15/22 ?Potential to Achieve Goals: Fair ?Progress towards PT goals: Progressing toward goals ? ?  ?Frequency ? ? ?  Min 3X/week ? ? ? ?  ?PT Plan Current plan remains appropriate  ? ? ?Co-evaluation   ?  ?  ?  ?  ? ?  ?AM-PAC PT "6 Clicks" Mobility   ?Outcome Measure ? Help needed turning from your back to your side while in a flat bed without using bedrails?: A Little ?Help needed moving from lying on your back to sitting on the side of a flat bed without using bedrails?: A Lot ?Help needed moving to and from a bed to a chair (including a wheelchair)?: Total ?Help needed standing up from a chair using your arms (e.g., wheelchair or bedside chair)?: Total ?Help needed to walk in hospital room?: Total ?Help needed climbing 3-5 steps with a railing? : Total ?6 Click Score: 9 ? ?  ?End of Session   ?Activity Tolerance: Patient tolerated treatment well ?Patient left: in chair;with call bell/phone within reach;with chair alarm set ?Nurse Communication: Mobility status ?PT Visit Diagnosis: Pain;Unsteadiness on feet (R26.81);Repeated falls (R29.6) ?  ? ? ?Time: 3300-7622 ?PT Time Calculation (min) (ACUTE ONLY): 29 min ? ?Charges:  $Therapeutic Activity: 23-37 mins          ?          ? ?Karene Bracken A. Gilford Rile, PT, DPT ?Acute Rehabilitation Services ?Pager [phone] ?Office [phone] ? ? ? ?[name] ?02/09/2022, 4:56 PM ? ?

## 2022-02-10 DIAGNOSIS — M5416 Radiculopathy, lumbar region: Secondary | ICD-10-CM | POA: Diagnosis not present

## 2022-02-10 LAB — GLUCOSE, CAPILLARY
Glucose-Capillary: 112 mg/dL — ABNORMAL HIGH (ref 70–99)
Glucose-Capillary: 137 mg/dL — ABNORMAL HIGH (ref 70–99)
Glucose-Capillary: 116 mg/dL — ABNORMAL HIGH (ref 70–99)
Glucose-Capillary: 116 mg/dL — ABNORMAL HIGH (ref 70–99)

## 2022-02-10 NOTE — Progress Notes (Signed)
?[facility] ?Progress Note  ? ?Subjective:   Reports he sat in the chair at bedside yesterday and had some leg pain but overall tolerated well. He denies SOB, CP, palpitations, dizziness, abdominal pain and nausea.  ? ?Objective ?Vitals:  ? 02/09/22 0918 02/09/22 1649 02/09/22 2039 02/10/22 0451  ?BP: 114/63 111/70 (!) 110/58 116/68  ?Pulse: 89 91 81 91  ?Resp: '18 17 16 14  '$ ?Temp: 98.2 ?F (36.8 ?C) 98.3 ?F (36.8 ?C) 98.4 ?F (36.9 ?C) 97.9 ?F (36.6 ?C)  ?TempSrc:   Oral Oral  ?SpO2: 99% 93% 95% 100%  ?Weight:      ?Height:      ? ?Physical Exam ?General: Alert and in NAD ?Heart: RRR, no murmurs, rubs or gallops ?Lungs: CTA bilatearlly without wheezing, rhonchi or rales ?Abdomen: Soft, non-distended, +BS ?Extremities: LLE wrapped, R AKA. No edema b/l lower extremities ?Dialysis Access: RUE AVF + T/b ? ?Additional Objective ?Labs: ?Basic Metabolic Panel: ?Recent Labs  ?Lab 02/05/22 ?0842 02/06/22 ?1761 02/08/22 ?0801  ?NA 135 136 134*  ?K 3.6 3.6 4.0  ?CL 96* 98 97*  ?CO2 '27 25 26  '$ ?GLUCOSE 138* 141* 165*  ?BUN 35* 41* 42*  ?CREATININE 3.40* 3.58* 3.22*  ?CALCIUM 8.5* 8.6* 8.6*  ?PHOS  --  5.5* 4.3  ? ?Liver Function Tests: ?Recent Labs  ?Lab 02/06/22 ?6073 02/08/22 ?0801  ?ALBUMIN 2.1* 2.2*  ? ?No results for input(s): LIPASE, AMYLASE in the last 168 hours. ?CBC: ?Recent Labs  ?Lab 02/05/22 ?0842 02/06/22 ?7106 02/08/22 ?0802  ?WBC 10.4 10.5 10.5  ?HGB 10.3* 10.3* 10.1*  ?HCT 32.9* 33.3* 32.5*  ?MCV 95.9 95.1 95.0  ?PLT 182 204 192  ? ?Blood Culture ?   ?Component Value Date/Time  ? SDES WOUND 01/19/2022 1452  ? SPECREQUEST L3/L4 [place] ASPIRATION 01/19/2022 1452  ? CULT  01/19/2022 1452  ?  No growth aerobically or anaerobically. ?Performed at [facility], [place] [address], [place], [place] [address] ?  ? REPTSTATUS 01/24/2022 FINAL 01/19/2022 1452  ? ? ?CBG: ?Recent Labs  ?Lab 02/09/22 ?0745 02/09/22 ?1143 02/09/22 ?1648 02/09/22 ?2038 02/10/22 ?5462  ?GLUCAP 149* 162* 88 109* 112*   ? ? ?Medications: ? sodium chloride    ? sodium chloride    ? magnesium sulfate bolus IVPB    ? ? (feeding supplement) PROSource Plus  30 mL Oral BID BM  ? amoxicillin  500 mg Oral Q24H  ? apixaban  5 mg Oral BID  ? vitamin C  1,000 mg Oral Daily  ? atorvastatin  20 mg Oral QHS  ? calcitRIOL  0.25 mcg Oral Daily  ? Chlorhexidine Gluconate Cloth  6 each Topical Q0600  ? darbepoetin (ARANESP) injection - DIALYSIS  100 mcg Intravenous Q Thu-HD  ? docusate sodium  100 mg Oral Daily  ? doxycycline  100 mg Oral Q12H  ? feeding supplement (NEPRO CARB STEADY)  237 mL Oral BID BM  ? furosemide  80 mg Oral Once per day on Sun Mon Wed Fri  ? gabapentin  200 mg Oral QHS  ? insulin aspart  0-6 Units Subcutaneous TID WC  ? insulin aspart protamine- aspart  10 Units Subcutaneous BID WC  ? lidocaine  1 patch Transdermal Q24H  ? metoprolol tartrate  25 mg Oral BID  ? multivitamin with minerals  1 tablet Oral Once per day on Mon Thu  ? nutrition supplement (JUVEN)  1 packet Oral BID BM  ? pantoprazole  40 mg Oral Daily  ?  sodium chloride flush  3 mL Intravenous Q12H  ? zinc sulfate  220 mg Oral Daily  ? ? ?Dialysis Orders: ?TTS - GOC ?4hrs80mn, BFR 400, DFR 800,  EDW 94kg, 3K/ 2.5Ca ?-No Heparin ?-Mircera 200 mcg IV q 2wks - last 01/11/22 ?-Calcitriol 0.269m PO TIW ? ?Assessment/Plan: ?1 Lumbar back pain/Radiculopathy: Followed by Spine Surgery Dr. CrSaintclair HalstedMR Lumbar Spine completed: showed fractures L2-L3 with fluid-? Trauma vs. Infection. Neurosurgeon canceled surgery secondary to high risk. L2-3 disc aspiration 01/19/2022 culture negative.Continue pain management and Pt/OT.  ?2. PVD with Dry gangrene:R AKA on Wednesday 01/31/2022 per Dr. DuSharol Given ?3. ESRD - on HD TTS. Next HD today No heparin. Discussed with patient that we will need to attempt HD in a chair to make sure he is strong enough for outpatient, he is agreeable.  ?4. Hypertension/volume  -BP & Volume are stable.Lower EDW on discharge. UF as tolerated.  ?5. Anemia of CKD -  Hgb 10.1 02/08/2022. Given Aranesp 100 mcg IV 02/01/2022. Follow HGB. Transfuse PRN.  ? 6. Secondary Hyperparathyroidism - PO4 and calcium at goal. Continue VDRA. ?7. Nutrition - Renal diet on fluid restriction. Albumin is low. On protein supps, renal vitamins  ?8. DMT2-per primary ?9. 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 On ABX. Follows with cardiology.  ? ?SaAnice PaganiniPA-C ?02/10/2022, 9:03 AM  ?[facility] ?Pager: [phone] ? ?

## 2022-02-10 NOTE — Progress Notes (Signed)
?PROGRESS NOTE ? ?Alexander [name] YOV:[id number] DOB: 10/12/1961 DOA: 01/13/2022 ?PCP: [name], MD ? ? LOS: 28 days  ? ?Brief Narrative / Interim history: ?Alexander [name] is a 61 y.o. male with medical history significant for ESRD on HD on TTS, HTN, HLD, PVD status post right BKA and left midfoot amputation, Afib on Eliquis, CAD, DM2, and status post pacemaker, presented to the ER with worsening intractable low back pain with weakness and radiculopathy of his left lower extremity.  He has chronic back pain followed by spine surgery.  There were plans in place to undergo surgery/decompression.  He underwent a disc aspiration infection has been ruled out, but afterwards he was deemed too high risk candidate for spine surgery.  Due to dry gangrene of the right transtibial amputation site, he underwent an AKA on 3/22 by [name].  He is overall stable, awaiting placement to SNF, and needs to ensure that he is able to tolerate dialysis in the chair as he is quite deconditioned currently ? ?Subjective / 24h Interval events: ?Doing well, no chest pain, no shortness of breath.  Will have dialysis today ? ?Assesement and Plan: ?Principal Problem: ?  Lumbar back pain with radiculopathy affecting left lower extremity ?Active Problems: ?  ESRD on dialysis [facility]) ?  PAF (paroxysmal atrial fibrillation) ([place]) ?  Essential hypertension, benign ?  Type 2 diabetes mellitus with chronic kidney disease, with long-term current use of insulin ([place]) ?  GERD without esophagitis ?  Gangrene of extremity due to atherosclerosis of artery ([place]) ?  Pacemaker infection ([place]) ?  Severe protein-calorie malnutrition ([place]) ?  Pressure injury of skin ?  Ulcer of right leg, limited to breakdown of skin ([place]) ?  Below-knee amputation of right lower extremity ([place]) ?  Endocarditis ?  Dehiscence of amputation stump of right lower extremity (H[facility]) ?  Goals of care, counseling/discussion ?  Palliative care by specialist ? ? ?Assessment and  Plan: ?Principal problem ?L2-3 severe spinal stenosis, back pain-with left lower extremity radiculopathy.  CT showed moderate neuroforaminal stenosis at L2-L3, L5-S1.  An MRI showed subacute fractures at the L2-L3 disc and severe multifactorial spinal stenosis.  Neurosurgery consulted, he is deemed too high risk candidate for spinal surgery.  He completed a course of IV Decadron.  IR completed disc aspiration on 3/10 without evidence of an infection.  ID consulted and evaluated patient as well.  Pain reasonably well controlled with oral Dilaudid and Robaxin, appreciate palliative care input.  I see that he has orders for IV fentanyl, p.o. oxycodone but he has not used those.  Discontinue today ? ?Active problems ?Peripheral vascular disease, dry gangrene of the right transtibial amputation site-poor healing, [name] consulted and patient is status post right AKA on 3/22. ? ?Chronic pacemaker infection, possible endocarditis on anterior mitral leaflet-seen by ID as an outpatient, currently he is on suppressive therapy with doxycycline and amoxicillin indefinitely.  Continue ? ?Left metatarsal wound-poor healing, AKA not possible due to arterial graft.  For now managed conservatively, he is on suppressive antibiotics at baseline ? ?ESRD-nephrology following, getting dialysis. ? ?Severe TR with flail tricuspid valve leaflet-noted, volume status managed by dialysis ? ?Chronic systolic CHF-most recent 2D echo done March 2023 shows EF of 30-35%, global hypokinesis of the LV.  RV was normal.  PA pressure was moderately elevated.  Currently appears euvolemic, volume management per HD ? ?History of sinus node dysfunction and complete heart block-status post pacemaker 2015.  Concern for pacemaker infection  on suppressive antibiotics as above ? ?Coronary artery disease-with history of PCI.  Seeing Dr. [name] ? ?Paroxysmal A-fib-continue Lopressor, Eliquis.  Cardiology saw him during this hospital stay ? ?Essential  hypertension-controlled, continue to monitor blood pressure ? ?Hyperlipidemia-continue statin ? ?Anemia of chronic renal disease-no bleeding, continue to monitor ? ?Disposition-extremely deconditioned, PT recommends SNF.  We will try dialyzing in the chair today to see if he could tolerate.  Per chart review he has been back and forth whether he agrees to rehab but he does on my evaluation this morning. ? ?Type 2 diabetes mellitus-continue insulin regimen as below.  Most recent A1c 6.8.  CBGs controlled ? ?CBG (last 3)  ?Recent Labs  ?  02/09/22 ?2038 02/10/22 ?6629 02/10/22 ?1121  ?GLUCAP 109* 112* 137*  ? ? ?Pressure Injury 01/14/22 Ischial tuberosity Right Stage 3 -  Full thickness tissue loss. Subcutaneous fat may be visible but bone, tendon or muscle are NOT exposed. (Active)  ?01/14/22 0321  ?Location: Ischial tuberosity  ?Location Orientation: Right  ?Staging: Stage 3 -  Full thickness tissue loss. Subcutaneous fat may be visible but bone, tendon or muscle are NOT exposed.  ?Wound Description (Comments):   ?Present on Admission: Yes  ?   ?Pressure Injury 01/14/22 Buttocks Right Stage 2 -  Partial thickness loss of dermis presenting as a shallow open injury with a red, pink wound bed without slough. (Active)  ?01/14/22 0322  ?Location: Buttocks  ?Location Orientation: Right  ?Staging: Stage 2 -  Partial thickness loss of dermis presenting as a shallow open injury with a red, pink wound bed without slough.  ?Wound Description (Comments):   ?Present on Admission: Yes  ?   ?Pressure Injury 01/14/22 Buttocks Posterior;Proximal;Right Stage 2 -  Partial thickness loss of dermis presenting as a shallow open injury with a red, pink wound bed without slough. (Active)  ?01/14/22 0324  ?Location: Buttocks  ?Location Orientation: Posterior;Proximal;Right  ?Staging: Stage 2 -  Partial thickness loss of dermis presenting as a shallow open injury with a red, pink wound bed without slough.  ?Wound Description (Comments):    ?Present on Admission: Yes  ? ? ?Scheduled Meds: ? (feeding supplement) PROSource Plus  30 mL Oral BID BM  ? amoxicillin  500 mg Oral Q24H  ? apixaban  5 mg Oral BID  ? vitamin C  1,000 mg Oral Daily  ? atorvastatin  20 mg Oral QHS  ? calcitRIOL  0.25 mcg Oral Daily  ? Chlorhexidine Gluconate Cloth  6 each Topical Q0600  ? darbepoetin (ARANESP) injection - DIALYSIS  100 mcg Intravenous Q Thu-HD  ? docusate sodium  100 mg Oral Daily  ? doxycycline  100 mg Oral Q12H  ? feeding supplement (NEPRO CARB STEADY)  237 mL Oral BID BM  ? furosemide  80 mg Oral Once per day on Sun Mon Wed Fri  ? gabapentin  200 mg Oral QHS  ? insulin aspart  0-6 Units Subcutaneous TID WC  ? insulin aspart protamine- aspart  10 Units Subcutaneous BID WC  ? lidocaine  1 patch Transdermal Q24H  ? metoprolol tartrate  25 mg Oral BID  ? multivitamin with minerals  1 tablet Oral Once per day on Mon Thu  ? nutrition supplement (JUVEN)  1 packet Oral BID BM  ? pantoprazole  40 mg Oral Daily  ? sodium chloride flush  3 mL Intravenous Q12H  ? zinc sulfate  220 mg Oral Daily  ? ?Continuous Infusions: ? sodium chloride    ?  sodium chloride    ? magnesium sulfate bolus IVPB    ? ?PRN Meds:.sodium chloride, sodium chloride, bisacodyl, guaiFENesin-dextromethorphan, hydrALAZINE, HYDROmorphone (DILAUDID) injection, HYDROmorphone, labetalol, lidocaine (PF), lidocaine-prilocaine, magnesium sulfate bolus IVPB, methocarbamol, metoprolol tartrate, Muscle Rub, ondansetron **OR** ondansetron (ZOFRAN) IV, ondansetron, pentafluoroprop-tetrafluoroeth, phenol, polyethylene glycol, sodium chloride, sodium chloride flush, traMADol, traZODone ? ?Diet Orders (From admission, onward)  ? ?  Start     Ordered  ? 01/31/22 1523  Diet renal with fluid restriction Fluid restriction: 1200 mL Fluid; Room service appropriate? Yes; Fluid consistency: Thin  Diet effective now       ?Question Answer Comment  ?Fluid restriction: 1200 mL Fluid   ?Room service appropriate? Yes   ?Fluid  consistency: Thin   ?  ? 01/31/22 1522  ? ?  ?  ? ?  ? ? ?DVT prophylaxis: SCD's Start: 01/31/22 1523 ?apixaban (ELIQUIS) tablet 5 mg  ? ?Lab Results  ?Component Value Date  ? PLT 192 02/08/2022  ? ? ?  Code Status: Full Code ? ?F

## 2022-02-10 NOTE — Progress Notes (Signed)
Patient was transferred from bed to recliner chair with overhead lift.  Patient tolerated activity well.  Needs addressed.  Patient going to HD. ?

## 2022-02-11 DIAGNOSIS — M5416 Radiculopathy, lumbar region: Secondary | ICD-10-CM | POA: Diagnosis not present

## 2022-02-11 LAB — GLUCOSE, CAPILLARY
Glucose-Capillary: 111 mg/dL — ABNORMAL HIGH (ref 70–99)
Glucose-Capillary: 107 mg/dL — ABNORMAL HIGH (ref 70–99)
Glucose-Capillary: 171 mg/dL — ABNORMAL HIGH (ref 70–99)
Glucose-Capillary: 99 mg/dL (ref 70–99)

## 2022-02-11 NOTE — Progress Notes (Signed)
?PROGRESS NOTE ? ?Alexander [name] HBZ:[id number] DOB: Sep 18, 1961 DOA: 01/13/2022 ?PCP: Alexander Pao, MD ? ? LOS: 29 days  ? ?Brief Narrative / Interim history: ?Alexander [name] is a 61 y.o. male with medical history significant for ESRD on HD on TTS, HTN, HLD, PVD status post right BKA and left midfoot amputation, Afib on Eliquis, CAD, DM2, and status post pacemaker, presented to the ER with worsening intractable low back pain with weakness and radiculopathy of his left lower extremity.  He has chronic back pain followed by spine surgery.  There were plans in place to undergo surgery/decompression.  He underwent a disc aspiration infection has been ruled out, but afterwards he was deemed too high risk candidate for spine surgery.  Due to dry gangrene of the right transtibial amputation site, he underwent an AKA on 3/22 by [name].  He is overall stable, awaiting placement to SNF, and needs to ensure that he is able to tolerate dialysis in the chair as he is quite deconditioned currently ? ?Subjective / 24h Interval events: ?No complaints this morning.  Pain controlled.  Had dialysis yesterday and tolerated being in the chair. ? ?Assesement and Plan: ?Principal Problem: ?  Lumbar back pain with radiculopathy affecting left lower extremity ?Active Problems: ?  ESRD on dialysis [facility]) ?  PAF (paroxysmal atrial fibrillation) ([place]) ?  Essential hypertension, benign ?  Type 2 diabetes mellitus with chronic kidney disease, with long-term current use of insulin ([place]) ?  GERD without esophagitis ?  Gangrene of extremity due to atherosclerosis of artery ([place]) ?  Pacemaker infection ([place]) ?  Severe protein-calorie malnutrition ([place]) ?  Pressure injury of skin ?  Ulcer of right leg, limited to breakdown of skin ([place]) ?  Below-knee amputation of right lower extremity ([place]) ?  Endocarditis ?  Dehiscence of amputation stump of right lower extremity ([facility]) ?  Goals of care, counseling/discussion ?  Palliative care by  specialist ? ? ?Assessment and Plan: ?Principal problem ?L2-3 severe spinal stenosis, back pain-with left lower extremity radiculopathy.  CT showed moderate neuroforaminal stenosis at L2-L3, L5-S1.  An MRI showed subacute fractures at the L2-L3 disc and severe multifactorial spinal stenosis.  Neurosurgery consulted, he is deemed too high risk candidate for spinal surgery.  He completed a course of IV Decadron.  IR completed disc aspiration on 3/10 without evidence of an infection.  ID consulted and evaluated patient as well.  Pain reasonably well controlled with oral Dilaudid and Robaxin, appreciate palliative care input.  ? ?Active problems ?Peripheral vascular disease, dry gangrene of the right transtibial amputation site-poor healing, [name] consulted and patient is status post right AKA on 3/22. ? ?Chronic pacemaker infection, possible endocarditis on anterior mitral leaflet-seen by ID as an outpatient, currently he is on suppressive therapy with doxycycline and amoxicillin indefinitely.  Continue ? ?Left metatarsal wound-poor healing, AKA not possible due to arterial graft.  For now managed conservatively, he is on suppressive antibiotics at baseline ? ?ESRD-nephrology following, getting dialysis.  Tolerated in the chair yesterday ? ?Severe TR with flail tricuspid valve leaflet-noted, volume status managed by dialysis ? ?Chronic systolic CHF-most recent 2D echo done March 2023 shows EF of 30-35%, global hypokinesis of the LV.  RV was normal.  PA pressure was moderately elevated.  Currently appears euvolemic, volume management per HD ? ?History of sinus node dysfunction and complete heart block-status post pacemaker 2015.  Concern for pacemaker infection on suppressive antibiotics as above ? ?Coronary artery disease-with history of  PCI.  Seeing Dr. [name] ? ?Paroxysmal A-fib-continue Lopressor, Eliquis.  Cardiology saw him during this hospital stay ? ?Essential hypertension-controlled, continue to monitor blood  pressure ? ?Hyperlipidemia-continue statin ? ?Anemia of chronic renal disease-no bleeding, continue to monitor ? ?Disposition-extremely deconditioned, PT recommends SNF.  Placement pending ? ?Type 2 diabetes mellitus-continue insulin regimen as below.  Most recent A1c 6.8.  CBGs controlled ? ?CBG (last 3)  ?Recent Labs  ?  02/10/22 ?1801 02/10/22 ?2034 02/11/22 ?1740  ?GLUCAP 116* 116* 111*  ? ? ? ?Pressure Injury 01/14/22 Ischial tuberosity Right Stage 3 -  Full thickness tissue loss. Subcutaneous fat may be visible but bone, tendon or muscle are NOT exposed. (Active)  ?01/14/22 0321  ?Location: Ischial tuberosity  ?Location Orientation: Right  ?Staging: Stage 3 -  Full thickness tissue loss. Subcutaneous fat may be visible but bone, tendon or muscle are NOT exposed.  ?Wound Description (Comments):   ?Present on Admission: Yes  ?   ?Pressure Injury 01/14/22 Buttocks Right Stage 2 -  Partial thickness loss of dermis presenting as a shallow open injury with a red, pink wound bed without slough. (Active)  ?01/14/22 0322  ?Location: Buttocks  ?Location Orientation: Right  ?Staging: Stage 2 -  Partial thickness loss of dermis presenting as a shallow open injury with a red, pink wound bed without slough.  ?Wound Description (Comments):   ?Present on Admission: Yes  ?   ?Pressure Injury 01/14/22 Buttocks Posterior;Proximal;Right Stage 2 -  Partial thickness loss of dermis presenting as a shallow open injury with a red, pink wound bed without slough. (Active)  ?01/14/22 0324  ?Location: Buttocks  ?Location Orientation: Posterior;Proximal;Right  ?Staging: Stage 2 -  Partial thickness loss of dermis presenting as a shallow open injury with a red, pink wound bed without slough.  ?Wound Description (Comments):   ?Present on Admission: Yes  ? ? ?Scheduled Meds: ? (feeding supplement) PROSource Plus  30 mL Oral BID BM  ? amoxicillin  500 mg Oral Q24H  ? apixaban  5 mg Oral BID  ? vitamin C  1,000 mg Oral Daily  ? atorvastatin   20 mg Oral QHS  ? calcitRIOL  0.25 mcg Oral Daily  ? Chlorhexidine Gluconate Cloth  6 each Topical Q0600  ? darbepoetin (ARANESP) injection - DIALYSIS  100 mcg Intravenous Q Thu-HD  ? docusate sodium  100 mg Oral Daily  ? doxycycline  100 mg Oral Q12H  ? feeding supplement (NEPRO CARB STEADY)  237 mL Oral BID BM  ? furosemide  80 mg Oral Once per day on Sun Mon Wed Fri  ? gabapentin  200 mg Oral QHS  ? insulin aspart  0-6 Units Subcutaneous TID WC  ? insulin aspart protamine- aspart  10 Units Subcutaneous BID WC  ? lidocaine  1 patch Transdermal Q24H  ? metoprolol tartrate  25 mg Oral BID  ? multivitamin with minerals  1 tablet Oral Once per day on Mon Thu  ? nutrition supplement (JUVEN)  1 packet Oral BID BM  ? pantoprazole  40 mg Oral Daily  ? sodium chloride flush  3 mL Intravenous Q12H  ? zinc sulfate  220 mg Oral Daily  ? ?Continuous Infusions: ? sodium chloride    ? sodium chloride    ? magnesium sulfate bolus IVPB    ? ?PRN Meds:.sodium chloride, sodium chloride, bisacodyl, guaiFENesin-dextromethorphan, hydrALAZINE, HYDROmorphone (DILAUDID) injection, HYDROmorphone, labetalol, lidocaine (PF), lidocaine-prilocaine, magnesium sulfate bolus IVPB, methocarbamol, metoprolol tartrate, Muscle Rub, ondansetron **OR** ondansetron (ZOFRAN) IV,  ondansetron, pentafluoroprop-tetrafluoroeth, phenol, polyethylene glycol, sodium chloride, sodium chloride flush, traMADol, traZODone ? ?Diet Orders (From admission, onward)  ? ?  Start     Ordered  ? 01/31/22 1523  Diet renal with fluid restriction Fluid restriction: 1200 mL Fluid; Room service appropriate? Yes; Fluid consistency: Thin  Diet effective now       ?Question Answer Comment  ?Fluid restriction: 1200 mL Fluid   ?Room service appropriate? Yes   ?Fluid consistency: Thin   ?  ? 01/31/22 1522  ? ?  ?  ? ?  ? ? ?DVT prophylaxis: SCD's Start: 01/31/22 1523 ?apixaban (ELIQUIS) tablet 5 mg  ? ?Lab Results  ?Component Value Date  ? PLT 192 02/08/2022  ? ? ?  Code Status: Full  Code ? ?Family Communication: No family at bedside ? ?Status is: Inpatient ? ?Remains inpatient appropriate because: Pending disposition ? ?Level of care: Med-Surg ? ?Consultants:  ?Nephrology ?Palliative care ?ID ?C

## 2022-02-11 NOTE — Progress Notes (Signed)
?[facility] ?Progress Note  ? ?Subjective:   Tolerated dialysis in the chair yesterday. Denies SOB, CP, abdominal pain and nausea.  ? ?Objective ?Vitals:  ? 02/10/22 1730 02/10/22 1802 02/10/22 2018 02/11/22 0630  ?BP: 104/87 102/81 (!) 126/100 105/68  ?Pulse: 81 74 (!) 108 91  ?Resp: '18 18 16 18  '$ ?Temp: 98.1 ?F (36.7 ?C) 98 ?F (36.7 ?C) 97.8 ?F (36.6 ?C) 97.8 ?F (36.6 ?C)  ?TempSrc:   Oral Oral  ?SpO2: 94%  100%   ?Weight:      ?Height:      ? ?Physical Exam ?General: Alert and in NAD ?Heart: RRR, no murmurs, rubs or gallops ?Lungs: CTA bilatearlly without wheezing, rhonchi or rales ?Abdomen: Soft, non-distended, +BS ?Extremities: LLE wrapped, R AKA. No edema b/l lower extremities ?Dialysis Access: RUE AVF + T/b ? ?Additional Objective ?Labs: ?Basic Metabolic Panel: ?Recent Labs  ?Lab 02/05/22 ?0842 02/06/22 ?6063 02/08/22 ?0801  ?NA 135 136 134*  ?K 3.6 3.6 4.0  ?CL 96* 98 97*  ?CO2 '27 25 26  '$ ?GLUCOSE 138* 141* 165*  ?BUN 35* 41* 42*  ?CREATININE 3.40* 3.58* 3.22*  ?CALCIUM 8.5* 8.6* 8.6*  ?PHOS  --  5.5* 4.3  ? ?Liver Function Tests: ?Recent Labs  ?Lab 02/06/22 ?0160 02/08/22 ?0801  ?ALBUMIN 2.1* 2.2*  ? ?No results for input(s): LIPASE, AMYLASE in the last 168 hours. ?CBC: ?Recent Labs  ?Lab 02/05/22 ?0842 02/06/22 ?1093 02/08/22 ?0802  ?WBC 10.4 10.5 10.5  ?HGB 10.3* 10.3* 10.1*  ?HCT 32.9* 33.3* 32.5*  ?MCV 95.9 95.1 95.0  ?PLT 182 204 192  ? ?Blood Culture ?   ?Component Value Date/Time  ? SDES WOUND 01/19/2022 1452  ? SPECREQUEST L3/L4 [place] ASPIRATION 01/19/2022 1452  ? CULT  01/19/2022 1452  ?  No growth aerobically or anaerobically. ?Performed at [facility], [place] [address] [place], [place] [address] ?  ? REPTSTATUS 01/24/2022 FINAL 01/19/2022 1452  ? ? ?Cardiac Enzymes: ?No results for input(s): CKTOTAL, CKMB, CKMBINDEX, TROPONINI in the last 168 hours. ?CBG: ?Recent Labs  ?Lab 02/10/22 ?3220 02/10/22 ?1121 02/10/22 ?1801 02/10/22 ?2034 02/11/22 ?2542  ?GLUCAP 112* 137* 116* 116*  111*  ? ?Iron Studies: No results for input(s): IRON, TIBC, TRANSFERRIN, FERRITIN in the last 72 hours. ?'@lablastinr3'$ @ ?Studies/Results: ?No results found. ?Medications: ? sodium chloride    ? sodium chloride    ? magnesium sulfate bolus IVPB    ? ? (feeding supplement) PROSource Plus  30 mL Oral BID BM  ? amoxicillin  500 mg Oral Q24H  ? apixaban  5 mg Oral BID  ? vitamin C  1,000 mg Oral Daily  ? atorvastatin  20 mg Oral QHS  ? calcitRIOL  0.25 mcg Oral Daily  ? Chlorhexidine Gluconate Cloth  6 each Topical Q0600  ? darbepoetin (ARANESP) injection - DIALYSIS  100 mcg Intravenous Q Thu-HD  ? docusate sodium  100 mg Oral Daily  ? doxycycline  100 mg Oral Q12H  ? feeding supplement (NEPRO CARB STEADY)  237 mL Oral BID BM  ? furosemide  80 mg Oral Once per day on Sun Mon Wed Fri  ? gabapentin  200 mg Oral QHS  ? insulin aspart  0-6 Units Subcutaneous TID WC  ? insulin aspart protamine- aspart  10 Units Subcutaneous BID WC  ? lidocaine  1 patch Transdermal Q24H  ? metoprolol tartrate  25 mg Oral BID  ? multivitamin with minerals  1 tablet Oral Once per day on Mon Thu  ?  nutrition supplement (JUVEN)  1 packet Oral BID BM  ? pantoprazole  40 mg Oral Daily  ? sodium chloride flush  3 mL Intravenous Q12H  ? zinc sulfate  220 mg Oral Daily  ? ? ?Dialysis Orders: ?TTS - GOC ?4hrs21mn, BFR 400, DFR 800,  EDW 94kg, 3K/ 2.5Ca ?-No Heparin ?-Mircera 200 mcg IV q 2wks - last 01/11/22 ?-Calcitriol 0.231m PO TIW ? ?Assessment/Plan: ?1 Lumbar back pain/Radiculopathy: Followed by Spine Surgery Dr. CrSaintclair HalstedMR Lumbar Spine completed: showed fractures L2-L3 with fluid-? Trauma vs. Infection. Neurosurgeon canceled surgery secondary to high risk. L2-3 disc aspiration 01/19/2022 culture negative.Continue pain management and Pt/OT per primary ?2. PVD with Dry gangrene:R AKA on Wednesday 01/31/2022 per Dr. DuSharol Given ?3. ESRD - on HD TTS. Tolerated dialysis in the chair yesterday.  ?4. Hypertension/volume  -BP & Volume are stable.Lower EDW on  discharge. UF as tolerated.  ?5. Anemia of CKD - Hgb 10.1 02/08/2022. Given Aranesp 100 mcg IV 02/01/2022. Follow HGB. Transfuse PRN.  ? 6. Secondary Hyperparathyroidism - PO4 and calcium at goal. Continue VDRA. ?7. Nutrition - Renal diet on fluid restriction. Albumin is low. On protein supps, renal vitamins  ?8. DMT2-per primary ?9. 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 On ABX. Follows with cardiology.  ? ?SaAnice PaganiniPA-C ?02/11/2022, 8:04 AM  ?[facility] ?Pager: [phone] ? ?

## 2022-02-12 DIAGNOSIS — M5416 Radiculopathy, lumbar region: Secondary | ICD-10-CM | POA: Diagnosis not present

## 2022-02-12 LAB — GLUCOSE, CAPILLARY
Glucose-Capillary: 105 mg/dL — ABNORMAL HIGH (ref 70–99)
Glucose-Capillary: 100 mg/dL — ABNORMAL HIGH (ref 70–99)
Glucose-Capillary: 150 mg/dL — ABNORMAL HIGH (ref 70–99)
Glucose-Capillary: 116 mg/dL — ABNORMAL HIGH (ref 70–99)

## 2022-02-12 MED ORDER — GABAPENTIN 100 MG PO CAPS
200.0000 mg | ORAL_CAPSULE | Freq: Every day | ORAL | Status: DC
Start: 2022-02-12 — End: 2022-05-15

## 2022-02-12 MED ORDER — ALTEPLASE 2 MG IJ SOLR: 2.0000 mg | Freq: Once | INTRAMUSCULAR | Status: DC | PRN

## 2022-02-12 MED ORDER — HEPARIN SODIUM (PORCINE) 1000 UNIT/ML DIALYSIS: 1000.0000 [IU] | INTRAMUSCULAR | Status: DC | PRN

## 2022-02-12 MED ORDER — HYDROMORPHONE HCL 2 MG PO TABS: 2.0000 mg | ORAL_TABLET | ORAL | 0 refills | Status: DC | PRN

## 2022-02-12 MED ORDER — SODIUM CHLORIDE 0.9 % IV SOLN: 100.0000 mL | INTRAVENOUS | Status: DC | PRN

## 2022-02-12 MED ORDER — METHOCARBAMOL 500 MG PO TABS: 500.0000 mg | ORAL_TABLET | Freq: Three times a day (TID) | ORAL | Status: DC | PRN

## 2022-02-12 NOTE — Progress Notes (Signed)
?[facility] ?Progress Note  ? ?Subjective:    ?Seen and examined patient at bedside. No complaints. Plan for HD 02/13/22.  ? ?Objective ?Vitals:  ? 02/11/22 2015 02/11/22 2249 02/12/22 0502 02/12/22 2774  ?BP: (!) 92/51 105/66 (!) 96/42 106/71  ?Pulse: 100 (!) 105 88 (!) 54  ?Resp: '18 18 18 16  '$ ?Temp: 98.3 ?F (36.8 ?C) 98.1 ?F (36.7 ?C) 98 ?F (36.7 ?C) (!) 97.4 ?F (36.3 ?C)  ?TempSrc: Oral Oral Oral Oral  ?SpO2: 100% 97% 98% 91%  ?Weight:      ?Height:      ? ?Physical Exam ?General: Awake and alert; NAD ?Heart: Normal S1 and S2; No murmurs, gallops, or rubs ?Lungs: Clear throughout; No wheezing, rales, or rhonchi ?Abdomen: Soft and non-tender ?Extremities: No edema R thigh or LLE; R BKA-wound vac in place; LLE wrapped ?Dialysis Access: RUE AVF (+) B/T  ? ?Filed Weights  ? 02/08/22 0740 02/08/22 1155  ?Weight: 83.2 kg 80.7 kg  ? ? ?Intake/Output Summary (Last 24 hours) at 02/12/2022 0941 ?Last data filed at 02/12/2022 0900 ?Gross per 24 hour  ?Intake 1300 ml  ?Output 0 ml  ?Net 1300 ml  ? ? ?Additional Objective ?Labs: ?Basic Metabolic Panel: ?Recent Labs  ?Lab 02/06/22 ?1287 02/08/22 ?0801  ?NA 136 134*  ?K 3.6 4.0  ?CL 98 97*  ?CO2 25 26  ?GLUCOSE 141* 165*  ?BUN 41* 42*  ?CREATININE 3.58* 3.22*  ?CALCIUM 8.6* 8.6*  ?PHOS 5.5* 4.3  ? ?Liver Function Tests: ?Recent Labs  ?Lab 02/06/22 ?8676 02/08/22 ?0801  ?ALBUMIN 2.1* 2.2*  ? ?No results for input(s): LIPASE, AMYLASE in the last 168 hours. ?CBC: ?Recent Labs  ?Lab 02/06/22 ?7209 02/08/22 ?0802  ?WBC 10.5 10.5  ?HGB 10.3* 10.1*  ?HCT 33.3* 32.5*  ?MCV 95.1 95.0  ?PLT 204 192  ? ?Blood Culture ?   ?Component Value Date/Time  ? SDES WOUND 01/19/2022 1452  ? SPECREQUEST L3/L4 [place] ASPIRATION 01/19/2022 1452  ? CULT  01/19/2022 1452  ?  No growth aerobically or anaerobically. ?Performed at [facility], [place] [address] [place], [place] [address] ?  ? REPTSTATUS 01/24/2022 FINAL 01/19/2022 1452  ? ? ?Cardiac Enzymes: ?No results for input(s): CKTOTAL,  CKMB, CKMBINDEX, TROPONINI in the last 168 hours. ?CBG: ?Recent Labs  ?Lab 02/11/22 ?0627 02/11/22 ?1122 02/11/22 ?1651 02/11/22 ?2044 02/12/22 ?0724  ?GLUCAP 111* 107* 171* 99 105*  ? ?Iron Studies: No results for input(s): IRON, TIBC, TRANSFERRIN, FERRITIN in the last 72 hours. ?Lab Results  ?Component Value Date  ? INR 1.4 (H) 01/18/2022  ? INR 1.9 (H) 01/17/2022  ? INR 3.0 (H) 10/30/2021  ? ?Studies/Results: ?No results found. ? ?Medications: ? sodium chloride    ? sodium chloride    ? magnesium sulfate bolus IVPB    ? ? (feeding supplement) PROSource Plus  30 mL Oral BID BM  ? amoxicillin  500 mg Oral Q24H  ? apixaban  5 mg Oral BID  ? vitamin C  1,000 mg Oral Daily  ? atorvastatin  20 mg Oral QHS  ? calcitRIOL  0.25 mcg Oral Daily  ? Chlorhexidine Gluconate Cloth  6 each Topical Q0600  ? darbepoetin (ARANESP) injection - DIALYSIS  100 mcg Intravenous Q Thu-HD  ? docusate sodium  100 mg Oral Daily  ? doxycycline  100 mg Oral Q12H  ? feeding supplement (NEPRO CARB STEADY)  237 mL Oral BID BM  ? furosemide  80 mg Oral Once per day on  Sun Mon Wed Fri  ? gabapentin  200 mg Oral QHS  ? insulin aspart  0-6 Units Subcutaneous TID WC  ? insulin aspart protamine- aspart  10 Units Subcutaneous BID WC  ? lidocaine  1 patch Transdermal Q24H  ? metoprolol tartrate  25 mg Oral BID  ? multivitamin with minerals  1 tablet Oral Once per day on Mon Thu  ? nutrition supplement (JUVEN)  1 packet Oral BID BM  ? pantoprazole  40 mg Oral Daily  ? sodium chloride flush  3 mL Intravenous Q12H  ? zinc sulfate  220 mg Oral Daily  ? ? ?Dialysis Orders: ?TTS - GOC ?4hrs46mn, BFR 400, DFR 800,  EDW 94kg, 3K/ 2.5Ca ?-No Heparin ?-Mircera 200 mcg IV q 2wks - last 01/11/22 ?-Calcitriol 0.253m PO TIW ? ?Assessment/Plan: ?1 Lumbar back pain/Radiculopathy: Followed by Spine Surgery Dr. CrSaintclair HalstedMR Lumbar Spine completed: showed fractures L2-L3 with fluid-? Trauma vs. Infection. Neurosurgeon canceled surgery secondary to high risk. L2-3 disc  aspiration 01/19/2022 culture negative.Continue pain management and Pt/OT per primary ?2. PVD with Dry gangrene:R AKA on Wednesday 01/31/2022 per Dr. DuSharol Given ?3. ESRD - on HD TTS. Tolerated dialysis in the chair previously. Plan for HD 02/13/22. ?4. Hypertension/volume  -BP & Volume are relatively stable. Lower EDW on discharge. UF as tolerated.  ?5. Anemia of CKD - Hgb 10.1 02/08/2022. Given Aranesp 100 mcg IV 02/01/2022. Follow HGB. Transfuse PRN.  ? 6. Secondary Hyperparathyroidism - PO4 and calcium at goal. Continue VDRA. ?7. Nutrition - Renal diet on fluid restriction. Albumin is low. On protein supps, renal vitamins  ?8. DMT2-per primary ?9. 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 On ABX. Follows with cardiology.  ? ?CoTobie PoetNP ?[facility] ?02/12/2022,9:41 AM ? LOS: 30 days  ?  ?

## 2022-02-12 NOTE — Discharge Summary (Signed)
? ?Physician Discharge Summary  ?Alexander [name] NKN:[id number] DOB: 1961/01/05 DOA: 01/13/2022 ? ?PCP: [name], MD ? ?Admit date: 01/13/2022 ?Discharge date: 02/13/2022 ? ?Admitted From: home ?Disposition:  SNF ? ?Recommendations for Outpatient Follow-up:  ?Follow up with PCP in 1-2 weeks ? ?Home Health: none ?Equipment/Devices: none ? ?Discharge Condition: stable ?CODE STATUS: Full code ?Diet recommendation: renal ? ?HPI: Per admitting MD, ?Alexander [name] is a 61 y.o. Caucasian male with medical history significant for end-stage renal disease on HD on TTS, hypertension, dyslipidemia, peripheral vascular disease, status post right BKA and left midfoot amputation, atrial fibrillation on Eliquis, coronary artery disease, type 2 diabetes mellitus, and status post pacemaker, presented to the ER with onset of intractable low back pain over the last couple of days with associated left lower extremity radiation and radiculopathy with associated spasm and weakness.  No fever or chills.  No nausea or vomiting or abdominal pain.  No chest pain or palpitations.  No cough or wheezing or dyspnea.  He denies any urinary or stool incontinence.  The patient just had his ast hemodialysis session today.  He has been having chronic low back pain with occasional radiation to both lower extremities.  He was scheduled for LS spine MRI on 02/07/2022.  He was noted to have significant edema with weeping in both lower extremities with recent Ortho follow-up status post his amputation.  This was managed with compression wraps and close follow-up. ? ?Hospital Course / Discharge diagnoses: ?Principal Problem: ?  Lumbar back pain with radiculopathy affecting left lower extremity ?Active Problems: ?  ESRD on dialysis [facility]) ?  PAF (paroxysmal atrial fibrillation) ([place]) ?  Essential hypertension, benign ?  Type 2 diabetes mellitus with chronic kidney disease, with long-term current use of insulin ([place]) ?  GERD without esophagitis ?  Gangrene of  extremity due to atherosclerosis of artery ([place]) ?  Pacemaker infection ([place]) ?  Severe protein-calorie malnutrition ([place]) ?  Pressure injury of skin ?  Ulcer of right leg, limited to breakdown of skin ([place]) ?  Below-knee amputation of right lower extremity ([place]) ?  Endocarditis ?  Dehiscence of amputation stump of right lower extremity ([facility]) ?  Goals of care, counseling/discussion ?  Palliative care by specialist ? ? ?Assessment and Plan: ?Principal problem ?L2-3 severe spinal stenosis, back pain-with left lower extremity radiculopathy.  CT showed moderate neuroforaminal stenosis at L2-L3, L5-S1.  An MRI showed subacute fractures at the L2-L3 disc and severe multifactorial spinal stenosis.  Neurosurgery consulted, he is deemed too high risk candidate for spinal surgery.  He completed a course of IV Decadron.  IR completed disc aspiration on 3/10 without evidence of an infection.  ID consulted and evaluated patient as well.  Pain reasonably well controlled with oral Dilaudid and Robaxin ? ?Active problems ?Peripheral vascular disease, dry gangrene of the right transtibial amputation site-poor healing, [name] consulted and patient is status post right AKA on 3/22. ?Chronic pacemaker infection, possible endocarditis on anterior mitral leaflet-seen by ID as an outpatient, currently he is on suppressive therapy with doxycycline and amoxicillin indefinitely.  ?Left metatarsal wound-poor healing, AKA not possible due to arterial graft.  For now managed conservatively, he is on suppressive antibiotics at baseline ?ESRD-continue HD as an outpatient  ?Severe TR with flail tricuspid valve leaflet-noted, volume status managed by dialysis ?Chronic systolic CHF-most recent 2D echo done March 2023 shows EF of 30-35%, global hypokinesis of the LV.  RV was normal.  PA pressure was moderately elevated.  Currently appears euvolemic, volume management per HD ?History of sinus node dysfunction and complete heart block-status post  pacemaker 2015.  Concern for pacemaker infection on suppressive antibiotics as above ?Coronary artery disease-with history of PCI.  Seeing Dr. [name] ?Paroxysmal A-fib-continue Lopressor, Eliquis.  Cardiology saw him during this hospital stay ?Essential hypertension-controlled, continue to monitor blood pressure ?Hyperlipidemia-continue statin ?Anemia of chronic renal disease-no bleeding, continue to monitor ?Disposition-extremely deconditioned, PT recommends SNF.   ?Type 2 diabetes mellitus-continue insulin regimen as below.  Most recent A1c 6.8.  CBGs controlled ? ?Pressure Injury 01/14/22 Ischial tuberosity Right Stage 3 -  Full thickness tissue loss. Subcutaneous fat may be visible but bone, tendon or muscle are NOT exposed. (Active)  ?01/14/22 0321  ?Location: Ischial tuberosity  ?Location Orientation: Right  ?Staging: Stage 3 -  Full thickness tissue loss. Subcutaneous fat may be visible but bone, tendon or muscle are NOT exposed.  ?Wound Description (Comments):   ?Present on Admission: Yes  ?   ?Pressure Injury 01/14/22 Buttocks Right Stage 2 -  Partial thickness loss of dermis presenting as a shallow open injury with a red, pink wound bed without slough. (Active)  ?01/14/22 0322  ?Location: Buttocks  ?Location Orientation: Right  ?Staging: Stage 2 -  Partial thickness loss of dermis presenting as a shallow open injury with a red, pink wound bed without slough.  ?Wound Description (Comments):   ?Present on Admission: Yes  ?   ?Pressure Injury 01/14/22 Buttocks Posterior;Proximal;Right Stage 2 -  Partial thickness loss of dermis presenting as a shallow open injury with a red, pink wound bed without slough. (Active)  ?01/14/22 0324  ?Location: Buttocks  ?Location Orientation: Posterior;Proximal;Right  ?Staging: Stage 2 -  Partial thickness loss of dermis presenting as a shallow open injury with a red, pink wound bed without slough.  ?Wound Description (Comments):   ?Present on Admission: Yes  ? ? ?Sepsis ruled  out ? ? ?Discharge Instructions ? ? ?Allergies as of 02/13/2022   ? ?   Reactions  ? Cytoxan [cyclophosphamide] Other (See Comments)  ? Pancytopenia  ? Pletal [cilostazol] Palpitations, Other (See Comments)  ? "Can hear heart beating loudly".  ? Keflex [cephalexin] Nausea And Vomiting  ? Tylenol [acetaminophen] Other (See Comments)  ? Patient has a cold sweat  ? ?  ? ?  ?Medication List  ?  ? ?STOP taking these medications   ? ?ibuprofen 800 MG tablet ?Commonly known as: ADVIL ?  ?methylPREDNISolone 4 MG Tbpk tablet ?Commonly known as: MEDROL DOSEPAK ?  ?oxyCODONE-acetaminophen 5-325 MG tablet ?Commonly known as: PERCOCET/ROXICET ?  ? ?  ? ?TAKE these medications   ? ?Accu-Chek Advantage Diabetes kit ?Use as instructed ?  ?Accu-Chek FastClix Lancets Misc ?Check blood sugar TID & QHS ?  ?amoxicillin 500 MG capsule ?Commonly known as: AMOXIL ?Take 1 capsule (500 mg total) by mouth at bedtime. ?  ?apixaban 5 MG Tabs tablet ?Commonly known as: Eliquis ?Take 1 tablet (5 mg total) by mouth 2 (two) times daily. ?  ?atorvastatin 20 MG tablet ?Commonly known as: LIPITOR ?Take 1 tablet (20 mg total) by mouth at bedtime. ?  ?blood glucose meter kit and supplies Kit ?Check blood sugar TID & QHS ?  ?doxycycline 100 MG tablet ?Commonly known as: VIBRA-TABS ?Take 1 tablet (100 mg total) by mouth 2 (two) times daily. ?  ?furosemide 80 MG tablet ?Commonly known as: LASIX ?Take 80 mg by mouth See admin instructions. Take 80 mg by mouth on (non-dialysis days) Mondays,  Wednesdays, Fridays & Sundays in the morning as needed "to cause urination" ?  ?gabapentin 100 MG capsule ?Commonly known as: NEURONTIN ?Take 2 capsules (200 mg total) by mouth at bedtime. ?  ?glucose blood test strip ?Use as instructed ?  ?glucose blood test strip ?Commonly known as: Accu-Chek Aviva Plus ?Use as instructed ?  ?glucose blood test strip ?Commonly known as: Choice DM Fora G20 Test Strips ?Check blood sugar TID & QHS ?  ?HYDROmorphone 2 MG tablet ?Commonly  known as: DILAUDID ?Take 1 tablet (2 mg total) by mouth every 2 (two) hours as needed for severe pain. ?  ?lidocaine-prilocaine cream ?Commonly known as: EMLA ?Apply 1 application topically every dialysis (o

## 2022-02-12 NOTE — Progress Notes (Signed)
Physical Therapy Treatment ?Patient Details ?Name: Alexander [name] ?MRN: [id number] ?DOB: September 15, 1961 ?Today's Date: 02/12/2022 ? ? ?History of Present Illness The pt is a 61 yo male presenting 3/4 with chronic back pain and weakness to LLE in addition to frequent falls (x2 on day of admission). Pt found to have severe L2-L3 stenosis but surgery was too high risk. Now s/p R AKA on 3/22. PMH: ESRD on HD TTS, paroxysmal atrial fibrillation on Eliquis, pacemaker, CAD s/p stent, diastolic CHF, PVD with right BKA and left midfoot amputation, history of GI bleed, and DM II. ? ?  ?PT Comments  ? ? Pt making slow, but steady progress. Improving sitting and ability to assist with transfers.    ?Recommendations for follow up therapy are one component of a multi-disciplinary discharge planning process, led by the attending physician.  Recommendations may be updated based on patient status, additional functional criteria and insurance authorization. ? ?Follow Up Recommendations ? Skilled nursing-short term rehab (<3 hours/day) ?  ?  ?Assistance Recommended at Discharge Frequent or constant Supervision/Assistance  ?Patient can return home with the following Two people to help with walking and/or transfers;Help with stairs or ramp for entrance ?  ?Equipment Recommendations ? Hospital bed;Other (comment) (hoyer lift)  ?  ?Recommendations for Other Services   ? ? ?  ?Precautions / Restrictions Precautions ?Precautions: Fall;Other (comment) ?Precaution Comments: new Rt AKA, hx of left midfoot amputation,  also with new lumbar stenosis  ?  ? ?Mobility ? Bed Mobility ?Overal bed mobility: Needs Assistance ?Bed Mobility: Rolling, Sidelying to Sit, Sit to Sidelying ?Rolling: Min guard ?Sidelying to sit: Mod assist, +2 for safety/equipment ?  ?  ?Sit to sidelying: Mod assist, +2 for safety/equipment ?General bed mobility comments: Assist to elevate trunk into sitting and to bring LLE back up into bed returning to sidelying. Assist for safety  with rolling ?  ? ?Transfers ?Overall transfer level: Needs assistance ?Equipment used: Sliding board ?  ?  ?  ?  ?  ?  ? Lateral/Scoot Transfers: Mod assist, With slide board, +2 safety/equipment, Max assist ?General transfer comment: Worked on lateral scoot using sliding board along EOB. Used bed pad under hips to assist with scooting. Pt using UE's to assist with moving LLE incremently as he scooted up the bed ?  ? ?Ambulation/Gait ?  ?  ?  ?  ?  ?  ?  ?  ? ? ?Stairs ?  ?  ?  ?  ?  ? ? ?Wheelchair Mobility ?  ? ?Modified Rankin (Stroke Patients Only) ?  ? ? ?  ?Balance Overall balance assessment: Needs assistance ?Sitting-balance support: Feet supported, Bilateral upper extremity supported ?Sitting balance-Leahy Scale: Fair ?Sitting balance - Comments: Able to sit without UE support on EOB. Prefers UE support ?  ?  ?  ?  ?  ?  ?  ?  ?  ?  ?  ?  ?  ?  ?  ?  ? ?  ?Cognition Arousal/Alertness: Awake/alert ?Behavior During Therapy: [facility] for tasks assessed/performed ?Overall Cognitive Status: Within Functional Limits for tasks assessed ?  ?  ?  ?  ?  ?  ?  ?  ?  ?  ?  ?  ?  ?  ?  ?  ?  ?  ?  ? ?  ?Exercises   ? ?  ?General Comments   ?  ?  ? ?Pertinent Vitals/Pain Pain Assessment ?Pain Assessment: Faces ?Faces Pain Scale: Hurts even  more ?Pain Location: back ?Pain Descriptors / Indicators: Grimacing, Guarding ?Pain Intervention(s): Monitored during session  ? ? ?Home Living   ?  ?  ?  ?  ?  ?  ?  ?  ?  ?   ?  ?Prior Function    ?  ?  ?   ? ?PT Goals (current goals can now be found in the care plan section) Acute Rehab PT Goals ?Patient Stated Goal: to be able to get stronger and go home ?Progress towards PT goals: Progressing toward goals ? ?  ?Frequency ? ? ? Min 2X/week ? ? ? ?  ?PT Plan Current plan remains appropriate;Frequency needs to be updated  ? ? ?Co-evaluation   ?  ?  ?  ?  ? ?  ?AM-PAC PT "6 Clicks" Mobility   ?Outcome Measure ? Help needed turning from your back to your side while in a flat bed without  using bedrails?: A Little ?Help needed moving from lying on your back to sitting on the side of a flat bed without using bedrails?: A Lot ?Help needed moving to and from a bed to a chair (including a wheelchair)?: Total ?Help needed standing up from a chair using your arms (e.g., wheelchair or bedside chair)?: Total ?Help needed to walk in hospital room?: Total ?Help needed climbing 3-5 steps with a railing? : Total ?6 Click Score: 9 ? ?  ?End of Session   ?Activity Tolerance: Patient tolerated treatment well ?Patient left: with call bell/phone within reach;in bed;with bed alarm set;with family/visitor present ?Nurse Communication: Mobility status ?PT Visit Diagnosis: Pain;Unsteadiness on feet (R26.81);Repeated falls (R29.6) ?Pain - part of body:  (back) ?  ? ? ?Time: 4193-7902 ?PT Time Calculation (min) (ACUTE ONLY): 17 min ? ?Charges:  $Therapeutic Activity: 8-22 mins          ?          ? ?[facility] PT ?Acute Rehabilitation Services ?Pager [phone] ?Office [phone] ? ? ? ?Shary Decamp [facility] ?02/12/2022, 4:20 PM ? ?

## 2022-02-12 NOTE — Progress Notes (Signed)
?PROGRESS NOTE ? ?Alexander [name] ZOX:[id number] DOB: 1961-06-28 DOA: 01/13/2022 ?PCP: [name], MD ? ? LOS: 30 days  ? ?Brief Narrative / Interim history: ?Alexander [name] is a 61 y.o. male with medical history significant for ESRD on HD on TTS, HTN, HLD, PVD status post right BKA and left midfoot amputation, Afib on Eliquis, CAD, DM2, and status post pacemaker, presented to the ER with worsening intractable low back pain with weakness and radiculopathy of his left lower extremity.  He has chronic back pain followed by spine surgery.  There were plans in place to undergo surgery/decompression.  He underwent a disc aspiration infection has been ruled out, but afterwards he was deemed too high risk candidate for spine surgery.  Due to dry gangrene of the right transtibial amputation site, he underwent an AKA on 3/22 by [name].  He is overall stable, awaiting placement to SNF, and needs to ensure that he is able to tolerate dialysis in the chair as he is quite deconditioned currently ? ?Subjective / 24h Interval events: ?Awaiting rehab. No complaints ? ?Assesement and Plan: ?Principal Problem: ?  Lumbar back pain with radiculopathy affecting left lower extremity ?Active Problems: ?  ESRD on dialysis [facility]) ?  PAF (paroxysmal atrial fibrillation) ([place]) ?  Essential hypertension, benign ?  Type 2 diabetes mellitus with chronic kidney disease, with long-term current use of insulin ([place]) ?  GERD without esophagitis ?  Gangrene of extremity due to atherosclerosis of artery ([place]) ?  Pacemaker infection ([place]) ?  Severe protein-calorie malnutrition ([place]) ?  Pressure injury of skin ?  Ulcer of right leg, limited to breakdown of skin ([place]) ?  Below-knee amputation of right lower extremity ([place]) ?  Endocarditis ?  Dehiscence of amputation stump of right lower extremity (HCC) ?  Goals of care, counseling/discussion ?  Palliative care by specialist ? ? ?Assessment and Plan: ?Principal problem ?L2-3 severe spinal stenosis, back  pain-with left lower extremity radiculopathy.  CT showed moderate neuroforaminal stenosis at L2-L3, L5-S1.  An MRI showed subacute fractures at the L2-L3 disc and severe multifactorial spinal stenosis.  Neurosurgery consulted, he is deemed too high risk candidate for spinal surgery.  He completed a course of IV Decadron.  IR completed disc aspiration on 3/10 without evidence of an infection.  ID consulted and evaluated patient as well.  Pain reasonably well controlled with oral Dilaudid and Robaxin, appreciate palliative care input.  ? ?Active problems ?Peripheral vascular disease, dry gangrene of the right transtibial amputation site-poor healing, [name] consulted and patient is status post right AKA on 3/22. ? ?Chronic pacemaker infection, possible endocarditis on anterior mitral leaflet-seen by ID as an outpatient, currently he is on suppressive therapy with doxycycline and amoxicillin indefinitely.  Continue ? ?Left metatarsal wound-poor healing, AKA not possible due to arterial graft.  For now managed conservatively, he is on suppressive antibiotics at baseline ? ?ESRD-nephrology following, getting dialysis.  Tolerated in the chair yesterday ? ?Severe TR with flail tricuspid valve leaflet-noted, volume status managed by dialysis ? ?Chronic systolic CHF-most recent 2D echo done March 2023 shows EF of 30-35%, global hypokinesis of the LV.  RV was normal.  PA pressure was moderately elevated.  Currently appears euvolemic, volume management per HD ? ?History of sinus node dysfunction and complete heart block-status post pacemaker 2015.  Concern for pacemaker infection on suppressive antibiotics as above ? ?Coronary artery disease-with history of PCI.  Seeing Dr. [name] ? ?Paroxysmal A-fib-continue Lopressor, Eliquis.  Cardiology saw  him during this hospital stay ? ?Essential hypertension-controlled, continue to monitor blood pressure ? ?Hyperlipidemia-continue statin ? ?Anemia of chronic renal disease-no bleeding,  continue to monitor ? ?Disposition-extremely deconditioned, PT recommends SNF.  Placement pending ? ?Type 2 diabetes mellitus-continue insulin regimen as below.  Most recent A1c 6.8.  CBGs controlled ? ?CBG (last 3)  ?Recent Labs  ?  02/11/22 ?2044 02/12/22 ?0724 02/12/22 ?1127  ?GLUCAP 99 105* 100*  ? ? ? ?Pressure Injury 01/14/22 Ischial tuberosity Right Stage 3 -  Full thickness tissue loss. Subcutaneous fat may be visible but bone, tendon or muscle are NOT exposed. (Active)  ?01/14/22 0321  ?Location: Ischial tuberosity  ?Location Orientation: Right  ?Staging: Stage 3 -  Full thickness tissue loss. Subcutaneous fat may be visible but bone, tendon or muscle are NOT exposed.  ?Wound Description (Comments):   ?Present on Admission: Yes  ?   ?Pressure Injury 01/14/22 Buttocks Right Stage 2 -  Partial thickness loss of dermis presenting as a shallow open injury with a red, pink wound bed without slough. (Active)  ?01/14/22 0322  ?Location: Buttocks  ?Location Orientation: Right  ?Staging: Stage 2 -  Partial thickness loss of dermis presenting as a shallow open injury with a red, pink wound bed without slough.  ?Wound Description (Comments):   ?Present on Admission: Yes  ?   ?Pressure Injury 01/14/22 Buttocks Posterior;Proximal;Right Stage 2 -  Partial thickness loss of dermis presenting as a shallow open injury with a red, pink wound bed without slough. (Active)  ?01/14/22 0324  ?Location: Buttocks  ?Location Orientation: Posterior;Proximal;Right  ?Staging: Stage 2 -  Partial thickness loss of dermis presenting as a shallow open injury with a red, pink wound bed without slough.  ?Wound Description (Comments):   ?Present on Admission: Yes  ? ? ?Scheduled Meds: ? (feeding supplement) PROSource Plus  30 mL Oral BID BM  ? amoxicillin  500 mg Oral Q24H  ? apixaban  5 mg Oral BID  ? vitamin C  1,000 mg Oral Daily  ? atorvastatin  20 mg Oral QHS  ? calcitRIOL  0.25 mcg Oral Daily  ? Chlorhexidine Gluconate Cloth  6 each  Topical Q0600  ? darbepoetin (ARANESP) injection - DIALYSIS  100 mcg Intravenous Q Thu-HD  ? docusate sodium  100 mg Oral Daily  ? doxycycline  100 mg Oral Q12H  ? feeding supplement (NEPRO CARB STEADY)  237 mL Oral BID BM  ? furosemide  80 mg Oral Once per day on Sun Mon Wed Fri  ? gabapentin  200 mg Oral QHS  ? insulin aspart  0-6 Units Subcutaneous TID WC  ? insulin aspart protamine- aspart  10 Units Subcutaneous BID WC  ? lidocaine  1 patch Transdermal Q24H  ? metoprolol tartrate  25 mg Oral BID  ? multivitamin with minerals  1 tablet Oral Once per day on Mon Thu  ? nutrition supplement (JUVEN)  1 packet Oral BID BM  ? pantoprazole  40 mg Oral Daily  ? sodium chloride flush  3 mL Intravenous Q12H  ? zinc sulfate  220 mg Oral Daily  ? ?Continuous Infusions: ? sodium chloride    ? sodium chloride    ? magnesium sulfate bolus IVPB    ? ?PRN Meds:.sodium chloride, sodium chloride, bisacodyl, guaiFENesin-dextromethorphan, hydrALAZINE, HYDROmorphone (DILAUDID) injection, HYDROmorphone, labetalol, lidocaine (PF), lidocaine-prilocaine, magnesium sulfate bolus IVPB, methocarbamol, metoprolol tartrate, Muscle Rub, ondansetron **OR** ondansetron (ZOFRAN) IV, ondansetron, pentafluoroprop-tetrafluoroeth, phenol, polyethylene glycol, sodium chloride, sodium chloride flush, traMADol, traZODone ? ?  Diet Orders (From admission, onward)  ? ?  Start     Ordered  ? 01/31/22 1523  Diet renal with fluid restriction Fluid restriction: 1200 mL Fluid; Room service appropriate? Yes; Fluid consistency: Thin  Diet effective now       ?Question Answer Comment  ?Fluid restriction: 1200 mL Fluid   ?Room service appropriate? Yes   ?Fluid consistency: Thin   ?  ? 01/31/22 1522  ? ?  ?  ? ?  ? ? ?DVT prophylaxis: SCD's Start: 01/31/22 1523 ?apixaban (ELIQUIS) tablet 5 mg  ? ?Lab Results  ?Component Value Date  ? PLT 192 02/08/2022  ? ? ?  Code Status: Full Code ? ?Family Communication: No family at bedside ? ?Status is: Inpatient ? ?Remains  inpatient appropriate because: Pending disposition ? ?Level of care: Med-Surg ? ?Consultants:  ?Nephrology ?Palliative care ?ID ?Cardiology ?IR ?Neurosurgery ? ?Objective: ?Vitals:  ? 02/11/22 2015 02/11/22

## 2022-02-12 NOTE — TOC Progression Note (Addendum)
Transition of Care [facility]) - Initial/Assessment Note    Patient Details  Name: Alexander [name] MRN: [id number] Date of Birth: 17-Oct-1961  Transition of [facility]) CM/SW Contact:    Ralene Bathe, LCSWA Phone Number: 02/12/2022, 9:58 AM  Clinical Narrative:                 CSW met with the patient at bedside now that the patient sat in the chair and received dialysis yesterday.  CSW presented bed offers. The patient asked that CSW contact patient's spouse.    CSW contacted the patient's wife and presented bed offers.  [facility]) as chosen.  CSW contacted the facility to verify that they can still accept the patient and is waiting for a final determination.  CSW requested that [facility] CMA's begin insurance auth.   14:55- [employer] requested updated PT/OT notes to review for SNF authorization.  Physical therapist notified.  CSW contacted admissions at [facility] as CSW has not received a determination in reference to accepting the patient. CSW still waiting for response.  Expected Discharge Plan: Home/Self Care Barriers to Discharge: Continued Medical Work up   Patient Goals and CMS Choice Patient states their goals for this hospitalization and ongoing recovery are:: Pt states he would like to return home after hospitalization. CMS Medicare.gov Compare Post Acute Care list provided to:: Patient Choice offered to / list presented to : Patient  Expected Discharge Plan and Services Expected Discharge Plan: Home/Self Care     Post Acute Care Choice: Resumption of Svcs/PTA Provider Living arrangements for the past 2 months: Single Family Home                                      Prior Living Arrangements/Services Living arrangements for the past 2 months: Single Family Home Lives with:: Adult Children, Spouse Patient language and need for interpreter reviewed:: Yes Do you feel safe going back to the place where you live?: Yes      Need for Family  Participation in Patient Care: Yes (Comment) Care giver support system in place?: Yes (comment) Current home services: DME Criminal Activity/Legal Involvement Pertinent to Current Situation/Hospitalization: No - Comment as needed  Activities of Daily Living Home Assistive Devices/Equipment: CBG Meter ADL Screening (condition at time of admission) Patient's cognitive ability adequate to safely complete daily activities?: Yes Is the patient deaf or have difficulty hearing?: No Does the patient have difficulty seeing, even when wearing glasses/contacts?: No Does the patient have difficulty concentrating, remembering, or making decisions?: No Patient able to express need for assistance with ADLs?: Yes Does the patient have difficulty dressing or bathing?: Yes Independently performs ADLs?: No Communication: Independent Dressing (OT): Needs assistance Is this a change from baseline?: Pre-admission baseline Grooming: Independent Feeding: Independent Bathing: Needs assistance Is this a change from baseline?: Change from baseline, expected to last >3 days Toileting: Needs assistance Is this a change from baseline?: Pre-admission baseline In/Out Bed: Needs assistance Is this a change from baseline?: Pre-admission baseline Does the patient have difficulty walking or climbing stairs?: Yes Weakness of Legs: Both Weakness of Arms/Hands: Both  Permission Sought/Granted Permission sought to share information with : Family Supports Permission granted to share information with : No              Emotional Assessment Appearance:: Appears stated age Attitude/Demeanor/Rapport: Engaged Affect (typically observed): Appropriate Orientation: : Oriented to Self, Oriented to  Place, Oriented to  Time, Oriented to Situation Alcohol / Substance Use: Not Applicable Psych Involvement: No (comment)  Admission diagnosis:  Intractable back pain [M54.9] Patient Active Problem List   Diagnosis Date Noted    Goals of care, counseling/discussion    Palliative care by specialist    Dehiscence of amputation stump of right lower extremity (HCC)    Endocarditis 01/21/2022   Ulcer of right leg, limited to breakdown of skin (HCC)    Below-knee amputation of right lower extremity (HCC)    Ulcer of left foot, limited to breakdown of skin (HCC)    History of transmetatarsal amputation of left foot (HCC)    Lumbar back pain with radiculopathy affecting left lower extremity 01/14/2022   Dyslipidemia 01/14/2022   Type 2 diabetes mellitus with chronic kidney disease, with long-term current use of insulin (HCC) 01/14/2022   GERD without esophagitis 01/14/2022   Pressure injury of skin 01/14/2022   Intractable back pain 01/13/2022   Blister 11/08/2021   Severe protein-calorie malnutrition (HCC) 11/02/2021   Pacemaker infection (HCC)    Transaminitis    Tricuspid valve vegetation    Thrombocytopenia (HCC) 10/26/2021   Hyponatremia 10/26/2021   Acute on chronic diastolic CHF (congestive heart failure) (HCC) 10/26/2021   Atrial fibrillation with RVR (HCC) 10/25/2021   Symptomatic anemia 09/30/2021   ESRD on dialysis (HCC) 09/30/2021   PAF (paroxysmal atrial fibrillation) (HCC) 09/30/2021   NSVT (nonsustained ventricular tachycardia) (HCC)    Pancytopenia (HCC)    Acute blood loss anemia 07/18/2020   Hemodialysis patient (HCC) 06/04/2020   Necrotizing glomerulonephritis 06/04/2020   Heme positive stool    Melena    Acute GI bleeding 05/23/2020   Acute hematogenous osteomyelitis of right foot (HCC) 03/11/2020   Gangrene of right foot (HCC)    Diabetic foot ulcer associated with type 2 diabetes mellitus (HCC) 02/19/2020   Heel ulcer (HCC) 11/05/2019   Gangrene of extremity due to atherosclerosis of artery (HCC) 09/09/2017   Essential hypertension, benign 02/07/2015   Renal insufficiency 02/07/2015   Post PTCA 11/09/2014   S/P PTCA (percutaneous transluminal coronary angioplasty) 11/09/2014    Pacemaker: Medtronic Advisa DR MRI [id number] Dual chamber pacemaker 10/08/2014 10/08/2014   Encounter for care of pacemaker 10/08/2014   Sinus node dysfunction (HCC) 10/08/2014   Cardiac asystole (HCC) 10/07/2014   Cardiac syncope 10/07/2014   Diabetes mellitus with stage 3 chronic kidney disease (HCC) 10/07/2014   CAD (coronary artery disease), native coronary artery 10/07/2014   Diabetes mellitus due to underlying condition without complications (HCC) 05/25/2014   Anemia, unspecified 05/25/2014   Syncope 04/30/2014   Atherosclerosis of native arteries of the extremities with gangrene (HCC) 12/16/2013   Acute renal failure (HCC) 10/28/2013   PVD (peripheral vascular disease) (HCC) 10/27/2013   Leukocytosis 10/21/2013   Anemia 10/21/2013   Osteomyelitis of left foot (HCC) 08/19/2013   Spinal stenosis, lumbar region, with neurogenic claudication 06/12/2012    Class: Diagnosis of   Diabetes mellitus with end stage renal disease (HCC) 03/15/2009   ALCOHOL ABUSE 03/15/2009   TOBACCO USE 03/15/2009   HYPERTENSION 03/15/2009   PCP:  Gaspar Garbe, MD Pharmacy:   [id number] - [place], [place] - [address] [place] [address] Phone: [phone] Fax: [phone]  [id number] - [place], [place] - [address] [place] [address] Phone: [phone] Fax: [phone]  [facility] [address] [place] [address] Phone:  [phone] Fax: [phone]     Social Determinants of Health (SDOH) Interventions    Readmission Risk Interventions    10/27/2021    3:51 PM 05/26/2020    1:00 PM 03/15/2020    4:52 PM  Readmission Risk Prevention Plan  Transportation Screening Complete Complete Complete  PCP or Specialist Appt within 5-7 Days   Complete  PCP or Specialist Appt within 3-5 Days Complete    Home Care Screening   Complete  Medication Review (RN CM)    Complete  HRI or Home Care Consult Complete    Social Work Consult for Recovery Care Planning/Counseling Complete    Palliative Care Screening Not Applicable    Medication Review [employer]) Complete Complete   PCP or Specialist appointment within 3-5 days of discharge  Complete   HRI or Home Care Consult  Complete   SW Recovery Care/Counseling Consult  Complete   Palliative Care Screening  Not Applicable   Skilled Nursing Facility  Not Applicable

## 2022-02-12 NOTE — Consult Note (Addendum)
WOC Nurse Consult Note: ?Pt is followed by Dr [name] for post-op stump wound to right AKA, and chronic necrotic wounds to left leg. He performed AKA surgery on 4/22, and states in his progress notes: "I have reviewed patient's arteriogram studies with Dr. [name].  Patient's anastomosis to the bypass graft is distal to the popliteal fossa.  Patient would have difficulty healing above-knee amputation on the left.  With the ischemic changes in the left lower extremity healing of a below-knee amputation would also be difficult.  We will continue to observe the left lower extremity continue dry dressing changes and compression wraps with an Ace wrap to see if this improves or continues to deteriorate.  ?Left anterior and posterior leg and foot with multiple dry eschar full thickness wounds. There is no odor or drainage. ?There were orders in place to remove Prevena Vac dressing after 7 days.  This was applied on 3/22.  Removed Prevena and staples are intact to right stump, well-approximated and no drainage or fluctuance.  ?Dressing procedure/placement/frequency: Topical treatment orders provided for bedside nurses to perform as follows:   ?1. Apply xeroform gauze and abd pad to right stump incision line, then cover with ace wrap. ?2. Apply xeroform gauze and ABD pads to left leg wounds Q day, then cover with kerlex and ace wrap ?Pt should plan to follow-up with Dr [name] for further plan of care after discharge. ?Please re-consult if further assistance is needed.  Thank-you,  ?Julien Girt MSN, RN, [place], [place], CNS ?[phone]  ?

## 2022-02-13 LAB — CBC WITH DIFFERENTIAL/PLATELET
Abs Immature Granulocytes: 0.05 10*3/uL (ref 0.00–0.07)
Basophils Absolute: 0.1 10*3/uL (ref 0.0–0.1)
Basophils Relative: 1 %
Eosinophils Absolute: 0.4 10*3/uL (ref 0.0–0.5)
Eosinophils Relative: 4 %
HCT: 33.6 % — ABNORMAL LOW (ref 39.0–52.0)
Hemoglobin: 10.6 g/dL — ABNORMAL LOW (ref 13.0–17.0)
Immature Granulocytes: 1 %
Lymphocytes Relative: 27 %
Lymphs Abs: 2.9 10*3/uL (ref 0.7–4.0)
MCH: 29.9 pg (ref 26.0–34.0)
MCHC: 31.5 g/dL (ref 30.0–36.0)
MCV: 94.6 fL (ref 80.0–100.0)
Monocytes Absolute: 0.8 10*3/uL (ref 0.1–1.0)
Monocytes Relative: 8 %
Neutro Abs: 6.6 10*3/uL (ref 1.7–7.7)
Neutrophils Relative %: 59 %
Platelets: 135 10*3/uL — ABNORMAL LOW (ref 150–400)
RBC: 3.55 MIL/uL — ABNORMAL LOW (ref 4.22–5.81)
RDW: 17 % — ABNORMAL HIGH (ref 11.5–15.5)
WBC: 10.9 10*3/uL — ABNORMAL HIGH (ref 4.0–10.5)
nRBC: 0 % (ref 0.0–0.2)

## 2022-02-13 LAB — RENAL FUNCTION PANEL
Albumin: 2.3 g/dL — ABNORMAL LOW (ref 3.5–5.0)
Anion gap: 12 (ref 5–15)
BUN: 40 mg/dL — ABNORMAL HIGH (ref 8–23)
CO2: 24 mmol/L (ref 22–32)
Calcium: 8.9 mg/dL (ref 8.9–10.3)
Chloride: 99 mmol/L (ref 98–111)
Creatinine, Ser: 4.77 mg/dL — ABNORMAL HIGH (ref 0.61–1.24)
GFR, Estimated: 13 mL/min — ABNORMAL LOW (ref >60)
Glucose, Bld: 144 mg/dL — ABNORMAL HIGH (ref 70–99)
Phosphorus: 4.8 mg/dL — ABNORMAL HIGH (ref 2.5–4.6)
Potassium: 4 mmol/L (ref 3.5–5.1)
Sodium: 135 mmol/L (ref 135–145)

## 2022-02-13 LAB — GLUCOSE, CAPILLARY
Glucose-Capillary: 217 mg/dL — ABNORMAL HIGH (ref 70–99)
Glucose-Capillary: 133 mg/dL — ABNORMAL HIGH (ref 70–99)
Glucose-Capillary: 154 mg/dL — ABNORMAL HIGH (ref 70–99)
Glucose-Capillary: 99 mg/dL (ref 70–99)

## 2022-02-13 NOTE — Progress Notes (Signed)
OT Cancellation Note ? ?Patient Details ?Name: Alexander [name] ?MRN: [id number] ?DOB: 02/17/1961 ? ? ?Cancelled Treatment:    Reason Eval/Treat Not Completed: Pain limiting ability to participate;Patient declined, no reason specified Initiated OT session though pt reporting pain levels too high. Transport also arriving to take pt to HD. Will follow up as schedule permits ? ?[name] ?02/13/2022, 8:11 AM ?

## 2022-02-13 NOTE — Progress Notes (Deleted)
?PROGRESS NOTE ? ?Alexander [name]:[id number] DOB: 08-Dec-1960 DOA: 01/13/2022 ?PCP: [name], MD ? ? LOS: 31 days  ? ?Brief Narrative / Interim history: ?Alexander [name] is a 61 y.o. male with medical history significant for ESRD on HD on TTS, HTN, HLD, PVD status post right BKA and left midfoot amputation, Afib on Eliquis, CAD, DM2, and status post pacemaker, presented to the ER with worsening intractable low back pain with weakness and radiculopathy of his left lower extremity.  He has chronic back pain followed by spine surgery.  There were plans in place to undergo surgery/decompression.  He underwent a disc aspiration infection has been ruled out, but afterwards he was deemed too high risk candidate for spine surgery.  Due to dry gangrene of the right transtibial amputation site, he underwent an AKA on 3/22 by [name].  He is overall stable, awaiting placement to SNF, and needs to ensure that he is able to tolerate dialysis in the chair as he is quite deconditioned currently ? ?Subjective / 24h Interval events: ?Seen in HD. No complaints ? ?Assesement and Plan: ?Principal Problem: ?  Lumbar back pain with radiculopathy affecting left lower extremity ?Active Problems: ?  ESRD on dialysis [facility]) ?  PAF (paroxysmal atrial fibrillation) ([place]) ?  Essential hypertension, benign ?  Type 2 diabetes mellitus with chronic kidney disease, with long-term current use of insulin ([place]) ?  GERD without esophagitis ?  Gangrene of extremity due to atherosclerosis of artery ([place]) ?  Pacemaker infection ([place]) ?  Severe protein-calorie malnutrition ([place]) ?  Pressure injury of skin ?  Ulcer of right leg, limited to breakdown of skin ([place]) ?  Below-knee amputation of right lower extremity ([place]) ?  Endocarditis ?  Dehiscence of amputation stump of right lower extremity (HCC) ?  Goals of care, counseling/discussion ?  Palliative care by specialist ? ? ?Assessment and Plan: ?Principal problem ?L2-3 severe spinal stenosis, back  pain-with left lower extremity radiculopathy.  CT showed moderate neuroforaminal stenosis at L2-L3, L5-S1.  An MRI showed subacute fractures at the L2-L3 disc and severe multifactorial spinal stenosis.  Neurosurgery consulted, he is deemed too high risk candidate for spinal surgery.  He completed a course of IV Decadron.  IR completed disc aspiration on 3/10 without evidence of an infection.  ID consulted and evaluated patient as well.  Pain reasonably well controlled with oral Dilaudid and Robaxin, appreciate palliative care input.  ? ?Active problems ?Peripheral vascular disease, dry gangrene of the right transtibial amputation site-poor healing, [name] consulted and patient is status post right AKA on 3/22. ? ?Chronic pacemaker infection, possible endocarditis on anterior mitral leaflet-seen by ID as an outpatient, currently he is on suppressive therapy with doxycycline and amoxicillin indefinitely.  Continue ? ?Left metatarsal wound-poor healing, AKA not possible due to arterial graft.  For now managed conservatively, he is on suppressive antibiotics at baseline ? ?ESRD-nephrology following, getting dialysis.  Tolerated in the chair yesterday ? ?Severe TR with flail tricuspid valve leaflet-noted, volume status managed by dialysis ? ?Chronic systolic CHF-most recent 2D echo done March 2023 shows EF of 30-35%, global hypokinesis of the LV.  RV was normal.  PA pressure was moderately elevated.  Currently appears euvolemic, volume management per HD ? ?History of sinus node dysfunction and complete heart block-status post pacemaker 2015.  Concern for pacemaker infection on suppressive antibiotics as above ? ?Coronary artery disease-with history of PCI.  Seeing Dr. [name] ? ?Paroxysmal A-fib-continue Lopressor, Eliquis.  Cardiology  saw him during this hospital stay ? ?Essential hypertension-controlled, continue to monitor blood pressure ? ?Hyperlipidemia-continue statin ? ?Anemia of chronic renal disease-no bleeding,  continue to monitor ? ?Disposition-extremely deconditioned, PT recommends SNF.  Placement pending ? ?Type 2 diabetes mellitus-continue insulin regimen as below.  Most recent A1c 6.8.  CBGs controlled ? ?CBG (last 3)  ?Recent Labs  ?  02/13/22 ?0717 02/13/22 ?1305 02/13/22 ?1547  ?GLUCAP 217* 133* 154*  ? ? ? ?Pressure Injury 01/14/22 Ischial tuberosity Right Stage 3 -  Full thickness tissue loss. Subcutaneous fat may be visible but bone, tendon or muscle are NOT exposed. (Active)  ?01/14/22 0321  ?Location: Ischial tuberosity  ?Location Orientation: Right  ?Staging: Stage 3 -  Full thickness tissue loss. Subcutaneous fat may be visible but bone, tendon or muscle are NOT exposed.  ?Wound Description (Comments):   ?Present on Admission: Yes  ?   ?Pressure Injury 01/14/22 Buttocks Right Stage 2 -  Partial thickness loss of dermis presenting as a shallow open injury with a red, pink wound bed without slough. (Active)  ?01/14/22 0322  ?Location: Buttocks  ?Location Orientation: Right  ?Staging: Stage 2 -  Partial thickness loss of dermis presenting as a shallow open injury with a red, pink wound bed without slough.  ?Wound Description (Comments):   ?Present on Admission: Yes  ?   ?Pressure Injury 01/14/22 Buttocks Posterior;Proximal;Right Stage 2 -  Partial thickness loss of dermis presenting as a shallow open injury with a red, pink wound bed without slough. (Active)  ?01/14/22 0324  ?Location: Buttocks  ?Location Orientation: Posterior;Proximal;Right  ?Staging: Stage 2 -  Partial thickness loss of dermis presenting as a shallow open injury with a red, pink wound bed without slough.  ?Wound Description (Comments):   ?Present on Admission: Yes  ? ? ?Scheduled Meds: ? (feeding supplement) PROSource Plus  30 mL Oral BID BM  ? amoxicillin  500 mg Oral Q24H  ? apixaban  5 mg Oral BID  ? vitamin C  1,000 mg Oral Daily  ? atorvastatin  20 mg Oral QHS  ? calcitRIOL  0.25 mcg Oral Daily  ? darbepoetin (ARANESP) injection -  DIALYSIS  100 mcg Intravenous Q Thu-HD  ? docusate sodium  100 mg Oral Daily  ? doxycycline  100 mg Oral Q12H  ? feeding supplement (NEPRO CARB STEADY)  237 mL Oral BID BM  ? furosemide  80 mg Oral Once per day on Sun Mon Wed Fri  ? gabapentin  200 mg Oral QHS  ? insulin aspart  0-6 Units Subcutaneous TID WC  ? insulin aspart protamine- aspart  10 Units Subcutaneous BID WC  ? lidocaine  1 patch Transdermal Q24H  ? metoprolol tartrate  25 mg Oral BID  ? multivitamin with minerals  1 tablet Oral Once per day on Mon Thu  ? nutrition supplement (JUVEN)  1 packet Oral BID BM  ? pantoprazole  40 mg Oral Daily  ? sodium chloride flush  3 mL Intravenous Q12H  ? zinc sulfate  220 mg Oral Daily  ? ?Continuous Infusions: ? sodium chloride    ? sodium chloride    ? magnesium sulfate bolus IVPB    ? ?PRN Meds:.sodium chloride, sodium chloride, bisacodyl, guaiFENesin-dextromethorphan, hydrALAZINE, HYDROmorphone (DILAUDID) injection, HYDROmorphone, labetalol, lidocaine (PF), lidocaine-prilocaine, magnesium sulfate bolus IVPB, methocarbamol, metoprolol tartrate, Muscle Rub, ondansetron **OR** ondansetron (ZOFRAN) IV, ondansetron, pentafluoroprop-tetrafluoroeth, phenol, polyethylene glycol, sodium chloride, sodium chloride flush, traMADol, traZODone ? ?Diet Orders (From admission, onward)  ? ?  Start     Ordered  ? 01/31/22 1523  Diet renal with fluid restriction Fluid restriction: 1200 mL Fluid; Room service appropriate? Yes; Fluid consistency: Thin  Diet effective now       ?Question Answer Comment  ?Fluid restriction: 1200 mL Fluid   ?Room service appropriate? Yes   ?Fluid consistency: Thin   ?  ? 01/31/22 1522  ? ?  ?  ? ?  ? ? ?DVT prophylaxis: SCD's Start: 01/31/22 1523 ?apixaban (ELIQUIS) tablet 5 mg  ? ?Lab Results  ?Component Value Date  ? PLT 135 (L) 02/13/2022  ? ? ?  Code Status: Full Code ? ?Family Communication: No family at bedside ? ?Status is: Inpatient ? ?Remains inpatient appropriate because: Pending  disposition ? ?Level of care: Med-Surg ? ?Consultants:  ?Nephrology ?Palliative care ?ID ?Cardiology ?IR ?Neurosurgery ? ?Objective: ?Vitals:  ? 02/13/22 1130 02/13/22 1200 02/13/22 1203 02/13/22 1310  ?BP: (!) 100/53 Marland Kitchen

## 2022-02-13 NOTE — Progress Notes (Signed)
?[facility] ?Progress Note  ? ?Subjective:    ?Seen and examined patient on HD. No complaints. Denies SOB, BP, and N/V. UFG 3L. ? ?Objective ?Vitals:  ? 02/13/22 0900 02/13/22 0930 02/13/22 1000 02/13/22 1020  ?BP: (!) 99/34 (!) 94/49 (!) 86/35 (!) 101/40  ?Pulse: 89 86 100 76  ?Resp:      ?Temp:      ?TempSrc:      ?SpO2:      ?Weight:      ?Height:      ? ?Physical Exam ?General: Awake and alert; NAD ?Heart: Normal S1 and S2; No murmurs, gallops, or rubs ?Lungs: Clear throughout; No wheezing, rales, or rhonchi ?Abdomen: Soft and non-tender ?Extremities: No edema R thigh or LLE; R BKA-wound vac in place; LLE wrapped ?Dialysis Access: RUE AVF (+) B/T  ? ?Filed Weights  ? 02/08/22 0740 02/08/22 1155 02/13/22 0823  ?Weight: 83.2 kg 80.7 kg 82.4 kg  ? ? ?Intake/Output Summary (Last 24 hours) at 02/13/2022 1032 ?Last data filed at 02/13/2022 0500 ?Gross per 24 hour  ?Intake 860 ml  ?Output 0 ml  ?Net 860 ml  ? ? ?Additional Objective ?Labs: ?Basic Metabolic Panel: ?Recent Labs  ?Lab 02/08/22 ?0801 02/13/22 ?0403  ?NA 134* 135  ?K 4.0 4.0  ?CL 97* 99  ?CO2 26 24  ?GLUCOSE 165* 144*  ?BUN 42* 40*  ?CREATININE 3.22* 4.77*  ?CALCIUM 8.6* 8.9  ?PHOS 4.3 4.8*  ? ?Liver Function Tests: ?Recent Labs  ?Lab 02/08/22 ?0801 02/13/22 ?0403  ?ALBUMIN 2.2* 2.3*  ? ?No results for input(s): LIPASE, AMYLASE in the last 168 hours. ?CBC: ?Recent Labs  ?Lab 02/08/22 ?0802 02/13/22 ?0403  ?WBC 10.5 10.9*  ?NEUTROABS  --  6.6  ?HGB 10.1* 10.6*  ?HCT 32.5* 33.6*  ?MCV 95.0 94.6  ?PLT 192 135*  ? ?Blood Culture ?   ?Component Value Date/Time  ? SDES WOUND 01/19/2022 1452  ? SPECREQUEST L3/L4 [place] ASPIRATION 01/19/2022 1452  ? CULT  01/19/2022 1452  ?  No growth aerobically or anaerobically. ?Performed at [facility], [place] [address] [place], [place] [address] ?  ? REPTSTATUS 01/24/2022 FINAL 01/19/2022 1452  ? ? ?Cardiac Enzymes: ?No results for input(s): CKTOTAL, CKMB, CKMBINDEX, TROPONINI in the last 168  hours. ?CBG: ?Recent Labs  ?Lab 02/12/22 ?0724 02/12/22 ?1127 02/12/22 ?1606 02/12/22 ?2031 02/13/22 ?0717  ?GLUCAP 105* 100* 150* 116* 217*  ? ?Iron Studies: No results for input(s): IRON, TIBC, TRANSFERRIN, FERRITIN in the last 72 hours. ?Lab Results  ?Component Value Date  ? INR 1.4 (H) 01/18/2022  ? INR 1.9 (H) 01/17/2022  ? INR 3.0 (H) 10/30/2021  ? ?Studies/Results: ?No results found. ? ?Medications: ? sodium chloride    ? sodium chloride    ? sodium chloride    ? sodium chloride    ? magnesium sulfate bolus IVPB    ? ? (feeding supplement) PROSource Plus  30 mL Oral BID BM  ? amoxicillin  500 mg Oral Q24H  ? apixaban  5 mg Oral BID  ? vitamin C  1,000 mg Oral Daily  ? atorvastatin  20 mg Oral QHS  ? calcitRIOL  0.25 mcg Oral Daily  ? darbepoetin (ARANESP) injection - DIALYSIS  100 mcg Intravenous Q Thu-HD  ? docusate sodium  100 mg Oral Daily  ? doxycycline  100 mg Oral Q12H  ? feeding supplement (NEPRO CARB STEADY)  237 mL Oral BID BM  ? furosemide  80 mg Oral Once per day  on Sun Mon Wed Fri  ? gabapentin  200 mg Oral QHS  ? insulin aspart  0-6 Units Subcutaneous TID WC  ? insulin aspart protamine- aspart  10 Units Subcutaneous BID WC  ? lidocaine  1 patch Transdermal Q24H  ? metoprolol tartrate  25 mg Oral BID  ? multivitamin with minerals  1 tablet Oral Once per day on Mon Thu  ? nutrition supplement (JUVEN)  1 packet Oral BID BM  ? pantoprazole  40 mg Oral Daily  ? sodium chloride flush  3 mL Intravenous Q12H  ? zinc sulfate  220 mg Oral Daily  ? ? ?Dialysis Orders: ?TTS - GOC ?4hrs35mn, BFR 400, DFR 800,  EDW 94kg, 3K/ 2.5Ca ?-No Heparin ?-Mircera 200 mcg IV q 2wks - last 01/11/22 ?-Calcitriol 0.234m PO TIW ? ?Assessment/Plan: ?1 Lumbar back pain/Radiculopathy: Followed by Spine Surgery Dr. CrSaintclair HalstedMR Lumbar Spine completed: showed fractures L2-L3 with fluid-? Trauma vs. Infection. Neurosurgeon canceled surgery secondary to high risk. L2-3 disc aspiration 01/19/2022 culture negative.Continue pain  management and Pt/OT per primary ?2. PVD with Dry gangrene:R AKA on Wednesday 01/31/2022 per Dr. DuSharol Given ?3. ESRD - on HD TTS. Tolerated dialysis in the chair previously. Informed by HD RN of patient needing hoyer lift to place in chair. Will need to place patient in chair for next tx. HD today. ?4. Hypertension/volume  -BP & Volume are relatively stable. Lower EDW on discharge. UF as tolerated.  ?5. Anemia of CKD - Hgb now 10.6. Given Aranesp 100 mcg IV 02/01/2022. Follow HGB. Transfuse PRN.  ? 6. Secondary Hyperparathyroidism - PO4 and calcium at goal. Continue VDRA. ?7. Nutrition - Renal diet on fluid restriction. Albumin is low. On protein supps, renal vitamins  ?8. DMT2-per primary ?9. 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 On ABX. Follows with cardiology.  ?10.Dispo-awaiting SNF for short-term rehab ? ? ?CoTobie PoetNP ?[facility] ?02/13/2022,10:32 AM ? LOS: 31 days  ?  ?

## 2022-02-13 NOTE — TOC Progression Note (Addendum)
Transition of Care (TOC) - Initial/Assessment Note  ? ? ?Patient Details  ?Name: Alexander [name] ?MRN: [id number] ?Date of Birth: December 11, 1960 ? ?Transition of Care (TOC) CM/SW Contact:    ?Paulene Floor Bern Fare, LCSWA ?Phone Number: ?02/13/2022, 2:45 PM ? ?Clinical Narrative:                 ?CSW contacted admissions at [facility] and was informed that the referral is being reviewed.  ? ?CSW contacted [facility] leadership and requested assistance with receiving a determination from the facility as insurance auth has been approved.   ? ?15:57-  CSW notified by facility that the patient can be accepted.  Insurance auth received.  (Approved from 4/3 - 4/5, next review 4/5.  Reference ID: [id number]) ? ? ?16:27-  CSW spoke with Otila Kluver at [facility].  The facility requested that the patient be admitted tomorrow so that transportation to dialysis can be arranged.  MD and patient notified.   ? ?Expected Discharge Plan: Home/Self Care ?Barriers to Discharge: Continued Medical Work up ? ? ?Patient Goals and CMS Choice ?Patient states their goals for this hospitalization and ongoing recovery are:: Pt states he would like to return home after hospitalization. ?CMS Medicare.gov Compare Post Acute Care list provided to:: Patient ?Choice offered to / list presented to : Patient ? ?Expected Discharge Plan and Services ?Expected Discharge Plan: Home/Self Care ?  ?  ?Post Acute Care Choice: Resumption of Svcs/PTA Provider ?Living arrangements for the past 2 months: [place] ?                ?  ?  ?  ?  ?  ?  ?  ?  ?  ?  ? ?Prior Living Arrangements/Services ?Living arrangements for the past 2 months: [place] ?Lives with:: Adult Children, Spouse ?Patient language and need for interpreter reviewed:: Yes ?Do you feel safe going back to the place where you live?: Yes      ?Need for Family Participation in Patient Care: Yes (Comment) ?Care giver support system in place?: Yes (comment) ?Current home services: DME ?Criminal Activity/Legal  Involvement Pertinent to Current Situation/Hospitalization: No - Comment as needed ? ?Activities of Daily Living ?Home Assistive Devices/Equipment: CBG Meter ?ADL Screening (condition at time of admission) ?Patient's cognitive ability adequate to safely complete daily activities?: Yes ?Is the patient deaf or have difficulty hearing?: No ?Does the patient have difficulty seeing, even when wearing glasses/contacts?: No ?Does the patient have difficulty concentrating, remembering, or making decisions?: No ?Patient able to express need for assistance with ADLs?: Yes ?Does the patient have difficulty dressing or bathing?: Yes ?Independently performs ADLs?: No ?Communication: Independent ?Dressing (OT): Needs assistance ?Is this a change from baseline?: Pre-admission baseline ?Grooming: Independent ?Feeding: Independent ?Bathing: Needs assistance ?Is this a change from baseline?: Change from baseline, expected to last >3 days ?Toileting: Needs assistance ?Is this a change from baseline?: Pre-admission baseline ?In/Out Bed: Needs assistance ?Is this a change from baseline?: Pre-admission baseline ?Does the patient have difficulty walking or climbing stairs?: Yes ?Weakness of Legs: Both ?Weakness of Arms/Hands: Both ? ?Permission Sought/Granted ?Permission sought to share information with : Family Supports ?Permission granted to share information with : No ?   ?   ?   ?   ? ?Emotional Assessment ?Appearance:: Appears stated age ?Attitude/Demeanor/Rapport: Engaged ?Affect (typically observed): Appropriate ?Orientation: : Oriented to Self, Oriented to Place, Oriented to  Time, Oriented to Situation ?Alcohol / Substance Use: Not Applicable ?Psych Involvement: No (comment) ? ?  Admission diagnosis:  Intractable back pain [M54.9] ?Patient Active Problem List  ? Diagnosis Date Noted  ? Goals of care, counseling/discussion   ? Palliative care by specialist   ? Dehiscence of amputation stump of right lower extremity ([place])   ?  Endocarditis 01/21/2022  ? Ulcer of right leg, limited to breakdown of skin ([place])   ? Below-knee amputation of right lower extremity ([place])   ? Ulcer of left foot, limited to breakdown of skin ([place])   ? History of transmetatarsal amputation of left foot ([place])   ? Lumbar back pain with radiculopathy affecting left lower extremity 01/14/2022  ? Dyslipidemia 01/14/2022  ? Type 2 diabetes mellitus with chronic kidney disease, with long-term current use of insulin ([place]) 01/14/2022  ? GERD without esophagitis 01/14/2022  ? Pressure injury of skin 01/14/2022  ? Intractable back pain 01/13/2022  ? Blister 11/08/2021  ? Severe protein-calorie malnutrition ([place]) 11/02/2021  ? Pacemaker infection ([place])   ? Transaminitis   ? Tricuspid valve vegetation   ? Thrombocytopenia ([place]) 10/26/2021  ? Hyponatremia 10/26/2021  ? Acute on chronic diastolic CHF (congestive heart failure) ([place]) 10/26/2021  ? Atrial fibrillation with RVR ([place]) 10/25/2021  ? Symptomatic anemia 09/30/2021  ? ESRD on dialysis [facility]) 09/30/2021  ? PAF (paroxysmal atrial fibrillation) ([place]) 09/30/2021  ? NSVT (nonsustained ventricular tachycardia) ([place])   ? Pancytopenia ([place])   ? Acute blood loss anemia 07/18/2020  ? Hemodialysis patient ([place]) 06/04/2020  ? Necrotizing glomerulonephritis 06/04/2020  ? Heme positive stool   ? Melena   ? Acute GI bleeding 05/23/2020  ? Acute hematogenous osteomyelitis of right foot ([place]) 03/11/2020  ? Gangrene of right foot ([place])   ? Diabetic foot ulcer associated with type 2 diabetes mellitus ([place]) 02/19/2020  ? Heel ulcer ([place]) 11/05/2019  ? Gangrene of extremity due to atherosclerosis of artery ([place]) 09/09/2017  ? Essential hypertension, benign 02/07/2015  ? Renal insufficiency 02/07/2015  ? Post PTCA 11/09/2014  ? S/P PTCA (percutaneous transluminal coronary angioplasty) 11/09/2014  ? Pacemaker: [place] DR MRI [id number] Dual chamber pacemaker 10/08/2014 10/08/2014  ? Encounter for care of pacemaker 10/08/2014  ? Sinus node  dysfunction ([place]) 10/08/2014  ? Cardiac asystole ([place]) 10/07/2014  ? Cardiac syncope 10/07/2014  ? Diabetes mellitus with stage 3 chronic kidney disease ([place]) 10/07/2014  ? CAD (coronary artery disease), native coronary artery 10/07/2014  ? Diabetes mellitus due to underlying condition without complications ([place]) 28/78/6767  ? Anemia, unspecified 05/25/2014  ? Syncope 04/30/2014  ? Atherosclerosis of native arteries of the extremities with gangrene ([place]) 12/16/2013  ? Acute renal failure ([place]) 10/28/2013  ? PVD (peripheral vascular disease) ([place]) 10/27/2013  ? Leukocytosis 10/21/2013  ? Anemia 10/21/2013  ? Osteomyelitis of left foot ([place]) 08/19/2013  ? Spinal stenosis, lumbar region, with neurogenic claudication 06/12/2012  ?  Class: Diagnosis of  ? Diabetes mellitus with end stage renal disease ([place]) 03/15/2009  ? ALCOHOL ABUSE 03/15/2009  ? TOBACCO USE 03/15/2009  ? HYPERTENSION 03/15/2009  ? ?PCP:  [name], MD ?Pharmacy:   ?[facility] [place] ?[place] ?[place] [address] ?Phone: [phone] Fax: [phone] ? ?[id number] [place] [place] - [id number]?Phone: [phone]: [phone]? ?[facility] ?[address]?Phone: [phone]: [phone] ? ? ? ? ?Social Determinants of Health (SDOH) Interventions ?  ? ?Readmission Risk Interventions ? ?  10/27/2021  ?  3:51 PM 05/26/2020  ?  1:00 PM 03/15/2020  ?  4:52 PM  ?Readmission Risk Prevention Plan  ?Transportation Screening Complete Complete Complete  ?PCP or Specialist Appt within 5-7 Days   Complete  ?PCP or Specialist Appt within 3-5 Days Complete    ?Home Care Screening   Complete  ?Medication Review (RN CM)   Complete  ?[place] or Home Care Consult Complete    ?Social Work Consult for [place] Planning/Counseling Complete    ?Palliative Care Screening  Not Applicable    ?Medication Review Press photographer) Complete Complete   ?PCP or Specialist appointment within 3-5 days of discharge  Complete   ?[place] or Home Care Consult  Complete   ?SW Recovery Care/Counseling Co

## 2022-02-14 ENCOUNTER — Inpatient Hospital Stay: Payer: Medicare Other | Admitting: Internal Medicine

## 2022-02-14 DIAGNOSIS — I480 Paroxysmal atrial fibrillation: Secondary | ICD-10-CM | POA: Diagnosis not present

## 2022-02-14 DIAGNOSIS — R279 Unspecified lack of coordination: Secondary | ICD-10-CM | POA: Diagnosis not present

## 2022-02-14 DIAGNOSIS — N2581 Secondary hyperparathyroidism of renal origin: Secondary | ICD-10-CM | POA: Diagnosis not present

## 2022-02-14 DIAGNOSIS — Z950 Presence of cardiac pacemaker: Secondary | ICD-10-CM | POA: Diagnosis not present

## 2022-02-14 DIAGNOSIS — M6281 Muscle weakness (generalized): Secondary | ICD-10-CM | POA: Diagnosis not present

## 2022-02-14 DIAGNOSIS — S88111A Complete traumatic amputation at level between knee and ankle, right lower leg, initial encounter: Secondary | ICD-10-CM | POA: Diagnosis not present

## 2022-02-14 DIAGNOSIS — K219 Gastro-esophageal reflux disease without esophagitis: Secondary | ICD-10-CM | POA: Diagnosis not present

## 2022-02-14 DIAGNOSIS — N186 End stage renal disease: Secondary | ICD-10-CM | POA: Diagnosis not present

## 2022-02-14 DIAGNOSIS — E0821 Diabetes mellitus due to underlying condition with diabetic nephropathy: Secondary | ICD-10-CM | POA: Diagnosis not present

## 2022-02-14 DIAGNOSIS — D631 Anemia in chronic kidney disease: Secondary | ICD-10-CM | POA: Diagnosis not present

## 2022-02-14 DIAGNOSIS — I5022 Chronic systolic (congestive) heart failure: Secondary | ICD-10-CM | POA: Diagnosis not present

## 2022-02-14 DIAGNOSIS — Z992 Dependence on renal dialysis: Secondary | ICD-10-CM | POA: Diagnosis not present

## 2022-02-14 DIAGNOSIS — T8781 Dehiscence of amputation stump: Secondary | ICD-10-CM | POA: Diagnosis not present

## 2022-02-14 DIAGNOSIS — D509 Iron deficiency anemia, unspecified: Secondary | ICD-10-CM | POA: Diagnosis not present

## 2022-02-14 DIAGNOSIS — R262 Difficulty in walking, not elsewhere classified: Secondary | ICD-10-CM | POA: Diagnosis not present

## 2022-02-14 DIAGNOSIS — E785 Hyperlipidemia, unspecified: Secondary | ICD-10-CM | POA: Diagnosis not present

## 2022-02-14 DIAGNOSIS — R2689 Other abnormalities of gait and mobility: Secondary | ICD-10-CM | POA: Diagnosis not present

## 2022-02-14 DIAGNOSIS — Z89611 Acquired absence of right leg above knee: Secondary | ICD-10-CM | POA: Diagnosis not present

## 2022-02-14 DIAGNOSIS — E1129 Type 2 diabetes mellitus with other diabetic kidney complication: Secondary | ICD-10-CM | POA: Diagnosis not present

## 2022-02-14 DIAGNOSIS — M5416 Radiculopathy, lumbar region: Secondary | ICD-10-CM | POA: Diagnosis not present

## 2022-02-14 DIAGNOSIS — R1314 Dysphagia, pharyngoesophageal phase: Secondary | ICD-10-CM | POA: Diagnosis not present

## 2022-02-14 DIAGNOSIS — I739 Peripheral vascular disease, unspecified: Secondary | ICD-10-CM | POA: Diagnosis not present

## 2022-02-14 DIAGNOSIS — I120 Hypertensive chronic kidney disease with stage 5 chronic kidney disease or end stage renal disease: Secondary | ICD-10-CM | POA: Diagnosis not present

## 2022-02-14 DIAGNOSIS — I10 Essential (primary) hypertension: Secondary | ICD-10-CM | POA: Diagnosis not present

## 2022-02-14 DIAGNOSIS — M4807 Spinal stenosis, lumbosacral region: Secondary | ICD-10-CM | POA: Diagnosis not present

## 2022-02-14 LAB — GLUCOSE, CAPILLARY
Glucose-Capillary: 118 mg/dL — ABNORMAL HIGH (ref 70–99)
Glucose-Capillary: 110 mg/dL — ABNORMAL HIGH (ref 70–99)
Glucose-Capillary: 140 mg/dL — ABNORMAL HIGH (ref 70–99)

## 2022-02-14 MED ORDER — SODIUM CHLORIDE 0.9 % IV SOLN: 100.0000 mL | INTRAVENOUS | Status: DC | PRN

## 2022-02-14 MED ORDER — ALTEPLASE 2 MG IJ SOLR: 2.0000 mg | Freq: Once | INTRAMUSCULAR | Status: DC | PRN

## 2022-02-14 MED ORDER — HEPARIN SODIUM (PORCINE) 1000 UNIT/ML DIALYSIS: 1000.0000 [IU] | INTRAMUSCULAR | Status: DC | PRN

## 2022-02-14 MED ORDER — CHLORHEXIDINE GLUCONATE CLOTH 2 % EX PADS: 6.0000 | MEDICATED_PAD | Freq: Every day | CUTANEOUS | Status: DC

## 2022-02-14 NOTE — Plan of Care (Signed)
  Problem: Health Behavior/Discharge Planning: Goal: Ability to manage health-related needs will improve Outcome: Progressing   Problem: Clinical Measurements: Goal: Ability to maintain clinical measurements within normal limits will improve Outcome: Progressing   Problem: Activity: Goal: Risk for activity intolerance will decrease Outcome: Progressing   Problem: Pain Managment: Goal: General experience of comfort will improve Outcome: Progressing   

## 2022-02-14 NOTE — Progress Notes (Signed)
Physical Therapy Treatment ?Patient Details ?Name: Alexander [name] ?MRN: [id number] ?DOB: Jul 09, 1961 ?Today's Date: 02/14/2022 ? ? ?History of Present Illness The pt is a 61 yo male presenting 3/4 with chronic back pain and weakness to LLE in addition to frequent falls (x2 on day of admission). Pt found to have severe L2-L3 stenosis but surgery was too high risk. Now s/p R AKA on 3/22. PMH: ESRD on HD TTS, paroxysmal atrial fibrillation on Eliquis, pacemaker, CAD s/p stent, diastolic CHF, PVD with right BKA and left midfoot amputation, history of GI bleed, and DM II. ? ?  ?PT Comments  ? ? Patient progressing towards physical therapy goals. Patient continues to be self limiting and perseverative on pain stating 11/10 pain with calm affect. Patient able to sit EOB with close supervision and perform activities such as L/R lateral leaning and simulated scooting on EOB. Continue to recommend SNF for ongoing Physical Therapy.    ?   ?Recommendations for follow up therapy are one component of a multi-disciplinary discharge planning process, led by the attending physician.  Recommendations may be updated based on patient status, additional functional criteria and insurance authorization. ? ?Follow Up Recommendations ? Skilled nursing-short term rehab (<3 hours/day) ?  ?  ?Assistance Recommended at Discharge Frequent or constant Supervision/Assistance  ?Patient can return home with the following Two people to help with walking and/or transfers;Help with stairs or ramp for entrance ?  ?Equipment Recommendations ? Hospital bed;Other (comment) (hoyer lift)  ?  ?Recommendations for Other Services   ? ? ?  ?Precautions / Restrictions Precautions ?Precautions: Fall;Other (comment) ?Precaution Comments: new Rt AKA, hx of left midfoot amputation,  also with new lumbar stenosis ?Restrictions ?Weight Bearing Restrictions: Yes ?RLE Weight Bearing: Non weight bearing  ?  ? ?Mobility ? Bed Mobility ?Overal bed mobility: Needs Assistance ?Bed  Mobility: Sidelying to Sit, Sit to Supine ?  ?Sidelying to sit: Mod assist, +2 for safety/equipment ?  ?  ?Sit to sidelying: Min assist ?General bed mobility comments: Initially Mod A x 2 to lift trunk EOB with cues for hand placement, progressing to min guard to lift trunk pushing from elbow during sidelying. Min A to get L LE back into bed ?  ? ?Transfers ?Overall transfer level: Needs assistance ?  ?  ?  ?  ?  ?  ?  ?  ?General transfer comment: practiced scooting along bedside with pushing through B UE vs UE and bedrail support at Max A x 2 ?  ? ?Ambulation/Gait ?  ?  ?  ?  ?  ?  ?  ?  ? ? ?Stairs ?  ?  ?  ?  ?  ? ? ?Wheelchair Mobility ?  ? ?Modified Rankin (Stroke Patients Only) ?  ? ? ?  ?Balance Overall balance assessment: Needs assistance ?Sitting-balance support: Feet supported, Bilateral upper extremity supported ?Sitting balance-Leahy Scale: Fair ?Sitting balance - Comments: Able to sit without UE support on EOB. Prefers UE support ?  ?  ?  ?  ?  ?  ?  ?  ?  ?  ?  ?  ?  ?  ?  ?  ? ?  ?Cognition Arousal/Alertness: Awake/alert ?Behavior During Therapy: [facility] for tasks assessed/performed ?Overall Cognitive Status: Within Functional Limits for tasks assessed ?  ?  ?  ?  ?  ?  ?  ?  ?  ?  ?  ?  ?  ?  ?  ?  ?General Comments: self  limiting, perseverating on pain (calm affect but reports 10/10 pain) ?  ?  ? ?  ?Exercises Other Exercises ?Other Exercises: place washcloth under L foot on floor. Pt able to perform small arc ROM in all planes utilizing hip musculature ? ?  ?General Comments   ?  ?  ? ?Pertinent Vitals/Pain Pain Assessment ?Pain Assessment: 0-10 ?Pain Score: 10-Worst pain ever ?Pain Location: back ?Pain Descriptors / Indicators: Grimacing, Guarding ?Pain Intervention(s): Monitored during session  ? ? ?Home Living   ?  ?  ?  ?  ?  ?  ?  ?  ?  ?   ?  ?Prior Function    ?  ?  ?   ? ?PT Goals (current goals can now be found in the care plan section) Acute Rehab PT Goals ?PT Goal Formulation: With  patient ?Time For Goal Achievement: 02/15/22 ?Potential to Achieve Goals: Fair ?Progress towards PT goals: Progressing toward goals ? ?  ?Frequency ? ? ? Min 2X/week ? ? ? ?  ?PT Plan Current plan remains appropriate  ? ? ?Co-evaluation PT/OT/SLP Co-Evaluation/Treatment: Yes ?Reason for Co-Treatment: To address functional/ADL transfers ?PT goals addressed during session: Mobility/safety with mobility;Balance ?OT goals addressed during session: ADL's and self-care;Strengthening/ROM ?  ? ?  ?AM-PAC PT "6 Clicks" Mobility   ?Outcome Measure ? Help needed turning from your back to your side while in a flat bed without using bedrails?: A Little ?Help needed moving from lying on your back to sitting on the side of a flat bed without using bedrails?: A Lot ?Help needed moving to and from a bed to a chair (including a wheelchair)?: Total ?Help needed standing up from a chair using your arms (e.g., wheelchair or bedside chair)?: Total ?Help needed to walk in hospital room?: Total ?Help needed climbing 3-5 steps with a railing? : Total ?6 Click Score: 9 ? ?  ?End of Session   ?Activity Tolerance: Patient tolerated treatment well ?Patient left: in bed;with call bell/phone within reach ?Nurse Communication: Mobility status ?PT Visit Diagnosis: Pain;Unsteadiness on feet (R26.81);Repeated falls (R29.6) ?  ? ? ?Time: 0131-4388 ?PT Time Calculation (min) (ACUTE ONLY): 27 min ? ?Charges:  $Therapeutic Activity: 8-22 mins          ?          ? ?Ares Tegtmeyer A. Gilford Rile, PT, DPT ?Acute Rehabilitation Services ?Pager [phone] ?Office [phone] ? ? ? ?Justin Buechner A Zabdiel Dripps ?02/14/2022, 1:26 PM ? ?

## 2022-02-14 NOTE — Progress Notes (Signed)
?      ? ? ? [facility] ?                                                                            ? ?Patient Demographics ? ?Alexander [name], is a 61 y.o. male, DOB - 1961/09/08, RDE:[id number] ? ?Admit date - 01/13/2022   Admitting Physician Christel Mormon, MD ? ?Outpatient Primary MD for the patient is Tisovec, [name], MD ? ?Outpatient specialists:  ? ?LOS - 32  days  ? ?Medical records reviewed and are as summarized below: ? ? ? ?Chief Complaint  ?Patient presents with  ? Back Pain  ? Weakness  ?    ? ?Brief summary  ?Alexander [name] is a 61 y.o. Caucasian male with medical history significant for end-stage renal disease on HD on TTS, hypertension, dyslipidemia, peripheral vascular disease, status post right BKA and left midfoot amputation, atrial fibrillation on Eliquis, coronary artery disease, type 2 diabetes mellitus, and status post pacemaker, presented to the ER with onset of intractable low back pain over the last couple of days with associated left lower extremity radiation and radiculopathy with associated spasm and weakness.  No fever or chills.  No nausea or vomiting or abdominal pain.  No chest pain or palpitations.  No cough or wheezing or dyspnea.  He denies any urinary or stool incontinence.  The patient just had his ast hemodialysis session today.  He has been having chronic low back pain with occasional radiation to both lower extremities.  He was scheduled for LS spine MRI on 02/07/2022.  He was noted to have significant edema with weeping in both lower extremities with recent Ortho follow-up status post his amputation.  This was managed with compression wraps and close follow-up. ? ? ? ?Assessment & Plan  ? ? ?L2-3 severe spinal stenosis, back pain ?- with left lower extremity radiculopathy. ?-  CT showed moderate neuroforaminal stenosis at L2-L3, L5-S1.   ?-MRI showed subacute fractures at the L2-L3 disc and severe multifactorial spinal stenosis.   ?-Neurosurgery was consulted, he is deemed  too high risk candidate for spinal surgery.  ?- He completed a course of IV Decadron.  IR completed disc aspiration on 3/10 without evidence of an infection.   ?-ID consulted and evaluated patient as well.  Pain reasonably well controlled with oral Dilaudid and Robaxin ? ? ?Peripheral vascular disease, dry gangrene of the right transtibial amputation site-poor healing ?- [name] was consulted and patient is status post right AKA on 3/22. ? ?Chronic pacemaker infection, possible endocarditis on anterior mitral leaflet ?--seen by ID as an outpatient, currently he is on suppressive therapy with doxycycline and amoxicillin indefinitely.  ? ? ?Left metatarsal wound- ?-poor healing, AKA not possible due to arterial graft. ?- he is on suppressive antibiotics at baseline ? ? ?ESRD-continue HD as an outpatient  ?-Currently on TTS schedule however going to SNF and may need to adjust HD schedule for transportation. ? ? ?Severe TR with flail tricuspid valve leaflet- ?-noted, volume status managed by dialysis ? ? ?Chronic systolic CHF- ?- 2D echo done showed EF of 30-35%, global hypokinesis of the LV.  RV was normal.  PA pressure was moderately  elevated.   ?-Currently appears euvolemic, volume management per HD ? ? ?History of sinus node dysfunction and complete heart block- ?-status post pacemaker 2015.  Concern for pacemaker infection on suppressive antibiotics as above ? ?Coronary artery disease ?-with history of PCI, followed by Dr. [name].  ? ? ?Paroxysmal A-fib- ?-continue Lopressor, Eliquis, seen by cardiology dismission. ? ?Essential hypertension- ?-controlled ? ? ?Hyperlipidemia- ?-continue statin ? ? ?Anemia of chronic renal disease- ?-no bleeding, continue to monitor ? ? ?Disposition-extremely deconditioned, PT recommends SNF.   ? ? ?Type 2 diabetes mellitus- ?-Continue insulin regimen ? ?Pressure Injury Documentation: ?Pressure Injury 01/14/22 Ischial tuberosity Right Stage 3 -  Full thickness tissue loss.  Subcutaneous fat may be visible but bone, tendon or muscle are NOT exposed. (Active)  ?01/14/22 0321  ?Location: Ischial tuberosity  ?Location Orientation: Right  ?Staging: Stage 3 -  Full thickness tissue loss. Subcutaneous fat may be visible but bone, tendon or muscle are NOT exposed.  ?Wound Description (Comments):   ?Present on Admission: Yes  ?Dressing Type Foam - Lift dressing to assess site every shift 02/13/22 2000  ?   ?Pressure Injury 01/14/22 Buttocks Right Stage 2 -  Partial thickness loss of dermis presenting as a shallow open injury with a red, pink wound bed without slough. (Active)  ?01/14/22 0322  ?Location: Buttocks  ?Location Orientation: Right  ?Staging: Stage 2 -  Partial thickness loss of dermis presenting as a shallow open injury with a red, pink wound bed without slough.  ?Wound Description (Comments):   ?Present on Admission: Yes  ?Dressing Type Foam - Lift dressing to assess site every shift 02/13/22 2000  ?   ?Pressure Injury 01/14/22 Buttocks Posterior;Proximal;Right Stage 2 -  Partial thickness loss of dermis presenting as a shallow open injury with a red, pink wound bed without slough. (Active)  ?01/14/22 0324  ?Location: Buttocks  ?Location Orientation: Posterior;Proximal;Right  ?Staging: Stage 2 -  Partial thickness loss of dermis presenting as a shallow open injury with a red, pink wound bed without slough.  ?Wound Description (Comments):   ?Present on Admission: Yes  ?Dressing Type Foam - Lift dressing to assess site every shift 02/10/22 2100  ? ? ?Code Status: Full CODE STATUS ?DVT Prophylaxis:  SCD's Start: 01/31/22 1523 ?apixaban (ELIQUIS) tablet 5 mg  ? ?Level of Care: Level of care: Med-Surg ? ?Disposition Plan:     Status is: Inpatient ? ?DC summary done on 4/4 by Dr. Cruzita Lederer, [place].  No changes.  Plan for discharge today. ? ?Procedures:  ?Hemodialysis ? ?Consultants:   ?Nephrology, cardiology, orthopedics, palliative medicine ? ?Antimicrobials:  ? ?Anti-infectives (From  admission, onward)  ? ? Start     Dose/Rate Route Frequency Ordered Stop  ? 01/31/22 1130  clindamycin (CLEOCIN) IVPB 900 mg       ? 900 mg ?100 mL/hr over 30 Minutes Intravenous On call to O.R. 01/31/22 1122 01/31/22 1334  ? 01/31/22 1056  ceFAZolin (ANCEF) 2-4 GM/100ML-% IVPB  Status:  Discontinued       ?Note to Pharmacy: Gregery Na H: cabinet override  ?    01/31/22 1056 01/31/22 1157  ? 01/22/22 2200  amoxicillin (AMOXIL) capsule 500 mg       ? 500 mg Oral Every 24 hours 01/22/22 1057    ? 01/21/22 2200  amoxicillin (AMOXIL) capsule 500 mg  Status:  Discontinued       ? 500 mg Oral Every 12 hours 01/21/22 1357 01/22/22 1057  ? 01/19/22 2000  doxycycline (VIBRA-TABS)  tablet 100 mg       ? 100 mg Oral Every 12 hours 01/19/22 1523    ? 01/19/22 2000  amoxicillin (AMOXIL) capsule 500 mg  Status:  Discontinued       ? 500 mg Oral Daily 01/19/22 1523 01/21/22 1357  ? 01/19/22 1444  vancomycin (VANCOCIN) 1-5 GM/200ML-% IVPB       ?Note to Pharmacy: Desiree Hane H: cabinet override  ?    01/19/22 1444 01/20/22 0259  ? ?  ? ? ? ? ? ?Medications ? ?Scheduled Meds: ? (feeding supplement) PROSource Plus  30 mL Oral BID BM  ? amoxicillin  500 mg Oral Q24H  ? apixaban  5 mg Oral BID  ? vitamin C  1,000 mg Oral Daily  ? atorvastatin  20 mg Oral QHS  ? calcitRIOL  0.25 mcg Oral Daily  ? Chlorhexidine Gluconate Cloth  6 each Topical Q0600  ? darbepoetin (ARANESP) injection - DIALYSIS  100 mcg Intravenous Q Thu-HD  ? docusate sodium  100 mg Oral Daily  ? doxycycline  100 mg Oral Q12H  ? feeding supplement (NEPRO CARB STEADY)  237 mL Oral BID BM  ? furosemide  80 mg Oral Once per day on Sun Mon Wed Fri  ? gabapentin  200 mg Oral QHS  ? insulin aspart  0-6 Units Subcutaneous TID WC  ? insulin aspart protamine- aspart  10 Units Subcutaneous BID WC  ? lidocaine  1 patch Transdermal Q24H  ? metoprolol tartrate  25 mg Oral BID  ? multivitamin with minerals  1 tablet Oral Once per day on Mon Thu  ? nutrition supplement (JUVEN)   1 packet Oral BID BM  ? pantoprazole  40 mg Oral Daily  ? sodium chloride flush  3 mL Intravenous Q12H  ? ?Continuous Infusions: ? sodium chloride    ? sodium chloride    ? sodium chloride    ? magnesium sulfate

## 2022-02-14 NOTE — Progress Notes (Signed)
Occupational Therapy Treatment ?Patient Details ?Name: Alexander [name] ?MRN: [id number] ?DOB: 1961/09/10 ?Today's Date: 02/14/2022 ? ? ?History of present illness The pt is a 61 yo male presenting 3/4 with chronic back pain and weakness to LLE in addition to frequent falls (x2 on day of admission). Pt found to have severe L2-L3 stenosis but surgery was too high risk. Now s/p R AKA on 3/22. PMH: ESRD on HD TTS, paroxysmal atrial fibrillation on Eliquis, pacemaker, CAD s/p stent, diastolic CHF, PVD with right BKA and left midfoot amputation, history of GI bleed, and DM II. ?  ?OT comments ? Pt making incremental progress towards OT goals, remains limited by high reports of pain inconsistent with observable facial expressions. Pt progressing sitting balance, bed mobility and UB ADLs though slower progress noted towards LB ADLs and functional transfers; OT goals updated and extended accordingly. Emphasis on UB ADLs EOB, lateral leans and elbow WB for sliding board placement and scoot transfer attempts. Continue to rec SNF rehab as pt below PLOF and at increased risk for falls.  ? ?Recommendations for follow up therapy are one component of a multi-disciplinary discharge planning process, led by the attending physician.  Recommendations may be updated based on patient status, additional functional criteria and insurance authorization. ?   ?Follow Up Recommendations ? Skilled nursing-short term rehab (<3 hours/day)  ?  ?Assistance Recommended at Discharge Frequent or constant Supervision/Assistance  ?Patient can return home with the following ? Two people to help with walking and/or transfers;A lot of help with bathing/dressing/bathroom ?  ?Equipment Recommendations ? Tub/shower bench  ?  ?Recommendations for Other Services   ? ?  ?Precautions / Restrictions Precautions ?Precautions: Fall;Other (comment) ?Precaution Comments: new Rt AKA, hx of left midfoot amputation,  also with new lumbar stenosis ?Restrictions ?Weight Bearing  Restrictions: Yes ?RLE Weight Bearing: Non weight bearing  ? ? ?  ? ?Mobility Bed Mobility ?Overal bed mobility: Needs Assistance ?Bed Mobility: Sidelying to Sit, Sit to Supine ?  ?Sidelying to sit: Mod assist, +2 for safety/equipment ?  ?  ?Sit to sidelying: Min assist ?General bed mobility comments: Initially Mod A x 2 to lift trunk EOB with cues for hand placement, progressing to min guard to lift trunk pushing from elbow during sidelying. Min A to get L LE back into bed ?  ? ?Transfers ?Overall transfer level: Needs assistance ?  ?  ?  ?  ?  ?  ?  ?  ?General transfer comment: practiced scooting along bedside with pushing through B UE vs UE and bedrail support at Max A x 2 ?  ?  ?Balance Overall balance assessment: Needs assistance ?Sitting-balance support: Feet supported, Bilateral upper extremity supported ?Sitting balance-Leahy Scale: Fair ?Sitting balance - Comments: Able to sit without UE support on EOB. Prefers UE support ?  ?  ?  ?  ?  ?  ?  ?  ?  ?  ?  ?  ?  ?  ?  ?   ? ?ADL either performed or assessed with clinical judgement  ? ?ADL Overall ADL's : Needs assistance/impaired ?  ?  ?Grooming: Supervision/safety;Sitting;Wash/dry face ?Grooming Details (indicate cue type and reason): mild LOB washing face (eyes closed) but able to correct ?Upper Body Bathing: Minimal assistance;Sitting ?Upper Body Bathing Details (indicate cue type and reason): assist for back sitting EOB ?  ?  ?  ?  ?  ?  ?  ?  ?  ?  ?  ?  ?  ?  General ADL Comments: Emphasis on lateral leans in prep for sliding board transfers with elbow WB and core stability exercises. ?  ? ?Extremity/Trunk Assessment Upper Extremity Assessment ?Upper Extremity Assessment: Overall WFL for tasks assessed ?  ?Lower Extremity Assessment ?Lower Extremity Assessment: Defer to PT evaluation ?  ?  ?  ? ?Vision   ?Vision Assessment?: No apparent visual deficits ?  ?Perception   ?  ?Praxis   ?  ? ?Cognition Arousal/Alertness: Awake/alert ?Behavior During Therapy:  [facility] for tasks assessed/performed ?Overall Cognitive Status: Within Functional Limits for tasks assessed ?  ?  ?  ?  ?  ?  ?  ?  ?  ?  ?  ?  ?  ?  ?  ?  ?General Comments: self limiting, perseverating on pain (calm affect but reports 10/10 pain) ?  ?  ?   ?Exercises   ? ?  ?Shoulder Instructions   ? ? ?  ?General Comments    ? ? ?Pertinent Vitals/ Pain       Pain Assessment ?Pain Assessment: 0-10 ?Pain Score:  (11) ?Pain Location: back ?Pain Descriptors / Indicators: Grimacing, Guarding ?Pain Intervention(s): Monitored during session, Limited activity within patient's tolerance, Premedicated before session ? ?Home Living   ?  ?  ?  ?  ?  ?  ?  ?  ?  ?  ?  ?  ?  ?  ?  ?  ?  ?  ? ?  ?Prior Functioning/Environment    ?  ?  ?  ?   ? ?Frequency ? Min 2X/week  ? ? ? ? ?  ?Progress Toward Goals ? ?OT Goals(current goals can now be found in the care plan section) ? Progress towards OT goals: Progressing toward goals ? ?Acute Rehab OT Goals ?Patient Stated Goal: better pain meds ?OT Goal Formulation: With patient ?Time For Goal Achievement: 03/01/22 ?Potential to Achieve Goals: Good ?ADL Goals ?Pt Will Perform Grooming: sitting;with set-up ?Pt Will Perform Upper Body Bathing: with min guard assist;sitting ?Pt Will Perform Lower Body Bathing: with min assist ?Pt Will Perform Upper Body Dressing: with min guard assist;sitting ?Pt Will Perform Lower Body Dressing: with mod assist ?Pt Will Transfer to Toilet: with mod assist;anterior/posterior transfer;with transfer board;bedside commode ?Pt Will Perform Toileting - Clothing Manipulation and hygiene: with mod assist;sitting/lateral leans  ?Plan Discharge plan remains appropriate   ? ?Co-evaluation ? ? ? PT/OT/SLP Co-Evaluation/Treatment: Yes ?Reason for Co-Treatment: To address functional/ADL transfers ?  ?OT goals addressed during session: ADL's and self-care;Strengthening/ROM ?  ? ?  ?AM-PAC OT "6 Clicks" Daily Activity     ?Outcome Measure ? ? Help from another person eating  meals?: None ?Help from another person taking care of personal grooming?: A Little ?Help from another person toileting, which includes using toliet, bedpan, or urinal?: Total ?Help from another person bathing (including washing, rinsing, drying)?: Total ?Help from another person to put on and taking off regular upper body clothing?: A Little ?Help from another person to put on and taking off regular lower body clothing?: Total ?6 Click Score: 13 ? ?  ?End of Session   ? ?OT Visit Diagnosis: Muscle weakness (generalized) (M62.81);Pain ?Pain - part of body:  (back) ?  ?Activity Tolerance Patient tolerated treatment well ?  ?Patient Left in bed;with call bell/phone within reach;with bed alarm set ?  ?Nurse Communication   ?  ? ?   ? ?Time: 4665-9935 ?OT Time Calculation (min): 27 min ? ?Charges: OT General Charges ?$  OT Visit: 1 Visit ?OT Treatments ?$Self Care/Home Management : 8-22 mins ? ?Malachy Chamber, OTR/L ?Acute Rehab Services ?Office: [phone]  ? ?[name] ?02/14/2022, 11:09 AM ?

## 2022-02-14 NOTE — Progress Notes (Signed)
Contacted by CSW this am regarding snf request for MWF schedule for out-pt HD. Spoke to [place] at [facility] and clinic can do MWF 10:30 arrival for 11:00 chair. Info provided to CSW who provided info to snf. Per CSW, snf is able to accommodate MWF 10:30 arrival/11:00 chair time. Contacted Renee at [facility] to make her aware that snf agreeable to that schedule and that pt will d/c today. Pt will start new MWF schedule on Friday. Renee agreeable to plan. Update provided to nephrologist and renal NP. Plan is for pt to receive a short treatment today and then d/c to snf. Will add out-pt HD schedule to AVS.  ? ?Melven Sartorius ?Renal Navigator ?[phone] ?

## 2022-02-14 NOTE — Progress Notes (Signed)
?[facility] ?Progress Note  ? ?Subjective:    ?Seen and examined patient at bedside. No complaints/concerns. Tolerated yesterday's HD with net UF 1.2L. Plan for dc today to SNF and HD schedule now changed to MWF. Plan for HD again today for 2hrs then she will resume HD on Friday 4/7 in outpatient. ? ?Objective ?Vitals:  ? 02/14/22 1344 02/14/22 1353 02/14/22 1400 02/14/22 1430  ?BP: 117/65 (!) 125/59 116/61 (!) 103/54  ?Pulse: (!) 109 95 96 78  ?Resp: 16     ?Temp: (!) 97.4 ?F (36.3 ?C)     ?TempSrc: Temporal     ?SpO2: 95%     ?Weight: 81.7 kg     ?Height:      ? ?Physical Exam ?General: Awake and alert; NAD ?Heart: Normal S1 and S2; No murmurs, gallops, or rubs ?Lungs: Clear throughout; No wheezing, rales, or rhonchi ?Abdomen: Soft and non-tender ?Extremities: No edema R thigh or LLE; R BKA; LLE wrapped ?Dialysis Access: RUE AVF (+) B/T  ? ?Filed Weights  ? 02/13/22 4696 02/13/22 1203 02/14/22 1344  ?Weight: 82.4 kg 81 kg 81.7 kg  ? ? ?Intake/Output Summary (Last 24 hours) at 02/14/2022 1501 ?Last data filed at 02/14/2022 0900 ?Gross per 24 hour  ?Intake 680 ml  ?Output 400 ml  ?Net 280 ml  ? ? ?Additional Objective ?Labs: ?Basic Metabolic Panel: ?Recent Labs  ?Lab 02/08/22 ?0801 02/13/22 ?0403  ?NA 134* 135  ?K 4.0 4.0  ?CL 97* 99  ?CO2 26 24  ?GLUCOSE 165* 144*  ?BUN 42* 40*  ?CREATININE 3.22* 4.77*  ?CALCIUM 8.6* 8.9  ?PHOS 4.3 4.8*  ? ?Liver Function Tests: ?Recent Labs  ?Lab 02/08/22 ?0801 02/13/22 ?0403  ?ALBUMIN 2.2* 2.3*  ? ?No results for input(s): LIPASE, AMYLASE in the last 168 hours. ?CBC: ?Recent Labs  ?Lab 02/08/22 ?0802 02/13/22 ?0403  ?WBC 10.5 10.9*  ?NEUTROABS  --  6.6  ?HGB 10.1* 10.6*  ?HCT 32.5* 33.6*  ?MCV 95.0 94.6  ?PLT 192 135*  ? ?Blood Culture ?   ?Component Value Date/Time  ? SDES WOUND 01/19/2022 1452  ? SPECREQUEST L3/L4 [place] ASPIRATION 01/19/2022 1452  ? CULT  01/19/2022 1452  ?  No growth aerobically or anaerobically. ?Performed at [facility], [place] [address], [place], [place] [address] ?  ? REPTSTATUS 01/24/2022 FINAL 01/19/2022 1452  ? ? ?Cardiac Enzymes: ?No results for input(s): CKTOTAL, CKMB, CKMBINDEX, TROPONINI in the last 168 hours. ?CBG: ?Recent Labs  ?Lab 02/13/22 ?1305 02/13/22 ?1547 02/13/22 ?2037 02/14/22 ?4132 02/14/22 ?1107  ?GLUCAP 133* 154* 99 118* 110*  ? ?Iron Studies: No results for input(s): IRON, TIBC, TRANSFERRIN, FERRITIN in the last 72 hours. ?Lab Results  ?Component Value Date  ? INR 1.4 (H) 01/18/2022  ? INR 1.9 (H) 01/17/2022  ? INR 3.0 (H) 10/30/2021  ? ?Studies/Results: ?No results found. ? ?Medications: ? sodium chloride    ? sodium chloride    ? sodium chloride    ? magnesium sulfate bolus IVPB    ? ? (feeding supplement) PROSource Plus  30 mL Oral BID BM  ? amoxicillin  500 mg Oral Q24H  ? apixaban  5 mg Oral BID  ? vitamin C  1,000 mg Oral Daily  ? atorvastatin  20 mg Oral QHS  ? calcitRIOL  0.25 mcg Oral Daily  ? Chlorhexidine Gluconate Cloth  6 each Topical Q0600  ? darbepoetin (ARANESP) injection - DIALYSIS  100 mcg Intravenous Q Thu-HD  ? docusate sodium  100 mg Oral Daily  ? doxycycline  100 mg Oral Q12H  ? feeding supplement (NEPRO CARB STEADY)  237 mL Oral BID BM  ? furosemide  80 mg Oral Once per day on Sun Mon Wed Fri  ? gabapentin  200 mg Oral QHS  ? insulin aspart  0-6 Units Subcutaneous TID WC  ? insulin aspart protamine- aspart  10 Units Subcutaneous BID WC  ? lidocaine  1 patch Transdermal Q24H  ? metoprolol tartrate  25 mg Oral BID  ? multivitamin with minerals  1 tablet Oral Once per day on Mon Thu  ? nutrition supplement (JUVEN)  1 packet Oral BID BM  ? pantoprazole  40 mg Oral Daily  ? sodium chloride flush  3 mL Intravenous Q12H  ? ? ?Dialysis Orders: ?TTS - GOC ?4hrs66mn, BFR 400, DFR 800,  EDW 94kg, 3K/ 2.5Ca ?-No Heparin ?-Mircera 200 mcg IV q 2wks - last 01/11/22 ?-Calcitriol 0.27m PO TIW ? ?Assessment/Plan: ?1 Lumbar back pain/Radiculopathy: Followed by Spine Surgery Dr. CrSaintclair HalstedMR Lumbar Spine completed: showed  fractures L2-L3 with fluid-? Trauma vs. Infection. Neurosurgeon canceled surgery secondary to high risk. L2-3 disc aspiration 01/19/2022 culture negative.Continue pain management and Pt/OT per primary ?2. PVD with Dry gangrene:R AKA on Wednesday 01/31/2022 per Dr. DuSharol Given ?3. ESRD - on HD TTS. Tolerated dialysis in the chair previously. Informed by HD RN of patient needing hoyer lift to place in chair. Short HD today d/t HD scheduled now changed to MWF. ?4. Hypertension/volume  -BP & Volume are relatively stable. Lower EDW on discharge. UF as tolerated.  ?5. Anemia of CKD - Hgb now 10.6. Given Aranesp 100 mcg IV 02/01/2022. Follow HGB. Transfuse PRN.  ? 6. Secondary Hyperparathyroidism - PO4 and calcium at goal. Continue VDRA. ?7. Nutrition - Renal diet on fluid restriction. Albumin is low. On protein supps, renal vitamins  ?8. DMT2-per primary ?9. 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 On ABX. Follows with cardiology.  ?10.Dispo-Plan to dc to SNF for short-term rehab. HD scheduled now changed to MWF. Patient will receive short HD today and can resume HD on Friday 4/7 in outpatient. ? ?CoTobie PoetNP ?[facility] ?02/14/2022,3:01 PM ? LOS: 32 days  ?  ?

## 2022-02-14 NOTE — TOC Progression Note (Signed)
Transition of Care (TOC) - Initial/Assessment Note  ? ? ?Patient Details  ?Name: Alexander [name] ?MRN: [id number] ?Date of Birth: December 27, 1960 ? ?Transition of Care (TOC) CM/SW Contact:    ?Paulene Floor Lelaina Oatis, LCSWA ?Phone Number: ?02/14/2022, 12:12 PM ? ?Clinical Narrative:                 ?CSW was contacted by [facility] and informed that the facility is unable to transport the patient to dialysis on TTS and asked that the patient be switched to a MWF schedule.   ? ?CSW contacted the renal navigator and was informed that the facility has an opening on MWF at 11am.  The facility was notified and approved.   ? ?The patient will need to receive a short dialysis treatment today and can d/c to the facility afterwards.  Tina with admissions at [facility] notified.   ? ?Expected Discharge Plan: Home/Self Care ?Barriers to Discharge: Continued Medical Work up ? ? ?Patient Goals and CMS Choice ?Patient states their goals for this hospitalization and ongoing recovery are:: Pt states he would like to return home after hospitalization. ?CMS Medicare.gov Compare Post Acute Care list provided to:: Patient ?Choice offered to / list presented to : Patient ? ?Expected Discharge Plan and Services ?Expected Discharge Plan: Home/Self Care ?  ?  ?Post Acute Care Choice: Resumption of Svcs/PTA Provider ?Living arrangements for the past 2 months: [place] ?                ?  ?  ?  ?  ?  ?  ?  ?  ?  ?  ? ?Prior Living Arrangements/Services ?Living arrangements for the past 2 months: [place] ?Lives with:: Adult Children, Spouse ?Patient language and need for interpreter reviewed:: Yes ?Do you feel safe going back to the place where you live?: Yes      ?Need for Family Participation in Patient Care: Yes (Comment) ?Care giver support system in place?: Yes (comment) ?Current home services: DME ?Criminal Activity/Legal Involvement Pertinent to Current Situation/Hospitalization: No - Comment as needed ? ?Activities of Daily  Living ?Home Assistive Devices/Equipment: CBG Meter ?ADL Screening (condition at time of admission) ?Patient's cognitive ability adequate to safely complete daily activities?: Yes ?Is the patient deaf or have difficulty hearing?: No ?Does the patient have difficulty seeing, even when wearing glasses/contacts?: No ?Does the patient have difficulty concentrating, remembering, or making decisions?: No ?Patient able to express need for assistance with ADLs?: Yes ?Does the patient have difficulty dressing or bathing?: Yes ?Independently performs ADLs?: No ?Communication: Independent ?Dressing (OT): Needs assistance ?Is this a change from baseline?: Pre-admission baseline ?Grooming: Independent ?Feeding: Independent ?Bathing: Needs assistance ?Is this a change from baseline?: Change from baseline, expected to last >3 days ?Toileting: Needs assistance ?Is this a change from baseline?: Pre-admission baseline ?In/Out Bed: Needs assistance ?Is this a change from baseline?: Pre-admission baseline ?Does the patient have difficulty walking or climbing stairs?: Yes ?Weakness of Legs: Both ?Weakness of Arms/Hands: Both ? ?Permission Sought/Granted ?Permission sought to share information with : Family Supports ?Permission granted to share information with : No ?   ?   ?   ?   ? ?Emotional Assessment ?Appearance:: Appears stated age ?Attitude/Demeanor/Rapport: Engaged ?Affect (typically observed): Appropriate ?Orientation: : Oriented to Self, Oriented to Place, Oriented to  Time, Oriented to Situation ?Alcohol / Substance Use: Not Applicable ?Psych Involvement: No (comment) ? ?Admission diagnosis:  Intractable back pain [M54.9] ?Patient Active Problem List  ? Diagnosis  Date Noted  ? Goals of care, counseling/discussion   ? Palliative care by specialist   ? Dehiscence of amputation stump of right lower extremity ([place])   ? Endocarditis 01/21/2022  ? Ulcer of right leg, limited to breakdown of skin ([place])   ? Below-knee amputation of  right lower extremity ([place])   ? Ulcer of left foot, limited to breakdown of skin ([place])   ? History of transmetatarsal amputation of left foot ([place])   ? Lumbar back pain with radiculopathy affecting left lower extremity 01/14/2022  ? Dyslipidemia 01/14/2022  ? Type 2 diabetes mellitus with chronic kidney disease, with long-term current use of insulin ([place]) 01/14/2022  ? GERD without esophagitis 01/14/2022  ? Pressure injury of skin 01/14/2022  ? Intractable back pain 01/13/2022  ? Blister 11/08/2021  ? Severe protein-calorie malnutrition ([place]) 11/02/2021  ? Pacemaker infection ([place])   ? Transaminitis   ? Tricuspid valve vegetation   ? Thrombocytopenia ([place]) 10/26/2021  ? Hyponatremia 10/26/2021  ? Acute on chronic diastolic CHF (congestive heart failure) ([place]) 10/26/2021  ? Atrial fibrillation with RVR ([place]) 10/25/2021  ? Symptomatic anemia 09/30/2021  ? ESRD on dialysis [facility]) 09/30/2021  ? PAF (paroxysmal atrial fibrillation) ([place]) 09/30/2021  ? NSVT (nonsustained ventricular tachycardia) ([place])   ? Pancytopenia ([place])   ? Acute blood loss anemia 07/18/2020  ? Hemodialysis patient ([place]) 06/04/2020  ? Necrotizing glomerulonephritis 06/04/2020  ? Heme positive stool   ? Melena   ? Acute GI bleeding 05/23/2020  ? Acute hematogenous osteomyelitis of right foot ([place]) 03/11/2020  ? Gangrene of right foot ([place])   ? Diabetic foot ulcer associated with type 2 diabetes mellitus ([place]) 02/19/2020  ? Heel ulcer ([place]) 11/05/2019  ? Gangrene of extremity due to atherosclerosis of artery ([place]) 09/09/2017  ? Essential hypertension, benign 02/07/2015  ? Renal insufficiency 02/07/2015  ? Post PTCA 11/09/2014  ? S/P PTCA (percutaneous transluminal coronary angioplasty) 11/09/2014  ? Pacemaker: [place] DR MRI [id number] Dual chamber pacemaker 10/08/2014 10/08/2014  ? Encounter for care of pacemaker 10/08/2014  ? Sinus node dysfunction ([place]) 10/08/2014  ? Cardiac asystole ([place]) 10/07/2014  ? Cardiac syncope 10/07/2014  ? Diabetes  mellitus with stage 3 chronic kidney disease ([place]) 10/07/2014  ? CAD (coronary artery disease), native coronary artery 10/07/2014  ? Diabetes mellitus due to underlying condition without complications ([place]) 61/60/7371  ? Anemia, unspecified 05/25/2014  ? Syncope 04/30/2014  ? Atherosclerosis of native arteries of the extremities with gangrene ([place]) 12/16/2013  ? Acute renal failure ([place]) 10/28/2013  ? PVD (peripheral vascular disease) ([place]) 10/27/2013  ? Leukocytosis 10/21/2013  ? Anemia 10/21/2013  ? Osteomyelitis of left foot ([place]) 08/19/2013  ? Spinal stenosis, lumbar region, with neurogenic claudication 06/12/2012  ?  Class: Diagnosis of  ? Diabetes mellitus with end stage renal disease ([place]) 03/15/2009  ? ALCOHOL ABUSE 03/15/2009  ? TOBACCO USE 03/15/2009  ? HYPERTENSION 03/15/2009  ? ?PCP:  [name], MD ?Pharmacy:   ?[facility], [place] ?[place] ?[place] [address] ?Phone: [phone] Fax: [phone] ? ?[id number] [place],  - [address] [place]?Phone: [phone]: [phone]? ?[facility] ?[address] [place]?Phone: [phone]: [phone] ? ? ? ? ?Social Determinants of Health (SDOH) Interventions ?  ? ?Readmission Risk Interventions ? ?  10/27/2021  ?  3:51 PM 05/26/2020  ?  1:00  PM 03/15/2020  ?  4:52 PM  ?Readmission Risk Prevention Plan  ?Transportation Screening Complete Complete Complete  ?PCP or Specialist Appt within 5-7 Days   Complete  ?PCP or Specialist Appt within 3-5 Days Complete    ?Home Care Screening   Complete  ?Medication Review (RN CM)   Complete  ?[place] or Home Care Consult Complete    ?Social Work Consult for [place] Planning/Counseling Complete    ?Palliative Care Screening Not Applicable    ?Medication Review Press photographer) Complete Complete   ?PCP or Specialist appointment  within 3-5 days of discharge  Complete   ?[place] or Home Care Consult  Complete   ?SW Recovery Care/Counseling Consult  Complete   ?Palliative Care Screening  Not Applicable   ?[place]  Not Applicable   ? ?

## 2022-02-14 NOTE — TOC Transition Note (Signed)
Transition of Care (TOC) - CM/SW Discharge Note ? ? ?Patient Details  ?Name: Alexander [name] ?MRN: [id number] ?Date of Birth: 1961-08-22 ? ?Transition of Care (TOC) CM/SW Contact:  ?Paulene Floor Camren Henthorn, LCSWA ?Phone Number: ?02/14/2022, 1:43 PM ? ? ?Clinical Narrative:    ?Patient will DC to: [facility] ?Anticipated DC date:  02/14/2022 ?Transport by: Corey Harold ? ? ?Per MD patient ready for DC to SNF. RN to call report prior to discharge [phone] room 144. RN, patient and facility notified of DC. Discharge Summary and FL2 sent to facility. DC packet on chart. Ambulance transport will be requested for patient after dialysis treatment is complete.  ? ?CSW will sign off for now as social work intervention is no longer needed. Please consult [place] again if new needs arise. ?  ? ? ?Final next level of care: [place] ?Barriers to Discharge: Barriers Resolved ? ? ?Patient Goals and CMS Choice ?Patient states their goals for this hospitalization and ongoing recovery are:: Pt states he would like to return home after hospitalization. ?CMS Medicare.gov Compare Post Acute Care list provided to:: Patient ?Choice offered to / list presented to : Patient ? ?Discharge Placement ?  ?           ?  ?Patient to be transferred to facility by: [facility] ?Name of family member notified: patient alert and oriented ?Patient and family notified of of transfer: 02/14/22 ? ?Discharge Plan and Services ?  ?  ?Post Acute Care Choice: Resumption of Svcs/PTA Provider          ?  ?  ?  ?  ?  ?  ?  ?  ?  ?  ? ?Social Determinants of Health (SDOH) Interventions ?  ? ? ?Readmission Risk Interventions ? ?  10/27/2021  ?  3:51 PM 05/26/2020  ?  1:00 PM 03/15/2020  ?  4:52 PM  ?Readmission Risk Prevention Plan  ?Transportation Screening Complete Complete Complete  ?PCP or Specialist Appt within 5-7 Days   Complete  ?PCP or Specialist Appt within 3-5 Days Complete    ?Home Care Screening   Complete  ?Medication Review (RN CM)   Complete  ?[place] or Home Care  Consult Complete    ?Social Work Consult for [place] Planning/Counseling Complete    ?Palliative Care Screening Not Applicable    ?Medication Review Press photographer) Complete Complete   ?PCP or Specialist appointment within 3-5 days of discharge  Complete   ?[place] or Home Care Consult  Complete   ?SW Recovery Care/Counseling Consult  Complete   ?Palliative Care Screening  Not Applicable   ?[place]  Not Applicable   ? ? ? ? ? ?

## 2022-02-15 NOTE — Progress Notes (Signed)
DISCHARGE NOTE SNF [facility]  to be discharged  to [facility] per MD order. Patient verbalized understanding. ? ?Skin clean, dry and intact without evidence of skin break down, no evidence of skin tears noted. IV catheter discontinued intact. Site without signs and symptoms of complications. Dressing and pressure applied. Pt denies pain at the site currently. No complaints noted. ? ?Patient free of lines, drains, and wounds. ? ?Discharge packet assembled. An After Visit Summary (AVS) was printed and given to the EMS personnel. Patient escorted via stretcher and discharged to [facility] via ambulance. Report called to accepting facility; all questions and concerns addressed. ? ?Rockie Neighbours RN  ?

## 2022-02-16 DIAGNOSIS — E0821 Diabetes mellitus due to underlying condition with diabetic nephropathy: Secondary | ICD-10-CM | POA: Diagnosis not present

## 2022-02-16 DIAGNOSIS — N186 End stage renal disease: Secondary | ICD-10-CM | POA: Diagnosis not present

## 2022-02-16 DIAGNOSIS — D509 Iron deficiency anemia, unspecified: Secondary | ICD-10-CM | POA: Diagnosis not present

## 2022-02-16 DIAGNOSIS — N2581 Secondary hyperparathyroidism of renal origin: Secondary | ICD-10-CM | POA: Diagnosis not present

## 2022-02-16 DIAGNOSIS — D631 Anemia in chronic kidney disease: Secondary | ICD-10-CM | POA: Diagnosis not present

## 2022-02-16 DIAGNOSIS — Z992 Dependence on renal dialysis: Secondary | ICD-10-CM | POA: Diagnosis not present

## 2022-02-16 LAB — FUNGUS CULTURE WITH STAIN

## 2022-02-16 LAB — FUNGUS CULTURE RESULT

## 2022-02-16 LAB — FUNGAL ORGANISM REFLEX

## 2022-02-19 DIAGNOSIS — N186 End stage renal disease: Secondary | ICD-10-CM | POA: Diagnosis not present

## 2022-02-19 DIAGNOSIS — E0821 Diabetes mellitus due to underlying condition with diabetic nephropathy: Secondary | ICD-10-CM | POA: Diagnosis not present

## 2022-02-19 DIAGNOSIS — Z992 Dependence on renal dialysis: Secondary | ICD-10-CM | POA: Diagnosis not present

## 2022-02-19 DIAGNOSIS — D631 Anemia in chronic kidney disease: Secondary | ICD-10-CM | POA: Diagnosis not present

## 2022-02-19 DIAGNOSIS — N2581 Secondary hyperparathyroidism of renal origin: Secondary | ICD-10-CM | POA: Diagnosis not present

## 2022-02-19 DIAGNOSIS — D509 Iron deficiency anemia, unspecified: Secondary | ICD-10-CM | POA: Diagnosis not present

## 2022-02-21 DIAGNOSIS — E0821 Diabetes mellitus due to underlying condition with diabetic nephropathy: Secondary | ICD-10-CM | POA: Diagnosis not present

## 2022-02-21 DIAGNOSIS — N186 End stage renal disease: Secondary | ICD-10-CM | POA: Diagnosis not present

## 2022-02-21 DIAGNOSIS — D509 Iron deficiency anemia, unspecified: Secondary | ICD-10-CM | POA: Diagnosis not present

## 2022-02-21 DIAGNOSIS — D631 Anemia in chronic kidney disease: Secondary | ICD-10-CM | POA: Diagnosis not present

## 2022-02-21 DIAGNOSIS — N2581 Secondary hyperparathyroidism of renal origin: Secondary | ICD-10-CM | POA: Diagnosis not present

## 2022-02-21 DIAGNOSIS — Z992 Dependence on renal dialysis: Secondary | ICD-10-CM | POA: Diagnosis not present

## 2022-02-23 DIAGNOSIS — Z992 Dependence on renal dialysis: Secondary | ICD-10-CM | POA: Diagnosis not present

## 2022-02-23 DIAGNOSIS — N2581 Secondary hyperparathyroidism of renal origin: Secondary | ICD-10-CM | POA: Diagnosis not present

## 2022-02-23 DIAGNOSIS — N186 End stage renal disease: Secondary | ICD-10-CM | POA: Diagnosis not present

## 2022-02-23 DIAGNOSIS — D631 Anemia in chronic kidney disease: Secondary | ICD-10-CM | POA: Diagnosis not present

## 2022-02-23 DIAGNOSIS — D509 Iron deficiency anemia, unspecified: Secondary | ICD-10-CM | POA: Diagnosis not present

## 2022-02-23 DIAGNOSIS — E0821 Diabetes mellitus due to underlying condition with diabetic nephropathy: Secondary | ICD-10-CM | POA: Diagnosis not present

## 2022-02-25 DIAGNOSIS — I480 Paroxysmal atrial fibrillation: Secondary | ICD-10-CM | POA: Diagnosis not present

## 2022-02-25 DIAGNOSIS — E11622 Type 2 diabetes mellitus with other skin ulcer: Secondary | ICD-10-CM | POA: Diagnosis not present

## 2022-02-25 DIAGNOSIS — I5022 Chronic systolic (congestive) heart failure: Secondary | ICD-10-CM | POA: Diagnosis not present

## 2022-02-25 DIAGNOSIS — I132 Hypertensive heart and chronic kidney disease with heart failure and with stage 5 chronic kidney disease, or end stage renal disease: Secondary | ICD-10-CM | POA: Diagnosis not present

## 2022-02-25 DIAGNOSIS — N186 End stage renal disease: Secondary | ICD-10-CM | POA: Diagnosis not present

## 2022-02-25 DIAGNOSIS — L98499 Non-pressure chronic ulcer of skin of other sites with unspecified severity: Secondary | ICD-10-CM | POA: Diagnosis not present

## 2022-02-25 DIAGNOSIS — E1152 Type 2 diabetes mellitus with diabetic peripheral angiopathy with gangrene: Secondary | ICD-10-CM | POA: Diagnosis not present

## 2022-02-25 DIAGNOSIS — T8781 Dehiscence of amputation stump: Secondary | ICD-10-CM | POA: Diagnosis not present

## 2022-02-25 DIAGNOSIS — E1122 Type 2 diabetes mellitus with diabetic chronic kidney disease: Secondary | ICD-10-CM | POA: Diagnosis not present

## 2022-02-25 DIAGNOSIS — E43 Unspecified severe protein-calorie malnutrition: Secondary | ICD-10-CM | POA: Diagnosis not present

## 2022-02-25 DIAGNOSIS — L89152 Pressure ulcer of sacral region, stage 2: Secondary | ICD-10-CM | POA: Diagnosis not present

## 2022-02-25 DIAGNOSIS — K219 Gastro-esophageal reflux disease without esophagitis: Secondary | ICD-10-CM | POA: Diagnosis not present

## 2022-02-25 DIAGNOSIS — D631 Anemia in chronic kidney disease: Secondary | ICD-10-CM | POA: Diagnosis not present

## 2022-02-25 DIAGNOSIS — I2510 Atherosclerotic heart disease of native coronary artery without angina pectoris: Secondary | ICD-10-CM | POA: Diagnosis not present

## 2022-02-25 DIAGNOSIS — L89153 Pressure ulcer of sacral region, stage 3: Secondary | ICD-10-CM | POA: Diagnosis not present

## 2022-02-25 DIAGNOSIS — L97821 Non-pressure chronic ulcer of other part of left lower leg limited to breakdown of skin: Secondary | ICD-10-CM | POA: Diagnosis not present

## 2022-02-25 DIAGNOSIS — E785 Hyperlipidemia, unspecified: Secondary | ICD-10-CM | POA: Diagnosis not present

## 2022-02-26 DIAGNOSIS — Z992 Dependence on renal dialysis: Secondary | ICD-10-CM | POA: Diagnosis not present

## 2022-02-26 DIAGNOSIS — N2581 Secondary hyperparathyroidism of renal origin: Secondary | ICD-10-CM | POA: Diagnosis not present

## 2022-02-26 DIAGNOSIS — N186 End stage renal disease: Secondary | ICD-10-CM | POA: Diagnosis not present

## 2022-02-26 DIAGNOSIS — D509 Iron deficiency anemia, unspecified: Secondary | ICD-10-CM | POA: Diagnosis not present

## 2022-02-26 DIAGNOSIS — D631 Anemia in chronic kidney disease: Secondary | ICD-10-CM | POA: Diagnosis not present

## 2022-02-26 DIAGNOSIS — E0821 Diabetes mellitus due to underlying condition with diabetic nephropathy: Secondary | ICD-10-CM | POA: Diagnosis not present

## 2022-02-27 ENCOUNTER — Telehealth: Payer: Self-pay | Admitting: Orthopedic Surgery

## 2022-02-27 NOTE — Telephone Encounter (Signed)
This pt is an AKA on 01/28/22 and has his first post op appt on 03/01/22 requesting refill on Oxycodone 5/325 last refill was 01/11/2022 please advise.  ?

## 2022-02-27 NOTE — Telephone Encounter (Signed)
Pt wife called and states pt needs a refill on oxycodone ?

## 2022-02-28 ENCOUNTER — Telehealth: Payer: Self-pay

## 2022-02-28 DIAGNOSIS — N186 End stage renal disease: Secondary | ICD-10-CM | POA: Diagnosis not present

## 2022-02-28 DIAGNOSIS — Z992 Dependence on renal dialysis: Secondary | ICD-10-CM | POA: Diagnosis not present

## 2022-02-28 DIAGNOSIS — D509 Iron deficiency anemia, unspecified: Secondary | ICD-10-CM | POA: Diagnosis not present

## 2022-02-28 DIAGNOSIS — N2581 Secondary hyperparathyroidism of renal origin: Secondary | ICD-10-CM | POA: Diagnosis not present

## 2022-02-28 DIAGNOSIS — D631 Anemia in chronic kidney disease: Secondary | ICD-10-CM | POA: Diagnosis not present

## 2022-02-28 DIAGNOSIS — E0821 Diabetes mellitus due to underlying condition with diabetic nephropathy: Secondary | ICD-10-CM | POA: Diagnosis not present

## 2022-02-28 MED ORDER — OXYCODONE HCL 5 MG PO CAPS: 5.0000 mg | ORAL_CAPSULE | ORAL | 0 refills | Status: DC | PRN

## 2022-02-28 NOTE — Telephone Encounter (Signed)
Prior auth submitted for the pt's Oxy IR 5 mg this was sent through cover my meds and will hold this message pending notification.  ?

## 2022-02-28 NOTE — Telephone Encounter (Signed)
Alexander [name] has been approved through cover my meds.  ?

## 2022-02-28 NOTE — Addendum Note (Signed)
Addended by: Dondra Prader R on: 02/28/2022 09:00 AM ? ? Modules accepted: Orders ? ?

## 2022-03-01 ENCOUNTER — Ambulatory Visit (INDEPENDENT_AMBULATORY_CARE_PROVIDER_SITE_OTHER): Payer: Medicare Other | Admitting: Orthopedic Surgery

## 2022-03-01 ENCOUNTER — Encounter: Payer: Self-pay | Admitting: Orthopedic Surgery

## 2022-03-01 DIAGNOSIS — I96 Gangrene, not elsewhere classified: Secondary | ICD-10-CM

## 2022-03-01 DIAGNOSIS — Z89611 Acquired absence of right leg above knee: Secondary | ICD-10-CM

## 2022-03-01 DIAGNOSIS — S78111A Complete traumatic amputation at level between right hip and knee, initial encounter: Secondary | ICD-10-CM

## 2022-03-01 DIAGNOSIS — I739 Peripheral vascular disease, unspecified: Secondary | ICD-10-CM

## 2022-03-01 DIAGNOSIS — Z89432 Acquired absence of left foot: Secondary | ICD-10-CM

## 2022-03-01 MED ORDER — OXYCODONE-ACETAMINOPHEN 5-325 MG PO TABS: 1.0000 | ORAL_TABLET | ORAL | 0 refills | Status: DC | PRN

## 2022-03-01 MED ORDER — GABAPENTIN 300 MG PO CAPS: 300.0000 mg | ORAL_CAPSULE | Freq: Three times a day (TID) | ORAL | 3 refills | Status: DC

## 2022-03-01 NOTE — Progress Notes (Signed)
? ?Office Visit Note ?  ?Patient: Alexander [name]           ?Date of Birth: 04/18/1961           ?MRN: [id number] ?Visit Date: 03/01/2022 ?             ?Requested by: [name], MD ?[address] ?[place],  [place] [address] ?PCP: [name], MD ? ?Chief Complaint  ?Patient presents with  ? Right Leg - Routine Post Op  ?  01/31/22 right AKA  ? ? ? ? ?HPI: ?Patient is a 61 year old gentleman who presents 4 weeks status post right above-the-knee amputation.  Patient is on dialysis.  Patient states he has had new ischemic changes to the entire left lower extremity.  Patient states he was supposed to be set up with a pain clinic when he left the hospital.  Patient states in the morning he has about 30 minutes of sharp pain in the right residual limb and then this pain goes away.  Patient states that the abrasions on the left leg are new. ? ?Assessment & Plan: ?Visit Diagnoses:  ?1. S/P transmetatarsal amputation of foot, left ([place])   ?2. PVD (peripheral vascular disease) ([place])   ?3. Unilateral AKA, right ([place])   ?4. Gangrene of left foot ([place])   ? ? ?Plan: We will see if we can get patient set up for pain management.  We will harvest the staples on the right above-the-knee amputation today he is given a prescription for [employer] for a K2 level prosthesis.  He will bring with him the components from his previous prosthesis so the foot and ankle could be reused.  Recommended soap and water and dry dressing changes to the left leg.  A prescription was called in for Neurontin 300 mg 3 times a day and a prescription for Percocet. ? ?Follow-Up Instructions: Return in about 2 weeks (around 03/15/2022).  ? ?Ortho Exam ? ?Patient is alert, oriented, no adenopathy, well-dressed, normal affect, normal respiratory effort. ?Patient states he has been taking Neurontin nightly 200 mg and he states this is not helping his neuropathy pain.  The right above-the-knee amputation has excellent healing the skin is intact there is no  drainage no cellulitis no wound dehiscence we will harvest the staples.  Patient has new ischemic ulcers involving the entire left lower extremity with ischemic ulcers in the posterior aspect of the calf over the dorsum of the transmetatarsal amputation as well as involving the posterior aspect of the heel.  These black gangrenous ulcers are superficial. ? ?Imaging: ?No results found. ? ? ? ? ?Labs: ?Lab Results  ?Component Value Date  ? HGBA1C 6.8 (H) 10/29/2021  ? HGBA1C 5.5 05/23/2020  ? HGBA1C 8.6 (H) 01/27/2020  ? ESRSEDRATE 25 (H) 01/17/2022  ? ESRSEDRATE 25 (H) 01/17/2022  ? ESRSEDRATE 30 (H) 01/16/2022  ? CRP 14.9 (H) 01/16/2022  ? CRP 14.0 (H) 01/15/2022  ? REPTSTATUS 01/24/2022 FINAL 01/19/2022  ? GRAMSTAIN  01/19/2022  ?  RARE WBC PRESENT,BOTH PMN AND MONONUCLEAR ?NO ORGANISMS SEEN ?  ? CULT  01/19/2022  ?  No growth aerobically or anaerobically. ?Performed at [facility], [place] [address], [place], [place] [address] ?  ? LABORGA STREPTOCOCCUS MITIS/ORALIS 01/26/2020  ? ? ? ?Lab Results  ?Component Value Date  ? ALBUMIN 2.3 (L) 02/13/2022  ? ALBUMIN 2.2 (L) 02/08/2022  ? ALBUMIN 2.1 (L) 02/06/2022  ? ? ?Lab Results  ?Component Value Date  ? MG 1.7  01/13/2022  ? MG 1.9 07/19/2020  ? MG 1.8 05/25/2020  ? ?No results found for: VD25OH ? ?No results found for: PREALBUMIN ? ?  Latest Ref Rng & Units 02/13/2022  ?  4:03 AM 02/08/2022  ?  8:02 AM 02/06/2022  ?  8:34 AM  ?CBC EXTENDED  ?WBC 4.0 - 10.5 K/uL 10.9   10.5   10.5    ?RBC 4.22 - 5.81 MIL/uL 3.55   3.42   3.50    ?Hemoglobin 13.0 - 17.0 g/dL 10.6   10.1   10.3    ?HCT 39.0 - 52.0 % 33.6   32.5   33.3    ?Platelets 150 - 400 K/uL 135   192   204    ?NEUT# 1.7 - 7.7 K/uL 6.6      ?Lymph# 0.7 - 4.0 K/uL 2.9      ? ? ? ?There is no height or weight on file to calculate BMI. ? ?Orders:  ?No orders of the defined types were placed in this encounter. ? ?Meds ordered this encounter  ?Medications  ? oxyCODONE-acetaminophen (PERCOCET/ROXICET) 5-325 MG tablet  ?   Sig: Take 1 tablet by mouth every 4 (four) hours as needed for severe pain.  ?  Dispense:  30 tablet  ?  Refill:  0  ? gabapentin (NEURONTIN) 300 MG capsule  ?  Sig: Take 1 capsule (300 mg total) by mouth 3 (three) times daily. 3 times a day when necessary neuropathy pain  ?  Dispense:  90 capsule  ?  Refill:  3  ? ? ? Procedures: ?No procedures performed ? ?Clinical Data: ?No additional findings. ? ?ROS: ? ?All other systems negative, except as noted in the HPI. ?Review of Systems ? ?Objective: ?Vital Signs: There were no vitals taken for this visit. ? ?Specialty Comments:  ?No specialty comments available. ? ?PMFS History: ?Patient Active Problem List  ? Diagnosis Date Noted  ? Goals of care, counseling/discussion   ? Palliative care by specialist   ? Dehiscence of amputation stump of right lower extremity ([place])   ? Endocarditis 01/21/2022  ? Ulcer of right leg, limited to breakdown of skin ([place])   ? Below-knee amputation of right lower extremity ([place])   ? Ulcer of left foot, limited to breakdown of skin ([place])   ? History of transmetatarsal amputation of left foot ([place])   ? Lumbar back pain with radiculopathy affecting left lower extremity 01/14/2022  ? Dyslipidemia 01/14/2022  ? Type 2 diabetes mellitus with chronic kidney disease, with long-term current use of insulin ([place]) 01/14/2022  ? GERD without esophagitis 01/14/2022  ? Pressure injury of skin 01/14/2022  ? Intractable back pain 01/13/2022  ? Blister 11/08/2021  ? Severe protein-calorie malnutrition ([place]) 11/02/2021  ? Pacemaker infection ([place])   ? Transaminitis   ? Tricuspid valve vegetation   ? Thrombocytopenia ([place]) 10/26/2021  ? Hyponatremia 10/26/2021  ? Acute on chronic diastolic CHF (congestive heart failure) ([place]) 10/26/2021  ? Atrial fibrillation with RVR ([place]) 10/25/2021  ? Symptomatic anemia 09/30/2021  ? ESRD on dialysis [facility]) 09/30/2021  ? PAF (paroxysmal atrial fibrillation) ([place]) 09/30/2021  ? NSVT (nonsustained ventricular tachycardia)  ([place])   ? Pancytopenia ([place])   ? Acute blood loss anemia 07/18/2020  ? Hemodialysis patient ([place]) 06/04/2020  ? Necrotizing glomerulonephritis 06/04/2020  ? Heme positive stool   ? Melena   ? Acute GI bleeding 05/23/2020  ? Acute hematogenous osteomyelitis of right foot ([place]) 03/11/2020  ? Gangrene of right  foot ([place])   ? Diabetic foot ulcer associated with type 2 diabetes mellitus ([place]) 02/19/2020  ? Heel ulcer ([place]) 11/05/2019  ? Gangrene of extremity due to atherosclerosis of artery ([place]) 09/09/2017  ? Essential hypertension, benign 02/07/2015  ? Renal insufficiency 02/07/2015  ? Post PTCA 11/09/2014  ? S/P PTCA (percutaneous transluminal coronary angioplasty) 11/09/2014  ? Pacemaker: [place] DR MRI [id number] Dual chamber pacemaker 10/08/2014 10/08/2014  ? Encounter for care of pacemaker 10/08/2014  ? Sinus node dysfunction ([place]) 10/08/2014  ? Cardiac asystole ([place]) 10/07/2014  ? Cardiac syncope 10/07/2014  ? Diabetes mellitus with stage 3 chronic kidney disease ([place]) 10/07/2014  ? CAD (coronary artery disease), native coronary artery 10/07/2014  ? Diabetes mellitus due to underlying condition without complications ([place]) 22/12/5425  ? Anemia, unspecified 05/25/2014  ? Syncope 04/30/2014  ? Atherosclerosis of native arteries of the extremities with gangrene ([place]) 12/16/2013  ? Acute renal failure ([place]) 10/28/2013  ? PVD (peripheral vascular disease) ([place]) 10/27/2013  ? Leukocytosis 10/21/2013  ? Anemia 10/21/2013  ? Osteomyelitis of left foot ([place]) 08/19/2013  ? Spinal stenosis, lumbar region, with neurogenic claudication 06/12/2012  ?  Class: Diagnosis of  ? Diabetes mellitus with end stage renal disease ([place]) 03/15/2009  ? ALCOHOL ABUSE 03/15/2009  ? TOBACCO USE 03/15/2009  ? HYPERTENSION 03/15/2009  ? ?Past Medical History:  ?Diagnosis Date  ? Acute blood loss anemia   ? Alcohol abuse   ? Anemia   ? Anemia of chronic disease   ? Arthritis   ? "patient does not think so."  ? Atrial fibrillation ([place])   ?  Cardiac syncope 10/07/2014  ? rapid A fib with 8 sec pauses on converison with syncope- temp pacing wire placed then PPM  ? Cataract   ? BILATERAL  ? Coronary artery disease   ? Diabetes mellitus   ? dx---

## 2022-03-02 ENCOUNTER — Other Ambulatory Visit: Payer: Self-pay

## 2022-03-02 DIAGNOSIS — Z992 Dependence on renal dialysis: Secondary | ICD-10-CM | POA: Diagnosis not present

## 2022-03-02 DIAGNOSIS — D631 Anemia in chronic kidney disease: Secondary | ICD-10-CM | POA: Diagnosis not present

## 2022-03-02 DIAGNOSIS — S78111A Complete traumatic amputation at level between right hip and knee, initial encounter: Secondary | ICD-10-CM

## 2022-03-02 DIAGNOSIS — N186 End stage renal disease: Secondary | ICD-10-CM | POA: Diagnosis not present

## 2022-03-02 DIAGNOSIS — E0821 Diabetes mellitus due to underlying condition with diabetic nephropathy: Secondary | ICD-10-CM | POA: Diagnosis not present

## 2022-03-02 DIAGNOSIS — D509 Iron deficiency anemia, unspecified: Secondary | ICD-10-CM | POA: Diagnosis not present

## 2022-03-02 DIAGNOSIS — N2581 Secondary hyperparathyroidism of renal origin: Secondary | ICD-10-CM | POA: Diagnosis not present

## 2022-03-05 DIAGNOSIS — D631 Anemia in chronic kidney disease: Secondary | ICD-10-CM | POA: Diagnosis not present

## 2022-03-05 DIAGNOSIS — D509 Iron deficiency anemia, unspecified: Secondary | ICD-10-CM | POA: Diagnosis not present

## 2022-03-05 DIAGNOSIS — E0821 Diabetes mellitus due to underlying condition with diabetic nephropathy: Secondary | ICD-10-CM | POA: Diagnosis not present

## 2022-03-05 DIAGNOSIS — Z992 Dependence on renal dialysis: Secondary | ICD-10-CM | POA: Diagnosis not present

## 2022-03-05 DIAGNOSIS — N2581 Secondary hyperparathyroidism of renal origin: Secondary | ICD-10-CM | POA: Diagnosis not present

## 2022-03-05 DIAGNOSIS — N186 End stage renal disease: Secondary | ICD-10-CM | POA: Diagnosis not present

## 2022-03-06 ENCOUNTER — Other Ambulatory Visit: Payer: Self-pay

## 2022-03-06 DIAGNOSIS — I739 Peripheral vascular disease, unspecified: Secondary | ICD-10-CM

## 2022-03-07 DIAGNOSIS — E0821 Diabetes mellitus due to underlying condition with diabetic nephropathy: Secondary | ICD-10-CM | POA: Diagnosis not present

## 2022-03-07 DIAGNOSIS — D631 Anemia in chronic kidney disease: Secondary | ICD-10-CM | POA: Diagnosis not present

## 2022-03-07 DIAGNOSIS — N186 End stage renal disease: Secondary | ICD-10-CM | POA: Diagnosis not present

## 2022-03-07 DIAGNOSIS — D509 Iron deficiency anemia, unspecified: Secondary | ICD-10-CM | POA: Diagnosis not present

## 2022-03-07 DIAGNOSIS — Z992 Dependence on renal dialysis: Secondary | ICD-10-CM | POA: Diagnosis not present

## 2022-03-07 DIAGNOSIS — N2581 Secondary hyperparathyroidism of renal origin: Secondary | ICD-10-CM | POA: Diagnosis not present

## 2022-03-08 ENCOUNTER — Ambulatory Visit (INDEPENDENT_AMBULATORY_CARE_PROVIDER_SITE_OTHER)
Admission: RE | Admit: 2022-03-08 | Discharge: 2022-03-08 | Disposition: A | Discharge status: Home / Self Care | Payer: Medicare Other | Source: Ambulatory Visit | Attending: Vascular Surgery | Admitting: Vascular Surgery

## 2022-03-08 ENCOUNTER — Ambulatory Visit (HOSPITAL_COMMUNITY)
Admission: RE | Admit: 2022-03-08 | Discharge: 2022-03-08 | Disposition: A | Discharge status: Home / Self Care | Payer: Medicare Other | Source: Ambulatory Visit | Attending: Vascular Surgery | Admitting: Vascular Surgery

## 2022-03-08 DIAGNOSIS — I739 Peripheral vascular disease, unspecified: Secondary | ICD-10-CM | POA: Insufficient documentation

## 2022-03-09 DIAGNOSIS — N2581 Secondary hyperparathyroidism of renal origin: Secondary | ICD-10-CM | POA: Diagnosis not present

## 2022-03-09 DIAGNOSIS — D509 Iron deficiency anemia, unspecified: Secondary | ICD-10-CM | POA: Diagnosis not present

## 2022-03-09 DIAGNOSIS — N186 End stage renal disease: Secondary | ICD-10-CM | POA: Diagnosis not present

## 2022-03-09 DIAGNOSIS — Z992 Dependence on renal dialysis: Secondary | ICD-10-CM | POA: Diagnosis not present

## 2022-03-09 DIAGNOSIS — E0821 Diabetes mellitus due to underlying condition with diabetic nephropathy: Secondary | ICD-10-CM | POA: Diagnosis not present

## 2022-03-09 DIAGNOSIS — D631 Anemia in chronic kidney disease: Secondary | ICD-10-CM | POA: Diagnosis not present

## 2022-03-11 DIAGNOSIS — Z992 Dependence on renal dialysis: Secondary | ICD-10-CM | POA: Diagnosis not present

## 2022-03-11 DIAGNOSIS — E1122 Type 2 diabetes mellitus with diabetic chronic kidney disease: Secondary | ICD-10-CM | POA: Diagnosis not present

## 2022-03-11 DIAGNOSIS — N186 End stage renal disease: Secondary | ICD-10-CM | POA: Diagnosis not present

## 2022-03-12 DIAGNOSIS — N2581 Secondary hyperparathyroidism of renal origin: Secondary | ICD-10-CM | POA: Diagnosis not present

## 2022-03-12 DIAGNOSIS — D631 Anemia in chronic kidney disease: Secondary | ICD-10-CM | POA: Diagnosis not present

## 2022-03-12 DIAGNOSIS — D509 Iron deficiency anemia, unspecified: Secondary | ICD-10-CM | POA: Diagnosis not present

## 2022-03-12 DIAGNOSIS — N186 End stage renal disease: Secondary | ICD-10-CM | POA: Diagnosis not present

## 2022-03-12 DIAGNOSIS — Z992 Dependence on renal dialysis: Secondary | ICD-10-CM | POA: Diagnosis not present

## 2022-03-14 DIAGNOSIS — D631 Anemia in chronic kidney disease: Secondary | ICD-10-CM | POA: Diagnosis not present

## 2022-03-14 DIAGNOSIS — Z992 Dependence on renal dialysis: Secondary | ICD-10-CM | POA: Diagnosis not present

## 2022-03-14 DIAGNOSIS — D509 Iron deficiency anemia, unspecified: Secondary | ICD-10-CM | POA: Diagnosis not present

## 2022-03-14 DIAGNOSIS — N2581 Secondary hyperparathyroidism of renal origin: Secondary | ICD-10-CM | POA: Diagnosis not present

## 2022-03-14 DIAGNOSIS — N186 End stage renal disease: Secondary | ICD-10-CM | POA: Diagnosis not present

## 2022-03-15 ENCOUNTER — Ambulatory Visit (INDEPENDENT_AMBULATORY_CARE_PROVIDER_SITE_OTHER): Payer: Medicare Other | Admitting: Orthopedic Surgery

## 2022-03-15 ENCOUNTER — Ambulatory Visit: Payer: Medicare Other

## 2022-03-15 DIAGNOSIS — S78111A Complete traumatic amputation at level between right hip and knee, initial encounter: Secondary | ICD-10-CM

## 2022-03-15 DIAGNOSIS — Z89432 Acquired absence of left foot: Secondary | ICD-10-CM

## 2022-03-15 MED ORDER — OXYCODONE-ACETAMINOPHEN 5-325 MG PO TABS: 1.0000 | ORAL_TABLET | ORAL | 0 refills | Status: DC | PRN

## 2022-03-16 DIAGNOSIS — N186 End stage renal disease: Secondary | ICD-10-CM | POA: Diagnosis not present

## 2022-03-16 DIAGNOSIS — Z992 Dependence on renal dialysis: Secondary | ICD-10-CM | POA: Diagnosis not present

## 2022-03-16 DIAGNOSIS — T82858A Stenosis of vascular prosthetic devices, implants and grafts, initial encounter: Secondary | ICD-10-CM | POA: Diagnosis not present

## 2022-03-16 DIAGNOSIS — I871 Compression of vein: Secondary | ICD-10-CM | POA: Diagnosis not present

## 2022-03-17 DIAGNOSIS — D509 Iron deficiency anemia, unspecified: Secondary | ICD-10-CM | POA: Diagnosis not present

## 2022-03-17 DIAGNOSIS — D631 Anemia in chronic kidney disease: Secondary | ICD-10-CM | POA: Diagnosis not present

## 2022-03-17 DIAGNOSIS — Z992 Dependence on renal dialysis: Secondary | ICD-10-CM | POA: Diagnosis not present

## 2022-03-17 DIAGNOSIS — N2581 Secondary hyperparathyroidism of renal origin: Secondary | ICD-10-CM | POA: Diagnosis not present

## 2022-03-17 DIAGNOSIS — N186 End stage renal disease: Secondary | ICD-10-CM | POA: Diagnosis not present

## 2022-03-19 DIAGNOSIS — Z992 Dependence on renal dialysis: Secondary | ICD-10-CM | POA: Diagnosis not present

## 2022-03-19 DIAGNOSIS — D631 Anemia in chronic kidney disease: Secondary | ICD-10-CM | POA: Diagnosis not present

## 2022-03-19 DIAGNOSIS — D509 Iron deficiency anemia, unspecified: Secondary | ICD-10-CM | POA: Diagnosis not present

## 2022-03-19 DIAGNOSIS — N186 End stage renal disease: Secondary | ICD-10-CM | POA: Diagnosis not present

## 2022-03-19 DIAGNOSIS — N2581 Secondary hyperparathyroidism of renal origin: Secondary | ICD-10-CM | POA: Diagnosis not present

## 2022-03-21 ENCOUNTER — Encounter: Payer: Self-pay | Admitting: Physical Medicine & Rehabilitation

## 2022-03-21 DIAGNOSIS — N2581 Secondary hyperparathyroidism of renal origin: Secondary | ICD-10-CM | POA: Diagnosis not present

## 2022-03-21 DIAGNOSIS — D631 Anemia in chronic kidney disease: Secondary | ICD-10-CM | POA: Diagnosis not present

## 2022-03-21 DIAGNOSIS — D509 Iron deficiency anemia, unspecified: Secondary | ICD-10-CM | POA: Diagnosis not present

## 2022-03-21 DIAGNOSIS — Z992 Dependence on renal dialysis: Secondary | ICD-10-CM | POA: Diagnosis not present

## 2022-03-21 DIAGNOSIS — N186 End stage renal disease: Secondary | ICD-10-CM | POA: Diagnosis not present

## 2022-03-22 ENCOUNTER — Ambulatory Visit (INDEPENDENT_AMBULATORY_CARE_PROVIDER_SITE_OTHER): Payer: Medicare Other | Admitting: Physician Assistant

## 2022-03-22 VITALS — BP 82/59 | HR 44 | Temp 97.6°F | Resp 20 | Ht 72.0 in

## 2022-03-22 DIAGNOSIS — I739 Peripheral vascular disease, unspecified: Secondary | ICD-10-CM | POA: Diagnosis not present

## 2022-03-22 NOTE — Progress Notes (Signed)
?Office Note  ? ? ? ?CC:  follow up ?Requesting Provider:  [name], MD ? ?HPI: Alexander [name] is a 61 y.o. ([birth year]) male who presents for surveillance of PAD.  Surgical history significant for right lower extremity bypass with subsequent right below the knee amputation.  He also has remote history of left femoral to TP trunk bypass.  He has since had a left TMA.  He is currently being followed by [name] for necrotic appearing ulcerations of the left TMA as well as the left shin and calf.  His left leg is wrapped from TMA to the knee and is not willing to remove dressing for our office visit.  He recently underwent left lower extremity arteriogram by Dr. Nicolette Bang in March demonstrating a patent bypass with severely diseased tibial vessels and single-vessel runoff via the PT artery.  Patient is continuing wound care currently in an attempt to salvage his leg.  He continues to smoke cigars daily.  He is on Eliquis daily.  He also takes a daily statin. ? ? ?Past Medical History:  ?Diagnosis Date  ? Acute blood loss anemia   ? Alcohol abuse   ? Anemia   ? Anemia of chronic disease   ? Arthritis   ? "patient does not think so."  ? Atrial fibrillation ([place])   ? Cardiac syncope 10/07/2014  ? rapid A fib with 8 sec pauses on converison with syncope- temp pacing wire placed then PPM  ? Cataract   ? BILATERAL  ? Coronary artery disease   ? Diabetes mellitus   ? dx---been  awhile  ? Elevated LFTs   ? Endocarditis of tricuspid valve   ? ESRD (end stage renal disease) ([place])   ? [place]  ? GERD (gastroesophageal reflux disease)   ? History of blood transfusion   ? Hyperlipemia   ? Hypertension   ? Malnutrition ([place])   ? Osteomyelitis ([place])   ? right foot  ? Peripheral vascular disease ([place])   ? Presence of permanent cardiac pacemaker 10/08/2014  ? [employer]  ? Tricuspid valve vegetation   ? ? ?Past Surgical History:  ?Procedure Laterality Date  ? A/V FISTULAGRAM Right 05/01/2021  ? Procedure: A/V  FISTULAGRAM;  Surgeon: Waynetta Sandy, MD;  Location: [facility];  Service: Cardiovascular;  Laterality: Right;  failed fistulagram  ? ABDOMINAL AORTOGRAM W/LOWER EXTREMITY N/A 09/09/2017  ? Procedure: ABDOMINAL AORTOGRAM W/LOWER EXTREMITY;  Surgeon: Angelia Mould, MD;  Location: [facility];  Service: Cardiovascular;  Laterality: N/A;  ? ABDOMINAL AORTOGRAM W/LOWER EXTREMITY Right 11/11/2019  ? Procedure: ABDOMINAL AORTOGRAM W/LOWER EXTREMITY;  Surgeon: Marty Heck, MD;  Location: [facility];  Service: Cardiovascular;  Laterality: Right;  ? ABDOMINAL AORTOGRAM W/LOWER EXTREMITY N/A 01/26/2022  ? Procedure: ABDOMINAL AORTOGRAM W/LOWER EXTREMITY;  Surgeon: Adrian Prows, MD;  Location: [facility];  Service: Cardiovascular;  Laterality: N/A;  ? AMPUTATION Left 08/19/2013  ? Procedure: AMPUTATION RAY;  Surgeon: Alta Corning, MD;  Location: [facility];  Service: Orthopedics;  Laterality: Left;  ray amputation left 5th  ? AMPUTATION Left 10/27/2013  ? Procedure: AMPUTATION DIGIT-LEFT 4TH TOE, 4th and 5th metatarsal.;  Surgeon: Angelia Mould, MD;  Location: [facility];  Service: Vascular;  Laterality: Left;  ? AMPUTATION Left 11/04/2013  ? Procedure: LEFT FOOT TRANS-METATARSAL AMPUTATION WITH WOUND CLOSURE ;  Surgeon: Alta Corning, MD;  Location: [facility];  Service: Orthopedics;  Laterality: Left;  ?  AMPUTATION Right 09/10/2017  ? Procedure: RIGHT FOURTH AND FIFTH TOE AMPUTATION;  Surgeon: Angelia Mould, MD;  Location: [facility];  Service: Vascular;  Laterality: Right;  ? AMPUTATION Right 02/19/2020  ? Procedure: AMPUTATION RIGHT 3RD TOE;  Surgeon: Angelia Mould, MD;  Location: [facility];  Service: Vascular;  Laterality: Right;  ? AMPUTATION Right 03/11/2020  ? Procedure: RIGHT BELOW KNEE AMPUTATION;  Surgeon: Newt Minion, MD;  Location: [facility];  Service: Orthopedics;  Laterality: Right;  ? AMPUTATION Right 01/31/2022  ? Procedure: RIGHT ABOVE KNEE  AMPUTATION;  Surgeon: Newt Minion, MD;  Location: [facility];  Service: Orthopedics;  Laterality: Right;  ? APPLICATION OF WOUND VAC Right 02/19/2020  ? Procedure: Application Of Wound Vac Right foot;  Surgeon: Angelia Mould, MD;  Location: [facility] OR;  Service: Vascular;  Laterality: Right;  ? AV FISTULA PLACEMENT Right 03/27/2021  ? Procedure: RIGHT ARM ARTERIOVENOUS (AV) FISTULA;  Surgeon: Angelia Mould, MD;  Location: [facility];  Service: Vascular;  Laterality: Right;  ? AV FISTULA PLACEMENT Right 05/10/2021  ? Procedure: RIGHT ARM ARTERIOVENOUS (AV) FISTULA;  Surgeon: Waynetta Sandy, MD;  Location: [facility];  Service: Vascular;  Laterality: Right;  ? BACK SURGERY    ? BASCILIC VEIN TRANSPOSITION Right 08/11/2021  ? Procedure: RIGHT ARM SECOND STAGE BASILIC VEIN TRANSPOSITION;  Surgeon: Waynetta Sandy, MD;  Location: [facility];  Service: Vascular;  Laterality: Right;  ? BIOPSY  07/20/2020  ? Procedure: BIOPSY;  Surgeon: Irving Copas., MD;  Location: [facility];  Service: Gastroenterology;;  ? CARDIAC CATHETERIZATION  10/07/2014  ? Procedure: LEFT HEART CATH AND CORONARY ANGIOGRAPHY;  Surgeon: Laverda Page, MD;  Location[facility];  Service: Cardiovascular;;  ? COLONOSCOPY W/ POLYPECTOMY    ? COLONOSCOPY WITH PROPOFOL N/A 05/25/2020  ? Procedure: COLONOSCOPY WITH PROPOFOL;  Surgeon: Gatha Mayer, MD;  Location: [facility];  Service: Endoscopy;  Laterality: N/A;  ? EMBOLECTOMY Right 09/12/2017  ? Procedure: Thrombectomy  & Redo Right Below Knee Popliteal Artery Bypass Graft  ;  Surgeon: Waynetta Sandy, MD;  Location: [facility];  Service: Vascular;  Laterality: Right;  ? ENTEROSCOPY N/A 07/20/2020  ? Procedure: ENTEROSCOPY;  Surgeon: Rush Landmark Telford Nab., MD;  Location: [facility];  Service: Gastroenterology;  Laterality: N/A;  ? ESOPHAGOGASTRODUODENOSCOPY (EGD) WITH PROPOFOL N/A 05/24/2020  ? Procedure: ESOPHAGOGASTRODUODENOSCOPY (EGD) WITH PROPOFOL;   Surgeon: Ladene Artist, MD;  Location: [facility];  Service: Endoscopy;  Laterality: N/A;  ? EYE SURGERY Bilateral   ? cataract  ? FEMORAL-TIBIAL BYPASS GRAFT Left 10/27/2013  ? Procedure: BYPASS GRAFT LEFT FEMORAL- POSTERIOR TIBIAL ARTERY;  Surgeon: Angelia Mould, MD;  Location: [facility];  Service: Vascular;  Laterality: Left;  ? FEMORAL-TIBIAL BYPASS GRAFT Right 09/10/2017  ? Procedure: RIGHT SUPERFICIAL  FEMORAL ARTERY-BELOW KNEE POPLITEAL ARTERY BYPASS GRAFT WITH VEIN;  Surgeon: Angelia Mould, MD;  Location: [facility];  Service: Vascular;  Laterality: Right;  ? GIVENS CAPSULE STUDY N/A 07/20/2020  ? Procedure: GIVENS CAPSULE STUDY;  Surgeon: Irving Copas., MD;  Location: [facility];  Service: Gastroenterology;  Laterality: N/A;  capsule placed through scope into duodenum @ 0936  ? I & D EXTREMITY Left 10/27/2013  ? Procedure: IRRIGATION AND DEBRIDEMENT EXTREMITY- LEFT FOOT;  Surgeon: Angelia Mould, MD;  Location: [facility];  Service: Vascular;  Laterality: Left;  ? I & D EXTREMITY Right 01/26/2020  ? Procedure: IRRIGATION AND DEBRIDEMENT EXTREMITY RIGHT FOOT WOUND;  Surgeon: Scot Dock,  Judeth Cornfield, MD;  Location: [facility];  Service: Vascular;  Laterality: Right;  ? INSERT / REPLACE / REMOVE PACEMAKER    ? INSERTION OF DIALYSIS CATHETER Right 11/03/2021  ? Procedure: RIGHT INTERNAL JUGULAR INSERTION OF TUNNELED DIALYSIS CATHETER;  Surgeon: Angelia Mould, MD;  Location: [facility];  Service: Vascular;  Laterality: Right;  ? INTRAOPERATIVE ARTERIOGRAM Right 09/10/2017  ? Procedure: INTRA OPERATIVE ARTERIOGRAM;  Surgeon: Angelia Mould, MD;  Location: [facility];  Service: Vascular;  Laterality: Right;  ? IR ANGIOGRAM FOLLOW UP STUDY  09/09/2017  ? IR FLUORO GUIDE CV LINE RIGHT  05/31/2020  ? IR LUMBAR DISC ASPIRATION W/IMG GUIDE  01/19/2022  ? IR US GUIDE VASC ACCESS RIGHT  06/01/2020  ? LEFT HEART CATH AND CORONARY ANGIOGRAPHY N/A 08/19/2020  ? Procedure: LEFT HEART CATH AND  CORONARY ANGIOGRAPHY;  Surgeon: Adrian Prows, MD;  Location: [facility];  Service: Cardiovascular;  Laterality: N/A;  ? LEFT HEART CATHETERIZATION WITH CORONARY ANGIOGRAM N/A 11/09/2014  ? Procedure: LEFT HEART CATHE

## 2022-03-23 DIAGNOSIS — D509 Iron deficiency anemia, unspecified: Secondary | ICD-10-CM | POA: Diagnosis not present

## 2022-03-23 DIAGNOSIS — D631 Anemia in chronic kidney disease: Secondary | ICD-10-CM | POA: Diagnosis not present

## 2022-03-23 DIAGNOSIS — Z992 Dependence on renal dialysis: Secondary | ICD-10-CM | POA: Diagnosis not present

## 2022-03-23 DIAGNOSIS — N2581 Secondary hyperparathyroidism of renal origin: Secondary | ICD-10-CM | POA: Diagnosis not present

## 2022-03-23 DIAGNOSIS — N186 End stage renal disease: Secondary | ICD-10-CM | POA: Diagnosis not present

## 2022-03-26 DIAGNOSIS — N2581 Secondary hyperparathyroidism of renal origin: Secondary | ICD-10-CM | POA: Diagnosis not present

## 2022-03-26 DIAGNOSIS — D631 Anemia in chronic kidney disease: Secondary | ICD-10-CM | POA: Diagnosis not present

## 2022-03-26 DIAGNOSIS — Z992 Dependence on renal dialysis: Secondary | ICD-10-CM | POA: Diagnosis not present

## 2022-03-26 DIAGNOSIS — N186 End stage renal disease: Secondary | ICD-10-CM | POA: Diagnosis not present

## 2022-03-26 DIAGNOSIS — D509 Iron deficiency anemia, unspecified: Secondary | ICD-10-CM | POA: Diagnosis not present

## 2022-03-27 DIAGNOSIS — D631 Anemia in chronic kidney disease: Secondary | ICD-10-CM | POA: Diagnosis not present

## 2022-03-27 DIAGNOSIS — E11622 Type 2 diabetes mellitus with other skin ulcer: Secondary | ICD-10-CM | POA: Diagnosis not present

## 2022-03-27 DIAGNOSIS — L89153 Pressure ulcer of sacral region, stage 3: Secondary | ICD-10-CM | POA: Diagnosis not present

## 2022-03-27 DIAGNOSIS — I5022 Chronic systolic (congestive) heart failure: Secondary | ICD-10-CM | POA: Diagnosis not present

## 2022-03-27 DIAGNOSIS — E785 Hyperlipidemia, unspecified: Secondary | ICD-10-CM | POA: Diagnosis not present

## 2022-03-27 DIAGNOSIS — L97821 Non-pressure chronic ulcer of other part of left lower leg limited to breakdown of skin: Secondary | ICD-10-CM | POA: Diagnosis not present

## 2022-03-27 DIAGNOSIS — E1152 Type 2 diabetes mellitus with diabetic peripheral angiopathy with gangrene: Secondary | ICD-10-CM | POA: Diagnosis not present

## 2022-03-27 DIAGNOSIS — E1122 Type 2 diabetes mellitus with diabetic chronic kidney disease: Secondary | ICD-10-CM | POA: Diagnosis not present

## 2022-03-27 DIAGNOSIS — L89152 Pressure ulcer of sacral region, stage 2: Secondary | ICD-10-CM | POA: Diagnosis not present

## 2022-03-27 DIAGNOSIS — I2510 Atherosclerotic heart disease of native coronary artery without angina pectoris: Secondary | ICD-10-CM | POA: Diagnosis not present

## 2022-03-27 DIAGNOSIS — N186 End stage renal disease: Secondary | ICD-10-CM | POA: Diagnosis not present

## 2022-03-27 DIAGNOSIS — K219 Gastro-esophageal reflux disease without esophagitis: Secondary | ICD-10-CM | POA: Diagnosis not present

## 2022-03-27 DIAGNOSIS — I480 Paroxysmal atrial fibrillation: Secondary | ICD-10-CM | POA: Diagnosis not present

## 2022-03-27 DIAGNOSIS — E43 Unspecified severe protein-calorie malnutrition: Secondary | ICD-10-CM | POA: Diagnosis not present

## 2022-03-27 DIAGNOSIS — I132 Hypertensive heart and chronic kidney disease with heart failure and with stage 5 chronic kidney disease, or end stage renal disease: Secondary | ICD-10-CM | POA: Diagnosis not present

## 2022-03-27 DIAGNOSIS — T8781 Dehiscence of amputation stump: Secondary | ICD-10-CM | POA: Diagnosis not present

## 2022-03-28 DIAGNOSIS — Z992 Dependence on renal dialysis: Secondary | ICD-10-CM | POA: Diagnosis not present

## 2022-03-28 DIAGNOSIS — D509 Iron deficiency anemia, unspecified: Secondary | ICD-10-CM | POA: Diagnosis not present

## 2022-03-28 DIAGNOSIS — N2581 Secondary hyperparathyroidism of renal origin: Secondary | ICD-10-CM | POA: Diagnosis not present

## 2022-03-28 DIAGNOSIS — N186 End stage renal disease: Secondary | ICD-10-CM | POA: Diagnosis not present

## 2022-03-28 DIAGNOSIS — D631 Anemia in chronic kidney disease: Secondary | ICD-10-CM | POA: Diagnosis not present

## 2022-03-29 DIAGNOSIS — T82858A Stenosis of vascular prosthetic devices, implants and grafts, initial encounter: Secondary | ICD-10-CM | POA: Diagnosis not present

## 2022-03-29 DIAGNOSIS — Z992 Dependence on renal dialysis: Secondary | ICD-10-CM | POA: Diagnosis not present

## 2022-03-29 DIAGNOSIS — N186 End stage renal disease: Secondary | ICD-10-CM | POA: Diagnosis not present

## 2022-03-29 DIAGNOSIS — I871 Compression of vein: Secondary | ICD-10-CM | POA: Diagnosis not present

## 2022-03-30 DIAGNOSIS — N186 End stage renal disease: Secondary | ICD-10-CM | POA: Diagnosis not present

## 2022-03-30 DIAGNOSIS — N2581 Secondary hyperparathyroidism of renal origin: Secondary | ICD-10-CM | POA: Diagnosis not present

## 2022-03-30 DIAGNOSIS — Z992 Dependence on renal dialysis: Secondary | ICD-10-CM | POA: Diagnosis not present

## 2022-03-30 DIAGNOSIS — D509 Iron deficiency anemia, unspecified: Secondary | ICD-10-CM | POA: Diagnosis not present

## 2022-03-30 DIAGNOSIS — D631 Anemia in chronic kidney disease: Secondary | ICD-10-CM | POA: Diagnosis not present

## 2022-04-02 DIAGNOSIS — D631 Anemia in chronic kidney disease: Secondary | ICD-10-CM | POA: Diagnosis not present

## 2022-04-02 DIAGNOSIS — N2581 Secondary hyperparathyroidism of renal origin: Secondary | ICD-10-CM | POA: Diagnosis not present

## 2022-04-02 DIAGNOSIS — Z992 Dependence on renal dialysis: Secondary | ICD-10-CM | POA: Diagnosis not present

## 2022-04-02 DIAGNOSIS — N186 End stage renal disease: Secondary | ICD-10-CM | POA: Diagnosis not present

## 2022-04-02 DIAGNOSIS — D509 Iron deficiency anemia, unspecified: Secondary | ICD-10-CM | POA: Diagnosis not present

## 2022-04-04 DIAGNOSIS — D509 Iron deficiency anemia, unspecified: Secondary | ICD-10-CM | POA: Diagnosis not present

## 2022-04-04 DIAGNOSIS — N186 End stage renal disease: Secondary | ICD-10-CM | POA: Diagnosis not present

## 2022-04-04 DIAGNOSIS — N2581 Secondary hyperparathyroidism of renal origin: Secondary | ICD-10-CM | POA: Diagnosis not present

## 2022-04-04 DIAGNOSIS — Z992 Dependence on renal dialysis: Secondary | ICD-10-CM | POA: Diagnosis not present

## 2022-04-04 DIAGNOSIS — D631 Anemia in chronic kidney disease: Secondary | ICD-10-CM | POA: Diagnosis not present

## 2022-04-06 ENCOUNTER — Other Ambulatory Visit: Payer: Self-pay | Admitting: Orthopedic Surgery

## 2022-04-06 ENCOUNTER — Telehealth: Payer: Self-pay | Admitting: Family

## 2022-04-06 DIAGNOSIS — N186 End stage renal disease: Secondary | ICD-10-CM | POA: Diagnosis not present

## 2022-04-06 DIAGNOSIS — N2581 Secondary hyperparathyroidism of renal origin: Secondary | ICD-10-CM | POA: Diagnosis not present

## 2022-04-06 DIAGNOSIS — D509 Iron deficiency anemia, unspecified: Secondary | ICD-10-CM | POA: Diagnosis not present

## 2022-04-06 DIAGNOSIS — Z992 Dependence on renal dialysis: Secondary | ICD-10-CM | POA: Diagnosis not present

## 2022-04-06 DIAGNOSIS — D631 Anemia in chronic kidney disease: Secondary | ICD-10-CM | POA: Diagnosis not present

## 2022-04-06 MED ORDER — OXYCODONE-ACETAMINOPHEN 5-325 MG PO TABS: 1.0000 | ORAL_TABLET | ORAL | 0 refills | Status: DC | PRN

## 2022-04-06 NOTE — Telephone Encounter (Signed)
Amy (RN) called requesting a refill pain meds and bade cream for wound on leg. Please call Amy back at [phone].

## 2022-04-06 NOTE — Telephone Encounter (Signed)
SW Amy. Pt needs refill of oxycodone #30, last filled 03/15/22 She also mentioned that pts wounds on left leg are looking better. Areas are flaking off, underneath eschar has scar tissue. She did states that 9 areas are lifted up on the edges with some discharge. She would like to know if she can use betadine on those areas to help dry them out. Current wound care is dial soap cleansing with guaze and ace wrap.

## 2022-04-09 DIAGNOSIS — N186 End stage renal disease: Secondary | ICD-10-CM | POA: Diagnosis not present

## 2022-04-09 DIAGNOSIS — D631 Anemia in chronic kidney disease: Secondary | ICD-10-CM | POA: Diagnosis not present

## 2022-04-09 DIAGNOSIS — Z992 Dependence on renal dialysis: Secondary | ICD-10-CM | POA: Diagnosis not present

## 2022-04-09 DIAGNOSIS — D509 Iron deficiency anemia, unspecified: Secondary | ICD-10-CM | POA: Diagnosis not present

## 2022-04-09 DIAGNOSIS — N2581 Secondary hyperparathyroidism of renal origin: Secondary | ICD-10-CM | POA: Diagnosis not present

## 2022-04-10 NOTE — Telephone Encounter (Signed)
Pt has an appt on 04/12/22, can check at that time on the betadine.

## 2022-04-11 DIAGNOSIS — E1122 Type 2 diabetes mellitus with diabetic chronic kidney disease: Secondary | ICD-10-CM | POA: Diagnosis not present

## 2022-04-11 DIAGNOSIS — N2581 Secondary hyperparathyroidism of renal origin: Secondary | ICD-10-CM | POA: Diagnosis not present

## 2022-04-11 DIAGNOSIS — N186 End stage renal disease: Secondary | ICD-10-CM | POA: Diagnosis not present

## 2022-04-11 DIAGNOSIS — Z992 Dependence on renal dialysis: Secondary | ICD-10-CM | POA: Diagnosis not present

## 2022-04-11 DIAGNOSIS — D631 Anemia in chronic kidney disease: Secondary | ICD-10-CM | POA: Diagnosis not present

## 2022-04-11 DIAGNOSIS — D509 Iron deficiency anemia, unspecified: Secondary | ICD-10-CM | POA: Diagnosis not present

## 2022-04-12 ENCOUNTER — Ambulatory Visit (INDEPENDENT_AMBULATORY_CARE_PROVIDER_SITE_OTHER): Payer: Medicare Other | Admitting: Orthopedic Surgery

## 2022-04-12 ENCOUNTER — Encounter: Payer: Self-pay | Admitting: Orthopedic Surgery

## 2022-04-12 DIAGNOSIS — S78111A Complete traumatic amputation at level between right hip and knee, initial encounter: Secondary | ICD-10-CM

## 2022-04-12 DIAGNOSIS — Z89611 Acquired absence of right leg above knee: Secondary | ICD-10-CM

## 2022-04-12 DIAGNOSIS — Z89432 Acquired absence of left foot: Secondary | ICD-10-CM

## 2022-04-12 DIAGNOSIS — I96 Gangrene, not elsewhere classified: Secondary | ICD-10-CM

## 2022-04-12 DIAGNOSIS — I739 Peripheral vascular disease, unspecified: Secondary | ICD-10-CM

## 2022-04-12 NOTE — Progress Notes (Signed)
Office Visit Note   Patient: Alexander [name]           Date of Birth: [birth year]           MRN: [id number] Visit Date: 04/12/2022              Requested by: [name], MD [address] [place],  [place] [address] PCP: [name], MD  Chief Complaint  Patient presents with   Right Leg - Routine Post Op    01/31/22 right AKA      HPI: Patient is a 61 year old gentleman who is status post a left transmetatarsal amputation and status post a right above-knee amputation about 5 months ago.  Patient has had ischemic changes to the left lower extremity is currently undergoing home health nursing dressing changes.  Patient is working with Museum/gallery curator for a K2 level prosthesis.  Assessment & Plan: Visit Diagnoses:  1. Unilateral AKA, right ([place])   2. S/P transmetatarsal amputation of foot, left ([place])   3. PVD (peripheral vascular disease) ([place])   4. Gangrene of left foot ([place])     Plan: Patient will continue with wound care with home health nursing on the left nursing has expressed interest in using Betadine over the black eschar areas they will use the Betadine to help dry out the wounds.  Follow-Up Instructions: Return in about 4 weeks (around 05/10/2022).   Ortho Exam  Patient is alert, oriented, no adenopathy, well-dressed, normal affect, normal respiratory effort. Examination patient has a stable right above-the-knee amputation.  Left leg there are multiple areas of black eschar from dry gangrenous changes.  The leg is looking better the skin wrinkles now.  There is no cellulitis.  There is black eschar over the transmetatarsal amputation on the left left heel left ankle and anterior left leg.  Imaging: No results found.   Labs: Lab Results  Component Value Date   HGBA1C 6.8 (H) 10/29/2021   HGBA1C 5.5 05/23/2020   HGBA1C 8.6 (H) 01/27/2020   ESRSEDRATE 25 (H) 01/17/2022   ESRSEDRATE 25 (H) 01/17/2022   ESRSEDRATE 30 (H) 01/16/2022   CRP 14.9 (H) 01/16/2022    CRP 14.0 (H) 01/15/2022   REPTSTATUS 01/24/2022 FINAL 01/19/2022   GRAMSTAIN  01/19/2022    RARE WBC PRESENT,BOTH PMN AND MONONUCLEAR NO ORGANISMS SEEN    CULT  01/19/2022    No growth aerobically or anaerobically. Performed at [facility], [place] [address] [place],  [address]    LABORGA STREPTOCOCCUS MITIS/ORALIS 01/26/2020     Lab Results  Component Value Date   ALBUMIN 2.3 (L) 02/13/2022   ALBUMIN 2.2 (L) 02/08/2022   ALBUMIN 2.1 (L) 02/06/2022    Lab Results  Component Value Date   MG 1.7 01/13/2022   MG 1.9 07/19/2020   MG 1.8 05/25/2020   No results found for: VD25OH  No results found for: PREALBUMIN    Latest Ref Rng & Units 02/13/2022    4:03 AM 02/08/2022    8:02 AM 02/06/2022    8:34 AM  CBC EXTENDED  WBC 4.0 - 10.5 K/uL 10.9   10.5   10.5    RBC 4.22 - 5.81 MIL/uL 3.55   3.42   3.50    Hemoglobin 13.0 - 17.0 g/dL 10.6   10.1   10.3    HCT 39.0 - 52.0 % 33.6   32.5   33.3    Platelets 150 - 400 K/uL 135   192   204  NEUT# 1.7 - 7.7 K/uL 6.6      Lymph# 0.7 - 4.0 K/uL 2.9         There is no height or weight on file to calculate BMI.  Orders:  No orders of the defined types were placed in this encounter.  No orders of the defined types were placed in this encounter.    Procedures: No procedures performed  Clinical Data: No additional findings.  ROS:  All other systems negative, except as noted in the HPI. Review of Systems  Objective: Vital Signs: There were no vitals taken for this visit.  Specialty Comments:  No specialty comments available.  PMFS History: Patient Active Problem List   Diagnosis Date Noted   Goals of care, counseling/discussion    Palliative care by specialist    Dehiscence of amputation stump of right lower extremity ([place])    Endocarditis 01/21/2022   Ulcer of right leg, limited to breakdown of skin ([place])    Below-knee amputation of right lower extremity ([place])    Ulcer of left foot, limited to  breakdown of skin ([place])    History of transmetatarsal amputation of left foot ([place])    Lumbar back pain with radiculopathy affecting left lower extremity 01/14/2022   Dyslipidemia 01/14/2022   Type 2 diabetes mellitus with chronic kidney disease, with long-term current use of insulin ([place]) 01/14/2022   GERD without esophagitis 01/14/2022   Pressure injury of skin 01/14/2022   Intractable back pain 01/13/2022   Blister 11/08/2021   Severe protein-calorie malnutrition ([place]) 11/02/2021   Pacemaker infection ([place])    Transaminitis    Tricuspid valve vegetation    Thrombocytopenia ([place]) 10/26/2021   Hyponatremia 10/26/2021   Acute on chronic diastolic CHF (congestive heart failure) ([place]) 10/26/2021   Atrial fibrillation with RVR ([place]) 10/25/2021   Symptomatic anemia 09/30/2021   ESRD on dialysis ([place]) 09/30/2021   PAF (paroxysmal atrial fibrillation) ([place]) 09/30/2021   NSVT (nonsustained ventricular tachycardia) ([place])    Pancytopenia ([place])    Acute blood loss anemia 07/18/2020   Hemodialysis patient ([place]) 06/04/2020   Necrotizing glomerulonephritis 06/04/2020   Heme positive stool    Melena    Acute GI bleeding 05/23/2020   Acute hematogenous osteomyelitis of right foot ([place]) 03/11/2020   Gangrene of right foot ([place])    Diabetic foot ulcer associated with type 2 diabetes mellitus ([place]) 02/19/2020   Heel ulcer ([place]) 11/05/2019   Gangrene of extremity due to atherosclerosis of artery ([place]) 09/09/2017   Essential hypertension, benign 02/07/2015   Renal insufficiency 02/07/2015   Post PTCA 11/09/2014   S/P PTCA (percutaneous transluminal coronary angioplasty) 11/09/2014   Pacemaker: [place] DR MRI [id number] Dual chamber pacemaker 10/08/2014 10/08/2014   Encounter for care of pacemaker 10/08/2014   Sinus node dysfunction ([place]) 10/08/2014   Cardiac asystole ([place]) 10/07/2014   Cardiac syncope 10/07/2014   Diabetes mellitus with stage 3 chronic kidney disease ([place]) 10/07/2014    CAD (coronary artery disease), native coronary artery 10/07/2014   Diabetes mellitus due to underlying condition without complications ([place]) 39/76/7341   Anemia, unspecified 05/25/2014   Syncope 04/30/2014   Atherosclerosis of native arteries of the extremities with gangrene ([place]) 12/16/2013   Acute renal failure ([place]) 10/28/2013   PVD (peripheral vascular disease) ([place]) 10/27/2013   Leukocytosis 10/21/2013   Anemia 10/21/2013   Osteomyelitis of left foot ([place]) 08/19/2013   Spinal stenosis, lumbar region, with neurogenic claudication 06/12/2012    Class: Diagnosis of   Diabetes mellitus with end  stage renal disease ([place]) 03/15/2009   ALCOHOL ABUSE 03/15/2009   TOBACCO USE 03/15/2009   HYPERTENSION 03/15/2009   Past Medical History:  Diagnosis Date   Acute blood loss anemia    Alcohol abuse    Anemia    Anemia of chronic disease    Arthritis    "patient does not think so."   Atrial fibrillation ([place])    Cardiac syncope 10/07/2014   rapid A fib with 8 sec pauses on converison with syncope- temp pacing wire placed then PPM   Cataract    BILATERAL   Coronary artery disease    Diabetes mellitus    dx---been  awhile   Elevated LFTs    Endocarditis of tricuspid valve    ESRD (end stage renal disease) ([place])    [place]   GERD (gastroesophageal reflux disease)    History of blood transfusion    Hyperlipemia    Hypertension    Malnutrition ([place])    Osteomyelitis (HCC)    right foot   Peripheral vascular disease ([place])    Presence of permanent cardiac pacemaker 10/08/2014   Medtronic   Tricuspid valve vegetation     Family History  Problem Relation Age of Onset   Diabetes type II Mother    Hypertension Mother    Diabetes Mother    Liver cancer Father    Diabetes type II Sister    Breast cancer Sister    Diabetes Sister    Hypertension Sister    Diabetes type II Brother    Kidney failure Brother    Diabetes Brother    Hypertension Brother     Diabetes type II Sister     Past Surgical History:  Procedure Laterality Date   A/V FISTULAGRAM Right 05/01/2021   Procedure: A/V FISTULAGRAM;  Surgeon: Waynetta Sandy, MD;  Location: [facility];  Service: Cardiovascular;  Laterality: Right;  failed fistulagram   ABDOMINAL AORTOGRAM W/LOWER EXTREMITY N/A 09/09/2017   Procedure: ABDOMINAL AORTOGRAM W/LOWER EXTREMITY;  Surgeon: Angelia Mould, MD;  Location: [facility];  Service: Cardiovascular;  Laterality: N/A;   ABDOMINAL AORTOGRAM W/LOWER EXTREMITY Right 11/11/2019   Procedure: ABDOMINAL AORTOGRAM W/LOWER EXTREMITY;  Surgeon: Marty Heck, MD;  Location: [facility];  Service: Cardiovascular;  Laterality: Right;   ABDOMINAL AORTOGRAM W/LOWER EXTREMITY N/A 01/26/2022   Procedure: ABDOMINAL AORTOGRAM W/LOWER EXTREMITY;  Surgeon: Adrian Prows, MD;  Location: [place] [facility] [facility];  Service: Cardiovascular;  Laterality: N/A;   AMPUTATION Left 08/19/2013   Procedure: AMPUTATION RAY;  Surgeon: Alta Corning, MD;  Location: [facility];  Service: Orthopedics;  Laterality: Left;  ray amputation left 5th   AMPUTATION Left 10/27/2013   Procedure: AMPUTATION DIGIT-LEFT 4TH TOE, 4th and 5th metatarsal.;  Surgeon: Angelia Mould, MD;  Location: [facility];  Service: Vascular;  Laterality: Left;   AMPUTATION Left 11/04/2013   Procedure: LEFT FOOT TRANS-METATARSAL AMPUTATION WITH WOUND CLOSURE ;  Surgeon: Alta Corning, MD;  Location: [facility] [place];  Service: Orthopedics;  Laterality: Left;   AMPUTATION Right 09/10/2017   Procedure: RIGHT FOURTH AND FIFTH TOE AMPUTATION;  Surgeon: Angelia Mould, MD;  Location: [facility];  Service: Vascular;  Laterality: Right;   AMPUTATION Right 02/19/2020   Procedure: AMPUTATION RIGHT 3RD TOE;  Surgeon: Angelia Mould, MD;  Location: [place];  Service: Vascular;  Laterality: Right;   AMPUTATION Right 03/11/2020   Procedure: RIGHT BELOW KNEE AMPUTATION;  Surgeon: Newt Minion,  MD;  Location: [place];  Service: Orthopedics;  Laterality: Right;   AMPUTATION Right 01/31/2022   Procedure: RIGHT ABOVE KNEE AMPUTATION;  Surgeon: Newt Minion, MD;  Location: [place];  Service: Orthopedics;  Laterality: Right;   APPLICATION OF WOUND VAC Right 02/19/2020   Procedure: Application Of Wound Vac Right foot;  Surgeon: Angelia Mould, MD;  Location: [facility];  Service: Vascular;  Laterality: Right;   AV FISTULA PLACEMENT Right 03/27/2021   Procedure: RIGHT ARM ARTERIOVENOUS (AV) FISTULA;  Surgeon: Angelia Mould, MD;  Location: [place];  Service: Vascular;  Laterality: Right;   AV FISTULA PLACEMENT Right 05/10/2021   Procedure: RIGHT ARM ARTERIOVENOUS (AV) FISTULA;  Surgeon: Waynetta Sandy, MD;  Location: [place];  Service: Vascular;  Laterality: Right;   [place] Right 08/11/2021   Procedure: RIGHT ARM SECOND STAGE BASILIC VEIN TRANSPOSITION;  Surgeon: Waynetta Sandy, MD;  Location: [place] [facility];  Service: Vascular;  Laterality: Right;   BIOPSY  07/20/2020   Procedure: BIOPSY;  Surgeon: Irving Copas., MD;  Location: [facility];  Service: Gastroenterology;;   CARDIAC CATHETERIZATION  10/07/2014   Procedure: LEFT HEART CATH AND CORONARY ANGIOGRAPHY;  Surgeon: Laverda Page, MD;  Location: [facility] C[facility] LAB;  Service: Cardiovascular;;   COLONOSCOPY W/ POLYPECTOMY     COLONOSCOPY WITH PROPOFOL N/A 05/25/2020   Procedure: COLONOSCOPY WITH PROPOFOL;  Surgeon: Gatha Mayer, MD;  Location: [facility];  Service: Endoscopy;  Laterality: N/A;   EMBOLECTOMY Right 09/12/2017   Procedure: Thrombectomy  & Redo Right Below Knee Popliteal Artery Bypass Graft  ;  Surgeon: Waynetta Sandy, MD;  Location: [facility];  Service: Vascular;  Laterality: Right;   ENTEROSCOPY N/A 07/20/2020   Procedure: ENTEROSCOPY;  Surgeon: Rush Landmark Telford Nab., MD;  Location: [place];  Service: Gastroenterology;  Laterality: N/A;    ESOPHAGOGASTRODUODENOSCOPY (EGD) WITH PROPOFOL N/A 05/24/2020   Procedure: ESOPHAGOGASTRODUODENOSCOPY (EGD) WITH PROPOFOL;  Surgeon: Ladene Artist, MD;  Location: [facility];  Service: Endoscopy;  Laterality: N/A;   EYE SURGERY Bilateral    cataract   FEMORAL-TIBIAL BYPASS GRAFT Left 10/27/2013   Procedure: BYPASS GRAFT LEFT FEMORAL- POSTERIOR TIBIAL ARTERY;  Surgeon: Angelia Mould, MD;  Location: [facility];  Service: Vascular;  Laterality: Left;   FEMORAL-TIBIAL BYPASS GRAFT Right 09/10/2017   Procedure: RIGHT SUPERFICIAL  FEMORAL ARTERY-BELOW KNEE POPLITEAL ARTERY BYPASS GRAFT WITH VEIN;  Surgeon: Angelia Mould, MD;  Location: [place];  Service: Vascular;  Laterality: Right;   GIVENS CAPSULE STUDY N/A 07/20/2020   Procedure: GIVENS CAPSULE STUDY;  Surgeon: Irving Copas., MD;  Location: [facility];  Service: Gastroenterology;  Laterality: N/A;  capsule placed through scope into duodenum @ 0936   I & D EXTREMITY Left 10/27/2013   Procedure: IRRIGATION AND DEBRIDEMENT EXTREMITY- LEFT FOOT;  Surgeon: Angelia Mould, MD;  Location: [place];  Service: Vascular;  Laterality: Left;   I & D EXTREMITY Right 01/26/2020   Procedure: IRRIGATION AND DEBRIDEMENT EXTREMITY RIGHT FOOT WOUND;  Surgeon: Angelia Mould, MD;  Location: [place];  Service: Vascular;  Laterality: Right;   INSERT / REPLACE / REMOVE PACEMAKER     INSERTION OF DIALYSIS CATHETER Right 11/03/2021   Procedure: RIGHT INTERNAL JUGULAR INSERTION OF TUNNELED DIALYSIS CATHETER;  Surgeon: Angelia Mould, MD;  Location: [place];  Service: Vascular;  Laterality: Right;   INTRAOPERATIVE ARTERIOGRAM Right 09/10/2017   Procedure: INTRA OPERATIVE ARTERIOGRAM;  Surgeon: [name]  S, MD;  Location: [facility];  Service: Vascular;  Laterality: Right;   IR ANGIOGRAM FOLLOW UP STUDY  09/09/2017   IR FLUORO GUIDE CV LINE RIGHT  05/31/2020   IR LUMBAR DISC ASPIRATION W/IMG GUIDE  01/19/2022   IR US GUIDE  VASC ACCESS RIGHT  06/01/2020   LEFT HEART CATH AND CORONARY ANGIOGRAPHY N/A 08/19/2020   Procedure: LEFT HEART CATH AND CORONARY ANGIOGRAPHY;  Surgeon: Adrian Prows, MD;  Location: [facility];  Service: Cardiovascular;  Laterality: N/A;   LEFT HEART CATHETERIZATION WITH CORONARY ANGIOGRAM N/A 11/09/2014   Procedure: LEFT HEART CATHETERIZATION WITH CORONARY ANGIOGRAM;  Surgeon: Laverda Page, MD;  Location: [facility] LAB;  Service: Cardiovascular;  Laterality: N/A;   LOWER EXTREMITY ANGIOGRAM Right 11/08/2015   Procedure: Lower Extremity Angiogram;  Surgeon: Serafina Mitchell, MD;  Location: [facility];  Service: Cardiovascular;  Laterality: Right;   LUMBAR LAMINECTOMY  06/13/2012   Procedure: MICRODISCECTOMY LUMBAR LAMINECTOMY;  Surgeon: Jessy Oto, MD;  Location: [facility];  Service: Orthopedics;  Laterality: N/A;  Central laminectomy L2-3, L3-4, L4-5   PACEMAKER INSERTION  10/08/2014   MDT Advisa MRI compatible dual chamber pacemaker implanted by Dr Caryl Comes for syncope with post-termination pauses   PERCUTANEOUS CORONARY STENT INTERVENTION (PCI-S)  11/09/2014   des to lad & distal circumflex         Dr  [name]   PERIPHERAL VASCULAR BALLOON ANGIOPLASTY Right 11/11/2019   Procedure: PERIPHERAL VASCULAR BALLOON ANGIOPLASTY;  Surgeon: Marty Heck, MD;  Location: [facility];  Service: Cardiovascular;  Laterality: Right;  below knee popliteal, tibioperoneal trunk, posterior tibial arteries   PERIPHERAL VASCULAR CATHETERIZATION N/A 11/08/2015   Procedure: Abdominal Aortogram;  Surgeon: Serafina Mitchell, MD;  Location: [facility];  Service: Cardiovascular;  Laterality: N/A;   PERIPHERAL VASCULAR CATHETERIZATION Right 11/08/2015   Procedure: Peripheral Vascular Atherectomy;  Surgeon: Serafina Mitchell, MD;  Location: [name];  Service: Cardiovascular;  Laterality: Right;   PERIPHERAL VASCULAR CATHETERIZATION N/A 10/10/2016   Procedure: Lower Extremity  Angiography;  Surgeon: Waynetta Sandy, MD;  Location: [facility];  Service: Cardiovascular;  Laterality: N/A;   PERIPHERAL VASCULAR CATHETERIZATION Right 10/10/2016   Procedure: Peripheral Vascular Atherectomy;  Surgeon: Waynetta Sandy, MD;  Location: [facility];  Service: Cardiovascular;  Laterality: Right;  Popliteal   PERMANENT PACEMAKER INSERTION N/A 10/08/2014   Procedure: PERMANENT PACEMAKER INSERTION;  Surgeon: Deboraha Sprang, MD;  Location: [facility] LAB;  Service: Cardiovascular;  Laterality: N/A;   RADIOLOGY WITH ANESTHESIA N/A 01/19/2022   Procedure: Disc Aspiration;  Surgeon: Pedro Earls, MD;  Location: [place] [facility];  Service: Radiology;  Laterality: N/A;   SUBMUCOSAL TATTOO INJECTION  07/20/2020   Procedure: SUBMUCOSAL TATTOO INJECTION;  Surgeon: Irving Copas., MD;  Location: [facility];  Service: Gastroenterology;;   TEE WITHOUT CARDIOVERSION N/A 10/27/2021   Procedure: TRANSESOPHAGEAL ECHOCARDIOGRAM (TEE);  Surgeon: Adrian Prows, MD;  Location: [facility]OSCOPY;  Service: Cardiovascular;  Laterality: N/A;   TEMPORARY PACEMAKER INSERTION Bilateral 10/07/2014   Procedure: TEMPORARY PACEMAKER INSERTION;  Surgeon: Laverda Page, MD;  Location: [facility] CATH LAB;  Service: Cardiovascular;  Laterality: Bilateral;   WOUND DEBRIDEMENT Right 02/19/2020   Procedure: DEBRIDEMENT WOUND RIGHT FOOT;  Surgeon: Angelia Mould, MD;  Location: [facility] OR;  Service: Vascular;  Laterality: Right;   Social History   Occupational History   Occupation: Disabled  Tobacco Use   Smoking status:  Some Days    Packs/day: 1.50    Years: 30.00    Pack years: 45.00    Types: Cigarettes    Last attempt to quit: 10/07/2014    Years since quitting: 7.5    Passive exposure: Current   Smokeless tobacco: Never   Tobacco comments:    Does an occasional cigar  Vaping Use   Vaping Use: Never used  Substance and Sexual Activity   Alcohol use: Not Currently     Alcohol/week: 21.0 standard drinks    Types: 21 Glasses of wine per week    Comment: cut way back, none in the last month   Drug use: No   Sexual activity: Not on file

## 2022-04-12 NOTE — Telephone Encounter (Signed)
Betadine okay to use per [name].

## 2022-04-13 DIAGNOSIS — D509 Iron deficiency anemia, unspecified: Secondary | ICD-10-CM | POA: Diagnosis not present

## 2022-04-13 DIAGNOSIS — Z992 Dependence on renal dialysis: Secondary | ICD-10-CM | POA: Diagnosis not present

## 2022-04-13 DIAGNOSIS — D631 Anemia in chronic kidney disease: Secondary | ICD-10-CM | POA: Diagnosis not present

## 2022-04-13 DIAGNOSIS — N2581 Secondary hyperparathyroidism of renal origin: Secondary | ICD-10-CM | POA: Diagnosis not present

## 2022-04-13 DIAGNOSIS — N186 End stage renal disease: Secondary | ICD-10-CM | POA: Diagnosis not present

## 2022-04-13 DIAGNOSIS — K31811 Angiodysplasia of stomach and duodenum with bleeding: Secondary | ICD-10-CM | POA: Diagnosis not present

## 2022-04-16 DIAGNOSIS — D509 Iron deficiency anemia, unspecified: Secondary | ICD-10-CM | POA: Diagnosis not present

## 2022-04-16 DIAGNOSIS — D631 Anemia in chronic kidney disease: Secondary | ICD-10-CM | POA: Diagnosis not present

## 2022-04-16 DIAGNOSIS — N2581 Secondary hyperparathyroidism of renal origin: Secondary | ICD-10-CM | POA: Diagnosis not present

## 2022-04-16 DIAGNOSIS — N186 End stage renal disease: Secondary | ICD-10-CM | POA: Diagnosis not present

## 2022-04-16 DIAGNOSIS — K31811 Angiodysplasia of stomach and duodenum with bleeding: Secondary | ICD-10-CM | POA: Diagnosis not present

## 2022-04-16 DIAGNOSIS — Z992 Dependence on renal dialysis: Secondary | ICD-10-CM | POA: Diagnosis not present

## 2022-04-17 DIAGNOSIS — N186 End stage renal disease: Secondary | ICD-10-CM | POA: Diagnosis not present

## 2022-04-17 DIAGNOSIS — T827XXD Infection and inflammatory reaction due to other cardiac and vascular devices, implants and grafts, subsequent encounter: Secondary | ICD-10-CM | POA: Diagnosis not present

## 2022-04-17 DIAGNOSIS — E1122 Type 2 diabetes mellitus with diabetic chronic kidney disease: Secondary | ICD-10-CM | POA: Diagnosis not present

## 2022-04-17 DIAGNOSIS — I509 Heart failure, unspecified: Secondary | ICD-10-CM | POA: Diagnosis not present

## 2022-04-18 DIAGNOSIS — D631 Anemia in chronic kidney disease: Secondary | ICD-10-CM | POA: Diagnosis not present

## 2022-04-18 DIAGNOSIS — K31811 Angiodysplasia of stomach and duodenum with bleeding: Secondary | ICD-10-CM | POA: Diagnosis not present

## 2022-04-18 DIAGNOSIS — Z992 Dependence on renal dialysis: Secondary | ICD-10-CM | POA: Diagnosis not present

## 2022-04-18 DIAGNOSIS — N186 End stage renal disease: Secondary | ICD-10-CM | POA: Diagnosis not present

## 2022-04-18 DIAGNOSIS — N2581 Secondary hyperparathyroidism of renal origin: Secondary | ICD-10-CM | POA: Diagnosis not present

## 2022-04-18 DIAGNOSIS — D509 Iron deficiency anemia, unspecified: Secondary | ICD-10-CM | POA: Diagnosis not present

## 2022-04-20 DIAGNOSIS — D509 Iron deficiency anemia, unspecified: Secondary | ICD-10-CM | POA: Diagnosis not present

## 2022-04-20 DIAGNOSIS — D631 Anemia in chronic kidney disease: Secondary | ICD-10-CM | POA: Diagnosis not present

## 2022-04-20 DIAGNOSIS — Z992 Dependence on renal dialysis: Secondary | ICD-10-CM | POA: Diagnosis not present

## 2022-04-20 DIAGNOSIS — K31811 Angiodysplasia of stomach and duodenum with bleeding: Secondary | ICD-10-CM | POA: Diagnosis not present

## 2022-04-20 DIAGNOSIS — N186 End stage renal disease: Secondary | ICD-10-CM | POA: Diagnosis not present

## 2022-04-20 DIAGNOSIS — N2581 Secondary hyperparathyroidism of renal origin: Secondary | ICD-10-CM | POA: Diagnosis not present

## 2022-04-22 ENCOUNTER — Encounter: Payer: Self-pay | Admitting: Orthopedic Surgery

## 2022-04-22 NOTE — Progress Notes (Signed)
Office Visit Note   Patient: Alexander [name]           Date of Birth: 14-Feb-1961           MRN: [id number] Visit Date: 03/15/2022              Requested by: [name], MD [address] [place],  [place] [address] PCP: [name] [name], MD  Chief Complaint  Patient presents with   Right Leg - Routine Post Op    01/31/22 right AKA   Left Foot - Wound Check    Left TMA      HPI: Patient is a 61 year old gentleman who is 6 weeks status post right above-knee amputation on 01/31/2022.  Patient is also status post left transmetatarsal amputation.  Assessment & Plan: Visit Diagnoses:  1. Unilateral AKA, right ([place])   2. S/P transmetatarsal amputation of foot, left ([place])     Plan: Discussed that we may need to consider an above-the-knee amputation on the left at follow-up.  Follow-Up Instructions: Return in about 4 weeks (around 04/12/2022).   Ortho Exam  Patient is alert, oriented, no adenopathy, well-dressed, normal affect, normal respiratory effort. Examination patient has black gangrenous changes to the left foot and left leg.  The left leg and knee are cold to touch.  There are dried black gangrenous ulcers in the left calf ankle and heel and gangrenous changes to the transmetatarsal amputation.  Imaging: No results found.    Labs: Lab Results  Component Value Date   HGBA1C 6.8 (H) 10/29/2021   HGBA1C 5.5 05/23/2020   HGBA1C 8.6 (H) 01/27/2020   ESRSEDRATE 25 (H) 01/17/2022   ESRSEDRATE 25 (H) 01/17/2022   ESRSEDRATE 30 (H) 01/16/2022   CRP 14.9 (H) 01/16/2022   CRP 14.0 (H) 01/15/2022   REPTSTATUS 01/24/2022 FINAL 01/19/2022   GRAMSTAIN  01/19/2022    RARE WBC PRESENT,BOTH PMN AND MONONUCLEAR NO ORGANISMS SEEN    CULT  01/19/2022    No growth aerobically or anaerobically. Performed at [facility], [place] [address] [place],  [address]    LABORGA STREPTOCOCCUS MITIS/ORALIS 01/26/2020     Lab Results  Component Value Date   ALBUMIN  2.3 (L) 02/13/2022   ALBUMIN 2.2 (L) 02/08/2022   ALBUMIN 2.1 (L) 02/06/2022    Lab Results  Component Value Date   MG 1.7 01/13/2022   MG 1.9 07/19/2020   MG 1.8 05/25/2020   No results found for: "VD25OH"  No results found for: "PREALBUMIN"    Latest Ref Rng & Units 02/13/2022    4:03 AM 02/08/2022    8:02 AM 02/06/2022    8:34 AM  CBC EXTENDED  WBC 4.0 - 10.5 K/uL 10.9  10.5  10.5   RBC 4.22 - 5.81 MIL/uL 3.55  3.42  3.50   Hemoglobin 13.0 - 17.0 g/dL 10.6  10.1  10.3   HCT 39.0 - 52.0 % 33.6  32.5  33.3   Platelets 150 - 400 K/uL 135  192  204   NEUT# 1.7 - 7.7 K/uL 6.6     Lymph# 0.7 - 4.0 K/uL 2.9        There is no height or weight on file to calculate BMI.  Orders:  No orders of the defined types were placed in this encounter.  Meds ordered this encounter  Medications   DISCONTD: oxyCODONE-acetaminophen (PERCOCET/ROXICET) 5-325 MG tablet    Sig: Take 1 tablet by mouth every 4 (four) hours as needed  for severe pain.    Dispense:  30 tablet    Refill:  0     Procedures: No procedures performed  Clinical Data: No additional findings.  ROS:  All other systems negative, except as noted in the HPI. Review of Systems  Objective: Vital Signs: There were no vitals taken for this visit.  Specialty Comments:  No specialty comments available.  PMFS History: Patient Active Problem List   Diagnosis Date Noted   Goals of care, counseling/discussion    Palliative care by specialist    Dehiscence of amputation stump of right lower extremity ([place])    Endocarditis 01/21/2022   Ulcer of right leg, limited to breakdown of skin ([place])    Below-knee amputation of right lower extremity ([place])    Ulcer of left foot, limited to breakdown of skin ([place])    History of transmetatarsal amputation of left foot ([place])    Lumbar back pain with radiculopathy affecting left lower extremity 01/14/2022   Dyslipidemia 01/14/2022   Type 2 diabetes mellitus with chronic kidney  disease, with long-term current use of insulin ([place]) 01/14/2022   GERD without esophagitis 01/14/2022   Pressure injury of skin 01/14/2022   Intractable back pain 01/13/2022   Blister 11/08/2021   Severe protein-calorie malnutrition ([place]) 11/02/2021   Pacemaker infection ([place])    Transaminitis    Tricuspid valve vegetation    Thrombocytopenia ([place]) 10/26/2021   Hyponatremia 10/26/2021   Acute on chronic diastolic CHF (congestive heart failure) ([place]) 10/26/2021   Atrial fibrillation with RVR ([place]) 10/25/2021   Symptomatic anemia 09/30/2021   ESRD on dialysis ([place]) 09/30/2021   PAF (paroxysmal atrial fibrillation) ([place]) 09/30/2021   NSVT (nonsustained ventricular tachycardia) ([place])    Pancytopenia ([place])    Acute blood loss anemia 07/18/2020   Hemodialysis patient ([place]) 06/04/2020   Necrotizing glomerulonephritis 06/04/2020   Heme positive stool    Melena    Acute GI bleeding 05/23/2020   Acute hematogenous osteomyelitis of right foot ([place]) 03/11/2020   Gangrene of right foot ([place])    Diabetic foot ulcer associated with type 2 diabetes mellitus ([place]) 02/19/2020   Heel ulcer ([place]) 11/05/2019   Gangrene of extremity due to atherosclerosis of artery ([place]) 09/09/2017   Essential hypertension, benign 02/07/2015   Renal insufficiency 02/07/2015   Post PTCA 11/09/2014   S/P PTCA (percutaneous transluminal coronary angioplasty) 11/09/2014   Pacemaker: [place] DR MRI [id number] Dual chamber pacemaker 10/08/2014 10/08/2014   Encounter for care of pacemaker 10/08/2014   Sinus node dysfunction ([place]) 10/08/2014   Cardiac asystole ([place]) 10/07/2014   Cardiac syncope 10/07/2014   Diabetes mellitus with stage 3 chronic kidney disease ([place]) 10/07/2014   CAD (coronary artery disease), native coronary artery 10/07/2014   Diabetes mellitus due to underlying condition without complications ([place]) 37/90/2409   Anemia, unspecified 05/25/2014   Syncope 04/30/2014   Atherosclerosis of native  arteries of the extremities with gangrene ([place]) 12/16/2013   Acute renal failure ([place]) 10/28/2013   PVD (peripheral vascular disease) ([place]) 10/27/2013   Leukocytosis 10/21/2013   Anemia 10/21/2013   Osteomyelitis of left foot ([place]) 08/19/2013   Spinal stenosis, lumbar region, with neurogenic claudication 06/12/2012    Class: Diagnosis of   Diabetes mellitus with end stage renal disease ([place]) 03/15/2009   ALCOHOL ABUSE 03/15/2009   TOBACCO USE 03/15/2009   HYPERTENSION 03/15/2009   Past Medical History:  Diagnosis Date   Acute blood loss anemia    Alcohol abuse    Anemia  Anemia of chronic disease    Arthritis    "patient does not think so."   Atrial fibrillation ([place])    Cardiac syncope 10/07/2014   rapid A fib with 8 sec pauses on converison with syncope- temp pacing wire placed then PPM   Cataract    BILATERAL   Coronary artery disease    Diabetes mellitus    dx---been  awhile   Elevated LFTs    Endocarditis of tricuspid valve    ESRD (end stage renal disease) ([place])    [place]   GERD (gastroesophageal reflux disease)    History of blood transfusion    Hyperlipemia    Hypertension    Malnutrition ([place])    Osteomyelitis (HCC)    right foot   Peripheral vascular disease ([place])    Presence of permanent cardiac pacemaker 10/08/2014   Medtronic   Tricuspid valve vegetation     Family History  Problem Relation Age of Onset   Diabetes type II Mother    Hypertension Mother    Diabetes Mother    Liver cancer Father    Diabetes type II Sister    Breast cancer Sister    Diabetes Sister    Hypertension Sister    Diabetes type II Brother    Kidney failure Brother    Diabetes Brother    Hypertension Brother    Diabetes type II Sister     Past Surgical History:  Procedure Laterality Date   A/V FISTULAGRAM Right 05/01/2021   Procedure: A/V FISTULAGRAM;  Surgeon: Waynetta Sandy, MD;  Location: [facility];  Service: Cardiovascular;   Laterality: Right;  failed fistulagram   ABDOMINAL AORTOGRAM W/LOWER EXTREMITY N/A 09/09/2017   Procedure: ABDOMINAL AORTOGRAM W/LOWER EXTREMITY;  Surgeon: Angelia Mould, MD;  Location: [facility];  Service: Cardiovascular;  Laterality: N/A;   ABDOMINAL AORTOGRAM W/LOWER EXTREMITY Right 11/11/2019   Procedure: ABDOMINAL AORTOGRAM W/LOWER EXTREMITY;  Surgeon: Marty Heck, MD;  Location: [facility];  Service: Cardiovascular;  Laterality: Right;   ABDOMINAL AORTOGRAM W/LOWER EXTREMITY N/A 01/26/2022   Procedure: ABDOMINAL AORTOGRAM W/LOWER EXTREMITY;  Surgeon: Adrian Prows, MD;  Location: [facility];  Service: Cardiovascular;  Laterality: N/A;   AMPUTATION Left 08/19/2013   Procedure: AMPUTATION RAY;  Surgeon: Alta Corning, MD;  Location: [facility];  Service: Orthopedics;  Laterality: Left;  ray amputation left 5th   AMPUTATION Left 10/27/2013   Procedure: AMPUTATION DIGIT-LEFT 4TH TOE, 4th and 5th metatarsal.;  Surgeon: Angelia Mould, MD;  Location: [place];  Service: Vascular;  Laterality: Left;   AMPUTATION Left 11/04/2013   Procedure: LEFT FOOT TRANS-METATARSAL AMPUTATION WITH WOUND CLOSURE ;  Surgeon: Alta Corning, MD;  Location: [facility];  Service: Orthopedics;  Laterality: Left;   AMPUTATION Right 09/10/2017   Procedure: RIGHT FOURTH AND FIFTH TOE AMPUTATION;  Surgeon: Angelia Mould, MD;  Location: [facility];  Service: Vascular;  Laterality: Right;   AMPUTATION Right 02/19/2020   Procedure: AMPUTATION RIGHT 3RD TOE;  Surgeon: Angelia Mould, MD;  Location: [facility];  Service: Vascular;  Laterality: Right;   AMPUTATION Right 03/11/2020   Procedure: RIGHT BELOW KNEE AMPUTATION;  Surgeon: Newt Minion, MD;  Location: [facility];  Service: Orthopedics;  Laterality: Right;   AMPUTATION Right 01/31/2022   Procedure: RIGHT ABOVE KNEE AMPUTATION;  Surgeon: Newt Minion, MD;  Location: [facility];  Service: Orthopedics;  Laterality: Right;   APPLICATION OF  WOUND  VAC Right 02/19/2020   Procedure: Application Of Wound Vac Right foot;  Surgeon: Angelia Mould, MD;  Location: [facility];  Service: Vascular;  Laterality: Right;   AV FISTULA PLACEMENT Right 03/27/2021   Procedure: RIGHT ARM ARTERIOVENOUS (AV) FISTULA;  Surgeon: Angelia Mould, MD;  Location: [facility];  Service: Vascular;  Laterality: Right;   AV FISTULA PLACEMENT Right 05/10/2021   Procedure: RIGHT ARM ARTERIOVENOUS (AV) FISTULA;  Surgeon: Waynetta Sandy, MD;  Location: [facility];  Service: Vascular;  Laterality: Right;   [place] Right 08/11/2021   Procedure: RIGHT ARM SECOND STAGE BASILIC VEIN TRANSPOSITION;  Surgeon: Waynetta Sandy, MD;  Location: [facility];  Service: Vascular;  Laterality: Right;   BIOPSY  07/20/2020   Procedure: BIOPSY;  Surgeon: Irving Copas., MD;  Location: [facility];  Service: Gastroenterology;;   CARDIAC CATHETERIZATION  10/07/2014   Procedure: LEFT HEART CATH AND CORONARY ANGIOGRAPHY;  Surgeon: Laverda Page, MD;  Location: [facility] LAB;  Service: Cardiovascular;;   COLONOSCOPY W/ POLYPECTOMY     COLONOSCOPY WITH PROPOFOL N/A 05/25/2020   Procedure: COLONOSCOPY WITH PROPOFOL;  Surgeon: Gatha Mayer, MD;  Location: [facility];  Service: Endoscopy;  Laterality: N/A;   EMBOLECTOMY Right 09/12/2017   Procedure: Thrombectomy  & Redo Right Below Knee Popliteal Artery Bypass Graft  ;  Surgeon: Waynetta Sandy, MD;  Location[facility];  Service: Vascular;  Laterality: Right;   ENTEROSCOPY N/A 07/20/2020   Procedure: ENTEROSCOPY;  Surgeon: Rush Landmark Telford Nab., MD;  Location: [facility];  Service: Gastroenterology;  Laterality: N/A;   ESOPHAGOGASTRODUODENOSCOPY (EGD) WITH PROPOFOL N/A 05/24/2020   Procedure: ESOPHAGOGASTRODUODENOSCOPY (EGD) WITH PROPOFOL;  Surgeon: Ladene Artist, MD;  Location: [facility];  Service: Endoscopy;  Laterality: N/A;   EYE SURGERY Bilateral     cataract   FEMORAL-TIBIAL BYPASS GRAFT Left 10/27/2013   Procedure: BYPASS GRAFT LEFT FEMORAL- POSTERIOR TIBIAL ARTERY;  Surgeon: Angelia Mould, MD;  Location: [place];  Service: Vascular;  Laterality: Left;   FEMORAL-TIBIAL BYPASS GRAFT Right 09/10/2017   Procedure: RIGHT SUPERFICIAL  FEMORAL ARTERY-BELOW KNEE POPLITEAL ARTERY BYPASS GRAFT WITH VEIN;  Surgeon: Angelia Mould, MD;  Location: [facility];  Service: Vascular;  Laterality: Right;   GIVENS CAPSULE STUDY N/A 07/20/2020   Procedure: GIVENS CAPSULE STUDY;  Surgeon: Irving Copas., MD;  Location: [facility];  Service: Gastroenterology;  Laterality: N/A;  capsule placed through scope into duodenum @ 0936   I & D EXTREMITY Left 10/27/2013   Procedure: IRRIGATION AND DEBRIDEMENT EXTREMITY- LEFT FOOT;  Surgeon: Angelia Mould, MD;  Location: [facility];  Service: Vascular;  Laterality: Left;   I & D EXTREMITY Right 01/26/2020   Procedure: IRRIGATION AND DEBRIDEMENT EXTREMITY RIGHT FOOT WOUND;  Surgeon: Angelia Mould, MD;  Location: [place];  Service: Vascular;  Laterality: Right;   INSERT / REPLACE / REMOVE PACEMAKER     INSERTION OF DIALYSIS CATHETER Right 11/03/2021   Procedure: RIGHT INTERNAL JUGULAR INSERTION OF TUNNELED DIALYSIS CATHETER;  Surgeon: Angelia Mould, MD;  Location: [facility];  Service: Vascular;  Laterality: Right;   INTRAOPERATIVE ARTERIOGRAM Right 09/10/2017   Procedure: INTRA OPERATIVE ARTERIOGRAM;  Surgeon: Angelia Mould, MD;  Location: [facility];  Service: Vascular;  Laterality: Right;   IR ANGIOGRAM FOLLOW UP STUDY  09/09/2017   IR FLUORO GUIDE CV LINE RIGHT  05/31/2020   IR LUMBAR [place] W/IMG GUIDE  01/19/2022   IR  US GUIDE VASC ACCESS RIGHT  06/01/2020   LEFT HEART CATH AND CORONARY ANGIOGRAPHY N/A 08/19/2020   Procedure: LEFT HEART CATH AND CORONARY ANGIOGRAPHY;  Surgeon: Adrian Prows, MD;  Location: [facility];  Service: Cardiovascular;  Laterality: N/A;    LEFT HEART CATHETERIZATION WITH CORONARY ANGIOGRAM N/A 11/09/2014   Procedure: LEFT HEART CATHETERIZATION WITH CORONARY ANGIOGRAM;  Surgeon: Laverda Page, MD;  Location: [facility] LAB;  Service: Cardiovascular;  Laterality: N/A;   LOWER EXTREMITY ANGIOGRAM Right 11/08/2015   Procedure: Lower Extremity Angiogram;  Surgeon: Serafina Mitchell, MD;  Location: [facility];  Service: Cardiovascular;  Laterality: Right;   LUMBAR LAMINECTOMY  06/13/2012   Procedure: MICRODISCECTOMY LUMBAR LAMINECTOMY;  Surgeon: Jessy Oto, MD;  Location: [facility];  Service: Orthopedics;  Laterality: N/A;  Central laminectomy L2-3, L3-4, L4-5   PACEMAKER INSERTION  10/08/2014   MDT Advisa MRI compatible dual chamber pacemaker implanted by Dr Caryl Comes for syncope with post-termination pauses   PERCUTANEOUS CORONARY STENT INTERVENTION (PCI-S)  11/09/2014   des to lad & distal circumflex         Dr  [name]   PERIPHERAL VASCULAR BALLOON ANGIOPLASTY Right 11/11/2019   Procedure: PERIPHERAL VASCULAR BALLOON ANGIOPLASTY;  Surgeon: Marty Heck, MD;  Location: [facility];  Service: Cardiovascular;  Laterality: Right;  below knee popliteal, tibioperoneal trunk, posterior tibial arteries   PERIPHERAL VASCULAR CATHETERIZATION N/A 11/08/2015   Procedure: Abdominal Aortogram;  Surgeon: Serafina Mitchell, MD;  Location: [facility];  Service: Cardiovascular;  Laterality: N/A;   PERIPHERAL VASCULAR CATHETERIZATION Right 11/08/2015   Procedure: Peripheral Vascular Atherectomy;  Surgeon: Serafina Mitchell, MD;  Location: [facility];  Service: Cardiovascular;  Laterality: Right;   PERIPHERAL VASCULAR CATHETERIZATION N/A 10/10/2016   Procedure: Lower Extremity Angiography;  Surgeon: Waynetta Sandy, MD;  Location: [facility];  Service: Cardiovascular;  Laterality: N/A;   PERIPHERAL VASCULAR CATHETERIZATION Right 10/10/2016   Procedure: Peripheral Vascular Atherectomy;  Surgeon: Waynetta Sandy, MD;  Location: [facility];  Service: Cardiovascular;  Laterality: Right;  Popliteal   PERMANENT PACEMAKER INSERTION N/A 10/08/2014   Procedure: PERMANENT PACEMAKER INSERTION;  Surgeon: Deboraha Sprang, MD;  Location: [facility] CATH LAB;  Service: Cardiovascular;  Laterality: N/A;   RADIOLOGY WITH ANESTHESIA N/A 01/19/2022   Procedure: Disc Aspiration;  Surgeon: Pedro Earls, MD;  Location: [facility];  Service: Radiology;  Laterality: N/A;   SUBMUCOSAL TATTOO INJECTION  07/20/2020   Procedure: SUBMUCOSAL TATTOO INJECTION;  Surgeon: Irving Copas., MD;  Location: [place];  Service: Gastroenterology;;   TEE WITHOUT CARDIOVERSION N/A 10/27/2021   Procedure: TRANSESOPHAGEAL ECHOCARDIOGRAM (TEE);  Surgeon: Adrian Prows, MD;  Location: [facility] ENDOSCOPY;  Service: Cardiovascular;  Laterality: N/A;   TEMPORARY PACEMAKER INSERTION Bilateral 10/07/2014   Procedure: TEMPORARY PACEMAKER INSERTION;  Surgeon: Laverda Page, MD;  Location: [facility] CATH LAB;  Service: Cardiovascular;  Laterality: Bilateral;   WOUND DEBRIDEMENT Right 02/19/2020   Procedure: DEBRIDEMENT WOUND RIGHT FOOT;  Surgeon: Angelia Mould, MD;  Location: [facility] OR;  Service: Vascular;  Laterality: Right;   Social History   Occupational History   Occupation: Disabled  Tobacco Use   Smoking status: Some Days    Packs/day: 1.50    Years: 30.00    Total pack years: 45.00    Types: Cigarettes    Last attempt to quit: 10/07/2014    Years since quitting: 7.5    Passive exposure:  Current   Smokeless tobacco: Never   Tobacco comments:    Does an occasional cigar  Vaping Use   Vaping Use: Never used  Substance and Sexual Activity   Alcohol use: Not Currently    Alcohol/week: 21.0 standard drinks of alcohol    Types: 21 Glasses of wine per week    Comment: cut way back, none in the last month   Drug use: No   Sexual activity: Not on file

## 2022-04-23 DIAGNOSIS — K31811 Angiodysplasia of stomach and duodenum with bleeding: Secondary | ICD-10-CM | POA: Diagnosis not present

## 2022-04-23 DIAGNOSIS — N186 End stage renal disease: Secondary | ICD-10-CM | POA: Diagnosis not present

## 2022-04-23 DIAGNOSIS — D631 Anemia in chronic kidney disease: Secondary | ICD-10-CM | POA: Diagnosis not present

## 2022-04-23 DIAGNOSIS — D509 Iron deficiency anemia, unspecified: Secondary | ICD-10-CM | POA: Diagnosis not present

## 2022-04-23 DIAGNOSIS — Z992 Dependence on renal dialysis: Secondary | ICD-10-CM | POA: Diagnosis not present

## 2022-04-23 DIAGNOSIS — N2581 Secondary hyperparathyroidism of renal origin: Secondary | ICD-10-CM | POA: Diagnosis not present

## 2022-04-24 DIAGNOSIS — I5022 Chronic systolic (congestive) heart failure: Secondary | ICD-10-CM | POA: Diagnosis not present

## 2022-04-25 DIAGNOSIS — N2581 Secondary hyperparathyroidism of renal origin: Secondary | ICD-10-CM | POA: Diagnosis not present

## 2022-04-25 DIAGNOSIS — N186 End stage renal disease: Secondary | ICD-10-CM | POA: Diagnosis not present

## 2022-04-25 DIAGNOSIS — Z992 Dependence on renal dialysis: Secondary | ICD-10-CM | POA: Diagnosis not present

## 2022-04-25 DIAGNOSIS — D509 Iron deficiency anemia, unspecified: Secondary | ICD-10-CM | POA: Diagnosis not present

## 2022-04-25 DIAGNOSIS — K31811 Angiodysplasia of stomach and duodenum with bleeding: Secondary | ICD-10-CM | POA: Diagnosis not present

## 2022-04-25 DIAGNOSIS — D631 Anemia in chronic kidney disease: Secondary | ICD-10-CM | POA: Diagnosis not present

## 2022-04-26 DIAGNOSIS — M4807 Spinal stenosis, lumbosacral region: Secondary | ICD-10-CM | POA: Diagnosis not present

## 2022-04-26 DIAGNOSIS — E43 Unspecified severe protein-calorie malnutrition: Secondary | ICD-10-CM | POA: Diagnosis not present

## 2022-04-26 DIAGNOSIS — D631 Anemia in chronic kidney disease: Secondary | ICD-10-CM | POA: Diagnosis not present

## 2022-04-26 DIAGNOSIS — N186 End stage renal disease: Secondary | ICD-10-CM | POA: Diagnosis not present

## 2022-04-26 DIAGNOSIS — L97828 Non-pressure chronic ulcer of other part of left lower leg with other specified severity: Secondary | ICD-10-CM | POA: Diagnosis not present

## 2022-04-26 DIAGNOSIS — E1151 Type 2 diabetes mellitus with diabetic peripheral angiopathy without gangrene: Secondary | ICD-10-CM | POA: Diagnosis not present

## 2022-04-26 DIAGNOSIS — I480 Paroxysmal atrial fibrillation: Secondary | ICD-10-CM | POA: Diagnosis not present

## 2022-04-26 DIAGNOSIS — E785 Hyperlipidemia, unspecified: Secondary | ICD-10-CM | POA: Diagnosis not present

## 2022-04-26 DIAGNOSIS — K219 Gastro-esophageal reflux disease without esophagitis: Secondary | ICD-10-CM | POA: Diagnosis not present

## 2022-04-26 DIAGNOSIS — I2510 Atherosclerotic heart disease of native coronary artery without angina pectoris: Secondary | ICD-10-CM | POA: Diagnosis not present

## 2022-04-26 DIAGNOSIS — I132 Hypertensive heart and chronic kidney disease with heart failure and with stage 5 chronic kidney disease, or end stage renal disease: Secondary | ICD-10-CM | POA: Diagnosis not present

## 2022-04-26 DIAGNOSIS — L89150 Pressure ulcer of sacral region, unstageable: Secondary | ICD-10-CM | POA: Diagnosis not present

## 2022-04-26 DIAGNOSIS — E1122 Type 2 diabetes mellitus with diabetic chronic kidney disease: Secondary | ICD-10-CM | POA: Diagnosis not present

## 2022-04-26 DIAGNOSIS — I5022 Chronic systolic (congestive) heart failure: Secondary | ICD-10-CM | POA: Diagnosis not present

## 2022-04-26 DIAGNOSIS — E11622 Type 2 diabetes mellitus with other skin ulcer: Secondary | ICD-10-CM | POA: Diagnosis not present

## 2022-04-26 DIAGNOSIS — I442 Atrioventricular block, complete: Secondary | ICD-10-CM | POA: Diagnosis not present

## 2022-04-27 DIAGNOSIS — D509 Iron deficiency anemia, unspecified: Secondary | ICD-10-CM | POA: Diagnosis not present

## 2022-04-27 DIAGNOSIS — N186 End stage renal disease: Secondary | ICD-10-CM | POA: Diagnosis not present

## 2022-04-27 DIAGNOSIS — Z992 Dependence on renal dialysis: Secondary | ICD-10-CM | POA: Diagnosis not present

## 2022-04-27 DIAGNOSIS — D631 Anemia in chronic kidney disease: Secondary | ICD-10-CM | POA: Diagnosis not present

## 2022-04-27 DIAGNOSIS — K31811 Angiodysplasia of stomach and duodenum with bleeding: Secondary | ICD-10-CM | POA: Diagnosis not present

## 2022-04-27 DIAGNOSIS — N2581 Secondary hyperparathyroidism of renal origin: Secondary | ICD-10-CM | POA: Diagnosis not present

## 2022-04-30 DIAGNOSIS — N2581 Secondary hyperparathyroidism of renal origin: Secondary | ICD-10-CM | POA: Diagnosis not present

## 2022-04-30 DIAGNOSIS — N186 End stage renal disease: Secondary | ICD-10-CM | POA: Diagnosis not present

## 2022-04-30 DIAGNOSIS — K31811 Angiodysplasia of stomach and duodenum with bleeding: Secondary | ICD-10-CM | POA: Diagnosis not present

## 2022-04-30 DIAGNOSIS — D631 Anemia in chronic kidney disease: Secondary | ICD-10-CM | POA: Diagnosis not present

## 2022-04-30 DIAGNOSIS — Z992 Dependence on renal dialysis: Secondary | ICD-10-CM | POA: Diagnosis not present

## 2022-04-30 DIAGNOSIS — D509 Iron deficiency anemia, unspecified: Secondary | ICD-10-CM | POA: Diagnosis not present

## 2022-05-02 DIAGNOSIS — N186 End stage renal disease: Secondary | ICD-10-CM | POA: Diagnosis not present

## 2022-05-02 DIAGNOSIS — N2581 Secondary hyperparathyroidism of renal origin: Secondary | ICD-10-CM | POA: Diagnosis not present

## 2022-05-02 DIAGNOSIS — D631 Anemia in chronic kidney disease: Secondary | ICD-10-CM | POA: Diagnosis not present

## 2022-05-02 DIAGNOSIS — D509 Iron deficiency anemia, unspecified: Secondary | ICD-10-CM | POA: Diagnosis not present

## 2022-05-02 DIAGNOSIS — K31811 Angiodysplasia of stomach and duodenum with bleeding: Secondary | ICD-10-CM | POA: Diagnosis not present

## 2022-05-02 DIAGNOSIS — Z992 Dependence on renal dialysis: Secondary | ICD-10-CM | POA: Diagnosis not present

## 2022-05-04 DIAGNOSIS — K31811 Angiodysplasia of stomach and duodenum with bleeding: Secondary | ICD-10-CM | POA: Diagnosis not present

## 2022-05-04 DIAGNOSIS — L89150 Pressure ulcer of sacral region, unstageable: Secondary | ICD-10-CM | POA: Diagnosis not present

## 2022-05-04 DIAGNOSIS — N186 End stage renal disease: Secondary | ICD-10-CM | POA: Diagnosis not present

## 2022-05-04 DIAGNOSIS — D509 Iron deficiency anemia, unspecified: Secondary | ICD-10-CM | POA: Diagnosis not present

## 2022-05-04 DIAGNOSIS — Z992 Dependence on renal dialysis: Secondary | ICD-10-CM | POA: Diagnosis not present

## 2022-05-04 DIAGNOSIS — N2581 Secondary hyperparathyroidism of renal origin: Secondary | ICD-10-CM | POA: Diagnosis not present

## 2022-05-04 DIAGNOSIS — D631 Anemia in chronic kidney disease: Secondary | ICD-10-CM | POA: Diagnosis not present

## 2022-05-04 DIAGNOSIS — E1152 Type 2 diabetes mellitus with diabetic peripheral angiopathy with gangrene: Secondary | ICD-10-CM | POA: Diagnosis not present

## 2022-05-07 DIAGNOSIS — N186 End stage renal disease: Secondary | ICD-10-CM | POA: Diagnosis not present

## 2022-05-07 DIAGNOSIS — N2581 Secondary hyperparathyroidism of renal origin: Secondary | ICD-10-CM | POA: Diagnosis not present

## 2022-05-07 DIAGNOSIS — D631 Anemia in chronic kidney disease: Secondary | ICD-10-CM | POA: Diagnosis not present

## 2022-05-07 DIAGNOSIS — D509 Iron deficiency anemia, unspecified: Secondary | ICD-10-CM | POA: Diagnosis not present

## 2022-05-07 DIAGNOSIS — Z992 Dependence on renal dialysis: Secondary | ICD-10-CM | POA: Diagnosis not present

## 2022-05-07 DIAGNOSIS — K31811 Angiodysplasia of stomach and duodenum with bleeding: Secondary | ICD-10-CM | POA: Diagnosis not present

## 2022-05-08 ENCOUNTER — Ambulatory Visit (INDEPENDENT_AMBULATORY_CARE_PROVIDER_SITE_OTHER): Payer: Medicare Other | Admitting: Orthopedic Surgery

## 2022-05-08 DIAGNOSIS — S78111A Complete traumatic amputation at level between right hip and knee, initial encounter: Secondary | ICD-10-CM

## 2022-05-08 DIAGNOSIS — I96 Gangrene, not elsewhere classified: Secondary | ICD-10-CM | POA: Diagnosis not present

## 2022-05-08 DIAGNOSIS — Z89432 Acquired absence of left foot: Secondary | ICD-10-CM

## 2022-05-08 DIAGNOSIS — Z89611 Acquired absence of right leg above knee: Secondary | ICD-10-CM | POA: Diagnosis not present

## 2022-05-09 ENCOUNTER — Encounter: Payer: Self-pay | Admitting: Orthopedic Surgery

## 2022-05-09 DIAGNOSIS — N2581 Secondary hyperparathyroidism of renal origin: Secondary | ICD-10-CM | POA: Diagnosis not present

## 2022-05-09 DIAGNOSIS — K31811 Angiodysplasia of stomach and duodenum with bleeding: Secondary | ICD-10-CM | POA: Diagnosis not present

## 2022-05-09 DIAGNOSIS — N186 End stage renal disease: Secondary | ICD-10-CM | POA: Diagnosis not present

## 2022-05-09 DIAGNOSIS — D509 Iron deficiency anemia, unspecified: Secondary | ICD-10-CM | POA: Diagnosis not present

## 2022-05-09 DIAGNOSIS — Z992 Dependence on renal dialysis: Secondary | ICD-10-CM | POA: Diagnosis not present

## 2022-05-09 DIAGNOSIS — D631 Anemia in chronic kidney disease: Secondary | ICD-10-CM | POA: Diagnosis not present

## 2022-05-09 NOTE — Progress Notes (Signed)
Office Visit Note   Patient: Alexander [name]           Date of Birth: 25-Jul-1961           MRN: [id number] Visit Date: 05/08/2022              Requested by: [name], MD [address] [place],  [place] [address] PCP: [name], MD  Chief Complaint  Patient presents with   Right Leg - Follow-up    01/31/22   Left Foot - Follow-up    Left transmet amputation      HPI: Patient is a 61 year old gentleman who is 3 months status post right above-knee amputation and recently status post left transmetatarsal amputation.  Patient has had difficulty with wound healing and has had progressive gangrenous changes to the left lower extremity.  Patient receives dialysis Monday Wednesday Friday.  Assessment & Plan: Visit Diagnoses:  1. Unilateral AKA, right ([place])   2. S/P transmetatarsal amputation of foot, left ([place])   3. Gangrene of left foot ([place])     Plan: With the progressive gangrenous changes and abscess of the left heel and necrotic ulcers of the calf I have recommended proceeding with a transtibial amputation.  Discussed that there is risk of this progressing to an above-the-knee amputation.  Patient states he understands wished to proceed at this time he is currently on antibiotics.  We will plan for surgery on Friday if his electrolytes are not acceptable at that time we will plan for dialysis on Friday at the hospital and then surgery on Saturday.  Follow-Up Instructions: Return in about 2 weeks (around 05/22/2022).   Ortho Exam  Patient is alert, oriented, no adenopathy, well-dressed, normal affect, normal respiratory effort. Examination patient has a purulent draining abscess of the left heel.  There is necrotic ulcers in the calf with drainage.  The more proximal calf ulcers have healed.  Imaging: No results found.     Labs: Lab Results  Component Value Date   HGBA1C 6.8 (H) 10/29/2021   HGBA1C 5.5 05/23/2020   HGBA1C 8.6 (H) 01/27/2020   ESRSEDRATE  25 (H) 01/17/2022   ESRSEDRATE 25 (H) 01/17/2022   ESRSEDRATE 30 (H) 01/16/2022   CRP 14.9 (H) 01/16/2022   CRP 14.0 (H) 01/15/2022   REPTSTATUS 01/24/2022 FINAL 01/19/2022   GRAMSTAIN  01/19/2022    RARE WBC PRESENT,BOTH PMN AND MONONUCLEAR NO ORGANISMS SEEN    CULT  01/19/2022    No growth aerobically or anaerobically. Performed at [facility], [place] [address], [place], [place] [address]    LABORGA STREPTOCOCCUS MITIS/ORALIS 01/26/2020     Lab Results  Component Value Date   ALBUMIN 2.3 (L) 02/13/2022   ALBUMIN 2.2 (L) 02/08/2022   ALBUMIN 2.1 (L) 02/06/2022    Lab Results  Component Value Date   MG 1.7 01/13/2022   MG 1.9 07/19/2020   MG 1.8 05/25/2020   No results found for: "VD25OH"  No results found for: "PREALBUMIN"    Latest Ref Rng & Units 02/13/2022    4:03 AM 02/08/2022    8:02 AM 02/06/2022    8:34 AM  CBC EXTENDED  WBC 4.0 - 10.5 K/uL 10.9  10.5  10.5   RBC 4.22 - 5.81 MIL/uL 3.55  3.42  3.50   Hemoglobin 13.0 - 17.0 g/dL 10.6  10.1  10.3   HCT 39.0 - 52.0 % 33.6  32.5  33.3   Platelets 150 - 400 K/uL 135  192  204   NEUT# 1.7 - 7.7 K/uL 6.6     Lymph# 0.7 - 4.0 K/uL 2.9        There is no height or weight on file to calculate BMI.  Orders:  No orders of the defined types were placed in this encounter.  No orders of the defined types were placed in this encounter.    Procedures: No procedures performed  Clinical Data: No additional findings.  ROS:  All other systems negative, except as noted in the HPI. Review of Systems  Objective: Vital Signs: There were no vitals taken for this visit.  Specialty Comments:  No specialty comments available.  PMFS History: Patient Active Problem List   Diagnosis Date Noted   Goals of care, counseling/discussion    Palliative care by specialist    Dehiscence of amputation stump of right lower extremity ([place])    Endocarditis 01/21/2022   Ulcer of right leg, limited to breakdown of skin  ([place])    Below-knee amputation of right lower extremity ([place])    Ulcer of left foot, limited to breakdown of skin ([place])    History of transmetatarsal amputation of left foot ([place])    Lumbar back pain with radiculopathy affecting left lower extremity 01/14/2022   Dyslipidemia 01/14/2022   Type 2 diabetes mellitus with chronic kidney disease, with long-term current use of insulin ([place]) 01/14/2022   GERD without esophagitis 01/14/2022   Pressure injury of skin 01/14/2022   Intractable back pain 01/13/2022   Blister 11/08/2021   Severe protein-calorie malnutrition ([place]) 11/02/2021   Pacemaker infection ([place])    Transaminitis    Tricuspid valve vegetation    Thrombocytopenia ([place]) 10/26/2021   Hyponatremia 10/26/2021   Acute on chronic diastolic CHF (congestive heart failure) ([place]) 10/26/2021   Atrial fibrillation with RVR ([place]) 10/25/2021   Symptomatic anemia 09/30/2021   ESRD on dialysis ([place]) 09/30/2021   PAF (paroxysmal atrial fibrillation) ([place]) 09/30/2021   NSVT (nonsustained ventricular tachycardia) ([place])    Pancytopenia ([place])    Acute blood loss anemia 07/18/2020   Hemodialysis patient ([place]) 06/04/2020   Necrotizing glomerulonephritis 06/04/2020   Heme positive stool    Melena    Acute GI bleeding 05/23/2020   Acute hematogenous osteomyelitis of right foot ([place]) 03/11/2020   Gangrene of right foot ([place])    Diabetic foot ulcer associated with type 2 diabetes mellitus ([place]) 02/19/2020   Heel ulcer ([place]) 11/05/2019   Gangrene of extremity due to atherosclerosis of artery ([place]) 09/09/2017   Essential hypertension, benign 02/07/2015   Renal insufficiency 02/07/2015   Post PTCA 11/09/2014   S/P PTCA (percutaneous transluminal coronary angioplasty) 11/09/2014   Pacemaker: [place] DR MRI [id number] Dual chamber pacemaker 10/08/2014 10/08/2014   Encounter for care of pacemaker 10/08/2014   Sinus node dysfunction ([place]) 10/08/2014   Cardiac asystole ([place]) 10/07/2014   Cardiac  syncope 10/07/2014   Diabetes mellitus with stage 3 chronic kidney disease ([place]) 10/07/2014   CAD (coronary artery disease), native coronary artery 10/07/2014   Diabetes mellitus due to underlying condition without complications ([place]) 63/87/5643   Anemia, unspecified 05/25/2014   Syncope 04/30/2014   Atherosclerosis of native arteries of the extremities with gangrene ([place]) 12/16/2013   Acute renal failure ([place]) 10/28/2013   PVD (peripheral vascular disease) ([place]) 10/27/2013   Leukocytosis 10/21/2013   Anemia 10/21/2013   Osteomyelitis of left foot (Al[place]) 08/19/2013   Spinal stenosis, lumbar region, with neurogenic claudication 06/12/2012    Class: Diagnosis of   Diabetes  mellitus with end stage renal disease ([place]) 03/15/2009   ALCOHOL ABUSE 03/15/2009   TOBACCO USE 03/15/2009   HYPERTENSION 03/15/2009   Past Medical History:  Diagnosis Date   Acute blood loss anemia    Alcohol abuse    Anemia    Anemia of chronic disease    Arthritis    "patient does not think so."   Atrial fibrillation ([place])    Cardiac syncope 10/07/2014   rapid A fib with 8 sec pauses on converison with syncope- temp pacing wire placed then PPM   Cataract    BILATERAL   Coronary artery disease    Diabetes mellitus    dx---been  awhile   Elevated LFTs    Endocarditis of tricuspid valve    ESRD (end stage renal disease) ([place])    [place]   GERD (gastroesophageal reflux disease)    History of blood transfusion    Hyperlipemia    Hypertension    Malnutrition ([place])    Osteomyelitis (HCC)    right foot   Peripheral vascular disease ([place])    Presence of permanent cardiac pacemaker 10/08/2014   Medtronic   Tricuspid valve vegetation     Family History  Problem Relation Age of Onset   Diabetes type II Mother    Hypertension Mother    Diabetes Mother    Liver cancer Father    Diabetes type II Sister    Breast cancer Sister    Diabetes Sister    Hypertension Sister    Diabetes type  II Brother    Kidney failure Brother    Diabetes Brother    Hypertension Brother    Diabetes type II Sister     Past Surgical History:  Procedure Laterality Date   A/V FISTULAGRAM Right 05/01/2021   Procedure: A/V FISTULAGRAM;  Surgeon: Waynetta Sandy, MD;  Location: [facility];  Service: Cardiovascular;  Laterality: Right;  failed fistulagram   ABDOMINAL AORTOGRAM W/LOWER EXTREMITY N/A 09/09/2017   Procedure: ABDOMINAL AORTOGRAM W/LOWER EXTREMITY;  Surgeon: Angelia Mould, MD;  Location: [facility];  Service: Cardiovascular;  Laterality: N/A;   ABDOMINAL AORTOGRAM W/LOWER EXTREMITY Right 11/11/2019   Procedure: ABDOMINAL AORTOGRAM W/LOWER EXTREMITY;  Surgeon: Marty Heck, MD;  Location: [facility];  Service: Cardiovascular;  Laterality: Right;   ABDOMINAL AORTOGRAM W/LOWER EXTREMITY N/A 01/26/2022   Procedure: ABDOMINAL AORTOGRAM W/LOWER EXTREMITY;  Surgeon: Adrian Prows, MD;  Location: [facility];  Service: Cardiovascular;  Laterality: N/A;   AMPUTATION Left 08/19/2013   Procedure: AMPUTATION RAY;  Surgeon: Alta Corning, MD;  Location: [facility];  Service: Orthopedics;  Laterality: Left;  ray amputation left 5th   AMPUTATION Left 10/27/2013   Procedure: AMPUTATION DIGIT-LEFT 4TH TOE, 4th and 5th metatarsal.;  Surgeon: Angelia Mould, MD;  Location: [facility];  Service: Vascular;  Laterality: Left;   AMPUTATION Left 11/04/2013   Procedure: LEFT FOOT TRANS-METATARSAL AMPUTATION WITH WOUND CLOSURE ;  Surgeon: Alta Corning, MD;  Location: [facility];  Service: Orthopedics;  Laterality: Left;   AMPUTATION Right 09/10/2017   Procedure: RIGHT FOURTH AND FIFTH TOE AMPUTATION;  Surgeon: Angelia Mould, MD;  Location: [place] [facility];  Service: Vascular;  Laterality: Right;   AMPUTATION Right 02/19/2020   Procedure: AMPUTATION RIGHT 3RD TOE;  Surgeon: Angelia Mould, MD;  Location: [facility];  Service: Vascular;  Laterality: Right;   AMPUTATION  Right 03/11/2020   Procedure: RIGHT BELOW KNEE AMPUTATION;  Surgeon: Newt Minion, MD;  Location: [facility];  Service: Orthopedics;  Laterality: Right;   AMPUTATION Right 01/31/2022   Procedure: RIGHT ABOVE KNEE AMPUTATION;  Surgeon: Newt Minion, MD;  Location: [facility];  Service: Orthopedics;  Laterality: Right;   APPLICATION OF WOUND VAC Right 02/19/2020   Procedure: Application Of Wound Vac Right foot;  Surgeon: Angelia Mould, MD;  Location: [place];  Service: Vascular;  Laterality: Right;   AV FISTULA PLACEMENT Right 03/27/2021   Procedure: RIGHT ARM ARTERIOVENOUS (AV) FISTULA;  Surgeon: Angelia Mould, MD;  Location: [facility];  Service: Vascular;  Laterality: Right;   AV FISTULA PLACEMENT Right 05/10/2021   Procedure: RIGHT ARM ARTERIOVENOUS (AV) FISTULA;  Surgeon: Waynetta Sandy, MD;  Location: [facility];  Service: Vascular;  Laterality: Right;   [place] Right 08/11/2021   Procedure: RIGHT ARM SECOND STAGE BASILIC VEIN TRANSPOSITION;  Surgeon: Waynetta Sandy, MD;  Location: [facility] [facility];  Service: Vascular;  Laterality: Right;   BIOPSY  07/20/2020   Procedure: BIOPSY;  Surgeon: Irving Copas., MD;  Location: [facility];  Service: Gastroenterology;;   CARDIAC CATHETERIZATION  10/07/2014   Procedure: LEFT HEART CATH AND CORONARY ANGIOGRAPHY;  Surgeon: Laverda Page, MD;  Location: [facility];  Service: Cardiovascular;;   COLONOSCOPY W/ POLYPECTOMY     COLONOSCOPY WITH PROPOFOL N/A 05/25/2020   Procedure: COLONOSCOPY WITH PROPOFOL;  Surgeon: Gatha Mayer, MD;  Location: [facility];  Service: Endoscopy;  Laterality: N/A;   EMBOLECTOMY Right 09/12/2017   Procedure: Thrombectomy  & Redo Right Below Knee Popliteal Artery Bypass Graft  ;  Surgeon: Waynetta Sandy, MD;  [facility];  Service: Vascular;  Laterality: Right;   ENTEROSCOPY N/A 07/20/2020   Procedure: ENTEROSCOPY;  Surgeon: Rush Landmark Telford Nab., MD;  Location: [facility];  Service: Gastroenterology;  Laterality: N/A;   ESOPHAGOGASTRODUODENOSCOPY (EGD) WITH PROPOFOL N/A 05/24/2020   Procedure: ESOPHAGOGASTRODUODENOSCOPY (EGD) WITH PROPOFOL;  Surgeon: Ladene Artist, MD;  Location: [facility];  Service: Endoscopy;  Laterality: N/A;   EYE SURGERY Bilateral    cataract   FEMORAL-TIBIAL BYPASS GRAFT Left 10/27/2013   Procedure: BYPASS GRAFT LEFT FEMORAL- POSTERIOR TIBIAL ARTERY;  Surgeon: Angelia Mould, MD;  Location: [facility];  Service: Vascular;  Laterality: Left;   FEMORAL-TIBIAL BYPASS GRAFT Right 09/10/2017   Procedure: RIGHT SUPERFICIAL  FEMORAL ARTERY-BELOW KNEE POPLITEAL ARTERY BYPASS GRAFT WITH VEIN;  Surgeon: Angelia Mould, MD;  Location: [facility];  Service: Vascular;  Laterality: Right;   GIVENS CAPSULE STUDY N/A 07/20/2020   Procedure: GIVENS CAPSULE STUDY;  Surgeon: Irving Copas., MD;  Location: [facility];  Service: Gastroenterology;  Laterality: N/A;  capsule placed through scope into duodenum @ 0936   I & D EXTREMITY Left 10/27/2013   Procedure: IRRIGATION AND DEBRIDEMENT EXTREMITY- LEFT FOOT;  Surgeon: Angelia Mould, MD;  Location: [facility];  Service: Vascular;  Laterality: Left;   I & D EXTREMITY Right 01/26/2020   Procedure: IRRIGATION AND DEBRIDEMENT EXTREMITY RIGHT FOOT WOUND;  Surgeon: Angelia Mould, MD;  Location: [facility];  Service: Vascular;  Laterality: Right;   INSERT / REPLACE / [place] Right 11/03/2021   Procedure: RIGHT INTERNAL JUGULAR INSERTION OF TUNNELED DIALYSIS CATHETER;  Surgeon: Angelia Mould, MD;  Location: [facility];  Service: Vascular;  Laterality: Right;   INTRAOPERATIVE ARTERIOGRAM Right 09/10/2017   Procedure: INTRA OPERATIVE ARTERIOGRAM;  Surgeon: Angelia Mould, MD;  Location: [facility] OR;  Service: Vascular;  Laterality: Right;   IR ANGIOGRAM FOLLOW UP STUDY  09/09/2017   IR FLUORO GUIDE CV LINE RIGHT   05/31/2020   IR LUMBAR DISC ASPIRATION W/IMG GUIDE  01/19/2022   IR US GUIDE VASC ACCESS RIGHT  06/01/2020   LEFT HEART CATH AND CORONARY ANGIOGRAPHY N/A 08/19/2020   Procedure: LEFT HEART CATH AND CORONARY ANGIOGRAPHY;  Surgeon: Adrian Prows, MD;  Location: [facility];  Service: Cardiovascular;  Laterality: N/A;   LEFT HEART CATHETERIZATION WITH CORONARY ANGIOGRAM N/A 11/09/2014   Procedure: LEFT HEART CATHETERIZATION WITH CORONARY ANGIOGRAM;  Surgeon: Laverda Page, MD;  Location: [facility] CATH LAB;  Service: Cardiovascular;  Laterality: N/A;   LOWER EXTREMITY ANGIOGRAM Right 11/08/2015   Procedure: Lower Extremity Angiogram;  Surgeon: Serafina Mitchell, MD;  Location: [facility];  Service: Cardiovascular;  Laterality: Right;   LUMBAR LAMINECTOMY  06/13/2012   Procedure: MICRODISCECTOMY LUMBAR LAMINECTOMY;  Surgeon: Jessy Oto, MD;  Location: [facility];  Service: Orthopedics;  Laterality: N/A;  Central laminectomy L2-3, L3-4, L4-5   PACEMAKER INSERTION  10/08/2014   MDT Advisa MRI compatible dual chamber pacemaker implanted by Dr Caryl Comes for syncope with post-termination pauses   PERCUTANEOUS CORONARY STENT INTERVENTION (PCI-S)  11/09/2014   des to lad & distal circumflex         Dr  [name]   PERIPHERAL VASCULAR BALLOON ANGIOPLASTY Right 11/11/2019   Procedure: PERIPHERAL VASCULAR BALLOON ANGIOPLASTY;  Surgeon: Marty Heck, MD;  Location: [facility];  Service: Cardiovascular;  Laterality: Right;  below knee popliteal, tibioperoneal trunk, posterior tibial arteries   PERIPHERAL VASCULAR CATHETERIZATION N/A 11/08/2015   Procedure: Abdominal Aortogram;  Surgeon: Serafina Mitchell, MD;  Location: [facility];  Service: Cardiovascular;  Laterality: N/A;   PERIPHERAL VASCULAR CATHETERIZATION Right 11/08/2015   Procedure: Peripheral Vascular Atherectomy;  Surgeon: Serafina Mitchell, MD;  Location: [facility];  Service: Cardiovascular;  Laterality: Right;   PERIPHERAL  VASCULAR CATHETERIZATION N/A 10/10/2016   Procedure: Lower Extremity Angiography;  Surgeon: Waynetta Sandy, MD;  Location: [facility];  Service: Cardiovascular;  Laterality: N/A;   PERIPHERAL VASCULAR CATHETERIZATION Right 10/10/2016   Procedure: Peripheral Vascular Atherectomy;  Surgeon: Waynetta Sandy, MD;  Location: [facility];  Service: Cardiovascular;  Laterality: Right;  Popliteal   PERMANENT PACEMAKER INSERTION N/A 10/08/2014   Procedure: PERMANENT PACEMAKER INSERTION;  Surgeon: Deboraha Sprang, MD;  Location: [facility] CATH LAB;  Service: Cardiovascular;  Laterality: N/A;   RADIOLOGY WITH ANESTHESIA N/A 01/19/2022   Procedure: Disc Aspiration;  Surgeon: Pedro Earls, MD;  Location: [facility];  Service: Radiology;  Laterality: N/A;   SUBMUCOSAL TATTOO INJECTION  07/20/2020   Procedure: SUBMUCOSAL TATTOO INJECTION;  Surgeon: Irving Copas., MD;  Location: [place] [facility];  Service: Gastroenterology;;   TEE WITHOUT CARDIOVERSION N/A 10/27/2021   Procedure: TRANSESOPHAGEAL ECHOCARDIOGRAM (TEE);  Surgeon: Adrian Prows, MD;  Location: [facility] ENDOSCOPY;  Service: Cardiovascular;  Laterality: N/A;   TEMPORARY PACEMAKER INSERTION Bilateral 10/07/2014   Procedure: TEMPORARY PACEMAKER INSERTION;  Surgeon: Laverda Page, MD;  Location: [facility] CATH LAB;  Service: Cardiovascular;  Laterality: Bilateral;   WOUND DEBRIDEMENT Right 02/19/2020   Procedure: DEBRIDEMENT WOUND RIGHT FOOT;  Surgeon: Angelia Mould, MD;  Location: [facility] OR;  Service: Vascular;  Laterality: Right;   Social History   Occupational History   Occupation: Disabled  Tobacco Use  Smoking status: Some Days    Packs/day: 1.50    Years: 30.00    Total pack years: 45.00    Types: Cigarettes    Last attempt to quit: 10/07/2014    Years since quitting: 7.5    Passive exposure: Current   Smokeless tobacco: Never   Tobacco comments:    Does an occasional cigar  Vaping Use   Vaping  Use: Never used  Substance and Sexual Activity   Alcohol use: Not Currently    Alcohol/week: 21.0 standard drinks of alcohol    Types: 21 Glasses of wine per week    Comment: cut way back, none in the last month   Drug use: No   Sexual activity: Not on file

## 2022-05-10 ENCOUNTER — Encounter: Payer: Self-pay | Admitting: Physical Medicine & Rehabilitation

## 2022-05-10 ENCOUNTER — Encounter: Payer: Medicare Other | Admitting: Orthopedic Surgery

## 2022-05-10 ENCOUNTER — Other Ambulatory Visit: Payer: Self-pay

## 2022-05-10 ENCOUNTER — Encounter (HOSPITAL_BASED_OUTPATIENT_CLINIC_OR_DEPARTMENT_OTHER): Payer: Medicare Other | Admitting: Physical Medicine & Rehabilitation

## 2022-05-10 ENCOUNTER — Encounter (HOSPITAL_COMMUNITY): Payer: Self-pay | Admitting: Orthopedic Surgery

## 2022-05-10 VITALS — BP 120/67 | HR 81 | Ht 72.0 in

## 2022-05-10 DIAGNOSIS — Z833 Family history of diabetes mellitus: Secondary | ICD-10-CM | POA: Diagnosis not present

## 2022-05-10 DIAGNOSIS — L97228 Non-pressure chronic ulcer of left calf with other specified severity: Secondary | ICD-10-CM | POA: Diagnosis not present

## 2022-05-10 DIAGNOSIS — Z79891 Long term (current) use of opiate analgesic: Secondary | ICD-10-CM | POA: Diagnosis not present

## 2022-05-10 DIAGNOSIS — M898X9 Other specified disorders of bone, unspecified site: Secondary | ICD-10-CM | POA: Diagnosis present

## 2022-05-10 DIAGNOSIS — M869 Osteomyelitis, unspecified: Secondary | ICD-10-CM | POA: Diagnosis not present

## 2022-05-10 DIAGNOSIS — Z950 Presence of cardiac pacemaker: Secondary | ICD-10-CM | POA: Diagnosis not present

## 2022-05-10 DIAGNOSIS — G894 Chronic pain syndrome: Secondary | ICD-10-CM | POA: Diagnosis not present

## 2022-05-10 DIAGNOSIS — L97929 Non-pressure chronic ulcer of unspecified part of left lower leg with unspecified severity: Secondary | ICD-10-CM | POA: Diagnosis not present

## 2022-05-10 DIAGNOSIS — Z5181 Encounter for therapeutic drug level monitoring: Secondary | ICD-10-CM

## 2022-05-10 DIAGNOSIS — I120 Hypertensive chronic kidney disease with stage 5 chronic kidney disease or end stage renal disease: Secondary | ICD-10-CM | POA: Diagnosis not present

## 2022-05-10 DIAGNOSIS — Z955 Presence of coronary angioplasty implant and graft: Secondary | ICD-10-CM | POA: Diagnosis not present

## 2022-05-10 DIAGNOSIS — L03116 Cellulitis of left lower limb: Secondary | ICD-10-CM | POA: Diagnosis not present

## 2022-05-10 DIAGNOSIS — N186 End stage renal disease: Secondary | ICD-10-CM | POA: Diagnosis not present

## 2022-05-10 DIAGNOSIS — Z89432 Acquired absence of left foot: Secondary | ICD-10-CM

## 2022-05-10 DIAGNOSIS — K219 Gastro-esophageal reflux disease without esophagitis: Secondary | ICD-10-CM | POA: Diagnosis present

## 2022-05-10 DIAGNOSIS — I70262 Atherosclerosis of native arteries of extremities with gangrene, left leg: Secondary | ICD-10-CM | POA: Diagnosis not present

## 2022-05-10 DIAGNOSIS — I4891 Unspecified atrial fibrillation: Secondary | ICD-10-CM | POA: Diagnosis not present

## 2022-05-10 DIAGNOSIS — Z89611 Acquired absence of right leg above knee: Secondary | ICD-10-CM | POA: Diagnosis not present

## 2022-05-10 DIAGNOSIS — I96 Gangrene, not elsewhere classified: Secondary | ICD-10-CM | POA: Diagnosis not present

## 2022-05-10 DIAGNOSIS — R0902 Hypoxemia: Secondary | ICD-10-CM | POA: Diagnosis not present

## 2022-05-10 DIAGNOSIS — M86162 Other acute osteomyelitis, left tibia and fibula: Secondary | ICD-10-CM | POA: Diagnosis not present

## 2022-05-10 DIAGNOSIS — E785 Hyperlipidemia, unspecified: Secondary | ICD-10-CM | POA: Diagnosis present

## 2022-05-10 DIAGNOSIS — I2510 Atherosclerotic heart disease of native coronary artery without angina pectoris: Secondary | ICD-10-CM | POA: Diagnosis present

## 2022-05-10 DIAGNOSIS — L02612 Cutaneous abscess of left foot: Secondary | ICD-10-CM | POA: Diagnosis not present

## 2022-05-10 DIAGNOSIS — F17290 Nicotine dependence, other tobacco product, uncomplicated: Secondary | ICD-10-CM | POA: Diagnosis not present

## 2022-05-10 DIAGNOSIS — N250 Renal osteodystrophy: Secondary | ICD-10-CM | POA: Diagnosis not present

## 2022-05-10 DIAGNOSIS — Z992 Dependence on renal dialysis: Secondary | ICD-10-CM | POA: Diagnosis not present

## 2022-05-10 DIAGNOSIS — Z8249 Family history of ischemic heart disease and other diseases of the circulatory system: Secondary | ICD-10-CM | POA: Diagnosis not present

## 2022-05-10 DIAGNOSIS — N2581 Secondary hyperparathyroidism of renal origin: Secondary | ICD-10-CM | POA: Diagnosis not present

## 2022-05-10 DIAGNOSIS — E1169 Type 2 diabetes mellitus with other specified complication: Secondary | ICD-10-CM | POA: Diagnosis not present

## 2022-05-10 DIAGNOSIS — E11622 Type 2 diabetes mellitus with other skin ulcer: Secondary | ICD-10-CM | POA: Diagnosis not present

## 2022-05-10 DIAGNOSIS — E1122 Type 2 diabetes mellitus with diabetic chronic kidney disease: Secondary | ICD-10-CM | POA: Diagnosis not present

## 2022-05-10 DIAGNOSIS — E1152 Type 2 diabetes mellitus with diabetic peripheral angiopathy with gangrene: Secondary | ICD-10-CM | POA: Diagnosis not present

## 2022-05-10 DIAGNOSIS — M1990 Unspecified osteoarthritis, unspecified site: Secondary | ICD-10-CM | POA: Diagnosis not present

## 2022-05-10 DIAGNOSIS — D631 Anemia in chronic kidney disease: Secondary | ICD-10-CM | POA: Diagnosis not present

## 2022-05-10 DIAGNOSIS — E11628 Type 2 diabetes mellitus with other skin complications: Secondary | ICD-10-CM | POA: Diagnosis not present

## 2022-05-10 DIAGNOSIS — M86172 Other acute osteomyelitis, left ankle and foot: Secondary | ICD-10-CM | POA: Diagnosis not present

## 2022-05-10 NOTE — Progress Notes (Signed)
Subjective:    Patient ID: Alexander [name], male    DOB: [birth year], 61 y.o.   MRN: [id number]  HPI 61 yo male with complex medical hx of ESRD, diabetes, CAD, PPM with chronic infection, Lumbar stenosis who is referred today for physical medicine and rehabilitation evaluation.  He complains of chronic pain in the low back as well as right foot phantom pain and left lower extremity pain.  He has been taking oxycodone 5 mg approximately twice a day per report PDMP 90-day average MME is 7.5.  In addition the patient takes gabapentin 300 mg 3 times daily, discussed upper and renal dose is usually 300 mg/day total but the patient has denied any type of side effects on this medication.  Has a hospital bed Unable to dress Lower body , daughter and wife assist, wife has chronic pain issues of her own.  Uses WC for mobility , uses bed pan for BM and urinal   Uses ETOH occasionally 1 beer every 2-3 d    In [facility]  Right AKA March 2023, Phantom pain and sensation   Scheduled for Left BKA tomorrow   Pain Inventory Average Pain 5 Pain Right Now 0 My pain is intermittent, sharp, burning, dull, stabbing, tingling, and aching  In the last 24 hours, has pain interfered with the following? General activity 10 Relation with others 0 Enjoyment of life 6 What TIME of day is your pain at its worst? varies Sleep (in general) Fair  Pain is worse with: unsure Pain improves with: medication Relief from Meds: 2  use a wheelchair needs help with transfers  disabled: date disabled . I need assistance with the following:  dressing  trouble walking spasms  Any changes since last visit?  no  Any changes since last visit?  no    Family History  Problem Relation Age of Onset   Diabetes type II Mother    Hypertension Mother    Diabetes Mother    Liver cancer Father    Diabetes type II Sister    Breast cancer Sister    Diabetes Sister    Hypertension Sister    Diabetes type II Brother    Kidney  failure Brother    Diabetes Brother    Hypertension Brother    Diabetes type II Sister    Social History   Socioeconomic History   Marital status: Married    Spouse name: Not on file   Number of children: 2   Years of education: Not on file   Highest education level: Not on file  Occupational History   Occupation: Disabled  Tobacco Use   Smoking status: Some Days    Packs/day: 1.50    Years: 30.00    Total pack years: 45.00    Types: Cigarettes    Last attempt to quit: 10/07/2014    Years since quitting: 7.5    Passive exposure: Current   Smokeless tobacco: Never   Tobacco comments:    Does an occasional cigar  Vaping Use   Vaping Use: Never used  Substance and Sexual Activity   Alcohol use: Not Currently    Alcohol/week: 21.0 standard drinks of alcohol    Types: 21 Glasses of wine per week    Comment: cut way back, none in the last month   Drug use: No   Sexual activity: Not on file  Other Topics Concern   Not on file  Social History Narrative   Not on file   Social  Determinants of Health   Financial Resource Strain: Not on file  Food Insecurity: Not on file  Transportation Needs: Not on file  Physical Activity: Not on file  Stress: Not on file  Social Connections: Not on file   Past Surgical History:  Procedure Laterality Date   A/V FISTULAGRAM Right 05/01/2021   Procedure: A/V FISTULAGRAM;  Surgeon: Waynetta Sandy, MD;  Location: [place] [facility];  Service: Cardiovascular;  Laterality: Right;  failed fistulagram   ABDOMINAL AORTOGRAM W/LOWER EXTREMITY N/A 09/09/2017   Procedure: ABDOMINAL AORTOGRAM W/LOWER EXTREMITY;  Surgeon: Angelia Mould, MD;  Location: [place] [facility];  Service: Cardiovascular;  Laterality: N/A;   ABDOMINAL AORTOGRAM W/LOWER EXTREMITY Right 11/11/2019   Procedure: ABDOMINAL AORTOGRAM W/LOWER EXTREMITY;  Surgeon: Marty Heck, MD;  Location: [place] [facility];  Service: Cardiovascular;  Laterality:  Right;   ABDOMINAL AORTOGRAM W/LOWER EXTREMITY N/A 01/26/2022   Procedure: ABDOMINAL AORTOGRAM W/LOWER EXTREMITY;  Surgeon: Adrian Prows, MD;  Location: [place] [facility];  Service: Cardiovascular;  Laterality: N/A;   AMPUTATION Left 08/19/2013   Procedure: AMPUTATION RAY;  Surgeon: Alta Corning, MD;  Location: [place];  Service: Orthopedics;  Laterality: Left;  ray amputation left 5th   AMPUTATION Left 10/27/2013   Procedure: AMPUTATION DIGIT-LEFT 4TH TOE, 4th and 5th metatarsal.;  Surgeon: Angelia Mould, MD;  Location: [place];  Service: Vascular;  Laterality: Left;   AMPUTATION Left 11/04/2013   Procedure: LEFT FOOT TRANS-METATARSAL AMPUTATION WITH WOUND CLOSURE ;  Surgeon: Alta Corning, MD;  Location: [place];  Service: Orthopedics;  Laterality: Left;   AMPUTATION Right 09/10/2017   Procedure: RIGHT FOURTH AND FIFTH TOE AMPUTATION;  Surgeon: Angelia Mould, MD;  Location: [place];  Service: Vascular;  Laterality: Right;   AMPUTATION Right 02/19/2020   Procedure: AMPUTATION RIGHT 3RD TOE;  Surgeon: Angelia Mould, MD;  Location: [place];  Service: Vascular;  Laterality: Right;   AMPUTATION Right 03/11/2020   Procedure: RIGHT BELOW KNEE AMPUTATION;  Surgeon: Newt Minion, MD;  Location: [place];  Service: Orthopedics;  Laterality: Right;   AMPUTATION Right 01/31/2022   Procedure: RIGHT ABOVE KNEE AMPUTATION;  Surgeon: Newt Minion, MD;  Location: [place];  Service: Orthopedics;  Laterality: Right;   APPLICATION OF WOUND VAC Right 02/19/2020   Procedure: Application Of Wound Vac Right foot;  Surgeon: Angelia Mould, MD;  Location: [place];  Service: Vascular;  Laterality: Right;   AV FISTULA PLACEMENT Right 03/27/2021   Procedure: RIGHT ARM ARTERIOVENOUS (AV) FISTULA;  Surgeon: Angelia Mould, MD;  Location: [place];  Service: Vascular;  Laterality: Right;   AV FISTULA PLACEMENT Right 05/10/2021   Procedure: RIGHT ARM ARTERIOVENOUS (AV) FISTULA;  Surgeon: Waynetta Sandy, MD;  Location: [place];  Service: Vascular;  Laterality: Right;   [place] Right 08/11/2021   Procedure: RIGHT ARM SECOND STAGE BASILIC VEIN TRANSPOSITION;  Surgeon: Waynetta Sandy, MD;  Location: [place];  Service: Vascular;  Laterality: Right;   BIOPSY  07/20/2020   Procedure: BIOPSY;  Surgeon: Irving Copas., MD;  Location: [place];  Service: Gastroenterology;;   CARDIAC CATHETERIZATION  10/07/2014   Procedure: LEFT HEART CATH AND CORONARY ANGIOGRAPHY;  Surgeon: Laverda Page, MD;  Location: [facility] CATH LAB;  Service: Cardiovascular;;   COLONOSCOPY W/ POLYPECTOMY     COLONOSCOPY WITH PROPOFOL N/A 05/25/2020   Procedure: COLONOSCOPY WITH PROPOFOL;  Surgeon: Gatha Mayer,  MD;  Location: [facility];  Service: Endoscopy;  Laterality: N/A;   EMBOLECTOMY Right 09/12/2017   Procedure: Thrombectomy  & Redo Right Below Knee Popliteal Artery Bypass Graft  ;  Surgeon: Waynetta Sandy, MD;  Location[facility];  Service: Vascular;  Laterality: Right;   ENTEROSCOPY N/A 07/20/2020   Procedure: ENTEROSCOPY;  Surgeon: Rush Landmark Telford Nab., MD;  Location: [place];  Service: Gastroenterology;  Laterality: N/A;   ESOPHAGOGASTRODUODENOSCOPY (EGD) WITH PROPOFOL N/A 05/24/2020   Procedure: ESOPHAGOGASTRODUODENOSCOPY (EGD) WITH PROPOFOL;  Surgeon: Ladene Artist, MD;  Location: [facility];  Service: Endoscopy;  Laterality: N/A;   EYE SURGERY Bilateral    cataract   FEMORAL-TIBIAL BYPASS GRAFT Left 10/27/2013   Procedure: BYPASS GRAFT LEFT FEMORAL- POSTERIOR TIBIAL ARTERY;  Surgeon: Angelia Mould, MD;  Location: [place];  Service: Vascular;  Laterality: Left;   FEMORAL-TIBIAL BYPASS GRAFT Right 09/10/2017   Procedure: RIGHT SUPERFICIAL  FEMORAL ARTERY-BELOW KNEE POPLITEAL ARTERY BYPASS GRAFT WITH VEIN;  Surgeon: Angelia Mould, MD;  Location: [place];  Service: Vascular;  Laterality: Right;   GIVENS CAPSULE  STUDY N/A 07/20/2020   Procedure: GIVENS CAPSULE STUDY;  Surgeon: Irving Copas., MD;  Location: [place];  Service: Gastroenterology;  Laterality: N/A;  capsule placed through scope into duodenum @ 0936   I & D EXTREMITY Left 10/27/2013   Procedure: IRRIGATION AND DEBRIDEMENT EXTREMITY- LEFT FOOT;  Surgeon: Angelia Mould, MD;  Location: [place];  Service: Vascular;  Laterality: Left;   I & D EXTREMITY Right 01/26/2020   Procedure: IRRIGATION AND DEBRIDEMENT EXTREMITY RIGHT FOOT WOUND;  Surgeon: Angelia Mould, MD;  Location: [place];  Service: Vascular;  Laterality: Right;   INSERT / REPLACE / REMOVE PACEMAKER     INSERTION OF DIALYSIS CATHETER Right 11/03/2021   Procedure: RIGHT INTERNAL JUGULAR INSERTION OF TUNNELED DIALYSIS CATHETER;  Surgeon: Angelia Mould, MD;  Location: [place];  Service: Vascular;  Laterality: Right;   INTRAOPERATIVE ARTERIOGRAM Right 09/10/2017   Procedure: INTRA OPERATIVE ARTERIOGRAM;  Surgeon: Angelia Mould, MD;  Location: [place];  Service: Vascular;  Laterality: Right;   IR ANGIOGRAM FOLLOW UP STUDY  09/09/2017   IR FLUORO GUIDE CV LINE RIGHT  05/31/2020   IR LUMBAR DISC ASPIRATION W/IMG GUIDE  01/19/2022   IR US GUIDE VASC ACCESS RIGHT  06/01/2020   LEFT HEART CATH AND CORONARY ANGIOGRAPHY N/A 08/19/2020   Procedure: LEFT HEART CATH AND CORONARY ANGIOGRAPHY;  Surgeon: Adrian Prows, MD;  Location: [place] [facility];  Service: Cardiovascular;  Laterality: N/A;   LEFT HEART CATHETERIZATION WITH CORONARY ANGIOGRAM N/A 11/09/2014   Procedure: LEFT HEART CATHETERIZATION WITH CORONARY ANGIOGRAM;  Surgeon: Laverda Page, MD;  Location: [facility] CATH LAB;  Service: Cardiovascular;  Laterality: N/A;   LOWER EXTREMITY ANGIOGRAM Right 11/08/2015   Procedure: Lower Extremity Angiogram;  Surgeon: Serafina [place], MD;  Location: [place] [facility];  Service: Cardiovascular;  Laterality: Right;   LUMBAR LAMINECTOMY  06/13/2012   Procedure:  MICRODISCECTOMY LUMBAR LAMINECTOMY;  Surgeon: Jessy Oto, MD;  Location: [place];  Service: Orthopedics;  Laterality: N/A;  Central laminectomy L2-3, L3-4, L4-5   PACEMAKER INSERTION  10/08/2014   MDT Advisa MRI compatible dual chamber pacemaker implanted by Dr Caryl Comes for syncope with post-termination pauses   PERCUTANEOUS CORONARY STENT INTERVENTION (PCI-S)  11/09/2014   des to lad & distal circumflex         Dr  [name]   PERIPHERAL VASCULAR BALLOON ANGIOPLASTY Right 11/11/2019  Procedure: PERIPHERAL VASCULAR BALLOON ANGIOPLASTY;  Surgeon: Marty Heck, MD;  Location: [place] [facility];  Service: Cardiovascular;  Laterality: Right;  below knee popliteal, tibioperoneal trunk, posterior tibial arteries   PERIPHERAL VASCULAR CATHETERIZATION N/A 11/08/2015   Procedure: Abdominal Aortogram;  Surgeon: Serafina [place], MD;  Location: [place] [facility];  Service: Cardiovascular;  Laterality: N/A;   PERIPHERAL VASCULAR CATHETERIZATION Right 11/08/2015   Procedure: Peripheral Vascular Atherectomy;  Surgeon: Serafina [place], MD;  Location: [place] [facility];  Service: Cardiovascular;  Laterality: Right;   PERIPHERAL VASCULAR CATHETERIZATION N/A 10/10/2016   Procedure: Lower Extremity Angiography;  Surgeon: Waynetta Sandy, MD;  Location: [place] [facility];  Service: Cardiovascular;  Laterality: N/A;   PERIPHERAL VASCULAR CATHETERIZATION Right 10/10/2016   Procedure: Peripheral Vascular Atherectomy;  Surgeon: Waynetta Sandy, MD;  Location: [place] [facility];  Service: Cardiovascular;  Laterality: Right;  Popliteal   PERMANENT PACEMAKER INSERTION N/A 10/08/2014   Procedure: PERMANENT PACEMAKER INSERTION;  Surgeon: Deboraha Sprang, MD;  Location: [facility] CATH LAB;  Service: Cardiovascular;  Laterality: N/A;   RADIOLOGY WITH ANESTHESIA N/A 01/19/2022   Procedure: Disc Aspiration;  Surgeon: Pedro Earls, MD;  Location: [place];  Service: Radiology;  Laterality: N/A;    SUBMUCOSAL TATTOO INJECTION  07/20/2020   Procedure: SUBMUCOSAL TATTOO INJECTION;  Surgeon: Irving Copas., MD;  Location: [place];  Service: Gastroenterology;;   TEE WITHOUT CARDIOVERSION N/A 10/27/2021   Procedure: TRANSESOPHAGEAL ECHOCARDIOGRAM (TEE);  Surgeon: Adrian Prows, MD;  Location: [facility] ENDOSCOPY;  Service: Cardiovascular;  Laterality: N/A;   TEMPORARY PACEMAKER INSERTION Bilateral 10/07/2014   Procedure: TEMPORARY PACEMAKER INSERTION;  Surgeon: Laverda Page, MD;  Location: [facility] C[facility] LAB;  Service: Cardiovascular;  Laterality: Bilateral;   WOUND DEBRIDEMENT Right 02/19/2020   Procedure: DEBRIDEMENT WOUND RIGHT FOOT;  Surgeon: Angelia Mould, MD;  Location: [place];  Service: Vascular;  Laterality: Right;   Past Medical History:  Diagnosis Date   Acute blood loss anemia    Alcohol abuse    Anemia    Anemia of chronic disease    Arthritis    "patient does not think so."   Atrial fibrillation ([place])    Cardiac syncope 10/07/2014   rapid A fib with 8 sec pauses on converison with syncope- temp pacing wire placed then PPM   Cataract    BILATERAL   Coronary artery disease    Diabetes mellitus    dx---been  awhile   Elevated LFTs    Endocarditis of tricuspid valve    ESRD (end stage renal disease) ([place])    [place]   GERD (gastroesophageal reflux disease)    History of blood transfusion    Hyperlipemia    Hypertension    Malnutrition ([place])    Osteomyelitis ([place])    right foot   Peripheral vascular disease ([place])    Presence of permanent cardiac pacemaker 10/08/2014   [employer]   Tricuspid valve vegetation    BP 120/67   Pulse 81   Ht 6' (1.829 m)   SpO2 99%   BMI 24.22 kg/m   Opioid Risk Score:   Fall Risk Score:  `1  Depression screen [facility] 2/9     05/10/2022    9:54 AM 01/18/2014    2:54 PM 11/20/2013    2:02 PM  Depression screen PHQ 2/9  Decreased Interest 0 0 0  Down, Depressed, Hopeless 0 0 0  PHQ - 2 Score 0 0  0   Altered sleeping 0    Tired, decreased energy 2    Change in appetite 3    Feeling bad or failure about yourself  0    Trouble concentrating 0    Moving slowly or fidgety/restless 0    Suicidal thoughts 0    PHQ-9 Score 5       Review of Systems  Constitutional:  Positive for appetite change and unexpected weight change.  HENT: Negative.    Eyes: Negative.   Respiratory: Negative.    Cardiovascular:  Positive for leg swelling.  Gastrointestinal:  Positive for nausea and vomiting.  Endocrine: Negative.   Genitourinary: Negative.   Musculoskeletal:  Positive for back pain and gait problem.  Skin:  Positive for rash.  Allergic/Immunologic: Negative.   Hematological:  Bruises/bleeds easily.  Psychiatric/Behavioral: Negative.        Objective:   Physical Exam Vitals and nursing note reviewed.  Constitutional:      Appearance: He is normal weight.  HENT:     Head: Normocephalic and atraumatic.  Eyes:     Extraocular Movements: Extraocular movements intact.     Conjunctiva/sclera: Conjunctivae normal.     Pupils: Pupils are equal, round, and reactive to light.  Musculoskeletal:        General: Deformity present.     Cervical back: Normal range of motion.     Right lower leg: No edema.     Comments: Right AKA Left TMA No tenderness palpation along the thoracic or lumbar paraspinal area. Negative straight leg raise left lower extremity  Neurological:     Mental Status: He is alert.     Comments: Intact sensation in fingers bilaterally there is mild hand intrinsic atrophy bilaterally Motor strength is 4/5 bilateral grip 4+ biceps triceps and deltoid bilaterally Right lower extremity 3 - hip flexion hip adduction Left lower extremity to minus hip flexion 3 - knee extension           Assessment & Plan:   1.  History of peripheral vascular disease with underlying diabetes and end-stage renal disease.  He is status post right AKA as well as left TMA.  He is scheduled  for left BKA tomorrow.  Given his dependent functional status he would benefit from additional rehabilitation postoperatively.  Discussed with rehab nurse admission coordinator, his insurance plan would not support CIR therefore SNF would likely be best option. We will get oral swab We will see patient back in clinic in 4 to 6 weeks which should allow for adequate healing. Do not think patient has good prognosis for ambulating with prosthetics but may use them for transfers.  He should be able to learn sliding board transfers even with both amputations. Discussed with patient as well as his daughter who attends the appointment today.

## 2022-05-10 NOTE — Congregational Nurse Program (Signed)
Spoke with pt's daughter, [name] for pre-op call. Pt was able to hear and answer questions also. Pt has hx of sick sinus syndrome and has a pacemaker. Dr. [name] is his cardiologist. Perioperative Prescription for ICD programming sent to Dr. Irven Shelling office. Pt denies any recent chest pain or shortness of breath. Pt is on Dialysis M/W/F. Pt is a type 2 diabetic. Last A1C was 6.8 on 10/29/21. [name] states pt usually checks his blood sugar in the evenings. Instructed [name] to have patient take 1/2 of his regular sliding scale dose for his 70/30 insulin tonight. Pt is not to take his Insulin 70/30 in the AM. Instructed her to have pt check his blood sugar in the AM when he wakes up and every 2 hours until he leaves for the hospital. If blood sugar is 70 or below, treat with 1/2 cup of clear juice (apple or cranberry) and recheck blood sugar 15 minutes after drinking juice. If blood sugar continues to be 70 or below, call the Short Stay department and ask to speak to a nurse. [name] voiced understanding.  Shower Manus Rudd instructions given to [employer].

## 2022-05-10 NOTE — Patient Instructions (Signed)
I had my nurse check on insurance looks like you may need to  go to nursing home for a couple weeks post op

## 2022-05-10 NOTE — Anesthesia Preprocedure Evaluation (Addendum)
Anesthesia Evaluation  Patient identified by MRN, date of birth, ID band Patient awake    Reviewed: Allergy & Precautions, NPO status , Patient's Chart, lab work & pertinent test results  History of Anesthesia Complications Negative for: history of anesthetic complications  Airway Mallampati: II  TM Distance: >3 FB Neck ROM: Full    Dental  (+) Poor Dentition, Chipped, Missing, Edentulous Upper, Dental Advisory Given   Pulmonary Current Smoker, former smoker,    Pulmonary exam normal breath sounds clear to auscultation       Cardiovascular hypertension, Pt. on home beta blockers and Pt. on medications pulmonary hypertension+ CAD, + Peripheral Vascular Disease and +CHF  + dysrhythmias Atrial Fibrillation + pacemaker + Valvular Problems/Murmurs  Rhythm:Irregular Rate:Normal  Echo 01/20/22:  1. Left ventricular ejection fraction, by estimation, is 30 to 35%. The left ventricle has moderately decreased function. The left ventricle demonstrates global hypokinesis. There is mild left ventricular hypertrophy. Left ventricular diastolic function could not be evaluated.  2. Right ventricular systolic function is hyperdynamic. The right ventricular size is normal. There is moderately elevated pulmonary artery systolic pressure. The estimated right ventricular systolic pressure is 23.5 mmHg.  3. Mild mitral valve regurgitation.  4. There is a vegetation noted on the TV leaflet and septal leaflet is flail. Cannot exclude presence of vegetation on the pacemaker lead. The tricuspid valve is degenerative. Tricuspid valve regurgitation is severe.  5. The aortic valve is normal in structure. Aortic valve regurgitation is not visualized.  6. The inferior vena cava is dilated in size with <50% respiratory variability, suggesting right atrial pressure of 15 mmHg.    Neuro/Psych negative neurological ROS  negative psych ROS   GI/Hepatic GERD  ,(+)      substance abuse  alcohol use,   Endo/Other  diabetes, Type 2, Insulin Dependent Obesity Na 129 Cl 94 Ca 7.5   Renal/GU ESRF and DialysisRenal disease     Musculoskeletal  (+) Arthritis ,   Abdominal   Peds  Hematology  (+) Blood dyscrasia, anemia ,  Plt 89k    Anesthesia Other Findings   K 4.0, Hgb 11.6, plts 139, INR 1.4  Reproductive/Obstetrics                           Anesthesia Physical  Anesthesia Plan  ASA: 4  Anesthesia Plan: General   Post-op Pain Management:    Induction: Intravenous  PONV Risk Score and Plan: 3 and Treatment may vary due to age or medical condition, Ondansetron, Midazolam and Dexamethasone  Airway Management Planned: Oral ETT  Additional Equipment: None  Intra-op Plan:   Post-operative Plan: Extubation in OR  Informed Consent: I have reviewed the patients History and Physical, chart, labs and discussed the procedure including the risks, benefits and alternatives for the proposed anesthesia with the patient or authorized representative who has indicated his/her understanding and acceptance.     Dental advisory given  Plan Discussed with: CRNA  Anesthesia Plan Comments: (Patient refused regional anesthesia)     Anesthesia Quick Evaluation

## 2022-05-11 ENCOUNTER — Inpatient Hospital Stay (HOSPITAL_COMMUNITY): Payer: Medicare Other

## 2022-05-11 ENCOUNTER — Inpatient Hospital Stay (HOSPITAL_COMMUNITY): Payer: Medicare Other | Admitting: Anesthesiology

## 2022-05-11 ENCOUNTER — Encounter: Payer: Self-pay | Admitting: Cardiology

## 2022-05-11 ENCOUNTER — Inpatient Hospital Stay (HOSPITAL_COMMUNITY)
Admission: RE | Admit: 2022-05-11 | Discharge: 2022-05-15 | DRG: 617 | Disposition: A | Discharge status: Home / Self Care | Payer: Medicare Other | Attending: Orthopedic Surgery | Admitting: Orthopedic Surgery

## 2022-05-11 ENCOUNTER — Encounter (HOSPITAL_COMMUNITY): Payer: Self-pay | Admitting: Orthopedic Surgery

## 2022-05-11 ENCOUNTER — Other Ambulatory Visit: Payer: Self-pay

## 2022-05-11 ENCOUNTER — Encounter (HOSPITAL_COMMUNITY)
Admission: RE | Disposition: A | Discharge status: Home / Self Care | Payer: Self-pay | Source: Home / Self Care | Attending: Orthopedic Surgery

## 2022-05-11 DIAGNOSIS — N186 End stage renal disease: Secondary | ICD-10-CM | POA: Diagnosis not present

## 2022-05-11 DIAGNOSIS — I2510 Atherosclerotic heart disease of native coronary artery without angina pectoris: Secondary | ICD-10-CM | POA: Diagnosis present

## 2022-05-11 DIAGNOSIS — D631 Anemia in chronic kidney disease: Secondary | ICD-10-CM | POA: Diagnosis present

## 2022-05-11 DIAGNOSIS — M898X9 Other specified disorders of bone, unspecified site: Secondary | ICD-10-CM | POA: Diagnosis present

## 2022-05-11 DIAGNOSIS — Z79899 Other long term (current) drug therapy: Secondary | ICD-10-CM

## 2022-05-11 DIAGNOSIS — Z8249 Family history of ischemic heart disease and other diseases of the circulatory system: Secondary | ICD-10-CM | POA: Diagnosis not present

## 2022-05-11 DIAGNOSIS — I4891 Unspecified atrial fibrillation: Secondary | ICD-10-CM | POA: Diagnosis present

## 2022-05-11 DIAGNOSIS — I120 Hypertensive chronic kidney disease with stage 5 chronic kidney disease or end stage renal disease: Secondary | ICD-10-CM | POA: Diagnosis present

## 2022-05-11 DIAGNOSIS — M1990 Unspecified osteoarthritis, unspecified site: Secondary | ICD-10-CM | POA: Diagnosis present

## 2022-05-11 DIAGNOSIS — F17290 Nicotine dependence, other tobacco product, uncomplicated: Secondary | ICD-10-CM | POA: Diagnosis present

## 2022-05-11 DIAGNOSIS — M869 Osteomyelitis, unspecified: Secondary | ICD-10-CM

## 2022-05-11 DIAGNOSIS — S88112A Complete traumatic amputation at level between knee and ankle, left lower leg, initial encounter: Secondary | ICD-10-CM | POA: Diagnosis present

## 2022-05-11 DIAGNOSIS — Z888 Allergy status to other drugs, medicaments and biological substances status: Secondary | ICD-10-CM

## 2022-05-11 DIAGNOSIS — Z89611 Acquired absence of right leg above knee: Secondary | ICD-10-CM

## 2022-05-11 DIAGNOSIS — Z950 Presence of cardiac pacemaker: Secondary | ICD-10-CM

## 2022-05-11 DIAGNOSIS — Z882 Allergy status to sulfonamides status: Secondary | ICD-10-CM

## 2022-05-11 DIAGNOSIS — Z89422 Acquired absence of other left toe(s): Secondary | ICD-10-CM

## 2022-05-11 DIAGNOSIS — Z794 Long term (current) use of insulin: Secondary | ICD-10-CM

## 2022-05-11 DIAGNOSIS — E1122 Type 2 diabetes mellitus with diabetic chronic kidney disease: Secondary | ICD-10-CM | POA: Diagnosis not present

## 2022-05-11 DIAGNOSIS — Z833 Family history of diabetes mellitus: Secondary | ICD-10-CM

## 2022-05-11 DIAGNOSIS — Z992 Dependence on renal dialysis: Secondary | ICD-10-CM

## 2022-05-11 DIAGNOSIS — E785 Hyperlipidemia, unspecified: Secondary | ICD-10-CM | POA: Diagnosis present

## 2022-05-11 DIAGNOSIS — N2581 Secondary hyperparathyroidism of renal origin: Secondary | ICD-10-CM | POA: Diagnosis present

## 2022-05-11 DIAGNOSIS — E1152 Type 2 diabetes mellitus with diabetic peripheral angiopathy with gangrene: Secondary | ICD-10-CM | POA: Diagnosis present

## 2022-05-11 DIAGNOSIS — E11622 Type 2 diabetes mellitus with other skin ulcer: Secondary | ICD-10-CM | POA: Diagnosis present

## 2022-05-11 DIAGNOSIS — I96 Gangrene, not elsewhere classified: Secondary | ICD-10-CM | POA: Diagnosis present

## 2022-05-11 DIAGNOSIS — L97228 Non-pressure chronic ulcer of left calf with other specified severity: Secondary | ICD-10-CM | POA: Diagnosis present

## 2022-05-11 DIAGNOSIS — K219 Gastro-esophageal reflux disease without esophagitis: Secondary | ICD-10-CM | POA: Diagnosis present

## 2022-05-11 DIAGNOSIS — L02612 Cutaneous abscess of left foot: Secondary | ICD-10-CM | POA: Diagnosis present

## 2022-05-11 DIAGNOSIS — E1169 Type 2 diabetes mellitus with other specified complication: Secondary | ICD-10-CM | POA: Diagnosis present

## 2022-05-11 DIAGNOSIS — Z7901 Long term (current) use of anticoagulants: Secondary | ICD-10-CM

## 2022-05-11 DIAGNOSIS — M86172 Other acute osteomyelitis, left ankle and foot: Secondary | ICD-10-CM | POA: Diagnosis present

## 2022-05-11 DIAGNOSIS — Z955 Presence of coronary angioplasty implant and graft: Secondary | ICD-10-CM | POA: Diagnosis not present

## 2022-05-11 DIAGNOSIS — Z792 Long term (current) use of antibiotics: Secondary | ICD-10-CM

## 2022-05-11 DIAGNOSIS — Z881 Allergy status to other antibiotic agents status: Secondary | ICD-10-CM

## 2022-05-11 HISTORY — DX: Pneumonia, unspecified organism: J18.9

## 2022-05-11 HISTORY — PX: AMPUTATION: SHX166

## 2022-05-11 HISTORY — PX: APPLICATION OF WOUND VAC: SHX5189

## 2022-05-11 LAB — GLUCOSE, CAPILLARY
Glucose-Capillary: 131 mg/dL — ABNORMAL HIGH (ref 70–99)
Glucose-Capillary: 138 mg/dL — ABNORMAL HIGH (ref 70–99)
Glucose-Capillary: 194 mg/dL — ABNORMAL HIGH (ref 70–99)
Glucose-Capillary: 181 mg/dL — ABNORMAL HIGH (ref 70–99)

## 2022-05-11 LAB — HEMOGLOBIN A1C
Hgb A1c MFr Bld: 6.6 % — ABNORMAL HIGH (ref 4.8–5.6)
Mean Plasma Glucose: 142.72 mg/dL

## 2022-05-11 LAB — POCT I-STAT, CHEM 8
BUN: 17 mg/dL (ref 8–23)
Calcium, Ion: 0.88 mmol/L — CL (ref 1.15–1.40)
Chloride: 92 mmol/L — ABNORMAL LOW (ref 98–111)
Creatinine, Ser: 3 mg/dL — ABNORMAL HIGH (ref 0.61–1.24)
Glucose, Bld: 135 mg/dL — ABNORMAL HIGH (ref 70–99)
HCT: 35 % — ABNORMAL LOW (ref 39.0–52.0)
Hemoglobin: 11.9 g/dL — ABNORMAL LOW (ref 13.0–17.0)
Potassium: 3.3 mmol/L — ABNORMAL LOW (ref 3.5–5.1)
Sodium: 132 mmol/L — ABNORMAL LOW (ref 135–145)
TCO2: 30 mmol/L (ref 22–32)

## 2022-05-11 LAB — HEPATITIS B SURFACE ANTIGEN: Hepatitis B Surface Ag: NONREACTIVE

## 2022-05-11 LAB — HEPATITIS B SURFACE ANTIBODY,QUALITATIVE: Hep B S Ab: REACTIVE — AB

## 2022-05-11 LAB — HEPATITIS B CORE ANTIBODY, TOTAL: Hep B Core Total Ab: NONREACTIVE

## 2022-05-11 LAB — HEPATITIS C ANTIBODY: HCV Ab: NONREACTIVE

## 2022-05-11 SURGERY — AMPUTATION BELOW KNEE
Anesthesia: General | Site: Leg Lower | Laterality: Left

## 2022-05-11 MED ORDER — CEFAZOLIN SODIUM-DEXTROSE 2-4 GM/100ML-% IV SOLN
2.0000 g | Freq: Three times a day (TID) | INTRAVENOUS | Status: AC
  Administered 2022-05-11 – 2022-05-12 (×2): 2 g via INTRAVENOUS
  Filled 2022-05-11 (×2): qty 100

## 2022-05-11 MED ORDER — FENTANYL CITRATE (PF) 100 MCG/2ML IJ SOLN
INTRAMUSCULAR | Status: AC
  Filled 2022-05-11: qty 2

## 2022-05-11 MED ORDER — PHENOL 1.4 % MT LIQD: 1.0000 | OROMUCOSAL | Status: DC | PRN

## 2022-05-11 MED ORDER — FENTANYL CITRATE (PF) 250 MCG/5ML IJ SOLN
INTRAMUSCULAR | Status: AC
  Filled 2022-05-11: qty 5

## 2022-05-11 MED ORDER — ALBUMIN HUMAN 5 % IV SOLN: INTRAVENOUS | Status: DC | PRN

## 2022-05-11 MED ORDER — PHENYLEPHRINE 80 MCG/ML (10ML) SYRINGE FOR IV PUSH (FOR BLOOD PRESSURE SUPPORT)
PREFILLED_SYRINGE | INTRAVENOUS | Status: DC | PRN
  Administered 2022-05-11: 160 ug via INTRAVENOUS
  Administered 2022-05-11 (×2): 80 ug via INTRAVENOUS

## 2022-05-11 MED ORDER — HYDROMORPHONE HCL 1 MG/ML IJ SOLN: 0.5000 mg | INTRAMUSCULAR | Status: DC | PRN

## 2022-05-11 MED ORDER — VASOPRESSIN 20 UNIT/ML IV SOLN
INTRAVENOUS | Status: DC | PRN
  Administered 2022-05-11: 1 [IU] via INTRAVENOUS

## 2022-05-11 MED ORDER — CEFAZOLIN SODIUM-DEXTROSE 2-4 GM/100ML-% IV SOLN
2.0000 g | INTRAVENOUS | Status: AC
  Administered 2022-05-11: 2 g via INTRAVENOUS
  Filled 2022-05-11: qty 100

## 2022-05-11 MED ORDER — ACETAMINOPHEN 325 MG PO TABS
325.0000 mg | ORAL_TABLET | Freq: Four times a day (QID) | ORAL | Status: DC | PRN
  Administered 2022-05-12 – 2022-05-15 (×4): 650 mg via ORAL
  Filled 2022-05-11 (×5): qty 2

## 2022-05-11 MED ORDER — CHLORHEXIDINE GLUCONATE 0.12 % MT SOLN
15.0000 mL | Freq: Once | OROMUCOSAL | Status: AC
  Administered 2022-05-11: 15 mL via OROMUCOSAL
  Filled 2022-05-11: qty 15

## 2022-05-11 MED ORDER — LIDOCAINE 2% (20 MG/ML) 5 ML SYRINGE
INTRAMUSCULAR | Status: DC | PRN
  Administered 2022-05-11: 80 mg via INTRAVENOUS

## 2022-05-11 MED ORDER — 0.9 % SODIUM CHLORIDE (POUR BTL) OPTIME
TOPICAL | Status: DC | PRN
  Administered 2022-05-11: 1000 mL

## 2022-05-11 MED ORDER — METOPROLOL TARTRATE 12.5 MG HALF TABLET
25.0000 mg | ORAL_TABLET | Freq: Once | ORAL | Status: AC
Start: 2022-05-11 — End: 2022-05-11

## 2022-05-11 MED ORDER — SODIUM CHLORIDE 0.9 % IV SOLN: INTRAVENOUS | Status: DC | PRN

## 2022-05-11 MED ORDER — POLYETHYLENE GLYCOL 3350 17 G PO PACK: 17.0000 g | PACK | Freq: Every day | ORAL | Status: DC | PRN

## 2022-05-11 MED ORDER — INSULIN ASPART 100 UNIT/ML IJ SOLN: 0.0000 [IU] | INTRAMUSCULAR | Status: DC | PRN

## 2022-05-11 MED ORDER — PROPOFOL 10 MG/ML IV BOLUS
INTRAVENOUS | Status: DC | PRN
  Administered 2022-05-11: 80 mg via INTRAVENOUS

## 2022-05-11 MED ORDER — PANTOPRAZOLE SODIUM 40 MG PO TBEC
40.0000 mg | DELAYED_RELEASE_TABLET | Freq: Every day | ORAL | Status: DC
  Administered 2022-05-11 – 2022-05-15 (×5): 40 mg via ORAL
  Filled 2022-05-11 (×5): qty 1

## 2022-05-11 MED ORDER — PANTOPRAZOLE SODIUM 40 MG PO TBEC: 40.0000 mg | DELAYED_RELEASE_TABLET | Freq: Every day | ORAL | Status: DC

## 2022-05-11 MED ORDER — METOPROLOL TARTRATE 5 MG/5ML IV SOLN: 2.0000 mg | INTRAVENOUS | Status: DC | PRN

## 2022-05-11 MED ORDER — MIDAZOLAM HCL 2 MG/2ML IJ SOLN
INTRAMUSCULAR | Status: AC
  Filled 2022-05-11: qty 2

## 2022-05-11 MED ORDER — ALUM & MAG HYDROXIDE-SIMETH 200-200-20 MG/5ML PO SUSP: 15.0000 mL | ORAL | Status: DC | PRN

## 2022-05-11 MED ORDER — ASCORBIC ACID 500 MG PO TABS
1000.0000 mg | ORAL_TABLET | Freq: Every day | ORAL | Status: DC
  Administered 2022-05-11 – 2022-05-15 (×5): 1000 mg via ORAL
  Filled 2022-05-11 (×5): qty 2

## 2022-05-11 MED ORDER — INSULIN ASPART 100 UNIT/ML IJ SOLN
3.0000 [IU] | Freq: Three times a day (TID) | INTRAMUSCULAR | Status: DC
  Administered 2022-05-11 – 2022-05-15 (×7): 3 [IU] via SUBCUTANEOUS

## 2022-05-11 MED ORDER — APIXABAN 5 MG PO TABS
5.0000 mg | ORAL_TABLET | Freq: Two times a day (BID) | ORAL | Status: DC
  Administered 2022-05-11 – 2022-05-15 (×8): 5 mg via ORAL
  Filled 2022-05-11 (×8): qty 1

## 2022-05-11 MED ORDER — CHLORHEXIDINE GLUCONATE CLOTH 2 % EX PADS
6.0000 | MEDICATED_PAD | Freq: Every day | CUTANEOUS | Status: DC
  Administered 2022-05-12 – 2022-05-13 (×2): 6 via TOPICAL

## 2022-05-11 MED ORDER — LABETALOL HCL 5 MG/ML IV SOLN: 10.0000 mg | INTRAVENOUS | Status: DC | PRN

## 2022-05-11 MED ORDER — OXYCODONE HCL 5 MG PO TABS
10.0000 mg | ORAL_TABLET | ORAL | Status: DC | PRN
  Administered 2022-05-11 – 2022-05-15 (×5): 15 mg via ORAL
  Filled 2022-05-11 (×5): qty 3

## 2022-05-11 MED ORDER — ONDANSETRON HCL 4 MG/2ML IJ SOLN
INTRAMUSCULAR | Status: AC
Start: 2022-05-11 — End: ?
  Filled 2022-05-11: qty 2

## 2022-05-11 MED ORDER — FENTANYL CITRATE (PF) 100 MCG/2ML IJ SOLN
INTRAMUSCULAR | Status: DC | PRN
  Administered 2022-05-11: 50 ug via INTRAVENOUS

## 2022-05-11 MED ORDER — METOPROLOL TARTRATE 12.5 MG HALF TABLET
ORAL_TABLET | ORAL | Status: AC
  Administered 2022-05-11: 25 mg via ORAL
  Filled 2022-05-11: qty 2

## 2022-05-11 MED ORDER — MAGNESIUM CITRATE PO SOLN: 1.0000 | Freq: Once | ORAL | Status: DC | PRN

## 2022-05-11 MED ORDER — JUVEN PO PACK
1.0000 | PACK | Freq: Two times a day (BID) | ORAL | Status: DC
  Administered 2022-05-11: 1 via ORAL
  Filled 2022-05-11 (×2): qty 1

## 2022-05-11 MED ORDER — METOPROLOL TARTRATE 25 MG PO TABS
25.0000 mg | ORAL_TABLET | Freq: Two times a day (BID) | ORAL | Status: DC
  Administered 2022-05-11 – 2022-05-15 (×6): 25 mg via ORAL
  Filled 2022-05-11 (×7): qty 1

## 2022-05-11 MED ORDER — ONDANSETRON HCL 4 MG/2ML IJ SOLN: 4.0000 mg | Freq: Four times a day (QID) | INTRAMUSCULAR | Status: DC | PRN

## 2022-05-11 MED ORDER — VASOPRESSIN 20 UNIT/ML IV SOLN
INTRAVENOUS | Status: AC
  Filled 2022-05-11: qty 1

## 2022-05-11 MED ORDER — TRANEXAMIC ACID-NACL 1000-0.7 MG/100ML-% IV SOLN
1000.0000 mg | INTRAVENOUS | Status: AC
  Administered 2022-05-11: 1000 mg via INTRAVENOUS
  Filled 2022-05-11: qty 100

## 2022-05-11 MED ORDER — MAGNESIUM SULFATE 2 GM/50ML IV SOLN: 2.0000 g | Freq: Every day | INTRAVENOUS | Status: DC | PRN

## 2022-05-11 MED ORDER — SODIUM CHLORIDE 0.9 % IV SOLN: INTRAVENOUS | Status: DC

## 2022-05-11 MED ORDER — HYDRALAZINE HCL 20 MG/ML IJ SOLN: 5.0000 mg | INTRAMUSCULAR | Status: DC | PRN

## 2022-05-11 MED ORDER — BISACODYL 5 MG PO TBEC: 5.0000 mg | DELAYED_RELEASE_TABLET | Freq: Every day | ORAL | Status: DC | PRN

## 2022-05-11 MED ORDER — ZINC SULFATE 220 (50 ZN) MG PO CAPS
220.0000 mg | ORAL_CAPSULE | Freq: Every day | ORAL | Status: DC
  Administered 2022-05-11 – 2022-05-15 (×5): 220 mg via ORAL
  Filled 2022-05-11 (×5): qty 1

## 2022-05-11 MED ORDER — NA FERRIC GLUC CPLX IN SUCROSE 12.5 MG/ML IV SOLN
125.0000 mg | INTRAVENOUS | Status: DC
  Administered 2022-05-14: 125 mg via INTRAVENOUS
  Filled 2022-05-11: qty 10

## 2022-05-11 MED ORDER — SODIUM CHLORIDE 0.9 % IV SOLN
125.0000 mg | INTRAVENOUS | Status: AC
  Administered 2022-05-12: 125 mg via INTRAVENOUS
  Filled 2022-05-11: qty 10

## 2022-05-11 MED ORDER — GABAPENTIN 300 MG PO CAPS
300.0000 mg | ORAL_CAPSULE | Freq: Three times a day (TID) | ORAL | Status: DC
  Administered 2022-05-11 – 2022-05-15 (×11): 300 mg via ORAL
  Filled 2022-05-11 (×12): qty 1

## 2022-05-11 MED ORDER — ORAL CARE MOUTH RINSE
15.0000 mL | Freq: Once | OROMUCOSAL | Status: AC
Start: 2022-05-11 — End: 2022-05-11

## 2022-05-11 MED ORDER — OXYCODONE HCL 5 MG PO TABS
5.0000 mg | ORAL_TABLET | ORAL | Status: DC | PRN
  Administered 2022-05-13 (×2): 10 mg via ORAL
  Administered 2022-05-14: 5 mg via ORAL
  Filled 2022-05-11: qty 2
  Filled 2022-05-11: qty 1
  Filled 2022-05-11 (×2): qty 2
  Filled 2022-05-11: qty 1

## 2022-05-11 MED ORDER — MIDAZOLAM HCL 5 MG/5ML IJ SOLN
INTRAMUSCULAR | Status: DC | PRN
  Administered 2022-05-11: 2 mg via INTRAVENOUS

## 2022-05-11 MED ORDER — GUAIFENESIN-DM 100-10 MG/5ML PO SYRP: 15.0000 mL | ORAL_SOLUTION | ORAL | Status: DC | PRN

## 2022-05-11 MED ORDER — PROPOFOL 10 MG/ML IV BOLUS
INTRAVENOUS | Status: AC
  Filled 2022-05-11: qty 20

## 2022-05-11 MED ORDER — DOCUSATE SODIUM 100 MG PO CAPS
100.0000 mg | ORAL_CAPSULE | Freq: Every day | ORAL | Status: DC
  Administered 2022-05-12 – 2022-05-15 (×4): 100 mg via ORAL
  Filled 2022-05-11 (×4): qty 1

## 2022-05-11 MED ORDER — SUCCINYLCHOLINE CHLORIDE 200 MG/10ML IV SOSY
PREFILLED_SYRINGE | INTRAVENOUS | Status: DC | PRN
  Administered 2022-05-11: 80 mg via INTRAVENOUS

## 2022-05-11 MED ORDER — ONDANSETRON HCL 4 MG/2ML IJ SOLN
INTRAMUSCULAR | Status: DC | PRN
  Administered 2022-05-11: 4 mg via INTRAVENOUS

## 2022-05-11 MED ORDER — DEXAMETHASONE SODIUM PHOSPHATE 10 MG/ML IJ SOLN
INTRAMUSCULAR | Status: DC | PRN
  Administered 2022-05-11: 5 mg via INTRAVENOUS

## 2022-05-11 MED ORDER — FUROSEMIDE 40 MG PO TABS
80.0000 mg | ORAL_TABLET | ORAL | Status: DC
  Administered 2022-05-12 – 2022-05-15 (×3): 80 mg via ORAL
  Filled 2022-05-11 (×3): qty 2

## 2022-05-11 MED ORDER — POTASSIUM CHLORIDE CRYS ER 20 MEQ PO TBCR: 20.0000 meq | EXTENDED_RELEASE_TABLET | Freq: Every day | ORAL | Status: DC | PRN

## 2022-05-11 MED ORDER — PROMETHAZINE HCL 25 MG/ML IJ SOLN: 6.2500 mg | INTRAMUSCULAR | Status: DC | PRN

## 2022-05-11 MED ORDER — FENTANYL CITRATE (PF) 100 MCG/2ML IJ SOLN
25.0000 ug | INTRAMUSCULAR | Status: DC | PRN
  Administered 2022-05-11 (×3): 50 ug via INTRAVENOUS

## 2022-05-11 MED ORDER — ATORVASTATIN CALCIUM 10 MG PO TABS
20.0000 mg | ORAL_TABLET | Freq: Every day | ORAL | Status: DC
  Administered 2022-05-11 – 2022-05-14 (×4): 20 mg via ORAL
  Filled 2022-05-11 (×4): qty 2

## 2022-05-11 MED ORDER — DEXAMETHASONE SODIUM PHOSPHATE 10 MG/ML IJ SOLN
INTRAMUSCULAR | Status: AC
  Filled 2022-05-11: qty 1

## 2022-05-11 MED ORDER — INSULIN ASPART 100 UNIT/ML IJ SOLN
0.0000 [IU] | Freq: Three times a day (TID) | INTRAMUSCULAR | Status: DC
  Administered 2022-05-11 – 2022-05-12 (×2): 2 [IU] via SUBCUTANEOUS
  Administered 2022-05-12: 3 [IU] via SUBCUTANEOUS
  Administered 2022-05-13: 2 [IU] via SUBCUTANEOUS
  Administered 2022-05-13: 1 [IU] via SUBCUTANEOUS
  Administered 2022-05-14 (×2): 2 [IU] via SUBCUTANEOUS
  Administered 2022-05-15: 3 [IU] via SUBCUTANEOUS

## 2022-05-11 MED ORDER — APIXABAN 5 MG PO TABS: 5.0000 mg | ORAL_TABLET | Freq: Two times a day (BID) | ORAL | Status: DC

## 2022-05-11 SURGICAL SUPPLY — 46 items
BAG COUNTER SPONGE SURGICOUNT (BAG) ×1 IMPLANT
BAG SPNG CNTER NS LX DISP (BAG) ×2
BLADE SAW RECIP 87.9 MT (BLADE) ×3 IMPLANT
BLADE SURG 21 STRL SS (BLADE) ×3 IMPLANT
BNDG COHESIVE 6X5 TAN STRL LF (GAUZE/BANDAGES/DRESSINGS) IMPLANT
CANISTER WOUND CARE 500ML ATS (WOUND CARE) ×3 IMPLANT
COVER SURGICAL LIGHT HANDLE (MISCELLANEOUS) ×3 IMPLANT
CUFF TOURN SGL QUICK 34 (TOURNIQUET CUFF) ×3
CUFF TRNQT CYL 34X4.125X (TOURNIQUET CUFF) ×2 IMPLANT
DRAPE INCISE IOBAN 66X45 STRL (DRAPES) ×3 IMPLANT
DRAPE U-SHAPE 47X51 STRL (DRAPES) ×3 IMPLANT
DRESSING PREVENA PLUS CUSTOM (GAUZE/BANDAGES/DRESSINGS) ×2 IMPLANT
DRSG PREVENA PLUS CUSTOM (GAUZE/BANDAGES/DRESSINGS) ×3
DURAPREP 26ML APPLICATOR (WOUND CARE) ×3 IMPLANT
ELECT REM PT RETURN 9FT ADLT (ELECTROSURGICAL) ×3
ELECTRODE REM PT RTRN 9FT ADLT (ELECTROSURGICAL) ×2 IMPLANT
GLOVE BIOGEL PI IND STRL 6.5 (GLOVE) IMPLANT
GLOVE BIOGEL PI IND STRL 8 (GLOVE) IMPLANT
GLOVE BIOGEL PI IND STRL 9 (GLOVE) ×2 IMPLANT
GLOVE BIOGEL PI INDICATOR 6.5 (GLOVE) ×1
GLOVE BIOGEL PI INDICATOR 8 (GLOVE) ×1
GLOVE BIOGEL PI INDICATOR 9 (GLOVE) ×1
GLOVE ORTHOPEDIC STR SZ6.5 (GLOVE) ×1 IMPLANT
GLOVE SURG ORTHO 8.0 STRL STRW (GLOVE) ×1 IMPLANT
GLOVE SURG ORTHO 9.0 STRL STRW (GLOVE) ×3 IMPLANT
GOWN STRL REUS W/ TWL XL LVL3 (GOWN DISPOSABLE) ×4 IMPLANT
GOWN STRL REUS W/TWL XL LVL3 (GOWN DISPOSABLE) ×9
GRAFT SKIN WND MICRO 38 (Tissue) ×1 IMPLANT
KIT BASIN OR (CUSTOM PROCEDURE TRAY) ×3 IMPLANT
KIT TURNOVER KIT B (KITS) ×3 IMPLANT
MANIFOLD NEPTUNE II (INSTRUMENTS) ×3 IMPLANT
NS IRRIG 1000ML POUR BTL (IV SOLUTION) ×3 IMPLANT
PACK ORTHO EXTREMITY (CUSTOM PROCEDURE TRAY) ×3 IMPLANT
PAD ARMBOARD 7.5X6 YLW CONV (MISCELLANEOUS) ×3 IMPLANT
PREVENA RESTOR ARTHOFORM 33X30 (CANNISTER) ×1 IMPLANT
PREVENA RESTOR ARTHOFORM 46X30 (CANNISTER) ×3 IMPLANT
SPONGE T-LAP 18X18 ~~LOC~~+RFID (SPONGE) ×1 IMPLANT
STAPLER VISISTAT 35W (STAPLE) ×1 IMPLANT
STOCKINETTE IMPERVIOUS LG (DRAPES) ×3 IMPLANT
SUT ETHILON 2 0 PSLX (SUTURE) ×2 IMPLANT
SUT SILK 2 0 (SUTURE) ×3
SUT SILK 2-0 18XBRD TIE 12 (SUTURE) ×2 IMPLANT
SUT VIC AB 1 CTX 27 (SUTURE) ×6 IMPLANT
TOWEL GREEN STERILE (TOWEL DISPOSABLE) ×3 IMPLANT
TUBE CONNECTING 12X1/4 (SUCTIONS) ×3 IMPLANT
YANKAUER SUCT BULB TIP NO VENT (SUCTIONS) ×3 IMPLANT

## 2022-05-11 NOTE — Anesthesia Postprocedure Evaluation (Signed)
Anesthesia Post Note  Patient: Alexander [name]  Procedure(s) Performed: LEFT BELOW KNEE AMPUTATION (Left: Knee) APPLICATION OF WOUND VAC (Left: Leg Lower)     Patient location during evaluation: PACU Anesthesia Type: General Level of consciousness: sedated and patient cooperative Pain management: pain level controlled Vital Signs Assessment: post-procedure vital signs reviewed and stable Respiratory status: spontaneous breathing Cardiovascular status: stable Anesthetic complications: no   No notable events documented.  Last Vitals:  Vitals:   05/11/22 1100 05/11/22 1115  BP: 105/72 97/68  Pulse: (!) 101 97  Resp: 11 16  Temp:  36.6 C  SpO2: 96% 96%    Last Pain:  Vitals:   05/11/22 1115  TempSrc:   PainSc: [place]

## 2022-05-11 NOTE — Consult Note (Signed)
Renal Service Consult Note [facility]  Alexander [name] 05/11/2022 Sol Blazing, MD Requesting Physician: [name]  Reason for Consult: ESRD pt sp L BKA HPI: The patient is a 61 y.o. year-old w/ hx of anemia, atrial fib, CAD, DM2, esrd on HD tts, HTN, HL, R AKA, sp PPM who presented for non-healing L leg wounds/ gangrene / infection. Recently is sp L TMA. Hx of prior R AKA. Pt admitted for L BKA which was done this morning. Pt on HD MWF. Asked to see for dialysis.    Pt seen in room. No c/o after surgery, awake and watching tv.  No c/o today. Thinks the surgery went well. No SOb or CP.   ROS - denies CP, no joint pain, no HA, no blurry vision, no rash, no diarrhea, no nausea/ vomiting, no dysuria, no difficulty voiding   Past Medical History  Past Medical History:  Diagnosis Date   Acute blood loss anemia    Alcohol abuse    Anemia    Anemia of chronic disease    Arthritis    "patient does not think so."   Atrial fibrillation ([place])    Cardiac syncope 10/07/2014   rapid A fib with 8 sec pauses on converison with syncope- temp pacing wire placed then PPM   Cataract    BILATERAL   Coronary artery disease    with stents   Diabetes mellitus    dx---been  awhile   Elevated LFTs    Endocarditis of tricuspid valve    ESRD (end stage renal disease) (HCC)    M/W/F   GERD (gastroesophageal reflux disease)    History of blood transfusion    Hyperlipemia    Hypertension    Malnutrition ([place])    Osteomyelitis (HCC)    right foot   Peripheral vascular disease ([place])    Pneumonia    Presence of permanent cardiac pacemaker 10/08/2014   [employer]   Tricuspid valve vegetation    Past Surgical History  Past Surgical History:  Procedure Laterality Date   A/V FISTULAGRAM Right 05/01/2021   Procedure: A/V FISTULAGRAM;  Surgeon: Waynetta Sandy, MD;  Location: [place] [facility];  Service: Cardiovascular;  Laterality: Right;  failed fistulagram   ABDOMINAL  AORTOGRAM W/LOWER EXTREMITY N/A 09/09/2017   Procedure: ABDOMINAL AORTOGRAM W/LOWER EXTREMITY;  Surgeon: Angelia Mould, MD;  Location: [place] [facility];  Service: Cardiovascular;  Laterality: N/A;   ABDOMINAL AORTOGRAM W/LOWER EXTREMITY Right 11/11/2019   Procedure: ABDOMINAL AORTOGRAM W/LOWER EXTREMITY;  Surgeon: Marty Heck, MD;  Location: [place] [facility];  Service: Cardiovascular;  Laterality: Right;   ABDOMINAL AORTOGRAM W/LOWER EXTREMITY N/A 01/26/2022   Procedure: ABDOMINAL AORTOGRAM W/LOWER EXTREMITY;  Surgeon: Adrian Prows, MD;  Location: [place] [facility];  Service: Cardiovascular;  Laterality: N/A;   AMPUTATION Left 08/19/2013   Procedure: AMPUTATION RAY;  Surgeon: Alta Corning, MD;  Location: [place];  Service: Orthopedics;  Laterality: Left;  ray amputation left 5th   AMPUTATION Left 10/27/2013   Procedure: AMPUTATION DIGIT-LEFT 4TH TOE, 4th and 5th metatarsal.;  Surgeon: Angelia Mould, MD;  Location: [place];  Service: Vascular;  Laterality: Left;   AMPUTATION Left 11/04/2013   Procedure: LEFT FOOT TRANS-METATARSAL AMPUTATION WITH WOUND CLOSURE ;  Surgeon: Alta Corning, MD;  Location: [place];  Service: Orthopedics;  Laterality: Left;   AMPUTATION Right 09/10/2017   Procedure: RIGHT FOURTH AND FIFTH TOE AMPUTATION;  Surgeon: Angelia Mould, MD;  Location:  [place] OR;  Service: Vascular;  Laterality: Right;   AMPUTATION Right 02/19/2020   Procedure: AMPUTATION RIGHT 3RD TOE;  Surgeon: Angelia Mould, MD;  Location: [place];  Service: Vascular;  Laterality: Right;   AMPUTATION Right 03/11/2020   Procedure: RIGHT BELOW KNEE AMPUTATION;  Surgeon: Newt Minion, MD;  Location: [place];  Service: Orthopedics;  Laterality: Right;   AMPUTATION Right 01/31/2022   Procedure: RIGHT ABOVE KNEE AMPUTATION;  Surgeon: Newt Minion, MD;  Location: [place];  Service: Orthopedics;  Laterality: Right;   APPLICATION OF WOUND VAC Right 02/19/2020   Procedure: Application  Of Wound Vac Right foot;  Surgeon: Angelia Mould, MD;  Location: [place];  Service: Vascular;  Laterality: Right;   AV FISTULA PLACEMENT Right 03/27/2021   Procedure: RIGHT ARM ARTERIOVENOUS (AV) FISTULA;  Surgeon: Angelia Mould, MD;  Location: [place];  Service: Vascular;  Laterality: Right;   AV FISTULA PLACEMENT Right 05/10/2021   Procedure: RIGHT ARM ARTERIOVENOUS (AV) FISTULA;  Surgeon: Waynetta Sandy, MD;  Location: [place];  Service: Vascular;  Laterality: Right;   [place] Right 08/11/2021   Procedure: RIGHT ARM SECOND STAGE BASILIC VEIN TRANSPOSITION;  Surgeon: Waynetta Sandy, MD;  Location: [place];  Service: Vascular;  Laterality: Right;   BIOPSY  07/20/2020   Procedure: BIOPSY;  Surgeon: Irving Copas., MD;  Location: [place];  Service: Gastroenterology;;   CARDIAC CATHETERIZATION  10/07/2014   Procedure: LEFT HEART CATH AND CORONARY ANGIOGRAPHY;  Surgeon: Laverda Page, MD;  Location: [facility] C[facility] LAB;  Service: Cardiovascular;;   COLONOSCOPY W/ POLYPECTOMY     COLONOSCOPY WITH PROPOFOL N/A 05/25/2020   Procedure: COLONOSCOPY WITH PROPOFOL;  Surgeon: Gatha Mayer, MD;  Location: [facility];  Service: Endoscopy;  Laterality: N/A;   EMBOLECTOMY Right 09/12/2017   Procedure: Thrombectomy  & Redo Right Below Knee Popliteal Artery Bypass Graft  ;  Surgeon: Waynetta Sandy, MD;  Location: [facility] OR;  Service: Vascular;  Laterality: Right;   ENTEROSCOPY N/A 07/20/2020   Procedure: ENTEROSCOPY;  Surgeon: Rush Landmark Telford Nab., MD;  Location: [place];  Service: Gastroenterology;  Laterality: N/A;   ESOPHAGOGASTRODUODENOSCOPY (EGD) WITH PROPOFOL N/A 05/24/2020   Procedure: ESOPHAGOGASTRODUODENOSCOPY (EGD) WITH PROPOFOL;  Surgeon: Ladene Artist, MD;  Location: [facility];  Service: Endoscopy;  Laterality: N/A;   EYE SURGERY Bilateral    cataract   FEMORAL-TIBIAL BYPASS GRAFT Left 10/27/2013    Procedure: BYPASS GRAFT LEFT FEMORAL- POSTERIOR TIBIAL ARTERY;  Surgeon: Angelia Mould, MD;  Location: [place];  Service: Vascular;  Laterality: Left;   FEMORAL-TIBIAL BYPASS GRAFT Right 09/10/2017   Procedure: RIGHT SUPERFICIAL  FEMORAL ARTERY-BELOW KNEE POPLITEAL ARTERY BYPASS GRAFT WITH VEIN;  Surgeon: Angelia Mould, MD;  Location: [place];  Service: Vascular;  Laterality: Right;   GIVENS CAPSULE STUDY N/A 07/20/2020   Procedure: GIVENS CAPSULE STUDY;  Surgeon: Irving Copas., MD;  Location: [place];  Service: Gastroenterology;  Laterality: N/A;  capsule placed through scope into duodenum @ 0936   I & D EXTREMITY Left 10/27/2013   Procedure: IRRIGATION AND DEBRIDEMENT EXTREMITY- LEFT FOOT;  Surgeon: Angelia Mould, MD;  Location: [place];  Service: Vascular;  Laterality: Left;   I & D EXTREMITY Right 01/26/2020   Procedure: IRRIGATION AND DEBRIDEMENT EXTREMITY RIGHT FOOT WOUND;  Surgeon: Angelia Mould, MD;  Location: [place];  Service: Vascular;  Laterality: Right;   INSERT / REPLACE / REMOVE  PACEMAKER     INSERTION OF DIALYSIS CATHETER Right 11/03/2021   Procedure: RIGHT INTERNAL JUGULAR INSERTION OF TUNNELED DIALYSIS CATHETER;  Surgeon: Angelia Mould, MD;  Location: [place];  Service: Vascular;  Laterality: Right;   INTRAOPERATIVE ARTERIOGRAM Right 09/10/2017   Procedure: INTRA OPERATIVE ARTERIOGRAM;  Surgeon: Angelia Mould, MD;  Location: [place];  Service: Vascular;  Laterality: Right;   IR ANGIOGRAM FOLLOW UP STUDY  09/09/2017   IR FLUORO GUIDE CV LINE RIGHT  05/31/2020   IR LUMBAR DISC ASPIRATION W/IMG GUIDE  01/19/2022   IR US GUIDE VASC ACCESS RIGHT  06/01/2020   LEFT HEART CATH AND CORONARY ANGIOGRAPHY N/A 08/19/2020   Procedure: LEFT HEART CATH AND CORONARY ANGIOGRAPHY;  Surgeon: Adrian Prows, MD;  Location: [place] [facility];  Service: Cardiovascular;  Laterality: N/A;   LEFT HEART CATHETERIZATION WITH CORONARY ANGIOGRAM N/A  11/09/2014   Procedure: LEFT HEART CATHETERIZATION WITH CORONARY ANGIOGRAM;  Surgeon: Laverda Page, MD;  Location: [facility] CATH LAB;  Service: Cardiovascular;  Laterality: N/A;   LOWER EXTREMITY ANGIOGRAM Right 11/08/2015   Procedure: Lower Extremity Angiogram;  Surgeon: Serafina Mitchell, MD;  Location: [place] [facility];  Service: Cardiovascular;  Laterality: Right;   LUMBAR LAMINECTOMY  06/13/2012   Procedure: MICRODISCECTOMY LUMBAR LAMINECTOMY;  Surgeon: Jessy Oto, MD;  Location: [place];  Service: Orthopedics;  Laterality: N/A;  Central laminectomy L2-3, L3-4, L4-5   PACEMAKER INSERTION  10/08/2014   MDT Advisa MRI compatible dual chamber pacemaker implanted by Dr Caryl Comes for syncope with post-termination pauses   PERCUTANEOUS CORONARY STENT INTERVENTION (PCI-S)  11/09/2014   des to lad & distal circumflex         Dr  [name]   PERIPHERAL VASCULAR BALLOON ANGIOPLASTY Right 11/11/2019   Procedure: PERIPHERAL VASCULAR BALLOON ANGIOPLASTY;  Surgeon: Marty Heck, MD;  Location: [place] [facility];  Service: Cardiovascular;  Laterality: Right;  below knee popliteal, tibioperoneal trunk, posterior tibial arteries   PERIPHERAL VASCULAR CATHETERIZATION N/A 11/08/2015   Procedure: Abdominal Aortogram;  Surgeon: Serafina Mitchell, MD;  Location: [place] [facility];  Service: Cardiovascular;  Laterality: N/A;   PERIPHERAL VASCULAR CATHETERIZATION Right 11/08/2015   Procedure: Peripheral Vascular Atherectomy;  Surgeon: Serafina Mitchell, MD;  Location: [place] [facility];  Service: Cardiovascular;  Laterality: Right;   PERIPHERAL VASCULAR CATHETERIZATION N/A 10/10/2016   Procedure: Lower Extremity Angiography;  Surgeon: Waynetta Sandy, MD;  Location: [place] [facility];  Service: Cardiovascular;  Laterality: N/A;   PERIPHERAL VASCULAR CATHETERIZATION Right 10/10/2016   Procedure: Peripheral Vascular Atherectomy;  Surgeon: Waynetta Sandy, MD;  Location: [place] [facility];  Service:  Cardiovascular;  Laterality: Right;  Popliteal   PERMANENT PACEMAKER INSERTION N/A 10/08/2014   Procedure: PERMANENT PACEMAKER INSERTION;  Surgeon: Deboraha Sprang, MD;  Location: [facility] CATH LAB;  Service: Cardiovascular;  Laterality: N/A;   RADIOLOGY WITH ANESTHESIA N/A 01/19/2022   Procedure: Disc Aspiration;  Surgeon: Pedro Earls, MD;  Location: [place];  Service: Radiology;  Laterality: N/A;   SUBMUCOSAL TATTOO INJECTION  07/20/2020   Procedure: SUBMUCOSAL TATTOO INJECTION;  Surgeon: Irving Copas., MD;  Location: [place];  Service: Gastroenterology;;   TEE WITHOUT CARDIOVERSION N/A 10/27/2021   Procedure: TRANSESOPHAGEAL ECHOCARDIOGRAM (TEE);  Surgeon: Adrian Prows, MD;  Location: [facility] ENDOSCOPY;  Service: Cardiovascular;  Laterality: N/A;   TEMPORARY PACEMAKER INSERTION Bilateral 10/07/2014   Procedure: TEMPORARY PACEMAKER INSERTION;  Surgeon: Laverda Page, MD;  Location: [facility] CATH LAB;  Service: Cardiovascular;  Laterality: Bilateral;   WOUND DEBRIDEMENT Right 02/19/2020   Procedure: DEBRIDEMENT WOUND RIGHT FOOT;  Surgeon: Angelia Mould, MD;  Location: [facility];  Service: Vascular;  Laterality: Right;   Family History  Family History  Problem Relation Age of Onset   Diabetes type II Mother    Hypertension Mother    Diabetes Mother    Liver cancer Father    Diabetes type II Sister    Breast cancer Sister    Diabetes Sister    Hypertension Sister    Diabetes type II Brother    Kidney failure Brother    Diabetes Brother    Hypertension Brother    Diabetes type II Sister    Social History  reports that he has been smoking cigars. He has been exposed to tobacco smoke. He has never used smokeless tobacco. He reports that he does not currently use alcohol after a past usage of about 21.0 standard drinks of alcohol per week. He reports that he does not use drugs. Allergies  Allergies  Allergen Reactions   Cytoxan [Cyclophosphamide] Other (See Comments)     Pancytopenia   Pletal [Cilostazol] Palpitations and Other (See Comments)    "Can hear heart beating loudly". Other reaction(s): Unknown   Keflex [Cephalexin] Nausea And Vomiting    Other reaction(s): Unknown   Tylenol [Acetaminophen] Other (See Comments)    Patient has a cold sweat   Iron Sucrose     Other reaction(s): Unknown   Sulfa Antibiotics Rash    Other reaction(s): Unknown   Home medications Prior to Admission medications   Medication Sig Start Date End Date Taking? Authorizing Provider  amoxicillin (AMOXIL) 500 MG capsule Take 1 capsule (500 mg total) by mouth at bedtime. 11/22/21 11/17/22 Yes Vu, Rockey Situ, MD  apixaban (ELIQUIS) 5 MG TABS tablet Take 1 tablet (5 mg total) by mouth 2 (two) times daily. 12/11/21 05/09/25 Yes Cantwell, Celeste C, PA-C  atorvastatin (LIPITOR) 20 MG tablet Take 1 tablet (20 mg total) by mouth at bedtime. 10/01/21  Yes Aline August, MD  doxycycline (VIBRA-TABS) 100 MG tablet Take 100 mg by mouth 2 (two) times daily. 12/21/21  Yes [provider]  furosemide (LASIX) 80 MG tablet Take 80 mg by mouth See admin instructions. Take 80 mg by mouth on (non-dialysis days) Tuesdays, Thursdays, Saturdays & Sundays in the morning. 02/26/21  Yes [provider]  gabapentin (NEURONTIN) 300 MG capsule Take 1 capsule (300 mg total) by mouth 3 (three) times daily. 3 times a day when necessary neuropathy pain 03/01/22  Yes Newt Minion, MD  HUMULIN 70/30 KWIKPEN (70-30) 100 UNIT/ML KwikPen Inject 0-20 Units into the skin in the morning and at bedtime. Sliding scale (if less than 130=no insulin) 02/26/22  Yes [provider]  lidocaine-prilocaine (EMLA) cream Apply 1 application  topically as needed (prior to fistula access).   Yes [provider]  metoprolol tartrate (LOPRESSOR) 25 MG tablet Take 1 tablet (25 mg total) by mouth 2 (two) times daily. 05/24/21 05/19/22 Yes Adrian Prows, MD  oxyCODONE-acetaminophen (PERCOCET/ROXICET) 5-325 MG  tablet Take 1 tablet by mouth every 4 (four) hours as needed for severe pain. 04/06/22  Yes Newt Minion, MD  pantoprazole (PROTONIX) 40 MG tablet Take 40 mg by mouth daily.   Yes [provider]  ACCU-CHEK FASTCLIX LANCETS MISC Check blood sugar TID & QHS 10/01/14   Advani, Vernon Prey, MD  blood glucose meter kit and supplies KIT Check blood sugar TID &  QHS 02/10/15   Lorayne Marek, MD  Blood Glucose Monitoring Suppl (ACCU-CHEK ADVANTAGE DIABETES) kit Use as instructed 10/01/14   Lorayne Marek, MD  gabapentin (NEURONTIN) 100 MG capsule Take 2 capsules (200 mg total) by mouth at bedtime. Patient not taking: Reported on 05/09/2022 02/12/22   Caren Griffins, MD  glucose blood (ACCU-CHEK AVIVA PLUS) test strip Use as instructed 10/01/14   Lorayne Marek, MD  glucose blood (CHOICE DM FORA G20 TEST STRIPS) test strip Check blood sugar TID & QHS 02/10/15   Advani, Vernon Prey, MD  glucose blood test strip Use as instructed 11/20/13   Reyne Dumas, MD  HYDROmorphone (DILAUDID) 2 MG tablet Take 1 tablet (2 mg total) by mouth every 2 (two) hours as needed for severe pain. Patient not taking: Reported on 05/09/2022 02/12/22   Caren Griffins, MD  methocarbamol (ROBAXIN) 500 MG tablet Take 1 tablet (500 mg total) by mouth every 8 (eight) hours as needed for muscle spasms. Patient not taking: Reported on 05/09/2022 02/12/22   Caren Griffins, MD  Methoxy PEG-Epoetin Beta (MIRCERA IJ) Mircera 04/04/22 04/03/23  [provider]  oxycodone (OXY-IR) 5 MG capsule Take 1 capsule (5 mg total) by mouth every 4 (four) hours as needed. Patient not taking: Reported on 05/09/2022 02/28/22   Suzan Slick, NP     Vitals:   05/11/22 1045 05/11/22 1100 05/11/22 1115 05/11/22 1146  BP: 116/90 105/72 97/68 93/73  Pulse: 85 (!) 101 97 88  Resp: _0 Temp:   97.8 F (36.6 C)   TempSrc:      SpO2: 97% 96% 96% 93%   Exam Gen alert, no distress, calm No rash, cyanosis or gangrene Sclera anicteric, throat  clear  No jvd or bruits Chest clear bilat to bases, no rales/ wheezing RRR no MRG Abd soft ntnd no mass or ascites +bs GU normal male MS R AKA w/o edema Ext L BKA is wrapped, no hip edema or UE edema Neuro is alert, Ox 3 , nf    RUA AVF +bruit    Home meds include - apixaban, atrovastatin, furosemide 10m non hd days, gabapentin 300 tid, humulin 70/30, metoprolol 25 bid, percocet prn, pantoprazole, hydromorphone prn, oxy IR prn, prns/ vits/ supps     OP HD: MWF GOC 4h 13m  400/800  85kg  2/2.5 bath  RUA AVF  heparin none - last HD 6/28, good compliance - post wts 85- 86kg, except last HD 87kg - soft BP's in general - mircera 225 q2, last 6/21 - iron sucrose 10066m hd thru 7/10 - last Hb 8.9 on 6/28, tsat 17%, ferr 1030   Assessment/ Plan: SP L BKA - done today 6/30 by Dr DudSharol Givenr gangrenous/ non-healing wounds SP recent R AKA - in march 2023 Atrial fib - takes metoprolol 25 bid and eliquis  ESRD - on HD MWF. Will postpone HD to tomorrow since vol / labs are stable and pt had big surgery this am.   HTN/ vol - came off Wed 2-3kg up. Stable on 4 L [place] today, will get CXR and plan max UF 3-4 L w/ HD tomorrow. BP's soft.  Anemia of CKD - Hb 11.9, last OP esa was 6/21, next due is 7/05. Will continue 100m26m each HD thru 7/10.  MBD ckd  - will get phos / Ca in am tomorrow. Not on vdra, not sure of binder Nutrition - Renal diet on fluid restriction. Albumin is low. On protein  supps, renal vitamins  DMT2-per primary PPM infection: hx of suspected vegetation on valve leaflet, he is on chronic prophylactic indefinitely (amoxicillin 500 qhs). Follows with cardiology.       Kelly Splinter  MD 05/11/2022, 1:02 PM Recent Labs  Lab 05/11/22 0840  HGB 11.9*  CREATININE 3.00*  K 3.3*

## 2022-05-11 NOTE — H&P (Signed)
Alexander [name] is an 61 y.o. male.   Chief Complaint: Purulent drainage left foot with gangrenous changes to the left hindfoot and left leg. HPI: Patient is a 61 year old gentleman who is 3 months status post right above-knee amputation and recently status post left transmetatarsal amputation.  Patient has had difficulty with wound healing and has had progressive gangrenous changes to the left lower extremity.  Patient receives dialysis Monday Wednesday Friday.  Past Medical History:  Diagnosis Date   Acute blood loss anemia    Alcohol abuse    Anemia    Anemia of chronic disease    Arthritis    "patient does not think so."   Atrial fibrillation ([place])    Cardiac syncope 10/07/2014   rapid A fib with 8 sec pauses on converison with syncope- temp pacing wire placed then PPM   Cataract    BILATERAL   Coronary artery disease    with stents   Diabetes mellitus    dx---been  awhile   Elevated LFTs    Endocarditis of tricuspid valve    ESRD (end stage renal disease) ([place])    M/W/F   GERD (gastroesophageal reflux disease)    History of blood transfusion    Hyperlipemia    Hypertension    Malnutrition ([place])    Osteomyelitis ([place])    right foot   Peripheral vascular disease ([place])    Pneumonia    Presence of permanent cardiac pacemaker 10/08/2014   [employer]   Tricuspid valve vegetation     Past Surgical History:  Procedure Laterality Date   A/V FISTULAGRAM Right 05/01/2021   Procedure: A/V FISTULAGRAM;  Surgeon: Waynetta Sandy, MD;  Location: [place] [facility];  Service: Cardiovascular;  Laterality: Right;  failed fistulagram   ABDOMINAL AORTOGRAM W/LOWER EXTREMITY N/A 09/09/2017   Procedure: ABDOMINAL AORTOGRAM W/LOWER EXTREMITY;  Surgeon: Angelia Mould, MD;  Location: [place] [facility];  Service: Cardiovascular;  Laterality: N/A;   ABDOMINAL AORTOGRAM W/LOWER EXTREMITY Right 11/11/2019   Procedure: ABDOMINAL AORTOGRAM W/LOWER EXTREMITY;  Surgeon: Marty Heck, MD;  Location: [place] [facility];  Service: Cardiovascular;  Laterality: Right;   ABDOMINAL AORTOGRAM W/LOWER EXTREMITY N/A 01/26/2022   Procedure: ABDOMINAL AORTOGRAM W/LOWER EXTREMITY;  Surgeon: Adrian Prows, MD;  Location: [place] [facility];  Service: Cardiovascular;  Laterality: N/A;   AMPUTATION Left 08/19/2013   Procedure: AMPUTATION RAY;  Surgeon: Alta Corning, MD;  Location: [place];  Service: Orthopedics;  Laterality: Left;  ray amputation left 5th   AMPUTATION Left 10/27/2013   Procedure: AMPUTATION DIGIT-LEFT 4TH TOE, 4th and 5th metatarsal.;  Surgeon: Angelia Mould, MD;  Location: [place];  Service: Vascular;  Laterality: Left;   AMPUTATION Left 11/04/2013   Procedure: LEFT FOOT TRANS-METATARSAL AMPUTATION WITH WOUND CLOSURE ;  Surgeon: Alta Corning, MD;  Location: [place];  Service: Orthopedics;  Laterality: Left;   AMPUTATION Right 09/10/2017   Procedure: RIGHT FOURTH AND FIFTH TOE AMPUTATION;  Surgeon: Angelia Mould, MD;  Location: [place];  Service: Vascular;  Laterality: Right;   AMPUTATION Right 02/19/2020   Procedure: AMPUTATION RIGHT 3RD TOE;  Surgeon: Angelia Mould, MD;  Location: [place];  Service: Vascular;  Laterality: Right;   AMPUTATION Right 03/11/2020   Procedure: RIGHT BELOW KNEE AMPUTATION;  Surgeon: Newt Minion, MD;  Location: [place];  Service: Orthopedics;  Laterality: Right;   AMPUTATION Right 01/31/2022   Procedure: RIGHT ABOVE KNEE AMPUTATION;  Surgeon: Newt Minion, MD;  Location: [place];  Service: Orthopedics;  Laterality: Right;   APPLICATION OF WOUND VAC Right 02/19/2020   Procedure: Application Of Wound Vac Right foot;  Surgeon: Angelia Mould, MD;  Location: [place];  Service: Vascular;  Laterality: Right;   AV FISTULA PLACEMENT Right 03/27/2021   Procedure: RIGHT ARM ARTERIOVENOUS (AV) FISTULA;  Surgeon: Angelia Mould, MD;  Location: [place];  Service: Vascular;  Laterality: Right;   AV FISTULA PLACEMENT  Right 05/10/2021   Procedure: RIGHT ARM ARTERIOVENOUS (AV) FISTULA;  Surgeon: Waynetta Sandy, MD;  Location: [place];  Service: Vascular;  Laterality: Right;   [place] Right 08/11/2021   Procedure: RIGHT ARM SECOND STAGE BASILIC VEIN TRANSPOSITION;  Surgeon: Waynetta Sandy, MD;  Location: [place];  Service: Vascular;  Laterality: Right;   BIOPSY  07/20/2020   Procedure: BIOPSY;  Surgeon: Irving Copas., MD;  Location: [place];  Service: Gastroenterology;;   CARDIAC CATHETERIZATION  10/07/2014   Procedure: LEFT HEART CATH AND CORONARY ANGIOGRAPHY;  Surgeon: Laverda Page, MD;  Location: [facility];  Service: Cardiovascular;;   COLONOSCOPY W/ POLYPECTOMY     COLONOSCOPY WITH PROPOFOL N/A 05/25/2020   Procedure: COLONOSCOPY WITH PROPOFOL;  Surgeon: Gatha Mayer, MD;  Location: [facility];  Service: Endoscopy;  Laterality: N/A;   EMBOLECTOMY Right 09/12/2017   Procedure: Thrombectomy  & Redo Right Below Knee Popliteal Artery Bypass Graft  ;  Surgeon: Waynetta Sandy, MD;  Location: [facility];  Service: Vascular;  Laterality: Right;   ENTEROSCOPY N/A 07/20/2020   Procedure: ENTEROSCOPY;  Surgeon: Rush Landmark Telford Nab., MD;  Location: [place];  Service: Gastroenterology;  Laterality: N/A;   ESOPHAGOGASTRODUODENOSCOPY (EGD) WITH PROPOFOL N/A 05/24/2020   Procedure: ESOPHAGOGASTRODUODENOSCOPY (EGD) WITH PROPOFOL;  Surgeon: Ladene Artist, MD;  Location: [facility];  Service: Endoscopy;  Laterality: N/A;   EYE SURGERY Bilateral    cataract   FEMORAL-TIBIAL BYPASS GRAFT Left 10/27/2013   Procedure: BYPASS GRAFT LEFT FEMORAL- POSTERIOR TIBIAL ARTERY;  Surgeon: Angelia Mould, MD;  Location: [place];  Service: Vascular;  Laterality: Left;   FEMORAL-TIBIAL BYPASS GRAFT Right 09/10/2017   Procedure: RIGHT SUPERFICIAL  FEMORAL ARTERY-BELOW KNEE POPLITEAL ARTERY BYPASS GRAFT WITH VEIN;  Surgeon: Angelia Mould, MD;  Location: [place];  Service: Vascular;  Laterality: Right;   GIVENS CAPSULE STUDY N/A 07/20/2020   Procedure: GIVENS CAPSULE STUDY;  Surgeon: Irving Copas., MD;  Location: [place];  Service: Gastroenterology;  Laterality: N/A;  capsule placed through scope into duodenum @ 0936   I & D EXTREMITY Left 10/27/2013   Procedure: IRRIGATION AND DEBRIDEMENT EXTREMITY- LEFT FOOT;  Surgeon: Angelia Mould, MD;  Location: [place];  Service: Vascular;  Laterality: Left;   I & D EXTREMITY Right 01/26/2020   Procedure: IRRIGATION AND DEBRIDEMENT EXTREMITY RIGHT FOOT WOUND;  Surgeon: Angelia Mould, MD;  Location: [place];  Service: Vascular;  Laterality: Right;   INSERT / REPLACE / REMOVE PACEMAKER     INSERTION OF DIALYSIS CATHETER Right 11/03/2021   Procedure: RIGHT INTERNAL JUGULAR INSERTION OF TUNNELED DIALYSIS CATHETER;  Surgeon: Angelia Mould, MD;  Location: [place];  Service: Vascular;  Laterality: Right;   INTRAOPERATIVE ARTERIOGRAM Right 09/10/2017   Procedure: INTRA OPERATIVE ARTERIOGRAM;  Surgeon: Angelia Mould, MD;  Location: [place];  Service: Vascular;  Laterality: Right;   IR ANGIOGRAM FOLLOW UP STUDY  09/09/2017   IR FLUORO GUIDE CV LINE RIGHT  05/31/2020   IR LUMBAR DISC ASPIRATION W/IMG GUIDE  01/19/2022   IR US GUIDE VASC ACCESS RIGHT  06/01/2020   LEFT HEART CATH AND CORONARY ANGIOGRAPHY N/A 08/19/2020   Procedure: LEFT HEART CATH AND CORONARY ANGIOGRAPHY;  Surgeon: Adrian Prows, MD;  Location: [place] [facility];  Service: Cardiovascular;  Laterality: N/A;   LEFT HEART CATHETERIZATION WITH CORONARY ANGIOGRAM N/A 11/09/2014   Procedure: LEFT HEART CATHETERIZATION WITH CORONARY ANGIOGRAM;  Surgeon: Laverda Page, MD;  Location: [facility];  Service: Cardiovascular;  Laterality: N/A;   LOWER EXTREMITY ANGIOGRAM Right 11/08/2015   Procedure: Lower Extremity Angiogram;  Surgeon: Serafina Mitchell, MD;  Location: [place] [facility];   Service: Cardiovascular;  Laterality: Right;   LUMBAR LAMINECTOMY  06/13/2012   Procedure: MICRODISCECTOMY LUMBAR LAMINECTOMY;  Surgeon: Jessy Oto, MD;  Location: [place];  Service: Orthopedics;  Laterality: N/A;  Central laminectomy L2-3, L3-4, L4-5   PACEMAKER INSERTION  10/08/2014   MDT Advisa MRI compatible dual chamber pacemaker implanted by Dr Caryl Comes for syncope with post-termination pauses   PERCUTANEOUS CORONARY STENT INTERVENTION (PCI-S)  11/09/2014   des to lad & distal circumflex         Dr  [name]   PERIPHERAL VASCULAR BALLOON ANGIOPLASTY Right 11/11/2019   Procedure: PERIPHERAL VASCULAR BALLOON ANGIOPLASTY;  Surgeon: Marty Heck, MD;  Location: [place] [facility];  Service: Cardiovascular;  Laterality: Right;  below knee popliteal, tibioperoneal trunk, posterior tibial arteries   PERIPHERAL VASCULAR CATHETERIZATION N/A 11/08/2015   Procedure: Abdominal Aortogram;  Surgeon: Serafina Mitchell, MD;  Location: [place] [facility];  Service: Cardiovascular;  Laterality: N/A;   PERIPHERAL VASCULAR CATHETERIZATION Right 11/08/2015   Procedure: Peripheral Vascular Atherectomy;  Surgeon: Serafina Mitchell, MD;  Location: [place] [facility];  Service: Cardiovascular;  Laterality: Right;   PERIPHERAL VASCULAR CATHETERIZATION N/A 10/10/2016   Procedure: Lower Extremity Angiography;  Surgeon: Waynetta Sandy, MD;  Location: [place] [facility];  Service: Cardiovascular;  Laterality: N/A;   PERIPHERAL VASCULAR CATHETERIZATION Right 10/10/2016   Procedure: Peripheral Vascular Atherectomy;  Surgeon: Waynetta Sandy, MD;  Location: [place] [facility];  Service: Cardiovascular;  Laterality: Right;  Popliteal   PERMANENT PACEMAKER INSERTION N/A 10/08/2014   Procedure: PERMANENT PACEMAKER INSERTION;  Surgeon: Deboraha Sprang, MD;  Location: [facility] LAB;  Service: Cardiovascular;  Laterality: N/A;   RADIOLOGY WITH ANESTHESIA N/A 01/19/2022   Procedure: Disc Aspiration;  Surgeon: Pedro Earls, MD;  Location: [place];  Service: Radiology;  Laterality: N/A;   SUBMUCOSAL TATTOO INJECTION  07/20/2020   Procedure: SUBMUCOSAL TATTOO INJECTION;  Surgeon: Irving Copas., MD;  Location: [place];  Service: Gastroenterology;;   TEE WITHOUT CARDIOVERSION N/A 10/27/2021   Procedure: TRANSESOPHAGEAL ECHOCARDIOGRAM (TEE);  Surgeon: Adrian Prows, MD;  Location: [facility] ENDOSCOPY;  Service: Cardiovascular;  Laterality: N/A;   TEMPORARY PACEMAKER INSERTION Bilateral 10/07/2014   Procedure: TEMPORARY PACEMAKER INSERTION;  Surgeon: Laverda Page, MD;  Location: [facility];  Service: Cardiovascular;  Laterality: Bilateral;   WOUND DEBRIDEMENT Right 02/19/2020   Procedure: DEBRIDEMENT WOUND RIGHT FOOT;  Surgeon: Angelia Mould, MD;  Location: [facility] OR;  Service: Vascular;  Laterality: Right;    Family History  Problem Relation Age of Onset   Diabetes type II Mother    Hypertension Mother    Diabetes Mother    Liver cancer Father    Diabetes type II Sister    Breast cancer Sister  Diabetes Sister    Hypertension Sister    Diabetes type II Brother    Kidney failure Brother    Diabetes Brother    Hypertension Brother    Diabetes type II Sister    Social History:  reports that he has been smoking cigars. He has been exposed to tobacco smoke. He has never used smokeless tobacco. He reports that he does not currently use alcohol after a past usage of about 21.0 standard drinks of alcohol per week. He reports that he does not use drugs.  Allergies:  Allergies  Allergen Reactions   Cytoxan [Cyclophosphamide] Other (See Comments)    Pancytopenia   Pletal [Cilostazol] Palpitations and Other (See Comments)    "Can hear heart beating loudly". Other reaction(s): Unknown   Keflex [Cephalexin] Nausea And Vomiting    Other reaction(s): Unknown   Tylenol [Acetaminophen] Other (See Comments)    Patient has a cold sweat   Iron Sucrose     Other reaction(s):  Unknown   Sulfa Antibiotics Rash    Other reaction(s): Unknown    No medications prior to admission.    No results found for this or any previous visit (from the past 48 hour(s)). No results found.  Review of Systems  All other systems reviewed and are negative.   There were no vitals taken for this visit. Physical Exam  Patient is alert, oriented, no adenopathy, well-dressed, normal affect, normal respiratory effort. Examination patient has a purulent draining abscess of the left heel.  There is necrotic ulcers in the calf with drainage.  The more proximal calf ulcers have healed. Assessment/Plan 1. Unilateral AKA, right ([place])   2. S/P transmetatarsal amputation of foot, left ([place])   3. Gangrene of left foot ([place])       Plan: With the progressive gangrenous changes and abscess of the left heel and necrotic ulcers of the calf I have recommended proceeding with a transtibial amputation.  Discussed that there is risk of this progressing to an above-the-knee amputation.  Patient states he understands wished to proceed at this time he is currently on antibiotics.  Newt Minion, MD 05/11/2022, 6:37 AM

## 2022-05-11 NOTE — Interval H&P Note (Signed)
History and Physical Interval Note:  05/11/2022 9:05 AM  Alexander [name]  has presented today for surgery, with the diagnosis of Abscess Osteomyelitis Left Foot.  The various methods of treatment have been discussed with the patient and family. After consideration of risks, benefits and other options for treatment, the patient has consented to  Procedure(s): LEFT BELOW KNEE AMPUTATION (Left) as a surgical intervention.  The patient's history has been reviewed, patient examined, no change in status, stable for surgery.  I have reviewed the patient's chart and labs.  Questions were answered to the patient's satisfaction.     Newt Minion

## 2022-05-11 NOTE — Op Note (Signed)
05/11/2022  9:51 AM  PATIENT:  Alexander [name]    PRE-OPERATIVE DIAGNOSIS:  Abscess Osteomyelitis Left Foot  POST-OPERATIVE DIAGNOSIS:  Same  PROCEDURE:  LEFT BELOW KNEE AMPUTATION Application kerecis 38 sq cm Application vac  SURGEON:  Newt Minion, MD  ANESTHESIA:   General  PREOPERATIVE INDICATIONS:  Alexander [name] is a  61 y.o. male with a diagnosis of Abscess Osteomyelitis Left Foot who failed conservative measures and elected for surgical management.    The risks benefits and alternatives were discussed with the patient preoperatively including but not limited to the risks of infection, bleeding, nerve injury, cardiopulmonary complications, the need for revision surgery, among others, and the patient was willing to proceed.  OPERATIVE IMPLANTS: kerecis 38 sq cm   OPERATIVE FINDINGS: ischemic grey muscle  OPERATIVE PROCEDURE: Patient was brought to the operating room after undergoing a regional anesthetic.  After adequate levels anesthesia were obtained a thigh tourniquet was placed and the lower extremity was prepped using DuraPrep draped into a sterile field. The foot was draped out of the sterile field with impervious stockinette.  A timeout was called and the tourniquet inflated.  A transverse skin incision was made 12 cm distal to the tibial tubercle, the incision curved proximally, and a large posterior flap was created.  The tibia was transected just proximal to the skin incision and beveled anteriorly.  The fibula was transected just proximal to the tibial incision.  The sciatic nerve was pulled cut and allowed to retract.  The vascular bundles were suture ligated with 2-0 silk.  The tourniquet was deflated and hemostasis obtained.  The deep and superficial fascial layers were closed using #1 Vicryl.  The skin was closed using staples.  The Prevena customizable dressing was applied this was overwrapped with the arthroform sponge.  Alexander [name] was used to secure the sponges and the  circumferential compression was secured to the skin with Dermatac.  This was connected to the wound VAC pump and had a good suction fit this was covered with a stump shrinker and a limb protector.  Patient was taken to the PACU in stable condition.   DISCHARGE PLANNING:  Antibiotic duration: 24-hour antibiotics  Weightbearing: Nonweightbearing on the operative extremity  Pain medication: Opioid pathway  Dressing care/ Wound VAC: Continue wound VAC with the Prevena plus pump at discharge for 1 week  Ambulatory devices: Walker or kneeling scooter  Discharge to: Discharge planning based on recommendations per physical therapy  Follow-up: In the office 1 week after discharge.

## 2022-05-11 NOTE — Progress Notes (Signed)
Pt receives out-pt HD at [facility] on MWF. Pt arrives at 10:30 for 10:50 chair time. Will assist as needed.   Melven Sartorius Renal Navigator [phone]

## 2022-05-11 NOTE — Progress Notes (Signed)
Orthopedic Tech Progress Note Patient Details:  Alexander [name] February 22, 1961 [id number]  Patient ID: [name], male   DOB: 05-19-1961, 61 y.o.   MRN: [id number] Order placed with [facility].  [place] 05/11/2022, 10:02 AM

## 2022-05-11 NOTE — Transfer of Care (Signed)
Immediate Anesthesia Transfer of Care Note  Patient: Alexander [name]  Procedure(s) Performed: LEFT BELOW KNEE AMPUTATION (Left: Knee)  Patient Location: PACU  Anesthesia Type:General  Level of Consciousness: drowsy and patient cooperative  Airway & Oxygen Therapy: Patient Spontanous Breathing and Patient connected to face mask oxygen  Post-op Assessment: Report given to RN and Post -op Vital signs reviewed and stable  Post vital signs: Reviewed and stable  Last Vitals:  Vitals Value Taken Time  BP 112/80 05/11/22 1001  Temp    Pulse 85 05/11/22 1003  Resp 13 05/11/22 1005  SpO2 100 % 05/11/22 1003  Vitals shown include unvalidated device data.  Last Pain:  Vitals:   05/11/22 0815  TempSrc:   PainSc: 6          Complications: No notable events documented.

## 2022-05-11 NOTE — Anesthesia Procedure Notes (Signed)
Procedure Name: Intubation Date/Time: 05/11/2022 9:19 AM  Performed by: Gwyndolyn Saxon, CRNAPre-anesthesia Checklist: Patient identified, Emergency Drugs available, Suction available and Patient being monitored Patient Re-evaluated:Patient Re-evaluated prior to induction Oxygen Delivery Method: Circle system utilized Preoxygenation: Pre-oxygenation with 100% oxygen Induction Type: IV induction Ventilation: Mask ventilation without difficulty Laryngoscope Size: Mac and 4 Grade View: Grade I Tube type: Oral Tube size: 7.5 mm Number of attempts: 1 Airway Equipment and Method: Stylet Placement Confirmation: ETT inserted through vocal cords under direct vision, positive ETCO2 and breath sounds checked- equal and bilateral Secured at: 21 cm Tube secured with: Tape Dental Injury: Teeth and Oropharynx as per pre-operative assessment

## 2022-05-12 LAB — RENAL FUNCTION PANEL
Albumin: 2.3 g/dL — ABNORMAL LOW (ref 3.5–5.0)
Anion gap: 16 — ABNORMAL HIGH (ref 5–15)
BUN: 17 mg/dL (ref 8–23)
CO2: 28 mmol/L (ref 22–32)
Calcium: 8.2 mg/dL — ABNORMAL LOW (ref 8.9–10.3)
Chloride: 92 mmol/L — ABNORMAL LOW (ref 98–111)
Creatinine, Ser: 3.38 mg/dL — ABNORMAL HIGH (ref 0.61–1.24)
GFR, Estimated: 20 mL/min — ABNORMAL LOW (ref >60)
Glucose, Bld: 174 mg/dL — ABNORMAL HIGH (ref 70–99)
Phosphorus: 4.6 mg/dL (ref 2.5–4.6)
Potassium: 3.4 mmol/L — ABNORMAL LOW (ref 3.5–5.1)
Sodium: 136 mmol/L (ref 135–145)

## 2022-05-12 LAB — CBC
HCT: 28.6 % — ABNORMAL LOW (ref 39.0–52.0)
Hemoglobin: 9 g/dL — ABNORMAL LOW (ref 13.0–17.0)
MCH: 28.2 pg (ref 26.0–34.0)
MCHC: 31.5 g/dL (ref 30.0–36.0)
MCV: 89.7 fL (ref 80.0–100.0)
Platelets: 117 10*3/uL — ABNORMAL LOW (ref 150–400)
RBC: 3.19 MIL/uL — ABNORMAL LOW (ref 4.22–5.81)
RDW: 16.7 % — ABNORMAL HIGH (ref 11.5–15.5)
WBC: 8.6 10*3/uL (ref 4.0–10.5)
nRBC: 0 % (ref 0.0–0.2)

## 2022-05-12 LAB — GLUCOSE, CAPILLARY
Glucose-Capillary: 191 mg/dL — ABNORMAL HIGH (ref 70–99)
Glucose-Capillary: 221 mg/dL — ABNORMAL HIGH (ref 70–99)
Glucose-Capillary: 95 mg/dL (ref 70–99)

## 2022-05-12 LAB — HEPATITIS B SURFACE ANTIBODY, QUANTITATIVE: Hep B S AB Quant (Post): 12.1 m[IU]/mL (ref >9.9)

## 2022-05-12 MED ORDER — DOXYCYCLINE HYCLATE 100 MG PO TABS
100.0000 mg | ORAL_TABLET | Freq: Two times a day (BID) | ORAL | Status: DC
  Administered 2022-05-12 – 2022-05-15 (×5): 100 mg via ORAL
  Filled 2022-05-12 (×5): qty 1

## 2022-05-12 MED ORDER — AMOXICILLIN 500 MG PO CAPS
500.0000 mg | ORAL_CAPSULE | Freq: Every day | ORAL | Status: DC
  Administered 2022-05-12 – 2022-05-14 (×3): 500 mg via ORAL
  Filled 2022-05-12 (×3): qty 1

## 2022-05-12 NOTE — Evaluation (Signed)
Physical Therapy Evaluation Patient Details Name: Alexander [name] MRN: [id number] DOB: [birth year] Today's Date: 05/12/2022  History of Present Illness  Pt is a 61yo M s/p L BKA due to gangrene on 6/30. PMH: R AKA (01/31/22), afib, CAD, DM, pacemaker, GERD, HTN  Clinical Impression  Prior to admission, pt was requiring assistance for all ADLs/IADLs and doing slide board transfers to and from the wheelchair with assistance. Pt currently requiring modA+2 for bed mobility. Daughters present and able to explain how they previously did transfers with therapists offering suggestions for improved safety with new amputation. Pt and family confident they can provide necessary assistance for pt at home. Pt would benefit from acute PT to address transfer quality and ensure patient ability to return home safely. Recommending HHPT upon discharge to maximize functional independence.      Recommendations for follow up therapy are one component of a multi-disciplinary discharge planning process, led by the attending physician.  Recommendations may be updated based on patient status, additional functional criteria and insurance authorization.  Follow Up Recommendations Home health PT      Assistance Recommended at Discharge Intermittent Supervision/Assistance  Patient can return home with the following  A lot of help with walking and/or transfers;A lot of help with bathing/dressing/bathroom;Assistance with cooking/housework;Assist for transportation;Help with stairs or ramp for entrance    Equipment Recommendations None recommended by PT  Recommendations for Other Services       Functional Status Assessment Patient has had a recent decline in their functional status and demonstrates the ability to make significant improvements in function in a reasonable and predictable amount of time.     Precautions / Restrictions Precautions Precautions: Fall Precaution Comments: educated on limb positioning Required  Braces or Orthoses: Other Brace Other Brace: L limb protector Restrictions Weight Bearing Restrictions: Yes RLE Weight Bearing: Non weight bearing LLE Weight Bearing: Non weight bearing      Mobility  Bed Mobility Overal bed mobility: Needs Assistance Bed Mobility: Supine to Sit, Sit to Supine     Supine to sit: Mod assist, +2 for physical assistance Sit to supine: Min assist   General bed mobility comments: requiring trunk assist and assist to advance LLE to sit EOB. LLE assist to return to supine    Transfers                        Ambulation/Gait                  Stairs            Wheelchair Mobility    Modified Rankin (Stroke Patients Only)       Balance Overall balance assessment: Needs assistance Sitting-balance support: Bilateral upper extremity supported Sitting balance-Leahy Scale: Good Sitting balance - Comments: able to sit EOB with BUE support for approximately 10 minutes. Able to scoot laterally to both sides with min guard                                     Pertinent Vitals/Pain Pain Assessment Pain Assessment: Faces Faces Pain Scale: Hurts a little bit Pain Location: L residual limb Pain Descriptors / Indicators: Sore Pain Intervention(s): Limited activity within patient's tolerance, Monitored during session    Home Living Family/patient expects to be discharged to:: Private residence Living Arrangements: Spouse/significant other;Children Available Help at Discharge: Family Type of Home: House Home Access: Ramped entrance  Home Layout: One level Home Equipment: Wheelchair - IT trainer (2 wheels);Hospital bed (slide board) Additional Comments: Daughter assists with transfers and is moving out soon but will still be close by, wife and daughter will be available to assist upon discharge    Prior Function Prior Level of Function : Needs assist       Physical Assist : Mobility  (physical);ADLs (physical) Mobility (physical): Bed mobility;Transfers ADLs (physical): Bathing;Dressing;IADLs Mobility Comments: Slide board transfers to and from [facility], transport in wheelchair accesible Lucianne Lei to HD       Hand Dominance   Dominant Hand: Right    Extremity/Trunk Assessment   Upper Extremity Assessment Upper Extremity Assessment: Overall WFL for tasks assessed    Lower Extremity Assessment Lower Extremity Assessment: LLE deficits/detail LLE Deficits / Details: expected post op deficits, able to perform quad sets    Cervical / Trunk Assessment Cervical / Trunk Assessment: Normal  Communication   Communication: No difficulties  Cognition Arousal/Alertness: Awake/alert Behavior During Therapy: WFL for tasks assessed/performed Overall Cognitive Status: Within Functional Limits for tasks assessed                                          General Comments      Exercises Amputee Exercises Quad Sets: AROM, Left, 5 reps, Supine   Assessment/Plan    PT Assessment Patient needs continued PT services  PT Problem List Decreased strength;Decreased range of motion;Decreased activity tolerance;Decreased balance;Decreased mobility;Decreased knowledge of use of DME       PT Treatment Interventions DME instruction;Functional mobility training;Therapeutic activities;Therapeutic exercise;Balance training;Patient/family education;Wheelchair mobility training;Manual techniques    PT Goals (Current goals can be found in the Care Plan section)  Acute Rehab PT Goals Patient Stated Goal: to go home PT Goal Formulation: With patient Time For Goal Achievement: 05/26/22 Potential to Achieve Goals: Good    Frequency Min 3X/week     Co-evaluation               AM-PAC PT "6 Clicks" Mobility  Outcome Measure Help needed turning from your back to your side while in a flat bed without using bedrails?: A Lot Help needed moving from lying on your back to  sitting on the side of a flat bed without using bedrails?: A Lot Help needed moving to and from a bed to a chair (including a wheelchair)?: Total Help needed standing up from a chair using your arms (e.g., wheelchair or bedside chair)?: Total Help needed to walk in hospital room?: Total Help needed climbing 3-5 steps with a railing? : Total 6 Click Score: 8    End of Session   Activity Tolerance: Patient tolerated treatment well Patient left: in bed;with call bell/phone within reach;with nursing/sitter in room;with family/visitor present Nurse Communication: Mobility status PT Visit Diagnosis: Other abnormalities of gait and mobility (R26.89);Muscle weakness (generalized) (M62.81)    Time: 8502-7741 PT Time Calculation (min) (ACUTE ONLY): 33 min   Charges:   PT Evaluation $PT Eval Moderate Complexity: 1 Mod PT Treatments $Therapeutic Activity: 8-22 mins        Mackie Pai, SPT Acute Rehabilitation Services  Office: [phone]   Mackie Pai 05/12/2022, 5:04 PM

## 2022-05-12 NOTE — Progress Notes (Signed)
Received patient in bed, alert and oriented. Informed consent signed and in chart.  Time tx completed:1345  HD treatment completed. Patient tolerated well. Fistula/Graft/HD catheter without signs and symptoms of complications. Patient transported back to the room, alert and orient and in no acute distress. Report given to bedside RN.  Total UF removed:3.5L  Medication given:Ferric gluconate 125 mg IV  Post HD VS:104/70  Post HD weight: 80kg

## 2022-05-12 NOTE — Progress Notes (Signed)
Patient ID: Alexander [name], male   DOB: 10-Jun-1961, 61 y.o.   MRN: [id number] Patient is postoperative day 1 left transtibial amputation.  There is no drainage in the wound VAC canister.  Patient's muscles were gray and ischemic.  Discussed that this has about a 50% chance of healing and that he may require an above-the-knee amputation on the left similar to the right.  Plan for discharge to skilled nursing.

## 2022-05-12 NOTE — Progress Notes (Signed)
[facility] Progress Note   Subjective:   Seen on HD. Alert and tolerating well. Denies SOB, CP, dizziness, abdominal pain and nausea.   Objective Vitals:   05/12/22 0855 05/12/22 0900 05/12/22 0920 05/12/22 0925  BP:  116/78 111/67 103/76  Pulse:  (!) 104 90   Resp:  (!) 23 20   Temp:  98.5 F (36.9 C)    TempSrc:      SpO2:  96% 92%   Weight: 88.8 kg      Physical Exam General: Alert male in NAD Heart: RRR, no murmurs, rubs or gallops Lungs: CTA bilaterally without wheezing, rhonchi or rales Abdomen: Soft, non-distended, +BS Extremities: L leg in cast, no edema R AKA Dialysis Access: RUE AVF accessed  Additional Objective Labs: Basic Metabolic Panel: Recent Labs  Lab 05/11/22 0840 05/12/22 0141  NA 132* 136  K 3.3* 3.4*  CL 92* 92*  CO2  --  28  GLUCOSE 135* 174*  BUN 17 17  CREATININE 3.00* 3.38*  CALCIUM  --  8.2*  PHOS  --  4.6   Liver Function Tests: Recent Labs  Lab 05/12/22 0141  ALBUMIN 2.3*   No results for input(s): "LIPASE", "AMYLASE" in the last 168 hours. CBC: Recent Labs  Lab 05/11/22 0840 05/12/22 0141  WBC  --  8.6  HGB 11.9* 9.0*  HCT 35.0* 28.6*  MCV  --  89.7  PLT  --  117*   Blood Culture    Component Value Date/Time   SDES WOUND 01/19/2022 1452   SPECREQUEST L3/L4 DISC ASPIRATION 01/19/2022 1452   CULT  01/19/2022 1452    No growth aerobically or anaerobically. Performed at [facility], [place] [address], [place], [place] [address]    REPTSTATUS 01/24/2022 FINAL 01/19/2022 1452    Cardiac Enzymes: No results for input(s): "CKTOTAL", "CKMB", "CKMBINDEX", "TROPONINI" in the last 168 hours. CBG: Recent Labs  Lab 05/11/22 0751 05/11/22 1003 05/11/22 1555 05/11/22 2015 05/12/22 0810  GLUCAP 131* 138* 194* 181* 191*   Iron Studies: No results for input(s): "IRON", "TIBC", "TRANSFERRIN", "FERRITIN" in the last 72 hours. '@lablastinr3'$ @ Studies/Results: DG CHEST PORT 1 VIEW  Result Date:  05/11/2022 CLINICAL DATA:  Hypoxemia. EXAM: PORTABLE CHEST 1 VIEW COMPARISON:  Chest radiograph 11/03/2021. FINDINGS: Ill-defined opacities at the lateral right lung base. Probable small right pleural effusion. Left lung is clear. Similar mild enlargement the cardiac silhouette. Left subclavian approach cardiac rhythm maintenance device. No evidence of acute osseous abnormality. IMPRESSION: Probable small right pleural effusion with overlying right basilar opacities that could represent atelectasis and/or pneumonia. Electronically Signed   By: Margaretha Sheffield M.D.   On: 05/11/2022 13:46   Medications:  sodium chloride Stopped (05/11/22 1327)   ferric gluconate (FERRLECIT) IVPB     [START ON 05/14/2022] ferric gluconate (FERRLECIT) IVPB     magnesium sulfate bolus IVPB      apixaban  5 mg Oral BID   vitamin C  1,000 mg Oral Daily   atorvastatin  20 mg Oral QHS   Chlorhexidine Gluconate Cloth  6 each Topical Q0600   docusate sodium  100 mg Oral Daily   furosemide  80 mg Oral Once per day on Sun Tue Thu Sat   gabapentin  300 mg Oral TID   insulin aspart  0-9 Units Subcutaneous TID WC   insulin aspart  3 Units Subcutaneous TID WC   metoprolol tartrate  25 mg Oral BID   nutrition supplement (JUVEN)  1 packet Oral  BID BM   pantoprazole  40 mg Oral Daily   zinc sulfate  220 mg Oral Daily    Dialysis Orders: MWF GOC 4h 71mn  400/800  85kg  2/2.5 bath  RUA AVF  heparin none - last HD 6/28, good compliance - post wts 85- 86kg, except last HD 87kg - soft BP's in general - mircera 225 q2, last 6/21 - iron sucrose '100mg'$  q hd thru 7/10 - last Hb 8.9 on 6/28, tsat 17%, ferr 1030  Assessment/Plan: SP L BKA - 6/30 by Dr DSharol Givenfor gangrenous/ non-healing wounds, management per ortho Atrial fib - takes metoprolol 25 bid and eliquis, rate currently controlled ESRD - on HD MWF. HD postponed to today due to his surgery, next HD will be Monday HTN/ vol - left over EDW last HD. CXR shorted small R  pleural effusion and possibly opacity. Tolerating high UF goal today, reestablishing EDW post op Anemia of CKD - Hb 9.0 (declined post op), last OP esa was 6/21, next due is 7/05. Will continue '100mg'$  w/ each HD thru 7/10.  MBD ckd  - calcium and phosphorus controlled. Not on VDRA. No binder on outpatient med list, continue to monitor phosphorus trend Nutrition - Renal diet on fluid restriction. Albumin is low. On protein supps, renal vitamins  DMT2-per primary PPM infection: hx of suspected vegetation on valve leaflet, he is on chronic prophylactic indefinitely (amoxicillin 500 qhs). Follows with cardiology.   SAnice Paganini PA-C 05/12/2022, 10:09 AM  [facility] Pager: [phone]

## 2022-05-12 NOTE — Progress Notes (Signed)
PT Cancellation Note  Patient Details Name: Alexander [name] MRN: [id number] DOB: [birth year]   Cancelled Treatment:    Reason Eval/Treat Not Completed: Patient at procedure or test/unavailable (HD)  [name], PT, DPT Acute Rehabilitation Services Office [place] 05/12/2022, 9:10 AM

## 2022-05-12 NOTE — Progress Notes (Signed)
OT Cancellation Note  Patient Details Name: Alexander [name] MRN: [id number] DOB: [birth year]   Cancelled Treatment:    Reason Eval/Treat Not Completed: Patient at procedure or test/ unavailable.  OT to continue efforts as appropriate.    Asiah Browder D Elyse Prevo 05/12/2022, 9:08 AM

## 2022-05-13 LAB — RENAL FUNCTION PANEL
Albumin: 2.2 g/dL — ABNORMAL LOW (ref 3.5–5.0)
Anion gap: 9 (ref 5–15)
BUN: 13 mg/dL (ref 8–23)
CO2: 29 mmol/L (ref 22–32)
Calcium: 8 mg/dL — ABNORMAL LOW (ref 8.9–10.3)
Chloride: 96 mmol/L — ABNORMAL LOW (ref 98–111)
Creatinine, Ser: 2.6 mg/dL — ABNORMAL HIGH (ref 0.61–1.24)
GFR, Estimated: 27 mL/min — ABNORMAL LOW (ref >60)
Glucose, Bld: 105 mg/dL — ABNORMAL HIGH (ref 70–99)
Phosphorus: 2.7 mg/dL (ref 2.5–4.6)
Potassium: 3.2 mmol/L — ABNORMAL LOW (ref 3.5–5.1)
Sodium: 134 mmol/L — ABNORMAL LOW (ref 135–145)

## 2022-05-13 LAB — CBC
HCT: 25.6 % — ABNORMAL LOW (ref 39.0–52.0)
Hemoglobin: 7.6 g/dL — ABNORMAL LOW (ref 13.0–17.0)
MCH: 27.2 pg (ref 26.0–34.0)
MCHC: 29.7 g/dL — ABNORMAL LOW (ref 30.0–36.0)
MCV: 91.8 fL (ref 80.0–100.0)
Platelets: 126 10*3/uL — ABNORMAL LOW (ref 150–400)
RBC: 2.79 MIL/uL — ABNORMAL LOW (ref 4.22–5.81)
RDW: 17 % — ABNORMAL HIGH (ref 11.5–15.5)
WBC: 8.8 10*3/uL (ref 4.0–10.5)
nRBC: 0 % (ref 0.0–0.2)

## 2022-05-13 LAB — GLUCOSE, CAPILLARY
Glucose-Capillary: 161 mg/dL — ABNORMAL HIGH (ref 70–99)
Glucose-Capillary: 97 mg/dL (ref 70–99)
Glucose-Capillary: 141 mg/dL — ABNORMAL HIGH (ref 70–99)
Glucose-Capillary: 118 mg/dL — ABNORMAL HIGH (ref 70–99)

## 2022-05-13 MED ORDER — CHLORHEXIDINE GLUCONATE CLOTH 2 % EX PADS
6.0000 | MEDICATED_PAD | Freq: Every day | CUTANEOUS | Status: DC
  Administered 2022-05-13 – 2022-05-15 (×3): 6 via TOPICAL

## 2022-05-13 NOTE — Progress Notes (Signed)
[facility] Progress Note   Subjective:   Taking morning meds. Reports feeling well, no SOB, CP, dizziness or nausea. Reports no issues with HD yesterday.   Objective Vitals:   05/12/22 1419 05/12/22 1458 05/12/22 2031 05/13/22 0819  BP:  113/70 116/67 108/73  Pulse:  93 91 80  Resp:  17  17  Temp:  98.8 F (37.1 C) 97.6 F (36.4 C) 97.9 F (36.6 C)  TempSrc:  Oral Oral Oral  SpO2:  98% 98% 99%  Weight: 82.3 kg  88.8 kg   Height:   6' (1.829 m)    Physical Exam General: Alert male in NAD Heart: RRR, no murmurs, rubs or gallops Lungs: CTA bilaterally without wheezing, rhonchi or rales Abdomen: Soft, non-distended, +BS Extremities: L leg in cast, no edema R AKA Dialysis Access: RUE AVF + bruit    Additional Objective Labs: Basic Metabolic Panel: Recent Labs  Lab 05/11/22 0840 05/12/22 0141 05/13/22 0104  NA 132* 136 134*  K 3.3* 3.4* 3.2*  CL 92* 92* 96*  CO2  --  28 29  GLUCOSE 135* 174* 105*  BUN '17 17 13  '$ CREATININE 3.00* 3.38* 2.60*  CALCIUM  --  8.2* 8.0*  PHOS  --  4.6 2.7   Liver Function Tests: Recent Labs  Lab 05/12/22 0141 05/13/22 0104  ALBUMIN 2.3* 2.2*   No results for input(s): "LIPASE", "AMYLASE" in the last 168 hours. CBC: Recent Labs  Lab 05/11/22 0840 05/12/22 0141 05/13/22 0104  WBC  --  8.6 8.8  HGB 11.9* 9.0* 7.6*  HCT 35.0* 28.6* 25.6*  MCV  --  89.7 91.8  PLT  --  117* 126*   Blood Culture    Component Value Date/Time   SDES WOUND 01/19/2022 1452   SPECREQUEST L3/L4 DISC ASPIRATION 01/19/2022 1452   CULT  01/19/2022 1452    No growth aerobically or anaerobically. Performed at [facility], [place] [address], [place], [place] [address]    REPTSTATUS 01/24/2022 FINAL 01/19/2022 1452    Cardiac Enzymes: No results for input(s): "CKTOTAL", "CKMB", "CKMBINDEX", "TROPONINI" in the last 168 hours. CBG: Recent Labs  Lab 05/11/22 2015 05/12/22 0810 05/12/22 1633 05/12/22 2130 05/13/22 0820  GLUCAP  181* 191* 221* 95 161*   Iron Studies: No results for input(s): "IRON", "TIBC", "TRANSFERRIN", "FERRITIN" in the last 72 hours. '@lablastinr3'$ @ Studies/Results: DG CHEST PORT 1 VIEW  Result Date: 05/11/2022 CLINICAL DATA:  Hypoxemia. EXAM: PORTABLE CHEST 1 VIEW COMPARISON:  Chest radiograph 11/03/2021. FINDINGS: Ill-defined opacities at the lateral right lung base. Probable small right pleural effusion. Left lung is clear. Similar mild enlargement the cardiac silhouette. Left subclavian approach cardiac rhythm maintenance device. No evidence of acute osseous abnormality. IMPRESSION: Probable small right pleural effusion with overlying right basilar opacities that could represent atelectasis and/or pneumonia. Electronically Signed   By: Margaretha Sheffield M.D.   On: 05/11/2022 13:46   Medications:  sodium chloride Stopped (05/11/22 1327)   [START ON 05/14/2022] ferric gluconate (FERRLECIT) IVPB     magnesium sulfate bolus IVPB      amoxicillin  500 mg Oral QHS   apixaban  5 mg Oral BID   vitamin C  1,000 mg Oral Daily   atorvastatin  20 mg Oral QHS   Chlorhexidine Gluconate Cloth  6 each Topical Q0600   docusate sodium  100 mg Oral Daily   doxycycline  100 mg Oral BID   furosemide  80 mg Oral Once per day on Sun Tue  Thu Sat   gabapentin  300 mg Oral TID   insulin aspart  0-9 Units Subcutaneous TID WC   insulin aspart  3 Units Subcutaneous TID WC   metoprolol tartrate  25 mg Oral BID   nutrition supplement (JUVEN)  1 packet Oral BID BM   pantoprazole  40 mg Oral Daily   zinc sulfate  220 mg Oral Daily    Dialysis Orders: MWF GOC 4h 14mn  400/800  85kg  2/2.5 bath  RUA AVF  heparin none - last HD 6/28, good compliance - post wts 85- 86kg, except last HD 87kg - soft BP's in general - mircera 225 q2, last 6/21 - iron sucrose '100mg'$  q hd thru 7/10 - last Hb 8.9 on 6/28, tsat 17%, ferr 1030  Assessment/Plan: SP L BKA - 6/30 by Dr DSharol Givenfor gangrenous/ non-healing wounds, management per  ortho Atrial fib - takes metoprolol 25 bid and eliquis, rate currently controlled ESRD - on HD MWF. HD postponed to Saturday due to his surgery, next HD will be Monday HTN/ vol - left over EDW last HD. CXR shorted small R pleural effusion and possibly opacity. Tolerating 3.5L UF yesterday, reestablishing EDW post op Anemia of CKD - Hb 7.6 (declined post op), last OP esa was 6/21, next due is 7/05. Also receiving course of IV iron with HD MBD ckd  - calcium and phosphorus controlled. Not on VDRA. No binder on outpatient med list, continue to monitor phosphorus trend Nutrition - Renal diet on fluid restriction. Albumin is low. On protein supps, renal vitamins  DMT2-per primary PPM infection: hx of suspected vegetation on valve leaflet, he is on chronic prophylactic indefinitely (amoxicillin and doxycycline). Follows with cardiology.      SAnice Paganini PA-C 05/13/2022, 10:31 AM  [facility] Pager: [phone]

## 2022-05-13 NOTE — Plan of Care (Signed)
  Problem: Nutritional: Goal: Maintenance of adequate nutrition will improve Outcome: Progressing   Problem: Clinical Measurements: Goal: Respiratory complications will improve Outcome: Progressing   Problem: Activity: Goal: Risk for activity intolerance will decrease Outcome: Progressing   Problem: Nutrition: Goal: Adequate nutrition will be maintained Outcome: Progressing   Problem: Coping: Goal: Level of anxiety will decrease Outcome: Progressing

## 2022-05-13 NOTE — Progress Notes (Signed)
Patient stable Pain controlled Minimal wound VAC drainage Brace in place left leg status post BKA Placement in progress

## 2022-05-13 NOTE — Evaluation (Signed)
Occupational Therapy Evaluation Patient Details Name: Alexander [name] MRN: [id number] DOB: 1960/11/27 Today's Date: 05/13/2022   History of Present Illness Pt is a 61yo M s/p L BKA due to gangrene on 6/30. PMH: R AKA (01/31/22), afib, CAD, DM, pacemaker, GERD, HTN   Clinical Impression   [name] was evaluated s/p the above L BKA. At baseline he requires assist for all mobility and ADLs from his daughters. Upon evaluation pt had functional limitations due to generalized weakness, BLE NWB, decreased activity tolerance and poor balance. Overall he required mod A for bed mobility, max A for lateral scoot transfer and up to max A fo ADLs at bed level. Pt will benefit from continued OT acutely. Recommend home with  and 24/7 direct physical assist.     Recommendations for follow up therapy are one component of a multi-disciplinary discharge planning process, led by the attending physician.  Recommendations may be updated based on patient status, additional functional criteria and insurance authorization.   Follow Up Recommendations  Home health OT    Assistance Recommended at Discharge Frequent or constant Supervision/Assistance  Patient can return home with the following A lot of help with walking and/or transfers;A lot of help with bathing/dressing/bathroom;Assistance with cooking/housework;Assist for transportation;Help with stairs or ramp for entrance    Functional Status Assessment  Patient has had a recent decline in their functional status and demonstrates the ability to make significant improvements in function in a reasonable and predictable amount of time.  Equipment Recommendations  None recommended by OT    Recommendations for Other Services       Precautions / Restrictions Precautions Precautions: Fall Precaution Comments: educated on limb positioning; wound vac Required Braces or Orthoses: Other Brace Other Brace: L limb protector Restrictions Weight Bearing Restrictions:  Yes RLE Weight Bearing: Non weight bearing LLE Weight Bearing: Non weight bearing      Mobility Bed Mobility Overal bed mobility: Needs Assistance Bed Mobility: Supine to Sit     Supine to sit: Mod assist     General bed mobility comments: able to pull into long sitting with min A and use of bilat rails - mod A to get to EOB    Transfers Overall transfer level: Needs assistance Equipment used: 1 person hand held assist Transfers: Bed to chair/wheelchair/BSC            Lateral/Scoot Transfers: Max assist General transfer comment: significantly increased time      Balance Overall balance assessment: Needs assistance Sitting-balance support: Bilateral upper extremity supported Sitting balance-Leahy Scale: Fair                                     ADL either performed or assessed with clinical judgement   ADL Overall ADL's : Needs assistance/impaired Eating/Feeding: Independent;Sitting   Grooming: Set up;Sitting   Upper Body Bathing: Set up;Sitting   Lower Body Bathing: Maximal assistance;Bed level   Upper Body Dressing : Set up;Sitting   Lower Body Dressing: Maximal assistance;Bed level   Toilet Transfer: Maximal assistance Toilet Transfer Details (indicate cue type and reason): bed level on bed pan, baseline Toileting- Clothing Manipulation and Hygiene: Total assistance;Bed level Toileting - Clothing Manipulation Details (indicate cue type and reason): baseline     Functional mobility during ADLs: Maximal assistance General ADL Comments: lateral scoot transfer significantly increased time and max A     Vision Baseline Vision/History: 0 No visual deficits Vision Assessment?:  No apparent visual deficits     Perception     Praxis      Pertinent Vitals/Pain Pain Assessment Pain Assessment: Faces Faces Pain Scale: Hurts a little bit Pain Location: L residual limb Pain Descriptors / Indicators: Sore Pain Intervention(s): Limited  activity within patient's tolerance, Monitored during session     Hand Dominance Right   Extremity/Trunk Assessment Upper Extremity Assessment Upper Extremity Assessment: Generalized weakness   Lower Extremity Assessment Lower Extremity Assessment: Defer to PT evaluation   Cervical / Trunk Assessment Cervical / Trunk Assessment: Normal   Communication Communication Communication: No difficulties   Cognition Arousal/Alertness: Awake/alert Behavior During Therapy: WFL for tasks assessed/performed Overall Cognitive Status: Within Functional Limits for tasks assessed                                       General Comments  VSS on RA    Exercises     Shoulder Instructions      Home Living Family/patient expects to be discharged to:: Private residence Living Arrangements: Spouse/significant other;Children Available Help at Discharge: Family Type of Home: House Home Access: Ramped entrance     Home Layout: One level     Bathroom Shower/Tub: Sponge bathes at baseline         Home Equipment: Wheelchair - IT trainer (2 wheels);Hospital bed   Additional Comments: Daughter assists with transfers and is moving out soon but will still be close by, wife and daughter will be available to assist upon discharge      Prior Functioning/Environment Prior Level of Function : Needs assist       Physical Assist : Mobility (physical);ADLs (physical) Mobility (physical): Bed mobility;Transfers ADLs (physical): Bathing;Dressing;IADLs Mobility Comments: Slide board transfers to and from [facility], transport in wheelchair accesible Lucianne Lei to HD ADLs Comments: bird baths, toilets on bed pan, family assists with dressing.        OT Problem List: Decreased strength;Decreased range of motion;Decreased activity tolerance;Impaired balance (sitting and/or standing);Decreased cognition;Decreased safety awareness;Decreased knowledge of precautions      OT  Treatment/Interventions: Self-care/ADL training;Therapeutic exercise;DME and/or AE instruction;Balance training;Patient/family education    OT Goals(Current goals can be found in the care plan section) Acute Rehab OT Goals Patient Stated Goal: home OT Goal Formulation: With patient Time For Goal Achievement: 05/27/22 Potential to Achieve Goals: Good ADL Goals Additional ADL Goal #1: pt will transfer OOB with min A and DME as needed as a precursor to ADLs Additional ADL Goal #2: Pt will demonstrate increased activity tolerance to complete at least 10 minutes of unsupported sitting during functional activity with supervision A  OT Frequency: Min 2X/week    Co-evaluation              AM-PAC OT "6 Clicks" Daily Activity     Outcome Measure Help from another person eating meals?: None Help from another person taking care of personal grooming?: A Little Help from another person toileting, which includes using toliet, bedpan, or urinal?: A Lot Help from another person bathing (including washing, rinsing, drying)?: A Lot Help from another person to put on and taking off regular upper body clothing?: A Little Help from another person to put on and taking off regular lower body clothing?: A Lot 6 Click Score: 16   End of Session Nurse Communication: Mobility status  Activity Tolerance: Patient tolerated treatment well Patient left: in chair;with call bell/phone within reach  OT Visit Diagnosis: Other abnormalities of gait and mobility (R26.89);Muscle weakness (generalized) (M62.81)                Time: 8768-1157 OT Time Calculation (min): 41 min Charges:  OT General Charges $OT Visit: 1 Visit OT Evaluation $OT Eval Moderate Complexity: 1 Mod OT Treatments $Therapeutic Activity: 23-37 mins   Ricki Vanhandel A Zemira Zehring 05/13/2022, 10:18 AM

## 2022-05-13 NOTE — Progress Notes (Signed)
Inpatient Rehab Admissions Coordinator:  Consult received. Note PT and OT recommending [place] therapy. Pt does not appear to require the intensity of CIR. Will not pursue a IP rehab consult. TOC made aware.   Gayland Curry, [place], [place] Admissions Coordinator [phone]

## 2022-05-13 NOTE — Progress Notes (Signed)
Physical Therapy Treatment Patient Details Name: Alexander [name] MRN: [id number] DOB: [birth year] Today's Date: 05/13/2022   History of Present Illness Pt is a 61yo M s/p L BKA due to gangrene on 6/30. PMH: R AKA (01/31/22), afib, CAD, DM, pacemaker, GERD, HTN    PT Comments    Focus this session on slide boards transfers to ensure pt ability to get in and out of [facility] with assist from family. Pt requiring assistance to place the slide board and minA +2 to transfer from chair to bed. Pt demonstrated good ability to assist with BUEs to scoot. Pt motivated to return home and confident in family's ability to provide him with the assist levels he requires. Would continue to benefit from acute PT while inpatient to progress independence with transfers and improve strength and ROM. Recommending HHPT upon discharge to maximize functional independence.    Recommendations for follow up therapy are one component of a multi-disciplinary discharge planning process, led by the attending physician.  Recommendations may be updated based on patient status, additional functional criteria and insurance authorization.  Follow Up Recommendations  Home health PT (pt refusing SNF)     Assistance Recommended at Discharge Intermittent Supervision/Assistance  Patient can return home with the following A lot of help with walking and/or transfers;A lot of help with bathing/dressing/bathroom;Assistance with cooking/housework;Assist for transportation;Help with stairs or ramp for entrance   Equipment Recommendations  None recommended by PT    Recommendations for Other Services       Precautions / Restrictions Precautions Precautions: Fall Precaution Comments: educated on limb positioning; wound vac Required Braces or Orthoses: Other Brace Other Brace: L limb protector Restrictions Weight Bearing Restrictions: Yes RLE Weight Bearing: Non weight bearing LLE Weight Bearing: Non weight bearing     Mobility  Bed  Mobility Overal bed mobility: Needs Assistance Bed Mobility: Sit to Supine     Supine to sit: Min guard     General bed mobility comments: pt in chair upon arrival, able to go from sitting EOB to supine with increased time and min guard for safety    Transfers Overall transfer level: Needs assistance Equipment used: Sliding board Transfers: Bed to chair/wheelchair/BSC            Lateral/Scoot Transfers: Min assist, +2 physical assistance, With slide board General transfer comment: Assist to place slide board, able to use UE to help scoot but still requiring minA+2 to scoot to bed    Ambulation/Gait                   Stairs             Wheelchair Mobility    Modified Rankin (Stroke Patients Only)       Balance                                            Cognition Arousal/Alertness: Awake/alert Behavior During Therapy: WFL for tasks assessed/performed Overall Cognitive Status: Within Functional Limits for tasks assessed                                          Exercises      General Comments General comments (skin integrity, edema, etc.): VSS on RA      Pertinent Vitals/Pain Pain Assessment  Pain Assessment: Faces Faces Pain Scale: Hurts a little bit Pain Location: L residual limb Pain Descriptors / Indicators: Sore Pain Intervention(s): Limited activity within patient's tolerance, Monitored during session    Home Living Family/patient expects to be discharged to:: Private residence Living Arrangements: Spouse/significant other;Children Available Help at Discharge: Family Type of Home: House Home Access: Ramped entrance       Home Layout: One level Home Equipment: Wheelchair - IT trainer (2 wheels);Hospital bed Additional Comments: Daughter assists with transfers and is moving out soon but will still be close by, wife and daughter will be available to assist upon discharge     Prior Function            PT Goals (current goals can now be found in the care plan section) Acute Rehab PT Goals Patient Stated Goal: to go home PT Goal Formulation: With patient Time For Goal Achievement: 05/26/22 Potential to Achieve Goals: Good Progress towards PT goals: Progressing toward goals    Frequency    Min 3X/week      PT Plan Current plan remains appropriate    Co-evaluation              AM-PAC PT "6 Clicks" Mobility   Outcome Measure  Help needed turning from your back to your side while in a flat bed without using bedrails?: A Little Help needed moving from lying on your back to sitting on the side of a flat bed without using bedrails?: A Lot Help needed moving to and from a bed to a chair (including a wheelchair)?: A Lot Help needed standing up from a chair using your arms (e.g., wheelchair or bedside chair)?: Total Help needed to walk in hospital room?: Total Help needed climbing 3-5 steps with a railing? : Total 6 Click Score: 10    End of Session Equipment Utilized During Treatment: Gait belt Activity Tolerance: Patient tolerated treatment well Patient left: in bed;with call bell/phone within reach;with bed alarm set Nurse Communication: Mobility status PT Visit Diagnosis: Other abnormalities of gait and mobility (R26.89);Muscle weakness (generalized) (M62.81)     Time: 1308-6578 PT Time Calculation (min) (ACUTE ONLY): 19 min  Charges:  $Therapeutic Activity: 8-22 mins                     Mackie Pai, SPT Acute Rehabilitation Services  Office: [phone]    Mackie Pai 05/13/2022, 1:34 PM

## 2022-05-14 ENCOUNTER — Encounter (HOSPITAL_COMMUNITY): Payer: Self-pay | Admitting: Orthopedic Surgery

## 2022-05-14 LAB — GLUCOSE, CAPILLARY
Glucose-Capillary: 170 mg/dL — ABNORMAL HIGH (ref 70–99)
Glucose-Capillary: 133 mg/dL — ABNORMAL HIGH (ref 70–99)
Glucose-Capillary: 166 mg/dL — ABNORMAL HIGH (ref 70–99)
Glucose-Capillary: 119 mg/dL — ABNORMAL HIGH (ref 70–99)

## 2022-05-14 LAB — CBC
HCT: 25 % — ABNORMAL LOW (ref 39.0–52.0)
Hemoglobin: 7.6 g/dL — ABNORMAL LOW (ref 13.0–17.0)
MCH: 27.5 pg (ref 26.0–34.0)
MCHC: 30.4 g/dL (ref 30.0–36.0)
MCV: 90.6 fL (ref 80.0–100.0)
Platelets: 122 10*3/uL — ABNORMAL LOW (ref 150–400)
RBC: 2.76 MIL/uL — ABNORMAL LOW (ref 4.22–5.81)
RDW: 17.3 % — ABNORMAL HIGH (ref 11.5–15.5)
WBC: 9 10*3/uL (ref 4.0–10.5)
nRBC: 0 % (ref 0.0–0.2)

## 2022-05-14 LAB — RENAL FUNCTION PANEL
Albumin: 2 g/dL — ABNORMAL LOW (ref 3.5–5.0)
Anion gap: 12 (ref 5–15)
BUN: 24 mg/dL — ABNORMAL HIGH (ref 8–23)
CO2: 27 mmol/L (ref 22–32)
Calcium: 8.1 mg/dL — ABNORMAL LOW (ref 8.9–10.3)
Chloride: 93 mmol/L — ABNORMAL LOW (ref 98–111)
Creatinine, Ser: 3.43 mg/dL — ABNORMAL HIGH (ref 0.61–1.24)
GFR, Estimated: 19 mL/min — ABNORMAL LOW (ref >60)
Glucose, Bld: 137 mg/dL — ABNORMAL HIGH (ref 70–99)
Phosphorus: 3 mg/dL (ref 2.5–4.6)
Potassium: 3.6 mmol/L (ref 3.5–5.1)
Sodium: 132 mmol/L — ABNORMAL LOW (ref 135–145)

## 2022-05-14 MED ORDER — PENTAFLUOROPROP-TETRAFLUOROETH EX AERO: 1.0000 | INHALATION_SPRAY | CUTANEOUS | Status: DC | PRN

## 2022-05-14 MED ORDER — LIDOCAINE HCL (PF) 1 % IJ SOLN
5.0000 mL | INTRAMUSCULAR | Status: DC | PRN
  Filled 2022-05-14: qty 5

## 2022-05-14 MED ORDER — LIDOCAINE-PRILOCAINE 2.5-2.5 % EX CREA
1.0000 | TOPICAL_CREAM | CUTANEOUS | Status: DC | PRN
  Filled 2022-05-14: qty 5

## 2022-05-14 NOTE — Plan of Care (Signed)
  Problem: Education: Goal: Ability to describe self-care measures that may prevent or decrease complications (Diabetes Survival Skills Education) will improve Outcome: Progressing   Problem: Coping: Goal: Ability to adjust to condition or change in health will improve Outcome: Progressing   Problem: Health Behavior/Discharge Planning: Goal: Ability to identify and utilize available resources and services will improve Outcome: Progressing   Problem: Nutritional: Goal: Maintenance of adequate nutrition will improve Outcome: Progressing   Problem: Skin Integrity: Goal: Risk for impaired skin integrity will decrease Outcome: Progressing   Problem: Tissue Perfusion: Goal: Adequacy of tissue perfusion will improve Outcome: Progressing

## 2022-05-14 NOTE — Progress Notes (Signed)
[facility] Progress Note   Subjective:  Seen in room. Says slept all day yesterday d/t pain meds, feeling a little better today. No CP or dyspnea. For HD today.  Objective Vitals:   05/13/22 2119 05/13/22 2351 05/14/22 0355 05/14/22 0853  BP: 106/69 113/64 114/69 126/75  Pulse: 89 (!) 102 91 96  Resp: '16 18 16 17  '$ Temp: 98.3 F (36.8 C) 98.3 F (36.8 C) 97.6 F (36.4 C) 98.4 F (36.9 C)  TempSrc: Oral Oral Oral Oral  SpO2: 100% 99% 100% 100%  Weight:      Height:       Physical Exam General: Well appearing man, NAD. Room air. Heart: RRR; no murmur Lungs: CTAB; no rales Abdomen: soft, non-tender Extremities: R AKA without stump edema, L BKA in hard brace Dialysis Access: RUE AVF + bruit  Additional Objective Labs: Basic Metabolic Panel: Recent Labs  Lab 05/11/22 0840 05/12/22 0141 05/13/22 0104  NA 132* 136 134*  K 3.3* 3.4* 3.2*  CL 92* 92* 96*  CO2  --  28 29  GLUCOSE 135* 174* 105*  BUN '17 17 13  '$ CREATININE 3.00* 3.38* 2.60*  CALCIUM  --  8.2* 8.0*  PHOS  --  4.6 2.7   Liver Function Tests: Recent Labs  Lab 05/12/22 0141 05/13/22 0104  ALBUMIN 2.3* 2.2*   CBC: Recent Labs  Lab 05/11/22 0840 05/12/22 0141 05/13/22 0104  WBC  --  8.6 8.8  HGB 11.9* 9.0* 7.6*  HCT 35.0* 28.6* 25.6*  MCV  --  89.7 91.8  PLT  --  117* 126*   Medications:  sodium chloride Stopped (05/11/22 1327)   ferric gluconate (FERRLECIT) IVPB     magnesium sulfate bolus IVPB      amoxicillin  500 mg Oral QHS   apixaban  5 mg Oral BID   vitamin C  1,000 mg Oral Daily   atorvastatin  20 mg Oral QHS   Chlorhexidine Gluconate Cloth  6 each Topical Q0600   docusate sodium  100 mg Oral Daily   doxycycline  100 mg Oral BID   furosemide  80 mg Oral Once per day on Sun Tue Thu Sat   gabapentin  300 mg Oral TID   insulin aspart  0-9 Units Subcutaneous TID WC   insulin aspart  3 Units Subcutaneous TID WC   metoprolol tartrate  25 mg Oral BID   nutrition  supplement (JUVEN)  1 packet Oral BID BM   pantoprazole  40 mg Oral Daily   zinc sulfate  220 mg Oral Daily    Dialysis Orders: MWF GOC 4h 65mn  400/800  85kg  2/2.5 bath  RUA AVF  heparin none - soft BP's in general - mircera 225 q2, last 6/21 - iron sucrose '100mg'$  q hd thru 7/10 - last Hb 8.9 on 6/28, tsat 17%, ferr 1030   Assessment/Plan: L foot gangrene s/p L BKA 05/11/22: Per ortho/Dr. DSharol Given Pain controlled today.   Atrial fib - takes metoprolol 25 bid and eliquis, rate currently controlled ESRD: Continue HD per usual MWF schedule - HD today, no heparin. K 3.2, using ^ K bath. HTN/ volume: BP stable, above prior EDW despite amputation -> will follow post-HD today. Anemia of  ESRD: Post-op drop, down to 7.6. Will be due for ESA on 7/5.  Secondary HPTH: CorrCa ok, Phos low side without binder - follow.  Nutrition: Alb low, continue protein supps and MVI. T2MD: On SSI, per primary. 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 Follows with cardiology.   Veneta Penton, PA-C 05/14/2022, 9:32 AM  [employer]

## 2022-05-14 NOTE — Plan of Care (Signed)
No acute events overnight.    Problem: Education: Goal: Ability to describe self-care measures that may prevent or decrease complications (Diabetes Survival Skills Education) will improve Outcome: Progressing Goal: Individualized Educational Video(s) Outcome: Progressing   Problem: Coping: Goal: Ability to adjust to condition or change in health will improve Outcome: Progressing   Problem: Fluid Volume: Goal: Ability to maintain a balanced intake and output will improve Outcome: Progressing   Problem: Health Behavior/Discharge Planning: Goal: Ability to identify and utilize available resources and services will improve Outcome: Progressing Goal: Ability to manage health-related needs will improve Outcome: Progressing   Problem: Metabolic: Goal: Ability to maintain appropriate glucose levels will improve Outcome: Progressing   Problem: Nutritional: Goal: Maintenance of adequate nutrition will improve Outcome: Progressing Goal: Progress toward achieving an optimal weight will improve Outcome: Progressing   Problem: Skin Integrity: Goal: Risk for impaired skin integrity will decrease Outcome: Progressing   Problem: Tissue Perfusion: Goal: Adequacy of tissue perfusion will improve Outcome: Progressing   Problem: Education: Goal: Knowledge of the prescribed therapeutic regimen will improve Outcome: Progressing Goal: Ability to verbalize activity precautions or restrictions will improve Outcome: Progressing Goal: Understanding of discharge needs will improve Outcome: Progressing   Problem: Activity: Goal: Ability to perform//tolerate increased activity and mobilize with assistive devices will improve Outcome: Progressing   Problem: Clinical Measurements: Goal: Postoperative complications will be avoided or minimized Outcome: Progressing   Problem: Self-Care: Goal: Ability to meet self-care needs will improve Outcome: Progressing   Problem: Self-Concept: Goal:  Ability to maintain and perform role responsibilities to the fullest extent possible will improve Outcome: Progressing   Problem: Pain Management: Goal: Pain level will decrease with appropriate interventions Outcome: Progressing   Problem: Skin Integrity: Goal: Demonstration of wound healing without infection will improve Outcome: Progressing   Problem: Education: Goal: Knowledge of General Education information will improve Description: Including pain rating scale, medication(s)/side effects and non-pharmacologic comfort measures Outcome: Progressing   Problem: Health Behavior/Discharge Planning: Goal: Ability to manage health-related needs will improve Outcome: Progressing   Problem: Clinical Measurements: Goal: Ability to maintain clinical measurements within normal limits will improve Outcome: Progressing Goal: Will remain free from infection Outcome: Progressing Goal: Diagnostic test results will improve Outcome: Progressing Goal: Respiratory complications will improve Outcome: Progressing Goal: Cardiovascular complication will be avoided Outcome: Progressing   Problem: Activity: Goal: Risk for activity intolerance will decrease Outcome: Progressing   Problem: Nutrition: Goal: Adequate nutrition will be maintained Outcome: Progressing   Problem: Coping: Goal: Level of anxiety will decrease Outcome: Progressing   Problem: Elimination: Goal: Will not experience complications related to bowel motility Outcome: Progressing Goal: Will not experience complications related to urinary retention Outcome: Progressing   Problem: Pain Managment: Goal: General experience of comfort will improve Outcome: Progressing   Problem: Safety: Goal: Ability to remain free from injury will improve Outcome: Progressing   Problem: Skin Integrity: Goal: Risk for impaired skin integrity will decrease Outcome: Progressing

## 2022-05-14 NOTE — Progress Notes (Signed)
Patient ID: Alexander [name], male   DOB: 11/08/1961, 61 y.o.   MRN: [id number] Patient is postoperative day 3 transtibial amputation on the left.  Patient states he feels comfortable discharging to home.  There is no drainage in the wound VAC canister.  Will discharge to home when therapy feels patient is safe for discharge with home health therapy.

## 2022-05-15 LAB — GLUCOSE, CAPILLARY: Glucose-Capillary: 216 mg/dL — ABNORMAL HIGH (ref 70–99)

## 2022-05-15 MED ORDER — OXYCODONE-ACETAMINOPHEN 5-325 MG PO TABS: 1.0000 | ORAL_TABLET | ORAL | 0 refills | Status: DC | PRN

## 2022-05-15 NOTE — Plan of Care (Signed)
No acute events overnight.    Problem: Education: Goal: Ability to describe self-care measures that may prevent or decrease complications (Diabetes Survival Skills Education) will improve Outcome: Progressing Goal: Individualized Educational Video(s) Outcome: Progressing   Problem: Coping: Goal: Ability to adjust to condition or change in health will improve Outcome: Progressing   Problem: Fluid Volume: Goal: Ability to maintain a balanced intake and output will improve Outcome: Progressing   Problem: Health Behavior/Discharge Planning: Goal: Ability to identify and utilize available resources and services will improve Outcome: Progressing Goal: Ability to manage health-related needs will improve Outcome: Progressing   Problem: Metabolic: Goal: Ability to maintain appropriate glucose levels will improve Outcome: Progressing   Problem: Nutritional: Goal: Maintenance of adequate nutrition will improve Outcome: Progressing Goal: Progress toward achieving an optimal weight will improve Outcome: Progressing   Problem: Skin Integrity: Goal: Risk for impaired skin integrity will decrease Outcome: Progressing   Problem: Tissue Perfusion: Goal: Adequacy of tissue perfusion will improve Outcome: Progressing   Problem: Education: Goal: Knowledge of the prescribed therapeutic regimen will improve Outcome: Progressing Goal: Ability to verbalize activity precautions or restrictions will improve Outcome: Progressing Goal: Understanding of discharge needs will improve Outcome: Progressing   Problem: Activity: Goal: Ability to perform//tolerate increased activity and mobilize with assistive devices will improve Outcome: Progressing   Problem: Clinical Measurements: Goal: Postoperative complications will be avoided or minimized Outcome: Progressing   Problem: Self-Care: Goal: Ability to meet self-care needs will improve Outcome: Progressing   Problem: Self-Concept: Goal:  Ability to maintain and perform role responsibilities to the fullest extent possible will improve Outcome: Progressing   Problem: Pain Management: Goal: Pain level will decrease with appropriate interventions Outcome: Progressing   Problem: Skin Integrity: Goal: Demonstration of wound healing without infection will improve Outcome: Progressing   Problem: Education: Goal: Knowledge of General Education information will improve Description: Including pain rating scale, medication(s)/side effects and non-pharmacologic comfort measures Outcome: Progressing   Problem: Health Behavior/Discharge Planning: Goal: Ability to manage health-related needs will improve Outcome: Progressing   Problem: Clinical Measurements: Goal: Ability to maintain clinical measurements within normal limits will improve Outcome: Progressing Goal: Will remain free from infection Outcome: Progressing Goal: Diagnostic test results will improve Outcome: Progressing Goal: Respiratory complications will improve Outcome: Progressing Goal: Cardiovascular complication will be avoided Outcome: Progressing   Problem: Activity: Goal: Risk for activity intolerance will decrease Outcome: Progressing   Problem: Nutrition: Goal: Adequate nutrition will be maintained Outcome: Progressing   Problem: Coping: Goal: Level of anxiety will decrease Outcome: Progressing   Problem: Elimination: Goal: Will not experience complications related to bowel motility Outcome: Progressing Goal: Will not experience complications related to urinary retention Outcome: Progressing   Problem: Pain Managment: Goal: General experience of comfort will improve Outcome: Progressing   Problem: Safety: Goal: Ability to remain free from injury will improve Outcome: Progressing   Problem: Skin Integrity: Goal: Risk for impaired skin integrity will decrease Outcome: Progressing

## 2022-05-15 NOTE — Discharge Summary (Signed)
Discharge Diagnoses:  Principal Problem:   Gangrene ([name]) Active Problems:   Below-knee amputation of left lower extremity ([name])   Surgeries: Procedure(s): LEFT BELOW KNEE AMPUTATION APPLICATION OF WOUND VAC on 05/11/2022    Consultants: Treatment Team:  Roney Jaffe, MD  Discharged Condition: Improved  Hospital Course: Alexander [name] is an 61 y.o. male who was admitted 05/11/2022 with a chief complaint of abscess and osteomyelitis left foot, with a final diagnosis of Abscess Osteomyelitis Left Foot.  Patient was brought to the operating room on 05/11/2022 and underwent Procedure(s): LEFT BELOW KNEE AMPUTATION APPLICATION OF WOUND VAC.    Patient was given perioperative antibiotics:  Anti-infectives (From admission, onward)    Start     Dose/Rate Route Frequency Ordered Stop   05/12/22 2200  amoxicillin (AMOXIL) capsule 500 mg        500 mg Oral Daily at bedtime 05/12/22 1911     05/12/22 2200  doxycycline (VIBRA-TABS) tablet 100 mg        100 mg Oral 2 times daily 05/12/22 1911     05/11/22 1700  ceFAZolin (ANCEF) IVPB 2g/100 mL premix        2 g 200 mL/hr over 30 Minutes Intravenous Every 8 hours 05/11/22 1145 05/12/22 0138   05/11/22 0800  ceFAZolin (ANCEF) IVPB 2g/100 mL premix        2 g 200 mL/hr over 30 Minutes Intravenous On call to O.R. 05/11/22 4917 05/11/22 0949     .  Patient was given sequential compression devices, early ambulation, and aspirin for DVT prophylaxis.  Recent vital signs: Patient Vitals for the past 24 hrs:  BP Temp Temp src Pulse Resp SpO2 Weight  05/15/22 0837 (!) 97/52 98.2 F (36.8 C) Oral 93 17 (!) 87 % --  05/15/22 0351 (!) 116/95 98.1 F (36.7 C) -- 91 17 99 % --  05/14/22 2008 108/66 98.4 F (36.9 C) Oral (!) 54 17 100 % --  05/14/22 1802 113/62 98 F (36.7 C) Oral 81 18 96 % --  05/14/22 1737 -- -- -- -- -- -- 83.2 kg  05/14/22 1719 121/66 98.6 F (37 C) -- 94 (!) 27 98 % --  05/14/22 1700 100/68 -- -- 94 (!) 22 100 % --   05/14/22 1630 109/69 -- -- 91 (!) 25 100 % --  05/14/22 1600 110/72 -- -- 88 15 (!) 72 % --  05/14/22 1530 109/67 -- -- 88 20 100 % --  05/14/22 1500 (!) 107/58 -- -- 96 (!) 22 100 % --  05/14/22 1430 108/67 -- -- 94 15 100 % --  05/14/22 1400 109/68 -- -- 93 (!) 24 100 % --  05/14/22 1330 115/67 -- -- 89 (!) 24 99 % --  05/14/22 1310 102/63 -- -- 85 20 98 % --  05/14/22 1303 107/71 -- -- 84 14 99 % --  05/14/22 1300 107/71 98.2 F (36.8 C) Oral 87 16 -- --  05/14/22 1250 -- -- -- -- -- -- 86.3 kg  .  Recent laboratory studies: No results found.  Discharge Medications:   Allergies as of 05/15/2022       Reactions   Cytoxan [cyclophosphamide] Other (See Comments)   Pancytopenia   Pletal [cilostazol] Palpitations, Other (See Comments)   "Can hear heart beating loudly". Other reaction(s): Unknown   Keflex [cephalexin] Nausea And Vomiting   Other reaction(s): Unknown   Tylenol [acetaminophen] Other (See Comments)   Patient has a cold sweat   Iron Sucrose  Other reaction(s): Unknown   Sulfa Antibiotics Rash   Other reaction(s): Unknown        Medication List     STOP taking these medications    doxycycline 100 MG tablet Commonly known as: VIBRA-TABS   methocarbamol 500 MG tablet Commonly known as: ROBAXIN       TAKE these medications    Accu-Chek Advantage Diabetes kit Use as instructed   Accu-Chek FastClix Lancets Misc Check blood sugar TID & QHS   amoxicillin 500 MG capsule Commonly known as: AMOXIL Take 1 capsule (500 mg total) by mouth at bedtime.   apixaban 5 MG Tabs tablet Commonly known as: Eliquis Take 1 tablet (5 mg total) by mouth 2 (two) times daily.   atorvastatin 20 MG tablet Commonly known as: LIPITOR Take 1 tablet (20 mg total) by mouth at bedtime.   blood glucose meter kit and supplies Kit Check blood sugar TID & QHS   furosemide 80 MG tablet Commonly known as: LASIX Take 80 mg by mouth See admin instructions. Take 80 mg by  mouth on (non-dialysis days) Tuesdays, Thursdays, Saturdays & Sundays in the morning.   gabapentin 300 MG capsule Commonly known as: NEURONTIN Take 1 capsule (300 mg total) by mouth 3 (three) times daily. 3 times a day when necessary neuropathy pain What changed: Another medication with the same name was removed. Continue taking this medication, and follow the directions you see here.   glucose blood test strip Use as instructed   glucose blood test strip Commonly known as: Accu-Chek Aviva Plus Use as instructed   glucose blood test strip Commonly known as: Choice DM Fora G20 Test Strips Check blood sugar TID & QHS   HumuLIN 70/30 KwikPen (70-30) 100 UNIT/ML KwikPen Generic drug: insulin isophane & regular human KwikPen Inject 0-20 Units into the skin in the morning and at bedtime. Sliding scale (if less than 130=no insulin)   lidocaine-prilocaine cream Commonly known as: EMLA Apply 1 application  topically as needed (prior to fistula access).   metoprolol tartrate 25 MG tablet Commonly known as: LOPRESSOR Take 1 tablet (25 mg total) by mouth 2 (two) times daily.   MIRCERA IJ Mircera   oxyCODONE-acetaminophen 5-325 MG tablet Commonly known as: PERCOCET/ROXICET Take 1 tablet by mouth every 4 (four) hours as needed. What changed: reasons to take this   pantoprazole 40 MG tablet Commonly known as: PROTONIX Take 40 mg by mouth daily.        Diagnostic Studies: DG CHEST PORT 1 VIEW  Result Date: 05/11/2022 CLINICAL DATA:  Hypoxemia. EXAM: PORTABLE CHEST 1 VIEW COMPARISON:  Chest radiograph 11/03/2021. FINDINGS: Ill-defined opacities at the lateral right lung base. Probable small right pleural effusion. Left lung is clear. Similar mild enlargement the cardiac silhouette. Left subclavian approach cardiac rhythm maintenance device. No evidence of acute osseous abnormality. IMPRESSION: Probable small right pleural effusion with overlying right basilar opacities that could  represent atelectasis and/or pneumonia. Electronically Signed   By: Margaretha Sheffield M.D.   On: 05/11/2022 13:46    Patient benefited maximally from their hospital stay and there were no complications.     Disposition: Discharge disposition: 01-Home or Self Care      Discharge Instructions     Call MD / Call 911   Complete by: As directed    If you experience chest pain or shortness of breath, CALL 911 and be transported to the hospital emergency room.  If you develope a fever above 101 F, pus (white drainage)  or increased drainage or redness at the wound, or calf pain, call your surgeon's office.   Constipation Prevention   Complete by: As directed    Drink plenty of fluids.  Prune juice may be helpful.  You may use a stool softener, such as Colace (over the counter) 100 mg twice a day.  Use MiraLax (over the counter) for constipation as needed.   Diet - low sodium heart healthy   Complete by: As directed    Increase activity slowly as tolerated   Complete by: As directed    Negative Pressure Wound Therapy - Incisional   Complete by: As directed    Negative Pressure Wound Therapy - Incisional   Complete by: As directed    Attached the wound VAC dressing to the Praveena plus portable wound VAC pump for discharge   Post-operative opioid taper instructions:   Complete by: As directed    POST-OPERATIVE OPIOID TAPER INSTRUCTIONS: It is important to wean off of your opioid medication as soon as possible. If you do not need pain medication after your surgery it is ok to stop day one. Opioids include: Codeine, Hydrocodone(Norco, Vicodin), Oxycodone(Percocet, oxycontin) and hydromorphone amongst others.  Long term and even short term use of opiods can cause: Increased pain response Dependence Constipation Depression Respiratory depression And more.  Withdrawal symptoms can include Flu like symptoms Nausea, vomiting And more Techniques to manage these symptoms Hydrate well Eat  regular healthy meals Stay active Use relaxation techniques(deep breathing, meditating, yoga) Do Not substitute Alcohol to help with tapering If you have been on opioids for less than two weeks and do not have pain than it is ok to stop all together.  Plan to wean off of opioids This plan should start within one week post op of your joint replacement. Maintain the same interval or time between taking each dose and first decrease the dose.  Cut the total daily intake of opioids by one tablet each day Next start to increase the time between doses. The last dose that should be eliminated is the evening dose.          Follow-up Information     Newt Minion, MD Follow up in 1 week(s).   Specialty: Orthopedic Surgery Contact information: [place] [address] [place], [place] Follow up.   Contact information: [address] [place] [address] [phone]                  Signed: Newt Minion 05/15/2022, 8:53 AM

## 2022-05-15 NOTE — Progress Notes (Signed)
Patient ID: Alexander [name], male   DOB: 1960/12/22, 61 y.o.   MRN: [id number] Patient received dialysis yesterday.  He states he feels ready for discharge today.  We will plan for discharge to home.

## 2022-05-15 NOTE — Discharge Summary (Signed)
Discharge Diagnoses:  Principal Problem:   Gangrene ([name]) Active Problems:   Below-knee amputation of left lower extremity ([name])   Surgeries: Procedure(s): LEFT BELOW KNEE AMPUTATION APPLICATION OF WOUND VAC on 05/11/2022    Consultants: Treatment Team:  Roney Jaffe, MD  Discharged Condition: Improved  Hospital Course: Alexander [name] is an 61 y.o. male who was admitted 05/11/2022 with a chief complaint of abscess and osteomyelitis left foot, with a final diagnosis of Abscess Osteomyelitis Left Foot.  Patient was brought to the operating room on 05/11/2022 and underwent Procedure(s): LEFT BELOW KNEE AMPUTATION APPLICATION OF WOUND VAC.    Patient was given perioperative antibiotics:  Anti-infectives (From admission, onward)    Start     Dose/Rate Route Frequency Ordered Stop   05/12/22 2200  amoxicillin (AMOXIL) capsule 500 mg        500 mg Oral Daily at bedtime 05/12/22 1911     05/12/22 2200  doxycycline (VIBRA-TABS) tablet 100 mg        100 mg Oral 2 times daily 05/12/22 1911     05/11/22 1700  ceFAZolin (ANCEF) IVPB 2g/100 mL premix        2 g 200 mL/hr over 30 Minutes Intravenous Every 8 hours 05/11/22 1145 05/12/22 0138   05/11/22 0800  ceFAZolin (ANCEF) IVPB 2g/100 mL premix        2 g 200 mL/hr over 30 Minutes Intravenous On call to O.R. 05/11/22 4917 05/11/22 0949     .  Patient was given sequential compression devices, early ambulation, and aspirin for DVT prophylaxis.  Recent vital signs: Patient Vitals for the past 24 hrs:  BP Temp Temp src Pulse Resp SpO2 Weight  05/15/22 0837 (!) 97/52 98.2 F (36.8 C) Oral 93 17 (!) 87 % --  05/15/22 0351 (!) 116/95 98.1 F (36.7 C) -- 91 17 99 % --  05/14/22 2008 108/66 98.4 F (36.9 C) Oral (!) 54 17 100 % --  05/14/22 1802 113/62 98 F (36.7 C) Oral 81 18 96 % --  05/14/22 1737 -- -- -- -- -- -- 83.2 kg  05/14/22 1719 121/66 98.6 F (37 C) -- 94 (!) 27 98 % --  05/14/22 1700 100/68 -- -- 94 (!) 22 100 % --   05/14/22 1630 109/69 -- -- 91 (!) 25 100 % --  05/14/22 1600 110/72 -- -- 88 15 (!) 72 % --  05/14/22 1530 109/67 -- -- 88 20 100 % --  05/14/22 1500 (!) 107/58 -- -- 96 (!) 22 100 % --  05/14/22 1430 108/67 -- -- 94 15 100 % --  05/14/22 1400 109/68 -- -- 93 (!) 24 100 % --  05/14/22 1330 115/67 -- -- 89 (!) 24 99 % --  05/14/22 1310 102/63 -- -- 85 20 98 % --  05/14/22 1303 107/71 -- -- 84 14 99 % --  05/14/22 1300 107/71 98.2 F (36.8 C) Oral 87 16 -- --  05/14/22 1250 -- -- -- -- -- -- 86.3 kg  .  Recent laboratory studies: No results found.  Discharge Medications:   Allergies as of 05/15/2022       Reactions   Cytoxan [cyclophosphamide] Other (See Comments)   Pancytopenia   Pletal [cilostazol] Palpitations, Other (See Comments)   "Can hear heart beating loudly". Other reaction(s): Unknown   Keflex [cephalexin] Nausea And Vomiting   Other reaction(s): Unknown   Tylenol [acetaminophen] Other (See Comments)   Patient has a cold sweat   Iron Sucrose  Other reaction(s): Unknown   Sulfa Antibiotics Rash   Other reaction(s): Unknown        Medication List     STOP taking these medications    methocarbamol 500 MG tablet Commonly known as: ROBAXIN       TAKE these medications    Accu-Chek Advantage Diabetes kit Use as instructed   Accu-Chek FastClix Lancets Misc Check blood sugar TID & QHS   amoxicillin 500 MG capsule Commonly known as: AMOXIL Take 1 capsule (500 mg total) by mouth at bedtime.   apixaban 5 MG Tabs tablet Commonly known as: Eliquis Take 1 tablet (5 mg total) by mouth 2 (two) times daily.   atorvastatin 20 MG tablet Commonly known as: LIPITOR Take 1 tablet (20 mg total) by mouth at bedtime.   blood glucose meter kit and supplies Kit Check blood sugar TID & QHS   doxycycline 100 MG tablet Commonly known as: VIBRA-TABS Take 100 mg by mouth 2 (two) times daily.   furosemide 80 MG tablet Commonly known as: LASIX Take 80 mg by mouth  See admin instructions. Take 80 mg by mouth on (non-dialysis days) Tuesdays, Thursdays, Saturdays & Sundays in the morning.   gabapentin 300 MG capsule Commonly known as: NEURONTIN Take 1 capsule (300 mg total) by mouth 3 (three) times daily. 3 times a day when necessary neuropathy pain What changed: Another medication with the same name was removed. Continue taking this medication, and follow the directions you see here.   glucose blood test strip Use as instructed   glucose blood test strip Commonly known as: Accu-Chek Aviva Plus Use as instructed   glucose blood test strip Commonly known as: Choice DM Fora G20 Test Strips Check blood sugar TID & QHS   HumuLIN 70/30 KwikPen (70-30) 100 UNIT/ML KwikPen Generic drug: insulin isophane & regular human KwikPen Inject 0-20 Units into the skin in the morning and at bedtime. Sliding scale (if less than 130=no insulin)   lidocaine-prilocaine cream Commonly known as: EMLA Apply 1 application  topically as needed (prior to fistula access).   metoprolol tartrate 25 MG tablet Commonly known as: LOPRESSOR Take 1 tablet (25 mg total) by mouth 2 (two) times daily.   MIRCERA IJ Mircera   oxyCODONE-acetaminophen 5-325 MG tablet Commonly known as: PERCOCET/ROXICET Take 1 tablet by mouth every 4 (four) hours as needed. What changed: reasons to take this   pantoprazole 40 MG tablet Commonly known as: PROTONIX Take 40 mg by mouth daily.        Diagnostic Studies: DG CHEST PORT 1 VIEW  Result Date: 05/11/2022 CLINICAL DATA:  Hypoxemia. EXAM: PORTABLE CHEST 1 VIEW COMPARISON:  Chest radiograph 11/03/2021. FINDINGS: Ill-defined opacities at the lateral right lung base. Probable small right pleural effusion. Left lung is clear. Similar mild enlargement the cardiac silhouette. Left subclavian approach cardiac rhythm maintenance device. No evidence of acute osseous abnormality. IMPRESSION: Probable small right pleural effusion with overlying  right basilar opacities that could represent atelectasis and/or pneumonia. Electronically Signed   By: Margaretha Sheffield M.D.   On: 05/11/2022 13:46    Patient benefited maximally from their hospital stay and there were no complications.     Disposition: Discharge disposition: 01-Home or Self Care      Discharge Instructions     Call MD / Call 911   Complete by: As directed    If you experience chest pain or shortness of breath, CALL 911 and be transported to the hospital emergency room.  If you  develope a fever above 101 F, pus (white drainage) or increased drainage or redness at the wound, or calf pain, call your surgeon's office.   Constipation Prevention   Complete by: As directed    Drink plenty of fluids.  Prune juice may be helpful.  You may use a stool softener, such as Colace (over the counter) 100 mg twice a day.  Use MiraLax (over the counter) for constipation as needed.   Diet - low sodium heart healthy   Complete by: As directed    Increase activity slowly as tolerated   Complete by: As directed    Negative Pressure Wound Therapy - Incisional   Complete by: As directed    Negative Pressure Wound Therapy - Incisional   Complete by: As directed    Attached the wound VAC dressing to the Praveena plus portable wound VAC pump for discharge   Post-operative opioid taper instructions:   Complete by: As directed    POST-OPERATIVE OPIOID TAPER INSTRUCTIONS: It is important to wean off of your opioid medication as soon as possible. If you do not need pain medication after your surgery it is ok to stop day one. Opioids include: Codeine, Hydrocodone(Norco, Vicodin), Oxycodone(Percocet, oxycontin) and hydromorphone amongst others.  Long term and even short term use of opiods can cause: Increased pain response Dependence Constipation Depression Respiratory depression And more.  Withdrawal symptoms can include Flu like symptoms Nausea, vomiting And more Techniques to manage  these symptoms Hydrate well Eat regular healthy meals Stay active Use relaxation techniques(deep breathing, meditating, yoga) Do Not substitute Alcohol to help with tapering If you have been on opioids for less than two weeks and do not have pain than it is ok to stop all together.  Plan to wean off of opioids This plan should start within one week post op of your joint replacement. Maintain the same interval or time between taking each dose and first decrease the dose.  Cut the total daily intake of opioids by one tablet each day Next start to increase the time between doses. The last dose that should be eliminated is the evening dose.          Follow-up Information     Newt Minion, MD Follow up in 1 week(s).   Specialty: Orthopedic Surgery Contact information: [place] [address] [place], [place] Follow up.   Contact information: [address] [place] [address] [phone]                  Signed: Newt Minion 05/15/2022, 9:50 AM

## 2022-05-15 NOTE — Progress Notes (Signed)
[facility] Progress Note   Subjective:  Seen in room. No CP/dyspnea. Pain controlled on tylenol alone, felt that oxycodone made him too sleepy. Tells me that he is going home today, with home PT.  Objective Vitals:   05/14/22 1802 05/14/22 2008 05/15/22 0351 05/15/22 0837  BP: 113/62 108/66 (!) 116/95 (!) 97/52  Pulse: 81 (!) 54 91 93  Resp: '18 17 17 17  '$ Temp: 98 F (36.7 C) 98.4 F (36.9 C) 98.1 F (36.7 C) 98.2 F (36.8 C)  TempSrc: Oral Oral  Oral  SpO2: 96% 100% 99% (!) 87%  Weight:      Height:       Physical Exam General: Well appearing man, NAD. Room air. Heart: RRR; no murmur Lungs: CTAB; no rales Abdomen: soft, non-tender Extremities: R AKA without stump edema, L BKA in hard brace Dialysis Access: RUE AVF + bruit  Additional Objective Labs: Basic Metabolic Panel: Recent Labs  Lab 05/12/22 0141 05/13/22 0104 05/14/22 1314  NA 136 134* 132*  K 3.4* 3.2* 3.6  CL 92* 96* 93*  CO2 '28 29 27  '$ GLUCOSE 174* 105* 137*  BUN 17 13 24*  CREATININE 3.38* 2.60* 3.43*  CALCIUM 8.2* 8.0* 8.1*  PHOS 4.6 2.7 3.0   Liver Function Tests: Recent Labs  Lab 05/12/22 0141 05/13/22 0104 05/14/22 1314  ALBUMIN 2.3* 2.2* 2.0*   CBC: Recent Labs  Lab 05/12/22 0141 05/13/22 0104 05/14/22 1314  WBC 8.6 8.8 9.0  HGB 9.0* 7.6* 7.6*  HCT 28.6* 25.6* 25.0*  MCV 89.7 91.8 90.6  PLT 117* 126* 122*   Medications:  sodium chloride Stopped (05/11/22 1327)   ferric gluconate (FERRLECIT) IVPB Stopped (05/14/22 1603)   magnesium sulfate bolus IVPB      amoxicillin  500 mg Oral QHS   apixaban  5 mg Oral BID   vitamin C  1,000 mg Oral Daily   atorvastatin  20 mg Oral QHS   Chlorhexidine Gluconate Cloth  6 each Topical Q0600   docusate sodium  100 mg Oral Daily   doxycycline  100 mg Oral BID   furosemide  80 mg Oral Once per day on Sun Tue Thu Sat   gabapentin  300 mg Oral TID   insulin aspart  0-9 Units Subcutaneous TID WC   insulin aspart  3 Units  Subcutaneous TID WC   metoprolol tartrate  25 mg Oral BID   nutrition supplement (JUVEN)  1 packet Oral BID BM   pantoprazole  40 mg Oral Daily   zinc sulfate  220 mg Oral Daily    Dialysis Orders: MWF GOC 4h 35mn  400/800  85kg  2/2.5 bath  RUA AVF  heparin none - mircera 225 q2, last 6/21 - iron sucrose '100mg'$  q hd thru 7/10 - last Hb 8.9 on 6/28, tsat 17%, ferr 1030   Assessment/Plan: L foot gangrene s/p L BKA 05/11/22: Per ortho/Dr. DSharol Given Pain controlled today.   Atrial fib - takes metoprolol 25 bid and eliquis, rate currently controlled ESRD: Continue HD per usual MWF schedule - HD tomorrow, as outpatient. HTN/ volume: BP stable, above prior EDW despite amputation -> will be 83kg range. Anemia of  ESRD: Post-op drop, down to 7.6. Will be due for ESA on 7/5.  Secondary HPTH: CorrCa ok, Phos low side without binder - follow.  Nutrition: Alb low, continue protein supps and MVI. T2MD: On SSI, per primary. 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 Follows with cardiology.     Veneta Penton, PA-C 05/15/2022, 9:14 AM  [employer]

## 2022-05-15 NOTE — TOC Initial Note (Signed)
Transition of Care [facility]) - Initial/Assessment Note    Patient Details  Name: Alexander [name] MRN: [id number] Date of Birth: 1961/06/11  Transition of Care [facility]) CM/SW Contact:    Marilu Favre, RN Phone Number: 05/15/2022, 8:25 AM  Clinical Narrative:                 Spoke to patient at bedside . Patient from home with spouse and daughter. Has all DME. Confirmed face sheet information.    Patient already active with [facility] for HHPT. Spoke to [place] with [facility] she will need resumption of care orders.   Expected Discharge Plan: [place] Barriers to Discharge: Continued Medical Work up   Patient Goals and CMS Choice Patient states their goals for this hospitalization and ongoing recovery are:: to return to home CMS Medicare.gov Compare Post Acute Care list provided to:: Patient Choice offered to / list presented to : Patient  Expected Discharge Plan and Services Expected Discharge Plan: [place]   Discharge Planning Services: CM Consult Post Acute Care Choice: [place] arrangements for the past 2 months: Single Family Home                   DME Agency: NA       HH Arranged: PT [place] Agency: [facility] ([place]) Date HH Agency Contacted: 05/15/22 Time [place]: [phone] Representative spoke with at [place]: Caryl Pina  Prior Living Arrangements/Services Living arrangements for the past 2 months: [place] with:: Adult Children, Spouse Patient language and need for interpreter reviewed:: Yes Do you feel safe going back to the place where you live?: Yes      Need for Family Participation in Patient Care: Yes (Comment) Care giver support system in place?: Yes (comment) Current home services: DME Criminal Activity/Legal Involvement Pertinent to Current Situation/Hospitalization: No - Comment as needed  Activities of Daily Living Home Assistive Devices/Equipment: Wheelchair, Crutches,  Radio producer (specify quad or straight), Walker (specify type), CBG Meter, Bedside commode/3-in-1 ADL Screening (condition at time of admission) Patient's cognitive ability adequate to safely complete daily activities?: Yes Is the patient deaf or have difficulty hearing?: No Does the patient have difficulty seeing, even when wearing glasses/contacts?: Yes Does the patient have difficulty concentrating, remembering, or making decisions?: No Patient able to express need for assistance with ADLs?: Yes Does the patient have difficulty dressing or bathing?: Yes Independently performs ADLs?: No Communication: Independent Dressing (OT): Needs assistance Is this a change from baseline?: Pre-admission baseline Grooming: Independent Feeding: Independent Bathing: Independent Toileting: Independent In/Out Bed: Needs assistance Is this a change from baseline?: Pre-admission baseline Walks in Home: Needs assistance Is this a change from baseline?: Pre-admission baseline Does the patient have difficulty walking or climbing stairs?: Yes Weakness of Legs: Both Weakness of Arms/Hands: Both  Permission Sought/Granted   Permission granted to share information with : No              Emotional Assessment Appearance:: Appears stated age Attitude/Demeanor/Rapport: Engaged Affect (typically observed): Accepting Orientation: : Oriented to Self, Oriented to Place, Oriented to  Time, Oriented to Situation Alcohol / Substance Use: Not Applicable Psych Involvement: No (comment)  Admission diagnosis:  Gangrene ([place]) [I96] Below-knee amputation of left lower extremity ([place]) [H85.277O] Patient Active Problem List   Diagnosis Date Noted   Gangrene ([place]) 05/11/2022   Below-knee amputation of left lower extremity ([place]) 05/11/2022   History of right above knee amputation ([place]) 05/10/2022  Goals of care, counseling/discussion    Palliative care by specialist    Dehiscence of amputation stump of right lower  extremity ([place])    Endocarditis 01/21/2022   Ulcer of right leg, limited to breakdown of skin ([place])    Below-knee amputation of right lower extremity ([place])    Ulcer of left foot, limited to breakdown of skin ([place])    History of transmetatarsal amputation of left foot (HCC)    Lumbar back pain with radiculopathy affecting left lower extremity 01/14/2022   Dyslipidemia 01/14/2022   Type 2 diabetes mellitus with chronic kidney disease, with long-term current use of insulin ([place]) 01/14/2022   GERD without esophagitis 01/14/2022   Pressure injury of skin 01/14/2022   Intractable back pain 01/13/2022   Blister 11/08/2021   Severe protein-calorie malnutrition ([place]) 11/02/2021   Pacemaker infection ([place])    Transaminitis    Tricuspid valve vegetation    Thrombocytopenia ([place]) 10/26/2021   Hyponatremia 10/26/2021   Acute on chronic diastolic CHF (congestive heart failure) ([place]) 10/26/2021   Atrial fibrillation with RVR ([place]) 10/25/2021   Symptomatic anemia 09/30/2021   ESRD on dialysis ([place]) 09/30/2021   PAF (paroxysmal atrial fibrillation) ([place]) 09/30/2021   NSVT (nonsustained ventricular tachycardia) (HCC)    Pancytopenia ([place])    Acute blood loss anemia 07/18/2020   Hemodialysis patient ([place]) 06/04/2020   Necrotizing glomerulonephritis 06/04/2020   Heme positive stool    Melena    Acute GI bleeding 05/23/2020   Acute hematogenous osteomyelitis of right foot ([place]) 03/11/2020   Gangrene of right foot ([place])    Diabetic foot ulcer associated with type 2 diabetes mellitus ([place]) 02/19/2020   Heel ulcer ([place]) 11/05/2019   Gangrene of extremity due to atherosclerosis of artery ([place]) 09/09/2017   Essential hypertension, benign 02/07/2015   Renal insufficiency 02/07/2015   Post PTCA 11/09/2014   S/P PTCA (percutaneous transluminal coronary angioplasty) 11/09/2014   Pacemaker: [place] DR MRI [id number] Dual chamber pacemaker 10/08/2014 10/08/2014   Encounter for care of pacemaker  10/08/2014   Sinus node dysfunction ([place]) 10/08/2014   Cardiac asystole ([place]) 10/07/2014   Cardiac syncope 10/07/2014   Diabetes mellitus with stage 3 chronic kidney disease ([place]) 10/07/2014   CAD (coronary artery disease), native coronary artery 10/07/2014   Diabetes mellitus due to underlying condition without complications ([place]) 97/67/3419   Anemia, unspecified 05/25/2014   Syncope 04/30/2014   Atherosclerosis of native arteries of the extremities with gangrene ([place]) 12/16/2013   Acute renal failure ([place]) 10/28/2013   PVD (peripheral vascular disease) ([place]) 10/27/2013   Leukocytosis 10/21/2013   Anemia 10/21/2013   Osteomyelitis of left foot ([place]) 08/19/2013   Spinal stenosis, lumbar region, with neurogenic claudication 06/12/2012    Class: Diagnosis of   Diabetes mellitus with end stage renal disease ([place]) 03/15/2009   ALCOHOL ABUSE 03/15/2009   TOBACCO USE 03/15/2009   HYPERTENSION 03/15/2009   PCP:  [name], MD Pharmacy:   [facility], [address] [place] [address] Phone: [phone] Fax: [phone]  [id number] [address]: [phone]: [phone] [facility] [address] [place]Phone: [phone]: [phone]    Social Determinants of Health (SDOH) Interventions    Readmission Risk Interventions    10/27/2021    3:51 PM 05/26/2020    1:00 PM 03/15/2020  4:52 PM  Readmission Risk Prevention Plan  Transportation Screening Complete Complete Complete  PCP or Specialist Appt within 5-7 Days   Complete  PCP or Specialist Appt within 3-5 Days Complete    Home Care Screening   Complete  Medication Review (RN CM)   Complete  HRI or Home Care Consult Complete    Social Work Consult for [place] Planning/Counseling Complete     Palliative Care Screening Not Applicable    Medication Review Press photographer) Complete Complete   PCP or Specialist appointment within 3-5 days of discharge  Complete   HRI or [place]  Complete   SW Recovery Care/Counseling Consult  Complete   Palliative Care Screening  Not [place]  Not Applicable

## 2022-05-15 NOTE — Progress Notes (Signed)
Physical Therapy Treatment Patient Details Name: Alexander [name] MRN: [id number] DOB: 1961-09-21 Today's Date: 05/15/2022   History of Present Illness Pt is a 61yo M s/p L BKA due to gangrene on 6/30. PMH: R AKA (01/31/22), afib, CAD, DM, pacemaker, GERD, HTN    PT Comments    Session focused on continued transfer training in preparation for discharge home. Pt able to self direct and guide PT for placement of wheelchair and slide board. Pt requiring min-mod assist for slide board transfers from bed <> wheelchair. Slightly more assist provided for going back uphill. Pt has adequate family support and assist to discharge home. Recommend HHPT.     Recommendations for follow up therapy are one component of a multi-disciplinary discharge planning process, led by the attending physician.  Recommendations may be updated based on patient status, additional functional criteria and insurance authorization.  Follow Up Recommendations  Home health PT (pt declining SNF)     Assistance Recommended at Discharge Intermittent Supervision/Assistance  Patient can return home with the following A lot of help with walking and/or transfers;A lot of help with bathing/dressing/bathroom;Assistance with cooking/housework;Assist for transportation;Help with stairs or ramp for entrance   Equipment Recommendations  None recommended by PT    Recommendations for Other Services       Precautions / Restrictions Precautions Precautions: Fall Precaution Comments: wound vac Required Braces or Orthoses: Other Brace Other Brace: L limb protector Restrictions Weight Bearing Restrictions: Yes RLE Weight Bearing: Non weight bearing LLE Weight Bearing: Non weight bearing     Mobility  Bed Mobility Overal bed mobility: Needs Assistance Bed Mobility: Supine to Sit, Sit to Supine     Supine to sit: Min assist Sit to supine: Min guard   General bed mobility comments: Pt initiating well, exiting towards right side of  bed, use of bed rail to push up. Assist with bed pad to scoot L hip out to edge of bed    Transfers Overall transfer level: Needs assistance Equipment used: Sliding board Transfers: Bed to chair/wheelchair/BSC            Lateral/Scoot Transfers: Min assist, Mod assist, +2 safety/equipment, With slide board General transfer comment: Pt requiring minA with slide board to scoot towards left from bed > wheelchair. slightly more assist provided for transitioning back to bed going uphill towards right    Ambulation/Gait               General Gait Details: unable   Stairs             Wheelchair Mobility    Modified Rankin (Stroke Patients Only)       Balance Overall balance assessment: Needs assistance Sitting-balance support: Bilateral upper extremity supported Sitting balance-Leahy Scale: Fair                                      Cognition Arousal/Alertness: Awake/alert Behavior During Therapy: WFL for tasks assessed/performed Overall Cognitive Status: Within Functional Limits for tasks assessed                                          Exercises      General Comments        Pertinent Vitals/Pain Pain Assessment Pain Assessment: Faces Faces Pain Scale: Hurts little more Pain Location: L residual limb  Pain Descriptors / Indicators: Other (Comment) (stinging) Pain Intervention(s): Monitored during session    Home Living                          Prior Function            PT Goals (current goals can now be found in the care plan section) Acute Rehab PT Goals Patient Stated Goal: to go home Potential to Achieve Goals: Good Progress towards PT goals: Progressing toward goals    Frequency    Min 3X/week      PT Plan Current plan remains appropriate    Co-evaluation              AM-PAC PT "6 Clicks" Mobility   Outcome Measure  Help needed turning from your back to your side while in a  flat bed without using bedrails?: A Little Help needed moving from lying on your back to sitting on the side of a flat bed without using bedrails?: A Little Help needed moving to and from a bed to a chair (including a wheelchair)?: A Little Help needed standing up from a chair using your arms (e.g., wheelchair or bedside chair)?: Total Help needed to walk in hospital room?: Total Help needed climbing 3-5 steps with a railing? : Total 6 Click Score: 12    End of Session Equipment Utilized During Treatment: Gait belt Activity Tolerance: Patient tolerated treatment well Patient left: in bed;with call bell/phone within reach;with bed alarm set Nurse Communication: Mobility status PT Visit Diagnosis: Other abnormalities of gait and mobility (R26.89);Muscle weakness (generalized) (M62.81)     Time: 7619-5093 PT Time Calculation (min) (ACUTE ONLY): 22 min  Charges:  $Therapeutic Activity: 8-22 mins                     [name], PT, DPT Acute Rehabilitation Services Office [phone]    [name] 05/15/2022, 10:35 AM

## 2022-05-15 NOTE — Progress Notes (Signed)
Discharge instructions given with medication details. Verbalized understanding.  He is alert and oriented x4.

## 2022-05-16 ENCOUNTER — Telehealth (HOSPITAL_COMMUNITY): Payer: Self-pay | Admitting: Nephrology

## 2022-05-16 DIAGNOSIS — N2581 Secondary hyperparathyroidism of renal origin: Secondary | ICD-10-CM | POA: Diagnosis not present

## 2022-05-16 DIAGNOSIS — D631 Anemia in chronic kidney disease: Secondary | ICD-10-CM | POA: Diagnosis not present

## 2022-05-16 DIAGNOSIS — K31811 Angiodysplasia of stomach and duodenum with bleeding: Secondary | ICD-10-CM | POA: Diagnosis not present

## 2022-05-16 DIAGNOSIS — Z992 Dependence on renal dialysis: Secondary | ICD-10-CM | POA: Diagnosis not present

## 2022-05-16 DIAGNOSIS — D509 Iron deficiency anemia, unspecified: Secondary | ICD-10-CM | POA: Diagnosis not present

## 2022-05-16 DIAGNOSIS — N186 End stage renal disease: Secondary | ICD-10-CM | POA: Diagnosis not present

## 2022-05-16 LAB — DRUG TOX MONITOR 1 W/CONF, ORAL FLD
Amphetamines: NEGATIVE ng/mL (ref <10)
Barbiturates: NEGATIVE ng/mL (ref <10)
Benzodiazepines: NEGATIVE ng/mL (ref <0.50)
Buprenorphine: NEGATIVE ng/mL (ref <0.10)
Cocaine: NEGATIVE ng/mL (ref <5.0)
Codeine: NEGATIVE ng/mL (ref <2.5)
Cotinine: 18.9 ng/mL — ABNORMAL HIGH (ref <5.0)
Dihydrocodeine: NEGATIVE ng/mL (ref <2.5)
Fentanyl: NEGATIVE ng/mL (ref <0.10)
Heroin Metabolite: NEGATIVE ng/mL (ref <1.0)
Hydrocodone: NEGATIVE ng/mL (ref <2.5)
Hydromorphone: NEGATIVE ng/mL (ref <2.5)
MARIJUANA: NEGATIVE ng/mL (ref <2.5)
MDMA: NEGATIVE ng/mL (ref <10)
Meprobamate: NEGATIVE ng/mL (ref <2.5)
Methadone: NEGATIVE ng/mL (ref <5.0)
Morphine: NEGATIVE ng/mL (ref <2.5)
Nicotine Metabolite: POSITIVE ng/mL — AB (ref <5.0)
Norhydrocodone: NEGATIVE ng/mL (ref <2.5)
Noroxycodone: 6 ng/mL — ABNORMAL HIGH (ref <2.5)
Opiates: POSITIVE ng/mL — AB (ref <2.5)
Oxycodone: 32.2 ng/mL — ABNORMAL HIGH (ref <2.5)
Oxymorphone: NEGATIVE ng/mL (ref <2.5)
Phencyclidine: NEGATIVE ng/mL (ref <10)
Tapentadol: NEGATIVE ng/mL (ref <5.0)
Tramadol: NEGATIVE ng/mL (ref <5.0)
Zolpidem: NEGATIVE ng/mL (ref <5.0)

## 2022-05-16 LAB — SURGICAL PATHOLOGY

## 2022-05-16 LAB — DRUG TOX ALC METAB W/CON, ORAL FLD: Alcohol Metabolite: NEGATIVE ng/mL (ref <25)

## 2022-05-16 NOTE — Telephone Encounter (Signed)
Transition of care contact from inpatient facility  Date of discharge: 05/15/22 Date of contact: 05/16/22 Method: Phone Spoke to: Patient's wife  Patient contacted to discuss transition of care from recent inpatient hospitalization. Patient was [facility] to [facility] from 6/30-05/15/22 with discharge diagnosis of foot gangrene s/p BKA.  Medication changes were reviewed. Wife said pain is controlled, but he didn;t sleep that well last night due to having to get used to his bed again.  Patient will follow up with his/her outpatient HD unit on: MWF - he is there now.  No additional needs at this time. Reports that his HD unit has coordinated transportation.  Veneta Penton, PA-C [employer] Pager [phone]

## 2022-05-17 ENCOUNTER — Telehealth: Payer: Self-pay | Admitting: *Deleted

## 2022-05-17 NOTE — Telephone Encounter (Signed)
Oral swab drug screen was consistent for prescribed medications.  ?

## 2022-05-18 DIAGNOSIS — N186 End stage renal disease: Secondary | ICD-10-CM | POA: Diagnosis not present

## 2022-05-18 DIAGNOSIS — K31811 Angiodysplasia of stomach and duodenum with bleeding: Secondary | ICD-10-CM | POA: Diagnosis not present

## 2022-05-18 DIAGNOSIS — D631 Anemia in chronic kidney disease: Secondary | ICD-10-CM | POA: Diagnosis not present

## 2022-05-18 DIAGNOSIS — D509 Iron deficiency anemia, unspecified: Secondary | ICD-10-CM | POA: Diagnosis not present

## 2022-05-18 DIAGNOSIS — N2581 Secondary hyperparathyroidism of renal origin: Secondary | ICD-10-CM | POA: Diagnosis not present

## 2022-05-18 DIAGNOSIS — Z992 Dependence on renal dialysis: Secondary | ICD-10-CM | POA: Diagnosis not present

## 2022-05-23 DIAGNOSIS — D631 Anemia in chronic kidney disease: Secondary | ICD-10-CM | POA: Diagnosis not present

## 2022-05-23 DIAGNOSIS — D509 Iron deficiency anemia, unspecified: Secondary | ICD-10-CM | POA: Diagnosis not present

## 2022-05-23 DIAGNOSIS — N186 End stage renal disease: Secondary | ICD-10-CM | POA: Diagnosis not present

## 2022-05-23 DIAGNOSIS — K31811 Angiodysplasia of stomach and duodenum with bleeding: Secondary | ICD-10-CM | POA: Diagnosis not present

## 2022-05-23 DIAGNOSIS — Z992 Dependence on renal dialysis: Secondary | ICD-10-CM | POA: Diagnosis not present

## 2022-05-23 DIAGNOSIS — N2581 Secondary hyperparathyroidism of renal origin: Secondary | ICD-10-CM | POA: Diagnosis not present

## 2022-05-24 ENCOUNTER — Other Ambulatory Visit: Payer: Self-pay | Admitting: Student

## 2022-05-24 DIAGNOSIS — E1142 Type 2 diabetes mellitus with diabetic polyneuropathy: Secondary | ICD-10-CM | POA: Diagnosis not present

## 2022-05-24 DIAGNOSIS — I2510 Atherosclerotic heart disease of native coronary artery without angina pectoris: Secondary | ICD-10-CM

## 2022-05-24 DIAGNOSIS — I10 Essential (primary) hypertension: Secondary | ICD-10-CM

## 2022-05-24 DIAGNOSIS — E1151 Type 2 diabetes mellitus with diabetic peripheral angiopathy without gangrene: Secondary | ICD-10-CM | POA: Diagnosis not present

## 2022-05-24 DIAGNOSIS — I5022 Chronic systolic (congestive) heart failure: Secondary | ICD-10-CM | POA: Diagnosis not present

## 2022-05-24 DIAGNOSIS — G8929 Other chronic pain: Secondary | ICD-10-CM | POA: Diagnosis not present

## 2022-05-24 DIAGNOSIS — Z89512 Acquired absence of left leg below knee: Secondary | ICD-10-CM | POA: Diagnosis not present

## 2022-05-25 DIAGNOSIS — D631 Anemia in chronic kidney disease: Secondary | ICD-10-CM | POA: Diagnosis not present

## 2022-05-25 DIAGNOSIS — K31811 Angiodysplasia of stomach and duodenum with bleeding: Secondary | ICD-10-CM | POA: Diagnosis not present

## 2022-05-25 DIAGNOSIS — N2581 Secondary hyperparathyroidism of renal origin: Secondary | ICD-10-CM | POA: Diagnosis not present

## 2022-05-25 DIAGNOSIS — D509 Iron deficiency anemia, unspecified: Secondary | ICD-10-CM | POA: Diagnosis not present

## 2022-05-25 DIAGNOSIS — N186 End stage renal disease: Secondary | ICD-10-CM | POA: Diagnosis not present

## 2022-05-25 DIAGNOSIS — Z992 Dependence on renal dialysis: Secondary | ICD-10-CM | POA: Diagnosis not present

## 2022-05-28 DIAGNOSIS — D631 Anemia in chronic kidney disease: Secondary | ICD-10-CM | POA: Diagnosis not present

## 2022-05-28 DIAGNOSIS — N2581 Secondary hyperparathyroidism of renal origin: Secondary | ICD-10-CM | POA: Diagnosis not present

## 2022-05-28 DIAGNOSIS — D509 Iron deficiency anemia, unspecified: Secondary | ICD-10-CM | POA: Diagnosis not present

## 2022-05-28 DIAGNOSIS — K31811 Angiodysplasia of stomach and duodenum with bleeding: Secondary | ICD-10-CM | POA: Diagnosis not present

## 2022-05-28 DIAGNOSIS — N186 End stage renal disease: Secondary | ICD-10-CM | POA: Diagnosis not present

## 2022-05-28 DIAGNOSIS — Z992 Dependence on renal dialysis: Secondary | ICD-10-CM | POA: Diagnosis not present

## 2022-05-29 ENCOUNTER — Ambulatory Visit (INDEPENDENT_AMBULATORY_CARE_PROVIDER_SITE_OTHER): Payer: Medicare Other | Admitting: Family

## 2022-05-29 ENCOUNTER — Encounter: Payer: Self-pay | Admitting: Family

## 2022-05-29 DIAGNOSIS — Z89512 Acquired absence of left leg below knee: Secondary | ICD-10-CM

## 2022-05-29 MED ORDER — OXYCODONE-ACETAMINOPHEN 5-325 MG PO TABS: 1.0000 | ORAL_TABLET | Freq: Four times a day (QID) | ORAL | 0 refills | Status: DC | PRN

## 2022-05-29 NOTE — Progress Notes (Signed)
Post-Op Visit Note   Patient: Alexander [name]           Date of Birth: 12-Nov-1961           MRN: [id number] Visit Date: 05/29/2022 PCP: [name], MD  Chief Complaint: No chief complaint on file.   HPI:  HPI The patient is a 61 year old gentleman seen status post left below-knee amputation. Has been doing dry dressing changes. Has discontinued the limb protector due to pressure sore to knee from strap.  Ortho Exam On examination of the left residual limb staples are in place there is moderate serosanguineous drainage along his incision there is no gaping mild maceration no erythema no odor  Visit Diagnoses: No diagnosis found.  Plan: Continue daily Dial soap cleansing.  Dry dressings.  Change daily.  Wear shrinker around-the-clock once obtained.  States already has an order for his prosthesis set up  Follow-Up Instructions: No follow-ups on file.   Imaging: No results found.  Orders:  No orders of the defined types were placed in this encounter.  No orders of the defined types were placed in this encounter.    PMFS History: Patient Active Problem List   Diagnosis Date Noted   Gangrene ([place]) 05/11/2022   Below-knee amputation of left lower extremity ([place]) 05/11/2022   History of right above knee amputation ([place]) 05/10/2022   Goals of care, counseling/discussion    Palliative care by specialist    Dehiscence of amputation stump of right lower extremity ([place])    Endocarditis 01/21/2022   Ulcer of right leg, limited to breakdown of skin ([place])    Below-knee amputation of right lower extremity ([place])    Ulcer of left foot, limited to breakdown of skin ([place])    History of transmetatarsal amputation of left foot ([place])    Lumbar back pain with radiculopathy affecting left lower extremity 01/14/2022   Dyslipidemia 01/14/2022   Type 2 diabetes mellitus with chronic kidney disease, with long-term current use of insulin ([place]) 01/14/2022   GERD without esophagitis  01/14/2022   Pressure injury of skin 01/14/2022   Intractable back pain 01/13/2022   Blister 11/08/2021   Severe protein-calorie malnutrition ([place]) 11/02/2021   Pacemaker infection ([place])    Transaminitis    Tricuspid valve vegetation    Thrombocytopenia ([place]) 10/26/2021   Hyponatremia 10/26/2021   Acute on chronic diastolic CHF (congestive heart failure) ([place]) 10/26/2021   Atrial fibrillation with RVR ([place]) 10/25/2021   Symptomatic anemia 09/30/2021   ESRD on dialysis ([place]) 09/30/2021   PAF (paroxysmal atrial fibrillation) ([place]) 09/30/2021   NSVT (nonsustained ventricular tachycardia) (HCC)    Pancytopenia ([place])    Acute blood loss anemia 07/18/2020   Hemodialysis patient ([place]) 06/04/2020   Necrotizing glomerulonephritis 06/04/2020   Heme positive stool    Melena    Acute GI bleeding 05/23/2020   Acute hematogenous osteomyelitis of right foot ([place]) 03/11/2020   Gangrene of right foot ([place])    Diabetic foot ulcer associated with type 2 diabetes mellitus ([place]) 02/19/2020   Heel ulcer ([place]) 11/05/2019   Gangrene of extremity due to atherosclerosis of artery ([place]) 09/09/2017   Essential hypertension, benign 02/07/2015   Renal insufficiency 02/07/2015   Post PTCA 11/09/2014   S/P PTCA (percutaneous transluminal coronary angioplasty) 11/09/2014   Pacemaker: [place] DR MRI [id number] Dual chamber pacemaker 10/08/2014 10/08/2014   Encounter for care of pacemaker 10/08/2014   Sinus node dysfunction (HCC) 10/08/2014   Cardiac asystole ([place]) 10/07/2014   Cardiac syncope  10/07/2014   Diabetes mellitus with stage 3 chronic kidney disease ([place]) 10/07/2014   CAD (coronary artery disease), native coronary artery 10/07/2014   Diabetes mellitus due to underlying condition without complications ([place]) 87/68/1157   Anemia, unspecified 05/25/2014   Syncope 04/30/2014   Atherosclerosis of native arteries of the extremities with gangrene ([place]) 12/16/2013   Acute renal failure ([place]) 10/28/2013    PVD (peripheral vascular disease) ([place]) 10/27/2013   Leukocytosis 10/21/2013   Anemia 10/21/2013   Osteomyelitis of left foot ([place]) 08/19/2013   Spinal stenosis, lumbar region, with neurogenic claudication 06/12/2012    Class: Diagnosis of   Diabetes mellitus with end stage renal disease ([place]) 03/15/2009   ALCOHOL ABUSE 03/15/2009   TOBACCO USE 03/15/2009   HYPERTENSION 03/15/2009   Past Medical History:  Diagnosis Date   Acute blood loss anemia    Alcohol abuse    Anemia    Anemia of chronic disease    Arthritis    "patient does not think so."   Atrial fibrillation ([place])    Cardiac syncope 10/07/2014   rapid A fib with 8 sec pauses on converison with syncope- temp pacing wire placed then PPM   Cataract    BILATERAL   Coronary artery disease    with stents   Diabetes mellitus    dx---been  awhile   Elevated LFTs    Endocarditis of tricuspid valve    ESRD (end stage renal disease) (HCC)    M/W/F   GERD (gastroesophageal reflux disease)    History of blood transfusion    Hyperlipemia    Hypertension    Malnutrition ([place])    Osteomyelitis (HCC)    right foot   Peripheral vascular disease (HCC)    Pneumonia    Presence of permanent cardiac pacemaker 10/08/2014   [employer]onic    Family History  Problem Relation Age of Onset   Diabetes type II Mother    Hypertension Mother    Diabetes Mother    Liver cancer Father    Diabetes type II Sister    Breast cancer Sister    Diabetes Sister    Hypertension Sister    Diabetes type II Brother    Kidney failure Brother    Diabetes Brother    Hypertension Brother    Diabetes type II Sister     Past Surgical History:  Procedure Laterality Date   A/V FISTULAGRAM Right 05/01/2021   Procedure: A/V FISTULAGRAM;  Surgeon: Waynetta Sandy, MD;  Location: [place] [facility];  Service: Cardiovascular;  Laterality: Right;  failed fistulagram   ABDOMINAL AORTOGRAM W/LOWER EXTREMITY N/A 09/09/2017   Procedure: ABDOMINAL  AORTOGRAM W/LOWER EXTREMITY;  Surgeon: Angelia Mould, MD;  Location: [place] [facility];  Service: Cardiovascular;  Laterality: N/A;   ABDOMINAL AORTOGRAM W/LOWER EXTREMITY Right 11/11/2019   Procedure: ABDOMINAL AORTOGRAM W/LOWER EXTREMITY;  Surgeon: Marty Heck, MD;  Location: [place] [facility];  Service: Cardiovascular;  Laterality: Right;   ABDOMINAL AORTOGRAM W/LOWER EXTREMITY N/A 01/26/2022   Procedure: ABDOMINAL AORTOGRAM W/LOWER EXTREMITY;  Surgeon: Adrian Prows, MD;  Location: [place] [facility];  Service: Cardiovascular;  Laterality: N/A;   AMPUTATION Left 08/19/2013   Procedure: AMPUTATION RAY;  Surgeon: Alta Corning, MD;  Location: [facility];  Service: Orthopedics;  Laterality: Left;  ray amputation left 5th   AMPUTATION Left 10/27/2013   Procedure: AMPUTATION DIGIT-LEFT 4TH TOE, 4th and 5th metatarsal.;  Surgeon: Angelia Mould, MD;  Location: [place];  Service: Vascular;  Laterality: Left;   AMPUTATION Left 11/04/2013   Procedure: LEFT FOOT TRANS-METATARSAL AMPUTATION WITH WOUND CLOSURE ;  Surgeon: Alta Corning, MD;  Location: [place];  Service: Orthopedics;  Laterality: Left;   AMPUTATION Right 09/10/2017   Procedure: RIGHT FOURTH AND FIFTH TOE AMPUTATION;  Surgeon: Angelia Mould, MD;  Location: [place];  Service: Vascular;  Laterality: Right;   AMPUTATION Right 02/19/2020   Procedure: AMPUTATION RIGHT 3RD TOE;  Surgeon: Angelia Mould, MD;  Location: [place];  Service: Vascular;  Laterality: Right;   AMPUTATION Right 03/11/2020   Procedure: RIGHT BELOW KNEE AMPUTATION;  Surgeon: Newt Minion, MD;  Location: [place];  Service: Orthopedics;  Laterality: Right;   AMPUTATION Right 01/31/2022   Procedure: RIGHT ABOVE KNEE AMPUTATION;  Surgeon: Newt Minion, MD;  Location: [facility];  Service: Orthopedics;  Laterality: Right;   AMPUTATION Left 05/11/2022   Procedure: LEFT BELOW KNEE AMPUTATION;  Surgeon: Newt Minion, MD;  Location: [place];  Service:  Orthopedics;  Laterality: Left;   APPLICATION OF WOUND VAC Right 02/19/2020   Procedure: Application Of Wound Vac Right foot;  Surgeon: Angelia Mould, MD;  Location: [place];  Service: Vascular;  Laterality: Right;   APPLICATION OF WOUND VAC Left 05/11/2022   Procedure: APPLICATION OF WOUND VAC;  Surgeon: Newt Minion, MD;  Location: [place];  Service: Orthopedics;  Laterality: Left;   AV FISTULA PLACEMENT Right 03/27/2021   Procedure: RIGHT ARM ARTERIOVENOUS (AV) FISTULA;  Surgeon: Angelia Mould, MD;  Location: [place];  Service: Vascular;  Laterality: Right;   AV FISTULA PLACEMENT Right 05/10/2021   Procedure: RIGHT ARM ARTERIOVENOUS (AV) FISTULA;  Surgeon: Waynetta Sandy, MD;  Location: [place];  Service: Vascular;  Laterality: Right;   [place] Right 08/11/2021   Procedure: RIGHT ARM SECOND STAGE BASILIC VEIN TRANSPOSITION;  Surgeon: Waynetta Sandy, MD;  Location: [facility];  Service: Vascular;  Laterality: Right;   BIOPSY  07/20/2020   Procedure: BIOPSY;  Surgeon: Irving Copas., MD;  Location: [place];  Service: Gastroenterology;;   CARDIAC CATHETERIZATION  10/07/2014   Procedure: LEFT HEART CATH AND CORONARY ANGIOGRAPHY;  Surgeon: Laverda Page, MD;  Location: [facility] LAB;  Service: Cardiovascular;;   COLONOSCOPY W/ POLYPECTOMY     COLONOSCOPY WITH PROPOFOL N/A 05/25/2020   Procedure: COLONOSCOPY WITH PROPOFOL;  Surgeon: Gatha Mayer, MD;  Location: [facility];  Service: Endoscopy;  Laterality: N/A;   EMBOLECTOMY Right 09/12/2017   Procedure: Thrombectomy  & Redo Right Below Knee Popliteal Artery Bypass Graft  ;  Surgeon: Waynetta Sandy, MD;  Location: [facility];  Service: Vascular;  Laterality: Right;   ENTEROSCOPY N/A 07/20/2020   Procedure: ENTEROSCOPY;  Surgeon: Rush Landmark Telford Nab., MD;  Location: [place];  Service: Gastroenterology;  Laterality: N/A;   ESOPHAGOGASTRODUODENOSCOPY  (EGD) WITH PROPOFOL N/A 05/24/2020   Procedure: ESOPHAGOGASTRODUODENOSCOPY (EGD) WITH PROPOFOL;  Surgeon: Ladene Artist, MD;  Location: [facility];  Service: Endoscopy;  Laterality: N/A;   EYE SURGERY Bilateral    cataract   FEMORAL-TIBIAL BYPASS GRAFT Left 10/27/2013   Procedure: BYPASS GRAFT LEFT FEMORAL- POSTERIOR TIBIAL ARTERY;  Surgeon: Angelia Mould, MD;  Location: [place];  Service: Vascular;  Laterality: Left;   FEMORAL-TIBIAL BYPASS GRAFT Right 09/10/2017   Procedure: RIGHT SUPERFICIAL  FEMORAL ARTERY-BELOW KNEE POPLITEAL ARTERY BYPASS GRAFT WITH VEIN;  Surgeon: Angelia Mould, MD;  Location: [place];  Service: Vascular;  Laterality: Right;   GIVENS CAPSULE STUDY N/A 07/20/2020   Procedure: GIVENS CAPSULE STUDY;  Surgeon: Irving Copas., MD;  Location: [place];  Service: Gastroenterology;  Laterality: N/A;  capsule placed through scope into duodenum @ 0936   I & D EXTREMITY Left 10/27/2013   Procedure: IRRIGATION AND DEBRIDEMENT EXTREMITY- LEFT FOOT;  Surgeon: Angelia Mould, MD;  Location: [place];  Service: Vascular;  Laterality: Left;   I & D EXTREMITY Right 01/26/2020   Procedure: IRRIGATION AND DEBRIDEMENT EXTREMITY RIGHT FOOT WOUND;  Surgeon: Angelia Mould, MD;  Location: [place];  Service: Vascular;  Laterality: Right;   INSERT / REPLACE / REMOVE PACEMAKER     INSERTION OF DIALYSIS CATHETER Right 11/03/2021   Procedure: RIGHT INTERNAL JUGULAR INSERTION OF TUNNELED DIALYSIS CATHETER;  Surgeon: Angelia Mould, MD;  Location: [place];  Service: Vascular;  Laterality: Right;   INTRAOPERATIVE ARTERIOGRAM Right 09/10/2017   Procedure: INTRA OPERATIVE ARTERIOGRAM;  Surgeon: Angelia Mould, MD;  Location: [place];  Service: Vascular;  Laterality: Right;   IR ANGIOGRAM FOLLOW UP STUDY  09/09/2017   IR FLUORO GUIDE CV LINE RIGHT  05/31/2020   IR LUMBAR DISC ASPIRATION W/IMG GUIDE  01/19/2022   IR US GUIDE VASC ACCESS RIGHT  06/01/2020    LEFT HEART CATH AND CORONARY ANGIOGRAPHY N/A 08/19/2020   Procedure: LEFT HEART CATH AND CORONARY ANGIOGRAPHY;  Surgeon: Adrian Prows, MD;  Location: [place] [facility];  Service: Cardiovascular;  Laterality: N/A;   LEFT HEART CATHETERIZATION WITH CORONARY ANGIOGRAM N/A 11/09/2014   Procedure: LEFT HEART CATHETERIZATION WITH CORONARY ANGIOGRAM;  Surgeon: Laverda Page, MD;  Location: [facility] CATH LAB;  Service: Cardiovascular;  Laterality: N/A;   LOWER EXTREMITY ANGIOGRAM Right 11/08/2015   Procedure: Lower Extremity Angiogram;  Surgeon: Serafina Mitchell, MD;  Location: [place] [facility];  Service: Cardiovascular;  Laterality: Right;   LUMBAR LAMINECTOMY  06/13/2012   Procedure: MICRODISCECTOMY LUMBAR LAMINECTOMY;  Surgeon: Jessy Oto, MD;  Location: [place];  Service: Orthopedics;  Laterality: N/A;  Central laminectomy L2-3, L3-4, L4-5   PACEMAKER INSERTION  10/08/2014   MDT [employer]visa MRI compatible dual chamber pacemaker implanted by Dr Caryl Comes for syncope with post-termination pauses   PERCUTANEOUS CORONARY STENT INTERVENTION (PCI-S)  11/09/2014   des to lad & distal circumflex         Dr  [name]   PERIPHERAL VASCULAR BALLOON ANGIOPLASTY Right 11/11/2019   Procedure: PERIPHERAL VASCULAR BALLOON ANGIOPLASTY;  Surgeon: Marty Heck, MD;  Location: [place] [facility];  Service: Cardiovascular;  Laterality: Right;  below knee popliteal, tibioperoneal trunk, posterior tibial arteries   PERIPHERAL VASCULAR CATHETERIZATION N/A 11/08/2015   Procedure: Abdominal Aortogram;  Surgeon: Serafina Mitchell, MD;  Location: [place] [facility];  Service: Cardiovascular;  Laterality: N/A;   PERIPHERAL VASCULAR CATHETERIZATION Right 11/08/2015   Procedure: Peripheral Vascular Atherectomy;  Surgeon: Serafina Mitchell, MD;  Location: [place] [facility];  Service: Cardiovascular;  Laterality: Right;   PERIPHERAL VASCULAR CATHETERIZATION N/A 10/10/2016   Procedure: Lower Extremity Angiography;  Surgeon: Waynetta Sandy, MD;  Location: [place] [facility];  Service: Cardiovascular;  Laterality: N/A;   PERIPHERAL VASCULAR CATHETERIZATION Right 10/10/2016   Procedure: Peripheral Vascular Atherectomy;  Surgeon: Waynetta Sandy, MD;  Location: [place] [facility];  Service: Cardiovascular;  Laterality: Right;  Popliteal   PERMANENT PACEMAKER INSERTION N/A 10/08/2014   Procedure: PERMANENT PACEMAKER INSERTION;  Surgeon: Deboraha Sprang, MD;  Location: [facility] CATH  LAB;  Service: Cardiovascular;  Laterality: N/A;   RADIOLOGY WITH ANESTHESIA N/A 01/19/2022   Procedure: Disc Aspiration;  Surgeon: Pedro Earls, MD;  Location: [place];  Service: Radiology;  Laterality: N/A;   SUBMUCOSAL TATTOO INJECTION  07/20/2020   Procedure: SUBMUCOSAL TATTOO INJECTION;  Surgeon: Irving Copas., MD;  Location: [place];  Service: Gastroenterology;;   TEE WITHOUT CARDIOVERSION N/A 10/27/2021   Procedure: TRANSESOPHAGEAL ECHOCARDIOGRAM (TEE);  Surgeon: Adrian Prows, MD;  Location: [facility] ENDOSCOPY;  Service: Cardiovascular;  Laterality: N/A;   TEMPORARY PACEMAKER INSERTION Bilateral 10/07/2014   Procedure: TEMPORARY PACEMAKER INSERTION;  Surgeon: Laverda Page, MD;  Location: [facility] CATH LAB;  Service: Cardiovascular;  Laterality: Bilateral;   WOUND DEBRIDEMENT Right 02/19/2020   Procedure: DEBRIDEMENT WOUND RIGHT FOOT;  Surgeon: Angelia Mould, MD;  Location: [facility] OR;  Service: Vascular;  Laterality: Right;   Social History   Occupational History   Occupation: Disabled  Tobacco Use   Smoking status: Every Day    Types: Cigars    Passive exposure: Current   Smokeless tobacco: Never   Tobacco comments:    Smokes cigars daily, has not smoked cigarettes since 2014  Vaping Use   Vaping Use: Never used  Substance and Sexual Activity   Alcohol use: Not Currently    Alcohol/week: 21.0 standard drinks of alcohol    Types: 21 Glasses of wine per week    Comment: 1/2 to a whole beer once a  week as of 05/10/22   Drug use: No   Sexual activity: Not on file

## 2022-05-30 DIAGNOSIS — D631 Anemia in chronic kidney disease: Secondary | ICD-10-CM | POA: Diagnosis not present

## 2022-05-30 DIAGNOSIS — Z992 Dependence on renal dialysis: Secondary | ICD-10-CM | POA: Diagnosis not present

## 2022-05-30 DIAGNOSIS — D509 Iron deficiency anemia, unspecified: Secondary | ICD-10-CM | POA: Diagnosis not present

## 2022-05-30 DIAGNOSIS — K31811 Angiodysplasia of stomach and duodenum with bleeding: Secondary | ICD-10-CM | POA: Diagnosis not present

## 2022-05-30 DIAGNOSIS — N2581 Secondary hyperparathyroidism of renal origin: Secondary | ICD-10-CM | POA: Diagnosis not present

## 2022-05-30 DIAGNOSIS — N186 End stage renal disease: Secondary | ICD-10-CM | POA: Diagnosis not present

## 2022-05-31 ENCOUNTER — Other Ambulatory Visit: Payer: Self-pay | Admitting: Student

## 2022-05-31 DIAGNOSIS — I2510 Atherosclerotic heart disease of native coronary artery without angina pectoris: Secondary | ICD-10-CM

## 2022-05-31 DIAGNOSIS — I10 Essential (primary) hypertension: Secondary | ICD-10-CM

## 2022-06-01 ENCOUNTER — Ambulatory Visit: Payer: Medicare Other | Admitting: Cardiology

## 2022-06-01 DIAGNOSIS — K31811 Angiodysplasia of stomach and duodenum with bleeding: Secondary | ICD-10-CM | POA: Diagnosis not present

## 2022-06-01 DIAGNOSIS — D631 Anemia in chronic kidney disease: Secondary | ICD-10-CM | POA: Diagnosis not present

## 2022-06-01 DIAGNOSIS — Z992 Dependence on renal dialysis: Secondary | ICD-10-CM | POA: Diagnosis not present

## 2022-06-01 DIAGNOSIS — D509 Iron deficiency anemia, unspecified: Secondary | ICD-10-CM | POA: Diagnosis not present

## 2022-06-01 DIAGNOSIS — N186 End stage renal disease: Secondary | ICD-10-CM | POA: Diagnosis not present

## 2022-06-01 DIAGNOSIS — N2581 Secondary hyperparathyroidism of renal origin: Secondary | ICD-10-CM | POA: Diagnosis not present

## 2022-06-04 DIAGNOSIS — D631 Anemia in chronic kidney disease: Secondary | ICD-10-CM | POA: Diagnosis not present

## 2022-06-04 DIAGNOSIS — N2581 Secondary hyperparathyroidism of renal origin: Secondary | ICD-10-CM | POA: Diagnosis not present

## 2022-06-04 DIAGNOSIS — Z992 Dependence on renal dialysis: Secondary | ICD-10-CM | POA: Diagnosis not present

## 2022-06-04 DIAGNOSIS — K31811 Angiodysplasia of stomach and duodenum with bleeding: Secondary | ICD-10-CM | POA: Diagnosis not present

## 2022-06-04 DIAGNOSIS — D509 Iron deficiency anemia, unspecified: Secondary | ICD-10-CM | POA: Diagnosis not present

## 2022-06-04 DIAGNOSIS — N186 End stage renal disease: Secondary | ICD-10-CM | POA: Diagnosis not present

## 2022-06-06 DIAGNOSIS — D509 Iron deficiency anemia, unspecified: Secondary | ICD-10-CM | POA: Diagnosis not present

## 2022-06-06 DIAGNOSIS — Z992 Dependence on renal dialysis: Secondary | ICD-10-CM | POA: Diagnosis not present

## 2022-06-06 DIAGNOSIS — D631 Anemia in chronic kidney disease: Secondary | ICD-10-CM | POA: Diagnosis not present

## 2022-06-06 DIAGNOSIS — N186 End stage renal disease: Secondary | ICD-10-CM | POA: Diagnosis not present

## 2022-06-06 DIAGNOSIS — K31811 Angiodysplasia of stomach and duodenum with bleeding: Secondary | ICD-10-CM | POA: Diagnosis not present

## 2022-06-06 DIAGNOSIS — N2581 Secondary hyperparathyroidism of renal origin: Secondary | ICD-10-CM | POA: Diagnosis not present

## 2022-06-08 DIAGNOSIS — Z992 Dependence on renal dialysis: Secondary | ICD-10-CM | POA: Diagnosis not present

## 2022-06-08 DIAGNOSIS — D631 Anemia in chronic kidney disease: Secondary | ICD-10-CM | POA: Diagnosis not present

## 2022-06-08 DIAGNOSIS — K31811 Angiodysplasia of stomach and duodenum with bleeding: Secondary | ICD-10-CM | POA: Diagnosis not present

## 2022-06-08 DIAGNOSIS — D509 Iron deficiency anemia, unspecified: Secondary | ICD-10-CM | POA: Diagnosis not present

## 2022-06-08 DIAGNOSIS — N2581 Secondary hyperparathyroidism of renal origin: Secondary | ICD-10-CM | POA: Diagnosis not present

## 2022-06-08 DIAGNOSIS — N186 End stage renal disease: Secondary | ICD-10-CM | POA: Diagnosis not present

## 2022-06-11 DIAGNOSIS — D631 Anemia in chronic kidney disease: Secondary | ICD-10-CM | POA: Diagnosis not present

## 2022-06-11 DIAGNOSIS — E1122 Type 2 diabetes mellitus with diabetic chronic kidney disease: Secondary | ICD-10-CM | POA: Diagnosis not present

## 2022-06-11 DIAGNOSIS — N2581 Secondary hyperparathyroidism of renal origin: Secondary | ICD-10-CM | POA: Diagnosis not present

## 2022-06-11 DIAGNOSIS — D509 Iron deficiency anemia, unspecified: Secondary | ICD-10-CM | POA: Diagnosis not present

## 2022-06-11 DIAGNOSIS — K31811 Angiodysplasia of stomach and duodenum with bleeding: Secondary | ICD-10-CM | POA: Diagnosis not present

## 2022-06-11 DIAGNOSIS — Z992 Dependence on renal dialysis: Secondary | ICD-10-CM | POA: Diagnosis not present

## 2022-06-11 DIAGNOSIS — N186 End stage renal disease: Secondary | ICD-10-CM | POA: Diagnosis not present

## 2022-06-12 ENCOUNTER — Ambulatory Visit: Payer: Medicare Other | Admitting: Family

## 2022-06-13 DIAGNOSIS — D631 Anemia in chronic kidney disease: Secondary | ICD-10-CM | POA: Diagnosis not present

## 2022-06-13 DIAGNOSIS — N2581 Secondary hyperparathyroidism of renal origin: Secondary | ICD-10-CM | POA: Diagnosis not present

## 2022-06-13 DIAGNOSIS — Z992 Dependence on renal dialysis: Secondary | ICD-10-CM | POA: Diagnosis not present

## 2022-06-13 DIAGNOSIS — K31811 Angiodysplasia of stomach and duodenum with bleeding: Secondary | ICD-10-CM | POA: Diagnosis not present

## 2022-06-13 DIAGNOSIS — D509 Iron deficiency anemia, unspecified: Secondary | ICD-10-CM | POA: Diagnosis not present

## 2022-06-13 DIAGNOSIS — N186 End stage renal disease: Secondary | ICD-10-CM | POA: Diagnosis not present

## 2022-06-15 DIAGNOSIS — N2581 Secondary hyperparathyroidism of renal origin: Secondary | ICD-10-CM | POA: Diagnosis not present

## 2022-06-15 DIAGNOSIS — Z992 Dependence on renal dialysis: Secondary | ICD-10-CM | POA: Diagnosis not present

## 2022-06-15 DIAGNOSIS — D509 Iron deficiency anemia, unspecified: Secondary | ICD-10-CM | POA: Diagnosis not present

## 2022-06-15 DIAGNOSIS — K31811 Angiodysplasia of stomach and duodenum with bleeding: Secondary | ICD-10-CM | POA: Diagnosis not present

## 2022-06-15 DIAGNOSIS — D631 Anemia in chronic kidney disease: Secondary | ICD-10-CM | POA: Diagnosis not present

## 2022-06-15 DIAGNOSIS — N186 End stage renal disease: Secondary | ICD-10-CM | POA: Diagnosis not present

## 2022-06-18 DIAGNOSIS — K31811 Angiodysplasia of stomach and duodenum with bleeding: Secondary | ICD-10-CM | POA: Diagnosis not present

## 2022-06-18 DIAGNOSIS — D509 Iron deficiency anemia, unspecified: Secondary | ICD-10-CM | POA: Diagnosis not present

## 2022-06-18 DIAGNOSIS — D631 Anemia in chronic kidney disease: Secondary | ICD-10-CM | POA: Diagnosis not present

## 2022-06-18 DIAGNOSIS — N186 End stage renal disease: Secondary | ICD-10-CM | POA: Diagnosis not present

## 2022-06-18 DIAGNOSIS — N2581 Secondary hyperparathyroidism of renal origin: Secondary | ICD-10-CM | POA: Diagnosis not present

## 2022-06-18 DIAGNOSIS — Z992 Dependence on renal dialysis: Secondary | ICD-10-CM | POA: Diagnosis not present

## 2022-06-19 ENCOUNTER — Encounter: Payer: Self-pay | Admitting: Family

## 2022-06-19 ENCOUNTER — Ambulatory Visit (INDEPENDENT_AMBULATORY_CARE_PROVIDER_SITE_OTHER): Payer: Medicare Other | Admitting: Family

## 2022-06-19 DIAGNOSIS — Z89512 Acquired absence of left leg below knee: Secondary | ICD-10-CM

## 2022-06-19 DIAGNOSIS — S88112A Complete traumatic amputation at level between knee and ankle, left lower leg, initial encounter: Secondary | ICD-10-CM

## 2022-06-19 MED ORDER — OXYCODONE-ACETAMINOPHEN 5-325 MG PO TABS: 1.0000 | ORAL_TABLET | Freq: Three times a day (TID) | ORAL | 0 refills | Status: DC | PRN

## 2022-06-19 NOTE — Addendum Note (Signed)
Addended by: Suzan Slick on: 06/19/2022 08:58 AM   Modules accepted: Orders

## 2022-06-19 NOTE — Progress Notes (Signed)
Post-Op Visit Note   Patient: Alexander [name]           Date of Birth: Sep 03, 1961           MRN: [id number] Visit Date: 06/19/2022 PCP: [name], MD  Chief Complaint:  Chief Complaint  Patient presents with   Left Leg - Routine Post Op    05/11/22 left BKA    HPI:  HPI The patient is a 61 year old gentleman seen status post left below-knee amputation he has been doing dry dressing changes and wearing his shrinker  Ortho Exam Staples harvested today there is no gaping there is minimal bloody drainage no erythema no warmth  Visit Diagnoses:  1. Below-knee amputation of left lower extremity (HCC)     Plan: Continue daily Dial soap cleansing.  Shrinker around-the-clock.  Discussed prosthesis set up he is going to be holding off at this time due to cost.  Follow-Up Instructions: No follow-ups on file.   Imaging: No results found.  Orders:  No orders of the defined types were placed in this encounter.  No orders of the defined types were placed in this encounter.    PMFS History: Patient Active Problem List   Diagnosis Date Noted   Gangrene ([place]) 05/11/2022   Below-knee amputation of left lower extremity ([place]) 05/11/2022   History of right above knee amputation ([place]) 05/10/2022   Goals of care, counseling/discussion    Palliative care by specialist    Dehiscence of amputation stump of right lower extremity ([place])    Endocarditis 01/21/2022   Ulcer of right leg, limited to breakdown of skin ([place])    Lumbar back pain with radiculopathy affecting left lower extremity 01/14/2022   Dyslipidemia 01/14/2022   Type 2 diabetes mellitus with chronic kidney disease, with long-term current use of insulin ([place]) 01/14/2022   GERD without esophagitis 01/14/2022   Pressure injury of skin 01/14/2022   Intractable back pain 01/13/2022   Blister 11/08/2021   Severe protein-calorie malnutrition ([place]) 11/02/2021   Pacemaker infection ([place])    Transaminitis    Tricuspid valve  vegetation    Thrombocytopenia ([place]) 10/26/2021   Hyponatremia 10/26/2021   Acute on chronic diastolic CHF (congestive heart failure) ([place]) 10/26/2021   Atrial fibrillation with RVR ([place]) 10/25/2021   Symptomatic anemia 09/30/2021   ESRD on dialysis ([place]) 09/30/2021   PAF (paroxysmal atrial fibrillation) ([place]) 09/30/2021   NSVT (nonsustained ventricular tachycardia) (HCC)    Pancytopenia ([place])    Acute blood loss anemia 07/18/2020   Hemodialysis patient ([place]) 06/04/2020   Necrotizing glomerulonephritis 06/04/2020   Heme positive stool    Melena    Acute GI bleeding 05/23/2020   Acute hematogenous osteomyelitis of right foot ([place]) 03/11/2020   Diabetic foot ulcer associated with type 2 diabetes mellitus ([place]) 02/19/2020   Heel ulcer ([place]) 11/05/2019   Gangrene of extremity due to atherosclerosis of artery ([place]) 09/09/2017   Essential hypertension, benign 02/07/2015   Renal insufficiency 02/07/2015   Post PTCA 11/09/2014   S/P PTCA (percutaneous transluminal coronary angioplasty) 11/09/2014   Pacemaker: [place] DR MRI [id number] Dual chamber pacemaker 10/08/2014 10/08/2014   Encounter for care of pacemaker 10/08/2014   Sinus node dysfunction ([place]) 10/08/2014   Cardiac asystole ([place]) 10/07/2014   Cardiac syncope 10/07/2014   Diabetes mellitus with stage 3 chronic kidney disease ([place]) 10/07/2014   CAD (coronary artery disease), native coronary artery 10/07/2014   Diabetes mellitus due to underlying condition without complications ([place]) 35/57/3220   Anemia, unspecified  05/25/2014   Syncope 04/30/2014   Atherosclerosis of native arteries of the extremities with gangrene ([place]) 12/16/2013   Acute renal failure ([place]) 10/28/2013   PVD (peripheral vascular disease) ([place]) 10/27/2013   Leukocytosis 10/21/2013   Anemia 10/21/2013   Spinal stenosis, lumbar region, with neurogenic claudication 06/12/2012    Class: Diagnosis of   Diabetes mellitus with end stage renal disease ([place])  03/15/2009   ALCOHOL ABUSE 03/15/2009   TOBACCO USE 03/15/2009   HYPERTENSION 03/15/2009   Past Medical History:  Diagnosis Date   Acute blood loss anemia    Alcohol abuse    Anemia    Anemia of chronic disease    Arthritis    "patient does not think so."   Atrial fibrillation ([place])    Cardiac syncope 10/07/2014   rapid A fib with 8 sec pauses on converison with syncope- temp pacing wire placed then PPM   Cataract    BILATERAL   Coronary artery disease    with stents   Diabetes mellitus    dx---been  awhile   Elevated LFTs    Endocarditis of tricuspid valve    ESRD (end stage renal disease) ([place])    M/W/F   Gangrene of right foot (HCC)    GERD (gastroesophageal reflux disease)    History of blood transfusion    Hyperlipemia    Hypertension    Malnutrition ([place])    Osteomyelitis (HCC)    right foot   Osteomyelitis of left foot ([place]) 08/19/2013   Peripheral vascular disease ([place])    Pneumonia    Presence of permanent cardiac pacemaker 10/08/2014   Medtronic    Family History  Problem Relation Age of Onset   Diabetes type II Mother    Hypertension Mother    Diabetes Mother    Liver cancer Father    Diabetes type II Sister    Breast cancer Sister    Diabetes Sister    Hypertension Sister    Diabetes type II Brother    Kidney failure Brother    Diabetes Brother    Hypertension Brother    Diabetes type II Sister     Past Surgical History:  Procedure Laterality Date   A/V FISTULAGRAM Right 05/01/2021   Procedure: A/V FISTULAGRAM;  Surgeon: Waynetta Sandy, MD;  Location: [facility];  Service: Cardiovascular;  Laterality: Right;  failed fistulagram   ABDOMINAL AORTOGRAM W/LOWER EXTREMITY N/A 09/09/2017   Procedure: ABDOMINAL AORTOGRAM W/LOWER EXTREMITY;  Surgeon: Angelia Mould, MD;  Location: [facility];  Service: Cardiovascular;  Laterality: N/A;   ABDOMINAL AORTOGRAM W/LOWER EXTREMITY Right 11/11/2019   Procedure: ABDOMINAL  AORTOGRAM W/LOWER EXTREMITY;  Surgeon: Marty Heck, MD;  Location: [facility];  Service: Cardiovascular;  Laterality: Right;   ABDOMINAL AORTOGRAM W/LOWER EXTREMITY N/A 01/26/2022   Procedure: ABDOMINAL AORTOGRAM W/LOWER EXTREMITY;  Surgeon: Adrian Prows, MD;  Location: [facility];  Service: Cardiovascular;  Laterality: N/A;   AMPUTATION Left 08/19/2013   Procedure: AMPUTATION RAY;  Surgeon: Alta Corning, MD;  Location: [facility];  Service: Orthopedics;  Laterality: Left;  ray amputation left 5th   AMPUTATION Left 10/27/2013   Procedure: AMPUTATION DIGIT-LEFT 4TH TOE, 4th and 5th metatarsal.;  Surgeon: Angelia Mould, MD;  Location: [facility];  Service: Vascular;  Laterality: Left;   AMPUTATION Left 11/04/2013   Procedure: LEFT FOOT TRANS-METATARSAL AMPUTATION WITH WOUND CLOSURE ;  Surgeon: Alta Corning, MD;  Location: [facility];  Service: Orthopedics;  Laterality: Left;   AMPUTATION Right 09/10/2017   Procedure: RIGHT FOURTH AND FIFTH TOE AMPUTATION;  Surgeon: Angelia Mould, MD;  Location: [facility];  Service: Vascular;  Laterality: Right;   AMPUTATION Right 02/19/2020   Procedure: AMPUTATION RIGHT 3RD TOE;  Surgeon: Angelia Mould, MD;  Location: [place];  Service: Vascular;  Laterality: Right;   AMPUTATION Right 03/11/2020   Procedure: RIGHT BELOW KNEE AMPUTATION;  Surgeon: Newt Minion, MD;  Location: [facility];  Service: Orthopedics;  Laterality: Right;   AMPUTATION Right 01/31/2022   Procedure: RIGHT ABOVE KNEE AMPUTATION;  Surgeon: Newt Minion, MD;  Location: [facility];  Service: Orthopedics;  Laterality: Right;   AMPUTATION Left 05/11/2022   Procedure: LEFT BELOW KNEE AMPUTATION;  Surgeon: Newt Minion, MD;  Location: [facility];  Service: Orthopedics;  Laterality: Left;   APPLICATION OF WOUND VAC Right 02/19/2020   Procedure: Application Of Wound Vac Right foot;  Surgeon: Angelia Mould, MD;  Location: [facility];  Service: Vascular;  Laterality: Right;    APPLICATION OF WOUND VAC Left 05/11/2022   Procedure: APPLICATION OF WOUND VAC;  Surgeon: Newt Minion, MD;  Location: [facility];  Service: Orthopedics;  Laterality: Left;   AV FISTULA PLACEMENT Right 03/27/2021   Procedure: RIGHT ARM ARTERIOVENOUS (AV) FISTULA;  Surgeon: Angelia Mould, MD;  Location: [facility] [place];  Service: Vascular;  Laterality: Right;   AV FISTULA PLACEMENT Right 05/10/2021   Procedure: RIGHT ARM ARTERIOVENOUS (AV) FISTULA;  Surgeon: Waynetta Sandy, MD;  Location: [facility];  Service: Vascular;  Laterality: Right;   [place] Right 08/11/2021   Procedure: RIGHT ARM SECOND STAGE BASILIC VEIN TRANSPOSITION;  Surgeon: Waynetta Sandy, MD;  Location: [facility];  Service: Vascular;  Laterality: Right;   BIOPSY  07/20/2020   Procedure: BIOPSY;  Surgeon: Irving Copas., MD;  Location: [facility];  Service: Gastroenterology;;   CARDIAC CATHETERIZATION  10/07/2014   Procedure: LEFT HEART CATH AND CORONARY ANGIOGRAPHY;  Surgeon: Laverda Page, MD;  [facility];  Service: Cardiovascular;;   COLONOSCOPY W/ POLYPECTOMY     COLONOSCOPY WITH PROPOFOL N/A 05/25/2020   Procedure: COLONOSCOPY WITH PROPOFOL;  Surgeon: Gatha Mayer, MD;  Location: [facility];  Service: Endoscopy;  Laterality: N/A;   EMBOLECTOMY Right 09/12/2017   Procedure: Thrombectomy  & Redo Right Below Knee Popliteal Artery Bypass Graft  ;  Surgeon: Waynetta Sandy, MD;  Location: [facility] OR;  Service: Vascular;  Laterality: Right;   ENTEROSCOPY N/A 07/20/2020   Procedure: ENTEROSCOPY;  Surgeon: Rush Landmark Telford Nab., MD;  Location: [facility];  Service: Gastroenterology;  Laterality: N/A;   ESOPHAGOGASTRODUODENOSCOPY (EGD) WITH PROPOFOL N/A 05/24/2020   Procedure: ESOPHAGOGASTRODUODENOSCOPY (EGD) WITH PROPOFOL;  Surgeon: Ladene Artist, MD;  [facility];  Service: Endoscopy;  Laterality: N/A;   EYE SURGERY Bilateral     cataract   FEMORAL-TIBIAL BYPASS GRAFT Left 10/27/2013   Procedure: BYPASS GRAFT LEFT FEMORAL- POSTERIOR TIBIAL ARTERY;  Surgeon: Angelia Mould, MD;  Location: [facility];  Service: Vascular;  Laterality: Left;   FEMORAL-TIBIAL BYPASS GRAFT Right 09/10/2017   Procedure: RIGHT SUPERFICIAL  FEMORAL ARTERY-BELOW KNEE POPLITEAL ARTERY BYPASS GRAFT WITH VEIN;  Surgeon: Angelia Mould, MD;  Location: [place];  Service: Vascular;  Laterality: Right;   GIVENS CAPSULE STUDY N/A 07/20/2020   Procedure: GIVENS CAPSULE STUDY;  Surgeon: Irving Copas., MD;  Location: [facility];  Service: Gastroenterology;  Laterality: N/A;  capsule placed through scope into duodenum @ 0936   I & D EXTREMITY Left 10/27/2013   Procedure: IRRIGATION AND DEBRIDEMENT EXTREMITY- LEFT FOOT;  Surgeon: Angelia Mould, MD;  Location: [facility];  Service: Vascular;  Laterality: Left;   I & D EXTREMITY Right 01/26/2020   Procedure: IRRIGATION AND DEBRIDEMENT EXTREMITY RIGHT FOOT WOUND;  Surgeon: Angelia Mould, MD;  Location: [facility];  Service: Vascular;  Laterality: Right;   INSERT / REPLACE / REMOVE PACEMAKER     INSERTION OF DIALYSIS CATHETER Right 11/03/2021   Procedure: RIGHT INTERNAL JUGULAR INSERTION OF TUNNELED DIALYSIS CATHETER;  Surgeon: Angelia Mould, MD;  Location: [place];  Service: Vascular;  Laterality: Right;   INTRAOPERATIVE ARTERIOGRAM Right 09/10/2017   Procedure: INTRA OPERATIVE ARTERIOGRAM;  Surgeon: Angelia Mould, MD;  Location: [place];  Service: Vascular;  Laterality: Right;   IR ANGIOGRAM FOLLOW UP STUDY  09/09/2017   IR FLUORO GUIDE CV LINE RIGHT  05/31/2020   IR LUMBAR DISC ASPIRATION W/IMG GUIDE  01/19/2022   IR US GUIDE VASC ACCESS RIGHT  06/01/2020   LEFT HEART CATH AND CORONARY ANGIOGRAPHY N/A 08/19/2020   Procedure: LEFT HEART CATH AND CORONARY ANGIOGRAPHY;  Surgeon: Adrian Prows, MD;  Location: [facility];  Service: Cardiovascular;  Laterality: N/A;    LEFT HEART CATHETERIZATION WITH CORONARY ANGIOGRAM N/A 11/09/2014   Procedure: LEFT HEART CATHETERIZATION WITH CORONARY ANGIOGRAM;  Surgeon: Laverda Page, MD;  Location: [facility] CATH LAB;  Service: Cardiovascular;  Laterality: N/A;   LOWER EXTREMITY ANGIOGRAM Right 11/08/2015   Procedure: Lower Extremity Angiogram;  Surgeon: Serafina Mitchell, MD;  Location: [place] [facility];  Service: Cardiovascular;  Laterality: Right;   LUMBAR LAMINECTOMY  06/13/2012   Procedure: MICRODISCECTOMY LUMBAR LAMINECTOMY;  Surgeon: Jessy Oto, MD;  Location: [facility];  Service: Orthopedics;  Laterality: N/A;  Central laminectomy L2-3, L3-4, L4-5   PACEMAKER INSERTION  10/08/2014   MDT Advisa MRI compatible dual chamber pacemaker implanted by Dr Caryl Comes for syncope with post-termination pauses   PERCUTANEOUS CORONARY STENT INTERVENTION (PCI-S)  11/09/2014   des to lad & distal circumflex         Dr  [name]   PERIPHERAL VASCULAR BALLOON ANGIOPLASTY Right 11/11/2019   Procedure: PERIPHERAL VASCULAR BALLOON ANGIOPLASTY;  Surgeon: Marty Heck, MD;  Location: [facility];  Service: Cardiovascular;  Laterality: Right;  below knee popliteal, tibioperoneal trunk, posterior tibial arteries   PERIPHERAL VASCULAR CATHETERIZATION N/A 11/08/2015   Procedure: Abdominal Aortogram;  Surgeon: Serafina Mitchell, MD;  Location: [facility];  Service: Cardiovascular;  Laterality: N/A;   PERIPHERAL VASCULAR CATHETERIZATION Right 11/08/2015   Procedure: Peripheral Vascular Atherectomy;  Surgeon: Serafina Mitchell, MD;  Location: [facility];  Service: Cardiovascular;  Laterality: Right;   PERIPHERAL VASCULAR CATHETERIZATION N/A 10/10/2016   Procedure: Lower Extremity Angiography;  Surgeon: Waynetta Sandy, MD;  Location: [facility];  Service: Cardiovascular;  Laterality: N/A;   PERIPHERAL VASCULAR CATHETERIZATION Right 10/10/2016   Procedure: Peripheral Vascular Atherectomy;  Surgeon: Waynetta Sandy, MD;  Location: [facility];  Service: Cardiovascular;  Laterality: Right;  Popliteal   PERMANENT PACEMAKER INSERTION N/A 10/08/2014   Procedure: PERMANENT PACEMAKER INSERTION;  Surgeon: Deboraha Sprang, MD;  Location: [facility] CATH LAB;  Service: Cardiovascular;  Laterality: N/A;   RADIOLOGY WITH ANESTHESIA N/A 01/19/2022   Procedure: Disc Aspiration;  Surgeon: Pedro Earls, MD;  Location: [place];  Service:  Radiology;  Laterality: N/A;   SUBMUCOSAL TATTOO INJECTION  07/20/2020   Procedure: SUBMUCOSAL TATTOO INJECTION;  Surgeon: Irving Copas., MD;  Location: [place];  Service: Gastroenterology;;   TEE WITHOUT CARDIOVERSION N/A 10/27/2021   Procedure: TRANSESOPHAGEAL ECHOCARDIOGRAM (TEE);  Surgeon: Adrian Prows, MD;  Location[facility] ENDOSCOPY;  Service: Cardiovascular;  Laterality: N/A;   TEMPORARY PACEMAKER INSERTION Bilateral 10/07/2014   Procedure: TEMPORARY PACEMAKER INSERTION;  Surgeon: Laverda Page, MD;  Location: [facility] CATH LAB;  Service: Cardiovascular;  Laterality: Bilateral;   WOUND DEBRIDEMENT Right 02/19/2020   Procedure: DEBRIDEMENT WOUND RIGHT FOOT;  Surgeon: Angelia Mould, MD;  Location[facility] OR;  Service: Vascular;  Laterality: Right;   Social History   Occupational History   Occupation: Disabled  Tobacco Use   Smoking status: Every Day    Types: Cigars    Passive exposure: Current   Smokeless tobacco: Never   Tobacco comments:    Smokes cigars daily, has not smoked cigarettes since 2014  Vaping Use   Vaping Use: Never used  Substance and Sexual Activity   Alcohol use: Not Currently    Alcohol/week: 21.0 standard drinks of alcohol    Types: 21 Glasses of wine per week    Comment: 1/2 to a whole beer once a week as of 05/10/22   Drug use: No   Sexual activity: Not on file

## 2022-06-20 DIAGNOSIS — K31811 Angiodysplasia of stomach and duodenum with bleeding: Secondary | ICD-10-CM | POA: Diagnosis not present

## 2022-06-20 DIAGNOSIS — N186 End stage renal disease: Secondary | ICD-10-CM | POA: Diagnosis not present

## 2022-06-20 DIAGNOSIS — N2581 Secondary hyperparathyroidism of renal origin: Secondary | ICD-10-CM | POA: Diagnosis not present

## 2022-06-20 DIAGNOSIS — D631 Anemia in chronic kidney disease: Secondary | ICD-10-CM | POA: Diagnosis not present

## 2022-06-20 DIAGNOSIS — Z992 Dependence on renal dialysis: Secondary | ICD-10-CM | POA: Diagnosis not present

## 2022-06-20 DIAGNOSIS — D509 Iron deficiency anemia, unspecified: Secondary | ICD-10-CM | POA: Diagnosis not present

## 2022-06-22 DIAGNOSIS — D509 Iron deficiency anemia, unspecified: Secondary | ICD-10-CM | POA: Diagnosis not present

## 2022-06-22 DIAGNOSIS — N186 End stage renal disease: Secondary | ICD-10-CM | POA: Diagnosis not present

## 2022-06-22 DIAGNOSIS — N2581 Secondary hyperparathyroidism of renal origin: Secondary | ICD-10-CM | POA: Diagnosis not present

## 2022-06-22 DIAGNOSIS — Z992 Dependence on renal dialysis: Secondary | ICD-10-CM | POA: Diagnosis not present

## 2022-06-22 DIAGNOSIS — K31811 Angiodysplasia of stomach and duodenum with bleeding: Secondary | ICD-10-CM | POA: Diagnosis not present

## 2022-06-22 DIAGNOSIS — D631 Anemia in chronic kidney disease: Secondary | ICD-10-CM | POA: Diagnosis not present

## 2022-06-24 DIAGNOSIS — I5022 Chronic systolic (congestive) heart failure: Secondary | ICD-10-CM | POA: Diagnosis not present

## 2022-06-25 DIAGNOSIS — D631 Anemia in chronic kidney disease: Secondary | ICD-10-CM | POA: Diagnosis not present

## 2022-06-25 DIAGNOSIS — D509 Iron deficiency anemia, unspecified: Secondary | ICD-10-CM | POA: Diagnosis not present

## 2022-06-25 DIAGNOSIS — N186 End stage renal disease: Secondary | ICD-10-CM | POA: Diagnosis not present

## 2022-06-25 DIAGNOSIS — N2581 Secondary hyperparathyroidism of renal origin: Secondary | ICD-10-CM | POA: Diagnosis not present

## 2022-06-25 DIAGNOSIS — Z992 Dependence on renal dialysis: Secondary | ICD-10-CM | POA: Diagnosis not present

## 2022-06-25 DIAGNOSIS — K31811 Angiodysplasia of stomach and duodenum with bleeding: Secondary | ICD-10-CM | POA: Diagnosis not present

## 2022-06-27 DIAGNOSIS — K31811 Angiodysplasia of stomach and duodenum with bleeding: Secondary | ICD-10-CM | POA: Diagnosis not present

## 2022-06-27 DIAGNOSIS — D509 Iron deficiency anemia, unspecified: Secondary | ICD-10-CM | POA: Diagnosis not present

## 2022-06-27 DIAGNOSIS — Z992 Dependence on renal dialysis: Secondary | ICD-10-CM | POA: Diagnosis not present

## 2022-06-27 DIAGNOSIS — N186 End stage renal disease: Secondary | ICD-10-CM | POA: Diagnosis not present

## 2022-06-27 DIAGNOSIS — D631 Anemia in chronic kidney disease: Secondary | ICD-10-CM | POA: Diagnosis not present

## 2022-06-27 DIAGNOSIS — N2581 Secondary hyperparathyroidism of renal origin: Secondary | ICD-10-CM | POA: Diagnosis not present

## 2022-06-28 DIAGNOSIS — I4891 Unspecified atrial fibrillation: Secondary | ICD-10-CM | POA: Diagnosis not present

## 2022-06-28 DIAGNOSIS — I2510 Atherosclerotic heart disease of native coronary artery without angina pectoris: Secondary | ICD-10-CM | POA: Diagnosis not present

## 2022-06-28 DIAGNOSIS — N186 End stage renal disease: Secondary | ICD-10-CM | POA: Diagnosis not present

## 2022-06-29 DIAGNOSIS — N2581 Secondary hyperparathyroidism of renal origin: Secondary | ICD-10-CM | POA: Diagnosis not present

## 2022-06-29 DIAGNOSIS — K31811 Angiodysplasia of stomach and duodenum with bleeding: Secondary | ICD-10-CM | POA: Diagnosis not present

## 2022-06-29 DIAGNOSIS — D509 Iron deficiency anemia, unspecified: Secondary | ICD-10-CM | POA: Diagnosis not present

## 2022-06-29 DIAGNOSIS — N186 End stage renal disease: Secondary | ICD-10-CM | POA: Diagnosis not present

## 2022-06-29 DIAGNOSIS — D631 Anemia in chronic kidney disease: Secondary | ICD-10-CM | POA: Diagnosis not present

## 2022-06-29 DIAGNOSIS — Z992 Dependence on renal dialysis: Secondary | ICD-10-CM | POA: Diagnosis not present

## 2022-07-02 DIAGNOSIS — D509 Iron deficiency anemia, unspecified: Secondary | ICD-10-CM | POA: Diagnosis not present

## 2022-07-02 DIAGNOSIS — N186 End stage renal disease: Secondary | ICD-10-CM | POA: Diagnosis not present

## 2022-07-02 DIAGNOSIS — K31811 Angiodysplasia of stomach and duodenum with bleeding: Secondary | ICD-10-CM | POA: Diagnosis not present

## 2022-07-02 DIAGNOSIS — N2581 Secondary hyperparathyroidism of renal origin: Secondary | ICD-10-CM | POA: Diagnosis not present

## 2022-07-02 DIAGNOSIS — Z992 Dependence on renal dialysis: Secondary | ICD-10-CM | POA: Diagnosis not present

## 2022-07-02 DIAGNOSIS — D631 Anemia in chronic kidney disease: Secondary | ICD-10-CM | POA: Diagnosis not present

## 2022-07-04 DIAGNOSIS — N186 End stage renal disease: Secondary | ICD-10-CM | POA: Diagnosis not present

## 2022-07-04 DIAGNOSIS — K31811 Angiodysplasia of stomach and duodenum with bleeding: Secondary | ICD-10-CM | POA: Diagnosis not present

## 2022-07-04 DIAGNOSIS — N2581 Secondary hyperparathyroidism of renal origin: Secondary | ICD-10-CM | POA: Diagnosis not present

## 2022-07-04 DIAGNOSIS — Z992 Dependence on renal dialysis: Secondary | ICD-10-CM | POA: Diagnosis not present

## 2022-07-04 DIAGNOSIS — D509 Iron deficiency anemia, unspecified: Secondary | ICD-10-CM | POA: Diagnosis not present

## 2022-07-04 DIAGNOSIS — D631 Anemia in chronic kidney disease: Secondary | ICD-10-CM | POA: Diagnosis not present

## 2022-07-06 DIAGNOSIS — N186 End stage renal disease: Secondary | ICD-10-CM | POA: Diagnosis not present

## 2022-07-06 DIAGNOSIS — K31811 Angiodysplasia of stomach and duodenum with bleeding: Secondary | ICD-10-CM | POA: Diagnosis not present

## 2022-07-06 DIAGNOSIS — D509 Iron deficiency anemia, unspecified: Secondary | ICD-10-CM | POA: Diagnosis not present

## 2022-07-06 DIAGNOSIS — N2581 Secondary hyperparathyroidism of renal origin: Secondary | ICD-10-CM | POA: Diagnosis not present

## 2022-07-06 DIAGNOSIS — Z992 Dependence on renal dialysis: Secondary | ICD-10-CM | POA: Diagnosis not present

## 2022-07-06 DIAGNOSIS — D631 Anemia in chronic kidney disease: Secondary | ICD-10-CM | POA: Diagnosis not present

## 2022-07-09 DIAGNOSIS — N186 End stage renal disease: Secondary | ICD-10-CM | POA: Diagnosis not present

## 2022-07-09 DIAGNOSIS — N2581 Secondary hyperparathyroidism of renal origin: Secondary | ICD-10-CM | POA: Diagnosis not present

## 2022-07-09 DIAGNOSIS — Z992 Dependence on renal dialysis: Secondary | ICD-10-CM | POA: Diagnosis not present

## 2022-07-09 DIAGNOSIS — D509 Iron deficiency anemia, unspecified: Secondary | ICD-10-CM | POA: Diagnosis not present

## 2022-07-09 DIAGNOSIS — D631 Anemia in chronic kidney disease: Secondary | ICD-10-CM | POA: Diagnosis not present

## 2022-07-09 DIAGNOSIS — K31811 Angiodysplasia of stomach and duodenum with bleeding: Secondary | ICD-10-CM | POA: Diagnosis not present

## 2022-07-11 DIAGNOSIS — K31811 Angiodysplasia of stomach and duodenum with bleeding: Secondary | ICD-10-CM | POA: Diagnosis not present

## 2022-07-11 DIAGNOSIS — Z992 Dependence on renal dialysis: Secondary | ICD-10-CM | POA: Diagnosis not present

## 2022-07-11 DIAGNOSIS — N186 End stage renal disease: Secondary | ICD-10-CM | POA: Diagnosis not present

## 2022-07-11 DIAGNOSIS — D631 Anemia in chronic kidney disease: Secondary | ICD-10-CM | POA: Diagnosis not present

## 2022-07-11 DIAGNOSIS — N2581 Secondary hyperparathyroidism of renal origin: Secondary | ICD-10-CM | POA: Diagnosis not present

## 2022-07-11 DIAGNOSIS — D509 Iron deficiency anemia, unspecified: Secondary | ICD-10-CM | POA: Diagnosis not present

## 2022-07-12 ENCOUNTER — Ambulatory Visit: Payer: Medicare Other | Admitting: Cardiology

## 2022-07-12 DIAGNOSIS — I4891 Unspecified atrial fibrillation: Secondary | ICD-10-CM | POA: Diagnosis not present

## 2022-07-12 DIAGNOSIS — M1990 Unspecified osteoarthritis, unspecified site: Secondary | ICD-10-CM | POA: Diagnosis not present

## 2022-07-12 DIAGNOSIS — N186 End stage renal disease: Secondary | ICD-10-CM | POA: Diagnosis not present

## 2022-07-12 DIAGNOSIS — Z992 Dependence on renal dialysis: Secondary | ICD-10-CM | POA: Diagnosis not present

## 2022-07-12 DIAGNOSIS — E1142 Type 2 diabetes mellitus with diabetic polyneuropathy: Secondary | ICD-10-CM | POA: Diagnosis not present

## 2022-07-12 DIAGNOSIS — I509 Heart failure, unspecified: Secondary | ICD-10-CM | POA: Diagnosis not present

## 2022-07-12 DIAGNOSIS — E1122 Type 2 diabetes mellitus with diabetic chronic kidney disease: Secondary | ICD-10-CM | POA: Diagnosis not present

## 2022-07-13 DIAGNOSIS — D509 Iron deficiency anemia, unspecified: Secondary | ICD-10-CM | POA: Diagnosis not present

## 2022-07-13 DIAGNOSIS — K31811 Angiodysplasia of stomach and duodenum with bleeding: Secondary | ICD-10-CM | POA: Diagnosis not present

## 2022-07-13 DIAGNOSIS — N2581 Secondary hyperparathyroidism of renal origin: Secondary | ICD-10-CM | POA: Diagnosis not present

## 2022-07-13 DIAGNOSIS — N186 End stage renal disease: Secondary | ICD-10-CM | POA: Diagnosis not present

## 2022-07-13 DIAGNOSIS — Z992 Dependence on renal dialysis: Secondary | ICD-10-CM | POA: Diagnosis not present

## 2022-07-13 DIAGNOSIS — D631 Anemia in chronic kidney disease: Secondary | ICD-10-CM | POA: Diagnosis not present

## 2022-07-16 DIAGNOSIS — D509 Iron deficiency anemia, unspecified: Secondary | ICD-10-CM | POA: Diagnosis not present

## 2022-07-16 DIAGNOSIS — N2581 Secondary hyperparathyroidism of renal origin: Secondary | ICD-10-CM | POA: Diagnosis not present

## 2022-07-16 DIAGNOSIS — N186 End stage renal disease: Secondary | ICD-10-CM | POA: Diagnosis not present

## 2022-07-16 DIAGNOSIS — Z992 Dependence on renal dialysis: Secondary | ICD-10-CM | POA: Diagnosis not present

## 2022-07-16 DIAGNOSIS — K31811 Angiodysplasia of stomach and duodenum with bleeding: Secondary | ICD-10-CM | POA: Diagnosis not present

## 2022-07-16 DIAGNOSIS — D631 Anemia in chronic kidney disease: Secondary | ICD-10-CM | POA: Diagnosis not present

## 2022-07-18 DIAGNOSIS — Z992 Dependence on renal dialysis: Secondary | ICD-10-CM | POA: Diagnosis not present

## 2022-07-18 DIAGNOSIS — N2581 Secondary hyperparathyroidism of renal origin: Secondary | ICD-10-CM | POA: Diagnosis not present

## 2022-07-18 DIAGNOSIS — N186 End stage renal disease: Secondary | ICD-10-CM | POA: Diagnosis not present

## 2022-07-18 DIAGNOSIS — D509 Iron deficiency anemia, unspecified: Secondary | ICD-10-CM | POA: Diagnosis not present

## 2022-07-18 DIAGNOSIS — K31811 Angiodysplasia of stomach and duodenum with bleeding: Secondary | ICD-10-CM | POA: Diagnosis not present

## 2022-07-18 DIAGNOSIS — D631 Anemia in chronic kidney disease: Secondary | ICD-10-CM | POA: Diagnosis not present

## 2022-07-20 DIAGNOSIS — D509 Iron deficiency anemia, unspecified: Secondary | ICD-10-CM | POA: Diagnosis not present

## 2022-07-20 DIAGNOSIS — N186 End stage renal disease: Secondary | ICD-10-CM | POA: Diagnosis not present

## 2022-07-20 DIAGNOSIS — K31811 Angiodysplasia of stomach and duodenum with bleeding: Secondary | ICD-10-CM | POA: Diagnosis not present

## 2022-07-20 DIAGNOSIS — Z992 Dependence on renal dialysis: Secondary | ICD-10-CM | POA: Diagnosis not present

## 2022-07-20 DIAGNOSIS — D631 Anemia in chronic kidney disease: Secondary | ICD-10-CM | POA: Diagnosis not present

## 2022-07-20 DIAGNOSIS — N2581 Secondary hyperparathyroidism of renal origin: Secondary | ICD-10-CM | POA: Diagnosis not present

## 2022-07-23 DIAGNOSIS — D631 Anemia in chronic kidney disease: Secondary | ICD-10-CM | POA: Diagnosis not present

## 2022-07-23 DIAGNOSIS — N186 End stage renal disease: Secondary | ICD-10-CM | POA: Diagnosis not present

## 2022-07-23 DIAGNOSIS — D509 Iron deficiency anemia, unspecified: Secondary | ICD-10-CM | POA: Diagnosis not present

## 2022-07-23 DIAGNOSIS — K31811 Angiodysplasia of stomach and duodenum with bleeding: Secondary | ICD-10-CM | POA: Diagnosis not present

## 2022-07-23 DIAGNOSIS — Z992 Dependence on renal dialysis: Secondary | ICD-10-CM | POA: Diagnosis not present

## 2022-07-23 DIAGNOSIS — N2581 Secondary hyperparathyroidism of renal origin: Secondary | ICD-10-CM | POA: Diagnosis not present

## 2022-07-24 ENCOUNTER — Encounter: Payer: Self-pay | Admitting: Family

## 2022-07-24 ENCOUNTER — Ambulatory Visit (INDEPENDENT_AMBULATORY_CARE_PROVIDER_SITE_OTHER): Payer: Medicare Other | Admitting: Family

## 2022-07-24 DIAGNOSIS — E1122 Type 2 diabetes mellitus with diabetic chronic kidney disease: Secondary | ICD-10-CM | POA: Diagnosis not present

## 2022-07-24 DIAGNOSIS — M869 Osteomyelitis, unspecified: Secondary | ICD-10-CM | POA: Diagnosis not present

## 2022-07-24 DIAGNOSIS — T827XXD Infection and inflammatory reaction due to other cardiac and vascular devices, implants and grafts, subsequent encounter: Secondary | ICD-10-CM | POA: Diagnosis not present

## 2022-07-24 DIAGNOSIS — Z89512 Acquired absence of left leg below knee: Secondary | ICD-10-CM

## 2022-07-24 DIAGNOSIS — E1142 Type 2 diabetes mellitus with diabetic polyneuropathy: Secondary | ICD-10-CM | POA: Diagnosis not present

## 2022-07-24 DIAGNOSIS — S88112A Complete traumatic amputation at level between knee and ankle, left lower leg, initial encounter: Secondary | ICD-10-CM

## 2022-07-24 MED ORDER — OXYCODONE-ACETAMINOPHEN 5-325 MG PO TABS: 1.0000 | ORAL_TABLET | Freq: Two times a day (BID) | ORAL | 0 refills | Status: DC | PRN

## 2022-07-24 NOTE — Progress Notes (Signed)
Post-Op Visit Note   Patient: Alexander [name]           Date of Birth: 08-Mar-1961           MRN: [id number] Visit Date: 07/24/2022 PCP: [name], MD  Chief Complaint:  Chief Complaint  Patient presents with   Left Leg - Routine Post Op    05/11/22 left BKA    HPI:  HPI The patient is a 61 year old gentleman seen in follow-up status post left below-knee amputation.  He states that he has found to retained staples  Ortho Exam On examination of the left residual limb this is well consolidated well-healed there are 2 retained staples these were removed today without incident no bleeding no erythema no drainage  Visit Diagnoses: No diagnosis found.  Plan: Given an order for his left below-knee amputation prosthesis set up.  He will continue his shrinker.  Follow-up as needed  Follow-Up Instructions: Return if symptoms worsen or fail to improve.   Imaging: No results found.  Orders:  No orders of the defined types were placed in this encounter.  Meds ordered this encounter  Medications   oxyCODONE-acetaminophen (PERCOCET/ROXICET) 5-325 MG tablet    Sig: Take 1 tablet by mouth every 12 (twelve) hours as needed.    Dispense:  20 tablet    Refill:  0     PMFS History: Patient Active Problem List   Diagnosis Date Noted   Gangrene ([place]) 05/11/2022   Below-knee amputation of left lower extremity ([place]) 05/11/2022   History of right above knee amputation ([place]) 05/10/2022   Goals of care, counseling/discussion    Palliative care by specialist    Dehiscence of amputation stump of right lower extremity ([place])    Endocarditis 01/21/2022   Ulcer of right leg, limited to breakdown of skin ([place])    Lumbar back pain with radiculopathy affecting left lower extremity 01/14/2022   Dyslipidemia 01/14/2022   Type 2 diabetes mellitus with chronic kidney disease, with long-term current use of insulin ([place]) 01/14/2022   GERD without esophagitis 01/14/2022   Pressure injury of skin  01/14/2022   Intractable back pain 01/13/2022   Blister 11/08/2021   Severe protein-calorie malnutrition ([place]) 11/02/2021   Pacemaker infection ([place])    Transaminitis    Tricuspid valve vegetation    Thrombocytopenia ([place]) 10/26/2021   Hyponatremia 10/26/2021   Acute on chronic diastolic CHF (congestive heart failure) ([place]) 10/26/2021   Atrial fibrillation with RVR ([place]) 10/25/2021   Symptomatic anemia 09/30/2021   ESRD on dialysis ([place]) 09/30/2021   PAF (paroxysmal atrial fibrillation) ([place]) 09/30/2021   NSVT (nonsustained ventricular tachycardia) (HCC)    Pancytopenia ([facility])    Acute blood loss anemia 07/18/2020   Hemodialysis patient ([place]) 06/04/2020   Necrotizing glomerulonephritis 06/04/2020   Heme positive stool    Melena    Acute GI bleeding 05/23/2020   Acute hematogenous osteomyelitis of right foot ([place]) 03/11/2020   Diabetic foot ulcer associated with type 2 diabetes mellitus ([place]) 02/19/2020   Heel ulcer ([place]) 11/05/2019   Gangrene of extremity due to atherosclerosis of artery ([place]) 09/09/2017   Essential hypertension, benign 02/07/2015   Renal insufficiency 02/07/2015   Post PTCA 11/09/2014   S/P PTCA (percutaneous transluminal coronary angioplasty) 11/09/2014   Pacemaker: [place] DR MRI [id number] Dual chamber pacemaker 10/08/2014 10/08/2014   Encounter for care of pacemaker 10/08/2014   Sinus node dysfunction (HCC) 10/08/2014   Cardiac asystole ([place]) 10/07/2014   Cardiac syncope 10/07/2014   Diabetes  mellitus with stage 3 chronic kidney disease ([place]) 10/07/2014   CAD (coronary artery disease), native coronary artery 10/07/2014   Diabetes mellitus due to underlying condition without complications ([place]) 38/75/6433   Anemia, unspecified 05/25/2014   Syncope 04/30/2014   Atherosclerosis of native arteries of the extremities with gangrene ([place]) 12/16/2013   Acute renal failure ([place]) 10/28/2013   PVD (peripheral vascular disease) ([place]) 10/27/2013   Leukocytosis  10/21/2013   Anemia 10/21/2013   Spinal stenosis, lumbar region, with neurogenic claudication 06/12/2012    Class: Diagnosis of   Diabetes mellitus with end stage renal disease ([place]) 03/15/2009   ALCOHOL ABUSE 03/15/2009   TOBACCO USE 03/15/2009   HYPERTENSION 03/15/2009   Past Medical History:  Diagnosis Date   Acute blood loss anemia    Alcohol abuse    Anemia    Anemia of chronic disease    Arthritis    "patient does not think so."   Atrial fibrillation ([place])    Cardiac syncope 10/07/2014   rapid A fib with 8 sec pauses on converison with syncope- temp pacing wire placed then PPM   Cataract    BILATERAL   Coronary artery disease    with stents   Diabetes mellitus    dx---been  awhile   Elevated LFTs    Endocarditis of tricuspid valve    ESRD (end stage renal disease) ([place])    M/W/F   Gangrene of right foot (HCC)    GERD (gastroesophageal reflux disease)    History of blood transfusion    Hyperlipemia    Hypertension    Malnutrition ([place])    Osteomyelitis (HCC)    right foot   Osteomyelitis of left foot ([place]) 08/19/2013   Peripheral vascular disease ([place])    Pneumonia    Presence of permanent cardiac pacemaker 10/08/2014   [employer]onic    Family History  Problem Relation Age of Onset   Diabetes type II Mother    Hypertension Mother    Diabetes Mother    Liver cancer Father    Diabetes type II Sister    Breast cancer Sister    Diabetes Sister    Hypertension Sister    Diabetes type II Brother    Kidney failure Brother    Diabetes Brother    Hypertension Brother    Diabetes type II Sister     Past Surgical History:  Procedure Laterality Date   A/V FISTULAGRAM Right 05/01/2021   Procedure: A/V FISTULAGRAM;  Surgeon: Waynetta Sandy, MD;  Location: [facility];  Service: Cardiovascular;  Laterality: Right;  failed fistulagram   ABDOMINAL AORTOGRAM W/LOWER EXTREMITY N/A 09/09/2017   Procedure: ABDOMINAL AORTOGRAM W/LOWER EXTREMITY;  Surgeon:  Angelia Mould, MD;  Location: [facility];  Service: Cardiovascular;  Laterality: N/A;   ABDOMINAL AORTOGRAM W/LOWER EXTREMITY Right 11/11/2019   Procedure: ABDOMINAL AORTOGRAM W/LOWER EXTREMITY;  Surgeon: Marty Heck, MD;  Location: [facility];  Service: Cardiovascular;  Laterality: Right;   ABDOMINAL AORTOGRAM W/LOWER EXTREMITY N/A 01/26/2022   Procedure: ABDOMINAL AORTOGRAM W/LOWER EXTREMITY;  Surgeon: Adrian Prows, MD;  Location: [facility];  Service: Cardiovascular;  Laterality: N/A;   AMPUTATION Left 08/19/2013   Procedure: AMPUTATION RAY;  Surgeon: Alta Corning, MD;  Location: [facility];  Service: Orthopedics;  Laterality: Left;  ray amputation left 5th   AMPUTATION Left 10/27/2013   Procedure: AMPUTATION DIGIT-LEFT 4TH TOE, 4th and 5th metatarsal.;  Surgeon: Angelia Mould, MD;  Location: [facility];  Service: Vascular;  Laterality: Left;   AMPUTATION Left 11/04/2013   Procedure: LEFT FOOT TRANS-METATARSAL AMPUTATION WITH WOUND CLOSURE ;  Surgeon: Alta Corning, MD;  Location: [facility];  Service: Orthopedics;  Laterality: Left;   AMPUTATION Right 09/10/2017   Procedure: RIGHT FOURTH AND FIFTH TOE AMPUTATION;  Surgeon: Angelia Mould, MD;  Location: [facility];  Service: Vascular;  Laterality: Right;   AMPUTATION Right 02/19/2020   Procedure: AMPUTATION RIGHT 3RD TOE;  Surgeon: Angelia Mould, MD;  Location: [facility];  Service: Vascular;  Laterality: Right;   AMPUTATION Right 03/11/2020   Procedure: RIGHT BELOW KNEE AMPUTATION;  Surgeon: Newt Minion, MD;  Location: [facility];  Service: Orthopedics;  Laterality: Right;   AMPUTATION Right 01/31/2022   Procedure: RIGHT ABOVE KNEE AMPUTATION;  Surgeon: Newt Minion, MD;  Location: [facility];  Service: Orthopedics;  Laterality: Right;   AMPUTATION Left 05/11/2022   Procedure: LEFT BELOW KNEE AMPUTATION;  Surgeon: Newt Minion, MD;  Location: [facility];  Service: Orthopedics;  Laterality: Left;   APPLICATION  OF WOUND VAC Right 02/19/2020   Procedure: Application Of Wound Vac Right foot;  Surgeon: Angelia Mould, MD;  Location: [facility];  Service: Vascular;  Laterality: Right;   APPLICATION OF WOUND VAC Left 05/11/2022   Procedure: APPLICATION OF WOUND VAC;  Surgeon: Newt Minion, MD;  Location: [facility];  Service: Orthopedics;  Laterality: Left;   AV FISTULA PLACEMENT Right 03/27/2021   Procedure: RIGHT ARM ARTERIOVENOUS (AV) FISTULA;  Surgeon: Angelia Mould, MD;  Location: [facility];  Service: Vascular;  Laterality: Right;   AV FISTULA PLACEMENT Right 05/10/2021   Procedure: RIGHT ARM ARTERIOVENOUS (AV) FISTULA;  Surgeon: Waynetta Sandy, MD;  Location: [facility];  Service: Vascular;  Laterality: Right;   [place] Right 08/11/2021   Procedure: RIGHT ARM SECOND STAGE BASILIC VEIN TRANSPOSITION;  Surgeon: Waynetta Sandy, MD;  Location: [place] [facility];  Service: Vascular;  Laterality: Right;   BIOPSY  07/20/2020   Procedure: BIOPSY;  Surgeon: Irving Copas., MD;  Location: [facility];  Service: Gastroenterology;;   CARDIAC CATHETERIZATION  10/07/2014   Procedure: LEFT HEART CATH AND CORONARY ANGIOGRAPHY;  Surgeon: Laverda Page, MD;  Location: [facility];  Service: Cardiovascular;;   COLONOSCOPY W/ POLYPECTOMY     COLONOSCOPY WITH PROPOFOL N/A 05/25/2020   Procedure: COLONOSCOPY WITH PROPOFOL;  Surgeon: Gatha Mayer, MD;  Location: [facility];  Service: Endoscopy;  Laterality: N/A;   EMBOLECTOMY Right 09/12/2017   Procedure: Thrombectomy  & Redo Right Below Knee Popliteal Artery Bypass Graft  ;  Surgeon: Waynetta Sandy, MD;  [facility];  Service: Vascular;  Laterality: Right;   ENTEROSCOPY N/A 07/20/2020   Procedure: ENTEROSCOPY;  Surgeon: Rush Landmark Telford Nab., MD;  Location: [facility];  Service: Gastroenterology;  Laterality: N/A;   ESOPHAGOGASTRODUODENOSCOPY (EGD) WITH PROPOFOL N/A 05/24/2020   Procedure:  ESOPHAGOGASTRODUODENOSCOPY (EGD) WITH PROPOFOL;  Surgeon: Ladene Artist, MD;  Location: [facility];  Service: Endoscopy;  Laterality: N/A;   EYE SURGERY Bilateral    cataract   FEMORAL-TIBIAL BYPASS GRAFT Left 10/27/2013   Procedure: BYPASS GRAFT LEFT FEMORAL- POSTERIOR TIBIAL ARTERY;  Surgeon: Angelia Mould, MD;  Location: [facility];  Service: Vascular;  Laterality: Left;   FEMORAL-TIBIAL BYPASS GRAFT Right 09/10/2017   Procedure: RIGHT SUPERFICIAL  FEMORAL ARTERY-BELOW KNEE POPLITEAL ARTERY BYPASS GRAFT WITH VEIN;  Surgeon: Angelia Mould, MD;  Location: [facility];  Service: Vascular;  Laterality: Right;   GIVENS CAPSULE STUDY N/A 07/20/2020   Procedure: GIVENS CAPSULE STUDY;  Surgeon: Irving Copas., MD;  Location: [facility];  Service: Gastroenterology;  Laterality: N/A;  capsule placed through scope into duodenum @ 0936   I & D EXTREMITY Left 10/27/2013   Procedure: IRRIGATION AND DEBRIDEMENT EXTREMITY- LEFT FOOT;  Surgeon: Angelia Mould, MD;  Location: [facility];  Service: Vascular;  Laterality: Left;   I & D EXTREMITY Right 01/26/2020   Procedure: IRRIGATION AND DEBRIDEMENT EXTREMITY RIGHT FOOT WOUND;  Surgeon: Angelia Mould, MD;  Location: [facility];  Service: Vascular;  Laterality: Right;   INSERT / REPLACE / REMOVE PACEMAKER     INSERTION OF DIALYSIS CATHETER Right 11/03/2021   Procedure: RIGHT INTERNAL JUGULAR INSERTION OF TUNNELED DIALYSIS CATHETER;  Surgeon: Angelia Mould, MD;  Location: [facility];  Service: Vascular;  Laterality: Right;   INTRAOPERATIVE ARTERIOGRAM Right 09/10/2017   Procedure: INTRA OPERATIVE ARTERIOGRAM;  Surgeon: Angelia Mould, MD;  Location: [facility];  Service: Vascular;  Laterality: Right;   IR ANGIOGRAM FOLLOW UP STUDY  09/09/2017   IR FLUORO GUIDE CV LINE RIGHT  05/31/2020   IR LUMBAR DISC ASPIRATION W/IMG GUIDE  01/19/2022   IR US GUIDE VASC ACCESS RIGHT  06/01/2020   LEFT HEART CATH AND CORONARY ANGIOGRAPHY N/A  08/19/2020   Procedure: LEFT HEART CATH AND CORONARY ANGIOGRAPHY;  Surgeon: Adrian Prows, MD;  Location: [facility];  Service: Cardiovascular;  Laterality: N/A;   LEFT HEART CATHETERIZATION WITH CORONARY ANGIOGRAM N/A 11/09/2014   Procedure: LEFT HEART CATHETERIZATION WITH CORONARY ANGIOGRAM;  Surgeon: Laverda Page, MD;  Location: [facility] LAB;  Service: Cardiovascular;  Laterality: N/A;   LOWER EXTREMITY ANGIOGRAM Right 11/08/2015   Procedure: Lower Extremity Angiogram;  Surgeon: Serafina Mitchell, MD;  Location: [facility];  Service: Cardiovascular;  Laterality: Right;   LUMBAR LAMINECTOMY  06/13/2012   Procedure: MICRODISCECTOMY LUMBAR LAMINECTOMY;  Surgeon: Jessy Oto, MD;  Location: [place];  Service: Orthopedics;  Laterality: N/A;  Central laminectomy L2-3, L3-4, L4-5   PACEMAKER INSERTION  10/08/2014   MDT Advisa MRI compatible dual chamber pacemaker implanted by Dr Caryl Comes for syncope with post-termination pauses   PERCUTANEOUS CORONARY STENT INTERVENTION (PCI-S)  11/09/2014   des to lad & distal circumflex         Dr  [name]   PERIPHERAL VASCULAR BALLOON ANGIOPLASTY Right 11/11/2019   Procedure: PERIPHERAL VASCULAR BALLOON ANGIOPLASTY;  Surgeon: Marty Heck, MD;  Location: [facility];  Service: Cardiovascular;  Laterality: Right;  below knee popliteal, tibioperoneal trunk, posterior tibial arteries   PERIPHERAL VASCULAR CATHETERIZATION N/A 11/08/2015   Procedure: Abdominal Aortogram;  Surgeon: Serafina Mitchell, MD;  Location: [facility];  Service: Cardiovascular;  Laterality: N/A;   PERIPHERAL VASCULAR CATHETERIZATION Right 11/08/2015   Procedure: Peripheral Vascular Atherectomy;  Surgeon: Serafina Mitchell, MD;  Location: [facility];  Service: Cardiovascular;  Laterality: Right;   PERIPHERAL VASCULAR CATHETERIZATION N/A 10/10/2016   Procedure: Lower Extremity Angiography;  Surgeon: Waynetta Sandy, MD;  Location: [facility];   Service: Cardiovascular;  Laterality: N/A;   PERIPHERAL VASCULAR CATHETERIZATION Right 10/10/2016   Procedure: Peripheral Vascular Atherectomy;  Surgeon: Waynetta Sandy, MD;  Location: [facility];  Service: Cardiovascular;  Laterality: Right;  Popliteal   PERMANENT PACEMAKER INSERTION N/A 10/08/2014   Procedure: PERMANENT PACEMAKER INSERTION;  Surgeon: Deboraha Sprang, MD;  Location: [facility];  Service: Cardiovascular;  Laterality: N/A;   RADIOLOGY WITH ANESTHESIA N/A 01/19/2022   Procedure: Disc Aspiration;  Surgeon: Pedro Earls, MD;  Location: [facility] [place];  Service: Radiology;  Laterality: N/A;   SUBMUCOSAL TATTOO INJECTION  07/20/2020   Procedure: SUBMUCOSAL TATTOO INJECTION;  Surgeon: Irving Copas., MD;  Location: [facility];  Service: Gastroenterology;;   TEE WITHOUT CARDIOVERSION N/A 10/27/2021   Procedure: TRANSESOPHAGEAL ECHOCARDIOGRAM (TEE);  Surgeon: Adrian Prows, MD;  [facility];  Service: Cardiovascular;  Laterality: N/A;   TEMPORARY PACEMAKER INSERTION Bilateral 10/07/2014   Procedure: TEMPORARY PACEMAKER INSERTION;  Surgeon: Laverda Page, MD;  Location: [facility] CATH LAB;  Service: Cardiovascular;  Laterality: Bilateral;   WOUND DEBRIDEMENT Right 02/19/2020   Procedure: DEBRIDEMENT WOUND RIGHT FOOT;  Surgeon: Angelia Mould, MD;  Location: [facility] OR;  Service: Vascular;  Laterality: Right;   Social History   Occupational History   Occupation: Disabled  Tobacco Use   Smoking status: Every Day    Types: Cigars    Passive exposure: Current   Smokeless tobacco: Never   Tobacco comments:    Smokes cigars daily, has not smoked cigarettes since 2014  Vaping Use   Vaping Use: Never used  Substance and Sexual Activity   Alcohol use: Not Currently    Alcohol/week: 21.0 standard drinks of alcohol    Types: 21 Glasses of wine per week    Comment: 1/2 to a whole beer once a week as of 05/10/22   Drug use: No   Sexual activity:  Not on file

## 2022-07-25 DIAGNOSIS — Z992 Dependence on renal dialysis: Secondary | ICD-10-CM | POA: Diagnosis not present

## 2022-07-25 DIAGNOSIS — D509 Iron deficiency anemia, unspecified: Secondary | ICD-10-CM | POA: Diagnosis not present

## 2022-07-25 DIAGNOSIS — N186 End stage renal disease: Secondary | ICD-10-CM | POA: Diagnosis not present

## 2022-07-25 DIAGNOSIS — K31811 Angiodysplasia of stomach and duodenum with bleeding: Secondary | ICD-10-CM | POA: Diagnosis not present

## 2022-07-25 DIAGNOSIS — N2581 Secondary hyperparathyroidism of renal origin: Secondary | ICD-10-CM | POA: Diagnosis not present

## 2022-07-25 DIAGNOSIS — I5022 Chronic systolic (congestive) heart failure: Secondary | ICD-10-CM | POA: Diagnosis not present

## 2022-07-25 DIAGNOSIS — D631 Anemia in chronic kidney disease: Secondary | ICD-10-CM | POA: Diagnosis not present

## 2022-07-27 DIAGNOSIS — D631 Anemia in chronic kidney disease: Secondary | ICD-10-CM | POA: Diagnosis not present

## 2022-07-27 DIAGNOSIS — D509 Iron deficiency anemia, unspecified: Secondary | ICD-10-CM | POA: Diagnosis not present

## 2022-07-27 DIAGNOSIS — K31811 Angiodysplasia of stomach and duodenum with bleeding: Secondary | ICD-10-CM | POA: Diagnosis not present

## 2022-07-27 DIAGNOSIS — Z992 Dependence on renal dialysis: Secondary | ICD-10-CM | POA: Diagnosis not present

## 2022-07-27 DIAGNOSIS — N186 End stage renal disease: Secondary | ICD-10-CM | POA: Diagnosis not present

## 2022-07-27 DIAGNOSIS — N2581 Secondary hyperparathyroidism of renal origin: Secondary | ICD-10-CM | POA: Diagnosis not present

## 2022-07-30 DIAGNOSIS — Z992 Dependence on renal dialysis: Secondary | ICD-10-CM | POA: Diagnosis not present

## 2022-07-30 DIAGNOSIS — N186 End stage renal disease: Secondary | ICD-10-CM | POA: Diagnosis not present

## 2022-07-30 DIAGNOSIS — K31811 Angiodysplasia of stomach and duodenum with bleeding: Secondary | ICD-10-CM | POA: Diagnosis not present

## 2022-07-30 DIAGNOSIS — D509 Iron deficiency anemia, unspecified: Secondary | ICD-10-CM | POA: Diagnosis not present

## 2022-07-30 DIAGNOSIS — N2581 Secondary hyperparathyroidism of renal origin: Secondary | ICD-10-CM | POA: Diagnosis not present

## 2022-07-30 DIAGNOSIS — D631 Anemia in chronic kidney disease: Secondary | ICD-10-CM | POA: Diagnosis not present

## 2022-08-01 DIAGNOSIS — Z992 Dependence on renal dialysis: Secondary | ICD-10-CM | POA: Diagnosis not present

## 2022-08-01 DIAGNOSIS — D631 Anemia in chronic kidney disease: Secondary | ICD-10-CM | POA: Diagnosis not present

## 2022-08-01 DIAGNOSIS — N2581 Secondary hyperparathyroidism of renal origin: Secondary | ICD-10-CM | POA: Diagnosis not present

## 2022-08-01 DIAGNOSIS — D509 Iron deficiency anemia, unspecified: Secondary | ICD-10-CM | POA: Diagnosis not present

## 2022-08-01 DIAGNOSIS — K31811 Angiodysplasia of stomach and duodenum with bleeding: Secondary | ICD-10-CM | POA: Diagnosis not present

## 2022-08-01 DIAGNOSIS — N186 End stage renal disease: Secondary | ICD-10-CM | POA: Diagnosis not present

## 2022-08-03 DIAGNOSIS — N186 End stage renal disease: Secondary | ICD-10-CM | POA: Diagnosis not present

## 2022-08-03 DIAGNOSIS — D509 Iron deficiency anemia, unspecified: Secondary | ICD-10-CM | POA: Diagnosis not present

## 2022-08-03 DIAGNOSIS — D631 Anemia in chronic kidney disease: Secondary | ICD-10-CM | POA: Diagnosis not present

## 2022-08-03 DIAGNOSIS — K31811 Angiodysplasia of stomach and duodenum with bleeding: Secondary | ICD-10-CM | POA: Diagnosis not present

## 2022-08-03 DIAGNOSIS — N2581 Secondary hyperparathyroidism of renal origin: Secondary | ICD-10-CM | POA: Diagnosis not present

## 2022-08-03 DIAGNOSIS — Z992 Dependence on renal dialysis: Secondary | ICD-10-CM | POA: Diagnosis not present

## 2022-08-06 DIAGNOSIS — N2581 Secondary hyperparathyroidism of renal origin: Secondary | ICD-10-CM | POA: Diagnosis not present

## 2022-08-06 DIAGNOSIS — N186 End stage renal disease: Secondary | ICD-10-CM | POA: Diagnosis not present

## 2022-08-06 DIAGNOSIS — D631 Anemia in chronic kidney disease: Secondary | ICD-10-CM | POA: Diagnosis not present

## 2022-08-06 DIAGNOSIS — Z992 Dependence on renal dialysis: Secondary | ICD-10-CM | POA: Diagnosis not present

## 2022-08-06 DIAGNOSIS — D509 Iron deficiency anemia, unspecified: Secondary | ICD-10-CM | POA: Diagnosis not present

## 2022-08-06 DIAGNOSIS — K31811 Angiodysplasia of stomach and duodenum with bleeding: Secondary | ICD-10-CM | POA: Diagnosis not present

## 2022-08-07 ENCOUNTER — Encounter: Payer: Medicare Other | Attending: Physical Medicine & Rehabilitation | Admitting: Physical Medicine & Rehabilitation

## 2022-08-07 ENCOUNTER — Encounter: Payer: Self-pay | Admitting: Physical Medicine & Rehabilitation

## 2022-08-07 VITALS — BP 127/78 | HR 103 | Ht 72.0 in | Wt 183.4 lb

## 2022-08-07 DIAGNOSIS — S88112A Complete traumatic amputation at level between knee and ankle, left lower leg, initial encounter: Secondary | ICD-10-CM | POA: Insufficient documentation

## 2022-08-07 DIAGNOSIS — G546 Phantom limb syndrome with pain: Secondary | ICD-10-CM | POA: Insufficient documentation

## 2022-08-07 DIAGNOSIS — Z89611 Acquired absence of right leg above knee: Secondary | ICD-10-CM | POA: Insufficient documentation

## 2022-08-07 MED ORDER — OXYCODONE-ACETAMINOPHEN 5-325 MG PO TABS: 1.0000 | ORAL_TABLET | Freq: Two times a day (BID) | ORAL | 0 refills | Status: DC | PRN

## 2022-08-07 NOTE — Progress Notes (Signed)
Subjective:    Patient ID: Alexander [name], male    DOB: [birth year], 61 y.o.   MRN: [id number]  HPI  61 yo male with ESRD and PAD, status post prior Right AKA and Left TMA . Left BKA per Dr Jefferson Fuel last visit went to SNF for ~1wk post op Using bed pain and urinal for bowel and bladder Having phantom limb pain on both sides takes gabapentin on as needed basis less than once a day , takes oxycodone as needed as well but also not on daily basis Ortho has been managing pain post op for last ~55mobut has released pt.  Pt has ~40 tab of Oxycodone '5mg'$  left from rx. Ortho placed order for both Left BKA and Right AK prosthesis but even with insurance could not afford Discussed that transfers may be possible with Left BK prothesis only  Pain Inventory Average Pain 5 Pain Right Now 3 My pain is intermittent, sharp, and aching  In the last 24 hours, has pain interfered with the following? General activity 0 Relation with others 0 Enjoyment of life 0 What TIME of day is your pain at its worst? night Sleep (in general) Poor  Pain is worse with: unsure and some activites Pain improves with: rest and medication Relief from Meds: 7  Family History  Problem Relation Age of Onset   Diabetes type II Mother    Hypertension Mother    Diabetes Mother    Liver cancer Father    Diabetes type II Sister    Breast cancer Sister    Diabetes Sister    Hypertension Sister    Diabetes type II Brother    Kidney failure Brother    Diabetes Brother    Hypertension Brother    Diabetes type II Sister    Social History   Socioeconomic History   Marital status: Married    Spouse name: Not on file   Number of children: 2   Years of education: Not on file   Highest education level: Not on file  Occupational History   Occupation: Disabled  Tobacco Use   Smoking status: Every Day    Types: Cigars    Passive exposure: Current   Smokeless tobacco: Never   Tobacco comments:    Smokes cigars daily,  has not smoked cigarettes since 2014  Vaping Use   Vaping Use: Never used  Substance and Sexual Activity   Alcohol use: Not Currently    Alcohol/week: 21.0 standard drinks of alcohol    Types: 21 Glasses of wine per week    Comment: 1/2 to a whole beer once a week as of 05/10/22   Drug use: No   Sexual activity: Not on file  Other Topics Concern   Not on file  Social History Narrative   Not on file   Social Determinants of Health   Financial Resource Strain: Not on file  Food Insecurity: Not on file  Transportation Needs: Not on file  Physical Activity: Not on file  Stress: Not on file  Social Connections: Not on file   Past Surgical History:  Procedure Laterality Date   A/V FISTULAGRAM Right 05/01/2021   Procedure: A/V FISTULAGRAM;  Surgeon: CWaynetta Sandy MD;  [facility];  Service: Cardiovascular;  Laterality: Right;  failed fistulagram   ABDOMINAL AORTOGRAM W/LOWER EXTREMITY N/A 09/09/2017   Procedure: ABDOMINAL AORTOGRAM W/LOWER EXTREMITY;  Surgeon: DAngelia Mould MD;  [facility];  Service: Cardiovascular;  Laterality:  N/A;   ABDOMINAL AORTOGRAM W/LOWER EXTREMITY Right 11/11/2019   Procedure: ABDOMINAL AORTOGRAM W/LOWER EXTREMITY;  Surgeon: Marty Heck, MD;  Location: [place] [facility];  Service: Cardiovascular;  Laterality: Right;   ABDOMINAL AORTOGRAM W/LOWER EXTREMITY N/A 01/26/2022   Procedure: ABDOMINAL AORTOGRAM W/LOWER EXTREMITY;  Surgeon: Adrian Prows, MD;  Location: [place] [facility];  Service: Cardiovascular;  Laterality: N/A;   AMPUTATION Left 08/19/2013   Procedure: AMPUTATION RAY;  Surgeon: Alta Corning, MD;  Location: [place];  Service: Orthopedics;  Laterality: Left;  ray amputation left 5th   AMPUTATION Left 10/27/2013   Procedure: AMPUTATION DIGIT-LEFT 4TH TOE, 4th and 5th metatarsal.;  Surgeon: Angelia Mould, MD;  Location: [place];  Service: Vascular;  Laterality: Left;   AMPUTATION Left  11/04/2013   Procedure: LEFT FOOT TRANS-METATARSAL AMPUTATION WITH WOUND CLOSURE ;  Surgeon: Alta Corning, MD;  Location: [place];  Service: Orthopedics;  Laterality: Left;   AMPUTATION Right 09/10/2017   Procedure: RIGHT FOURTH AND FIFTH TOE AMPUTATION;  Surgeon: Angelia Mould, MD;  Location: [place];  Service: Vascular;  Laterality: Right;   AMPUTATION Right 02/19/2020   Procedure: AMPUTATION RIGHT 3RD TOE;  Surgeon: Angelia Mould, MD;  Location: [place];  Service: Vascular;  Laterality: Right;   AMPUTATION Right 03/11/2020   Procedure: RIGHT BELOW KNEE AMPUTATION;  Surgeon: Newt Minion, MD;  Location: [place];  Service: Orthopedics;  Laterality: Right;   AMPUTATION Right 01/31/2022   Procedure: RIGHT ABOVE KNEE AMPUTATION;  Surgeon: Newt Minion, MD;  Location: [place];  Service: Orthopedics;  Laterality: Right;   AMPUTATION Left 05/11/2022   Procedure: LEFT BELOW KNEE AMPUTATION;  Surgeon: Newt Minion, MD;  Location: [place];  Service: Orthopedics;  Laterality: Left;   APPLICATION OF WOUND VAC Right 02/19/2020   Procedure: Application Of Wound Vac Right foot;  Surgeon: Angelia Mould, MD;  Location: [place];  Service: Vascular;  Laterality: Right;   APPLICATION OF WOUND VAC Left 05/11/2022   Procedure: APPLICATION OF WOUND VAC;  Surgeon: Newt Minion, MD;  Location: [place];  Service: Orthopedics;  Laterality: Left;   AV FISTULA PLACEMENT Right 03/27/2021   Procedure: RIGHT ARM ARTERIOVENOUS (AV) FISTULA;  Surgeon: Angelia Mould, MD;  Location: [place];  Service: Vascular;  Laterality: Right;   AV FISTULA PLACEMENT Right 05/10/2021   Procedure: RIGHT ARM ARTERIOVENOUS (AV) FISTULA;  Surgeon: Waynetta Sandy, MD;  Location: [place];  Service: Vascular;  Laterality: Right;   [place] Right 08/11/2021   Procedure: RIGHT ARM SECOND STAGE BASILIC VEIN TRANSPOSITION;  Surgeon: Waynetta Sandy, MD;  Location: [place];   Service: Vascular;  Laterality: Right;   BIOPSY  07/20/2020   Procedure: BIOPSY;  Surgeon: Irving Copas., MD;  Location: [place];  Service: Gastroenterology;;   CARDIAC CATHETERIZATION  10/07/2014   Procedure: LEFT HEART CATH AND CORONARY ANGIOGRAPHY;  Surgeon: Laverda Page, MD;  Location: [facility] CATH LAB;  Service: Cardiovascular;;   COLONOSCOPY W/ POLYPECTOMY     COLONOSCOPY WITH PROPOFOL N/A 05/25/2020   Procedure: COLONOSCOPY WITH PROPOFOL;  Surgeon: Gatha Mayer, MD;  [facility];  Service: Endoscopy;  Laterality: N/A;   EMBOLECTOMY Right 09/12/2017   Procedure: Thrombectomy  & Redo Right Below Knee Popliteal Artery Bypass Graft  ;  Surgeon: Waynetta Sandy, MD;  Location: [place];  Service: Vascular;  Laterality: Right;   ENTEROSCOPY N/A 07/20/2020  Procedure: ENTEROSCOPY;  Surgeon: Rush Landmark Telford Nab., MD;  Location: [place];  Service: Gastroenterology;  Laterality: N/A;   ESOPHAGOGASTRODUODENOSCOPY (EGD) WITH PROPOFOL N/A 05/24/2020   Procedure: ESOPHAGOGASTRODUODENOSCOPY (EGD) WITH PROPOFOL;  Surgeon: Ladene Artist, MD;  [facility];  Service: Endoscopy;  Laterality: N/A;   EYE SURGERY Bilateral    cataract   FEMORAL-TIBIAL BYPASS GRAFT Left 10/27/2013   Procedure: BYPASS GRAFT LEFT FEMORAL- POSTERIOR TIBIAL ARTERY;  Surgeon: Angelia Mould, MD;  Location: [place];  Service: Vascular;  Laterality: Left;   FEMORAL-TIBIAL BYPASS GRAFT Right 09/10/2017   Procedure: RIGHT SUPERFICIAL  FEMORAL ARTERY-BELOW KNEE POPLITEAL ARTERY BYPASS GRAFT WITH VEIN;  Surgeon: Angelia Mould, MD;  Location: [place];  Service: Vascular;  Laterality: Right;   GIVENS CAPSULE STUDY N/A 07/20/2020   Procedure: GIVENS CAPSULE STUDY;  Surgeon: Irving Copas., MD;  Location: [place];  Service: Gastroenterology;  Laterality: N/A;  capsule placed through scope into duodenum @ 0936   I & D EXTREMITY Left 10/27/2013   Procedure:  IRRIGATION AND DEBRIDEMENT EXTREMITY- LEFT FOOT;  Surgeon: Angelia Mould, MD;  Location: [place];  Service: Vascular;  Laterality: Left;   I & D EXTREMITY Right 01/26/2020   Procedure: IRRIGATION AND DEBRIDEMENT EXTREMITY RIGHT FOOT WOUND;  Surgeon: Angelia Mould, MD;  Location: [place];  Service: Vascular;  Laterality: Right;   INSERT / REPLACE / REMOVE PACEMAKER     INSERTION OF DIALYSIS CATHETER Right 11/03/2021   Procedure: RIGHT INTERNAL JUGULAR INSERTION OF TUNNELED DIALYSIS CATHETER;  Surgeon: Angelia Mould, MD;  Location: [place];  Service: Vascular;  Laterality: Right;   INTRAOPERATIVE ARTERIOGRAM Right 09/10/2017   Procedure: INTRA OPERATIVE ARTERIOGRAM;  Surgeon: Angelia Mould, MD;  Location: [place];  Service: Vascular;  Laterality: Right;   IR ANGIOGRAM FOLLOW UP STUDY  09/09/2017   IR FLUORO GUIDE CV LINE RIGHT  05/31/2020   IR LUMBAR DISC ASPIRATION W/IMG GUIDE  01/19/2022   IR US GUIDE VASC ACCESS RIGHT  06/01/2020   LEFT HEART CATH AND CORONARY ANGIOGRAPHY N/A 08/19/2020   Procedure: LEFT HEART CATH AND CORONARY ANGIOGRAPHY;  Surgeon: Adrian Prows, MD;  Location: [place] [facility];  Service: Cardiovascular;  Laterality: N/A;   LEFT HEART CATHETERIZATION WITH CORONARY ANGIOGRAM N/A 11/09/2014   Procedure: LEFT HEART CATHETERIZATION WITH CORONARY ANGIOGRAM;  Surgeon: Laverda Page, MD;  Location: [facility] CATH LAB;  Service: Cardiovascular;  Laterality: N/A;   LOWER EXTREMITY ANGIOGRAM Right 11/08/2015   Procedure: Lower Extremity Angiogram;  Surgeon: Serafina Mitchell, MD;  Location: [place] [facility];  Service: Cardiovascular;  Laterality: Right;   LUMBAR LAMINECTOMY  06/13/2012   Procedure: MICRODISCECTOMY LUMBAR LAMINECTOMY;  Surgeon: Jessy Oto, MD;  Location: [place];  Service: Orthopedics;  Laterality: N/A;  Central laminectomy L2-3, L3-4, L4-5   PACEMAKER INSERTION  10/08/2014   MDT Advisa MRI compatible dual chamber pacemaker implanted by Dr Caryl Comes  for syncope with post-termination pauses   PERCUTANEOUS CORONARY STENT INTERVENTION (PCI-S)  11/09/2014   des to lad & distal circumflex         Dr  [name]   PERIPHERAL VASCULAR BALLOON ANGIOPLASTY Right 11/11/2019   Procedure: PERIPHERAL VASCULAR BALLOON ANGIOPLASTY;  Surgeon: Marty Heck, MD;  Location: [place] [facility];  Service: Cardiovascular;  Laterality: Right;  below knee popliteal, tibioperoneal trunk, posterior tibial arteries   PERIPHERAL VASCULAR CATHETERIZATION N/A 11/08/2015   Procedure: Abdominal Aortogram;  Surgeon: Serafina Mitchell, MD;  Location: [facility]  INV[facility];  Service: Cardiovascular;  Laterality: N/A;   PERIPHERAL VASCULAR CATHETERIZATION Right 11/08/2015   Procedure: Peripheral Vascular Atherectomy;  Surgeon: Serafina Mitchell, MD;  Location: [place] [facility];  Service: Cardiovascular;  Laterality: Right;   PERIPHERAL VASCULAR CATHETERIZATION N/A 10/10/2016   Procedure: Lower Extremity Angiography;  Surgeon: Waynetta Sandy, MD;  Location: [place] [facility];  Service: Cardiovascular;  Laterality: N/A;   PERIPHERAL VASCULAR CATHETERIZATION Right 10/10/2016   Procedure: Peripheral Vascular Atherectomy;  Surgeon: Waynetta Sandy, MD;  Location: [place] [facility];  Service: Cardiovascular;  Laterality: Right;  Popliteal   PERMANENT PACEMAKER INSERTION N/A 10/08/2014   Procedure: PERMANENT PACEMAKER INSERTION;  Surgeon: Deboraha Sprang, MD;  Location: [facility] CATH LAB;  Service: Cardiovascular;  Laterality: N/A;   RADIOLOGY WITH ANESTHESIA N/A 01/19/2022   Procedure: Disc Aspiration;  Surgeon: Pedro Earls, MD;  Location: [place];  Service: Radiology;  Laterality: N/A;   SUBMUCOSAL TATTOO INJECTION  07/20/2020   Procedure: SUBMUCOSAL TATTOO INJECTION;  Surgeon: Irving Copas., MD;  Location: [place];  Service: Gastroenterology;;   TEE WITHOUT CARDIOVERSION N/A 10/27/2021   Procedure: TRANSESOPHAGEAL ECHOCARDIOGRAM (TEE);   Surgeon: Adrian Prows, MD;  Location: [facility] ENDOSCOPY;  Service: Cardiovascular;  Laterality: N/A;   TEMPORARY PACEMAKER INSERTION Bilateral 10/07/2014   Procedure: TEMPORARY PACEMAKER INSERTION;  Surgeon: Laverda Page, MD;  Location: [facility] CATH LAB;  Service: Cardiovascular;  Laterality: Bilateral;   WOUND DEBRIDEMENT Right 02/19/2020   Procedure: DEBRIDEMENT WOUND RIGHT FOOT;  Surgeon: Angelia Mould, MD;  Location: [facility] OR;  Service: Vascular;  Laterality: Right;   Past Surgical History:  Procedure Laterality Date   A/V FISTULAGRAM Right 05/01/2021   Procedure: A/V FISTULAGRAM;  Surgeon: Waynetta Sandy, MD;  Location: [place] [facility];  Service: Cardiovascular;  Laterality: Right;  failed fistulagram   ABDOMINAL AORTOGRAM W/LOWER EXTREMITY N/A 09/09/2017   Procedure: ABDOMINAL AORTOGRAM W/LOWER EXTREMITY;  Surgeon: Angelia Mould, MD;  Location: [place] [facility];  Service: Cardiovascular;  Laterality: N/A;   ABDOMINAL AORTOGRAM W/LOWER EXTREMITY Right 11/11/2019   Procedure: ABDOMINAL AORTOGRAM W/LOWER EXTREMITY;  Surgeon: Marty Heck, MD;  Location: [place] [facility];  Service: Cardiovascular;  Laterality: Right;   ABDOMINAL AORTOGRAM W/LOWER EXTREMITY N/A 01/26/2022   Procedure: ABDOMINAL AORTOGRAM W/LOWER EXTREMITY;  Surgeon: Adrian Prows, MD;  Location: [place] [facility];  Service: Cardiovascular;  Laterality: N/A;   AMPUTATION Left 08/19/2013   Procedure: AMPUTATION RAY;  Surgeon: Alta Corning, MD;  Location: [place];  Service: Orthopedics;  Laterality: Left;  ray amputation left 5th   AMPUTATION Left 10/27/2013   Procedure: AMPUTATION DIGIT-LEFT 4TH TOE, 4th and 5th metatarsal.;  Surgeon: Angelia Mould, MD;  Location: [place];  Service: Vascular;  Laterality: Left;   AMPUTATION Left 11/04/2013   Procedure: LEFT FOOT TRANS-METATARSAL AMPUTATION WITH WOUND CLOSURE ;  Surgeon: Alta Corning, MD;  Location: [place];  Service: Orthopedics;  Laterality:  Left;   AMPUTATION Right 09/10/2017   Procedure: RIGHT FOURTH AND FIFTH TOE AMPUTATION;  Surgeon: Angelia Mould, MD;  Location: [place];  Service: Vascular;  Laterality: Right;   AMPUTATION Right 02/19/2020   Procedure: AMPUTATION RIGHT 3RD TOE;  Surgeon: Angelia Mould, MD;  Location: [place];  Service: Vascular;  Laterality: Right;   AMPUTATION Right 03/11/2020   Procedure: RIGHT BELOW KNEE AMPUTATION;  Surgeon: Newt Minion, MD;  Location: [place];  Service: Orthopedics;  Laterality: Right;  AMPUTATION Right 01/31/2022   Procedure: RIGHT ABOVE KNEE AMPUTATION;  Surgeon: Newt Minion, MD;  Location: [place];  Service: Orthopedics;  Laterality: Right;   AMPUTATION Left 05/11/2022   Procedure: LEFT BELOW KNEE AMPUTATION;  Surgeon: Newt Minion, MD;  Location: [place];  Service: Orthopedics;  Laterality: Left;   APPLICATION OF WOUND VAC Right 02/19/2020   Procedure: Application Of Wound Vac Right foot;  Surgeon: Angelia Mould, MD;  Location: [place];  Service: Vascular;  Laterality: Right;   APPLICATION OF WOUND VAC Left 05/11/2022   Procedure: APPLICATION OF WOUND VAC;  Surgeon: Newt Minion, MD;  Location: [place];  Service: Orthopedics;  Laterality: Left;   AV FISTULA PLACEMENT Right 03/27/2021   Procedure: RIGHT ARM ARTERIOVENOUS (AV) FISTULA;  Surgeon: Angelia Mould, MD;  Location: [place];  Service: Vascular;  Laterality: Right;   AV FISTULA PLACEMENT Right 05/10/2021   Procedure: RIGHT ARM ARTERIOVENOUS (AV) FISTULA;  Surgeon: Waynetta Sandy, MD;  Location: [place];  Service: Vascular;  Laterality: Right;   [place] Right 08/11/2021   Procedure: RIGHT ARM SECOND STAGE BASILIC VEIN TRANSPOSITION;  Surgeon: Waynetta Sandy, MD;  Location: [place];  Service: Vascular;  Laterality: Right;   BIOPSY  07/20/2020   Procedure: BIOPSY;  Surgeon: Irving Copas., MD;  Location: [place];  Service:  Gastroenterology;;   CARDIAC CATHETERIZATION  10/07/2014   Procedure: LEFT HEART CATH AND CORONARY ANGIOGRAPHY;  Surgeon: Laverda Page, MD;  Location: [facility] CATH LAB;  Service: Cardiovascular;;   COLONOSCOPY W/ POLYPECTOMY     COLONOSCOPY WITH PROPOFOL N/A 05/25/2020   Procedure: COLONOSCOPY WITH PROPOFOL;  Surgeon: Gatha Mayer, MD;  [facility];  Service: Endoscopy;  Laterality: N/A;   EMBOLECTOMY Right 09/12/2017   Procedure: Thrombectomy  & Redo Right Below Knee Popliteal Artery Bypass Graft  ;  Surgeon: Waynetta Sandy, MD;  Location: [facility];  Service: Vascular;  Laterality: Right;   ENTEROSCOPY N/A 07/20/2020   Procedure: ENTEROSCOPY;  Surgeon: Rush Landmark Telford Nab., MD;  Location: [place];  Service: Gastroenterology;  Laterality: N/A;   ESOPHAGOGASTRODUODENOSCOPY (EGD) WITH PROPOFOL N/A 05/24/2020   Procedure: ESOPHAGOGASTRODUODENOSCOPY (EGD) WITH PROPOFOL;  Surgeon: Ladene Artist, MD;  [facility];  Service: Endoscopy;  Laterality: N/A;   EYE SURGERY Bilateral    cataract   FEMORAL-TIBIAL BYPASS GRAFT Left 10/27/2013   Procedure: BYPASS GRAFT LEFT FEMORAL- POSTERIOR TIBIAL ARTERY;  Surgeon: Angelia Mould, MD;  Location: [place];  Service: Vascular;  Laterality: Left;   FEMORAL-TIBIAL BYPASS GRAFT Right 09/10/2017   Procedure: RIGHT SUPERFICIAL  FEMORAL ARTERY-BELOW KNEE POPLITEAL ARTERY BYPASS GRAFT WITH VEIN;  Surgeon: Angelia Mould, MD;  Location: [place];  Service: Vascular;  Laterality: Right;   GIVENS CAPSULE STUDY N/A 07/20/2020   Procedure: GIVENS CAPSULE STUDY;  Surgeon: Irving Copas., MD;  Location: [place];  Service: Gastroenterology;  Laterality: N/A;  capsule placed through scope into duodenum @ 0936   I & D EXTREMITY Left 10/27/2013   Procedure: IRRIGATION AND DEBRIDEMENT EXTREMITY- LEFT FOOT;  Surgeon: Angelia Mould, MD;  Location: [place];  Service: Vascular;  Laterality: Left;   I & D  EXTREMITY Right 01/26/2020   Procedure: IRRIGATION AND DEBRIDEMENT EXTREMITY RIGHT FOOT WOUND;  Surgeon: Angelia Mould, MD;  Location: [place];  Service: Vascular;  Laterality: Right;   INSERT / REPLACE / REMOVE PACEMAKER     INSERTION OF  DIALYSIS CATHETER Right 11/03/2021   Procedure: RIGHT INTERNAL JUGULAR INSERTION OF TUNNELED DIALYSIS CATHETER;  Surgeon: Angelia Mould, MD;  Location: [place];  Service: Vascular;  Laterality: Right;   INTRAOPERATIVE ARTERIOGRAM Right 09/10/2017   Procedure: INTRA OPERATIVE ARTERIOGRAM;  Surgeon: Angelia Mould, MD;  Location: [place];  Service: Vascular;  Laterality: Right;   IR ANGIOGRAM FOLLOW UP STUDY  09/09/2017   IR FLUORO GUIDE CV LINE RIGHT  05/31/2020   IR LUMBAR DISC ASPIRATION W/IMG GUIDE  01/19/2022   IR US GUIDE VASC ACCESS RIGHT  06/01/2020   LEFT HEART CATH AND CORONARY ANGIOGRAPHY N/A 08/19/2020   Procedure: LEFT HEART CATH AND CORONARY ANGIOGRAPHY;  Surgeon: Adrian Prows, MD;  Location: [place] [facility] LAB;  Service: Cardiovascular;  Laterality: N/A;   LEFT HEART CATHETERIZATION WITH CORONARY ANGIOGRAM N/A 11/09/2014   Procedure: LEFT HEART CATHETERIZATION WITH CORONARY ANGIOGRAM;  Surgeon: Laverda Page, MD;  Location: [facility] CATH LAB;  Service: Cardiovascular;  Laterality: N/A;   LOWER EXTREMITY ANGIOGRAM Right 11/08/2015   Procedure: Lower Extremity Angiogram;  Surgeon: Serafina Mitchell, MD;  Location: [place] [facility] LAB;  Service: Cardiovascular;  Laterality: Right;   LUMBAR LAMINECTOMY  06/13/2012   Procedure: MICRODISCECTOMY LUMBAR LAMINECTOMY;  Surgeon: Jessy Oto, MD;  Location: [place];  Service: Orthopedics;  Laterality: N/A;  Central laminectomy L2-3, L3-4, L4-5   PACEMAKER INSERTION  10/08/2014   MDT Advisa MRI compatible dual chamber pacemaker implanted by Dr Caryl Comes for syncope with post-termination pauses   PERCUTANEOUS CORONARY STENT INTERVENTION (PCI-S)  11/09/2014   des to lad & distal circumflex         Dr   [name]   PERIPHERAL VASCULAR BALLOON ANGIOPLASTY Right 11/11/2019   Procedure: PERIPHERAL VASCULAR BALLOON ANGIOPLASTY;  Surgeon: Marty Heck, MD;  Location: [place] [facility] LAB;  Service: Cardiovascular;  Laterality: Right;  below knee popliteal, tibioperoneal trunk, posterior tibial arteries   PERIPHERAL VASCULAR CATHETERIZATION N/A 11/08/2015   Procedure: Abdominal Aortogram;  Surgeon: Serafina Mitchell, MD;  Location: [place] [facility] LAB;  Service: Cardiovascular;  Laterality: N/A;   PERIPHERAL VASCULAR CATHETERIZATION Right 11/08/2015   Procedure: Peripheral Vascular Atherectomy;  Surgeon: Serafina Mitchell, MD;  Location: [place] [facility] LAB;  Service: Cardiovascular;  Laterality: Right;   PERIPHERAL VASCULAR CATHETERIZATION N/A 10/10/2016   Procedure: Lower Extremity Angiography;  Surgeon: Waynetta Sandy, MD;  Location: [place] CV LAB;  Service: Cardiovascular;  Laterality: N/A;   PERIPHERAL VASCULAR CATHETERIZATION Right 10/10/2016   Procedure: Peripheral Vascular Atherectomy;  Surgeon: Waynetta Sandy, MD;  Location: [place] [facility] LAB;  Service: Cardiovascular;  Laterality: Right;  Popliteal   PERMANENT PACEMAKER INSERTION N/A 10/08/2014   Procedure: PERMANENT PACEMAKER INSERTION;  Surgeon: Deboraha Sprang, MD;  Location: [facility] CATH LAB;  Service: Cardiovascular;  Laterality: N/A;   RADIOLOGY WITH ANESTHESIA N/A 01/19/2022   Procedure: Disc Aspiration;  Surgeon: Pedro Earls, MD;  Location: [place];  Service: Radiology;  Laterality: N/A;   SUBMUCOSAL TATTOO INJECTION  07/20/2020   Procedure: SUBMUCOSAL TATTOO INJECTION;  Surgeon: Irving Copas., MD;  Location: [place];  Service: Gastroenterology;;   TEE WITHOUT CARDIOVERSION N/A 10/27/2021   Procedure: TRANSESOPHAGEAL ECHOCARDIOGRAM (TEE);  Surgeon: Adrian Prows, MD;  Location: [facility] ENDOSCOPY;  Service: Cardiovascular;  Laterality: N/A;   TEMPORARY PACEMAKER INSERTION Bilateral 10/07/2014    Procedure: TEMPORARY PACEMAKER INSERTION;  Surgeon: Laverda Page, MD;  Location: [facility] CATH LAB;  Service: Cardiovascular;  Laterality: Bilateral;  WOUND DEBRIDEMENT Right 02/19/2020   Procedure: DEBRIDEMENT WOUND RIGHT FOOT;  Surgeon: Angelia Mould, MD;  Location: [place];  Service: Vascular;  Laterality: Right;   Past Medical History:  Diagnosis Date   Acute blood loss anemia    Alcohol abuse    Anemia    Anemia of chronic disease    Arthritis    "patient does not think so."   Atrial fibrillation ([place])    Cardiac syncope 10/07/2014   rapid A fib with 8 sec pauses on converison with syncope- temp pacing wire placed then PPM   Cataract    BILATERAL   Coronary artery disease    with stents   Diabetes mellitus    dx---been  awhile   Elevated LFTs    Endocarditis of tricuspid valve    ESRD (end stage renal disease) ([place])    M/W/F   Gangrene of right foot (HCC)    GERD (gastroesophageal reflux disease)    History of blood transfusion    Hyperlipemia    Hypertension    Malnutrition ([place])    Osteomyelitis ([place])    right foot   Osteomyelitis of left foot ([place]) 08/19/2013   Peripheral vascular disease ([place])    Pneumonia    Presence of permanent cardiac pacemaker 10/08/2014   [employer]   BP 127/78   Pulse (!) 103   Ht 6' (1.829 m) Comment: before amputations  Wt 183 lb 6.7 oz (83.2 kg) Comment: last recorded  SpO2 96%   BMI 24.88 kg/m   Opioid Risk Score:   Fall Risk Score:  `1  Depression screen [facility] 2/9     08/07/2022   10:28 AM 05/10/2022    9:54 AM 01/18/2014    2:54 PM 11/20/2013    2:02 PM  Depression screen [facility] 2/9  Decreased Interest 0 0 0 0  Down, Depressed, Hopeless 0 0 0 0  PHQ - 2 Score 0 0 0 0  Altered sleeping  0    Tired, decreased energy  2    Change in appetite  3    Feeling bad or failure about yourself   0    Trouble concentrating  0    Moving slowly or fidgety/restless  0    Suicidal thoughts  0    PHQ-9 Score  5       Review of  Systems  Constitutional: Negative.   HENT: Negative.    Eyes: Negative.   Respiratory: Negative.    Cardiovascular: Negative.   Gastrointestinal: Negative.   Endocrine: Negative.   Genitourinary:        Dialysis MWF  Musculoskeletal:  Positive for back pain.  Skin: Negative.   Allergic/Immunologic: Negative.   Neurological: Negative.   Hematological:  Bruises/bleeds easily.       Eliquis  Psychiatric/Behavioral: Negative.    All other systems reviewed and are negative.      Objective:   Physical Exam Vitals and nursing note reviewed.  Constitutional:      Appearance: He is obese.  HENT:     Head: Normocephalic and atraumatic.  Eyes:     Extraocular Movements: Extraocular movements intact.     Conjunctiva/sclera: Conjunctivae normal.     Pupils: Pupils are equal, round, and reactive to light.  Musculoskeletal:        General: Normal range of motion.  Skin:    General: Skin is warm and dry.  Neurological:     General: No focal deficit present.     Mental  Status: He is alert and oriented to person, place, and time.  Psychiatric:        Mood and Affect: Mood normal.        Behavior: Behavior normal.    Left BK well healed , dry skin, minimal swelling        Assessment & Plan:    Left BKA good healing pt has not been rubbing, so a lot of dried skin has accumulated , discussed that he can use washcloth to gently exfoliate, no longer needs gauze under ACE, rec stump shrinker use  Phantom pain , infreq pt using gabapentin on an as needed less than daily basis Stump pain well relieved with oxycodone may continue use '5mg'$  prn less than daily is ~couple months post op will cont for now but consider tramadol or codeine .  Should have enough meds with additional 30tab oxyIR '5mg'$  to make it 3 months for appt with NP

## 2022-08-07 NOTE — Patient Instructions (Signed)
Your current meds should last until next visit

## 2022-08-08 DIAGNOSIS — D509 Iron deficiency anemia, unspecified: Secondary | ICD-10-CM | POA: Diagnosis not present

## 2022-08-08 DIAGNOSIS — N186 End stage renal disease: Secondary | ICD-10-CM | POA: Diagnosis not present

## 2022-08-08 DIAGNOSIS — Z992 Dependence on renal dialysis: Secondary | ICD-10-CM | POA: Diagnosis not present

## 2022-08-08 DIAGNOSIS — K31811 Angiodysplasia of stomach and duodenum with bleeding: Secondary | ICD-10-CM | POA: Diagnosis not present

## 2022-08-08 DIAGNOSIS — D631 Anemia in chronic kidney disease: Secondary | ICD-10-CM | POA: Diagnosis not present

## 2022-08-08 DIAGNOSIS — N2581 Secondary hyperparathyroidism of renal origin: Secondary | ICD-10-CM | POA: Diagnosis not present

## 2022-08-09 ENCOUNTER — Ambulatory Visit: Payer: Medicare Other | Admitting: Cardiology

## 2022-08-09 ENCOUNTER — Encounter: Payer: Self-pay | Admitting: Cardiology

## 2022-08-09 VITALS — BP 138/76 | HR 97 | Temp 97.8°F | Resp 16 | Ht 72.0 in | Wt 186.0 lb

## 2022-08-09 DIAGNOSIS — I739 Peripheral vascular disease, unspecified: Secondary | ICD-10-CM

## 2022-08-09 DIAGNOSIS — I39 Endocarditis and heart valve disorders in diseases classified elsewhere: Secondary | ICD-10-CM | POA: Diagnosis not present

## 2022-08-09 DIAGNOSIS — I2510 Atherosclerotic heart disease of native coronary artery without angina pectoris: Secondary | ICD-10-CM

## 2022-08-09 DIAGNOSIS — I480 Paroxysmal atrial fibrillation: Secondary | ICD-10-CM | POA: Diagnosis not present

## 2022-08-09 DIAGNOSIS — Z950 Presence of cardiac pacemaker: Secondary | ICD-10-CM

## 2022-08-09 MED ORDER — AMIODARONE HCL 200 MG PO TABS: 200.0000 mg | ORAL_TABLET | Freq: Two times a day (BID) | ORAL | 1 refills | Status: DC

## 2022-08-09 NOTE — Progress Notes (Signed)
Alexander [name] Date of Birth: 1960/12/05 MRN: [id number] Primary Care Provider:Tisovec, [name], MD Primary Cardiologist: Adrian Prows, MD,  Chief Complaint  Patient presents with   Hypertension   Coronary Artery Disease   PAD   Atrial Fibrillation   Follow-up    6 months    HPI  Alexander [name] is a 61 y.o. Caucasian male patient with hypertension, hyperlipidemia, diabetes mellitus with end-stage renal disease on hemodialysis, coronary artery disease with atherectomy followed by stenting to LAD in 2021 and to the distal circumflex in 2015, sick sinus syndrome and also complete heart block and paroxysmal atrial fibrillation SP permanent Medtronic pacemaker implantation in 2015 , severe PAD leading to eventual right BKA and left AKA, has history of left thumb to below-knee anterior tibial bypass, has chronic endocarditis involving pacemaker wire and had a prolonged hospitalization in December 2022.  He now presents for routine visit.  States that since left below-knee amputation and wound has healed, he is feeling the best he has in quite a while.  Denies dyspnea, chest pain, he has gone up on drinking alcohol and now smoking only about 2 to 3 cigars a day.  His left stump is completely healed.  He has no open wounds.  His daughter Alexander [name] present at bedside.     PAST MEDICAL HISTORY: Past Medical History:  Diagnosis Date   Acute blood loss anemia    Alcohol abuse    Anemia    Anemia of chronic disease    Arthritis    "patient does not think so."   Atrial fibrillation ([place])    Cardiac syncope 10/07/2014   rapid A fib with 8 sec pauses on converison with syncope- temp pacing wire placed then PPM   Cataract    BILATERAL   Coronary artery disease    with stents   Diabetes mellitus    dx---been  awhile   Elevated LFTs    Endocarditis of tricuspid valve    ESRD (end stage renal disease) ([place])    M/W/F   Gangrene of right foot (HCC)    GERD (gastroesophageal reflux disease)     History of blood transfusion    Hyperlipemia    Hypertension    Malnutrition ([place])    Osteomyelitis ([place])    right foot   Osteomyelitis of left foot ([place]) 08/19/2013   Peripheral vascular disease (HCC)    Pneumonia    Presence of permanent cardiac pacemaker 10/08/2014   Medtronic   FAMILY HISTORY: The patient's family history includes Breast cancer in his sister; Diabetes in his brother, mother, and sister; Diabetes type II in his brother, mother, sister, and sister; Hypertension in his brother, mother, and sister; Kidney failure in his brother; Liver cancer in his father.   SOCIAL HISTORY:  Social History   Tobacco Use   Smoking status: Every Day    Types: Cigars    Passive exposure: Current   Smokeless tobacco: Never   Tobacco comments:    Smokes cigars daily, has not smoked cigarettes since 2014  Substance Use Topics   Alcohol use: Not Currently    Alcohol/week: 21.0 standard drinks of alcohol    Types: 21 Glasses of wine per week    Comment: 1/2 to a whole beer once a week as of 05/10/22    Review of Systems  Cardiovascular:  Negative for chest pain, dyspnea on exertion and leg swelling.   PHYSICAL EXAM:    08/09/2022   10:57 AM 08/09/2022  10:54 AM 08/07/2022   10:25 AM  Vitals with BMI  Height  '6\' 0"'  '6\' 0"'   Weight  186 lbs 183 lbs 7 oz  BMI  51.88 41.66  Systolic 063 016 010  Diastolic 76 88 78  Pulse 97 74 103   Physical Exam Neck:     Vascular: No carotid bruit or JVD.  Cardiovascular:     Rate and Rhythm: Tachycardia present. Rhythm irregularly irregular.     Pulses:           Right popliteal pulse not accessible and left popliteal pulse not accessible.     Heart sounds: Murmur heard.     Midsystolic murmur is present with a grade of 2/6 radiating to the apex.  Pulmonary:     Effort: Pulmonary effort is normal.     Breath sounds: Normal breath sounds.  Abdominal:     General: Bowel sounds are normal.     Palpations: Abdomen is soft.   Musculoskeletal:     Comments: Right AKA, Left BKA  Skin:    Capillary Refill: Capillary refill takes less than 2 seconds.    LABORATORY DATA:    Latest Ref Rng & Units 05/14/2022    1:14 PM 05/13/2022    1:04 AM 05/12/2022    1:41 AM  CBC  WBC 4.0 - 10.5 K/uL 9.0  8.8  8.6   Hemoglobin 13.0 - 17.0 g/dL 7.6  7.6  9.0   Hematocrit 39.0 - 52.0 % 25.0  25.6  28.6   Platelets 150 - 400 K/uL 122  126  117        Latest Ref Rng & Units 05/14/2022    1:14 PM 05/13/2022    1:04 AM 05/12/2022    1:41 AM  CMP  Glucose 70 - 99 mg/dL 137  105  174   BUN 8 - 23 mg/dL '24  13  17   ' Creatinine 0.61 - 1.24 mg/dL 3.43  2.60  3.38   Sodium 135 - 145 mmol/L 132  134  136   Potassium 3.5 - 5.1 mmol/L 3.6  3.2  3.4   Chloride 98 - 111 mmol/L 93  96  92   CO2 22 - 32 mmol/L '27  29  28   ' Calcium 8.9 - 10.3 mg/dL 8.1  8.0  8.2    Lab Results  Component Value Date   HGBA1C 6.6 (H) 05/11/2022   HGBA1C 6.8 (H) 10/29/2021   HGBA1C 5.5 05/23/2020   External Labs:   Cholesterol, total 114.000 m 10/17/2020 HDL 39.000 mg 10/17/2020 LDL 32.000 mg 10/17/2020 Triglycerides 214.000 m 10/17/2020  A1C 8.800 01/30/2021  10/06/2019: A1c 6.5%.  Serum glucose 136 mg, BUN 55, creatinine 2.6, eGFR 25/30 ML.  Potassium 5.4.  CMP otherwise normal.  HB 10.2/HCT 31.4, platelets 141.  Normal indicis.  Total cholesterol 95, triglycerides 98, HDL 34, LDL 41.  Non-HDL cholesterol 61.  PSA normal.  11/17/2020: BUN 32, creatinine 4.44, potassium 3.9, sodium 130  12/01/2020: Platelets 151, hemoglobin 9.6, hematocrit 28.4  Allergies   Allergies  Allergen Reactions   Cytoxan [Cyclophosphamide] Other (See Comments)    Pancytopenia   Pletal [Cilostazol] Palpitations and Other (See Comments)    "Can hear heart beating loudly". Other reaction(s): Unknown   Keflex [Cephalexin] Nausea And Vomiting    Other reaction(s): Unknown   Tylenol [Acetaminophen] Other (See Comments)    Patient has a cold sweat   Iron Sucrose     Other  reaction(s): Unknown  Sulfa Antibiotics Rash    Other reaction(s): Unknown    Medications     Current Outpatient Medications:    amiodarone (PACERONE) 200 MG tablet, Take 1 tablet (200 mg total) by mouth 2 (two) times daily., Disp: 60 tablet, Rfl: 1   amoxicillin (AMOXIL) 500 MG capsule, Take 1 capsule (500 mg total) by mouth at bedtime., Disp: 30 capsule, Rfl: 11   apixaban (ELIQUIS) 5 MG TABS tablet, Take 1 tablet (5 mg total) by mouth 2 (two) times daily., Disp: 180 tablet, Rfl: 3   atorvastatin (LIPITOR) 20 MG tablet, Take 1 tablet by mouth once daily, Disp: 90 tablet, Rfl: 0   blood glucose meter kit and supplies KIT, Check blood sugar TID & QHS, Disp: 1 each, Rfl: 0   Blood Glucose Monitoring Suppl (ACCU-CHEK ADVANTAGE DIABETES) kit, Use as instructed, Disp: 1 each, Rfl: 0   doxycycline (VIBRA-TABS) 100 MG tablet, Take 1 tablet by mouth 2 (two) times daily., Disp: , Rfl:    furosemide (LASIX) 80 MG tablet, Take 80 mg by mouth See admin instructions. Take 80 mg by mouth on (non-dialysis days) Tuesdays, Thursdays, Saturdays & Sundays in the morning., Disp: , Rfl:    gabapentin (NEURONTIN) 300 MG capsule, Take 1 capsule (300 mg total) by mouth 3 (three) times daily. 3 times a day when necessary neuropathy pain, Disp: 90 capsule, Rfl: 3   glucose blood (ACCU-CHEK AVIVA PLUS) test strip, Use as instructed, Disp: 100 each, Rfl: 12   glucose blood (CHOICE DM FORA G20 TEST STRIPS) test strip, Check blood sugar TID & QHS, Disp: 100 each, Rfl: 12   glucose blood test strip, Use as instructed, Disp: 100 each, Rfl: 12   HUMULIN 70/30 KWIKPEN (70-30) 100 UNIT/ML KwikPen, Inject 0-20 Units into the skin in the morning and at bedtime. Sliding scale (if less than 130=no insulin), Disp: , Rfl:    lidocaine-prilocaine (EMLA) cream, Apply 1 application  topically as needed (prior to fistula access)., Disp: , Rfl:    oxyCODONE-acetaminophen (PERCOCET/ROXICET) 5-325 MG tablet, Take 1 tablet by mouth every 12  (twelve) hours as needed., Disp: 30 tablet, Rfl: 0   pantoprazole (PROTONIX) 40 MG tablet, Take 40 mg by mouth daily., Disp: , Rfl:    metoprolol tartrate (LOPRESSOR) 25 MG tablet, Take 1 tablet (25 mg total) by mouth 2 (two) times daily., Disp: 180 tablet, Rfl: 3   Cardiac Studies   Lexiscan myoview stress test 01/27/2016: 1. The resting electrocardiogram demonstrated normal sinus rhythm, normal resting conduction, no resting arrhythmias and normal rest repolarization. Stress EKG is non-diagnostic for ischemia as it a pharmacologic stress using Lexiscan. Stress symptoms included dyspnea. 2. The perfusion imaging study demonstrates a moderate-sized defect in the inferior wall extending from the base towards the apex, the defect slightly improved in stress images, suggests soft tissue attenuation. However scar in this region could not excluded. Left ventricular systolic function Calculated by QGS was 72%. This is a low risk study. Clinical correlation is recommended.  Echocardiogram 05/24/2020: LVEF 60-65%, no regional wall motion of normalities, severe left ventricular hypertrophy, grade 2 diastolic impairment, mildly elevated PASP, moderately dilated biatrial cavities, mild MR.   Left Heart Catheterization 08/19/20:  Prior coronary angiogram: 11/09/2014: Diamondback CSI coronary atherectomy and stenting of the mid LAD-3.0 x 28 mm Promus Premier DES following. Distal dominant circumflex coronary artery following diamondback atherectomy and implantation of a 2.5 x 16 mm Promus Premier DES. Previously placed stents in LAD and CX patent. Mild to moderate diffuse disease  in LAD, D1 and D2 and distal dominant Cx. RCA non dominant and severely diseased and small.  Echocardiogram 10/26/2022:   1. Left ventricular ejection fraction, by estimation, is 40 to 45%. The left ventricle has mildly decreased function. The left ventricle demonstrates global hypokinesis. There is severe concentric left ventricular  hypertrophy. Left ventricular diastolic  parameters are indeterminate.  2. Right ventricular systolic function is normal. The right ventricular size is normal. There is mildly elevated pulmonary artery systolic pressure. The estimated right ventricular systolic pressure is 01.7 mmHg.  3. Left atrial size was moderately dilated.  4. Right atrial size was mildly dilated.  5. The mitral valve is normal in structure. Moderate mitral valve regurgitation.  6. Vegetation or a thrombus measuring 1.2x1.2 cm seen on the RV lead of the pacemaker. Leaflet involvement cannot be excluded. . The tricuspid valve is degenerative. Tricuspid valve regurgitation is moderate to severe.  7. The aortic valve is normal in structure. Aortic valve regurgitation is not visualized. No aortic stenosis is present.  8. The inferior vena cava is dilated in size with <50% respiratory variability, suggesting right atrial pressure of 15 mmHg.   Comparison(s): Compared to 10/26/21, no change in vegetation on the right sided valves. Flail septal leaflet is new. MV vegetation is new. LVEF has further decreased from 40-45% to 30-35%.  Peripheral arteriogram 01/26/2022: Abdominal angiogram reveals presence of 2 renal arteries 1 on either sides.  The kidneys are atrophic.  Abdominal aorta shows mild atherosclerotic changes and calcification.  Aortoiliac bifurcation is widely patent.  Right iliac artery and right common femoral artery are widely patent and right SFA is occluded in the proximal segment.  Left femoral arteriogram with distal runoff: The left SFA is occluded in the midsegment.  Heavily calcified chronic occlusion.  There is a graft present from the left femoral artery to the left TP trunk which is widely patent.  There is paucity of vasculature about the left knee.  There is collaterals and single-vessel runoff in the form of posterior tibial artery below the left knee, one-vessel runoff.  AT is occluded and peroneal artery is  diffusely diseased.  Collaterals are evident.   Impression: Patient cannot have above-knee amputation in view of paucity of flow and collaterals and presence of bypass graft from left femoral artery to below the knee TP trunk.  However could look at below-knee amputation.   Pacemaker   Pacemaker in situ: Medtronic advica DR (MRI compatible) dual-chamber pacemaker 10/07/2014 Remote dual-chamber pacemaker transmission 01/17/2022: AP 18%, VP 16%.  Longevity 1 year and 11 months.  Lead impedance and thresholds within normal limits.  There were frequent A-fib episodes, AT/AF burden 38%.  EKG   EKG 08/09/2022: Atrial fibrillation with rapid ventricular response at rate of 138 bpm, left axis deviation, left anterior fascicular block.  Right bundle branch block.  Nonspecific T abnormality.  EKG 12/07/2018: Sinus rhythm at a rate of 70 bpm.  Left axis, left anterior fascicular block.  Complete right bundle branch block.  Compared to EKG 04/22/2020, poor R wave progression not present.   Assessment and plan:      ICD-10-CM   1. Chronic endocarditis due to other organism  I39 PCV ECHOCARDIOGRAM COMPLETE    2. Paroxysmal atrial fibrillation (HCC)  I48.0 amiodarone (PACERONE) 200 MG tablet    PCV ECHOCARDIOGRAM COMPLETE    3. Atherosclerosis of native coronary artery of native heart without angina pectoris  I25.10     4. PAD (peripheral artery disease) ([place])  I73.9 EKG 12-Lead    5. Pacemaker: Medtronic Advisa DR MRI [id number] Dual chamber pacemaker 10/08/2014  Z95.0 PCV ECHOCARDIOGRAM COMPLETE      This patients CHA2DS2-VASc Score 3 (HTN, DM, vasc) and yearly risk of stroke 3.2%.   Meds ordered this encounter  Medications   amiodarone (PACERONE) 200 MG tablet    Sig: Take 1 tablet (200 mg total) by mouth 2 (two) times daily.    Dispense:  60 tablet    Refill:  1    RECOMMENDATIONS:  CHOYA TORNOW is a 61 y.o. Caucasian male patient with hypertension, hyperlipidemia, diabetes mellitus with  end-stage renal disease on hemodialysis, coronary artery disease with atherectomy followed by stenting to LAD in 2021 and to the distal circumflex in 2015, sick sinus syndrome and also complete heart block and paroxysmal atrial fibrillation SP permanent Medtronic pacemaker implantation in 2015 , severe PAD leading to eventual right BKA and left AKA, has history of left thumb to below-knee anterior tibial bypass, has chronic endocarditis involving pacemaker wire and had a prolonged hospitalization in December 2022.  Fortunately his application stumps have completely healed.  He is presently feeling the best he has in quite a while and there is no clinical evidence of heart failure, although he is in A-fib with RVR.  In view of known cardiomyopathy, history of possible endocarditis noted on the pacemaker leads, not a good option to proceed with cardioversion.  I will start him on amiodarone 200 mg p.o. twice daily.  In view of dialysis, he cannot tolerate antihypertensive medications and in fact has to hold his metoprolol on the days of dialysis.  I could also consider addition of diltiazem on a low-dose for rate control if amiodarone does not control his rate.  With regard to coronary artery disease, in spite of A-fib with RVR, no significant ischemic EKG changes.  Also in view of underlying medical comorbidity, I would have a very high threshold to perform any invasive procedures.  I would like to repeat echocardiogram to reevaluate his LV systolic function and also follow-up on the vegetation noted on the pacemaker leads.  His pacemaker has not been transmitting as he has had issues with his transmitter and this is being replaced.  We will also schedule him for an office visit pacemaker check soon.  I discussed with him regarding complete cessation of any tobacco product use, he is presently smoking about 2 to 3 cigars a day.  Office visit in 4 to 6 weeks.  Complex 40-minute office visit encounter.  His  external labs were reviewed.  Answered all the questions that Alexander [name] his daughter had.   Adrian Prows, MD, [facility] 08/09/2022, 11:26 AM Office: [phone] Fax: [phone] Pager: [phone]

## 2022-08-10 DIAGNOSIS — N186 End stage renal disease: Secondary | ICD-10-CM | POA: Diagnosis not present

## 2022-08-10 DIAGNOSIS — D509 Iron deficiency anemia, unspecified: Secondary | ICD-10-CM | POA: Diagnosis not present

## 2022-08-10 DIAGNOSIS — D631 Anemia in chronic kidney disease: Secondary | ICD-10-CM | POA: Diagnosis not present

## 2022-08-10 DIAGNOSIS — N2581 Secondary hyperparathyroidism of renal origin: Secondary | ICD-10-CM | POA: Diagnosis not present

## 2022-08-10 DIAGNOSIS — K31811 Angiodysplasia of stomach and duodenum with bleeding: Secondary | ICD-10-CM | POA: Diagnosis not present

## 2022-08-10 DIAGNOSIS — Z992 Dependence on renal dialysis: Secondary | ICD-10-CM | POA: Diagnosis not present

## 2022-08-11 DIAGNOSIS — N186 End stage renal disease: Secondary | ICD-10-CM | POA: Diagnosis not present

## 2022-08-11 DIAGNOSIS — E1122 Type 2 diabetes mellitus with diabetic chronic kidney disease: Secondary | ICD-10-CM | POA: Diagnosis not present

## 2022-08-11 DIAGNOSIS — Z992 Dependence on renal dialysis: Secondary | ICD-10-CM | POA: Diagnosis not present

## 2022-08-13 DIAGNOSIS — Z23 Encounter for immunization: Secondary | ICD-10-CM | POA: Diagnosis not present

## 2022-08-13 DIAGNOSIS — D509 Iron deficiency anemia, unspecified: Secondary | ICD-10-CM | POA: Diagnosis not present

## 2022-08-13 DIAGNOSIS — K31811 Angiodysplasia of stomach and duodenum with bleeding: Secondary | ICD-10-CM | POA: Diagnosis not present

## 2022-08-13 DIAGNOSIS — Z992 Dependence on renal dialysis: Secondary | ICD-10-CM | POA: Diagnosis not present

## 2022-08-13 DIAGNOSIS — N186 End stage renal disease: Secondary | ICD-10-CM | POA: Diagnosis not present

## 2022-08-13 DIAGNOSIS — N2581 Secondary hyperparathyroidism of renal origin: Secondary | ICD-10-CM | POA: Diagnosis not present

## 2022-08-13 DIAGNOSIS — D631 Anemia in chronic kidney disease: Secondary | ICD-10-CM | POA: Diagnosis not present

## 2022-08-15 DIAGNOSIS — K31811 Angiodysplasia of stomach and duodenum with bleeding: Secondary | ICD-10-CM | POA: Diagnosis not present

## 2022-08-15 DIAGNOSIS — D631 Anemia in chronic kidney disease: Secondary | ICD-10-CM | POA: Diagnosis not present

## 2022-08-15 DIAGNOSIS — N186 End stage renal disease: Secondary | ICD-10-CM | POA: Diagnosis not present

## 2022-08-15 DIAGNOSIS — Z992 Dependence on renal dialysis: Secondary | ICD-10-CM | POA: Diagnosis not present

## 2022-08-15 DIAGNOSIS — N2581 Secondary hyperparathyroidism of renal origin: Secondary | ICD-10-CM | POA: Diagnosis not present

## 2022-08-15 DIAGNOSIS — Z23 Encounter for immunization: Secondary | ICD-10-CM | POA: Diagnosis not present

## 2022-08-15 DIAGNOSIS — D509 Iron deficiency anemia, unspecified: Secondary | ICD-10-CM | POA: Diagnosis not present

## 2022-08-16 ENCOUNTER — Other Ambulatory Visit: Payer: Self-pay | Admitting: Cardiology

## 2022-08-16 DIAGNOSIS — I10 Essential (primary) hypertension: Secondary | ICD-10-CM

## 2022-08-16 DIAGNOSIS — N186 End stage renal disease: Secondary | ICD-10-CM | POA: Diagnosis not present

## 2022-08-16 DIAGNOSIS — Z992 Dependence on renal dialysis: Secondary | ICD-10-CM | POA: Diagnosis not present

## 2022-08-16 DIAGNOSIS — I480 Paroxysmal atrial fibrillation: Secondary | ICD-10-CM

## 2022-08-16 DIAGNOSIS — E1122 Type 2 diabetes mellitus with diabetic chronic kidney disease: Secondary | ICD-10-CM | POA: Diagnosis not present

## 2022-08-16 DIAGNOSIS — I509 Heart failure, unspecified: Secondary | ICD-10-CM | POA: Diagnosis not present

## 2022-08-17 DIAGNOSIS — Z23 Encounter for immunization: Secondary | ICD-10-CM | POA: Diagnosis not present

## 2022-08-17 DIAGNOSIS — Z992 Dependence on renal dialysis: Secondary | ICD-10-CM | POA: Diagnosis not present

## 2022-08-17 DIAGNOSIS — K31811 Angiodysplasia of stomach and duodenum with bleeding: Secondary | ICD-10-CM | POA: Diagnosis not present

## 2022-08-17 DIAGNOSIS — D631 Anemia in chronic kidney disease: Secondary | ICD-10-CM | POA: Diagnosis not present

## 2022-08-17 DIAGNOSIS — N186 End stage renal disease: Secondary | ICD-10-CM | POA: Diagnosis not present

## 2022-08-17 DIAGNOSIS — D509 Iron deficiency anemia, unspecified: Secondary | ICD-10-CM | POA: Diagnosis not present

## 2022-08-17 DIAGNOSIS — N2581 Secondary hyperparathyroidism of renal origin: Secondary | ICD-10-CM | POA: Diagnosis not present

## 2022-08-20 DIAGNOSIS — K31811 Angiodysplasia of stomach and duodenum with bleeding: Secondary | ICD-10-CM | POA: Diagnosis not present

## 2022-08-20 DIAGNOSIS — D631 Anemia in chronic kidney disease: Secondary | ICD-10-CM | POA: Diagnosis not present

## 2022-08-20 DIAGNOSIS — Z23 Encounter for immunization: Secondary | ICD-10-CM | POA: Diagnosis not present

## 2022-08-20 DIAGNOSIS — D509 Iron deficiency anemia, unspecified: Secondary | ICD-10-CM | POA: Diagnosis not present

## 2022-08-20 DIAGNOSIS — N186 End stage renal disease: Secondary | ICD-10-CM | POA: Diagnosis not present

## 2022-08-20 DIAGNOSIS — N2581 Secondary hyperparathyroidism of renal origin: Secondary | ICD-10-CM | POA: Diagnosis not present

## 2022-08-20 DIAGNOSIS — Z992 Dependence on renal dialysis: Secondary | ICD-10-CM | POA: Diagnosis not present

## 2022-08-22 DIAGNOSIS — D631 Anemia in chronic kidney disease: Secondary | ICD-10-CM | POA: Diagnosis not present

## 2022-08-22 DIAGNOSIS — Z23 Encounter for immunization: Secondary | ICD-10-CM | POA: Diagnosis not present

## 2022-08-22 DIAGNOSIS — N186 End stage renal disease: Secondary | ICD-10-CM | POA: Diagnosis not present

## 2022-08-22 DIAGNOSIS — K31811 Angiodysplasia of stomach and duodenum with bleeding: Secondary | ICD-10-CM | POA: Diagnosis not present

## 2022-08-22 DIAGNOSIS — Z992 Dependence on renal dialysis: Secondary | ICD-10-CM | POA: Diagnosis not present

## 2022-08-22 DIAGNOSIS — N2581 Secondary hyperparathyroidism of renal origin: Secondary | ICD-10-CM | POA: Diagnosis not present

## 2022-08-22 DIAGNOSIS — D509 Iron deficiency anemia, unspecified: Secondary | ICD-10-CM | POA: Diagnosis not present

## 2022-08-24 DIAGNOSIS — Z23 Encounter for immunization: Secondary | ICD-10-CM | POA: Diagnosis not present

## 2022-08-24 DIAGNOSIS — N186 End stage renal disease: Secondary | ICD-10-CM | POA: Diagnosis not present

## 2022-08-24 DIAGNOSIS — I5022 Chronic systolic (congestive) heart failure: Secondary | ICD-10-CM | POA: Diagnosis not present

## 2022-08-24 DIAGNOSIS — N2581 Secondary hyperparathyroidism of renal origin: Secondary | ICD-10-CM | POA: Diagnosis not present

## 2022-08-24 DIAGNOSIS — D631 Anemia in chronic kidney disease: Secondary | ICD-10-CM | POA: Diagnosis not present

## 2022-08-24 DIAGNOSIS — D509 Iron deficiency anemia, unspecified: Secondary | ICD-10-CM | POA: Diagnosis not present

## 2022-08-24 DIAGNOSIS — Z992 Dependence on renal dialysis: Secondary | ICD-10-CM | POA: Diagnosis not present

## 2022-08-24 DIAGNOSIS — K31811 Angiodysplasia of stomach and duodenum with bleeding: Secondary | ICD-10-CM | POA: Diagnosis not present

## 2022-08-27 DIAGNOSIS — N2581 Secondary hyperparathyroidism of renal origin: Secondary | ICD-10-CM | POA: Diagnosis not present

## 2022-08-27 DIAGNOSIS — K31811 Angiodysplasia of stomach and duodenum with bleeding: Secondary | ICD-10-CM | POA: Diagnosis not present

## 2022-08-27 DIAGNOSIS — D509 Iron deficiency anemia, unspecified: Secondary | ICD-10-CM | POA: Diagnosis not present

## 2022-08-27 DIAGNOSIS — N186 End stage renal disease: Secondary | ICD-10-CM | POA: Diagnosis not present

## 2022-08-27 DIAGNOSIS — D631 Anemia in chronic kidney disease: Secondary | ICD-10-CM | POA: Diagnosis not present

## 2022-08-27 DIAGNOSIS — Z23 Encounter for immunization: Secondary | ICD-10-CM | POA: Diagnosis not present

## 2022-08-27 DIAGNOSIS — Z992 Dependence on renal dialysis: Secondary | ICD-10-CM | POA: Diagnosis not present

## 2022-08-29 DIAGNOSIS — D509 Iron deficiency anemia, unspecified: Secondary | ICD-10-CM | POA: Diagnosis not present

## 2022-08-29 DIAGNOSIS — K31811 Angiodysplasia of stomach and duodenum with bleeding: Secondary | ICD-10-CM | POA: Diagnosis not present

## 2022-08-29 DIAGNOSIS — N186 End stage renal disease: Secondary | ICD-10-CM | POA: Diagnosis not present

## 2022-08-29 DIAGNOSIS — D631 Anemia in chronic kidney disease: Secondary | ICD-10-CM | POA: Diagnosis not present

## 2022-08-29 DIAGNOSIS — N2581 Secondary hyperparathyroidism of renal origin: Secondary | ICD-10-CM | POA: Diagnosis not present

## 2022-08-29 DIAGNOSIS — Z992 Dependence on renal dialysis: Secondary | ICD-10-CM | POA: Diagnosis not present

## 2022-08-29 DIAGNOSIS — Z23 Encounter for immunization: Secondary | ICD-10-CM | POA: Diagnosis not present

## 2022-08-31 DIAGNOSIS — Z992 Dependence on renal dialysis: Secondary | ICD-10-CM | POA: Diagnosis not present

## 2022-08-31 DIAGNOSIS — D631 Anemia in chronic kidney disease: Secondary | ICD-10-CM | POA: Diagnosis not present

## 2022-08-31 DIAGNOSIS — N186 End stage renal disease: Secondary | ICD-10-CM | POA: Diagnosis not present

## 2022-08-31 DIAGNOSIS — N2581 Secondary hyperparathyroidism of renal origin: Secondary | ICD-10-CM | POA: Diagnosis not present

## 2022-08-31 DIAGNOSIS — Z23 Encounter for immunization: Secondary | ICD-10-CM | POA: Diagnosis not present

## 2022-08-31 DIAGNOSIS — D509 Iron deficiency anemia, unspecified: Secondary | ICD-10-CM | POA: Diagnosis not present

## 2022-08-31 DIAGNOSIS — K31811 Angiodysplasia of stomach and duodenum with bleeding: Secondary | ICD-10-CM | POA: Diagnosis not present

## 2022-09-03 DIAGNOSIS — N186 End stage renal disease: Secondary | ICD-10-CM | POA: Diagnosis not present

## 2022-09-03 DIAGNOSIS — D631 Anemia in chronic kidney disease: Secondary | ICD-10-CM | POA: Diagnosis not present

## 2022-09-03 DIAGNOSIS — Z992 Dependence on renal dialysis: Secondary | ICD-10-CM | POA: Diagnosis not present

## 2022-09-03 DIAGNOSIS — K31811 Angiodysplasia of stomach and duodenum with bleeding: Secondary | ICD-10-CM | POA: Diagnosis not present

## 2022-09-03 DIAGNOSIS — N2581 Secondary hyperparathyroidism of renal origin: Secondary | ICD-10-CM | POA: Diagnosis not present

## 2022-09-03 DIAGNOSIS — D509 Iron deficiency anemia, unspecified: Secondary | ICD-10-CM | POA: Diagnosis not present

## 2022-09-03 DIAGNOSIS — Z23 Encounter for immunization: Secondary | ICD-10-CM | POA: Diagnosis not present

## 2022-09-05 DIAGNOSIS — E1142 Type 2 diabetes mellitus with diabetic polyneuropathy: Secondary | ICD-10-CM | POA: Diagnosis not present

## 2022-09-05 DIAGNOSIS — E1122 Type 2 diabetes mellitus with diabetic chronic kidney disease: Secondary | ICD-10-CM | POA: Diagnosis not present

## 2022-09-05 DIAGNOSIS — K31811 Angiodysplasia of stomach and duodenum with bleeding: Secondary | ICD-10-CM | POA: Diagnosis not present

## 2022-09-05 DIAGNOSIS — I120 Hypertensive chronic kidney disease with stage 5 chronic kidney disease or end stage renal disease: Secondary | ICD-10-CM | POA: Diagnosis not present

## 2022-09-05 DIAGNOSIS — N186 End stage renal disease: Secondary | ICD-10-CM | POA: Diagnosis not present

## 2022-09-05 DIAGNOSIS — Z992 Dependence on renal dialysis: Secondary | ICD-10-CM | POA: Diagnosis not present

## 2022-09-05 DIAGNOSIS — Z23 Encounter for immunization: Secondary | ICD-10-CM | POA: Diagnosis not present

## 2022-09-05 DIAGNOSIS — D509 Iron deficiency anemia, unspecified: Secondary | ICD-10-CM | POA: Diagnosis not present

## 2022-09-05 DIAGNOSIS — N2581 Secondary hyperparathyroidism of renal origin: Secondary | ICD-10-CM | POA: Diagnosis not present

## 2022-09-05 DIAGNOSIS — D631 Anemia in chronic kidney disease: Secondary | ICD-10-CM | POA: Diagnosis not present

## 2022-09-06 ENCOUNTER — Ambulatory Visit: Payer: Medicare Other

## 2022-09-06 DIAGNOSIS — I39 Endocarditis and heart valve disorders in diseases classified elsewhere: Secondary | ICD-10-CM | POA: Diagnosis not present

## 2022-09-06 DIAGNOSIS — Z950 Presence of cardiac pacemaker: Secondary | ICD-10-CM | POA: Diagnosis not present

## 2022-09-06 DIAGNOSIS — I480 Paroxysmal atrial fibrillation: Secondary | ICD-10-CM | POA: Diagnosis not present

## 2022-09-07 DIAGNOSIS — K31811 Angiodysplasia of stomach and duodenum with bleeding: Secondary | ICD-10-CM | POA: Diagnosis not present

## 2022-09-07 DIAGNOSIS — D631 Anemia in chronic kidney disease: Secondary | ICD-10-CM | POA: Diagnosis not present

## 2022-09-07 DIAGNOSIS — D509 Iron deficiency anemia, unspecified: Secondary | ICD-10-CM | POA: Diagnosis not present

## 2022-09-07 DIAGNOSIS — N186 End stage renal disease: Secondary | ICD-10-CM | POA: Diagnosis not present

## 2022-09-07 DIAGNOSIS — Z23 Encounter for immunization: Secondary | ICD-10-CM | POA: Diagnosis not present

## 2022-09-07 DIAGNOSIS — N2581 Secondary hyperparathyroidism of renal origin: Secondary | ICD-10-CM | POA: Diagnosis not present

## 2022-09-07 DIAGNOSIS — Z992 Dependence on renal dialysis: Secondary | ICD-10-CM | POA: Diagnosis not present

## 2022-09-10 DIAGNOSIS — D509 Iron deficiency anemia, unspecified: Secondary | ICD-10-CM | POA: Diagnosis not present

## 2022-09-10 DIAGNOSIS — Z23 Encounter for immunization: Secondary | ICD-10-CM | POA: Diagnosis not present

## 2022-09-10 DIAGNOSIS — K31811 Angiodysplasia of stomach and duodenum with bleeding: Secondary | ICD-10-CM | POA: Diagnosis not present

## 2022-09-10 DIAGNOSIS — N186 End stage renal disease: Secondary | ICD-10-CM | POA: Diagnosis not present

## 2022-09-10 DIAGNOSIS — Z992 Dependence on renal dialysis: Secondary | ICD-10-CM | POA: Diagnosis not present

## 2022-09-10 DIAGNOSIS — N2581 Secondary hyperparathyroidism of renal origin: Secondary | ICD-10-CM | POA: Diagnosis not present

## 2022-09-10 DIAGNOSIS — D631 Anemia in chronic kidney disease: Secondary | ICD-10-CM | POA: Diagnosis not present

## 2022-09-11 DIAGNOSIS — N186 End stage renal disease: Secondary | ICD-10-CM | POA: Diagnosis not present

## 2022-09-11 DIAGNOSIS — E1122 Type 2 diabetes mellitus with diabetic chronic kidney disease: Secondary | ICD-10-CM | POA: Diagnosis not present

## 2022-09-11 DIAGNOSIS — Z992 Dependence on renal dialysis: Secondary | ICD-10-CM | POA: Diagnosis not present

## 2022-09-12 DIAGNOSIS — K31811 Angiodysplasia of stomach and duodenum with bleeding: Secondary | ICD-10-CM | POA: Diagnosis not present

## 2022-09-12 DIAGNOSIS — Z992 Dependence on renal dialysis: Secondary | ICD-10-CM | POA: Diagnosis not present

## 2022-09-12 DIAGNOSIS — D631 Anemia in chronic kidney disease: Secondary | ICD-10-CM | POA: Diagnosis not present

## 2022-09-12 DIAGNOSIS — N186 End stage renal disease: Secondary | ICD-10-CM | POA: Diagnosis not present

## 2022-09-12 DIAGNOSIS — N2581 Secondary hyperparathyroidism of renal origin: Secondary | ICD-10-CM | POA: Diagnosis not present

## 2022-09-12 DIAGNOSIS — D509 Iron deficiency anemia, unspecified: Secondary | ICD-10-CM | POA: Diagnosis not present

## 2022-09-13 DIAGNOSIS — N186 End stage renal disease: Secondary | ICD-10-CM | POA: Diagnosis not present

## 2022-09-13 DIAGNOSIS — I2510 Atherosclerotic heart disease of native coronary artery without angina pectoris: Secondary | ICD-10-CM | POA: Diagnosis not present

## 2022-09-13 DIAGNOSIS — G8929 Other chronic pain: Secondary | ICD-10-CM | POA: Diagnosis not present

## 2022-09-13 DIAGNOSIS — E785 Hyperlipidemia, unspecified: Secondary | ICD-10-CM | POA: Diagnosis not present

## 2022-09-14 DIAGNOSIS — D509 Iron deficiency anemia, unspecified: Secondary | ICD-10-CM | POA: Diagnosis not present

## 2022-09-14 DIAGNOSIS — D631 Anemia in chronic kidney disease: Secondary | ICD-10-CM | POA: Diagnosis not present

## 2022-09-14 DIAGNOSIS — K31811 Angiodysplasia of stomach and duodenum with bleeding: Secondary | ICD-10-CM | POA: Diagnosis not present

## 2022-09-14 DIAGNOSIS — N186 End stage renal disease: Secondary | ICD-10-CM | POA: Diagnosis not present

## 2022-09-14 DIAGNOSIS — N2581 Secondary hyperparathyroidism of renal origin: Secondary | ICD-10-CM | POA: Diagnosis not present

## 2022-09-14 DIAGNOSIS — Z992 Dependence on renal dialysis: Secondary | ICD-10-CM | POA: Diagnosis not present

## 2022-09-17 DIAGNOSIS — K31811 Angiodysplasia of stomach and duodenum with bleeding: Secondary | ICD-10-CM | POA: Diagnosis not present

## 2022-09-17 DIAGNOSIS — Z992 Dependence on renal dialysis: Secondary | ICD-10-CM | POA: Diagnosis not present

## 2022-09-17 DIAGNOSIS — D631 Anemia in chronic kidney disease: Secondary | ICD-10-CM | POA: Diagnosis not present

## 2022-09-17 DIAGNOSIS — N186 End stage renal disease: Secondary | ICD-10-CM | POA: Diagnosis not present

## 2022-09-17 DIAGNOSIS — D509 Iron deficiency anemia, unspecified: Secondary | ICD-10-CM | POA: Diagnosis not present

## 2022-09-17 DIAGNOSIS — N2581 Secondary hyperparathyroidism of renal origin: Secondary | ICD-10-CM | POA: Diagnosis not present

## 2022-09-19 DIAGNOSIS — N2581 Secondary hyperparathyroidism of renal origin: Secondary | ICD-10-CM | POA: Diagnosis not present

## 2022-09-19 DIAGNOSIS — D631 Anemia in chronic kidney disease: Secondary | ICD-10-CM | POA: Diagnosis not present

## 2022-09-19 DIAGNOSIS — Z992 Dependence on renal dialysis: Secondary | ICD-10-CM | POA: Diagnosis not present

## 2022-09-19 DIAGNOSIS — K31811 Angiodysplasia of stomach and duodenum with bleeding: Secondary | ICD-10-CM | POA: Diagnosis not present

## 2022-09-19 DIAGNOSIS — D509 Iron deficiency anemia, unspecified: Secondary | ICD-10-CM | POA: Diagnosis not present

## 2022-09-19 DIAGNOSIS — N186 End stage renal disease: Secondary | ICD-10-CM | POA: Diagnosis not present

## 2022-09-21 DIAGNOSIS — Z992 Dependence on renal dialysis: Secondary | ICD-10-CM | POA: Diagnosis not present

## 2022-09-21 DIAGNOSIS — N186 End stage renal disease: Secondary | ICD-10-CM | POA: Diagnosis not present

## 2022-09-21 DIAGNOSIS — N2581 Secondary hyperparathyroidism of renal origin: Secondary | ICD-10-CM | POA: Diagnosis not present

## 2022-09-21 DIAGNOSIS — D509 Iron deficiency anemia, unspecified: Secondary | ICD-10-CM | POA: Diagnosis not present

## 2022-09-21 DIAGNOSIS — K31811 Angiodysplasia of stomach and duodenum with bleeding: Secondary | ICD-10-CM | POA: Diagnosis not present

## 2022-09-21 DIAGNOSIS — D631 Anemia in chronic kidney disease: Secondary | ICD-10-CM | POA: Diagnosis not present

## 2022-09-24 DIAGNOSIS — N2581 Secondary hyperparathyroidism of renal origin: Secondary | ICD-10-CM | POA: Diagnosis not present

## 2022-09-24 DIAGNOSIS — N186 End stage renal disease: Secondary | ICD-10-CM | POA: Diagnosis not present

## 2022-09-24 DIAGNOSIS — I5022 Chronic systolic (congestive) heart failure: Secondary | ICD-10-CM | POA: Diagnosis not present

## 2022-09-24 DIAGNOSIS — D631 Anemia in chronic kidney disease: Secondary | ICD-10-CM | POA: Diagnosis not present

## 2022-09-24 DIAGNOSIS — K31811 Angiodysplasia of stomach and duodenum with bleeding: Secondary | ICD-10-CM | POA: Diagnosis not present

## 2022-09-24 DIAGNOSIS — Z992 Dependence on renal dialysis: Secondary | ICD-10-CM | POA: Diagnosis not present

## 2022-09-24 DIAGNOSIS — D509 Iron deficiency anemia, unspecified: Secondary | ICD-10-CM | POA: Diagnosis not present

## 2022-09-26 DIAGNOSIS — D509 Iron deficiency anemia, unspecified: Secondary | ICD-10-CM | POA: Diagnosis not present

## 2022-09-26 DIAGNOSIS — Z992 Dependence on renal dialysis: Secondary | ICD-10-CM | POA: Diagnosis not present

## 2022-09-26 DIAGNOSIS — D631 Anemia in chronic kidney disease: Secondary | ICD-10-CM | POA: Diagnosis not present

## 2022-09-26 DIAGNOSIS — K31811 Angiodysplasia of stomach and duodenum with bleeding: Secondary | ICD-10-CM | POA: Diagnosis not present

## 2022-09-26 DIAGNOSIS — N2581 Secondary hyperparathyroidism of renal origin: Secondary | ICD-10-CM | POA: Diagnosis not present

## 2022-09-26 DIAGNOSIS — N186 End stage renal disease: Secondary | ICD-10-CM | POA: Diagnosis not present

## 2022-09-27 ENCOUNTER — Ambulatory Visit: Payer: Medicare Other | Admitting: Cardiology

## 2022-09-28 DIAGNOSIS — N2581 Secondary hyperparathyroidism of renal origin: Secondary | ICD-10-CM | POA: Diagnosis not present

## 2022-09-28 DIAGNOSIS — K31811 Angiodysplasia of stomach and duodenum with bleeding: Secondary | ICD-10-CM | POA: Diagnosis not present

## 2022-09-28 DIAGNOSIS — D631 Anemia in chronic kidney disease: Secondary | ICD-10-CM | POA: Diagnosis not present

## 2022-09-28 DIAGNOSIS — N186 End stage renal disease: Secondary | ICD-10-CM | POA: Diagnosis not present

## 2022-09-28 DIAGNOSIS — D509 Iron deficiency anemia, unspecified: Secondary | ICD-10-CM | POA: Diagnosis not present

## 2022-09-28 DIAGNOSIS — Z992 Dependence on renal dialysis: Secondary | ICD-10-CM | POA: Diagnosis not present

## 2022-09-30 DIAGNOSIS — N2581 Secondary hyperparathyroidism of renal origin: Secondary | ICD-10-CM | POA: Diagnosis not present

## 2022-09-30 DIAGNOSIS — N186 End stage renal disease: Secondary | ICD-10-CM | POA: Diagnosis not present

## 2022-09-30 DIAGNOSIS — Z992 Dependence on renal dialysis: Secondary | ICD-10-CM | POA: Diagnosis not present

## 2022-09-30 DIAGNOSIS — D509 Iron deficiency anemia, unspecified: Secondary | ICD-10-CM | POA: Diagnosis not present

## 2022-09-30 DIAGNOSIS — K31811 Angiodysplasia of stomach and duodenum with bleeding: Secondary | ICD-10-CM | POA: Diagnosis not present

## 2022-09-30 DIAGNOSIS — D631 Anemia in chronic kidney disease: Secondary | ICD-10-CM | POA: Diagnosis not present

## 2022-10-02 DIAGNOSIS — D509 Iron deficiency anemia, unspecified: Secondary | ICD-10-CM | POA: Diagnosis not present

## 2022-10-02 DIAGNOSIS — D631 Anemia in chronic kidney disease: Secondary | ICD-10-CM | POA: Diagnosis not present

## 2022-10-02 DIAGNOSIS — Z992 Dependence on renal dialysis: Secondary | ICD-10-CM | POA: Diagnosis not present

## 2022-10-02 DIAGNOSIS — K31811 Angiodysplasia of stomach and duodenum with bleeding: Secondary | ICD-10-CM | POA: Diagnosis not present

## 2022-10-02 DIAGNOSIS — N2581 Secondary hyperparathyroidism of renal origin: Secondary | ICD-10-CM | POA: Diagnosis not present

## 2022-10-02 DIAGNOSIS — N186 End stage renal disease: Secondary | ICD-10-CM | POA: Diagnosis not present

## 2022-10-05 DIAGNOSIS — D631 Anemia in chronic kidney disease: Secondary | ICD-10-CM | POA: Diagnosis not present

## 2022-10-05 DIAGNOSIS — N186 End stage renal disease: Secondary | ICD-10-CM | POA: Diagnosis not present

## 2022-10-05 DIAGNOSIS — N2581 Secondary hyperparathyroidism of renal origin: Secondary | ICD-10-CM | POA: Diagnosis not present

## 2022-10-05 DIAGNOSIS — K31811 Angiodysplasia of stomach and duodenum with bleeding: Secondary | ICD-10-CM | POA: Diagnosis not present

## 2022-10-05 DIAGNOSIS — D509 Iron deficiency anemia, unspecified: Secondary | ICD-10-CM | POA: Diagnosis not present

## 2022-10-05 DIAGNOSIS — Z992 Dependence on renal dialysis: Secondary | ICD-10-CM | POA: Diagnosis not present

## 2022-10-08 DIAGNOSIS — N186 End stage renal disease: Secondary | ICD-10-CM | POA: Diagnosis not present

## 2022-10-08 DIAGNOSIS — D509 Iron deficiency anemia, unspecified: Secondary | ICD-10-CM | POA: Diagnosis not present

## 2022-10-08 DIAGNOSIS — Z992 Dependence on renal dialysis: Secondary | ICD-10-CM | POA: Diagnosis not present

## 2022-10-08 DIAGNOSIS — K31811 Angiodysplasia of stomach and duodenum with bleeding: Secondary | ICD-10-CM | POA: Diagnosis not present

## 2022-10-08 DIAGNOSIS — N2581 Secondary hyperparathyroidism of renal origin: Secondary | ICD-10-CM | POA: Diagnosis not present

## 2022-10-08 DIAGNOSIS — D631 Anemia in chronic kidney disease: Secondary | ICD-10-CM | POA: Diagnosis not present

## 2022-10-09 ENCOUNTER — Ambulatory Visit: Payer: Medicare Other | Admitting: Cardiology

## 2022-10-09 ENCOUNTER — Encounter: Payer: Self-pay | Admitting: Cardiology

## 2022-10-09 VITALS — BP 134/71 | HR 74 | Resp 16 | Ht 72.0 in | Wt 186.0 lb

## 2022-10-09 DIAGNOSIS — Z8679 Personal history of other diseases of the circulatory system: Secondary | ICD-10-CM | POA: Diagnosis not present

## 2022-10-09 DIAGNOSIS — I739 Peripheral vascular disease, unspecified: Secondary | ICD-10-CM | POA: Diagnosis not present

## 2022-10-09 DIAGNOSIS — Z950 Presence of cardiac pacemaker: Secondary | ICD-10-CM | POA: Diagnosis not present

## 2022-10-09 DIAGNOSIS — I480 Paroxysmal atrial fibrillation: Secondary | ICD-10-CM

## 2022-10-09 NOTE — Progress Notes (Signed)
Alexander [name] Date of Birth: [birth year] MRN: [id number] Primary Care Provider:Tisovec, [name], MD Primary Cardiologist: Alexander Prows, MD,  Chief Complaint  Patient presents with   Atrial Fibrillation   Congestive Heart Failure   Coronary Artery Disease   Follow-up    4 weeks    HPI  Alexander [name] is a 61 y.o. Caucasian male patient with hypertension, hyperlipidemia, diabetes mellitus with end-stage renal disease on hemodialysis, coronary artery disease with atherectomy followed by stenting to LAD in 2021 and to the distal circumflex in 2015, sick sinus syndrome and also complete heart block and paroxysmal atrial fibrillation SP permanent Medtronic pacemaker implantation in 2015 , severe PAD leading to eventual right BKA and left AKA, has chronic endocarditis involving pacemaker wire and had a prolonged hospitalization in December 2022 which is now resolved clinically and by echo in Nov 2023.  He now presents for routine visit at 2 months.  He is presently doing well and feels the best he has in quite a while.  Since bilateral leg amputations, his health is improved significantly.  No fever or chills. He has no open wounds.  His daughter Alexander [name] present at bedside.     PAST MEDICAL HISTORY: Past Medical History:  Diagnosis Date   Acute blood loss anemia    Alcohol abuse    Anemia    Anemia of chronic disease    Arthritis    "patient does not think so."   Atrial fibrillation ([place])    Cardiac syncope 10/07/2014   rapid A fib with 8 sec pauses on converison with syncope- temp pacing wire placed then PPM   Cataract    BILATERAL   Coronary artery disease    with stents   Diabetes mellitus    dx---been  awhile   Elevated LFTs    Endocarditis of tricuspid valve    ESRD (end stage renal disease) ([place])    M/W/F   Gangrene of right foot (HCC)    GERD (gastroesophageal reflux disease)    History of blood transfusion    Hyperlipemia    Hypertension    Malnutrition ([place])     Osteomyelitis ([place])    right foot   Osteomyelitis of left foot ([place]) 08/19/2013   Peripheral vascular disease (HCC)    Pneumonia    Presence of permanent cardiac pacemaker 10/08/2014   Medtronic   FAMILY HISTORY: The patient's family history includes Breast cancer in his sister; Diabetes in his brother, mother, and sister; Diabetes type II in his brother, mother, sister, and sister; Hypertension in his brother, mother, and sister; Kidney failure in his brother; Liver cancer in his father.   SOCIAL HISTORY:  Social History   Tobacco Use   Smoking status: Every Day    Types: Cigars    Passive exposure: Current   Smokeless tobacco: Never   Tobacco comments:    Smokes cigars daily, has not smoked cigarettes since 2014  Substance Use Topics   Alcohol use: Not Currently    Alcohol/week: 21.0 standard drinks of alcohol    Types: 21 Glasses of wine per week    Comment: 1/2 to a whole beer once a week as of 05/10/22    Review of Systems  Cardiovascular:  Negative for chest pain, dyspnea on exertion and leg swelling.   PHYSICAL EXAM:    10/09/2022   10:36 AM 08/09/2022   10:57 AM 08/09/2022   10:54 AM  Vitals with BMI  Height _0   _1   Weight 186 lbs  186 lbs  BMI 97.41  63.84  Systolic 536 468 032  Diastolic 71 76 88  Pulse 74 97 74   Physical Exam Neck:     Vascular: No carotid bruit or JVD.  Cardiovascular:     Rate and Rhythm: Normal rate and regular rhythm.     Pulses:           Right popliteal pulse not accessible and left popliteal pulse not accessible.     Heart sounds: Murmur heard.     Early systolic murmur is present with a grade of 2/6 at the apex.  Pulmonary:     Effort: Pulmonary effort is normal.     Breath sounds: Normal breath sounds.  Abdominal:     General: Bowel sounds are normal.     Palpations: Abdomen is soft.  Musculoskeletal:     Comments: Right AKA, Left BKA  Skin:    Capillary Refill: Capillary refill takes less than 2 seconds.     LABORATORY DATA:    Latest Ref Rng & Units 05/14/2022    1:14 PM 05/13/2022    1:04 AM 05/12/2022    1:41 AM  CBC  WBC 4.0 - 10.5 K/uL 9.0  8.8  8.6   Hemoglobin 13.0 - 17.0 g/dL 7.6  7.6  9.0   Hematocrit 39.0 - 52.0 % 25.0  25.6  28.6   Platelets 150 - 400 K/uL 122  126  117        Latest Ref Rng & Units 05/14/2022    1:14 PM 05/13/2022    1:04 AM 05/12/2022    1:41 AM  CMP  Glucose 70 - 99 mg/dL 137  105  174   BUN 8 - 23 mg/dL _0 Creatinine 0.61 - 1.24 mg/dL 3.43  2.60  3.38   Sodium 135 - 145 mmol/L 132  134  136   Potassium 3.5 - 5.1 mmol/L 3.6  3.2  3.4   Chloride 98 - 111 mmol/L 93  96  92   CO2 22 - 32 mmol/L _1 Calcium 8.9 - 10.3 mg/dL 8.1  8.0  8.2    Lab Results  Component Value Date   HGBA1C 6.6 (H) 05/11/2022   HGBA1C 6.8 (H) 10/29/2021   HGBA1C 5.5 05/23/2020   External Labs:   Cholesterol, total 114.000 m 10/17/2020 HDL 39.000 mg 10/17/2020 LDL 32.000 mg 10/17/2020 Triglycerides 214.000 m 10/17/2020  A1C 8.800 01/30/2021  10/06/2019: A1c 6.5%.  Serum glucose 136 mg, BUN 55, creatinine 2.6, eGFR 25/30 ML.  Potassium 5.4.  CMP otherwise normal.  HB 10.2/HCT 31.4, platelets 141.  Normal indicis.  Total cholesterol 95, triglycerides 98, HDL 34, LDL 41.  Non-HDL cholesterol 61.  PSA normal.  11/17/2020: BUN 32, creatinine 4.44, potassium 3.9, sodium 130  12/01/2020: Platelets 151, hemoglobin 9.6, hematocrit 28.4  Allergies   Allergies  Allergen Reactions   Cytoxan [Cyclophosphamide] Other (See Comments)    Pancytopenia   Pletal [Cilostazol] Palpitations and Other (See Comments)    "Can hear heart beating loudly". Other reaction(s): Unknown   Keflex [Cephalexin] Nausea And Vomiting    Other reaction(s): Unknown   Tylenol [Acetaminophen] Other (See Comments)    Patient has a cold sweat   Iron Sucrose     Other reaction(s): Unknown   Sulfa Antibiotics Rash    Other reaction(s): Unknown    Medications     Current Outpatient  Medications:  apixaban (ELIQUIS) 5 MG TABS tablet, Take 1 tablet (5 mg total) by mouth 2 (two) times daily., Disp: 180 tablet, Rfl: 3   atorvastatin (LIPITOR) 20 MG tablet, Take 1 tablet by mouth once daily, Disp: 90 tablet, Rfl: 0   blood glucose meter kit and supplies KIT, Check blood sugar TID & QHS, Disp: 1 each, Rfl: 0   Blood Glucose Monitoring Suppl (ACCU-CHEK ADVANTAGE DIABETES) kit, Use as instructed, Disp: 1 each, Rfl: 0   furosemide (LASIX) 80 MG tablet, Take 80 mg by mouth See admin instructions. Take 80 mg by mouth on (non-dialysis days) Tuesdays, Thursdays, Saturdays & Sundays in the morning., Disp: , Rfl:    gabapentin (NEURONTIN) 300 MG capsule, Take 1 capsule (300 mg total) by mouth 3 (three) times daily. 3 times a day when necessary neuropathy pain (Patient taking differently: Take 300 mg by mouth as needed. 3 times a day when necessary neuropathy pain), Disp: 90 capsule, Rfl: 3   glucose blood (ACCU-CHEK AVIVA PLUS) test strip, Use as instructed, Disp: 100 each, Rfl: 12   glucose blood (CHOICE DM FORA G20 TEST STRIPS) test strip, Check blood sugar TID & QHS, Disp: 100 each, Rfl: 12   glucose blood test strip, Use as instructed, Disp: 100 each, Rfl: 12   insulin NPH-regular Human (NOVOLIN 70/30) (70-30) 100 UNIT/ML injection, Inject 0-20 Units into the skin. Sliding scale, Disp: , Rfl:    metoprolol tartrate (LOPRESSOR) 25 MG tablet, Take 1 tablet by mouth twice daily, Disp: 180 tablet, Rfl: 0   oxyCODONE-acetaminophen (PERCOCET/ROXICET) 5-325 MG tablet, Take 1 tablet by mouth every 12 (twelve) hours as needed., Disp: 30 tablet, Rfl: 0   pantoprazole (PROTONIX) 40 MG tablet, Take 40 mg by mouth daily., Disp: , Rfl:    amiodarone (PACERONE) 200 MG tablet, Take 1 tablet (200 mg total) by mouth daily., Disp: 60 tablet, Rfl: 1   Cardiac Studies   Lexiscan myoview stress test 01/27/2016: 1. The resting electrocardiogram demonstrated normal sinus rhythm, normal resting conduction, no  resting arrhythmias and normal rest repolarization. Stress EKG is non-diagnostic for ischemia as it a pharmacologic stress using Lexiscan. Stress symptoms included dyspnea. 2. The perfusion imaging study demonstrates a moderate-sized defect in the inferior wall extending from the base towards the apex, the defect slightly improved in stress images, suggests soft tissue attenuation. However scar in this region could not excluded. Left ventricular systolic function Calculated by QGS was 72%. This is a low risk study. Clinical correlation is recommended.  Left Heart Catheterization 08/19/20:  Prior coronary angiogram: 11/09/2014: Diamondback CSI coronary atherectomy and stenting of the mid LAD-3.0 x 28 mm Promus Premier DES following. Distal dominant circumflex coronary artery following diamondback atherectomy and implantation of a 2.5 x 16 mm Promus Premier DES. Previously placed stents in LAD and CX patent. Mild to moderate diffuse disease in LAD, D1 and D2 and distal dominant Cx. RCA non dominant and severely diseased and small.   2015 mid LAD-3.0 x 28 mm Promus Premier DES in mid LAD and 2.5 x 16 mm Promus Premier DES in RI patent.  Peripheral arteriogram 01/26/2022: Abdominal angiogram reveals presence of 2 renal arteries 1 on either sides.  The kidneys are atrophic.  Abdominal aorta shows mild atherosclerotic changes and calcification.  Aortoiliac bifurcation is widely patent.  Right iliac artery and right common femoral artery are widely patent.    PCV ECHOCARDIOGRAM COMPLETE 09/06/2022  Narrative Echocardiogram 09/06/2022: Mildly depressed LV systolic function with visual EF 45-50%. Left ventricle size  is decreased. Severe concentric hypertrophy of the left ventricle. Hypokinetic global wall motion. Doppler evidence of grade II (pseudonormal) diastolic dysfunction, elevated LAP. Calculated EF 44%. Left atrial cavity is severely dilated at 48.15 ml/m^2. Right atrial cavity is moderately  dilated. Pacemaker lead/ICD lead noted in the RV. Structurally normal mitral valve.  Mild (Grade I) mitral regurgitation. Structurally normal tricuspid valve.  Mild tricuspid regurgitation. No evidence of pulmonary hypertension. Pericardium is normal. Trace pericardial effusion. IVC is dilated with respiratory variation. Compared to 01/20/2022, vegetations noted on the tricuspid valve and possibly pacemaker lead is no longer present.  Tricuspid regurgitation was severe previously.  Pacemaker   Pacemaker in situ: Medtronic advica DR (MRI compatible) dual-chamber pacemaker 10/07/2014 Remote dual-chamber pacemaker transmission 01/17/2022: AP 18%, VP 16%.  Longevity 1 year and 11 months.  Lead impedance and thresholds within normal limits.  There were frequent A-fib episodes, AT/AF burden 38%.  EKG   EKG 08/09/2022: Atrial fibrillation with rapid ventricular response at rate of 138 bpm, left axis deviation, left anterior fascicular block.  Right bundle branch block.  Nonspecific T abnormality.  EKG 12/07/2018: Sinus rhythm at a rate of 70 bpm.  Left axis, left anterior fascicular block.  Complete right bundle branch block.  Compared to EKG 04/22/2020, poor R wave progression not present.   Assessment and plan:      ICD-10-CM   1. Paroxysmal atrial fibrillation (HCC)  I48.0 amiodarone (PACERONE) 200 MG tablet    2. PAD (peripheral artery disease) (HCC)  I73.9     3. Pacemaker: Medtronic Advisa DR MRI [id number] Dual chamber pacemaker 10/08/2014  Z95.0     4. H/O bacterial endocarditis 10/17/2021: Resolved by TTE 09/2022  Z86.79       This patients CHA2DS2-VASc Score 3 (HTN, DM, vasc) and yearly risk of stroke 3.2%.   No orders of the defined types were placed in this encounter.   RECOMMENDATIONS:  Alexander [name] is a 61 y.o. Caucasian male patient with hypertension, hyperlipidemia, diabetes mellitus with end-stage renal disease on hemodialysis, coronary artery disease with atherectomy  followed by stenting to LAD in 2021 and to the distal circumflex in 2015, sick sinus syndrome and also complete heart block and paroxysmal atrial fibrillation SP permanent Medtronic pacemaker implantation in 2015 , severe PAD leading to eventual right BKA and left AKA, has chronic endocarditis involving pacemaker wire and had a prolonged hospitalization in December 2022 which is now resolved clinically and by echo in Nov 2023.  This is a 36-monthoffice visit.  1. Paroxysmal atrial fibrillation ([place] He is maintaining sinus rhythm, he is presently on Eliquis, continue the same.  As he is maintaining sinus rhythm, reduce the dose of amiodarone to 200 mg daily.  We could even consider increasing it to 100 mg daily.  2. PAD (peripheral artery disease) ([place] Patient is SP right AKA and left BKA and stumps have healed well since amputation, septic syndrome, poor wellbeing, has all essentially resolved.  Patient is presently feeling the best he has in quite a while.  3. Pacemaker: Medtronic Advisa DR MRI [id number] chamber pacemaker 10/08/2014 Patient pacemaker needs resetting of his transmitter, this will be arranged in 2 days.  Otherwise reviewed the echocardiogram, fortunately no further evidence of endocarditis.  4. H/O bacterial endocarditis 10/17/2021: Resolved by Echocardiogram 09/2022 Patient was diagnosed with endocarditis, has been on antibiotics since December 2022, he had no growth on blood cultures, as his tricuspid regurgitation has also now improved from severe to only very mild,  no vegetations noted on the echocardiogram, I would safely presume that it would be appropriate to discontinue antibiotics for now.  We would have a low threshold to restart antibiotics if he were to develop any septic syndrome.  Otherwise remained stable, I discussed with [name] regarding complete cessation of any tobacco product use, he is presently smoking about 2 to 3 cigars a day.  Office visit in 6 months.  This is  again a 40-minute office visit encounter.   Alexander Prows, MD, [facility] 10/09/2022, 11:13 AM Office: [phone] Fax: [phone] Pager: [phone]

## 2022-10-10 DIAGNOSIS — D509 Iron deficiency anemia, unspecified: Secondary | ICD-10-CM | POA: Diagnosis not present

## 2022-10-10 DIAGNOSIS — N2581 Secondary hyperparathyroidism of renal origin: Secondary | ICD-10-CM | POA: Diagnosis not present

## 2022-10-10 DIAGNOSIS — N186 End stage renal disease: Secondary | ICD-10-CM | POA: Diagnosis not present

## 2022-10-10 DIAGNOSIS — Z992 Dependence on renal dialysis: Secondary | ICD-10-CM | POA: Diagnosis not present

## 2022-10-10 DIAGNOSIS — K31811 Angiodysplasia of stomach and duodenum with bleeding: Secondary | ICD-10-CM | POA: Diagnosis not present

## 2022-10-10 DIAGNOSIS — D631 Anemia in chronic kidney disease: Secondary | ICD-10-CM | POA: Diagnosis not present

## 2022-10-10 NOTE — Progress Notes (Unsigned)
Chief Complaint  Patient presents with   Pacemaker Check   Encounter for care of pacemaker  Pacemaker: Medtronic Advisa DR MRI [id number] Dual chamber pacemaker 10/08/2014  Sinus node dysfunction (HCC)  Scheduled  In office pacemaker check 10/10/22  Single (S)/Dual (D)/BV: D. Presenting APVS @ 61/min. Pacemaker dependant:  Yes. Underlying sinus arrest. AP 18% (recent AP last 1 month as he is maintaining sinus. VP 60%. AMS Episodes Frequent.  AT/AF burden 82%  . Longest 4 hours. Latest 10/11/22 for 1 h 15 m. HVR 1211. Most AT/AF. One brief NSVT. Longevity 1.5 Years. Magnet rate: >85% Lead measurements: Stable. Histogram: Low (L)/normal (N)/high (H)  Normal. Patient activity Low.   Observations: Normal pacemaker . Changes: Turned on partial plus to eliminate far field R and turned down A lead sensitivity to from 0.30 to 0.15 to improve AF detection.  Remote dual-chamber pacemaker transmission 01/17/2022: AP 18%, VP 16%.  Longevity 1 year and 11 months.  Lead impedance and thresholds within normal limits.  There were frequent A-fib episodes, AT/AF burden 38%.

## 2022-10-11 ENCOUNTER — Ambulatory Visit: Payer: Medicare Other | Admitting: Cardiology

## 2022-10-11 DIAGNOSIS — Z45018 Encounter for adjustment and management of other part of cardiac pacemaker: Secondary | ICD-10-CM | POA: Diagnosis not present

## 2022-10-11 DIAGNOSIS — Z950 Presence of cardiac pacemaker: Secondary | ICD-10-CM

## 2022-10-11 DIAGNOSIS — I495 Sick sinus syndrome: Secondary | ICD-10-CM | POA: Diagnosis not present

## 2022-10-12 DIAGNOSIS — N2581 Secondary hyperparathyroidism of renal origin: Secondary | ICD-10-CM | POA: Diagnosis not present

## 2022-10-12 DIAGNOSIS — N186 End stage renal disease: Secondary | ICD-10-CM | POA: Diagnosis not present

## 2022-10-12 DIAGNOSIS — D631 Anemia in chronic kidney disease: Secondary | ICD-10-CM | POA: Diagnosis not present

## 2022-10-12 DIAGNOSIS — D509 Iron deficiency anemia, unspecified: Secondary | ICD-10-CM | POA: Diagnosis not present

## 2022-10-12 DIAGNOSIS — Z992 Dependence on renal dialysis: Secondary | ICD-10-CM | POA: Diagnosis not present

## 2022-10-12 DIAGNOSIS — K31811 Angiodysplasia of stomach and duodenum with bleeding: Secondary | ICD-10-CM | POA: Diagnosis not present

## 2022-10-15 DIAGNOSIS — Z992 Dependence on renal dialysis: Secondary | ICD-10-CM | POA: Diagnosis not present

## 2022-10-15 DIAGNOSIS — K31811 Angiodysplasia of stomach and duodenum with bleeding: Secondary | ICD-10-CM | POA: Diagnosis not present

## 2022-10-15 DIAGNOSIS — N186 End stage renal disease: Secondary | ICD-10-CM | POA: Diagnosis not present

## 2022-10-15 DIAGNOSIS — N2581 Secondary hyperparathyroidism of renal origin: Secondary | ICD-10-CM | POA: Diagnosis not present

## 2022-10-15 DIAGNOSIS — D509 Iron deficiency anemia, unspecified: Secondary | ICD-10-CM | POA: Diagnosis not present

## 2022-10-15 DIAGNOSIS — D631 Anemia in chronic kidney disease: Secondary | ICD-10-CM | POA: Diagnosis not present

## 2022-10-17 DIAGNOSIS — N2581 Secondary hyperparathyroidism of renal origin: Secondary | ICD-10-CM | POA: Diagnosis not present

## 2022-10-17 DIAGNOSIS — D631 Anemia in chronic kidney disease: Secondary | ICD-10-CM | POA: Diagnosis not present

## 2022-10-17 DIAGNOSIS — K31811 Angiodysplasia of stomach and duodenum with bleeding: Secondary | ICD-10-CM | POA: Diagnosis not present

## 2022-10-17 DIAGNOSIS — Z992 Dependence on renal dialysis: Secondary | ICD-10-CM | POA: Diagnosis not present

## 2022-10-17 DIAGNOSIS — D509 Iron deficiency anemia, unspecified: Secondary | ICD-10-CM | POA: Diagnosis not present

## 2022-10-17 DIAGNOSIS — N186 End stage renal disease: Secondary | ICD-10-CM | POA: Diagnosis not present

## 2022-10-19 DIAGNOSIS — D509 Iron deficiency anemia, unspecified: Secondary | ICD-10-CM | POA: Diagnosis not present

## 2022-10-19 DIAGNOSIS — K31811 Angiodysplasia of stomach and duodenum with bleeding: Secondary | ICD-10-CM | POA: Diagnosis not present

## 2022-10-19 DIAGNOSIS — D631 Anemia in chronic kidney disease: Secondary | ICD-10-CM | POA: Diagnosis not present

## 2022-10-19 DIAGNOSIS — N186 End stage renal disease: Secondary | ICD-10-CM | POA: Diagnosis not present

## 2022-10-19 DIAGNOSIS — Z992 Dependence on renal dialysis: Secondary | ICD-10-CM | POA: Diagnosis not present

## 2022-10-19 DIAGNOSIS — N2581 Secondary hyperparathyroidism of renal origin: Secondary | ICD-10-CM | POA: Diagnosis not present

## 2022-10-22 DIAGNOSIS — Z992 Dependence on renal dialysis: Secondary | ICD-10-CM | POA: Diagnosis not present

## 2022-10-22 DIAGNOSIS — D631 Anemia in chronic kidney disease: Secondary | ICD-10-CM | POA: Diagnosis not present

## 2022-10-22 DIAGNOSIS — D509 Iron deficiency anemia, unspecified: Secondary | ICD-10-CM | POA: Diagnosis not present

## 2022-10-22 DIAGNOSIS — K31811 Angiodysplasia of stomach and duodenum with bleeding: Secondary | ICD-10-CM | POA: Diagnosis not present

## 2022-10-22 DIAGNOSIS — N2581 Secondary hyperparathyroidism of renal origin: Secondary | ICD-10-CM | POA: Diagnosis not present

## 2022-10-22 DIAGNOSIS — N186 End stage renal disease: Secondary | ICD-10-CM | POA: Diagnosis not present

## 2022-10-23 DIAGNOSIS — E1122 Type 2 diabetes mellitus with diabetic chronic kidney disease: Secondary | ICD-10-CM | POA: Diagnosis not present

## 2022-10-23 DIAGNOSIS — I120 Hypertensive chronic kidney disease with stage 5 chronic kidney disease or end stage renal disease: Secondary | ICD-10-CM | POA: Diagnosis not present

## 2022-10-23 DIAGNOSIS — E1142 Type 2 diabetes mellitus with diabetic polyneuropathy: Secondary | ICD-10-CM | POA: Diagnosis not present

## 2022-10-23 DIAGNOSIS — N186 End stage renal disease: Secondary | ICD-10-CM | POA: Diagnosis not present

## 2022-10-24 DIAGNOSIS — D631 Anemia in chronic kidney disease: Secondary | ICD-10-CM | POA: Diagnosis not present

## 2022-10-24 DIAGNOSIS — Z992 Dependence on renal dialysis: Secondary | ICD-10-CM | POA: Diagnosis not present

## 2022-10-24 DIAGNOSIS — I5022 Chronic systolic (congestive) heart failure: Secondary | ICD-10-CM | POA: Diagnosis not present

## 2022-10-24 DIAGNOSIS — K31811 Angiodysplasia of stomach and duodenum with bleeding: Secondary | ICD-10-CM | POA: Diagnosis not present

## 2022-10-24 DIAGNOSIS — D509 Iron deficiency anemia, unspecified: Secondary | ICD-10-CM | POA: Diagnosis not present

## 2022-10-24 DIAGNOSIS — N186 End stage renal disease: Secondary | ICD-10-CM | POA: Diagnosis not present

## 2022-10-24 DIAGNOSIS — N2581 Secondary hyperparathyroidism of renal origin: Secondary | ICD-10-CM | POA: Diagnosis not present

## 2022-10-26 DIAGNOSIS — K31811 Angiodysplasia of stomach and duodenum with bleeding: Secondary | ICD-10-CM | POA: Diagnosis not present

## 2022-10-26 DIAGNOSIS — D509 Iron deficiency anemia, unspecified: Secondary | ICD-10-CM | POA: Diagnosis not present

## 2022-10-26 DIAGNOSIS — N186 End stage renal disease: Secondary | ICD-10-CM | POA: Diagnosis not present

## 2022-10-26 DIAGNOSIS — Z992 Dependence on renal dialysis: Secondary | ICD-10-CM | POA: Diagnosis not present

## 2022-10-26 DIAGNOSIS — N2581 Secondary hyperparathyroidism of renal origin: Secondary | ICD-10-CM | POA: Diagnosis not present

## 2022-10-26 DIAGNOSIS — D631 Anemia in chronic kidney disease: Secondary | ICD-10-CM | POA: Diagnosis not present

## 2022-10-29 DIAGNOSIS — N2581 Secondary hyperparathyroidism of renal origin: Secondary | ICD-10-CM | POA: Diagnosis not present

## 2022-10-29 DIAGNOSIS — N186 End stage renal disease: Secondary | ICD-10-CM | POA: Diagnosis not present

## 2022-10-29 DIAGNOSIS — Z992 Dependence on renal dialysis: Secondary | ICD-10-CM | POA: Diagnosis not present

## 2022-10-29 DIAGNOSIS — D509 Iron deficiency anemia, unspecified: Secondary | ICD-10-CM | POA: Diagnosis not present

## 2022-10-29 DIAGNOSIS — D631 Anemia in chronic kidney disease: Secondary | ICD-10-CM | POA: Diagnosis not present

## 2022-10-29 DIAGNOSIS — K31811 Angiodysplasia of stomach and duodenum with bleeding: Secondary | ICD-10-CM | POA: Diagnosis not present

## 2022-10-30 DIAGNOSIS — E1142 Type 2 diabetes mellitus with diabetic polyneuropathy: Secondary | ICD-10-CM | POA: Diagnosis not present

## 2022-10-30 DIAGNOSIS — N186 End stage renal disease: Secondary | ICD-10-CM | POA: Diagnosis not present

## 2022-10-30 DIAGNOSIS — E1122 Type 2 diabetes mellitus with diabetic chronic kidney disease: Secondary | ICD-10-CM | POA: Diagnosis not present

## 2022-10-30 DIAGNOSIS — I120 Hypertensive chronic kidney disease with stage 5 chronic kidney disease or end stage renal disease: Secondary | ICD-10-CM | POA: Diagnosis not present

## 2022-10-31 DIAGNOSIS — D631 Anemia in chronic kidney disease: Secondary | ICD-10-CM | POA: Diagnosis not present

## 2022-10-31 DIAGNOSIS — N186 End stage renal disease: Secondary | ICD-10-CM | POA: Diagnosis not present

## 2022-10-31 DIAGNOSIS — D509 Iron deficiency anemia, unspecified: Secondary | ICD-10-CM | POA: Diagnosis not present

## 2022-10-31 DIAGNOSIS — K31811 Angiodysplasia of stomach and duodenum with bleeding: Secondary | ICD-10-CM | POA: Diagnosis not present

## 2022-10-31 DIAGNOSIS — Z992 Dependence on renal dialysis: Secondary | ICD-10-CM | POA: Diagnosis not present

## 2022-10-31 DIAGNOSIS — N2581 Secondary hyperparathyroidism of renal origin: Secondary | ICD-10-CM | POA: Diagnosis not present

## 2022-11-02 DIAGNOSIS — N186 End stage renal disease: Secondary | ICD-10-CM | POA: Diagnosis not present

## 2022-11-02 DIAGNOSIS — N2581 Secondary hyperparathyroidism of renal origin: Secondary | ICD-10-CM | POA: Diagnosis not present

## 2022-11-02 DIAGNOSIS — D509 Iron deficiency anemia, unspecified: Secondary | ICD-10-CM | POA: Diagnosis not present

## 2022-11-02 DIAGNOSIS — K31811 Angiodysplasia of stomach and duodenum with bleeding: Secondary | ICD-10-CM | POA: Diagnosis not present

## 2022-11-02 DIAGNOSIS — Z992 Dependence on renal dialysis: Secondary | ICD-10-CM | POA: Diagnosis not present

## 2022-11-02 DIAGNOSIS — D631 Anemia in chronic kidney disease: Secondary | ICD-10-CM | POA: Diagnosis not present

## 2022-11-04 DIAGNOSIS — Z992 Dependence on renal dialysis: Secondary | ICD-10-CM | POA: Diagnosis not present

## 2022-11-04 DIAGNOSIS — D509 Iron deficiency anemia, unspecified: Secondary | ICD-10-CM | POA: Diagnosis not present

## 2022-11-04 DIAGNOSIS — K31811 Angiodysplasia of stomach and duodenum with bleeding: Secondary | ICD-10-CM | POA: Diagnosis not present

## 2022-11-04 DIAGNOSIS — N186 End stage renal disease: Secondary | ICD-10-CM | POA: Diagnosis not present

## 2022-11-04 DIAGNOSIS — D631 Anemia in chronic kidney disease: Secondary | ICD-10-CM | POA: Diagnosis not present

## 2022-11-04 DIAGNOSIS — N2581 Secondary hyperparathyroidism of renal origin: Secondary | ICD-10-CM | POA: Diagnosis not present

## 2022-11-07 DIAGNOSIS — D631 Anemia in chronic kidney disease: Secondary | ICD-10-CM | POA: Diagnosis not present

## 2022-11-07 DIAGNOSIS — N2581 Secondary hyperparathyroidism of renal origin: Secondary | ICD-10-CM | POA: Diagnosis not present

## 2022-11-07 DIAGNOSIS — Z992 Dependence on renal dialysis: Secondary | ICD-10-CM | POA: Diagnosis not present

## 2022-11-07 DIAGNOSIS — D509 Iron deficiency anemia, unspecified: Secondary | ICD-10-CM | POA: Diagnosis not present

## 2022-11-07 DIAGNOSIS — K31811 Angiodysplasia of stomach and duodenum with bleeding: Secondary | ICD-10-CM | POA: Diagnosis not present

## 2022-11-07 DIAGNOSIS — N186 End stage renal disease: Secondary | ICD-10-CM | POA: Diagnosis not present

## 2022-11-09 DIAGNOSIS — Z992 Dependence on renal dialysis: Secondary | ICD-10-CM | POA: Diagnosis not present

## 2022-11-09 DIAGNOSIS — D631 Anemia in chronic kidney disease: Secondary | ICD-10-CM | POA: Diagnosis not present

## 2022-11-09 DIAGNOSIS — K31811 Angiodysplasia of stomach and duodenum with bleeding: Secondary | ICD-10-CM | POA: Diagnosis not present

## 2022-11-09 DIAGNOSIS — D509 Iron deficiency anemia, unspecified: Secondary | ICD-10-CM | POA: Diagnosis not present

## 2022-11-09 DIAGNOSIS — N186 End stage renal disease: Secondary | ICD-10-CM | POA: Diagnosis not present

## 2022-11-09 DIAGNOSIS — N2581 Secondary hyperparathyroidism of renal origin: Secondary | ICD-10-CM | POA: Diagnosis not present

## 2022-11-11 DIAGNOSIS — D509 Iron deficiency anemia, unspecified: Secondary | ICD-10-CM | POA: Diagnosis not present

## 2022-11-11 DIAGNOSIS — N2581 Secondary hyperparathyroidism of renal origin: Secondary | ICD-10-CM | POA: Diagnosis not present

## 2022-11-11 DIAGNOSIS — D631 Anemia in chronic kidney disease: Secondary | ICD-10-CM | POA: Diagnosis not present

## 2022-11-11 DIAGNOSIS — Z992 Dependence on renal dialysis: Secondary | ICD-10-CM | POA: Diagnosis not present

## 2022-11-11 DIAGNOSIS — E1122 Type 2 diabetes mellitus with diabetic chronic kidney disease: Secondary | ICD-10-CM | POA: Diagnosis not present

## 2022-11-11 DIAGNOSIS — K31811 Angiodysplasia of stomach and duodenum with bleeding: Secondary | ICD-10-CM | POA: Diagnosis not present

## 2022-11-11 DIAGNOSIS — N186 End stage renal disease: Secondary | ICD-10-CM | POA: Diagnosis not present

## 2022-11-14 DIAGNOSIS — K31811 Angiodysplasia of stomach and duodenum with bleeding: Secondary | ICD-10-CM | POA: Diagnosis not present

## 2022-11-14 DIAGNOSIS — N186 End stage renal disease: Secondary | ICD-10-CM | POA: Diagnosis not present

## 2022-11-14 DIAGNOSIS — Z992 Dependence on renal dialysis: Secondary | ICD-10-CM | POA: Diagnosis not present

## 2022-11-14 DIAGNOSIS — D631 Anemia in chronic kidney disease: Secondary | ICD-10-CM | POA: Diagnosis not present

## 2022-11-14 DIAGNOSIS — N2581 Secondary hyperparathyroidism of renal origin: Secondary | ICD-10-CM | POA: Diagnosis not present

## 2022-11-14 DIAGNOSIS — D509 Iron deficiency anemia, unspecified: Secondary | ICD-10-CM | POA: Diagnosis not present

## 2022-11-15 ENCOUNTER — Encounter: Payer: Medicare Other | Attending: Registered Nurse | Admitting: Registered Nurse

## 2022-11-15 ENCOUNTER — Encounter: Payer: Self-pay | Admitting: Registered Nurse

## 2022-11-15 VITALS — BP 162/79 | HR 75

## 2022-11-15 DIAGNOSIS — Z5181 Encounter for therapeutic drug level monitoring: Secondary | ICD-10-CM | POA: Insufficient documentation

## 2022-11-15 DIAGNOSIS — Z79891 Long term (current) use of opiate analgesic: Secondary | ICD-10-CM | POA: Insufficient documentation

## 2022-11-15 DIAGNOSIS — G894 Chronic pain syndrome: Secondary | ICD-10-CM | POA: Insufficient documentation

## 2022-11-15 DIAGNOSIS — G546 Phantom limb syndrome with pain: Secondary | ICD-10-CM | POA: Insufficient documentation

## 2022-11-15 DIAGNOSIS — Z89611 Acquired absence of right leg above knee: Secondary | ICD-10-CM | POA: Insufficient documentation

## 2022-11-15 DIAGNOSIS — S88112A Complete traumatic amputation at level between knee and ankle, left lower leg, initial encounter: Secondary | ICD-10-CM | POA: Diagnosis not present

## 2022-11-15 DIAGNOSIS — M5416 Radiculopathy, lumbar region: Secondary | ICD-10-CM | POA: Diagnosis not present

## 2022-11-15 MED ORDER — OXYCODONE-ACETAMINOPHEN 5-325 MG PO TABS: 1.0000 | ORAL_TABLET | Freq: Two times a day (BID) | ORAL | 0 refills | Status: DC | PRN

## 2022-11-15 NOTE — Progress Notes (Signed)
Subjective:    Patient ID: Alexander [name], male    DOB: 12-20-1960, 62 y.o.   MRN: [id number]  HPI: Alexander [name] is a 62 y.o. male who returns for follow up appointment for chronic pain and medication refill. He states his  pain is located in his lower back radiating into her lower extremities. He also reports he has phantom pain. He rates his pain 4. His current exercise regime is  performing stretching exercises.  Alexander [name] Morphine equivalent is 15.00 MME.   Oral Swab was Performed today.    Pain Inventory Average Pain 6 Pain Right Now 4 My pain is sharp  In the last 24 hours, has pain interfered with the following? General activity 0 Relation with others 0 Enjoyment of life 7 What TIME of day is your pain at its worst? night Sleep (in general) Fair  Pain is worse with: unsure Pain improves with: medication Relief from Meds: 7  Family History  Problem Relation Age of Onset   Diabetes type II Mother    Hypertension Mother    Diabetes Mother    Liver cancer Father    Diabetes type II Sister    Breast cancer Sister    Diabetes Sister    Hypertension Sister    Diabetes type II Brother    Kidney failure Brother    Diabetes Brother    Hypertension Brother    Diabetes type II Sister    Social History   Socioeconomic History   Marital status: Married    Spouse name: Not on file   Number of children: 2   Years of education: Not on file   Highest education level: Not on file  Occupational History   Occupation: Disabled  Tobacco Use   Smoking status: Every Day    Types: Cigars    Passive exposure: Current   Smokeless tobacco: Never   Tobacco comments:    Smokes cigars daily, has not smoked cigarettes since 2014  Vaping Use   Vaping Use: Never used  Substance and Sexual Activity   Alcohol use: Not Currently    Alcohol/week: 21.0 standard drinks of alcohol    Types: 21 Glasses of wine per week    Comment: 1/2 to a whole beer once a week as of 05/10/22   Drug  use: No   Sexual activity: Not on file  Other Topics Concern   Not on file  Social History Narrative   Not on file   Social Determinants of Health   Financial Resource Strain: Not on file  Food Insecurity: Not on file  Transportation Needs: Not on file  Physical Activity: Not on file  Stress: Not on file  Social Connections: Not on file   Past Surgical History:  Procedure Laterality Date   A/V FISTULAGRAM Right 05/01/2021   Procedure: A/V FISTULAGRAM;  Surgeon: Waynetta Sandy, MD;  Location: [place] [facility];  Service: Cardiovascular;  Laterality: Right;  failed fistulagram   ABDOMINAL AORTOGRAM W/LOWER EXTREMITY N/A 09/09/2017   Procedure: ABDOMINAL AORTOGRAM W/LOWER EXTREMITY;  Surgeon: Angelia Mould, MD;  Location: [place] [facility];  Service: Cardiovascular;  Laterality: N/A;   ABDOMINAL AORTOGRAM W/LOWER EXTREMITY Right 11/11/2019   Procedure: ABDOMINAL AORTOGRAM W/LOWER EXTREMITY;  Surgeon: Marty Heck, MD;  Location: [place] [facility];  Service: Cardiovascular;  Laterality: Right;   ABDOMINAL AORTOGRAM W/LOWER EXTREMITY N/A 01/26/2022   Procedure: ABDOMINAL AORTOGRAM W/LOWER EXTREMITY;  Surgeon: Adrian Prows, MD;  Location: [place] [facility];  Service: Cardiovascular;  Laterality: N/A;   AMPUTATION Left 08/19/2013   Procedure: AMPUTATION RAY;  Surgeon: Alta Corning, MD;  Location: [place];  Service: Orthopedics;  Laterality: Left;  ray amputation left 5th   AMPUTATION Left 10/27/2013   Procedure: AMPUTATION DIGIT-LEFT 4TH TOE, 4th and 5th metatarsal.;  Surgeon: Angelia Mould, MD;  Location: [place];  Service: Vascular;  Laterality: Left;   AMPUTATION Left 11/04/2013   Procedure: LEFT FOOT TRANS-METATARSAL AMPUTATION WITH WOUND CLOSURE ;  Surgeon: Alta Corning, MD;  Location: [place];  Service: Orthopedics;  Laterality: Left;   AMPUTATION Right 09/10/2017   Procedure: RIGHT FOURTH AND FIFTH TOE AMPUTATION;  Surgeon: Angelia Mould, MD;   Location: [place];  Service: Vascular;  Laterality: Right;   AMPUTATION Right 02/19/2020   Procedure: AMPUTATION RIGHT 3RD TOE;  Surgeon: Angelia Mould, MD;  Location: [place];  Service: Vascular;  Laterality: Right;   AMPUTATION Right 03/11/2020   Procedure: RIGHT BELOW KNEE AMPUTATION;  Surgeon: Newt Minion, MD;  Location: [place];  Service: Orthopedics;  Laterality: Right;   AMPUTATION Right 01/31/2022   Procedure: RIGHT ABOVE KNEE AMPUTATION;  Surgeon: Newt Minion, MD;  Location: [place];  Service: Orthopedics;  Laterality: Right;   AMPUTATION Left 05/11/2022   Procedure: LEFT BELOW KNEE AMPUTATION;  Surgeon: Newt Minion, MD;  Location: [place];  Service: Orthopedics;  Laterality: Left;   APPLICATION OF WOUND VAC Right 02/19/2020   Procedure: Application Of Wound Vac Right foot;  Surgeon: Angelia Mould, MD;  Location: [place];  Service: Vascular;  Laterality: Right;   APPLICATION OF WOUND VAC Left 05/11/2022   Procedure: APPLICATION OF WOUND VAC;  Surgeon: Newt Minion, MD;  Location: [place];  Service: Orthopedics;  Laterality: Left;   AV FISTULA PLACEMENT Right 03/27/2021   Procedure: RIGHT ARM ARTERIOVENOUS (AV) FISTULA;  Surgeon: Angelia Mould, MD;  Location: [place];  Service: Vascular;  Laterality: Right;   AV FISTULA PLACEMENT Right 05/10/2021   Procedure: RIGHT ARM ARTERIOVENOUS (AV) FISTULA;  Surgeon: Waynetta Sandy, MD;  Location: [place];  Service: Vascular;  Laterality: Right;   [place] Right 08/11/2021   Procedure: RIGHT ARM SECOND STAGE BASILIC VEIN TRANSPOSITION;  Surgeon: Waynetta Sandy, MD;  Location: [place];  Service: Vascular;  Laterality: Right;   BIOPSY  07/20/2020   Procedure: BIOPSY;  Surgeon: [place] Copas., MD;  Location: [place];  Service: Gastroenterology;;   CARDIAC CATHETERIZATION  10/07/2014   Procedure: LEFT HEART CATH AND CORONARY ANGIOGRAPHY;  Surgeon: Laverda Page, MD;  Location: [facility] C[facility] LAB;  Service: Cardiovascular;;   COLONOSCOPY W/ POLYPECTOMY     COLONOSCOPY WITH PROPOFOL N/A 05/25/2020   Procedure: COLONOSCOPY WITH PROPOFOL;  Surgeon: Gatha Mayer, MD;  [facility];  Service: Endoscopy;  Laterality: N/A;   EMBOLECTOMY Right 09/12/2017   Procedure: Thrombectomy  & Redo Right Below Knee Popliteal Artery Bypass Graft  ;  Surgeon: Waynetta Sandy, MD;  Location: [facility];  Service: Vascular;  Laterality: Right;   ENTEROSCOPY N/A 07/20/2020   Procedure: ENTEROSCOPY;  Surgeon: Rush Landmark Telford Nab., MD;  Location: [place];  Service: Gastroenterology;  Laterality: N/A;   ESOPHAGOGASTRODUODENOSCOPY (EGD) WITH PROPOFOL N/A 05/24/2020   Procedure: ESOPHAGOGASTRODUODENOSCOPY (EGD) WITH PROPOFOL;  Surgeon: Ladene Artist, MD;  [facility];  Service: Endoscopy;  Laterality: N/A;   EYE SURGERY Bilateral    cataract  FEMORAL-TIBIAL BYPASS GRAFT Left 10/27/2013   Procedure: BYPASS GRAFT LEFT FEMORAL- POSTERIOR TIBIAL ARTERY;  Surgeon: Angelia Mould, MD;  Location: [place];  Service: Vascular;  Laterality: Left;   FEMORAL-TIBIAL BYPASS GRAFT Right 09/10/2017   Procedure: RIGHT SUPERFICIAL  FEMORAL ARTERY-BELOW KNEE POPLITEAL ARTERY BYPASS GRAFT WITH VEIN;  Surgeon: Angelia Mould, MD;  Location: [place];  Service: Vascular;  Laterality: Right;   GIVENS CAPSULE STUDY N/A 07/20/2020   Procedure: GIVENS CAPSULE STUDY;  Surgeon: [place] Copas., MD;  Location: [place];  Service: Gastroenterology;  Laterality: N/A;  capsule placed through scope into duodenum @ 0936   I & D EXTREMITY Left 10/27/2013   Procedure: IRRIGATION AND DEBRIDEMENT EXTREMITY- LEFT FOOT;  Surgeon: Angelia Mould, MD;  Location: [place];  Service: Vascular;  Laterality: Left;   I & D EXTREMITY Right 01/26/2020   Procedure: IRRIGATION AND DEBRIDEMENT EXTREMITY RIGHT FOOT WOUND;  Surgeon: Angelia Mould, MD;   Location: [place];  Service: Vascular;  Laterality: Right;   INSERT / REPLACE / REMOVE PACEMAKER     INSERTION OF DIALYSIS CATHETER Right 11/03/2021   Procedure: RIGHT INTERNAL JUGULAR INSERTION OF TUNNELED DIALYSIS CATHETER;  Surgeon: Angelia Mould, MD;  Location: [place];  Service: Vascular;  Laterality: Right;   INTRAOPERATIVE ARTERIOGRAM Right 09/10/2017   Procedure: INTRA OPERATIVE ARTERIOGRAM;  Surgeon: Angelia Mould, MD;  Location: [place];  Service: Vascular;  Laterality: Right;   IR ANGIOGRAM FOLLOW UP STUDY  09/09/2017   IR FLUORO GUIDE CV LINE RIGHT  05/31/2020   IR LUMBAR DISC ASPIRATION W/IMG GUIDE  01/19/2022   IR US GUIDE VASC ACCESS RIGHT  06/01/2020   LEFT HEART CATH AND CORONARY ANGIOGRAPHY N/A 08/19/2020   Procedure: LEFT HEART CATH AND CORONARY ANGIOGRAPHY;  Surgeon: Adrian Prows, MD;  Location: [place] [facility];  Service: Cardiovascular;  Laterality: N/A;   LEFT HEART CATHETERIZATION WITH CORONARY ANGIOGRAM N/A 11/09/2014   Procedure: LEFT HEART CATHETERIZATION WITH CORONARY ANGIOGRAM;  Surgeon: Laverda Page, MD;  Location: [facility] CATH LAB;  Service: Cardiovascular;  Laterality: N/A;   LOWER EXTREMITY ANGIOGRAM Right 11/08/2015   Procedure: Lower Extremity Angiogram;  Surgeon: Serafina Mitchell, MD;  Location: [place] [facility];  Service: Cardiovascular;  Laterality: Right;   LUMBAR LAMINECTOMY  06/13/2012   Procedure: MICRODISCECTOMY LUMBAR LAMINECTOMY;  Surgeon: Jessy Oto, MD;  Location: [place];  Service: Orthopedics;  Laterality: N/A;  Central laminectomy L2-3, L3-4, L4-5   PACEMAKER INSERTION  10/08/2014   MDT Advisa MRI compatible dual chamber pacemaker implanted by Dr Caryl Comes for syncope with post-termination pauses   PERCUTANEOUS CORONARY STENT INTERVENTION (PCI-S)  11/09/2014   des to lad & distal circumflex         Dr  [name]   PERIPHERAL VASCULAR BALLOON ANGIOPLASTY Right 11/11/2019   Procedure: PERIPHERAL VASCULAR BALLOON ANGIOPLASTY;  Surgeon: Marty Heck, MD;  Location: [place] [facility];  Service: Cardiovascular;  Laterality: Right;  below knee popliteal, tibioperoneal trunk, posterior tibial arteries   PERIPHERAL VASCULAR CATHETERIZATION N/A 11/08/2015   Procedure: Abdominal Aortogram;  Surgeon: Serafina Mitchell, MD;  Location: [place] [facility];  Service: Cardiovascular;  Laterality: N/A;   PERIPHERAL VASCULAR CATHETERIZATION Right 11/08/2015   Procedure: Peripheral Vascular Atherectomy;  Surgeon: Serafina Mitchell, MD;  Location: [place] [facility];  Service: Cardiovascular;  Laterality: Right;   PERIPHERAL VASCULAR CATHETERIZATION N/A 10/10/2016   Procedure: Lower Extremity Angiography;  Surgeon: Waynetta Sandy, MD;  Location: [place] [facility];  Service: Cardiovascular;  Laterality: N/A;   PERIPHERAL VASCULAR CATHETERIZATION Right 10/10/2016   Procedure: Peripheral Vascular Atherectomy;  Surgeon: Waynetta Sandy, MD;  Location: [place] [facility];  Service: Cardiovascular;  Laterality: Right;  Popliteal   PERMANENT PACEMAKER INSERTION N/A 10/08/2014   Procedure: PERMANENT PACEMAKER INSERTION;  Surgeon: Deboraha Sprang, MD;  Location: [facility] CATH LAB;  Service: Cardiovascular;  Laterality: N/A;   RADIOLOGY WITH ANESTHESIA N/A 01/19/2022   Procedure: Disc Aspiration;  Surgeon: Pedro Earls, MD;  Location: [place];  Service: Radiology;  Laterality: N/A;   SUBMUCOSAL TATTOO INJECTION  07/20/2020   Procedure: SUBMUCOSAL TATTOO INJECTION;  Surgeon: [place] Copas., MD;  Location: [place];  Service: Gastroenterology;;   TEE WITHOUT CARDIOVERSION N/A 10/27/2021   Procedure: TRANSESOPHAGEAL ECHOCARDIOGRAM (TEE);  Surgeon: Adrian Prows, MD;  Location: [facility] ENDOSCOPY;  Service: Cardiovascular;  Laterality: N/A;   TEMPORARY PACEMAKER INSERTION Bilateral 10/07/2014   Procedure: TEMPORARY PACEMAKER INSERTION;  Surgeon: Laverda Page, MD;  Location: [facility] C[facility] LAB;  Service: Cardiovascular;  Laterality:  Bilateral;   WOUND DEBRIDEMENT Right 02/19/2020   Procedure: DEBRIDEMENT WOUND RIGHT FOOT;  Surgeon: Angelia Mould, MD;  Location: [facility] OR;  Service: Vascular;  Laterality: Right;   Past Surgical History:  Procedure Laterality Date   A/V FISTULAGRAM Right 05/01/2021   Procedure: A/V FISTULAGRAM;  Surgeon: Waynetta Sandy, MD;  Location: [place] [facility];  Service: Cardiovascular;  Laterality: Right;  failed fistulagram   ABDOMINAL AORTOGRAM W/LOWER EXTREMITY N/A 09/09/2017   Procedure: ABDOMINAL AORTOGRAM W/LOWER EXTREMITY;  Surgeon: Angelia Mould, MD;  Location: [place] [facility];  Service: Cardiovascular;  Laterality: N/A;   ABDOMINAL AORTOGRAM W/LOWER EXTREMITY Right 11/11/2019   Procedure: ABDOMINAL AORTOGRAM W/LOWER EXTREMITY;  Surgeon: Marty Heck, MD;  Location: [place] [facility];  Service: Cardiovascular;  Laterality: Right;   ABDOMINAL AORTOGRAM W/LOWER EXTREMITY N/A 01/26/2022   Procedure: ABDOMINAL AORTOGRAM W/LOWER EXTREMITY;  Surgeon: Adrian Prows, MD;  Location: [place] [facility];  Service: Cardiovascular;  Laterality: N/A;   AMPUTATION Left 08/19/2013   Procedure: AMPUTATION RAY;  Surgeon: Alta Corning, MD;  Location: [place];  Service: Orthopedics;  Laterality: Left;  ray amputation left 5th   AMPUTATION Left 10/27/2013   Procedure: AMPUTATION DIGIT-LEFT 4TH TOE, 4th and 5th metatarsal.;  Surgeon: Angelia Mould, MD;  Location: [place];  Service: Vascular;  Laterality: Left;   AMPUTATION Left 11/04/2013   Procedure: LEFT FOOT TRANS-METATARSAL AMPUTATION WITH WOUND CLOSURE ;  Surgeon: Alta Corning, MD;  Location: [place];  Service: Orthopedics;  Laterality: Left;   AMPUTATION Right 09/10/2017   Procedure: RIGHT FOURTH AND FIFTH TOE AMPUTATION;  Surgeon: Angelia Mould, MD;  Location: [place];  Service: Vascular;  Laterality: Right;   AMPUTATION Right 02/19/2020   Procedure: AMPUTATION RIGHT 3RD TOE;  Surgeon: Angelia Mould,  MD;  Location: [place];  Service: Vascular;  Laterality: Right;   AMPUTATION Right 03/11/2020   Procedure: RIGHT BELOW KNEE AMPUTATION;  Surgeon: Newt Minion, MD;  Location: [place];  Service: Orthopedics;  Laterality: Right;   AMPUTATION Right 01/31/2022   Procedure: RIGHT ABOVE KNEE AMPUTATION;  Surgeon: Newt Minion, MD;  Location: [place];  Service: Orthopedics;  Laterality: Right;   AMPUTATION Left 05/11/2022   Procedure: LEFT BELOW KNEE AMPUTATION;  Surgeon: Newt Minion, MD;  Location: [place];  Service: Orthopedics;  Laterality: Left;   APPLICATION OF WOUND VAC  Right 02/19/2020   Procedure: Application Of Wound Vac Right foot;  Surgeon: Angelia Mould, MD;  Location: [place];  Service: Vascular;  Laterality: Right;   APPLICATION OF WOUND VAC Left 05/11/2022   Procedure: APPLICATION OF WOUND VAC;  Surgeon: Newt Minion, MD;  Location: [place];  Service: Orthopedics;  Laterality: Left;   AV FISTULA PLACEMENT Right 03/27/2021   Procedure: RIGHT ARM ARTERIOVENOUS (AV) FISTULA;  Surgeon: Angelia Mould, MD;  Location: [place];  Service: Vascular;  Laterality: Right;   AV FISTULA PLACEMENT Right 05/10/2021   Procedure: RIGHT ARM ARTERIOVENOUS (AV) FISTULA;  Surgeon: Waynetta Sandy, MD;  Location: [place];  Service: Vascular;  Laterality: Right;   [place] Right 08/11/2021   Procedure: RIGHT ARM SECOND STAGE BASILIC VEIN TRANSPOSITION;  Surgeon: Waynetta Sandy, MD;  Location: [place];  Service: Vascular;  Laterality: Right;   BIOPSY  07/20/2020   Procedure: BIOPSY;  Surgeon: [place] Copas., MD;  Location: [place];  Service: Gastroenterology;;   CARDIAC CATHETERIZATION  10/07/2014   Procedure: LEFT HEART CATH AND CORONARY ANGIOGRAPHY;  Surgeon: Laverda Page, MD;  Location: [facility] CATH LAB;  Service: Cardiovascular;;   COLONOSCOPY W/ POLYPECTOMY     COLONOSCOPY WITH PROPOFOL N/A 05/25/2020   Procedure: COLONOSCOPY WITH  PROPOFOL;  Surgeon: Gatha Mayer, MD;  [facility];  Service: Endoscopy;  Laterality: N/A;   EMBOLECTOMY Right 09/12/2017   Procedure: Thrombectomy  & Redo Right Below Knee Popliteal Artery Bypass Graft  ;  Surgeon: Waynetta Sandy, MD;  Location: [facility] OR;  Service: Vascular;  Laterality: Right;   ENTEROSCOPY N/A 07/20/2020   Procedure: ENTEROSCOPY;  Surgeon: Rush Landmark Telford Nab., MD;  Location: [place];  Service: Gastroenterology;  Laterality: N/A;   ESOPHAGOGASTRODUODENOSCOPY (EGD) WITH PROPOFOL N/A 05/24/2020   Procedure: ESOPHAGOGASTRODUODENOSCOPY (EGD) WITH PROPOFOL;  Surgeon: Ladene Artist, MD;  [facility];  Service: Endoscopy;  Laterality: N/A;   EYE SURGERY Bilateral    cataract   FEMORAL-TIBIAL BYPASS GRAFT Left 10/27/2013   Procedure: BYPASS GRAFT LEFT FEMORAL- POSTERIOR TIBIAL ARTERY;  Surgeon: Angelia Mould, MD;  Location: [place];  Service: Vascular;  Laterality: Left;   FEMORAL-TIBIAL BYPASS GRAFT Right 09/10/2017   Procedure: RIGHT SUPERFICIAL  FEMORAL ARTERY-BELOW KNEE POPLITEAL ARTERY BYPASS GRAFT WITH VEIN;  Surgeon: Angelia Mould, MD;  Location: [place];  Service: Vascular;  Laterality: Right;   GIVENS CAPSULE STUDY N/A 07/20/2020   Procedure: GIVENS CAPSULE STUDY;  Surgeon: [place] Copas., MD;  Location: [place];  Service: Gastroenterology;  Laterality: N/A;  capsule placed through scope into duodenum @ 0936   I & D EXTREMITY Left 10/27/2013   Procedure: IRRIGATION AND DEBRIDEMENT EXTREMITY- LEFT FOOT;  Surgeon: Angelia Mould, MD;  Location: [place];  Service: Vascular;  Laterality: Left;   I & D EXTREMITY Right 01/26/2020   Procedure: IRRIGATION AND DEBRIDEMENT EXTREMITY RIGHT FOOT WOUND;  Surgeon: Angelia Mould, MD;  Location: [place];  Service: Vascular;  Laterality: Right;   INSERT / REPLACE / REMOVE PACEMAKER     INSERTION OF DIALYSIS CATHETER Right 11/03/2021   Procedure: RIGHT INTERNAL  JUGULAR INSERTION OF TUNNELED DIALYSIS CATHETER;  Surgeon: Angelia Mould, MD;  Location: [place];  Service: Vascular;  Laterality: Right;   INTRAOPERATIVE ARTERIOGRAM Right 09/10/2017   Procedure: INTRA OPERATIVE ARTERIOGRAM;  Surgeon: Angelia Mould, MD;  Location: [place];  Service: Vascular;  Laterality: Right;  IR ANGIOGRAM FOLLOW UP STUDY  09/09/2017   IR FLUORO GUIDE CV LINE RIGHT  05/31/2020   IR LUMBAR DISC ASPIRATION W/IMG GUIDE  01/19/2022   IR US GUIDE VASC ACCESS RIGHT  06/01/2020   LEFT HEART CATH AND CORONARY ANGIOGRAPHY N/A 08/19/2020   Procedure: LEFT HEART CATH AND CORONARY ANGIOGRAPHY;  Surgeon: Adrian Prows, MD;  Location: [place] [facility];  Service: Cardiovascular;  Laterality: N/A;   LEFT HEART CATHETERIZATION WITH CORONARY ANGIOGRAM N/A 11/09/2014   Procedure: LEFT HEART CATHETERIZATION WITH CORONARY ANGIOGRAM;  Surgeon: Laverda Page, MD;  Location: [facility] CATH LAB;  Service: Cardiovascular;  Laterality: N/A;   LOWER EXTREMITY ANGIOGRAM Right 11/08/2015   Procedure: Lower Extremity Angiogram;  Surgeon: Serafina Mitchell, MD;  Location: [place] [facility];  Service: Cardiovascular;  Laterality: Right;   LUMBAR LAMINECTOMY  06/13/2012   Procedure: MICRODISCECTOMY LUMBAR LAMINECTOMY;  Surgeon: Jessy Oto, MD;  Location: [place];  Service: Orthopedics;  Laterality: N/A;  Central laminectomy L2-3, L3-4, L4-5   PACEMAKER INSERTION  10/08/2014   MDT Advisa MRI compatible dual chamber pacemaker implanted by Dr Caryl Comes for syncope with post-termination pauses   PERCUTANEOUS CORONARY STENT INTERVENTION (PCI-S)  11/09/2014   des to lad & distal circumflex         Dr  [name]   PERIPHERAL VASCULAR BALLOON ANGIOPLASTY Right 11/11/2019   Procedure: PERIPHERAL VASCULAR BALLOON ANGIOPLASTY;  Surgeon: Marty Heck, MD;  Location: [place] [facility];  Service: Cardiovascular;  Laterality: Right;  below knee popliteal, tibioperoneal trunk, posterior tibial arteries    PERIPHERAL VASCULAR CATHETERIZATION N/A 11/08/2015   Procedure: Abdominal Aortogram;  Surgeon: Serafina Mitchell, MD;  Location: [place] [facility];  Service: Cardiovascular;  Laterality: N/A;   PERIPHERAL VASCULAR CATHETERIZATION Right 11/08/2015   Procedure: Peripheral Vascular Atherectomy;  Surgeon: Serafina Mitchell, MD;  Location: [place] [facility];  Service: Cardiovascular;  Laterality: Right;   PERIPHERAL VASCULAR CATHETERIZATION N/A 10/10/2016   Procedure: Lower Extremity Angiography;  Surgeon: Waynetta Sandy, MD;  Location: [place] [facility];  Service: Cardiovascular;  Laterality: N/A;   PERIPHERAL VASCULAR CATHETERIZATION Right 10/10/2016   Procedure: Peripheral Vascular Atherectomy;  Surgeon: Waynetta Sandy, MD;  Location: [place] [facility];  Service: Cardiovascular;  Laterality: Right;  Popliteal   PERMANENT PACEMAKER INSERTION N/A 10/08/2014   Procedure: PERMANENT PACEMAKER INSERTION;  Surgeon: Deboraha Sprang, MD;  Location: [facility] CATH LAB;  Service: Cardiovascular;  Laterality: N/A;   RADIOLOGY WITH ANESTHESIA N/A 01/19/2022   Procedure: Disc Aspiration;  Surgeon: Pedro Earls, MD;  Location: [place];  Service: Radiology;  Laterality: N/A;   SUBMUCOSAL TATTOO INJECTION  07/20/2020   Procedure: SUBMUCOSAL TATTOO INJECTION;  Surgeon: [place] Copas., MD;  Location: [place];  Service: Gastroenterology;;   TEE WITHOUT CARDIOVERSION N/A 10/27/2021   Procedure: TRANSESOPHAGEAL ECHOCARDIOGRAM (TEE);  Surgeon: Adrian Prows, MD;  Location: [facility] ENDOSCOPY;  Service: Cardiovascular;  Laterality: N/A;   TEMPORARY PACEMAKER INSERTION Bilateral 10/07/2014   Procedure: TEMPORARY PACEMAKER INSERTION;  Surgeon: Laverda Page, MD;  Location: [facility] CATH LAB;  Service: Cardiovascular;  Laterality: Bilateral;   WOUND DEBRIDEMENT Right 02/19/2020   Procedure: DEBRIDEMENT WOUND RIGHT FOOT;  Surgeon: Angelia Mould, MD;  Location: [facility] OR;  Service: Vascular;   Laterality: Right;   Past Medical History:  Diagnosis Date   Acute blood loss anemia    Alcohol abuse    Anemia    Anemia of chronic disease  Arthritis    "patient does not think so."   Atrial fibrillation ([place])    Cardiac syncope 10/07/2014   rapid A fib with 8 sec pauses on converison with syncope- temp pacing wire placed then PPM   Cataract    BILATERAL   Coronary artery disease    with stents   Diabetes mellitus    dx---been  awhile   Elevated LFTs    Endocarditis of tricuspid valve    ESRD (end stage renal disease) ([place])    M/W/F   Gangrene of right foot (HCC)    GERD (gastroesophageal reflux disease)    History of blood transfusion    Hyperlipemia    Hypertension    Malnutrition ([place])    Osteomyelitis ([place])    right foot   Osteomyelitis of left foot ([place]) 08/19/2013   Peripheral vascular disease ([place])    Pneumonia    Presence of permanent cardiac pacemaker 10/08/2014   [employer]   There were no vitals taken for this visit.  Opioid Risk Score:   Fall Risk Score:  `1  Depression screen [facility] 2/9     08/07/2022   10:28 AM 05/10/2022    9:54 AM 01/18/2014    2:54 PM 11/20/2013    2:02 PM  Depression screen PHQ 2/9  Decreased Interest 0 0 0 0  Down, Depressed, Hopeless 0 0 0 0  PHQ - 2 Score 0 0 0 0  Altered sleeping  0    Tired, decreased energy  2    Change in appetite  3    Feeling bad or failure about yourself   0    Trouble concentrating  0    Moving slowly or fidgety/restless  0    Suicidal thoughts  0    PHQ-9 Score  5        Review of Systems     Objective:   Physical Exam Vitals and nursing note reviewed.  Constitutional:      Appearance: Normal appearance.  Cardiovascular:     Rate and Rhythm: Normal rate and regular rhythm.     Pulses: Normal pulses.     Heart sounds: Normal heart sounds.  Pulmonary:     Effort: Pulmonary effort is normal.     Breath sounds: Normal breath sounds.  Musculoskeletal:     Cervical back: Normal range of  motion and neck supple.     Comments: Normal Muscle Bulk and Muscle Testing Reveals:  Upper Extremities: Full ROM and Muscle Strength  5/5  Lumbar Paraspinal Tenderness: L-4-L-5 Lower Extremities: Right: AKA Left Lower Extremity: Left BKA Arrived in wheelchair     Skin:    General: Skin is warm and dry.  Neurological:     Mental Status: He is alert and oriented to person, place, and time.  Psychiatric:        Mood and Affect: Mood normal.        Behavior: Behavior normal.         Assessment & Plan:  Lumbar Radiculitis: Continue HEP as Tolerated. Continue Gabapentin as prescribed. Continue to Monitor.   Left BKA  with good healing : Ortho following. Continue to Monitor.  Phantom pain: Continue gabapentin . Continue to monitor.  Stump pain: Refilled: oxycodone '5mg'$ /325  one tablet every 12 hours as needed for pain #30. We will continue the opioid monitoring program, this consists of regular clinic visits, examinations, urine drug screen, pill counts as well as use of [place] Controlled Substance Reporting system. A  12 month History has been reviewed on the [place] Controlled Substance Reporting System on 11/15/2022 ESRD On Hemodialysis : Right AVF with + Bruit and Thrill. Nephrology Following.    F/U in 2 months

## 2022-11-16 DIAGNOSIS — D509 Iron deficiency anemia, unspecified: Secondary | ICD-10-CM | POA: Diagnosis not present

## 2022-11-16 DIAGNOSIS — Z992 Dependence on renal dialysis: Secondary | ICD-10-CM | POA: Diagnosis not present

## 2022-11-16 DIAGNOSIS — K31811 Angiodysplasia of stomach and duodenum with bleeding: Secondary | ICD-10-CM | POA: Diagnosis not present

## 2022-11-16 DIAGNOSIS — N186 End stage renal disease: Secondary | ICD-10-CM | POA: Diagnosis not present

## 2022-11-16 DIAGNOSIS — N2581 Secondary hyperparathyroidism of renal origin: Secondary | ICD-10-CM | POA: Diagnosis not present

## 2022-11-16 DIAGNOSIS — D631 Anemia in chronic kidney disease: Secondary | ICD-10-CM | POA: Diagnosis not present

## 2022-11-19 DIAGNOSIS — D509 Iron deficiency anemia, unspecified: Secondary | ICD-10-CM | POA: Diagnosis not present

## 2022-11-19 DIAGNOSIS — K31811 Angiodysplasia of stomach and duodenum with bleeding: Secondary | ICD-10-CM | POA: Diagnosis not present

## 2022-11-19 DIAGNOSIS — N2581 Secondary hyperparathyroidism of renal origin: Secondary | ICD-10-CM | POA: Diagnosis not present

## 2022-11-19 DIAGNOSIS — D631 Anemia in chronic kidney disease: Secondary | ICD-10-CM | POA: Diagnosis not present

## 2022-11-19 DIAGNOSIS — N186 End stage renal disease: Secondary | ICD-10-CM | POA: Diagnosis not present

## 2022-11-19 DIAGNOSIS — Z992 Dependence on renal dialysis: Secondary | ICD-10-CM | POA: Diagnosis not present

## 2022-11-20 LAB — DRUG TOX MONITOR 1 W/CONF, ORAL FLD
Amphetamines: NEGATIVE ng/mL (ref <10)
Barbiturates: NEGATIVE ng/mL (ref <10)
Benzodiazepines: NEGATIVE ng/mL (ref <0.50)
Buprenorphine: NEGATIVE ng/mL (ref <0.10)
Cocaine: NEGATIVE ng/mL (ref <5.0)
Codeine: NEGATIVE ng/mL (ref <2.5)
Cotinine: >250 ng/mL — ABNORMAL HIGH (ref <5.0)
Dihydrocodeine: NEGATIVE ng/mL (ref <2.5)
Fentanyl: NEGATIVE ng/mL (ref <0.10)
Heroin Metabolite: NEGATIVE ng/mL (ref <1.0)
Hydrocodone: NEGATIVE ng/mL (ref <2.5)
Hydromorphone: NEGATIVE ng/mL (ref <2.5)
MARIJUANA: NEGATIVE ng/mL (ref <2.5)
MDMA: NEGATIVE ng/mL (ref <10)
Meprobamate: NEGATIVE ng/mL (ref <2.5)
Methadone: NEGATIVE ng/mL (ref <5.0)
Morphine: NEGATIVE ng/mL (ref <2.5)
Nicotine Metabolite: POSITIVE ng/mL — AB (ref <5.0)
Norhydrocodone: NEGATIVE ng/mL (ref <2.5)
Noroxycodone: 9.2 ng/mL — ABNORMAL HIGH (ref <2.5)
Opiates: POSITIVE ng/mL — AB (ref <2.5)
Oxycodone: 31.7 ng/mL — ABNORMAL HIGH (ref <2.5)
Oxymorphone: NEGATIVE ng/mL (ref <2.5)
Phencyclidine: NEGATIVE ng/mL (ref <10)
Tapentadol: NEGATIVE ng/mL (ref <5.0)
Tramadol: NEGATIVE ng/mL (ref <5.0)
Zolpidem: NEGATIVE ng/mL (ref <5.0)

## 2022-11-20 LAB — DRUG TOX ALC METAB W/CON, ORAL FLD: Alcohol Metabolite: NEGATIVE ng/mL (ref <25)

## 2022-11-21 DIAGNOSIS — N186 End stage renal disease: Secondary | ICD-10-CM | POA: Diagnosis not present

## 2022-11-21 DIAGNOSIS — N2581 Secondary hyperparathyroidism of renal origin: Secondary | ICD-10-CM | POA: Diagnosis not present

## 2022-11-21 DIAGNOSIS — Z992 Dependence on renal dialysis: Secondary | ICD-10-CM | POA: Diagnosis not present

## 2022-11-21 DIAGNOSIS — K31811 Angiodysplasia of stomach and duodenum with bleeding: Secondary | ICD-10-CM | POA: Diagnosis not present

## 2022-11-21 DIAGNOSIS — D631 Anemia in chronic kidney disease: Secondary | ICD-10-CM | POA: Diagnosis not present

## 2022-11-21 DIAGNOSIS — D509 Iron deficiency anemia, unspecified: Secondary | ICD-10-CM | POA: Diagnosis not present

## 2022-11-22 ENCOUNTER — Encounter (HOSPITAL_COMMUNITY): Payer: Self-pay | Admitting: *Deleted

## 2022-11-23 DIAGNOSIS — K31811 Angiodysplasia of stomach and duodenum with bleeding: Secondary | ICD-10-CM | POA: Diagnosis not present

## 2022-11-23 DIAGNOSIS — D631 Anemia in chronic kidney disease: Secondary | ICD-10-CM | POA: Diagnosis not present

## 2022-11-23 DIAGNOSIS — N2581 Secondary hyperparathyroidism of renal origin: Secondary | ICD-10-CM | POA: Diagnosis not present

## 2022-11-23 DIAGNOSIS — N186 End stage renal disease: Secondary | ICD-10-CM | POA: Diagnosis not present

## 2022-11-23 DIAGNOSIS — D509 Iron deficiency anemia, unspecified: Secondary | ICD-10-CM | POA: Diagnosis not present

## 2022-11-23 DIAGNOSIS — Z992 Dependence on renal dialysis: Secondary | ICD-10-CM | POA: Diagnosis not present

## 2022-11-26 DIAGNOSIS — N186 End stage renal disease: Secondary | ICD-10-CM | POA: Diagnosis not present

## 2022-11-26 DIAGNOSIS — D631 Anemia in chronic kidney disease: Secondary | ICD-10-CM | POA: Diagnosis not present

## 2022-11-26 DIAGNOSIS — D509 Iron deficiency anemia, unspecified: Secondary | ICD-10-CM | POA: Diagnosis not present

## 2022-11-26 DIAGNOSIS — K31811 Angiodysplasia of stomach and duodenum with bleeding: Secondary | ICD-10-CM | POA: Diagnosis not present

## 2022-11-26 DIAGNOSIS — Z992 Dependence on renal dialysis: Secondary | ICD-10-CM | POA: Diagnosis not present

## 2022-11-26 DIAGNOSIS — N2581 Secondary hyperparathyroidism of renal origin: Secondary | ICD-10-CM | POA: Diagnosis not present

## 2022-11-28 DIAGNOSIS — K31811 Angiodysplasia of stomach and duodenum with bleeding: Secondary | ICD-10-CM | POA: Diagnosis not present

## 2022-11-28 DIAGNOSIS — D509 Iron deficiency anemia, unspecified: Secondary | ICD-10-CM | POA: Diagnosis not present

## 2022-11-28 DIAGNOSIS — N186 End stage renal disease: Secondary | ICD-10-CM | POA: Diagnosis not present

## 2022-11-28 DIAGNOSIS — Z992 Dependence on renal dialysis: Secondary | ICD-10-CM | POA: Diagnosis not present

## 2022-11-28 DIAGNOSIS — N2581 Secondary hyperparathyroidism of renal origin: Secondary | ICD-10-CM | POA: Diagnosis not present

## 2022-11-28 DIAGNOSIS — D631 Anemia in chronic kidney disease: Secondary | ICD-10-CM | POA: Diagnosis not present

## 2022-11-30 DIAGNOSIS — N2581 Secondary hyperparathyroidism of renal origin: Secondary | ICD-10-CM | POA: Diagnosis not present

## 2022-11-30 DIAGNOSIS — N186 End stage renal disease: Secondary | ICD-10-CM | POA: Diagnosis not present

## 2022-11-30 DIAGNOSIS — K31811 Angiodysplasia of stomach and duodenum with bleeding: Secondary | ICD-10-CM | POA: Diagnosis not present

## 2022-11-30 DIAGNOSIS — D631 Anemia in chronic kidney disease: Secondary | ICD-10-CM | POA: Diagnosis not present

## 2022-11-30 DIAGNOSIS — D509 Iron deficiency anemia, unspecified: Secondary | ICD-10-CM | POA: Diagnosis not present

## 2022-11-30 DIAGNOSIS — Z992 Dependence on renal dialysis: Secondary | ICD-10-CM | POA: Diagnosis not present

## 2022-12-02 ENCOUNTER — Other Ambulatory Visit: Payer: Self-pay | Admitting: Cardiology

## 2022-12-02 DIAGNOSIS — I10 Essential (primary) hypertension: Secondary | ICD-10-CM

## 2022-12-02 DIAGNOSIS — I480 Paroxysmal atrial fibrillation: Secondary | ICD-10-CM

## 2022-12-02 DIAGNOSIS — Z950 Presence of cardiac pacemaker: Secondary | ICD-10-CM | POA: Diagnosis not present

## 2022-12-02 DIAGNOSIS — I495 Sick sinus syndrome: Secondary | ICD-10-CM | POA: Diagnosis not present

## 2022-12-03 DIAGNOSIS — N2581 Secondary hyperparathyroidism of renal origin: Secondary | ICD-10-CM | POA: Diagnosis not present

## 2022-12-03 DIAGNOSIS — N186 End stage renal disease: Secondary | ICD-10-CM | POA: Diagnosis not present

## 2022-12-03 DIAGNOSIS — D631 Anemia in chronic kidney disease: Secondary | ICD-10-CM | POA: Diagnosis not present

## 2022-12-03 DIAGNOSIS — Z992 Dependence on renal dialysis: Secondary | ICD-10-CM | POA: Diagnosis not present

## 2022-12-03 DIAGNOSIS — K31811 Angiodysplasia of stomach and duodenum with bleeding: Secondary | ICD-10-CM | POA: Diagnosis not present

## 2022-12-03 DIAGNOSIS — D509 Iron deficiency anemia, unspecified: Secondary | ICD-10-CM | POA: Diagnosis not present

## 2022-12-05 DIAGNOSIS — N2581 Secondary hyperparathyroidism of renal origin: Secondary | ICD-10-CM | POA: Diagnosis not present

## 2022-12-05 DIAGNOSIS — N186 End stage renal disease: Secondary | ICD-10-CM | POA: Diagnosis not present

## 2022-12-05 DIAGNOSIS — D631 Anemia in chronic kidney disease: Secondary | ICD-10-CM | POA: Diagnosis not present

## 2022-12-05 DIAGNOSIS — D509 Iron deficiency anemia, unspecified: Secondary | ICD-10-CM | POA: Diagnosis not present

## 2022-12-05 DIAGNOSIS — K31811 Angiodysplasia of stomach and duodenum with bleeding: Secondary | ICD-10-CM | POA: Diagnosis not present

## 2022-12-05 DIAGNOSIS — Z992 Dependence on renal dialysis: Secondary | ICD-10-CM | POA: Diagnosis not present

## 2022-12-07 DIAGNOSIS — N2581 Secondary hyperparathyroidism of renal origin: Secondary | ICD-10-CM | POA: Diagnosis not present

## 2022-12-07 DIAGNOSIS — D509 Iron deficiency anemia, unspecified: Secondary | ICD-10-CM | POA: Diagnosis not present

## 2022-12-07 DIAGNOSIS — D631 Anemia in chronic kidney disease: Secondary | ICD-10-CM | POA: Diagnosis not present

## 2022-12-07 DIAGNOSIS — K31811 Angiodysplasia of stomach and duodenum with bleeding: Secondary | ICD-10-CM | POA: Diagnosis not present

## 2022-12-07 DIAGNOSIS — Z992 Dependence on renal dialysis: Secondary | ICD-10-CM | POA: Diagnosis not present

## 2022-12-07 DIAGNOSIS — N186 End stage renal disease: Secondary | ICD-10-CM | POA: Diagnosis not present

## 2022-12-10 DIAGNOSIS — D631 Anemia in chronic kidney disease: Secondary | ICD-10-CM | POA: Diagnosis not present

## 2022-12-10 DIAGNOSIS — N2581 Secondary hyperparathyroidism of renal origin: Secondary | ICD-10-CM | POA: Diagnosis not present

## 2022-12-10 DIAGNOSIS — K31811 Angiodysplasia of stomach and duodenum with bleeding: Secondary | ICD-10-CM | POA: Diagnosis not present

## 2022-12-10 DIAGNOSIS — D509 Iron deficiency anemia, unspecified: Secondary | ICD-10-CM | POA: Diagnosis not present

## 2022-12-10 DIAGNOSIS — Z992 Dependence on renal dialysis: Secondary | ICD-10-CM | POA: Diagnosis not present

## 2022-12-10 DIAGNOSIS — N186 End stage renal disease: Secondary | ICD-10-CM | POA: Diagnosis not present

## 2022-12-12 DIAGNOSIS — Z992 Dependence on renal dialysis: Secondary | ICD-10-CM | POA: Diagnosis not present

## 2022-12-12 DIAGNOSIS — D509 Iron deficiency anemia, unspecified: Secondary | ICD-10-CM | POA: Diagnosis not present

## 2022-12-12 DIAGNOSIS — N186 End stage renal disease: Secondary | ICD-10-CM | POA: Diagnosis not present

## 2022-12-12 DIAGNOSIS — D631 Anemia in chronic kidney disease: Secondary | ICD-10-CM | POA: Diagnosis not present

## 2022-12-12 DIAGNOSIS — E1122 Type 2 diabetes mellitus with diabetic chronic kidney disease: Secondary | ICD-10-CM | POA: Diagnosis not present

## 2022-12-12 DIAGNOSIS — K31811 Angiodysplasia of stomach and duodenum with bleeding: Secondary | ICD-10-CM | POA: Diagnosis not present

## 2022-12-12 DIAGNOSIS — N2581 Secondary hyperparathyroidism of renal origin: Secondary | ICD-10-CM | POA: Diagnosis not present

## 2022-12-14 DIAGNOSIS — N186 End stage renal disease: Secondary | ICD-10-CM | POA: Diagnosis not present

## 2022-12-14 DIAGNOSIS — N2581 Secondary hyperparathyroidism of renal origin: Secondary | ICD-10-CM | POA: Diagnosis not present

## 2022-12-14 DIAGNOSIS — D631 Anemia in chronic kidney disease: Secondary | ICD-10-CM | POA: Diagnosis not present

## 2022-12-14 DIAGNOSIS — D509 Iron deficiency anemia, unspecified: Secondary | ICD-10-CM | POA: Diagnosis not present

## 2022-12-14 DIAGNOSIS — Z992 Dependence on renal dialysis: Secondary | ICD-10-CM | POA: Diagnosis not present

## 2022-12-14 DIAGNOSIS — K31811 Angiodysplasia of stomach and duodenum with bleeding: Secondary | ICD-10-CM | POA: Diagnosis not present

## 2022-12-17 DIAGNOSIS — N2581 Secondary hyperparathyroidism of renal origin: Secondary | ICD-10-CM | POA: Diagnosis not present

## 2022-12-17 DIAGNOSIS — Z992 Dependence on renal dialysis: Secondary | ICD-10-CM | POA: Diagnosis not present

## 2022-12-17 DIAGNOSIS — K31811 Angiodysplasia of stomach and duodenum with bleeding: Secondary | ICD-10-CM | POA: Diagnosis not present

## 2022-12-17 DIAGNOSIS — N186 End stage renal disease: Secondary | ICD-10-CM | POA: Diagnosis not present

## 2022-12-17 DIAGNOSIS — D509 Iron deficiency anemia, unspecified: Secondary | ICD-10-CM | POA: Diagnosis not present

## 2022-12-17 DIAGNOSIS — D631 Anemia in chronic kidney disease: Secondary | ICD-10-CM | POA: Diagnosis not present

## 2022-12-19 DIAGNOSIS — Z992 Dependence on renal dialysis: Secondary | ICD-10-CM | POA: Diagnosis not present

## 2022-12-19 DIAGNOSIS — N2581 Secondary hyperparathyroidism of renal origin: Secondary | ICD-10-CM | POA: Diagnosis not present

## 2022-12-19 DIAGNOSIS — K31811 Angiodysplasia of stomach and duodenum with bleeding: Secondary | ICD-10-CM | POA: Diagnosis not present

## 2022-12-19 DIAGNOSIS — D509 Iron deficiency anemia, unspecified: Secondary | ICD-10-CM | POA: Diagnosis not present

## 2022-12-19 DIAGNOSIS — D631 Anemia in chronic kidney disease: Secondary | ICD-10-CM | POA: Diagnosis not present

## 2022-12-19 DIAGNOSIS — N186 End stage renal disease: Secondary | ICD-10-CM | POA: Diagnosis not present

## 2022-12-21 DIAGNOSIS — D509 Iron deficiency anemia, unspecified: Secondary | ICD-10-CM | POA: Diagnosis not present

## 2022-12-21 DIAGNOSIS — N186 End stage renal disease: Secondary | ICD-10-CM | POA: Diagnosis not present

## 2022-12-21 DIAGNOSIS — N2581 Secondary hyperparathyroidism of renal origin: Secondary | ICD-10-CM | POA: Diagnosis not present

## 2022-12-21 DIAGNOSIS — Z992 Dependence on renal dialysis: Secondary | ICD-10-CM | POA: Diagnosis not present

## 2022-12-21 DIAGNOSIS — K31811 Angiodysplasia of stomach and duodenum with bleeding: Secondary | ICD-10-CM | POA: Diagnosis not present

## 2022-12-21 DIAGNOSIS — D631 Anemia in chronic kidney disease: Secondary | ICD-10-CM | POA: Diagnosis not present

## 2022-12-24 DIAGNOSIS — D509 Iron deficiency anemia, unspecified: Secondary | ICD-10-CM | POA: Diagnosis not present

## 2022-12-24 DIAGNOSIS — Z992 Dependence on renal dialysis: Secondary | ICD-10-CM | POA: Diagnosis not present

## 2022-12-24 DIAGNOSIS — N186 End stage renal disease: Secondary | ICD-10-CM | POA: Diagnosis not present

## 2022-12-24 DIAGNOSIS — D631 Anemia in chronic kidney disease: Secondary | ICD-10-CM | POA: Diagnosis not present

## 2022-12-24 DIAGNOSIS — K31811 Angiodysplasia of stomach and duodenum with bleeding: Secondary | ICD-10-CM | POA: Diagnosis not present

## 2022-12-24 DIAGNOSIS — N2581 Secondary hyperparathyroidism of renal origin: Secondary | ICD-10-CM | POA: Diagnosis not present

## 2022-12-26 DIAGNOSIS — N186 End stage renal disease: Secondary | ICD-10-CM | POA: Diagnosis not present

## 2022-12-26 DIAGNOSIS — D631 Anemia in chronic kidney disease: Secondary | ICD-10-CM | POA: Diagnosis not present

## 2022-12-26 DIAGNOSIS — K31811 Angiodysplasia of stomach and duodenum with bleeding: Secondary | ICD-10-CM | POA: Diagnosis not present

## 2022-12-26 DIAGNOSIS — N2581 Secondary hyperparathyroidism of renal origin: Secondary | ICD-10-CM | POA: Diagnosis not present

## 2022-12-26 DIAGNOSIS — Z992 Dependence on renal dialysis: Secondary | ICD-10-CM | POA: Diagnosis not present

## 2022-12-26 DIAGNOSIS — D509 Iron deficiency anemia, unspecified: Secondary | ICD-10-CM | POA: Diagnosis not present

## 2022-12-28 DIAGNOSIS — K31811 Angiodysplasia of stomach and duodenum with bleeding: Secondary | ICD-10-CM | POA: Diagnosis not present

## 2022-12-28 DIAGNOSIS — D631 Anemia in chronic kidney disease: Secondary | ICD-10-CM | POA: Diagnosis not present

## 2022-12-28 DIAGNOSIS — N2581 Secondary hyperparathyroidism of renal origin: Secondary | ICD-10-CM | POA: Diagnosis not present

## 2022-12-28 DIAGNOSIS — N186 End stage renal disease: Secondary | ICD-10-CM | POA: Diagnosis not present

## 2022-12-28 DIAGNOSIS — D509 Iron deficiency anemia, unspecified: Secondary | ICD-10-CM | POA: Diagnosis not present

## 2022-12-28 DIAGNOSIS — Z992 Dependence on renal dialysis: Secondary | ICD-10-CM | POA: Diagnosis not present

## 2022-12-31 DIAGNOSIS — D509 Iron deficiency anemia, unspecified: Secondary | ICD-10-CM | POA: Diagnosis not present

## 2022-12-31 DIAGNOSIS — N186 End stage renal disease: Secondary | ICD-10-CM | POA: Diagnosis not present

## 2022-12-31 DIAGNOSIS — Z992 Dependence on renal dialysis: Secondary | ICD-10-CM | POA: Diagnosis not present

## 2022-12-31 DIAGNOSIS — D631 Anemia in chronic kidney disease: Secondary | ICD-10-CM | POA: Diagnosis not present

## 2022-12-31 DIAGNOSIS — K31811 Angiodysplasia of stomach and duodenum with bleeding: Secondary | ICD-10-CM | POA: Diagnosis not present

## 2022-12-31 DIAGNOSIS — N2581 Secondary hyperparathyroidism of renal origin: Secondary | ICD-10-CM | POA: Diagnosis not present

## 2023-01-02 DIAGNOSIS — D509 Iron deficiency anemia, unspecified: Secondary | ICD-10-CM | POA: Diagnosis not present

## 2023-01-02 DIAGNOSIS — D631 Anemia in chronic kidney disease: Secondary | ICD-10-CM | POA: Diagnosis not present

## 2023-01-02 DIAGNOSIS — Z992 Dependence on renal dialysis: Secondary | ICD-10-CM | POA: Diagnosis not present

## 2023-01-02 DIAGNOSIS — K31811 Angiodysplasia of stomach and duodenum with bleeding: Secondary | ICD-10-CM | POA: Diagnosis not present

## 2023-01-02 DIAGNOSIS — N2581 Secondary hyperparathyroidism of renal origin: Secondary | ICD-10-CM | POA: Diagnosis not present

## 2023-01-02 DIAGNOSIS — N186 End stage renal disease: Secondary | ICD-10-CM | POA: Diagnosis not present

## 2023-01-04 DIAGNOSIS — Z992 Dependence on renal dialysis: Secondary | ICD-10-CM | POA: Diagnosis not present

## 2023-01-04 DIAGNOSIS — N2581 Secondary hyperparathyroidism of renal origin: Secondary | ICD-10-CM | POA: Diagnosis not present

## 2023-01-04 DIAGNOSIS — K31811 Angiodysplasia of stomach and duodenum with bleeding: Secondary | ICD-10-CM | POA: Diagnosis not present

## 2023-01-04 DIAGNOSIS — D631 Anemia in chronic kidney disease: Secondary | ICD-10-CM | POA: Diagnosis not present

## 2023-01-04 DIAGNOSIS — N186 End stage renal disease: Secondary | ICD-10-CM | POA: Diagnosis not present

## 2023-01-04 DIAGNOSIS — D509 Iron deficiency anemia, unspecified: Secondary | ICD-10-CM | POA: Diagnosis not present

## 2023-01-07 DIAGNOSIS — Z992 Dependence on renal dialysis: Secondary | ICD-10-CM | POA: Diagnosis not present

## 2023-01-07 DIAGNOSIS — N2581 Secondary hyperparathyroidism of renal origin: Secondary | ICD-10-CM | POA: Diagnosis not present

## 2023-01-07 DIAGNOSIS — D509 Iron deficiency anemia, unspecified: Secondary | ICD-10-CM | POA: Diagnosis not present

## 2023-01-07 DIAGNOSIS — K31811 Angiodysplasia of stomach and duodenum with bleeding: Secondary | ICD-10-CM | POA: Diagnosis not present

## 2023-01-07 DIAGNOSIS — N186 End stage renal disease: Secondary | ICD-10-CM | POA: Diagnosis not present

## 2023-01-07 DIAGNOSIS — D631 Anemia in chronic kidney disease: Secondary | ICD-10-CM | POA: Diagnosis not present

## 2023-01-09 DIAGNOSIS — N2581 Secondary hyperparathyroidism of renal origin: Secondary | ICD-10-CM | POA: Diagnosis not present

## 2023-01-09 DIAGNOSIS — N186 End stage renal disease: Secondary | ICD-10-CM | POA: Diagnosis not present

## 2023-01-09 DIAGNOSIS — Z992 Dependence on renal dialysis: Secondary | ICD-10-CM | POA: Diagnosis not present

## 2023-01-09 DIAGNOSIS — D631 Anemia in chronic kidney disease: Secondary | ICD-10-CM | POA: Diagnosis not present

## 2023-01-09 DIAGNOSIS — K31811 Angiodysplasia of stomach and duodenum with bleeding: Secondary | ICD-10-CM | POA: Diagnosis not present

## 2023-01-09 DIAGNOSIS — D509 Iron deficiency anemia, unspecified: Secondary | ICD-10-CM | POA: Diagnosis not present

## 2023-01-10 DIAGNOSIS — E1142 Type 2 diabetes mellitus with diabetic polyneuropathy: Secondary | ICD-10-CM | POA: Diagnosis not present

## 2023-01-10 DIAGNOSIS — Z992 Dependence on renal dialysis: Secondary | ICD-10-CM | POA: Diagnosis not present

## 2023-01-10 DIAGNOSIS — I132 Hypertensive heart and chronic kidney disease with heart failure and with stage 5 chronic kidney disease, or end stage renal disease: Secondary | ICD-10-CM | POA: Diagnosis not present

## 2023-01-10 DIAGNOSIS — E1122 Type 2 diabetes mellitus with diabetic chronic kidney disease: Secondary | ICD-10-CM | POA: Diagnosis not present

## 2023-01-10 DIAGNOSIS — N186 End stage renal disease: Secondary | ICD-10-CM | POA: Diagnosis not present

## 2023-01-11 DIAGNOSIS — N186 End stage renal disease: Secondary | ICD-10-CM | POA: Diagnosis not present

## 2023-01-11 DIAGNOSIS — D509 Iron deficiency anemia, unspecified: Secondary | ICD-10-CM | POA: Diagnosis not present

## 2023-01-11 DIAGNOSIS — Z992 Dependence on renal dialysis: Secondary | ICD-10-CM | POA: Diagnosis not present

## 2023-01-11 DIAGNOSIS — N2581 Secondary hyperparathyroidism of renal origin: Secondary | ICD-10-CM | POA: Diagnosis not present

## 2023-01-11 DIAGNOSIS — K31811 Angiodysplasia of stomach and duodenum with bleeding: Secondary | ICD-10-CM | POA: Diagnosis not present

## 2023-01-11 DIAGNOSIS — D631 Anemia in chronic kidney disease: Secondary | ICD-10-CM | POA: Diagnosis not present

## 2023-01-14 DIAGNOSIS — Z992 Dependence on renal dialysis: Secondary | ICD-10-CM | POA: Diagnosis not present

## 2023-01-14 DIAGNOSIS — D509 Iron deficiency anemia, unspecified: Secondary | ICD-10-CM | POA: Diagnosis not present

## 2023-01-14 DIAGNOSIS — D631 Anemia in chronic kidney disease: Secondary | ICD-10-CM | POA: Diagnosis not present

## 2023-01-14 DIAGNOSIS — N186 End stage renal disease: Secondary | ICD-10-CM | POA: Diagnosis not present

## 2023-01-14 DIAGNOSIS — K31811 Angiodysplasia of stomach and duodenum with bleeding: Secondary | ICD-10-CM | POA: Diagnosis not present

## 2023-01-14 DIAGNOSIS — N2581 Secondary hyperparathyroidism of renal origin: Secondary | ICD-10-CM | POA: Diagnosis not present

## 2023-01-15 ENCOUNTER — Encounter: Payer: Medicare Other | Admitting: Registered Nurse

## 2023-01-16 DIAGNOSIS — N2581 Secondary hyperparathyroidism of renal origin: Secondary | ICD-10-CM | POA: Diagnosis not present

## 2023-01-16 DIAGNOSIS — Z992 Dependence on renal dialysis: Secondary | ICD-10-CM | POA: Diagnosis not present

## 2023-01-16 DIAGNOSIS — N186 End stage renal disease: Secondary | ICD-10-CM | POA: Diagnosis not present

## 2023-01-16 DIAGNOSIS — K31811 Angiodysplasia of stomach and duodenum with bleeding: Secondary | ICD-10-CM | POA: Diagnosis not present

## 2023-01-16 DIAGNOSIS — D509 Iron deficiency anemia, unspecified: Secondary | ICD-10-CM | POA: Diagnosis not present

## 2023-01-16 DIAGNOSIS — D631 Anemia in chronic kidney disease: Secondary | ICD-10-CM | POA: Diagnosis not present

## 2023-01-18 DIAGNOSIS — N2581 Secondary hyperparathyroidism of renal origin: Secondary | ICD-10-CM | POA: Diagnosis not present

## 2023-01-18 DIAGNOSIS — D631 Anemia in chronic kidney disease: Secondary | ICD-10-CM | POA: Diagnosis not present

## 2023-01-18 DIAGNOSIS — N186 End stage renal disease: Secondary | ICD-10-CM | POA: Diagnosis not present

## 2023-01-18 DIAGNOSIS — Z992 Dependence on renal dialysis: Secondary | ICD-10-CM | POA: Diagnosis not present

## 2023-01-18 DIAGNOSIS — K31811 Angiodysplasia of stomach and duodenum with bleeding: Secondary | ICD-10-CM | POA: Diagnosis not present

## 2023-01-18 DIAGNOSIS — D509 Iron deficiency anemia, unspecified: Secondary | ICD-10-CM | POA: Diagnosis not present

## 2023-01-21 DIAGNOSIS — Z992 Dependence on renal dialysis: Secondary | ICD-10-CM | POA: Diagnosis not present

## 2023-01-21 DIAGNOSIS — N2581 Secondary hyperparathyroidism of renal origin: Secondary | ICD-10-CM | POA: Diagnosis not present

## 2023-01-21 DIAGNOSIS — K31811 Angiodysplasia of stomach and duodenum with bleeding: Secondary | ICD-10-CM | POA: Diagnosis not present

## 2023-01-21 DIAGNOSIS — D631 Anemia in chronic kidney disease: Secondary | ICD-10-CM | POA: Diagnosis not present

## 2023-01-21 DIAGNOSIS — N186 End stage renal disease: Secondary | ICD-10-CM | POA: Diagnosis not present

## 2023-01-21 DIAGNOSIS — D509 Iron deficiency anemia, unspecified: Secondary | ICD-10-CM | POA: Diagnosis not present

## 2023-01-23 DIAGNOSIS — N2581 Secondary hyperparathyroidism of renal origin: Secondary | ICD-10-CM | POA: Diagnosis not present

## 2023-01-23 DIAGNOSIS — D509 Iron deficiency anemia, unspecified: Secondary | ICD-10-CM | POA: Diagnosis not present

## 2023-01-23 DIAGNOSIS — N186 End stage renal disease: Secondary | ICD-10-CM | POA: Diagnosis not present

## 2023-01-23 DIAGNOSIS — Z992 Dependence on renal dialysis: Secondary | ICD-10-CM | POA: Diagnosis not present

## 2023-01-23 DIAGNOSIS — D631 Anemia in chronic kidney disease: Secondary | ICD-10-CM | POA: Diagnosis not present

## 2023-01-23 DIAGNOSIS — K31811 Angiodysplasia of stomach and duodenum with bleeding: Secondary | ICD-10-CM | POA: Diagnosis not present

## 2023-01-25 DIAGNOSIS — N2581 Secondary hyperparathyroidism of renal origin: Secondary | ICD-10-CM | POA: Diagnosis not present

## 2023-01-25 DIAGNOSIS — D509 Iron deficiency anemia, unspecified: Secondary | ICD-10-CM | POA: Diagnosis not present

## 2023-01-25 DIAGNOSIS — D631 Anemia in chronic kidney disease: Secondary | ICD-10-CM | POA: Diagnosis not present

## 2023-01-25 DIAGNOSIS — Z992 Dependence on renal dialysis: Secondary | ICD-10-CM | POA: Diagnosis not present

## 2023-01-25 DIAGNOSIS — K31811 Angiodysplasia of stomach and duodenum with bleeding: Secondary | ICD-10-CM | POA: Diagnosis not present

## 2023-01-25 DIAGNOSIS — N186 End stage renal disease: Secondary | ICD-10-CM | POA: Diagnosis not present

## 2023-01-28 DIAGNOSIS — N2581 Secondary hyperparathyroidism of renal origin: Secondary | ICD-10-CM | POA: Diagnosis not present

## 2023-01-28 DIAGNOSIS — N186 End stage renal disease: Secondary | ICD-10-CM | POA: Diagnosis not present

## 2023-01-28 DIAGNOSIS — D509 Iron deficiency anemia, unspecified: Secondary | ICD-10-CM | POA: Diagnosis not present

## 2023-01-28 DIAGNOSIS — K31811 Angiodysplasia of stomach and duodenum with bleeding: Secondary | ICD-10-CM | POA: Diagnosis not present

## 2023-01-28 DIAGNOSIS — Z992 Dependence on renal dialysis: Secondary | ICD-10-CM | POA: Diagnosis not present

## 2023-01-28 DIAGNOSIS — D631 Anemia in chronic kidney disease: Secondary | ICD-10-CM | POA: Diagnosis not present

## 2023-01-30 DIAGNOSIS — D509 Iron deficiency anemia, unspecified: Secondary | ICD-10-CM | POA: Diagnosis not present

## 2023-01-30 DIAGNOSIS — K31811 Angiodysplasia of stomach and duodenum with bleeding: Secondary | ICD-10-CM | POA: Diagnosis not present

## 2023-01-30 DIAGNOSIS — N186 End stage renal disease: Secondary | ICD-10-CM | POA: Diagnosis not present

## 2023-01-30 DIAGNOSIS — D631 Anemia in chronic kidney disease: Secondary | ICD-10-CM | POA: Diagnosis not present

## 2023-01-30 DIAGNOSIS — Z992 Dependence on renal dialysis: Secondary | ICD-10-CM | POA: Diagnosis not present

## 2023-01-30 DIAGNOSIS — N2581 Secondary hyperparathyroidism of renal origin: Secondary | ICD-10-CM | POA: Diagnosis not present

## 2023-02-01 DIAGNOSIS — N186 End stage renal disease: Secondary | ICD-10-CM | POA: Diagnosis not present

## 2023-02-01 DIAGNOSIS — N2581 Secondary hyperparathyroidism of renal origin: Secondary | ICD-10-CM | POA: Diagnosis not present

## 2023-02-01 DIAGNOSIS — D631 Anemia in chronic kidney disease: Secondary | ICD-10-CM | POA: Diagnosis not present

## 2023-02-01 DIAGNOSIS — K31811 Angiodysplasia of stomach and duodenum with bleeding: Secondary | ICD-10-CM | POA: Diagnosis not present

## 2023-02-01 DIAGNOSIS — Z992 Dependence on renal dialysis: Secondary | ICD-10-CM | POA: Diagnosis not present

## 2023-02-01 DIAGNOSIS — D509 Iron deficiency anemia, unspecified: Secondary | ICD-10-CM | POA: Diagnosis not present

## 2023-02-04 DIAGNOSIS — D631 Anemia in chronic kidney disease: Secondary | ICD-10-CM | POA: Diagnosis not present

## 2023-02-04 DIAGNOSIS — N2581 Secondary hyperparathyroidism of renal origin: Secondary | ICD-10-CM | POA: Diagnosis not present

## 2023-02-04 DIAGNOSIS — N186 End stage renal disease: Secondary | ICD-10-CM | POA: Diagnosis not present

## 2023-02-04 DIAGNOSIS — Z992 Dependence on renal dialysis: Secondary | ICD-10-CM | POA: Diagnosis not present

## 2023-02-04 DIAGNOSIS — D509 Iron deficiency anemia, unspecified: Secondary | ICD-10-CM | POA: Diagnosis not present

## 2023-02-04 DIAGNOSIS — K31811 Angiodysplasia of stomach and duodenum with bleeding: Secondary | ICD-10-CM | POA: Diagnosis not present

## 2023-02-06 DIAGNOSIS — D509 Iron deficiency anemia, unspecified: Secondary | ICD-10-CM | POA: Diagnosis not present

## 2023-02-06 DIAGNOSIS — Z992 Dependence on renal dialysis: Secondary | ICD-10-CM | POA: Diagnosis not present

## 2023-02-06 DIAGNOSIS — D631 Anemia in chronic kidney disease: Secondary | ICD-10-CM | POA: Diagnosis not present

## 2023-02-06 DIAGNOSIS — N2581 Secondary hyperparathyroidism of renal origin: Secondary | ICD-10-CM | POA: Diagnosis not present

## 2023-02-06 DIAGNOSIS — K31811 Angiodysplasia of stomach and duodenum with bleeding: Secondary | ICD-10-CM | POA: Diagnosis not present

## 2023-02-06 DIAGNOSIS — N186 End stage renal disease: Secondary | ICD-10-CM | POA: Diagnosis not present

## 2023-02-08 DIAGNOSIS — Z992 Dependence on renal dialysis: Secondary | ICD-10-CM | POA: Diagnosis not present

## 2023-02-08 DIAGNOSIS — K31811 Angiodysplasia of stomach and duodenum with bleeding: Secondary | ICD-10-CM | POA: Diagnosis not present

## 2023-02-08 DIAGNOSIS — D631 Anemia in chronic kidney disease: Secondary | ICD-10-CM | POA: Diagnosis not present

## 2023-02-08 DIAGNOSIS — D509 Iron deficiency anemia, unspecified: Secondary | ICD-10-CM | POA: Diagnosis not present

## 2023-02-08 DIAGNOSIS — N186 End stage renal disease: Secondary | ICD-10-CM | POA: Diagnosis not present

## 2023-02-08 DIAGNOSIS — N2581 Secondary hyperparathyroidism of renal origin: Secondary | ICD-10-CM | POA: Diagnosis not present

## 2023-02-10 ENCOUNTER — Other Ambulatory Visit: Payer: Self-pay | Admitting: Cardiology

## 2023-02-10 DIAGNOSIS — N186 End stage renal disease: Secondary | ICD-10-CM | POA: Diagnosis not present

## 2023-02-10 DIAGNOSIS — I10 Essential (primary) hypertension: Secondary | ICD-10-CM

## 2023-02-10 DIAGNOSIS — Z992 Dependence on renal dialysis: Secondary | ICD-10-CM | POA: Diagnosis not present

## 2023-02-10 DIAGNOSIS — I2510 Atherosclerotic heart disease of native coronary artery without angina pectoris: Secondary | ICD-10-CM

## 2023-02-10 DIAGNOSIS — E1122 Type 2 diabetes mellitus with diabetic chronic kidney disease: Secondary | ICD-10-CM | POA: Diagnosis not present

## 2023-02-11 DIAGNOSIS — K31811 Angiodysplasia of stomach and duodenum with bleeding: Secondary | ICD-10-CM | POA: Diagnosis not present

## 2023-02-11 DIAGNOSIS — N2581 Secondary hyperparathyroidism of renal origin: Secondary | ICD-10-CM | POA: Diagnosis not present

## 2023-02-11 DIAGNOSIS — D631 Anemia in chronic kidney disease: Secondary | ICD-10-CM | POA: Diagnosis not present

## 2023-02-11 DIAGNOSIS — N186 End stage renal disease: Secondary | ICD-10-CM | POA: Diagnosis not present

## 2023-02-11 DIAGNOSIS — Z992 Dependence on renal dialysis: Secondary | ICD-10-CM | POA: Diagnosis not present

## 2023-02-11 DIAGNOSIS — D509 Iron deficiency anemia, unspecified: Secondary | ICD-10-CM | POA: Diagnosis not present

## 2023-02-13 DIAGNOSIS — D509 Iron deficiency anemia, unspecified: Secondary | ICD-10-CM | POA: Diagnosis not present

## 2023-02-13 DIAGNOSIS — D631 Anemia in chronic kidney disease: Secondary | ICD-10-CM | POA: Diagnosis not present

## 2023-02-13 DIAGNOSIS — Z992 Dependence on renal dialysis: Secondary | ICD-10-CM | POA: Diagnosis not present

## 2023-02-13 DIAGNOSIS — N2581 Secondary hyperparathyroidism of renal origin: Secondary | ICD-10-CM | POA: Diagnosis not present

## 2023-02-13 DIAGNOSIS — K31811 Angiodysplasia of stomach and duodenum with bleeding: Secondary | ICD-10-CM | POA: Diagnosis not present

## 2023-02-13 DIAGNOSIS — N186 End stage renal disease: Secondary | ICD-10-CM | POA: Diagnosis not present

## 2023-02-15 DIAGNOSIS — D509 Iron deficiency anemia, unspecified: Secondary | ICD-10-CM | POA: Diagnosis not present

## 2023-02-15 DIAGNOSIS — N2581 Secondary hyperparathyroidism of renal origin: Secondary | ICD-10-CM | POA: Diagnosis not present

## 2023-02-15 DIAGNOSIS — D631 Anemia in chronic kidney disease: Secondary | ICD-10-CM | POA: Diagnosis not present

## 2023-02-15 DIAGNOSIS — Z992 Dependence on renal dialysis: Secondary | ICD-10-CM | POA: Diagnosis not present

## 2023-02-15 DIAGNOSIS — K31811 Angiodysplasia of stomach and duodenum with bleeding: Secondary | ICD-10-CM | POA: Diagnosis not present

## 2023-02-15 DIAGNOSIS — N186 End stage renal disease: Secondary | ICD-10-CM | POA: Diagnosis not present

## 2023-02-18 DIAGNOSIS — N2581 Secondary hyperparathyroidism of renal origin: Secondary | ICD-10-CM | POA: Diagnosis not present

## 2023-02-18 DIAGNOSIS — D631 Anemia in chronic kidney disease: Secondary | ICD-10-CM | POA: Diagnosis not present

## 2023-02-18 DIAGNOSIS — K31811 Angiodysplasia of stomach and duodenum with bleeding: Secondary | ICD-10-CM | POA: Diagnosis not present

## 2023-02-18 DIAGNOSIS — D509 Iron deficiency anemia, unspecified: Secondary | ICD-10-CM | POA: Diagnosis not present

## 2023-02-18 DIAGNOSIS — Z992 Dependence on renal dialysis: Secondary | ICD-10-CM | POA: Diagnosis not present

## 2023-02-18 DIAGNOSIS — N186 End stage renal disease: Secondary | ICD-10-CM | POA: Diagnosis not present

## 2023-02-20 DIAGNOSIS — N186 End stage renal disease: Secondary | ICD-10-CM | POA: Diagnosis not present

## 2023-02-20 DIAGNOSIS — Z992 Dependence on renal dialysis: Secondary | ICD-10-CM | POA: Diagnosis not present

## 2023-02-20 DIAGNOSIS — N2581 Secondary hyperparathyroidism of renal origin: Secondary | ICD-10-CM | POA: Diagnosis not present

## 2023-02-20 DIAGNOSIS — D631 Anemia in chronic kidney disease: Secondary | ICD-10-CM | POA: Diagnosis not present

## 2023-02-20 DIAGNOSIS — D509 Iron deficiency anemia, unspecified: Secondary | ICD-10-CM | POA: Diagnosis not present

## 2023-02-20 DIAGNOSIS — K31811 Angiodysplasia of stomach and duodenum with bleeding: Secondary | ICD-10-CM | POA: Diagnosis not present

## 2023-02-21 DIAGNOSIS — E1142 Type 2 diabetes mellitus with diabetic polyneuropathy: Secondary | ICD-10-CM | POA: Diagnosis not present

## 2023-02-22 DIAGNOSIS — N186 End stage renal disease: Secondary | ICD-10-CM | POA: Diagnosis not present

## 2023-02-22 DIAGNOSIS — K31811 Angiodysplasia of stomach and duodenum with bleeding: Secondary | ICD-10-CM | POA: Diagnosis not present

## 2023-02-22 DIAGNOSIS — D509 Iron deficiency anemia, unspecified: Secondary | ICD-10-CM | POA: Diagnosis not present

## 2023-02-22 DIAGNOSIS — N2581 Secondary hyperparathyroidism of renal origin: Secondary | ICD-10-CM | POA: Diagnosis not present

## 2023-02-22 DIAGNOSIS — Z992 Dependence on renal dialysis: Secondary | ICD-10-CM | POA: Diagnosis not present

## 2023-02-22 DIAGNOSIS — D631 Anemia in chronic kidney disease: Secondary | ICD-10-CM | POA: Diagnosis not present

## 2023-02-25 DIAGNOSIS — N186 End stage renal disease: Secondary | ICD-10-CM | POA: Diagnosis not present

## 2023-02-25 DIAGNOSIS — Z992 Dependence on renal dialysis: Secondary | ICD-10-CM | POA: Diagnosis not present

## 2023-02-25 DIAGNOSIS — D509 Iron deficiency anemia, unspecified: Secondary | ICD-10-CM | POA: Diagnosis not present

## 2023-02-25 DIAGNOSIS — N2581 Secondary hyperparathyroidism of renal origin: Secondary | ICD-10-CM | POA: Diagnosis not present

## 2023-02-25 DIAGNOSIS — D631 Anemia in chronic kidney disease: Secondary | ICD-10-CM | POA: Diagnosis not present

## 2023-02-25 DIAGNOSIS — K31811 Angiodysplasia of stomach and duodenum with bleeding: Secondary | ICD-10-CM | POA: Diagnosis not present

## 2023-02-27 DIAGNOSIS — D509 Iron deficiency anemia, unspecified: Secondary | ICD-10-CM | POA: Diagnosis not present

## 2023-02-27 DIAGNOSIS — K31811 Angiodysplasia of stomach and duodenum with bleeding: Secondary | ICD-10-CM | POA: Diagnosis not present

## 2023-02-27 DIAGNOSIS — N186 End stage renal disease: Secondary | ICD-10-CM | POA: Diagnosis not present

## 2023-02-27 DIAGNOSIS — Z992 Dependence on renal dialysis: Secondary | ICD-10-CM | POA: Diagnosis not present

## 2023-02-27 DIAGNOSIS — N2581 Secondary hyperparathyroidism of renal origin: Secondary | ICD-10-CM | POA: Diagnosis not present

## 2023-02-27 DIAGNOSIS — D631 Anemia in chronic kidney disease: Secondary | ICD-10-CM | POA: Diagnosis not present

## 2023-02-28 ENCOUNTER — Encounter: Payer: Self-pay | Admitting: Registered Nurse

## 2023-02-28 ENCOUNTER — Encounter: Payer: Medicare Other | Attending: Registered Nurse | Admitting: Registered Nurse

## 2023-02-28 VITALS — BP 115/78 | HR 100 | Ht 72.0 in | Wt 180.0 lb

## 2023-02-28 DIAGNOSIS — G894 Chronic pain syndrome: Secondary | ICD-10-CM | POA: Diagnosis not present

## 2023-02-28 DIAGNOSIS — Z79891 Long term (current) use of opiate analgesic: Secondary | ICD-10-CM | POA: Insufficient documentation

## 2023-02-28 DIAGNOSIS — G546 Phantom limb syndrome with pain: Secondary | ICD-10-CM | POA: Diagnosis not present

## 2023-02-28 DIAGNOSIS — Z89611 Acquired absence of right leg above knee: Secondary | ICD-10-CM | POA: Diagnosis not present

## 2023-02-28 DIAGNOSIS — S88112D Complete traumatic amputation at level between knee and ankle, left lower leg, subsequent encounter: Secondary | ICD-10-CM | POA: Insufficient documentation

## 2023-02-28 DIAGNOSIS — M5416 Radiculopathy, lumbar region: Secondary | ICD-10-CM | POA: Insufficient documentation

## 2023-02-28 DIAGNOSIS — Z5181 Encounter for therapeutic drug level monitoring: Secondary | ICD-10-CM | POA: Diagnosis not present

## 2023-02-28 MED ORDER — TRAMADOL HCL 50 MG PO TABS: 50.0000 mg | ORAL_TABLET | Freq: Two times a day (BID) | ORAL | 0 refills | Status: DC | PRN

## 2023-02-28 NOTE — Progress Notes (Signed)
Subjective:    Patient ID: Alexander [name], male    DOB: [birth year], 62 y.o.   MRN: [id number]  HPI: Alexander [name] is a 62 y.o. male who returns for follow up appointment for chronic pain and medication refill. He states his pain is located in his lower back pain radiating into his bilateral lower extremities and  bilateral stump pain with ( Phantom Pain). He rates his pain 6. His current exercise regime is  performing stretching exercises.  Mr. Demuro Morphine equivalent is 10.00 MME.   His oxycodone was last filled on 11/26/2022, he uses it sparingly.   Last oral swab was performed on 11/15/2022, it was consistent.   We will prescribe Tramadol today, he will call or My- Chart this provider in two weeks with a update, he verbalies understanding.     Pain Inventory Average Pain 6 Pain Right Now 6 My pain is  phantom pain  In the last 24 hours, has pain interfered with the following? General activity 2 Relation with others 4 Enjoyment of life 7 What TIME of day is your pain at its worst? evening Sleep (in general) Fair  Pain is worse with:  laying down Pain improves with: medication Relief from Meds: 5  Family History  Problem Relation Age of Onset   Diabetes type II Mother    Hypertension Mother    Diabetes Mother    Liver cancer Father    Diabetes type II Sister    Breast cancer Sister    Diabetes Sister    Hypertension Sister    Diabetes type II Brother    Kidney failure Brother    Diabetes Brother    Hypertension Brother    Diabetes type II Sister    Social History   Socioeconomic History   Marital status: Married    Spouse name: Not on file   Number of children: 2   Years of education: Not on file   Highest education level: Not on file  Occupational History   Occupation: Disabled  Tobacco Use   Smoking status: Every Day    Types: Cigars    Passive exposure: Current   Smokeless tobacco: Never   Tobacco comments:    Smokes cigars daily, has not smoked  cigarettes since 2014  Vaping Use   Vaping Use: Never used  Substance and Sexual Activity   Alcohol use: Not Currently    Alcohol/week: 21.0 standard drinks of alcohol    Types: 21 Glasses of wine per week    Comment: 1/2 to a whole beer once a week as of 05/10/22   Drug use: No   Sexual activity: Not on file  Other Topics Concern   Not on file  Social History Narrative   Not on file   Social Determinants of Health   Financial Resource Strain: Not on file  Food Insecurity: Not on file  Transportation Needs: Not on file  Physical Activity: Not on file  Stress: Not on file  Social Connections: Not on file   Past Surgical History:  Procedure Laterality Date   A/V FISTULAGRAM Right 05/01/2021   Procedure: A/V FISTULAGRAM;  Surgeon: Maeola Harman, MD;  Location: [facility] INVASIVE CV LAB;  Service: Cardiovascular;  Laterality: Right;  failed fistulagram   ABDOMINAL AORTOGRAM W/LOWER EXTREMITY N/A 09/09/2017   Procedure: ABDOMINAL AORTOGRAM W/LOWER EXTREMITY;  Surgeon: Chuck Hint, MD;  Location: [facility] INVASIVE CV LAB;  Service: Cardiovascular;  Laterality: N/A;   ABDOMINAL AORTOGRAM W/LOWER EXTREMITY Right 11/11/2019  Procedure: ABDOMINAL AORTOGRAM W/LOWER EXTREMITY;  Surgeon: Cephus Shelling, MD;  Location: [facility] INVASIVE CV LAB;  Service: Cardiovascular;  Laterality: Right;   ABDOMINAL AORTOGRAM W/LOWER EXTREMITY N/A 01/26/2022   Procedure: ABDOMINAL AORTOGRAM W/LOWER EXTREMITY;  Surgeon: Yates Decamp, MD;  [facility]: [facility] INVASIVE CV LAB;  Service: Cardiovascular;  Laterality: N/A;   AMPUTATION Left 08/19/2013   Procedure: AMPUTATION RAY;  Surgeon: Harvie Junior, MD;  [facility] OR;  Service: Orthopedics;  Laterality: Left;  ray amputation left 5th   AMPUTATION Left 10/27/2013   Procedure: AMPUTATION DIGIT-LEFT 4TH TOE, 4th and 5th metatarsal.;  Surgeon: Chuck Hint, MD;  Location: [facility] OR;  Service: Vascular;  Laterality: Left;   AMPUTATION Left 11/04/2013    Procedure: LEFT FOOT TRANS-METATARSAL AMPUTATION WITH WOUND CLOSURE ;  Surgeon: Harvie Junior, MD;  [facility] OR;  Service: Orthopedics;  Laterality: Left;   AMPUTATION Right 09/10/2017   Procedure: RIGHT FOURTH AND FIFTH TOE AMPUTATION;  Surgeon: Chuck Hint, MD;  Location: [facility] OR;  Service: Vascular;  Laterality: Right;   AMPUTATION Right 02/19/2020   Procedure: AMPUTATION RIGHT 3RD TOE;  Surgeon: Chuck Hint, MD;  Location: [facility] OR;  Service: Vascular;  Laterality: Right;   AMPUTATION Right 03/11/2020   Procedure: RIGHT BELOW KNEE AMPUTATION;  Surgeon: Nadara Mustard, MD;  Location: [facility] OR;  Service: Orthopedics;  Laterality: Right;   AMPUTATION Right 01/31/2022   Procedure: RIGHT ABOVE KNEE AMPUTATION;  Surgeon: Nadara Mustard, MD;  Location: [facility] OR;  Service: Orthopedics;  Laterality: Right;   AMPUTATION Left 05/11/2022   Procedure: LEFT BELOW KNEE AMPUTATION;  Surgeon: Nadara Mustard, MD;  Location: [facility] OR;  Service: Orthopedics;  Laterality: Left;   APPLICATION OF WOUND VAC Right 02/19/2020   Procedure: Application Of Wound Vac Right foot;  Surgeon: Chuck Hint, MD;  Location: [facility] OR;  Service: Vascular;  Laterality: Right;   APPLICATION OF WOUND VAC Left 05/11/2022   Procedure: APPLICATION OF WOUND VAC;  Surgeon: Nadara Mustard, MD;  [facility] OR;  Service: Orthopedics;  Laterality: Left;   AV FISTULA PLACEMENT Right 03/27/2021   Procedure: RIGHT ARM ARTERIOVENOUS (AV) FISTULA;  Surgeon: Chuck Hint, MD;  Location: [facility] OR;  Service: Vascular;  Laterality: Right;   AV FISTULA PLACEMENT Right 05/10/2021   Procedure: RIGHT ARM ARTERIOVENOUS (AV) FISTULA;  Surgeon: Maeola Harman, MD;  Location: [facility] OR;  Service: Vascular;  Laterality: Right;   BACK SURGERY     BASCILIC VEIN TRANSPOSITION Right 08/11/2021   Procedure: RIGHT ARM SECOND STAGE BASILIC VEIN TRANSPOSITION;  Surgeon: Maeola Harman, MD;  Location: [facility] OR;  Service:  Vascular;  Laterality: Right;   BIOPSY  07/20/2020   Procedure: BIOPSY;  Surgeon: Lemar Lofty., MD;  Location: [facility] ENDOSCOPY;  Service: Gastroenterology;;   CARDIAC CATHETERIZATION  10/07/2014   Procedure: LEFT HEART CATH AND CORONARY ANGIOGRAPHY;  Surgeon: Pamella Pert, MD;  Location: [facility] CATH LAB;  Service: Cardiovascular;;   COLONOSCOPY W/ POLYPECTOMY     COLONOSCOPY WITH PROPOFOL N/A 05/25/2020   Procedure: COLONOSCOPY WITH PROPOFOL;  Surgeon: Iva Boop, MD;  Location: [facility] ENDOSCOPY;  Service: Endoscopy;  Laterality: N/A;   EMBOLECTOMY Right 09/12/2017   Procedure: Thrombectomy  & Redo Right Below Knee Popliteal Artery Bypass Graft  ;  Surgeon: Maeola Harman, MD;  Location: [facility] OR;  Service: Vascular;  Laterality: Right;   ENTEROSCOPY N/A 07/20/2020   Procedure: ENTEROSCOPY;  Surgeon: Lemar Lofty., MD;  Location:  [facility] ENDOSCOPY;  Service: Gastroenterology;  Laterality: N/A;   ESOPHAGOGASTRODUODENOSCOPY (EGD) WITH PROPOFOL N/A 05/24/2020   Procedure: ESOPHAGOGASTRODUODENOSCOPY (EGD) WITH PROPOFOL;  Surgeon: Meryl Dare, MD;  Location: [facility] ENDOSCOPY;  Service: Endoscopy;  Laterality: N/A;   EYE SURGERY Bilateral    cataract   FEMORAL-TIBIAL BYPASS GRAFT Left 10/27/2013   Procedure: BYPASS GRAFT LEFT FEMORAL- POSTERIOR TIBIAL ARTERY;  Surgeon: Chuck Hint, MD;  Location: [facility] OR;  Service: Vascular;  Laterality: Left;   FEMORAL-TIBIAL BYPASS GRAFT Right 09/10/2017   Procedure: RIGHT SUPERFICIAL  FEMORAL ARTERY-BELOW KNEE POPLITEAL ARTERY BYPASS GRAFT WITH VEIN;  Surgeon: Chuck Hint, MD;  Location: [facility] OR;  Service: Vascular;  Laterality: Right;   GIVENS CAPSULE STUDY N/A 07/20/2020   Procedure: GIVENS CAPSULE STUDY;  Surgeon: Lemar Lofty., MD;  Location: [facility] ENDOSCOPY;  Service: Gastroenterology;  Laterality: N/A;  capsule placed through scope into duodenum @ 0936   I & D EXTREMITY Left 10/27/2013   Procedure: IRRIGATION  AND DEBRIDEMENT EXTREMITY- LEFT FOOT;  Surgeon: Chuck Hint, MD;  Location: [facility] OR;  Service: Vascular;  Laterality: Left;   I & D EXTREMITY Right 01/26/2020   Procedure: IRRIGATION AND DEBRIDEMENT EXTREMITY RIGHT FOOT WOUND;  Surgeon: Chuck Hint, MD;  Location: [facility] OR;  Service: Vascular;  Laterality: Right;   INSERT / REPLACE / REMOVE PACEMAKER     INSERTION OF DIALYSIS CATHETER Right 11/03/2021   Procedure: RIGHT INTERNAL JUGULAR INSERTION OF TUNNELED DIALYSIS CATHETER;  Surgeon: Chuck Hint, MD;  Location: [facility] OR;  Service: Vascular;  Laterality: Right;   INTRAOPERATIVE ARTERIOGRAM Right 09/10/2017   Procedure: INTRA OPERATIVE ARTERIOGRAM;  Surgeon: Chuck Hint, MD;  Location: [facility] OR;  Service: Vascular;  Laterality: Right;   IR ANGIOGRAM FOLLOW UP STUDY  09/09/2017   IR FLUORO GUIDE CV LINE RIGHT  05/31/2020   IR LUMBAR DISC ASPIRATION W/IMG GUIDE  01/19/2022   IR US GUIDE VASC ACCESS RIGHT  06/01/2020   LEFT HEART CATH AND CORONARY ANGIOGRAPHY N/A 08/19/2020   Procedure: LEFT HEART CATH AND CORONARY ANGIOGRAPHY;  Surgeon: Yates Decamp, MD;  [facility] INVAS[facility] CV LAB;  Service: Cardiovascular;  Laterality: N/A;   LEFT HEART CATHETERIZATION WITH CORONARY ANGIOGRAM N/A 11/09/2014   Procedure: LEFT HEART CATHETERIZATION WITH CORONARY ANGIOGRAM;  Surgeon: Pamella Pert, MD;  Location: [facility] CATH LAB;  Service: Cardiovascular;  Laterality: N/A;   LOWER EXTREMITY ANGIOGRAM Right 11/08/2015   Procedure: Lower Extremity Angiogram;  Surgeon: Nada Libman, MD;  Location: [facility] INVASIVE CV LAB;  Service: Cardiovascular;  Laterality: Right;   LUMBAR LAMINECTOMY  06/13/2012   Procedure: MICRODISCECTOMY LUMBAR LAMINECTOMY;  Surgeon: Kerrin Champagne, MD;  [facility] OR;  Service: Orthopedics;  Laterality: N/A;  Central laminectomy L2-3, L3-4, L4-5   PACEMAKER INSERTION  10/08/2014   MDT Advisa MRI compatible dual chamber pacemaker implanted by Dr Graciela Husbands for syncope  with post-termination pauses   PERCUTANEOUS CORONARY STENT INTERVENTION (PCI-S)  11/09/2014   des to lad & distal circumflex         Dr  Jacinto Halim   PERIPHERAL VASCULAR BALLOON ANGIOPLASTY Right 11/11/2019   Procedure: PERIPHERAL VASCULAR BALLOON ANGIOPLASTY;  Surgeon: Cephus Shelling, MD;  Location: [facility] INVASIVE CV LAB;  Service: Cardiovascular;  Laterality: Right;  below knee popliteal, tibioperoneal trunk, posterior tibial arteries   PERIPHERAL VASCULAR CATHETERIZATION N/A 11/08/2015   Procedure: Abdominal Aortogram;  Surgeon: Nada Libman, MD;  [facility] INVASIVE CV LAB;  Service: Cardiovascular;  Laterality: N/A;  PERIPHERAL VASCULAR CATHETERIZATION Right 11/08/2015   Procedure: Peripheral Vascular Atherectomy;  Surgeon: Nada Libman, MD;  [facility] INVASIVE CV LAB;  Service: Cardiovascular;  Laterality: Right;   PERIPHERAL VASCULAR CATHETERIZATION N/A 10/10/2016   Procedure: Lower Extremity Angiography;  Surgeon: Maeola Harman, MD;  Location: [facility] INVASIVE CV LAB;  Service: Cardiovascular;  Laterality: N/A;   PERIPHERAL VASCULAR CATHETERIZATION Right 10/10/2016   Procedure: Peripheral Vascular Atherectomy;  Surgeon: Maeola Harman, MD;  Location: [facility] INVASIVE CV LAB;  Service: Cardiovascular;  Laterality: Right;  Popliteal   PERMANENT PACEMAKER INSERTION N/A 10/08/2014   Procedure: PERMANENT PACEMAKER INSERTION;  Surgeon: Duke Salvia, MD;  Location: [facility] CATH LAB;  Service: Cardiovascular;  Laterality: N/A;   RADIOLOGY WITH ANESTHESIA N/A 01/19/2022   Procedure: Disc Aspiration;  Surgeon: Baldemar Lenis, MD;  Location: [facility] OR;  Service: Radiology;  Laterality: N/A;   SUBMUCOSAL TATTOO INJECTION  07/20/2020   Procedure: SUBMUCOSAL TATTOO INJECTION;  Surgeon: Lemar Lofty., MD;  Location: [facility] ENDOSCOPY;  Service: Gastroenterology;;   TEE WITHOUT CARDIOVERSION N/A 10/27/2021   Procedure: TRANSESOPHAGEAL ECHOCARDIOGRAM (TEE);  Surgeon:  Yates Decamp, MD;  Location: [facility] ENDOSCOPY;  Service: Cardiovascular;  Laterality: N/A;   TEMPORARY PACEMAKER INSERTION Bilateral 10/07/2014   Procedure: TEMPORARY PACEMAKER INSERTION;  Surgeon: Pamella Pert, MD;  Location: [facility] CATH LAB;  Service: Cardiovascular;  Laterality: Bilateral;   WOUND DEBRIDEMENT Right 02/19/2020   Procedure: DEBRIDEMENT WOUND RIGHT FOOT;  Surgeon: Chuck Hint, MD;  Location: [facility] OR;  Service: Vascular;  Laterality: Right;   Past Surgical History:  Procedure Laterality Date   A/V FISTULAGRAM Right 05/01/2021   Procedure: A/V FISTULAGRAM;  Surgeon: Maeola Harman, MD;  Location: [facility] INVASIVE CV LAB;  Service: Cardiovascular;  Laterality: Right;  failed fistulagram   ABDOMINAL AORTOGRAM W/LOWER EXTREMITY N/A 09/09/2017   Procedure: ABDOMINAL AORTOGRAM W/LOWER EXTREMITY;  Surgeon: Chuck Hint, MD;  Location: [facility] INVASIVE CV LAB;  Service: Cardiovascular;  Laterality: N/A;   ABDOMINAL AORTOGRAM W/LOWER EXTREMITY Right 11/11/2019   Procedure: ABDOMINAL AORTOGRAM W/LOWER EXTREMITY;  Surgeon: Cephus Shelling, MD;  [facility] INVASIVE CV LAB;  Service: Cardiovascular;  Laterality: Right;   ABDOMINAL AORTOGRAM W/LOWER EXTREMITY N/A 01/26/2022   Procedure: ABDOMINAL AORTOGRAM W/LOWER EXTREMITY;  Surgeon: Yates Decamp, MD;  [facility] INVASIVE CV LAB;  Service: Cardiovascular;  Laterality: N/A;   AMPUTATION Left 08/19/2013   Procedure: AMPUTATION RAY;  Surgeon: Harvie Junior, MD;  [facility] OR;  Service: Orthopedics;  Laterality: Left;  ray amputation left 5th   AMPUTATION Left 10/27/2013   Procedure: AMPUTATION DIGIT-LEFT 4TH TOE, 4th and 5th metatarsal.;  Surgeon: Chuck Hint, MD;  Location: [facility] OR;  Service: Vascular;  Laterality: Left;   AMPUTATION Left 11/04/2013   Procedure: LEFT FOOT TRANS-METATARSAL AMPUTATION WITH WOUND CLOSURE ;  Surgeon: Harvie Junior, MD;  Location[facility] OR;  Service: Orthopedics;  Laterality: Left;    AMPUTATION Right 09/10/2017   Procedure: RIGHT FOURTH AND FIFTH TOE AMPUTATION;  Surgeon: Chuck Hint, MD;  Location: [facility] OR;  Service: Vascular;  Laterality: Right;   AMPUTATION Right 02/19/2020   Procedure: AMPUTATION RIGHT 3RD TOE;  Surgeon: Chuck Hint, MD;  Location: [facility] OR;  Service: Vascular;  Laterality: Right;   AMPUTATION Right 03/11/2020   Procedure: RIGHT BELOW KNEE AMPUTATION;  Surgeon: Nadara Mustard, MD;  Location: [facility] OR;  Service: Orthopedics;  Laterality: Right;   AMPUTATION Right 01/31/2022   Procedure: RIGHT ABOVE KNEE AMPUTATION;  Surgeon: Nadara Mustard, MD;  Location: [facility] OR;  Service: Orthopedics;  Laterality: Right;   AMPUTATION Left 05/11/2022   Procedure: LEFT BELOW KNEE AMPUTATION;  Surgeon: Nadara Mustard, MD;  Location: [facility] OR;  Service: Orthopedics;  Laterality: Left;   APPLICATION OF WOUND VAC Right 02/19/2020   Procedure: Application Of Wound Vac Right foot;  Surgeon: Chuck Hint, MD;  Location: [facility] OR;  Service: Vascular;  Laterality: Right;   APPLICATION OF WOUND VAC Left 05/11/2022   Procedure: APPLICATION OF WOUND VAC;  Surgeon: Nadara Mustard, MD;  [facility] OR;  Service: Orthopedics;  Laterality: Left;   AV FISTULA PLACEMENT Right 03/27/2021   Procedure: RIGHT ARM ARTERIOVENOUS (AV) FISTULA;  Surgeon: Chuck Hint, MD;  Location: [facility] OR;  Service: Vascular;  Laterality: Right;   AV FISTULA PLACEMENT Right 05/10/2021   Procedure: RIGHT ARM ARTERIOVENOUS (AV) FISTULA;  Surgeon: Maeola Harman, MD;  Location: [facility] OR;  Service: Vascular;  Laterality: Right;   BACK SURGERY     BASCILIC VEIN TRANSPOSITION Right 08/11/2021   Procedure: RIGHT ARM SECOND STAGE BASILIC VEIN TRANSPOSITION;  Surgeon: Maeola Harman, MD;  Location: [facility] OR;  Service: Vascular;  Laterality: Right;   BIOPSY  07/20/2020   Procedure: BIOPSY;  Surgeon: Lemar Lofty., MD;  Location: [facility] ENDOSCOPY;  Service: Gastroenterology;;    CARDIAC CATHETERIZATION  10/07/2014   Procedure: LEFT HEART CATH AND CORONARY ANGIOGRAPHY;  Surgeon: Pamella Pert, MD;  Location: [facility] CATH LAB;  Service: Cardiovascular;;   COLONOSCOPY W/ POLYPECTOMY     COLONOSCOPY WITH PROPOFOL N/A 05/25/2020   Procedure: COLONOSCOPY WITH PROPOFOL;  Surgeon: Iva Boop, MD;  Location: [facility] ENDOSCOPY;  Service: Endoscopy;  Laterality: N/A;   EMBOLECTOMY Right 09/12/2017   Procedure: Thrombectomy  & Redo Right Below Knee Popliteal Artery Bypass Graft  ;  Surgeon: Maeola Harman, MD;  Location: [facility] OR;  Service: Vascular;  Laterality: Right;   ENTEROSCOPY N/A 07/20/2020   Procedure: ENTEROSCOPY;  Surgeon: Meridee Score Netty Starring., MD;  Location: [facility] ENDOSCOPY;  Service: Gastroenterology;  Laterality: N/A;   ESOPHAGOGASTRODUODENOSCOPY (EGD) WITH PROPOFOL N/A 05/24/2020   Procedure: ESOPHAGOGASTRODUODENOSCOPY (EGD) WITH PROPOFOL;  Surgeon: Meryl Dare, MD;  Location: [facility] ENDOSCOPY;  Service: Endoscopy;  Laterality: N/A;   EYE SURGERY Bilateral    cataract   FEMORAL-TIBIAL BYPASS GRAFT Left 10/27/2013   Procedure: BYPASS GRAFT LEFT FEMORAL- POSTERIOR TIBIAL ARTERY;  Surgeon: Chuck Hint, MD;  Location: [facility] OR;  Service: Vascular;  Laterality: Left;   FEMORAL-TIBIAL BYPASS GRAFT Right 09/10/2017   Procedure: RIGHT SUPERFICIAL  FEMORAL ARTERY-BELOW KNEE POPLITEAL ARTERY BYPASS GRAFT WITH VEIN;  Surgeon: Chuck Hint, MD;  Location: [facility] OR;  Service: Vascular;  Laterality: Right;   GIVENS CAPSULE STUDY N/A 07/20/2020   Procedure: GIVENS CAPSULE STUDY;  Surgeon: Lemar Lofty., MD;  Location: [facility] ENDOSCOPY;  Service: Gastroenterology;  Laterality: N/A;  capsule placed through scope into duodenum @ 0936   I & D EXTREMITY Left 10/27/2013   Procedure: IRRIGATION AND DEBRIDEMENT EXTREMITY- LEFT FOOT;  Surgeon: Chuck Hint, MD;  Location: [facility] OR;  Service: Vascular;  Laterality: Left;   I & D EXTREMITY Right 01/26/2020    Procedure: IRRIGATION AND DEBRIDEMENT EXTREMITY RIGHT FOOT WOUND;  Surgeon: Chuck Hint, MD;  Location: [facility] OR;  Service: Vascular;  Laterality: Right;   INSERT / REPLACE / REMOVE PACEMAKER     INSERTION OF DIALYSIS CATHETER Right 11/03/2021   Procedure: RIGHT INTERNAL JUGULAR INSERTION  OF TUNNELED DIALYSIS CATHETER;  Surgeon: Chuck Hint, MD;  Location: [facility] OR;  Service: Vascular;  Laterality: Right;   INTRAOPERATIVE ARTERIOGRAM Right 09/10/2017   Procedure: INTRA OPERATIVE ARTERIOGRAM;  Surgeon: Chuck Hint, MD;  Location: [facility] OR;  Service: Vascular;  Laterality: Right;   IR ANGIOGRAM FOLLOW UP STUDY  09/09/2017   IR FLUORO GUIDE CV LINE RIGHT  05/31/2020   IR LUMBAR DISC ASPIRATION W/IMG GUIDE  01/19/2022   IR US GUIDE VASC ACCESS RIGHT  06/01/2020   LEFT HEART CATH AND CORONARY ANGIOGRAPHY N/A 08/19/2020   Procedure: LEFT HEART CATH AND CORONARY ANGIOGRAPHY;  Surgeon: Yates Decamp, MD;  [facility] INVASIVE CV LAB;  Service: Cardiovascular;  Laterality: N/A;   LEFT HEART CATHETERIZATION WITH CORONARY ANGIOGRAM N/A 11/09/2014   Procedure: LEFT HEART CATHETERIZATION WITH CORONARY ANGIOGRAM;  Surgeon: Pamella Pert, MD;  Location: [facility] CATH LAB;  Service: Cardiovascular;  Laterality: N/A;   LOWER EXTREMITY ANGIOGRAM Right 11/08/2015   Procedure: Lower Extremity Angiogram;  Surgeon: Nada Libman, MD;  Location: [facility] INVASIVE CV LAB;  Service: Cardiovascular;  Laterality: Right;   LUMBAR LAMINECTOMY  06/13/2012   Procedure: MICRODISCECTOMY LUMBAR LAMINECTOMY;  Surgeon: Kerrin Champagne, MD;  Location[facility] OR;  Service: Orthopedics;  Laterality: N/A;  Central laminectomy L2-3, L3-4, L4-5   PACEMAKER INSERTION  10/08/2014   MDT Advisa MRI compatible dual chamber pacemaker implanted by Dr Graciela Husbands for syncope with post-termination pauses   PERCUTANEOUS CORONARY STENT INTERVENTION (PCI-S)  11/09/2014   des to lad & distal circumflex         Dr  Jacinto Halim   PERIPHERAL VASCULAR  BALLOON ANGIOPLASTY Right 11/11/2019   Procedure: PERIPHERAL VASCULAR BALLOON ANGIOPLASTY;  Surgeon: Cephus Shelling, MD;  Location: [facility] INVASIVE CV LAB;  Service: Cardiovascular;  Laterality: Right;  below knee popliteal, tibioperoneal trunk, posterior tibial arteries   PERIPHERAL VASCULAR CATHETERIZATION N/A 11/08/2015   Procedure: Abdominal Aortogram;  Surgeon: Nada Libman, MD;  [facility] INVASIVE CV LAB;  Service: Cardiovascular;  Laterality: N/A;   PERIPHERAL VASCULAR CATHETERIZATION Right 11/08/2015   Procedure: Peripheral Vascular Atherectomy;  Surgeon: Nada Libman, MD;  [facility] INVASIVE CV LAB;  Service: Cardiovascular;  Laterality: Right;   PERIPHERAL VASCULAR CATHETERIZATION N/A 10/10/2016   Procedure: Lower Extremity Angiography;  Surgeon: Maeola Harman, MD;  Location: [facility] INVASIVE CV LAB;  Service: Cardiovascular;  Laterality: N/A;   PERIPHERAL VASCULAR CATHETERIZATION Right 10/10/2016   Procedure: Peripheral Vascular Atherectomy;  Surgeon: Maeola Harman, MD;  Location: [facility] INVASIVE CV LAB;  Service: Cardiovascular;  Laterality: Right;  Popliteal   PERMANENT PACEMAKER INSERTION N/A 10/08/2014   Procedure: PERMANENT PACEMAKER INSERTION;  Surgeon: Duke Salvia, MD;  Location: [facility] CATH LAB;  Service: Cardiovascular;  Laterality: N/A;   RADIOLOGY WITH ANESTHESIA N/A 01/19/2022   Procedure: Disc Aspiration;  Surgeon: Baldemar Lenis, MD;  Location: [facility] OR;  Service: Radiology;  Laterality: N/A;   SUBMUCOSAL TATTOO INJECTION  07/20/2020   Procedure: SUBMUCOSAL TATTOO INJECTION;  Surgeon: Lemar Lofty., MD;  Location: [facility] ENDOSCOPY;  Service: Gastroenterology;;   TEE WITHOUT CARDIOVERSION N/A 10/27/2021   Procedure: TRANSESOPHAGEAL ECHOCARDIOGRAM (TEE);  Surgeon: Yates Decamp, MD;  Location: [facility] ENDOSCOPY;  Service: Cardiovascular;  Laterality: N/A;   TEMPORARY PACEMAKER INSERTION Bilateral 10/07/2014   Procedure: TEMPORARY PACEMAKER  INSERTION;  Surgeon: Pamella Pert, MD;  Location: [facility] CATH LAB;  Service: Cardiovascular;  Laterality: Bilateral;   WOUND DEBRIDEMENT Right 02/19/2020   Procedure: DEBRIDEMENT WOUND RIGHT  FOOT;  Surgeon: Chuck Hint, MD;  Location: [facility] OR;  Service: Vascular;  Laterality: Right;   Past Medical History:  Diagnosis Date   Acute blood loss anemia    Alcohol abuse    Anemia    Anemia of chronic disease    Arthritis    "patient does not think so."   Atrial fibrillation (HCC)    Cardiac syncope 10/07/2014   rapid A fib with 8 sec pauses on converison with syncope- temp pacing wire placed then PPM   Cataract    BILATERAL   Coronary artery disease    with stents   Diabetes mellitus    dx---been  awhile   Elevated LFTs    Endocarditis of tricuspid valve    ESRD (end stage renal disease) (HCC)    M/W/F   Gangrene of right foot (HCC)    GERD (gastroesophageal reflux disease)    History of blood transfusion    Hyperlipemia    Hypertension    Malnutrition (HCC)    Osteomyelitis (HCC)    right foot   Osteomyelitis of left foot (HCC) 08/19/2013   Peripheral vascular disease (HCC)    Pneumonia    Presence of permanent cardiac pacemaker 10/08/2014   [employer]   BP (!) 151/81 Comment: patient did not take bp meds this morning  Pulse 100   Ht 6' (1.829 m)   Wt 180 lb (81.6 kg)   SpO2 98%   BMI 24.41 kg/m   Opioid Risk Score:   Fall Risk Score:  `1  Depression screen [place] [facility]     02/28/2023    8:49 AM 08/07/2022   10:28 AM 05/10/2022    9:54 AM 01/18/2014    2:54 PM 11/20/2013    2:02 PM  Depression screen PHQ 2/9  Decreased Interest 0 0 0 0 0  Down, Depressed, Hopeless 0 0 0 0 0  PHQ - 2 Score 0 0 0 0 0  Altered sleeping   0    Tired, decreased energy   2    Change in appetite   3    Feeling bad or failure about yourself    0    Trouble concentrating   0    Moving slowly or fidgety/restless   0    Suicidal thoughts   0    PHQ-9 Score   5       Review of  Systems  Musculoskeletal:  Positive for back pain.       Phantom leg pain  All other systems reviewed and are negative.      Objective:   Physical Exam Vitals and nursing note reviewed.  Constitutional:      Appearance: Normal appearance.  Cardiovascular:     Rate and Rhythm: Normal rate and regular rhythm.     Pulses: Normal pulses.     Heart sounds: Normal heart sounds.  Musculoskeletal:     Cervical back: Normal range of motion and neck supple.     Comments: Normal Muscle Bulk and Muscle Testing Reveals:  Upper Extremities: Full ROM and Muscle Strength 5/5  Lower Extremities " Right AKA Left BKA Arrived in wheelchair     Skin:    General: Skin is warm and dry.     Comments: Dry scabbed on Left stump: No drainage noted:  site cleansed  Neurological:     Mental Status: He is alert and oriented to person, place, and time.  Psychiatric:        Mood  and Affect: Mood normal.        Behavior: Behavior normal.         Assessment & Plan:  Lumbar Radiculitis: Continue HEP as Tolerated. Continue Gabapentin as prescribed. Continue to Monitor. -02/28/2023  Left BKA  with good healing : Dry scabbed noted, no drainage. Site cleansed, he will F/U with Dr Lajoyce Corners. He verbalizes understanding.. Ortho following. Continue to Monitor. 02/28/2023 Phantom pain: Continue gabapentin . Continue to monitor. 02/28/2023 Phantom pain: Rx: Tramadol 50 mg twice a day as needed for pain #60. Call office in two weeks with update on medication change. oxycodone 5mg /325  one tablet every 12 hours as needed for pain #30, was discontinued.. We will continue the opioid monitoring program, this consists of regular clinic visits, examinations, urine drug screen, pill counts as well as use of [place] Controlled Substance Reporting system. A 12 month History has been reviewed on the [place] Controlled Substance Reporting System on 02/28/2023 ESRD On Hemodialysis :  Nephrology Following.     F/U in 6  months

## 2023-02-28 NOTE — Patient Instructions (Addendum)
I sent Tramadol prescription to the Pharmacy.   Do Not Take Oxycodone any more. Lock up your Oxycodone 4 tablets in lock box   Start Tramadol as needed for pain   Call office or send My- Chart in two weeks to discuss Medication change.

## 2023-03-01 DIAGNOSIS — N186 End stage renal disease: Secondary | ICD-10-CM | POA: Diagnosis not present

## 2023-03-01 DIAGNOSIS — D631 Anemia in chronic kidney disease: Secondary | ICD-10-CM | POA: Diagnosis not present

## 2023-03-01 DIAGNOSIS — Z992 Dependence on renal dialysis: Secondary | ICD-10-CM | POA: Diagnosis not present

## 2023-03-01 DIAGNOSIS — K31811 Angiodysplasia of stomach and duodenum with bleeding: Secondary | ICD-10-CM | POA: Diagnosis not present

## 2023-03-01 DIAGNOSIS — N2581 Secondary hyperparathyroidism of renal origin: Secondary | ICD-10-CM | POA: Diagnosis not present

## 2023-03-01 DIAGNOSIS — D509 Iron deficiency anemia, unspecified: Secondary | ICD-10-CM | POA: Diagnosis not present

## 2023-03-03 DIAGNOSIS — Z45018 Encounter for adjustment and management of other part of cardiac pacemaker: Secondary | ICD-10-CM | POA: Diagnosis not present

## 2023-03-03 DIAGNOSIS — I495 Sick sinus syndrome: Secondary | ICD-10-CM | POA: Diagnosis not present

## 2023-03-04 DIAGNOSIS — K31811 Angiodysplasia of stomach and duodenum with bleeding: Secondary | ICD-10-CM | POA: Diagnosis not present

## 2023-03-04 DIAGNOSIS — D631 Anemia in chronic kidney disease: Secondary | ICD-10-CM | POA: Diagnosis not present

## 2023-03-04 DIAGNOSIS — N186 End stage renal disease: Secondary | ICD-10-CM | POA: Diagnosis not present

## 2023-03-04 DIAGNOSIS — D509 Iron deficiency anemia, unspecified: Secondary | ICD-10-CM | POA: Diagnosis not present

## 2023-03-04 DIAGNOSIS — N2581 Secondary hyperparathyroidism of renal origin: Secondary | ICD-10-CM | POA: Diagnosis not present

## 2023-03-04 DIAGNOSIS — Z992 Dependence on renal dialysis: Secondary | ICD-10-CM | POA: Diagnosis not present

## 2023-03-06 DIAGNOSIS — D509 Iron deficiency anemia, unspecified: Secondary | ICD-10-CM | POA: Diagnosis not present

## 2023-03-06 DIAGNOSIS — D631 Anemia in chronic kidney disease: Secondary | ICD-10-CM | POA: Diagnosis not present

## 2023-03-06 DIAGNOSIS — N186 End stage renal disease: Secondary | ICD-10-CM | POA: Diagnosis not present

## 2023-03-06 DIAGNOSIS — K31811 Angiodysplasia of stomach and duodenum with bleeding: Secondary | ICD-10-CM | POA: Diagnosis not present

## 2023-03-06 DIAGNOSIS — Z992 Dependence on renal dialysis: Secondary | ICD-10-CM | POA: Diagnosis not present

## 2023-03-06 DIAGNOSIS — N2581 Secondary hyperparathyroidism of renal origin: Secondary | ICD-10-CM | POA: Diagnosis not present

## 2023-03-08 DIAGNOSIS — N2581 Secondary hyperparathyroidism of renal origin: Secondary | ICD-10-CM | POA: Diagnosis not present

## 2023-03-08 DIAGNOSIS — D631 Anemia in chronic kidney disease: Secondary | ICD-10-CM | POA: Diagnosis not present

## 2023-03-08 DIAGNOSIS — D509 Iron deficiency anemia, unspecified: Secondary | ICD-10-CM | POA: Diagnosis not present

## 2023-03-08 DIAGNOSIS — N186 End stage renal disease: Secondary | ICD-10-CM | POA: Diagnosis not present

## 2023-03-08 DIAGNOSIS — Z992 Dependence on renal dialysis: Secondary | ICD-10-CM | POA: Diagnosis not present

## 2023-03-08 DIAGNOSIS — K31811 Angiodysplasia of stomach and duodenum with bleeding: Secondary | ICD-10-CM | POA: Diagnosis not present

## 2023-03-11 DIAGNOSIS — K31811 Angiodysplasia of stomach and duodenum with bleeding: Secondary | ICD-10-CM | POA: Diagnosis not present

## 2023-03-11 DIAGNOSIS — D509 Iron deficiency anemia, unspecified: Secondary | ICD-10-CM | POA: Diagnosis not present

## 2023-03-11 DIAGNOSIS — N2581 Secondary hyperparathyroidism of renal origin: Secondary | ICD-10-CM | POA: Diagnosis not present

## 2023-03-11 DIAGNOSIS — N186 End stage renal disease: Secondary | ICD-10-CM | POA: Diagnosis not present

## 2023-03-11 DIAGNOSIS — Z992 Dependence on renal dialysis: Secondary | ICD-10-CM | POA: Diagnosis not present

## 2023-03-11 DIAGNOSIS — D631 Anemia in chronic kidney disease: Secondary | ICD-10-CM | POA: Diagnosis not present

## 2023-03-12 DIAGNOSIS — N186 End stage renal disease: Secondary | ICD-10-CM | POA: Diagnosis not present

## 2023-03-12 DIAGNOSIS — E1122 Type 2 diabetes mellitus with diabetic chronic kidney disease: Secondary | ICD-10-CM | POA: Diagnosis not present

## 2023-03-12 DIAGNOSIS — I132 Hypertensive heart and chronic kidney disease with heart failure and with stage 5 chronic kidney disease, or end stage renal disease: Secondary | ICD-10-CM | POA: Diagnosis not present

## 2023-03-12 DIAGNOSIS — Z992 Dependence on renal dialysis: Secondary | ICD-10-CM | POA: Diagnosis not present

## 2023-03-13 DIAGNOSIS — N2581 Secondary hyperparathyroidism of renal origin: Secondary | ICD-10-CM | POA: Diagnosis not present

## 2023-03-13 DIAGNOSIS — D631 Anemia in chronic kidney disease: Secondary | ICD-10-CM | POA: Diagnosis not present

## 2023-03-13 DIAGNOSIS — K31811 Angiodysplasia of stomach and duodenum with bleeding: Secondary | ICD-10-CM | POA: Diagnosis not present

## 2023-03-13 DIAGNOSIS — D509 Iron deficiency anemia, unspecified: Secondary | ICD-10-CM | POA: Diagnosis not present

## 2023-03-13 DIAGNOSIS — N186 End stage renal disease: Secondary | ICD-10-CM | POA: Diagnosis not present

## 2023-03-13 DIAGNOSIS — Z992 Dependence on renal dialysis: Secondary | ICD-10-CM | POA: Diagnosis not present

## 2023-03-15 DIAGNOSIS — N2581 Secondary hyperparathyroidism of renal origin: Secondary | ICD-10-CM | POA: Diagnosis not present

## 2023-03-15 DIAGNOSIS — D631 Anemia in chronic kidney disease: Secondary | ICD-10-CM | POA: Diagnosis not present

## 2023-03-15 DIAGNOSIS — N186 End stage renal disease: Secondary | ICD-10-CM | POA: Diagnosis not present

## 2023-03-15 DIAGNOSIS — K31811 Angiodysplasia of stomach and duodenum with bleeding: Secondary | ICD-10-CM | POA: Diagnosis not present

## 2023-03-15 DIAGNOSIS — D509 Iron deficiency anemia, unspecified: Secondary | ICD-10-CM | POA: Diagnosis not present

## 2023-03-15 DIAGNOSIS — Z992 Dependence on renal dialysis: Secondary | ICD-10-CM | POA: Diagnosis not present

## 2023-03-18 DIAGNOSIS — K31811 Angiodysplasia of stomach and duodenum with bleeding: Secondary | ICD-10-CM | POA: Diagnosis not present

## 2023-03-18 DIAGNOSIS — N186 End stage renal disease: Secondary | ICD-10-CM | POA: Diagnosis not present

## 2023-03-18 DIAGNOSIS — D509 Iron deficiency anemia, unspecified: Secondary | ICD-10-CM | POA: Diagnosis not present

## 2023-03-18 DIAGNOSIS — N2581 Secondary hyperparathyroidism of renal origin: Secondary | ICD-10-CM | POA: Diagnosis not present

## 2023-03-18 DIAGNOSIS — D631 Anemia in chronic kidney disease: Secondary | ICD-10-CM | POA: Diagnosis not present

## 2023-03-18 DIAGNOSIS — Z992 Dependence on renal dialysis: Secondary | ICD-10-CM | POA: Diagnosis not present

## 2023-03-20 DIAGNOSIS — K31811 Angiodysplasia of stomach and duodenum with bleeding: Secondary | ICD-10-CM | POA: Diagnosis not present

## 2023-03-20 DIAGNOSIS — D509 Iron deficiency anemia, unspecified: Secondary | ICD-10-CM | POA: Diagnosis not present

## 2023-03-20 DIAGNOSIS — Z992 Dependence on renal dialysis: Secondary | ICD-10-CM | POA: Diagnosis not present

## 2023-03-20 DIAGNOSIS — N2581 Secondary hyperparathyroidism of renal origin: Secondary | ICD-10-CM | POA: Diagnosis not present

## 2023-03-20 DIAGNOSIS — N186 End stage renal disease: Secondary | ICD-10-CM | POA: Diagnosis not present

## 2023-03-20 DIAGNOSIS — D631 Anemia in chronic kidney disease: Secondary | ICD-10-CM | POA: Diagnosis not present

## 2023-03-22 DIAGNOSIS — D631 Anemia in chronic kidney disease: Secondary | ICD-10-CM | POA: Diagnosis not present

## 2023-03-22 DIAGNOSIS — K31811 Angiodysplasia of stomach and duodenum with bleeding: Secondary | ICD-10-CM | POA: Diagnosis not present

## 2023-03-22 DIAGNOSIS — N2581 Secondary hyperparathyroidism of renal origin: Secondary | ICD-10-CM | POA: Diagnosis not present

## 2023-03-22 DIAGNOSIS — D509 Iron deficiency anemia, unspecified: Secondary | ICD-10-CM | POA: Diagnosis not present

## 2023-03-22 DIAGNOSIS — Z992 Dependence on renal dialysis: Secondary | ICD-10-CM | POA: Diagnosis not present

## 2023-03-22 DIAGNOSIS — N186 End stage renal disease: Secondary | ICD-10-CM | POA: Diagnosis not present

## 2023-03-25 DIAGNOSIS — D509 Iron deficiency anemia, unspecified: Secondary | ICD-10-CM | POA: Diagnosis not present

## 2023-03-25 DIAGNOSIS — N186 End stage renal disease: Secondary | ICD-10-CM | POA: Diagnosis not present

## 2023-03-25 DIAGNOSIS — Z992 Dependence on renal dialysis: Secondary | ICD-10-CM | POA: Diagnosis not present

## 2023-03-25 DIAGNOSIS — K31811 Angiodysplasia of stomach and duodenum with bleeding: Secondary | ICD-10-CM | POA: Diagnosis not present

## 2023-03-25 DIAGNOSIS — N2581 Secondary hyperparathyroidism of renal origin: Secondary | ICD-10-CM | POA: Diagnosis not present

## 2023-03-25 DIAGNOSIS — D631 Anemia in chronic kidney disease: Secondary | ICD-10-CM | POA: Diagnosis not present

## 2023-03-27 DIAGNOSIS — N186 End stage renal disease: Secondary | ICD-10-CM | POA: Diagnosis not present

## 2023-03-27 DIAGNOSIS — K31811 Angiodysplasia of stomach and duodenum with bleeding: Secondary | ICD-10-CM | POA: Diagnosis not present

## 2023-03-27 DIAGNOSIS — N2581 Secondary hyperparathyroidism of renal origin: Secondary | ICD-10-CM | POA: Diagnosis not present

## 2023-03-27 DIAGNOSIS — Z992 Dependence on renal dialysis: Secondary | ICD-10-CM | POA: Diagnosis not present

## 2023-03-27 DIAGNOSIS — D509 Iron deficiency anemia, unspecified: Secondary | ICD-10-CM | POA: Diagnosis not present

## 2023-03-27 DIAGNOSIS — D631 Anemia in chronic kidney disease: Secondary | ICD-10-CM | POA: Diagnosis not present

## 2023-03-28 ENCOUNTER — Ambulatory Visit: Payer: Medicare Other | Admitting: Orthopedic Surgery

## 2023-03-28 DIAGNOSIS — Z89512 Acquired absence of left leg below knee: Secondary | ICD-10-CM

## 2023-03-28 DIAGNOSIS — S88112A Complete traumatic amputation at level between knee and ankle, left lower leg, initial encounter: Secondary | ICD-10-CM

## 2023-03-29 DIAGNOSIS — D509 Iron deficiency anemia, unspecified: Secondary | ICD-10-CM | POA: Diagnosis not present

## 2023-03-29 DIAGNOSIS — Z992 Dependence on renal dialysis: Secondary | ICD-10-CM | POA: Diagnosis not present

## 2023-03-29 DIAGNOSIS — K31811 Angiodysplasia of stomach and duodenum with bleeding: Secondary | ICD-10-CM | POA: Diagnosis not present

## 2023-03-29 DIAGNOSIS — N2581 Secondary hyperparathyroidism of renal origin: Secondary | ICD-10-CM | POA: Diagnosis not present

## 2023-03-29 DIAGNOSIS — N186 End stage renal disease: Secondary | ICD-10-CM | POA: Diagnosis not present

## 2023-03-29 DIAGNOSIS — D631 Anemia in chronic kidney disease: Secondary | ICD-10-CM | POA: Diagnosis not present

## 2023-03-30 ENCOUNTER — Encounter (HOSPITAL_COMMUNITY): Payer: Self-pay | Admitting: *Deleted

## 2023-04-01 DIAGNOSIS — D631 Anemia in chronic kidney disease: Secondary | ICD-10-CM | POA: Diagnosis not present

## 2023-04-01 DIAGNOSIS — Z992 Dependence on renal dialysis: Secondary | ICD-10-CM | POA: Diagnosis not present

## 2023-04-01 DIAGNOSIS — K31811 Angiodysplasia of stomach and duodenum with bleeding: Secondary | ICD-10-CM | POA: Diagnosis not present

## 2023-04-01 DIAGNOSIS — N2581 Secondary hyperparathyroidism of renal origin: Secondary | ICD-10-CM | POA: Diagnosis not present

## 2023-04-01 DIAGNOSIS — D509 Iron deficiency anemia, unspecified: Secondary | ICD-10-CM | POA: Diagnosis not present

## 2023-04-01 DIAGNOSIS — N186 End stage renal disease: Secondary | ICD-10-CM | POA: Diagnosis not present

## 2023-04-03 DIAGNOSIS — K31811 Angiodysplasia of stomach and duodenum with bleeding: Secondary | ICD-10-CM | POA: Diagnosis not present

## 2023-04-03 DIAGNOSIS — N2581 Secondary hyperparathyroidism of renal origin: Secondary | ICD-10-CM | POA: Diagnosis not present

## 2023-04-03 DIAGNOSIS — D509 Iron deficiency anemia, unspecified: Secondary | ICD-10-CM | POA: Diagnosis not present

## 2023-04-03 DIAGNOSIS — Z992 Dependence on renal dialysis: Secondary | ICD-10-CM | POA: Diagnosis not present

## 2023-04-03 DIAGNOSIS — D631 Anemia in chronic kidney disease: Secondary | ICD-10-CM | POA: Diagnosis not present

## 2023-04-03 DIAGNOSIS — N186 End stage renal disease: Secondary | ICD-10-CM | POA: Diagnosis not present

## 2023-04-05 DIAGNOSIS — Z992 Dependence on renal dialysis: Secondary | ICD-10-CM | POA: Diagnosis not present

## 2023-04-05 DIAGNOSIS — N2581 Secondary hyperparathyroidism of renal origin: Secondary | ICD-10-CM | POA: Diagnosis not present

## 2023-04-05 DIAGNOSIS — K31811 Angiodysplasia of stomach and duodenum with bleeding: Secondary | ICD-10-CM | POA: Diagnosis not present

## 2023-04-05 DIAGNOSIS — D509 Iron deficiency anemia, unspecified: Secondary | ICD-10-CM | POA: Diagnosis not present

## 2023-04-05 DIAGNOSIS — D631 Anemia in chronic kidney disease: Secondary | ICD-10-CM | POA: Diagnosis not present

## 2023-04-05 DIAGNOSIS — N186 End stage renal disease: Secondary | ICD-10-CM | POA: Diagnosis not present

## 2023-04-08 ENCOUNTER — Encounter: Payer: Self-pay | Admitting: Orthopedic Surgery

## 2023-04-08 DIAGNOSIS — N2581 Secondary hyperparathyroidism of renal origin: Secondary | ICD-10-CM | POA: Diagnosis not present

## 2023-04-08 DIAGNOSIS — D631 Anemia in chronic kidney disease: Secondary | ICD-10-CM | POA: Diagnosis not present

## 2023-04-08 DIAGNOSIS — D509 Iron deficiency anemia, unspecified: Secondary | ICD-10-CM | POA: Diagnosis not present

## 2023-04-08 DIAGNOSIS — N186 End stage renal disease: Secondary | ICD-10-CM | POA: Diagnosis not present

## 2023-04-08 DIAGNOSIS — Z992 Dependence on renal dialysis: Secondary | ICD-10-CM | POA: Diagnosis not present

## 2023-04-08 DIAGNOSIS — K31811 Angiodysplasia of stomach and duodenum with bleeding: Secondary | ICD-10-CM | POA: Diagnosis not present

## 2023-04-08 NOTE — Progress Notes (Signed)
Office Visit Note   Patient: Alexander [name]           Date of Birth: Dec 27, 1960           MRN: [id number] Visit Date: 03/28/2023              Requested by: Gaspar Garbe, MD [address] [place],  [place] [address] PCP: Gaspar Garbe, MD  Chief Complaint  Patient presents with   Left Leg - Follow-up    HX left BKA 6/30/203      HPI: Patient is a 62 year old gentleman 1 year status post left transtibial amputation.  Patient states that he developed an ulcer about a month ago.  Patient currently ambulating in a wheelchair.  Positive history of smoking.  Assessment & Plan: Visit Diagnoses:  1. Below-knee amputation of left lower extremity, initial encounter (HCC)     Plan: Recommended Dial soap cleansing wicking the wound open use 4 x 4 and Ace wrap and a shrinker.  Follow-Up Instructions: No follow-ups on file.   Ortho Exam  Patient is alert, oriented, no adenopathy, well-dressed, normal affect, normal respiratory effort. Examination there is no cellulitis in the residual limb.  Patient has a ulcer which is 5 mm in diameter after debridement there is a small drop of purulent drainage after debridement there was good granulation tissue and bleeding.  No evidence of residual infection.  Imaging: No results found. No images are attached to the encounter.  Labs: Lab Results  Component Value Date   HGBA1C 6.6 (H) 05/11/2022   HGBA1C 6.8 (H) 10/29/2021   HGBA1C 5.5 05/23/2020   ESRSEDRATE 25 (H) 01/17/2022   ESRSEDRATE 25 (H) 01/17/2022   ESRSEDRATE 30 (H) 01/16/2022   CRP 14.9 (H) 01/16/2022   CRP 14.0 (H) 01/15/2022   REPTSTATUS 01/24/2022 FINAL 01/19/2022   GRAMSTAIN  01/19/2022    RARE WBC PRESENT,BOTH PMN AND MONONUCLEAR NO ORGANISMS SEEN    CULT  01/19/2022    No growth aerobically or anaerobically. Performed at [facility], [address] [place], [place] [address]    LABORGA STREPTOCOCCUS MITIS/ORALIS 01/26/2020     Lab Results   Component Value Date   ALBUMIN 2.0 (L) 05/14/2022   ALBUMIN 2.2 (L) 05/13/2022   ALBUMIN 2.3 (L) 05/12/2022    Lab Results  Component Value Date   MG 1.7 01/13/2022   MG 1.9 07/19/2020   MG 1.8 05/25/2020   No results found for: "VD25OH"  No results found for: "PREALBUMIN"    Latest Ref Rng & Units 05/14/2022    1:14 PM 05/13/2022    1:04 AM 05/12/2022    1:41 AM  CBC EXTENDED  WBC 4.0 - 10.5 K/uL 9.0  8.8  8.6   RBC 4.22 - 5.81 MIL/uL 2.76  2.79  3.19   Hemoglobin 13.0 - 17.0 g/dL 7.6  7.6  9.0   HCT 82.9 - 52.0 % 25.0  25.6  28.6   Platelets 150 - 400 K/uL 122  126  117      There is no height or weight on file to calculate BMI.  Orders:  No orders of the defined types were placed in this encounter.  No orders of the defined types were placed in this encounter.    Procedures: No procedures performed  Clinical Data: No additional findings.  ROS:  All other systems negative, except as noted in the HPI. Review of Systems  Objective: Vital Signs: There were no vitals taken for  this visit.  Specialty Comments:  No specialty comments available.  PMFS History: Patient Active Problem List   Diagnosis Date Noted   H/O bacterial endocarditis 10/09/2022   Below-knee amputation of left lower extremity (HCC) 05/11/2022   History of right above knee amputation (HCC) 05/10/2022   Goals of care, counseling/discussion    Palliative care by specialist    Lumbar back pain with radiculopathy affecting left lower extremity 01/14/2022   Dyslipidemia 01/14/2022   Type 2 diabetes mellitus with chronic kidney disease, with long-term current use of insulin (HCC) 01/14/2022   GERD without esophagitis 01/14/2022   Pressure injury of skin 01/14/2022   Intractable back pain 01/13/2022   Severe protein-calorie malnutrition (HCC) 11/02/2021   Transaminitis    Thrombocytopenia (HCC) 10/26/2021   Hyponatremia 10/26/2021   Acute on chronic diastolic CHF (congestive heart failure)  (HCC) 10/26/2021   Atrial fibrillation with RVR (HCC) 10/25/2021   Symptomatic anemia 09/30/2021   ESRD on dialysis (HCC) 09/30/2021   PAF (paroxysmal atrial fibrillation) (HCC) 09/30/2021   NSVT (nonsustained ventricular tachycardia) (HCC)    Necrotizing glomerulonephritis 06/04/2020   Essential hypertension, benign 02/07/2015   S/P PTCA (percutaneous transluminal coronary angioplasty) 11/09/2014   Pacemaker: Medtronic Advisa DR MRI [id number] Dual chamber pacemaker 10/08/2014 10/08/2014   Encounter for care of pacemaker 10/08/2014   Sinus node dysfunction (HCC) 10/08/2014   Diabetes mellitus with stage 3 chronic kidney disease (HCC) 10/07/2014   CAD (coronary artery disease), native coronary artery 10/07/2014   Atherosclerosis of native arteries of the extremities with gangrene (HCC) 12/16/2013   PVD (peripheral vascular disease) (HCC) 10/27/2013   Anemia 10/21/2013   Spinal stenosis, lumbar region, with neurogenic claudication 06/12/2012    Class: Diagnosis of   Diabetes mellitus with end stage renal disease (HCC) 03/15/2009   ALCOHOL ABUSE 03/15/2009   TOBACCO USE 03/15/2009   HYPERTENSION 03/15/2009   Past Medical History:  Diagnosis Date   Acute blood loss anemia    Alcohol abuse    Anemia    Anemia of chronic disease    Arthritis    "patient does not think so."   Atrial fibrillation (HCC)    Cardiac syncope 10/07/2014   rapid A fib with 8 sec pauses on converison with syncope- temp pacing wire placed then PPM   Cataract    BILATERAL   Coronary artery disease    with stents   Diabetes mellitus    dx---been  awhile   Elevated LFTs    Endocarditis of tricuspid valve    ESRD (end stage renal disease) (HCC)    M/W/F   Gangrene of right foot (HCC)    GERD (gastroesophageal reflux disease)    History of blood transfusion    Hyperlipemia    Hypertension    Malnutrition (HCC)    Osteomyelitis (HCC)    right foot   Osteomyelitis of left foot (HCC) 08/19/2013   Peripheral  vascular disease (HCC)    Pneumonia    Presence of permanent cardiac pacemaker 10/08/2014   Medtronic    Family History  Problem Relation Age of Onset   Diabetes type II Mother    Hypertension Mother    Diabetes Mother    Liver cancer Father    Diabetes type II Sister    Breast cancer Sister    Diabetes Sister    Hypertension Sister    Diabetes type II Brother    Kidney failure Brother    Diabetes Brother    Hypertension Brother  Diabetes type II Sister     Past Surgical History:  Procedure Laterality Date   A/V FISTULAGRAM Right 05/01/2021   Procedure: A/V FISTULAGRAM;  Surgeon: Maeola Harman, MD;  Location[facility] INVASIVE CV LAB;  Service: Cardiovascular;  Laterality: Right;  failed fistulagram   ABDOMINAL AORTOGRAM W/LOWER EXTREMITY N/A 09/09/2017   Procedure: ABDOMINAL AORTOGRAM W/LOWER EXTREMITY;  Surgeon: Chuck Hint, MD;  Location: [facility] INVASIVE CV LAB;  Service: Cardiovascular;  Laterality: N/A;   ABDOMINAL AORTOGRAM W/LOWER EXTREMITY Right 11/11/2019   Procedure: ABDOMINAL AORTOGRAM W/LOWER EXTREMITY;  Surgeon: Cephus Shelling, MD;  Location: [facility] INVASIVE CV LAB;  Service: Cardiovascular;  Laterality: Right;   ABDOMINAL AORTOGRAM W/LOWER EXTREMITY N/A 01/26/2022   Procedure: ABDOMINAL AORTOGRAM W/LOWER EXTREMITY;  Surgeon: Yates Decamp, MD;  Location: [facility] INVASIVE CV LAB;  Service: Cardiovascular;  Laterality: N/A;   AMPUTATION Left 08/19/2013   Procedure: AMPUTATION RAY;  Surgeon: Harvie Junior, MD;  Location: [facility] OR;  Service: Orthopedics;  Laterality: Left;  ray amputation left 5th   AMPUTATION Left 10/27/2013   Procedure: AMPUTATION DIGIT-LEFT 4TH TOE, 4th and 5th metatarsal.;  Surgeon: Chuck Hint, MD;  Location: [facility] OR;  Service: Vascular;  Laterality: Left;   AMPUTATION Left 11/04/2013   Procedure: LEFT FOOT TRANS-METATARSAL AMPUTATION WITH WOUND CLOSURE ;  Surgeon: Harvie Junior, MD;  Location: [facility] OR;  Service: Orthopedics;  Laterality:  Left;   AMPUTATION Right 09/10/2017   Procedure: RIGHT FOURTH AND FIFTH TOE AMPUTATION;  Surgeon: Chuck Hint, MD;  Location: [facility] OR;  Service: Vascular;  Laterality: Right;   AMPUTATION Right 02/19/2020   Procedure: AMPUTATION RIGHT 3RD TOE;  Surgeon: Chuck Hint, MD;  Location: [facility] OR;  Service: Vascular;  Laterality: Right;   AMPUTATION Right 03/11/2020   Procedure: RIGHT BELOW KNEE AMPUTATION;  Surgeon: Nadara Mustard, MD;  Location: [facility] OR;  Service: Orthopedics;  Laterality: Right;   AMPUTATION Right 01/31/2022   Procedure: RIGHT ABOVE KNEE AMPUTATION;  Surgeon: Nadara Mustard, MD;  Location: [facility] OR;  Service: Orthopedics;  Laterality: Right;   AMPUTATION Left 05/11/2022   Procedure: LEFT BELOW KNEE AMPUTATION;  Surgeon: Nadara Mustard, MD;  Location: [facility] OR;  Service: Orthopedics;  Laterality: Left;   APPLICATION OF WOUND VAC Right 02/19/2020   Procedure: Application Of Wound Vac Right foot;  Surgeon: Chuck Hint, MD;  Location: [facility] OR;  Service: Vascular;  Laterality: Right;   APPLICATION OF WOUND VAC Left 05/11/2022   Procedure: APPLICATION OF WOUND VAC;  Surgeon: Nadara Mustard, MD;  Location: [facility] OR;  Service: Orthopedics;  Laterality: Left;   AV FISTULA PLACEMENT Right 03/27/2021   Procedure: RIGHT ARM ARTERIOVENOUS (AV) FISTULA;  Surgeon: Chuck Hint, MD;  Location: [facility] OR;  Service: Vascular;  Laterality: Right;   AV FISTULA PLACEMENT Right 05/10/2021   Procedure: RIGHT ARM ARTERIOVENOUS (AV) FISTULA;  Surgeon: Maeola Harman, MD;  Location: [facility] OR;  Service: Vascular;  Laterality: Right;   BACK SURGERY     BASCILIC VEIN TRANSPOSITION Right 08/11/2021   Procedure: RIGHT ARM SECOND STAGE BASILIC VEIN TRANSPOSITION;  Surgeon: Maeola Harman, MD;  Location: [facility] OR;  Service: Vascular;  Laterality: Right;   BIOPSY  07/20/2020   Procedure: BIOPSY;  Surgeon: Lemar Lofty., MD;  Location: [facility] ENDOSCOPY;  Service:  Gastroenterology;;   CARDIAC CATHETERIZATION  10/07/2014   Procedure: LEFT HEART CATH AND CORONARY ANGIOGRAPHY;  Surgeon: Pamella Pert, MD;  Location: [facility] CATH LAB;  Service: Cardiovascular;;  COLONOSCOPY W/ POLYPECTOMY     COLONOSCOPY WITH PROPOFOL N/A 05/25/2020   Procedure: COLONOSCOPY WITH PROPOFOL;  Surgeon: Iva Boop, MD;  Location: [facility] ENDOSCOPY;  Service: Endoscopy;  Laterality: N/A;   EMBOLECTOMY Right 09/12/2017   Procedure: Thrombectomy  & Redo Right Below Knee Popliteal Artery Bypass Graft  ;  Surgeon: Maeola Harman, MD;  Location: [facility] OR;  Service: Vascular;  Laterality: Right;   ENTEROSCOPY N/A 07/20/2020   Procedure: ENTEROSCOPY;  Surgeon: Meridee Score Netty Starring., MD;  Location: [facility] ENDOSCOPY;  Service: Gastroenterology;  Laterality: N/A;   ESOPHAGOGASTRODUODENOSCOPY (EGD) WITH PROPOFOL N/A 05/24/2020   Procedure: ESOPHAGOGASTRODUODENOSCOPY (EGD) WITH PROPOFOL;  Surgeon: Meryl Dare, MD;  Location: [facility] ENDOSCOPY;  Service: Endoscopy;  Laterality: N/A;   EYE SURGERY Bilateral    cataract   FEMORAL-TIBIAL BYPASS GRAFT Left 10/27/2013   Procedure: BYPASS GRAFT LEFT FEMORAL- POSTERIOR TIBIAL ARTERY;  Surgeon: Chuck Hint, MD;  Location: [facility] OR;  Service: Vascular;  Laterality: Left;   FEMORAL-TIBIAL BYPASS GRAFT Right 09/10/2017   Procedure: RIGHT SUPERFICIAL  FEMORAL ARTERY-BELOW KNEE POPLITEAL ARTERY BYPASS GRAFT WITH VEIN;  Surgeon: Chuck Hint, MD;  Location: [facility] OR;  Service: Vascular;  Laterality: Right;   GIVENS CAPSULE STUDY N/A 07/20/2020   Procedure: GIVENS CAPSULE STUDY;  Surgeon: Lemar Lofty., MD;  Location: [facility] ENDOSCOPY;  Service: Gastroenterology;  Laterality: N/A;  capsule placed through scope into duodenum @ 0936   I & D EXTREMITY Left 10/27/2013   Procedure: IRRIGATION AND DEBRIDEMENT EXTREMITY- LEFT FOOT;  Surgeon: Chuck Hint, MD;  Location: [facility] OR;  Service: Vascular;  Laterality: Left;   I & D  EXTREMITY Right 01/26/2020   Procedure: IRRIGATION AND DEBRIDEMENT EXTREMITY RIGHT FOOT WOUND;  Surgeon: Chuck Hint, MD;  Location: [facility] OR;  Service: Vascular;  Laterality: Right;   INSERT / REPLACE / REMOVE PACEMAKER     INSERTION OF DIALYSIS CATHETER Right 11/03/2021   Procedure: RIGHT INTERNAL JUGULAR INSERTION OF TUNNELED DIALYSIS CATHETER;  Surgeon: Chuck Hint, MD;  Location: [facility] OR;  Service: Vascular;  Laterality: Right;   INTRAOPERATIVE ARTERIOGRAM Right 09/10/2017   Procedure: INTRA OPERATIVE ARTERIOGRAM;  Surgeon: Chuck Hint, MD;  Location: [facility] OR;  Service: Vascular;  Laterality: Right;   IR ANGIOGRAM FOLLOW UP STUDY  09/09/2017   IR FLUORO GUIDE CV LINE RIGHT  05/31/2020   IR LUMBAR DISC ASPIRATION W/IMG GUIDE  01/19/2022   IR US GUIDE VASC ACCESS RIGHT  06/01/2020   LEFT HEART CATH AND CORONARY ANGIOGRAPHY N/A 08/19/2020   Procedure: LEFT HEART CATH AND CORONARY ANGIOGRAPHY;  Surgeon: Yates Decamp, MD;  Location: [facility] INVASIVE CV LAB;  Service: Cardiovascular;  Laterality: N/A;   LEFT HEART CATHETERIZATION WITH CORONARY ANGIOGRAM N/A 11/09/2014   Procedure: LEFT HEART CATHETERIZATION WITH CORONARY ANGIOGRAM;  Surgeon: Pamella Pert, MD;  Location: [facility] CATH LAB;  Service: Cardiovascular;  Laterality: N/A;   LOWER EXTREMITY ANGIOGRAM Right 11/08/2015   Procedure: Lower Extremity Angiogram;  Surgeon: Nada Libman, MD;  Location: [facility] INVASIVE CV LAB;  Service: Cardiovascular;  Laterality: Right;   LUMBAR LAMINECTOMY  06/13/2012   Procedure: MICRODISCECTOMY LUMBAR LAMINECTOMY;  Surgeon: Kerrin Champagne, MD;  Location: [facility] OR;  Service: Orthopedics;  Laterality: N/A;  Central laminectomy L2-3, L3-4, L4-5   PACEMAKER INSERTION  10/08/2014   MDT Advisa MRI compatible dual chamber pacemaker implanted by Dr Graciela Husbands for syncope with post-termination pauses   PERCUTANEOUS CORONARY STENT INTERVENTION (PCI-S)  11/09/2014   des to [employer]  distal circumflex         Dr   Jacinto Halim   PERIPHERAL VASCULAR BALLOON ANGIOPLASTY Right 11/11/2019   Procedure: PERIPHERAL VASCULAR BALLOON ANGIOPLASTY;  Surgeon: Cephus Shelling, MD;  Location: [facility] INVASIVE CV LAB;  Service: Cardiovascular;  Laterality: Right;  below knee popliteal, tibioperoneal trunk, posterior tibial arteries   PERIPHERAL VASCULAR CATHETERIZATION N/A 11/08/2015   Procedure: Abdominal Aortogram;  Surgeon: Nada Libman, MD;  Location: [facility] INVASIVE CV LAB;  Service: Cardiovascular;  Laterality: N/A;   PERIPHERAL VASCULAR CATHETERIZATION Right 11/08/2015   Procedure: Peripheral Vascular Atherectomy;  Surgeon: Nada Libman, MD;  Location: [facility] INVASIVE CV LAB;  Service: Cardiovascular;  Laterality: Right;   PERIPHERAL VASCULAR CATHETERIZATION N/A 10/10/2016   Procedure: Lower Extremity Angiography;  Surgeon: Maeola Harman, MD;  Location: [facility] INVASIVE CV LAB;  Service: Cardiovascular;  Laterality: N/A;   PERIPHERAL VASCULAR CATHETERIZATION Right 10/10/2016   Procedure: Peripheral Vascular Atherectomy;  Surgeon: Maeola Harman, MD;  Location: [facility] INVASIVE CV LAB;  Service: Cardiovascular;  Laterality: Right;  Popliteal   PERMANENT PACEMAKER INSERTION N/A 10/08/2014   Procedure: PERMANENT PACEMAKER INSERTION;  Surgeon: Duke Salvia, MD;  Location: [facility] CATH LAB;  Service: Cardiovascular;  Laterality: N/A;   RADIOLOGY WITH ANESTHESIA N/A 01/19/2022   Procedure: Disc Aspiration;  Surgeon: Baldemar Lenis, MD;  Location: [facility] OR;  Service: Radiology;  Laterality: N/A;   SUBMUCOSAL TATTOO INJECTION  07/20/2020   Procedure: SUBMUCOSAL TATTOO INJECTION;  Surgeon: Lemar Lofty., MD;  Location: [facility] ENDOSCOPY;  Service: Gastroenterology;;   TEE WITHOUT CARDIOVERSION N/A 10/27/2021   Procedure: TRANSESOPHAGEAL ECHOCARDIOGRAM (TEE);  Surgeon: Yates Decamp, MD;  Location: [facility] ENDOSCOPY;  Service: Cardiovascular;  Laterality: N/A;   TEMPORARY PACEMAKER INSERTION Bilateral 10/07/2014    Procedure: TEMPORARY PACEMAKER INSERTION;  Surgeon: Pamella Pert, MD;  Location: [facility] CATH LAB;  Service: Cardiovascular;  Laterality: Bilateral;   WOUND DEBRIDEMENT Right 02/19/2020   Procedure: DEBRIDEMENT WOUND RIGHT FOOT;  Surgeon: Chuck Hint, MD;  Location: [facility] OR;  Service: Vascular;  Laterality: Right;   Social History   Occupational History   Occupation: Disabled  Tobacco Use   Smoking status: Every Day    Types: Cigars    Passive exposure: Current   Smokeless tobacco: Never   Tobacco comments:    Smokes cigars daily, has not smoked cigarettes since 2014  Vaping Use   Vaping Use: Never used  Substance and Sexual Activity   Alcohol use: Not Currently    Alcohol/week: 21.0 standard drinks of alcohol    Types: 21 Glasses of wine per week    Comment: 1/2 to a whole beer once a week as of 05/10/22   Drug use: No   Sexual activity: Not on file

## 2023-04-09 ENCOUNTER — Ambulatory Visit: Payer: Medicare Other | Admitting: Cardiology

## 2023-04-10 DIAGNOSIS — N2581 Secondary hyperparathyroidism of renal origin: Secondary | ICD-10-CM | POA: Diagnosis not present

## 2023-04-10 DIAGNOSIS — D631 Anemia in chronic kidney disease: Secondary | ICD-10-CM | POA: Diagnosis not present

## 2023-04-10 DIAGNOSIS — N186 End stage renal disease: Secondary | ICD-10-CM | POA: Diagnosis not present

## 2023-04-10 DIAGNOSIS — D509 Iron deficiency anemia, unspecified: Secondary | ICD-10-CM | POA: Diagnosis not present

## 2023-04-10 DIAGNOSIS — K31811 Angiodysplasia of stomach and duodenum with bleeding: Secondary | ICD-10-CM | POA: Diagnosis not present

## 2023-04-10 DIAGNOSIS — Z992 Dependence on renal dialysis: Secondary | ICD-10-CM | POA: Diagnosis not present

## 2023-04-12 DIAGNOSIS — D631 Anemia in chronic kidney disease: Secondary | ICD-10-CM | POA: Diagnosis not present

## 2023-04-12 DIAGNOSIS — D509 Iron deficiency anemia, unspecified: Secondary | ICD-10-CM | POA: Diagnosis not present

## 2023-04-12 DIAGNOSIS — Z992 Dependence on renal dialysis: Secondary | ICD-10-CM | POA: Diagnosis not present

## 2023-04-12 DIAGNOSIS — N186 End stage renal disease: Secondary | ICD-10-CM | POA: Diagnosis not present

## 2023-04-12 DIAGNOSIS — E1122 Type 2 diabetes mellitus with diabetic chronic kidney disease: Secondary | ICD-10-CM | POA: Diagnosis not present

## 2023-04-12 DIAGNOSIS — K31811 Angiodysplasia of stomach and duodenum with bleeding: Secondary | ICD-10-CM | POA: Diagnosis not present

## 2023-04-12 DIAGNOSIS — N2581 Secondary hyperparathyroidism of renal origin: Secondary | ICD-10-CM | POA: Diagnosis not present

## 2023-04-15 DIAGNOSIS — Z992 Dependence on renal dialysis: Secondary | ICD-10-CM | POA: Diagnosis not present

## 2023-04-15 DIAGNOSIS — D509 Iron deficiency anemia, unspecified: Secondary | ICD-10-CM | POA: Diagnosis not present

## 2023-04-15 DIAGNOSIS — N2581 Secondary hyperparathyroidism of renal origin: Secondary | ICD-10-CM | POA: Diagnosis not present

## 2023-04-15 DIAGNOSIS — N186 End stage renal disease: Secondary | ICD-10-CM | POA: Diagnosis not present

## 2023-04-15 DIAGNOSIS — K31811 Angiodysplasia of stomach and duodenum with bleeding: Secondary | ICD-10-CM | POA: Diagnosis not present

## 2023-04-15 DIAGNOSIS — D631 Anemia in chronic kidney disease: Secondary | ICD-10-CM | POA: Diagnosis not present

## 2023-04-16 ENCOUNTER — Ambulatory Visit: Payer: Medicare Other | Admitting: Cardiology

## 2023-04-17 DIAGNOSIS — N2581 Secondary hyperparathyroidism of renal origin: Secondary | ICD-10-CM | POA: Diagnosis not present

## 2023-04-17 DIAGNOSIS — D509 Iron deficiency anemia, unspecified: Secondary | ICD-10-CM | POA: Diagnosis not present

## 2023-04-17 DIAGNOSIS — N186 End stage renal disease: Secondary | ICD-10-CM | POA: Diagnosis not present

## 2023-04-17 DIAGNOSIS — Z992 Dependence on renal dialysis: Secondary | ICD-10-CM | POA: Diagnosis not present

## 2023-04-17 DIAGNOSIS — D631 Anemia in chronic kidney disease: Secondary | ICD-10-CM | POA: Diagnosis not present

## 2023-04-17 DIAGNOSIS — K31811 Angiodysplasia of stomach and duodenum with bleeding: Secondary | ICD-10-CM | POA: Diagnosis not present

## 2023-04-19 DIAGNOSIS — D631 Anemia in chronic kidney disease: Secondary | ICD-10-CM | POA: Diagnosis not present

## 2023-04-19 DIAGNOSIS — N2581 Secondary hyperparathyroidism of renal origin: Secondary | ICD-10-CM | POA: Diagnosis not present

## 2023-04-19 DIAGNOSIS — Z992 Dependence on renal dialysis: Secondary | ICD-10-CM | POA: Diagnosis not present

## 2023-04-19 DIAGNOSIS — K31811 Angiodysplasia of stomach and duodenum with bleeding: Secondary | ICD-10-CM | POA: Diagnosis not present

## 2023-04-19 DIAGNOSIS — D509 Iron deficiency anemia, unspecified: Secondary | ICD-10-CM | POA: Diagnosis not present

## 2023-04-19 DIAGNOSIS — N186 End stage renal disease: Secondary | ICD-10-CM | POA: Diagnosis not present

## 2023-04-22 DIAGNOSIS — N186 End stage renal disease: Secondary | ICD-10-CM | POA: Diagnosis not present

## 2023-04-22 DIAGNOSIS — N2581 Secondary hyperparathyroidism of renal origin: Secondary | ICD-10-CM | POA: Diagnosis not present

## 2023-04-22 DIAGNOSIS — D509 Iron deficiency anemia, unspecified: Secondary | ICD-10-CM | POA: Diagnosis not present

## 2023-04-22 DIAGNOSIS — K31811 Angiodysplasia of stomach and duodenum with bleeding: Secondary | ICD-10-CM | POA: Diagnosis not present

## 2023-04-22 DIAGNOSIS — D631 Anemia in chronic kidney disease: Secondary | ICD-10-CM | POA: Diagnosis not present

## 2023-04-22 DIAGNOSIS — Z992 Dependence on renal dialysis: Secondary | ICD-10-CM | POA: Diagnosis not present

## 2023-04-24 DIAGNOSIS — K31811 Angiodysplasia of stomach and duodenum with bleeding: Secondary | ICD-10-CM | POA: Diagnosis not present

## 2023-04-24 DIAGNOSIS — N186 End stage renal disease: Secondary | ICD-10-CM | POA: Diagnosis not present

## 2023-04-24 DIAGNOSIS — D631 Anemia in chronic kidney disease: Secondary | ICD-10-CM | POA: Diagnosis not present

## 2023-04-24 DIAGNOSIS — D509 Iron deficiency anemia, unspecified: Secondary | ICD-10-CM | POA: Diagnosis not present

## 2023-04-24 DIAGNOSIS — Z992 Dependence on renal dialysis: Secondary | ICD-10-CM | POA: Diagnosis not present

## 2023-04-24 DIAGNOSIS — N2581 Secondary hyperparathyroidism of renal origin: Secondary | ICD-10-CM | POA: Diagnosis not present

## 2023-04-25 ENCOUNTER — Encounter: Payer: Medicare Other | Admitting: Orthopedic Surgery

## 2023-04-25 ENCOUNTER — Ambulatory Visit (INDEPENDENT_AMBULATORY_CARE_PROVIDER_SITE_OTHER): Payer: Medicare Other | Admitting: Orthopedic Surgery

## 2023-04-25 ENCOUNTER — Encounter: Payer: Self-pay | Admitting: Orthopedic Surgery

## 2023-04-25 DIAGNOSIS — S88112A Complete traumatic amputation at level between knee and ankle, left lower leg, initial encounter: Secondary | ICD-10-CM

## 2023-04-25 NOTE — Progress Notes (Signed)
Office Visit Note   Patient: Alexander [name]           Date of Birth: [birth year]           MRN: [id number] Visit Date: 04/25/2023              Requested by: Gaspar Garbe, MD [address] [place],  [place] [address] PCP: Gaspar Garbe, MD  Chief Complaint  Patient presents with   Left Leg - Follow-up    Hx left BKA 05/11/2022      HPI: Patient is a 62 year old gentleman he is 1 year status post left below-knee amputation.  Patient is also a right above-the-knee amputee and has a prosthesis for the right.  Assessment & Plan: Visit Diagnoses:  1. Below-knee amputation of left lower extremity, initial encounter [facility])     Plan: Patient was provided prescription for [employer] for a transtibial prosthesis on the left.  Will place an order for physical therapy upstairs.  Follow-Up Instructions: Return if symptoms worsen or fail to improve.   Ortho Exam  Patient is alert, oriented, no adenopathy, well-dressed, normal affect, normal respiratory effort. Examination patient has full active extension of the left knee the incision is well-healed the residual limb is well consolidated.  Patient is a new left transtibial  amputee.  Patient's current comorbidities are not expected to impact the ability to function with the prescribed prosthesis. Patient verbally communicates a strong desire to use a prosthesis. Patient currently requires mobility aids to ambulate without a prosthesis.  Expects not to use mobility aids with a new prosthesis.  Patient is a K2 level ambulator that will use a prosthesis to walk around their home and the community over low level environmental barriers.      Imaging: No results found. No images are attached to the encounter.  Labs: Lab Results  Component Value Date   HGBA1C 6.6 (H) 05/11/2022   HGBA1C 6.8 (H) 10/29/2021   HGBA1C 5.5 05/23/2020   ESRSEDRATE 25 (H) 01/17/2022   ESRSEDRATE 25 (H) 01/17/2022   ESRSEDRATE 30 (H) 01/16/2022    CRP 14.9 (H) 01/16/2022   CRP 14.0 (H) 01/15/2022   REPTSTATUS 01/24/2022 FINAL 01/19/2022   GRAMSTAIN  01/19/2022    RARE WBC PRESENT,BOTH PMN AND MONONUCLEAR NO ORGANISMS SEEN    CULT  01/19/2022    No growth aerobically or anaerobically. Performed at [facility], [address] [place], [place] [address]    LABORGA STREPTOCOCCUS MITIS/ORALIS 01/26/2020     Lab Results  Component Value Date   ALBUMIN 2.0 (L) 05/14/2022   ALBUMIN 2.2 (L) 05/13/2022   ALBUMIN 2.3 (L) 05/12/2022    Lab Results  Component Value Date   MG 1.7 01/13/2022   MG 1.9 07/19/2020   MG 1.8 05/25/2020   No results found for: "VD25OH"  No results found for: "PREALBUMIN"    Latest Ref Rng & Units 05/14/2022    1:14 PM 05/13/2022    1:04 AM 05/12/2022    1:41 AM  CBC EXTENDED  WBC 4.0 - 10.5 K/uL 9.0  8.8  8.6   RBC 4.22 - 5.81 MIL/uL 2.76  2.79  3.19   Hemoglobin 13.0 - 17.0 g/dL 7.6  7.6  9.0   HCT 82.9 - 52.0 % 25.0  25.6  28.6   Platelets 150 - 400 K/uL 122  126  117      There is no height or weight on file to calculate BMI.  Orders:  No orders of the defined types were placed in this encounter.  No orders of the defined types were placed in this encounter.    Procedures: No procedures performed  Clinical Data: No additional findings.  ROS:  All other systems negative, except as noted in the HPI. Review of Systems  Objective: Vital Signs: There were no vitals taken for this visit.  Specialty Comments:  No specialty comments available.  PMFS History: Patient Active Problem List   Diagnosis Date Noted   H/O bacterial endocarditis 10/09/2022   Below-knee amputation of left lower extremity (HCC) 05/11/2022   History of right above knee amputation (HCC) 05/10/2022   Goals of care, counseling/discussion    Palliative care by specialist    Lumbar back pain with radiculopathy affecting left lower extremity 01/14/2022   Dyslipidemia 01/14/2022   Type 2 diabetes mellitus  with chronic kidney disease, with long-term current use of insulin (HCC) 01/14/2022   GERD without esophagitis 01/14/2022   Pressure injury of skin 01/14/2022   Intractable back pain 01/13/2022   Severe protein-calorie malnutrition (HCC) 11/02/2021   Transaminitis    Thrombocytopenia (HCC) 10/26/2021   Hyponatremia 10/26/2021   Acute on chronic diastolic CHF (congestive heart failure) (HCC) 10/26/2021   Atrial fibrillation with RVR (HCC) 10/25/2021   Symptomatic anemia 09/30/2021   ESRD on dialysis (HCC) 09/30/2021   PAF (paroxysmal atrial fibrillation) (HCC) 09/30/2021   NSVT (nonsustained ventricular tachycardia) (HCC)    Necrotizing glomerulonephritis 06/04/2020   Essential hypertension, benign 02/07/2015   S/P PTCA (percutaneous transluminal coronary angioplasty) 11/09/2014   Pacemaker: Medtronic Advisa DR MRI [id number] Dual chamber pacemaker 10/08/2014 10/08/2014   Encounter for care of pacemaker 10/08/2014   Sinus node dysfunction (HCC) 10/08/2014   Diabetes mellitus with stage 3 chronic kidney disease (HCC) 10/07/2014   CAD (coronary artery disease), native coronary artery 10/07/2014   Atherosclerosis of native arteries of the extremities with gangrene (HCC) 12/16/2013   PVD (peripheral vascular disease) (HCC) 10/27/2013   Anemia 10/21/2013   Spinal stenosis, lumbar region, with neurogenic claudication 06/12/2012    Class: Diagnosis of   Diabetes mellitus with end stage renal disease (HCC) 03/15/2009   ALCOHOL ABUSE 03/15/2009   TOBACCO USE 03/15/2009   HYPERTENSION 03/15/2009   Past Medical History:  Diagnosis Date   Acute blood loss anemia    Alcohol abuse    Anemia    Anemia of chronic disease    Arthritis    "patient does not think so."   Atrial fibrillation (HCC)    Cardiac syncope 10/07/2014   rapid A fib with 8 sec pauses on converison with syncope- temp pacing wire placed then PPM   Cataract    BILATERAL   Coronary artery disease    with stents   Diabetes  mellitus    dx---been  awhile   Elevated LFTs    Endocarditis of tricuspid valve    ESRD (end stage renal disease) (HCC)    M/W/F   Gangrene of right foot (HCC)    GERD (gastroesophageal reflux disease)    History of blood transfusion    Hyperlipemia    Hypertension    Malnutrition (HCC)    Osteomyelitis (HCC)    right foot   Osteomyelitis of left foot (HCC) 08/19/2013   Peripheral vascular disease (HCC)    Pneumonia    Presence of permanent cardiac pacemaker 10/08/2014   Medtronic    Family History  Problem Relation Age of Onset   Diabetes type II Mother  Hypertension Mother    Diabetes Mother    Liver cancer Father    Diabetes type II Sister    Breast cancer Sister    Diabetes Sister    Hypertension Sister    Diabetes type II Brother    Kidney failure Brother    Diabetes Brother    Hypertension Brother    Diabetes type II Sister     Past Surgical History:  Procedure Laterality Date   A/V FISTULAGRAM Right 05/01/2021   Procedure: A/V FISTULAGRAM;  Surgeon: Maeola Harman, MD;  Location: [facility] INVASIVE CV LAB;  Service: Cardiovascular;  Laterality: Right;  failed fistulagram   ABDOMINAL AORTOGRAM W/LOWER EXTREMITY N/A 09/09/2017   Procedure: ABDOMINAL AORTOGRAM W/LOWER EXTREMITY;  Surgeon: Chuck Hint, MD;  Location: [facility] INVASIVE CV LAB;  Service: Cardiovascular;  Laterality: N/A;   ABDOMINAL AORTOGRAM W/LOWER EXTREMITY Right 11/11/2019   Procedure: ABDOMINAL AORTOGRAM W/LOWER EXTREMITY;  Surgeon: Cephus Shelling, MD;  Location: [facility] INVASIVE CV LAB;  Service: Cardiovascular;  Laterality: Right;   ABDOMINAL AORTOGRAM W/LOWER EXTREMITY N/A 01/26/2022   Procedure: ABDOMINAL AORTOGRAM W/LOWER EXTREMITY;  Surgeon: Yates Decamp, MD;  Location: [facility] INVASIVE CV LAB;  Service: Cardiovascular;  Laterality: N/A;   AMPUTATION Left 08/19/2013   Procedure: AMPUTATION RAY;  Surgeon: Harvie Junior, MD;  Location: [facility] OR;  Service: Orthopedics;  Laterality: Left;  ray  amputation left 5th   AMPUTATION Left 10/27/2013   Procedure: AMPUTATION DIGIT-LEFT 4TH TOE, 4th and 5th metatarsal.;  Surgeon: Chuck Hint, MD;  Location: [facility] OR;  Service: Vascular;  Laterality: Left;   AMPUTATION Left 11/04/2013   Procedure: LEFT FOOT TRANS-METATARSAL AMPUTATION WITH WOUND CLOSURE ;  Surgeon: Harvie Junior, MD;  Location: [facility] OR;  Service: Orthopedics;  Laterality: Left;   AMPUTATION Right 09/10/2017   Procedure: RIGHT FOURTH AND FIFTH TOE AMPUTATION;  Surgeon: Chuck Hint, MD;  Location: [facility] OR;  Service: Vascular;  Laterality: Right;   AMPUTATION Right 02/19/2020   Procedure: AMPUTATION RIGHT 3RD TOE;  Surgeon: Chuck Hint, MD;  Location: [facility] OR;  Service: Vascular;  Laterality: Right;   AMPUTATION Right 03/11/2020   Procedure: RIGHT BELOW KNEE AMPUTATION;  Surgeon: Nadara Mustard, MD;  Location: [facility] OR;  Service: Orthopedics;  Laterality: Right;   AMPUTATION Right 01/31/2022   Procedure: RIGHT ABOVE KNEE AMPUTATION;  Surgeon: Nadara Mustard, MD;  Location: [facility] OR;  Service: Orthopedics;  Laterality: Right;   AMPUTATION Left 05/11/2022   Procedure: LEFT BELOW KNEE AMPUTATION;  Surgeon: Nadara Mustard, MD;  Location: [facility] OR;  Service: Orthopedics;  Laterality: Left;   APPLICATION OF WOUND VAC Right 02/19/2020   Procedure: Application Of Wound Vac Right foot;  Surgeon: Chuck Hint, MD;  Location: [facility] OR;  Service: Vascular;  Laterality: Right;   APPLICATION OF WOUND VAC Left 05/11/2022   Procedure: APPLICATION OF WOUND VAC;  Surgeon: Nadara Mustard, MD;  Location: [facility] OR;  Service: Orthopedics;  Laterality: Left;   AV FISTULA PLACEMENT Right 03/27/2021   Procedure: RIGHT ARM ARTERIOVENOUS (AV) FISTULA;  Surgeon: Chuck Hint, MD;  Location: [facility] OR;  Service: Vascular;  Laterality: Right;   AV FISTULA PLACEMENT Right 05/10/2021   Procedure: RIGHT ARM ARTERIOVENOUS (AV) FISTULA;  Surgeon: Maeola Harman, MD;  Location: [facility] OR;   Service: Vascular;  Laterality: Right;   BACK SURGERY     BASCILIC VEIN TRANSPOSITION Right 08/11/2021   Procedure: RIGHT ARM SECOND STAGE BASILIC VEIN TRANSPOSITION;  Surgeon: Maeola Harman,  MD;  Location: [facility] OR;  Service: Vascular;  Laterality: Right;   BIOPSY  07/20/2020   Procedure: BIOPSY;  Surgeon: Meridee Score Netty Starring., MD;  Location: [facility] ENDOSCOPY;  Service: Gastroenterology;;   CARDIAC CATHETERIZATION  10/07/2014   Procedure: LEFT HEART CATH AND CORONARY ANGIOGRAPHY;  Surgeon: Pamella Pert, MD;  Location: [facility] CATH LAB;  Service: Cardiovascular;;   COLONOSCOPY W/ POLYPECTOMY     COLONOSCOPY WITH PROPOFOL N/A 05/25/2020   Procedure: COLONOSCOPY WITH PROPOFOL;  Surgeon: Iva Boop, MD;  Location: [facility] ENDOSCOPY;  Service: Endoscopy;  Laterality: N/A;   EMBOLECTOMY Right 09/12/2017   Procedure: Thrombectomy  & Redo Right Below Knee Popliteal Artery Bypass Graft  ;  Surgeon: Maeola Harman, MD;  Location: [facility] OR;  Service: Vascular;  Laterality: Right;   ENTEROSCOPY N/A 07/20/2020   Procedure: ENTEROSCOPY;  Surgeon: Meridee Score Netty Starring., MD;  Location: [facility] ENDOSCOPY;  Service: Gastroenterology;  Laterality: N/A;   ESOPHAGOGASTRODUODENOSCOPY (EGD) WITH PROPOFOL N/A 05/24/2020   Procedure: ESOPHAGOGASTRODUODENOSCOPY (EGD) WITH PROPOFOL;  Surgeon: Meryl Dare, MD;  Location: [facility] ENDOSCOPY;  Service: Endoscopy;  Laterality: N/A;   EYE SURGERY Bilateral    cataract   FEMORAL-TIBIAL BYPASS GRAFT Left 10/27/2013   Procedure: BYPASS GRAFT LEFT FEMORAL- POSTERIOR TIBIAL ARTERY;  Surgeon: Chuck Hint, MD;  Location: [facility] OR;  Service: Vascular;  Laterality: Left;   FEMORAL-TIBIAL BYPASS GRAFT Right 09/10/2017   Procedure: RIGHT SUPERFICIAL  FEMORAL ARTERY-BELOW KNEE POPLITEAL ARTERY BYPASS GRAFT WITH VEIN;  Surgeon: Chuck Hint, MD;  Location: [facility] OR;  Service: Vascular;  Laterality: Right;   GIVENS CAPSULE STUDY N/A 07/20/2020   Procedure:  GIVENS CAPSULE STUDY;  Surgeon: Lemar Lofty., MD;  Location: [facility] ENDOSCOPY;  Service: Gastroenterology;  Laterality: N/A;  capsule placed through scope into duodenum @ 0936   I & D EXTREMITY Left 10/27/2013   Procedure: IRRIGATION AND DEBRIDEMENT EXTREMITY- LEFT FOOT;  Surgeon: Chuck Hint, MD;  Location: [facility] OR;  Service: Vascular;  Laterality: Left;   I & D EXTREMITY Right 01/26/2020   Procedure: IRRIGATION AND DEBRIDEMENT EXTREMITY RIGHT FOOT WOUND;  Surgeon: Chuck Hint, MD;  Location: [facility] OR;  Service: Vascular;  Laterality: Right;   INSERT / REPLACE / REMOVE PACEMAKER     INSERTION OF DIALYSIS CATHETER Right 11/03/2021   Procedure: RIGHT INTERNAL JUGULAR INSERTION OF TUNNELED DIALYSIS CATHETER;  Surgeon: Chuck Hint, MD;  Location: [facility] OR;  Service: Vascular;  Laterality: Right;   INTRAOPERATIVE ARTERIOGRAM Right 09/10/2017   Procedure: INTRA OPERATIVE ARTERIOGRAM;  Surgeon: Chuck Hint, MD;  Location: [facility] OR;  Service: Vascular;  Laterality: Right;   IR ANGIOGRAM FOLLOW UP STUDY  09/09/2017   IR FLUORO GUIDE CV LINE RIGHT  05/31/2020   IR LUMBAR DISC ASPIRATION W/IMG GUIDE  01/19/2022   IR US GUIDE VASC ACCESS RIGHT  06/01/2020   LEFT HEART CATH AND CORONARY ANGIOGRAPHY N/A 08/19/2020   Procedure: LEFT HEART CATH AND CORONARY ANGIOGRAPHY;  Surgeon: Yates Decamp, MD;  Location: [facility] INVASIVE CV LAB;  Service: Cardiovascular;  Laterality: N/A;   LEFT HEART CATHETERIZATION WITH CORONARY ANGIOGRAM N/A 11/09/2014   Procedure: LEFT HEART CATHETERIZATION WITH CORONARY ANGIOGRAM;  Surgeon: Pamella Pert, MD;  Location: [facility] CATH LAB;  Service: Cardiovascular;  Laterality: N/A;   LOWER EXTREMITY ANGIOGRAM Right 11/08/2015   Procedure: Lower Extremity Angiogram;  Surgeon: Nada Libman, MD;  Location: [facility] INVASIVE CV LAB;  Service: Cardiovascular;  Laterality: Right;   LUMBAR LAMINECTOMY  06/13/2012  Procedure: MICRODISCECTOMY LUMBAR LAMINECTOMY;   Surgeon: Kerrin Champagne, MD;  Location: [facility] OR;  Service: Orthopedics;  Laterality: N/A;  Central laminectomy L2-3, L3-4, L4-5   PACEMAKER INSERTION  10/08/2014   MDT Advisa MRI compatible dual chamber pacemaker implanted by Dr Graciela Husbands for syncope with post-termination pauses   PERCUTANEOUS CORONARY STENT INTERVENTION (PCI-S)  11/09/2014   des to lad & distal circumflex         Dr  Jacinto Halim   PERIPHERAL VASCULAR BALLOON ANGIOPLASTY Right 11/11/2019   Procedure: PERIPHERAL VASCULAR BALLOON ANGIOPLASTY;  Surgeon: Cephus Shelling, MD;  Location: [facility] INVASIVE CV LAB;  Service: Cardiovascular;  Laterality: Right;  below knee popliteal, tibioperoneal trunk, posterior tibial arteries   PERIPHERAL VASCULAR CATHETERIZATION N/A 11/08/2015   Procedure: Abdominal Aortogram;  Surgeon: Nada Libman, MD;  Location: [facility] INVASIVE CV LAB;  Service: Cardiovascular;  Laterality: N/A;   PERIPHERAL VASCULAR CATHETERIZATION Right 11/08/2015   Procedure: Peripheral Vascular Atherectomy;  Surgeon: Nada Libman, MD;  Location: [facility] INVASIVE CV LAB;  Service: Cardiovascular;  Laterality: Right;   PERIPHERAL VASCULAR CATHETERIZATION N/A 10/10/2016   Procedure: Lower Extremity Angiography;  Surgeon: Maeola Harman, MD;  Location: [facility] INVASIVE CV LAB;  Service: Cardiovascular;  Laterality: N/A;   PERIPHERAL VASCULAR CATHETERIZATION Right 10/10/2016   Procedure: Peripheral Vascular Atherectomy;  Surgeon: Maeola Harman, MD;  Location: [facility] INVASIVE CV LAB;  Service: Cardiovascular;  Laterality: Right;  Popliteal   PERMANENT PACEMAKER INSERTION N/A 10/08/2014   Procedure: PERMANENT PACEMAKER INSERTION;  Surgeon: Duke Salvia, MD;  Location: [facility] CATH LAB;  Service: Cardiovascular;  Laterality: N/A;   RADIOLOGY WITH ANESTHESIA N/A 01/19/2022   Procedure: Disc Aspiration;  Surgeon: Baldemar Lenis, MD;  Location: [facility] OR;  Service: Radiology;  Laterality: N/A;   SUBMUCOSAL TATTOO INJECTION  07/20/2020    Procedure: SUBMUCOSAL TATTOO INJECTION;  Surgeon: Lemar Lofty., MD;  Location: [facility] ENDOSCOPY;  Service: Gastroenterology;;   TEE WITHOUT CARDIOVERSION N/A 10/27/2021   Procedure: TRANSESOPHAGEAL ECHOCARDIOGRAM (TEE);  Surgeon: Yates Decamp, MD;  Location: [facility] ENDOSCOPY;  Service: Cardiovascular;  Laterality: N/A;   TEMPORARY PACEMAKER INSERTION Bilateral 10/07/2014   Procedure: TEMPORARY PACEMAKER INSERTION;  Surgeon: Pamella Pert, MD;  Location: [facility] CATH LAB;  Service: Cardiovascular;  Laterality: Bilateral;   WOUND DEBRIDEMENT Right 02/19/2020   Procedure: DEBRIDEMENT WOUND RIGHT FOOT;  Surgeon: Chuck Hint, MD;  Location: [facility] OR;  Service: Vascular;  Laterality: Right;   Social History   Occupational History   Occupation: Disabled  Tobacco Use   Smoking status: Every Day    Types: Cigars    Passive exposure: Current   Smokeless tobacco: Never   Tobacco comments:    Smokes cigars daily, has not smoked cigarettes since 2014  Vaping Use   Vaping Use: Never used  Substance and Sexual Activity   Alcohol use: Not Currently    Alcohol/week: 21.0 standard drinks of alcohol    Types: 21 Glasses of wine per week    Comment: 1/2 to a whole beer once a week as of 05/10/22   Drug use: No   Sexual activity: Not on file

## 2023-04-26 DIAGNOSIS — D509 Iron deficiency anemia, unspecified: Secondary | ICD-10-CM | POA: Diagnosis not present

## 2023-04-26 DIAGNOSIS — N186 End stage renal disease: Secondary | ICD-10-CM | POA: Diagnosis not present

## 2023-04-26 DIAGNOSIS — Z992 Dependence on renal dialysis: Secondary | ICD-10-CM | POA: Diagnosis not present

## 2023-04-26 DIAGNOSIS — N2581 Secondary hyperparathyroidism of renal origin: Secondary | ICD-10-CM | POA: Diagnosis not present

## 2023-04-26 DIAGNOSIS — K31811 Angiodysplasia of stomach and duodenum with bleeding: Secondary | ICD-10-CM | POA: Diagnosis not present

## 2023-04-26 DIAGNOSIS — D631 Anemia in chronic kidney disease: Secondary | ICD-10-CM | POA: Diagnosis not present

## 2023-04-29 DIAGNOSIS — N186 End stage renal disease: Secondary | ICD-10-CM | POA: Diagnosis not present

## 2023-04-29 DIAGNOSIS — D509 Iron deficiency anemia, unspecified: Secondary | ICD-10-CM | POA: Diagnosis not present

## 2023-04-29 DIAGNOSIS — N2581 Secondary hyperparathyroidism of renal origin: Secondary | ICD-10-CM | POA: Diagnosis not present

## 2023-04-29 DIAGNOSIS — K31811 Angiodysplasia of stomach and duodenum with bleeding: Secondary | ICD-10-CM | POA: Diagnosis not present

## 2023-04-29 DIAGNOSIS — Z992 Dependence on renal dialysis: Secondary | ICD-10-CM | POA: Diagnosis not present

## 2023-04-29 DIAGNOSIS — D631 Anemia in chronic kidney disease: Secondary | ICD-10-CM | POA: Diagnosis not present

## 2023-04-30 ENCOUNTER — Ambulatory Visit: Payer: Medicare Other | Admitting: Cardiology

## 2023-04-30 ENCOUNTER — Encounter: Payer: Self-pay | Admitting: Cardiology

## 2023-04-30 VITALS — BP 138/70 | HR 62

## 2023-04-30 DIAGNOSIS — I495 Sick sinus syndrome: Secondary | ICD-10-CM | POA: Diagnosis not present

## 2023-04-30 DIAGNOSIS — I480 Paroxysmal atrial fibrillation: Secondary | ICD-10-CM | POA: Diagnosis not present

## 2023-04-30 DIAGNOSIS — Z950 Presence of cardiac pacemaker: Secondary | ICD-10-CM

## 2023-04-30 DIAGNOSIS — I2510 Atherosclerotic heart disease of native coronary artery without angina pectoris: Secondary | ICD-10-CM | POA: Diagnosis not present

## 2023-04-30 MED ORDER — AMIODARONE HCL 200 MG PO TABS: 100.0000 mg | ORAL_TABLET | Freq: Every day | ORAL | 1 refills | Status: DC

## 2023-04-30 NOTE — Progress Notes (Signed)
[name] Date of Birth: Feb 11, 1961 MRN: [id number] Primary Care Provider:Tisovec, Adelfa Koh, MD Primary Cardiologist: Yates Decamp, MD,  Chief Complaint  Patient presents with   Atrial Fibrillation   Hypertension   Coronary Artery Disease   sinus node dysfunction    HPI  Alexander [name] is a 62 y.o. Caucasian male patient with hypertension, hyperlipidemia, diabetes mellitus with end-stage renal disease on hemodialysis, coronary artery disease with atherectomy followed by stenting to LAD in 2021 and to the distal circumflex in 2015, sick sinus syndrome and also complete heart block and paroxysmal atrial fibrillation SP permanent Medtronic pacemaker implantation in 2015 , severe PAD leading to eventual right BKA and left AKA, has chronic endocarditis involving pacemaker wire and had a prolonged hospitalization in December 2022 which is now resolved clinically and by echo in Nov 2023.  He now presents for routine visit at 6 months.  He is presently doing well and feels the best he has in quite a while.  Since bilateral leg amputations, his health is improved significantly.  No fever or chills. He has no open wounds.  His daughter Florentina Addison present at bedside.     PAST MEDICAL HISTORY: Past Medical History:  Diagnosis Date   Alcohol abuse    Anemia    Anemia of chronic disease    Arthritis    "patient does not think so."   Atrial fibrillation (HCC)    Cataract    BILATERAL   Coronary artery disease    with stents   Diabetes mellitus    Elevated LFTs    Endocarditis of tricuspid valve    ESRD (end stage renal disease) (HCC)    M/W/F   GERD (gastroesophageal reflux disease)    History of blood transfusion    Hyperlipemia    Hypertension    Osteomyelitis (HCC)    right foot   Peripheral vascular disease (HCC)    Presence of permanent cardiac pacemaker 10/08/2014   Medtronic   FAMILY HISTORY: The patient's family history includes Breast cancer in his sister; Diabetes in his  brother, mother, and sister; Diabetes type II in his brother, mother, sister, and sister; Hypertension in his brother, mother, and sister; Kidney failure in his brother; Liver cancer in his father.   SOCIAL HISTORY:  Social History   Tobacco Use   Smoking status: Every Day    Types: Cigars    Passive exposure: Current   Smokeless tobacco: Never   Tobacco comments:    Smokes cigars daily  Substance Use Topics   Alcohol use: Not Currently    Alcohol/week: 12.0 standard drinks of alcohol    Types: 12 Cans of beer per week    Review of Systems  Cardiovascular:  Negative for chest pain, dyspnea on exertion and leg swelling.   PHYSICAL EXAM:    04/30/2023    9:19 AM 02/28/2023    8:56 AM 02/28/2023    8:47 AM  Vitals with BMI  Height   6\' 0"   Weight   180 lbs  BMI   24.41  Systolic 138 115 409  Diastolic 70 78 81  Pulse 62  100   Physical Exam Neck:     Vascular: No carotid bruit or JVD.  Cardiovascular:     Rate and Rhythm: Normal rate and regular rhythm.     Pulses:           Right popliteal pulse not accessible and left popliteal pulse not accessible.     Heart  sounds: Murmur heard.     Early systolic murmur is present with a grade of 2/6 at the apex.  Pulmonary:     Effort: Pulmonary effort is normal.     Breath sounds: Normal breath sounds.  Abdominal:     General: Bowel sounds are normal.     Palpations: Abdomen is soft.  Musculoskeletal:     Comments: Right AKA, Left BKA  Skin:    Capillary Refill: Capillary refill takes less than 2 seconds.    LABORATORY DATA:    Latest Ref Rng & Units 05/14/2022    1:14 PM 05/13/2022    1:04 AM 05/12/2022    1:41 AM  CBC  WBC 4.0 - 10.5 K/uL 9.0  8.8  8.6   Hemoglobin 13.0 - 17.0 g/dL 7.6  7.6  9.0   Hematocrit 39.0 - 52.0 % 25.0  25.6  28.6   Platelets 150 - 400 K/uL 122  126  117        Latest Ref Rng & Units 05/14/2022    1:14 PM 05/13/2022    1:04 AM 05/12/2022    1:41 AM  CMP  Glucose 70 - 99 mg/dL 865  784  696    BUN 8 - 23 mg/dL 24  13  17    Creatinine 0.61 - 1.24 mg/dL 2.95  2.84  1.32   Sodium 135 - 145 mmol/L 132  134  136   Potassium 3.5 - 5.1 mmol/L 3.6  3.2  3.4   Chloride 98 - 111 mmol/L 93  96  92   CO2 22 - 32 mmol/L 27  29  28    Calcium 8.9 - 10.3 mg/dL 8.1  8.0  8.2    Lab Results  Component Value Date   HGBA1C 6.6 (H) 05/11/2022   HGBA1C 6.8 (H) 10/29/2021   HGBA1C 5.5 05/23/2020   External Labs:   Cholesterol, total 114.000 m 10/17/2020 HDL 39.000 mg 10/17/2020 LDL 32.000 mg 10/17/2020 Triglycerides 214.000 m 10/17/2020  A1C 8.800 01/30/2021  10/06/2019: A1c 6.5%.  Serum glucose 136 mg, BUN 55, creatinine 2.6, eGFR 25/30 ML.  Potassium 5.4.  CMP otherwise normal.  HB 10.2/HCT 31.4, platelets 141.  Normal indicis.  Total cholesterol 95, triglycerides 98, HDL 34, LDL 41.  Non-HDL cholesterol 61.  PSA normal.  11/17/2020: BUN 32, creatinine 4.44, potassium 3.9, sodium 130  12/01/2020: Platelets 151, hemoglobin 9.6, hematocrit 28.4  Allergies   Allergies  Allergen Reactions   Cytoxan [Cyclophosphamide] Other (See Comments)    Pancytopenia   Pletal [Cilostazol] Palpitations and Other (See Comments)    "Can hear heart beating loudly". Other reaction(s): Unknown   Keflex [Cephalexin] Nausea And Vomiting    Other reaction(s): Unknown   Tylenol [Acetaminophen] Other (See Comments)    Patient has a cold sweat   Iron Sucrose     Other reaction(s): Unknown   Sulfa Antibiotics Rash    Other reaction(s): Unknown    Medications     Current Outpatient Medications:    apixaban (ELIQUIS) 5 MG TABS tablet, Take 1 tablet (5 mg total) by mouth 2 (two) times daily., Disp: 180 tablet, Rfl: 3   atorvastatin (LIPITOR) 20 MG tablet, Take 1 tablet by mouth once daily, Disp: 90 tablet, Rfl: 0   blood glucose meter kit and supplies KIT, Check blood sugar TID & QHS, Disp: 1 each, Rfl: 0   Blood Glucose Monitoring Suppl (ACCU-CHEK ADVANTAGE DIABETES) kit, Use as instructed, Disp: 1 each, Rfl:  0   furosemide (LASIX)  80 MG tablet, Take 80 mg by mouth See admin instructions. Take 80 mg by mouth on (non-dialysis days) Tuesdays, Thursdays, Saturdays & Sundays in the morning., Disp: , Rfl:    gabapentin (NEURONTIN) 300 MG capsule, Take 1 capsule (300 mg total) by mouth 3 (three) times daily. 3 times a day when necessary neuropathy pain (Patient taking differently: Take 300 mg by mouth as needed. 3 times a day when necessary neuropathy pain), Disp: 90 capsule, Rfl: 3   glucose blood (ACCU-CHEK AVIVA PLUS) test strip, Use as instructed, Disp: 100 each, Rfl: 12   glucose blood (CHOICE DM FORA G20 TEST STRIPS) test strip, Check blood sugar TID & QHS, Disp: 100 each, Rfl: 12   glucose blood test strip, Use as instructed, Disp: 100 each, Rfl: 12   insulin NPH-regular Human (NOVOLIN 70/30) (70-30) 100 UNIT/ML injection, Inject 0-20 Units into the skin. Sliding scale, Disp: , Rfl:    metoprolol tartrate (LOPRESSOR) 25 MG tablet, Take 1 tablet by mouth twice daily, Disp: 180 tablet, Rfl: 0   pantoprazole (PROTONIX) 40 MG tablet, Take 40 mg by mouth daily., Disp: , Rfl:    traMADol (ULTRAM) 50 MG tablet, Take 1 tablet (50 mg total) by mouth 2 (two) times daily as needed., Disp: 60 tablet, Rfl: 0   amiodarone (PACERONE) 200 MG tablet, Take 0.5 tablets (100 mg total) by mouth daily., Disp: 90 tablet, Rfl: 1   Cardiac Studies   Lexiscan myoview stress test 01/27/2016: 1. The resting electrocardiogram demonstrated normal sinus rhythm, normal resting conduction, no resting arrhythmias and normal rest repolarization. Stress EKG is non-diagnostic for ischemia as it a pharmacologic stress using Lexiscan. Stress symptoms included dyspnea. 2. The perfusion imaging study demonstrates a moderate-sized defect in the inferior wall extending from the base towards the apex, the defect slightly improved in stress images, suggests soft tissue attenuation. However scar in this region could not excluded. Left ventricular  systolic function Calculated by QGS was 72%. This is a low risk study. Clinical correlation is recommended.  Left Heart Catheterization 08/19/20:  Prior coronary angiogram: 11/09/2014: Diamondback CSI coronary atherectomy and stenting of the mid LAD-3.0 x 28 mm Promus Premier DES following. Distal dominant circumflex coronary artery following diamondback atherectomy and implantation of a 2.5 x 16 mm Promus Premier DES. Previously placed stents in LAD and CX patent. Mild to moderate diffuse disease in LAD, D1 and D2 and distal dominant Cx. RCA non dominant and severely diseased and small.   2015 mid LAD-3.0 x 28 mm Promus Premier DES in mid LAD and 2.5 x 16 mm Promus Premier DES in RI patent.  Peripheral arteriogram 01/26/2022: Abdominal angiogram reveals presence of 2 renal arteries 1 on either sides.  The kidneys are atrophic.  Abdominal aorta shows mild atherosclerotic changes and calcification.  Aortoiliac bifurcation is widely patent.  Right iliac artery and right common femoral artery are widely patent.    PCV ECHOCARDIOGRAM COMPLETE 09/06/2022  Mildly depressed LV systolic function with visual EF 45-50%. Left ventricle size is decreased. Severe concentric hypertrophy of the left ventricle. Hypokinetic global wall motion. Doppler evidence of grade II (pseudonormal) diastolic dysfunction, elevated LAP. Calculated EF 44%. Left atrial cavity is severely dilated at 48.15 ml/m^2. Right atrial cavity is moderately dilated. Pacemaker lead/ICD lead noted in the RV. Structurally normal mitral valve.  Mild (Grade I) mitral regurgitation. Structurally normal tricuspid valve.  Mild tricuspid regurgitation. No evidence of pulmonary hypertension. Pericardium is normal. Trace pericardial effusion. IVC is dilated with respiratory variation. Compared  to 01/20/2022, vegetations noted on the tricuspid valve and possibly pacemaker lead is no longer present.  Tricuspid regurgitation was severe  previously.  Pacemaker   Remote dual-chamber pacemaker transmission 03/03/2023: RV over sensing noted since 10/12/2022. Lead impedance and thresholds appear to be normal stable since July 2022.  R wave amplitude normal.   Longevity 1 year and 7 months.  AP 43%, VP 5%.  61 atrial monitoring episodes, EGM = atrial fibrillation.  Longest 4 hours.  AT/AF burden 8.9%.  1 high ventricular rate episode, 4 beat NSVT.  Normal pacemaker function.  EKG   EKG 04/30/2023: Atrially paced and ventricularly sensed rhythm at rate of 64 bpm, left axis deviation, left anterior fascicular block.  Right bundle branch block.  Bifascicular block.  Compared to 08/09/2022, atrial fibrillation with rapid ventricular response not present.   Assessment and plan:      ICD-10-CM   1. Sinus node dysfunction (HCC)  I49.5 EKG 12-Lead    2. Paroxysmal atrial fibrillation (HCC)  I48.0 EKG 12-Lead    amiodarone (PACERONE) 200 MG tablet    3. Atherosclerosis of native coronary artery of native heart without angina pectoris  I25.10     4. Pacemaker: Medtronic Advisa DR MRI [id number] Dual chamber pacemaker 10/08/2014  Z95.0       This patients CHA2DS2-VASc Score 3 (HTN, DM, vasc) and yearly risk of stroke 3.2%.   Meds ordered this encounter  Medications   amiodarone (PACERONE) 200 MG tablet    Sig: Take 0.5 tablets (100 mg total) by mouth daily.    Dispense:  90 tablet    Refill:  1    RECOMMENDATIONS:  Alexander [name] is a 62 y.o. Caucasian male patient with hypertension, hyperlipidemia, diabetes mellitus with end-stage renal disease on hemodialysis, coronary artery disease with atherectomy followed by stenting to LAD in 2021 and to the distal circumflex in 2015, sick sinus syndrome and also complete heart block and paroxysmal atrial fibrillation SP permanent Medtronic pacemaker implantation in 2015 , severe PAD leading to eventual right BKA and left AKA, has chronic endocarditis involving pacemaker wire and had a prolonged  hospitalization in December 2022 which is now resolved clinically and by echo in Nov 2023.  This is a 7-month office visit.  1. Sinus node dysfunction (HCC) Patient with sinus node dysfunction, he is not pacemaker dependent, has underlying PAF as well.  He is maintaining sinus rhythm on amiodarone. - EKG 12-Lead  2. Paroxysmal atrial fibrillation (HCC) PAF, 10% burden.  Pacemaker is functioning normally.  Will go ahead and reduce the dose of the amiodarone from 200 mg daily to 100 mg daily.  Will continue to monitor his pacemaker for atrial fibrillation burden and indeed if it goes beyond 10%, we could certainly go back to 12 mg dose. - EKG 12-Lead - amiodarone (PACERONE) 200 MG tablet; Take 0.5 tablets (100 mg total) by mouth daily.  Dispense: 90 tablet; Refill: 1  3. Atherosclerosis of native coronary artery of native heart without angina pectoris He remains angina free.  He is wondering whether he would be a candidate for transplant, advised him that if he were to quit smoking, we could certainly support him for kidney transplant as well.  4. Pacemaker: Medtronic Advisa DR MRI [id number] Dual chamber pacemaker 10/08/2014 Pacemaker is functioning normally.     Yates Decamp, MD, [facility] 04/30/2023, 9:57 AM Office: [phone] Fax: [phone] Pager: [phone]

## 2023-05-01 DIAGNOSIS — N2581 Secondary hyperparathyroidism of renal origin: Secondary | ICD-10-CM | POA: Diagnosis not present

## 2023-05-01 DIAGNOSIS — N186 End stage renal disease: Secondary | ICD-10-CM | POA: Diagnosis not present

## 2023-05-01 DIAGNOSIS — D631 Anemia in chronic kidney disease: Secondary | ICD-10-CM | POA: Diagnosis not present

## 2023-05-01 DIAGNOSIS — Z992 Dependence on renal dialysis: Secondary | ICD-10-CM | POA: Diagnosis not present

## 2023-05-01 DIAGNOSIS — D509 Iron deficiency anemia, unspecified: Secondary | ICD-10-CM | POA: Diagnosis not present

## 2023-05-01 DIAGNOSIS — K31811 Angiodysplasia of stomach and duodenum with bleeding: Secondary | ICD-10-CM | POA: Diagnosis not present

## 2023-05-03 DIAGNOSIS — N186 End stage renal disease: Secondary | ICD-10-CM | POA: Diagnosis not present

## 2023-05-03 DIAGNOSIS — K31811 Angiodysplasia of stomach and duodenum with bleeding: Secondary | ICD-10-CM | POA: Diagnosis not present

## 2023-05-03 DIAGNOSIS — D509 Iron deficiency anemia, unspecified: Secondary | ICD-10-CM | POA: Diagnosis not present

## 2023-05-03 DIAGNOSIS — N2581 Secondary hyperparathyroidism of renal origin: Secondary | ICD-10-CM | POA: Diagnosis not present

## 2023-05-03 DIAGNOSIS — D631 Anemia in chronic kidney disease: Secondary | ICD-10-CM | POA: Diagnosis not present

## 2023-05-03 DIAGNOSIS — Z992 Dependence on renal dialysis: Secondary | ICD-10-CM | POA: Diagnosis not present

## 2023-05-06 DIAGNOSIS — N2581 Secondary hyperparathyroidism of renal origin: Secondary | ICD-10-CM | POA: Diagnosis not present

## 2023-05-06 DIAGNOSIS — K31811 Angiodysplasia of stomach and duodenum with bleeding: Secondary | ICD-10-CM | POA: Diagnosis not present

## 2023-05-06 DIAGNOSIS — N186 End stage renal disease: Secondary | ICD-10-CM | POA: Diagnosis not present

## 2023-05-06 DIAGNOSIS — D509 Iron deficiency anemia, unspecified: Secondary | ICD-10-CM | POA: Diagnosis not present

## 2023-05-06 DIAGNOSIS — Z992 Dependence on renal dialysis: Secondary | ICD-10-CM | POA: Diagnosis not present

## 2023-05-06 DIAGNOSIS — D631 Anemia in chronic kidney disease: Secondary | ICD-10-CM | POA: Diagnosis not present

## 2023-05-08 DIAGNOSIS — N186 End stage renal disease: Secondary | ICD-10-CM | POA: Diagnosis not present

## 2023-05-08 DIAGNOSIS — N2581 Secondary hyperparathyroidism of renal origin: Secondary | ICD-10-CM | POA: Diagnosis not present

## 2023-05-08 DIAGNOSIS — K31811 Angiodysplasia of stomach and duodenum with bleeding: Secondary | ICD-10-CM | POA: Diagnosis not present

## 2023-05-08 DIAGNOSIS — D631 Anemia in chronic kidney disease: Secondary | ICD-10-CM | POA: Diagnosis not present

## 2023-05-08 DIAGNOSIS — Z992 Dependence on renal dialysis: Secondary | ICD-10-CM | POA: Diagnosis not present

## 2023-05-08 DIAGNOSIS — D509 Iron deficiency anemia, unspecified: Secondary | ICD-10-CM | POA: Diagnosis not present

## 2023-05-10 DIAGNOSIS — K31811 Angiodysplasia of stomach and duodenum with bleeding: Secondary | ICD-10-CM | POA: Diagnosis not present

## 2023-05-10 DIAGNOSIS — D631 Anemia in chronic kidney disease: Secondary | ICD-10-CM | POA: Diagnosis not present

## 2023-05-10 DIAGNOSIS — N2581 Secondary hyperparathyroidism of renal origin: Secondary | ICD-10-CM | POA: Diagnosis not present

## 2023-05-10 DIAGNOSIS — N186 End stage renal disease: Secondary | ICD-10-CM | POA: Diagnosis not present

## 2023-05-10 DIAGNOSIS — Z992 Dependence on renal dialysis: Secondary | ICD-10-CM | POA: Diagnosis not present

## 2023-05-10 DIAGNOSIS — D509 Iron deficiency anemia, unspecified: Secondary | ICD-10-CM | POA: Diagnosis not present

## 2023-05-12 DIAGNOSIS — N186 End stage renal disease: Secondary | ICD-10-CM | POA: Diagnosis not present

## 2023-05-12 DIAGNOSIS — Z992 Dependence on renal dialysis: Secondary | ICD-10-CM | POA: Diagnosis not present

## 2023-05-12 DIAGNOSIS — E1122 Type 2 diabetes mellitus with diabetic chronic kidney disease: Secondary | ICD-10-CM | POA: Diagnosis not present

## 2023-05-23 DIAGNOSIS — E1142 Type 2 diabetes mellitus with diabetic polyneuropathy: Secondary | ICD-10-CM | POA: Diagnosis not present

## 2023-05-30 DIAGNOSIS — I871 Compression of vein: Secondary | ICD-10-CM | POA: Diagnosis not present

## 2023-05-30 DIAGNOSIS — Z992 Dependence on renal dialysis: Secondary | ICD-10-CM | POA: Diagnosis not present

## 2023-05-30 DIAGNOSIS — N186 End stage renal disease: Secondary | ICD-10-CM | POA: Diagnosis not present

## 2023-06-02 DIAGNOSIS — I495 Sick sinus syndrome: Secondary | ICD-10-CM | POA: Diagnosis not present

## 2023-06-02 DIAGNOSIS — Z45018 Encounter for adjustment and management of other part of cardiac pacemaker: Secondary | ICD-10-CM | POA: Diagnosis not present

## 2023-06-10 ENCOUNTER — Emergency Department (HOSPITAL_COMMUNITY)
Admission: EM | Admit: 2023-06-10 | Discharge: 2023-06-13 | Disposition: E | Payer: Medicare Other | Attending: Emergency Medicine | Admitting: Emergency Medicine

## 2023-06-10 DIAGNOSIS — I469 Cardiac arrest, cause unspecified: Secondary | ICD-10-CM | POA: Insufficient documentation

## 2023-06-10 DIAGNOSIS — E1165 Type 2 diabetes mellitus with hyperglycemia: Secondary | ICD-10-CM | POA: Insufficient documentation

## 2023-06-10 DIAGNOSIS — E871 Hypo-osmolality and hyponatremia: Secondary | ICD-10-CM | POA: Diagnosis not present

## 2023-06-10 DIAGNOSIS — Z79899 Other long term (current) drug therapy: Secondary | ICD-10-CM | POA: Insufficient documentation

## 2023-06-10 DIAGNOSIS — Z950 Presence of cardiac pacemaker: Secondary | ICD-10-CM | POA: Insufficient documentation

## 2023-06-10 DIAGNOSIS — Z955 Presence of coronary angioplasty implant and graft: Secondary | ICD-10-CM | POA: Insufficient documentation

## 2023-06-10 DIAGNOSIS — N186 End stage renal disease: Secondary | ICD-10-CM | POA: Diagnosis not present

## 2023-06-10 DIAGNOSIS — Z89512 Acquired absence of left leg below knee: Secondary | ICD-10-CM | POA: Insufficient documentation

## 2023-06-10 DIAGNOSIS — Z89511 Acquired absence of right leg below knee: Secondary | ICD-10-CM | POA: Diagnosis not present

## 2023-06-10 DIAGNOSIS — E1122 Type 2 diabetes mellitus with diabetic chronic kidney disease: Secondary | ICD-10-CM | POA: Diagnosis not present

## 2023-06-10 DIAGNOSIS — Z794 Long term (current) use of insulin: Secondary | ICD-10-CM | POA: Diagnosis not present

## 2023-06-10 DIAGNOSIS — I120 Hypertensive chronic kidney disease with stage 5 chronic kidney disease or end stage renal disease: Secondary | ICD-10-CM | POA: Diagnosis not present

## 2023-06-10 DIAGNOSIS — I2510 Atherosclerotic heart disease of native coronary artery without angina pectoris: Secondary | ICD-10-CM | POA: Insufficient documentation

## 2023-06-10 DIAGNOSIS — Z7901 Long term (current) use of anticoagulants: Secondary | ICD-10-CM | POA: Insufficient documentation

## 2023-06-10 DIAGNOSIS — Z992 Dependence on renal dialysis: Secondary | ICD-10-CM | POA: Diagnosis not present

## 2023-06-10 LAB — CBG MONITORING, ED: Glucose-Capillary: 165 mg/dL — ABNORMAL HIGH (ref 70–99)

## 2023-06-10 LAB — I-STAT CHEM 8, ED
BUN: 64 mg/dL — ABNORMAL HIGH (ref 8–23)
Calcium, Ion: 0.99 mmol/L — ABNORMAL LOW (ref 1.15–1.40)
Chloride: 113 mmol/L — ABNORMAL HIGH (ref 98–111)
Creatinine, Ser: 6.9 mg/dL — ABNORMAL HIGH (ref 0.61–1.24)
Glucose, Bld: 209 mg/dL — ABNORMAL HIGH (ref 70–99)
HCT: 23 % — ABNORMAL LOW (ref 39.0–52.0)
Hemoglobin: 7.8 g/dL — ABNORMAL LOW (ref 13.0–17.0)
Potassium: 3.6 mmol/L (ref 3.5–5.1)
Sodium: 124 mmol/L — ABNORMAL LOW (ref 135–145)
TCO2: 16 mmol/L — ABNORMAL LOW (ref 22–32)

## 2023-06-13 NOTE — ED Provider Notes (Addendum)
Discussed with wife.  She reports that the patient was fine as of Saturday that he had a poor appetite.  Today he was sweaty and clammy and complaining of less chest pain. Patient not feeling well herself.  She was informed of his passing over the phone.  She voices understanding and did not have further questions at this time. Call Dr. Deneen Harts office.  Patient has previously been discharged to that practice Attempted to Discussed with nephrology. Patient does not appear to be ME case.  Nursing informed they could remove tubes. Margarita Grizzle, MD 05/14/2023 1150    Margarita Grizzle, MD 06/09/2023 [phone]

## 2023-06-13 NOTE — ED Notes (Signed)
1005 Patient arrived to [facility], CPR in progress per ems patient's wife called patient was c/o not feeling well for 3 days. C/o chest pain with nausea no vomiting. Patient is a M-W-F dialysis, last dialysis was Friday had a full dialysis treatment , per ems patient was loaded in the back of the truck and had a witnessed arrest per ems.  [phone] CPR was started patient was given 6 Epip. Calcium 1 gm, Sodium Bicarb 50 meq, Amino 450 mg   Patient had 6 shocks.   IO left humerus received 600cc NS, was V- Tach-defib- asystole.  1005 pulse check no pulse CPR continued Mag. 2 gm given.  1006 Epip X 1 IV  #18 angiocath started left A/C.  1007 intubated with #7.5 orally postive color change. CBG 165 1009 insulin 10 u IV , D50W x 1 IV  1011 bedside u/s no cardiac activity all ACLS efforts unsuccessful CPR discontinued.

## 2023-06-13 NOTE — ED Provider Notes (Signed)
[place] EMERGENCY DEPARTMENT AT [facility] Provider Note   CSN: [id number] Arrival date & time: 05/14/2023  1007     History  Chief Complaint  Patient presents with   Cardiac Arrest    TRI NOWLING is a 62 y.o. male.   Cardiac Arrest  Patient is a 62yM w/ PMHx of hypertension, hyperlipidemia, diabetes mellitus with end-stage renal disease on hemodialysis, coronary artery disease with atherectomy followed by stenting to LAD in 2021 and to the distal circumflex in 2015, sick sinus syndrome and also complete heart block and paroxysmal atrial fibrillation SP permanent Medtronic pacemaker implantation in 2015 , severe PAD leading to eventual right BKA and left AKA, has chronic endocarditis involving pacemaker wire and had a prolonged hospitalization in December 2022, presenting with EMS in cardiac arrest.  Per EMS, they were called out by the patient's wife for nausea and vomiting and not feeling well for the last 3 days.  He reported that he goes to dialysis on Mondays, Wednesdays, Fridays.  His last dialysis was 3 days ago and he had a full session.  He was alert and oriented upon arrival, however had a witnessed arrest while in transport.  CPR was initiated.  Patient received multiple rounds of epi prior to arrival as well as 1 g calcium, sodium bicarb, and amnio.  He underwent 6 shocks for pulseless V. tach.  Prior to arrival his rhythm was asystole. Upon arrival, patient is pulseless and still undergoing CPR.    Home Medications Prior to Admission medications   Medication Sig Start Date End Date Taking? Authorizing Provider  amiodarone (PACERONE) 200 MG tablet Take 0.5 tablets (100 mg total) by mouth daily. 04/30/23   Yates Decamp, MD  apixaban (ELIQUIS) 5 MG TABS tablet Take 1 tablet (5 mg total) by mouth 2 (two) times daily. 12/11/21 05/09/25  Cantwell, Celeste C, PA-C  atorvastatin (LIPITOR) 20 MG tablet Take 1 tablet by mouth once daily 02/11/23   Yates Decamp, MD  blood glucose  meter kit and supplies KIT Check blood sugar TID & QHS 02/10/15   Advani, Ayesha Rumpf, MD  Blood Glucose Monitoring Suppl (ACCU-CHEK ADVANTAGE DIABETES) kit Use as instructed 10/01/14   Doris Cheadle, MD  furosemide (LASIX) 80 MG tablet Take 80 mg by mouth See admin instructions. Take 80 mg by mouth on (non-dialysis days) Tuesdays, Thursdays, Saturdays & Sundays in the morning. 02/26/21   [provider]  gabapentin (NEURONTIN) 300 MG capsule Take 1 capsule (300 mg total) by mouth 3 (three) times daily. 3 times a day when necessary neuropathy pain Patient taking differently: Take 300 mg by mouth as needed. 3 times a day when necessary neuropathy pain 03/01/22   Nadara Mustard, MD  glucose blood (ACCU-CHEK AVIVA PLUS) test strip Use as instructed 10/01/14   Doris Cheadle, MD  glucose blood (CHOICE DM FORA G20 TEST STRIPS) test strip Check blood sugar TID & QHS 02/10/15   Advani, Ayesha Rumpf, MD  glucose blood test strip Use as instructed 11/20/13   Richarda Overlie, MD  insulin NPH-regular Human (NOVOLIN 70/30) (70-30) 100 UNIT/ML injection Inject 0-20 Units into the skin. Sliding scale 10/02/22   [provider]  metoprolol tartrate (LOPRESSOR) 25 MG tablet Take 1 tablet by mouth twice daily 08/17/22   Yates Decamp, MD  pantoprazole (PROTONIX) 40 MG tablet Take 40 mg by mouth daily.    [provider]  traMADol (ULTRAM) 50 MG tablet Take 1 tablet (50 mg total) by mouth 2 (two) times  daily as needed. 02/28/23   Jones Bales, NP      Allergies    Cytoxan [cyclophosphamide], Pletal [cilostazol], Keflex [cephalexin], Tylenol [acetaminophen], Iron sucrose, and Sulfa antibiotics    Review of Systems   Review of Systems  Unable to obtain 2/2 to medical acuity Physical Exam Updated Vital Signs Pulse (!) 0   Resp (!) 0   Ht 6' (1.829 m)   Wt 81.6 kg   BMI 24.41 kg/m  Physical Exam Constitutional:      Comments: Unresponsive, ashen  HENT:     Head: Normocephalic and atraumatic.      Comments: No evidence of facial trauma Eyes:     Conjunctiva/sclera: Conjunctivae normal.     Comments: 5 mm and nonreactive bilaterally  Cardiovascular:     Comments: Asystole and pulseless. Pulmonary:     Comments: Ventilating appropriately with BVM Abdominal:     Comments: Moderate abdominal distension. No evidence of trauma. No rigidity  Musculoskeletal:        General: No swelling or deformity.     Comments: Right BKA and left AKA  Skin:    Coloration: Skin is pale.     Comments: Ashen in color  Neurological:     Comments: Unresponsive, pupils 5mm equal and nonreactive. No purposeful movement     ED Results / Procedures / Treatments   Labs (all labs ordered are listed, but only abnormal results are displayed) Labs Reviewed  I-STAT CHEM 8, ED - Abnormal; Notable for the following components:      Result Value   Sodium 124 (*)    Chloride 113 (*)    BUN 64 (*)    Creatinine, Ser 6.90 (*)    Glucose, Bld 209 (*)    Calcium, Ion 0.99 (*)    TCO2 16 (*)    Hemoglobin 7.8 (*)    HCT 23.0 (*)    All other components within normal limits    EKG None  Radiology No results found.  Procedures Procedure Name: Intubation Date/Time: 05/14/2023 10:28 AM  Performed by: Antony Madura, DOPre-anesthesia Checklist: Patient identified and Patient being monitored Oxygen Delivery Method: Simple face mask Ventilation: Mask ventilation without difficulty Laryngoscope Size: Glidescope and 4 Grade View: Grade II Tube size: 7.5 mm Number of attempts: 1 Airway Equipment and Method: Video-laryngoscopy and Rigid stylet Placement Confirmation: ETT inserted through vocal cords under direct vision and CO2 detector Tube secured with: ETT holder       Medications Ordered in ED Medications - No data to display  ED Course/ Medical Decision Making/ A&P                             Medical Decision Making Problems Addressed: Cardiac arrest [facility]): complicated acute illness  or injury with systemic symptoms that poses a threat to life or bodily functions  Amount and/or Complexity of Data Reviewed Independent Historian: EMS External Data Reviewed: labs and notes. Labs: ordered. ECG/medicine tests: ordered.  Risk Prescription drug management. Decision regarding hospitalization. Decision not to resuscitate or to de-escalate care because of poor prognosis.    Patient is a 62yM w/ complicated past medical history further described above, presenting with EMS in cardiac arrest.  On initial exam, patient is unresponsive.  Pupils are 5 mm equal and nonreactive.  He is undergoing chest compressions via [place] device.  He has been adequately ventilated with BVM.  He is making no purposeful movements.  Pulse check  performed on arrival, confirming continued pulselessness.  Rhythm is asystole.  Patient was given 2 g magnesium.  Patient was given dose of epi IV push.  Resuscitation continued. He continued to demonstrate no significant changes.  Rhythm continues to demonstrate asystole.  Patient was intubated as described above.  No acute complications noted.  Cause of cardiac arrest, remains undetermined, however considered ACS, electrolyte derangement, respiratory failure secondary to pulmonary edema, and arrhythmia.  Symptoms could also have been related to infectious etiology such as pneumonia, UTI, or intra-abdominal process, however these were not as likely based on history obtained briefly from family per EMS.  No historical evidence or exam evidence of traumatic cause of arrest.  Given concern for possible hyperkalemia, patient was getting additional insulin with D50.  Review of i-STAT Chem-8 reveals mild hyponatremia 124.  Creatinine is down to 9.  Hemoglobin is low at 7.8, but unlikely to be related to the specific etiology of current presentation.  At this point, patient has been undergoing CPR from [phone].  Chest compressions were paused.  No pulses were  palpable.  Ultrasound used to evaluate for cardiac activity.  Parasternal view reveals no cardiac activity present.  Asystole was present on the monitor. TOD was called at 1011.  Patient's wife presented to the ED following TOD.  The case and efforts taken by EMS as well as the ED were discussed. She notes that he was having chest pain this morning, increasing the likelihood of ACS vs arrhythmia. She states understanding.    Final Clinical Impression(s) / ED Diagnoses Final diagnoses:  Cardiac arrest [facility])    Rx / DC Orders ED Discharge Orders     None         Antony Madura, DO 05/21/2023 1250    Margarita Grizzle, MD 06/12/23 1240

## 2023-06-13 NOTE — ED Notes (Signed)
[address] called  [phone] spoke with Marica Otter

## 2023-06-13 DEATH — deceased

## 2023-08-22 ENCOUNTER — Ambulatory Visit: Payer: Medicare Other | Admitting: Registered Nurse

## 2023-11-14 ENCOUNTER — Ambulatory Visit: Payer: Medicare Other | Admitting: Cardiology
